# Patient Record
Sex: Female | Born: 1965 | Race: White | Hispanic: No | Smoking: Never smoker
Health system: Southern US, Community
[De-identification: ages and names within clinical notes are randomized; demographics above are authoritative.]

## PROBLEM LIST (undated history)

## (undated) DIAGNOSIS — F319 Bipolar disorder, unspecified: Secondary | ICD-10-CM

## (undated) DIAGNOSIS — M1990 Unspecified osteoarthritis, unspecified site: Secondary | ICD-10-CM

## (undated) DIAGNOSIS — R079 Chest pain, unspecified: Secondary | ICD-10-CM

## (undated) DIAGNOSIS — R519 Headache, unspecified: Secondary | ICD-10-CM

## (undated) DIAGNOSIS — R569 Unspecified convulsions: Secondary | ICD-10-CM

## (undated) DIAGNOSIS — F1910 Other psychoactive substance abuse, uncomplicated: Secondary | ICD-10-CM

## (undated) DIAGNOSIS — I10 Essential (primary) hypertension: Secondary | ICD-10-CM

## (undated) DIAGNOSIS — J45909 Unspecified asthma, uncomplicated: Secondary | ICD-10-CM

## (undated) DIAGNOSIS — R55 Syncope and collapse: Secondary | ICD-10-CM

## (undated) DIAGNOSIS — F32A Depression, unspecified: Secondary | ICD-10-CM

## (undated) DIAGNOSIS — K219 Gastro-esophageal reflux disease without esophagitis: Secondary | ICD-10-CM

## (undated) DIAGNOSIS — F419 Anxiety disorder, unspecified: Secondary | ICD-10-CM

## (undated) DIAGNOSIS — Z9289 Personal history of other medical treatment: Secondary | ICD-10-CM

## (undated) DIAGNOSIS — G4730 Sleep apnea, unspecified: Secondary | ICD-10-CM

## (undated) DIAGNOSIS — R7303 Prediabetes: Secondary | ICD-10-CM

## (undated) DIAGNOSIS — F329 Major depressive disorder, single episode, unspecified: Secondary | ICD-10-CM

## (undated) HISTORY — DX: Unspecified asthma, uncomplicated: J45.909

## (undated) HISTORY — PX: OTHER SURGICAL HISTORY: SHX169

## (undated) HISTORY — DX: Other psychoactive substance abuse, uncomplicated: F19.10

## (undated) HISTORY — PX: APPENDECTOMY: SHX54

## (undated) HISTORY — DX: Personal history of other medical treatment: Z92.89

## (undated) HISTORY — DX: Chest pain, unspecified: R07.9

## (undated) HISTORY — PX: PLANTAR FASCIA RELEASE: SHX2239

## (undated) HISTORY — DX: Unspecified osteoarthritis, unspecified site: M19.90

## (undated) HISTORY — PX: CARPAL TUNNEL RELEASE: SHX101

## (undated) HISTORY — PX: SPINE SURGERY: SHX786

## (undated) HISTORY — PX: ABDOMINAL HYSTERECTOMY: SHX81

---

## 1997-09-04 ENCOUNTER — Emergency Department (HOSPITAL_COMMUNITY): Admission: EM | Admit: 1997-09-04 | Discharge: 1997-09-04 | Payer: Self-pay | Admitting: Emergency Medicine

## 1997-11-15 ENCOUNTER — Emergency Department (HOSPITAL_COMMUNITY): Admission: EM | Admit: 1997-11-15 | Discharge: 1997-11-15 | Payer: Self-pay | Admitting: Emergency Medicine

## 1998-02-22 ENCOUNTER — Inpatient Hospital Stay (HOSPITAL_COMMUNITY): Admission: EM | Admit: 1998-02-22 | Discharge: 1998-02-23 | Payer: Self-pay | Admitting: Emergency Medicine

## 1998-08-07 ENCOUNTER — Emergency Department (HOSPITAL_COMMUNITY): Admission: EM | Admit: 1998-08-07 | Discharge: 1998-08-07 | Payer: Self-pay | Admitting: Emergency Medicine

## 1998-08-07 ENCOUNTER — Encounter: Payer: Self-pay | Admitting: *Deleted

## 1998-08-25 ENCOUNTER — Encounter: Payer: Self-pay | Admitting: Neurosurgery

## 1998-08-27 ENCOUNTER — Encounter: Payer: Self-pay | Admitting: Neurosurgery

## 1998-08-27 ENCOUNTER — Observation Stay (HOSPITAL_COMMUNITY): Admission: RE | Admit: 1998-08-27 | Discharge: 1998-08-28 | Payer: Self-pay | Admitting: Neurosurgery

## 1998-09-24 ENCOUNTER — Inpatient Hospital Stay (HOSPITAL_COMMUNITY): Admission: AD | Admit: 1998-09-24 | Discharge: 1998-09-29 | Payer: Self-pay | Admitting: Psychiatry

## 1999-06-22 ENCOUNTER — Encounter (INDEPENDENT_AMBULATORY_CARE_PROVIDER_SITE_OTHER): Payer: Self-pay | Admitting: Specialist

## 1999-06-22 ENCOUNTER — Encounter: Payer: Self-pay | Admitting: Emergency Medicine

## 1999-06-22 ENCOUNTER — Inpatient Hospital Stay (HOSPITAL_COMMUNITY): Admission: EM | Admit: 1999-06-22 | Discharge: 1999-06-25 | Payer: Self-pay | Admitting: Emergency Medicine

## 1999-07-22 ENCOUNTER — Encounter: Payer: Self-pay | Admitting: Family Medicine

## 1999-07-22 ENCOUNTER — Ambulatory Visit (HOSPITAL_COMMUNITY): Admission: RE | Admit: 1999-07-22 | Discharge: 1999-07-22 | Payer: Self-pay | Admitting: Family Medicine

## 1999-07-23 ENCOUNTER — Other Ambulatory Visit: Admission: RE | Admit: 1999-07-23 | Discharge: 1999-07-23 | Payer: Self-pay | Admitting: Obstetrics and Gynecology

## 1999-07-30 ENCOUNTER — Encounter: Admission: RE | Admit: 1999-07-30 | Discharge: 1999-07-30 | Payer: Self-pay | Admitting: Gastroenterology

## 1999-07-30 ENCOUNTER — Encounter: Payer: Self-pay | Admitting: Gastroenterology

## 1999-12-18 ENCOUNTER — Ambulatory Visit (HOSPITAL_COMMUNITY): Admission: RE | Admit: 1999-12-18 | Discharge: 1999-12-18 | Payer: Self-pay | Admitting: *Deleted

## 2000-02-25 ENCOUNTER — Encounter: Payer: Self-pay | Admitting: Emergency Medicine

## 2000-02-25 ENCOUNTER — Emergency Department (HOSPITAL_COMMUNITY): Admission: EM | Admit: 2000-02-25 | Discharge: 2000-02-25 | Payer: Self-pay | Admitting: Emergency Medicine

## 2000-06-17 ENCOUNTER — Encounter: Payer: Self-pay | Admitting: Emergency Medicine

## 2000-06-17 ENCOUNTER — Emergency Department (HOSPITAL_COMMUNITY): Admission: EM | Admit: 2000-06-17 | Discharge: 2000-06-17 | Payer: Self-pay | Admitting: Emergency Medicine

## 2000-09-24 ENCOUNTER — Emergency Department (HOSPITAL_COMMUNITY): Admission: EM | Admit: 2000-09-24 | Discharge: 2000-09-24 | Payer: Self-pay | Admitting: Emergency Medicine

## 2000-09-25 ENCOUNTER — Inpatient Hospital Stay (HOSPITAL_COMMUNITY): Admission: EM | Admit: 2000-09-25 | Discharge: 2000-10-05 | Payer: Self-pay | Admitting: *Deleted

## 2000-12-05 ENCOUNTER — Ambulatory Visit (HOSPITAL_BASED_OUTPATIENT_CLINIC_OR_DEPARTMENT_OTHER): Admission: RE | Admit: 2000-12-05 | Discharge: 2000-12-05 | Payer: Self-pay | Admitting: Family Medicine

## 2000-12-06 ENCOUNTER — Encounter: Payer: Self-pay | Admitting: Emergency Medicine

## 2000-12-06 ENCOUNTER — Emergency Department (HOSPITAL_COMMUNITY): Admission: EM | Admit: 2000-12-06 | Discharge: 2000-12-06 | Payer: Self-pay | Admitting: Emergency Medicine

## 2000-12-27 ENCOUNTER — Emergency Department (HOSPITAL_COMMUNITY): Admission: EM | Admit: 2000-12-27 | Discharge: 2000-12-27 | Payer: Self-pay | Admitting: Emergency Medicine

## 2000-12-27 ENCOUNTER — Encounter: Payer: Self-pay | Admitting: Emergency Medicine

## 2001-01-19 ENCOUNTER — Ambulatory Visit (HOSPITAL_COMMUNITY): Admission: RE | Admit: 2001-01-19 | Discharge: 2001-01-19 | Payer: Self-pay | Admitting: Cardiology

## 2001-01-26 ENCOUNTER — Encounter: Payer: Self-pay | Admitting: Emergency Medicine

## 2001-01-26 ENCOUNTER — Inpatient Hospital Stay (HOSPITAL_COMMUNITY): Admission: EM | Admit: 2001-01-26 | Discharge: 2001-01-27 | Payer: Self-pay | Admitting: Emergency Medicine

## 2001-02-01 ENCOUNTER — Encounter: Admission: RE | Admit: 2001-02-01 | Discharge: 2001-02-01 | Payer: Self-pay | Admitting: Sports Medicine

## 2001-02-15 ENCOUNTER — Other Ambulatory Visit: Admission: RE | Admit: 2001-02-15 | Discharge: 2001-02-15 | Payer: Self-pay | Admitting: Family Medicine

## 2001-02-22 ENCOUNTER — Ambulatory Visit (HOSPITAL_BASED_OUTPATIENT_CLINIC_OR_DEPARTMENT_OTHER): Admission: RE | Admit: 2001-02-22 | Discharge: 2001-02-22 | Payer: Self-pay | Admitting: *Deleted

## 2001-04-03 ENCOUNTER — Encounter: Payer: Self-pay | Admitting: Emergency Medicine

## 2001-04-03 ENCOUNTER — Emergency Department (HOSPITAL_COMMUNITY): Admission: EM | Admit: 2001-04-03 | Discharge: 2001-04-04 | Payer: Self-pay | Admitting: Emergency Medicine

## 2001-08-05 ENCOUNTER — Inpatient Hospital Stay (HOSPITAL_COMMUNITY): Admission: AD | Admit: 2001-08-05 | Discharge: 2001-08-22 | Payer: Self-pay | Admitting: *Deleted

## 2001-11-21 ENCOUNTER — Encounter: Payer: Self-pay | Admitting: Family Medicine

## 2001-11-21 ENCOUNTER — Encounter: Admission: RE | Admit: 2001-11-21 | Discharge: 2001-11-21 | Payer: Self-pay | Admitting: Family Medicine

## 2002-01-15 ENCOUNTER — Encounter: Payer: Self-pay | Admitting: Family Medicine

## 2002-01-15 ENCOUNTER — Encounter: Admission: RE | Admit: 2002-01-15 | Discharge: 2002-01-15 | Payer: Self-pay | Admitting: Family Medicine

## 2002-01-16 ENCOUNTER — Encounter: Payer: Self-pay | Admitting: Emergency Medicine

## 2002-01-16 ENCOUNTER — Emergency Department (HOSPITAL_COMMUNITY): Admission: EM | Admit: 2002-01-16 | Discharge: 2002-01-16 | Payer: Self-pay | Admitting: Emergency Medicine

## 2002-02-08 ENCOUNTER — Encounter: Admission: RE | Admit: 2002-02-08 | Discharge: 2002-02-08 | Payer: Self-pay | Admitting: Family Medicine

## 2002-02-08 ENCOUNTER — Encounter: Payer: Self-pay | Admitting: Family Medicine

## 2002-02-22 ENCOUNTER — Encounter: Admission: RE | Admit: 2002-02-22 | Discharge: 2002-02-22 | Payer: Self-pay | Admitting: Family Medicine

## 2002-02-22 ENCOUNTER — Encounter: Payer: Self-pay | Admitting: Family Medicine

## 2002-04-20 ENCOUNTER — Encounter: Admission: RE | Admit: 2002-04-20 | Discharge: 2002-04-20 | Payer: Self-pay | Admitting: Family Medicine

## 2002-04-20 ENCOUNTER — Encounter: Payer: Self-pay | Admitting: Family Medicine

## 2002-05-02 ENCOUNTER — Encounter: Admission: RE | Admit: 2002-05-02 | Discharge: 2002-05-08 | Payer: Self-pay | Admitting: Family Medicine

## 2002-11-05 ENCOUNTER — Encounter: Payer: Self-pay | Admitting: Orthopedic Surgery

## 2002-11-07 ENCOUNTER — Encounter: Payer: Self-pay | Admitting: Orthopedic Surgery

## 2002-11-07 ENCOUNTER — Inpatient Hospital Stay (HOSPITAL_COMMUNITY): Admission: RE | Admit: 2002-11-07 | Discharge: 2002-11-09 | Payer: Self-pay | Admitting: Orthopaedic Surgery

## 2002-12-28 ENCOUNTER — Emergency Department (HOSPITAL_COMMUNITY): Admission: EM | Admit: 2002-12-28 | Discharge: 2002-12-28 | Payer: Self-pay | Admitting: Emergency Medicine

## 2003-02-18 ENCOUNTER — Emergency Department (HOSPITAL_COMMUNITY): Admission: EM | Admit: 2003-02-18 | Discharge: 2003-02-18 | Payer: Self-pay | Admitting: Emergency Medicine

## 2003-03-05 ENCOUNTER — Encounter: Admission: RE | Admit: 2003-03-05 | Discharge: 2003-03-29 | Payer: Self-pay | Admitting: Orthopedic Surgery

## 2003-03-12 ENCOUNTER — Encounter: Admission: RE | Admit: 2003-03-12 | Discharge: 2003-03-12 | Payer: Self-pay | Admitting: Sports Medicine

## 2003-03-28 ENCOUNTER — Emergency Department (HOSPITAL_COMMUNITY): Admission: EM | Admit: 2003-03-28 | Discharge: 2003-03-28 | Payer: Self-pay | Admitting: Emergency Medicine

## 2003-04-09 ENCOUNTER — Encounter
Admission: RE | Admit: 2003-04-09 | Discharge: 2003-07-08 | Payer: Self-pay | Admitting: Physical Medicine and Rehabilitation

## 2003-06-08 ENCOUNTER — Inpatient Hospital Stay (HOSPITAL_COMMUNITY): Admission: EM | Admit: 2003-06-08 | Discharge: 2003-06-10 | Payer: Self-pay | Admitting: Emergency Medicine

## 2003-06-10 ENCOUNTER — Inpatient Hospital Stay (HOSPITAL_COMMUNITY): Admission: EM | Admit: 2003-06-10 | Discharge: 2003-06-18 | Payer: Self-pay | Admitting: Psychiatry

## 2003-06-13 ENCOUNTER — Encounter: Admission: RE | Admit: 2003-06-13 | Discharge: 2003-06-13 | Payer: Self-pay | Admitting: Family Medicine

## 2003-10-13 ENCOUNTER — Emergency Department (HOSPITAL_COMMUNITY): Admission: EM | Admit: 2003-10-13 | Discharge: 2003-10-13 | Payer: Self-pay | Admitting: *Deleted

## 2003-12-03 ENCOUNTER — Inpatient Hospital Stay (HOSPITAL_COMMUNITY): Admission: RE | Admit: 2003-12-03 | Discharge: 2003-12-12 | Payer: Self-pay | Admitting: Psychiatry

## 2004-01-17 ENCOUNTER — Ambulatory Visit: Payer: Self-pay | Admitting: Internal Medicine

## 2004-01-23 ENCOUNTER — Emergency Department (HOSPITAL_COMMUNITY): Admission: EM | Admit: 2004-01-23 | Discharge: 2004-01-23 | Payer: Self-pay | Admitting: Emergency Medicine

## 2004-06-15 ENCOUNTER — Inpatient Hospital Stay (HOSPITAL_COMMUNITY): Admission: EM | Admit: 2004-06-15 | Discharge: 2004-06-17 | Payer: Self-pay | Admitting: Emergency Medicine

## 2004-06-17 ENCOUNTER — Inpatient Hospital Stay (HOSPITAL_COMMUNITY): Admission: RE | Admit: 2004-06-17 | Discharge: 2004-06-24 | Payer: Self-pay | Admitting: Psychiatry

## 2004-06-17 ENCOUNTER — Ambulatory Visit: Payer: Self-pay | Admitting: Psychiatry

## 2004-08-26 ENCOUNTER — Emergency Department (HOSPITAL_COMMUNITY): Admission: EM | Admit: 2004-08-26 | Discharge: 2004-08-26 | Payer: Self-pay | Admitting: Emergency Medicine

## 2004-09-05 IMAGING — CT CT HEAD W/O CM
1 of 2 series · 13 of 30 positions shown, 17 images · non-contrast
Comparison: none

CLINICAL DATA: Syncope.  Altered level of consciousness.  
 COMPUTERIZED CRANIAL TOMOGRAPHY WITHOUT CONTRAST 06/08/03
 Routine noncontrast head CT was performed. 

 There is no evidence of intracranial hemorrhage, brain edema, or mass effect. The ventricles are normal. No extraaxial abnormalities are identified. Bone windows show no significant abnormalities.
 IMPRESSION
 Negative noncontrast head CT.

[Series 2: brain · axial · 0.47mm/px · z∈[+122,+244]mm · 13 of 28 slices shown, 17 images]
[im 2/28  brain]
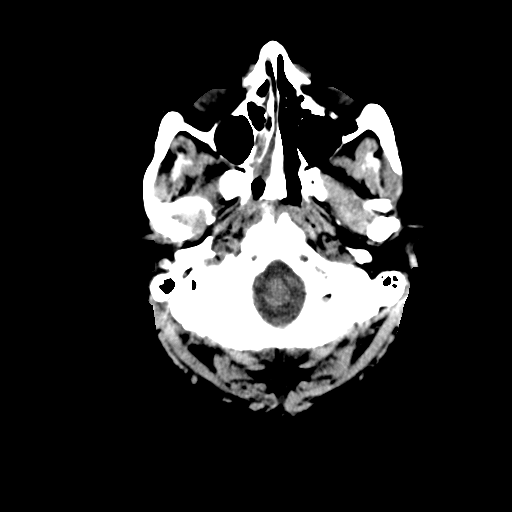
[im 2/28  bone]
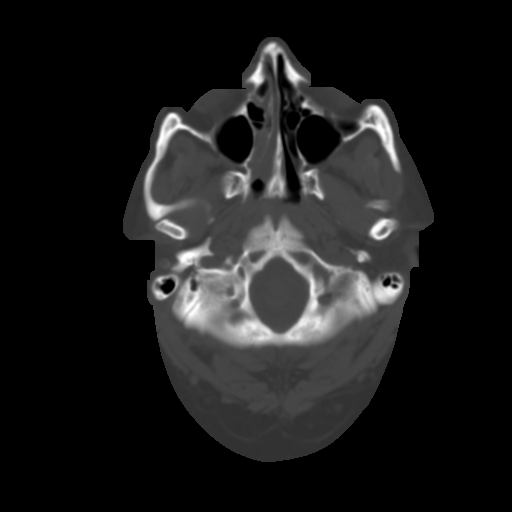
[im 4/28  brain]
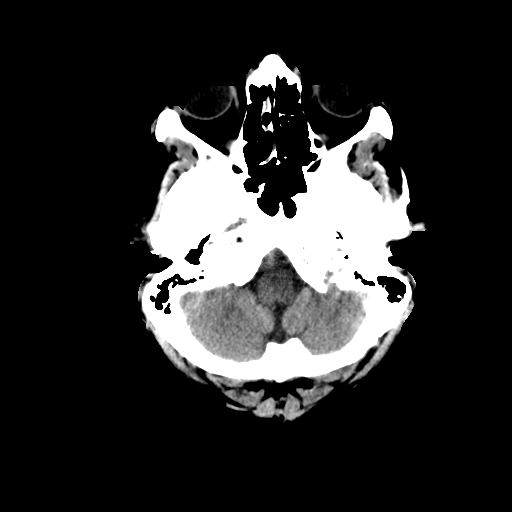
[im 6/28  brain]
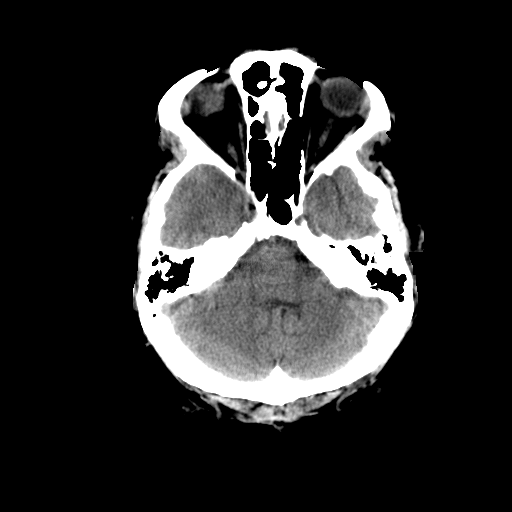
[im 8/28  brain]
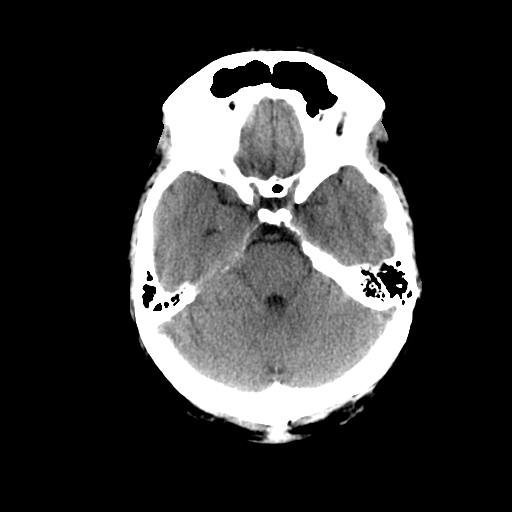
[im 10/28  brain]
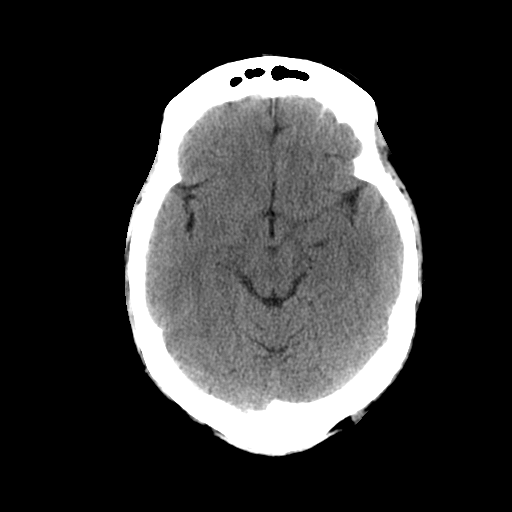
[im 10/28  bone]
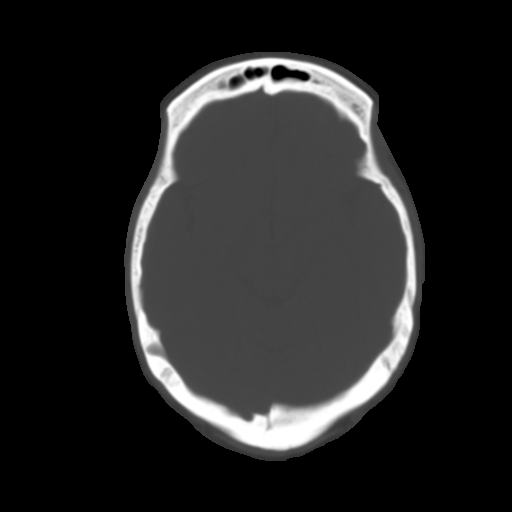
[im 12/28  brain]
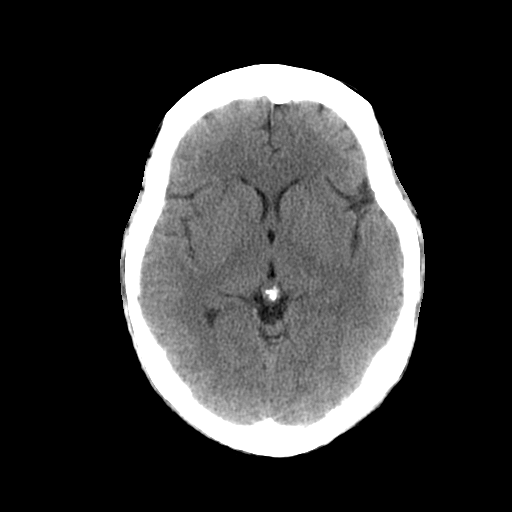
[im 14/28  brain]
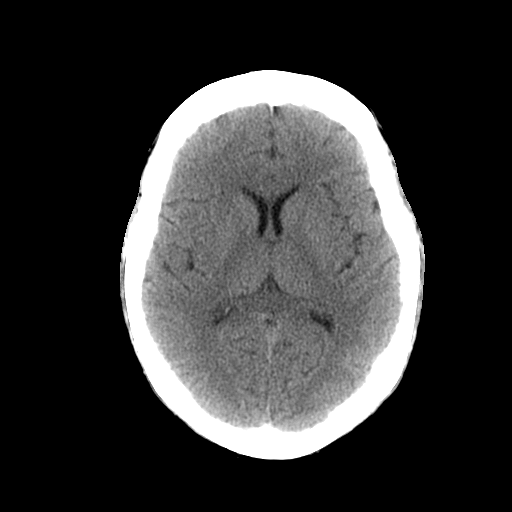
[im 16/28  brain]
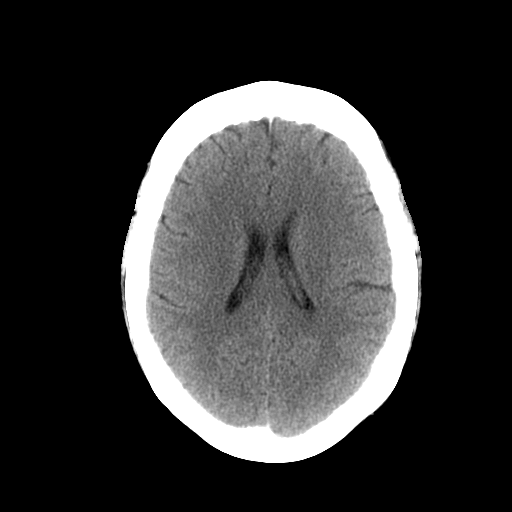
[im 18/28  brain]
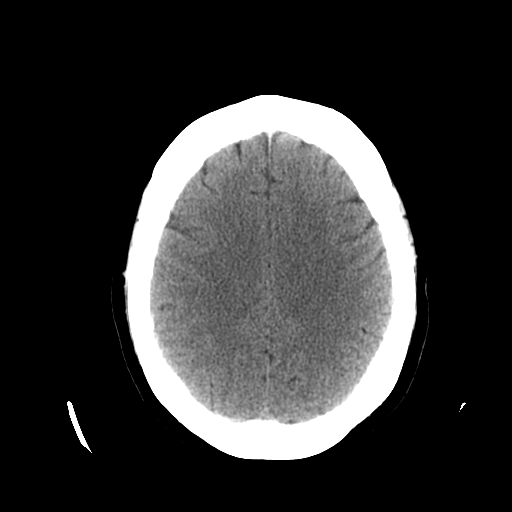
[im 18/28  bone]
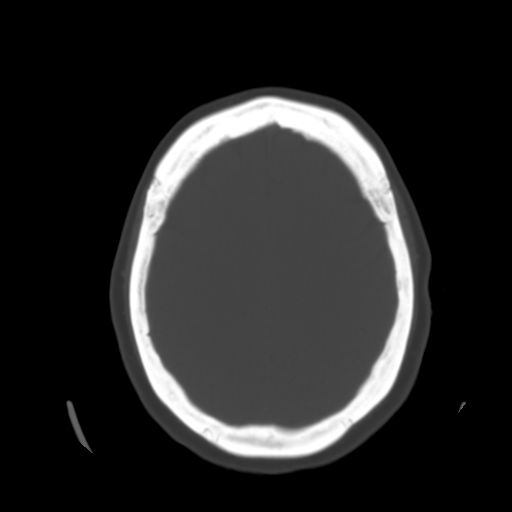
[im 20/28  brain]
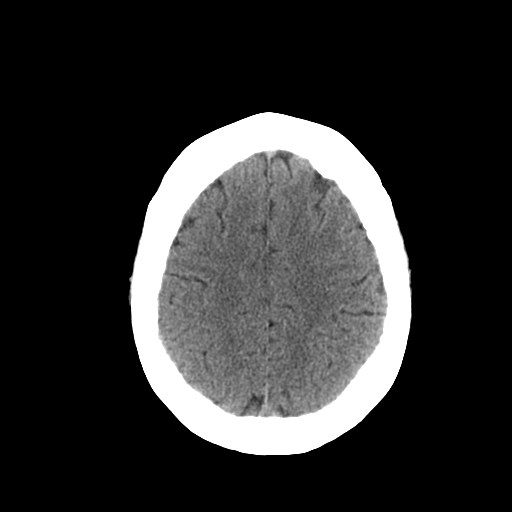
[im 22/28  brain]
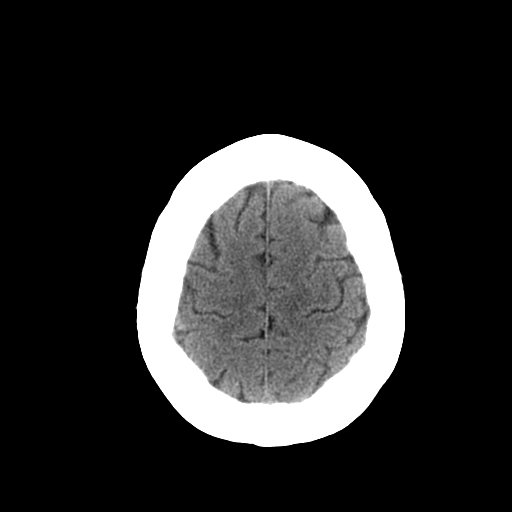
[im 24/28  brain]
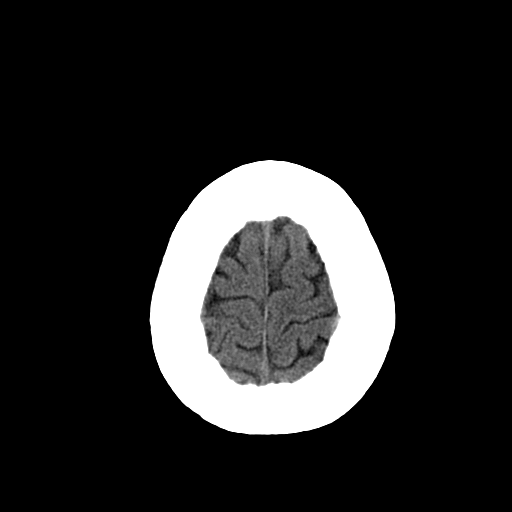
[im 26/28  brain]
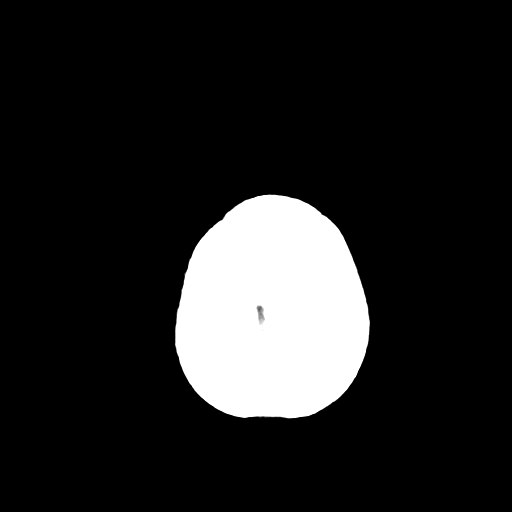
[im 26/28  bone]
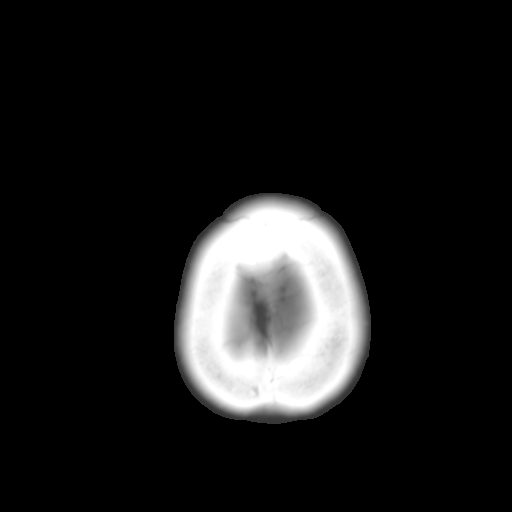

[13 of 30 positions shown; findings below may reference images not displayed]

## 2004-11-27 ENCOUNTER — Ambulatory Visit: Payer: Self-pay | Admitting: Internal Medicine

## 2004-12-10 ENCOUNTER — Ambulatory Visit: Payer: Self-pay | Admitting: Cardiology

## 2004-12-11 ENCOUNTER — Ambulatory Visit: Payer: Self-pay | Admitting: Internal Medicine

## 2005-01-05 ENCOUNTER — Ambulatory Visit: Payer: Self-pay | Admitting: Internal Medicine

## 2005-01-07 ENCOUNTER — Emergency Department (HOSPITAL_COMMUNITY): Admission: EM | Admit: 2005-01-07 | Discharge: 2005-01-07 | Payer: Self-pay | Admitting: Emergency Medicine

## 2005-01-07 ENCOUNTER — Ambulatory Visit (HOSPITAL_COMMUNITY): Admission: RE | Admit: 2005-01-07 | Discharge: 2005-01-07 | Payer: Self-pay | Admitting: Internal Medicine

## 2005-05-03 ENCOUNTER — Emergency Department (HOSPITAL_COMMUNITY): Admission: EM | Admit: 2005-05-03 | Discharge: 2005-05-03 | Payer: Self-pay | Admitting: Family Medicine

## 2005-05-25 ENCOUNTER — Emergency Department (HOSPITAL_COMMUNITY): Admission: EM | Admit: 2005-05-25 | Discharge: 2005-05-25 | Payer: Self-pay | Admitting: Emergency Medicine

## 2005-06-01 ENCOUNTER — Ambulatory Visit: Payer: Self-pay | Admitting: Gastroenterology

## 2005-06-08 ENCOUNTER — Encounter (INDEPENDENT_AMBULATORY_CARE_PROVIDER_SITE_OTHER): Payer: Self-pay | Admitting: Specialist

## 2005-06-08 ENCOUNTER — Ambulatory Visit: Payer: Self-pay | Admitting: Gastroenterology

## 2005-07-05 ENCOUNTER — Ambulatory Visit: Payer: Self-pay | Admitting: Gastroenterology

## 2005-07-17 ENCOUNTER — Inpatient Hospital Stay (HOSPITAL_COMMUNITY): Admission: EM | Admit: 2005-07-17 | Discharge: 2005-07-28 | Payer: Self-pay | Admitting: Psychiatry

## 2005-07-17 ENCOUNTER — Ambulatory Visit: Payer: Self-pay | Admitting: Psychiatry

## 2005-07-21 ENCOUNTER — Emergency Department (HOSPITAL_COMMUNITY): Admission: EM | Admit: 2005-07-21 | Discharge: 2005-07-21 | Payer: Self-pay | Admitting: Emergency Medicine

## 2005-10-03 ENCOUNTER — Emergency Department (HOSPITAL_COMMUNITY): Admission: EM | Admit: 2005-10-03 | Discharge: 2005-10-03 | Payer: Self-pay | Admitting: Emergency Medicine

## 2005-12-09 ENCOUNTER — Inpatient Hospital Stay (HOSPITAL_COMMUNITY): Admission: RE | Admit: 2005-12-09 | Discharge: 2005-12-13 | Payer: Self-pay | Admitting: Orthopaedic Surgery

## 2006-01-16 ENCOUNTER — Emergency Department (HOSPITAL_COMMUNITY): Admission: EM | Admit: 2006-01-16 | Discharge: 2006-01-16 | Payer: Self-pay | Admitting: Emergency Medicine

## 2006-01-18 ENCOUNTER — Inpatient Hospital Stay (HOSPITAL_COMMUNITY): Admission: EM | Admit: 2006-01-18 | Discharge: 2006-01-19 | Payer: Self-pay | Admitting: Psychiatry

## 2006-01-18 ENCOUNTER — Ambulatory Visit: Payer: Self-pay | Admitting: Psychiatry

## 2006-03-10 IMAGING — CT CT PARANASAL SINUSES LIMITED
1 of 2 series · 16 of 25 positions shown, 20 images · non-contrast
Comparison: none

CLINICAL DATA: Sinus congestion and nasal drainage.  Headaches.  Cough.
CT OF PARANASAL SINUSES:
TECHNIQUE: oronal CT images were obtained through the paranasal sinuses without intravenous contrast.
Minimal mucosal thickening is noted along the floors of both maxillary sinuses and involving several left anterior ethmoid air cells.  The sinuses are otherwise clear.  There is no evidence of air-fluid levels.  No bone abnormalities are seen involving the paranasal sinuses.

[Series 4: ltd sinus 3.0 h30s · axial · 0.29mm/px · z∈[-103,-9]mm · 16 of 24 slices shown, 20 images]
[im 2/24  brain]
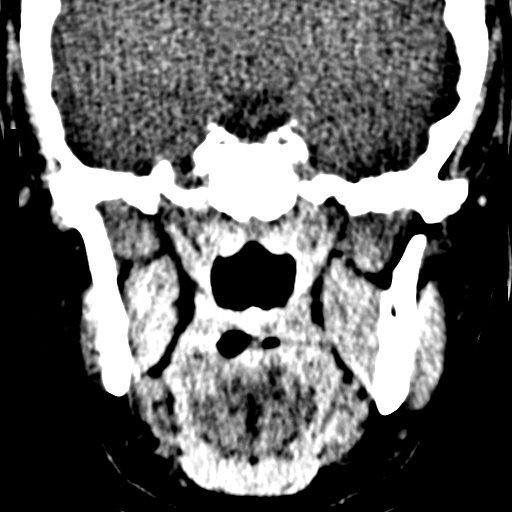
[im 2/24  bone]
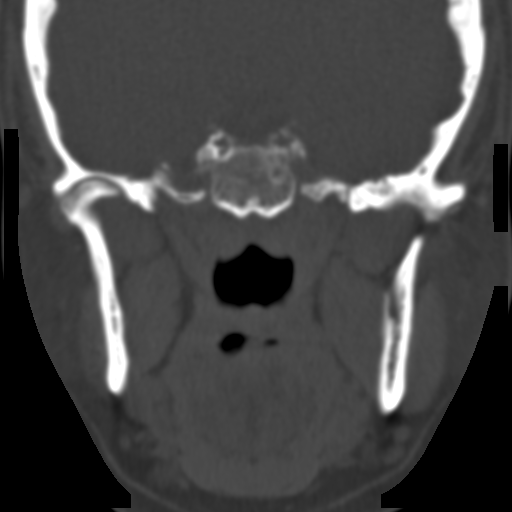
[im 4/24  bone]
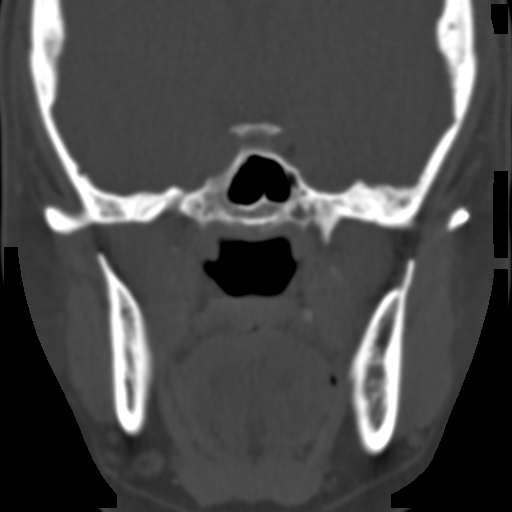
[im 5/24  bone]
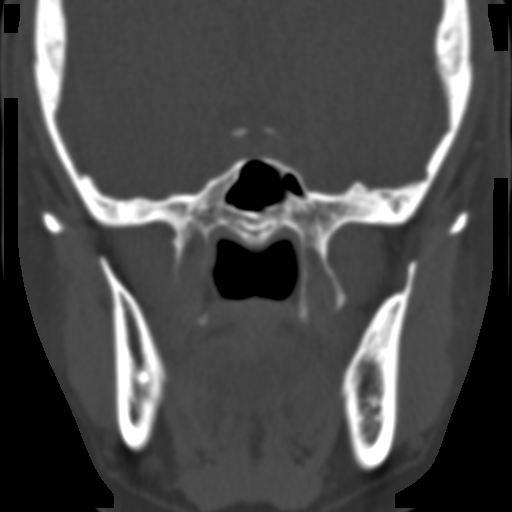
[im 6/24  bone]
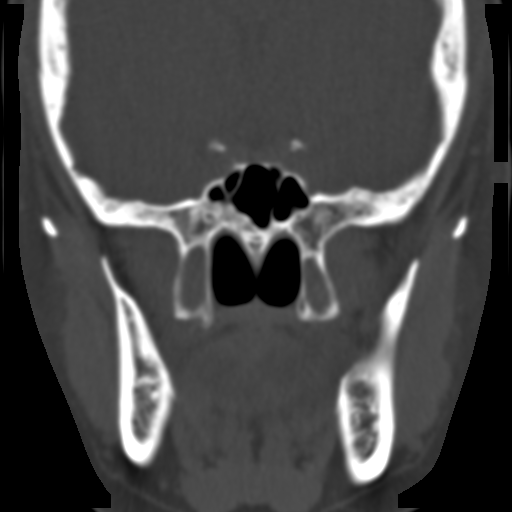
[im 8/24  brain]
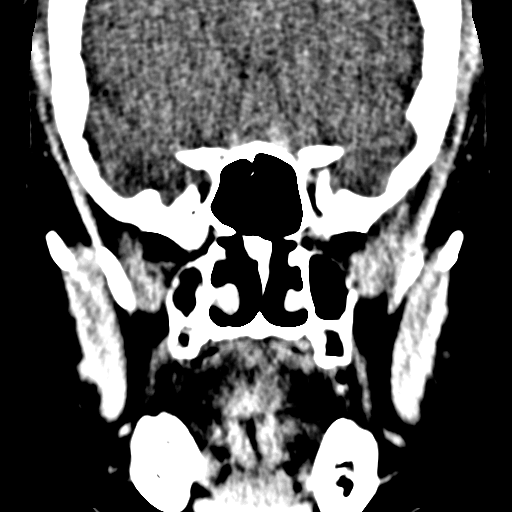
[im 8/24  bone]
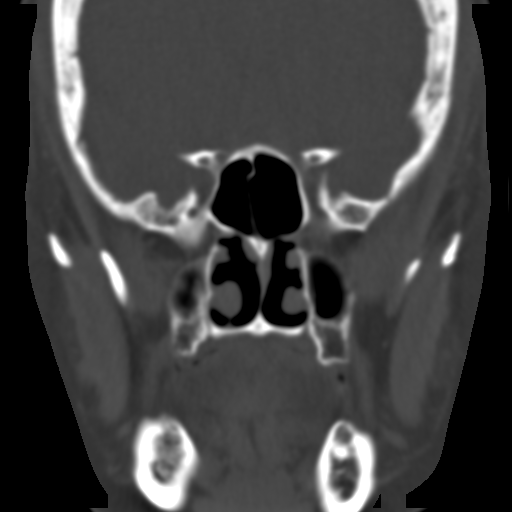
[im 9/24  bone]
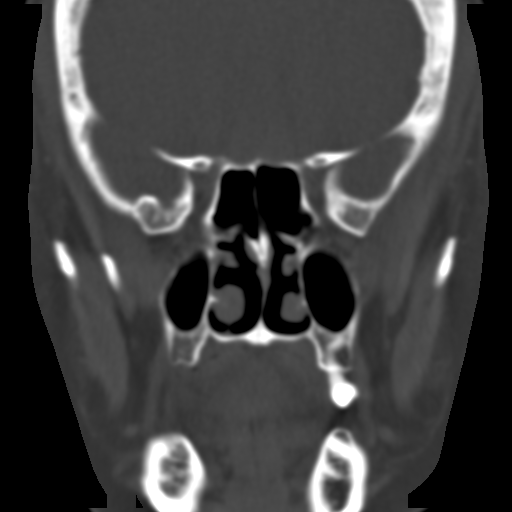
[im 10/24  bone]
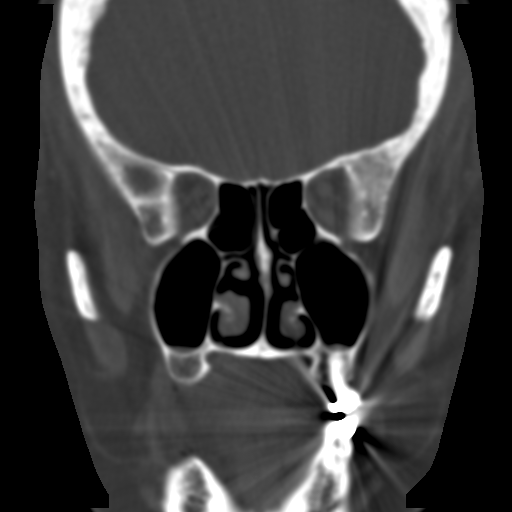
[im 12/24  bone]
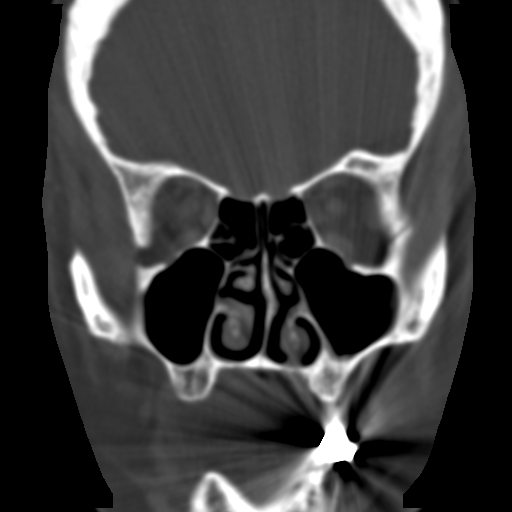
[im 13/24  brain]
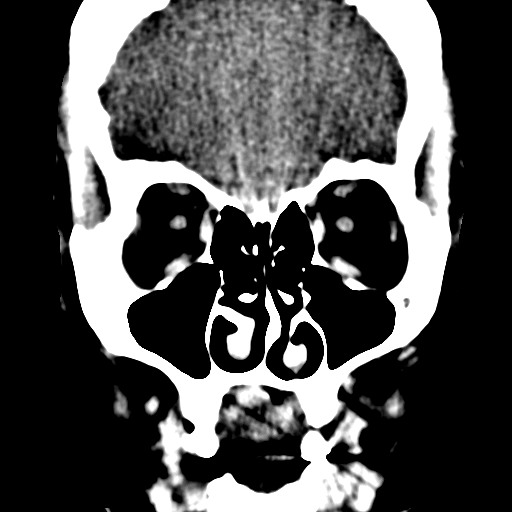
[im 13/24  bone]
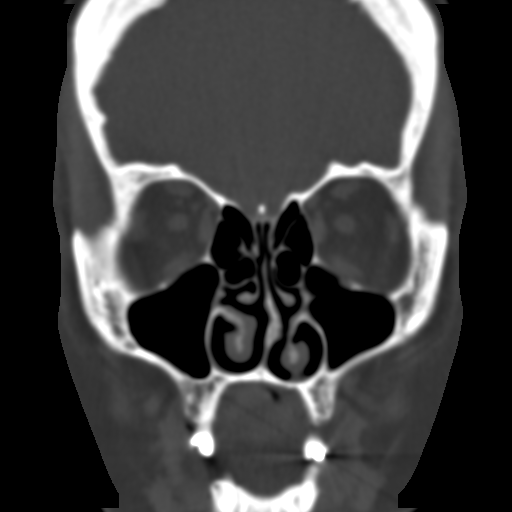
[im 15/24  bone]
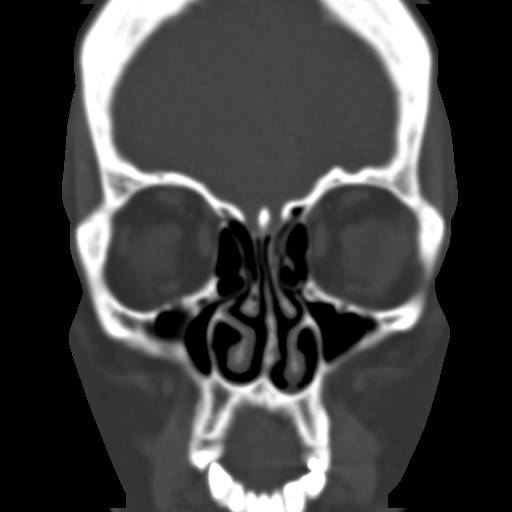
[im 16/24  bone]
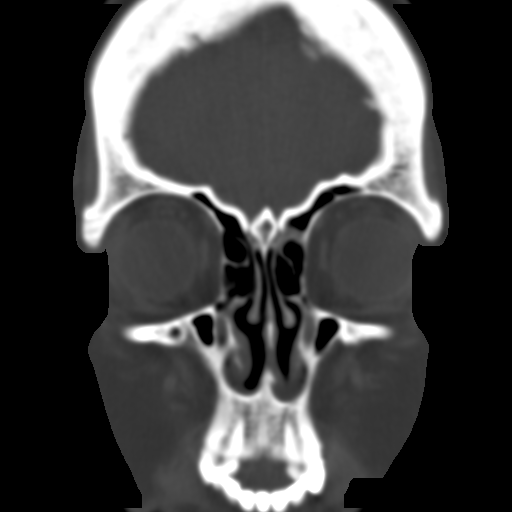
[im 17/24  bone]
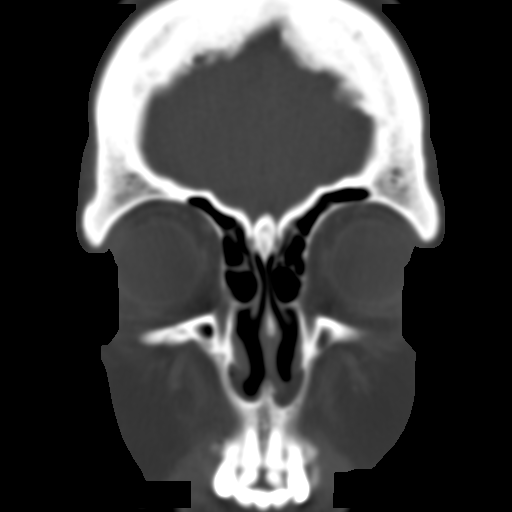
[im 19/24  brain]
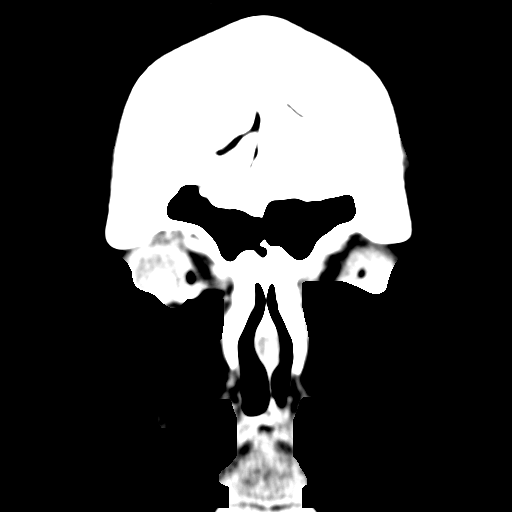
[im 19/24  bone]
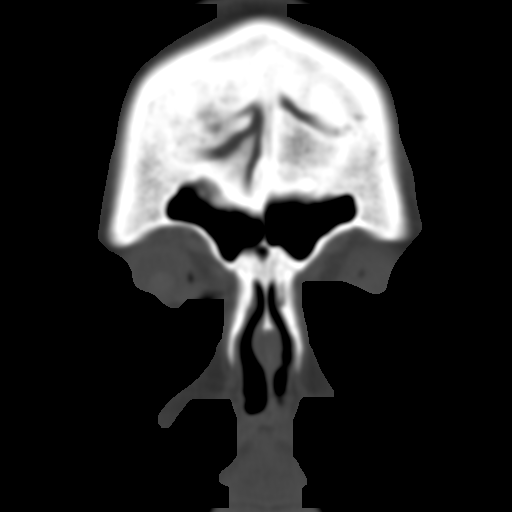
[im 20/24  bone]
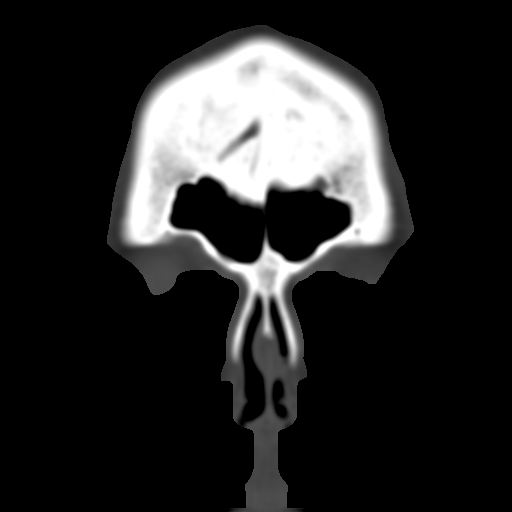
[im 21/24  bone]
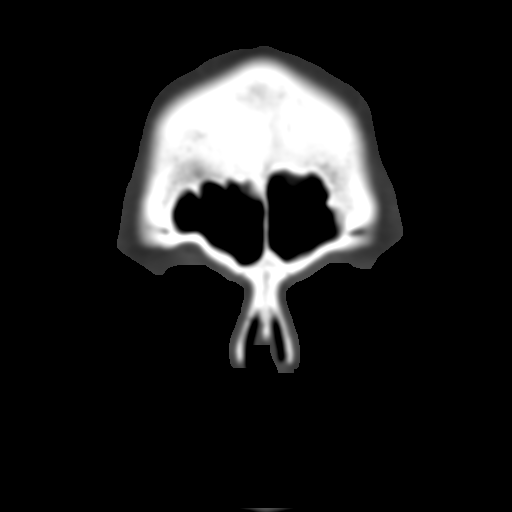
[im 23/24  bone]
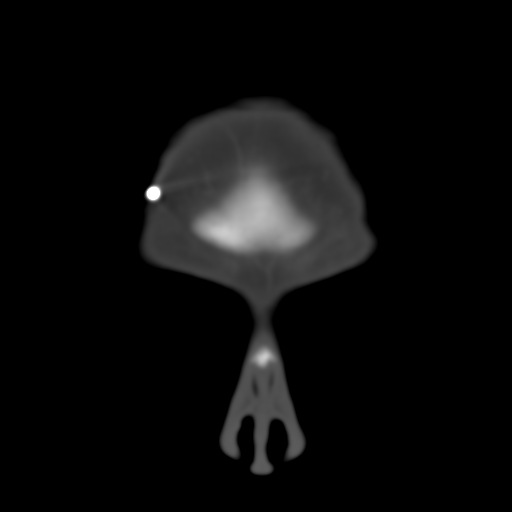

[16 of 25 positions shown; findings below may reference images not displayed]

IMPRESSION: Minimal mucosal thickening along the floors of both maxillary sinuses and involving several of the left anterior ethmoid air cells.

## 2006-03-13 ENCOUNTER — Emergency Department: Payer: Self-pay | Admitting: Emergency Medicine

## 2006-03-15 ENCOUNTER — Emergency Department (HOSPITAL_COMMUNITY): Admission: EM | Admit: 2006-03-15 | Discharge: 2006-03-15 | Payer: Self-pay | Admitting: Emergency Medicine

## 2006-03-18 ENCOUNTER — Inpatient Hospital Stay (HOSPITAL_COMMUNITY): Admission: EM | Admit: 2006-03-18 | Discharge: 2006-03-24 | Payer: Self-pay | Admitting: Emergency Medicine

## 2006-03-19 ENCOUNTER — Ambulatory Visit: Payer: Self-pay | Admitting: *Deleted

## 2006-06-10 ENCOUNTER — Emergency Department (HOSPITAL_COMMUNITY): Admission: EM | Admit: 2006-06-10 | Discharge: 2006-06-10 | Payer: Self-pay | Admitting: Emergency Medicine

## 2006-06-19 ENCOUNTER — Other Ambulatory Visit: Payer: Self-pay

## 2006-06-19 ENCOUNTER — Emergency Department: Payer: Self-pay | Admitting: Emergency Medicine

## 2006-06-29 ENCOUNTER — Emergency Department (HOSPITAL_COMMUNITY): Admission: EM | Admit: 2006-06-29 | Discharge: 2006-06-29 | Payer: Self-pay | Admitting: Emergency Medicine

## 2006-08-23 IMAGING — CR DG CHEST 1V PORT
1 series · 1 of 1 positions shown · non-contrast
Comparison: none

CLINICAL DATA: Chest pain for three hours.  Shortness of breath. 
 PORTABLE CHEST ? 1 VIEW ? 05/25/05 AT 7630 HOURS:

[view not recorded]
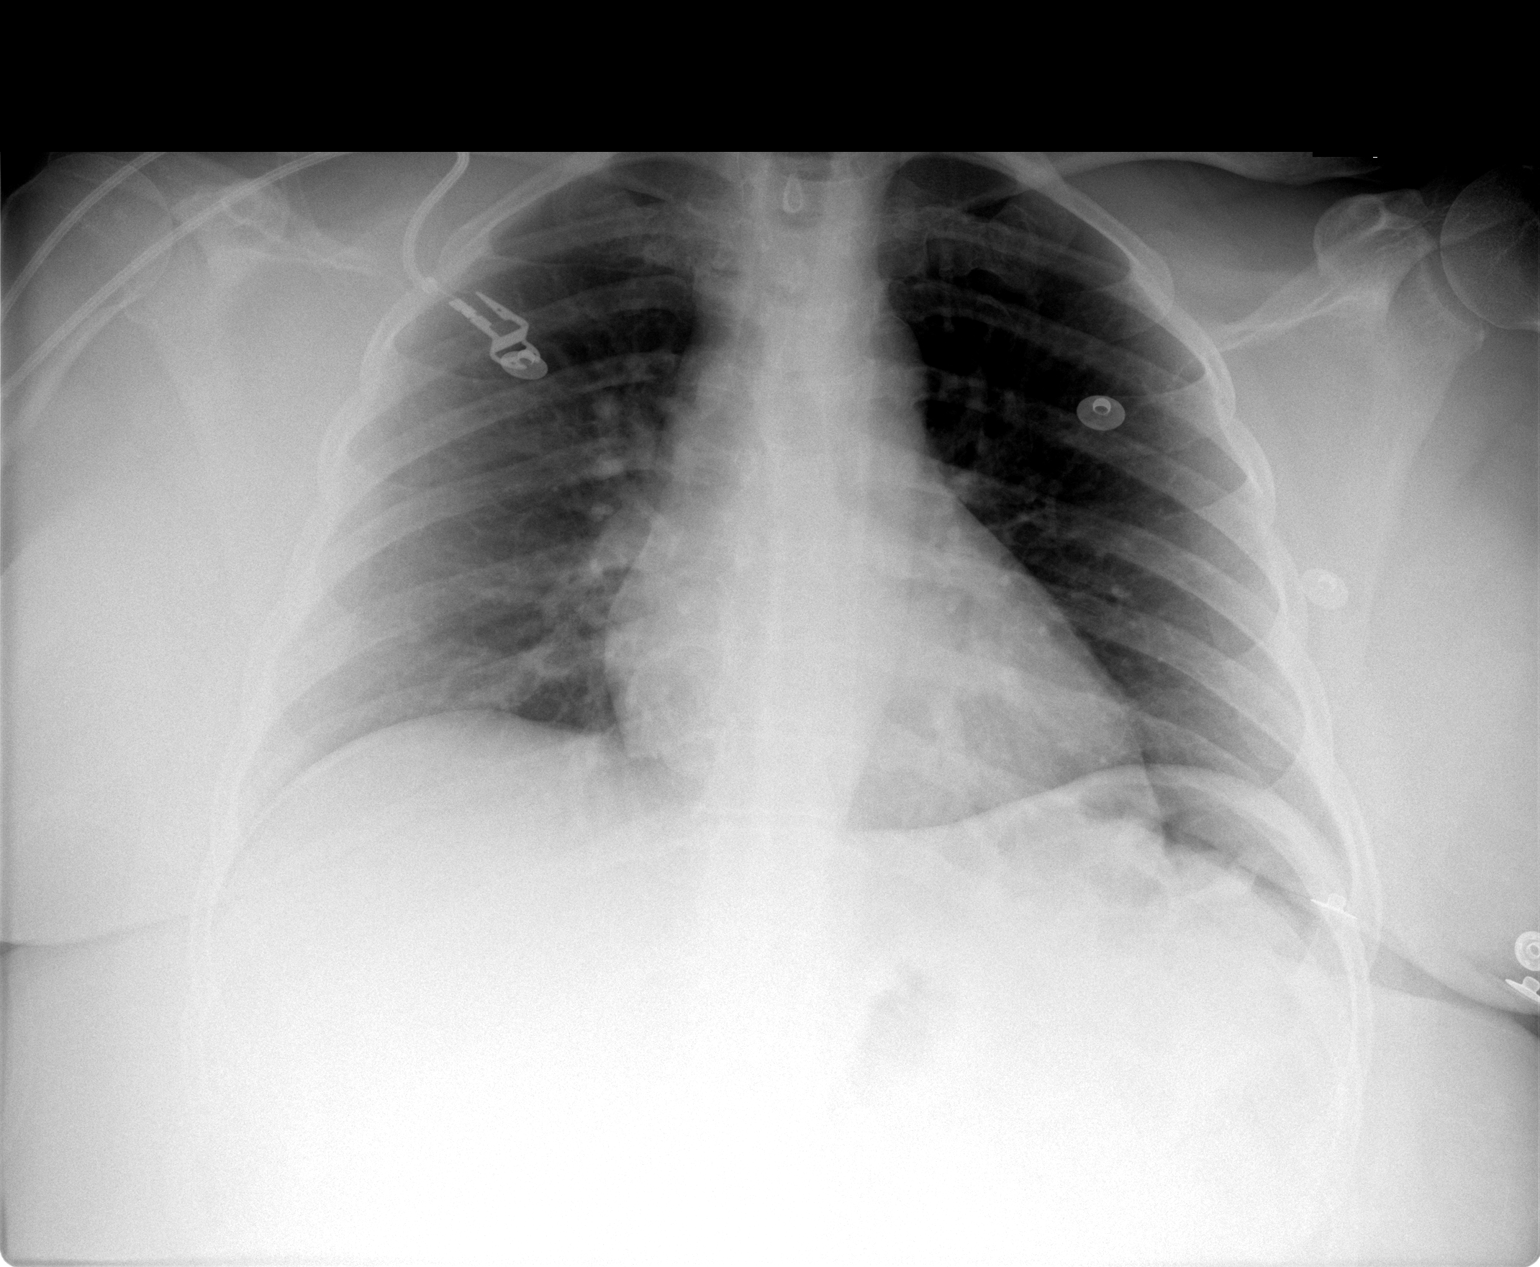

[1 of 1 positions shown; findings below may reference images not displayed]

FINDINGS: The heart size and mediastinal contours are within normal limits.  Both lungs are clear.
IMPRESSION: No acute disease.

## 2006-10-11 ENCOUNTER — Ambulatory Visit (HOSPITAL_COMMUNITY): Admission: RE | Admit: 2006-10-11 | Discharge: 2006-10-11 | Payer: Self-pay | Admitting: Family Medicine

## 2006-10-19 IMAGING — CR DG CHEST 1V PORT
1 series · 1 of 1 positions shown · non-contrast
Comparison: 06/08/03.

CLINICAL DATA: Chest pain.
 PORTABLE CHEST - 1 VIEW:

[view not recorded]
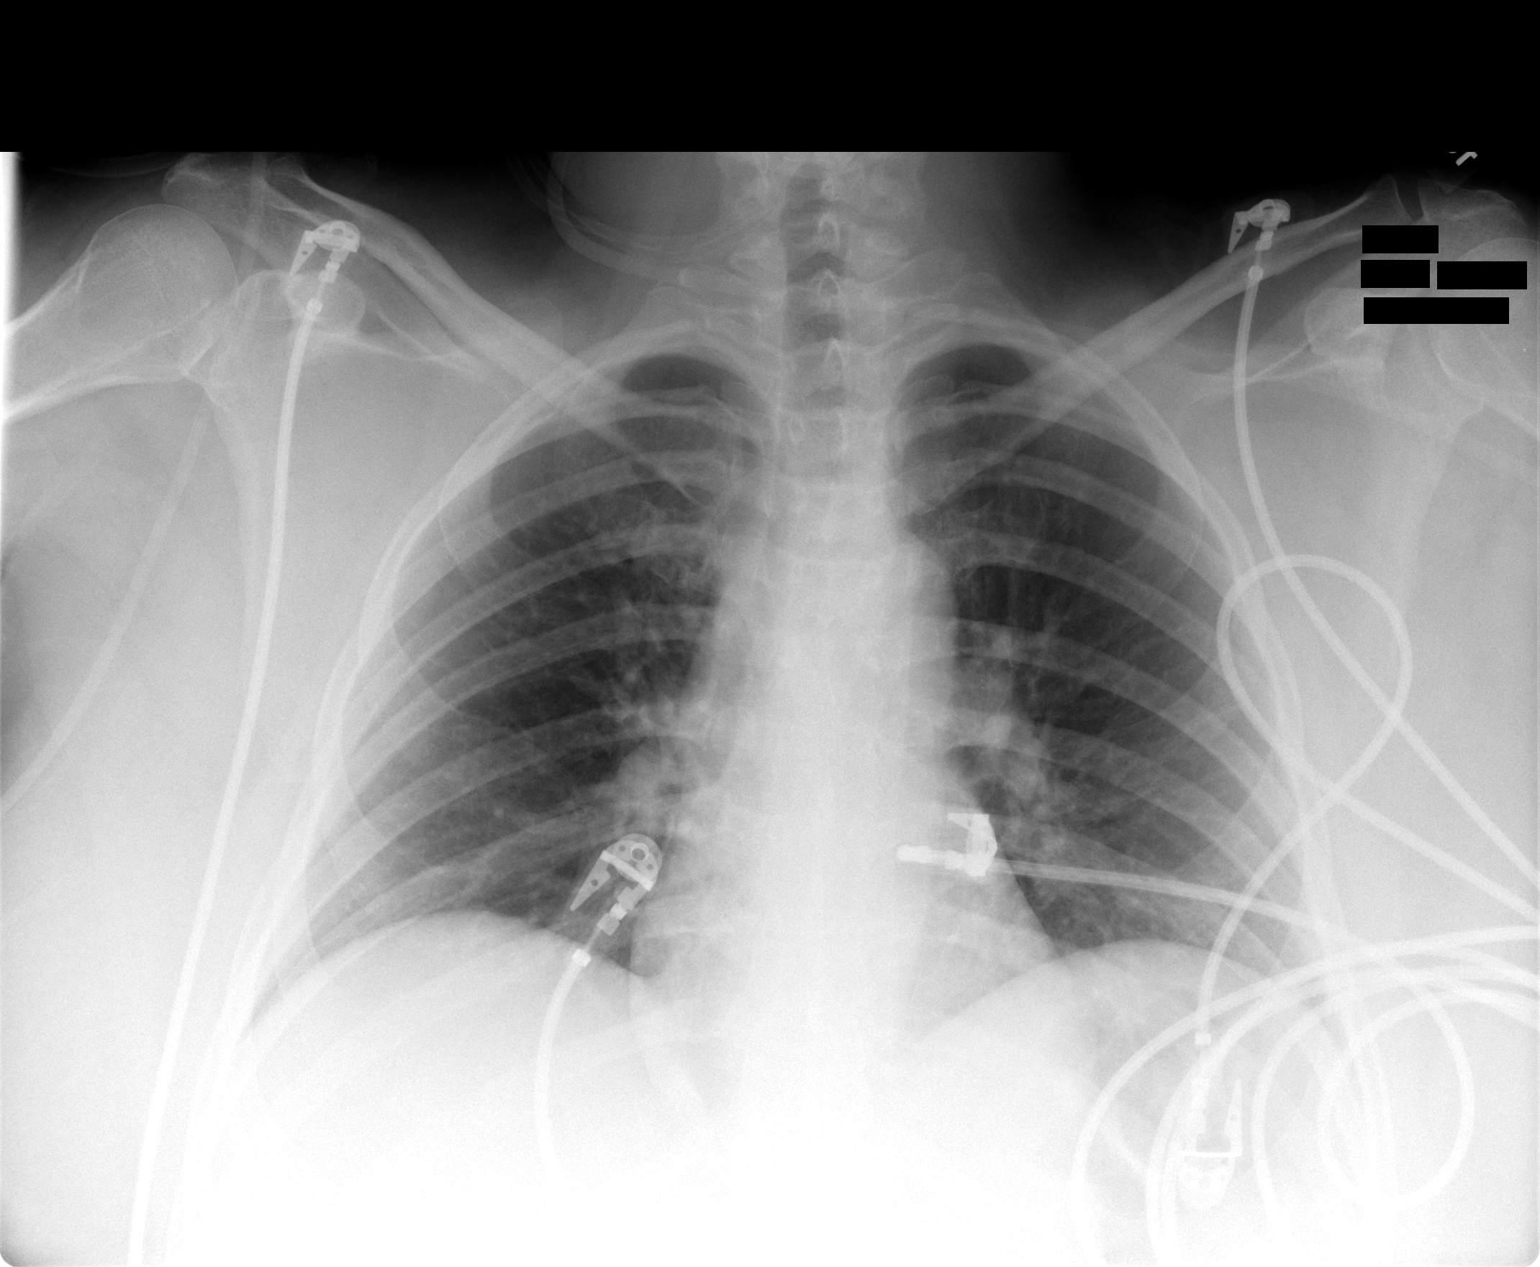

[1 of 1 positions shown; findings below may reference images not displayed]

FINDINGS: Mild peribronchial thickening is stable.  Cardiomediastinal silhouette is unremarkable.  Lungs are otherwise clear.  No pleural effusions or pneumothorax identified.
IMPRESSION: No evidence of acute cardiopulmonary disease.

## 2006-10-26 ENCOUNTER — Inpatient Hospital Stay (HOSPITAL_COMMUNITY): Admission: EM | Admit: 2006-10-26 | Discharge: 2006-10-29 | Payer: Self-pay | Admitting: Emergency Medicine

## 2006-11-02 ENCOUNTER — Ambulatory Visit (HOSPITAL_COMMUNITY): Admission: RE | Admit: 2006-11-02 | Discharge: 2006-11-02 | Payer: Self-pay | Admitting: Family Medicine

## 2006-11-04 ENCOUNTER — Ambulatory Visit (HOSPITAL_COMMUNITY): Admission: RE | Admit: 2006-11-04 | Discharge: 2006-11-04 | Payer: Self-pay | Admitting: Family Medicine

## 2006-11-04 ENCOUNTER — Emergency Department (HOSPITAL_COMMUNITY): Admission: EM | Admit: 2006-11-04 | Discharge: 2006-11-04 | Payer: Self-pay | Admitting: Emergency Medicine

## 2006-12-02 ENCOUNTER — Emergency Department (HOSPITAL_COMMUNITY): Admission: EM | Admit: 2006-12-02 | Discharge: 2006-12-02 | Payer: Self-pay | Admitting: Emergency Medicine

## 2006-12-03 ENCOUNTER — Emergency Department (HOSPITAL_COMMUNITY): Admission: EM | Admit: 2006-12-03 | Discharge: 2006-12-04 | Payer: Self-pay | Admitting: Emergency Medicine

## 2006-12-06 ENCOUNTER — Emergency Department (HOSPITAL_COMMUNITY): Admission: EM | Admit: 2006-12-06 | Discharge: 2006-12-06 | Payer: Self-pay | Admitting: Emergency Medicine

## 2006-12-06 ENCOUNTER — Other Ambulatory Visit: Payer: Self-pay | Admitting: Emergency Medicine

## 2006-12-07 ENCOUNTER — Ambulatory Visit: Payer: Self-pay | Admitting: *Deleted

## 2006-12-07 ENCOUNTER — Other Ambulatory Visit: Payer: Self-pay | Admitting: Emergency Medicine

## 2006-12-07 ENCOUNTER — Inpatient Hospital Stay (HOSPITAL_COMMUNITY): Admission: AD | Admit: 2006-12-07 | Discharge: 2006-12-15 | Payer: Self-pay | Admitting: *Deleted

## 2007-01-28 ENCOUNTER — Emergency Department (HOSPITAL_COMMUNITY): Admission: EM | Admit: 2007-01-28 | Discharge: 2007-01-28 | Payer: Self-pay | Admitting: Emergency Medicine

## 2007-02-02 ENCOUNTER — Inpatient Hospital Stay (HOSPITAL_COMMUNITY): Admission: EM | Admit: 2007-02-02 | Discharge: 2007-02-07 | Payer: Self-pay | Admitting: Emergency Medicine

## 2007-02-02 ENCOUNTER — Ambulatory Visit: Payer: Self-pay | Admitting: Cardiovascular Disease

## 2007-02-03 ENCOUNTER — Ambulatory Visit: Payer: Self-pay | Admitting: Vascular Surgery

## 2007-02-03 ENCOUNTER — Encounter (INDEPENDENT_AMBULATORY_CARE_PROVIDER_SITE_OTHER): Payer: Self-pay | Admitting: *Deleted

## 2007-02-16 ENCOUNTER — Emergency Department (HOSPITAL_COMMUNITY): Admission: EM | Admit: 2007-02-16 | Discharge: 2007-02-17 | Payer: Self-pay | Admitting: Emergency Medicine

## 2007-03-07 IMAGING — CR DG CHEST 2V
2 series · 2 of 2 positions shown · non-contrast
Comparison: 07/21/05.

CLINICAL DATA: Preoperative respiratory exam for spinal procedure.  Short of breath. Asthma.  Hypertension.
 CHEST - 2 VIEW:

[view not recorded (1 of 2)]
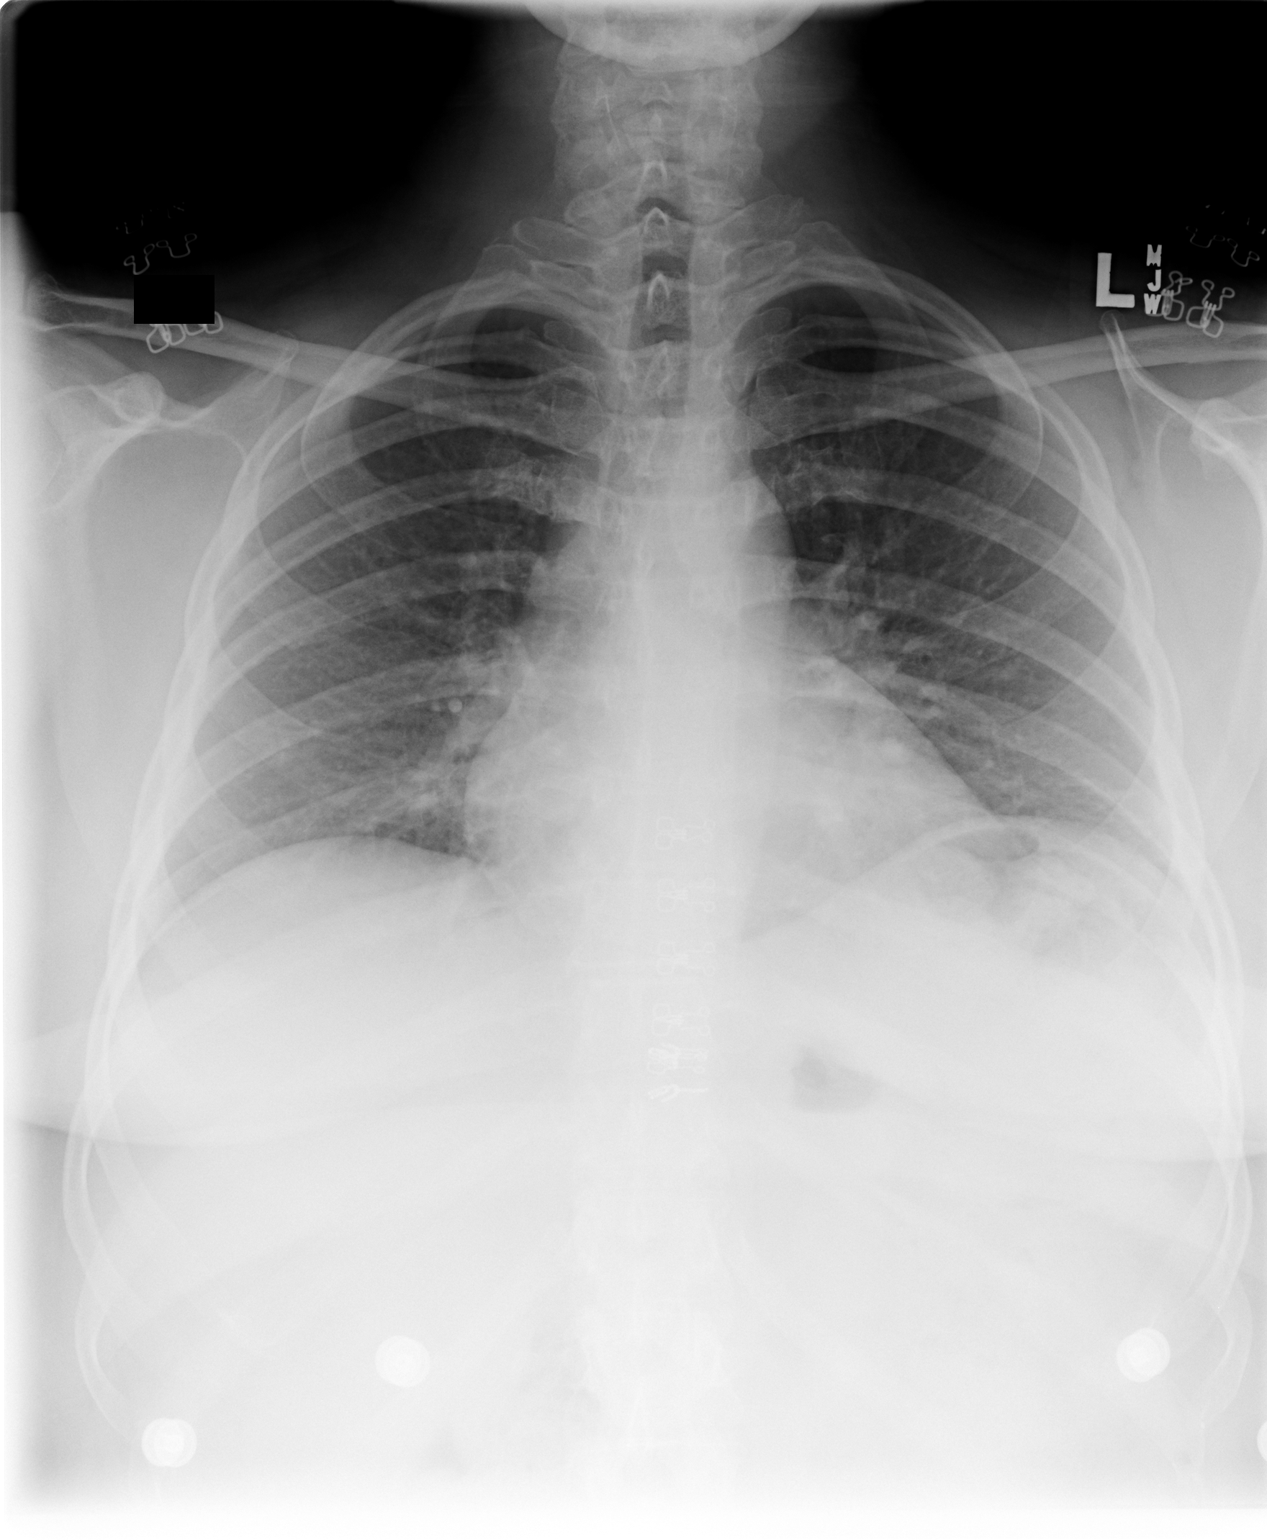

[view not recorded (2 of 2)]
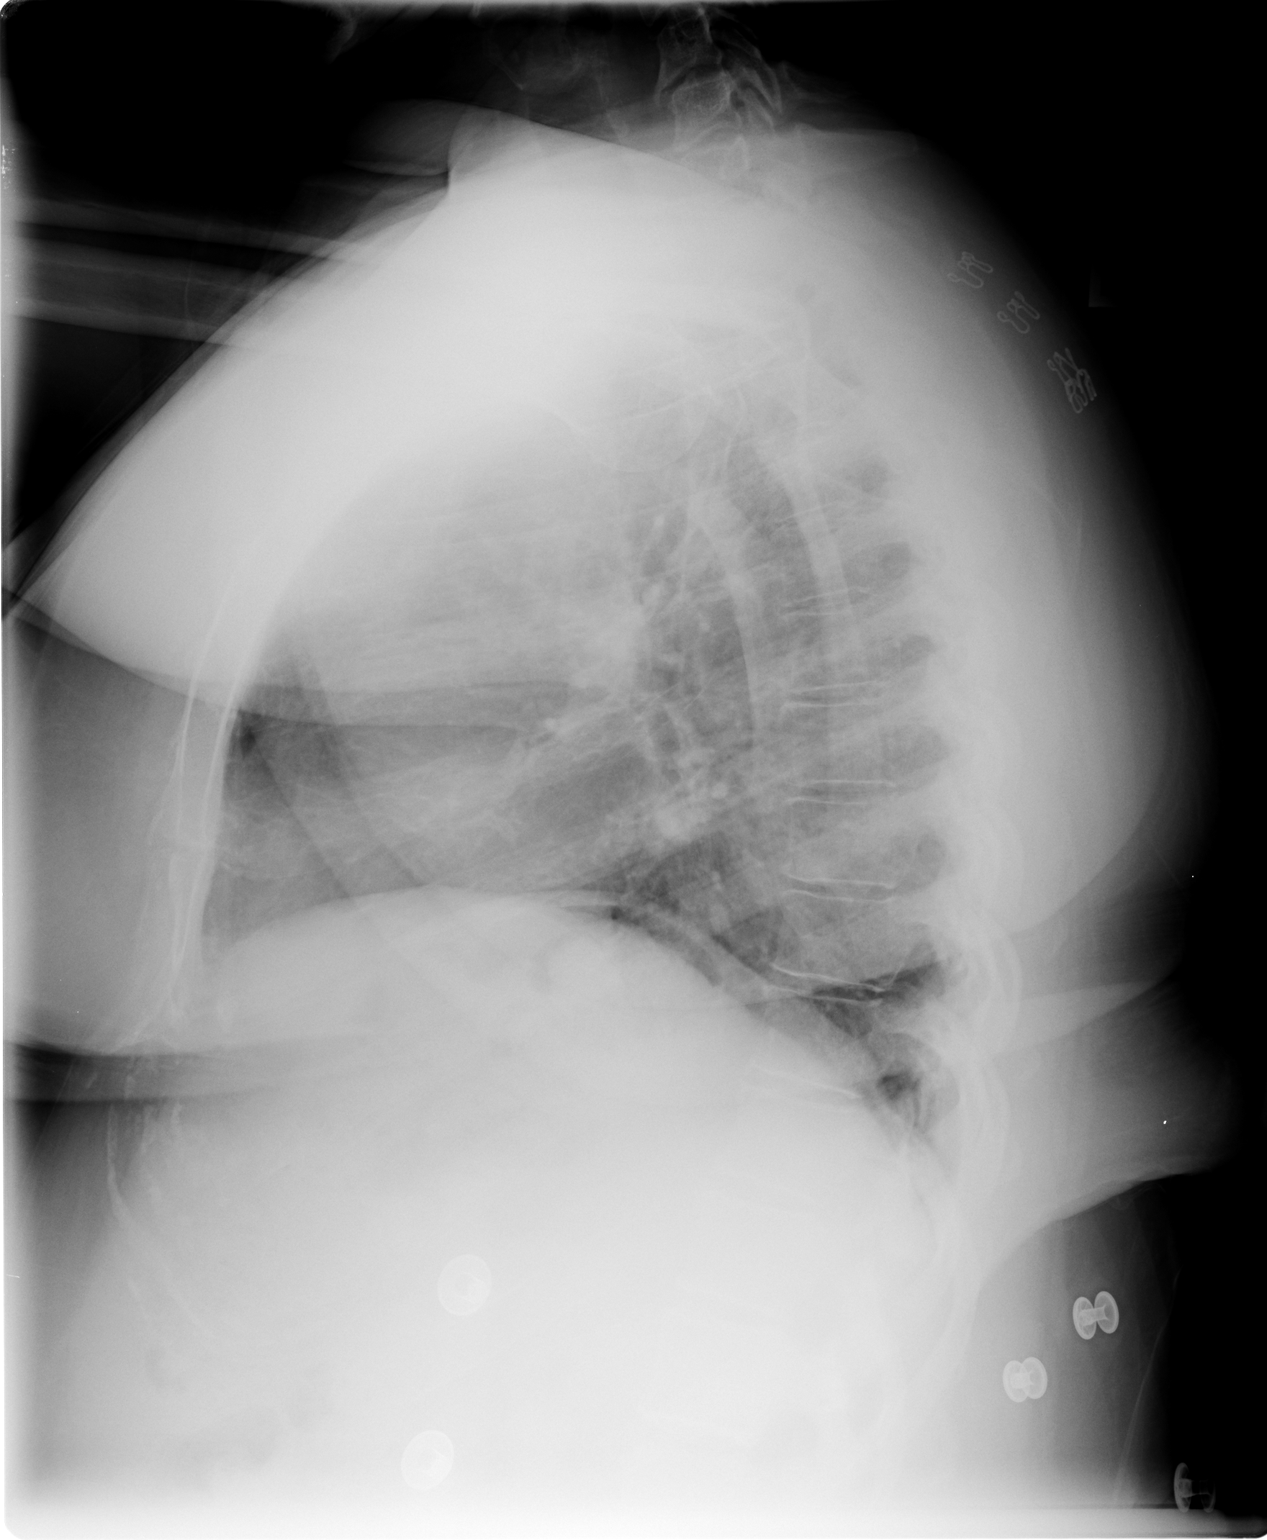

[2 of 2 positions shown; findings below may reference images not displayed]

The patient has taken a poor inspiration.  Allowing for this, the heart and mediastinum are normal and the lungs are clear.  No soft tissue or bony abnormality is seen.
IMPRESSION: Poor inspiration.  No active disease.

## 2007-03-09 IMAGING — CR DG LUMBAR SPINE 1V
1 series · 1 of 1 positions shown · non-contrast
Comparison: none

CLINICAL DATA: Spondylosis and spinal stenosis.  
 LUMBAR SPINE ? 1 VIEW:

[view not recorded]
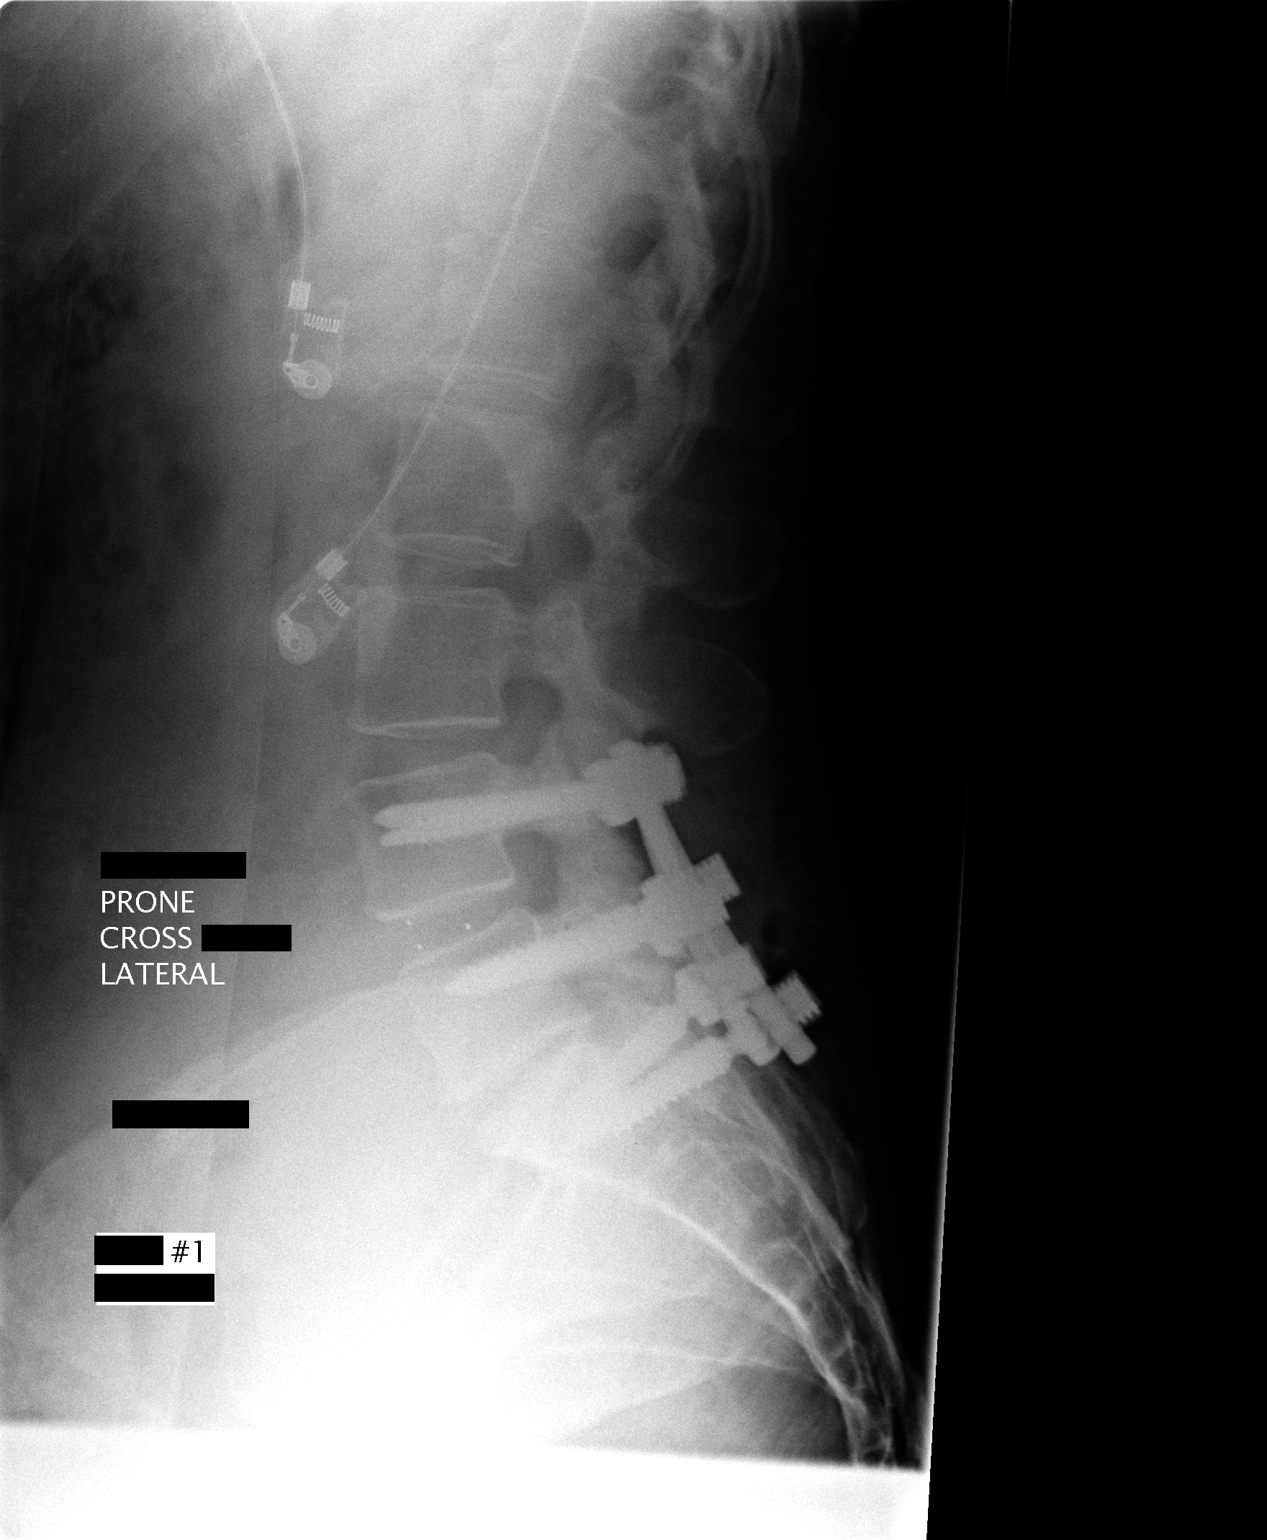

[1 of 1 positions shown; findings below may reference images not displayed]

FINDINGS: One view of the lumbar spine shows PLIF from L4 thru S1.  Cage material is seen within the L4-5 disk space.
IMPRESSION: Status post PLIF L4 thru S1.

## 2007-04-03 ENCOUNTER — Emergency Department (HOSPITAL_COMMUNITY): Admission: EM | Admit: 2007-04-03 | Discharge: 2007-04-03 | Payer: Self-pay | Admitting: Emergency Medicine

## 2007-04-10 ENCOUNTER — Emergency Department (HOSPITAL_COMMUNITY): Admission: EM | Admit: 2007-04-10 | Discharge: 2007-04-10 | Payer: Self-pay | Admitting: Emergency Medicine

## 2007-06-13 IMAGING — RF DG FLUORO GUIDE NDL PLC/BX
1 series · 1 of 1 positions shown · non-contrast
Comparison: none

CLINICAL DATA: 40 year old female; sudden onset of headache.  Negative heads CT.  Rule out subarachnoid hemorrhage.
 FLUOROSCOPIC-GUIDED LUMBAR PUNCTURE ? 03/15/06: 
 Operator:  Dr. Quirijn Amazigh. 
 Procedure:  After obtaining informed written consent the patient was brought to the fluoroscopy suite.  She was placed on the table in the prone position.  The patient is status post posterior pedicle screw and rod fixation beginning at L4.  The L1-2 interspace was localized.  The skin was prepped and draped in the usual sterile fashion.  Superficial soft tissues were anesthetized with 1% lidocaine just to the right of the patient?s healing midline incision.  A 20 gauge spinal needle was then advanced into the subarachnoid space under direct fluoroscopic guidance.  A right paramedian approach was used.  Clear CSF was returned.  Opening pressure was 18 cm of water, within normal limits.  
 Approximately 7 cc of clear CSF was then collected.  There was no gross xanthochromia.  Laboratory results are pending.
 The stylet was replaced and the needle removed.  The patient was given standard post-lumbar puncture orders.

[Series 1: run · 1 of 1 slices shown]
[im 1/1]
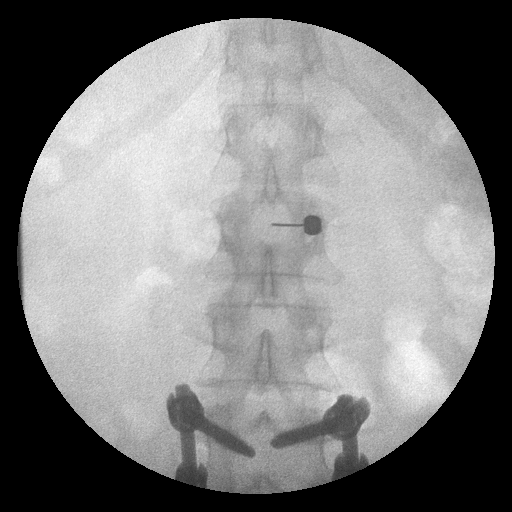

[1 of 1 positions shown; findings below may reference images not displayed]

IMPRESSION: Technically successful lumbar puncture at L1-2 interspace.  CSF was grossly clear.
 This was discussed with Dr. Badiel at the time of the exam.

## 2007-06-13 IMAGING — CT CT HEAD W/O CM
1 series · 16 of 30 positions shown, 20 images · non-contrast
Comparison: none

CLINICAL DATA: 40 year old female; head pain, medical clearance.
 HEAD CT WITHOUT CONTRAST ? 03/15/06:
TECHNIQUE: Contiguous axial CT images were obtained from the base of the skull through the vertex according to standard protocol without contrast.

[Series 2: head_seq 4.5 h45s st · axial · 0.43mm/px · z∈[-135,+9]mm · 16 of 36 slices shown, 20 images]
[im 2/36  brain]
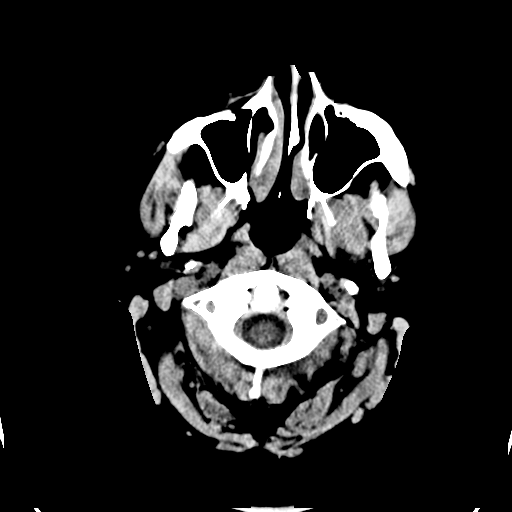
[im 2/36  bone]
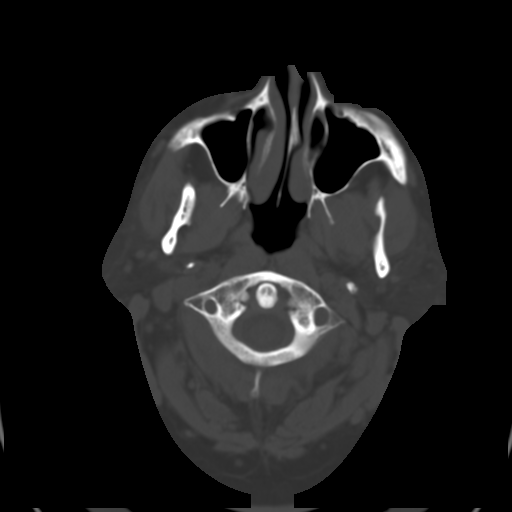
[im 4/36  brain]
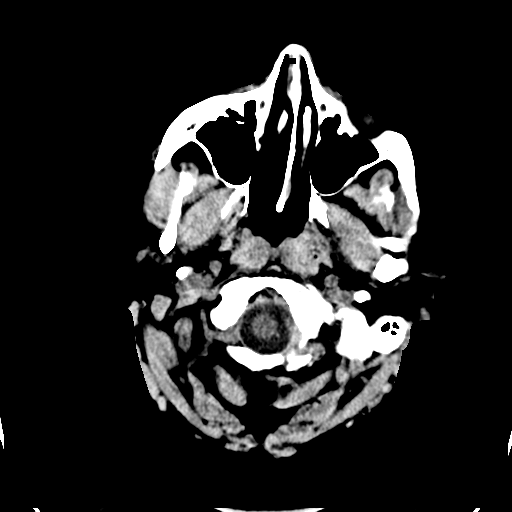
[im 7/36  brain]
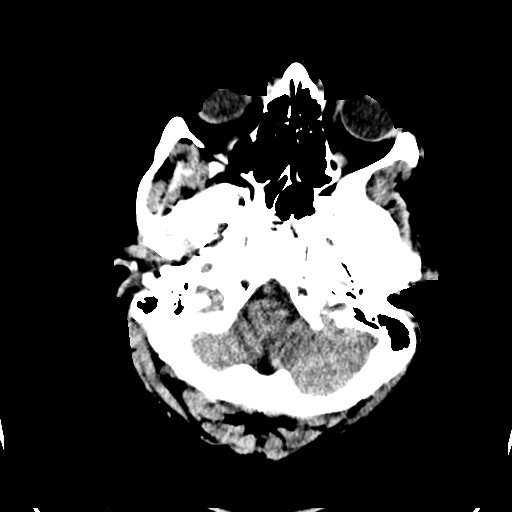
[im 9/36  brain]
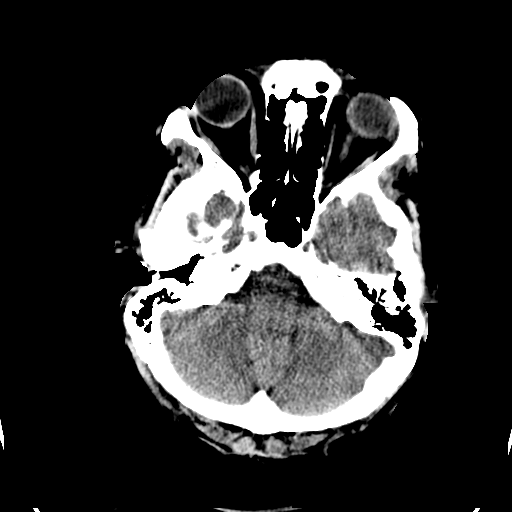
[im 10/36  brain]
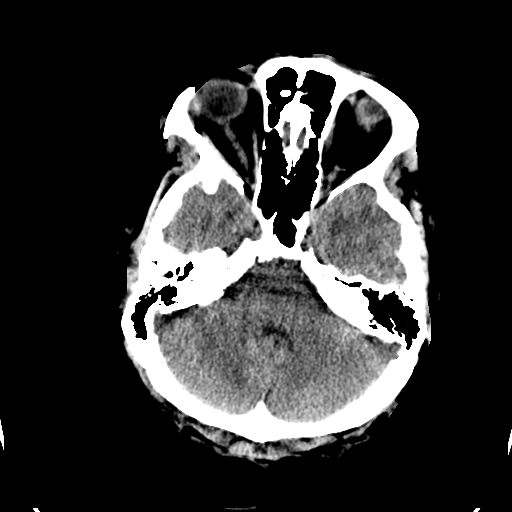
[im 10/36  bone]
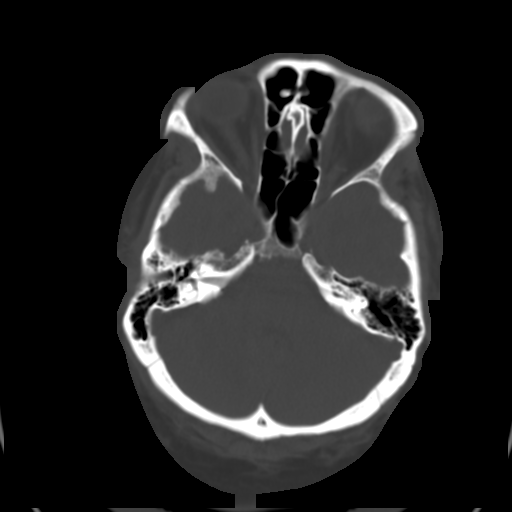
[im 13/36  brain]
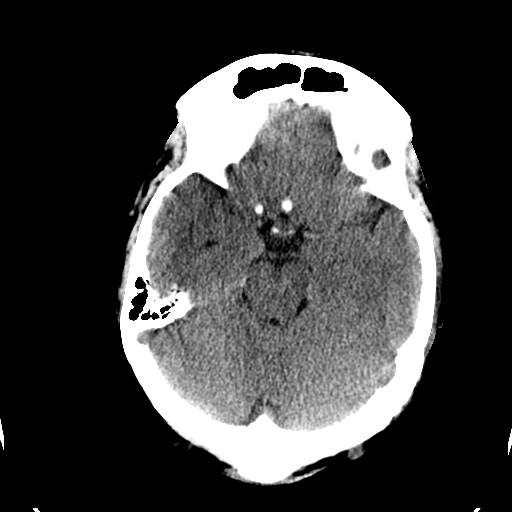
[im 15/36  brain]
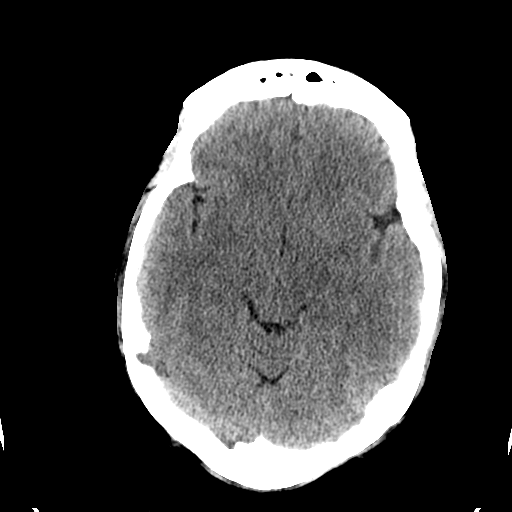
[im 17/36  brain]
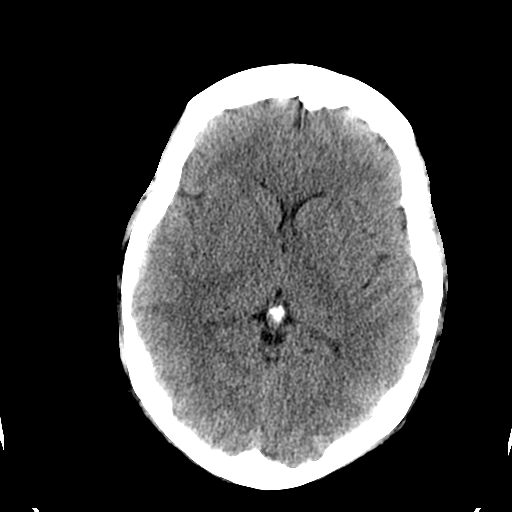
[im 19/36  brain]
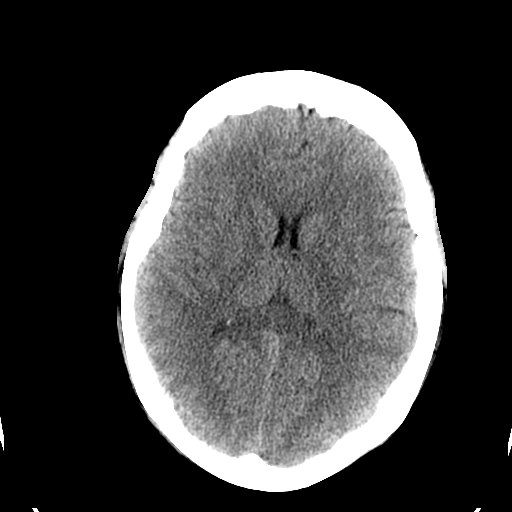
[im 19/36  bone]
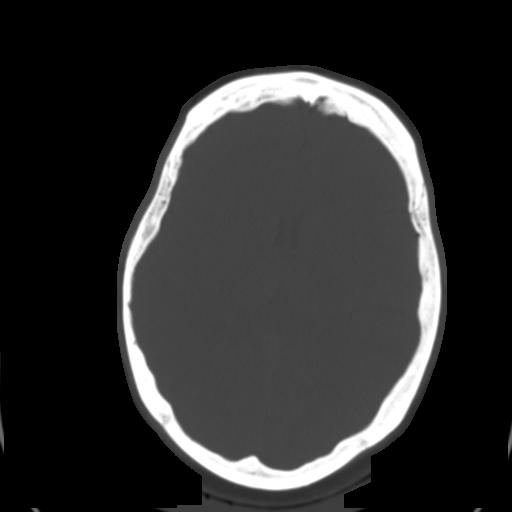
[im 21/36  brain]
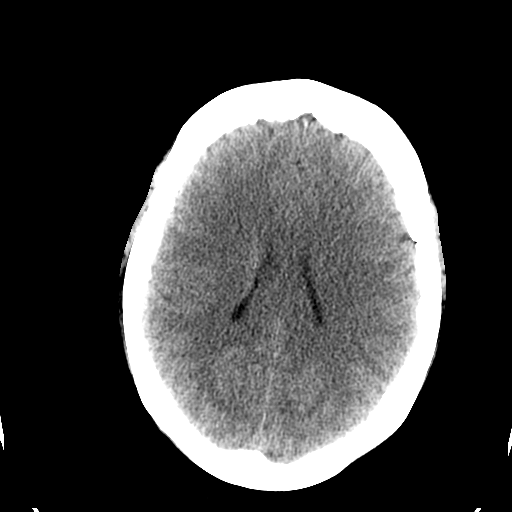
[im 23/36  brain]
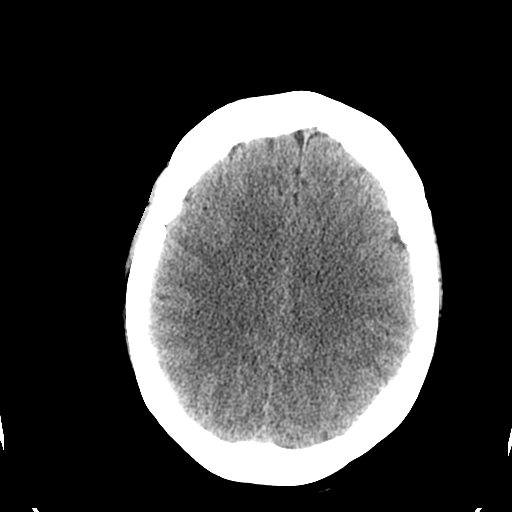
[im 26/36  brain]
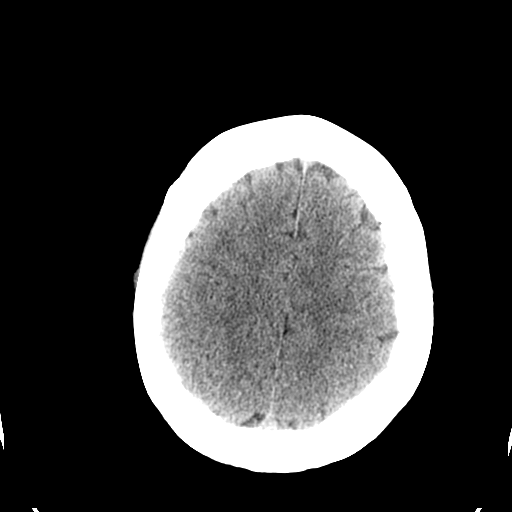
[im 27/36  brain]
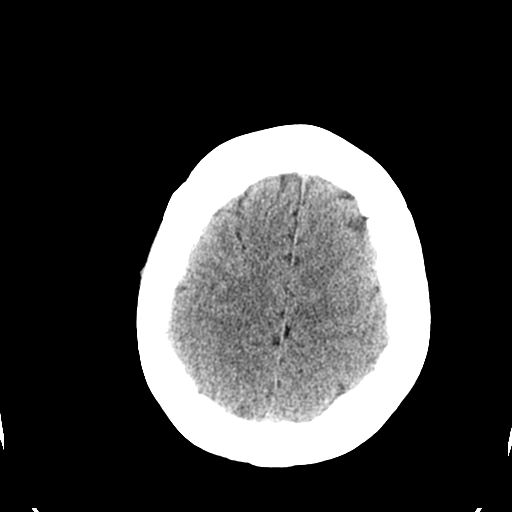
[im 27/36  bone]
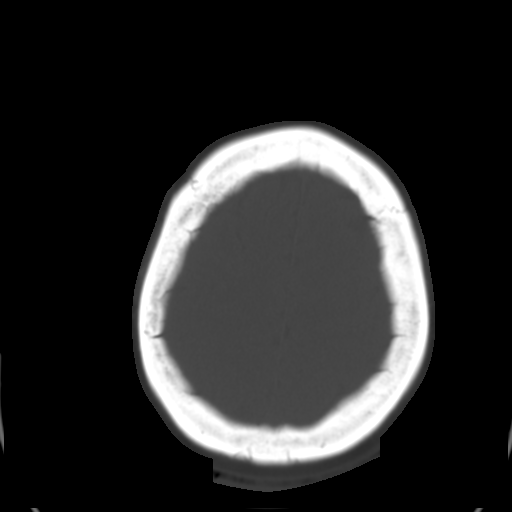
[im 29/36  brain]
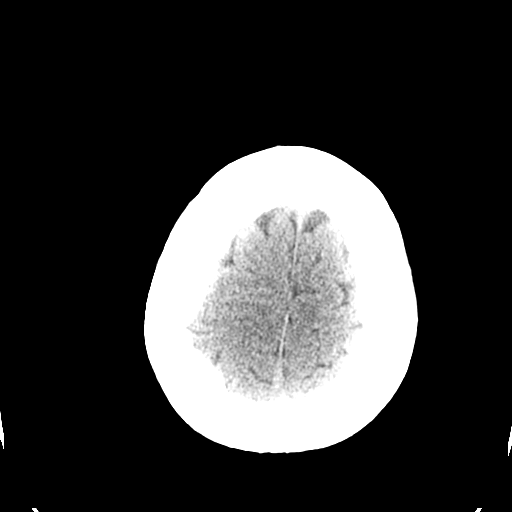
[im 32/36  brain]
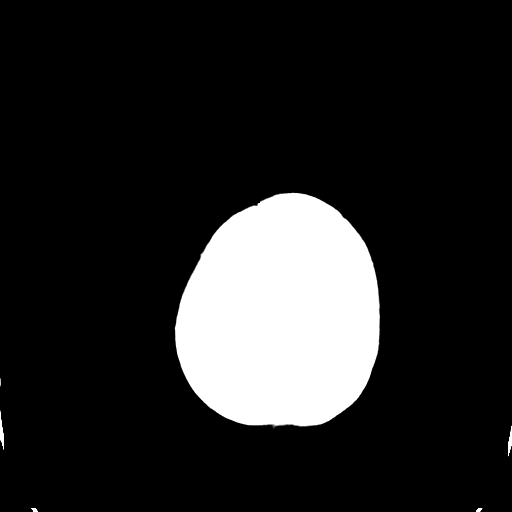
[im 34/36  brain]
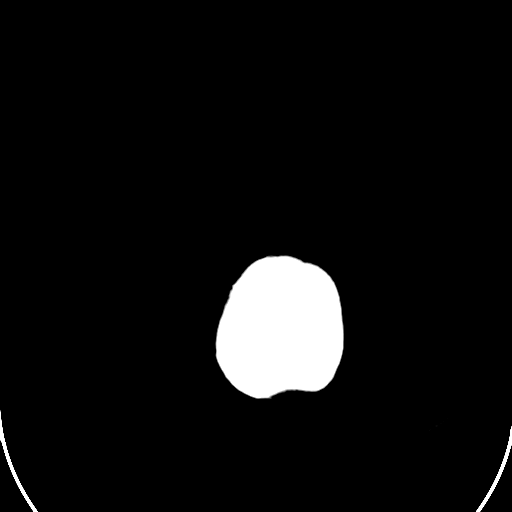

[16 of 30 positions shown; findings below may reference images not displayed]

FINDINGS: No acute intracranial abnormality is present.  Specifically, there is no evidence for acute infarct, hemorrhage, mass, hydrocephalus, or extra-axial fluid collection.  There has been no significant interval change from the prior study.
 The paranasal sinuses and mastoid air cells are clear.  The osseous skull is intact.
IMPRESSION: No acute intracranial abnormality.

## 2007-06-20 IMAGING — CT CT HEAD W/O CM
1 series · 16 of 30 positions shown, 20 images · non-contrast
Comparison: none

CLINICAL DATA: Overdose, seizure

[Series 2: headseq 4.8 h45s · axial · 0.46mm/px · z∈[+1329,+1486]mm · 16 of 36 slices shown, 20 images]
[im 2/36  brain]
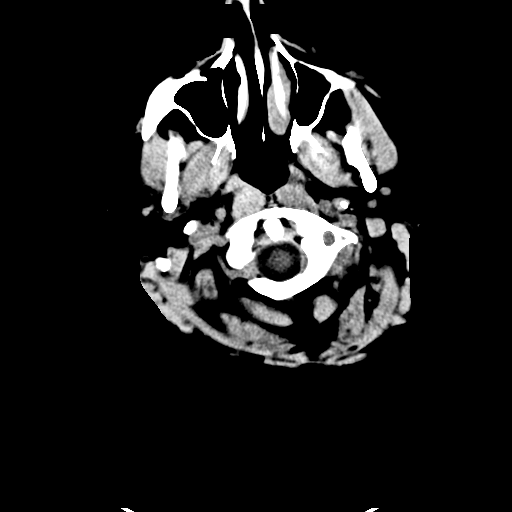
[im 2/36  bone]
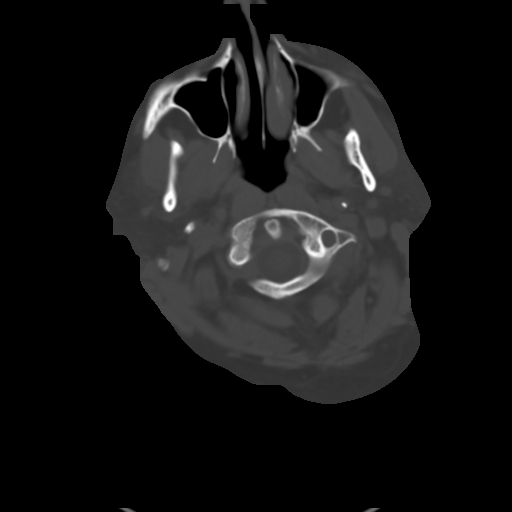
[im 4/36  brain]
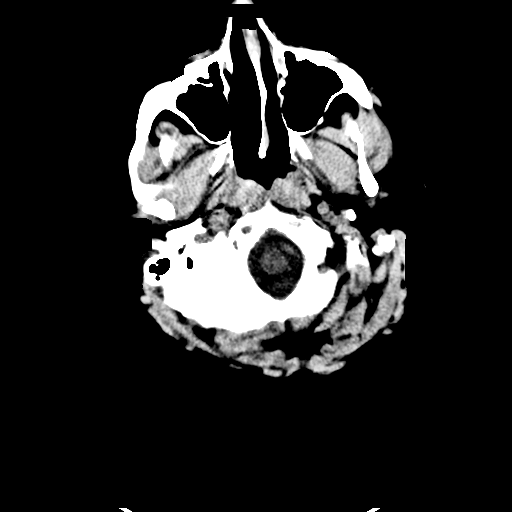
[im 7/36  brain]
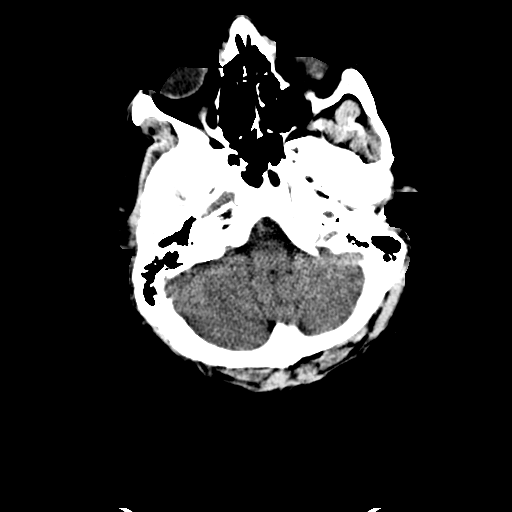
[im 9/36  brain]
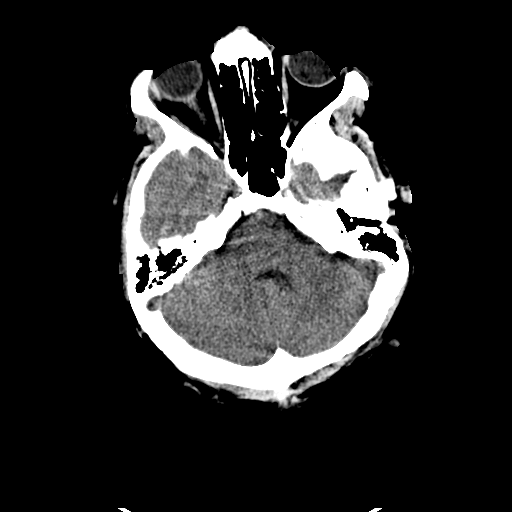
[im 10/36  brain]
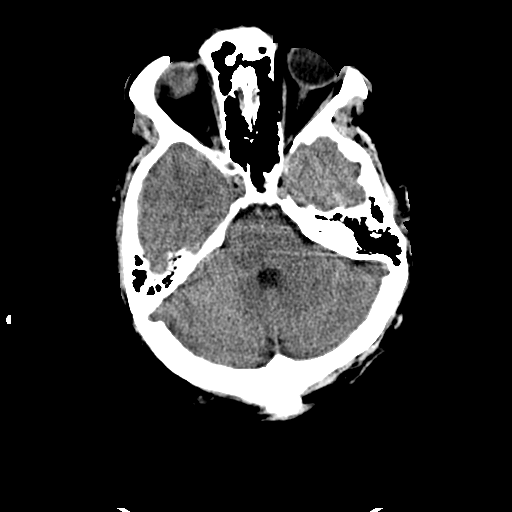
[im 10/36  bone]
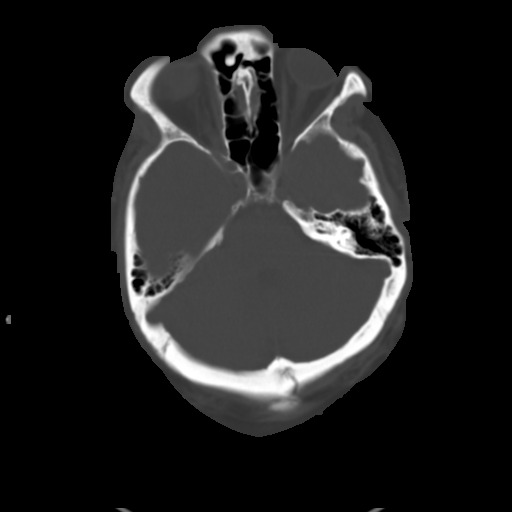
[im 13/36  brain]
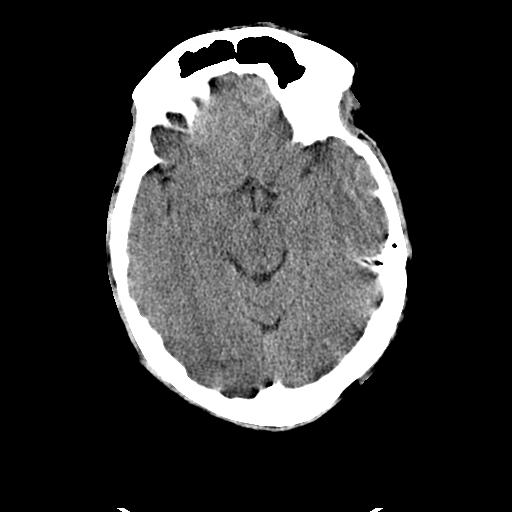
[im 15/36  brain]
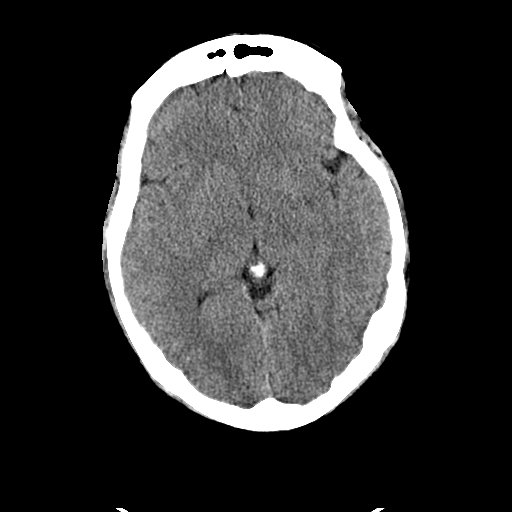
[im 17/36  brain]
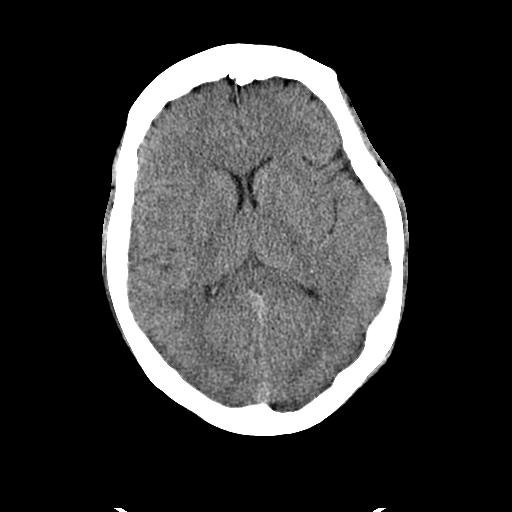
[im 19/36  brain]
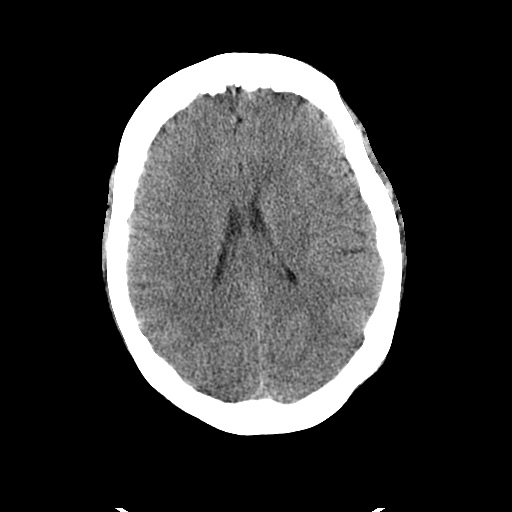
[im 19/36  bone]
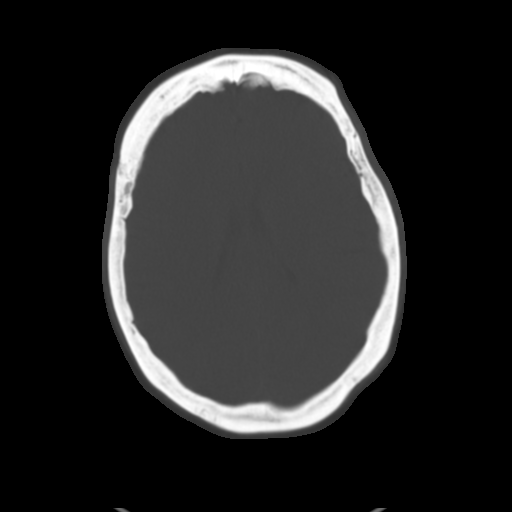
[im 21/36  brain]
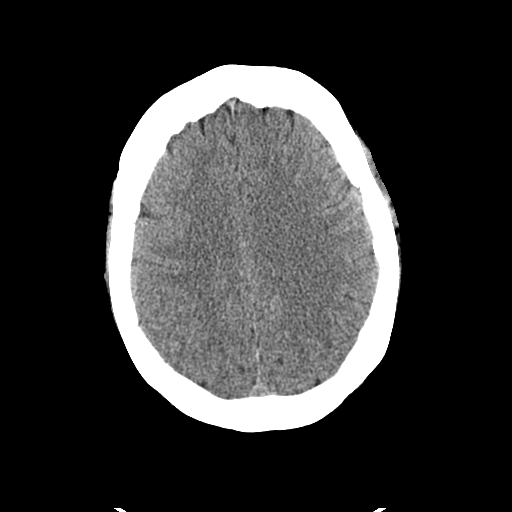
[im 23/36  brain]
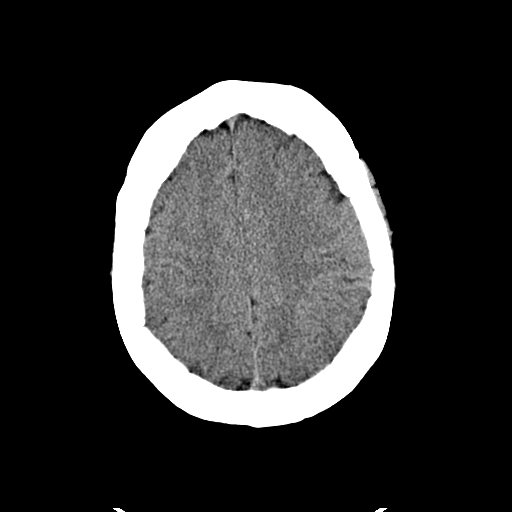
[im 26/36  brain]
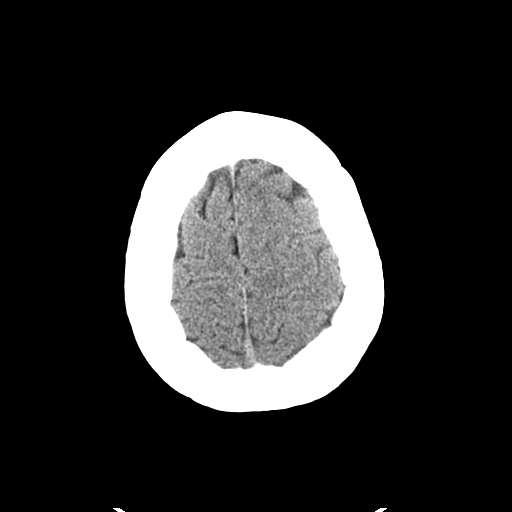
[im 27/36  brain]
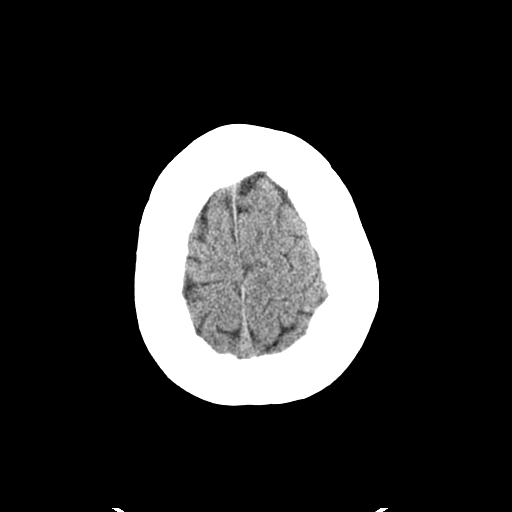
[im 27/36  bone]
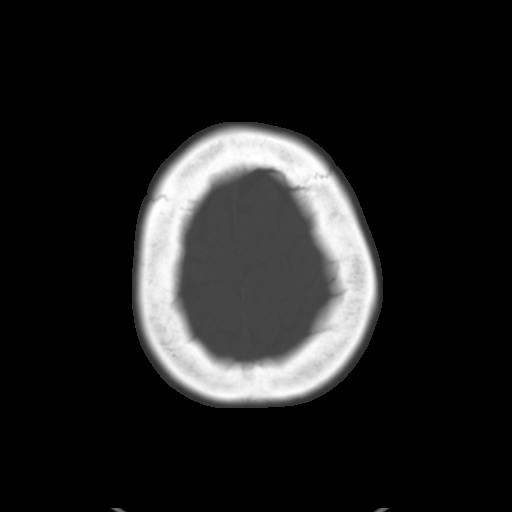
[im 29/36  brain]
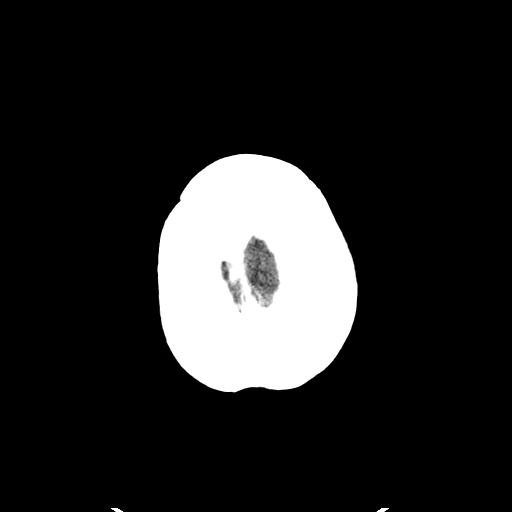
[im 32/36  brain]
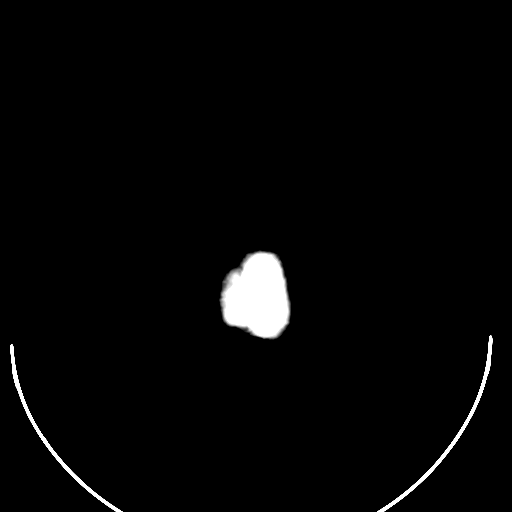
[im 34/36  brain]
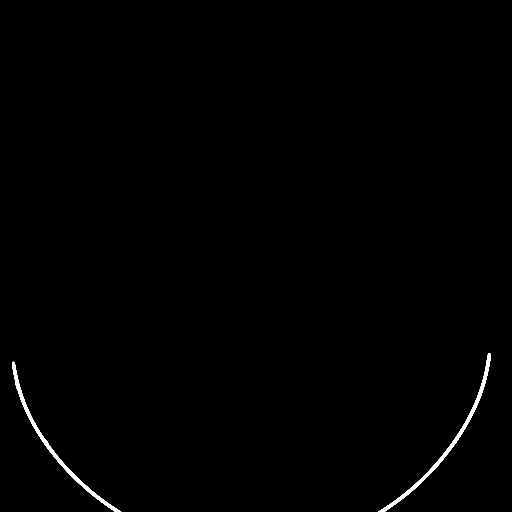

[16 of 30 positions shown; findings below may reference images not displayed]

CT head without contrast:

No previous for comparison. There is no evidence of acute intracranial
hemorrhage, brain edema,  mass effect, or midline shift.   No other intra-axial
abnormalities are seen, and the ventricles and sulci are within normal limits in
size and symmetry.   No abnormal extra-axial fluid collections or masses are
identified.  No skull abnormalities are noted.
IMPRESSION: 1. Negative non-contrast head CT.

## 2007-06-28 ENCOUNTER — Inpatient Hospital Stay (HOSPITAL_COMMUNITY): Admission: EM | Admit: 2007-06-28 | Discharge: 2007-06-30 | Payer: Self-pay | Admitting: Emergency Medicine

## 2007-06-30 ENCOUNTER — Inpatient Hospital Stay (HOSPITAL_COMMUNITY): Admission: RE | Admit: 2007-06-30 | Discharge: 2007-07-12 | Payer: Self-pay | Admitting: *Deleted

## 2007-06-30 ENCOUNTER — Ambulatory Visit: Payer: Self-pay | Admitting: *Deleted

## 2007-09-10 ENCOUNTER — Emergency Department (HOSPITAL_COMMUNITY): Admission: EM | Admit: 2007-09-10 | Discharge: 2007-09-10 | Payer: Self-pay | Admitting: Emergency Medicine

## 2007-09-21 ENCOUNTER — Emergency Department (HOSPITAL_COMMUNITY): Admission: EM | Admit: 2007-09-21 | Discharge: 2007-09-21 | Payer: Self-pay | Admitting: Emergency Medicine

## 2007-10-07 ENCOUNTER — Emergency Department (HOSPITAL_COMMUNITY): Admission: EM | Admit: 2007-10-07 | Discharge: 2007-10-08 | Payer: Self-pay | Admitting: Emergency Medicine

## 2008-01-07 ENCOUNTER — Emergency Department (HOSPITAL_COMMUNITY): Admission: EM | Admit: 2008-01-07 | Discharge: 2008-01-07 | Payer: Self-pay

## 2008-01-09 IMAGING — CR DG TOE GREAT 2+V*R*
2 series · 2 of 2 positions shown · non-contrast
Comparison: none

HISTORY: Pain and swelling right great toe, infection, question osteomyelitis

RIGHT GREAT TOE 3 VIEWS:
Joint spaces preserved.
Bone mineralization normal.
Soft tissue swelling diffusely right great toe.
No fracture, dislocation, or bone destruction.
Cortex of distal phalanx appears intact.

[view not recorded (1 of 2)]
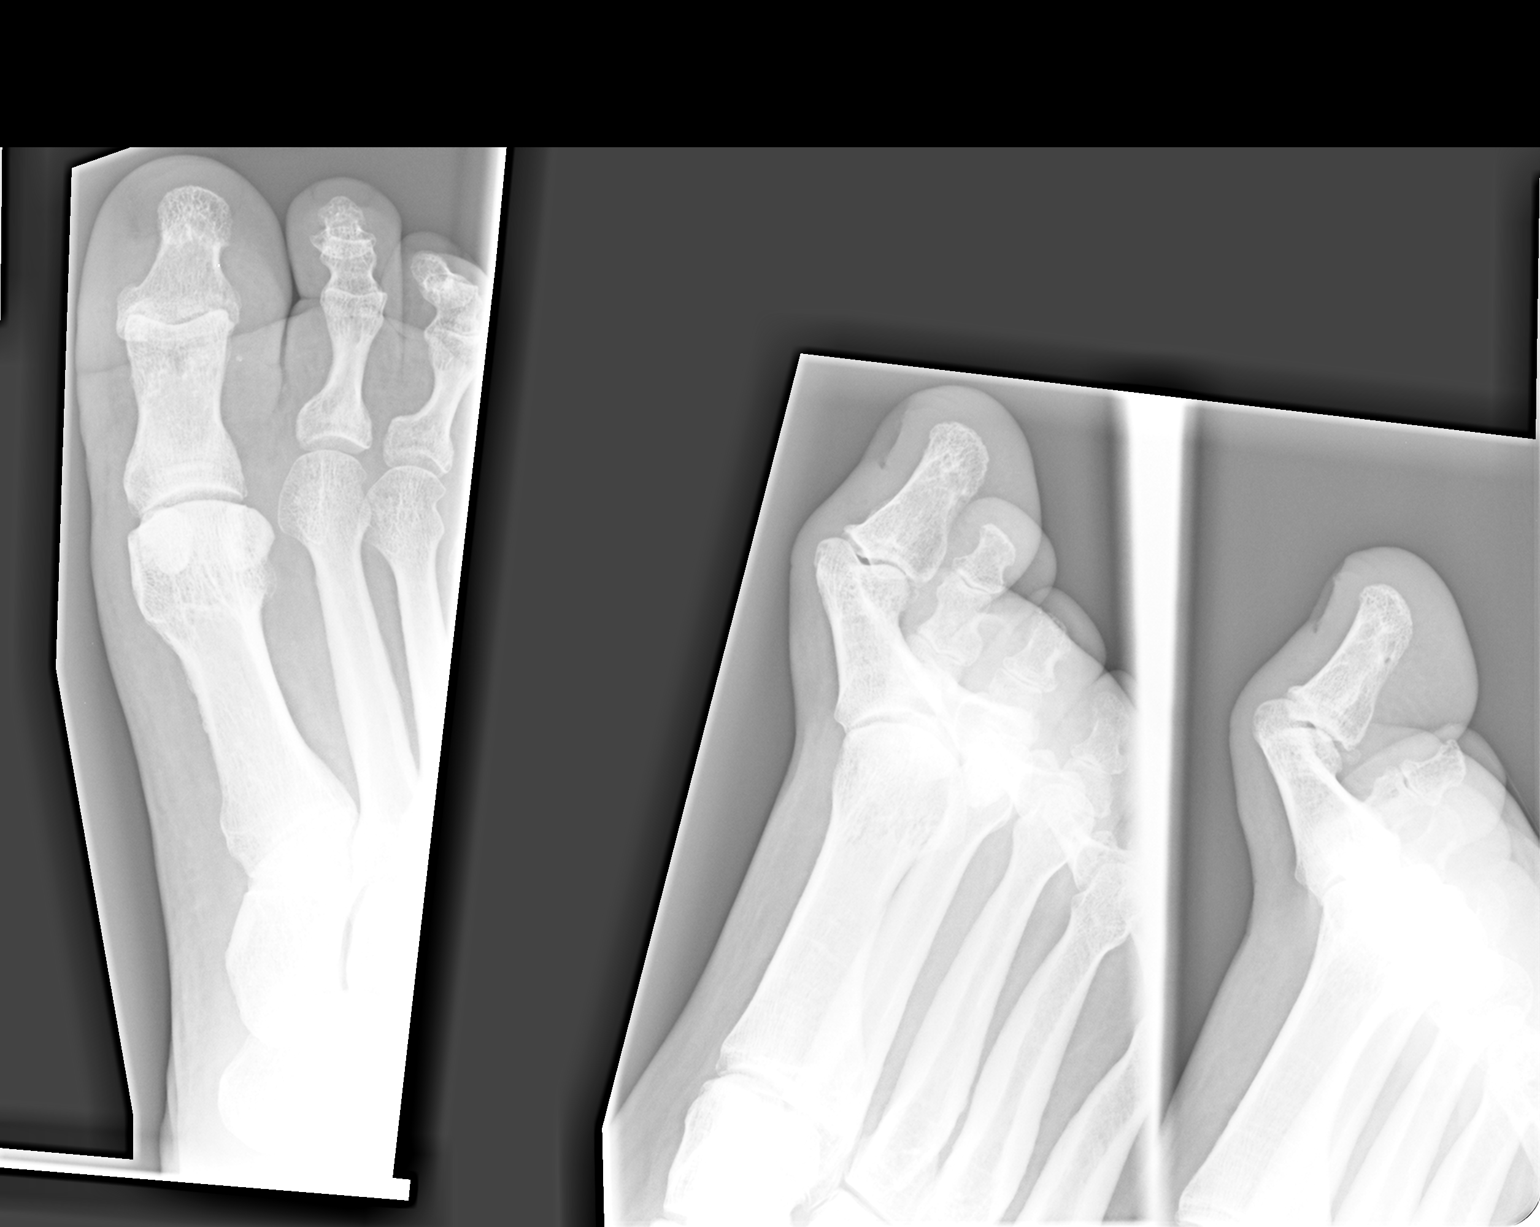

[view not recorded (2 of 2)]
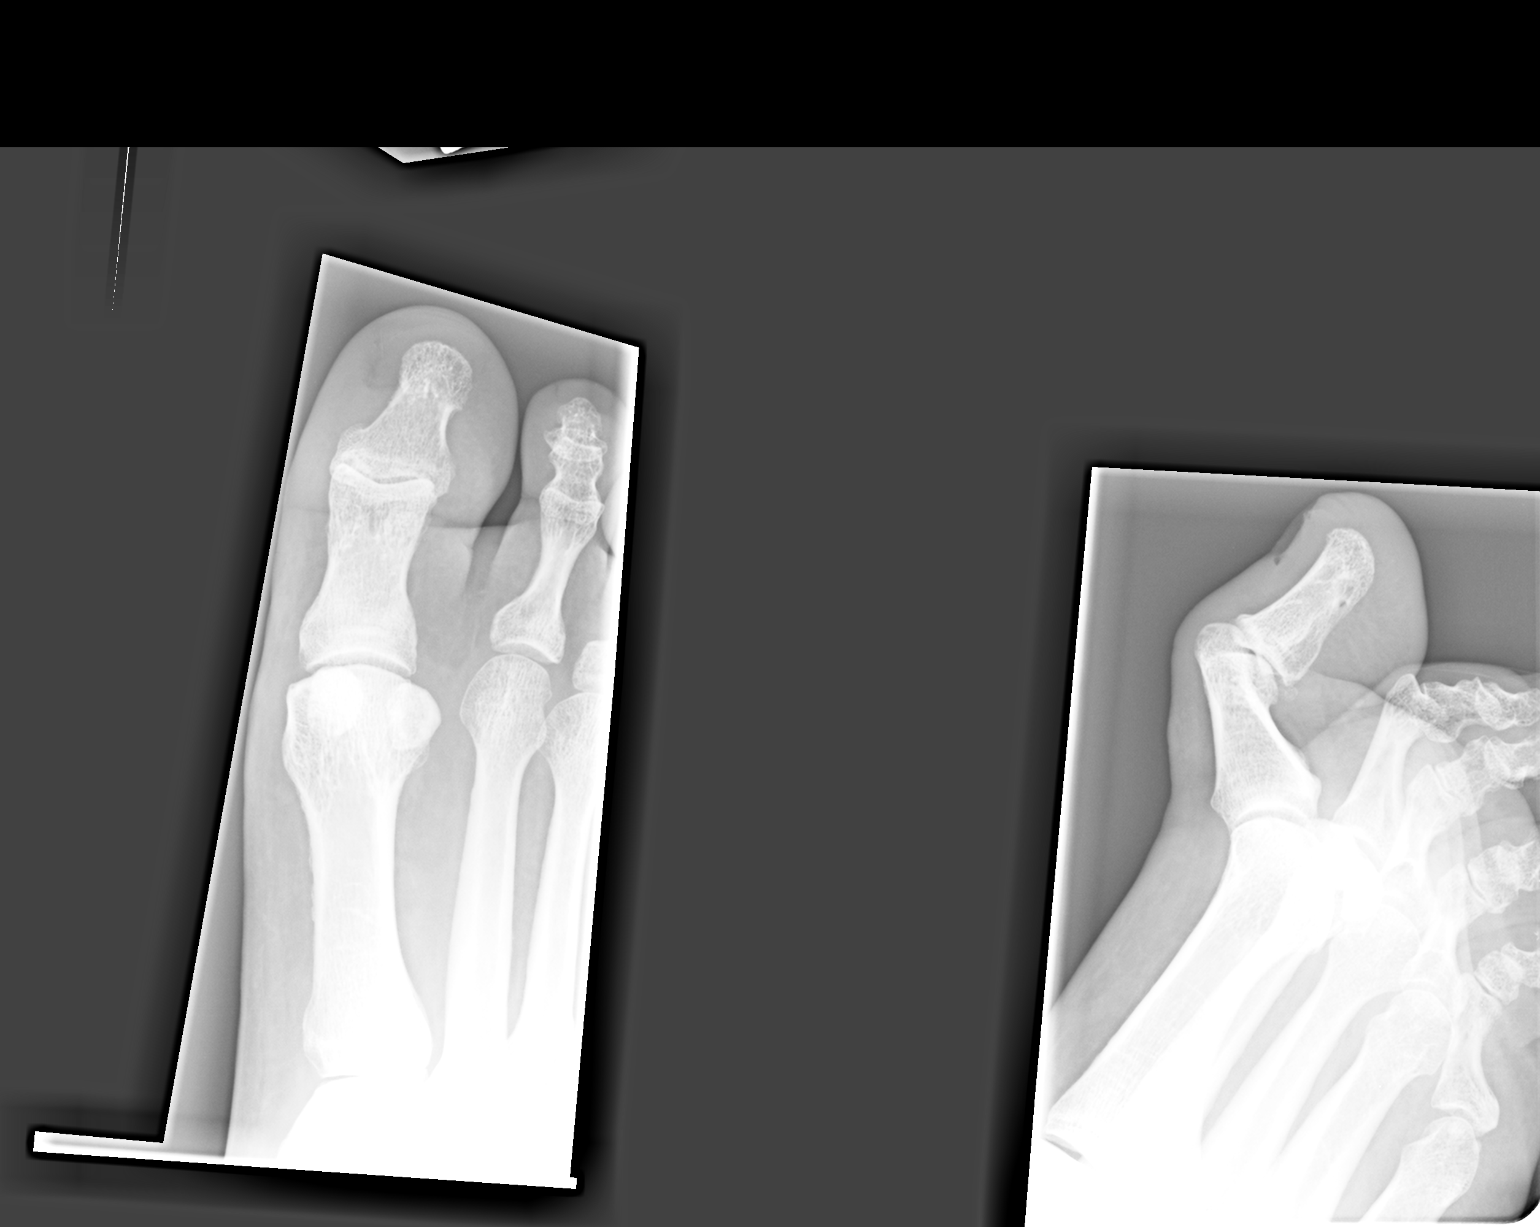

[2 of 2 positions shown; findings below may reference images not displayed]

IMPRESSION: No acute bony abnormalities.

## 2008-02-02 IMAGING — CR DG LUMBAR SPINE 2-3V
3 series · 3 of 3 positions shown · non-contrast
Comparison: Limited lumbar series 12/09/05, upper GI series performed earlier this same day.

CLINICAL DATA: 41-year-old female who had syncopal episode and fell with low back pain.  
 LUMBAR SPINE ? 3 VIEW:

[t l-spine a.p.]
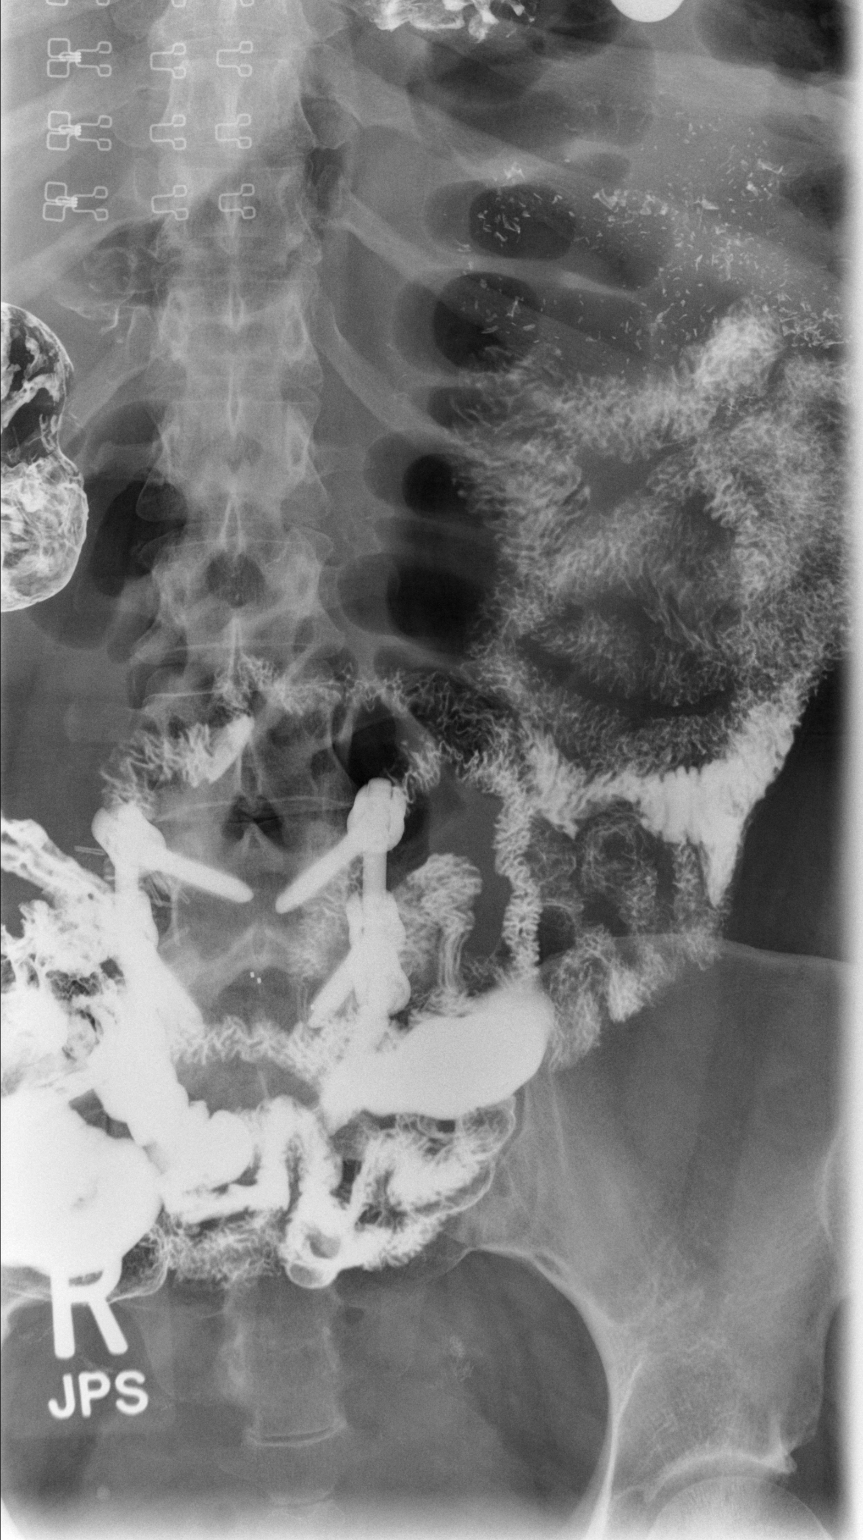

[t l-spine lat]
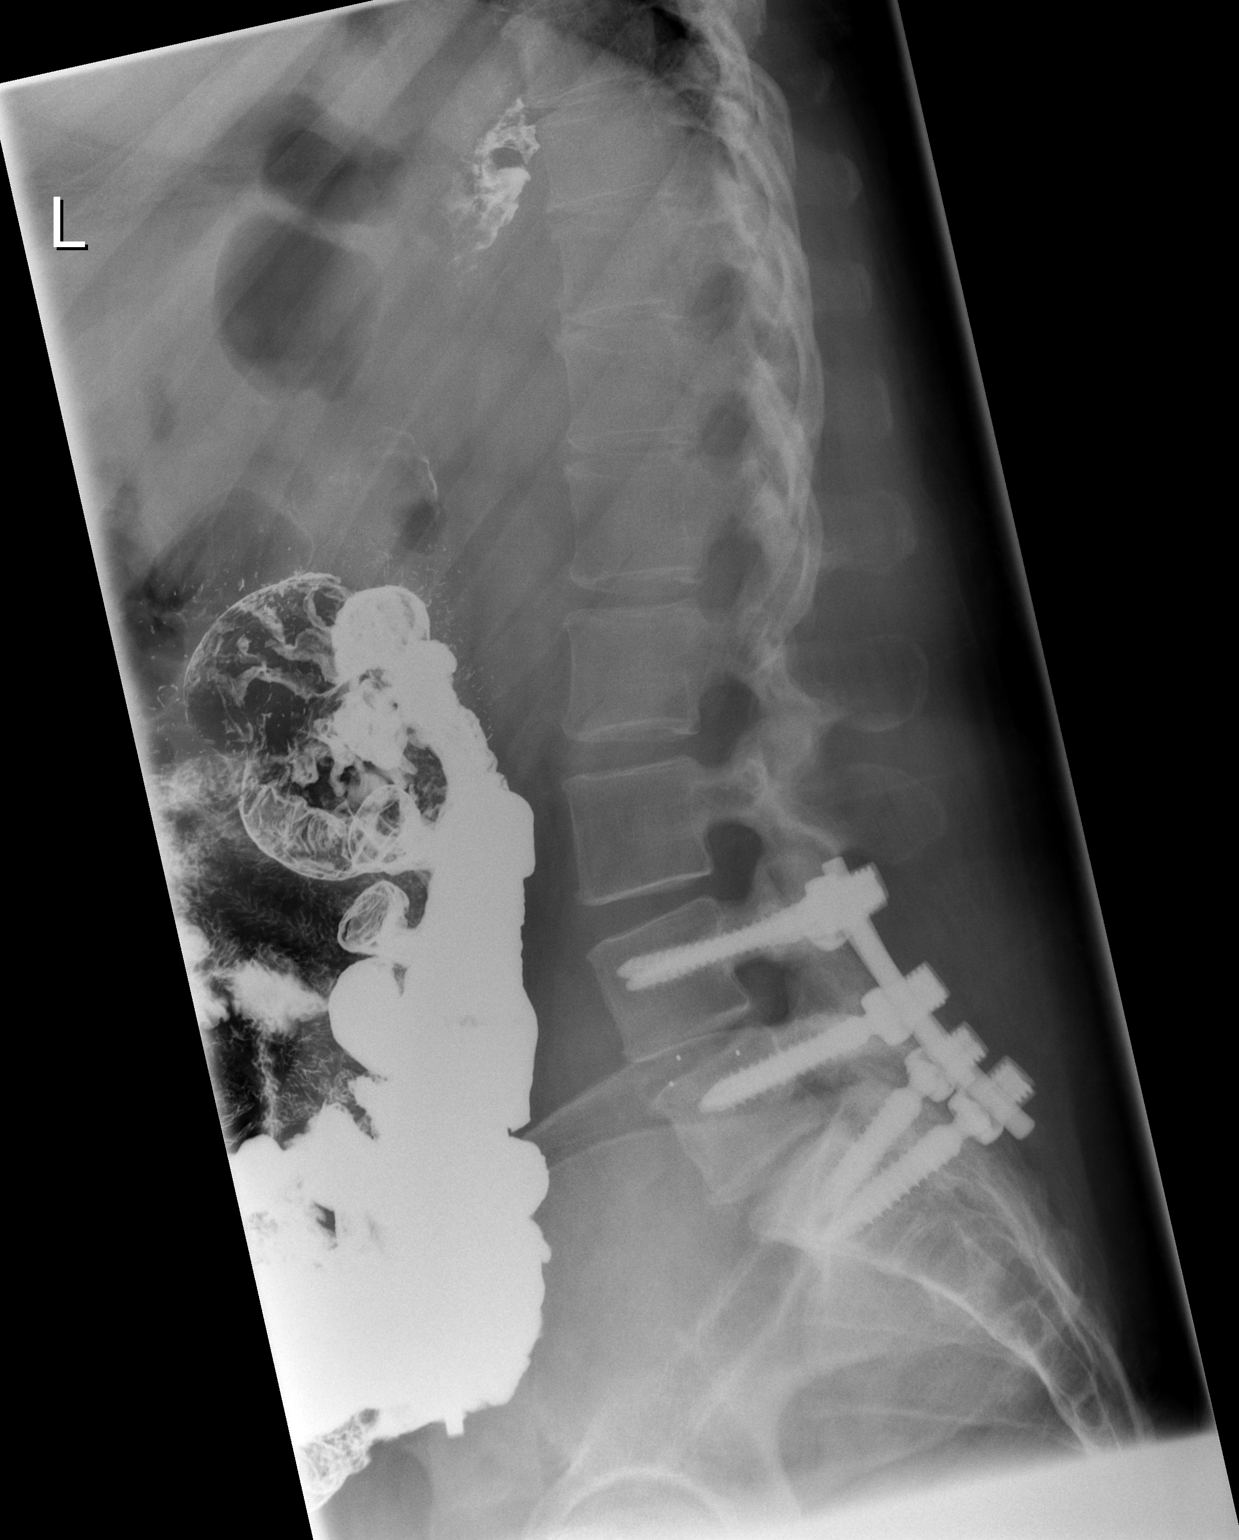

[t l-spine l5-s1 spot]
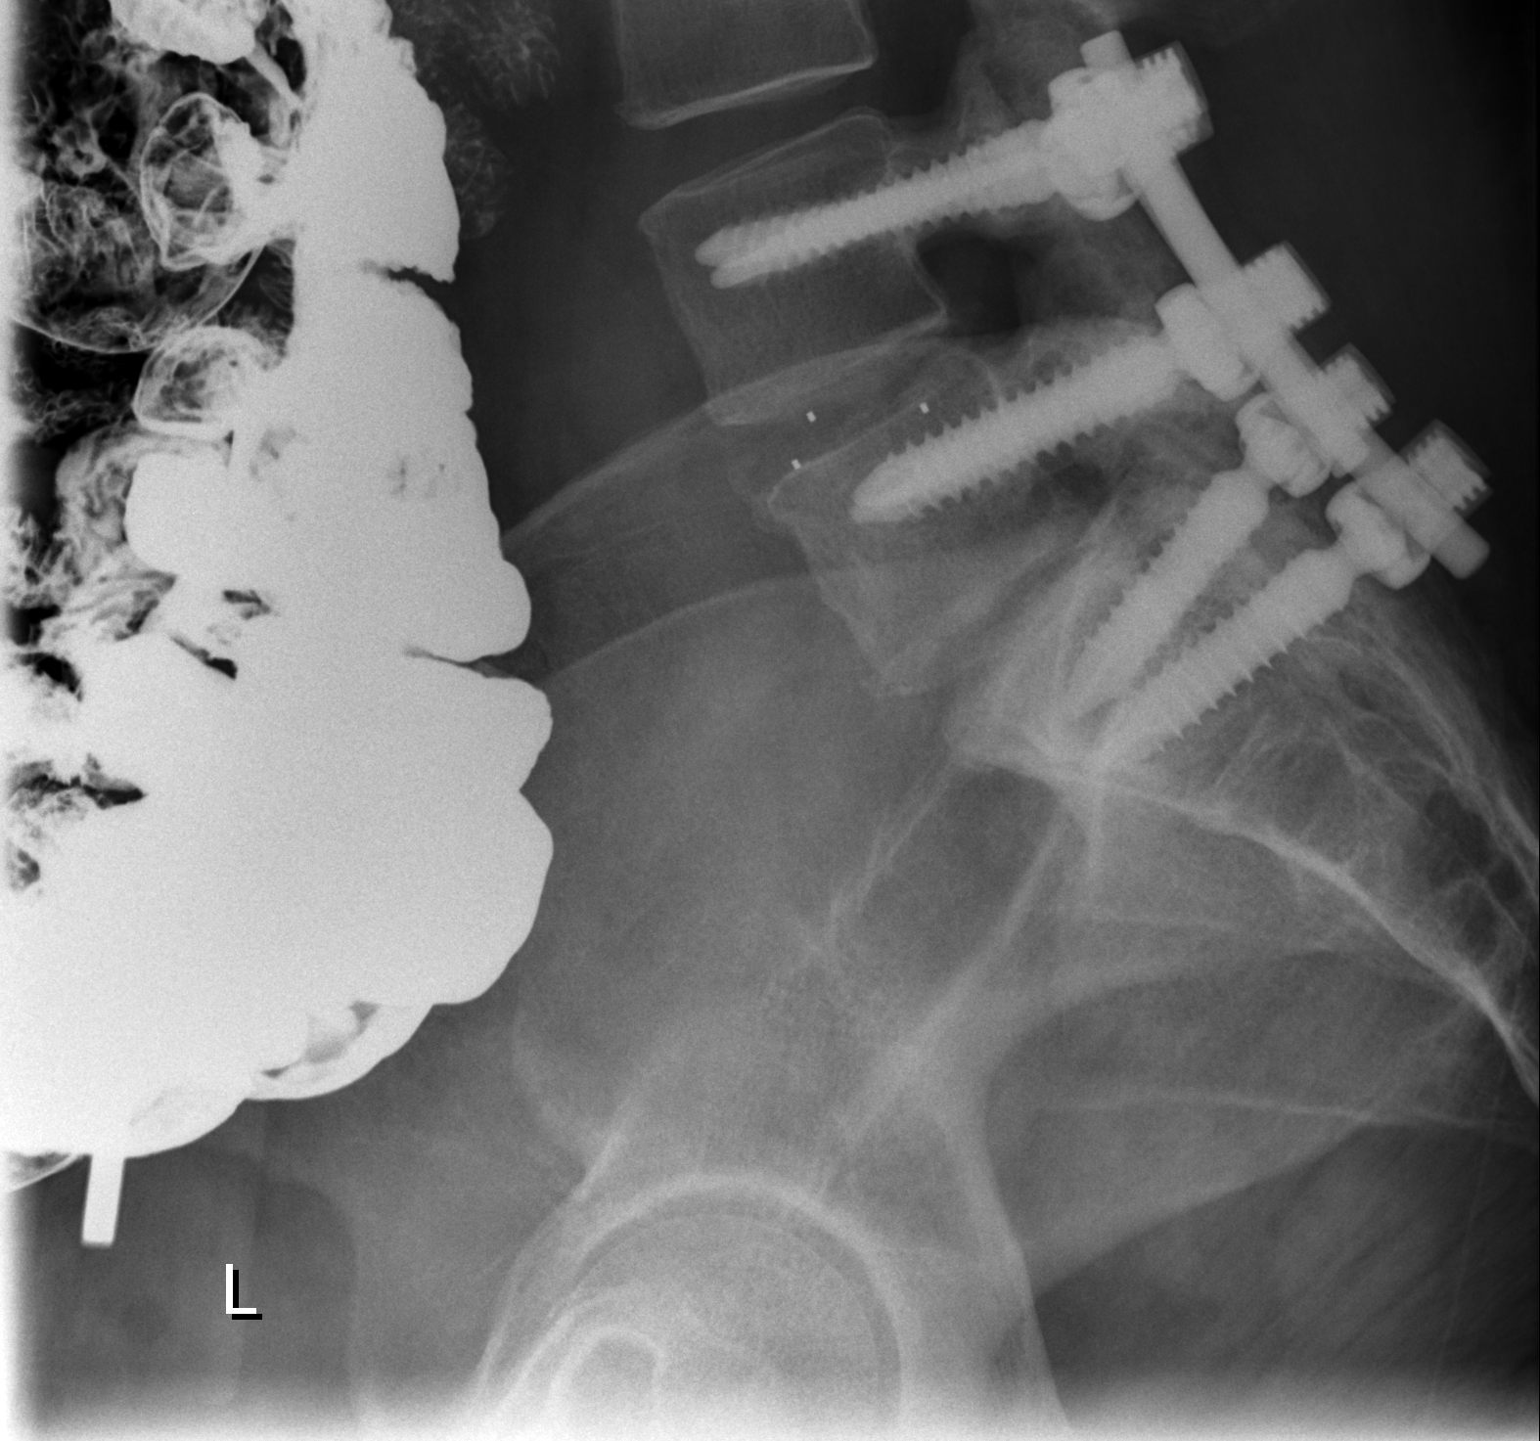

[3 of 3 positions shown; findings below may reference images not displayed]

FINDINGS: There is retained barium contrast throughout small bowel and the proximal colon from the upper GI study the patient had done at [DATE] a.m. this morning.  Sequela of L4 through S1 posterior fusion are noted, hardware appears stable from the 2338 exam.  Stable height and alignment of lumbar vertebra with intervertebral disk space narrowing most pronounced at L5-S1.  Interbody fusion spacer is noted at the L4-L5 level.
IMPRESSION: Study limited by retained barium contrast.   Stable postoperative changes to the lower lumbar spine, no acute fracture or listhesis identified.

## 2008-02-02 IMAGING — RF DG UGI W/ KUB
19 of 24 series · 19 of 24 positions shown · non-contrast
Comparison: Ultrasound 11/02/06, plain film of the abdomen, lateral view lumbar spine 12/09/05.

CLINICAL DATA: Gastroparesis, not diabetic.
UPPER GI WITH KUB:
TECHNIQUE: Double contrast upper GI with full column esophagram was performed using barium contrast agent.

[Series 1: run · 1 of 1 slices shown (1 of 19)]
[im 1/1]
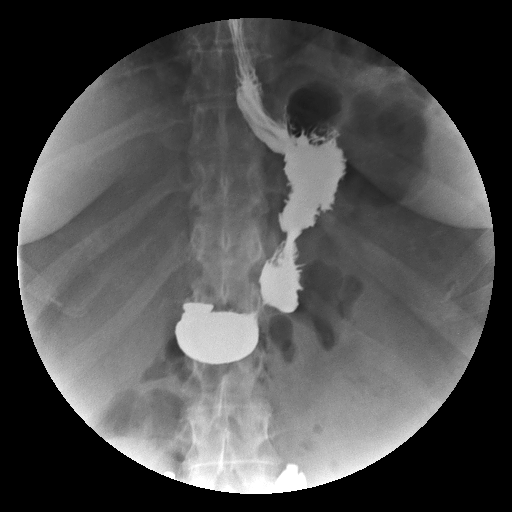

[Series 2: run · 1 of 1 slices shown (2 of 19)]
[im 1/1]
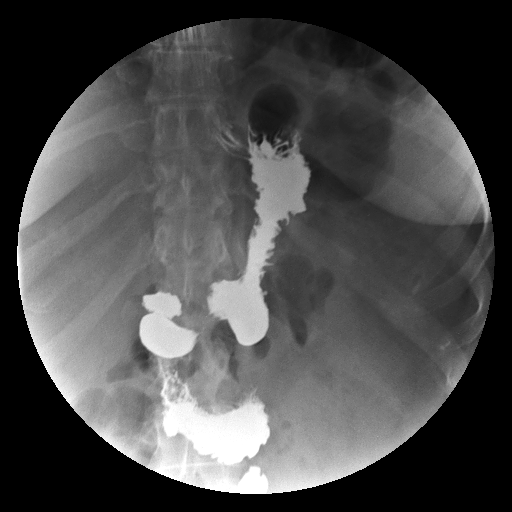

[Series 4: run · 1 of 1 slices shown (3 of 19)]
[im 1/1]
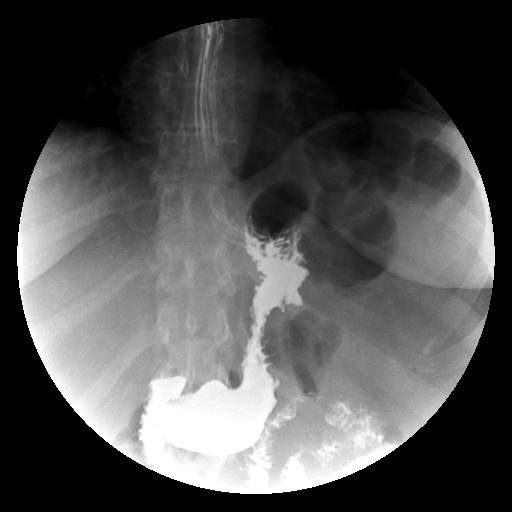

[Series 5: run · 1 of 1 slices shown (4 of 19)]
[im 1/1]
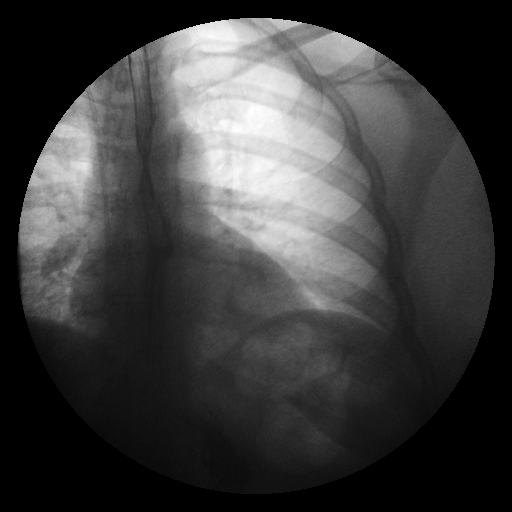

[Series 6: run · 1 of 1 slices shown (5 of 19)]
[im 1/1]
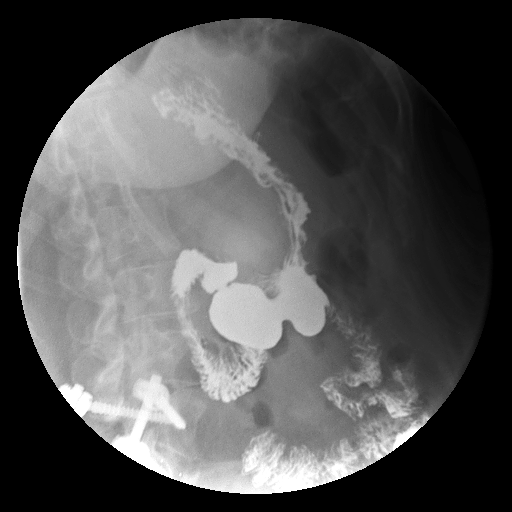

[Series 7: run · 1 of 1 slices shown (6 of 19)]
[im 1/1]
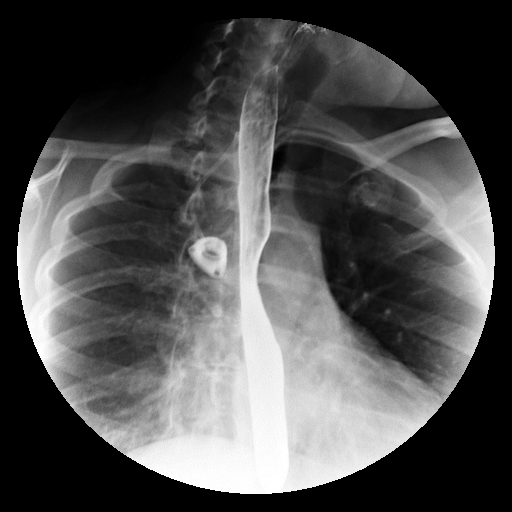

[Series 9: run · 1 of 1 slices shown (7 of 19)]
[im 1/1]
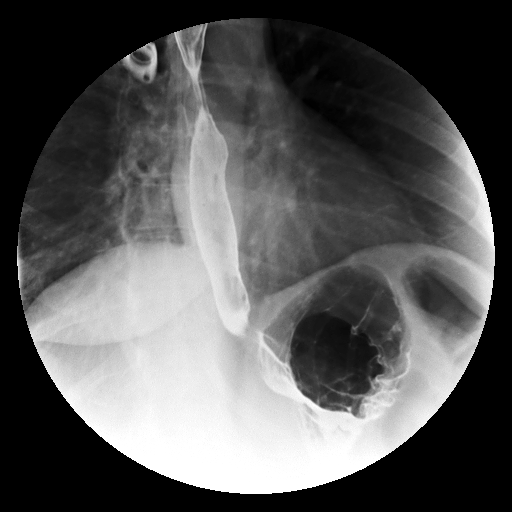

[Series 10: run · 1 of 1 slices shown (8 of 19)]
[im 1/1]
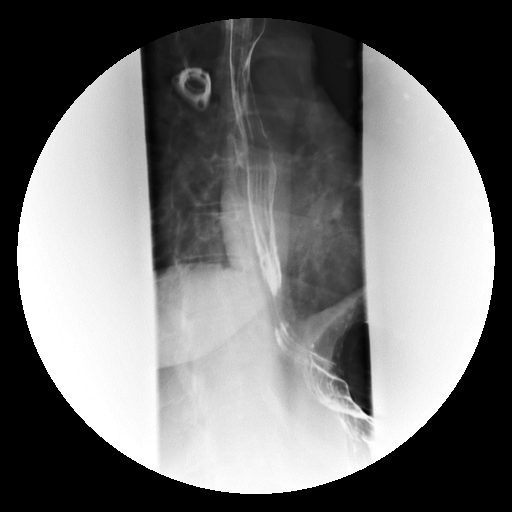

[Series 11: run · 1 of 1 slices shown (9 of 19)]
[im 1/1]
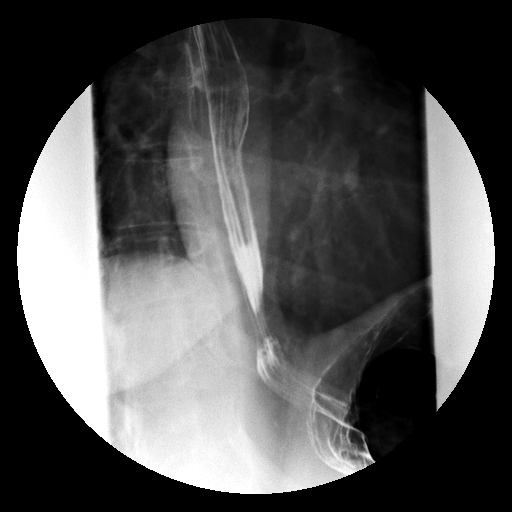

[Series 13: run · 1 of 1 slices shown (10 of 19)]
[im 1/1]
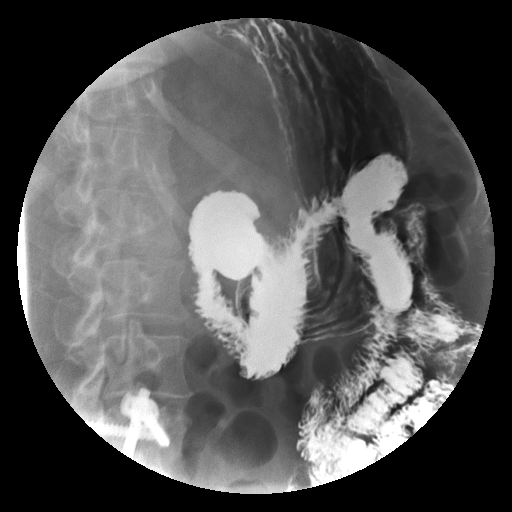

[Series 14: run · 1 of 1 slices shown (11 of 19)]
[im 1/1]
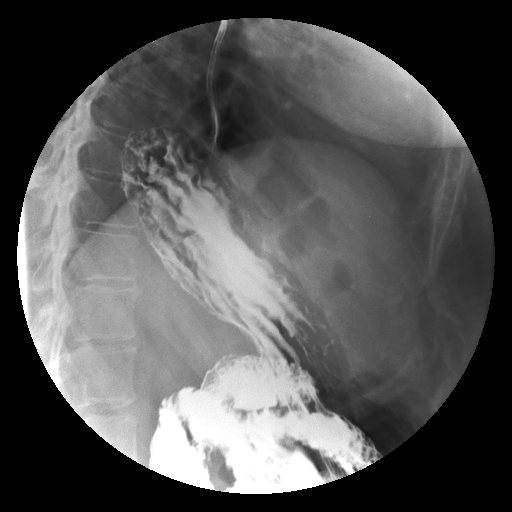

[Series 15: run · 1 of 1 slices shown (12 of 19)]
[im 1/1]
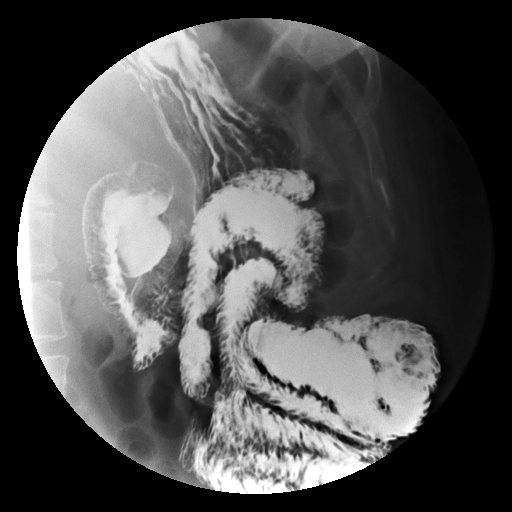

[Series 16: run · 1 of 1 slices shown (13 of 19)]
[im 1/1]
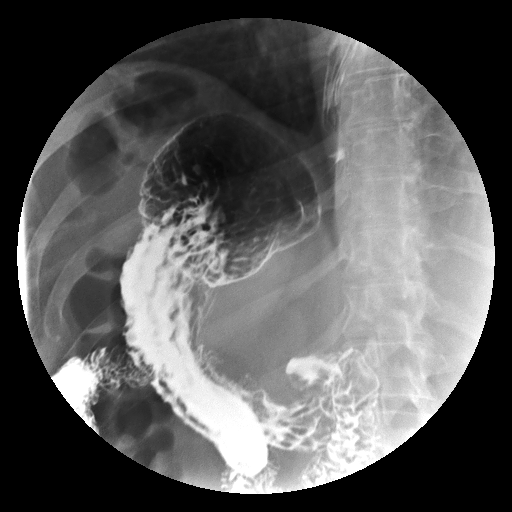

[Series 18: run · 1 of 1 slices shown (14 of 19)]
[im 1/1]
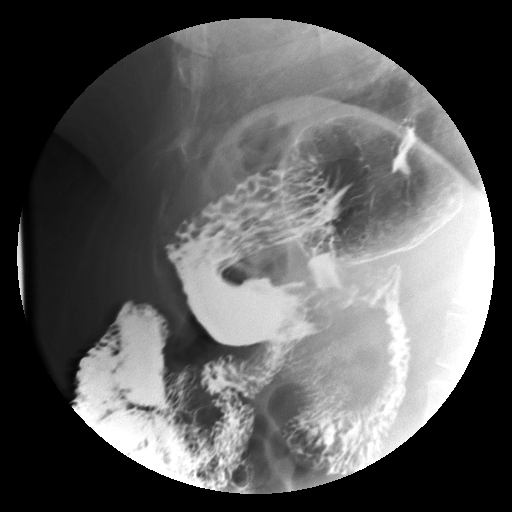

[Series 19: run · 1 of 1 slices shown (15 of 19)]
[im 1/1]
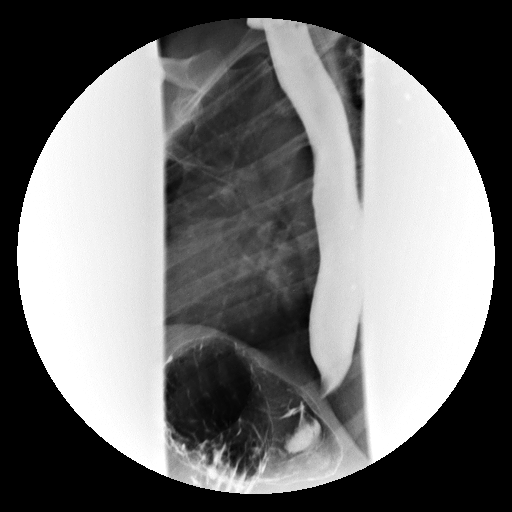

[Series 20: run · 1 of 1 slices shown (16 of 19)]
[im 1/1]
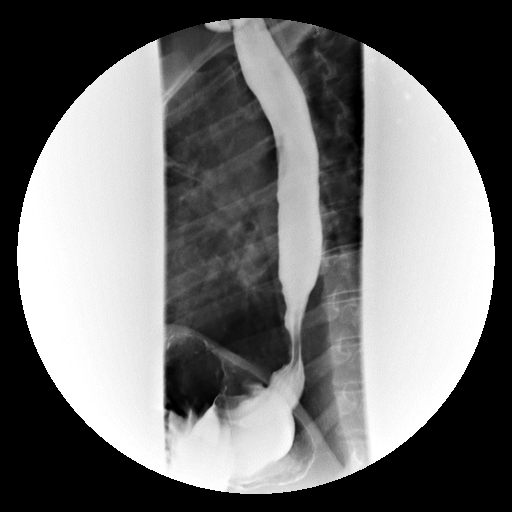

[Series 21: run · 1 of 1 slices shown (17 of 19)]
[im 1/1]
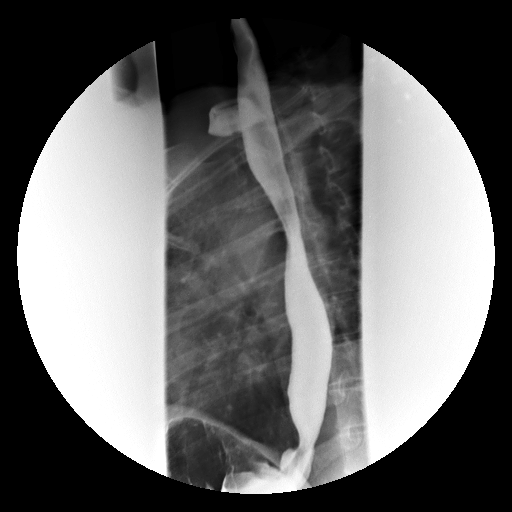

[Series 23: run · 1 of 1 slices shown (18 of 19)]
[im 1/1]
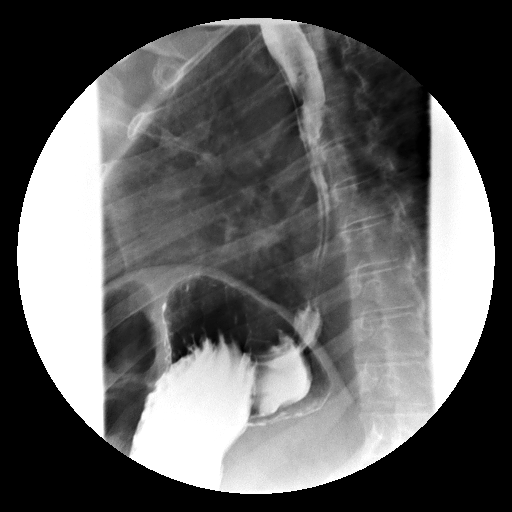

[Series 24: run · 1 of 1 slices shown (19 of 19)]
[im 1/1]
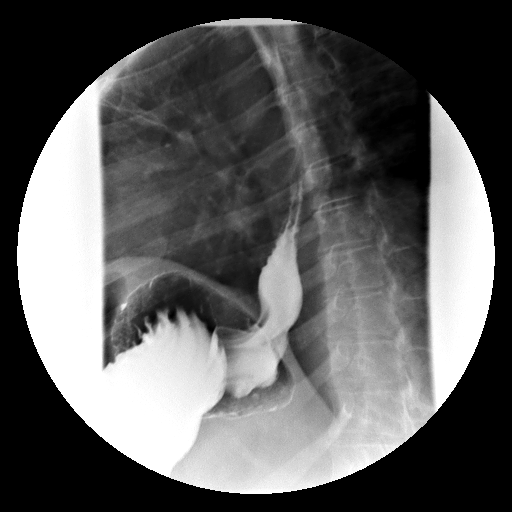

[19 of 24 positions shown; findings below may reference images not displayed]

FINDINGS: The stomach appeared normal. There was normal brisk emptying of the stomach, with the first passage of barium.  No esophageal lesions were identified. Distal esophageal folds were thin, without evidence of reflux esophagitis. No mass lesions or ulcerations.  Duodenal bulb was normal. Full column esophagram was performed which showed no stricture or mass lesions. Normal appearance of mucosa. Four of 5 stripping waves reached the gastroesophageal junction uninterrupted. On one instance, tertiary contractions were visualized in the distal esophagus, in the prone RAO position. Duodenal bulb was normal as was proximal small bowel. Rotation is normal.  Lumbosacral posterior lumbar interbody fusion noted.  Multiple surgical clips identified in the right mid abdomen and pelvis.
IMPRESSION: 1.  No structural lesions in the upper digestive tract.
2.  Very mild nonspecific esophageal motility disorder.
3.  No evidence of gastroparesis, with brisk emptying of barium contrast from the stomach. [employer] nuclear medicine gastric emptying study for evaluation of gastroparesis.

## 2008-02-29 ENCOUNTER — Inpatient Hospital Stay (HOSPITAL_COMMUNITY): Admission: EM | Admit: 2008-02-29 | Discharge: 2008-03-01 | Payer: Self-pay | Admitting: Emergency Medicine

## 2008-03-01 ENCOUNTER — Ambulatory Visit: Payer: Self-pay | Admitting: Psychiatry

## 2008-03-01 ENCOUNTER — Inpatient Hospital Stay (HOSPITAL_COMMUNITY): Admission: AD | Admit: 2008-03-01 | Discharge: 2008-03-07 | Payer: Self-pay | Admitting: Psychiatry

## 2008-03-02 IMAGING — CR DG CHEST 2V
2 series · 2 of 2 positions shown · non-contrast
Comparison: 12/07/05

CLINICAL DATA: Chest pain and weakness.
 CHEST ? 2 VIEW:

[w chest pa]
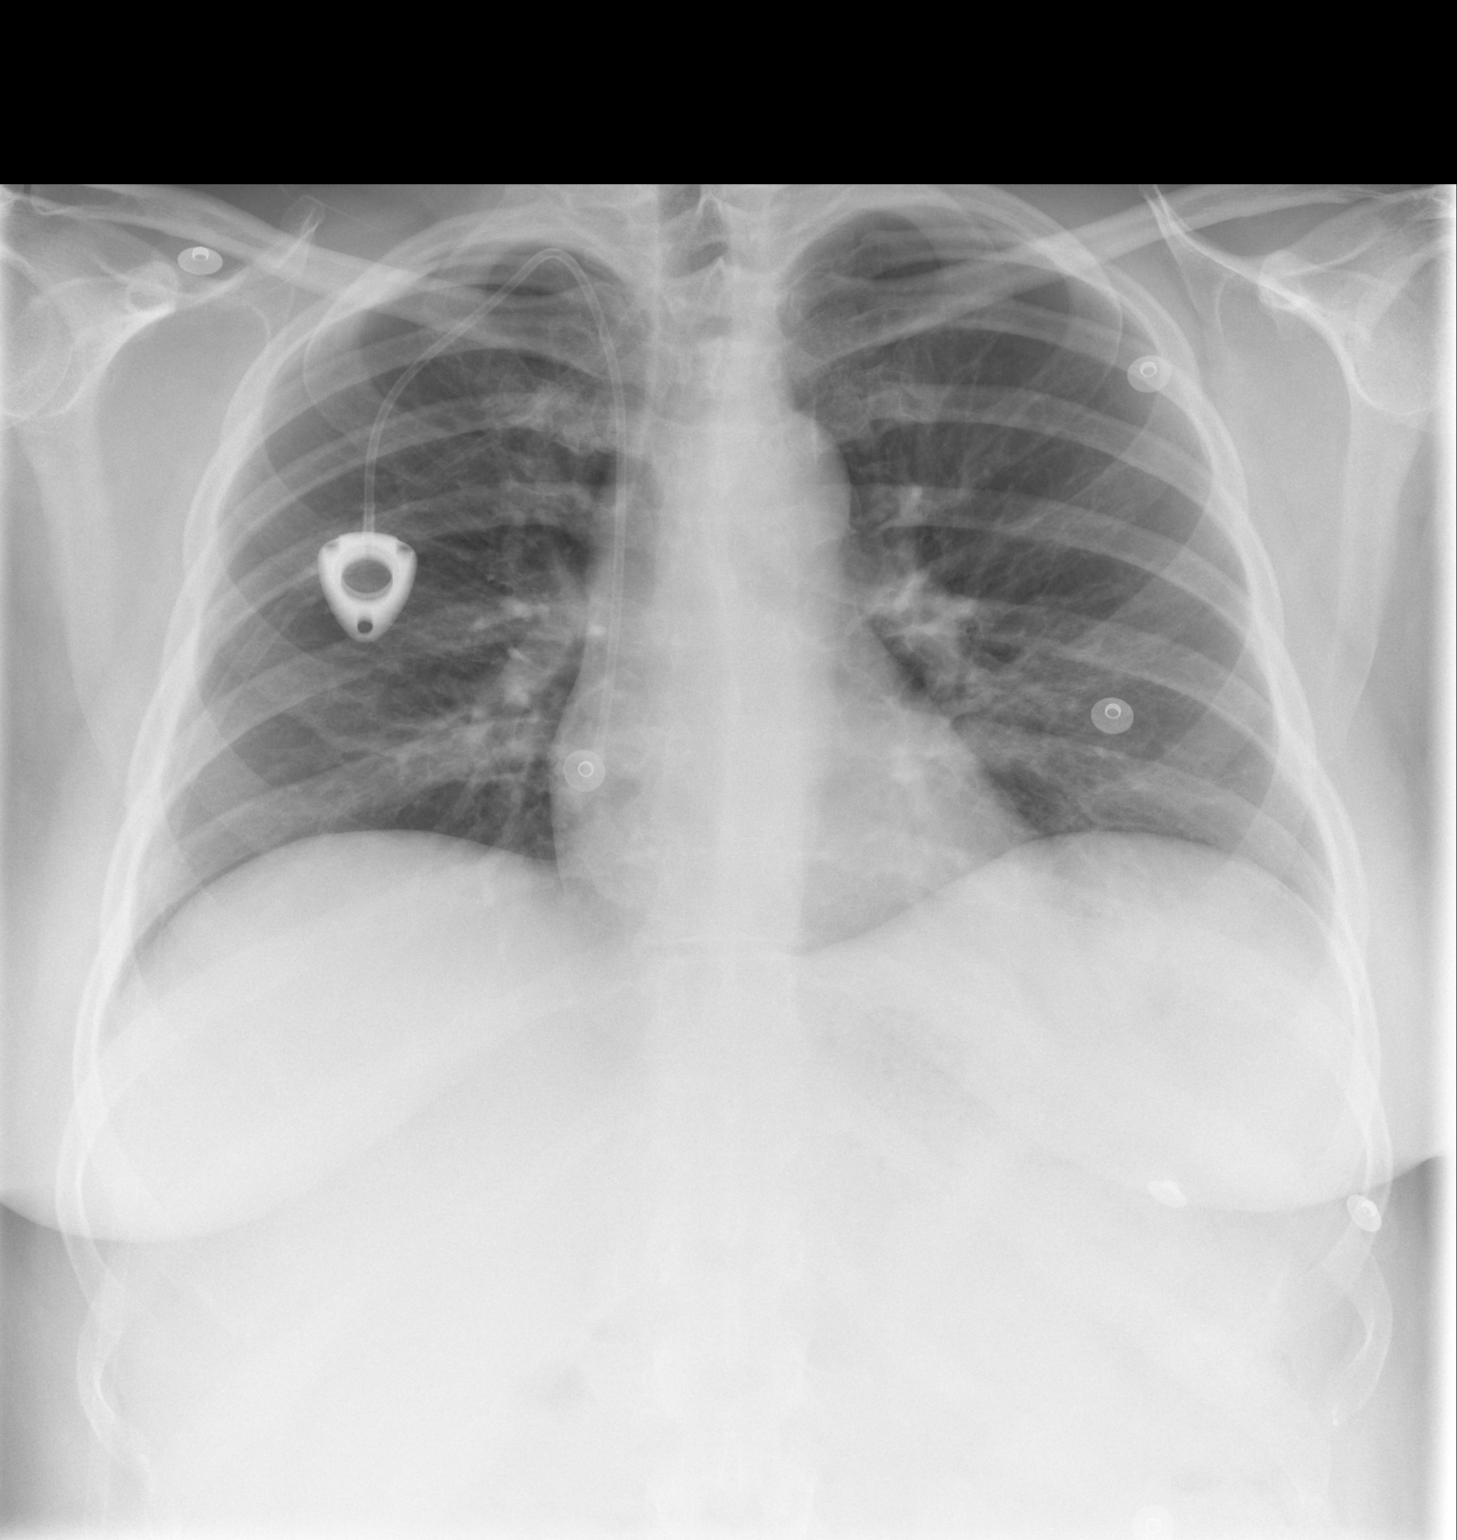

[w chest lat]
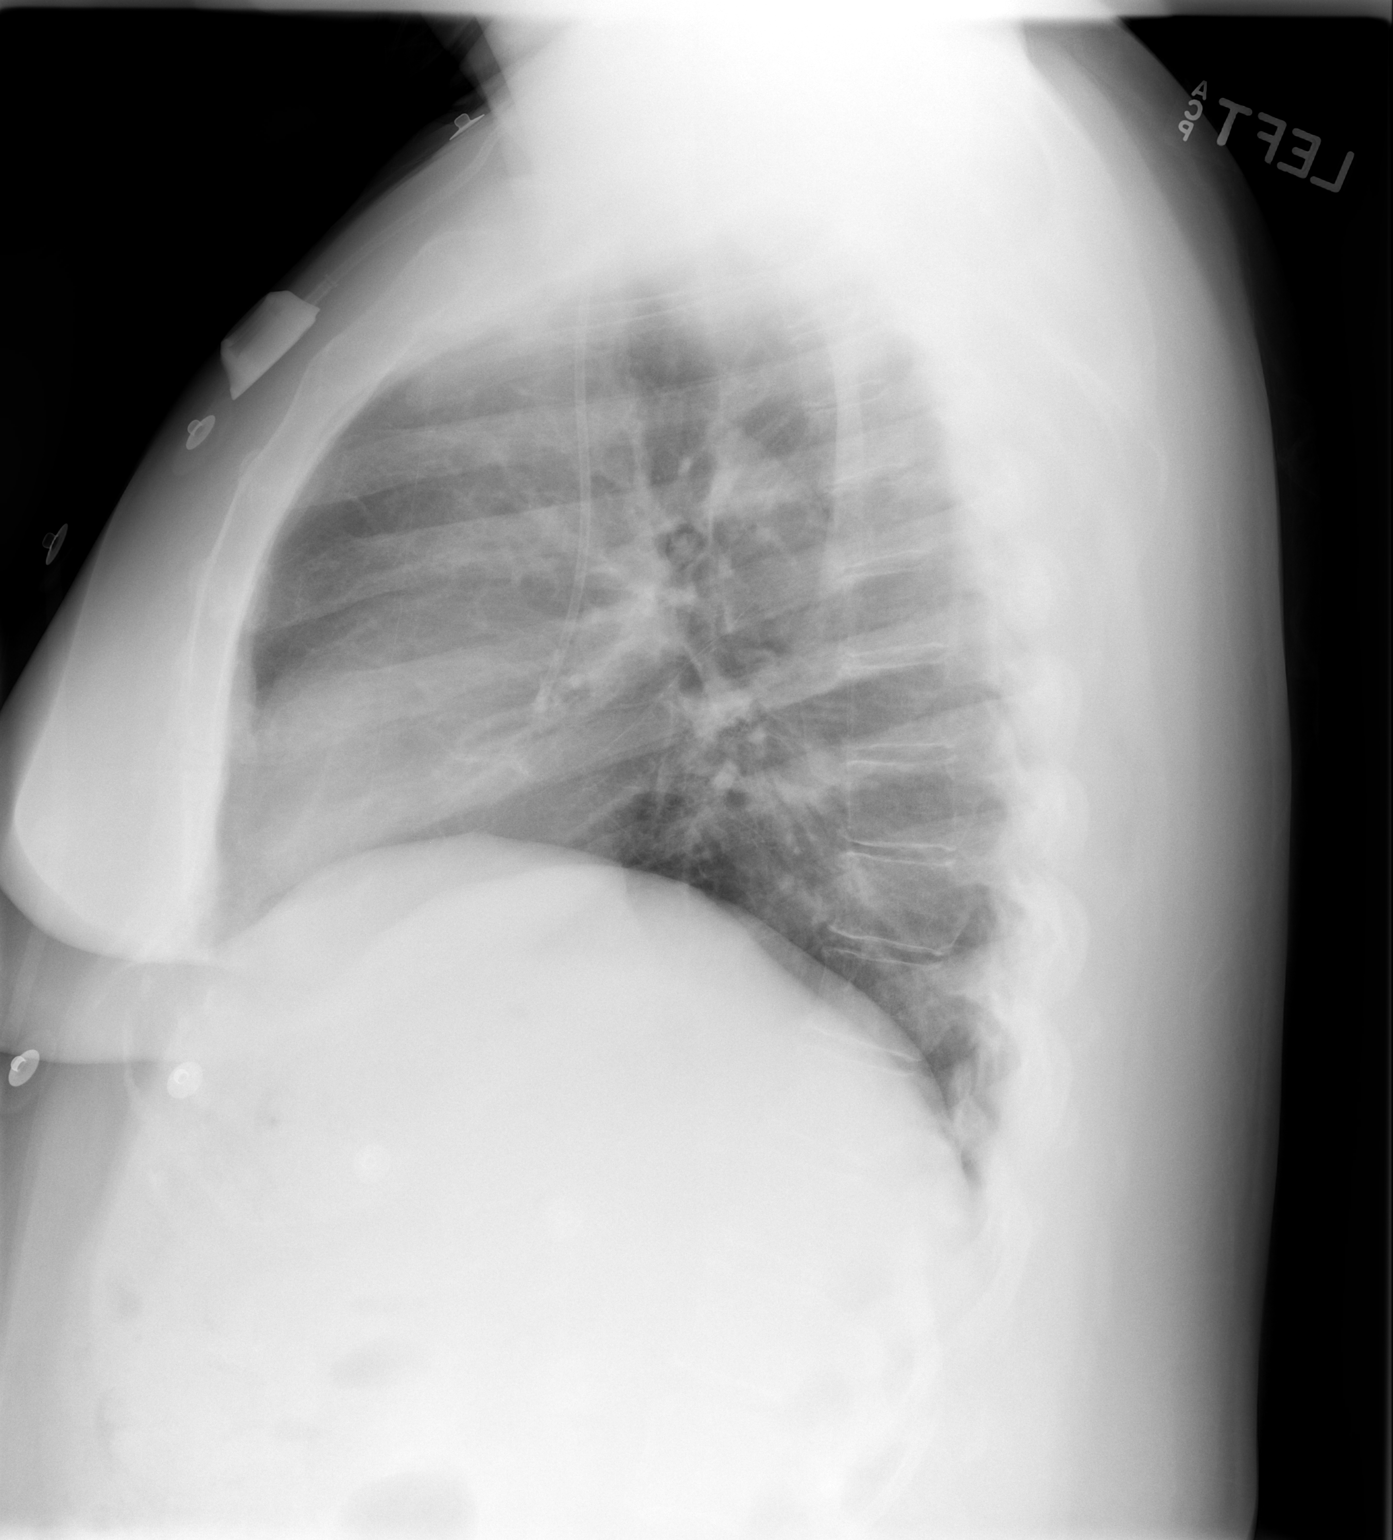

[2 of 2 positions shown; findings below may reference images not displayed]

FINDINGS: The patient has a new right-sided portacath in place.  The lungs are clear.  Heart size is normal.  No pleural effusion or focal bony abnormality.
IMPRESSION: No acute disease.  Stable exam.

## 2008-03-05 IMAGING — CR DG CHEST 2V
2 series · 2 of 2 positions shown · non-contrast
Comparison: 12/03/2006

CLINICAL DATA: Syncope, chest pain

CHEST - 2 VIEW:

[w chest pa]
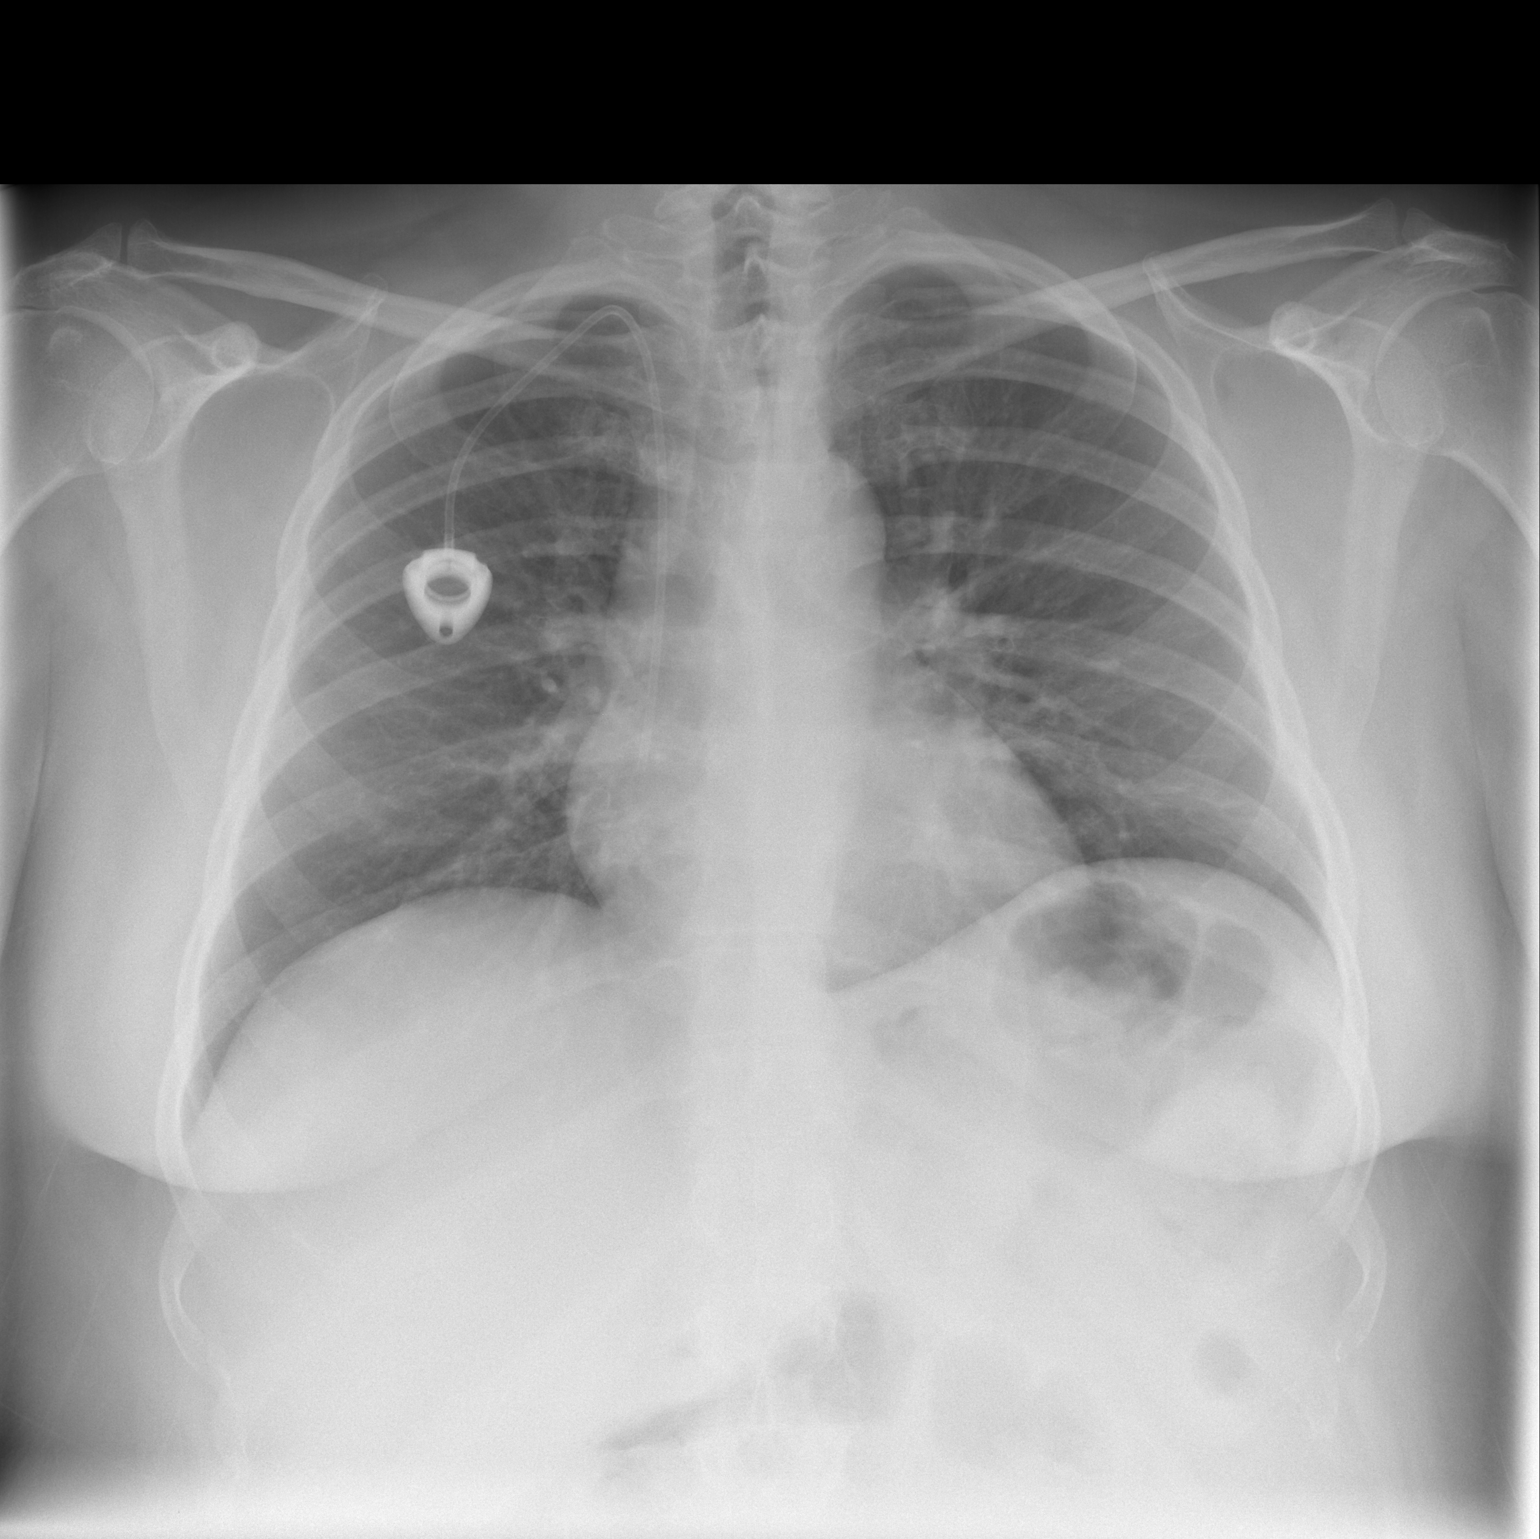

[w chest lat]
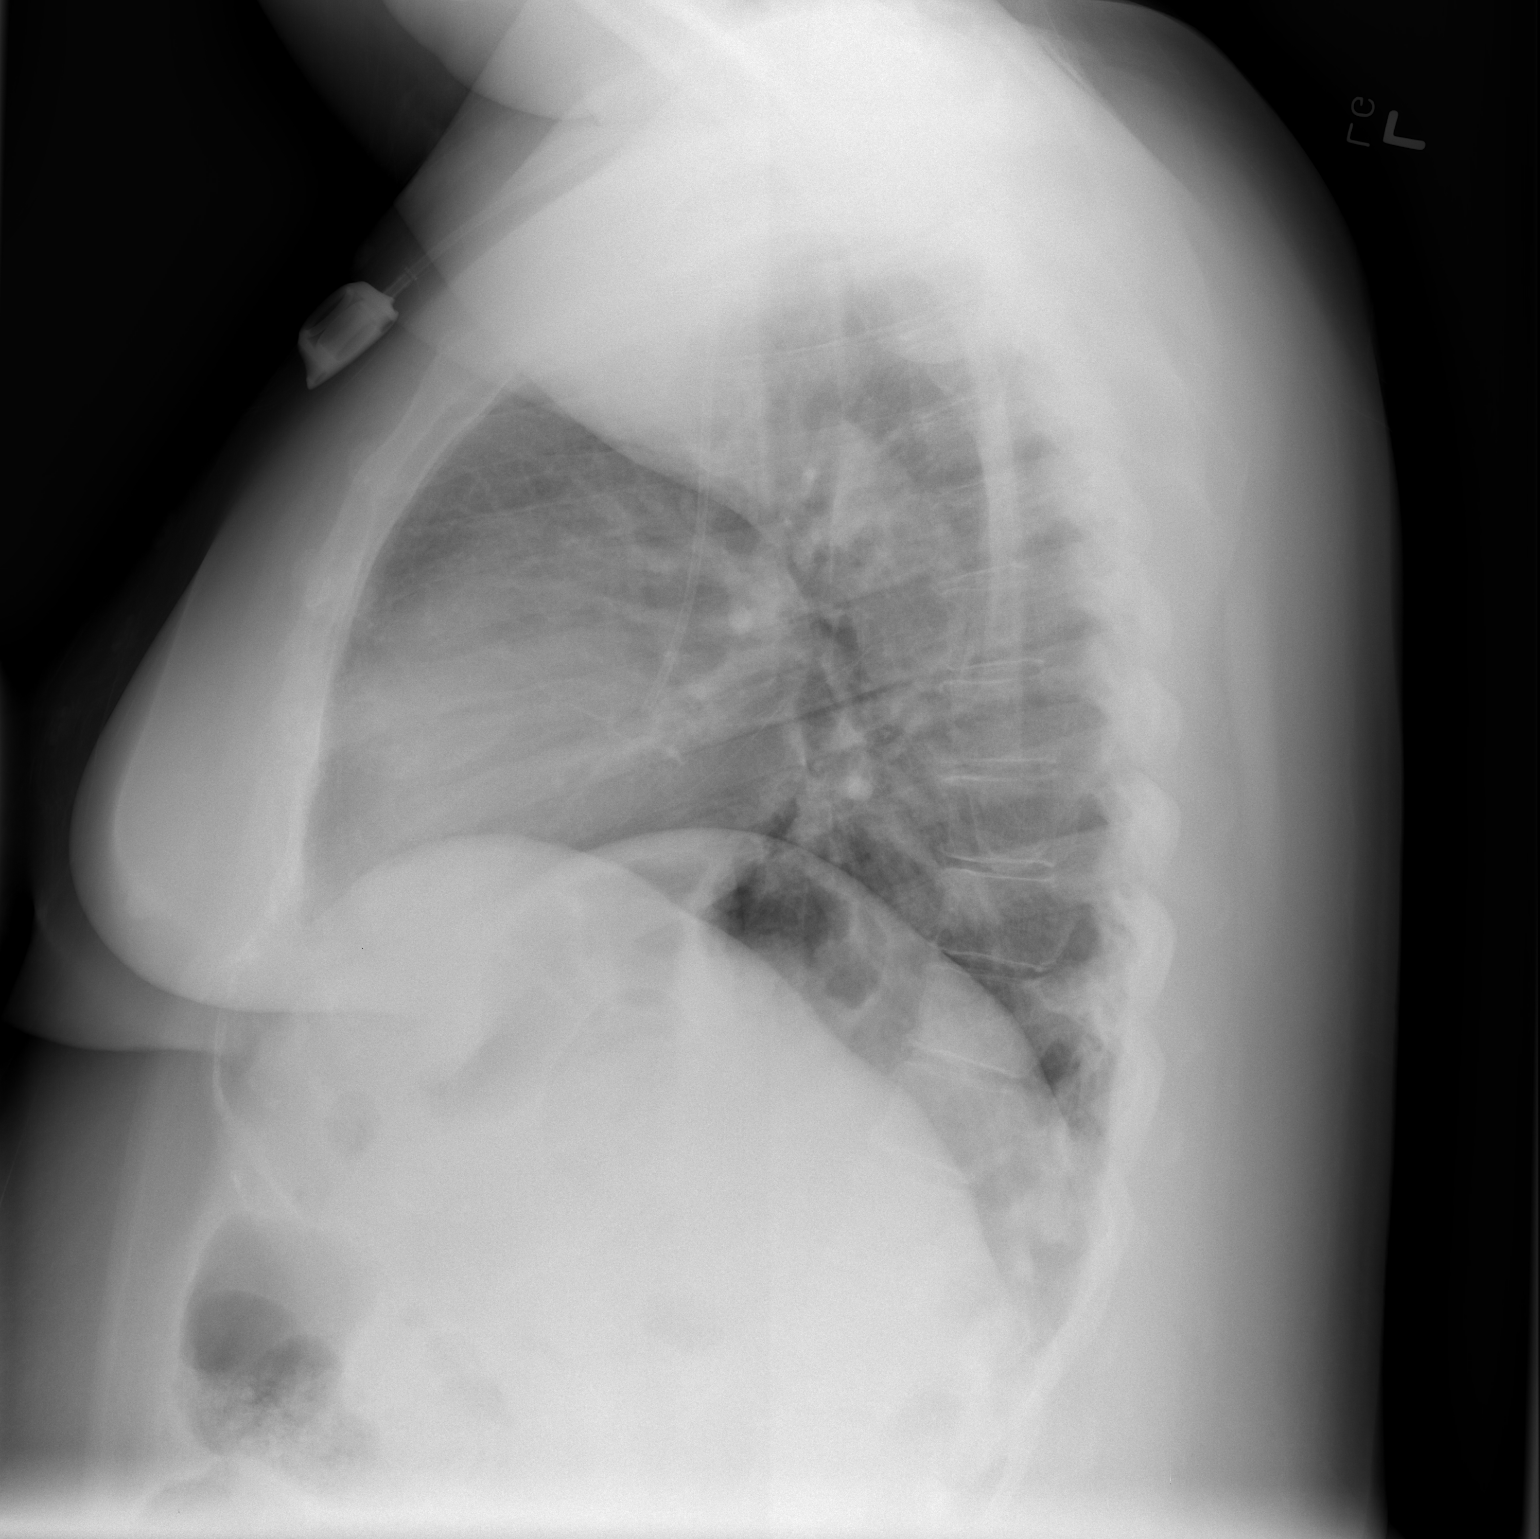

[2 of 2 positions shown; findings below may reference images not displayed]

FINDINGS: The heart size and mediastinal contours are within normal limits. 
Both lungs are clear.  The visualized skeletal structures are unremarkable.
Right Port-A-Cath is unchanged.
IMPRESSION: No active cardiopulmonary disease

## 2008-04-27 IMAGING — CR DG CHEST 1V PORT
1 series · 1 of 1 positions shown · non-contrast
Comparison: none

HISTORY: Dyspnea, chest pain

PORTABLE CHEST ONE VIEW:
Portable exam 7157 hours compared to 12/06/2006
Right jugular Port-A-Cath, tip right atrium, stable.
Upper normal heart size.
Normal mediastinal contours and pulmonary vascularity.
Lungs clear.
Bones unremarkable.

[view not recorded]
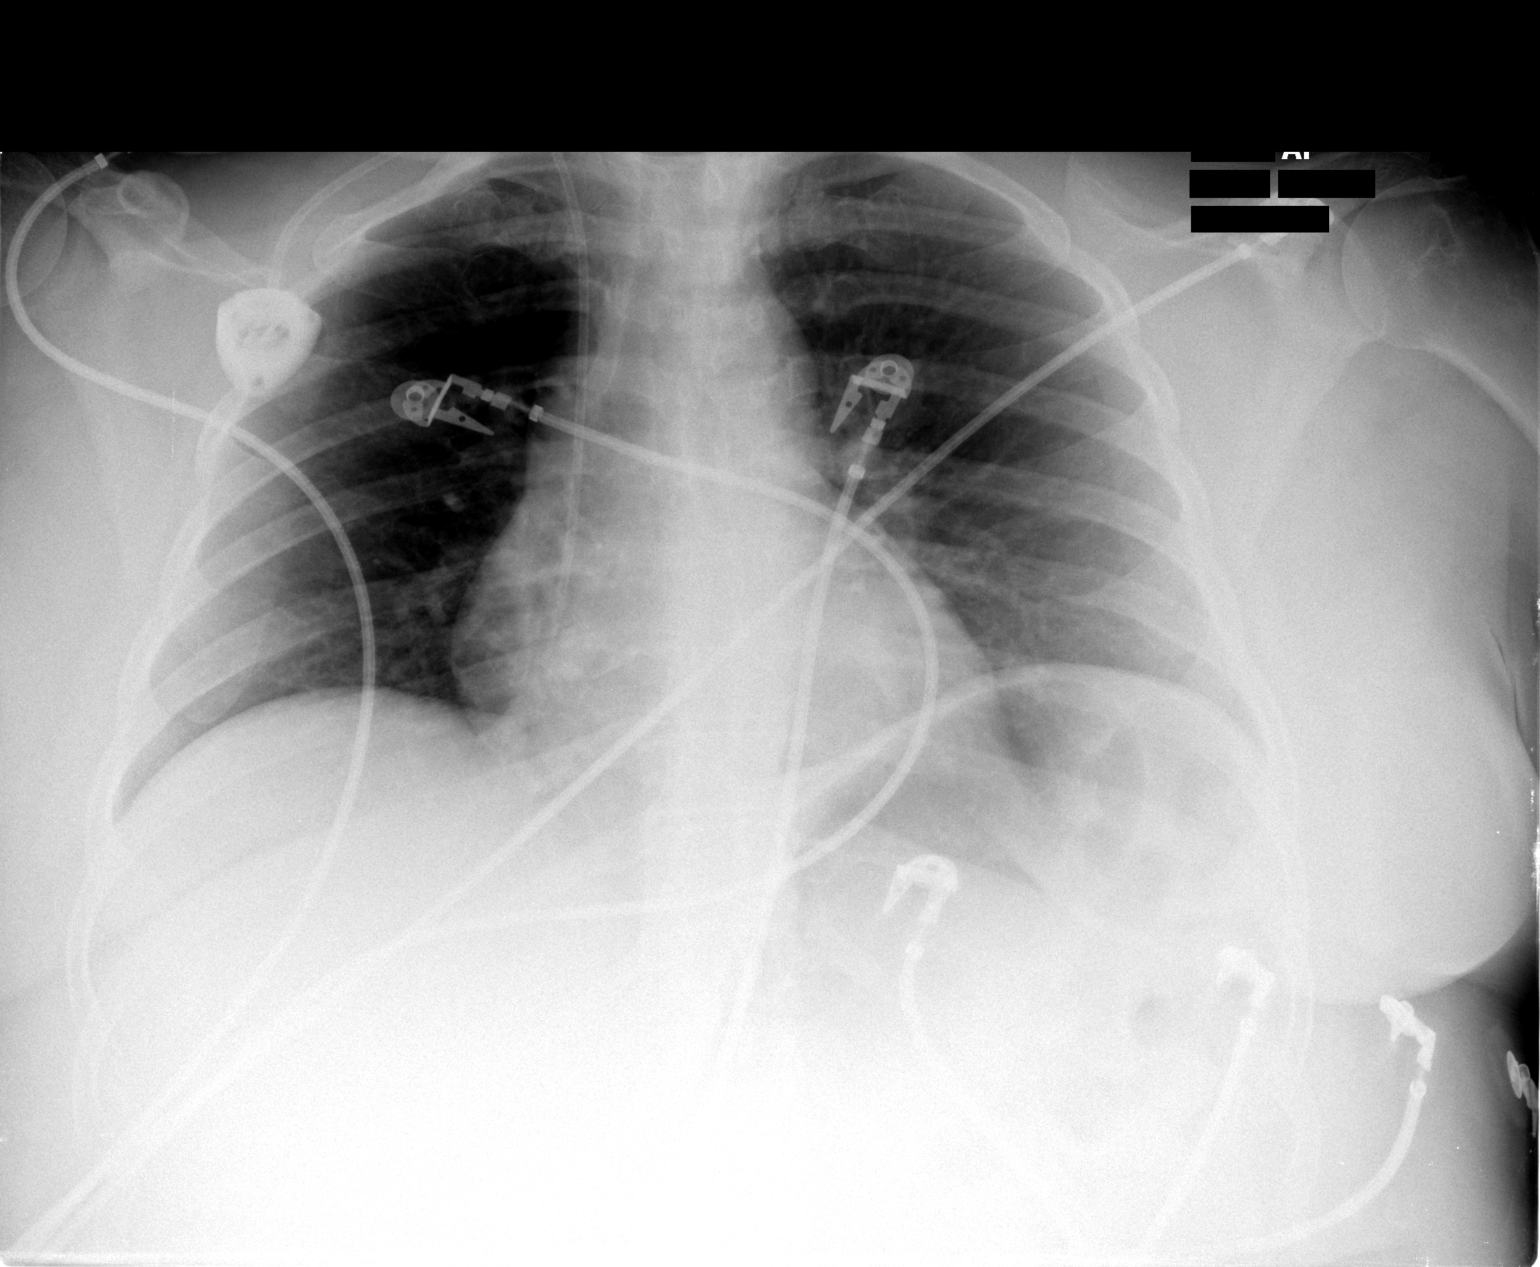

[1 of 1 positions shown; findings below may reference images not displayed]

IMPRESSION: No acute abnormalities.

## 2008-05-02 IMAGING — CR DG CHEST 2V
1 series · 1 of 1 positions shown · non-contrast
Comparison: 01/28/2007

CLINICAL DATA: Syncope.  
 CHEST - 2 VIEW:

[view not recorded]
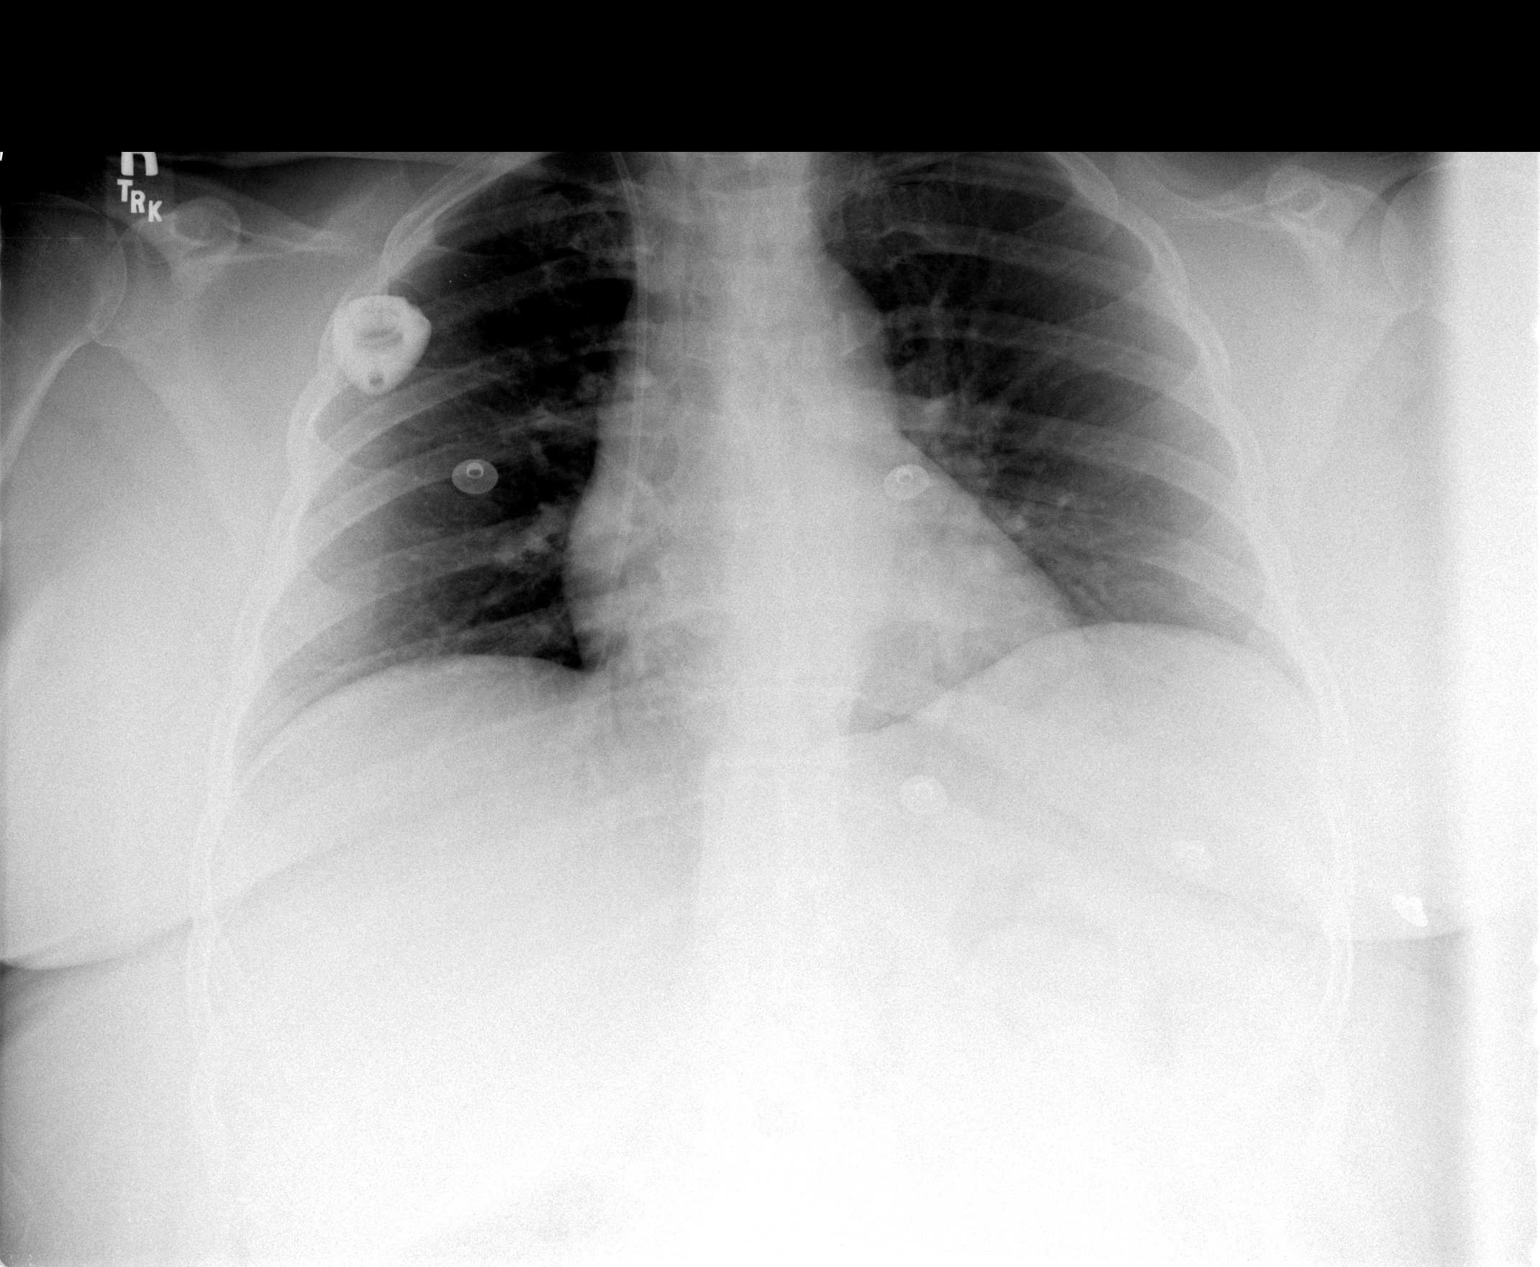

[1 of 1 positions shown; findings below may reference images not displayed]

FINDINGS: AP semierect and lateral views were obtained with the patient sitting.  
 Heart size within normal limits. No congestive heart failure or active disease.   Right Port-A-Cath is again noted without change.
IMPRESSION: No active disease.

## 2008-05-02 IMAGING — CT CT HEAD W/O CM
1 series · 16 of 30 positions shown, 20 images · IV contrast (agent unspecified)
Comparison: 03/22/06.

CLINICAL DATA: Syncope. 
 HEAD CT WITHOUT CONTRAST:
TECHNIQUE: Contiguous axial images were obtained from the base of the skull through the vertex according to standard protocol without contrast.

[Series 2: head routine 4.8 h47s · axial · 0.40mm/px · z∈[-85,+48]mm · 16 of 30 slices shown, 20 images]
[im 2/30  brain]
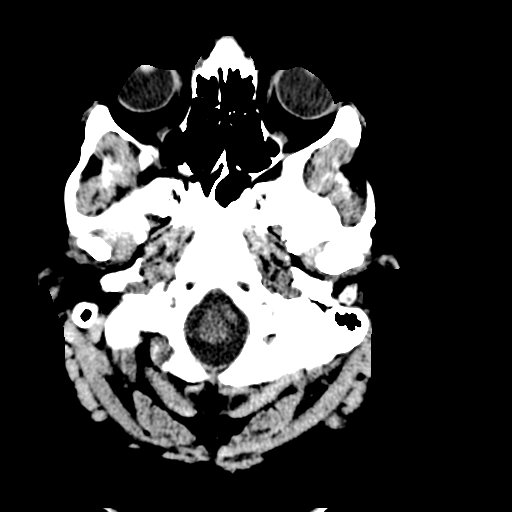
[im 2/30  bone]
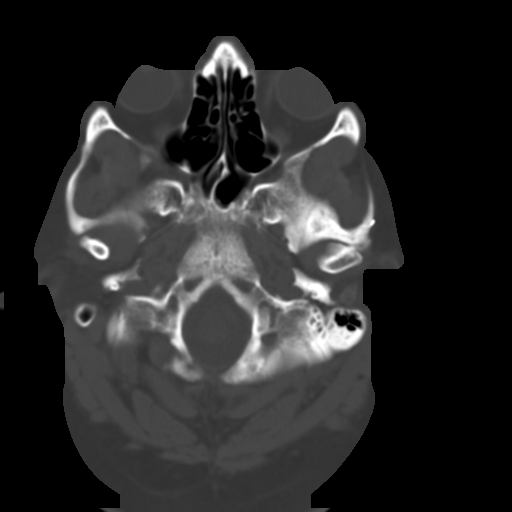
[im 4/30  brain]
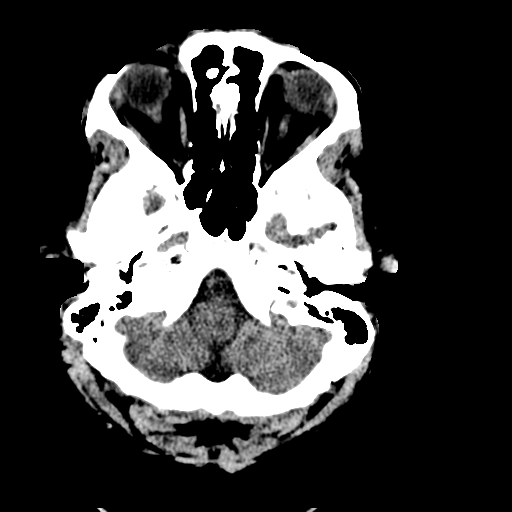
[im 6/30  brain]
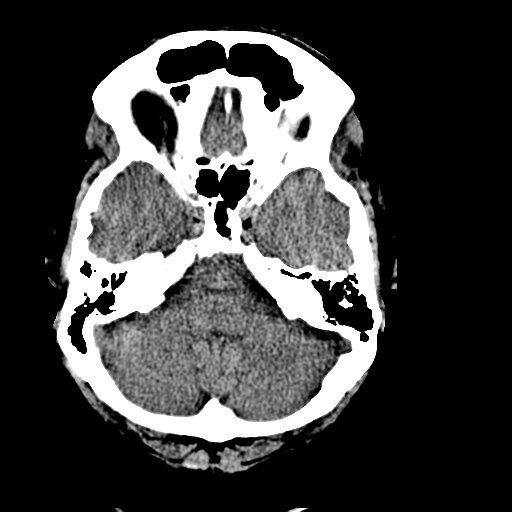
[im 8/30  brain]
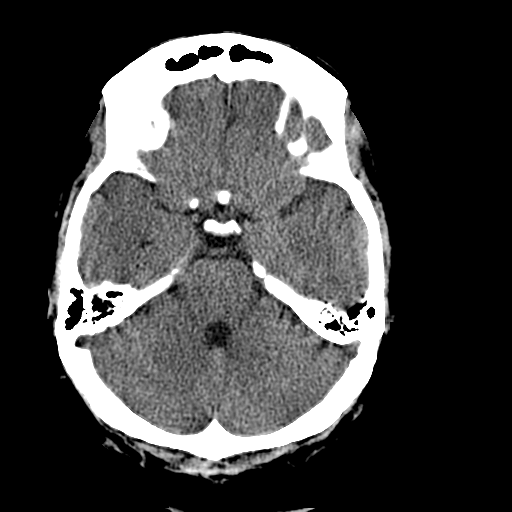
[im 9/30  brain]
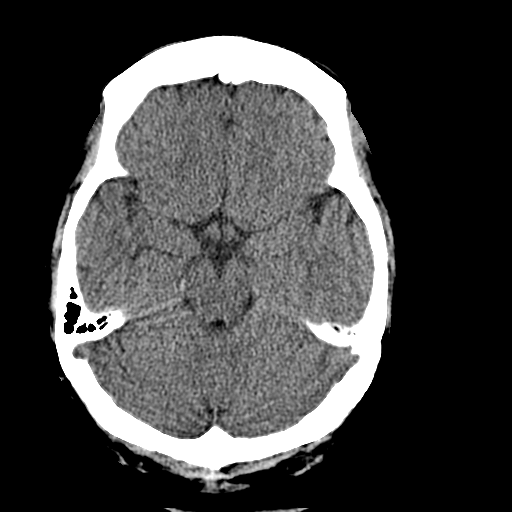
[im 9/30  bone]
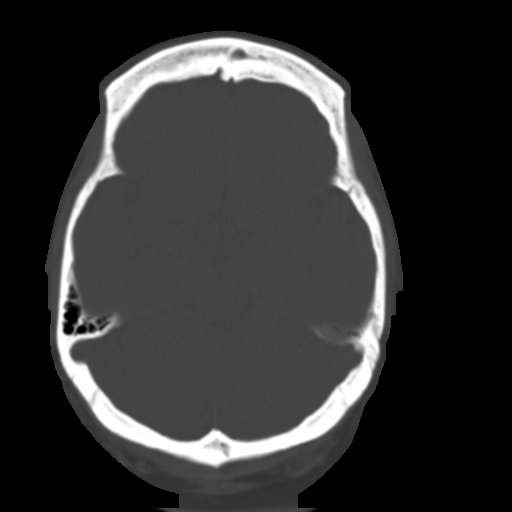
[im 11/30  brain]
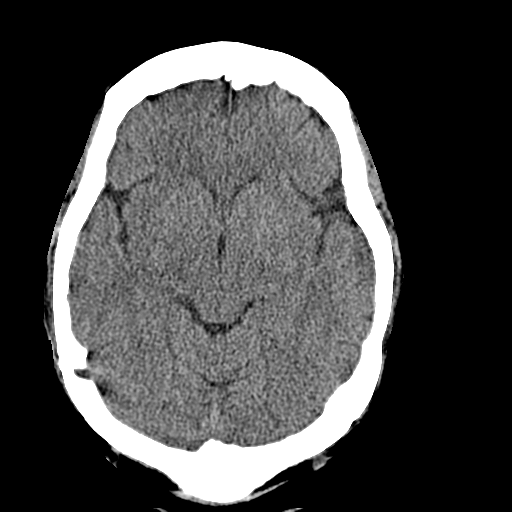
[im 13/30  brain]
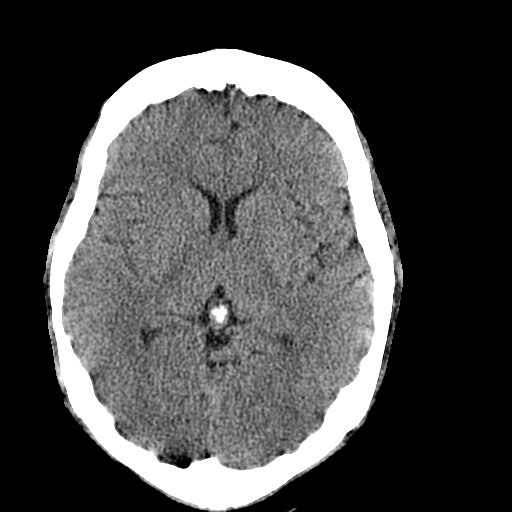
[im 15/30  brain]
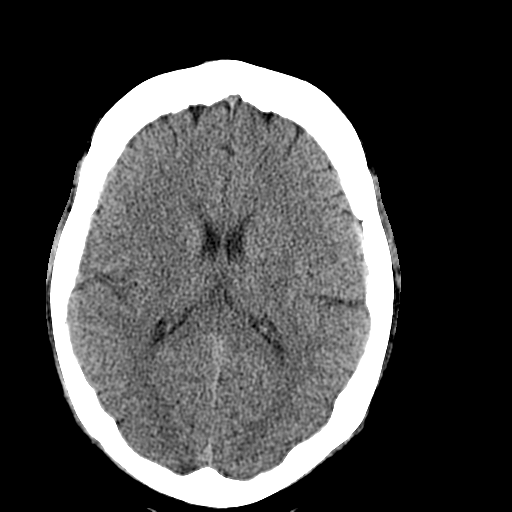
[im 16/30  brain]
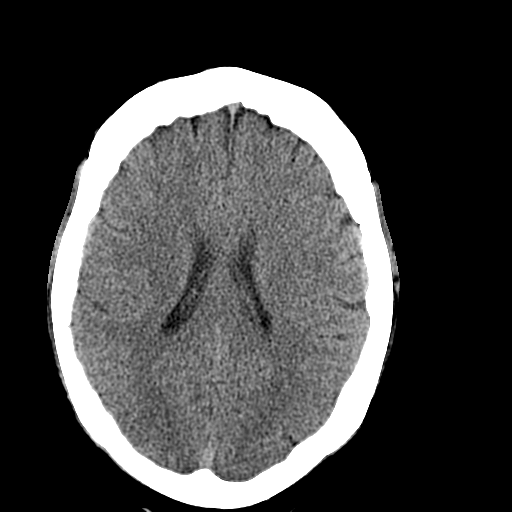
[im 16/30  bone]
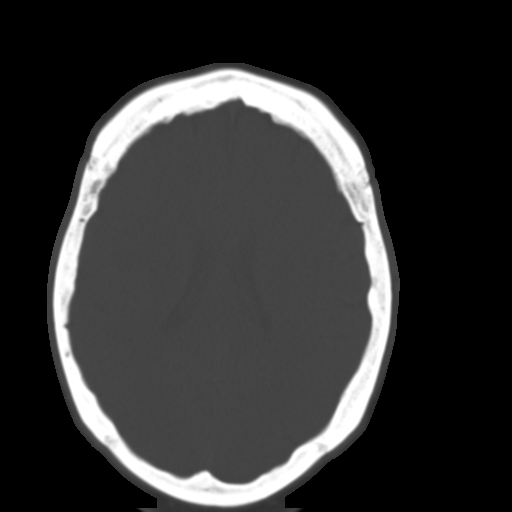
[im 18/30  brain]
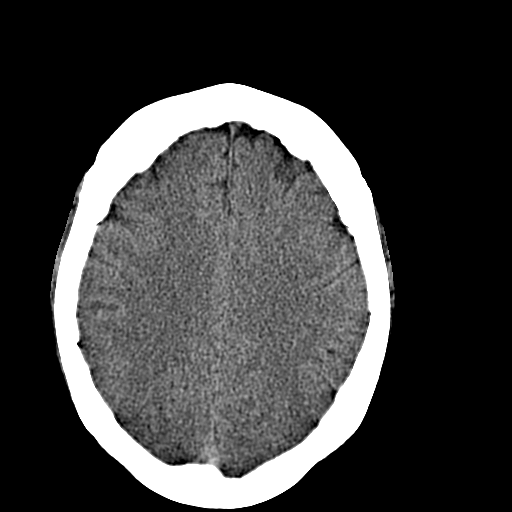
[im 20/30  brain]
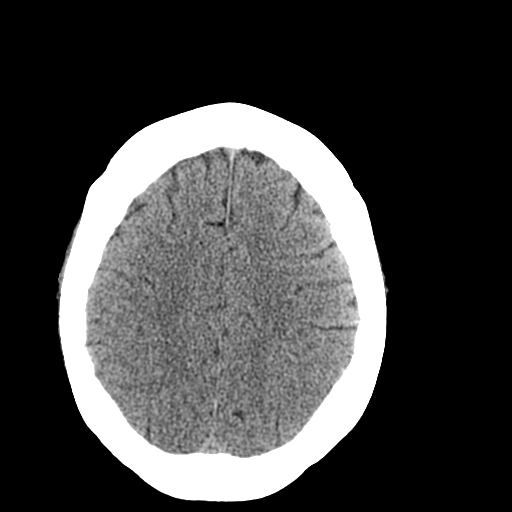
[im 22/30  brain]
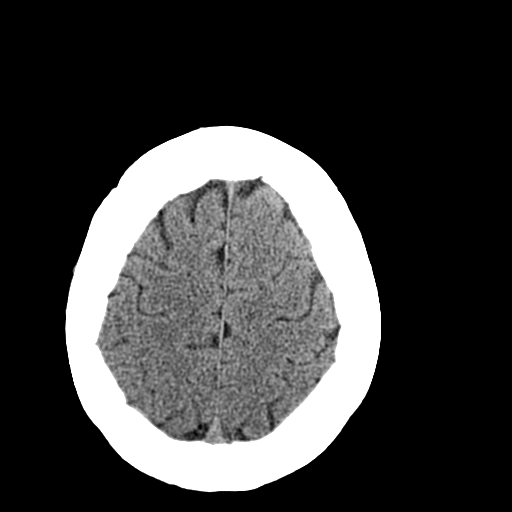
[im 23/30  brain]
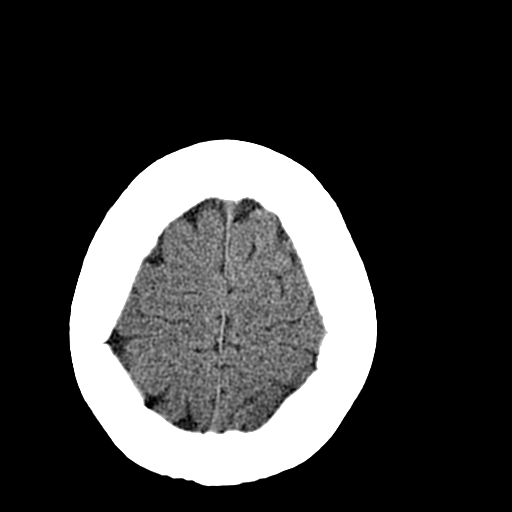
[im 23/30  bone]
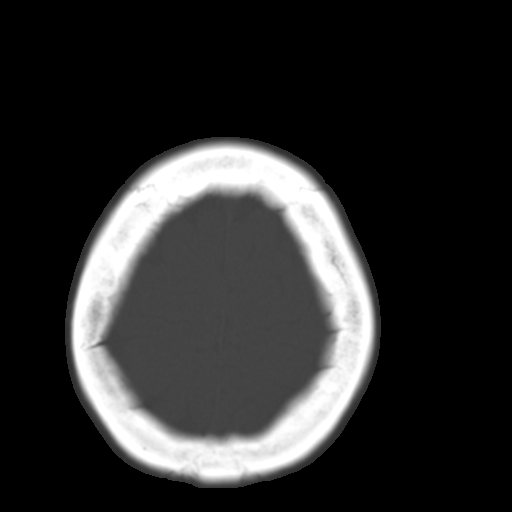
[im 25/30  brain]
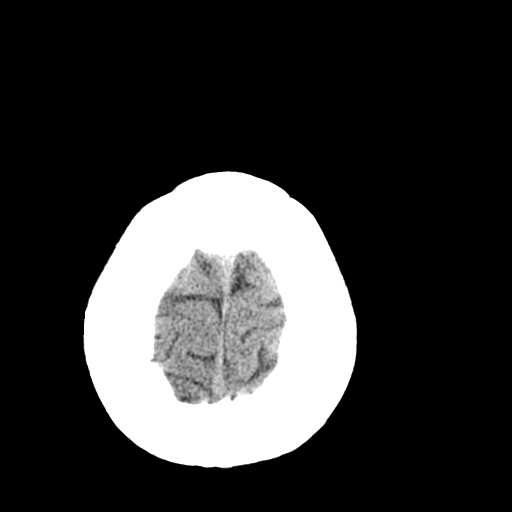
[im 27/30  brain]
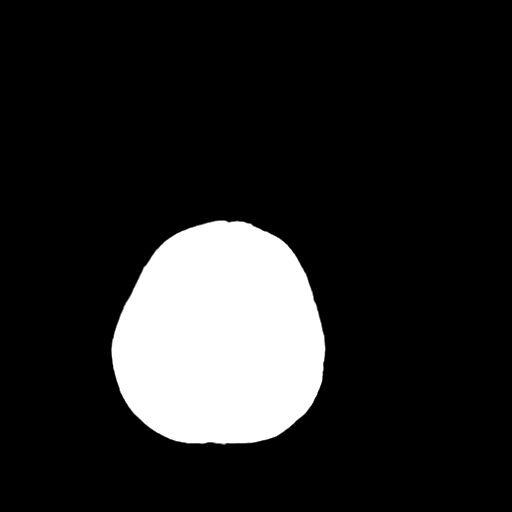
[im 29/30  brain]
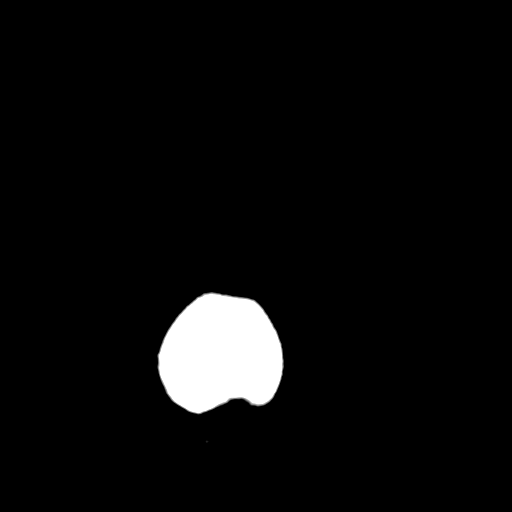

[16 of 30 positions shown; findings below may reference images not displayed]

FINDINGS: There is no evidence of intracranial hemorrhage, brain edema, acute infarct, mass lesion, or mass effect.  No other intra-axial abnormalities are seen, and the ventricles are within normal limits.  No abnormal extra-axial fluid collections or masses are identified.  No skull abnormalities are noted.
IMPRESSION: Negative non-contrast head CT.

## 2008-07-02 ENCOUNTER — Other Ambulatory Visit: Payer: Self-pay | Admitting: Emergency Medicine

## 2008-07-02 ENCOUNTER — Ambulatory Visit: Payer: Self-pay | Admitting: Psychiatry

## 2008-07-02 ENCOUNTER — Inpatient Hospital Stay (HOSPITAL_COMMUNITY): Admission: AD | Admit: 2008-07-02 | Discharge: 2008-07-05 | Payer: Self-pay | Admitting: Psychiatry

## 2008-09-15 ENCOUNTER — Emergency Department (HOSPITAL_COMMUNITY): Admission: EM | Admit: 2008-09-15 | Discharge: 2008-09-16 | Payer: Self-pay | Admitting: Emergency Medicine

## 2008-12-08 IMAGING — CR DG HAND COMPLETE 3+V*L*
3 series · 3 of 3 positions shown · non-contrast
Comparison: None

CLINICAL DATA: Blunt trauma to hand.  Pain.

LEFT HAND - COMPLETE 3+ VIEW

[x hand pa left]
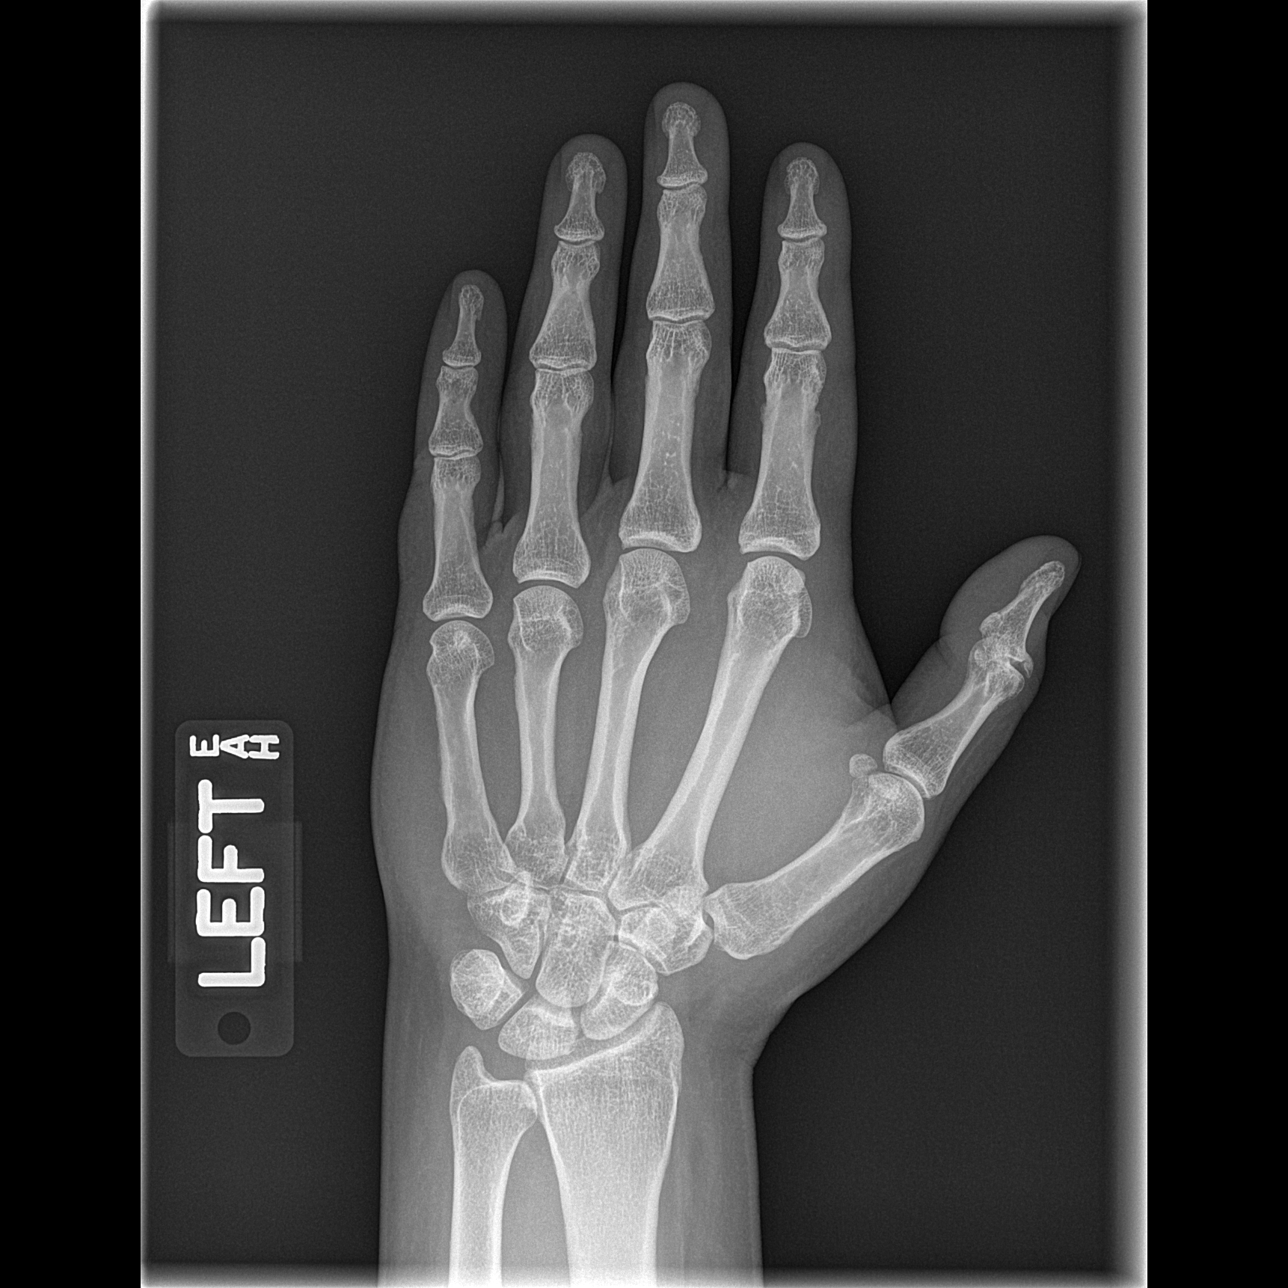

[x hand oblique left]
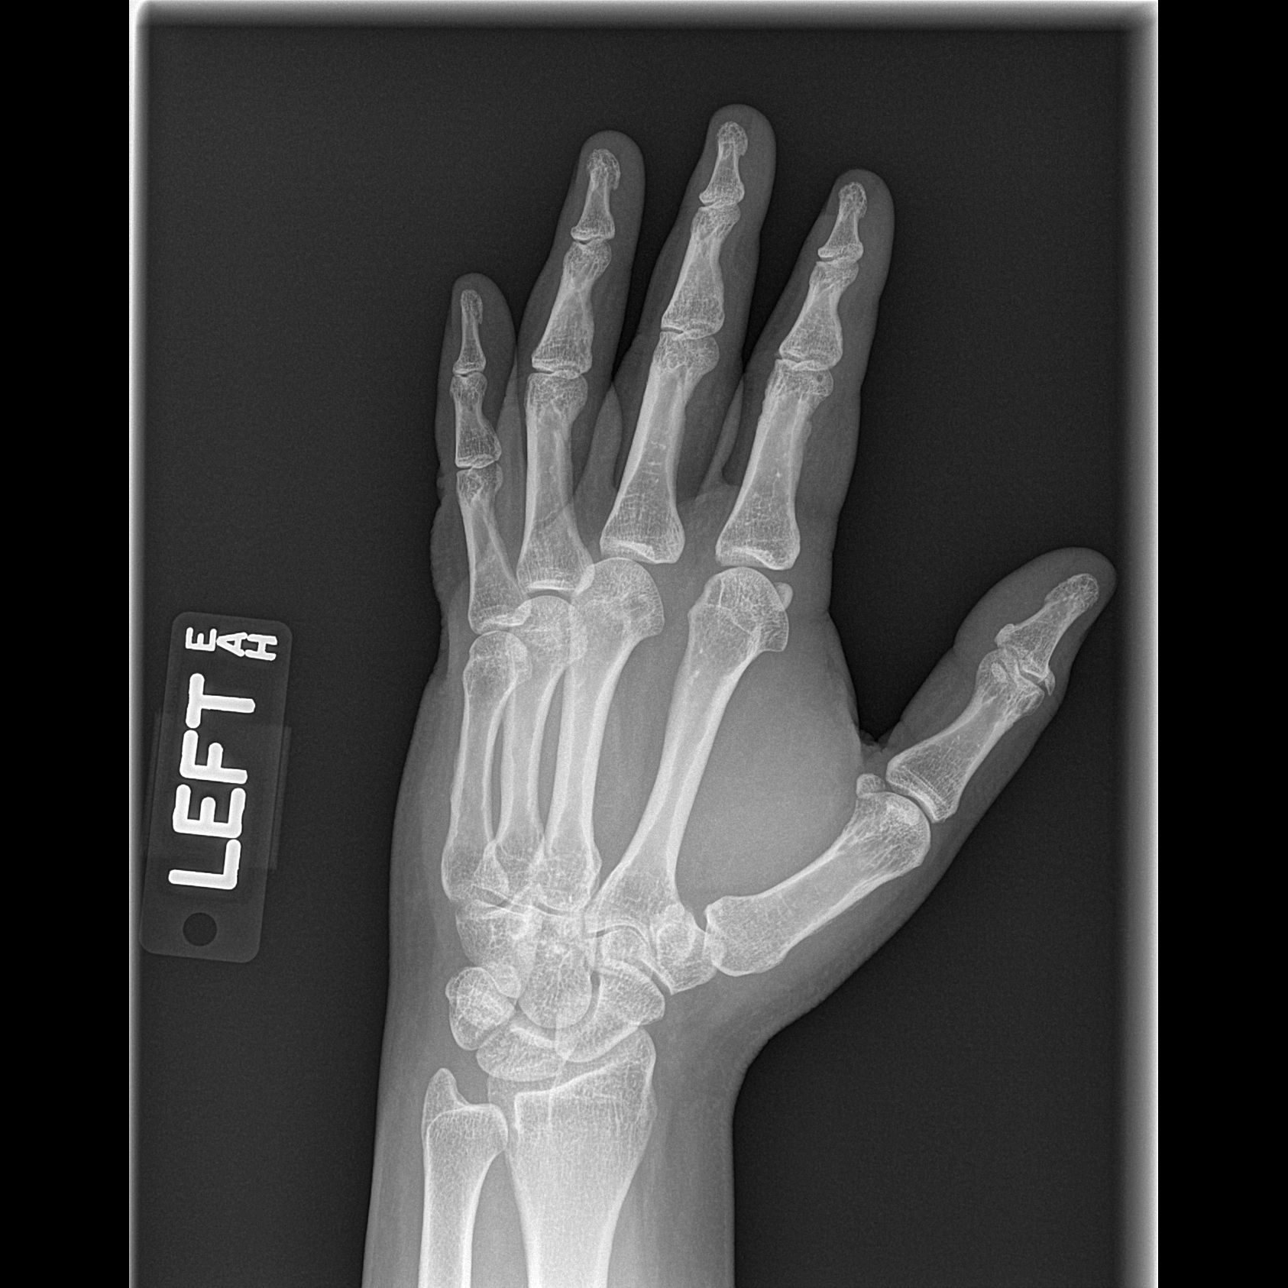

[x hand lat left]
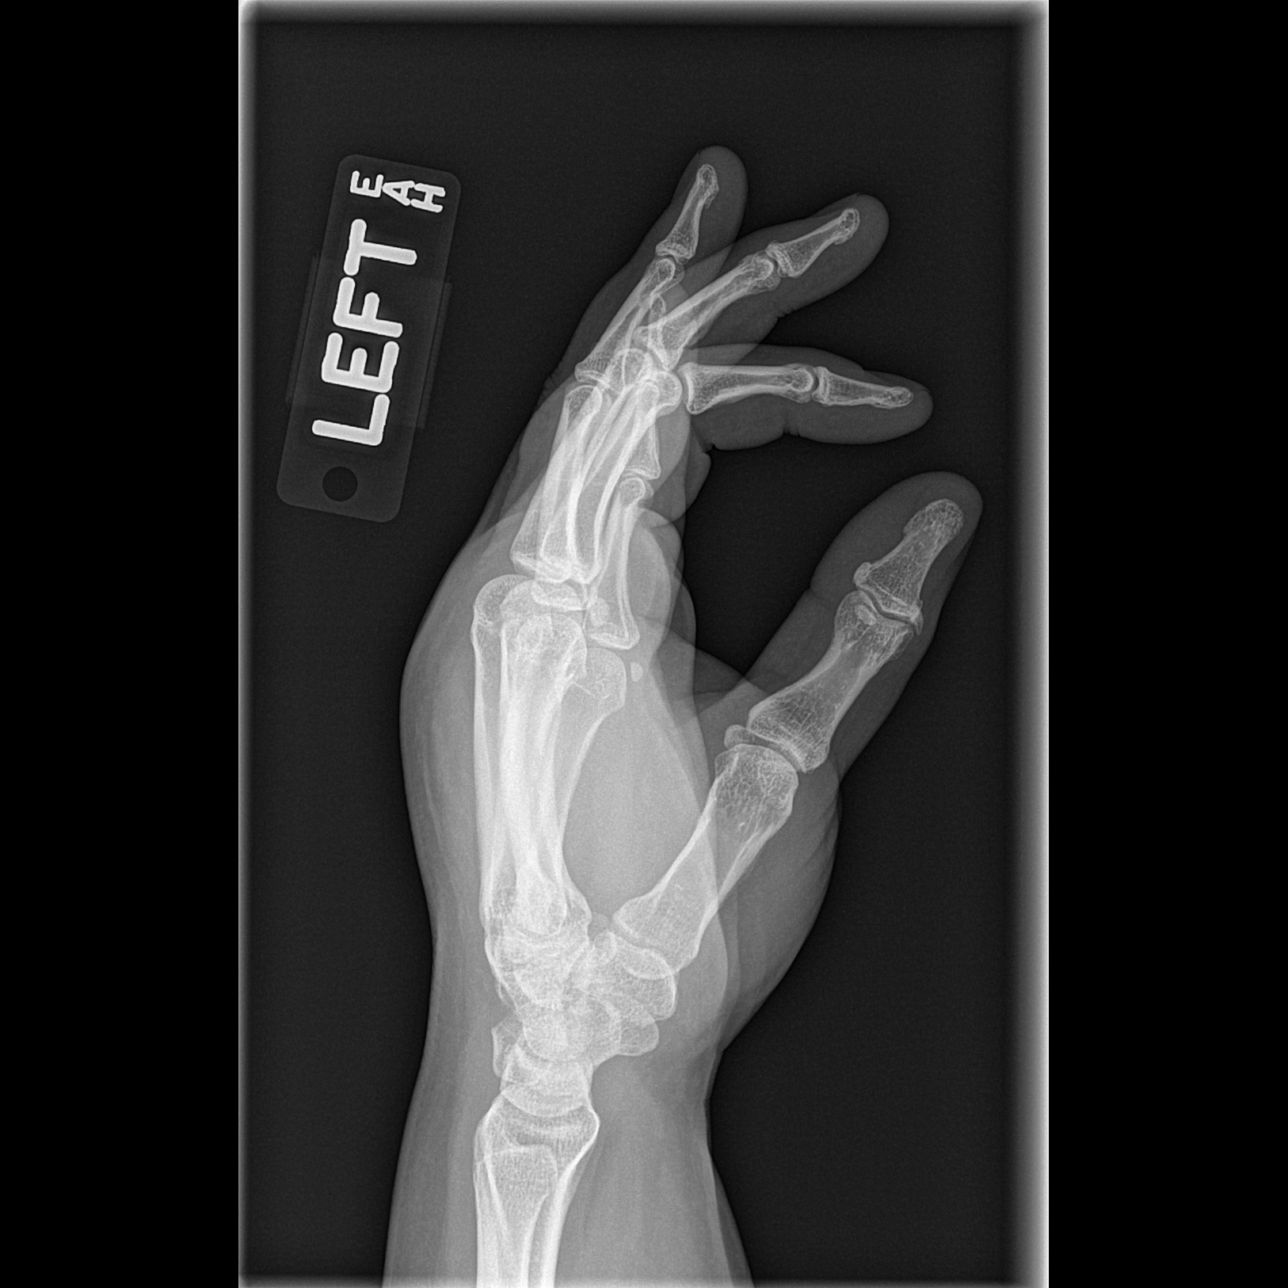

[3 of 3 positions shown; findings below may reference images not displayed]

FINDINGS: Soft tissue swelling is seen along the dorsal aspect of
the metacarpals.  There is no evidence of acute fracture or
dislocation.  Mild osteoarthritis is seen involving the
interphalangeal joint of the thumb.
IMPRESSION: Soft tissue swelling along the dorsal aspect metacarpals.  No
evidence of fracture or dislocation.

## 2008-12-19 IMAGING — CR DG CHEST 1V PORT
1 series · 1 of 1 positions shown · non-contrast
Comparison: 02/02/2007

CLINICAL DATA: Chest pain

PORTABLE CHEST - 1 VIEW

[AP]
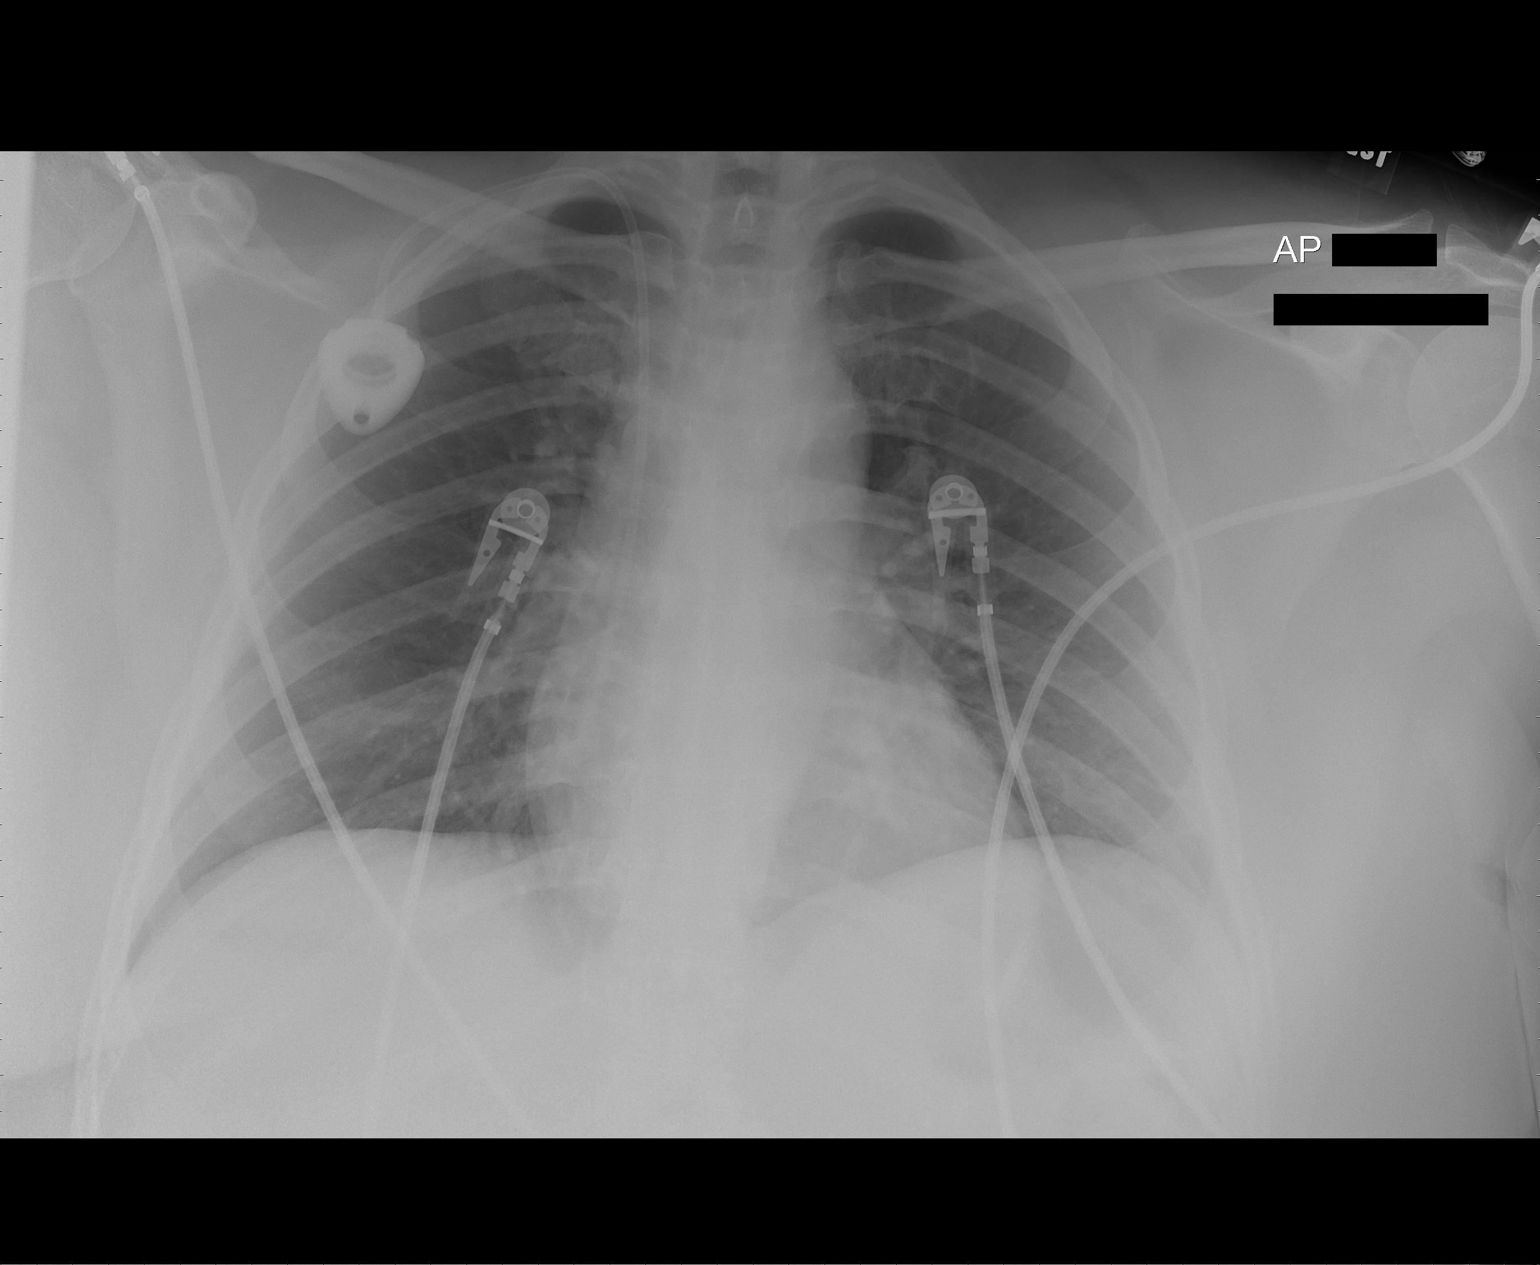

[1 of 1 positions shown; findings below may reference images not displayed]

FINDINGS: The cardiomediastinal silhouette is unremarkable.
A right-sided Port-A-Cath is stable.
No evidence of focal airspace disease, pleural effusions, or
pneumothorax.
No acute bony abnormalities are identified.
IMPRESSION: No evidence of acute cardiopulmonary disease.

## 2008-12-30 ENCOUNTER — Emergency Department (HOSPITAL_COMMUNITY): Admission: EM | Admit: 2008-12-30 | Discharge: 2008-12-30 | Payer: Self-pay | Admitting: Family Medicine

## 2008-12-30 ENCOUNTER — Emergency Department (HOSPITAL_COMMUNITY): Admission: EM | Admit: 2008-12-30 | Discharge: 2008-12-31 | Payer: Self-pay | Admitting: Emergency Medicine

## 2009-01-02 ENCOUNTER — Emergency Department: Payer: Self-pay | Admitting: Emergency Medicine

## 2009-01-04 IMAGING — CR DG CHEST 2V
2 series · 2 of 2 positions shown · non-contrast
Comparison: 09/21/2007

CLINICAL DATA: Chest pain and shortness of breath

CHEST - 2 VIEW

[w chest pa]
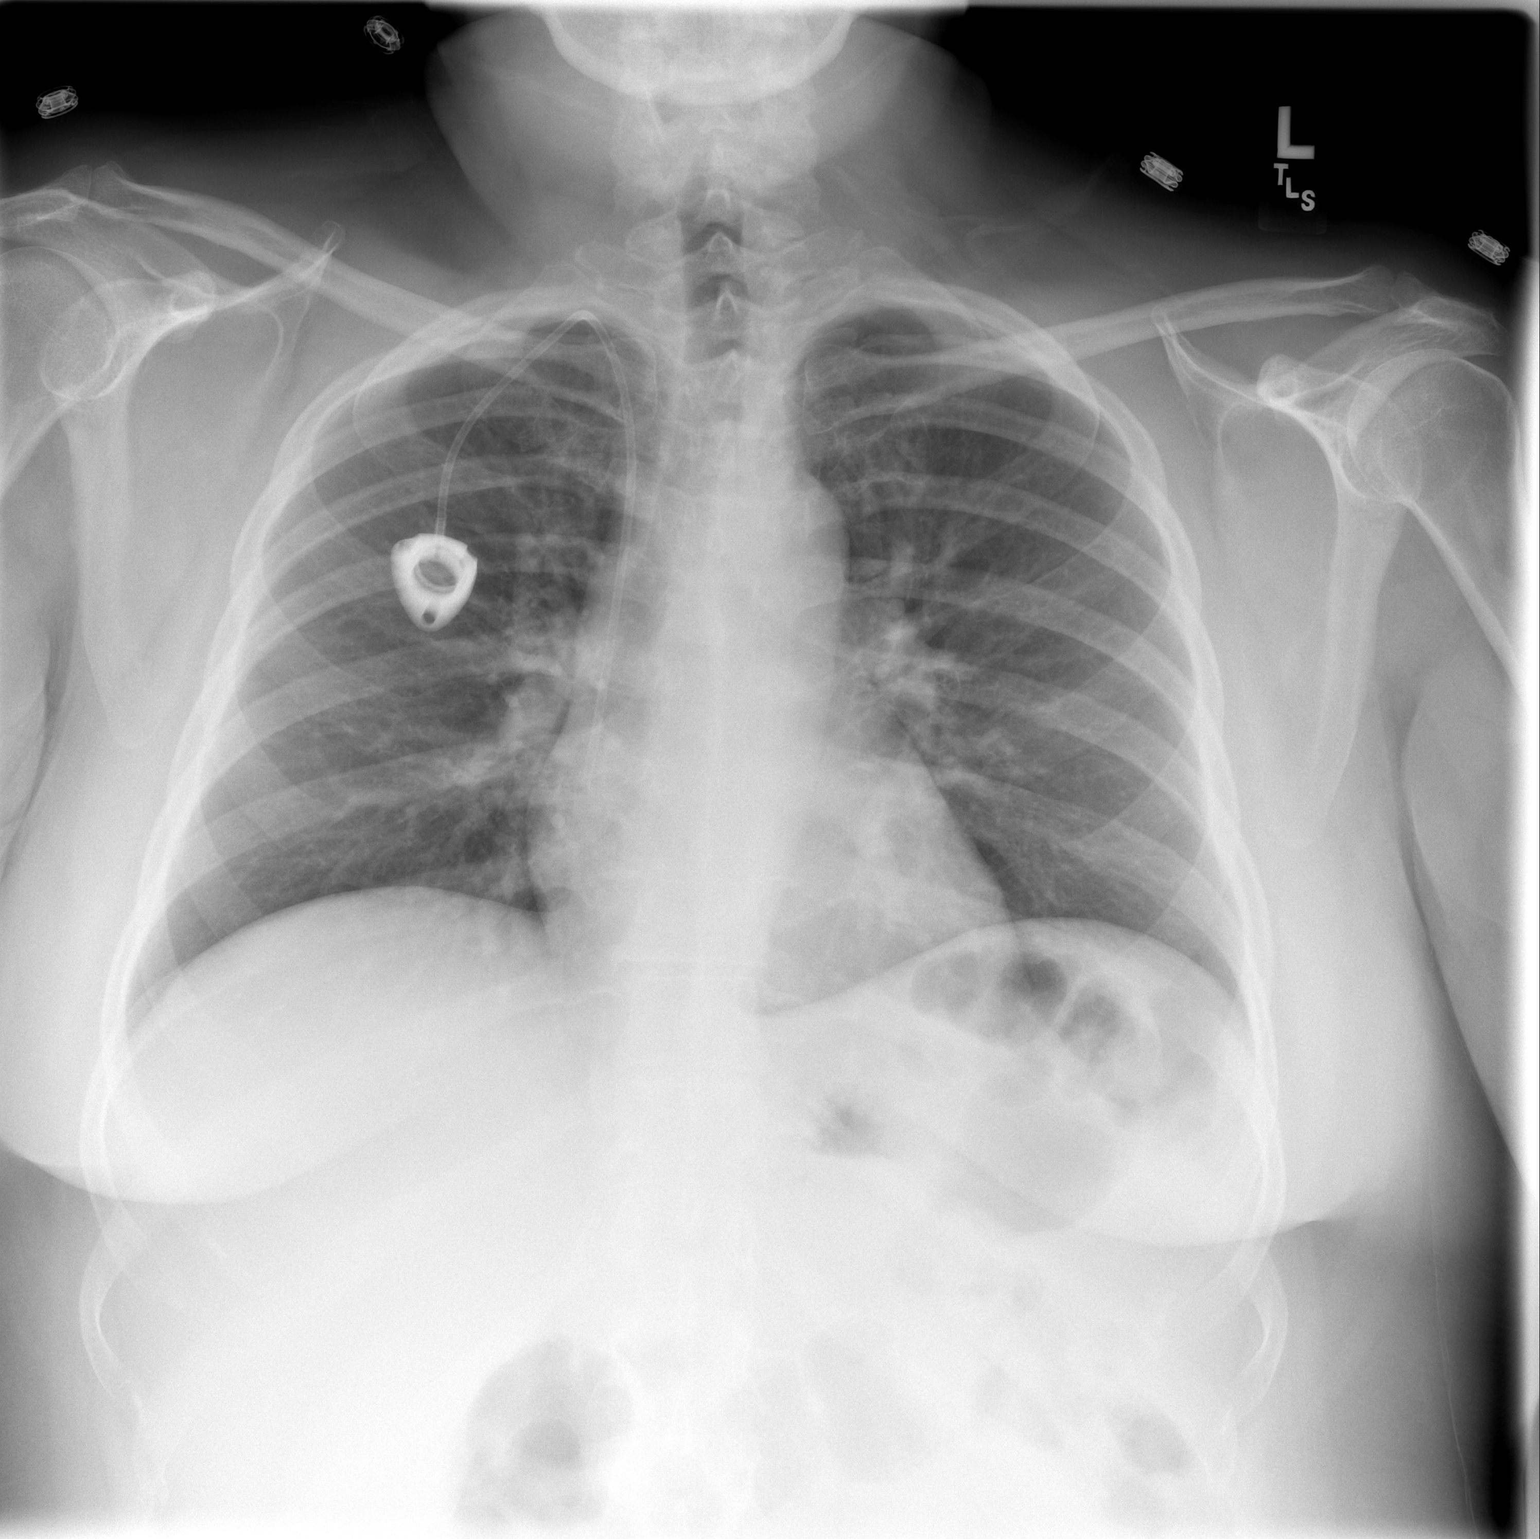

[w chest lat]
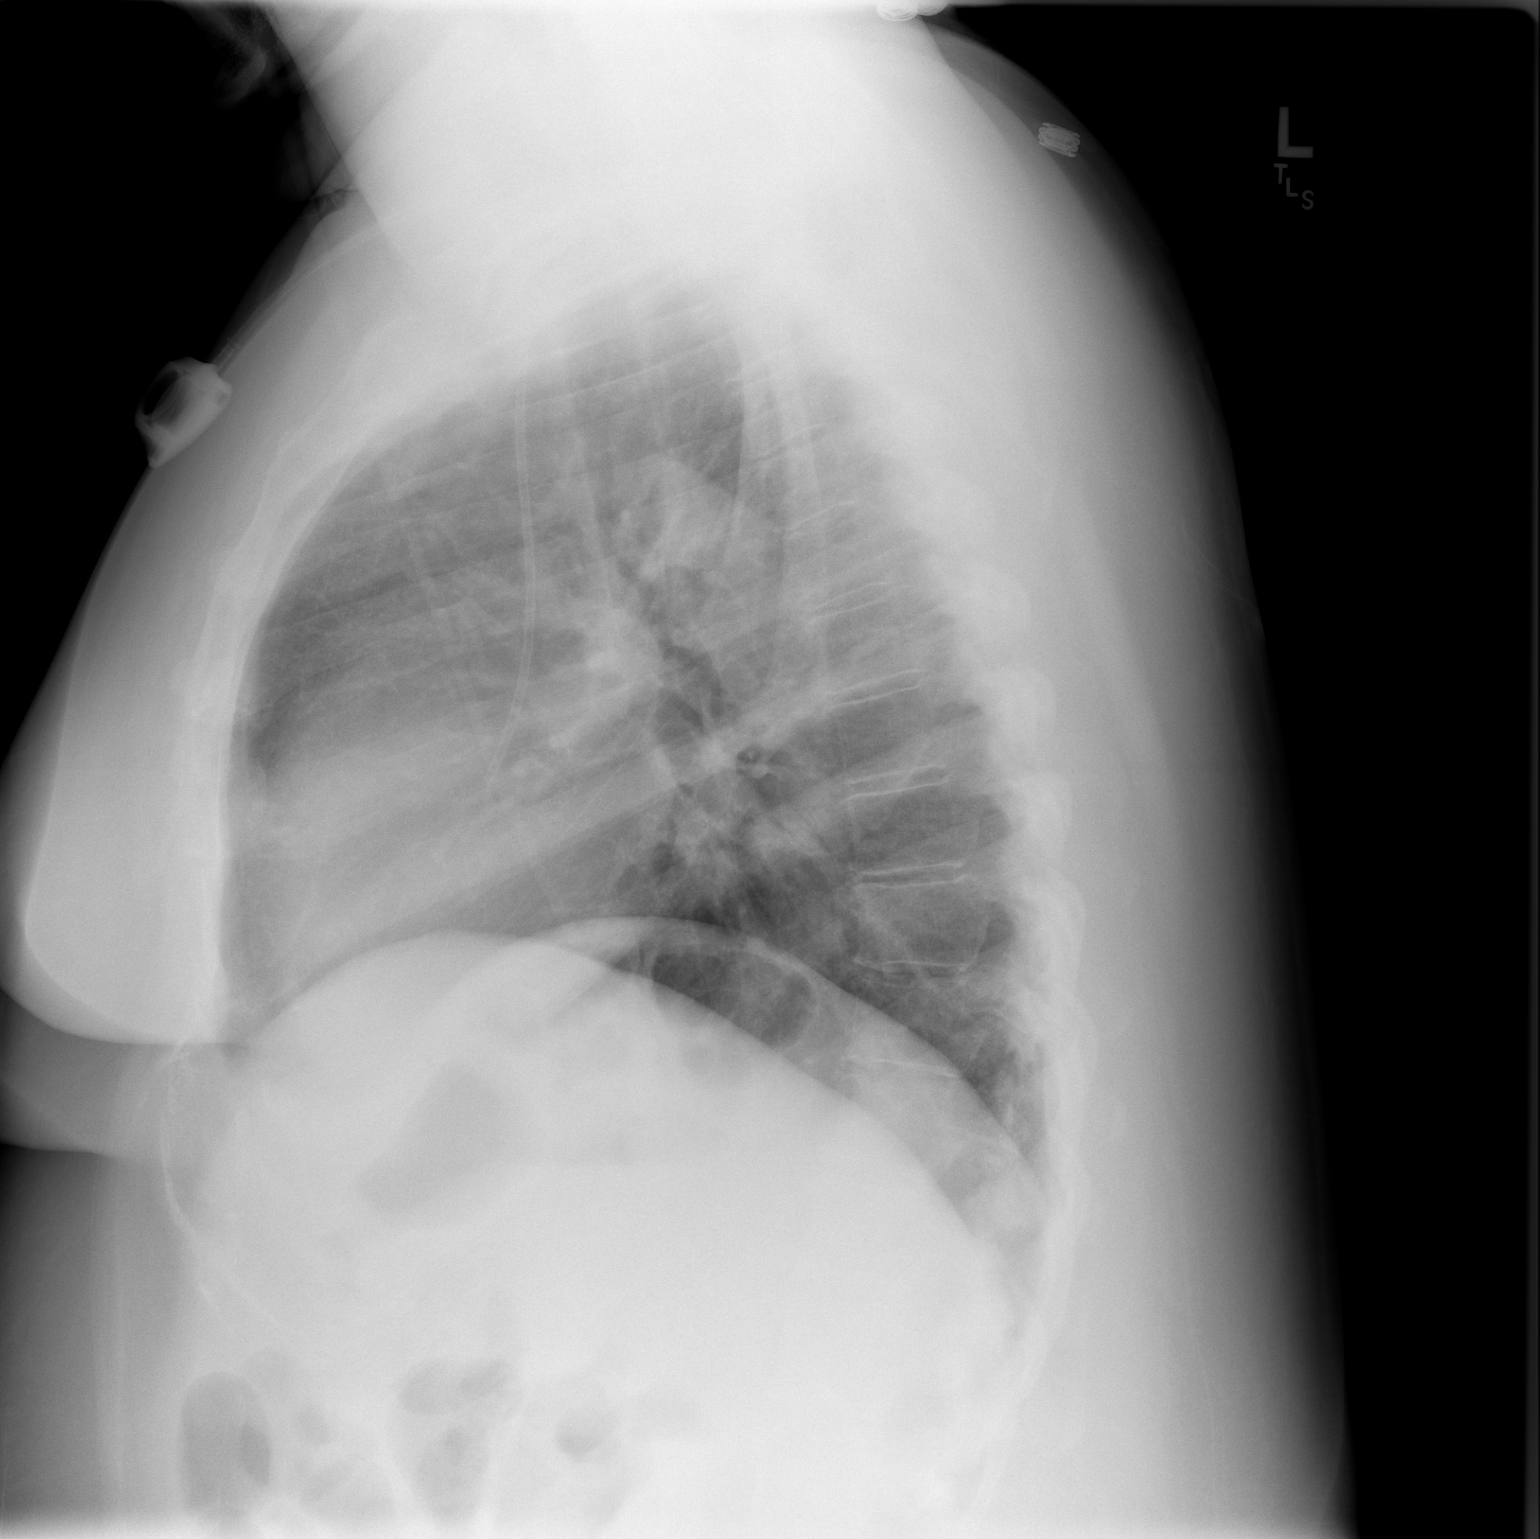

[2 of 2 positions shown; findings below may reference images not displayed]

FINDINGS: There is a right chest wall porta-catheter with tip in
the cavoatrial junction

The heart size and mediastinal contours are within normal limits.
Both lungs are clear.  The visualized skeletal structures are
unremarkable.
IMPRESSION: 1.  No active cardiopulmonary disease.

## 2009-01-21 ENCOUNTER — Emergency Department: Payer: Self-pay | Admitting: Emergency Medicine

## 2009-02-01 ENCOUNTER — Emergency Department (HOSPITAL_COMMUNITY): Admission: EM | Admit: 2009-02-01 | Discharge: 2009-02-01 | Payer: Self-pay | Admitting: Family Medicine

## 2009-02-23 ENCOUNTER — Emergency Department (HOSPITAL_COMMUNITY): Admission: EM | Admit: 2009-02-23 | Discharge: 2009-02-23 | Payer: Self-pay | Admitting: Emergency Medicine

## 2009-03-04 ENCOUNTER — Encounter: Admission: RE | Admit: 2009-03-04 | Discharge: 2009-03-04 | Payer: Self-pay | Admitting: Orthopaedic Surgery

## 2009-03-07 ENCOUNTER — Ambulatory Visit (HOSPITAL_COMMUNITY): Admission: RE | Admit: 2009-03-07 | Discharge: 2009-03-08 | Payer: Self-pay | Admitting: Otolaryngology

## 2009-03-16 ENCOUNTER — Emergency Department (HOSPITAL_COMMUNITY): Admission: EM | Admit: 2009-03-16 | Discharge: 2009-03-16 | Payer: Self-pay | Admitting: Emergency Medicine

## 2009-04-06 IMAGING — CT CT HEAD W/O CM
1 series · 16 of 30 positions shown, 20 images · non-contrast
Comparison: 02/02/2007

CLINICAL DATA: Hypertension, seizures, syncope, weakness,
dizziness, headache, visual changes

CT HEAD WITHOUT CONTRAST
TECHNIQUE: Contiguous axial images were obtained from the base of
the skull through the vertex without contrast.

[Series 2: head routine 4.8 h37s · axial · 0.43mm/px · z∈[+1125,+1257]mm · 16 of 30 slices shown, 20 images]
[im 2/30  brain]
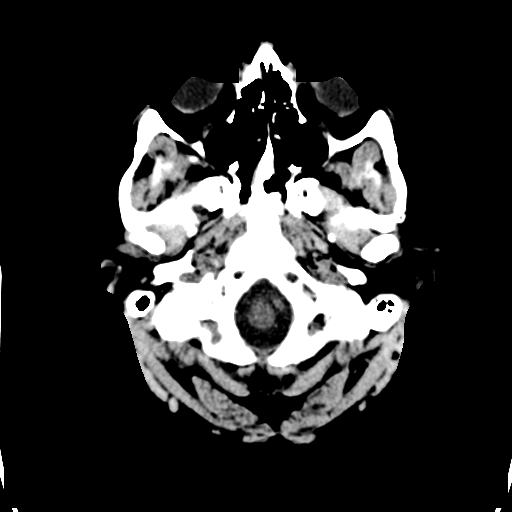
[im 2/30  bone]
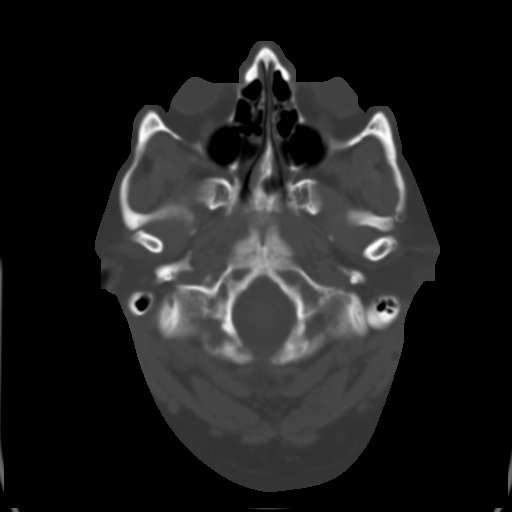
[im 4/30  brain]
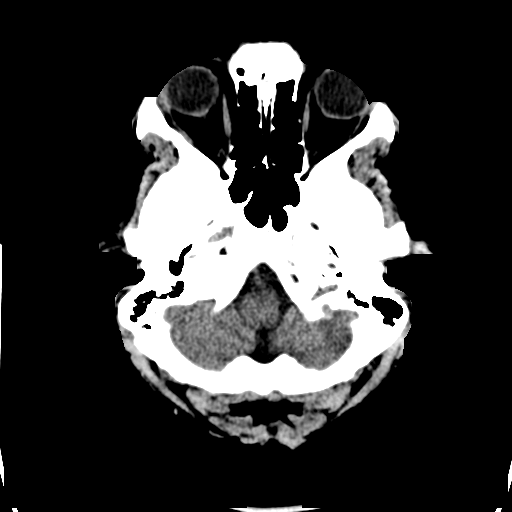
[im 6/30  brain]
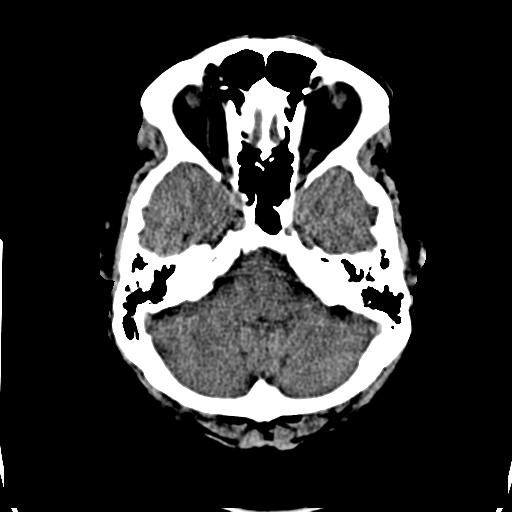
[im 8/30  brain]
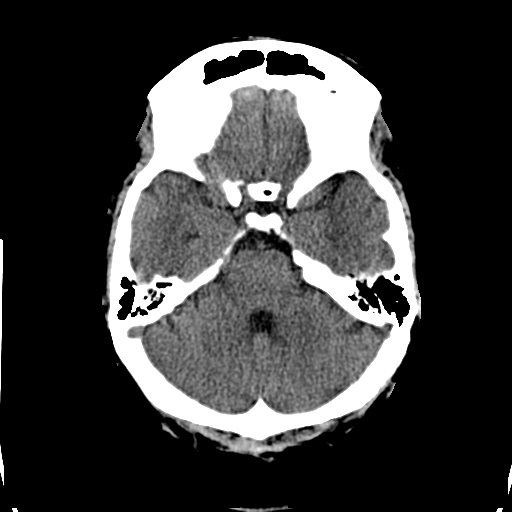
[im 9/30  brain]
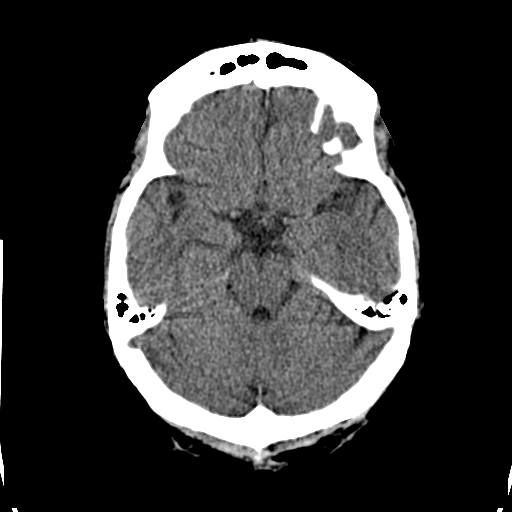
[im 9/30  bone]
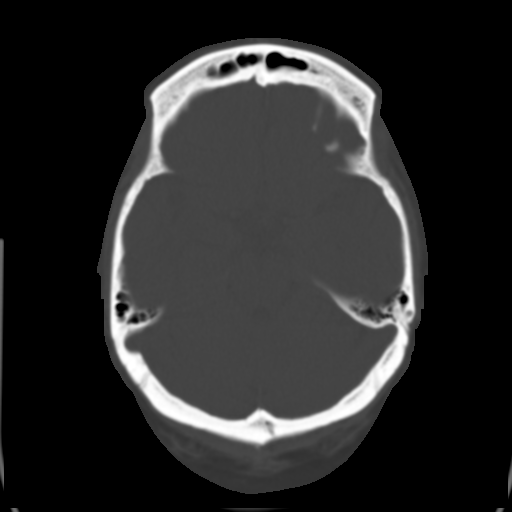
[im 11/30  brain]
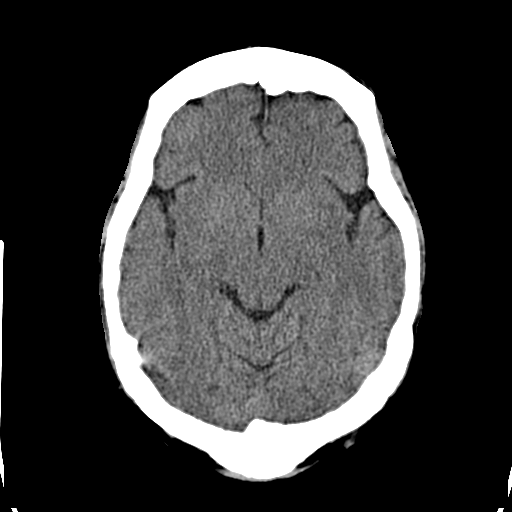
[im 13/30  brain]
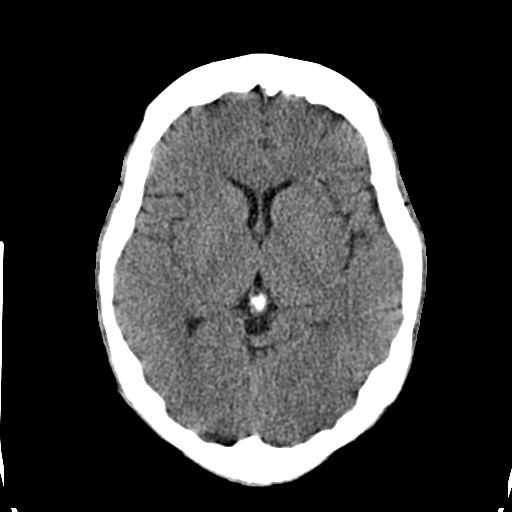
[im 15/30  brain]
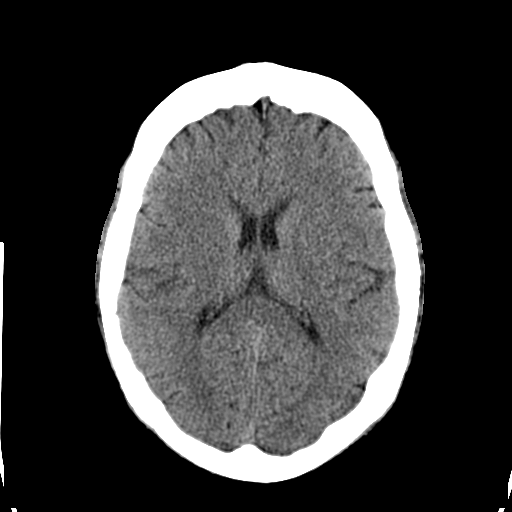
[im 16/30  brain]
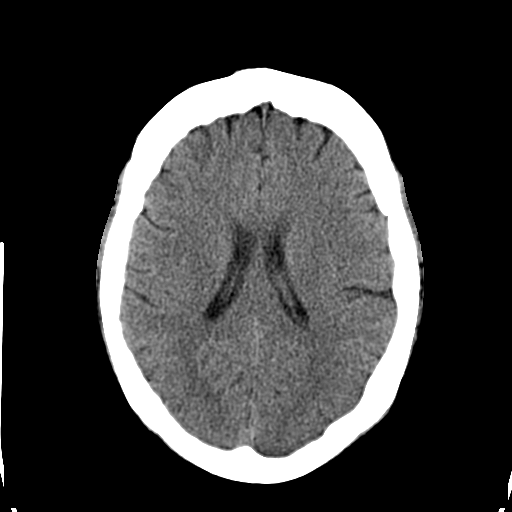
[im 16/30  bone]
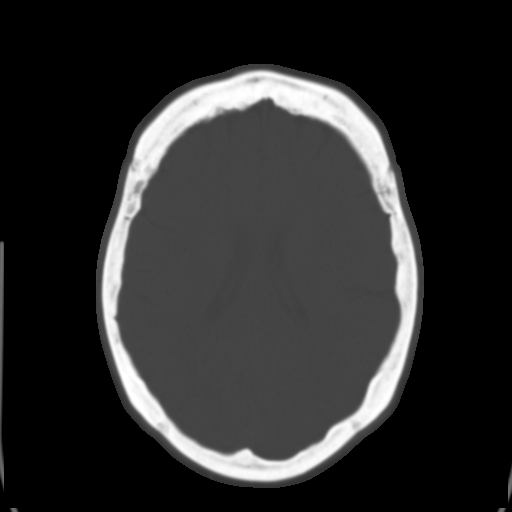
[im 18/30  brain]
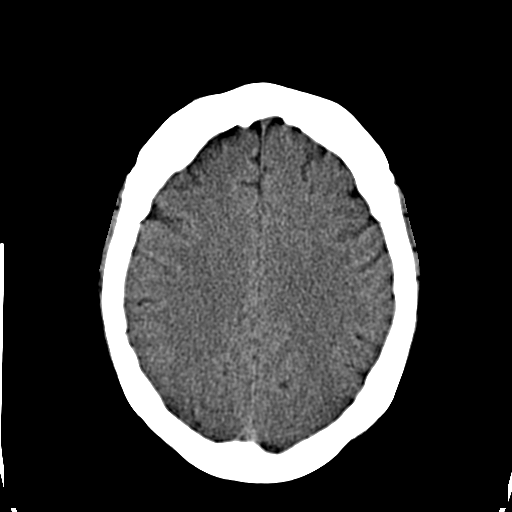
[im 20/30  brain]
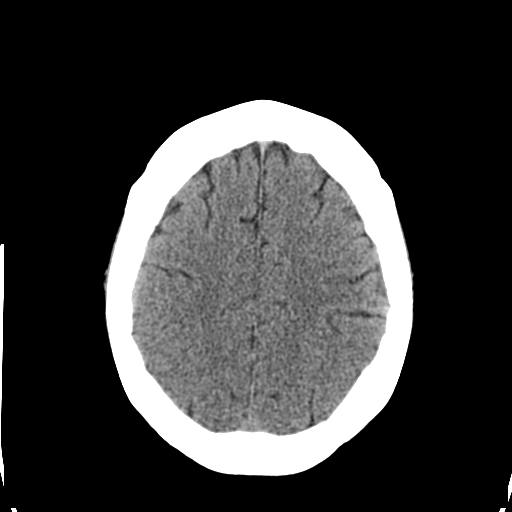
[im 22/30  brain]
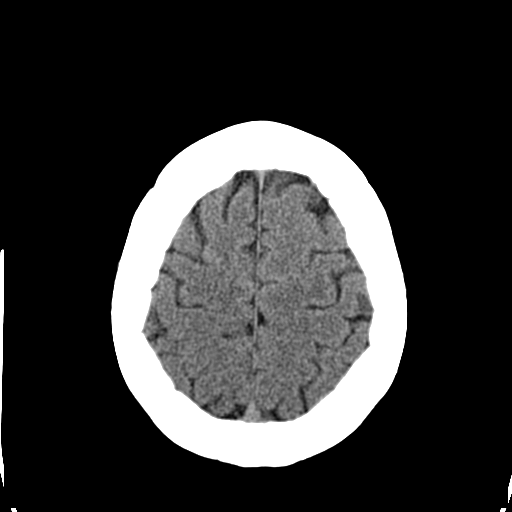
[im 23/30  brain]
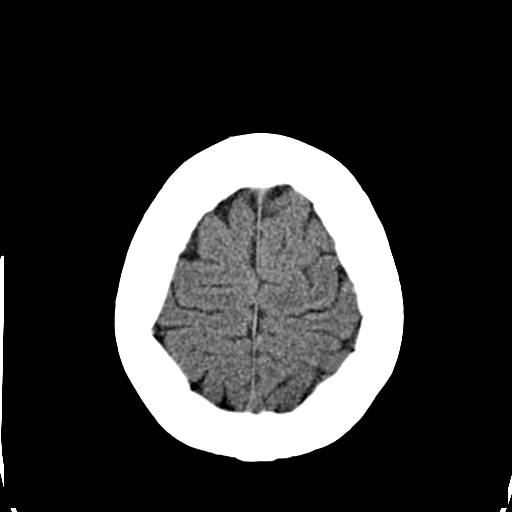
[im 23/30  bone]
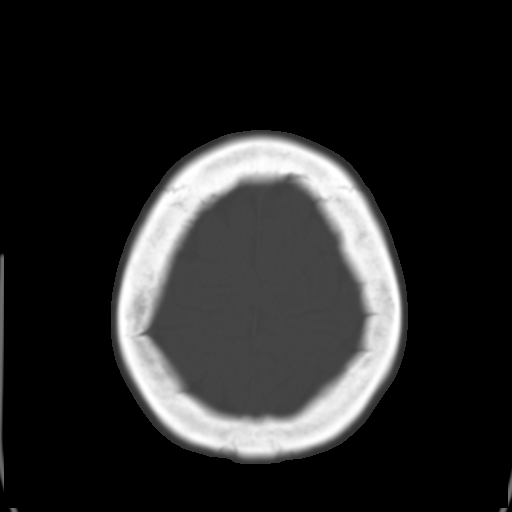
[im 25/30  brain]
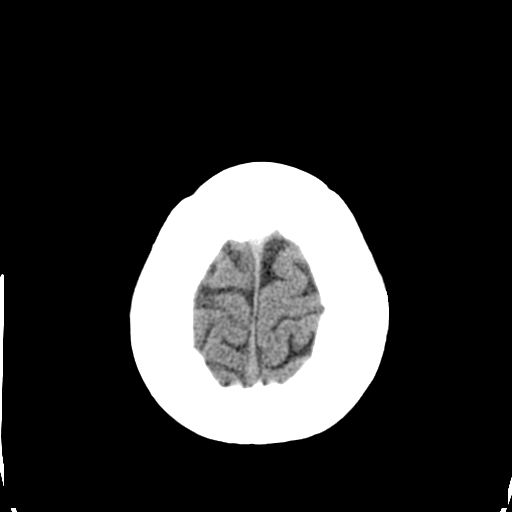
[im 27/30  brain]
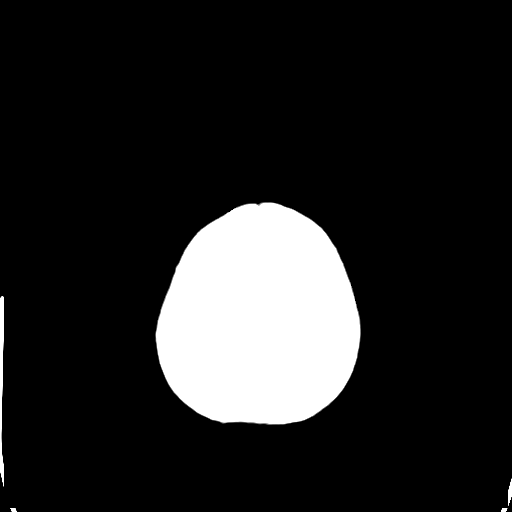
[im 29/30  brain]
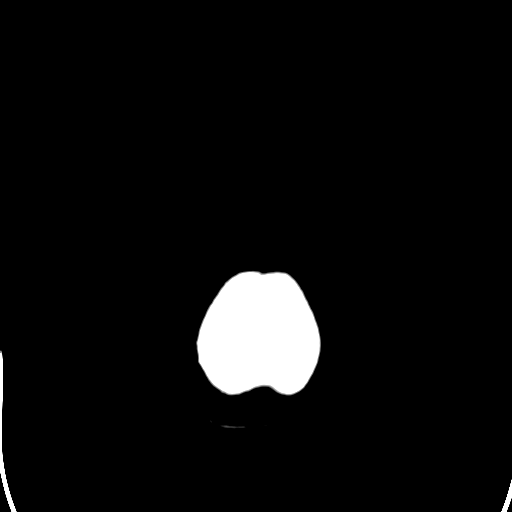

[16 of 30 positions shown; findings below may reference images not displayed]

FINDINGS: Normal ventricular morphology.
No midline shift or mass effect.
Normal appearance of brain parenchyma.
No intracranial hemorrhage, mass lesion, or acute infarct.
Visualized paranasal sinuses and mastoid air cells clear.
Skull intact.
IMPRESSION: No acute intracranial abnormalities.

## 2009-05-14 ENCOUNTER — Emergency Department (HOSPITAL_COMMUNITY): Admission: EM | Admit: 2009-05-14 | Discharge: 2009-05-14 | Payer: Self-pay | Admitting: Family Medicine

## 2009-05-18 ENCOUNTER — Emergency Department (HOSPITAL_COMMUNITY): Admission: EM | Admit: 2009-05-18 | Discharge: 2009-05-18 | Payer: Self-pay | Admitting: Emergency Medicine

## 2009-05-29 IMAGING — CR DG CHEST 1V PORT
1 series · 1 of 1 positions shown · non-contrast
Comparison: PA and lateral chest [DATE].

CLINICAL DATA: Drug overdose.

PORTABLE CHEST - 1 VIEW

[view not recorded]
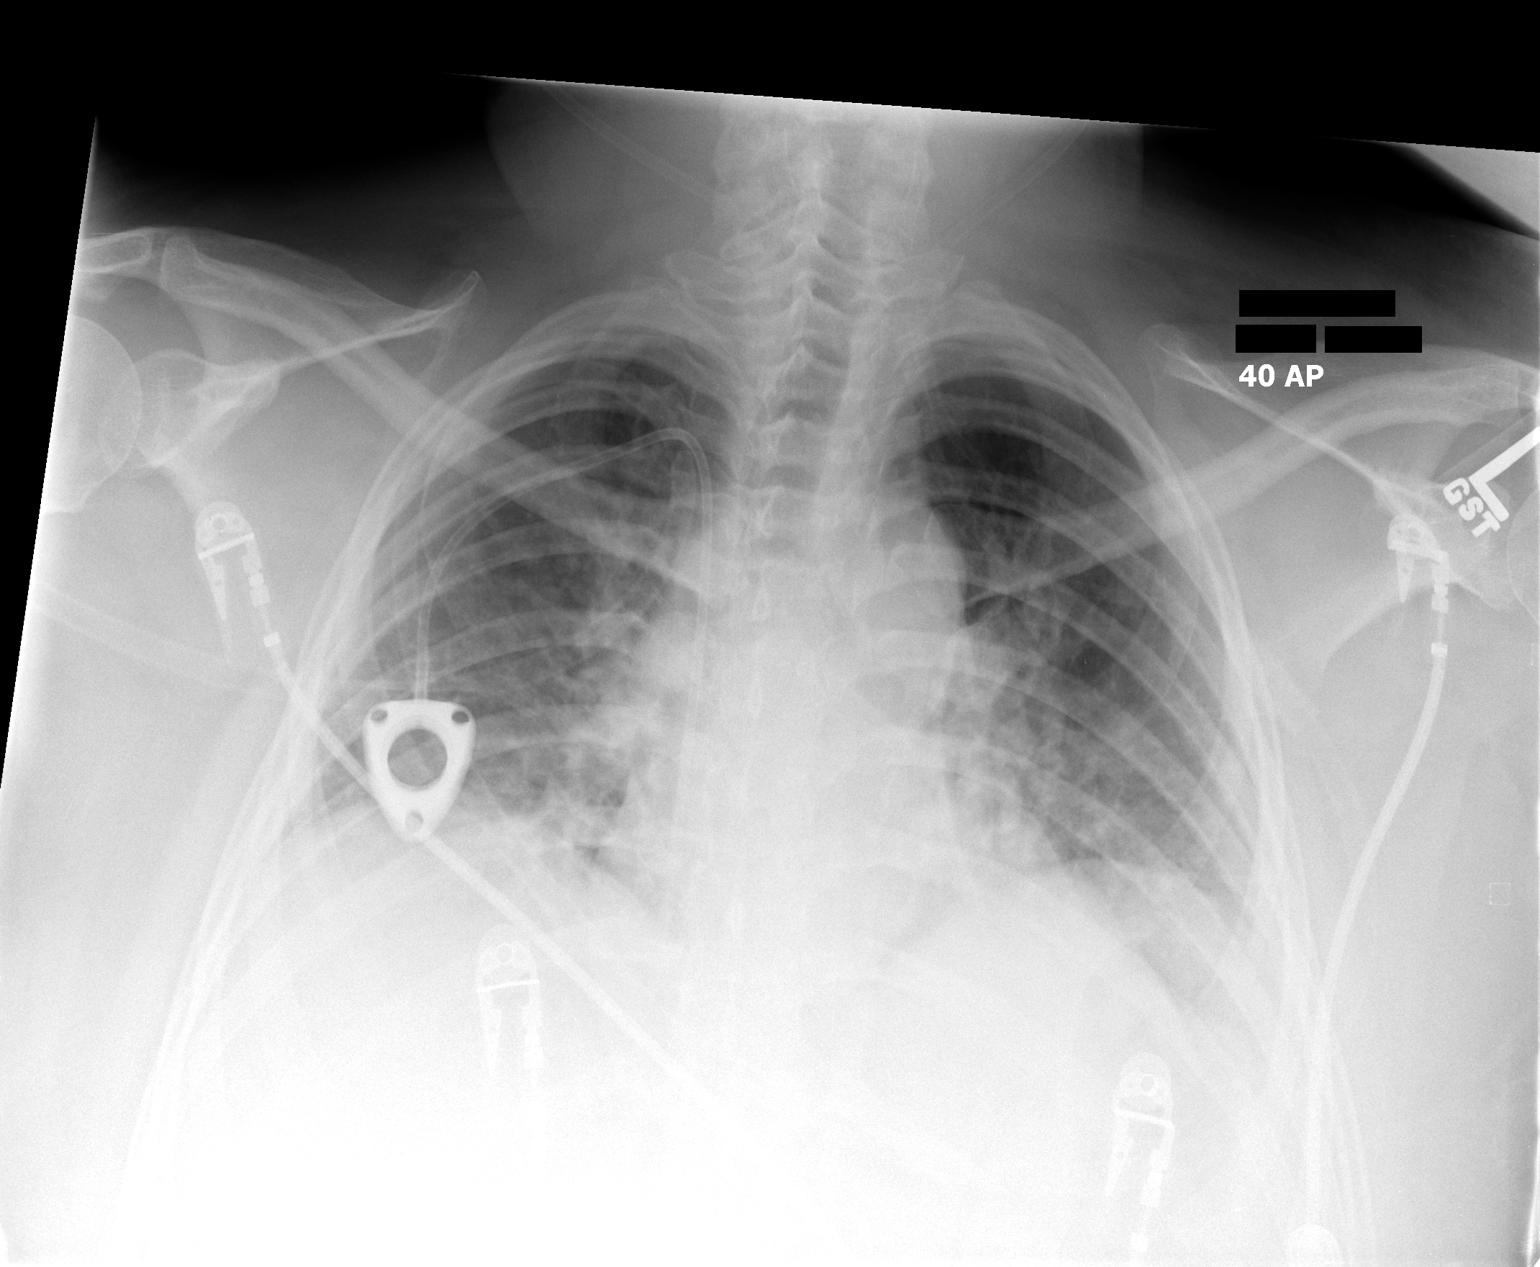

[1 of 1 positions shown; findings below may reference images not displayed]

FINDINGS: Lung volumes are low with bibasilar atelectatic change.
No effusion.  Heart size normal.
IMPRESSION: Low lung volumes with bibasilar atelectasis.

## 2009-06-02 ENCOUNTER — Emergency Department (HOSPITAL_COMMUNITY): Admission: EM | Admit: 2009-06-02 | Discharge: 2009-06-03 | Payer: Self-pay | Admitting: Emergency Medicine

## 2009-06-09 ENCOUNTER — Emergency Department (HOSPITAL_COMMUNITY): Admission: EM | Admit: 2009-06-09 | Discharge: 2009-06-10 | Payer: Self-pay | Admitting: Emergency Medicine

## 2009-06-11 ENCOUNTER — Emergency Department (HOSPITAL_COMMUNITY): Admission: EM | Admit: 2009-06-11 | Discharge: 2009-06-12 | Payer: Self-pay | Admitting: Emergency Medicine

## 2009-06-14 ENCOUNTER — Emergency Department (HOSPITAL_COMMUNITY): Admission: EM | Admit: 2009-06-14 | Discharge: 2009-06-15 | Payer: Self-pay | Admitting: Emergency Medicine

## 2009-08-03 ENCOUNTER — Emergency Department: Payer: Self-pay | Admitting: Emergency Medicine

## 2009-08-16 ENCOUNTER — Emergency Department (HOSPITAL_COMMUNITY): Admission: EM | Admit: 2009-08-16 | Discharge: 2009-08-17 | Payer: Self-pay | Admitting: Emergency Medicine

## 2009-08-17 ENCOUNTER — Emergency Department: Payer: Self-pay | Admitting: Emergency Medicine

## 2009-08-19 ENCOUNTER — Emergency Department (HOSPITAL_COMMUNITY): Admission: EM | Admit: 2009-08-19 | Discharge: 2009-08-19 | Payer: Self-pay | Admitting: Emergency Medicine

## 2009-09-11 ENCOUNTER — Ambulatory Visit (HOSPITAL_COMMUNITY): Admission: RE | Admit: 2009-09-11 | Discharge: 2009-09-11 | Payer: Self-pay | Admitting: Obstetrics and Gynecology

## 2009-09-22 ENCOUNTER — Inpatient Hospital Stay (HOSPITAL_COMMUNITY): Admission: EM | Admit: 2009-09-22 | Discharge: 2009-09-24 | Payer: Self-pay | Admitting: Emergency Medicine

## 2009-09-23 ENCOUNTER — Ambulatory Visit: Payer: Self-pay | Admitting: Cardiology

## 2009-09-23 ENCOUNTER — Encounter (INDEPENDENT_AMBULATORY_CARE_PROVIDER_SITE_OTHER): Payer: Self-pay | Admitting: Emergency Medicine

## 2009-09-23 ENCOUNTER — Encounter (INDEPENDENT_AMBULATORY_CARE_PROVIDER_SITE_OTHER): Payer: Self-pay | Admitting: Cardiology

## 2009-10-09 ENCOUNTER — Ambulatory Visit (HOSPITAL_BASED_OUTPATIENT_CLINIC_OR_DEPARTMENT_OTHER): Admission: RE | Admit: 2009-10-09 | Discharge: 2009-10-09 | Payer: Self-pay | Admitting: Surgery

## 2009-10-25 ENCOUNTER — Emergency Department (HOSPITAL_COMMUNITY): Admission: EM | Admit: 2009-10-25 | Discharge: 2009-10-26 | Payer: Self-pay | Admitting: Emergency Medicine

## 2009-10-28 ENCOUNTER — Emergency Department (HOSPITAL_COMMUNITY): Admission: EM | Admit: 2009-10-28 | Discharge: 2009-10-28 | Payer: Self-pay | Admitting: Family Medicine

## 2009-11-12 ENCOUNTER — Emergency Department (HOSPITAL_COMMUNITY): Admission: EM | Admit: 2009-11-12 | Discharge: 2009-11-12 | Payer: Self-pay | Admitting: Emergency Medicine

## 2010-01-14 ENCOUNTER — Emergency Department (HOSPITAL_COMMUNITY): Admission: EM | Admit: 2010-01-14 | Discharge: 2010-01-14 | Payer: Self-pay | Admitting: Emergency Medicine

## 2010-01-21 ENCOUNTER — Emergency Department (HOSPITAL_COMMUNITY): Admission: EM | Admit: 2010-01-21 | Discharge: 2010-01-22 | Payer: Self-pay | Admitting: Emergency Medicine

## 2010-01-31 ENCOUNTER — Emergency Department (HOSPITAL_COMMUNITY): Admission: EM | Admit: 2010-01-31 | Discharge: 2010-01-31 | Payer: Self-pay | Admitting: Emergency Medicine

## 2010-02-21 ENCOUNTER — Emergency Department (HOSPITAL_COMMUNITY): Admission: EM | Admit: 2010-02-21 | Discharge: 2010-02-21 | Payer: Self-pay | Admitting: Emergency Medicine

## 2010-02-27 ENCOUNTER — Emergency Department (HOSPITAL_COMMUNITY): Admission: EM | Admit: 2010-02-27 | Discharge: 2010-02-27 | Payer: Self-pay | Admitting: Emergency Medicine

## 2010-02-28 ENCOUNTER — Other Ambulatory Visit: Payer: Self-pay | Admitting: Emergency Medicine

## 2010-03-01 ENCOUNTER — Ambulatory Visit: Payer: Self-pay | Admitting: Psychiatry

## 2010-03-01 DIAGNOSIS — F39 Unspecified mood [affective] disorder: Secondary | ICD-10-CM

## 2010-03-02 ENCOUNTER — Inpatient Hospital Stay (HOSPITAL_COMMUNITY): Admission: AD | Admit: 2010-03-02 | Discharge: 2010-03-16 | Payer: Self-pay | Admitting: Psychiatry

## 2010-03-03 ENCOUNTER — Encounter: Payer: Self-pay | Admitting: Emergency Medicine

## 2010-03-04 ENCOUNTER — Encounter: Payer: Self-pay | Admitting: Emergency Medicine

## 2010-03-09 ENCOUNTER — Encounter: Payer: Self-pay | Admitting: Psychiatry

## 2010-03-26 ENCOUNTER — Emergency Department (HOSPITAL_COMMUNITY): Admission: EM | Admit: 2010-03-26 | Discharge: 2009-06-02 | Payer: Self-pay | Admitting: Emergency Medicine

## 2010-03-30 IMAGING — CR DG CHEST 2V
2 series · 2 of 2 positions shown · non-contrast
Comparison: 02/29/2008

CLINICAL DATA: Weakness for about

CHEST - 2 VIEW

[w chest pa]
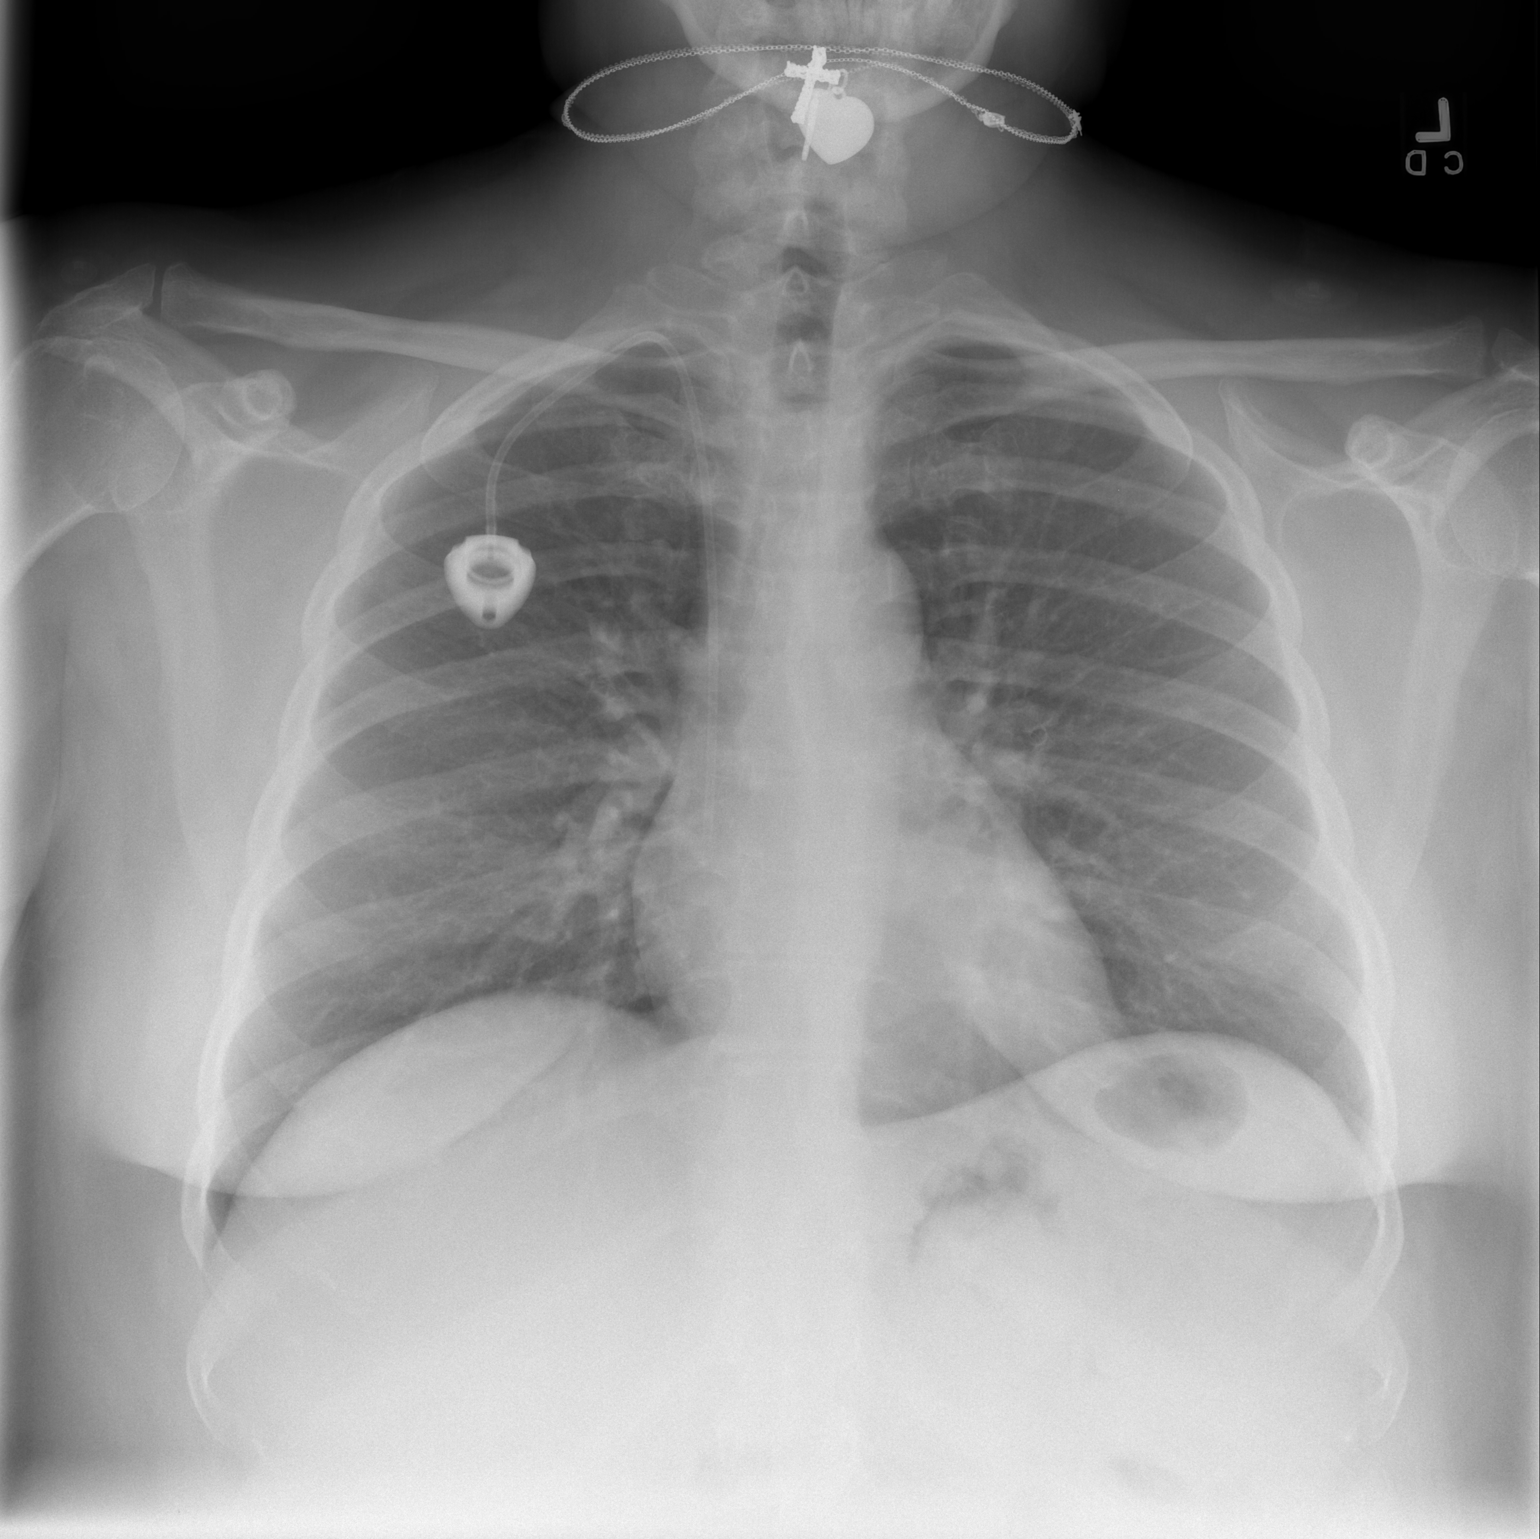

[w chest lat]
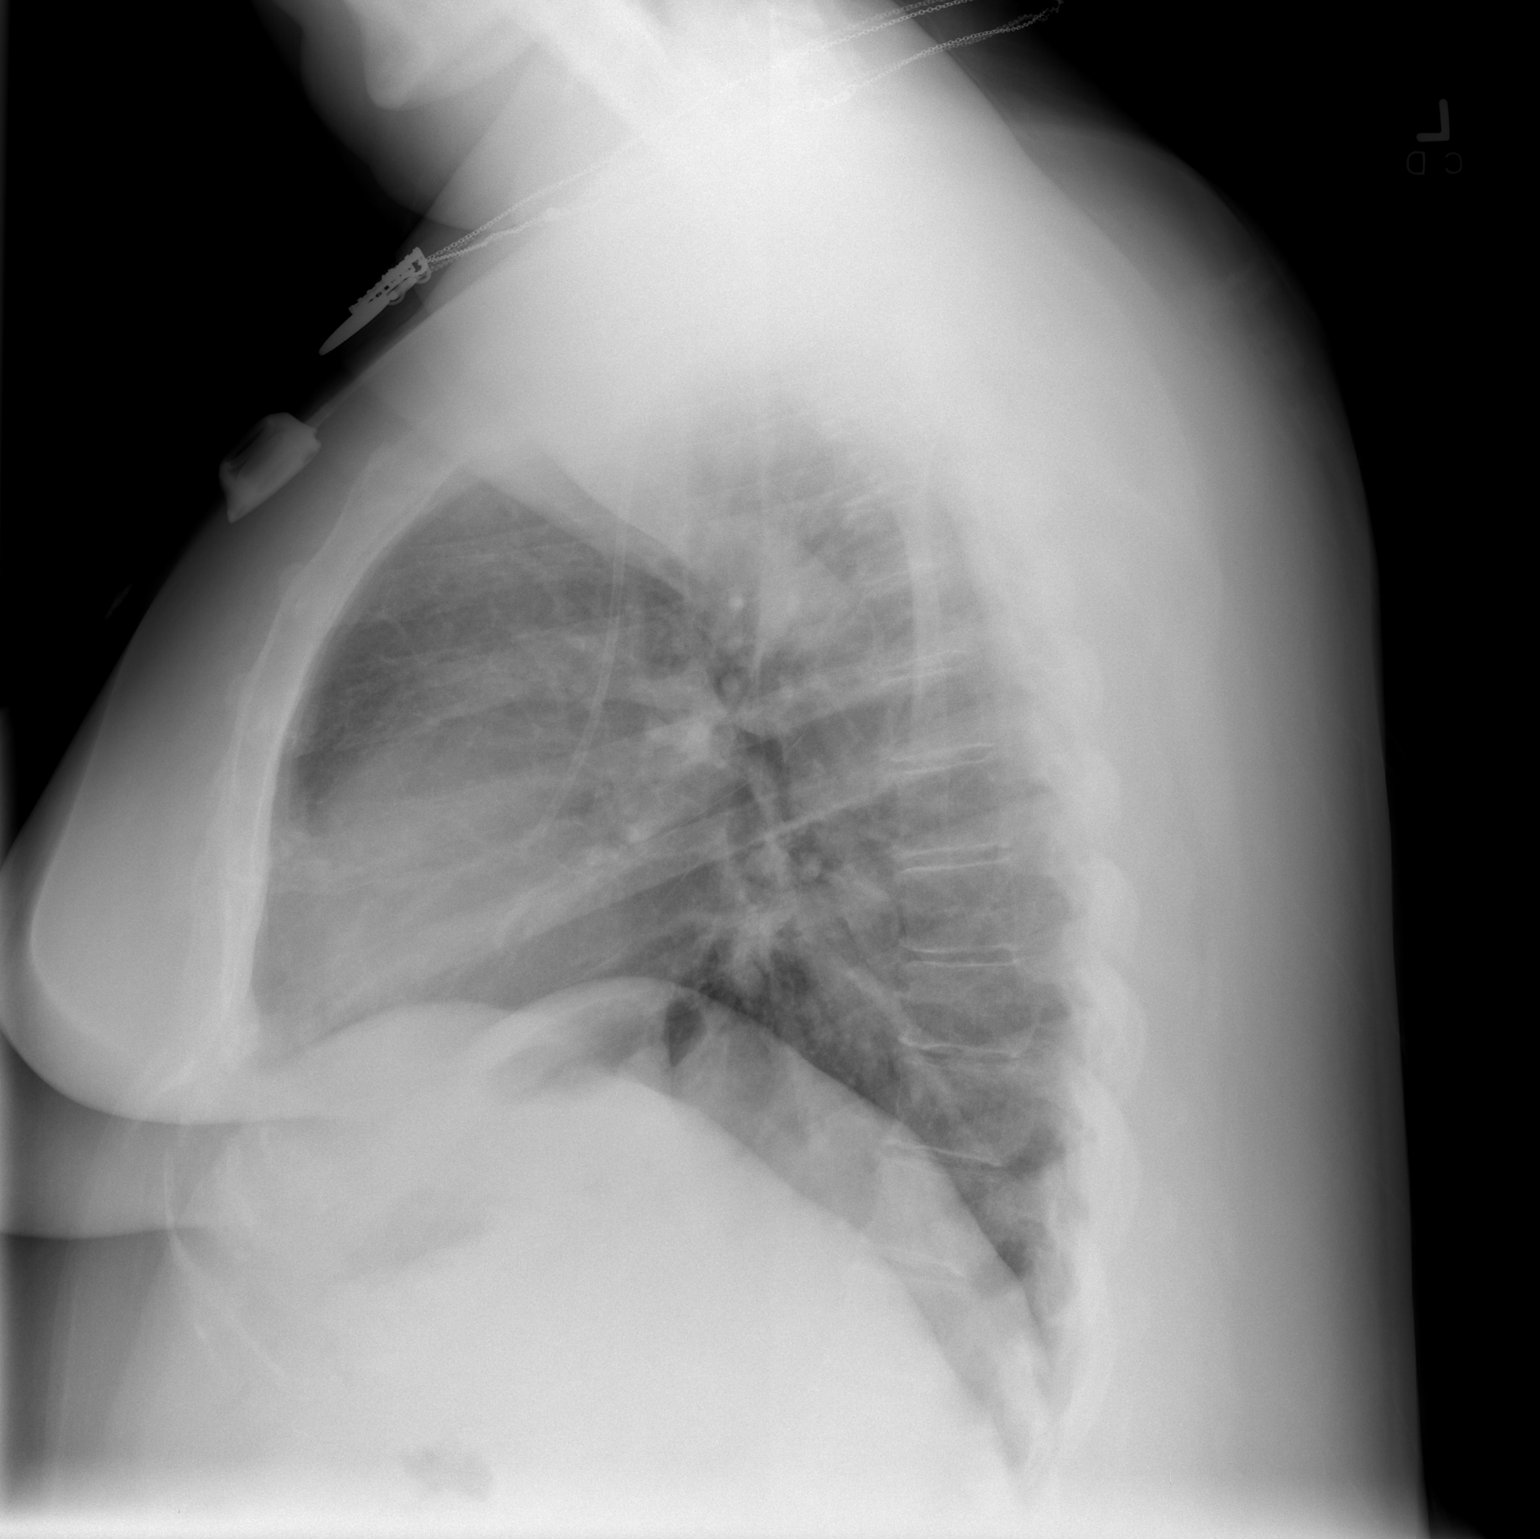

[2 of 2 positions shown; findings below may reference images not displayed]

FINDINGS: Right-sided Port-A-Cath tip:  Lower SVC.

Cardiac and mediastinal contours appear unremarkable.

The lungs appear clear.  No pleural effusion identified.
IMPRESSION: 1.  No significant thoracic abnormality identified.
2.  Right-sided Port-A-Cath tip:  Lower SVC.

## 2010-04-02 IMAGING — CR RIGHT HAND - COMPLETE 3+ VIEW
1 series · 3 of 3 positions shown · non-contrast
Comparison: none

REASON FOR EXAM: swelling to 1st thru 3rd digit , pain
COMMENTS:

PROCEDURE:     DXR - DXR HAND RT COMPLETE W/OBLIQUES  - January 02, 2009  [DATE]
RESULT:     Three views of the right hand reveal the bones to be reasonably
well mineralized. I do not see evidence of an acute fracture. The joint
spaces are preserved. The overlying soft tissues appear normal.

[Series 1: view not recorded · 0.17mm/px · 3 of 3 slices shown]
[im 1/3]
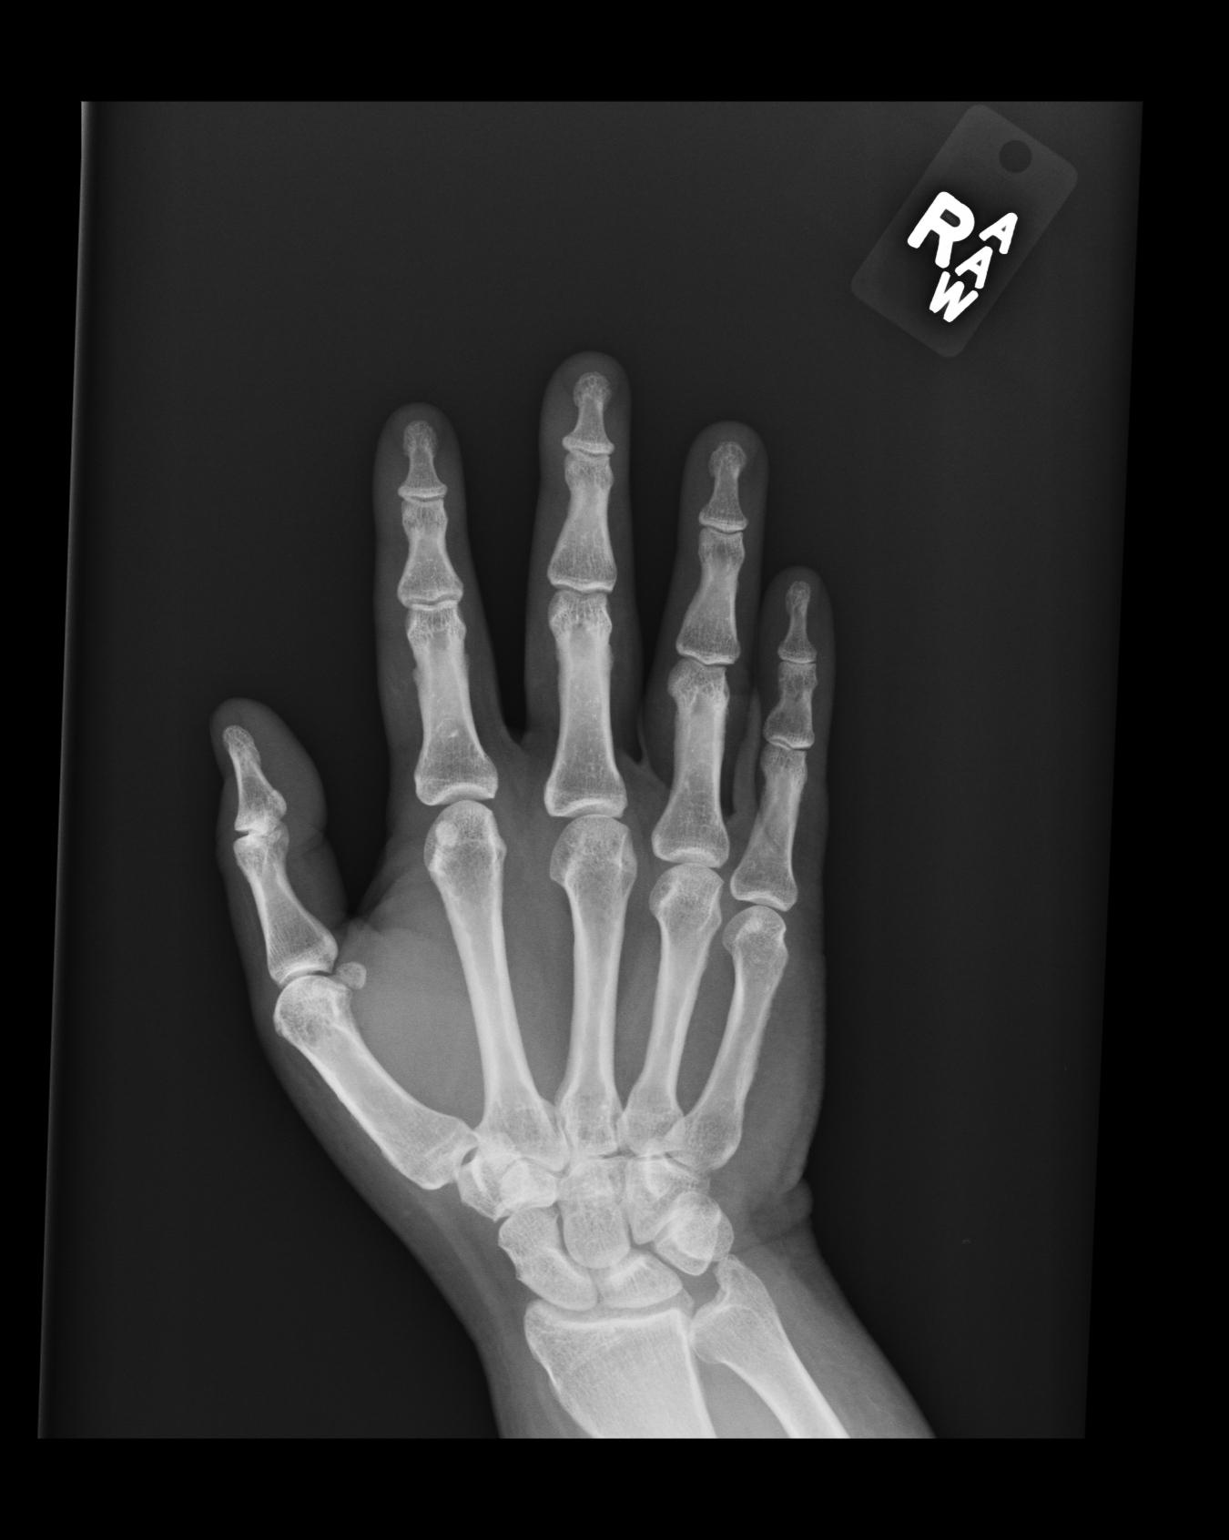
[im 2/3]
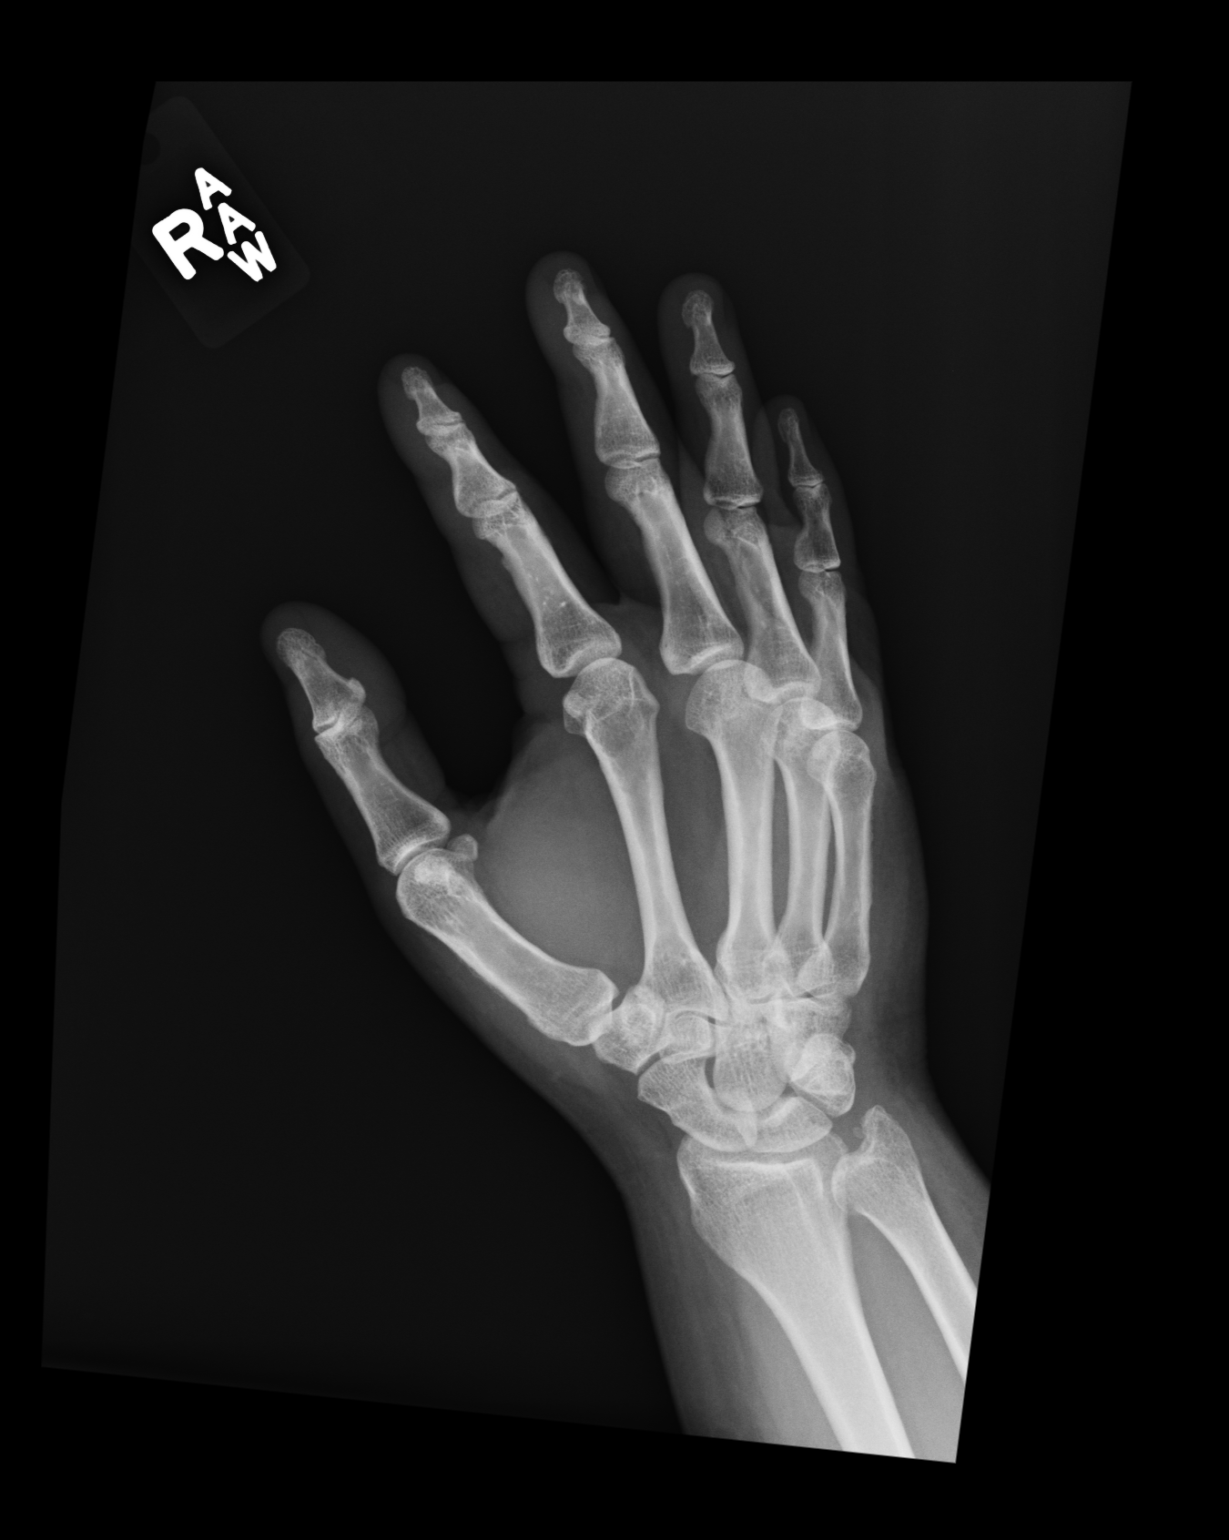
[im 3/3]
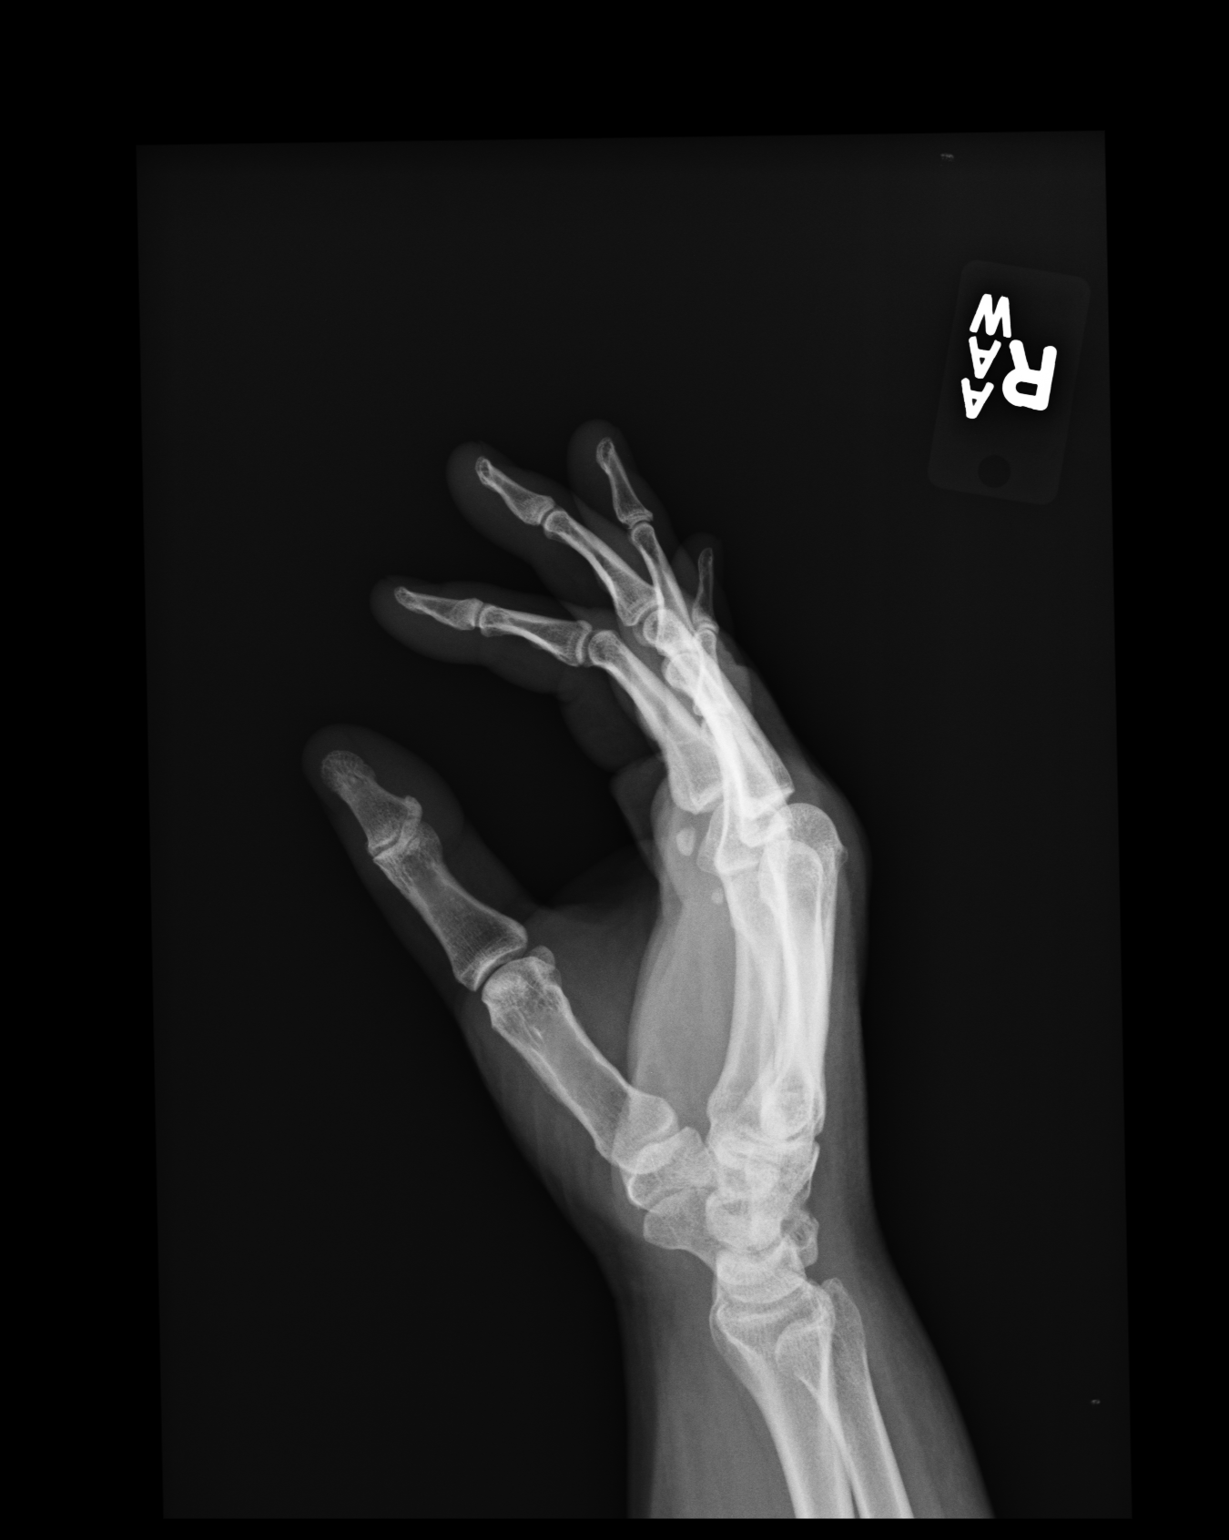

[3 of 3 positions shown; findings below may reference images not displayed]

IMPRESSION: I do not see acute bony abnormality of the right hand.
Followup imaging is available if the patient's symptoms persist and remain
unexplained.

## 2010-04-30 ENCOUNTER — Encounter
Admission: RE | Admit: 2010-04-30 | Discharge: 2010-05-19 | Payer: Self-pay | Source: Home / Self Care | Attending: Orthopedic Surgery | Admitting: Orthopedic Surgery

## 2010-05-05 ENCOUNTER — Encounter
Admission: RE | Admit: 2010-05-05 | Discharge: 2010-05-05 | Payer: Self-pay | Source: Home / Self Care | Attending: Orthopaedic Surgery | Admitting: Orthopaedic Surgery

## 2010-05-05 ENCOUNTER — Encounter: Admission: RE | Admit: 2010-05-05 | Payer: Self-pay | Source: Home / Self Care | Admitting: Orthopaedic Surgery

## 2010-05-10 ENCOUNTER — Encounter: Payer: Self-pay | Admitting: Orthopaedic Surgery

## 2010-05-24 IMAGING — CR DG HAND COMPLETE 3+V*R*
3 series · 3 of 3 positions shown · non-contrast
Comparison: None available.

CLINICAL DATA: Injury, pain.

RIGHT HAND - COMPLETE 3+ VIEW

[x hand ap right *]
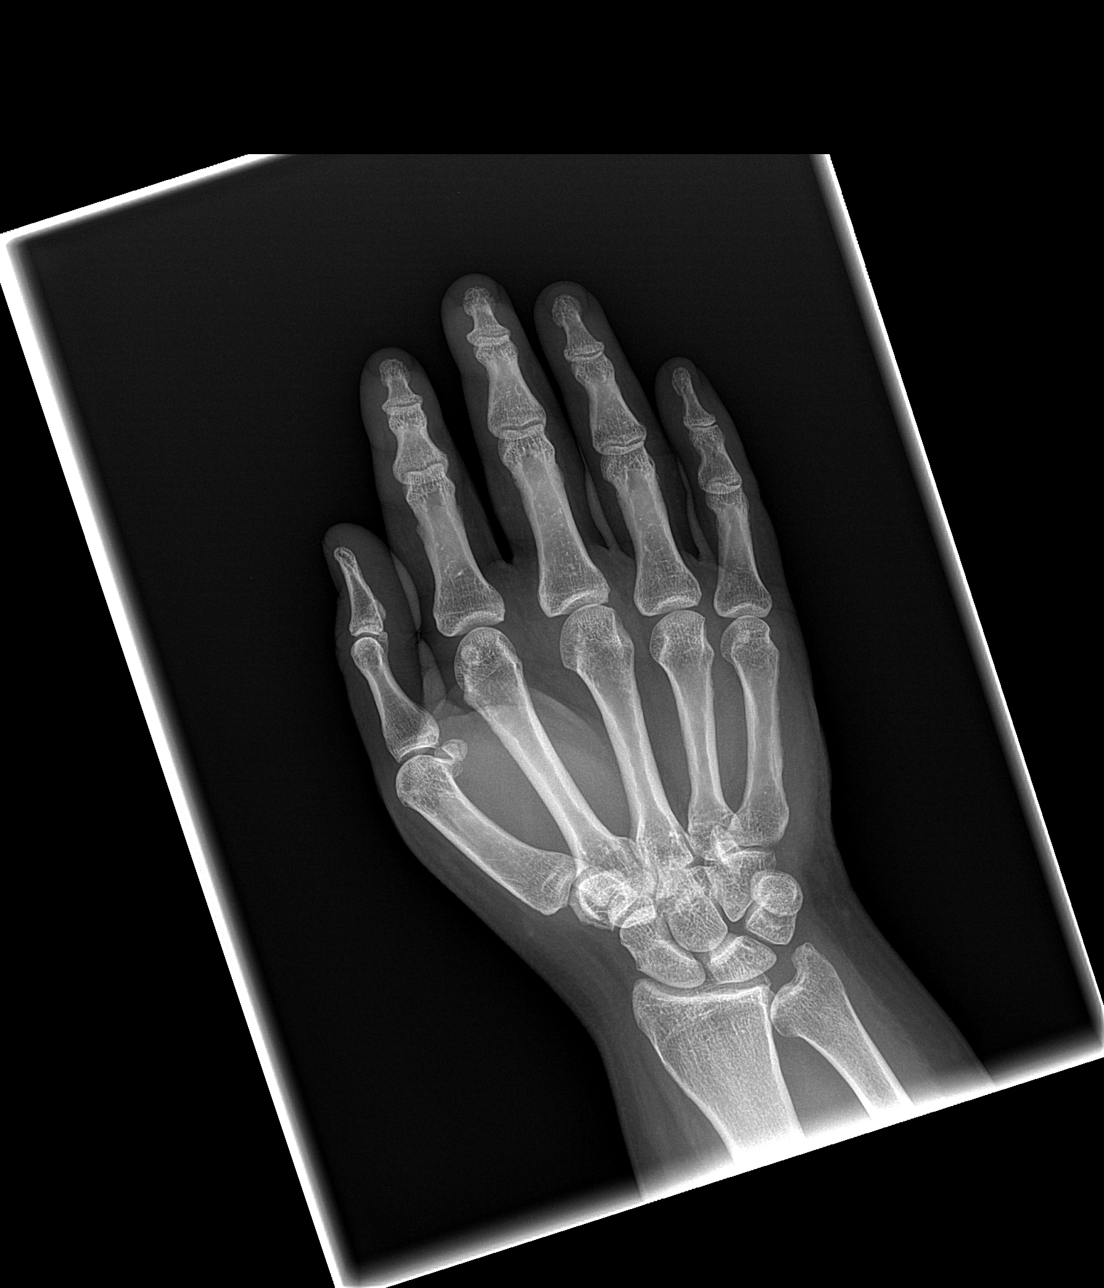

[x hand oblique right]
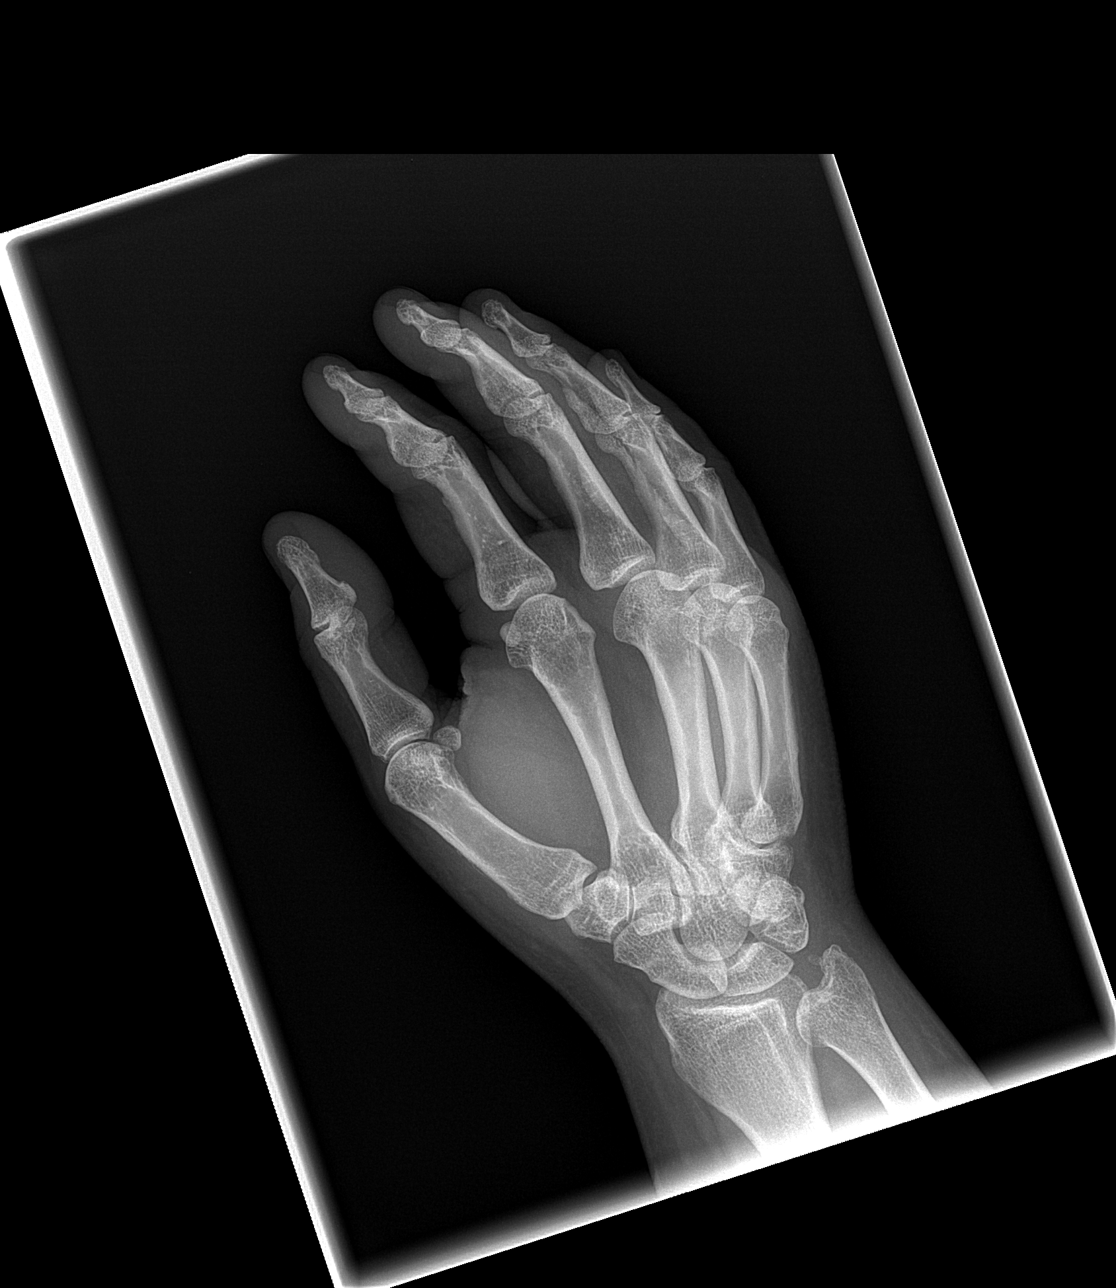

[x hand lat right *]
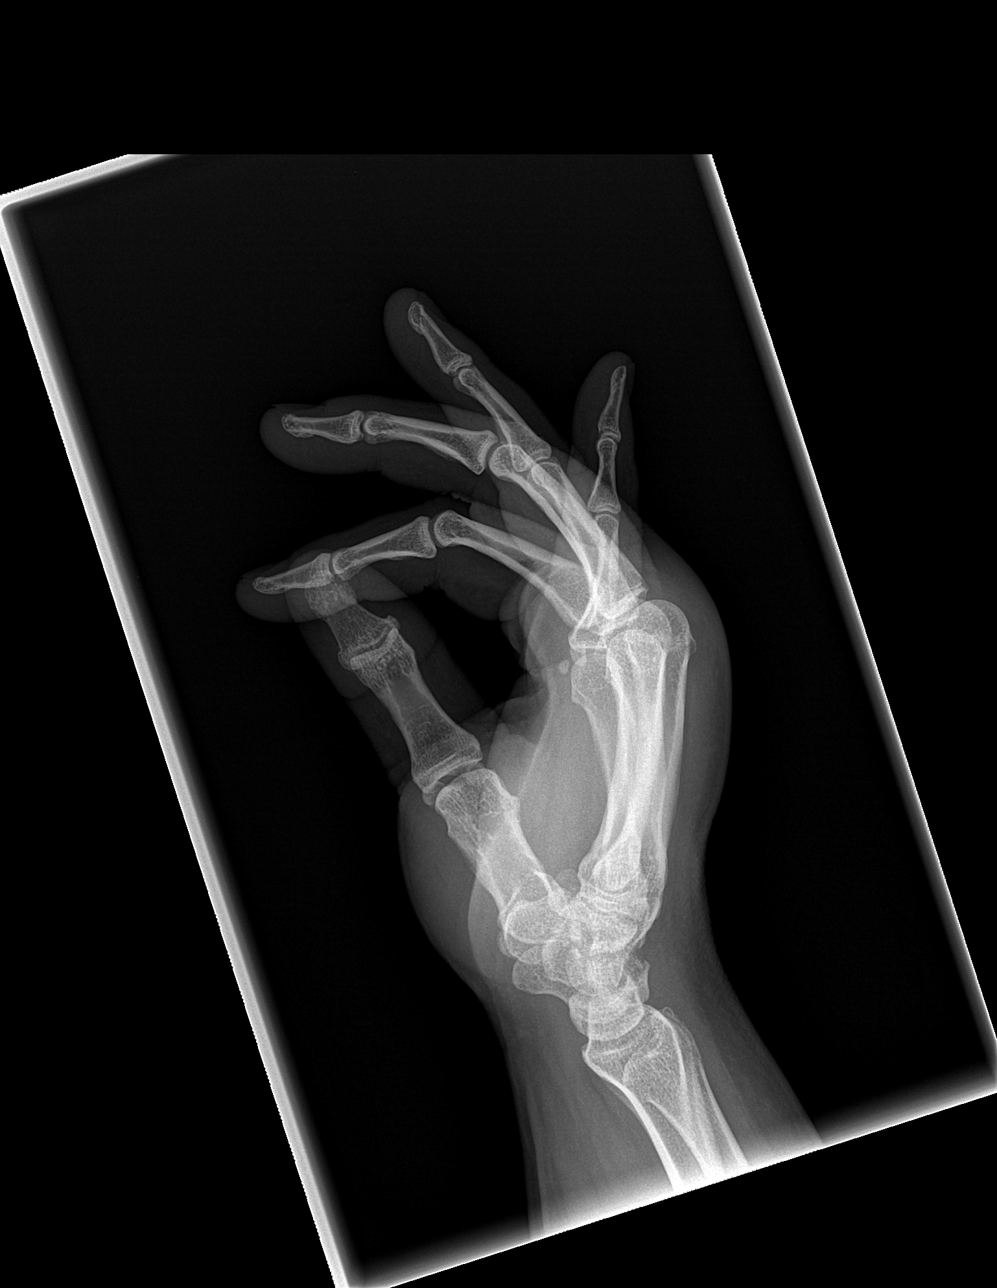

[3 of 3 positions shown; findings below may reference images not displayed]

FINDINGS: There is soft tissue swelling about the dorsum of the
hand.  No underlying fracture, dislocation or radiopaque foreign
body.
IMPRESSION: Soft tissue swelling without underlying acute bony or joint
abnormality.

## 2010-05-26 ENCOUNTER — Encounter: Payer: Self-pay | Admitting: Physical Therapy

## 2010-05-28 ENCOUNTER — Ambulatory Visit: Payer: Medicare Other | Attending: Orthopedic Surgery

## 2010-05-28 DIAGNOSIS — M25579 Pain in unspecified ankle and joints of unspecified foot: Secondary | ICD-10-CM | POA: Insufficient documentation

## 2010-05-28 DIAGNOSIS — M25673 Stiffness of unspecified ankle, not elsewhere classified: Secondary | ICD-10-CM | POA: Insufficient documentation

## 2010-05-28 DIAGNOSIS — M6281 Muscle weakness (generalized): Secondary | ICD-10-CM | POA: Insufficient documentation

## 2010-05-28 DIAGNOSIS — M25676 Stiffness of unspecified foot, not elsewhere classified: Secondary | ICD-10-CM | POA: Insufficient documentation

## 2010-05-28 DIAGNOSIS — R5381 Other malaise: Secondary | ICD-10-CM | POA: Insufficient documentation

## 2010-05-28 DIAGNOSIS — IMO0001 Reserved for inherently not codable concepts without codable children: Secondary | ICD-10-CM | POA: Insufficient documentation

## 2010-06-02 ENCOUNTER — Ambulatory Visit: Payer: Medicare Other

## 2010-06-02 IMAGING — MR MR CERVICAL SPINE W/O CM
5 of 7 series · 18 of 48 positions shown · non-contrast
Comparison: None available.

CLINICAL DATA: Neck and bilateral arm pain.

MRI CERVICAL SPINE WITHOUT CONTRAST
TECHNIQUE: Multiplanar and multiecho pulse sequences of the
cervical spine, to include the craniocervical junction and
cervicothoracic junction, were obtained according to standard
protocol without intravenous contrast.

[Series 3: T2 · sagittal · 3.5mm · 0.33mm/px · 3 of 11 slices shown (1 of 2)]
[im 1/11]
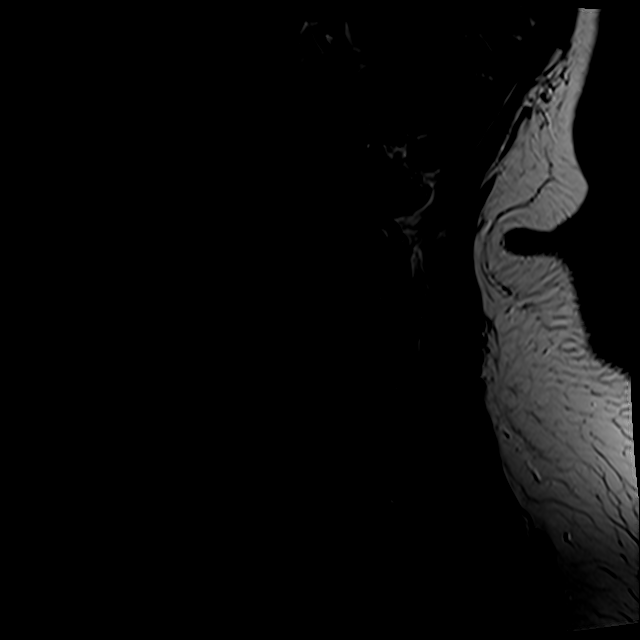
[im 6/11]
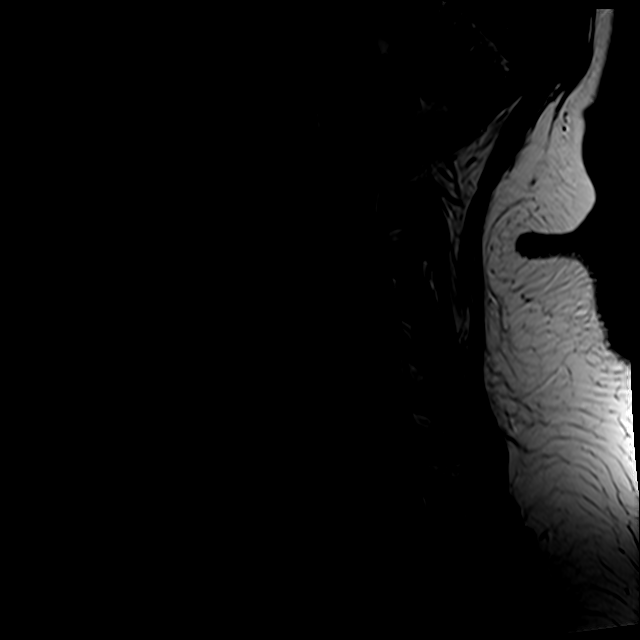
[im 11/11]
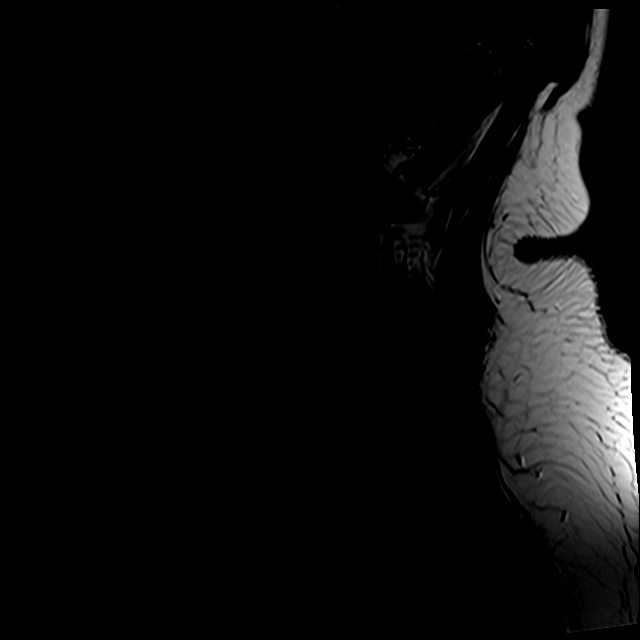

[Series 4: T1 · sagittal · 3.5mm · 0.41mm/px · 3 of 11 slices shown]
[im 1/11]
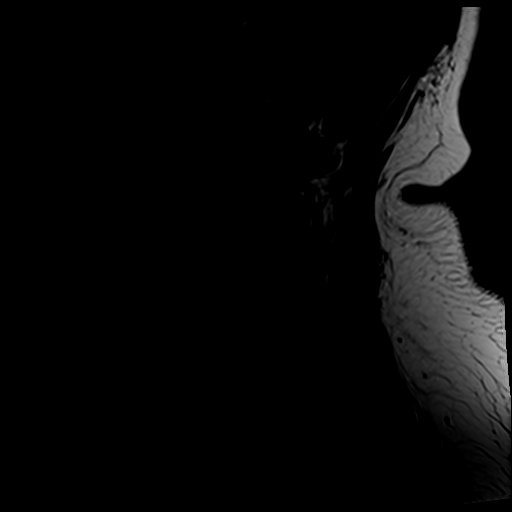
[im 6/11]
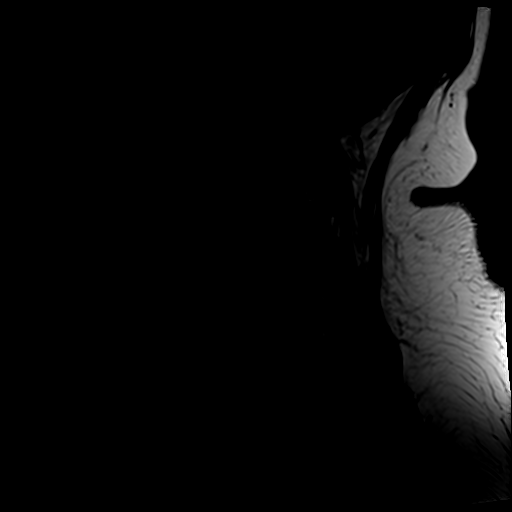
[im 11/11]
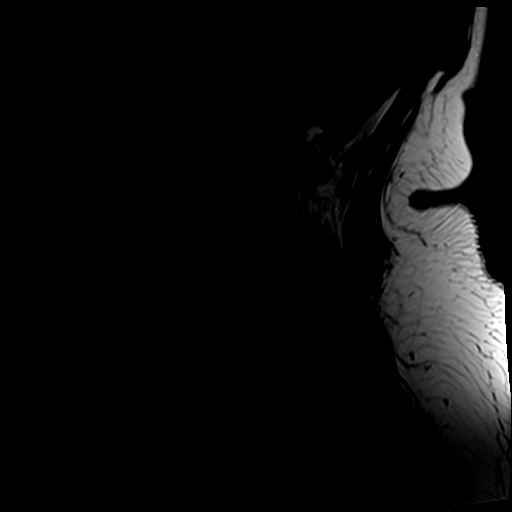

[Series 5: PD · sagittal · 3.5mm · 0.41mm/px · 3 of 11 slices shown]
[im 1/11]
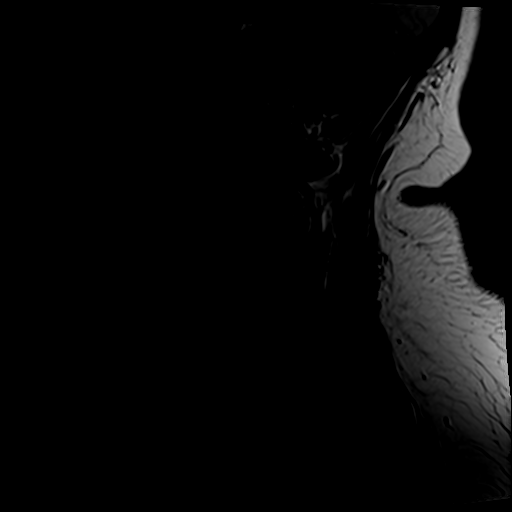
[im 6/11]
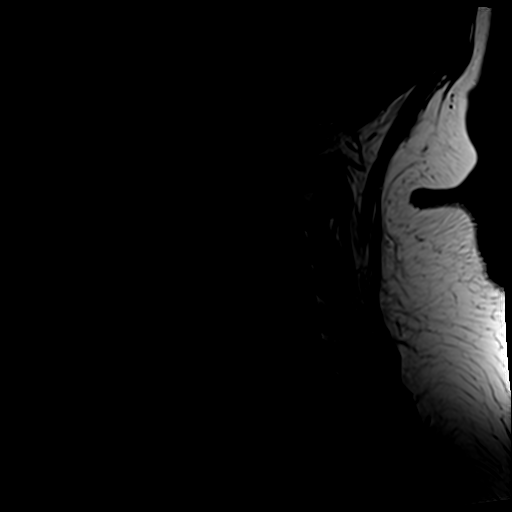
[im 11/11]
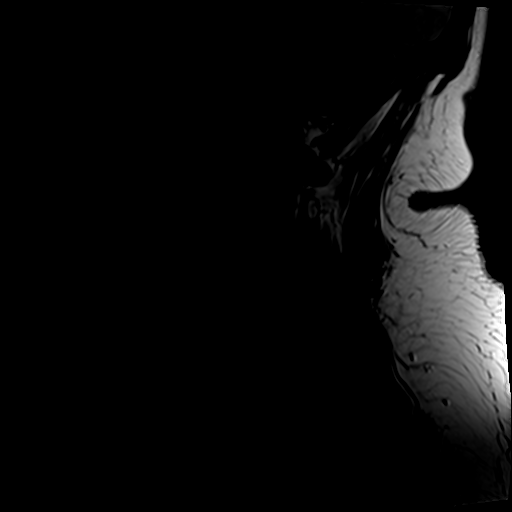

[Series 6: tir sag · sagittal · 3.5mm · 0.43mm/px · 3 of 11 slices shown]
[im 1/11]
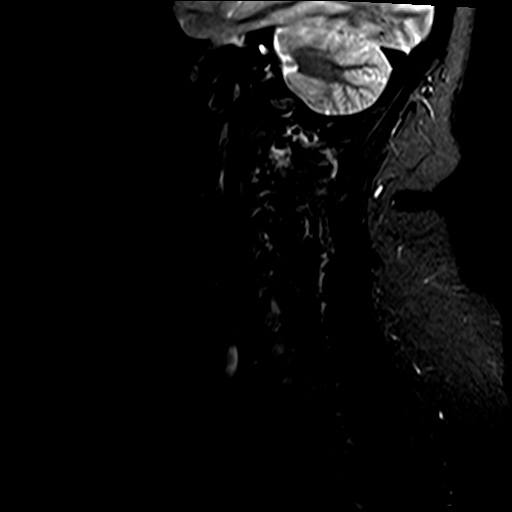
[im 6/11]
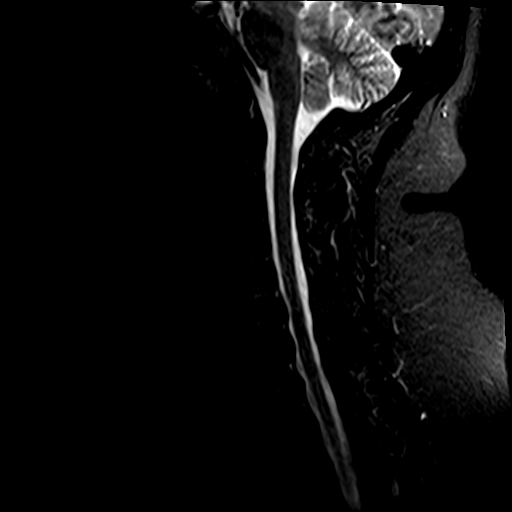
[im 11/11]
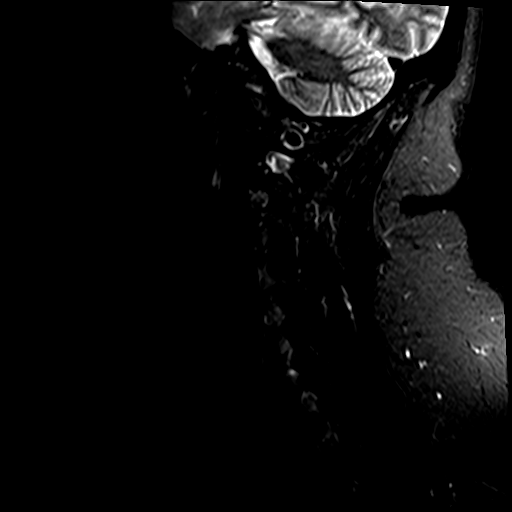

[Series 9: T2 · axial · 4.0mm · 0.41mm/px · z∈[-114,-21]mm · 6 of 21 slices shown (2 of 2)]
[im 1/21]
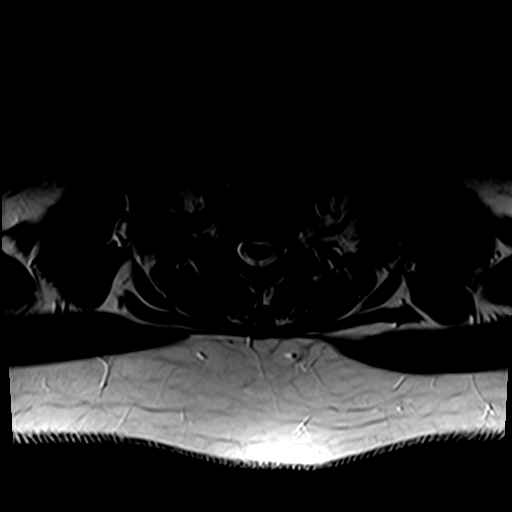
[im 5/21]
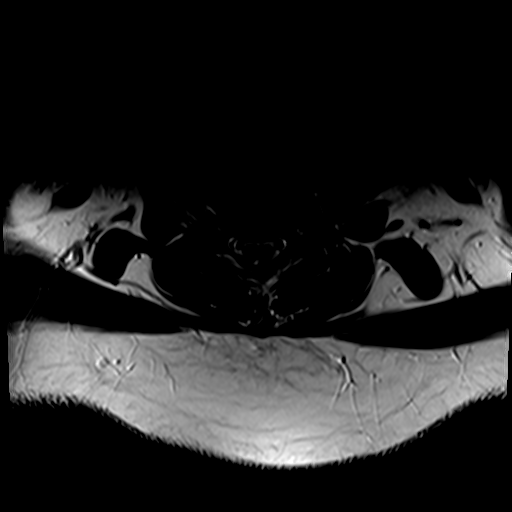
[im 9/21]
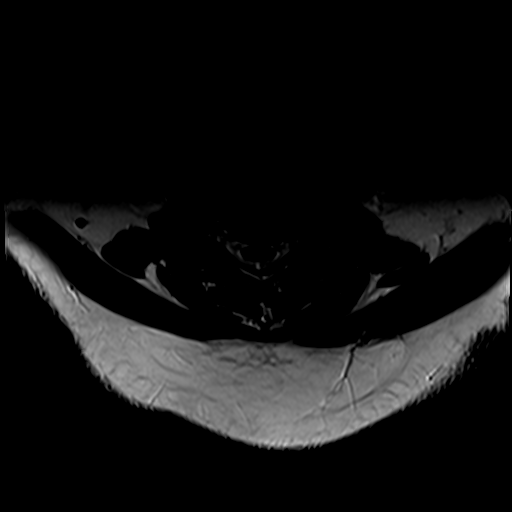
[im 13/21]
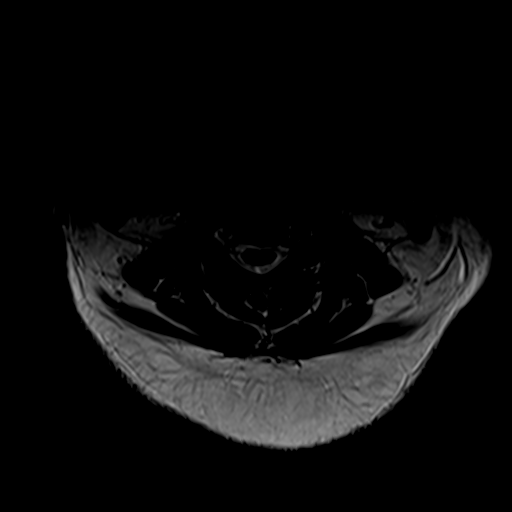
[im 17/21]
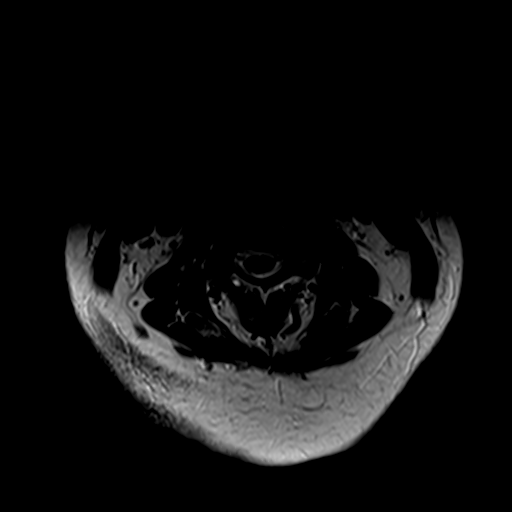
[im 21/21]
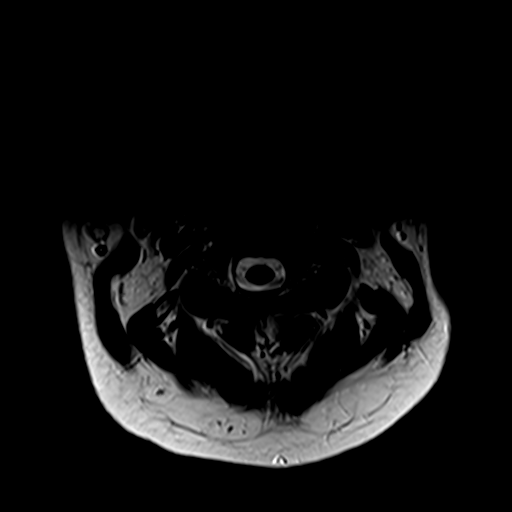

[18 of 48 positions shown; findings below may reference images not displayed]

FINDINGS: There is straightening of the normal cervical lordosis
but vertebral body height and alignment are maintained.  Discogenic
marrow signal change is noted at C5-6.  No worrisome marrow lesion.
The craniocervical junction is normal and cervical cord signal is
normal.

C2-3:  Negative.

C3-4:  Negative.

C4-5:  Negative.

C5-6:  There is a mild disc bulge narrowing but not quite effacing
the ventral thecal sac.  Foramina are widely patent.

C6-7:  Mild disc bulge narrows the ventral thecal sac.  Foramina
widely patent.

C7-T1:  Mild disc bulge without central canal or foraminal
stenosis.
IMPRESSION: Overall mild spondylosis of the cervical spine most notable C5-6
and C6-7 disc bulges narrow but do not effaced the ventral thecal
sac.

## 2010-06-02 IMAGING — MR MR LUMBAR SPINE W/O CM
4 of 6 series · 19 of 48 positions shown · non-contrast
Comparison: Plain films lumbar spine 11/04/2006.

CLINICAL DATA: History of prior lumbar surgery.  Low back and
bilateral leg pain and numbness.

MRI LUMBAR SPINE WITHOUT CONTRAST
TECHNIQUE: Multiplanar and multiecho pulse sequences of the lumbar
spine were obtained without intravenous contrast.

[Series 3: T2 · sagittal · 4.0mm · 0.55mm/px · 5 of 11 slices shown (1 of 2)]
[im 1/11]
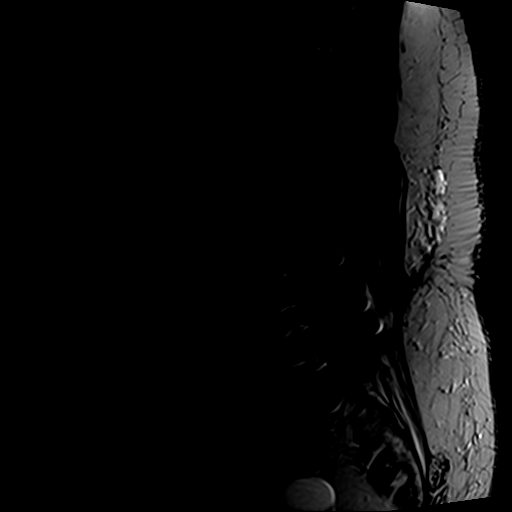
[im 3/11]
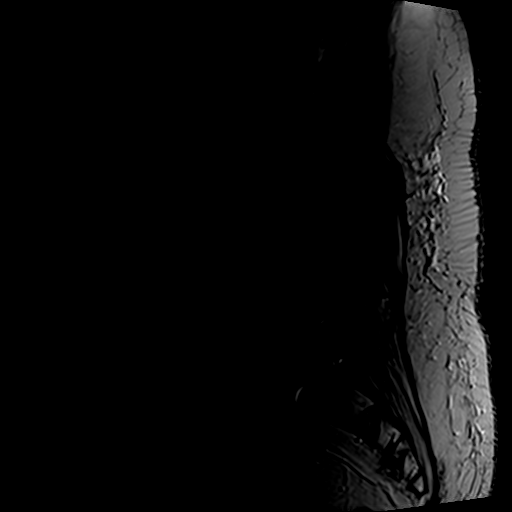
[im 6/11]
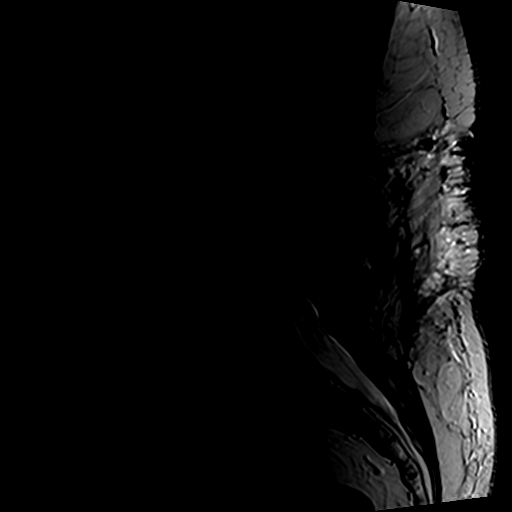
[im 8/11]
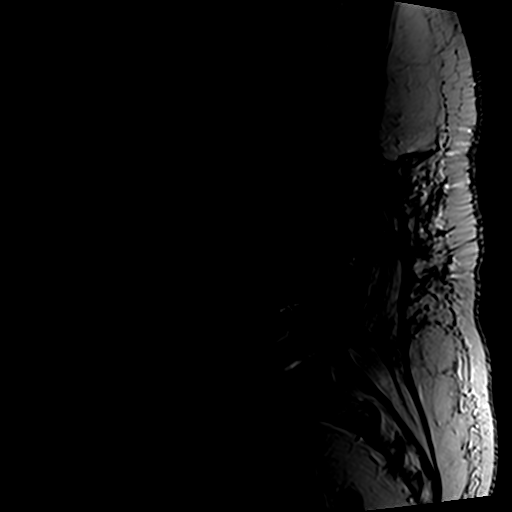
[im 11/11]
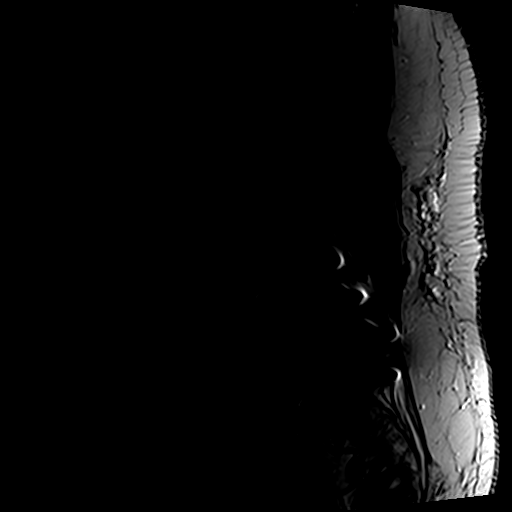

[Series 4: T1 · sagittal · 4.0mm · 0.55mm/px · 3 of 11 slices shown (1 of 2)]
[im 1/11]
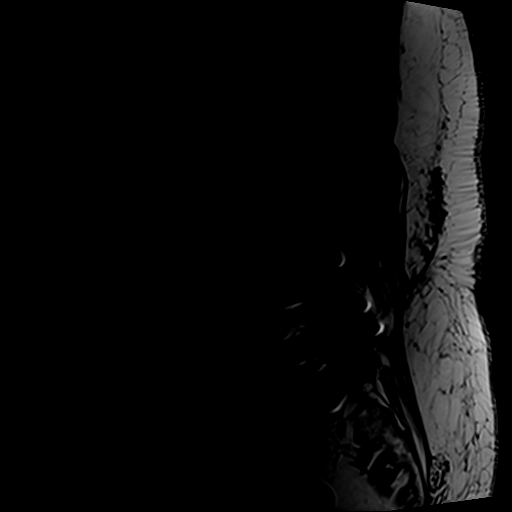
[im 6/11]
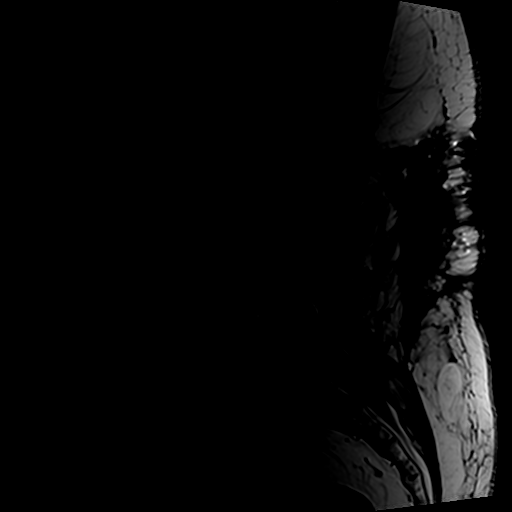
[im 11/11]
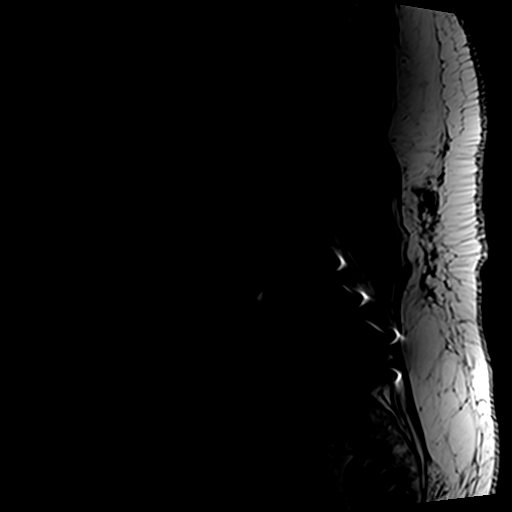

[Series 7: T1 · axial · 4.0mm · 0.47mm/px · z∈[-83,+108]mm · 3 of 39 slices shown (2 of 2)]
[im 6/39]
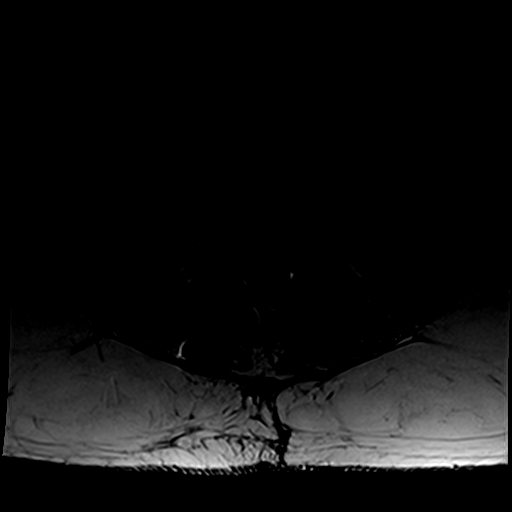
[im 20/39]
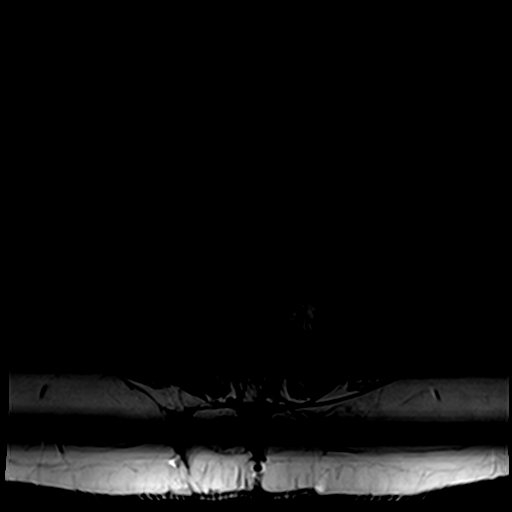
[im 33/39]
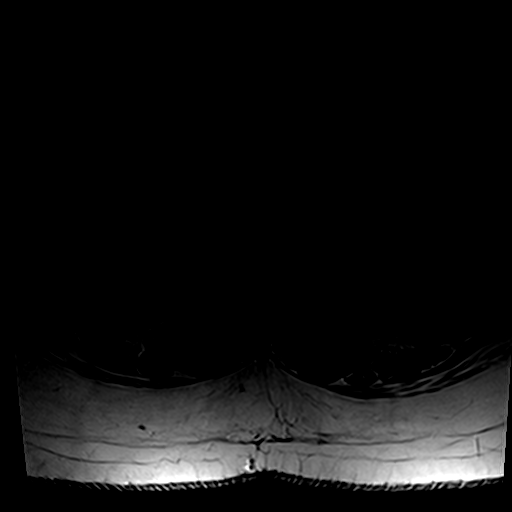

[Series 8: T2 · axial · 4.0mm · 0.47mm/px · z∈[-107,+108]mm · 8 of 39 slices shown (2 of 2)]
[im 1/39]
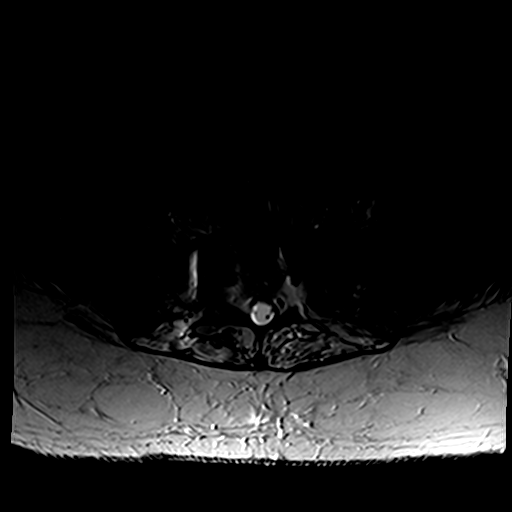
[im 6/39]
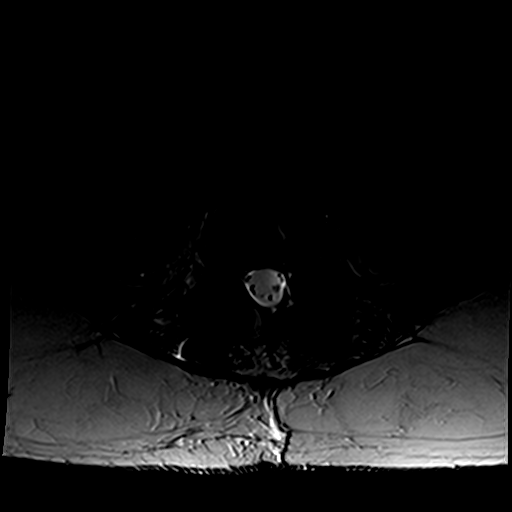
[im 11/39]
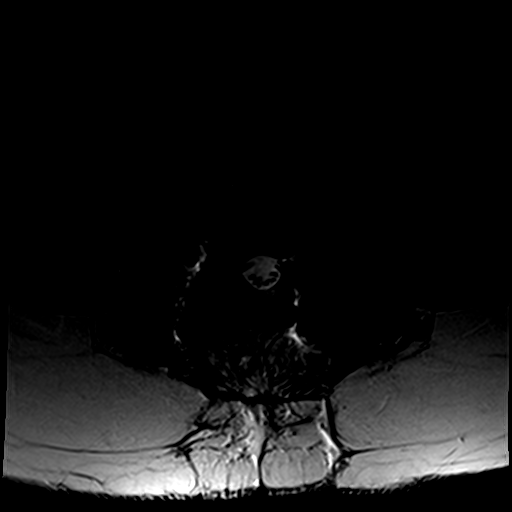
[im 17/39]
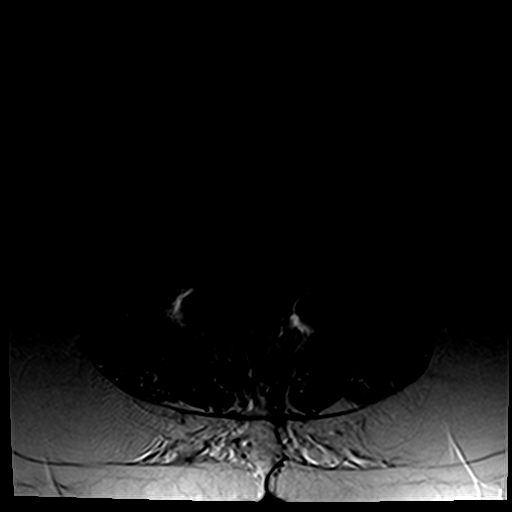
[im 20/39]
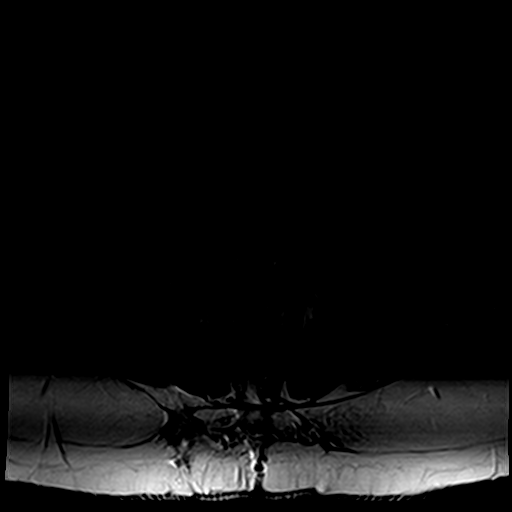
[im 22/39]
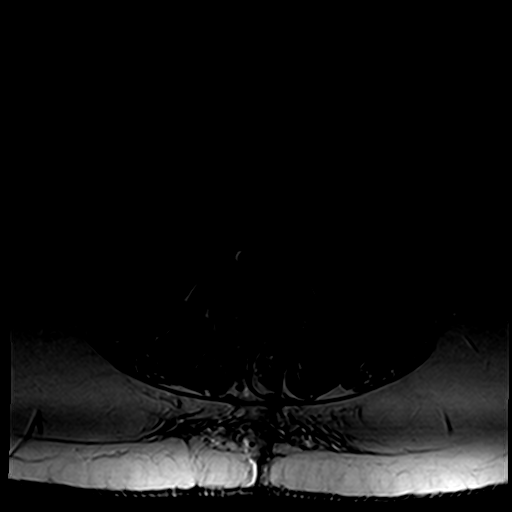
[im 28/39]
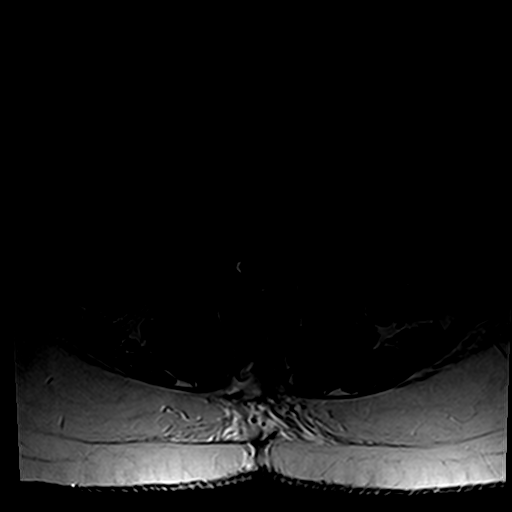
[im 33/39]
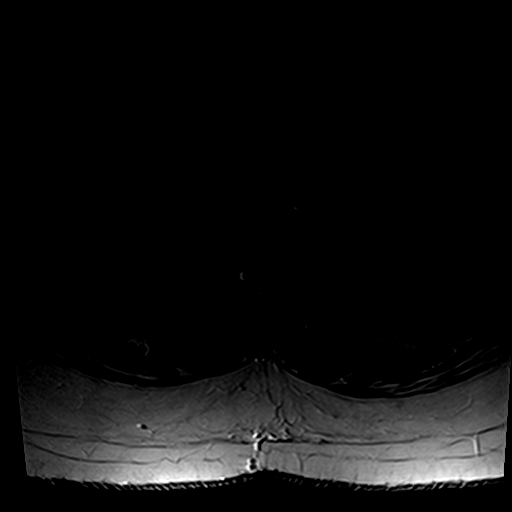

[19 of 48 positions shown; findings below may reference images not displayed]

FINDINGS: The patient is status post L4-S1 PLIF.  The levels appear
solidly fused.  Vertebral body height and alignment are maintained.
No worrisome marrow lesion. There is partial visualization of
cm left adnexal cystic lesion. Imaged intra-abdominal contents are
otherwise unremarkable.

T11-12:  Negative.

T12-L1:  Minimal disc bulge without central canal or foraminal
narrowing.

L1-2:  Negative.

L2-3:  Mild disc bulge and facet arthropathy.  Central canal and
foramina open.

L4-5:  Mild broad-based disc bulge is present which slightly
flattens the ventral thecal sac.  Central canal is only mildly
narrowed.  Mild bilateral foraminal narrowing also seen.

L4-5:  Status post PLIF.  Central canal and foramina widely patent.

L5-S1:  Status post PLIF.  Central canal and foramina widely
patent.
IMPRESSION: 1.  Status post L4-S1 PLIF with wide decompression of the thecal
sac and patent foramina.
2.  Mild disc bulge at L3-4 causes only mild central canal
narrowing.
3.  3.0 cm cystic lesion is partially visualized in the left hemi
pelvis.  Recommend follow-up pelvic ultrasound in 4 - 6 weeks.

## 2010-06-04 ENCOUNTER — Ambulatory Visit: Payer: Medicare Other

## 2010-06-14 IMAGING — CR DG HAND COMPLETE 3+V*R*
3 series · 3 of 3 positions shown · non-contrast
Comparison: [DATE] 11/05/2008

CLINICAL DATA: Bruising post fall

RIGHT HAND - COMPLETE 3+ VIEW

[x hand ap right]
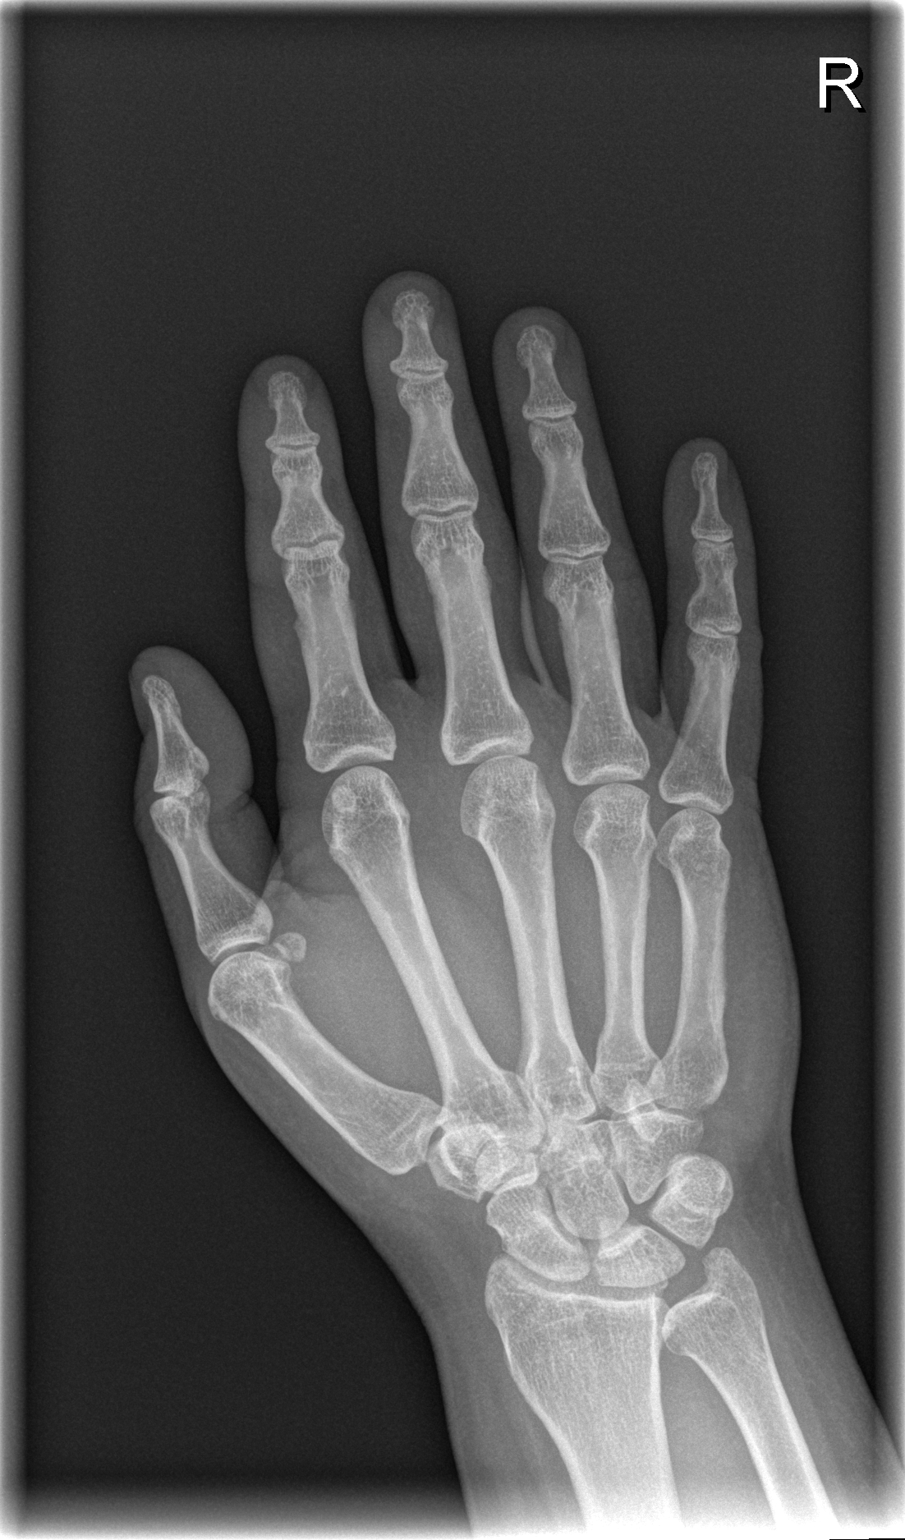

[x hand oblique right]
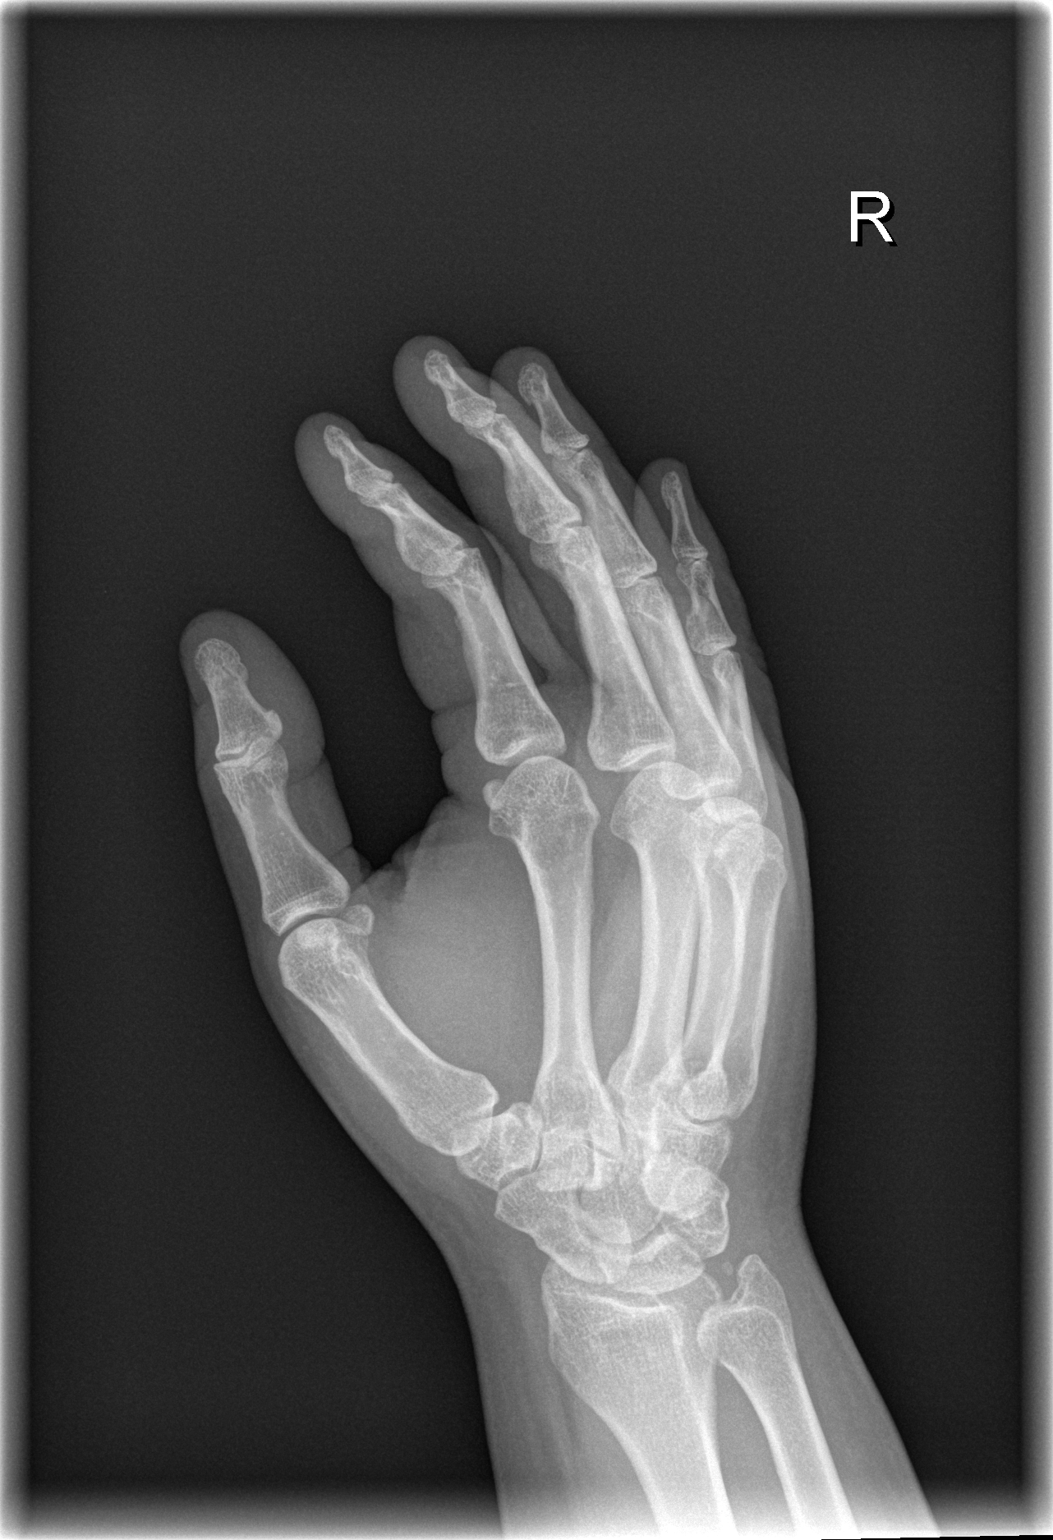

[x hand lat right]
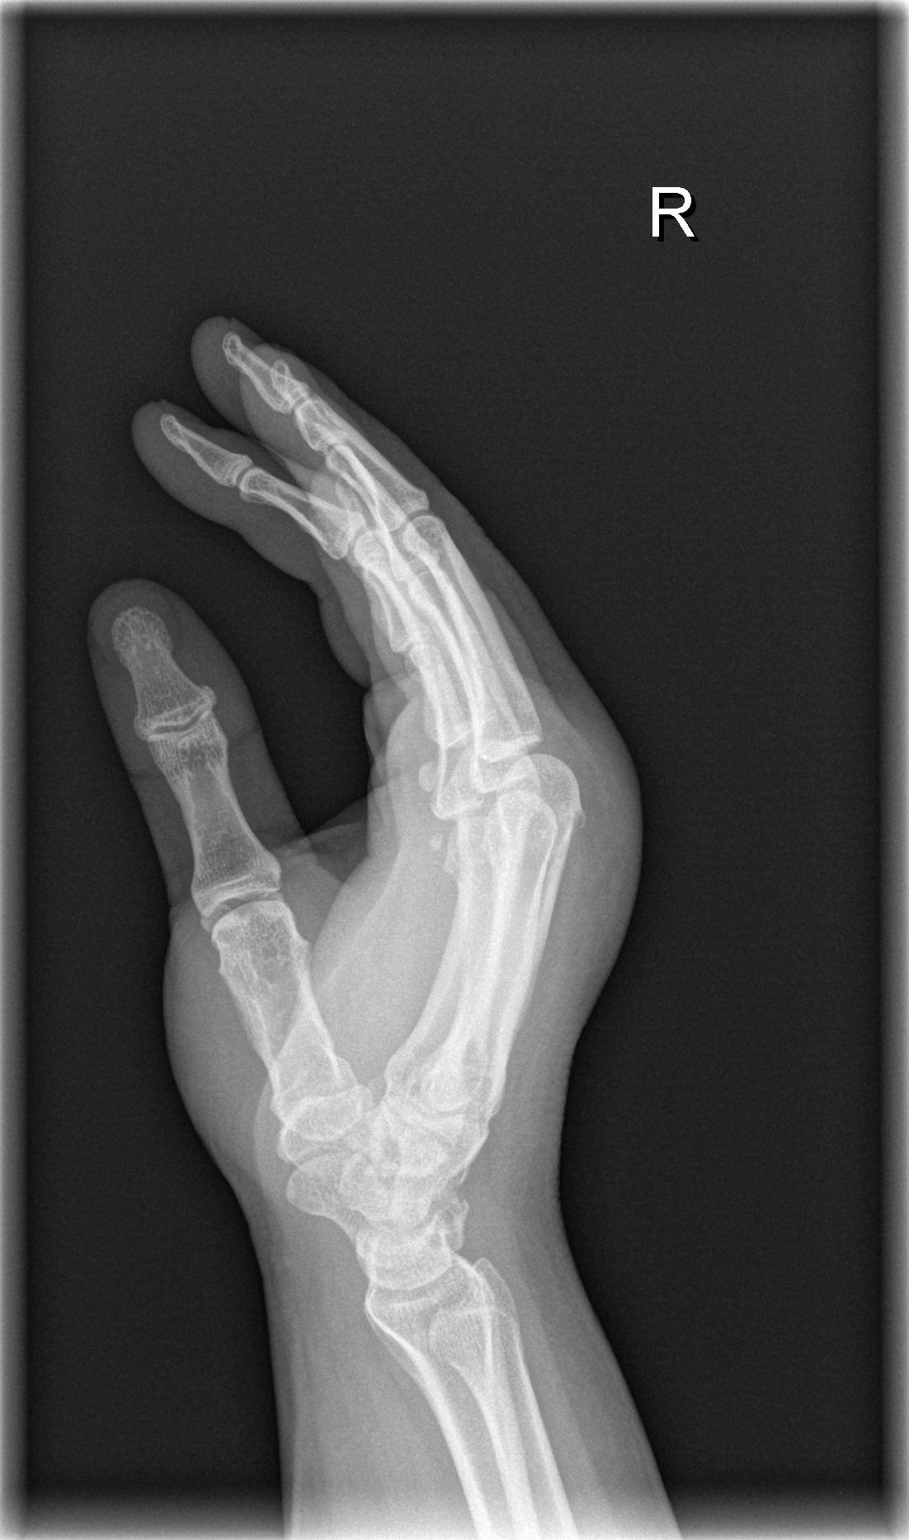

[3 of 3 positions shown; findings below may reference images not displayed]

FINDINGS: Corticated ossicle adjacent to the ulnar styloid as
before.  Carpal rows intact. Negative for fracture, dislocation, or
other acute abnormality.  Normal alignment and mineralization. No
significant degenerative change.  Regional soft tissues
unremarkable.
IMPRESSION: Negative.

## 2010-06-30 LAB — CBC
HCT: 38.5 % (ref 36.0–46.0)
HCT: 39.9 % (ref 36.0–46.0)
Hemoglobin: 13.4 g/dL (ref 12.0–15.0)
Hemoglobin: 13.7 g/dL (ref 12.0–15.0)
MCH: 33.4 pg (ref 26.0–34.0)
MCH: 33.6 pg (ref 26.0–34.0)
MCHC: 34.4 g/dL (ref 30.0–36.0)
MCHC: 34.9 g/dL (ref 30.0–36.0)
MCV: 96.3 fL (ref 78.0–100.0)
MCV: 97 fL (ref 78.0–100.0)
Platelets: 286 10*3/uL (ref 150–400)
Platelets: 339 10*3/uL (ref 150–400)
RBC: 4.01 MIL/uL (ref 3.87–5.11)
RBC: 4.11 MIL/uL (ref 3.87–5.11)
RDW: 13.8 % (ref 11.5–15.5)
RDW: 13.9 % (ref 11.5–15.5)
WBC: 7 10*3/uL (ref 4.0–10.5)
WBC: 8.6 10*3/uL (ref 4.0–10.5)

## 2010-06-30 LAB — BASIC METABOLIC PANEL
BUN: 7 mg/dL (ref 6–23)
BUN: 9 mg/dL (ref 6–23)
CO2: 23 mEq/L (ref 19–32)
CO2: 24 mEq/L (ref 19–32)
Calcium: 9.2 mg/dL (ref 8.4–10.5)
Calcium: 9.6 mg/dL (ref 8.4–10.5)
Chloride: 104 mEq/L (ref 96–112)
Chloride: 105 mEq/L (ref 96–112)
Creatinine, Ser: 0.69 mg/dL (ref 0.4–1.2)
Creatinine, Ser: 0.79 mg/dL (ref 0.4–1.2)
GFR calc Af Amer: >60 mL/min (ref >60)
GFR calc Af Amer: >60 mL/min (ref >60)
GFR calc non Af Amer: >60 mL/min (ref >60)
GFR calc non Af Amer: >60 mL/min (ref >60)
Glucose, Bld: 98 mg/dL (ref 70–99)
Glucose, Bld: 99 mg/dL (ref 70–99)
Potassium: 3.8 mEq/L (ref 3.5–5.1)
Potassium: 4 mEq/L (ref 3.5–5.1)
Sodium: 137 mEq/L (ref 135–145)
Sodium: 138 mEq/L (ref 135–145)

## 2010-06-30 LAB — URINALYSIS, ROUTINE W REFLEX MICROSCOPIC
Bilirubin Urine: NEGATIVE
Bilirubin Urine: NEGATIVE
Glucose, UA: NEGATIVE mg/dL
Glucose, UA: NEGATIVE mg/dL
Hgb urine dipstick: NEGATIVE
Hgb urine dipstick: NEGATIVE
Ketones, ur: NEGATIVE mg/dL
Ketones, ur: NEGATIVE mg/dL
Nitrite: NEGATIVE
Nitrite: NEGATIVE
Protein, ur: NEGATIVE mg/dL
Protein, ur: NEGATIVE mg/dL
Specific Gravity, Urine: 1.006 (ref 1.005–1.030)
Specific Gravity, Urine: 1.014 (ref 1.005–1.030)
Urobilinogen, UA: 1 mg/dL (ref 0.0–1.0)
Urobilinogen, UA: 1 mg/dL (ref 0.0–1.0)
pH: 6 (ref 5.0–8.0)
pH: 6.5 (ref 5.0–8.0)

## 2010-06-30 LAB — DIFFERENTIAL
Basophils Absolute: 0 10*3/uL (ref 0.0–0.1)
Basophils Absolute: 0.1 10*3/uL (ref 0.0–0.1)
Basophils Relative: 1 % (ref 0–1)
Basophils Relative: 1 % (ref 0–1)
Eosinophils Absolute: 0.1 10*3/uL (ref 0.0–0.7)
Eosinophils Absolute: 0.1 10*3/uL (ref 0.0–0.7)
Eosinophils Relative: 2 % (ref 0–5)
Eosinophils Relative: 2 % (ref 0–5)
Lymphocytes Relative: 25 % (ref 12–46)
Lymphocytes Relative: 30 % (ref 12–46)
Lymphs Abs: 2.1 10*3/uL (ref 0.7–4.0)
Lymphs Abs: 2.2 10*3/uL (ref 0.7–4.0)
Monocytes Absolute: 0.5 10*3/uL (ref 0.1–1.0)
Monocytes Absolute: 0.6 10*3/uL (ref 0.1–1.0)
Monocytes Relative: 6 % (ref 3–12)
Monocytes Relative: 8 % (ref 3–12)
Neutro Abs: 4 10*3/uL (ref 1.7–7.7)
Neutro Abs: 5.7 10*3/uL (ref 1.7–7.7)
Neutrophils Relative %: 58 % (ref 43–77)
Neutrophils Relative %: 67 % (ref 43–77)

## 2010-06-30 LAB — POCT I-STAT, CHEM 8
BUN: 8 mg/dL (ref 6–23)
Calcium, Ion: 1.16 mmol/L (ref 1.12–1.32)
Chloride: 105 mEq/L (ref 96–112)
Creatinine, Ser: 0.8 mg/dL (ref 0.4–1.2)
Glucose, Bld: 74 mg/dL (ref 70–99)
HCT: 43 % (ref 36.0–46.0)
Hemoglobin: 14.6 g/dL (ref 12.0–15.0)
Potassium: 3.5 mEq/L (ref 3.5–5.1)
Sodium: 139 mEq/L (ref 135–145)
TCO2: 27 mmol/L (ref 0–100)

## 2010-06-30 LAB — RAPID URINE DRUG SCREEN, HOSP PERFORMED
Amphetamines: POSITIVE — AB
Barbiturates: NOT DETECTED
Benzodiazepines: POSITIVE — AB
Cocaine: POSITIVE — AB
Opiates: NOT DETECTED
Tetrahydrocannabinol: NOT DETECTED

## 2010-06-30 LAB — URINE MICROSCOPIC-ADD ON

## 2010-06-30 LAB — GLUCOSE, CAPILLARY
Glucose-Capillary: 130 mg/dL — ABNORMAL HIGH (ref 70–99)
Glucose-Capillary: 140 mg/dL — ABNORMAL HIGH (ref 70–99)

## 2010-06-30 LAB — POCT PREGNANCY, URINE: Preg Test, Ur: NEGATIVE

## 2010-07-04 LAB — CSF CELL COUNT WITH DIFFERENTIAL
RBC Count, CSF: 175 /mm3 — ABNORMAL HIGH
RBC Count, CSF: 4 /mm3 — ABNORMAL HIGH
Tube #: 1
Tube #: 3
WBC, CSF: 1 /mm3 (ref 0–5)
WBC, CSF: 5 /mm3 (ref 0–5)

## 2010-07-04 LAB — CSF CULTURE W GRAM STAIN

## 2010-07-04 LAB — CBC
HCT: 36.6 % (ref 36.0–46.0)
Hemoglobin: 12.7 g/dL (ref 12.0–15.0)
MCH: 33.3 pg (ref 26.0–34.0)
MCHC: 34.6 g/dL (ref 30.0–36.0)
MCV: 96.1 fL (ref 78.0–100.0)
Platelets: 283 10*3/uL (ref 150–400)
RBC: 3.81 MIL/uL — ABNORMAL LOW (ref 3.87–5.11)
RDW: 13.2 % (ref 11.5–15.5)
WBC: 7.6 10*3/uL (ref 4.0–10.5)

## 2010-07-04 LAB — GLUCOSE, CSF: Glucose, CSF: 55 mg/dL (ref 43–76)

## 2010-07-04 LAB — CSF CULTURE: Culture: NO GROWTH

## 2010-07-04 LAB — BASIC METABOLIC PANEL
BUN: 9 mg/dL (ref 6–23)
CO2: 26 mEq/L (ref 19–32)
Calcium: 9.4 mg/dL (ref 8.4–10.5)
Chloride: 104 mEq/L (ref 96–112)
Creatinine, Ser: 0.52 mg/dL (ref 0.4–1.2)
GFR calc Af Amer: >60 mL/min (ref >60)
GFR calc non Af Amer: >60 mL/min (ref >60)
Glucose, Bld: 86 mg/dL (ref 70–99)
Potassium: 3.9 mEq/L (ref 3.5–5.1)
Sodium: 138 mEq/L (ref 135–145)

## 2010-07-04 LAB — GRAM STAIN

## 2010-07-04 LAB — PROTIME-INR
INR: 0.96 (ref 0.00–1.49)
Prothrombin Time: 12.7 seconds (ref 11.6–15.2)

## 2010-07-04 LAB — PROTEIN, CSF: Total  Protein, CSF: 25 mg/dL (ref 15–45)

## 2010-07-04 LAB — APTT: aPTT: 31 seconds (ref 24–37)

## 2010-07-05 LAB — POCT HEMOGLOBIN-HEMACUE: Hemoglobin: 11.9 g/dL — ABNORMAL LOW (ref 12.0–15.0)

## 2010-07-05 LAB — BASIC METABOLIC PANEL
BUN: 8 mg/dL (ref 6–23)
CO2: 24 mEq/L (ref 19–32)
Calcium: 9.4 mg/dL (ref 8.4–10.5)
Chloride: 109 mEq/L (ref 96–112)
Creatinine, Ser: 0.61 mg/dL (ref 0.4–1.2)
GFR calc Af Amer: >60 mL/min (ref >60)
GFR calc non Af Amer: >60 mL/min (ref >60)
Glucose, Bld: 121 mg/dL — ABNORMAL HIGH (ref 70–99)
Potassium: 4.1 mEq/L (ref 3.5–5.1)
Sodium: 138 mEq/L (ref 135–145)

## 2010-07-06 LAB — CK TOTAL AND CKMB (NOT AT ARMC)
CK, MB: 0.6 ng/mL (ref 0.3–4.0)
CK, MB: 0.7 ng/mL (ref 0.3–4.0)
Relative Index: INVALID (ref 0.0–2.5)
Relative Index: INVALID (ref 0.0–2.5)
Total CK: 40 U/L (ref 7–177)
Total CK: 42 U/L (ref 7–177)

## 2010-07-06 LAB — BASIC METABOLIC PANEL
BUN: 3 mg/dL — ABNORMAL LOW (ref 6–23)
CO2: 28 mEq/L (ref 19–32)
Calcium: 8.8 mg/dL (ref 8.4–10.5)
Chloride: 107 mEq/L (ref 96–112)
Creatinine, Ser: 0.5 mg/dL (ref 0.4–1.2)
GFR calc Af Amer: >60 mL/min (ref >60)
GFR calc non Af Amer: >60 mL/min (ref >60)
Glucose, Bld: 100 mg/dL — ABNORMAL HIGH (ref 70–99)
Potassium: 3.3 mEq/L — ABNORMAL LOW (ref 3.5–5.1)
Sodium: 140 mEq/L (ref 135–145)

## 2010-07-06 LAB — DIFFERENTIAL
Basophils Absolute: 0 10*3/uL (ref 0.0–0.1)
Basophils Relative: 0 % (ref 0–1)
Eosinophils Absolute: 0.1 10*3/uL (ref 0.0–0.7)
Eosinophils Relative: 1 % (ref 0–5)
Lymphocytes Relative: 29 % (ref 12–46)
Lymphs Abs: 2.2 10*3/uL (ref 0.7–4.0)
Monocytes Absolute: 0.4 10*3/uL (ref 0.1–1.0)
Monocytes Relative: 5 % (ref 3–12)
Neutro Abs: 4.9 10*3/uL (ref 1.7–7.7)
Neutrophils Relative %: 64 % (ref 43–77)

## 2010-07-06 LAB — CBC
HCT: 32.6 % — ABNORMAL LOW (ref 36.0–46.0)
HCT: 38.3 % (ref 36.0–46.0)
Hemoglobin: 11.2 g/dL — ABNORMAL LOW (ref 12.0–15.0)
Hemoglobin: 13.4 g/dL (ref 12.0–15.0)
MCHC: 34.3 g/dL (ref 30.0–36.0)
MCHC: 34.9 g/dL (ref 30.0–36.0)
MCV: 96.5 fL (ref 78.0–100.0)
MCV: 95.9 fL (ref 78.0–100.0)
Platelets: 313 10*3/uL (ref 150–400)
Platelets: 318 10*3/uL (ref 150–400)
RBC: 3.38 MIL/uL — ABNORMAL LOW (ref 3.87–5.11)
RBC: 3.99 MIL/uL (ref 3.87–5.11)
RDW: 13.7 % (ref 11.5–15.5)
RDW: 13.6 % (ref 11.5–15.5)
WBC: 7.7 10*3/uL (ref 4.0–10.5)
WBC: 8 10*3/uL (ref 4.0–10.5)

## 2010-07-06 LAB — COMPREHENSIVE METABOLIC PANEL
ALT: 23 U/L (ref 0–35)
AST: 29 U/L (ref 0–37)
Albumin: 3.4 g/dL — ABNORMAL LOW (ref 3.5–5.2)
Alkaline Phosphatase: 67 U/L (ref 39–117)
BUN: 7 mg/dL (ref 6–23)
CO2: 25 mEq/L (ref 19–32)
Calcium: 8.7 mg/dL (ref 8.4–10.5)
Chloride: 106 mEq/L (ref 96–112)
Creatinine, Ser: 0.47 mg/dL (ref 0.4–1.2)
GFR calc Af Amer: >60 mL/min (ref >60)
GFR calc non Af Amer: >60 mL/min (ref >60)
Glucose, Bld: 94 mg/dL (ref 70–99)
Potassium: 3.9 mEq/L (ref 3.5–5.1)
Sodium: 138 mEq/L (ref 135–145)
Total Bilirubin: 0.6 mg/dL (ref 0.3–1.2)
Total Protein: 6.5 g/dL (ref 6.0–8.3)

## 2010-07-06 LAB — URINALYSIS, ROUTINE W REFLEX MICROSCOPIC
Bilirubin Urine: NEGATIVE
Glucose, UA: NEGATIVE mg/dL
Hgb urine dipstick: NEGATIVE
Ketones, ur: NEGATIVE mg/dL
Nitrite: NEGATIVE
Protein, ur: NEGATIVE mg/dL
Specific Gravity, Urine: 1.011 (ref 1.005–1.030)
Urobilinogen, UA: 1 mg/dL (ref 0.0–1.0)
pH: 6.5 (ref 5.0–8.0)

## 2010-07-06 LAB — HEMOGLOBIN A1C
Hgb A1c MFr Bld: 5.8 % — ABNORMAL HIGH (ref <5.7)
Mean Plasma Glucose: 120 mg/dL — ABNORMAL HIGH (ref <117)

## 2010-07-06 LAB — TROPONIN I
Troponin I: <0.01 ng/mL (ref 0.00–0.06)
Troponin I: <0.01 ng/mL (ref 0.00–0.06)

## 2010-07-06 LAB — TSH: TSH: 0.837 u[IU]/mL (ref 0.350–4.500)

## 2010-07-06 LAB — LIPID PANEL
Cholesterol: 165 mg/dL (ref 0–200)
HDL: 35 mg/dL — ABNORMAL LOW (ref >39)
LDL Cholesterol: 85 mg/dL (ref 0–99)
Total CHOL/HDL Ratio: 4.7 RATIO
Triglycerides: 227 mg/dL — ABNORMAL HIGH (ref <150)
VLDL: 45 mg/dL — ABNORMAL HIGH (ref 0–40)

## 2010-07-06 LAB — POCT CARDIAC MARKERS
CKMB, poc: <1 ng/mL — ABNORMAL LOW (ref 1.0–8.0)
Myoglobin, poc: 36 ng/mL (ref 12–200)
Troponin i, poc: <0.05 ng/mL (ref 0.00–0.09)

## 2010-07-06 LAB — APTT: aPTT: 29 seconds (ref 24–37)

## 2010-07-06 LAB — PROTIME-INR
INR: 1.05 (ref 0.00–1.49)
Prothrombin Time: 13.6 seconds (ref 11.6–15.2)

## 2010-07-06 LAB — D-DIMER, QUANTITATIVE: D-Dimer, Quant: 1.14 ug/mL-FEU — ABNORMAL HIGH (ref 0.00–0.48)

## 2010-07-07 LAB — URINALYSIS, ROUTINE W REFLEX MICROSCOPIC
Bilirubin Urine: NEGATIVE
Glucose, UA: NEGATIVE mg/dL
Hgb urine dipstick: NEGATIVE
Ketones, ur: NEGATIVE mg/dL
Nitrite: NEGATIVE
Protein, ur: NEGATIVE mg/dL
Specific Gravity, Urine: 1.019 (ref 1.005–1.030)
Urobilinogen, UA: 0.2 mg/dL (ref 0.0–1.0)
pH: 5.5 (ref 5.0–8.0)

## 2010-07-07 LAB — COMPREHENSIVE METABOLIC PANEL
ALT: 25 U/L (ref 0–35)
AST: 22 U/L (ref 0–37)
Albumin: 3.9 g/dL (ref 3.5–5.2)
Alkaline Phosphatase: 73 U/L (ref 39–117)
BUN: 7 mg/dL (ref 6–23)
CO2: 25 mEq/L (ref 19–32)
Calcium: 9.4 mg/dL (ref 8.4–10.5)
Chloride: 103 mEq/L (ref 96–112)
Creatinine, Ser: 0.61 mg/dL (ref 0.4–1.2)
GFR calc Af Amer: >60 mL/min (ref >60)
GFR calc non Af Amer: >60 mL/min (ref >60)
Glucose, Bld: 81 mg/dL (ref 70–99)
Potassium: 3.7 mEq/L (ref 3.5–5.1)
Sodium: 136 mEq/L (ref 135–145)
Total Bilirubin: 0.2 mg/dL — ABNORMAL LOW (ref 0.3–1.2)
Total Protein: 7.5 g/dL (ref 6.0–8.3)

## 2010-07-07 LAB — DIFFERENTIAL
Basophils Absolute: 0 10*3/uL (ref 0.0–0.1)
Basophils Relative: 0 % (ref 0–1)
Eosinophils Absolute: 0.2 10*3/uL (ref 0.0–0.7)
Eosinophils Relative: 2 % (ref 0–5)
Lymphocytes Relative: 22 % (ref 12–46)
Lymphs Abs: 1.7 10*3/uL (ref 0.7–4.0)
Monocytes Absolute: 0.4 10*3/uL (ref 0.1–1.0)
Monocytes Relative: 5 % (ref 3–12)
Neutro Abs: 5.7 10*3/uL (ref 1.7–7.7)
Neutrophils Relative %: 70 % (ref 43–77)

## 2010-07-07 LAB — CBC
HCT: 38.5 % (ref 36.0–46.0)
Hemoglobin: 12.8 g/dL (ref 12.0–15.0)
MCHC: 33.3 g/dL (ref 30.0–36.0)
MCV: 96.4 fL (ref 78.0–100.0)
Platelets: 301 10*3/uL (ref 150–400)
RBC: 3.99 MIL/uL (ref 3.87–5.11)
RDW: 13.8 % (ref 11.5–15.5)
WBC: 8 10*3/uL (ref 4.0–10.5)

## 2010-07-07 LAB — PREGNANCY, URINE: Preg Test, Ur: NEGATIVE

## 2010-07-08 LAB — URINALYSIS, ROUTINE W REFLEX MICROSCOPIC
Bilirubin Urine: NEGATIVE
Bilirubin Urine: NEGATIVE
Bilirubin Urine: NEGATIVE
Glucose, UA: NEGATIVE mg/dL
Glucose, UA: NEGATIVE mg/dL
Glucose, UA: NEGATIVE mg/dL
Hgb urine dipstick: NEGATIVE
Hgb urine dipstick: NEGATIVE
Hgb urine dipstick: NEGATIVE
Ketones, ur: NEGATIVE mg/dL
Ketones, ur: NEGATIVE mg/dL
Ketones, ur: NEGATIVE mg/dL
Nitrite: NEGATIVE
Nitrite: NEGATIVE
Nitrite: NEGATIVE
Protein, ur: NEGATIVE mg/dL
Protein, ur: NEGATIVE mg/dL
Protein, ur: NEGATIVE mg/dL
Specific Gravity, Urine: 1.026 (ref 1.005–1.030)
Specific Gravity, Urine: 1.001 — ABNORMAL LOW (ref 1.005–1.030)
Specific Gravity, Urine: 1.02 (ref 1.005–1.030)
Urobilinogen, UA: 0.2 mg/dL (ref 0.0–1.0)
Urobilinogen, UA: 0.2 mg/dL (ref 0.0–1.0)
Urobilinogen, UA: 0.2 mg/dL (ref 0.0–1.0)
pH: 5.5 (ref 5.0–8.0)
pH: 6.5 (ref 5.0–8.0)
pH: 7.5 (ref 5.0–8.0)

## 2010-07-08 LAB — DIFFERENTIAL
Basophils Absolute: 0 10*3/uL (ref 0.0–0.1)
Basophils Absolute: 0 10*3/uL (ref 0.0–0.1)
Basophils Relative: 0 % (ref 0–1)
Basophils Relative: 0 % (ref 0–1)
Eosinophils Absolute: 0.2 10*3/uL (ref 0.0–0.7)
Eosinophils Absolute: 0.2 10*3/uL (ref 0.0–0.7)
Eosinophils Relative: 3 % (ref 0–5)
Eosinophils Relative: 3 % (ref 0–5)
Lymphocytes Relative: 33 % (ref 12–46)
Lymphocytes Relative: 40 % (ref 12–46)
Lymphs Abs: 2 10*3/uL (ref 0.7–4.0)
Lymphs Abs: 3.2 10*3/uL (ref 0.7–4.0)
Monocytes Absolute: 0.4 10*3/uL (ref 0.1–1.0)
Monocytes Absolute: 0.4 10*3/uL (ref 0.1–1.0)
Monocytes Relative: 5 % (ref 3–12)
Monocytes Relative: 6 % (ref 3–12)
Neutro Abs: 3.6 10*3/uL (ref 1.7–7.7)
Neutro Abs: 4.2 10*3/uL (ref 1.7–7.7)
Neutrophils Relative %: 52 % (ref 43–77)
Neutrophils Relative %: 58 % (ref 43–77)

## 2010-07-08 LAB — URINE CULTURE
Colony Count: NO GROWTH
Culture: NO GROWTH

## 2010-07-08 LAB — CBC
HCT: 36 % (ref 36.0–46.0)
HCT: 38.5 % (ref 36.0–46.0)
Hemoglobin: 12.1 g/dL (ref 12.0–15.0)
Hemoglobin: 12.9 g/dL (ref 12.0–15.0)
MCHC: 33.5 g/dL (ref 30.0–36.0)
MCHC: 33.7 g/dL (ref 30.0–36.0)
MCV: 94.7 fL (ref 78.0–100.0)
MCV: 95.4 fL (ref 78.0–100.0)
Platelets: 315 10*3/uL (ref 150–400)
Platelets: 348 10*3/uL (ref 150–400)
RBC: 3.77 MIL/uL — ABNORMAL LOW (ref 3.87–5.11)
RBC: 4.07 MIL/uL (ref 3.87–5.11)
RDW: 13.6 % (ref 11.5–15.5)
RDW: 13.7 % (ref 11.5–15.5)
WBC: 6.1 10*3/uL (ref 4.0–10.5)
WBC: 8.1 10*3/uL (ref 4.0–10.5)

## 2010-07-08 LAB — COMPREHENSIVE METABOLIC PANEL
ALT: 32 U/L (ref 0–35)
ALT: 35 U/L (ref 0–35)
AST: 26 U/L (ref 0–37)
AST: 30 U/L (ref 0–37)
Albumin: 3.4 g/dL — ABNORMAL LOW (ref 3.5–5.2)
Albumin: 3.9 g/dL (ref 3.5–5.2)
Alkaline Phosphatase: 57 U/L (ref 39–117)
Alkaline Phosphatase: 65 U/L (ref 39–117)
BUN: 4 mg/dL — ABNORMAL LOW (ref 6–23)
BUN: 7 mg/dL (ref 6–23)
CO2: 21 mEq/L (ref 19–32)
CO2: 28 mEq/L (ref 19–32)
Calcium: 9.1 mg/dL (ref 8.4–10.5)
Calcium: 9.5 mg/dL (ref 8.4–10.5)
Chloride: 103 mEq/L (ref 96–112)
Chloride: 106 mEq/L (ref 96–112)
Creatinine, Ser: 0.65 mg/dL (ref 0.4–1.2)
Creatinine, Ser: 0.67 mg/dL (ref 0.4–1.2)
GFR calc Af Amer: >60 mL/min (ref >60)
GFR calc Af Amer: >60 mL/min (ref >60)
GFR calc non Af Amer: >60 mL/min (ref >60)
GFR calc non Af Amer: >60 mL/min (ref >60)
Glucose, Bld: 90 mg/dL (ref 70–99)
Glucose, Bld: 97 mg/dL (ref 70–99)
Potassium: 3.6 mEq/L (ref 3.5–5.1)
Potassium: 3.8 mEq/L (ref 3.5–5.1)
Sodium: 138 mEq/L (ref 135–145)
Sodium: 139 mEq/L (ref 135–145)
Total Bilirubin: 0.3 mg/dL (ref 0.3–1.2)
Total Bilirubin: 0.6 mg/dL (ref 0.3–1.2)
Total Protein: 6.7 g/dL (ref 6.0–8.3)
Total Protein: 7.2 g/dL (ref 6.0–8.3)

## 2010-07-08 LAB — POCT I-STAT, CHEM 8
BUN: 10 mg/dL (ref 6–23)
Calcium, Ion: 1.17 mmol/L (ref 1.12–1.32)
Chloride: 105 mEq/L (ref 96–112)
Creatinine, Ser: 0.7 mg/dL (ref 0.4–1.2)
Glucose, Bld: 123 mg/dL — ABNORMAL HIGH (ref 70–99)
HCT: 36 % (ref 36.0–46.0)
Hemoglobin: 12.2 g/dL (ref 12.0–15.0)
Potassium: 3.6 mEq/L (ref 3.5–5.1)
Sodium: 137 mEq/L (ref 135–145)
TCO2: 27 mmol/L (ref 0–100)

## 2010-07-08 LAB — LIPASE, BLOOD
Lipase: 22 U/L (ref 11–59)
Lipase: 24 U/L (ref 11–59)

## 2010-07-08 LAB — HEMOCCULT GUIAC POC 1CARD (OFFICE): Fecal Occult Bld: NEGATIVE

## 2010-07-08 LAB — GLUCOSE, CAPILLARY: Glucose-Capillary: 101 mg/dL — ABNORMAL HIGH (ref 70–99)

## 2010-07-08 LAB — URINE MICROSCOPIC-ADD ON

## 2010-07-08 LAB — ACETAMINOPHEN LEVEL: Acetaminophen (Tylenol), Serum: <10 ug/mL — ABNORMAL LOW (ref 10–30)

## 2010-07-08 LAB — PREGNANCY, URINE: Preg Test, Ur: NEGATIVE

## 2010-07-22 LAB — BASIC METABOLIC PANEL
BUN: 6 mg/dL (ref 6–23)
CO2: 30 mEq/L (ref 19–32)
Calcium: 9.3 mg/dL (ref 8.4–10.5)
Chloride: 104 mEq/L (ref 96–112)
Creatinine, Ser: 0.75 mg/dL (ref 0.4–1.2)
GFR calc Af Amer: >60 mL/min (ref >60)
GFR calc non Af Amer: >60 mL/min (ref >60)
Glucose, Bld: 80 mg/dL (ref 70–99)
Potassium: 4.6 mEq/L (ref 3.5–5.1)
Sodium: 139 mEq/L (ref 135–145)

## 2010-07-22 LAB — CBC
HCT: 33.6 % — ABNORMAL LOW (ref 36.0–46.0)
Hemoglobin: 11.8 g/dL — ABNORMAL LOW (ref 12.0–15.0)
MCHC: 35.2 g/dL (ref 30.0–36.0)
MCV: 95.9 fL (ref 78.0–100.0)
Platelets: 287 10*3/uL (ref 150–400)
RBC: 3.5 MIL/uL — ABNORMAL LOW (ref 3.87–5.11)
RDW: 14 % (ref 11.5–15.5)
WBC: 6.4 10*3/uL (ref 4.0–10.5)

## 2010-07-22 LAB — HEPATIC FUNCTION PANEL
ALT: 23 U/L (ref 0–35)
AST: 17 U/L (ref 0–37)
Albumin: 3.4 g/dL — ABNORMAL LOW (ref 3.5–5.2)
Alkaline Phosphatase: 52 U/L (ref 39–117)
Bilirubin, Direct: <0.1 mg/dL (ref 0.0–0.3)
Total Bilirubin: 0.3 mg/dL (ref 0.3–1.2)
Total Protein: 6 g/dL (ref 6.0–8.3)

## 2010-07-23 LAB — POCT URINALYSIS DIP (DEVICE)
Bilirubin Urine: NEGATIVE
Glucose, UA: NEGATIVE mg/dL
Hgb urine dipstick: NEGATIVE
Ketones, ur: NEGATIVE mg/dL
Nitrite: NEGATIVE
Protein, ur: NEGATIVE mg/dL
Specific Gravity, Urine: 1.015 (ref 1.005–1.030)
Urobilinogen, UA: 0.2 mg/dL (ref 0.0–1.0)
pH: 5 (ref 5.0–8.0)

## 2010-07-23 LAB — URINE CULTURE
Colony Count: NO GROWTH
Culture: NO GROWTH

## 2010-07-23 LAB — WET PREP, GENITAL
Trich, Wet Prep: NONE SEEN
Yeast Wet Prep HPF POC: NONE SEEN

## 2010-07-23 LAB — GC/CHLAMYDIA PROBE AMP, GENITAL
Chlamydia, DNA Probe: NEGATIVE
GC Probe Amp, Genital: NEGATIVE

## 2010-07-24 LAB — URINALYSIS, ROUTINE W REFLEX MICROSCOPIC
Bilirubin Urine: NEGATIVE
Glucose, UA: NEGATIVE mg/dL
Hgb urine dipstick: NEGATIVE
Ketones, ur: NEGATIVE mg/dL
Nitrite: NEGATIVE
Protein, ur: NEGATIVE mg/dL
Specific Gravity, Urine: 1.005 (ref 1.005–1.030)
Urobilinogen, UA: 0.2 mg/dL (ref 0.0–1.0)
pH: 6 (ref 5.0–8.0)

## 2010-07-24 LAB — PREGNANCY, URINE: Preg Test, Ur: NEGATIVE

## 2010-07-30 ENCOUNTER — Emergency Department (INDEPENDENT_AMBULATORY_CARE_PROVIDER_SITE_OTHER): Payer: Medicare Other

## 2010-07-30 ENCOUNTER — Emergency Department (HOSPITAL_BASED_OUTPATIENT_CLINIC_OR_DEPARTMENT_OTHER)
Admission: EM | Admit: 2010-07-30 | Discharge: 2010-07-30 | Disposition: A | Discharge status: Home / Self Care | Payer: Medicare Other | Attending: Emergency Medicine | Admitting: Emergency Medicine

## 2010-07-30 DIAGNOSIS — E162 Hypoglycemia, unspecified: Secondary | ICD-10-CM | POA: Insufficient documentation

## 2010-07-30 DIAGNOSIS — W19XXXA Unspecified fall, initial encounter: Secondary | ICD-10-CM

## 2010-07-30 DIAGNOSIS — R55 Syncope and collapse: Secondary | ICD-10-CM

## 2010-07-30 DIAGNOSIS — M5030 Other cervical disc degeneration, unspecified cervical region: Secondary | ICD-10-CM

## 2010-07-30 DIAGNOSIS — Z981 Arthrodesis status: Secondary | ICD-10-CM

## 2010-07-30 DIAGNOSIS — G8929 Other chronic pain: Secondary | ICD-10-CM | POA: Insufficient documentation

## 2010-07-30 DIAGNOSIS — K219 Gastro-esophageal reflux disease without esophagitis: Secondary | ICD-10-CM | POA: Insufficient documentation

## 2010-07-30 DIAGNOSIS — M545 Low back pain, unspecified: Secondary | ICD-10-CM | POA: Insufficient documentation

## 2010-07-30 DIAGNOSIS — M542 Cervicalgia: Secondary | ICD-10-CM

## 2010-07-30 DIAGNOSIS — Z79899 Other long term (current) drug therapy: Secondary | ICD-10-CM | POA: Insufficient documentation

## 2010-07-30 DIAGNOSIS — R42 Dizziness and giddiness: Secondary | ICD-10-CM

## 2010-07-30 DIAGNOSIS — J45909 Unspecified asthma, uncomplicated: Secondary | ICD-10-CM | POA: Insufficient documentation

## 2010-07-30 DIAGNOSIS — Y9229 Other specified public building as the place of occurrence of the external cause: Secondary | ICD-10-CM | POA: Insufficient documentation

## 2010-07-30 LAB — BASIC METABOLIC PANEL
BUN: 13 mg/dL (ref 6–23)
CO2: 26 mEq/L (ref 19–32)
Calcium: 9.1 mg/dL (ref 8.4–10.5)
Chloride: 107 mEq/L (ref 96–112)
Creatinine, Ser: 0.6 mg/dL (ref 0.4–1.2)
GFR calc Af Amer: >60 mL/min (ref >60)
GFR calc non Af Amer: >60 mL/min (ref >60)
Glucose, Bld: 77 mg/dL (ref 70–99)
Potassium: 4.1 mEq/L (ref 3.5–5.1)
Sodium: 142 mEq/L (ref 135–145)

## 2010-07-30 LAB — VALPROIC ACID LEVEL: Valproic Acid Lvl: 49.2 ug/mL — ABNORMAL LOW (ref 50.0–100.0)

## 2010-07-30 LAB — CBC
HCT: 35.4 % — ABNORMAL LOW (ref 36.0–46.0)
Hemoglobin: 11.8 g/dL — ABNORMAL LOW (ref 12.0–15.0)
MCH: 30.6 pg (ref 26.0–34.0)
MCHC: 33.3 g/dL (ref 30.0–36.0)
MCV: 91.9 fL (ref 78.0–100.0)
Platelets: 321 10*3/uL (ref 150–400)
RBC: 3.85 MIL/uL — ABNORMAL LOW (ref 3.87–5.11)
RDW: 13 % (ref 11.5–15.5)
WBC: 8 10*3/uL (ref 4.0–10.5)

## 2010-07-30 LAB — GLUCOSE, CAPILLARY: Glucose-Capillary: 88 mg/dL (ref 70–99)

## 2010-07-30 LAB — URINALYSIS, ROUTINE W REFLEX MICROSCOPIC
Bilirubin Urine: NEGATIVE
Glucose, UA: NEGATIVE mg/dL
Hgb urine dipstick: NEGATIVE
Ketones, ur: NEGATIVE mg/dL
Nitrite: NEGATIVE
Protein, ur: NEGATIVE mg/dL
Specific Gravity, Urine: 1.009 (ref 1.005–1.030)
Urobilinogen, UA: 0.2 mg/dL (ref 0.0–1.0)
pH: 7 (ref 5.0–8.0)

## 2010-07-30 LAB — RAPID URINE DRUG SCREEN, HOSP PERFORMED
Amphetamines: NOT DETECTED
Barbiturates: NOT DETECTED
Benzodiazepines: NOT DETECTED
Cocaine: NOT DETECTED
Opiates: NOT DETECTED
Tetrahydrocannabinol: NOT DETECTED

## 2010-07-30 LAB — DIFFERENTIAL
Basophils Absolute: 0 10*3/uL (ref 0.0–0.1)
Basophils Relative: 0 % (ref 0–1)
Eosinophils Absolute: 0.1 10*3/uL (ref 0.0–0.7)
Eosinophils Relative: 2 % (ref 0–5)
Lymphocytes Relative: 27 % (ref 12–46)
Lymphs Abs: 2.2 10*3/uL (ref 0.7–4.0)
Monocytes Absolute: 0.5 10*3/uL (ref 0.1–1.0)
Monocytes Relative: 6 % (ref 3–12)
Neutro Abs: 5.2 10*3/uL (ref 1.7–7.7)
Neutrophils Relative %: 65 % (ref 43–77)

## 2010-07-30 LAB — POCT I-STAT, CHEM 8
BUN: 8 mg/dL (ref 6–23)
Calcium, Ion: 1.12 mmol/L (ref 1.12–1.32)
Chloride: 104 mEq/L (ref 96–112)
Creatinine, Ser: 0.6 mg/dL (ref 0.4–1.2)
Glucose, Bld: 118 mg/dL — ABNORMAL HIGH (ref 70–99)
HCT: 40 % (ref 36.0–46.0)
Hemoglobin: 13.6 g/dL (ref 12.0–15.0)
Potassium: 3.7 mEq/L (ref 3.5–5.1)
Sodium: 138 mEq/L (ref 135–145)
TCO2: 23 mmol/L (ref 0–100)

## 2010-07-30 LAB — PREGNANCY, URINE: Preg Test, Ur: NEGATIVE

## 2010-07-30 LAB — POCT PREGNANCY, URINE: Preg Test, Ur: NEGATIVE

## 2010-07-30 LAB — ACETAMINOPHEN LEVEL: Acetaminophen (Tylenol), Serum: <10 ug/mL — ABNORMAL LOW (ref 10–30)

## 2010-07-30 LAB — ETHANOL: Alcohol, Ethyl (B): <5 mg/dL (ref 0–10)

## 2010-07-30 LAB — SALICYLATE LEVEL: Salicylate Lvl: <4 mg/dL (ref 2.8–20.0)

## 2010-08-12 IMAGING — CR DG CHEST 2V
2 series · 2 of 2 positions shown · non-contrast
Comparison: 12/30/2008.

CLINICAL DATA: Cough and fever.

CHEST - 2 VIEW

[view not recorded (1 of 2)]
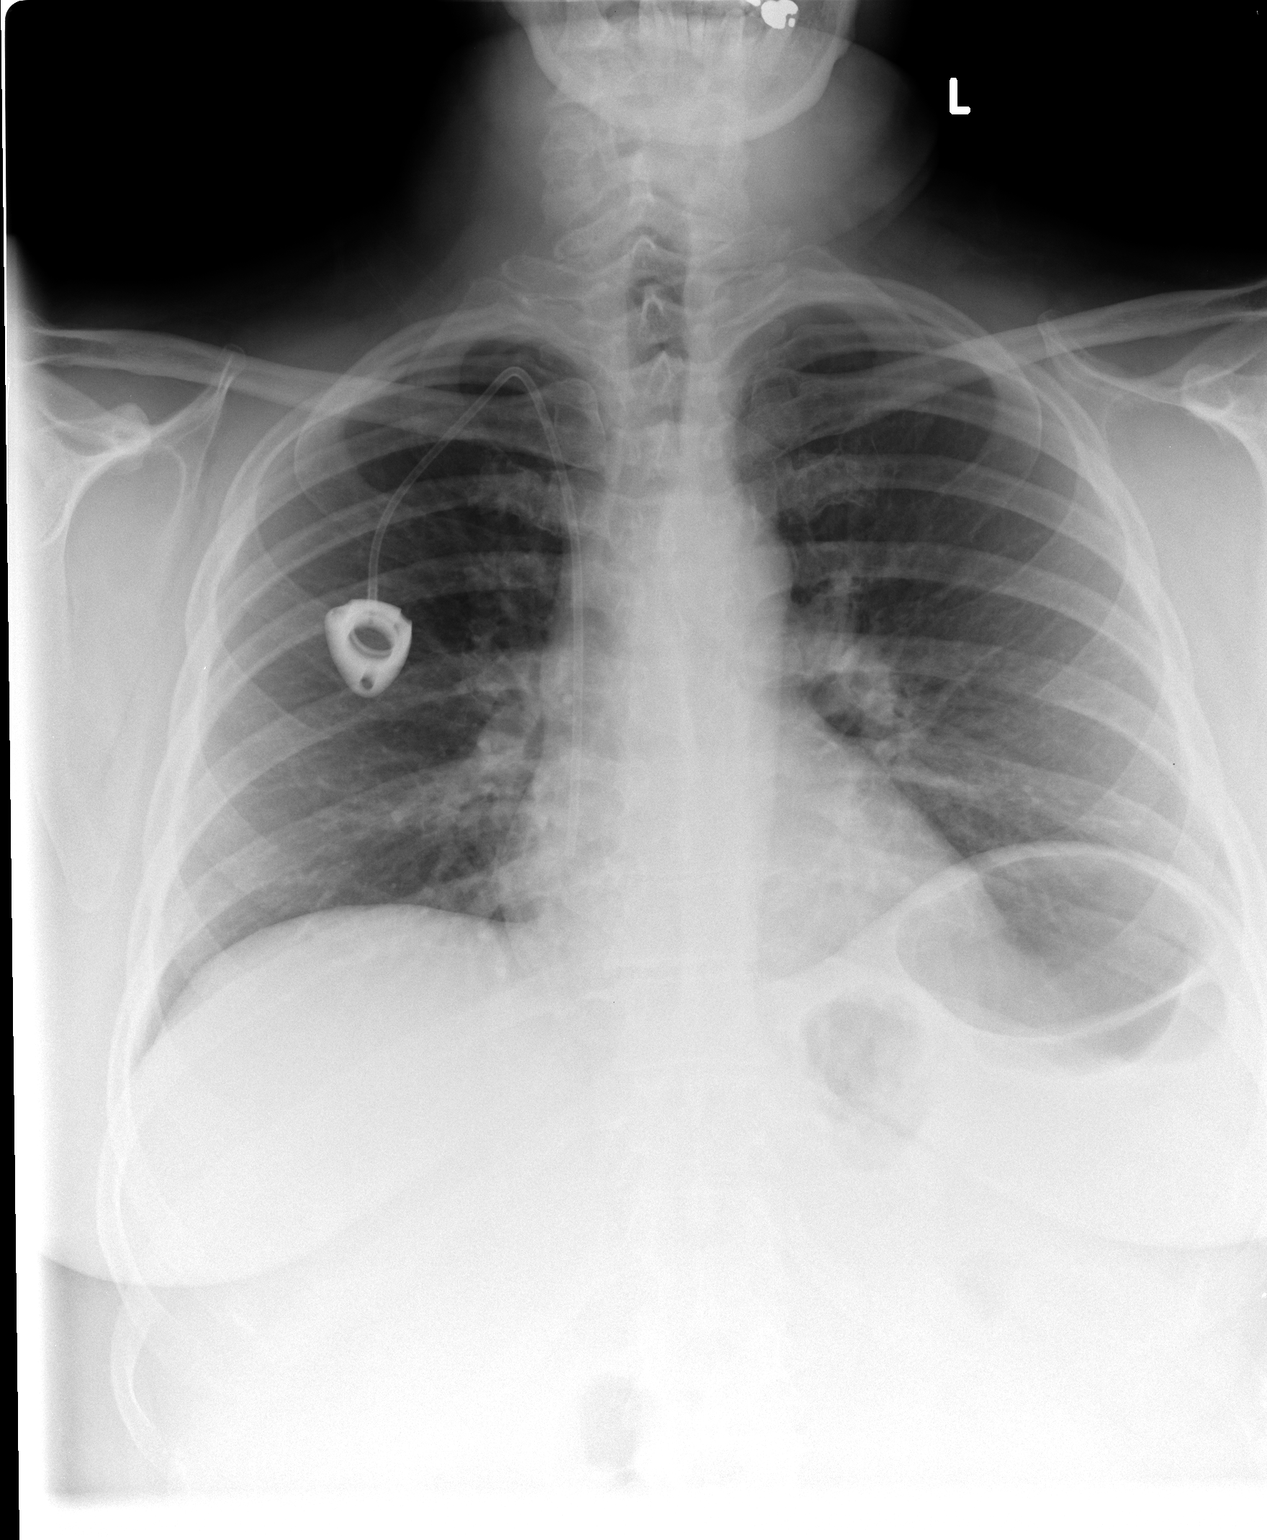

[view not recorded (2 of 2)]
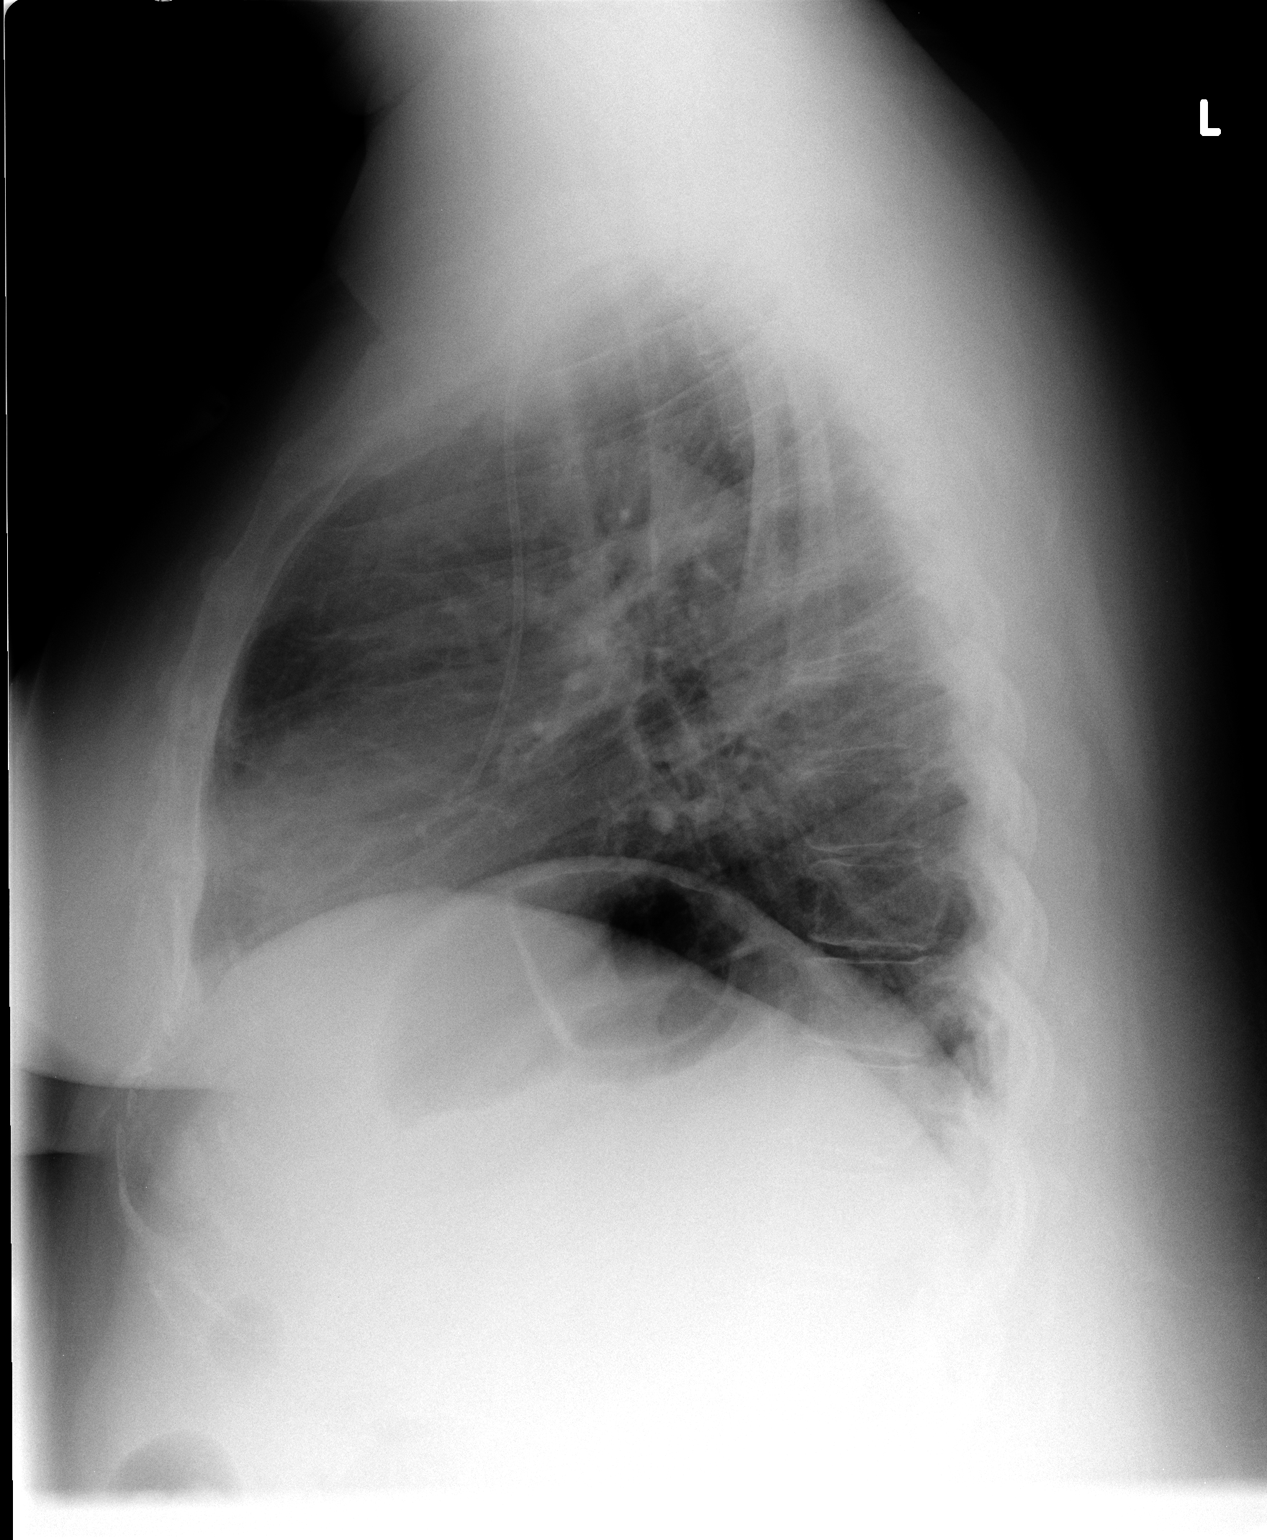

[2 of 2 positions shown; findings below may reference images not displayed]

FINDINGS: Heart size is normal and the vascularity is normal.  Port-
A-Cath tip is in the mid right atrium.  There is no infiltrate or
effusion.  No masses identified.
IMPRESSION: No active cardiopulmonary disease and no interval change.

## 2010-08-31 IMAGING — CR DG HIP (WITH OR WITHOUT PELVIS) 2-3V*L*
3 series · 3 of 3 positions shown · non-contrast
Comparison: None.

CLINICAL DATA: Fell down stairs.  Left hip pain.

LEFT HIP - COMPLETE 2+ VIEW 06/02/2009:

[t pelvis a.p.]
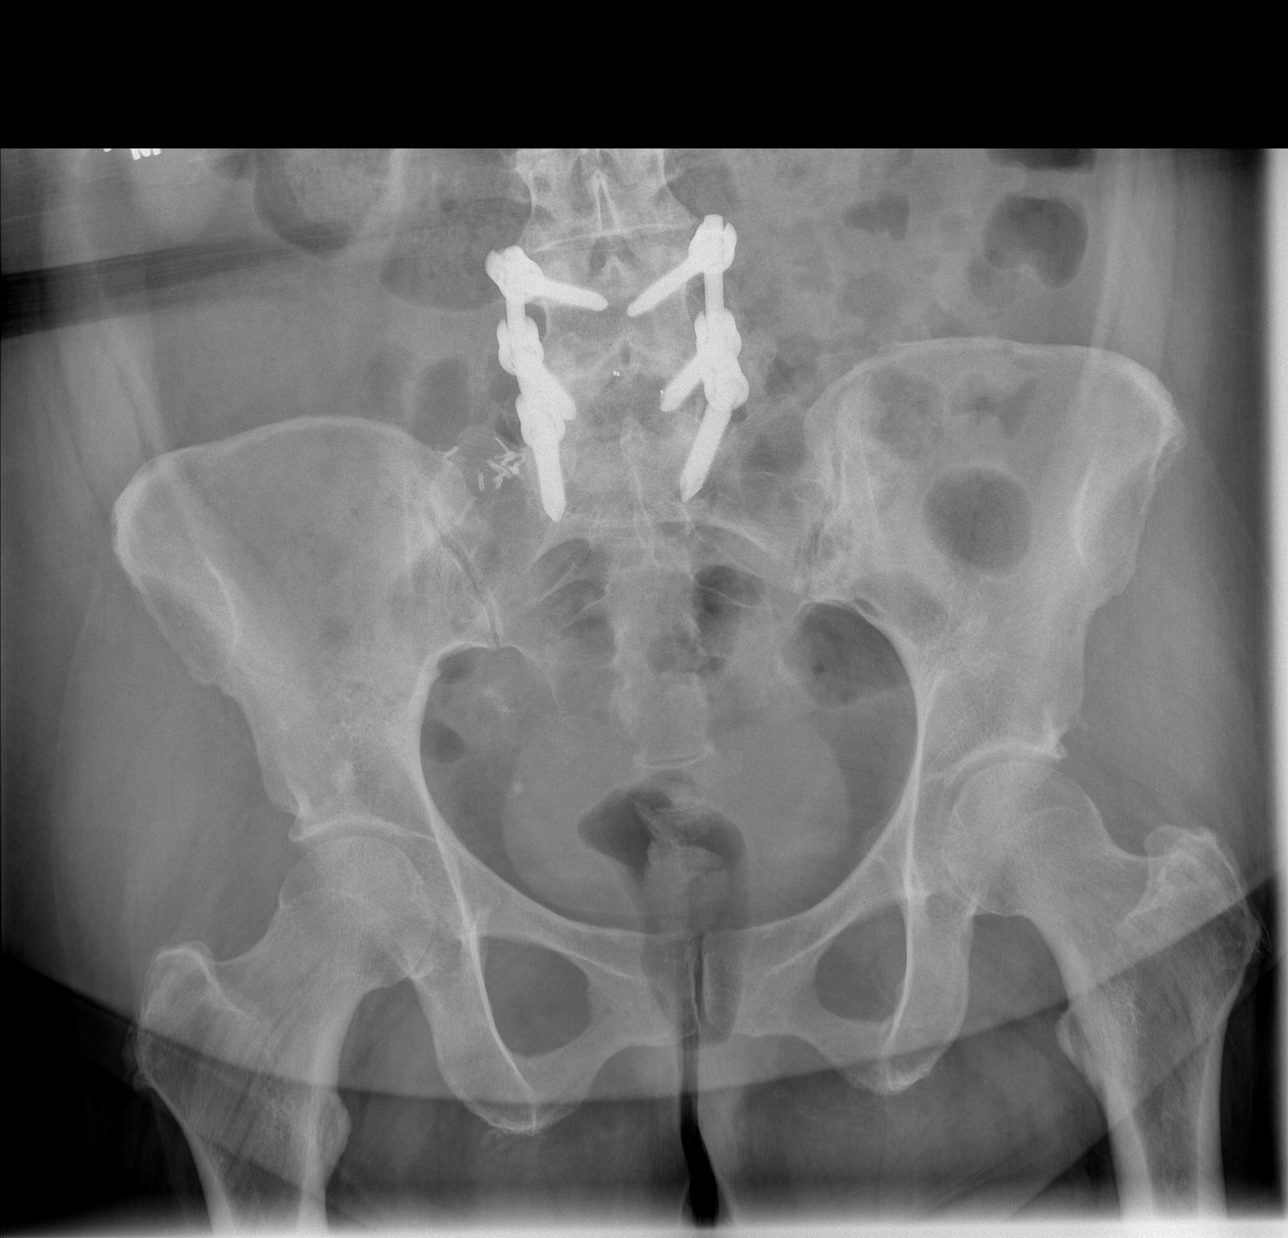

[t hip ap left]
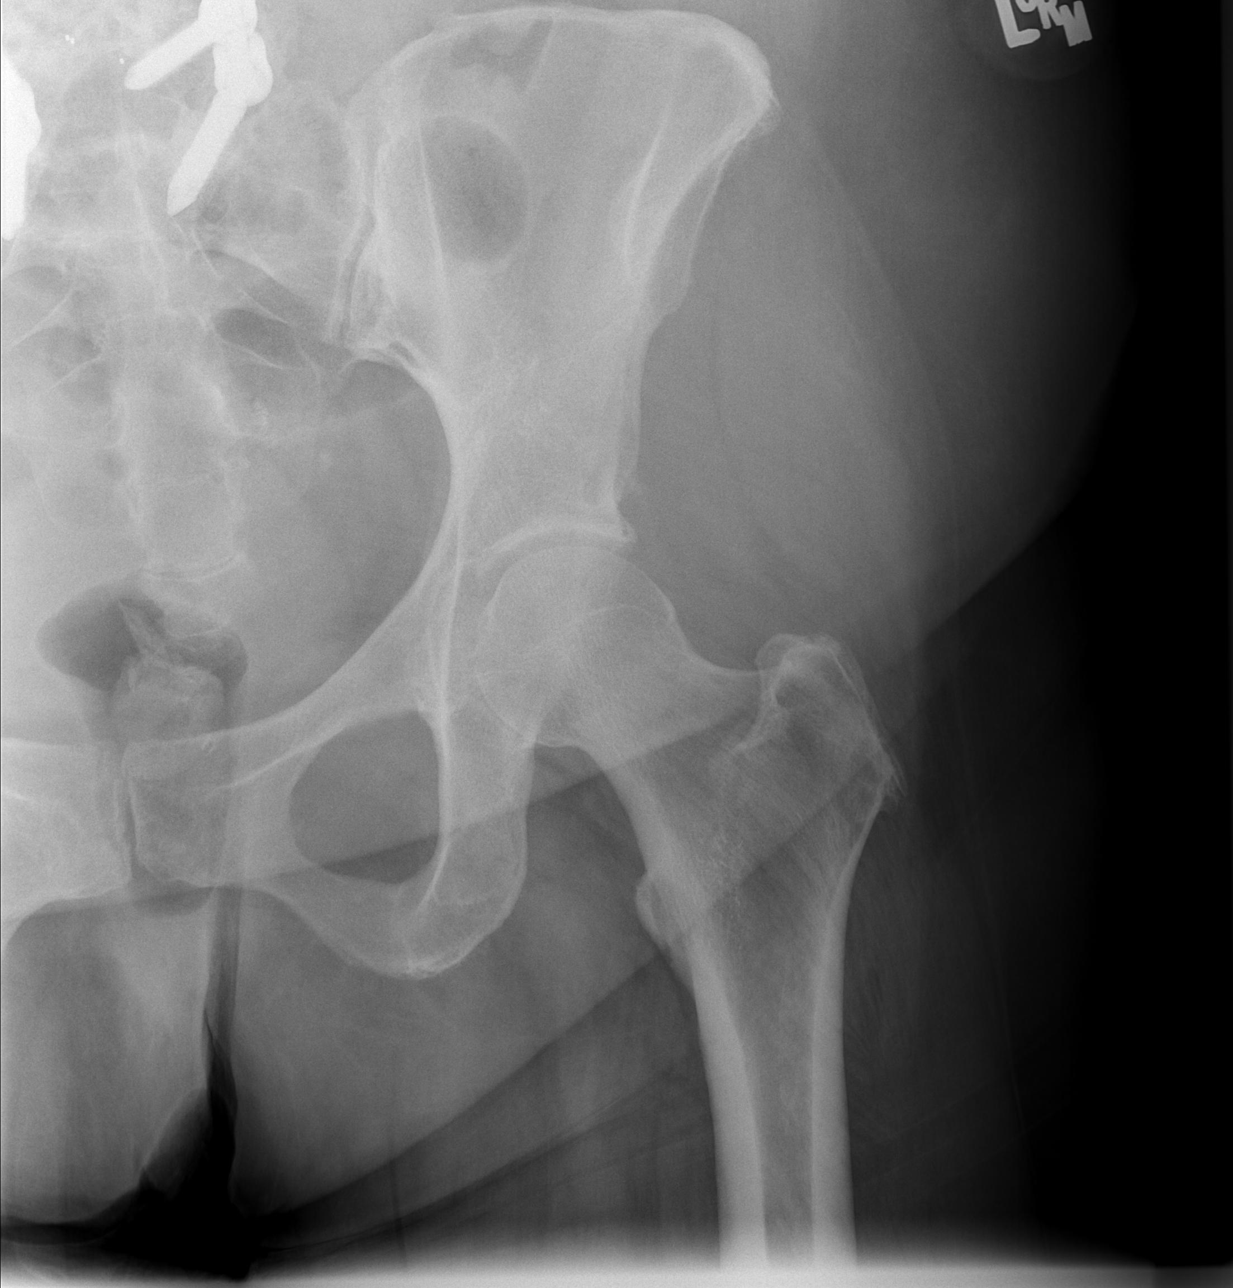

[t hip frog leg left]
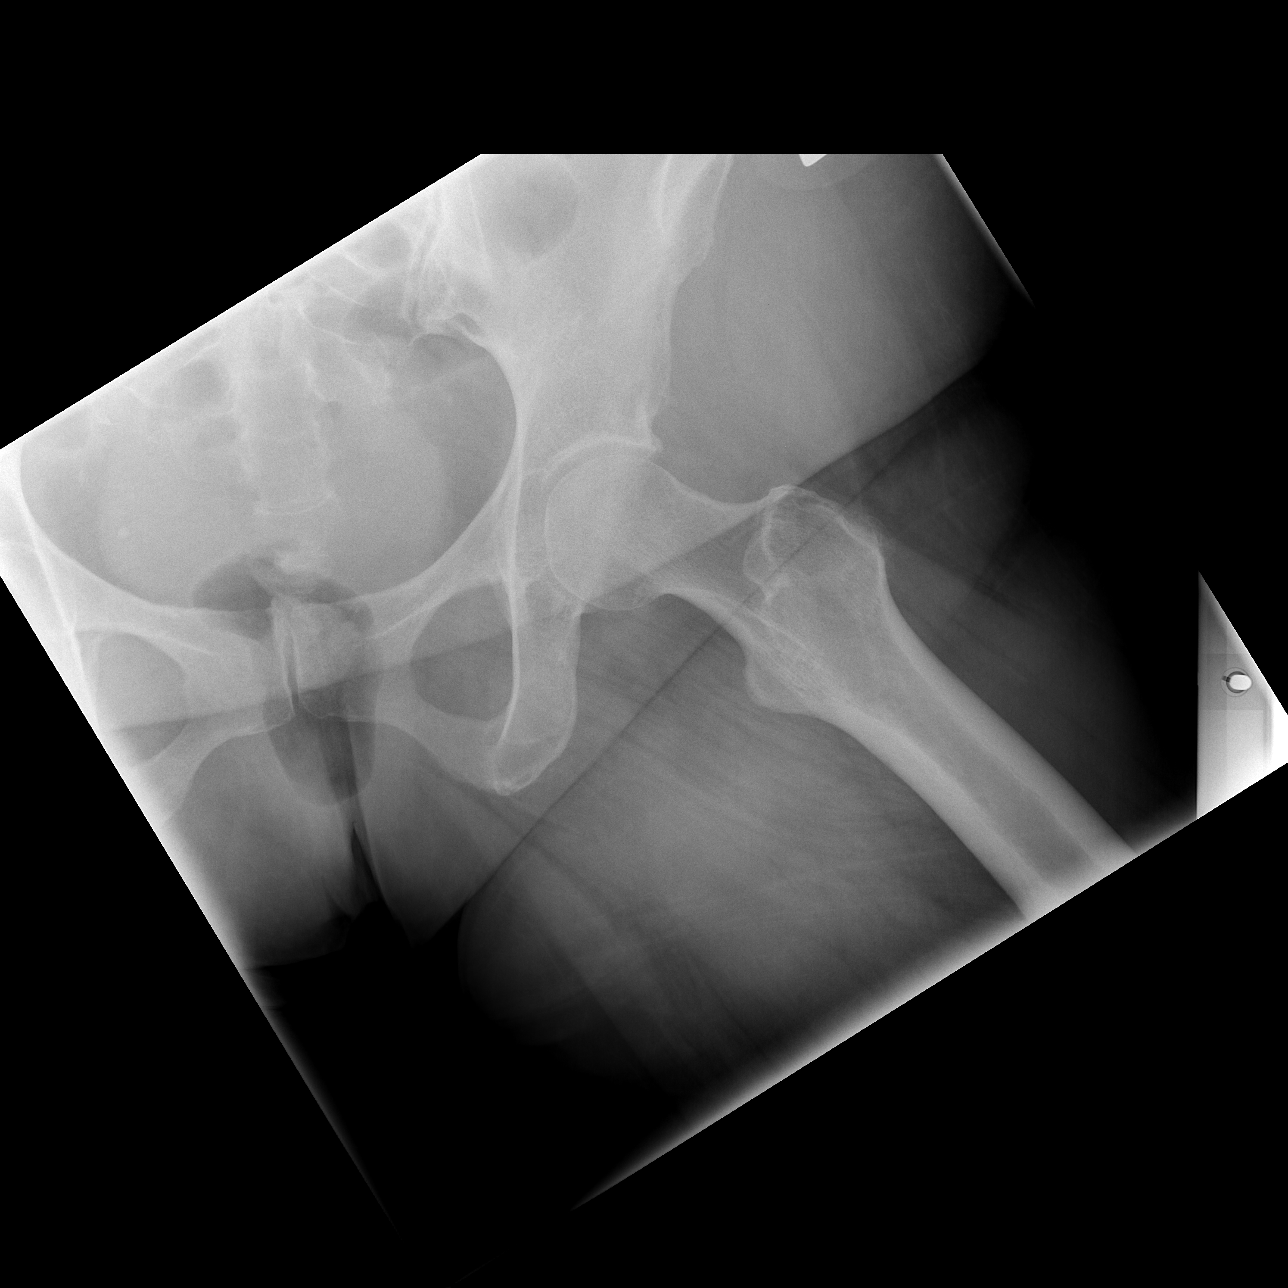

[3 of 3 positions shown; findings below may reference images not displayed]

FINDINGS: No evidence of acute fracture or dislocation.  Well-
preserved joint space.  Well-preserved bone mineral density.

Included AP pelvis shows a normal and symmetric right hip joint.
Sacroiliac joints and symphysis pubis intact.  No fractures
involving the bony pelvis.  Bone island in the right iliac bone.
IMPRESSION: Normal left hip.

## 2010-08-31 IMAGING — CR DG ANKLE COMPLETE 3+V*R*
3 series · 3 of 3 positions shown · non-contrast
Comparison: None.

CLINICAL DATA: Fell down stairs.  Right ankle pain.

RIGHT ANKLE - COMPLETE 3+ VIEW 06/02/2008:

[t ankle joint ap right]
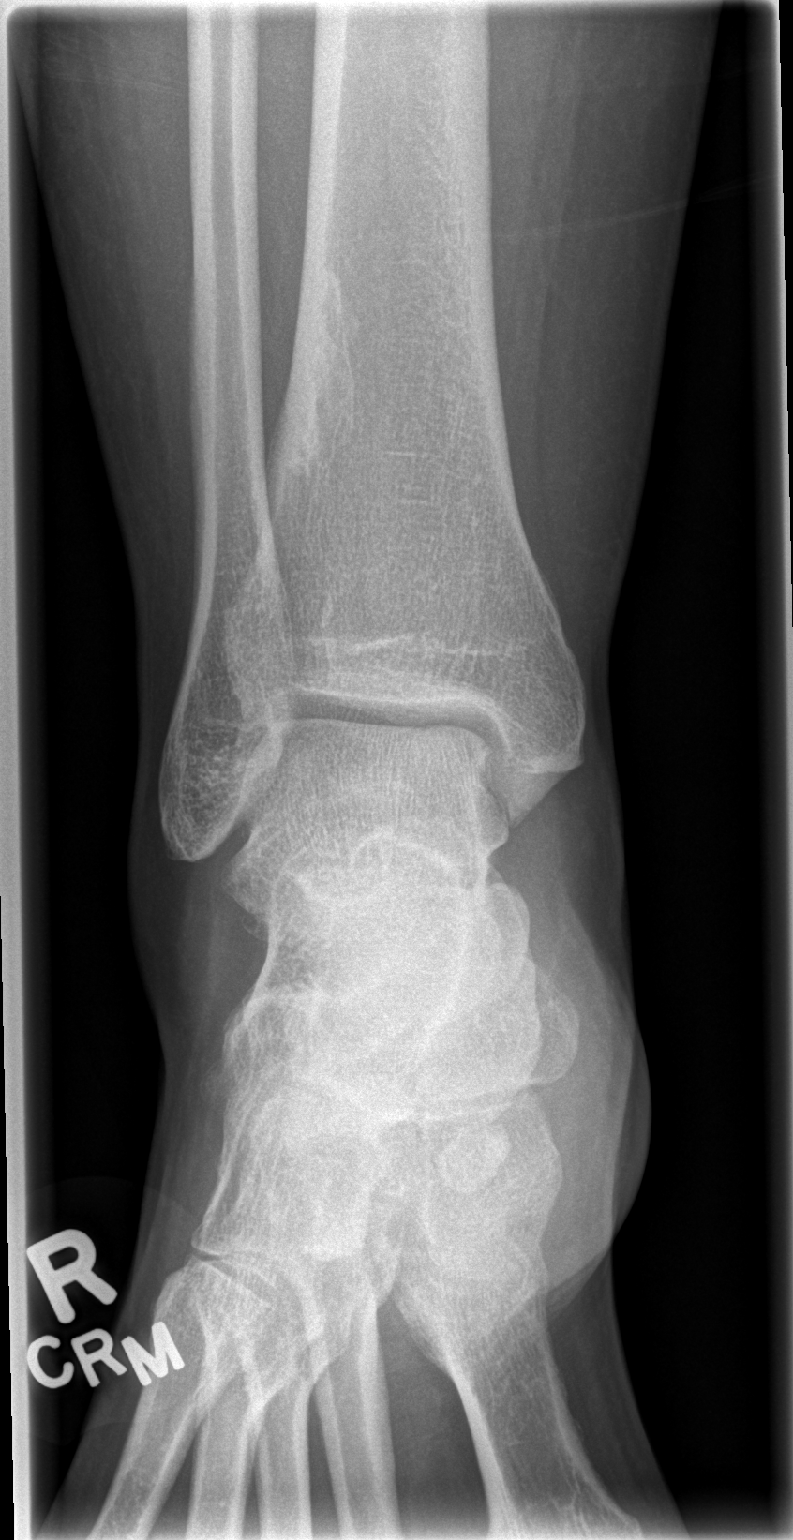

[t ankle joint oblique right *]
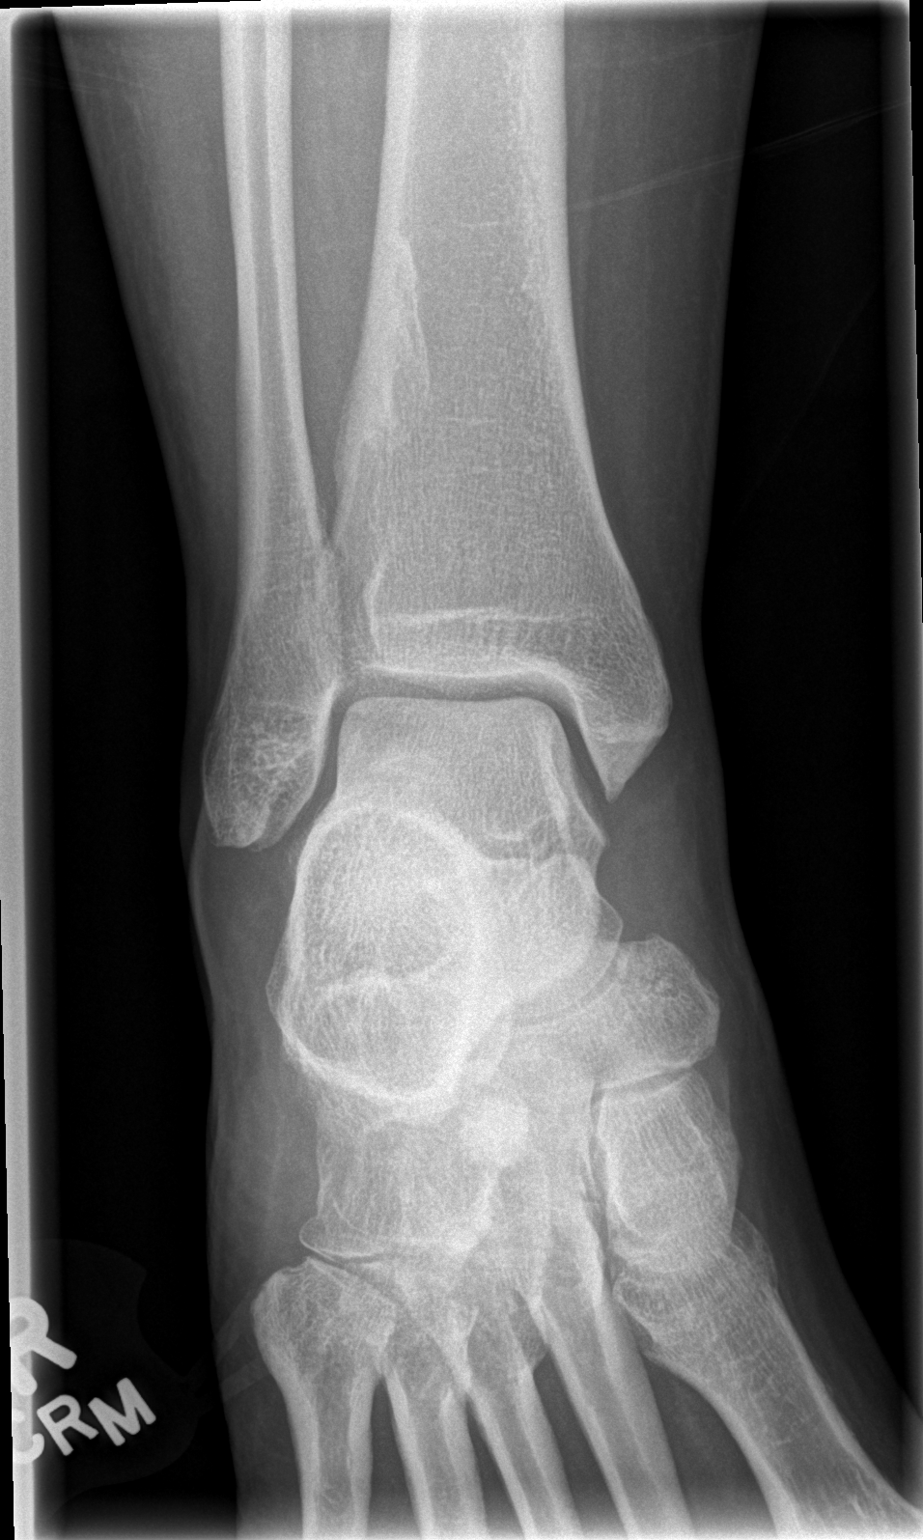

[t ankle joint lat right]
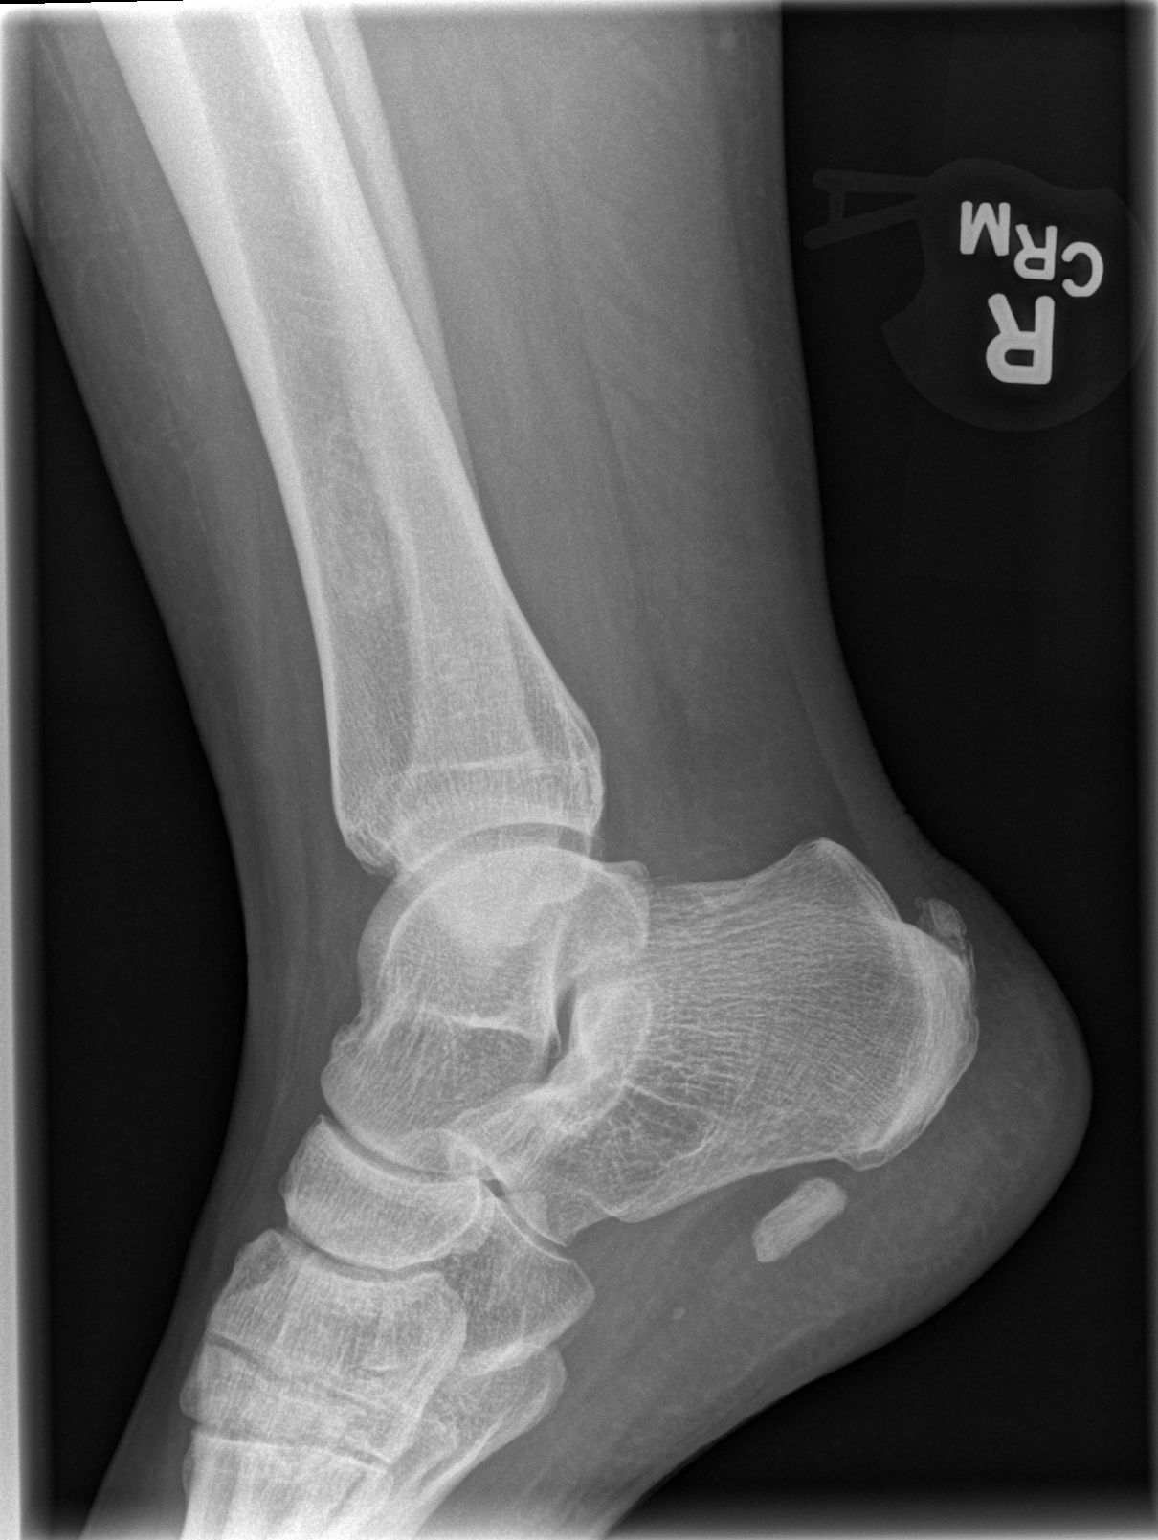

[3 of 3 positions shown; findings below may reference images not displayed]

FINDINGS: No evidence of acute fracture or dislocation.  Ankle
mortise intact with well-preserved joint space.  Healed fibroma
involving the lateral cortex of the distal tibial metaphysis.
Ossification within the plantar fascia near its insertion on the
calcaneus.  Enthesopathy at the insertion of the Achilles tendon on
the posterior calcaneus.
IMPRESSION: No acute skeletal abnormalities.  Ossifying fibroma involving the
lateral cortex of the distal tibial metaphysis.  Ossification
within the plantar fascia near its insertion on the calcaneus.

## 2010-08-31 IMAGING — CR DG LUMBAR SPINE COMPLETE 4+V
5 series · 5 of 5 positions shown · non-contrast
Comparison: Lumbar spine MRI 03/04/2009.  Lumbar spine x-rays
11/04/2006.

CLINICAL DATA: Fell down stairs.  Low back pain.  L4-S1 PLIF in
3999.

LUMBAR SPINE - COMPLETE 4+ VIEW 06/02/2009:

[t l-spine a.p.]
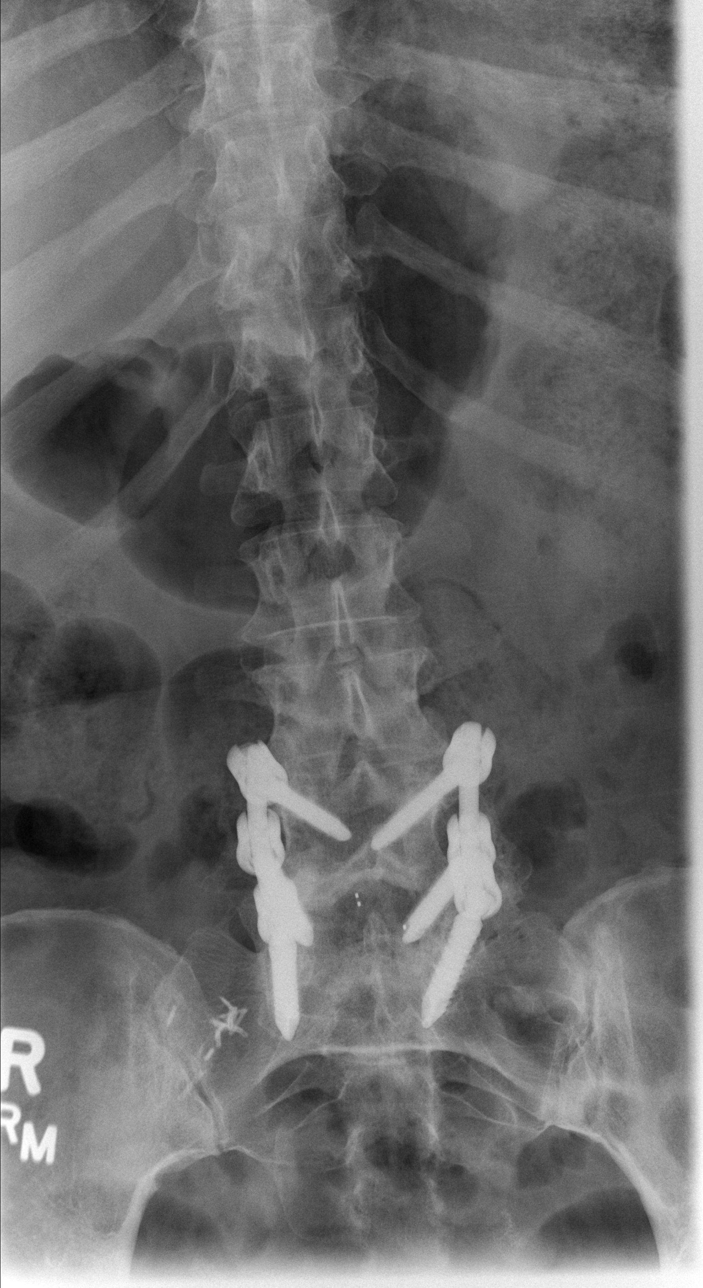

[t l-spine oblique exposure (1 of 2)]
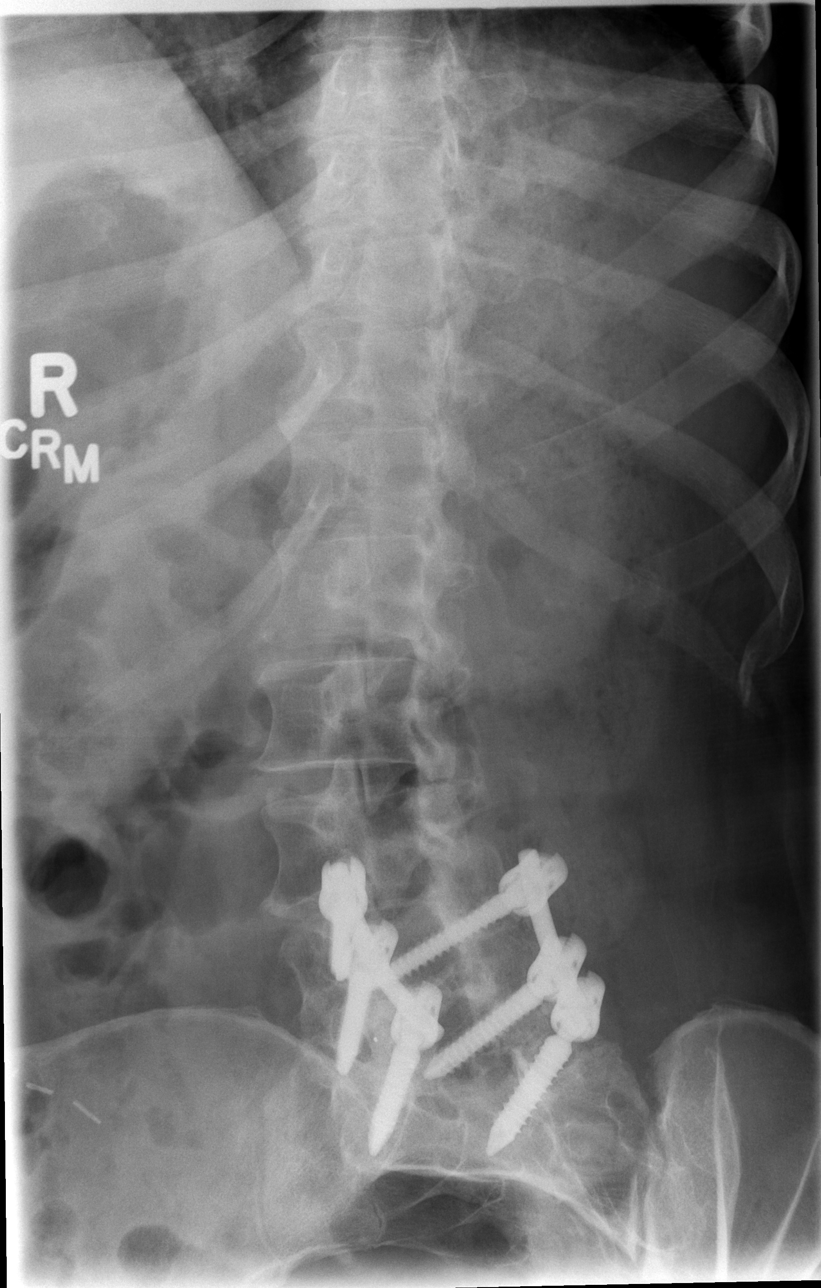

[t l-spine oblique exposure (2 of 2)]
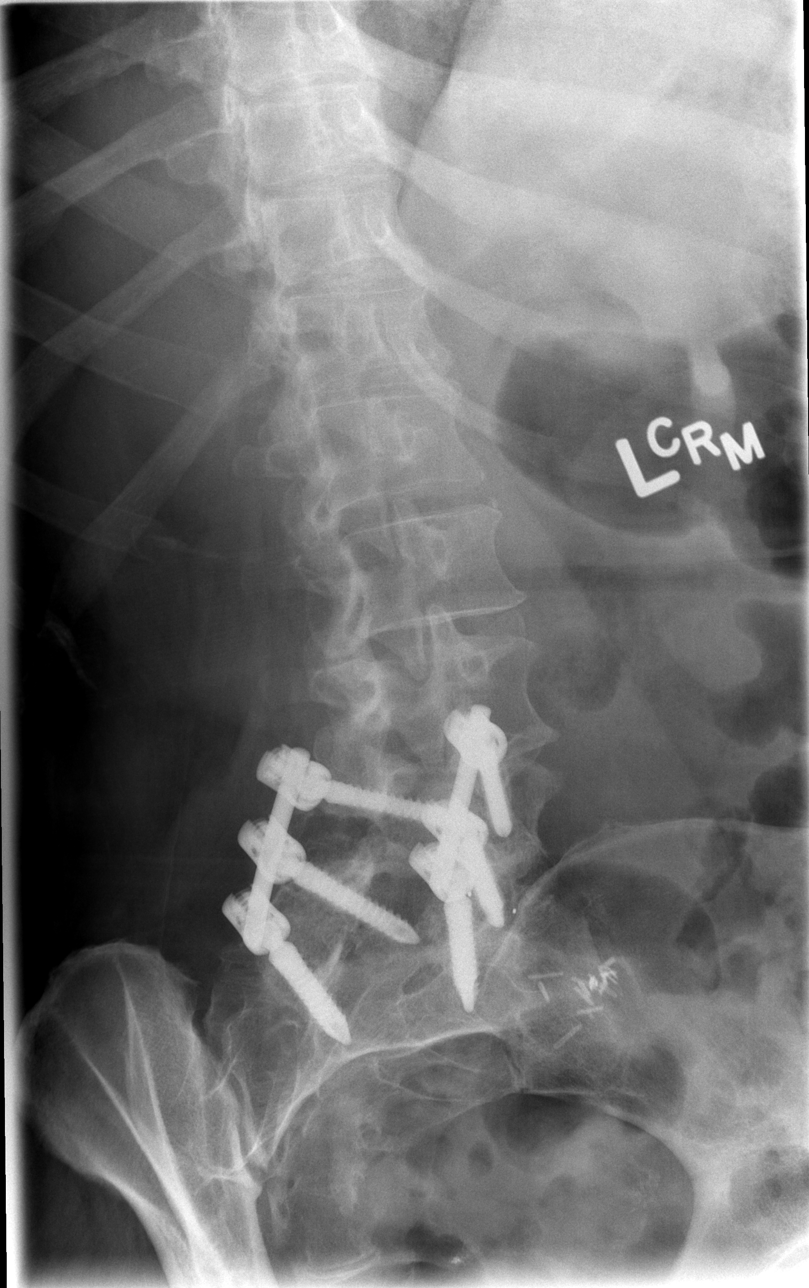

[t l-spine lat]
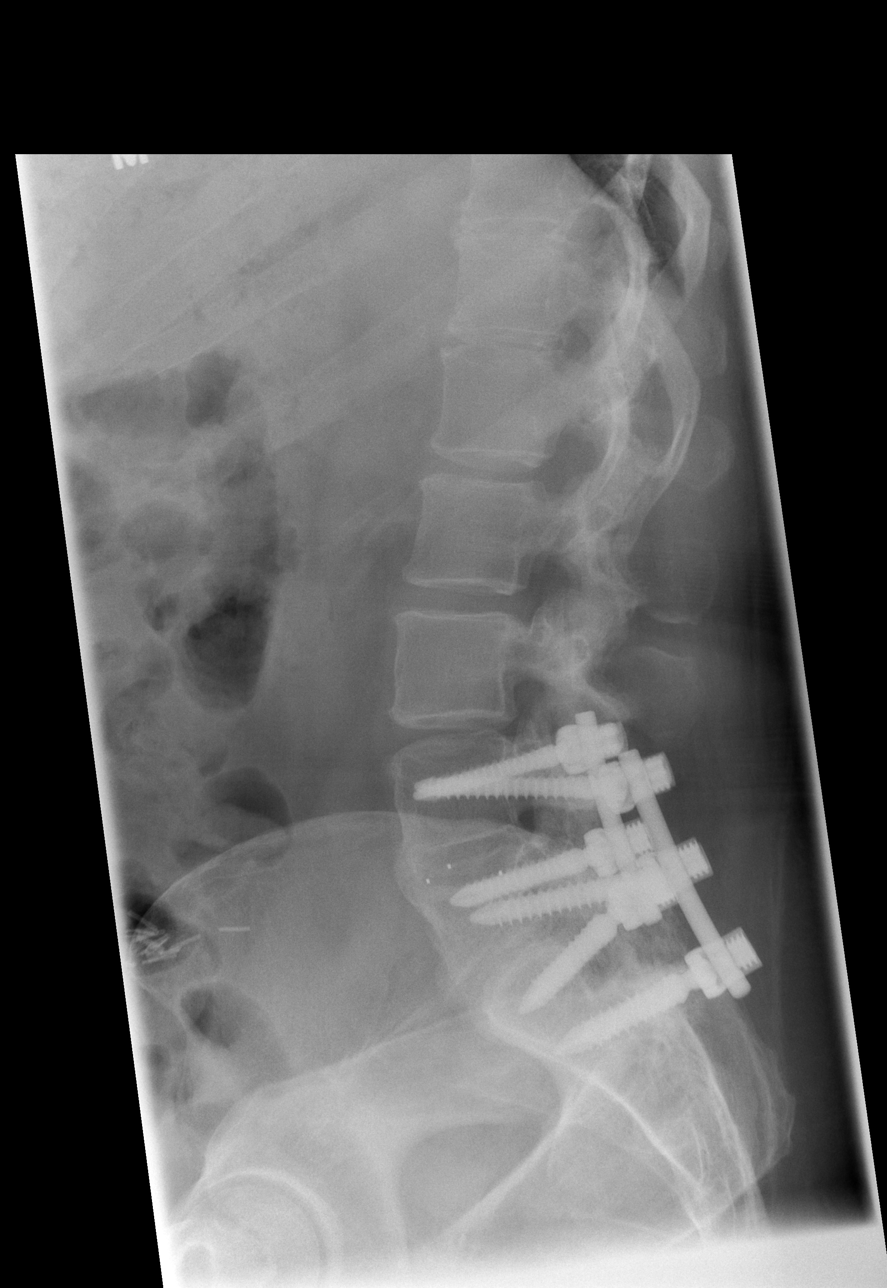

[t l-spine l5-s1 spot]
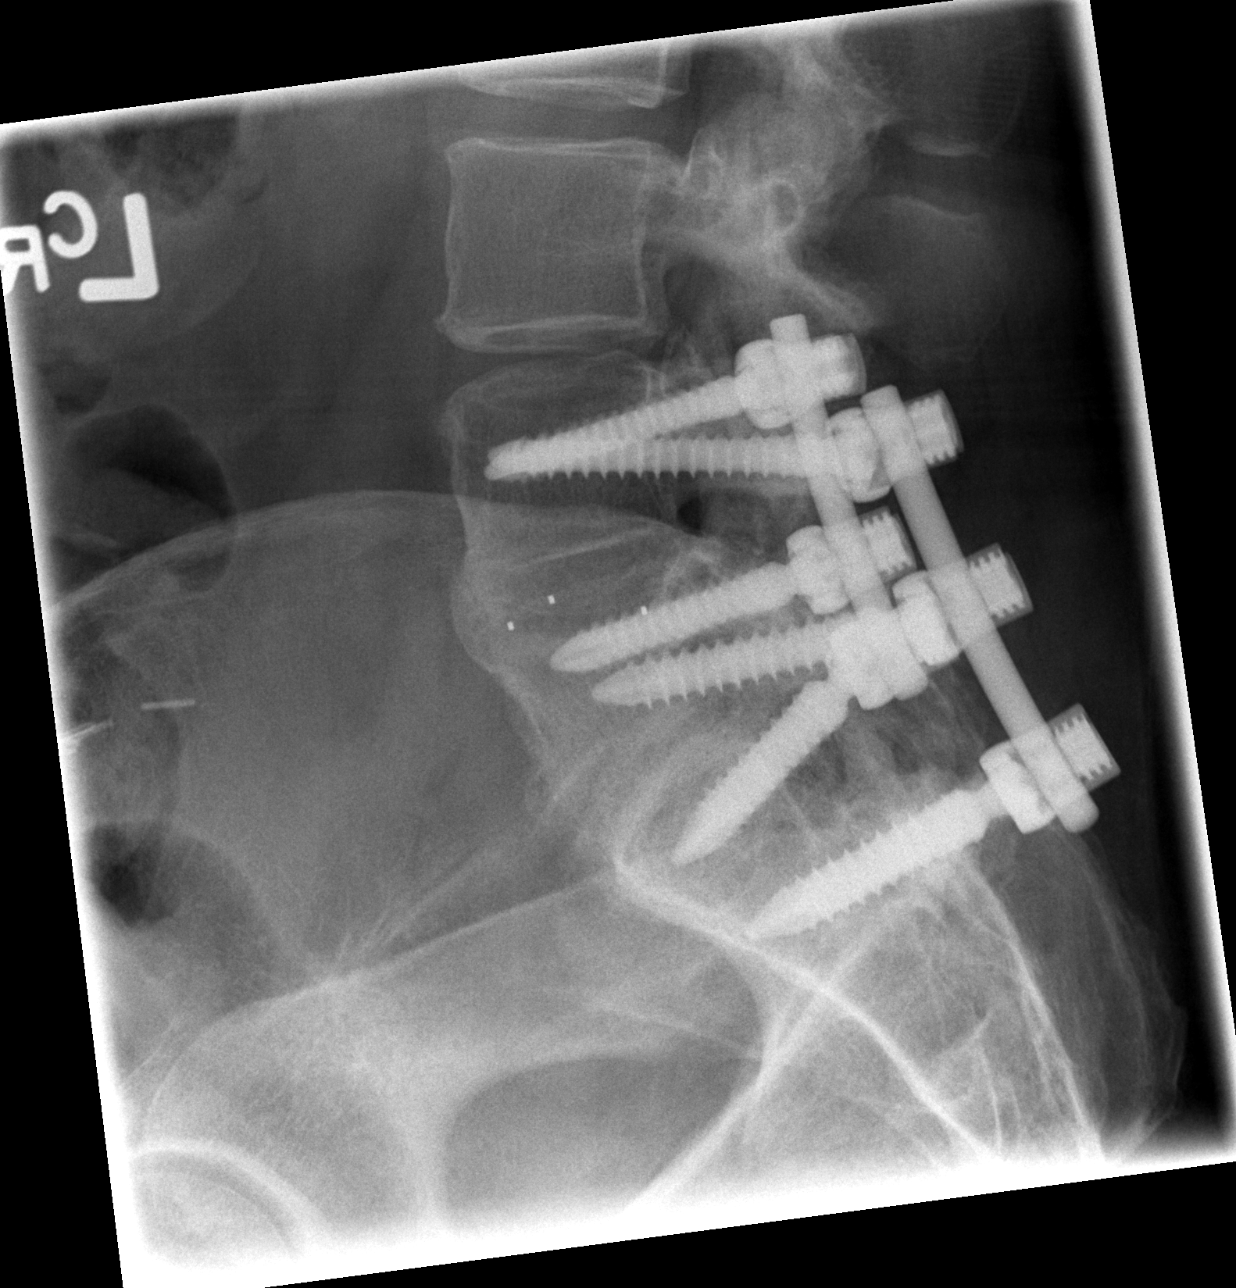

[5 of 5 positions shown; findings below may reference images not displayed]

FINDINGS: Five non-rib bearing lumbar vertebra with anatomic
alignment.  Interval apparent solid fusion from L4-S1.  Hardware
intact.  Mild disc space narrowing at L3-4, unchanged.
Degenerative changes involving the lower thoracic spine, unchanged.
No fractures.  Visualized sacroiliac joints intact.
IMPRESSION: No acute skeletal abnormalities.  L4-S1 PLIF with solid-appearing
fusion.  Mild degenerative disc disease and spondylosis at L3-4,
unchanged since October 2006.

## 2010-09-01 IMAGING — MR MR LUMBAR SPINE WO/W CM
5 of 8 series · 25 of 48 positions shown · IV contrast (multihance)
Comparison: Radiography [DATE].  MRI 03/04/2009.

CLINICAL DATA: Previous fusion.  Recent fall.  Urinary retention.

MRI LUMBAR SPINE WITHOUT AND WITH CONTRAST
TECHNIQUE: Multiplanar and multiecho pulse sequences of the lumbar
spine were obtained without and with intravenous contrast.
Contrast: 20 ml Multihance

[Series 3: T2 · sagittal · 4.0mm · 0.55mm/px · 3 of 12 slices shown (1 of 2)]
[im 1/12]
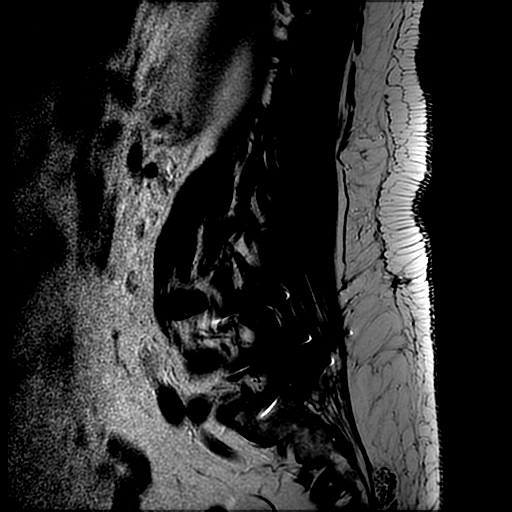
[im 6/12]
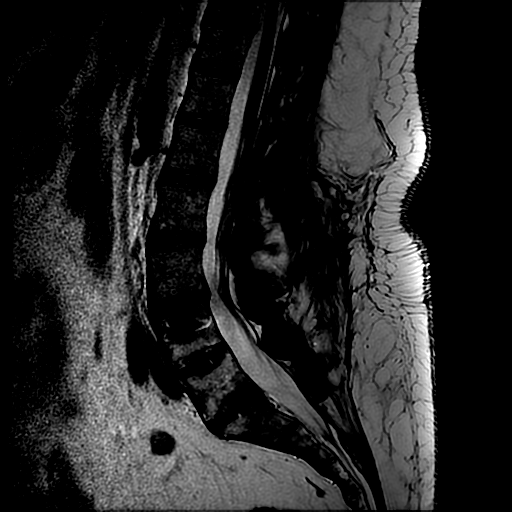
[im 12/12]
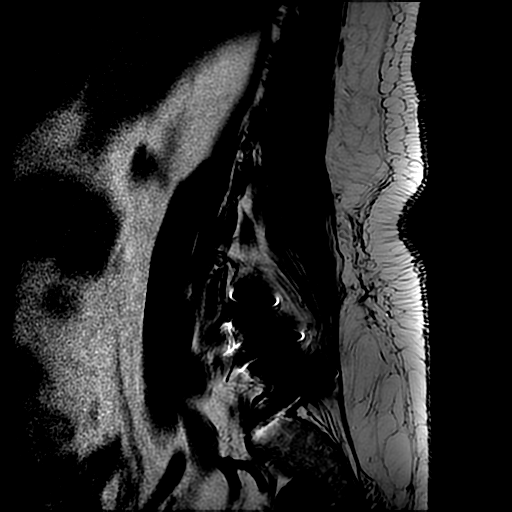

[Series 4: T1 · sagittal · 4.0mm · 0.55mm/px · 3 of 12 slices shown (1 of 2)]
[im 1/12]
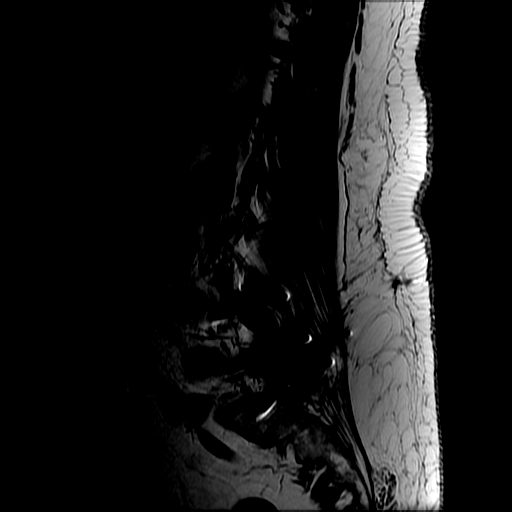
[im 6/12]
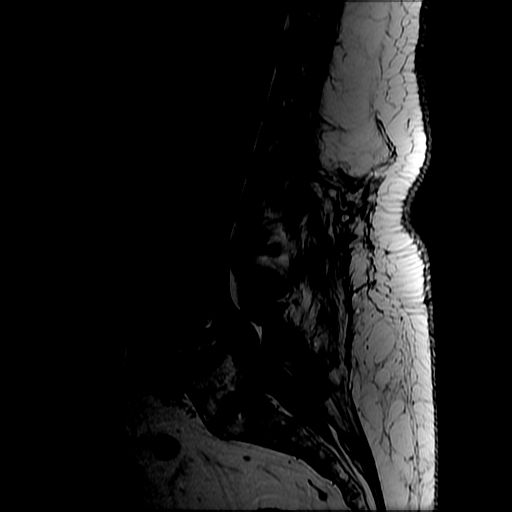
[im 12/12]
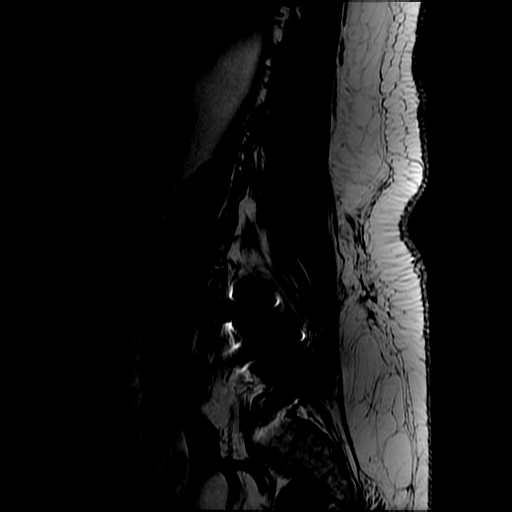

[Series 7: T2 · axial · 4.0mm · 0.39mm/px · z∈[-60,+202]mm · 8 of 30 slices shown (2 of 2)]
[im 1/30]
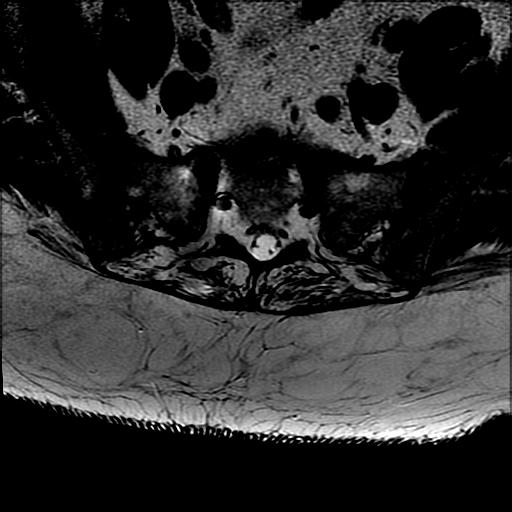
[im 4/30]
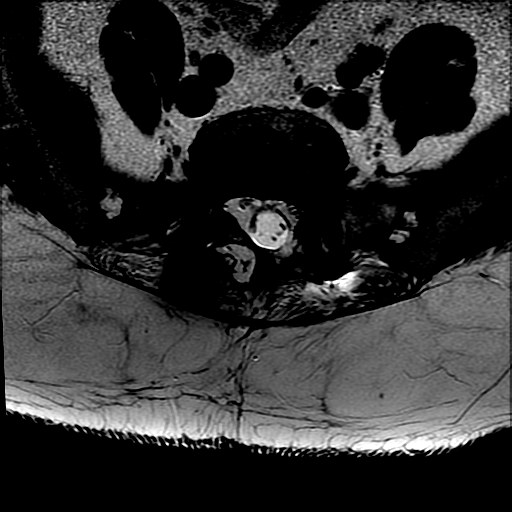
[im 10/30]
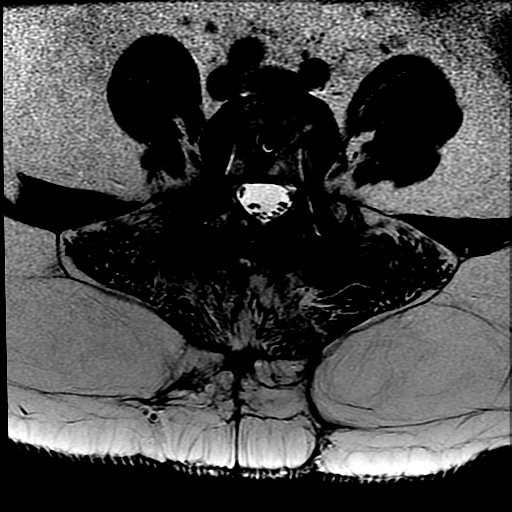
[im 13/30]
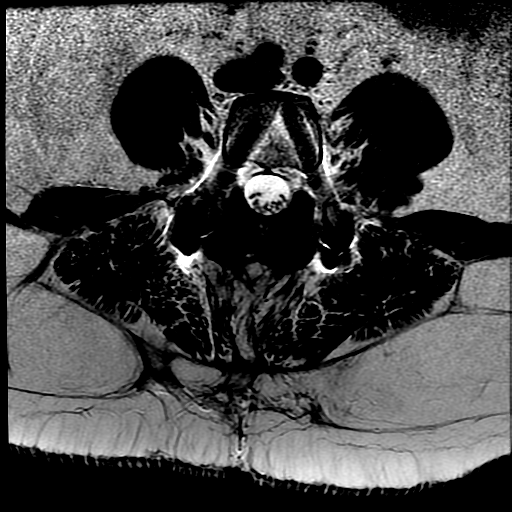
[im 17/30]
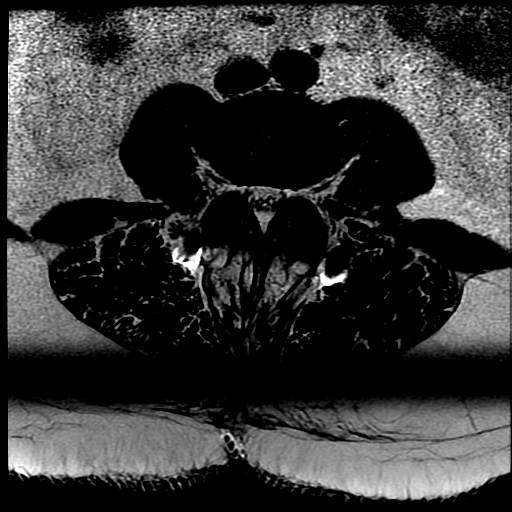
[im 20/30]
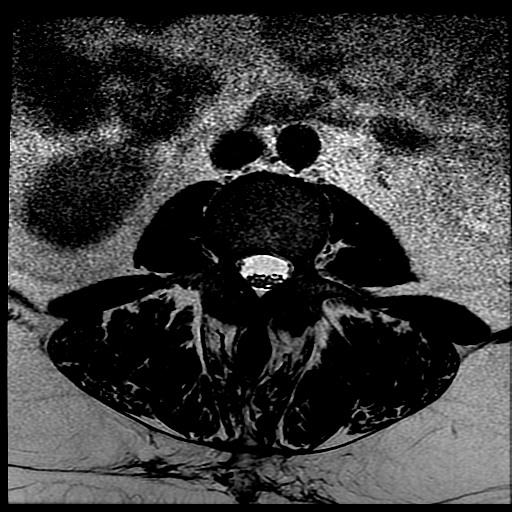
[im 26/30]
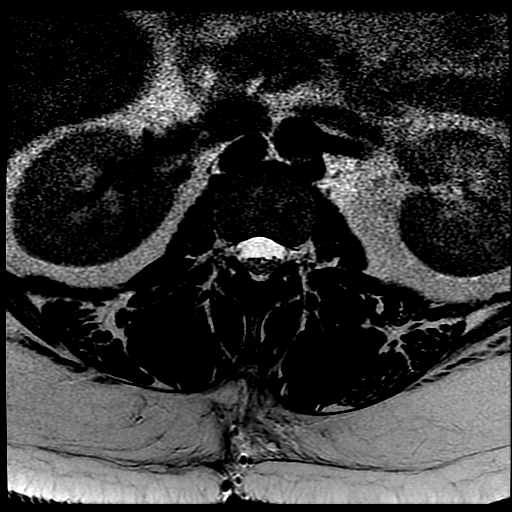
[im 30/30]
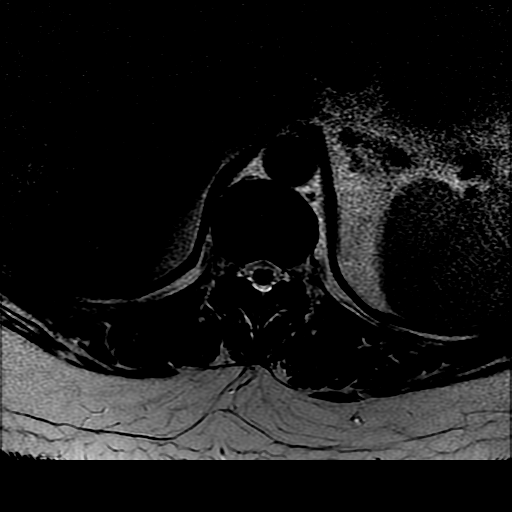

[Series 8: T1 · axial · 4.0mm · 0.78mm/px · z∈[-60,+201]mm · 8 of 30 slices shown (2 of 2)]
[im 1/30]
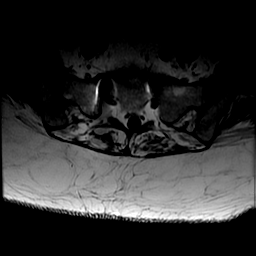
[im 4/30]
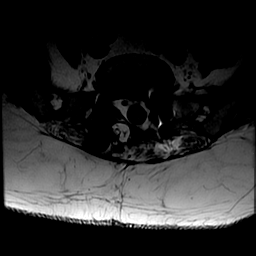
[im 10/30]
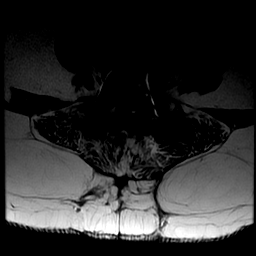
[im 13/30]
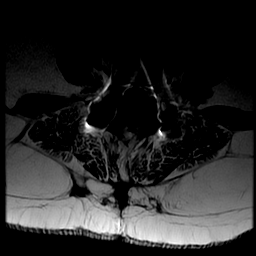
[im 17/30]
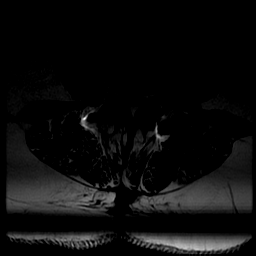
[im 20/30]
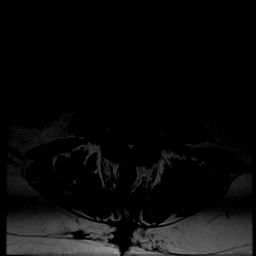
[im 26/30]
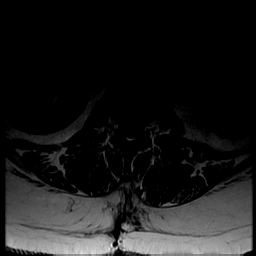
[im 30/30]
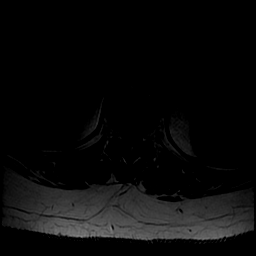

[Series 9: T1 post-contrast · sagittal · 4.0mm · 0.55mm/px · 3 of 12 slices shown]
[im 1/12]
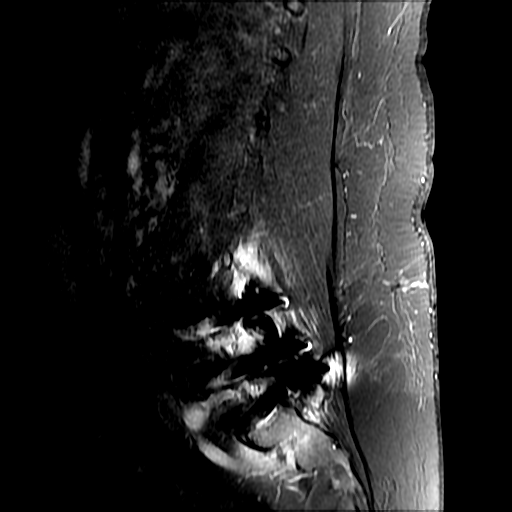
[im 4/12]
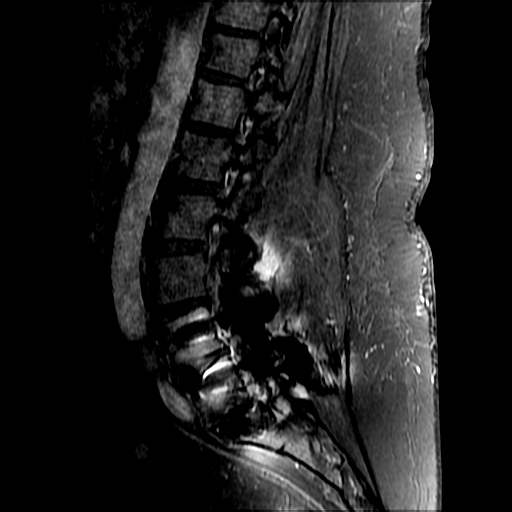
[im 8/12]
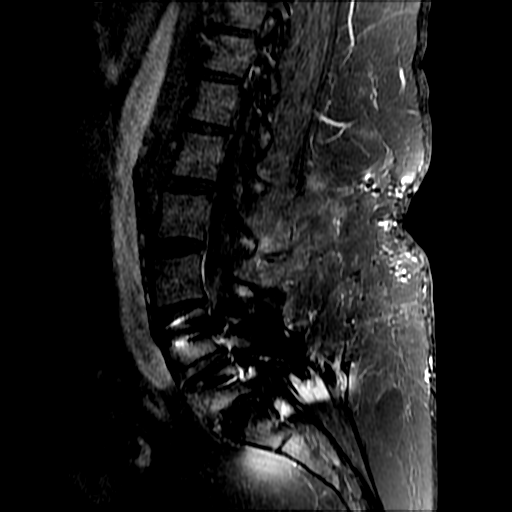

[25 of 48 positions shown; findings below may reference images not displayed]

FINDINGS: The scan covers the region from mid T 10 through S3.

There is no abnormality at L1-2 or above.  The discs are
unremarkable.  The canal and foramina are widely patent.  The
distal cord and conus are normal.  There is some ordinary facet
degeneration at T10-11 without encroachment upon the neural spaces.

At L2-3, the disc bulges mildly.  There is mild ligamentous
prominence.  No compressive stenosis.

At L3-4, the disc bulges mildly.  There is moderate facet and
ligamentous hypertrophy.  There is mild narrowing of the lateral
recesses without apparent neural compression.

Fusion segment from L4 to the sacrum appears widely patent.

These findings appear the same as were seen and February 2009.
IMPRESSION: No change since last [REDACTED].  No cause of the described symptoms
is identified.

Good appearance in the fusion segment from L4 to the sacrum.

Disc bulges at L2-3 and L3-4.  Mild facet and ligamentous
prominence.  No compressive stenosis however.

## 2010-09-01 NOTE — Consult Note (Signed)
NAMEMISHAAL, [name] [name]              ACCOUNT NO.:  [id number]   MEDICAL RECORD NO.:  [id number]          PATIENT TYPE:  INP   LOCATION:  2301                         FACILITY:  [facility]   PHYSICIAN:  Antonietta Breach, M.D.  DATE OF BIRTH:  07-08-1965   DATE OF CONSULTATION:  02/29/3008  DATE OF DISCHARGE:                                 CONSULTATION   CONTINUATION   PHYSICAL EXAMINATION:  Temperature 97.8, pulse 84, respiratory rate 19,  blood pressure 105/58, O2 saturation on 2 liters 96%.  GENERAL APPEARANCE:  [name] [name] is a young female lying in a supine  position in her hospital bed with no abnormal involuntary movements.   MENTAL STATUS EXAM:  [name] [name] is slightly sedated, however, she is  oriented to all spheres.  Her memory is intact to immediate recent and  remote except for the overdose blackout.  She has intermittent eye  contact.  Her attention span is slightly decreased, concentration  slightly decreased.  Affect is very constricted and there are tears  during the interview.  Mood is very depressed.  Fund of knowledge and  intelligence within normal limits.  Speech involves slightly flat  prosody.  No dysarthria.  Thought process logical, coherent, goal-  directed.  No looseness of associations.  Thought content:  Suicidal  intent.  She has no hallucinations or delusions.  Insight is partial.  Judgment is impaired.   ASSESSMENT:  AXIS I:  293.83 mood disorder not otherwise specified,  depressed, rule out 296.80 bipolar disorder not otherwise specified,  depressed.  Cocaine abuse versus dependence.  AXIS II:  Deferred.  AXIS III:  See past medical history.  AXIS IV:  Primary support group, general medical.  AXIS V:  30.   [name] [name] is still at risk to harm herself.   The undersigned provided ego supportive psychotherapy.   RECOMMENDATIONS:  1. Would defer primary psychotropic agents at this time.  2. Would utilize Ativan 0.5- 2 mg p.o. IM or IV q.6 h p.r.n.  anxiety      or agitation with caution about sedation as a side effect from the      Ativan.  3. Low-stimulation ego support.  4. Suicidal precautions.  5. Would admit to an inpatient psychiatric ward as soon as possible      for further evaluation and treatment.      Antonietta Breach, M.D.  Electronically Signed     JW/MEDQ  D:  03/01/2008  T:  03/01/2008  Job:  [id number]

## 2010-09-01 NOTE — Discharge Summary (Signed)
NAME:  [name] [name], [name] [name]           ACCOUNT NO.:  [id number]   MEDICAL RECORD NO.:  [id number]          PATIENT TYPE:  INP   LOCATION:  1319                         FACILITY:  [facility]   PHYSICIAN:  Madaline Savage, MD        DATE OF BIRTH:  1966-03-11   DATE OF ADMISSION:  10/26/2006  DATE OF DISCHARGE:  10/29/2006                               DISCHARGE SUMMARY   PRIMARY CARE PHYSICIAN:  Rudi Heap, M.D.   DISCHARGE DIAGNOSES:  1. Breakthrough seizures with subtherapeutic Depakote level.  2. Acute gastroenteritis.  3. Depression.  4. Gastroesophageal reflux disease.   DISCHARGE MEDICATIONS:  1. Effexor 300 mg at bedtime.  2. Trilafon 8 mg twice daily.  3. Depakote 1000 mg daily.  4. Aspirin 325 mg daily.  5. Cogentin 2 mg twice daily.  6. Multivitamin tablet daily.  7. Omeprazole 20 mg daily.  8. Neurontin 300 mg three times daily.  9. Zofran 4 mg daily as needed.   HISTORY OF PRESENT ILLNESS:  For full history and physical, see the  history and physical dictated by Dr. Darene Lamer on October 26, 2006. In short, Ms.  [name] [name] is 45 year old lady who apparently started having nausea and  vomiting over the last 2 to 3 days prior to admsision. She went to her  primary care physician, who put her on Zofran. On her way to the drug  store to get her medication, she had a tonic-clonic seizure and was  brought to the emergency room. Apparently, she was not able to keep  anything down and she has not been able to keep her medications down  including her Depakote. She had another episode of tonic-clonic seizure  in the emergency department, which was terminated with Ativan and was  admitted for further evaluation and management.   PROBLEM LIST:  1. Breakthrough seizures. [name] was on Depakote for a long      time and she was not taking her medications because of her nausea      and vomiting. This is probably withdrawal seizures. We checked the      Depakote level, which was  subtherapeutic at 29. We put her on      Zofran and Phenergan for her nausea and restarted her p.o.      medications. She has not had anymore seizures while she was at the      hospital.  2. Acute gastroenteritis. She had nausea and vomiting, which was      treated symptomatically with Phenergan and Zofran. At this point in      time, her nausea is under good control. We will discharge her home      with p.r.n. Zofran.  3. Depression. She will continue all of her mediations as before.   DISPOSITION:  She is now being discharged home in stable condition.   FOLLOWUP:  She is asked to followup with her primary care physician, Dr.  Rudi Heap, in 1 week.      Madaline Savage, MD  Electronically Signed     PKN/MEDQ  D:  10/29/2006  T:  10/30/2006  Job:  [id number]   cc:   Ernestina Penna, M.D.  Fax: [phone]

## 2010-09-01 NOTE — Discharge Summary (Signed)
[name] [name], [name] [name]           ACCOUNT NO.:  [id number]   MEDICAL RECORD NO.:  [id number]          PATIENT TYPE:  INP   LOCATION:  3703                         FACILITY:  [facility]   PHYSICIAN:  Ladell Pier, M.D.   DATE OF BIRTH:  [birth year]   DATE OF ADMISSION:  02/02/2007  DATE OF DISCHARGE:                               DISCHARGE SUMMARY   DATE OF DISCHARGE:  To be determined.   DISCHARGE DIAGNOSES:  1. Syncopal episode was negative syncopal workup.  2. Seizure disorder with continued breakthrough seizures versus      pseudoseizures, presently being worked up per neurology, Dr.      Orlin Hilding and Dr. Nash Shearer.  3. Obesity.  4. Depression.  5. Multiple psychiatric admissions.  6. History of gastroenteritis.  7. Gastroesophageal reflux disease.  8. Hyperlipidemia.  9. Hypertension.  10.Asthma.  11.History of spinal surgery in the past.  12.Bilateral knee surgery in the past.  13.Carpal tunnel syndrome.  14.Status post appendectomy.   DISCHARGE MEDICATIONS:  To be determined at the time of discharge.   FOLLOWUP:  Followup appointments to be determined at time of discharge.   CONSULTANTS:  1. Neurology, Gustavus Messing. Orlin Hilding, M.D., Estanislado Pandy, MD.  2. Psychiatry, Antonietta Breach, M.D.   PROCEDURES:  1. The patient had an EEG done that did not show any seizure activity.  2. MRI/MRA of the brain showed a minute mild signal loss of the distal      MCA and PCA branches, question of being artifactual.  No      significant proximal stenosis, aneurysm, or branch vessel      occlusion. MRI showed no acute intracranial abnormality.  3. Chest x-ray showed no active disease.  4. Head CT normal  5. A 2-D echocardiogram showed LVEF of 55%   HISTORY OF PRESENT ILLNESS:  The patient is a 45 year old white female  that presented to the emergency room complaining of syncopal episode x5.  She stated that she episodes where she feels that her heart is racing,  and then she gets  short of breath, and then she will pass out.  She does  have a history of seizure disorder.  Please see admission note for the  remainder of history, past medical history, family history, social  history, medications, allergies, Review of Systems per admission H&P.   PHYSICAL EXAMINATION ON DISCHARGE:  VITAL SIGNS:  Temperature 97.0,  pulse of 76, respirations 18, blood pressure 114/75, pulse oximetry 97%  on room air.  HEENT: Head is normocephalic, atraumatic.  Pupils reactive to light.  Throat without erythema.  CARDIOVASCULAR:  Regular rate and rhythm.  LUNGS:  Clear bilaterally.  ABDOMEN:  Positive bowel sounds.  EXTREMITIES:  No edema.   HOSPITAL COURSE:  #1.  SYNCOPAL EPISODE:  The patient had MRI/MRA that was normal.  No  events noted on telemetry.  A 2-D echocardiogram was normal. Cardiac  enzymes were normal.  Thus far, negative workup for syncopal episode.  She has not had another episode since  being admitted.   #2.  SEIZURE DISORDER:  The patient continued to have daily seizure  events while  in the hospital.  She had EGD done that was normal.  Her  medication was adjusted per neurology.  Depakote was increased as her  Depakote level was low on admission.  Effexor was tapered off since  Effexor does well as a seizure threshold.  She was started on Paxil.  Psychiatry was consulted to help with her medication adjustments and her  extensive psych history.   #3.  OBESITY:  Encouraged diet and exercise.   DISCHARGE LABORATORY DATA:  Valproic acid 70.3.  Sodium 136, potassium  3.8, chloride 101, CO2 30, BUN 75, BUN 5, creatinine 0.51, glucose 75.  WBC 6.8, hemoglobin 11.6, platelets 276.  Cardiac enzymes all normal.  TSH 1.912.  D-dimer normal at 0.24.      Ladell Pier, M.D.  Electronically Signed     NJ/MEDQ  D:  02/07/2007  T:  02/07/2007  Job:  [id number]

## 2010-09-01 NOTE — H&P (Signed)
NAME:  [name] [name]           ACCOUNT NO.:  [id number]   MEDICAL RECORD NO.:  [id number]          PATIENT TYPE:  EMS   LOCATION:  ED                           FACILITY:  [facility]   PHYSICIAN:  Madaline Savage, MD        DATE OF BIRTH:  07/18/1965   DATE OF ADMISSION:  10/26/2006  DATE OF DISCHARGE:                              HISTORY & PHYSICAL   PRIMARY CARE PHYSICIAN:  Ernestina Penna, M.D.   CHIEF COMPLAINT:  Nausea and vomiting, seizures.   HISTORY OF PRESENT ILLNESS:  [name] is a 45 year old female with  a history of seizures, reflux disease, who comes in with complaints of  seizures.  She was apparently having nausea and vomiting over the last  three days, which is progressively getting worse.  She vomits multiple  times a day and she now cannot keep anything down.  She went to her  primary care doctor's office today and he put her on Zofran and sent her  home.  He diagnosed her with acute gastroenteritis.  On her way home,  she developed a tonic, clonic seizure, which lasted a few minutes.  She  was diagnosed to be having a seizure disorder approximately a year ago  and she was brought to the emergency room.  In the emergency room, she  also had another episode of tonic, clonic seizures, which was controlled  with Ativan.  At this time, she is awake, alert.  She denies any other  complaints.   PAST MEDICAL HISTORY:  1. History of seizures.  2. Gastroesophageal reflux disease.  3. Hypertension.  4. Depression.   PAST SURGICAL HISTORY:  1. spinal surgery in the past.  2. Bilateral knee surgeries.  3. Appendectomy.   ALLERGIES:  She is allergic to TETRACYCLINE, MORPHINE, CODEINE and  PENICILLIN.   CURRENT MEDICATIONS:  1. She is on Effexor 300 mg at bedtime.  2. Trilafon 80 mg twice daily.  3. Depakote 1000 mg daily.  4. Aspirin 325 mg daily.  5. Cogentin 2 mg twice daily.  6. Multivitamin tablet daily.  7. Omeprazole 20 mg daily.  8. Neurontin 300 mg  three times daily.  9. Zofran 4 mg as needed, started today.   SOCIAL HISTORY:  She denies any history of smoking, takes alcohol  occasionally.  No history of drug abuse.   FAMILY HISTORY:  Both her parents are in their sixties.  They are  healthy.   REVIEW OF SYSTEMS:  General.  She denies any recent weight-loss, weight-  gain, no fever or chills.  HEENT:  No headaches, no blurred vision, no  sore throat.  CARDIOVASCULAR SYSTEM:  Denies chest pain, palpitations.  RESPIRATORY SYSTEM:  No shortness of breath, cough.  GI:  No abdominal  pain, but does have nausea.  No diarrhea or constipation.  EXTREMITIES:  No tingling or numbness of toes or fingers.   PHYSICAL EXAM:  She is alert and oriented times three.  VITAL SIGNS:  Temperature was 99, pulse rate of 81, blood pressure  123/88, oxygen saturation 96% on room air.  HEENT:  Head atraumatic, normocephalic.  Pupils are bilaterally equal  and reactive to light.  Mucous membranes moist.  NECK:  Supple, no JVD, no carotid bruit.  CARDIOVASCULAR EXAM:  S1, S2, heart had a regular rate and rhythm.  CHEST:  Clear to auscultation.  ABDOMEN:  Soft, bowel sounds heard.  EXTREMITIES:  No edema, cyanosis or clubbing.   LABORATORY DATA:  Show a white count of 6, hemoglobin 12.1, platelets of  291.  Sodium 140, potassium 4, chloride 110, bicarb 20, BUN 5,  creatinine 0.67, glucose of 98.  LFTs were within normal limits.  Urine  analysis was negative.   IMPRESSION:  1. Break-through seizures.  2. Acute gastroenteritis.  3. Depression.  4. Gastroesophageal reflux disease.   PLAN:  1. Break-through seizures:  This is a 45 year old lady, who has a      known history of seizure disorder, who is on Depakote.  She has      been having nausea and vomiting and so she has not been able to      keep anything down.  She has probably not been getting her Depakote      and she is probably subtherapeutic.  I will check a Depakote level      on her,  restart her home dose of Depakote.  She has gotten Ativan      while she was in the ED.  I will watch for any further seizures.  2. Acute gastroenteritis.  She does have nausea and vomiting.  We will      control the symptoms with Phenergan and Zofran at this point in      time.  I will also hydrate her with IV fluids.  3. Depression.  I will continue all her outpatient medications as      before.  4. Gastroesophageal reflux disease.  I will put her on a PPI.  5. I will put her on DVT prophylaxis.      Madaline Savage, MD  Electronically Signed     PKN/MEDQ  D:  10/26/2006  T:  10/26/2006  Job:  [id number]   cc:   Ernestina Penna, M.D.  Fax: [phone]

## 2010-09-01 NOTE — Discharge Summary (Signed)
NAME:  [name] [name], [name] [name]           ACCOUNT NO.:  [id number]   MEDICAL RECORD NO.:  [id number]          PATIENT TYPE:  IPS   LOCATION:  0302                          FACILITY:  [facility]   PHYSICIAN:  Jasmine Pang, M.D. DATE OF BIRTH:  [birth year]   DATE OF ADMISSION:  12/07/2006  DATE OF DISCHARGE:  12/15/2006                               DISCHARGE SUMMARY   IDENTIFYING INFORMATION:  A 45 year old divorced white female was  admitted on a voluntary basis on December 07, 2006.   HISTORY OF PRESENT ILLNESS:  The patient has a history of suicidal  ideation.  She stated if they did not help her she would hurt herself.  She had plans to overdose.  She reports compliance with her medication.  She reports a recent hospitalization due to gastroenteritis.  She states  she has been very depressed lately due to family problems.  Her oldest  son is in prison and the younger boys are having a lot of problems.  She  states they drag her into their problems.  She has had multiple overdose  attempts and began to feel suicidal this time.  The patient had ECT October 25, 2006, at [facility].  She was not compliant, apparently, with  continued treatment.  She has been seen at [facility] by Dr. Joni Reining.  She has a history of an overdose and she has cut  herself in the past.  Father has a history of bipolar disorder.  She has  back and knee pain.  She has a Port-a-Cath which was placed there due to  the ECT on October 25, 2006.   MEDICATIONS:  She is on numerous meds [facility]).   ALLERGIES:  1. CODEINE.  2. MORPHINE SULFATE.  3. PYRIDIUM.  4. IODINE.  5. TETRACYCLINE.   PHYSICAL FINDINGS:  Physical exam was done in the [facility] ED.  There  were no acute physical problems noted.  CBC was within normal limits.  BMET within normal limits except for a slightly decreased potassium of  3.4.  alcohol less than 5, TSH 2.217 which was normal.  UPT was  negative.  UDS  negative urine.   HOSPITAL COURSE:  Upon admission, patient was started on her home  medication of Depakote 1000 mg p.o. at bedtime; Neurontin 300 mg p.o.  t.i.d.; Effexor 300 mg p.o. at bedtime; Protonix 40 mg p.o. daily;  Trilafon 8 mg p.o. b.i.d.; Nabumetone 1000 mg p.o. daily; aspirin 325 mg  daily; Senokot 2 tablets p.o. daily; Cogentin 2 mg p.o. b.i.d. p.r.n.  EPS; and multivitamin 1 tab p.o. daily.  The patient was also started on  Ambien 10 mg p.o. at bedtime with a may-repeat x1.  She was nauseated  when she first came in and was started on Zofran 4 mg ODT q.6 h. p.r.n.  nausea and vomiting for 24 hours. On December 10, 2006, she was started on  Depakote ER 1250 mg p.o. at bedtime.  On December 15, 2006, she was  started on Phenergan 25 mg now, then 1 q.6 h. p.r.n.  nausea x24 hours.  Patient was quite depressed throughout most of the hospitalization.  She  remained suicidal.  She talked about her family problems.  She wanted to  try to start receiving ECT again but was not re-accepted back at [facility] for this.  She states she was at [facility] March through June of  2008.  She was supposed to go to assisted living but did not do this.  As hospitalization progressed, the patient continued to be depressed  with positive suicidal ideation.  She also was anxious.  There were no  auditory or visual hallucinations.  [facility] declined her for ECT due to  a history of noncompliance.  It was therefore decided that she would  transfer to [facility] for further evaluation and  disposition.  It was unclear whether they may be able to do some ECT  there, is possible, though they were concerned about her history of  pseudoseizures.   DISCHARGE DIAGNOSIS:  Axis I:  Mood disorder not otherwise specified.  Axis II:  None.  Axis III: Back pain, hypertension, gastroesophageal reflux disease,  history of pseudoseizures.  Axis IV: Severe (problems with primary support group, burden of   psychiatric illness, burden of medical problems, economic problem).  Axis V:  Global assessment of functioning upon discharge was 30.  Global  assessment of functioning upon admission was 30.  Global assessment of  functioning highest past year was 55.   DISCHARGE PLANS:  There were no specific activity level or dietary  restriction.   POST HOSPITAL CARE PLANS:  The patient will be transferred to [facility].   DISCHARGE MEDICATIONS:  1. Neurontin 300 mg p.o. t.i.d.  2. Effexor XR 300 mg p.o. nightly.  3. Protonix 40 mg daily.  4. Perphenazine 8 mg b.i.d.  5. Relafen 1000 mg daily.  6. Aspirin 325 mg daily.  7. Depakote tablets 1250 mg p.o. nightly.  8. Benzatropine 2 mg p.o. b.i.d. p.r.n.  9. Zolpidem 10 mg p.o. nightly p.r.n.      Jasmine Pang, M.D.  Electronically Signed     BHS/MEDQ  D:  12/15/2006  T:  12/16/2006  Job:  [id number]

## 2010-09-01 NOTE — Procedures (Signed)
EEG NUMBER:  [id number].   HISTORY:  This is a 45 year old with a history of seizure activity who  is having episodes of passing out.  The patient is having an EEG done to  evaluate for seizures.   PROCEDURE:  This is a routine EEG.   TECHNICAL DESCRIPTION:  Throughout this routine EEG, there is no  distinct or sustained posterior dominant rhythm noted.  The background  activity is mostly comprised of theta and delta range activity at 20-50  microvolts.  At times, there does seem to be slight asymmetry in the  background with more slowing noticed over the right hemisphere which at  times look sharply contoured.  At times, are also seems to be firda or  frontally intermittent rhythmic delta noted.  With photic stimulation,  there is mild symmetric photic driving response noted.  Hyperventilation  produces fairly symmetric slowing; however, at times there does seem  more prominent slowing noted over the right hemisphere.  The patient  does not go to sleep during this tracing.  Throughout this record, there  is no definitive epileptiform activity noted.  The EKG tracing shows a  heart rhythm of approximately 90 beats per minute.   IMPRESSION:  This routine EEG is abnormal secondary to diffuse slowing,  intermittent right hemispheric slowing adn FIRDA.  The diffuse slowing  is suggestive of a toxic metabolic or primary neuronal disorder.  The  intermittent sharply contoured right hemispheric slowing is suggestive  of an underlying structural lesion, possible cortical irritability.  FIRDA could also be seen in the deep midline structural lesions or  hydrocephalus.  Clinical correlation is advised.      Bevelyn Buckles. Nash Shearer, M.D.  Electronically Signed     WGN:FAOZ  D:  02/06/2007 14:18:19  T:  02/07/2007 10:59:00  Job #:  [id number]

## 2010-09-01 NOTE — Procedures (Signed)
EEG NUMBER:  [id number]   HISTORY:  This is a 45 year old with history of seizures who is having  continuous EEG monitoring done to evaluate for seizure activity.   PROCEDURE:  Continuous video EEG monitoring.   TECHNICAL DESCRIPTION:  Throughout this 16.8 hours of continuous EEG  monitoring, there are no push-button noted.  Background is fairly  symmetric, mostly compressive state and occasional delta range activity  at 20 to 40 microvolts.  At times, there does seem to be some slight  asymmetry in the background with intermittent right hemispheric  swelling.  Also noticed in the background are right hemispheric sharp  waves.  These seem more prominent during drowsiness and sleep, however,  are mostly prominent in the right frontal temporal head region at the  FAT-4 electrode.  There is no evidence of an electrographic seizure  during this recording.   IMPRESSION:  This continuous EEG monitoring performed from 02/06/07 to  02/07/07 had no push-buttons noted.  There was abnormality in the  background with diffuse slowing, intermittent right hemispheric  swelling, and right hemispheric sharp waves.  The diffuse swelling is  suggestive of a toxic metabolic or primary neuronal disorder.  The  intermittent right hemispheric swelling is suggestive of possible  underlying structural lesion.  The right hemispheric sharp waves, which  are most prominent during drowsiness and sleep, are suggestive of  cortical irritability and possible seizure focus.  Clinical correlation  is advised.  These results were discussed with Dr. Orlin Hilding, his primary  neurologist.      [name] [name]. Nash Shearer, M.D.  Electronically Signed     ZOX:WRUE  D:  02/07/2007 12:26:42  T:  02/08/2007 09:17:48  Job #:  [id number]

## 2010-09-01 NOTE — H&P (Signed)
[name] [name], [name] [name]           ACCOUNT NO.:  [id number]   MEDICAL RECORD NO.:  [id number]          PATIENT TYPE:  INP   LOCATION:  3703                         FACILITY:  [facility]   PHYSICIAN:  Ladell Pier, M.D.   DATE OF BIRTH:  Feb 17, 1966   DATE OF ADMISSION:  02/02/2007  DATE OF DISCHARGE:                              HISTORY & PHYSICAL   CHIEF COMPLAINT:  Syncopal episode.   HISTORY OF PRESENT ILLNESS:  The patient is a 45 year old white female  that presented to the emergency department complaining of a syncopal  episode times 5 over the past few months.  The patient stated that she  gets these episodes as if she feels as if her heart is racing, then she  get short of breath and then she will pass out.  It does not feel like  her regular seizures.  She loses consciousness but she is not confused  after the episodes.  She complains of chest pain sometimes and shortness  of breath.  She is not sure how long she is out for.  She has  diaphoresis. The pain is in the center of the chest and radiates over  towards the left.  No change with activity.  She had a syncopal episode  while she was out with friends today and they are the ones who  instigated calling 911 to get her to the hospital.   PAST MEDICAL HISTORY:  Significant for:  1. Multiple psych admissions.  2. She has a history of seizures.  3. History of gastroenteritis.  4. History of depression.  5. GERD.  6. Hyperlipidemia.  7. Hypertension.  8. Asthma.  9. History of spinal surgery in the past.  10.Bilateral knee surgery in the past.  11.Carpal tunnel syndrome.  12.Appendectomy.   FAMILY HISTORY:  Both parents are healthy.   SOCIAL HISTORY:  The patient is single, no children.  No alcohol or  tobacco use.  She is on disability.   MEDICATIONS:  1. She is on aspirin 81 mg daily.  2. Benztropine 1 mg q.h.s. p.r.n.  3. Depakote 500 mg twice daily.  4. Omeprazole 20 mg daily.  5. Florentine 8 mg twice  daily.  6. Senna 8.6 mg once daily.  7. Simvastatin 20 mg q.h.s.  8. Effexor 160 mg daily.  9. Ambien 5 mg daily p.r.n.   ALLERGIES:  CODEINE, MORPHINE AND TETRACYCLINE.   REVIEW OF SYSTEMS:  As per stated in HPI.   PHYSICAL EXAMINATION:  VITAL SIGNS:  Temperature 98.6, blood pressure  122/82, pulse 78, respirations 16, pulse oximetry 98% on room air.  HEENT:  Normocephalic, atraumatic.  Pupils reactive to light. No  erythema.  CARDIOVASCULAR:  Regular rate and rhythm.  LUNGS:  Clear bilaterally.  ABDOMEN:  Positive bowel sounds.  EXTREMITIES:  No edema.  NEUROLOGIC:  Nonfocal.   LABORATORY DATA:  EKG normal sinus rhythm.  Head CT negative.  Chest x-  ray negative.  WBC 5.9, hemoglobin 13.1, platelets 296, MCV 95.2, sodium  135, potassium 4.1, chloride 101, CO2 24, BUN 7, creatinine 0.59,  glucose 82.  Alcohol level less than 5, CK  36.7, MB less than 1.0,  troponin less than 0.05.  UA negative.  D-dimer 0.24.   ASSESSMENT/PLAN:  1. Syncopal episode, question etiology.  All her labs are normal.      Head CT is normal.  We will admit her to the hospital.  We will      rule out with serial cardiac enzymes, monitor on the heart monitor.      We will check MRI/MRA 2D echocardiogram.  If all is negative, she      will follow up with primary care physician outpatient for further      workup.  2. Depression.  We will continue he on her outpatient medications.      Ladell Pier, M.D.  Electronically Signed     NJ/MEDQ  D:  02/02/2007  T:  02/03/2007  Job:  [id number]   cc:   Ernestina Penna, M.D.

## 2010-09-01 NOTE — Consult Note (Signed)
NAMEVIANEY, [name]           ACCOUNT NO.:  [id number]   MEDICAL RECORD NO.:  [id number]          PATIENT TYPE:  INP   LOCATION:  3703                         FACILITY:  [facility]   PHYSICIAN:  Antonietta Breach, M.D.  DATE OF BIRTH:  1965/08/08   DATE OF CONSULTATION:  02/03/2007  DATE OF DISCHARGE:  02/07/2007                                 CONSULTATION   REQUESTING PHYSICIAN:  [facility] [employer] [facility].   REASON FOR CONSULTATION:  Side effects of psychotropic medications,  bipolar disorder.   HISTORY OF PRESENT ILLNESS:  [name] is a 45 year old  female admitted to the [facility] on October 16 due to a syncopal  episode.   The patient was admitted complaining of syncopal episodes, including  passing out, having about 5 over the past 8 weeks.  She has been having  recurrent tonic-clonic seizure on the general medical ward. Neurology  has been consulted.   The patient is not having any hallucinations or delusions.  She is not  having any racing thoughts.  Her energy is decreased; however,  concentration is slightly decreased.  She is not having any thoughts of  harming herself or others.  She is cooperative with bedside care.   The patient has been maintained on her mood stabilizer Depakote at 500  mg q.a.m. and 1000 mg nightly.  She has also been on venlafaxine 150 mg  daily for antidepression.  There is concern that the venlafaxine may be  lowering the seizure threshold.   PAST PSYCHIATRIC HISTORY:  The patient was recently admitted to the [facility] and August of this year due to depression and  suicidal ideation.  She had plans to overdose at that time.   The patient has had a history of multiple psychotropic trials.  She has  also had electroconvulsive therapy at [facility] in July of this  year.   The patient has a history of multiple suicide attempts, as well as  cutting herself.   She was discharged from [facility] in August on  Neurontin 300 mg t.i.d., Effexor XR 300 mg daily, Trilafon 8 mg b.i.d.,  Depakote 1250 mg daily, Cogentin 2 mg b.i.d. p.r.n., Ambien 10 mg  nightly p.r.n.   On review of the past medical record bipolar disorder is listed in the  diagnoses, also post-traumatic stress disorder.   In February 2005, the past medical record reports an episode where the  patient was at church and dancing around and then suddenly collapsed.  She was not sleeping at all for approximately 3 or 4 days prior to  psychiatric admission.  She was also having some racing thoughts and  feeling overly active and having lots of energy.  That period had been  proceeded with severe depression.   FAMILY PSYCHIATRIC HISTORY:  Reportedly, the father has bipolar  disorder.   SOCIAL HISTORY:  The patient has 3 children.  Her older son was  reportedly in jail recently.  The patient has been living with her  mother.  She is divorced.  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  Religion  is [employer] and [employer].  Occupation:  Disabled.   PAST MEDICAL HISTORY:  The patient has a seizure disorder, history of  gastroenteritis, gastroesophageal reflux disease, hyperlipidemia,  hypertension, asthma, history of appendectomy, carpal tunnel syndrome,  bilateral knee surgery in the past, history of spinal surgery.   MEDICATIONS:  The MAR is reviewed.  The patient is on Effexor 150 mg  daily, Depakote 500 mg q.a.m. 1000 mg nightly.   ALLERGIES:  CODEINE, MORPHINE SULFATE, PYRIDIUM, IODINE, TETRACYCLINE,  PENICILLIN.   TSH within normal limits.  Depakote level was 41 today.  Urine drug  screen was negative.  Comprehensive metabolic panel was unremarkable.  The patient's SGOT 18, SGPT 10.  Alcohol negative.  WBC 5.9, hemoglobin  13.1, platelet count 296.   EEG is pending.   MR of the brain with and without  contrast showed no acute intracranial  abnormality or focal lesion to explain a seizure.  A head CT without  contrast was negative.   REVIEW OF SYSTEMS:  CONSTITUTIONAL:  Afebrile.  No weight loss.  HEAD:  No trauma.  EYES:  No visual changes.  EARS:  No hearing impairment.  NOSE:  No rhinorrhea.  MOUTH/THROAT:  No sore throat. NEUROLOGIC:  No  focal motor or sensory changes.  PSYCHIATRIC:  As above.  CARDIOVASCULAR:  No chest pain, palpitations.  RESPIRATORY:  No coughing  or wheezing.  GASTROINTESTINAL:  No nausea, vomiting, diarrhea.  GENITOURINARY:  No dysuria.  SKIN:  Unremarkable.  ENDOCRINE/METABOLIC:  No heat or cold intolerance.  MUSCULOSKELETAL:  No deformities.  HEMATOLOGIC/LYMPHATIC:  Unremarkable.   EXAMINATION:  VITAL SIGNS:  Temperature 97.8, pulse 98, respiratory rate  20, blood pressure 128/83, O2 saturation on room air 94%.  GENERAL APPEARANCE:  [name] is a middle-aged female lying in a  right lateral decubitus position in her hospital bed.  She has no  abnormal involuntary movements.  OTHER MENTAL STATUS EXAM:  [name] is slightly stuporous.  Her  attention span is mildly decreased.  Her eye contact is good.  She is  oriented to all spheres.  Her memory is grossly intact except for the  blackout periods.  She has a normal fund of knowledge and intelligence.  Speech is slightly soft with a slightly flat prosody without dysarthria.  Thought process is coherent.  No looseness of associations.  Language  expression and comprehension are intact.  Thought content:  No thoughts  of harming herself, no thoughts of harming others, no delusions, no  hallucinations.  Insight is good.  Judgment is intact.  Affect is flat.  Mood is slightly anxious.   ASSESSMENT:  AXIS I:  1. 293.83.  Mood disorder not otherwise specified, depressed (general      medical and functional history).  The depression symptoms at this      time appear to be limited to concentration and  energy being      decreased which are secondary to her general medical factors.  2. 296.80.  Rule out bipolar disorder not otherwise specified.  3. 293.84.  Anxiety disorder not otherwise specified, rule out post-      traumatic stress disorder.  AXIS II:  Deferred.  AXIS III:  See general medical section above.  AXIS IV:  General medical.  AXIS V:  50.   RECOMMENDATIONS:  If the seizures continue and neurology concurs, would  taper off  of Effexor while starting Paxil over 2 weeks; would make the  target dose of Paxil 20 mg daily.   DISCUSSION:  The norepinephrine reuptake inhibition of Effexor could  theoretically lower the seizure threshold.  Effexor also has dopamine  reuptake inhibition at the dosages that the patient was receiving over  the summer (300 mg daily) and increased intrasynaptic dopamine could  also reduce the seizure threshold.   Paxil, on the other hand, is more of a selective serotonin reuptake  inhibitor and keeping the SRI component is particularly important in  patients with PTSD along with depression.   RECOMMENDATIONS:  1. Another option for neurology to consider is whether Trilafon would      need to be tapered off as a possible contributor to the decreased      seizure threshold.  There is scarcity in the literature regarding      which antipsychotics can be more seizure-inducing.  There is some      support that Moban may have a lower seizure risk.  Also, Prolixin      may have a lower seizure risk.  Moban would be a better choice in a      patient who can take p.o. given that it has a lower EPS risk than      Prolixin.  2. Psychiatric disposition will depend upon the patient's general      medical progress.  Outpatient psychiatric followup can be obtained      at one of the clinics attached to [facility], [facility]      or [facility].  3. Would continue to check periodic CBC and liver function panel for      Depakote adverse-effect  screening.  If the patient develops any      delirium syndrome, would check an ammonia level.  Depakote can      cause an idiosyncratic elevation in ammonia independent of Depakote-      induced hepatitis.      Antonietta Breach, M.D.  Electronically Signed     JW/MEDQ  D:  02/07/2007  T:  02/08/2007  Job:  [id number]

## 2010-09-01 NOTE — Consult Note (Signed)
NAME:  [name] [name], [name] [name]           ACCOUNT NO.:  [id number]   MEDICAL RECORD NO.:  [id number]          PATIENT TYPE:  INP   LOCATION:  3703                         FACILITY:  [facility]   PHYSICIAN:  Gustavus Messing. Orlin Hilding, M.D.DATE OF BIRTH:  [birth year]   DATE OF CONSULTATION:  02/03/2007  DATE OF DISCHARGE:                                 CONSULTATION   REFERRING SERVICE:  [facility].   REASON FOR CONSULTATION:  Seizure, syncope.   HISTORY OF PRESENT ILLNESS:  [name] is a 45 year old woman who  is a patient of Dr. Clarisa Kindred with a history of presumed seizure disorder  with intractable seizures, or uncontrolled seizures at any rate, and  severe bipolar disorder.  She has been getting electroconvulsive therapy  for her psychiatric disorder and has been having frequent seizures, at  least 1 a week for the last year and half and frequently has little  flurries of seizures.  She was last seen by Dr. Anne Hahn on June 14, 2006.  He felt that the ECT may be contraindicated with an uncontrolled  seizure disorder and thought she should hold off on that.  He increased  her Lamictal from 50-100 in the evening and then wanted to go to 100  twice a day.  However, in March, there is a phone noted that she  attempted suicide with the Lamictal, so her anticonvulsant was changed  to Depakote.  Since that time, as far as I can tell, she has not ever  had significantly therapeutic level; she has only had 1 level of  Depakote over the threshold of 50; everything else has been below that  and she had frequent ER visits with seizures.  It is unclear to me why  she has not returned to see Dr. Anne Hahn.  The patient herself is a little  bit somnolent and lethargic and is not able to tell me why she has not  gone back to see Dr. Anne Hahn.  At any rate, she apparently had a flurry  of seizures over the weekend and was admitted for evaluation.  The  episodes start with her having a subjective sensation of  heart  palpitations and hyperventilation followed by 2- to 10-minute loss of  consciousness with a slow collapse with her knees buckling and then when  she is on the ground, she has rhythmic jerking of her extremity, all 4  of them.  These are usually witnessed by family and they notice that her  legs buckle under her, but she never hits her head.  There is a  predictable 15- to 20-minute period following episodes where she appears  disoriented and confused.  She has bitten her tongue or cheek, but has  never had incontinence.  She has a had normal EEG in the past, an EEG  which has shown some slowing, but no seizure activity, and normal MRI in  the past.   REVIEW OF SYSTEMS:  Thirteen-system review is positive for many things  including fatigue, prolonged sleep, decreased appetite, frequent  headaches, chest pain, back pain, memory impairment, blurry vision, etc.   PAST MEDICAL HISTORY:  1. Hypertension.  2. Hypercholesterolemia.  3. Migraines.  4. Bipolar disorder.  5. Obesity.  6. Lumbosacral spine surgery.  7. Chronic back pain.  8. Bilateral knee surgery.  9. Carpal tunnel syndrome.  10.Bilateral plantar fasciitis.  11.Remote appendectomy.  12.Remote hysterectomy.  13.Peripheral neuropathy.  14.Tennis elbow on the left.  15.These seizure-like events.  16.She has also had suicide attempt associated with her depression and      bipolar disorder.   MEDICATIONS:  1. She was given a load of Dilantin and put on a maintenance Dilantin      dose while she was in the emergency room last night.  2. She was put on Lovenox.  3. She is on aspirin 81 mg a day.  4. Protonix 40 mg a day.  5. Senokot.  6. Cogentin 1 mg nightly.  7. Depakote 500 ER b.i.d., although she is subtherapeutic with a level      of 41 on that.  8. Zocor 20 mg daily.  9. Effexor daily, although there is some concern of that should be      stopped.  10.Trilafon b.i.d.  11.Phenergan 25 p.r.n.  12.Zofran 4  mg IV p.r.n.  13.Ambien 5 mg p.r.n.  14.Tylenol, oxycodone and hydromorphone are all p.r.n.   ALLERGIES:  CODEINE, MORPHINE, PYRIDIUM, IODINE and TETRACYCLINE. Of  note, she has a prior history of cocaine abuse.   SOCIAL HISTORY:  Prior cocaine abuse, none recently.  She lives in  [place] with her fiance, 2 sons and 1 of her son's wife.  She has never  smoked and does not currently drink alcohol.   FAMILY HISTORY:  Negative for seizure.  Mother has hypertension.  Father  has coronary artery disease.   OBJECTIVE:  VITAL SIGNS:  On exam, temperature is 98.2, pulse 77,  respirations 20, BP 115/76, saturation 96% on room air.  GENERAL:  She is difficult to arouse.  She was seen by a medical student  approximately 45 minutes before and although somewhat sleepy, was much  more cooperative and arousable; however, nobody in the room reported  noticing any seizure activity.  Nursing staff do not report being aware  of any seizure activity.  NEUROLOGIC:  I can arouse her with deep pain and she will follow  commands and her speech is mildly slurred due to decreased effort, but  she is not truly dysarthric.  When she has aroused, visual fields are  full to confrontation, extraocular movements are intact, facial  sensation is normal, facial motor activity is normal, but she makes poor  effort.  Hearing is intact.  Palate is symmetric.  Tongue is midline  without lacerations. On her neurologic exam otherwise, she was oriented  for the medical student, but really not cooperative with following  questions with me when I was examining her.  Motor Exam:  She is giving  poor effort throughout, but there is no clear asymmetry.  Reflexes are  2+ and symmetric with downgoing toes.  Coordination:  Finger-to-nose and  heel-to-shin are normal.  Sensory is patchy and variable.  I did not get  out of bed to walk her.  MRI of her brain is normal.  A valproic acid  level as noted was low at 41.  Drug screen  was negative.   ASSESSMENT:  History of multiple seizure-like events; the question is  seizure disorder versus seizure disorder and pseudoseizures versus all  pseudoseizures.  She does have a severe bipolar and depressive disorder  with suicide attempts and electroconvulsive  therapy.  She is seen by Dr.  Anne Hahn.  She is subtherapeutic on the Depakote.   RECOMMENDATIONS:  At this point, I would increase her Depakote to 1500 a  day and discontinue the Dilantin.  If she has further events over the  weekend, we can try to arrange prolonged video EEG monitoring that  cannot be done on the weekend.  Unfortunately, the unit is going to be  used next week for a scheduled admission, so we would not be able to do  prolonged inpatient video EEG monitoring until after the 27th.  However,  if she is ready for discharge at any time, she can have an ambulatory  outpatient EEG.  I would not make further adjustments in her seizure  medicine until the question of whether these are epileptic seizures or  not is better established.      Catherine A. Orlin Hilding, M.D.  Electronically Signed     CAW/MEDQ  D:  02/03/2007  T:  02/06/2007  Job:  [id number]

## 2010-09-01 NOTE — H&P (Signed)
[name] [name], [name] [name]              ACCOUNT NO.:  [id number]   MEDICAL RECORD NO.:  [id number]          PATIENT TYPE:  INP   LOCATION:  2301                         FACILITY:  [facility]   PHYSICIAN:  Lonia Blood, M.D.       DATE OF BIRTH:  December 26, 1965   DATE OF ADMISSION:  02/29/2008  DATE OF DISCHARGE:                              HISTORY & PHYSICAL   PRIMARY CARE PHYSICIAN:  [employer].   CHIEF COMPLAINT:  Altered mental status.   HISTORY OF PRESENT ILLNESS:  [name] [name] is a 45 year old woman with  history of bipolar disorder, depression, suicide attempts, who was  brought in tonight from home after she ingested an unspecified amount of  Ambien, Geodon and Depakote in a suicide attempt.  The history is  obtained from the emergency room records as well as the old records  because the patient is currently comatose.  According to the records  from the emergency room, the patient was depressed for months and then  she took these medicines in an attempt to end her life.  The husband  noticed that she was very somnolent and he called 911 and she was  brought to the ED.   PAST MEDICAL HISTORY:  Depression, bipolar disorder, asthma,  hypertension, seizures, gastroesophageal reflux disease, hyperlipidemia,  obesity, posttraumatic stress disorder.   HOME MEDICATIONS:  1. Depakote.  2. Geodon.  3. Protonix.  4. Ambien.   ALLERGIES:  CODEINE, MORPHINE, PENICILLIN, TETRACYCLINE.   SOCIAL HISTORY:  According to the old records, the patient is married,  does not smoke cigarettes, does not drink alcohol, and she is on  disability.   FAMILY HISTORY:  The patient's mother has hypertension and diabetes.  Father has COPD.   REVIEW OF SYSTEMS:  Unobtainable.   PHYSICAL EXAMINATION:  VITAL SIGNS:  Upon admission, temperature is  97.1, blood pressure 127/80, pulse 89, respirations 12, saturation 100%  on 2 liters of oxygen.  GENERAL:  The patient is comatose.  Will not answer  commands.  Will  retract to pain.  HEENT:  Her eyes are in the midline with pupils of 2 mm, symmetric, and  reactive to light.  Throat seems clear.  NECK:  Thick and short.  CHEST:  Clear bilaterally without wheezes, rhonchi, or crackles.  HEART:  Regular rate and rhythm without murmurs, rubs, or gallops.  ABDOMEN:  Soft, obese, nontender.  Bowel sounds are present.  EXTREMITIES:  Lower extremity without edema.  SKIN:  Warm and dry without any suspicious rashes.   LABORATORY VALUES:  On admission, EKG has been reviewed and shows sinus  tachycardia, QT prolongation at .  Sodium is 138, potassium 3.3,  chloride 101, bicarbonate 24, BUN 3, creatinine 0.7, albumin 3.4.  Salicylate less than 4.  Alcohol less than 5.  Tylenol less than 10 and  valproic acid is 125.  Chest x-ray shows low lung volumes.   ASSESSMENT AND PLAN:  This is a 45 year old woman with severe depression  who is getting admitted for closer monitoring and specialized care after  a suicide attempt.  She is currently comatose  most likely from the  additive effect of Depakote, Ambien, Geodon as central nervous system  depressants.  We will proceed with supportive care by using intravenous  fluids, vitamins, and careful vitals and airway monitoring in the  intensive care unit.  Once the patient wakes up, she will require  psychiatric evaluation.      Lonia Blood, M.D.  Electronically Signed     SL/MEDQ  D:  02/29/2008  T:  02/29/2008  Job:  [id number]   cc:   Olena Leatherwood [facility]
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
## 2010-09-01 NOTE — Consult Note (Signed)
[name] [name], [name] [name] NO.:  [id number]   MEDICAL RECORD NO.:  [id number]          PATIENT TYPE:  INP   LOCATION:  2924                         FACILITY:  [facility]   PHYSICIAN:  Anselm Jungling, MD  DATE OF BIRTH:  April 11, 1966   DATE OF CONSULTATION:  DATE OF DISCHARGE:                                 CONSULTATION   IDENTIFYING DATA/REASON FOR REFERRAL:  The patient is a 45 year old  Caucasian female with a history of bipolar disorder currently a client  at [facility].  She is being treated here at [facility] in the aftermath of a suicide attempt by polydrug  overdose.  Psychiatric consultation is requested to assess mental status  and make recommendations.  She is currently under the care of Dr.  Roxan Hockey who I have spoken with.   HISTORY OF PRESENTING PROBLEM:  The patient has a history of multiple  prior inpatient psychiatric hospitalizations and multiple prior suicide  attempts.  She was hospitalized here at [facility] in October 2008 for  syncope and seizures versus pseudoseizures, and at that time was  evaluated by psychiatric consultant, Dr. Antonietta Breach.  Prior to that  in August of 2008 she was admitted to our inpatient psychiatric service  after an episode of increased depression and suicidal ideation.  One  month prior to that in July 2008 she was treated with electroconvulsive  therapy at [facility] inpatient psychiatry.   On the eve of this admission the patient took an overdose of many  psychotropic medications including Seroquel, trazodone, Ambien, Effexor,  Depakote, and Neurontin.  She called 911 herself and was brought to the  emergency department.  She has subsequently been admitted.   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  The patient has had various brain imaging and  neurological consultation in the past.  Other medical problems include  asthma, hypertension, GERD,  obesity, and frequent headache.   MENTAL STATUS AND OBSERVATIONS:  I came to see the patient in her  hospital room.  She has a Comptroller nearby.  She was sleeping when I  arrived.  When I spoke her name several times, she woke briefly with a  start.  She was extremely lethargic, sleepy, and looked uncomfortable  and dysphoric as well.  She was able to acknowledge my presence, and  stated that she is still sick.  She was able to acknowledge the fact  that she had made a suicide attempt.  I explained to her that I was from  the psychiatry department, and that we would be thinking of transfer to  the inpatient psychiatric service where she had been last year following  her medical stabilization here.  She nodded in agreement with this, but  made no other comment.   It appeared that this was not the most opportune time to try to  interview the patient more extensively about her recent history and  symptoms.  I told the patient that I would return tomorrow for further  discussion about her overall situation, treatment needs, and the  possibility  of transfer to inpatient psychiatry.   IMPRESSION:  AXIS I:  History of bipolar disorder most recently  depressed without psychotic features.  AXIS II:  Deferred.  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  AXIS IV:  Stressors severe.  AXIS V:  GAF 35.   RECOMMENDATIONS:  I am in agreement with her current management.  I  would recommend continuing the sitter at this time.  I will return  tomorrow, June 30, 2007, to assist the patient again and explore the  possibility of transfer to the inpatient psychiatric service for further  care provided that she is medically cleared at that time.  Dr. Geralyn Flash, cell phone [phone].      Anselm Jungling, MD  Electronically Signed     SPB/MEDQ  D:  06/29/2007  T:  06/29/2007  Job:  [id number]

## 2010-09-01 NOTE — Discharge Summary (Signed)
NAMEWELDA, [name]              ACCOUNT NO.:  [id number]   MEDICAL RECORD NO.:  [id number]          PATIENT TYPE:  IPS   LOCATION:  0400                          [facility]AC[facility]  [facility]   PHYSICIAN:  Anselm Jungling, MD  DATE OF BIRTH:  03/22/1966   DATE OF ADMISSION:  03/01/2008  DATE OF DISCHARGE:  03/07/2008                               DISCHARGE SUMMARY   IDENTIFYING DATA AND REASON FOR ADMISSION:  This was 1 of several  inpatient psychiatric admissions for [name] [name], a 45 year old married  Caucasian female who was admitted from [facility], following  treatment for suicide attempt via overdose of her psychotropic  medications.  Please refer to the admission note for further details  pertaining to the symptoms, circumstances and history that led to her  hospitalization.  She was given an initial Axis I diagnosis of bipolar  disorder NOS, and history of cocaine abuse.   MEDICAL AND LABORATORY:  The patient was medically and physically  assessed by the psychiatric nurse practitioner.  She has a history of  obesity, pseudoseizures, and GERD.  She was placed on seizure  precautions.  There were no acute medical issues during her inpatient  stay.  She was continued on her usual Protonix for GERD, albuterol  inhaler for asthma, Topamax 25 mg 2 tablets daily, and Lidoderm patch as  needed for back pain.   HOSPITAL COURSE:  The patient was admitted to the adult inpatient  psychiatric service.  She presented as an obese, but normally-developed  adult female who was depressed, anxious, but absent active suicidal  ideation and verbalizing a strong desire for help.  She was initially  evaluated by Dr. Lolly Mustache.  The undersigned assumed her care on the third  hospital day.  At that time, the patient reported that she was still  having strong auditory hallucinations in spite of her medications.  She  was very uncomfortable and having a lot of back pain as well.   She had come to [place] on a  regimen of Geodon and Depakote, and to address  or symptoms, Geodon was increased to 80 mg b.i.d.   When the patient was feeling better physically, she was more able to  participate in the therapeutic groups and activities we were offering.  By the fifth hospital day her affect was significantly brighter.  She  was in less pain.  She was feeling that her medications were helping in  the way that he had been adjusted.  She was agreeable to a family  meeting with her husband.   On the seventh hospital day her family session was held over the phone  between the patient and her husband.  Husband was supportive.  They  discussed aftercare plans, and the patient agreed to the following.   AFTERCARE:  The patient was to follow up at the [facility] for  enrollment in their intensive outpatient program.  In addition, the  patient was scheduled an appointment with the psychiatrist at the  [facility] for March 13, 2008 at 1 p.m.   DISCHARGE DIAGNOSES:  AXIS I:  Bipolar disorder  NOS, history of cocaine  abuse NOS.  AXIS II:  Deferred.  AXIS III:  History of pseudoseizures, obesity, chronic pain,  gastroesophageal reflux disease, asthma.  AXIS IV:  Stressors severe.  AXIS V:  GAF on discharge 55.      Anselm Jungling, MD  Electronically Signed     SPB/MEDQ  D:  03/07/2008  T:  03/07/2008  Job:  [id number]

## 2010-09-01 NOTE — Discharge Summary (Signed)
[name] [name], DIRUSSO              ACCOUNT NO.:  [id number]   MEDICAL RECORD NO.:  [id number]          PATIENT TYPE:  INP   LOCATION:  2301                         FACILITY:  [facility]   PHYSICIAN:  Herbie Saxon, MDDATE OF BIRTH:  1965/05/21   DATE OF ADMISSION:  02/29/2008  DATE OF DISCHARGE:  03/01/2008                               DISCHARGE SUMMARY   DISCHARGE DIAGNOSES:  1. Altered mental status improved, due to ingestion of Ambien, Geodon,      and Depakote.  2. Suicide attempt.  3. History of bipolar disorder.  4. Major depression.  5. Gastroesophageal reflux disease.  6. Morbid obesity.  7. Posttraumatic stress disorder.  8. History of pseudoseizures.  9. Hypokalemia, repleted.  10.Cocaine abuse.   CONSULTATIONS:  Dr. Antonietta Breach, Psychiatry.   RADIOLOGY:  Chest x-ray of the February 29, 2008, shows low lung volume  with bibasilar atelectasis.   HOSPITAL COURSE:  This 45 year old female was admitted via the emergency  room in a comatose state after having ingested an unspecified amount of  Ambien, Depakote, and Geodon.  The patient was also noticed to have mild  hypokalemia.  She was admitted to the intensive care unit, started on IV  fluid hydration and multivitamins and closely monitored.  The patient is  currently awake and she has been evaluated by psychiatry who has  recommended inpatient psych admission and to continue suicidal watch.   DISCHARGE CONDITION:  Medically stable.   DIET:  No restriction.   ACTIVITY:  Bedrest.   Continue suicide watch, one-on-one observation.  She has also been on  seizure precautions.  She is to be discharged to the [facility] today, to be sent by the  psychiatrist there today.  Followup with the primary care physician at  [facility] in 5-7 days after her psychiatric discharge.   MEDICATIONS:  1. Protonix 40 mg p.o. daily.  2. Multivitamin 1 tablet daily.  3. Phenergan 12.5 mg p.o. q.6 h. p.r.n. for nausea.  4. Tylenol 650 mg p.o. q.6 h. p.r.n. for mild headache.   PHYSICAL EXAMINATION:  GENERAL:  On examination today, she is a middle-  aged lady, obese, not in acute respiratory distress.  VITAL SIGNS:  Temperature is 98, pulse 68, respiratory rate 14, and  blood pressure 133/73.  HEENT:  Pupils equal, reactive to light and accommodation.  Head is  atraumatic and normocephalic.  Mucous membranes are moist.  Oropharynx  and nasopharynx are clear.  NECK:  Supple.  No elevated JVD.  No thyromegaly or carotid bruit.  CHEST:  No chest wall tenderness.  Bilateral air entry equal and breath  sounds vesicular.  CVS:  Heart sounds 1 and 2, regular rate and rhythm.  ABDOMEN:  Soft and nontender.  No organomegaly.  Bowel sounds were  present.  CNS:  No neurologic deficits.  Cranial nerves II through XII intact.  EXTREMITIES:  Peripheral pulses.  There is no pedal edema.   LABORATORY DATA:  WBC is 9.2, hematocrit is 7.8, and platelet count is  295.  Chemistry, sodium is 139, potassium  3.7, chloride 107, bicarbonate  24, glucose 90, BUN 2, and creatinine 0.6.  AST 13, ALT 10, and albumin  2.9.  Drug screen positive for cocaine.   The patient is to undergo detoxing for the psych evaluation for her  depression and substance abuse.  She has been counseled on substance  abuse cessation.   Treatment and discharge plan explained to the patient.  She verbalized  understanding.  A copy of this this discharge summary is to be made  available to [facility] and to the psychiatrist at the  [facility].  Discharge time greater  than 30 minutes.      Herbie Saxon, MD  Electronically Signed     MIO/MEDQ  D:  03/01/2008  T:  03/01/2008  Job:  [id number]

## 2010-09-01 NOTE — Discharge Summary (Signed)
[name] [name], [name] [name]              ACCOUNT NO.:  [id number]   MEDICAL RECORD NO.:  [id number]          PATIENT TYPE:  INP   LOCATION:  2301                         FACILITY:  [facility]   PHYSICIAN:  Herbie Saxon, MDDATE OF BIRTH:  June 26, 1965   DATE OF ADMISSION:  02/29/2008  DATE OF DISCHARGE:                               DISCHARGE SUMMARY   ADDENDUM   MEDICATIONS:  1. Reglan 10 mg every 8 hours p.r.n.  2. Dulcolax 100 mg p.r.n. for constipation.  3. Sucralfate 1 g b.i.d.  4. The Protonix could to be switched for omeprazole 20 mg b.i.d. as an      alternative.      Herbie Saxon, MD  Electronically Signed     MIO/MEDQ  D:  03/01/2008  T:  03/01/2008  Job:  [phone]

## 2010-09-01 NOTE — H&P (Signed)
[name] [name], SCHMELING              ACCOUNT NO.:  [id number]   MEDICAL RECORD NO.:  [id number]          PATIENT TYPE:  INP   LOCATION:  2924                         FACILITY:  [facility]   PHYSICIAN:  Hillery Aldo, M.D.   DATE OF BIRTH:  July 29, 1965   DATE OF ADMISSION:  06/27/2007  DATE OF DISCHARGE:                              HISTORY & PHYSICAL   PRIMARY CARE PHYSICIAN:  Dr. Christell Constant with [facility] practice.   CHIEF COMPLAINT:  Intentional overdose/suicide attempt.   HISTORY OF PRESENT ILLNESS:  The patient is a 45 year old female with a  past medical history of bipolar disorder and suicide attempts who took  an intentional overdose tonight.  The patient states I just got tired  of life.  Apparently, she took her husband's Seroquel and trazodone (8  tablets each).  She also took eight Ambien, as well as Effexor,  Depakote, and Neurontin of uncertain dosage amounts.  She took these  medications at approximately 8:00 p.m. and subsequently called the  sheriff's department, who immediately came to her house and transported  her to the emergency department.  She was given activated charcoal, 50  grams at 9:00 p.m., by the ED physicians.  She is currently awake and  able to answer questions appropriately.   PAST MEDICAL HISTORY:  1. Seizures versus pseudoseizures.  2. Asthma.  3. Hypertension.  4. Anxiety disorder.  5. Bipolar disorder.  6. Carpal tunnel syndrome.  7. Gastroesophageal reflux disease.  8. History of dyslipidemia.  9. History of chronic headaches.  10.Obesity.  11.Post-traumatic stress disorder and panic attacks.  12.Prior history of suicide attempts.   PAST SURGICAL HISTORY:  1. Appendectomy.  2. Back surgery.  3. Knee surgery.   FAMILY HISTORY:  The patient's mother is alive at 28 and has  hypertension and diabetes.  The patient's father is alive at 51 and has  heart disease and COPD.  She has 4 healthy brothers and 2 sons with  psychiatric  disturbances.   SOCIAL HISTORY:  The patient is married.  She is a lifelong nonsmoker.  She has a remote history of crack cocaine abuse.  She denies alcohol  abuse.  She is currently disabled.  She completed the 12th grade level  of education.   ALLERGIES:  1. CODEINE.  2. MORPHINE.  3. TETRACYCLINE.  4. PENICILLIN.   MEDICATIONS:  1. Albuterol 2 puffs p.r.n.  2. Neurontin t.i.d. (dosage uncertain).  3. Protonix 40 mg daily.  4. Depakote 500 mg q.a.m., 1000 mg q.p.m.  5. Vicodin p.r.n.   REVIEW OF SYSTEMS:  The patient endorses feeling suicidal.  She also  reports a history of hallucinations, but none recently.  No fever,  chills, nausea, vomiting, diarrhea, chest pain, shortness of breath,  cough, changes in her bowel habits, or dysuria.   PHYSICAL EXAMINATION:  VITAL SIGNS:  Temperature 97.3, pulse 92,  respirations 18, blood pressure 142/78, O2 saturation 99% on room air.  GENERAL:  This is a lethargic female who awakens to voice.  She is  obese.  HEENT:  Normocephalic, atraumatic.  Pupils are equal, round and reactive  to light with dilated pupils.  Extraocular movements intact.  Oropharynx  reveals dry mucous membranes with charcoal residue.  NECK:  Supple.  No thyromegaly, no lymphadenopathy, no jugular venous  distention.  CHEST:  Lungs clear to auscultation bilaterally with good air movement.  HEART:  Regular rate and rhythm.  No murmurs, rubs or gallops.  ABDOMEN:  Soft, nontender, nondistended with normoactive bowel sounds.  EXTREMITIES:  No clubbing, edema or cyanosis.  SKIN:  Warm, dry.  No rashes.  NEUROLOGIC:  The patient is slightly lethargic but awakens to voice.  She is oriented x3.  Cranial nerves II-XII grossly intact.  Moves all  extremities x4 with equal strength.   DATA REVIEW:   LABORATORY DATA:  White blood cell count is 5.5, hemoglobin 13,  hematocrit 37.4, platelets 260.  Sodium is 137, potassium 3.0, chloride  103, bicarbonate 123, BUN 5,  creatinine 0.70, glucose 99.  Liver  function studies are within normal limits.  Urine drug screen is  negative.  Alcohol level was less than 5, Tylenol level was less than  10, salicylate level was less than 4.  Valproic acid level was 71.2.  Tricyclic acid screen is positive.  Urine pregnancy testing is negative.   ASSESSMENT/PLAN:  1. Intentional overdose/suicide attempt:  We will admit the patient to      the step-down unit and monitor her closely.  We will have a 24-hour      sitter for safety purposes.  She will receive a psychiatric      consultation in the morning.  Poison control has been contacted      with regard to this overdose, and they recommend monitoring for      hypotension, tachycardia, lethargy, and EKG changes including      widening of the QRS and prolongation of the QTC.  If the QRS widens      to greater than 140 milliseconds, sodium bicarbonate bolus at 1-2      mEq/kg is recommended.  Will monitor her closely and have the      nursing staff call us for any widening of the QRS greater than 140.      We will hold all of her medications for now until she is more awake      and alert and resume them at the discretion of the psychiatric      consultant.  2. Hypokalemia:  Will replete the patient's potassium IV.  3. Gastroesophageal reflux disease:  Place the patient on IV proton      pump inhibitor therapy.  4. Asthma:  Will administer Xopenex p.r.n. for any signs of      bronchospasm.  Her lungs are currently clear.  5. Prophylaxis:  Again, the patient will be on Protonix for GI      prophylaxis.  Will start Lovenox for DVT prophylaxis.      Hillery Aldo, M.D.  Electronically Signed     CR/MEDQ  D:  06/28/2007  T:  06/29/2007  Job:  [id number]   cc:   Ernestina Penna, M.D.

## 2010-09-04 NOTE — Cardiovascular Report (Signed)
[place]  Patient:    [name] [name], [name] [name] Visit Number: [id number] MRN: [id number]          Service Type: Attending:  Peter M. Swazil[place], M.D. Dictated by:   Peter M. [place], M.D. Proc. Date: 01/19/01   CC:         Elvina Sidle, M.D., [facility]   Cardiac Catheterization  INDICATION FOR PROCEDURE:  A 45 year old female with multiple cardiac risk factors had recent symptoms of chest pain.  Stress Cardiolite study suggested anterior wall ischemia.  PROCEDURE:  Cardiac catheterization.  CARDIOLOGIST:  Peter M. Sw[place]iland, M.D.  ACCESS:  Via the right femoral artery and vein using standard Seldinger technique.  EQUIPMENT:  A 6-French 4 cm right and left Judkins catheters, 6-French pigtail catheter, 6-French arterial sheath.  MEDICATIONS:  Local anesthesia with 1% Xylocaine.  CONTRAST: 120 cc of Omnipaque.  HEMODYNAMIC DATA:  Aortic pressure 114/73 with a mean of 94 mmHg.  Left ventricular pressure 116 with an EDP of 22 mmHg.  ANGIOGRAPHIC DATA:  Left coronary artery arises and distributes normally.  Left main coronary artery is normal.  Left anterior descending artery and its branches are normal.  The left circumflex coronary artery gives rise to two moderate size marginal vessels.  It is normal.  The right coronary artery arises and distributes normally.  It is a dominant vessel.  It is also normal.  LEFT VENTRICULOGRAPHY:  Left ventricular angiography was performed in the RAO view.  This demonstrates normal left ventricular size and contractility.  Left ventricular function is normal with ejection fraction estimated at 65%.  There is no mitral regurgitation or prolapse.  FINAL INTERPRETATION: 1. Normal coronary anatomy. 2. Normal left ventricular function. Dictated by:   Peter M. [place], M.D. Attending:  Peter M. [place], M.D. DD:  01/19/01 TD:  01/19/01 Job: [id number]

## 2010-09-04 NOTE — Discharge Summary (Signed)
[facility]  Patient:    [name] [name], [name] [name] Visit Number: [id number] MRN: [id number]          Service Type: PSY Location: 500 0502 01 Attending Physician:  Rachael Fee Dictated by:   Reymundo Poll Dub Mikes, M.D. Admit Date:  08/05/2001 Discharge Date: 08/22/2001                             Discharge Summary  CHIEF COMPLAINT AND HISTORY OF PRESENT ILLNESS:  This was the second admission to [facility] for this 45 year old female voluntarily admitted for depression, suicidal ideas, and cocaine abuse.  History of depression, suicidal thoughts, had been abusing cocaine, "tired of what she is doing," in debt.  Was having suicidal thoughts to overdose, ______ with a knife or a screwdriver in anticipation of her husband coming home with crack cocaine.  Smoking crack on and off since 1998.  Having homicidal thoughts toward the ex-husband who is presently in jail.  Experiencing decreased sleep, decreased appetite, 5 pound weight loss, withdrawal symptoms, abdominal cramping.  PAST PSYCHIATRIC HISTORY:  History of bipolar disorder.  Second time at [facility], June 2002 for a suicidal attempt.  Sees Dr. Marlou Porch at [facility].  SUBSTANCE ABUSE HISTORY:  Occasional alcohol use.  Smoked crack cocaine on and off since 1998.  Had been abusing some opioids; last use one month ago.  PAST MEDICAL HISTORY:  Asthma, gastroesophageal reflux disease.  MEDICATIONS ON ADMISSION: 1. Depakote ER 1500 mg at bedtime. 2. Zyprexa 10 mg at night. 3. Albuterol p.r.n. 4. Celexa 40 mg daily. 5. BuSpar 15 mg twice a day. 6. Nexium 40 mg daily. 7. Advair 500/50 mg twice a day.  PHYSICAL EXAMINATION:  GENERAL:  Performed and failed to show any acute findings.  MENTAL STATUS EXAMINATION ON ADMISSION:  Alert, overweight, casually dressed female, cooperative, limping.  Speech was normal and relevant.  Mood  was depressed.  Affect was constricted.  Thought processes: Coherent; no evidence of auditory or visual hallucinations or paranoia.  Cognitive: Well preserved.  ADMITTING DIAGNOSES: Axis I:    1. Bipolar disorder.            2. Cocaine abuse.            3. Opiate abuse. Axis II:   No diagnosis. Axis III:  1. Asthma.            2. Gastroesophageal reflux disease. Axis IV:   Moderate. Axis V:    Global assessment of functioning upon admission 25, highest global            assessment of functioning in the last year 65  LABORATORY DATA:  CBC was within normal limits.  Blood chemistries were within normal limits.  Thyroid profile was within normal limits.  Liver profile was within normal limits.  Drug screen was negative for substances of abuse.  HOSPITAL COURSE:  She was admitted and started in intensive individual and group psychotherapy.  She was kept on her medications.  She was given Symmetrel 100 mg twice a day to help with the cocaine cravings.  She was kept on BuSpar 15 mg twice a day, Zyprexa 10 mg at night, Celexa 40 mg daily, Depakote ER 1500 mg at bedtime, Nexium, Advair, albuterol, and Singulair. Zyprexa was eventually increased to 15 mg at night, Celexa increased to 60 mg, and BuSpar was increased to 30 mg twice a day.  Zyprexa was increased to 5 mg twice a day and 20 mg at night.  She was also given trazodone for sleep. Celexa was increased to 80 mg per day.  As we continued to adjust the medications, her mood improved, her affect became a little brighter.  There was a family session with the husband that did not go as well as expected. She still was endorsing some hopelessness and helplessness.  On May 6, she was improved.  She understood that she needed to continue to deal with sobriety so she was discharge to be admitted to ______ program at [facility] for further work on her abstinence.  Upon discharge, more hopeful, anticipating being admitted to the  rehabilitation program at [facility].  She will continue to use her medications and after discharge from the program, she was going to continue outpatient followup.  DISCHARGE DIAGNOSES: Axis I:    1. Cocaine, opioid dependence.            2. Bipolar disorder, hypomanic. Axis II:   No diagnosis. Axis III:  1. Bronchial asthma.            2. Gastroesophageal reflux disease. Axis IV:   Moderate. Axis V:    Global assessment of functioning upon discharge 50-55.  DISCHARGE MEDICATIONS: 1. Depakote ER 500 mg three at night. 2. Nexium 40 mg every day. 3. Advair 500/50 twice a day. 4. Albuterol inhaler, Singulair 10 mg daily. 5. Symmetrel 100 mg twice a day. 6. BuSpar 30 mg twice a day. 7. Zyprexa 20 mg at bedtime. 8. Zyprexa 5 mg twice a day. 9. Celexa 80 mg per day.  FOLLOWUP:  ______ at [facility]. Dictated by:   Reymundo Poll. Dub Mikes, M.D. Attending Physician:  Rachael Fee DD:  09/27/01 TD:  09/30/01 Job: [id number]
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
## 2010-09-04 NOTE — H&P (Signed)
. [facility]  Patient:    [name] [name], [name] [name]                    MRN: [id number] Adm. Date:  06/22/99 Attending:  Sandria Bales. Ezzard Standing, M.D. CC:         Collins Scotland, M.D.             Dr. Marlou Porch, [facility].                         History and Physical  HISTORY OF ILLNESS:  [name] is a 45 year old white female, a patient of  Dr. Collins Scotland.  She was awakened this morning at 4 a.m. with the sudden onset of, what she describes as a "ripping abdominal pain."  She has a history of acid reflux, but this was not that kind of symptoms.  She has had no ulcer disease, o liver disease, no pancreatic disease or any colon disease.  Her only prior abdominal surgery was a tubal ligation about 1989.  The pain worked steadily throughout her entire abdomen during most of the morning.  She went to see Dr. Faustino Congress in the office at about 10 this morning; was referred to the [facility] Emergency Room, where she appeared between 12 and 1:00 oclock.  A CT scan was obtained, read by Dr. Dwyane Dee.  The CT scan revealed and enlarged appendix, consistent with acute appendicitis and bilateral complex ovarian cyst. Dr. Gery Pray suggested to have a follow-up ultrasound in two to three months.  PAST MEDICAL HISTORY:  ALLERGIES:  TETRACYCLINE (makes her brain swell and puts her in a coma); KEFLEX, PENICILLIN, CODEINE, MORPHINE, PYRIDIUM (all leads to either hives and/or itching); BETADINE (makes her skin break out).  CURRENT MEDICATIONS: 1. Depakote 500 mg b.i.d. 2. Wellbutrin 150 mg b.i.d. 3. Propanolol 20 mg t.i.d. 4. Pepcid 20 mg b.i.d.  REVIEW OF SYSTEMS:  NEUROLOGIC:  She has a history of bipolar disease; treated through the [facility] Department (Dr. Marlou Porch).  She says she was diagnosed with this about four or five years ago.  PULMONARY:  No history of pneumonia or tuberculosis.  CARDIAC:  She  has had a remote history of PAT, but o history of chest pain or hypertension.  GASTROINTESTINAL:  See history of present illness.  UROLOGIC:  No history of kidney stones or kidney infections. GYNECOLOGIC:  She is a gravida 3, para 3, aborta 0.  Her tubal ligation was done on October 1989.  SOCIAL HISTORY:  She works in Editor, commissioning at [employer].  PHYSICAL EXAMINATION:  VITAL SIGNS:  Temperature 101.4, blood pressure 100/56, pulse 112, respirations 20.  GENERAL:  She is a well-nourished, pleasant, moderately obese white female; who is uncomfortable.  NECK:  Supple without masses or thyromegaly.  LUNGS:  Clear to auscultation.  HEART:  Regular rate and rhythm without murmur or rub.  ABDOMEN:  Reveals decreased bowel sounds.  She has tenderness with guarding and  rebound in the right lower quadrant.  She is obese, which really limits the physical examination.  LABS:  (obtainable at time of dictation)  URINALYSIS -- Unremarkable.  White blood count 8,500 with 85% neutrophils, hemoglobin 13, hematocrit 37.0, platelet count 305,000.  Sodium 135, potassium 3.9, chloride 104, CO2 24, glucose 87, creatinine 0.7.  CT SCAN:  Was reviewed with Dr. Gery Pray, showing acute appendicitis.  IMPRESSION: 1. ACUTE APPENDICITIS.  Discussed with the patient concerning  laparoscopic versus    possible open cholecystectomy.  Will plan to take the patient to the operating    room and plan to do a laparoscopic exploration and appendectomy.  Also discussed    the possibility of open surgery and other potential diagnoses with the patient    and her parents. 2. BIPOLAR DISEASE. 3. REFLUX ESOPHAGITIS. 4. REMOTE HISTORY OF PAT. DD:  06/22/99 TD:  06/22/99 Job: [id number]

## 2010-09-04 NOTE — Discharge Summary (Signed)
NAMETANESSA, TIDD              ACCOUNT NO.:  [id number]   MEDICAL RECORD NO.:  [id number]          PATIENT TYPE:  IPS   LOCATION:  0400                          FACILITY:  [facility]   PHYSICIAN:  [name] Jungling, MD  DATE OF BIRTH:  1965-09-07   DATE OF ADMISSION:  07/02/2008  DATE OF DISCHARGE:  07/05/2008                               DISCHARGE SUMMARY   IDENTIFYING DATA AND REASON FOR ADMISSION:  This was one of many [facility]  inpatient psychiatric admissions for [name] [name], a 45 year old married  female, who was admitted in crisis in relation to multiple family  stressors.  Please refer to the admission note for further details  pertaining to the symptoms, circumstances and history that led to her  hospitalization.  She was given an initial Axis I diagnosis of  schizoaffective disorder, depressed.   MEDICAL AND LABORATORY:  The patient has a history of GERD, but  otherwise is generally in good health.  She is obese.  She was medically  and physically assessed by the psychiatric nurse practitioner.  There  were no significant medical issues during her stay.  She was continued  on an acid blocker for her brief treatment course.   HOSPITAL COURSE:  The patient was admitted to the adult inpatient  psychiatric service.  She presented as an obese, disheveled female in  bed, sleeping, and tired, but oriented.  She indicated that she had no  immediate needs or requests, but otherwise interacted little in the  initial interview.  She indicated that her primary stressor was the  recent incarceration of her husband.   The patient was continued on her usual psychotropic regimen of Geodon,  Depakote, and Effexor.  She did not participate in the treatment program  at all, remaining in bed.   On the second hospital day, she stated that she would try to attend  groups, but later in the day developed a headache.  She was given  Darvocet for this, and she states that because the Darvocet made her  sleepy, she remained in bed the rest of the day.   On the fourth and final hospital day, the patient had still not  participated in any therapeutic groups and activities.  She insisted on  remaining in bed.  She complained that her back was hurting and that her  legs were tingling.  She wanted to be sent to the medical hospital.  It  was felt that this was unnecessary and inappropriate.  It was felt that  it was most likely that the patient was experiencing such symptoms in  relation to her depression, her anxiety, and her choosing to remain in  bed for most of the previous 72 hours.  The patient was encouraged to  get out of bed, to ambulate, and to participate in therapeutic groups  and activities.  She requested discharge.  She denied any suicidal  ideation.  It did not appear that further hospital stay was going to be  productive.  She was discharged accordingly.   AFTERCARE:  The patient was to follow up at [facility]  with an appointment to see their psychiatrist on July 18, 2008,  at 9:30 a.m.   DISCHARGE MEDICATIONS:  Geodon 40 mg q.a.m., 80 mg at bedtime, Depakote  500 mg b.i.d., Effexor XR 37.5 mg daily, and Prilosec 20 mg daily.   DISCHARGE DIAGNOSES:  Axis I:  Adjustment disorder with depressed mood,  history of bipolar disorder.  Axis II:  Deferred.  Axis III:  Gastroesophageal reflux disease.  Axis IV:  Stressors:  Severe.  Axis V:  GAF on discharge 50.      [name] Jungling, MD  Electronically Signed     SPB/MEDQ  D:  07/09/2008  T:  07/09/2008  Job:  [id number]

## 2010-09-04 NOTE — Discharge Summary (Signed)
NAME:  [name] [name], [name] [name]           ACCOUNT NO.:  [id number]   MEDICAL RECORD NO.:  [id number]          PATIENT TYPE:  INP   LOCATION:  1401                         FACILITY:  [facility]   PHYSICIAN:  Deirdre Peer. Polite, M.D. DATE OF BIRTH:  02-Apr-1966   DATE OF ADMISSION:  03/18/2006  DATE OF DISCHARGE:                               DISCHARGE SUMMARY   ADDENDUM:  The patient's hospitalization was prolonged because of some  observed suspicious activity for seizures.  It is written as suspicious  because the patient seemed to be talking during the seizure activity.  Nonetheless, for completeness sake, the patient underwent evaluation  with CT of the head without acute abnormality and EEG which was reported  as normal.  The patient has not had any recurrent events during this  hospitalization.  The patient stated that she thought she may have had  seizure trouble before and had been on Neurontin.  She does not know the  dose.  No documentation in the chart reveals this, and there are several  H&Ps and discharge from recent hospitalizations.  The patient has been  asked to call her son to assist with the dosing of that medication.  At  this time, the patient is thought to be medically stable for discharge  to a psychiatric facility for continued management and treatment of her  suicide attempt with Darvocet.      Deirdre Peer. Polite, M.D.  Electronically Signed     RDP/MEDQ  D:  03/24/2006  T:  03/24/2006  Job:  [id number]

## 2010-09-04 NOTE — Procedures (Signed)
EEG NUMBER:  [id number].   The date of birth is not noticed on this intake sheet. The patient's  medical record number is [id number].  This is a 45 year old female  without her date of birth being mentioned.  This patient was referred  for a standard EEG non STAT, non sleep deprived at the bedside in her  ICU.  The patient is according to the technician awake and asleep during  this recording.  She was not exposed to photic stimulation or  hyperventilation.   CURRENT MEDICATIONS:  Phenergan, Haldol, Zofran and Tylenol.  The  patient made a suicide attempt with an overdose of Darvocet the  recording states.   This is six channel EEG recording with one channel representing heart  rate and rhythm exclusively.  A posterior dominant rhythm cannot be  established as the patient does not cooperate with eye closure and eye  opening.  After her eyes are closed by the technician, a nondominant  occipital rhythm emerges at 6 Hz.  The amplitude for the EEG activity is  less than 25 microvolts and is interrupted by generalized high amplitude  activity about every 40-50 seconds with amplitudes of approximately 100  mV.  These are high amplitude waves that occur in frequencies between 3  and 4 Hz and last for about 1-2 seconds, then the low amplitude activity  resumes.  There is no sharp wave and no spike wave formation noted and  the EKG rhythm stays stable between 62 and 68 beats per minute  throughout these activity changes.  According to the technician's notes,  the patient is not responsive and only to what is the very end of the  recording is motor activity artifact noticed.  The patient was asked  where she is.  The technician did not note if the patient was able to  answer the question.   CONCLUSION:  This is an EEG with a documented slow rhythm which is  generalized.  There is no posterior dominance noticed during this  recording indicating that the patient is not alert and awake.  Focality  was not noticed neither for slowing or for epileptiform discharges.  The  intermittent rhythm changes and amplitude changes are generalized, but  do not appear to constitute seizure activity.  Heart rate and motor  activity did not change.  Clinical correlation is recommended.      Melvyn Novas, M.D.  Electronically Signed     XB:[id number]  D:  03/23/2006 17:18:56  T:  03/24/2006 10:31:02  Job #:  [id number]

## 2010-09-04 NOTE — Discharge Summary (Signed)
[place]. [facility]  Patient:    [name] [name], [name] [name] Visit Number: [id number] MRN: [id number]          Service Type: MED Location: [phone] Attending Physician:  Garnette Scheuermann Dictated by:   Pricilla Holm, M.D. Admit Date:  01/25/2001 Discharge Date: 01/27/2001   CC:         Dr. Faustino Congress   Discharge Summary  PRIMARY CARE PHYSICIAN:  Dr. Faustino Congress, [facility].  DISCHARGE DIAGNOSES:  1. Asthma exacerbation.  2. Strep pharyngitis.  3. Oral candidal infection.  4. Bipolar disorder.  5. Hypertension.  6. Depression.  7. Thought disorder.  8. History of drug abuse.  9. History of alcohol abuse. 10. Gastroesophageal reflux disease.  DISCHARGE MEDICATIONS:  1. Maxzide.  2. Aceon.  3. Depakote 1500 mg q.i.d.  4. Advair discuss.  5. Albuterol metered dose inhaler.  6. Nexium.  7. BuSpar 15 mg q.i.d.  8. Celexa 40 mg q.d.  9. Seroquel 50 mg q.i.d. 10. Trazodone q.h.s. 11. Prednisone 20 mg q.d. x 4 days. 12. Azithromycin 250 mg q.d. x 3 days.  PROCEDURES: 1. Spiral chest CT which revealed no pulmonary embolus.  It did reveal,    however, a mild bibasilar dependent atelectasis. 2. Pulmonary function tests were performed which revealed an FVC of 3.85, 106%    predicted, FEV1 3.26, 115% predicted, FEF 75%, 1.53, 63% predicted, FEV1 to    FVC ratio was 85, which is 108% predicted.  Summary:  Spirometry within    normal limits.  HISTORY OF PRESENT ILLNESS:  The patient is a 45 year old white female patient of Dr. Mila Merry, who presents to the emergency room complaining of shortness of breath x 3 days with chest pain.  She also reports nausea and vomiting x 2 days, and headache.  She had a cardiac catheterization by Dr. [place] secondary to chest pain, and the catheterization was found to be within normal limits.  No significant coronary artery disease.  She was recently diagnosed with Strep throat  approximately 7 days, and has only taken antibiotics for approximately 5 days.  She was seen by Dr. Faustino Congress in his office the day before admission.  She was given IM steroids and albuterol breathing treatments, as well as a prescription for Lasix.  She did not, however, fill that prescription for Lasix, and came to the emergency room, as she did not feel that she was improving.  PAST MEDICAL HISTORY: 1. Asthma. 2. Gastroesophageal reflux disease. 3. Bipolar disorder. 4. Hypertension. 5. Thought disorder. 6. History of drug abuse. 7. History of alcohol abuse. 8. Depression. 9. Anxiety.  ALLERGIES:  TETRACYCLINE, MORPHINE, PENICILLIN.  PHYSICAL EXAMINATION:  VITAL SIGNS:  Afebrile, anxious, but in no acute distress.  LUNGS:  Decreased breath sounds throughout with faint bibasilar crackles, no rashes.  NEUROLOGIC:  Intact.  LABORATORY DATA:  White blood cell count 6.4, hemoglobin 12.9, hematocrit 37.2, MCV 94, ANC 4.7.  Sodium 135, potassium 4.4, chloride 105, bicarbonate 17, BUN 10, creatinine 0.6, glucose 105.  Her UA was negative.  Micro revealed a few epithelial cells, 3 to 6 white blood cells, trace leukocyte esterase. Chest x-ray reveals some questionable pulmonary vascular congestion with low volume films, minimal bibasilar atelectasis.  Chest CT revealed no pulmonary embolus.  Urine drug screen was negative.  TSH 4.8.  Depakote level within normal limits at 68.5.  The patient was admitted to the [facility] for further workup.  HOSPITAL COURSE:  #1 - ASTHMA  EXACERBATION:  The patient was given 40 mg of Lasix IV in the ED, and was supported with albuterol nebulizer treatments and oxygen.  She was also given 60 mg of steroids in the ED as well prior to coming to the floor. Mr. [name] was started on azithromycin, and continued her home medications.  She remained stable and saturating in the high 90s throughout her hospitalization, including  her brief stay in the emergency department. Peak flows were checked, and initially her peak flows were around 200.  She states that she is normally in the 300s.  By the next day, her peak flows were in the upper 300s, and pulmonary function tests were obtained, as per above. Her spirometry was within normal limits, and she was feeling much better and breathing much easier the evening of admission, and felt that she was back to baseline the next morning.  She was deemed stable to be discharged to home. She was given a peak flow meter so that she could check her peak flows at home, and was requested to continue taking medications within the same regimen as prior to admission.  She will also be sent home with a four day low dose prednisone course.  #2 - HYPERTENSION:  The patients blood pressure remained stable and was not an issue during this hospitalization.  Her home medications were continued.  #3 - BIPOLAR DISORDER:  We continued Depakote during her hospitalization.  Her Depakote level was within normal limits, and thought that this was not contributing to her dyspnea.  #4 - DEPRESSION:  Her BuSpar and Celexa were continued.  #5 - THOUGHT DISORDER:  Seroquel continued, and she remained stable.  #6 - STREP DIAGNOSIS:  Azithromycin was initiated, and will be completed for a full course so that her strep pharyngitis can be fully treated.  #7 - ORAL THRUSH:  The patient was given fluconazole p.o. x 1, and felt much relief the next morning.  CONDITION ON DISCHARGE:  Good.  DISPOSITION:  Discharged to home.  DISCHARGE INSTRUCTIONS:  The patient is to continue taking her medications as previously prescribed, as well as the prednisone and azithromycin which are new additions to her medical regimen.  FOLLOWUP:  Dr. Faustino Congress on February 01, 2001, as previously scheduled.  The patient voiced agreement and understanding of the above plan, and has no further questions.  Dictated by:    Pricilla Holm, M.D. Attending Physician:  Garnette Scheuermann DD:  01/27/01 TD:  01/27/01 Job: [id number]

## 2010-09-04 NOTE — Discharge Summary (Signed)
[place]. [facility]  Patient:    [name] [name], [name] [name]                    MRN: [id number] Adm. Date:  [id number] Disc. Date: [id number] Attending:  Andre Lefort CC:         Maye Hides, M.D.             Dr. Sherrill Raring, [facility]                           Discharge Summary  DATE OF BIRTH:  [birth year]  DISCHARGE DIAGNOSES: 1. Acute appendicitis. 2. Bipolar disease. 3. Reflux esophagitis. 4. Remote history of paroxysmal atrial tachycardia.  OPERATIONS:  She had a laparoscopic appendectomy on June 22, 1999.  HISTORY:  [name] is a 45 year old white female patient of Dr. Maye Hides at [facility].  She also is seen by Dr. Sherrill Raring at [facility] for bipolar disease.  She awoke at 4 a.m. on the morning of June 22, 1999 with a ripping abdominal pain and was seen in Dr. Mila Merry office, who immediately referred her to the emergency room.  In the emergency room, she underwent a CT scan, which revealed an enlarged appendix consistent with acute appendicitis.  She also was noted to have bilateral complex ovarian cyst on CT scan.  ALLERGIES: 1. TETRACYCLINE. 2. KEFLEX. 3. PENICILLIN. 4. CODEINE. 5. MORPHINE. 6. PERIDIUM. 7. BETADINE.  CURRENT MEDICATIONS: 1. Depakote 500 mg b.i.d. 2. Wellbutrin 150 mg b.i.d. 3. Propranolol 20 mg t.i.d. 4. Pepcid 20 mg b.i.d.  REVIEW OF SYSTEMS:  She has bipolar disease treated through [facility] and Dr. Sherrill Raring.  She says she was diagnosed about four to five years prior to this admission.  Cardiac:  She has a remote history of PAT but has no current chest pain, hypertension, and does not see a cardiologist.  PHYSICAL EXAMINATION:  VITAL SIGNS:  Temperature 101.4, blood pressure 100/56, pulse 112.  ABDOMEN:  Decreased bowel sounds with tenderness and guarding in the right lower quadrant.  LABORATORY DATA:   White blood count 8500 with 85% neutrophils and her electrolytes were okay.  HOSPITAL COURSE:  She was taken to the operating room on the day of admission, where she underwent a laparoscopic appendectomy for acute appendicitis. Postoperatively, she had some nausea for the first two days after surgery, low-grade fever, and was very slow in moving.  I kept her on Cipro for three days but now she is three days postop.  She is afebrile and doing much better. Wounds look good and she is ready for discharge.  Her final pathology is pending at the time of this dictation.  DISCHARGE MEDICATIONS:  Resume her home medicines.  She was given Vicodin for pain.  DISCHARGE INSTRUCTIONS:  She should not drive for four to five days.  She is to go on a low fat diet.  She can shower for her wounds and see me back in two weeks,  call for an appointment.  CONDITION ON DISCHARGE:  Good. DD:  06/25/99 TD:  06/26/99 Job: [id number]

## 2010-09-04 NOTE — Assessment & Plan Note (Signed)
[name] [name] comes to the center for pain management today for  evaluation. I discussed her with Dr. Pamelia Hoit who personally examined the  patient, reviewed available medical record, and progress to date.   1. [name] comes to [place] today complaining of left foot pain. 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 She also has an     underlying radicular component and has had previous lumbar laminectomy     and believes that this also plays into her overall pain pattern. She     relates her pain at a fairly high level with decline in functional     indices and quality of life indices.  2. I performed a medication inventory. I will go ahead and start her on     Topamax. She is a rather large individual, interested in loosing weight     as best outcome predicted element, had lost 30 pounds, and Topamax should     help with weight control as well as the radicular and central __________     disorder.  3. Duragesic is discussed with her, as an alternative to her medications. We     are aware of previous substance abuse issues, and I believe this is a     smooth agent for transitioning, we gave her informational materials. She     understands the potential habituating nature, cautions to driving and     operating machinery, and important cognitive decisions and review of     patient care agreement. This is an easier medication to start moving away     from. She is out of her medications and a little anxious, possible some     early withdrawal. She has been on oxycodone and hydrocodone mixture since     last July. I cautioned as to these medications.  4. I will go ahead as both diagnostic and therapeutic, a lumbar sympathetic     block in followup. Risks of this procedure is reviewed. Complications and     options are reviewed and likely directed care approach as well.   OBJECTIVE:  Modest  pseudomotor changes, some sensitivity with most  provocative antagonistic movements. Capillary filling is fine, adequate  range of motion, intact neurologically in motor, sensory, and reflexes.   Of note, she has also had a bone scan, and I will try to get that report,  and I reviewed available records. I have discussed treatment, limitations,  and options. She is instructed to maintain contact with primary care.   We will see her in followup. Discharge instructions include _________  overall expectations, risks, complications, and options of the procedure.  She accepts and wishes to proceed.      Celene Kras, MD   HH/MedQ  D:  05/14/2003 12:35:22  T:  05/14/2003 13:21:46  Job #:  [id number]   cc:   Farris Has, M.D.
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
## 2010-09-04 NOTE — H&P (Signed)
NAME:  [name] [name], [name] [name]           ACCOUNT NO.:  [id number]   MEDICAL RECORD NO.:  [id number]          PATIENT TYPE:  EMS   LOCATION:  ED                           FACILITY:  [facility]   PHYSICIAN:  Deirdre Peer. Polite, M.D. DATE OF BIRTH:  [birth year]   DATE OF ADMISSION:  03/18/2006  DATE OF DISCHARGE:                              HISTORY & PHYSICAL   CHIEF COMPLAINT:  Suicide attempt.   HISTORY OF PRESENT ILLNESS:  A 45 year old female brought to [facility] via EMS after suicide attempt.  The patient states she took  several pills, reportedly Darvocet, in an attempt to kill herself.  The  patient states she heard voices telling her to harm herself.  In the ED  the patient was evaluated, vitals fairly stable, 4-hour acetaminophen  level 227, urine drug screen positive for opiates.  CBC unremarkable.  BMET:  Hypokalemia at 3.2, creatinine 0.8.  LFTs within normal limits.  Alcohol level less than 5.  Salicylate level less than 4.  No  coagulation studies have been done at this time.  Based on this level,  Mucomyst has been indicated, which has been started in the ED.  [facility]  Hospitalist has been called for further evaluation and admission.  At  the time of evaluation, the patient is very groggy, gives details as  stated above.  Admission is deemed necessary for further evaluation and  treatment of acetaminophen toxicity and an attempt at suicide.   PAST MEDICAL HISTORY:  The patient admits to having depression,  hypertension.   MEDICATIONS:  States she takes Cozaar at home, unknown dose, also  Darvocet.   SOCIAL HISTORY:  Denies tobacco, alcohol or drugs.   PAST SURGICAL HISTORY:  She states she has had several knee surgeries  and an appendectomy in the past.   ALLERGIES:  Describes allergy to MORPHINE, BETADINE and CODEINE.   FAMILY HISTORY:  Noncontributory.   REVIEW OF SYSTEMS:  Limited secondary to the patient being very  lethargic.   PHYSICAL EXAMINATION:   GENERAL:  The patient is very lethargic and  answers simple yes-no questions.  VITAL SIGNS:  Temperature 98.1, BP 136/90, pulse 86, respiratory rate of  16, saturating 96%.  HEENT:  Anicteric sclerae.  Pupils equal, round, and reactive to light.  Moist oral mucosa.  NECK:  No nodes.  No JVD.  CHEST:  Moderate air movement bilaterally.  CARDIOVASCULAR:  Regular, no S3.  ABDOMEN:  Soft, nontender.  EXTREMITIES:  No edema.  NEUROLOGIC:  Very lethargic but easily arousable.  Grossly moves all  extremities.   DATA:  As stated in the HPI.   ASSESSMENT:  1. Drug overdose and attempt at suicide.  2. Tylenol toxicity for a level of 227.  3. History of depression with admittedly admission to a psychiatric      facility before, as of late [facility] some time in September.  4. Hypertension.   RECOMMENDATIONS:  Patient to be admitted to a step-down unit for  treatment of acetaminophen toxicity.  The patient will be started on IV  Mucomyst.  Will obtain coagulation studies, LFTs periodically.  Psychiatry  evaluation, drug screen, alcohol level and salicylate level  have been ordered.  Will make further recommendations as deemed  necessary.  So far her potassium has been repleted in the ED and we will  have follow-up labs to continue to check on this matter.  Will make  further recommendations as deemed necessary.      Deirdre Peer. Polite, M.D.  Electronically Signed     RDP/MEDQ  D:  03/18/2006  T:  03/18/2006  Job:  [id number]

## 2010-09-04 NOTE — Discharge Summary (Signed)
[name] [name], [name] [name]           ACCOUNT NO.:  [id number]   MEDICAL RECORD NO.:  [id number]          PATIENT TYPE:  INP   LOCATION:  1401                         FACILITY:  [facility]   PHYSICIAN:  Hollice Espy, M.D.DATE OF BIRTH:  [birth year]   DATE OF ADMISSION:  03/18/2006  DATE OF DISCHARGE:  03/21/2006                         DISCHARGE SUMMARY - REFERRING   CONSULTATION:  Antonietta Breach, M.D., psychiatry.   DISCHARGE DIAGNOSES:  1. Intentional self-poisoning attempt using medication, believed to be      Percocet which contains both opiates and Tylenol component.  2. Hypokalemia, treated.  3. Depression.  4. History of hypertension.   DISCHARGE MEDICATIONS:  All medications for this patient are on hold.  Patient normally takes Cozaar.  Whether this medication is to be resumed  would be up to the discretion of psychiatry.  Her other medications for  depression will also be as per psychiatry.   HOSPITAL COURSE:  Patient is a 45 year old white female with past  medical history of depression who was not on any home medications for  depression, who was [facility] after EMS  brought her for a suicide attempt.  She had taken several pills in  attempt to kill herself.  Patient stated that she heard voices telling  her to harm herself.  When she presented, she was noted to have a  Tylenol level of 227 and was started on Mucomyst as poison control  protocol.  Patient was admitted to the stepdown unit and continued.  She  was awake and alert.  She still complained to be hearing voices and  still thought that she was suicidally active.   By hospital day #2, these symptoms persisted.  Psychiatry met with the  patient and felt that she had severe depression and was in need of  psychiatric hospitalization after she was medically clear.  They felt  that if she refused she would need to be sent on involuntary commitment.  Over the next day, patient was continued  on the Mucomyst protocol.  Liver function tests and Tylenol levels were normalized and otherwise  she was doing well.  She did complain of a continued headache.  Patient  had a lumbar puncture done at one point, she seemed to be having spinal  headache, however, these symptoms were improved with Toradol and this  was felt to go against a spinal headache.  It was more likely felt that  this was secondary to a severe rebound migraine following recovery of  narcotic medication which can occur.  Patient was still noting that she  heard audible voices and still felt that she wanted to kill herself.  At  this point, she was felt to be medically stable and now she has a follow-  up visit by psychiatry who recommended continued inpatient transfer.  Patient now currently is waiting transfer to a psychiatric facility.  Patient's overall disposition from initial presentation from a medical  and physical standpoint, she is improved; however, from a psychiatric  standpoint, she still needs inpatient counseling.   DIET:  Low sodium diet.   ACTIVITY:  As per the  inpatient psychiatric facility and she is being  transferred there once a bed has been made available.      Hollice Espy, M.D.  Electronically Signed     SKK/MEDQ  D:  03/21/2006  T:  03/21/2006  Job:  [id number]

## 2010-09-04 NOTE — Op Note (Signed)
NAME:  [name] [name], [name] [name]           ACCOUNT NO.:  [id number]   MEDICAL RECORD NO.:  [id number]          PATIENT TYPE:  INP   LOCATION:  5028                         FACILITY:  [facility]   PHYSICIAN:  Sharolyn Douglas, M.D.        DATE OF BIRTH:  [birth year]   DATE OF PROCEDURE:  12/09/2005  DATE OF DISCHARGE:                                 OPERATIVE REPORT   DIAGNOSES:  1. 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  2. 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  3. Post laminectomy syndrome.   PROCEDURES:  1. Removal of L5-S1 pedicle screw instrumentation.  2. Exploration of L5-S1 fusion.  3. Revision L4-5 lumbar laminectomy with wide decompression of both the L4      and L5 nerve roots bilaterally.  4. Posterior spinal arthrodesis L4 through S1.  5. Transforaminal lumbar interbody fusion L4-5 from the right side with      placement of PEEK cage.  6. Segmental pedicle screw instrumentation L4-S1 using the Abbott Spine      Titanium System.  7. Local autogenous bone graft supplemented with bone morphogenic protein      and 5 mL of Grafton allograft.   SURGEON:  Sharolyn Douglas, MD.   ASSISTANT:  Verlin Fester, P.A.   ANESTHESIA:  General endotracheal.   ESTIMATED BLOOD LOSS:  400 mL.   COMPLICATIONS:  None.   Needle and sponge count correct.   INDICATIONS:  The patient is a pleasant 45 year old female who is status  post previous L5-S1 decompression and fusion 5 years ago.  She did well for  some time.  She then redeveloped severe back and bilateral lower extremity  pain.  Her radiographs show a degenerative spondylolisthesis above the  previous fusion.  MRI scans have shown progressive spinal stenosis.  She has  failed to respond to all conservative treatment measures and at this time  elected to undergo exploration of the previous fusion, removal of the  instrumentation and extension across the L4-5 level.  Risks and benefits  were reviewed.  She has elected to proceed.   PROCEDURE:  The patient was properly identified in the holding.  Area after  informed consent, she was taken to the operating room.  She underwent  general endotracheal anesthesia without difficulty, given prophylactic IV  antibiotics.  Monitoring was established in the form of SSEPs and lower  extremity EMGs.  She was then turned prone onto the [place] frame.  All bony  prominences padded.  Face and eyes protected at all times.  Back prepped and  draped in the usual sterile fashion.  Previous midline incision was utilized  and extended several centimeters proximally.  Dissection was carried through  the deep fascia and scar.  The patient had a very deep adipose layer.  Dissection was carried out to the tips of the transverse processes of L4 and  to the fusion mass and pedicle screws.  The screws were then removed using  the appropriate drivers, by first removing the locking caps, then taking out  the rods and then backing screws.  The screws were well fixed in L5 but  appeared to be somewhat loose in the sacrum.  There was no abnormal wear  debris noted.  Further exploration of the posterolateral gutters did not  reveal any fusion mass.  Although I could not demonstrate any definite  motion occurring at the L5-S1 segment, I was concerned that there was a  possible pseudoarthrosis at least in the posterolateral portion.  At that  time it was elected to reinstrument this segment and also extend the  posterolateral fusion down to the sacrum.  We then turned our attention to  performing a revision laminectomy.  The edges of the previous laminectomy  were identified.  Loupes and headlight magnification were utilized.  Curettes were used to carefully dissect the epidural fibrosis from the edges  of the laminectomy underneath the remaining L4 lamina.  Using the high-speed  bur along with Kerrison punches, the laminectomy was then extended  proximally  removing the inferior two thirds of the L4 lamina and spinous  process.  The decompression was carried laterally identifying the L4 and L5  nerve roots.  The L5 roots were scarred distally much worse on the right  side than the left.  There was underlying spinal stenosis due to ligamentum  flavum hypertrophy as well as enlargement of the facette joints.  On the  right side there was a cyst coming off of the joint itself and compressing  the L5 nerve root and the thecal sac.  The joints were unstable and widened.  Once we were satisfied that the L4-L5 nerve roots were decompressed, we  turned our attention to placing pedicle screws at L4, L5 and S1 bilaterally.  We used anatomic probing technique at L4 identifying the starting point,  then using an awl and a pedicle probe to cannulate the pedicle.  The pedicle  was palpated.  There were no breeches.  The pedicle was tapped with a 6 tap  and then a 6.5 x 50 mm screw bilaterally was placed into the L4 pedicle with  excellent purchase.  We then turned our attention to placing screws into L5  and S1.  The holes were palpated.  There were no breeches.  At L5 we tapped  with a 7 tap and then placed a 7.5 x 45 mm screw, again with good purchase.  The sacrum was tapped with a 8 tap and 8.5 x 40 mm screws were placed  bilaterally with reasonable screw purchase, although not as strong as the L4  and L5 segments.  We then turned our attention to performing a  transforaminal lumbar interbody fusion at L4-5 on the right side.  The  remaining facette joint was osteotomized.  The L4 and L5 nerve roots were  found to be scarred and adherent to the joint.  This was carefully dissected  free.  There were large epidural bleeders which were coagulated with the  bipolar.  The disk space was identified, free running EMGs were monitored.  The disk was entered and a radical diskectomy was completed.  The cartilaginous endplates were scraped clean.  The disk was  dilated up to 9  mm.  Distraction was applied using the previously placed pedicle screws.  The disk space was packed with local bone graft along with BMP sponges.  A 9  mm PEEK cage was packed with the BMP sponge and inserted into the  interspace, tamped anteriorly and across the midline.  There were no changes  in the free running EMGs  throughout the procedure.  The posterior spinal  fusion was then completed by decorticating the transverse processes of L4,  L5 and sacral ala bilaterally.  The remaining local bone graft along with 5  mL of Grafton allograft was packed into the lateral gutters from L4 down to  the sacrum.  Titanium rods were then placed into the polyaxial screw heads  and compression was applied before shearing off the locking caps..  The  wound was irrigated.  Hemostasis was achieved.  Gelfoam was left over the  exposed epidural space.  There was any area over the nerve root sleeve on  the right side of the L5 root which was thin at the location of the facette  cyst.  There was no active CSF leak but we elected to place Tisseel fibrin  glue over this area in order to reinforce.  The deep fascia was closed with  a running #1 Vicryl suture.  Subcutaneous layer was closed with 0 Vicryl and  2-0 Vicryl followed by a running 3-0 subcuticular Vicryl suture on the skin  edges.  Benzoin and Steri-Strips placed.  Sterile dressing applied.  The  patient was turned supine, extubated without difficulty and transferred to  recovery in stable condition.  It should be noted that my assistant, Verlin Fester, P.A., was present throughout the procedure including the positioning,  the exposure, the decompression, the instrumentation, the fusion and also  assisted with the entire wound closure.  X-ray was taken before leaving the  operating room which showed good position of the pedicle screws and  interbody PEEK spacer.     Sharolyn Douglas, M.D.  Electronically Signed    MC/MEDQ  D:   12/09/2005  T:  12/10/2005  Job:  [id number]

## 2010-09-04 NOTE — Procedures (Signed)
NAME:  [name] [name], [name] [name]                     ACCOUNT NO.:  [id number]   MEDICAL RECORD NO.:  [id number]                   PATIENT TYPE:  REC   LOCATION:  TPC                                  FACILITY:  [facility]   PHYSICIAN:  Celene Kras, MD                     DATE OF BIRTH:  09/01/1965   DATE OF PROCEDURE:  DATE OF DISCHARGE:                                 OPERATIVE REPORT   [name] [name] [facility] her  __________  14-point review of systems.   I reviewed the chart, available imaging and medical reports.  1. It is reasonable to go ahead and proceed with lumbar sympathetic block,     both as diagnostic and as therapeutic.  We plan left side.  I will see     her back in a week and predicate second injection based on need and     response.  She has been classically described as sympathetically mediated     pain, but she does have some modest discoloration and a cool foot.  2. She leads a sedentary lifestyle, and this has to be modified.  She needs     to loose 100 pounds.  I asked her to see her primary care physician in     this regard, and this is clearly a best outcome position.  3. Other lifestyle enhancements are reviewed.  I do not think she is a long-     term disability candidate.  She is a young and very vital individual, and     I would hope that she could return to some type of vocational retraining,     and lifestyle enhancements.   OBJECTIVE:  Diffuse paralumbar myofascial discomfort pain over PSIS.  Her  left foot is a bit cool, but no obvious pseudomotor changes.  Adequate  capillary filling.  Vascular integrity intact.   IMPRESSION:  1. 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  2. 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   PLAN:  Lumbar sympathetic block, left side.  She has consented.   The patient was taken to the fluoroscopy suite and placed in the prone  position.  Back prepped and draped in the usual fashion.   Using a 22 gauge  spinal needle under direct fluoroscopic observation, blunted, I advanced to  the anterior lateral surface of L2 and confirmed placement in multiple  fluoroscopic positions.  I used Isovue 200.  I then injected test dose  uneventfully and followed with 5 cc of Lidocaine and 1% MPF and 40 mg of  Aristocort.  She tolerated this procedure well.  No complications from this  procedure.  Will assess within the context of activities of daily living.  In recovery phase, she had adequate response to block, as expected from a  sympathetic blockade of the lumbar sympathetic chain.  Discharge  instructions given.  Celene Kras, MD    HH/MEDQ  D:  06/04/2003 09:17:14  T:  06/04/2003 11:01:34  Job:  [id number]

## 2010-09-04 NOTE — Discharge Summary (Signed)
NAME:  [name] [name], [name] [name]           ACCOUNT NO.:  [id number]   MEDICAL RECORD NO.:  [id number]          PATIENT TYPE:  IPS   LOCATION:  0506                          [facility]  [facility]   PHYSICIAN:  Anselm Jungling, MD  DATE OF BIRTH:  Dec 21, 1965   DATE OF ADMISSION:  07/17/2005  DATE OF DISCHARGE:  07/28/2005                                 DISCHARGE SUMMARY   IDENTIFICATION AND REASON FOR ADMISSION:  This was a voluntary admission to  [facility] for this 45 year old divorced white female.  She came to [place] with a long  history of bipolar disorder, most recently severely depressed, and in the  midst of an outpatient course of ECT that she was receiving through Dr.  Marjie Skiff, and at [facility].   She had a crisis precipitated by her boyfriend breaking up with her shortly  prior to admission.  She had called an emergency session with her therapist.  The therapist felt that the patient needed to be committed to inpatient  treatment, but the patient apparently left the therapist's office.  She was  met by the police on her way home.  She had indicated that she was planning  to overdose on her medications.  Please refer to the admission note for  further details pertaining to the symptoms, circumstances and history that  lead to hospitalization.  She was given an initial Axis I diagnosis of  bipolar disorder, not otherwise specified, currently depressed, Axis II  possible cluster B personality traits.   MEDICAL AND LABORATORY:  The patient came to [place] with a history of  hypertension, GERD and hyperlipidemia, and obesity.  Her primary care  physician is [facility].  Her usual nonpsychotropic  medications consisted of Cozaar 50 mg daily, Protonix 20 mg daily, and  hydrochlorothiazide 25 mg daily.  She has reported ALLERGIES TO  TETRACYCLINE, BETADINE, MORPHINE, AND CODEINE.  She was medically and  physically evaluated by the psychiatric  nurse practitioner upon  admission.  There were no significant medical issues during her brief inpatient  psychiatric stay.  She was in addition given a Lidoderm 5% patch to her low  back for back pain on a daily basis which she reported to be very effective.  She was also given Claritin 10 mg daily, Ambien 12.5 mg ER q.h.s., Protonix  20 mg daily for GERD, Cozaar 50 mg daily and hydrochlorothiazide 25 mg.  In  addition, she was given Toprol XL 25 mg daily.   HOSPITAL COURSE:  The patient was admitted to the adult inpatient  psychiatric service.  She presented as a normally developed, moderately  obese, adult female who was profoundly depressed, guarded, and made little  eye contact, and gave only brief responses.  She did not exhibit any overt  signs or symptoms of psychosis or thought disorder throughout her inpatient  stay.  She acknowledged suicidal ideation and acknowledged that her crisis  been precipitated by the ending of the relationship with boyfriend.   She was able to give a certain amount of history, specifically, that she was  a patient of Dr. Marjie Skiff At  [facility] and had been having a  course of outpatient ECT, the most recent one week prior to admission.  The  patient was unhappy with being hospitalized in our program, but acknowledges  that she was involuntarily petitioned as a direct result of her various  statements and actions prior to admission.   The patient was continued on her psychotropic regimen of Abilify 10 mg  daily, Wellbutrin 300 mg XL daily, Seroquel 200 mg q.h.s.   The patient was a relatively poor participate in the treatment program, and  she seemed to have little interest or motivation to leave her room, at times  even for meals.  In individual contacts with the patient, I found her to be  consistently guarded, withdrawn, and reluctant to discuss much of anything.   Early on in her hospital stay, we made inquiries to [facility]  about the  possibility of transferring the patient to their care, as she had  been hospitalized there previously and was already under the treatment of  Dr. Marjie Skiff.  We were told on several occasions that [facility] [place] inpatient  psychiatry had no space available, but the patient was put on a waiting  list.  When we made it more clear to [facility] that the patient was in fact in  the midst of a course of ECT and would need continued ECT treatment, there  appeared to be more effort on the part of [facility] to accept her in  transfer, but still this took three to four days additional.   In the meantime, the patient made efforts to scratch herself using her  earrings.  She appeared more acutely suicidal and was placed on one-to-one  observation for the remainder of her inpatient stay.   She was discharged in transfer to [facility] on the 12th  hospital day by ambulance.   AFTERCARE:  As referenced above, the patient is transferred directly to [facility] by ambulance.   DISCHARGE MEDICATIONS:  1.  Abilify 10 mg p.o. q.d.  2.  Wellbutrin 300 mg XL daily.  3.  Seroquel 200 mg q.h.s.  4.  Cozaar 50 mg daily.  5.  Hydrochlorothiazide 25 mg daily.  6.  Toprol XL 25 mg daily.  7.  Lidocaine 5% patch to low back q.12h.  8.  Claritin 10 mg daily.  9.  Ambien CR 12.5 mg daily.  10. Protonix 20 mg daily.   DISCHARGE DIAGNOSES:  AXIS I:  Bipolar disorder, currently depressed.  AXIS II:  Cluster B personality traits.  AXIS III:  History of hypertension, hyperlipidemia, GERD, seasonal  allergies, low back pain.  AXIS IV:  Stressors severe.  AXIS V:  GAF on discharge 45.           ______________________________  Anselm Jungling, MD  Electronically Signed     SPB/MEDQ  D:  07/28/2005  T:  07/28/2005  Job:  [id number]

## 2010-09-07 IMAGING — US US ABDOMEN COMPLETE
1 series · 13 of 25 positions shown · non-contrast
Comparison: None

CLINICAL DATA: Right upper quadrant abdominal pain.

COMPLETE ABDOMINAL ULTRASOUND

[Series 1: us abdomen complete · 0.35mm/px · 13 of 64 slices shown]
[im 1/64]
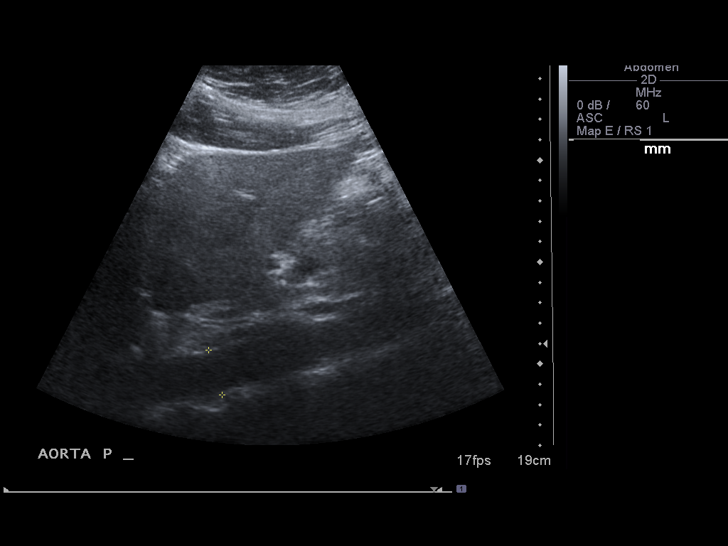
[im 6/64]
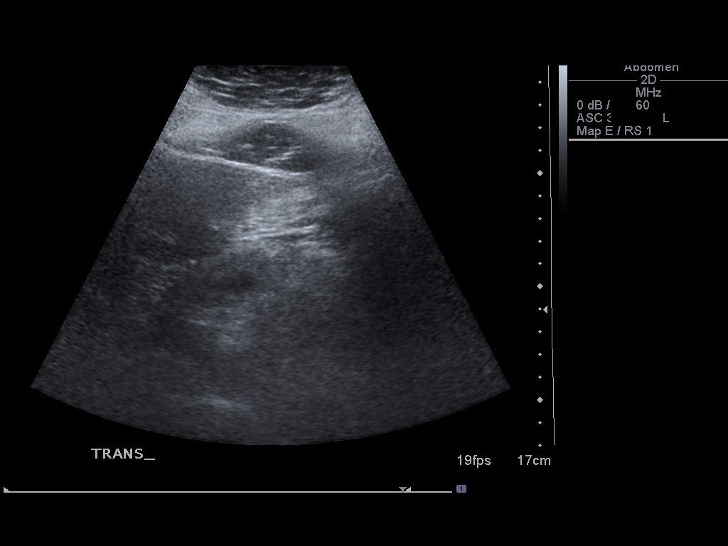
[im 11/64]
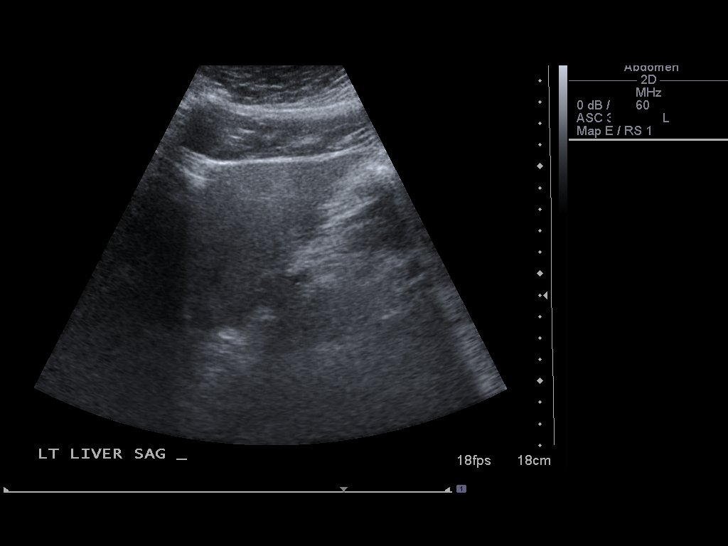
[im 16/64]
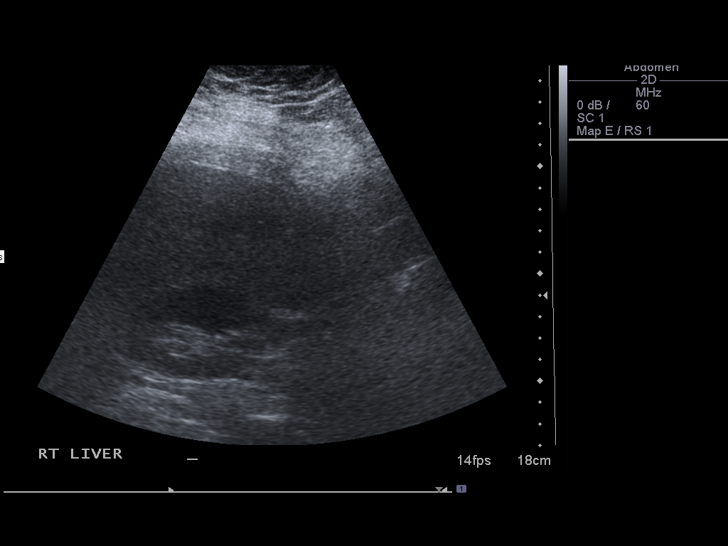
[im 22/64]
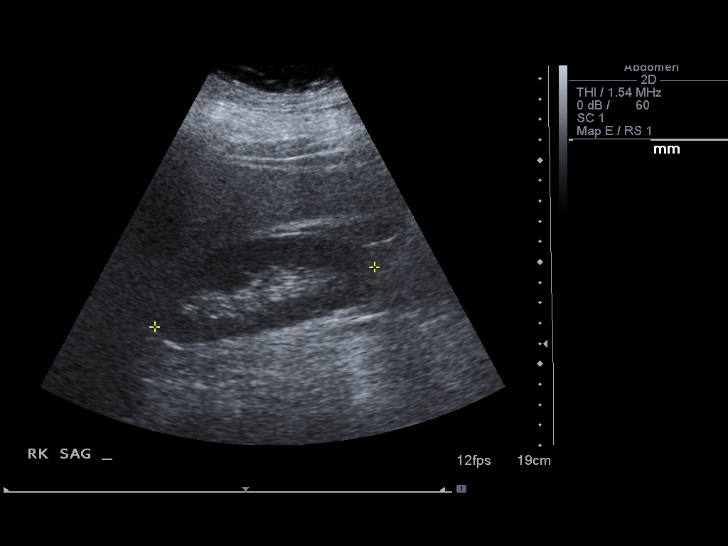
[im 27/64]
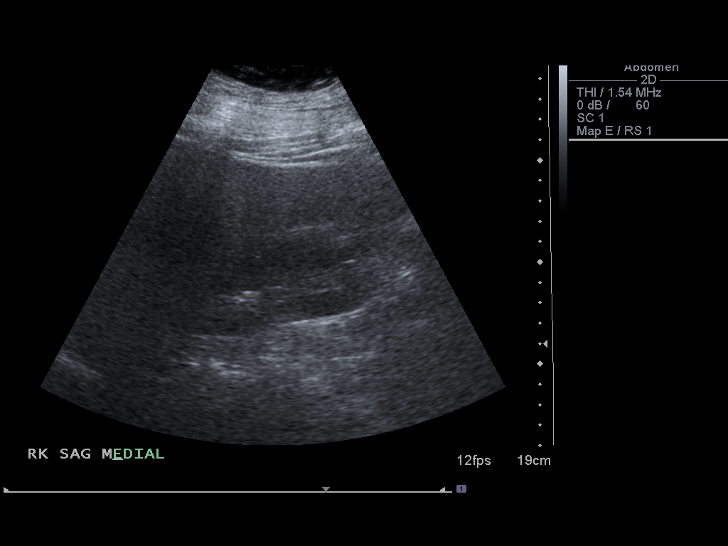
[im 32/64]
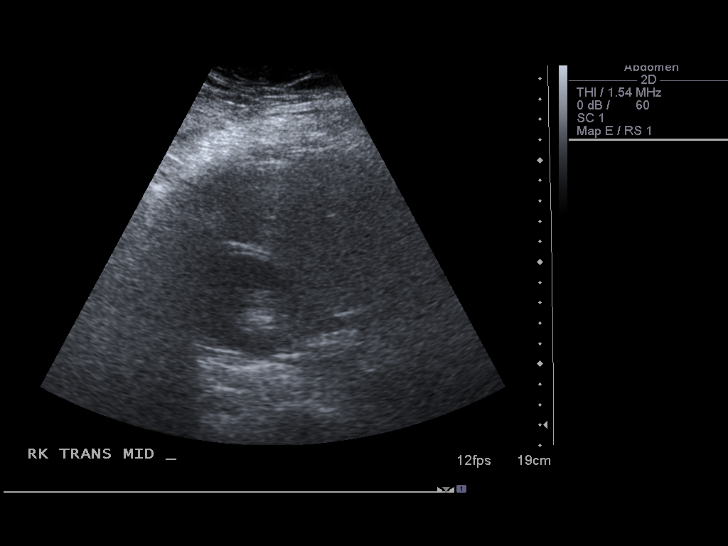
[im 37/64]
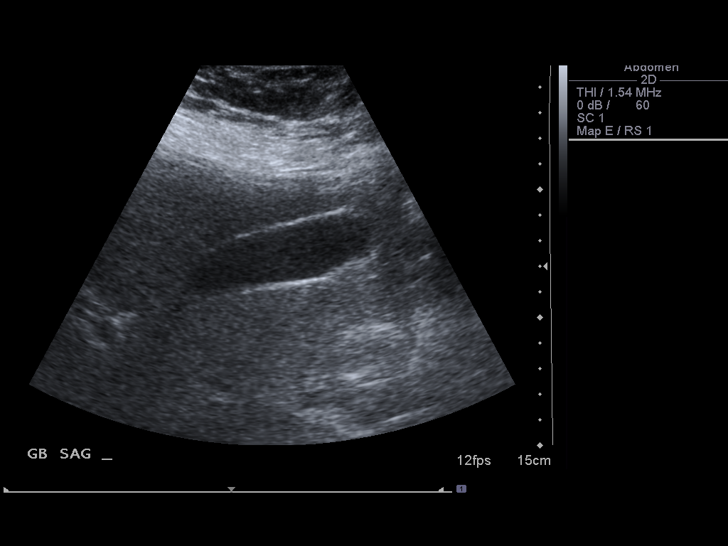
[im 43/64]
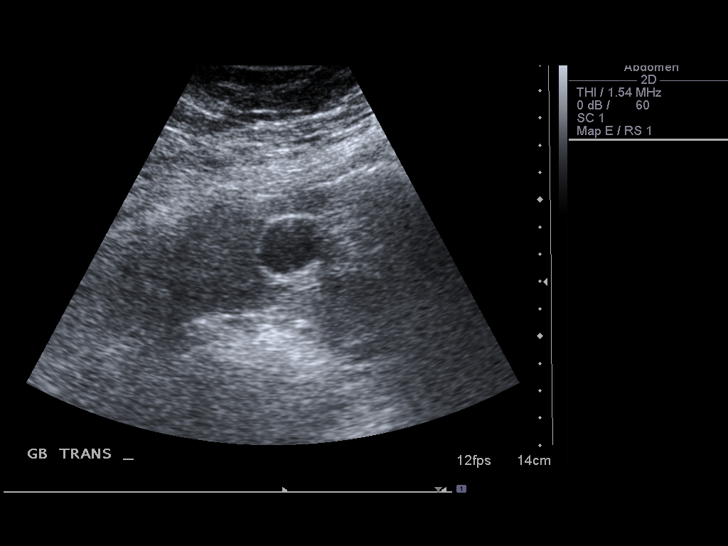
[im 48/64]
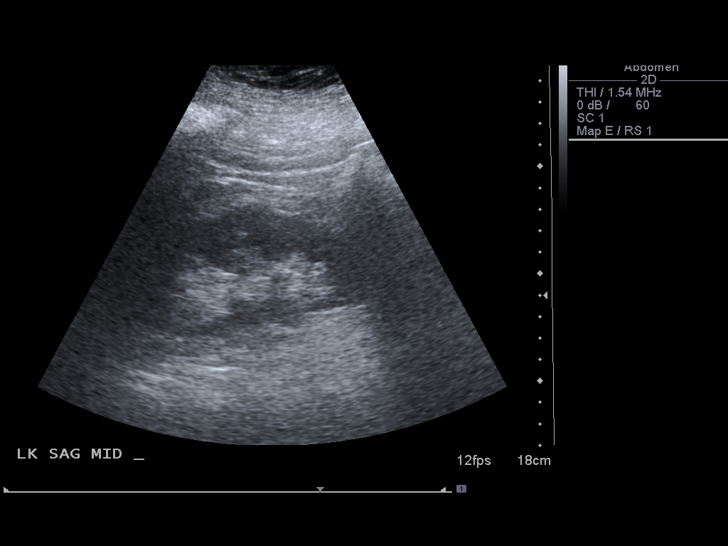
[im 53/64]
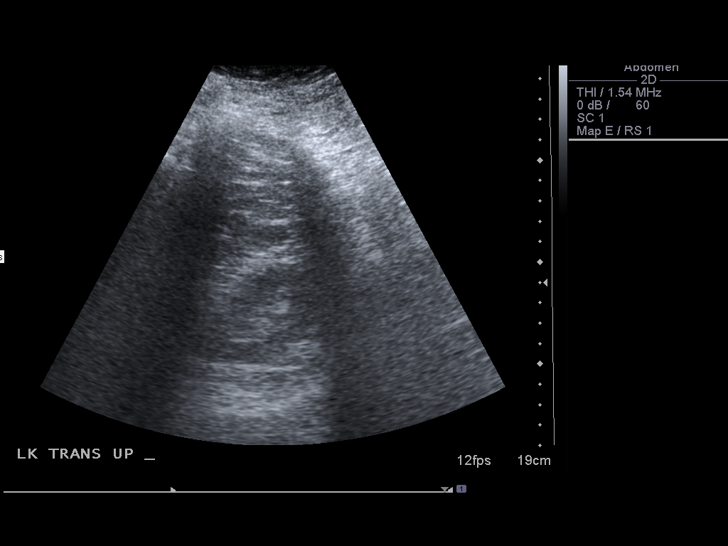
[im 58/64]
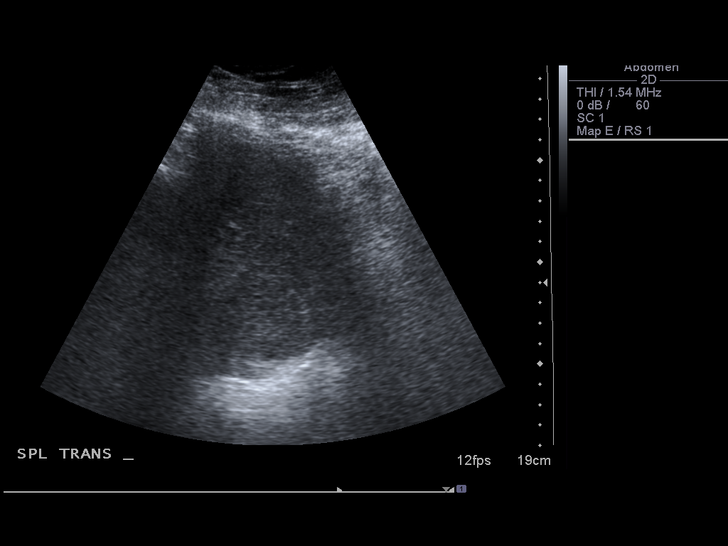
[im 64/64]
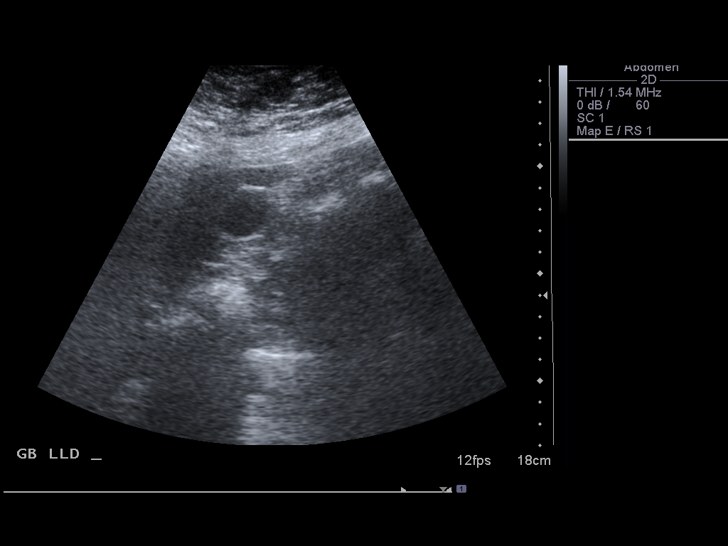

[13 of 25 positions shown; findings below may reference images not displayed]

FINDINGS: Gallbladder:  No gallstones, gallbladder wall thickening, or
pericholecystic fluid.

Common bile duct:  Measures within normal limits in caliber
measuring a maximum of 4.1 mm.

Liver:  Diffuse increased echogenicity with poor through
transmission and poor definition of the liver architecture
suggesting diffuse fatty infiltration.  No obvious mass lesion or
intrahepatic biliary dilatation.

IVC:  Normal in caliber.

Pancreas:  The body and tail regions are not well visualized.  The
head appears normal.

Spleen:  Normal in size and echogenicity without focal lesions.

Right Kidney:  11 point tumor centimeters in length. Normal renal
cortical thickness and echogenicity without focal lesions or
hydronephrosis.

Left Kidney:  11.1 cm in length. Normal renal cortical thickness
and echogenicity without focal lesions or hydronephrosis.

Abdominal aorta:  Normal in caliber.
IMPRESSION: 1.  Diffuse fatty infiltration of the liver.
2.  Normal sonographic appearance of the gallbladder and normal
caliber common bile duct.
3.  Limited visualization of the pancreas.
4.  Normal sonographic appearance of the spleen and both kidneys.

## 2010-09-08 IMAGING — CT CT ABD-PELV W/ CM
2 of 5 series · 17 of 46 positions shown, 19 images · IV contrast (omnipaque)
Comparison: None available.

CLINICAL DATA: Syncope.  Right upper quadrant pain.  Constipation.

CT ABDOMEN AND PELVIS WITH CONTRAST
TECHNIQUE: Multidetector CT imaging of the abdomen and pelvis was
performed following the standard protocol during bolus
administration of intravenous contrast.
Contrast: 100 ml Omnipaque 300

[Series 2: rtn a/p with · axial · 0.70mm/px · z∈[-689,-289]mm · 14 of 92 slices shown, 16 images]
[im 6/92  soft-tissue]
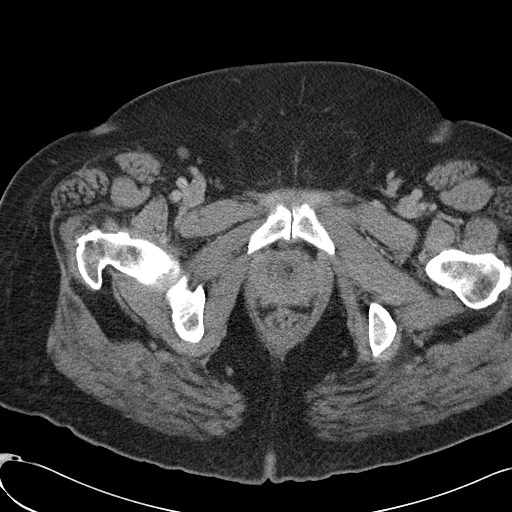
[im 6/92  bone]
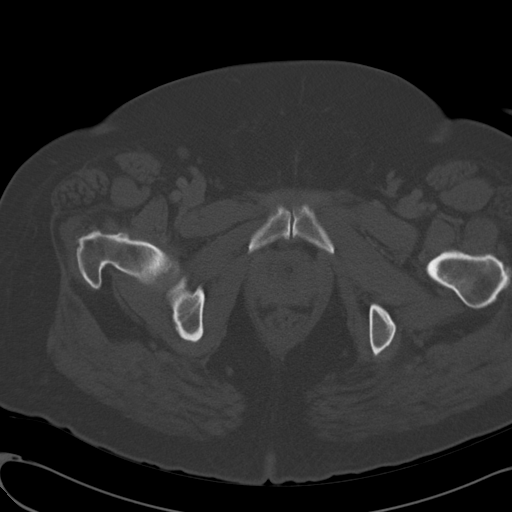
[im 11/92  soft-tissue]
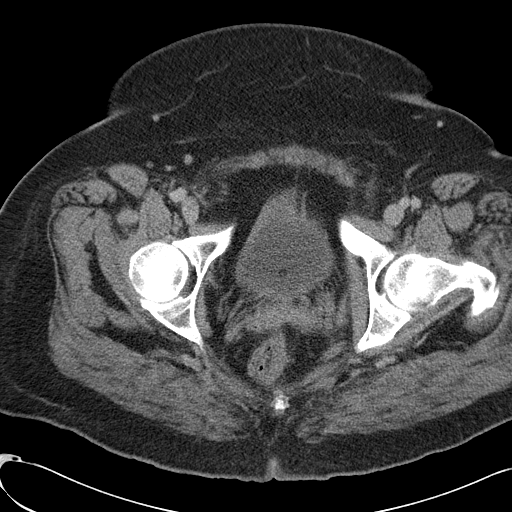
[im 21/92  soft-tissue]
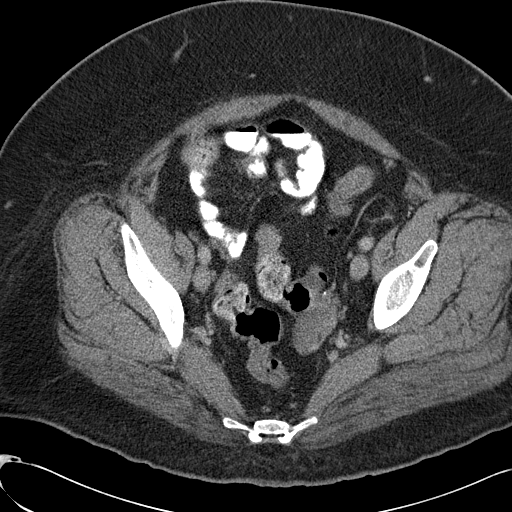
[im 26/92  soft-tissue]
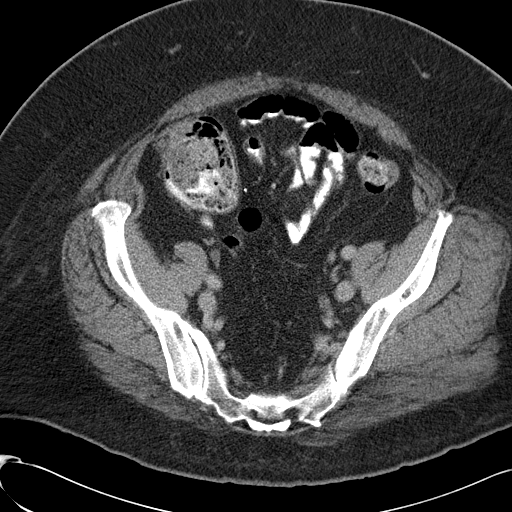
[im 31/92  soft-tissue]
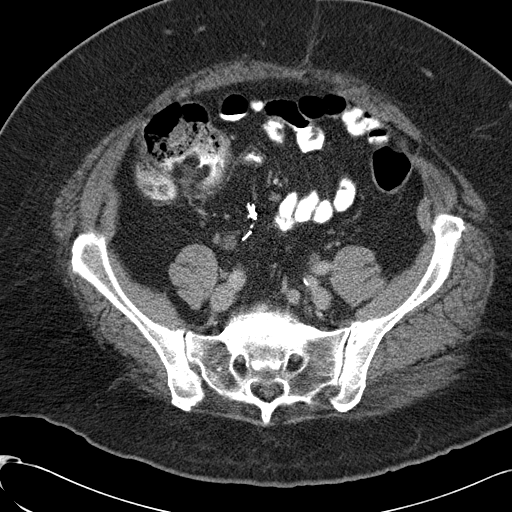
[im 36/92  soft-tissue]
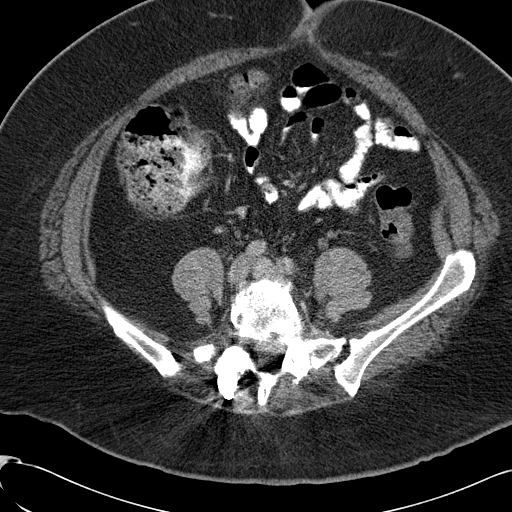
[im 41/92  soft-tissue]
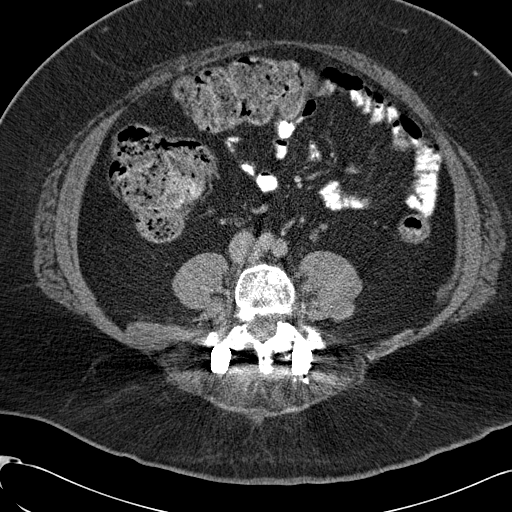
[im 51/92  soft-tissue]
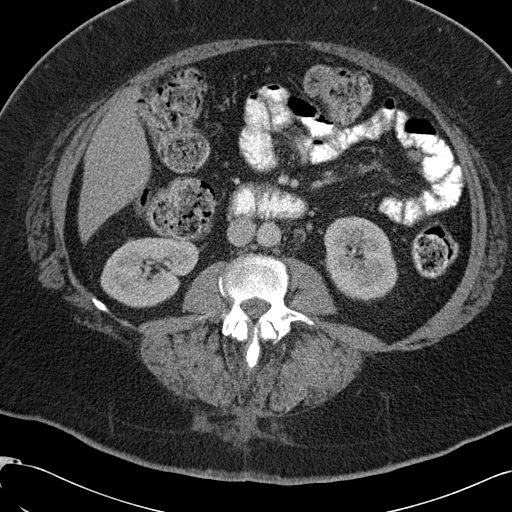
[im 56/92  soft-tissue]
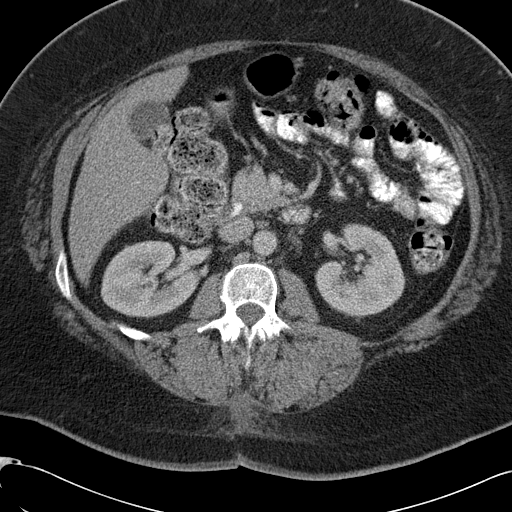
[im 56/92  bone]
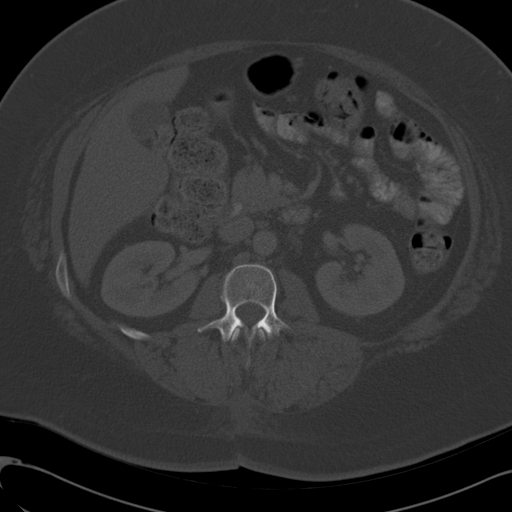
[im 61/92  soft-tissue]
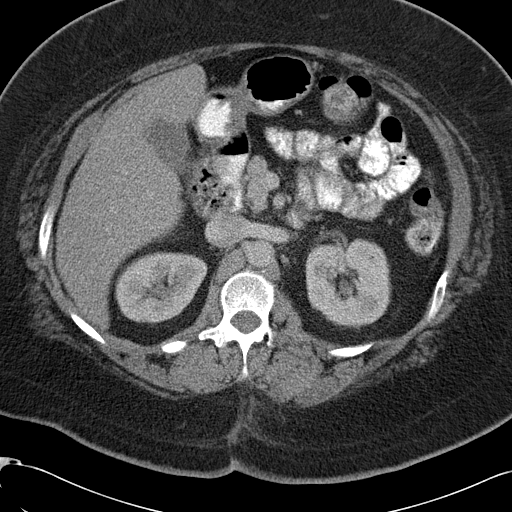
[im 66/92  soft-tissue]
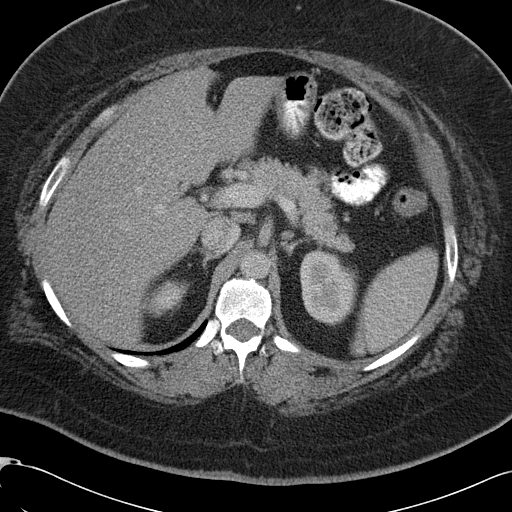
[im 71/92  soft-tissue]
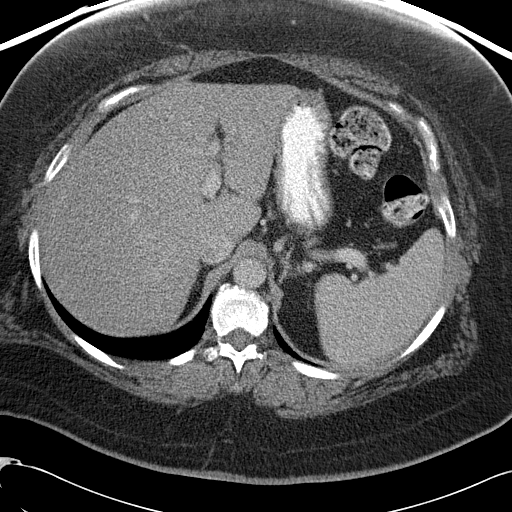
[im 81/92  soft-tissue]
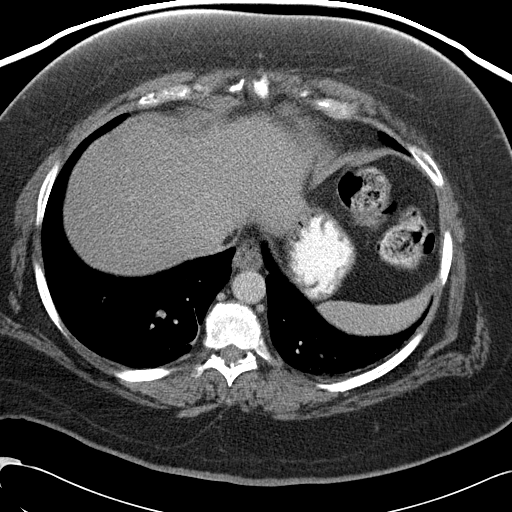
[im 86/92  soft-tissue]
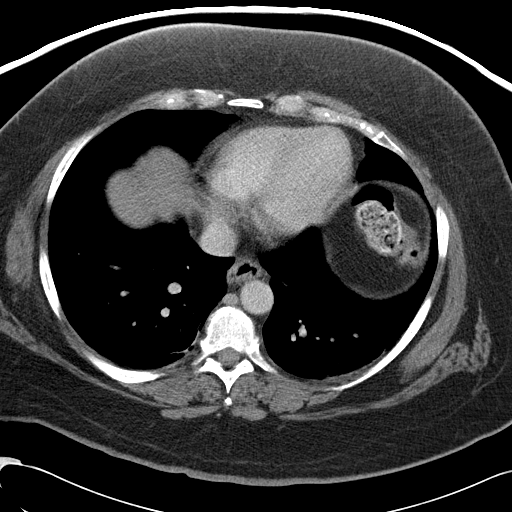

[Series 602: <mpr thick range> · coronal · 0.90mm/px · 3 of 96 slices shown]
[im 32/96  soft-tissue]
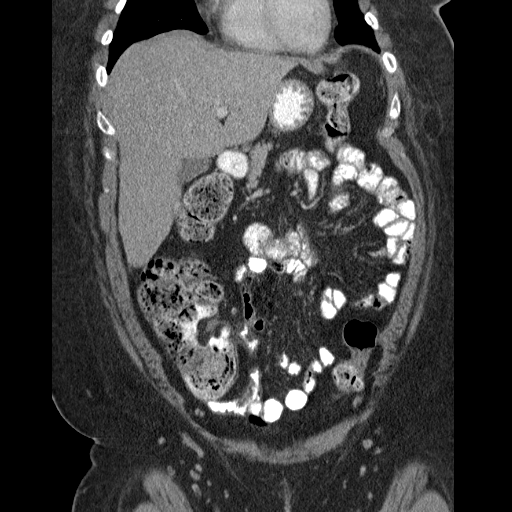
[im 43/96  soft-tissue]
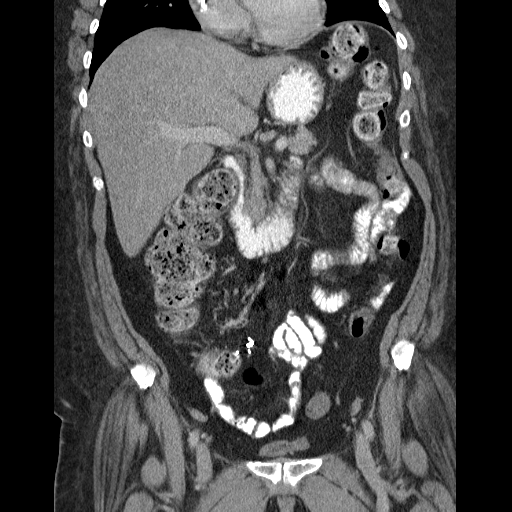
[im 53/96  soft-tissue]
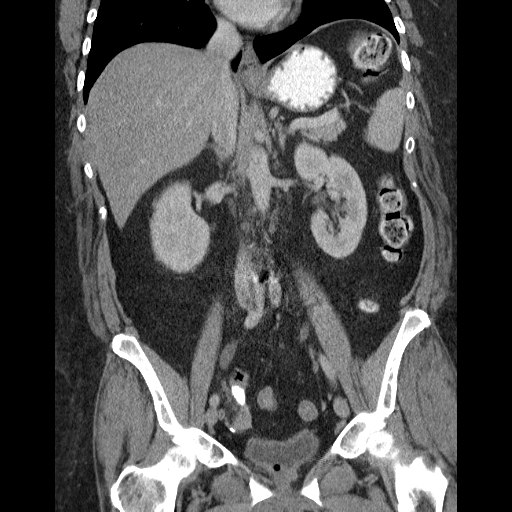

[17 of 46 positions shown; findings below may reference images not displayed]

FINDINGS: Mild dependent atelectasis is present at the lung bases
bilaterally.  The heart size is normal.  No significant pleural or
pericardial effusion is present.

Fatty infiltration of the liver is suspected.  No focal hepatic
lesions are present.  The spleen is unremarkable.  The stomach,
duodenum, and pancreas are normal.  The common bile duct and
gallbladder are normal.  The adrenal glands and kidneys are
unremarkable.  Ureters are within normal limits bilaterally.  A
Foley catheter is present in the urinary bladder which is mostly
collapsed.

The rectosigmoid colon is mostly collapsed.  The remainder of the
colon is normal.  Surgical clips are present at the cecum,
compatible with appendectomy.  The small bowel is normal.  No
significant free fluid is present.  No significant retroperitoneal
adenopathy is evident.

The patient is status post L4-5 and L5-S1 PLIF with slight
retrolisthesis at L3-4, likely reflecting developing adjacent level
disease.  The bone windows are otherwise unremarkable.
IMPRESSION: 1.  Probable fatty infiltration of the liver.
2.  No other acute abnormality of the right upper quadrant.
3.  Status post lower lumbar fusion with slight retrolisthesis of
the adjacent level, L3-4.

## 2010-09-24 ENCOUNTER — Inpatient Hospital Stay (INDEPENDENT_AMBULATORY_CARE_PROVIDER_SITE_OTHER)
Admission: RE | Admit: 2010-09-24 | Discharge: 2010-09-24 | Disposition: A | Discharge status: Home / Self Care | Payer: Medicare Other | Source: Ambulatory Visit | Attending: Emergency Medicine | Admitting: Emergency Medicine

## 2010-09-24 ENCOUNTER — Emergency Department (HOSPITAL_COMMUNITY)
Admission: EM | Admit: 2010-09-24 | Discharge: 2010-09-24 | Disposition: A | Discharge status: Home / Self Care | Payer: Medicare Other | Attending: Emergency Medicine | Admitting: Emergency Medicine

## 2010-09-24 ENCOUNTER — Emergency Department (HOSPITAL_COMMUNITY): Payer: Medicare Other

## 2010-09-24 DIAGNOSIS — Z79899 Other long term (current) drug therapy: Secondary | ICD-10-CM | POA: Insufficient documentation

## 2010-09-24 DIAGNOSIS — J45909 Unspecified asthma, uncomplicated: Secondary | ICD-10-CM | POA: Insufficient documentation

## 2010-09-24 DIAGNOSIS — W19XXXA Unspecified fall, initial encounter: Secondary | ICD-10-CM

## 2010-09-24 DIAGNOSIS — K219 Gastro-esophageal reflux disease without esophagitis: Secondary | ICD-10-CM | POA: Insufficient documentation

## 2010-09-24 DIAGNOSIS — R42 Dizziness and giddiness: Secondary | ICD-10-CM | POA: Insufficient documentation

## 2010-09-24 DIAGNOSIS — R112 Nausea with vomiting, unspecified: Secondary | ICD-10-CM | POA: Insufficient documentation

## 2010-09-24 DIAGNOSIS — R197 Diarrhea, unspecified: Secondary | ICD-10-CM | POA: Insufficient documentation

## 2010-09-24 DIAGNOSIS — R55 Syncope and collapse: Secondary | ICD-10-CM | POA: Insufficient documentation

## 2010-09-24 DIAGNOSIS — R109 Unspecified abdominal pain: Secondary | ICD-10-CM | POA: Insufficient documentation

## 2010-09-24 DIAGNOSIS — M549 Dorsalgia, unspecified: Secondary | ICD-10-CM | POA: Insufficient documentation

## 2010-09-24 DIAGNOSIS — R1031 Right lower quadrant pain: Secondary | ICD-10-CM

## 2010-09-24 DIAGNOSIS — G8929 Other chronic pain: Secondary | ICD-10-CM | POA: Insufficient documentation

## 2010-09-24 LAB — CBC
HCT: 36.7 % (ref 36.0–46.0)
Hemoglobin: 12.3 g/dL (ref 12.0–15.0)
MCH: 31.1 pg (ref 26.0–34.0)
MCHC: 33.5 g/dL (ref 30.0–36.0)
MCV: 92.9 fL (ref 78.0–100.0)
Platelets: 317 10*3/uL (ref 150–400)
RBC: 3.95 MIL/uL (ref 3.87–5.11)
RDW: 14.3 % (ref 11.5–15.5)
WBC: 9.6 10*3/uL (ref 4.0–10.5)

## 2010-09-24 LAB — COMPREHENSIVE METABOLIC PANEL
ALT: 24 U/L (ref 0–35)
AST: 18 U/L (ref 0–37)
Albumin: 3.3 g/dL — ABNORMAL LOW (ref 3.5–5.2)
Alkaline Phosphatase: 82 U/L (ref 39–117)
BUN: 12 mg/dL (ref 6–23)
CO2: 26 mEq/L (ref 19–32)
Calcium: 8.9 mg/dL (ref 8.4–10.5)
Chloride: 102 mEq/L (ref 96–112)
Creatinine, Ser: 0.62 mg/dL (ref 0.4–1.2)
GFR calc Af Amer: >60 mL/min (ref >60)
GFR calc non Af Amer: >60 mL/min (ref >60)
Glucose, Bld: 85 mg/dL (ref 70–99)
Potassium: 3.5 mEq/L (ref 3.5–5.1)
Sodium: 135 mEq/L (ref 135–145)
Total Bilirubin: 0.2 mg/dL — ABNORMAL LOW (ref 0.3–1.2)
Total Protein: 6.8 g/dL (ref 6.0–8.3)

## 2010-09-24 LAB — POCT I-STAT, CHEM 8
BUN: 13 mg/dL (ref 6–23)
Calcium, Ion: 1.16 mmol/L (ref 1.12–1.32)
Chloride: 106 mEq/L (ref 96–112)
Creatinine, Ser: 0.9 mg/dL (ref 0.4–1.2)
Glucose, Bld: 89 mg/dL (ref 70–99)
HCT: 41 % (ref 36.0–46.0)
Hemoglobin: 13.9 g/dL (ref 12.0–15.0)
Potassium: 3.7 mEq/L (ref 3.5–5.1)
Sodium: 136 mEq/L (ref 135–145)
TCO2: 21 mmol/L (ref 0–100)

## 2010-09-24 LAB — DIFFERENTIAL
Basophils Absolute: 0 10*3/uL (ref 0.0–0.1)
Basophils Relative: 0 % (ref 0–1)
Eosinophils Absolute: 0.2 10*3/uL (ref 0.0–0.7)
Eosinophils Relative: 2 % (ref 0–5)
Lymphocytes Relative: 30 % (ref 12–46)
Lymphs Abs: 2.9 10*3/uL (ref 0.7–4.0)
Monocytes Absolute: 0.4 10*3/uL (ref 0.1–1.0)
Monocytes Relative: 5 % (ref 3–12)
Neutro Abs: 6.1 10*3/uL (ref 1.7–7.7)
Neutrophils Relative %: 63 % (ref 43–77)

## 2010-09-25 LAB — GLUCOSE, CAPILLARY: Glucose-Capillary: 91 mg/dL (ref 70–99)

## 2010-10-01 ENCOUNTER — Other Ambulatory Visit: Payer: Self-pay | Admitting: Family Medicine

## 2010-10-01 DIAGNOSIS — R1011 Right upper quadrant pain: Secondary | ICD-10-CM

## 2010-10-02 ENCOUNTER — Ambulatory Visit
Admission: RE | Admit: 2010-10-02 | Discharge: 2010-10-02 | Disposition: A | Discharge status: Home / Self Care | Payer: Medicare Other | Source: Ambulatory Visit | Attending: Family Medicine | Admitting: Family Medicine

## 2010-10-02 DIAGNOSIS — R1011 Right upper quadrant pain: Secondary | ICD-10-CM

## 2010-10-03 ENCOUNTER — Emergency Department (HOSPITAL_COMMUNITY)
Admission: EM | Admit: 2010-10-03 | Discharge: 2010-10-03 | Disposition: A | Discharge status: Home / Self Care | Payer: Medicare Other | Attending: Emergency Medicine | Admitting: Emergency Medicine

## 2010-10-03 DIAGNOSIS — Z79899 Other long term (current) drug therapy: Secondary | ICD-10-CM | POA: Insufficient documentation

## 2010-10-03 DIAGNOSIS — E669 Obesity, unspecified: Secondary | ICD-10-CM | POA: Insufficient documentation

## 2010-10-03 DIAGNOSIS — M549 Dorsalgia, unspecified: Secondary | ICD-10-CM | POA: Insufficient documentation

## 2010-10-03 DIAGNOSIS — R112 Nausea with vomiting, unspecified: Secondary | ICD-10-CM | POA: Insufficient documentation

## 2010-10-03 DIAGNOSIS — J45909 Unspecified asthma, uncomplicated: Secondary | ICD-10-CM | POA: Insufficient documentation

## 2010-10-03 DIAGNOSIS — R1011 Right upper quadrant pain: Secondary | ICD-10-CM | POA: Insufficient documentation

## 2010-10-03 DIAGNOSIS — K219 Gastro-esophageal reflux disease without esophagitis: Secondary | ICD-10-CM | POA: Insufficient documentation

## 2010-10-03 DIAGNOSIS — G8929 Other chronic pain: Secondary | ICD-10-CM | POA: Insufficient documentation

## 2010-10-03 LAB — URINALYSIS, ROUTINE W REFLEX MICROSCOPIC
Bilirubin Urine: NEGATIVE
Glucose, UA: NEGATIVE mg/dL
Hgb urine dipstick: NEGATIVE
Ketones, ur: NEGATIVE mg/dL
Leukocytes, UA: NEGATIVE
Nitrite: NEGATIVE
Protein, ur: NEGATIVE mg/dL
Specific Gravity, Urine: 1.003 — ABNORMAL LOW (ref 1.005–1.030)
Urobilinogen, UA: 0.2 mg/dL (ref 0.0–1.0)
pH: 7 (ref 5.0–8.0)

## 2010-10-03 LAB — POCT PREGNANCY, URINE: Preg Test, Ur: NEGATIVE

## 2010-10-03 LAB — CBC
HCT: 35.4 % — ABNORMAL LOW (ref 36.0–46.0)
Hemoglobin: 11.9 g/dL — ABNORMAL LOW (ref 12.0–15.0)
MCH: 31.2 pg (ref 26.0–34.0)
MCHC: 33.6 g/dL (ref 30.0–36.0)
MCV: 92.7 fL (ref 78.0–100.0)
Platelets: 314 10*3/uL (ref 150–400)
RBC: 3.82 MIL/uL — ABNORMAL LOW (ref 3.87–5.11)
RDW: 14.1 % (ref 11.5–15.5)
WBC: 9 10*3/uL (ref 4.0–10.5)

## 2010-10-03 LAB — DIFFERENTIAL
Basophils Absolute: 0 10*3/uL (ref 0.0–0.1)
Basophils Relative: 0 % (ref 0–1)
Eosinophils Absolute: 0.2 10*3/uL (ref 0.0–0.7)
Eosinophils Relative: 2 % (ref 0–5)
Lymphocytes Relative: 37 % (ref 12–46)
Lymphs Abs: 3.4 10*3/uL (ref 0.7–4.0)
Monocytes Absolute: 0.4 10*3/uL (ref 0.1–1.0)
Monocytes Relative: 4 % (ref 3–12)
Neutro Abs: 5.1 10*3/uL (ref 1.7–7.7)
Neutrophils Relative %: 56 % (ref 43–77)

## 2010-10-03 LAB — COMPREHENSIVE METABOLIC PANEL
ALT: 24 U/L (ref 0–35)
AST: 20 U/L (ref 0–37)
Albumin: 3.5 g/dL (ref 3.5–5.2)
Alkaline Phosphatase: 70 U/L (ref 39–117)
BUN: 9 mg/dL (ref 6–23)
CO2: 28 mEq/L (ref 19–32)
Calcium: 9.3 mg/dL (ref 8.4–10.5)
Chloride: 100 mEq/L (ref 96–112)
Creatinine, Ser: 0.49 mg/dL — ABNORMAL LOW (ref 0.50–1.10)
GFR calc Af Amer: >60 mL/min (ref >60)
GFR calc non Af Amer: >60 mL/min (ref >60)
Glucose, Bld: 80 mg/dL (ref 70–99)
Potassium: 3.6 mEq/L (ref 3.5–5.1)
Sodium: 137 mEq/L (ref 135–145)
Total Bilirubin: 0.2 mg/dL — ABNORMAL LOW (ref 0.3–1.2)
Total Protein: 7.1 g/dL (ref 6.0–8.3)

## 2010-10-03 LAB — LIPASE, BLOOD: Lipase: 22 U/L (ref 11–59)

## 2010-10-07 ENCOUNTER — Emergency Department (HOSPITAL_COMMUNITY): Payer: Medicare Other

## 2010-10-07 ENCOUNTER — Emergency Department (HOSPITAL_COMMUNITY)
Admission: EM | Admit: 2010-10-07 | Discharge: 2010-10-07 | Disposition: A | Discharge status: Home / Self Care | Payer: Medicare Other | Attending: Emergency Medicine | Admitting: Emergency Medicine

## 2010-10-07 ENCOUNTER — Encounter (HOSPITAL_COMMUNITY): Payer: Self-pay

## 2010-10-07 DIAGNOSIS — R55 Syncope and collapse: Secondary | ICD-10-CM | POA: Insufficient documentation

## 2010-10-07 DIAGNOSIS — K5900 Constipation, unspecified: Secondary | ICD-10-CM | POA: Insufficient documentation

## 2010-10-07 DIAGNOSIS — R109 Unspecified abdominal pain: Secondary | ICD-10-CM | POA: Insufficient documentation

## 2010-10-07 LAB — URINALYSIS, ROUTINE W REFLEX MICROSCOPIC
Bilirubin Urine: NEGATIVE
Glucose, UA: NEGATIVE mg/dL
Hgb urine dipstick: NEGATIVE
Ketones, ur: NEGATIVE mg/dL
Leukocytes, UA: NEGATIVE
Nitrite: NEGATIVE
Protein, ur: NEGATIVE mg/dL
Specific Gravity, Urine: 1.014 (ref 1.005–1.030)
Urobilinogen, UA: 0.2 mg/dL (ref 0.0–1.0)
pH: 5.5 (ref 5.0–8.0)

## 2010-10-07 LAB — CBC
HCT: 33.7 % — ABNORMAL LOW (ref 36.0–46.0)
Hemoglobin: 11.1 g/dL — ABNORMAL LOW (ref 12.0–15.0)
MCH: 30.6 pg (ref 26.0–34.0)
MCHC: 32.9 g/dL (ref 30.0–36.0)
MCV: 92.8 fL (ref 78.0–100.0)
Platelets: 283 10*3/uL (ref 150–400)
RBC: 3.63 MIL/uL — ABNORMAL LOW (ref 3.87–5.11)
RDW: 14.3 % (ref 11.5–15.5)
WBC: 7.7 10*3/uL (ref 4.0–10.5)

## 2010-10-07 LAB — COMPREHENSIVE METABOLIC PANEL
ALT: 20 U/L (ref 0–35)
AST: 15 U/L (ref 0–37)
Albumin: 3.3 g/dL — ABNORMAL LOW (ref 3.5–5.2)
Alkaline Phosphatase: 66 U/L (ref 39–117)
BUN: 8 mg/dL (ref 6–23)
CO2: 26 mEq/L (ref 19–32)
Calcium: 9.6 mg/dL (ref 8.4–10.5)
Chloride: 102 mEq/L (ref 96–112)
Creatinine, Ser: 0.55 mg/dL (ref 0.50–1.10)
GFR calc Af Amer: >60 mL/min (ref >60)
GFR calc non Af Amer: >60 mL/min (ref >60)
Glucose, Bld: 91 mg/dL (ref 70–99)
Potassium: 3.4 mEq/L — ABNORMAL LOW (ref 3.5–5.1)
Sodium: 136 mEq/L (ref 135–145)
Total Bilirubin: 0.3 mg/dL (ref 0.3–1.2)
Total Protein: 6.9 g/dL (ref 6.0–8.3)

## 2010-10-07 LAB — DIFFERENTIAL
Basophils Absolute: 0 10*3/uL (ref 0.0–0.1)
Basophils Relative: 0 % (ref 0–1)
Eosinophils Absolute: 0.2 10*3/uL (ref 0.0–0.7)
Eosinophils Relative: 3 % (ref 0–5)
Lymphocytes Relative: 42 % (ref 12–46)
Lymphs Abs: 3.3 10*3/uL (ref 0.7–4.0)
Monocytes Absolute: 0.3 10*3/uL (ref 0.1–1.0)
Monocytes Relative: 4 % (ref 3–12)
Neutro Abs: 3.9 10*3/uL (ref 1.7–7.7)
Neutrophils Relative %: 50 % (ref 43–77)

## 2010-10-07 LAB — LIPASE, BLOOD: Lipase: 19 U/L (ref 11–59)

## 2010-10-07 MED ORDER — IOHEXOL 300 MG/ML  SOLN
100.0000 mL | Freq: Once | INTRAMUSCULAR | Status: AC | PRN
  Administered 2010-10-07: 100 mL via INTRAVENOUS

## 2010-10-09 LAB — GLUCOSE, CAPILLARY: Glucose-Capillary: 93 mg/dL (ref 70–99)

## 2010-10-20 ENCOUNTER — Ambulatory Visit (HOSPITAL_COMMUNITY)
Admission: RE | Admit: 2010-10-20 | Discharge: 2010-10-20 | Disposition: A | Discharge status: Home / Self Care | Payer: Medicare Other | Source: Home / Self Care | Attending: Psychiatry | Admitting: Psychiatry

## 2010-10-20 ENCOUNTER — Emergency Department (HOSPITAL_COMMUNITY)
Admission: EM | Admit: 2010-10-20 | Discharge: 2010-10-24 | Disposition: A | Discharge status: Other Acute Inpatient Hospital | Payer: Medicare Other | Attending: Emergency Medicine | Admitting: Emergency Medicine

## 2010-10-20 DIAGNOSIS — F3289 Other specified depressive episodes: Secondary | ICD-10-CM | POA: Insufficient documentation

## 2010-10-20 DIAGNOSIS — F3130 Bipolar disorder, current episode depressed, mild or moderate severity, unspecified: Secondary | ICD-10-CM | POA: Insufficient documentation

## 2010-10-20 DIAGNOSIS — Z79899 Other long term (current) drug therapy: Secondary | ICD-10-CM | POA: Insufficient documentation

## 2010-10-20 DIAGNOSIS — F329 Major depressive disorder, single episode, unspecified: Secondary | ICD-10-CM | POA: Insufficient documentation

## 2010-10-20 DIAGNOSIS — R45851 Suicidal ideations: Secondary | ICD-10-CM | POA: Insufficient documentation

## 2010-10-20 DIAGNOSIS — I10 Essential (primary) hypertension: Secondary | ICD-10-CM | POA: Insufficient documentation

## 2010-10-20 DIAGNOSIS — J45909 Unspecified asthma, uncomplicated: Secondary | ICD-10-CM | POA: Insufficient documentation

## 2010-10-20 DIAGNOSIS — R55 Syncope and collapse: Secondary | ICD-10-CM | POA: Insufficient documentation

## 2010-10-20 LAB — COMPREHENSIVE METABOLIC PANEL
ALT: 19 U/L (ref 0–35)
AST: 18 U/L (ref 0–37)
Albumin: 3.7 g/dL (ref 3.5–5.2)
Alkaline Phosphatase: 77 U/L (ref 39–117)
BUN: 12 mg/dL (ref 6–23)
CO2: 25 mEq/L (ref 19–32)
Calcium: 9.6 mg/dL (ref 8.4–10.5)
Chloride: 103 mEq/L (ref 96–112)
Creatinine, Ser: 0.54 mg/dL (ref 0.50–1.10)
GFR calc Af Amer: >60 mL/min (ref >60)
GFR calc non Af Amer: >60 mL/min (ref >60)
Glucose, Bld: 96 mg/dL (ref 70–99)
Potassium: 4.1 mEq/L (ref 3.5–5.1)
Sodium: 137 mEq/L (ref 135–145)
Total Bilirubin: 0.3 mg/dL (ref 0.3–1.2)
Total Protein: 7.3 g/dL (ref 6.0–8.3)

## 2010-10-20 LAB — URINALYSIS, ROUTINE W REFLEX MICROSCOPIC
Bilirubin Urine: NEGATIVE
Glucose, UA: NEGATIVE mg/dL
Hgb urine dipstick: NEGATIVE
Ketones, ur: NEGATIVE mg/dL
Leukocytes, UA: NEGATIVE
Nitrite: NEGATIVE
Protein, ur: NEGATIVE mg/dL
Specific Gravity, Urine: 1.014 (ref 1.005–1.030)
Urobilinogen, UA: 0.2 mg/dL (ref 0.0–1.0)
pH: 7.5 (ref 5.0–8.0)

## 2010-10-20 LAB — DIFFERENTIAL
Basophils Absolute: 0 10*3/uL (ref 0.0–0.1)
Basophils Relative: 0 % (ref 0–1)
Eosinophils Absolute: 0.1 10*3/uL (ref 0.0–0.7)
Eosinophils Relative: 2 % (ref 0–5)
Lymphocytes Relative: 27 % (ref 12–46)
Lymphs Abs: 2 10*3/uL (ref 0.7–4.0)
Monocytes Absolute: 0.3 10*3/uL (ref 0.1–1.0)
Monocytes Relative: 4 % (ref 3–12)
Neutro Abs: 5.1 10*3/uL (ref 1.7–7.7)
Neutrophils Relative %: 68 % (ref 43–77)

## 2010-10-20 LAB — RAPID URINE DRUG SCREEN, HOSP PERFORMED
Amphetamines: NOT DETECTED
Barbiturates: NOT DETECTED
Benzodiazepines: NOT DETECTED
Cocaine: NOT DETECTED
Opiates: NOT DETECTED
Tetrahydrocannabinol: NOT DETECTED

## 2010-10-20 LAB — CBC
HCT: 35.7 % — ABNORMAL LOW (ref 36.0–46.0)
Hemoglobin: 12 g/dL (ref 12.0–15.0)
MCH: 30.9 pg (ref 26.0–34.0)
MCHC: 33.6 g/dL (ref 30.0–36.0)
MCV: 92 fL (ref 78.0–100.0)
Platelets: 317 10*3/uL (ref 150–400)
RBC: 3.88 MIL/uL (ref 3.87–5.11)
RDW: 13.8 % (ref 11.5–15.5)
WBC: 7.5 10*3/uL (ref 4.0–10.5)

## 2010-10-20 LAB — SALICYLATE LEVEL: Salicylate Lvl: <2 mg/dL — ABNORMAL LOW (ref 2.8–20.0)

## 2010-10-20 LAB — ETHANOL: Alcohol, Ethyl (B): <11 mg/dL (ref 0–11)

## 2010-10-20 LAB — LITHIUM LEVEL: Lithium Lvl: 0.57 mEq/L — ABNORMAL LOW (ref 0.80–1.40)

## 2010-10-20 LAB — ACETAMINOPHEN LEVEL: Acetaminophen (Tylenol), Serum: <15 ug/mL (ref 10–30)

## 2010-10-22 LAB — BASIC METABOLIC PANEL
BUN: 11 mg/dL (ref 6–23)
CO2: 26 mEq/L (ref 19–32)
Calcium: 9.1 mg/dL (ref 8.4–10.5)
Chloride: 102 mEq/L (ref 96–112)
Creatinine, Ser: 0.64 mg/dL (ref 0.50–1.10)
GFR calc Af Amer: >60 mL/min (ref >60)
GFR calc non Af Amer: >60 mL/min (ref >60)
Glucose, Bld: 84 mg/dL (ref 70–99)
Potassium: 3.9 mEq/L (ref 3.5–5.1)
Sodium: 137 mEq/L (ref 135–145)

## 2010-10-22 LAB — GLUCOSE, CAPILLARY: Glucose-Capillary: 91 mg/dL (ref 70–99)

## 2010-11-01 IMAGING — CT CT CERVICAL SPINE WITHOUT CONTRAST
1 series · 12 of 14 positions shown, 15 images · non-contrast
Comparison: none

REASON FOR EXAM: FALL, NECK PAIN
COMMENTS:

[Series 5: axial · axial · 0.33mm/px · z∈[+219,+345]mm · 12 of 76 slices shown, 15 images]
[im 6/76  soft-tissue]
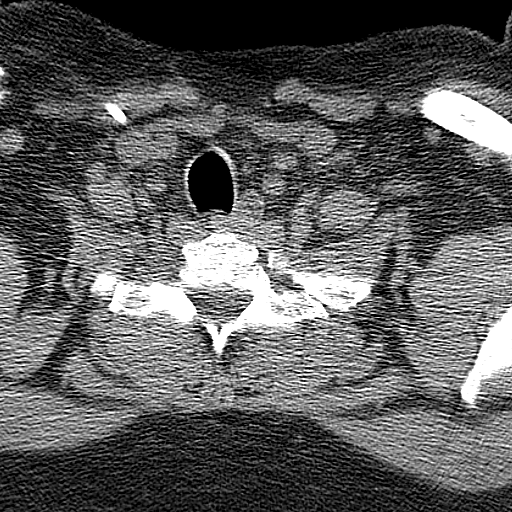
[im 6/76  bone]
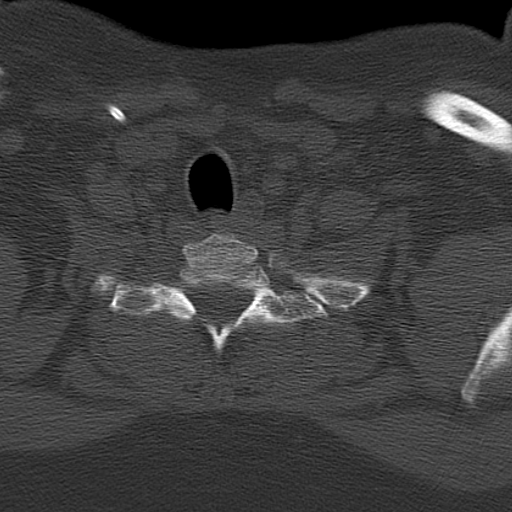
[im 12/76  bone]
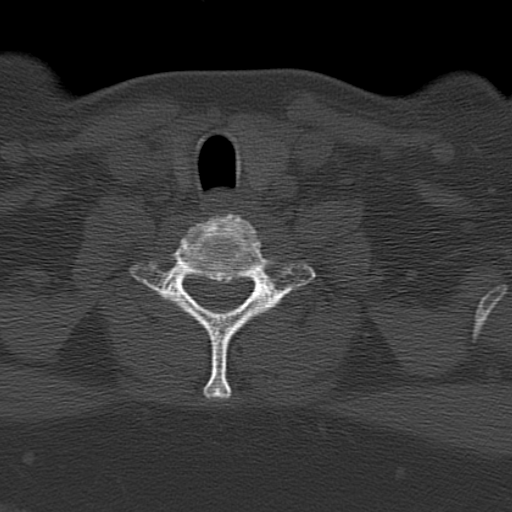
[im 18/76  bone]
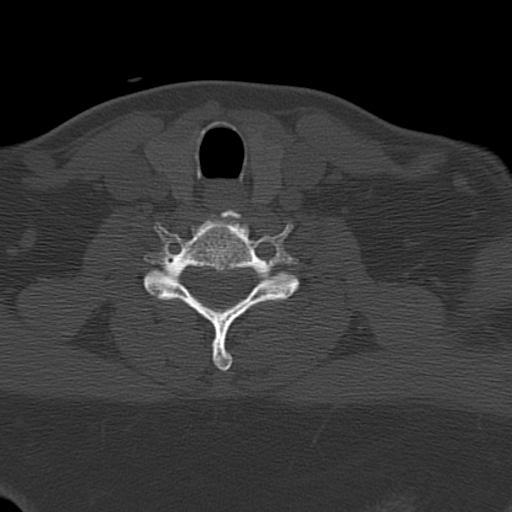
[im 24/76  bone]
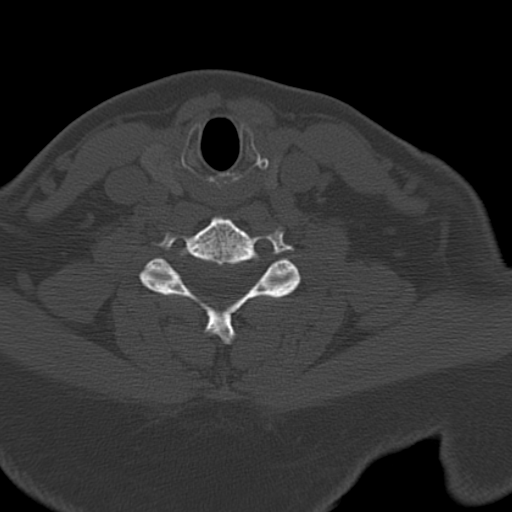
[im 29/76  soft-tissue]
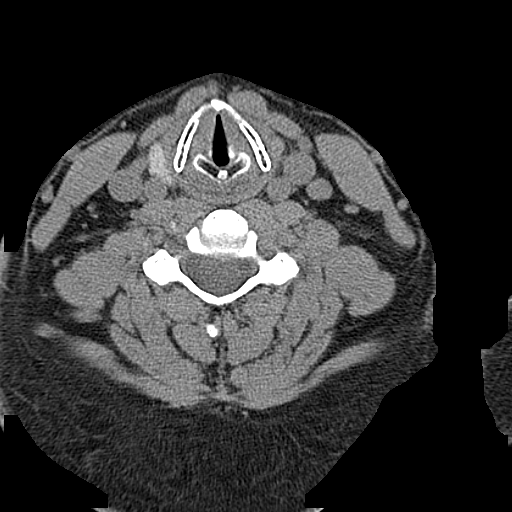
[im 29/76  bone]
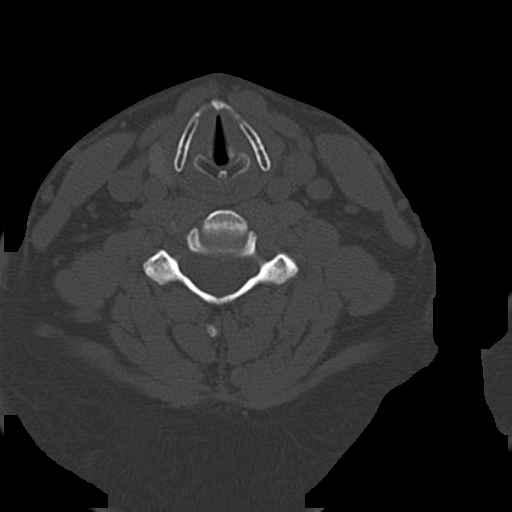
[im 35/76  bone]
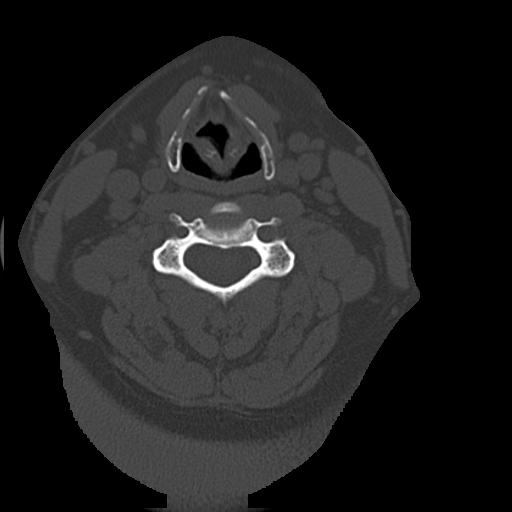
[im 41/76  bone]
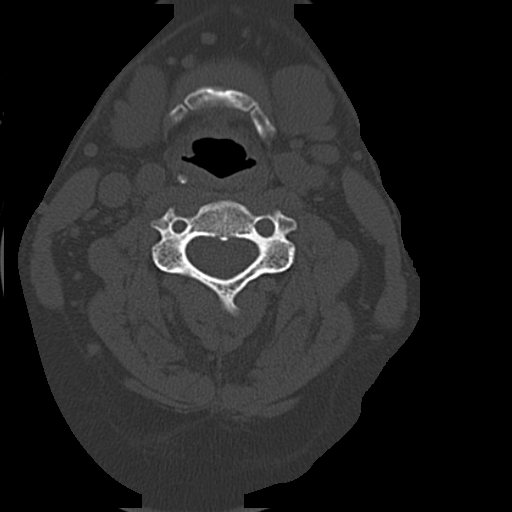
[im 47/76  bone]
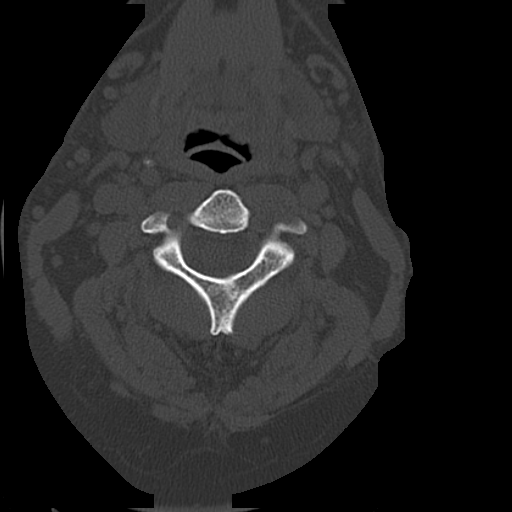
[im 52/76  soft-tissue]
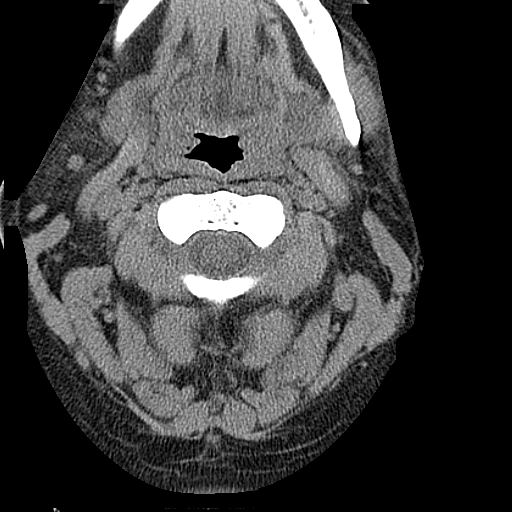
[im 52/76  bone]
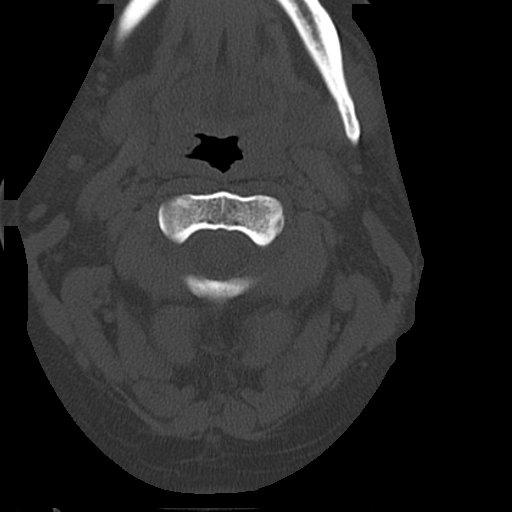
[im 58/76  bone]
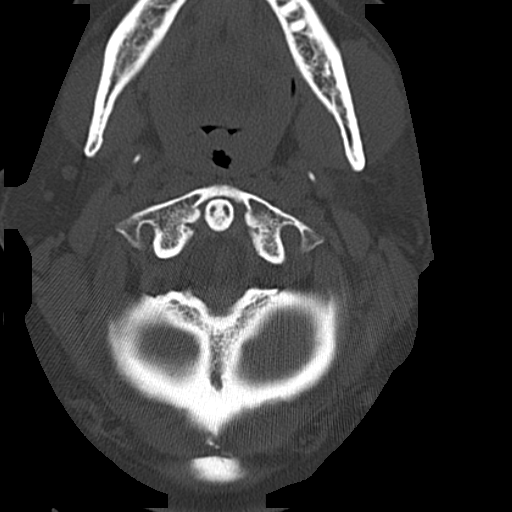
[im 64/76  bone]
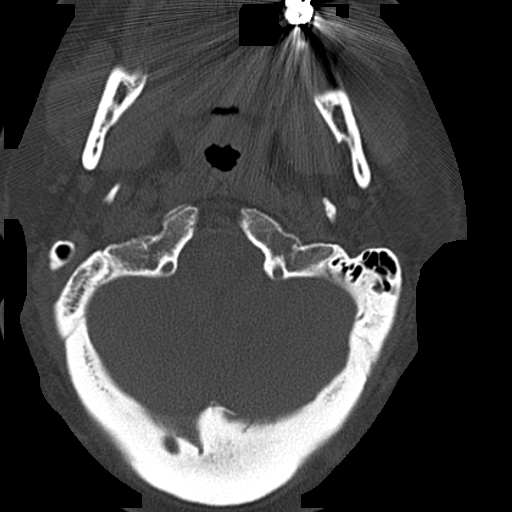
[im 70/76  bone]
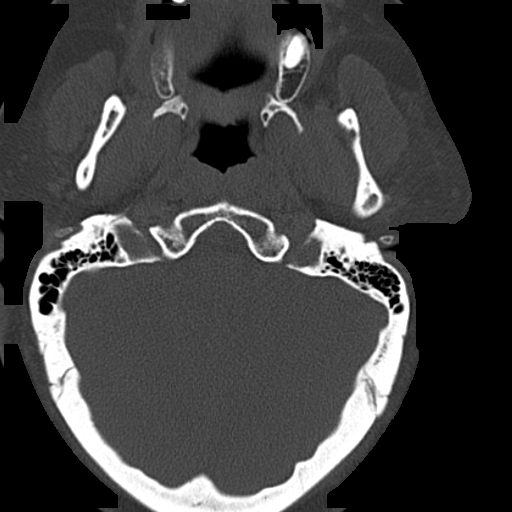

[12 of 14 positions shown; findings below may reference images not displayed]

PROCEDURE:     CT  - CT CERVICAL SPINE WO  - August 03, 2009 [DATE]

RESULT:     Noncontrast multislice helical acquisition through the cervical
spine demonstrates disc space narrowing and hypertrophic endplate spurring
at the C5-C6 and C6-C7 levels. The prevertebral soft tissues appear grossly
normal without evidence of a foreign body or fluid collection. No bony
encroachment on the spinal canal is evident. The craniocervical and
atlantoaxial junctions appear normal. The odontoid is intact.
IMPRESSION: 1. Degenerative changes.
2. No acute cervical spine bony abnormality.

## 2010-11-01 IMAGING — CR DG CHEST 1V
1 series · 2 of 2 positions shown · non-contrast
Comparison: none

REASON FOR EXAM: FALL, SYNCOPE
COMMENTS:

PROCEDURE:     DXR - DXR CHEST 1 VIEWAP OR PA  - August 03, 2009 [DATE]
RESULT:     A Port-A-Cath is present with the tip projected over the right
atrium. No pneumothorax or pleural effusion is seen. The lung fields are
clear. Heart size is normal. No acute bony abnormalities are seen.

[Series 1: view not recorded · 0.17mm/px · 2 of 2 slices shown]
[im 1/2]
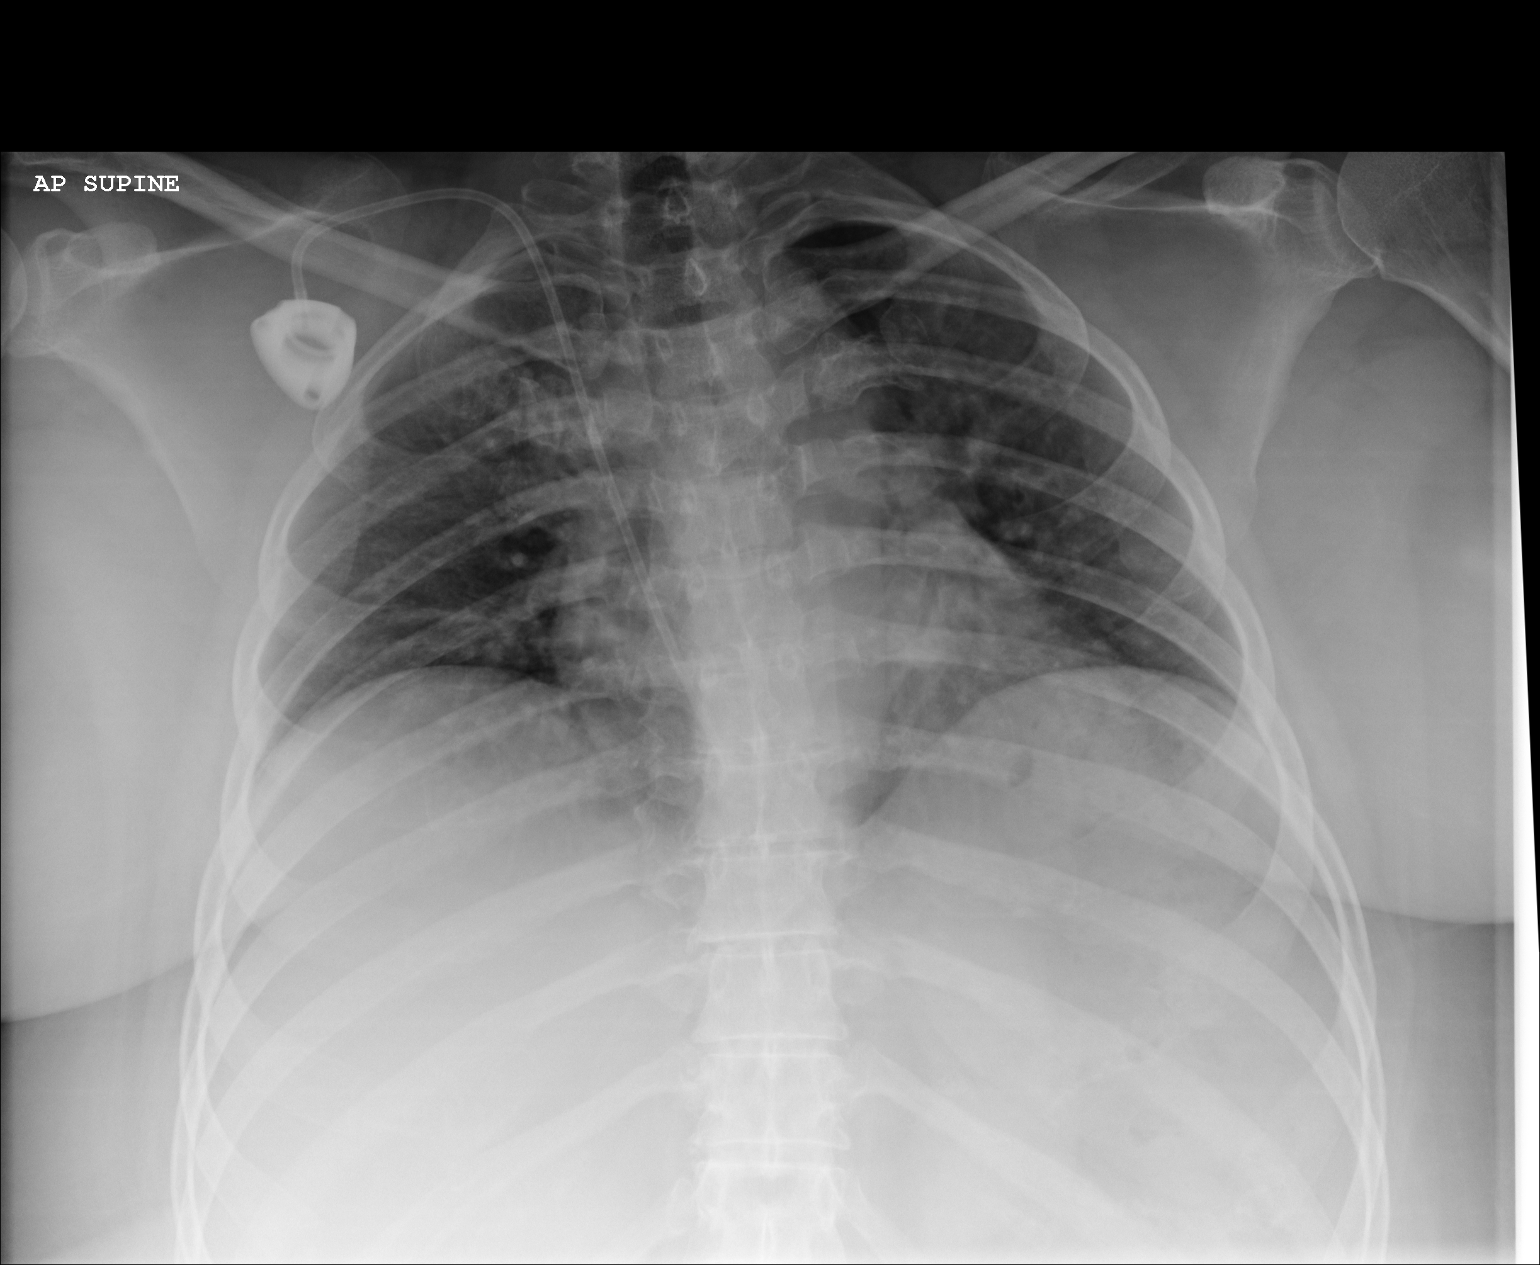
[im 2/2]
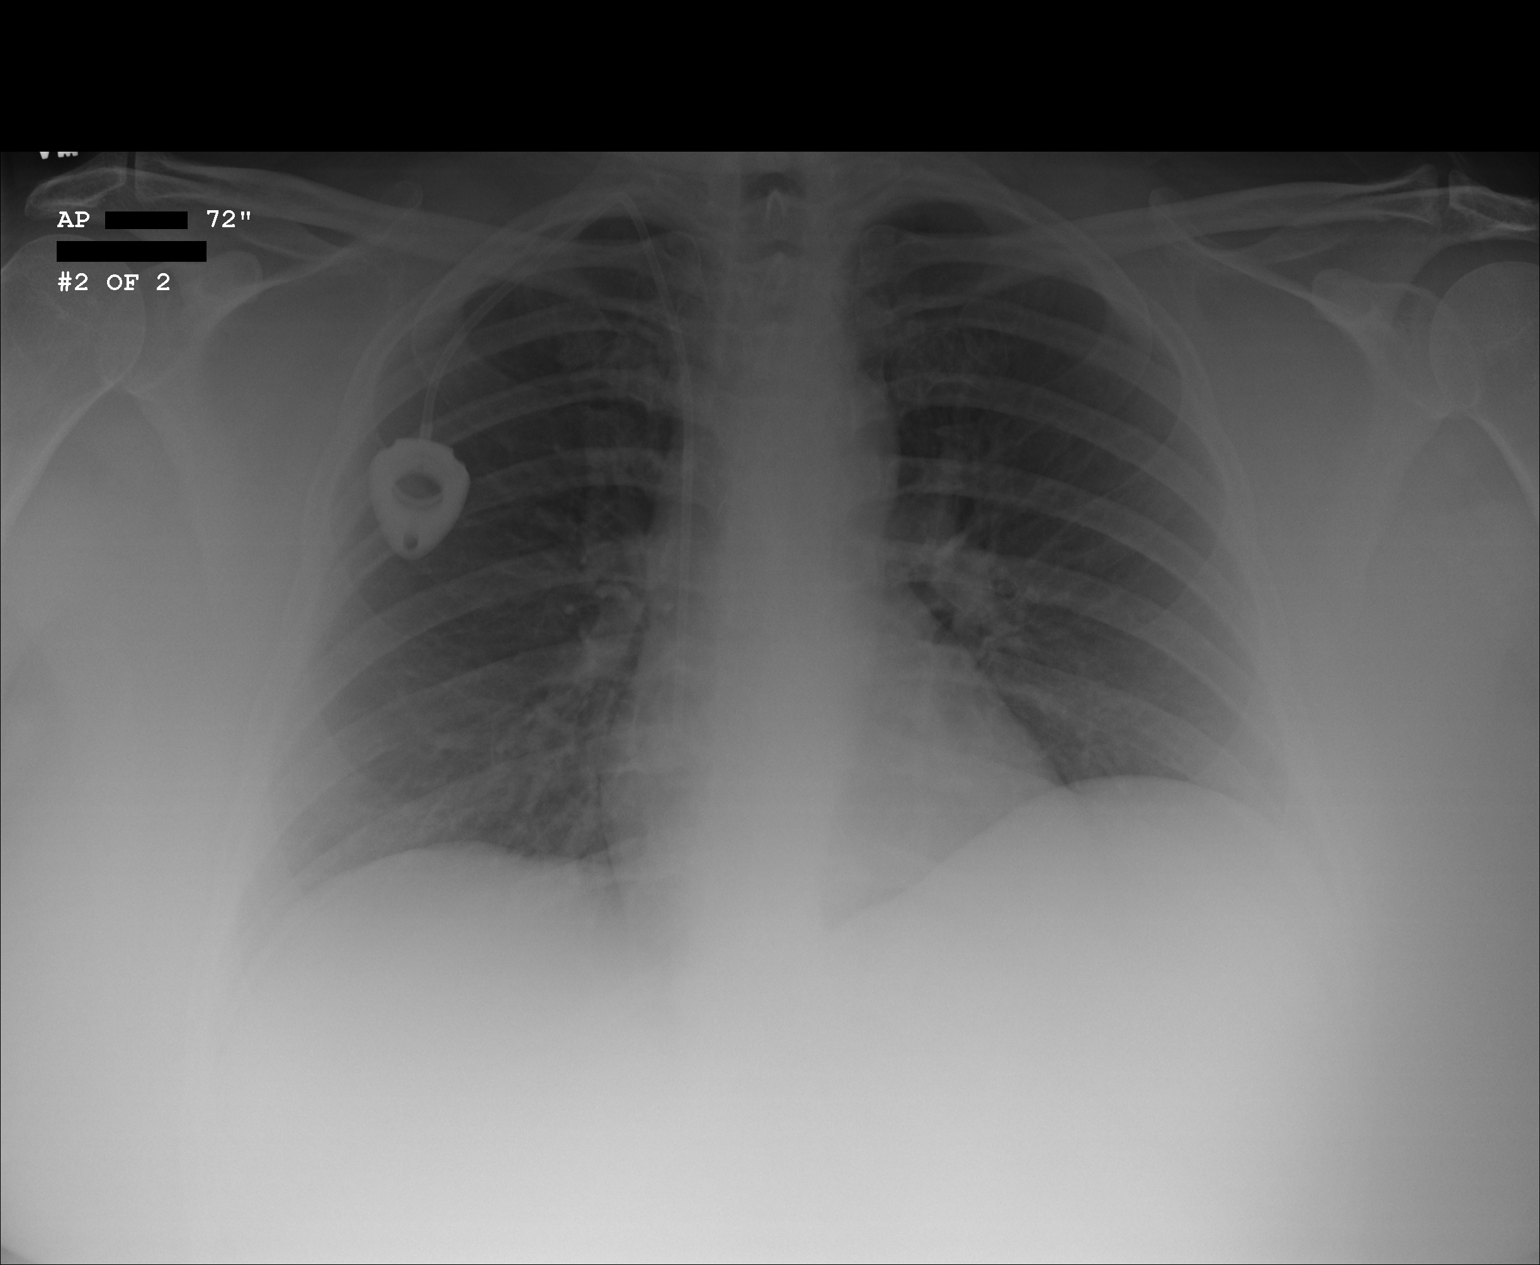

[2 of 2 positions shown; findings below may reference images not displayed]

IMPRESSION: No acute changes are identified.

## 2010-11-01 IMAGING — CT CT HEAD WITHOUT CONTRAST
2 series · 16 of 30 positions shown, 20 images · non-contrast
Comparison: none

REASON FOR EXAM: SYNCOPE, DIZZINESS, HEADACHE
COMMENTS:

PROCEDURE:     CT  - CT HEAD WITHOUT CONTRAST  - August 03, 2009 [DATE]
RESULT:     Technique: Helical 5mm sections were obtained from the skull
base to the vertex without administration of intravenous contrast.

[Series 2: without · axial · non-contrast · 0.45mm/px · z∈[+336,+472]mm · 13 of 33 slices shown, 17 images]
[im 3/33  brain]
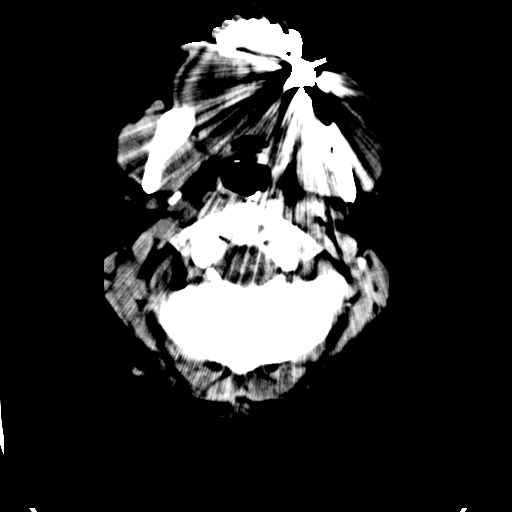
[im 3/33  bone]
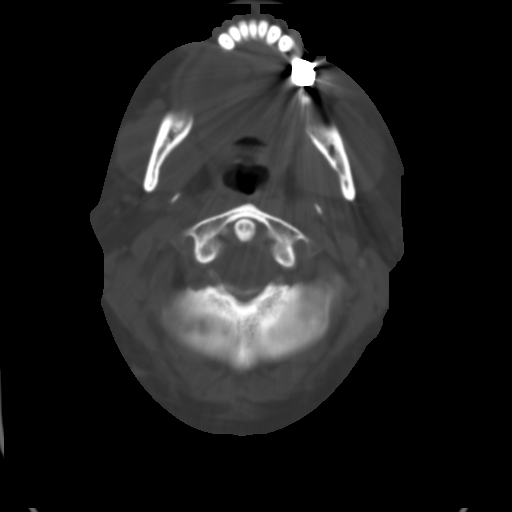
[im 5/33  brain]
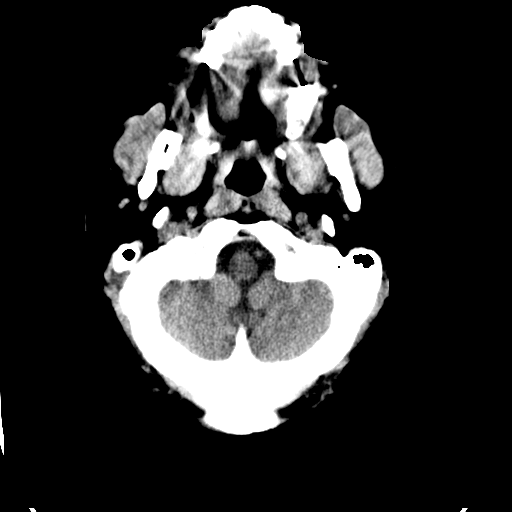
[im 7/33  brain]
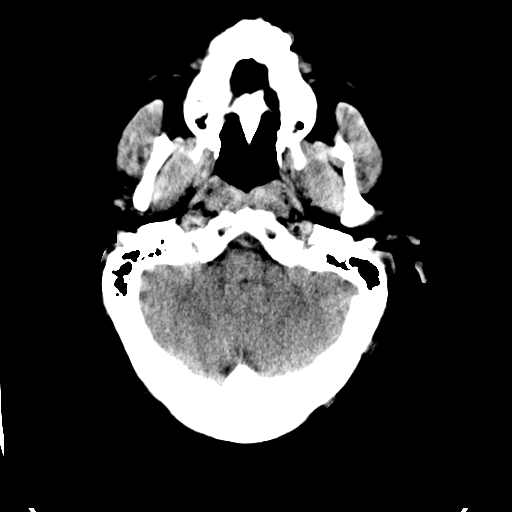
[im 10/33  brain]
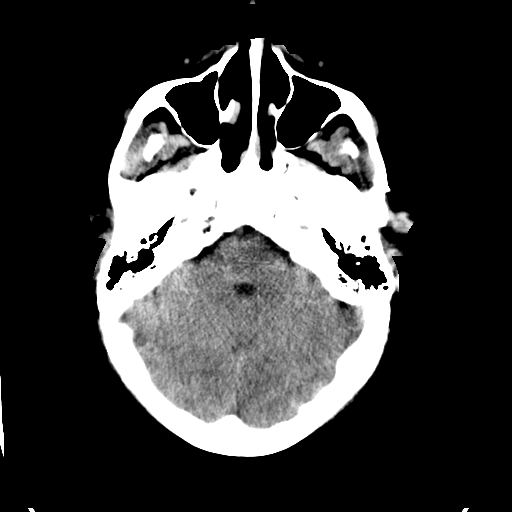
[im 12/33  brain]
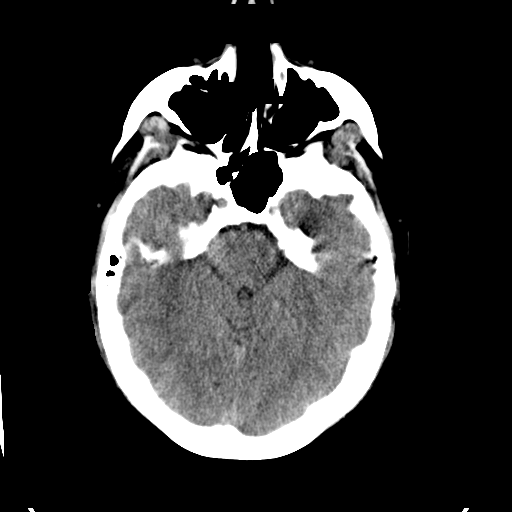
[im 12/33  bone]
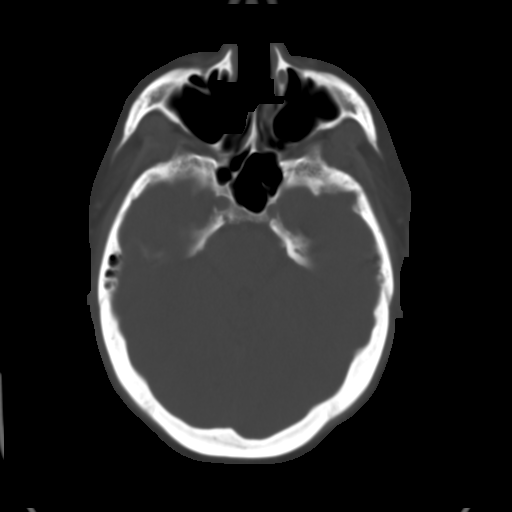
[im 14/33  brain]
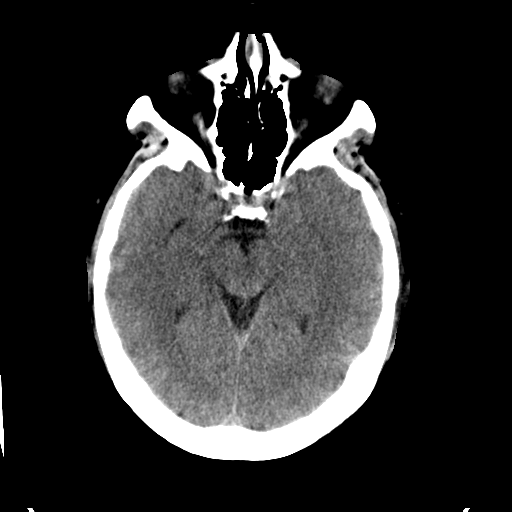
[im 17/33  brain]
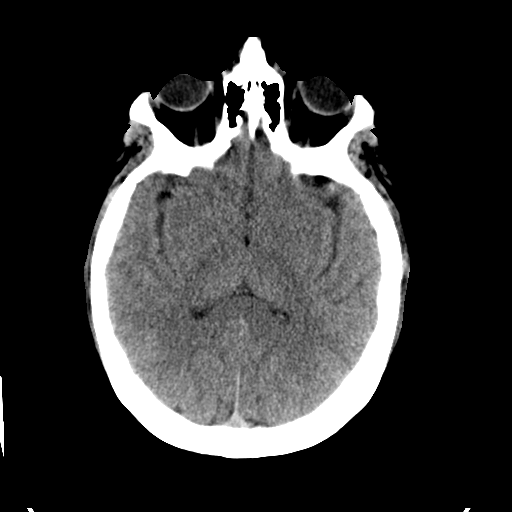
[im 19/33  brain]
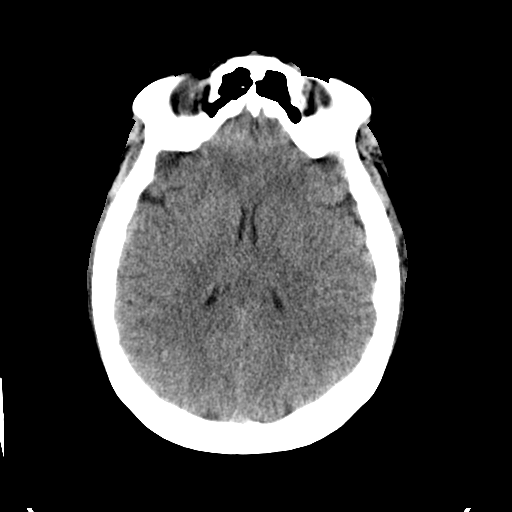
[im 21/33  brain]
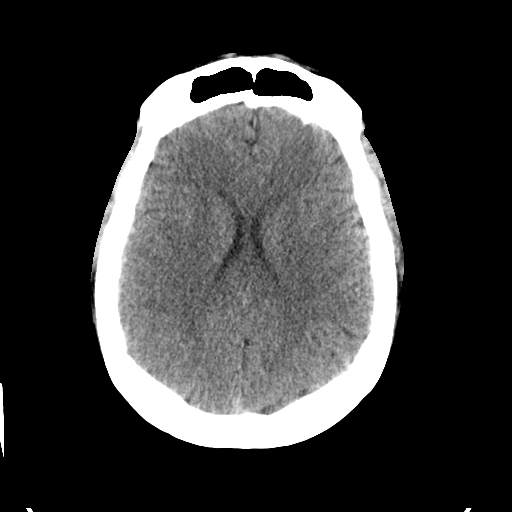
[im 21/33  bone]
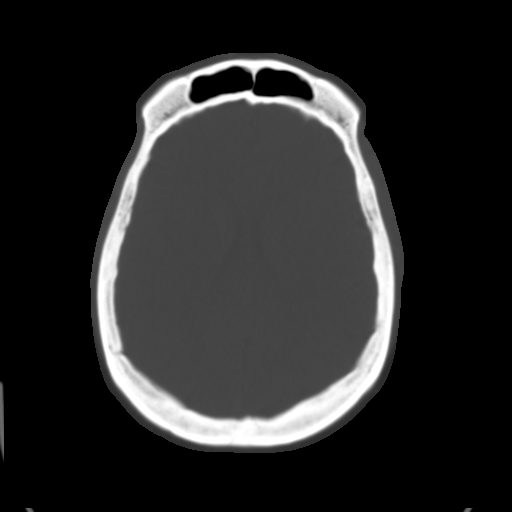
[im 23/33  brain]
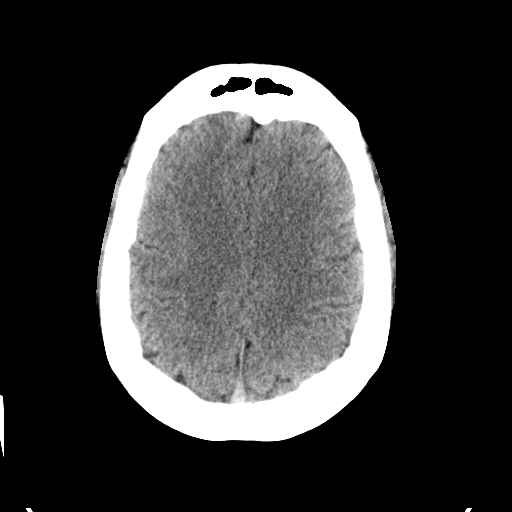
[im 26/33  brain]
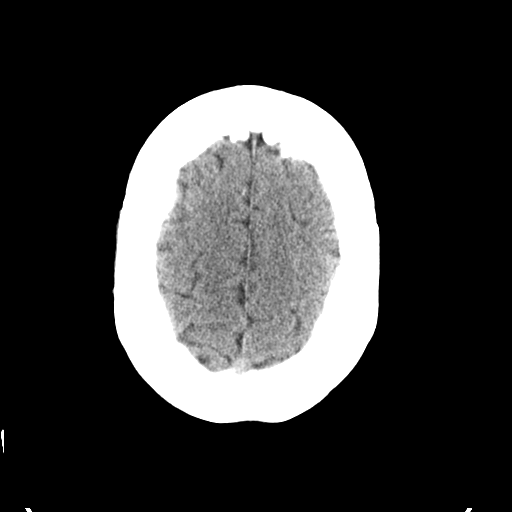
[im 28/33  brain]
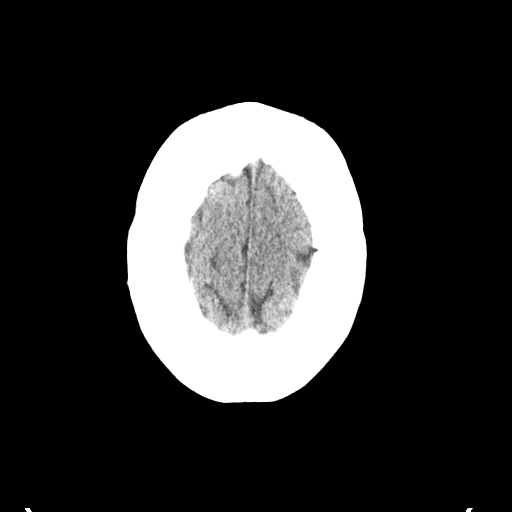
[im 30/33  brain]
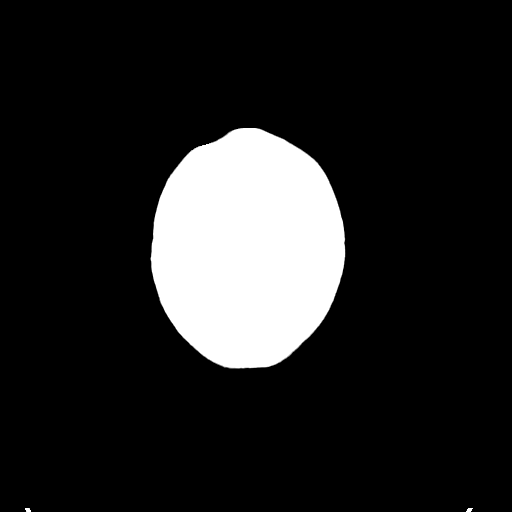
[im 30/33  bone]
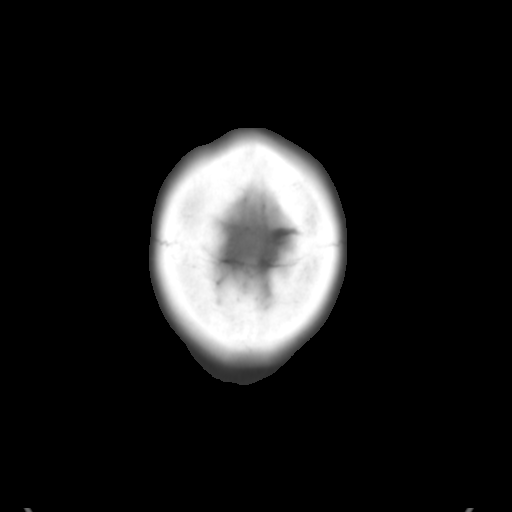

[Series 3: bone · axial · 0.45mm/px · z∈[+336,+382]mm · 3 of 33 slices shown]
[im 3/33  bone]
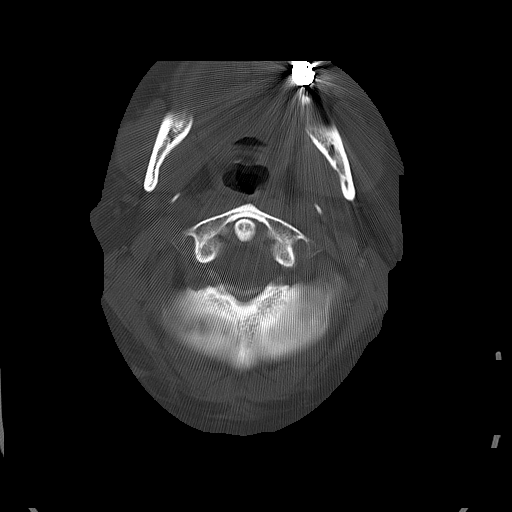
[im 7/33  bone]
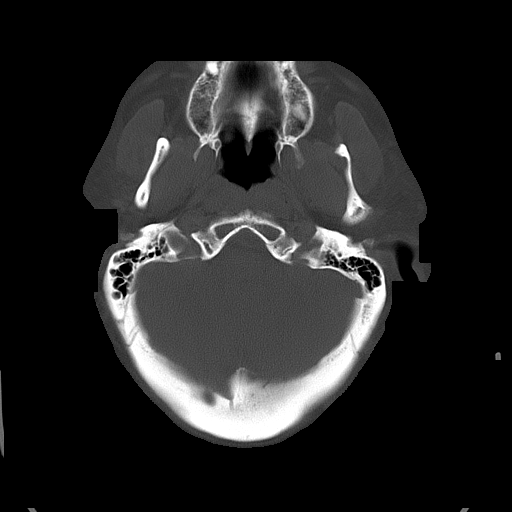
[im 12/33  bone]
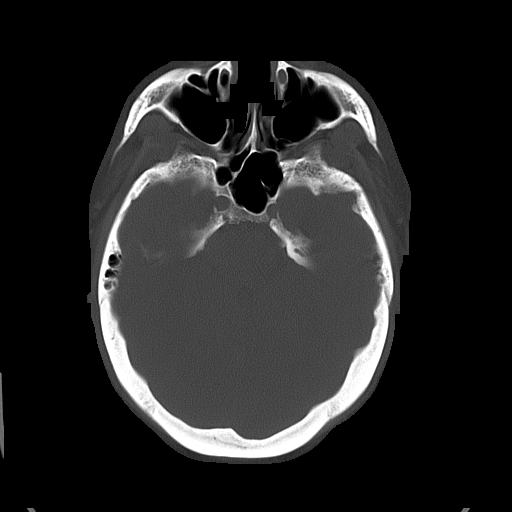

[16 of 30 positions shown; findings below may reference images not displayed]

FINDINGS: There is not evidence of intra-axial fluid collections. There is
no evidence of acute hemorrhage or secondary signs reflecting mass effect or
subacute or chronic focal territorial infarction. The osseous structures
demonstrate no evidence of a depressed skull fracture. If there is
persistent concern clinical follow-up with MRI is recommended.
IMPRESSION: 1. No evidence of acute intracranial abnormalitites.
2. Dr. Sande of the emergency department was informed these findings via a
preliminary faxed report dated 08/03/2009 at 8204 p.m. Eastern time.
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
## 2010-11-01 IMAGING — CR DG SHOULDER 3+V*R*
1 series · 4 of 4 positions shown · non-contrast
Comparison: none

REASON FOR EXAM: FALL, RIGHT SHOULDER PAIN
COMMENTS:

PROCEDURE:     DXR - DXR SHOULDER RIGHT COMPLETE  - August 03, 2009 [DATE]
RESULT:     No fracture, dislocation or other acute bony abnormality is
identified. No lytic or blastic lesions are seen. A Port-A-Cath is present.

[Series 1: view not recorded · 0.17mm/px · 4 of 4 slices shown]
[im 1/4]
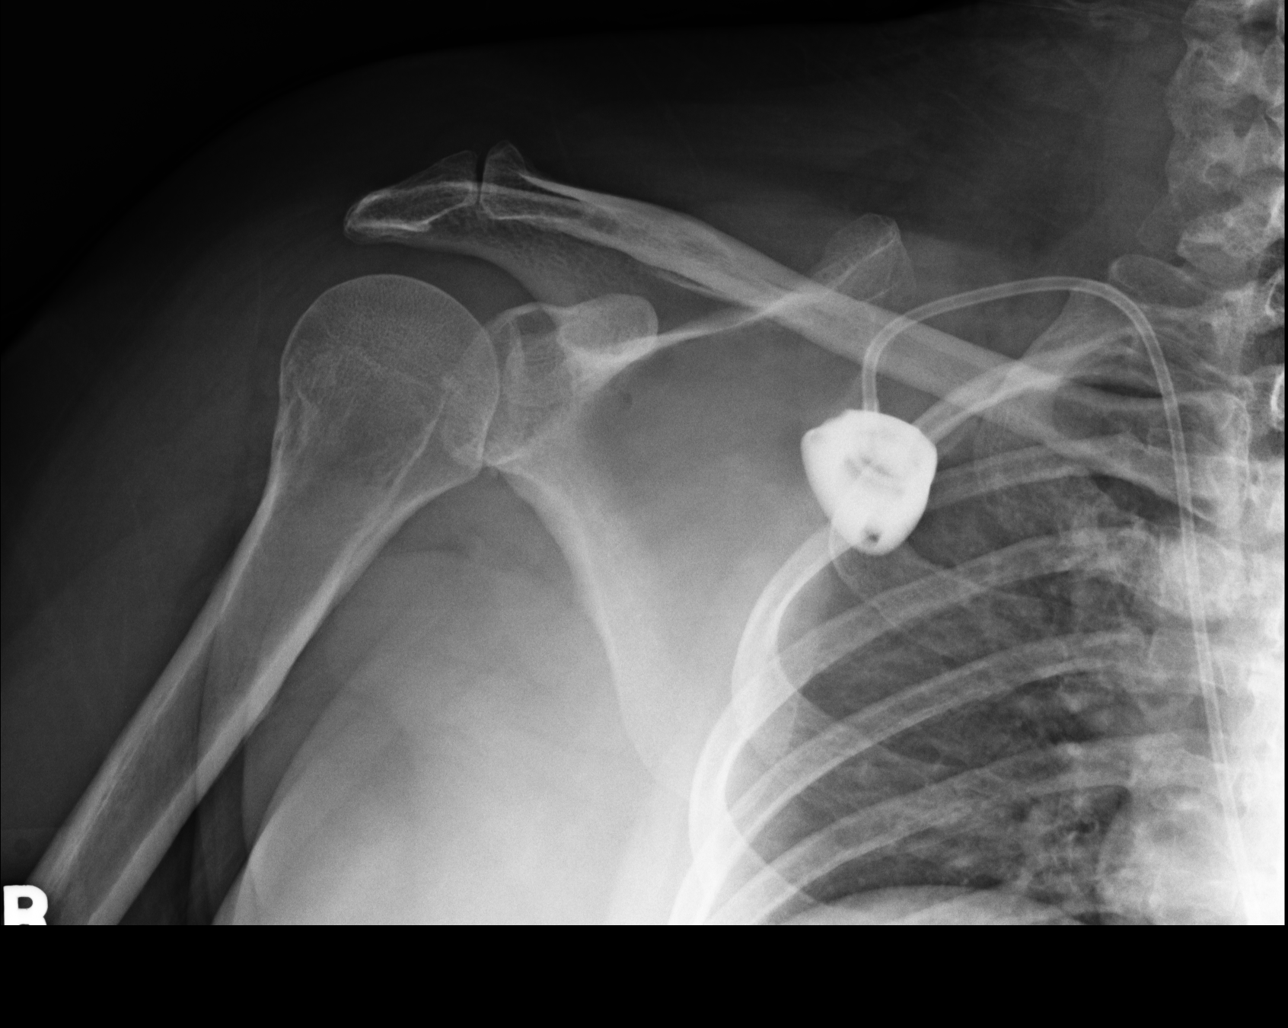
[im 2/4]
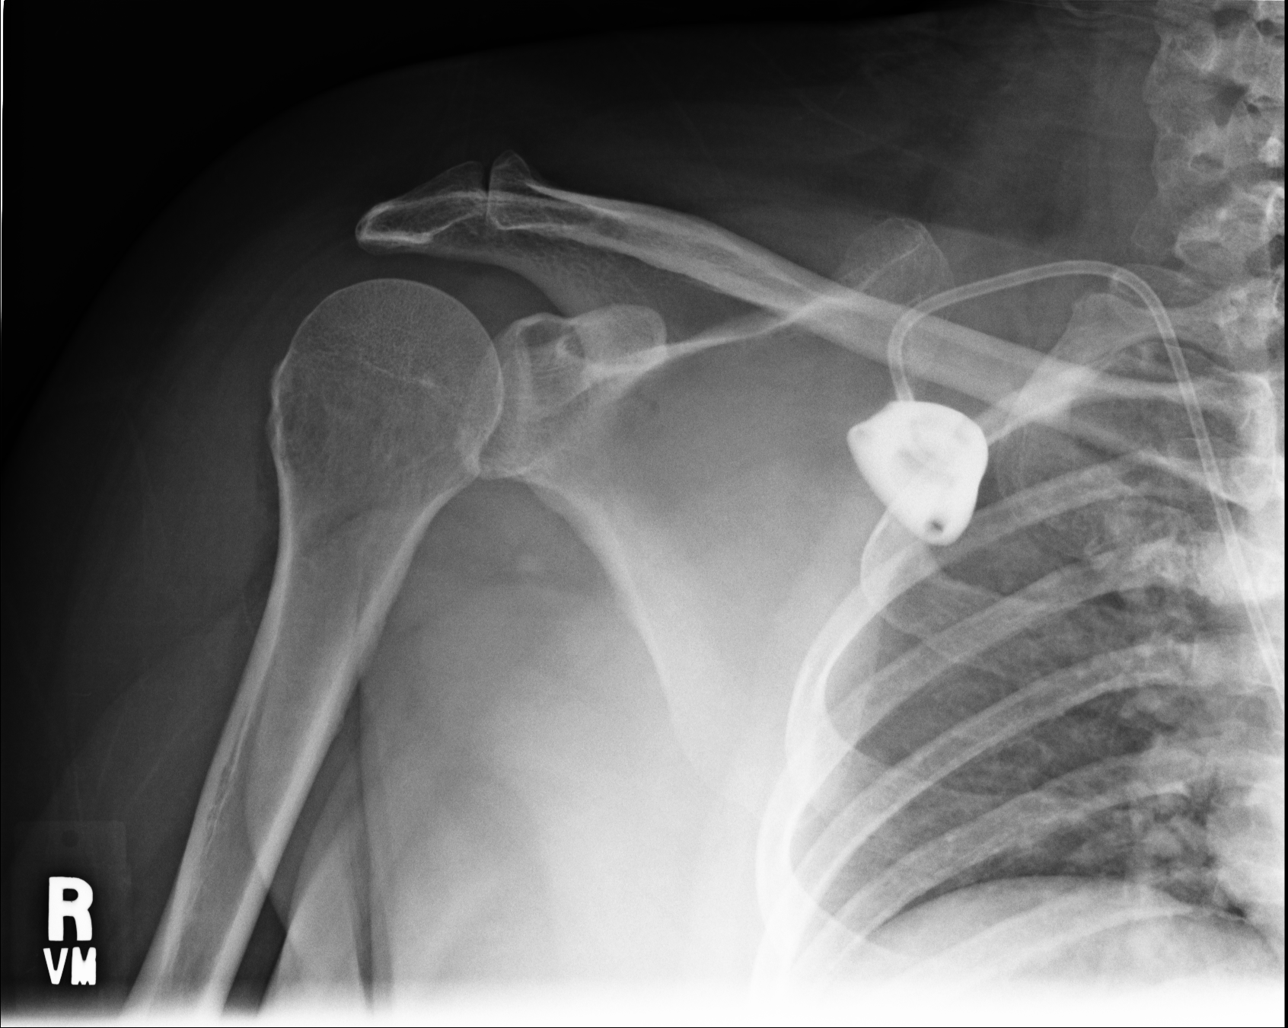
[im 3/4]
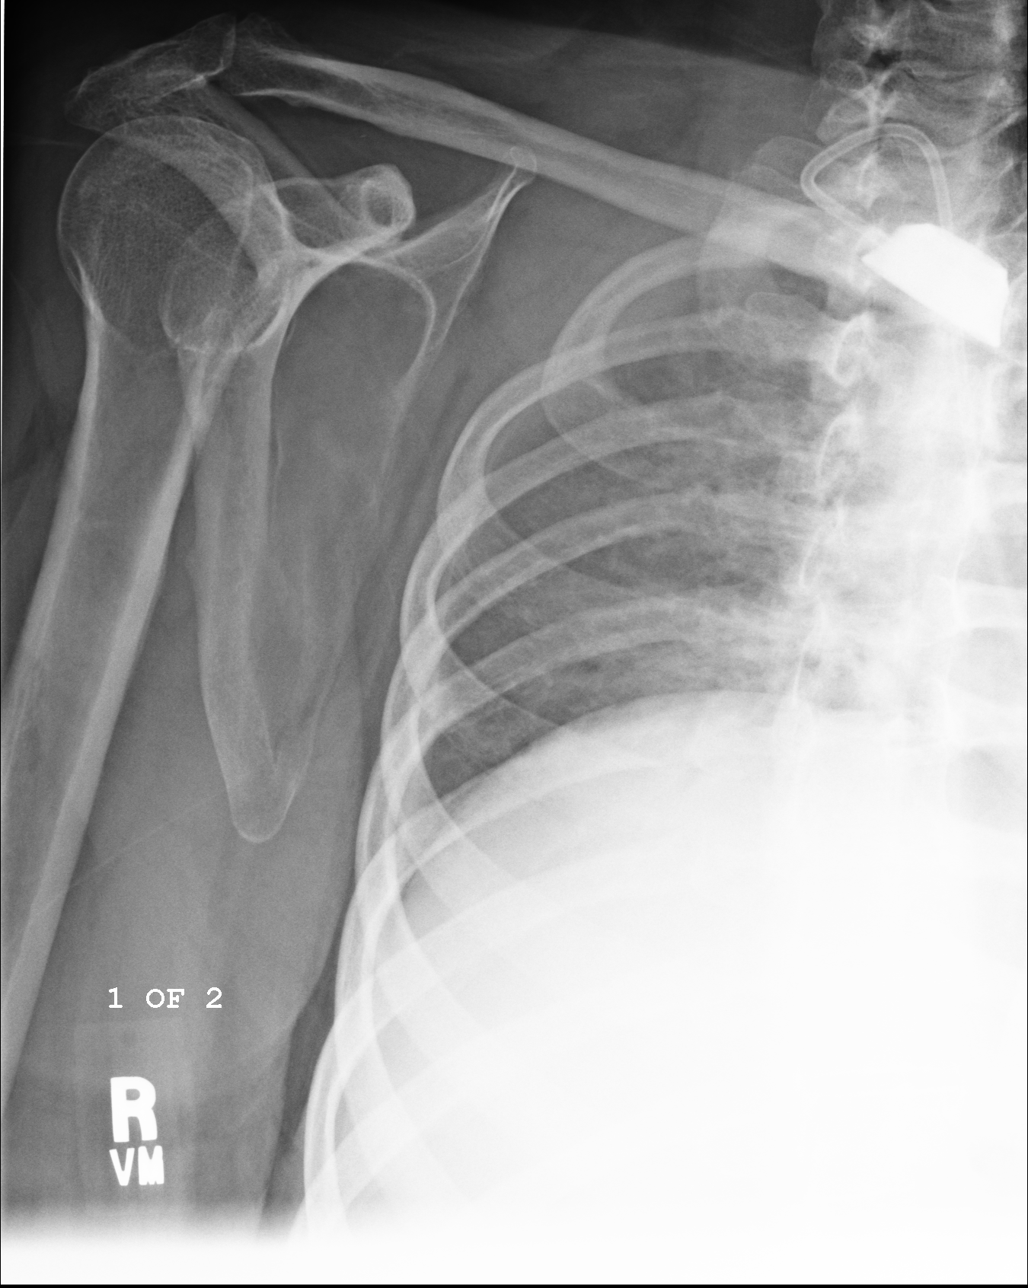
[im 4/4]
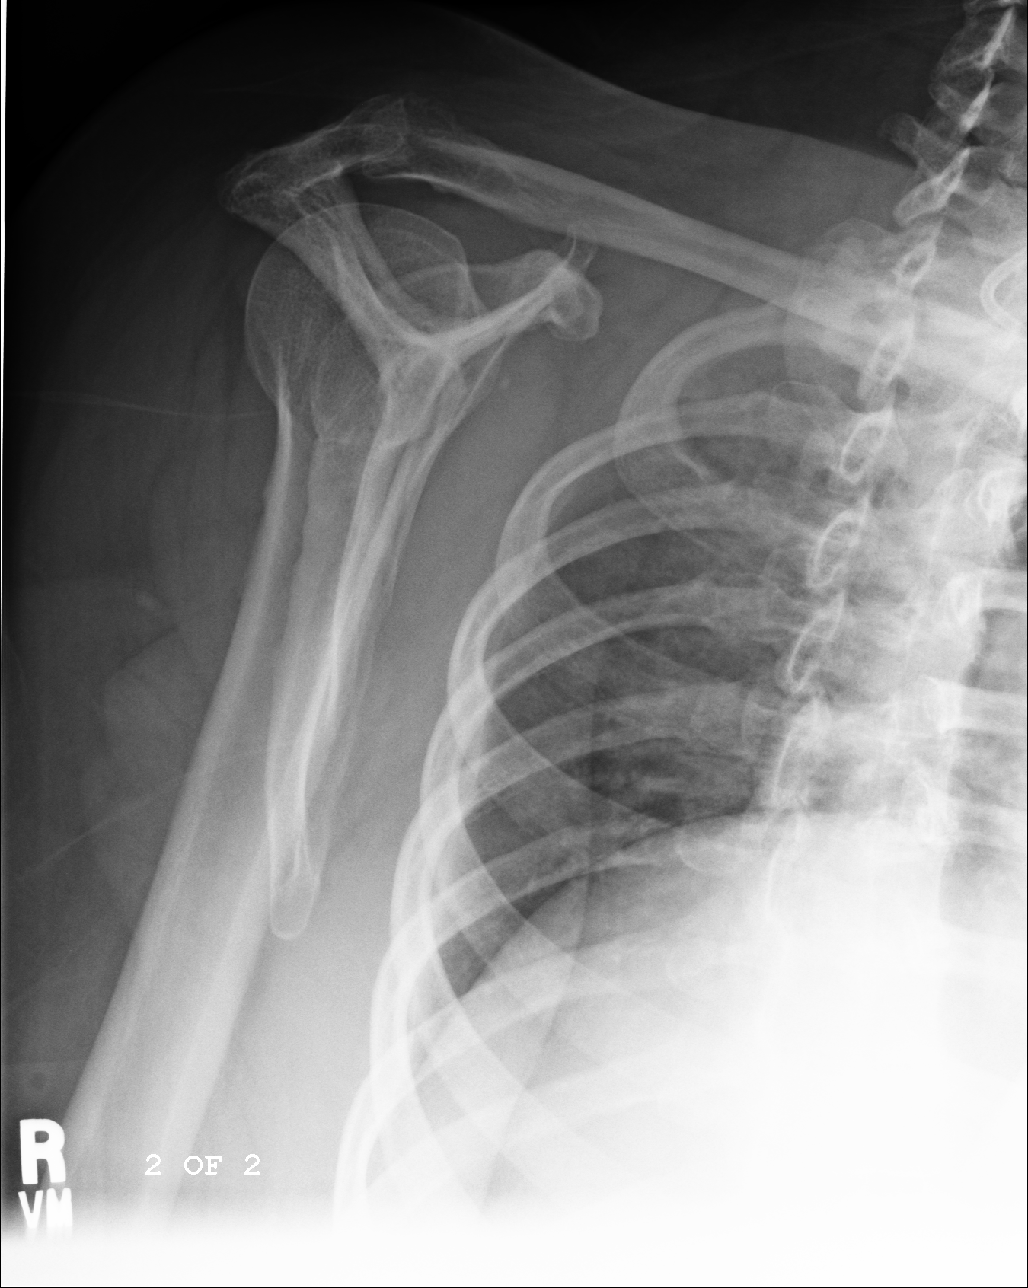

[4 of 4 positions shown; findings below may reference images not displayed]

IMPRESSION: No acute bony abnormalities are identified.

## 2010-11-14 IMAGING — US US TRANSVAGINAL NON-OB
1 series · 14 of 25 positions shown · non-contrast
Comparison: 

CLINICAL DATA: Left lower quadrant pain.  Rule out torsion.

TRANSABDOMINAL AND TRANSVAGINAL ULTRASOUND OF PELVIS
DOPPLER ULTRASOUND OF OVARIES
TECHNIQUE: Both transabdominal and transvaginal ultrasound
examinations of the pelvis were performed including evaluation of
the uterus, ovaries, adnexal regions, and pelvic cul-de-sac. Color
and duplex Doppler ultrasound was utilized to evaluate blood flow
to the ovaries.

[Series 1: us transvaginal non-ob · 0.26mm/px · 14 of 38 slices shown]
[im 1/38]
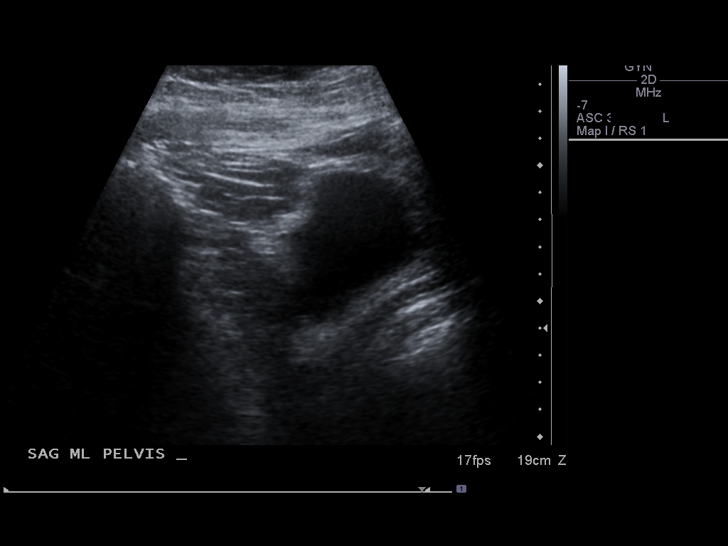
[im 4/38]
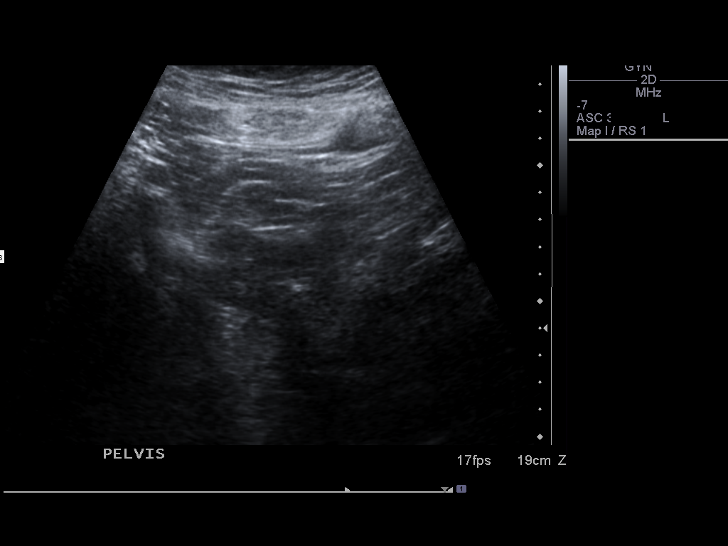
[im 7/38]
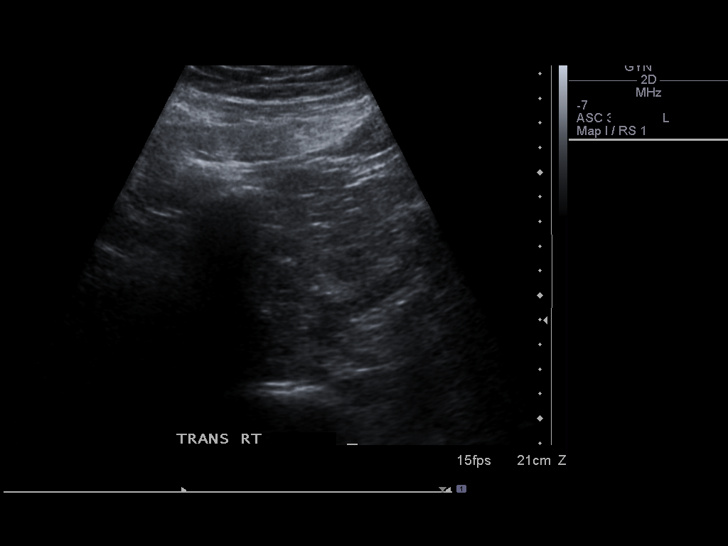
[im 10/38]
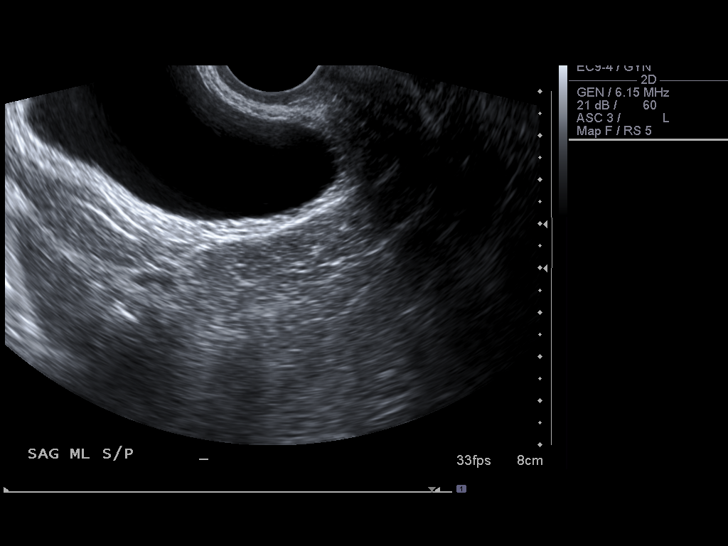
[im 13/38]
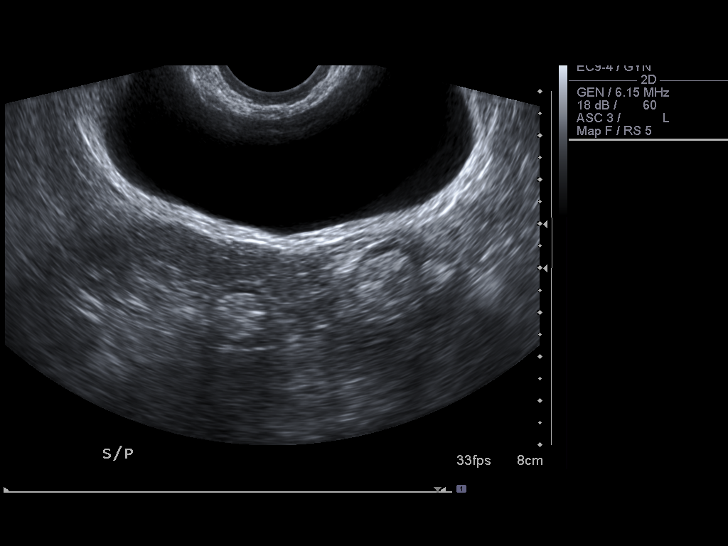
[im 14/38]
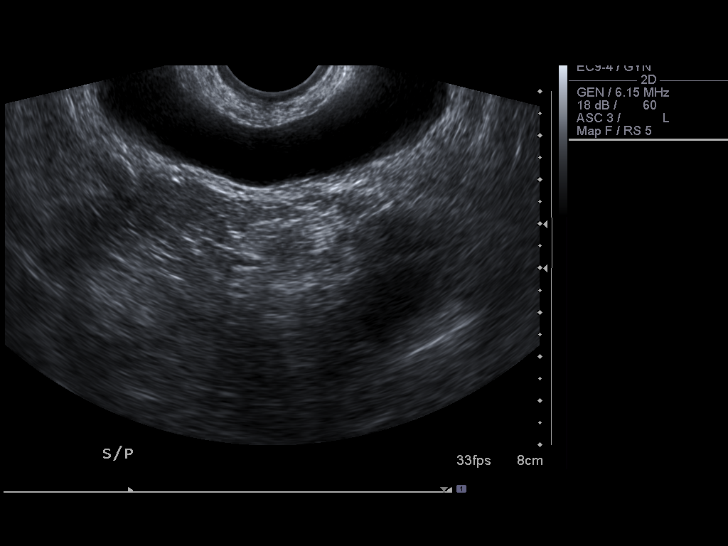
[im 17/38]
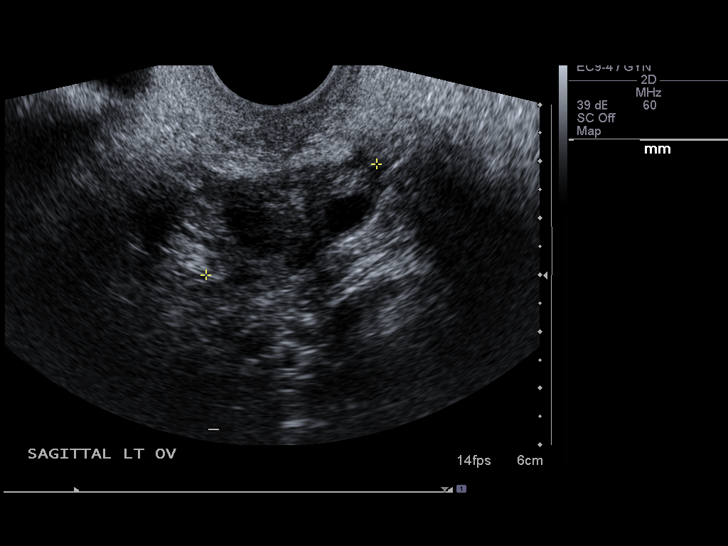
[im 21/38]
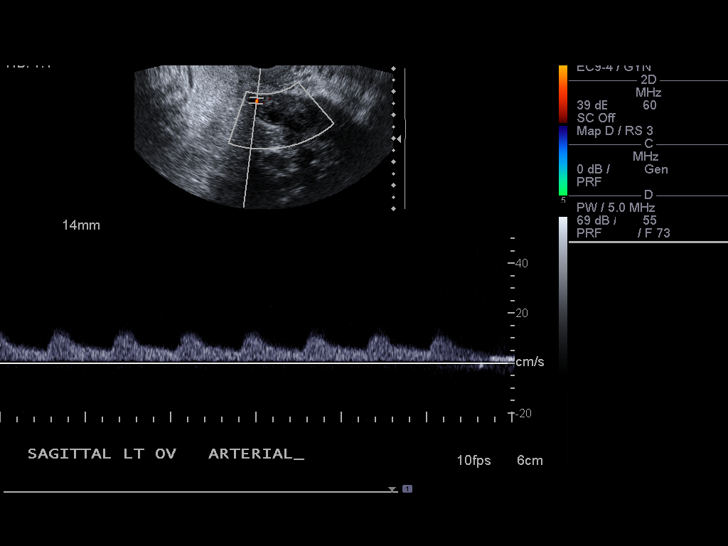
[im 24/38]
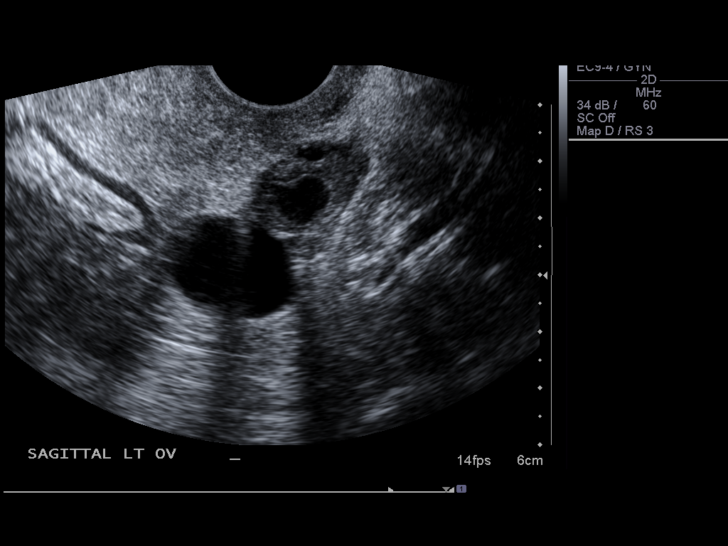
[im 25/38]
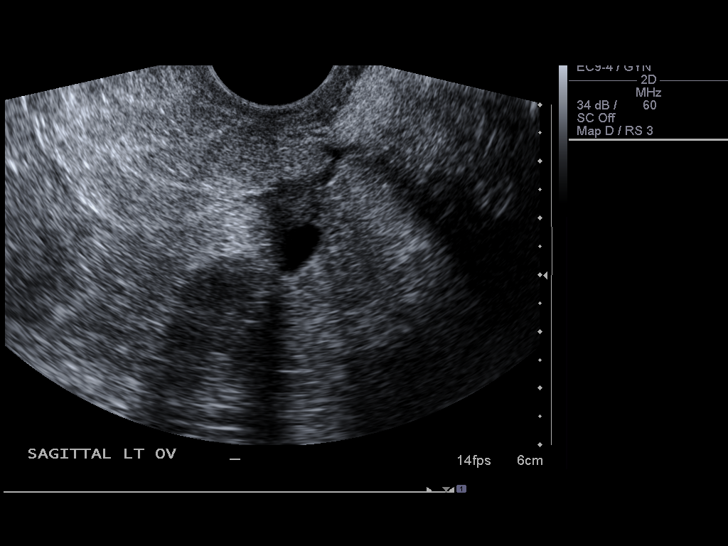
[im 28/38]
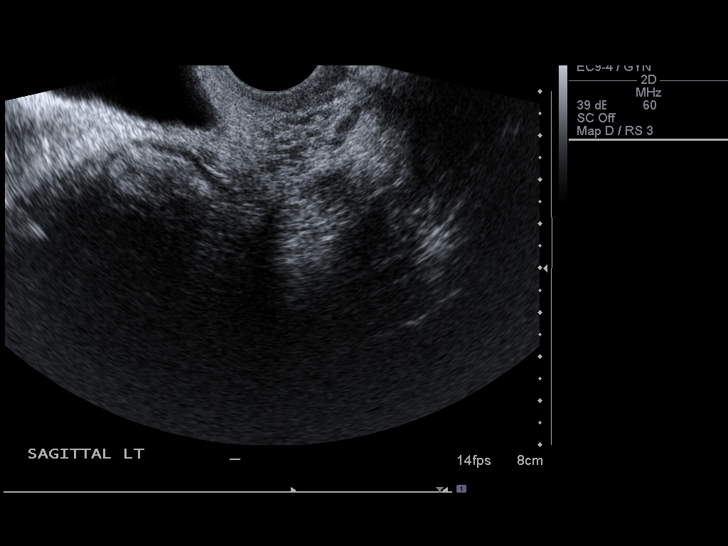
[im 31/38]
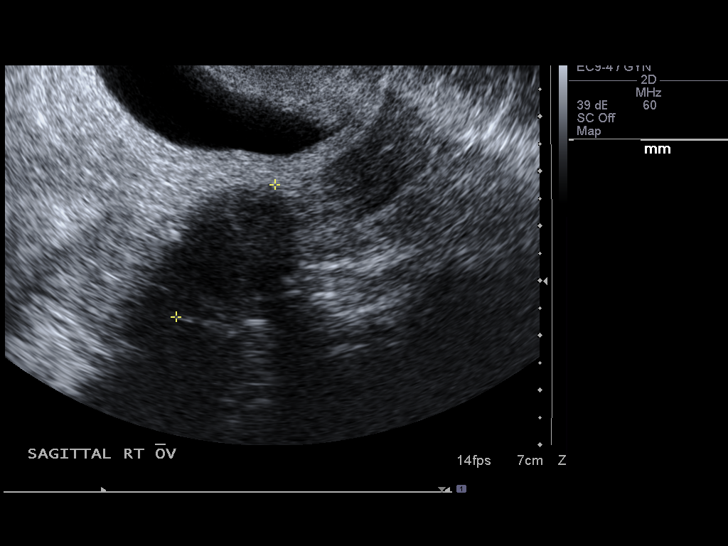
[im 34/38]
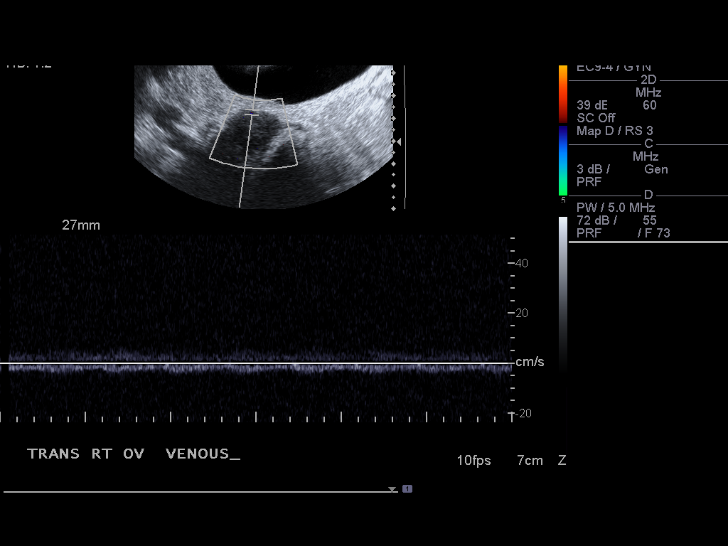
[im 38/38]
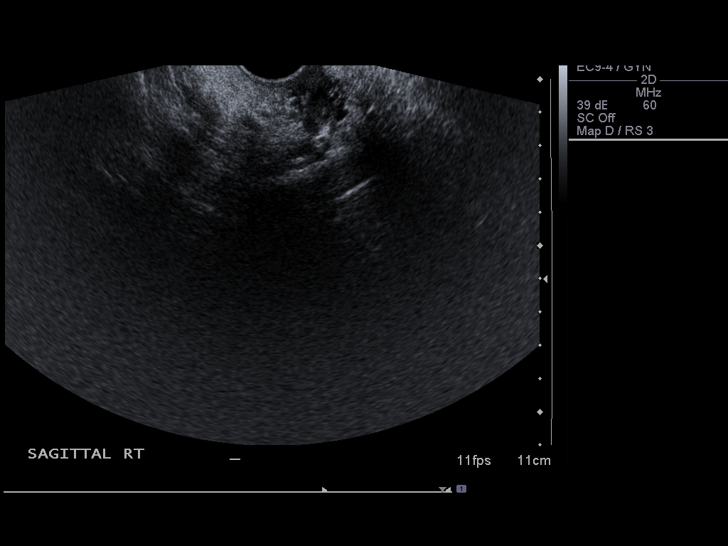

[14 of 25 positions shown; findings below may reference images not displayed]

FINDINGS: Uterus: Prior hysterectomy.

Endometrium: Prior hysterectomy.

Right Ovary: 3.5 x 2.2 x 3.0 cm. Normal size and echotexture.  No
adnexal masses. Arterial and venous blood flow documented.

Left Ovary: 3.6 x 2.7 x 2.5 cm.  4.3 cm cyst within the left ovary.
Arterial and venous blood flow documented.

Other Findings:  No free fluid.
IMPRESSION: No evidence of ovarian torsion.

4.3 cm left ovarian cyst

Prior hysterectomy.

## 2010-11-15 IMAGING — CR DG CHEST 1V
1 series · 1 of 1 positions shown · non-contrast
Comparison: none

REASON FOR EXAM: syncope
COMMENTS:

[view not recorded]
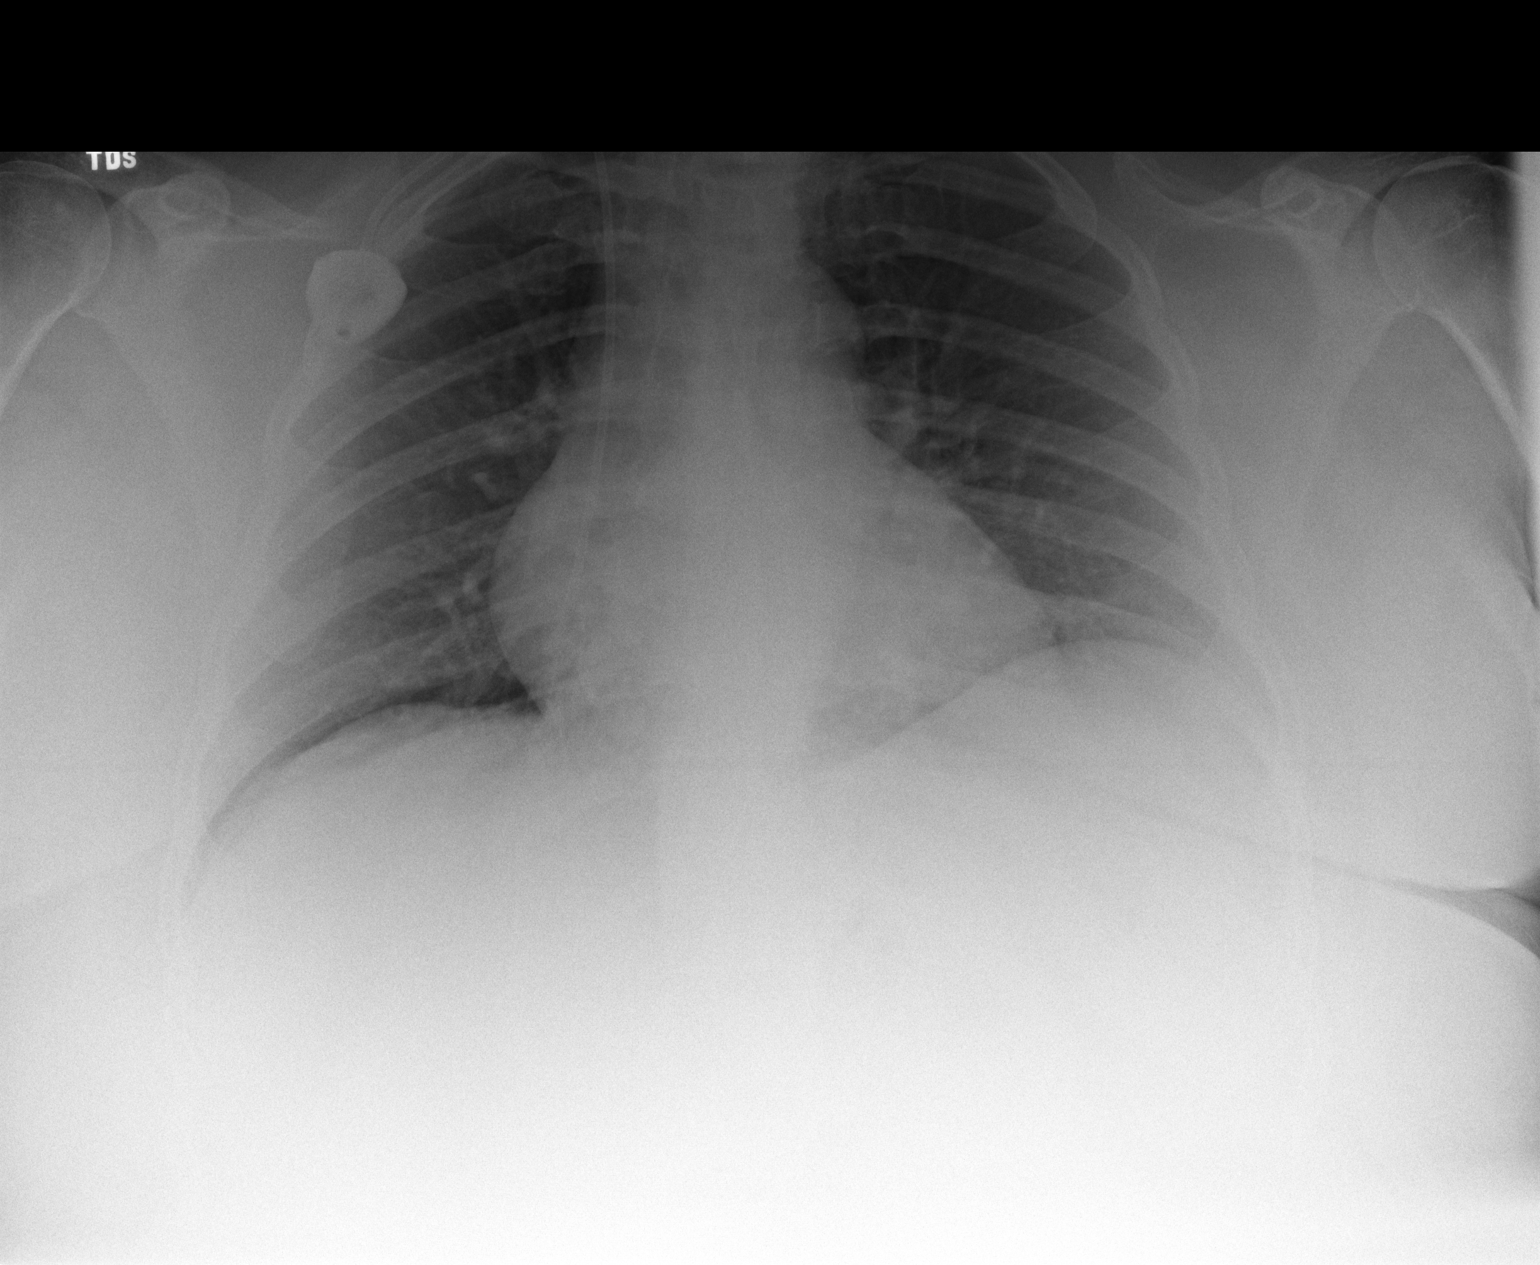

[1 of 1 positions shown; findings below may reference images not displayed]

PROCEDURE:     DXR - DXR CHEST 1 VIEWAP OR PA  - August 17, 2009 [DATE]

RESULT:     Comparison is made to the study 03 August, 2009.

The lungs are mildly hypoinflated. There is no focal infiltrate. The cardiac
silhouette is top normal in size. The central pulmonary vascularity is
prominent. There is a Port-A-Cath appliance in place.
IMPRESSION: I do not see evidence of pneumonia nor definite evidence of
other acute cardiopulmonary abnormality. Mild prominence of the central
pulmonary vascularity is accentuated by mild hypoinflation.

## 2010-11-15 IMAGING — CR DG SHOULDER 3+V*L*
1 series · 3 of 3 positions shown · non-contrast
Comparison: none

REASON FOR EXAM: fall, pain
COMMENTS:

PROCEDURE:     DXR - DXR SHOULDER LEFT COMPLETE  - August 17, 2009 [DATE]
RESULT:     Three views of the left shoulder reveal the bones to be
adequately mineralized. I do not see evidence of an acute fracture. The
overlying soft tissues are grossly normal. The clavicle is intact where
visualized.

[Series 1: view not recorded · 0.17mm/px · 3 of 3 slices shown]
[im 1/3]
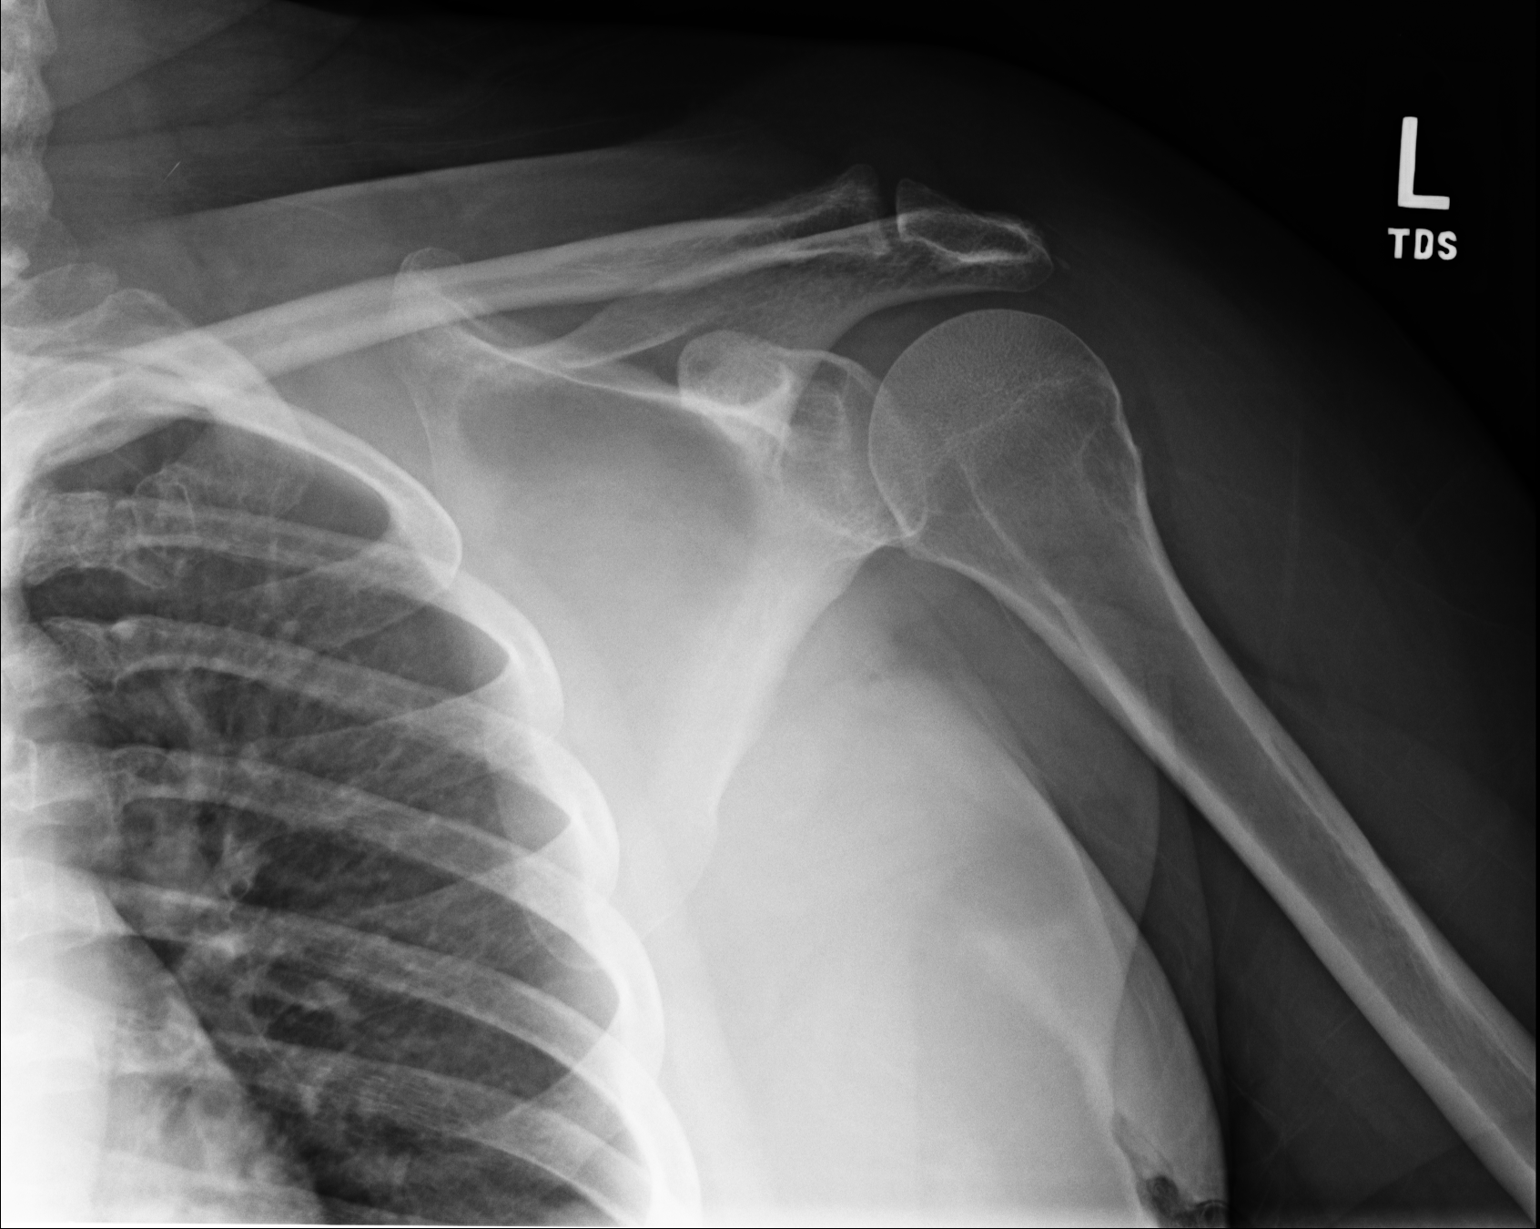
[im 2/3]
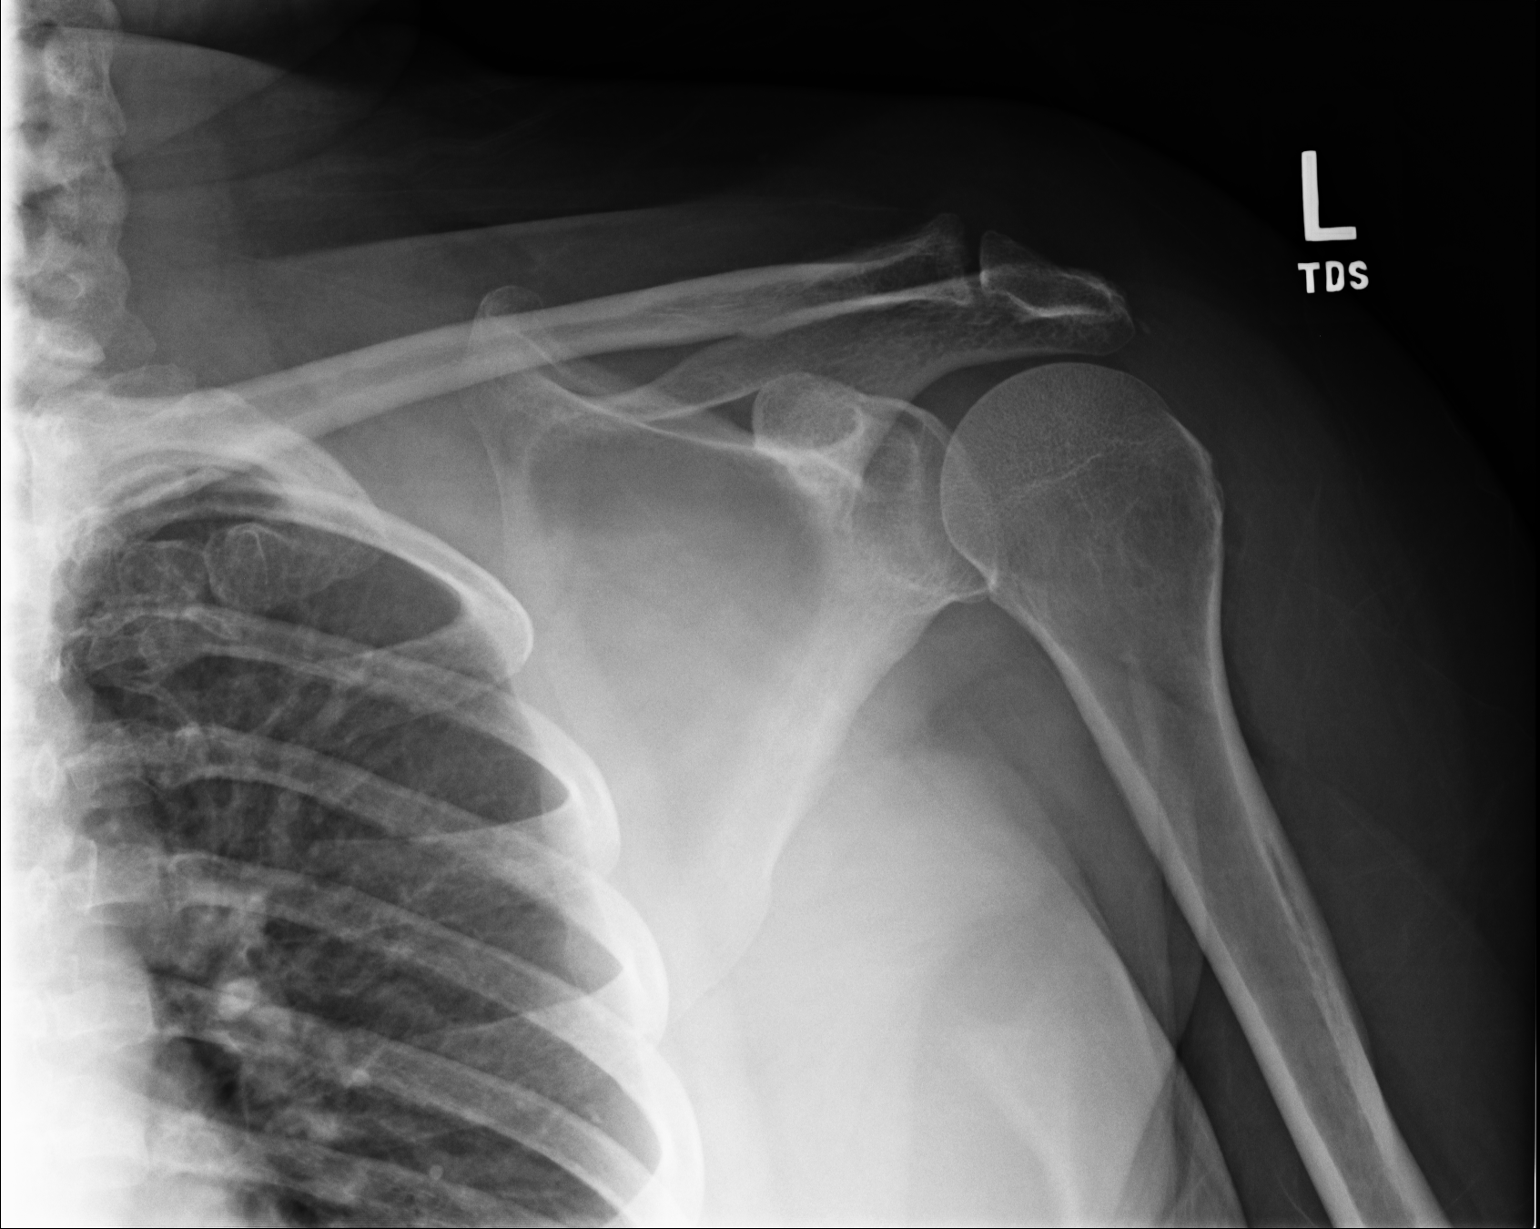
[im 3/3]
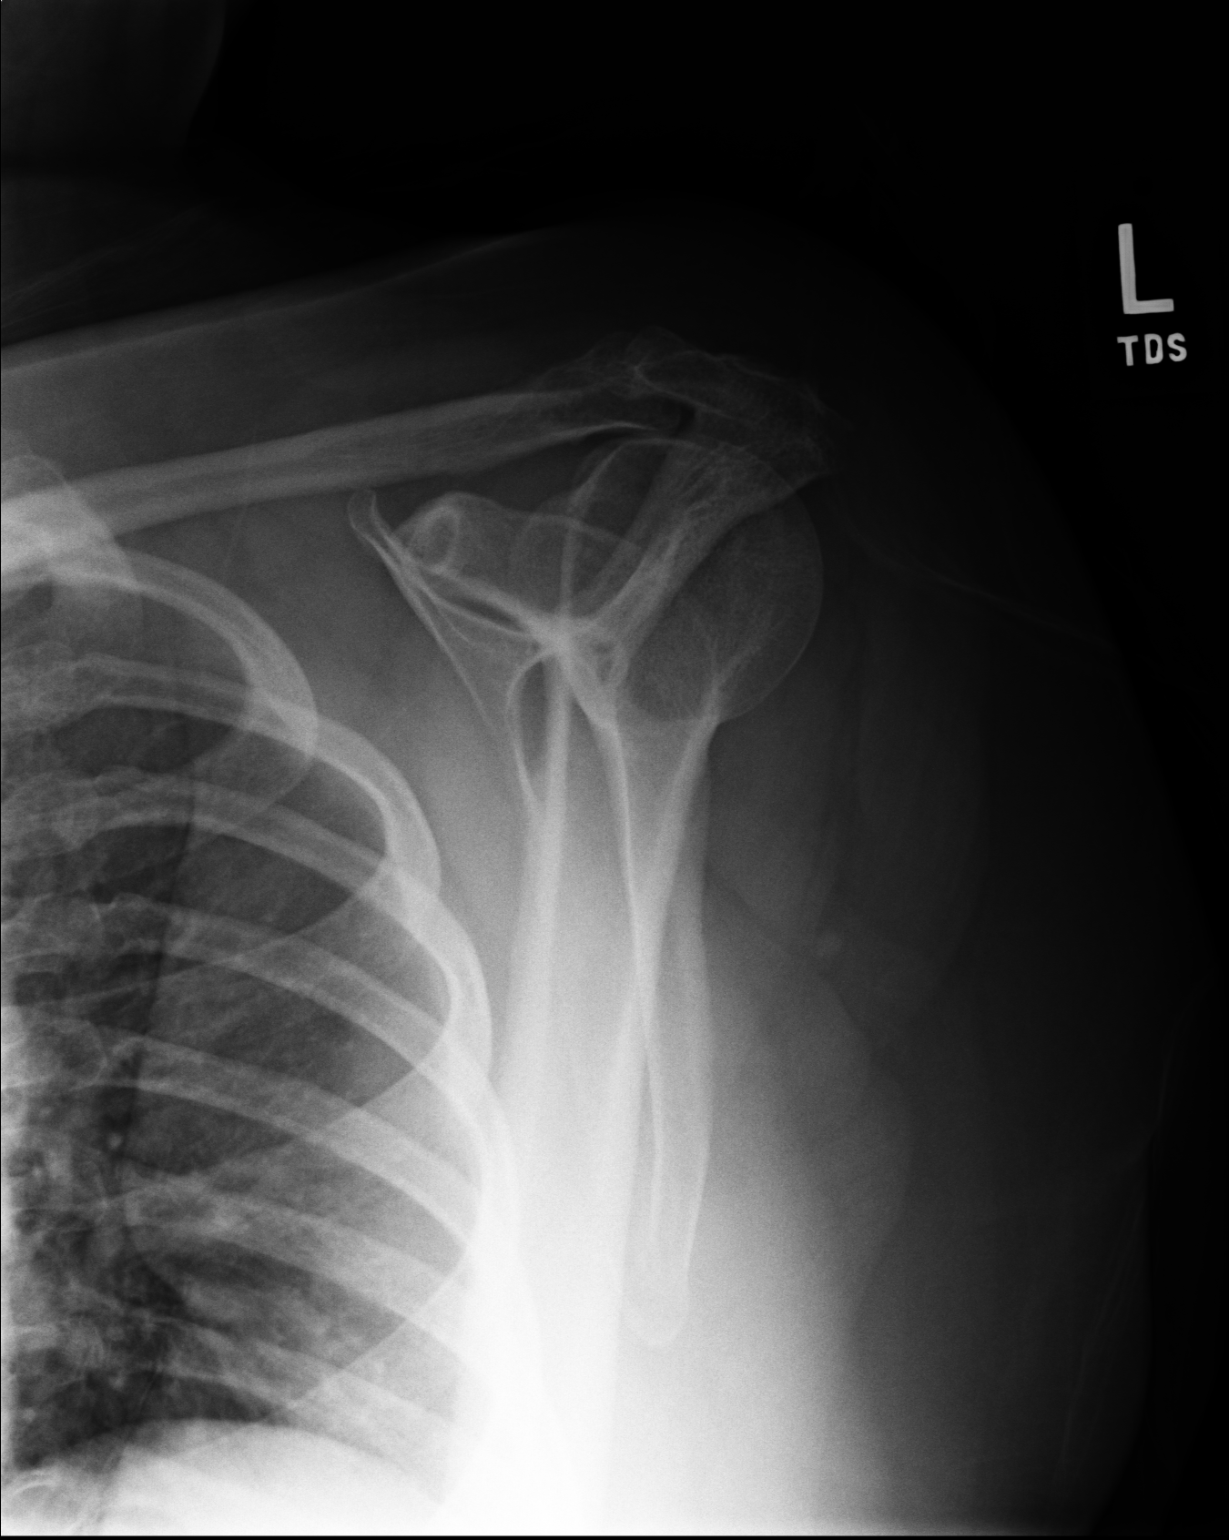

[3 of 3 positions shown; findings below may reference images not displayed]

IMPRESSION: I do not see evidence of an acute fracture or dislocation
of the left shoulder.

## 2010-11-15 IMAGING — CT CT HEAD WITHOUT CONTRAST
2 series · 16 of 30 positions shown, 20 images · non-contrast
Comparison: none

REASON FOR EXAM: fall, syncope
COMMENTS:

PROCEDURE:     CT  - CT HEAD WITHOUT CONTRAST  - August 17, 2009  [DATE]
RESULT:
HISTORY: Fall and syncope.

[Series 2: without · axial · non-contrast · 0.43mm/px · z∈[+190,+310]mm · 13 of 29 slices shown, 17 images]
[im 3/29  brain]
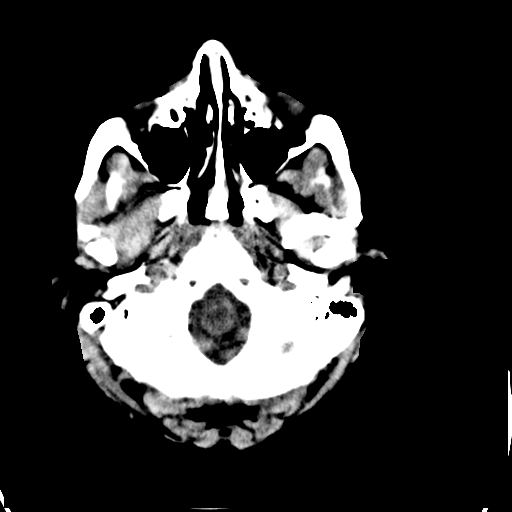
[im 3/29  bone]
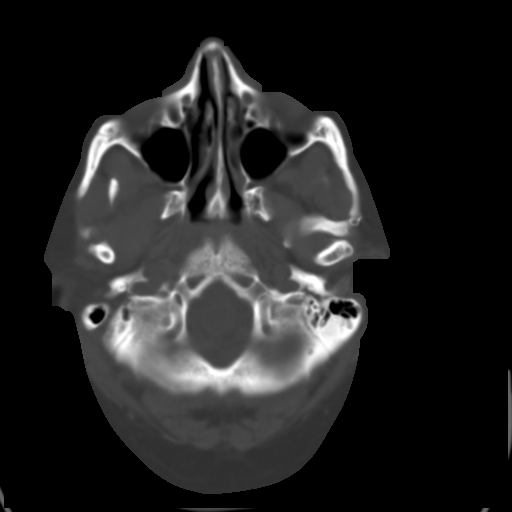
[im 5/29  brain]
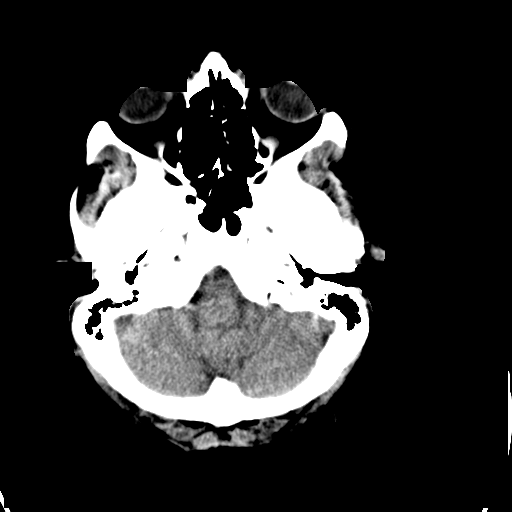
[im 7/29  brain]
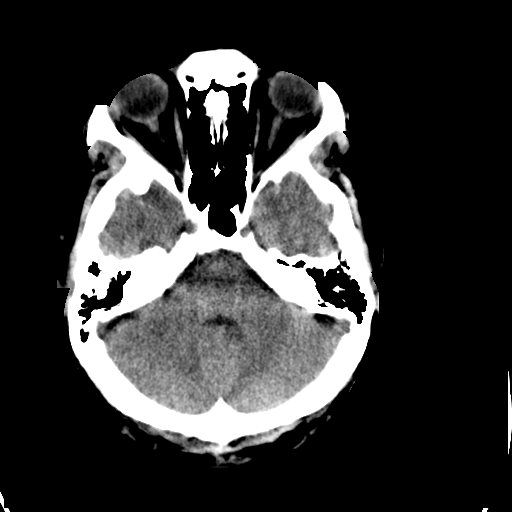
[im 9/29  brain]
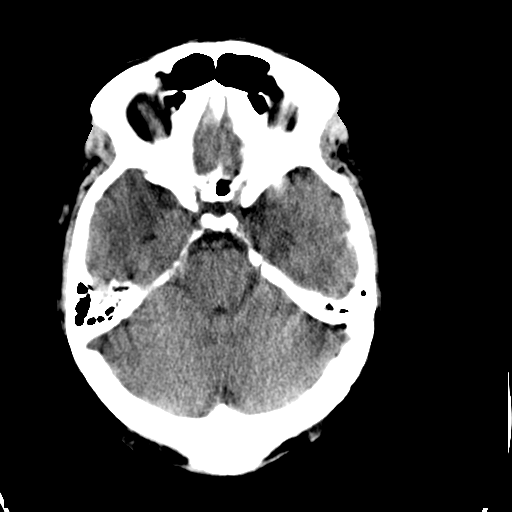
[im 11/29  brain]
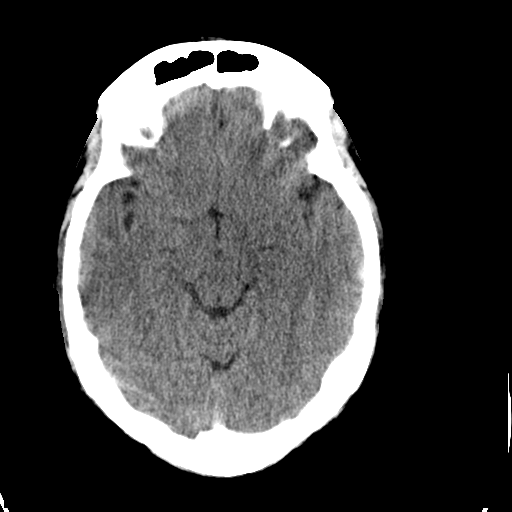
[im 11/29  bone]
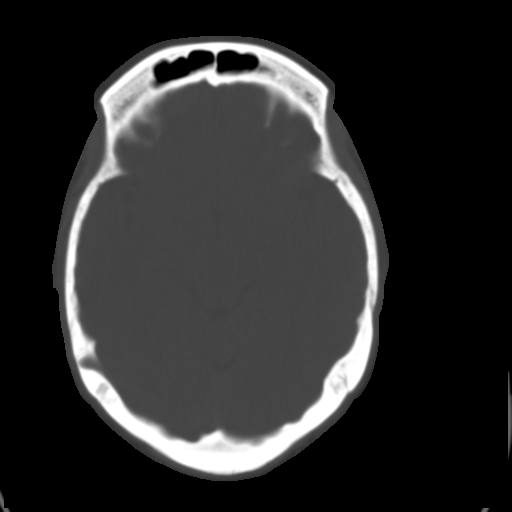
[im 13/29  brain]
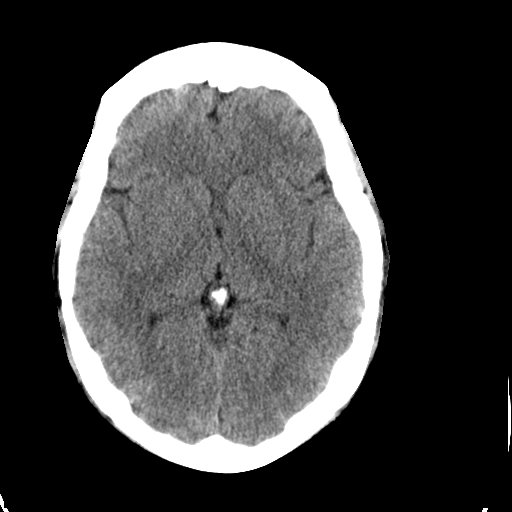
[im 15/29  brain]
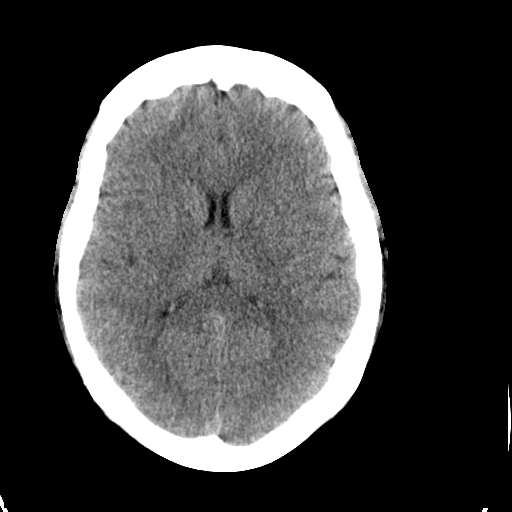
[im 17/29  brain]
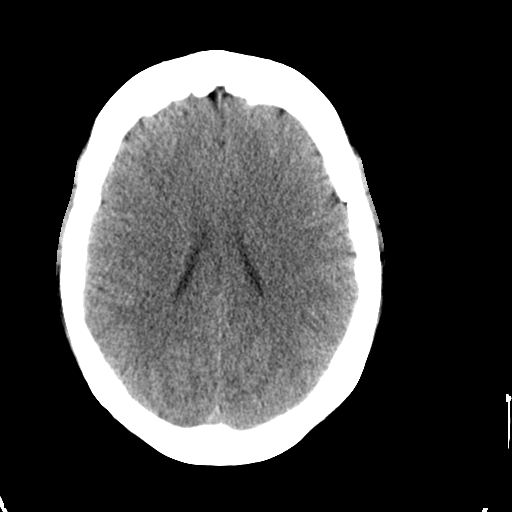
[im 19/29  brain]
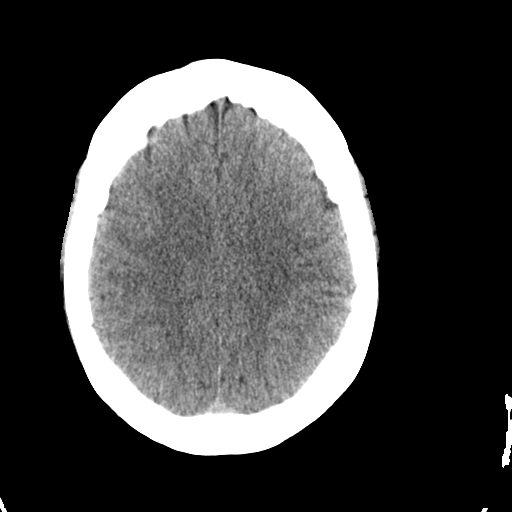
[im 19/29  bone]
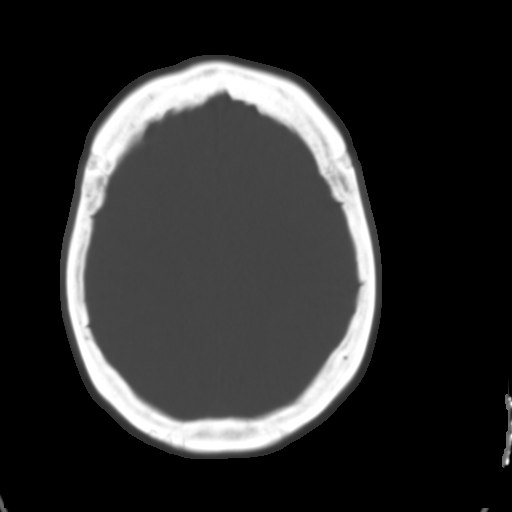
[im 21/29  brain]
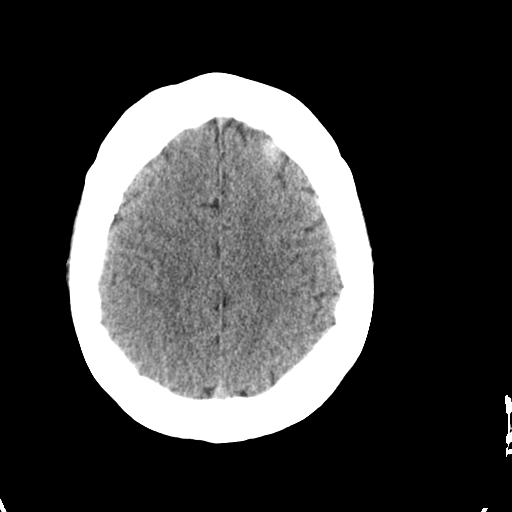
[im 23/29  brain]
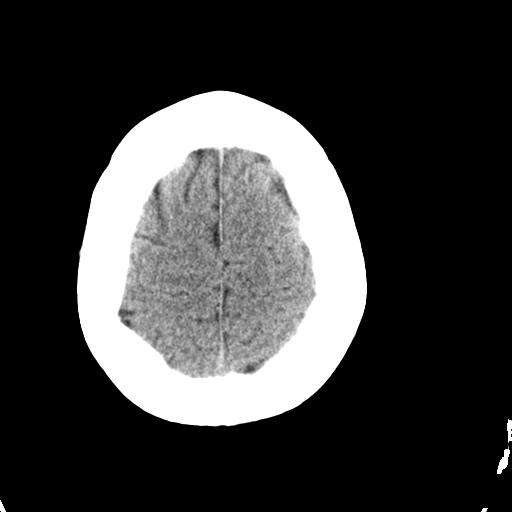
[im 25/29  brain]
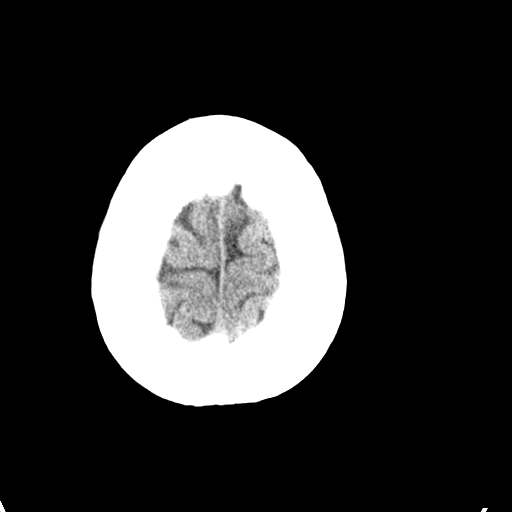
[im 27/29  brain]
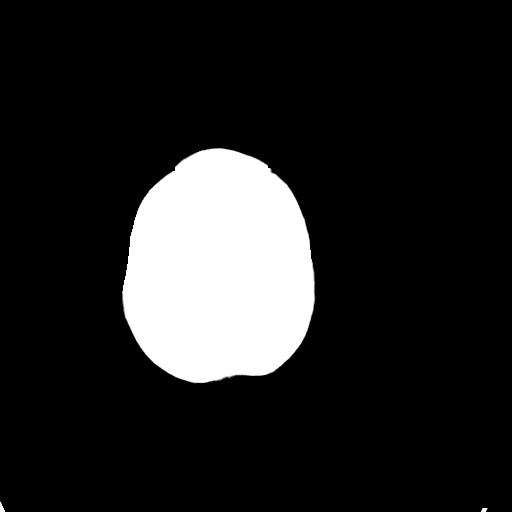
[im 27/29  bone]
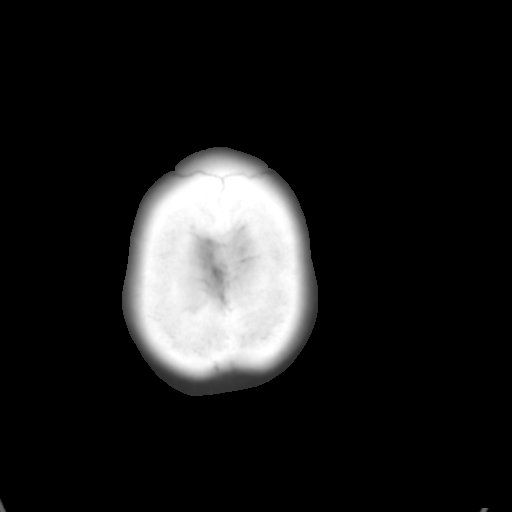

[Series 3: bone · axial · 0.43mm/px · z∈[+190,+230]mm · 3 of 29 slices shown]
[im 3/29  bone]
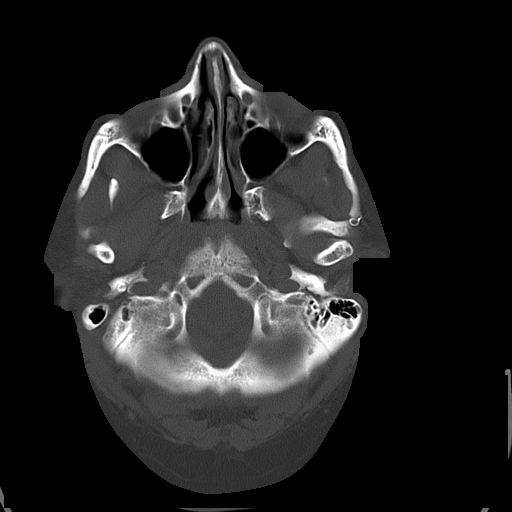
[im 7/29  bone]
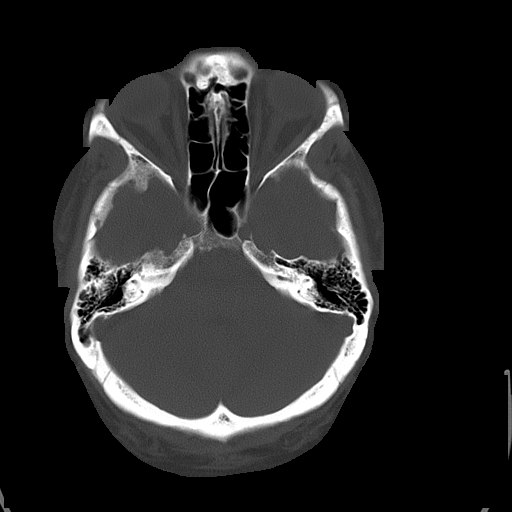
[im 11/29  bone]
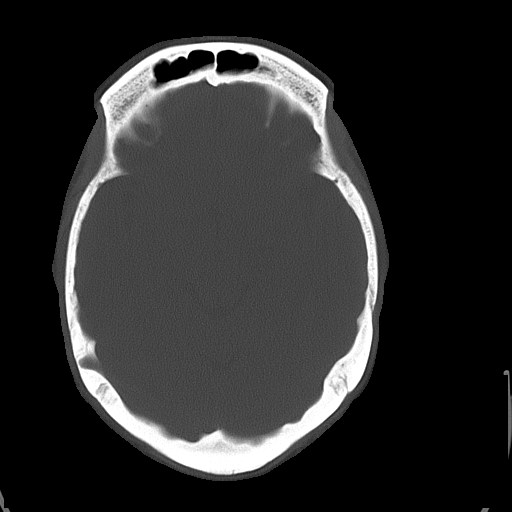

[16 of 30 positions shown; findings below may reference images not displayed]

FINDINGS: Standard nonenhanced head CT was obtained. No mass lesion is
noted. There is no hydrocephalus. No hemorrhage. No acute bony abnormality
is identified.
IMPRESSION: 1.     No acute abnormality. Head CT is stable from 08/03/2009.
2.     Incidental note is made of postsurgical changes of the posterior left
zygomatic arch. This appears to represent wire suture.

## 2010-12-21 IMAGING — CR DG CHEST 2V
2 series · 2 of 2 positions shown · non-contrast
Comparison: 05/18/2009.

CLINICAL DATA: Syncopal episode today.  History of asthma and
hypertension.

CHEST - 2 VIEW

[w chest pa]
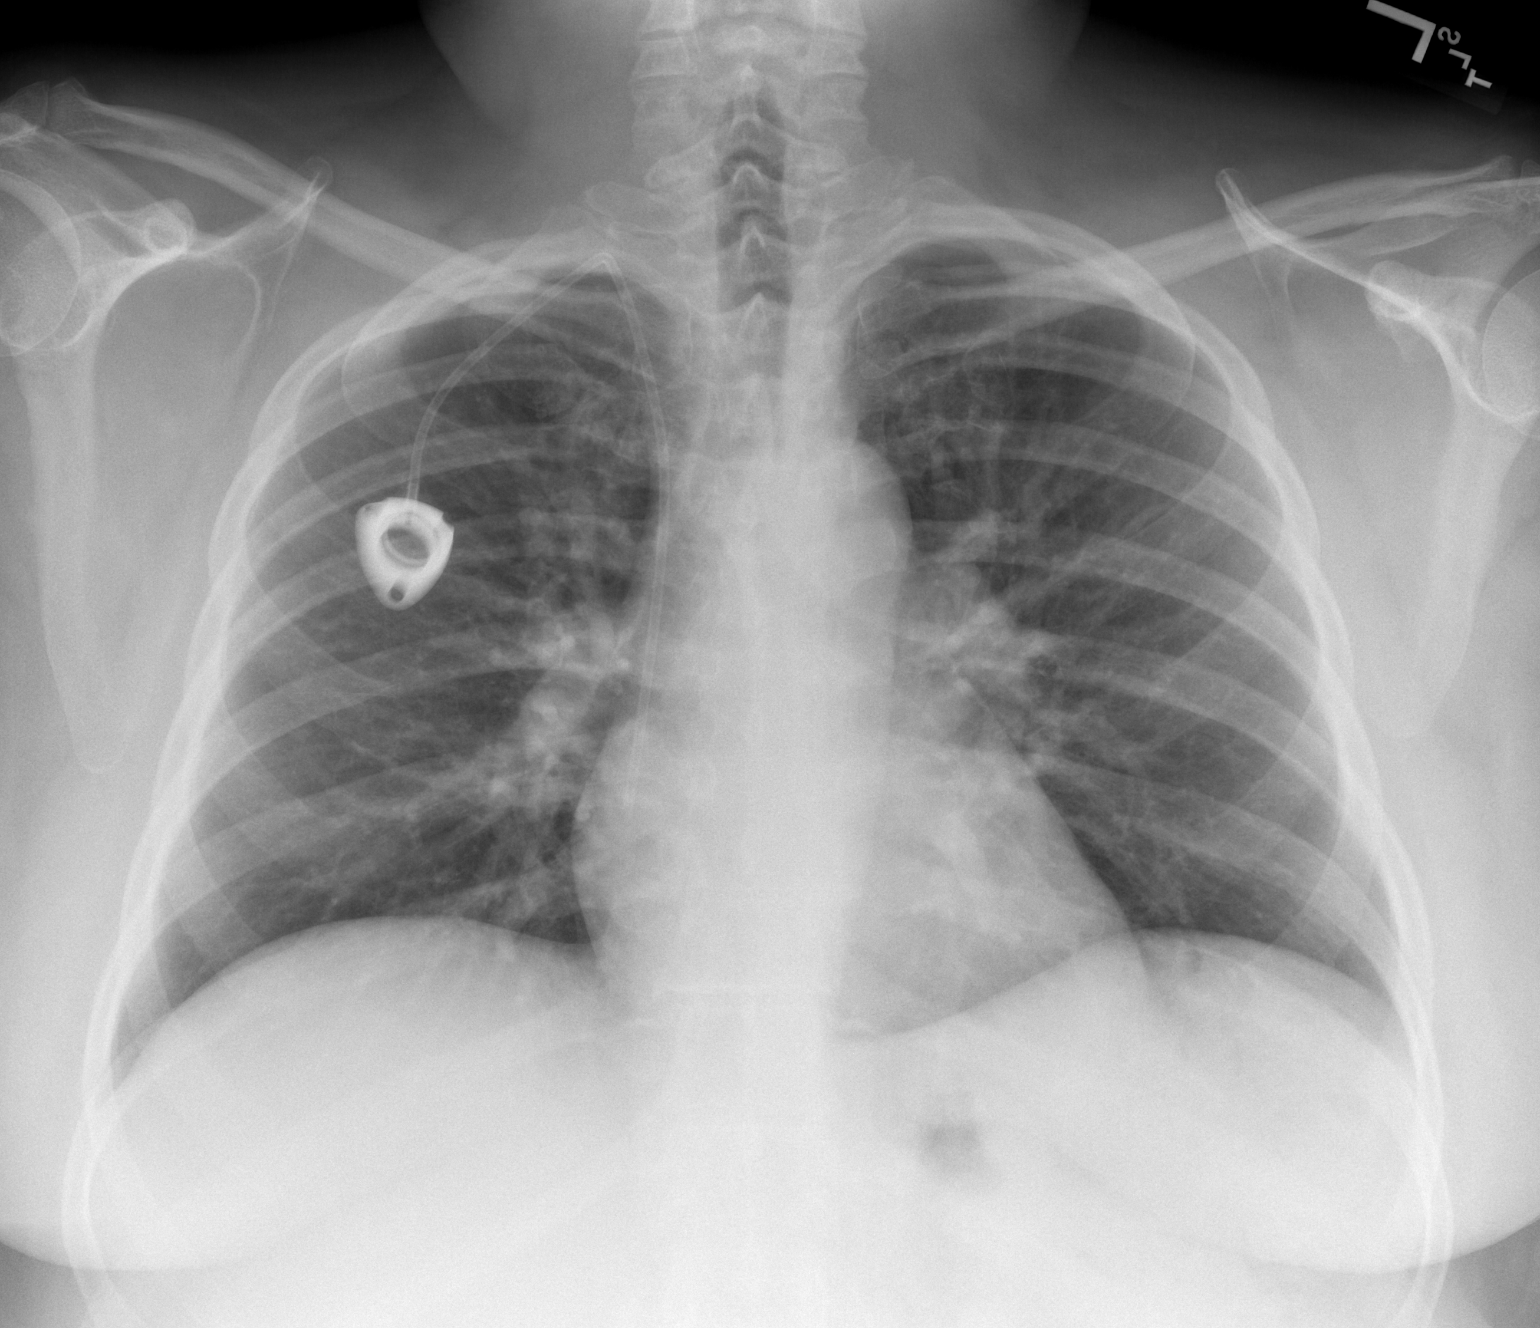

[w chest lat *]
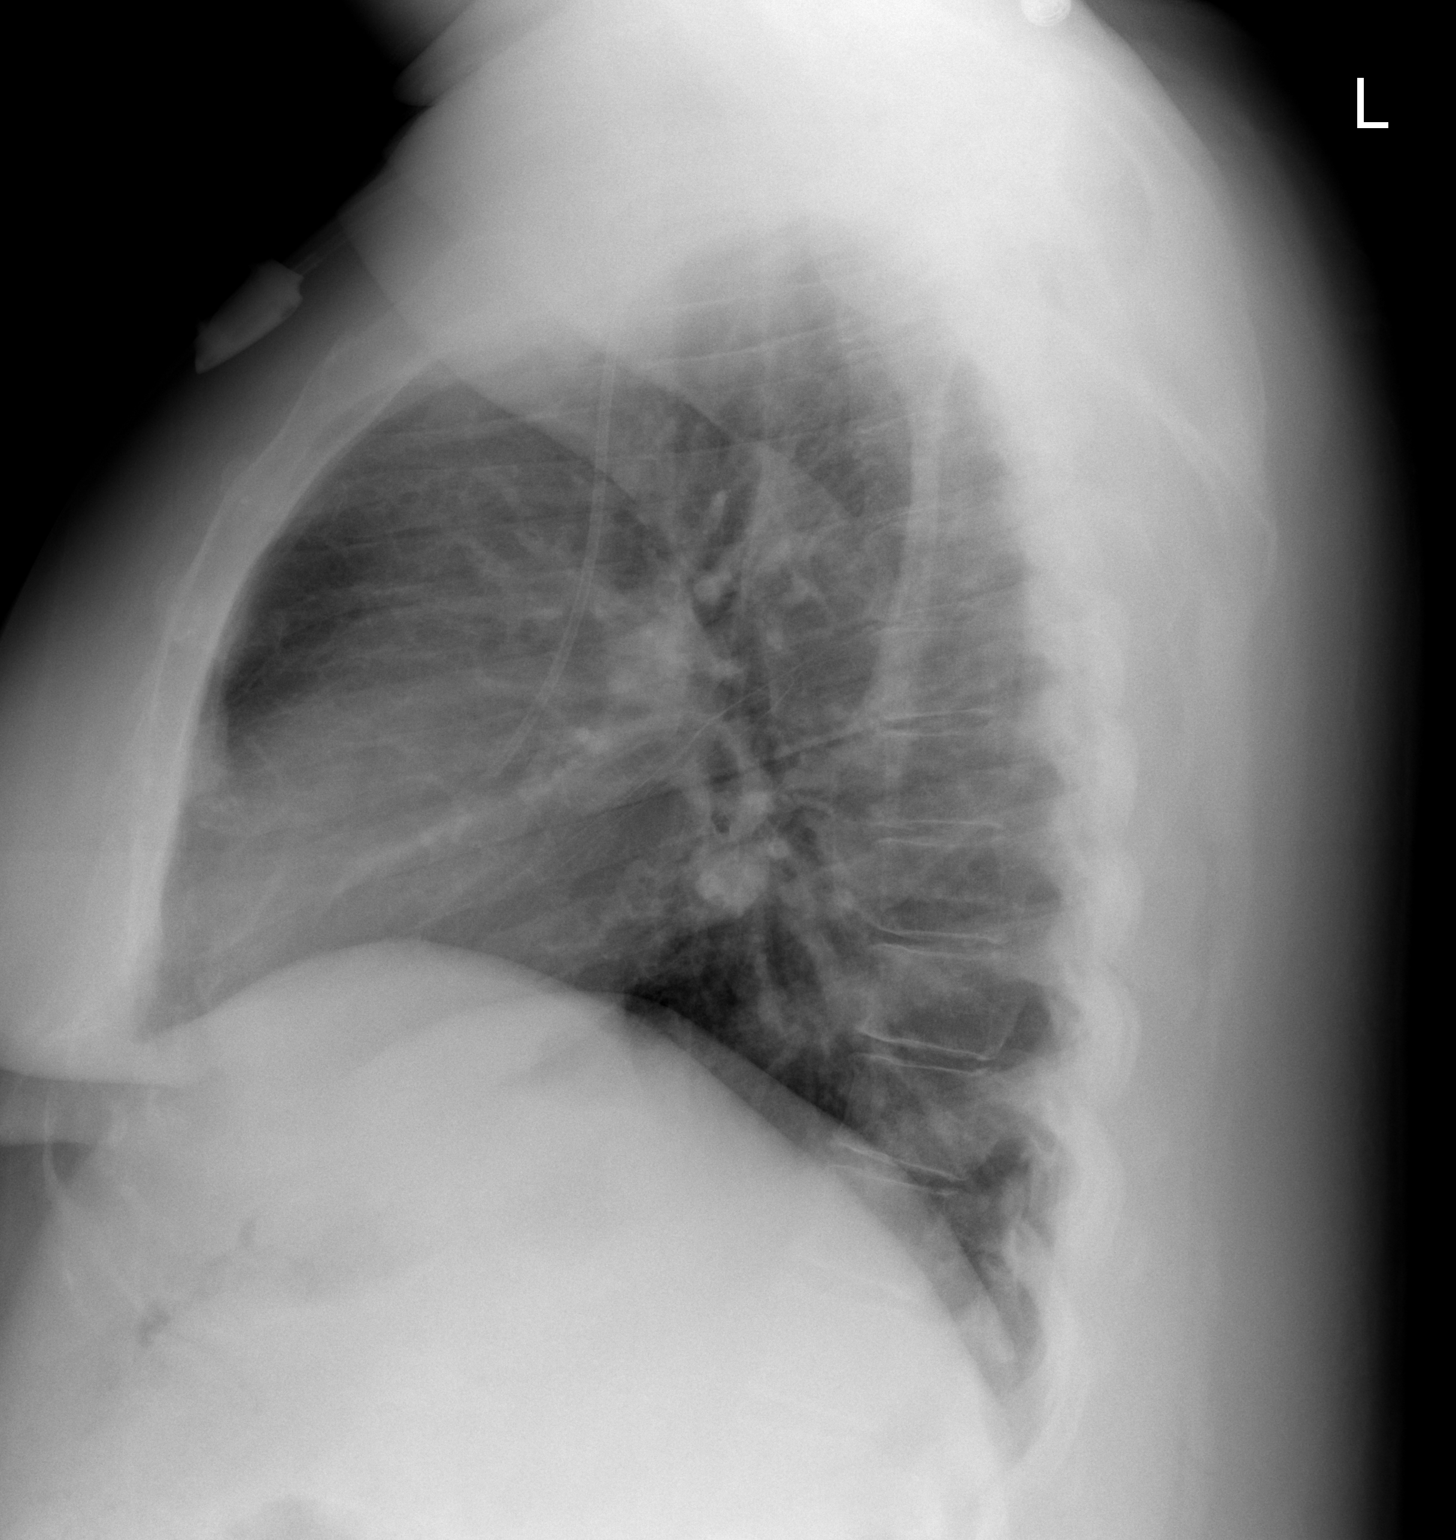

[2 of 2 positions shown; findings below may reference images not displayed]

FINDINGS: Stable normal sized heart, clear lungs and right jugular
porta-catheter.  Minimal thoracic spine degenerative changes.
IMPRESSION: No acute abnormality.

## 2010-12-22 IMAGING — NM NM PULM PERFUSION & VENT (REBREATHING & WASHOUT)
2 series · 12 of 12 positions shown · non-contrast
Comparison: Chest radiographs 09/22/2009

CLINICAL DATA: Chest pain and shortness of breath.  Contrast
allergy.  Question DVT.

NUCLEAR MEDICINE VENTILATION - PERFUSION LUNG SCAN
TECHNIQUE: Wash-in, equilibrium, and wash-out phase ventilation
images were obtained using Le-E11 gas.  Perfusion images were
obtained in multiple projections after intravenous injection of Tc-
99m MAA.
Radiopharmaceuticals:  10 mCi Le-E11 gas and 6.0 mCi Fc-WWm MAA.

[vq scan · 2.52mm/px · 6 of 20 frames shown (1 of 2)]
[frame 2/20  full-range]
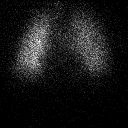
[frame 5/20  full-range]
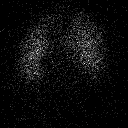
[frame 9/20]
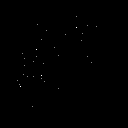
[frame 12/20]
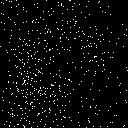
[frame 15/20]
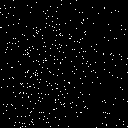
[frame 19/20]
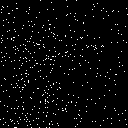

[vq scan · 2.52mm/px · 6 of 20 frames shown (2 of 2)]
[frame 2/20  full-range]
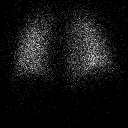
[frame 5/20]
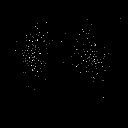
[frame 9/20]
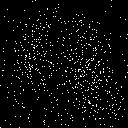
[frame 12/20]
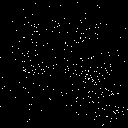
[frame 15/20]
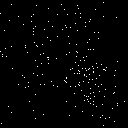
[frame 19/20]
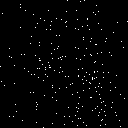

[12 of 12 positions shown; findings below may reference images not displayed]

FINDINGS: The ventilation images are normal.  There is no
significant retention on the washout images.  The perfusion images
are normal.  There are no segmental mismatches.
IMPRESSION: Normal examination.  No evidence of pulmonary embolism.

## 2011-01-11 LAB — BASIC METABOLIC PANEL
BUN: 5 — ABNORMAL LOW
CO2: 22
Calcium: 8.8
Chloride: 107
Creatinine, Ser: 0.74
GFR calc Af Amer: >60
GFR calc non Af Amer: >60
Glucose, Bld: 98
Potassium: 3.4 — ABNORMAL LOW
Sodium: 141

## 2011-01-11 LAB — COMPREHENSIVE METABOLIC PANEL
ALT: 15
ALT: 15
ALT: 16
AST: 14
AST: 20
AST: 13
Albumin: 2.9 — ABNORMAL LOW
Albumin: 3.5
Albumin: 3.4 — ABNORMAL LOW
Alkaline Phosphatase: 57
Alkaline Phosphatase: 64
Alkaline Phosphatase: 58
BUN: 2 — ABNORMAL LOW
BUN: 5 — ABNORMAL LOW
BUN: 4 — ABNORMAL LOW
CO2: 23
CO2: 23
CO2: 28
Calcium: 8.2 — ABNORMAL LOW
Calcium: 9.1
Calcium: 9.2
Chloride: 103
Chloride: 108
Chloride: 102
Creatinine, Ser: 0.62
Creatinine, Ser: 0.7
Creatinine, Ser: 0.71
GFR calc Af Amer: >60
GFR calc Af Amer: >60
GFR calc Af Amer: >60
GFR calc non Af Amer: >60
GFR calc non Af Amer: >60
GFR calc non Af Amer: >60
Glucose, Bld: 94
Glucose, Bld: 99
Glucose, Bld: 99
Potassium: 3 — ABNORMAL LOW
Potassium: 4
Potassium: 3.7
Sodium: 137
Sodium: 138
Sodium: 137
Total Bilirubin: 0.3
Total Bilirubin: 0.5
Total Bilirubin: 0.6
Total Protein: 5.9 — ABNORMAL LOW
Total Protein: 6.9
Total Protein: 6.8

## 2011-01-11 LAB — RAPID URINE DRUG SCREEN, HOSP PERFORMED
Amphetamines: NOT DETECTED
Barbiturates: NOT DETECTED
Benzodiazepines: NOT DETECTED
Cocaine: NOT DETECTED
Opiates: NOT DETECTED
Tetrahydrocannabinol: NOT DETECTED

## 2011-01-11 LAB — TRICYCLICS SCREEN, URINE: TCA Scrn: POSITIVE — AB

## 2011-01-11 LAB — DIFFERENTIAL
Basophils Absolute: 0
Basophils Absolute: 0
Basophils Relative: 0
Basophils Relative: 0
Eosinophils Absolute: 0.1
Eosinophils Absolute: 0.1
Eosinophils Relative: 1
Eosinophils Relative: 2
Lymphocytes Relative: 34
Lymphocytes Relative: 53 — ABNORMAL HIGH
Lymphs Abs: 1.9
Lymphs Abs: 2.6
Monocytes Absolute: 0.3
Monocytes Absolute: 0.4
Monocytes Relative: 6
Monocytes Relative: 7
Neutro Abs: 1.9
Neutro Abs: 3.2
Neutrophils Relative %: 39 — ABNORMAL LOW
Neutrophils Relative %: 58

## 2011-01-11 LAB — CBC
HCT: 32.3 — ABNORMAL LOW
HCT: 36
HCT: 37.4
Hemoglobin: 11.1 — ABNORMAL LOW
Hemoglobin: 12.4
Hemoglobin: 13
MCHC: 34.5
MCHC: 34.6
MCHC: 34.8
MCV: 94.6
MCV: 94.9
MCV: 95
Platelets: 216
Platelets: 260
Platelets: 269
RBC: 3.4 — ABNORMAL LOW
RBC: 3.79 — ABNORMAL LOW
RBC: 3.96
RDW: 13.6
RDW: 13.6
RDW: 13.7
WBC: 4.9
WBC: 5.5
WBC: 5.8

## 2011-01-11 LAB — PREGNANCY, URINE: Preg Test, Ur: NEGATIVE

## 2011-01-11 LAB — SALICYLATE LEVEL: Salicylate Lvl: <4

## 2011-01-11 LAB — CARDIAC PANEL(CRET KIN+CKTOT+MB+TROPI)
CK, MB: 0.4
Relative Index: INVALID
Total CK: 26
Troponin I: 0.01

## 2011-01-11 LAB — ACETAMINOPHEN LEVEL: Acetaminophen (Tylenol), Serum: <10 — ABNORMAL LOW

## 2011-01-11 LAB — VALPROIC ACID LEVEL: Valproic Acid Lvl: 71.2

## 2011-01-11 LAB — ETHANOL: Alcohol, Ethyl (B): <5

## 2011-01-14 LAB — POCT I-STAT, CHEM 8
BUN: 8
BUN: 5 — ABNORMAL LOW
Calcium, Ion: 1.16
Calcium, Ion: 1.12
Chloride: 102
Chloride: 108
Creatinine, Ser: 0.6
Creatinine, Ser: 0.7
Glucose, Bld: 100 — ABNORMAL HIGH
Glucose, Bld: 90
HCT: 37
HCT: 42
Hemoglobin: 12.6
Hemoglobin: 14.3
Potassium: 3.4 — ABNORMAL LOW
Potassium: 3.1 — ABNORMAL LOW
Sodium: 137
Sodium: 139
TCO2: 26
TCO2: 20

## 2011-01-14 LAB — DIFFERENTIAL
Basophils Absolute: 0
Basophils Relative: 0
Eosinophils Absolute: 0.1
Eosinophils Relative: 1
Lymphocytes Relative: 37
Lymphs Abs: 3.2
Monocytes Absolute: 0.6
Monocytes Relative: 7
Neutro Abs: 4.9
Neutrophils Relative %: 56

## 2011-01-14 LAB — POCT PREGNANCY, URINE
Operator id: 222501
Preg Test, Ur: NEGATIVE

## 2011-01-14 LAB — URINALYSIS, ROUTINE W REFLEX MICROSCOPIC
Bilirubin Urine: NEGATIVE
Glucose, UA: NEGATIVE
Hgb urine dipstick: NEGATIVE
Ketones, ur: NEGATIVE
Nitrite: NEGATIVE
Protein, ur: NEGATIVE
Specific Gravity, Urine: 1.015
Urobilinogen, UA: 0.2
pH: 6

## 2011-01-14 LAB — VALPROIC ACID LEVEL: Valproic Acid Lvl: <10 — ABNORMAL LOW

## 2011-01-14 LAB — CBC
HCT: 35 — ABNORMAL LOW
Hemoglobin: 12.6
MCHC: 35.9
MCV: 92.9
Platelets: 293
RBC: 3.77 — ABNORMAL LOW
RDW: 13.6
WBC: 8.8

## 2011-01-14 LAB — POCT CARDIAC MARKERS
CKMB, poc: <1 — ABNORMAL LOW
Myoglobin, poc: 34.4
Operator id: 192351
Troponin i, poc: 0.08 — ABNORMAL HIGH

## 2011-01-14 LAB — PROTIME-INR
INR: 0.9
Prothrombin Time: 12.8

## 2011-01-14 LAB — TROPONIN I: Troponin I: 0.01

## 2011-01-14 LAB — RAPID URINE DRUG SCREEN, HOSP PERFORMED
Amphetamines: NOT DETECTED
Barbiturates: NOT DETECTED
Benzodiazepines: NOT DETECTED
Cocaine: NOT DETECTED
Opiates: NOT DETECTED
Tetrahydrocannabinol: NOT DETECTED

## 2011-01-14 LAB — CK TOTAL AND CKMB (NOT AT ARMC)
CK, MB: 0.8
Relative Index: INVALID
Total CK: 58

## 2011-01-14 LAB — D-DIMER, QUANTITATIVE: D-Dimer, Quant: 0.42

## 2011-01-14 LAB — APTT: aPTT: 27

## 2011-01-18 LAB — DIFFERENTIAL
Basophils Absolute: 0
Basophils Relative: 0
Eosinophils Absolute: 0.1
Eosinophils Relative: 1
Lymphocytes Relative: 47 — ABNORMAL HIGH
Lymphs Abs: 3.1
Monocytes Absolute: 0.4
Monocytes Relative: 6
Neutro Abs: 2.9
Neutrophils Relative %: 45

## 2011-01-18 LAB — URINALYSIS, ROUTINE W REFLEX MICROSCOPIC
Bilirubin Urine: NEGATIVE
Glucose, UA: NEGATIVE
Hgb urine dipstick: NEGATIVE
Ketones, ur: NEGATIVE
Nitrite: NEGATIVE
Protein, ur: NEGATIVE
Specific Gravity, Urine: 1.01
Urobilinogen, UA: 0.2
pH: 7

## 2011-01-18 LAB — COMPREHENSIVE METABOLIC PANEL
ALT: 14
AST: 15
Albumin: 3.3 — ABNORMAL LOW
Alkaline Phosphatase: 69
BUN: 6
CO2: 24
Calcium: 8.7
Chloride: 104
Creatinine, Ser: 0.64
GFR calc Af Amer: >60
GFR calc non Af Amer: >60
Glucose, Bld: 84
Potassium: 3.5
Sodium: 137
Total Bilirubin: 0.6
Total Protein: 6.5

## 2011-01-18 LAB — CBC
HCT: 38.2
Hemoglobin: 13.3
MCHC: 34.8
MCV: 92.7
Platelets: 334
RBC: 4.12
RDW: 14
WBC: 6.5

## 2011-01-18 LAB — PREGNANCY, URINE: Preg Test, Ur: NEGATIVE

## 2011-01-18 LAB — VALPROIC ACID LEVEL: Valproic Acid Lvl: 50.4

## 2011-01-19 LAB — COMPREHENSIVE METABOLIC PANEL
ALT: 10
ALT: 14
AST: 13
AST: 24
Albumin: 2.9 — ABNORMAL LOW
Albumin: 3.4 — ABNORMAL LOW
Alkaline Phosphatase: 52
Alkaline Phosphatase: 58
BUN: 2 — ABNORMAL LOW
BUN: 3 — ABNORMAL LOW
CO2: 24
CO2: 24
Calcium: 8.1 — ABNORMAL LOW
Calcium: 9.2
Chloride: 101
Chloride: 107
Creatinine, Ser: 0.67
Creatinine, Ser: 0.72
GFR calc Af Amer: >60
GFR calc Af Amer: >60
GFR calc non Af Amer: >60
GFR calc non Af Amer: >60
Glucose, Bld: 103 — ABNORMAL HIGH
Glucose, Bld: 90
Potassium: 3.3 — ABNORMAL LOW
Potassium: 3.7
Sodium: 138
Sodium: 139
Total Bilirubin: 0.4
Total Bilirubin: 0.5
Total Protein: 5.5 — ABNORMAL LOW
Total Protein: 6.3

## 2011-01-19 LAB — URINALYSIS, ROUTINE W REFLEX MICROSCOPIC
Bilirubin Urine: NEGATIVE
Glucose, UA: NEGATIVE
Hgb urine dipstick: NEGATIVE
Ketones, ur: 15 — AB
Nitrite: NEGATIVE
Protein, ur: NEGATIVE
Specific Gravity, Urine: 1.008
Urobilinogen, UA: 0.2
pH: 6

## 2011-01-19 LAB — CBC
HCT: 33.4 — ABNORMAL LOW
HCT: 37.8
Hemoglobin: 11.6 — ABNORMAL LOW
Hemoglobin: 13.1
MCHC: 34.5
MCHC: 34.6
MCV: 95.7
MCV: 95.7
Platelets: 271
Platelets: 295
RBC: 3.49 — ABNORMAL LOW
RBC: 3.95
RDW: 13.9
RDW: 13.9
WBC: 6.9
WBC: 9.2

## 2011-01-19 LAB — DIFFERENTIAL
Basophils Absolute: 0
Basophils Relative: 0
Eosinophils Absolute: 0
Eosinophils Relative: 0
Lymphocytes Relative: 32
Lymphs Abs: 2.9
Monocytes Absolute: 0.5
Monocytes Relative: 6
Neutro Abs: 5.7
Neutrophils Relative %: 62

## 2011-01-19 LAB — CARDIAC PANEL(CRET KIN+CKTOT+MB+TROPI)
CK, MB: 0.7
Relative Index: INVALID
Total CK: 60
Troponin I: 0.01

## 2011-01-19 LAB — URINE CULTURE
Colony Count: NO GROWTH
Culture: NO GROWTH

## 2011-01-19 LAB — GLUCOSE, CAPILLARY: Glucose-Capillary: 104 — ABNORMAL HIGH

## 2011-01-19 LAB — VALPROIC ACID LEVEL
Valproic Acid Lvl: 125.8 — ABNORMAL HIGH
Valproic Acid Lvl: 85.1

## 2011-01-19 LAB — POCT PREGNANCY, URINE: Preg Test, Ur: NEGATIVE

## 2011-01-19 LAB — RAPID URINE DRUG SCREEN, HOSP PERFORMED
Amphetamines: NOT DETECTED
Barbiturates: NOT DETECTED
Benzodiazepines: NOT DETECTED
Cocaine: POSITIVE — AB
Opiates: NOT DETECTED
Tetrahydrocannabinol: NOT DETECTED

## 2011-01-19 LAB — ETHANOL: Alcohol, Ethyl (B): <5

## 2011-01-19 LAB — SALICYLATE LEVEL: Salicylate Lvl: <4

## 2011-01-19 LAB — ACETAMINOPHEN LEVEL: Acetaminophen (Tylenol), Serum: <10 — ABNORMAL LOW

## 2011-01-22 LAB — BASIC METABOLIC PANEL
BUN: 6
CO2: 26
Calcium: 8.8
Chloride: 103
Creatinine, Ser: 0.64
GFR calc Af Amer: >60
GFR calc non Af Amer: >60
Glucose, Bld: 100 — ABNORMAL HIGH
Potassium: 3.5
Sodium: 137

## 2011-01-22 LAB — DIFFERENTIAL
Basophils Absolute: 0
Basophils Absolute: 0
Basophils Relative: 0
Basophils Relative: 0
Eosinophils Absolute: 0.1 — ABNORMAL LOW
Eosinophils Absolute: 0.1 — ABNORMAL LOW
Eosinophils Relative: 1
Eosinophils Relative: 2
Lymphocytes Relative: 26
Lymphocytes Relative: 38
Lymphs Abs: 2.2
Lymphs Abs: 1.8
Monocytes Absolute: 0.7
Monocytes Absolute: 0.3
Monocytes Relative: 8
Monocytes Relative: 6
Neutro Abs: 5.5
Neutro Abs: 2.6
Neutrophils Relative %: 64
Neutrophils Relative %: 54

## 2011-01-22 LAB — COMPREHENSIVE METABOLIC PANEL
ALT: 20
AST: 22
Albumin: 3.3 — ABNORMAL LOW
Alkaline Phosphatase: 65
BUN: 4 — ABNORMAL LOW
CO2: 23
Calcium: 9.1
Chloride: 105
Creatinine, Ser: 0.72
GFR calc Af Amer: >60
GFR calc non Af Amer: >60
Glucose, Bld: 93
Potassium: 4.4
Sodium: 136
Total Bilirubin: 0.9
Total Protein: 6.8

## 2011-01-22 LAB — CBC
HCT: 39.9
HCT: 34.5 — ABNORMAL LOW
Hemoglobin: 13.7
Hemoglobin: 12.2
MCHC: 34.3
MCHC: 35.2
MCV: 94.5
MCV: 92.9
Platelets: 297
Platelets: 263
RBC: 4.23
RBC: 3.72 — ABNORMAL LOW
RDW: 13.6
RDW: 13.7
WBC: 8.6
WBC: 4.9

## 2011-01-22 LAB — RAPID URINE DRUG SCREEN, HOSP PERFORMED
Amphetamines: NOT DETECTED
Barbiturates: NOT DETECTED
Benzodiazepines: NOT DETECTED
Cocaine: NOT DETECTED
Opiates: POSITIVE — AB
Tetrahydrocannabinol: NOT DETECTED

## 2011-01-22 LAB — VALPROIC ACID LEVEL
Valproic Acid Lvl: 75.4
Valproic Acid Lvl: 81.8

## 2011-01-22 LAB — PREGNANCY, URINE: Preg Test, Ur: NEGATIVE

## 2011-01-22 LAB — LIPASE, BLOOD: Lipase: 27
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
## 2011-01-27 LAB — CK TOTAL AND CKMB (NOT AT ARMC)
CK, MB: 0.5
CK, MB: 0.5
CK, MB: 0.6
Relative Index: INVALID
Relative Index: INVALID
Relative Index: INVALID
Total CK: 27
Total CK: 29
Total CK: 59

## 2011-01-27 LAB — BASIC METABOLIC PANEL
BUN: 5 — ABNORMAL LOW
CO2: 30
Calcium: 8.6
Chloride: 101
Creatinine, Ser: 0.51
GFR calc Af Amer: >60
GFR calc non Af Amer: >60
Glucose, Bld: 75
Potassium: 3.8
Sodium: 136

## 2011-01-27 LAB — CBC
HCT: 33.9 — ABNORMAL LOW
HCT: 38.4
Hemoglobin: 11.6 — ABNORMAL LOW
Hemoglobin: 13.1
MCHC: 33.9
MCHC: 34.3
MCV: 94
MCV: 95.2
Platelets: 276
Platelets: 296
RBC: 3.61 — ABNORMAL LOW
RBC: 4.04
RDW: 13.8
RDW: 14.3 — ABNORMAL HIGH
WBC: 5.9
WBC: 6.8

## 2011-01-27 LAB — RAPID URINE DRUG SCREEN, HOSP PERFORMED
Amphetamines: NOT DETECTED
Barbiturates: NOT DETECTED
Benzodiazepines: NOT DETECTED
Cocaine: NOT DETECTED
Opiates: NOT DETECTED
Tetrahydrocannabinol: NOT DETECTED

## 2011-01-27 LAB — COMPREHENSIVE METABOLIC PANEL
ALT: 10
AST: 18
Albumin: 3.1 — ABNORMAL LOW
Alkaline Phosphatase: 61
BUN: 7
CO2: 24
Calcium: 8.4
Chloride: 101
Creatinine, Ser: 0.59
GFR calc Af Amer: >60
GFR calc non Af Amer: >60
Glucose, Bld: 82
Potassium: 4.1
Sodium: 135
Total Bilirubin: 0.6
Total Protein: 6.3

## 2011-01-27 LAB — URINALYSIS, ROUTINE W REFLEX MICROSCOPIC
Bilirubin Urine: NEGATIVE
Glucose, UA: NEGATIVE
Hgb urine dipstick: NEGATIVE
Ketones, ur: NEGATIVE
Nitrite: NEGATIVE
Protein, ur: NEGATIVE
Specific Gravity, Urine: 1.017
Urobilinogen, UA: 0.2
pH: 7.5

## 2011-01-27 LAB — DIFFERENTIAL
Basophils Absolute: 0
Basophils Relative: 0
Eosinophils Absolute: 0.1
Eosinophils Relative: 1
Lymphocytes Relative: 26
Lymphs Abs: 1.5
Monocytes Absolute: 0.4
Monocytes Relative: 7
Neutro Abs: 3.9
Neutrophils Relative %: 65

## 2011-01-27 LAB — PROTIME-INR
INR: 0.9
Prothrombin Time: 12.5

## 2011-01-27 LAB — TSH: TSH: 1.912

## 2011-01-27 LAB — TROPONIN I
Troponin I: 0.01
Troponin I: 0.01
Troponin I: 0.02

## 2011-01-27 LAB — POCT CARDIAC MARKERS
CKMB, poc: <1 — ABNORMAL LOW
CKMB, poc: <1 — ABNORMAL LOW
Myoglobin, poc: 36.7
Myoglobin, poc: 42.5
Operator id: 272551
Operator id: 272551
Troponin i, poc: <0.05
Troponin i, poc: <0.05

## 2011-01-27 LAB — VALPROIC ACID LEVEL
Valproic Acid Lvl: 41 — ABNORMAL LOW
Valproic Acid Lvl: 70.3
Valproic Acid Lvl: 64.1

## 2011-01-27 LAB — D-DIMER, QUANTITATIVE (NOT AT ARMC): D-Dimer, Quant: 0.24

## 2011-01-27 LAB — ETHANOL: Alcohol, Ethyl (B): <5

## 2011-01-28 LAB — I-STAT 8, (EC8 V) (CONVERTED LAB)
Acid-base deficit: 5 — ABNORMAL HIGH
BUN: 9
Bicarbonate: 20.1
Chloride: 107
Glucose, Bld: 138 — ABNORMAL HIGH
HCT: 38
Hemoglobin: 12.9
Operator id: 284251
Potassium: 3.3 — ABNORMAL LOW
Sodium: 138
TCO2: 21
pCO2, Ven: 35 — ABNORMAL LOW
pH, Ven: 7.367 — ABNORMAL HIGH

## 2011-01-28 LAB — POCT I-STAT CREATININE
Creatinine, Ser: 0.3 — ABNORMAL LOW
Operator id: 284251

## 2011-01-28 LAB — D-DIMER, QUANTITATIVE (NOT AT ARMC): D-Dimer, Quant: 0.51 — ABNORMAL HIGH

## 2011-01-28 LAB — POCT CARDIAC MARKERS
CKMB, poc: <1 — ABNORMAL LOW
Myoglobin, poc: 21.3
Operator id: 284251
Troponin i, poc: <0.05

## 2011-01-29 LAB — DIFFERENTIAL
Basophils Absolute: 0
Basophils Absolute: 0
Basophils Absolute: 0
Basophils Relative: 0
Basophils Relative: 0
Basophils Relative: 0
Eosinophils Absolute: 0.1
Eosinophils Absolute: 0
Eosinophils Absolute: 0.1
Eosinophils Relative: 1
Eosinophils Relative: 1
Eosinophils Relative: 2
Lymphocytes Relative: 41
Lymphocytes Relative: 28
Lymphocytes Relative: 39
Lymphs Abs: 2.9
Lymphs Abs: 2.2
Lymphs Abs: 2.8
Monocytes Absolute: 0.4
Monocytes Absolute: 0.4
Monocytes Absolute: 0.4
Monocytes Relative: 6
Monocytes Relative: 5
Monocytes Relative: 5
Neutro Abs: 3.7
Neutro Abs: 3.9
Neutro Abs: 5.3
Neutrophils Relative %: 52
Neutrophils Relative %: 54
Neutrophils Relative %: 67

## 2011-01-29 LAB — BASIC METABOLIC PANEL
BUN: 4 — ABNORMAL LOW
BUN: 7
CO2: 25
CO2: 29
Calcium: 8.2 — ABNORMAL LOW
Calcium: 8.9
Chloride: 104
Chloride: 107
Creatinine, Ser: 0.54
Creatinine, Ser: 0.73
GFR calc Af Amer: >60
GFR calc Af Amer: >60
GFR calc non Af Amer: >60
GFR calc non Af Amer: >60
Glucose, Bld: 71
Glucose, Bld: 94
Potassium: 3.3 — ABNORMAL LOW
Potassium: 3.6
Sodium: 138
Sodium: 139

## 2011-01-29 LAB — COMPREHENSIVE METABOLIC PANEL
ALT: 12
ALT: 10
AST: 13
AST: 15
Albumin: 3.5
Albumin: 3.4 — ABNORMAL LOW
Alkaline Phosphatase: 66
Alkaline Phosphatase: 60
BUN: 6
BUN: 2 — ABNORMAL LOW
CO2: 24
CO2: 27
Calcium: 9.1
Calcium: 9.2
Chloride: 105
Chloride: 103
Creatinine, Ser: 0.57
Creatinine, Ser: 0.57
GFR calc Af Amer: >60
GFR calc Af Amer: >60
GFR calc non Af Amer: >60
GFR calc non Af Amer: >60
Glucose, Bld: 78
Glucose, Bld: 97
Potassium: 3.6
Potassium: 3.4 — ABNORMAL LOW
Sodium: 138
Sodium: 135
Total Bilirubin: 1.2
Total Bilirubin: 0.8
Total Protein: 7.2
Total Protein: 6.8

## 2011-01-29 LAB — CBC
HCT: 38.6
HCT: 34 — ABNORMAL LOW
HCT: 36.2
Hemoglobin: 13.3
Hemoglobin: 11.7 — ABNORMAL LOW
Hemoglobin: 12.5
MCHC: 34.6
MCHC: 34.5
MCHC: 34.6
MCV: 93.2
MCV: 93
MCV: 93.3
Platelets: 335
Platelets: 269
Platelets: 305
RBC: 4.14
RBC: 3.64 — ABNORMAL LOW
RBC: 3.9
RDW: 14.2 — ABNORMAL HIGH
RDW: 13.9
RDW: 13.9
WBC: 7.2
WBC: 7.2
WBC: 7.9

## 2011-01-29 LAB — I-STAT 8, (EC8 V) (CONVERTED LAB)
Acid-Base Excess: 1
BUN: 6
Bicarbonate: 26 — ABNORMAL HIGH
Chloride: 106
Glucose, Bld: 86
HCT: 38
Hemoglobin: 12.9
Operator id: 151321
Potassium: 3.9
Sodium: 139
TCO2: 27
pCO2, Ven: 40.4 — ABNORMAL LOW
pH, Ven: 7.417 — ABNORMAL HIGH

## 2011-01-29 LAB — URINALYSIS, ROUTINE W REFLEX MICROSCOPIC
Bilirubin Urine: NEGATIVE
Glucose, UA: NEGATIVE
Hgb urine dipstick: NEGATIVE
Ketones, ur: NEGATIVE
Nitrite: NEGATIVE
Protein, ur: NEGATIVE
Specific Gravity, Urine: 1.018
Urobilinogen, UA: 0.2
pH: 8

## 2011-01-29 LAB — POCT CARDIAC MARKERS
CKMB, poc: <1 — ABNORMAL LOW
CKMB, poc: <1 — ABNORMAL LOW
CKMB, poc: <1 — ABNORMAL LOW
Myoglobin, poc: 36.8
Myoglobin, poc: 17.9
Myoglobin, poc: 34.4
Operator id: 151321
Operator id: 1192
Operator id: 4531
Troponin i, poc: <0.05
Troponin i, poc: <0.05
Troponin i, poc: <0.05

## 2011-01-29 LAB — RAPID URINE DRUG SCREEN, HOSP PERFORMED
Amphetamines: NOT DETECTED
Barbiturates: NOT DETECTED
Benzodiazepines: NOT DETECTED
Cocaine: NOT DETECTED
Opiates: NOT DETECTED
Tetrahydrocannabinol: NOT DETECTED

## 2011-01-29 LAB — PHENYTOIN LEVEL, TOTAL: Phenytoin Lvl: <2.5 — ABNORMAL LOW

## 2011-01-29 LAB — POCT PREGNANCY, URINE
Operator id: 29727
Preg Test, Ur: NEGATIVE

## 2011-01-29 LAB — VALPROIC ACID LEVEL
Valproic Acid Lvl: 21.3 — ABNORMAL LOW
Valproic Acid Lvl: 44.4 — ABNORMAL LOW
Valproic Acid Lvl: 56

## 2011-01-29 LAB — TSH: TSH: 2.217

## 2011-01-29 LAB — URINE MICROSCOPIC-ADD ON

## 2011-01-29 LAB — POCT I-STAT CREATININE
Creatinine, Ser: 0.5
Operator id: 151321

## 2011-01-29 LAB — ETHANOL: Alcohol, Ethyl (B): <5

## 2011-02-01 LAB — I-STAT 8, (EC8 V) (CONVERTED LAB)
Acid-Base Excess: 2
BUN: 4 — ABNORMAL LOW
Bicarbonate: 22.9
Chloride: 107
Glucose, Bld: 90
HCT: 39
Hemoglobin: 13.3
Operator id: 288331
Potassium: 3.7
Sodium: 138
TCO2: 24
pCO2, Ven: 24.7 — ABNORMAL LOW
pH, Ven: 7.574 — ABNORMAL HIGH

## 2011-02-01 LAB — DIFFERENTIAL
Basophils Absolute: 0
Basophils Relative: 0
Eosinophils Absolute: 0.1
Eosinophils Relative: 1
Lymphocytes Relative: 33
Lymphs Abs: 2.3
Monocytes Absolute: 0.2
Monocytes Relative: 3
Neutro Abs: 4.4
Neutrophils Relative %: 63

## 2011-02-01 LAB — CBC
HCT: 37.1
Hemoglobin: 12.9
MCHC: 34.6
MCV: 92.1
Platelets: 248
RBC: 4.03
RDW: 14
WBC: 6.9

## 2011-02-01 LAB — POCT I-STAT CREATININE
Creatinine, Ser: 0.6
Operator id: 288331

## 2011-02-01 LAB — VALPROIC ACID LEVEL: Valproic Acid Lvl: 64.6

## 2011-02-02 LAB — TSH: TSH: 1.706

## 2011-02-02 LAB — URINALYSIS, ROUTINE W REFLEX MICROSCOPIC
Bilirubin Urine: NEGATIVE
Glucose, UA: NEGATIVE
Hgb urine dipstick: NEGATIVE
Ketones, ur: 15 — AB
Nitrite: NEGATIVE
Protein, ur: NEGATIVE
Specific Gravity, Urine: 1.016
Urobilinogen, UA: 0.2
pH: 6

## 2011-02-02 LAB — CBC
HCT: 32.4 — ABNORMAL LOW
HCT: 34.7 — ABNORMAL LOW
Hemoglobin: 11.2 — ABNORMAL LOW
Hemoglobin: 12.1
MCHC: 34.5
MCHC: 34.9
MCV: 91.8
MCV: 93.4
Platelets: 272
Platelets: 291
RBC: 3.47 — ABNORMAL LOW
RBC: 3.78 — ABNORMAL LOW
RDW: 14.2 — ABNORMAL HIGH
RDW: 14.4 — ABNORMAL HIGH
WBC: 6
WBC: 7.7

## 2011-02-02 LAB — COMPREHENSIVE METABOLIC PANEL
ALT: 13
AST: 17
Albumin: 3.1 — ABNORMAL LOW
Alkaline Phosphatase: 56
BUN: 5 — ABNORMAL LOW
CO2: 20
Calcium: 8.7
Chloride: 110
Creatinine, Ser: 0.67
GFR calc Af Amer: >60
GFR calc non Af Amer: >60
Glucose, Bld: 98
Potassium: 4
Sodium: 140
Total Bilirubin: 0.5
Total Protein: 6.8

## 2011-02-02 LAB — VALPROIC ACID LEVEL: Valproic Acid Lvl: 29 — ABNORMAL LOW

## 2011-02-02 LAB — BASIC METABOLIC PANEL
BUN: 5 — ABNORMAL LOW
CO2: 24
Calcium: 8.3 — ABNORMAL LOW
Chloride: 110
Creatinine, Ser: 0.56
GFR calc Af Amer: >60
GFR calc non Af Amer: >60
Glucose, Bld: 93
Potassium: 3.3 — ABNORMAL LOW
Sodium: 140

## 2011-02-02 LAB — DIFFERENTIAL
Basophils Absolute: 0
Basophils Relative: 0
Eosinophils Absolute: 0
Eosinophils Relative: 1
Lymphocytes Relative: 31
Lymphs Abs: 1.9
Monocytes Absolute: 0.3
Monocytes Relative: 6
Neutro Abs: 3.7
Neutrophils Relative %: 62

## 2011-02-10 IMAGING — CT CT HEAD W/O CM
1 of 2 series · 13 of 30 positions shown, 17 images · non-contrast
Comparison: 09/23/2009

CLINICAL DATA: Headache.  Dizziness and weakness.

CT HEAD WITHOUT CONTRAST
TECHNIQUE: Contiguous axial images were obtained from the base of
the skull through the vertex without contrast

[Series 2: brain · axial · 0.49mm/px · z∈[-123,-7]mm · 13 of 32 slices shown, 17 images]
[im 3/32  brain]
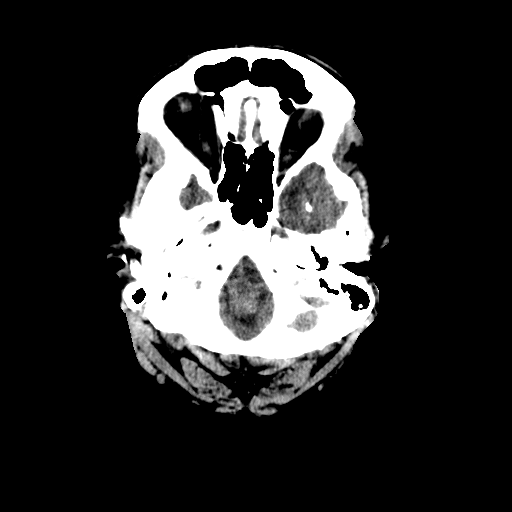
[im 3/32  bone]
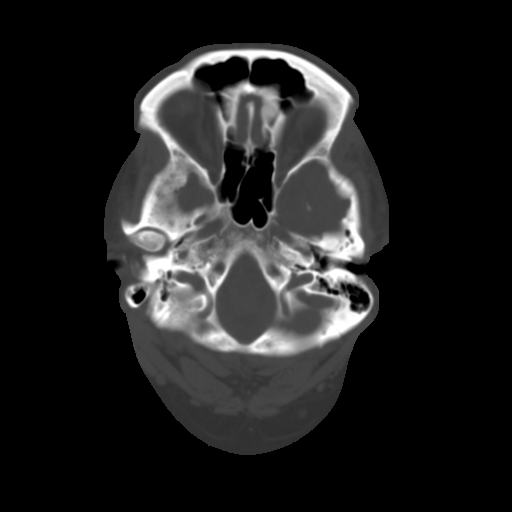
[im 5/32  brain]
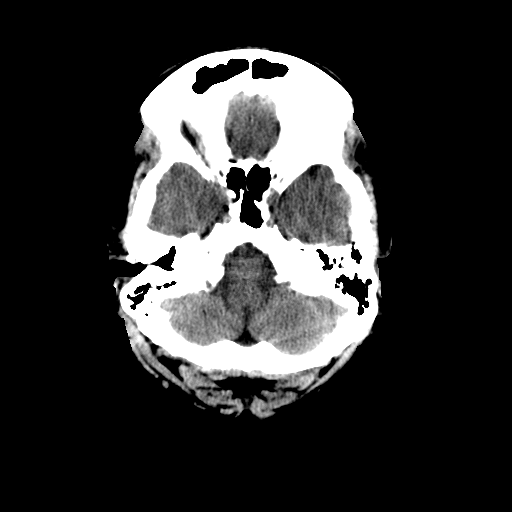
[im 7/32  brain]
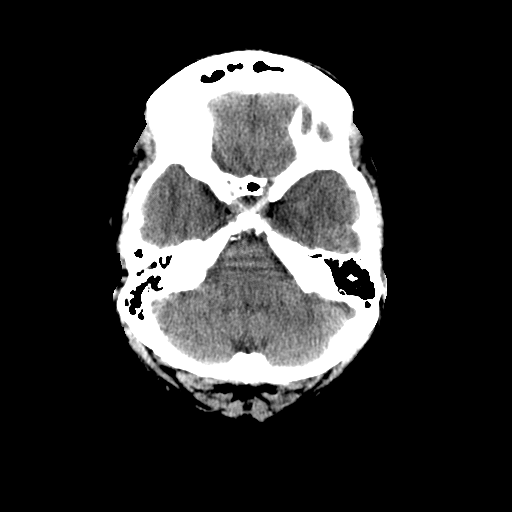
[im 9/32  brain]
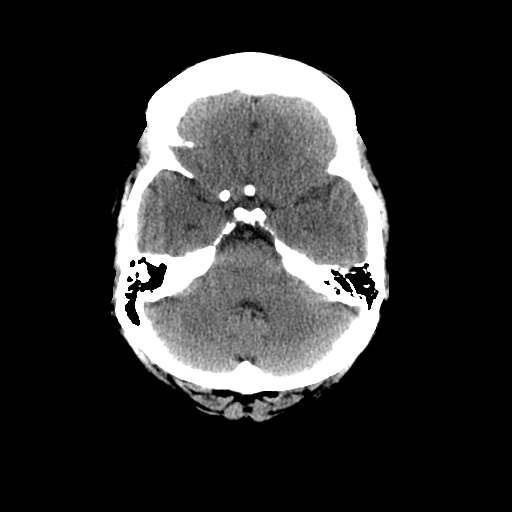
[im 12/32  brain]
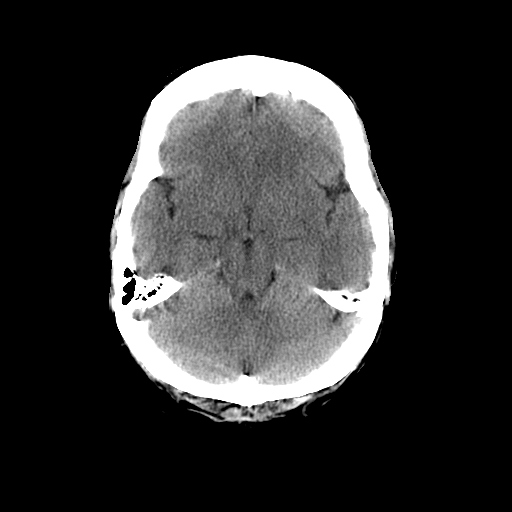
[im 12/32  bone]
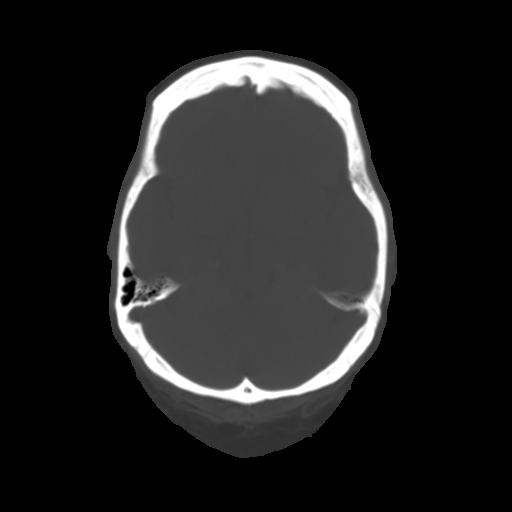
[im 14/32  brain]
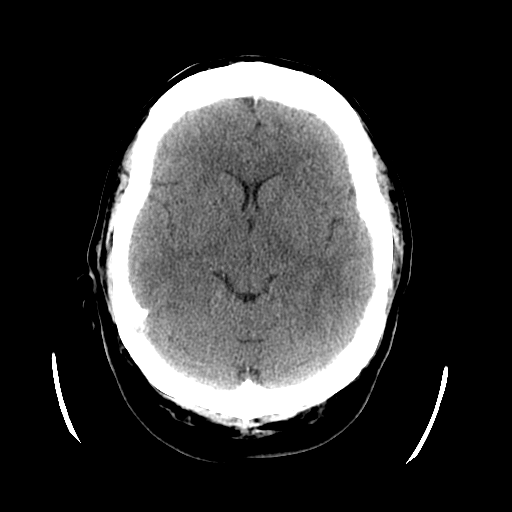
[im 16/32  brain]
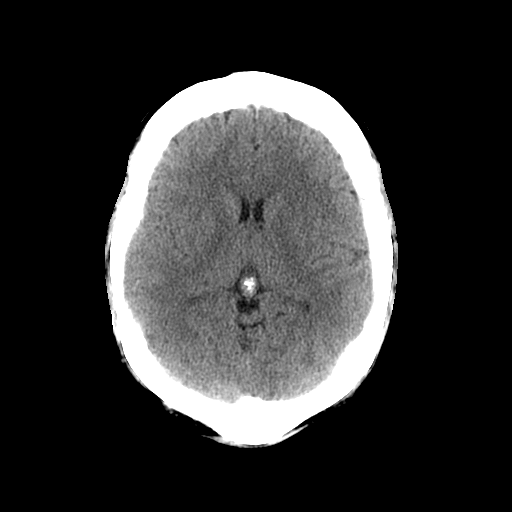
[im 18/32  brain]
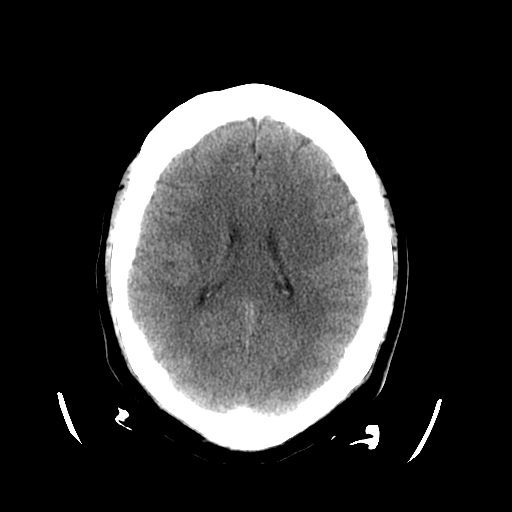
[im 20/32  brain]
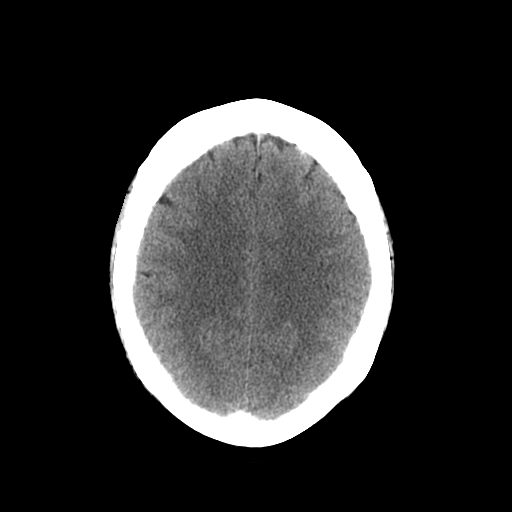
[im 20/32  bone]
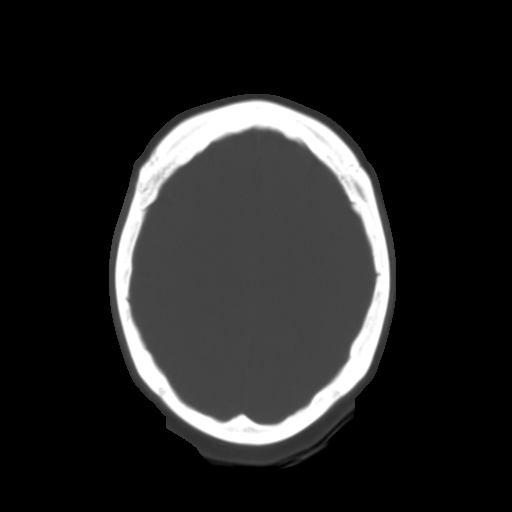
[im 23/32  brain]
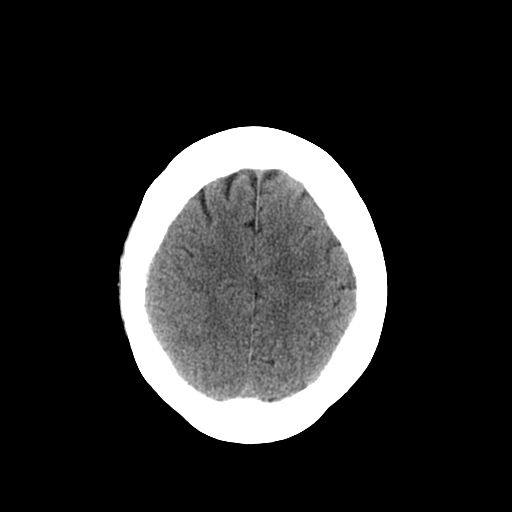
[im 25/32  brain]
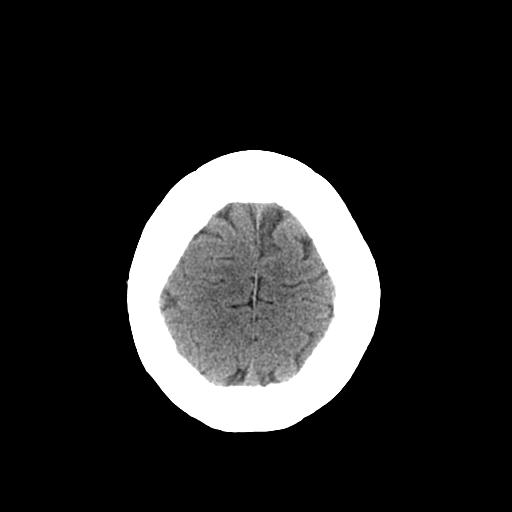
[im 27/32  brain]
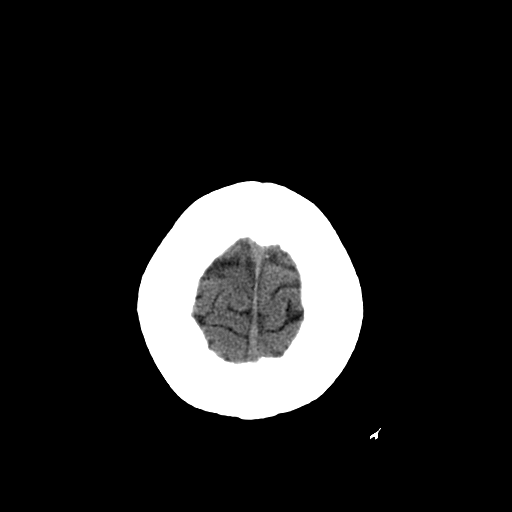
[im 29/32  brain]
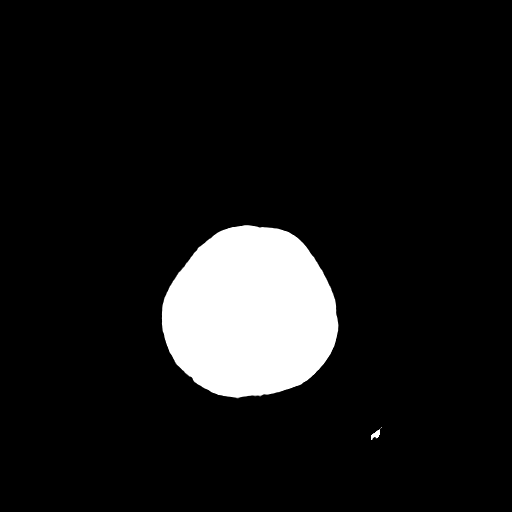
[im 29/32  bone]
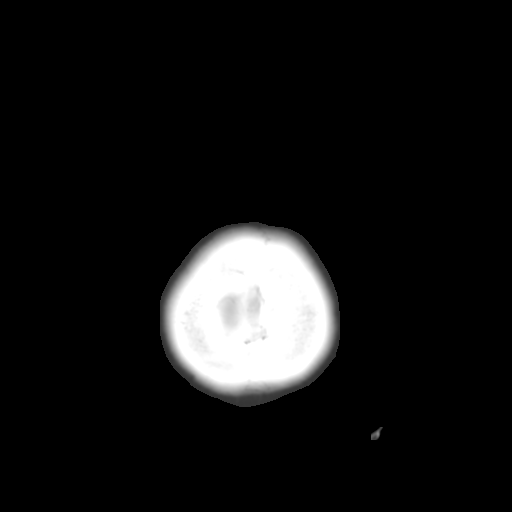

[13 of 30 positions shown; findings below may reference images not displayed]

FINDINGS: The brain has a normal appearance without evidence for
hemorrhage, acute infarction, hydrocephalus, or mass lesion.  There
is no extra axial fluid collection.  The skull and paranasal
sinuses are normal.
IMPRESSION: Normal CT of the head without contrast.

## 2011-02-10 IMAGING — RF DG FLUORO GUIDE NDL PLC/BX
2 series · 3 of 3 positions shown · non-contrast
Comparison: none

CLINICAL DATA: Severe headache.  Sudden onset.

DIAGNOSTIC LUMBAR PUNCTURE UNDER FLUOROSCOPIC GUIDANCE
Fluoroscopy time:  0.4 minutes.
TECHNIQUE: Informed consent was obtained from the patient prior to
the procedure, including potential complications of headache,
allergy, and pain.   With the patient prone, the lower back was
prepped with Betadine.  1% Lidocaine was used for local anesthesia.
Lumbar puncture was performed at the left L2-3 level using a
inches 20 gauge needle with return of clear CSF with an opening
pressure of 19 cm water.   Nine ml of CSF were obtained for
laboratory studies.  The patient tolerated the procedure well and
there were no apparent complications.

[Series 1: run · 2 of 2 slices shown (1 of 2)]
[im 1/2]
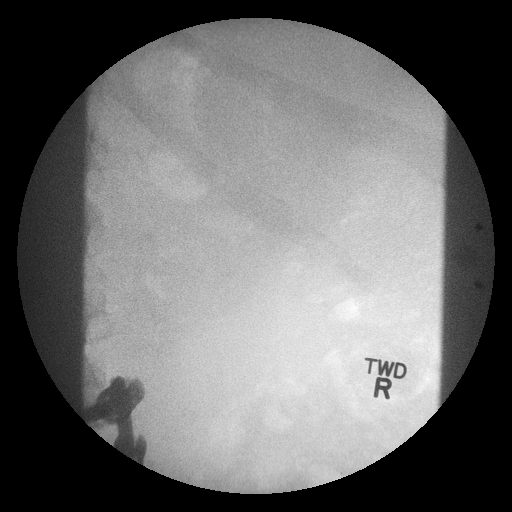
[im 2/2]
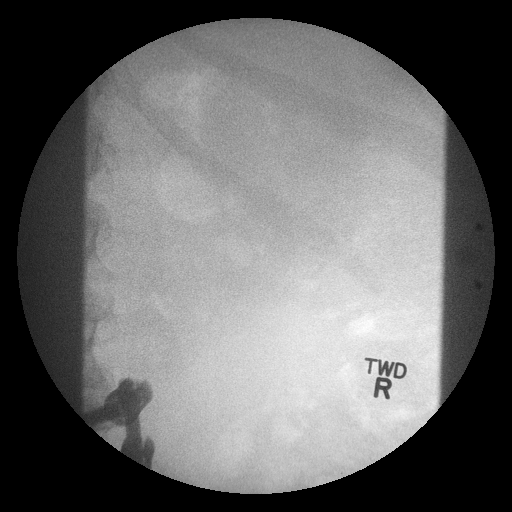

[Series 2: run · 1 of 1 slices shown (2 of 2)]
[im 1/1]
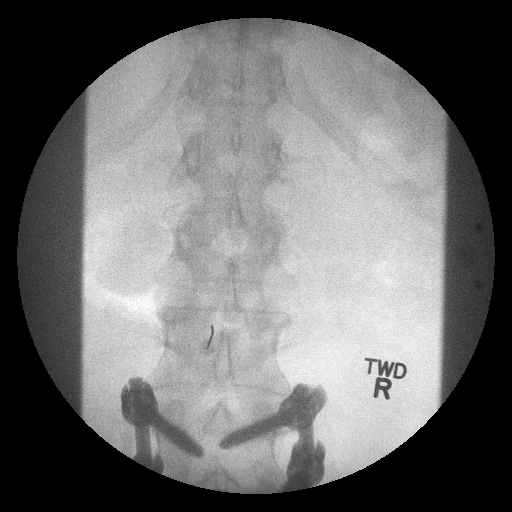

[3 of 3 positions shown; findings below may reference images not displayed]

IMPRESSION: Technically successful lumbar puncture of left L2-3.  CSF grossly
clear.

## 2011-02-24 DIAGNOSIS — Z8669 Personal history of other diseases of the nervous system and sense organs: Secondary | ICD-10-CM | POA: Insufficient documentation

## 2011-02-24 DIAGNOSIS — I10 Essential (primary) hypertension: Secondary | ICD-10-CM | POA: Insufficient documentation

## 2011-02-24 DIAGNOSIS — J45909 Unspecified asthma, uncomplicated: Secondary | ICD-10-CM | POA: Insufficient documentation

## 2011-02-24 DIAGNOSIS — G8929 Other chronic pain: Secondary | ICD-10-CM | POA: Insufficient documentation

## 2011-03-09 ENCOUNTER — Encounter (HOSPITAL_COMMUNITY): Payer: Self-pay | Admitting: Emergency Medicine

## 2011-03-09 ENCOUNTER — Emergency Department (INDEPENDENT_AMBULATORY_CARE_PROVIDER_SITE_OTHER)
Admission: EM | Admit: 2011-03-09 | Discharge: 2011-03-09 | Disposition: A | Discharge status: Home / Self Care | Payer: Medicare Other | Source: Home / Self Care

## 2011-03-09 DIAGNOSIS — K529 Noninfective gastroenteritis and colitis, unspecified: Secondary | ICD-10-CM

## 2011-03-09 DIAGNOSIS — K5289 Other specified noninfective gastroenteritis and colitis: Secondary | ICD-10-CM

## 2011-03-09 HISTORY — DX: Bipolar disorder, unspecified: F31.9

## 2011-03-09 HISTORY — DX: Sleep apnea, unspecified: G47.30

## 2011-03-09 HISTORY — DX: Anxiety disorder, unspecified: F41.9

## 2011-03-09 HISTORY — DX: Essential (primary) hypertension: I10

## 2011-03-09 HISTORY — DX: Major depressive disorder, single episode, unspecified: F32.9

## 2011-03-09 HISTORY — DX: Depression, unspecified: F32.A

## 2011-03-09 MED ORDER — ONDANSETRON 8 MG PO TBDP: 8.0000 mg | ORAL_TABLET | Freq: Three times a day (TID) | ORAL | Status: AC | PRN

## 2011-03-09 MED ORDER — ONDANSETRON 4 MG PO TBDP
ORAL_TABLET | ORAL | Status: AC
  Filled 2011-03-09: qty 2

## 2011-03-09 MED ORDER — ONDANSETRON 4 MG PO TBDP
8.0000 mg | ORAL_TABLET | Freq: Once | ORAL | Status: AC
  Administered 2011-03-09: 8 mg via ORAL

## 2011-03-09 NOTE — ED Notes (Signed)
Received a call from front office staff stating that pt was going to pass out.  Bronda Alfred and myself went out to lobby immediately with a wheelchair to evaluate pt and she was lying in the floor as you walk into building.  Pt's eyes fluttering entire time.  We assisted pt to wheelchair, brought her back to exam room 2 to assess her vitals.  Pt's vitals stable.  Per RN pt to be placed in wheelchair and wait her turn in lobby.

## 2011-03-09 NOTE — ED Notes (Signed)
Patient tolerated sprite.  No vomiting Lapan-Hutchens, Ranae Plumber 03/09/2011

## 2011-03-09 NOTE — ED Provider Notes (Signed)
Medical screening examination/treatment/procedure(s) were performed by non-physician practitioner and as supervising physician I was immediately available for consultation/collaboration.  Luiz Blare MD   Luiz Blare, MD 03/09/11 2223

## 2011-03-09 NOTE — ED Provider Notes (Signed)
History     CSN: [id number] Arrival date & time: 03/09/2011 12:44 PM   None     Chief Complaint  Patient presents with  . Emesis    (Consider location/radiation/quality/duration/timing/severity/associated sxs/prior treatment) HPI Comments: Onset of N/V/D 2 days ago. Diarrhea is watery, no blood, 3-5 stools per day. Vomits anytime she eats or drinks anything. Has only had sips of water today to take her medications. Is not drinking today due to nausea and fear of vomiting. HA today, and also has become lightheaded today with change of positions. No abd pain or fever. No recent abx, travel or sick contacts.   The history is provided by the patient.    Past Medical History  Diagnosis Date  . Hypertension   . Asthma   . Sleep apnea   . Anxiety   . Depression   . Bipolar 1 disorder     Past Surgical History  Procedure Date  . Abdominal hysterectomy     No family history on file.  History  Substance Use Topics  . Smoking status: Never Smoker   . Smokeless tobacco: Not on file  . Alcohol Use: No    OB History    Grav Para Term Preterm Abortions TAB SAB Ect Mult Living                  Review of Systems  Constitutional: Negative for fever and chills.  HENT: Negative for ear pain, congestion and sore throat.   Respiratory: Negative for cough.   Cardiovascular: Negative for chest pain.  Gastrointestinal: Positive for nausea, vomiting and diarrhea. Negative for abdominal pain and blood in stool.  Genitourinary: Negative for dysuria, urgency and frequency.  Neurological: Positive for light-headedness and headaches.    Allergies  Ciprofloxacin; Codeine; Morphine and related; Penicillins; Tetracyclines & related; and Tramadol  Home Medications   Current Outpatient Rx  Name Route Sig Dispense Refill  . ALBUTEROL SULFATE HFA IN Inhalation Inhale 1 puff into the lungs 4 (four) times daily.      Marland Kitchen BENZTROPINE MESYLATE 1 MG PO TABS Oral Take 1 mg by mouth 2 (two)  times daily.      Marland Kitchen CITALOPRAM HYDROBROMIDE 40 MG PO TABS Oral Take 40 mg by mouth daily.      Marland Kitchen GABAPENTIN 600 MG PO TABS Oral Take 600 mg by mouth 3 (three) times daily.      Marland Kitchen GLYCOPYRROLATE 2 MG PO TABS Oral Take 2 mg by mouth 3 (three) times daily.      Marland Kitchen HALOPERIDOL 5 MG PO TABS Oral Take 5 mg by mouth 2 (two) times daily.      Marland Kitchen HYDROXYZINE HCL 25 MG PO TABS Oral Take 25 mg by mouth at bedtime.      Marland Kitchen LISINOPRIL 5 MG PO TABS Oral Take 5 mg by mouth daily.      . LUBIPROSTONE 24 MCG PO CAPS Oral Take 24 mcg by mouth 2 (two) times daily with a meal.      . METHOCARBAMOL 500 MG PO TABS Oral Take 500 mg by mouth 3 (three) times daily.      Marland Kitchen PANTOPRAZOLE SODIUM 40 MG PO TBEC Oral Take 40 mg by mouth daily.      Marland Kitchen ONDANSETRON 8 MG PO TBDP Oral Take 1 tablet (8 mg total) by mouth every 8 (eight) hours as needed for nausea. 9 tablet 0  . SUMATRIPTAN SUCCINATE 100 MG PO TABS Oral Take 100 mg by mouth every 2 (two)  hours as needed.        BP 135/88  Pulse 90  Temp(Src) 98.7 F (37.1 C) (Oral)  Resp 16  SpO2 97%  Physical Exam  Nursing note and vitals reviewed. Constitutional: She appears well-developed and well-nourished. No distress.  HENT:  Head: Normocephalic and atraumatic.  Right Ear: Tympanic membrane, external ear and ear canal normal.  Left Ear: Tympanic membrane, external ear and ear canal normal.  Nose: Nose normal.  Mouth/Throat: Uvula is midline, oropharynx is clear and moist and mucous membranes are normal. No oropharyngeal exudate, posterior oropharyngeal edema or posterior oropharyngeal erythema.  Neck: Neck supple.  Cardiovascular: Normal rate, regular rhythm and normal heart sounds.   Pulmonary/Chest: Effort normal and breath sounds normal. No respiratory distress.  Abdominal: Soft. Bowel sounds are normal. She exhibits no mass. There is no hepatosplenomegaly. There is no tenderness. There is no CVA tenderness.  Lymphadenopathy:    She has no cervical adenopathy.    Neurological: She is alert.  Skin: Skin is warm and dry.  Psychiatric: She has a normal mood and affect.    ED Course  Procedures (including critical care time)  Labs Reviewed - No data to display No results found.   1. Gastroenteritis       MDM  Orthostatic VS neg. Pt reports symptomatic improvement of nausea after Zofran 8 mg po. She drank 8 oz of Sprite w/o vomiting also.         Melody Comas, [place] 03/09/11 1521

## 2011-03-09 NOTE — ED Notes (Signed)
C/o vomiting and diarrhea for 3 days.  Now has a headache.  Reports no vomiting today.  Reports 3 diarrhea stools today.  Has had sips of water with daily meds today

## 2011-04-21 IMAGING — CR DG LUMBAR SPINE COMPLETE 4+V
5 series · 5 of 5 positions shown · non-contrast
Comparison: 11/04/2006

CLINICAL DATA: Fall 2 weeks ago with lower back pain.  Previous
lumbar spine surgery.

LUMBAR SPINE - COMPLETE 4+ VIEW

[t l-spine a.p.]
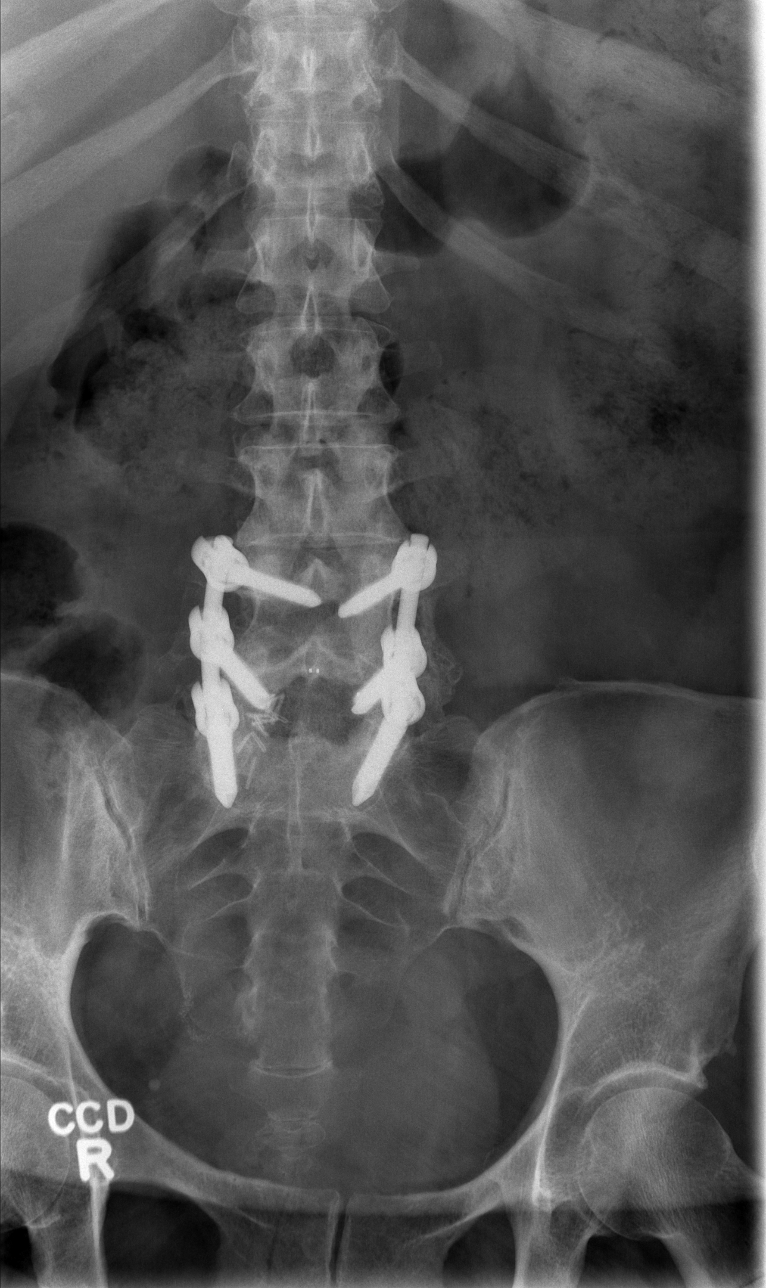

[t l-spine oblique exposure (1 of 2)]
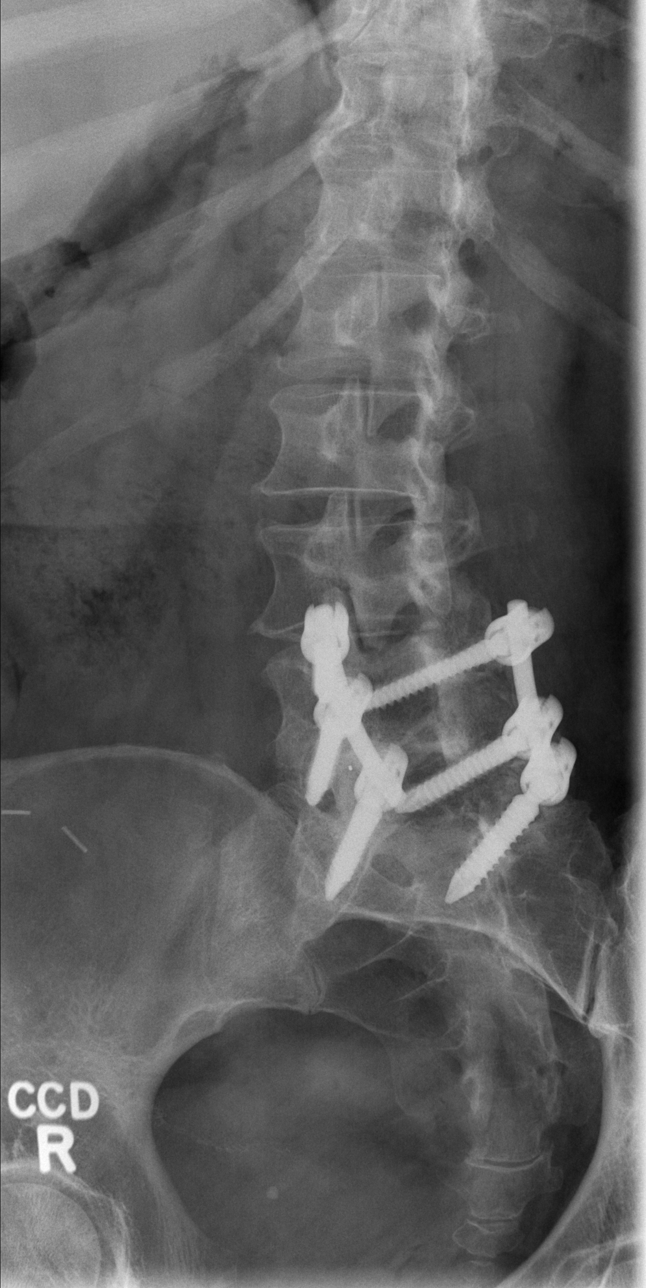

[t l-spine oblique exposure (2 of 2)]
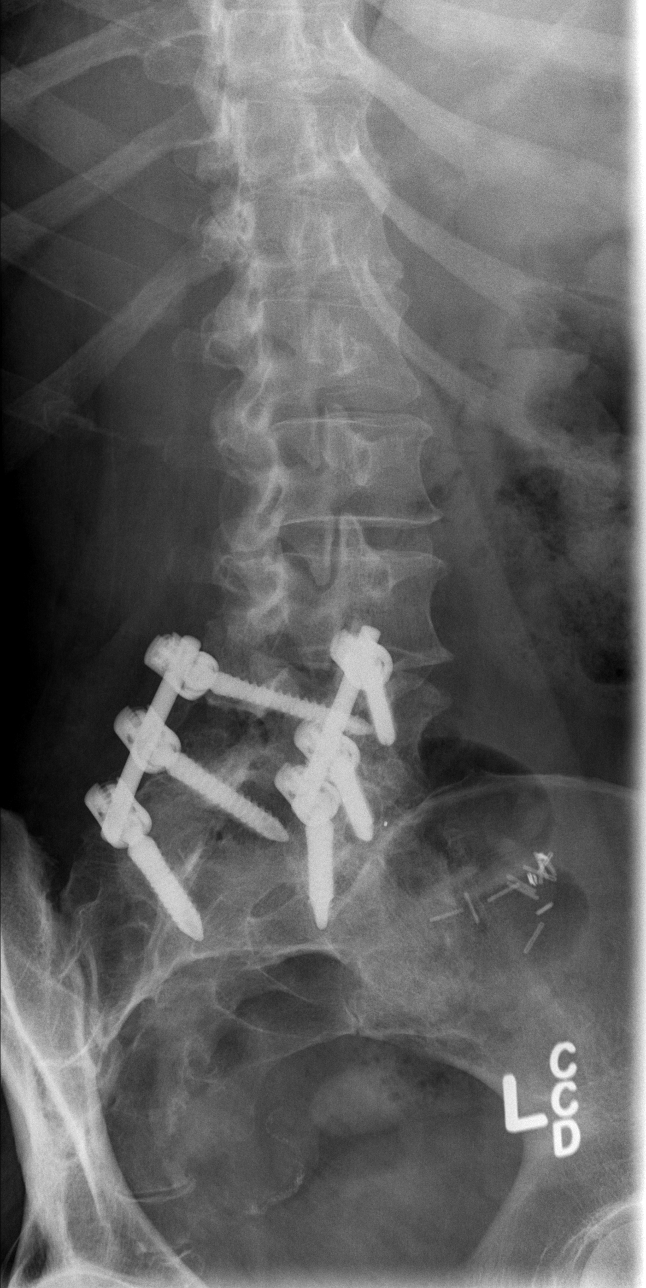

[t l-spine lat]
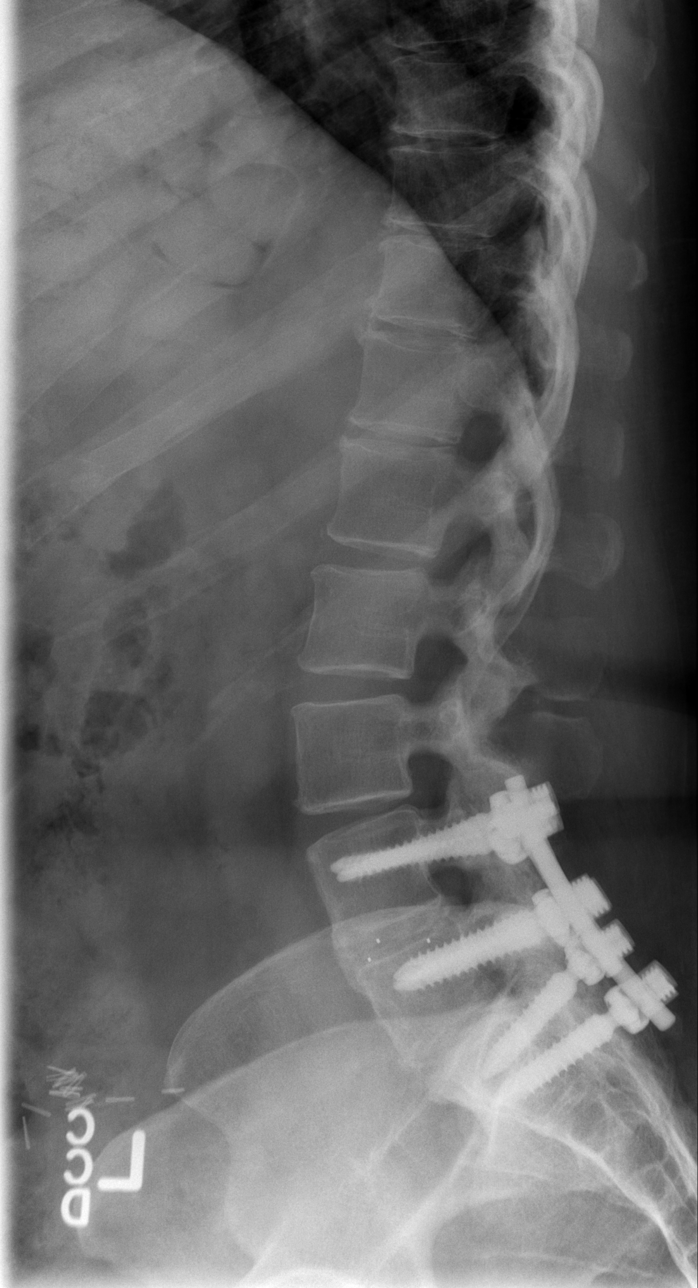

[t l-spine l5-s1 spot]
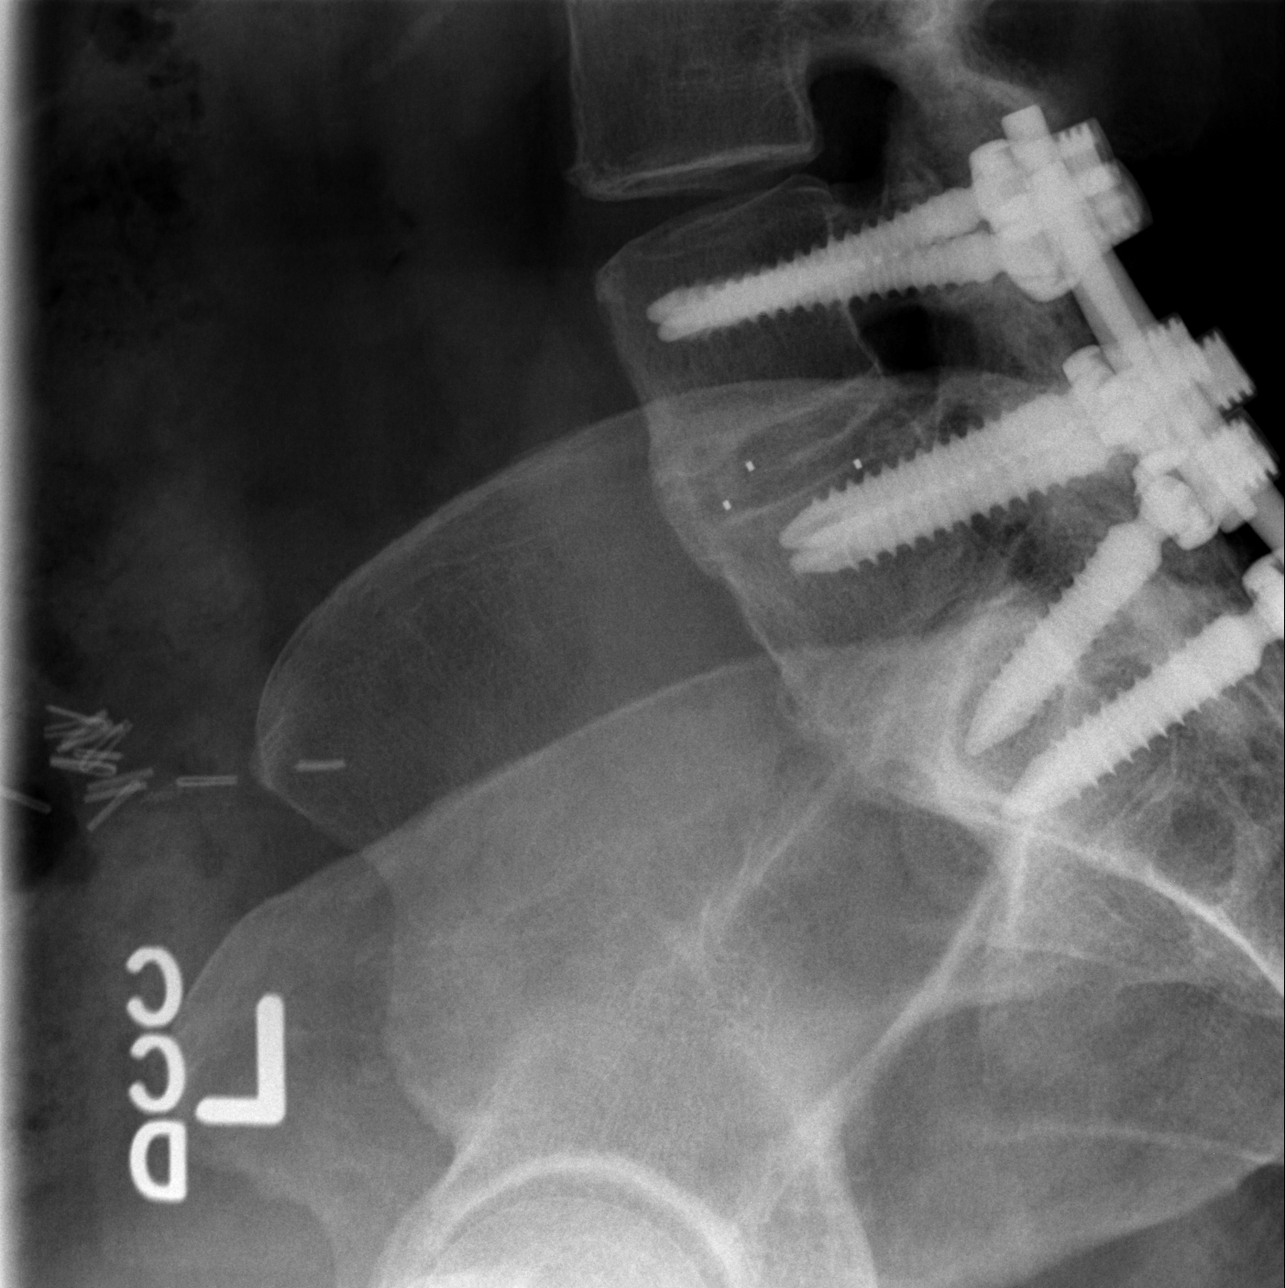

[5 of 5 positions shown; findings below may reference images not displayed]

FINDINGS: AP, lateral and oblique images of the lumbar spine were
obtained.  There is pedicle screw fixation of L4-S1.  Increased
fusion at L4-L5 and L5-S1 since 5224.  Interbody device at L4-L5.
Alignment of the lumbar spine is stable.  The vertebral body
heights are maintained.
IMPRESSION: Expected postoperative changes in the lower lumbar spine.  No acute
findings.  No evidence of hardware complication.

## 2011-05-17 ENCOUNTER — Encounter (HOSPITAL_COMMUNITY): Payer: Self-pay | Admitting: *Deleted

## 2011-05-17 ENCOUNTER — Emergency Department (HOSPITAL_COMMUNITY)
Admission: EM | Admit: 2011-05-17 | Discharge: 2011-05-18 | Disposition: A | Discharge status: Other Acute Inpatient Hospital | Payer: Medicare Other | Source: Home / Self Care | Attending: Emergency Medicine | Admitting: Emergency Medicine

## 2011-05-17 DIAGNOSIS — F411 Generalized anxiety disorder: Secondary | ICD-10-CM | POA: Insufficient documentation

## 2011-05-17 DIAGNOSIS — R45851 Suicidal ideations: Secondary | ICD-10-CM | POA: Insufficient documentation

## 2011-05-17 DIAGNOSIS — F29 Unspecified psychosis not due to a substance or known physiological condition: Secondary | ICD-10-CM | POA: Insufficient documentation

## 2011-05-17 DIAGNOSIS — Z79899 Other long term (current) drug therapy: Secondary | ICD-10-CM | POA: Insufficient documentation

## 2011-05-17 DIAGNOSIS — I10 Essential (primary) hypertension: Secondary | ICD-10-CM | POA: Insufficient documentation

## 2011-05-17 LAB — RAPID URINE DRUG SCREEN, HOSP PERFORMED
Amphetamines: NOT DETECTED
Barbiturates: NOT DETECTED
Benzodiazepines: NOT DETECTED
Cocaine: NOT DETECTED
Opiates: NOT DETECTED
Tetrahydrocannabinol: NOT DETECTED

## 2011-05-17 LAB — COMPREHENSIVE METABOLIC PANEL
ALT: 19 U/L (ref 0–35)
AST: 33 U/L (ref 0–37)
Albumin: 3.8 g/dL (ref 3.5–5.2)
Alkaline Phosphatase: 89 U/L (ref 39–117)
BUN: 7 mg/dL (ref 6–23)
CO2: 19 mEq/L (ref 19–32)
Calcium: 9.5 mg/dL (ref 8.4–10.5)
Chloride: 102 mEq/L (ref 96–112)
Creatinine, Ser: 0.43 mg/dL — ABNORMAL LOW (ref 0.50–1.10)
GFR calc Af Amer: >90 mL/min (ref >90)
GFR calc non Af Amer: >90 mL/min (ref >90)
Glucose, Bld: 83 mg/dL (ref 70–99)
Potassium: 4.7 mEq/L (ref 3.5–5.1)
Sodium: 135 mEq/L (ref 135–145)
Total Bilirubin: 0.4 mg/dL (ref 0.3–1.2)
Total Protein: 7.9 g/dL (ref 6.0–8.3)

## 2011-05-17 LAB — DIFFERENTIAL
Basophils Absolute: 0 10*3/uL (ref 0.0–0.1)
Basophils Relative: 0 % (ref 0–1)
Eosinophils Absolute: 0 10*3/uL (ref 0.0–0.7)
Eosinophils Relative: 1 % (ref 0–5)
Lymphocytes Relative: 28 % (ref 12–46)
Lymphs Abs: 2.5 10*3/uL (ref 0.7–4.0)
Monocytes Absolute: 0.3 10*3/uL (ref 0.1–1.0)
Monocytes Relative: 4 % (ref 3–12)
Neutro Abs: 6 10*3/uL (ref 1.7–7.7)
Neutrophils Relative %: 68 % (ref 43–77)

## 2011-05-17 LAB — CBC
HCT: 37.7 % (ref 36.0–46.0)
Hemoglobin: 13 g/dL (ref 12.0–15.0)
MCH: 30.7 pg (ref 26.0–34.0)
MCHC: 34.5 g/dL (ref 30.0–36.0)
MCV: 89.1 fL (ref 78.0–100.0)
Platelets: 311 10*3/uL (ref 150–400)
RBC: 4.23 MIL/uL (ref 3.87–5.11)
RDW: 13.3 % (ref 11.5–15.5)
WBC: 8.8 10*3/uL (ref 4.0–10.5)

## 2011-05-17 LAB — ETHANOL: Alcohol, Ethyl (B): <11 mg/dL (ref 0–11)

## 2011-05-17 MED ORDER — LORAZEPAM 1 MG PO TABS: 1.0000 mg | ORAL_TABLET | Freq: Three times a day (TID) | ORAL | Status: DC | PRN

## 2011-05-17 MED ORDER — IBUPROFEN 600 MG PO TABS: 600.0000 mg | ORAL_TABLET | Freq: Three times a day (TID) | ORAL | Status: DC | PRN

## 2011-05-17 MED ORDER — ONDANSETRON HCL 4 MG PO TABS: 4.0000 mg | ORAL_TABLET | Freq: Three times a day (TID) | ORAL | Status: DC | PRN

## 2011-05-17 NOTE — BH Assessment (Signed)
Assessment Note   [name] [name] is a 46 y.o. female who presents to [facility] with SI and plan to OD on pills.  Pt says has been feeling this way for a long time--" everyday I dream of killing myself."   Pt reports stressors: issues with children(children have SA problems and currently living with drug dealers).  Pt told psychiatrist that she wanted to die.  Pt admits to visual hallucinations, says she sees dogs and bugs in her home; says she has visual halluc occasionally.  Pt has multi admits with [facility], [facility] and [facility], last inpt admission was 10/2010 with [facility].  Pt denies HI and SA at this time.  Info sent to [facility] to review for inpt admission.     Axis I: Bipolar, Depressed Axis II: Deferred Axis III:  Past Medical History  Diagnosis Date  . Hypertension   . Asthma   . Sleep apnea   . Anxiety   . Depression   . Bipolar 1 disorder    Axis IV: other psychosocial or environmental problems, problems related to social environment and problems with primary support group Axis V: 31-40 impairment in reality testing  Past Medical History:  Past Medical History  Diagnosis Date  . Hypertension   . Asthma   . Sleep apnea   . Anxiety   . Depression   . Bipolar 1 disorder     Past Surgical History  Procedure Date  . Abdominal hysterectomy     Family History: No family history on file.  Social History:  reports that she has never smoked. She does not have any smokeless tobacco history on file. She reports that she does not drink alcohol or use illicit drugs.  Additional Social History:  Alcohol / Drug Use Pain Medications: None  Prescriptions: None  Over the Counter: None  History of alcohol / drug use?: Yes Substance #1 Name of Substance 1: Alcohol  1 - Age of First Use: Teens  1 - Amount (size/oz): Varies  1 - Frequency: Varies  1 - Duration: On-going  1 - Last Use / Amount: Unk  Substance #2 Name of Substance 2: Cocaine  2 - Age of First Use: Unk  2 - Amount (size/oz): Varies    2 - Frequency: Varies  2 - Duration: Unk  2 - Last Use / Amount: Unk Allergies:  Allergies  Allergen Reactions  . Codeine Nausea And Vomiting  . Morphine And Related Other (See Comments)    Memory loss  . Tetracyclines & Related Hives  . Tramadol Other (See Comments)    Memory loss  . Ciprofloxacin Rash  . Penicillins Hives and Rash    Home Medications:  Medications Prior to Admission  Medication Dose Route Frequency Provider Last Rate Last Dose  . ibuprofen (ADVIL,MOTRIN) tablet 600 mg  600 mg Oral Q8H PRN Glynn Octave, MD      . LORazepam (ATIVAN) tablet 1 mg  1 mg Oral Q8H PRN Glynn Octave, MD      . ondansetron [facility]) tablet 4 mg  4 mg Oral Q8H PRN Glynn Octave, MD       Medications Prior to Admission  Medication Sig Dispense Refill  . ALBUTEROL SULFATE HFA IN Inhale 1 puff into the lungs 4 (four) times daily.        . benztropine (COGENTIN) 1 MG tablet Take 1 mg by mouth 2 (two) times daily.        . citalopram (CELEXA) 40 MG tablet Take 40 mg by  mouth daily.        Marland Kitchen gabapentin (NEURONTIN) 600 MG tablet Take 600 mg by mouth 3 (three) times daily.        Marland Kitchen glycopyrrolate (ROBINUL) 2 MG tablet Take 2 mg by mouth 3 (three) times daily.        . haloperidol (HALDOL) 5 MG tablet Take 5 mg by mouth 2 (two) times daily.        . hydrOXYzine (ATARAX/VISTARIL) 25 MG tablet Take 25 mg by mouth at bedtime.        . pantoprazole (PROTONIX) 40 MG tablet Take 40 mg by mouth daily.        . SUMAtriptan (IMITREX) 100 MG tablet Take 100 mg by mouth every 2 (two) hours as needed. migranes      . lisinopril (PRINIVIL,ZESTRIL) 5 MG tablet Take 5 mg by mouth daily.        Marland Kitchen lubiprostone (AMITIZA) 24 MCG capsule Take 24 mcg by mouth 2 (two) times daily with a meal.        . methocarbamol (ROBAXIN) 500 MG tablet Take 500 mg by mouth 3 (three) times daily.          OB/GYN Status:  No LMP recorded. Patient has had a hysterectomy.  General Assessment Data Location of Assessment: [facility]  ED Living Arrangements: Alone Can pt return to current living arrangement?: Yes Admission Status: Voluntary Is patient capable of signing voluntary admission?: Yes Transfer from: Acute Hospital Referral Source: MD  Education Status Is patient currently in school?: No Current Grade: None  Highest grade of school patient has completed: None  Name of school: None  Contact person: None   Risk to self Suicidal Ideation: Yes-Currently Present Suicidal Intent: Yes-Currently Present Is patient at risk for suicide?: Yes Suicidal Plan?: Yes-Currently Present Specify Current Suicidal Plan: OD on Pills  Access to Means: Yes Specify Access to Suicidal Means: Medications  What has been your use of drugs/alcohol within the last 12 months?: Pt denies current SA use  Previous Attempts/Gestures: Yes How many times?: 5  Other Self Harm Risks: None  Triggers for Past Attempts: Family contact Intentional Self Injurious Behavior: None Family Suicide History: No Recent stressful life event(s): Other (Comment) (Family issues ) Persecutory voices/beliefs?: No Depression: Yes Depression Symptoms: Isolating;Fatigue;Loss of interest in usual pleasures;Feeling worthless/self pity;Tearfulness Substance abuse history and/or treatment for substance abuse?: No Suicide prevention information given to non-admitted patients: Not applicable  Risk to Others Homicidal Ideation: No Thoughts of Harm to Others: No Current Homicidal Intent: No Current Homicidal Plan: No Access to Homicidal Means: No Identified Victim: None  History of harm to others?: No Assessment of Violence: None Noted Violent Behavior Description: None  Does patient have access to weapons?: No Criminal Charges Pending?: No Does patient have a court date: No  Psychosis Hallucinations: Visual (Says occasionally sees dogs or bugs in home. ) Delusions: None noted  Mental Status Report Appear/Hygiene: Disheveled Eye Contact: Fair Motor  Activity: Unremarkable Speech: Logical/coherent Level of Consciousness: Alert Mood: Depressed;Sad;Anhedonia Affect: Depressed;Sad Anxiety Level: None Thought Processes: Coherent;Relevant Judgement: Impaired Orientation: Person;Place;Time;Situation Obsessive Compulsive Thoughts/Behaviors: None  Cognitive Functioning Concentration: Normal Memory: Recent Intact;Remote Intact IQ: Average Insight: Poor Impulse Control: Poor Appetite: Fair Weight Loss: 0  Weight Gain: 0  Sleep: Decreased Total Hours of Sleep: 5  Vegetative Symptoms: None  Prior Inpatient Therapy Prior Inpatient Therapy: Yes Prior Therapy Dates: 2012 Prior Therapy Facilty/Provider(s): [facility] Reason for Treatment: SI/Depression   Prior Outpatient Therapy Prior Outpatient Therapy: Yes  Prior Therapy Dates: Current  Prior Therapy Facilty/Provider(s): Psych-Dr Ilean Skill; Therapy-Dr. Huntley Estelle  Reason for Treatment: Med Mgt; Therapy   ADL Screening (condition at time of admission) Patient's cognitive ability adequate to safely complete daily activities?: Yes Patient able to express need for assistance with ADLs?: Yes Independently performs ADLs?: Yes Weakness of Legs: None Weakness of Arms/Hands: None       Abuse/Neglect Assessment (Assessment to be complete while patient is alone) Physical Abuse: Denies Verbal Abuse: Denies Sexual Abuse: Denies Exploitation of patient/patient's resources: Denies Self-Neglect: Denies Values / Beliefs Cultural Requests During Hospitalization: None Spiritual Requests During Hospitalization: None Consults Spiritual Care Consult Needed: No Social Work Consult Needed: No Merchant navy officer (For Healthcare) Advance Directive: Patient does not have advance directive;Patient would not like information Pre-existing out of facility DNR order (yellow form or pink MOST form): No    Additional Information 1:1 In Past 12 Months?: No CIRT Risk: No Elopement Risk: No Does patient have  medical clearance?: Yes     Disposition:  Disposition Disposition of Patient: Inpatient treatment program;Referred to [facility] ) Type of inpatient treatment program: Adult Patient referred to: Other (Comment) [facility])  On Site Evaluation by:   Reviewed with Physician:     Murrell Redden 05/17/2011 10:38 PM

## 2011-05-17 NOTE — ED Notes (Signed)
Pt sleeping soundly and does not wish to take phone call from son at present time. Son very agitated on the phone, cursing at nurse that it is urgent that he speak to her. Son does not wish to leave a message with the nurse, just states "Tell her that her son loves her and tell her to call me." Writer asked again if a message could be taken, son denied, using profanity and yelling.

## 2011-05-17 NOTE — ED Notes (Signed)
Pt states she has been depressed with thoughts of killing herself by overdose.  Denies HI.  Would not specify length of depression or suicidal thoughts.  Has had previous overdose attempts.  Was treated at [facility] last year for same.

## 2011-05-17 NOTE — ED Notes (Signed)
2 bags belongings in locker 41

## 2011-05-17 NOTE — ED Notes (Signed)
Pt with 2 belongings bags

## 2011-05-17 NOTE — ED Provider Notes (Signed)
History     CSN: [id number]  Arrival date & time 05/17/11  1613   First MD Initiated Contact with Patient 05/17/11 1713      Chief Complaint  Patient presents with  . V70.1    (Consider location/radiation/quality/duration/timing/severity/associated sxs/prior treatment) HPI Comments: Patient presents with suicidal thoughts and planned to overdose on her medications. She was sent here by her psychiatrist. She's had suicidal gestures in the past. She denies any taking anything today. Denies any hallucinations, homicidal ideation, chest pain, shortness of breath or abdominal pain.  The history is provided by the patient.    Past Medical History  Diagnosis Date  . Hypertension   . Asthma   . Sleep apnea   . Anxiety   . Depression   . Bipolar 1 disorder     Past Surgical History  Procedure Date  . Abdominal hysterectomy     No family history on file.  History  Substance Use Topics  . Smoking status: Never Smoker   . Smokeless tobacco: Not on file  . Alcohol Use: No    OB History    Grav Para Term Preterm Abortions TAB SAB Ect Mult Living                  Review of Systems  Constitutional: Negative for activity change and appetite change.  HENT: Negative for congestion and rhinorrhea.   Gastrointestinal: Negative for nausea, vomiting and abdominal pain.  Genitourinary: Negative for dysuria.  Musculoskeletal: Negative for back pain.  Neurological: Negative for headaches.  Psychiatric/Behavioral: Positive for suicidal ideas, confusion and self-injury. The patient is nervous/anxious.     Allergies  Codeine; Morphine and related; Tetracyclines & related; Tramadol; Ciprofloxacin; and Penicillins  Home Medications   Current Outpatient Rx  Name Route Sig Dispense Refill  . ALBUTEROL SULFATE HFA IN Inhalation Inhale 1 puff into the lungs 4 (four) times daily.      Marland Kitchen BENZTROPINE MESYLATE 1 MG PO TABS Oral Take 1 mg by mouth 2 (two) times daily.      Marland Kitchen CITALOPRAM  HYDROBROMIDE 40 MG PO TABS Oral Take 40 mg by mouth daily.      Marland Kitchen GABAPENTIN 600 MG PO TABS Oral Take 600 mg by mouth 3 (three) times daily.      Marland Kitchen GLYCOPYRROLATE 2 MG PO TABS Oral Take 2 mg by mouth 3 (three) times daily.      Marland Kitchen HALOPERIDOL 5 MG PO TABS Oral Take 5 mg by mouth 2 (two) times daily.      Marland Kitchen HYDROXYZINE HCL 25 MG PO TABS Oral Take 25 mg by mouth at bedtime.      Marland Kitchen NAPROXEN SODIUM 220 MG PO TABS Oral Take 220 mg by mouth 2 (two) times daily with a meal.    . PANTOPRAZOLE SODIUM 40 MG PO TBEC Oral Take 40 mg by mouth daily.      . SUMATRIPTAN SUCCINATE 100 MG PO TABS Oral Take 100 mg by mouth every 2 (two) hours as needed. migranes    . LISINOPRIL 5 MG PO TABS Oral Take 5 mg by mouth daily.      . LUBIPROSTONE 24 MCG PO CAPS Oral Take 24 mcg by mouth 2 (two) times daily with a meal.      . METHOCARBAMOL 500 MG PO TABS Oral Take 500 mg by mouth 3 (three) times daily.        BP 126/81  Pulse 82  Temp(Src) 98.4 F (36.9 C) (Oral)  Resp 18  Wt 242 lb (109.77 kg)  SpO2 99%  Physical Exam  Constitutional: She is oriented to person, place, and time. She appears well-developed and well-nourished.  HENT:  Head: Normocephalic and atraumatic.  Mouth/Throat: Oropharynx is clear and moist.  Eyes: Conjunctivae are normal. Pupils are equal, round, and reactive to light.  Neck: Normal range of motion.  Cardiovascular: Normal rate, regular rhythm and normal heart sounds.   Pulmonary/Chest: Effort normal and breath sounds normal. No respiratory distress.  Abdominal: Soft. There is no tenderness. There is no rebound and no guarding.  Musculoskeletal: Normal range of motion. She exhibits no edema and no tenderness.  Neurological: She is alert and oriented to person, place, and time. No cranial nerve deficit.  Skin: Skin is warm.    ED Course  Procedures (including critical care time)  Labs Reviewed  COMPREHENSIVE METABOLIC PANEL - Abnormal; Notable for the following:    Creatinine,  Ser 0.43 (*)    All other components within normal limits  URINE RAPID DRUG SCREEN (HOSP PERFORMED)  ETHANOL  CBC  DIFFERENTIAL   No results found.   No diagnosis found.    MDM  Suicidal ideation with plan to take medications. Patient denies acting on these thoughts.  History suicide precautions, check screening labs and discuss with act team        Glynn Octave, MD 05/18/11 0040

## 2011-05-17 NOTE — ED Notes (Signed)
Pt states "I was in my psychiatrist's office and told him I wanted to die; I would take all my medicines"

## 2011-05-18 ENCOUNTER — Encounter (HOSPITAL_COMMUNITY): Payer: Self-pay | Admitting: *Deleted

## 2011-05-18 ENCOUNTER — Inpatient Hospital Stay (HOSPITAL_COMMUNITY)
Admission: EM | Admit: 2011-05-18 | Discharge: 2011-05-28 | DRG: 885 | Disposition: A | Discharge status: Home / Self Care | Payer: Medicare Other | Source: Ambulatory Visit | Attending: Psychiatry | Admitting: Psychiatry

## 2011-05-18 DIAGNOSIS — G4700 Insomnia, unspecified: Secondary | ICD-10-CM

## 2011-05-18 DIAGNOSIS — J45909 Unspecified asthma, uncomplicated: Secondary | ICD-10-CM

## 2011-05-18 DIAGNOSIS — Z6379 Other stressful life events affecting family and household: Secondary | ICD-10-CM

## 2011-05-18 DIAGNOSIS — G4730 Sleep apnea, unspecified: Secondary | ICD-10-CM

## 2011-05-18 DIAGNOSIS — Z888 Allergy status to other drugs, medicaments and biological substances status: Secondary | ICD-10-CM

## 2011-05-18 DIAGNOSIS — R45851 Suicidal ideations: Secondary | ICD-10-CM

## 2011-05-18 DIAGNOSIS — Z881 Allergy status to other antibiotic agents status: Secondary | ICD-10-CM

## 2011-05-18 DIAGNOSIS — F332 Major depressive disorder, recurrent severe without psychotic features: Principal | ICD-10-CM

## 2011-05-18 DIAGNOSIS — F319 Bipolar disorder, unspecified: Secondary | ICD-10-CM | POA: Diagnosis present

## 2011-05-18 DIAGNOSIS — F603 Borderline personality disorder: Secondary | ICD-10-CM

## 2011-05-18 DIAGNOSIS — Z79899 Other long term (current) drug therapy: Secondary | ICD-10-CM

## 2011-05-18 DIAGNOSIS — I10 Essential (primary) hypertension: Secondary | ICD-10-CM

## 2011-05-18 MED ORDER — BENZTROPINE MESYLATE 1 MG PO TABS
ORAL_TABLET | ORAL | Status: AC
  Filled 2011-05-18: qty 1

## 2011-05-18 MED ORDER — CITALOPRAM HYDROBROMIDE 40 MG PO TABS
40.0000 mg | ORAL_TABLET | Freq: Every day | ORAL | Status: DC
  Administered 2011-05-19 – 2011-05-28 (×10): 40 mg via ORAL
  Filled 2011-05-18 (×3): qty 1
  Filled 2011-05-18: qty 7
  Filled 2011-05-18 (×7): qty 1

## 2011-05-18 MED ORDER — ACETAMINOPHEN 325 MG PO TABS: 650.0000 mg | ORAL_TABLET | Freq: Four times a day (QID) | ORAL | Status: DC | PRN

## 2011-05-18 MED ORDER — HALOPERIDOL LACTATE 5 MG/ML IJ SOLN
5.0000 mg | Freq: Four times a day (QID) | INTRAMUSCULAR | Status: DC | PRN
  Administered 2011-05-18 – 2011-05-24 (×4): 5 mg via INTRAMUSCULAR
  Filled 2011-05-18 (×5): qty 1

## 2011-05-18 MED ORDER — MAGNESIUM HYDROXIDE 400 MG/5ML PO SUSP: 30.0000 mL | Freq: Every day | ORAL | Status: DC | PRN

## 2011-05-18 MED ORDER — NAPROXEN SODIUM 220 MG PO TABS
220.0000 mg | ORAL_TABLET | Freq: Two times a day (BID) | ORAL | Status: DC
  Filled 2011-05-18 (×3): qty 1

## 2011-05-18 MED ORDER — PANTOPRAZOLE SODIUM 40 MG PO TBEC
40.0000 mg | DELAYED_RELEASE_TABLET | Freq: Every day | ORAL | Status: DC
  Administered 2011-05-19 – 2011-05-28 (×10): 40 mg via ORAL
  Filled 2011-05-18 (×10): qty 1
  Filled 2011-05-18: qty 7
  Filled 2011-05-18: qty 1

## 2011-05-18 MED ORDER — BENZTROPINE MESYLATE 1 MG PO TABS
1.0000 mg | ORAL_TABLET | Freq: Two times a day (BID) | ORAL | Status: DC
  Administered 2011-05-18: 11:00:00 via ORAL
  Administered 2011-05-18: 1 mg via ORAL
  Filled 2011-05-18 (×5): qty 1

## 2011-05-18 MED ORDER — SUMATRIPTAN SUCCINATE 100 MG PO TABS
100.0000 mg | ORAL_TABLET | ORAL | Status: DC | PRN
  Administered 2011-05-19 (×2): 100 mg via ORAL
  Filled 2011-05-18 (×2): qty 1

## 2011-05-18 MED ORDER — ALBUTEROL SULFATE HFA 108 (90 BASE) MCG/ACT IN AERS: 1.0000 | INHALATION_SPRAY | RESPIRATORY_TRACT | Status: DC | PRN

## 2011-05-18 MED ORDER — PRAZOSIN HCL 1 MG PO CAPS
1.0000 mg | ORAL_CAPSULE | Freq: Every day | ORAL | Status: DC
  Administered 2011-05-18: 1 mg via ORAL
  Filled 2011-05-18 (×3): qty 1

## 2011-05-18 MED ORDER — HALOPERIDOL 5 MG PO TABS
5.0000 mg | ORAL_TABLET | Freq: Two times a day (BID) | ORAL | Status: DC
  Administered 2011-05-18 – 2011-05-28 (×21): 5 mg via ORAL
  Filled 2011-05-18: qty 14
  Filled 2011-05-18: qty 1
  Filled 2011-05-18: qty 14
  Filled 2011-05-18 (×21): qty 1

## 2011-05-18 MED ORDER — HYDROXYZINE HCL 25 MG PO TABS
25.0000 mg | ORAL_TABLET | Freq: Every evening | ORAL | Status: DC | PRN
Start: 2011-05-18 — End: 2011-05-28
  Administered 2011-05-22 – 2011-05-26 (×6): 25 mg via ORAL

## 2011-05-18 MED ORDER — GABAPENTIN 600 MG PO TABS
600.0000 mg | ORAL_TABLET | Freq: Three times a day (TID) | ORAL | Status: DC
  Administered 2011-05-19 – 2011-05-28 (×28): 600 mg via ORAL
  Filled 2011-05-18 (×8): qty 1
  Filled 2011-05-18: qty 21
  Filled 2011-05-18 (×3): qty 1
  Filled 2011-05-18: qty 21
  Filled 2011-05-18 (×18): qty 1
  Filled 2011-05-18: qty 21
  Filled 2011-05-18: qty 1

## 2011-05-18 MED ORDER — BENZTROPINE MESYLATE 1 MG PO TABS
1.0000 mg | ORAL_TABLET | Freq: Two times a day (BID) | ORAL | Status: DC
  Administered 2011-05-18 – 2011-05-28 (×21): 1 mg via ORAL
  Filled 2011-05-18 (×6): qty 1
  Filled 2011-05-18: qty 14
  Filled 2011-05-18 (×5): qty 1
  Filled 2011-05-18: qty 14
  Filled 2011-05-18 (×10): qty 1

## 2011-05-18 MED ORDER — HYDROXYZINE HCL 25 MG PO TABS: 25.0000 mg | ORAL_TABLET | Freq: Every day | ORAL | Status: DC

## 2011-05-18 MED ORDER — ALUM & MAG HYDROXIDE-SIMETH 200-200-20 MG/5ML PO SUSP: 30.0000 mL | ORAL | Status: DC | PRN

## 2011-05-18 NOTE — Tx Team (Signed)
Initial Interdisciplinary Treatment Plan  PATIENT STRENGTHS: (choose at least two) Average or above average intelligence Capable of independent living General fund of knowledge Supportive family/friends  PATIENT STRESSORS: Health problems Marital or family conflict Medication change or noncompliance   PROBLEM LIST: Problem List/Patient Goals Date to be addressed Date deferred Reason deferred Estimated date of resolution  Depression      Risk for self harm-"I want to kill myself"      Conflict with children                                           DISCHARGE CRITERIA:  Ability to meet basic life and health needs Improved stabilization in mood, thinking, and/or behavior Motivation to continue treatment in a less acute level of care Verbal commitment to aftercare and medication compliance  PRELIMINARY DISCHARGE PLAN: Attend aftercare/continuing care group Outpatient therapy Return to previous living arrangement  PATIENT/FAMIILY INVOLVEMENT: This treatment plan has been presented to and reviewed with the patient, ELFIDA SHIMADA, and/or family member.  The patient and family have been given the opportunity to ask questions and make suggestions.  Jesus Genera [facility] 05/18/2011, 5:31 AM

## 2011-05-18 NOTE — Progress Notes (Signed)
Vol admit for depression and suicidal thoughts to OD.  "Every day I dream of killing myself."  During admission, pt was vague when asked what her stressors were that brought her to the hospital.  She did say she was having suicidal thoughts, but she contracted for safety with this Clinical research associate.  She states she lives with her parents and reports they are supportive.  ED notes report that her children are her stressors as they are involved with drug abuse.  Pt has been at [facility] multiple times and also at [facility] and [facility].  Pt presented with flat/sad affect.  Pt denies substance abuse.  Medical issues include, HTN, asthma, chronic back pain, and sleep apnea.  Pt reports she does not use a C-PAP at home.  ED reported that pt was having visual hallucinations on admit, but pt denies at this time.  Pt oriented to unit/room.  Safety maintained with q15 minute checks.

## 2011-05-18 NOTE — Progress Notes (Addendum)
1013   Patient was started on 1:1 .   Patient has SI thoughts, no plan, would not contract for safety.   Would not talk about her depression. 1040   N.P. Instructed Cogentin p.o. and Haldol IM injection to be given to patient which was done.  Patient laying in bed eyes closed.  Does not want to talk about her depression.  Respirations even and unlabored.  No signs of pain/distress noted on patient's face/body movements.   1:1 present per doctor order. On self inventory sheet, patient has poor sleep, poor appetite, low energy level, poor attention span.  Rated depression and hopelessness #10.  SI positive, would not contract, no plan.  No questions for staff, no discharge plans, would not discuss. 1200   Patient continues to rest in bed, opens and closes her eyes.  Resting comfortably.   No complaints of pain or distress, no signs of pain on face or body movements.   Respirations even and unlabored.  Patient continues to be checked and note written on her every 15 minutes by MHT.   Safety maintained. 1420   Patient 1:1 has been discontinued.   MHT will continue to check on patient, ask how she feels, etc. And relay information to nurse.  Nurse to continue note every 2 hours.  Next note due 1535.   Patient continues to lay in bed with eyes opening/closing.   No signs of distress noted on patient's face or body movements.  No complaints of pain.   MHT and nurse to continue close monitoring of patient. 1730   Patient has been monitored by MHT and nurse today.   Presently patient laying in bed.   Denied SI at this time.  Contracts for safety.   Denied HI.   Denied A/V hallucinations.   Denied pain.   Declined dinner meal tonight.

## 2011-05-18 NOTE — Tx Team (Signed)
Patient seen during discharge planning group and treatment team.  She advised of admitting to the hospital due to being suicidal with a plan to overdose.  She shared that she has become tired of fighting thoughts and dreams of ending her life.  She shared that she was receiving ECT at [facility] and was discharged and referred to [facility] for follow up on May 27, 2011.    She advised that she believes she would be better served at [facility] and wants to discharge to their facility.  Patient stated she believes she would act out on thoughts if discharged to the community.  MD to contact Dr. Rodman Pickle for possible referral to [facility] for continued ECT. Patient encouraged to take ownership of her feelings and to communicate with staff regarding thoughts of self harm or violence.  Patient informed that we are here to help and to support her.

## 2011-05-18 NOTE — Progress Notes (Signed)
Patient ID: [name] [name], female   DOB: 07-Apr-1966, 46 y.o.   MRN: [id number] Patient has been in bed for majority of evening. Awakened patient to meet with attending MD. After speaking with MD, patient went back to room to sleep. Interaction with patient was minimal, patient would answer questions with grunts. Had to awaken patient to give scheduled HS medications, took medications without incident. Verbalizes no concerns or complications this evening.
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
## 2011-05-18 NOTE — Tx Team (Addendum)
Interdisciplinary Treatment Plan Update (Adult)  Date:  05/18/2011  Time Reviewed:  10:11 AM   Progress in Treatment: Attending groups: Yes Participating in groups:  Yes Taking medication as prescribed:  Yes Tolerating medication: Yes Family/Significant othe contact made: No, requesting consent for contact with support system Patient understands diagnosis: Yes Discussing patient identified problems/goals with staff:  Yes Medical problems stabilized or resolved: Yes Denies suicidal/homicidal ideation: No, reports she is still having suicidal thoughts and is unsure whether she can contract - "I can't promise I'm not looking around here for things to harm myself." Issues/concerns per patient self-inventory:  No  Other:  New problem(s) identified: None  Reason for Continuation of Hospitalization: Depression Medication stabilization Suicidal ideation  Interventions implemented related to continuation of hospitalization:  Medication stabilization, group attendance - Tech to monitor patient q 15 minutes and asked questions regarding her ability to remain safe   Additional comments: Elmina would like to go to [facility]  Estimated length of stay: 4-5 days  Discharge Plan: discharge home with parents and follow up with [facility]  New goal(s):  To contact [facility] or [facility] for possible transfer to their facility for continued ECT treatment  Review of initial/current patient goals per problem list:   1.  Goal(s):  Reduce depression from 10 at admission to no more than 3 at discharge.  Met:  No  Target date:  By Discharge   As evidenced by:  Depression is at "10" currently  2.  Goal(s):  Deny SI for 48 hours prior to discharge.  Met:  No  Target date:  By Discharge   As evidenced by:  Patient is currently suicidal, states she cannot contract for safety.  Cannot say that she will not look for ways to hurt herself while in the  hospital  3.  Goal(s):  Reduce anxiety from 10 to 3.  Met:  No  Target date:  By Discharge   As evidenced by:  Anxiety is at "5" today.  4.  Goal(s):  Determine best course of action for repeated hospitalization  Met:  No  Target date:  By Discharge   As evidenced by:  Patient had ECT on 04/21/11; she believes she should be at [facility]   Attendees: Patient:  [name] [name] 05/18/2011 10:11 AM  Family:   05/18/2011 10:11 AM  Physician:  Dr Orson Aloe, MD 05/18/2011 10:11 AM  Nursing:   Quintella Reichert 05/18/2011 10:11 AM  Case Manager:  Juline Patch, LCSW 05/18/2011 10:11 AM  Counselor:  Angus Palms, LCSW 05/18/2011 10:11 AM  Other:  Reyes Ivan, LCSWA 05/18/2011 10:11 AM  Other:  Michele Rockers, RN, DON 05/18/2011 10:11 AM  Other:  Marni Griffon, LCAS 05/18/2011 10:11 AM  Other:  Everlene Balls, RN. ADON 05/18/2011 10:11 AM   Scribe for Treatment Team:   [name], 05/18/2011 10:11 AM

## 2011-05-18 NOTE — Progress Notes (Signed)
[facility] Group Notes:  (Counselor/Nursing/MHT/Case Management/Adjunct)    Type of Therapy:  Group Therapy  Participation Level:  Did Not Attend       [name] 05/18/2011  3:51 PM

## 2011-05-18 NOTE — Discharge Planning (Signed)
Spoke with Archie Patten at [facility] ECT [facility], and they have no beds available at this time. She spoke to Dr. Ilean Skill, and he voiced concern that patient would be under the impression she would be seen by him while inpatient, which would not be the case.  Case Manager advised that patient actually had stated preference for going to [facility], so would not be expecting or necessarily even wanting to see Dr. Ilean Skill.   Case Manager to call back tomorrow too (1) check bed status, (2) find out if Dr. Ilean Skill wishes to pursue, and (3) inform whether main reason for referral is specifically for ECT treatment.  Ambrose Mantle, LCSW 05/18/2011, 4:09 PM

## 2011-05-18 NOTE — H&P (Signed)
Psychiatric Admission Assessment Adult  Patient Identification:  CANDITA BORENSTEIN Date of Evaluation:  05/18/2011 Chief Complaint:  Bipolar  History of Present Illness:: Ms. Stanislawski is a 46 year old Caucasian female, admitted to [facility] from the [facility] with complaints of suicidal ideations. Patient reports, "I was thinking about committing suicide for several months. My life is not going well. I live with my parents, not because I can't afford a place of my own. Rather, I can't live by myself because of all the issues I have and encountering. My mental illness is one, and I have 2 children who are messing up their lives. One of my children is heavily on drugs (an addict), and the other one deals drugs (drug dealer). As I think about the suicide, I start to plan on how I am going to carry it out. I thought about overdosing on drugs. I had attempted suicide in the past. I have been to this hospital in the past. I don't think that I am in the right place this time around being here. I do not need to be in this hospital. I need to go to the [facility] in [place]. I need my ECT treatment restarted. I think [facility] will get it started if I go there. That is the only thing that helps me function some. I am not doing well right now. I received my last ECTon the 2nd day of January 2013 from [facility]. I have to get the treatment every 2 weeks to be alright. But [facility] said that they will not give me any more ECT treatments until I follow up with my doctor. That is the reason I am in this shape right now. The ECT treatment that I had on January 2nd is wearing out. I must go to the [facility]"  Mood Symptoms:  Hopelessness, mood swings, sadness, SI and agitations  Depression Symptoms:  depressed mood, insomnia, psychomotor agitation, difficulty concentrating, hopelessness, suicidal thoughts with specific plan, anxiety, (Hypo) Manic Symptoms:  Elevated  Mood, Irritable Mood, Labiality of Mood, Agitation Anxiety Symptoms:  Excessive Worry, Psychotic Symptoms:  Hallucinations: None  PTSD Symptoms: None reported  Past Psychiatric History: Diagnosis: Bipolar disorder, rapid cycling  Hospitalizations: [facility],   Outpatient Care: [place], [facility].  Substance Abuse Care: Denies  Self-Mutilation: None reported  Suicidal Attempts: None this time, however, Hx. of suicide attempt remotely  Violent Behaviors: None reported   Past Medical History:   Past Medical History  Diagnosis Date  . Hypertension   . Asthma   . Sleep apnea   . Anxiety   . Depression   . Bipolar 1 disorder    None. Allergies:   Allergies  Allergen Reactions  . Codeine Nausea And Vomiting  . Morphine And Related Other (See Comments)    Memory loss  . Tetracyclines & Related Hives  . Tramadol Other (See Comments)    Memory loss  . Ciprofloxacin Rash  . Penicillins Hives and Rash   PTA Medications: Prescriptions prior to admission  Medication Sig Dispense Refill  . ALBUTEROL SULFATE HFA IN Inhale 1 puff into the lungs 4 (four) times daily.        . benztropine (COGENTIN) 1 MG tablet Take 1 mg by mouth 2 (two) times daily.        . citalopram (CELEXA) 40 MG tablet Take 40 mg by mouth daily.        Marland Kitchen gabapentin (NEURONTIN) 600 MG tablet Take 600  mg by mouth 3 (three) times daily.        . haloperidol (HALDOL) 5 MG tablet Take 5 mg by mouth 2 (two) times daily.        . hydrOXYzine (ATARAX/VISTARIL) 25 MG tablet Take 25 mg by mouth at bedtime.        . naproxen sodium (ANAPROX) 220 MG tablet Take 220 mg by mouth 2 (two) times daily with a meal.      . pantoprazole (PROTONIX) 40 MG tablet Take 40 mg by mouth daily.        . SUMAtriptan (IMITREX) 100 MG tablet Take 100 mg by mouth every 2 (two) hours as needed. migranes      . glycopyrrolate (ROBINUL) 2 MG tablet Take 2 mg by mouth 3 (three) times daily.        Marland Kitchen lisinopril (PRINIVIL,ZESTRIL) 5 MG tablet Take 5  mg by mouth daily.        Marland Kitchen lubiprostone (AMITIZA) 24 MCG capsule Take 24 mcg by mouth 2 (two) times daily with a meal.        . methocarbamol (ROBAXIN) 500 MG tablet Take 500 mg by mouth 3 (three) times daily.          Previous Psychotropic Medications:  Medication/Dose                 Substance Abuse History in the last 12 months: Substance Age of 1st Use Last Use Amount Specific Type  Nicotine "I don't smoke, use drugs and or drink alcohol"     Alcohol      Cannabis      Opiates      Cocaine      Methamphetamines      LSD      Ecstasy      Benzodiazepines      Caffeine      Inhalants      Others:                         Consequences of Substance Abuse: Medical Consequences:  Liver damage Legal Consequences:  Arrests, jail time Family Consequences:  Family discord  Social History: Current Place of Residence: [place], dispensing of Birth:  [place] Family Members: Parents Marital Status:  Separated Children:  Sons: 3  Daughters: 0 Relationships: parents Education:  HS Graduate  History of Abuse (Emotional/Phsycial/Sexual): None reported Occupational Experiences: Disabled Hotel manager History:  None. Legal History: None reported   Family History:  No family history on file.  Mental Status Examination/Evaluation: Objective:  Appearance: Casual and facial flushing  Eye Contact::  Fair  Speech:  Pressured  Volume:  Increased  Mood:  "My mood sucks right now"  Affect:  Blunt and Flat  Thought Process:  Coherent and Logical  Orientation:  Full  Thought Content:  Rational  Suicidal Thoughts:  Yes.  with intent/plan  Homicidal Thoughts:  No  Memory:  Immediate;   Good Recent;   Good Remote;   Good  Judgement:  Impaired  Insight:  Good  Psychomotor Activity:  Normal  Concentration:  Fair  Recall:  Good  Akathisia:  No  Handed:  Right  AIMS (if indicated):     Assets:  Desire for Improvement  Sleep:  Number of Hours: 0            Assessment:     AXIS I:  Bipolar, Manic with rapid cycling AXIS II:  Deferred AXIS III:   Past Medical History  Diagnosis Date  . Hypertension   . Asthma   . Sleep apnea   . Anxiety   . Depression   . Bipolar 1 disorder    AXIS IV:  other psychosocial or environmental problems AXIS V:  11-20 some danger of hurting self or others possible OR occasionally fails to maintain minimal personal hygiene OR gross impairment in communication  Treatment Plan/Recommendations:                                                                 Admit for safety and stabilization.                                                                Haldol IM injections 5 mg Q 6 hours for agitation.                                                                Add cogentin 1 mg bid.                                                    Treatment Plan Summary: Daily contact with patient to assess and evaluate symptoms and progress in treatment Medication management Current Medications:  Current Facility-Administered Medications  Medication Dose Route Frequency Provider Last Rate Last Dose  . acetaminophen (TYLENOL) tablet 650 mg  650 mg Oral Q6H PRN Sanjuana Kava, NP      . alum & mag hydroxide-simeth (MAALOX/MYLANTA) 200-200-20 MG/5ML suspension 30 mL  30 mL Oral Q4H PRN Sanjuana Kava, NP      . benztropine (COGENTIN) 1 MG tablet           . benztropine (COGENTIN) tablet 1 mg  1 mg Oral BID Sanjuana Kava, NP   1 mg at 05/18/11 1038  . haloperidol lactate (HALDOL) injection 5 mg  5 mg Intramuscular Q6H PRN Sanjuana Kava, NP   5 mg at 05/18/11 1040  . magnesium hydroxide (MILK OF MAGNESIA) suspension 30 mL  30 mL Oral Daily PRN Sanjuana Kava, NP       Facility-Administered Medications Ordered in Other Encounters  Medication Dose Route Frequency Provider Last Rate Last Dose  . DISCONTD: ibuprofen (ADVIL,MOTRIN) tablet 600 mg  600 mg Oral Q8H PRN Glynn Octave, MD      . DISCONTD: LORazepam (ATIVAN) tablet 1 mg  1 mg  Oral Q8H PRN Glynn Octave, MD      . DISCONTD: ondansetron (ZOFRAN) tablet 4 mg  4 mg Oral Q8H PRN Glynn Octave, MD        Observation Level/Precautions:  1 to 1  Laboratory: Reviewed and noted ED lab findings on record.  Psychotherapy:  Group  Medications:  See lists  Routine PRN Medications:  Yes  Consultations:  None indicated  Discharge Concerns:  Safety  Other:     Armandina Stammer I 1/29/201311:00 AM

## 2011-05-18 NOTE — Progress Notes (Signed)
[facility] Group Notes:  (Counselor/Nursing/MHT/Case Management/Adjunct)    Type of Therapy:  Group Therapy  Participation Level:  Did Not Attend    [name] [name] 05/18/2011  2:52 PM

## 2011-05-18 NOTE — Progress Notes (Signed)
Per State Regulation 482.30  This chart was reviewed for medical necessity with respect to the patient's Admission/Duration of stay.   Next review due:  05/21/11   Ambrose Mantle, LCSW  05/18/2011  1:33 PM

## 2011-05-18 NOTE — Discharge Planning (Signed)
Met with patient in her room, where she was laying down and refused to get up to talk.  States she lives with her parents, and will go back there at discharge.  They can pick her up from the hospital.  She sees Dr. Malachy Mood at [facility] for ECT (last on 04/21/11) and regularly gets her medications prescribed at [facility].  She sees Dr. Huntley Estelle on [facility] (agency unknown name) for therapy.  Patient reports that today her depression is 10/10 and anxiety is 5/10.  She reports that she remains suicidal with a plan to overdose.  States she does not feel safe in the hospital, is unable to contract for safety, reporting that she cannot say she will not look for ways to hurt herself.  Patient further states that she has no goal for her [facility] hospitalization, as she does not feel she needs to be here, but rather feels she should be at [facility].  This information was conveyed to the Treatment Team.  Ambrose Mantle, LCSW 05/18/2011, 2:10 PM

## 2011-05-19 MED ORDER — PRAZOSIN HCL 1 MG PO CAPS
2.0000 mg | ORAL_CAPSULE | Freq: Every evening | ORAL | Status: DC | PRN
  Administered 2011-05-19 – 2011-05-20 (×2): 2 mg via ORAL
  Filled 2011-05-19 (×2): qty 2

## 2011-05-19 MED ORDER — NAPROXEN 250 MG PO TABS
250.0000 mg | ORAL_TABLET | Freq: Two times a day (BID) | ORAL | Status: DC
  Administered 2011-05-19 – 2011-05-28 (×20): 250 mg via ORAL
  Filled 2011-05-19 (×7): qty 1
  Filled 2011-05-19: qty 14
  Filled 2011-05-19 (×9): qty 1
  Filled 2011-05-19: qty 14
  Filled 2011-05-19 (×5): qty 1

## 2011-05-19 MED ORDER — ENSURE CLINICAL ST REVIGOR PO LIQD
237.0000 mL | Freq: Three times a day (TID) | ORAL | Status: DC
  Administered 2011-05-20 – 2011-05-21 (×3): 237 mL via ORAL
  Administered 2011-05-21: 08:00:00 via ORAL
  Administered 2011-05-22: 237 mL via ORAL
  Administered 2011-05-22: 23:00:00 via ORAL
  Administered 2011-05-22 – 2011-05-23 (×4): 237 mL via ORAL
  Administered 2011-05-24: 07:00:00 via ORAL
  Administered 2011-05-24 – 2011-05-26 (×8): 237 mL via ORAL
  Administered 2011-05-27: 07:00:00 via ORAL
  Administered 2011-05-27 – 2011-05-28 (×5): 237 mL via ORAL
  Filled 2011-05-19 (×31): qty 237

## 2011-05-19 MED ORDER — MAGNESIUM CITRATE PO SOLN
1.0000 | Freq: Once | ORAL | Status: AC
  Administered 2011-05-19: 1 via ORAL

## 2011-05-19 MED ORDER — DOCUSATE SODIUM 100 MG PO CAPS
100.0000 mg | ORAL_CAPSULE | Freq: Every day | ORAL | Status: DC
  Administered 2011-05-19 – 2011-05-28 (×9): 100 mg via ORAL
  Filled 2011-05-19: qty 7
  Filled 2011-05-19 (×11): qty 1

## 2011-05-19 NOTE — Progress Notes (Signed)
Patient ID: ALYSSAMARIE MOUNSEY, female   DOB: [birth year], 46 y.o.   MRN: [id number] Pt noted that with the Minipress last night she did not have any nightmares.  This was new and quite welcomed.  She says her sleep was not entirely consistent and so she is agreeable with increasing the dose for more optimal effect. She reported that she has had a migraine all day.  She described a time where she was feeling dizzy perhaps from the Imitrex while walking down the hall.  She had asked a staff to help her because she was feeling dizzy and when she started to wilt she just slid down on the floor.  She denied hitting any bony body parts like her knees, hips, elbows or her head.  She made it back to her room and was found by me later laying on her bed resting. She was easily arousal verbally.  She stated that her headache has been bothering her all day.  She has had continued SI, but she is managing to keep herself safe.  She has done well with being off the 1:1. Continue current treatment plan except increase the Minipress at HS.

## 2011-05-19 NOTE — Discharge Planning (Signed)
[facility] ECT intake several times without success to follow up on possibility of referral.  Finally reached them and they asked that we call tomorrow as there are no beds today.  Ambrose Mantle, LCSW 05/19/2011, 3:41 PM

## 2011-05-19 NOTE — H&P (Signed)
Have seen and examined this patient and agree with the evaluation. 

## 2011-05-19 NOTE — Progress Notes (Signed)
Patient in bed all day. She did not participate in groups. She also has been refusing to eat. Pt stated; "no reason to eat". She appeared sad and depressed. Writer consulted with physician. Dr [name] gave verbal order to give ensure prn. Q 15 minute check continues to maintain safety.

## 2011-05-19 NOTE — Progress Notes (Signed)
[facility] Group Notes:  (Counselor/Nursing/MHT/Case Management/Adjunct)  05/19/2011 2:15 PM  Type of Therapy:  Group Therapy  Participation Level:  Did Not Attend  Wilmon Arms 05/19/2011, 2:15 PM  Cosigned by: Angus Palms, LCSW

## 2011-05-20 NOTE — Discharge Planning (Signed)
Met with patient in her room.  Affect essentially flat.  Depressed mood.  States she still wants to go to [facility] for ECT, understands will not see her outpatient provider there.  Case Manager has called several times today, no beds yet available.  Ambrose Mantle, LCSW 05/20/2011, 3:31 PM

## 2011-05-20 NOTE — Progress Notes (Signed)
1730   Patient in her room, moving chair toward window in her room.   Nurse repeatedly asked patient to move away from chair and patient continued to move toward window with chair.   AC, MHT's and nurse in room encouraging patient to relax and lay in bed.   AC holding arms, walked outside of room, patient sat in floor while AC holding arms.  1740   Patient was walked to quiet room while [facility] holding patient's arms, then [facility] gave patient Haldol 5 mg IM left deltoid.   After injection, patient tried to get off mattress.  Patient started beating walls of quiet room.  Door closed to quiet room.   1745  DON and Asst DON in area of quiet room.   DON talked with patient who became quiet and then laid on mattress with door opened to quiet room.  Patient had received her 1700 medications earlier at the medication window on 500 hall.  Patient told nurse that she was SI with no plan.  "I'm trying to come up with one.  If I told you that I'm not SI or if I contract, I would be lying.   Can't get it out of my head that I'm trying to kill myself and my boys.  I need to kill myself so I won't hurt my boys."   Refused to contract for safety. 1755  Patient laying on mattress in quiet room with 1:1 present and with nurse.  Door to quiet room open.  Vital signs taken while patient laying on mattress in quiet room with 1:1 present. BP 114/78  P83,  R16, T97.7.   1:1 continues for safety.  Patient continues to lay on mattress with door opened.  Patient refused food, drink, toileting.  Patient resting comfortably on mattress in quiet room on her left side.  No signs of pain or distress noted on patient's face or body movement.   Respirations even and unlabored.  Safety maintained.   Will continue to monitor with 1:1 for safety per doctor order. 1815  Patient resting comfortably on mattress in quiet room.  Respirations even and unlabored.  No signs of pain or distress noted on patient's face or body movements.  1:1 continues for safety  per doctor order. 1820  Patient continues to rest comfortably on mattress in quiet room.  1:1 continues for safety per MD order.  Patient out of seclusion, patient asleep. 1845   Patient escorted back to her room with [facility] and 1:1 present.  Patient contracts for safety.   No signs of pain, distress or discomfort noted on patient's face or body movements.  Patient resting comfortably in her room with 1:1 present per doctor's order.

## 2011-05-20 NOTE — Progress Notes (Signed)
On self inventory sheet, patient sleeps well, has poor appetite, low energy level, poor attention span.   Rated depression and hopelessness #10.   Anxiety #10.  SI positive, no plan, contracts for safety.  Headache in past 24 hours.  Zero pain goal, has had headache #6 in past 24 hours.   Does not know what changes she will make after discharge.  No questions for staff.   Plans to go to mom's house.  No problems taking meds after discharge.

## 2011-05-20 NOTE — Progress Notes (Signed)
Lying quietly in bed with eyes closed.  Exhibiting normal sleep behavior.  Q 15 min checks in progress to assure safety.

## 2011-05-20 NOTE — Progress Notes (Signed)
Pt is calm and cooperative at this time. Pt is positive for SI. Pt was hesitant to contract for safety at this time. Pt was greeted a the bedside. Pt requested her prn sleep med at 8pm. Med has been effective. Pt safety remains with 1:1 continuous observation

## 2011-05-20 NOTE — Progress Notes (Signed)
[facility] MD Progress Note  05/20/2011 2:31 PM  Patient reports during rounds, "I'm fine. My sleep is ok. I am not eating because I don't have an appetite. My mood is down. I am still feeling suicidal. I have not thought of plans today"  Diagnosis:   Axis I: Bipolar 1 disorder, rapid cycling. Axis II: Deferred Axis III:  Past Medical History  Diagnosis Date  . Hypertension   . Asthma   . Sleep apnea   . Anxiety   . Depression   . Bipolar 1 disorder    Axis IV: No changes Axis V: 41-50 serious symptoms  ADL's:  Intact  Sleep: Fair "I guess I sleep O. K.  Appetite:  Poor, "I don't have an appetite"  Suicidal Ideation: "Yes" Plan:  "I have not thought about that today" Intent:  "No" Means:  "No" Homicidal Ideation:  "No" Plan:  "No" Intent:  "No" Means:  "No"  AEB (as evidenced by): Per patient's report.  Mental Status Examination/Evaluation: Objective:  Appearance: Casual  Eye Contact::  Minimal  Speech:  Clear and Coherent  Volume:  decreased  Mood:  Angry, Depressed and Irritable  Affect:  Blunt  Thought Process:  Logical  Orientation:  Full  Thought Content:  Hallucinations: None  Suicidal Thoughts:  Yes.  without intent/plan  Homicidal Thoughts:  No  Memory:  Immediate;   Good  Judgement:  Impaired  Insight:  Fair  Psychomotor Activity:  Normal  Concentration:  Fair  Recall:  Good  Akathisia:  No  Handed:  Right  AIMS (if indicated):     Assets:  Desire for Improvement  Sleep:  Number of Hours: 6.75    Vital Signs:Blood pressure 115/77, pulse 85, temperature 98.3 F (36.8 C), temperature source Oral, resp. rate 18, height 5' 3.78" (1.62 m), weight 102.967 kg (227 lb), SpO2 97.00%. Current Medications: Current Facility-Administered Medications  Medication Dose Route Frequency Provider Last Rate Last Dose  . acetaminophen (TYLENOL) tablet 650 mg  650 mg Oral Q6H PRN Sanjuana Kava, NP      . albuterol (PROVENTIL HFA;VENTOLIN HFA) 108 (90 BASE) MCG/ACT  inhaler 1 puff  1 puff Inhalation Q4H PRN Orson Aloe, MD      . alum & mag hydroxide-simeth (MAALOX/MYLANTA) 200-200-20 MG/5ML suspension 30 mL  30 mL Oral Q4H PRN Sanjuana Kava, NP      . benztropine (COGENTIN) tablet 1 mg  1 mg Oral BID Orson Aloe, MD   1 mg at 05/20/11 0824  . citalopram (CELEXA) tablet 40 mg  40 mg Oral Daily Orson Aloe, MD   40 mg at 05/20/11 0825  . docusate sodium (COLACE) capsule 100 mg  100 mg Oral Daily Sanjuana Kava, NP   100 mg at 05/19/11 1511  . feeding supplement (ENSURE CLINICAL STRENGTH) liquid 237 mL  237 mL Oral TID WC Orson Aloe, MD   237 mL at 05/20/11 1217  . gabapentin (NEURONTIN) tablet 600 mg  600 mg Oral TID Orson Aloe, MD   600 mg at 05/20/11 1216  . haloperidol (HALDOL) tablet 5 mg  5 mg Oral BID Orson Aloe, MD   5 mg at 05/20/11 0826  . haloperidol lactate (HALDOL) injection 5 mg  5 mg Intramuscular Q6H PRN Sanjuana Kava, NP   5 mg at 05/18/11 1040  . hydrOXYzine (ATARAX/VISTARIL) tablet 25 mg  25 mg Oral QHS PRN Orson Aloe, MD      . magnesium citrate solution 1 Bottle  1 Bottle  Oral Once Sanjuana Kava, NP   1 Bottle at 05/19/11 1511  . magnesium hydroxide (MILK OF MAGNESIA) suspension 30 mL  30 mL Oral Daily PRN Sanjuana Kava, NP      . naproxen (NAPROSYN) tablet 250 mg  250 mg Oral BID WC Orson Aloe, MD   250 mg at 05/20/11 0827  . pantoprazole (PROTONIX) EC tablet 40 mg  40 mg Oral Daily Orson Aloe, MD   40 mg at 05/20/11 0827  . prazosin (MINIPRESS) capsule 2 mg  2 mg Oral QHS PRN,MR X 1 Orson Aloe, MD   2 mg at 05/19/11 2236  . SUMAtriptan (IMITREX) tablet 100 mg  100 mg Oral Q2H PRN Orson Aloe, MD   100 mg at 05/19/11 1119  . DISCONTD: prazosin (MINIPRESS) capsule 1 mg  1 mg Oral QHS Orson Aloe, MD   1 mg at 05/18/11 2231    Lab Results: No results found for this or any previous visit (from the past 48 hour(s)).  Physical Findings: AIMS:  , ,  ,  ,    CIWA:    COWS:     Treatment Plan Summary: Daily contact with  patient to assess and evaluate symptoms and progress in treatment Medication management  Plan: Continue current treatment plan.  Armandina Stammer I 05/20/2011, 2:31 PM

## 2011-05-21 DIAGNOSIS — F3110 Bipolar disorder, current episode manic without psychotic features, unspecified: Secondary | ICD-10-CM

## 2011-05-21 MED ORDER — MEGESTROL ACETATE 40 MG PO TABS
40.0000 mg | ORAL_TABLET | Freq: Two times a day (BID) | ORAL | Status: DC
  Administered 2011-05-21 – 2011-05-28 (×15): 40 mg via ORAL
  Filled 2011-05-21 (×4): qty 1
  Filled 2011-05-21: qty 10
  Filled 2011-05-21 (×2): qty 1
  Filled 2011-05-21: qty 10
  Filled 2011-05-21 (×11): qty 1

## 2011-05-21 MED ORDER — PRAZOSIN HCL 1 MG PO CAPS
2.0000 mg | ORAL_CAPSULE | Freq: Every evening | ORAL | Status: DC | PRN
  Administered 2011-05-22 – 2011-05-26 (×2): 2 mg via ORAL
  Filled 2011-05-21 (×2): qty 2

## 2011-05-21 MED ORDER — PRAZOSIN HCL 2 MG PO CAPS
4.0000 mg | ORAL_CAPSULE | Freq: Every day | ORAL | Status: DC
  Administered 2011-05-21 – 2011-05-25 (×4): 4 mg via ORAL
  Filled 2011-05-21 (×3): qty 2
  Filled 2011-05-21 (×2): qty 4
  Filled 2011-05-21 (×3): qty 2

## 2011-05-21 NOTE — Discharge Planning (Addendum)
Did referral to [facility].  Also spoke to [facility], and they might be able to transfer there Monday, awaiting call back.  Ambrose Mantle, LCSW 05/21/2011, 2:34 PM  Per State Regulation 482.30  This chart was reviewed for medical necessity with respect to the patient's Admission/Duration of stay.   Next review due:  05/24/11   Ambrose Mantle, LCSW  05/21/2011  2:36 PM

## 2011-05-21 NOTE — Progress Notes (Signed)
[facility] Group Notes:  (Counselor/Nursing/MHT/Case Management/Adjunct)  05/21/2011 2:24 PM  Type of Therapy:  Group Therapy  Participation Level:  Did Not Attend  [name] [name] 05/21/2011, 2:24 PM  Cosigned by: Angus Palms, LCSW

## 2011-05-21 NOTE — Progress Notes (Signed)
Patient ID: [name] [name], female   DOB: Jun 19, 1965, 46 y.o.   MRN: [id number] Patient remians on 1:1. Guarded, no interaction with 1:1 staff.

## 2011-05-21 NOTE — Progress Notes (Signed)
Patient ID: [name] [name], female   DOB: 1966/04/19, 46 y.o.   MRN: [id number] Pt has been placed on 1:1 because she got considerably upset yesterday from a dream or thinking that she knew that her committing suicide would be hard on her 3 children, so she thought it would be best if she escaped and gathered her children up and killed them and then killed herself.  This was very disturbing to her and she did not want to carry this plan out.  She explained this to staff and she was placed on a 1:1.  Today she reports that her sleep is very poor.  Her facial expression is one of total misery.  She is extremely sad and uncomfortable.  She describes her depression and anxiety as 10/10 on a scale where 1 is the best and 10 is the worst.  Her 1:1 is continued today because she can not contract for safety.

## 2011-05-21 NOTE — Tx Team (Signed)
Interdisciplinary Treatment Plan Update (Adult)  Date:  05/21/2011  Time Reviewed:  10:15AM-11:00AM  Progress in Treatment: Attending groups:  No, will not get out of bed for group Participating in groups: No     Taking medication as prescribed:    Yes Tolerating medication:   Yes Family/Significant other contact made:  No, consent not given Patient understands diagnosis:   Yes Discussing patient identified problems/goals with staff: Yes, discussing suicidal thoughts    Medical problems stabilized or resolved:   Yes Denies suicidal/homicidal ideation:  No very suicidal Issues/concerns per patient self-inventory:   None Other:  New problem(s) identified: Yes, Describe:  Is on 1:1 now  Reason for Continuation of Hospitalization: Depression Medication stabilization Suicidal ideation Other; describe Placement needed into ECT program  Interventions implemented related to continuation of hospitalization:  Medication monitoring and adjustment, safety checks Q15 min., suicide risk assessment, group therapy, psychoeducation, collateral contact, aftercare planning, ongoing physician assessments, medication education, 1:1 as long as patient cannot contract for safety on the unit  Additional comments:  Not applicable  Estimated length of stay:    Discharge Plan:  Discharge to [facility]  or [facility] for ECT  New goal(s):  Not applicable  Review of initial/current patient goals per problem list:   1.  Goal(s):  Reduce depression from 10 at admission to no more than 3 at discharge.  Met:  No  Target date:  By Discharge   As evidenced by: Rosey Bath is rating depression at 10 today  2.  Goal(s):  Deny SI for 48 hours prior to discharge.  Met:  No  Target date:  By Discharge   As evidenced by: Rosey Bath continues to be suicidal  3.  Goal(s):  Reduce anxiety from 10 at admission to no more than 3 at discharge.  Met:  No  Target date:  By Discharge   As evidenced by:  Rosey Bath is rating depression at 10 today  4.  Goal(s):  Determine best course of action for repeated hospitalization.  Met:  Yes  Target date:  By Discharge   As evidenced by:  Patient believes will benefit from continued ECT - being referred to hospital which does this.  5.  Goal(s):  Transfer to another hospital for ECT  Met:  No  Target date:  By Discharge   As evidenced by:  Calls made daily  6.  Goal(s):  Get off 1:1 - No inappropriate behaviors.  Met:  No  Target date:  By Discharge   As evidenced by:  Patient is on 1:1  Attendees: Patient:  [name] [name]  05/21/2011 10:30AM  Family:     Physician:  Dr. Orson Aloe 05/21/2011 10:30AM  Nursing:  Barrie Folk, RN 05/21/2011 10:30AM    Case Manager:  Ambrose Mantle, LCSW 05/21/2011 10:30AM  Counselor:  Angus Palms, LCSW 05/21/2011 10:30AM  Other:   Reyes Ivan, LCSW-A 05/21/2011 10:30am  Other:   Juline Patch, LCSW 05/21/2011 10:30am  Other:   Wilmon Arms, counseling Intern 05/21/2011 10:27 AM  Other:   Omelia Blackwater, RN Other:   Charlyne Mom, doctoral psych intern 05/21/2011 10:26 AM 05/21/2011 10:26 AM   Scribe for Treatment Team:   Sarina Ser, 05/21/2011, 10:06 AM

## 2011-05-22 IMAGING — CR DG CHEST 2V
2 series · 2 of 2 positions shown · non-contrast
Comparison: Chest 09/22/2009.

CLINICAL DATA: Fall, pain.

CHEST - 2 VIEW

[w chest pa]
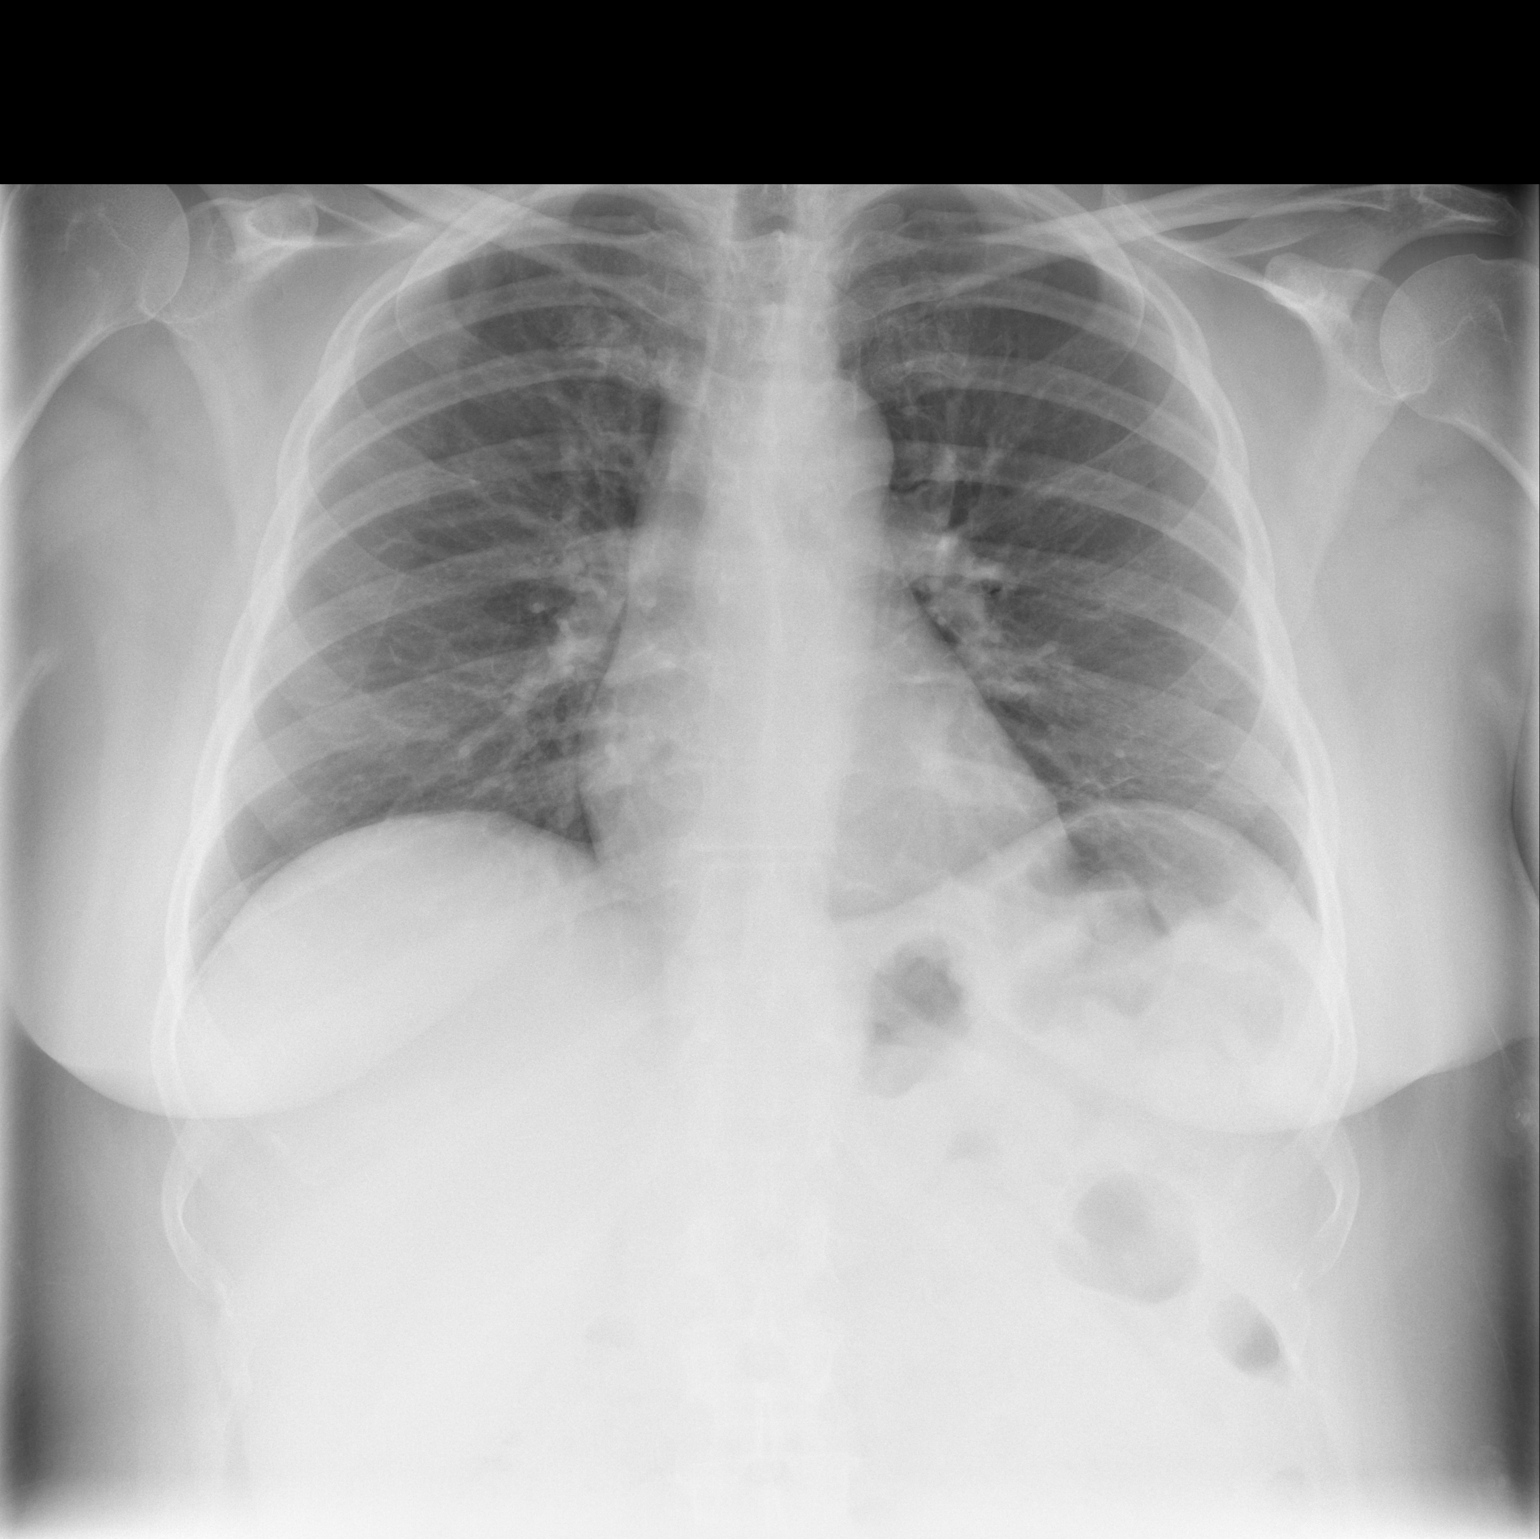

[w chest lat]
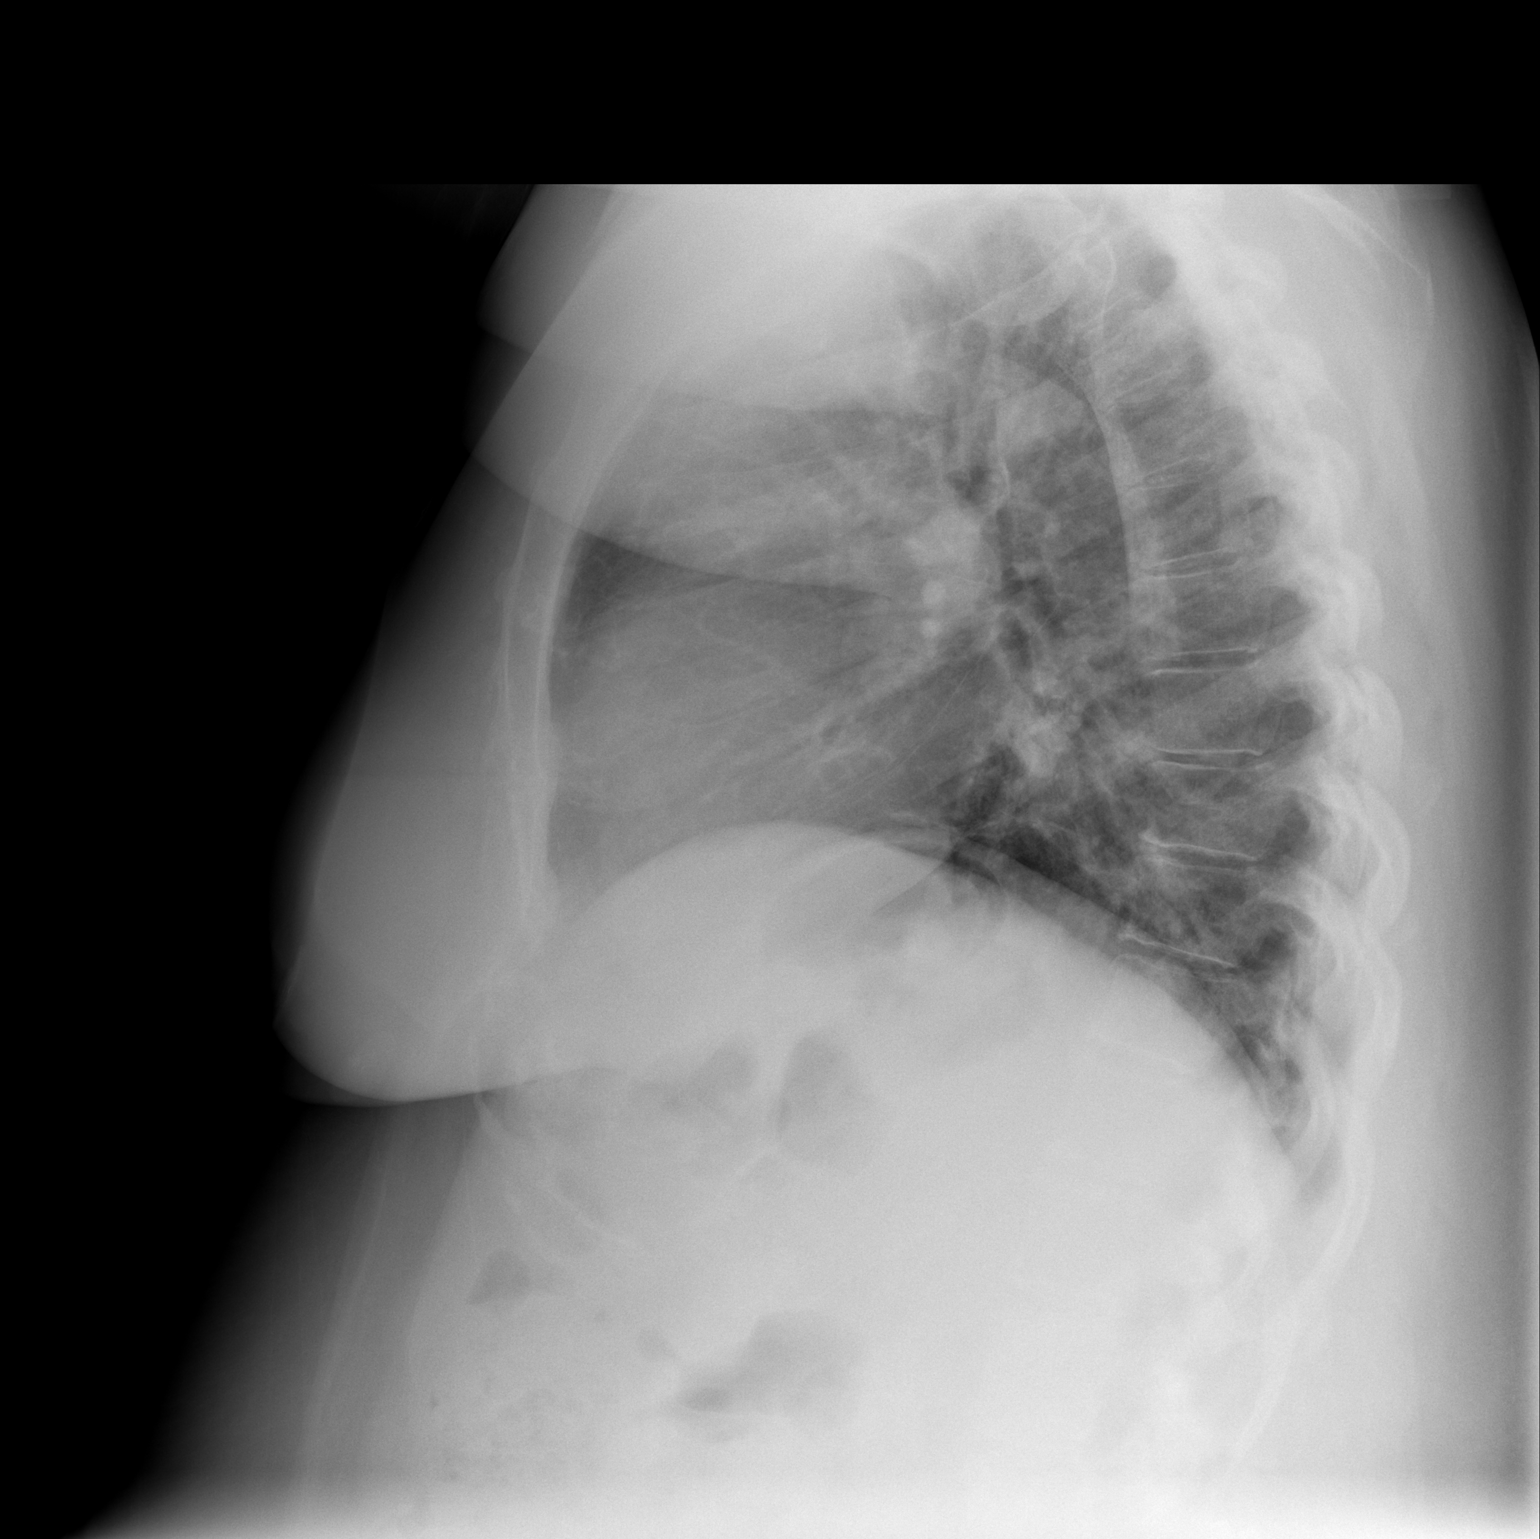

[2 of 2 positions shown; findings below may reference images not displayed]

FINDINGS: Port-A-Cath present on the prior study has been removed.
Lungs are clear.  No pneumothorax or pleural effusion.  Heart size
normal.  No focal bony abnormality.
IMPRESSION: Negative chest.

## 2011-05-22 IMAGING — CR DG RIBS 2V*R*
3 series · 3 of 3 positions shown · non-contrast
Comparison: Chest 09/22/2009.

CLINICAL DATA: Fall, pain.

RIGHT RIBS - 2 VIEW

[w ribs ap/pa upper right]
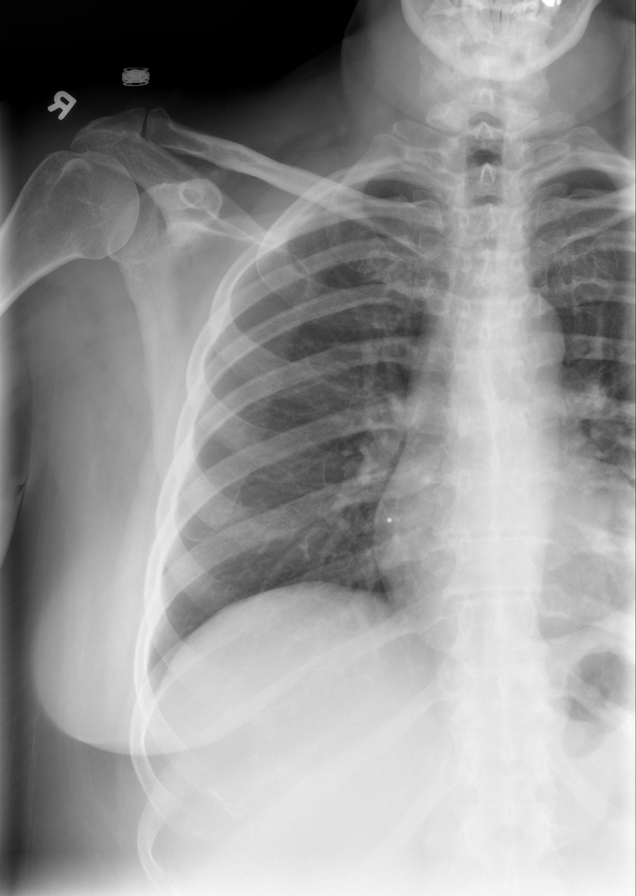

[w ribs oblique right]
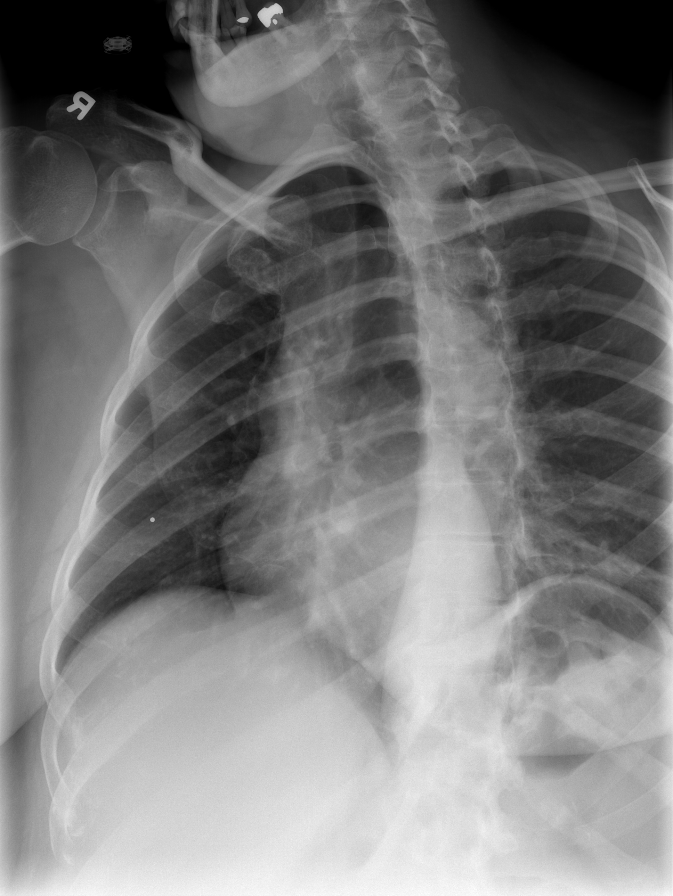

[w ribs ap/pa lower right]
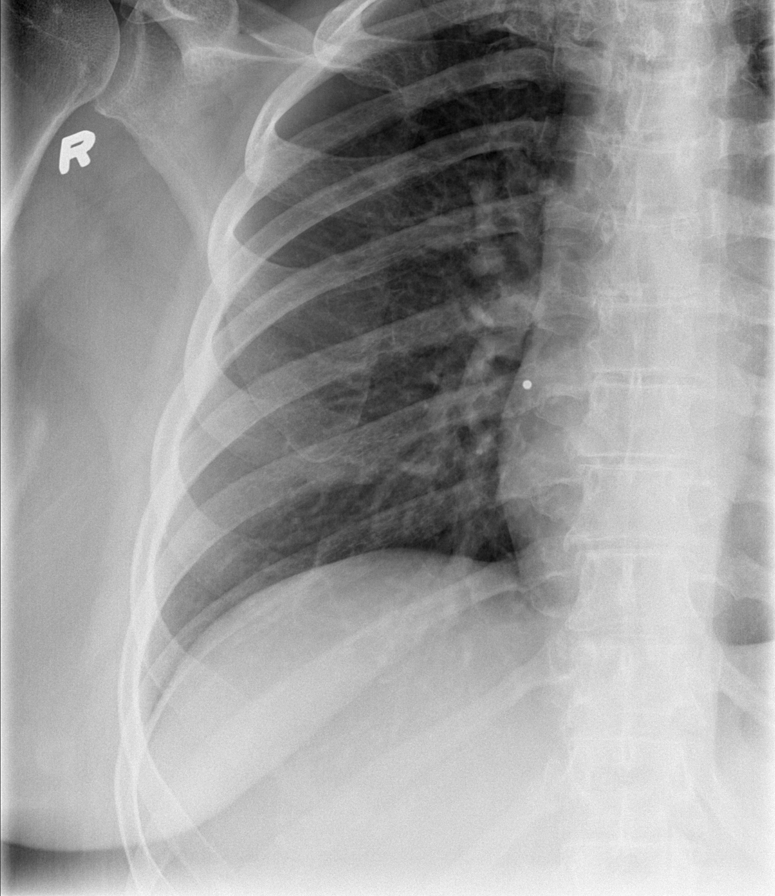

[3 of 3 positions shown; findings below may reference images not displayed]

FINDINGS: No right pneumothorax is identified.  No rib fracture.
IMPRESSION: Negative exam.

## 2011-05-22 MED ORDER — LORAZEPAM 2 MG/ML IJ SOLN
2.0000 mg | Freq: Once | INTRAMUSCULAR | Status: AC
Start: 2011-05-22 — End: 2011-05-22
  Administered 2011-05-22 (×2): 2 mg via INTRAMUSCULAR

## 2011-05-22 MED ORDER — LORAZEPAM 2 MG/ML IJ SOLN
INTRAMUSCULAR | Status: AC
  Administered 2011-05-22: 2 mg via INTRAMUSCULAR
  Filled 2011-05-22: qty 1

## 2011-05-22 MED ORDER — LITHIUM CARBONATE ER 300 MG PO TBCR
300.0000 mg | EXTENDED_RELEASE_TABLET | Freq: Two times a day (BID) | ORAL | Status: DC
  Administered 2011-05-22 – 2011-05-28 (×12): 300 mg via ORAL
  Filled 2011-05-22: qty 1
  Filled 2011-05-22: qty 14
  Filled 2011-05-22 (×4): qty 1
  Filled 2011-05-22: qty 14
  Filled 2011-05-22 (×9): qty 1

## 2011-05-22 NOTE — Progress Notes (Signed)
[facility] MD Progress Note  05/22/2011 4:47 PM  Patient was observed distressed with both hands to both sides of the head stating, "I have these thoughts of hurting other people and I just have to get these thoughts out of my head"   Diagnosis:   Axis I: Bipolar, Manic with rapid cycling Axis II: Deferred Axis III:  Past Medical History  Diagnosis Date  . Hypertension   . Asthma   . Sleep apnea   . Anxiety   . Depression   . Bipolar 1 disorder    Axis IV: No changes Axis V: 11-20 some danger of hurting self or others possible OR occasionally fails to maintain minimal personal hygiene OR gross impairment in communication  ADL's:  Impaired  Sleep: Poor  Appetite:  Poor  Suicidal Ideation:  Plan:  None Intent:  None Means:  None Homicidal Ideation: Yes Plan:  None Intent:  None Means:  None  AEB (as evidenced by): Per patient's reports.  Mental Status Examination/Evaluation: Objective:  Appearance: Disheveled  Eye Contact::  Poor  Speech:  Pressured  Volume:  Increased  Mood:  Anxious, Depressed and Irritable  Affect:  Blunt and Flat  Thought Process:  Irrational  Orientation:  Full  Thought Content:  Rumination  Suicidal Thoughts:  No  Homicidal Thoughts:  Yes.  without intent/plan  Memory:  Immediate;   Fair  Judgement:  Impaired  Insight:  Present  Psychomotor Activity:  Increased  Concentration:  Poor  Recall:  Fair  Akathisia:  No  Handed:  Right  AIMS (if indicated):     Assets:  Desire for Improvement  Sleep:  Number of Hours: 3.5    Vital Signs:Blood pressure 90/60, pulse 98, temperature 97.9 F (36.6 C), temperature source Oral, resp. rate 16, height 5' 3.78" (1.62 m), weight 102.967 kg (227 lb), SpO2 97.00%. Current Medications: Current Facility-Administered Medications  Medication Dose Route Frequency Provider Last Rate Last Dose  . acetaminophen (TYLENOL) tablet 650 mg  650 mg Oral Q6H PRN Sanjuana Kava, NP      . albuterol (PROVENTIL  HFA;VENTOLIN HFA) 108 (90 BASE) MCG/ACT inhaler 1 puff  1 puff Inhalation Q4H PRN Orson Aloe, MD      . alum & mag hydroxide-simeth (MAALOX/MYLANTA) 200-200-20 MG/5ML suspension 30 mL  30 mL Oral Q4H PRN Sanjuana Kava, NP      . benztropine (COGENTIN) tablet 1 mg  1 mg Oral BID Orson Aloe, MD   1 mg at 05/22/11 0824  . citalopram (CELEXA) tablet 40 mg  40 mg Oral Daily Orson Aloe, MD   40 mg at 05/22/11 0824  . docusate sodium (COLACE) capsule 100 mg  100 mg Oral Daily Sanjuana Kava, NP   100 mg at 05/22/11 0824  . feeding supplement (ENSURE CLINICAL STRENGTH) liquid 237 mL  237 mL Oral TID WC Orson Aloe, MD   237 mL at 05/22/11 0828  . gabapentin (NEURONTIN) tablet 600 mg  600 mg Oral TID Orson Aloe, MD   600 mg at 05/22/11 9528  . haloperidol (HALDOL) tablet 5 mg  5 mg Oral BID Orson Aloe, MD   5 mg at 05/22/11 0825  . haloperidol lactate (HALDOL) injection 5 mg  5 mg Intramuscular Q6H PRN Sanjuana Kava, NP   5 mg at 05/20/11 1740  . hydrOXYzine (ATARAX/VISTARIL) tablet 25 mg  25 mg Oral QHS PRN Orson Aloe, MD   25 mg at 05/22/11 0858  . lithium carbonate (LITHOBID) CR tablet 300  mg  300 mg Oral Q12H Sanjuana Kava, NP      . LORazepam (ATIVAN) injection 2 mg  2 mg Intramuscular Once Sanjuana Kava, NP   2 mg at 05/22/11 1100  . magnesium hydroxide (MILK OF MAGNESIA) suspension 30 mL  30 mL Oral Daily PRN Sanjuana Kava, NP      . megestrol (MEGACE) tablet 40 mg  40 mg Oral BID Orson Aloe, MD   40 mg at 05/22/11 1610  . naproxen (NAPROSYN) tablet 250 mg  250 mg Oral BID WC Orson Aloe, MD   250 mg at 05/22/11 0825  . pantoprazole (PROTONIX) EC tablet 40 mg  40 mg Oral Daily Orson Aloe, MD   40 mg at 05/22/11 0825  . prazosin (MINIPRESS) capsule 2 mg  2 mg Oral QHS PRN Orson Aloe, MD   2 mg at 05/22/11 0051  . prazosin (MINIPRESS) capsule 4 mg  4 mg Oral QHS Orson Aloe, MD   4 mg at 05/21/11 2230  . SUMAtriptan (IMITREX) tablet 100 mg  100 mg Oral Q2H PRN Orson Aloe, MD   100  mg at 05/19/11 1119  . DISCONTD: prazosin (MINIPRESS) capsule 2 mg  2 mg Oral QHS PRN,MR X 1 Orson Aloe, MD   2 mg at 05/20/11 2015    Lab Results: No results found for this or any previous visit (from the past 48 hour(s)).  Physical Findings: AIMS:  , ,  ,  ,    CIWA:    COWS:     Treatment Plan Summary: Daily contact with patient to assess and evaluate symptoms and progress in treatment Medication management  Plan:  Administer Haldol 5 mg Im as ordered prn for psychosis/agitations.           Lorazepam 2 mg IM x 1.           Add lithium carbonate 300 mg bid.           Escort patient to the padded quiet room for safety with 1:1 supervision.                        Armandina Stammer I 05/22/2011, 4:47 PM

## 2011-05-22 NOTE — Progress Notes (Signed)
Pt has a 1:1 at her side at all times. She was sitting on the floor and was hitting her head against the wall, not hard, but enough to cause herself pain. She was interrupted by a hand being placed behind her head, after she grabbed the pillow that was attempted to be placed behind her head. Pt. Was escorted to the quiet room, She was given an injection of Haldol 5 mg in her left glut at 0953. Remains on the 1:1 for her safety.

## 2011-05-22 NOTE — Progress Notes (Signed)
Pt. Has been resting peacefully since receiving the 2 mg of ativan with even unlabored breathing. Remains on a 1:1 for safety.

## 2011-05-22 NOTE — Progress Notes (Signed)
Pt unable to contract for safety for SI, pt remains on 1:1. Pt has been less guarded this evening, and has requested for this writer to provide information on her meds. Writer will print off sheets for pt to have at the bedside this morning. Pt is not attending groups, but is coming to the med room for med administration. This is progress for this pt, whom I had Thursday night. Writer has provided comfort and support to this pt, with additional offered as needed. Pt remains safe at this time.

## 2011-05-22 NOTE — Progress Notes (Signed)
Pt is resting in quiet room with 1:1 staff at bedside. Her RR is WNL, even and unlabored. Pt observed changing positions prn. 1:1 obs remains in place for pt safety. Pt is safe. [name] [name]

## 2011-05-22 NOTE — Progress Notes (Signed)
Pt was able to cooperative and lie on the bed in the quiet room, but continues to have bad thoughts going through her head. States " I have these thoughts of hurting other people and I just have to get these thoughts out of my head". Was given an IM injection of Ativan 2 mg by Shelda Jakes RN at this time. Pt asleep an hour later, lying in the quiet room with even unlabored breathing and accompanied by a staff member at all times.

## 2011-05-22 NOTE — Progress Notes (Signed)
[facility] Group Notes:  (Counselor/Nursing/MHT/Case Management/Adjunct)  05/22/2011 1315  Type of Therapy:  Group Therapy  Participation Level:  Did Not Attend  [name] [name] 05/22/2011, 3:10 PM

## 2011-05-23 MED ORDER — LORAZEPAM 2 MG/ML IJ SOLN
2.0000 mg | Freq: Once | INTRAMUSCULAR | Status: AC
  Administered 2011-05-23: 2 mg via INTRAMUSCULAR
  Filled 2011-05-23: qty 1

## 2011-05-23 NOTE — Progress Notes (Signed)
[facility] MD Progress Note  05/23/2011 9:01 PM  Patient in the quiet room with sister. She reports, "This thoughts in my head. It is telling me to kill people, shoot my family and kill my self. I want the thoughts to go away. I can't make it go away. I rather stay in this room. I can't go out there"  Diagnosis:   Axis I: Bipolar, Manic with rapid cycling. Axis II: Deferred Axis III:  Past Medical History  Diagnosis Date  . Hypertension   . Asthma   . Sleep apnea   . Anxiety   . Depression   . Bipolar 1 disorder    Axis IV: No changes Axis V: 11-20 some danger of hurting self or others possible OR occasionally fails to maintain minimal personal hygiene OR gross impairment in communication  ADL's:  Impaired  Sleep: Poor  Appetite:  Poor  Suicidal Ideation: "Yes" Plan:  Yes Intent:  None Means:  None Homicidal Ideation:  Plan:  Yes Intent:  None Means:  None  AEB (as evidenced by):  Mental Status Examination/Evaluation: Objective:  Appearance: Disheveled  Eye Contact::  Poor  Speech:  Pressured  Volume:  Increased  Mood:  Dysphoric  Affect:  Flat  Thought Process:  Disorganized  Orientation:  Full  Thought Content:  Hallucinations: Auditory  Suicidal Thoughts:  Yes.  without intent/plan  Homicidal Thoughts:  Yes.  without intent/plan  Memory:  Immediate;   Good  Judgement:  Impaired  Insight:  Present  Psychomotor Activity:  Restlessness  Concentration:  Poor  Recall:  Fair  Akathisia:  No  Handed:  Right  AIMS (if indicated):     Assets:  Desire for Improvement  Sleep:  Number of Hours: 3.5    Vital Signs:Blood pressure 90/60, pulse 98, temperature 97.9 F (36.6 C), temperature source Oral, resp. rate 16, height 5' 3.78" (1.62 m), weight 102.967 kg (227 lb), SpO2 97.00%. Current Medications: Current Facility-Administered Medications  Medication Dose Route Frequency Provider Last Rate Last Dose  . acetaminophen (TYLENOL) tablet 650 mg  650 mg Oral Q6H PRN  Sanjuana Kava, NP      . albuterol (PROVENTIL HFA;VENTOLIN HFA) 108 (90 BASE) MCG/ACT inhaler 1 puff  1 puff Inhalation Q4H PRN Orson Aloe, MD      . alum & mag hydroxide-simeth (MAALOX/MYLANTA) 200-200-20 MG/5ML suspension 30 mL  30 mL Oral Q4H PRN Sanjuana Kava, NP      . benztropine (COGENTIN) tablet 1 mg  1 mg Oral BID Orson Aloe, MD   1 mg at 05/23/11 1725  . citalopram (CELEXA) tablet 40 mg  40 mg Oral Daily Orson Aloe, MD   40 mg at 05/23/11 0742  . docusate sodium (COLACE) capsule 100 mg  100 mg Oral Daily Sanjuana Kava, NP   100 mg at 05/23/11 0742  . feeding supplement (ENSURE CLINICAL STRENGTH) liquid 237 mL  237 mL Oral TID WC Orson Aloe, MD   237 mL at 05/23/11 1723  . gabapentin (NEURONTIN) tablet 600 mg  600 mg Oral TID Orson Aloe, MD   600 mg at 05/23/11 1723  . haloperidol (HALDOL) tablet 5 mg  5 mg Oral BID Orson Aloe, MD   5 mg at 05/23/11 1724  . haloperidol lactate (HALDOL) injection 5 mg  5 mg Intramuscular Q6H PRN Sanjuana Kava, NP   5 mg at 05/23/11 1342  . hydrOXYzine (ATARAX/VISTARIL) tablet 25 mg  25 mg Oral QHS PRN Orson Aloe, MD  25 mg at 05/23/11 1343  . lithium carbonate (LITHOBID) CR tablet 300 mg  300 mg Oral Q12H Sanjuana Kava, NP   300 mg at 05/23/11 0742  . LORazepam (ATIVAN) injection 2 mg  2 mg Intramuscular Once Sanjuana Kava, NP   2 mg at 05/23/11 1603  . magnesium hydroxide (MILK OF MAGNESIA) suspension 30 mL  30 mL Oral Daily PRN Sanjuana Kava, NP      . megestrol (MEGACE) tablet 40 mg  40 mg Oral BID Orson Aloe, MD   40 mg at 05/23/11 1720  . naproxen (NAPROSYN) tablet 250 mg  250 mg Oral BID WC Orson Aloe, MD   250 mg at 05/23/11 1724  . pantoprazole (PROTONIX) EC tablet 40 mg  40 mg Oral Daily Orson Aloe, MD   40 mg at 05/23/11 0742  . prazosin (MINIPRESS) capsule 2 mg  2 mg Oral QHS PRN Orson Aloe, MD   2 mg at 05/22/11 0051  . prazosin (MINIPRESS) capsule 4 mg  4 mg Oral QHS Orson Aloe, MD   4 mg at 05/22/11 2304  .  SUMAtriptan (IMITREX) tablet 100 mg  100 mg Oral Q2H PRN Orson Aloe, MD   100 mg at 05/19/11 1119    Lab Results: No results found for this or any previous visit (from the past 48 hour(s)).  Physical Findings: AIMS:  , ,  ,  ,    CIWA:    COWS:     Treatment Plan Summary: Daily contact with patient to assess and evaluate symptoms and progress in treatment Medication management  Plan: Administer lorazepam 2 mg IM now.          Keep patient in the quiet room to avoid overstimulation.          Continue current treatment plan. Armandina Stammer I 05/23/2011, 9:01 PM

## 2011-05-23 NOTE — Progress Notes (Signed)
Pt stated that she was feeling very agitated again and needed something to calm her down.Pt was given Haldol 5 mg IM  and Vistaril 25 mg po at 1342. Talked with Pt about trying this  First before talking with the doctor. Pt said that the Ativan that she got yesterday really helped her deal with her bad intrusive thoughts.

## 2011-05-23 NOTE — Progress Notes (Signed)
Pt states that she is having terrible thoughts that are inside her head. States that she wants to kill her family and then kill herself. States she would do it by shooting them. Does not have access to a gun. Rocking back and forth, holding her head stating that the thoughts are overpowering. An order for Ativan 2 mg IM was order and given at this time.

## 2011-05-23 NOTE — Progress Notes (Signed)
Pt continues to rest quietly in the quiet room with MHT staff at bedside. Pt did get up to bathroom within the last hour. Her RR is WNL, even and unlabored. 1:1 obs continued for safety. Pt remains safe. Lawrence Marseilles

## 2011-05-23 NOTE — Progress Notes (Signed)
Pt awoke asking for medications. She was given scheduled hs meds as well as vistaril 25mg  prn and an Ensure. Pt was able to return to sleep. She remains safe with 1:1 in place. Lawrence Marseilles

## 2011-05-23 NOTE — Progress Notes (Signed)
Pt is sleeping in the quiet room after eating her dinner. Breathing is even and unlabored

## 2011-05-23 NOTE — Progress Notes (Signed)
Pt is resting quietly with eyes closed in the quiet room. Resps are even and unlabored. No distress observed. No complaints voiced. 1:1 staff at bedside for safety. Pt is safe. Lawrence Marseilles

## 2011-05-23 NOTE — Progress Notes (Signed)
Pt has been resting comfortably in the quiet room. States that she is feeling better than yesterday but continues to have the bad thoughts, just not as intense. Continues to think about killing her family and herself. Remains on a 1:1 for her safety and the safety of others.

## 2011-05-23 NOTE — Progress Notes (Signed)
[facility] Group Notes:  (Counselor/Nursing/MHT/Case Management/Adjunct)  05/23/2011 1315  Type of Therapy:  Group Therapy  Participation Level:  Did Not Attend  [name] [name] 05/23/2011, 2:28 PM 

## 2011-05-24 NOTE — Progress Notes (Addendum)
Patient continues with 1:1 present.   Patient moved from quiet room to her room and is presently laying in bed.   Safety maintained.  On self inventory sheet, patient sleeps well, has poor appetite, low energy level, improving attention span.   Rated depression and hopelessness #10.  Denied withdrawals.  SI thoughts.  1:1 present.  Physical problems in past 24 hours, lightheaded, dizziness, headaches.  Zero pain goal.  Plans to make changes by having ECT.  No questions for staff.   No discharge plans.   No problems taking meds after discharge. 1:1 continues per MD order.  Safety maintained.   Will continue to monitor patient for safety. 1230  Patient ate 50% of her lunch which was the hoagie sandwich.   Patient has talked to family on the phone in the dayroom.  Patient took her 1200 medications without difficulty.  1:1 continues per MD order.   Safety maintained.   Patient presently in her room, in bed, 1:1 continues per MD order.   Respirations even and unlabored.   No signs of pain/distress noted on patient's face or body movements.   1530  Patient is safe in bed, closed eyes.  1:1 present per MD order.  Respirations even and unlabored.   No signs of pain or distress noted on patient's face or body movements.  1:1 will continue for safety.   At 1342 Haldol 5 mg IM injection given per patient's request because she was asked to go to group, patient declined group, became upset, and requested medication injection which was given to her.  Patient resting comfortably in bed presently with 1:1 present.

## 2011-05-24 NOTE — Tx Team (Signed)
Interdisciplinary Treatment Plan Update (Adult)  Date:  05/24/2011  Time Reviewed:  10:34 AM   Progress in Treatment: Attending groups: No, not present in any groups over weekend Participating in groups:  No Taking medication as prescribed:  Yes Tolerating medication: Yes Family/Significant othe contact made:  No, has not given consent to contact Patient understands diagnosis: Yes Discussing patient identified problems/goals with staff:  Yes Medical problems stabilized or resolved: Yes Denies suicidal/homicidal ideation: No, talking about killing her family and herself  Issues/concerns per patient self-inventory:  No  Other:  New problem(s) identified: None  Reason for Continuation of Hospitalization: Depression Homicidal ideation Medication stabilization Suicidal ideation  Interventions implemented related to continuation of hospitalization:  Medication stabilization, safety checks q 15 mins, group attendance  Additional comments: awaiting transfer (possibility of a bed mid-week)  Estimated length of stay: 2-3 days  Discharge Plan: hopeful to transfer to [facility] or [facility] for ECT  New goal(s):  Review of initial/current patient goals per problem list:    1. Goal(s): Reduce depression from 10 at admission to no more than 3 at discharge.  Met: No  Target date: By Discharge  As evidenced by: [name] [name] is rating depression at 10 today  2. Goal(s): Deny SI for 48 hours prior to discharge.  Met: No  Target date: By Discharge  As evidenced by: [name] [name] continues to be suicidal, now endorsing thoughts about killing family as well  3. Goal(s): Reduce anxiety from 10 at admission to no more than 3 at discharge.  Met: No  Target date: By Discharge  As evidenced by: [name] [name] is rating depression at 10 today  4. Goal(s): Determine best course of action for repeated hospitalization.  Met: Yes  Target date: By Discharge  As evidenced by: Patient believes will benefit from continued  ECT - being referred to hospital which does this.  5. Goal(s): Transfer to another hospital for ECT  Met: No  Target date: By Discharge  As evidenced by: Calls made daily  6. Goal(s): Get off 1:1 - No inappropriate behaviors.  Met: No  Target date: By Discharge  As evidenced by: Patient is on 1:1and has been spending weekend sleeping in quiet room   Attendees: Patient:   05/24/2011 10:34 AM  Family:   05/24/2011 10:34 AM  Physician:  Dr Orson Aloe, MD 05/24/2011 10:34 AM  Nursing:   Nanine Means, RN 05/24/2011 10:34 AM  Case Manager:  Juline Patch, LCSW 05/24/2011 10:34 AM  Counselor:  Angus Palms, LCSW 05/24/2011 10:34 AM  Other:  Reyes Ivan, LCSWA 05/24/2011 10:34 AM  Other:  Charlyne Mom, doctoral psych intern 05/24/2011 10:34 AM  Other:   05/24/2011 10:34 AM  Other:   05/24/2011 10:34 AM   Scribe for Treatment Team:   [name], 05/24/2011 10:34 AM

## 2011-05-24 NOTE — Progress Notes (Addendum)
0830   Patient laying on right side on mattress in quiet room.   Woke patient to give her morning medications which she took without difficulty.  Patient drank 100% of ensure this morning.  Patient stated she did not need to go to bathroom now.   Stated she did not want any food to eat now or drink.  Patient was calm and cooperative.   Patient laid back down on mattress after medications.  1:1 present per MD order.  Safety maintained with 1:1 which will continue.   Will continue to monitor for safety.   4098  Patient continues to sleep in quiet room, laying on mattress, 1:1 continues per MD order.   Safety maintained and will continue to monitor with 1:1.

## 2011-05-24 NOTE — Progress Notes (Signed)
Pt awakened for vital signs, morning meds. Pt remains sleepy but is cooperative and oriented. She is reluctant to leave QR and asks that meds be brought to her. Pt refusing breakfast but will drink all of Ensure. Medicated without difficulty. Encouraged pt to get up and out of QR today, even if for a short time, and to complete hygiene. Also encouraged to push fluids. Pt somewhat receptive. Lawrence Marseilles

## 2011-05-24 NOTE — Progress Notes (Signed)
Pt continues to rest in quiet room. RR WNL, even and unlabored. 1:1 staff at bedside. Pt remains safe on 1:1 obs. [name] [name]

## 2011-05-24 NOTE — Progress Notes (Signed)
Pt continues to rest quietly with eyes closed. RR even and unlabored. Asked 1:1 MHT to notify this writer should pt awake so that vs can be obtained as well as meds given. Will also plan to offer fluids at that time. 1:1 obs continued for safety. Pt is safe. Lawrence Marseilles

## 2011-05-24 NOTE — Progress Notes (Signed)
1830   Patient ate dinner in her room.  1:1 continues per MD order.   Patient has rested this afternoon.  No complaints of pain or discomfort.   Has talked on phone this afternoon.   1:1 continues, safety maintained.

## 2011-05-24 NOTE — Progress Notes (Signed)
[facility] Group Notes:  (Counselor/Nursing/MHT/Case Management/Adjunct)    Type of Therapy:  Group Therapy  Participation Level:  Did Not Attend       [name] [name] 05/24/2011  2:50 PM

## 2011-05-24 NOTE — Progress Notes (Signed)
Pt continues to sleep soundly with even and unlabored respirations. Light audible snoring overheard. She adjusts position at times but has not awoken for any needs. 1:1 obs maintained at the bedside. Pt is safe. Lawrence Marseilles

## 2011-05-24 NOTE — Progress Notes (Signed)
Patient has been in room for majority of evening today, asleep or resting. Did not attend evening group. No behavioral issues have been noted this evening. Patient had visitor this evening, aunt. Patient came up to 500 medication room around 1945 and requested pitcher of ice water, patient was smiling and looked brighter. Continues to be on 1:1 to ensure patient safety, patient remains safe.

## 2011-05-25 MED ORDER — LUBIPROSTONE 8 MCG PO CAPS
8.0000 ug | ORAL_CAPSULE | Freq: Two times a day (BID) | ORAL | Status: DC
  Administered 2011-05-25 – 2011-05-28 (×8): 8 ug via ORAL
  Filled 2011-05-25 (×4): qty 1
  Filled 2011-05-25: qty 10
  Filled 2011-05-25 (×5): qty 1
  Filled 2011-05-25: qty 10

## 2011-05-25 NOTE — Progress Notes (Signed)
[facility] Group Notes:  (Counselor/Nursing/MHT/Case Management/Adjunct)    Type of Therapy:  Group Therapy  Participation Level:  Did Not Attend; Dr [name] has written [name] out of attending groups.   [name] 05/25/2011  3:14 PM

## 2011-05-25 NOTE — Progress Notes (Addendum)
0830  Patient has 1:1 present.  Patient had been resting in bed with eyes closed.  Patient sat up in bed to take morning meds.  No signs of pain or distress noted on patient's face or body movements.  No complaints of pain or discomfort.  Will continue to monitor for safety per MD order.  1:1 continues.   1230  Patient is in bed resting.   Does have thoughts of hurting self and others.   Clinical research associate for safety.   Wants to stay in room today.  1:1 continues for safety per MD order.   1545   Patient presently in bed with eyes opened.  1:1 continues per MD order.   Patient stated she does have thoughts of SI and HI.  Clinical research associate for safety.  Denied A/V hallucinations.   Denied pain.  Respirations even and unlabored.  No signs of pain or discomfort noted on patient's face or body movements.   1:1 to continue per MD order for safety. 1630  On self inventory form, patient sleeps well, has poor appetite, low energy level.  Rated depression and hopelessness as #10.  SI yes, cannot contract for safety.   In past 24 hours, have experienced dizziness, headaches.  No pain goal, worst pain #4.  Plans to have ECT.  No questions for staff.  No problems staying on meds after discharge. 1800  Pt took medications without difficulty.   Sat in bed and ate her dinner.   Stated she still has thoughts of SI, will not contract for safety.   1:1 continues per MD order.  Safety maintained and will continue to monitor.  Denied pain.   Respirations even and unlabored.   No signs/symptoms of distress noted on patient's face or body movements.

## 2011-05-25 NOTE — Progress Notes (Signed)
[facility] MD Progress Note  05/25/2011 8:39 PM  Diagnosis:  Axis I: Major Depression, Recurrent severe Axis II: Borderline Personality Dis.  ADL's:  Impaired  Sleep: Good  Appetite:  Good  Suicidal Ideation:  Intent:  Fights the intention all day long Homicidal Ideation:  Intent:  Fights the intent all day long  AEB (as evidenced by):  Mental Status Examination/Evaluation: Objective:  Appearance: Disheveled  Eye Contact::  Fair  Speech:  Clear and Coherent  Volume:  Normal  Mood:  Depressed10/10 on scale of 1 is the best and 10 is the worst Anxiety: 5/10 on same scale.  Affect:  Congruent  Thought Process:  Coherent  Orientation:  Full  Thought Content:  WDL  Suicidal Thoughts:  Yes.  with intent/plan  Homicidal Thoughts:  Yes.  with intent/plan  Memory:  Immediate;   Good  Judgement:  Fair  Insight:  Fair  Psychomotor Activity:  Decreased  Concentration:  Fair  Recall:  Fair  Akathisia:  No  AIMS (if indicated):     Assets:  Communication Skills Desire for Improvement  Sleep:  Number of Hours: 5.5    Vital Signs:Blood pressure 110/70, pulse 94, temperature 99.2 F (37.3 C), temperature source Oral, resp. rate 20, height 5' 3.78" (1.62 m), weight 102.967 kg (227 lb), SpO2 97.00%. Current Medications: Current Facility-Administered Medications  Medication Dose Route Frequency Provider Last Rate Last Dose  . acetaminophen (TYLENOL) tablet 650 mg  650 mg Oral Q6H PRN Sanjuana Kava, NP      . albuterol (PROVENTIL HFA;VENTOLIN HFA) 108 (90 BASE) MCG/ACT inhaler 1 puff  1 puff Inhalation Q4H PRN Orson Aloe, MD      . alum & mag hydroxide-simeth (MAALOX/MYLANTA) 200-200-20 MG/5ML suspension 30 mL  30 mL Oral Q4H PRN Sanjuana Kava, NP      . benztropine (COGENTIN) tablet 1 mg  1 mg Oral BID Orson Aloe, MD   1 mg at 05/25/11 1743  . citalopram (CELEXA) tablet 40 mg  40 mg Oral Daily Orson Aloe, MD   40 mg at 05/25/11 1610  . docusate sodium (COLACE) capsule 100 mg  100 mg  Oral Daily Sanjuana Kava, NP   100 mg at 05/25/11 9604  . feeding supplement (ENSURE CLINICAL STRENGTH) liquid 237 mL  237 mL Oral TID WC Orson Aloe, MD   237 mL at 05/25/11 1742  . gabapentin (NEURONTIN) tablet 600 mg  600 mg Oral TID Orson Aloe, MD   600 mg at 05/25/11 1758  . haloperidol (HALDOL) tablet 5 mg  5 mg Oral BID Orson Aloe, MD   5 mg at 05/25/11 1745  . haloperidol lactate (HALDOL) injection 5 mg  5 mg Intramuscular Q6H PRN Sanjuana Kava, NP   5 mg at 05/24/11 1342  . hydrOXYzine (ATARAX/VISTARIL) tablet 25 mg  25 mg Oral QHS PRN Orson Aloe, MD   25 mg at 05/24/11 2202  . lithium carbonate (LITHOBID) CR tablet 300 mg  300 mg Oral Q12H Sanjuana Kava, NP   300 mg at 05/25/11 2017  . lubiprostone (AMITIZA) capsule 8 mcg  8 mcg Oral BID WC Orson Aloe, MD   8 mcg at 05/25/11 1759  . magnesium hydroxide (MILK OF MAGNESIA) suspension 30 mL  30 mL Oral Daily PRN Sanjuana Kava, NP      . megestrol (MEGACE) tablet 40 mg  40 mg Oral BID Orson Aloe, MD   40 mg at 05/25/11 1746  . naproxen (NAPROSYN) tablet 250  mg  250 mg Oral BID WC Orson Aloe, MD   250 mg at 05/25/11 1746  . pantoprazole (PROTONIX) EC tablet 40 mg  40 mg Oral Daily Orson Aloe, MD   40 mg at 05/25/11 9604  . prazosin (MINIPRESS) capsule 2 mg  2 mg Oral QHS PRN Orson Aloe, MD   2 mg at 05/22/11 0051  . prazosin (MINIPRESS) capsule 4 mg  4 mg Oral QHS Orson Aloe, MD   4 mg at 05/24/11 2201  . SUMAtriptan (IMITREX) tablet 100 mg  100 mg Oral Q2H PRN Orson Aloe, MD   100 mg at 05/19/11 1119    Lab Results: No results found for this or any previous visit (from the past 48 hour(s)).  Physical Findings: AIMS:  , ,  ,  ,    CIWA:    COWS:    She has noted no benefit to the Amitiza yet.  She has slept better and had a better appetite, which may relate to her anxiety being a little better.   She thinks about suicide every time she closes her eyes, but she has discovered that if she just keeps her eyes closed  that the suicide thoughts go away SOMETIMES. She thinks about hurting others whenever someone comes into her room and asks her questions. She can not contract for safety, so will continue 1:1.  Treatment Plan Summary: Daily contact with patient to assess and evaluate symptoms and progress in treatment Medication management  Plan: Continue 1:1 and follow-up on her referral to [facility] [facility] [place], Halo Shevlin 05/25/2011, 8:39 PM

## 2011-05-25 NOTE — Progress Notes (Signed)
Writer met with patient who did not attend d/c planning group.  She continues to endorse depression and being unable to contract for safety.  Writer spoke with Junious Dresser at [facility] who advised it may be the first of the week before a female bed is available.

## 2011-05-25 NOTE — Progress Notes (Signed)
Awakened for 06:00 medication.  Responds "OK I guess"  when asked about sleep and rest during night.  Says still unable to contract for safety.  No self injurious behaviors during night.  Remains on constant 1:1 monitoring and observation for safety.

## 2011-05-25 NOTE — Progress Notes (Signed)
Patient ID: [name] [name], female   DOB: May 11, 1965, 46 y.o.   MRN: [id number] Pt seen in her room with sitter present.  She was laying face down, arms crossed under her head.  She was easy to arouse and actually came to a seated position pretty soon after I began talking to her.  She reported that she slept better last night than before.  There have been no med changes, but she just slept better.  Down the hall another pt had reported having torouble getting to sleep because someone had an upsetting momnet just outside the door real close to her door.  Perhaps the patient is doing a little better.  SHe is on the fast track for admission to [facility] for sometime this week.  This was a relief for the patient.  She requested that being around a lot of people scares her and could she be excused form going to groups. She related her history clearly that the ECT helps and most meds and groups do not until she gets restarted on her ECT.  Will excuse her from groups for this hospital stay. She describes chronic constipation releived only with Amitiza.  Will research that with the pharmacist. She does not feel safe without the 1:1, so will continue that.

## 2011-05-25 NOTE — Progress Notes (Signed)
Lying quietly in bed with eyes closed.  No physical complaints or behavioral problems.  Exhibiting normal sleep behavior.  Remains of 1:1 constant monitoring and observation.

## 2011-05-26 MED ORDER — PRAZOSIN HCL 1 MG PO CAPS
5.0000 mg | ORAL_CAPSULE | Freq: Every day | ORAL | Status: DC
  Administered 2011-05-26 – 2011-05-27 (×2): 5 mg via ORAL
  Filled 2011-05-26 (×4): qty 5

## 2011-05-26 NOTE — Progress Notes (Signed)
[facility] Group Notes:  (Counselor/Nursing/MHT/Case Management/Adjunct)  05/26/2011 3:33 PM  Type of Therapy:  1:15PM Group Therapy  Participation Level:  Did Not Attend  [name] [name] 05/26/2011, 3:33 PM  Cosigned by: Angus Palms, LCSW

## 2011-05-26 NOTE — Progress Notes (Signed)
Patient ID: [name] [name], female   DOB: June 03, 1965, 46 y.o.   MRN: [id number] [facility] MD Progress Note  05/26/2011 3:06 PM  Diagnosis:  Axis I: Major Depression, Recurrent severe Axis II: Borderline Personality Dis.  ADL's:  Impaired  Sleep: Fair  Appetite:  Good, improving on the Megace  Suicidal Ideation:  Intent:  Fights the intention all day long Homicidal Ideation:  Intent:  Fights the intent all day long  Mental Status Examination/Evaluation: Objective:  Appearance: Disheveled  Patent attorney::  Fair  Speech:  Clear and Coherent  Volume:  Normal  Mood:  Depressed10/10 on scale of 1 is the best and 10 is the worst Anxiety: 6/10 on same scale, elevated by her having to go to do laundry and stand in medication line with others around.  Affect:  Congruent  Thought Process:  Coherent  Orientation:  Full  Thought Content:  WDL  Suicidal Thoughts:  Yes.  with intent/plan  Homicidal Thoughts:  Yes.  with intent/plan  Memory:  Immediate;   Good  Judgement:  Fair  Insight:  Fair  Psychomotor Activity:  Decreased  Concentration:  Fair  Recall:  Fair  Akathisia:  No  AIMS (if indicated):     Assets:  Communication Skills Desire for Improvement  Sleep:  Number of Hours: 4.5    Vital Signs:Blood pressure 112/77, pulse 104, temperature 97.1 F (36.2 C), temperature source Oral, resp. rate 20, height 5' 3.78" (1.62 m), weight 102.967 kg (227 lb), SpO2 97.00%. Current Medications: Current Facility-Administered Medications  Medication Dose Route Frequency Provider Last Rate Last Dose  . acetaminophen (TYLENOL) tablet 650 mg  650 mg Oral Q6H PRN Sanjuana Kava, NP      . albuterol (PROVENTIL HFA;VENTOLIN HFA) 108 (90 BASE) MCG/ACT inhaler 1 puff  1 puff Inhalation Q4H PRN Orson Aloe, MD      . alum & mag hydroxide-simeth (MAALOX/MYLANTA) 200-200-20 MG/5ML suspension 30 mL  30 mL Oral Q4H PRN Sanjuana Kava, NP      . benztropine (COGENTIN) tablet 1 mg  1 mg Oral BID Orson Aloe, MD    1 mg at 05/26/11 0831  . citalopram (CELEXA) tablet 40 mg  40 mg Oral Daily Orson Aloe, MD   40 mg at 05/26/11 0831  . docusate sodium (COLACE) capsule 100 mg  100 mg Oral Daily Sanjuana Kava, NP   100 mg at 05/26/11 0831  . feeding supplement (ENSURE CLINICAL STRENGTH) liquid 237 mL  237 mL Oral TID WC Orson Aloe, MD   237 mL at 05/26/11 1215  . gabapentin (NEURONTIN) tablet 600 mg  600 mg Oral TID Orson Aloe, MD   600 mg at 05/26/11 1215  . haloperidol (HALDOL) tablet 5 mg  5 mg Oral BID Orson Aloe, MD   5 mg at 05/26/11 0831  . haloperidol lactate (HALDOL) injection 5 mg  5 mg Intramuscular Q6H PRN Sanjuana Kava, NP   5 mg at 05/24/11 1342  . hydrOXYzine (ATARAX/VISTARIL) tablet 25 mg  25 mg Oral QHS PRN Orson Aloe, MD   25 mg at 05/25/11 2216  . lithium carbonate (LITHOBID) CR tablet 300 mg  300 mg Oral Q12H Sanjuana Kava, NP   300 mg at 05/26/11 4098  . lubiprostone (AMITIZA) capsule 8 mcg  8 mcg Oral BID WC Orson Aloe, MD   8 mcg at 05/26/11 0831  . magnesium hydroxide (MILK OF MAGNESIA) suspension 30 mL  30 mL Oral Daily PRN Sanjuana Kava, NP      .  megestrol (MEGACE) tablet 40 mg  40 mg Oral BID Orson Aloe, MD   40 mg at 05/26/11 0630  . naproxen (NAPROSYN) tablet 250 mg  250 mg Oral BID WC Orson Aloe, MD   250 mg at 05/26/11 0831  . pantoprazole (PROTONIX) EC tablet 40 mg  40 mg Oral Daily Orson Aloe, MD   40 mg at 05/26/11 0454  . prazosin (MINIPRESS) capsule 2 mg  2 mg Oral QHS PRN Orson Aloe, MD   2 mg at 05/26/11 0101  . prazosin (MINIPRESS) capsule 5 mg  5 mg Oral QHS Orson Aloe, MD      . SUMAtriptan [facility]) tablet 100 mg  100 mg Oral Q2H PRN Orson Aloe, MD   100 mg at 05/19/11 1119  . DISCONTD: prazosin (MINIPRESS) capsule 4 mg  4 mg Oral QHS Orson Aloe, MD   4 mg at 05/25/11 2216    Lab Results: No results found for this or any previous visit (from the past 48 hour(s)).  Physical Findings: AIMS:  , ,  ,  ,    CIWA:    COWS:    She has noted no  benefit to the Amitiza yet.  She has a better appetite with the Megace.  She didn't sleep as well last night.  Her anxiety is elevated by by having to go and do laundry and having to stand in line for getting her medications. She thinks about suicide all day long. She thinks about hurting others whenever someone comes into her room and asks her questions. She can not contract for safety, so will continue 1:1.  Treatment Plan Summary: Daily contact with patient to assess and evaluate symptoms and progress in treatment Medication management  Plan: Continue 1:1 and follow-up on her referral to [facility] [place], Tyriana Helmkamp 05/26/2011, 3:06 PM

## 2011-05-26 NOTE — Progress Notes (Signed)
Patient ID: [name] [name], female   DOB: 16-Jul-1965, 47 y.o.   MRN: [id number] Patient has been in bed for majority of the evening. Scheduled 2000 and 2200 medications were brought to room because patient was resting. Patient did get up to use phone once this evening. At 0100, patient came to medication window requesting PRN dose of minipress, that was given, to aide with insomnia. Patient minimal in interaction, speech soft and slow. No behavioral issues observed this evening.

## 2011-05-26 NOTE — Progress Notes (Signed)
Patient with 1:1 sitter .  Safety maintained. Had suicidal ideation  Per  Patient   During interaction. Reassured patient , med given.  Continue monitoring .

## 2011-05-26 NOTE — Progress Notes (Addendum)
[facility] Group Notes:  (Counselor/Nursing/MHT/Case Management/Adjunct)  05/26/2011 1:02 PM  Type of Therapy:  Group Therapy  Participation Level:  Did Not Attend  [name] [name] 05/26/2011, 1:02 PM  Cosigned by: Angus Palms, LCSW

## 2011-05-26 NOTE — Progress Notes (Signed)
Pt rates depression and hopelessness at a 10. Pt does not attend groups and doctor has written an order to not bother pt and allow her to stay in her room and to herself. Pt was offered support and encouragement. Pt safety maintained on unit.

## 2011-05-27 MED ORDER — CARBAMAZEPINE 200 MG PO TABS
200.0000 mg | ORAL_TABLET | Freq: Three times a day (TID) | ORAL | Status: DC
  Administered 2011-05-27 – 2011-05-28 (×4): 200 mg via ORAL
  Filled 2011-05-27: qty 21
  Filled 2011-05-27: qty 1
  Filled 2011-05-27: qty 21
  Filled 2011-05-27 (×2): qty 1
  Filled 2011-05-27: qty 21
  Filled 2011-05-27 (×2): qty 1

## 2011-05-27 NOTE — Progress Notes (Signed)
Patient seen to assess for needs.  She continues to endorse SI and states she wants to go to either [facility] or [facility] for ECT.  She was informed that Clinical research associate spoke with Junious Dresser who reports no females have discharged and they hope to have a bed by the first for of the week.  Writer also spoke with Archie Patten at [facility] and was advised to call back on 05/31/11 as they do not admit over the weekend and patient would need to be in their facility 24 hours before the next ECT treatment day which would be on Wednesday if patient admits next week.

## 2011-05-27 NOTE — Tx Team (Signed)
Interdisciplinary Treatment Plan Update (Adult)  Date:  05/27/2011  Time Reviewed:  10:41 AM   Progress in Treatment: Attending groups: No, has been ordered by doctor that she not attend groups due to propensity for violence toward others  Participating in groups:  No - see above Taking medication as prescribed:  Yes Tolerating medication: Yes Family/Significant othe contact made:  No, requesting consent to contact family Patient understands diagnosis: Yes Discussing patient identified problems/goals with staff:  Yes Medical problems stabilized or resolved: Yes Denies suicidal/homicidal ideation: No, remains suicidal  Issues/concerns per patient self-inventory:  No  Other:  New problem(s) identified: None  Reason for Continuation of Hospitalization: Depression Homicidal ideation Medication stabilization Suicidal ideation  Interventions implemented related to continuation of hospitalization:  Medication stabilization, safety checks q 15 mins, group attendance  Additional comments: Per [facility] personnel, there will not be a female bed available until early next week  Estimated length of stay: 3-4 days  Discharge Plan: Discharge to [facility] or [facility] for ECT treatment  New goal(s):  Review of initial/current patient goals per problem list:    1. Goal(s): Reduce depression from 10 at admission to no more than 3 at discharge.  Met: No  Target date: By Discharge  As evidenced by: Rosey Bath continues to rate depression at 10 today  2. Goal(s): Deny SI for 48 hours prior to discharge.  Met: No  Target date: By Discharge  As evidenced by: Verner is still having suicidal thoughts and thoughts of killing her children as well so they will not have to live with her suicide 3. Goal(s): Reduce anxiety from 10 at admission to no more than 3 at discharge.  Met: No  Target date: By Discharge  As evidenced by: Rosey Bath is rating anxiety at 10 today  4. Goal(s): Determine best  course of action for repeated hospitalization.  Met: Yes  Target date: By Discharge  As evidenced by: Patient believes will benefit from continued ECT - being referred to hospital which does this.  5. Goal(s): Transfer to another hospital for ECT  Met: No  Target date: By Discharge  As evidenced by: Calls made daily, no bed available yet 6. Goal(s): Get off 1:1 - No inappropriate behaviors.  Met: No  Target date: By Discharge  As evidenced by: Patient is on 1:1and has expressed extreme agitation/anger     Attendees: Patient:     Family:     Physician:    Nursing:   Quintella Reichert, RN 05/27/2011 10:41 AM  Case Manager:  Juline Patch, LCSW 05/27/2011 10:41 AM  Counselor:  Angus Palms, LCSW 05/27/2011 10:41 AM  Other:  Reyes Ivan, LCSWA 05/27/2011 10:41 AM  Other:  Serena Colonel, NP 05/27/2011 10:41 AM  Other:  Trinda Pascal, NP 05/27/2011 10:41 AM  Other:  Wilmon Arms, counseling intern 05/27/2011 10:41 AM   Scribe for Treatment Team:   [name], 05/27/2011 10:41 AM

## 2011-05-27 NOTE — Progress Notes (Addendum)
Patient ID: [name] [name], female   DOB: 1965-08-13, 46 y.o.   MRN: [id number]    Patient has 1:1 present for safety.   Safety maintained.  Patient came to med window for morning meds.  Patient has been calm and cooperative.  Was laying in bed with eyes open.  1:1 present for safety.  Patient stated she does have SI thoughts which were worst yesterday.   Clinical research associate for safety.   Denied HI.  Stated she does not know how she can hurt herself while in [facility].   Again, stated she cannot contract for safety.  Patient did not eat breakfast, does not want to participate in activities/groups, wants to stay in her room by herself.   1:1 continues per MD order.   Safety maintained.   Stated she had BM this morning.   Will continue 1:1 per MD for safety.   1100   On self inventory sheet, patient sleeps fair, has poor appetite, low energy level, improving attention span.   Rated depression and hopelessness #10.  Has SI thought, will not contract for safety, 1:1 present.  Worst pain #1.   Plans to have ECT after discharge.   No questions for staff.  Does not have discharge plans.  No problems taking meds after discharge. 1230   Patient came to med window for medication and ensure.  1:1 continues.   Patient cannot contract for SI.   Ate her lunch while in her room.   Respirations even and unlabored.   No signs or pain/distress noted on patient's face or body movements.  Denied pain.  Patient's safety has been maintained with 1:1 present per MD order.   Will continue to monitor patient for safety with 1:1. 1600    Patient continues to rest in bed with eyes open/closing.  No signs of pain or distress noted.  1:1 continues per MD order.  Ate approximately 50% lunch in her room.  Has been up to bathroom this afternoon.   !:1 has maintained safety and will continue per MD order. 1830   Patient in bed asleep, eyes closed with 1:1 present.  No signs of pain/distress noted on patient's face or body movements.  Respirations  even and unlabored.   Ate approximately 60% of her dinner in her room.  1:1 will continue for safety.   Patient has been cooperative today.

## 2011-05-27 NOTE — Progress Notes (Signed)
Patient ID: [name] [name], female   DOB: 1966/02/16, 46 y.o.   MRN: [id number] Pt seen in her room with the 1:1 attendant waiting outside the door.  Pt was seated up in her bed.  She reported that the Minipress 5 mg helped her sleep just fine and she noted no nightmares.  This was a much appreciated change for her.   She asks that I prescribe for her a PO form of the Ativan as she notes that it helps her quite a bit.  i inquired if she had had any head injuries and she is unable to recall.  The notion of abnormal electrical activity may be contributing to her obsessive suicidal thoughts was mentioned to her.  i purposed to try her on Tegretol for that to see if that would be a non addictive replication to the benefit that she had noted w the Ativan.  The pt was agreeable to a trial of this.  She was told that it will have to be tapered before she could undergo the ECT again.  She was okay w that.

## 2011-05-27 NOTE — Progress Notes (Signed)
[facility] Group Notes:  (Counselor/Nursing/MHT/Case Management/Adjunct)  05/27/2011 3:09 PM  Type of Therapy:  1:15PM Group Therapy  Participation Level:  Did Not Attend  Wilmon Arms 05/27/2011, 3:09 PM  Cosigned by: Angus Palms, LCSW

## 2011-05-27 NOTE — Progress Notes (Signed)
[facility] MD Progress Note  05/27/2011 4:50 PM  "I am doing ok. I am still hearing voices. It is ongoing".   Diagnosis:   Axis I: Bipolar 1 disorder with rapid cycling. Axis II: Deferred Axis III:  Past Medical History  Diagnosis Date  . Hypertension   . Asthma   . Sleep apnea   . Anxiety   . Depression   . Bipolar 1 disorder    Axis IV: familial stressors Axis V: 21-30 behavior considerably influenced by delusions or hallucinations OR serious impairment in judgment, communication OR inability to function in almost all areas  ADL's:  Fair  Sleep: Good  Appetite:  Fair  Suicidal Ideation: "of and on" Plan:  No Intent:  No Means:  No Homicidal Ideation: "of and on" Plan:  No Intent:  No Means:  No  AEB (as evidenced by): Per patient's reports  Mental Status Examination/Evaluation: Objective:  Appearance: Casual  Eye Contact::  Fair  Speech:  Clear and Coherent  Volume:  Normal  Mood:  Depressed  Affect:  Flat  Thought Process:  Disorganized  Orientation:  Full  Thought Content:  Hallucinations: Auditory  Suicidal Thoughts:  Yes.  without intent/plan  Homicidal Thoughts:  Yes.  without intent/plan  Memory:  Immediate;   Good  Judgement:  Impaired  Insight:  Fair  Psychomotor Activity:  Normal  Concentration:  Fair  Recall:  Fair  Akathisia:  No  Handed:  Right  AIMS (if indicated):     Assets:  Desire for Improvement  Sleep:  Number of Hours: 6.75    Vital Signs:Blood pressure 116/75, pulse 88, temperature 98.3 F (36.8 C), temperature source Oral, resp. rate 20, height 5' 3.78" (1.62 m), weight 102.967 kg (227 lb), SpO2 99.00%. Current Medications: Current Facility-Administered Medications  Medication Dose Route Frequency Provider Last Rate Last Dose  . acetaminophen (TYLENOL) tablet 650 mg  650 mg Oral Q6H PRN Sanjuana Kava, NP      . albuterol (PROVENTIL HFA;VENTOLIN HFA) 108 (90 BASE) MCG/ACT inhaler 1 puff  1 puff Inhalation Q4H PRN Orson Aloe, MD       . alum & mag hydroxide-simeth (MAALOX/MYLANTA) 200-200-20 MG/5ML suspension 30 mL  30 mL Oral Q4H PRN Sanjuana Kava, NP      . benztropine (COGENTIN) tablet 1 mg  1 mg Oral BID Orson Aloe, MD   1 mg at 05/27/11 0845  . citalopram (CELEXA) tablet 40 mg  40 mg Oral Daily Orson Aloe, MD   40 mg at 05/27/11 0845  . docusate sodium (COLACE) capsule 100 mg  100 mg Oral Daily Sanjuana Kava, NP   100 mg at 05/27/11 0845  . feeding supplement (ENSURE CLINICAL STRENGTH) liquid 237 mL  237 mL Oral TID WC Orson Aloe, MD   237 mL at 05/27/11 1241  . gabapentin (NEURONTIN) tablet 600 mg  600 mg Oral TID Orson Aloe, MD   600 mg at 05/27/11 1240  . haloperidol (HALDOL) tablet 5 mg  5 mg Oral BID Orson Aloe, MD   5 mg at 05/27/11 0846  . haloperidol lactate (HALDOL) injection 5 mg  5 mg Intramuscular Q6H PRN Sanjuana Kava, NP   5 mg at 05/24/11 1342  . hydrOXYzine (ATARAX/VISTARIL) tablet 25 mg  25 mg Oral QHS PRN Orson Aloe, MD   25 mg at 05/26/11 2042  . lithium carbonate (LITHOBID) CR tablet 300 mg  300 mg Oral Q12H Sanjuana Kava, NP   300 mg at 05/27/11  1610  . lubiprostone (AMITIZA) capsule 8 mcg  8 mcg Oral BID WC Orson Aloe, MD   8 mcg at 05/27/11 0847  . magnesium hydroxide (MILK OF MAGNESIA) suspension 30 mL  30 mL Oral Daily PRN Sanjuana Kava, NP      . megestrol (MEGACE) tablet 40 mg  40 mg Oral BID Orson Aloe, MD   40 mg at 05/27/11 9604  . naproxen (NAPROSYN) tablet 250 mg  250 mg Oral BID WC Orson Aloe, MD   250 mg at 05/27/11 0847  . pantoprazole (PROTONIX) EC tablet 40 mg  40 mg Oral Daily Orson Aloe, MD   40 mg at 05/27/11 0847  . prazosin (MINIPRESS) capsule 2 mg  2 mg Oral QHS PRN Orson Aloe, MD   2 mg at 05/26/11 0101  . prazosin (MINIPRESS) capsule 5 mg  5 mg Oral QHS Orson Aloe, MD   5 mg at 05/26/11 2228  . SUMAtriptan (IMITREX) tablet 100 mg  100 mg Oral Q2H PRN Orson Aloe, MD   100 mg at 05/19/11 1119    Lab Results: No results found for this or any previous  visit (from the past 48 hour(s)).  Physical Findings: AIMS:  , ,  ,  ,    CIWA:    COWS:     Treatment Plan Summary: Daily contact with patient to assess and evaluate symptoms and progress in treatment Medication management. Patient is on 1:1 supervision for safety.  Plan: Continue with current treatment plan.  Armandina Stammer I 05/27/2011, 4:50 PM

## 2011-05-27 NOTE — Progress Notes (Signed)
[facility] Group Notes:  (Counselor/Nursing/MHT/Case Management/Adjunct)  05/27/2011 12:32 PM  Type of Therapy:  Group Therapy  Participation Level:  Did Not Attend  Wilmon Arms 05/27/2011, 12:32 PM  Cosigned by: Angus Palms, LCSW

## 2011-05-28 IMAGING — CR DG CHEST 2V
2 series · 2 of 2 positions shown · non-contrast
Comparison: 02/21/2010.

CLINICAL DATA: Syncope.  Hypertension.

CHEST - 2 VIEW

[w chest pa]
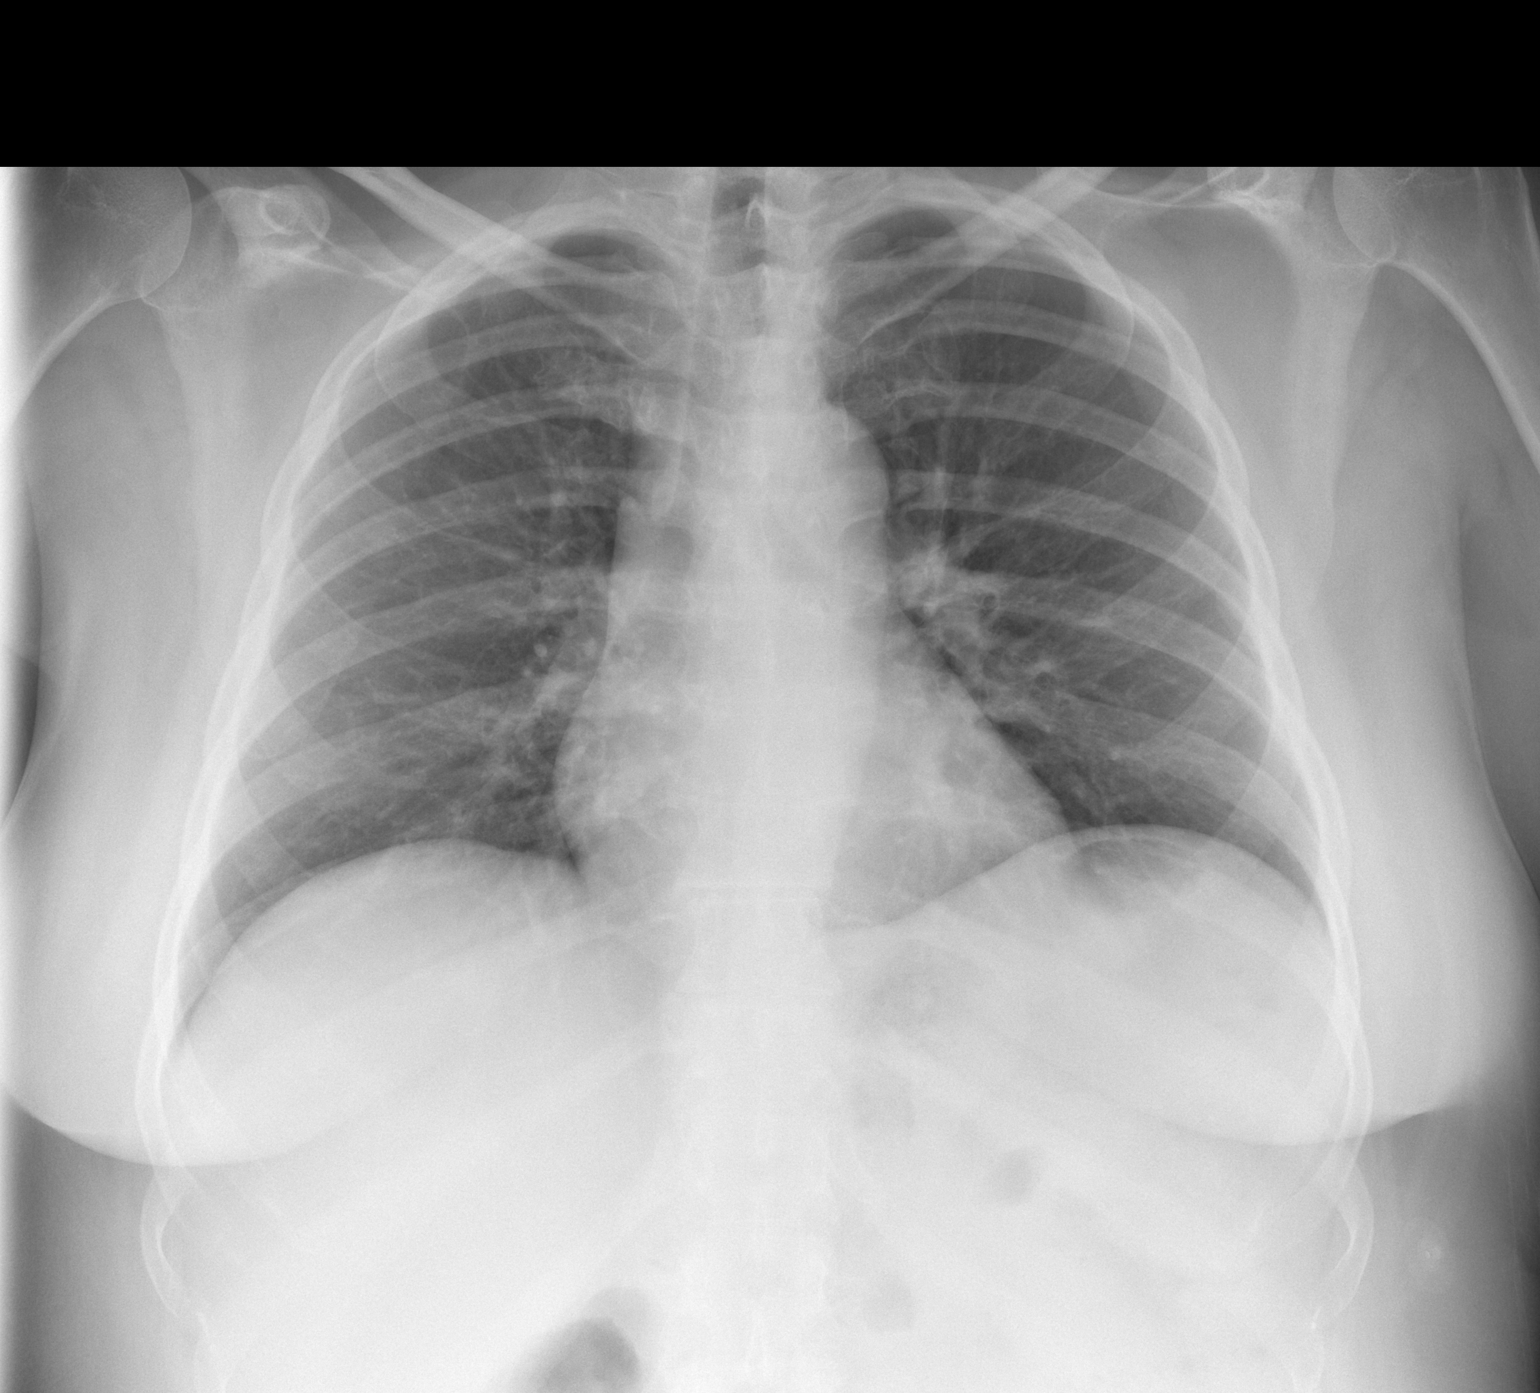

[w chest lat]
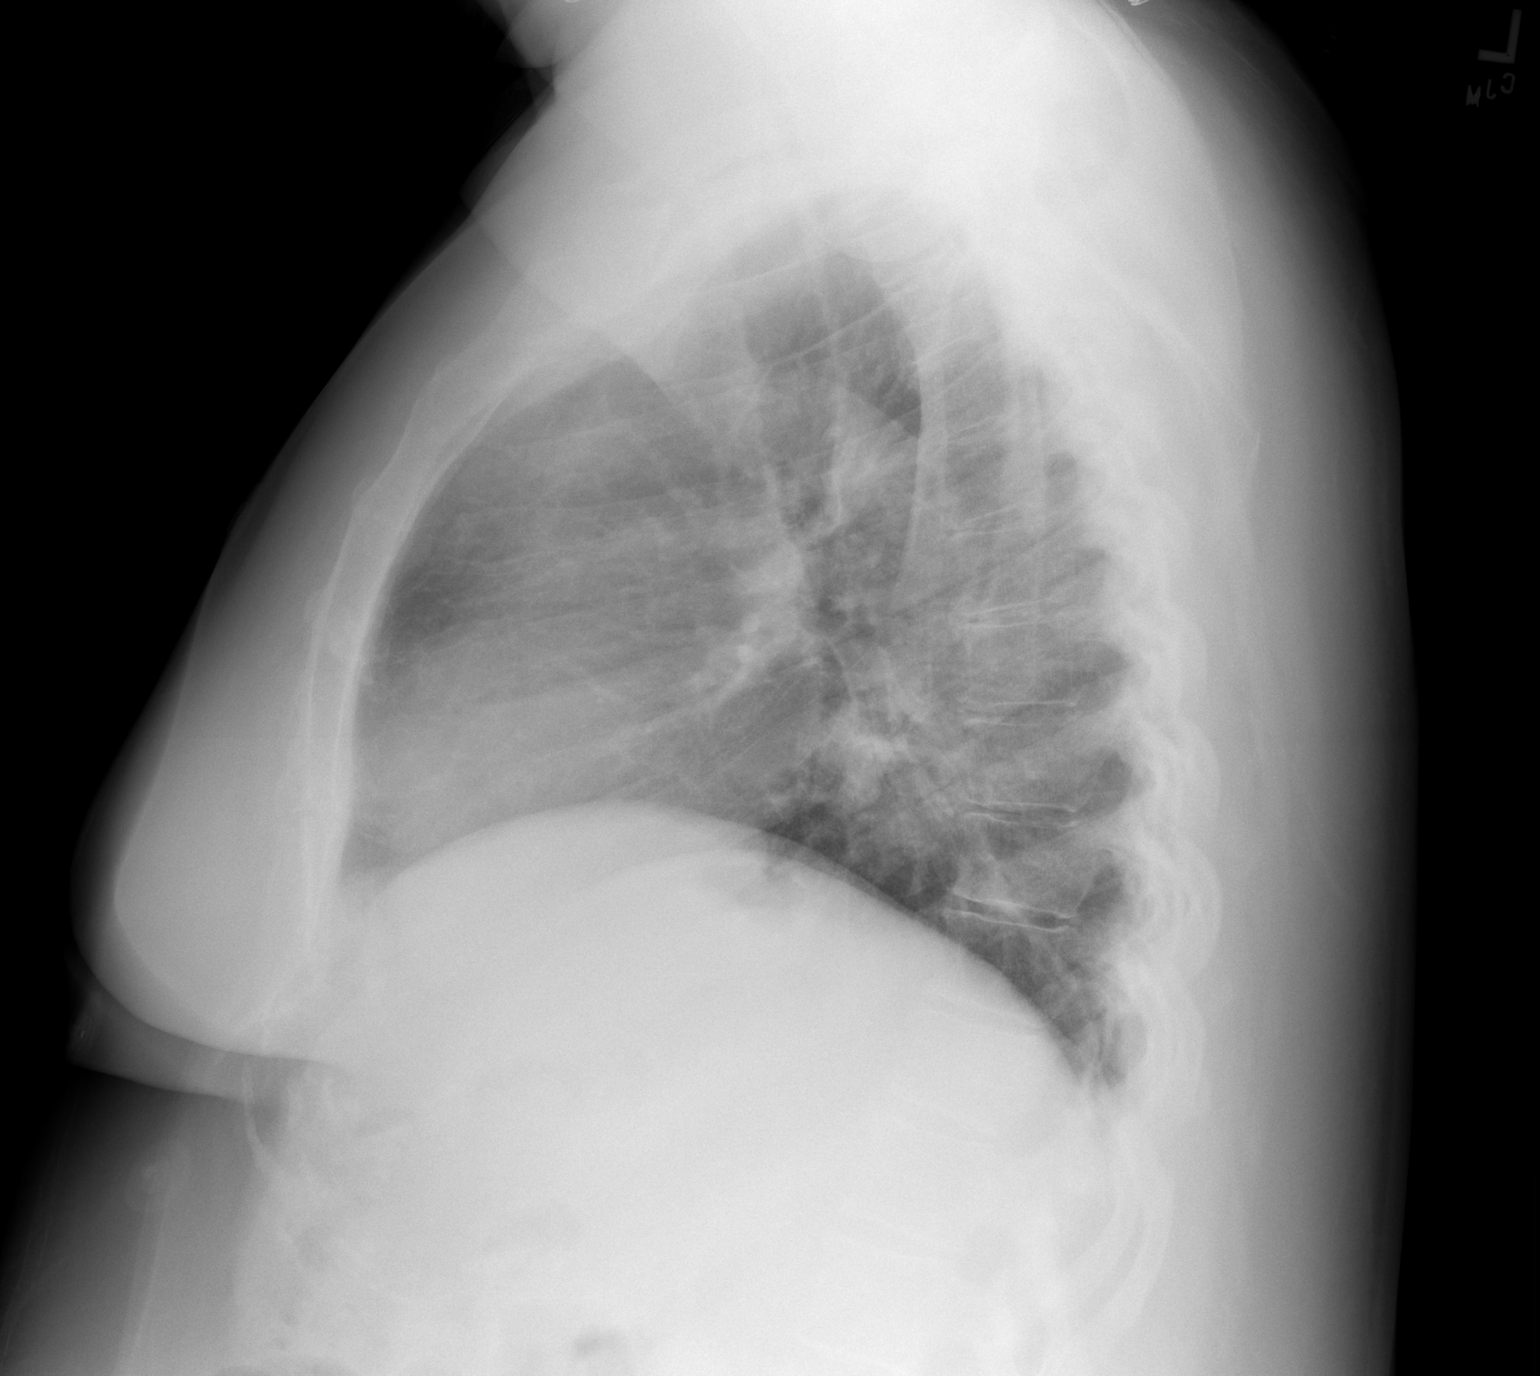

[2 of 2 positions shown; findings below may reference images not displayed]

FINDINGS: Stable normal sized heart and clear lungs.  Unremarkable
bones.
IMPRESSION: Normal examination, unchanged.

## 2011-05-28 MED ORDER — CITALOPRAM HYDROBROMIDE 40 MG PO TABS: 40.0000 mg | ORAL_TABLET | Freq: Every day | ORAL | Status: DC

## 2011-05-28 MED ORDER — GABAPENTIN 600 MG PO TABS: 600.0000 mg | ORAL_TABLET | Freq: Three times a day (TID) | ORAL | Status: DC

## 2011-05-28 MED ORDER — LUBIPROSTONE 24 MCG PO CAPS: 24.0000 ug | ORAL_CAPSULE | Freq: Two times a day (BID) | ORAL | Status: DC

## 2011-05-28 MED ORDER — LITHIUM CARBONATE ER 300 MG PO TBCR: 300.0000 mg | EXTENDED_RELEASE_TABLET | Freq: Two times a day (BID) | ORAL | Status: DC

## 2011-05-28 MED ORDER — HALOPERIDOL 5 MG PO TABS: 5.0000 mg | ORAL_TABLET | Freq: Two times a day (BID) | ORAL | Status: DC

## 2011-05-28 MED ORDER — SUMATRIPTAN SUCCINATE 100 MG PO TABS: 100.0000 mg | ORAL_TABLET | ORAL | Status: DC | PRN

## 2011-05-28 MED ORDER — PRAZOSIN HCL 5 MG PO CAPS: 5.0000 mg | ORAL_CAPSULE | Freq: Every day | ORAL | Status: DC

## 2011-05-28 MED ORDER — NAPROXEN 250 MG PO TABS: 250.0000 mg | ORAL_TABLET | Freq: Two times a day (BID) | ORAL | Status: DC

## 2011-05-28 MED ORDER — ALBUTEROL SULFATE HFA 108 (90 BASE) MCG/ACT IN AERS: 1.0000 | INHALATION_SPRAY | RESPIRATORY_TRACT | Status: DC | PRN

## 2011-05-28 MED ORDER — MEGESTROL ACETATE 40 MG PO TABS: 40.0000 mg | ORAL_TABLET | Freq: Two times a day (BID) | ORAL | Status: AC

## 2011-05-28 MED ORDER — BENZTROPINE MESYLATE 1 MG PO TABS: 1.0000 mg | ORAL_TABLET | Freq: Two times a day (BID) | ORAL | Status: DC

## 2011-05-28 MED ORDER — PANTOPRAZOLE SODIUM 40 MG PO TBEC: 40.0000 mg | DELAYED_RELEASE_TABLET | Freq: Every day | ORAL | Status: DC

## 2011-05-28 MED ORDER — HYDROXYZINE HCL 25 MG PO TABS: 25.0000 mg | ORAL_TABLET | Freq: Every day | ORAL | Status: DC

## 2011-05-28 NOTE — Progress Notes (Signed)
1245  1:1 note, cannot contract for safety, is awaiting transport to another facility, parents in to visit w/her, eating meals in her room, states she is depressed 10/10 and hopeless 10/10, ability to pay attention is improving, appetite is imporving, not attending group but does interact w/some of her peers, taking meds as ordered, continues to be hi towards children Continues w/1:1 for safety and Safety maintained

## 2011-05-28 NOTE — Tx Team (Signed)
Interdisciplinary Treatment Plan Update (Adult)  Date:  05/28/2011  Time Reviewed:  10:29 AM   Progress in Treatment: Attending groups:   Yes   Participating in groups:  Yes Taking medication as prescribed:  Yes Tolerating medication:  Yes Family/Significant othe contact made: No contact made with family Patient understands diagnosis:  Yes Discussing patient identified problems/goals with staff: Yes Medical problems stabilized or resolved: Yes Denies suicidal/homicidal ideation:Yes Issues/concerns per patient self-inventory:  Other:  New problem(s) identified:  Reason for Continuation of Hospitalization: Anxiety Medication stabilization Suicidal ideation  Interventions implemented related to continuation of hospitalization:  Medication Management; safety checks q 15 mins  Additional comments:  Laticha in need of ECT Treatment  Estimated length of stay:   Discharge to [facility] today  Discharge Plan:  [facility] for ECT treatment  New goal(s):  Review of initial/current patient goals per problem list:   1.  Goal(s):  Eliminate  Met:  No  Target date: d/c  As evidenced by:  Deferred -No progression - Ben remains depressed and on remains on 1:!  2.  Goal (s):  Reduce Depresson  Met:  No  Target date: d/c  As evidenced by:  Deferred -No progression - Misako's depression remains at ten  3.  Goal(s):  Stabilize on medication  Met:  No  Target date:  d/c  As evidenced by: Deferred - No progression- Brielle has failed multiple medication trials   4.  Goal(s):  Refer to [facility] for ECT  Met:  Yes  Target date: d/c  As evidenced by:  Abeni has been accepted to [facility] today  Attendees: Patient:     Family:     Physician:  Orson Aloe, MD 05/28/2011 10:29 AM   Nursing:   Amie Critchley, RN 05/28/2011 10:29 AM   CaseManager:  Juline Patch, LCSW 05/28/2011 10:29 AM   Counselor:  Angus Palms, LCSW 05/28/2011 10:29 AM    05/28/2011 10:29 AM   Other:  Reyes Ivan, LCSWA  05/28/2011  10:29 AM   Other:     Other:      Scribe for Treatment Team:   Wynn Banker, LCSW,  05/28/2011 10:29 AM

## 2011-05-28 NOTE — Progress Notes (Signed)
[facility] Family/Significant Other Suicide Prevention Education  Suicide Prevention Education:  Patient Discharged to Other Healthcare Facility:  Suicide Prevention Education Not Provided [name] [name] is discharging to [facility] for ECT:  The patient is discharging to another healthcare facility for continuation of treatment.  The patient's medical information, including suicide ideations and risk factors, are a part of the medical information shared with the receiving healthcare facility.  [name] 05/28/2011, 10:27 AM
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
## 2011-05-28 NOTE — Progress Notes (Signed)
[facility] Case Management Discharge Plan:  Will you be returning to the same living situation after discharge: No. At discharge, do you have transportation home?:No. Do you have the ability to pay for your medications:No.  Interagency Information:     Release of information consent forms completed and in the chart;  Patient's signature needed at discharge.  Patient to Follow up at:    Patient denies SI/HI:   No.    Safety Planning and Suicide Prevention discussed:  Yes,  Patient remains unsafe to discharge home.  She will transfer to [facility]  Barrier to discharge identified:Yes,  Remains suicidal  Summary and Recommendations:  Patient suicidal.  She will transfer to [facility]   Coady Train, Joesph July 05/28/2011, 10:37 AM

## 2011-05-28 NOTE — Progress Notes (Signed)
Call from [place] at [facility] advising of a bed available for patient to discharge to their facility today.  IVC forms faxed to [employer].  Office Paschal Research officer, political party to clarify patient being transported to [facility].  All legal and clinical information faxed to [facility] as requested.  Call back from [facility] confirming all paperwork in order and patient can admit to their facility.  Patient advised and please to be getting the help she requested.

## 2011-05-28 NOTE — Progress Notes (Signed)
[facility] Group Notes:  (Counselor/Nursing/MHT/Case Management/Adjunct)  05/28/2011 2:32 PM  Type of Therapy:  1:15PM [employer] Group  Participation Level:  Did Not Attend  Wilmon Arms 05/28/2011, 2:32 PM  Cosigned by: Angus Palms, LCSW

## 2011-05-28 NOTE — Progress Notes (Signed)
Pt is still unable to contract for safety. Pt also continues to endorse HI towards her children. Continued support and availability was extended to this pt. Pt safety remains with continuous observation.

## 2011-05-28 NOTE — Progress Notes (Signed)
Discharged from hospital in custody of sheriff on her way to [facility], belongings given to patient/sheriff, all other paperwork given to sheriff, patient continues to endorse Si and HI.
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
## 2011-05-28 NOTE — Progress Notes (Signed)
[facility] Group Notes:  (Counselor/Nursing/MHT/Case Management/Adjunct)  05/28/2011 2:31 PM  Type of Therapy:  Group Therapy  Participation Level:  Did Not Attend  [name] [name] 05/28/2011, 2:31 PM  Cosigned by: Angus Palms, LCSW

## 2011-05-28 NOTE — Progress Notes (Signed)
1610   Patient came to med window for morning medications.   Stated she is still trying to come up with a way to hurt herself.   Clinical research associate for safety.  Stated she needs medication for sinus congestion.   Did not eat any breakfast.  Has been to bathroom this morning but last BM was yesterday.  Wants to hurt her family and does not know why.  Denied A/V hallucinations.   Denied pain.  Stated she is not feeling any better than yesterday.  1: continues for safety per MD order.  Safety maintained.  Respirations even and unlabored.  No signs of pain/distress noted on patient's face or body movements.   1:1 continues.

## 2011-05-31 NOTE — Progress Notes (Signed)
Patient Discharge Instructions:  Admission Note Faxed,  05/28/2011 After Visit Summary Faxed,  05/28/2011 Faxed to the [facility] Care provider:  05/28/2011 D/C Summary Note sent 05/28/2011  paperwork sent with sheriff transport to [facility].  Wandra Scot, 05/31/2011, 5:57 PM

## 2011-06-01 IMAGING — CR DG CHEST 2V
2 series · 2 of 2 positions shown · non-contrast
Comparison: 02/27/2010

CLINICAL DATA: Right chest wall pain.  Shortness of breath.

CHEST - 2 VIEW

[w chest pa]
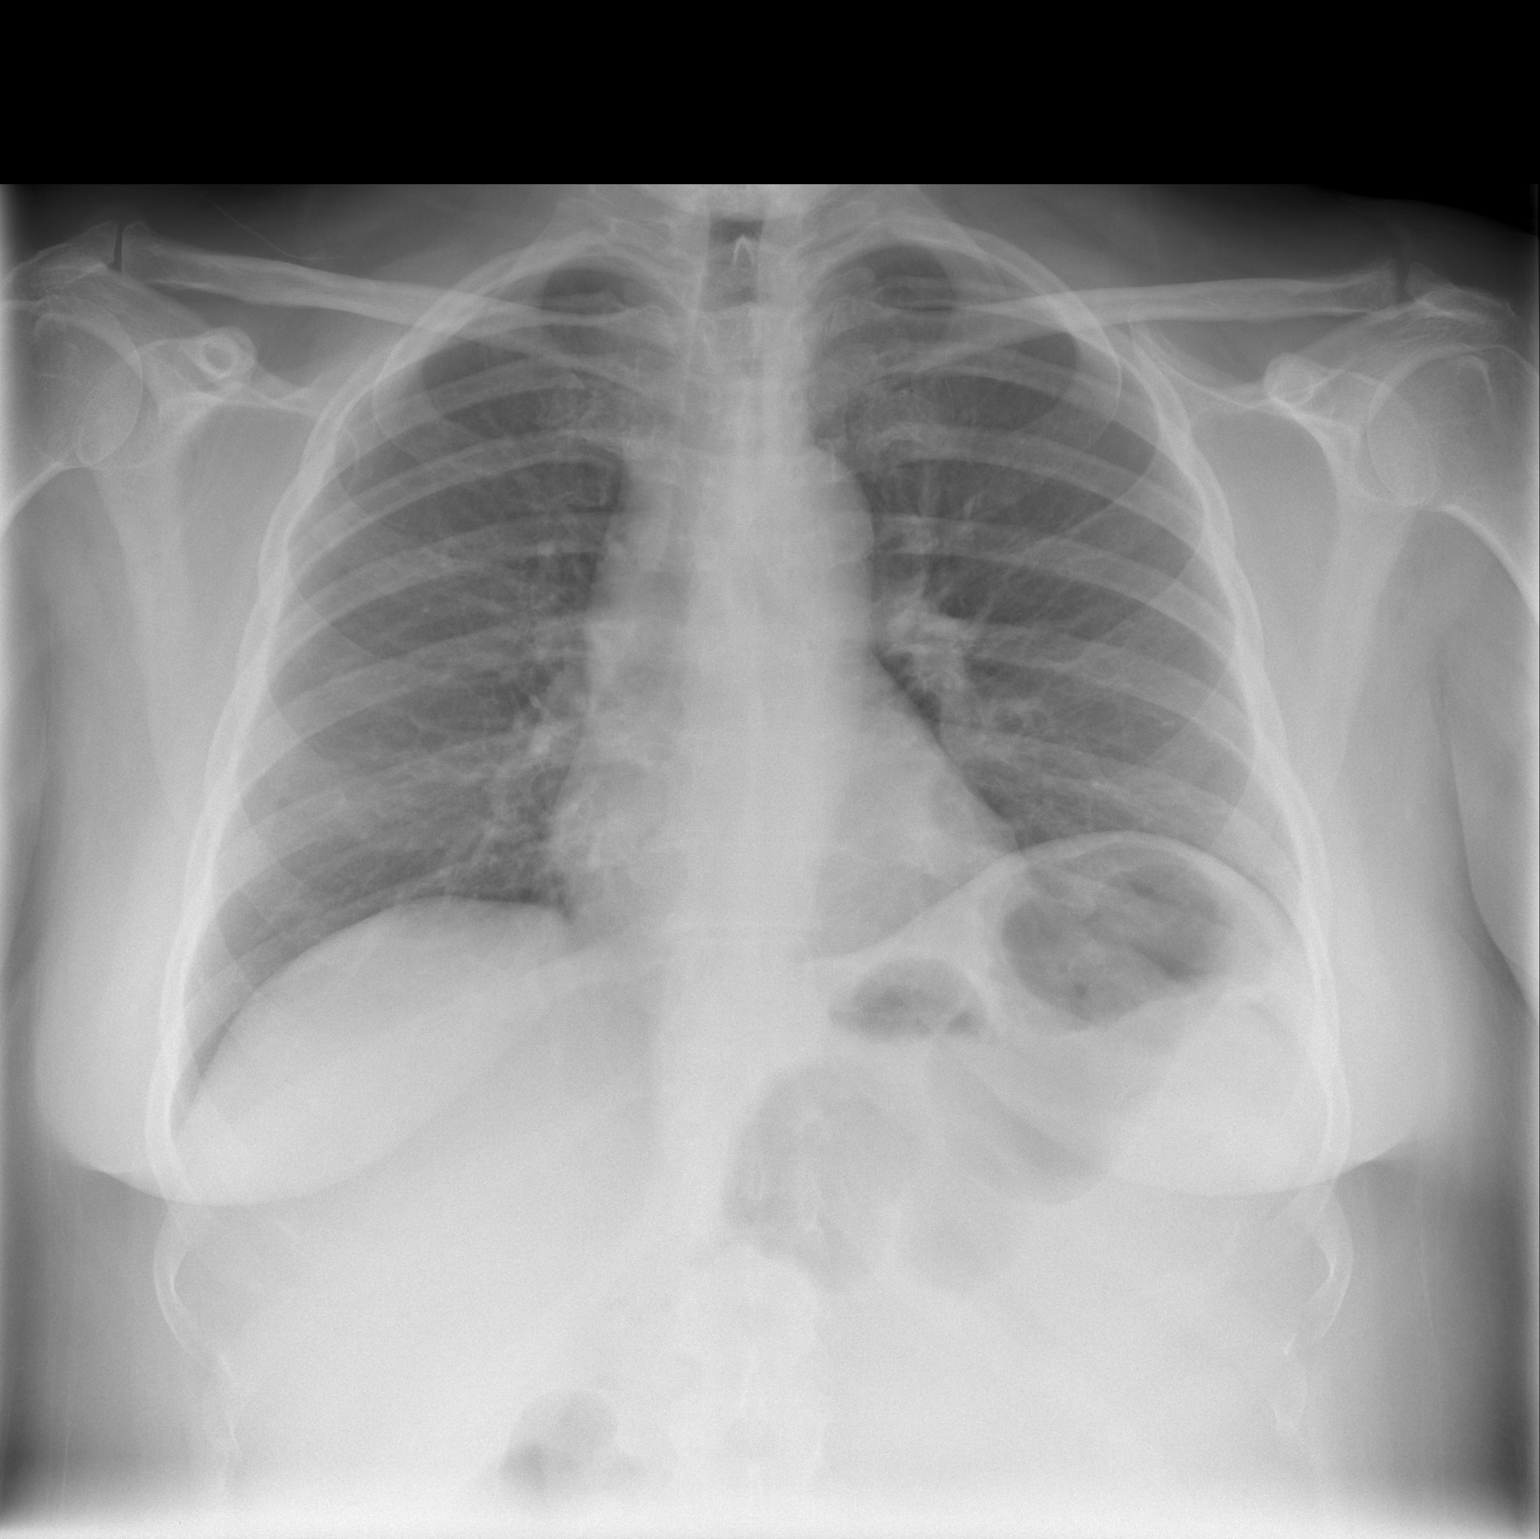

[w chest lat]
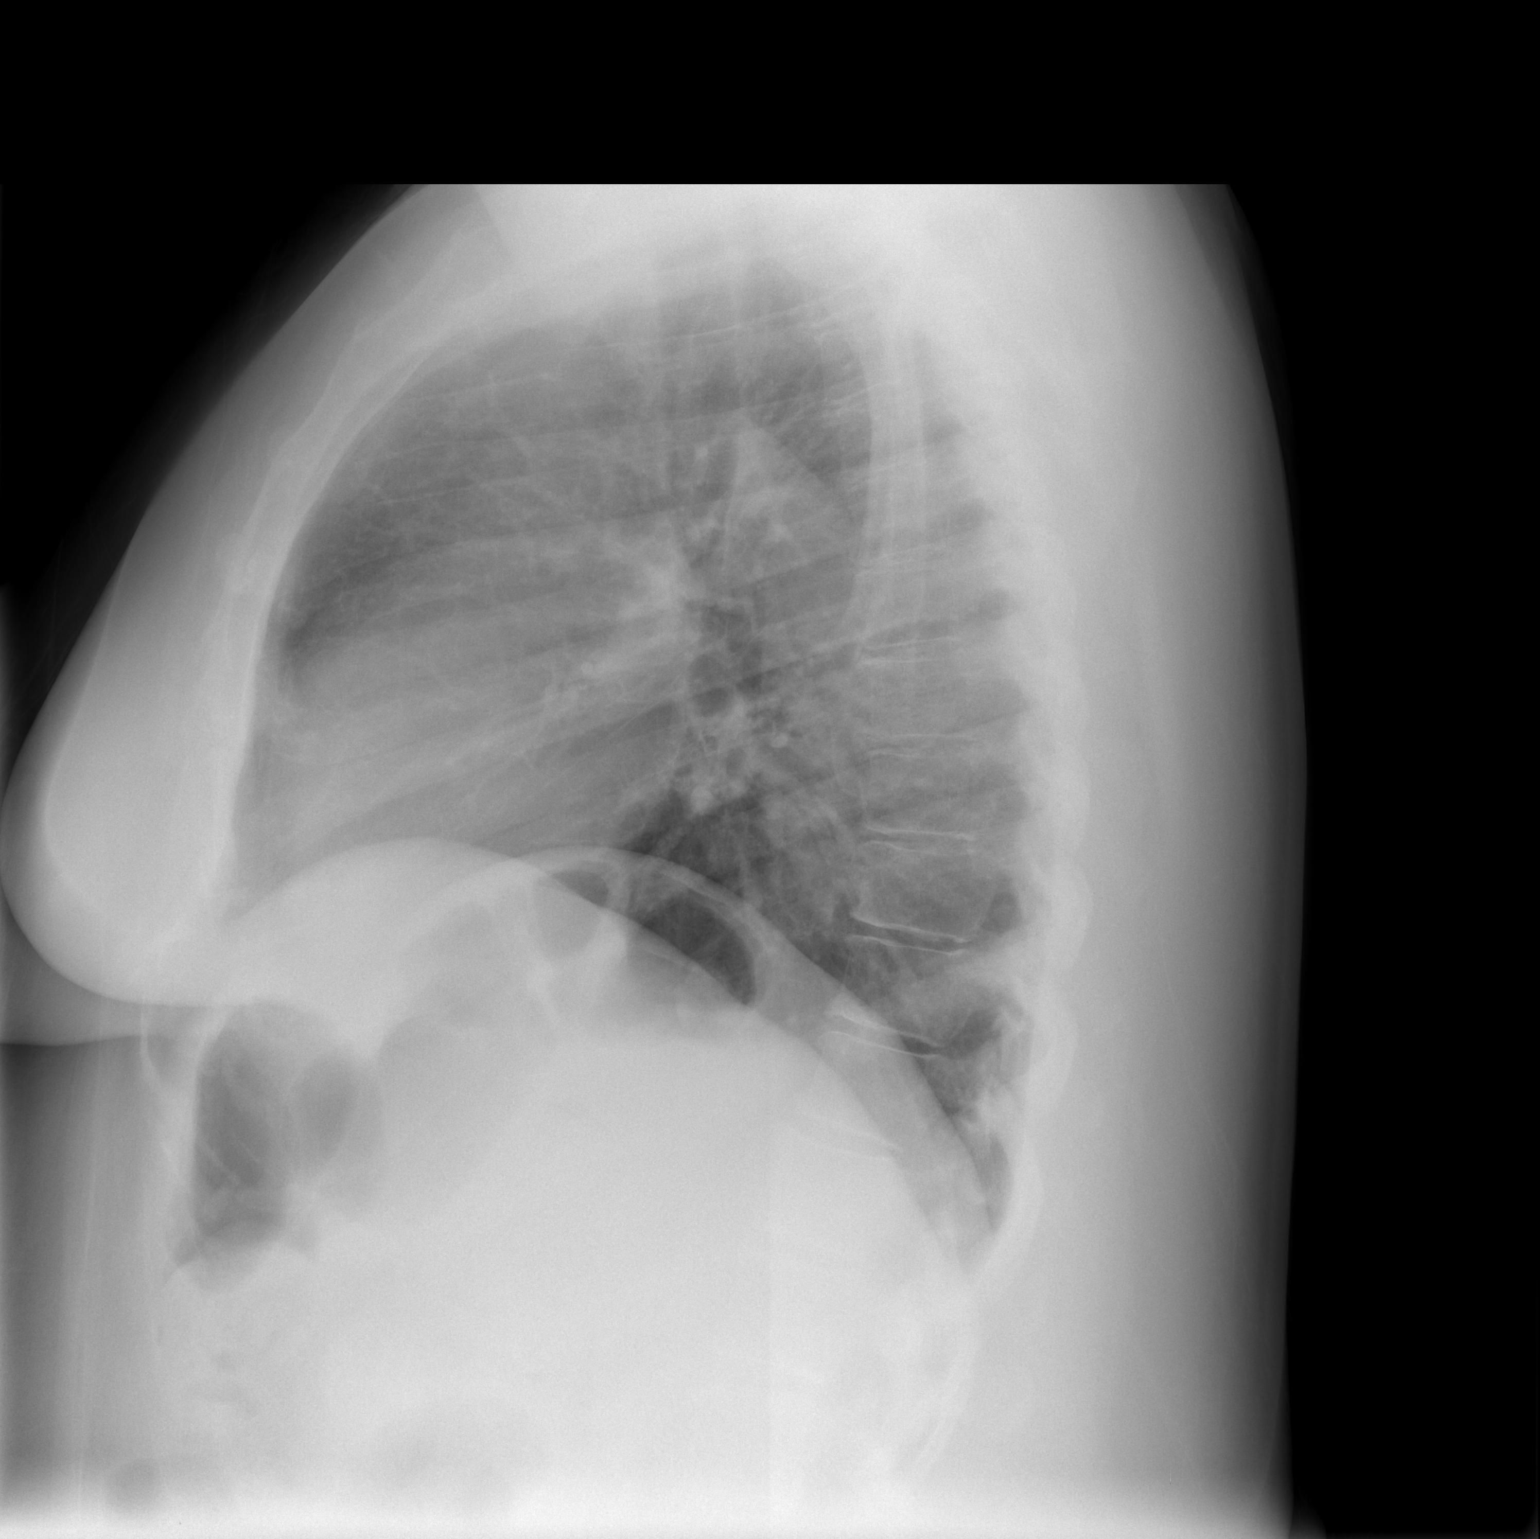

[2 of 2 positions shown; findings below may reference images not displayed]

FINDINGS: Heart and mediastinal contours are within normal limits.
No focal opacities or effusions.  No acute bony abnormality.
IMPRESSION: No active disease.

## 2011-06-02 IMAGING — CR DG CHEST 2V
2 series · 2 of 2 positions shown · non-contrast
Comparison: 03/03/2010

CLINICAL DATA: Syncope, nausea and dizziness.

CHEST - 2 VIEW

[w chest pa]
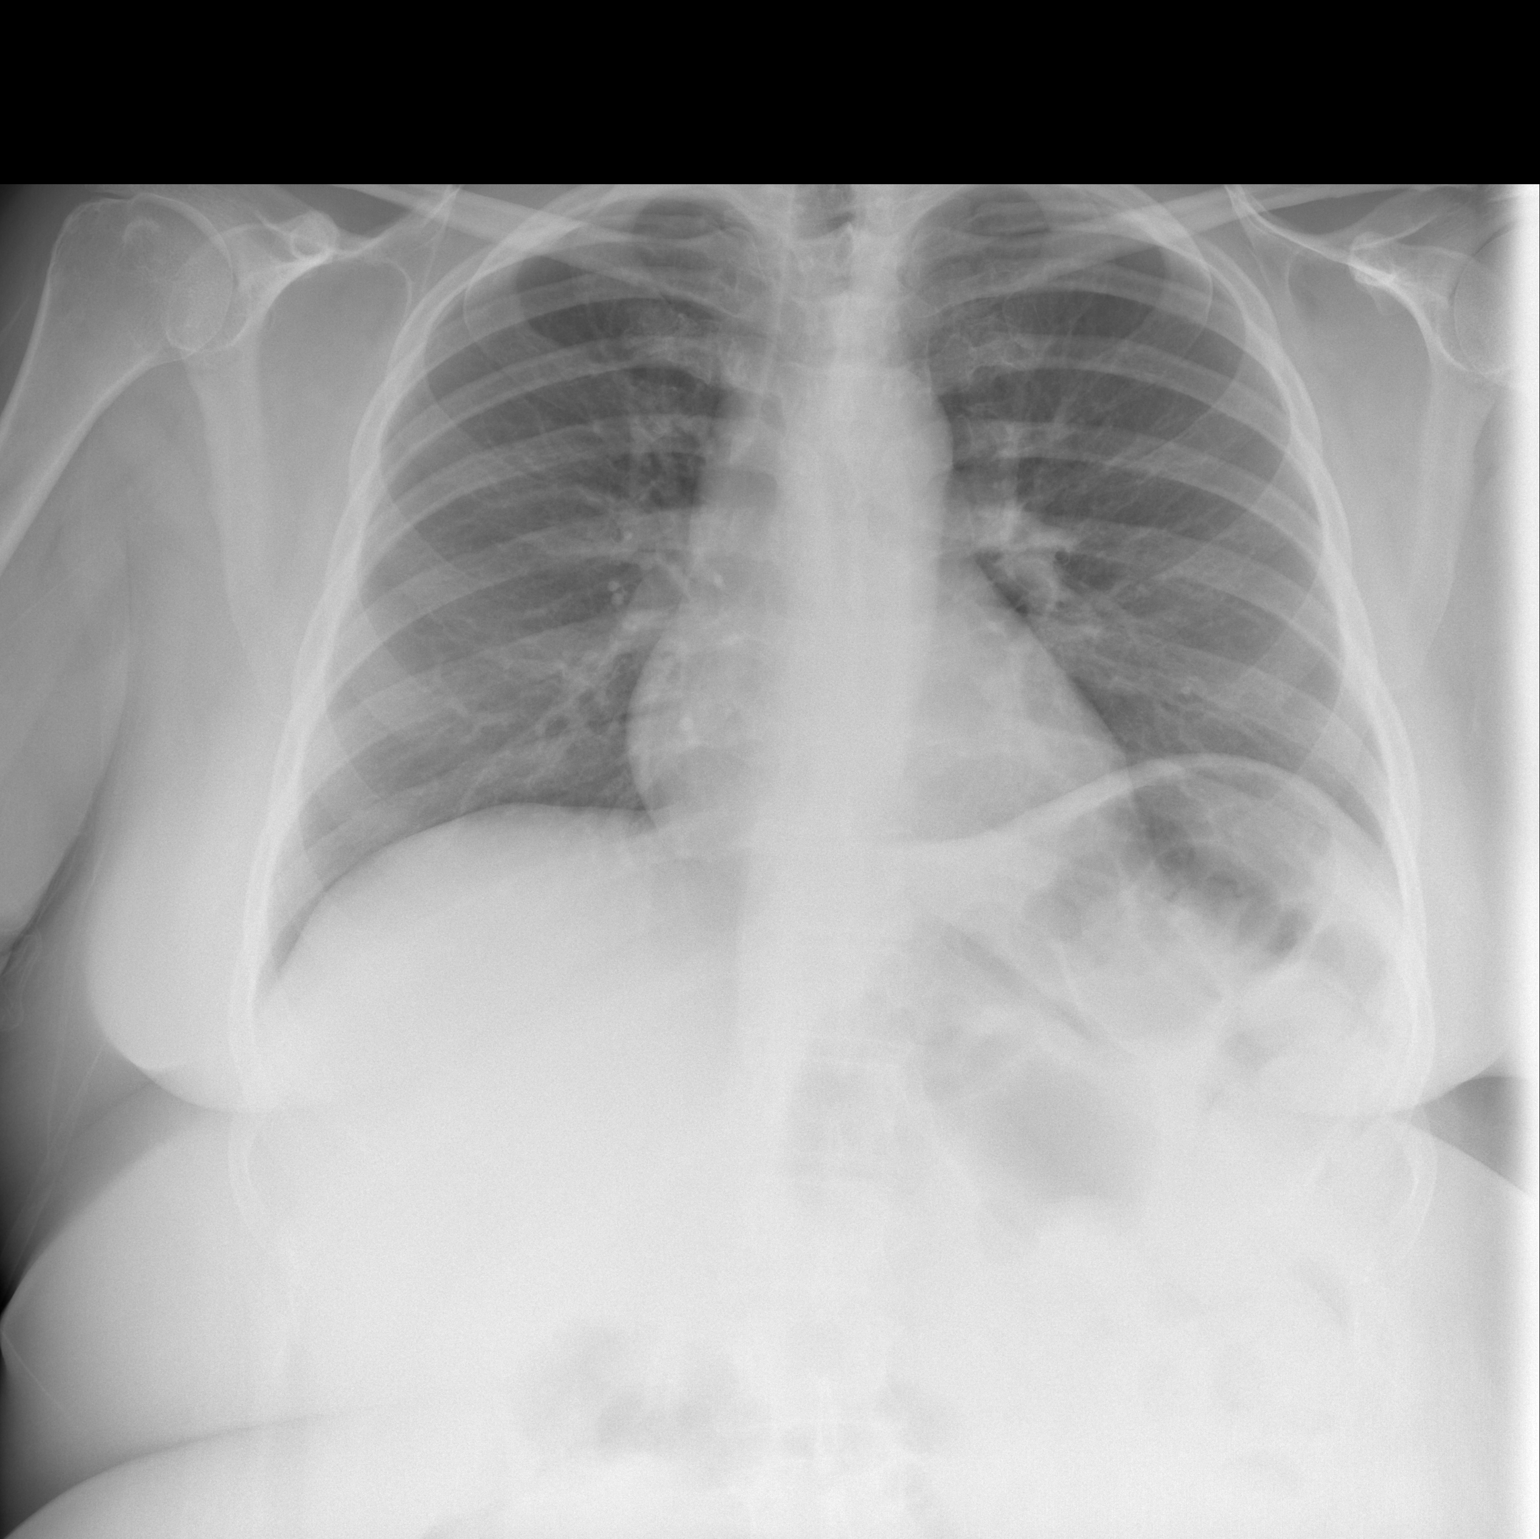

[w chest lat]
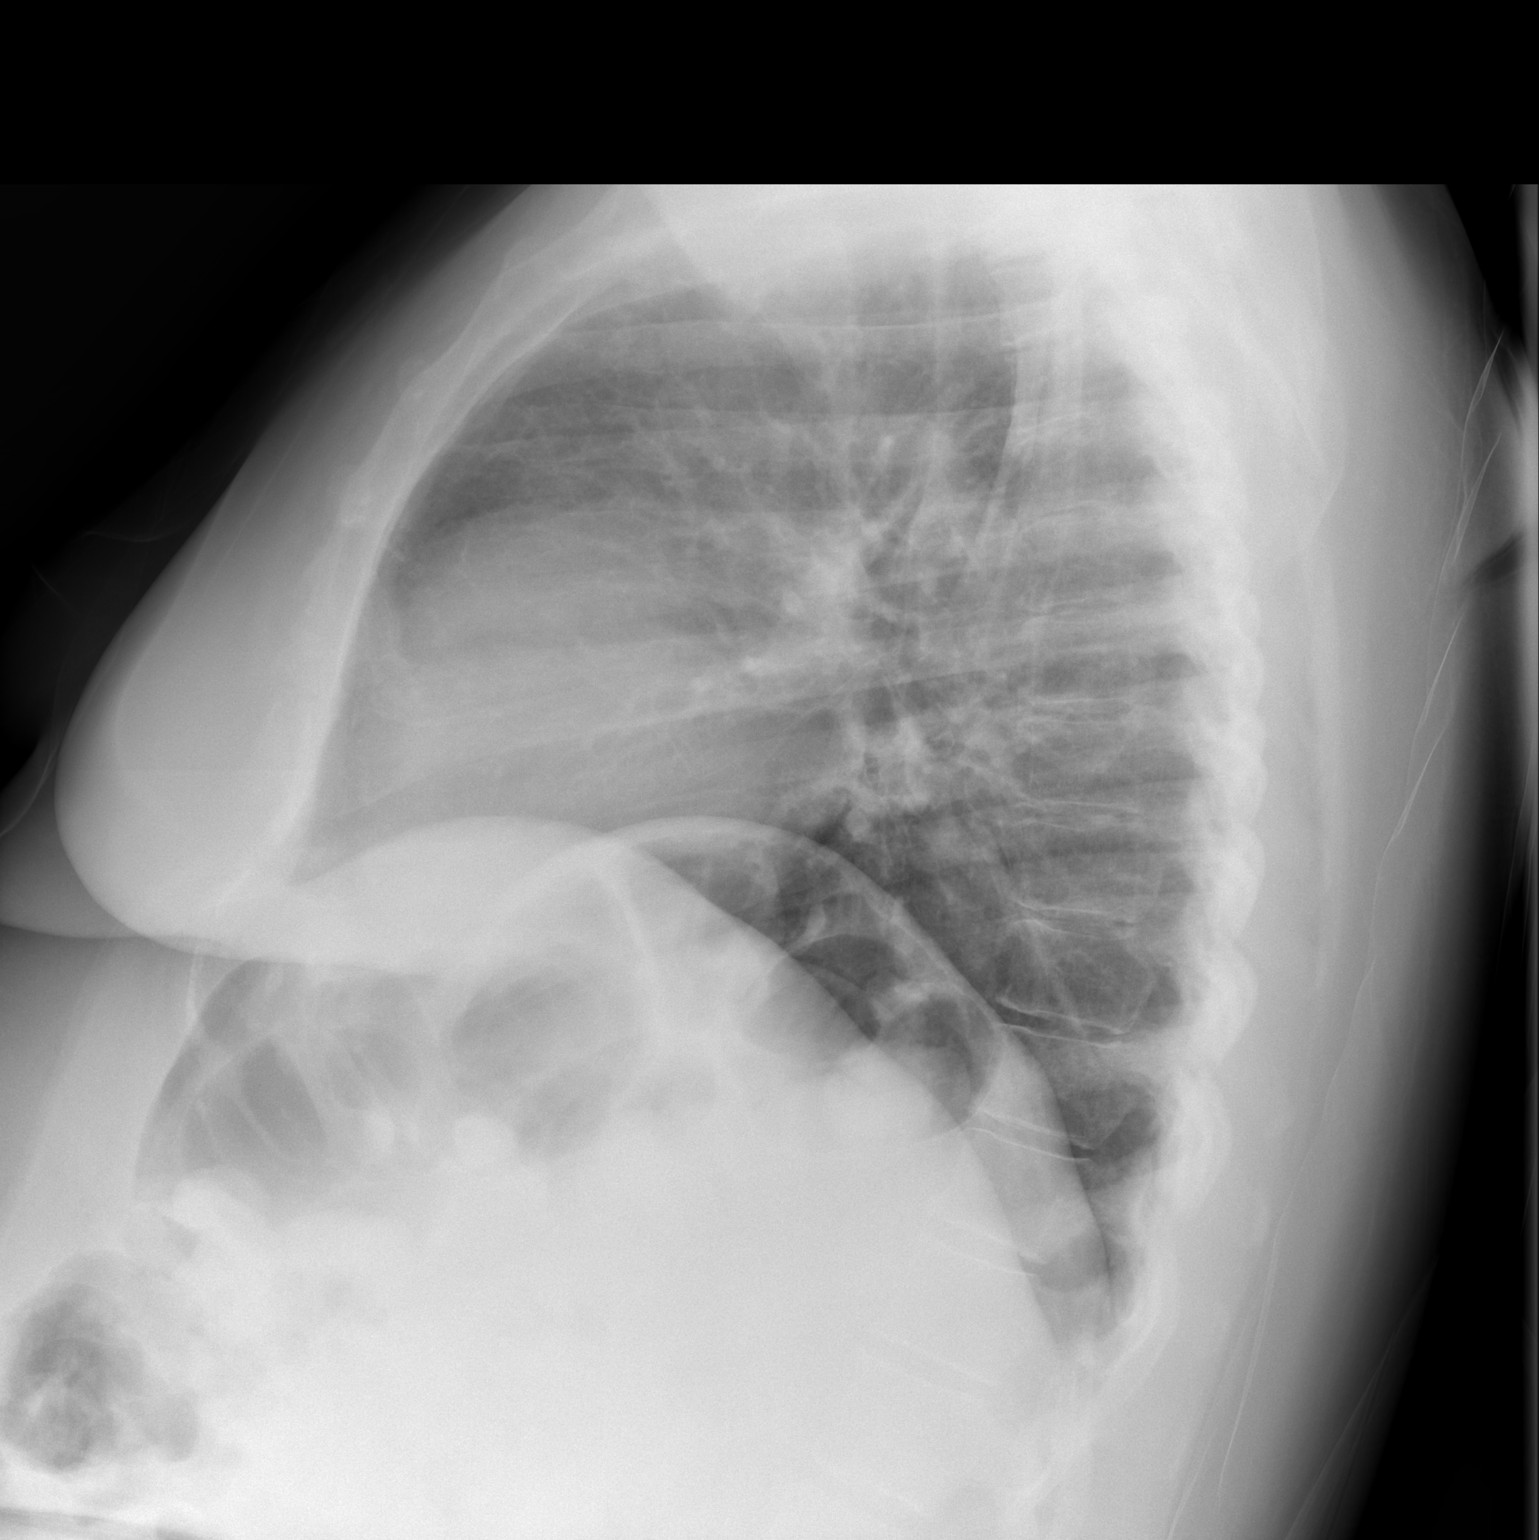

[2 of 2 positions shown; findings below may reference images not displayed]

FINDINGS: The cardiomediastinal silhouette is unremarkable.
The lungs are clear.
There is no evidence of focal airspace disease, pulmonary edema,
pulmonary nodule/mass, pleural effusion, or pneumothorax.
No acute bony abnormalities are identified.
IMPRESSION: No evidence of acute cardiopulmonary disease.

## 2011-06-07 IMAGING — CR DG HAND COMPLETE 3+V*L*
3 series · 3 of 3 positions shown · non-contrast
Comparison: 09/10/07

CLINICAL DATA: Pain over fourth and fifth metacarpals with swelling
and limited range of motion.  Rule out fracture. No trauma history
submitted.

LEFT HAND - COMPLETE 3+ VIEW

[x hand ap left]
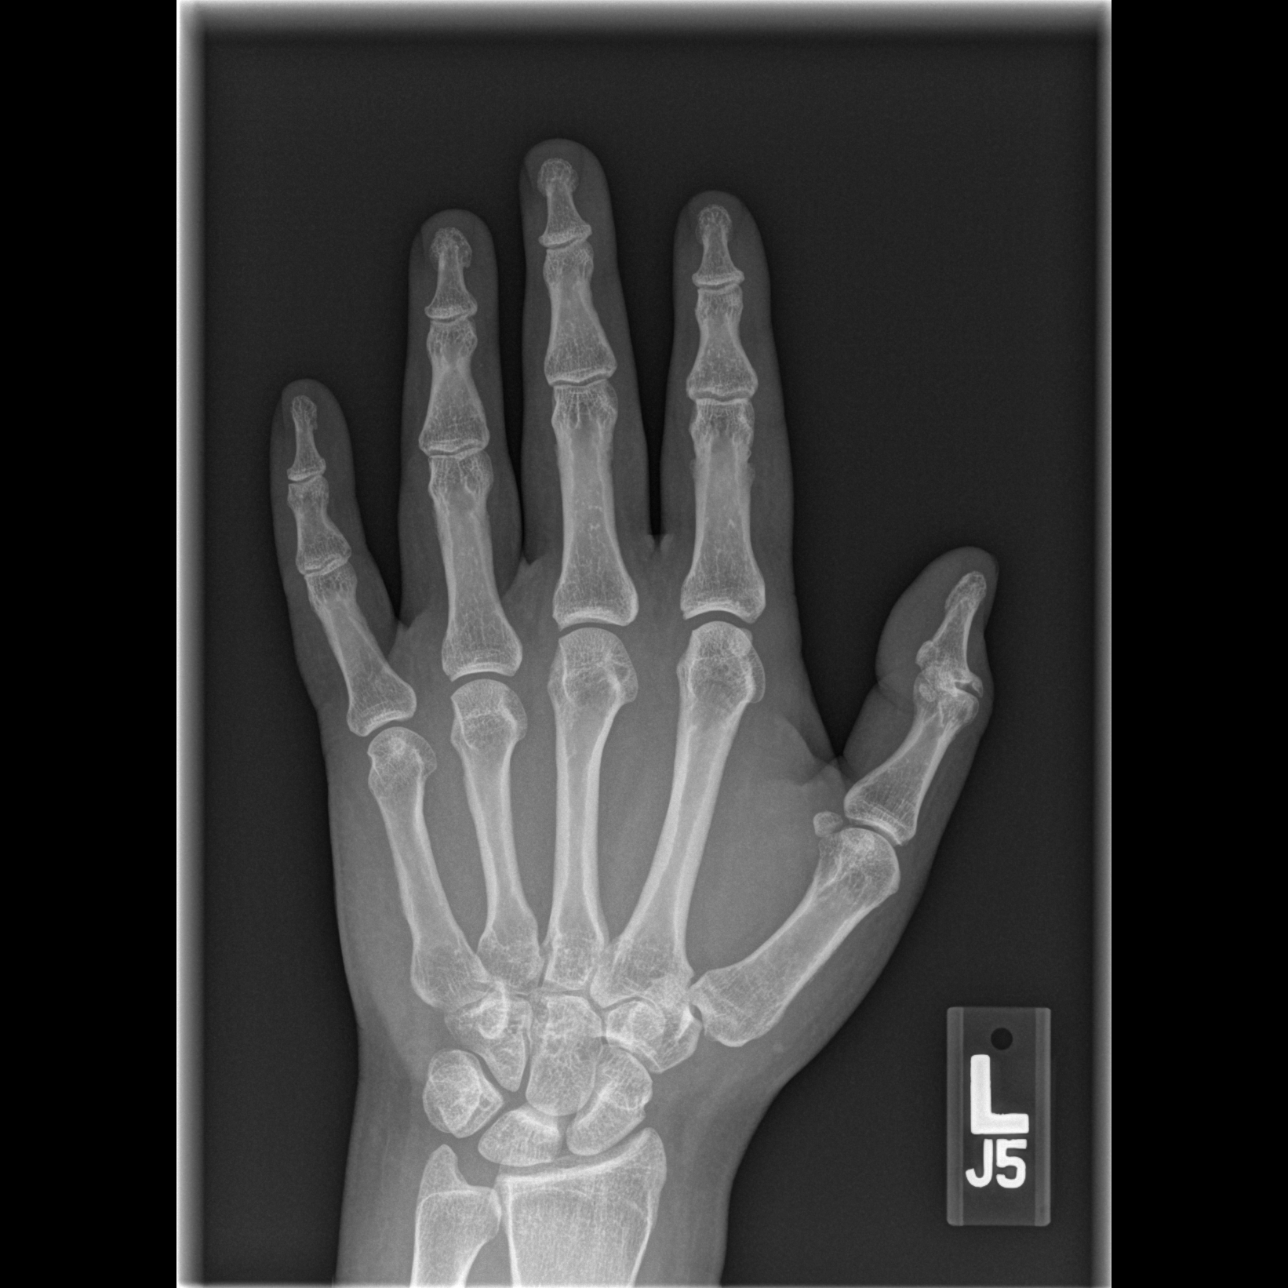

[x hand oblique left]
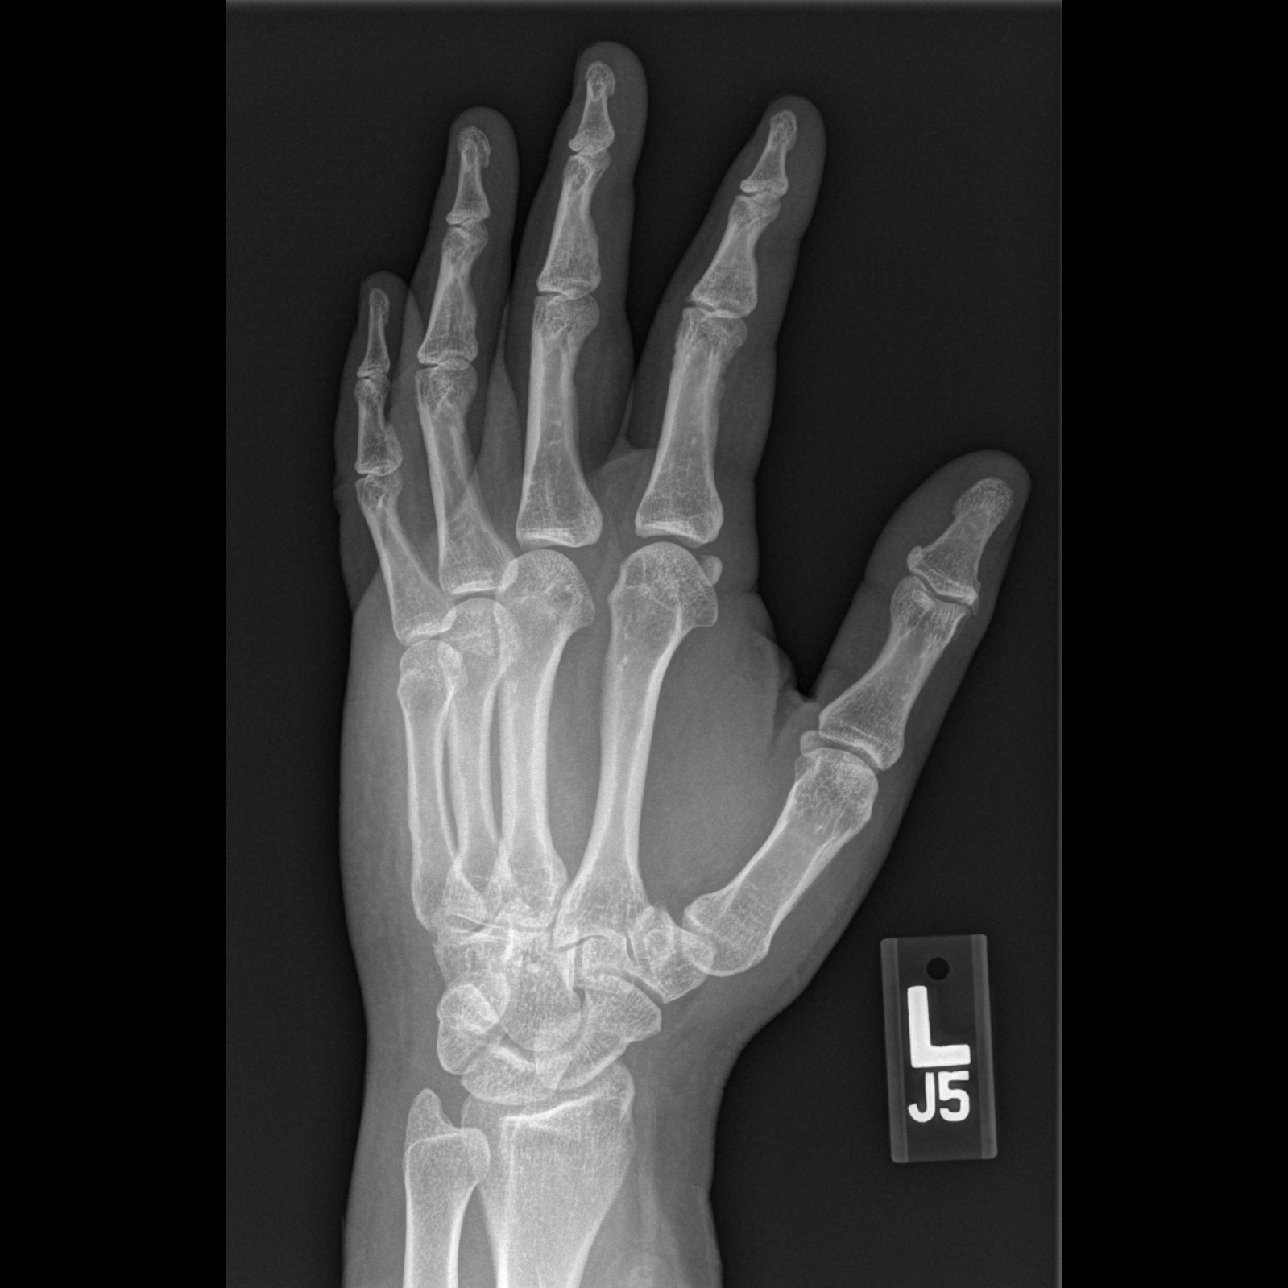

[x hand lat left]
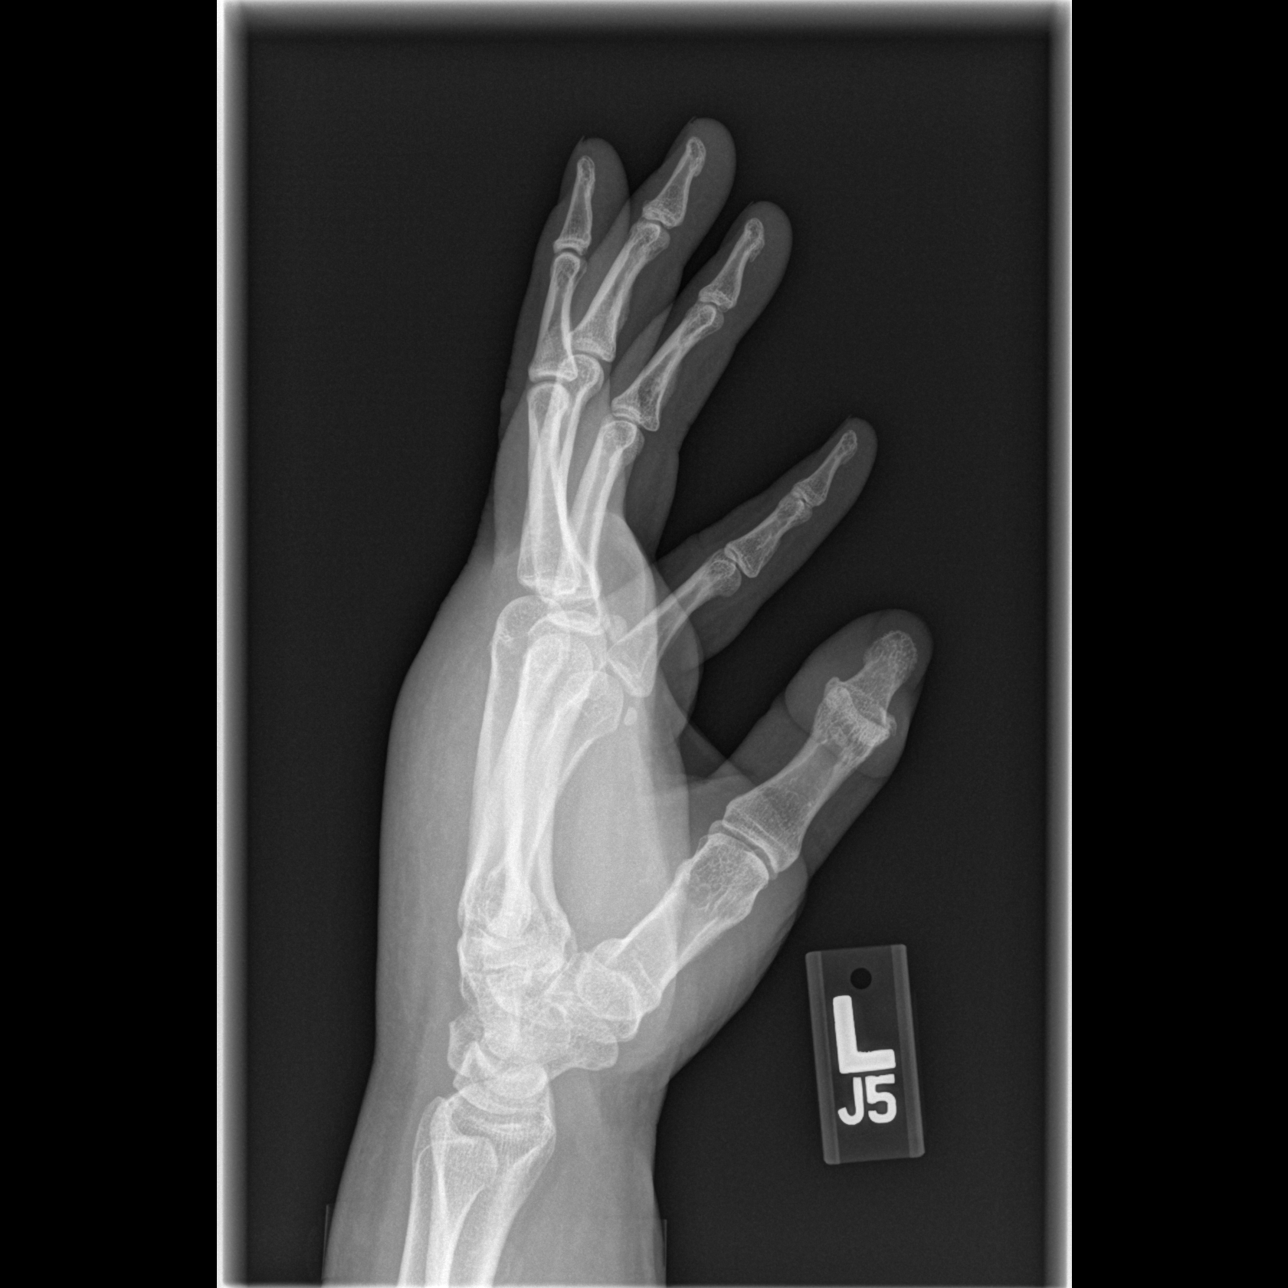

[3 of 3 positions shown; findings below may reference images not displayed]

FINDINGS: Moderate to marked dorsal soft tissue swelling at the
level of the metacarpals. No acute fracture or dislocation.
IMPRESSION: Dorsal soft tissue swelling without acute osseous abnormality.

## 2011-08-03 IMAGING — MR MR LUMBAR SPINE W/O CM
5 series · 46 of 48 positions shown · non-contrast
Comparison: Radiograph 01/21/2010.  MRI 06/03/2009.

CLINICAL DATA: 44-year-old female with low back pain with recent
fall.  Pain radiates to the hips and bilateral leg.  Left foot
numbness.  Prior spine surgery.

MRI LUMBAR SPINE WITHOUT CONTRAST
TECHNIQUE: Multiplanar and multiecho pulse sequences of the lumbar
spine were obtained without intravenous contrast.

[Series 3: T2 · sagittal · 4.0mm · 0.88mm/px · 6 of 12 slices shown (1 of 2)]
[im 1/12]
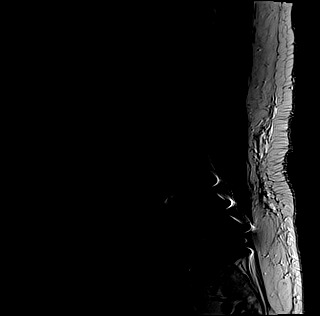
[im 3/12]
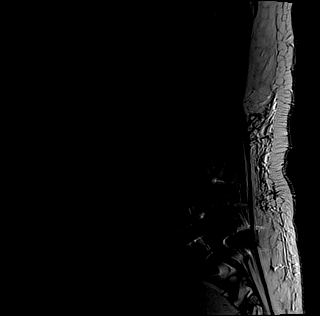
[im 5/12]
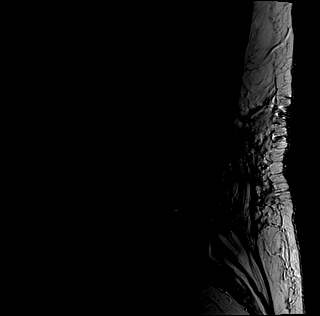
[im 7/12]
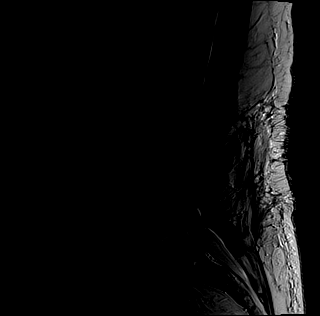
[im 9/12]
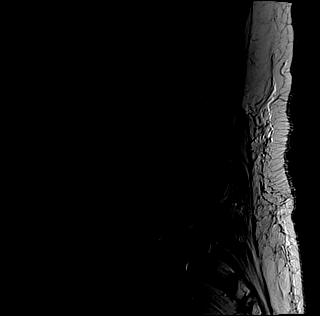
[im 12/12]
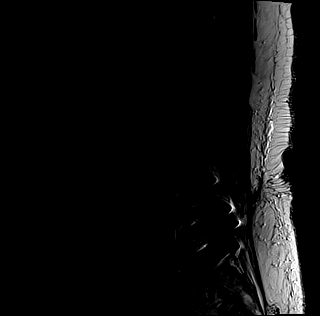

[Series 4: T1 · sagittal · 4.0mm · 0.88mm/px · 5 of 12 slices shown (1 of 2)]
[im 1/12]
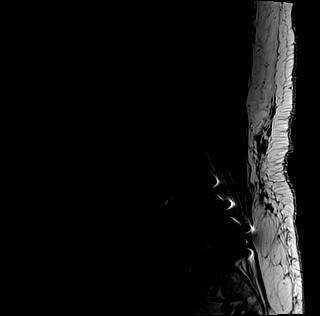
[im 3/12]
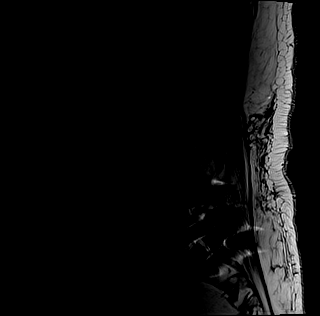
[im 6/12]
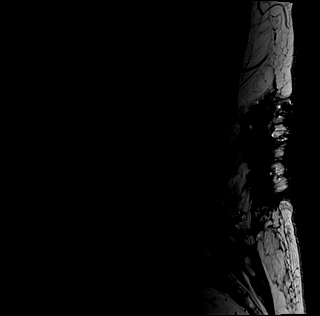
[im 9/12]
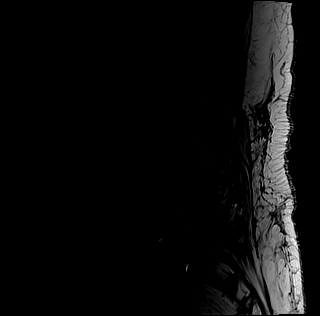
[im 12/12]
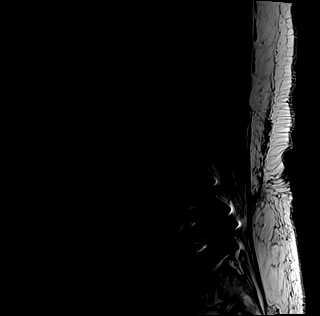

[Series 5: tirm sag · sagittal · 4.0mm · 0.55mm/px · 5 of 12 slices shown]
[im 1/12]
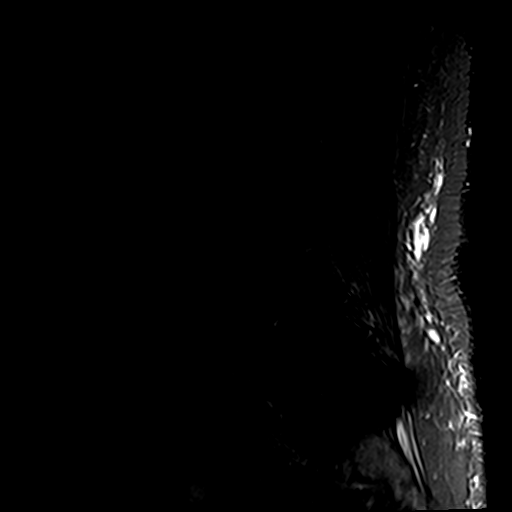
[im 3/12]
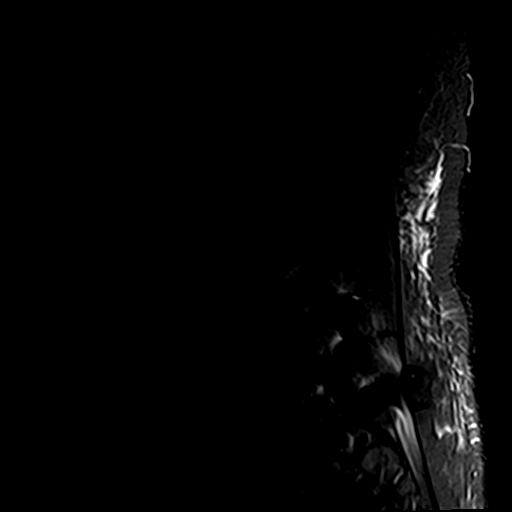
[im 6/12]
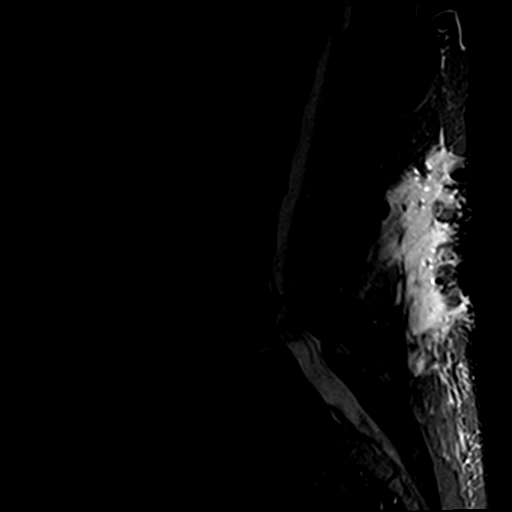
[im 9/12]
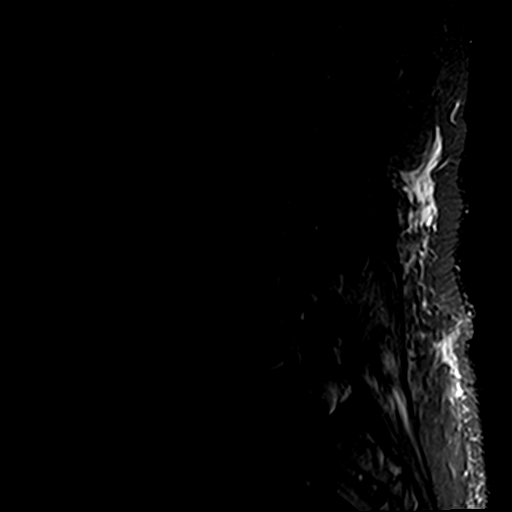
[im 12/12]
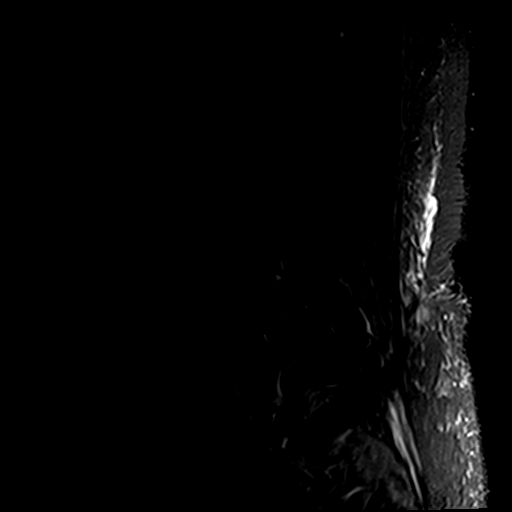

[Series 6: T1 · axial · 4.0mm · 0.70mm/px · z∈[-110,+92]mm · 14 of 35 slices shown (2 of 2)]
[im 1/35]
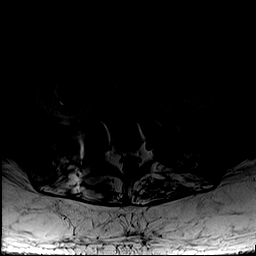
[im 3/35]
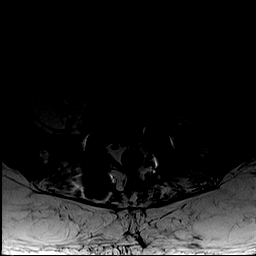
[im 5/35]
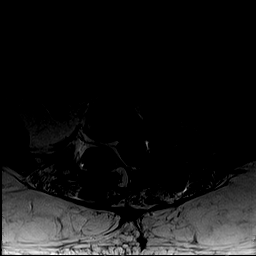
[im 7/35]
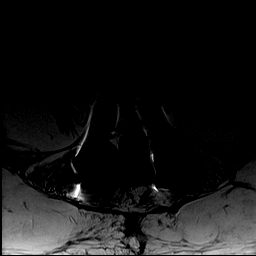
[im 10/35]
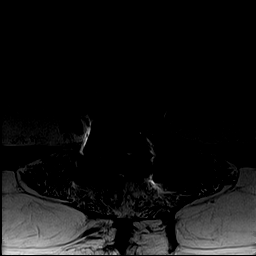
[im 12/35]
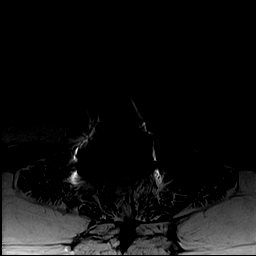
[im 14/35]
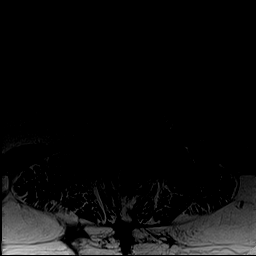
[im 16/35]
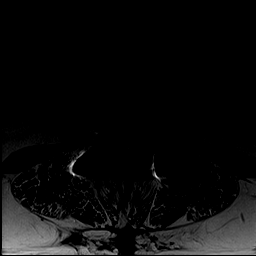
[im 19/35]
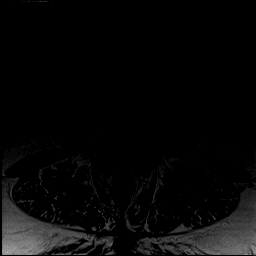
[im 21/35]
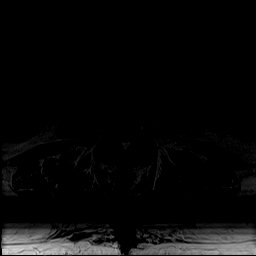
[im 23/35]
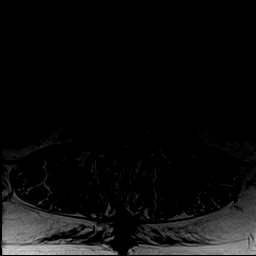
[im 25/35]
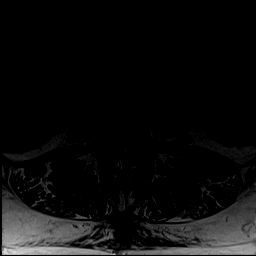
[im 30/35]
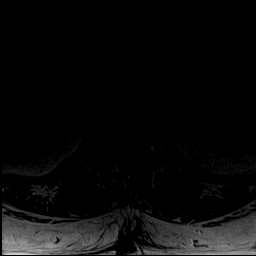
[im 35/35]
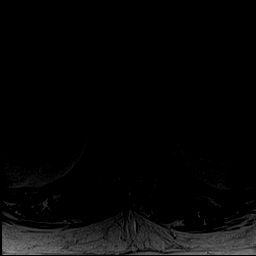

[Series 7: T2 · axial · 4.0mm · 0.70mm/px · z∈[-110,+92]mm · 16 of 35 slices shown (2 of 2)]
[im 1/35]
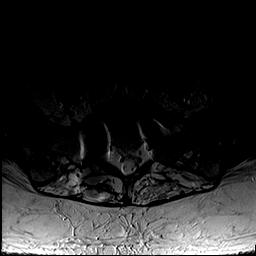
[im 3/35]
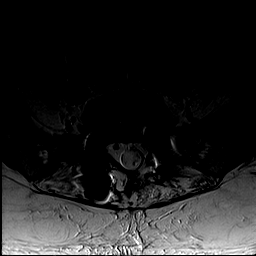
[im 5/35]
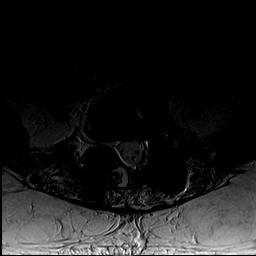
[im 7/35]
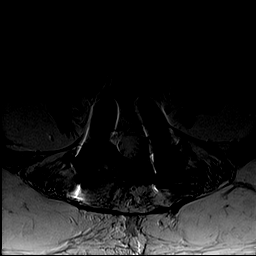
[im 10/35]
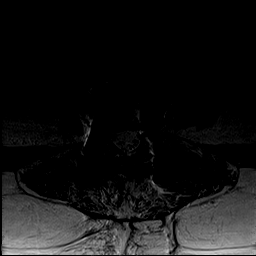
[im 12/35]
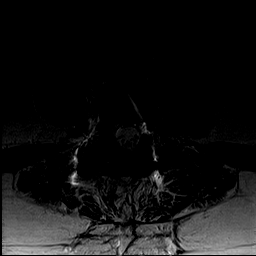
[im 14/35]
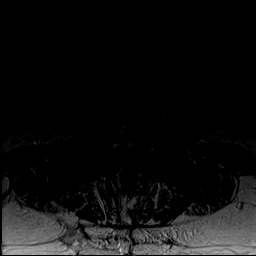
[im 16/35]
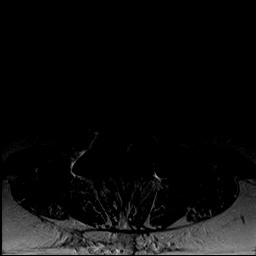
[im 19/35]
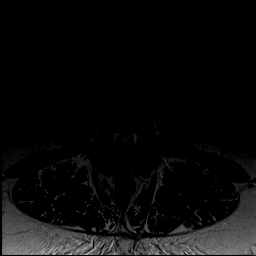
[im 21/35]
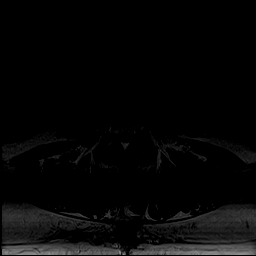
[im 23/35]
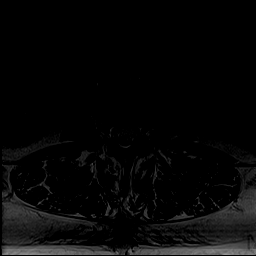
[im 25/35]
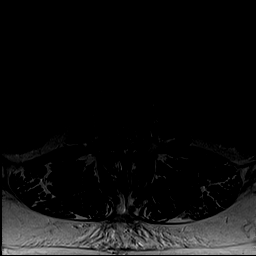
[im 28/35]
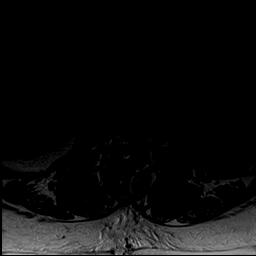
[im 30/35]
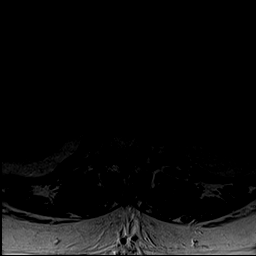
[im 32/35]
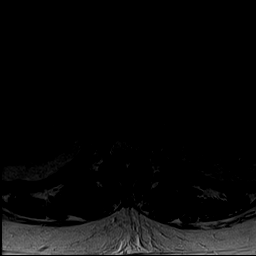
[im 35/35]
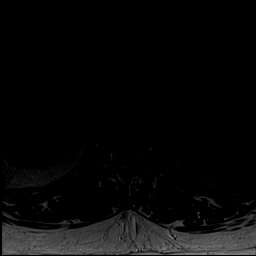

[46 of 48 positions shown; findings below may reference images not displayed]

FINDINGS: Mild metal susceptibility artifact re-identified related
to the L4-L5 and L5-S1 transpedicular fusion hardware.  Interbody
hardware also present at these levels.  Postoperative changes to
the posterior paraspinal soft tissues.  No postoperative
collection.  Additional postoperative details below.

Stable vertebral height and alignment.  Visualized abdominal
viscera are within normal limits.  Visualized lower thoracic spinal
cord is normal with conus medularis at T12-L1.

T10-T11:  Unchanged minor disc bulge.

T11-T12:  Negative.

T12-L1:  Negative.

L1-L2:  Negative.

L2-L3:  Mild to moderate facet hypertrophy is stable.  Negative
disc.

L3-L4:  Chronic disc bulge at this level.   New/progressed central
disc protrusion with cephalad migration of disc material (series 3
image 6).  Progression of facet hypertrophy, now with fluid in both
facet joints.  Combined there is mild to moderate spinal stenosis.
No significant foraminal involvement.

L4-L5:  Sequelae of fusion and decompression.  Widely patent thecal
sac and foramen.

L5-S1:  Sequelae effusion and decompression.  Unchanged apparent
residual bony narrowing of the left L5 foramen.  Otherwise thecal
sac and neural foramen are widely patent.
IMPRESSION: 1.  Progression of disc and facet degeneration at L3-L4.  Central
disc protrusion with cephalad migration of disc material now
present.  Combined there is mild to moderate spinal stenosis.
2.  Otherwise stable lumbar spine.  L4-L5 and L5-S1 postoperative
changes with no adverse features.

## 2011-08-26 DIAGNOSIS — G2401 Drug induced subacute dyskinesia: Secondary | ICD-10-CM | POA: Insufficient documentation

## 2011-10-02 ENCOUNTER — Emergency Department (INDEPENDENT_AMBULATORY_CARE_PROVIDER_SITE_OTHER)
Admission: EM | Admit: 2011-10-02 | Discharge: 2011-10-02 | Disposition: A | Discharge status: Home / Self Care | Payer: Medicare Other | Source: Home / Self Care | Attending: Emergency Medicine | Admitting: Emergency Medicine

## 2011-10-02 ENCOUNTER — Encounter (HOSPITAL_COMMUNITY): Payer: Self-pay | Admitting: *Deleted

## 2011-10-02 DIAGNOSIS — R5383 Other fatigue: Secondary | ICD-10-CM

## 2011-10-02 DIAGNOSIS — R059 Cough, unspecified: Secondary | ICD-10-CM

## 2011-10-02 DIAGNOSIS — R05 Cough: Secondary | ICD-10-CM

## 2011-10-02 DIAGNOSIS — R5381 Other malaise: Secondary | ICD-10-CM

## 2011-10-02 HISTORY — DX: Gastro-esophageal reflux disease without esophagitis: K21.9

## 2011-10-02 MED ORDER — ALBUTEROL SULFATE HFA 108 (90 BASE) MCG/ACT IN AERS: 2.0000 | INHALATION_SPRAY | RESPIRATORY_TRACT | Status: DC | PRN

## 2011-10-02 MED ORDER — IBUPROFEN 600 MG PO TABS: 600.0000 mg | ORAL_TABLET | Freq: Four times a day (QID) | ORAL | Status: AC | PRN

## 2011-10-02 MED ORDER — DEXAMETHASONE 4 MG PO TABS: ORAL_TABLET | ORAL | Status: AC

## 2011-10-02 NOTE — Discharge Instructions (Signed)
Is very common to feel fatigued when you're recovering from an illness, particularly if you were sick enough to require antibiotics. You're also on multiple medications that can cause excessive sedation. Contact your Dr. about possibly having some of these adjusted. The sure you talk to them they show that you're okay to have ECT done on Monday. Take the steroids, and use the albuterol every 4-6 hours and until your cough and wheezing improve. Return if you have a fever>100.4, if you get worse, or for other concerns.

## 2011-10-02 NOTE — ED Provider Notes (Signed)
History     CSN: [id number]  Arrival date & time 10/02/11  1118   First MD Initiated Contact with Patient 10/02/11 1135      Chief Complaint  Patient presents with  . Cough  . Nasal Congestion  . Fatigue    (Consider location/radiation/quality/duration/timing/severity/associated sxs/prior treatment) HPI Comments: Patient reports having a URI-like illness starting about 10 days ago, with coughing, wheezing, shortness of breath. She was treated with a Z-Pak, which she finished 6 days ago. She reports persistent fatigue, cough, and some wheezing. No chest pain, shortness of breath. She is able to sleep through the night without too much coughing. No nausea, vomiting, fevers. No ear pain, abdominal pain, urinary complaints. She has a history of asthma, but is not using her inhaler, currently. She has a history of bipolar/depression, and is on multiple medications for this. No recent change in her medications. She was on lithium several months ago, but has not taken any for at least a month. This was discontinued by her mental health provider. She denies any other depressive symptoms, suicidal homicidal ideations  ROS as noted in HPI. All other ROS negative.   Patient is a 46 y.o. female presenting with cough. The history is provided by the patient and a parent. No language interpreter was used.  Cough This is a new problem. The current episode started more than 1 week ago. The problem occurs constantly. The problem has been gradually improving. The cough is non-productive. There has been no fever. Associated symptoms include wheezing. Pertinent negatives include no chest pain, no chills, no rhinorrhea, no sore throat, no myalgias and no shortness of breath. Treatments tried: z pak. The treatment provided mild relief. She is not a smoker. Her past medical history is significant for asthma.    Past Medical History  Diagnosis Date  . Hypertension   . Asthma   . Sleep apnea   . Anxiety   .  Depression   . Bipolar 1 disorder     ect treatments last treatment Sep 02 1011  . GERD (gastroesophageal reflux disease)     Past Surgical History  Procedure Date  . Abdominal hysterectomy   . Back surgery   . Laproscopic knee surgery     History reviewed. No pertinent family history.  History  Substance Use Topics  . Smoking status: Never Smoker   . Smokeless tobacco: Not on file  . Alcohol Use: No    OB History    Grav Para Term Preterm Abortions TAB SAB Ect Mult Living                  Review of Systems  Constitutional: Negative for chills.  HENT: Negative for sore throat and rhinorrhea.   Respiratory: Positive for cough and wheezing. Negative for shortness of breath.   Cardiovascular: Negative for chest pain.  Musculoskeletal: Negative for myalgias.    Allergies  Codeine; Morphine and related; Tetracyclines & related; Tramadol; Ciprofloxacin; and Penicillins  Home Medications   Current Outpatient Rx  Name Route Sig Dispense Refill  . CITALOPRAM HYDROBROMIDE 40 MG PO TABS Oral Take 1 tablet (40 mg total) by mouth daily. For depresion. 30 tablet 0  . GABAPENTIN 600 MG PO TABS Oral Take 1 tablet (600 mg total) by mouth 3 (three) times daily. For anxiety and pain management. May have to stop to take ECT. 90 tablet 0  . HALOPERIDOL 5 MG PO TABS Oral Take 1 tablet (5 mg total) by mouth 2 (two) times  daily. For psychosis 60 tablet 0  . PANTOPRAZOLE SODIUM 40 MG PO TBEC Oral Take 1 tablet (40 mg total) by mouth daily. For control of stomach acid secretion and helps GERD. 30 tablet 0  . SUMATRIPTAN SUCCINATE 100 MG PO TABS Oral Take 1 tablet (100 mg total) by mouth every 2 (two) hours as needed. migranes 10 tablet 0  . ALBUTEROL SULFATE HFA 108 (90 BASE) MCG/ACT IN AERS Inhalation Inhale 2 puffs into the lungs every 4 (four) hours as needed for wheezing or shortness of breath. 1 Inhaler 0  . DEXAMETHASONE 4 MG PO TABS  4 tabs (16 mg) po at once on day one, 4 tabs (16 mg)  po at once on day 2 8 tablet 0  . IBUPROFEN 600 MG PO TABS Oral Take 1 tablet (600 mg total) by mouth every 6 (six) hours as needed for pain. 30 tablet 0  . LUBIPROSTONE 24 MCG PO CAPS Oral Take 1 capsule (24 mcg total) by mouth 2 (two) times daily with a meal. For constipation 60 capsule 0    BP 150/90  Pulse 75  Temp 98.1 F (36.7 C) (Oral)  Resp 18  SpO2 100%  Physical Exam  Nursing note and vitals reviewed. Constitutional: She is oriented to person, place, and time. She appears well-developed and well-nourished.  HENT:  Head: Normocephalic and atraumatic.  Nose: Nose normal. Right sinus exhibits no maxillary sinus tenderness and no frontal sinus tenderness. Left sinus exhibits no maxillary sinus tenderness and no frontal sinus tenderness.  Mouth/Throat: Uvula is midline and oropharynx is clear and moist. No oropharyngeal exudate.  Eyes: Conjunctivae and EOM are normal. Pupils are equal, round, and reactive to light.  Neck: Normal range of motion.  Cardiovascular: Normal rate, regular rhythm, normal heart sounds and intact distal pulses.   No murmur heard. Pulmonary/Chest: Effort normal and breath sounds normal. She exhibits no tenderness.       Right-sided chest wall tenderness, upper abdominal tenderness.  Abdominal: Soft. Bowel sounds are normal. She exhibits no distension. There is no splenomegaly. There is no tenderness. There is no rebound, no guarding and no CVA tenderness.  Musculoskeletal: Normal range of motion. She exhibits no edema and no tenderness.  Lymphadenopathy:    She has no cervical adenopathy.  Neurological: She is alert and oriented to person, place, and time.  Skin: Skin is warm and dry.  Psychiatric: She has a normal mood and affect. Her behavior is normal. Judgment and thought content normal.    ED Course  Procedures (including critical care time)  Labs Reviewed - No data to display No results found.   1. Cough   2. Fatigue     MDM  Patient is  likely still recovering from her recent illness. No change in medication, although she is on multiple medications that cause excessive sedation. Will have her discontinue the ones that are PRN, and talk to her doctor about having some of them adjusted. She does not seem to be in a major depression at this time. There is no evidence of new infection that could be causing her symptoms. No historical evidence that would suggest anemia. She does have some chest wall tenderness, no history of asthma. Given the recent respiratory illness, will have her restart her albuterol a scheduled basis, and give her 2 days of dexamethasone. Discussed with her signs and symptoms that should prompt return. She and her mother agree with plan.  Luiz Blare, MD 10/02/11 1407

## 2011-10-02 NOTE — ED Notes (Signed)
Pt with c/o cough and congestion/fatigue treated with z pack completed x 6 days ago - now pt with c/o increased fatigue sleeping all the time - right sided rib pain - dry cough

## 2011-10-12 ENCOUNTER — Other Ambulatory Visit: Payer: Self-pay | Admitting: Urology

## 2011-10-13 ENCOUNTER — Other Ambulatory Visit: Payer: Self-pay | Admitting: Urology

## 2011-10-15 ENCOUNTER — Encounter (HOSPITAL_COMMUNITY): Admission: RE | Payer: Self-pay | Source: Ambulatory Visit

## 2011-10-15 ENCOUNTER — Ambulatory Visit (HOSPITAL_COMMUNITY): Admission: RE | Admit: 2011-10-15 | Payer: Medicare Other | Source: Ambulatory Visit | Admitting: Urology

## 2011-10-15 SURGERY — CYSTOSCOPY
Anesthesia: General

## 2011-10-28 IMAGING — CT CT HEAD W/O CM
1 series · 16 of 30 positions shown, 20 images · non-contrast
Comparison: 03/03/2010

CLINICAL DATA: Syncope

CT HEAD WITHOUT CONTRAST
TECHNIQUE: Contiguous axial images were obtained from the base of
the skull through the vertex without contrast.

[Series 2: head 4.8 h37s · axial · 0.48mm/px · z∈[+1299,+1455]mm · 16 of 36 slices shown, 20 images]
[im 2/36  brain]
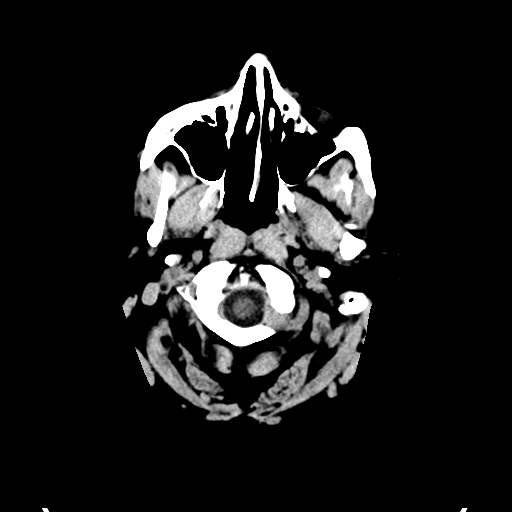
[im 2/36  bone]
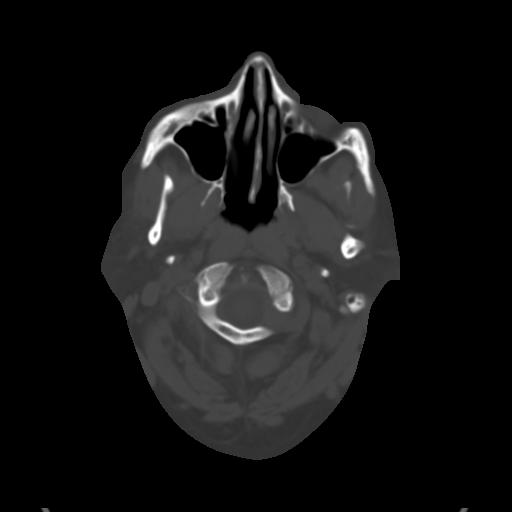
[im 4/36  brain]
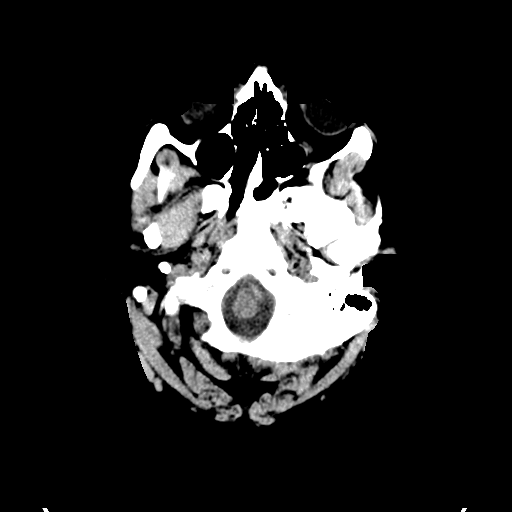
[im 7/36  brain]
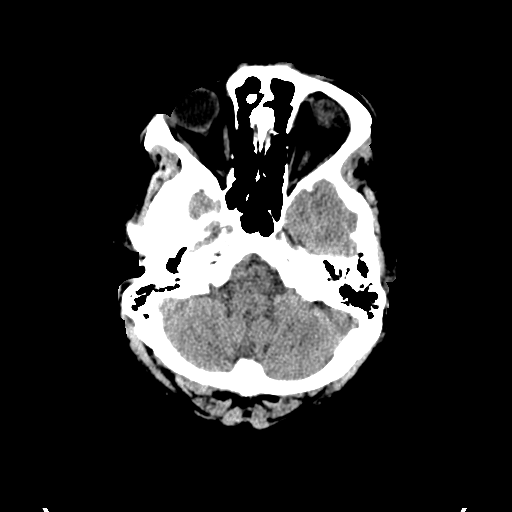
[im 9/36  brain]
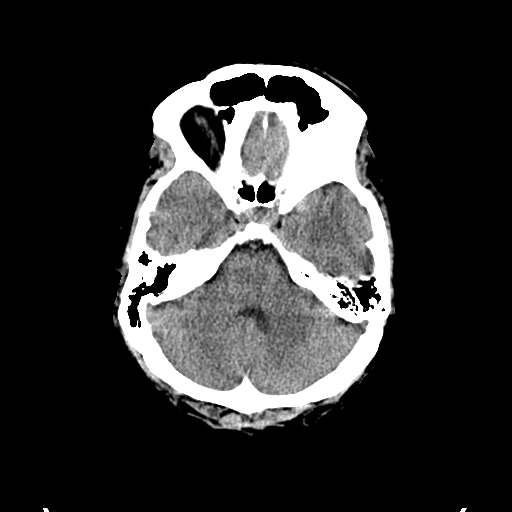
[im 10/36  brain]
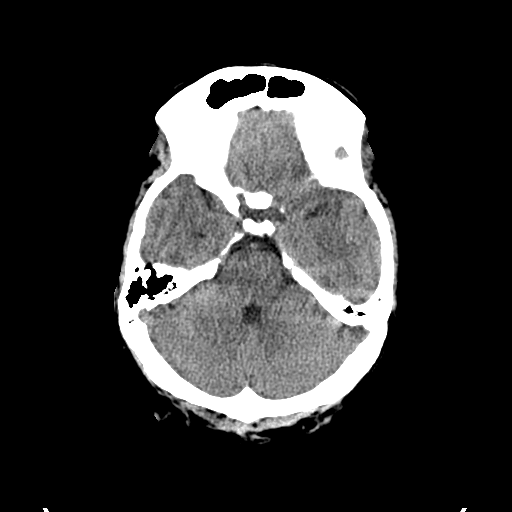
[im 10/36  bone]
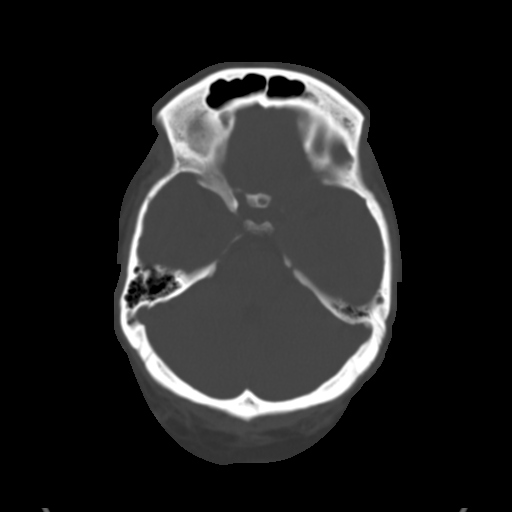
[im 13/36  brain]
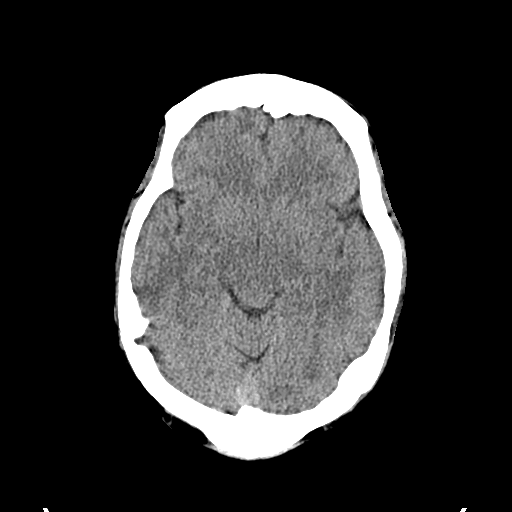
[im 15/36  brain]
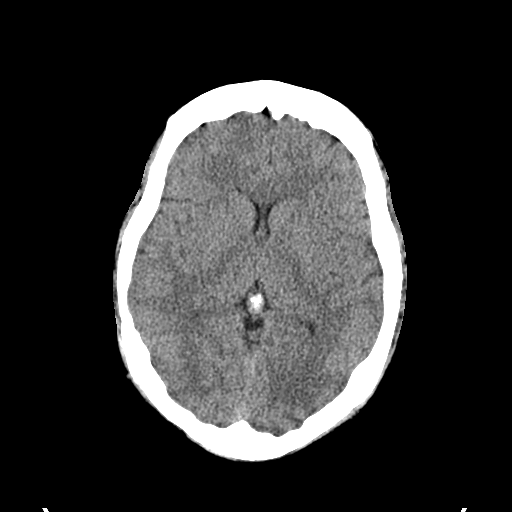
[im 17/36  brain]
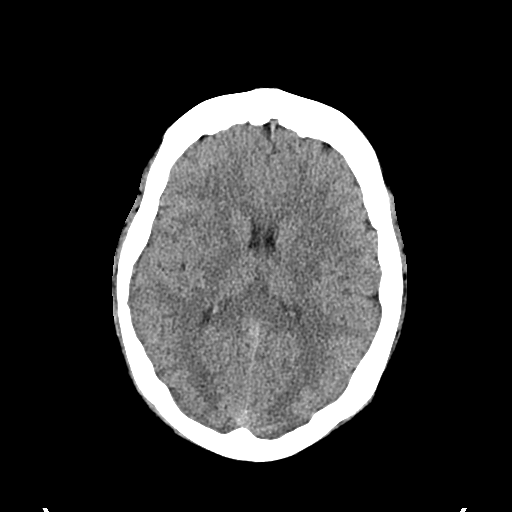
[im 19/36  brain]
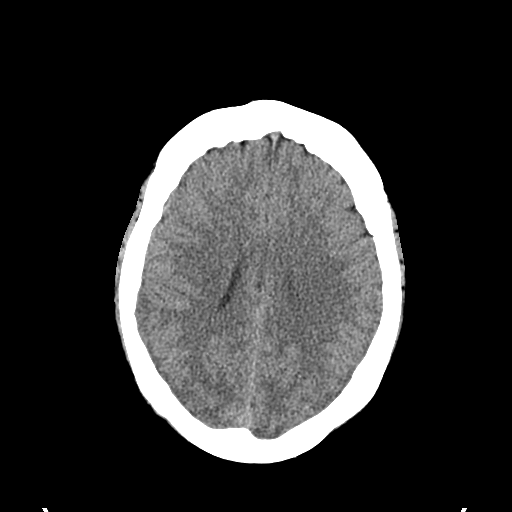
[im 19/36  bone]
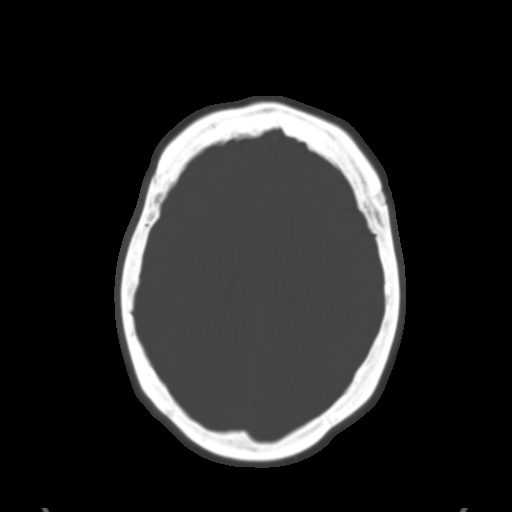
[im 21/36  brain]
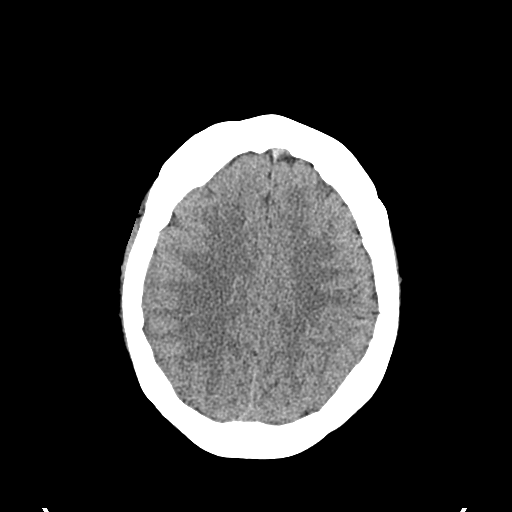
[im 23/36  brain]
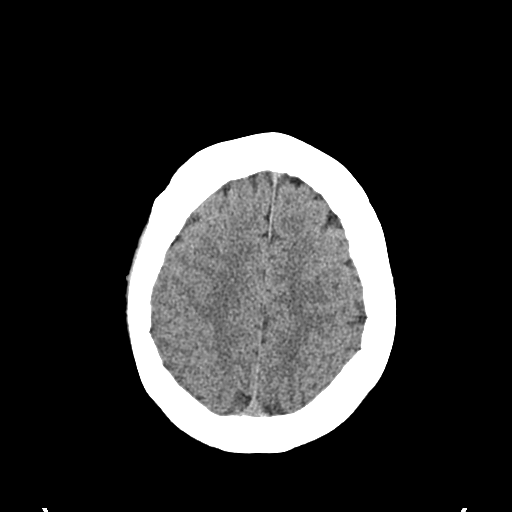
[im 26/36  brain]
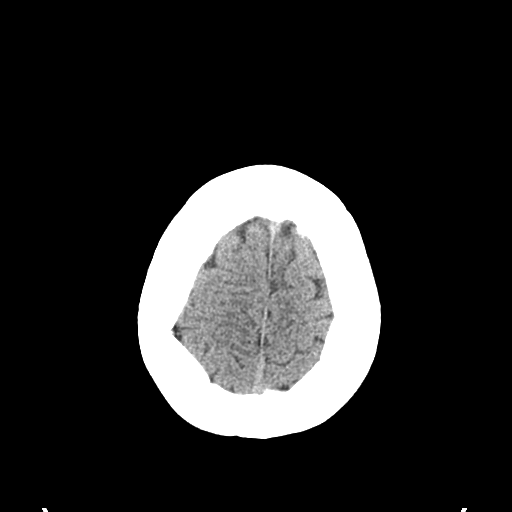
[im 27/36  brain]
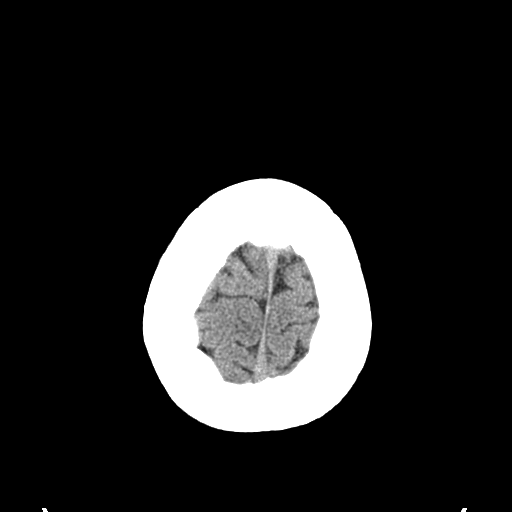
[im 27/36  bone]
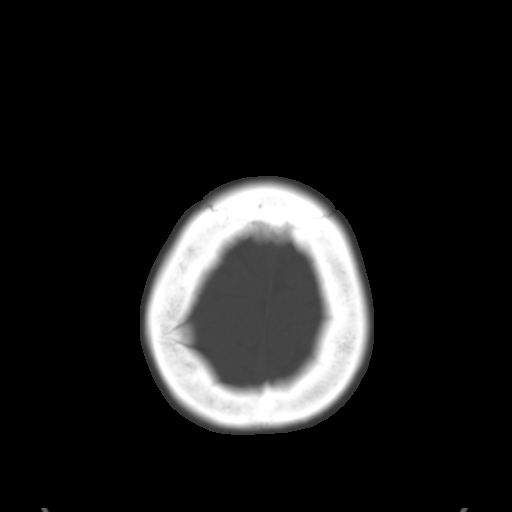
[im 29/36  brain]
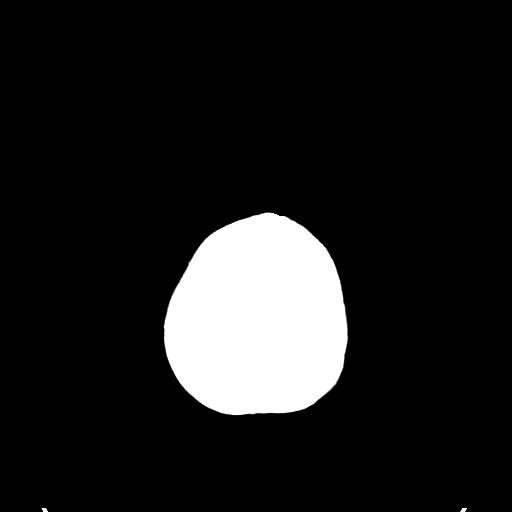
[im 32/36  brain]
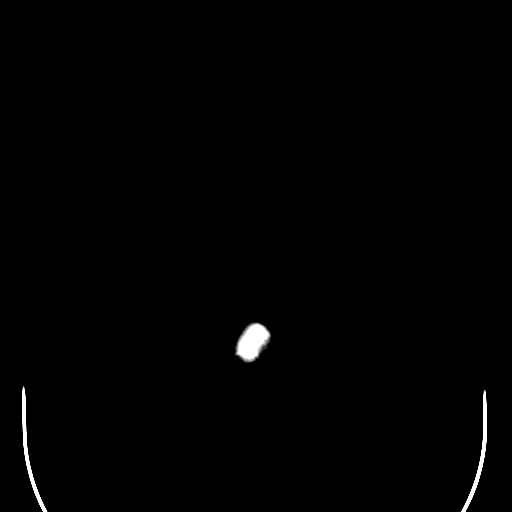
[im 34/36  brain]
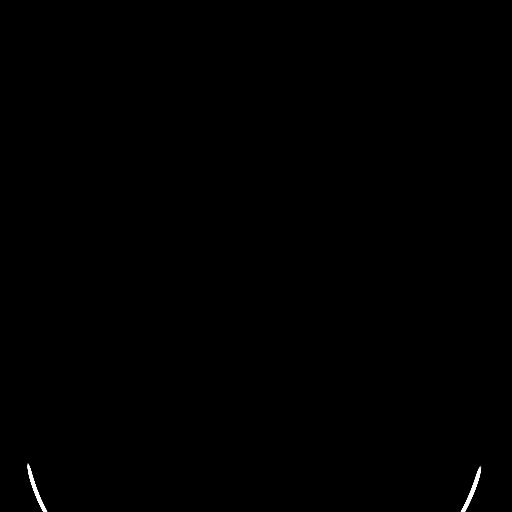

[16 of 30 positions shown; findings below may reference images not displayed]

FINDINGS: No intracranial hemorrhage or extra-axial fluid
collections.  No mass lesion, mass effect, or midline shift.

No CT evidence of acute infarction.

The ventricles are normal in size.

The visualized paranasal sinuses and mastoids are clear.

No evidence of calvarial fracture.
IMPRESSION: No acute intracranial abnormality.

## 2011-10-28 IMAGING — CR DG CHEST 2V
2 series · 2 of 2 positions shown · non-contrast
Comparison: Chest x-ray of 03/04/2010

CLINICAL DATA: Syncope, hypoglycemia, recent fall

CHEST - 2 VIEW

[w chest pa]
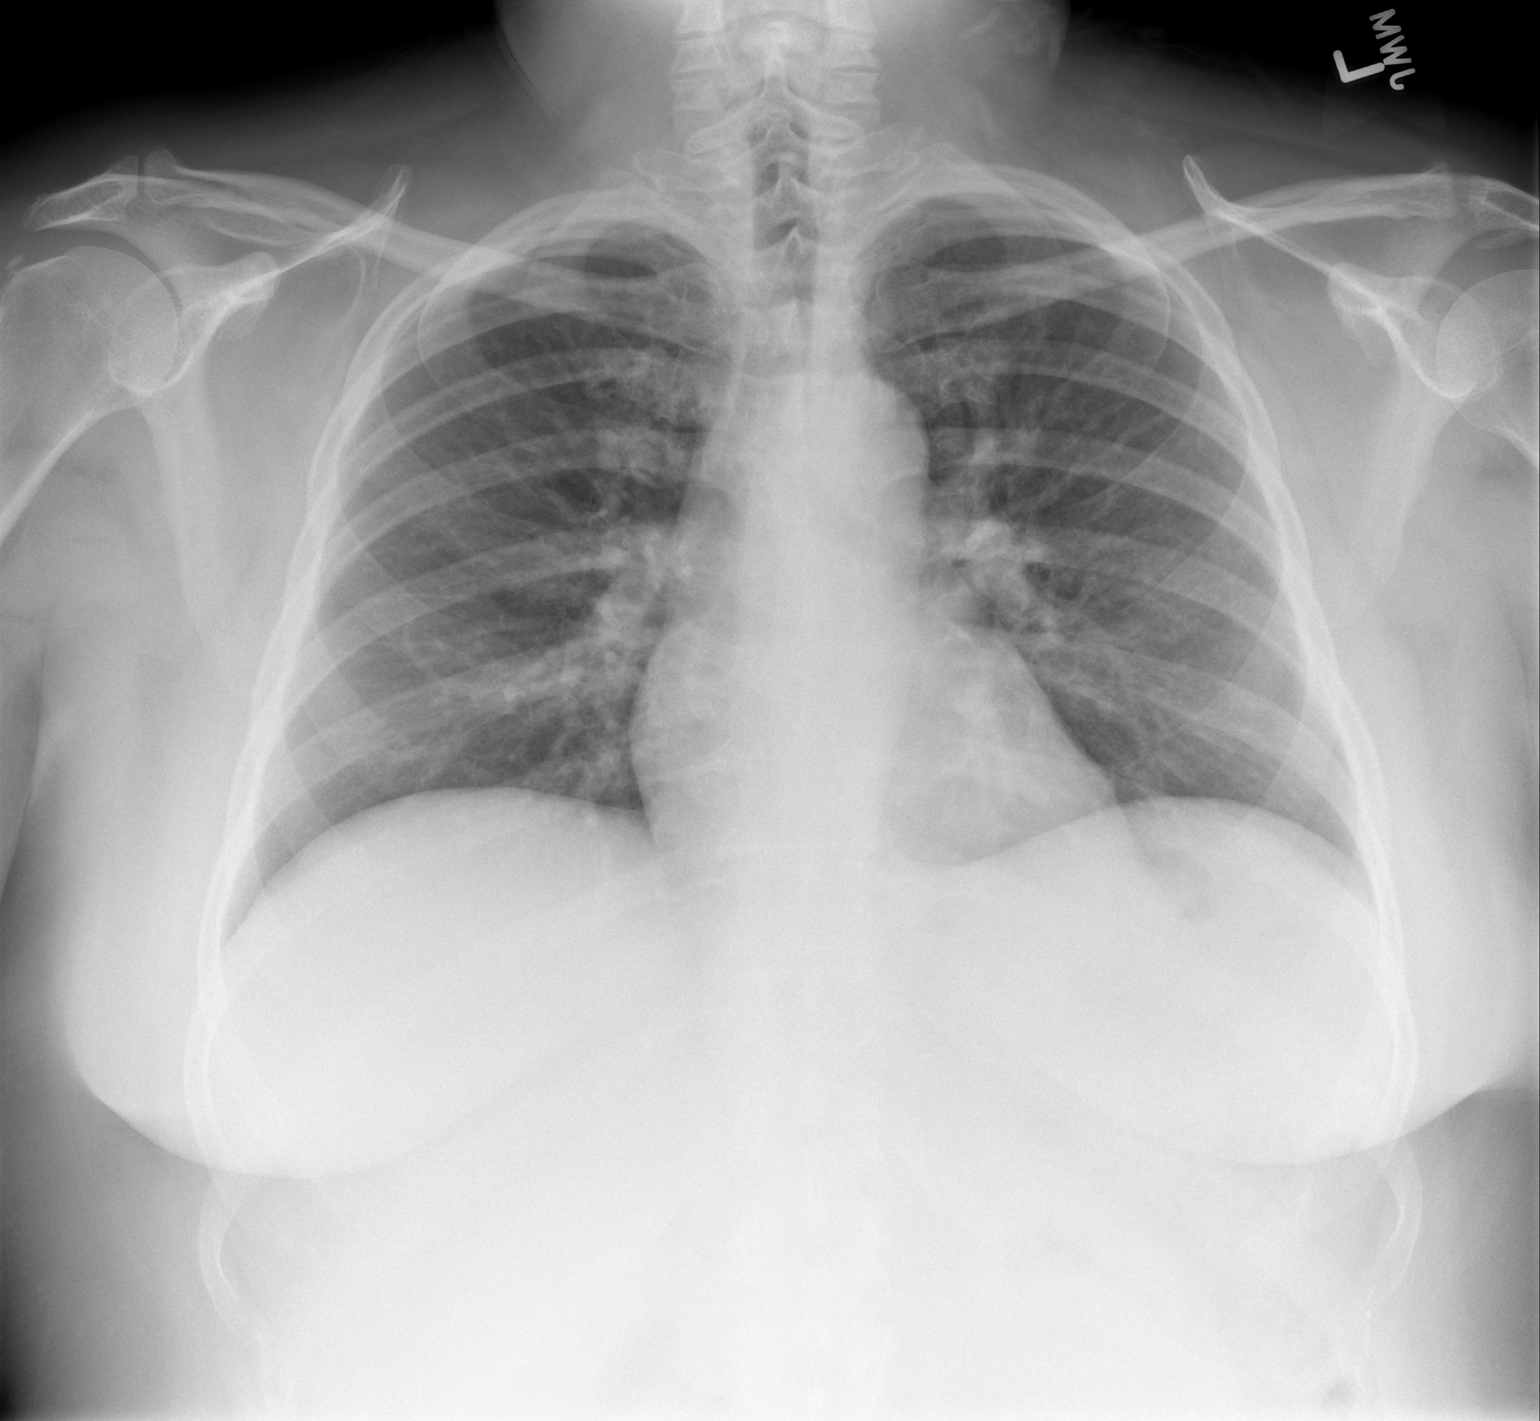

[w chest lat]
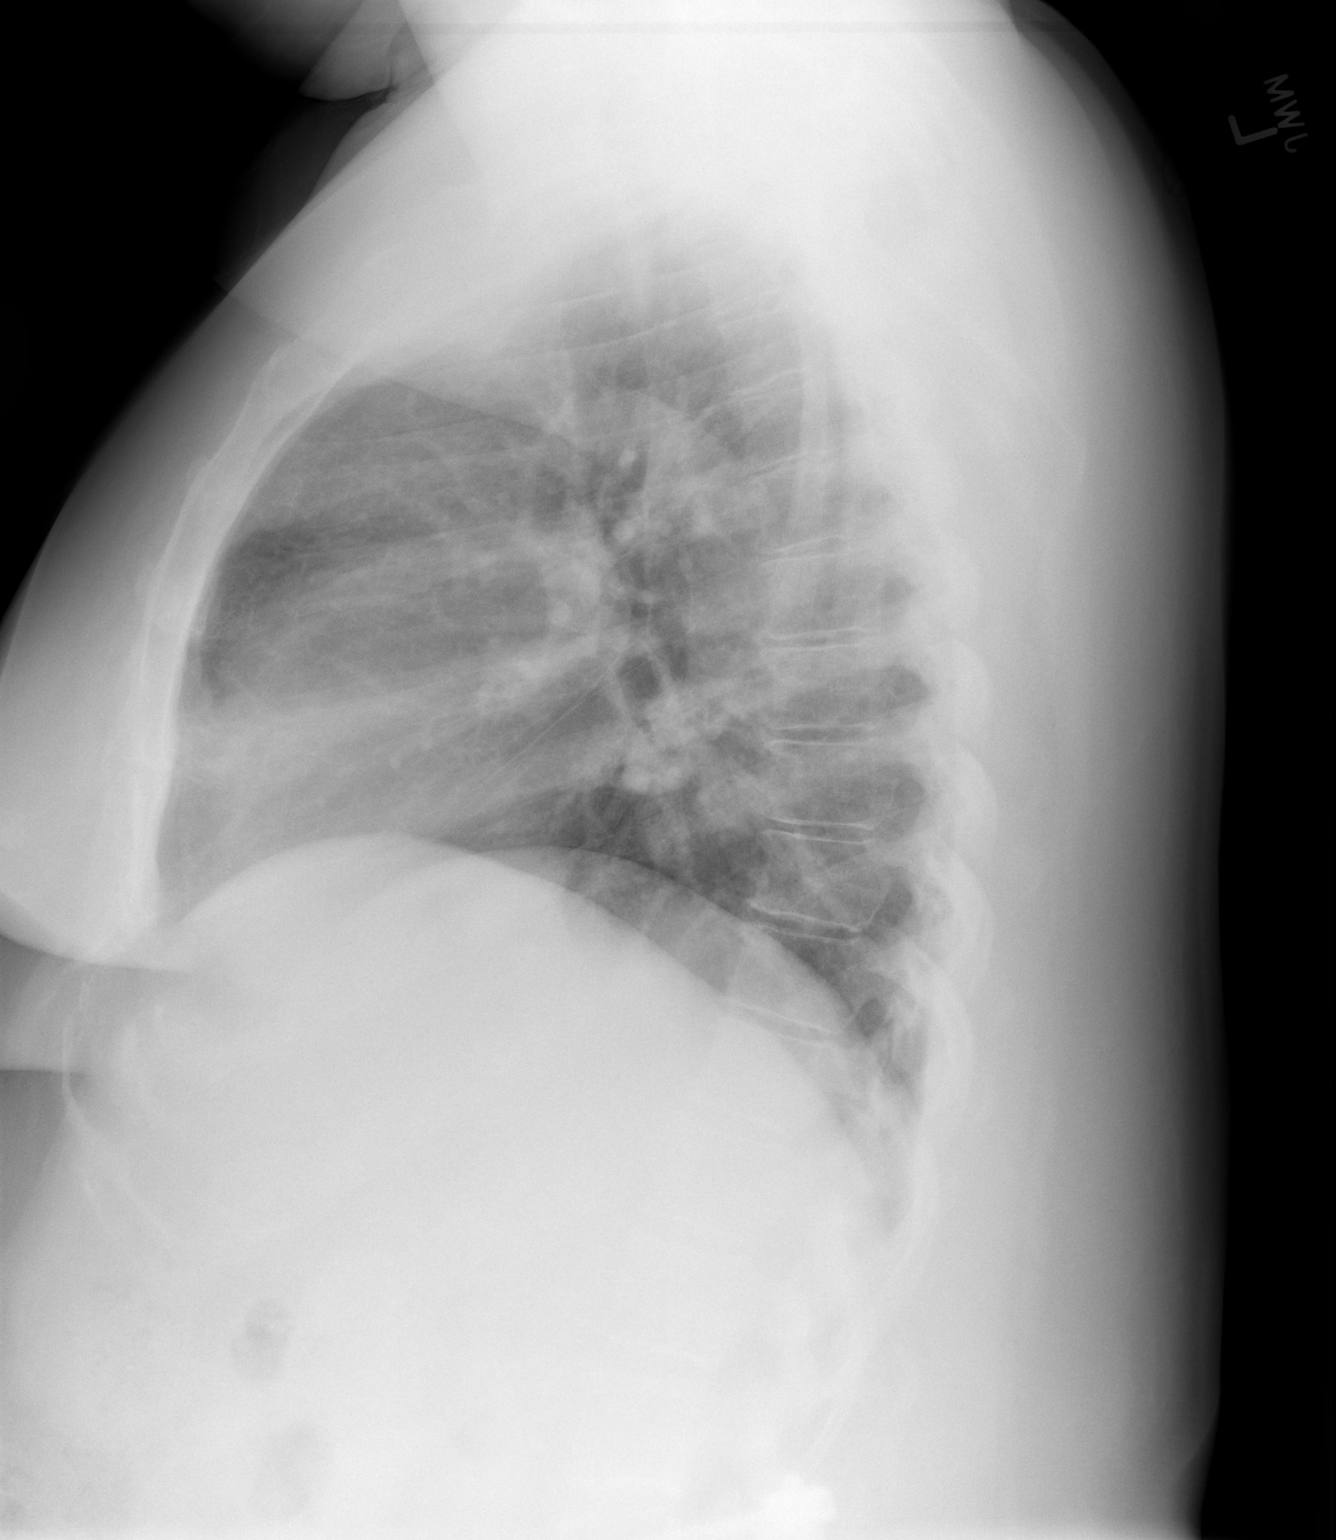

[2 of 2 positions shown; findings below may reference images not displayed]

FINDINGS: No active infiltrate or effusion is seen.  Mediastinal
contours appear stable.  The heart is within normal limits in size.
No bony abnormality is seen.
IMPRESSION: Stable chest x-ray.  No active lung disease.

## 2011-10-28 IMAGING — CR DG CERVICAL SPINE COMPLETE 4+V
6 series · 6 of 6 positions shown · non-contrast
Comparison: CT of the cervical spine of 03/03/2010

CLINICAL DATA: The peak, fell

CERVICAL SPINE - COMPLETE 4+ VIEW

[w c-spine lat *]
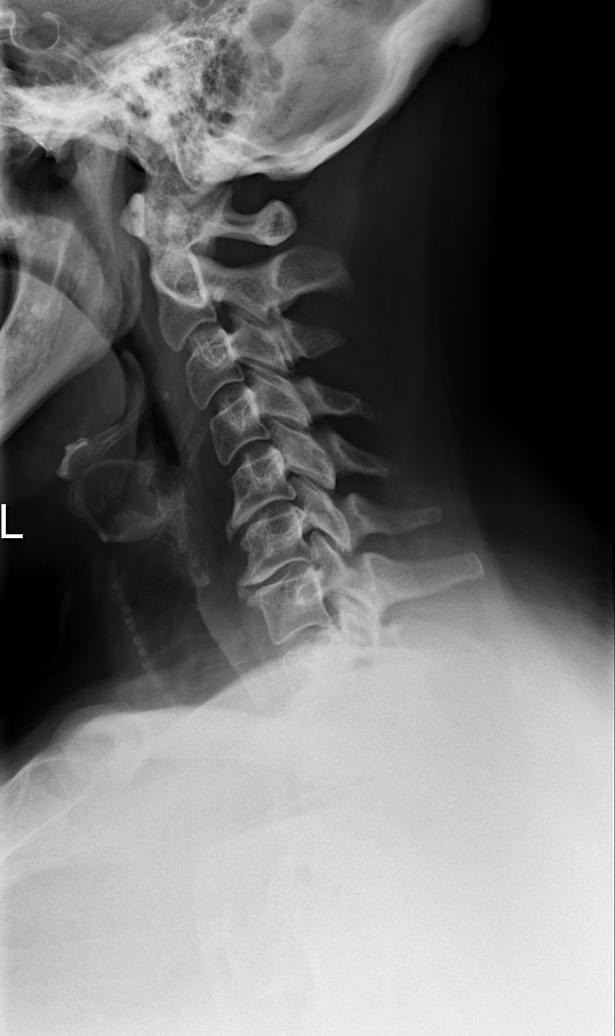

[w c-spine oblique (1 of 2)]
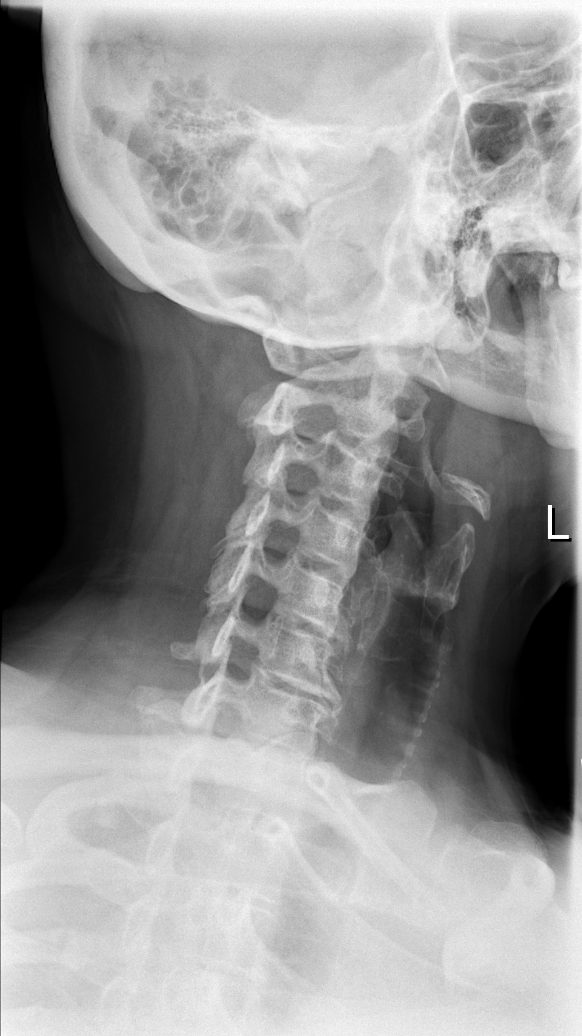

[w c-spine oblique (2 of 2)]
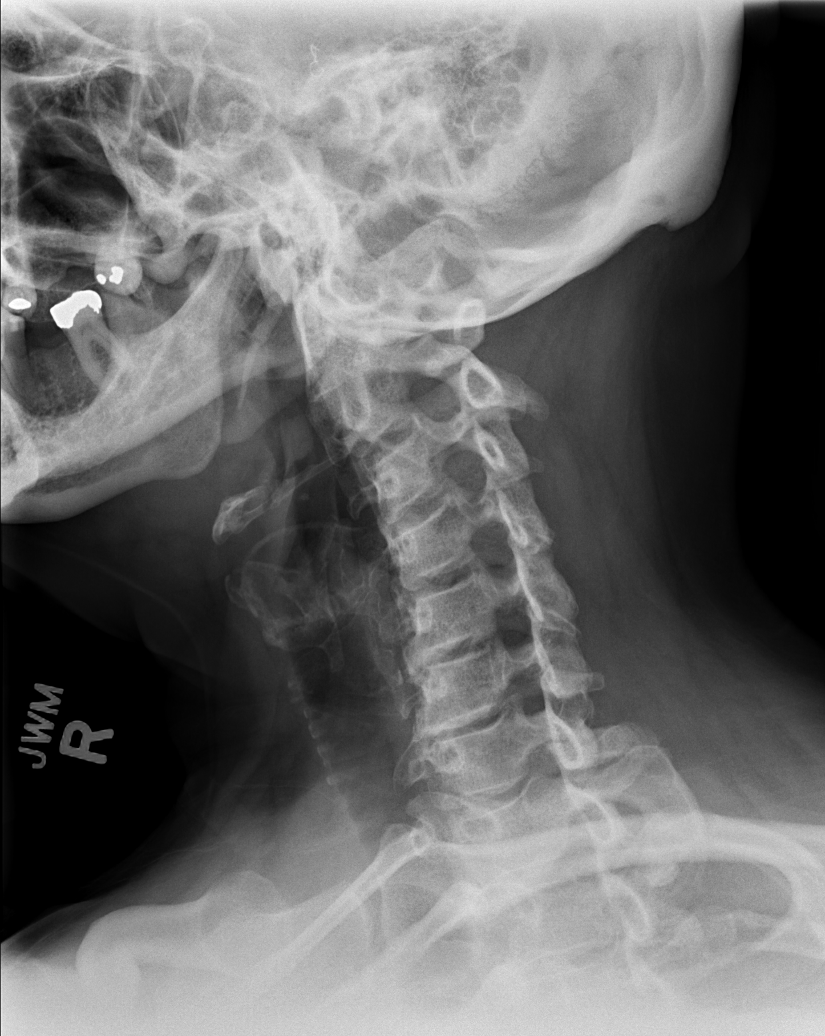

[w c-spine a.p.]
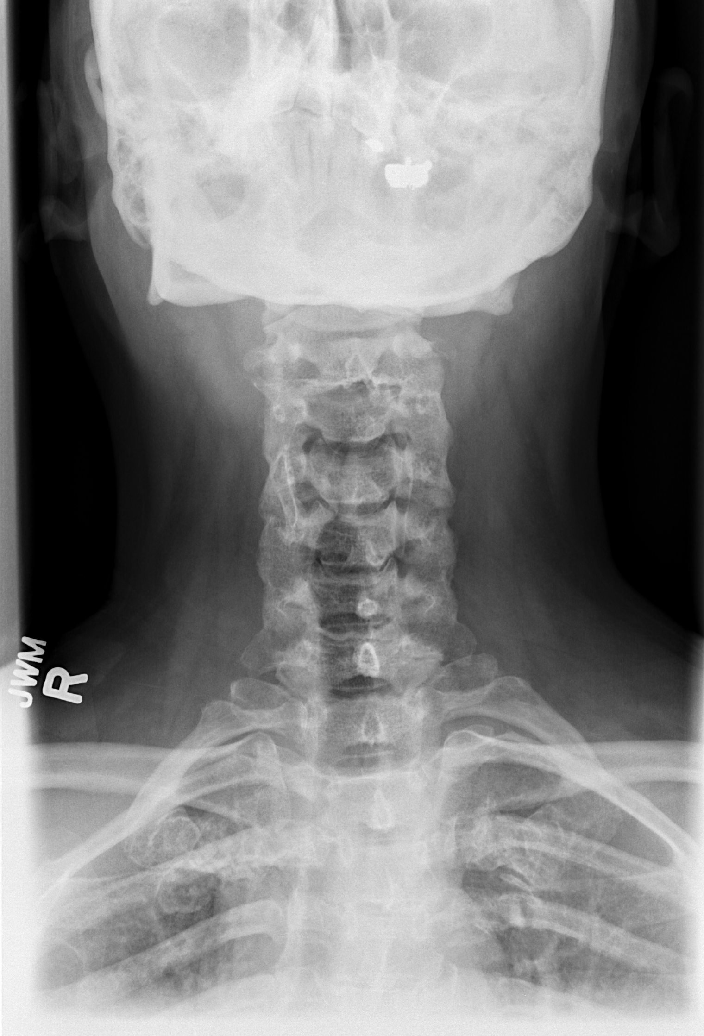

[w c-spine odontoid (1 of 2)]
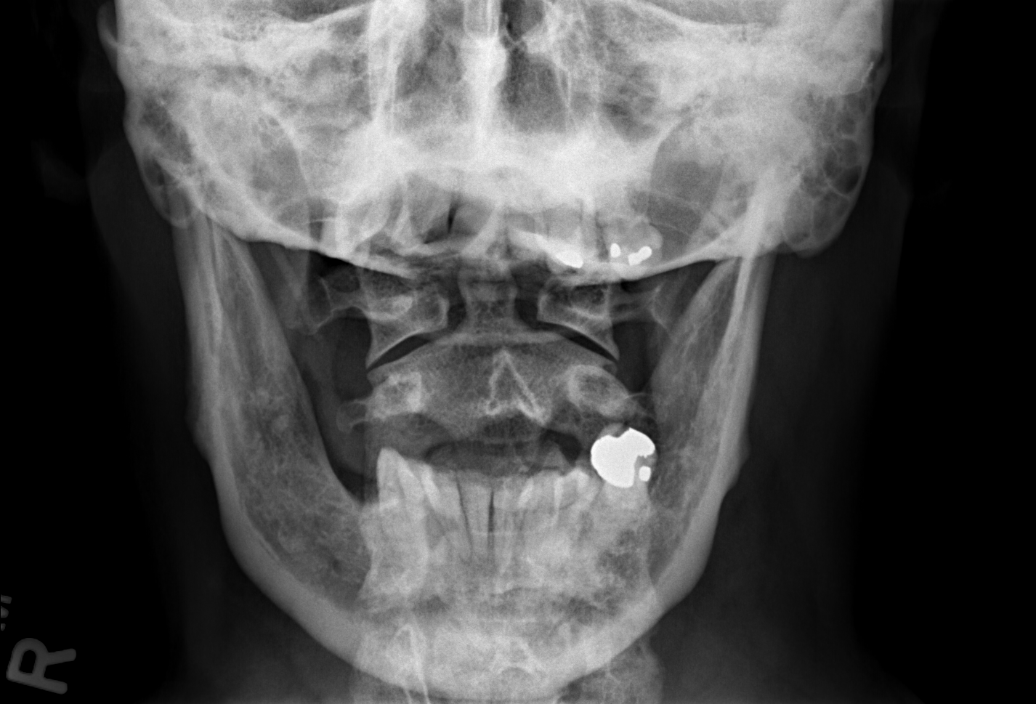

[w c-spine odontoid (2 of 2)]
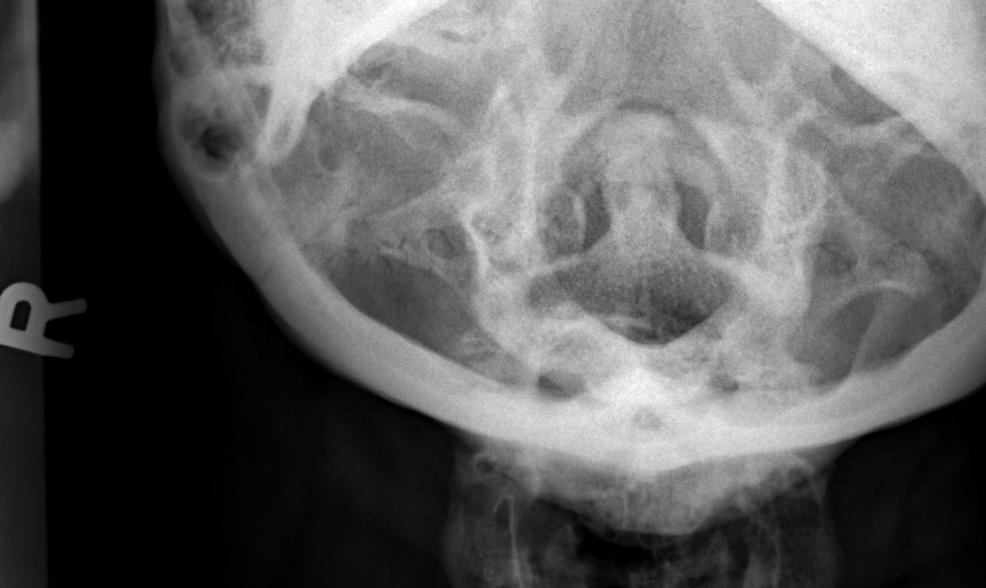

[6 of 6 positions shown; findings below may reference images not displayed]

FINDINGS: The cervical vertebrae remain straightened in alignment.
Degenerative disc disease is present particularly at C5-6 and C6-7
levels.  No prevertebral soft tissue swelling is seen.  The
foramina are patent.  The odontoid process is intact.
IMPRESSION: Straightened alignment with degenerative disc disease at C5-6 and
C6-7.  No fracture is seen.

## 2011-10-28 IMAGING — CR DG LUMBAR SPINE COMPLETE 4+V
5 series · 5 of 5 positions shown · non-contrast
Comparison: Lumbar spine films of 01/21/2010

CLINICAL DATA: Syncope, recent fall

LUMBAR SPINE - COMPLETE 4+ VIEW

[t l-spine a.p.]
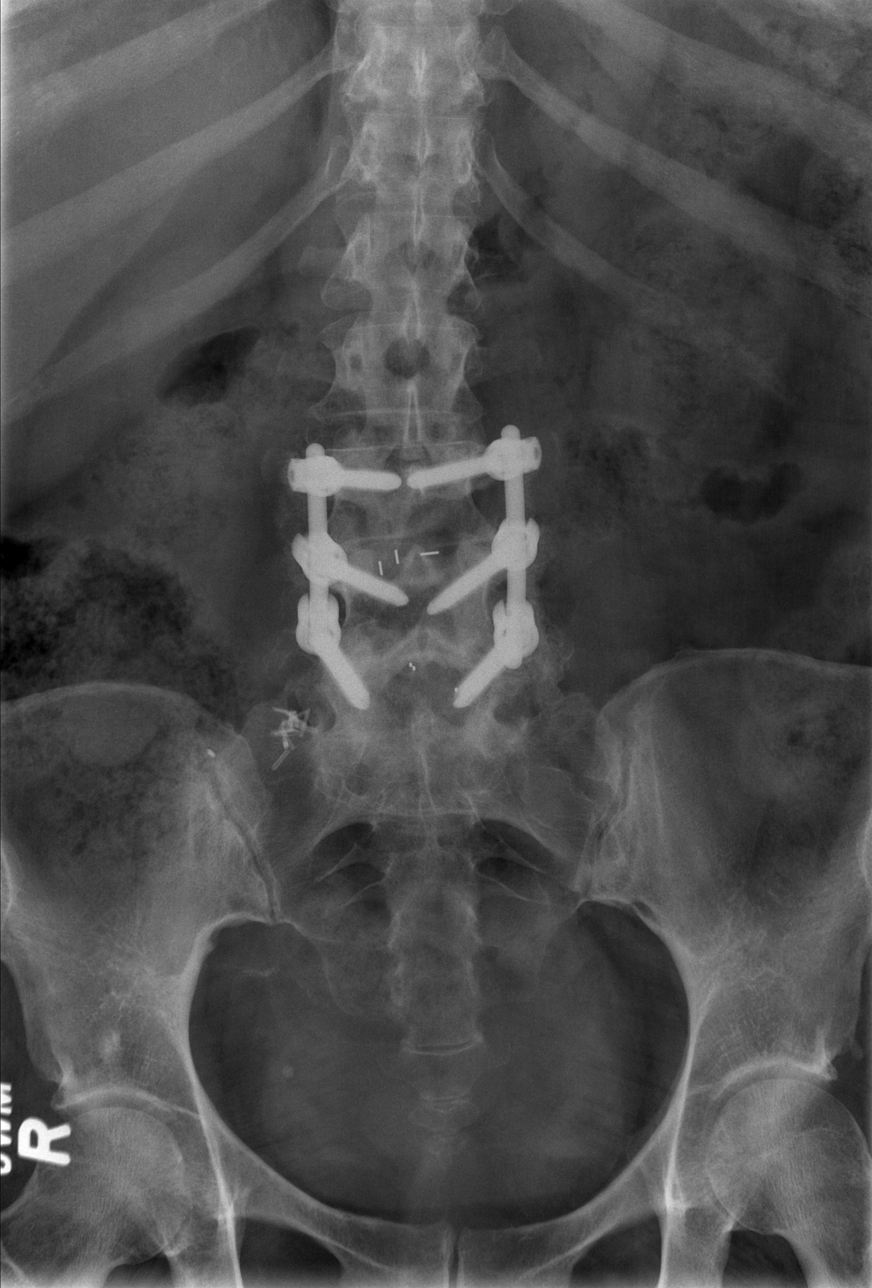

[t l-spine oblique exposure (1 of 2)]
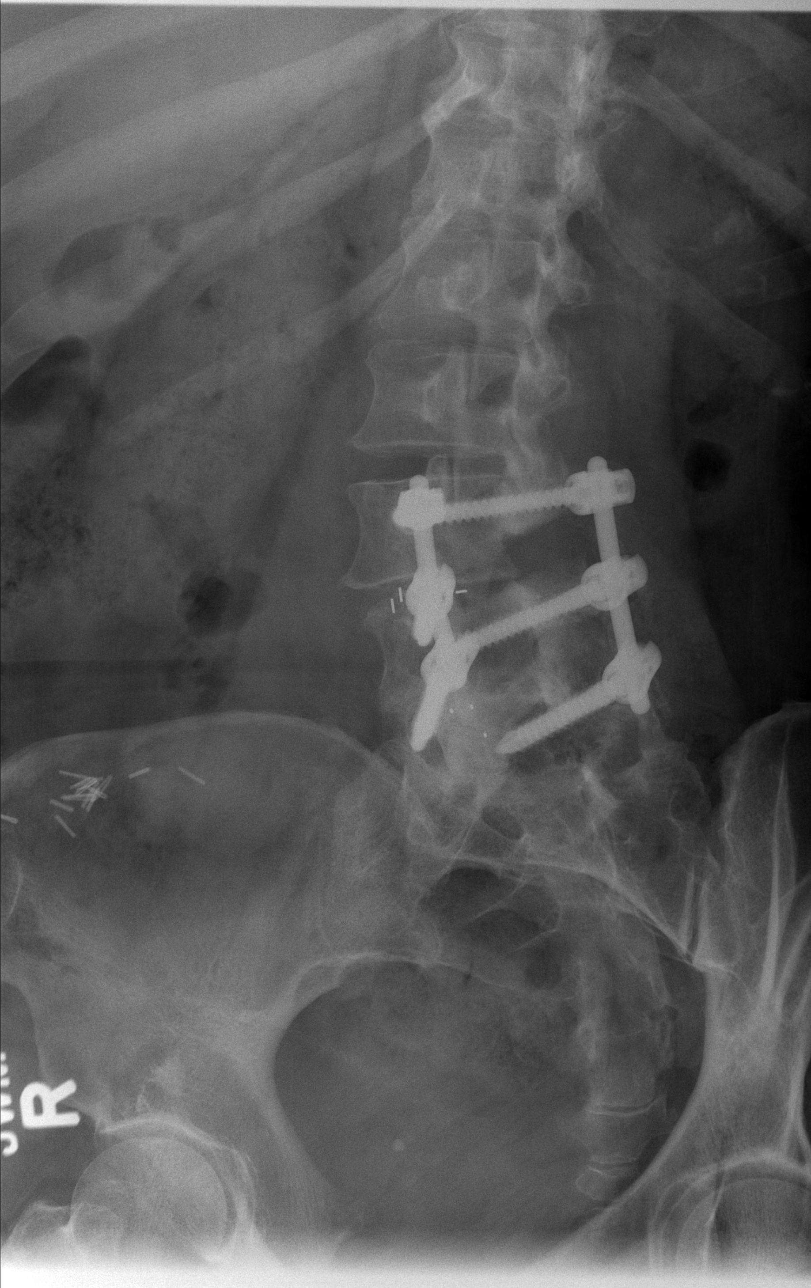

[t l-spine oblique exposure (2 of 2)]
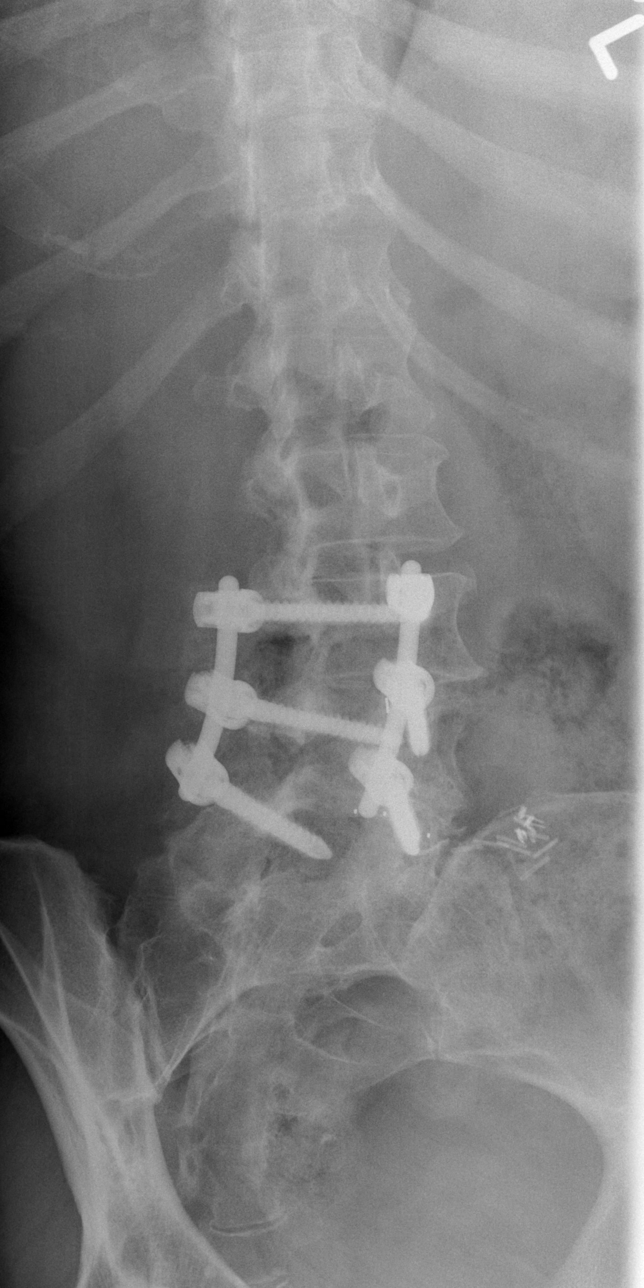

[t l-spine lat]
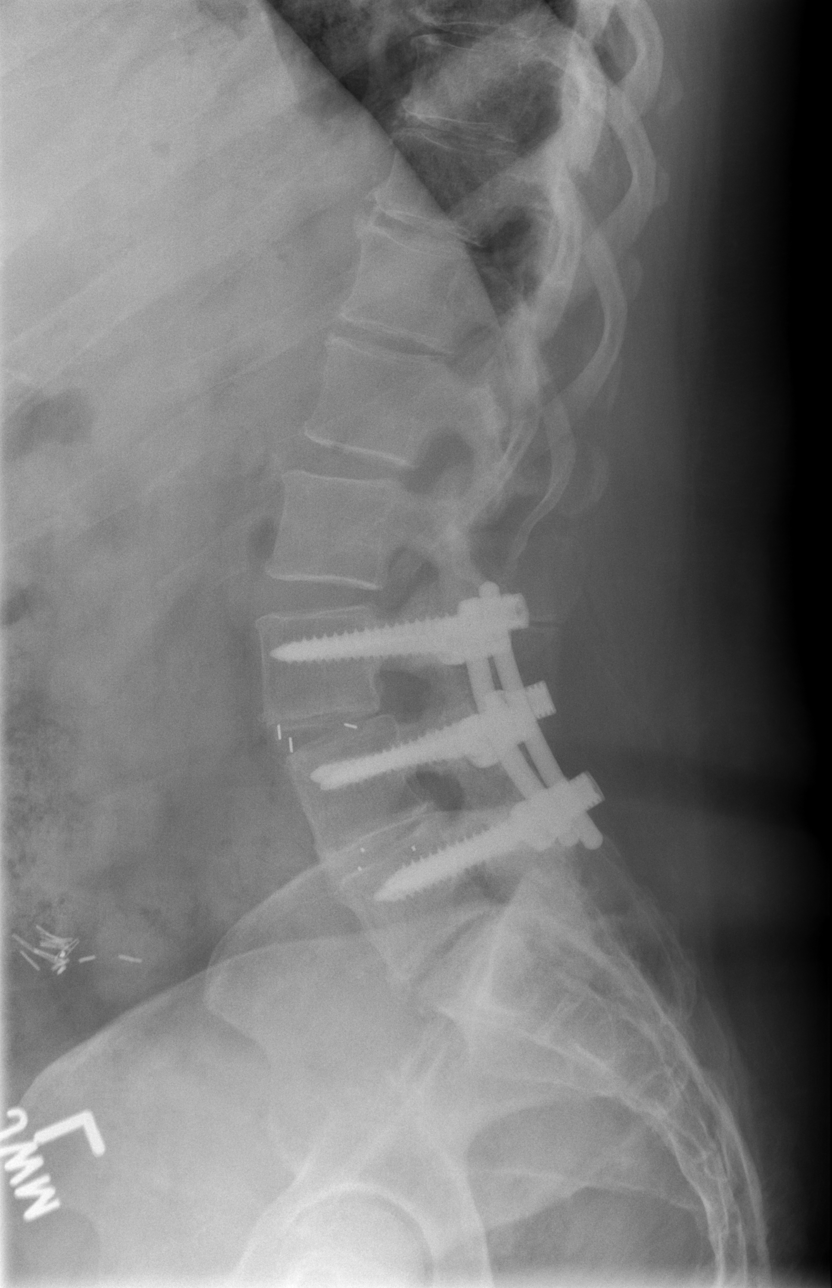

[t l-spine l5-s1 spot]
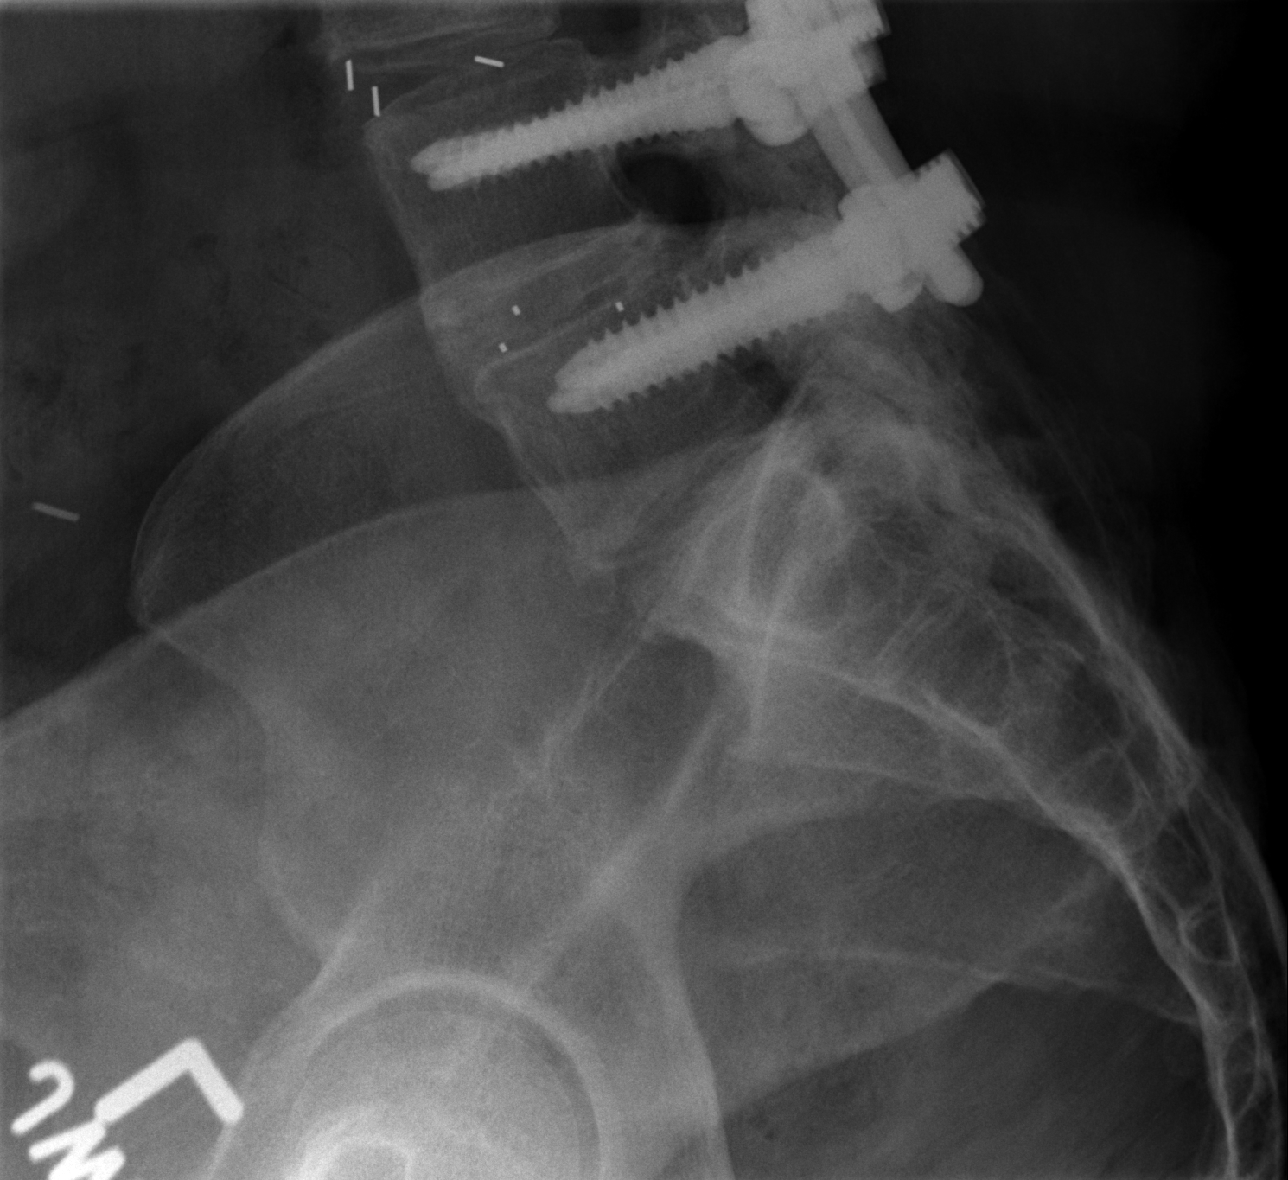

[5 of 5 positions shown; findings below may reference images not displayed]

FINDINGS: Hardware for posterior fusion from L3-L5 is noted.
Interbody fusion plugs at L3-4 and L4-5 appear to be in good
position.  There is only minimal anterolisthesis of L3 on L4 by
approximately 3 mm.  No compression deformity is seen.  The SI
joints appear normal.
IMPRESSION: Posterior fusion from L3-L5.  Minimal anterolisthesis of L3 on L4
by 3 mm.  No acute abnormality.

## 2011-12-23 IMAGING — CR DG ABDOMEN ACUTE W/ 1V CHEST
1 series · 1 of 1 positions shown · non-contrast
Comparison: 07/30/2010 and earlier.

CLINICAL DATA: 44-year-old female with abdominal pain, vomiting,
syncope.

ACUTE ABDOMEN SERIES (ABDOMEN 2 VIEW & CHEST 1 VIEW)

[view not recorded]
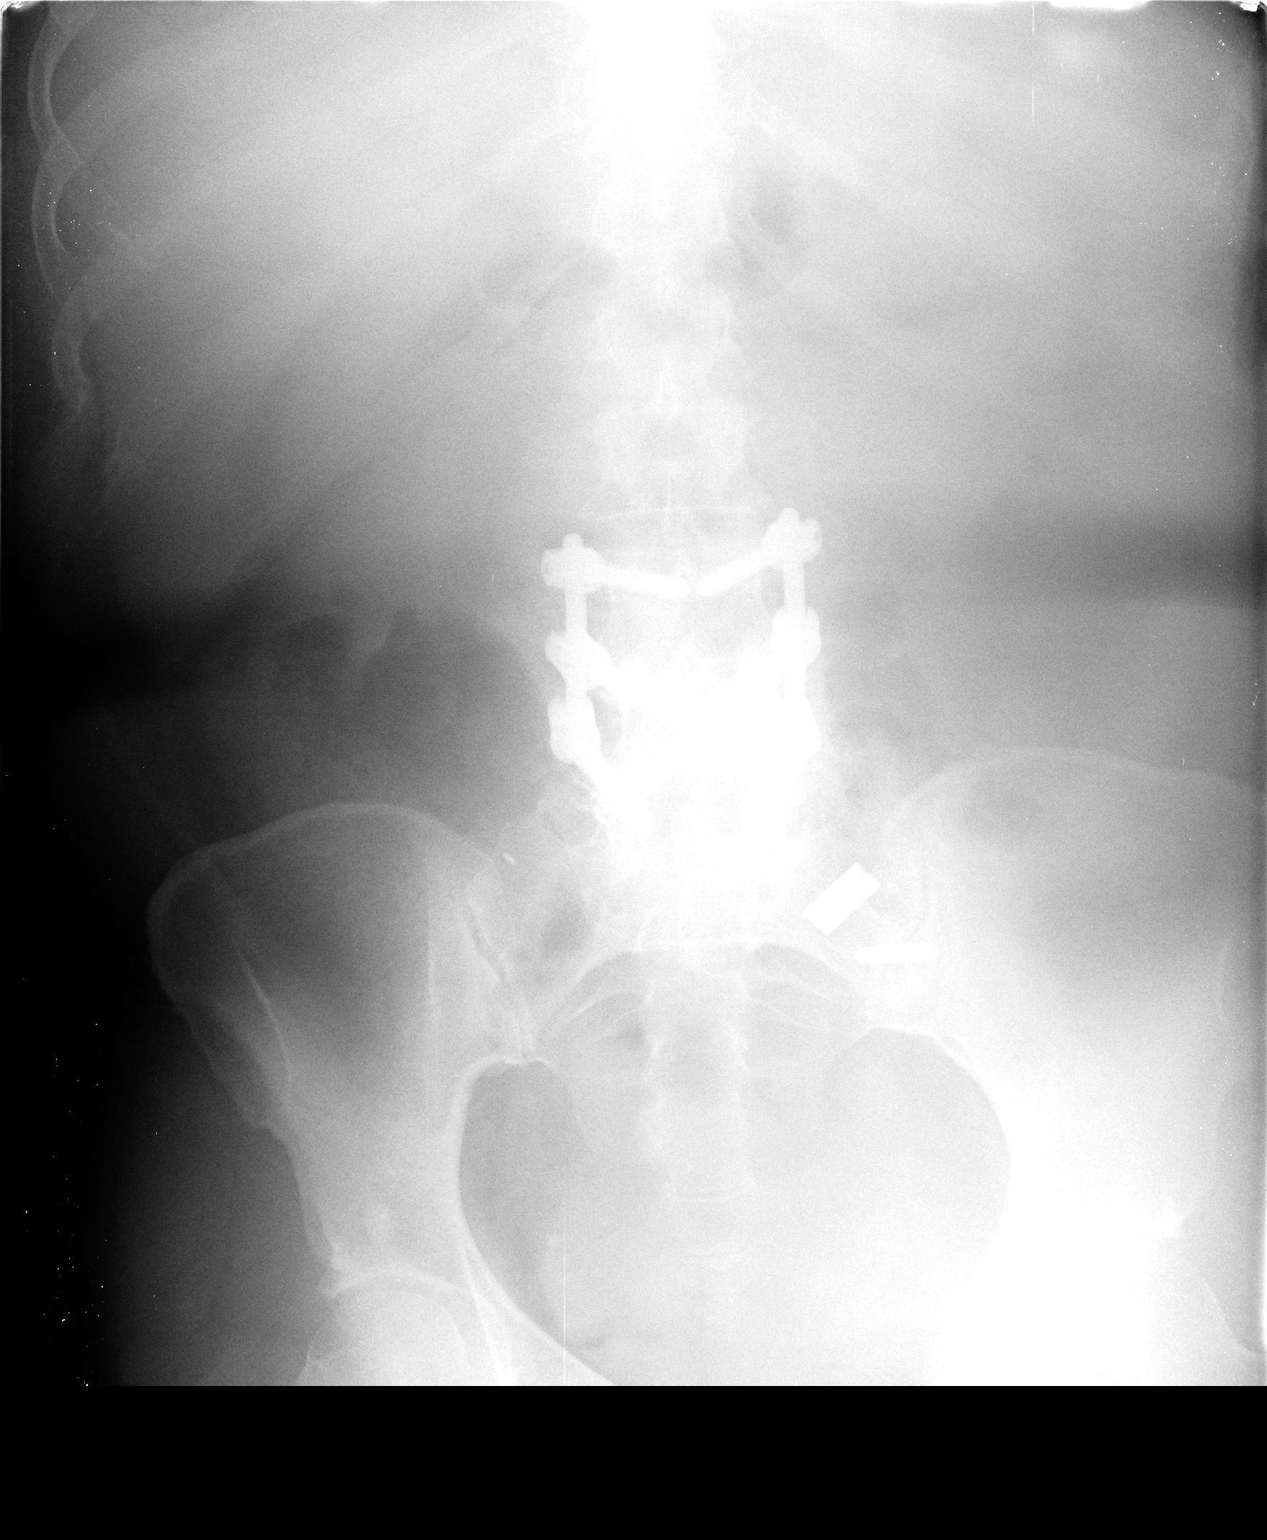

[1 of 1 positions shown; findings below may reference images not displayed]

FINDINGS: Stable, normal lung volumes. Normal cardiac size and
mediastinal contours.  Visualized tracheal air column is within
normal limits.  The lungs are clear.  No pneumothorax or
pneumoperitoneum.

Lumbar fusion changes again noted.  Tubal ligation tight clips in
the pelvis are new from the prior study.  Slight paucity of gas,
but no dilated loops identified.  Large body habitus.  Decreased
bone detail. No acute osseous abnormality identified.
IMPRESSION: Nonobstructed bowel gas pattern, no free air.  Negative chest.

## 2011-12-31 IMAGING — US US ABDOMEN COMPLETE
1 series · 13 of 25 positions shown · non-contrast
Comparison: [HOSPITAL] acute abdominal radiographs
09/24/2010 and [HOSPITAL] abdominal pelvic CT 08/16/2009.

CLINICAL DATA: Right upper quadrant abdominal pain, nausea,
vomiting for 2 weeks.  Hypertension.

COMPLETE ABDOMINAL ULTRASOUND

[Series 1: us abdomen complete · 0.33mm/px · 13 of 60 slices shown]
[im 1/60]
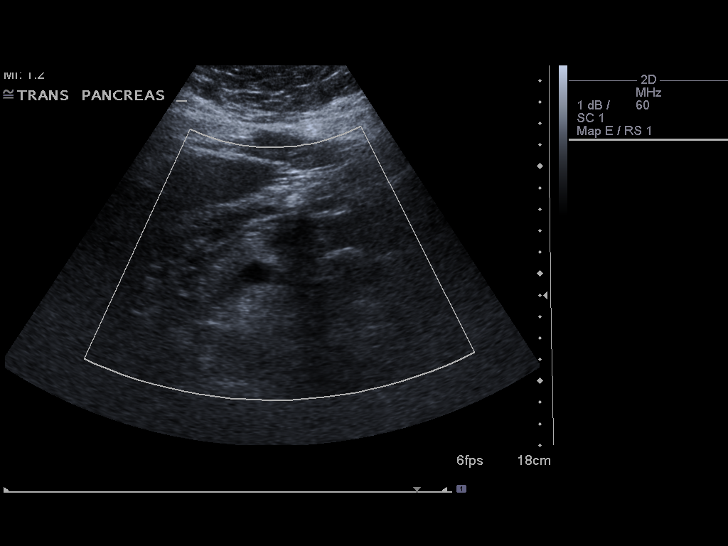
[im 5/60]
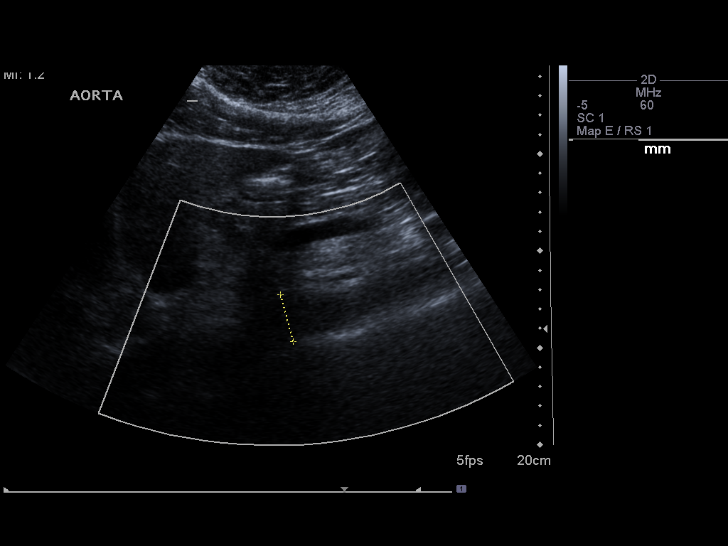
[im 10/60]
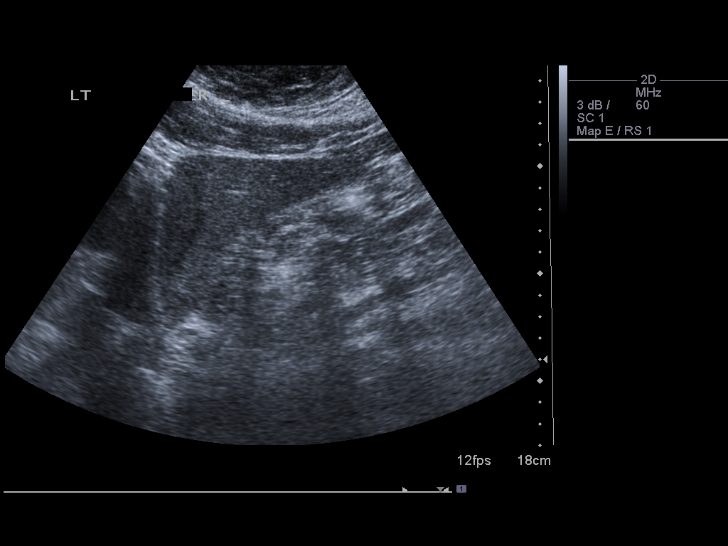
[im 15/60]
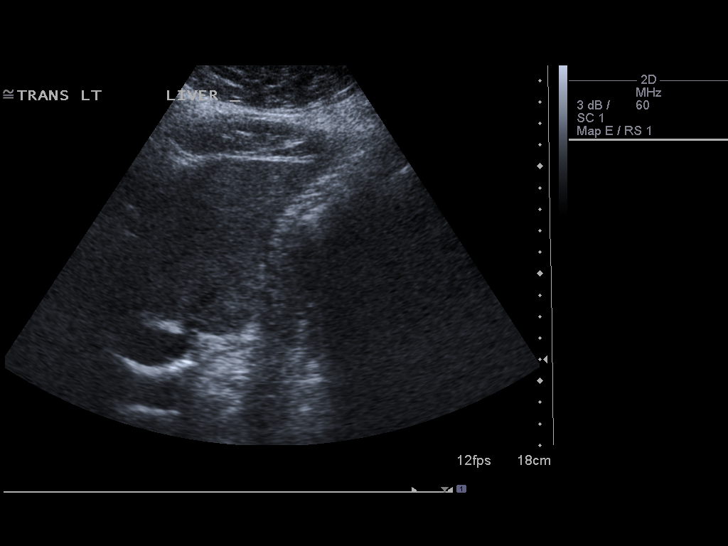
[im 20/60]
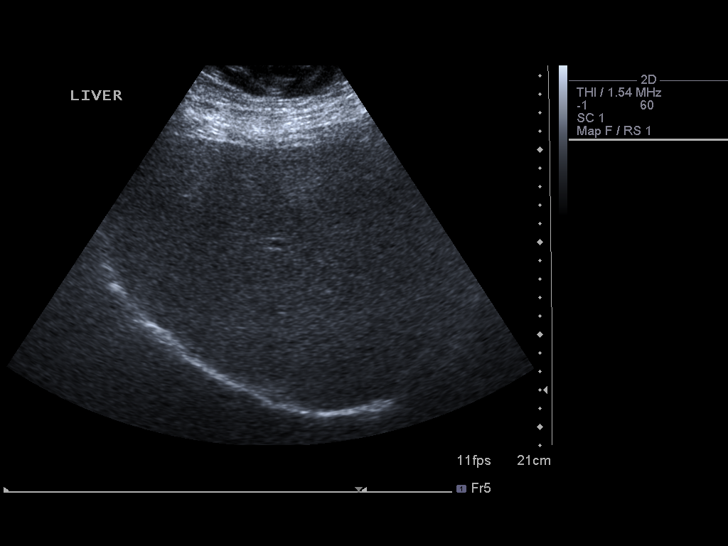
[im 25/60]
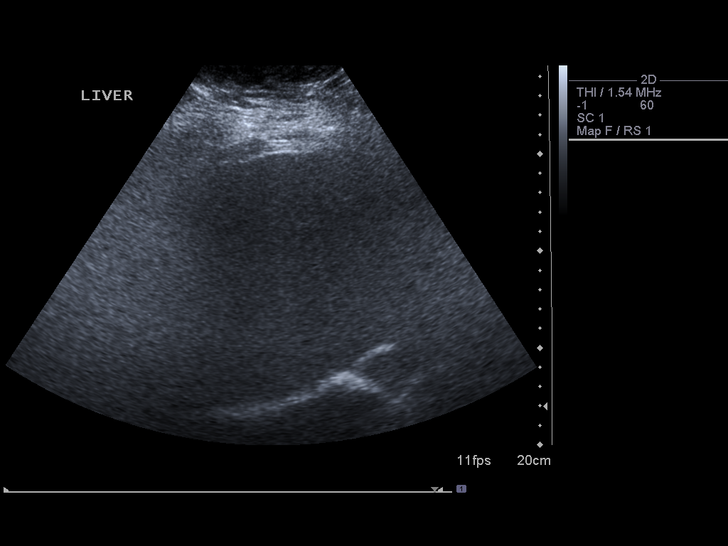
[im 30/60]
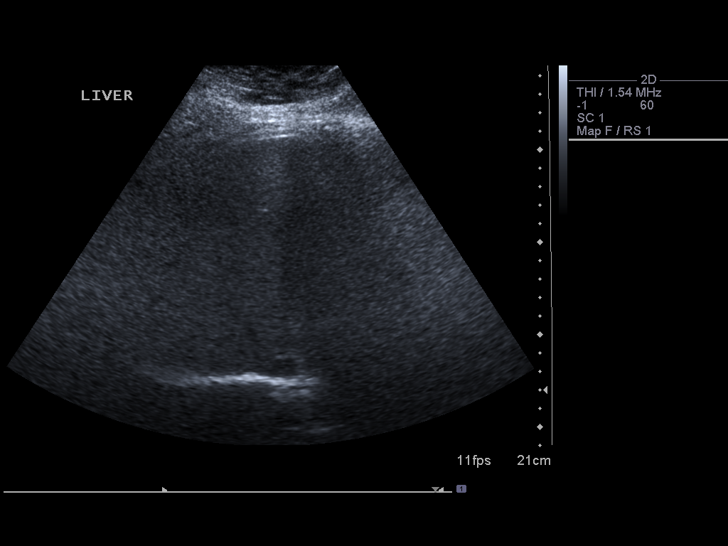
[im 35/60]
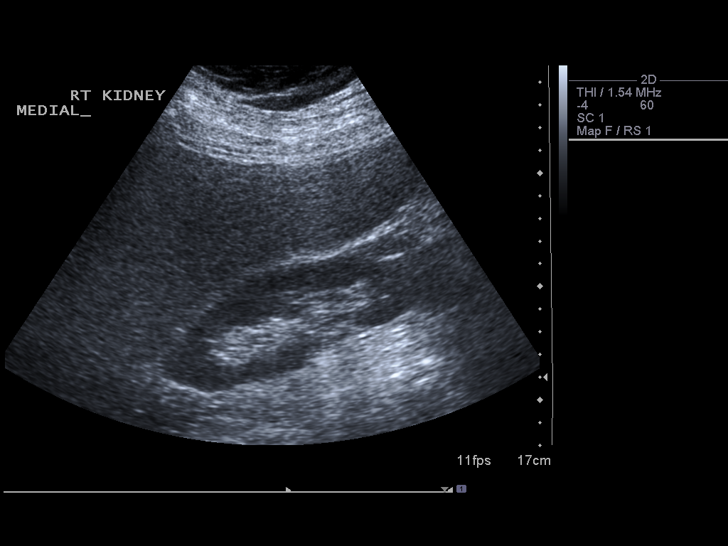
[im 40/60]
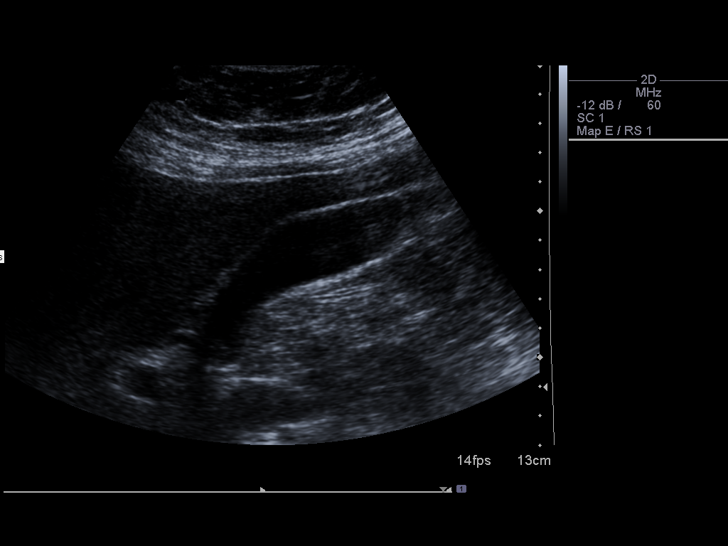
[im 45/60]
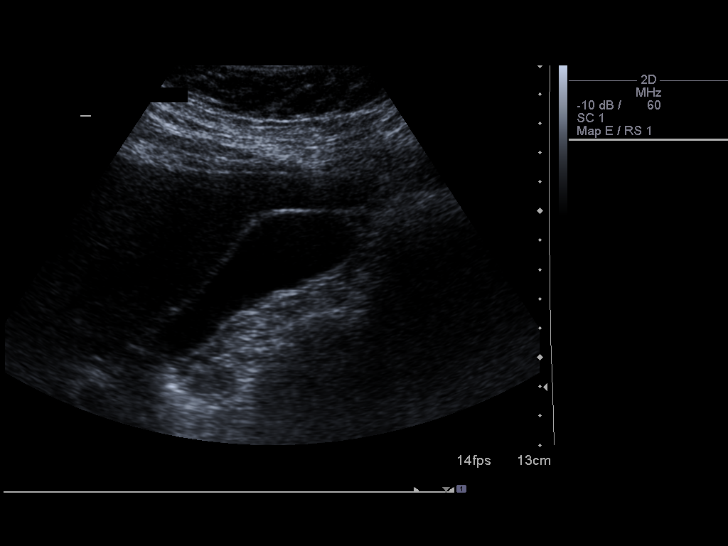
[im 50/60]
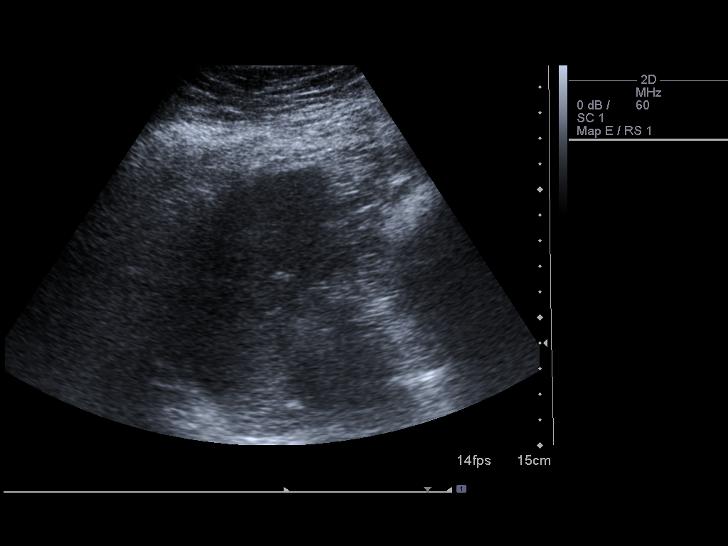
[im 55/60]
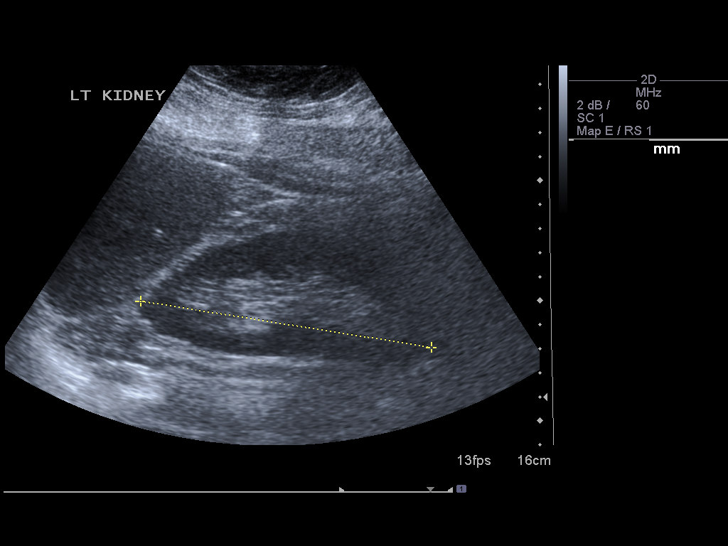
[im 60/60]
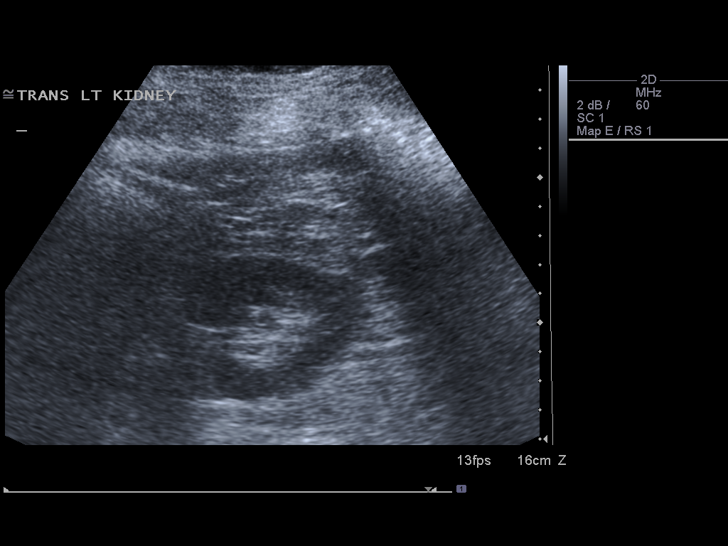

[13 of 25 positions shown; findings below may reference images not displayed]

FINDINGS: Gallbladder:  Sonographically normal without sludge or gallstones
with normal 2 mm wall thickness and no sonographic Murphy's sign
evoked.

Common bile duct:  No dilated intrahepatic or extrahepatic bile
ducts with common bile duct measuring normally at 3 mm.

Liver:  Normal in size with diffuse increase echogenicity and no
focal lesion.

IVC:  Intrahepatic segment appears normal with infrahepatic segment
obscured due to intestinal gas.

Pancreas:  Limited visualization due to overlying gastrointestinal
gas.

Spleen:  Sonographically normal measuring 5.6 cm long.

Right Kidney:  Sonographically normal measuring 12.4 cm long.

Left Kidney:  Sonographically normal measuring

Abdominal aorta:  No aneurysm identified with maximum proximal
diameter 2.5 cm, although bifurcation obscured due to intestinal
gas.

No free fluid.
IMPRESSION: 1.  Obscuration infrahepatic inferior vena cava, abdominal aortic
bifurcation, and pancreas.
2.  Slight diffuse fatty infiltration liver.
3.  Otherwise, negative.

## 2012-01-05 IMAGING — CT CT ABD-PELV W/ CM
1 of 3 series · 14 of 32 positions shown, 19 images · IV contrast (agent unspecified)
Comparison: None.

CLINICAL DATA: Right lower quadrant pain with nausea vomiting.  No
bowel movement for 1 week.

CT ABDOMEN AND PELVIS WITH CONTRAST
TECHNIQUE: Multidetector CT imaging of the abdomen and pelvis was
performed following the standard protocol during bolus
administration of intravenous contrast.
Contrast: 100 ml of Fmnipaque-ERR intravenous contrast

[Series 2: rtn ap with st · axial · 0.79mm/px · z∈[+706,+1111]mm · 14 of 91 slices shown, 19 images]
[im 5/91  soft-tissue]
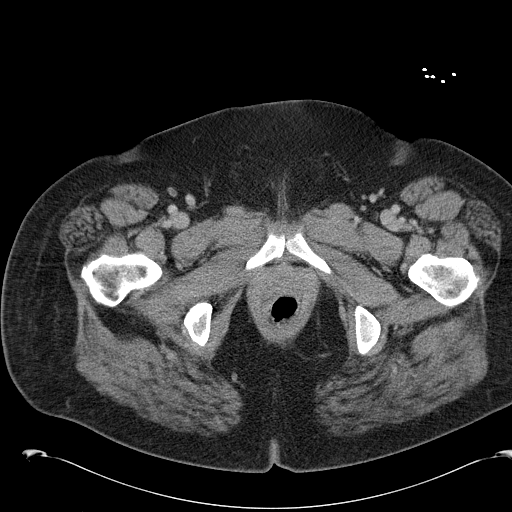
[im 5/91  bone]
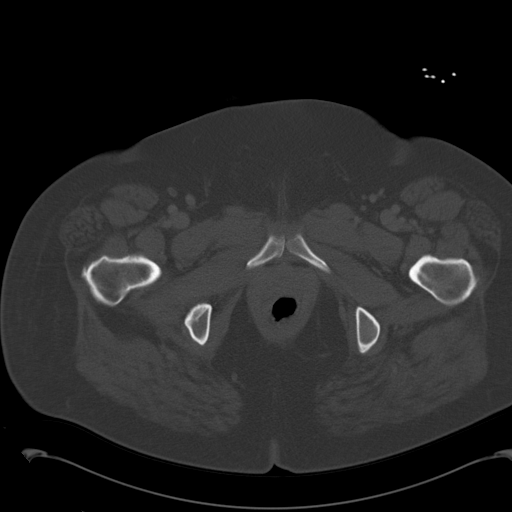
[im 15/91  soft-tissue]
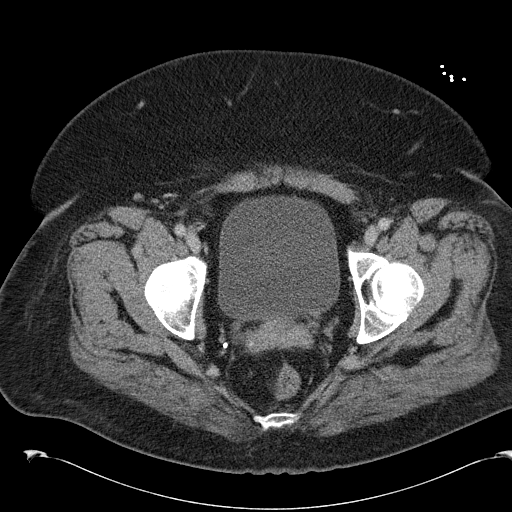
[im 19/91  soft-tissue]
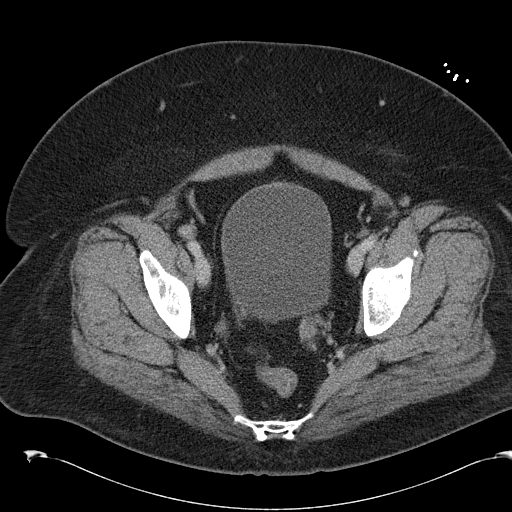
[im 24/91  soft-tissue]
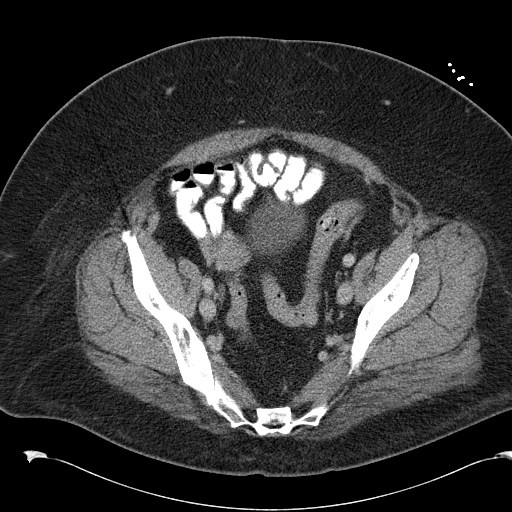
[im 34/91  soft-tissue]
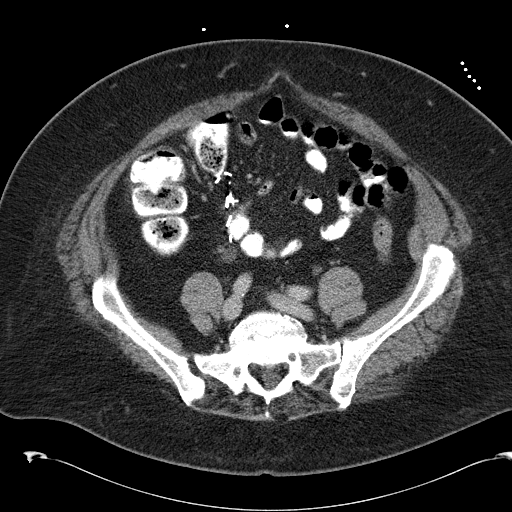
[im 38/91  soft-tissue]
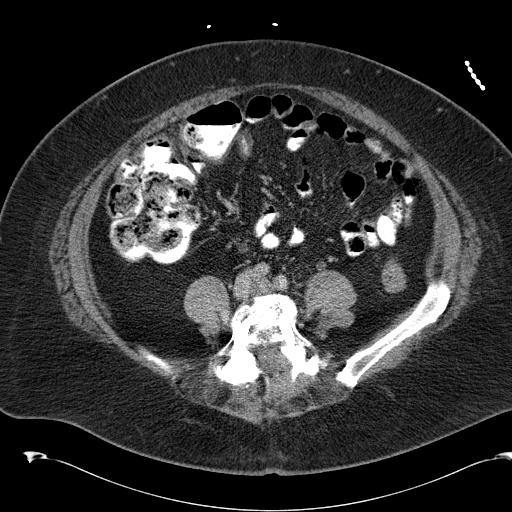
[im 48/91  soft-tissue]
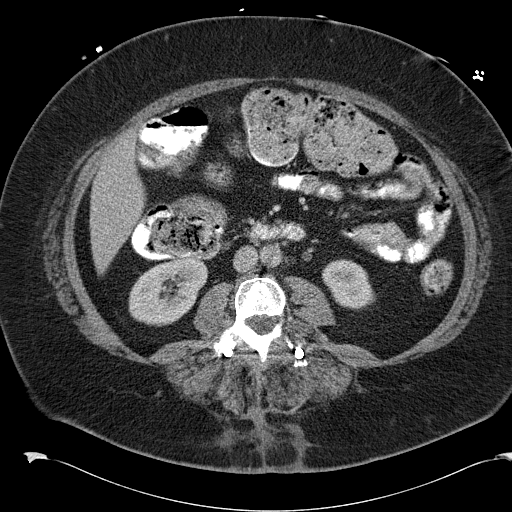
[im 53/91  soft-tissue]
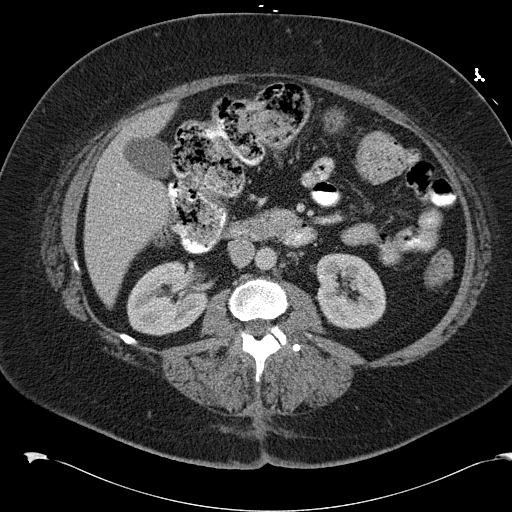
[im 57/91  soft-tissue]
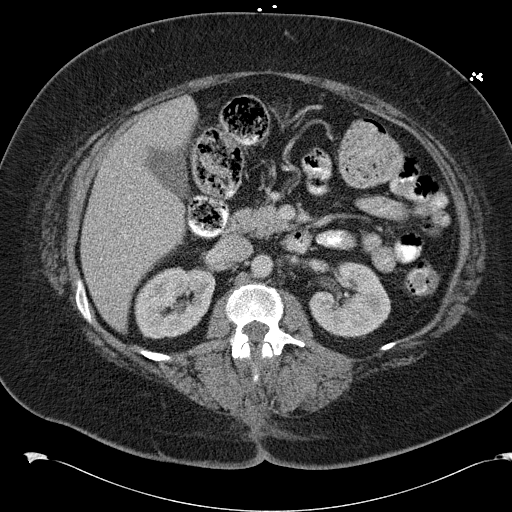
[im 57/91  bone]
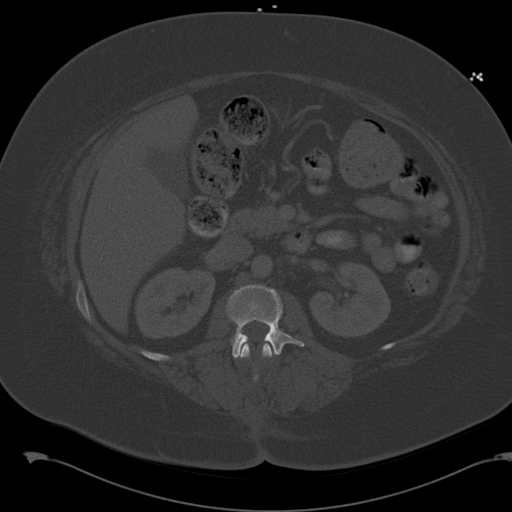
[im 67/91  soft-tissue]
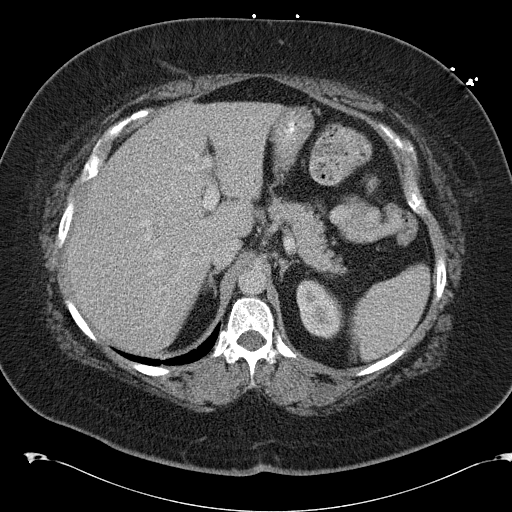
[im 72/91  soft-tissue]
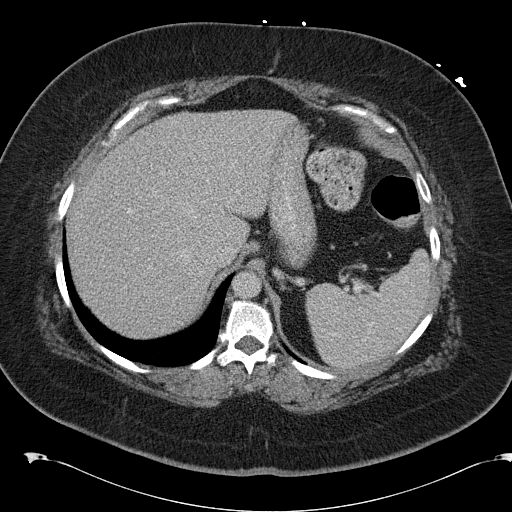
[im 72/91  lung]
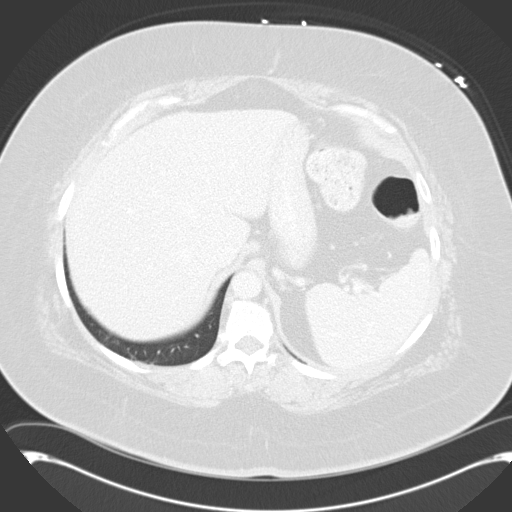
[im 76/91  soft-tissue]
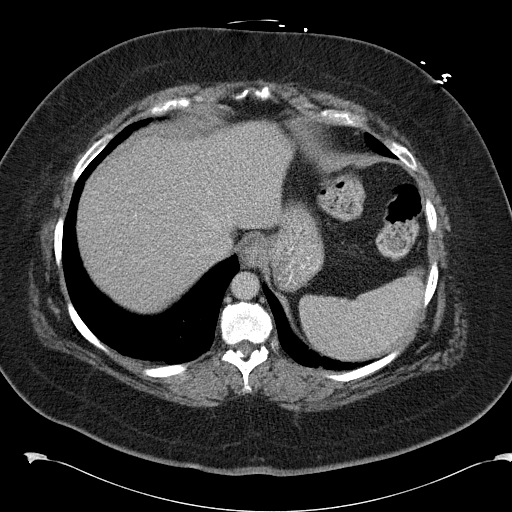
[im 76/91  lung]
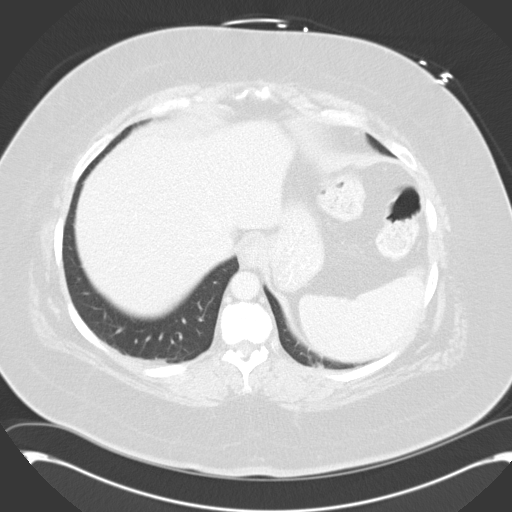
[im 81/91  lung]
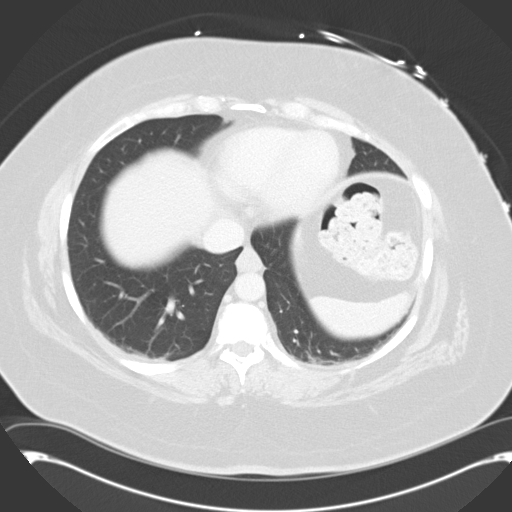
[im 86/91  soft-tissue]
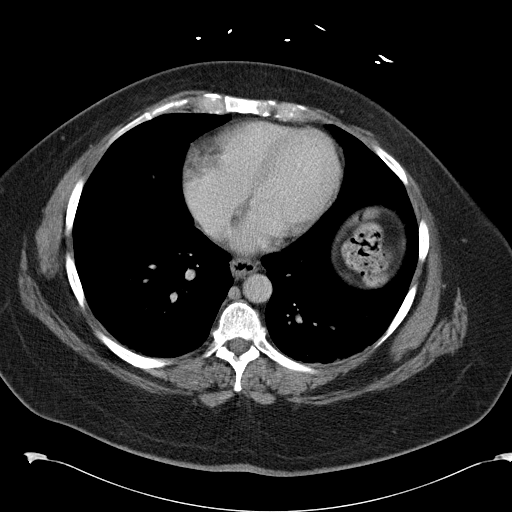
[im 86/91  lung]
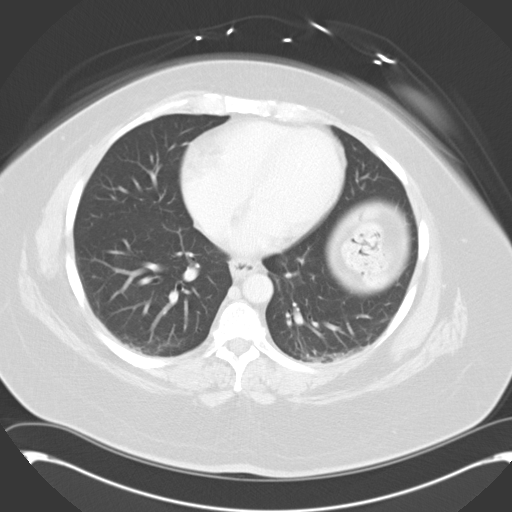

[14 of 32 positions shown; findings below may reference images not displayed]

FINDINGS: Minor subsegmental atelectasis at the lung bases.  The
heart is normal in size.

The liver, spleen, gallbladder and pancreas are unremarkable.  No
bile duct dilation.  No adrenal masses.  Both kidneys are normal in
size and attenuation, with no masses or stones or hydronephrosis.
The bladder is unremarkable.  The uterus is surgically absent.  No
pelvic masses.  No adenopathy.  No abnormal fluid collections.
Increase stool is noted throughout the right colon and transverse
colon.  The bowel is otherwise unremarkable with no inflammatory
changes.  The patient has had an appendectomy.  There are changes
from lumbar spine fusion surgery.  No osteoblastic or osteolytic
lesions.
IMPRESSION: No acute findings.  Increase stool along the right colon and
transverse colon.

## 2012-03-20 ENCOUNTER — Encounter (HOSPITAL_COMMUNITY): Payer: Self-pay | Admitting: Emergency Medicine

## 2012-03-20 ENCOUNTER — Emergency Department (INDEPENDENT_AMBULATORY_CARE_PROVIDER_SITE_OTHER)
Admission: EM | Admit: 2012-03-20 | Discharge: 2012-03-20 | Disposition: A | Discharge status: Home / Self Care | Payer: Medicare Other | Source: Home / Self Care | Attending: Family Medicine | Admitting: Family Medicine

## 2012-03-20 DIAGNOSIS — M65839 Other synovitis and tenosynovitis, unspecified forearm: Secondary | ICD-10-CM

## 2012-03-20 DIAGNOSIS — M778 Other enthesopathies, not elsewhere classified: Secondary | ICD-10-CM

## 2012-03-20 MED ORDER — DICLOFENAC SODIUM 1 % TD GEL: 4.0000 g | Freq: Four times a day (QID) | TRANSDERMAL | Status: DC

## 2012-03-20 NOTE — ED Notes (Signed)
Reports right wrist pain.  Patient states this morning when she pushed herself up she felt pain and then noticed a knot.

## 2012-03-20 NOTE — ED Provider Notes (Signed)
History     CSN: [id number]  Arrival date & time 03/20/12  1635   First MD Initiated Contact with Patient 03/20/12 1727      Chief Complaint  Patient presents with  . Wrist Pain    (Consider location/radiation/quality/duration/timing/severity/associated sxs/prior treatment) Patient is a 46 y.o. female presenting with wrist pain. The history is provided by the patient.  Wrist Pain This is a new problem. The current episode started 6 to 12 hours ago (felt sl pain pushing herself out of bed this am, continues to hurt since.). The problem has not changed since onset.Associated symptoms comments: Pt is disabled and no repetitive use activity.. The symptoms are aggravated by bending and twisting.    Past Medical History  Diagnosis Date  . Hypertension   . Asthma   . Sleep apnea   . Anxiety   . Depression   . Bipolar 1 disorder     ect treatments last treatment Sep 02 1011  . GERD (gastroesophageal reflux disease)     Past Surgical History  Procedure Date  . Abdominal hysterectomy   . Back surgery   . Laproscopic knee surgery     History reviewed. No pertinent family history.  History  Substance Use Topics  . Smoking status: Never Smoker   . Smokeless tobacco: Not on file  . Alcohol Use: No    OB History    Grav Para Term Preterm Abortions TAB SAB Ect Mult Living                  Review of Systems  Constitutional: Negative.   Musculoskeletal: Positive for joint swelling.  Skin: Negative.     Allergies  Codeine; Morphine and related; Tetracyclines & related; Tramadol; Ciprofloxacin; and Penicillins  Home Medications   Current Outpatient Rx  Name  Route  Sig  Dispense  Refill  . ALBUTEROL SULFATE HFA 108 (90 BASE) MCG/ACT IN AERS   Inhalation   Inhale 2 puffs into the lungs every 4 (four) hours as needed for wheezing or shortness of breath.   1 Inhaler   0   . CITALOPRAM HYDROBROMIDE 40 MG PO TABS   Oral   Take 1 tablet (40 mg total) by mouth daily.  For depresion.   30 tablet   0   . DICLOFENAC SODIUM 1 % TD GEL   Topical   Apply 4 g topically 4 (four) times daily. Please instruct in dosing   100 g   1   . GABAPENTIN 600 MG PO TABS   Oral   Take 1 tablet (600 mg total) by mouth 3 (three) times daily. For anxiety and pain management. May have to stop to take ECT.   90 tablet   0   . HALOPERIDOL 5 MG PO TABS   Oral   Take 1 tablet (5 mg total) by mouth 2 (two) times daily. For psychosis   60 tablet   0   . LUBIPROSTONE 24 MCG PO CAPS   Oral   Take 1 capsule (24 mcg total) by mouth 2 (two) times daily with a meal. For constipation   60 capsule   0   . PANTOPRAZOLE SODIUM 40 MG PO TBEC   Oral   Take 1 tablet (40 mg total) by mouth daily. For control of stomach acid secretion and helps GERD.   30 tablet   0   . SUMATRIPTAN SUCCINATE 100 MG PO TABS   Oral   Take 1 tablet (100 mg total) by  mouth every 2 (two) hours as needed. migranes   10 tablet   0     BP 121/82  Pulse 85  Temp 98.2 F (36.8 C) (Oral)  Resp 18  SpO2 96%  Physical Exam  Nursing note and vitals reviewed. Constitutional: She is oriented to person, place, and time. She appears well-developed and well-nourished.  HENT:  Head: Normocephalic.  Musculoskeletal: She exhibits tenderness. She exhibits no edema.       Right wrist: She exhibits decreased range of motion, tenderness and swelling. She exhibits no bony tenderness, no effusion, no crepitus and no deformity.       Arms: Neurological: She is alert and oriented to person, place, and time.  Skin: Skin is warm and dry.    ED Course  Procedures (including critical care time)  Labs Reviewed - No data to display No results found.   1. Tendonitis of wrist, right       MDM          Linna Hoff, MD 03/20/12 1745

## 2012-03-20 NOTE — ED Notes (Signed)
Patient triaged by [place], cma

## 2012-03-23 ENCOUNTER — Encounter (HOSPITAL_COMMUNITY): Payer: Self-pay

## 2012-03-23 ENCOUNTER — Emergency Department (HOSPITAL_COMMUNITY): Payer: Medicare Other

## 2012-03-23 ENCOUNTER — Emergency Department (HOSPITAL_COMMUNITY)
Admission: EM | Admit: 2012-03-23 | Discharge: 2012-03-23 | Disposition: A | Discharge status: Home / Self Care | Payer: Medicare Other | Attending: Emergency Medicine | Admitting: Emergency Medicine

## 2012-03-23 DIAGNOSIS — F411 Generalized anxiety disorder: Secondary | ICD-10-CM | POA: Insufficient documentation

## 2012-03-23 DIAGNOSIS — F3289 Other specified depressive episodes: Secondary | ICD-10-CM | POA: Insufficient documentation

## 2012-03-23 DIAGNOSIS — F319 Bipolar disorder, unspecified: Secondary | ICD-10-CM | POA: Insufficient documentation

## 2012-03-23 DIAGNOSIS — Z79899 Other long term (current) drug therapy: Secondary | ICD-10-CM | POA: Insufficient documentation

## 2012-03-23 DIAGNOSIS — R569 Unspecified convulsions: Secondary | ICD-10-CM

## 2012-03-23 DIAGNOSIS — J45909 Unspecified asthma, uncomplicated: Secondary | ICD-10-CM | POA: Insufficient documentation

## 2012-03-23 DIAGNOSIS — K219 Gastro-esophageal reflux disease without esophagitis: Secondary | ICD-10-CM | POA: Insufficient documentation

## 2012-03-23 DIAGNOSIS — F329 Major depressive disorder, single episode, unspecified: Secondary | ICD-10-CM | POA: Insufficient documentation

## 2012-03-23 DIAGNOSIS — G4730 Sleep apnea, unspecified: Secondary | ICD-10-CM | POA: Insufficient documentation

## 2012-03-23 DIAGNOSIS — I10 Essential (primary) hypertension: Secondary | ICD-10-CM | POA: Insufficient documentation

## 2012-03-23 LAB — CBC WITH DIFFERENTIAL/PLATELET
Basophils Absolute: 0 10*3/uL (ref 0.0–0.1)
Basophils Relative: 0 % (ref 0–1)
Eosinophils Absolute: 0.1 10*3/uL (ref 0.0–0.7)
Eosinophils Relative: 2 % (ref 0–5)
HCT: 36.4 % (ref 36.0–46.0)
Hemoglobin: 12.7 g/dL (ref 12.0–15.0)
Lymphocytes Relative: 37 % (ref 12–46)
Lymphs Abs: 2.7 10*3/uL (ref 0.7–4.0)
MCH: 31.8 pg (ref 26.0–34.0)
MCHC: 34.9 g/dL (ref 30.0–36.0)
MCV: 91.2 fL (ref 78.0–100.0)
Monocytes Absolute: 0.4 10*3/uL (ref 0.1–1.0)
Monocytes Relative: 6 % (ref 3–12)
Neutro Abs: 3.9 10*3/uL (ref 1.7–7.7)
Neutrophils Relative %: 55 % (ref 43–77)
Platelets: 259 10*3/uL (ref 150–400)
RBC: 3.99 MIL/uL (ref 3.87–5.11)
RDW: 12.6 % (ref 11.5–15.5)
WBC: 7.2 10*3/uL (ref 4.0–10.5)

## 2012-03-23 LAB — TROPONIN I: Troponin I: <0.3 ng/mL (ref <0.30)

## 2012-03-23 LAB — COMPREHENSIVE METABOLIC PANEL
ALT: 16 U/L (ref 0–35)
AST: 16 U/L (ref 0–37)
Albumin: 3.4 g/dL — ABNORMAL LOW (ref 3.5–5.2)
Alkaline Phosphatase: 66 U/L (ref 39–117)
BUN: 6 mg/dL (ref 6–23)
CO2: 23 mEq/L (ref 19–32)
Calcium: 9.5 mg/dL (ref 8.4–10.5)
Chloride: 106 mEq/L (ref 96–112)
Creatinine, Ser: 0.55 mg/dL (ref 0.50–1.10)
GFR calc Af Amer: >90 mL/min (ref >90)
GFR calc non Af Amer: >90 mL/min (ref >90)
Glucose, Bld: 93 mg/dL (ref 70–99)
Potassium: 3.6 mEq/L (ref 3.5–5.1)
Sodium: 141 mEq/L (ref 135–145)
Total Bilirubin: 0.3 mg/dL (ref 0.3–1.2)
Total Protein: 6.7 g/dL (ref 6.0–8.3)

## 2012-03-23 NOTE — ED Provider Notes (Signed)
History     CSN: [id number]  Arrival date & time 03/23/12  1721   First MD Initiated Contact with Patient 03/23/12 1722      Chief Complaint  Patient presents with  . Seizures    (Consider location/radiation/quality/duration/timing/severity/associated sxs/prior treatment) Patient is a 46 y.o. female presenting with seizures. The history is provided by the patient (pt states she went out and by standers stated she had a sz). No language interpreter was used.  Seizures  This is a new problem. The current episode started less than 1 hour ago. The problem has been resolved. There was 1 seizure. Pertinent negatives include no sleepiness, no headaches, no chest pain, no cough and no diarrhea. Characteristics include rhythmic jerking. The episode was witnessed. There was no sensation of an aura present. The seizures did not continue in the ED. The seizure(s) had no focality. Possible causes do not include med or dosage change. There has been no fever.    Past Medical History  Diagnosis Date  . Hypertension   . Asthma   . Sleep apnea   . Anxiety   . Depression   . Bipolar 1 disorder     ect treatments last treatment Sep 02 1011  . GERD (gastroesophageal reflux disease)     Past Surgical History  Procedure Date  . Abdominal hysterectomy   . Back surgery   . Laproscopic knee surgery     No family history on file.  History  Substance Use Topics  . Smoking status: Never Smoker   . Smokeless tobacco: Not on file  . Alcohol Use: No    OB History    Grav Para Term Preterm Abortions TAB SAB Ect Mult Living                  Review of Systems  Constitutional: Negative for fatigue.  HENT: Negative for congestion, sinus pressure and ear discharge.   Eyes: Negative for discharge.  Respiratory: Negative for cough.   Cardiovascular: Negative for chest pain.  Gastrointestinal: Negative for abdominal pain and diarrhea.  Genitourinary: Negative for frequency and hematuria.   Musculoskeletal: Negative for back pain.  Skin: Negative for rash.  Neurological: Positive for seizures. Negative for headaches.  Hematological: Negative.   Psychiatric/Behavioral: Negative for hallucinations.    Allergies  Codeine; Morphine and related; Tetracyclines & related; Tramadol; Ciprofloxacin; and Penicillins  Home Medications   Current Outpatient Rx  Name  Route  Sig  Dispense  Refill  . ALBUTEROL SULFATE HFA 108 (90 BASE) MCG/ACT IN AERS   Inhalation   Inhale 2 puffs into the lungs every 4 (four) hours as needed. For wheezing         . CITALOPRAM HYDROBROMIDE 40 MG PO TABS   Oral   Take 40 mg by mouth at bedtime. For depresion.         Marland Kitchen DICLOFENAC SODIUM 1 % TD GEL   Topical   Apply 4 g topically 4 (four) times daily as needed. For pain         . GABAPENTIN 600 MG PO TABS   Oral   Take 1 tablet (600 mg total) by mouth 3 (three) times daily. For anxiety and pain management. May have to stop to take ECT.   90 tablet   0   . HALOPERIDOL 5 MG PO TABS   Oral   Take 1 tablet (5 mg total) by mouth 2 (two) times daily. For psychosis   60 tablet   0   .  LUBIPROSTONE 24 MCG PO CAPS   Oral   Take 1 capsule (24 mcg total) by mouth 2 (two) times daily with a meal. For constipation   60 capsule   0   . PANTOPRAZOLE SODIUM 40 MG PO TBEC   Oral   Take 1 tablet (40 mg total) by mouth daily. For control of stomach acid secretion and helps GERD.   30 tablet   0   . SUMATRIPTAN SUCCINATE 100 MG PO TABS   Oral   Take 100 mg by mouth every 2 (two) hours as needed. migranes           BP 143/81  Temp 98.8 F (37.1 C) (Oral)  Resp 21  SpO2 99%  Physical Exam  Constitutional: She is oriented to person, place, and time. She appears well-developed.  HENT:  Head: Normocephalic and atraumatic.  Eyes: Conjunctivae normal and EOM are normal. No scleral icterus.  Neck: Neck supple. No thyromegaly present.  Cardiovascular: Normal rate and regular rhythm.  Exam  reveals no gallop and no friction rub.   No murmur heard. Pulmonary/Chest: No stridor. She has no wheezes. She has no rales. She exhibits no tenderness.  Abdominal: She exhibits no distension. There is no tenderness. There is no rebound.  Musculoskeletal: Normal range of motion. She exhibits no edema.  Lymphadenopathy:    She has no cervical adenopathy.  Neurological: She is oriented to person, place, and time. Coordination normal.  Skin: No rash noted. No erythema.  Psychiatric: She has a normal mood and affect. Her behavior is normal.    ED Course  Procedures (including critical care time)  Labs Reviewed  COMPREHENSIVE METABOLIC PANEL - Abnormal; Notable for the following:    Albumin 3.4 (*)     All other components within normal limits  CBC WITH DIFFERENTIAL  TROPONIN I   Dg Chest 2 View  03/23/2012  *RADIOLOGY REPORT*  Clinical Data: Syncopal episode  CHEST - 2 VIEW  Comparison: Chest x-ray of 07/30/2010  Findings: No active infiltrate or effusion is seen.  Mediastinal contours appear stable.  The heart is within upper limits of normal.  No bony abnormality is seen.  IMPRESSION: Stable chest x-ray.  No active lung disease.   Original Report Authenticated By: Dwyane Dee, M.D.    Ct Head Wo Contrast  03/23/2012  *RADIOLOGY REPORT*  Clinical Data: Possible seizure  CT HEAD WITHOUT CONTRAST  Technique:  Contiguous axial images were obtained from the base of the skull through the vertex without contrast.  Comparison: CT brain scan of 07/30/2010  Findings: The ventricular system is normal in size and configuration, and the septum is in a normal midline position.  The fourth ventricle basilar cisterns appear normal.  No hemorrhage, mass lesion, or acute infarction is seen.  On bone window images, there may be a retention cyst in the floor of the right maxillary sinus which is only partially visualized.  The remainder of the paranasal sinuses are clear.  No acute calvarial abnormality is seen,  with a somewhat thickened calvarium present.  IMPRESSION: No acute intracranial abnormality.  Probable small retention cyst in the right maxillary sinus.   Original Report Authenticated By: Dwyane Dee, M.D.      1. Seizure      Date: 03/23/2012  Rate:84  Rhythm: normal sinus rhythm  QRS Axis: normal  Intervals: normal  ST/T Wave abnormalities: normal  Conduction Disutrbances:none  Narrative Interpretation:   Old EKG Reviewed: none available    MDM  Benny Lennert, MD 03/23/12 [phone]

## 2012-03-23 NOTE — ED Notes (Signed)
Patient presents with possible seizure while in [employer] today.  EMS reporting witnesses, patient alert and oriented x 3 reports she's only had seizures when she was taking Tramadol.  Patient was lowered to floor from standing position, patient reporting headache and hip pain from laying on the floor.

## 2012-08-01 ENCOUNTER — Telehealth: Payer: Self-pay | Admitting: Family Medicine

## 2012-08-01 DIAGNOSIS — R300 Dysuria: Secondary | ICD-10-CM

## 2012-08-01 NOTE — Telephone Encounter (Signed)
Has CPE appt in two weeks.  Now c/o a lot of dysuria.  Wanted to know what she could buy OTC to treat until her appt.  Told if has UTI do not want to wait two weeks for treatments and OTC only mask symptoms.  Ok pt to come to office and leave urine specimen.  We can call her with results and then still see for CPE in two weeks.

## 2012-08-18 ENCOUNTER — Encounter: Payer: Self-pay | Admitting: Family Medicine

## 2012-08-18 ENCOUNTER — Ambulatory Visit (INDEPENDENT_AMBULATORY_CARE_PROVIDER_SITE_OTHER): Payer: Medicare Other | Admitting: Family Medicine

## 2012-08-18 VITALS — BP 126/82 | HR 63 | Temp 98.7°F | Resp 18 | Ht 64.0 in | Wt 228.0 lb

## 2012-08-18 DIAGNOSIS — E785 Hyperlipidemia, unspecified: Secondary | ICD-10-CM

## 2012-08-18 DIAGNOSIS — R5383 Other fatigue: Secondary | ICD-10-CM

## 2012-08-18 DIAGNOSIS — Z0000 Encounter for general adult medical examination without abnormal findings: Secondary | ICD-10-CM

## 2012-08-18 DIAGNOSIS — R5381 Other malaise: Secondary | ICD-10-CM

## 2012-08-18 LAB — TSH: TSH: 2.567 u[IU]/mL (ref 0.350–4.500)

## 2012-08-18 LAB — CBC WITH DIFFERENTIAL/PLATELET
Basophils Absolute: 0 10*3/uL (ref 0.0–0.1)
Basophils Relative: 1 % (ref 0–1)
Eosinophils Absolute: 0.1 10*3/uL (ref 0.0–0.7)
Eosinophils Relative: 2 % (ref 0–5)
HCT: 36.9 % (ref 36.0–46.0)
Hemoglobin: 12.7 g/dL (ref 12.0–15.0)
Lymphocytes Relative: 33 % (ref 12–46)
Lymphs Abs: 1.8 10*3/uL (ref 0.7–4.0)
MCH: 31 pg (ref 26.0–34.0)
MCHC: 34.4 g/dL (ref 30.0–36.0)
MCV: 90 fL (ref 78.0–100.0)
Monocytes Absolute: 0.3 10*3/uL (ref 0.1–1.0)
Monocytes Relative: 6 % (ref 3–12)
Neutro Abs: 3.3 10*3/uL (ref 1.7–7.7)
Neutrophils Relative %: 58 % (ref 43–77)
Platelets: 309 10*3/uL (ref 150–400)
RBC: 4.1 MIL/uL (ref 3.87–5.11)
RDW: 15.2 % (ref 11.5–15.5)
WBC: 5.6 10*3/uL (ref 4.0–10.5)

## 2012-08-18 LAB — BASIC METABOLIC PANEL
BUN: 6 mg/dL (ref 6–23)
CO2: 26 mEq/L (ref 19–32)
Calcium: 9.5 mg/dL (ref 8.4–10.5)
Chloride: 105 mEq/L (ref 96–112)
Creat: 0.61 mg/dL (ref 0.50–1.10)
Glucose, Bld: 91 mg/dL (ref 70–99)
Potassium: 4.5 mEq/L (ref 3.5–5.3)
Sodium: 140 mEq/L (ref 135–145)

## 2012-08-18 LAB — LIPID PANEL
Cholesterol: 205 mg/dL — ABNORMAL HIGH (ref 0–200)
HDL: 61 mg/dL (ref >39)
LDL Cholesterol: 124 mg/dL — ABNORMAL HIGH (ref 0–99)
Total CHOL/HDL Ratio: 3.4 Ratio
Triglycerides: 98 mg/dL (ref <150)
VLDL: 20 mg/dL (ref 0–40)

## 2012-08-18 LAB — HEPATIC FUNCTION PANEL
ALT: 18 U/L (ref 0–35)
AST: 18 U/L (ref 0–37)
Albumin: 4.2 g/dL (ref 3.5–5.2)
Alkaline Phosphatase: 78 U/L (ref 39–117)
Bilirubin, Direct: 0.1 mg/dL (ref 0.0–0.3)
Indirect Bilirubin: 0.5 mg/dL (ref 0.0–0.9)
Total Bilirubin: 0.6 mg/dL (ref 0.3–1.2)
Total Protein: 7 g/dL (ref 6.0–8.3)

## 2012-08-18 MED ORDER — SUMATRIPTAN SUCCINATE 100 MG PO TABS: 100.0000 mg | ORAL_TABLET | ORAL | Status: DC | PRN

## 2012-08-18 MED ORDER — FLUTICASONE PROPIONATE 50 MCG/ACT NA SUSP: 2.0000 | Freq: Every day | NASAL | Status: DC

## 2012-08-18 MED ORDER — PANTOPRAZOLE SODIUM 40 MG PO TBEC: 40.0000 mg | DELAYED_RELEASE_TABLET | Freq: Every day | ORAL | Status: DC

## 2012-08-18 NOTE — Progress Notes (Signed)
Subjective:    Patient ID: [name] [name], female    DOB: [birth year], 47 y.o.   MRN: [id number]  HPI Patient is here today for her annual wellness exam. She's had a hysterectomy. She also has a regular gynecologist. Gynecologist performs her pelvic exams, Pap smear and breast exam. She plans to followup with her gynecologist for female care. Therefore that part of the physical will be deferred today. She is also concerned because she has generalized fatigue and malaise.  she has occasional headaches which are migraines. She denies any chest pain, shortness of breath, dyspnea on exertion. She denies any recent weight gain. Her weight is 220 pounds this is been stable the last 4 months. Her goal weight would be 145 pounds.   Past Medical History  Diagnosis Date  . Hypertension   . Asthma   . Sleep apnea   . Anxiety   . Depression   . Bipolar 1 disorder     ect treatments last treatment Sep 02 1011  . GERD (gastroesophageal reflux disease)    Current Outpatient Prescriptions on File Prior to Visit  Medication Sig Dispense Refill  . albuterol (PROVENTIL HFA;VENTOLIN HFA) 108 (90 BASE) MCG/ACT inhaler Inhale 2 puffs into the lungs every 4 (four) hours as needed. For wheezing      . citalopram (CELEXA) 40 MG tablet Take 40 mg by mouth at bedtime. For depresion.      . gabapentin (NEURONTIN) 600 MG tablet Take 1 tablet (600 mg total) by mouth 3 (three) times daily. For anxiety and pain management. May have to stop to take ECT.  90 tablet  0  . haloperidol (HALDOL) 5 MG tablet Take 1 tablet (5 mg total) by mouth 2 (two) times daily. For psychosis  60 tablet  0  . [DISCONTINUED] benztropine (COGENTIN) 1 MG tablet Take 1 tablet (1 mg total) by mouth 2 (two) times daily. For stiffness from the Haldol  60 tablet  0  . [DISCONTINUED] lithium carbonate (LITHOBID) 300 MG CR tablet Take 1 tablet (300 mg total) by mouth every 12 (twelve) hours. For mood swings and depression  60 tablet  0   No current  facility-administered medications on file prior to visit.   Allergies  Allergen Reactions  . Codeine Itching  . Morphine And Related Other (See Comments)    Memory loss  . Tetracyclines & Related Other (See Comments)    syncope  . Tramadol Other (See Comments)    seizures  . Ciprofloxacin Rash  . Penicillins Hives and Rash   History   Social History  . Marital Status: Legally Separated    Spouse Name: N/A    Number of Children: N/A  . Years of Education: N/A   Occupational History  . Not on file.   Social History Main Topics  . Smoking status: Never Smoker   . Smokeless tobacco: Not on file  . Alcohol Use: No  . Drug Use: No  . Sexually Active: No   Other Topics Concern  . Not on file   Social History Narrative  . No narrative on file   Family History  Problem Relation Age of Onset  . Diabetes Mother   . COPD Mother   . Hypertension Mother   . Hyperlipidemia Mother   . Heart disease Father   . Hyperlipidemia Father   . Hypertension Father   . Heart disease Brother   . Heart disease Daughter       Review of Systems  All  other systems reviewed and are negative.       Objective:   Physical Exam  Constitutional: She is oriented to person, place, and time. She appears well-developed and well-nourished. No distress.  HENT:  Head: Normocephalic and atraumatic.  Right Ear: External ear normal.  Left Ear: External ear normal.  Nose: Nose normal.  Mouth/Throat: Oropharynx is clear and moist. No oropharyngeal exudate.  Eyes: Conjunctivae and EOM are normal. Pupils are equal, round, and reactive to light. Right eye exhibits no discharge. Left eye exhibits no discharge.  Neck: Normal range of motion. Neck supple. No JVD present. No tracheal deviation present. No thyromegaly present.  Cardiovascular: Normal rate, regular rhythm and normal heart sounds.  Exam reveals no gallop and no friction rub.   No murmur heard. Pulmonary/Chest: Effort normal and breath  sounds normal. No respiratory distress. She has no wheezes. She has no rales. She exhibits no tenderness.  Abdominal: Soft. Bowel sounds are normal. She exhibits no distension and no mass. There is no tenderness. There is no rebound and no guarding.  Musculoskeletal: Normal range of motion. She exhibits no edema and no tenderness.  Lymphadenopathy:    She has no cervical adenopathy.  Neurological: She is alert and oriented to person, place, and time. She has normal reflexes. She displays normal reflexes. No cranial nerve deficit. She exhibits normal muscle tone. Coordination normal.  Skin: Skin is warm and dry. No rash noted. She is not diaphoretic. No erythema. No pallor.  Psychiatric: She has a normal mood and affect. Her behavior is normal. Judgment and thought content normal.          Assessment & Plan:  1. Routine general medical examination at a health care facility I recommended the patient contact her gynecologist and schedule her annual exam for Pap/pelvic/breast exam. I will draw routine labs which are pertinent for her physical.  I recommended increasing exercise to 5 days a week 15-30 minutes a day. I recommended dieting. I like to the patient tried to lose 15-20 pounds over the next 6 months. This is particularly important given her family history of diabetes and early heart disease - Basic Metabolic Panel - CBC with Differential - Hepatic Function Panel - TSH  2. Other malaise and fatigue I believe this is likely due to her psychologic comorbidities, are elevated BMI, and lack of exercise. Therefore I recommended therapeutic lifestyle changes to try to address her fatigue. Also check for anemia and hypothyroidism the - CBC with Differential - TSH  3. Other and unspecified hyperlipidemia Obtain a fasting lipid panel. Given her family history of early heart disease, I believe her goal LDL should be less than 100. - Lipid Panel

## 2012-09-07 ENCOUNTER — Encounter: Payer: Self-pay | Admitting: Family Medicine

## 2012-09-07 ENCOUNTER — Ambulatory Visit (INDEPENDENT_AMBULATORY_CARE_PROVIDER_SITE_OTHER): Payer: Medicare Other | Admitting: Family Medicine

## 2012-09-07 ENCOUNTER — Ambulatory Visit
Admission: RE | Admit: 2012-09-07 | Discharge: 2012-09-07 | Disposition: A | Discharge status: Home / Self Care | Payer: Medicare Other | Source: Ambulatory Visit | Attending: Family Medicine | Admitting: Family Medicine

## 2012-09-07 VITALS — BP 140/88 | HR 86 | Temp 98.0°F | Resp 20 | Wt 218.0 lb

## 2012-09-07 DIAGNOSIS — R109 Unspecified abdominal pain: Secondary | ICD-10-CM

## 2012-09-07 DIAGNOSIS — A088 Other specified intestinal infections: Secondary | ICD-10-CM

## 2012-09-07 DIAGNOSIS — A084 Viral intestinal infection, unspecified: Secondary | ICD-10-CM

## 2012-09-07 MED ORDER — ONDANSETRON HCL 4 MG PO TABS: 4.0000 mg | ORAL_TABLET | Freq: Three times a day (TID) | ORAL | Status: DC | PRN

## 2012-09-07 MED ORDER — PROMETHAZINE HCL 25 MG/ML IJ SOLN
25.0000 mg | Freq: Once | INTRAMUSCULAR | Status: AC
  Administered 2012-09-07: 25 mg via INTRAMUSCULAR

## 2012-09-07 NOTE — Progress Notes (Signed)
Subjective:    Patient ID: [name] [name], female    DOB: 12-19-1965, 47 y.o.   MRN: [id number]  HPI  Patient is here with days of nausea, crampy left upper quadrant abdominal pain, vomiting, and diarrhea.  Denies any fevers. She denies any melena. She denies any hematochezia. She denies any sick contacts. She is having 5-6 episodes of emesis per day. She also reports 4 or 5 watery stools per day. She's having a hard time keeping down anything by mouth. There no exacerbating or alleviating factors. Past Medical History  Diagnosis Date  . Hypertension   . Asthma   . Sleep apnea   . Anxiety   . Depression   . Bipolar 1 disorder     ect treatments last treatment Sep 02 1011  . GERD (gastroesophageal reflux disease)    Current Outpatient Prescriptions on File Prior to Visit  Medication Sig Dispense Refill  . albuterol (PROVENTIL HFA;VENTOLIN HFA) 108 (90 BASE) MCG/ACT inhaler Inhale 2 puffs into the lungs every 4 (four) hours as needed. For wheezing      . Calcium Carbonate-Vit D-Min (CALCIUM 1200 PO) Take 1 tablet by mouth daily.      . citalopram (CELEXA) 40 MG tablet Take 40 mg by mouth at bedtime. For depresion.      . fluticasone (FLONASE) 50 MCG/ACT nasal spray Place 2 sprays into the nose daily.  16 g  6  . folic acid (FOLVITE) 800 MCG tablet Take 400 mcg by mouth daily.      Marland Kitchen gabapentin (NEURONTIN) 600 MG tablet Take 1 tablet (600 mg total) by mouth 3 (three) times daily. For anxiety and pain management. May have to stop to take ECT.  90 tablet  0  . haloperidol (HALDOL) 5 MG tablet Take 1 tablet (5 mg total) by mouth 2 (two) times daily. For psychosis  60 tablet  0  . HYDROcodone-acetaminophen (NORCO/VICODIN) 5-325 MG per tablet Take 1 tablet by mouth every 6 (six) hours as needed for pain.      . meloxicam (MOBIC) 15 MG tablet Take 15 mg by mouth daily.      . methocarbamol (ROBAXIN) 750 MG tablet Take 750 mg by mouth 4 (four) times daily.      . pantoprazole (PROTONIX) 40 MG  tablet Take 1 tablet (40 mg total) by mouth daily. For control of stomach acid secretion and helps GERD.  30 tablet  11  . prazosin (MINIPRESS) 5 MG capsule Take 5 mg by mouth at bedtime.      . SUMAtriptan (IMITREX) 100 MG tablet Take 1 tablet (100 mg total) by mouth every 2 (two) hours as needed. migranes  10 tablet  2  . [DISCONTINUED] benztropine (COGENTIN) 1 MG tablet Take 1 tablet (1 mg total) by mouth 2 (two) times daily. For stiffness from the Haldol  60 tablet  0  . [DISCONTINUED] lithium carbonate (LITHOBID) 300 MG CR tablet Take 1 tablet (300 mg total) by mouth every 12 (twelve) hours. For mood swings and depression  60 tablet  0   No current facility-administered medications on file prior to visit.   Allergies  Allergen Reactions  . Codeine Itching  . Morphine And Related Other (See Comments)    Memory loss  . Tetracyclines & Related Other (See Comments)    syncope  . Tramadol Other (See Comments)    seizures  . Ciprofloxacin Rash  . Penicillins Hives and Rash     Review of Systems  All other  systems reviewed and are negative.       Objective:   Physical Exam  Vitals reviewed. Constitutional: She appears well-developed and well-nourished.  Cardiovascular: Normal rate, regular rhythm, normal heart sounds and intact distal pulses.  Exam reveals no gallop.   No murmur heard. Pulmonary/Chest: Effort normal and breath sounds normal. No respiratory distress. She has no wheezes. She has no rales. She exhibits no tenderness.  Abdominal: Soft. Bowel sounds are normal. She exhibits no distension and no mass. There is no tenderness. There is no rebound and no guarding.          Assessment & Plan:  1. Abdominal  pain, other specified site Obtain x-rays of the abdomen to rule out bowel traction given her persistent nausea and also free air.  However I feel this is most likely viral gastroenteritis. - DG Abd 2 Views; Future  2. Viral gastroenteritis Phenergan 25 mg IM  x1. I then called prescription for Zofran 4 mg tablets every 8 hours when necessary nausea or vomiting.  I recommended a brat diet and asked the patient to push fluids such as Gatorade.

## 2012-09-12 ENCOUNTER — Telehealth: Payer: Self-pay | Admitting: Family Medicine

## 2012-10-11 NOTE — Telephone Encounter (Signed)
This was done.

## 2012-11-01 ENCOUNTER — Telehealth: Payer: Self-pay | Admitting: Family Medicine

## 2012-11-01 NOTE — Telephone Encounter (Signed)
?   Has she just been on antibiotics ? If yes, then can assume yeast infxn. If not, then NTBS so can do wet prep.

## 2012-11-02 NOTE — Telephone Encounter (Signed)
Left patient mess. Needs to be seen unless just completed course of antibiotics (no record in epic)  Asked her to call back and make appt or let us know about antibiotic

## 2013-06-21 IMAGING — CR DG CHEST 2V
2 series · 2 of 2 positions shown · non-contrast
Comparison: Chest x-ray of 07/30/2010

CLINICAL DATA: Syncopal episode

CHEST - 2 VIEW

[w chest lat]
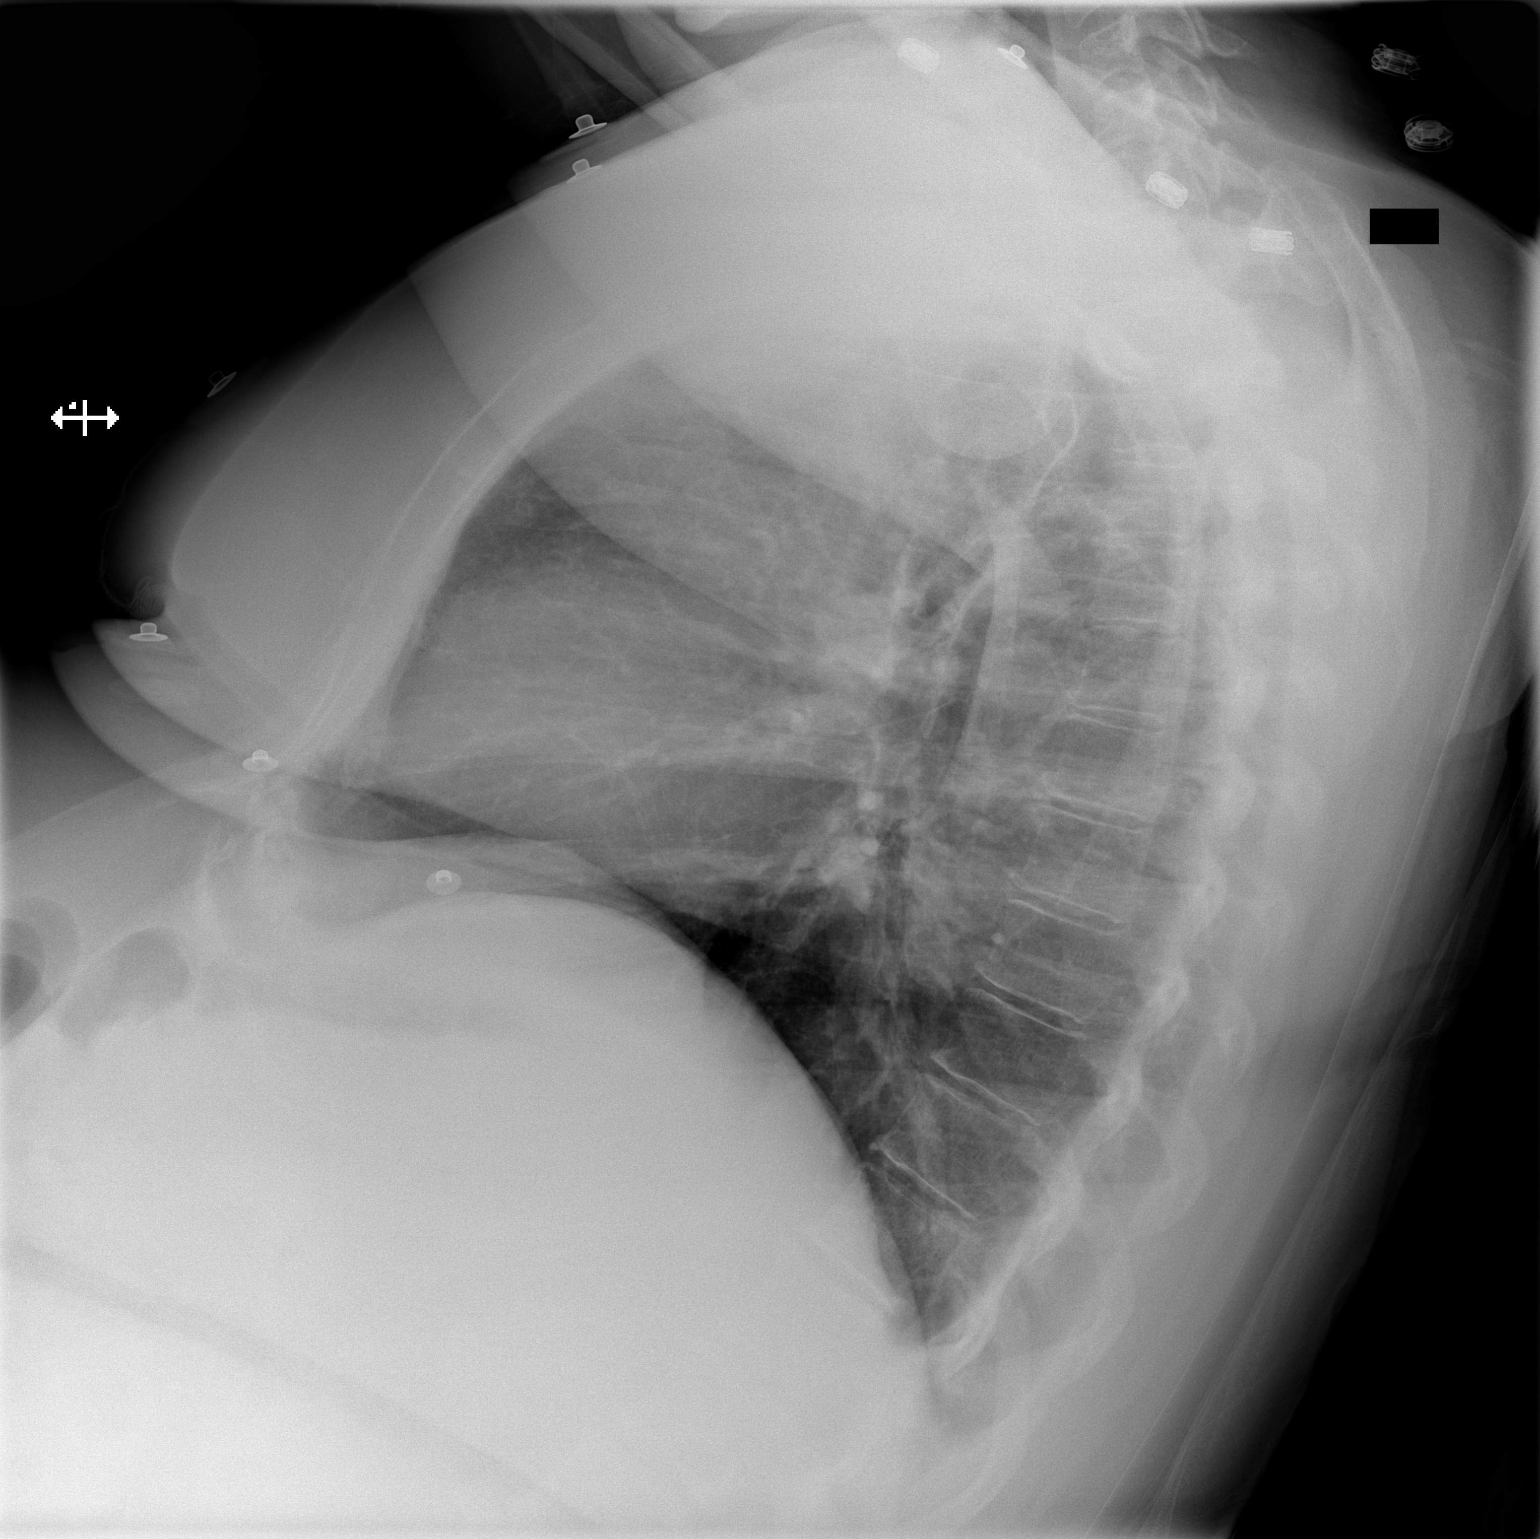

[x chest ap]
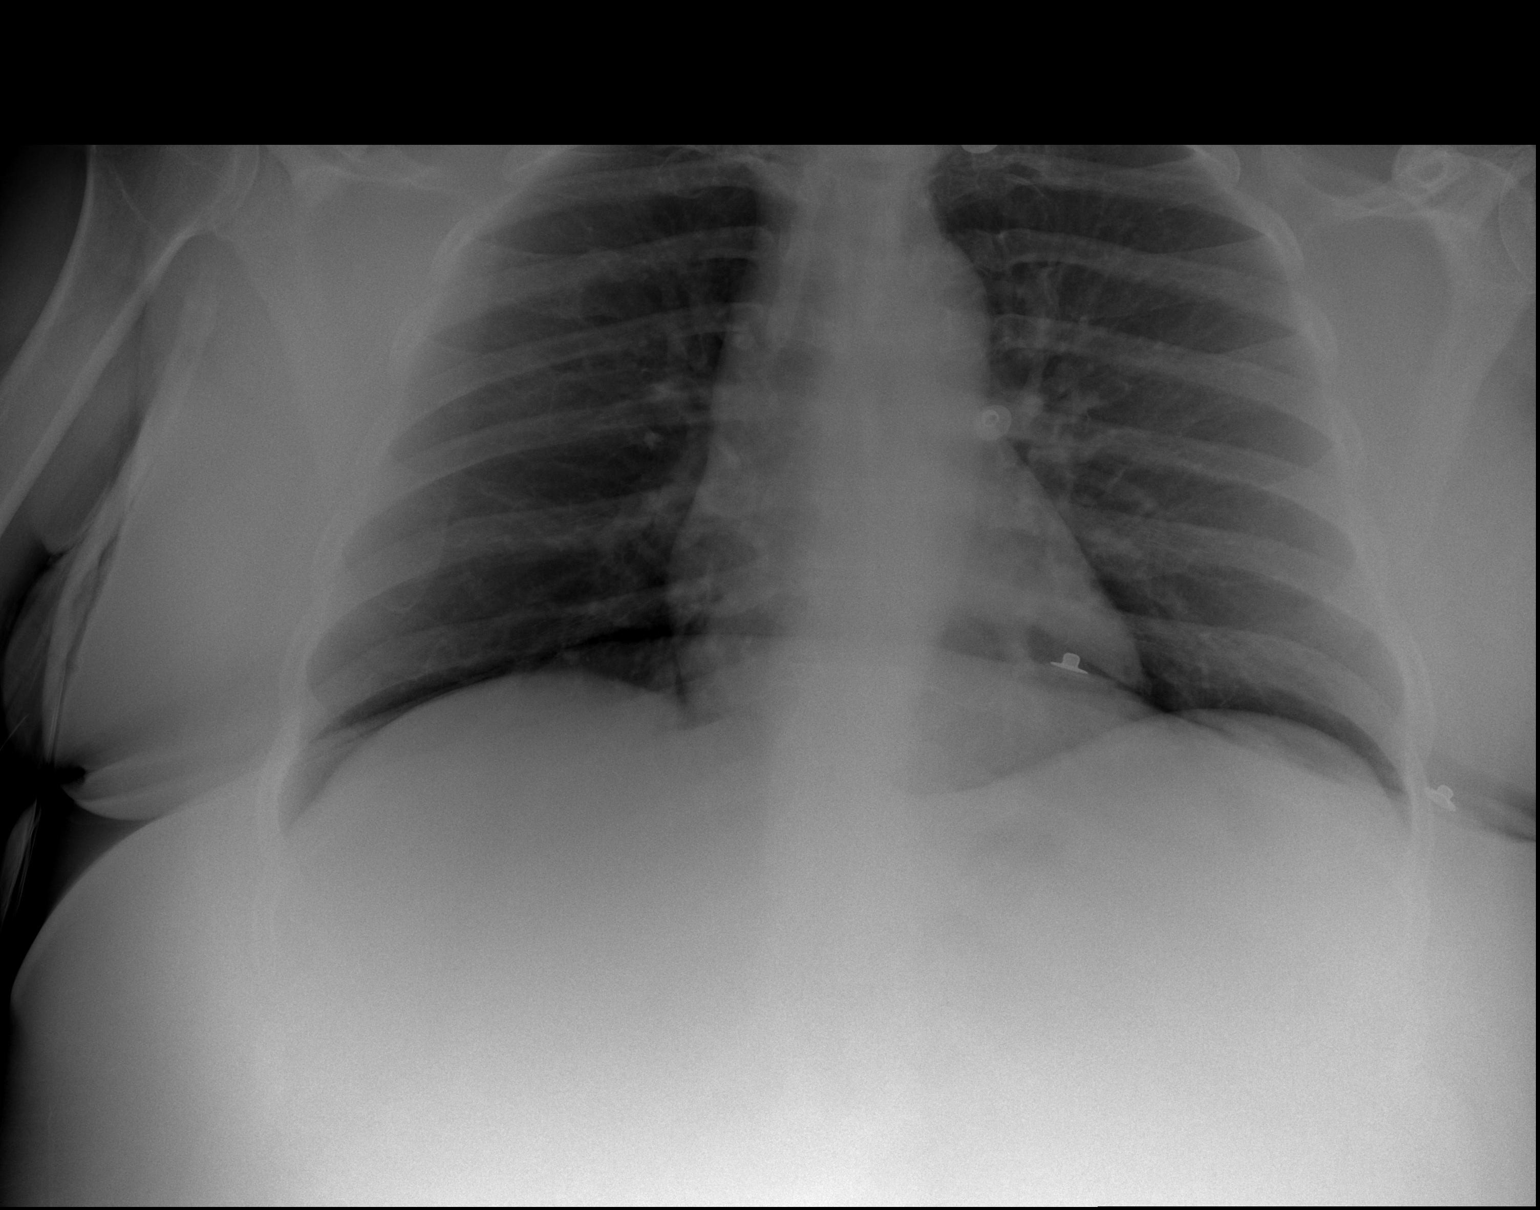

[2 of 2 positions shown; findings below may reference images not displayed]

FINDINGS: No active infiltrate or effusion is seen.  Mediastinal
contours appear stable.  The heart is within upper limits of
normal.  No bony abnormality is seen.
IMPRESSION: Stable chest x-ray.  No active lung disease.

## 2013-06-28 ENCOUNTER — Other Ambulatory Visit: Payer: Self-pay | Admitting: Family Medicine

## 2013-06-28 MED ORDER — FLUTICASONE PROPIONATE 50 MCG/ACT NA SUSP: 2.0000 | Freq: Every day | NASAL | Status: DC

## 2013-06-28 MED ORDER — PANTOPRAZOLE SODIUM 40 MG PO TBEC: 40.0000 mg | DELAYED_RELEASE_TABLET | Freq: Every day | ORAL | Status: DC

## 2013-06-28 NOTE — Telephone Encounter (Signed)
Rx Refilled  

## 2013-07-02 ENCOUNTER — Emergency Department (INDEPENDENT_AMBULATORY_CARE_PROVIDER_SITE_OTHER)
Admission: EM | Admit: 2013-07-02 | Discharge: 2013-07-02 | Disposition: A | Discharge status: Home / Self Care | Payer: Medicare Other | Source: Home / Self Care | Attending: Family Medicine | Admitting: Family Medicine

## 2013-07-02 ENCOUNTER — Encounter (HOSPITAL_COMMUNITY): Payer: Self-pay | Admitting: Emergency Medicine

## 2013-07-02 DIAGNOSIS — K5289 Other specified noninfective gastroenteritis and colitis: Secondary | ICD-10-CM

## 2013-07-02 DIAGNOSIS — K529 Noninfective gastroenteritis and colitis, unspecified: Secondary | ICD-10-CM

## 2013-07-02 MED ORDER — ONDANSETRON HCL 4 MG/2ML IJ SOLN
4.0000 mg | Freq: Once | INTRAMUSCULAR | Status: AC
  Administered 2013-07-02: 4 mg via INTRAVENOUS

## 2013-07-02 MED ORDER — ONDANSETRON HCL 4 MG PO TABS: 4.0000 mg | ORAL_TABLET | Freq: Four times a day (QID) | ORAL | Status: DC

## 2013-07-02 MED ORDER — DEXTROSE 5 % AND 0.45 % NACL IV BOLUS
1000.0000 mL | Freq: Once | INTRAVENOUS | Status: AC
  Administered 2013-07-02: 1000 mL via INTRAVENOUS

## 2013-07-02 MED ORDER — ONDANSETRON HCL 4 MG/2ML IJ SOLN
INTRAMUSCULAR | Status: AC
  Filled 2013-07-02: qty 2

## 2013-07-02 NOTE — Discharge Instructions (Signed)
Clear liquid , bland diet tonight as tolerated, advance on tues as improved, use medicine as needed, imodium for diarrhea, return or see your doctor if any problems.

## 2013-07-02 NOTE — ED Notes (Signed)
48 yr old female is here today with complaints of N/V/D. She states the Nausea has been since Friday, Vomiting has been since Saturday and the diarrhea has been since Friday. She states she has tried OTC Tylenol and nothing else. Denies: fever, SOB, Chest Pain

## 2013-07-02 NOTE — ED Provider Notes (Signed)
CSN: [id number]     Arrival date & time 07/02/13  1347 History   First MD Initiated Contact with Patient 07/02/13 1439     No chief complaint on file.  (Consider location/radiation/quality/duration/timing/severity/associated sxs/prior Treatment) Patient is a 48 y.o. female presenting with vomiting. The history is provided by the patient and a parent.  Emesis Severity:  Moderate Duration:  4 days Timing:  Intermittent Quality:  Stomach contents Progression:  Unchanged Chronicity:  New Recent urination:  Normal Context comment:  Diarrhea last was this am, last emesis was 2 hr ago. ,no blood seen. Relieved by:  None tried Worsened by:  Nothing tried Ineffective treatments:  None tried Associated symptoms: diarrhea   Associated symptoms: no abdominal pain and no fever   Risk factors: sick contacts     Past Medical History  Diagnosis Date  . Hypertension   . Asthma   . Sleep apnea   . Anxiety   . Depression   . Bipolar 1 disorder     ect treatments last treatment Sep 02 1011  . GERD (gastroesophageal reflux disease)    Past Surgical History  Procedure Laterality Date  . Abdominal hysterectomy    . Back surgery    . Laproscopic knee surgery     Family History  Problem Relation Age of Onset  . Diabetes Mother   . COPD Mother   . Hypertension Mother   . Hyperlipidemia Mother   . Heart disease Father   . Hyperlipidemia Father   . Hypertension Father   . Heart disease Brother   . Heart disease Daughter    History  Substance Use Topics  . Smoking status: Never Smoker   . Smokeless tobacco: Not on file  . Alcohol Use: No   OB History   Grav Para Term Preterm Abortions TAB SAB Ect Mult Living                 Review of Systems  Constitutional: Negative.   Gastrointestinal: Positive for nausea, vomiting and diarrhea. Negative for abdominal pain, constipation and blood in stool.  Genitourinary: Negative.   Musculoskeletal: Negative.   Skin: Negative.      Allergies  Codeine; Morphine and related; Tetracyclines & related; Tramadol; Ciprofloxacin; and Penicillins  Home Medications   Current Outpatient Rx  Name  Route  Sig  Dispense  Refill  . Calcium Carbonate-Vit D-Min (CALCIUM 1200 PO)   Oral   Take 1 tablet by mouth daily.         . citalopram (CELEXA) 40 MG tablet   Oral   Take 40 mg by mouth at bedtime. For depresion.         . fluticasone (FLONASE) 50 MCG/ACT nasal spray   Each Nare   Place 2 sprays into both nostrils daily.   48 g   3   . gabapentin (NEURONTIN) 600 MG tablet   Oral   Take 1 tablet (600 mg total) by mouth 3 (three) times daily. For anxiety and pain management. May have to stop to take ECT.   90 tablet   0   . haloperidol (HALDOL) 5 MG tablet   Oral   Take 1 tablet (5 mg total) by mouth 2 (two) times daily. For psychosis   60 tablet   0   . HYDROcodone-acetaminophen (NORCO/VICODIN) 5-325 MG per tablet   Oral   Take 1 tablet by mouth every 6 (six) hours as needed for pain.         . meloxicam (  MOBIC) 15 MG tablet   Oral   Take 15 mg by mouth daily.         . methocarbamol (ROBAXIN) 750 MG tablet   Oral   Take 750 mg by mouth 4 (four) times daily.         . pantoprazole (PROTONIX) 40 MG tablet   Oral   Take 1 tablet (40 mg total) by mouth daily. For control of stomach acid secretion and helps GERD.   90 tablet   3   . prazosin (MINIPRESS) 5 MG capsule   Oral   Take 5 mg by mouth at bedtime.         . SUMAtriptan (IMITREX) 100 MG tablet   Oral   Take 1 tablet (100 mg total) by mouth every 2 (two) hours as needed. migranes   10 tablet   2   . EXPIRED: albuterol (PROVENTIL HFA;VENTOLIN HFA) 108 (90 BASE) MCG/ACT inhaler   Inhalation   Inhale 2 puffs into the lungs every 4 (four) hours as needed. For wheezing         . folic acid (FOLVITE) 063 MCG tablet   Oral   Take 400 mcg by mouth daily.         . ondansetron (ZOFRAN) 4 MG tablet   Oral   Take 1 tablet (4 mg  total) by mouth every 8 (eight) hours as needed for nausea.   20 tablet   0   . ondansetron (ZOFRAN) 4 MG tablet   Oral   Take 1 tablet (4 mg total) by mouth every 6 (six) hours.   6 tablet   0    BP 148/116  Pulse 85  Temp(Src) 99.2 F (37.3 C) (Oral)  Resp 20 Physical Exam  Nursing note and vitals reviewed. Constitutional: She is oriented to person, place, and time. She appears well-developed and well-nourished.  Neck: Normal range of motion. Neck supple.  Pulmonary/Chest: Breath sounds normal.  Abdominal: Soft. Bowel sounds are normal. She exhibits no distension and no mass. There is no tenderness. There is no rebound and no guarding.  Lymphadenopathy:    She has no cervical adenopathy.  Neurological: She is alert and oriented to person, place, and time.  Skin: Skin is warm and dry.    ED Course  Procedures (including critical care time) Labs Review Labs Reviewed - No data to display Imaging Review No results found.   MDM   1. Gastroenteritis, acute    Sx improved with ivf.    Billy Fischer, MD 07/02/13 [phone]

## 2013-07-04 ENCOUNTER — Ambulatory Visit: Payer: Medicare Other | Admitting: Physician Assistant

## 2013-07-05 ENCOUNTER — Encounter: Payer: Self-pay | Admitting: Physician Assistant

## 2013-07-05 ENCOUNTER — Ambulatory Visit (INDEPENDENT_AMBULATORY_CARE_PROVIDER_SITE_OTHER): Payer: Medicare Other | Admitting: Physician Assistant

## 2013-07-05 VITALS — BP 126/88 | HR 72 | Temp 98.1°F | Resp 18 | Ht 63.5 in | Wt 224.0 lb

## 2013-07-05 DIAGNOSIS — K2970 Gastritis, unspecified, without bleeding: Secondary | ICD-10-CM

## 2013-07-05 DIAGNOSIS — K2990 Gastroduodenitis, unspecified, without bleeding: Secondary | ICD-10-CM

## 2013-07-05 MED ORDER — PROMETHAZINE HCL 25 MG PO TABS: 25.0000 mg | ORAL_TABLET | Freq: Three times a day (TID) | ORAL | Status: DC | PRN

## 2013-07-05 NOTE — Progress Notes (Signed)
Patient ID: [name] [name] MRN: [id number], DOB: 1965/11/28, 48 y.o. Date of Encounter: 07/05/2013, 11:38 AM    Chief Complaint:  Chief Complaint  Patient presents with  . still sick  seen UC Monday    vomiting and diarrhea     HPI: 48 y.o. year old white female  reports that symptoms started Friday 06/29/13. That day she had some nausea and also diarrhea. Following day, Saturday 06/30/13 she also developed vomiting. She went to the [facility] urgent care on Monday 07/02/13. I did review his records in the computer. They felt that she had a viral gastroenteritis. They gave her IV fluids and IV Zofran. Also gave her prescription for #6 of Zofran to use at home. Patient says that the Zofran never worked. Says that she has used Phenergan in the past and that worked well for her. She reports that the diarrhea has resolved. She has had no diarrhea since Monday 07/02/13. SHe reports that she is continuing to have nausea and continuing with vomiting. Yesterday she had 4 episodes of emesis. says she has only had a low-grade fever and otherwise no fever. Says that she has had history of appendectomy.     Home Meds: See attached medication section for any medications that were entered at today's visit. The computer does not put those onto this list.The following list is a list of meds entered prior to today's visit.   Current Outpatient Prescriptions on File Prior to Visit  Medication Sig Dispense Refill  . albuterol (PROVENTIL HFA;VENTOLIN HFA) 108 (90 BASE) MCG/ACT inhaler Inhale 2 puffs into the lungs every 4 (four) hours as needed. For wheezing      . Calcium Carbonate-Vit D-Min (CALCIUM 1200 PO) Take 1 tablet by mouth daily.      . citalopram (CELEXA) 40 MG tablet Take 40 mg by mouth at bedtime. For depresion.      . fluticasone (FLONASE) 50 MCG/ACT nasal spray Place 2 sprays into both nostrils daily.  48 g  3  . folic acid (FOLVITE) 093 MCG tablet Take 400 mcg by mouth daily.      Marland Kitchen gabapentin  (NEURONTIN) 600 MG tablet Take 1 tablet (600 mg total) by mouth 3 (three) times daily. For anxiety and pain management. May have to stop to take ECT.  90 tablet  0  . haloperidol (HALDOL) 5 MG tablet Take 1 tablet (5 mg total) by mouth 2 (two) times daily. For psychosis  60 tablet  0  . meloxicam (MOBIC) 15 MG tablet Take 15 mg by mouth daily.      . methocarbamol (ROBAXIN) 750 MG tablet Take 750 mg by mouth 4 (four) times daily.      . ondansetron (ZOFRAN) 4 MG tablet Take 1 tablet (4 mg total) by mouth every 6 (six) hours.  6 tablet  0  . pantoprazole (PROTONIX) 40 MG tablet Take 1 tablet (40 mg total) by mouth daily. For control of stomach acid secretion and helps GERD.  90 tablet  3  . prazosin (MINIPRESS) 5 MG capsule Take 5 mg by mouth at bedtime.      . SUMAtriptan (IMITREX) 100 MG tablet Take 1 tablet (100 mg total) by mouth every 2 (two) hours as needed. migranes  10 tablet  2  . [DISCONTINUED] benztropine (COGENTIN) 1 MG tablet Take 1 tablet (1 mg total) by mouth 2 (two) times daily. For stiffness from the Haldol  60 tablet  0  . [DISCONTINUED] lithium carbonate (LITHOBID) 300 MG  CR tablet Take 1 tablet (300 mg total) by mouth every 12 (twelve) hours. For mood swings and depression  60 tablet  0   No current facility-administered medications on file prior to visit.    Allergies:  Allergies  Allergen Reactions  . Codeine Itching  . Morphine And Related Other (See Comments)    Memory loss  . Tetracyclines & Related Other (See Comments)    syncope  . Tramadol Other (See Comments)    seizures  . Ciprofloxacin Rash  . Penicillins Hives and Rash      Review of Systems: See HPI for pertinent ROS. All other ROS negative.    Physical Exam: Blood pressure 126/88, pulse 72, temperature 98.1 F (36.7 C), temperature source Oral, resp. rate 18, height 5' 3.5" (1.613 m), weight 224 lb (101.606 kg)., Body mass index is 39.05 kg/(m^2). General: Obese white female. Appears in no acute  distress. Neck: Supple. No thyromegaly. No lymphadenopathy. Lungs: Clear bilaterally to auscultation without wheezes, rales, or rhonchi. Breathing is unlabored. Heart: Regular rhythm. No murmurs, rubs, or gallops. Abdomen: Soft, non-distended with normoactive bowel sounds. No hepatomegaly. No rebound/guarding. No obvious abdominal masses. She reports some mild tenderness with palpation along the upper abdomen bilaterally. No other area of tenderness with palpation. Msk:  Strength and tone normal for age. Extremities/Skin: Warm and dry. Neuro: Alert and oriented X 3. Moves all extremities spontaneously. Gait is normal. CNII-XII grossly in tact. Psych:  Responds to questions appropriately with a normal affect.     ASSESSMENT AND PLAN:  48 y.o. year old female with  1. Viral gastritis Continue clear liquid diet. Recommended ginger ale. Drink small sips frequently to prevent dehydration and prevent vomiting. Also can have chicken noodle soup Jell-O and plain crackers. Follow up if vomiting does not stop in 72 more hours. F/U if symptoms worsen significantly in the interim. - promethazine (PHENERGAN) 25 MG tablet; Take 1 tablet (25 mg total) by mouth every 8 (eight) hours as needed for nausea or vomiting.  Dispense: 30 tablet; Refill: 0   Signed, [address] [place], [place], [facility] 07/05/2013 11:38 AM

## 2013-07-09 ENCOUNTER — Ambulatory Visit: Payer: Medicare Other | Admitting: Family Medicine

## 2013-07-09 ENCOUNTER — Ambulatory Visit (INDEPENDENT_AMBULATORY_CARE_PROVIDER_SITE_OTHER): Payer: Medicare Other | Admitting: Family Medicine

## 2013-07-09 ENCOUNTER — Encounter: Payer: Self-pay | Admitting: Family Medicine

## 2013-07-09 VITALS — BP 130/94 | HR 86 | Temp 97.5°F | Resp 18 | Ht 63.5 in | Wt 223.0 lb

## 2013-07-09 DIAGNOSIS — K219 Gastro-esophageal reflux disease without esophagitis: Secondary | ICD-10-CM

## 2013-07-09 MED ORDER — ALBUTEROL SULFATE HFA 108 (90 BASE) MCG/ACT IN AERS: 2.0000 | INHALATION_SPRAY | RESPIRATORY_TRACT | Status: DC | PRN

## 2013-07-09 NOTE — Progress Notes (Signed)
Subjective:    Patient ID: [name] [name], female    DOB: Apr 09, 1966, 48 y.o.   MRN: [id number]  HPI  Patient is currently on protonix 40 mg by mouth daily for acid reflux. 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 Over the last month she's developed worsening acid reflux and indigestion. She states it is made worse by eating food. It does not matter the type of food she eats even mild food will upset her stomach. She denies any melena or hematochezia. She denies any right upper quadrant pain. She does still have her gallbladder but she denies any symptoms that sound like biliary colic. The patient independently tried increasing her protonix to 40 mg by mouth twice a day and this has helped substantially with her symptoms. Past Medical History  Diagnosis Date  . Hypertension   . Asthma   . Sleep apnea   . Anxiety   . Depression   . Bipolar 1 disorder     ect treatments last treatment Sep 02 1011  . GERD (gastroesophageal reflux disease)    Current Outpatient Prescriptions on File Prior to Visit  Medication Sig Dispense Refill  . Calcium Carbonate-Vit D-Min (CALCIUM 1200 PO) Take 1 tablet by mouth daily.      . citalopram (CELEXA) 40 MG tablet Take 40 mg by mouth at bedtime. For depresion.      . fluticasone (FLONASE) 50 MCG/ACT nasal spray Place 2 sprays into both nostrils daily.  48 g  3  . folic acid (FOLVITE) 425 MCG tablet Take 400 mcg by mouth daily.      Marland Kitchen gabapentin (NEURONTIN) 600 MG tablet Take 1 tablet (600 mg total) by mouth 3 (three) times daily. For anxiety and pain management. May have to stop to take ECT.  90 tablet  0  . haloperidol (HALDOL) 5 MG tablet Take 1 tablet (5 mg total) by mouth 2 (two) times daily. For psychosis  60 tablet  0  . meloxicam (MOBIC) 15 MG tablet Take 15 mg by mouth daily.      . methocarbamol (ROBAXIN) 750 MG tablet Take 750 mg by mouth 4 (four) times daily.      . pantoprazole (PROTONIX)  40 MG tablet Take 1 tablet (40 mg total) by mouth daily. For control of stomach acid secretion and helps GERD.  90 tablet  3  . prazosin (MINIPRESS) 5 MG capsule Take 5 mg by mouth at bedtime.      . promethazine (PHENERGAN) 25 MG tablet Take 1 tablet (25 mg total) by mouth every 8 (eight) hours as needed for nausea or vomiting.  30 tablet  0  . SUMAtriptan (IMITREX) 100 MG tablet Take 1 tablet (100 mg total) by mouth every 2 (two) hours as needed. migranes  10 tablet  2  . VOLTAREN 1 % GEL Apply topically daily.      . [DISCONTINUED] benztropine (COGENTIN) 1 MG tablet Take 1 tablet (1 mg total) by mouth 2 (two) times daily. For stiffness from the Haldol  60 tablet  0  . [DISCONTINUED] lithium carbonate (LITHOBID) 300 MG CR tablet Take 1 tablet (300 mg total) by mouth every 12 (twelve) hours. For mood swings and depression  60 tablet  0   No current facility-administered medications on file prior to visit.   Allergies  Allergen Reactions  . Codeine Itching  . Morphine And Related Other (See Comments)    Memory loss  .  Tetracyclines & Related Other (See Comments)    syncope  . Tramadol Other (See Comments)    seizures  . Ciprofloxacin Rash  . Penicillins Hives and Rash   History   Social History  . Marital Status: Legally Separated    Spouse Name: N/A    Number of Children: N/A  . Years of Education: N/A   Occupational History  . Not on file.   Social History Main Topics  . Smoking status: Never Smoker   . Smokeless tobacco: Not on file  . Alcohol Use: No  . Drug Use: No  . Sexual Activity: No   Other Topics Concern  . Not on file   Social History Narrative  . No narrative on file     Review of Systems  All other systems reviewed and are negative.       Objective:   Physical Exam  Vitals reviewed. Cardiovascular: Normal rate, regular rhythm and normal heart sounds.   Pulmonary/Chest: Effort normal and breath sounds normal. No respiratory distress. She has no  wheezes. She has no rales.  Abdominal: Soft. Bowel sounds are normal. She exhibits no distension. There is no tenderness. There is no rebound and no guarding.          Assessment & Plan:  1. GERD (gastroesophageal reflux disease) Increase pantoprazole to 40 mg by mouth twice a day. I warned the patient about chronic use of NSAIDs and the gastrointestinal side effects of such. I recommended that she decrease her consumption of meloxicam and try supplementing with Tylenol instead.  If symptoms persist, I would recommend switching to dexilant.

## 2013-07-12 ENCOUNTER — Ambulatory Visit: Payer: Medicare Other | Admitting: Family Medicine

## 2013-07-31 ENCOUNTER — Ambulatory Visit: Payer: Medicare Other | Admitting: Family Medicine

## 2013-09-05 ENCOUNTER — Telehealth: Payer: Self-pay | Admitting: Family Medicine

## 2013-09-05 MED ORDER — SUMATRIPTAN SUCCINATE 100 MG PO TABS: 100.0000 mg | ORAL_TABLET | ORAL | Status: DC | PRN

## 2013-09-05 NOTE — Telephone Encounter (Signed)
Rx Refilled  

## 2013-09-05 NOTE — Telephone Encounter (Signed)
[address]  Patient needs rx for Imitrex If possible

## 2013-11-19 ENCOUNTER — Ambulatory Visit (INDEPENDENT_AMBULATORY_CARE_PROVIDER_SITE_OTHER): Payer: Medicare Other | Admitting: Family Medicine

## 2013-11-19 ENCOUNTER — Encounter: Payer: Self-pay | Admitting: Family Medicine

## 2013-11-19 VITALS — BP 106/72 | HR 92 | Temp 97.2°F | Resp 18 | Ht 63.5 in | Wt 224.0 lb

## 2013-11-19 DIAGNOSIS — K5900 Constipation, unspecified: Secondary | ICD-10-CM

## 2013-11-19 DIAGNOSIS — K5909 Other constipation: Secondary | ICD-10-CM

## 2013-11-19 NOTE — Progress Notes (Signed)
Subjective:    Patient ID: [name] [name], female    DOB: 1965-11-15, 48 y.o.   MRN: [id number]  HPI Patient presents with 3 days of abdominal discomfort. The pain occurs in spasms. It is located in her lower pelvis. Is steady with nausea. She's been extremely constipated. She has not had a bowel movement in 3 days. She denies been on our hematochezia. She denies any vomiting. She denies any fever. She is taking stool softeners with a laxative over-the-counter. She is unable to go to the bathroom unless she uses a stool softener with laxative. She is not sure what brand she is using. Past Medical History  Diagnosis Date  . Hypertension   . Asthma   . Sleep apnea   . Anxiety   . Depression   . Bipolar 1 disorder     ect treatments last treatment Sep 02 1011  . GERD (gastroesophageal reflux disease)    Current Outpatient Prescriptions on File Prior to Visit  Medication Sig Dispense Refill  . albuterol (PROVENTIL HFA;VENTOLIN HFA) 108 (90 BASE) MCG/ACT inhaler Inhale 2 puffs into the lungs every 4 (four) hours as needed. For wheezing  18 g  2  . Calcium Carbonate-Vit D-Min (CALCIUM 1200 PO) Take 1 tablet by mouth daily.      . citalopram (CELEXA) 40 MG tablet Take 40 mg by mouth at bedtime. For depresion.      . fluticasone (FLONASE) 50 MCG/ACT nasal spray Place 2 sprays into both nostrils daily.  48 g  3  . gabapentin (NEURONTIN) 600 MG tablet Take 1 tablet (600 mg total) by mouth 3 (three) times daily. For anxiety and pain management. May have to stop to take ECT.  90 tablet  0  . haloperidol (HALDOL) 5 MG tablet Take 1 tablet (5 mg total) by mouth 2 (two) times daily. For psychosis  60 tablet  0  . meloxicam (MOBIC) 15 MG tablet Take 15 mg by mouth daily.      . methocarbamol (ROBAXIN) 750 MG tablet Take 750 mg by mouth 4 (four) times daily.      . pantoprazole (PROTONIX) 40 MG tablet Take 1 tablet (40 mg total) by mouth daily. For control of stomach acid secretion and helps GERD.  90  tablet  3  . prazosin (MINIPRESS) 5 MG capsule Take 5 mg by mouth at bedtime.      . promethazine (PHENERGAN) 25 MG tablet Take 1 tablet (25 mg total) by mouth every 8 (eight) hours as needed for nausea or vomiting.  30 tablet  0  . SUMAtriptan (IMITREX) 100 MG tablet Take 1 tablet (100 mg total) by mouth every 2 (two) hours as needed. migranes  10 tablet  2  . VOLTAREN 1 % GEL Apply topically daily.      . [DISCONTINUED] lithium carbonate (LITHOBID) 300 MG CR tablet Take 1 tablet (300 mg total) by mouth every 12 (twelve) hours. For mood swings and depression  60 tablet  0   No current facility-administered medications on file prior to visit.   Allergies  Allergen Reactions  . Codeine Itching  . Morphine And Related Other (See Comments)    Memory loss  . Tetracyclines & Related Other (See Comments)    syncope  . Tramadol Other (See Comments)    seizures  . Ciprofloxacin Rash  . Penicillins Hives and Rash   History   Social History  . Marital Status: Legally Separated    Spouse Name: N/A  Number of Children: N/A  . Years of Education: N/A   Occupational History  . Not on file.   Social History Main Topics  . Smoking status: Never Smoker   . Smokeless tobacco: Not on file  . Alcohol Use: No  . Drug Use: No  . Sexual Activity: No   Other Topics Concern  . Not on file   Social History Narrative  . No narrative on file      Review of Systems  All other systems reviewed and are negative.      Objective:   Physical Exam  Vitals reviewed. Cardiovascular: Normal rate, regular rhythm and normal heart sounds.   No murmur heard. Pulmonary/Chest: Effort normal and breath sounds normal. No respiratory distress. She has no wheezes. She has no rales.  Abdominal: Soft. She exhibits no distension. Bowel sounds are decreased. There is no tenderness. There is no rebound and no guarding.          Assessment & Plan:  1. Chronic constipation Trial of amitiza 24 mcg  poibd and recheck in 1 week or sooner if worse.  DC over-the-counter laxative. I explained to the patient these can become habit forming if abused.

## 2013-11-27 ENCOUNTER — Other Ambulatory Visit: Payer: Self-pay | Admitting: Orthopaedic Surgery

## 2013-11-27 DIAGNOSIS — M47816 Spondylosis without myelopathy or radiculopathy, lumbar region: Secondary | ICD-10-CM

## 2013-12-03 ENCOUNTER — Telehealth: Payer: Self-pay | Admitting: Family Medicine

## 2013-12-03 NOTE — Telephone Encounter (Signed)
Patient is calling to say that the amiteza we gave her samples for is working good,and would like to get rx for this if possible  [facility] [address]

## 2013-12-04 MED ORDER — LUBIPROSTONE 24 MCG PO CAPS: 24.0000 ug | ORAL_CAPSULE | Freq: Two times a day (BID) | ORAL | Status: DC

## 2013-12-04 NOTE — Telephone Encounter (Signed)
Med sent to pharm 

## 2013-12-06 ENCOUNTER — Inpatient Hospital Stay
Admission: RE | Admit: 2013-12-06 | Discharge: 2013-12-06 | Disposition: A | Discharge status: Home / Self Care | Payer: Medicare Other | Source: Ambulatory Visit | Attending: Orthopaedic Surgery | Admitting: Orthopaedic Surgery

## 2013-12-06 ENCOUNTER — Other Ambulatory Visit: Payer: Medicare Other

## 2013-12-06 IMAGING — CR DG ABDOMEN 2V
3 series · 3 of 3 positions shown · non-contrast
Comparison: Abdomen films of 09/24/2010 and MR lumbar spine of
12/23/2011

CLINICAL DATA: Nausea, vomiting, diarrhea, abdominal pain for 1
week

ABDOMEN - 2 VIEW

[view not recorded (1 of 3)]
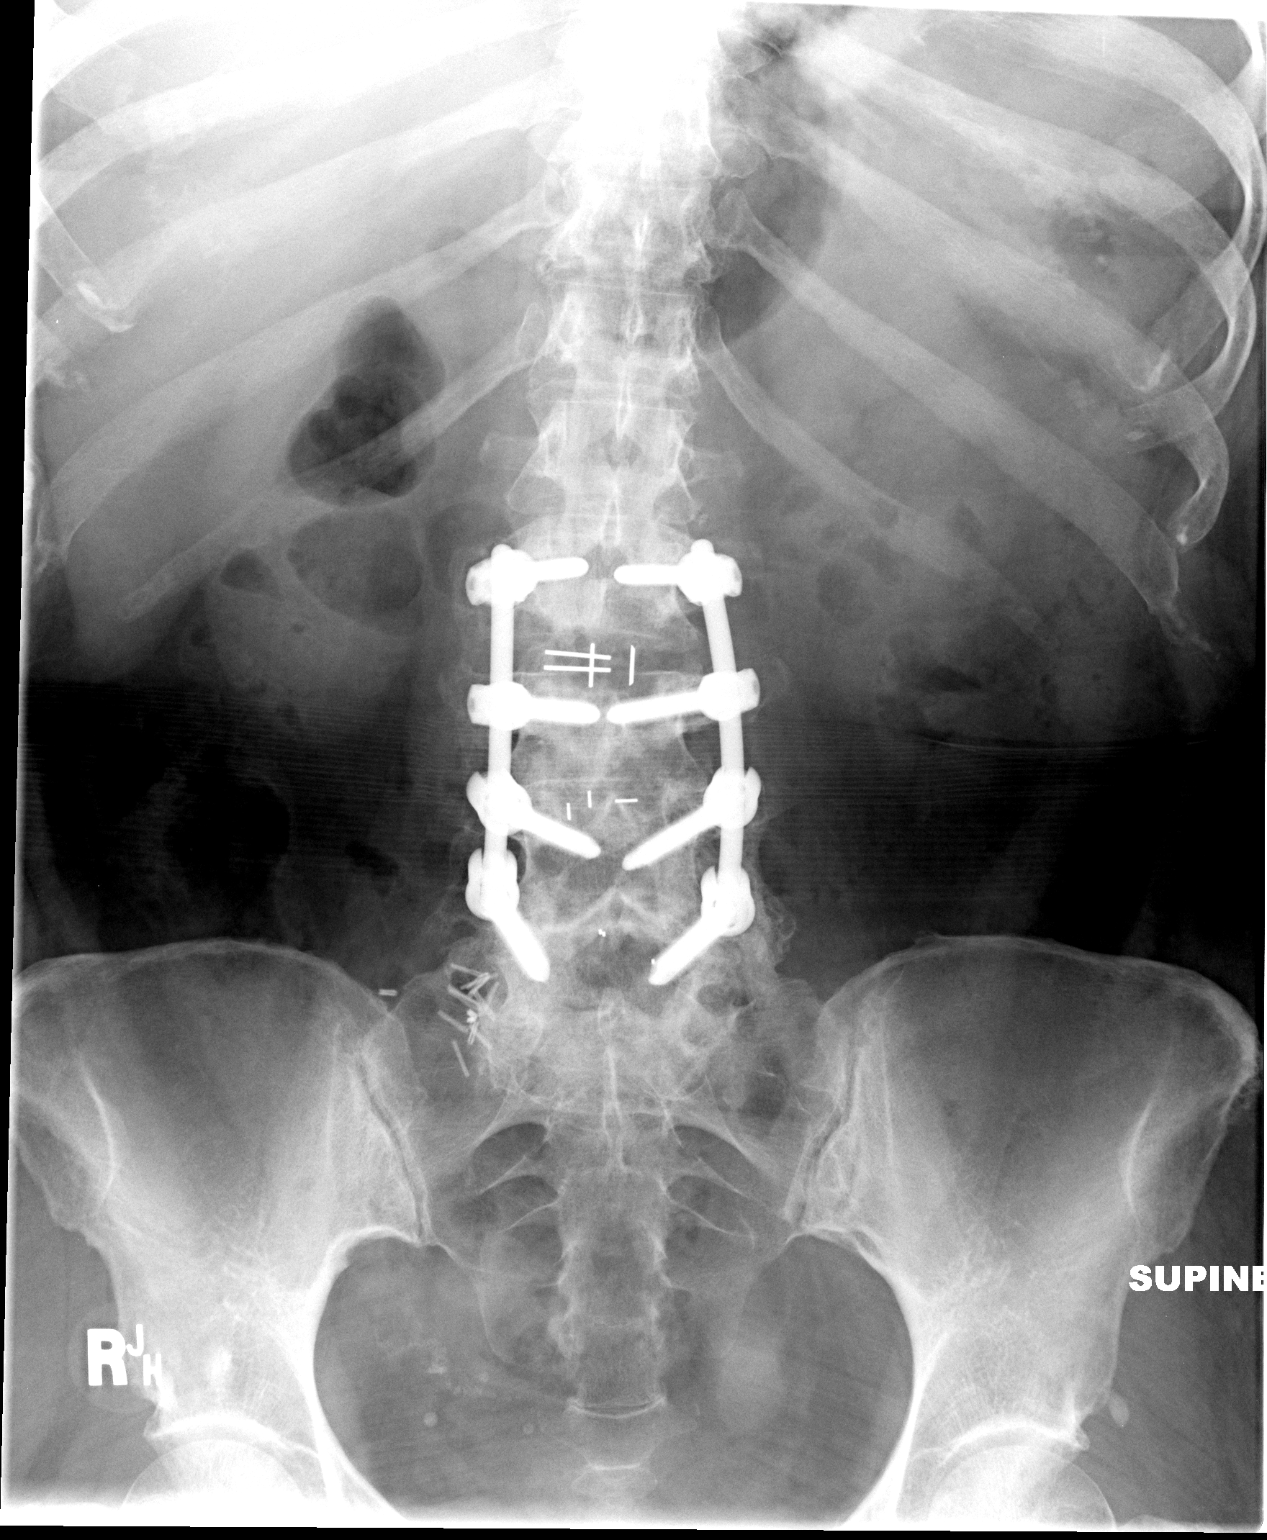

[view not recorded (2 of 3)]
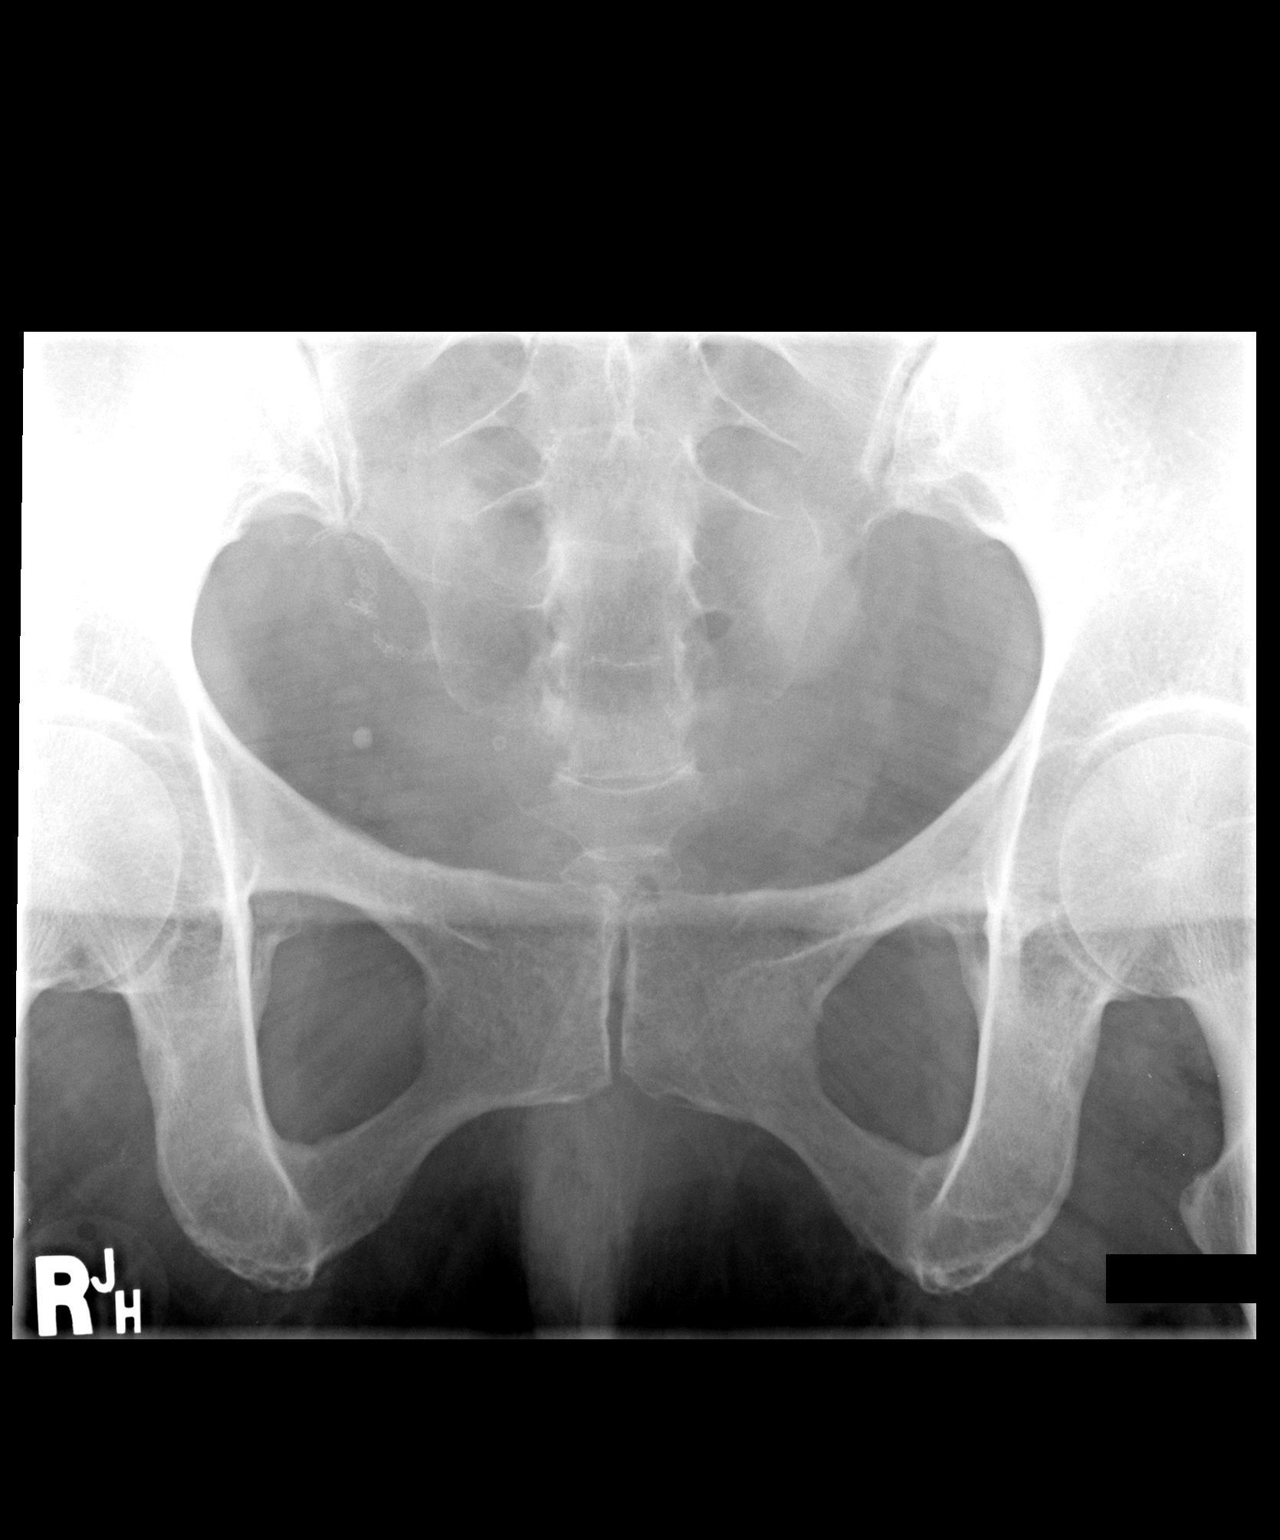

[view not recorded (3 of 3)]
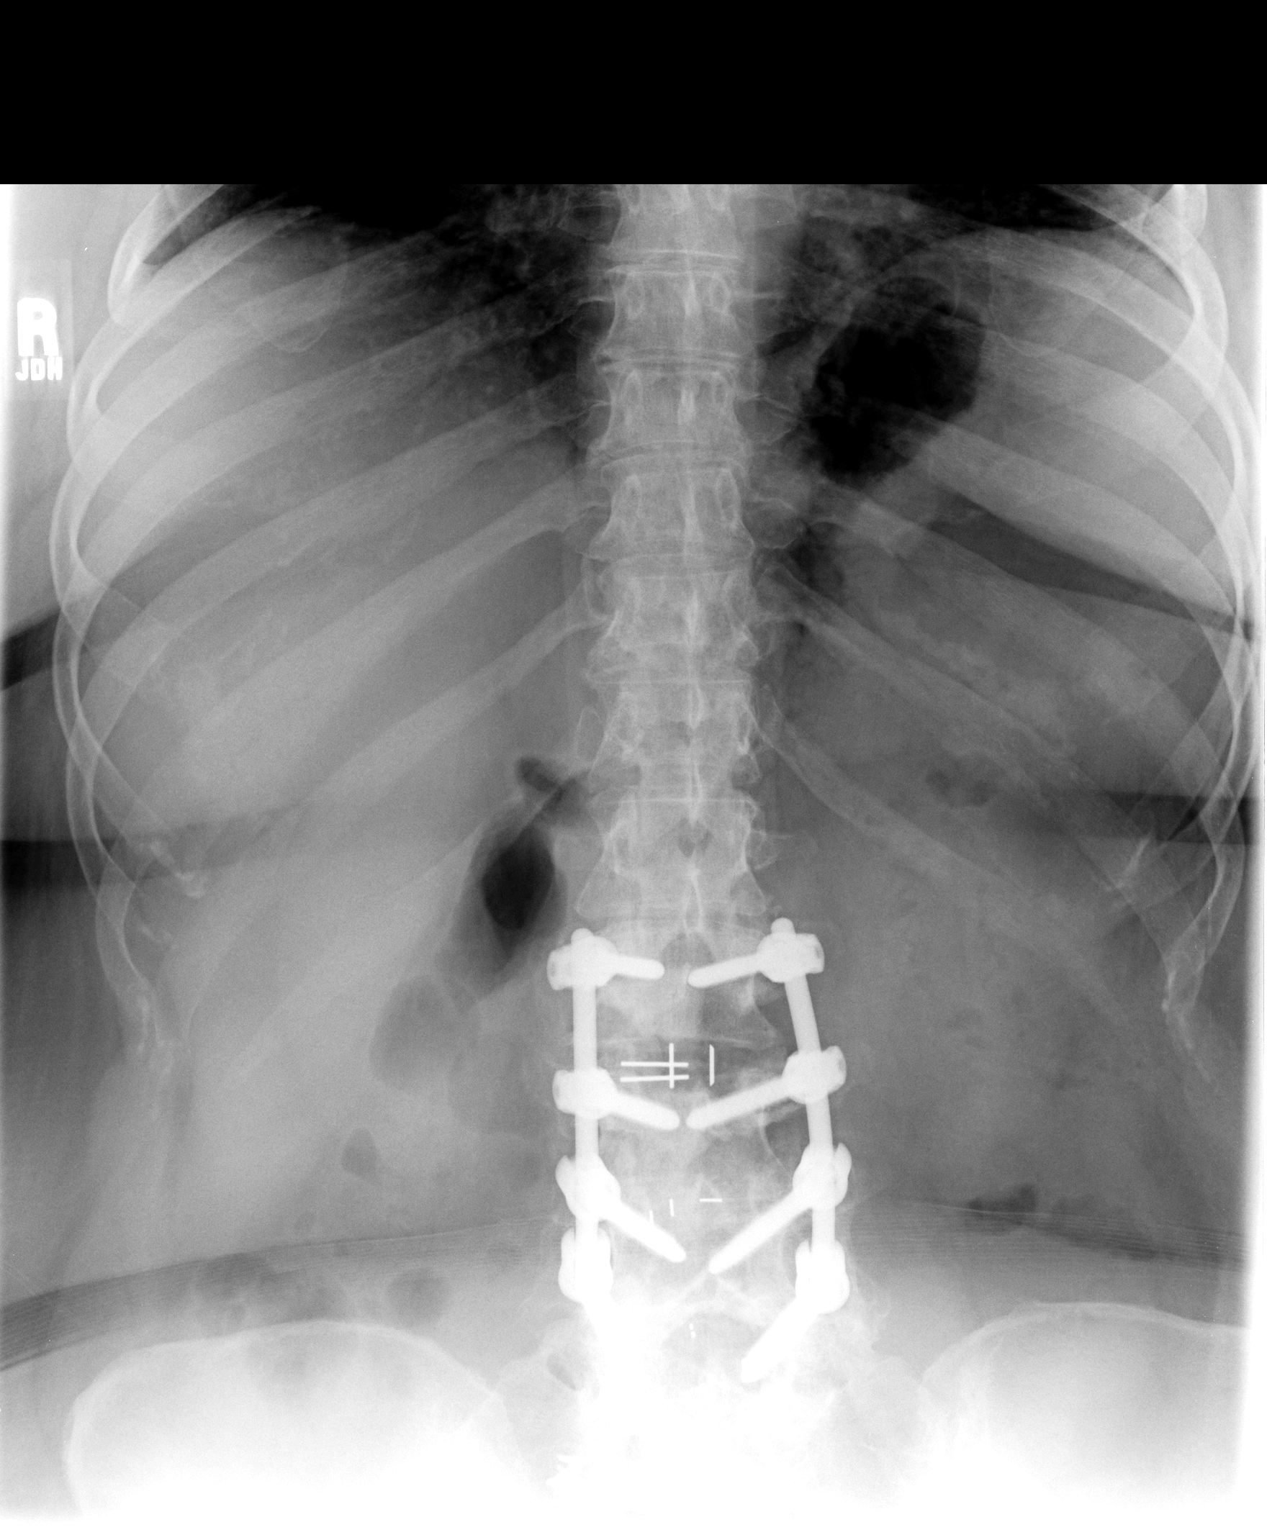

[3 of 3 positions shown; findings below may reference images not displayed]

FINDINGS: Hardware for fusion from L2-L5 is noted.  The bowel gas
pattern is nonspecific.  No opaque calculi are seen.  No free air
is noted on the erect view.
IMPRESSION: 1.  No bowel obstruction.  No free air.
2.  Hardware for fusion from L2-L5.

## 2013-12-06 NOTE — Discharge Instructions (Signed)
Myelogram Discharge Instructions  1. Go home and rest quietly for the next 24 hours.  It is important to lie flat for the next 24 hours.  Get up only to go to the restroom.  You may lie in the bed or on a couch on your back, your stomach, your left side or your right side.  You may have one pillow under your head.  You may have pillows between your knees while you are on your side or under your knees while you are on your back.  2. DO NOT drive today.  Recline the seat as far back as it will go, while still wearing your seat belt, on the way home.  3. You may get up to go to the bathroom as needed.  You may sit up for 10 minutes to eat.  You may resume your normal diet and medications unless otherwise indicated.  Drink lots of extra fluids today and tomorrow.  4. The incidence of headache, nausea, or vomiting is about 5% (one in 20 patients).  If you develop a headache, lie flat and drink plenty of fluids until the headache goes away.  Caffeinated beverages may be helpful.  If you develop severe nausea and vomiting or a headache that does not go away with flat bed rest, call [phone].  5. You may resume normal activities after your 24 hours of bed rest is over; however, do not exert yourself strongly or do any heavy lifting tomorrow. If when you get up you have a headache when standing, go back to bed and force fluids for another 24 hours.  6. Call your physician for a follow-up appointment.  The results of your myelogram will be sent directly to your physician by the following day.  7. If you have any questions or if complications develop after you arrive home, please call [phone].  Discharge instructions have been explained to the patient.  The patient, or the person responsible for the patient, fully understands these instructions.    may resume Haldol and Celexa on Aug. 21, 2015, after 11:00 am.

## 2013-12-12 ENCOUNTER — Ambulatory Visit
Admission: RE | Admit: 2013-12-12 | Discharge: 2013-12-12 | Disposition: A | Discharge status: Home / Self Care | Payer: Medicare Other | Source: Ambulatory Visit | Attending: Orthopaedic Surgery | Admitting: Orthopaedic Surgery

## 2013-12-12 VITALS — BP 102/50 | HR 74

## 2013-12-12 DIAGNOSIS — M47816 Spondylosis without myelopathy or radiculopathy, lumbar region: Secondary | ICD-10-CM

## 2013-12-12 MED ORDER — MEPERIDINE HCL 100 MG/ML IJ SOLN
100.0000 mg | Freq: Once | INTRAMUSCULAR | Status: AC
Start: 2013-12-12 — End: 2013-12-12
  Administered 2013-12-12: 100 mg via INTRAMUSCULAR

## 2013-12-12 MED ORDER — ONDANSETRON HCL 4 MG/2ML IJ SOLN
4.0000 mg | Freq: Once | INTRAMUSCULAR | Status: AC
  Administered 2013-12-12: 4 mg via INTRAMUSCULAR

## 2013-12-12 MED ORDER — IOHEXOL 300 MG/ML  SOLN
10.0000 mL | Freq: Once | INTRAMUSCULAR | Status: AC | PRN
  Administered 2013-12-12: 10 mL via INTRATHECAL

## 2013-12-12 MED ORDER — DIAZEPAM 5 MG PO TABS
10.0000 mg | ORAL_TABLET | Freq: Once | ORAL | Status: AC
  Administered 2013-12-12: 10 mg via ORAL

## 2013-12-12 NOTE — Progress Notes (Signed)
Dr. Jobe Igo wrote a prescription for some valium to get patient through the next 24 hours off her Celexa and Haldol after her myelogram here today.  He also okayed patient resuming these two medications at 8:00am tomorrow instead of 1:00pm.  Brita Romp, RN

## 2013-12-12 NOTE — Discharge Instructions (Signed)
Myelogram Discharge Instructions  1. Go home and rest quietly for the next 24 hours.  It is important to lie flat for the next 24 hours.  Get up only to go to the restroom.  You may lie in the bed or on a couch on your back, your stomach, your left side or your right side.  You may have one pillow under your head.  You may have pillows between your knees while you are on your side or under your knees while you are on your back.  2. DO NOT drive today.  Recline the seat as far back as it will go, while still wearing your seat belt, on the way home.  3. You may get up to go to the bathroom as needed.  You may sit up for 10 minutes to eat.  You may resume your normal diet and medications unless otherwise indicated.  Drink lots of extra fluids today and tomorrow.  4. The incidence of headache, nausea, or vomiting is about 5% (one in 20 patients).  If you develop a headache, lie flat and drink plenty of fluids until the headache goes away.  Caffeinated beverages may be helpful.  If you develop severe nausea and vomiting or a headache that does not go away with flat bed rest, call [phone].  5. You may resume normal activities after your 24 hours of bed rest is over; however, do not exert yourself strongly or do any heavy lifting tomorrow. If when you get up you have a headache when standing, go back to bed and force fluids for another 24 hours.  6. Call your physician for a follow-up appointment.  The results of your myelogram will be sent directly to your physician by the following day.  7. If you have any questions or if complications develop after you arrive home, please call [phone].  Discharge instructions have been explained to the patient.  The patient, or the person responsible for the patient, fully understands these instructions.      May resume Haldol and Celexa on Aug. 27, 2015, after 1:00 pm.

## 2013-12-12 NOTE — Progress Notes (Addendum)
Has been off haldol and celexa for the past 3 days. Discharge instructions explained to pt.

## 2014-01-30 ENCOUNTER — Other Ambulatory Visit: Payer: Self-pay | Admitting: Family Medicine

## 2014-03-01 ENCOUNTER — Other Ambulatory Visit: Payer: Self-pay | Admitting: Family Medicine

## 2014-04-16 ENCOUNTER — Ambulatory Visit: Payer: Medicare Other | Admitting: Family Medicine

## 2014-04-18 ENCOUNTER — Emergency Department (INDEPENDENT_AMBULATORY_CARE_PROVIDER_SITE_OTHER)
Admission: EM | Admit: 2014-04-18 | Discharge: 2014-04-18 | Disposition: A | Discharge status: Home / Self Care | Payer: Medicare Other | Source: Home / Self Care | Attending: Emergency Medicine | Admitting: Emergency Medicine

## 2014-04-18 ENCOUNTER — Encounter (HOSPITAL_COMMUNITY): Payer: Self-pay | Admitting: Emergency Medicine

## 2014-04-18 DIAGNOSIS — H6122 Impacted cerumen, left ear: Secondary | ICD-10-CM | POA: Diagnosis not present

## 2014-04-18 MED ORDER — NEOMYCIN-POLYMYXIN-HC 3.5-10000-1 OT SUSP: 4.0000 [drp] | Freq: Three times a day (TID) | OTIC | Status: DC

## 2014-04-18 NOTE — ED Provider Notes (Signed)
   Chief Complaint   Ear Fullness and Otalgia   History of Present Illness   [name] [name] is a 48 year old female who's had a two-day history of left ear congestion and pain. She denies any drainage from the ear. The right ear has felt normal. She's had no fever, headache, nasal congestion, rhinorrhea, sore throat, stiff neck, or adenopathy.  Review of Systems   Other than as noted above, the patient denies any of the following symptoms: Systemic:  No fevers or chills. Eye:  No redness, pain, discharge, itching, blurred vision, or diplopia. ENT:  No headache, nasal congestion, sneezing, itching, epistaxis, decreased hearing, ringing in ears, vertigo, or tinnitus.  No oral lesions, sore throat, or hoarseness. Neck:  No neck pain or adenopathy. Skin:  No rash or itching.  [place]   Past medical history, family history, social history, meds, and allergies were reviewed. She is allergic to multiple medications including morphine, codeine, tetracycline, Keflex, and tramadol. She has a history of hypertension, asthma, sleep apnea, anxiety, depression, bipolar disorders, and GERD. Current meds include Celexa, gabapentin, Amitiza, Flonase, Haldol, Mobic, Robaxin, Protonix, Minipress, Phenergan, and Imitrex.  Physical Examination     Vital signs:  BP 135/91 mmHg  Pulse 88  Temp(Src) 98.1 F (36.7 C) (Oral)  Resp 12  SpO2 97% General:  Alert and oriented.  In no distress.  Skin warm and dry. Eye:  PERRL, full EOMs, lids and conjunctiva normal.   ENT:  There was wax in the left ear canal occluding the tympanic membrane, right TM and canal are normal.  Nasal mucosa not congested and without drainage.  Mucous membranes moist, no oral lesions, normal dentition, pharynx clear.  No cranial or facial pain to palplation. There is no pain or swelling over the mastoid. Neck:  Supple, full ROM.  No adenopathy, tenderness or mass.  Thyroid normal. Lungs:  Breath sounds clear and equal bilaterally.  No  wheezes, rales or rhonchi. Heart:  Rhythm regular, without extrasystoles.  No gallops or murmers. Skin:  Clear, warm and dry.   [facility]   The cerumen impaction was irrigated out. The ear was examined thereafter. Canal and TM are normal and patient experienced relief of symptoms.  Assessment   The encounter diagnosis was Cerumen impaction, left.  Plan    1.  Meds:  The following meds were prescribed:   New Prescriptions   NEOMYCIN-POLYMYXIN-HYDROCORTISONE (CORTISPORIN) 3.5-10000-1 OTIC SUSPENSION    Place 4 drops into the left ear 3 (three) times daily.    2.  Patient Education/Counseling:  The patient was given appropriate handouts, self care instructions, and instructed in symptomatic relief.    3.  Follow up:  The patient was told to follow up here if no better in 3 to 4 days, or sooner if becoming worse in any way, and given some red flag symptoms such as ear pain or difficulty hearing which would prompt immediate return.       Harden Mo, MD 04/18/14 [phone]

## 2014-04-18 NOTE — ED Notes (Signed)
C/o  Fullness and pain in left ear.  States "I can't hear out of my left ear".   Denies fever and drainage.    On set  Of symptoms 12/30.

## 2014-04-18 NOTE — Discharge Instructions (Signed)
Cerumen Impaction °A cerumen impaction is when the wax in your ear forms a plug. This plug usually causes reduced hearing. Sometimes it also causes an earache or dizziness. Removing a cerumen impaction can be difficult and painful. The wax sticks to the ear canal. The canal is sensitive and bleeds easily. If you try to remove a heavy wax buildup with a cotton tipped swab, you may push it in further. °Irrigation with water, suction, and small ear curettes may be used to clear out the wax. If the impaction is fixed to the skin in the ear canal, ear drops may be needed for a few days to loosen the wax. People who build up a lot of wax frequently can use ear wax removal products available in your local drugstore. °SEEK MEDICAL CARE IF:  °You develop an earache, increased hearing loss, or marked dizziness. °Document Released: 05/13/2004 Document Revised: 06/28/2011 Document Reviewed: 07/03/2009 °[employer]® Patient Information ©2015 [employer]. This information is not intended to replace advice given to you by your health care provider. Make sure you discuss any questions you have with your health care provider. ° °

## 2014-05-27 ENCOUNTER — Other Ambulatory Visit: Payer: Self-pay | Admitting: Family Medicine

## 2014-05-31 ENCOUNTER — Ambulatory Visit (INDEPENDENT_AMBULATORY_CARE_PROVIDER_SITE_OTHER): Payer: Medicare Other | Admitting: Family Medicine

## 2014-05-31 ENCOUNTER — Encounter: Payer: Self-pay | Admitting: Family Medicine

## 2014-05-31 VITALS — BP 138/90 | HR 86 | Temp 98.0°F | Resp 18 | Ht 63.5 in | Wt 217.0 lb

## 2014-05-31 DIAGNOSIS — R1013 Epigastric pain: Secondary | ICD-10-CM

## 2014-05-31 DIAGNOSIS — G8929 Other chronic pain: Secondary | ICD-10-CM

## 2014-05-31 NOTE — Addendum Note (Signed)
Addended by: Olevia Perches on: 05/31/2014 01:12 PM   Modules accepted: Orders

## 2014-05-31 NOTE — Progress Notes (Signed)
Subjective:    Patient ID: [name] [name], female    DOB: 08/23/1965, 49 y.o.   MRN: [id number]  HPI Patient presents today with several years of abdominal pain. Pain is usually lower in her abdomen around the level of her umbilicus. It occurs after eating. It is also associated with constipation. She only has a bowel movement every 48 hours. This has improved since she started on Amitiza. The pain does seem to improve when she has a bowel movement. However she also has chronic epigastric abdominal pain which does not seem to improve with bowel movements. She is currently on proton asked which is helped some but the pain still persists. She denies any melena or hematochezia. She denies any fevers chills or weight loss. Past Medical History  Diagnosis Date  . Hypertension   . Asthma   . Sleep apnea   . Anxiety   . Depression   . Bipolar 1 disorder     ect treatments last treatment Sep 02 1011  . GERD (gastroesophageal reflux disease)    Past Surgical History  Procedure Laterality Date  . Abdominal hysterectomy    . Back surgery    . Laproscopic knee surgery     Current Outpatient Prescriptions on File Prior to Visit  Medication Sig Dispense Refill  . albuterol (PROVENTIL HFA;VENTOLIN HFA) 108 (90 BASE) MCG/ACT inhaler Inhale 2 puffs into the lungs every 4 (four) hours as needed. For wheezing 18 g 2  . benztropine (COGENTIN) 1 MG tablet Take 1 mg by mouth daily.    . Calcium Carbonate-Vit D-Min (CALCIUM 1200 PO) Take 1 tablet by mouth daily.    . citalopram (CELEXA) 40 MG tablet Take 40 mg by mouth at bedtime. For depresion.    . fluticasone (FLONASE) 50 MCG/ACT nasal spray PLACE 2 SPRAYS INTO BOTH NOSTRILS DAILY. 48 g 2  . gabapentin (NEURONTIN) 600 MG tablet Take 1 tablet (600 mg total) by mouth 3 (three) times daily. For anxiety and pain management. May have to stop to take ECT. 90 tablet 0  . haloperidol (HALDOL) 5 MG tablet Take 1 tablet (5 mg total) by mouth 2 (two) times  daily. For psychosis 60 tablet 0  . lubiprostone (AMITIZA) 24 MCG capsule Take 1 capsule (24 mcg total) by mouth 2 (two) times daily with a meal. 60 capsule 5  . methocarbamol (ROBAXIN) 750 MG tablet Take 750 mg by mouth 4 (four) times daily.    Marland Kitchen neomycin-polymyxin-hydrocortisone (CORTISPORIN) 3.5-10000-1 otic suspension Place 4 drops into the left ear 3 (three) times daily. 10 mL 0  . pantoprazole (PROTONIX) 40 MG tablet TAKE 1 TABLET BY MOUTH TWICE A DAY 60 tablet 11  . prazosin (MINIPRESS) 5 MG capsule Take 5 mg by mouth at bedtime.    . promethazine (PHENERGAN) 25 MG tablet Take 1 tablet (25 mg total) by mouth every 8 (eight) hours as needed for nausea or vomiting. 30 tablet 0  . SUMAtriptan (IMITREX) 100 MG tablet TAKE 1 TABLET (100 MG TOTAL) BY MOUTH EVERY 2 (TWO) HOURS AS NEEDED. MIGRANES 10 tablet 2  . VOLTAREN 1 % GEL Apply topically daily.    . [DISCONTINUED] lithium carbonate (LITHOBID) 300 MG CR tablet Take 1 tablet (300 mg total) by mouth every 12 (twelve) hours. For mood swings and depression 60 tablet 0   No current facility-administered medications on file prior to visit.   Allergies  Allergen Reactions  . Tetracyclines & Related Other (See Comments)    syncope  .  Tramadol Other (See Comments)    seizures  . Ciprofloxacin Rash  . Codeine Itching  . Morphine And Related Other (See Comments)    Memory loss  . Penicillins Hives and Rash   History   Social History  . Marital Status: Legally Separated    Spouse Name: N/A  . Number of Children: N/A  . Years of Education: N/A   Occupational History  . Not on file.   Social History Main Topics  . Smoking status: Never Smoker   . Smokeless tobacco: Not on file  . Alcohol Use: No  . Drug Use: No  . Sexual Activity: No   Other Topics Concern  . Not on file   Social History Narrative      Review of Systems  All other systems reviewed and are negative.      Objective:   Physical Exam  Cardiovascular:  Normal rate, regular rhythm and normal heart sounds.   No murmur heard. Pulmonary/Chest: Effort normal and breath sounds normal. No respiratory distress. She has no wheezes. She has no rales. She exhibits no tenderness.  Abdominal: Soft. Bowel sounds are normal. She exhibits no distension. There is no tenderness. There is no rebound and no guarding.  Vitals reviewed.         Assessment & Plan:  Abdominal pain, chronic, epigastric - Plan: Helicobacter pylori abs-IgG+IgA, bld  I believe the patient likely has a combination of irritable bowel syndrome, chronic constipation, and dyspepsia. I will check for H. pylori. If the patient is H pylori negative, I would focus on trying to improve the regularity of her bowel movements and also consider adding a probiotic for irritable bowel syndrome

## 2014-06-03 LAB — H. PYLORI BREATH TEST: H. pylori Breath Test: NOT DETECTED

## 2014-06-04 ENCOUNTER — Telehealth: Payer: Self-pay | Admitting: Family Medicine

## 2014-06-04 NOTE — Telephone Encounter (Signed)
Pt aware of lab results and provider recommendations.  Supplements added to med list.  Pt to follow up in one month

## 2014-06-04 NOTE — Telephone Encounter (Signed)
-----   Message from [name], MD sent at 06/04/2014  7:17 AM EST ----- Does not have H pylori.  Recommend restora 1 poqday for possible IBS  Also recommend she add colace 100 mg poqday to try to improve stool frequency.  Recheck in 1 month.

## 2014-06-11 ENCOUNTER — Other Ambulatory Visit: Payer: Self-pay | Admitting: Family Medicine

## 2014-06-11 NOTE — Telephone Encounter (Signed)
Refill appropriate and filled per protocol. 

## 2014-06-14 ENCOUNTER — Telehealth: Payer: Self-pay | Admitting: Family Medicine

## 2014-06-14 NOTE — Telephone Encounter (Signed)
Pt made aware of provider recommendations 

## 2014-06-14 NOTE — Telephone Encounter (Signed)
Pt has been taking Probiotic, Colace and amitiza twice a day.  Still has not moved bowels in over one week.  Very uncomfortable with abd cramping.  What does she need to do?

## 2014-06-14 NOTE — Telephone Encounter (Signed)
magnesium citrate OTC follow directions on bottle.

## 2014-06-25 ENCOUNTER — Ambulatory Visit (INDEPENDENT_AMBULATORY_CARE_PROVIDER_SITE_OTHER): Payer: Medicare Other | Admitting: Family Medicine

## 2014-06-25 ENCOUNTER — Encounter: Payer: Self-pay | Admitting: Family Medicine

## 2014-06-25 VITALS — BP 126/80 | HR 84 | Temp 97.8°F | Resp 16 | Ht 63.5 in | Wt 217.0 lb

## 2014-06-25 DIAGNOSIS — G8929 Other chronic pain: Secondary | ICD-10-CM | POA: Diagnosis not present

## 2014-06-25 DIAGNOSIS — K5900 Constipation, unspecified: Secondary | ICD-10-CM | POA: Diagnosis not present

## 2014-06-25 DIAGNOSIS — R1013 Epigastric pain: Secondary | ICD-10-CM | POA: Diagnosis not present

## 2014-06-25 DIAGNOSIS — K5909 Other constipation: Secondary | ICD-10-CM

## 2014-06-25 NOTE — Progress Notes (Signed)
Subjective:    Patient ID: [name] [name], female    DOB: 02/06/1966, 49 y.o.   MRN: [id number]  HPI 05/31/14 Patient presents today with several years of abdominal pain. Pain is usually lower in her abdomen around the level of her umbilicus. It occurs after eating. It is also associated with constipation. She only has a bowel movement every 48 hours. This has improved since she started on Amitiza. The pain does seem to improve when she has a bowel movement. However she also has chronic epigastric abdominal pain which does not seem to improve with bowel movements. She is currently on proton asked which is helped some but the pain still persists. She denies any melena or hematochezia. She denies any fevers chills or weight loss.  At that time, my plan was: I believe the patient likely has a combination of irritable bowel syndrome, chronic constipation, and dyspepsia. I will check for H. pylori. If the patient is H pylori negative, I would focus on trying to improve the regularity of her bowel movements and also consider adding a probiotic for irritable bowel syndrome  06/25/14 She is abdominal pain is no better. Now she has not had a bowel movement for 5 days. She has profound nausea and vomiting she tried magnesium citrate without any relief. She is on chronic narcotics which may be causing narcotic-induced chronic constipation which is likely exacerbating her abdominal pain Past Medical History  Diagnosis Date  . Hypertension   . Asthma   . Sleep apnea   . Anxiety   . Depression   . Bipolar 1 disorder     ect treatments last treatment Sep 02 1011  . GERD (gastroesophageal reflux disease)    Past Surgical History  Procedure Laterality Date  . Abdominal hysterectomy    . Back surgery    . Laproscopic knee surgery     Current Outpatient Prescriptions on File Prior to Visit  Medication Sig Dispense Refill  . albuterol (PROVENTIL HFA;VENTOLIN HFA) 108 (90 BASE) MCG/ACT inhaler Inhale 2  puffs into the lungs every 4 (four) hours as needed. For wheezing 18 g 2  . AMITIZA 24 MCG capsule TAKE 1 CAPSULE TWICE A DAY WITH A MEAL 60 capsule 5  . benztropine (COGENTIN) 1 MG tablet Take 1 mg by mouth daily.    . Calcium Carbonate-Vit D-Min (CALCIUM 1200 PO) Take 1 tablet by mouth daily.    . citalopram (CELEXA) 40 MG tablet Take 40 mg by mouth at bedtime. For depresion.    . docusate sodium (COLACE) 100 MG capsule Take 100 mg by mouth daily.    . fluticasone (FLONASE) 50 MCG/ACT nasal spray PLACE 2 SPRAYS INTO BOTH NOSTRILS DAILY. 48 g 2  . gabapentin (NEURONTIN) 600 MG tablet Take 1 tablet (600 mg total) by mouth 3 (three) times daily. For anxiety and pain management. May have to stop to take ECT. 90 tablet 0  . haloperidol (HALDOL) 5 MG tablet Take 1 tablet (5 mg total) by mouth 2 (two) times daily. For psychosis 60 tablet 0  . methocarbamol (ROBAXIN) 750 MG tablet Take 750 mg by mouth 4 (four) times daily.    Marland Kitchen neomycin-polymyxin-hydrocortisone (CORTISPORIN) 3.5-10000-1 otic suspension Place 4 drops into the left ear 3 (three) times daily. 10 mL 0  . pantoprazole (PROTONIX) 40 MG tablet TAKE 1 TABLET BY MOUTH TWICE A DAY 60 tablet 11  . prazosin (MINIPRESS) 5 MG capsule Take 5 mg by mouth at bedtime.    . Probiotic  Product (RESTORA PO) Take 1 capsule by mouth daily.    . promethazine (PHENERGAN) 25 MG tablet Take 1 tablet (25 mg total) by mouth every 8 (eight) hours as needed for nausea or vomiting. 30 tablet 0  . SUMAtriptan (IMITREX) 100 MG tablet TAKE 1 TABLET (100 MG TOTAL) BY MOUTH EVERY 2 (TWO) HOURS AS NEEDED. MIGRANES 10 tablet 2  . VOLTAREN 1 % GEL Apply topically daily.    . [DISCONTINUED] lithium carbonate (LITHOBID) 300 MG CR tablet Take 1 tablet (300 mg total) by mouth every 12 (twelve) hours. For mood swings and depression 60 tablet 0   No current facility-administered medications on file prior to visit.   Allergies  Allergen Reactions  . Tetracyclines & Related Other  (See Comments)    syncope  . Tramadol Other (See Comments)    seizures  . Ciprofloxacin Rash  . Codeine Itching  . Morphine And Related Other (See Comments)    Memory loss  . Penicillins Hives and Rash   History   Social History  . Marital Status: Legally Separated    Spouse Name: N/A  . Number of Children: N/A  . Years of Education: N/A   Occupational History  . Not on file.   Social History Main Topics  . Smoking status: Never Smoker   . Smokeless tobacco: Not on file  . Alcohol Use: No  . Drug Use: No  . Sexual Activity: No   Other Topics Concern  . Not on file   Social History Narrative      Review of Systems  All other systems reviewed and are negative.      Objective:   Physical Exam  Cardiovascular: Normal rate, regular rhythm and normal heart sounds.   No murmur heard. Pulmonary/Chest: Effort normal and breath sounds normal. No respiratory distress. She has no wheezes. She has no rales. She exhibits no tenderness.  Abdominal: Soft. Bowel sounds are normal. She exhibits no distension. There is no tenderness. There is no rebound and no guarding.  Vitals reviewed.         Assessment & Plan:  Chronic constipation  Abdominal pain, chronic, epigastric  Begin Movantik 25 mg poqam and recheck in 1 week.

## 2014-06-28 ENCOUNTER — Telehealth: Payer: Self-pay | Admitting: Family Medicine

## 2014-06-28 NOTE — Telephone Encounter (Signed)
Pt aware and verbalizes understanding  

## 2014-06-28 NOTE — Telephone Encounter (Signed)
If all of this has not worked, she could try miralax 4 times a day with fleets enema pr until bm occurs.

## 2014-06-28 NOTE — Telephone Encounter (Signed)
Patient is calling to let dr pickard know that the med he gave her for constipation is not working please call her back at      [phone]

## 2014-07-01 ENCOUNTER — Telehealth: Payer: Self-pay | Admitting: Family Medicine

## 2014-07-01 NOTE — Telephone Encounter (Signed)
Informed pt that she can take once/twice a day as needed and if she gets constipated again she can go back to taking QID until BM then can stop.

## 2014-07-01 NOTE — Telephone Encounter (Signed)
[phone]  Pt is needing to speak to you about murlax 4 times a day, she is wondering how long she does need to do that?

## 2014-08-01 ENCOUNTER — Other Ambulatory Visit: Payer: Self-pay | Admitting: *Deleted

## 2014-08-01 MED ORDER — PANTOPRAZOLE SODIUM 40 MG PO TBEC: 40.0000 mg | DELAYED_RELEASE_TABLET | Freq: Two times a day (BID) | ORAL | Status: DC

## 2014-08-01 MED ORDER — LUBIPROSTONE 24 MCG PO CAPS: ORAL_CAPSULE | ORAL | Status: DC

## 2014-08-01 NOTE — Telephone Encounter (Signed)
Received fax requesting refill on Amitiza and Pantoprazole with 90 day supply.   Refill appropriate and filled per protocol.

## 2014-10-01 ENCOUNTER — Other Ambulatory Visit: Payer: Self-pay | Admitting: Family Medicine

## 2014-10-04 ENCOUNTER — Ambulatory Visit
Admission: RE | Admit: 2014-10-04 | Discharge: 2014-10-04 | Disposition: A | Discharge status: Home / Self Care | Payer: Medicare Other | Source: Ambulatory Visit | Attending: Family Medicine | Admitting: Family Medicine

## 2014-10-04 ENCOUNTER — Ambulatory Visit (INDEPENDENT_AMBULATORY_CARE_PROVIDER_SITE_OTHER): Payer: Medicare Other | Admitting: Family Medicine

## 2014-10-04 ENCOUNTER — Encounter: Payer: Self-pay | Admitting: Family Medicine

## 2014-10-04 VITALS — BP 110/70 | HR 73 | Temp 98.2°F | Resp 18 | Ht 63.5 in | Wt 222.0 lb

## 2014-10-04 DIAGNOSIS — L03031 Cellulitis of right toe: Secondary | ICD-10-CM | POA: Diagnosis not present

## 2014-10-04 MED ORDER — SULFAMETHOXAZOLE-TRIMETHOPRIM 800-160 MG PO TABS: 1.0000 | ORAL_TABLET | Freq: Two times a day (BID) | ORAL | Status: DC

## 2014-10-04 NOTE — Progress Notes (Signed)
Subjective:    Patient ID: [name] [name], female    DOB: 06-04-1965, 49 y.o.   MRN: [id number]  HPI  Patient has a history of peripheral neuropathy. She has difficult time feeling anything in her feet. She reports that she has had thick dead calloused skin on the plantar surface of her first right MTP joint for a long time. However 2 weeks ago she developed a small opening/ulcer that was approximately 6 mm in diameter. Now the right great toe is becoming erythematous and tender. She has a pressure callus on the plantar surface of her first MTP joint. She has a 6 mm shallow ulcer in the center of his pressure callus. There is obviously cellulitis in the toes surrounding this area Past Medical History  Diagnosis Date  . Hypertension   . Asthma   . Sleep apnea   . Anxiety   . Depression   . Bipolar 1 disorder     ect treatments last treatment Sep 02 1011  . GERD (gastroesophageal reflux disease)    Past Surgical History  Procedure Laterality Date  . Abdominal hysterectomy    . Back surgery    . Laproscopic knee surgery     Current Outpatient Prescriptions on File Prior to Visit  Medication Sig Dispense Refill  . benztropine (COGENTIN) 1 MG tablet Take 1 mg by mouth daily.    . Calcium Carbonate-Vit D-Min (CALCIUM 1200 PO) Take 1 tablet by mouth daily.    . citalopram (CELEXA) 40 MG tablet Take 40 mg by mouth at bedtime. For depresion.    . docusate sodium (COLACE) 100 MG capsule Take 100 mg by mouth daily.    . fluticasone (FLONASE) 50 MCG/ACT nasal spray PLACE 2 SPRAYS INTO BOTH NOSTRILS DAILY. 48 g 2  . gabapentin (NEURONTIN) 600 MG tablet Take 1 tablet (600 mg total) by mouth 3 (three) times daily. For anxiety and pain management. May have to stop to take ECT. 90 tablet 0  . haloperidol (HALDOL) 5 MG tablet Take 1 tablet (5 mg total) by mouth 2 (two) times daily. For psychosis 60 tablet 0  . HYDROcodone-acetaminophen (NORCO) 10-325 MG per tablet Take 1 tablet by mouth every 6  (six) hours as needed.    . lubiprostone (AMITIZA) 24 MCG capsule TAKE 1 CAPSULE TWICE A DAY WITH A MEAL 180 capsule 2  . methocarbamol (ROBAXIN) 750 MG tablet Take 750 mg by mouth 4 (four) times daily.    Marland Kitchen neomycin-polymyxin-hydrocortisone (CORTISPORIN) 3.5-10000-1 otic suspension Place 4 drops into the left ear 3 (three) times daily. 10 mL 0  . pantoprazole (PROTONIX) 40 MG tablet Take 1 tablet (40 mg total) by mouth 2 (two) times daily. 180 tablet 2  . prazosin (MINIPRESS) 5 MG capsule Take 5 mg by mouth at bedtime.    Marland Kitchen PROAIR HFA 108 (90 BASE) MCG/ACT inhaler INHALE 2 PUFFS INTO THE LUNGS EVERY 4 HOURS AS NEEDED FOR WHEEZING 8.5 Inhaler 3  . Probiotic Product (RESTORA PO) Take 1 capsule by mouth daily.    . promethazine (PHENERGAN) 25 MG tablet Take 1 tablet (25 mg total) by mouth every 8 (eight) hours as needed for nausea or vomiting. 30 tablet 0  . SUMAtriptan (IMITREX) 100 MG tablet TAKE 1 TABLET (100 MG TOTAL) BY MOUTH EVERY 2 (TWO) HOURS AS NEEDED. MIGRANES 10 tablet 2  . VOLTAREN 1 % GEL Apply topically daily.    . [DISCONTINUED] lithium carbonate (LITHOBID) 300 MG CR tablet Take 1 tablet (300 mg total)  by mouth every 12 (twelve) hours. For mood swings and depression 60 tablet 0   No current facility-administered medications on file prior to visit.   Allergies  Allergen Reactions  . Tetracyclines & Related Other (See Comments)    syncope  . Tramadol Other (See Comments)    seizures  . Ciprofloxacin Rash  . Codeine Itching  . Morphine And Related Other (See Comments)    Memory loss  . Penicillins Hives and Rash   History   Social History  . Marital Status: Legally Separated    Spouse Name: N/A  . Number of Children: N/A  . Years of Education: N/A   Occupational History  . Not on file.   Social History Main Topics  . Smoking status: Never Smoker   . Smokeless tobacco: Not on file  . Alcohol Use: No  . Drug Use: No  . Sexual Activity: No   Other Topics Concern    . Not on file   Social History Narrative     Review of Systems  All other systems reviewed and are negative.      Objective:   Physical Exam  Cardiovascular: Normal rate, regular rhythm and normal heart sounds.   Pulmonary/Chest: Effort normal and breath sounds normal.  Skin: There is erythema.  Vitals reviewed.         Assessment & Plan:  Cellulitis of toe, right - Plan: sulfamethoxazole-trimethoprim (BACTRIM DS,SEPTRA DS) 800-160 MG per tablet, DG Foot Complete Right  Patient has a pressure callus on the plantar surface of her right great toe with a ulcer and subsequent secondary cellulitis. Begin Bactrim double strength tablets 1 by mouth twice a day for 10 days. I will send the patient for an x-ray of the foot to rule out signs of chronic osteomyelitis. Using a razor blade, I debrided the thick callus skin from around the ulcer, covered the ulcer with Silvadene and then wrapped the patient in a bandage. Recheck on Monday, if no better, proceed with an MRI to rule out osteomyelitis

## 2014-10-07 ENCOUNTER — Ambulatory Visit (INDEPENDENT_AMBULATORY_CARE_PROVIDER_SITE_OTHER): Payer: Medicare Other | Admitting: Family Medicine

## 2014-10-07 ENCOUNTER — Encounter: Payer: Self-pay | Admitting: Family Medicine

## 2014-10-07 VITALS — BP 126/80 | HR 78 | Temp 98.5°F | Resp 18 | Ht 63.5 in | Wt 222.0 lb

## 2014-10-07 DIAGNOSIS — R42 Dizziness and giddiness: Secondary | ICD-10-CM | POA: Diagnosis not present

## 2014-10-07 DIAGNOSIS — L03031 Cellulitis of right toe: Secondary | ICD-10-CM

## 2014-10-07 DIAGNOSIS — L89891 Pressure ulcer of other site, stage 1: Secondary | ICD-10-CM

## 2014-10-07 MED ORDER — HYDROCHLOROTHIAZIDE 25 MG PO TABS: 25.0000 mg | ORAL_TABLET | Freq: Every day | ORAL | Status: DC

## 2014-10-07 NOTE — Progress Notes (Signed)
Subjective:    Patient ID: [name] [name], female    DOB: 1965/09/29, 49 y.o.   MRN: [id number]  HPI  10/04/14 Patient has a history of peripheral neuropathy. She has difficult time feeling anything in her feet. She reports that she has had thick dead calloused skin on the plantar surface of her first right MTP joint for a long time. However 2 weeks ago she developed a small opening/ulcer that was approximately 6 mm in diameter. Now the right great toe is becoming erythematous and tender. She has a pressure callus on the plantar surface of her first MTP joint. She has a 6 mm shallow ulcer in the center of his pressure callus. There is obviously cellulitis in the toes surrounding this area.  At that time, my plan was: Patient has a pressure callus on the plantar surface of her right great toe with a ulcer and subsequent secondary cellulitis. Begin Bactrim double strength tablets 1 by mouth twice a day for 10 days. I will send the patient for an x-ray of the foot to rule out signs of chronic osteomyelitis. Using a razor blade, I debrided the thick callus skin from around the ulcer, covered the ulcer with Silvadene and then wrapped the patient in a bandage. Recheck on Monday, if no better, proceed with an MRI to rule out osteomyelitis  6/201/6 Xray revealed no evidence of osteomyelitis.  She is here for follow up.  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  Erythema has improved.  Patient continues to have a pressure ulcer with surrounding pre-ulcer callus on the plantar aspect of the right first MTP joint. She also complains of 6 months of vertigo. Seems to be related to position changes although it can also occur without position changes she also reports tinnitus and hearing loss Past Medical History  Diagnosis Date  . Hypertension   . Asthma   . Sleep apnea   . Anxiety   . Depression   . Bipolar 1 disorder     ect treatments last treatment Sep 02 1011  . GERD (gastroesophageal reflux disease)    Past Surgical History  Procedure Laterality Date  . Abdominal hysterectomy    . Back surgery    . Laproscopic knee surgery     Current Outpatient Prescriptions on File Prior to Visit  Medication Sig Dispense Refill  . benztropine (COGENTIN) 1 MG tablet Take 1 mg by mouth daily.    . Calcium Carbonate-Vit D-Min (CALCIUM 1200 PO) Take 1 tablet by mouth daily.    . citalopram (CELEXA) 40 MG tablet Take 40 mg by mouth at bedtime. For depresion.    . docusate sodium (COLACE) 100 MG capsule Take 100 mg by mouth daily.    . fluticasone (FLONASE) 50 MCG/ACT nasal spray PLACE 2 SPRAYS INTO BOTH NOSTRILS DAILY. 48 g 2  . gabapentin (NEURONTIN) 600 MG tablet Take 1 tablet (600 mg total) by mouth 3 (three) times daily. For anxiety and pain management. May have to stop to take ECT. 90 tablet 0  . haloperidol (HALDOL) 5 MG tablet Take 1 tablet (5 mg total) by mouth 2 (two) times daily. For psychosis 60 tablet 0  . HYDROcodone-acetaminophen (NORCO) 10-325 MG per tablet Take 1 tablet by mouth every 6 (six) hours as needed.    . lubiprostone (AMITIZA) 24 MCG capsule TAKE 1 CAPSULE TWICE A DAY WITH A MEAL 180 capsule 2  . methocarbamol (ROBAXIN) 750 MG tablet  Take 750 mg by mouth 4 (four) times daily.    Marland Kitchen neomycin-polymyxin-hydrocortisone (CORTISPORIN) 3.5-10000-1 otic suspension Place 4 drops into the left ear 3 (three) times daily. 10 mL 0  . pantoprazole (PROTONIX) 40 MG tablet Take 1 tablet (40 mg total) by mouth 2 (two) times daily. 180 tablet 2  . prazosin (MINIPRESS) 5 MG capsule Take 5 mg by mouth at bedtime.    Marland Kitchen PROAIR HFA 108 (90 BASE) MCG/ACT inhaler INHALE 2 PUFFS INTO THE LUNGS EVERY 4 HOURS AS NEEDED FOR WHEEZING 8.5 Inhaler 3  . Probiotic Product (RESTORA PO) Take 1 capsule by mouth daily.    . promethazine (PHENERGAN) 25 MG tablet Take 1 tablet (25 mg total) by mouth every 8 (eight) hours as needed for nausea or vomiting. 30 tablet 0  .  sulfamethoxazole-trimethoprim (BACTRIM DS,SEPTRA DS) 800-160 MG per tablet Take 1 tablet by mouth 2 (two) times daily. 20 tablet 0  . SUMAtriptan (IMITREX) 100 MG tablet TAKE 1 TABLET (100 MG TOTAL) BY MOUTH EVERY 2 (TWO) HOURS AS NEEDED. MIGRANES 10 tablet 2  . VOLTAREN 1 % GEL Apply topically daily.    . [DISCONTINUED] lithium carbonate (LITHOBID) 300 MG CR tablet Take 1 tablet (300 mg total) by mouth every 12 (twelve) hours. For mood swings and depression 60 tablet 0   No current facility-administered medications on file prior to visit.   Allergies  Allergen Reactions  . Tetracyclines & Related Other (See Comments)    syncope  . Tramadol Other (See Comments)    seizures  . Ciprofloxacin Rash  . Codeine Itching  . Morphine And Related Other (See Comments)    Memory loss  . Penicillins Hives and Rash   History   Social History  . Marital Status: Legally Separated    Spouse Name: N/A  . Number of Children: N/A  . Years of Education: N/A   Occupational History  . Not on file.   Social History Main Topics  . Smoking status: Never Smoker   . Smokeless tobacco: Not on file  . Alcohol Use: No  . Drug Use: No  . Sexual Activity: No   Other Topics Concern  . Not on file   Social History Narrative     Review of Systems  All other systems reviewed and are negative.      Objective:   Physical Exam  Cardiovascular: Normal rate, regular rhythm and normal heart sounds.   Pulmonary/Chest: Effort normal and breath sounds normal.  Skin: There is erythema.  Vitals reviewed.         Assessment & Plan:  Vertigo - Plan: hydrochlorothiazide (HYDRODIURIL) 25 MG tablet  Cellulitis of toe, right  Pressure ulcer of toe, stage 1 - Plan: Ambulatory referral to Podiatry  Patient's vertigo could be BPPV versus Mnire's disease. Given the triad of symptoms, I will try the patient on hydrochlorothiazide 25 mg by mouth daily. Recheck in one month if no better proceed with an MRI  of the brain and a possible ENT referral. Cellulitis on the great toe appears to be improving. There was no osteomyelitis seen on x-ray area therefore I'll arrange the patient to see an podiatrist given the pressure ulcer. I believe she would benefit from custom molded orthotics given her neuropathy to allow and facilitate healing of the pressure ulcer

## 2014-10-09 ENCOUNTER — Ambulatory Visit (INDEPENDENT_AMBULATORY_CARE_PROVIDER_SITE_OTHER): Payer: Medicare Other | Admitting: Podiatry

## 2014-10-09 ENCOUNTER — Encounter: Payer: Self-pay | Admitting: Podiatry

## 2014-10-09 VITALS — BP 125/83 | HR 103 | Resp 15

## 2014-10-09 DIAGNOSIS — M2041 Other hammer toe(s) (acquired), right foot: Secondary | ICD-10-CM | POA: Diagnosis not present

## 2014-10-09 DIAGNOSIS — M2031 Hallux varus (acquired), right foot: Secondary | ICD-10-CM

## 2014-10-09 NOTE — Progress Notes (Signed)
   Subjective:    Patient ID: [name] [name], female    DOB: [birth year], 49 y.o.   MRN: [id number]  HPI Pt presents with pressure type ulcer on her right great toe, has had this issue for 3 weeks now,. 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 She believes that the wound started after she noticed a cut to the area. She states that she previously had some purulent drainage however she was seen by her primary care physician who started her on Septra. She states that since then she has not had any purulent drainage. She does state there has been slight redness around the wound however denies any red streaking. She has been applying antibiotic ointment over the wound followed by a dressing daily. She denies any systemic complaints as fevers, chills, nausea, vomiting. No other complaints at this time.  Review of Systems  Gastrointestinal: Positive for constipation.  Psychiatric/Behavioral: The patient is nervous/anxious.   All other systems reviewed and are negative.      Objective:   Physical Exam  AAO 3, NAD DP/PT pulses palpable, CRT less than 3 seconds Protective sensation decreased with Simms Weinstein monofilament There is hallux malleus present on the right foot. At the distal aspect of the right hallux there is a annular ulceration with surrounding hyperkeratotic tissue. Upon debridement of the wound the wound measures proximally 0.6 x 0.6 cm with a granular wound base and is superficial. There is mild erythema directly around the wound however there is no ascending cellulitis, fluctuance, crepitus, malodor, drainage/purulence. No other open lesions or pre-ulcerative lesions identified bilaterally. No other areas of tenderness to bilateral lower x-rays. No other areas of edema, erythema, increase in warmth. No pain with calf compression, swelling, warmth, erythema.       Assessment & Plan:  49 year old female with right hallux laceration  due to underlying hallux malleus with localized infection -Previous x-rays which were obtained last week were reviewed with the patient. -Treatment options discussed including all alternatives, risks, and complications -Wound sharply debrided without any complications to healthy, bleeding, granular wound base. Silvadene was applied followed by dry sterile dressing. -Recommended to continue with washing the wound daily with anti-bacterial soap and dried thoroughly. Afterwards apply antibiotic ointment and a bandage. -Finish course of antibiotics -Discussed that once the wound heals possible surgical intervention to help straighten out the hallux to help prevent recurrence. -Closely monitor for any signs or symptoms of more severe infection and directed to call the office immediately should any occur or go to the ER. -Follow-up in 3 weeks or sooner if any problems are to arise. Also discussed that the redness is not improved within the next week to call the office for follow-up appointment sooner or if there is any worsening of the wound.  In the meantime I encouraged her to call the office with any questions, concerns, changes symptoms.

## 2014-10-09 NOTE — Patient Instructions (Addendum)
Continue daily dressing changes as discussed. Finish antibiotics  Monitor for any signs/symptoms of infection. Call the office immediately if any occur or go directly to the emergency room. Call with any questions/concerns.

## 2014-10-26 ENCOUNTER — Observation Stay (HOSPITAL_COMMUNITY)
Admission: EM | Admit: 2014-10-26 | Discharge: 2014-10-27 | Disposition: A | Discharge status: Home / Self Care | Payer: Medicare Other | Attending: Family Medicine | Admitting: Family Medicine

## 2014-10-26 ENCOUNTER — Emergency Department (HOSPITAL_COMMUNITY): Payer: Medicare Other

## 2014-10-26 ENCOUNTER — Encounter (HOSPITAL_COMMUNITY): Payer: Self-pay | Admitting: Emergency Medicine

## 2014-10-26 DIAGNOSIS — F3110 Bipolar disorder, current episode manic without psychotic features, unspecified: Secondary | ICD-10-CM | POA: Diagnosis present

## 2014-10-26 DIAGNOSIS — E876 Hypokalemia: Secondary | ICD-10-CM | POA: Diagnosis not present

## 2014-10-26 DIAGNOSIS — R079 Chest pain, unspecified: Secondary | ICD-10-CM | POA: Diagnosis not present

## 2014-10-26 DIAGNOSIS — R0789 Other chest pain: Secondary | ICD-10-CM | POA: Diagnosis not present

## 2014-10-26 DIAGNOSIS — J45909 Unspecified asthma, uncomplicated: Secondary | ICD-10-CM | POA: Diagnosis not present

## 2014-10-26 DIAGNOSIS — F419 Anxiety disorder, unspecified: Secondary | ICD-10-CM | POA: Diagnosis not present

## 2014-10-26 DIAGNOSIS — Z7951 Long term (current) use of inhaled steroids: Secondary | ICD-10-CM | POA: Diagnosis not present

## 2014-10-26 DIAGNOSIS — E669 Obesity, unspecified: Secondary | ICD-10-CM | POA: Diagnosis not present

## 2014-10-26 DIAGNOSIS — I208 Other forms of angina pectoris: Secondary | ICD-10-CM | POA: Diagnosis present

## 2014-10-26 DIAGNOSIS — G4700 Insomnia, unspecified: Secondary | ICD-10-CM | POA: Insufficient documentation

## 2014-10-26 DIAGNOSIS — K219 Gastro-esophageal reflux disease without esophagitis: Secondary | ICD-10-CM | POA: Diagnosis not present

## 2014-10-26 DIAGNOSIS — G4730 Sleep apnea, unspecified: Secondary | ICD-10-CM | POA: Diagnosis not present

## 2014-10-26 DIAGNOSIS — Z880 Allergy status to penicillin: Secondary | ICD-10-CM | POA: Insufficient documentation

## 2014-10-26 DIAGNOSIS — I2089 Other forms of angina pectoris: Secondary | ICD-10-CM | POA: Diagnosis present

## 2014-10-26 DIAGNOSIS — Z79899 Other long term (current) drug therapy: Secondary | ICD-10-CM | POA: Insufficient documentation

## 2014-10-26 DIAGNOSIS — I10 Essential (primary) hypertension: Secondary | ICD-10-CM | POA: Diagnosis not present

## 2014-10-26 DIAGNOSIS — F319 Bipolar disorder, unspecified: Secondary | ICD-10-CM | POA: Insufficient documentation

## 2014-10-26 DIAGNOSIS — R0602 Shortness of breath: Secondary | ICD-10-CM | POA: Diagnosis not present

## 2014-10-26 LAB — CBC WITH DIFFERENTIAL/PLATELET
Basophils Absolute: 0 10*3/uL (ref 0.0–0.1)
Basophils Relative: 0 % (ref 0–1)
Eosinophils Absolute: 0.1 10*3/uL (ref 0.0–0.7)
Eosinophils Relative: 1 % (ref 0–5)
HCT: 37.3 % (ref 36.0–46.0)
Hemoglobin: 12.4 g/dL (ref 12.0–15.0)
Lymphocytes Relative: 34 % (ref 12–46)
Lymphs Abs: 2 10*3/uL (ref 0.7–4.0)
MCH: 32.4 pg (ref 26.0–34.0)
MCHC: 33.2 g/dL (ref 30.0–36.0)
MCV: 97.4 fL (ref 78.0–100.0)
Monocytes Absolute: 0.4 10*3/uL (ref 0.1–1.0)
Monocytes Relative: 6 % (ref 3–12)
Neutro Abs: 3.4 10*3/uL (ref 1.7–7.7)
Neutrophils Relative %: 59 % (ref 43–77)
Platelets: 264 10*3/uL (ref 150–400)
RBC: 3.83 MIL/uL — ABNORMAL LOW (ref 3.87–5.11)
RDW: 13.1 % (ref 11.5–15.5)
WBC: 5.9 10*3/uL (ref 4.0–10.5)

## 2014-10-26 LAB — TROPONIN I
Troponin I: <0.03 ng/mL (ref <0.031)
Troponin I: <0.03 ng/mL (ref <0.031)

## 2014-10-26 LAB — BASIC METABOLIC PANEL
Anion gap: 11 (ref 5–15)
BUN: 9 mg/dL (ref 6–20)
CO2: 26 mmol/L (ref 22–32)
Calcium: 8.9 mg/dL (ref 8.9–10.3)
Chloride: 101 mmol/L (ref 101–111)
Creatinine, Ser: 0.7 mg/dL (ref 0.44–1.00)
GFR calc Af Amer: >60 mL/min (ref >60)
GFR calc non Af Amer: >60 mL/min (ref >60)
Glucose, Bld: 120 mg/dL — ABNORMAL HIGH (ref 65–99)
Potassium: 3.2 mmol/L — ABNORMAL LOW (ref 3.5–5.1)
Sodium: 138 mmol/L (ref 135–145)

## 2014-10-26 LAB — D-DIMER, QUANTITATIVE: D-Dimer, Quant: 1.51 ug/mL-FEU — ABNORMAL HIGH (ref 0.00–0.48)

## 2014-10-26 LAB — TSH: TSH: 1.534 u[IU]/mL (ref 0.350–4.500)

## 2014-10-26 MED ORDER — HYDROCHLOROTHIAZIDE 25 MG PO TABS
25.0000 mg | ORAL_TABLET | Freq: Every day | ORAL | Status: DC
  Administered 2014-10-27: 25 mg via ORAL
  Filled 2014-10-26: qty 1

## 2014-10-26 MED ORDER — ONDANSETRON HCL 4 MG PO TABS: 8.0000 mg | ORAL_TABLET | Freq: Three times a day (TID) | ORAL | Status: DC | PRN

## 2014-10-26 MED ORDER — BACLOFEN 10 MG PO TABS: 10.0000 mg | ORAL_TABLET | Freq: Three times a day (TID) | ORAL | Status: DC | PRN

## 2014-10-26 MED ORDER — ALBUTEROL SULFATE (2.5 MG/3ML) 0.083% IN NEBU
2.5000 mg | INHALATION_SOLUTION | Freq: Four times a day (QID) | RESPIRATORY_TRACT | Status: DC | PRN
Start: 2014-10-26 — End: 2014-10-27

## 2014-10-26 MED ORDER — PRAZOSIN HCL 5 MG PO CAPS
5.0000 mg | ORAL_CAPSULE | Freq: Every day | ORAL | Status: DC
  Administered 2014-10-26: 5 mg via ORAL
  Filled 2014-10-26 (×2): qty 1

## 2014-10-26 MED ORDER — NITROGLYCERIN 0.4 MG SL SUBL
0.4000 mg | SUBLINGUAL_TABLET | SUBLINGUAL | Status: AC | PRN
  Administered 2014-10-26 (×3): 0.4 mg via SUBLINGUAL
  Filled 2014-10-26: qty 1

## 2014-10-26 MED ORDER — GABAPENTIN 600 MG PO TABS
600.0000 mg | ORAL_TABLET | Freq: Three times a day (TID) | ORAL | Status: DC
  Administered 2014-10-26 – 2014-10-27 (×3): 600 mg via ORAL
  Filled 2014-10-26 (×3): qty 1

## 2014-10-26 MED ORDER — POTASSIUM CHLORIDE CRYS ER 20 MEQ PO TBCR
30.0000 meq | EXTENDED_RELEASE_TABLET | Freq: Once | ORAL | Status: AC
  Administered 2014-10-26: 30 meq via ORAL
  Filled 2014-10-26 (×2): qty 1

## 2014-10-26 MED ORDER — CALCIUM 1200 1200-1000 MG-UNIT PO CHEW: CHEWABLE_TABLET | Freq: Every day | ORAL | Status: DC

## 2014-10-26 MED ORDER — TRAZODONE HCL 100 MG PO TABS
100.0000 mg | ORAL_TABLET | Freq: Every day | ORAL | Status: DC
  Administered 2014-10-26: 100 mg via ORAL
  Filled 2014-10-26 (×2): qty 1

## 2014-10-26 MED ORDER — FLUTICASONE PROPIONATE 50 MCG/ACT NA SUSP
1.0000 | Freq: Every day | NASAL | Status: DC
  Filled 2014-10-26: qty 16

## 2014-10-26 MED ORDER — CALCIUM CARBONATE-VITAMIN D 500-200 MG-UNIT PO TABS
1.0000 | ORAL_TABLET | Freq: Every day | ORAL | Status: DC
  Administered 2014-10-27: 1 via ORAL
  Filled 2014-10-26: qty 1

## 2014-10-26 MED ORDER — ALBUTEROL SULFATE HFA 108 (90 BASE) MCG/ACT IN AERS: 2.0000 | INHALATION_SPRAY | Freq: Four times a day (QID) | RESPIRATORY_TRACT | Status: DC | PRN

## 2014-10-26 MED ORDER — SODIUM CHLORIDE 0.9 % IJ SOLN
3.0000 mL | Freq: Two times a day (BID) | INTRAMUSCULAR | Status: DC
  Administered 2014-10-26: 3 mL via INTRAVENOUS

## 2014-10-26 MED ORDER — SACCHAROMYCES BOULARDII 250 MG PO CAPS
250.0000 mg | ORAL_CAPSULE | Freq: Every day | ORAL | Status: DC
  Administered 2014-10-26: 250 mg via ORAL
  Filled 2014-10-26: qty 1

## 2014-10-26 MED ORDER — SODIUM CHLORIDE 0.9 % IV SOLN: 250.0000 mL | INTRAVENOUS | Status: DC | PRN

## 2014-10-26 MED ORDER — MULTI-VITAMIN/MINERALS PO TABS: 1.0000 | ORAL_TABLET | Freq: Every day | ORAL | Status: DC

## 2014-10-26 MED ORDER — SODIUM CHLORIDE 0.9 % IJ SOLN
3.0000 mL | Freq: Two times a day (BID) | INTRAMUSCULAR | Status: DC
  Administered 2014-10-27: 3 mL via INTRAVENOUS

## 2014-10-26 MED ORDER — CITALOPRAM HYDROBROMIDE 20 MG PO TABS
40.0000 mg | ORAL_TABLET | Freq: Every day | ORAL | Status: DC
Start: 2014-10-26 — End: 2014-10-27
  Administered 2014-10-26: 40 mg via ORAL
  Filled 2014-10-26: qty 2

## 2014-10-26 MED ORDER — ADULT MULTIVITAMIN W/MINERALS CH
1.0000 | ORAL_TABLET | Freq: Every day | ORAL | Status: DC
  Administered 2014-10-27: 1 via ORAL
  Filled 2014-10-26: qty 1

## 2014-10-26 MED ORDER — RESTORA PO CAPS: ORAL_CAPSULE | Freq: Every day | ORAL | Status: DC

## 2014-10-26 MED ORDER — BACITRACIN-NEOMYCIN-POLYMYXIN 400-5-5000 EX OINT
1.0000 "application " | TOPICAL_OINTMENT | Freq: Every day | CUTANEOUS | Status: DC
  Filled 2014-10-26: qty 1

## 2014-10-26 MED ORDER — ENOXAPARIN SODIUM 40 MG/0.4ML ~~LOC~~ SOLN: 40.0000 mg | SUBCUTANEOUS | Status: DC

## 2014-10-26 MED ORDER — HYDROCODONE-ACETAMINOPHEN 10-325 MG PO TABS
1.0000 | ORAL_TABLET | Freq: Four times a day (QID) | ORAL | Status: DC | PRN
  Administered 2014-10-26 – 2014-10-27 (×2): 1 via ORAL
  Filled 2014-10-26 (×2): qty 1

## 2014-10-26 MED ORDER — DOCUSATE SODIUM 100 MG PO CAPS: 100.0000 mg | ORAL_CAPSULE | Freq: Every day | ORAL | Status: DC | PRN

## 2014-10-26 MED ORDER — SODIUM CHLORIDE 0.9 % IJ SOLN: 3.0000 mL | INTRAMUSCULAR | Status: DC | PRN

## 2014-10-26 MED ORDER — NITROGLYCERIN 0.4 MG SL SUBL
0.4000 mg | SUBLINGUAL_TABLET | SUBLINGUAL | Status: DC | PRN
  Administered 2014-10-26: 0.4 mg via SUBLINGUAL
  Filled 2014-10-26: qty 1

## 2014-10-26 MED ORDER — HYDROMORPHONE HCL 1 MG/ML IJ SOLN: 0.5000 mg | INTRAMUSCULAR | Status: DC | PRN

## 2014-10-26 MED ORDER — ASPIRIN EC 325 MG PO TBEC
325.0000 mg | DELAYED_RELEASE_TABLET | Freq: Every day | ORAL | Status: DC
  Administered 2014-10-27: 325 mg via ORAL
  Filled 2014-10-26: qty 1

## 2014-10-26 MED ORDER — HALOPERIDOL 5 MG PO TABS
5.0000 mg | ORAL_TABLET | Freq: Two times a day (BID) | ORAL | Status: DC
  Administered 2014-10-26 – 2014-10-27 (×2): 5 mg via ORAL
  Filled 2014-10-26 (×3): qty 1

## 2014-10-26 MED ORDER — LUBIPROSTONE 24 MCG PO CAPS
24.0000 ug | ORAL_CAPSULE | Freq: Two times a day (BID) | ORAL | Status: DC
  Administered 2014-10-27 (×2): 24 ug via ORAL
  Filled 2014-10-26 (×3): qty 1

## 2014-10-26 MED ORDER — IOHEXOL 350 MG/ML SOLN
75.0000 mL | Freq: Once | INTRAVENOUS | Status: AC | PRN
  Administered 2014-10-26: 75 mL via INTRAVENOUS

## 2014-10-26 MED ORDER — PANTOPRAZOLE SODIUM 40 MG PO TBEC
40.0000 mg | DELAYED_RELEASE_TABLET | Freq: Two times a day (BID) | ORAL | Status: DC
  Administered 2014-10-26 – 2014-10-27 (×2): 40 mg via ORAL
  Filled 2014-10-26 (×2): qty 1

## 2014-10-26 MED ORDER — ENOXAPARIN SODIUM 60 MG/0.6ML ~~LOC~~ SOLN
50.0000 mg | SUBCUTANEOUS | Status: DC
  Administered 2014-10-26: 50 mg via SUBCUTANEOUS
  Filled 2014-10-26: qty 0.6

## 2014-10-26 MED ORDER — BENZTROPINE MESYLATE 1 MG PO TABS
1.0000 mg | ORAL_TABLET | Freq: Two times a day (BID) | ORAL | Status: DC
  Administered 2014-10-26 – 2014-10-27 (×2): 1 mg via ORAL
  Filled 2014-10-26 (×3): qty 1

## 2014-10-26 NOTE — ED Notes (Signed)
Per EMS, pt coming in for epigastric pain which radiates to her left chest and left shoulder. The pain started around 930. The pt has had this happen before but it improved on its own. Pt received 324 of aspirin and 1 nitro in route which brought the pain from a 7 to a 4 out of 10. Pt alert x4. NAD at this time.

## 2014-10-26 NOTE — ED Notes (Signed)
Pt placed on 2L [place] due to SpO2 being 88%. MD notified. SpO2 improved to 98%.

## 2014-10-26 NOTE — ED Provider Notes (Signed)
CSN: [id number]     Arrival date & time 10/26/14  1135 History   First MD Initiated Contact with Patient 10/26/14 1143     Chief Complaint  Patient presents with  . Chest Pain     (Consider location/radiation/quality/duration/timing/severity/associated sxs/prior Treatment) HPI Comments: Patient presents by EMS after episode of central chest pain that onset while she was working in the kitchen. She reports central chest pressure reading to her left chest, left shoulder and left neck. This started around 9:30 AM and has been constant. It is somewhat improved with nitroglycerin she received in route. She denies any shortness of breath, nausea, vomiting, diaphoresis. History of similar episodes in the past but she has never had them evaluated. Never had a stress test. She reports somewhat improved after the nitroglycerin. She continues to have chest pressure radiating to the left side of her chest. No abdominal pain, nausea or vomiting. Patient with history of hypertension, anxiety, bipolar disorder and acid reflux. This does not feel similar to her acid reflux.  The history is provided by the patient.    Past Medical History  Diagnosis Date  . Hypertension   . Asthma   . Sleep apnea   . Anxiety   . Depression   . Bipolar 1 disorder     ect treatments last treatment Sep 02 1011  . GERD (gastroesophageal reflux disease)    Past Surgical History  Procedure Laterality Date  . Abdominal hysterectomy    . Back surgery    . Laproscopic knee surgery     Family History  Problem Relation Age of Onset  . Diabetes Mother   . COPD Mother   . Hypertension Mother   . Hyperlipidemia Mother   . Heart disease Father   . Hyperlipidemia Father   . Hypertension Father   . Heart disease Brother   . Heart disease Daughter    History  Substance Use Topics  . Smoking status: Never Smoker   . Smokeless tobacco: Not on file  . Alcohol Use: No   OB History    No data available     Review of  Systems  Constitutional: Negative for activity change and appetite change.  HENT: Negative for congestion and rhinorrhea.   Respiratory: Negative for cough and shortness of breath.   Cardiovascular: Positive for chest pain.  Gastrointestinal: Negative for nausea, vomiting and abdominal pain.  Genitourinary: Negative for dysuria, hematuria, vaginal bleeding and vaginal discharge.  Musculoskeletal: Negative for myalgias and arthralgias.  Skin: Negative for pallor.  Neurological: Negative for dizziness, weakness and headaches.  A complete 10 system review of systems was obtained and all systems are negative except as noted in the HPI and PMH.      Allergies  Tetracyclines & related; Tramadol; Ciprofloxacin; Codeine; Morphine and related; and Penicillins  Home Medications   Prior to Admission medications   Medication Sig Start Date End Date Taking? Authorizing Provider  baclofen (LIORESAL) 10 MG tablet Take 10 mg by mouth 3 (three) times daily as needed. 08/19/14  Yes Historical Provider, MD  benztropine (COGENTIN) 1 MG tablet Take 1 mg by mouth 2 (two) times daily.    Yes Historical Provider, MD  Calcium Carbonate-Vit D-Min (CALCIUM 1200 PO) Take 1 tablet by mouth daily.   Yes Historical Provider, MD  citalopram (CELEXA) 40 MG tablet Take 40 mg by mouth at bedtime. For depresion. 05/28/11  Yes Darrol Jump, MD  fluticasone (FLONASE) 50 MCG/ACT nasal spray PLACE 2 SPRAYS INTO BOTH NOSTRILS  DAILY. 03/01/14  Yes [name], MD  gabapentin (NEURONTIN) 600 MG tablet Take 1 tablet (600 mg total) by mouth 3 (three) times daily. For anxiety and pain management. May have to stop to take ECT. 05/28/11  Yes Darrol Jump, MD  haloperidol (HALDOL) 5 MG tablet Take 1 tablet (5 mg total) by mouth 2 (two) times daily. For psychosis 05/28/11  Yes Darrol Jump, MD  hydrochlorothiazide (HYDRODIURIL) 25 MG tablet Take 1 tablet (25 mg total) by mouth daily. 10/07/14  Yes [name], MD   HYDROcodone-acetaminophen (NORCO) 10-325 MG per tablet Take 1 tablet by mouth every 6 (six) hours as needed.   Yes Historical Provider, MD  lubiprostone (AMITIZA) 24 MCG capsule TAKE 1 CAPSULE TWICE A DAY WITH A MEAL Patient taking differently: Take 24 mcg by mouth 2 (two) times daily with a meal.  08/01/14  Yes [name], MD  Multiple Vitamins-Minerals (MULTIVITAMIN WITH MINERALS) tablet Take 1 tablet by mouth daily.   Yes Historical Provider, MD  neomycin-bacitracin-polymyxin (NEOSPORIN) ointment Apply 1 application topically daily. apply to eye   Yes Historical Provider, MD  ondansetron (ZOFRAN) 8 MG tablet Take 8 mg by mouth every 8 (eight) hours as needed. for nausea 09/11/14  Yes Historical Provider, MD  pantoprazole (PROTONIX) 40 MG tablet Take 1 tablet (40 mg total) by mouth 2 (two) times daily. 08/01/14  Yes [name], MD  prazosin (MINIPRESS) 5 MG capsule Take 5 mg by mouth at bedtime.   Yes Historical Provider, MD  PROAIR HFA 108 (90 BASE) MCG/ACT inhaler INHALE 2 PUFFS INTO THE LUNGS EVERY 4 HOURS AS NEEDED FOR WHEEZING 10/01/14  Yes [name], MD  Probiotic Product (RESTORA PO) Take 1 capsule by mouth at bedtime.    Yes Historical Provider, MD  SUMAtriptan (IMITREX) 100 MG tablet TAKE 1 TABLET (100 MG TOTAL) BY MOUTH EVERY 2 (TWO) HOURS AS NEEDED. MIGRANES 05/27/14  Yes [name], MD  traZODone (DESYREL) 100 MG tablet Take 100 mg by mouth at bedtime. 07/29/14  Yes Historical Provider, MD  VOLTAREN 1 % GEL Apply topically daily. 06/29/13  Yes Historical Provider, MD  docusate sodium (COLACE) 100 MG capsule Take 100 mg by mouth daily.    Historical Provider, MD  methocarbamol (ROBAXIN) 750 MG tablet Take 750 mg by mouth 4 (four) times daily.    Historical Provider, MD  neomycin-polymyxin-hydrocortisone (CORTISPORIN) 3.5-10000-1 otic suspension Place 4 drops into the left ear 3 (three) times daily. Patient not taking: Reported on 10/26/2014 04/18/14   Harden Mo, MD   nystatin (MYCOSTATIN) 100000 UNIT/ML suspension Take 5 mLs by mouth 4 (four) times daily. Swish and swallow 10/12/14   Historical Provider, MD  promethazine (PHENERGAN) 25 MG tablet Take 1 tablet (25 mg total) by mouth every 8 (eight) hours as needed for nausea or vomiting. Patient not taking: Reported on 10/26/2014 07/05/13   Orlena Sheldon, PA-C  sulfamethoxazole-trimethoprim (BACTRIM DS,SEPTRA DS) 800-160 MG per tablet Take 1 tablet by mouth 2 (two) times daily. Patient not taking: Reported on 10/26/2014 10/04/14   [name], MD   BP 112/68 mmHg  Pulse 66  Temp(Src) 98.6 F (37 C) (Oral)  Resp 17  SpO2 97% Physical Exam  Constitutional: She is oriented to person, place, and time. She appears well-developed and well-nourished. No distress.  HENT:  Head: Normocephalic and atraumatic.  Mouth/Throat: Oropharynx is clear and moist. No oropharyngeal exudate.  Eyes: Conjunctivae and EOM are normal. Pupils are  equal, round, and reactive to light.  Neck: Normal range of motion. Neck supple.  No meningismus.  Cardiovascular: Normal rate, regular rhythm, normal heart sounds and intact distal pulses.   No murmur heard. Pulmonary/Chest: Effort normal and breath sounds normal. No respiratory distress. She exhibits no tenderness.  Abdominal: Soft. There is no tenderness. There is no rebound and no guarding.  Musculoskeletal: Normal range of motion. She exhibits no edema or tenderness.  Neurological: She is alert and oriented to person, place, and time. No cranial nerve deficit. She exhibits normal muscle tone. Coordination normal.  No ataxia on finger to nose bilaterally. No pronator drift. 5/5 strength throughout. CN 2-12 intact. Negative Romberg. Equal grip strength. Sensation intact. Gait is normal.   Skin: Skin is warm.  Psychiatric: She has a normal mood and affect. Her behavior is normal.  Nursing note and vitals reviewed.   ED Course  Procedures (including critical care time) Labs  Review Labs Reviewed  CBC WITH DIFFERENTIAL/PLATELET - Abnormal; Notable for the following:    RBC 3.83 (*)    All other components within normal limits  BASIC METABOLIC PANEL - Abnormal; Notable for the following:    Potassium 3.2 (*)    Glucose, Bld 120 (*)    All other components within normal limits  D-DIMER, QUANTITATIVE (NOT AT [facility]) - Abnormal; Notable for the following:    D-Dimer, Quant 1.51 (*)    All other components within normal limits  TROPONIN I    Imaging Review Dg Chest 2 View  10/26/2014   CLINICAL DATA:  Acute left chest pain and jaw pain today.  EXAM: CHEST  2 VIEW  COMPARISON:  03/23/2012 and prior radiographs.  12/12/2013 CT  FINDINGS: The cardiomediastinal silhouette is unremarkable.  Subsegmental left basilar atelectasis is identified.  There is no evidence of focal airspace disease, pulmonary edema, suspicious pulmonary nodule/mass, pleural effusion, or pneumothorax. No acute bony abnormalities are identified.  Thoracolumbar surgical hardware again noted.  IMPRESSION: Minimal left basilar atelectasis without other significant abnormality.   Electronically Signed   By: Margarette Canada M.D.   On: 10/26/2014 13:37   Ct Angio Chest Pe W/cm &/or Wo Cm  10/26/2014   CLINICAL DATA:  Left chest pain radiating to left shoulder, shortness of Breath  EXAM: CT ANGIOGRAPHY CHEST WITH CONTRAST  TECHNIQUE: Multidetector CT imaging of the chest was performed using the standard protocol during bolus administration of intravenous contrast. Multiplanar CT image reconstructions and MIPs were obtained to evaluate the vascular anatomy.  CONTRAST:  55mL OMNIPAQUE IOHEXOL 350 MG/ML SOLN  COMPARISON:  12/12/2013  FINDINGS: Left arm contrast injection. SVC patent. The right ventricle is nondilated. Satisfactory opacification of pulmonary arteries noted, and there is no evidence of pulmonary emboli. Patent bilateral pulmonary veins. Fair contrast opacification of the thoracic aorta, without evidence of  dissection or stenosis. There is 3 vessel brachiocephalic arterial origin anatomy without proximal stenosis. No significant atheromatous plaque.  Tiny bilateral pleural effusions. No pericardial effusion. No pneumothorax. No hilar or mediastinal adenopathy. Subsegmental atelectasis posteriorly in both lungs, left greater than right. No focal consolidation or nodule. Minimal spurring in the mid thoracic spine. Lower thoracic spinal fixation hardware is noted, incompletely visualized distally. Sternum intact. Mild elevation of the left diaphragmatic leaflet. Visualized portions of upper abdomen unremarkable.  Review of the MIP images confirms the above findings.  IMPRESSION: 1. Negative for acute PE or thoracic aortic dissection.   Electronically Signed   By: Lucrezia Europe M.D.   On:  10/26/2014 14:16     EKG Interpretation   Date/Time:  Saturday October 26 2014 12:17:13 EDT Ventricular Rate:  68 PR Interval:  177 QRS Duration: 103 QT Interval:  446 QTC Calculation: 474 R Axis:   51 Text Interpretation:  Sinus rhythm Low voltage, precordial leads less  pronounced ST depressions Confirmed by Wyvonnia Dusky  MD, Zeriah Baysinger ([id number]) on  10/26/2014 12:39:17 PM      MDM   Final diagnoses:  Chest pain, unspecified chest pain type   Chest pressure radiated into the left side of chest, arm and neck. Improved with nitroglycerin. EKG with subtle ST depressions inferiorly.  Chest pain resolved after 2 nitroglycerin's. CT negative for PE.  Patient with heart score 4. Brother at age 90 and had multiple MIs. Patient prefers inpatient workup for her stress test. Discussed with cardiology Dr. Mare Ferrari who request medical admission. D/w [facility] residents   Ezequiel Essex, MD 10/26/14 [phone]

## 2014-10-26 NOTE — ED Notes (Signed)
Patient transported to CT 

## 2014-10-26 NOTE — Progress Notes (Signed)
Pt C/O chest pain 4/10 at 1842 relieved by NTG sublingual x 2. BP stable.

## 2014-10-26 NOTE — ED Notes (Signed)
Patient returned from X-ray 

## 2014-10-26 NOTE — Consult Note (Signed)
Admit date: 10/26/2014 Referring Physician  Dr. Wyvonnia Dusky Primary Physician Jenna Luo TOM, MD Primary Cardiologist  none Reason for Consultation  Chest pain  HPI: 49 year old with episode of centralized chest discomfort, left chest, left shoulder, left neck that started at approximately 9:30 AM while working in the kitchen and remained fairly constant and worrisome to her. No significant nausea, shortness of breath. In the past, she has had similar episodes. Denies any prior stress test. I do see in her Epic history a previous admission/discharge from Dr. Peter Martinique in 2002. Unknown procedures at that time. She has a history of anxiety, bipolar disorder, acid reflux, previous behavioral health admission. She thought that this felt different from her acid reflux. She also has obesity as well. Brother with MI age 4.  Last echocardiogram was on 09/23/2009 and ejection fraction was between 55 and 65% with mild to moderate tricuspid regurgitation, mild elevated pulmonary artery pressure 39 mmHg. She also had mild aortic valve regurgitation.  She has had peripheral neuropathy as well. Has seen Dr. Dennard Schaumann at [facility] as recently as 10/07/14. She has had vertigo as well.   PMH:   Past Medical History  Diagnosis Date  . Hypertension   . Asthma   . Sleep apnea   . Anxiety   . Depression   . Bipolar 1 disorder     ect treatments last treatment Sep 02 1011  . GERD (gastroesophageal reflux disease)     PSH:   Past Surgical History  Procedure Laterality Date  . Abdominal hysterectomy    . Back surgery    . Laproscopic knee surgery     Allergies:  Tetracyclines & related; Tramadol; Ciprofloxacin; Codeine; Morphine and related; and Penicillins Prior to Admit Meds:   Prior to Admission medications   Medication Sig Start Date End Date Taking? Authorizing Provider  benztropine (COGENTIN) 1 MG tablet Take 1 mg by mouth daily.    Historical Provider, MD  Calcium  Carbonate-Vit D-Min (CALCIUM 1200 PO) Take 1 tablet by mouth daily.    Historical Provider, MD  citalopram (CELEXA) 40 MG tablet Take 40 mg by mouth at bedtime. For depresion. 05/28/11   Darrol Jump, MD  docusate sodium (COLACE) 100 MG capsule Take 100 mg by mouth daily.    Historical Provider, MD  fluticasone (FLONASE) 50 MCG/ACT nasal spray PLACE 2 SPRAYS INTO BOTH NOSTRILS DAILY. 03/01/14   [name], MD  gabapentin (NEURONTIN) 600 MG tablet Take 1 tablet (600 mg total) by mouth 3 (three) times daily. For anxiety and pain management. May have to stop to take ECT. 05/28/11   Darrol Jump, MD  haloperidol (HALDOL) 5 MG tablet Take 1 tablet (5 mg total) by mouth 2 (two) times daily. For psychosis 05/28/11   Darrol Jump, MD  hydrochlorothiazide (HYDRODIURIL) 25 MG tablet Take 1 tablet (25 mg total) by mouth daily. 10/07/14   [name], MD  HYDROcodone-acetaminophen (NORCO) 10-325 MG per tablet Take 1 tablet by mouth every 6 (six) hours as needed.    Historical Provider, MD  lubiprostone (AMITIZA) 24 MCG capsule TAKE 1 CAPSULE TWICE A DAY WITH A MEAL 08/01/14   [name], MD  methocarbamol (ROBAXIN) 750 MG tablet Take 750 mg by mouth 4 (four) times daily.    Historical Provider, MD  neomycin-polymyxin-hydrocortisone (CORTISPORIN) 3.5-10000-1 otic suspension Place 4 drops into the left ear 3 (three) times daily. 04/18/14   Harden Mo, MD  pantoprazole (PROTONIX) 40 MG  tablet Take 1 tablet (40 mg total) by mouth 2 (two) times daily. 08/01/14   [name], MD  prazosin (MINIPRESS) 5 MG capsule Take 5 mg by mouth at bedtime.    Historical Provider, MD  PROAIR HFA 108 (90 BASE) MCG/ACT inhaler INHALE 2 PUFFS INTO THE LUNGS EVERY 4 HOURS AS NEEDED FOR WHEEZING 10/01/14   [name], MD  Probiotic Product (RESTORA PO) Take 1 capsule by mouth daily.    Historical Provider, MD  promethazine (PHENERGAN) 25 MG tablet Take 1 tablet (25 mg total) by mouth every 8 (eight) hours as  needed for nausea or vomiting. 07/05/13   Lonie Peak Dixon, PA-C  sulfamethoxazole-trimethoprim (BACTRIM DS,SEPTRA DS) 800-160 MG per tablet Take 1 tablet by mouth 2 (two) times daily. 10/04/14   [name], MD  SUMAtriptan (IMITREX) 100 MG tablet TAKE 1 TABLET (100 MG TOTAL) BY MOUTH EVERY 2 (TWO) HOURS AS NEEDED. MIGRANES 05/27/14   [name], MD  VOLTAREN 1 % GEL Apply topically daily. 06/29/13   Historical Provider, MD   Fam HX:    Family History  Problem Relation Age of Onset  . Diabetes Mother   . COPD Mother   . Hypertension Mother   . Hyperlipidemia Mother   . Heart disease Father   . Hyperlipidemia Father   . Hypertension Father   . Heart disease Brother   . Heart disease Daughter    Social HX:    History   Social History  . Marital Status: Legally Separated    Spouse Name: N/A  . Number of Children: N/A  . Years of Education: N/A   Occupational History  . Not on file.   Social History Main Topics  . Smoking status: Never Smoker   . Smokeless tobacco: Not on file  . Alcohol Use: No  . Drug Use: No  . Sexual Activity: No   Other Topics Concern  . Not on file   Social History Narrative     ROS:  Denies any syncope, bleeding, orthopnea, PND All 11 ROS were addressed and are negative except what is stated in the HPI   Physical Exam: Blood pressure 112/68, pulse 66, temperature 98.6 F (37 C), temperature source Oral, resp. rate 17, SpO2 97 %.   General: Well developed, well nourished, in no acute distress Head: Eyes PERRLA, No xanthomas.   Normal cephalic and atramatic  Lungs:   Clear bilaterally to auscultation and percussion. Normal respiratory effort. No wheezes, no rales. Heart:   HRRR S1 S2 Pulses are 2+ & equal. No murmur, rubs, gallops.  No carotid bruit. No JVD.  No abdominal bruits.  Abdomen: Bowel sounds are positive, abdomen soft and non-tender without masses. No hepatosplenomegaly. Msk:  Back normal. Normal strength and tone for  age. Extremities:  No clubbing, cyanosis or edema.  DP +1 Neuro: Alert and oriented X 3, non-focal, MAE x 4 GU: Deferred Rectal: Deferred Psych:  Good affect, responds appropriately      Labs: Lab Results  Component Value Date   WBC 5.9 10/26/2014   HGB 12.4 10/26/2014   HCT 37.3 10/26/2014   MCV 97.4 10/26/2014   PLT 264 10/26/2014     Recent Labs Lab 10/26/14 1205  NA 138  K 3.2*  CL 101  CO2 26  BUN 9  CREATININE 0.70  CALCIUM 8.9  GLUCOSE 120*    Recent Labs  10/26/14 1205  TROPONINI <0.03   Lab Results  Component Value Date  CHOL 205* 08/18/2012   HDL 61 08/18/2012   LDLCALC 124* 08/18/2012   TRIG 98 08/18/2012   Lab Results  Component Value Date   DDIMER 1.51* 10/26/2014     Radiology:  Dg Chest 2 View  10/26/2014   CLINICAL DATA:  Acute left chest pain and jaw pain today.  EXAM: CHEST  2 VIEW  COMPARISON:  03/23/2012 and prior radiographs.  12/12/2013 CT  FINDINGS: The cardiomediastinal silhouette is unremarkable.  Subsegmental left basilar atelectasis is identified.  There is no evidence of focal airspace disease, pulmonary edema, suspicious pulmonary nodule/mass, pleural effusion, or pneumothorax. No acute bony abnormalities are identified.  Thoracolumbar surgical hardware again noted.  IMPRESSION: Minimal left basilar atelectasis without other significant abnormality.   Electronically Signed   By: Margarette Canada M.D.   On: 10/26/2014 13:37   Ct Angio Chest Pe W/cm &/or Wo Cm  10/26/2014   CLINICAL DATA:  Left chest pain radiating to left shoulder, shortness of Breath  EXAM: CT ANGIOGRAPHY CHEST WITH CONTRAST  TECHNIQUE: Multidetector CT imaging of the chest was performed using the standard protocol during bolus administration of intravenous contrast. Multiplanar CT image reconstructions and MIPs were obtained to evaluate the vascular anatomy.  CONTRAST:  94mL OMNIPAQUE IOHEXOL 350 MG/ML SOLN  COMPARISON:  12/12/2013  FINDINGS: Left arm contrast injection.  SVC patent. The right ventricle is nondilated. Satisfactory opacification of pulmonary arteries noted, and there is no evidence of pulmonary emboli. Patent bilateral pulmonary veins. Fair contrast opacification of the thoracic aorta, without evidence of dissection or stenosis. There is 3 vessel brachiocephalic arterial origin anatomy without proximal stenosis. No significant atheromatous plaque.  Tiny bilateral pleural effusions. No pericardial effusion. No pneumothorax. No hilar or mediastinal adenopathy. Subsegmental atelectasis posteriorly in both lungs, left greater than right. No focal consolidation or nodule. Minimal spurring in the mid thoracic spine. Lower thoracic spinal fixation hardware is noted, incompletely visualized distally. Sternum intact. Mild elevation of the left diaphragmatic leaflet. Visualized portions of upper abdomen unremarkable.  Review of the MIP images confirms the above findings.  IMPRESSION: 1. Negative for acute PE or thoracic aortic dissection.   Electronically Signed   By: Lucrezia Europe M.D.   On: 10/26/2014 14:16   Personally viewed.  EKG:  10/26/14-sinus rhythm with no other abnormalities Personally viewed.   ASSESSMENT/PLAN:    49 year old female with chest pain  1. Chest pain - CT scan negative for pulmonary embolism. No evidence of coronary artery calcification, personally viewed. Troponin is normal. Quite anxious especially with her family history of CAD (Brother 58). Will set up for NUC stress tomorrow. NPO past midnight. She would rather complete this here in the hospital.   2. Obesity-encourage weight loss  3. Anxiety/depression/bipolar disorder/migraines-per primary  4. Hypokalemia-potassium 3.2-replete per primary team  Candee Furbish, MD  10/26/2014  2:56 PM

## 2014-10-26 NOTE — H&P (Signed)
[facility] Admission History and Physical Service Pager: [phone]  Patient name: [name] [name] Medical record number: [id number] Date of birth: 1965-08-27 Age: 49 y.o. Gender: female  Primary Care Provider: Odette Fraction, MD at [facility] Consultants: Cardiology Code Status: FULL  Chief Complaint: chest pain  Assessment and Plan: [name] [name] is a 49 y.o. female presenting with chest pain . PMH is significant for HTN, asthma, sleep apnea, depression, GERD, bipolar disorder and depression.   Chest pain: Atypical angina. Tight substernal pressure/tightness radiating to L arm and neck not relieved by rest. Nitro has been effective at pain relief. Troponin x1 <0.03. CTA ruled out PE or aortic dissection. CXR showed minimal left basilar atelectasis. EKG in ED showed inferior ST depressions. Repeat EKG showed less profound but still present depressions. Euvolemic. No recent echo.  -Telemetry -Cards consult -NPO at midnight -Nuclear stress test tomorrow -Repeat EKG in AM -Nitro 0.4mg  PRN chest pain -Dilaudid and O2 PRN (morphine allergy) -Hgb A1C (last normal in 2011)  -Trend troponin x2 -Repeat BMP in AM s/p dye load  Obstructive sleep apnea: Sleeps with wedge but not CPAP.  - Consider echo to evaluate pulm pressures  Chronic back pain:  -Continue home hydrocodone, baclofen  Meniere's Disease/Vertigo: followed by Dr. Dennard Schaumann  -Continue home HCTZ (denies this is tx for HTN)  Urinary retention: -Continue home prazosin   Constipation -Continue home Amitiza  GERD -Continue home Protonix   Constipation -Continue home Colace  Depression/Anxiety/Bipolar I disorder: followed by [facility], with history of ECT.  -Continue home Celexa, Cogentin, gabapentin  FEN/GI: Protonix, heart healthy diet  Prophylaxis: Lovenox  Disposition: admit to chest pain observation floor on telemetry for further evaluation  History of Present Illness:  [name] [name] is a 49 y.o. female presenting with chest pain.  [name] [name] reports that this morning around 9:30 AM, she began to experience chest pain and pressure while working in her kitchen. She said the pain radiated to her L arm and jaw. She described the pain as right pressure in the middle of her chest that was severe and constant. She sat down and took two Tums, which did not relieve the pain. She then called 911. EMS gave her one nitroglycerin tablet, which helped alleviate the pain. She received two more in the ED, after which her pain subsided. She took the last nitro three hours before I saw her, and her pain had returned, though not as severe as before. She reports that she has had similar but less intense symptoms in the past, but has never sought care. She denies SOB, palpitations, N/V/D, leg swelling. She endorses constipation.   She has chronic back pain, for which she takes 5 hydrocodone 10-325 per day. She has sleep apnea but does not use a CPAP. Instead she sleeps on a wedge with one pillow. She endorses PND, but says this has not increased in frequency recently. She was recently treated for a pressure ulcer on her foot with a course of Bactrim and has completed this.  Review Of Systems: Per HPI with the following additions: Denies abdominal pain, N/V/D, recent illness, SOB, cough, wheezing; endorses constipation. Otherwise 12 point review of systems was performed and was unremarkable.  Patient Active Problem List   Diagnosis Date Noted  . Chest pain 10/26/2014  . Obesity 10/26/2014  . Hypokalemia 10/26/2014  . Manic bipolar I disorder 10/26/2014  . Manic bipolar I disorder with rapid cycling 05/18/2011    Class: Acute  Past Medical History: Past Medical History  Diagnosis Date  . Hypertension   . Asthma   . Sleep apnea   . Anxiety   . Depression   . Bipolar 1 disorder     ect treatments last treatment Sep 02 1011  . GERD (gastroesophageal reflux disease)     Past Surgical History: Past Surgical History  Procedure Laterality Date  . Abdominal hysterectomy    . Back surgery    . Laproscopic knee surgery     Social History: History  Substance Use Topics  . Smoking status: Never Smoker   . Smokeless tobacco: Not on file  . Alcohol Use: No   Additional social history: former crack cocaine user. Follow[facility] by [facility] for [facility] care.  Please also refer to relevant sections of EMR.  Family History: Family History  Problem Relation Age of Onset  . Diabetes Mother   . COPD Mother   . Hypertension Mother   . Hyperlipidemia Mother   . Heart disease Father   . Hyperlipidemia Father   . Hypertension Father   . Heart disease Brother   . Heart disease Daughter    Allergies and Medications: Allergies  Allergen Reactions  . Tetracyclines & Related Other (See Comments)    syncope  . Tramadol Other (See Comments)    seizures  . Ciprofloxacin Rash  . Codeine Itching  . Morphine And Related Other (See Comments)    Memory loss  . Penicillins Hives and Rash   No current facility-administered medications on file prior to encounter.   Current Outpatient Prescriptions on File Prior to Encounter  Medication Sig Dispense Refill  . benztropine (COGENTIN) 1 MG tablet Take 1 mg by mouth 2 (two) times daily.     . Calcium Carbonate-Vit D-Min (CALCIUM 1200 PO) Take 1 tablet by mouth daily.    . citalopram (CELEXA) 40 MG tablet Take 40 mg by mouth at bedtime. For depresion.    . fluticasone (FLONASE) 50 MCG/ACT nasal spray PLACE 2 SPRAYS INTO BOTH NOSTRILS DAILY. 48 g 2  . gabapentin (NEURONTIN) 600 MG tablet Take 1 tablet (600 mg total) by mouth 3 (three) times daily. For anxiety and pain management. May have to stop to take ECT. 90 tablet 0  . haloperidol (HALDOL) 5 MG tablet Take 1 tablet (5 mg total) by mouth 2 (two) times daily. For psychosis 60 tablet 0  . hydrochlorothiazide (HYDRODIURIL) 25 MG tablet Take 1 tablet (25 mg total) by mouth  daily. 30 tablet 3  . HYDROcodone-acetaminophen (NORCO) 10-325 MG per tablet Take 1 tablet by mouth every 6 (six) hours as needed.    . lubiprostone (AMITIZA) 24 MCG capsule TAKE 1 CAPSULE TWICE A DAY WITH A MEAL (Patient taking differently: Take 24 mcg by mouth 2 (two) times daily with a meal. ) 180 capsule 2  . pantoprazole (PROTONIX) 40 MG tablet Take 1 tablet (40 mg total) by mouth 2 (two) times daily. 180 tablet 2  . prazosin (MINIPRESS) 5 MG capsule Take 5 mg by mouth at bedtime.    Marland Kitchen PROAIR HFA 108 (90 BASE) MCG/ACT inhaler INHALE 2 PUFFS INTO THE LUNGS EVERY 4 HOURS AS NEEDED FOR WHEEZING 8.5 Inhaler 3  . Probiotic Product (RESTORA PO) Take 1 capsule by mouth at bedtime.     . SUMAtriptan (IMITREX) 100 MG tablet TAKE 1 TABLET (100 MG TOTAL) BY MOUTH EVERY 2 (TWO) HOURS AS NEEDED. MIGRANES 10 tablet 2  . VOLTAREN 1 % GEL Apply topically daily.    Marland Kitchen  docusate sodium (COLACE) 100 MG capsule Take 100 mg by mouth daily.    . methocarbamol (ROBAXIN) 750 MG tablet Take 750 mg by mouth 4 (four) times daily.    Marland Kitchen neomycin-polymyxin-hydrocortisone (CORTISPORIN) 3.5-10000-1 otic suspension Place 4 drops into the left ear 3 (three) times daily. (Patient not taking: Reported on 10/26/2014) 10 mL 0  . promethazine (PHENERGAN) 25 MG tablet Take 1 tablet (25 mg total) by mouth every 8 (eight) hours as needed for nausea or vomiting. (Patient not taking: Reported on 10/26/2014) 30 tablet 0  . sulfamethoxazole-trimethoprim (BACTRIM DS,SEPTRA DS) 800-160 MG per tablet Take 1 tablet by mouth 2 (two) times daily. (Patient not taking: Reported on 10/26/2014) 20 tablet 0  . [DISCONTINUED] lithium carbonate (LITHOBID) 300 MG CR tablet Take 1 tablet (300 mg total) by mouth every 12 (twelve) hours. For mood swings and depression 60 tablet 0    Objective: BP 112/68 mmHg  Pulse 66  Temp(Src) 98.6 F (37 C) (Oral)  Resp 17  SpO2 97% Exam: General: NAD, lying in bed Cardiovascular: RRR, no murmurs apprecated, no JVD, no  LE edema, no carotid bruits. 2+ radial, and DP pulses symmetrically.  Respiratory: CTAB, no wheezes Abdomen: soft, non-tender, non-distended, +BS MSK: Moving all extremities spontaneously; wearing protective footwear on R foot due to healing pressure ulcer  Skin: Scars from prior surgery on back Neuro: A&Ox3, no gross neuro deficits Psych: normal mood and affect  Labs and Imaging: CBC BMET   Recent Labs Lab 10/26/14 1205  WBC 5.9  HGB 12.4  HCT 37.3  PLT 264    Recent Labs Lab 10/26/14 1205  NA 138  K 3.2*  CL 101  CO2 26  BUN 9  CREATININE 0.70  GLUCOSE 120*  CALCIUM 8.9      Verner Mould, MD 10/26/2014, 4:36 PM PGY-1, [place] pager: [phone], text pages welcome  I have seen and evaluated the patient with Dr. Avon Gully. I am in agreement with the note above in its revised form. My additions are in red.  Brentyn Seehafer B. Bonner Puna, MD, PGY-3  10/26/2014 5:52 PM

## 2014-10-26 NOTE — ED Notes (Signed)
Patient transported to X-ray 

## 2014-10-27 ENCOUNTER — Observation Stay (HOSPITAL_COMMUNITY): Payer: Medicare Other

## 2014-10-27 DIAGNOSIS — I10 Essential (primary) hypertension: Secondary | ICD-10-CM | POA: Diagnosis not present

## 2014-10-27 DIAGNOSIS — E876 Hypokalemia: Secondary | ICD-10-CM | POA: Diagnosis not present

## 2014-10-27 DIAGNOSIS — R079 Chest pain, unspecified: Secondary | ICD-10-CM | POA: Diagnosis not present

## 2014-10-27 DIAGNOSIS — I208 Other forms of angina pectoris: Secondary | ICD-10-CM

## 2014-10-27 DIAGNOSIS — F3110 Bipolar disorder, current episode manic without psychotic features, unspecified: Secondary | ICD-10-CM | POA: Diagnosis not present

## 2014-10-27 DIAGNOSIS — G4700 Insomnia, unspecified: Secondary | ICD-10-CM | POA: Diagnosis not present

## 2014-10-27 DIAGNOSIS — J45909 Unspecified asthma, uncomplicated: Secondary | ICD-10-CM | POA: Diagnosis not present

## 2014-10-27 LAB — BASIC METABOLIC PANEL
Anion gap: 8 (ref 5–15)
BUN: <5 mg/dL — ABNORMAL LOW (ref 6–20)
CO2: 30 mmol/L (ref 22–32)
Calcium: 9.3 mg/dL (ref 8.9–10.3)
Chloride: 102 mmol/L (ref 101–111)
Creatinine, Ser: 0.64 mg/dL (ref 0.44–1.00)
GFR calc Af Amer: >60 mL/min (ref >60)
GFR calc non Af Amer: >60 mL/min (ref >60)
Glucose, Bld: 102 mg/dL — ABNORMAL HIGH (ref 65–99)
Potassium: 4 mmol/L (ref 3.5–5.1)
Sodium: 140 mmol/L (ref 135–145)

## 2014-10-27 LAB — TROPONIN I: Troponin I: <0.03 ng/mL (ref <0.031)

## 2014-10-27 MED ORDER — REGADENOSON 0.4 MG/5ML IV SOLN
INTRAVENOUS | Status: AC
  Administered 2014-10-27: 0.4 mg via INTRAVENOUS
  Filled 2014-10-27: qty 5

## 2014-10-27 MED ORDER — TECHNETIUM TC 99M SESTAMIBI - CARDIOLITE: 30.0000 | Freq: Once | INTRAVENOUS | Status: DC | PRN

## 2014-10-27 MED ORDER — TECHNETIUM TC 99M SESTAMIBI GENERIC - CARDIOLITE
10.0000 | Freq: Once | INTRAVENOUS | Status: AC | PRN
  Administered 2014-10-27: 10 via INTRAVENOUS

## 2014-10-27 MED ORDER — NITROGLYCERIN 0.4 MG SL SUBL: 0.4000 mg | SUBLINGUAL_TABLET | SUBLINGUAL | Status: DC | PRN

## 2014-10-27 MED ORDER — TECHNETIUM TC 99M SESTAMIBI GENERIC - CARDIOLITE
30.0000 | Freq: Once | INTRAVENOUS | Status: AC | PRN
  Administered 2014-10-27: 30 via INTRAVENOUS

## 2014-10-27 MED ORDER — REGADENOSON 0.4 MG/5ML IV SOLN
0.4000 mg | Freq: Once | INTRAVENOUS | Status: AC
  Administered 2014-10-27: 0.4 mg via INTRAVENOUS

## 2014-10-27 NOTE — Discharge Instructions (Signed)
You were admitted for chest pain. You were observed overnight to rule out your heart as the cause, and the stress test that was done today did not show any evidence that your heart has a problem. You also had a CT scan which did not show any blood clot in the lungs or pneumonia.  You will be given a prescription for nitro tablets that you can take if you need it to help with chest pains. Take the tablet under your tongue to let it dissolve. The major side effect from taking these is headache.

## 2014-10-27 NOTE — Discharge Summary (Signed)
[facility] Discharge Summary  Patient name: [name] [name] Medical record number: [id number] Date of birth: [birth year] Age: 49 y.o. Gender: female Date of Admission: 10/26/2014  Date of Discharge: 10/27/2014 Admitting Physician: Alveda Reasons, MD  Primary Care Provider: Odette Fraction, MD Consultants: Cardiology  Indication for Hospitalization: ACS evaluation  Discharge Diagnoses/Problem List:  Patient Active Problem List   Diagnosis Date Noted  . Pain in the chest   . Chest pain 10/26/2014  . Obesity 10/26/2014  . Hypokalemia 10/26/2014  . Manic bipolar I disorder 10/26/2014  . 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 2+ radial, PT pulses bilaterally. No LE edema Chest wall: no palpable tenderness Resp: clear bilaterally, normal effort Abdomen: obese, soft, nontender. Normal bowel sounds. Neuro: alert and oriented x3, no focal deficits  Brief Hospital Course:  [name] [name] is a 49 y.o. female presenting with substernal chest pain without exertional, pleuritic, or positional component radiating to the left chest, relieved by NTG. ECG showed diffuse ST depressions which were thought to be new. D-dimer was positive at 1.51 and CTA was performed which showed no evidence of PE. In light of this and a strong positive family history of premature CAD (brother age 33), she was evaluated by cardiology who recommended a nuclear stress test.   She was admitted for overnight observation during which time cardiac enzymes were cycles and negative. Repeat ECG in the AM showed  sinus bradycardia without ST or TW changes. NTG was administered prn chest pain with continued improvement in anginal symptoms. The results of the nuclear stress test on 7/10 were low risk scan with normal LV function. Patient was deemed stable for discharge with no further cardiology follow up recommended.   Her medical history includes HTN, asthma, chronic back pain, OSA, meniere's, IBS, constipation, bipolar disorder and depression. Home medications were continued for these conditions without any acute issues.   Issues for Follow Up:  1. Risk factor optimization for CV disease 2. Pending Hemoglobin A1c on discharge  Significant Procedures: Nuclear stress test 10/27/2014: low risk, normal LV function  Significant Labs and Imaging:   Recent Labs Lab 10/26/14 1205  WBC 5.9  HGB 12.4  HCT 37.3  PLT 264    Recent Labs Lab 10/26/14 1205 10/27/14 0030  NA 138 140  K 3.2* 4.0  CL 101 102  CO2 26 30  GLUCOSE 120* 102*  BUN 9 <5*  CREATININE 0.70 0.64  CALCIUM 8.9 9.3   TSH: 1.534 Hb A1c: pending  CTA Chest 10/26/2014:  Left arm contrast injection. SVC patent. The right ventricle is nondilated. Satisfactory opacification of pulmonary arteries noted, and there is no evidence of pulmonary emboli. Patent bilateral pulmonary veins. Fair contrast opacification of the thoracic aorta, without evidence of dissection or stenosis. There is 3 vessel brachiocephalic arterial origin anatomy without proximal stenosis. No significant atheromatous plaque.  Tiny bilateral pleural effusions. No pericardial effusion. No pneumothorax. No hilar or mediastinal adenopathy. Subsegmental atelectasis posteriorly in both lungs, left greater than right. No focal consolidation or nodule. Minimal spurring in  the mid thoracic spine. Lower thoracic spinal fixation hardware is noted, incompletely visualized distally. Sternum intact. Mild elevation of the left diaphragmatic leaflet. Visualized portions of  upper abdomen unremarkable.  Review of the MIP images confirms the above findings.  IMPRESSION: 1. Negative for acute PE or thoracic aortic dissection.  Results/Tests Pending at Time of Discharge: Hemoglobin A1c  Discharge Medications:    Medication List    STOP taking these medications        neomycin-polymyxin-hydrocortisone 3.5-10000-1 otic suspension  Commonly known as:  CORTISPORIN     promethazine 25 MG tablet  Commonly known as:  PHENERGAN      TAKE these medications        baclofen 10 MG tablet  Commonly known as:  LIORESAL  Take 10 mg by mouth 3 (three) times daily as needed.     benztropine 1 MG tablet  Commonly known as:  COGENTIN  Take 1 mg by mouth 2 (two) times daily.     CALCIUM 1200 PO  Take 1 tablet by mouth daily.     citalopram 40 MG tablet  Commonly known as:  CELEXA  Take 40 mg by mouth at bedtime. For depresion.     docusate sodium 100 MG capsule  Commonly known as:  COLACE  Take 100 mg by mouth daily.     fluticasone 50 MCG/ACT nasal spray  Commonly known as:  FLONASE  PLACE 2 SPRAYS INTO BOTH NOSTRILS DAILY.     gabapentin 600 MG tablet  Commonly known as:  NEURONTIN  Take 1 tablet (600 mg total) by mouth 3 (three) times daily. For anxiety and pain management. May have to stop to take ECT.     haloperidol 5 MG tablet  Commonly known as:  HALDOL  Take 1 tablet (5 mg total) by mouth 2 (two) times daily. For psychosis     hydrochlorothiazide 25 MG tablet  Commonly known as:  HYDRODIURIL  Take 1 tablet (25 mg total) by mouth daily.     HYDROcodone-acetaminophen 10-325 MG per tablet  Commonly known as:  NORCO  Take 1 tablet by mouth every 6 (six) hours as needed.     lubiprostone 24 MCG capsule  Commonly known as:  AMITIZA  TAKE 1 CAPSULE TWICE A DAY WITH A MEAL     methocarbamol 750 MG tablet  Commonly known as:  ROBAXIN  Take 750 mg by mouth 4 (four) times daily.     multivitamin with minerals tablet  Take 1 tablet by  mouth daily.     neomycin-bacitracin-polymyxin ointment  Commonly known as:  NEOSPORIN  Apply 1 application topically daily. apply to eye     nitroGLYCERIN 0.4 MG SL tablet  Commonly known as:  NITROSTAT  Place 1 tablet (0.4 mg total) under the tongue every 5 (five) minutes as needed for chest pain.     ondansetron 8 MG tablet  Commonly known as:  ZOFRAN  Take 8 mg by mouth every 8 (eight) hours as needed. for nausea     pantoprazole 40 MG tablet  Commonly known as:  PROTONIX  Take 1 tablet (40 mg total) by mouth 2 (two) times daily.     prazosin 5 MG capsule  Commonly known as:  MINIPRESS  Take 5 mg by mouth at bedtime.     PROAIR HFA 108 (90 BASE) MCG/ACT inhaler  Generic drug:  albuterol  INHALE 2 PUFFS INTO THE LUNGS EVERY 4 HOURS AS NEEDED FOR WHEEZING     RESTORA  PO  Take 1 capsule by mouth at bedtime.     SUMAtriptan 100 MG tablet  Commonly known as:  IMITREX  TAKE 1 TABLET (100 MG TOTAL) BY MOUTH EVERY 2 (TWO) HOURS AS NEEDED. MIGRANES     traZODone 100 MG tablet  Commonly known as:  DESYREL  Take 100 mg by mouth at bedtime.     VOLTAREN 1 % Gel  Generic drug:  diclofenac sodium  Apply topically daily.        Discharge Instructions: Please refer to Patient Instructions section of EMR for full details.  Patient was counseled important signs and symptoms that should prompt return to medical care, changes in medications, dietary instructions, activity restrictions, and follow up appointments.   Follow-Up Appointments: Follow-up Information    Follow up with [facility], MD In 1 week.   Specialty:  Family Medicine   Why:  For hospital follow up   Contact information:   [address] [place] [address] [phone]       Shatira Dobosz M Kenneshia Rehm, MD 10/27/2014, 5:26 PM PGY-3, [place]

## 2014-10-27 NOTE — Progress Notes (Signed)
Family Medicine Teaching Service Daily Progress Note Intern Pager: [phone]  Patient name: [name] [name] Medical record number: [id number] Date of birth: 1965/12/31 Age: 50 y.o. Gender: female  Primary Care Provider: Odette Fraction, MD Consultants: Cardiology Code Status: Full  Pt Overview and Major Events to Date:  7/10: Admitted for ACS r/o, NPO p MN  Assessment and Plan:  [name] [name] is a 49 y.o. female presenting with chest pain . PMH is significant for HTN, asthma, sleep apnea, depression, GERD, bipolar disorder and depression.   Chest pain: Atypical angina. Troponins neg x3, ST depression resolved on AM ECG.  -Telemetry: no events -Nuclear stress test in process this AM -Nitro 0.4mg  PRN chest pain -Dilaudid and O2 PRN (morphine allergy) -Hb A1c in process  Obstructive sleep apnea: Sleeps with wedge but not CPAP. No intervention.   Chronic back pain:  -Continue home hydrocodone, baclofen  Meniere's Disease/Vertigo: followed by Dr. Dennard Schaumann  -Continue home HCTZ (denies this is tx for HTN)  Urinary retention: -Continue home prazosin   Constipation -Continue home Amitiza, colace  GERD -Continue home Protonix   Depression/Anxiety/Bipolar I disorder: followed by Beverly Sessions, with history of ECT.  -Continue home Celexa, Cogentin, gabapentin  FEN/GI: Protonix, heart healthy diet  Prophylaxis: Lovenox  Disposition: Discharge home if no inpatient work is recommended following stress test  Subjective:  Pt with CP o/n relieved by NTG. Unable to interview patient because she is off the floor in nuclear medicine.   Objective: Temp:  [97.8 F (36.6 C)-98.7 F (37.1 C)] 98.1 F (36.7 C) (07/10 0730) Pulse Rate:  [54-117] 70 (07/10 0730) Resp:  [11-22] 22 (07/10 0730) BP: (102-137)/(59-95) 137/85 mmHg (07/10 0400) SpO2:  [81 %-99 %] 98 % (07/10 0730) Weight:  [230 lb 9.6 oz (104.6 kg)-231 lb (104.781 kg)] 231 lb (104.781 kg) (07/10 0400) Physical  Exam: Unable to examine due to pt in nuclear medicine  Laboratory:  Recent Labs Lab 10/26/14 1205  WBC 5.9  HGB 12.4  HCT 37.3  PLT 264    Recent Labs Lab 10/26/14 1205 10/27/14 0030  NA 138 140  K 3.2* 4.0  CL 101 102  CO2 26 30  BUN 9 <5*  CREATININE 0.70 0.64  CALCIUM 8.9 9.3  GLUCOSE 120* 102*   ECG this AM: Sinus bradycardia, normal axis, intervals; no ST segment or T wave changes.   [name] Pour, MD 10/27/2014, 8:52 AM PGY-3, [place] Intern pager: [phone], text pages welcome

## 2014-10-27 NOTE — Progress Notes (Addendum)
Lexiscan MV performed, 1 day study, Radiology MD to read.

## 2014-10-27 NOTE — Progress Notes (Addendum)
Patient Name: [name] [name] Date of Encounter: 10/27/2014  Active Problems:   Chest pain   Obesity   Hypokalemia   Manic bipolar I disorder   Atypical angina   Primary Cardiologist: Dr Marlou Porch  Patient Profile: 49 yo female w/ hx HTN, asthma, sleep apnea, depression, GERD, bipolar disorder and depression. Admitted 07/09 w/ chest pain, MV 07/10.  SUBJECTIVE: C/o 3/10 chest pain, continuous since 9:30 am yesterday.   OBJECTIVE Filed Vitals:   10/27/14 0011 10/27/14 0400 10/27/14 0730 10/27/14 0902  BP: 132/89 137/85  130/80  Pulse: 57 60 70 52  Temp: 98.1 F (36.7 C) 98 F (36.7 C) 98.1 F (36.7 C)   TempSrc:   Oral   Resp: 14 17 22    Height:      Weight:  231 lb (104.781 kg)    SpO2: 96% 99% 98%     Intake/Output Summary (Last 24 hours) at 10/27/14 0945 Last data filed at 10/26/14 2300  Gross per 24 hour  Intake    243 ml  Output   1050 ml  Net   -807 ml   Filed Weights   10/26/14 1900 10/27/14 0400  Weight: 230 lb 9.6 oz (104.6 kg) 231 lb (104.781 kg)    PHYSICAL EXAM General: Well developed, well nourished, female in no acute distress. Head: Normocephalic, atraumatic.  Neck: Supple without bruits, JVD not elevated. Lungs:  Resp regular and unlabored, CTA. Heart: RRR, S1, S2, no S3, S4, or murmur; no rub. Abdomen: Soft, non-tender, non-distended, BS + x 4.  Extremities: No clubbing, cyanosis, edema.  Neuro: Alert and oriented X 3. Moves all extremities spontaneously. Psych: Normal affect.  LABS: CBC: Recent Labs  10/26/14 1205  WBC 5.9  NEUTROABS 3.4  HGB 12.4  HCT 37.3  MCV 97.4  PLT 297   Basic Metabolic Panel: Recent Labs  10/26/14 1205 10/27/14 0030  NA 138 140  K 3.2* 4.0  CL 101 102  CO2 26 30  GLUCOSE 120* 102*  BUN 9 <5*  CREATININE 0.70 0.64  CALCIUM 8.9 9.3   Cardiac Enzymes: Recent Labs  10/26/14 1205 10/26/14 1946 10/27/14 0030  TROPONINI <0.03 <0.03 <0.03   D-dimer: Recent Labs  10/26/14 1227  DDIMER  1.51*   Thyroid Function Tests: Recent Labs  10/26/14 1946  TSH 1.534    TELE:  SR, seen in nuc med     ECG: SR, minimal ST depression in inferior/lateral leads, no change from 2013  Radiology/Studies: Dg Chest 2 View 10/26/2014   CLINICAL DATA:  Acute left chest pain and jaw pain today.  EXAM: CHEST  2 VIEW  COMPARISON:  03/23/2012 and prior radiographs.  12/12/2013 CT  FINDINGS: The cardiomediastinal silhouette is unremarkable.  Subsegmental left basilar atelectasis is identified.  There is no evidence of focal airspace disease, pulmonary edema, suspicious pulmonary nodule/mass, pleural effusion, or pneumothorax. No acute bony abnormalities are identified.  Thoracolumbar surgical hardware again noted.  IMPRESSION: Minimal left basilar atelectasis without other significant abnormality.   Electronically Signed   By: Margarette Canada M.D.   On: 10/26/2014 13:37   Ct Angio Chest Pe W/cm &/or Wo Cm 10/26/2014   CLINICAL DATA:  Left chest pain radiating to left shoulder, shortness of Breath  EXAM: CT ANGIOGRAPHY CHEST WITH CONTRAST  TECHNIQUE: Multidetector CT imaging of the chest was performed using the standard protocol during bolus administration of intravenous contrast. Multiplanar CT image reconstructions and MIPs were obtained to evaluate the vascular anatomy.  CONTRAST:  13mL OMNIPAQUE IOHEXOL 350 MG/ML SOLN  COMPARISON:  12/12/2013  FINDINGS: Left arm contrast injection. SVC patent. The right ventricle is nondilated. Satisfactory opacification of pulmonary arteries noted, and there is no evidence of pulmonary emboli. Patent bilateral pulmonary veins. Fair contrast opacification of the thoracic aorta, without evidence of dissection or stenosis. There is 3 vessel brachiocephalic arterial origin anatomy without proximal stenosis. No significant atheromatous plaque.  Tiny bilateral pleural effusions. No pericardial effusion. No pneumothorax. No hilar or mediastinal adenopathy. Subsegmental atelectasis  posteriorly in both lungs, left greater than right. No focal consolidation or nodule. Minimal spurring in the mid thoracic spine. Lower thoracic spinal fixation hardware is noted, incompletely visualized distally. Sternum intact. Mild elevation of the left diaphragmatic leaflet. Visualized portions of upper abdomen unremarkable.  Review of the MIP images confirms the above findings.  IMPRESSION: 1. Negative for acute PE or thoracic aortic dissection.   Electronically Signed   By: Lucrezia Europe M.D.   On: 10/26/2014 14:16     Current Medications:  . aspirin EC  325 mg Oral Daily  . benztropine  1 mg Oral BID  . calcium-vitamin D  1 tablet Oral Q breakfast  . citalopram  40 mg Oral QHS  . enoxaparin (LOVENOX) injection  50 mg Subcutaneous Q24H  . fluticasone  1 spray Each Nare Daily  . gabapentin  600 mg Oral TID  . haloperidol  5 mg Oral BID  . hydrochlorothiazide  25 mg Oral Daily  . lubiprostone  24 mcg Oral BID WC  . multivitamin with minerals  1 tablet Oral Daily  . neomycin-bacitracin-polymyxin  1 application Topical Daily  . pantoprazole  40 mg Oral BID  . prazosin  5 mg Oral QHS  . saccharomyces boulardii  250 mg Oral QHS  . sodium chloride  3 mL Intravenous Q12H  . sodium chloride  3 mL Intravenous Q12H  . traZODone  100 mg Oral QHS      ASSESSMENT AND PLAN: Active Problems:   Chest pain - ez neg MI, MV today - no further workup if negative.  Otherwise, per IM   Obesity   Hypokalemia   Manic bipolar I disorder   Atypical angina   Signed, Lenoard Aden 9:45 AM 10/27/2014  Cardiology Attending  Agree with above. Await result of myoview stress test. If low risk then she may be discharged home.  Johnatha Zeidman,M.D.  Addendum  Myoview scan is low risk. LV function is normal. Ok for discharge.  Mikle Bosworth.D.

## 2014-10-28 LAB — HEMOGLOBIN A1C
Hgb A1c MFr Bld: 5.6 % (ref 4.8–5.6)
Mean Plasma Glucose: 114 mg/dL

## 2014-11-04 ENCOUNTER — Encounter: Payer: Self-pay | Admitting: Podiatry

## 2014-11-04 ENCOUNTER — Ambulatory Visit (INDEPENDENT_AMBULATORY_CARE_PROVIDER_SITE_OTHER): Payer: Medicare Other | Admitting: Podiatry

## 2014-11-04 VITALS — BP 133/75 | HR 69 | Resp 12

## 2014-11-04 DIAGNOSIS — L89891 Pressure ulcer of other site, stage 1: Secondary | ICD-10-CM

## 2014-11-04 DIAGNOSIS — M2031 Hallux varus (acquired), right foot: Secondary | ICD-10-CM

## 2014-11-04 DIAGNOSIS — M2041 Other hammer toe(s) (acquired), right foot: Secondary | ICD-10-CM

## 2014-11-04 DIAGNOSIS — L97511 Non-pressure chronic ulcer of other part of right foot limited to breakdown of skin: Secondary | ICD-10-CM

## 2014-11-05 NOTE — Progress Notes (Signed)
Patient ID: [name] [name], female   DOB: [birth year], 49 y.o.   MRN: [id number]  Subjective: 49 year old female presents the office today for follow-up evaluation of right hallux ulceration. She states that she continues to wear the surgical shoe majority time. She did go without it one day she states that the area opened up more. She continues to apply antibiotic ointment over the wound daily and a dressing. She denies any redness or any drainage. Denies any systemic complaints of fevers, chills, nausea, vomiting. Denies any calf pain, chest pain, shows of breath. No other complaints at this time.  Objective: AAO 3, NAD DP/PT pulses palpable, CRT less than 3 seconds Protective sensation decreased with Simms Weinstein monofilament Ulceration on the right hallux with hyperkeratotic periwound. Upon debridement ulceration measures 0.4 x 0.3 cm. Wound base is granular. There is no probe to bone, undermining, tunneling. There is no swelling erythema, ascending cellulitis, fluctuance, crepitus, malodor. 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 No other open lesions or pre-ulcer lesions identified elsewhere. No pain with calf compression, swelling, warmth, erythema.  Assessment: 49 year old female right hallux ulceration, noninfected  Plan: Wound was debrided without complications to healthy, bleeding, granular wound base. Iodosorb was applied followed by dry so dressing. Continue daily dressing changes with antibiotic ointment and a bandage. Continue surgical shoe. Again discussed with her that surgery in the future help prevent recurrence as the wound heals. 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 Monitor for any clinical signs or symptoms of infection and directed to call the office immediately should any occur or go to the ER.  -Follow-up as scheduled or sooner if any problems arise. In the meantime, encouraged to call the office with any questions, concerns, change in symptoms.   Celesta Gentile,  DPM

## 2014-11-07 ENCOUNTER — Encounter: Payer: Self-pay | Admitting: Family Medicine

## 2014-11-07 ENCOUNTER — Ambulatory Visit (INDEPENDENT_AMBULATORY_CARE_PROVIDER_SITE_OTHER): Payer: Medicare Other | Admitting: Family Medicine

## 2014-11-07 VITALS — BP 110/78 | HR 76 | Temp 98.4°F | Resp 16 | Ht 63.5 in | Wt 217.0 lb

## 2014-11-07 DIAGNOSIS — F411 Generalized anxiety disorder: Secondary | ICD-10-CM

## 2014-11-07 DIAGNOSIS — R42 Dizziness and giddiness: Secondary | ICD-10-CM

## 2014-11-07 NOTE — Progress Notes (Signed)
Subjective:    Patient ID: [name] [name], female    DOB: 1965/10/20, 49 y.o.   MRN: [id number]  HPI  10/04/14 Patient has a history of peripheral neuropathy. She has difficult time feeling anything in her feet. She reports that she has had thick dead calloused skin on the plantar surface of her first right MTP joint for a long time. However 2 weeks ago she developed a small opening/ulcer that was approximately 6 mm in diameter. Now the right great toe is becoming erythematous and tender. She has a pressure callus on the plantar surface of her first MTP joint. She has a 6 mm shallow ulcer in the center of his pressure callus. There is obviously cellulitis in the toes surrounding this area.  At that time, my plan was: Patient has a pressure callus on the plantar surface of her right great toe with a ulcer and subsequent secondary cellulitis. Begin Bactrim double strength tablets 1 by mouth twice a day for 10 days. I will send the patient for an x-ray of the foot to rule out signs of chronic osteomyelitis. Using a razor blade, I debrided the thick callus skin from around the ulcer, covered the ulcer with Silvadene and then wrapped the patient in a bandage. Recheck on Monday, if no better, proceed with an MRI to rule out osteomyelitis  6/201/6 Xray revealed no evidence of osteomyelitis.  She is here for follow up.  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  Erythema has improved.  Patient continues to have a pressure ulcer with surrounding pre-ulcer callus on the plantar aspect of the right first MTP joint. She also complains of 6 months of vertigo. Seems to be related to position changes although it can also occur without position changes she also reports tinnitus and hearing loss.  At that time, my plan was: Patient's vertigo could be BPPV versus Mnire's disease. Given the triad of symptoms, I will try the patient on hydrochlorothiazide 25 mg by mouth  daily. Recheck in one month if no better proceed with an MRI of the brain and a possible ENT referral. Cellulitis on the great toe appears to be improving. There was no osteomyelitis seen on x-ray area therefore I'll arrange the patient to see an podiatrist given the pressure ulcer. I believe she would benefit from custom molded orthotics given her neuropathy to allow and facilitate healing of the pressure ulcer  11/07/14  patient is here today for follow-up. Since starting the hydrochlorothiazide, the patient continues to experience vertigo. There are no exacerbating or alleviating factors. It is not related to movement. The room will  Suddenly start to spin. She does experience some tinnitus. She denies any hearing loss at this encounter. She denies any headache or vision changes. Today in the office Dix-Hallpike maneuver is negative. Examination of the ears are normal. Neurologic exam is normal. There is no evidence of neurologic deficit. In the interval time, the patient was admitted to the hospital with atypical chest pain that was felt to be most likely a panic attack. Her father is dying from cancer and is on hospice. Patient also reports spells where she'll have a difficult time finding words and will begin to sutter Past Medical History  Diagnosis Date  . Hypertension   . Asthma   . Sleep apnea   . Anxiety   . Depression   . Bipolar 1 disorder     ect treatments last treatment Sep 02 1011  . GERD (gastroesophageal reflux disease)    Past Surgical History  Procedure Laterality Date  . Abdominal hysterectomy    . Back surgery    . Laproscopic knee surgery     Current Outpatient Prescriptions on File Prior to Visit  Medication Sig Dispense Refill  . baclofen (LIORESAL) 10 MG tablet Take 10 mg by mouth 3 (three) times daily as needed.  2  . benztropine (COGENTIN) 1 MG tablet Take 1 mg by mouth 2 (two) times daily.     . Calcium Carbonate-Vit D-Min (CALCIUM 1200 PO) Take 1 tablet by mouth  daily.    . citalopram (CELEXA) 40 MG tablet Take 40 mg by mouth at bedtime. For depresion.    . docusate sodium (COLACE) 100 MG capsule Take 100 mg by mouth daily.    . fluticasone (FLONASE) 50 MCG/ACT nasal spray PLACE 2 SPRAYS INTO BOTH NOSTRILS DAILY. 48 g 2  . gabapentin (NEURONTIN) 600 MG tablet Take 1 tablet (600 mg total) by mouth 3 (three) times daily. For anxiety and pain management. May have to stop to take ECT. 90 tablet 0  . haloperidol (HALDOL) 5 MG tablet Take 1 tablet (5 mg total) by mouth 2 (two) times daily. For psychosis 60 tablet 0  . hydrochlorothiazide (HYDRODIURIL) 25 MG tablet Take 1 tablet (25 mg total) by mouth daily. 30 tablet 3  . HYDROcodone-acetaminophen (NORCO) 10-325 MG per tablet Take 1 tablet by mouth every 6 (six) hours as needed.    . lubiprostone (AMITIZA) 24 MCG capsule TAKE 1 CAPSULE TWICE A DAY WITH A MEAL (Patient taking differently: Take 24 mcg by mouth 2 (two) times daily with a meal. ) 180 capsule 2  . methocarbamol (ROBAXIN) 750 MG tablet Take 750 mg by mouth 4 (four) times daily.    . Multiple Vitamins-Minerals (MULTIVITAMIN WITH MINERALS) tablet Take 1 tablet by mouth daily.    Marland Kitchen neomycin-bacitracin-polymyxin (NEOSPORIN) ointment Apply 1 application topically daily. apply to eye    . nitroGLYCERIN (NITROSTAT) 0.4 MG SL tablet Place 1 tablet (0.4 mg total) under the tongue every 5 (five) minutes as needed for chest pain. 30 tablet 0  . ondansetron (ZOFRAN) 8 MG tablet Take 8 mg by mouth every 8 (eight) hours as needed. for nausea  0  . pantoprazole (PROTONIX) 40 MG tablet Take 1 tablet (40 mg total) by mouth 2 (two) times daily. 180 tablet 2  . prazosin (MINIPRESS) 5 MG capsule Take 5 mg by mouth at bedtime.    Marland Kitchen PROAIR HFA 108 (90 BASE) MCG/ACT inhaler INHALE 2 PUFFS INTO THE LUNGS EVERY 4 HOURS AS NEEDED FOR WHEEZING 8.5 Inhaler 3  . Probiotic Product (RESTORA PO) Take 1 capsule by mouth at bedtime.     . SUMAtriptan (IMITREX) 100 MG tablet TAKE 1  TABLET (100 MG TOTAL) BY MOUTH EVERY 2 (TWO) HOURS AS NEEDED. MIGRANES 10 tablet 2  . traZODone (DESYREL) 100 MG tablet Take 100 mg by mouth at bedtime.  0  . VOLTAREN 1 % GEL Apply topically daily.    . [DISCONTINUED] lithium carbonate (LITHOBID) 300 MG CR tablet Take 1 tablet (300 mg total) by mouth every 12 (twelve) hours. For mood swings and depression 60 tablet 0   No current facility-administered medications on file prior to visit.   Allergies  Allergen Reactions  . Tetracyclines & Related Other (See Comments)    syncope  . Tramadol Other (See Comments)    seizures  . Ciprofloxacin Rash  . Codeine  Itching  . Morphine And Related Other (See Comments)    Memory loss  . Penicillins Hives and Rash   History   Social History  . Marital Status: Legally Separated    Spouse Name: N/A  . Number of Children: N/A  . Years of Education: N/A   Occupational History  . Not on file.   Social History Main Topics  . Smoking status: Never Smoker   . Smokeless tobacco: Not on file  . Alcohol Use: No  . Drug Use: No  . Sexual Activity: No   Other Topics Concern  . Not on file   Social History Narrative     Review of Systems  All other systems reviewed and are negative.      Objective:   Physical Exam  Constitutional: She is oriented to person, place, and time. She appears well-developed and well-nourished.  HENT:  Head: Normocephalic and atraumatic.  Right Ear: External ear normal.  Left Ear: External ear normal.  Neck: Neck supple.  Cardiovascular: Normal rate, regular rhythm and normal heart sounds.   Pulmonary/Chest: Effort normal and breath sounds normal.  Neurological: She is alert and oriented to person, place, and time. She has normal reflexes. She displays normal reflexes. No cranial nerve deficit. She exhibits normal muscle tone. Coordination normal.  Vitals reviewed.         Assessment & Plan:  Vertigo  Anxiety state   given the patient's persistent  vertigo, I will consult ENT to evaluate for possible signs Mnire's disease.   I am starting to believe this may be an element of somatization.   Anxiety is causing the patient's chest pain. I also believe it is causing her episodic word finding aphasia and stuttering. If ear nose and throat physician can find no abnormalities, I would recommend  Psychiatric follow-up.

## 2014-11-08 DIAGNOSIS — G894 Chronic pain syndrome: Secondary | ICD-10-CM | POA: Diagnosis not present

## 2014-11-08 DIAGNOSIS — M960 Pseudarthrosis after fusion or arthrodesis: Secondary | ICD-10-CM | POA: Diagnosis not present

## 2014-11-08 DIAGNOSIS — M47817 Spondylosis without myelopathy or radiculopathy, lumbosacral region: Secondary | ICD-10-CM | POA: Diagnosis not present

## 2014-11-08 DIAGNOSIS — M961 Postlaminectomy syndrome, not elsewhere classified: Secondary | ICD-10-CM | POA: Diagnosis not present

## 2014-11-18 ENCOUNTER — Other Ambulatory Visit: Payer: Self-pay | Admitting: Family Medicine

## 2014-11-18 NOTE — Telephone Encounter (Signed)
Medication refilled per protocol. 

## 2014-11-20 DIAGNOSIS — R42 Dizziness and giddiness: Secondary | ICD-10-CM | POA: Diagnosis not present

## 2014-11-20 DIAGNOSIS — H9313 Tinnitus, bilateral: Secondary | ICD-10-CM | POA: Diagnosis not present

## 2014-11-25 ENCOUNTER — Encounter: Payer: Self-pay | Admitting: Podiatry

## 2014-11-25 ENCOUNTER — Ambulatory Visit (INDEPENDENT_AMBULATORY_CARE_PROVIDER_SITE_OTHER): Payer: Medicare Other | Admitting: Podiatry

## 2014-11-25 ENCOUNTER — Ambulatory Visit (INDEPENDENT_AMBULATORY_CARE_PROVIDER_SITE_OTHER): Payer: Medicare Other

## 2014-11-25 VITALS — BP 111/68 | HR 83 | Temp 97.8°F | Resp 18

## 2014-11-25 DIAGNOSIS — L97511 Non-pressure chronic ulcer of other part of right foot limited to breakdown of skin: Secondary | ICD-10-CM | POA: Diagnosis not present

## 2014-11-25 DIAGNOSIS — R52 Pain, unspecified: Secondary | ICD-10-CM

## 2014-11-25 DIAGNOSIS — L03031 Cellulitis of right toe: Secondary | ICD-10-CM

## 2014-11-25 DIAGNOSIS — M2041 Other hammer toe(s) (acquired), right foot: Secondary | ICD-10-CM | POA: Diagnosis not present

## 2014-11-25 DIAGNOSIS — M2031 Hallux varus (acquired), right foot: Secondary | ICD-10-CM

## 2014-11-25 MED ORDER — CLINDAMYCIN HCL 300 MG PO CAPS: 300.0000 mg | ORAL_CAPSULE | Freq: Three times a day (TID) | ORAL | Status: DC

## 2014-11-26 ENCOUNTER — Other Ambulatory Visit: Payer: Self-pay | Admitting: Otolaryngology

## 2014-11-26 DIAGNOSIS — H9313 Tinnitus, bilateral: Secondary | ICD-10-CM

## 2014-11-26 DIAGNOSIS — R42 Dizziness and giddiness: Secondary | ICD-10-CM

## 2014-11-26 NOTE — Progress Notes (Signed)
Patient ID: [name] [name], female   DOB: Jul 25, 1965, 49 y.o.   MRN: [id number]  Subjective: [name] [name] presents the office today for follow-up evaluation of right hallux ulceration. She states of the last couple days she hasincreased swelling or redness to the toe. She is continue change the wound daily with antibiotic ointment and a bandage. She states that she continues to wear the surgical shoe majority time. Denies any systemic complaints of fevers, chills, nausea, vomiting. Denies any calf pain, chest pain, shows of breath. No other complaints at this time.  Objective: AAO 3, NAD DP/PT pulses palpable, CRT less than 3 seconds Protective sensation decreased with Simms Weinstein monofilament Ulceration on the right hallux with hyperkeratotic periwound. Upon debridement ulceration measures 0.3 x 0.3 cm. Wound base is granular. There is no probe to bone, undermining, tunneling. There is erythema and edema to the digit from the distal aspect the level just proximal to the IPJ. The erythema does not extend past the MPJ. There is no ascending cellulitis, fluctuance, crepitus. There is no drainage or purulence. No malodor. 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 No other open lesions or pre-ulcer lesions identified elsewhere. No pain with calf compression, swelling, warmth, erythema.  Assessment: 49 year old female right hallux ulceration, with local cellulitis  Plan: -X-rays were obtained and reviewed with the patient.  -Treatment options discussed including all alternatives, risks, and complications -Wound sharply debrided without complications to healthy, bleeding, granular tissue. Iodosorb was applied followed by dry sterile dressing. Continue daily dressing changes. -Continue surgical shoe. -Prescribed clindamycin. -Monitor for any clinical signs or symptoms of infection and directed to call the office immediately should any occur or go to the ER. -Follow-up 2 weeks or sooner if any  problems arise. In the meantime, encouraged to call the office with any questions, concerns, change in symptoms.   [name] [name], DPM

## 2014-11-28 ENCOUNTER — Ambulatory Visit (INDEPENDENT_AMBULATORY_CARE_PROVIDER_SITE_OTHER): Payer: Medicare Other | Admitting: Family Medicine

## 2014-11-28 ENCOUNTER — Encounter: Payer: Self-pay | Admitting: Family Medicine

## 2014-11-28 VITALS — BP 98/60 | HR 80 | Temp 98.0°F | Resp 16 | Ht 63.5 in | Wt 220.0 lb

## 2014-11-28 DIAGNOSIS — Z0000 Encounter for general adult medical examination without abnormal findings: Secondary | ICD-10-CM

## 2014-11-28 LAB — LIPID PANEL
Cholesterol: 242 mg/dL — ABNORMAL HIGH (ref 125–200)
HDL: 43 mg/dL — ABNORMAL LOW (ref >=46)
LDL Cholesterol: 147 mg/dL — ABNORMAL HIGH (ref <130)
Total CHOL/HDL Ratio: 5.6 Ratio — ABNORMAL HIGH (ref <=5.0)
Triglycerides: 259 mg/dL — ABNORMAL HIGH (ref <150)
VLDL: 52 mg/dL — ABNORMAL HIGH (ref <30)

## 2014-11-28 NOTE — Progress Notes (Signed)
Subjective:    Patient ID: [name] [name], female    DOB: [birth year], 49 y.o.   MRN: [id number]  HPI  patient is a 44 year oldwhitefemale here today for complete physical exam. She does report anhedonia, fatigue, no desire exercise , and poor appetite. Symptoms sound like depression and I recommended that she follow-up with her psychiatrist as soon as possible. Patient has her mammogram, pelvic exam, and Pap smear performed at her gynecologist and she defers that to them. In 2007 she had an EGD. I have no records of whether she had a colonoscopy. However given her age greater than 72 she will be due for colonoscopy if she has not had one. She previously saw Dr. Erskine Emery. Patient had a CBC, CMP, TSH, hemoglobin A1c are checked in July which were all normal patient is due for a fasting lipid panel Past Medical History  Diagnosis Date  . Hypertension   . Asthma   . Sleep apnea   . Anxiety   . Depression   . Bipolar 1 disorder     ect treatments last treatment Sep 02 1011  . GERD (gastroesophageal reflux disease)    Past Surgical History  Procedure Laterality Date  . Abdominal hysterectomy    . Back surgery    . Laproscopic knee surgery     Current Outpatient Prescriptions on File Prior to Visit  Medication Sig Dispense Refill  . baclofen (LIORESAL) 10 MG tablet Take 10 mg by mouth 3 (three) times daily as needed.  2  . benztropine (COGENTIN) 1 MG tablet Take 1 mg by mouth 2 (two) times daily.     . Calcium Carbonate-Vit D-Min (CALCIUM 1200 PO) Take 1 tablet by mouth daily.    . citalopram (CELEXA) 40 MG tablet Take 40 mg by mouth at bedtime. For depresion.    . clindamycin (CLEOCIN) 300 MG capsule Take 1 capsule (300 mg total) by mouth 3 (three) times daily. 30 capsule 2  . docusate sodium (COLACE) 100 MG capsule Take 100 mg by mouth daily.    . fluticasone (FLONASE) 50 MCG/ACT nasal spray PLACE 2 SPRAYS INTO BOTH NOSTRILS DAILY. 0.5 g 2  . gabapentin (NEURONTIN) 600 MG tablet  Take 1 tablet (600 mg total) by mouth 3 (three) times daily. For anxiety and pain management. May have to stop to take ECT. 90 tablet 0  . haloperidol (HALDOL) 5 MG tablet Take 1 tablet (5 mg total) by mouth 2 (two) times daily. For psychosis 60 tablet 0  . hydrochlorothiazide (HYDRODIURIL) 25 MG tablet Take 1 tablet (25 mg total) by mouth daily. 30 tablet 3  . HYDROcodone-acetaminophen (NORCO) 10-325 MG per tablet Take 1 tablet by mouth every 6 (six) hours as needed.    . lubiprostone (AMITIZA) 24 MCG capsule TAKE 1 CAPSULE TWICE A DAY WITH A MEAL (Patient taking differently: Take 24 mcg by mouth 2 (two) times daily with a meal. ) 180 capsule 2  . methocarbamol (ROBAXIN) 750 MG tablet Take 750 mg by mouth 4 (four) times daily.    . Multiple Vitamins-Minerals (MULTIVITAMIN WITH MINERALS) tablet Take 1 tablet by mouth daily.    Marland Kitchen neomycin-bacitracin-polymyxin (NEOSPORIN) ointment Apply 1 application topically daily. apply to eye    . nitroGLYCERIN (NITROSTAT) 0.4 MG SL tablet Place 1 tablet (0.4 mg total) under the tongue every 5 (five) minutes as needed for chest pain. 30 tablet 0  . ondansetron (ZOFRAN) 8 MG tablet Take 8 mg by mouth every 8 (eight) hours as needed.  for nausea  0  . pantoprazole (PROTONIX) 40 MG tablet Take 1 tablet (40 mg total) by mouth 2 (two) times daily. 180 tablet 2  . prazosin (MINIPRESS) 5 MG capsule Take 5 mg by mouth at bedtime.    Marland Kitchen PROAIR HFA 108 (90 BASE) MCG/ACT inhaler INHALE 2 PUFFS INTO THE LUNGS EVERY 4 HOURS AS NEEDED FOR WHEEZING 8.5 Inhaler 3  . Probiotic Product (RESTORA PO) Take 1 capsule by mouth at bedtime.     . SUMAtriptan (IMITREX) 100 MG tablet TAKE 1 TABLET (100 MG TOTAL) BY MOUTH EVERY 2 (TWO) HOURS AS NEEDED. MIGRANES 10 tablet 2  . traZODone (DESYREL) 100 MG tablet Take 100 mg by mouth at bedtime.  0  . VOLTAREN 1 % GEL Apply topically daily.    . [DISCONTINUED] lithium carbonate (LITHOBID) 300 MG CR tablet Take 1 tablet (300 mg total) by mouth  every 12 (twelve) hours. For mood swings and depression 60 tablet 0   No current facility-administered medications on file prior to visit.   Allergies  Allergen Reactions  . Tetracyclines & Related Other (See Comments)    syncope  . Tramadol Other (See Comments)    seizures  . Ciprofloxacin Rash  . Codeine Itching  . Morphine And Related Other (See Comments)    Memory loss  . Penicillins Hives and Rash   Social History   Social History  . Marital Status: Legally Separated    Spouse Name: N/A  . Number of Children: N/A  . Years of Education: N/A   Occupational History  . Not on file.   Social History Main Topics  . Smoking status: Never Smoker   . Smokeless tobacco: Not on file  . Alcohol Use: No  . Drug Use: No  . Sexual Activity: No   Other Topics Concern  . Not on file   Social History Narrative   Family History  Problem Relation Age of Onset  . Diabetes Mother   . COPD Mother   . Hypertension Mother   . Hyperlipidemia Mother   . Heart disease Father   . Hyperlipidemia Father   . Hypertension Father   . Heart disease Brother   . Heart disease Daughter       Review of Systems  All other systems reviewed and are negative.      Objective:   Physical Exam  Constitutional: She is oriented to person, place, and time. She appears well-developed and well-nourished. No distress.  HENT:  Head: Normocephalic and atraumatic.  Right Ear: External ear normal.  Left Ear: External ear normal.  Nose: Nose normal.  Mouth/Throat: Oropharynx is clear and moist. No oropharyngeal exudate.  Eyes: Conjunctivae and EOM are normal. Pupils are equal, round, and reactive to light. Right eye exhibits no discharge. Left eye exhibits no discharge. No scleral icterus.  Neck: Normal range of motion. Neck supple. No JVD present. No tracheal deviation present. No thyromegaly present.  Cardiovascular: Normal rate, regular rhythm, normal heart sounds and intact distal pulses.  Exam  reveals no gallop and no friction rub.   No murmur heard. Pulmonary/Chest: Effort normal and breath sounds normal. No stridor. No respiratory distress. She has no wheezes. She has no rales. She exhibits no tenderness.  Abdominal: Soft. Bowel sounds are normal. She exhibits no distension and no mass. There is no tenderness. There is no rebound and no guarding.  Musculoskeletal: Normal range of motion. She exhibits no edema or tenderness.  Lymphadenopathy:    She has no  cervical adenopathy.  Neurological: She is alert and oriented to person, place, and time. She has normal reflexes. She displays normal reflexes. No cranial nerve deficit. She exhibits normal muscle tone. Coordination normal.  Skin: Skin is warm. No rash noted. She is not diaphoretic. No erythema. No pallor.  Psychiatric: She has a normal mood and affect. Her behavior is normal. Judgment and thought content normal.  Vitals reviewed.         Assessment & Plan:  Routine general medical examination at a health care facility - Plan: Lipid panel  Patient has morbid obesity.  I strongly encouraged her to start exercising 30 min a day 5 days a week.  I also believe this would help manage her depression.  I recommended she follow up with her psychiatrist to discuss strategies to manage her depression.  i will check her cholesterol.  GYN will perform pap/pelvic/breast exxam.  I will contact Dr. Kelby Fam office to determine when she is due for her colonosocpy.

## 2014-12-03 ENCOUNTER — Ambulatory Visit
Admission: RE | Admit: 2014-12-03 | Discharge: 2014-12-03 | Disposition: A | Discharge status: Home / Self Care | Payer: Medicare Other | Source: Ambulatory Visit | Attending: Otolaryngology | Admitting: Otolaryngology

## 2014-12-03 DIAGNOSIS — H9313 Tinnitus, bilateral: Secondary | ICD-10-CM | POA: Diagnosis not present

## 2014-12-03 DIAGNOSIS — R42 Dizziness and giddiness: Secondary | ICD-10-CM

## 2014-12-03 MED ORDER — GADOBENATE DIMEGLUMINE 529 MG/ML IV SOLN
20.0000 mL | Freq: Once | INTRAVENOUS | Status: AC | PRN
  Administered 2014-12-03: 20 mL via INTRAVENOUS

## 2014-12-05 ENCOUNTER — Other Ambulatory Visit: Payer: Self-pay | Admitting: *Deleted

## 2014-12-05 ENCOUNTER — Other Ambulatory Visit: Payer: Self-pay | Admitting: Family Medicine

## 2014-12-05 DIAGNOSIS — J328 Other chronic sinusitis: Secondary | ICD-10-CM

## 2014-12-05 DIAGNOSIS — R42 Dizziness and giddiness: Secondary | ICD-10-CM

## 2014-12-05 DIAGNOSIS — Z1211 Encounter for screening for malignant neoplasm of colon: Secondary | ICD-10-CM

## 2014-12-05 MED ORDER — HYDROCHLOROTHIAZIDE 25 MG PO TABS: 25.0000 mg | ORAL_TABLET | Freq: Every day | ORAL | Status: DC

## 2014-12-05 MED ORDER — FLUTICASONE PROPIONATE 50 MCG/ACT NA SUSP: 2.0000 | Freq: Every day | NASAL | Status: DC

## 2014-12-09 ENCOUNTER — Ambulatory Visit (INDEPENDENT_AMBULATORY_CARE_PROVIDER_SITE_OTHER): Payer: Medicare Other | Admitting: Podiatry

## 2014-12-09 ENCOUNTER — Encounter: Payer: Self-pay | Admitting: Podiatry

## 2014-12-09 VITALS — BP 112/64 | HR 96 | Resp 18

## 2014-12-09 DIAGNOSIS — L89891 Pressure ulcer of other site, stage 1: Secondary | ICD-10-CM

## 2014-12-09 DIAGNOSIS — L97511 Non-pressure chronic ulcer of other part of right foot limited to breakdown of skin: Secondary | ICD-10-CM

## 2014-12-09 NOTE — Progress Notes (Signed)
Patient ID: [name] [name], female   DOB: Apr 15, 1966, 49 y.o.   MRN: [id number]  Subjective: [name] [name] presents the office today for follow-up evaluation of right hallux ulceration.  She states that she is frustrated as the wound appears to be at the same size as what it was previously. She continues to change the wound daily with antibiotic ointment and a bandage. She states that she continues to wear the surgical shoe majority time but requesting a new one today. She took the course of antibiotics. Denies any systemic complaints of fevers, chills, nausea, vomiting. Denies any calf pain, chest pain, shows of breath. No other complaints at this time.  Objective: AAO 3, NAD DP/PT pulses palpable, CRT less than 3 seconds Protective sensation decreased with Simms Weinstein monofilament Ulceration on the right hallux with hyperkeratotic periwound. Upon debridement ulceration measures 0.3 x 0.3 cm, which is the same as last appointment. Wound base is granular. There is no probe to bone, undermining, tunneling. There is no surrounding erythema or edema to the digit.  There is some evidence of epidermolysis around the toe from where the swelling has decreased. There is some new pink skin around the wound from where there was epidermolysis. There is no ascending cellulitis, fluctuance, crepitus. There is no drainage or purulence. No malodor. 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 No other open lesions or pre-ulcer lesions identified elsewhere. No pain with calf compression, swelling, warmth, erythema.  Assessment: 49 year old female right hallux ulceration  Plan: -X-rays were obtained and reviewed with the patient.  -Treatment options discussed including all alternatives, risks, and complications -Wound sharply debrided without complications to healthy, bleeding, granular tissue.Prsima was applied. Continue daily dressing changes with this. I gave her the rest of the Primsa that was used.    -Continue surgical shoe. A new one was dispensed today per her request.  -Continue offloading pads  -Monitor for any clinical signs or symptoms of infection and directed to call the office immediately should any occur or go to the ER. -Follow-up 2 weeks or sooner if any problems arise. In the meantime, encouraged to call the office with any questions, concerns, change in symptoms.   [name] [name], DPM

## 2014-12-09 NOTE — Patient Instructions (Signed)
Monitor for any clinical signs or symptoms of infection and directed to call the office immediately should any occur or go to the ER.  

## 2014-12-12 DIAGNOSIS — M5106 Intervertebral disc disorders with myelopathy, lumbar region: Secondary | ICD-10-CM | POA: Diagnosis not present

## 2014-12-12 DIAGNOSIS — M545 Low back pain: Secondary | ICD-10-CM | POA: Diagnosis not present

## 2014-12-12 DIAGNOSIS — M546 Pain in thoracic spine: Secondary | ICD-10-CM | POA: Diagnosis not present

## 2014-12-18 ENCOUNTER — Other Ambulatory Visit: Payer: Self-pay | Admitting: Orthopaedic Surgery

## 2014-12-18 DIAGNOSIS — M546 Pain in thoracic spine: Secondary | ICD-10-CM

## 2014-12-18 DIAGNOSIS — M5106 Intervertebral disc disorders with myelopathy, lumbar region: Secondary | ICD-10-CM

## 2014-12-20 ENCOUNTER — Encounter: Payer: Self-pay | Admitting: Gastroenterology

## 2014-12-30 ENCOUNTER — Encounter: Payer: Self-pay | Admitting: Podiatry

## 2014-12-30 ENCOUNTER — Ambulatory Visit (INDEPENDENT_AMBULATORY_CARE_PROVIDER_SITE_OTHER): Payer: Medicare Other | Admitting: Podiatry

## 2014-12-30 VITALS — BP 111/71 | HR 74 | Temp 99.0°F | Resp 18

## 2014-12-30 DIAGNOSIS — L97511 Non-pressure chronic ulcer of other part of right foot limited to breakdown of skin: Secondary | ICD-10-CM | POA: Diagnosis not present

## 2014-12-30 MED ORDER — SILVER SULFADIAZINE 1 % EX CREA: 1.0000 "application " | TOPICAL_CREAM | Freq: Every day | CUTANEOUS | Status: DC

## 2014-12-30 NOTE — Progress Notes (Signed)
Patient ID: [name] [name], female   DOB: 07-12-1965, 49 y.o.   MRN: [id number]  Subjective: [name] [name] presents the office today for follow-up evaluation of right hallux ulceration.  She states that she continues to be frustrated as the wound is not healed. She is continue with Prisma dressing changes daily. She gets some occasional bloody drainage but denies any pus. She denies any surrounding redness or any red streaks. She continues with a surgical shoe. Denies any systemic complaints of fevers, chills, nausea, vomiting. Denies any calf pain, chest pain, shows of breath. No other complaints at this time.  Objective: AAO 3, NAD DP/PT pulses palpable, CRT less than 3 seconds Protective sensation decreased with Simms Weinstein monofilament Ulceration on the right hallux with hyperkeratotic periwound. Upon debridement ulceration measures 0.5 x 0.5 cm, which is slightly larger than last appointment although there is no depth of the wound this time.. Wound base is granular, and there is actually hyper granulation tissue within the wound. There is no probe to bone, undermining, tunneling. There is no surrounding erythema or edema to the digit. There is no ascending cellulitis, fluctuance, crepitus. There is no drainage or purulence. No malodor. 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 No other open lesions or pre-ulcer lesions identified elsewhere. No pain with calf compression, swelling, warmth, erythema.  Assessment: 49 year old female right hallux ulceration  Plan: -Treatment options discussed including all alternatives, risks, and complications -Wound sharply debrided without complications to healthy, bleeding, granular tissue. Iodosorb was applied. Continue with Silvadene dressing changes. This was prescribed today.  -Continue surgical shoe. -Continue offloading pads  -Monitor for any clinical signs or symptoms of infection and directed to call the office immediately should any occur or  go to the ER. -Follow-up 2-3 weeks or sooner if any problems arise. In the meantime, encouraged to call the office with any questions, concerns, change in symptoms.  *I discussed referral to the wound care center today. She wishes to hold off on that. If the wound does not heal and continues to be the same size or increase in size we'll refer to the wound care center.  Celesta Gentile, DPM

## 2014-12-31 ENCOUNTER — Other Ambulatory Visit: Payer: Medicare Other

## 2015-01-07 ENCOUNTER — Other Ambulatory Visit: Payer: Self-pay | Admitting: Family Medicine

## 2015-01-07 ENCOUNTER — Ambulatory Visit
Admission: RE | Admit: 2015-01-07 | Discharge: 2015-01-07 | Disposition: A | Discharge status: Home / Self Care | Payer: Medicare Other | Source: Ambulatory Visit | Attending: Orthopaedic Surgery | Admitting: Orthopaedic Surgery

## 2015-01-07 ENCOUNTER — Other Ambulatory Visit: Payer: Medicare Other

## 2015-01-07 DIAGNOSIS — M5106 Intervertebral disc disorders with myelopathy, lumbar region: Secondary | ICD-10-CM

## 2015-01-07 DIAGNOSIS — M4324 Fusion of spine, thoracic region: Secondary | ICD-10-CM | POA: Diagnosis not present

## 2015-01-07 DIAGNOSIS — M4326 Fusion of spine, lumbar region: Secondary | ICD-10-CM | POA: Diagnosis not present

## 2015-01-07 DIAGNOSIS — M546 Pain in thoracic spine: Secondary | ICD-10-CM

## 2015-01-07 NOTE — Telephone Encounter (Signed)
Medication refilled per protocol. 

## 2015-01-18 ENCOUNTER — Emergency Department (HOSPITAL_COMMUNITY)
Admission: EM | Admit: 2015-01-18 | Discharge: 2015-01-18 | Disposition: A | Discharge status: Home / Self Care | Payer: Medicare Other | Attending: Emergency Medicine | Admitting: Emergency Medicine

## 2015-01-18 ENCOUNTER — Emergency Department (HOSPITAL_COMMUNITY): Payer: Medicare Other

## 2015-01-18 ENCOUNTER — Encounter (HOSPITAL_COMMUNITY): Payer: Self-pay | Admitting: Emergency Medicine

## 2015-01-18 DIAGNOSIS — R51 Headache: Secondary | ICD-10-CM | POA: Diagnosis not present

## 2015-01-18 DIAGNOSIS — R110 Nausea: Secondary | ICD-10-CM | POA: Insufficient documentation

## 2015-01-18 DIAGNOSIS — G40909 Epilepsy, unspecified, not intractable, without status epilepticus: Secondary | ICD-10-CM | POA: Insufficient documentation

## 2015-01-18 DIAGNOSIS — I10 Essential (primary) hypertension: Secondary | ICD-10-CM | POA: Diagnosis not present

## 2015-01-18 DIAGNOSIS — K219 Gastro-esophageal reflux disease without esophagitis: Secondary | ICD-10-CM | POA: Insufficient documentation

## 2015-01-18 DIAGNOSIS — R42 Dizziness and giddiness: Secondary | ICD-10-CM | POA: Diagnosis not present

## 2015-01-18 DIAGNOSIS — R1013 Epigastric pain: Secondary | ICD-10-CM | POA: Insufficient documentation

## 2015-01-18 DIAGNOSIS — R55 Syncope and collapse: Secondary | ICD-10-CM

## 2015-01-18 DIAGNOSIS — J45909 Unspecified asthma, uncomplicated: Secondary | ICD-10-CM | POA: Diagnosis not present

## 2015-01-18 DIAGNOSIS — F419 Anxiety disorder, unspecified: Secondary | ICD-10-CM | POA: Diagnosis not present

## 2015-01-18 DIAGNOSIS — F319 Bipolar disorder, unspecified: Secondary | ICD-10-CM | POA: Diagnosis not present

## 2015-01-18 DIAGNOSIS — Z880 Allergy status to penicillin: Secondary | ICD-10-CM | POA: Diagnosis not present

## 2015-01-18 DIAGNOSIS — Z79899 Other long term (current) drug therapy: Secondary | ICD-10-CM | POA: Insufficient documentation

## 2015-01-18 DIAGNOSIS — Z792 Long term (current) use of antibiotics: Secondary | ICD-10-CM | POA: Insufficient documentation

## 2015-01-18 DIAGNOSIS — Z7951 Long term (current) use of inhaled steroids: Secondary | ICD-10-CM | POA: Diagnosis not present

## 2015-01-18 DIAGNOSIS — R569 Unspecified convulsions: Secondary | ICD-10-CM | POA: Diagnosis not present

## 2015-01-18 HISTORY — DX: Unspecified convulsions: R56.9

## 2015-01-18 LAB — CBC WITH DIFFERENTIAL/PLATELET
Basophils Absolute: 0 10*3/uL (ref 0.0–0.1)
Basophils Relative: 0 %
Eosinophils Absolute: 0.1 10*3/uL (ref 0.0–0.7)
Eosinophils Relative: 2 %
HCT: 39 % (ref 36.0–46.0)
Hemoglobin: 12.9 g/dL (ref 12.0–15.0)
Lymphocytes Relative: 31 %
Lymphs Abs: 2.5 10*3/uL (ref 0.7–4.0)
MCH: 31.9 pg (ref 26.0–34.0)
MCHC: 33.1 g/dL (ref 30.0–36.0)
MCV: 96.5 fL (ref 78.0–100.0)
Monocytes Absolute: 0.4 10*3/uL (ref 0.1–1.0)
Monocytes Relative: 6 %
Neutro Abs: 5 10*3/uL (ref 1.7–7.7)
Neutrophils Relative %: 61 %
Platelets: 269 10*3/uL (ref 150–400)
RBC: 4.04 MIL/uL (ref 3.87–5.11)
RDW: 13.2 % (ref 11.5–15.5)
WBC: 8 10*3/uL (ref 4.0–10.5)

## 2015-01-18 LAB — URINALYSIS, ROUTINE W REFLEX MICROSCOPIC
Bilirubin Urine: NEGATIVE
Glucose, UA: NEGATIVE mg/dL
Hgb urine dipstick: NEGATIVE
Ketones, ur: NEGATIVE mg/dL
Leukocytes, UA: NEGATIVE
Nitrite: NEGATIVE
Protein, ur: NEGATIVE mg/dL
Specific Gravity, Urine: 1.021 (ref 1.005–1.030)
Urobilinogen, UA: 0.2 mg/dL (ref 0.0–1.0)
pH: 5 (ref 5.0–8.0)

## 2015-01-18 LAB — COMPREHENSIVE METABOLIC PANEL
ALT: 17 U/L (ref 14–54)
AST: 23 U/L (ref 15–41)
Albumin: 3.6 g/dL (ref 3.5–5.0)
Alkaline Phosphatase: 65 U/L (ref 38–126)
Anion gap: 11 (ref 5–15)
BUN: 11 mg/dL (ref 6–20)
CO2: 26 mmol/L (ref 22–32)
Calcium: 9.5 mg/dL (ref 8.9–10.3)
Chloride: 98 mmol/L — ABNORMAL LOW (ref 101–111)
Creatinine, Ser: 0.78 mg/dL (ref 0.44–1.00)
GFR calc Af Amer: >60 mL/min (ref >60)
GFR calc non Af Amer: >60 mL/min (ref >60)
Glucose, Bld: 109 mg/dL — ABNORMAL HIGH (ref 65–99)
Potassium: 3.7 mmol/L (ref 3.5–5.1)
Sodium: 135 mmol/L (ref 135–145)
Total Bilirubin: 0.4 mg/dL (ref 0.3–1.2)
Total Protein: 7.1 g/dL (ref 6.5–8.1)

## 2015-01-18 LAB — CBG MONITORING, ED: Glucose-Capillary: 123 mg/dL — ABNORMAL HIGH (ref 65–99)

## 2015-01-18 LAB — LIPASE, BLOOD: Lipase: 23 U/L (ref 22–51)

## 2015-01-18 MED ORDER — PROMETHAZINE HCL 25 MG PO TABS: 25.0000 mg | ORAL_TABLET | Freq: Four times a day (QID) | ORAL | Status: DC | PRN

## 2015-01-18 MED ORDER — SODIUM CHLORIDE 0.9 % IV BOLUS (SEPSIS)
1000.0000 mL | Freq: Once | INTRAVENOUS | Status: AC
  Administered 2015-01-18: 1000 mL via INTRAVENOUS

## 2015-01-18 MED ORDER — ACETAMINOPHEN 325 MG PO TABS
650.0000 mg | ORAL_TABLET | Freq: Once | ORAL | Status: AC
  Administered 2015-01-18: 650 mg via ORAL
  Filled 2015-01-18: qty 2

## 2015-01-18 NOTE — ED Notes (Signed)
cbg is 123

## 2015-01-18 NOTE — ED Notes (Signed)
Dr. Regenia Skeeter at the bedside

## 2015-01-18 NOTE — ED Provider Notes (Signed)
CSN: [id number]     Arrival date & time 01/18/15  1936 History   First MD Initiated Contact with Patient 01/18/15 1956     Chief Complaint  Patient presents with  . Seizures     (Consider location/radiation/quality/duration/timing/severity/associated sxs/prior Treatment) HPI  49 year old female with a remote history of seizures presents with syncope and possible seizure today. She was eating at church and while she was sitting down she felt acutely lightheaded and nauseated for about one-2 minutes. Next thing she remembers she woke up on the ground. Bystanders state she had seizure like activity, unclear exact amount of time. Unsure if she hit her head. Patient has felt nauseated for last 2-3 days with significantly decreased oral intake but denies vomiting. Has upper abdominal pain during this time, states this is chronic and not different than normal. Denies chest pain, dyspnea or palpitations. Woke up with a left sided headache after this episode.   Past Medical History  Diagnosis Date  . Hypertension   . Asthma   . Sleep apnea   . Anxiety   . Depression   . Bipolar 1 disorder ([place])     ect treatments last treatment Sep 02 1011  . GERD (gastroesophageal reflux disease)   . Seizures ([place])     last seizure was so long ago she can't remember.    Past Surgical History  Procedure Laterality Date  . Abdominal hysterectomy    . Back surgery    . Laproscopic knee surgery     Family History  Problem Relation Age of Onset  . Diabetes Mother   . COPD Mother   . Hypertension Mother   . Hyperlipidemia Mother   . Heart disease Father   . Hyperlipidemia Father   . Hypertension Father   . Heart disease Brother   . Heart disease Daughter    Social History  Substance Use Topics  . Smoking status: Never Smoker   . Smokeless tobacco: None  . Alcohol Use: No   OB History    No data available     Review of Systems  Eyes: Negative for visual disturbance.  Respiratory: Negative for  shortness of breath.   Cardiovascular: Negative for chest pain.  Gastrointestinal: Positive for nausea and abdominal pain. Negative for vomiting.  Genitourinary: Negative for dysuria.  Neurological: Positive for seizures, syncope and headaches. Negative for weakness and numbness.  All other systems reviewed and are negative.     Allergies  Tetracyclines & related; Tramadol; Ciprofloxacin; Codeine; Morphine and related; and Penicillins  Home Medications   Prior to Admission medications   Medication Sig Start Date End Date Taking? Authorizing Provider  AMITIZA 24 MCG capsule TAKE 1 CAPSULE TWICE A DAY WITH A MEAL 01/07/15   [name], MD  baclofen (LIORESAL) 10 MG tablet Take 10 mg by mouth 3 (three) times daily as needed. 08/19/14   Historical Provider, MD  benztropine (COGENTIN) 1 MG tablet Take 1 mg by mouth 2 (two) times daily.     Historical Provider, MD  Calcium Carbonate-Vit D-Min (CALCIUM 1200 PO) Take 1 tablet by mouth daily.    Historical Provider, MD  citalopram (CELEXA) 40 MG tablet Take 40 mg by mouth at bedtime. For depresion. 05/28/11   Darrol Jump, MD  clindamycin (CLEOCIN) 300 MG capsule Take 1 capsule (300 mg total) by mouth 3 (three) times daily. 11/25/14   [name], DPM  diclofenac (VOLTAREN) 75 MG EC tablet  12/06/14   Historical Provider, MD  docusate sodium (COLACE) 100 MG capsule Take 100 mg by mouth daily.    Historical Provider, MD  fluticasone (FLONASE) 50 MCG/ACT nasal spray Place 2 sprays into both nostrils daily. 12/05/14   [name], MD  gabapentin (NEURONTIN) 600 MG tablet Take 1 tablet (600 mg total) by mouth 3 (three) times daily. For anxiety and pain management. May have to stop to take ECT. 05/28/11   Darrol Jump, MD  haloperidol (HALDOL) 5 MG tablet Take 1 tablet (5 mg total) by mouth 2 (two) times daily. For psychosis 05/28/11   Darrol Jump, MD  hydrochlorothiazide (HYDRODIURIL) 25 MG tablet Take 1 tablet (25 mg total) by mouth daily.  12/05/14   [name], MD  HYDROcodone-acetaminophen (NORCO) 10-325 MG per tablet Take 1 tablet by mouth every 6 (six) hours as needed.    Historical Provider, MD  hydrOXYzine (ATARAX/VISTARIL) 10 MG tablet  11/25/14   Historical Provider, MD  methocarbamol (ROBAXIN) 750 MG tablet Take 750 mg by mouth 4 (four) times daily.    Historical Provider, MD  Multiple Vitamins-Minerals (MULTIVITAMIN WITH MINERALS) tablet Take 1 tablet by mouth daily.    Historical Provider, MD  neomycin-bacitracin-polymyxin (NEOSPORIN) ointment Apply 1 application topically daily. apply to eye    Historical Provider, MD  nitroGLYCERIN (NITROSTAT) 0.4 MG SL tablet Place 1 tablet (0.4 mg total) under the tongue every 5 (five) minutes as needed for chest pain. 10/27/14   Leone Brand, MD  ondansetron (ZOFRAN) 8 MG tablet Take 8 mg by mouth every 8 (eight) hours as needed. for nausea 09/11/14   Historical Provider, MD  pantoprazole (PROTONIX) 40 MG tablet Take 1 tablet (40 mg total) by mouth 2 (two) times daily. 08/01/14   [name], MD  prazosin (MINIPRESS) 5 MG capsule Take 5 mg by mouth at bedtime.    Historical Provider, MD  PROAIR HFA 108 (90 BASE) MCG/ACT inhaler INHALE 2 PUFFS INTO THE LUNGS EVERY 4 HOURS AS NEEDED FOR WHEEZING 10/01/14   [name], MD  Probiotic Product (RESTORA PO) Take 1 capsule by mouth at bedtime.     Historical Provider, MD  silver sulfADIAZINE (SILVADENE) 1 % cream Apply 1 application topically daily. 12/30/14   [name], DPM  SUMAtriptan (IMITREX) 100 MG tablet TAKE 1 TABLET (100 MG TOTAL) BY MOUTH EVERY 2 (TWO) HOURS AS NEEDED. MIGRANES 05/27/14   [name], MD  traZODone (DESYREL) 100 MG tablet Take 100 mg by mouth at bedtime. 07/29/14   Historical Provider, MD  VOLTAREN 1 % GEL Apply topically daily. 06/29/13   Historical Provider, MD   BP 132/76 mmHg  Pulse 73  Temp(Src) 98.6 F (37 C) (Oral)  Resp 19  Ht 5\' 4"  (1.626 m)  Wt 217 lb (98.431 kg)  BMI 37.23 kg/m2   SpO2 94% Physical Exam  Constitutional: She is oriented to person, place, and time. She appears well-developed and well-nourished.  HENT:  Head: Normocephalic and atraumatic.  Right Ear: External ear normal.  Left Ear: External ear normal.  Nose: Nose normal.  Eyes: Right eye exhibits no discharge. Left eye exhibits no discharge.  Neck: Normal range of motion. Neck supple.  Cardiovascular: Normal rate, regular rhythm and normal heart sounds.   No murmur heard. Pulmonary/Chest: Effort normal and breath sounds normal.  Abdominal: Soft. There is tenderness in the epigastric area.  Neurological: She is alert and oriented to person, place, and time.  CN 2-12 grossly intact. 5/5 strength in all  4 extremities. Normal gross sensation.  Skin: Skin is warm and dry.  Nursing note and vitals reviewed.   ED Course  Procedures (including critical care time) Labs Review Labs Reviewed  COMPREHENSIVE METABOLIC PANEL - Abnormal; Notable for the following:    Chloride 98 (*)    Glucose, Bld 109 (*)    All other components within normal limits  URINALYSIS, ROUTINE W REFLEX MICROSCOPIC (NOT AT [facility]) - Abnormal; Notable for the following:    APPearance CLOUDY (*)    All other components within normal limits  CBG MONITORING, ED - Abnormal; Notable for the following:    Glucose-Capillary 123 (*)    All other components within normal limits  CBC WITH DIFFERENTIAL/PLATELET  LIPASE, BLOOD    Imaging Review Ct Head Wo Contrast  01/18/2015   CLINICAL DATA:  Left-sided headache for several days.  EXAM: CT HEAD WITHOUT CONTRAST  TECHNIQUE: Contiguous axial images were obtained from the base of the skull through the vertex without intravenous contrast.  COMPARISON:  12/03/2014 brain MRI  FINDINGS: Skull and Sinuses:Negative for fracture or destructive process. The mastoids, middle ears, and imaged paranasal sinuses are clear.  Orbits: No acute abnormality.  Brain: Negative. No evidence of acute infarction,  hemorrhage, hydrocephalus, or mass lesion/mass effect.  IMPRESSION: Negative head CT.   Electronically Signed   By: Monte Fantasia M.D.   On: 01/18/2015 21:08   I have personally reviewed and evaluated these images and lab results as part of my medical decision-making.   EKG Interpretation   Date/Time:  Saturday January 18 2015 19:58:37 EDT Ventricular Rate:  77 PR Interval:  170 QRS Duration: 96 QT Interval:  417 QTC Calculation: 472 R Axis:   43 Text Interpretation:  Sinus rhythm Normal ECG no significant change since  July 2016 Confirmed by Regenia Skeeter  MD, Kierria Feigenbaum ([id number]) on 01/18/2015 8:15:58 PM      MDM   Final diagnoses:  Syncope, unspecified syncope type    Patient with syncope versus seizure. I favor that is more likely syncope given the lightheadedness that proceeded. She has had a headache after passing out, unclear she hit her head. CT head is unremarkable. The suspicion for aneurysm given the workup was done within a couple hours and certainly less than 6 hours from symptom onset. Neuro exam is normal. Likely she had syncope related to dehydration given significant decreased oral intake over last few days and mild hypotension on arrival. He was better and blood pressure has responded to fluids. Labwork unremarkable. EKG benign. Will give syncope and seizure precautions. She has a neurologist, recommend close follow-up with them for outpatient testing. We'll give Phenergan for her nausea.    Sherwood Gambler, MD 01/18/15 [phone]

## 2015-01-18 NOTE — ED Notes (Signed)
Patient returned from CT, phlebotomy now at the bedside.

## 2015-01-18 NOTE — ED Notes (Signed)
Per EMS, the patient was at a church event eating when she started to slump into her chair. People around her able to lower her to the floor safely, but reported seizure-like activity, with unknown length of time as too many people had differing reports of seizure activity as bystanders. Hx of seizures from years ago, but not prescribed anything. No postictal posturing noted, no injury, patient does not recall event. 4mg  of zofran given en route, cbg 133, VSS.

## 2015-01-20 ENCOUNTER — Ambulatory Visit: Payer: Medicare Other | Admitting: Podiatry

## 2015-01-21 ENCOUNTER — Ambulatory Visit (INDEPENDENT_AMBULATORY_CARE_PROVIDER_SITE_OTHER): Payer: Medicare Other | Admitting: Family Medicine

## 2015-01-21 ENCOUNTER — Encounter: Payer: Self-pay | Admitting: Family Medicine

## 2015-01-21 VITALS — BP 98/60 | HR 80 | Temp 98.3°F | Resp 18 | Ht 63.5 in | Wt 224.0 lb

## 2015-01-21 DIAGNOSIS — R1013 Epigastric pain: Secondary | ICD-10-CM | POA: Diagnosis not present

## 2015-01-21 DIAGNOSIS — G8929 Other chronic pain: Secondary | ICD-10-CM | POA: Diagnosis not present

## 2015-01-21 DIAGNOSIS — R1084 Generalized abdominal pain: Secondary | ICD-10-CM

## 2015-01-21 NOTE — Progress Notes (Signed)
Subjective:    Patient ID: [name] [name], female    DOB: [birth year], 49 y.o.   MRN: [id number]  HPI 05/31/14 Patient presents today with several years of abdominal pain. Pain is usually lower in her abdomen around the level of her umbilicus. It occurs after eating. It is also associated with constipation. She only has a bowel movement every 48 hours. This has improved since she started on Amitiza. The pain does seem to improve when she has a bowel movement. However she also has chronic epigastric abdominal pain which does not seem to improve with bowel movements. She is currently on proton asked which is helped some but the pain still persists. She denies any melena or hematochezia. She denies any fevers chills or weight loss.  At that time, my plan was: I believe the patient likely has a combination of irritable bowel syndrome, chronic constipation, and dyspepsia. I will check for H. pylori. If the patient is H pylori negative, I would focus on trying to improve the regularity of her bowel movements and also consider adding a probiotic for irritable bowel syndrome  06/25/14 She is abdominal pain is no better. Now she has not had a bowel movement for 5 days. She has profound nausea and vomiting she tried magnesium citrate without any relief. She is on chronic narcotics which may be causing narcotic-induced chronic constipation which is likely exacerbating her abdominal pain.  AT that time, my plan was: Begin Movantik 25 mg poqam and recheck in 1 week.  01/21/15 Patient went to ER Sat with syncope.  She is here today complaining of abdominal pain.  It is diffuse in the LUQ, LLQ, and periumbilical area.  She denies N,V,D.  Has BM once a week.  Movantik did not help.  Miralax QID worked in 3/16 but then she stopped it.  Amitiza does not help.   Past Medical History  Diagnosis Date  . Hypertension   . Asthma   . Sleep apnea   . Anxiety   . Depression   . Bipolar 1 disorder ([place])     ect  treatments last treatment Sep 02 1011  . GERD (gastroesophageal reflux disease)   . Seizures ([place])     last seizure was so long ago she can't remember.    Past Surgical History  Procedure Laterality Date  . Abdominal hysterectomy    . Back surgery    . Laproscopic knee surgery     Current Outpatient Prescriptions on File Prior to Visit  Medication Sig Dispense Refill  . AMITIZA 24 MCG capsule TAKE 1 CAPSULE TWICE A DAY WITH A MEAL 60 capsule 5  . benztropine (COGENTIN) 1 MG tablet Take 1 mg by mouth 2 (two) times daily.     . Calcium Carbonate-Vit D-Min (CALCIUM 1200 PO) Take 1 tablet by mouth daily.    . citalopram (CELEXA) 40 MG tablet Take 40 mg by mouth at bedtime.     . diclofenac (VOLTAREN) 75 MG EC tablet Take 75 mg by mouth 2 (two) times daily.     Marland Kitchen docusate sodium (COLACE) 100 MG capsule Take 100 mg by mouth daily.    . fluticasone (FLONASE) 50 MCG/ACT nasal spray Place 2 sprays into both nostrils daily. 1.5 g 2  . gabapentin (NEURONTIN) 600 MG tablet Take 1 tablet (600 mg total) by mouth 3 (three) times daily. For anxiety and pain management. May have to stop to take ECT. 90 tablet 0  . haloperidol (HALDOL) 5 MG tablet  Take 1 tablet (5 mg total) by mouth 2 (two) times daily. For psychosis 60 tablet 0  . hydrochlorothiazide (HYDRODIURIL) 25 MG tablet Take 1 tablet (25 mg total) by mouth daily. 90 tablet 2  . HYDROcodone-acetaminophen (NORCO) 10-325 MG per tablet Take 1 tablet by mouth every 8 (eight) hours.     . hydrOXYzine (ATARAX/VISTARIL) 10 MG tablet Take 10 mg by mouth 3 (three) times daily.     . methocarbamol (ROBAXIN) 750 MG tablet Take 750 mg by mouth 4 (four) times daily.    . Multiple Vitamins-Minerals (MULTIVITAMIN WITH MINERALS) tablet Take 1 tablet by mouth daily.    . nitroGLYCERIN (NITROSTAT) 0.4 MG SL tablet Place 1 tablet (0.4 mg total) under the tongue every 5 (five) minutes as needed for chest pain. 30 tablet 0  . ondansetron (ZOFRAN) 8 MG tablet Take 8 mg  by mouth every 8 (eight) hours as needed. for nausea  0  . pantoprazole (PROTONIX) 40 MG tablet Take 1 tablet (40 mg total) by mouth 2 (two) times daily. 180 tablet 2  . prazosin (MINIPRESS) 5 MG capsule Take 5 mg by mouth at bedtime.    Marland Kitchen PROAIR HFA 108 (90 BASE) MCG/ACT inhaler INHALE 2 PUFFS INTO THE LUNGS EVERY 4 HOURS AS NEEDED FOR WHEEZING 8.5 Inhaler 3  . Probiotic Product (RESTORA PO) Take 1 capsule by mouth at bedtime.     . promethazine (PHENERGAN) 25 MG tablet Take 1 tablet (25 mg total) by mouth every 6 (six) hours as needed for nausea or vomiting. 15 tablet 0  . silver sulfADIAZINE (SILVADENE) 1 % cream Apply 1 application topically daily. 50 g 0  . SUMAtriptan (IMITREX) 100 MG tablet TAKE 1 TABLET (100 MG TOTAL) BY MOUTH EVERY 2 (TWO) HOURS AS NEEDED. MIGRANES 10 tablet 2  . traZODone (DESYREL) 100 MG tablet Take 100 mg by mouth at bedtime.  0  . VOLTAREN 1 % GEL Apply topically daily.    . [DISCONTINUED] lithium carbonate (LITHOBID) 300 MG CR tablet Take 1 tablet (300 mg total) by mouth every 12 (twelve) hours. For mood swings and depression 60 tablet 0   No current facility-administered medications on file prior to visit.   Allergies  Allergen Reactions  . Tetracyclines & Related Other (See Comments)    syncope  . Tramadol Other (See Comments)    seizures  . Ciprofloxacin Rash  . Codeine Itching  . Morphine And Related Other (See Comments)    Memory loss  . Penicillins Hives and Rash   Social History   Social History  . Marital Status: Legally Separated    Spouse Name: N/A  . Number of Children: N/A  . Years of Education: N/A   Occupational History  . Not on file.   Social History Main Topics  . Smoking status: Never Smoker   . Smokeless tobacco: Not on file  . Alcohol Use: No  . Drug Use: No  . Sexual Activity: No   Other Topics Concern  . Not on file   Social History Narrative      Review of Systems  All other systems reviewed and are  negative.      Objective:   Physical Exam  Cardiovascular: Normal rate, regular rhythm and normal heart sounds.   No murmur heard. Pulmonary/Chest: Effort normal and breath sounds normal. No respiratory distress. She has no wheezes. She has no rales. She exhibits no tenderness.  Abdominal: Soft. Bowel sounds are normal. She exhibits no distension. There is  no tenderness. There is no rebound and no guarding.  Vitals reviewed.         Assessment & Plan:  Abdominal pain, chronic, epigastric - Plan: CT Abdomen Pelvis W Contrast  Chronic generalized abdominal pain - Plan: CT Abdomen Pelvis W Contrast  I still suspect constipation plus IBS.  Resume miralax qid and then wean down to qd once working.  Proceed with CT of abd and pelvis.  If normal and miralax does not help, consult GI.

## 2015-01-22 ENCOUNTER — Ambulatory Visit: Payer: Medicare Other | Admitting: Podiatry

## 2015-01-28 ENCOUNTER — Other Ambulatory Visit: Payer: Medicare Other

## 2015-01-28 DIAGNOSIS — M5106 Intervertebral disc disorders with myelopathy, lumbar region: Secondary | ICD-10-CM | POA: Diagnosis not present

## 2015-01-28 DIAGNOSIS — M4326 Fusion of spine, lumbar region: Secondary | ICD-10-CM | POA: Diagnosis not present

## 2015-02-04 ENCOUNTER — Telehealth: Payer: Self-pay | Admitting: *Deleted

## 2015-02-04 ENCOUNTER — Ambulatory Visit (INDEPENDENT_AMBULATORY_CARE_PROVIDER_SITE_OTHER): Payer: Medicare Other | Admitting: Podiatry

## 2015-02-04 VITALS — BP 116/67 | HR 75 | Resp 18

## 2015-02-04 DIAGNOSIS — M2031 Hallux varus (acquired), right foot: Secondary | ICD-10-CM

## 2015-02-04 DIAGNOSIS — L97511 Non-pressure chronic ulcer of other part of right foot limited to breakdown of skin: Secondary | ICD-10-CM

## 2015-02-04 DIAGNOSIS — M2041 Other hammer toe(s) (acquired), right foot: Secondary | ICD-10-CM | POA: Diagnosis not present

## 2015-02-04 MED ORDER — CLINDAMYCIN HCL 300 MG PO CAPS: 300.0000 mg | ORAL_CAPSULE | Freq: Three times a day (TID) | ORAL | Status: DC

## 2015-02-04 NOTE — Telephone Encounter (Signed)
Dr. Jacqualyn Posey ordered Calcium Alginate with silveer for daily dressing changess for left hallux ulcer 0.4x0.4 with low exudate.  Faxed.

## 2015-02-05 ENCOUNTER — Telehealth: Payer: Self-pay

## 2015-02-05 DIAGNOSIS — M960 Pseudarthrosis after fusion or arthrodesis: Secondary | ICD-10-CM | POA: Diagnosis not present

## 2015-02-05 NOTE — Progress Notes (Signed)
Patient ID: TYONA NILSEN, female   DOB: 1965-05-04, 49 y.o.   MRN: [id number]   Subjective: Ms. Muller presents the office today for follow-up evaluation of right hallux ulceration.  She states that she is doing better and she feels the wound is healing. She denies any drainage or pus coming from the area. She denies any redness or red streaks. She's continue with Silvadene dressing changes daily. She continues with a surgical shoe. Denies any systemic complaints of fevers, chills, nausea, vomiting. Denies any calf pain, chest pain, shows of breath. No other complaints at this time.  Objective: AAO 3, NAD DP/PT pulses palpable, CRT less than 3 seconds Protective sensation decreased with Simms Weinstein monofilament Ulceration on the right hallux with hyperkeratotic periwound. Upon debridement ulceration measures 0.4 x 0.4 cm after debridement. Wound base is granular. There is no probe to bone, undermining, tunneling. There is minimal surrounding erythema without any edema. There is no ascending cellulitis, fluctuance, crepitus. There is no drainage or purulence. No malodor. 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 No other open lesions or pre-ulcer lesions identified elsewhere. No pain with calf compression, swelling, warmth, erythema.  Assessment: 49 year old female right hallux ulceration  Plan: -Treatment options discussed including all alternatives, risks, and complications -Wound sharply debrided without complications to healthy, bleeding, granular tissue. Silvadene applied followed by a DSD.  -Ordered collagen with silver through [facility].  -Continue surgical shoe. -Continue offloading pads  -Due to the small amount of erythema will restart antibiotics in case of early infection. Prescribed clindamycin.  -Monitor for any clinical signs or symptoms of infection and directed to call the office immediately should any occur or go to the ER. -Follow-up 2 weeks or sooner if any problems  arise. In the meantime, encouraged to call the office with any questions, concerns, change in symptoms.   Celesta Gentile, DPM

## 2015-02-05 NOTE — Telephone Encounter (Signed)
Can you change to moderate? I have done low before and it was covered. But if it is not covered, then continue what she is using.

## 2015-02-05 NOTE — Telephone Encounter (Signed)
[facility] called stating that the insurance will not cover treatment with low exudate.

## 2015-02-06 ENCOUNTER — Ambulatory Visit
Admission: RE | Admit: 2015-02-06 | Discharge: 2015-02-06 | Disposition: A | Discharge status: Home / Self Care | Payer: Medicare Other | Source: Ambulatory Visit | Attending: Family Medicine | Admitting: Family Medicine

## 2015-02-06 ENCOUNTER — Telehealth: Payer: Self-pay

## 2015-02-06 DIAGNOSIS — R109 Unspecified abdominal pain: Secondary | ICD-10-CM | POA: Diagnosis not present

## 2015-02-06 DIAGNOSIS — G8929 Other chronic pain: Secondary | ICD-10-CM

## 2015-02-06 DIAGNOSIS — R1013 Epigastric pain: Principal | ICD-10-CM

## 2015-02-06 DIAGNOSIS — R110 Nausea: Secondary | ICD-10-CM | POA: Diagnosis not present

## 2015-02-06 DIAGNOSIS — R1084 Generalized abdominal pain: Secondary | ICD-10-CM

## 2015-02-06 MED ORDER — IOPAMIDOL (ISOVUE-300) INJECTION 61%: 125.0000 mL | Freq: Once | INTRAVENOUS | Status: DC | PRN

## 2015-02-06 NOTE — Telephone Encounter (Signed)
Order faxed for Collagen with silver, per Jacqualyn Posey

## 2015-02-06 NOTE — Telephone Encounter (Signed)
Resent Rx to include moderate exudate

## 2015-02-07 ENCOUNTER — Other Ambulatory Visit: Payer: Self-pay | Admitting: Family Medicine

## 2015-02-07 DIAGNOSIS — R1030 Lower abdominal pain, unspecified: Secondary | ICD-10-CM

## 2015-02-07 DIAGNOSIS — K589 Irritable bowel syndrome without diarrhea: Secondary | ICD-10-CM

## 2015-02-07 DIAGNOSIS — L97511 Non-pressure chronic ulcer of other part of right foot limited to breakdown of skin: Secondary | ICD-10-CM | POA: Diagnosis not present

## 2015-02-10 ENCOUNTER — Telehealth: Payer: Self-pay

## 2015-02-10 DIAGNOSIS — T84296A Other mechanical complication of internal fixation device of vertebrae, initial encounter: Secondary | ICD-10-CM | POA: Diagnosis not present

## 2015-02-10 DIAGNOSIS — K219 Gastro-esophageal reflux disease without esophagitis: Secondary | ICD-10-CM | POA: Diagnosis not present

## 2015-02-10 DIAGNOSIS — M4010 Other secondary kyphosis, site unspecified: Secondary | ICD-10-CM | POA: Diagnosis not present

## 2015-02-10 DIAGNOSIS — M4326 Fusion of spine, lumbar region: Secondary | ICD-10-CM | POA: Diagnosis not present

## 2015-02-10 DIAGNOSIS — M4806 Spinal stenosis, lumbar region: Secondary | ICD-10-CM | POA: Diagnosis not present

## 2015-02-10 DIAGNOSIS — M47812 Spondylosis without myelopathy or radiculopathy, cervical region: Secondary | ICD-10-CM | POA: Diagnosis not present

## 2015-02-10 DIAGNOSIS — M960 Pseudarthrosis after fusion or arthrodesis: Secondary | ICD-10-CM | POA: Diagnosis not present

## 2015-02-10 DIAGNOSIS — Z23 Encounter for immunization: Secondary | ICD-10-CM | POA: Diagnosis not present

## 2015-02-10 DIAGNOSIS — R569 Unspecified convulsions: Secondary | ICD-10-CM | POA: Diagnosis not present

## 2015-02-10 DIAGNOSIS — M1990 Unspecified osteoarthritis, unspecified site: Secondary | ICD-10-CM | POA: Diagnosis not present

## 2015-02-10 DIAGNOSIS — M545 Low back pain: Secondary | ICD-10-CM | POA: Diagnosis not present

## 2015-02-10 DIAGNOSIS — M5137 Other intervertebral disc degeneration, lumbosacral region: Secondary | ICD-10-CM | POA: Diagnosis not present

## 2015-02-10 DIAGNOSIS — G4730 Sleep apnea, unspecified: Secondary | ICD-10-CM | POA: Diagnosis not present

## 2015-02-10 DIAGNOSIS — I10 Essential (primary) hypertension: Secondary | ICD-10-CM | POA: Diagnosis not present

## 2015-02-10 DIAGNOSIS — M40205 Unspecified kyphosis, thoracolumbar region: Secondary | ICD-10-CM | POA: Diagnosis not present

## 2015-02-10 DIAGNOSIS — M4325 Fusion of spine, thoracolumbar region: Secondary | ICD-10-CM | POA: Diagnosis not present

## 2015-02-10 NOTE — Telephone Encounter (Signed)
Order confirmation received, processed and ship from [employer]

## 2015-02-21 ENCOUNTER — Encounter: Payer: Medicare Other | Admitting: Gastroenterology

## 2015-02-24 ENCOUNTER — Ambulatory Visit: Payer: Medicare Other | Admitting: Podiatry

## 2015-03-11 ENCOUNTER — Encounter: Payer: Medicare Other | Admitting: Gastroenterology

## 2015-03-11 DIAGNOSIS — M546 Pain in thoracic spine: Secondary | ICD-10-CM | POA: Diagnosis not present

## 2015-03-11 DIAGNOSIS — M4326 Fusion of spine, lumbar region: Secondary | ICD-10-CM | POA: Diagnosis not present

## 2015-03-12 IMAGING — CT CT L SPINE W/ CM
4 of 13 series · 11 of 33 positions shown, 12 images · non-contrast
Comparison: none

CLINICAL DATA: Lumbar spondylosis. Right hip pain. Lumbar surgery
with fusion at L2-3, L3-4, and L4-5.
TECHNIQUE: Contiguous axial images were obtained through the Thoracic and
Lumbar spine after the intrathecal infusion of infusion. Coronal and
sagittal reconstructions were obtained of the axial image sets.

[Series 5: t spine bone · axial · 0.33mm/px · z∈[-301,+19]mm · 3 of 129 slices shown, 4 images]
[im 1/129  soft-tissue]
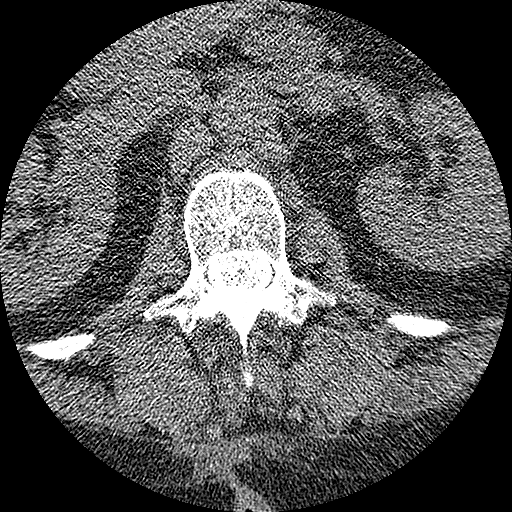
[im 1/129  bone]
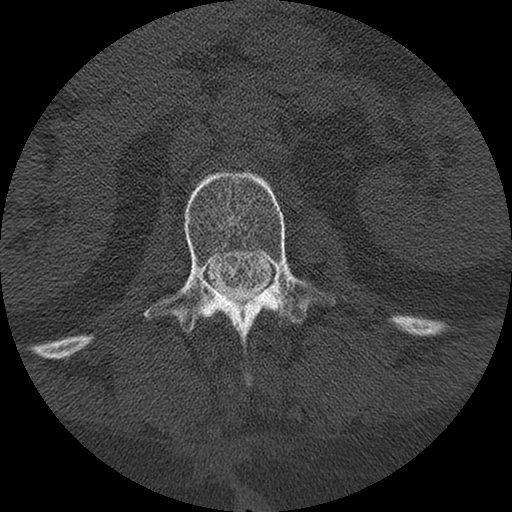
[im 65/129  bone]
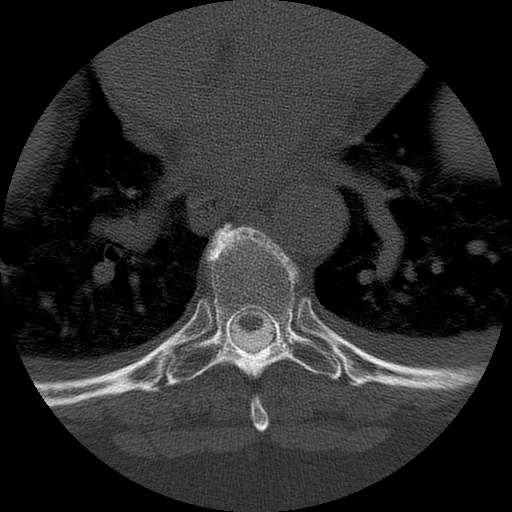
[im 129/129  bone]
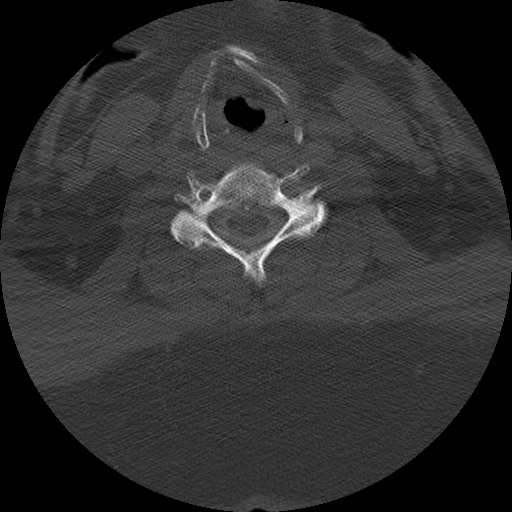

[Series 6: t spine soft · axial · 0.33mm/px · z∈[-196,-89]mm · 2 of 129 slices shown]
[im 43/129  soft-tissue]
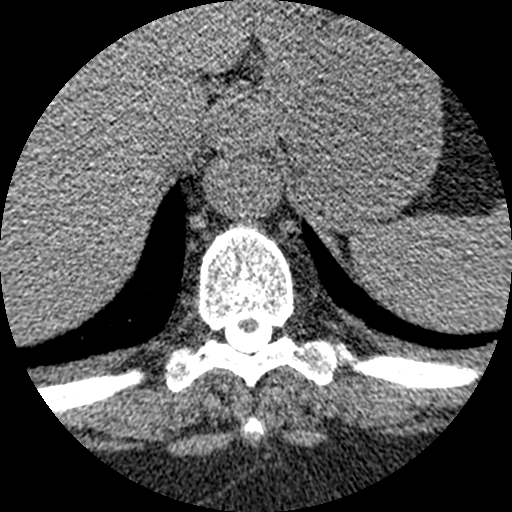
[im 86/129  soft-tissue]
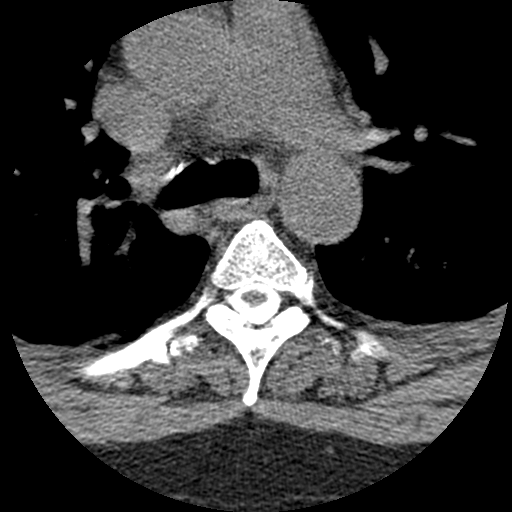

[Series 401: sag l spine · sagittal · 0.39mm/px · 3 of 48 slices shown]
[im 10/48  bone]
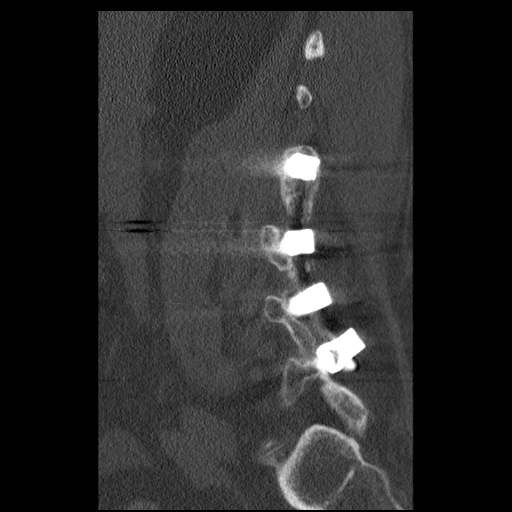
[im 19/48  bone]
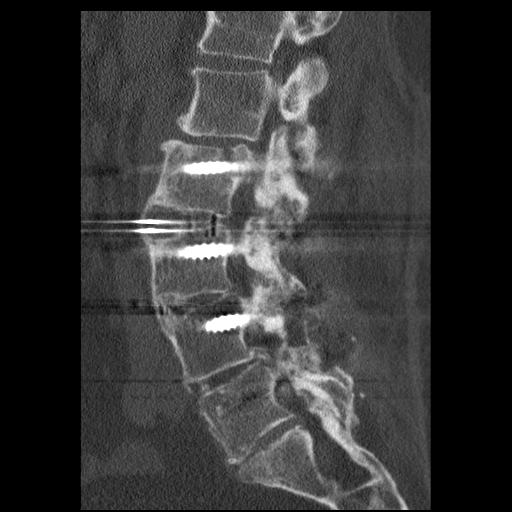
[im 29/48  bone]
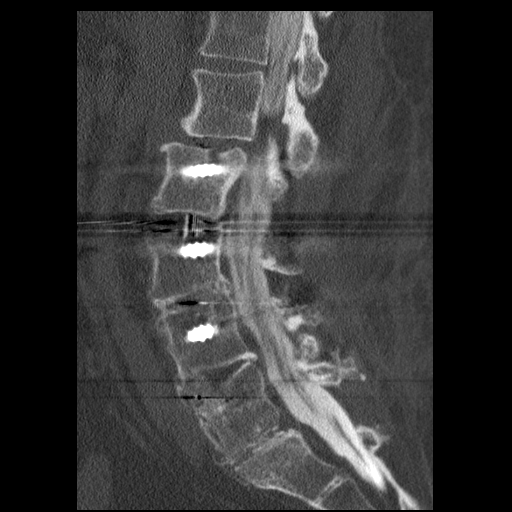

[Series 700: cor t spine · coronal · 0.49mm/px · 3 of 56 slices shown]
[im 14/56  bone]
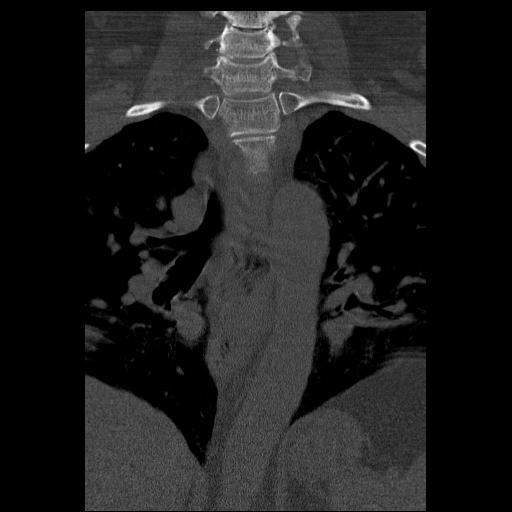
[im 28/56  bone]
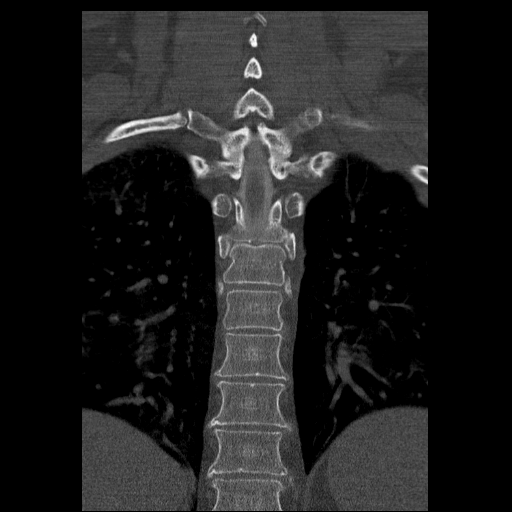
[im 42/56  bone]
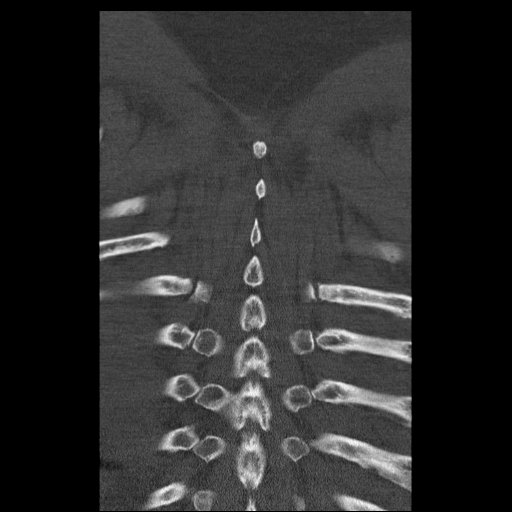

[11 of 33 positions shown; findings below may reference images not displayed]

FLUOROSCOPY TIME:  1 min 15 seconds

PROCEDURE:
LUMBAR PUNCTURE FOR THORACIC AND LUMBAR MYELOGRAM

After thorough discussion of risks and benefits of the procedure
including bleeding, infection, injury to nerves, blood vessels,
adjacent structures as well as headache and CSF leak, written and
oral informed consent was obtained. Consent was obtained by Dr.
Ander Kuehl.

Patient was positioned prone on the fluoroscopy table. Local
anesthesia was provided with 1% lidocaine without epinephrine after
prepped and draped in the usual sterile fashion. Puncture was
performed at L1-2 using a 3 1/2 inch 22-gauge spinal needle via left
paramedian approach. Using a single pass through the dura, the
needle was placed within the thecal sac, with return of clear CSF.
10 mL of 1mnipaque-Q99 was injected into the thecal sac, with normal
opacification of the nerve roots and cauda equina consistent with
free flow within the subarachnoid space. The patient was then moved
to the trendelenburg position and contrast flowed into the Thoracic
spine region.

I personally performed the lumbar puncture and administered the
intrathecal contrast. I also personally supervised acquisition of
the myelogram images.
FINDINGS: THORACIC AND LUMBAR MYELOGRAM FINDINGS:

Pedicle screw and rod fixation is present at L2-3, L3-4, and L4-5
bilaterally. The hardware is intact. Lumbar nerve roots fill
normally at these levels. There is no residual disc disease.

Grade 1 retrolisthesis is present at L1-2 with a broad-based disc
protrusion resulting in moderate central canal stenosis. Serve
particular stenosis is present bilaterally.

Myelographic images demonstrate no significant stenosis within the
thoracic spine.

The upright images demonstrate exaggerated motion and L1-2 without
significant listhesis. There is no abnormal motion through the fused
segments.

CT THORACIC MYELOGRAM FINDINGS:

The thoracic spine is imaged from the midbody of C5 through the
thoracolumbar junction. Degenerative changes are present at C5-6 and
C6-7. A broad-based disc osteophyte complex at C5-6 partially
effaces the ventral CSF without significant stenosis. The foramina
are patent bilaterally.

No significant focal disc protrusions or stenoses are present within
the thoracic spine. The foramina are patent bilaterally. No
significant facet disease is evident.

The visualized mediastinal structures are within normal limits. The
paraspinous soft tissues are unremarkable.

CT LUMBAR MYELOGRAM FINDINGS:

The conus medullaris terminates at L1.

Retrolisthesis at L1-2 is progressed. There is a fracture through
the posterior superior endplate of L2 with slight retropulsion of
bone. A broad-based disc protrusion is asymmetric to left. Advanced
facet hypertrophy is noted. Subarticular stenosis is present
bilaterally. Mild to moderate foraminal stenosis is worse on the
left.

L2-3: The patient is fused. A wide laminectomy was performed. A left
foraminotomy was performed. There is no residual or recurrent
stenosis.

L3-4: A a left laminectomy was performed. The left foraminotomy was
performed. There is no residual or recurrent stenosis.

L4-5: The patient is status post right laminectomy and foraminotomy.
No residual or recurrent stenosis is present.

L5-S1: The posterior elements are fused. Previous pedicle screws at
S1 have been removed. There is fusion across the anterior disc space
is well. No residual or recurrent stenosis is evident.
IMPRESSION: 1. Fusion at L2-3, L3-4, L4-5, and L5-S1 without significant
residual or recurrent stenosis at these levels.
2. Adjacent level disease with progressive retrolisthesis and
broad-based disc protrusion at L1-2.
3. New posterior superior endplate fracture at L2 with slight
retropulsion of bone contributing to the stenosis at this level.
4. Mild subarticular and mild to moderate foraminal stenosis
bilaterally at L1-2 is worse on the left.
5. No significant focal disc protrusion or stenosis in the thoracic
spine.
6. Spondylosis of the lower cervical spine with mild central canal
narrowing at C5-6.

## 2015-03-17 ENCOUNTER — Ambulatory Visit: Payer: Medicare Other | Admitting: Podiatry

## 2015-03-25 ENCOUNTER — Telehealth: Payer: Self-pay | Admitting: Family Medicine

## 2015-03-25 MED ORDER — PROMETHAZINE HCL 25 MG PO TABS: 25.0000 mg | ORAL_TABLET | Freq: Four times a day (QID) | ORAL | Status: DC | PRN

## 2015-03-25 NOTE — Telephone Encounter (Signed)
Medication refilled per protocol. 

## 2015-04-07 ENCOUNTER — Ambulatory Visit (INDEPENDENT_AMBULATORY_CARE_PROVIDER_SITE_OTHER): Payer: Medicare Other | Admitting: Podiatry

## 2015-04-07 ENCOUNTER — Encounter: Payer: Self-pay | Admitting: Podiatry

## 2015-04-07 VITALS — BP 123/77 | HR 72 | Temp 99.2°F | Resp 16

## 2015-04-07 DIAGNOSIS — L03031 Cellulitis of right toe: Secondary | ICD-10-CM | POA: Diagnosis not present

## 2015-04-07 DIAGNOSIS — L97511 Non-pressure chronic ulcer of other part of right foot limited to breakdown of skin: Secondary | ICD-10-CM

## 2015-04-07 MED ORDER — CLINDAMYCIN HCL 300 MG PO CAPS: 300.0000 mg | ORAL_CAPSULE | Freq: Three times a day (TID) | ORAL | Status: DC

## 2015-04-10 DIAGNOSIS — G894 Chronic pain syndrome: Secondary | ICD-10-CM | POA: Diagnosis not present

## 2015-04-10 DIAGNOSIS — M546 Pain in thoracic spine: Secondary | ICD-10-CM | POA: Diagnosis not present

## 2015-04-10 DIAGNOSIS — M4326 Fusion of spine, lumbar region: Secondary | ICD-10-CM | POA: Diagnosis not present

## 2015-04-11 DIAGNOSIS — L03031 Cellulitis of right toe: Secondary | ICD-10-CM | POA: Insufficient documentation

## 2015-04-11 DIAGNOSIS — L97519 Non-pressure chronic ulcer of other part of right foot with unspecified severity: Secondary | ICD-10-CM | POA: Insufficient documentation

## 2015-04-11 NOTE — Progress Notes (Signed)
Patient ID: [name] [name], female   DOB: [birth year], 49 y.o.   MRN: [id number]   Subjective: [name] [name] presents the office today for follow-up evaluation of right hallux ulceration. I have not seen her since 02/04/2015 and she states that she has undergone back surgery and she's had a lot going on in been unable to come to the office. She stated the wound has continued over the last week the wound is worsening she has noticed some redness around the wound to her right toe as well as some malodor. She denies any posterior from the area and denies any red streaks.  Denies any systemic complaints of fevers, chills, nausea, vomiting. Denies any calf pain, chest pain, shows of breath. No other complaints at this time.  Objective: AAO 3, NAD DP/PT pulses palpable, CRT less than 3 seconds Protective sensation decreased with Simms Weinstein monofilament Ulceration on the right hallux with hyperkeratotic periwound. Upon debridement ulceration measures 0.8 x 0.8 cm after debridement. Wound base is granular. There is no probe to bone, undermining, tunneling. There is surrounding erythema without any edema and there is no ascending cellulitis, fluctuance, crepitus. There is no drainage or purulence. Mild malodor. 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 No other open lesions or pre-ulcer lesions identified elsewhere. No pain with calf compression, swelling, warmth, erythema.  Assessment: 49 year old female right hallux ulceration with localized infection.   Plan: -Treatment options discussed including all alternatives, risks, and complications -Wound sharply debrided without complications to healthy, bleeding, granular tissue. Silvadene applied followed by a DSD.  -Continue surgical shoe with offloading pads.  -Clindamycin -She schedule have another back surgery coming up. I discussed with her that if the infection is not resolved that she is to inform the surgeon. -Monitor for any clinical signs or  symptoms of infection and directed to call the office immediately should any occur or go to the ER. -Follow-upas scheduled or sooner if any problems arise. In the meantime, encouraged to call the office with any questions, concerns, change in symptoms.   Celesta Gentile, DPM

## 2015-04-25 ENCOUNTER — Ambulatory Visit: Payer: Medicare Other | Admitting: Podiatry

## 2015-05-08 DIAGNOSIS — I10 Essential (primary) hypertension: Secondary | ICD-10-CM | POA: Diagnosis not present

## 2015-05-08 DIAGNOSIS — G894 Chronic pain syndrome: Secondary | ICD-10-CM | POA: Diagnosis not present

## 2015-05-08 DIAGNOSIS — T84296A Other mechanical complication of internal fixation device of vertebrae, initial encounter: Secondary | ICD-10-CM | POA: Diagnosis not present

## 2015-05-08 DIAGNOSIS — M549 Dorsalgia, unspecified: Secondary | ICD-10-CM | POA: Diagnosis not present

## 2015-05-12 ENCOUNTER — Ambulatory Visit: Payer: Medicare Other | Admitting: Podiatry

## 2015-05-23 ENCOUNTER — Other Ambulatory Visit: Payer: Self-pay | Admitting: Family Medicine

## 2015-05-23 DIAGNOSIS — M4010 Other secondary kyphosis, site unspecified: Secondary | ICD-10-CM | POA: Diagnosis not present

## 2015-05-23 DIAGNOSIS — M960 Pseudarthrosis after fusion or arthrodesis: Secondary | ICD-10-CM | POA: Diagnosis not present

## 2015-05-23 NOTE — Telephone Encounter (Signed)
Medication refilled per protocol. 

## 2015-05-26 DIAGNOSIS — G4730 Sleep apnea, unspecified: Secondary | ICD-10-CM | POA: Diagnosis not present

## 2015-05-26 DIAGNOSIS — M4854XA Collapsed vertebra, not elsewhere classified, thoracic region, initial encounter for fracture: Secondary | ICD-10-CM | POA: Diagnosis not present

## 2015-05-26 DIAGNOSIS — Z881 Allergy status to other antibiotic agents status: Secondary | ICD-10-CM | POA: Diagnosis not present

## 2015-05-26 DIAGNOSIS — Z4789 Encounter for other orthopedic aftercare: Secondary | ICD-10-CM | POA: Diagnosis not present

## 2015-05-26 DIAGNOSIS — J45909 Unspecified asthma, uncomplicated: Secondary | ICD-10-CM | POA: Diagnosis not present

## 2015-05-26 DIAGNOSIS — K219 Gastro-esophageal reflux disease without esophagitis: Secondary | ICD-10-CM | POA: Diagnosis not present

## 2015-05-26 DIAGNOSIS — Z885 Allergy status to narcotic agent status: Secondary | ICD-10-CM | POA: Diagnosis not present

## 2015-05-26 DIAGNOSIS — T84216A Breakdown (mechanical) of internal fixation device of vertebrae, initial encounter: Secondary | ICD-10-CM | POA: Diagnosis not present

## 2015-05-26 DIAGNOSIS — M960 Pseudarthrosis after fusion or arthrodesis: Secondary | ICD-10-CM | POA: Diagnosis not present

## 2015-05-26 DIAGNOSIS — M961 Postlaminectomy syndrome, not elsewhere classified: Secondary | ICD-10-CM | POA: Diagnosis not present

## 2015-05-26 DIAGNOSIS — M4324 Fusion of spine, thoracic region: Secondary | ICD-10-CM | POA: Diagnosis not present

## 2015-05-26 DIAGNOSIS — Z23 Encounter for immunization: Secondary | ICD-10-CM | POA: Diagnosis not present

## 2015-05-26 DIAGNOSIS — Z9049 Acquired absence of other specified parts of digestive tract: Secondary | ICD-10-CM | POA: Diagnosis not present

## 2015-05-26 DIAGNOSIS — M4014 Other secondary kyphosis, thoracic region: Secondary | ICD-10-CM | POA: Diagnosis not present

## 2015-05-26 DIAGNOSIS — Z880 Allergy status to penicillin: Secondary | ICD-10-CM | POA: Diagnosis not present

## 2015-05-26 DIAGNOSIS — I10 Essential (primary) hypertension: Secondary | ICD-10-CM | POA: Diagnosis not present

## 2015-05-26 DIAGNOSIS — M4807 Spinal stenosis, lumbosacral region: Secondary | ICD-10-CM | POA: Diagnosis not present

## 2015-05-26 DIAGNOSIS — Z79899 Other long term (current) drug therapy: Secondary | ICD-10-CM | POA: Diagnosis not present

## 2015-05-26 DIAGNOSIS — M4010 Other secondary kyphosis, site unspecified: Secondary | ICD-10-CM | POA: Diagnosis not present

## 2015-05-26 DIAGNOSIS — T84038A Mechanical loosening of other internal prosthetic joint, initial encounter: Secondary | ICD-10-CM | POA: Diagnosis not present

## 2015-05-26 DIAGNOSIS — T84296A Other mechanical complication of internal fixation device of vertebrae, initial encounter: Secondary | ICD-10-CM | POA: Diagnosis not present

## 2015-05-26 DIAGNOSIS — M4326 Fusion of spine, lumbar region: Secondary | ICD-10-CM | POA: Diagnosis not present

## 2015-05-31 ENCOUNTER — Ambulatory Visit: Payer: Medicare Other | Admitting: Internal Medicine

## 2015-06-23 DIAGNOSIS — M4326 Fusion of spine, lumbar region: Secondary | ICD-10-CM | POA: Diagnosis not present

## 2015-06-23 DIAGNOSIS — G894 Chronic pain syndrome: Secondary | ICD-10-CM | POA: Diagnosis not present

## 2015-06-30 ENCOUNTER — Other Ambulatory Visit: Payer: Self-pay | Admitting: Family Medicine

## 2015-06-30 NOTE — Telephone Encounter (Signed)
Refill appropriate and filled per protocol. 

## 2015-07-14 ENCOUNTER — Encounter: Payer: Self-pay | Admitting: Podiatry

## 2015-07-14 ENCOUNTER — Ambulatory Visit (INDEPENDENT_AMBULATORY_CARE_PROVIDER_SITE_OTHER): Payer: Medicare Other | Admitting: Podiatry

## 2015-07-14 VITALS — BP 123/76 | HR 109 | Temp 99.7°F | Resp 18

## 2015-07-14 DIAGNOSIS — L03031 Cellulitis of right toe: Secondary | ICD-10-CM

## 2015-07-14 DIAGNOSIS — L89891 Pressure ulcer of other site, stage 1: Secondary | ICD-10-CM

## 2015-07-14 DIAGNOSIS — L97511 Non-pressure chronic ulcer of other part of right foot limited to breakdown of skin: Secondary | ICD-10-CM

## 2015-07-14 MED ORDER — CLINDAMYCIN HCL 300 MG PO CAPS: 300.0000 mg | ORAL_CAPSULE | Freq: Three times a day (TID) | ORAL | Status: DC

## 2015-07-15 NOTE — Progress Notes (Signed)
Patient ID: [name] [name], female   DOB: Jul 19, 1965, 50 y.o.   MRN: [id number]  Subjective: [name] [name] presents the office today for concerns of reoccurring ulcer to the right big toe. Also when she does have the wound closed completely. Also since last appointment she's had back surgery. She states over the last week the wound is opened up and the toe is been red. She denies any drainage or pus. Denies any red streaks. No malodor. She is, not taking any antibiotics. Denies any systemic complaints of fevers, chills, nausea, vomiting. Denies any calf pain, chest pain, shows of breath. No other complaints at this time.  Objective: AAO 3, NAD DP/PT pulses palpable, CRT less than 3 seconds Protective sensation decreased with Simms Weinstein monofilament On the plantar medial aspect of the hallux is a superficial wound measuring 0.4 x 0.3 cm after debridement with a small hyperkeratotic periwound. There is no probing, undermining or tunneling. There is erythema to the hallux to the level of the MPJ however there is no streaking present. There is no malodor. No fluctuance or crepitus. No purulence. 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 No other open lesions or pre-ulcer lesions identified elsewhere. No pain with calf compression, swelling, warmth, erythema.  Assessment: 50 year old female with reoccurring right hallux ulceration, cellulitis  Plan: -Treatment options discussed including all alternatives, risks, and complications -Wound was debrided today without complications. -Continue daily dressing changes. Iodosorb was applied today. -Will start clindamycin due to cellulitis. -Monitor for any clinical signs or symptoms of infection and directed to call the office immediately should any occur or go to the ER. -Follow-up as scheduled or sooner if any problems arise. In the meantime, encouraged to call the office with any questions, concerns, change in symptoms.   Celesta Gentile, DPM

## 2015-07-25 ENCOUNTER — Other Ambulatory Visit: Payer: Self-pay

## 2015-07-25 DIAGNOSIS — Z1231 Encounter for screening mammogram for malignant neoplasm of breast: Secondary | ICD-10-CM

## 2015-07-28 ENCOUNTER — Encounter: Payer: Self-pay | Admitting: Podiatry

## 2015-07-28 ENCOUNTER — Ambulatory Visit (INDEPENDENT_AMBULATORY_CARE_PROVIDER_SITE_OTHER): Payer: Medicare Other

## 2015-07-28 ENCOUNTER — Ambulatory Visit (INDEPENDENT_AMBULATORY_CARE_PROVIDER_SITE_OTHER): Payer: Medicare Other | Admitting: Podiatry

## 2015-07-28 VITALS — BP 144/88 | HR 87 | Resp 12

## 2015-07-28 DIAGNOSIS — L03031 Cellulitis of right toe: Secondary | ICD-10-CM

## 2015-07-28 DIAGNOSIS — L97511 Non-pressure chronic ulcer of other part of right foot limited to breakdown of skin: Secondary | ICD-10-CM

## 2015-07-28 DIAGNOSIS — M79671 Pain in right foot: Secondary | ICD-10-CM

## 2015-07-28 LAB — CBC WITH DIFFERENTIAL/PLATELET
Basophils Absolute: 0 cells/uL (ref 0–200)
Basophils Relative: 0 %
Eosinophils Absolute: 180 cells/uL (ref 15–500)
Eosinophils Relative: 3 %
HCT: 36.8 % (ref 35.0–45.0)
Hemoglobin: 12.2 g/dL (ref 11.7–15.5)
Lymphocytes Relative: 41 %
Lymphs Abs: 2460 cells/uL (ref 850–3900)
MCH: 31.1 pg (ref 27.0–33.0)
MCHC: 33.2 g/dL (ref 32.0–36.0)
MCV: 93.9 fL (ref 80.0–100.0)
MPV: 8.8 fL (ref 7.5–12.5)
Monocytes Absolute: 300 cells/uL (ref 200–950)
Monocytes Relative: 5 %
Neutro Abs: 3060 cells/uL (ref 1500–7800)
Neutrophils Relative %: 51 %
Platelets: 310 10*3/uL (ref 140–400)
RBC: 3.92 MIL/uL (ref 3.80–5.10)
RDW: 13.7 % (ref 11.0–15.0)
WBC: 6 10*3/uL (ref 3.8–10.8)

## 2015-07-28 LAB — BASIC METABOLIC PANEL
BUN: 10 mg/dL (ref 7–25)
CO2: 28 mmol/L (ref 20–31)
Calcium: 9.2 mg/dL (ref 8.6–10.4)
Chloride: 101 mmol/L (ref 98–110)
Creat: 0.63 mg/dL (ref 0.50–1.05)
Glucose, Bld: 118 mg/dL — ABNORMAL HIGH (ref 65–99)
Potassium: 3.6 mmol/L (ref 3.5–5.3)
Sodium: 138 mmol/L (ref 135–146)

## 2015-07-28 LAB — C-REACTIVE PROTEIN: CRP: 1.7 mg/dL — ABNORMAL HIGH (ref <0.60)

## 2015-07-28 MED ORDER — SULFAMETHOXAZOLE-TRIMETHOPRIM 800-160 MG PO TABS: 1.0000 | ORAL_TABLET | Freq: Two times a day (BID) | ORAL | Status: DC

## 2015-07-28 NOTE — Progress Notes (Signed)
Patient ID: JOSELIN SCHOENDORF, female   DOB: [birth year], 50 y.o.   MRN: [id number]  Subjective: Ms. Dollard presents the office today for follow-up evaluation of wound to the right big toe. She states is still somewhat red. Denies any pus that she does get some clear to yellow drainage coming from the wound. Denies any red streaks. Denies any pain. She has finished her course of antibiotics.Denies any systemic complaints of fevers, chills, nausea, vomiting. Denies any calf pain, chest pain, shows of breath. No other complaints at this time.  Objective: AAO 3, NAD DP/PT pulses palpable, CRT less than 3 seconds Protective sensation decreased with Simms Weinstein monofilament On the plantar medial aspect of the hallux is a superficial wound measuring 0.5 x 0.4 cm after debridement with a small hyperkeratotic periwound. erythema appears to be somewhat improved however does continue. There is no probing, undermining or tunneling. There is erythema to the hallux to the level of the MPJ however there is no streaking present. There is no malodor. No fluctuance or crepitus. No purulence. 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 No other open lesions or pre-ulcer lesions identified elsewhere. No pain with calf compression, swelling, warmth, erythema.  Assessment: 50 year old female with reoccurring right hallux ulceration, cellulitis  Plan: -Treatment options discussed including all alternatives, risks, and complications -Wound was debrided today without complications. -Continue daily dressing changes. Iodosorb was applied today. -Switch to Bactrim which is ordered today. -Blood work ordered today.  -X-rays were obtained. No definitive evidence of cortical changes at this time suggestive of osteomyelitis. If symptoms continue will obtain an MRI. -Monitor for any clinical signs or symptoms of infection and directed to call the office immediately should any occur or go to the ER. -Follow-up in 2 weeks  or sooner if any problems arise. In the meantime, encouraged to call the office with any questions, concerns, change in symptoms.   Celesta Gentile, DPM

## 2015-07-29 LAB — SEDIMENTATION RATE: Sed Rate: 34 mm/hr — ABNORMAL HIGH (ref 0–30)

## 2015-08-01 ENCOUNTER — Ambulatory Visit: Payer: Medicare Other

## 2015-08-11 ENCOUNTER — Ambulatory Visit (INDEPENDENT_AMBULATORY_CARE_PROVIDER_SITE_OTHER): Payer: Medicare Other | Admitting: Podiatry

## 2015-08-11 ENCOUNTER — Encounter: Payer: Self-pay | Admitting: Podiatry

## 2015-08-11 DIAGNOSIS — L97519 Non-pressure chronic ulcer of other part of right foot with unspecified severity: Secondary | ICD-10-CM

## 2015-08-11 DIAGNOSIS — L03031 Cellulitis of right toe: Secondary | ICD-10-CM

## 2015-08-11 MED ORDER — PROMETHAZINE HCL 12.5 MG PO TABS: 12.5000 mg | ORAL_TABLET | Freq: Three times a day (TID) | ORAL | Status: DC | PRN

## 2015-08-11 MED ORDER — SULFAMETHOXAZOLE-TRIMETHOPRIM 800-160 MG PO TABS: 1.0000 | ORAL_TABLET | Freq: Two times a day (BID) | ORAL | Status: DC

## 2015-08-12 ENCOUNTER — Telehealth: Payer: Self-pay | Admitting: *Deleted

## 2015-08-12 DIAGNOSIS — L03031 Cellulitis of right toe: Secondary | ICD-10-CM | POA: Insufficient documentation

## 2015-08-12 NOTE — Progress Notes (Signed)
Patient ID: [name] [name], female   DOB: 03-Mar-1966, 50 y.o.   MRN: [id number]  Subjective: Ms. [name] presents the office today for follow-up evaluation of wound to the right big toe. She states the toe is about the same compared to last appointment. She has continued on Bactrim and finish her course. She denies any drainage or pus. Denies any red streaks. No pain due to neuropathy. Denies any systemic complaints of fevers, chills, nausea, vomiting. Denies any calf pain, chest pain, shows of breath. No other complaints at this time.  Objective: AAO 3, NAD DP/PT pulses palpable, CRT less than 3 seconds Protective sensation decreased with Simms Weinstein monofilament On the plantar medial aspect of the hallux is a superficial wound measuring 0.4 x 0.4 cm after debridement with a small hyperkeratotic periwound eIn the wound appears to be just about the same compared to last appointment. There is continued erythema to the hallux how her has improved somewhat but not significantly. There is no fluctuance or crepitus. No pus. No malodor. There is hallux malleus present. No other open lesions or pre-ulcer lesions identified elsewhere. No pain with calf compression, swelling, warmth, erythema.  Assessment: 50 year old female with reoccurring right hallux ulceration, cellulitis  Plan: -Treatment options discussed including all alternatives, risks, and complications -Wound was debrided today without complications. Silvadene was applied followed by dressing. Continued with daily dressing changes. -At this point due to continued recurrent infections recommended MRI to rule out osteomyelitis. This is ordered today. -Offloading. -Monitor for any clinical signs or symptoms of infection and directed to call the office immediately should any occur or go to the ER. -Follow-up in 2 weeks or sooner if any problems arise. In the meantime, encouraged to call the office with any questions, concerns, change in  symptoms.   Celesta Gentile, DPM

## 2015-08-12 NOTE — Telephone Encounter (Addendum)
[employer] DOES NOT REQUIRED PRIOR AUTHORIZATION FOR [address] MRI RIGHT FOOT WITH/WITHOUT CONTRAST, REFERENCE# [id number].  ORDERS FAXED TO [facility].

## 2015-08-13 ENCOUNTER — Ambulatory Visit: Payer: Medicare Other

## 2015-08-27 DIAGNOSIS — M791 Myalgia: Secondary | ICD-10-CM | POA: Diagnosis not present

## 2015-08-27 DIAGNOSIS — M4325 Fusion of spine, thoracolumbar region: Secondary | ICD-10-CM | POA: Diagnosis not present

## 2015-08-27 DIAGNOSIS — G894 Chronic pain syndrome: Secondary | ICD-10-CM | POA: Diagnosis not present

## 2015-08-29 ENCOUNTER — Ambulatory Visit: Payer: Medicare Other | Admitting: Podiatry

## 2015-09-05 ENCOUNTER — Encounter: Payer: Self-pay | Admitting: Podiatry

## 2015-09-05 ENCOUNTER — Ambulatory Visit (INDEPENDENT_AMBULATORY_CARE_PROVIDER_SITE_OTHER): Payer: Medicare Other | Admitting: Podiatry

## 2015-09-05 VITALS — BP 123/74 | HR 79 | Resp 18

## 2015-09-05 DIAGNOSIS — L97519 Non-pressure chronic ulcer of other part of right foot with unspecified severity: Secondary | ICD-10-CM

## 2015-09-09 DIAGNOSIS — G894 Chronic pain syndrome: Secondary | ICD-10-CM | POA: Diagnosis not present

## 2015-09-09 DIAGNOSIS — M4325 Fusion of spine, thoracolumbar region: Secondary | ICD-10-CM | POA: Diagnosis not present

## 2015-09-09 DIAGNOSIS — Z79899 Other long term (current) drug therapy: Secondary | ICD-10-CM | POA: Diagnosis not present

## 2015-09-09 DIAGNOSIS — Z79891 Long term (current) use of opiate analgesic: Secondary | ICD-10-CM | POA: Diagnosis not present

## 2015-09-09 NOTE — Progress Notes (Signed)
Patient ID: ARAMI [name] [name], female   DOB: 15-Feb-1966, 50 y.o.   MRN: [id number]  Subjective: [name] presents the office today for follow-up evaluation of wound to the right big toe. She states that she is doing much better than the right big toe and the wound is almost healed the redness the swelling is almost resolved as well. She's having no pain. Denies any drainage or pus. She's not been putting any ointment on the wound or covering the wound.  Denies any systemic complaints of fevers, chills, nausea, vomiting. Denies any calf pain, chest pain, shows of breath. No other complaints at this time.  Objective: AAO 3, NAD DP/PT pulses palpable, CRT less than 3 seconds Protective sensation decreased with Simms Weinstein monofilament On the plantar medial aspect of the hallux is a superficial wound measuring 0.1 x 0.1 cm after debridement with a hyperkeratotic periwound. The wound is almost a pinpoint type lesion at this time. There is no drainage or pus. There is no probing, undermining or tunneling. There is some faint erythema to the hallux however this is significantly improved. There is no areas of fluctuance or crepitance. Decrease edema to the toe. No other open lesions are present. Hallux malleus present. No other open lesions or pre-ulcer lesions identified elsewhere. No pain with calf compression, swelling, warmth, erythema.  Assessment: 50 year old female with reoccurring right hallux ulceration, cellulitis which is resolving.   Plan: -Treatment options discussed including all alternatives, risks, and complications -Wound was debrided today without complications. Silvadene was applied followed by dressing. Continued with daily dressing changes. -She did not get the MRI she was doing significantly better. -Continue offloading. -Monitor for any clinical signs or symptoms of infection and directed to call the office immediately should any occur or go to the ER. -Follow-up as  scheduled or sooner if any problems arise. In the meantime, encouraged to call the office with any questions, concerns, change in symptoms.   Celesta Gentile, DPM

## 2015-09-11 ENCOUNTER — Encounter: Payer: Self-pay | Admitting: Gastroenterology

## 2015-09-11 ENCOUNTER — Ambulatory Visit (INDEPENDENT_AMBULATORY_CARE_PROVIDER_SITE_OTHER): Payer: Medicare Other | Admitting: Gastroenterology

## 2015-09-11 VITALS — BP 122/80 | HR 68 | Ht 64.0 in | Wt 216.0 lb

## 2015-09-11 DIAGNOSIS — K219 Gastro-esophageal reflux disease without esophagitis: Secondary | ICD-10-CM | POA: Diagnosis not present

## 2015-09-11 DIAGNOSIS — R1310 Dysphagia, unspecified: Secondary | ICD-10-CM | POA: Diagnosis not present

## 2015-09-11 DIAGNOSIS — G894 Chronic pain syndrome: Secondary | ICD-10-CM

## 2015-09-11 DIAGNOSIS — G8929 Other chronic pain: Secondary | ICD-10-CM

## 2015-09-11 DIAGNOSIS — R1013 Epigastric pain: Secondary | ICD-10-CM

## 2015-09-11 NOTE — Patient Instructions (Signed)
You have been scheduled for an endoscopy. Please follow written instructions given to you at your visit today. If you use inhalers (even only as needed), please bring them with you on the day of your procedure. Your physician has requested that you go to www.startemmi.[email or web address] and enter the access code given to you at your visit today. This web site gives a general overview about your procedure. However, you should still follow specific instructions given to you by our office regarding your preparation for the procedure.  If you are age 102 or older, your body mass index should be between 23-30. Your Body mass index is 37.06 kg/(m^2). If this is out of the aforementioned range listed, please consider follow up with your Primary Care Provider.  If you are age 5 or younger, your body mass index should be between 19-25. Your Body mass index is 37.06 kg/(m^2). If this is out of the aformentioned range listed, please consider follow up with your Primary Care Provider.   Thank you for choosing [facility]  Dr Wilfrid Lund III

## 2015-09-11 NOTE — Progress Notes (Signed)
[facility] Consult Note:  History: [name] [name] 09/11/2015  Referring physician: Odette Fraction, MD  Reason for consult/chief complaint: Abdominal Pain; Gastroesophageal Reflux; and Constipation   Subjective HPI:  This woman reports many years of constipation which she attributes to her use of chronic narcotics.  For over a year she has also had chronic post-prandial epigastric pain, regurgitation and solid food dysphagia. She denies odynophagia or weight loss.  Previously took diclofenac, but not for the last 6 months. She had a normal EGD for anemia in 2007 with Dr Deatra Ina ROS:  Review of Systems  Constitutional: Negative for appetite change and unexpected weight change.  HENT: Negative for mouth sores and voice change.   Eyes: Negative for pain and redness.  Respiratory: Negative for cough and shortness of breath.   Cardiovascular: Negative for chest pain and palpitations.  Genitourinary: Negative for dysuria and hematuria.  Musculoskeletal: Positive for back pain. Negative for myalgias and arthralgias.  Skin: Negative for pallor and rash.  Neurological: Negative for weakness and headaches.  Hematological: Negative for adenopathy.  Psychiatric/Behavioral: Positive for sleep disturbance and dysphoric mood. The patient is nervous/anxious.      Past Medical History: Past Medical History  Diagnosis Date  . Hypertension   . Asthma   . Sleep apnea   . Anxiety   . Depression   . Bipolar 1 disorder ([place])     ect treatments last treatment Sep 02 1011  . GERD (gastroesophageal reflux disease)   . Seizures ([place])     last seizure was so long ago she can't remember.      Past Surgical History: Past Surgical History  Procedure Laterality Date  . Abdominal hysterectomy    . Back surgery    . Laproscopic knee surgery       Family History: Family History  Problem Relation Age of Onset  . Diabetes Mother   . COPD Mother   . Hypertension Mother   .  Hyperlipidemia Mother   . Heart disease Father   . Hyperlipidemia Father   . Hypertension Father   . Heart disease Brother   . Heart disease Daughter     Social History: Social History   Social History  . Marital Status: Legally Separated    Spouse Name: N/A  . Number of Children: N/A  . Years of Education: N/A   Social History Main Topics  . Smoking status: Never Smoker   . Smokeless tobacco: None  . Alcohol Use: No  . Drug Use: No  . Sexual Activity: No   Other Topics Concern  . None   Social History Narrative    Allergies: Allergies  Allergen Reactions  . Tetracyclines & Related Other (See Comments)    syncope  . Tramadol Other (See Comments)    seizures  . Ciprofloxacin Rash  . Codeine Itching  . Morphine And Related Other (See Comments)    Memory loss  . Penicillins Hives and Rash    Outpatient Meds: Current Outpatient Prescriptions  Medication Sig Dispense Refill  . AMITIZA 24 MCG capsule TAKE 1 CAPSULE TWICE A DAY WITH A MEAL 60 capsule 5  . benztropine (COGENTIN) 1 MG tablet Take 1 mg by mouth 2 (two) times daily.     . Calcium Carbonate-Vit D-Min (CALCIUM 1200 PO) Take 1 tablet by mouth daily.    . citalopram (CELEXA) 40 MG tablet Take 40 mg by mouth at bedtime.     . cyclobenzaprine (FLEXERIL) 10 MG tablet Take 10 mg by  mouth 3 (three) times daily as needed for muscle spasms.    . diclofenac (VOLTAREN) 75 MG EC tablet Take 75 mg by mouth 2 (two) times daily.     Marland Kitchen docusate sodium (COLACE) 100 MG capsule Take 100 mg by mouth daily.    . fluticasone (FLONASE) 50 MCG/ACT nasal spray Place 2 sprays into both nostrils daily. 1.5 g 2  . gabapentin (NEURONTIN) 600 MG tablet Take 1 tablet (600 mg total) by mouth 3 (three) times daily. For anxiety and pain management. May have to stop to take ECT. 90 tablet 0  . haloperidol (HALDOL) 5 MG tablet Take 1 tablet (5 mg total) by mouth 2 (two) times daily. For psychosis 60 tablet 0  . hydrochlorothiazide  (HYDRODIURIL) 25 MG tablet Take 1 tablet (25 mg total) by mouth daily. 90 tablet 2  . HYDROcodone-acetaminophen (NORCO) 10-325 MG per tablet Take 1 tablet by mouth every 8 (eight) hours.     . hydrOXYzine (ATARAX/VISTARIL) 10 MG tablet Take 10 mg by mouth 3 (three) times daily.     . Multiple Vitamins-Minerals (MULTIVITAMIN WITH MINERALS) tablet Take 1 tablet by mouth daily.    . nitroGLYCERIN (NITROSTAT) 0.4 MG SL tablet Place 1 tablet (0.4 mg total) under the tongue every 5 (five) minutes as needed for chest pain. 30 tablet 0  . ondansetron (ZOFRAN) 8 MG tablet Take 8 mg by mouth every 8 (eight) hours as needed. for nausea  0  . oxymorphone (OPANA) 10 MG tablet Take 10 mg by mouth 2 (two) times daily as needed for pain.    . pantoprazole (PROTONIX) 40 MG tablet TAKE 1 TABLET BY MOUTH TWICE A DAY 180 tablet 2  . prazosin (MINIPRESS) 5 MG capsule Take 5 mg by mouth at bedtime.    Marland Kitchen PROAIR HFA 108 (90 BASE) MCG/ACT inhaler INHALE 2 PUFFS INTO THE LUNGS EVERY 4 HOURS AS NEEDED FOR WHEEZING 8.5 Inhaler 3  . Probiotic Product (RESTORA PO) Take 1 capsule by mouth at bedtime.     . promethazine (PHENERGAN) 12.5 MG tablet Take 1 tablet (12.5 mg total) by mouth every 8 (eight) hours as needed for nausea or vomiting. 20 tablet 0  . SUMAtriptan (IMITREX) 100 MG tablet TAKE 1 TABLET (100 MG TOTAL) BY MOUTH EVERY 2 (TWO) HOURS AS NEEDED. MIGRANES 10 tablet 2  . traZODone (DESYREL) 100 MG tablet Take 100 mg by mouth at bedtime.  0  . VOLTAREN 1 % GEL Apply topically daily.    . [DISCONTINUED] lithium carbonate (LITHOBID) 300 MG CR tablet Take 1 tablet (300 mg total) by mouth every 12 (twelve) hours. For mood swings and depression 60 tablet 0   No current facility-administered medications for this visit.      ___________________________________________________________________ Objective  Exam:  BP 122/80 mmHg  Pulse 68  Ht 5\' 4"  (1.626 m)  Wt 216 lb (97.977 kg)  BMI 37.06 kg/m2   General: this is  a(n) chronically ill-appearing woman with a restricted affect   Eyes: sclera anicteric, no redness  ENT: oral mucosa moist without lesions, no cervical or supraclavicular lymphadenopathy, good dentition  CV: RRR without murmur, S1/S2, no JVD, no peripheral edema  Resp: clear to auscultation bilaterally, normal RR and effort noted  GI: obese, soft, + epigastric tenderness, with active bowel sounds. No guarding or palpable organomegaly noted.(limited by body habitus)  Skin; warm and dry, no rash or jaundice noted  Neuro: awake, alert and oriented x 3. Normal gross motor function and fluent speech  Labs: CBC    Component Value Date/Time   WBC 6.0 07/28/2015 1510   RBC 3.92 07/28/2015 1510   HGB 12.2 07/28/2015 1510   HCT 36.8 07/28/2015 1510   PLT 310 07/28/2015 1510   MCV 93.9 07/28/2015 1510   MCH 31.1 07/28/2015 1510   MCHC 33.2 07/28/2015 1510   RDW 13.7 07/28/2015 1510   LYMPHSABS 2460 07/28/2015 1510   MONOABS 300 07/28/2015 1510   EOSABS 180 07/28/2015 1510   BASOSABS 0 07/28/2015 1510     CMP Latest Ref Rng 07/28/2015 01/18/2015 10/27/2014  Glucose 65 - 99 mg/dL 118(H) 109(H) 102(H)  BUN 7 - 25 mg/dL 10 11 <5(L)  Creatinine 0.50 - 1.05 mg/dL 0.63 0.78 0.64  Sodium 135 - 146 mmol/L 138 135 140  Potassium 3.5 - 5.3 mmol/L 3.6 3.7 4.0  Chloride 98 - 110 mmol/L 101 98(L) 102  CO2 20 - 31 mmol/L 28 26 30   Calcium 8.6 - 10.4 mg/dL 9.2 9.5 9.3  Total Protein 6.5 - 8.1 g/dL - 7.1 -  Total Bilirubin 0.3 - 1.2 mg/dL - 0.4 -  Alkaline Phos 38 - 126 U/L - 65 -  AST 15 - 41 U/L - 23 -  ALT 14 - 54 U/L - 17 -    Unremarkable CT abdomen 01/2015, done for nausea and abd pain  Assessment: Encounter Diagnoses  Name Primary?  . Abdominal pain, chronic, epigastric Yes  . Dysphagia   . Gastroesophageal reflux disease, esophagitis presence not specified   . Chronic pain syndrome     Suspect much of this is related to chronic pain syndrome/chronic narcotic use causing  dysmotility. Does not sound typical for biliary colic.  Possibly ulcer, but now off NSAIDs  Plan:  EGD  The benefits and risks of the planned procedure were described in detail with the patient or (when appropriate) their health care proxy.  Risks were outlined as including, but not limited to, bleeding, infection, perforation, adverse medication reaction leading to cardiac or pulmonary decompensation, or pancreatitis (if ERCP).  The limitation of incomplete mucosal visualization was also discussed.  No guarantees or warranties were given.   Thank you for the courtesy of this consult.  Please call me with any questions or concerns.  Nelida Meuse III  CC: Odette Fraction, MD

## 2015-09-19 ENCOUNTER — Encounter: Payer: Self-pay | Admitting: Gastroenterology

## 2015-09-19 ENCOUNTER — Encounter: Payer: Medicare Other | Admitting: Family Medicine

## 2015-09-19 ENCOUNTER — Ambulatory Visit (AMBULATORY_SURGERY_CENTER): Payer: Medicare Other | Admitting: Gastroenterology

## 2015-09-19 VITALS — BP 111/76 | HR 74 | Temp 98.6°F | Resp 12 | Ht 64.0 in | Wt 216.0 lb

## 2015-09-19 DIAGNOSIS — R1013 Epigastric pain: Secondary | ICD-10-CM

## 2015-09-19 MED ORDER — SODIUM CHLORIDE 0.9 % IV SOLN: 500.0000 mL | INTRAVENOUS | Status: DC

## 2015-09-19 NOTE — Op Note (Signed)
[facility] Patient Name: [name] [name] Procedure Date: 09/19/2015 10:01 AM MRN: [id number] Endoscopist: Gren[place]. Loletha Carrow , MD Age: 50 Referring MD:  Date of Birth: 09/14/1965 Gender: Female Procedure:                Upper GI endoscopy Indications:              Epigastric abdominal pain, Suspected                            gastro-esophageal reflux disease Medicines:                Monitored Anesthesia Care Procedure:                Pre-Anesthesia Assessment:                           - Prior to the procedure, a History and Physical                            was performed, and patient medications and                            allergies were reviewed. The patient's tolerance of                            previous anesthesia was also reviewed. The risks                            and benefits of the procedure and the sedation                            options and risks were discussed with the patient.                            All questions were answered, and informed consent                            was obtained. Prior Anticoagulants: The patient has                            taken no previous anticoagulant or antiplatelet                            agents. ASA Grade Assessment: III - A patient with                            severe systemic disease. After reviewing the risks                            and benefits, the patient was deemed in                            satisfactory condition to undergo the procedure.  After obtaining informed consent, the endoscope was                            passed under direct vision. Throughout the                            procedure, the patient's blood pressure, pulse, and                            oxygen saturations were monitored continuously. The                            Model GIF-[id number]) scope was introduced                            through the mouth, and advanced to the second part                            of duodenum. The upper GI endoscopy was                            accomplished without difficulty. The patient                            tolerated the procedure well. Scope In: Scope Out: Findings:                 The esophagus was normal.                           The stomach was normal.                           The cardia and gastric fundus were normal on                            retroflexion.                           The examined duodenum was normal.                           The larynx was normal. Complications:            No immediate complications. Estimated Blood Loss:     Estimated blood loss: none. Impression:               - Normal esophagus.                           - Normal stomach.                           - Normal examined duodenum.                           - Normal larynx.                           -  No specimens collected.                           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 Recommendation:           - Patient has a contact number available for                            emergencies. The signs and symptoms of potential                            delayed complications were discussed with the                            patient. Return to normal activities tomorrow.                            Written discharge instructions were provided to the                            patient.                           - Resume previous diet.                           - Continue present medications. Mohammad Granade L. Loletha Carrow, MD 09/19/2015 10:23:22 AM This report has been signed electronically.

## 2015-09-19 NOTE — Patient Instructions (Signed)
YOU HAD AN ENDOSCOPIC PROCEDURE TODAY AT [facility]:   Refer to the procedure report that was given to you for any specific questions about what was found during the examination.  If the procedure report does not answer your questions, please call your gastroenterologist to clarify.  If you requested that your care partner not be given the details of your procedure findings, then the procedure report has been included in a sealed envelope for you to review at your convenience later.  YOU SHOULD EXPECT: Some feelings of bloating in the abdomen. Passage of more gas than usual.  Walking can help get rid of the air that was put into your GI tract during the procedure and reduce the bloating.  Please Note:  You might notice some irritation and congestion in your nose or some drainage.  This is from the oxygen used during your procedure.  There is no need for concern and it should clear up in a day or so.  SYMPTOMS TO REPORT IMMEDIATELY:   Following upper endoscopy (EGD)  Vomiting of blood or coffee ground material  New chest pain or pain under the shoulder blades  Painful or persistently difficult swallowing  New shortness of breath  Fever of 100F or higher  Black, tarry-looking stools  For urgent or emergent issues, a gastroenterologist can be reached at any hour by calling [phone].   DIET: Your first meal following the procedure should be a small meal and then it is ok to progress to your normal diet. Heavy or fried foods are harder to digest and may make you feel nauseous or bloated.  Likewise, meals heavy in dairy and vegetables can increase bloating.  Drink plenty of fluids but you should avoid alcoholic beverages for 24 hours.  ACTIVITY:  You should plan to take it easy for the rest of today and you should NOT DRIVE or use heavy machinery until tomorrow (because of the sedation medicines used during the test).    FOLLOW UP: Our staff will call the number listed on  your records the next business day following your procedure to check on you and address any questions or concerns that you may have regarding the information given to you following your procedure. If we do not reach you, we will leave a message.  However, if you are feeling well and you are not experiencing any problems, there is no need to return our call.  We will assume that you have returned to your regular daily activities without incident.  SIGNATURES/CONFIDENTIALITY: You and/or your care partner have signed paperwork which will be entered into your electronic medical record.  These signatures attest to the fact that that the information above on your After Visit Summary has been reviewed and is understood.  Full responsibility of the confidentiality of this discharge information lies with you and/or your care-partner.  Please continue your normal medications

## 2015-09-19 NOTE — Progress Notes (Signed)
A and O x3. Report to RN. Tolerated MAC anesthesia well.Teeth unchanged after procedure. 

## 2015-09-22 ENCOUNTER — Telehealth: Payer: Self-pay | Admitting: *Deleted

## 2015-09-22 DIAGNOSIS — Z6837 Body mass index (BMI) 37.0-37.9, adult: Secondary | ICD-10-CM | POA: Diagnosis not present

## 2015-09-22 DIAGNOSIS — Z01419 Encounter for gynecological examination (general) (routine) without abnormal findings: Secondary | ICD-10-CM | POA: Diagnosis not present

## 2015-09-22 NOTE — Telephone Encounter (Signed)
  Follow up Call-  Call back number 09/19/2015  Post procedure Call Back phone  # [phone]  Permission to leave phone message Yes     Patient questions:  Message left to call us if necessary.

## 2015-09-25 ENCOUNTER — Ambulatory Visit (INDEPENDENT_AMBULATORY_CARE_PROVIDER_SITE_OTHER): Payer: Medicare Other | Admitting: Family Medicine

## 2015-09-25 ENCOUNTER — Ambulatory Visit
Admission: RE | Admit: 2015-09-25 | Discharge: 2015-09-25 | Disposition: A | Discharge status: Home / Self Care | Payer: Medicare Other | Source: Ambulatory Visit

## 2015-09-25 ENCOUNTER — Other Ambulatory Visit: Payer: Self-pay | Admitting: Family Medicine

## 2015-09-25 VITALS — BP 118/84 | HR 95 | Temp 99.1°F | Resp 14 | Ht 64.0 in | Wt 213.0 lb

## 2015-09-25 DIAGNOSIS — E785 Hyperlipidemia, unspecified: Secondary | ICD-10-CM | POA: Diagnosis not present

## 2015-09-25 DIAGNOSIS — Z1231 Encounter for screening mammogram for malignant neoplasm of breast: Secondary | ICD-10-CM

## 2015-09-25 DIAGNOSIS — Z0000 Encounter for general adult medical examination without abnormal findings: Secondary | ICD-10-CM | POA: Diagnosis not present

## 2015-09-25 LAB — LIPID PANEL
Cholesterol: 208 mg/dL — ABNORMAL HIGH (ref 125–200)
HDL: 49 mg/dL
LDL Cholesterol: 115 mg/dL
Total CHOL/HDL Ratio: 4.2 ratio
Triglycerides: 218 mg/dL — ABNORMAL HIGH
VLDL: 44 mg/dL — ABNORMAL HIGH

## 2015-09-25 LAB — COMPLETE METABOLIC PANEL WITH GFR
ALT: 16 U/L (ref 6–29)
AST: 16 U/L (ref 10–35)
Albumin: 3.8 g/dL (ref 3.6–5.1)
Alkaline Phosphatase: 102 U/L (ref 33–130)
BUN: 10 mg/dL (ref 7–25)
CO2: 27 mmol/L (ref 20–31)
Calcium: 9.3 mg/dL (ref 8.6–10.4)
Chloride: 102 mmol/L (ref 98–110)
Creat: 0.65 mg/dL (ref 0.50–1.05)
GFR, Est African American: >89 mL/min (ref >=60)
GFR, Est Non African American: >89 mL/min (ref >=60)
Glucose, Bld: 87 mg/dL (ref 70–99)
Potassium: 4.2 mmol/L (ref 3.5–5.3)
Sodium: 140 mmol/L (ref 135–146)
Total Bilirubin: 0.3 mg/dL (ref 0.2–1.2)
Total Protein: 7.1 g/dL (ref 6.1–8.1)

## 2015-09-25 NOTE — Progress Notes (Signed)
Subjective:    Patient ID: [name] [name], female    DOB: 1965-06-26, 50 y.o.   MRN: [id number]  HPI    Review of Systems     Objective:   Physical Exam  Constitutional: She is oriented to person, place, and time. She appears well-developed and well-nourished. No distress.  HENT:  Head: Normocephalic and atraumatic.  Right Ear: External ear normal.  Left Ear: External ear normal.  Nose: Nose normal.  Mouth/Throat: Oropharynx is clear and moist. No oropharyngeal exudate.  Eyes: Conjunctivae and EOM are normal. Pupils are equal, round, and reactive to light. Right eye exhibits no discharge. Left eye exhibits no discharge. No scleral icterus.  Neck: Normal range of motion. Neck supple. No JVD present. No tracheal deviation present. No thyromegaly present.  Cardiovascular: Normal rate, regular rhythm, normal heart sounds and intact distal pulses.  Exam reveals no gallop and no friction rub.   No murmur heard. Pulmonary/Chest: Effort normal and breath sounds normal. No stridor. No respiratory distress. She has no wheezes. She has no rales. She exhibits no tenderness.  Abdominal: Soft. Bowel sounds are normal. She exhibits no distension and no mass. There is no tenderness. There is no rebound and no guarding.  Musculoskeletal: Normal range of motion. She exhibits no edema or tenderness.  Lymphadenopathy:    She has no cervical adenopathy.  Neurological: She is alert and oriented to person, place, and time. She has normal reflexes. She displays normal reflexes. No cranial nerve deficit. She exhibits normal muscle tone. Coordination normal.  Skin: Skin is warm. No rash noted. She is not diaphoretic. No erythema. No pallor.  Psychiatric: She has a normal mood and affect. Her behavior is normal. Judgment and thought content normal.  Vitals reviewed.         Assessment & Plan:     Subjective:    Patient ID: [name] [name], female    DOB: January 29, 1966, 50 y.o.   MRN:  [id number]  HPI  patient is a 50 year oldwhitefemale here today for complete physical exam.  She reportedly had a colonoscopy in 2013 and is not due again until 2023. She has a mammogram scheduled for this afternoon. She has a bone density test scheduled for later this month. Her Pap smear was just performed by her gynecologist. She is due for a tetanus shot. Past Medical History  Diagnosis Date  . Hypertension   . Asthma   . Sleep apnea   . Anxiety   . Depression   . Bipolar 1 disorder ([place])     ect treatments last treatment Sep 02 1011  . GERD (gastroesophageal reflux disease)   . Seizures ([place])     last seizure was so long ago she can't remember.    Past Surgical History  Procedure Laterality Date  . Abdominal hysterectomy    . Back surgery    . Laproscopic knee surgery     Current Outpatient Prescriptions on File Prior to Visit  Medication Sig Dispense Refill  . AMITIZA 24 MCG capsule TAKE 1 CAPSULE TWICE A DAY WITH A MEAL 60 capsule 5  . benztropine (COGENTIN) 1 MG tablet Take 1 mg by mouth 2 (two) times daily.     . Calcium Carbonate-Vit D-Min (CALCIUM 1200 PO) Take 1 tablet by mouth daily.    . citalopram (CELEXA) 40 MG tablet Take 40 mg by mouth at bedtime.     . cyclobenzaprine (FLEXERIL) 10 MG tablet Take 10 mg by mouth 3 (three) times daily as  needed for muscle spasms.    . fluticasone (FLONASE) 50 MCG/ACT nasal spray Place 2 sprays into both nostrils daily. 1.5 g 2  . gabapentin (NEURONTIN) 600 MG tablet Take 1 tablet (600 mg total) by mouth 3 (three) times daily. For anxiety and pain management. May have to stop to take ECT. 90 tablet 0  . haloperidol (HALDOL) 5 MG tablet Take 1 tablet (5 mg total) by mouth 2 (two) times daily. For psychosis (Patient taking differently: Take 2.5 mg by mouth at bedtime. For psychosis) 60 tablet 0  . hydrochlorothiazide (HYDRODIURIL) 25 MG tablet Take 1 tablet (25 mg total) by mouth daily. 90 tablet 2  . HYDROcodone-acetaminophen (NORCO)  10-325 MG per tablet Take 1 tablet by mouth every 8 (eight) hours.     . hydrOXYzine (ATARAX/VISTARIL) 10 MG tablet Take 10 mg by mouth 3 (three) times daily.     . Multiple Vitamins-Minerals (MULTIVITAMIN WITH MINERALS) tablet Take 1 tablet by mouth daily.    . nitroGLYCERIN (NITROSTAT) 0.4 MG SL tablet Place 1 tablet (0.4 mg total) under the tongue every 5 (five) minutes as needed for chest pain. 30 tablet 0  . oxymorphone (OPANA) 10 MG tablet Take 10 mg by mouth 2 (two) times daily as needed for pain.    . pantoprazole (PROTONIX) 40 MG tablet TAKE 1 TABLET BY MOUTH TWICE A DAY 180 tablet 2  . prazosin (MINIPRESS) 5 MG capsule Take 5 mg by mouth at bedtime.    Marland Kitchen PROAIR HFA 108 (90 BASE) MCG/ACT inhaler INHALE 2 PUFFS INTO THE LUNGS EVERY 4 HOURS AS NEEDED FOR WHEEZING 8.5 Inhaler 3  . promethazine (PHENERGAN) 12.5 MG tablet Take 1 tablet (12.5 mg total) by mouth every 8 (eight) hours as needed for nausea or vomiting. 20 tablet 0  . SUMAtriptan (IMITREX) 100 MG tablet TAKE 1 TABLET (100 MG TOTAL) BY MOUTH EVERY 2 (TWO) HOURS AS NEEDED. MIGRANES 10 tablet 2  . traZODone (DESYREL) 100 MG tablet Take 100 mg by mouth at bedtime.  0  . VOLTAREN 1 % GEL Apply topically daily.    . [DISCONTINUED] lithium carbonate (LITHOBID) 300 MG CR tablet Take 1 tablet (300 mg total) by mouth every 12 (twelve) hours. For mood swings and depression 60 tablet 0   No current facility-administered medications on file prior to visit.   Allergies  Allergen Reactions  . Tetracyclines & Related Other (See Comments)    syncope  . Tramadol Other (See Comments)    seizures  . Ciprofloxacin Rash  . Codeine Itching  . Morphine And Related Other (See Comments)    Memory loss  . Penicillins Hives and Rash   Social History   Social History  . Marital Status: Legally Separated    Spouse Name: N/A  . Number of Children: N/A  . Years of Education: N/A   Occupational History  . Not on file.   Social History Main  Topics  . Smoking status: Never Smoker   . Smokeless tobacco: Not on file  . Alcohol Use: No  . Drug Use: No  . Sexual Activity: No   Other Topics Concern  . Not on file   Social History Narrative   Family History  Problem Relation Age of Onset  . Diabetes Mother   . COPD Mother   . Hypertension Mother   . Hyperlipidemia Mother   . Heart disease Father   . Hyperlipidemia Father   . Hypertension Father   . Heart disease Brother   .  Heart disease Daughter       Review of Systems  All other systems reviewed and are negative.      Objective:   Physical Exam  Constitutional: She is oriented to person, place, and time. She appears well-developed and well-nourished. No distress.  HENT:  Head: Normocephalic and atraumatic.  Right Ear: External ear normal.  Left Ear: External ear normal.  Nose: Nose normal.  Mouth/Throat: Oropharynx is clear and moist. No oropharyngeal exudate.  Eyes: Conjunctivae and EOM are normal. Pupils are equal, round, and reactive to light. Right eye exhibits no discharge. Left eye exhibits no discharge. No scleral icterus.  Neck: Normal range of motion. Neck supple. No JVD present. No tracheal deviation present. No thyromegaly present.  Cardiovascular: Normal rate, regular rhythm, normal heart sounds and intact distal pulses.  Exam reveals no gallop and no friction rub.   No murmur heard. Pulmonary/Chest: Effort normal and breath sounds normal. No stridor. No respiratory distress. She has no wheezes. She has no rales. She exhibits no tenderness.  Abdominal: Soft. Bowel sounds are normal. She exhibits no distension and no mass. There is no tenderness. There is no rebound and no guarding.  Musculoskeletal: Normal range of motion. She exhibits no edema or tenderness.  Lymphadenopathy:    She has no cervical adenopathy.  Neurological: She is alert and oriented to person, place, and time. She has normal reflexes. She displays normal reflexes. No cranial  nerve deficit. She exhibits normal muscle tone. Coordination normal.  Skin: Skin is warm. No rash noted. She is not diaphoretic. No erythema. No pallor.  Psychiatric: She has a normal mood and affect. Her behavior is normal. Judgment and thought content normal.  Vitals reviewed.         Assessment & Plan:  Routine general medical examination at a health care facility - Plan: COMPLETE METABOLIC PANEL WITH GFR, Lipid panel, Hepatitis C Ab Reflex HCV RNA, QUANT, HIV antibody  Patient has morbid obesity.  I strongly encouraged her to start exercising 30 min a day 5 days a week.  Cancer screening is up-to-date including a colonoscopy, mammogram, and Pap smear. She will have a bone density performed later this month. I will screen the patient for hepatitis C and HIV to complete her preventative care. I will also check a CMP with fasting lipid panel

## 2015-09-26 ENCOUNTER — Telehealth: Payer: Self-pay

## 2015-09-26 LAB — HIV ANTIBODY (ROUTINE TESTING W REFLEX): HIV 1&2 Ab, 4th Generation: NONREACTIVE

## 2015-09-26 LAB — HEPATITIS C ANTIBODY: HCV Ab: NEGATIVE

## 2015-09-26 NOTE — Telephone Encounter (Signed)
Patient aware of results and recommendations. °

## 2015-09-26 NOTE — Telephone Encounter (Signed)
-----   Message from [name], MD sent at 09/26/2015  7:04 AM EDT ----- hiv and hep c are negative.  Cholesterol is ok.

## 2015-09-26 NOTE — Telephone Encounter (Signed)
Left message for patient to call office regarding lab results and recommendations. 

## 2015-10-01 ENCOUNTER — Telehealth: Payer: Self-pay | Admitting: Family Medicine

## 2015-10-01 NOTE — Telephone Encounter (Signed)
Pt is having a dental procedure on 6/19 and is requesting that Dr. Dennard Schaumann call in a prescription of Valium to ease her anxiety.  [facility]

## 2015-10-02 MED ORDER — DIAZEPAM 5 MG PO TABS: ORAL_TABLET | ORAL | Status: DC

## 2015-10-02 NOTE — Telephone Encounter (Signed)
Medication called/sent to requested pharmacy  

## 2015-10-02 NOTE — Telephone Encounter (Signed)
Valium 5 mg po 30 min prior to appt.

## 2015-10-03 ENCOUNTER — Ambulatory Visit (INDEPENDENT_AMBULATORY_CARE_PROVIDER_SITE_OTHER): Payer: Medicare Other

## 2015-10-03 ENCOUNTER — Ambulatory Visit (INDEPENDENT_AMBULATORY_CARE_PROVIDER_SITE_OTHER): Payer: Medicare Other | Admitting: Podiatry

## 2015-10-03 ENCOUNTER — Telehealth: Payer: Self-pay | Admitting: *Deleted

## 2015-10-03 ENCOUNTER — Encounter: Payer: Self-pay | Admitting: Podiatry

## 2015-10-03 VITALS — BP 133/79 | HR 96 | Temp 98.4°F | Resp 18

## 2015-10-03 DIAGNOSIS — L97519 Non-pressure chronic ulcer of other part of right foot with unspecified severity: Secondary | ICD-10-CM | POA: Diagnosis not present

## 2015-10-03 DIAGNOSIS — L03031 Cellulitis of right toe: Secondary | ICD-10-CM

## 2015-10-03 DIAGNOSIS — R52 Pain, unspecified: Secondary | ICD-10-CM

## 2015-10-03 MED ORDER — SULFAMETHOXAZOLE-TRIMETHOPRIM 800-160 MG PO TABS: 1.0000 | ORAL_TABLET | Freq: Two times a day (BID) | ORAL | Status: DC

## 2015-10-03 NOTE — Patient Instructions (Signed)
Monitor for any signs/symptoms of infection. Call the office immediately if any occur or go directly to the emergency room. Call with any questions/concerns.  

## 2015-10-03 NOTE — Telephone Encounter (Addendum)
-----   Message from [name], DPM sent at 10/03/2015  2:21 PM EDT ----- Can you order or an MRI of her right foot to rule out OM. [facility] to [facility] [employer]. [employer] MEDICARE DOES NOT REQUIRE PRIOR AUTHORIZATION FOR MRI 52841. FAXED TO [place]. 10/16/2015-Left message on pt's home phone to call for results for MRI. Left message on pt's mobile phone to call for results.

## 2015-10-07 ENCOUNTER — Other Ambulatory Visit: Payer: Self-pay | Admitting: Family Medicine

## 2015-10-07 NOTE — Telephone Encounter (Signed)
Refill appropriate and filled per protocol. 

## 2015-10-07 NOTE — Progress Notes (Signed)
Patient ID: [name] [name], female   DOB: Apr 29, 1965, 50 y.o.   MRN: [id number]  Subjective: 50 year old female presents the office today for concerns of recurrent infection of the right big toe. She states that the infection started about a week ago and has been worsening. She has not been on antibiotics recently. Denies any recent injury or trauma. Denies any red streaks. Denies any drainage or pus. Denies any systemic complaints such as fevers, chills, nausea, vomiting. No acute changes since last appointment, and no other complaints at this time.   Objective: AAO x3, NAD DP/PT pulses palpable bilaterally, CRT less than 3 seconds The right hallux has edema and erythema to the level the MPJ without any ascending synovitis. There is an ulceration of plantar aspect of the hallux with granular. There is probing however does not probe to bone. There is no undermining or tunneling. There is no fluctuance or crepitus. There is no malodor. Hallux malleus present. No areas of pinpoint bony tenderness or pain with vibratory sensation. MMT 5/5, ROM WNL. No edema, erythema, increase in warmth to bilateral lower extremities.  No open lesions or pre-ulcerative lesions.  No pain with calf compression, swelling, warmth, erythema  Assessment: Right hallux reoccurring infections with ulceration  Plan: -All treatment options discussed with the patient including all alternatives, risks, complications.  -X-rays obtained and reviewed with the patient. No definitive evidence of cortical changes suggestive of osteomyelitis. -Hyperkeratotic lesions/wound on the plantar aspect of the hallux is debrided to bleeding or tissue. -At this point recommended MRI due to the reoccurring infections. This is ordered today. -Continue to dressing changes as discussed with antibiotic ointment for now. -Prescribed Bactrim. -Follow-up as scheduled or sooner if any issues are to arise. If any worsening signs or symptoms of  infection directed to the emergency room. -Patient encouraged to call the office with any questions, concerns, change in symptoms.   Celesta Gentile, DPM

## 2015-10-10 ENCOUNTER — Encounter (HOSPITAL_COMMUNITY): Payer: Self-pay | Admitting: Emergency Medicine

## 2015-10-10 ENCOUNTER — Ambulatory Visit (HOSPITAL_COMMUNITY)
Admission: EM | Admit: 2015-10-10 | Discharge: 2015-10-10 | Disposition: A | Discharge status: Home / Self Care | Payer: Medicare Other | Attending: Family Medicine | Admitting: Family Medicine

## 2015-10-10 DIAGNOSIS — S8391XA Sprain of unspecified site of right knee, initial encounter: Secondary | ICD-10-CM

## 2015-10-10 DIAGNOSIS — M25561 Pain in right knee: Secondary | ICD-10-CM | POA: Diagnosis not present

## 2015-10-10 NOTE — Discharge Instructions (Signed)
How to Use a Knee Brace A knee brace is a device that you wear to support your knee, especially if the knee is healing after an injury or surgery. There are several types of knee braces. Some are designed to prevent an injury (prophylactic brace). These are often worn during sports. Others support an injured knee (functional brace) or keep it still while it heals (rehabilitative brace). People with severe arthritis of the knee may benefit from a brace that takes some pressure off the knee (unloader brace). Most knee braces are made from a combination of cloth and metal or plastic.  You may need to wear a knee brace to:  Relieve knee pain.  Help your knee support your weight (improve stability).  Help you walk farther (improve mobility).  Prevent injury.  Support your knee while it heals from surgery or from an injury. RISKS AND COMPLICATIONS Generally, knee braces are very safe to wear. However, problems may occur, including:  Skin irritation that may lead to infection.  Making your condition worse if you wear the brace in the wrong way. HOW TO USE A KNEE BRACE Different braces will have different instructions for use. Your health care provider will tell you or show you:  How to put on your brace.  How to adjust the brace.  When and how often to wear the brace.  How to remove the brace.  If you will need any assistive devices in addition to the brace, such as crutches or a cane. In general, your brace should:  Have the hinge of the brace line up with the bend of your knee.  Have straps, hooks, or tapes that fasten snugly around your leg.  Not feel too tight or too loose. HOW TO CARE FOR A KNEE BRACE  Check your brace often for signs of damage, such as loose connections or attachments. Your knee brace may get damaged or wear out during normal use.  Wash the fabric parts of your brace with soap and water.  Read the insert that comes with your brace for other specific care  instructions. SEEK MEDICAL CARE IF:  Your knee brace is too loose or too tight and you cannot adjust it.  Your knee brace causes skin redness, swelling, bruising, or irritation.  Your knee brace is not helping.  Your knee brace is making your knee pain worse.   This information is not intended to replace advice given to you by your health care provider. Make sure you discuss any questions you have with your health care provider.   Document Released: 06/26/2003 Document Revised: 12/25/2014 Document Reviewed: 07/29/2014 Elsevier Interactive Patient Education 2016 [place]  Knee Sprain Likely injured tendons and/or ligaments in the posterior aspect of the knee. There is some swelling there that also resembles a Baker cyst. Whether this was pre-existing or not is unclear. Before you see Dr. Percell Miller apply ice and wear the knee immobilizer for support. Keep elevated. A knee sprain is a tear in the strong bands of tissue that connect the bones (ligaments) of your knee. HOME CARE  Raise (elevate) your injured knee to lessen puffiness (swelling).  To ease pain and puffiness, put ice on the injured area.  Put ice in a plastic bag.  Place a towel between your skin and the bag.  Leave the ice on for 20 minutes, 2-3 times a day.  Only take medicine as told by your doctor.  Do not leave your knee unprotected until pain and stiffness go away (  usually 4-6 weeks).  If you have a cast or splint, do not get it wet. If your doctor told you to not take it off, cover it with a plastic bag when you shower or bathe. Do not swim.  Your doctor may have you do exercises to prevent or limit permanent weakness and stiffness. GET HELP RIGHT AWAY IF:   Your cast or splint becomes damaged.  Your pain gets worse.  You have a lot of pain, puffiness, or numbness below the cast or splint. MAKE SURE YOU:   Understand these instructions.  Will watch your condition.  Will get help right away if  you are not doing well or get worse.   This information is not intended to replace advice given to you by your health care provider. Make sure you discuss any questions you have with your health care provider.   Document Released: 03/24/2009 Document Revised: 04/10/2013 Document Reviewed: 12/12/2012 Elsevier Interactive Patient Education [employer].

## 2015-10-10 NOTE — ED Provider Notes (Signed)
CSN: [id number]     Arrival date & time 10/10/15  1803 History   First MD Initiated Contact with Patient 10/10/15 1909     Chief Complaint  Patient presents with  . Knee Injury   (Consider location/radiation/quality/duration/timing/severity/associated sxs/prior Treatment) HPI Comments: 50 year old female states that 2 days ago she was putting objects in her car and bumped her right knee against a car however there was an additional type injury in which she describes a potential dislocation of the patella and a sliding of the knee against the bumper. The pain is primarily in the back of the knee. There is pain with ambulation and flexion. 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   Past Medical History  Diagnosis Date  . Hypertension   . Asthma   . Sleep apnea   . Anxiety   . Depression   . Bipolar 1 disorder ([place])     ect treatments last treatment Sep 02 1011  . GERD (gastroesophageal reflux disease)   . Seizures ([place])     last seizure was so long ago she can't remember.    Past Surgical History  Procedure Laterality Date  . Abdominal hysterectomy    . Back surgery    . Laproscopic knee surgery     Family History  Problem Relation Age of Onset  . Diabetes Mother   . COPD Mother   . Hypertension Mother   . Hyperlipidemia Mother   . Heart disease Father   . Hyperlipidemia Father   . Hypertension Father   . Heart disease Brother   . Heart disease Daughter    Social History  Substance Use Topics  . Smoking status: Never Smoker   . Smokeless tobacco: None  . Alcohol Use: No   OB History    No data available     Review of Systems  Constitutional: Negative for fever, chills and activity change.  HENT: Negative.   Respiratory: Negative.   Cardiovascular: Negative.   Musculoskeletal:       As per HPI  Skin: Negative for color change, pallor and rash.  Neurological: Negative.   All other systems  reviewed and are negative.   Allergies  Tetracyclines & related; Tramadol; Ciprofloxacin; Codeine; Morphine and related; and Penicillins  Home Medications   Prior to Admission medications   Medication Sig Start Date End Date Taking? Authorizing Provider  AMITIZA 24 MCG capsule TAKE 1 CAPSULE TWICE A DAY WITH A MEAL 10/07/15  Yes [name], MD  benztropine (COGENTIN) 1 MG tablet Take 1 mg by mouth 2 (two) times daily.    Yes Historical Provider, MD  Calcium Carbonate-Vit D-Min (CALCIUM 1200 PO) Take 1 tablet by mouth daily.   Yes Historical Provider, MD  citalopram (CELEXA) 40 MG tablet Take 40 mg by mouth at bedtime.  05/28/11  Yes Darrol Jump, MD  cyclobenzaprine (FLEXERIL) 10 MG tablet Take 10 mg by mouth 3 (three) times daily as needed for muscle spasms.   Yes Historical Provider, MD  fluticasone (FLONASE) 50 MCG/ACT nasal spray Place 2 sprays into both nostrils daily. 12/05/14  Yes [name], MD  gabapentin (NEURONTIN) 600 MG tablet Take 1 tablet (600 mg total) by mouth 3 (three) times daily. For anxiety and pain management. May have to stop to take ECT. 05/28/11  Yes Darrol Jump, MD  haloperidol (HALDOL) 5 MG tablet Take 1 tablet (5 mg total) by mouth 2 (  two) times daily. For psychosis Patient taking differently: Take 2.5 mg by mouth at bedtime. For psychosis 05/28/11  Yes Darrol Jump, MD  hydrochlorothiazide (HYDRODIURIL) 25 MG tablet TAKE 1 TABLET EVERY DAY 09/25/15  Yes [name], MD  HYDROcodone-acetaminophen [facility]) 10-325 MG per tablet Take 1 tablet by mouth every 8 (eight) hours.    Yes Historical Provider, MD  hydrOXYzine (ATARAX/VISTARIL) 10 MG tablet Take 10 mg by mouth 3 (three) times daily.  11/25/14  Yes Historical Provider, MD  Multiple Vitamins-Minerals (MULTIVITAMIN WITH MINERALS) tablet Take 1 tablet by mouth daily.   Yes Historical Provider, MD  oxymorphone (OPANA) 10 MG tablet Take 10 mg by mouth 2 (two) times daily as needed for pain.   Yes Historical  Provider, MD  pantoprazole (PROTONIX) 40 MG tablet TAKE 1 TABLET BY MOUTH TWICE A DAY 05/23/15  Yes [name], MD  prazosin (MINIPRESS) 5 MG capsule Take 5 mg by mouth at bedtime.   Yes Historical Provider, MD  sulfamethoxazole-trimethoprim (BACTRIM DS,SEPTRA DS) 800-160 MG tablet Take 1 tablet by mouth 2 (two) times daily. 10/03/15  Yes [name], DPM  traZODone (DESYREL) 100 MG tablet Take 100 mg by mouth at bedtime. 07/29/14  Yes Historical Provider, MD  VOLTAREN 1 % GEL Apply topically daily. 06/29/13  Yes Historical Provider, MD  diazepam (VALIUM) 5 MG tablet 1 tab po 30 minutes prior to procedure 10/02/15   [name], MD  nitroGLYCERIN (NITROSTAT) 0.4 MG SL tablet Place 1 tablet (0.4 mg total) under the tongue every 5 (five) minutes as needed for chest pain. 10/27/14   Leone Brand, MD  PROAIR HFA 108 [id number] BASE) MCG/ACT inhaler INHALE 2 PUFFS INTO THE LUNGS EVERY 4 HOURS AS NEEDED FOR WHEEZING 10/01/14   [name], MD  promethazine (PHENERGAN) 12.5 MG tablet Take 1 tablet (12.5 mg total) by mouth every 8 (eight) hours as needed for nausea or vomiting. 08/11/15   [name], DPM  SUMAtriptan (IMITREX) 100 MG tablet TAKE 1 TABLET (100 MG TOTAL) BY MOUTH EVERY 2 (TWO) HOURS AS NEEDED. MIGRANES 05/27/14   [name], MD   Meds Ordered and Administered this Visit  Medications - No data to display  BP 130/94 mmHg  Pulse 99  Temp(Src) 98.9 F (37.2 C) (Oral)  Resp 18  SpO2 95% No data found.   Physical Exam  Constitutional: She is oriented to person, place, and time. She appears well-developed and well-nourished. No distress.  HENT:  Head: Normocephalic.  Eyes: EOM are normal.  Neck: Normal range of motion. Neck supple.  Cardiovascular: Normal rate.   Pulmonary/Chest: Effort normal.  Musculoskeletal:  Inspection reveals overlying scar and previous surgery. Minimal puffiness over the medial lateral joint lines. Tenderness over the anterior knee. Patella  is in the midline. Patient is able to flex to 90 but with some discomfort in the posterior knee. Extension to 170 limited due to pain in the posterior knee. No obvious deformities. Examination of the left knee while patient supine: Patient is able to flex to 80 limited due to pain. When the leg is extended there is a puffiness in the popliteal fossa. There is tenderness across the posterior knee. No erythema.  Attempts to test for laxity is limited due to knee pain. Drawer test is negative. Internal and external rotation is negative. Attempts for varus and valgus or extremely limited due to knee pain. Distal neurovascular motor sensory is grossly intact.  Neurological: She is alert  and oriented to person, place, and time. No cranial nerve deficit.  Skin: Skin is warm and dry.  Psychiatric: She has a normal mood and affect.  Nursing note and vitals reviewed.   ED Course  Procedures (including critical care time)  Labs Review Labs Reviewed - No data to display  Imaging Review No results found.   Visual Acuity Review  Right Eye Distance:   Left Eye Distance:   Bilateral Distance:    Right Eye Near:   Left Eye Near:    Bilateral Near:         MDM   1. Knee pain, acute, right   2. Knee sprain, right, initial encounter    Knee immobilizer. Ice. Limit weightbearing. Call Dr. Percell Miller Monday morning for an appointment. Likely injured tendons and/or ligaments in the posterior aspect of the knee. There is some swelling there that also resembles a Baker cyst. Whether this was pre-existing or not is unclear. Before you see Dr. Percell Miller apply ice and wear the knee immobilizer for support. Keep elevated.     Janne Napoleon, NP 10/10/15 2048

## 2015-10-10 NOTE — ED Notes (Signed)
The patient presented to the [facility] with a complaint of a right knee injury that occurred 2 days prior. The patient stated that she was loading some items into a car and her knee hit the bumper pushing it backwards causing pain.

## 2015-10-13 ENCOUNTER — Telehealth: Payer: Self-pay | Admitting: Family Medicine

## 2015-10-13 ENCOUNTER — Ambulatory Visit: Payer: Medicare Other | Admitting: Podiatry

## 2015-10-13 DIAGNOSIS — M4325 Fusion of spine, thoracolumbar region: Secondary | ICD-10-CM | POA: Diagnosis not present

## 2015-10-13 DIAGNOSIS — S8991XA Unspecified injury of right lower leg, initial encounter: Secondary | ICD-10-CM

## 2015-10-13 DIAGNOSIS — G894 Chronic pain syndrome: Secondary | ICD-10-CM | POA: Diagnosis not present

## 2015-10-13 NOTE — Telephone Encounter (Signed)
Patient went to Urgent care for right knee pain. She would like a referral to [facility] they told patient they would like her to see them tomorrow. She was seen at [place].   CB# [phone]

## 2015-10-13 NOTE — Telephone Encounter (Signed)
Referral placed and notes faxed to [facility]

## 2015-10-15 ENCOUNTER — Other Ambulatory Visit: Payer: Medicare Other

## 2015-10-16 ENCOUNTER — Ambulatory Visit
Admission: RE | Admit: 2015-10-16 | Discharge: 2015-10-16 | Disposition: A | Discharge status: Home / Self Care | Payer: Medicare Other | Source: Ambulatory Visit | Attending: Podiatry | Admitting: Podiatry

## 2015-10-16 DIAGNOSIS — M25561 Pain in right knee: Secondary | ICD-10-CM | POA: Diagnosis not present

## 2015-10-16 DIAGNOSIS — R600 Localized edema: Secondary | ICD-10-CM | POA: Diagnosis not present

## 2015-10-16 DIAGNOSIS — R52 Pain, unspecified: Secondary | ICD-10-CM

## 2015-10-16 MED ORDER — GADOBENATE DIMEGLUMINE 529 MG/ML IV SOLN
20.0000 mL | Freq: Once | INTRAVENOUS | Status: AC | PRN
  Administered 2015-10-16: 20 mL via INTRAVENOUS

## 2015-10-16 NOTE — Telephone Encounter (Signed)
-----   Message from [name], DPM sent at 10/16/2015 12:48 PM EDT ----- Please let her know that her MRI shows some swelling in the bone. The radiologist said there is a small change it could be infection in the bone. Continue antibiotics for now. Will take repeat x-rays to watch the bone for any changes.

## 2015-10-23 ENCOUNTER — Telehealth: Payer: Self-pay

## 2015-10-23 ENCOUNTER — Other Ambulatory Visit: Payer: Self-pay

## 2015-10-23 MED ORDER — SULFAMETHOXAZOLE-TRIMETHOPRIM 800-160 MG PO TABS: 1.0000 | ORAL_TABLET | Freq: Two times a day (BID) | ORAL | Status: DC

## 2015-10-23 NOTE — Telephone Encounter (Signed)
LVM for pt to call back to discuss MRI results, and advised her to keep scheduled appt with Dr Jacqualyn Posey

## 2015-10-23 NOTE — Progress Notes (Signed)
Spoke with pt regarding MRI results, advised her of Dr Leigh Aurora reading and orders, Bactrim called into pharmacy, she is to continue with abts until her next evaluation with [facility] on 10/31/15

## 2015-10-30 DIAGNOSIS — M79641 Pain in right hand: Secondary | ICD-10-CM | POA: Diagnosis not present

## 2015-10-30 DIAGNOSIS — M25561 Pain in right knee: Secondary | ICD-10-CM | POA: Diagnosis not present

## 2015-10-31 ENCOUNTER — Ambulatory Visit: Payer: Medicare Other | Admitting: Family Medicine

## 2015-10-31 ENCOUNTER — Encounter: Payer: Self-pay | Admitting: Podiatry

## 2015-10-31 ENCOUNTER — Ambulatory Visit (INDEPENDENT_AMBULATORY_CARE_PROVIDER_SITE_OTHER): Payer: Medicare Other | Admitting: Podiatry

## 2015-10-31 VITALS — BP 134/82 | HR 77 | Resp 12

## 2015-10-31 DIAGNOSIS — L03031 Cellulitis of right toe: Secondary | ICD-10-CM | POA: Diagnosis not present

## 2015-10-31 DIAGNOSIS — L97519 Non-pressure chronic ulcer of other part of right foot with unspecified severity: Secondary | ICD-10-CM

## 2015-10-31 MED ORDER — SULFAMETHOXAZOLE-TRIMETHOPRIM 800-160 MG PO TABS: 1.0000 | ORAL_TABLET | Freq: Two times a day (BID) | ORAL | Status: DC

## 2015-11-11 DIAGNOSIS — M25561 Pain in right knee: Secondary | ICD-10-CM | POA: Diagnosis not present

## 2015-11-13 NOTE — Progress Notes (Signed)
Patient ID: [name] [name], female   DOB: 1966-01-04, 50 y.o.   MRN: [id number]  Subjective: 50 year old female presents the office today for follow-up evaluation of infection of the right big toe. She says the area has gotten significantly better compared to last appointment. Denies any drainage or pus. Decrease swelling to the toe. Denies any systemic complaints such as fevers, chills, nausea, vomiting. No acute changes since last appointment, and no other complaints at this time.   Objective: AAO x3, NAD DP/PT pulses palpable bilaterally, CRT less than 3 seconds The right hallux has resolving edema and erythema. There is no ascending synovitis. Ulceration of plantar aspect of the hallux appears to be almost healed at this point. There is no fluctuance or crepitus. There is no probing, undermining or tunneling. There is no malodor. Hallux nice is present. No areas of pinpoint bony tenderness or pain with vibratory sensation. MMT 5/5, ROM WNL. No edema, erythema, increase in warmth to bilateral lower extremities.  No open lesions or pre-ulcerative lesions.  No pain with calf compression, swelling, warmth, erythema  Assessment: Right hallux resolving infections with ulceration  Plan: -All treatment options discussed with the patient including all alternatives, risks, complications.  -MRI findings were discussed the patient. -Wound was debrided to granular tissue. -Continue Bactrim and case of early osteomyelitis and resolving infection -Offloading shoe -Follow-up as scheduled or sooner if any issues are to arise. If any worsening signs or symptoms of infection directed to the emergency room. -Patient encouraged to call the office with any questions, concerns, change in symptoms.   Celesta Gentile, DPM

## 2015-11-14 ENCOUNTER — Observation Stay (HOSPITAL_COMMUNITY)
Admission: EM | Admit: 2015-11-14 | Discharge: 2015-11-15 | Disposition: A | Discharge status: Home / Self Care | Payer: Medicare Other | Attending: Internal Medicine | Admitting: Internal Medicine

## 2015-11-14 ENCOUNTER — Emergency Department (HOSPITAL_COMMUNITY): Payer: Medicare Other

## 2015-11-14 ENCOUNTER — Encounter (HOSPITAL_COMMUNITY): Payer: Self-pay

## 2015-11-14 ENCOUNTER — Ambulatory Visit (INDEPENDENT_AMBULATORY_CARE_PROVIDER_SITE_OTHER): Payer: Medicare Other | Admitting: Podiatry

## 2015-11-14 VITALS — BP 107/88 | HR 69 | Resp 12

## 2015-11-14 DIAGNOSIS — F419 Anxiety disorder, unspecified: Secondary | ICD-10-CM | POA: Diagnosis not present

## 2015-11-14 DIAGNOSIS — F319 Bipolar disorder, unspecified: Secondary | ICD-10-CM | POA: Insufficient documentation

## 2015-11-14 DIAGNOSIS — E669 Obesity, unspecified: Secondary | ICD-10-CM | POA: Insufficient documentation

## 2015-11-14 DIAGNOSIS — Z79899 Other long term (current) drug therapy: Secondary | ICD-10-CM | POA: Diagnosis not present

## 2015-11-14 DIAGNOSIS — M2030 Hallux varus (acquired), unspecified foot: Secondary | ICD-10-CM

## 2015-11-14 DIAGNOSIS — K219 Gastro-esophageal reflux disease without esophagitis: Secondary | ICD-10-CM | POA: Diagnosis not present

## 2015-11-14 DIAGNOSIS — R21 Rash and other nonspecific skin eruption: Secondary | ICD-10-CM | POA: Insufficient documentation

## 2015-11-14 DIAGNOSIS — R0789 Other chest pain: Principal | ICD-10-CM | POA: Insufficient documentation

## 2015-11-14 DIAGNOSIS — J45909 Unspecified asthma, uncomplicated: Secondary | ICD-10-CM | POA: Insufficient documentation

## 2015-11-14 DIAGNOSIS — L03031 Cellulitis of right toe: Secondary | ICD-10-CM

## 2015-11-14 DIAGNOSIS — R079 Chest pain, unspecified: Secondary | ICD-10-CM | POA: Diagnosis not present

## 2015-11-14 DIAGNOSIS — G894 Chronic pain syndrome: Secondary | ICD-10-CM

## 2015-11-14 DIAGNOSIS — Z6838 Body mass index (BMI) 38.0-38.9, adult: Secondary | ICD-10-CM | POA: Diagnosis not present

## 2015-11-14 DIAGNOSIS — I10 Essential (primary) hypertension: Secondary | ICD-10-CM | POA: Insufficient documentation

## 2015-11-14 DIAGNOSIS — Z7951 Long term (current) use of inhaled steroids: Secondary | ICD-10-CM | POA: Insufficient documentation

## 2015-11-14 DIAGNOSIS — F32A Depression, unspecified: Secondary | ICD-10-CM

## 2015-11-14 DIAGNOSIS — E785 Hyperlipidemia, unspecified: Secondary | ICD-10-CM | POA: Diagnosis not present

## 2015-11-14 DIAGNOSIS — R0602 Shortness of breath: Secondary | ICD-10-CM | POA: Insufficient documentation

## 2015-11-14 DIAGNOSIS — M2040 Other hammer toe(s) (acquired), unspecified foot: Secondary | ICD-10-CM | POA: Diagnosis not present

## 2015-11-14 DIAGNOSIS — Z8249 Family history of ischemic heart disease and other diseases of the circulatory system: Secondary | ICD-10-CM | POA: Diagnosis not present

## 2015-11-14 DIAGNOSIS — E876 Hypokalemia: Secondary | ICD-10-CM | POA: Insufficient documentation

## 2015-11-14 DIAGNOSIS — F329 Major depressive disorder, single episode, unspecified: Secondary | ICD-10-CM

## 2015-11-14 DIAGNOSIS — L97519 Non-pressure chronic ulcer of other part of right foot with unspecified severity: Secondary | ICD-10-CM | POA: Diagnosis not present

## 2015-11-14 DIAGNOSIS — G4730 Sleep apnea, unspecified: Secondary | ICD-10-CM | POA: Diagnosis not present

## 2015-11-14 LAB — CBC
HCT: 35.2 % — ABNORMAL LOW (ref 36.0–46.0)
Hemoglobin: 11.6 g/dL — ABNORMAL LOW (ref 12.0–15.0)
MCH: 31.1 pg (ref 26.0–34.0)
MCHC: 33 g/dL (ref 30.0–36.0)
MCV: 94.4 fL (ref 78.0–100.0)
Platelets: 281 10*3/uL (ref 150–400)
RBC: 3.73 MIL/uL — ABNORMAL LOW (ref 3.87–5.11)
RDW: 13.4 % (ref 11.5–15.5)
WBC: 5.6 10*3/uL (ref 4.0–10.5)

## 2015-11-14 LAB — BASIC METABOLIC PANEL
Anion gap: 8 (ref 5–15)
BUN: <5 mg/dL — ABNORMAL LOW (ref 6–20)
CO2: 24 mmol/L (ref 22–32)
Calcium: 8.8 mg/dL — ABNORMAL LOW (ref 8.9–10.3)
Chloride: 103 mmol/L (ref 101–111)
Creatinine, Ser: 0.71 mg/dL (ref 0.44–1.00)
GFR calc Af Amer: >60 mL/min (ref >60)
GFR calc non Af Amer: >60 mL/min (ref >60)
Glucose, Bld: 108 mg/dL — ABNORMAL HIGH (ref 65–99)
Potassium: 2.6 mmol/L — CL (ref 3.5–5.1)
Sodium: 135 mmol/L (ref 135–145)

## 2015-11-14 LAB — I-STAT TROPONIN, ED: Troponin i, poc: 0 ng/mL (ref 0.00–0.08)

## 2015-11-14 LAB — MAGNESIUM: Magnesium: 1.6 mg/dL — ABNORMAL LOW (ref 1.7–2.4)

## 2015-11-14 MED ORDER — PROMETHAZINE HCL 25 MG PO TABS: 12.5000 mg | ORAL_TABLET | Freq: Three times a day (TID) | ORAL | Status: DC | PRN

## 2015-11-14 MED ORDER — ONDANSETRON HCL 4 MG/2ML IJ SOLN: 4.0000 mg | Freq: Four times a day (QID) | INTRAMUSCULAR | Status: DC | PRN

## 2015-11-14 MED ORDER — ALBUTEROL SULFATE (2.5 MG/3ML) 0.083% IN NEBU: 3.0000 mL | INHALATION_SOLUTION | RESPIRATORY_TRACT | Status: DC | PRN

## 2015-11-14 MED ORDER — MORPHINE SULFATE (PF) 2 MG/ML IV SOLN: 2.0000 mg | INTRAVENOUS | Status: DC | PRN

## 2015-11-14 MED ORDER — MAGNESIUM SULFATE IN D5W 1-5 GM/100ML-% IV SOLN
1.0000 g | Freq: Once | INTRAVENOUS | Status: AC
  Administered 2015-11-14: 1 g via INTRAVENOUS
  Filled 2015-11-14: qty 100

## 2015-11-14 MED ORDER — HALOPERIDOL 5 MG PO TABS
5.0000 mg | ORAL_TABLET | Freq: Every day | ORAL | Status: DC
  Administered 2015-11-15: 5 mg via ORAL
  Filled 2015-11-14 (×2): qty 1

## 2015-11-14 MED ORDER — CYCLOBENZAPRINE HCL 10 MG PO TABS
10.0000 mg | ORAL_TABLET | Freq: Three times a day (TID) | ORAL | Status: DC | PRN
  Administered 2015-11-15: 10 mg via ORAL
  Filled 2015-11-14: qty 1

## 2015-11-14 MED ORDER — POTASSIUM CHLORIDE CRYS ER 20 MEQ PO TBCR
40.0000 meq | EXTENDED_RELEASE_TABLET | Freq: Once | ORAL | Status: AC
  Administered 2015-11-14: 40 meq via ORAL
  Filled 2015-11-14: qty 2

## 2015-11-14 MED ORDER — PANTOPRAZOLE SODIUM 40 MG PO TBEC
40.0000 mg | DELAYED_RELEASE_TABLET | Freq: Two times a day (BID) | ORAL | Status: DC
  Administered 2015-11-15 (×2): 40 mg via ORAL
  Filled 2015-11-14 (×2): qty 1

## 2015-11-14 MED ORDER — CITALOPRAM HYDROBROMIDE 10 MG PO TABS
40.0000 mg | ORAL_TABLET | Freq: Every day | ORAL | Status: DC
  Administered 2015-11-15: 40 mg via ORAL
  Filled 2015-11-14 (×2): qty 4

## 2015-11-14 MED ORDER — ENOXAPARIN SODIUM 40 MG/0.4ML ~~LOC~~ SOLN
40.0000 mg | SUBCUTANEOUS | Status: DC
  Administered 2015-11-15: 40 mg via SUBCUTANEOUS
  Filled 2015-11-14: qty 0.4

## 2015-11-14 MED ORDER — FLUTICASONE PROPIONATE 50 MCG/ACT NA SUSP
2.0000 | Freq: Every day | NASAL | Status: DC
  Administered 2015-11-15: 2 via NASAL
  Filled 2015-11-14: qty 16

## 2015-11-14 MED ORDER — POTASSIUM CHLORIDE 10 MEQ/100ML IV SOLN
10.0000 meq | INTRAVENOUS | Status: AC
  Administered 2015-11-15 (×2): 10 meq via INTRAVENOUS
  Filled 2015-11-14 (×2): qty 100

## 2015-11-14 MED ORDER — IOPAMIDOL (ISOVUE-370) INJECTION 76%
INTRAVENOUS | Status: AC
Start: 2015-11-14 — End: 2015-11-14
  Administered 2015-11-14: 100 mL
  Filled 2015-11-14: qty 100

## 2015-11-14 MED ORDER — ACETAMINOPHEN 325 MG PO TABS: 650.0000 mg | ORAL_TABLET | ORAL | Status: DC | PRN

## 2015-11-14 MED ORDER — PRAZOSIN HCL 5 MG PO CAPS
5.0000 mg | ORAL_CAPSULE | Freq: Every day | ORAL | Status: DC
  Administered 2015-11-15: 5 mg via ORAL
  Filled 2015-11-14 (×2): qty 1

## 2015-11-14 MED ORDER — BENZTROPINE MESYLATE 1 MG PO TABS
1.0000 mg | ORAL_TABLET | Freq: Two times a day (BID) | ORAL | Status: DC
  Administered 2015-11-15 (×2): 1 mg via ORAL
  Filled 2015-11-14 (×3): qty 1

## 2015-11-14 MED ORDER — TRAZODONE HCL 100 MG PO TABS
100.0000 mg | ORAL_TABLET | Freq: Every day | ORAL | Status: DC
  Administered 2015-11-15: 100 mg via ORAL
  Filled 2015-11-14: qty 1

## 2015-11-14 MED ORDER — GABAPENTIN 600 MG PO TABS
600.0000 mg | ORAL_TABLET | Freq: Three times a day (TID) | ORAL | Status: DC
  Administered 2015-11-15 (×2): 600 mg via ORAL
  Filled 2015-11-14 (×2): qty 1

## 2015-11-14 MED ORDER — POTASSIUM CHLORIDE 10 MEQ/100ML IV SOLN
10.0000 meq | Freq: Once | INTRAVENOUS | Status: AC
  Administered 2015-11-14: 10 meq via INTRAVENOUS
  Filled 2015-11-14: qty 100

## 2015-11-14 MED ORDER — HYDROCHLOROTHIAZIDE 25 MG PO TABS
25.0000 mg | ORAL_TABLET | Freq: Every day | ORAL | Status: DC
  Administered 2015-11-15: 25 mg via ORAL
  Filled 2015-11-14: qty 1

## 2015-11-14 MED ORDER — HYDROXYZINE HCL 10 MG PO TABS
10.0000 mg | ORAL_TABLET | Freq: Three times a day (TID) | ORAL | Status: DC
  Administered 2015-11-15 (×2): 10 mg via ORAL
  Filled 2015-11-14 (×4): qty 1

## 2015-11-14 NOTE — ED Provider Notes (Signed)
[facility] DEPT Provider Note   CSN: [id number] Arrival date & time: 11/14/15  1932  First Provider Contact:  None       History   Chief Complaint Chief Complaint  Patient presents with  . Chest Pain    HPI [name] [name] is a 50 y.o. female with history of HTN and HLD who presents to the ED for evaluation of chest pain and shortness of breath. The patient reports that her shortness of breath and chest pain began 1 week ago while at rest but is worse today prompting her to come in for evaluation. She describes the chest pain as "tightness" and located midsternal. Chest pain does not worsen with movement. She also reports her HR has been elevated and at home, ranging from 90-112 over the past week.  She describes a significant cardiac history in her family with her brother having his first MI at 42 and her father having MI's as well. She denies any cardiac history herself.  She reports her right leg has been in a brace x3 weeks after a fall. She also reports her legs have been swelling over the past week and notes a new rash on her legs.  She denies any fever, chills, cough, hemoptysis, nausea, vomiting, abdominal pain or diarrhea.   Chest Pain   Associated symptoms include diaphoresis, palpitations and shortness of breath. Pertinent negatives include no abdominal pain, no cough, no dizziness, no fever, no nausea and no vomiting.  Pertinent negatives for past medical history include no seizures.    Past Medical History:  Diagnosis Date  . Anxiety   . Asthma   . Bipolar 1 disorder ([place])    ect treatments last treatment Sep 02 1011  . Depression   . GERD (gastroesophageal reflux disease)   . Hypertension   . Seizures ([place])    last seizure was so long ago she can't remember.   . Sleep apnea     Patient Active Problem List   Diagnosis Date Noted  . Cellulitis of great toe of right foot 08/12/2015  . Toe ulcer, right ([place]) 04/11/2015  . Cellulitis of toe of right foot  04/11/2015  . Pain in the chest   . Chest pain 10/26/2014  . Obesity 10/26/2014  . Hypokalemia 10/26/2014  . Manic bipolar I disorder ([place]) 10/26/2014  . Atypical angina ([place]) 10/26/2014  . Manic bipolar I disorder with rapid cycling ([place]) 05/18/2011    Class: Acute    Past Surgical History:  Procedure Laterality Date  . ABDOMINAL HYSTERECTOMY    . BACK SURGERY    . Laproscopic knee surgery      OB History    No data available       Home Medications    Prior to Admission medications   Medication Sig Start Date End Date Taking? Authorizing Provider  AMITIZA 24 MCG capsule TAKE 1 CAPSULE TWICE A DAY WITH A MEAL 10/07/15   [name], MD  benztropine (COGENTIN) 1 MG tablet Take 1 mg by mouth 2 (two) times daily.     Historical Provider, MD  Calcium Carbonate-Vit D-Min (CALCIUM 1200 PO) Take 1 tablet by mouth daily.    Historical Provider, MD  citalopram (CELEXA) 40 MG tablet Take 40 mg by mouth at bedtime.  05/28/11   Darrol Jump, MD  cyclobenzaprine (FLEXERIL) 10 MG tablet Take 10 mg by mouth 3 (three) times daily as needed for muscle spasms.    Historical Provider, MD  diazepam (VALIUM) 5  MG tablet 1 tab po 30 minutes prior to procedure 10/02/15   [name], MD  fluticasone [facility]) 50 MCG/ACT nasal spray Place 2 sprays into both nostrils daily. 12/05/14   [name], MD  gabapentin (NEURONTIN) 600 MG tablet Take 1 tablet (600 mg total) by mouth 3 (three) times daily. For anxiety and pain management. May have to stop to take ECT. 05/28/11   Darrol Jump, MD  haloperidol (HALDOL) 5 MG tablet Take 1 tablet (5 mg total) by mouth 2 (two) times daily. For psychosis Patient taking differently: Take 2.5 mg by mouth at bedtime. For psychosis 05/28/11   Darrol Jump, MD  hydrochlorothiazide (HYDRODIURIL) 25 MG tablet TAKE 1 TABLET EVERY DAY 09/25/15   [name], MD  HYDROcodone-acetaminophen [facility]) 10-325 MG per tablet Take 1 tablet by mouth every 8 (eight) hours.      Historical Provider, MD  hydrOXYzine (ATARAX/VISTARIL) 10 MG tablet Take 10 mg by mouth 3 (three) times daily.  11/25/14   Historical Provider, MD  Multiple Vitamins-Minerals (MULTIVITAMIN WITH MINERALS) tablet Take 1 tablet by mouth daily.    Historical Provider, MD  nitroGLYCERIN (NITROSTAT) 0.4 MG SL tablet Place 1 tablet (0.4 mg total) under the tongue every 5 (five) minutes as needed for chest pain. 10/27/14   Leone Brand, MD  oxymorphone (OPANA) 10 MG tablet Take 10 mg by mouth 2 (two) times daily as needed for pain.    Historical Provider, MD  pantoprazole (PROTONIX) 40 MG tablet TAKE 1 TABLET BY MOUTH TWICE A DAY 05/23/15   [name], MD  prazosin (MINIPRESS) 5 MG capsule Take 5 mg by mouth at bedtime.    Historical Provider, MD  PROAIR HFA 108 (90 BASE) MCG/ACT inhaler INHALE 2 PUFFS INTO THE LUNGS EVERY 4 HOURS AS NEEDED FOR WHEEZING 10/01/14   [name], MD  promethazine (PHENERGAN) 12.5 MG tablet Take 1 tablet (12.5 mg total) by mouth every 8 (eight) hours as needed for nausea or vomiting. 08/11/15   [name], DPM  sulfamethoxazole-trimethoprim (BACTRIM DS,SEPTRA DS) 800-160 MG tablet Take 1 tablet by mouth 2 (two) times daily. 10/31/15   [name], DPM  SUMAtriptan (IMITREX) 100 MG tablet TAKE 1 TABLET (100 MG TOTAL) BY MOUTH EVERY 2 (TWO) HOURS AS NEEDED. MIGRANES 05/27/14   [name], MD  traZODone (DESYREL) 100 MG tablet Take 100 mg by mouth at bedtime. 07/29/14   Historical Provider, MD  VOLTAREN 1 % GEL Apply topically daily. 06/29/13   Historical Provider, MD    Family History Family History  Problem Relation Age of Onset  . Diabetes Mother   . COPD Mother   . Hypertension Mother   . Hyperlipidemia Mother   . Heart disease Father   . Hyperlipidemia Father   . Hypertension Father   . Heart disease Brother   . Heart disease Daughter     Social History Social History  Substance Use Topics  . Smoking status: Never Smoker  . Smokeless  tobacco: Never Used  . Alcohol use No     Allergies   Tetracyclines & related; Tramadol; Ciprofloxacin; Codeine; Morphine and related; and Penicillins   Review of Systems Review of Systems  Constitutional: Positive for activity change and diaphoresis. Negative for chills and fever.       Patient reports right leg immobilized in brace x3 weeks due to knee injury.  Respiratory: Positive for chest tightness and shortness of breath. Negative for cough and  wheezing.   Cardiovascular: Positive for chest pain, palpitations and leg swelling.  Gastrointestinal: Positive for constipation. Negative for abdominal distention, abdominal pain, blood in stool, diarrhea, nausea and vomiting.       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  Musculoskeletal: Positive for joint swelling. Negative for neck pain and neck stiffness.       Right knee, swollen since fall 3 weeks ago  Neurological: Negative for dizziness, seizures, syncope and light-headedness.     Physical Exam Updated Vital Signs BP 109/72   Pulse 87   Temp 98.8 F (37.1 C) (Oral)   Resp 15   SpO2 99%   Physical Exam  Constitutional: She is oriented to person, place, and time. She appears well-developed and well-nourished.  HENT:  Head: Normocephalic and atraumatic.  Eyes: Pupils are equal, round, and reactive to light.  Cardiovascular: Regular rhythm.  Tachycardia present.  Exam reveals no distant heart sounds.   Tachycardia rate 102  Pulmonary/Chest: Effort normal and breath sounds normal. No accessory muscle usage. Tachypnea noted. No respiratory distress.  Visibly tachypneic, rate 26  Abdominal: Soft. Bowel sounds are normal. There is no tenderness.  Musculoskeletal: She exhibits edema.  Some edema of BL LE, R>L.   Neurological: She is alert and oriented to person, place, and time.  Skin: She is not diaphoretic.     ED  Treatments / Results  Labs (all labs ordered are listed, but only abnormal results are displayed) Labs Reviewed  [place], ED    EKG  Date: 11/14/2015  Rate: 99  Rhythm: normal sinus rhythm  QRS Axis: normal  Intervals: normal  ST/T Wave abnormalities: normal  Conduction Disutrbances: Borderline prolonged QT  Narrative Interpretation: Nonspecific EKG   Radiology Ct Angio Chest Pe W/cm &/or Wo Cm  Result Date: 11/14/2015 CLINICAL DATA:  50 year old female with shortness of breath and tachycardia EXAM: CT ANGIOGRAPHY CHEST WITH CONTRAST TECHNIQUE: Multidetector CT imaging of the chest was performed using the standard protocol during bolus administration of intravenous contrast. Multiplanar CT image reconstructions and MIPs were obtained to evaluate the vascular anatomy. CONTRAST:  80 cc Isovue 370 COMPARISON:  Chest radiograph dated 10/26/2014 and CT dated 10/26/2014 FINDINGS: Evaluation is limited due to streak artifact caused by metallic spinal hardware. There minimal bibasilar dependent atelectatic changes of the lungs. There is no focal consolidation, pleural effusion, or pneumothorax. The central airways are patent. The thoracic aorta appears unremarkable. The origins of the great vessels of the aortic arch appear patent. There is no CT evidence of pulmonary embolism. There is no cardiomegaly or pericardial effusion. No hilar or mediastinal adenopathy. The esophagus is grossly unremarkable with no thyroid nodules identified. There is no axillary or supraclavicular adenopathy. The chest wall soft tissues appear unremarkable. There is multilevel degenerative changes of the spine. Posterior thoracic Harrington rod. Age-indeterminate T9 compression fracture with approximately 50% loss of vertebral body height and anterior wedging. The visualized upper abdomen appears unremarkable. Review of the MIP images confirms the above findings. IMPRESSION: No CT  evidence of pulmonary embolism. Electronically Signed   By: Anner Crete M.D.   On: 11/14/2015 22:38   Procedures Procedures (including critical care time)  Medications Ordered in ED Medications  potassium chloride 10 mEq in 100 mL IVPB (10 mEq Intravenous New Bag/Given 11/14/15 2301)  magnesium sulfate IVPB 1 g 100 mL (not administered)  iopamidol (ISOVUE-370) 76 % injection (100  mLs  Contrast Given 11/14/15 2216)  potassium chloride SA (K-DUR,KLOR-CON) CR tablet 40 mEq (40 mEq Oral Given 11/14/15 2301)     Initial Impression / Assessment and Plan / ED Course  I have reviewed the triage vital signs and the nursing notes.  Pertinent labs & imaging results that were available during my care of the patient were reviewed by me and considered in my medical decision making (see chart for details).  Clinical Course    CTA, Troponin, CBC and BMP ordered for evaluation.  Potassium level 2.6. 81mEq K IV ordered as well as 16mEq K-dur. Magnesium level ordered and replaced with 1gm mag sulfate.  Patient continues to deny any vomiting or diarrhea. She reports she has been on HCTZ x6 months.  CTA showed no evidence of PE.   Troponin 0.  HEART Score 4.   Final Clinical Impressions(s) / ED Diagnoses   Final diagnoses:  Nonspecific chest pain  Hypokalemia    New Prescriptions New Prescriptions   No medications on file     Larah Kuntzman, DO 11/14/15 [id number]    Leonard Schwartz, MD 11/20/15 1655

## 2015-11-14 NOTE — ED Notes (Signed)
Patient transported to CT 

## 2015-11-14 NOTE — H&P (Signed)
History and Physical    [name] [name] [id number] DOB: [birth year] DOA: 11/14/2015   PCP: Odette Fraction, MD Chief Complaint:  Chief Complaint  Patient presents with  . Chest Pain    HPI: [name] [name] is a 50 y.o. female with medical history significant of HTN, HLD.  Patient presents to the ED with c/o CP with SOB.  Symptoms onset 1 week ago while at rest but is worse today so she presents to ED for evaluation.  CP is "tightness" quality, located in mid sternum.  No worsening with movement.  Has fh of early MI.  No personal CAD history.  Has been in brace on R leg for 3 weeks after fall.  Also reports 1 day history of rash.  ED Course: trop negative.  Review of Systems: As per HPI otherwise 10 point review of systems negative.    Past Medical History:  Diagnosis Date  . Anxiety   . Asthma   . Bipolar 1 disorder ([place])    ect treatments last treatment Sep 02 1011  . Depression   . GERD (gastroesophageal reflux disease)   . Hypertension   . Seizures ([place])    last seizure was so long ago she can't remember.   . Sleep apnea     Past Surgical History:  Procedure Laterality Date  . ABDOMINAL HYSTERECTOMY    . BACK SURGERY    . Laproscopic knee surgery       reports that she has never smoked. She has never used smokeless tobacco. She reports that she does not drink alcohol or use drugs.  Allergies  Allergen Reactions  . Tetracyclines & Related Other (See Comments)    syncope  . Tramadol Other (See Comments)    seizures  . Ciprofloxacin Rash  . Codeine Itching  . Morphine And Related Other (See Comments)    Memory loss  . Penicillins Hives and Rash    Family History  Problem Relation Age of Onset  . Diabetes Mother   . COPD Mother   . Hypertension Mother   . Hyperlipidemia Mother   . Heart disease Father   . Hyperlipidemia Father   . Hypertension Father   . Heart disease Brother   . Heart disease Daughter       Prior to Admission medications     Medication Sig Start Date End Date Taking? Authorizing Provider  AMITIZA 24 MCG capsule TAKE 1 CAPSULE TWICE A DAY WITH A MEAL 10/07/15  Yes [name], MD  benztropine (COGENTIN) 1 MG tablet Take 1 mg by mouth 2 (two) times daily.    Yes Historical Provider, MD  Calcium Carbonate-Vit D-Min (CALCIUM 1200 PO) Take 1 tablet by mouth daily.   Yes Historical Provider, MD  citalopram (CELEXA) 40 MG tablet Take 40 mg by mouth at bedtime.  05/28/11  Yes Darrol Jump, MD  cyclobenzaprine (FLEXERIL) 10 MG tablet Take 10 mg by mouth 3 (three) times daily as needed for muscle spasms.   Yes Historical Provider, MD  fluticasone (FLONASE) 50 MCG/ACT nasal spray Place 2 sprays into both nostrils daily. 12/05/14  Yes [name], MD  gabapentin (NEURONTIN) 600 MG tablet Take 1 tablet (600 mg total) by mouth 3 (three) times daily. For anxiety and pain management. May have to stop to take ECT. 05/28/11  Yes Darrol Jump, MD  haloperidol (HALDOL) 5 MG tablet Take 1 tablet (5 mg total) by mouth 2 (two) times daily. For psychosis Patient taking differently: Take  5 mg by mouth at bedtime. For psychosis 05/28/11  Yes Darrol Jump, MD  hydrochlorothiazide (HYDRODIURIL) 25 MG tablet TAKE 1 TABLET EVERY DAY 09/25/15  Yes [name], MD  HYDROcodone-acetaminophen [facility]) 10-325 MG per tablet Take 1 tablet by mouth every 8 (eight) hours.    Yes Historical Provider, MD  hydrOXYzine (ATARAX/VISTARIL) 10 MG tablet Take 10 mg by mouth 3 (three) times daily.  11/25/14  Yes Historical Provider, MD  Multiple Vitamins-Minerals (MULTIVITAMIN WITH MINERALS) tablet Take 1 tablet by mouth daily.   Yes Historical Provider, MD  nitroGLYCERIN (NITROSTAT) 0.4 MG SL tablet Place 1 tablet (0.4 mg total) under the tongue every 5 (five) minutes as needed for chest pain. 10/27/14  Yes Leone Brand, MD  oxymorphone (OPANA) 10 MG tablet Take 10 mg by mouth 2 (two) times daily as needed for pain.   Yes Historical Provider, MD   pantoprazole (PROTONIX) 40 MG tablet TAKE 1 TABLET BY MOUTH TWICE A DAY 05/23/15  Yes [name], MD  prazosin (MINIPRESS) 5 MG capsule Take 5 mg by mouth at bedtime.   Yes Historical Provider, MD  PROAIR HFA 108 (90 BASE) MCG/ACT inhaler INHALE 2 PUFFS INTO THE LUNGS EVERY 4 HOURS AS NEEDED FOR WHEEZING 10/01/14  Yes [name], MD  promethazine (PHENERGAN) 12.5 MG tablet Take 1 tablet (12.5 mg total) by mouth every 8 (eight) hours as needed for nausea or vomiting. 08/11/15  Yes [name], DPM  sulfamethoxazole-trimethoprim (BACTRIM DS,SEPTRA DS) 800-160 MG tablet Take 1 tablet by mouth 2 (two) times daily. 10/31/15  Yes [name], DPM  SUMAtriptan (IMITREX) 100 MG tablet TAKE 1 TABLET (100 MG TOTAL) BY MOUTH EVERY 2 (TWO) HOURS AS NEEDED. MIGRANES 05/27/14  Yes [name], MD  traZODone (DESYREL) 100 MG tablet Take 100 mg by mouth at bedtime. 07/29/14  Yes Historical Provider, MD  VOLTAREN 1 % GEL Apply 2 g topically daily as needed (for pain).  06/29/13  Yes Historical Provider, MD    Physical Exam: Vitals:   11/14/15 2030 11/14/15 2100 11/14/15 2130 11/14/15 2200  BP: 112/77 109/72 109/62 100/74  Pulse: 91 87 84 84  Resp: (!) '27 15 18 16  '$ Temp:      TempSrc:      SpO2: 96% 99% 96% 97%      Constitutional: NAD, calm, comfortable Eyes: PERRL, lids and conjunctivae normal ENMT: Mucous membranes are moist. Posterior pharynx clear of any exudate or lesions.Normal dentition.  Neck: normal, supple, no masses, no thyromegaly Respiratory: clear to auscultation bilaterally, no wheezing, no crackles. Normal respiratory effort. No accessory muscle use.  Cardiovascular: Regular rate and rhythm, no murmurs / rubs / gallops. No extremity edema. 2+ pedal pulses. No carotid bruits.  Abdomen: no tenderness, no masses palpated. No hepatosplenomegaly. Bowel sounds positive.  Musculoskeletal: no clubbing / cyanosis. No joint deformity upper and lower extremities. Good ROM, no  contractures. Normal muscle tone.  Skin: Diffuse Rash on extremities.  No splinter hemorrhages, oslow nodes, janeway lesions. Neurologic: CN 2-12 grossly intact. Sensation intact, DTR normal. Strength 5/5 in all 4.  Psychiatric: Normal judgment and insight. Alert and oriented x 3. Normal mood.    Labs on Admission: I have personally reviewed following labs and imaging studies  CBC:  Recent Labs Lab 11/14/15 2109  WBC 5.6  HGB 11.6*  HCT 35.2*  MCV 94.4  PLT 929   Basic Metabolic Panel:  Recent Labs Lab 11/14/15 2109  NA 135  K 2.6*  CL 103  CO2 24  GLUCOSE 108*  BUN <5*  CREATININE 0.71  CALCIUM 8.8*  MG 1.6*   GFR: CrCl cannot be calculated (Unknown ideal weight.). Liver Function Tests: No results for input(s): AST, ALT, ALKPHOS, BILITOT, PROT, ALBUMIN in the last 168 hours. No results for input(s): LIPASE, AMYLASE in the last 168 hours. No results for input(s): AMMONIA in the last 168 hours. Coagulation Profile: No results for input(s): INR, PROTIME in the last 168 hours. Cardiac Enzymes: No results for input(s): CKTOTAL, CKMB, CKMBINDEX, TROPONINI in the last 168 hours. BNP (last 3 results) No results for input(s): PROBNP in the last 8760 hours. HbA1C: No results for input(s): HGBA1C in the last 72 hours. CBG: No results for input(s): GLUCAP in the last 168 hours. Lipid Profile: No results for input(s): CHOL, HDL, LDLCALC, TRIG, CHOLHDL, LDLDIRECT in the last 72 hours. Thyroid Function Tests: No results for input(s): TSH, T4TOTAL, FREET4, T3FREE, THYROIDAB in the last 72 hours. Anemia Panel: No results for input(s): VITAMINB12, FOLATE, FERRITIN, TIBC, IRON, RETICCTPCT in the last 72 hours. Urine analysis:    Component Value Date/Time   COLORURINE YELLOW 01/18/2015 2237   APPEARANCEUR CLOUDY (A) 01/18/2015 2237   LABSPEC 1.021 01/18/2015 2237   PHURINE 5.0 01/18/2015 2237   GLUCOSEU NEGATIVE 01/18/2015 2237   HGBUR NEGATIVE 01/18/2015 2237    BILIRUBINUR NEGATIVE 01/18/2015 2237   KETONESUR NEGATIVE 01/18/2015 2237   PROTEINUR NEGATIVE 01/18/2015 2237   UROBILINOGEN 0.2 01/18/2015 2237   NITRITE NEGATIVE 01/18/2015 2237   LEUKOCYTESUR NEGATIVE 01/18/2015 2237   Sepsis Labs: '@LABRCNTIP'$ (procalcitonin:4,lacticidven:4) )No results found for this or any previous visit (from the past 240 hour(s)).   Radiological Exams on Admission: Ct Angio Chest Pe W/cm &/or Wo Cm  Result Date: 11/14/2015 CLINICAL DATA:  50 year old female with shortness of breath and tachycardia EXAM: CT ANGIOGRAPHY CHEST WITH CONTRAST TECHNIQUE: Multidetector CT imaging of the chest was performed using the standard protocol during bolus administration of intravenous contrast. Multiplanar CT image reconstructions and MIPs were obtained to evaluate the vascular anatomy. CONTRAST:  80 cc Isovue 370 COMPARISON:  Chest radiograph dated 10/26/2014 and CT dated 10/26/2014 FINDINGS: Evaluation is limited due to streak artifact caused by metallic spinal hardware. There minimal bibasilar dependent atelectatic changes of the lungs. There is no focal consolidation, pleural effusion, or pneumothorax. The central airways are patent. The thoracic aorta appears unremarkable. The origins of the great vessels of the aortic arch appear patent. There is no CT evidence of pulmonary embolism. There is no cardiomegaly or pericardial effusion. No hilar or mediastinal adenopathy. The esophagus is grossly unremarkable with no thyroid nodules identified. There is no axillary or supraclavicular adenopathy. The chest wall soft tissues appear unremarkable. There is multilevel degenerative changes of the spine. Posterior thoracic Harrington rod. Age-indeterminate T9 compression fracture with approximately 50% loss of vertebral body height and anterior wedging. The visualized upper abdomen appears unremarkable. Review of the MIP images confirms the above findings. IMPRESSION: No CT evidence of pulmonary  embolism. Electronically Signed   By: Anner Crete M.D.   On: 11/14/2015 22:38   EKG: Independently reviewed.  Assessment/Plan Principal Problem:   Chest pain Active Problems:   Rash    CP -  CP obs pathway  Serial trops  NPO  Stress test ordered for AM  Rash -  Not clear what is causing rash  Will get BCx  Checking ESR and CRP  Maybe consider derm eval if this persists?  Looks almost  like a drug reaction, but no new meds she reports.  Hypokalemia - replacing potassium, repeat BMP in AM   DVT prophylaxis: Lovenox Code Status: Full Family Communication: Family at bedside Consults called: None Admission status: Admit to obs   Emori Kamau, [facility] Pager [phone] from 7PM-7AM  If 7AM-7PM, please contact the day physician for the patient [email or web address] Password [facility]  11/14/2015, 11:50 PM

## 2015-11-14 NOTE — ED Triage Notes (Signed)
Patient coming from friends house with complains of chest pain.  Had epigastric pain over a week ago.  Today it got worse.  Denies n/v. Does have SOB. Does have a full leg cast has been in that for 3 weeks.  Gave 2 nitro and 324 ASA. A&Ox4

## 2015-11-14 NOTE — ED Notes (Signed)
Attempted to call report

## 2015-11-15 ENCOUNTER — Observation Stay (HOSPITAL_COMMUNITY): Payer: Medicare Other

## 2015-11-15 ENCOUNTER — Observation Stay (HOSPITAL_BASED_OUTPATIENT_CLINIC_OR_DEPARTMENT_OTHER): Payer: Medicare Other

## 2015-11-15 DIAGNOSIS — F329 Major depressive disorder, single episode, unspecified: Secondary | ICD-10-CM

## 2015-11-15 DIAGNOSIS — M2030 Hallux varus (acquired), unspecified foot: Secondary | ICD-10-CM | POA: Insufficient documentation

## 2015-11-15 DIAGNOSIS — R079 Chest pain, unspecified: Secondary | ICD-10-CM

## 2015-11-15 DIAGNOSIS — R0789 Other chest pain: Secondary | ICD-10-CM | POA: Diagnosis not present

## 2015-11-15 DIAGNOSIS — K219 Gastro-esophageal reflux disease without esophagitis: Secondary | ICD-10-CM

## 2015-11-15 DIAGNOSIS — F32A Depression, unspecified: Secondary | ICD-10-CM

## 2015-11-15 DIAGNOSIS — E876 Hypokalemia: Secondary | ICD-10-CM | POA: Diagnosis not present

## 2015-11-15 DIAGNOSIS — R21 Rash and other nonspecific skin eruption: Secondary | ICD-10-CM | POA: Diagnosis not present

## 2015-11-15 DIAGNOSIS — G894 Chronic pain syndrome: Secondary | ICD-10-CM

## 2015-11-15 LAB — SEDIMENTATION RATE: Sed Rate: 40 mm/hr — ABNORMAL HIGH (ref 0–22)

## 2015-11-15 LAB — TROPONIN I
Troponin I: <0.03 ng/mL (ref <0.03)
Troponin I: <0.03 ng/mL (ref <0.03)
Troponin I: <0.03 ng/mL (ref <0.03)

## 2015-11-15 LAB — BASIC METABOLIC PANEL
Anion gap: 7 (ref 5–15)
BUN: <5 mg/dL — ABNORMAL LOW (ref 6–20)
CO2: 25 mmol/L (ref 22–32)
Calcium: 8.7 mg/dL — ABNORMAL LOW (ref 8.9–10.3)
Chloride: 105 mmol/L (ref 101–111)
Creatinine, Ser: 0.63 mg/dL (ref 0.44–1.00)
GFR calc Af Amer: >60 mL/min (ref >60)
GFR calc non Af Amer: >60 mL/min (ref >60)
Glucose, Bld: 95 mg/dL (ref 65–99)
Potassium: 3.4 mmol/L — ABNORMAL LOW (ref 3.5–5.1)
Sodium: 137 mmol/L (ref 135–145)

## 2015-11-15 LAB — NM MYOCAR MULTI W/SPECT W/WALL MOTION / EF
Estimated workload: 1 METS
Exercise duration (min): 5 min
Exercise duration (sec): 0 s
Peak HR: 88 {beats}/min
Rest HR: 83 {beats}/min

## 2015-11-15 LAB — C-REACTIVE PROTEIN: CRP: 2.5 mg/dL — ABNORMAL HIGH (ref <1.0)

## 2015-11-15 MED ORDER — TECHNETIUM TC 99M TETROFOSMIN IV KIT
10.0000 | PACK | Freq: Once | INTRAVENOUS | Status: AC | PRN
  Administered 2015-11-15: 10 via INTRAVENOUS

## 2015-11-15 MED ORDER — HYDROMORPHONE HCL 1 MG/ML IJ SOLN
0.5000 mg | INTRAMUSCULAR | Status: DC | PRN
  Administered 2015-11-15 (×3): 0.5 mg via INTRAVENOUS
  Filled 2015-11-15 (×3): qty 1

## 2015-11-15 MED ORDER — REGADENOSON 0.4 MG/5ML IV SOLN
INTRAVENOUS | Status: DC
Start: 2015-11-15 — End: 2015-11-15
  Filled 2015-11-15: qty 5

## 2015-11-15 MED ORDER — HALOPERIDOL 5 MG PO TABS: 5.0000 mg | ORAL_TABLET | Freq: Every day | ORAL | Status: DC

## 2015-11-15 MED ORDER — POTASSIUM CHLORIDE CRYS ER 20 MEQ PO TBCR
40.0000 meq | EXTENDED_RELEASE_TABLET | Freq: Every day | ORAL | Status: DC
  Administered 2015-11-15: 40 meq via ORAL
  Filled 2015-11-15: qty 2

## 2015-11-15 MED ORDER — TECHNETIUM TC 99M TETROFOSMIN IV KIT
30.0000 | PACK | Freq: Once | INTRAVENOUS | Status: AC | PRN
  Administered 2015-11-15: 30 via INTRAVENOUS

## 2015-11-15 MED ORDER — POTASSIUM CHLORIDE CRYS ER 20 MEQ PO TBCR: 40.0000 meq | EXTENDED_RELEASE_TABLET | Freq: Every day | ORAL | 0 refills | Status: DC

## 2015-11-15 MED ORDER — REGADENOSON 0.4 MG/5ML IV SOLN
0.4000 mg | Freq: Once | INTRAVENOUS | Status: AC
  Administered 2015-11-15: 0.4 mg via INTRAVENOUS
  Filled 2015-11-15: qty 5

## 2015-11-15 MED ORDER — ONDANSETRON HCL 4 MG/2ML IJ SOLN: 4.0000 mg | Freq: Three times a day (TID) | INTRAMUSCULAR | Status: DC | PRN

## 2015-11-15 NOTE — ED Notes (Signed)
Report was called to 2W by Kathie Rhodes RN  Patient stable and ready for transport at this time. EMT will transport to 2W

## 2015-11-15 NOTE — Progress Notes (Signed)
Patient in a stable condition, this RN went over discharge teachings with patient and mother at bedside, they verbalised understanding, patient belongings at bedside, iv removed, tele dc ccmd notified, patient belongings at bedside, patient taken off the unit on a wheelchair by a NT

## 2015-11-15 NOTE — Discharge Summary (Signed)
Physician Discharge Summary  [name] [name] [id number] DOB: 1965-09-08 DOA: 11/14/2015  PCP: Odette Fraction, MD  Admit date: 11/14/2015 Discharge date: 11/15/2015  Time spent: 30 minutes  Recommendations for Outpatient Follow-up:  1. Repeat BMET to follow electrolytes and renal function   Discharge Diagnoses:  Principal Problem:   Chest pain Active Problems:   Rash   Chronic pain syndrome   Esophageal reflux   Depression HTN GERD Obesity   Discharge Condition: stable and improved. Discharge home with instructions to follow up with PCP and dermatology service as an outpatient.  Diet recommendation: heart healthy and low calorie diet   Filed Weights   11/15/15 0120  Weight: 101.8 kg (224 lb 6.4 oz)    History of present illness:  50 y.o. female with medical history significant of HTN, HLD.  Patient presents to the ED with c/o CP with SOB.  Symptoms onset 1 week ago while at rest but is worse today so she presents to ED for evaluation.  CP is "tightness" quality, located in mid sternum.  No worsening with movement.  Has family history of early MI.  No personal CAD history.  Has been in brace on R leg for 3 weeks after fall.  Also reports 1 day history of rash.  Hospital Course:  1-chest pain: -cardiac enzymes neg X3 -no acute ischemic changes on EKG -CTA w/o PNA and no signs of PE -Nuclear myoview low risk probability  -continue PPI -heart healthy diet recommended  2-LE rash: -most likely secondary to use of bactrim -patient instructed to follow up with dermatologist as an outpatient  -no signs of infection   3-HTN: -will continue home antihypertensive regimen -advise to follow low sodium diet  4-depression: -continue celexa  5-chronic pain syndrome -will continue home analgesics regimen  6-GERD: -continue PPI  7-Obesity -Body mass index is 38.52 kg/m. -low calorie diet and in crease exercise discussed with patient   8-hypokalemia: -repleted  and patient discharge on maintenance supplementation   Procedures:  Nuclear stress test IMPRESSION: 1. No reversible ischemia or infarction. 2. Normal left ventricular wall motion. 3. Left ventricular ejection fraction 75% 4. Non invasive risk stratification*: Low  Consultations:  None   Discharge Exam: Vitals:   11/15/15 1201 11/15/15 1401  BP: 117/64 (!) 99/57  Pulse: 83 81  Resp: 18 18  Temp: 97.9 F (36.6 C) 98.1 F (36.7 C)    General: afebrile, no CP and no SOB currently. Patient also denies nausea and vomiting. Cardiovascular: S1 and S2, no rubs. No gallops  Respiratory: CT bilaterally Abd: obese, soft, NT, ND, positive BS Extremities: non blenching rash, trace to 1+ edema bilaterally Neuro: non focal deficit.  Discharge Instructions   Discharge Instructions    Call MD for:  difficulty breathing, headache or visual disturbances    Complete by:  As directed   Diet - low sodium heart healthy    Complete by:  As directed   Discharge instructions    Complete by:  As directed   Follow a heart healthy diet (less than 2.4 gram of sodium daily) Take medications as prescribed Maintain adequate hydration Arrange follow up with PCP in 1 week Arrange visit and See a dermatologist next week   Increase activity slowly    Complete by:  As directed     Current Discharge Medication List    START taking these medications   Details  potassium chloride SA (K-DUR,KLOR-CON) 20 MEQ tablet Take 2 tablets (40 mEq total) by mouth daily. Qty:  60 tablet, Refills: 0      CONTINUE these medications which have CHANGED   Details  haloperidol (HALDOL) 5 MG tablet Take 1 tablet (5 mg total) by mouth at bedtime. For psychosis      CONTINUE these medications which have NOT CHANGED   Details  AMITIZA 24 MCG capsule TAKE 1 CAPSULE TWICE A DAY WITH A MEAL Qty: 60 capsule, Refills: 5    benztropine (COGENTIN) 1 MG tablet Take 1 mg by mouth 2 (two) times daily.     Calcium  Carbonate-Vit D-Min (CALCIUM 1200 PO) Take 1 tablet by mouth daily.    citalopram (CELEXA) 40 MG tablet Take 40 mg by mouth at bedtime.     cyclobenzaprine (FLEXERIL) 10 MG tablet Take 10 mg by mouth 3 (three) times daily as needed for muscle spasms.    fluticasone (FLONASE) 50 MCG/ACT nasal spray Place 2 sprays into both nostrils daily. Qty: 1.5 g, Refills: 2   Associated Diagnoses: Other chronic sinusitis    gabapentin (NEURONTIN) 600 MG tablet Take 1 tablet (600 mg total) by mouth 3 (three) times daily. For anxiety and pain management. May have to stop to take ECT. Qty: 90 tablet, Refills: 0    hydrochlorothiazide (HYDRODIURIL) 25 MG tablet TAKE 1 TABLET EVERY DAY Qty: 90 tablet, Refills: 2    HYDROcodone-acetaminophen (NORCO) 10-325 MG per tablet Take 1 tablet by mouth every 8 (eight) hours.     hydrOXYzine (ATARAX/VISTARIL) 10 MG tablet Take 10 mg by mouth 3 (three) times daily.     Multiple Vitamins-Minerals (MULTIVITAMIN WITH MINERALS) tablet Take 1 tablet by mouth daily.    nitroGLYCERIN (NITROSTAT) 0.4 MG SL tablet Place 1 tablet (0.4 mg total) under the tongue every 5 (five) minutes as needed for chest pain. Qty: 30 tablet, Refills: 0    oxymorphone (OPANA) 10 MG tablet Take 10 mg by mouth 2 (two) times daily as needed for pain.    pantoprazole (PROTONIX) 40 MG tablet TAKE 1 TABLET BY MOUTH TWICE A DAY Qty: 180 tablet, Refills: 2    prazosin (MINIPRESS) 5 MG capsule Take 5 mg by mouth at bedtime.    PROAIR HFA 108 (90 BASE) MCG/ACT inhaler INHALE 2 PUFFS INTO THE LUNGS EVERY 4 HOURS AS NEEDED FOR WHEEZING Qty: 8.5 Inhaler, Refills: 3    promethazine (PHENERGAN) 12.5 MG tablet Take 1 tablet (12.5 mg total) by mouth every 8 (eight) hours as needed for nausea or vomiting. Qty: 20 tablet, Refills: 0    sulfamethoxazole-trimethoprim (BACTRIM DS,SEPTRA DS) 800-160 MG tablet Take 1 tablet by mouth 2 (two) times daily. Qty: 28 tablet, Refills: 0    SUMAtriptan (IMITREX) 100  MG tablet TAKE 1 TABLET (100 MG TOTAL) BY MOUTH EVERY 2 (TWO) HOURS AS NEEDED. MIGRANES Qty: 10 tablet, Refills: 2    traZODone (DESYREL) 100 MG tablet Take 100 mg by mouth at bedtime. Refills: 0    VOLTAREN 1 % GEL Apply 2 g topically daily as needed (for pain).        Allergies  Allergen Reactions  . Tetracyclines & Related Other (See Comments)    syncope  . Tramadol Other (See Comments)    seizures  . Ciprofloxacin Rash  . Codeine Itching  . Morphine And Related Other (See Comments)    Memory loss  . Penicillins Hives and Rash   Follow-up Information    PICKARD,WARREN TOM, MD. Schedule an appointment as soon as possible for a visit in 1 week(s).   Specialty:  Family Medicine  Contact information: [address] [place] [address] [phone]            The results of significant diagnostics from this hospitalization (including imaging, microbiology, ancillary and laboratory) are listed below for reference.    Significant Diagnostic Studies: Ct Angio Chest Pe W/cm &/or Wo Cm  Result Date: 11/14/2015 CLINICAL DATA:  50 year old female with shortness of breath and tachycardia EXAM: CT ANGIOGRAPHY CHEST WITH CONTRAST TECHNIQUE: Multidetector CT imaging of the chest was performed using the standard protocol during bolus administration of intravenous contrast. Multiplanar CT image reconstructions and MIPs were obtained to evaluate the vascular anatomy. CONTRAST:  80 cc Isovue 370 COMPARISON:  Chest radiograph dated 10/26/2014 and CT dated 10/26/2014 FINDINGS: Evaluation is limited due to streak artifact caused by metallic spinal hardware. There minimal bibasilar dependent atelectatic changes of the lungs. There is no focal consolidation, pleural effusion, or pneumothorax. The central airways are patent. The thoracic aorta appears unremarkable. The origins of the great vessels of the aortic arch appear patent. There is no CT evidence of pulmonary embolism. There is no  cardiomegaly or pericardial effusion. No hilar or mediastinal adenopathy. The esophagus is grossly unremarkable with no thyroid nodules identified. There is no axillary or supraclavicular adenopathy. The chest wall soft tissues appear unremarkable. There is multilevel degenerative changes of the spine. Posterior thoracic Harrington rod. Age-indeterminate T9 compression fracture with approximately 50% loss of vertebral body height and anterior wedging. The visualized upper abdomen appears unremarkable. Review of the MIP images confirms the above findings. IMPRESSION: No CT evidence of pulmonary embolism. Electronically Signed   By: Anner Crete M.D.   On: 11/14/2015 22:38  Nm Myocar Multi W/spect W/wall Motion / Ef  Result Date: 11/15/2015 CLINICAL DATA:  50 year old female with chest pain. Hypertension, shortness of breath and family history of coronary disease. EXAM: MYOCARDIAL IMAGING WITH SPECT (REST AND PHARMACOLOGIC-STRESS) GATED LEFT VENTRICULAR WALL MOTION STUDY LEFT VENTRICULAR EJECTION FRACTION TECHNIQUE: Standard myocardial SPECT imaging was performed after resting intravenous injection of 10 mCi Tc-3m tetrofosmin. Subsequently, intravenous infusion of Lexiscan was performed under the supervision of the Cardiology staff. At peak effect of the drug, 30 mCi Tc-76m tetrofosmin was injected intravenously and standard myocardial SPECT imaging was performed. Quantitative gated imaging was also performed to evaluate left ventricular wall motion, and estimate left ventricular ejection fraction. COMPARISON:  10/27/2014 nuclear medicine myocardial study FINDINGS: Perfusion: No decreased activity in the left ventricle on stress imaging to suggest reversible ischemia or infarction. Wall Motion: Normal left ventricular wall motion. No left ventricular dilation. Left Ventricular Ejection Fraction: 75 % End diastolic volume 78 ml End systolic volume 19 ml IMPRESSION: 1. No reversible ischemia or infarction. 2.  Normal left ventricular wall motion. 3. Left ventricular ejection fraction 75% 4. Non invasive risk stratification*: Low *2012 Appropriate Use Criteria for Coronary Revascularization Focused Update: J Am Coll Cardiol. [id number]. http://content.airportbarriers.com.aspx?articleid=1201161 Electronically Signed   By: Margarette Canada M.D.   On: 11/15/2015 11:53  Microbiology: No results found for this or any previous visit (from the past 240 hour(s)).   Labs: Basic Metabolic Panel:  Recent Labs Lab 11/14/15 2109 11/15/15 0541  NA 135 137  K 2.6* 3.4*  CL 103 105  CO2 24 25  GLUCOSE 108* 95  BUN <5* <5*  CREATININE 0.71 0.63  CALCIUM 8.8* 8.7*  MG 1.6*  --    CBC:  Recent Labs Lab 11/14/15 2109  WBC 5.6  HGB 11.6*  HCT 35.2*  MCV 94.4  PLT 281   Cardiac Enzymes:  Recent Labs Lab 11/15/15 0018 11/15/15 0249 11/15/15 0541  TROPONINI <0.03 <0.03 <0.03    Signed:  Barton Dubois MD.  [facility] 11/15/2015, 2:21 PM

## 2015-11-15 NOTE — ED Notes (Signed)
Patient has rash to the lower left leg with some red raised bumps.  Patient states those have been there for a day.  Now reporting new rash to the left wrist.  MD made aware

## 2015-11-15 NOTE — Progress Notes (Signed)
Patient ID: [name] [name], female   DOB: Aug 27, 1965, 50 y.o.   MRN: [id number]  Subjective: 50 year old female presents the office today for follow-up evaluation of infection of the right big toe. Since that she is continued do well Ms. redness and S1 to the toe is significantly improved. She states that her to go ahead and proceed with surgery to help straighten her toe to help prevent recurrence. She states that she's having no pain on that she does have neuropathy. Eyes any drainage or pus coming from the wound for her toe. Denies any systemic complaints such as fevers, chills, nausea, vomiting. No acute changes since last appointment, and no other complaints at this time.   Objective: AAO x3, NAD DP/PT pulses palpable bilaterally, CRT less than 3 seconds The right hallux has resolving edema and erythema. There is no edema today there is very faint tenderness to erythema to the toe. There is no ascending cellulitis. Hyperkeratotic lesion plantar aspect right hallux. Upon debridement no underlying ulceration already is pre-ulcerative. There is no fluctuance or crepitus there is no malodor. Hallux malleus is present No open lesions or pre-ulcerative lesions.  No pain with calf compression, swelling, warmth, erythema  Assessment: Right hallux resolving infections with ulceration  Plan: -All treatment options discussed with the patient including all alternatives, risks, complications.  -Keratotic lesion was debrided without, complications or bleeding. The wound appears to be healed he infection. 3 resolving. I will see her back in 2 weeks. At the foundry x-ray the toe. If symptoms continue to improve and she is having no signs or symptoms of infection I discussed with her hallux IPJ arthrodesis and a more rectus position to help eliminate future ulcerations and help prevent reoccurrence. She states that she were to have surgery to help with this however discussed that she is at high risk of  amputation of the toe if infection postoperatively. She understands this risk and she was directed to go ahead and proceed with surgery. We'll discuss further 2 weeks. -Patient encouraged to call the office with any questions, concerns, change in symptoms.   [name] [name], DPM

## 2015-11-17 ENCOUNTER — Other Ambulatory Visit: Payer: Self-pay | Admitting: Family Medicine

## 2015-11-17 DIAGNOSIS — M25561 Pain in right knee: Secondary | ICD-10-CM | POA: Diagnosis not present

## 2015-11-17 DIAGNOSIS — J328 Other chronic sinusitis: Secondary | ICD-10-CM

## 2015-11-17 NOTE — Telephone Encounter (Signed)
Refill appropriate and filled per protocol. 

## 2015-11-20 ENCOUNTER — Ambulatory Visit (INDEPENDENT_AMBULATORY_CARE_PROVIDER_SITE_OTHER): Payer: Medicare Other | Admitting: Family Medicine

## 2015-11-20 ENCOUNTER — Encounter: Payer: Self-pay | Admitting: Family Medicine

## 2015-11-20 VITALS — BP 126/80 | HR 68 | Temp 98.1°F | Resp 18 | Ht 63.5 in | Wt 221.0 lb

## 2015-11-20 DIAGNOSIS — R0681 Apnea, not elsewhere classified: Secondary | ICD-10-CM | POA: Diagnosis not present

## 2015-11-20 DIAGNOSIS — Z09 Encounter for follow-up examination after completed treatment for conditions other than malignant neoplasm: Secondary | ICD-10-CM | POA: Diagnosis not present

## 2015-11-20 DIAGNOSIS — E876 Hypokalemia: Secondary | ICD-10-CM

## 2015-11-20 LAB — CULTURE, BLOOD (ROUTINE X 2)
Culture: NO GROWTH
Culture: NO GROWTH

## 2015-11-20 LAB — MAGNESIUM: Magnesium: 1.9 mg/dL (ref 1.5–2.5)

## 2015-11-20 LAB — BASIC METABOLIC PANEL WITH GFR
BUN: 10 mg/dL (ref 7–25)
CO2: 24 mmol/L (ref 20–31)
Calcium: 9.2 mg/dL (ref 8.6–10.4)
Chloride: 105 mmol/L (ref 98–110)
Creat: 0.73 mg/dL (ref 0.50–1.05)
GFR, Est African American: >89 mL/min (ref >=60)
GFR, Est Non African American: >89 mL/min (ref >=60)
Glucose, Bld: 75 mg/dL (ref 70–99)
Potassium: 4 mmol/L (ref 3.5–5.3)
Sodium: 139 mmol/L (ref 135–146)

## 2015-11-20 NOTE — Progress Notes (Signed)
Subjective:    Patient ID: [name] [name], female    DOB: [birth year], 50 y.o.   MRN: [id number]  HPI Patient was recently admitted to the hospital with atypical chest pain. CT angiogram was negative for PE. Cardiac enzymes were negative 3. Myoview stress test was negative for ischemia. Patient also reported tachycardia and palpitations with a heart rate is file high as 130 bpm. She was found to be profoundly hypokalemic with potassium 2.6. Her symptoms improved with potassium repletion. She is on hydrochlorothiazide. Her mother is also concerned that she is having witnessed apnea episodes at night. Her mother will find her not breathing or gasping for air in the middle of the night while she is sleeping. The patient has always complained of fatigue and hypersomnia. They're requesting a sleep study. The patient is on potassium supplementation at the present time Past Medical History:  Diagnosis Date  . Anxiety   . Asthma   . Bipolar 1 disorder ([place])    ect treatments last treatment Sep 02 1011  . Depression   . GERD (gastroesophageal reflux disease)   . Hypertension   . Seizures ([place])    last seizure was so long ago she can't remember.   . Sleep apnea    Past Surgical History:  Procedure Laterality Date  . ABDOMINAL HYSTERECTOMY    . BACK SURGERY    . Laproscopic knee surgery     Current Outpatient Prescriptions on File Prior to Visit  Medication Sig Dispense Refill  . AMITIZA 24 MCG capsule TAKE 1 CAPSULE TWICE A DAY WITH A MEAL 60 capsule 5  . benztropine (COGENTIN) 1 MG tablet Take 1 mg by mouth 2 (two) times daily.     . Calcium Carbonate-Vit D-Min (CALCIUM 1200 PO) Take 1 tablet by mouth daily.    . citalopram (CELEXA) 40 MG tablet Take 40 mg by mouth at bedtime.     . cyclobenzaprine (FLEXERIL) 10 MG tablet Take 10 mg by mouth 3 (three) times daily as needed for muscle spasms.    . fluticasone (FLONASE) 50 MCG/ACT nasal spray PLACE 2 SPRAYS INTO BOTH NOSTRILS DAILY. 16 g 2    . gabapentin (NEURONTIN) 600 MG tablet Take 1 tablet (600 mg total) by mouth 3 (three) times daily. For anxiety and pain management. May have to stop to take ECT. 90 tablet 0  . haloperidol (HALDOL) 5 MG tablet Take 1 tablet (5 mg total) by mouth at bedtime. For psychosis    . hydrochlorothiazide (HYDRODIURIL) 25 MG tablet TAKE 1 TABLET EVERY DAY 90 tablet 2  . HYDROcodone-acetaminophen (NORCO) 10-325 MG per tablet Take 1 tablet by mouth every 8 (eight) hours.     . hydrOXYzine (ATARAX/VISTARIL) 10 MG tablet Take 10 mg by mouth 3 (three) times daily.     . Multiple Vitamins-Minerals (MULTIVITAMIN WITH MINERALS) tablet Take 1 tablet by mouth daily.    . nitroGLYCERIN (NITROSTAT) 0.4 MG SL tablet Place 1 tablet (0.4 mg total) under the tongue every 5 (five) minutes as needed for chest pain. 30 tablet 0  . oxymorphone (OPANA) 10 MG tablet Take 10 mg by mouth 2 (two) times daily as needed for pain.    . pantoprazole (PROTONIX) 40 MG tablet TAKE 1 TABLET BY MOUTH TWICE A DAY 180 tablet 2  . potassium chloride SA (K-DUR,KLOR-CON) 20 MEQ tablet Take 2 tablets (40 mEq total) by mouth daily. 60 tablet 0  . prazosin (MINIPRESS) 5 MG capsule Take 5 mg by mouth at bedtime.    Marland Kitchen  PROAIR HFA 108 (90 BASE) MCG/ACT inhaler INHALE 2 PUFFS INTO THE LUNGS EVERY 4 HOURS AS NEEDED FOR WHEEZING 8.5 Inhaler 3  . promethazine (PHENERGAN) 12.5 MG tablet Take 1 tablet (12.5 mg total) by mouth every 8 (eight) hours as needed for nausea or vomiting. 20 tablet 0  . sulfamethoxazole-trimethoprim (BACTRIM DS,SEPTRA DS) 800-160 MG tablet Take 1 tablet by mouth 2 (two) times daily. 28 tablet 0  . SUMAtriptan (IMITREX) 100 MG tablet TAKE 1 TABLET (100 MG TOTAL) BY MOUTH EVERY 2 (TWO) HOURS AS NEEDED. MIGRANES 10 tablet 2  . traZODone (DESYREL) 100 MG tablet Take 100 mg by mouth at bedtime.  0  . VOLTAREN 1 % GEL Apply 2 g topically daily as needed (for pain).     . [DISCONTINUED] lithium carbonate (LITHOBID) 300 MG CR tablet Take 1  tablet (300 mg total) by mouth every 12 (twelve) hours. For mood swings and depression 60 tablet 0   No current facility-administered medications on file prior to visit.    Allergies  Allergen Reactions  . Tetracyclines & Related Other (See Comments)    syncope  . Tramadol Other (See Comments)    seizures  . Ciprofloxacin Rash  . Codeine Itching  . Morphine And Related Other (See Comments)    Memory loss  . Penicillins Hives and Rash   Social History   Social History  . Marital status: Legally Separated    Spouse name: N/A  . Number of children: N/A  . Years of education: N/A   Occupational History  . Not on file.   Social History Main Topics  . Smoking status: Never Smoker  . Smokeless tobacco: Never Used  . Alcohol use No  . Drug use: No  . Sexual activity: No   Other Topics Concern  . Not on file   Social History Narrative  . No narrative on file   Family History  Problem Relation Age of Onset  . Diabetes Mother   . COPD Mother   . Hypertension Mother   . Hyperlipidemia Mother   . Heart disease Father   . Hyperlipidemia Father   . Hypertension Father   . Heart disease Brother   . Heart disease Daughter       Review of Systems  All other systems reviewed and are negative.      Objective:   Physical Exam  Constitutional: She is oriented to person, place, and time. She appears well-developed and well-nourished. No distress.  HENT:  Head: Normocephalic and atraumatic.  Right Ear: External ear normal.  Left Ear: External ear normal.  Nose: Nose normal.  Mouth/Throat: Oropharynx is clear and moist. No oropharyngeal exudate.  Eyes: Conjunctivae and EOM are normal. Pupils are equal, round, and reactive to light. Right eye exhibits no discharge. Left eye exhibits no discharge. No scleral icterus.  Neck: Normal range of motion. Neck supple. No JVD present. No tracheal deviation present. No thyromegaly present.  Cardiovascular: Normal rate, regular  rhythm, normal heart sounds and intact distal pulses.  Exam reveals no gallop and no friction rub.   No murmur heard. Pulmonary/Chest: Effort normal and breath sounds normal. No stridor. No respiratory distress. She has no wheezes. She has no rales. She exhibits no tenderness.  Abdominal: Soft. Bowel sounds are normal. She exhibits no distension and no mass. There is no tenderness. There is no rebound and no guarding.  Musculoskeletal: Normal range of motion. She exhibits no edema or tenderness.  Lymphadenopathy:    She has  no cervical adenopathy.  Neurological: She is alert and oriented to person, place, and time. She has normal reflexes. No cranial nerve deficit. She exhibits normal muscle tone. Coordination normal.  Skin: Skin is warm. No rash noted. She is not diaphoretic. No erythema. No pallor.  Psychiatric: She has a normal mood and affect. Her behavior is normal. Judgment and thought content normal.  Vitals reviewed.         Assessment & Plan:  Hypokalemia - Plan: BASIC METABOLIC PANEL WITH GFR, Magnesium  Hospital discharge follow-up  Apnea spell - Plan: Split night study Tachycardia and palpitations could've been due to hypokalemia versus a panic attack. Check BMP as well as magnesium level. If potassium is low again today, I would discontinue hydrochlorothiazide and replaced with a different blood pressure medication. Given the witnessed apneic episodes at home, her hypersomnia, and her body habitus, I do recommend a split-level sleep study which I will schedule for her

## 2015-11-24 ENCOUNTER — Telehealth: Payer: Self-pay | Admitting: *Deleted

## 2015-11-24 MED ORDER — SULFAMETHOXAZOLE-TRIMETHOPRIM 800-160 MG PO TABS: 1.0000 | ORAL_TABLET | Freq: Two times a day (BID) | ORAL | 0 refills | Status: DC

## 2015-11-24 MED ORDER — SULFAMETHOXAZOLE-TRIMETHOPRIM 400-80 MG PO TABS: 1.0000 | ORAL_TABLET | Freq: Two times a day (BID) | ORAL | 0 refills | Status: DC

## 2015-11-24 NOTE — Telephone Encounter (Signed)
Please refill Bactrim DS 1 tab PO BID

## 2015-11-24 NOTE — Telephone Encounter (Signed)
Pt states she Dr. Jacqualyn Posey had wanted her to be on the Bactrim until the next visit.

## 2015-11-25 ENCOUNTER — Ambulatory Visit: Payer: Medicare Other | Attending: Orthopedic Surgery | Admitting: Physical Therapy

## 2015-11-25 ENCOUNTER — Encounter: Payer: Self-pay | Admitting: Family Medicine

## 2015-11-25 ENCOUNTER — Ambulatory Visit (INDEPENDENT_AMBULATORY_CARE_PROVIDER_SITE_OTHER): Payer: Medicare Other | Admitting: Family Medicine

## 2015-11-25 VITALS — BP 118/80 | HR 85 | Resp 18 | Wt 223.0 lb

## 2015-11-25 DIAGNOSIS — R21 Rash and other nonspecific skin eruption: Secondary | ICD-10-CM | POA: Diagnosis not present

## 2015-11-25 DIAGNOSIS — M25661 Stiffness of right knee, not elsewhere classified: Secondary | ICD-10-CM | POA: Diagnosis not present

## 2015-11-25 DIAGNOSIS — M6281 Muscle weakness (generalized): Secondary | ICD-10-CM | POA: Insufficient documentation

## 2015-11-25 DIAGNOSIS — R262 Difficulty in walking, not elsewhere classified: Secondary | ICD-10-CM | POA: Diagnosis not present

## 2015-11-25 DIAGNOSIS — M25561 Pain in right knee: Secondary | ICD-10-CM | POA: Diagnosis not present

## 2015-11-25 NOTE — Patient Instructions (Signed)
   Quad Set    With other leg bent, foot flat, slowly tighten muscles on thigh of straight leg while counting out loud to 5____. Repeat with other leg. Repeat __10-20__ times. Do __2  sessions per day.  http://gt2.exer.us/276   Copyright  VHI. All rights reserved.  HIP: Hamstrings - Short Sitting    Rest leg on raised surface. Keep knee straight. Lift chest. Hold _30__ seconds. _2-3__ reps per set, _2__ sets per day, __5-7_ days per week  Copyright  VHI. All rights reserved.  Hamstring Stretch - Supine    Lie on back, uninvolved leg raised toward ceiling, towel around knee. Pull thigh toward chest until a stretch is felt on back of thigh. Hold for __30_ seconds. If possible, move towel up to calf and repeat. Repeat on involved leg. Repeat _3__ times. Do __2_ times per day.  Copyright  VHI. All rights reserved.    Hip Flexion / Knee Extension: Straight-Leg Raise (Eccentric)    Lie on back. Lift leg with knee straight. Slowly lower leg for 3-5 seconds. _10__ reps per set, __2_ sets per day, _5-7__ days per week. Lower like elevator, stopping at each floor. Copyright  VHI. All rights reserved.

## 2015-11-25 NOTE — Progress Notes (Signed)
Subjective:    Patient ID: [name] [name], female    DOB: Feb 06, 1966, 49 y.o.   MRN: [id number]  HPI Patient presents today asking me to do a biopsy of the lesion on her right lower leg. There is a group of approximately 4 per Mardene Celeste macules on the lateral surface of her right shin 6 cm above her ankle seemed to be coalescent patches of small petechiae. They're not palpable. They're not warm. They're not infected. There is no other lesions anywhere else on her right leg. She has 3 similar faded lesions on the anterior surface of her left shin. I believe these are petechiae related to venous stasis disease and leg swelling and dependent edema. She is concerned that it could be a drug eruption. She is on chronic Bactrim because of an infection in her toe. There is no other rash anywhere else on the body. Her arms chest up her legs and torso are clear. She denies any symptoms of vasculitis. Rash is been present for several weeks and is not spreading Past Medical History:  Diagnosis Date  . Anxiety   . Asthma   . Bipolar 1 disorder ([place])    ect treatments last treatment Sep 02 1011  . Depression   . GERD (gastroesophageal reflux disease)   . Hypertension   . Seizures ([place])    last seizure was so long ago she can't remember.   . Sleep apnea    Past Surgical History:  Procedure Laterality Date  . ABDOMINAL HYSTERECTOMY    . BACK SURGERY    . Laproscopic knee surgery     Current Outpatient Prescriptions on File Prior to Visit  Medication Sig Dispense Refill  . AMITIZA 24 MCG capsule TAKE 1 CAPSULE TWICE A DAY WITH A MEAL 60 capsule 5  . benztropine (COGENTIN) 1 MG tablet Take 1 mg by mouth 2 (two) times daily.     . Calcium Carbonate-Vit D-Min (CALCIUM 1200 PO) Take 1 tablet by mouth daily.    . citalopram (CELEXA) 40 MG tablet Take 40 mg by mouth at bedtime.     . cyclobenzaprine (FLEXERIL) 10 MG tablet Take 10 mg by mouth 3 (three) times daily as needed for muscle spasms.    .  fluticasone (FLONASE) 50 MCG/ACT nasal spray PLACE 2 SPRAYS INTO BOTH NOSTRILS DAILY. 16 g 2  . gabapentin (NEURONTIN) 600 MG tablet Take 1 tablet (600 mg total) by mouth 3 (three) times daily. For anxiety and pain management. May have to stop to take ECT. 90 tablet 0  . haloperidol (HALDOL) 5 MG tablet Take 1 tablet (5 mg total) by mouth at bedtime. For psychosis    . hydrochlorothiazide (HYDRODIURIL) 25 MG tablet TAKE 1 TABLET EVERY DAY 90 tablet 2  . HYDROcodone-acetaminophen (NORCO) 10-325 MG per tablet Take 1 tablet by mouth every 8 (eight) hours.     . hydrOXYzine (ATARAX/VISTARIL) 10 MG tablet Take 10 mg by mouth 3 (three) times daily.     . Multiple Vitamins-Minerals (MULTIVITAMIN WITH MINERALS) tablet Take 1 tablet by mouth daily.    . nitroGLYCERIN (NITROSTAT) 0.4 MG SL tablet Place 1 tablet (0.4 mg total) under the tongue every 5 (five) minutes as needed for chest pain. 30 tablet 0  . oxymorphone (OPANA) 10 MG tablet Take 10 mg by mouth 2 (two) times daily as needed for pain.    . pantoprazole (PROTONIX) 40 MG tablet TAKE 1 TABLET BY MOUTH TWICE A DAY 180 tablet 2  . potassium  chloride SA (K-DUR,KLOR-CON) 20 MEQ tablet Take 2 tablets (40 mEq total) by mouth daily. 60 tablet 0  . prazosin (MINIPRESS) 5 MG capsule Take 5 mg by mouth at bedtime.    Marland Kitchen PROAIR HFA 108 (90 BASE) MCG/ACT inhaler INHALE 2 PUFFS INTO THE LUNGS EVERY 4 HOURS AS NEEDED FOR WHEEZING 8.5 Inhaler 3  . promethazine (PHENERGAN) 12.5 MG tablet Take 1 tablet (12.5 mg total) by mouth every 8 (eight) hours as needed for nausea or vomiting. 20 tablet 0  . sulfamethoxazole-trimethoprim (BACTRIM DS,SEPTRA DS) 800-160 MG tablet Take 1 tablet by mouth 2 (two) times daily. 28 tablet 0  . SUMAtriptan (IMITREX) 100 MG tablet TAKE 1 TABLET (100 MG TOTAL) BY MOUTH EVERY 2 (TWO) HOURS AS NEEDED. MIGRANES 10 tablet 2  . traZODone (DESYREL) 100 MG tablet Take 100 mg by mouth at bedtime.  0  . VOLTAREN 1 % GEL Apply 2 g topically daily as  needed (for pain).     . [DISCONTINUED] lithium carbonate (LITHOBID) 300 MG CR tablet Take 1 tablet (300 mg total) by mouth every 12 (twelve) hours. For mood swings and depression 60 tablet 0   No current facility-administered medications on file prior to visit.    Allergies  Allergen Reactions  . Tetracyclines & Related Other (See Comments)    syncope  . Tramadol Other (See Comments)    seizures  . Ciprofloxacin Rash  . Codeine Itching  . Morphine And Related Other (See Comments)    Memory loss  . Penicillins Hives and Rash   Social History   Social History  . Marital status: Legally Separated    Spouse name: N/A  . Number of children: N/A  . Years of education: N/A   Occupational History  . Not on file.   Social History Main Topics  . Smoking status: Never Smoker  . Smokeless tobacco: Never Used  . Alcohol use No  . Drug use: No  . Sexual activity: No   Other Topics Concern  . Not on file   Social History Narrative  . No narrative on file      Review of Systems  All other systems reviewed and are negative.      Objective:   Physical Exam  Cardiovascular: Normal rate and regular rhythm.   Pulmonary/Chest: Effort normal and breath sounds normal.  Skin: Rash noted.   Exam was limited to visualization of the rash on her legs and then approximately 15 minutes was spent in discussion       Assessment & Plan:  I believe the rash represents large petechiae and groups of petechiae due to venous stasis changes in her legs and pitting edema in her legs. I have recommended support hose over-the-counter for the next 3-4 weeks to see if the rash will improve. It is not itching. It is not raised or palpable. Therefore I would monitor this. I believe the likelihood of a drug eruption is very low as this seems very isolated and there is no other evidence of any spread. If the rash persists but is really proceed with a biopsy. However I hesitate to perform biopsy today  because of believe biopsying and leaving a sore on her leg in that area will be slow to heal and prone to infection.

## 2015-11-25 NOTE — Therapy (Signed)
[place], [place], [address] Phone: [phone]   Fax:  [phone]  Physical Therapy Evaluation  Patient Details  Name: [name] [name] MRN: [id number] Date of Birth: Oct 19, 1965 Referring Provider: French Ana  Encounter Date: 11/25/2015      PT End of Session - 11/25/15 1305    Visit Number 1   Number of Visits 16   Date for PT Re-Evaluation 01/20/16   PT Start Time [id number]   PT Stop Time 1059   PT Time Calculation (min) 44 min   Activity Tolerance Patient tolerated treatment well   Behavior During Therapy [facility] for tasks assessed/performed      Past Medical History:  Diagnosis Date  . Anxiety   . Asthma   . Bipolar 1 disorder ([place])    ect treatments last treatment Sep 02 1011  . Depression   . GERD (gastroesophageal reflux disease)   . Hypertension   . Seizures ([place])    last seizure was so long ago she can't remember.   . Sleep apnea     Past Surgical History:  Procedure Laterality Date  . ABDOMINAL HYSTERECTOMY    . BACK SURGERY    . Laproscopic knee surgery      There were no vitals filed for this visit.       Subjective Assessment - 11/25/15 1023    Subjective Pt dislocated patella mid June (knee hit the bumper of the car and went laterally).  She report the knee cap "popped back in" immediately.  Wears brace.  Still has instability in her Rt. knee and swelling, pain.  She feels like her knee hyperextendings in standing, walking.     Patient is accompained by: Family member   Pertinent History recent ED and hospital admission , 5 thoraco-lumbar fusions, multiple knee surgeries (3 on each)    Limitations Lifting;Standing;Walking;Sitting;House hold activities   How long can you sit comfortably? has some trouble with sit to stand (able to do in clinic).  Pt has a lift chair that was her dad's    How long can you stand comfortably? 5 min    How long can you walk comfortably? < 5 min    Diagnostic tests MRi done but not available.  No tears.  DJD.    Patient Stated Goals Patient would like to be more mobile and have less pain.    Currently in Pain? Yes   Pain Score 6   not medicated   Pain Location Knee   Pain Orientation Right   Pain Descriptors / Indicators Burning;Sharp   Pain Type Chronic pain   Pain Radiating Towards no   Pain Onset More than a month ago   Pain Frequency Intermittent   Aggravating Factors  weightbearing, walking    Pain Relieving Factors ice, meds, brace    Effect of Pain on Daily Activities makes all aspects of mobility difficult             [facility] PT Assessment - 11/25/15 1028      Assessment   Medical Diagnosis patellar dislocation    Referring Provider Caffrey   Onset Date/Surgical Date 10/10/15   Prior Therapy in the past for knees      Precautions   Precautions None     Restrictions   Weight Bearing Restrictions No     Balance Screen   Has the patient fallen in the past 6 months Yes   How many times? 3  has vertigo, brace  for toe caught something and she fell    Has the patient had a decrease in activity level because of a fear of falling?  Yes   Is the patient reluctant to leave their home because of a fear of falling?  No     Home Ecologist residence   Living Arrangements Parent   Type of [place] - single point;Other (comment)   Additional Comments lift chair      Prior Function   Level of Independence Independent   Vocation On disability   Leisure church, family     Cognition   Overall Cognitive Status Within Functional Limits for tasks assessed     Observation/Other Assessments   Focus on Therapeutic Outcomes (FOTO)  68%     Circumferential Edema   Circumferential - Right 15.5  equal suprapatellar as well    Circumferential - Left  15.5      Sensation   Light Touch Appears Intact      Coordination   Gross Motor Movements are Fluid and Coordinated Not tested     Squat   Comments difficult, min pain in back  (upper)     Step Up   Comments min difficulty 6 inch      Step Down   Comments min diff, pain 6 inch      Single Leg Stance   Comments < 5 sec bilat.      Sit to Stand   Comments [facility]      Posture/Postural Control   Posture/Postural Control Postural limitations   Postural Limitations Rounded Shoulders;Forward head;Decreased lumbar lordosis;Decreased thoracic kyphosis   Posture Comments knees flexed in stance slightly      AROM   Right Knee Extension 10   Right Knee Flexion 110     PROM   PROM Assessment Site --  pain with passive knee ext R, to 0 deg     Strength   Right Hip Flexion 4+/5   Right Hip ABduction 4-/5   Left Hip Flexion 4+/5   Left Hip ABduction 4+/5   Right Knee Flexion 5/5   Right Knee Extension 4+/5   Left Knee Flexion 4+/5   Left Knee Extension 4/5   Right Ankle Dorsiflexion 5/5   Left Ankle Dorsiflexion 3/5     Flexibility   Hamstrings 90/90 lacks 46 on Rt. , lacks 30 deg on L.      Palpation   Patella mobility pain             PT Education - 11/25/15 1304    Education provided Yes   Education Details PT/POC, HEP and ROM of knee    Person(s) Educated Patient   Methods Explanation;Demonstration;Handout   Comprehension Verbalized understanding;Returned demonstration;Need further instruction          PT Short Term Goals - 11/25/15 1318      PT SHORT TERM GOAL #1   Title Pt will be I with HEP for knee ROM and strength    Time 4   Period Weeks   Status New     PT SHORT TERM GOAL #2   Title Pt will be able to walk with improved heel strike, less feeling of instability.    Time 4   Period Weeks   Status New           PT Long Term Goals - 11/25/15 1330  PT LONG TERM GOAL #1   Title Pt will score <55 % impaired on FOTO to demo functional improvement.    Time 8   Period Weeks   Status New      PT LONG TERM GOAL #2   Title Pt will understand RICE and self care for pain, swelling.    Time 8   Period Weeks   Status New     PT LONG TERM GOAL #3   Title Pt will be I with HEP at DC.    Time 8   Period Weeks   Status New     PT LONG TERM GOAL #4   Title Pt will be able to walk for up to 20 min comfortably without brace.     Time 8   Period Weeks   Status New               Plan - 12/01/2015 1306    Clinical Impression Statement Patient presents with low complexity eval for recent patellar dislocation.  She has tightness in hips, hamstrings, mild LE weakness and abnormal gait.  Other orthopedic issues may hinder her progress.     Rehab Potential Good   PT Frequency 2x / week   PT Duration 8 weeks   PT Treatment/Interventions ADLs/Self Care Home Management;Moist Heat;Therapeutic activities;Therapeutic exercise;Ultrasound;Taping;Gait training;Functional mobility training;Electrical Stimulation;Cryotherapy;Neuromuscular re-education;Passive range of motion;Manual techniques;Balance training;Vasopneumatic Device   PT Next Visit Plan check HEP, Nustep, knee ROM and strength   PT Home Exercise Plan hamstring stretch and quad (SLR, set)    Consulted and Agree with Plan of Care Patient      Patient will benefit from skilled therapeutic intervention in order to improve the following deficits and impairments:  Abnormal gait, Decreased range of motion, Difficulty walking, Increased fascial restricitons, Obesity, Decreased endurance, Decreased activity tolerance, Pain, Improper body mechanics, Impaired flexibility, Hypomobility, Decreased balance, Decreased strength, Decreased mobility, Postural dysfunction, Increased edema  Visit Diagnosis: Stiffness of right knee, not elsewhere classified  Muscle weakness (generalized)  Pain in right knee  Difficulty in walking      G-Codes - 2015/12/01 1316    Functional Assessment Tool Used FOTO   Functional Limitation Mobility: Walking  and moving around   Mobility: Walking and Moving Around Current Status [phone]) At least 60 percent but less than 80 percent impaired, limited or restricted   Mobility: Walking and Moving Around Goal Status [phone]) At least 40 percent but less than 60 percent impaired, limited or restricted       Problem List Patient Active Problem List   Diagnosis Date Noted  . 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 Chronic pain syndrome   . Esophageal reflux   . Depression   . Rash 11/14/2015  . Cellulitis of great toe of right foot 08/12/2015  . Toe ulcer, right ([place]) 04/11/2015  . Cellulitis of toe of right foot 04/11/2015  . Nonspecific chest pain   . Chest pain 10/26/2014  . Morbid obesity due to excess calories ([place]) 10/26/2014  . Hypokalemia 10/26/2014  . Manic bipolar I disorder ([place]) 10/26/2014  . Atypical angina ([place]) 10/26/2014  . Manic bipolar I disorder with rapid cycling ([place]) 05/18/2011    Class: Acute    PAA,JENNIFER Dec 01, 2015, 3:23 PM  [facility] [address] [place], [place], [address] Phone: [phone]   Fax:  [phone]  Name: [name] [name] MRN: [id number] Date of Birth: 09-24-1965   Raeford Razor, PT 12/01/15 3:24 PM Phone: [phone] Fax: [phone]

## 2015-11-27 DIAGNOSIS — G894 Chronic pain syndrome: Secondary | ICD-10-CM | POA: Diagnosis not present

## 2015-11-27 DIAGNOSIS — M4325 Fusion of spine, thoracolumbar region: Secondary | ICD-10-CM | POA: Diagnosis not present

## 2015-11-27 DIAGNOSIS — M25561 Pain in right knee: Secondary | ICD-10-CM | POA: Diagnosis not present

## 2015-11-27 DIAGNOSIS — M791 Myalgia: Secondary | ICD-10-CM | POA: Diagnosis not present

## 2015-12-02 ENCOUNTER — Encounter: Payer: Medicare Other | Admitting: Physical Therapy

## 2015-12-04 ENCOUNTER — Ambulatory Visit: Payer: Medicare Other | Admitting: Physical Therapy

## 2015-12-04 DIAGNOSIS — M25661 Stiffness of right knee, not elsewhere classified: Secondary | ICD-10-CM | POA: Diagnosis not present

## 2015-12-04 DIAGNOSIS — M25561 Pain in right knee: Secondary | ICD-10-CM

## 2015-12-04 DIAGNOSIS — M6281 Muscle weakness (generalized): Secondary | ICD-10-CM | POA: Diagnosis not present

## 2015-12-04 DIAGNOSIS — R262 Difficulty in walking, not elsewhere classified: Secondary | ICD-10-CM

## 2015-12-04 NOTE — Therapy (Signed)
[place], [place], [address] Phone: [phone]   Fax:  [phone]  Physical Therapy Treatment  Patient Details  Name: [name] [name] MRN: [id number] Date of Birth: 1965-07-10 Referring Provider: French Ana  Encounter Date: 12/04/2015      PT End of Session - 12/04/15 1021    Visit Number 2   Number of Visits 16   Date for PT Re-Evaluation 01/20/16   PT Start Time 1009   PT Stop Time 1107   PT Time Calculation (min) 58 min   Activity Tolerance Patient tolerated treatment well   Behavior During Therapy [facility] for tasks assessed/performed      Past Medical History:  Diagnosis Date  . Anxiety   . Asthma   . Bipolar 1 disorder ([place])    ect treatments last treatment Sep 02 1011  . Depression   . GERD (gastroesophageal reflux disease)   . Hypertension   . Seizures ([place])    last seizure was so long ago she can't remember.   . Sleep apnea     Past Surgical History:  Procedure Laterality Date  . ABDOMINAL HYSTERECTOMY    . BACK SURGERY    . Laproscopic knee surgery      There were no vitals filed for this visit.      Subjective Assessment - 12/04/15 1009    Subjective Hurting today, 5/10.  See previous for pain assessment.  Pt reports her HEP "never making it home" she lost them.              [facility] PT Assessment - 12/04/15 1042      Berg Balance Test   Sit to Stand Able to stand without using hands and stabilize independently   Standing Unsupported Able to stand safely 2 minutes   Sitting with Back Unsupported but Feet Supported on Floor or Stool Able to sit safely and securely 2 minutes   Stand to Sit Sits safely with minimal use of hands   Transfers Able to transfer safely, minor use of hands   Standing Unsupported with Eyes Closed Able to stand 10 seconds safely   Standing Ubsupported with Feet Together Able to place feet together independently and stand 1 minute safely   From  Standing, Reach Forward with Outstretched Arm Can reach forward >12 cm safely (5")   From Standing Position, Pick up Object from Floor Able to pick up shoe safely and easily   From Standing Position, Turn to Look Behind Over each Shoulder Looks behind from both sides and weight shifts well   Turn 360 Degrees Able to turn 360 degrees safely one side only in 4 seconds or less   Standing Unsupported, Alternately Place Feet on Step/Stool Able to stand independently and safely and complete 8 steps in 20 seconds   Standing Unsupported, One Foot in Front Able to plae foot ahead of the other independently and hold 30 seconds   Standing on One Leg Tries to lift leg/unable to hold 3 seconds but remains standing independently   Total Score 50   Berg comment: cane in community, uses most of the time                      [facility] Adult PT Treatment/Exercise - 12/04/15 1014      Knee/Hip Exercises: Stretches   Active Hamstring Stretch 3 reps;30 seconds     Knee/Hip Exercises: Aerobic   Nustep L4 UE and LE for 5 min  for AROM      Knee/Hip Exercises: Supine   Quad Sets AAROM;Strengthening;Both;1 set;20 reps   Bridges Strengthening;Both;1 set   Straight Leg Raises Strengthening;Both;2 sets;10 reps   Straight Leg Raise with External Rotation AAROM;Strengthening;Both;2 sets;10 reps     Cryotherapy   Number Minutes Cryotherapy 10 Minutes   Cryotherapy Location Knee   Type of Cryotherapy Ice pack     Manual Therapy   Manual Therapy Taping   McConnell I tape to correct lateral tracking                 PT Education - 12/04/15 1252    Education provided Yes   Education Details HEP reprint   Person(s) Educated Patient   Methods Explanation   Comprehension Verbalized understanding          PT Short Term Goals - 12/04/15 1254      PT SHORT TERM GOAL #1   Title Pt will be I with HEP for knee ROM and strength    Status On-going     PT SHORT TERM GOAL #2   Title Pt will be  able to walk with improved heel strike, less feeling of instability.    Status On-going           PT Long Term Goals - 12/04/15 1254      PT LONG TERM GOAL #1   Title Pt will score <55 % impaired on FOTO to demo functional improvement.    Status On-going     PT LONG TERM GOAL #2   Title Pt will understand RICE and self care for pain, swelling.    Status On-going     PT LONG TERM GOAL #3   Title Pt will be I with HEP at DC.    Status On-going     PT LONG TERM GOAL #4   Title Pt will be able to walk for up to 20 min comfortably without brace.     Status On-going               Plan - 12/04/15 1253    Clinical Impression Statement Patient did well with mat level exercises, and her pain did not increase. Trial of McConnell tape for patellar stab.     PT Next Visit Plan progress knee ROM, strength and tolerance of CKC Rt. LE    PT Home Exercise Plan hamstring stretch and quad (SLR, set)    Consulted and Agree with Plan of Care Patient      Patient will benefit from skilled therapeutic intervention in order to improve the following deficits and impairments:  Abnormal gait, Decreased range of motion, Difficulty walking, Increased fascial restricitons, Obesity, Decreased endurance, Decreased activity tolerance, Pain, Improper body mechanics, Impaired flexibility, Hypomobility, Decreased balance, Decreased strength, Decreased mobility, Postural dysfunction, Increased edema  Visit Diagnosis: Stiffness of right knee, not elsewhere classified  Pain in right knee  Muscle weakness (generalized)  Difficulty in walking     Problem List Patient Active Problem List   Diagnosis Date Noted  . 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 Chronic pain syndrome   . Esophageal reflux   . Depression   . Rash 11/14/2015  . Cellulitis of great toe of right foot 08/12/2015  . Toe ulcer, right ([place]) 04/11/2015  . Cellulitis of toe of right foot 04/11/2015  . Nonspecific chest pain   . Chest  pain 10/26/2014  . Morbid obesity due to excess calories ([place]) 10/26/2014  . Hypokalemia 10/26/2014  . Manic bipolar  I disorder ([place]) 10/26/2014  . Atypical angina ([place]) 10/26/2014  . Manic bipolar I disorder with rapid cycling ([place]) 05/18/2011    Class: Acute    PAA,JENNIFER 12/04/2015, 12:58 PM  [facility] [place] [address] [place], [place], [address] Phone: [phone]   Fax:  [phone]  Name: [name] [name] MRN: [id number] Date of Birth: 11/24/1965   Raeford Razor, PT 12/04/15 12:58 PM Phone: [phone] Fax: [phone]

## 2015-12-05 ENCOUNTER — Ambulatory Visit: Payer: Medicare Other

## 2015-12-05 ENCOUNTER — Ambulatory Visit (INDEPENDENT_AMBULATORY_CARE_PROVIDER_SITE_OTHER): Payer: Medicare Other | Admitting: Podiatry

## 2015-12-05 VITALS — BP 125/74 | HR 77 | Resp 14

## 2015-12-05 DIAGNOSIS — M79671 Pain in right foot: Secondary | ICD-10-CM

## 2015-12-05 DIAGNOSIS — M2041 Other hammer toe(s) (acquired), right foot: Secondary | ICD-10-CM

## 2015-12-05 DIAGNOSIS — M2031 Hallux varus (acquired), right foot: Secondary | ICD-10-CM

## 2015-12-05 NOTE — Patient Instructions (Signed)

## 2015-12-08 ENCOUNTER — Other Ambulatory Visit: Payer: Self-pay | Admitting: Podiatry

## 2015-12-09 ENCOUNTER — Telehealth: Payer: Self-pay | Admitting: *Deleted

## 2015-12-09 ENCOUNTER — Ambulatory Visit: Payer: Medicare Other | Admitting: Physical Therapy

## 2015-12-09 DIAGNOSIS — M25561 Pain in right knee: Secondary | ICD-10-CM

## 2015-12-09 DIAGNOSIS — R262 Difficulty in walking, not elsewhere classified: Secondary | ICD-10-CM

## 2015-12-09 DIAGNOSIS — M6281 Muscle weakness (generalized): Secondary | ICD-10-CM | POA: Diagnosis not present

## 2015-12-09 DIAGNOSIS — M25661 Stiffness of right knee, not elsewhere classified: Secondary | ICD-10-CM

## 2015-12-09 NOTE — Telephone Encounter (Signed)
"  I'm just wondering when my surgery is going to be scheduled."

## 2015-12-09 NOTE — Therapy (Signed)
[employer] [place], [place], [address] Phone: [phone]   Fax:  [phone]  Physical Therapy Treatment  Patient Details  Name: [name] [name] MRN: [id number] Date of Birth: October 22, 1965 Referring Provider: French Ana  Encounter Date: 12/09/2015      PT End of Session - 12/09/15 1138    Visit Number 3   Number of Visits 16   Date for PT Re-Evaluation 01/20/16   PT Start Time 1110   PT Stop Time 1140  pt had to leave early   PT Time Calculation (min) 30 min   Activity Tolerance Patient tolerated treatment well   Behavior During Therapy [facility] for tasks assessed/performed      Past Medical History:  Diagnosis Date  . Anxiety   . Asthma   . Bipolar 1 disorder ([place])    ect treatments last treatment Sep 02 1011  . Depression   . GERD (gastroesophageal reflux disease)   . Hypertension   . Seizures ([place])    last seizure was so long ago she can't remember.   . Sleep apnea     Past Surgical History:  Procedure Laterality Date  . ABDOMINAL HYSTERECTOMY    . BACK SURGERY    . Laproscopic knee surgery      There were no vitals filed for this visit.      Subjective Assessment - 12/09/15 1115    Subjective I feel like it is getting worse.  It pops out alot, even when I wear the brace.                          [place] Adult PT Treatment/Exercise - 12/09/15 1117      Knee/Hip Exercises: Supine   Quad Sets Strengthening;Right;1 set;15 reps   Short Arc Quad Sets Strengthening;Right;1 set;15 reps   Hip Adduction Isometric Strengthening;Both;1 set;10 reps   Bridges with [employer] Strengthening;Both;1 set   Straight Leg Raises Strengthening;Both;2 sets;10 reps   Other Supine Knee/Hip Exercises isometric hamstring      Cryotherapy   Number Minutes Cryotherapy 5 Minutes   Cryotherapy Location Knee   Type of Cryotherapy Ice pack     Manual Therapy   Manual Therapy Taping   Kinesiotex  Facilitate Muscle     Kinesiotix   Facilitate Muscle  2 Ys encircling patella with knee flexion                PT Education - 12/09/15 1137    Education provided Yes   Education Details tape, ligament vs tendon    Person(s) Educated Patient   Methods Explanation   Comprehension Verbalized understanding          PT Short Term Goals - 12/04/15 1254      PT SHORT TERM GOAL #1   Title Pt will be I with HEP for knee ROM and strength    Status On-going     PT SHORT TERM GOAL #2   Title Pt will be able to walk with improved heel strike, less feeling of instability.    Status On-going           PT Long Term Goals - 12/04/15 1254      PT LONG TERM GOAL #1   Title Pt will score <55 % impaired on FOTO to demo functional improvement.    Status On-going     PT LONG TERM GOAL #2   Title Pt will understand RICE and self  care for pain, swelling.    Status On-going     PT LONG TERM GOAL #3   Title Pt will be I with HEP at DC.    Status On-going     PT LONG TERM GOAL #4   Title Pt will be able to walk for up to 20 min comfortably without brace.     Status On-going               Plan - 12/09/15 1138    Clinical Impression Statement Tried a different tape to help stabilize patella.  Worked with low level mat exercises due to pain adn feelings of instabiity in knee.    PT Next Visit Plan progress knee ROM, strength and tolerance of CKC Rt. LE    PT Home Exercise Plan hamstring stretch and quad (SLR, set)    Consulted and Agree with Plan of Care Patient      Patient will benefit from skilled therapeutic intervention in order to improve the following deficits and impairments:  Abnormal gait, Decreased range of motion, Difficulty walking, Increased fascial restricitons, Obesity, Decreased endurance, Decreased activity tolerance, Pain, Improper body mechanics, Impaired flexibility, Hypomobility, Decreased balance, Decreased strength, Decreased mobility, Postural  dysfunction, Increased edema  Visit Diagnosis: Stiffness of right knee, not elsewhere classified  Pain in right knee  Muscle weakness (generalized)  Difficulty in walking     Problem List Patient Active Problem List   Diagnosis Date Noted  . 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 Chronic pain syndrome   . Esophageal reflux   . Depression   . Rash 11/14/2015  . Cellulitis of great toe of right foot 08/12/2015  . Toe ulcer, right ([place]) 04/11/2015  . Cellulitis of toe of right foot 04/11/2015  . Nonspecific chest pain   . Chest pain 10/26/2014  . Morbid obesity due to excess calories ([place]) 10/26/2014  . Hypokalemia 10/26/2014  . Manic bipolar I disorder ([place]) 10/26/2014  . Atypical angina ([place]) 10/26/2014  . Manic bipolar I disorder with rapid cycling ([place]) 05/18/2011    Class: Acute    Ogden Handlin 12/09/2015, 11:40 AM  [facility] [address] [place], [place], [address] Phone: [phone]   Fax:  [phone]  Name: [name] [name] MRN: [id number] Date of Birth: 31-Oct-1965   Raeford Razor, PT 12/09/15 11:40 AM Phone: [phone] Fax: [phone]

## 2015-12-10 NOTE — Telephone Encounter (Signed)
"  I haven't heard anything from anyone about my surgery."  When would you like to schedule?  "I'd like to do it as soon as possible."  He can do it 12/31/15.  "Put me down.  What time do I need to be there?"  I can't give you a time.  Someone from the surgical center will call you with a time 1-2 days before surgery date.  You can go ahead and register with the surgical center.  "I can do it now?"  Yes, you can do it now.

## 2015-12-11 ENCOUNTER — Ambulatory Visit: Payer: Medicare Other | Admitting: Physical Therapy

## 2015-12-11 DIAGNOSIS — M25661 Stiffness of right knee, not elsewhere classified: Secondary | ICD-10-CM

## 2015-12-11 DIAGNOSIS — M25561 Pain in right knee: Secondary | ICD-10-CM | POA: Diagnosis not present

## 2015-12-11 DIAGNOSIS — R262 Difficulty in walking, not elsewhere classified: Secondary | ICD-10-CM

## 2015-12-11 DIAGNOSIS — M6281 Muscle weakness (generalized): Secondary | ICD-10-CM

## 2015-12-11 NOTE — Therapy (Signed)
[place], [place], [address] Phone: [phone]   Fax:  [phone]  Physical Therapy Treatment  Patient Details  Name: [name] [name] MRN: [id number] Date of Birth: [birth year] Referring Provider: French Ana  Encounter Date: 12/11/2015      PT End of Session - 12/11/15 1148    Visit Number 4   Number of Visits 16   Date for PT Re-Evaluation 01/20/16   PT Start Time 1102   PT Stop Time 1155   PT Time Calculation (min) 53 min   Activity Tolerance Patient tolerated treatment well   Behavior During Therapy [facility] for tasks assessed/performed      Past Medical History:  Diagnosis Date  . Anxiety   . Asthma   . Bipolar 1 disorder ([place])    ect treatments last treatment Sep 02 1011  . Depression   . GERD (gastroesophageal reflux disease)   . Hypertension   . Seizures ([place])    last seizure was so long ago she can't remember.   . Sleep apnea     Past Surgical History:  Procedure Laterality Date  . ABDOMINAL HYSTERECTOMY    . BACK SURGERY    . Laproscopic knee surgery      There were no vitals filed for this visit.      Subjective Assessment - 12/11/15 1112    Subjective Pt a little better today.  Tape helped and is still on.  Having toe surgery in Sept Rt. GR Toe, may not be able to do PT after that.     Currently in Pain? Yes   Pain Score 5    Pain Location Knee   Pain Orientation Right   Pain Descriptors / Indicators Sore   Pain Type Chronic pain   Pain Onset More than a month ago   Pain Frequency Intermittent                [facility] Adult PT Treatment/Exercise - 12/11/15 1118      Knee/Hip Exercises: Stretches   Active Hamstring Stretch 2 reps;30 seconds     Knee/Hip Exercises: Aerobic   Nustep LE only L 3, 5 min      Knee/Hip Exercises: Standing   Hip Abduction Stengthening;Both;2 sets;10 reps   SLS each LE on foam      Knee/Hip Exercises: Seated   Long Arc Quad  Strengthening;Right;2 sets;20 reps;Weights   Long Arc Quad Weight 3 lbs.   Long Arc [employer] Limitations used ball between [employer] x 10   Clamshell with [employer]   Sit to [employer] 1 set;10 reps;without UE support     Knee/Hip Exercises: Supine   Bridges with [employer] Strengthening;Both;1 set;20 reps     Knee/Hip Exercises: Sidelying   Hip ABduction --   Clams --     Cryotherapy   Number Minutes Cryotherapy 15 Minutes   Cryotherapy Location Knee   Type of Cryotherapy Ice pack     Electrical Stimulation   Electrical Stimulation Location Rt. knee    Electrical Stimulation Action IFC   Electrical Stimulation Parameters 13   Electrical Stimulation Goals Pain     Manual Therapy   Manual Therapy Taping   Kinesiotex Facilitate Muscle     Kinesiotix   Facilitate Muscle  2 Ys encircling patella with knee flexion                PT Education - 12/11/15 1147    Education provided  Yes   Education Details hip strength    Person(s) Educated Patient   Methods Explanation   Comprehension Verbalized understanding          PT Short Term Goals - 12/11/15 1315      PT SHORT TERM GOAL #1   Title Pt will be I with HEP for knee ROM and strength    Status Partially Met     PT SHORT TERM GOAL #2   Title Pt will be able to walk with improved heel strike, less feeling of instability.    Status On-going           PT Long Term Goals - 12/11/15 1316      PT LONG TERM GOAL #1   Title Pt will score <55 % impaired on FOTO to demo functional improvement.    Status On-going     PT LONG TERM GOAL #2   Title Pt will understand RICE and self care for pain, swelling.    Status Achieved     PT LONG TERM GOAL #3   Title Pt will be I with HEP at DC.    Status On-going     PT LONG TERM GOAL #4   Title Pt will be able to walk for up to 20 min comfortably without brace.     Status On-going               Plan - 12/11/15 1311    Clinical Impression  Statement Patient had increase in knee pain after standing exercises Rt. lateral hip very weak observed in walking and SLS.    PT Next Visit Plan progress knee ROM and give for HEP (bridge, standing hip), strength and tolerance of CKC Rt. LE , repeat tape and IFC if she liked it.    PT Home Exercise Plan hamstring stretch and quad (SLR, set)    Consulted and Agree with Plan of Care Patient      Patient will benefit from skilled therapeutic intervention in order to improve the following deficits and impairments:  Abnormal gait, Decreased range of motion, Difficulty walking, Increased fascial restricitons, Obesity, Decreased endurance, Decreased activity tolerance, Pain, Improper body mechanics, Impaired flexibility, Hypomobility, Decreased balance, Decreased strength, Decreased mobility, Postural dysfunction, Increased edema  Visit Diagnosis: Stiffness of right knee, not elsewhere classified  Pain in right knee  Muscle weakness (generalized)  Difficulty in walking     Problem List Patient Active Problem List   Diagnosis Date Noted  . 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 Chronic pain syndrome   . Esophageal reflux   . Depression   . Rash 11/14/2015  . Cellulitis of great toe of right foot 08/12/2015  . Toe ulcer, right ([place]) 04/11/2015  . Cellulitis of toe of right foot 04/11/2015  . Nonspecific chest pain   . Chest pain 10/26/2014  . Morbid obesity due to excess calories ([place]) 10/26/2014  . Hypokalemia 10/26/2014  . Manic bipolar I disorder ([place]) 10/26/2014  . Atypical angina ([place]) 10/26/2014  . Manic bipolar I disorder with rapid cycling ([place]) 05/18/2011    Class: Acute    [facility] 12/11/2015, 1:23 PM  [facility] [address] [place], [place], [address] Phone: [phone]   Fax:  [phone]  Name: [name] [name] MRN: [id number] Date of Birth: [birth year]   Raeford Razor, PT 12/11/15 1:23 PM Phone:  [phone] Fax: [phone]

## 2015-12-15 ENCOUNTER — Other Ambulatory Visit: Payer: Self-pay | Admitting: *Deleted

## 2015-12-15 MED ORDER — POTASSIUM CHLORIDE CRYS ER 20 MEQ PO TBCR: 40.0000 meq | EXTENDED_RELEASE_TABLET | Freq: Every day | ORAL | 0 refills | Status: DC

## 2015-12-15 NOTE — Telephone Encounter (Signed)
Received call from patient.   Requested refill on K+.  Prescription sent to pharmacy.  

## 2015-12-15 NOTE — Progress Notes (Signed)
Patient ID: [name] [name], female   DOB: Sep 09, 1965, 50 y.o.   MRN: [id number]  Subjective: 50 year old female presents the office today for follow-up evaluation of reoccurring infections of the right big toe. She states his last upon the she's had no redness or S1 to the toe and she is doing well. This time should discuss surgical invention to help straighten her toe to help prevent any further ulceration or recurrence of infection. She currently denies any drainage or pus. Denies any systemic complaints such as fevers, chills, nausea, vomiting. No acute changes since last appointment, and no other complaints at this time.   Objective: AAO x3, NAD DP/PT pulses palpable bilaterally, CRT less than 3 seconds Hallux malleus is present of right hallux and there is a prominence the dorsal medial aspect of the IPJ. There is no edema, erythema, increase in warmth to the toe today. Very small superficial wound is present on the medial aspect of the IPJ however this appears MRN bracing type lesion. There is no severe hyperkeratotic tissue buildup today. No other open lesions or pre-ulcer lesions identified today. No open lesions or pre-ulcerative lesions.  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  -X-rays were obtained and reviewed with the patient. No definitive evidence of acute osteomyelitis. -At this time discussed both conservative and surgical treatment options. At this time should proceed with surgical intervention to help prevent any further ulcerations and infections. I discussed with her hallux IPJ arthrodesis with K wire fixation. I discussed with her that she is a high risk of infection which could result in amputation of the toe and she understands this. She also states that if we did not do  surgery she would still likely have a amputation of the toe in the future due to recurrent infections. Discussed that this is not a guarantee resolution of symptoms. -The incision placement as well as the postoperative course was discussed with the patient. I discussed risks of the surgery which include, but not limited to, infection, bleeding, pain, swelling, need for further surgery, delayed or nonhealing, painful or ugly scar, numbness or sensation changes, over/under correction, recurrence, transfer lesions, further deformity, hardware failure, DVT/PE, transfer lesions, loss of toe/foot. Patient understands these risks and wishes to proceed with surgery. The surgical consent was reviewed with the patient all 3 pages were signed. No promises or guarantees were given to the outcome of the procedure. All questions were answered to the best of my ability. Before the surgery the patient was encouraged to call the office if there is any further questions. The surgery will be performed at the [facility] on an outpatient basis.  Celesta Gentile, DPM

## 2015-12-16 ENCOUNTER — Ambulatory Visit: Payer: Medicare Other | Admitting: Physical Therapy

## 2015-12-18 ENCOUNTER — Ambulatory Visit: Payer: Medicare Other | Admitting: Physical Therapy

## 2015-12-18 DIAGNOSIS — M6281 Muscle weakness (generalized): Secondary | ICD-10-CM

## 2015-12-18 DIAGNOSIS — M25561 Pain in right knee: Secondary | ICD-10-CM

## 2015-12-18 DIAGNOSIS — R262 Difficulty in walking, not elsewhere classified: Secondary | ICD-10-CM | POA: Diagnosis not present

## 2015-12-18 DIAGNOSIS — M25661 Stiffness of right knee, not elsewhere classified: Secondary | ICD-10-CM

## 2015-12-18 NOTE — Therapy (Signed)
[facility] [address] [place], [place], [address] Phone: [phone]   Fax:  [phone]  Physical Therapy Treatment and Discharge  Patient Details  Name: [name] [name] MRN: [id number] Date of Birth: 01-Dec-1965 Referring Provider: Madelon Lips  Encounter Date: 12/18/2015      PT End of Session - 12/18/15 1128    Visit Number 5   Number of Visits 16   PT Start Time 1105   PT Stop Time 1148   PT Time Calculation (min) 43 min   Activity Tolerance Patient tolerated treatment well   Behavior During Therapy [facility] for tasks assessed/performed      Past Medical History:  Diagnosis Date  . Anxiety   . Asthma   . Bipolar 1 disorder ([name])    ect treatments last treatment Sep 02 1011  . Depression   . GERD (gastroesophageal reflux disease)   . Hypertension   . Seizures ([name])    last seizure was so long ago she can't remember.   . Sleep apnea     Past Surgical History:  Procedure Laterality Date  . ABDOMINAL HYSTERECTOMY    . BACK SURGERY    . Laproscopic knee surgery      There were no vitals filed for this visit.      Subjective Assessment - 12/18/15 1106    Subjective I'm not having a good week, my back pain has flared. Pt has been very busy with appts and family. She feels she may be getting worse. Would like to be DC today.    Pain Score 5    Pain Location Knee   Pain Orientation Right   Pain Descriptors / Indicators Sore   Pain Type Chronic pain   Multiple Pain Sites Yes   Pain Score 7   Pain Location Back   Pain Orientation Left;Lower   Pain Descriptors / Indicators Aching   Pain Type Chronic pain   Pain Onset More than a month ago   Pain Frequency Intermittent   Aggravating Factors  when she walks too much   Pain Relieving Factors resting, heat             [facility] PT Assessment - 12/18/15 1114      Observation/Other Assessments   Focus on Therapeutic Outcomes (FOTO)  62%     Circumferential Edema    Circumferential - Right 16 inch    Circumferential - Left  15.75 inch                     [facility] Adult PT Treatment/Exercise - 12/18/15 1116      Knee/Hip Exercises: Stretches   Active Hamstring Stretch 2 reps;30 seconds   Other Knee/Hip Stretches ITB 30 sec x 2 each LE      Knee/Hip Exercises: Aerobic   Nustep L4 LE only for 6 min      Knee/Hip Exercises: Standing   Hip Abduction Stengthening;Both;2 sets;10 reps     Knee/Hip Exercises: Supine   Quad Sets Strengthening;Both;1 set;20 reps     Manual Therapy   Manual Therapy Soft tissue mobilization;Myofascial release;Taping   Soft tissue mobilization distal quads, peripatella    Myofascial Release lateral thigh, ITB, mod pressure    Kinesiotex Facilitate Muscle     Kinesiotix   Facilitate Muscle  2 Ys encircling patella with knee flexion                PT Education - 12/18/15 1128    Education provided  Yes   Education Details DC, HEP    Person(s) Educated Patient   Methods Explanation;Demonstration;Handout   Comprehension Verbalized understanding;Returned demonstration          PT Short Term Goals - December 21, 2015 1128      PT SHORT TERM GOAL #1   Title Pt will be I with HEP for knee ROM and strength    Status Achieved     PT SHORT TERM GOAL #2   Title Pt will be able to walk with improved heel strike, less feeling of instability.    Baseline feels like it may bend backwards    Status Not Met           PT Long Term Goals - 21-Dec-2015 1128      PT LONG TERM GOAL #1   Title Pt will score <55 % impaired on FOTO to demo functional improvement.      PT LONG TERM GOAL #2   Title Pt will understand RICE and self care for pain, swelling.    Status Achieved     PT LONG TERM GOAL #3   Title Pt will be I with HEP at DC.    Status Achieved     PT LONG TERM GOAL #4   Title Pt will be able to walk for up to 20 min comfortably without brace.     Baseline sometimes can walk 20 min WITH brace     Status Partially Met               Plan - 12/21/2015 1318    Clinical Impression Statement Pt requested DC today to her lack of progress (and pain increase).  She has HEP established.  Encouraged her to try aquatics and she was not interested.     PT Next Visit Plan NA   PT Home Exercise Plan hamstring stretch and quad (SLR, set) , ITB   Consulted and Agree with Plan of Care Patient      Patient will benefit from skilled therapeutic intervention in order to improve the following deficits and impairments:  Abnormal gait, Decreased range of motion, Difficulty walking, Increased fascial restricitons, Obesity, Decreased endurance, Decreased activity tolerance, Pain, Improper body mechanics, Impaired flexibility, Hypomobility, Decreased balance, Decreased strength, Decreased mobility, Postural dysfunction, Increased edema  Visit Diagnosis: Stiffness of right knee, not elsewhere classified  Muscle weakness (generalized)  Difficulty in walking  Pain in right knee       G-Codes - December 21, 2015 1321    Functional Assessment Tool Used FOTO   Functional Limitation Mobility: Walking and moving around   Mobility: Walking and Moving Around Current Status [phone]) At least 60 percent but less than 80 percent impaired, limited or restricted   Mobility: Walking and Moving Around Goal Status [phone]) At least 40 percent but less than 60 percent impaired, limited or restricted   Mobility: Walking and Moving Around Discharge Status [phone]) At least 60 percent but less than 80 percent impaired, limited or restricted      Problem List Patient Active Problem List   Diagnosis Date Noted  . 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 Chronic pain syndrome   . Esophageal reflux   . Depression   . Rash 11/14/2015  . Cellulitis of great toe of right foot 08/12/2015  . Toe ulcer, right ([place]) 04/11/2015  . Cellulitis of toe of right foot 04/11/2015  . Nonspecific chest pain   . Chest pain 10/26/2014  . Morbid  obesity due to excess calories ([place]) 10/26/2014  .  Hypokalemia 10/26/2014  . Manic bipolar I disorder ([place]) 10/26/2014  . Atypical angina ([place]) 10/26/2014  . Manic bipolar I disorder with rapid cycling ([place]) 05/18/2011    Class: Acute    PAA,JENNIFER 12/18/2015, 1:21 PM  [facility] [address] [place], [place], [address] Phone: [phone]   Fax:  [phone]  Name: [name] [name] MRN: [id number] Date of Birth: 12/30/1965   PHYSICAL THERAPY DISCHARGE SUMMARY  Visits from Start of Care: 5   Current functional level related to goals / functional outcomes: See goals    Remaining deficits: Pain, feeling of instability, ROM, weakness, gait   Education / Equipment: Back and core relation to abnormal gait, RICE, HEP   Plan: Patient agrees to discharge.  Patient goals were partially met. Patient is being discharged due to the patient's request.  ?????    Raeford Razor, PT 12/18/15 1:22 PM Phone: [phone] Fax: [phone]

## 2015-12-23 ENCOUNTER — Ambulatory Visit: Payer: Medicare Other | Admitting: Physical Therapy

## 2015-12-24 ENCOUNTER — Other Ambulatory Visit: Payer: Self-pay | Admitting: Family Medicine

## 2015-12-24 DIAGNOSIS — G4710 Hypersomnia, unspecified: Secondary | ICD-10-CM

## 2015-12-24 DIAGNOSIS — G4730 Sleep apnea, unspecified: Principal | ICD-10-CM

## 2015-12-25 ENCOUNTER — Ambulatory Visit: Payer: Medicare Other | Admitting: Physical Therapy

## 2015-12-25 ENCOUNTER — Other Ambulatory Visit: Payer: Self-pay | Admitting: Podiatry

## 2015-12-25 ENCOUNTER — Other Ambulatory Visit: Payer: Self-pay | Admitting: Family Medicine

## 2015-12-25 DIAGNOSIS — M4325 Fusion of spine, thoracolumbar region: Secondary | ICD-10-CM | POA: Diagnosis not present

## 2015-12-25 DIAGNOSIS — G894 Chronic pain syndrome: Secondary | ICD-10-CM | POA: Diagnosis not present

## 2015-12-25 MED ORDER — CLOTRIMAZOLE-BETAMETHASONE 1-0.05 % EX CREA: 1.0000 "application " | TOPICAL_CREAM | Freq: Two times a day (BID) | CUTANEOUS | 1 refills | Status: DC

## 2015-12-29 DIAGNOSIS — M25561 Pain in right knee: Secondary | ICD-10-CM | POA: Diagnosis not present

## 2015-12-31 ENCOUNTER — Encounter: Payer: Self-pay | Admitting: Podiatry

## 2015-12-31 DIAGNOSIS — I10 Essential (primary) hypertension: Secondary | ICD-10-CM | POA: Diagnosis not present

## 2015-12-31 DIAGNOSIS — M2031 Hallux varus (acquired), right foot: Secondary | ICD-10-CM | POA: Diagnosis not present

## 2015-12-31 DIAGNOSIS — M205X1 Other deformities of toe(s) (acquired), right foot: Secondary | ICD-10-CM | POA: Diagnosis not present

## 2015-12-31 DIAGNOSIS — M2041 Other hammer toe(s) (acquired), right foot: Secondary | ICD-10-CM | POA: Diagnosis not present

## 2016-01-02 IMAGING — CR DG FOOT COMPLETE 3+V*R*
3 series · 3 of 3 positions shown · non-contrast
Comparison: Right foot MRI 10/09/2010

CLINICAL DATA: Pressure wound. Right toe pain. Concern for
osteomyelitis

EXAM:
RIGHT FOOT COMPLETE - 3+ VIEW

[x foot ap right]
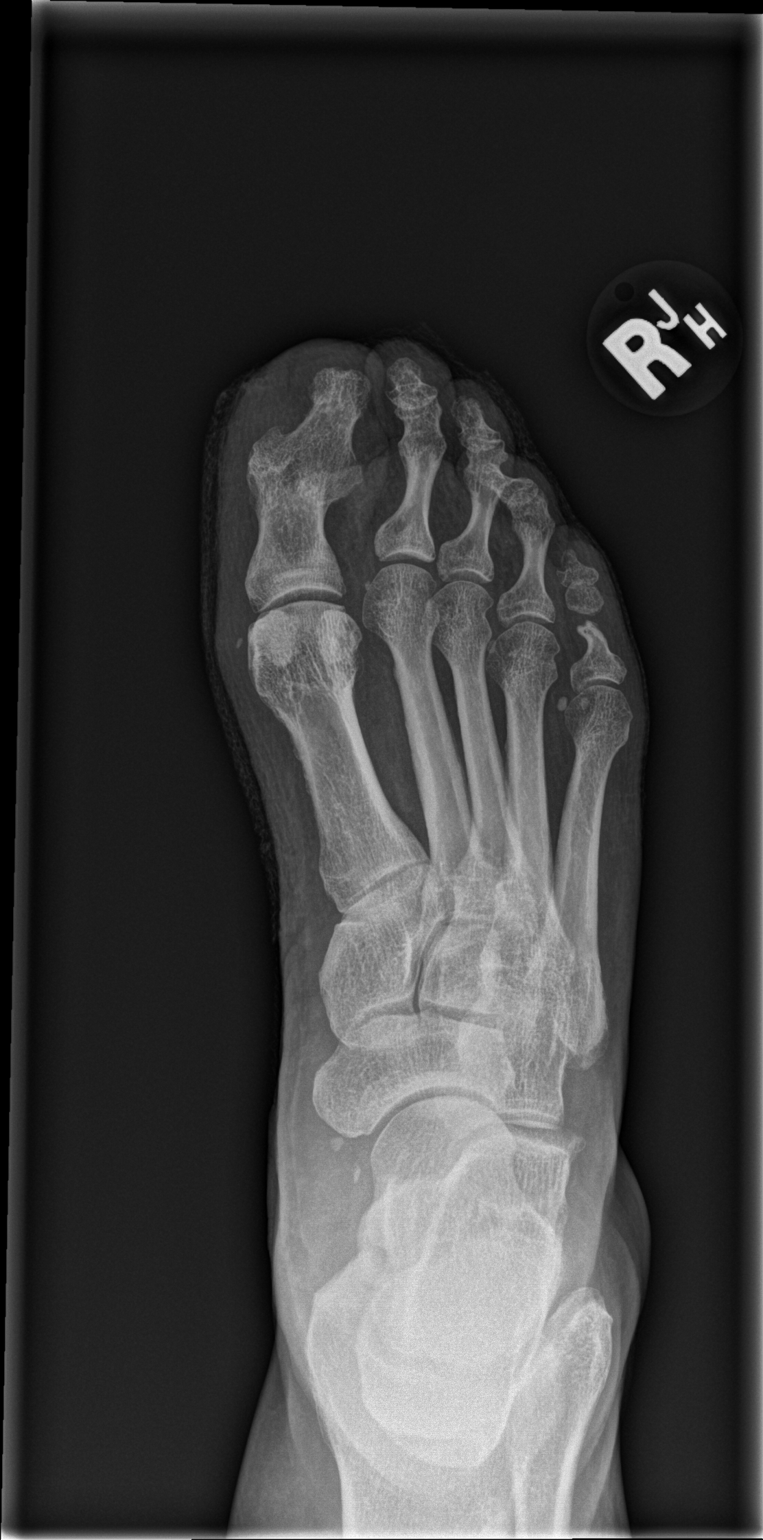

[x foot obl right]
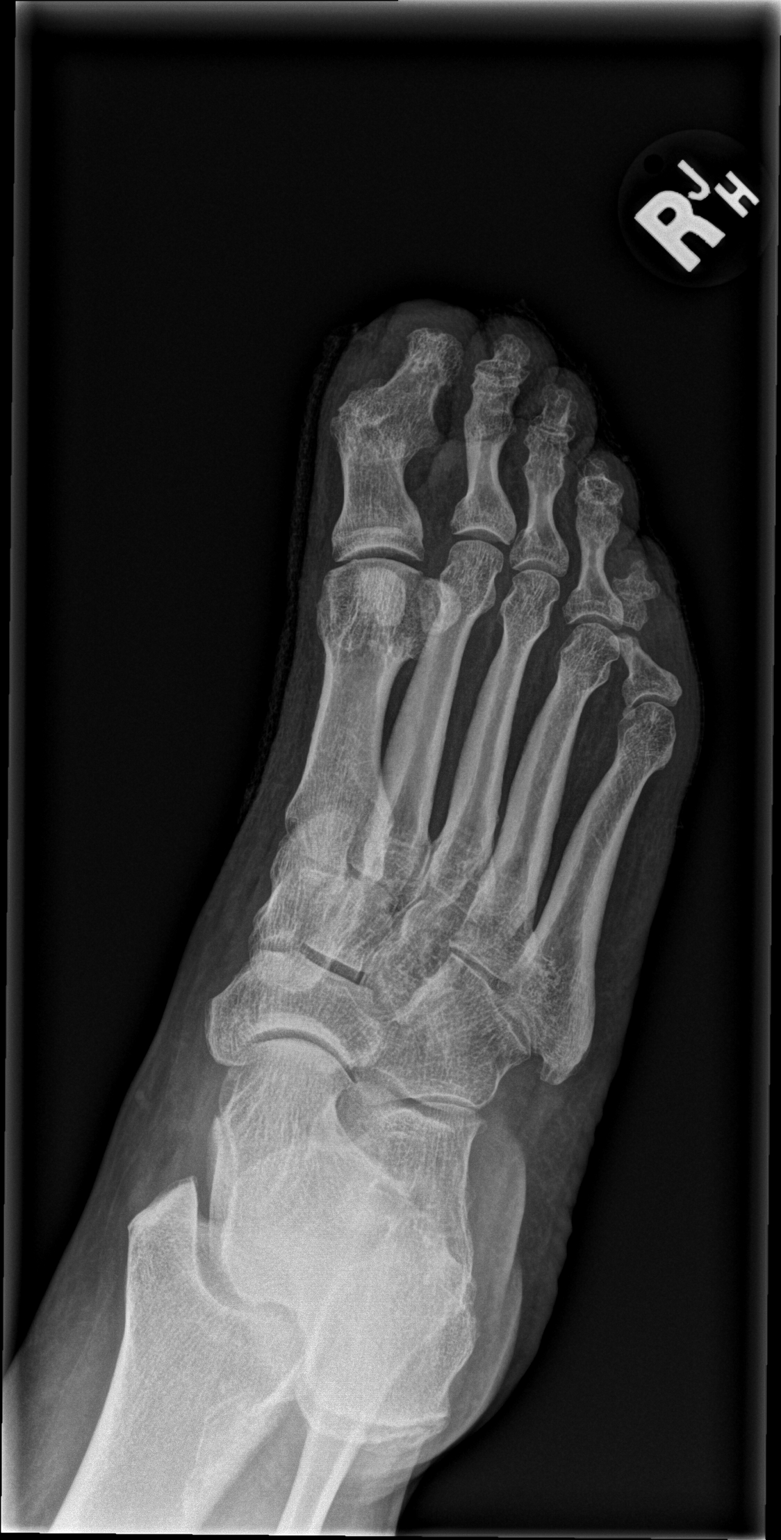

[x foot lat right]
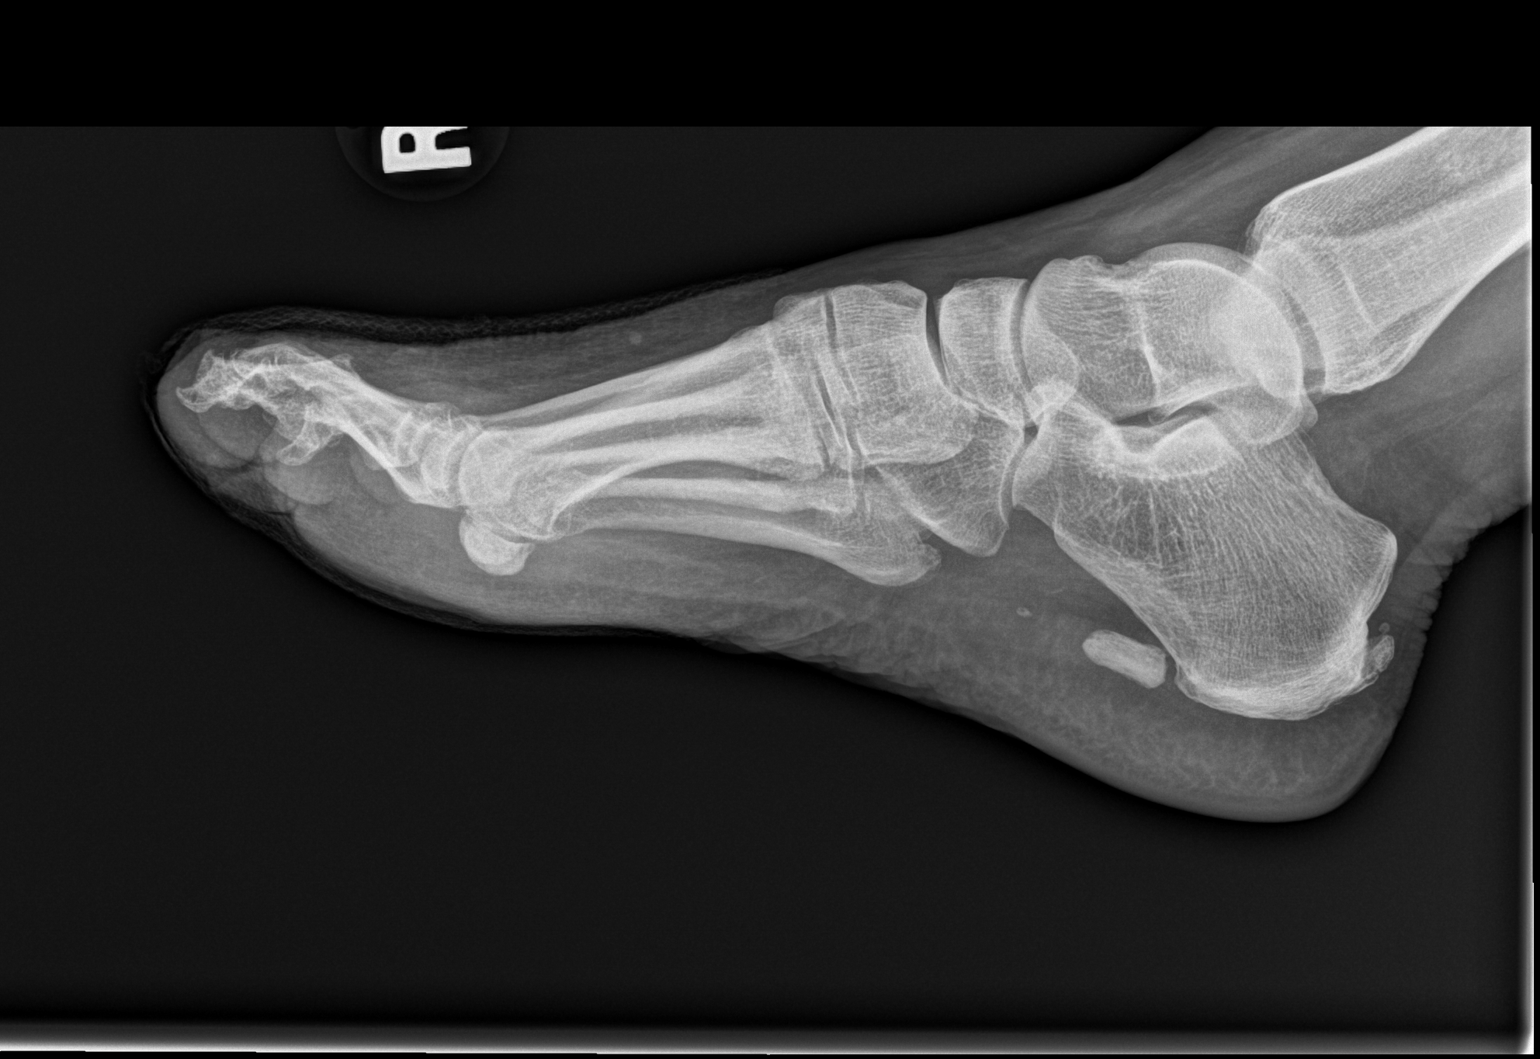

[3 of 3 positions shown; findings below may reference images not displayed]

FINDINGS: There is bandage material surrounding the forefoot. There is severe
loss of joint space at the distal interphalangeal joint of the first
digit. No clear the cortical erosion of the first digit. There is
chronic erosion of the metaphysis of the proximal phalanx of the
fifth digit. No subcutaneous gas evident. No foreign body.
IMPRESSION: 1. No clear evidence of osteomyelitis of first digit.
2. Severe arthropathy of the distal interphalangeal joint.

## 2016-01-05 NOTE — Progress Notes (Signed)
DOS 09.13.2017 Right foot big toe fusion with wire fixation

## 2016-01-09 ENCOUNTER — Ambulatory Visit: Payer: Medicare Other

## 2016-01-09 ENCOUNTER — Ambulatory Visit (INDEPENDENT_AMBULATORY_CARE_PROVIDER_SITE_OTHER): Payer: Medicare Other | Admitting: Podiatry

## 2016-01-09 ENCOUNTER — Encounter: Payer: Self-pay | Admitting: Podiatry

## 2016-01-09 DIAGNOSIS — Z09 Encounter for follow-up examination after completed treatment for conditions other than malignant neoplasm: Secondary | ICD-10-CM

## 2016-01-09 DIAGNOSIS — L03031 Cellulitis of right toe: Secondary | ICD-10-CM | POA: Diagnosis not present

## 2016-01-09 DIAGNOSIS — M2041 Other hammer toe(s) (acquired), right foot: Secondary | ICD-10-CM

## 2016-01-09 DIAGNOSIS — M2031 Hallux varus (acquired), right foot: Secondary | ICD-10-CM

## 2016-01-09 LAB — CBC WITH DIFFERENTIAL/PLATELET
Basophils Absolute: 0 cells/uL (ref 0–200)
Basophils Relative: 0 %
Eosinophils Absolute: 57 cells/uL (ref 15–500)
Eosinophils Relative: 1 %
HCT: 38 % (ref 35.0–45.0)
Hemoglobin: 12.8 g/dL (ref 11.7–15.5)
Lymphocytes Relative: 39 %
Lymphs Abs: 2223 cells/uL (ref 850–3900)
MCH: 31.5 pg (ref 27.0–33.0)
MCHC: 33.7 g/dL (ref 32.0–36.0)
MCV: 93.6 fL (ref 80.0–100.0)
MPV: 8.4 fL (ref 7.5–12.5)
Monocytes Absolute: 342 cells/uL (ref 200–950)
Monocytes Relative: 6 %
Neutro Abs: 3078 cells/uL (ref 1500–7800)
Neutrophils Relative %: 54 %
Platelets: 297 10*3/uL (ref 140–400)
RBC: 4.06 MIL/uL (ref 3.80–5.10)
RDW: 13.4 % (ref 11.0–15.0)
WBC: 5.7 10*3/uL (ref 3.8–10.8)

## 2016-01-09 LAB — BASIC METABOLIC PANEL
BUN: 11 mg/dL (ref 7–25)
CO2: 24 mmol/L (ref 20–31)
Calcium: 9.2 mg/dL (ref 8.6–10.4)
Chloride: 102 mmol/L (ref 98–110)
Creat: 0.76 mg/dL (ref 0.50–1.05)
Glucose, Bld: 85 mg/dL (ref 65–99)
Potassium: 3.9 mmol/L (ref 3.5–5.3)
Sodium: 137 mmol/L (ref 135–146)

## 2016-01-09 MED ORDER — CLINDAMYCIN HCL 300 MG PO CAPS: 300.0000 mg | ORAL_CAPSULE | Freq: Three times a day (TID) | ORAL | 2 refills | Status: DC

## 2016-01-11 ENCOUNTER — Other Ambulatory Visit: Payer: Self-pay | Admitting: Family Medicine

## 2016-01-11 NOTE — Progress Notes (Signed)
Subjective: [name] [name] is a 50 y.o. is seen today in office s/p right hallux IPJ fusion preformed on 12/31/15. They state their pain is "tolderable". Continue offloading shoe as well as antibiotics. Denies any systemic complaints such as fevers, chills, nausea, vomiting. No calf pain, chest pain, shortness of breath.   Objective: General: No acute distress, AAOx3  DP/PT pulses palpable 2/4, CRT < 3 sec to all digits.  Motor function intact.  Right foot: Incision is well coapted without any evidence of dehiscence and sutures are intact. K wires are intact the distal aspect of the toe any redness or drainage. There is no surrounding erythema, ascending cellulitis, fluctuance, crepitus, malodor, drainage/purulence. There ismild edema around the surgical site. There is minimal pain along the surgical site.  No other areas of tenderness to bilateral lower extremities.  No other open lesions or pre-ulcerative lesions.  No pain with calf compression, swelling, warmth, erythema.   Assessment and Plan:  Status post right foot IPJ fusion, doing well with no complications   -Treatment options discussed including all alternatives, risks, and complications -X-rays were obtained and reviewed with the patient. Hardware intact. Evidence of acute fracture -Antibiotic ointment was applied followed by a bandage. Continue the bandages clean, dry, intact. Continue antibiotics. This is refilled today. Continue offloading shoe. Ice and elevation. -Ice/elevation -Pain medication as needed. -Monitor for any clinical signs or symptoms of infection and DVT/PE and directed to call the office immediately should any occur or go to the ER. -Follow-up as scheduled or sooner if any problems arise. In the meantime, encouraged to call the office with any questions, concerns, change in symptoms.  -Given that she's been on antibiotics for quite some time we'll check basic metabolic panel to evaluate renal function.  Celesta Gentile, DPM

## 2016-01-12 ENCOUNTER — Institutional Professional Consult (permissible substitution): Payer: Medicare Other | Admitting: Neurology

## 2016-01-12 ENCOUNTER — Ambulatory Visit (INDEPENDENT_AMBULATORY_CARE_PROVIDER_SITE_OTHER): Payer: Medicare Other | Admitting: Neurology

## 2016-01-12 ENCOUNTER — Encounter: Payer: Self-pay | Admitting: Neurology

## 2016-01-12 ENCOUNTER — Encounter: Payer: Self-pay | Admitting: Physician Assistant

## 2016-01-12 ENCOUNTER — Ambulatory Visit (INDEPENDENT_AMBULATORY_CARE_PROVIDER_SITE_OTHER): Payer: Medicare Other | Admitting: Physician Assistant

## 2016-01-12 VITALS — BP 126/88 | HR 87 | Temp 97.7°F | Resp 16 | Wt 230.0 lb

## 2016-01-12 VITALS — BP 112/70 | HR 90 | Resp 20 | Ht 63.5 in | Wt 227.0 lb

## 2016-01-12 DIAGNOSIS — G4710 Hypersomnia, unspecified: Secondary | ICD-10-CM

## 2016-01-12 DIAGNOSIS — E669 Obesity, unspecified: Secondary | ICD-10-CM | POA: Diagnosis not present

## 2016-01-12 DIAGNOSIS — G2581 Restless legs syndrome: Secondary | ICD-10-CM | POA: Diagnosis not present

## 2016-01-12 DIAGNOSIS — R300 Dysuria: Secondary | ICD-10-CM

## 2016-01-12 DIAGNOSIS — G4761 Periodic limb movement disorder: Secondary | ICD-10-CM | POA: Diagnosis not present

## 2016-01-12 DIAGNOSIS — R51 Headache: Secondary | ICD-10-CM

## 2016-01-12 DIAGNOSIS — R519 Headache, unspecified: Secondary | ICD-10-CM

## 2016-01-12 DIAGNOSIS — M79671 Pain in right foot: Secondary | ICD-10-CM

## 2016-01-12 DIAGNOSIS — G4733 Obstructive sleep apnea (adult) (pediatric): Secondary | ICD-10-CM

## 2016-01-12 DIAGNOSIS — F1190 Opioid use, unspecified, uncomplicated: Secondary | ICD-10-CM

## 2016-01-12 DIAGNOSIS — N390 Urinary tract infection, site not specified: Secondary | ICD-10-CM | POA: Diagnosis not present

## 2016-01-12 LAB — URINALYSIS, ROUTINE W REFLEX MICROSCOPIC
Bilirubin Urine: NEGATIVE
Glucose, UA: NEGATIVE
Ketones, ur: NEGATIVE
Nitrite: NEGATIVE
Protein, ur: NEGATIVE
Specific Gravity, Urine: 1.01 (ref 1.001–1.035)
pH: 6 (ref 5.0–8.0)

## 2016-01-12 LAB — URINALYSIS, MICROSCOPIC ONLY
Casts: NONE SEEN [LPF]
Crystals: NONE SEEN [HPF]
Yeast: NONE SEEN [HPF]

## 2016-01-12 MED ORDER — SULFAMETHOXAZOLE-TRIMETHOPRIM 800-160 MG PO TABS: 1.0000 | ORAL_TABLET | Freq: Two times a day (BID) | ORAL | 0 refills | Status: DC

## 2016-01-12 NOTE — Progress Notes (Signed)
Patient ID: [name] [name] MRN: [id number], DOB: 09/28/1965, 50 y.o. Date of Encounter: 01/12/2016, 4:56 PM    Chief Complaint:  Chief Complaint  Patient presents with  . Urinary Tract Infection    burning, cloudy started 3-4 days ago     HPI: 50 y.o. year old white female presents with above.   States that she noticed her urine looking cloudy for several days. Then also developed dysuria.  States that she is having no itching or burning when just sitting there. Only feels discomfort when she is urinating.  Says she "has had yeast infections and this is not a yeast infection"  Has had no fevers or chills. No back pain.     Home Meds:   Outpatient Medications Prior to Visit  Medication Sig Dispense Refill  . AMITIZA 24 MCG capsule TAKE 1 CAPSULE TWICE A DAY WITH A MEAL 60 capsule 5  . benztropine (COGENTIN) 1 MG tablet Take 1 mg by mouth 2 (two) times daily.     . Calcium Carbonate-Vit D-Min (CALCIUM 1200 PO) Take 1 tablet by mouth daily.    . citalopram (CELEXA) 40 MG tablet Take 40 mg by mouth at bedtime.     . clindamycin (CLEOCIN) 300 MG capsule Take 1 capsule (300 mg total) by mouth 3 (three) times daily. 30 capsule 2  . clotrimazole-betamethasone (LOTRISONE) cream Apply 1 application topically 2 (two) times daily. X 2 weeks 45 g 1  . cyclobenzaprine (FLEXERIL) 10 MG tablet Take 10 mg by mouth 3 (three) times daily as needed for muscle spasms.    . fluticasone (FLONASE) 50 MCG/ACT nasal spray PLACE 2 SPRAYS INTO BOTH NOSTRILS DAILY. 16 g 2  . gabapentin (NEURONTIN) 600 MG tablet Take 1 tablet (600 mg total) by mouth 3 (three) times daily. For anxiety and pain management. May have to stop to take ECT. 90 tablet 0  . haloperidol (HALDOL) 5 MG tablet Take 1 tablet (5 mg total) by mouth at bedtime. For psychosis    . hydrochlorothiazide (HYDRODIURIL) 25 MG tablet TAKE 1 TABLET EVERY DAY 90 tablet 2  . HYDROcodone-acetaminophen (NORCO) 10-325 MG per tablet Take 1 tablet by  mouth every 8 (eight) hours.     . hydrOXYzine (ATARAX/VISTARIL) 10 MG tablet Take 10 mg by mouth 3 (three) times daily.     Marland Kitchen KLOR-CON M20 20 MEQ tablet TAKE 2 TABLETS BY MOUTH EVERY DAY 60 tablet 5  . morphine (MS CONTIN) 30 MG 12 hr tablet     . Multiple Vitamins-Minerals (MULTIVITAMIN WITH MINERALS) tablet Take 1 tablet by mouth daily.    Marland Kitchen oxyCODONE-acetaminophen (PERCOCET/ROXICET) 5-325 MG tablet Take 1 tablet by mouth every 4 (four) hours as needed for severe pain.    . pantoprazole (PROTONIX) 40 MG tablet TAKE 1 TABLET BY MOUTH TWICE A DAY 180 tablet 2  . prazosin (MINIPRESS) 5 MG capsule Take 5 mg by mouth at bedtime.    Marland Kitchen PROAIR HFA 108 (90 BASE) MCG/ACT inhaler INHALE 2 PUFFS INTO THE LUNGS EVERY 4 HOURS AS NEEDED FOR WHEEZING 8.5 Inhaler 3  . promethazine (PHENERGAN) 12.5 MG tablet Take 1 tablet (12.5 mg total) by mouth every 8 (eight) hours as needed for nausea or vomiting. 20 tablet 0  . SUMAtriptan (IMITREX) 100 MG tablet TAKE 1 TABLET (100 MG TOTAL) BY MOUTH EVERY 2 (TWO) HOURS AS NEEDED. MIGRANES 10 tablet 2  . traZODone (DESYREL) 100 MG tablet Take 100 mg by mouth at bedtime.  0  .  VOLTAREN 1 % GEL Apply 2 g topically daily as needed (for pain).     . nitroGLYCERIN (NITROSTAT) 0.4 MG SL tablet Place 1 tablet (0.4 mg total) under the tongue every 5 (five) minutes as needed for chest pain. (Patient not taking: Reported on 01/12/2016) 30 tablet 0  . promethazine (PHENERGAN) 12.5 MG tablet Take 12.5 mg by mouth every 8 (eight) hours as needed for nausea or vomiting.     No facility-administered medications prior to visit.     Allergies:  Allergies  Allergen Reactions  . Tetracyclines & Related Other (See Comments)    syncope  . Tramadol Other (See Comments)    seizures  . Ciprofloxacin Rash  . Codeine Itching  . Penicillins Hives and Rash      Review of Systems: See HPI for pertinent ROS. All other ROS negative.    Physical Exam: Blood pressure 126/88, pulse 87,  temperature 97.7 F (36.5 C), temperature source Oral, resp. rate 16, weight 230 lb (104.3 kg)., Body mass index is 40.1 kg/m. General:  WFGwyndolyn Kaufman on right foot/lower leg. Using crutches. Appears in no acute distress. Neck: Supple. No thyromegaly. No lymphadenopathy. Lungs: Clear bilaterally to auscultation without wheezes, rales, or rhonchi. Breathing is unlabored. Heart: Regular rhythm. No murmurs, rubs, or gallops. Msk:  Strength and tone normal for age. No tenderness with percussion to costophrenic angles bilaterally. Abdomen: She does report some tenderness with palpation of the low abdomen midline/suprapubic area. No other areas of tenderness with palpation. Extremities/Skin: Warm and dry.  Neuro: Alert and oriented X 3. Moves all extremities spontaneously. Gait is normal. CNII-XII grossly in tact. Psych:  Responds to questions appropriately with a normal affect.   Results for orders placed or performed in visit on 01/12/16  Urinalysis, Routine w reflex microscopic (not at [facility])  Result Value Ref Range   Color, Urine YELLOW YELLOW   APPearance CLEAR CLEAR   Specific Gravity, Urine 1.010 1.001 - 1.035   pH 6.0 5.0 - 8.0   Glucose, UA NEGATIVE NEGATIVE   Bilirubin Urine NEGATIVE NEGATIVE   Ketones, ur NEGATIVE NEGATIVE   Hgb urine dipstick TRACE (A) NEGATIVE   Protein, ur NEGATIVE NEGATIVE   Nitrite NEGATIVE NEGATIVE   Leukocytes, UA TRACE (A) NEGATIVE  Urine Microscopic  Result Value Ref Range   WBC, UA 6-10 (A) <=5 WBC/HPF   RBC / HPF 0-2 <=2 RBC/HPF   Squamous Epithelial / LPF 0-5 <=5 HPF   Bacteria, UA FEW (A) NONE SEEN HPF   Crystals NONE SEEN NONE SEEN HPF   Casts NONE SEEN NONE SEEN LPF   Yeast NONE SEEN NONE SEEN HPF     ASSESSMENT AND PLAN:  50 y.o. year old female with  1. Urinary tract infection, site not specified She has allergies to Cipro, penicillin, tetracycline. She is currently on clindamycin secondary to recent foot surgery. Therefore at this time  will treat with Bactrim. However I will send culture and follow-up culture results. Also I did discuss with patient that the urinalysis does not indicate severe UTI and that I wanted to make sure that this was a UTI and not not some type of vaginal infection or something else causing her symptoms. However she states that she has had yeast infections before in this definitely is not a yeast infection. Also states that she only feels discomfort when she urinates. Feels no type of vaginal discomfort otherwise. Therefore will go ahead and start Bactrim and then follow-up urine culture. - Urine culture -  sulfamethoxazole-trimethoprim (BACTRIM DS,SEPTRA DS) 800-160 MG tablet; Take 1 tablet by mouth 2 (two) times daily.  Dispense: 10 tablet; Refill: 0  2. Burning with urination - Urinalysis, Routine w reflex microscopic (not at [facility])   Signed, [facility] [place], [place], [facility] 01/12/2016 4:56 PM

## 2016-01-12 NOTE — Progress Notes (Signed)
Subjective:    Patient ID: [name] [name] is a 50 y.o. female.  HPI     Star Age, MD, PhD [facility] [address].O. [place], [place] [address]   Dear Dr. Dennard [name],   I saw your patient, [name] [name], upon your kind request in my neurologic clinic today for initial consultation of her sleep disorder, in particular, concern for obstructive sleep apnea. The patient is accompanied by her mother today. As you know, [name] [name] is a 50 year old right-handed woman with an underlying medical history of bipolar disorder, hypertension, chronic pain and on chronic narcotic pain medications, reflux disease, seizure history, hypertension, anxiety, asthma, and obesity, who was previously diagnosed with obstructive sleep apnea. Sleep study results are not available for my review. She was on CPAP therapy in the past but has not used it in years.  She has a Hx of chronic back pain, had multiple upper and lower back surgeries under Dr. Patrice [name]. Last surgery of upper spine in February 2017. She will be seeing pain management next week.  She had recent R big toe surgery and has to wear a boot for a total of 6 weeks, has been 2 weeks thus far. Has R knee arthroscopic surgery planned for next month, under Dr. French [name]. She had repeat O2 drops in the hospital, she recalls post-surgery. Of note, she was recently hospitalized for chest pain. Workup was negative for cardiac events. She was found to have hypokalemia. I reviewed your office note from 11/20/2015 and 11/25/2015. She has been witnessed to have apneic pauses while asleep per mom. Her Epworth sleepiness score is 17 out of 24 today, her fatigue score is 30 out of 63. She is a nonsmoker. She lives with her mother, has 3 children, is separated. She does not drink alcohol. She quit using illicit drugs in [id number]. She drinks quite a bit of caffeine in the form of sodas, 4 16 ounce cups per day.  Bedtime is around 11 PM,  then the mornings she gets out of bed around 10 AM. She does not wake up rested. She also endorses restless leg symptoms and leg twitching and kicking at night. She has a family history of restless leg syndrome and sleep apnea. Her mother has a BiPAP machine, her father had a BiPAP machine as well, he died at 77 from lung cancer.  Her Past Medical History Is Significant For: Past Medical History:  Diagnosis Date  . Anxiety   . Asthma   . Bipolar 1 disorder ([place])    ect treatments last treatment Sep 02 1011  . Depression   . GERD (gastroesophageal reflux disease)   . Hypertension   . Seizures ([place])    last seizure was so long ago she can't remember.   . Sleep apnea     Her Past Surgical History Is Significant For: Past Surgical History:  Procedure Laterality Date  . ABDOMINAL HYSTERECTOMY    . BACK SURGERY    . CARPAL TUNNEL RELEASE     x2  . Laproscopic knee surgery    . PLANTAR FASCIA RELEASE     x2    Her Family History Is Significant For: Family History  Problem Relation Age of Onset  . Diabetes Mother   . COPD Mother   . Hypertension Mother   . Hyperlipidemia Mother   . Heart disease Father   . Hyperlipidemia Father   . Hypertension Father   . Cancer Father   . Heart  disease Brother   . Heart disease Daughter     Her Social History Is Significant For: Social History   Social History  . Marital status: Legally Separated    Spouse name: N/A  . Number of children: 3  . Years of education: 12   Occupational History  . Disabled    Social History Main Topics  . Smoking status: Never Smoker  . Smokeless tobacco: Never Used  . Alcohol use No  . Drug use: No  . Sexual activity: No   Other Topics Concern  . None   Social History Narrative   Patient drink 4 16oz caffeine drinks a day     Her Allergies Are:  Allergies  Allergen Reactions  . Tetracyclines & Related Other (See Comments)    syncope  . Tramadol Other (See Comments)    seizures  .  Ciprofloxacin Rash  . Codeine Itching  . Penicillins Hives and Rash  :   Her Current Medications Are:  Outpatient Encounter Prescriptions as of 01/12/2016  Medication Sig  . AMITIZA 24 MCG capsule TAKE 1 CAPSULE TWICE A DAY WITH A MEAL  . benztropine (COGENTIN) 1 MG tablet Take 1 mg by mouth 2 (two) times daily.   . Calcium Carbonate-Vit D-Min (CALCIUM 1200 PO) Take 1 tablet by mouth daily.  . citalopram (CELEXA) 40 MG tablet Take 40 mg by mouth at bedtime.   . clindamycin (CLEOCIN) 300 MG capsule Take 1 capsule (300 mg total) by mouth 3 (three) times daily.  . clotrimazole-betamethasone (LOTRISONE) cream Apply 1 application topically 2 (two) times daily. X 2 weeks  . cyclobenzaprine (FLEXERIL) 10 MG tablet Take 10 mg by mouth 3 (three) times daily as needed for muscle spasms.  . fluticasone (FLONASE) 50 MCG/ACT nasal spray PLACE 2 SPRAYS INTO BOTH NOSTRILS DAILY.  Marland Kitchen gabapentin (NEURONTIN) 600 MG tablet Take 1 tablet (600 mg total) by mouth 3 (three) times daily. For anxiety and pain management. May have to stop to take ECT.  . haloperidol (HALDOL) 5 MG tablet Take 1 tablet (5 mg total) by mouth at bedtime. For psychosis  . hydrochlorothiazide (HYDRODIURIL) 25 MG tablet TAKE 1 TABLET EVERY DAY  . HYDROcodone-acetaminophen (NORCO) 10-325 MG per tablet Take 1 tablet by mouth every 8 (eight) hours.   . hydrOXYzine (ATARAX/VISTARIL) 10 MG tablet Take 10 mg by mouth 3 (three) times daily.   Marland Kitchen KLOR-CON M20 20 MEQ tablet TAKE 2 TABLETS BY MOUTH EVERY DAY  . morphine (MS CONTIN) 30 MG 12 hr tablet   . Multiple Vitamins-Minerals (MULTIVITAMIN WITH MINERALS) tablet Take 1 tablet by mouth daily.  . nitroGLYCERIN (NITROSTAT) 0.4 MG SL tablet Place 1 tablet (0.4 mg total) under the tongue every 5 (five) minutes as needed for chest pain.  Marland Kitchen oxyCODONE-acetaminophen (PERCOCET/ROXICET) 5-325 MG tablet Take 1 tablet by mouth every 4 (four) hours as needed for severe pain.  . pantoprazole (PROTONIX) 40 MG  tablet TAKE 1 TABLET BY MOUTH TWICE A DAY  . prazosin (MINIPRESS) 5 MG capsule Take 5 mg by mouth at bedtime.  Marland Kitchen PROAIR HFA 108 (90 BASE) MCG/ACT inhaler INHALE 2 PUFFS INTO THE LUNGS EVERY 4 HOURS AS NEEDED FOR WHEEZING  . promethazine (PHENERGAN) 12.5 MG tablet Take 1 tablet (12.5 mg total) by mouth every 8 (eight) hours as needed for nausea or vomiting.  . promethazine (PHENERGAN) 12.5 MG tablet Take 12.5 mg by mouth every 8 (eight) hours as needed for nausea or vomiting.  . SUMAtriptan (IMITREX) 100 MG tablet  TAKE 1 TABLET (100 MG TOTAL) BY MOUTH EVERY 2 (TWO) HOURS AS NEEDED. MIGRANES  . traZODone (DESYREL) 100 MG tablet Take 100 mg by mouth at bedtime.  . VOLTAREN 1 % GEL Apply 2 g topically daily as needed (for pain).   . [DISCONTINUED] oxymorphone (OPANA) 10 MG tablet Take 10 mg by mouth 2 (two) times daily as needed for pain.  . [DISCONTINUED] sulfamethoxazole-trimethoprim (BACTRIM DS,SEPTRA DS) 800-160 MG tablet Take 1 tablet by mouth 2 (two) times daily.  . [DISCONTINUED] sulfamethoxazole-trimethoprim (BACTRIM DS,SEPTRA DS) 800-160 MG tablet TAKE 1 TABLET BY MOUTH TWICE DAILY   No facility-administered encounter medications on file as of 01/12/2016.   :  Review of Systems:  Out of a complete 14 point review of systems, all are reviewed and negative with the exception of these symptoms as listed below: Review of Systems  Neurological:       Patient has trouble falling asleep, snoring reported, witnessed apnea, history of using CPAP- last used several years, wakes up feeling tired, morning headaches, daytime tiredness, takes naps during the day.   Has had prior sleep study, unknown when.   Epworth Sleepiness Scale 0= would never doze 1= slight chance of dozing 2= moderate chance of dozing 3= high chance of dozing  Sitting and reading:3 Watching TV:3 Sitting inactive in a public place (ex. Theater or meeting):2 As a passenger in a car for an hour without a break:3 Lying down to  rest in the afternoon:3 Sitting and talking to someone:1 Sitting quietly after lunch (no alcohol):2 In a car, while stopped in traffic:0 Total:17   Objective:  Neurologic Exam  Physical Exam Physical Examination:   Vitals:   01/12/16 1105  BP: 112/70  Pulse: 90  Resp: 20    General Examination: The patient is a very pleasant 50 y.o. female in no acute distress. She appears well-developed and well-nourished and well groomed.   HEENT: Normocephalic, atraumatic, pupils are equal, round and reactive to light and accommodation. Funduscopic exam is normal with sharp disc margins noted. Extraocular tracking is good without limitation to gaze excursion or nystagmus noted. Normal smooth pursuit is noted. Hearing is grossly intact. Face is symmetric with normal facial animation and normal facial sensation. Speech is clear with no dysarthria noted. There is no hypophonia. There is no lip, neck/head, jaw or voice tremor. Neck is supple with full range of passive and active motion. There are no carotid bruits on auscultation. Oropharynx exam reveals: mild mouth dryness, adequate dental hygiene and moderate airway crowding, due to small airway entry, redundant soft palate and tonsils in place, R more visible than L. Mallampati is class II. Tongue protrudes centrally and palate elevates symmetrically. Tonsils are 2+ in size. Neck size is 15.5 inches. She has a Mild overbite. Nasal inspection reveals no significant nasal mucosal bogginess or redness and no septal deviation, but small nasal anatomy.   Chest: Clear to auscultation without wheezing, rhonchi or crackles noted.  Heart: S1+S2+0, regular and normal without murmurs, rubs or gallops noted.   Abdomen: Soft, non-tender and non-distended with normal bowel sounds appreciated on auscultation.  Extremities: There is no pitting edema in the distal lower extremities bilaterally. Pedal pulses are intact.  Skin: Warm and dry without trophic changes  noted.   Musculoskeletal: exam reveals no obvious joint deformities, tenderness or joint swelling or erythema, except for R knee pain and R leg in boot from recent toe surgery.   Neurologically:  Mental status: The patient is awake, alert and  oriented in all 4 spheres. Her immediate and remote memory, attention, language skills and fund of knowledge are appropriate. There is no evidence of aphasia, agnosia, apraxia or anomia. Speech is clear with normal prosody and enunciation. Thought process is linear. Mood is normal and affect is normal.  Cranial nerves II - XII are as described above under HEENT exam. In addition: shoulder shrug is normal with equal shoulder height noted. Motor exam: Normal bulk, strength and tone is noted. There is no drift, tremor or rebound. Romberg is negative. Reflexes are 2+ throughout. Fine motor skills and coordination: intact with normal finger taps, normal hand movements, normal rapid alternating patting, normal foot taps and normal foot agility.  Cerebellar testing: No dysmetria or intention tremor on finger to nose testing. Heel to shin is unremarkable bilaterally. There is no truncal or gait ataxia.  Sensory exam: intact to light touch, pinprick, vibration, temperature sense in the upper extremities, decreased to all modalities in the L leg, distal 2/3, R leg in boot.  Gait, station and balance: She stands with difficulty. No veering to one side is noted. No leaning to one side is noted. Posture is age-appropriate and stance is narrow based. Gait shows a limp and she walks with crutches.   Assessment and Plan:  In summary, NAJAI JOLIVET is a very pleasant 50 y.o.-year old female with an underlying medical history of bipolar disorder, hypertension, chronic pain and on chronic narcotic pain medications, reflux disease, seizure history, hypertension, anxiety, asthma, and obesity, whose history and physical exam are in keeping with obstructive sleep apnea (OSA). I had a  long chat with the patient and her mother about my findings and the diagnosis of OSA, its prognosis and treatment options. We talked about medical treatments, surgical interventions and non-pharmacological approaches. I explained in particular the risks and ramifications of untreated moderate to severe OSA, especially with respect to developing cardiovascular disease down the Road, including congestive heart failure, difficult to treat hypertension, cardiac arrhythmias, or stroke. Even type 2 diabetes has, in part, been linked to untreated OSA. Symptoms of untreated OSA include daytime sleepiness, memory problems, mood irritability and mood disorder such as depression and anxiety, lack of energy, as well as recurrent headaches, especially morning headaches. We talked about trying to maintain a healthy lifestyle in general, as well as the importance of weight control. I encouraged the patient to eat healthy, exercise daily and keep well hydrated, to keep a scheduled bedtime and wake time routine, to not skip any meals and eat healthy snacks in between meals. I advised the patient not to drive when feeling sleepy. I recommended the following at this time: sleep study with potential positive airway pressure titration. (We will score hypopneas at 4% and split the sleep study into diagnostic and treatment portion, if the estimated. 2 hour AHI is >20/h).  We mutually agreed to schedule the sleep study after she has been off of the boot and after her knee surgery which is planned for 01/28/2016.  I explained the sleep test procedure to the patient and also outlined possible surgical and non-surgical treatment options of OSA, including the use of a custom-made dental device (which would require a referral to a specialist dentist or oral surgeon), upper airway surgical options, such as pillar implants, radiofrequency surgery, tongue base surgery, and UPPP (which would involve a referral to an ENT surgeon). Rarely, jaw  surgery such as mandibular advancement may be considered.  I also explained the CPAP treatment option to the  patient, who indicated that she would be willing to try CPAP if the need arises. I explained the importance of being compliant with PAP treatment, not only for insurance purposes but primarily to improve Her symptoms, and for the patient's long term health benefit, including to reduce Her cardiovascular risks. I answered all their questions today and the patient and her mother were in agreement. I would like to see her back after the sleep study is completed and encouraged her to call with any interim questions, concerns, problems or updates.   Thank you very much for allowing me to participate in the care of this nice patient. If I can be of any further assistance to you please do not hesitate to call me at [phone].  Sincerely,   Star Age, MD, PhD

## 2016-01-12 NOTE — Patient Instructions (Signed)

## 2016-01-13 ENCOUNTER — Telehealth: Payer: Self-pay | Admitting: *Deleted

## 2016-01-13 NOTE — Telephone Encounter (Addendum)
-----   Message from [name], DPM sent at 01/11/2016 11:29 AM EDT ----- Can you please let her know that her blood work is normal. Thanks. 01/13/2016-Left message informing pt of Dr. Leigh Aurora review of labs.

## 2016-01-14 LAB — URINE CULTURE: Organism ID, Bacteria: NO GROWTH

## 2016-01-19 ENCOUNTER — Ambulatory Visit (INDEPENDENT_AMBULATORY_CARE_PROVIDER_SITE_OTHER): Payer: Medicare Other | Admitting: Podiatry

## 2016-01-19 ENCOUNTER — Other Ambulatory Visit: Payer: Self-pay | Admitting: Podiatry

## 2016-01-19 ENCOUNTER — Ambulatory Visit (INDEPENDENT_AMBULATORY_CARE_PROVIDER_SITE_OTHER): Payer: Medicare Other

## 2016-01-19 DIAGNOSIS — M2031 Hallux varus (acquired), right foot: Secondary | ICD-10-CM | POA: Diagnosis not present

## 2016-01-19 DIAGNOSIS — Z09 Encounter for follow-up examination after completed treatment for conditions other than malignant neoplasm: Secondary | ICD-10-CM | POA: Diagnosis not present

## 2016-01-19 NOTE — Progress Notes (Signed)
Subjective: [name] [name] is a 50 y.o. is seen today in office s/p right hallux IPJ fusion preformed on 12/31/15. She gets some throbbing, burning pain to her toe at times that she states that this is tolerable and she is not taking any pain medication. She is continuing the CAM boot. She finishes her course of antibiotics tomorrow. She is requesting a surgical shoe today as she has upcoming surgery on her right knee neck supple weeks. Denies any systemic complaints such as fevers, chills, nausea, vomiting. No calf pain, chest pain, shortness of breath.   Objective: General: No acute distress, AAOx3  DP/PT pulses palpable 2/4, CRT < 3 sec to all digits.  Motor function intact.  Right foot: Incision is well coapted without any evidence of dehiscence and sutures are intact. K wires are intact the distal aspect of the toe any redness or drainage. There is no surrounding erythema, ascending cellulitis, fluctuance, crepitus, malodor, drainage/purulence. There is mild edema around the surgical site. There is minimal pain  To palpation along the surgical site. Toe in rectus position.  No other areas of tenderness to bilateral lower extremities.  No other open lesions or pre-ulcerative lesions.  No pain with calf compression, swelling, warmth, erythema.   Assessment and Plan:  Status post right foot IPJ fusion, doing well with no complications   -Treatment options discussed including all alternatives, risks, and complications -Sutures removed today without complications. And buttock ointment and a bandage was applied. Continue in about ointment along the K wire sites. -Surgical shoe dispensed today. -Elevation Finish course of antibiotic. She notices any increase in redness or swelling or any pain to the office and we'll restart antibiotics.- -Pain medication as needed. -Monitor for any clinical signs or symptoms of infection and DVT/PE and directed to call the office immediately should any occur or  go to the ER. -Follow-up as scheduled or sooner if any problems arise. In the meantime, encouraged to call the office with any questions, concerns, change in symptoms.   Celesta Gentile, DPM

## 2016-01-20 ENCOUNTER — Other Ambulatory Visit: Payer: Medicare Other

## 2016-01-21 ENCOUNTER — Telehealth (HOSPITAL_COMMUNITY): Payer: Self-pay | Admitting: Physical Therapy

## 2016-01-21 NOTE — Telephone Encounter (Signed)
have surgery on 01/27/2006, faxed a new request to Dr. French Ana to be signed for S/P Right Knee Arthroscopy today. 01/05/16 NF Patient requested to be seen at [facility]. I called Kisha at [facility] [employer], they have cx all future appointment at their office and pt will be seen at [facility] in [place], the office near pt's home on 02/02/2016 @2 :30pm. Referral is under Media Tab in the pt's chart. NF 01/05/16

## 2016-01-22 DIAGNOSIS — M4326 Fusion of spine, lumbar region: Secondary | ICD-10-CM | POA: Diagnosis not present

## 2016-01-22 DIAGNOSIS — G894 Chronic pain syndrome: Secondary | ICD-10-CM | POA: Diagnosis not present

## 2016-01-22 DIAGNOSIS — M4325 Fusion of spine, thoracolumbar region: Secondary | ICD-10-CM | POA: Diagnosis not present

## 2016-01-22 DIAGNOSIS — M545 Low back pain: Secondary | ICD-10-CM | POA: Diagnosis not present

## 2016-01-24 IMAGING — CR DG CHEST 2V
2 series · 2 of 2 positions shown · non-contrast
Comparison: 03/23/2012 and prior radiographs.  12/12/2013 CT

CLINICAL DATA: Acute left chest pain and jaw pain today.

EXAM:
CHEST  2 VIEW

[chest pa]
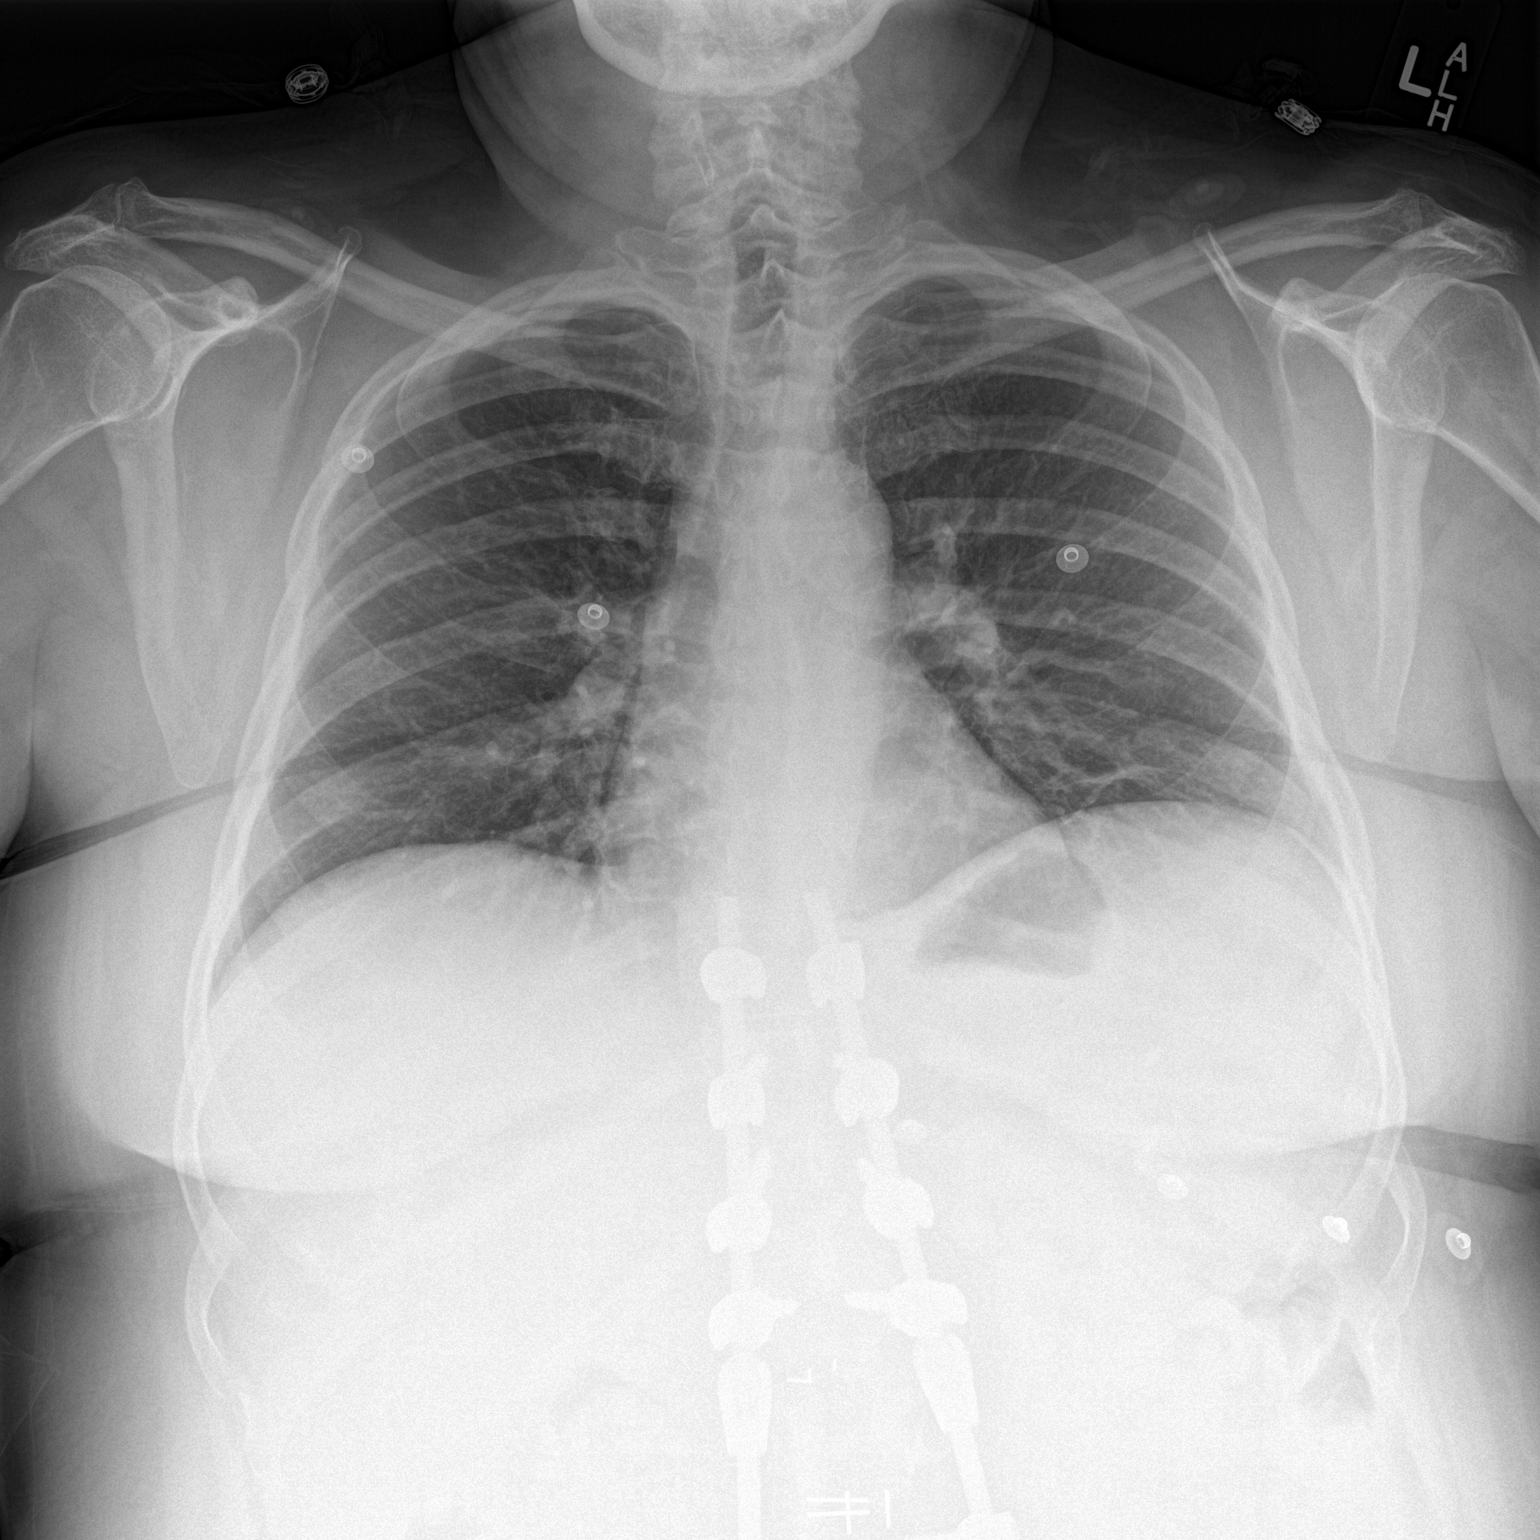

[chest lat]
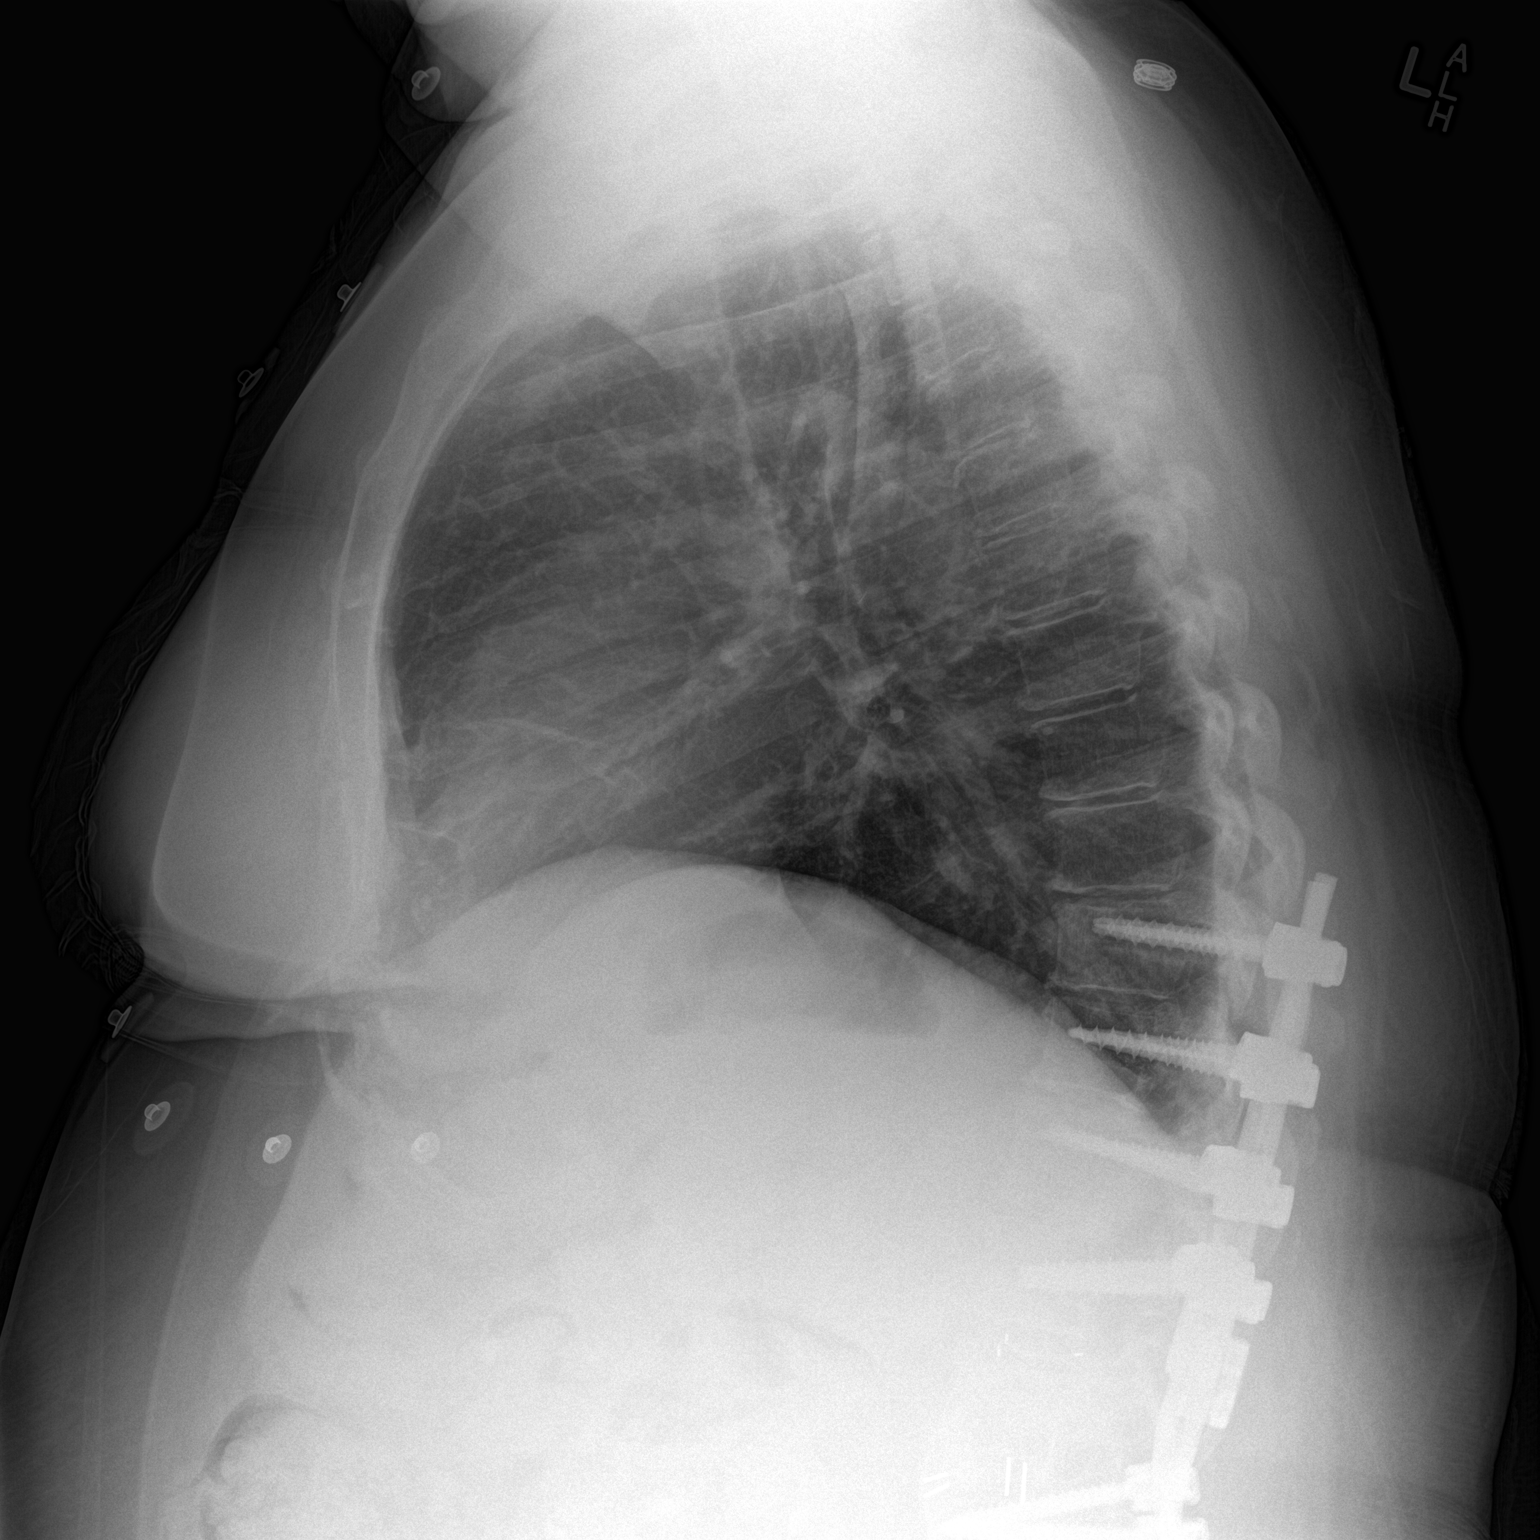

[2 of 2 positions shown; findings below may reference images not displayed]

FINDINGS: The cardiomediastinal silhouette is unremarkable.

Subsegmental left basilar atelectasis is identified.

There is no evidence of focal airspace disease, pulmonary edema,
suspicious pulmonary nodule/mass, pleural effusion, or pneumothorax.
No acute bony abnormalities are identified.

Thoracolumbar surgical hardware again noted.
IMPRESSION: Minimal left basilar atelectasis without other significant
abnormality.

## 2016-01-24 IMAGING — CT CT ANGIO CHEST
2 of 9 series · 18 of 46 positions shown · IV contrast (omnipaque)
Comparison: 12/12/2013

CLINICAL DATA: Left chest pain radiating to left shoulder,
shortness of Breath

EXAM:
CT ANGIOGRAPHY CHEST WITH CONTRAST
TECHNIQUE: Multidetector CT imaging of the chest was performed using the
standard protocol during bolus administration of intravenous
contrast. Multiplanar CT image reconstructions and MIPs were
obtained to evaluate the vascular anatomy.
CONTRAST:  75mL OMNIPAQUE IOHEXOL 350 MG/ML SOLN

[Series 5: thins · axial · 0.81mm/px · z∈[+1023,+1228]mm · 15 of 231 slices shown]
[im 13/231  lung]
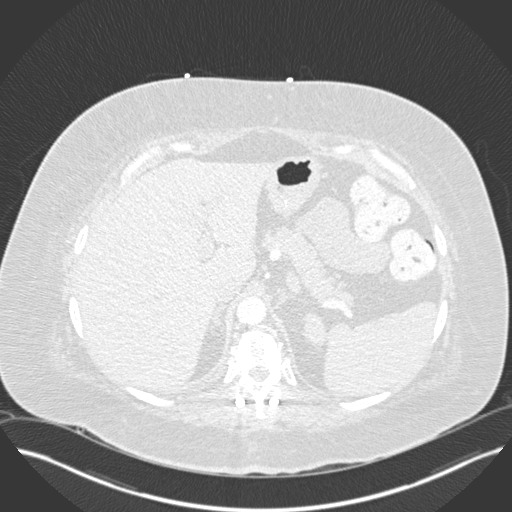
[im 26/231  soft-tissue]
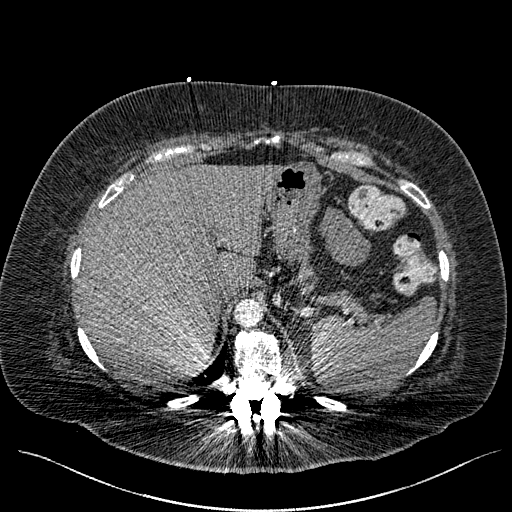
[im 39/231  lung]
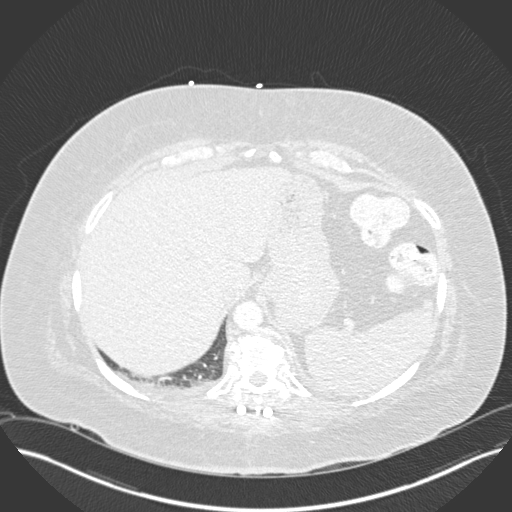
[im 52/231  soft-tissue]
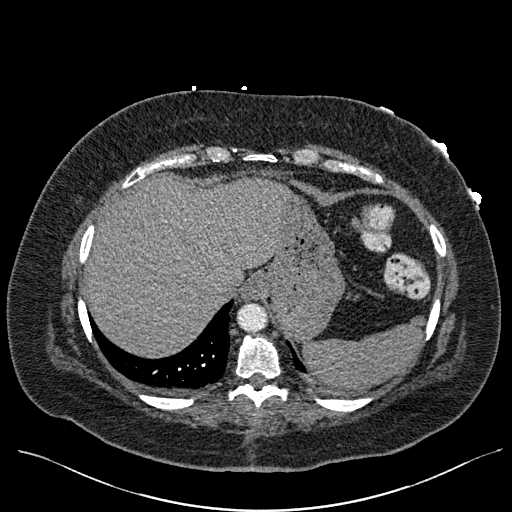
[im 77/231  lung]
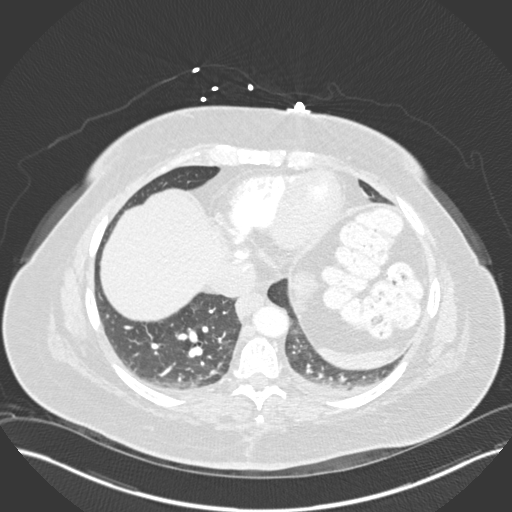
[im 90/231  soft-tissue]
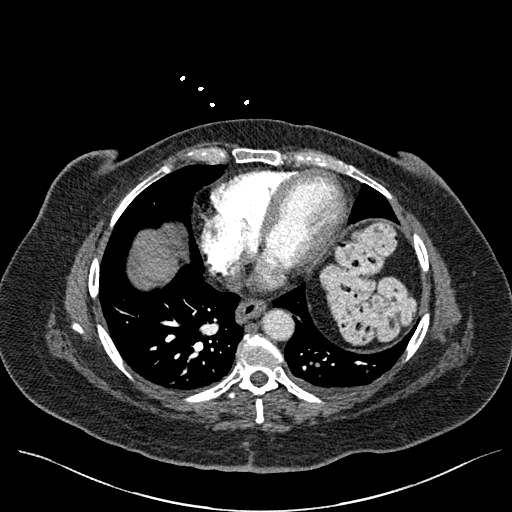
[im 103/231  lung]
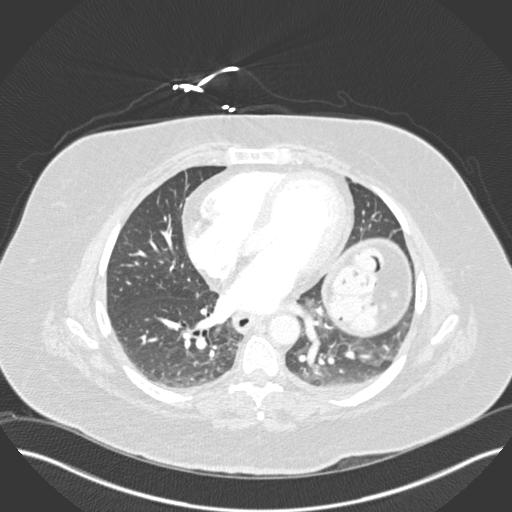
[im 116/231  soft-tissue]
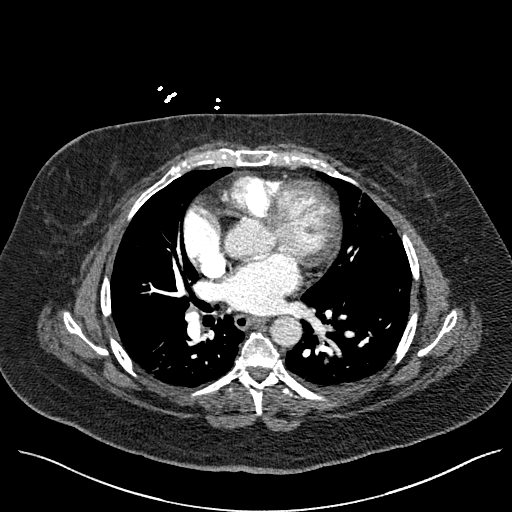
[im 128/231  lung]
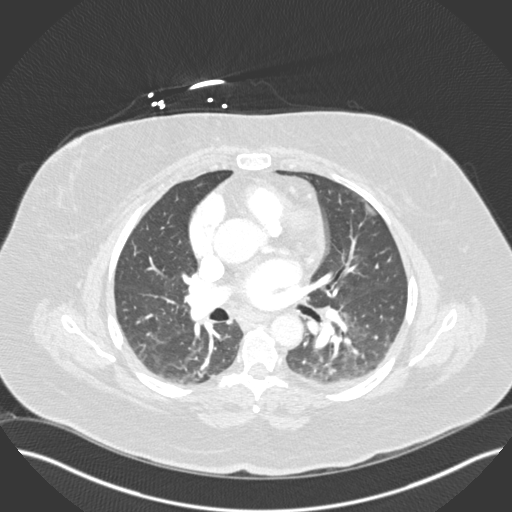
[im 141/231  soft-tissue]
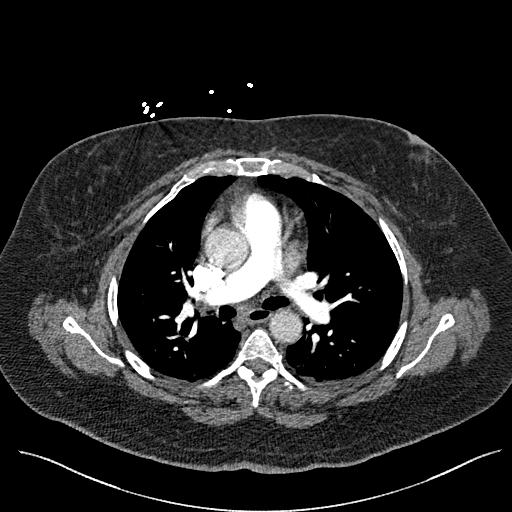
[im 154/231  lung]
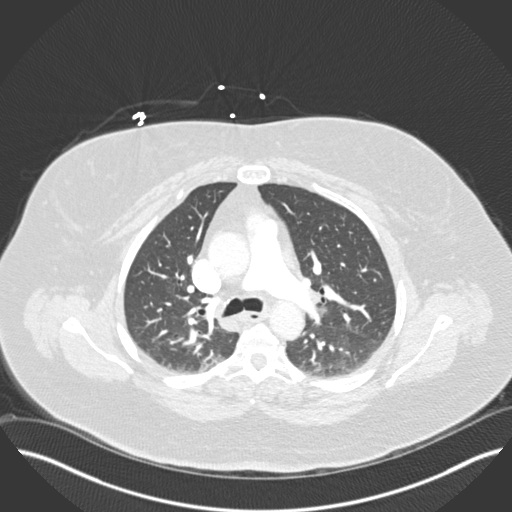
[im 179/231  soft-tissue]
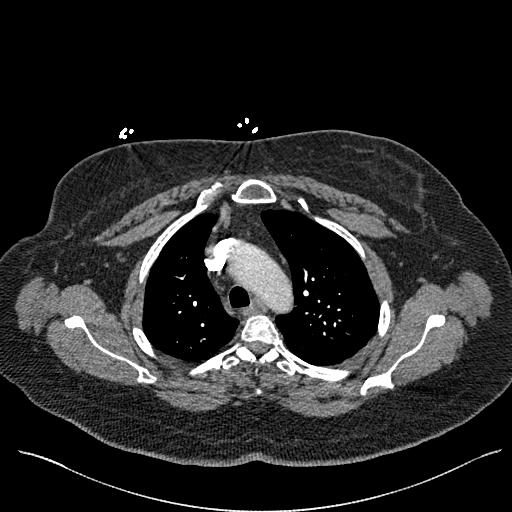
[im 192/231  lung]
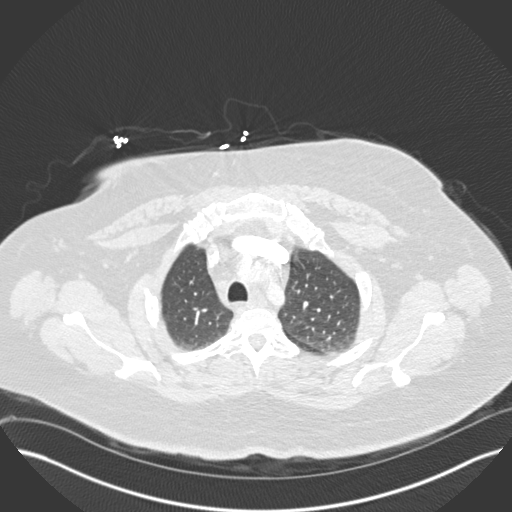
[im 205/231  soft-tissue]
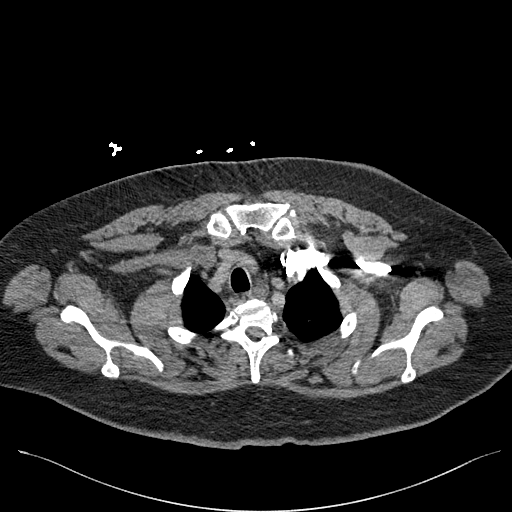
[im 218/231  lung]
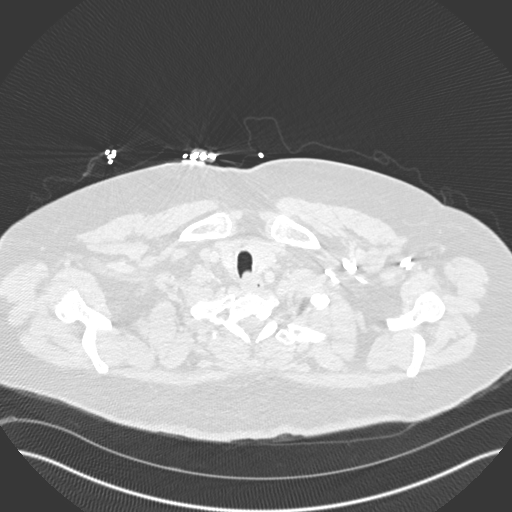

[Series 7: coronal mpr · coronal · 0.55mm/px · 3 of 128 slices shown]
[im 32/128  soft-tissue]
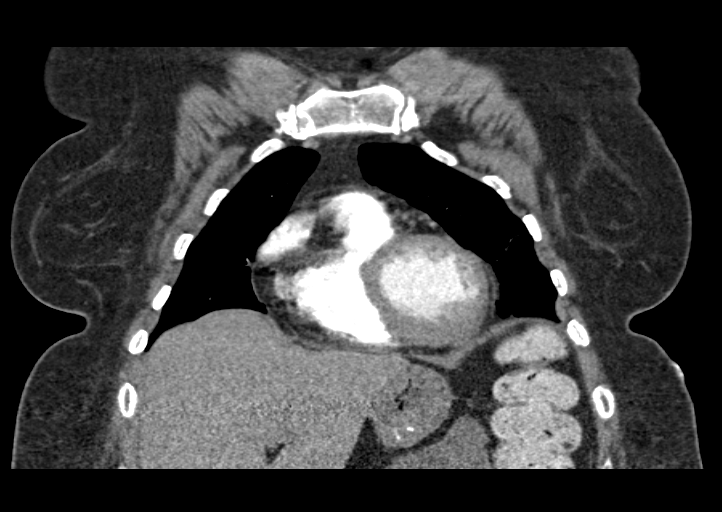
[im 64/128  soft-tissue]
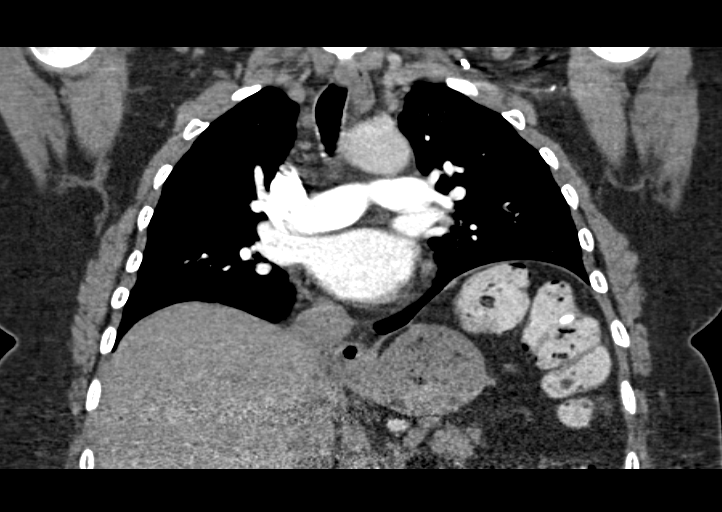
[im 96/128  soft-tissue]
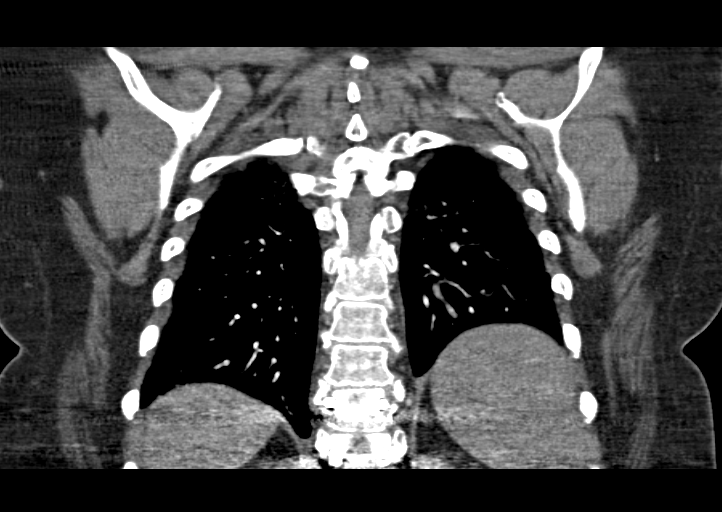

[18 of 46 positions shown; findings below may reference images not displayed]

FINDINGS: Left arm contrast injection. SVC patent. The right ventricle is
nondilated. Satisfactory opacification of pulmonary arteries noted,
and there is no evidence of pulmonary emboli. Patent bilateral
pulmonary veins. Fair contrast opacification of the thoracic aorta,
without evidence of dissection or stenosis. There is 3 vessel
brachiocephalic arterial origin anatomy without proximal stenosis.
No significant atheromatous plaque.

Tiny bilateral pleural effusions. No pericardial effusion. No
pneumothorax. No hilar or mediastinal adenopathy. Subsegmental
atelectasis posteriorly in both lungs, left greater than right. No
focal consolidation or nodule. Minimal spurring in the mid thoracic
spine. Lower thoracic spinal fixation hardware is noted,
incompletely visualized distally. Sternum intact. Mild elevation of
the left diaphragmatic leaflet. Visualized portions of upper abdomen
unremarkable.

Review of the MIP images confirms the above findings.
IMPRESSION: 1. Negative for acute PE or thoracic aortic dissection.

## 2016-01-27 ENCOUNTER — Ambulatory Visit (HOSPITAL_COMMUNITY): Payer: Medicare Other | Admitting: Physical Therapy

## 2016-01-28 DIAGNOSIS — M2241 Chondromalacia patellae, right knee: Secondary | ICD-10-CM | POA: Diagnosis not present

## 2016-01-28 DIAGNOSIS — G8918 Other acute postprocedural pain: Secondary | ICD-10-CM | POA: Diagnosis not present

## 2016-01-28 DIAGNOSIS — M1711 Unilateral primary osteoarthritis, right knee: Secondary | ICD-10-CM | POA: Diagnosis not present

## 2016-01-29 ENCOUNTER — Other Ambulatory Visit: Payer: Self-pay | Admitting: Family Medicine

## 2016-01-29 MED ORDER — POTASSIUM CHLORIDE CRYS ER 20 MEQ PO TBCR: 40.0000 meq | EXTENDED_RELEASE_TABLET | Freq: Every day | ORAL | 3 refills | Status: DC

## 2016-02-02 ENCOUNTER — Ambulatory Visit (HOSPITAL_COMMUNITY): Payer: Medicare Other | Admitting: Physical Therapy

## 2016-02-02 ENCOUNTER — Ambulatory Visit: Payer: Medicare Other | Admitting: Physical Therapy

## 2016-02-04 ENCOUNTER — Other Ambulatory Visit: Payer: Self-pay | Admitting: Family Medicine

## 2016-02-04 MED ORDER — POTASSIUM CHLORIDE CRYS ER 20 MEQ PO TBCR: 40.0000 meq | EXTENDED_RELEASE_TABLET | Freq: Every day | ORAL | 3 refills | Status: DC

## 2016-02-10 ENCOUNTER — Telehealth (HOSPITAL_COMMUNITY): Payer: Self-pay

## 2016-02-10 NOTE — Telephone Encounter (Signed)
02/10/16 she cx and I didn't ask if she wanted to RS but I will call her back and ask her.

## 2016-02-11 ENCOUNTER — Ambulatory Visit (HOSPITAL_COMMUNITY): Payer: Medicare Other | Admitting: Physical Therapy

## 2016-02-11 DIAGNOSIS — M5030 Other cervical disc degeneration, unspecified cervical region: Secondary | ICD-10-CM | POA: Diagnosis not present

## 2016-02-11 DIAGNOSIS — Z79891 Long term (current) use of opiate analgesic: Secondary | ICD-10-CM | POA: Diagnosis not present

## 2016-02-11 DIAGNOSIS — M542 Cervicalgia: Secondary | ICD-10-CM | POA: Diagnosis not present

## 2016-02-11 DIAGNOSIS — Z79899 Other long term (current) drug therapy: Secondary | ICD-10-CM | POA: Diagnosis not present

## 2016-02-11 DIAGNOSIS — M549 Dorsalgia, unspecified: Secondary | ICD-10-CM | POA: Diagnosis not present

## 2016-02-11 DIAGNOSIS — G894 Chronic pain syndrome: Secondary | ICD-10-CM | POA: Diagnosis not present

## 2016-02-11 DIAGNOSIS — M533 Sacrococcygeal disorders, not elsewhere classified: Secondary | ICD-10-CM | POA: Diagnosis not present

## 2016-02-12 DIAGNOSIS — M2241 Chondromalacia patellae, right knee: Secondary | ICD-10-CM | POA: Diagnosis not present

## 2016-02-13 ENCOUNTER — Ambulatory Visit (INDEPENDENT_AMBULATORY_CARE_PROVIDER_SITE_OTHER): Payer: Medicare Other | Admitting: Podiatry

## 2016-02-13 ENCOUNTER — Ambulatory Visit (INDEPENDENT_AMBULATORY_CARE_PROVIDER_SITE_OTHER): Payer: Medicare Other

## 2016-02-13 VITALS — Temp 98.8°F

## 2016-02-13 DIAGNOSIS — L02611 Cutaneous abscess of right foot: Secondary | ICD-10-CM

## 2016-02-13 DIAGNOSIS — Z09 Encounter for follow-up examination after completed treatment for conditions other than malignant neoplasm: Secondary | ICD-10-CM

## 2016-02-13 DIAGNOSIS — M2031 Hallux varus (acquired), right foot: Secondary | ICD-10-CM

## 2016-02-13 DIAGNOSIS — L03031 Cellulitis of right toe: Secondary | ICD-10-CM

## 2016-02-13 MED ORDER — CLINDAMYCIN HCL 300 MG PO CAPS: 300.0000 mg | ORAL_CAPSULE | Freq: Three times a day (TID) | ORAL | 2 refills | Status: DC

## 2016-02-15 NOTE — Progress Notes (Signed)
Subjective: [name] [name] is a 50 y.o. is seen today in office s/p right hallux IPJ fusion preformed on 12/31/15. She states that she is doing "alright". She states that she is started noticing some redness the top of the big toe over the last 2 days and has been somewhat worsening. She denies any red streaks. She denies any drainage or pus to the K wire sites. Since her last appointment she did undergo back surgery as well. Denies any systemic complaints such as fevers, chills, nausea, vomiting. No calf pain, chest pain, shortness of breath.   Objective: General: No acute distress, AAOx3  DP/PT pulses palpable 2/4, CRT < 3 sec to all digits.  Motor function intact.  Right foot: Incision is well coapted without any evidence of dehiscence and a scar is formed. K wires are intact. There does appear to be localized edema and erythema to the dorsal aspect of the hallux without any ascending cellulitis. There is a mild increase in warmth of the foot however there is no cigarettes the dorsal foot. There is no open lesion or pre-ulcer lesions identified at this time. The toe sits in a rectus position. There is no fluctuance or crepitus or any malodor. No other open lesions or pre-ulcerative lesions.  No pain with calf compression, swelling, warmth, erythema.   Assessment and Plan:  Status post right foot IPJ fusion, with localized cellulitis   -Treatment options discussed including all alternatives, risks, and complications -X-rays were obtained and reviewed. K wire intact 2. There is minimal consolidation across the hallux IPJ arthrodesis site -K wires removed today. Upon expression of the area there is no pus able to be expressed. Antibody on it was applied followed by a bandage. We will start clindamycin as well and this was sent to her pharmacy. -Continue surgical shoe for now. Once the infection it is all she can go back to regular shoe. -I will see her back in 1 week or sooner if needed. Call any  questions or concerns. Directed to monitor for any signs or symptoms of worsening infection and call the office immediately she verbally understood this.  Celesta Gentile, DPM

## 2016-02-18 ENCOUNTER — Other Ambulatory Visit: Payer: Self-pay | Admitting: Family Medicine

## 2016-02-20 ENCOUNTER — Telehealth: Payer: Self-pay | Admitting: *Deleted

## 2016-02-20 NOTE — Telephone Encounter (Addendum)
Pt states she doesn't understand the instructions for her antibiotic, was she to have 2 different antibiotics or take the same antibiotic twice. 02/20/2016-Left message informing pt she was to only be on Clindamycin, and to please call with status. Pt called back states foot does look better, and will talk to [place] Monday.

## 2016-02-23 ENCOUNTER — Ambulatory Visit (INDEPENDENT_AMBULATORY_CARE_PROVIDER_SITE_OTHER): Payer: Medicare Other | Admitting: Podiatry

## 2016-02-23 ENCOUNTER — Encounter: Payer: Self-pay | Admitting: Podiatry

## 2016-02-23 DIAGNOSIS — M2031 Hallux varus (acquired), right foot: Secondary | ICD-10-CM

## 2016-02-23 DIAGNOSIS — Z09 Encounter for follow-up examination after completed treatment for conditions other than malignant neoplasm: Secondary | ICD-10-CM

## 2016-02-23 NOTE — Progress Notes (Signed)
Subjective: [name] [name] is a 50 y.o. is seen today in office s/p right hallux IPJ fusion preformed on 12/31/15. She has been on clindamycin and she states that the redness has much improved. She is not having any pain. She denies any red streaks. She has returned to regular shoe without any problems. Denies any systemic complaints such as fevers, chills, nausea, vomiting. No calf pain, chest pain, shortness of breath.   Objective: General: No acute distress, AAOx3  DP/PT pulses palpable 2/4, CRT < 3 sec to all digits.  Motor function intact.  Right foot: Incision is well coapted without any evidence of dehiscence and a scar is formed. There is significantly. Erythema to the dorsal aspect of the right hallux. Mild edema does continue. There is no ascending cellulitis. There is no fluctuance or crepitus. The scar is well-healed and there is no drainage or pus. There is no tenderness palpation to the toes in rectus position. No other open lesions or pre-ulcerative lesions.  No pain with calf compression, swelling, warmth, erythema.   Assessment and Plan:  Status post right foot IPJ fusion, with improved cellulitis   -Treatment options discussed including all alternatives, risks, and complications -Infection appears to be much improved. Finish course of clindamycin. Monitor for any signs of symptoms of worsening infection and directed to call the office should any occur or go to the ER.  -Continue regular shoe as tolerated. Monitor for any skin breakdown.  -Follow-up in 2 weeks or sooner if any problems arise. In the meantime, encouraged to call the office with any questions, concerns, change in symptoms.   Celesta Gentile, DPM

## 2016-02-24 ENCOUNTER — Telehealth: Payer: Self-pay | Admitting: *Deleted

## 2016-02-24 NOTE — Telephone Encounter (Addendum)
Pt states she forgot to ask Dr.Wagoner for her "pheno pen". Left message for pt to call with more information on the medication she was requesting. 02/25/2016-Pt states she uses the Phenergan for nausea from the Clindamycin. Dr. Jacqualyn Posey states refill as previously. Pt is informed.

## 2016-02-24 NOTE — Telephone Encounter (Signed)
I am not sure she is asking for.

## 2016-02-25 ENCOUNTER — Other Ambulatory Visit: Payer: Self-pay | Admitting: Podiatry

## 2016-02-25 MED ORDER — PROMETHAZINE HCL 12.5 MG PO TABS: 12.5000 mg | ORAL_TABLET | Freq: Three times a day (TID) | ORAL | 0 refills | Status: DC | PRN

## 2016-03-01 ENCOUNTER — Ambulatory Visit (INDEPENDENT_AMBULATORY_CARE_PROVIDER_SITE_OTHER): Payer: Medicare Other | Admitting: Neurology

## 2016-03-01 DIAGNOSIS — G4733 Obstructive sleep apnea (adult) (pediatric): Secondary | ICD-10-CM | POA: Diagnosis not present

## 2016-03-01 DIAGNOSIS — G4761 Periodic limb movement disorder: Secondary | ICD-10-CM

## 2016-03-02 IMAGING — MR MR HEAD WO/W CM
13 series · 39 of 48 positions shown · IV contrast (multihance)
Comparison: Head CT 03/23/2012

CLINICAL DATA: Dizziness and tinnitus in both ears.

EXAM:
MRI HEAD WITHOUT AND WITH CONTRAST
TECHNIQUE: Multiplanar, multiecho pulse sequences of the brain and surrounding
structures were obtained without and with intravenous contrast.
CONTRAST:  20mL MULTIHANCE GADOBENATE DIMEGLUMINE 529 MG/ML IV SOLN

[Series 2: T1 · sagittal · 5.0mm · 0.45mm/px · 3 of 21 slices shown (1 of 3)]
[im 1/21]
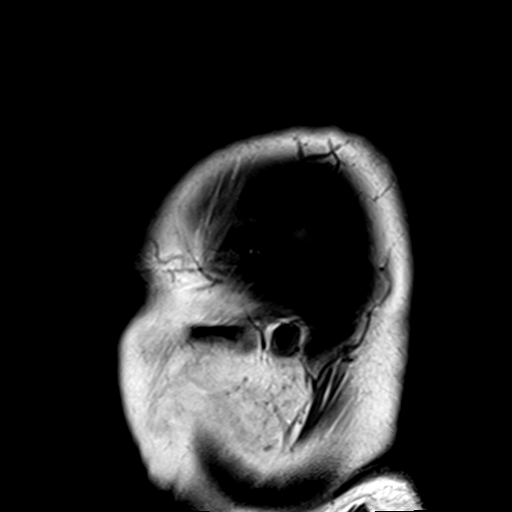
[im 11/21]
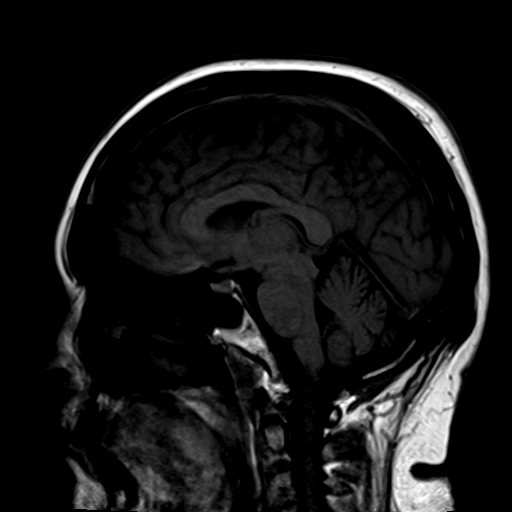
[im 21/21]
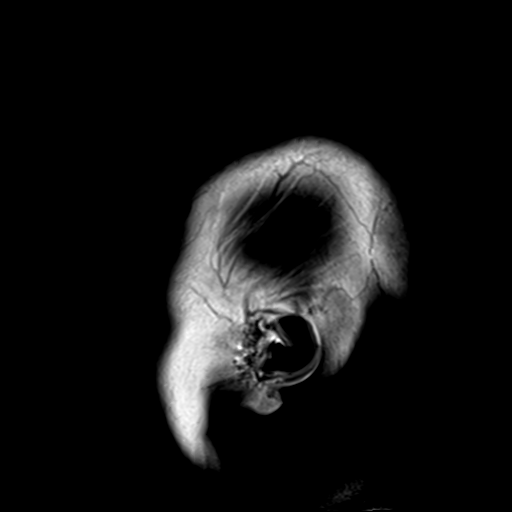

[Series 3: DWI · axial · 3.0mm · 1.80mm/px · z∈[-78,+69]mm · 8 of 100 slices shown (1 of 4)]
[im 1/100]
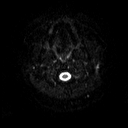
[im 12/100]
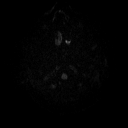
[im 34/100]
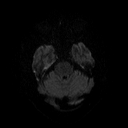
[im 45/100]
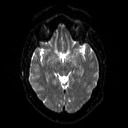
[im 56/100]
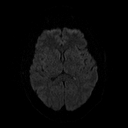
[im 67/100]
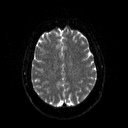
[im 89/100]
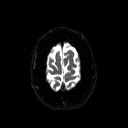
[im 100/100]
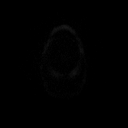

[Series 4: DWI · axial · 3.0mm · 1.80mm/px · z∈[-78,+69]mm · 5 of 49 slices shown (2 of 4)]
[im 1/49]
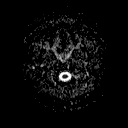
[im 13/49]
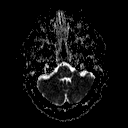
[im 25/49]
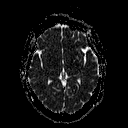
[im 37/49]
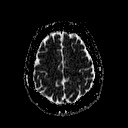
[im 49/49]
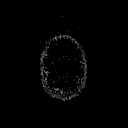

[Series 5: DWI · coronal · 5.0mm · 1.80mm/px · 7 of 68 slices shown (3 of 4)]
[im 1/68]
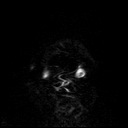
[im 12/68]
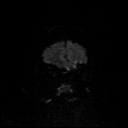
[im 23/68]
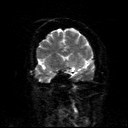
[im 34/68]
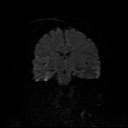
[im 45/68]
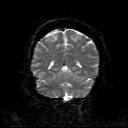
[im 56/68]
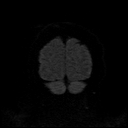
[im 68/68]
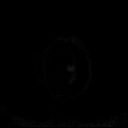

[Series 6: DWI · coronal · 5.0mm · 1.80mm/px · 3 of 34 slices shown (4 of 4)]
[im 1/34]
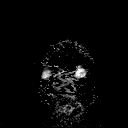
[im 17/34]
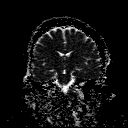
[im 34/34]
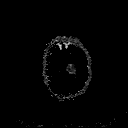

[Series 7: T2 · axial · 5.0mm · 0.45mm/px · z∈[-78,+69]mm · 2 of 22 slices shown]
[im 1/22]
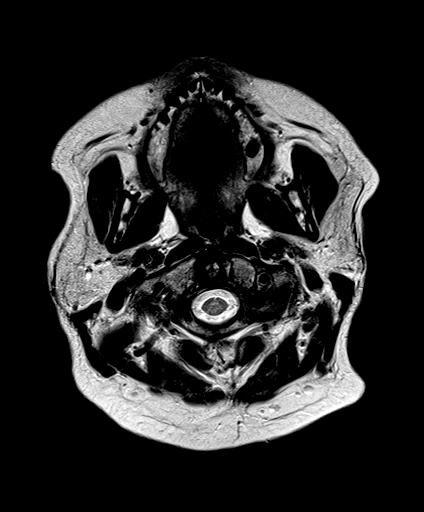
[im 22/22]
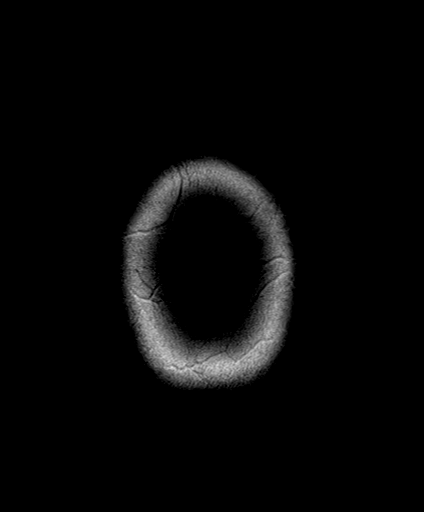

[Series 8: FLAIR · axial · 5.0mm · 0.45mm/px · z∈[-78,+69]mm · 2 of 22 slices shown]
[im 1/22]
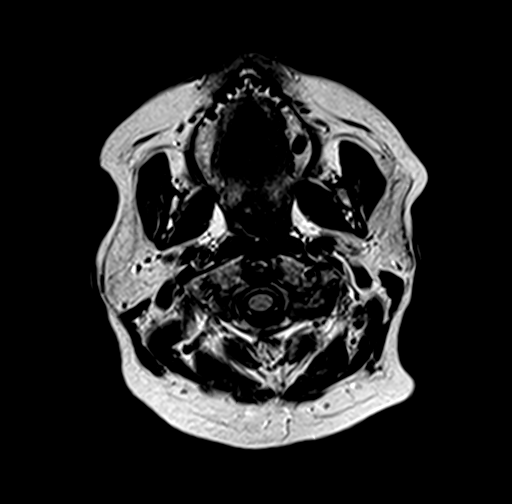
[im 22/22]
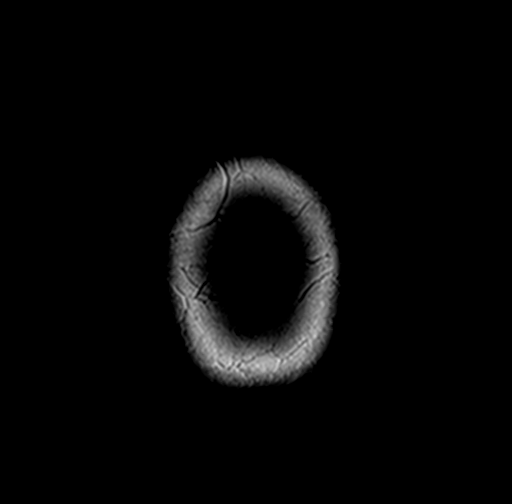

[Series 9: T1 · coronal · 3.0mm · 0.35mm/px · 1 of 11 slices shown (2 of 3)]
[im 1/11]
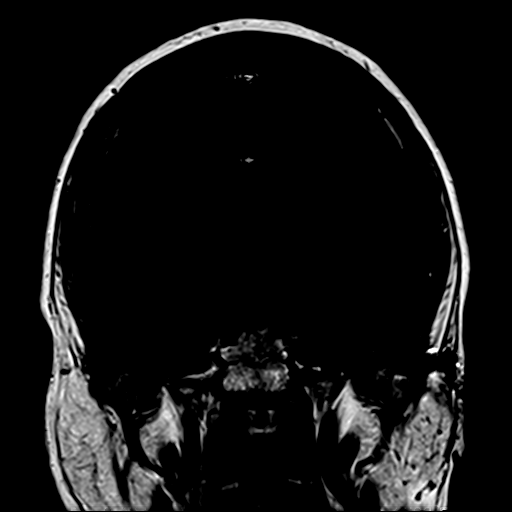

[Series 10: T1 · axial · 3.0mm · 0.35mm/px · 1 of 11 slices shown (3 of 3)]
[im 1/11]
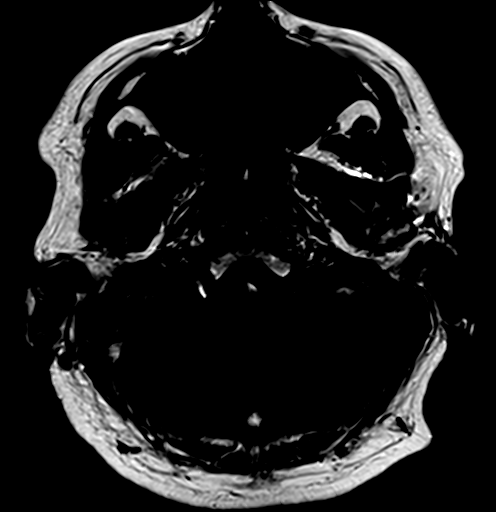

[Series 11: bSSFP · axial · 1.0mm · 0.28mm/px · z∈[-57,-18]mm · 4 of 40 slices shown]
[im 1/40]
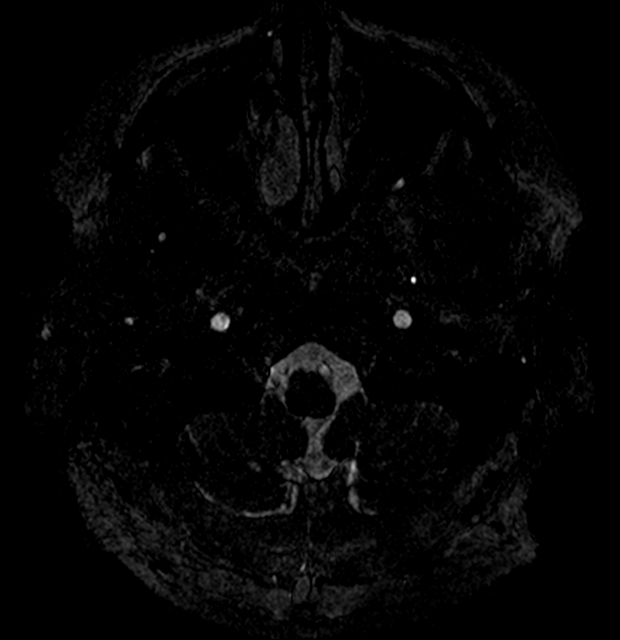
[im 14/40]
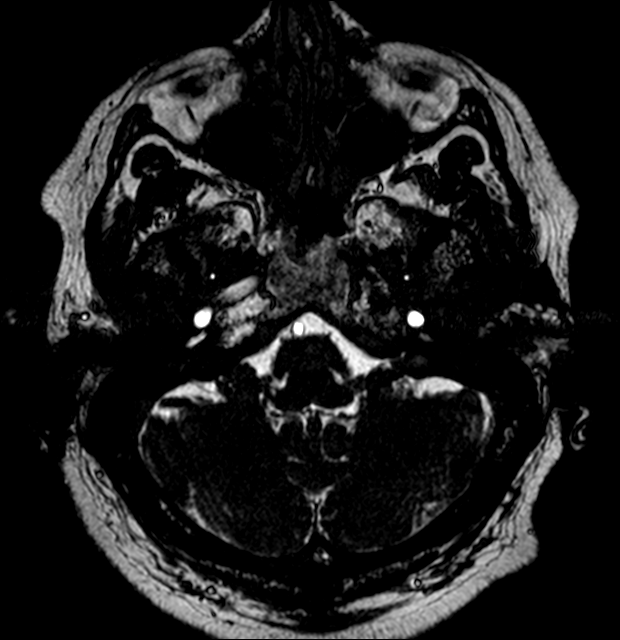
[im 27/40]
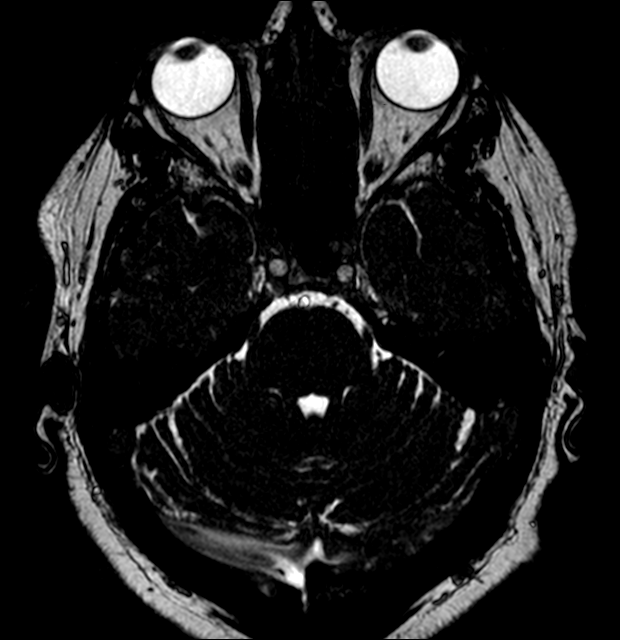
[im 40/40]
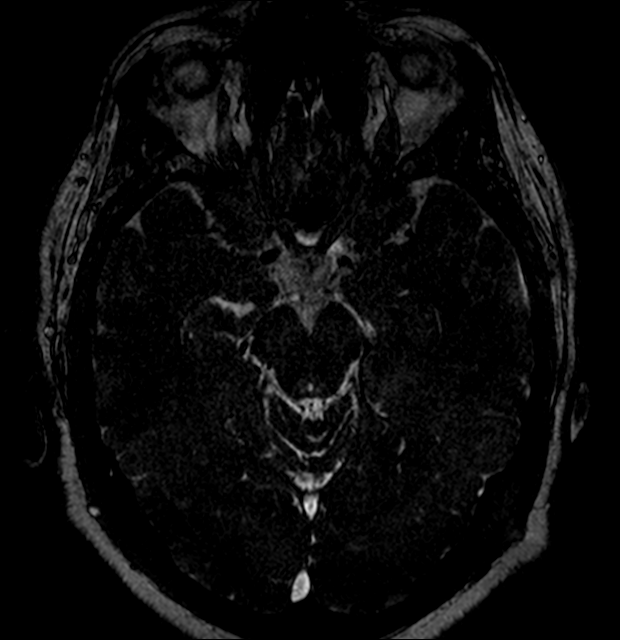

[Series 12: T1 post-contrast · coronal · 3.0mm · 0.35mm/px · 1 of 11 slices shown (1 of 2)]
[im 1/11]
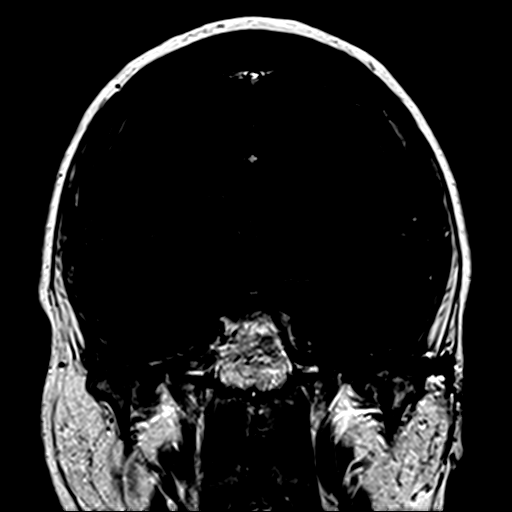

[Series 13: T1 post-contrast · axial · 3.0mm · 0.35mm/px · 1 of 11 slices shown (2 of 2)]
[im 1/11]
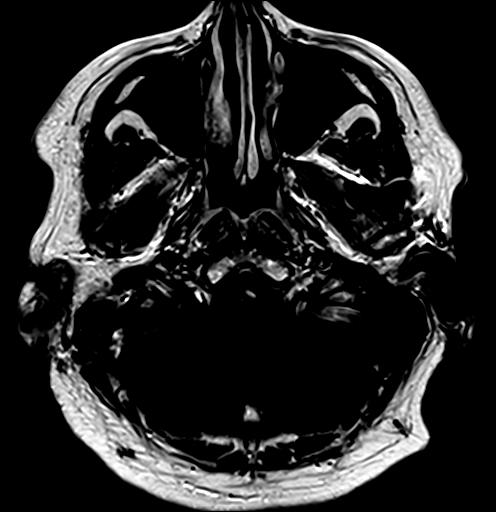

[Series 14: post_t1_mpr_tra · axial · 2.0mm · 0.45mm/px · 1 of 80 slices shown]
[im 1/80]
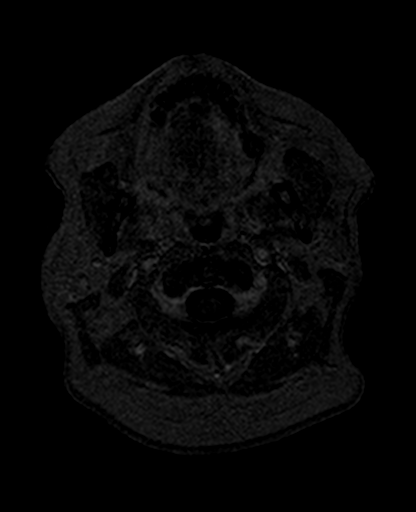

[39 of 48 positions shown; findings below may reference images not displayed]

FINDINGS: Calvarium and upper cervical spine: No focal marrow signal
abnormality.

Orbits: No significant findings.

Sinuses and Mastoids: Clear. Mastoid and middle ears are clear.

Brain: Symmetric and normal labyrinthine signal. No abnormal
labyrinthine or vestibulocochlear nerve enhancement. Unremarkable
brainstem. Symmetric and normal appearance of the sigmoid dural
venous sinuses without evidence of diverticulum.

No acute or remote infarct, hemorrhage, hydrocephalus, or mass
lesion. No evidence of large vessel occlusion.
IMPRESSION: Negative brain MRI.  No explanation for patient's symptoms.

## 2016-03-04 DIAGNOSIS — M2241 Chondromalacia patellae, right knee: Secondary | ICD-10-CM | POA: Diagnosis not present

## 2016-03-08 ENCOUNTER — Ambulatory Visit: Payer: Medicare Other | Admitting: Podiatry

## 2016-03-08 ENCOUNTER — Other Ambulatory Visit: Payer: Self-pay | Admitting: Family Medicine

## 2016-03-08 DIAGNOSIS — J328 Other chronic sinusitis: Secondary | ICD-10-CM

## 2016-03-09 ENCOUNTER — Other Ambulatory Visit: Payer: Self-pay | Admitting: Family Medicine

## 2016-03-09 DIAGNOSIS — J328 Other chronic sinusitis: Secondary | ICD-10-CM

## 2016-03-09 MED ORDER — FLUTICASONE PROPIONATE 50 MCG/ACT NA SUSP: 2.0000 | Freq: Every day | NASAL | 2 refills | Status: DC

## 2016-03-10 DIAGNOSIS — M533 Sacrococcygeal disorders, not elsewhere classified: Secondary | ICD-10-CM | POA: Diagnosis not present

## 2016-03-10 DIAGNOSIS — G894 Chronic pain syndrome: Secondary | ICD-10-CM | POA: Diagnosis not present

## 2016-03-10 DIAGNOSIS — Z79899 Other long term (current) drug therapy: Secondary | ICD-10-CM | POA: Diagnosis not present

## 2016-03-10 DIAGNOSIS — M542 Cervicalgia: Secondary | ICD-10-CM | POA: Diagnosis not present

## 2016-03-10 DIAGNOSIS — M5030 Other cervical disc degeneration, unspecified cervical region: Secondary | ICD-10-CM | POA: Diagnosis not present

## 2016-03-10 DIAGNOSIS — Z79891 Long term (current) use of opiate analgesic: Secondary | ICD-10-CM | POA: Diagnosis not present

## 2016-03-10 DIAGNOSIS — M549 Dorsalgia, unspecified: Secondary | ICD-10-CM | POA: Diagnosis not present

## 2016-03-10 NOTE — Progress Notes (Signed)
Diana:  Patient referred by Dr. Dennard Schaumann, seen by me on 01/12/16, split study on 03/02/16. Please call and notify patient that the recent sleep study confirmed the diagnosis of severe OSA. She did very well with CPAP during the study with significant improvement of the respiratory events. Therefore, I would like start the patient on CPAP therapy at home by prescribing a machine for home use. I placed the order in the chart. The patient will need a follow up appointment with me in 8 to 10 weeks post set up that has to be scheduled; please go ahead and schedule while you have the patient on the phone and make sure patient understands the importance of keeping this window for the FU appointment, as it is often an insurance requirement and failing to adhere to this may result in losing coverage for sleep apnea treatment.  Please re-enforce the importance of compliance with treatment and the need for [place] to monitor compliance data - again an insurance requirement and good feedback for the patient as far as how they are doing.  Also remind patient, that any upcoming CPAP machine or mask issues, should be first addressed with the DME company. Please ask if patient has a preference regarding DME company.  Please arrange for CPAP set up at home through a DME company of patient's choice - once you have spoken to the patient - and faxed/routed report to PCP and referring MD (if other than PCP), you can close this encounter, thanks,   Star Age, MD, PhD [facility] ([place])

## 2016-03-10 NOTE — Procedures (Signed)
PATIENT'S NAME:  [name] [name], [name] [name] DOB:      1965/11/07      MR#:    [id number]     DATE OF RECORDING: 03/01/2016 REFERRING M.D.:  Jenna Luo, MD Study Performed:  Split-Night Titration Study HISTORY:  50 year old woman with a history of bipolar disorder, hypertension, chronic pain and on chronic narcotic pain medications, reflux disease, seizure history, hypertension, anxiety, asthma, and obesity, who was previously diagnosed with obstructive sleep apnea. She was on CPAP therapy in the past but has not used it in years. The patient endorsed the Epworth Sleepiness Scale at 17 points, Fatigue score = 30. The patient's weight 227 pounds with a height of 64 (inches), resulting in a BMI of 39.7 kg/m2. The patient's neck circumference measured 15.5 inches.  CURRENT MEDICATIONS: Amatiza, Cogentin, Calcium, Celexa. Cleocin, Lotrisone, Flexeril, Flonase, Neurontin, Haldol, Hydrodiuril, Norco, Vistaril, Klor-Con, MS Contin, Multivitamins, Nitrostat, Percocet, Protonix, Minipress, ProAir HFA, Phenergan, Imitrex, Desyrel, Voltaren gel.    PROCEDURE:  This is a multichannel digital polysomnogram utilizing the Somnostar 11.2 system.  Electrodes and sensors were applied and monitored per AASM Specifications.   EEG, EOG, Chin and Limb EMG, were sampled at 200 Hz.  ECG, Snore and Nasal Pressure, Thermal Airflow, Respiratory Effort, CPAP Flow and Pressure, Oximetry was sampled at 50 Hz. Digital video and audio were recorded.      BASELINE STUDY WITHOUT CPAP RESULTS:  Lights Out was at 22:14 and Lights On at 05:15 for the night.  Total recording time (TRT) was 140.5, with a total sleep time (TST) of 122.5 minutes.   The patient's sleep latency was 10 minutes.   The sleep efficiency was 87.2 %.    SLEEP ARCHITECTURE: WASO (Wake after sleep onset) was 5 minutes, Stage N1 was 1.5 minutes, Stage N2 was 77.5 minutes, Stage N3 was 43.5 minutes and Stage R (REM sleep) was 0 minutes.  The percentages were Stage N1 1.2%,  Stage N2 63.3%, Stage N3 35.5% and Stage R (REM sleep) was absent.   Audio and video analysis did not show any abnormal or unusual movements, behaviors, phonations or vocalizations. Mild snoring was noted. The EKG was in keeping with normal sinus rhythm (NSR)  RESPIRATORY ANALYSIS:  There were a total of 94 respiratory events:  70 obstructive apneas, 0 central apneas and 3 mixed apneas with a total of 73 apneas and an apnea index (AI) of 35.8. There were 21 hypopneas with a hypopnea index of 10.3. The patient also had 0 respiratory event related arousals (RERAs).  Snoring was noted.     The total APNEA/HYPOPNEA INDEX (AHI) was 46. /hour and the total RESPIRATORY DISTURBANCE INDEX was 46. /hour.  0 events occurred in REM sleep and 45 events in NREM. The REM AHI was 0, /hour versus a non-REM AHI of 46. /hour. The patient spent 382.5 minutes sleep time in the supine position 0 minutes in non-supine. The supine AHI was 46.1 /hour versus a non-supine AHI of 0.0 /hour.  OXYGEN SATURATION & C02:  The wake baseline 02 saturation was 94%, with the lowest being 84%. Time spent below 89% saturation equaled 2 minutes.   PERIODIC LIMB MOVEMENTS:    The patient had a total of 9 Periodic Limb Movements.  The Periodic Limb Movement (PLM) index was 4.4 /hour and the PLM Arousal index was 0 /hour.   TITRATION STUDY WITH CPAP RESULTS:   CPAP was initiated at 5 cmH20 with heated humidity per AASM split night standards and pressure was advanced to 13  cmH20 because of hypopneas, apneas and desaturations.  At a PAP pressure of 12 cmH20, there was a reduction of the AHI to 2.8/hour. Supine REM sleep was achieved on a pressure of 11 cm, with an AHI of 6.9/hour. O2 nadir on 12 cm was 91%. A small simplus FFM was utilized for the study.  Total recording time (TRT) was 280.5 minutes, with a total sleep time (TST) of 260 minutes. The patient's sleep latency was 10.5 minutes. REM latency was 44 minutes.  The sleep efficiency  was 92.7 %.    SLEEP ARCHITECTURE: Wake after sleep was 16.5 minutes, Stage N1 1 minutes, Stage N2 126.5 minutes, Stage N3 71 minutes and Stage R (REM sleep) 61.5 minutes. The percentages were: Stage N1 .4%, Stage N2 48.7%, Stage N3 27.3% and Stage R (REM sleep) 23.7%.   RESPIRATORY ANALYSIS:  There were a total of 76 respiratory events: 49 obstructive apneas, 0 central apneas and 2 mixed apneas with a total of 51 apneas and an apnea index (AI) of 11.8. There were 25 hypopneas with a hypopnea index of 5.8 /hour. The patient also had 0 respiratory event related arousals (RERAs).      The total APNEA/HYPOPNEA INDEX  (AHI) was 17.5 /hour and the total RESPIRATORY DISTURBANCE INDEX was 17.5 /hour.  22 events occurred in REM sleep and 54 events in NREM. The REM AHI was 21.5 /hour versus a non-REM AHI of 16.3 /hour. REM sleep was achieved on a pressure of  cm/h2o (AHI was  .) The patient spent 100% of total sleep time in the supine position. The supine AHI was 17.6 /hour, versus a non-supine AHI of 0.0/hour.  OXYGEN SATURATION & C02:  The wake baseline 02 saturation was 96%, with the lowest being 86%. Time spent below 89% saturation equaled 3 minutes.  PERIODIC LIMB MOVEMENTS:    The patient had a total of 162 Periodic Limb Movements. The Periodic Limb Movement (PLM) index was 37.4 /hour and the PLM Arousal index was 1.4 /hour.   Post-study, the patient indicated that sleep was better than usual.  POLYSOMNOGRAPHY IMPRESSION :   1. Obstructive Sleep Apnea (OSA)  2. Periodic Limb Movement Disorder (PLMD)   RECOMMENDATIONS:  1. This patient has severe obstructive sleep apnea and responded well on CPAP therapy. I will, therefore, start the patient on home CPAP treatment at a pressure of 12 cm via small FFM, with heated humidity. The patient should be reminded to be fully compliant with PAP therapy to improve sleep related symptoms and decrease long term cardiovascular risks. Please note that untreated  obstructive sleep apnea carries additional perioperative morbidity. Patients with significant obstructive sleep apnea should receive perioperative PAP therapy and the surgeons and particularly the anesthesiologist should be informed of the diagnosis and the severity of the sleep disordered breathing. 2. Moderate PLMs (periodic limb movements of sleep) were noted during the treatment portion of the study only and without significant arousals; clinical correlation is recommended. Medication effect from the patient's antidepressant medication should be considered.  3. The patient should be cautioned not to drive, work at heights, or operate dangerous or heavy equipment when tired or sleepy. Review and reiteration of good sleep hygiene measures should be pursued with any patient. 4. The patient will be seen in follow-up by Dr. Rexene Alberts at [facility] for discussion of the test results and further management strategies. The referring provider will be notified of the test results.       I certify that I have reviewed the  entire raw data recording prior to the issuance of this report in accordance with the Standards of Accreditation of the American Academy of Sleep Medicine (AASM)       Star Age, MD, PhD Diplomat, American Board of Psychiatry and Neurology  Diplomat, American Board of Sleep Medicine

## 2016-03-10 NOTE — Addendum Note (Signed)
Addended by: Star Age on: 03/10/2016 08:17 PM   Modules accepted: Orders

## 2016-03-15 ENCOUNTER — Telehealth: Payer: Self-pay

## 2016-03-15 NOTE — Telephone Encounter (Signed)
-----   Message from Star Age, MD sent at 03/10/2016  8:17 PM EST ----- Beverlee Nims:  Patient referred by Dr. Dennard Schaumann, seen by me on 01/12/16, split study on 03/02/16. Please call and notify patient that the recent sleep study confirmed the diagnosis of severe OSA. She did very well with CPAP during the study with significant improvement of the respiratory events. Therefore, I would like start the patient on CPAP therapy at home by prescribing a machine for home use. I placed the order in the chart. The patient will need a follow up appointment with me in 8 to 10 weeks post set up that has to be scheduled; please go ahead and schedule while you have the patient on the phone and make sure patient understands the importance of keeping this window for the FU appointment, as it is often an insurance requirement and failing to adhere to this may result in losing coverage for sleep apnea treatment.  Please re-enforce the importance of compliance with treatment and the need for [place] to monitor compliance data - again an insurance requirement and good feedback for the patient as far as how they are doing.  Also remind patient, that any upcoming CPAP machine or mask issues, should be first addressed with the DME company. Please ask if patient has a preference regarding DME company.  Please arrange for CPAP set up at home through a DME company of patient's choice - once you have spoken to the patient - and faxed/routed report to PCP and referring MD (if other than PCP), you can close this encounter, thanks,   Star Age, MD, PhD [facility] ([place])

## 2016-03-15 NOTE — Telephone Encounter (Signed)
I spoke to patient and she is aware of results and recommendations. She is willing to restart treatment. I have sent orders to [facility] per patient request. I will send report to PCP. Patient will receive a letter reminding her to make f/u appt and stress the importance of compliance.

## 2016-03-19 ENCOUNTER — Ambulatory Visit: Payer: Medicare Other | Admitting: Podiatry

## 2016-03-22 ENCOUNTER — Ambulatory Visit: Payer: Medicare Other | Admitting: Podiatry

## 2016-03-25 ENCOUNTER — Ambulatory Visit (INDEPENDENT_AMBULATORY_CARE_PROVIDER_SITE_OTHER): Payer: Medicare Other | Admitting: Podiatry

## 2016-03-25 ENCOUNTER — Ambulatory Visit (INDEPENDENT_AMBULATORY_CARE_PROVIDER_SITE_OTHER): Payer: Medicare Other

## 2016-03-25 DIAGNOSIS — Z09 Encounter for follow-up examination after completed treatment for conditions other than malignant neoplasm: Secondary | ICD-10-CM

## 2016-03-25 DIAGNOSIS — M2031 Hallux varus (acquired), right foot: Secondary | ICD-10-CM | POA: Diagnosis not present

## 2016-03-25 NOTE — Progress Notes (Signed)
Subjective: [name] [name] is a 50 y.o. is seen today in office s/p right hallux IPJ fusion preformed on 12/31/15. She states "I am doing great".  She states she is having no problems and she has not had any reoccurrence of the ulcer and no new areas of concern present. Denies any systemic complaints such as fevers, chills, nausea, vomiting. No calf pain, chest pain, shortness of breath.   Objective: General: No acute distress, AAOx3  DP/PT pulses palpable 2/4, CRT < 3 sec to all digits.  Motor function intact.  Right foot: Incision is well coapted without any evidence of dehiscence and a scar is formed. There is mild continued edema however does appear to be improved. There is no pain to the toe and the toes in a rectus position. There is no signficant  erythema there is no ascending cellulitis. There is no fluctuance or crepitus. There is no open sores identified. There is no evidence of skin breakdown. No pain with calf compression, swelling, warmth, erythema.   Assessment and Plan:  Status post right foot IPJ fusion, with improved cellulitis   -Treatment options discussed including all alternatives, risks, and complications -X-rays were obtained and reviewed with the patient. 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  -At this time there is no wound and she is doing well. I dispensed a toe separator sooner hallux and second toe as a preventative measure in case of swelling. I do not want the toes to Carmine. She can continue regular shoe as tolerated. Monitoring skin breakdown. At this point she is doing well from the surgery and did not have any pain or open sores. Discussed that there is any palms to call the office immediately she understood this and she agrees to this plan. I'll see her back and the next several months for routine check given her neuropathy and history of ulceration.   Celesta Gentile, DPM

## 2016-03-28 ENCOUNTER — Encounter (HOSPITAL_COMMUNITY): Payer: Self-pay | Admitting: Emergency Medicine

## 2016-03-28 ENCOUNTER — Ambulatory Visit (HOSPITAL_COMMUNITY)
Admission: EM | Admit: 2016-03-28 | Discharge: 2016-03-28 | Disposition: A | Discharge status: Home / Self Care | Payer: Medicare Other | Attending: Family Medicine | Admitting: Family Medicine

## 2016-03-28 DIAGNOSIS — K2900 Acute gastritis without bleeding: Secondary | ICD-10-CM | POA: Diagnosis not present

## 2016-03-28 DIAGNOSIS — N3001 Acute cystitis with hematuria: Secondary | ICD-10-CM

## 2016-03-28 LAB — POCT URINALYSIS DIP (DEVICE)
Bilirubin Urine: NEGATIVE
Glucose, UA: NEGATIVE mg/dL
Ketones, ur: NEGATIVE mg/dL
Nitrite: NEGATIVE
Protein, ur: NEGATIVE mg/dL
Specific Gravity, Urine: 1.02 (ref 1.005–1.030)
Urobilinogen, UA: 0.2 mg/dL (ref 0.0–1.0)
pH: 7 (ref 5.0–8.0)

## 2016-03-28 MED ORDER — NITROFURANTOIN MACROCRYSTAL 100 MG PO CAPS: 100.0000 mg | ORAL_CAPSULE | Freq: Two times a day (BID) | ORAL | 0 refills | Status: DC

## 2016-03-28 NOTE — ED Triage Notes (Signed)
The patient presented to the [facility] with multiple complaints.  The patient reported that she has had urinary urgency and frequency along with dysuria x 3 days.   The patient also reported N/V but no abdominal pain for 3 days. The patient stated that Friday she vomited a "black tar looking substance."

## 2016-03-28 NOTE — ED Provider Notes (Signed)
[place]    CSN: [id number] Arrival date & time: 03/28/16  1730     History   Chief Complaint Chief Complaint  Patient presents with  . Dysuria  . Nausea    HPI [name] [name] is a 50 y.o. female.   The history is provided by the patient.  Dysuria  Pain quality:  Burning Pain severity:  Mild Onset quality:  Gradual Duration:  3 days Progression:  Worsening Chronicity:  Chronic Recent urinary tract infections: no   Relieved by:  None tried Worsened by:  Nothing Ineffective treatments:  None tried Urinary symptoms: frequent urination and hematuria   Associated symptoms: nausea and vomiting   Associated symptoms: no fever, no flank pain and no vaginal discharge   Risk factors: recurrent urinary tract infections     Past Medical History:  Diagnosis Date  . Anxiety   . Asthma   . Bipolar 1 disorder ([place])    ect treatments last treatment Sep 02 1011  . Depression   . GERD (gastroesophageal reflux disease)   . Hypertension   . Seizures ([place])    last seizure was so long ago she can't remember.   . Sleep apnea     Patient Active Problem List   Diagnosis Date Noted  . 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 Chronic pain syndrome   . Esophageal reflux   . Depression   . Rash 11/14/2015  . Cellulitis of great toe of right foot 08/12/2015  . Toe ulcer, right ([place]) 04/11/2015  . Cellulitis of toe of right foot 04/11/2015  . Nonspecific chest pain   . Chest pain 10/26/2014  . Morbid obesity due to excess calories ([place]) 10/26/2014  . Hypokalemia 10/26/2014  . Manic bipolar I disorder ([place]) 10/26/2014  . Atypical angina ([place]) 10/26/2014  . Manic bipolar I disorder with rapid cycling ([place]) 05/18/2011    Class: Acute    Past Surgical History:  Procedure Laterality Date  . ABDOMINAL HYSTERECTOMY    . BACK SURGERY    . CARPAL TUNNEL RELEASE     x2  . Laproscopic knee surgery    . PLANTAR FASCIA RELEASE     x2    OB History    No data available         Home Medications    Prior to Admission medications   Medication Sig Start Date End Date Taking? Authorizing Provider  AMITIZA 24 MCG capsule TAKE 1 CAPSULE TWICE A DAY WITH A MEAL 10/07/15  Yes [name], MD  benztropine (COGENTIN) 1 MG tablet Take 1 mg by mouth 2 (two) times daily.    Yes Historical Provider, MD  Calcium Carbonate-Vit D-Min (CALCIUM 1200 PO) Take 1 tablet by mouth daily.   Yes Historical Provider, MD  citalopram (CELEXA) 40 MG tablet Take 40 mg by mouth at bedtime.  05/28/11  Yes Darrol Jump, MD  cyclobenzaprine (FLEXERIL) 10 MG tablet Take 10 mg by mouth 3 (three) times daily as needed for muscle spasms.   Yes Historical Provider, MD  fluticasone (FLONASE) 50 MCG/ACT nasal spray Place 2 sprays into both nostrils daily. 03/09/16  Yes [name], MD  gabapentin (NEURONTIN) 600 MG tablet Take 1 tablet (600 mg total) by mouth 3 (three) times daily. For anxiety and pain management. May have to stop to take ECT. 05/28/11  Yes Darrol Jump, MD  haloperidol (HALDOL) 5 MG tablet Take 1 tablet (5 mg total) by mouth at bedtime. For psychosis 11/15/15  Yes Barton Dubois, MD  hydrochlorothiazide (HYDRODIURIL) 25 MG tablet TAKE 1 TABLET EVERY DAY 09/25/15  Yes [name], MD  HYDROcodone-acetaminophen [name]) 10-325 MG per tablet Take 1 tablet by mouth every 8 (eight) hours.    Yes Historical Provider, MD  hydrOXYzine (ATARAX/VISTARIL) 10 MG tablet Take 10 mg by mouth 3 (three) times daily.  11/25/14  Yes Historical Provider, MD  morphine (MS CONTIN) 30 MG 12 hr tablet  01/07/16  Yes Historical Provider, MD  Multiple Vitamins-Minerals (MULTIVITAMIN WITH MINERALS) tablet Take 1 tablet by mouth daily.   Yes Historical Provider, MD  oxyCODONE-acetaminophen (PERCOCET/ROXICET) 5-325 MG tablet Take 1 tablet by mouth every 4 (four) hours as needed for severe pain.   Yes [name], DPM  pantoprazole (PROTONIX) 40 MG tablet TAKE 1 TABLET BY MOUTH TWICE A DAY 02/18/16  Yes  [name], MD  potassium chloride SA (KLOR-CON M20) 20 MEQ tablet Take 2 tablets (40 mEq total) by mouth daily. 02/04/16  Yes [name], MD  prazosin (MINIPRESS) 5 MG capsule Take 5 mg by mouth at bedtime.   Yes Historical Provider, MD  PROAIR HFA 108 (90 BASE) MCG/ACT inhaler INHALE 2 PUFFS INTO THE LUNGS EVERY 4 HOURS AS NEEDED FOR WHEEZING 10/01/14  Yes [name], MD  promethazine (PHENERGAN) 12.5 MG tablet TAKE 1 TABLET BY MOUTH EVERY 8 HOURS AS NEEDED FOR NAUSEA AND VOMITING 02/26/16  Yes [name], DPM  promethazine (PHENERGAN) 12.5 MG tablet Take 1 tablet (12.5 mg total) by mouth every 8 (eight) hours as needed for nausea or vomiting. 02/25/16  Yes [name], DPM  SUMAtriptan (IMITREX) 100 MG tablet TAKE 1 TABLET (100 MG TOTAL) BY MOUTH EVERY 2 (TWO) HOURS AS NEEDED. MIGRANES 05/27/14  Yes [name], MD  traZODone (DESYREL) 100 MG tablet Take 100 mg by mouth at bedtime. 07/29/14  Yes Historical Provider, MD  VOLTAREN 1 % GEL Apply 2 g topically daily as needed (for pain).  06/29/13  Yes Historical Provider, MD  clindamycin (CLEOCIN) 300 MG capsule Take 1 capsule (300 mg total) by mouth 3 (three) times daily. 01/09/16   [name], DPM  clindamycin (CLEOCIN) 300 MG capsule Take 1 capsule (300 mg total) by mouth 3 (three) times daily. 02/13/16   [name], DPM  clotrimazole-betamethasone (LOTRISONE) cream Apply 1 application topically 2 (two) times daily. X 2 weeks 12/25/15   [name], MD  nitrofurantoin (MACRODANTIN) 100 MG capsule Take 1 capsule (100 mg total) by mouth 2 (two) times daily. 03/28/16   Billy Fischer, MD  nitroGLYCERIN (NITROSTAT) 0.4 MG SL tablet Place 1 tablet (0.4 mg total) under the tongue every 5 (five) minutes as needed for chest pain. Patient not taking: Reported on 03/28/2016 10/27/14   Leone Brand, MD  sulfamethoxazole-trimethoprim (BACTRIM DS,SEPTRA DS) 800-160 MG tablet Take 1 tablet by mouth 2 (two) times  daily. 01/12/16   Orlena Sheldon, PA-C    Family History Family History  Problem Relation Age of Onset  . Diabetes Mother   . COPD Mother   . Hypertension Mother   . Hyperlipidemia Mother   . Heart disease Father   . Hyperlipidemia Father   . Hypertension Father   . Cancer Father   . Heart disease Brother   . Heart disease Daughter     Social History Social History  Substance Use Topics  . Smoking status: Never Smoker  . Smokeless tobacco: Never Used  .  Alcohol use No     Allergies   Tetracyclines & related; Tramadol; Ciprofloxacin; Codeine; and Penicillins   Review of Systems Review of Systems  Constitutional: Negative.  Negative for fever.  Respiratory: Negative.   Cardiovascular: Negative.   Gastrointestinal: Positive for nausea and vomiting.       Vomited some black/ tarry subt once  Last week.  Genitourinary: Positive for dysuria, frequency and urgency. Negative for flank pain and vaginal discharge.  All other systems reviewed and are negative.    Physical Exam Triage Vital Signs ED Triage Vitals  Enc Vitals Group     BP 03/28/16 1805 124/78     Pulse Rate 03/28/16 1805 91     Resp 03/28/16 1805 18     Temp 03/28/16 1805 98.4 F (36.9 C)     Temp Source 03/28/16 1805 Oral     SpO2 03/28/16 1805 99 %     Weight --      Height --      Head Circumference --      Peak Flow --      Pain Score 03/28/16 1811 0     Pain Loc --      Pain Edu? --      Excl. in [place]? --    No data found.   Updated Vital Signs BP 124/78 (BP Location: Left Arm)   Pulse 91   Temp 98.4 F (36.9 C) (Oral)   Resp 18   SpO2 99%   Visual Acuity Right Eye Distance:   Left Eye Distance:   Bilateral Distance:    Right Eye Near:   Left Eye Near:    Bilateral Near:     Physical Exam  Constitutional: She is oriented to person, place, and time. She appears well-developed and well-nourished. No distress.  Cardiovascular: Normal rate, regular rhythm, normal heart sounds and  intact distal pulses.   Pulmonary/Chest: Effort normal and breath sounds normal.  Abdominal: Soft. Bowel sounds are normal. There is no tenderness.  Neurological: She is alert and oriented to person, place, and time.  Skin: Skin is warm.  Nursing note and vitals reviewed.    UC Treatments / Results  Labs (all labs ordered are listed, but only abnormal results are displayed) Labs Reviewed  POCT URINALYSIS DIP (DEVICE) - Abnormal; Notable for the following:       Result Value   Hgb urine dipstick SMALL (*)    Leukocytes, UA LARGE (*)    All other components within normal limits    EKG  EKG Interpretation None       Radiology No results found.  Procedures Procedures (including critical care time)  Medications Ordered in UC Medications - No data to display   Initial Impression / Assessment and Plan / UC Course  I have reviewed the triage vital signs and the nursing notes.  Pertinent labs & imaging results that were available during my care of the patient were reviewed by me and considered in my medical decision making (see chart for details).  Clinical Course       Final Clinical Impressions(s) / UC Diagnoses   Final diagnoses:  Acute cystitis with hematuria  Acute superficial gastritis without hemorrhage    New Prescriptions New Prescriptions   NITROFURANTOIN (MACRODANTIN) 100 MG CAPSULE    Take 1 capsule (100 mg total) by mouth 2 (two) times daily.     Billy Fischer, MD 03/28/16 [phone]

## 2016-03-28 NOTE — Discharge Instructions (Signed)
Take all of medicine as directed, drink lots of fluids, see your doctor if further problems. Call on mon to report on black vomitus.

## 2016-03-30 DIAGNOSIS — M961 Postlaminectomy syndrome, not elsewhere classified: Secondary | ICD-10-CM | POA: Diagnosis not present

## 2016-03-30 DIAGNOSIS — M48061 Spinal stenosis, lumbar region without neurogenic claudication: Secondary | ICD-10-CM | POA: Diagnosis not present

## 2016-03-30 DIAGNOSIS — G4733 Obstructive sleep apnea (adult) (pediatric): Secondary | ICD-10-CM | POA: Diagnosis not present

## 2016-04-05 ENCOUNTER — Other Ambulatory Visit: Payer: Self-pay | Admitting: Family Medicine

## 2016-04-05 DIAGNOSIS — J328 Other chronic sinusitis: Secondary | ICD-10-CM

## 2016-04-05 MED ORDER — FLUTICASONE PROPIONATE 50 MCG/ACT NA SUSP: 2.0000 | Freq: Every day | NASAL | 2 refills | Status: DC

## 2016-04-06 IMAGING — CT CT L SPINE W/O CM
2 of 16 series · 7 of 33 positions shown, 8 images · non-contrast
Comparison: Thoracic and lumbar CT myelogram 12/12/2013

CLINICAL DATA: Fall this year with increased thoracic and lumbar
spine pain, bilateral hip pain, and bilateral foot numbness.
Multiple prior spine surgeries.

EXAM:
CT THORACIC AND LUMBAR SPINE WITHOUT CONTRAST
TECHNIQUE: Multidetector CT imaging of the thoracic and lumbar spine was
performed without contrast. Multiplanar CT image reconstructions
were also generated.

[Series 9: sag t-sp · sagittal · 0.36mm/px · 5 of 35 slices shown]
[im 6/35  bone]
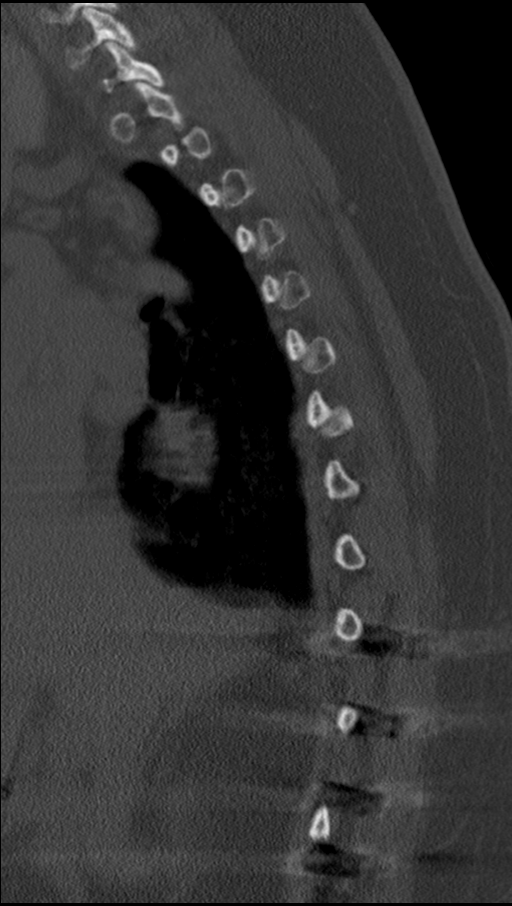
[im 12/35  bone]
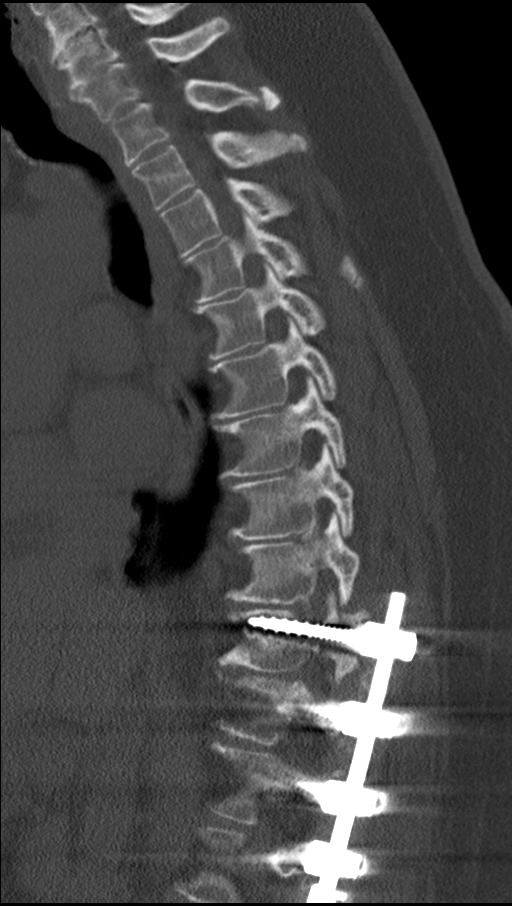
[im 18/35  bone]
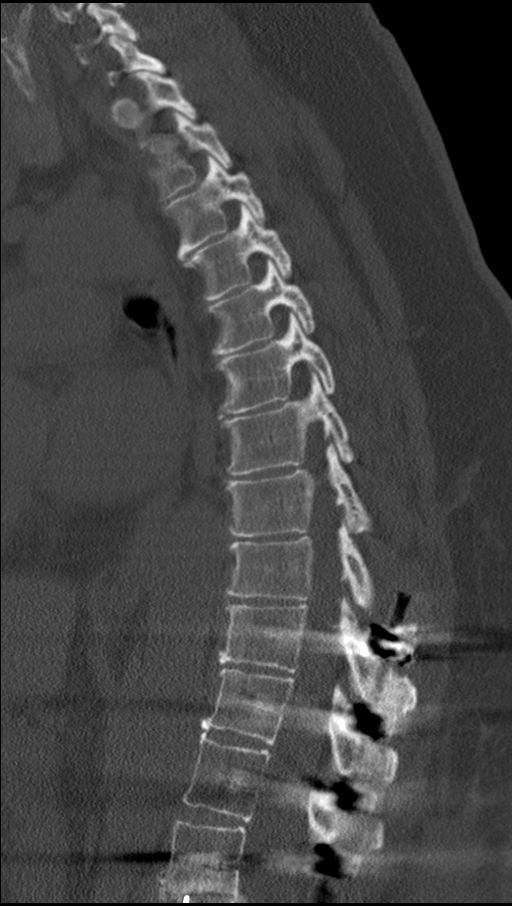
[im 23/35  bone]
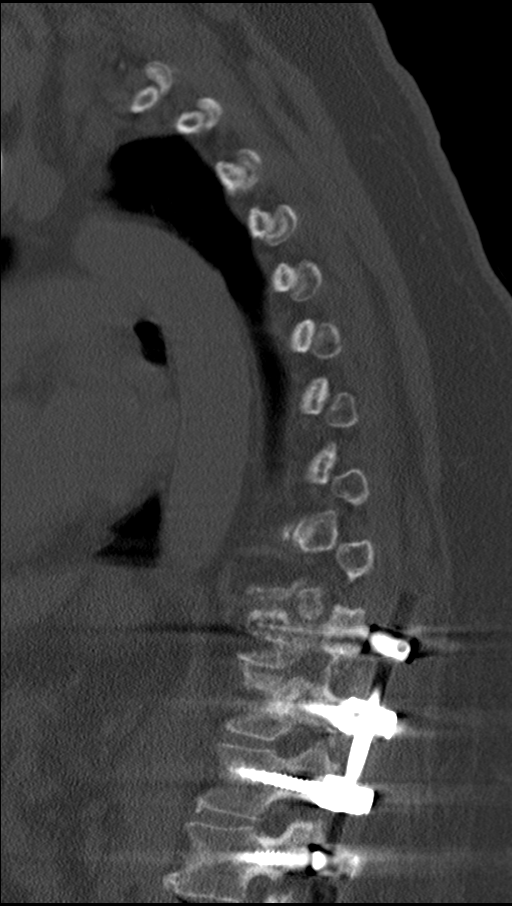
[im 29/35  bone]
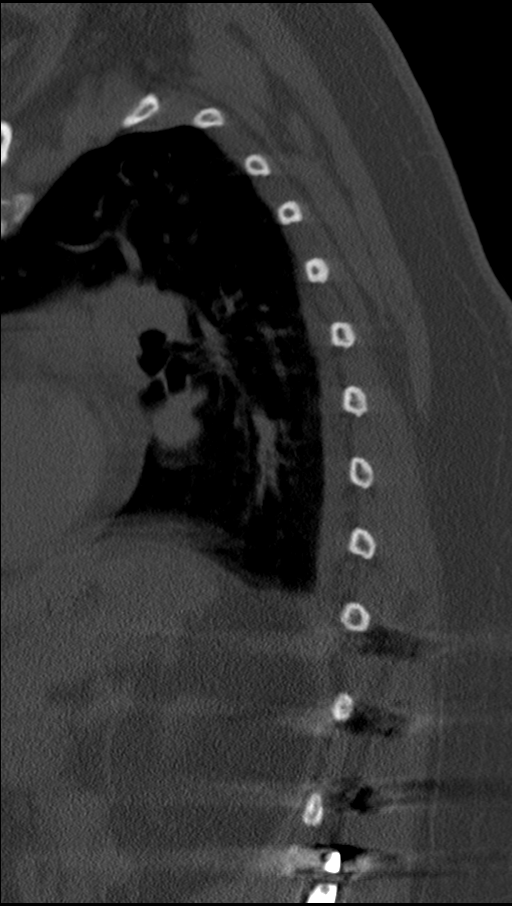

[Series 11: axial lower t-spine · axial · 0.33mm/px · z∈[-347,-152]mm · 2 of 66 slices shown, 3 images]
[im 1/66  soft-tissue]
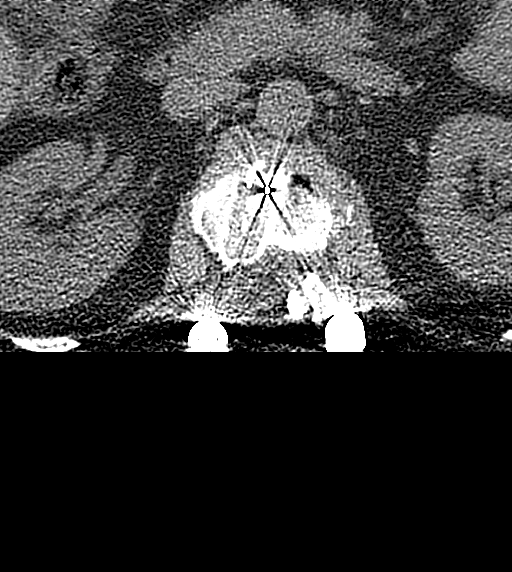
[im 1/66  bone]
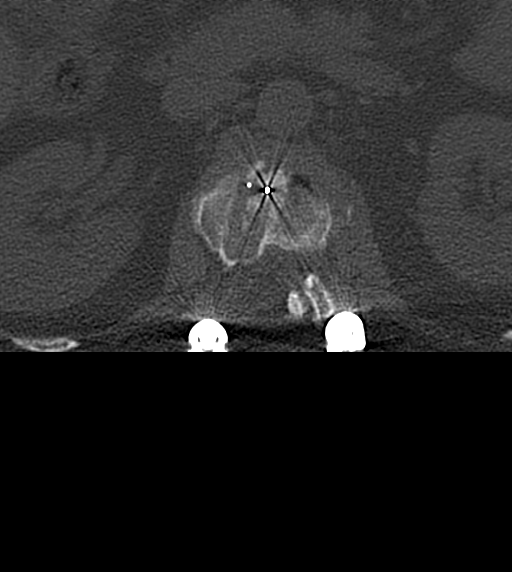
[im 66/66  bone]
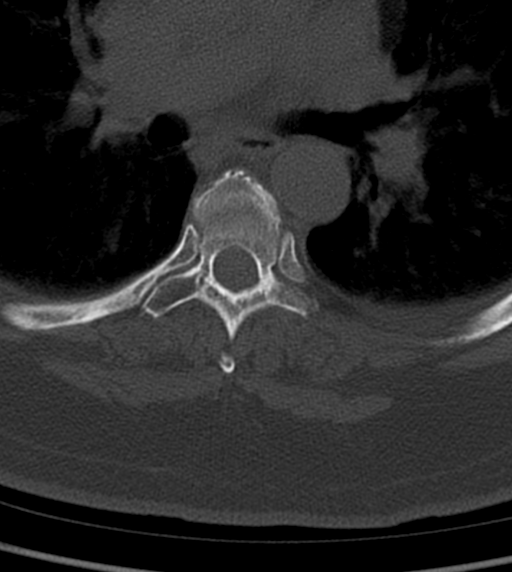

[7 of 33 positions shown; findings below may reference images not displayed]

FINDINGS: CT THORACIC SPINE FINDINGS

Thoracic vertebral alignment is within normal limits. Vertebral body
heights are preserved. Advanced disc degeneration is noted in the
visualized lower cervical spine at C5-6 and C6-7. Evaluation of the
thoracic spinal canal is limited by noncontrast CT technique as well
as metallic streak artifact in the lower thoracic spine. No sizable
disc herniation or stenosis is identified from T1-2 through T9-10.
There is a tiny calcified central disc protrusion at T6-7.

The patient's pre-existing posterior lumbar fusion has been extended
in the interim, now spanning from T10-L5 (previously L2-L5). There
is abnormal lucency about both T10 pedicle screws measuring up to
approximately 3 mm. The vertebral body cortex lateral to the tips of
the pedicle screws is fractured/ disrupted bilaterally at T10. T11
and T12 pedicle screws appear well-positioned without significant
evidence of loosening. There are trace bilateral pleural effusions,
similar to the prior study.

CT LUMBAR SPINE FINDINGS

There is slight retrolisthesis of L1 on L2, slightly less than on
the prior study. The pre-existing posterior lumbar fusion has been
extended cephalad as described above. New bilateral pedicle screws
at L1 appear well-positioned. There is an interbody spacer at L1-2
without solid intervertebral osseous fusion identified. A minimally
displaced posterior L2 superior endplate fracture is again noted and
demonstrates some interval healing. L2 pedicle screws have been
removed in the interim.

Sequelae of prior L2-L5 posterior and interbody fusion are again
identified with solid intervertebral osseous fusion at all 3 levels.
Solid posterolateral fusion masses are present on the right from
L2-S1 and on the left from L4-S1. Old bilateral pedicle screw tracks
are again noted at S1, and there is also partial osseous fusion
across the L5-S1 disc space. No new lumbar spine fracture is
identified. Mild atherosclerotic vascular calcification is noted.

L1-2: Interval posterior decompression and fusion with right
foraminotomy. Spinal canal appears widely patent on this noncontrast
CT. No evidence of significant neural foraminal stenosis.

L2-3: Prior posterior decompression and fusion. No evidence of
significant residual stenosis.

L3-4:  Prior left laminectomy and fusion.  No residual stenosis.

L4-5:  Prior right laminectomy and fusion.  No residual stenosis.

L5-S1: Prior posterior decompression and fusion. The left L5 screw
slightly breaches the inferior cortex of the pedicle and contributes
to minimal left neuroforaminal narrowing along with posterior
endplate osteophytes, unchanged. No spinal stenosis.
IMPRESSION: 1. Interval extension of posterior spinal fusion, now all T10-L5.
2. Lucency about both T10 pedicle screws consistent with loosening,
with lateral vertebral body cortical breakthrough/fracture adjacent
to both screws.
3. Decreased retrolisthesis of L1 on L2 following interval fusion.
Solid intervertebral osseous fusion not identified at this level.
4. Partial interval healing of L2 superior endplate fracture.

## 2016-04-08 ENCOUNTER — Ambulatory Visit: Payer: Self-pay | Admitting: Family Medicine

## 2016-04-08 DIAGNOSIS — Z79891 Long term (current) use of opiate analgesic: Secondary | ICD-10-CM | POA: Diagnosis not present

## 2016-04-08 DIAGNOSIS — M533 Sacrococcygeal disorders, not elsewhere classified: Secondary | ICD-10-CM | POA: Diagnosis not present

## 2016-04-08 DIAGNOSIS — M5030 Other cervical disc degeneration, unspecified cervical region: Secondary | ICD-10-CM | POA: Diagnosis not present

## 2016-04-08 DIAGNOSIS — G894 Chronic pain syndrome: Secondary | ICD-10-CM | POA: Diagnosis not present

## 2016-04-08 DIAGNOSIS — M549 Dorsalgia, unspecified: Secondary | ICD-10-CM | POA: Diagnosis not present

## 2016-04-08 DIAGNOSIS — Z79899 Other long term (current) drug therapy: Secondary | ICD-10-CM | POA: Diagnosis not present

## 2016-04-17 IMAGING — CT CT HEAD W/O CM
2 series · 16 of 30 positions shown, 18 images · non-contrast
Comparison: 12/03/2014 brain MRI

CLINICAL DATA: Left-sided headache for several days.

EXAM:
CT HEAD WITHOUT CONTRAST
TECHNIQUE: Contiguous axial images were obtained from the base of the skull
through the vertex without intravenous contrast.

[Series 201: head w/o, idose (1) · axial · non-contrast · 0.49mm/px · z∈[+76,+196]mm · 8 of 32 slices shown, 10 images]
[im 4/32  brain]
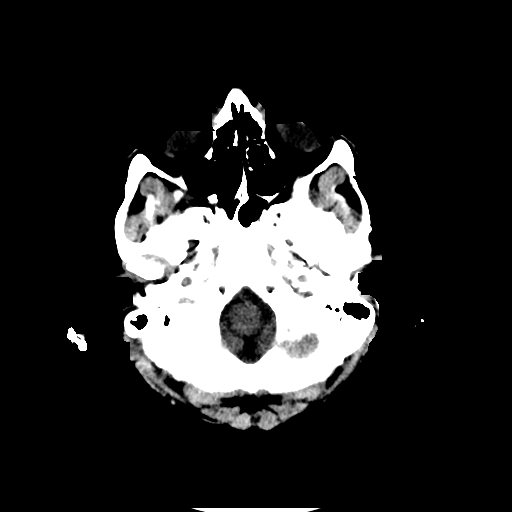
[im 4/32  bone]
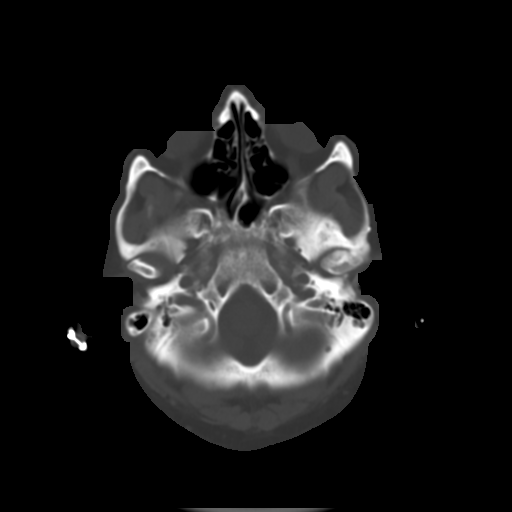
[im 7/32  brain]
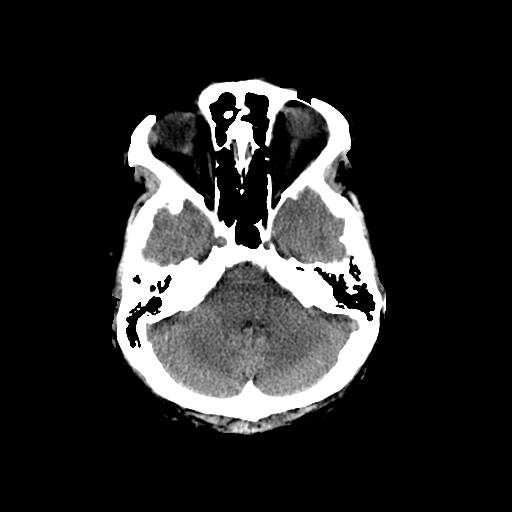
[im 11/32  brain]
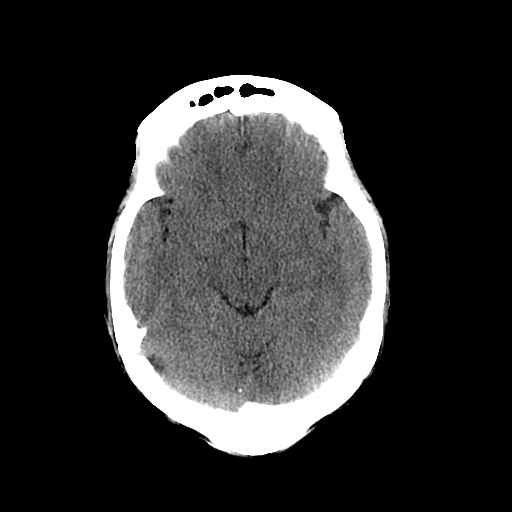
[im 14/32  brain]
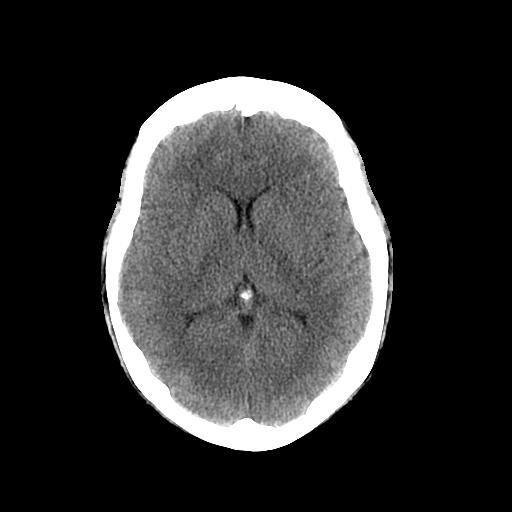
[im 18/32  brain]
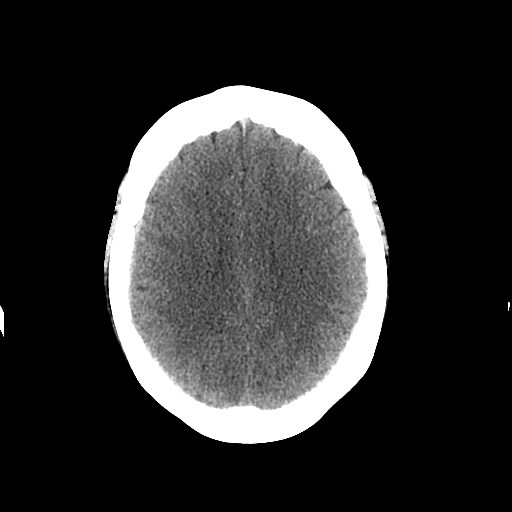
[im 18/32  bone]
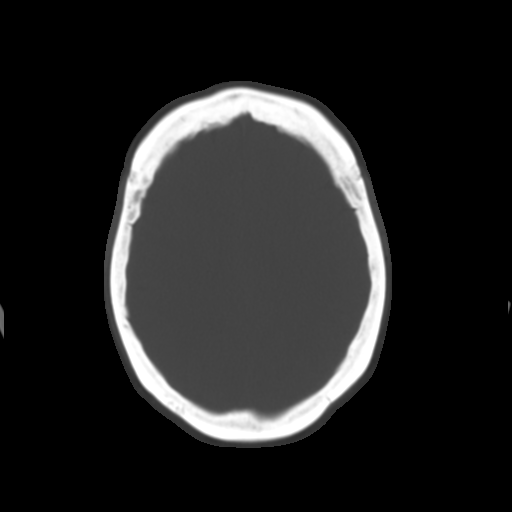
[im 21/32  brain]
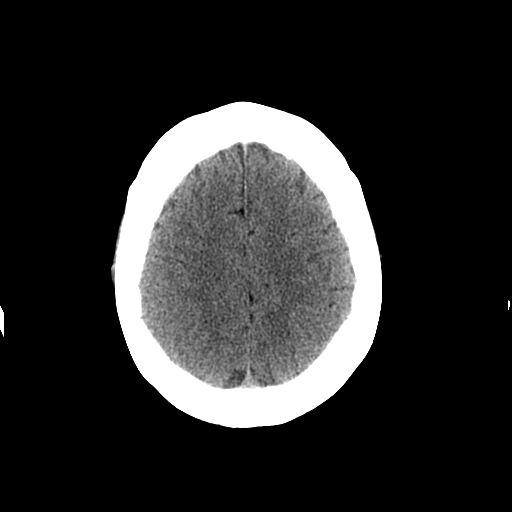
[im 25/32  brain]
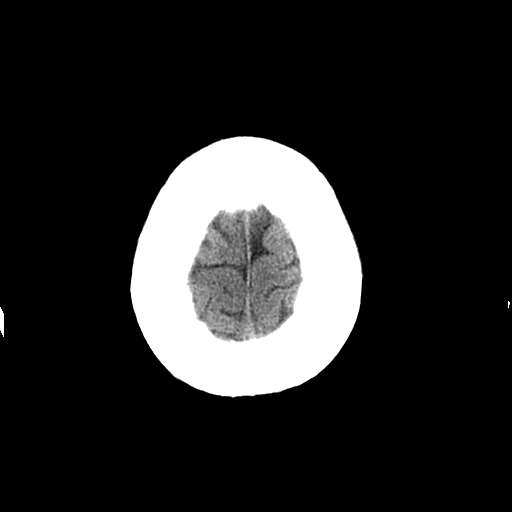
[im 28/32  brain]
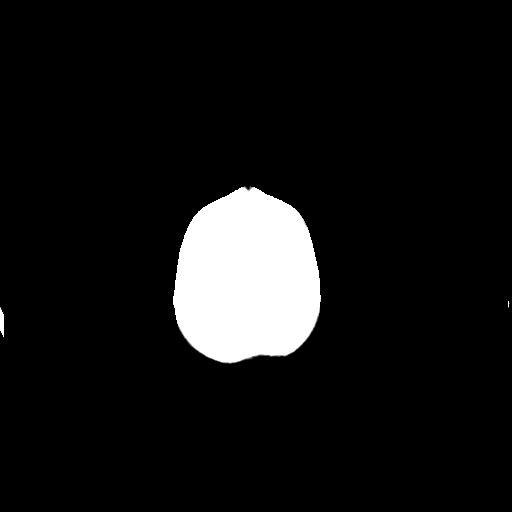

[Series 202: head w/o bone, idose (1) · axial · non-contrast · 0.49mm/px · z∈[+74,+199]mm · 8 of 64 slices shown]
[im 7/64  bone]
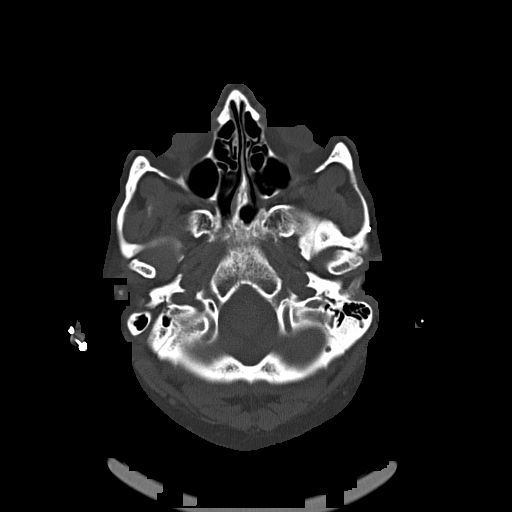
[im 14/64  bone]
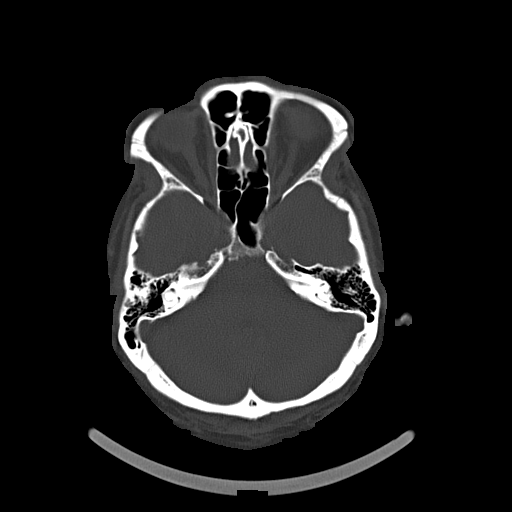
[im 20/64  bone]
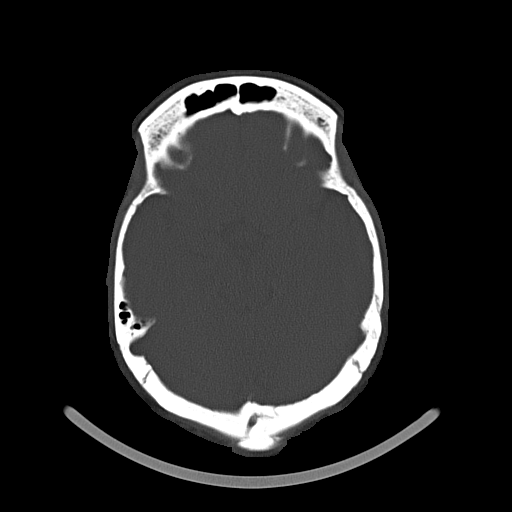
[im 27/64  bone]
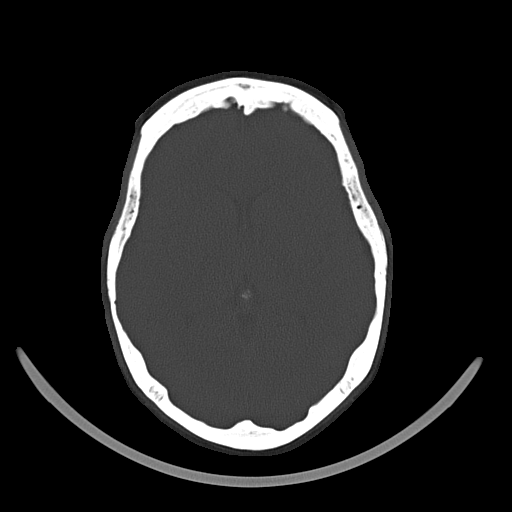
[im 37/64  bone]
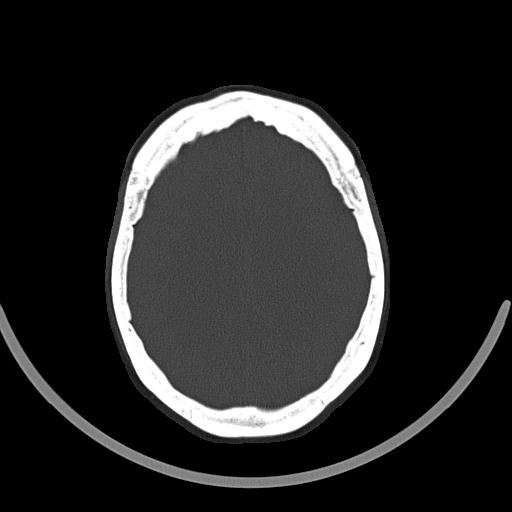
[im 44/64  bone]
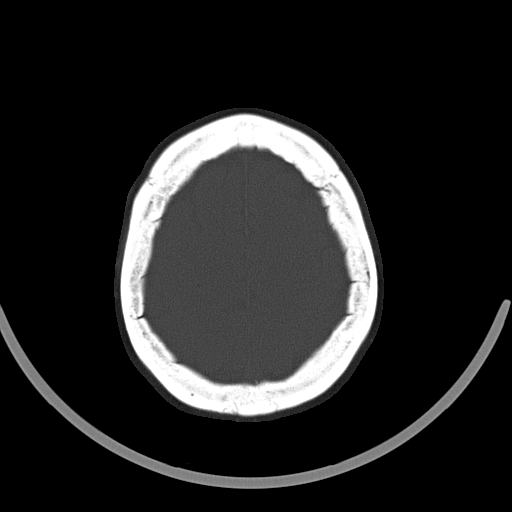
[im 50/64  bone]
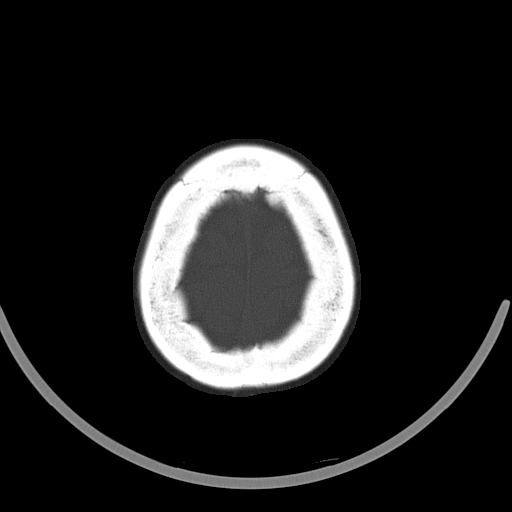
[im 57/64  bone]
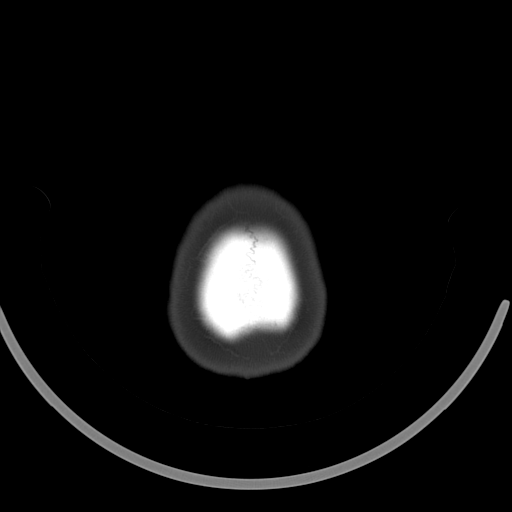

[16 of 30 positions shown; findings below may reference images not displayed]

FINDINGS: Skull and Sinuses:Negative for fracture or destructive process. The
mastoids, middle ears, and imaged paranasal sinuses are clear.

Orbits: No acute abnormality.

Brain: Negative. No evidence of acute infarction, hemorrhage,
hydrocephalus, or mass lesion/mass effect.
IMPRESSION: Negative head CT.

## 2016-04-19 HISTORY — PX: BACK SURGERY: SHX140

## 2016-04-28 ENCOUNTER — Ambulatory Visit (HOSPITAL_COMMUNITY)
Admission: EM | Admit: 2016-04-28 | Discharge: 2016-04-28 | Disposition: A | Discharge status: Home / Self Care | Payer: Medicare Other

## 2016-04-30 ENCOUNTER — Telehealth: Payer: Self-pay | Admitting: Family Medicine

## 2016-04-30 NOTE — Telephone Encounter (Signed)
Pt was seen by [facility] for hand injury.  Calling for MCD NPI #,  # given with 6 visits/6 months.

## 2016-05-06 IMAGING — CT CT ABD-PELV W/ CM
2 of 5 series · 16 of 46 positions shown, 18 images · IV contrast (APPLIED)
Comparison: Abdominal pelvic CT 10/07/2010. Thoracic spine CT
01/07/2015.

CLINICAL DATA: Mid to left abdominal pain with nausea for 3 months.
History of appendectomy and partial hysterectomy. Initial encounter.

EXAM:
CT ABDOMEN AND PELVIS WITH CONTRAST
TECHNIQUE: Multidetector CT imaging of the abdomen and pelvis was performed
using the standard protocol following bolus administration of
intravenous contrast.
CONTRAST:  125 ml [id number].

[Series 2: abd/pelvis w/cm · axial · 0.91mm/px · z∈[-471,-41]mm · 13 of 98 slices shown, 15 images]
[im 6/98  soft-tissue]
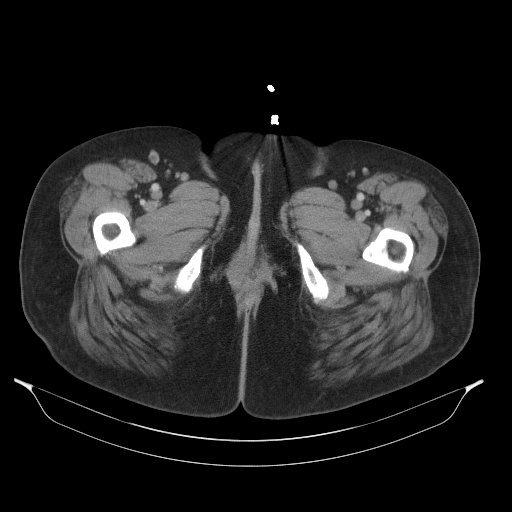
[im 6/98  bone]
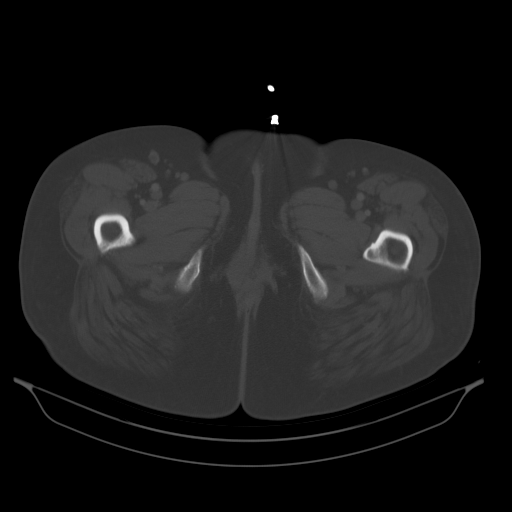
[im 16/98  soft-tissue]
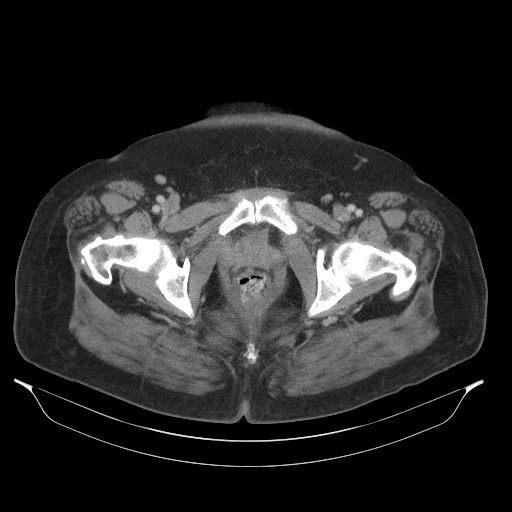
[im 21/98  soft-tissue]
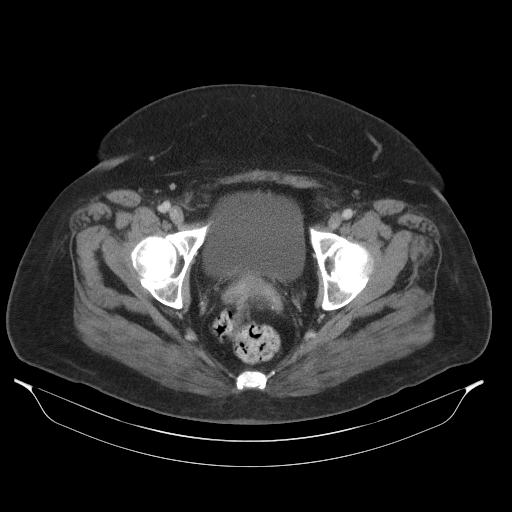
[im 26/98  soft-tissue]
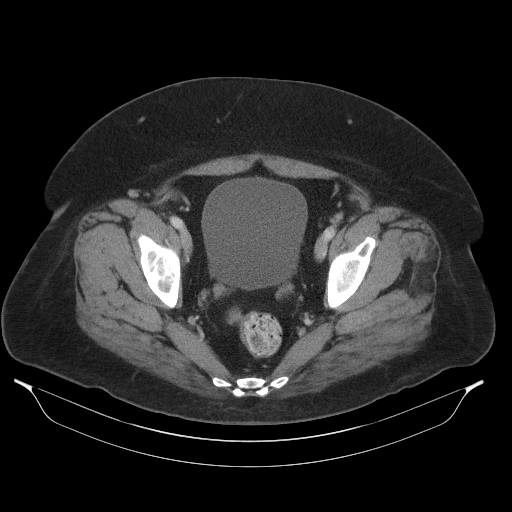
[im 36/98  soft-tissue]
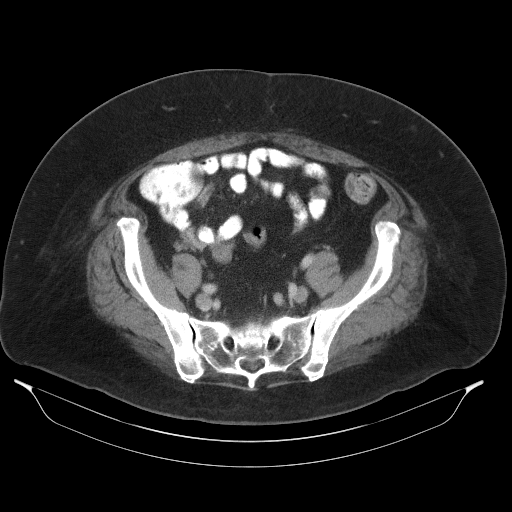
[im 41/98  soft-tissue]
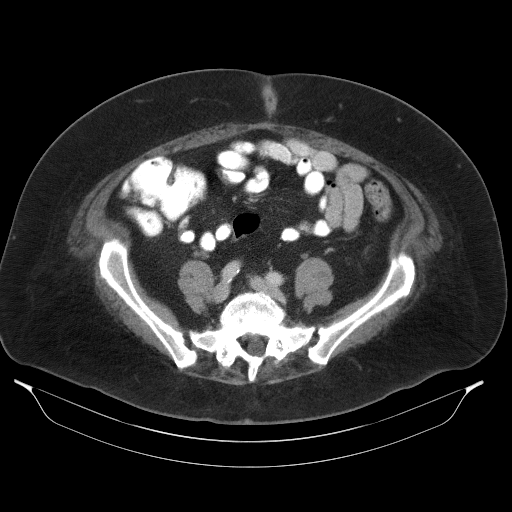
[im 52/98  soft-tissue]
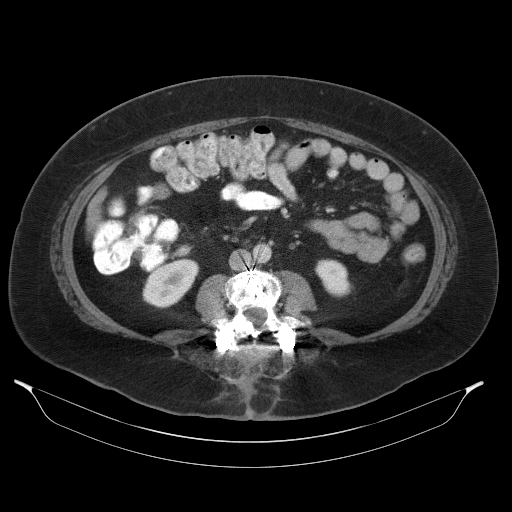
[im 57/98  soft-tissue]
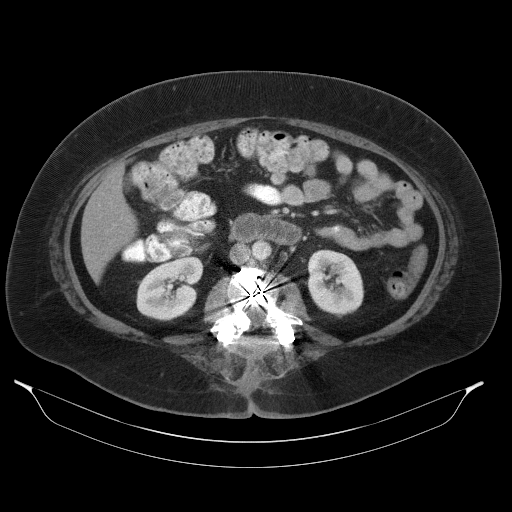
[im 62/98  soft-tissue]
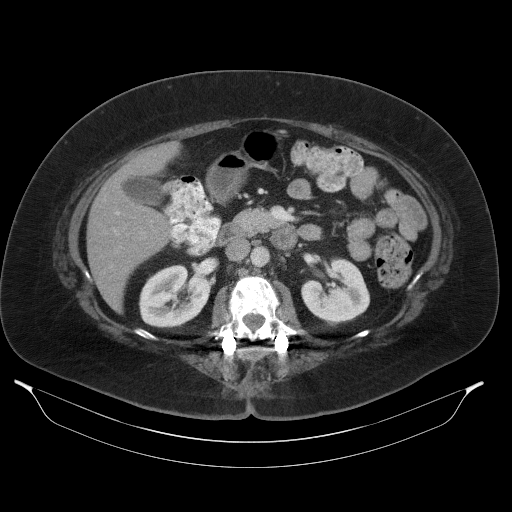
[im 62/98  bone]
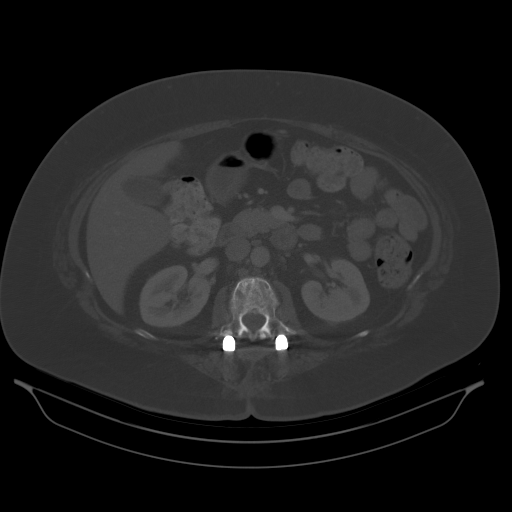
[im 72/98  soft-tissue]
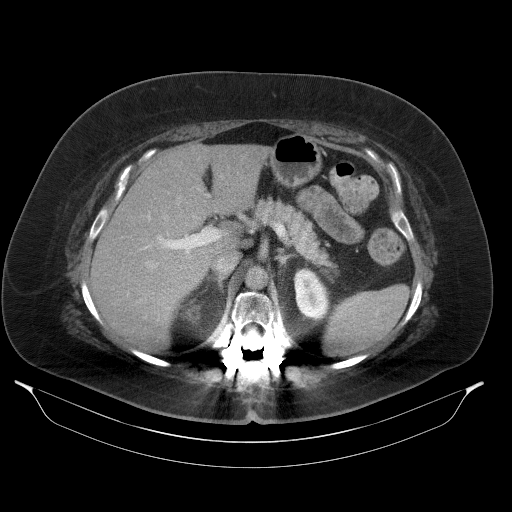
[im 77/98  soft-tissue]
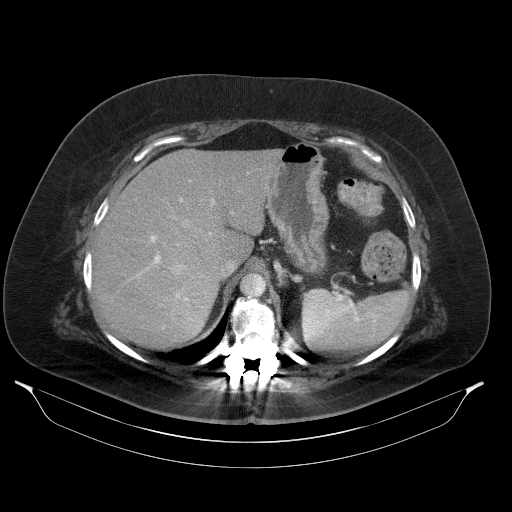
[im 82/98  soft-tissue]
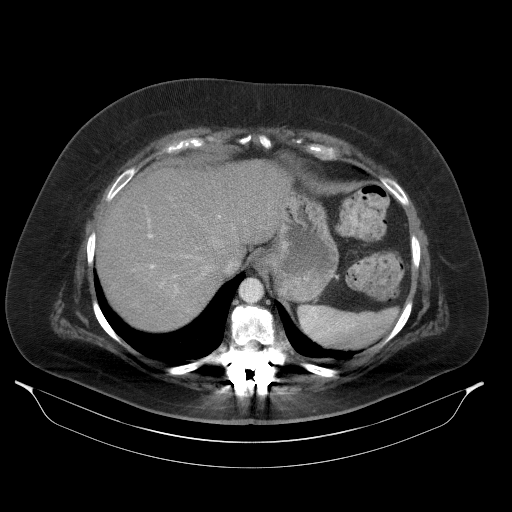
[im 92/98  soft-tissue]
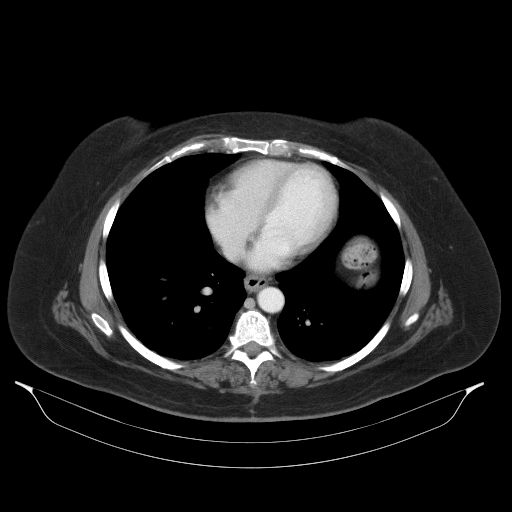

[Series 3: cor · coronal · 0.93mm/px · 3 of 97 slices shown]
[im 33/97  soft-tissue]
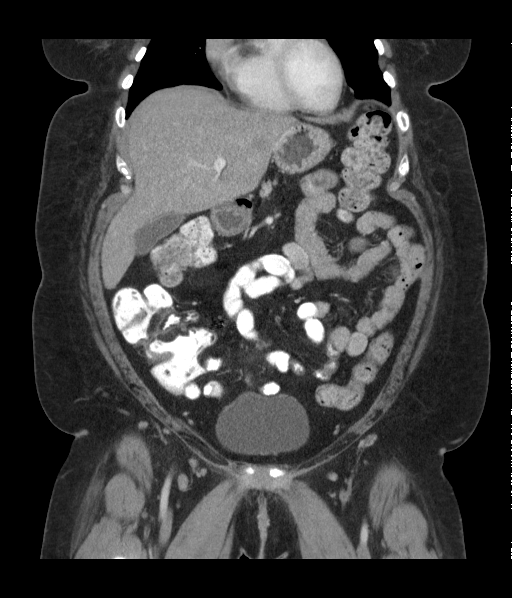
[im 43/97  soft-tissue]
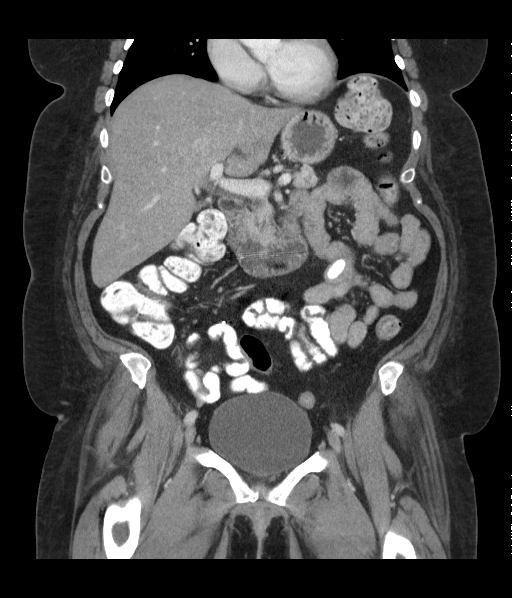
[im 54/97  soft-tissue]
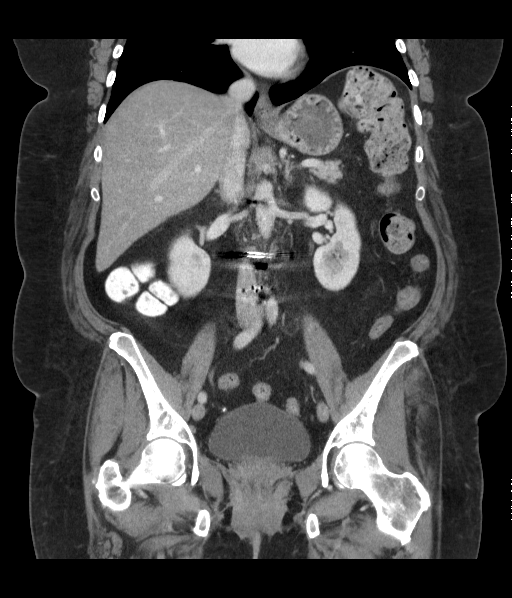

[16 of 46 positions shown; findings below may reference images not displayed]

FINDINGS: Lower chest: Clear lung bases. No significant pleural or pericardial
effusion.

Hepatobiliary: The liver is normal in density without focal
abnormality. No evidence of gallstones, gallbladder wall thickening
or biliary dilatation.

Pancreas: Unremarkable. No pancreatic ductal dilatation or
surrounding inflammatory changes.

Spleen: Normal in size without suspicious abnormality. There is a
tiny low-density lesion inferiorly on image 26 of doubtful
significance.

Adrenals/Urinary Tract: Both adrenal glands appear normal. The
kidneys appear normal without evidence of urinary tract calculus,
suspicious lesion or hydronephrosis. No bladder abnormalities are
seen.

Stomach/Bowel: No evidence of bowel wall thickening, distention or
surrounding inflammatory change. There are surgical clips adjacent
to the cecum consistent with previous appendectomy. There is a
presumed pill fragment within the small bowel in the left mid
abdomen.

Vascular/Lymphatic: There are no enlarged abdominal or pelvic lymph
nodes. There is mild iliac atherosclerosis.

Reproductive: Hysterectomy. There is a 3.0 cm low-density right
adnexal lesion on image 67, probably a small cyst of the right
ovary. The left ovary is not visualized.

Other: No evidence of abdominal wall mass or hernia.

Musculoskeletal: No acute osseous findings. There are extensive
postsurgical changes throughout the spine status post fusion from
T10 through the upper sacrum. There is prominent lucency surrounding
the T10 pedicle screws bilaterally as seen on recent T-spine CT.
IMPRESSION: 1. No acute findings or explanation for the patient's symptoms.
2. Probable incidental small cyst of the right ovary.
3. Loosening of the T10 pedicle screws as seen on recent thoracic
spine CT

## 2016-05-07 ENCOUNTER — Ambulatory Visit (HOSPITAL_COMMUNITY)
Admission: EM | Admit: 2016-05-07 | Discharge: 2016-05-07 | Disposition: A | Discharge status: Home / Self Care | Payer: 59 | Attending: Family Medicine | Admitting: Family Medicine

## 2016-05-07 ENCOUNTER — Encounter (HOSPITAL_COMMUNITY): Payer: Self-pay

## 2016-05-07 DIAGNOSIS — N3000 Acute cystitis without hematuria: Secondary | ICD-10-CM

## 2016-05-07 DIAGNOSIS — R300 Dysuria: Secondary | ICD-10-CM | POA: Diagnosis not present

## 2016-05-07 DIAGNOSIS — Z8249 Family history of ischemic heart disease and other diseases of the circulatory system: Secondary | ICD-10-CM | POA: Diagnosis not present

## 2016-05-07 DIAGNOSIS — I10 Essential (primary) hypertension: Secondary | ICD-10-CM | POA: Insufficient documentation

## 2016-05-07 DIAGNOSIS — Z833 Family history of diabetes mellitus: Secondary | ICD-10-CM | POA: Diagnosis not present

## 2016-05-07 DIAGNOSIS — Z888 Allergy status to other drugs, medicaments and biological substances status: Secondary | ICD-10-CM | POA: Insufficient documentation

## 2016-05-07 DIAGNOSIS — R110 Nausea: Secondary | ICD-10-CM | POA: Diagnosis not present

## 2016-05-07 DIAGNOSIS — Z9889 Other specified postprocedural states: Secondary | ICD-10-CM | POA: Insufficient documentation

## 2016-05-07 DIAGNOSIS — K219 Gastro-esophageal reflux disease without esophagitis: Secondary | ICD-10-CM | POA: Diagnosis not present

## 2016-05-07 DIAGNOSIS — F319 Bipolar disorder, unspecified: Secondary | ICD-10-CM | POA: Insufficient documentation

## 2016-05-07 DIAGNOSIS — Z825 Family history of asthma and other chronic lower respiratory diseases: Secondary | ICD-10-CM | POA: Insufficient documentation

## 2016-05-07 DIAGNOSIS — Z79899 Other long term (current) drug therapy: Secondary | ICD-10-CM | POA: Insufficient documentation

## 2016-05-07 DIAGNOSIS — Z880 Allergy status to penicillin: Secondary | ICD-10-CM | POA: Diagnosis not present

## 2016-05-07 DIAGNOSIS — R350 Frequency of micturition: Secondary | ICD-10-CM | POA: Insufficient documentation

## 2016-05-07 DIAGNOSIS — Z809 Family history of malignant neoplasm, unspecified: Secondary | ICD-10-CM | POA: Diagnosis not present

## 2016-05-07 DIAGNOSIS — F419 Anxiety disorder, unspecified: Secondary | ICD-10-CM | POA: Insufficient documentation

## 2016-05-07 DIAGNOSIS — J45909 Unspecified asthma, uncomplicated: Secondary | ICD-10-CM | POA: Insufficient documentation

## 2016-05-07 DIAGNOSIS — G4730 Sleep apnea, unspecified: Secondary | ICD-10-CM | POA: Insufficient documentation

## 2016-05-07 LAB — POCT URINALYSIS DIP (DEVICE)
Bilirubin Urine: NEGATIVE
Glucose, UA: NEGATIVE mg/dL
Hgb urine dipstick: NEGATIVE
Ketones, ur: NEGATIVE mg/dL
Nitrite: NEGATIVE
Protein, ur: NEGATIVE mg/dL
Specific Gravity, Urine: 1.025 (ref 1.005–1.030)
Urobilinogen, UA: 0.2 mg/dL (ref 0.0–1.0)
pH: 7 (ref 5.0–8.0)

## 2016-05-07 MED ORDER — PHENAZOPYRIDINE HCL 200 MG PO TABS: 200.0000 mg | ORAL_TABLET | Freq: Three times a day (TID) | ORAL | 0 refills | Status: DC | PRN

## 2016-05-07 MED ORDER — CEPHALEXIN 500 MG PO CAPS: 500.0000 mg | ORAL_CAPSULE | Freq: Four times a day (QID) | ORAL | 0 refills | Status: DC

## 2016-05-07 NOTE — Discharge Instructions (Addendum)
You are being treated today for a urinary tract infection. I have prescribed Keflex, take 1 tablet 4 times a day for 5 days. I have also prescribed Pyridium. Take 1 tablet a day 3 times a day for 2 days. Your urine will be sent for culture and you will be notified should any change in therapy be needed. Drink plenty of fluids and rest. Should your symptoms fail to resolve, follow up with your primary care provider or return to clinic.  °

## 2016-05-07 NOTE — ED Provider Notes (Signed)
CSN: [id number]     Arrival date & time 05/07/16  1725 History   First MD Initiated Contact with Patient 05/07/16 2011     Chief Complaint  Patient presents with  . Urinary Tract Infection   (Consider location/radiation/quality/duration/timing/severity/associated sxs/prior Treatment) 51 year old female presents with with three day history of urinary frequency, dysuria, urgency, flank pain, and nausea. Was seen one month ago for similar symptoms and treated with macrodantin, she states she feels as if her symptoms improved but never fully cleared. She denies fever but has had chills.   The history is provided by the patient.  Urinary Tract Infection  Associated symptoms: flank pain and nausea   Associated symptoms: no abdominal pain, no fever, no vaginal discharge and no vomiting     Past Medical History:  Diagnosis Date  . Anxiety   . Asthma   . Bipolar 1 disorder ([place])    ect treatments last treatment Sep 02 1011  . Depression   . GERD (gastroesophageal reflux disease)   . Hypertension   . Seizures ([place])    last seizure was so long ago she can't remember.   . Sleep apnea    Past Surgical History:  Procedure Laterality Date  . ABDOMINAL HYSTERECTOMY    . BACK SURGERY    . CARPAL TUNNEL RELEASE     x2  . Laproscopic knee surgery    . PLANTAR FASCIA RELEASE     x2   Family History  Problem Relation Age of Onset  . Diabetes Mother   . COPD Mother   . Hypertension Mother   . Hyperlipidemia Mother   . Heart disease Father   . Hyperlipidemia Father   . Hypertension Father   . Cancer Father   . Heart disease Brother   . Heart disease Daughter    Social History  Substance Use Topics  . Smoking status: Never Smoker  . Smokeless tobacco: Never Used  . Alcohol use No   OB History    No data available     Review of Systems  Constitutional: Positive for chills. Negative for appetite change, fatigue and fever.  Respiratory: Negative.   Cardiovascular: Negative.    Gastrointestinal: Positive for nausea. Negative for abdominal pain, diarrhea and vomiting.  Genitourinary: Positive for dysuria, flank pain, frequency and urgency. Negative for hematuria, pelvic pain, vaginal discharge and vaginal pain.  Skin: Negative.   Neurological: Negative for dizziness, syncope, weakness and light-headedness.  All other systems reviewed and are negative.   Allergies  Tetracyclines & related; Tramadol; Ciprofloxacin; Codeine; and Penicillins  Home Medications   Prior to Admission medications   Medication Sig Start Date End Date Taking? Authorizing Provider  AMITIZA 24 MCG capsule TAKE 1 CAPSULE TWICE A DAY WITH A MEAL 04/05/16  Yes [name], MD  benztropine (COGENTIN) 1 MG tablet Take 1 mg by mouth 2 (two) times daily.    Yes Historical Provider, MD  Calcium Carbonate-Vit D-Min (CALCIUM 1200 PO) Take 1 tablet by mouth daily.   Yes Historical Provider, MD  citalopram (CELEXA) 40 MG tablet Take 40 mg by mouth at bedtime.  05/28/11  Yes Darrol Jump, MD  clotrimazole-betamethasone (LOTRISONE) cream Apply 1 application topically 2 (two) times daily. X 2 weeks 12/25/15  Yes [name], MD  cyclobenzaprine (FLEXERIL) 10 MG tablet Take 10 mg by mouth 3 (three) times daily as needed for muscle spasms.   Yes Historical Provider, MD  fluticasone (FLONASE) 50 MCG/ACT nasal spray Place  2 sprays into both nostrils daily. 04/05/16  Yes [name], MD  gabapentin (NEURONTIN) 600 MG tablet Take 1 tablet (600 mg total) by mouth 3 (three) times daily. For anxiety and pain management. May have to stop to take ECT. 05/28/11  Yes Darrol Jump, MD  haloperidol (HALDOL) 5 MG tablet Take 1 tablet (5 mg total) by mouth at bedtime. For psychosis 11/15/15  Yes Barton Dubois, MD  hydrochlorothiazide (HYDRODIURIL) 25 MG tablet TAKE 1 TABLET EVERY DAY 09/25/15  Yes [name], MD  hydrOXYzine (ATARAX/VISTARIL) 10 MG tablet Take 10 mg by mouth 3 (three) times daily.  11/25/14  Yes  Historical Provider, MD  morphine (MS CONTIN) 30 MG 12 hr tablet  01/07/16  Yes Historical Provider, MD  Multiple Vitamins-Minerals (MULTIVITAMIN WITH MINERALS) tablet Take 1 tablet by mouth daily.   Yes Historical Provider, MD  nitroGLYCERIN (NITROSTAT) 0.4 MG SL tablet Place 1 tablet (0.4 mg total) under the tongue every 5 (five) minutes as needed for chest pain. 10/27/14  Yes Leone Brand, MD  oxyCODONE-acetaminophen (PERCOCET/ROXICET) 5-325 MG tablet Take 1 tablet by mouth every 4 (four) hours as needed for severe pain.   Yes [name], DPM  pantoprazole (PROTONIX) 40 MG tablet TAKE 1 TABLET BY MOUTH TWICE A DAY 02/18/16  Yes [name], MD  potassium chloride SA (KLOR-CON M20) 20 MEQ tablet Take 2 tablets (40 mEq total) by mouth daily. 02/04/16  Yes [name], MD  prazosin (MINIPRESS) 5 MG capsule Take 5 mg by mouth at bedtime.   Yes Historical Provider, MD  PROAIR HFA 108 (90 BASE) MCG/ACT inhaler INHALE 2 PUFFS INTO THE LUNGS EVERY 4 HOURS AS NEEDED FOR WHEEZING 10/01/14  Yes [name], MD  SUMAtriptan (IMITREX) 100 MG tablet TAKE 1 TABLET (100 MG TOTAL) BY MOUTH EVERY 2 (TWO) HOURS AS NEEDED. MIGRANES 05/27/14  Yes [name], MD  traZODone (DESYREL) 100 MG tablet Take 100 mg by mouth at bedtime. 07/29/14  Yes Historical Provider, MD  VOLTAREN 1 % GEL Apply 2 g topically daily as needed (for pain).  06/29/13  Yes Historical Provider, MD  cephALEXin (KEFLEX) 500 MG capsule Take 1 capsule (500 mg total) by mouth 4 (four) times daily. 05/07/16   Barnet Glasgow, NP  clindamycin (CLEOCIN) 300 MG capsule Take 1 capsule (300 mg total) by mouth 3 (three) times daily. 01/09/16   [name], DPM  clindamycin (CLEOCIN) 300 MG capsule Take 1 capsule (300 mg total) by mouth 3 (three) times daily. 02/13/16   [name], DPM  HYDROcodone-acetaminophen (NORCO) 10-325 MG per tablet Take 1 tablet by mouth every 8 (eight) hours.     Historical Provider, MD   nitrofurantoin (MACRODANTIN) 100 MG capsule Take 1 capsule (100 mg total) by mouth 2 (two) times daily. 03/28/16   Billy Fischer, MD  phenazopyridine (PYRIDIUM) 200 MG tablet Take 1 tablet (200 mg total) by mouth 3 (three) times daily as needed for pain. 05/07/16   Barnet Glasgow, NP  promethazine (PHENERGAN) 12.5 MG tablet TAKE 1 TABLET BY MOUTH EVERY 8 HOURS AS NEEDED FOR NAUSEA AND VOMITING 02/26/16   [name], DPM  promethazine (PHENERGAN) 12.5 MG tablet Take 1 tablet (12.5 mg total) by mouth every 8 (eight) hours as needed for nausea or vomiting. 02/25/16   [name], DPM  sulfamethoxazole-trimethoprim (BACTRIM DS,SEPTRA DS) 800-160 MG tablet Take 1 tablet by mouth 2 (two) times daily. 01/12/16   Stanton Kidney  B Dixon, PA-C   Meds Ordered and Administered this Visit  Medications - No data to display  There were no vitals taken for this visit. No data found.   Physical Exam  Constitutional: She is oriented to person, place, and time. She appears well-developed and well-nourished. No distress.  Cardiovascular: Normal rate and regular rhythm.   Pulmonary/Chest: Effort normal.  Abdominal: Soft. Bowel sounds are normal. She exhibits no distension and no mass. There is tenderness in the suprapubic area. There is CVA tenderness. There is no guarding.  Neurological: She is alert and oriented to person, place, and time.  Skin: Skin is warm and dry. Capillary refill takes less than 2 seconds. She is not diaphoretic.  Psychiatric: She has a normal mood and affect.  Nursing note and vitals reviewed.   Urgent Care Course     Procedures (including critical care time)  Labs Review Labs Reviewed  POCT URINALYSIS DIP (DEVICE) - Abnormal; Notable for the following:       Result Value   Leukocytes, UA MODERATE (*)    All other components within normal limits  URINE CULTURE    Imaging Review No results found.   Visual Acuity Review  Right Eye Distance:   Left Eye Distance:    Bilateral Distance:    Right Eye Near:   Left Eye Near:    Bilateral Near:         MDM   1. Acute cystitis without hematuria   You are being treated today for a urinary tract infection. You do have a PCN allergy but have verbalized you are able to take drugs like keflex, and other cephalosporin medications.  I have prescribed Keflex, take 1 tablet 4 times a day for 5 days. I have also prescribed Pyridium. Take 1 tablet a day 3 times a day for 2 days. Your urine will be sent for culture and you will be notified should any change in therapy be needed. Drink plenty of fluids and rest. Should your symptoms fail to resolve, follow up with your primary care provider or return to clinic.       Barnet Glasgow, NP 05/07/16 2025

## 2016-05-07 NOTE — ED Triage Notes (Signed)
Pt had a UTI since Tuesday. Having dysuria along with a lot of pain. No fever. Has not taking anything otc.

## 2016-05-09 LAB — URINE CULTURE: Culture: <10000 — AB

## 2016-05-14 ENCOUNTER — Ambulatory Visit: Payer: Medicare Other | Admitting: Podiatry

## 2016-06-02 ENCOUNTER — Ambulatory Visit: Payer: Medicare Other | Admitting: Neurology

## 2016-06-04 ENCOUNTER — Ambulatory Visit (INDEPENDENT_AMBULATORY_CARE_PROVIDER_SITE_OTHER): Payer: 59 | Admitting: Podiatry

## 2016-06-04 ENCOUNTER — Encounter: Payer: Self-pay | Admitting: Podiatry

## 2016-06-04 ENCOUNTER — Ambulatory Visit (INDEPENDENT_AMBULATORY_CARE_PROVIDER_SITE_OTHER): Payer: 59

## 2016-06-04 VITALS — BP 151/91 | HR 85 | Resp 16

## 2016-06-04 DIAGNOSIS — L97512 Non-pressure chronic ulcer of other part of right foot with fat layer exposed: Secondary | ICD-10-CM

## 2016-06-04 DIAGNOSIS — M86671 Other chronic osteomyelitis, right ankle and foot: Secondary | ICD-10-CM

## 2016-06-04 DIAGNOSIS — L02611 Cutaneous abscess of right foot: Secondary | ICD-10-CM

## 2016-06-04 DIAGNOSIS — L03031 Cellulitis of right toe: Secondary | ICD-10-CM | POA: Diagnosis not present

## 2016-06-04 MED ORDER — CLINDAMYCIN HCL 300 MG PO CAPS: 300.0000 mg | ORAL_CAPSULE | Freq: Three times a day (TID) | ORAL | 2 refills | Status: DC

## 2016-06-07 NOTE — Progress Notes (Signed)
Subjective: Ms. Fabien presents the office if any new concerns of the wound, redness of the distal portion of the right second toe which is been ongoing for greater than one month. She denies any recent injury or trauma. She has noticed a small mild of clear bloody drainage coming from the patient denies any pus. She's had no recent treatment for this. She is currently not taking antibiotics. Denies any red streaks. She does have pain to the toe. She has not noticed any reoccurrence of a wound big toe. Denies any systemic complaints such as fevers, chills, nausea, vomiting. No acute changes since last appointment, and no other complaints at this time.   Objective: AAO x3, NAD DP/PT pulses palpable bilaterally, CRT less than 3 seconds To the distal portion of the right second toe is an ulceration at the tip of the toe. The wound measures 0.4 x 0.4 x 0.2 cm. There is no probing to bone although it is getting close. There is edema and erythema to the distal portion of the toe but there is no ascending synovitis. His serous and was transit expressed there is no purulence. There is no fluctuance or crepitus. There is no malodor. Mild edema to the hallux on the right foot there is no open sore pre-ulcerative lesion identified. No open lesions or pre-ulcerative lesions.  No pain with calf compression, swelling, warmth, erythema  Assessment: Right second toe ulceration  Plan: -All treatment options discussed with the patient including all alternatives, risks, complications.  -X-rays were obtained and reviewed with the patient. No definitive evidence of acute fracture and there is no definitive evidence of acute osteomyelitis at this time. -Wound sharply debrided to the distal portion the right second toe 2 without complications. Recommended antibiotic ointment and a dressing change daily. We will restart clindamycin today due to infection. Discussed with her that if the symptoms worsen she may need at  least a partial toe amputation but hopefully this would not occur. -Monitor for any clinical signs or symptoms of infection and directed to call the office immediately should any occur or go to the ER. -RTC in 10 days or sooner if needed.  -Patient encouraged to call the office with any questions, concerns, change in symptoms.   Celesta Gentile, DPM

## 2016-06-08 ENCOUNTER — Ambulatory Visit
Admission: RE | Admit: 2016-06-08 | Discharge: 2016-06-08 | Disposition: A | Discharge status: Home / Self Care | Payer: Medicare Other | Source: Ambulatory Visit | Attending: Anesthesiology | Admitting: Anesthesiology

## 2016-06-08 ENCOUNTER — Other Ambulatory Visit: Payer: Self-pay | Admitting: Anesthesiology

## 2016-06-08 DIAGNOSIS — M542 Cervicalgia: Secondary | ICD-10-CM

## 2016-06-15 ENCOUNTER — Ambulatory Visit (INDEPENDENT_AMBULATORY_CARE_PROVIDER_SITE_OTHER): Payer: 59 | Admitting: Neurology

## 2016-06-15 ENCOUNTER — Ambulatory Visit: Payer: 59 | Admitting: Sports Medicine

## 2016-06-15 ENCOUNTER — Encounter: Payer: Self-pay | Admitting: Neurology

## 2016-06-15 VITALS — BP 136/92 | HR 92 | Resp 20 | Ht 64.0 in | Wt 240.0 lb

## 2016-06-15 DIAGNOSIS — Z9989 Dependence on other enabling machines and devices: Secondary | ICD-10-CM | POA: Diagnosis not present

## 2016-06-15 DIAGNOSIS — G4733 Obstructive sleep apnea (adult) (pediatric): Secondary | ICD-10-CM | POA: Diagnosis not present

## 2016-06-15 NOTE — Patient Instructions (Signed)
Please continue using your CPAP regularly. While your insurance requires that you use CPAP at least 4 hours each night on 70% of the nights, I recommend, that you not skip any nights and use it throughout the night if you can. Getting used to CPAP and staying with the treatment long term does take time and patience and discipline. Untreated obstructive sleep apnea when it is moderate to severe can have an adverse impact on cardiovascular health and raise her risk for heart disease, arrhythmias, hypertension, congestive heart failure, stroke and diabetes. Untreated obstructive sleep apnea causes sleep disruption, nonrestorative sleep, and sleep deprivation. This can have an impact on your day to day functioning and cause daytime sleepiness and impairment of cognitive function, memory loss, mood disturbance, and problems focussing. Using CPAP regularly can improve these symptoms.  Keep up the good work! I will see you back in 6 months for sleep apnea check up, and if you continue to do well on CPAP I will see you once a year thereafter.   

## 2016-06-15 NOTE — Progress Notes (Signed)
Subjective:    Patient ID: [name] [name] is a 51 y.o. female.  HPI     Interim history:   [name] is a 51 year old right-handed woman with an underlying medical history of bipolar disorder, hypertension, chronic pain and on chronic narcotic pain medications, reflux disease, seizure history, hypertension, anxiety, asthma, and obesity, who presents for follow-up consultation of her obstructive sleep apnea, after her recent split-night sleep study. The patient is accompanied by her mother today. I first met her on 01/12/2016 at the request of her primary care physician, at which time she reported a prior diagnosis of obstructive sleep apnea but had not used her CPAP in years. I invited her for a sleep study. She had a split-night sleep study on 03/01/2016. I went over her test results with her in detail today. Baseline sleep efficiency was 87.2%, sleep latency was 10 minutes. She had absence of REM sleep. She had an increased percentage of stage II sleep and an increased percentage of slow-wave sleep. Total AHI was 46 per hour, average oxygen saturation was 94%, nadir was 84% during non-REM sleep. She had no significant PLMS, EKG or EEG changes. She was then titrated on CPAP starting at 5 cm and advanced to 13 cm. At a pressure of 12 cm she had an AHI of 2.8 per hour, with supine REM sleep achieved on 11 cm. O2 nadir on 12 cm was 91% and she utilized a small fullface mask. She had a good amount of REM sleep at 23.7% during the second part of the study. She had moderate PLMS with an index of 37.4 per hour with very few arousals. She indicated that she slept better with CPAP. O2 nadir was 86% on treatment, average oxygen saturation improved at 96%. Based on her test results are prescribed CPAP therapy for home use.   Today, 06/15/2016 (all dictated new, as well as above notes, some dictation done in note pad or Word, outside of chart, may appear as copied):   I reviewed her CPAP compliance data from  05/14/2016 through 06/12/2016, which is a total of 30 days, during which time she used her CPAP every night with percent used days greater than 4 hours at 90%, indicating excellent compliance with an average usage of 6 hours and 23 minutes, residual AHI 4 per hour, acceptable, leak acceptable with the 95th percentile at 9.3 L/m on a pressure of 12 cm with EPR of 2. She reports feeling a little better, falls asleep a little better, feels better rested. She has had problems with her right second toe and developed an ulcer that needed treatment with antibiotics. She sees podiatry for this. She also reports more neck pain, she continues to see Dr. Patrice Paradise for this.  The patient's allergies, current medications, family history, past medical history, past social history, past surgical history and problem list were reviewed and updated as appropriate.   Previously (copied from previous notes for reference):   01/12/2016: She was previously diagnosed with obstructive sleep apnea. Sleep study results are not available for my review. She was on CPAP therapy in the past but has not used it in years.  She has a Hx of chronic back pain, had multiple upper and lower back surgeries under Dr. Patrice Paradise. Last surgery of upper spine in February 2017. She will be seeing pain management next week.  She had recent R big toe surgery and has to wear a boot for a total of 6 weeks, has been 2 weeks thus far.  Has R knee arthroscopic surgery planned for next month, under Dr. French Ana. She had repeat O2 drops in the hospital, she recalls post-surgery. Of note, she was recently hospitalized for chest pain. Workup was negative for cardiac events. She was found to have hypokalemia. I reviewed your office note from 11/20/2015 and 11/25/2015. She has been witnessed to have apneic pauses while asleep per mom. Her Epworth sleepiness score is 17 out of 24 today, her fatigue score is 30 out of 63. She is a nonsmoker. She lives with her mother, has  3 children, is separated. She does not drink alcohol. She quit using illicit drugs in 4098. She drinks quite a bit of caffeine in the form of sodas, 4 16 ounce cups per day.  Bedtime is around 11 PM, then the mornings she gets out of bed around 10 AM. She does not wake up rested. She also endorses restless leg symptoms and leg twitching and kicking at night. She has a family history of restless leg syndrome and sleep apnea. Her mother has a BiPAP machine, her father had a BiPAP machine as well, he died at 26 from lung cancer.  Her Past Medical History Is Significant For: Past Medical History:  Diagnosis Date  . Anxiety   . Asthma   . Bipolar 1 disorder ([place])    ect treatments last treatment Sep 02 1011  . Depression   . GERD (gastroesophageal reflux disease)   . Hypertension   . Seizures ([place])    last seizure was so long ago she can't remember.   . Sleep apnea     Her Past Surgical History Is Significant For: Past Surgical History:  Procedure Laterality Date  . ABDOMINAL HYSTERECTOMY    . BACK SURGERY    . CARPAL TUNNEL RELEASE     x2  . Laproscopic knee surgery    . PLANTAR FASCIA RELEASE     x2    Her Family History Is Significant For: Family History  Problem Relation Age of Onset  . Diabetes Mother   . COPD Mother   . Hypertension Mother   . Hyperlipidemia Mother   . Heart disease Father   . Hyperlipidemia Father   . Hypertension Father   . Cancer Father   . Heart disease Brother   . Heart disease Daughter     Her Social History Is Significant For: Social History   Social History  . Marital status: Legally Separated    Spouse name: N/A  . Number of children: 3  . Years of education: 12   Occupational History  . Disabled    Social History Main Topics  . Smoking status: Never Smoker  . Smokeless tobacco: Never Used  . Alcohol use No  . Drug use: No  . Sexual activity: No   Other Topics Concern  . None   Social History Narrative   Patient drink 4  16oz caffeine drinks a day     Her Allergies Are:  Allergies  Allergen Reactions  . Tetracyclines & Related Other (See Comments)    syncope  . Tramadol Other (See Comments)    seizures  . Ciprofloxacin Rash  . Codeine Itching  . Penicillins Hives and Rash  :   Her Current Medications Are:  Outpatient Encounter Prescriptions as of 06/15/2016  Medication Sig  . AMITIZA 24 MCG capsule TAKE 1 CAPSULE TWICE A DAY WITH A MEAL  . benztropine (COGENTIN) 1 MG tablet Take 1 mg by mouth 2 (two) times daily.   Marland Kitchen  Calcium Carbonate-Vit D-Min (CALCIUM 1200 PO) Take 1 tablet by mouth daily.  . citalopram (CELEXA) 40 MG tablet Take 40 mg by mouth at bedtime.   . clotrimazole-betamethasone (LOTRISONE) cream Apply 1 application topically 2 (two) times daily. X 2 weeks  . cyclobenzaprine (FLEXERIL) 10 MG tablet Take 10 mg by mouth 3 (three) times daily as needed for muscle spasms.  . fluticasone (FLONASE) 50 MCG/ACT nasal spray Place 2 sprays into both nostrils daily.  Marland Kitchen gabapentin (NEURONTIN) 600 MG tablet Take 1 tablet (600 mg total) by mouth 3 (three) times daily. For anxiety and pain management. May have to stop to take ECT.  . haloperidol (HALDOL) 5 MG tablet Take 1 tablet (5 mg total) by mouth at bedtime. For psychosis  . hydrochlorothiazide (HYDRODIURIL) 25 MG tablet TAKE 1 TABLET EVERY DAY  . hydrOXYzine (ATARAX/VISTARIL) 10 MG tablet Take 10 mg by mouth 3 (three) times daily.   Marland Kitchen morphine (MS CONTIN) 30 MG 12 hr tablet   . Multiple Vitamins-Minerals (MULTIVITAMIN WITH MINERALS) tablet Take 1 tablet by mouth daily.  . nitroGLYCERIN (NITROSTAT) 0.4 MG SL tablet Place 1 tablet (0.4 mg total) under the tongue every 5 (five) minutes as needed for chest pain.  Marland Kitchen oxyCODONE-acetaminophen (PERCOCET/ROXICET) 5-325 MG tablet Take 1 tablet by mouth every 4 (four) hours as needed for severe pain.  . pantoprazole (PROTONIX) 40 MG tablet TAKE 1 TABLET BY MOUTH TWICE A DAY  . potassium chloride SA (KLOR-CON  M20) 20 MEQ tablet Take 2 tablets (40 mEq total) by mouth daily.  . prazosin (MINIPRESS) 5 MG capsule Take 5 mg by mouth at bedtime.  Marland Kitchen PROAIR HFA 108 (90 BASE) MCG/ACT inhaler INHALE 2 PUFFS INTO THE LUNGS EVERY 4 HOURS AS NEEDED FOR WHEEZING  . SUMAtriptan (IMITREX) 100 MG tablet TAKE 1 TABLET (100 MG TOTAL) BY MOUTH EVERY 2 (TWO) HOURS AS NEEDED. MIGRANES  . traZODone (DESYREL) 100 MG tablet Take 100 mg by mouth at bedtime.  . VOLTAREN 1 % GEL Apply 2 g topically daily as needed (for pain).   . [DISCONTINUED] cephALEXin (KEFLEX) 500 MG capsule Take 1 capsule (500 mg total) by mouth 4 (four) times daily.  . [DISCONTINUED] clindamycin (CLEOCIN) 300 MG capsule Take 1 capsule (300 mg total) by mouth 3 (three) times daily.  . [DISCONTINUED] clindamycin (CLEOCIN) 300 MG capsule Take 1 capsule (300 mg total) by mouth 3 (three) times daily.  . [DISCONTINUED] clindamycin (CLEOCIN) 300 MG capsule Take 1 capsule (300 mg total) by mouth 3 (three) times daily.  . [DISCONTINUED] HYDROcodone-acetaminophen (NORCO) 10-325 MG per tablet Take 1 tablet by mouth every 8 (eight) hours.   . [DISCONTINUED] nitrofurantoin (MACRODANTIN) 100 MG capsule Take 1 capsule (100 mg total) by mouth 2 (two) times daily.  . [DISCONTINUED] phenazopyridine (PYRIDIUM) 200 MG tablet Take 1 tablet (200 mg total) by mouth 3 (three) times daily as needed for pain.  . [DISCONTINUED] promethazine (PHENERGAN) 12.5 MG tablet TAKE 1 TABLET BY MOUTH EVERY 8 HOURS AS NEEDED FOR NAUSEA AND VOMITING  . [DISCONTINUED] promethazine (PHENERGAN) 12.5 MG tablet Take 1 tablet (12.5 mg total) by mouth every 8 (eight) hours as needed for nausea or vomiting.  . [DISCONTINUED] sulfamethoxazole-trimethoprim (BACTRIM DS,SEPTRA DS) 800-160 MG tablet Take 1 tablet by mouth 2 (two) times daily.   No facility-administered encounter medications on file as of 06/15/2016.   :  Review of Systems:  Out of a complete 14 point review of systems, all are reviewed and  negative with the exception  of these symptoms as listed below: Review of Systems  Neurological:       Pt presents today to discuss her cpap. Pt feels better when she uses her cpap.    Objective:  Neurologic Exam  Physical Exam Physical Examination:   Vitals:   06/15/16 0927  BP: (!) 136/92  Pulse: 92  Resp: 20    General Examination: The patient is a very pleasant 51 y.o. female in no acute distress. She appears well-developed and well-nourished and well groomed.   HEENT: Normocephalic, atraumatic, pupils are equal, round and reactive to light and accommodation. Extraocular tracking is good without limitation to gaze excursion or nystagmus noted. Normal smooth pursuit is noted. Hearing is grossly intact. Face is symmetric with normal facial animation and normal facial sensation. Speech is clear with no dysarthria noted. There is no hypophonia. There is no lip, neck/head, jaw or voice tremor. Neck is supple with full range of passive and active motion. There are no carotid bruits on auscultation. Oropharynx exam reveals: mild mouth dryness, adequate dental hygiene and moderate airway crowding. Mallampati is class II. Tongue protrudes centrally and palate elevates symmetrically. Tonsils are 2+ in size. Redness over nasal bridge from [facility].   Chest: Clear to auscultation without wheezing, rhonchi or crackles noted.  Heart: S1+S2+0, regular and normal without murmurs, rubs or gallops noted.   Abdomen: Soft, non-tender and non-distended with normal bowel sounds appreciated on auscultation.  Extremities: There is no pitting edema in the distal lower extremities bilaterally. Pedal pulses are intact.  Skin: Warm and dry without trophic changes noted.  Musculoskeletal: exam reveals no obvious joint deformities, tenderness or joint swelling or erythema.   Neurologically:  Mental status: The patient is awake, alert and oriented in all 4 spheres. Her immediate and remote memory, attention,  language skills and fund of knowledge are appropriate. There is no evidence of aphasia, agnosia, apraxia or anomia. Speech is clear with normal prosody and enunciation. Thought process is linear. Mood is normal and affect is normal.  Cranial nerves II - XII are as described above under HEENT exam. In addition: shoulder shrug is normal with equal shoulder height noted. Motor exam: Normal bulk, strength and tone is noted. There is no drift, tremor or rebound. Romberg is negative. Reflexes are 2+ throughout. Fine motor skills and coordination: intact.  Cerebellar testing: No dysmetria or intention tremor on finger to nose testing. Heel to shin is difficult for her.  Sensory exam: intact to light touch in the upper and lower extremities.  Gait, station and balance: She stands with mild difficulty. No veering to one side is noted. No leaning to one side is noted. Posture is age-appropriate and stance is narrow based. Gait shows very mild limp on the right, tandem walk is difficult for her.               Assessment and Plan:  In summary, [name] [name] is a very pleasant 51 y.o.-year old female with An underlying medical history of chronic neck and back pain, bipolar disease, seizure history, hypertension, anxiety, reflux disease, asthma and obesity, who returns for follow-up consultation of her obstructive sleep apnea, after her recent split-night sleep study. She has split-night sleep study on 03/01/2016 which confirmed her diagnosis of severe obstructive sleep apnea. She did well with CPAP therapy. She has established CPAP treatment at home and is compliant with treatment. She is commended for this. She feels improved with respect to her sleep quality, sleep consolidation and daytime somnolence.  She is advised to try to make it enough time for sleep and increase her average usage time. She is advised to continue to work on weight loss. Physical exam is otherwise stable. We talked in detail about her sleep  study results as well as her compliance data. At this juncture I suggested a 6 month follow-up, she can see one of our nurse practitioners at the time and I will see her back after that. We can hopefully see her on a yearly basis if things go well. I will prescribe a pad for her full facemask that she can place a histover the nasal bridge to help with her nasal irritation. I answered all her questions today, in particular her questions regarding supplies and replacement of her CPAP related supplies. The patient and her mother were in agreement with the plan.  I spent 25 minutes in total face-to-face time with the patient, more than 50% of which was spent in counseling and coordination of care, reviewing test results, reviewing medication and discussing or reviewing the diagnosis of OSA, its prognosis and treatment options. Pertinent laboratory and imaging test results that were available during this visit with the patient were reviewed by me and considered in my medical decision making (see chart for details).

## 2016-06-19 ENCOUNTER — Other Ambulatory Visit: Payer: Self-pay | Admitting: Family Medicine

## 2016-06-21 ENCOUNTER — Ambulatory Visit (INDEPENDENT_AMBULATORY_CARE_PROVIDER_SITE_OTHER): Payer: 59 | Admitting: Podiatry

## 2016-06-21 ENCOUNTER — Encounter: Payer: Self-pay | Admitting: Podiatry

## 2016-06-21 ENCOUNTER — Other Ambulatory Visit: Payer: Self-pay | Admitting: Family Medicine

## 2016-06-21 VITALS — BP 148/88 | HR 89 | Temp 98.0°F | Resp 18

## 2016-06-21 DIAGNOSIS — L03031 Cellulitis of right toe: Secondary | ICD-10-CM

## 2016-06-21 DIAGNOSIS — M86171 Other acute osteomyelitis, right ankle and foot: Secondary | ICD-10-CM | POA: Diagnosis not present

## 2016-06-21 DIAGNOSIS — L02611 Cutaneous abscess of right foot: Secondary | ICD-10-CM

## 2016-06-21 DIAGNOSIS — R7309 Other abnormal glucose: Secondary | ICD-10-CM

## 2016-06-21 MED ORDER — ALBUTEROL SULFATE HFA 108 (90 BASE) MCG/ACT IN AERS: INHALATION_SPRAY | RESPIRATORY_TRACT | 3 refills | Status: DC

## 2016-06-21 MED ORDER — SULFAMETHOXAZOLE-TRIMETHOPRIM 800-160 MG PO TABS: 1.0000 | ORAL_TABLET | Freq: Two times a day (BID) | ORAL | 0 refills | Status: DC

## 2016-06-21 MED ORDER — CLINDAMYCIN HCL 300 MG PO CAPS: 300.0000 mg | ORAL_CAPSULE | Freq: Three times a day (TID) | ORAL | 2 refills | Status: DC

## 2016-06-21 NOTE — Patient Instructions (Signed)

## 2016-06-25 ENCOUNTER — Ambulatory Visit (INDEPENDENT_AMBULATORY_CARE_PROVIDER_SITE_OTHER): Payer: 59 | Admitting: Family Medicine

## 2016-06-25 ENCOUNTER — Encounter: Payer: Self-pay | Admitting: Family Medicine

## 2016-06-25 VITALS — BP 136/90 | HR 76 | Temp 98.2°F | Resp 20 | Ht 63.5 in | Wt 240.0 lb

## 2016-06-25 DIAGNOSIS — L309 Dermatitis, unspecified: Secondary | ICD-10-CM | POA: Diagnosis not present

## 2016-06-25 DIAGNOSIS — L02611 Cutaneous abscess of right foot: Secondary | ICD-10-CM | POA: Diagnosis not present

## 2016-06-25 DIAGNOSIS — R7309 Other abnormal glucose: Secondary | ICD-10-CM | POA: Diagnosis not present

## 2016-06-25 NOTE — Progress Notes (Signed)
Subjective:    Patient ID: [name] [name], female    DOB: 12/15/1965, 51 y.o.   MRN: [id number]  HPI Patient Complains of a rash that comes and goes and encircles her lower neck and upper chest. It is erythematous dry scaly and itchy. It usually last 2 or 3 days and resolve spontaneously. It tends to be worse in cold dry weather. She denies any blisters. She denies any papules. It is more erythematous macular rash with dry flaky skin. However today on exam the rash is completely gone and there is no visible rash to see. She is also requesting that we draw lab work for her podiatrist, Dr. Earleen Newport. They're planning to take her tone is weak. They're getting relevant blood work pertaining to the surgery including a CBC, CMP, A1c, and a sedimentation rate with CRP.  I will gladly draw his lab work while the patient's here Past Medical History:  Diagnosis Date  . Anxiety   . Asthma   . Bipolar 1 disorder ([place])    ect treatments last treatment Sep 02 1011  . Depression   . GERD (gastroesophageal reflux disease)   . Hypertension   . Seizures ([place])    last seizure was so long ago she can't remember.   . Sleep apnea    Past Surgical History:  Procedure Laterality Date  . ABDOMINAL HYSTERECTOMY    . BACK SURGERY    . CARPAL TUNNEL RELEASE     x2  . Laproscopic knee surgery    . PLANTAR FASCIA RELEASE     x2   Current Outpatient Prescriptions on File Prior to Visit  Medication Sig Dispense Refill  . albuterol (PROAIR HFA) 108 (90 Base) MCG/ACT inhaler INHALE 2 PUFFS INTO THE LUNGS EVERY 4 HOURS AS NEEDED FOR WHEEZING 8.5 Inhaler 3  . AMITIZA 24 MCG capsule TAKE 1 CAPSULE TWICE A DAY WITH A MEAL 60 capsule 5  . Calcium Carbonate-Vit D-Min (CALCIUM 1200 PO) Take 1 tablet by mouth daily.    . citalopram (CELEXA) 40 MG tablet Take 40 mg by mouth at bedtime.     . clindamycin (CLEOCIN) 300 MG capsule Take 1 capsule (300 mg total) by mouth 3 (three) times daily. 30 capsule 2  . cyclobenzaprine  (FLEXERIL) 10 MG tablet Take 10 mg by mouth 3 (three) times daily as needed for muscle spasms.    . fluticasone (FLONASE) 50 MCG/ACT nasal spray Place 2 sprays into both nostrils daily. 48 g 2  . gabapentin (NEURONTIN) 600 MG tablet Take 1 tablet (600 mg total) by mouth 3 (three) times daily. For anxiety and pain management. May have to stop to take ECT. 90 tablet 0  . haloperidol (HALDOL) 5 MG tablet Take 1 tablet (5 mg total) by mouth at bedtime. For psychosis    . hydrochlorothiazide (HYDRODIURIL) 25 MG tablet TAKE 1 TABLET EVERY DAY 90 tablet 3  . hydrOXYzine (ATARAX/VISTARIL) 10 MG tablet Take 10 mg by mouth 3 (three) times daily.     Marland Kitchen morphine (MS CONTIN) 30 MG 12 hr tablet     . Multiple Vitamins-Minerals (MULTIVITAMIN WITH MINERALS) tablet Take 1 tablet by mouth daily.    . nitroGLYCERIN (NITROSTAT) 0.4 MG SL tablet Place 1 tablet (0.4 mg total) under the tongue every 5 (five) minutes as needed for chest pain. 30 tablet 0  . oxyCODONE-acetaminophen (PERCOCET/ROXICET) 5-325 MG tablet Take 1 tablet by mouth every 4 (four) hours as needed for severe pain.    . pantoprazole (  PROTONIX) 40 MG tablet TAKE 1 TABLET BY MOUTH TWICE A DAY 180 tablet 2  . potassium chloride SA (KLOR-CON M20) 20 MEQ tablet Take 2 tablets (40 mEq total) by mouth daily. 180 tablet 3  . prazosin (MINIPRESS) 5 MG capsule Take 5 mg by mouth at bedtime.    . sulfamethoxazole-trimethoprim (BACTRIM DS,SEPTRA DS) 800-160 MG tablet Take 1 tablet by mouth 2 (two) times daily. 20 tablet 0  . SUMAtriptan (IMITREX) 100 MG tablet TAKE 1 TABLET (100 MG TOTAL) BY MOUTH EVERY 2 (TWO) HOURS AS NEEDED. MIGRANES 10 tablet 2  . traZODone (DESYREL) 100 MG tablet Take 100 mg by mouth at bedtime.  0  . [DISCONTINUED] lithium carbonate (LITHOBID) 300 MG CR tablet Take 1 tablet (300 mg total) by mouth every 12 (twelve) hours. For mood swings and depression 60 tablet 0   No current facility-administered medications on file prior to visit.     Allergies  Allergen Reactions  . Tetracyclines & Related Other (See Comments)    syncope  . Tramadol Other (See Comments)    seizures  . Ciprofloxacin Rash  . Codeine Itching  . Penicillins Hives and Rash   Social History   Social History  . Marital status: Legally Separated    Spouse name: N/A  . Number of children: 3  . Years of education: 12   Occupational History  . Disabled    Social History Main Topics  . Smoking status: Never Smoker  . Smokeless tobacco: Never Used  . Alcohol use No  . Drug use: No  . Sexual activity: No   Other Topics Concern  . Not on file   Social History Narrative   Patient drink 4 16oz caffeine drinks a day       Review of Systems  All other systems reviewed and are negative.      Objective:   Physical Exam  Constitutional: She appears well-developed and well-nourished. No distress.  Cardiovascular: Normal rate and regular rhythm.  Exam reveals no gallop and no friction rub.   No murmur heard. Pulmonary/Chest: Effort normal and breath sounds normal. No respiratory distress. She has no wheezes. She has no rales.  Skin: No rash noted. She is not diaphoretic. No erythema.       Assessment & Plan:  Abscess of right foot - Plan: CBC with Differential/Platelet, BASIC METABOLIC PANEL WITH GFR, Sedimentation rate, Hemoglobin A1c  Other abnormal glucose - Plan: CBC with Differential/Platelet, BASIC METABOLIC PANEL WITH GFR, Sedimentation rate, Hemoglobin A1c  Eczema, unspecified type  Diagnosis is limited by the fact there is no rash to be seen today on exam. However the episodic nature, the relationship to cold dry weather, the dry cracked skin with erythema, all makes me concerned about atopic dermatitis. Therefore I have empirically recommended that she try treating the rash by using daily moisturizers 2-3 times a day when the rash appears such as aquaphor or eucerin.  If the rash worsens, we could then use topical low potency  steroids such as triamcinolone. Again, is hard to determine because the rash is there is no rash to be seen today.  I will also draw the lab work requested by her podiatrist for her upcoming surgery to remove her toe with chronic osteomyelitis

## 2016-06-26 LAB — BASIC METABOLIC PANEL WITH GFR
BUN: 17 mg/dL (ref 7–25)
CO2: 29 mmol/L (ref 20–31)
Calcium: 9.4 mg/dL (ref 8.6–10.4)
Chloride: 99 mmol/L (ref 98–110)
Creat: 0.61 mg/dL (ref 0.50–1.05)
GFR, Est African American: >89 mL/min (ref >=60)
GFR, Est Non African American: >89 mL/min (ref >=60)
Glucose, Bld: 83 mg/dL (ref 70–99)
Potassium: 4.3 mmol/L (ref 3.5–5.3)
Sodium: 135 mmol/L (ref 135–146)

## 2016-06-26 LAB — SEDIMENTATION RATE: Sed Rate: 36 mm/hr — ABNORMAL HIGH (ref 0–30)

## 2016-06-26 LAB — HEMOGLOBIN A1C
Hgb A1c MFr Bld: 5.3 % (ref <5.7)
Mean Plasma Glucose: 105 mg/dL

## 2016-06-26 LAB — CBC WITH DIFFERENTIAL/PLATELET
Basophils Absolute: 0 cells/uL (ref 0–200)
Basophils Relative: 0 %
Eosinophils Absolute: 74 cells/uL (ref 15–500)
Eosinophils Relative: 1 %
HCT: 39.3 % (ref 35.0–45.0)
Hemoglobin: 13.1 g/dL (ref 12.0–15.0)
Lymphocytes Relative: 35 %
Lymphs Abs: 2590 cells/uL (ref 850–3900)
MCH: 32.3 pg (ref 27.0–33.0)
MCHC: 33.3 g/dL (ref 32.0–36.0)
MCV: 97 fL (ref 80.0–100.0)
MPV: 8.3 fL (ref 7.5–12.5)
Monocytes Absolute: 444 cells/uL (ref 200–950)
Monocytes Relative: 6 %
Neutro Abs: 4292 cells/uL (ref 1500–7800)
Neutrophils Relative %: 58 %
Platelets: 299 10*3/uL (ref 140–400)
RBC: 4.05 MIL/uL (ref 3.80–5.10)
RDW: 14 % (ref 11.0–15.0)
WBC: 7.4 10*3/uL (ref 3.8–10.8)

## 2016-06-28 NOTE — Progress Notes (Signed)
Subjective: [name] [name] presents the office today for concerns and follow up evaluation of right second toe cellulitis and ulceration. She states that she wants to have the toe amputated at this point. She says the wounds painful and the rednessis not improved.she is continuing antibiotics. Denies any systemic complaints such as fevers, chills, nausea, vomiting. No acute changes since last appointment, and no other complaints at this time.   Objective: AAO x3, NAD DP/PT pulses palpable bilaterally, CRT less than 3 seconds Right second toe with cellulitis of level of the PIPJ. There is an ulceration of the distal aspect of the toe which probes directly down to bone unable to palpate bone today. There is no drainage or pus ing cellulitis. Mild  No fluctuance, crepitus malodor. No open lesions or pre-ulcerative lesions.  No pain with calf compression, swelling, warmth, erythema  Assessment: Right second toe ulceration with cellulitis, Osteomyelitis  Plan: -All treatment options discussed with the patient including all alternatives, risks, complications.  -At this point the wound probes directly down to bone. Discussed with her toe amputaiton. She wishes to go ahead proceed with this. Discussed partial vs. Total toe amputation. She is a high risk of transfer lesions given her other digital deformities. -The incision placement as well as the postoperative course was discussed with the patient. I discussed risks of the surgery which include, but not limited to, infection, bleeding, pain, swelling, need for further surgery, delayed or nonhealing, painful or ugly scar, numbness or sensation changes, over/under correction, recurrence, transfer lesions, further deformity, hardware failure, DVT/PE, loss of toe/foot. Patient understands these risks and wishes to proceed with surgery. The surgical consent was reviewed with the patient all 3 pages were signed. No promises or guarantees were given to the outcome of  the procedure. All questions were answered to the best of my ability. Before the surgery the patient was encouraged to call the office if there is any further questions. The surgery will be performed at the [facility] on an outpatient basis. -Continue antibiotics for now.  -Blood work ordered.  -Patient encouraged to call the office with any questions, concerns, change in symptoms.   Celesta Gentile, DPM

## 2016-06-30 DIAGNOSIS — M86679 Other chronic osteomyelitis, unspecified ankle and foot: Secondary | ICD-10-CM | POA: Diagnosis not present

## 2016-07-01 ENCOUNTER — Ambulatory Visit: Payer: Medicare Other | Admitting: Podiatry

## 2016-07-02 ENCOUNTER — Telehealth: Payer: Self-pay | Admitting: *Deleted

## 2016-07-02 NOTE — Telephone Encounter (Signed)
Error

## 2016-07-02 NOTE — Telephone Encounter (Signed)
Called patient and left a message to see how the patient was doing since her surgery on 06/30/16 and stated to call the office if any concerns or questions. Lattie Haw

## 2016-07-05 ENCOUNTER — Ambulatory Visit (INDEPENDENT_AMBULATORY_CARE_PROVIDER_SITE_OTHER): Payer: Self-pay | Admitting: Podiatry

## 2016-07-05 ENCOUNTER — Encounter: Payer: Self-pay | Admitting: Podiatry

## 2016-07-05 ENCOUNTER — Ambulatory Visit (INDEPENDENT_AMBULATORY_CARE_PROVIDER_SITE_OTHER): Payer: 59

## 2016-07-05 DIAGNOSIS — M2041 Other hammer toe(s) (acquired), right foot: Secondary | ICD-10-CM | POA: Diagnosis not present

## 2016-07-05 DIAGNOSIS — M86171 Other acute osteomyelitis, right ankle and foot: Secondary | ICD-10-CM

## 2016-07-05 MED ORDER — CLINDAMYCIN HCL 300 MG PO CAPS: 300.0000 mg | ORAL_CAPSULE | Freq: Three times a day (TID) | ORAL | 2 refills | Status: DC

## 2016-07-05 MED ORDER — PROMETHAZINE HCL 25 MG PO TABS: 25.0000 mg | ORAL_TABLET | Freq: Three times a day (TID) | ORAL | 0 refills | Status: DC | PRN

## 2016-07-05 NOTE — Progress Notes (Signed)
Subjective: [name] [name] is a 51 y.o. is seen today in office s/p right 2nd digit partial toe amputaiton preformed on 06/30/16. They state their pain is currently controlled with her pain medication. She gets some nausea but dnies any other symptoms. She is also asking for a refill of the clindamycin as she accidentally washed her prescription. Denies any systemic complaints such as fevers, chills, nausea, vomiting. No calf pain, chest pain, shortness of breath.   Objective: General: No acute distress, AAOx3  DP/PT pulses palpable 2/4, CRT < 3 sec to all digits.  Protective sensation intact. Motor function intact.  Right foot: Incision is well coapted without any evidence of dehiscence and sutures are intact. There is no surrounding erythema, ascending cellulitis, fluctuance, crepitus, malodor, drainage/purulence. There is mild edema around the surgical site. There is mild  pain along the surgical site.  Hammertoes of the 3rd and 4th digit.  No other areas of tenderness to bilateral lower extremities.  No other open lesions or pre-ulcerative lesions.  No pain with calf compression, swelling, warmth, erythema.   Assessment and Plan:  Status post right foot 2nd toe partial amputation, doing well with no complications   -Treatment options discussed including all alternatives, risks, and complications -X-rays were obtained and reviewed with the patient. S/P partial toe amputation. No evidence of acute fracture.  -Antibiotic ointment applied followed by a DSD. Keep clean, dry, intact.  -Finish clndamycin. Refilled today -Phenergan prn for nausea.  -Continue surgical shoe.  -Ice/elevation -Pain medication as needed. -Monitor for any clinical signs or symptoms of infection and DVT/PE and directed to call the office immediately should any occur or go to the ER. -Follow-up in 1 week for possible suture removal or sooner if any problems arise. In the meantime, encouraged to call the office with any  questions, concerns, change in symptoms.   Celesta Gentile, DPM

## 2016-07-12 ENCOUNTER — Ambulatory Visit (INDEPENDENT_AMBULATORY_CARE_PROVIDER_SITE_OTHER): Payer: Self-pay | Admitting: Podiatry

## 2016-07-12 ENCOUNTER — Other Ambulatory Visit: Payer: Self-pay | Admitting: Family Medicine

## 2016-07-12 DIAGNOSIS — M86171 Other acute osteomyelitis, right ankle and foot: Secondary | ICD-10-CM

## 2016-07-12 DIAGNOSIS — Z09 Encounter for follow-up examination after completed treatment for conditions other than malignant neoplasm: Secondary | ICD-10-CM

## 2016-07-12 DIAGNOSIS — M2041 Other hammer toe(s) (acquired), right foot: Secondary | ICD-10-CM

## 2016-07-12 MED ORDER — LUBIPROSTONE 24 MCG PO CAPS: ORAL_CAPSULE | ORAL | 3 refills | Status: DC

## 2016-07-12 NOTE — Progress Notes (Signed)
Subjective: [name] [name] is a 51 y.o. is seen today in office s/p right 2nd digit partial toe amputaiton preformed on 06/30/16. She states that she is doing well. She has continued on antibiotics. She denies any redness or drainage or warmth. Denies any systemic complaints such as fevers, chills, nausea, vomiting. No calf pain, chest pain, shortness of breath.   Objective: General: No acute distress, AAOx3  DP/PT pulses palpable 2/4, CRT < 3 sec to all digits.  Protective sensation intact. Motor function intact.  Right foot: Incision is well coapted without any evidence of dehiscence and sutures are intact. There is no surrounding erythema, ascending cellulitis, fluctuance, crepitus, malodor, drainage/purulence. There is minimal and decreased edema around the surgical site. There is minimal pain along the surgical site.  Hammertoes of the 3rd and 4th digit.  No other areas of tenderness to bilateral lower extremities.  No other open lesions or pre-ulcerative lesions.  No pain with calf compression, swelling, warmth, erythema.   Assessment and Plan:  Status post right foot 2nd toe partial amputation, doing well with no complications   -Treatment options discussed including all alternatives, risks, and complications -There is some mild movement across the incision. Will not remove the suture today. Antibiotic ointment and a bandage applied. Keep clean, dry, intact.  -Finish course of antibiotics  -Continue surgical shoes.  -Elevation -Pain medication as needed. -Monitor for any clinical signs or symptoms of infection and DVT/PE and directed to call the office immediately should any occur or go to the ER. -Follow-up in 1 week for possible suture removal or sooner if any problems arise. In the meantime, encouraged to call the office with any questions, concerns, change in symptoms.   Celesta Gentile, DPM

## 2016-07-13 ENCOUNTER — Encounter: Payer: Self-pay | Admitting: Podiatry

## 2016-07-14 ENCOUNTER — Encounter: Payer: Self-pay | Admitting: Podiatry

## 2016-07-19 ENCOUNTER — Ambulatory Visit (INDEPENDENT_AMBULATORY_CARE_PROVIDER_SITE_OTHER): Payer: 59 | Admitting: Podiatry

## 2016-07-19 DIAGNOSIS — M86171 Other acute osteomyelitis, right ankle and foot: Secondary | ICD-10-CM

## 2016-07-19 DIAGNOSIS — M2041 Other hammer toe(s) (acquired), right foot: Secondary | ICD-10-CM

## 2016-07-21 NOTE — Progress Notes (Signed)
Subjective: [name] [name] is a 51 y.o. is seen today in office s/p right 2nd digit partial toe amputaiton preformed on 06/30/16. She presents the procedure removal. She said that she is doing well she's remain in the surgical shoe. She has completed a course of antibiotics. Denies any systemic complaints such as fevers, chills, nausea, vomiting. No calf pain, chest pain, shortness of breath.   Objective: General: No acute distress, AAOx3  DP/PT pulses palpable 2/4, CRT < 3 sec to all digits.  Protective sensation intact. Motor function intact.  Right foot: Incision is well coapted without any evidence of dehiscence and sutures are intact. There is no surrounding erythema, ascending cellulitis, fluctuance, crepitus, malodor, drainage/purulence. There is minimal  edema around the surgical site. There is no pain along the surgical site.  Hammertoes of the 3rd and 4th digit.  No other areas of tenderness to bilateral lower extremities.  No other open lesions or pre-ulcerative lesions.  No pain with calf compression, swelling, warmth, erythema.   Assessment and Plan:  Status post right foot 2nd toe partial amputation, doing well with no complications   -Treatment options discussed including all alternatives, risks, and complications -Sutures removed today without complications. Incision remained well coapted after removal. Recommend antibiotic ointment dressing changes daily. She consented to shower tomorrow. -She consented return during her shoe next week if able. Have the same problems with the other toes or to the incisions remain the surgical shoe and call the office. I discussed with her prophylactic surgery for the lesser digits as they are contracted to help prevent ulceration of the toes as well. Discussed with her DIPJ arthroplasty with likely K wire fixation to the third and fourth and possibly to the fifth toe as well. We will discuss this further his appointment. -Monitor for any clinical  signs or symptoms of infection and DVT/PE and directed to call the office immediately should any occur or go to the ER. -Follow-up as scheduled or sooner if any problems arise. In the meantime, encouraged to call the office with any questions, concerns, change in symptoms.   Celesta Gentile, DPM

## 2016-08-09 ENCOUNTER — Ambulatory Visit: Payer: 59 | Admitting: Podiatry

## 2016-08-19 ENCOUNTER — Ambulatory Visit (INDEPENDENT_AMBULATORY_CARE_PROVIDER_SITE_OTHER): Payer: 59 | Admitting: Podiatry

## 2016-08-19 DIAGNOSIS — M2041 Other hammer toe(s) (acquired), right foot: Secondary | ICD-10-CM

## 2016-08-19 NOTE — Patient Instructions (Signed)

## 2016-08-23 NOTE — Progress Notes (Signed)
Subjective: 51 year old female presents the also for follow-up evaluation of right second digit amputation. She states this is well-healed she's having no issues. She states that she will get ahead and proceed with surgery to help straighten out her other remaining digits to help prevent any further ulcerations and infections to help prevent amputation of the other toes. Denies any systemic complaints such as fevers, chills, nausea, vomiting. No acute changes since last appointment, and no other complaints at this time.   Objective: AAO x3, NAD DP/PT pulses palpable bilaterally, CRT less than 3 seconds Incision from prior surgical sites on the hallux as well as the amputation second toe are well healed. There is no edema, erythema, increase in warmth. There is no drainage or pus or any clinical signs of infection. 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 These are rigid contractures causing pre-ulcerative areas with blanching erythema to the distal portion of the toes. These are at risk for ulceration. There is no other open lesions or pre-ulcerative lesions. There is no pain with calf compression, swelling, warmth, erythema.  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 Flexion contractures present at the digits -At this time I discussed with her surgical intervention to the lesser digits. I discussed this is not a guarantee resolution of symptoms and she is at risk for nonhealing of the incision as was amputation which she would light to go ahead and proceed with surgery in order to help prevent wounds. She believes that if we don't do this that she will get a wound on the other toes, which I agree  with -I discussed the DIPJ arthroplasty of digits 3 and 4 with PIPJ arthroplasty the fifth digit. Likely wire fixation.  -The incision placement as well as the postoperative course was discussed with the patient. I discussed risks of the surgery which include, but not limited to, infection, bleeding, pain, swelling, need for further surgery, delayed or nonhealing, painful or ugly scar, numbness or sensation changes, over/under correction, recurrence, transfer lesions, further deformity, hardware failure, DVT/PE, loss of toe/foot. Patient understands these risks and wishes to proceed with surgery. The surgical consent was reviewed with the patient all 3 pages were signed. No promises or guarantees were given to the outcome of the procedure. All questions were answered to the best of my ability. Before the surgery the patient was encouraged to call the office if there is any further questions. The surgery will be performed at the [facility] on an outpatient basis.  Celesta Gentile, DPM

## 2016-09-08 DIAGNOSIS — M2041 Other hammer toe(s) (acquired), right foot: Secondary | ICD-10-CM | POA: Diagnosis not present

## 2016-09-15 ENCOUNTER — Other Ambulatory Visit: Payer: Self-pay | Admitting: Podiatry

## 2016-09-16 ENCOUNTER — Ambulatory Visit (INDEPENDENT_AMBULATORY_CARE_PROVIDER_SITE_OTHER): Payer: Self-pay | Admitting: Podiatry

## 2016-09-16 ENCOUNTER — Ambulatory Visit (INDEPENDENT_AMBULATORY_CARE_PROVIDER_SITE_OTHER): Payer: 59

## 2016-09-16 DIAGNOSIS — Z9889 Other specified postprocedural states: Secondary | ICD-10-CM

## 2016-09-16 DIAGNOSIS — M2041 Other hammer toe(s) (acquired), right foot: Secondary | ICD-10-CM

## 2016-09-17 NOTE — Progress Notes (Signed)
Subjective: [name] [name] is a 51 y.o. is seen today in office s/p right 3rd-5th hammertoe repair preformed on 09/08/2016. She states that her pain is currently controlled. She is continuing in the surgical boot. She says overall she is doing well she has no concerns other than her 3 kids are in jail and she is concerned about that. Denies any systemic complaints such as fevers, chills, nausea, vomiting. No calf pain, chest pain, shortness of breath.   Objective: General: No acute distress, AAOx3  DP/PT pulses palpable 2/4, CRT < 3 sec to all digits.  Protective sensation intact. Motor function intact.  Right foot: Incision is well coapted without any evidence of dehiscence and sutures are intact. There is no surrounding erythema, ascending cellulitis, fluctuance, crepitus, malodor, drainage/purulence. There is minimal edema around the surgical site. There is mild pain along the surgical site. K wires intact the third and fourth digits without any drainage or pus. No other areas of tenderness to bilateral lower extremities.  No other open lesions or pre-ulcerative lesions. 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 No pain with calf compression, swelling, warmth, erythema.   Assessment and Plan:  Status post right foot surgery, doing well with no complications   -Treatment options discussed including all alternatives, risks, and complications -X-rays were obtained and reviewed with the patient. K wire intact the third and fourth toe. No evidence of acute fracture. Previsit surgery to the hallux IPJ. -In the buttock ointment and a bandage was applied. Keep clean, dry, intact. -Continue cam boot. -Ice/elevation -Pain medication as needed. She has this from pain management  -Monitor for any clinical signs or symptoms of infection and DVT/PE and directed to call the office immediately should any occur or go to the ER. -Follow-up in 1 week for POSSIBLE  suture removal or sooner if any problems arise. In the meantime, encouraged to call the office with any questions, concerns, change in symptoms.   Celesta Gentile, DPM

## 2016-09-21 ENCOUNTER — Encounter: Payer: Self-pay | Admitting: Podiatry

## 2016-09-21 ENCOUNTER — Other Ambulatory Visit: Payer: Self-pay | Admitting: Pain Medicine

## 2016-09-21 DIAGNOSIS — M545 Low back pain, unspecified: Secondary | ICD-10-CM

## 2016-09-21 NOTE — Progress Notes (Signed)
Right Second (2nd) toe amputation

## 2016-09-21 NOTE — Progress Notes (Signed)
Right foot hammertoe repair of toes 3, 4, 5 with wire fixation of 3, 4

## 2016-09-21 NOTE — Progress Notes (Unsigned)
° °  Subjective:    Patient ID: [name] [name], female    DOB: 12/18/1965, 51 y.o.   MRN: [id number]  HPI    Review of Systems     Objective:   Physical Exam        Assessment & Plan:

## 2016-09-23 ENCOUNTER — Ambulatory Visit (INDEPENDENT_AMBULATORY_CARE_PROVIDER_SITE_OTHER): Payer: 59 | Admitting: Podiatry

## 2016-09-23 ENCOUNTER — Emergency Department (HOSPITAL_COMMUNITY)
Admission: EM | Admit: 2016-09-23 | Discharge: 2016-09-23 | Disposition: A | Discharge status: Home / Self Care | Payer: Medicare Other | Attending: Emergency Medicine | Admitting: Emergency Medicine

## 2016-09-23 ENCOUNTER — Other Ambulatory Visit: Payer: Self-pay

## 2016-09-23 ENCOUNTER — Emergency Department (HOSPITAL_COMMUNITY): Payer: Medicare Other

## 2016-09-23 ENCOUNTER — Encounter (HOSPITAL_COMMUNITY): Payer: Self-pay | Admitting: Emergency Medicine

## 2016-09-23 ENCOUNTER — Encounter: Payer: Self-pay | Admitting: Podiatry

## 2016-09-23 DIAGNOSIS — R110 Nausea: Secondary | ICD-10-CM | POA: Insufficient documentation

## 2016-09-23 DIAGNOSIS — M48061 Spinal stenosis, lumbar region without neurogenic claudication: Secondary | ICD-10-CM | POA: Insufficient documentation

## 2016-09-23 DIAGNOSIS — F419 Anxiety disorder, unspecified: Secondary | ICD-10-CM | POA: Diagnosis not present

## 2016-09-23 DIAGNOSIS — F3189 Other bipolar disorder: Secondary | ICD-10-CM | POA: Insufficient documentation

## 2016-09-23 DIAGNOSIS — R079 Chest pain, unspecified: Secondary | ICD-10-CM | POA: Insufficient documentation

## 2016-09-23 DIAGNOSIS — G959 Disease of spinal cord, unspecified: Secondary | ICD-10-CM | POA: Insufficient documentation

## 2016-09-23 DIAGNOSIS — R0789 Other chest pain: Secondary | ICD-10-CM

## 2016-09-23 DIAGNOSIS — M2041 Other hammer toe(s) (acquired), right foot: Secondary | ICD-10-CM

## 2016-09-23 DIAGNOSIS — R0602 Shortness of breath: Secondary | ICD-10-CM

## 2016-09-23 DIAGNOSIS — R42 Dizziness and giddiness: Secondary | ICD-10-CM | POA: Insufficient documentation

## 2016-09-23 DIAGNOSIS — Z79899 Other long term (current) drug therapy: Secondary | ICD-10-CM | POA: Diagnosis not present

## 2016-09-23 DIAGNOSIS — J45909 Unspecified asthma, uncomplicated: Secondary | ICD-10-CM | POA: Diagnosis not present

## 2016-09-23 DIAGNOSIS — M5137 Other intervertebral disc degeneration, lumbosacral region: Secondary | ICD-10-CM | POA: Insufficient documentation

## 2016-09-23 DIAGNOSIS — G43909 Migraine, unspecified, not intractable, without status migrainosus: Secondary | ICD-10-CM | POA: Insufficient documentation

## 2016-09-23 DIAGNOSIS — M961 Postlaminectomy syndrome, not elsewhere classified: Secondary | ICD-10-CM | POA: Insufficient documentation

## 2016-09-23 DIAGNOSIS — F3131 Bipolar disorder, current episode depressed, mild: Secondary | ICD-10-CM | POA: Insufficient documentation

## 2016-09-23 DIAGNOSIS — K5900 Constipation, unspecified: Secondary | ICD-10-CM | POA: Insufficient documentation

## 2016-09-23 DIAGNOSIS — R109 Unspecified abdominal pain: Secondary | ICD-10-CM | POA: Insufficient documentation

## 2016-09-23 DIAGNOSIS — L600 Ingrowing nail: Secondary | ICD-10-CM | POA: Diagnosis not present

## 2016-09-23 DIAGNOSIS — I10 Essential (primary) hypertension: Secondary | ICD-10-CM | POA: Insufficient documentation

## 2016-09-23 DIAGNOSIS — R569 Unspecified convulsions: Secondary | ICD-10-CM | POA: Insufficient documentation

## 2016-09-23 DIAGNOSIS — M51379 Other intervertebral disc degeneration, lumbosacral region without mention of lumbar back pain or lower extremity pain: Secondary | ICD-10-CM | POA: Insufficient documentation

## 2016-09-23 DIAGNOSIS — G4730 Sleep apnea, unspecified: Secondary | ICD-10-CM | POA: Insufficient documentation

## 2016-09-23 DIAGNOSIS — S32009K Unspecified fracture of unspecified lumbar vertebra, subsequent encounter for fracture with nonunion: Secondary | ICD-10-CM | POA: Insufficient documentation

## 2016-09-23 HISTORY — DX: Shortness of breath: R06.02

## 2016-09-23 LAB — BASIC METABOLIC PANEL
Anion gap: 12 (ref 5–15)
BUN: 5 mg/dL — ABNORMAL LOW (ref 6–20)
CO2: 22 mmol/L (ref 22–32)
Calcium: 9 mg/dL (ref 8.9–10.3)
Chloride: 99 mmol/L — ABNORMAL LOW (ref 101–111)
Creatinine, Ser: 0.67 mg/dL (ref 0.44–1.00)
GFR calc Af Amer: >60 mL/min (ref >60)
GFR calc non Af Amer: >60 mL/min (ref >60)
Glucose, Bld: 113 mg/dL — ABNORMAL HIGH (ref 65–99)
Potassium: 3.4 mmol/L — ABNORMAL LOW (ref 3.5–5.1)
Sodium: 133 mmol/L — ABNORMAL LOW (ref 135–145)

## 2016-09-23 LAB — I-STAT TROPONIN, ED
Troponin i, poc: 0 ng/mL (ref 0.00–0.08)
Troponin i, poc: 0 ng/mL (ref 0.00–0.08)

## 2016-09-23 LAB — CBC
HCT: 39.1 % (ref 36.0–46.0)
Hemoglobin: 13 g/dL (ref 12.0–15.0)
MCH: 30.4 pg (ref 26.0–34.0)
MCHC: 33.2 g/dL (ref 30.0–36.0)
MCV: 91.6 fL (ref 78.0–100.0)
Platelets: 278 10*3/uL (ref 150–400)
RBC: 4.27 MIL/uL (ref 3.87–5.11)
RDW: 12.2 % (ref 11.5–15.5)
WBC: 7.3 10*3/uL (ref 4.0–10.5)

## 2016-09-23 MED ORDER — PROMETHAZINE HCL 25 MG PO TABS: 25.0000 mg | ORAL_TABLET | Freq: Three times a day (TID) | ORAL | 0 refills | Status: DC | PRN

## 2016-09-23 MED ORDER — NAPROXEN 250 MG PO TABS
375.0000 mg | ORAL_TABLET | Freq: Once | ORAL | Status: AC
  Administered 2016-09-23: 375 mg via ORAL
  Filled 2016-09-23: qty 2

## 2016-09-23 MED ORDER — SULFAMETHOXAZOLE-TRIMETHOPRIM 800-160 MG PO TABS: 1.0000 | ORAL_TABLET | Freq: Two times a day (BID) | ORAL | 0 refills | Status: DC

## 2016-09-23 MED ORDER — IOPAMIDOL (ISOVUE-370) INJECTION 76%
INTRAVENOUS | Status: AC
  Administered 2016-09-23: 100 mL
  Filled 2016-09-23: qty 100

## 2016-09-23 MED ORDER — POTASSIUM CHLORIDE CRYS ER 20 MEQ PO TBCR
40.0000 meq | EXTENDED_RELEASE_TABLET | Freq: Once | ORAL | Status: AC
  Administered 2016-09-23: 40 meq via ORAL
  Filled 2016-09-23: qty 2

## 2016-09-23 NOTE — ED Triage Notes (Signed)
Pt BIB EMS from home. Began having chest pressure about 1 hour prior just below her L breast. Per EMS, pain complaint fluxuating. No radiation of pain or SOB. Mild nausea reported with mild dizziness upon standing. No prior cardiac hx.

## 2016-09-23 NOTE — Patient Instructions (Signed)

## 2016-09-23 NOTE — ED Notes (Signed)
Pt transported to CT ?

## 2016-09-23 NOTE — ED Provider Notes (Signed)
[facility] DEPT Provider Note   CSN: [id number] Arrival date & time: 09/23/16  0011   By signing my name below, I, Eunice Blase, attest that this documentation has been prepared under the direction and in the presence of Catlyn Shipton, Gwenyth Allegra, MD. Electronically signed, Eunice Blase, ED Scribe. 09/23/16. 1:29 AM.  History   Chief Complaint Chief Complaint  Patient presents with  . Chest Pain   The history is provided by the patient and medical records. No language interpreter was used.    [name] [name] is a 51 y.o. female with h/o HTN presenting to the Emergency Department with chief complaint of sudden onset pressure like L chest pain onset ~1 hour prior to evaluation. Pain described as fluctuating, 1/10, aching pain in triage worse with deep breathing. She notes nausea and dizziness on standing. Pt given nitroglycerine prior to evaluation with moderate relief. She states she has been prescribed nitroglycerine in the past for similar symptoms without relief. Surgery noted on L 3rd, 4th and 5th toes on 09/21/2016 with Dr. Jacqualyn Posey of podiatry. She notes 1 episode of less severe chest pains radiating into the jaw and mildly heightened blood pressure in the past. No pain radiation or SOB noted. No other complaints at this time.   Past Medical History:  Diagnosis Date  . Anxiety   . Asthma   . Bipolar 1 disorder ([place])    ect treatments last treatment Sep 02 1011  . Depression   . GERD (gastroesophageal reflux disease)   . Hypertension   . Seizures ([place])    last seizure was so long ago she can't remember.   . Sleep apnea     Patient Active Problem List   Diagnosis Date Noted  . 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 Chronic pain syndrome   . Esophageal reflux   . Depression   . Rash 11/14/2015  . Cellulitis of great toe of right foot 08/12/2015  . Toe ulcer, right ([place]) 04/11/2015  . Cellulitis of toe of right foot 04/11/2015  . Nonspecific chest pain   . Chest pain 10/26/2014    . Morbid obesity due to excess calories ([place]) 10/26/2014  . Hypokalemia 10/26/2014  . Manic bipolar I disorder ([place]) 10/26/2014  . 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 Manic bipolar I disorder with rapid cycling ([place]) 05/18/2011    Class: Acute    Past Surgical History:  Procedure Laterality Date  . ABDOMINAL HYSTERECTOMY    . BACK SURGERY    . CARPAL TUNNEL RELEASE     x2  . Laproscopic knee surgery    . PLANTAR FASCIA RELEASE     x2    OB History    No data available       Home Medications    Prior to Admission medications   Medication Sig Start Date End Date Taking? Authorizing Provider  albuterol (PROAIR HFA) 108 (90 Base) MCG/ACT inhaler INHALE 2 PUFFS INTO THE LUNGS EVERY 4 HOURS AS NEEDED FOR WHEEZING 06/21/16  Yes [name], MD  busPIRone (BUSPAR) 5 MG tablet Take 5 mg by mouth 3 (three) times daily.   Yes [provider]  Calcium Carbonate-Vit D-Min (CALCIUM 1200 PO) Take 1 tablet by mouth daily.   Yes [provider]  citalopram (CELEXA) 40 MG tablet Take 40 mg by mouth at bedtime.  05/28/11  Yes Darrol Jump, MD  cyclobenzaprine (FLEXERIL) 10 MG tablet Take 10 mg by mouth 3 (three) times daily as needed for muscle spasms.  Yes [provider]  fluticasone (FLONASE) 50 MCG/ACT nasal spray Place 2 sprays into both nostrils daily. Patient taking differently: Place 2 sprays into both nostrils daily as needed for allergies.  04/05/16  Yes [name], MD  gabapentin (NEURONTIN) 600 MG tablet Take 1 tablet (600 mg total) by mouth 3 (three) times daily. For anxiety and pain management. May have to stop to take ECT. 05/28/11  Yes Darrol Jump, MD  haloperidol (HALDOL) 5 MG tablet Take 1 tablet (5 mg total) by mouth at bedtime. For psychosis 11/15/15  Yes Barton Dubois, MD  hydrochlorothiazide (HYDRODIURIL) 25 MG tablet TAKE 1 TABLET EVERY DAY 06/21/16  Yes [name], MD  lubiprostone (AMITIZA) 24 MCG capsule TAKE 1 CAPSULE  TWICE A DAY WITH A MEAL 07/12/16  Yes [name], MD  morphine (MS CONTIN) 30 MG 12 hr tablet Take 30 mg by mouth every 12 (twelve) hours.  01/07/16  Yes [provider]  Multiple Vitamins-Minerals (MULTIVITAMIN WITH MINERALS) tablet Take 1 tablet by mouth daily.   Yes [provider]  nitroGLYCERIN (NITROSTAT) 0.4 MG SL tablet Place 1 tablet (0.4 mg total) under the tongue every 5 (five) minutes as needed for chest pain. 10/27/14  Yes Leone Brand, MD  oxyCODONE-acetaminophen (PERCOCET/ROXICET) 5-325 MG tablet Take 1 tablet by mouth every 4 (four) hours as needed for severe pain.    Yes [name], DPM  pantoprazole (PROTONIX) 40 MG tablet TAKE 1 TABLET BY MOUTH TWICE A DAY 02/18/16  Yes [name], MD  potassium chloride SA (KLOR-CON M20) 20 MEQ tablet Take 2 tablets (40 mEq total) by mouth daily. Patient taking differently: Take 20 mEq by mouth 2 (two) times daily.  02/04/16  Yes [name], MD  prazosin (MINIPRESS) 5 MG capsule Take 5 mg by mouth at bedtime.   Yes [provider]  promethazine (PHENERGAN) 25 MG tablet Take 1 tablet (25 mg total) by mouth every 8 (eight) hours as needed for nausea or vomiting. 07/05/16  Yes [name], DPM  SUMAtriptan (IMITREX) 100 MG tablet TAKE 1 TABLET (100 MG TOTAL) BY MOUTH EVERY 2 (TWO) HOURS AS NEEDED. MIGRANES 05/27/14  Yes [name], MD  traZODone (DESYREL) 100 MG tablet Take 100 mg by mouth at bedtime. 07/29/14  Yes [provider]    Family History Family History  Problem Relation Age of Onset  . Diabetes Mother   . COPD Mother   . Hypertension Mother   . Hyperlipidemia Mother   . Heart disease Father   . Hyperlipidemia Father   . Hypertension Father   . Cancer Father   . Heart disease Brother   . Heart disease Daughter     Social History Social History  Substance Use Topics  . Smoking status: Never Smoker  . Smokeless tobacco: Never Used  . Alcohol use No      Allergies   Tetracyclines & related; Tramadol; Ciprofloxacin; Codeine; and Penicillins   Review of Systems Review of Systems  Cardiovascular: Positive for chest pain.  All other systems reviewed and are negative.    Physical Exam Updated Vital Signs BP 111/75 (BP Location: Right Arm)   Pulse 93   Temp 98.2 F (36.8 C) (Oral)   Resp 13   Ht 5\' 4"  (1.626 m)   Wt 232 lb (105.2 kg)   SpO2 98%   BMI 39.82 kg/m   Physical Exam  Constitutional: She is oriented to person, place, and  time. She appears well-developed and well-nourished. No distress.  HENT:  Head: Normocephalic and atraumatic.  Right Ear: Hearing normal.  Left Ear: Hearing normal.  Nose: Nose normal.  Mouth/Throat: Oropharynx is clear and moist and mucous membranes are normal.  Eyes: Conjunctivae and EOM are normal. Pupils are equal, round, and reactive to light.  Neck: Normal range of motion. Neck supple.  Cardiovascular: Regular rhythm, S1 normal and S2 normal.  Exam reveals no gallop and no friction rub.   No murmur heard. Pulmonary/Chest: Effort normal and breath sounds normal. No respiratory distress. She exhibits no tenderness.  Abdominal: Soft. Normal appearance and bowel sounds are normal. There is no hepatosplenomegaly. There is no tenderness. There is no rebound, no guarding, no tenderness at McBurney's point and negative Murphy's sign. No hernia.  Musculoskeletal: Normal range of motion.  Neurological: She is alert and oriented to person, place, and time. She has normal strength. No cranial nerve deficit or sensory deficit. Coordination normal. GCS eye subscore is 4. GCS verbal subscore is 5. GCS motor subscore is 6.  Skin: Skin is warm, dry and intact. No rash noted. No cyanosis.  Psychiatric: She has a normal mood and affect. Her speech is normal and behavior is normal. Thought content normal.  Nursing note and vitals reviewed.    ED Treatments / Results  DIAGNOSTIC STUDIES: Oxygen Saturation  is 98% on RA, NL by my interpretation.    COORDINATION OF CARE: 1:01 AM-Discussed next steps with pt. Pt verbalized understanding and is agreeable with the plan.   Labs (all labs ordered are listed, but only abnormal results are displayed) Labs Reviewed  BASIC METABOLIC PANEL - Abnormal; Notable for the following:       Result Value   Sodium 133 (*)    Potassium 3.4 (*)    Chloride 99 (*)    Glucose, Bld 113 (*)    BUN 5 (*)    All other components within normal limits  CBC  I-STAT TROPOININ, ED  Randolm Idol, ED    EKG  EKG Interpretation None       Radiology Dg Chest 2 View  Result Date: 09/23/2016 CLINICAL DATA:  Left-sided chest pain tonight. EXAM: CHEST  2 VIEW COMPARISON:  Radiograph 10/26/2014, CT 11/14/2015 FINDINGS: The cardiomediastinal contours are normal. The lungs are clear. Pulmonary vasculature is normal. No consolidation, pleural effusion, or pneumothorax. No acute osseous abnormalities are seen. Posterior rod and intrapedicular screw fusion of the thoracolumbar spine. IMPRESSION: No acute abnormality. Electronically Signed   By: Jeb Levering M.D.   On: 09/23/2016 01:36   Ct Angio Chest Pe W Or Wo Contrast  Result Date: 09/23/2016 CLINICAL DATA:  Chest pain. EXAM: CT ANGIOGRAPHY CHEST WITH CONTRAST TECHNIQUE: Multidetector CT imaging of the chest was performed using the standard protocol during bolus administration of intravenous contrast. Multiplanar CT image reconstructions and MIPs were obtained to evaluate the vascular anatomy. CONTRAST:  80 cc Isovue 370 IV COMPARISON:  Radiographs earlier this day. Chest CT PE protocol 11/14/2015 FINDINGS: Cardiovascular: There are no filling defects within the pulmonary arteries to suggest pulmonary embolus. No evidence of aortic dissection. Mild multi chamber cardiomegaly. No pericardial effusion. Mediastinum/Nodes: No mediastinal, hilar, or axillary adenopathy. Tiny hiatal hernia. Visualized thyroid gland is normal.  Lungs/Pleura: No consolidation, pulmonary edema or pleural fluid. Minimal subsegmental atelectasis at the lung bases. Upper Abdomen: No acute abnormality. Musculoskeletal: Posterior rod and intrapedicular screw fusion. Unchanged compression fracture T9. There are no acute or suspicious osseous abnormalities. Review of  the MIP images confirms the above findings. IMPRESSION: No pulmonary embolus or acute intrathoracic abnormality. Electronically Signed   By: Jeb Levering M.D.   On: 09/23/2016 02:54    Procedures Procedures (including critical care time)  Medications Ordered in ED Medications  potassium chloride SA (K-DUR,KLOR-CON) CR tablet 40 mEq (40 mEq Oral Given 09/23/16 0148)  iopamidol (ISOVUE-370) 76 % injection (100 mLs  Contrast Given 09/23/16 0204)  naproxen (NAPROSYN) tablet 375 mg (375 mg Oral Given 09/23/16 0539)     Initial Impression / Assessment and Plan / ED Course  I have reviewed the triage vital signs and the nursing notes.  Pertinent labs & imaging results that were available during my care of the patient were reviewed by me and considered in my medical decision making (see chart for details).    Patient presents to the ER for evaluation of chest pain. Pain started while she was at rest approximately an hour before arrival. No shortness of breath but there is increased pain with breathing. Pain has been fluctuating without identified alleviating factors. She does not have a cardiac history. Symptoms are atypical for cardiac etiology. She has had an EKG that is unremarkable and troponin has been negative 2. Patient did have a recent surgery, therefore PE was considered. CT angiography performed, no acute pathology noted. Patient is felt to be low risk for acute coronary syndrome or cardiac etiology, appropriate for discharge and outpatient follow-up.   Final Clinical Impressions(s) / ED Diagnoses   Final diagnoses:  Chest pain  Other chest pain    New Prescriptions New  Prescriptions   No medications on file  I personally performed the services described in this documentation, which was scribed in my presence. The recorded information has been reviewed and is accurate.    Orpah Greek, MD 09/23/16 [phone]

## 2016-09-26 NOTE — Progress Notes (Signed)
Subjective: [name] [name] is a 51 y.o. is seen today in office s/p right 3rd-5th hammertoe repair preformed on 09/08/2016. She states that her pain is currently controlled. She presents a procedure removal. However say she does have new concerns of ingrown toenail to the right big toe. She states that she has been prone to ingrown toenails previously. si she states this has started nce last appointment. She has noticed some clear drainage coming to the nail border points the medial aspect. She denies any red streaks. she has no other concerns today. Denies any systemic complaints such as fevers, chills, nausea, vomiting. No calf pain, chest pain, shortness of breath.   Objective: General: No acute distress, AAOx3  DP/PT pulses palpable 2/4, CRT < 3 sec to all digits.  Protective sensation intact. Motor function intact.  Right foot: Incision is well coapted without any evidence of dehiscence and sutures are intact. There is no surrounding erythema, ascending cellulitis, fluctuance, crepitus, malodor, drainage/purulence. There is minimal edema around the surgical site. There is minimal pain along the surgical site and this is improved. K wires intact the third and fourth digits without any drainage or pus. Toes are in a rectus position. Along the medial aspect of the right hallux toenails incurvation of the nail with serosanguineous drainage expressed the nail border. There is localized edema and erythema to the nail border on the medial aspect there is no ascending cellulitis. There is dried blood and granulation tissue present within the nail border.  No other areas of tenderness to bilateral lower extremities.  No other open lesions or pre-ulcerative lesions. 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 No pain with calf compression, swelling, warmth, erythema.   Assessment and Plan:  Status post right foot surgery, doing well with no complications;  ingrown  toenail right hallux medial nail border.   -Treatment options discussed including all alternatives, risks, and complications -Sutures removed today without any complications and the incision remained coapted after removal. Antibiotic ointment and a Band-Aid was applied. -Continue with surgical shoe.  -Elevation -At this time, recommended partial nail removal without chemical matricectomy to the right medial hallux due to infection. Risks and complications were discussed with the patient for which they understand and  verbally consent to the procedure. Under sterile conditions a total of 3 mL of a mixture of 2% lidocaine plain and 0.5% Marcaine plain was infiltrated in a hallux block fashion. Once anesthetized, the skin was prepped in sterile fashion. A tourniquet was then applied. Next the medial border of the hallux nail border was sharply excised making sure to remove the entire offending nail border. Once the nail was  Removed, the area was debrided and the underlying skin was intact. The area was irrigated and hemostasis was obtained.  A dry sterile dressing was applied. After application of the dressing the tourniquet was removed and there is found to be an immediate capillary refill time to the digit. The patient tolerated the procedure well any complications. Post procedure instructions were discussed the patient for which he verbally understood. Follow-up in one week for nail check or sooner if any problems are to arise. Discussed signs/symptoms of worsening infection and directed to call the office immediately should any occur or go directly to the emergency room. In the meantime, encouraged to call the office with any questions, concerns, changes symptoms. Do not soak the foot. Clean with antibacterial soap.  -Bactrim -RTC 1 week or sooner if needed.  Celesta Gentile, DPM

## 2016-09-28 ENCOUNTER — Ambulatory Visit (INDEPENDENT_AMBULATORY_CARE_PROVIDER_SITE_OTHER): Payer: 59 | Admitting: Cardiovascular Disease

## 2016-09-28 ENCOUNTER — Encounter: Payer: Self-pay | Admitting: Cardiovascular Disease

## 2016-09-28 VITALS — BP 120/78 | HR 99 | Ht 63.0 in | Wt 234.0 lb

## 2016-09-28 DIAGNOSIS — I10 Essential (primary) hypertension: Secondary | ICD-10-CM | POA: Diagnosis not present

## 2016-09-28 DIAGNOSIS — I200 Unstable angina: Secondary | ICD-10-CM

## 2016-09-28 DIAGNOSIS — R079 Chest pain, unspecified: Secondary | ICD-10-CM | POA: Diagnosis not present

## 2016-09-28 NOTE — Patient Instructions (Signed)
Medication Instructions: Your physician recommends that you continue on your current medications as directed. Please refer to the Current Medication list given to you today.  Testing/Procedures: Your physician has requested that you have a lexiscan myoview. For further information please visit [email or web address]. Please follow instruction sheet, as given.  Your physician has recommended that you wear a 30 day event monitor. Event monitors are medical devices that record the heart's electrical activity. Doctors most often [place] these monitors to diagnose arrhythmias. Arrhythmias are problems with the speed or rhythm of the heartbeat. The monitor is a small, portable device. You can wear one while you do your normal daily activities. This is usually used to diagnose what is causing palpitations/syncope (passing out).  Follow-Up: Your physician recommends that you schedule a follow-up appointment after testing with Dr. [name].  If you need a refill on your cardiac medications before your next appointment, please call your pharmacy.

## 2016-09-28 NOTE — Assessment & Plan Note (Signed)
[name] [name] was recently seen in the emergency room on 09/23/16 with atypical chest pain. She's had these episodes for years but they have been more frequent recently. They're brought on by tachypalpitations which developed the operative chest pain and presyncope. Twelve-lead EKG was unremarkable. Enzymes were normal. Her risk factors include treated hypertension and family history. I'm going to get a Myoview stress test to risk stratify her. Because she cannot exercise will be pharmacologic.

## 2016-09-28 NOTE — Assessment & Plan Note (Signed)
History of essential hypertension blood pressure is 120/78. She is on hydrochlorothiazide. Continue current meds at current dosing

## 2016-09-28 NOTE — Progress Notes (Signed)
09/28/2016 [name] [name]   [birth year]  [id number]  Primary Physician Pickard, Cammie Mcgee, MD Primary Cardiologist: [name] MD Renae Gloss  HPI:  [name] [name] is a 51 year old mildly overweight separated Caucasian female mother of 3 children, grandmother of 2 grandchildren who currently does not work. Her primary care provider is [name] [name] . She was referred by the emergency room because of recent chest pain evaluation. Risk factors include treated hypertension and family history. She's never had a heart attack or stroke. She's had chest pain off and on for years more frequent recently. In the ER her EKG showed no acute changes. Enzymes are negative. The pain is brought on by tachypalpitations resulting in chest pain radiating to her jaw with presyncope.   Current Outpatient Prescriptions  Medication Sig Dispense Refill  . albuterol (PROAIR HFA) 108 (90 Base) MCG/ACT inhaler INHALE 2 PUFFS INTO THE LUNGS EVERY 4 HOURS AS NEEDED FOR WHEEZING 8.5 Inhaler 3  . busPIRone (BUSPAR) 5 MG tablet Take 5 mg by mouth 3 (three) times daily.    . Calcium Carbonate-Vit D-Min (CALCIUM 1200 PO) Take 1 tablet by mouth daily.    . citalopram (CELEXA) 40 MG tablet Take 40 mg by mouth at bedtime.     . cyclobenzaprine (FLEXERIL) 10 MG tablet Take 10 mg by mouth 3 (three) times daily as needed for muscle spasms.    . fluticasone (FLONASE) 50 MCG/ACT nasal spray Place 2 sprays into both nostrils daily. (Patient taking differently: Place 2 sprays into both nostrils daily as needed for allergies. ) 48 g 2  . gabapentin (NEURONTIN) 600 MG tablet Take 1 tablet (600 mg total) by mouth 3 (three) times daily. For anxiety and pain management. May have to stop to take ECT. 90 tablet 0  . haloperidol (HALDOL) 5 MG tablet Take 1 tablet (5 mg total) by mouth at bedtime. For psychosis    . hydrochlorothiazide (HYDRODIURIL) 25 MG tablet TAKE 1 TABLET EVERY DAY 90 tablet 3  . lubiprostone (AMITIZA) 24  MCG capsule TAKE 1 CAPSULE TWICE A DAY WITH A MEAL 180 capsule 3  . morphine (MS CONTIN) 30 MG 12 hr tablet Take 30 mg by mouth every 12 (twelve) hours.     . Multiple Vitamins-Minerals (MULTIVITAMIN WITH MINERALS) tablet Take 1 tablet by mouth daily.    . nitroGLYCERIN (NITROSTAT) 0.4 MG SL tablet Place 1 tablet (0.4 mg total) under the tongue every 5 (five) minutes as needed for chest pain. 30 tablet 0  . oxyCODONE-acetaminophen (PERCOCET/ROXICET) 5-325 MG tablet Take 1 tablet by mouth every 4 (four) hours as needed for severe pain.     . pantoprazole (PROTONIX) 40 MG tablet TAKE 1 TABLET BY MOUTH TWICE A DAY 180 tablet 2  . potassium chloride SA (KLOR-CON M20) 20 MEQ tablet Take 2 tablets (40 mEq total) by mouth daily. (Patient taking differently: Take 20 mEq by mouth 2 (two) times daily. ) 180 tablet 3  . prazosin (MINIPRESS) 5 MG capsule Take 5 mg by mouth at bedtime.    . promethazine (PHENERGAN) 25 MG tablet Take 1 tablet (25 mg total) by mouth every 8 (eight) hours as needed for nausea or vomiting. 20 tablet 0  . sulfamethoxazole-trimethoprim (BACTRIM DS,SEPTRA DS) 800-160 MG tablet Take 1 tablet by mouth 2 (two) times daily. 20 tablet 0  . SUMAtriptan (IMITREX) 100 MG tablet TAKE 1 TABLET (100 MG TOTAL) BY MOUTH EVERY 2 (TWO) HOURS AS NEEDED. MIGRANES 10 tablet 2  .  traZODone (DESYREL) 100 MG tablet Take 100 mg by mouth at bedtime.  0   No current facility-administered medications for this visit.     Allergies  Allergen Reactions  . Tetracyclines & Related Other (See Comments)    syncope  . Tramadol Other (See Comments)    seizures  . Ciprofloxacin Rash  . Codeine Itching  . Penicillins Hives and Rash    Social History   Social History  . Marital status: Legally Separated    Spouse name: N/A  . Number of children: 3  . Years of education: 12   Occupational History  . Disabled    Social History Main Topics  . Smoking status: Never Smoker  . Smokeless tobacco: Never  Used  . Alcohol use No  . Drug use: No  . Sexual activity: No   Other Topics Concern  . Not on file   Social History Narrative   Patient drink 4 16oz caffeine drinks a day      Review of Systems: General: negative for chills, fever, night sweats or weight changes.  Cardiovascular: negative for chest pain, dyspnea on exertion, edema, orthopnea, palpitations, paroxysmal nocturnal dyspnea or shortness of breath Dermatological: negative for rash Respiratory: negative for cough or wheezing Urologic: negative for hematuria Abdominal: negative for nausea, vomiting, diarrhea, bright red blood per rectum, melena, or hematemesis Neurologic: negative for visual changes, syncope, or dizziness All other systems reviewed and are otherwise negative except as noted above.    Blood pressure 120/78, pulse 99, height 5\' 3"  (1.6 m), weight 234 lb (106.1 kg).  General appearance: alert and no distress Neck: no adenopathy, no carotid bruit, no JVD, supple, symmetrical, trachea midline and thyroid not enlarged, symmetric, no tenderness/mass/nodules Lungs: clear to auscultation bilaterally Heart: regular rate and rhythm, S1, S2 normal, no murmur, click, rub or gallop Extremities: extremities normal, atraumatic, no cyanosis or edema  EKG not performed today  ASSESSMENT AND PLAN:   Chest pain [name] was recently seen in the emergency room on 09/23/16 with atypical chest pain. She's had these episodes for years but they have been more frequent recently. They're brought on by tachypalpitations which developed the operative chest pain and presyncope. Twelve-lead EKG was unremarkable. Enzymes were normal. Her risk factors include treated hypertension and family history. I'm going to get a Myoview stress test to risk stratify her. Because she cannot exercise will be pharmacologic.  Hypertension History of essential hypertension blood pressure is 120/78. She is on hydrochlorothiazide. Continue current meds  at current dosing      [name] MD [facility], [facility] 09/28/2016 4:30 PM

## 2016-09-30 ENCOUNTER — Ambulatory Visit (INDEPENDENT_AMBULATORY_CARE_PROVIDER_SITE_OTHER): Payer: 59 | Admitting: Podiatry

## 2016-09-30 ENCOUNTER — Encounter: Payer: Self-pay | Admitting: Podiatry

## 2016-09-30 DIAGNOSIS — Z9889 Other specified postprocedural states: Secondary | ICD-10-CM

## 2016-09-30 DIAGNOSIS — M2041 Other hammer toe(s) (acquired), right foot: Secondary | ICD-10-CM

## 2016-09-30 DIAGNOSIS — L600 Ingrowing nail: Secondary | ICD-10-CM

## 2016-10-03 NOTE — Progress Notes (Signed)
Subjective: 51 year old female presents the office they for follow up evaluation of ingrown toenail, infection to the right big toe. She's been cleaning with antibacterial soap Neosporin and a bandage on the toe and she feels this is getting better. She denies any drainage or pus. The redness has improved. She does have the K wire to the third toes about the, out completely. The K wires fourth toes intact. Her pain is controlled. She denies any drainage or pus coming from the K wire sites. She is continued surgical boot. Denies any systemic complaints such as fevers, chills, nausea, vomiting. No acute changes since last appointment, and no other complaints at this time.   Objective: AAO x3, NAD DP/PT pulses palpable bilaterally, CRT less than 3 seconds Incisions are all well coapted without any evidence of dehiscence and sutures are intact to the toes. K wire to the fourth toes intact however to the third toe is about to come out completely. I did remove the K wire today as well as, on the third toe and the toe remained in a rectus position. There is no drainage or pus from the incision site or from the K wire sites. Incision is well coapted there is no signs of infection the toes. Previous second toe amputation. To the hallux status post partial nail avulsion the area is healing well. All granulation tissue is present within the procedure site. There is minimal swelling erythema but there is no ascending synovitis. There is no drainage or pus expressed. Overall this area appears to be doing better.  No open lesions or pre-ulcerative lesions.  No pain with calf compression, swelling, warmth, erythema  Assessment: Status post hammertoe correction digits 3 through 5 on the right, ingrown toenails localized infection to the hallux  Plan: -All treatment options discussed with the patient including all alternatives, risks, complications.  -Remove the K wire the third toe due to any complications or  bleeding in total. Left fifth K wire to the fourth toe intact. Interbody on the bandages applied to these incisions. Continue with surgical boot. -Continue cleaning the right hallux with antibacterial soap cover with Neosporin and a Band-Aid. This. His been doing better. Finish course of antibiotics. -Monitor for any clinical signs or symptoms of infection and directed to call the office immediately should any occur or go to the ER. -RTC 1 week or sooner if needed.  -Patient encouraged to call the office with any questions, concerns, change in symptoms.   Celesta Gentile, DPM

## 2016-10-05 ENCOUNTER — Ambulatory Visit
Admission: RE | Admit: 2016-10-05 | Discharge: 2016-10-05 | Disposition: A | Discharge status: Home / Self Care | Payer: 59 | Source: Ambulatory Visit | Attending: Pain Medicine | Admitting: Pain Medicine

## 2016-10-05 ENCOUNTER — Telehealth (HOSPITAL_COMMUNITY): Payer: Self-pay

## 2016-10-05 DIAGNOSIS — M545 Low back pain, unspecified: Secondary | ICD-10-CM

## 2016-10-05 NOTE — Telephone Encounter (Signed)
Encounter complete. 

## 2016-10-08 ENCOUNTER — Encounter: Payer: Self-pay | Admitting: Podiatry

## 2016-10-08 ENCOUNTER — Ambulatory Visit (INDEPENDENT_AMBULATORY_CARE_PROVIDER_SITE_OTHER): Payer: 59 | Admitting: Podiatry

## 2016-10-08 ENCOUNTER — Ambulatory Visit (INDEPENDENT_AMBULATORY_CARE_PROVIDER_SITE_OTHER): Payer: 59

## 2016-10-08 DIAGNOSIS — M2041 Other hammer toe(s) (acquired), right foot: Secondary | ICD-10-CM

## 2016-10-10 NOTE — Progress Notes (Signed)
Subjective: 51 year old female presents the office they for follow up evaluation of ingrown toenail, infection to the right big toe and for s/p surgery to the digits 3-5. She states that she is doing well. The hallux is doing well and there is no drainage or pus. No pain (although she does have neuropathy). She states that the other toes are doing well and she has not had any issues with the 4th toe k-wire. She has remained in the surgical boot. She continues cleaning the big toe with antibacterial soap and covering with antibiotic ointment and a bandage. Denies any systemic complaints such as fevers, chills, nausea, vomiting. No acute changes since last appointment, and no other complaints at this time.   Objective: AAO x3, NAD DP/PT pulses palpable bilaterally, CRT less than 3 seconds Incisions are all well coapted without any evidence of dehiscence and sutures are intact to the toes. K wire to the fourth toes intact without any drainage or pus. The lesser digits toes are in a rectus position. No pain. Minimal swelling. No redness or ascending cellulitis. No malodor.  S/p partial nail avulsion to the right hallux. This is healing well. There is no drainage or pus. There is faint erythema immediately around the procedure site but no ascending cellulitis. No malodor. No fluctuance or crepitance. This looks much improved compared to before the procedure.  No open lesions or pre-ulcerative lesions.  No pain with calf compression, swelling, warmth, erythema  Assessment: Status post hammertoe correction digits 3 through 5 on the right, ingrown toenail with resolved infection to the hallux  Plan: -All treatment options discussed with the patient including all alternatives, risks, complications.  -The k-wire to the 4th toe is stable therefore, I will try to leave for another 2 weeks. Antibiotic ointment and bandage applied to the lesser digits. Continue with cleaning the right hallux with antibacterial  soap cover with Neosporin and a Band-Aid. This. His been doing better. Finish course of antibiotics. -Monitor for any clinical signs or symptoms of infection and directed to call the office immediately should any occur or go to the ER. -RTC in 2 week or sooner if needed.  At that time the k-wire will be removed and then I will see her back 2 weeks after that.  -Patient encouraged to call the office with any questions, concerns, change in symptoms.   Celesta Gentile, DPM

## 2016-10-11 ENCOUNTER — Other Ambulatory Visit: Payer: Self-pay | Admitting: Family Medicine

## 2016-10-11 MED ORDER — ALBUTEROL SULFATE HFA 108 (90 BASE) MCG/ACT IN AERS: INHALATION_SPRAY | RESPIRATORY_TRACT | 3 refills | Status: DC

## 2016-10-12 ENCOUNTER — Ambulatory Visit (HOSPITAL_COMMUNITY)
Admission: RE | Admit: 2016-10-12 | Discharge: 2016-10-12 | Disposition: A | Discharge status: Home / Self Care | Payer: Medicare Other | Source: Ambulatory Visit | Attending: Cardiovascular Disease | Admitting: Cardiovascular Disease

## 2016-10-12 DIAGNOSIS — E669 Obesity, unspecified: Secondary | ICD-10-CM | POA: Insufficient documentation

## 2016-10-12 DIAGNOSIS — Z8249 Family history of ischemic heart disease and other diseases of the circulatory system: Secondary | ICD-10-CM | POA: Diagnosis not present

## 2016-10-12 DIAGNOSIS — R079 Chest pain, unspecified: Secondary | ICD-10-CM

## 2016-10-12 MED ORDER — REGADENOSON 0.4 MG/5ML IV SOLN
0.4000 mg | Freq: Once | INTRAVENOUS | Status: AC
  Administered 2016-10-12: 0.4 mg via INTRAVENOUS

## 2016-10-12 MED ORDER — TECHNETIUM TC 99M TETROFOSMIN IV KIT
28.0000 | PACK | Freq: Once | INTRAVENOUS | Status: AC | PRN
  Administered 2016-10-12: 28 via INTRAVENOUS
  Filled 2016-10-12: qty 28

## 2016-10-13 ENCOUNTER — Ambulatory Visit (HOSPITAL_COMMUNITY)
Admission: RE | Admit: 2016-10-13 | Discharge: 2016-10-13 | Disposition: A | Discharge status: Home / Self Care | Payer: 59 | Source: Ambulatory Visit | Attending: Internal Medicine | Admitting: Internal Medicine

## 2016-10-13 LAB — MYOCARDIAL PERFUSION IMAGING
LV dias vol: 84 mL (ref 46–106)
LV sys vol: 28 mL
Peak HR: 105 {beats}/min
Rest HR: 85 {beats}/min
SDS: 6
SRS: 0
SSS: 6
TID: 0.82

## 2016-10-13 MED ORDER — TECHNETIUM TC 99M TETROFOSMIN IV KIT
29.8000 | PACK | Freq: Once | INTRAVENOUS | Status: AC | PRN
  Administered 2016-10-13: 29.8 via INTRAVENOUS

## 2016-10-14 ENCOUNTER — Other Ambulatory Visit: Payer: Self-pay | Admitting: Cardiovascular Disease

## 2016-10-14 ENCOUNTER — Ambulatory Visit (INDEPENDENT_AMBULATORY_CARE_PROVIDER_SITE_OTHER): Payer: 59

## 2016-10-14 DIAGNOSIS — R000 Tachycardia, unspecified: Secondary | ICD-10-CM | POA: Diagnosis not present

## 2016-10-14 DIAGNOSIS — R002 Palpitations: Secondary | ICD-10-CM

## 2016-10-14 DIAGNOSIS — R079 Chest pain, unspecified: Secondary | ICD-10-CM

## 2016-10-14 DIAGNOSIS — R55 Syncope and collapse: Secondary | ICD-10-CM | POA: Diagnosis not present

## 2016-10-22 ENCOUNTER — Encounter: Payer: Self-pay | Admitting: Podiatry

## 2016-10-22 ENCOUNTER — Ambulatory Visit (INDEPENDENT_AMBULATORY_CARE_PROVIDER_SITE_OTHER): Payer: 59

## 2016-10-22 ENCOUNTER — Ambulatory Visit (INDEPENDENT_AMBULATORY_CARE_PROVIDER_SITE_OTHER): Payer: 59 | Admitting: Podiatry

## 2016-10-22 VITALS — BP 131/76 | HR 86 | Resp 16

## 2016-10-22 DIAGNOSIS — M2041 Other hammer toe(s) (acquired), right foot: Secondary | ICD-10-CM

## 2016-10-25 ENCOUNTER — Other Ambulatory Visit: Payer: Self-pay | Admitting: Physician Assistant

## 2016-10-25 ENCOUNTER — Ambulatory Visit
Admission: RE | Admit: 2016-10-25 | Discharge: 2016-10-25 | Disposition: A | Discharge status: Home / Self Care | Payer: 59 | Source: Ambulatory Visit | Attending: Physician Assistant | Admitting: Physician Assistant

## 2016-10-25 DIAGNOSIS — M25551 Pain in right hip: Secondary | ICD-10-CM

## 2016-10-25 NOTE — Progress Notes (Signed)
Subjective:    Patient ID: [name] [name], female   DOB: 51 y.o.   MRN: [id number]   HPI patient states that she is having minimal discomfort 6 weeks after having foot surgery right and presents for pin removal fourth digit right    ROS      Objective:  Physical Exam neurovascular status intact with negative Homans sign noted and well coapted incision sites on the lesser digits of the right foot. The patient has a pin in the right fourth toe that's in good alignment with no drainage and the toe is well positioned     Assessment:   Doing well post surgery right with pin that needs to be removed fourth digit      Plan:    Pin was removed sterile dressing applied and x-ray reviewed and advised on continued immobilization. Reappoint for Dr. Jacqualyn Posey to reevaluate in approximately 2 weeks or earlier if any issues should occur  X-ray indicates that the toes in good alignment as are the adjacent digits with no pathology with history of amputation of the second digit

## 2016-11-02 ENCOUNTER — Telehealth: Payer: Self-pay | Admitting: Family Medicine

## 2016-11-02 MED ORDER — POTASSIUM CHLORIDE CRYS ER 20 MEQ PO TBCR: 40.0000 meq | EXTENDED_RELEASE_TABLET | Freq: Every day | ORAL | 3 refills | Status: DC

## 2016-11-02 MED ORDER — LUBIPROSTONE 24 MCG PO CAPS: ORAL_CAPSULE | ORAL | 3 refills | Status: DC

## 2016-11-02 NOTE — Telephone Encounter (Signed)
Pt needs refills on amitiza, and potassium chloride.

## 2016-11-02 NOTE — Telephone Encounter (Signed)
Medication called/sent to requested pharmacy  

## 2016-11-05 ENCOUNTER — Ambulatory Visit: Payer: 59 | Admitting: Podiatry

## 2016-11-07 ENCOUNTER — Other Ambulatory Visit: Payer: Self-pay | Admitting: Family Medicine

## 2016-11-12 ENCOUNTER — Ambulatory Visit (INDEPENDENT_AMBULATORY_CARE_PROVIDER_SITE_OTHER): Payer: 59

## 2016-11-12 ENCOUNTER — Ambulatory Visit (INDEPENDENT_AMBULATORY_CARE_PROVIDER_SITE_OTHER): Payer: Self-pay | Admitting: Podiatry

## 2016-11-12 ENCOUNTER — Encounter: Payer: Self-pay | Admitting: Podiatry

## 2016-11-12 DIAGNOSIS — M2041 Other hammer toe(s) (acquired), right foot: Secondary | ICD-10-CM | POA: Diagnosis not present

## 2016-11-12 DIAGNOSIS — L600 Ingrowing nail: Secondary | ICD-10-CM

## 2016-11-14 NOTE — Progress Notes (Signed)
Subjective: [name] [name] is a 51 y.o. is seen today in office s/p right hammertoe repair 3-5. She states that when she is in a cam boot she had no pain to her foot that since going to the surgical shoe she has noticed some pain and she points along the bunion as well as the ball of her foot.  She denies any recent injury or trauma. Denies any systemic complaints such as fevers, chills, nausea, vomiting. No calf pain, chest pain, shortness of breath.   Objective: General: No acute distress, AAOx3  DP/PT pulses palpable 2/4, CRT < 3 sec to all digits.  Protective sensation intact. Motor function intact.  Rightfoot: Incision is well coapted without any evidence of dehiscence and scars are well formed. There is no surrounding erythema, ascending cellulitis, fluctuance, crepitus, malodor, drainage/purulence. There is trace edema around the surgical site. There is no pain along the surgical site. The toes to sit in a slightly deviated position a transverse plane however in the sagittal plane they are rectus. She states that she is happy with the position of the toes.  The area that she's having pain is on the bunion on the right foot as well as second metatarsal. There is no specific area pinpoint tenderness. No other areas of tenderness to bilateral lower extremities.  No other open lesions or pre-ulcerative lesions.  No pain with calf compression, swelling, warmth, erythema.   Assessment and Plan:  Status post right foot surgery, doing well with no complications   -Treatment options discussed including all alternatives, risks, and complications -X-rays were obtained and reviewed with the patient. No evidence of acute fracture identified. Status post second toe partial amputation as well as hammertoe repair of the lesser digits.  -I recommend return to the cam boot as this is more comfortable for her. Once the pain subsides she can return to regular shoe as tolerated.  -Ice/elevation -Monitor for  any clinical signs or symptoms of infection and DVT/PE and directed to call the office immediately should any occur or go to the ER. -Follow-up as scheduled or sooner if any problems arise. In the meantime, encouraged to call the office with any questions, concerns, change in symptoms.   Celesta Gentile, DPM

## 2016-11-17 ENCOUNTER — Other Ambulatory Visit: Payer: Self-pay | Admitting: Orthopaedic Surgery

## 2016-11-17 DIAGNOSIS — M542 Cervicalgia: Secondary | ICD-10-CM

## 2016-11-18 ENCOUNTER — Ambulatory Visit (INDEPENDENT_AMBULATORY_CARE_PROVIDER_SITE_OTHER): Payer: 59 | Admitting: Family Medicine

## 2016-11-18 ENCOUNTER — Encounter: Payer: Self-pay | Admitting: Family Medicine

## 2016-11-18 VITALS — BP 126/76 | HR 78 | Temp 98.4°F | Resp 18 | Ht 63.5 in | Wt 248.0 lb

## 2016-11-18 DIAGNOSIS — I10 Essential (primary) hypertension: Secondary | ICD-10-CM

## 2016-11-18 DIAGNOSIS — E876 Hypokalemia: Secondary | ICD-10-CM

## 2016-11-18 DIAGNOSIS — Z0000 Encounter for general adult medical examination without abnormal findings: Secondary | ICD-10-CM | POA: Diagnosis not present

## 2016-11-18 NOTE — Progress Notes (Signed)
Subjective:    Patient ID: [name] [name], female    DOB: 26-Aug-1965, 51 y.o.   MRN: [id number]  HPI Patient is a 51 year old white female here today for complete physical exam. Her last mammogram was in June 2017. She has been contacted by that office to reschedule this for this year. She's been screened for hepatitis C. She is due for a tetanus shot but her insurance will not cover this and so she declines it. She is also due for the shingles vaccine however her insurance will not fully cover this and she declines flu vaccination today. She has a history of a hysterectomy and therefore she does not require Pap smears but she still has a pelvic exam and Pap smear performed by her gynecologist annually. Her last colonoscopy was in 2013 and was completely clear. She is not due for repeat the colonoscopy until 2023. Otherwise she is doing well aside from her chronic low back pain and chronic right leg pain stemming from her multiple surgeries pertaining to her degenerative disc disease. Past Medical History:  Diagnosis Date  . Anxiety   . Asthma   . Bipolar 1 disorder ([place])    ect treatments last treatment Sep 02 1011  . Depression   . GERD (gastroesophageal reflux disease)   . Hypertension   . Seizures ([place])    last seizure was so long ago she can't remember.   . Sleep apnea    Past Surgical History:  Procedure Laterality Date  . ABDOMINAL HYSTERECTOMY    . BACK SURGERY    . CARPAL TUNNEL RELEASE     x2  . Laproscopic knee surgery    . PLANTAR FASCIA RELEASE     x2   Current Outpatient Prescriptions on File Prior to Visit  Medication Sig Dispense Refill  . albuterol (PROAIR HFA) 108 (90 Base) MCG/ACT inhaler INHALE 2 PUFFS INTO THE LUNGS EVERY 4 HOURS AS NEEDED FOR WHEEZING 8.5 Inhaler 3  . busPIRone (BUSPAR) 5 MG tablet Take 5 mg by mouth 3 (three) times daily.    . Calcium Carbonate-Vit D-Min (CALCIUM 1200 PO) Take 1 tablet by mouth daily.    . citalopram (CELEXA) 40 MG tablet  Take 40 mg by mouth at bedtime.     . cyclobenzaprine (FLEXERIL) 10 MG tablet Take 10 mg by mouth 3 (three) times daily as needed for muscle spasms.    . EMBEDA 30-1.2 MG CPCR Take 1 capsule by mouth 2 (two) times daily.     . fluticasone (FLONASE) 50 MCG/ACT nasal spray Place 2 sprays into both nostrils daily. (Patient taking differently: Place 2 sprays into both nostrils daily as needed for allergies. ) 48 g 2  . gabapentin (NEURONTIN) 600 MG tablet Take 1 tablet (600 mg total) by mouth 3 (three) times daily. For anxiety and pain management. May have to stop to take ECT. 90 tablet 0  . haloperidol (HALDOL) 5 MG tablet Take 1 tablet (5 mg total) by mouth at bedtime. For psychosis    . hydrochlorothiazide (HYDRODIURIL) 25 MG tablet TAKE 1 TABLET EVERY DAY 90 tablet 3  . lubiprostone (AMITIZA) 24 MCG capsule TAKE 1 CAPSULE TWICE A DAY WITH A MEAL 180 capsule 3  . Multiple Vitamins-Minerals (MULTIVITAMIN WITH MINERALS) tablet Take 1 tablet by mouth daily.    . nitroGLYCERIN (NITROSTAT) 0.4 MG SL tablet Place 1 tablet (0.4 mg total) under the tongue every 5 (five) minutes as needed for chest pain. 30 tablet 0  . oxyCODONE-acetaminophen (  PERCOCET/ROXICET) 5-325 MG tablet Take 1 tablet by mouth every 4 (four) hours as needed for severe pain.     . pantoprazole (PROTONIX) 40 MG tablet TAKE 1 TABLET BY MOUTH TWICE A DAY 180 tablet 2  . potassium chloride SA (KLOR-CON M20) 20 MEQ tablet Take 2 tablets (40 mEq total) by mouth daily. 180 tablet 3  . prazosin (MINIPRESS) 5 MG capsule Take 5 mg by mouth at bedtime.    . promethazine (PHENERGAN) 25 MG tablet Take 1 tablet (25 mg total) by mouth every 8 (eight) hours as needed for nausea or vomiting. 20 tablet 0  . SUMAtriptan (IMITREX) 100 MG tablet TAKE 1 TABLET (100 MG TOTAL) BY MOUTH EVERY 2 (TWO) HOURS AS NEEDED. MIGRANES 10 tablet 2  . traZODone (DESYREL) 100 MG tablet Take 100 mg by mouth at bedtime.  0  . [DISCONTINUED] lithium carbonate (LITHOBID) 300 MG  CR tablet Take 1 tablet (300 mg total) by mouth every 12 (twelve) hours. For mood swings and depression 60 tablet 0   No current facility-administered medications on file prior to visit.    Allergies  Allergen Reactions  . Tetracyclines & Related Other (See Comments)    syncope  . Tramadol Other (See Comments)    seizures  . Ciprofloxacin Rash  . Codeine Itching  . Penicillins Hives and Rash   Social History   Social History  . Marital status: Legally Separated    Spouse name: N/A  . Number of children: 3  . Years of education: 12   Occupational History  . Disabled    Social History Main Topics  . Smoking status: Never Smoker  . Smokeless tobacco: Never Used  . Alcohol use No  . Drug use: No  . Sexual activity: No   Other Topics Concern  . Not on file   Social History Narrative   Patient drink 4 16oz caffeine drinks a day    Family History  Problem Relation Age of Onset  . Diabetes Mother   . COPD Mother   . Hypertension Mother   . Hyperlipidemia Mother   . Heart disease Father   . Hyperlipidemia Father   . Hypertension Father   . Cancer Father   . Heart disease Brother   . Heart disease Daughter       Review of Systems  All other systems reviewed and are negative.      Objective:   Physical Exam  Constitutional: She is oriented to person, place, and time. She appears well-developed and well-nourished. No distress.  HENT:  Head: Normocephalic and atraumatic.  Right Ear: External ear normal.  Left Ear: External ear normal.  Nose: Nose normal.  Mouth/Throat: Oropharynx is clear and moist. No oropharyngeal exudate.  Eyes: Pupils are equal, round, and reactive to light. Conjunctivae and EOM are normal. Right eye exhibits no discharge. Left eye exhibits no discharge. No scleral icterus.  Neck: Normal range of motion. Neck supple. No JVD present. No tracheal deviation present. No thyromegaly present.  Cardiovascular: Normal rate, regular rhythm, normal  heart sounds and intact distal pulses.  Exam reveals no gallop and no friction rub.   No murmur heard. Pulmonary/Chest: Effort normal and breath sounds normal. No stridor. No respiratory distress. She has no wheezes. She has no rales. She exhibits no tenderness.  Abdominal: Soft. Bowel sounds are normal. She exhibits no distension and no mass. There is no tenderness. There is no rebound and no guarding.  Musculoskeletal: She exhibits no edema.  Right knee: She exhibits decreased range of motion. Tenderness found.       Lumbar back: She exhibits decreased range of motion, tenderness and pain.       Back:       Legs: Lymphadenopathy:    She has no cervical adenopathy.  Neurological: She is alert and oriented to person, place, and time. She has normal reflexes. No cranial nerve deficit. She exhibits normal muscle tone. Coordination normal.  Skin: Skin is warm. No rash noted. She is not diaphoretic. No erythema. No pallor.  Psychiatric: She has a normal mood and affect. Her behavior is normal. Judgment and thought content normal.  Vitals reviewed.         Assessment & Plan:  Benign essential HTN - Plan: CBC with Differential/Platelet, COMPLETE METABOLIC PANEL WITH GFR, Lipid panel  Routine general medical examination at a health care facility  Hypokalemia  Patient has morbid obesity.  I strongly encouraged her to start exercising.  Due to her chronic low back pain (the patient has had 5 back surgeries and 7 surgeries on the right leg), she is unable to exercise vigorously. However we discussed options to increase her physical activity such as using a hand bike, water aerobics, occasional walking, "standup/sit down" exercises. We also discussed 1500 cal a day and eating more fresh fruits and vegetables and staying away from processed foods, saturated fat, red meat, pork, and a high carbohydrate diet. I will check a CBC, CMP, and fasting lipid panel. Her blood pressure today is well  controlled area she receives the remainder of her care at her gynecologist.

## 2016-11-19 LAB — COMPLETE METABOLIC PANEL WITH GFR
ALT: 17 U/L (ref 6–29)
AST: 19 U/L (ref 10–35)
Albumin: 3.7 g/dL (ref 3.6–5.1)
Alkaline Phosphatase: 84 U/L (ref 33–130)
BUN: 8 mg/dL (ref 7–25)
CO2: 27 mmol/L (ref 20–31)
Calcium: 9.3 mg/dL (ref 8.6–10.4)
Chloride: 103 mmol/L (ref 98–110)
Creat: 0.62 mg/dL (ref 0.50–1.05)
GFR, Est African American: >89 mL/min (ref >=60)
GFR, Est Non African American: >89 mL/min (ref >=60)
Glucose, Bld: 92 mg/dL (ref 70–99)
Potassium: 4.5 mmol/L (ref 3.5–5.3)
Sodium: 138 mmol/L (ref 135–146)
Total Bilirubin: 0.3 mg/dL (ref 0.2–1.2)
Total Protein: 6.4 g/dL (ref 6.1–8.1)

## 2016-11-19 LAB — LIPID PANEL
Cholesterol: 177 mg/dL (ref <200)
HDL: 48 mg/dL — ABNORMAL LOW (ref >50)
LDL Cholesterol: 84 mg/dL (ref <100)
Total CHOL/HDL Ratio: 3.7 Ratio (ref <5.0)
Triglycerides: 224 mg/dL — ABNORMAL HIGH (ref <150)
VLDL: 45 mg/dL — ABNORMAL HIGH (ref <30)

## 2016-11-19 LAB — CBC WITH DIFFERENTIAL/PLATELET
Basophils Absolute: 0 cells/uL (ref 0–200)
Basophils Relative: 0 %
Eosinophils Absolute: 126 cells/uL (ref 15–500)
Eosinophils Relative: 2 %
HCT: 36.3 % (ref 35.0–45.0)
Hemoglobin: 11.9 g/dL — ABNORMAL LOW (ref 12.0–15.0)
Lymphocytes Relative: 30 %
Lymphs Abs: 1890 cells/uL (ref 850–3900)
MCH: 31.2 pg (ref 27.0–33.0)
MCHC: 32.8 g/dL (ref 32.0–36.0)
MCV: 95 fL (ref 80.0–100.0)
MPV: 8.5 fL (ref 7.5–12.5)
Monocytes Absolute: 441 cells/uL (ref 200–950)
Monocytes Relative: 7 %
Neutro Abs: 3843 cells/uL (ref 1500–7800)
Neutrophils Relative %: 61 %
Platelets: 309 10*3/uL (ref 140–400)
RBC: 3.82 MIL/uL (ref 3.80–5.10)
RDW: 15.3 % — ABNORMAL HIGH (ref 11.0–15.0)
WBC: 6.3 10*3/uL (ref 3.8–10.8)

## 2016-11-23 ENCOUNTER — Observation Stay (HOSPITAL_COMMUNITY): Payer: 59

## 2016-11-23 ENCOUNTER — Ambulatory Visit (INDEPENDENT_AMBULATORY_CARE_PROVIDER_SITE_OTHER): Payer: 59 | Admitting: Cardiovascular Disease

## 2016-11-23 ENCOUNTER — Emergency Department (HOSPITAL_COMMUNITY): Payer: 59

## 2016-11-23 ENCOUNTER — Encounter (HOSPITAL_COMMUNITY): Payer: Self-pay | Admitting: Emergency Medicine

## 2016-11-23 ENCOUNTER — Encounter: Payer: Self-pay | Admitting: Cardiovascular Disease

## 2016-11-23 ENCOUNTER — Observation Stay (HOSPITAL_COMMUNITY)
Admission: EM | Admit: 2016-11-23 | Discharge: 2016-11-25 | Disposition: A | Discharge status: Home / Self Care | Payer: 59 | Attending: Cardiovascular Disease | Admitting: Cardiovascular Disease

## 2016-11-23 VITALS — BP 134/86 | HR 92 | Ht 64.0 in | Wt 241.0 lb

## 2016-11-23 DIAGNOSIS — R55 Syncope and collapse: Secondary | ICD-10-CM

## 2016-11-23 DIAGNOSIS — F419 Anxiety disorder, unspecified: Secondary | ICD-10-CM | POA: Insufficient documentation

## 2016-11-23 DIAGNOSIS — Z6841 Body Mass Index (BMI) 40.0 and over, adult: Secondary | ICD-10-CM | POA: Insufficient documentation

## 2016-11-23 DIAGNOSIS — M961 Postlaminectomy syndrome, not elsewhere classified: Secondary | ICD-10-CM | POA: Diagnosis not present

## 2016-11-23 DIAGNOSIS — Z79899 Other long term (current) drug therapy: Secondary | ICD-10-CM | POA: Diagnosis not present

## 2016-11-23 DIAGNOSIS — E876 Hypokalemia: Secondary | ICD-10-CM | POA: Insufficient documentation

## 2016-11-23 DIAGNOSIS — G4733 Obstructive sleep apnea (adult) (pediatric): Secondary | ICD-10-CM | POA: Insufficient documentation

## 2016-11-23 DIAGNOSIS — F32A Depression, unspecified: Secondary | ICD-10-CM | POA: Diagnosis present

## 2016-11-23 DIAGNOSIS — R42 Dizziness and giddiness: Secondary | ICD-10-CM

## 2016-11-23 DIAGNOSIS — K219 Gastro-esophageal reflux disease without esophagitis: Secondary | ICD-10-CM | POA: Insufficient documentation

## 2016-11-23 DIAGNOSIS — I10 Essential (primary) hypertension: Secondary | ICD-10-CM | POA: Insufficient documentation

## 2016-11-23 DIAGNOSIS — R1032 Left lower quadrant pain: Secondary | ICD-10-CM

## 2016-11-23 DIAGNOSIS — F3131 Bipolar disorder, current episode depressed, mild: Secondary | ICD-10-CM | POA: Insufficient documentation

## 2016-11-23 DIAGNOSIS — J328 Other chronic sinusitis: Secondary | ICD-10-CM

## 2016-11-23 DIAGNOSIS — Z981 Arthrodesis status: Secondary | ICD-10-CM | POA: Insufficient documentation

## 2016-11-23 DIAGNOSIS — F329 Major depressive disorder, single episode, unspecified: Secondary | ICD-10-CM | POA: Diagnosis present

## 2016-11-23 DIAGNOSIS — Z880 Allergy status to penicillin: Secondary | ICD-10-CM | POA: Diagnosis not present

## 2016-11-23 DIAGNOSIS — Z8249 Family history of ischemic heart disease and other diseases of the circulatory system: Secondary | ICD-10-CM | POA: Insufficient documentation

## 2016-11-23 DIAGNOSIS — G894 Chronic pain syndrome: Secondary | ICD-10-CM | POA: Diagnosis not present

## 2016-11-23 DIAGNOSIS — K5900 Constipation, unspecified: Secondary | ICD-10-CM | POA: Insufficient documentation

## 2016-11-23 DIAGNOSIS — J45909 Unspecified asthma, uncomplicated: Secondary | ICD-10-CM | POA: Diagnosis not present

## 2016-11-23 DIAGNOSIS — R109 Unspecified abdominal pain: Secondary | ICD-10-CM

## 2016-11-23 LAB — CBG MONITORING, ED: Glucose-Capillary: 100 mg/dL — ABNORMAL HIGH (ref 65–99)

## 2016-11-23 LAB — CBC
HCT: 35.1 % — ABNORMAL LOW (ref 36.0–46.0)
Hemoglobin: 11.8 g/dL — ABNORMAL LOW (ref 12.0–15.0)
MCH: 30.6 pg (ref 26.0–34.0)
MCHC: 33.6 g/dL (ref 30.0–36.0)
MCV: 91.2 fL (ref 78.0–100.0)
Platelets: 286 10*3/uL (ref 150–400)
RBC: 3.85 MIL/uL — ABNORMAL LOW (ref 3.87–5.11)
RDW: 14.8 % (ref 11.5–15.5)
WBC: 6.9 10*3/uL (ref 4.0–10.5)

## 2016-11-23 LAB — COMPREHENSIVE METABOLIC PANEL
ALT: 16 U/L (ref 14–54)
AST: 25 U/L (ref 15–41)
Albumin: 3 g/dL — ABNORMAL LOW (ref 3.5–5.0)
Alkaline Phosphatase: 61 U/L (ref 38–126)
Anion gap: 10 (ref 5–15)
BUN: 7 mg/dL (ref 6–20)
CO2: 21 mmol/L — ABNORMAL LOW (ref 22–32)
Calcium: 8.2 mg/dL — ABNORMAL LOW (ref 8.9–10.3)
Chloride: 108 mmol/L (ref 101–111)
Creatinine, Ser: 0.53 mg/dL (ref 0.44–1.00)
GFR calc Af Amer: >60 mL/min (ref >60)
GFR calc non Af Amer: >60 mL/min (ref >60)
Glucose, Bld: 84 mg/dL (ref 65–99)
Potassium: 3.7 mmol/L (ref 3.5–5.1)
Sodium: 139 mmol/L (ref 135–145)
Total Bilirubin: 0.5 mg/dL (ref 0.3–1.2)
Total Protein: 5.8 g/dL — ABNORMAL LOW (ref 6.5–8.1)

## 2016-11-23 LAB — I-STAT TROPONIN, ED: Troponin i, poc: 0 ng/mL (ref 0.00–0.08)

## 2016-11-23 LAB — TROPONIN I: Troponin I: <0.03 ng/mL (ref <0.03)

## 2016-11-23 LAB — URINALYSIS, ROUTINE W REFLEX MICROSCOPIC
Bilirubin Urine: NEGATIVE
Glucose, UA: NEGATIVE mg/dL
Hgb urine dipstick: NEGATIVE
Ketones, ur: NEGATIVE mg/dL
Leukocytes, UA: NEGATIVE
Nitrite: NEGATIVE
Protein, ur: NEGATIVE mg/dL
Specific Gravity, Urine: 1.011 (ref 1.005–1.030)
pH: 8 (ref 5.0–8.0)

## 2016-11-23 LAB — D-DIMER, QUANTITATIVE: D-Dimer, Quant: 0.62 ug/mL-FEU — ABNORMAL HIGH (ref 0.00–0.50)

## 2016-11-23 MED ORDER — IOPAMIDOL (ISOVUE-300) INJECTION 61%
INTRAVENOUS | Status: AC
  Administered 2016-11-23: 100 mL
  Filled 2016-11-23: qty 100

## 2016-11-23 MED ORDER — ALBUTEROL SULFATE (2.5 MG/3ML) 0.083% IN NEBU: 2.5000 mg | INHALATION_SOLUTION | RESPIRATORY_TRACT | Status: DC | PRN

## 2016-11-23 MED ORDER — ESTRADIOL 1 MG PO TABS
1.0000 mg | ORAL_TABLET | Freq: Every day | ORAL | Status: DC
  Administered 2016-11-24 – 2016-11-25 (×2): 1 mg via ORAL
  Filled 2016-11-23 (×2): qty 1

## 2016-11-23 MED ORDER — ZOLPIDEM TARTRATE 5 MG PO TABS: 5.0000 mg | ORAL_TABLET | Freq: Every evening | ORAL | Status: DC | PRN

## 2016-11-23 MED ORDER — TRAZODONE HCL 100 MG PO TABS
100.0000 mg | ORAL_TABLET | Freq: Every day | ORAL | Status: DC
  Administered 2016-11-23 – 2016-11-24 (×2): 100 mg via ORAL
  Filled 2016-11-23 (×2): qty 1

## 2016-11-23 MED ORDER — HALOPERIDOL 5 MG PO TABS
5.0000 mg | ORAL_TABLET | Freq: Every day | ORAL | Status: DC
  Administered 2016-11-23 – 2016-11-24 (×2): 5 mg via ORAL
  Filled 2016-11-23 (×2): qty 1

## 2016-11-23 MED ORDER — ACETAMINOPHEN 325 MG PO TABS: 650.0000 mg | ORAL_TABLET | ORAL | Status: DC | PRN

## 2016-11-23 MED ORDER — ALPRAZOLAM 0.25 MG PO TABS: 0.2500 mg | ORAL_TABLET | Freq: Two times a day (BID) | ORAL | Status: DC | PRN

## 2016-11-23 MED ORDER — PANTOPRAZOLE SODIUM 40 MG PO TBEC
40.0000 mg | DELAYED_RELEASE_TABLET | Freq: Two times a day (BID) | ORAL | Status: DC
  Administered 2016-11-23 – 2016-11-25 (×4): 40 mg via ORAL
  Filled 2016-11-23 (×4): qty 1

## 2016-11-23 MED ORDER — HYDROCHLOROTHIAZIDE 25 MG PO TABS
25.0000 mg | ORAL_TABLET | Freq: Every day | ORAL | Status: DC
  Administered 2016-11-24 – 2016-11-25 (×2): 25 mg via ORAL
  Filled 2016-11-23 (×3): qty 1

## 2016-11-23 MED ORDER — ADULT MULTIVITAMIN W/MINERALS CH
1.0000 | ORAL_TABLET | Freq: Every day | ORAL | Status: DC
  Administered 2016-11-24 – 2016-11-25 (×2): 1 via ORAL
  Filled 2016-11-23 (×2): qty 1

## 2016-11-23 MED ORDER — MORPHINE-NALTREXONE 30-1.2 MG PO CPCR: 1.0000 | ORAL_CAPSULE | Freq: Two times a day (BID) | ORAL | Status: DC

## 2016-11-23 MED ORDER — BUSPIRONE HCL 10 MG PO TABS
5.0000 mg | ORAL_TABLET | Freq: Three times a day (TID) | ORAL | Status: DC
  Administered 2016-11-23 – 2016-11-25 (×5): 5 mg via ORAL
  Filled 2016-11-23 (×5): qty 1

## 2016-11-23 MED ORDER — OXYCODONE-ACETAMINOPHEN 5-325 MG PO TABS
1.0000 | ORAL_TABLET | ORAL | Status: DC | PRN
  Administered 2016-11-23 – 2016-11-25 (×6): 1 via ORAL
  Filled 2016-11-23 (×6): qty 1

## 2016-11-23 MED ORDER — ONDANSETRON HCL 4 MG/2ML IJ SOLN
4.0000 mg | Freq: Four times a day (QID) | INTRAMUSCULAR | Status: DC | PRN
  Administered 2016-11-23 – 2016-11-25 (×4): 4 mg via INTRAVENOUS
  Filled 2016-11-23 (×4): qty 2

## 2016-11-23 MED ORDER — GABAPENTIN 600 MG PO TABS
600.0000 mg | ORAL_TABLET | Freq: Three times a day (TID) | ORAL | Status: DC
  Administered 2016-11-23 – 2016-11-25 (×5): 600 mg via ORAL
  Filled 2016-11-23 (×5): qty 1

## 2016-11-23 MED ORDER — GI COCKTAIL ~~LOC~~: 30.0000 mL | Freq: Four times a day (QID) | ORAL | Status: DC | PRN

## 2016-11-23 MED ORDER — PRAZOSIN HCL 5 MG PO CAPS
5.0000 mg | ORAL_CAPSULE | Freq: Every day | ORAL | Status: DC
  Administered 2016-11-23 – 2016-11-24 (×2): 5 mg via ORAL
  Filled 2016-11-23 (×2): qty 1

## 2016-11-23 MED ORDER — ENOXAPARIN SODIUM 40 MG/0.4ML ~~LOC~~ SOLN
40.0000 mg | SUBCUTANEOUS | Status: DC
  Administered 2016-11-24 – 2016-11-25 (×2): 40 mg via SUBCUTANEOUS
  Filled 2016-11-23 (×2): qty 0.4

## 2016-11-23 MED ORDER — ONDANSETRON HCL 4 MG/2ML IJ SOLN
4.0000 mg | Freq: Once | INTRAMUSCULAR | Status: AC
  Administered 2016-11-23: 4 mg via INTRAVENOUS
  Filled 2016-11-23: qty 2

## 2016-11-23 MED ORDER — PRAZOSIN HCL 2 MG PO CAPS
2.0000 mg | ORAL_CAPSULE | Freq: Every day | ORAL | Status: DC
  Administered 2016-11-24 – 2016-11-25 (×2): 2 mg via ORAL
  Filled 2016-11-23 (×2): qty 1

## 2016-11-23 MED ORDER — FENTANYL CITRATE (PF) 100 MCG/2ML IJ SOLN
50.0000 ug | Freq: Once | INTRAMUSCULAR | Status: AC
  Administered 2016-11-23: 50 ug via INTRAVENOUS
  Filled 2016-11-23: qty 2

## 2016-11-23 MED ORDER — CITALOPRAM HYDROBROMIDE 20 MG PO TABS
40.0000 mg | ORAL_TABLET | Freq: Every day | ORAL | Status: DC
  Administered 2016-11-23 – 2016-11-24 (×2): 40 mg via ORAL
  Filled 2016-11-23 (×2): qty 2

## 2016-11-23 MED ORDER — POTASSIUM CHLORIDE CRYS ER 20 MEQ PO TBCR
40.0000 meq | EXTENDED_RELEASE_TABLET | Freq: Every day | ORAL | Status: DC
Start: 2016-11-24 — End: 2016-11-25
  Administered 2016-11-24 – 2016-11-25 (×2): 40 meq via ORAL
  Filled 2016-11-23 (×2): qty 2

## 2016-11-23 MED ORDER — FLUTICASONE PROPIONATE 50 MCG/ACT NA SUSP
2.0000 | Freq: Every day | NASAL | Status: DC
  Administered 2016-11-24 – 2016-11-25 (×2): 2 via NASAL
  Filled 2016-11-23: qty 16

## 2016-11-23 MED ORDER — LUBIPROSTONE 24 MCG PO CAPS
24.0000 ug | ORAL_CAPSULE | Freq: Two times a day (BID) | ORAL | Status: DC
  Administered 2016-11-23 – 2016-11-25 (×4): 24 ug via ORAL
  Filled 2016-11-23 (×4): qty 1

## 2016-11-23 NOTE — ED Triage Notes (Signed)
Pt arrives from PCP office via [facility] after having a witnessed syncopal episode that lasted approx 45 seconds.  Pt c/o of dizziness and abdominal pain.  Per EMS pt has a hx of syncopal episodes.  On July 28th pt had a Holter Monitor removed and was at the PCP office today to receive the results.  PTA vitals: BP 132/70, HR 84, SP02 92 on RA, CBG 118, GCS 15.  Normal sinus on the monitor.

## 2016-11-23 NOTE — ED Notes (Signed)
Attempted report x1. 

## 2016-11-23 NOTE — Progress Notes (Signed)
Ms. Archuletta returns today for follow-up. I saw her on 09/28/16. Unfortunately, she had a presyncopal episode in the waiting room with normal vital signs are normal EKG. EMS was called and she was transported to [facility] for direct admission and workup. Her oxygen sats were normal as was her CBG.  [name], M.D., [place], [facility], [place] [address] [place], [place]  [address]  [phone] 11/23/2016 2:38 PM

## 2016-11-23 NOTE — H&P (Signed)
Cardiology Admission History and Physical:   Patient ID: [name] [name]; MRN: [id number]; DOB: 1965/05/16   Admission date: 11/23/2016  Primary Care Provider: [name], MD Primary Cardiologist: Dr [name] Primary Electrophysiologist:  n/a  Chief Complaint:  Near-syncope  Patient Profile:   [name] [name] is a 51 y.o. female with a history of CP, tachypalpitations, FH CAD, asthma, anxiety/depression, GERD, Sz, OSA. She was referred to Dr [name] for CP>>MV 09/2016 w/ ?soft tissue attn, EF 67% w/ nl wall motion>>low risk. 30-day event monitor with no afib, no sig tachy- or brady-cardia  History of Present Illness:   [name] [name] came to the office today for a f/u appt.   She was in the office and had an episode of near syncope. She was sitting at the time. She was dizzy and lightheaded. The symptoms worsened and she slumped to the floor. She was initially barely responsive to touch, not verbal. She improved slowly and was able to sit.  By the time her blood pressure was checked, it was normal. Her blood sugar was also checked and was normal. Her only complaint was abdominal pain. She was not having palpitations. She was not on the monitor when this happened, but her ECG showed sinus rhythm. Her heart rate was regular when it was checked during this episode. The heart rate was normal.  She stated that she had been having problems with constipation, no acute on chronic problem. She had fewer stools than usual recently. She did not notice any black tarry stools, but she has had red blood in her stools fairly recently. She has had some upper GI symptoms as well.  She has not had cough or cold symptoms. She has not had fevers or chills. She has had some nausea, but no vomiting.  She has not had exertional chest pain.   Past Medical History:  Diagnosis Date  . Anxiety   . Asthma   . Bipolar 1 disorder ([place])    ect treatments last treatment Sep 02 1011  . Depression   . GERD  (gastroesophageal reflux disease)   . Hypertension   . Seizures ([place])    last seizure was so long ago she can't remember.   . Sleep apnea     Past Surgical History:  Procedure Laterality Date  . ABDOMINAL HYSTERECTOMY    . BACK SURGERY    . CARPAL TUNNEL RELEASE     x2  . Laproscopic knee surgery    . PLANTAR FASCIA RELEASE     x2     Medications Prior to Admission: Prior to Admission medications   Medication Sig Start Date End Date Taking? Authorizing Provider  albuterol (PROAIR HFA) 108 (90 Base) MCG/ACT inhaler INHALE 2 PUFFS INTO THE LUNGS EVERY 4 HOURS AS NEEDED FOR WHEEZING 10/11/16   [name], MD  busPIRone (BUSPAR) 5 MG tablet Take 5 mg by mouth 3 (three) times daily.    [provider]  Calcium Carbonate-Vit D-Min (CALCIUM 1200 PO) Take 1 tablet by mouth daily.    [provider]  citalopram (CELEXA) 40 MG tablet Take 40 mg by mouth at bedtime.  05/28/11   Darrol Jump, MD  cyclobenzaprine (FLEXERIL) 10 MG tablet Take 10 mg by mouth 3 (three) times daily as needed for muscle spasms.    [provider]  EMBEDA 30-1.2 MG CPCR Take 1 capsule by mouth 2 (two) times daily.  10/19/16   [provider]  estradiol (ESTRACE) 1 MG  tablet Take 1 mg by mouth daily. 10/29/16   [provider]  fluticasone (FLONASE) 50 MCG/ACT nasal spray Place 2 sprays into both nostrils daily. Patient taking differently: Place 2 sprays into both nostrils daily as needed for allergies.  04/05/16   [name], MD  gabapentin (NEURONTIN) 600 MG tablet Take 1 tablet (600 mg total) by mouth 3 (three) times daily. For anxiety and pain management. May have to stop to take ECT. 05/28/11   Darrol Jump, MD  haloperidol (HALDOL) 5 MG tablet Take 1 tablet (5 mg total) by mouth at bedtime. For psychosis 11/15/15   Barton Dubois, MD  hydrochlorothiazide (HYDRODIURIL) 25 MG tablet TAKE 1 TABLET EVERY DAY 06/21/16   [name], MD  lubiprostone (AMITIZA)  24 MCG capsule TAKE 1 CAPSULE TWICE A DAY WITH A MEAL 11/02/16   [name], MD  Multiple Vitamins-Minerals (MULTIVITAMIN WITH MINERALS) tablet Take 1 tablet by mouth daily.    [provider]  nitroGLYCERIN (NITROSTAT) 0.4 MG SL tablet Place 1 tablet (0.4 mg total) under the tongue every 5 (five) minutes as needed for chest pain. 10/27/14   Leone Brand, MD  oxyCODONE-acetaminophen (PERCOCET/ROXICET) 5-325 MG tablet Take 1 tablet by mouth every 4 (four) hours as needed for severe pain.     [name], DPM  pantoprazole (PROTONIX) 40 MG tablet TAKE 1 TABLET BY MOUTH TWICE A DAY 11/08/16   [name], MD  potassium chloride SA (KLOR-CON M20) 20 MEQ tablet Take 2 tablets (40 mEq total) by mouth daily. 11/02/16   [name], MD  prazosin (MINIPRESS) 5 MG capsule Take 5 mg by mouth at bedtime.    [provider]  promethazine (PHENERGAN) 25 MG tablet Take 1 tablet (25 mg total) by mouth every 8 (eight) hours as needed for nausea or vomiting. 09/23/16   [name], DPM  SUMAtriptan (IMITREX) 100 MG tablet TAKE 1 TABLET (100 MG TOTAL) BY MOUTH EVERY 2 (TWO) HOURS AS NEEDED. MIGRANES 05/27/14   [name], MD  traZODone (DESYREL) 100 MG tablet Take 100 mg by mouth at bedtime. 07/29/14   [provider]     Allergies:    Allergies  Allergen Reactions  . Tetracyclines & Related Other (See Comments)    syncope  . Tramadol Other (See Comments)    seizures  . Ciprofloxacin Rash  . Codeine Itching  . Penicillins Hives and Rash    Has patient had a PCN reaction causing immediate rash, facial/tongue/throat swelling, SOB or lightheadedness with hypotension: No Has patient had a PCN reaction causing severe rash involving mucus membranes or skin necrosis: No Has patient had a PCN reaction that required hospitalization: No Has patient had a PCN reaction occurring within the last 10 years: No If all of the above answers are "NO", then may proceed  with Cephalosporin use.    Social History:   Social History   Social History  . Marital status: Legally Separated    Spouse name: N/A  . Number of children: 3  . Years of education: 12   Occupational History  . Disabled    Social History Main Topics  . Smoking status: Never Smoker  . Smokeless tobacco: Never Used  . Alcohol use No  . Drug use: No  . Sexual activity: No   Other Topics Concern  . Not on file   Social History Narrative   Patient drink 4 16oz caffeine drinks a day  Family History  Problem Relation Age of Onset  . Diabetes Mother   . COPD Mother   . Hypertension Mother   . Hyperlipidemia Mother   . Heart disease Father   . Hyperlipidemia Father   . Hypertension Father   . Cancer Father   . Heart disease Brother   . Heart disease Daughter    Family Status  Relation Status  . Mother Alive  . Father Deceased  . Brother Alive  . Daughter Alive    ROS:  Please see the history of present illness.  All other ROS reviewed and negative.     Physical Exam/Data:   Vitals:   11/23/16 1630 11/23/16 1645 11/23/16 1700 11/23/16 1704  BP: 135/78 131/89 136/83   Pulse: 79 85 87 86  Resp:  12 13 10   Temp:      TempSrc:      SpO2: 95% 96% 98% 96%   No intake or output data in the 24 hours ending 11/23/16 1726 There were no vitals filed for this visit. There is no height or weight on file to calculate BMI.  General:  Well nourished, well developed, in moderate distress HEENT: normal for age Lymph: no adenopathy Neck: no JVD Endocrine:  No thryomegaly Vascular: No carotid bruits; FA pulses 2+ bilaterally without bruits  Cardiac:  normal S1, S2; RRR; soft murmur  Lungs:  clear to auscultation bilaterally, no wheezing, rhonchi or rales  Abd: soft, tender all 4 quadrants, no hepatomegaly  Ext: no edema Musculoskeletal:  No deformities, BUE and BLE strength weak but equal Skin: warm and dry  Neuro:  CNs 2-12 intact, no focal abnormalities  noted Psych:  Normal affect    EKG:  The ECG that was done 08/07 was personally reviewed and demonstrates SR, HR 83. Minimal criteria for low voltage precordial   Relevant CV Studies: MYOVIEW: 10/13/2016 Rest Perfusion Small, mild mid anterior perfusion defect.    Stress Perfusion Small, mild mid anterior perfusion defect.    Perfusion Summary Fixed small, mild mid anterior perfusion defect.    Overall Study Impression Myocardial perfusion is normal. The study is normal. Overall left ventricular systolic function was normal. LV cavity size is normal. Nuclear stress EF: 67%. The left ventricular ejection fraction is hyperdynamic (>65%). There are no images available for comparison.     Nuclear stress EF: 67%.  The left ventricular ejection fraction is hyperdynamic (>65%).  There was no ST segment deviation noted during stress.  The study is normal.  1. EF 67%, normal wall motion.  2. Fixed small, mild mid anterior perfusion defect.  Given normal wall motion, suspect soft tissue attenuation.   Low risk study.    Laboratory Data:  Chemistry  Recent Labs Lab 11/18/16 1522 11/23/16 1558  NA 138 139  K 4.5 3.7  CL 103 108  CO2 27 21*  GLUCOSE 92 84  BUN 8 7  CREATININE 0.62 0.53  CALCIUM 9.3 8.2*  GFRNONAA >89 >60  GFRAA >89 >60  ANIONGAP  --  10     Recent Labs Lab 11/18/16 1522 11/23/16 1558  PROT 6.4 5.8*  ALBUMIN 3.7 3.0*  AST 19 25  ALT 17 16  ALKPHOS 84 61  BILITOT 0.3 0.5   Hematology  Recent Labs Lab 11/18/16 1522 11/23/16 1543  WBC 6.3 6.9  RBC 3.82 3.85*  HGB 11.9* 11.8*  HCT 36.3 35.1*  MCV 95.0 91.2  MCH 31.2 30.6  MCHC 32.8 33.6  RDW 15.3* 14.8  PLT  309 286    Radiology/Studies:  No results found.  Assessment and Plan:   1. Near syncope: - Keep patient on telemetry. - Labs have been done in the emergency room and she has no significant anemia and no electrolyte abnormalities that would account for this. - If no significant  arrhythmias are seen overnight, M.D. advise on an event monitor versus a loop recorder. - will Check orthostatic vital signs - Upon review of her home medications, she is on multiple medications with possible sedation side effects. These may need to be reviewed, for now, I will continue all of her home meds.  2. Abdominal pain and constipation: No labs are only available indicate a cause for this. It may be more or less a chronic problem. We'll check a CT to rule out an acute issue. Guaiac stools. She may need GI evaluation, decide on this in a.m., Sooner if CT is acute.   Severity of Illness: The appropriate patient status for this patient is OBSERVATION. Observation status is judged to be reasonable and necessary in order to provide the required intensity of service to ensure the patient's safety. The patient's presenting symptoms, physical exam findings, and initial radiographic and laboratory data in the context of their medical condition is felt to place them at decreased risk for further clinical deterioration. Furthermore, it is anticipated that the patient will be medically stable for discharge from the hospital within 2 midnights of admission. The following factors support the patient status of observation.   " The patient's presenting symptoms include presyncope. " The physical exam findings include weakness. " The initial radiographic and laboratory data are above.     Jonetta Speak, PA-C  11/23/2016 5:26 PM

## 2016-11-23 NOTE — Progress Notes (Signed)
New pt admission from ED. Pt brought to the floor in stable condition. Vitals taken. Initial Assessment done. All immediate pertinent needs to patient addressed. Patient Guide given to patient. Important safety instructions relating to hospitalization reviewed with patient. Patient verbalized understanding. Will continue to monitor pt.  Prescribed medicines carried out, Pt's asked to bring her home medicine, pharmacy still needs to be verified, pt is asked to call RN to bathroom, refused bed alarm at this point, orthostatic vitals done, pt is unable to get up and only two position is completed (sitting and lying this point), will continue to monitor the patient

## 2016-11-23 NOTE — ED Provider Notes (Signed)
[facility] DEPT Provider Note   CSN: [id number] Arrival date & time: 11/23/16  1526     History   Chief Complaint Chief Complaint  Patient presents with  . Abdominal Pain  . Loss of Consciousness    HPI [name] [name] is a 51 y.o. female presenting via EMS from the cardiologist's office after syncopal episode. Patient reports that she was there to have her Holter monitor removed. She was sitting down and felt her heart racing and attempted to get up to get the doctor and felt lightheaded and had a syncopal episode. She states that she quickly returned to consciousness and felt short of breath and tachypneic. She explains that this has occurred one time before in June with same symptoms and this is why she was on a Holter monitor for 30 days. She had no such episodes while on the Holter monitor. She denies any history of cardiac disease. She denies any chest pain with this and no exertional component. She also endorses a progressive onset squeezing generalized headaches since she's been here in the emergency department. She also reports months of abdominal pain, left lower quadrant pain and constipation for months. She cannot recall her last bowel movement. She denies dysuria, hematuria. Patient is post-menopausal and has had a hysterectomy back in 1989. She states that she does not have any ovaries and is on hormonal therapy. She denies a history of DVT/PE, recent surgery, malignancy, unilateral leg swelling or pain or prolonged immobilization.  HPI  Past Medical History:  Diagnosis Date  . Anxiety   . Asthma   . Bipolar 1 disorder ([place])    ect treatments last treatment Sep 02 1011  . Depression   . GERD (gastroesophageal reflux disease)   . Hypertension   . Seizures ([place])    last seizure was so long ago she can't remember.   . Sleep apnea     Patient Active Problem List   Diagnosis Date Noted  . Near syncope 11/23/2016  . Abdominal pain 09/23/2016  . Anxiety 09/23/2016    . 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[place]) 09/23/2016  . Constipation 09/23/2016  . DDD (degenerative disc disease), lumbosacral 09/23/2016  . Hypertension 09/23/2016  . Migraines 09/23/2016  . Myelopathy ([place]) 09/23/2016  . Nausea 09/23/2016  . Post laminectomy syndrome 09/23/2016  . Pseudoarthrosis of lumbar spine 09/23/2016  . Seizures ([place]) 09/23/2016  . Shortness of breath 09/23/2016  . Sleep apnea 09/23/2016  . Spinal stenosis of lumbar region 09/23/2016  . Vertigo 09/23/2016  . 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 Chronic pain syndrome   . Esophageal reflux   . Depression   . Rash 11/14/2015  . Cellulitis of great toe of right foot 08/12/2015  . Toe ulcer, right ([place]) 04/11/2015  . Cellulitis of toe of right foot 04/11/2015  . Nonspecific chest pain   . Chest pain 10/26/2014  . Morbid obesity due to excess calories ([place]) 10/26/2014  . Hypokalemia 10/26/2014  . Manic bipolar I disorder ([place]) 10/26/2014  . Atypical angina ([place]) 10/26/2014  . Tardive dyskinesia 08/26/2011  . Manic bipolar I disorder with rapid cycling ([place]) 05/18/2011    Class: Acute  . Asthma 02/24/2011  . Chronic pain 02/24/2011  . Essential hypertension 02/24/2011  . History of migraine headaches 02/24/2011    Past Surgical History:  Procedure Laterality Date  . ABDOMINAL HYSTERECTOMY    . BACK SURGERY    . CARPAL TUNNEL RELEASE     x2  . Laproscopic knee surgery    .  PLANTAR FASCIA RELEASE     x2    OB History    No data available       Home Medications    Prior to Admission medications   Medication Sig Start Date End Date Taking? Authorizing Provider  albuterol (PROAIR HFA) 108 (90 Base) MCG/ACT inhaler INHALE 2 PUFFS INTO THE LUNGS EVERY 4 HOURS AS NEEDED FOR WHEEZING 10/11/16  Yes [name], MD  Artificial Tear Ointment (DRY EYES OP) Place 1 drop into both eyes daily as needed.   Yes [provider]  busPIRone (BUSPAR) 5 MG tablet Take 5 mg by mouth 3 (three) times  daily.   Yes [provider]  Calcium Carbonate-Vit D-Min (CALCIUM 1200 PO) Take 1 tablet by mouth daily.   Yes [provider]  citalopram (CELEXA) 40 MG tablet Take 40 mg by mouth at bedtime.  05/28/11  Yes Darrol Jump, MD  cyclobenzaprine (FLEXERIL) 10 MG tablet Take 10 mg by mouth 3 (three) times daily as needed for muscle spasms.   Yes [provider]  EMBEDA 30-1.2 MG CPCR Take 1 capsule by mouth 2 (two) times daily.  10/19/16  Yes [provider]  estradiol (ESTRACE) 1 MG tablet Take 1 mg by mouth daily. 10/29/16  Yes [provider]  fluticasone (FLONASE) 50 MCG/ACT nasal spray Place 2 sprays into both nostrils daily. Patient taking differently: Place 2 sprays into both nostrils daily as needed for allergies.  04/05/16  Yes [name], MD  gabapentin (NEURONTIN) 600 MG tablet Take 1 tablet (600 mg total) by mouth 3 (three) times daily. For anxiety and pain management. May have to stop to take ECT. 05/28/11  Yes Darrol Jump, MD  haloperidol (HALDOL) 5 MG tablet Take 1 tablet (5 mg total) by mouth at bedtime. For psychosis 11/15/15  Yes Barton Dubois, MD  hydrochlorothiazide (HYDRODIURIL) 25 MG tablet TAKE 1 TABLET EVERY DAY Patient taking differently: TAKE 25 mg TABLET EVERY DAY 06/21/16  Yes [name], MD  lubiprostone (AMITIZA) 24 MCG capsule TAKE 1 CAPSULE TWICE A DAY WITH A MEAL Patient taking differently: Take 24 mcg by mouth 2 (two) times daily. TAKE 1 CAPSULE TWICE A DAY WITH A MEAL 11/02/16  Yes [name], MD  meloxicam (MOBIC) 15 MG tablet Take 15 mg by mouth daily. 11/20/16  Yes [provider]  Multiple Vitamins-Minerals (MULTIVITAMIN WITH MINERALS) tablet Take 1 tablet by mouth daily.   Yes [provider]  oxyCODONE-acetaminophen (PERCOCET/ROXICET) 5-325 MG tablet Take 1 tablet by mouth every 4 (four) hours as needed for severe pain.    Yes [name], DPM  pantoprazole (PROTONIX) 40 MG  tablet TAKE 1 TABLET BY MOUTH TWICE A DAY Patient taking differently: TAKE 40 mg TABLET BY MOUTH TWICE A DAY 11/08/16  Yes [name], MD  potassium chloride SA (KLOR-CON M20) 20 MEQ tablet Take 2 tablets (40 mEq total) by mouth daily. 11/02/16  Yes [name], MD  prazosin (MINIPRESS) 2 MG capsule Take 2 mg by mouth daily. 11/08/16  Yes [provider]  prazosin (MINIPRESS) 5 MG capsule Take 5 mg by mouth at bedtime.   Yes [provider]  traZODone (DESYREL) 100 MG tablet Take 100 mg by mouth at bedtime. 07/29/14  Yes [provider]  nitroGLYCERIN (NITROSTAT) 0.4 MG SL tablet Place 1 tablet (0.4 mg total) under the tongue every 5 (five) minutes as needed for chest pain. Patient not taking: Reported on  11/23/2016 10/27/14   Leone Brand, MD  promethazine (PHENERGAN) 25 MG tablet Take 1 tablet (25 mg total) by mouth every 8 (eight) hours as needed for nausea or vomiting. Patient not taking: Reported on 11/23/2016 09/23/16   [name], DPM  SUMAtriptan (IMITREX) 100 MG tablet TAKE 1 TABLET (100 MG TOTAL) BY MOUTH EVERY 2 (TWO) HOURS AS NEEDED. [facility] Patient not taking: Reported on 11/23/2016 05/27/14   [name], MD    Family History Family History  Problem Relation Age of Onset  . Diabetes Mother   . COPD Mother   . Hypertension Mother   . Hyperlipidemia Mother   . Heart disease Father   . Hyperlipidemia Father   . Hypertension Father   . Cancer Father   . Heart disease Brother   . Heart disease Daughter     Social History Social History  Substance Use Topics  . Smoking status: Never Smoker  . Smokeless tobacco: Never Used  . Alcohol use No     Allergies   Tetracyclines & related; Tramadol; Ciprofloxacin; Codeine; and Penicillins   Review of Systems Review of Systems  Constitutional: Negative for chills, diaphoresis and fever.  HENT: Negative for ear pain and sore throat.   Eyes: Negative for pain and visual disturbance.   Respiratory: Positive for shortness of breath. Negative for cough, choking, chest tightness, wheezing and stridor.   Cardiovascular: Positive for palpitations. Negative for chest pain and leg swelling.  Gastrointestinal: Positive for abdominal pain, constipation and nausea. Negative for blood in stool and vomiting.  Genitourinary: Negative for difficulty urinating, dysuria, flank pain, frequency and hematuria.  Musculoskeletal: Positive for joint swelling. Negative for arthralgias and back pain.       Patient reports chronic right knee pain and swelling  Skin: Negative for color change, pallor and rash.  Neurological: Positive for syncope, light-headedness and headaches. Negative for dizziness, tremors, seizures, facial asymmetry, speech difficulty, weakness and numbness.       Patient felt lightheaded just prior to syncopal episode.     Physical Exam Updated Vital Signs BP 112/74   Pulse 90   Temp 98.1 F (36.7 C) (Oral)   Resp 12   SpO2 94%   Physical Exam  Constitutional: She is oriented to person, place, and time. She appears well-developed and well-nourished. No distress.  Afebrile, nontoxic-appearing, lying comfortably in bed in no acute distress.  HENT:  Head: Normocephalic and atraumatic.  Eyes: Pupils are equal, round, and reactive to light. Conjunctivae and EOM are normal. Right eye exhibits no discharge. Left eye exhibits no discharge.  Neck: Normal range of motion. Neck supple. No JVD present.  Cardiovascular: Normal rate, regular rhythm, normal heart sounds and intact distal pulses.   No murmur heard. No lower extremity edema   Pulmonary/Chest: Effort normal and breath sounds normal. No respiratory distress. She has no wheezes. She has no rales. She exhibits no tenderness.  Abdominal: Soft. She exhibits no distension and no mass. There is tenderness. There is no rebound and no guarding.  Tenderness palpation of left lower quadrant  Musculoskeletal: Normal range of  motion. She exhibits no edema, tenderness or deformity.  No tenderness palpation of the calves bilaterally. No unilateral swelling  Neurological: She is alert and oriented to person, place, and time. No cranial nerve deficit or sensory deficit. She exhibits normal muscle tone. Coordination normal.  Neurologic Exam:  - Mental status: Patient is alert and cooperative. Fluent speech and words are clear. Coherent thought processes  and insight is good. Patient is oriented x 4 to person, place, time and event.  - Cranial nerves:  CN III, IV, VI: pupils equally round, reactive to light both direct and conscensual. Full extra-ocular movement. CN VII : muscles of facial expression intact. CN X :  midline uvula. XI strength of sternocleidomastoid and trapezius muscles 5/5, XII: tongue is midline when protruded. - Motor: No involuntary movements. Muscle tone and bulk normal throughout. Muscle strength is 5/5 in bilateral shoulder abduction, elbow flexion and extension, grip, hip extension, flexion, leg flexion and extension, ankle dorsiflexion and plantar flexion.  - Sensory: Proprioception, light tough sensation intact in all extremities.  - cerebellar: rapid alternating movements and point to point movement intact in upper and lower extremities.   Skin: Skin is warm and dry. No rash noted. She is not diaphoretic. No erythema. No pallor.  Psychiatric: She has a normal mood and affect.  Nursing note and vitals reviewed.    ED Treatments / Results  Labs (all labs ordered are listed, but only abnormal results are displayed) Labs Reviewed  CBC - Abnormal; Notable for the following:       Result Value   RBC 3.85 (*)    Hemoglobin 11.8 (*)    HCT 35.1 (*)    All other components within normal limits  COMPREHENSIVE METABOLIC PANEL - Abnormal; Notable for the following:    CO2 21 (*)    Calcium 8.2 (*)    Total Protein 5.8 (*)    Albumin 3.0 (*)    All other components within normal limits  URINALYSIS,  ROUTINE W REFLEX MICROSCOPIC  D-DIMER, QUANTITATIVE (NOT AT [facility])  I-STAT TROPONIN, ED  CBG MONITORING, ED    EKG  EKG Interpretation None       Radiology Dg Chest 2 View  Result Date: 11/23/2016 CLINICAL DATA:  Patient with syncopal episode. Dizziness. Abdominal pain. EXAM: CHEST  2 VIEW COMPARISON:  Chest radiograph 09/23/2016 FINDINGS: Normal cardiac and mediastinal contours. No consolidative pulmonary opacities. No pleural effusion or pneumothorax. Thoracic spinal fusion hardware. IMPRESSION: No acute cardiopulmonary process Electronically Signed   By: Lovey Newcomer M.D.   On: 11/23/2016 18:55   Ct Abdomen Pelvis W Contrast  Result Date: 11/23/2016 CLINICAL DATA:  Patient with constipation and abdominal pain. Evaluate for diverticulitis. EXAM: CT ABDOMEN AND PELVIS WITH CONTRAST TECHNIQUE: Multidetector CT imaging of the abdomen and pelvis was performed using the standard protocol following bolus administration of intravenous contrast. CONTRAST:  118mL ISOVUE-300 IOPAMIDOL (ISOVUE-300) INJECTION 61% COMPARISON:  CT abdomen pelvis 02/06/2015 FINDINGS: Lower chest: Normal heart size. Dependent atelectasis within the bilateral lower lobes. No pleural effusion. Hepatobiliary: Liver is normal in size and contour. Gallbladder is unremarkable. No intrahepatic or extrahepatic biliary ductal dilatation. Pancreas: Unremarkable Spleen: Unremarkable Adrenals/Urinary Tract: Normal adrenal glands. Kidneys enhance symmetrically with contrast. No hydronephrosis. Urinary bladder is unremarkable. Stomach/Bowel: Descending and sigmoid colonic diverticulosis. No CT evidence for acute diverticulitis. No evidence for small bowel obstruction. Normal morphology of the stomach. No free fluid or free intraperitoneal air. Postsurgical clips within the right lower quadrant. Vascular/Lymphatic: Normal caliber abdominal aorta. No retroperitoneal lymphadenopathy. Reproductive: Status posthysterectomy. Other: None.  Musculoskeletal: Thoracolumbar spinal fusion hardware. Mild height loss of the T9 vertebral body. IMPRESSION: No acute process within the abdomen or pelvis. Mild height loss of the T9 vertebral body. Recommend correlation for point tenderness. Electronically Signed   By: Lovey Newcomer M.D.   On: 11/23/2016 17:56    Procedures Procedures (including critical care  time)  Medications Ordered in ED Medications  albuterol (PROVENTIL) (2.5 MG/3ML) 0.083% nebulizer solution 2.5 mg (not administered)  busPIRone (BUSPAR) tablet 5 mg (not administered)  citalopram (CELEXA) tablet 40 mg (not administered)  Morphine-Naltrexone 30-1.2 MG CPCR 1 capsule (not administered)  estradiol (ESTRACE) tablet 1 mg (not administered)  fluticasone (FLONASE) 50 MCG/ACT nasal spray 2 spray (not administered)  gabapentin (NEURONTIN) tablet 600 mg (not administered)  haloperidol (HALDOL) tablet 5 mg (not administered)  hydrochlorothiazide (HYDRODIURIL) tablet 25 mg (not administered)  lubiprostone (AMITIZA) capsule 24 mcg (not administered)  multivitamin with minerals tablet 1 tablet (not administered)  oxyCODONE-acetaminophen (PERCOCET/ROXICET) 5-325 MG per tablet 1 tablet (not administered)  pantoprazole (PROTONIX) EC tablet 40 mg (not administered)  potassium chloride SA (K-DUR,KLOR-CON) CR tablet 40 mEq (not administered)  prazosin (MINIPRESS) capsule 2 mg (not administered)  prazosin (MINIPRESS) capsule 5 mg (not administered)  traZODone (DESYREL) tablet 100 mg (not administered)  fentaNYL (SUBLIMAZE) injection 50 mcg (50 mcg Intravenous Given 11/23/16 1648)  ondansetron (ZOFRAN) injection 4 mg (4 mg Intravenous Given 11/23/16 1648)  iopamidol (ISOVUE-300) 61 % injection (100 mLs  Contrast Given 11/23/16 1721)     Initial Impression / Assessment and Plan / ED Course  I have reviewed the triage vital signs and the nursing notes.  Pertinent labs & imaging results that were available during my care of the patient were  reviewed by me and considered in my medical decision making (see chart for details).  Patient presents from Dr. Quay Burow cardiology office with presyncopal episode per Dr. Kennon Holter note. She was there for follow-up.  EKG normal and vital signs normal. He had her sent over for direct admission and workup.  Consulted cardiology  16:50-- Spoke to cardiology who reports that they are aware of the patient and tried to have her directly admitted but no beds were available. She had to come through the emergency department. she is currently working on the H&P and will be admitting patient for observation.  Patient will be admitted for observation by cardiology.  Final Clinical Impressions(s) / ED Diagnoses   Final diagnoses:  Syncope and collapse  Left lower quadrant pain    New Prescriptions New Prescriptions   No medications on file     Dossie Der 11/23/16 1910    Daleen Bo, MD 11/24/16 1318

## 2016-11-24 DIAGNOSIS — I10 Essential (primary) hypertension: Secondary | ICD-10-CM | POA: Diagnosis not present

## 2016-11-24 DIAGNOSIS — E876 Hypokalemia: Secondary | ICD-10-CM | POA: Diagnosis not present

## 2016-11-24 DIAGNOSIS — G43009 Migraine without aura, not intractable, without status migrainosus: Secondary | ICD-10-CM

## 2016-11-24 DIAGNOSIS — R55 Syncope and collapse: Secondary | ICD-10-CM | POA: Diagnosis not present

## 2016-11-24 LAB — TROPONIN I
Troponin I: <0.03 ng/mL (ref <0.03)
Troponin I: <0.03 ng/mL (ref <0.03)

## 2016-11-24 LAB — HIV ANTIBODY (ROUTINE TESTING W REFLEX): HIV Screen 4th Generation wRfx: NONREACTIVE

## 2016-11-24 MED ORDER — ACETAMINOPHEN 325 MG PO TABS: 650.0000 mg | ORAL_TABLET | Freq: Four times a day (QID) | ORAL | Status: DC | PRN

## 2016-11-24 MED ORDER — HYDROMORPHONE HCL 1 MG/ML IJ SOLN
1.0000 mg | Freq: Once | INTRAMUSCULAR | Status: AC
  Administered 2016-11-24: 1 mg via INTRAVENOUS
  Filled 2016-11-24: qty 1

## 2016-11-24 MED ORDER — KETOROLAC TROMETHAMINE 30 MG/ML IJ SOLN
30.0000 mg | Freq: Once | INTRAMUSCULAR | Status: AC
  Administered 2016-11-24: 30 mg via INTRAVENOUS
  Filled 2016-11-24: qty 1

## 2016-11-24 MED ORDER — POLYETHYLENE GLYCOL 3350 17 G PO PACK
17.0000 g | PACK | Freq: Every day | ORAL | Status: DC
  Administered 2016-11-24 – 2016-11-25 (×2): 17 g via ORAL
  Filled 2016-11-24 (×2): qty 1

## 2016-11-24 MED ORDER — BUTALBITAL-APAP-CAFFEINE 50-325-40 MG PO TABS
1.0000 | ORAL_TABLET | Freq: Four times a day (QID) | ORAL | Status: DC | PRN
  Administered 2016-11-24 – 2016-11-25 (×3): 1 via ORAL
  Filled 2016-11-24 (×4): qty 1

## 2016-11-24 NOTE — Progress Notes (Signed)
Progress Note  Patient Name: [name] [name] Date of Encounter: 11/24/2016  Primary Cardiologist: Dr. [name]  Subjective   Complains of headache.  She does not typically had headaches. She did not hit her head yesterday.  She also complains of constipation. Prior to yesterday she was eating and drinking well. This morning she has no appetite and feels nauseous.  Inpatient Medications    Scheduled Meds: . busPIRone  5 mg Oral TID  . citalopram  40 mg Oral QHS  . enoxaparin (LOVENOX) injection  40 mg Subcutaneous Q24H  . estradiol  1 mg Oral Daily  . fluticasone  2 spray Each Nare Daily  . gabapentin  600 mg Oral TID  . haloperidol  5 mg Oral QHS  . hydrochlorothiazide  25 mg Oral Daily  . ketorolac  30 mg Intravenous Once  . lubiprostone  24 mcg Oral BID  . Morphine-Naltrexone  1 capsule Oral BID  . multivitamin with minerals  1 tablet Oral Daily  . pantoprazole  40 mg Oral BID  . polyethylene glycol  17 g Oral Daily  . potassium chloride SA  40 mEq Oral Daily  . prazosin  2 mg Oral Daily  . prazosin  5 mg Oral QHS  . traZODone  100 mg Oral QHS   Continuous Infusions:  PRN Meds: acetaminophen, albuterol, ALPRAZolam, butalbital-acetaminophen-caffeine, gi cocktail, ondansetron (ZOFRAN) IV, oxyCODONE-acetaminophen, zolpidem   Vital Signs    Vitals:   11/23/16 1930 11/23/16 2116 11/24/16 0447 11/24/16 0453  BP: 132/77 138/80  103/61  Pulse: 90 90  74  Resp: 12   20  Temp:  98 F (36.7 C)  98.1 F (36.7 C)  TempSrc:  Oral  Oral  SpO2: 91% 92%  92%  Weight:  108.4 kg (238 lb 14.4 oz) 107 kg (235 lb 14.4 oz)   Height:  5\' 4"  (1.626 m)      Intake/Output Summary (Last 24 hours) at 11/24/16 0951 Last data filed at 11/24/16 0911  Gross per 24 hour  Intake              240 ml  Output              600 ml  Net             -360 ml   Filed Weights   11/23/16 2116 11/24/16 0447  Weight: 108.4 kg (238 lb 14.4 oz) 107 kg (235 lb 14.4 oz)    Telemetry    Sinus  rhythm. No events.  Personally Reviewed  ECG    Sinus rhythm. Rate 71 bpm.  Personally Reviewed  Physical Exam   GEN: Clearly uncomfortable and squinting from the sun. Neck: No JVD Cardiac: RRR, no murmurs, rubs, or gallops.  Respiratory: Clear to auscultation bilaterally. GI: Soft, nontender, non-distended  MS: No edema; No deformity. Neuro:  Nonfocal  Psych: Normal affect   Labs    Chemistry Recent Labs Lab 11/18/16 1522 11/23/16 1558  NA 138 139  K 4.5 3.7  CL 103 108  CO2 27 21*  GLUCOSE 92 84  BUN 8 7  CREATININE 0.62 0.53  CALCIUM 9.3 8.2*  PROT 6.4 5.8*  ALBUMIN 3.7 3.0*  AST 19 25  ALT 17 16  ALKPHOS 84 61  BILITOT 0.3 0.5  GFRNONAA >89 >60  GFRAA >89 >60  ANIONGAP  --  10     Hematology Recent Labs Lab 11/18/16 1522 11/23/16 1543  WBC 6.3 6.9  RBC 3.82 3.85*  HGB 11.9* 11.8*  HCT 36.3 35.1*  MCV 95.0 91.2  MCH 31.2 30.6  MCHC 32.8 33.6  RDW 15.3* 14.8  PLT 309 286    Cardiac Enzymes Recent Labs Lab 11/23/16 2207 11/24/16 0400  TROPONINI <0.03 <0.03    Recent Labs Lab 11/23/16 1648  TROPIPOC 0.00     BNPNo results for input(s): BNP, PROBNP in the last 168 hours.   DDimer  Recent Labs Lab 11/23/16 1913  DDIMER 0.62*     Radiology    Dg Chest 2 View  Result Date: 11/23/2016 CLINICAL DATA:  Patient with syncopal episode. Dizziness. Abdominal pain. EXAM: CHEST  2 VIEW COMPARISON:  Chest radiograph 09/23/2016 FINDINGS: Normal cardiac and mediastinal contours. No consolidative pulmonary opacities. No pleural effusion or pneumothorax. Thoracic spinal fusion hardware. IMPRESSION: No acute cardiopulmonary process Electronically Signed   By: Lovey Newcomer M.D.   On: 11/23/2016 18:55   Ct Abdomen Pelvis W Contrast  Result Date: 11/23/2016 CLINICAL DATA:  Patient with constipation and abdominal pain. Evaluate for diverticulitis. EXAM: CT ABDOMEN AND PELVIS WITH CONTRAST TECHNIQUE: Multidetector CT imaging of the abdomen and pelvis was  performed using the standard protocol following bolus administration of intravenous contrast. CONTRAST:  182mL ISOVUE-300 IOPAMIDOL (ISOVUE-300) INJECTION 61% COMPARISON:  CT abdomen pelvis 02/06/2015 FINDINGS: Lower chest: Normal heart size. Dependent atelectasis within the bilateral lower lobes. No pleural effusion. Hepatobiliary: Liver is normal in size and contour. Gallbladder is unremarkable. No intrahepatic or extrahepatic biliary ductal dilatation. Pancreas: Unremarkable Spleen: Unremarkable Adrenals/Urinary Tract: Normal adrenal glands. Kidneys enhance symmetrically with contrast. No hydronephrosis. Urinary bladder is unremarkable. Stomach/Bowel: Descending and sigmoid colonic diverticulosis. No CT evidence for acute diverticulitis. No evidence for small bowel obstruction. Normal morphology of the stomach. No free fluid or free intraperitoneal air. Postsurgical clips within the right lower quadrant. Vascular/Lymphatic: Normal caliber abdominal aorta. No retroperitoneal lymphadenopathy. Reproductive: Status posthysterectomy. Other: None. Musculoskeletal: Thoracolumbar spinal fusion hardware. Mild height loss of the T9 vertebral body. IMPRESSION: No acute process within the abdomen or pelvis. Mild height loss of the T9 vertebral body. Recommend correlation for point tenderness. Electronically Signed   By: Lovey Newcomer M.D.   On: 11/23/2016 17:56    Cardiac Studies   Lexiscan Myoview 10/13/16:  Nuclear stress EF: 67%.  The left ventricular ejection fraction is hyperdynamic (>65%).  There was no ST segment deviation noted during stress.  The study is normal.   1. EF 67%, normal wall motion.  2. Fixed small, mild mid anterior perfusion defect.  Given normal wall motion, suspect soft tissue attenuation.     Patient Profile     51 y.o. female with palpitations, asthma, GERD, and OSA here with syncope.   Assessment & Plan    # Near syncope:  [name] [name] had a near syncopal event yesterday.  She was not orthostatic in the emergency department he appears to be euvolemic. Her stress test was low risk and revealed normal systolic function. Telemetry here has been unremarkable. She may need to see if he has an outpatient for consideration of a loop recorder. She did report tachycardia palpitations prior to her event yesterday, but no chest pain or pressure.  30 day event monitor was unrevealing.  # Headache: There was no head trauma in her near syncopal episode yesterday. She reports photophobia. We will give a dose of Fioricet and Toradol 30 mg IV 1.   # Constipation: Miralax  Stable for discharge later today once her pain is under better control  Signed,  Skeet Latch, MD  11/24/2016, 9:51 AM

## 2016-11-24 NOTE — Progress Notes (Signed)
Pt slept well at night, percocet given for complain of headache once, otherwise vitals stable, no any complain of chest pain, SOB and distress, will continue to monitor

## 2016-11-24 NOTE — Progress Notes (Addendum)
    Patient was going to be discharged this evening but she had a headache so after discussing with Dr. Oval Linsey, will give pain medications and discharge in morning. Discharge note pended to be used tomorrow. Discharge  Orders cancelled for now.  Linus Mako   PA-C

## 2016-11-24 NOTE — Care Management Note (Signed)
Case Management Note  Patient Details  Name: [name] [name] MRN: [id number] Date of Birth: [birth year]  Subjective/Objective:                 Patient in observation for near syncope in MD office. From home w son.    Action/Plan:  CM will continue to follow for DC planning needs.  Expected Discharge Date:                  Expected Discharge Plan:  Home/Self Care  In-House Referral:     Discharge planning Services  CM Consult  Post Acute Care Choice:    Choice offered to:     DME Arranged:    DME Agency:     HH Arranged:    HH Agency:     Status of Service:  In process, will continue to follow  If discussed at Long Length of Stay Meetings, dates discussed:    Additional Comments:  Carles Collet, RN 11/24/2016, 10:17 AM

## 2016-11-24 NOTE — Progress Notes (Signed)
Pt is nauseous BM in 1 week. Iv Zofran given

## 2016-11-24 NOTE — Discharge Summary (Signed)
Patient ID: [name] [name],  MRN: [id number], DOB/AGE: 07/20/1965 51 y.o.  Admit date: 11/23/2016 Discharge date: 11/25/2016  Primary Care Provider: [name], MD Primary Cardiologist: Dr berry  Discharge Diagnoses Active Problems:   Depression   Essential hypertension   Near syncope   Obesity   Chronic apin  Hospital Course;  51 y/o obese female referred to Dr [name] in June 2018 for chest pain and plantations. A Myoview done as an OP was low risk and an event monitor done showed no arrhthymias. She was in the office for follow up 11/23/16 when she had a near syncopal spell in the lobby. There was no documented arrhythmia and she was not hypotensive. She was admitted to [facility] via EMS from the office for observation. Her telemetry has been normal. She requested to stay one extra night in the hospital because of a headache. She has been seen this AM, she has ambulated without further incidence and Dr Oval Linsey feels she can be discharged.   Discharge Vitals:  Blood pressure (!) 107/59, pulse 64, temperature 97.7 F (36.5 C), temperature source Oral, resp. rate 18, height 5\' 4"  (1.626 m), weight 236 lb 1.6 oz (107.1 kg), SpO2 92 %.    Labs: Results for orders placed or performed during the hospital encounter of 11/23/16 (from the past 24 hour(s))  Troponin I-serum (0, 3, 6 hours)     Status: None   Collection Time: 11/24/16  9:43 AM  Result Value Ref Range   Troponin I <0.03 <0.03 ng/mL    Disposition:  Follow-up Information    Barrett, Evelene Croon, PA-C Follow up.   Specialties:  Cardiology, Radiology Why:  office will contact you Contact information: [address] [place] [address] [phone]           Discharge Medications:  Allergies as of 11/25/2016      Reactions   Tetracyclines & Related Other (See Comments)   syncope   Tramadol Other (See Comments)   seizures   Ciprofloxacin Rash   Codeine Itching   Penicillins Hives, Rash   Has  patient had a PCN reaction causing immediate rash, facial/tongue/throat swelling, SOB or lightheadedness with hypotension: No Has patient had a PCN reaction causing severe rash involving mucus membranes or skin necrosis: No Has patient had a PCN reaction that required hospitalization: No Has patient had a PCN reaction occurring within the last 10 years: No If all of the above answers are "NO", then may proceed with Cephalosporin use.      Medication List    STOP taking these medications   nitroGLYCERIN 0.4 MG SL tablet Commonly known as:  NITROSTAT   promethazine 25 MG tablet Commonly known as:  PHENERGAN   SUMAtriptan 100 MG tablet Commonly known as:  IMITREX     TAKE these medications   acetaminophen 325 MG tablet Commonly known as:  TYLENOL Take 2 tablets (650 mg total) by mouth every 6 (six) hours as needed for mild pain or headache.   albuterol 108 (90 Base) MCG/ACT inhaler Commonly known as:  PROAIR HFA INHALE 2 PUFFS INTO THE LUNGS EVERY 4 HOURS AS NEEDED FOR WHEEZING   busPIRone 5 MG tablet Commonly known as:  BUSPAR Take 5 mg by mouth 3 (three) times daily.   CALCIUM 1200 PO Take 1 tablet by mouth daily.   citalopram 40 MG tablet Commonly known as:  CELEXA Take 40 mg by mouth at bedtime.   cyclobenzaprine 10 MG tablet  Commonly known as:  FLEXERIL Take 10 mg by mouth 3 (three) times daily as needed for muscle spasms.   DRY EYES OP Place 1 drop into both eyes daily as needed.   EMBEDA 30-1.2 MG Cpcr Generic drug:  Morphine-Naltrexone Take 1 capsule by mouth 2 (two) times daily.   estradiol 1 MG tablet Commonly known as:  ESTRACE Take 1 mg by mouth daily.   fluticasone 50 MCG/ACT nasal spray Commonly known as:  FLONASE Place 2 sprays into both nostrils daily. What changed:  when to take this  reasons to take this   gabapentin 600 MG tablet Commonly known as:  NEURONTIN Take 1 tablet (600 mg total) by mouth 3 (three) times daily. For anxiety and  pain management. May have to stop to take ECT.   haloperidol 5 MG tablet Commonly known as:  HALDOL Take 1 tablet (5 mg total) by mouth at bedtime. For psychosis   hydrochlorothiazide 25 MG tablet Commonly known as:  HYDRODIURIL TAKE 1 TABLET EVERY DAY What changed:  See the new instructions.   lubiprostone 24 MCG capsule Commonly known as:  AMITIZA TAKE 1 CAPSULE TWICE A DAY WITH A MEAL What changed:  how much to take  how to take this  when to take this  additional instructions   meloxicam 15 MG tablet Commonly known as:  MOBIC Take 15 mg by mouth daily.   multivitamin with minerals tablet Take 1 tablet by mouth daily.   oxyCODONE-acetaminophen 5-325 MG tablet Commonly known as:  PERCOCET/ROXICET Take 1 tablet by mouth every 4 (four) hours as needed for severe pain.   pantoprazole 40 MG tablet Commonly known as:  PROTONIX TAKE 1 TABLET BY MOUTH TWICE A DAY What changed:  See the new instructions.   potassium chloride SA 20 MEQ tablet Commonly known as:  KLOR-CON M20 Take 2 tablets (40 mEq total) by mouth daily.   prazosin 2 MG capsule Commonly known as:  MINIPRESS Take 2 mg by mouth daily.   prazosin 5 MG capsule Commonly known as:  MINIPRESS Take 5 mg by mouth at bedtime.   traZODone 100 MG tablet Commonly known as:  DESYREL Take 100 mg by mouth at bedtime.        Duration of Discharge Encounter: Greater than 30 minutes including physician time.  Signed, Delos Haring,  PA-C  11/25/2016 9:16 AM

## 2016-11-24 NOTE — Progress Notes (Signed)
Pt is in bed with eyes covered rates head ache at 8 out of 10 with PRN given around the clock no bm for 1 week and not eating

## 2016-11-24 NOTE — Progress Notes (Signed)
Paged MD to clarify D/c Order

## 2016-11-25 ENCOUNTER — Telehealth: Payer: Self-pay | Admitting: Cardiovascular Disease

## 2016-11-25 DIAGNOSIS — R55 Syncope and collapse: Secondary | ICD-10-CM | POA: Diagnosis not present

## 2016-11-25 DIAGNOSIS — I10 Essential (primary) hypertension: Secondary | ICD-10-CM

## 2016-11-25 MED ORDER — BUTALBITAL-APAP-CAFFEINE 50-325-40 MG PO TABS: 1.0000 | ORAL_TABLET | Freq: Four times a day (QID) | ORAL | 0 refills | Status: DC | PRN

## 2016-11-25 MED ORDER — ONDANSETRON HCL 4 MG PO TABS: 4.0000 mg | ORAL_TABLET | Freq: Every day | ORAL | 1 refills | Status: DC | PRN

## 2016-11-25 MED ORDER — ACETAMINOPHEN 325 MG PO TABS: 650.0000 mg | ORAL_TABLET | ORAL | Status: DC | PRN

## 2016-11-25 NOTE — Progress Notes (Signed)
Pt is alert and oriented, vitals stable,  slight drowsiness from pain medication upon awakening sat on side of  Bed before walking to the bathroom and back PA Paged for update plan for discharge need prescriptions for headaches

## 2016-11-25 NOTE — Telephone Encounter (Signed)
Closed Encounter  °

## 2016-11-25 NOTE — Progress Notes (Signed)
Progress Note  Patient Name: [name] [name] Date of Encounter: 11/25/2016  Primary Cardiologist: Dr. [name]  Subjective   Headache is better. Denies chest pain and is able to eat.   Inpatient Medications    Scheduled Meds: . busPIRone  5 mg Oral TID  . citalopram  40 mg Oral QHS  . enoxaparin (LOVENOX) injection  40 mg Subcutaneous Q24H  . estradiol  1 mg Oral Daily  . fluticasone  2 spray Each Nare Daily  . gabapentin  600 mg Oral TID  . haloperidol  5 mg Oral QHS  . hydrochlorothiazide  25 mg Oral Daily  . lubiprostone  24 mcg Oral BID  . Morphine-Naltrexone  1 capsule Oral BID  . multivitamin with minerals  1 tablet Oral Daily  . pantoprazole  40 mg Oral BID  . polyethylene glycol  17 g Oral Daily  . potassium chloride SA  40 mEq Oral Daily  . prazosin  2 mg Oral Daily  . prazosin  5 mg Oral QHS  . traZODone  100 mg Oral QHS   Continuous Infusions:  PRN Meds: acetaminophen, albuterol, ALPRAZolam, butalbital-acetaminophen-caffeine, gi cocktail, ondansetron (ZOFRAN) IV, oxyCODONE-acetaminophen, zolpidem   Vital Signs    Vitals:   11/24/16 1818 11/24/16 2230 11/25/16 0443 11/25/16 0800  BP: 108/66 103/72 104/65 (!) 107/59  Pulse: 87 (!) 127 67 64  Resp:  20 20 18   Temp: 98.7 F (37.1 C) 98.2 F (36.8 C) 98.6 F (37 C) 97.7 F (36.5 C)  TempSrc: Oral Oral Oral Oral  SpO2:  95% 97% 92%  Weight:   107.1 kg (236 lb 1.6 oz)   Height:        Intake/Output Summary (Last 24 hours) at 11/25/16 0859 Last data filed at 11/24/16 2231  Gross per 24 hour  Intake              360 ml  Output              300 ml  Net               60 ml   Filed Weights   11/23/16 2116 11/24/16 0447 11/25/16 0443  Weight: 108.4 kg (238 lb 14.4 oz) 107 kg (235 lb 14.4 oz) 107.1 kg (236 lb 1.6 oz)    Telemetry    Sinus rhythm. No events.  Personally Reviewed  ECG    Sinus rhythm. Rate 71 bpm.  Personally Reviewed  Physical Exam   VS:  BP (!) 107/59 (BP Location: Right Arm)    Pulse 64   Temp 97.7 F (36.5 C) (Oral)   Resp 18   Ht 5\' 4"  (1.626 m)   Wt 107.1 kg (236 lb 1.6 oz)   SpO2 92%   BMI 40.53 kg/m  , BMI Body mass index is 40.53 kg/m. GENERAL:  Well appearing HEENT: Pupils equal round and reactive, fundi not visualized, oral mucosa unremarkable NECK:  No jugular venous distention, waveform within normal limits, carotid upstroke brisk and symmetric, no bruits LUNGS:  Clear to auscultation bilaterally.  No crackles, wheezes or rhonchi HEART:  RRR.  PMI not displaced or sustained,S1 and S2 within normal limits, no S3, no S4, no clicks, no rubs, no murmurs ABD:  Flat, positive bowel sounds normal in frequency in pitch, no bruits, no rebound, no guarding, no midline pulsatile mass, no hepatomegaly, no splenomegaly EXT:  2 plus pulses throughout, no edema, no cyanosis no clubbing SKIN:  No rashes no nodules NEURO:  Cranial nerves II through XII grossly intact, motor grossly intact throughout [facility]:  Cognitively intact, oriented to person place and time   Labs    Chemistry  Recent Labs Lab 11/18/16 1522 11/23/16 1558  NA 138 139  K 4.5 3.7  CL 103 108  CO2 27 21*  GLUCOSE 92 84  BUN 8 7  CREATININE 0.62 0.53  CALCIUM 9.3 8.2*  PROT 6.4 5.8*  ALBUMIN 3.7 3.0*  AST 19 25  ALT 17 16  ALKPHOS 84 61  BILITOT 0.3 0.5  GFRNONAA >89 >60  GFRAA >89 >60  ANIONGAP  --  10     Hematology  Recent Labs Lab 11/18/16 1522 11/23/16 1543  WBC 6.3 6.9  RBC 3.82 3.85*  HGB 11.9* 11.8*  HCT 36.3 35.1*  MCV 95.0 91.2  MCH 31.2 30.6  MCHC 32.8 33.6  RDW 15.3* 14.8  PLT 309 286    Cardiac Enzymes  Recent Labs Lab 11/23/16 2207 11/24/16 0400 11/24/16 0943  TROPONINI <0.03 <0.03 <0.03     Recent Labs Lab 11/23/16 1648  TROPIPOC 0.00     BNPNo results for input(s): BNP, PROBNP in the last 168 hours.   DDimer   Recent Labs Lab 11/23/16 1913  DDIMER 0.62*     Radiology    Dg Chest 2 View  Result Date: 11/23/2016 CLINICAL  DATA:  Patient with syncopal episode. Dizziness. Abdominal pain. EXAM: CHEST  2 VIEW COMPARISON:  Chest radiograph 09/23/2016 FINDINGS: Normal cardiac and mediastinal contours. No consolidative pulmonary opacities. No pleural effusion or pneumothorax. Thoracic spinal fusion hardware. IMPRESSION: No acute cardiopulmonary process Electronically Signed   By: Lovey Newcomer M.D.   On: 11/23/2016 18:55   Ct Abdomen Pelvis W Contrast  Result Date: 11/23/2016 CLINICAL DATA:  Patient with constipation and abdominal pain. Evaluate for diverticulitis. EXAM: CT ABDOMEN AND PELVIS WITH CONTRAST TECHNIQUE: Multidetector CT imaging of the abdomen and pelvis was performed using the standard protocol following bolus administration of intravenous contrast. CONTRAST:  122mL ISOVUE-300 IOPAMIDOL (ISOVUE-300) INJECTION 61% COMPARISON:  CT abdomen pelvis 02/06/2015 FINDINGS: Lower chest: Normal heart size. Dependent atelectasis within the bilateral lower lobes. No pleural effusion. Hepatobiliary: Liver is normal in size and contour. Gallbladder is unremarkable. No intrahepatic or extrahepatic biliary ductal dilatation. Pancreas: Unremarkable Spleen: Unremarkable Adrenals/Urinary Tract: Normal adrenal glands. Kidneys enhance symmetrically with contrast. No hydronephrosis. Urinary bladder is unremarkable. Stomach/Bowel: Descending and sigmoid colonic diverticulosis. No CT evidence for acute diverticulitis. No evidence for small bowel obstruction. Normal morphology of the stomach. No free fluid or free intraperitoneal air. Postsurgical clips within the right lower quadrant. Vascular/Lymphatic: Normal caliber abdominal aorta. No retroperitoneal lymphadenopathy. Reproductive: Status posthysterectomy. Other: None. Musculoskeletal: Thoracolumbar spinal fusion hardware. Mild height loss of the T9 vertebral body. IMPRESSION: No acute process within the abdomen or pelvis. Mild height loss of the T9 vertebral body. Recommend correlation for point  tenderness. Electronically Signed   By: Lovey Newcomer M.D.   On: 11/23/2016 17:56    Cardiac Studies   Lexiscan Myoview 10/13/16:  Nuclear stress EF: 67%.  The left ventricular ejection fraction is hyperdynamic (>65%).  There was no ST segment deviation noted during stress.  The study is normal.   1. EF 67%, normal wall motion.  2. Fixed small, mild mid anterior perfusion defect.  Given normal wall motion, suspect soft tissue attenuation.     Patient Profile     51 y.o. female with palpitations, asthma, GERD, and OSA here with syncope.   Assessment &  Plan    # Near syncope:  Ms. Kirksey had a near syncopal event at the office. She was not orthostatic in the emergency department. Her stress test was low risk and revealed normal systolic function. Telemetry here has been unremarkable. She may need to see EP an outpatient for consideration of a loop recorder. She did report tachycardia palpitations prior to her event yesterday, but no chest pain or pressure.  30 day event monitor was unrevealing.  Telemetry has not shown any arrhythmias.    # Headache: There was no head trauma in her near syncopal episode yesterday. She reports photophobia.  Did not resolve with Fioricet Toradol 30 mg IV 1.  Improved with diluadid and she is ready for d/c.   Signed, Skeet Latch, MD  11/25/2016, 8:59 AM

## 2016-11-29 ENCOUNTER — Other Ambulatory Visit: Payer: Medicare Other

## 2016-11-29 NOTE — Addendum Note (Signed)
Addended by: Milderd Meager on: 11/29/2016 10:16 AM   Modules accepted: Orders

## 2016-12-06 ENCOUNTER — Ambulatory Visit (INDEPENDENT_AMBULATORY_CARE_PROVIDER_SITE_OTHER): Payer: 59 | Admitting: Family Medicine

## 2016-12-06 VITALS — BP 134/86 | HR 84 | Temp 98.3°F | Wt 240.0 lb

## 2016-12-06 DIAGNOSIS — R233 Spontaneous ecchymoses: Secondary | ICD-10-CM | POA: Diagnosis not present

## 2016-12-06 LAB — CBC WITH DIFFERENTIAL/PLATELET
Basophils Absolute: 0 cells/uL (ref 0–200)
Basophils Relative: 0 %
Eosinophils Absolute: 110 cells/uL (ref 15–500)
Eosinophils Relative: 2 %
HCT: 39.3 % (ref 35.0–45.0)
Hemoglobin: 13 g/dL (ref 12.0–15.0)
Lymphocytes Relative: 36 %
Lymphs Abs: 1980 cells/uL (ref 850–3900)
MCH: 31.3 pg (ref 27.0–33.0)
MCHC: 33.1 g/dL (ref 32.0–36.0)
MCV: 94.7 fL (ref 80.0–100.0)
MPV: 8.5 fL (ref 7.5–12.5)
Monocytes Absolute: 330 cells/uL (ref 200–950)
Monocytes Relative: 6 %
Neutro Abs: 3080 cells/uL (ref 1500–7800)
Neutrophils Relative %: 56 %
Platelets: 315 10*3/uL (ref 140–400)
RBC: 4.15 MIL/uL (ref 3.80–5.10)
RDW: 15.8 % — ABNORMAL HIGH (ref 11.0–15.0)
WBC: 5.5 10*3/uL (ref 3.8–10.8)

## 2016-12-06 MED ORDER — BUTALBITAL-APAP-CAFFEINE 50-325-40 MG PO TABS
1.0000 | ORAL_TABLET | Freq: Four times a day (QID) | ORAL | 0 refills | Status: DC | PRN
Start: 2016-12-06 — End: 2017-04-27

## 2016-12-06 NOTE — Progress Notes (Signed)
Subjective:    Patient ID: [name] [name], female    DOB: April 25, 1965, 51 y.o.   MRN: [id number]  HPI Recently admitted to the hospital with syncope. I have copied relevant portions of the discharge summary and included them below for my reference:  Admit date: 11/23/2016 Discharge date: 11/25/2016  Primary Care Provider: [name], MD Primary Cardiologist: Dr berry  Discharge Diagnoses Active Problems:   Depression   Essential hypertension   Near syncope   Obesity   Chronic apin  Hospital Course;  51 y/o obese female referred to Dr [name] in June 2018 for chest pain and plantations. A Myoview done as an OP was low risk and an event monitor done showed no arrhthymias. She was in the office for follow up 11/23/16 when she had a near syncopal spell in the lobby. There was no documented arrhythmia and she was not hypotensive. She was admitted to [facility] via EMS from the office for observation. Her telemetry has been normal. She requested to stay one extra night in the hospital because of a headache. She has been seen this AM, she has ambulated without further incidence and Dr Oval Linsey feels she can be discharged.   She is here today for follow-up She reports daily headache. She is taking Fioricet on a daily basis sometimes twice a day for headaches. She also reports severe constipation unrelieved by amitiza, miralax, or movantik.  She is also concerned by rash on both shins. There are several pinpoint less than 1 mm red dots on both shins appear to be petechiae. They're nonblanching. She has a long-standing history of migraines. She has never tried Topamax to her knowledge.    Past Medical History:  Diagnosis Date  . Anxiety   . Asthma   . Bipolar 1 disorder ([name])    ect treatments last treatment Sep 02 1011  . Depression   . GERD (gastroesophageal reflux disease)   . Hypertension   . Seizures ([place])    last seizure was so long ago she can't remember.   . Sleep apnea     Past Surgical History:  Procedure Laterality Date  . ABDOMINAL HYSTERECTOMY    . BACK SURGERY    . CARPAL TUNNEL RELEASE     x2  . Laproscopic knee surgery    . PLANTAR FASCIA RELEASE     x2   Current Outpatient Prescriptions on File Prior to Visit  Medication Sig Dispense Refill  . acetaminophen (TYLENOL) 325 MG tablet Take 2 tablets (650 mg total) by mouth every 6 (six) hours as needed for mild pain or headache.    . albuterol (PROAIR HFA) 108 (90 Base) MCG/ACT inhaler INHALE 2 PUFFS INTO THE LUNGS EVERY 4 HOURS AS NEEDED FOR WHEEZING 8.5 Inhaler 3  . Artificial Tear Ointment (DRY EYES OP) Place 1 drop into both eyes daily as needed.    . busPIRone (BUSPAR) 5 MG tablet Take 5 mg by mouth 3 (three) times daily.    . butalbital-acetaminophen-caffeine (FIORICET, ESGIC) 50-325-40 MG tablet Take 1 tablet by mouth every 6 (six) hours as needed for headache or migraine. 14 tablet 0  . Calcium Carbonate-Vit D-Min (CALCIUM 1200 PO) Take 1 tablet by mouth daily.    . citalopram (CELEXA) 40 MG tablet Take 40 mg by mouth at bedtime.     . cyclobenzaprine (FLEXERIL) 10 MG tablet Take 10 mg by mouth 3 (three) times daily as needed for muscle spasms.    . EMBEDA 30-1.2 MG CPCR  Take 1 capsule by mouth 2 (two) times daily.     Marland Kitchen estradiol (ESTRACE) 1 MG tablet Take 1 mg by mouth daily.  0  . fluticasone (FLONASE) 50 MCG/ACT nasal spray Place 2 sprays into both nostrils daily. (Patient taking differently: Place 2 sprays into both nostrils daily as needed for allergies. ) 48 g 2  . gabapentin (NEURONTIN) 600 MG tablet Take 1 tablet (600 mg total) by mouth 3 (three) times daily. For anxiety and pain management. May have to stop to take ECT. 90 tablet 0  . haloperidol (HALDOL) 5 MG tablet Take 1 tablet (5 mg total) by mouth at bedtime. For psychosis    . hydrochlorothiazide (HYDRODIURIL) 25 MG tablet TAKE 1 TABLET EVERY DAY (Patient taking differently: TAKE 25 mg TABLET EVERY DAY) 90 tablet 3  .  lubiprostone (AMITIZA) 24 MCG capsule TAKE 1 CAPSULE TWICE A DAY WITH A MEAL (Patient taking differently: Take 24 mcg by mouth 2 (two) times daily. TAKE 1 CAPSULE TWICE A DAY WITH A MEAL) 180 capsule 3  . meloxicam (MOBIC) 15 MG tablet Take 15 mg by mouth daily.    . Multiple Vitamins-Minerals (MULTIVITAMIN WITH MINERALS) tablet Take 1 tablet by mouth daily.    . ondansetron (ZOFRAN) 4 MG tablet Take 1 tablet (4 mg total) by mouth daily as needed for nausea or vomiting. 30 tablet 1  . oxyCODONE-acetaminophen (PERCOCET/ROXICET) 5-325 MG tablet Take 1 tablet by mouth every 4 (four) hours as needed for severe pain.     . pantoprazole (PROTONIX) 40 MG tablet TAKE 1 TABLET BY MOUTH TWICE A DAY (Patient taking differently: TAKE 40 mg TABLET BY MOUTH TWICE A DAY) 180 tablet 2  . potassium chloride SA (KLOR-CON M20) 20 MEQ tablet Take 2 tablets (40 mEq total) by mouth daily. 180 tablet 3  . prazosin (MINIPRESS) 2 MG capsule Take 2 mg by mouth daily.    . prazosin (MINIPRESS) 5 MG capsule Take 5 mg by mouth at bedtime.    . traZODone (DESYREL) 100 MG tablet Take 100 mg by mouth at bedtime.  0  . [DISCONTINUED] lithium carbonate (LITHOBID) 300 MG CR tablet Take 1 tablet (300 mg total) by mouth every 12 (twelve) hours. For mood swings and depression 60 tablet 0   No current facility-administered medications on file prior to visit.    Allergies  Allergen Reactions  . Tetracyclines & Related Other (See Comments)    syncope  . Tramadol Other (See Comments)    seizures  . Ciprofloxacin Rash  . Codeine Itching  . Penicillins Hives and Rash    Has patient had a PCN reaction causing immediate rash, facial/tongue/throat swelling, SOB or lightheadedness with hypotension: No Has patient had a PCN reaction causing severe rash involving mucus membranes or skin necrosis: No Has patient had a PCN reaction that required hospitalization: No Has patient had a PCN reaction occurring within the last 10 years: No If all  of the above answers are "NO", then may proceed with Cephalosporin use.   Social History   Social History  . Marital status: Legally Separated    Spouse name: N/A  . Number of children: 3  . Years of education: 12   Occupational History  . Disabled    Social History Main Topics  . Smoking status: Never Smoker  . Smokeless tobacco: Never Used  . Alcohol use No  . Drug use: No  . Sexual activity: No   Other Topics Concern  . Not on file  Social History Narrative   Patient drink 4 16oz caffeine drinks a day    Family History  Problem Relation Age of Onset  . Diabetes Mother   . COPD Mother   . Hypertension Mother   . Hyperlipidemia Mother   . Heart disease Father   . Hyperlipidemia Father   . Hypertension Father   . Cancer Father   . Heart disease Brother   . Heart disease Daughter       Review of Systems  All other systems reviewed and are negative.      Objective:   Physical Exam  Constitutional: She is oriented to person, place, and time. She appears well-developed and well-nourished. No distress.  HENT:  Head: Normocephalic and atraumatic.  Right Ear: External ear normal.  Left Ear: External ear normal.  Nose: Nose normal.  Mouth/Throat: Oropharynx is clear and moist. No oropharyngeal exudate.  Eyes: Pupils are equal, round, and reactive to light. Conjunctivae and EOM are normal. Right eye exhibits no discharge. Left eye exhibits no discharge. No scleral icterus.  Neck: Normal range of motion. Neck supple. No JVD present. No tracheal deviation present. No thyromegaly present.  Cardiovascular: Normal rate, regular rhythm, normal heart sounds and intact distal pulses.  Exam reveals no gallop and no friction rub.   No murmur heard. Pulmonary/Chest: Effort normal and breath sounds normal. No stridor. No respiratory distress. She has no wheezes. She has no rales. She exhibits no tenderness.  Abdominal: Soft. Bowel sounds are normal. She exhibits no distension  and no mass. There is no tenderness. There is no rebound and no guarding.  Musculoskeletal: She exhibits no edema.       Right knee: She exhibits decreased range of motion. Tenderness found.       Lumbar back: She exhibits decreased range of motion, tenderness and pain.       Back:       Legs: Lymphadenopathy:    She has no cervical adenopathy.  Neurological: She is alert and oriented to person, place, and time. She has normal reflexes. No cranial nerve deficit. She exhibits normal muscle tone. Coordination normal.  Skin: Skin is warm. No rash noted. She is not diaphoretic. No erythema. No pallor.  Psychiatric: She has a normal mood and affect. Her behavior is normal. Judgment and thought content normal.  Vitals reviewed.         Assessment & Plan:  Petechiae - Plan: CBC with Differential/Platelet Migraines/Chronic daily headaches Chronic constipation  Check cbc for petechiae.  Begin topamax 25 mg poqday and increase to 50 mg poqday in 1 week and recheck in 2 weeks.  DC amitiza and add linzess 145 mcg poqday and increase to 290 in 2 weeks if not working.

## 2016-12-07 ENCOUNTER — Ambulatory Visit
Admission: RE | Admit: 2016-12-07 | Discharge: 2016-12-07 | Disposition: A | Discharge status: Home / Self Care | Payer: 59 | Source: Ambulatory Visit | Attending: Orthopaedic Surgery | Admitting: Orthopaedic Surgery

## 2016-12-07 DIAGNOSIS — M542 Cervicalgia: Secondary | ICD-10-CM

## 2016-12-10 ENCOUNTER — Ambulatory Visit (INDEPENDENT_AMBULATORY_CARE_PROVIDER_SITE_OTHER): Payer: Self-pay | Admitting: Podiatry

## 2016-12-10 ENCOUNTER — Encounter: Payer: Self-pay | Admitting: Podiatry

## 2016-12-10 ENCOUNTER — Ambulatory Visit (INDEPENDENT_AMBULATORY_CARE_PROVIDER_SITE_OTHER): Payer: 59

## 2016-12-10 DIAGNOSIS — M2041 Other hammer toe(s) (acquired), right foot: Secondary | ICD-10-CM

## 2016-12-10 DIAGNOSIS — M79673 Pain in unspecified foot: Secondary | ICD-10-CM | POA: Diagnosis not present

## 2016-12-10 DIAGNOSIS — Z9889 Other specified postprocedural states: Secondary | ICD-10-CM

## 2016-12-10 DIAGNOSIS — L600 Ingrowing nail: Secondary | ICD-10-CM

## 2016-12-13 NOTE — Progress Notes (Signed)
Subjective: [name] [name] is a 51 y.o. is seen today in office s/p right hammertoe repair 3-5. Since last appointment she has been in a regular shoe. She states that she is doing well and she has not had any swelling or redness and she is not having any open sores. She said that she is happy with the outcome of the surgery to this point. Denies any systemic complaints such as fevers, chills, nausea, vomiting. No calf pain, chest pain, shortness of breath.   Objective: General: No acute distress, AAOx3  DP/PT pulses palpable 2/4, CRT < 3 sec to all digits.  Protective sensation intact. Motor function intact.  Rightfoot: Incision is well coapted without any evidence of dehiscence and scars are well formed. There is no swelling erythema, ascending cellulitis. There is no fluctuance or crepitus. There is no malodor. No clinical signs of infection. The toe does sit in a more rectus position how they are slightly deviated in the transverse plane. Status post partial nail avulsion to the hallux toenail which is well-healed. There is no open lesions or pre-ulcerative lesions. No pain with calf compression, swelling, warmth, erythema.   Assessment and Plan:  Status post right foot surgery, doing well with no complications   -Treatment options discussed including all alternatives, risks, and complications -X-rays were obtained and reviewed with the patient. No evidence of acute fracture identified. Status post second toe partial amputation as well as hammertoe repair of the lesser digits. No definitive evidence of acute osteomyelitis identified at this time. -At this point she is doing well with no skin breakdown. She is able to wear regular shoe without any problems. The spinal see her back in 6 months but I encouraged her to call with any questions or concerns or any change in symptoms. His primary discharge her from the postoperative course.  Celesta Gentile, DPM

## 2016-12-14 ENCOUNTER — Ambulatory Visit: Payer: 59 | Admitting: Nurse Practitioner

## 2016-12-17 ENCOUNTER — Ambulatory Visit (INDEPENDENT_AMBULATORY_CARE_PROVIDER_SITE_OTHER): Payer: 59 | Admitting: Physician Assistant

## 2016-12-17 ENCOUNTER — Telehealth: Payer: Self-pay

## 2016-12-17 VITALS — BP 108/74 | HR 105 | Ht 64.0 in | Wt 247.0 lb

## 2016-12-17 DIAGNOSIS — R55 Syncope and collapse: Secondary | ICD-10-CM

## 2016-12-17 DIAGNOSIS — I10 Essential (primary) hypertension: Secondary | ICD-10-CM | POA: Diagnosis not present

## 2016-12-17 MED ORDER — METOPROLOL SUCCINATE ER 25 MG PO TB24: 12.5000 mg | ORAL_TABLET | Freq: Every day | ORAL | 2 refills | Status: DC

## 2016-12-17 NOTE — Progress Notes (Signed)
Cardiology Office Note   Date:  12/17/2016   ID:  [name] [name], DOB March 19, 1966, MRN [id number]  PCP:  [name], MD  Cardiologist:  Dr. [name], 11/23/2016  Rosaria Ferries, PA-C    History of Present Illness: [name] [name] is a 51 y.o. female with a history of  CP, tachypalpitations, FH CAD, asthma, anxiety/depression, GERD, Sz, OSA. She was referred to Dr [name] for CP>>MV 09/2016 w/ ?soft tissue attn, EF 67% w/ nl wall motion>>low risk. 30-day event monitor with no afib, no sig tachy- or brady-cardia. She has OA and back problems, balance problems, so walks with a cane.  Admit 08/7-08/12/2016 for near syncope, no cause found, no arrhythmias on telemetry, no med changes  [name] [name] presents for cardiology follow up.   She gets the light-headed episodes several times a week. She will start by feeling light-headed, she will then feel her heart going faster. It will speed up to (but not over) 130. It feels regular. The episodes will last up to 30 minutes. She feels drained. Some start at rest, some with ambulation. If she does not sit down, she will fall. If she sits down, she will not lose consciousness.  Her thyroid has been checked an is fine.   Once, during a spell, her BP was 154/111, but her BP normally stays normal. She has had steroid injections and taken dose packs for her back, but has not been on them long-term.  With every episode, she gets a severe headache.    Past Medical History:  Diagnosis Date  . Anxiety   . Asthma   . Bipolar 1 disorder ([place])    ect treatments last treatment Sep 02 1011  . Depression   . GERD (gastroesophageal reflux disease)   . Hypertension   . Seizures ([place])    last seizure was so long ago she can't remember.   . Sleep apnea     Past Surgical History:  Procedure Laterality Date  . ABDOMINAL HYSTERECTOMY    . BACK SURGERY    . CARPAL TUNNEL RELEASE     x2  . Laproscopic knee surgery    . PLANTAR FASCIA RELEASE       x2    Medication Sig  . albuterol (PROAIR HFA) 108 (90 Base) MCG/ACT inhaler INHALE 2 PUFFS INTO THE LUNGS EVERY 4 HOURS AS NEEDED FOR WHEEZING  . Artificial Tear Ointment (DRY EYES OP) Place 1 drop into both eyes daily as needed.  . busPIRone (BUSPAR) 5 MG tablet Take 5 mg by mouth 3 (three) times daily.  . butalbital-acetaminophen-caffeine (FIORICET, ESGIC) 50-325-40 MG tablet Take 1 tablet by mouth every 6 (six) hours as needed for headache or migraine.  . Calcium Carbonate-Vit D-Min (CALCIUM 1200 PO) Take 1 tablet by mouth daily.  . citalopram (CELEXA) 40 MG tablet Take 40 mg by mouth at bedtime.   . cyclobenzaprine (FLEXERIL) 10 MG tablet Take 10 mg by mouth 2 (two) times daily.   . EMBEDA 30-1.2 MG CPCR Take 1 capsule by mouth 2 (two) times daily.   Marland Kitchen estradiol (ESTRACE) 1 MG tablet Take 1 mg by mouth daily.  . fluticasone (FLONASE) 50 MCG/ACT nasal spray Place 2 sprays into both nostrils daily. (Patient taking differently: Place 2 sprays into both nostrils daily as needed for allergies. )  . gabapentin (NEURONTIN) 600 MG tablet Take 1 tablet (600 mg total) by mouth 3 (three) times daily. For anxiety and pain management. May have to stop  to take ECT.  . haloperidol (HALDOL) 5 MG tablet Take 1 tablet (5 mg total) by mouth at bedtime. For psychosis  . hydrochlorothiazide (HYDRODIURIL) 25 MG tablet Take 25 mg by mouth daily.  . meloxicam (MOBIC) 15 MG tablet Take 15 mg by mouth daily.  . Multiple Vitamins-Minerals (MULTIVITAMIN WITH MINERALS) tablet Take 1 tablet by mouth daily.  . ondansetron (ZOFRAN) 4 MG tablet Take 1 tablet by mouth as needed.  Marland Kitchen oxyCODONE-acetaminophen (PERCOCET) 10-325 MG tablet Take 1 tablet by mouth 3 (three) times daily.  . pantoprazole (PROTONIX) 40 MG tablet TAKE 1 TABLET BY MOUTH TWICE A DAY (Patient taking differently: TAKE 40 mg TABLET BY MOUTH TWICE A DAY)  . potassium chloride SA (KLOR-CON M20) 20 MEQ tablet Take 2 tablets (40 mEq total) by mouth daily.   . prazosin (MINIPRESS) 2 MG capsule Take 2 mg by mouth daily.  . traZODone (DESYREL) 100 MG tablet Take 100 mg by mouth at bedtime.   No current facility-administered medications for this visit.     Allergies:   Tetracyclines & related; Tramadol; Ciprofloxacin; Codeine; and Penicillins    Social History:  The patient  reports that she has never smoked. She has never used smokeless tobacco. She reports that she does not drink alcohol or use drugs.   Family History:  The patient's family history includes COPD in her mother; Cancer in her father; Diabetes in her mother; Heart disease in her brother, daughter, and father; Hyperlipidemia in her father and mother; Hypertension in her father and mother.    ROS:  Please see the history of present illness. All other systems are reviewed and negative.    Lying 126/85 - 109    Sitting 128/85-117    Standing/3 129/85- 119 3"           135/91- 121    PHYSICAL EXAM: VS:  BP 108/74   Pulse (!) 105   Ht 5\' 4"  (1.626 m)   Wt 247 lb (112 kg)   BMI 42.40 kg/m  , BMI Body mass index is 42.4 kg/m. GEN: Well nourished, well developed, female in no acute distress  HEENT: normal for age  Neck: no JVD, no carotid bruit, no masses Cardiac: RRR; no murmur, no rubs, or gallops Respiratory:  clear to auscultation bilaterally, normal work of breathing GI: soft, nontender, nondistended, + BS MS: no deformity or atrophy; no edema; distal pulses are 2+ in all 4 extremities   Skin: warm and dry, no rash Neuro:  Strength and sensation are intact Psych: euthymic mood, full affect   EKG:  EKG is ordered today. The ekg ordered today demonstrates ST, HR 106, no acute changes   Recent Labs: 11/23/2016: ALT 16; BUN 7; Creatinine, Ser 0.53; Potassium 3.7; Sodium 139 12/06/2016: Hemoglobin 13.0; Platelets 315    Lipid Panel    Component Value Date/Time   CHOL 177 11/18/2016 1522   TRIG 224 (H) 11/18/2016 1522   HDL 48 (L) 11/18/2016 1522   CHOLHDL 3.7  11/18/2016 1522   VLDL 45 (H) 11/18/2016 1522   LDLCALC 84 11/18/2016 1522     Wt Readings from Last 3 Encounters:  12/17/16 247 lb (112 kg)  12/06/16 240 lb (108.9 kg)  11/25/16 236 lb 1.6 oz (107.1 kg)     Other studies Reviewed: Additional studies/ records that were reviewed today include: office notes hospital records and testing.  ASSESSMENT AND PLAN: I reviewed the patient with Dr. Debara Pickett after she left  1.  Near syncope:  Because of her symptoms is unclear. She is on a low dose of prazosin for blood pressure control and this may be contributing. She could also have a POTTS variance. She is on a diuretic, but orthostatics were negative today.  We will stop the prazosin, and start a low-dose beta blocker to help blunt the high heart rate episodes and and help keep blood pressure under good control. This can be up titrated as needed if it helps her. If her symptoms do not improve, she may need referral to EP for possible tilt table testing.   She is not driving now and understands that she should not drive until her symptoms have resolved.  Current medicines are reviewed at length with the patient today.  The patient does not have concerns regarding medicines.  The following changes have been made:  Stop prazosin and start Toprol-XL 12.5 mg daily  Labs/ tests ordered today include:  No orders of the defined types were placed in this encounter.   Disposition:   FU with Dr. [name]  Signed, Lenoard Aden  12/17/2016 12:47 PM    [place] Phone: [phone]; Fax: [phone]  This note was written with the assistance of speech recognition software. Please excuse any transcriptional errors.

## 2016-12-17 NOTE — Telephone Encounter (Signed)
PER RHONDA stop prazosin and start metoprolol XL 12.5mg  (1/2 tab) daily  Pt notified verified pharmacy

## 2016-12-17 NOTE — Patient Instructions (Signed)
Medication Instructions:  Your physician recommends that you continue on your current medications as directed. Please refer to the Current Medication list given to you today.  If you need a refill on your cardiac medications before your next appointment, please call your pharmacy.  Follow-Up: Your physician wants you to follow-up in: IN 3 MONTHS WITH DR [name].    Thank you for choosing [facility] at [facility]!!

## 2016-12-21 ENCOUNTER — Telehealth: Payer: Self-pay | Admitting: *Deleted

## 2016-12-21 NOTE — Telephone Encounter (Signed)
Received call from patient.   Reports that topamax 50mg  remains ineffective. Reports daily HA continues.   Also reports that Linzess 290mg  has reduced Sx greatly. Requested refill on Linzess to be sent to pharmacy.   MD please advise.

## 2016-12-22 ENCOUNTER — Encounter: Payer: Self-pay | Admitting: Family Medicine

## 2016-12-23 MED ORDER — LINACLOTIDE 290 MCG PO CAPS: 290.0000 ug | ORAL_CAPSULE | Freq: Every day | ORAL | 1 refills | Status: DC

## 2016-12-23 MED ORDER — TOPIRAMATE 50 MG PO TABS: 50.0000 mg | ORAL_TABLET | Freq: Two times a day (BID) | ORAL | 1 refills | Status: DC

## 2016-12-23 NOTE — Telephone Encounter (Signed)
I would increase topamax to 50 bid.  Ok with refill on linzess.

## 2016-12-23 NOTE — Telephone Encounter (Signed)
Prescription sent to pharmacy.   Patient aware per MyChart.  

## 2017-01-03 ENCOUNTER — Ambulatory Visit: Payer: 59 | Admitting: Nurse Practitioner

## 2017-01-05 ENCOUNTER — Telehealth: Payer: Self-pay

## 2017-01-05 NOTE — Telephone Encounter (Signed)
Patient called requesting a surgical clearance letter for Dr. Patrice Paradise at [facility] to perform surgery on her neck.  Called patient to get clearance on what she is needing and if she may need to come in to have surgical clearance done in the office.  Patient states she needs letter ASAP and to fax letter at [phone].  Please advise if patient needs letter or needs to be seen.

## 2017-01-07 ENCOUNTER — Encounter: Payer: Self-pay | Admitting: Family Medicine

## 2017-01-07 NOTE — Telephone Encounter (Signed)
error 

## 2017-01-07 NOTE — Telephone Encounter (Signed)
Letter is in chart under jury duty. Needs to be printed so I can sign and then faxed.

## 2017-01-11 ENCOUNTER — Other Ambulatory Visit: Payer: Self-pay | Admitting: Family Medicine

## 2017-01-13 IMAGING — MR MR FOOT*R* WO/W CM
5 of 9 series · 25 of 40 positions shown · IV contrast (multihance)
Comparison: 10/03/2015 radiographs

CLINICAL DATA: Pressure sore along the bottom of the great toe for
1 year may be infected. Query osteomyelitis.

EXAM:
MRI OF THE RIGHT FOREFOOT WITHOUT AND WITH CONTRAST
TECHNIQUE: Multiplanar, multisequence MR imaging was performed both before and
after administration of intravenous contrast.
CONTRAST:  20mL MULTIHANCE GADOBENATE DIMEGLUMINE 529 MG/ML IV SOLN

[Series 5: T1 · coronal · 3.0mm · 0.23mm/px · 7 of 32 slices shown (1 of 2)]
[im 1/32]
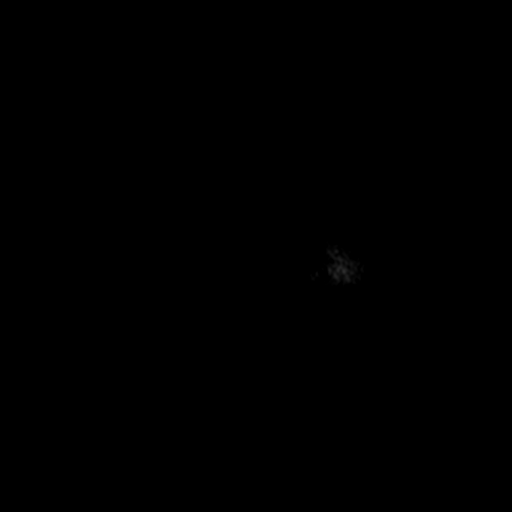
[im 6/32]
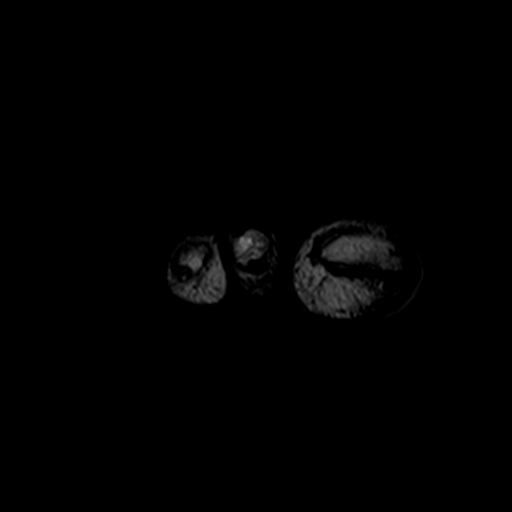
[im 11/32]
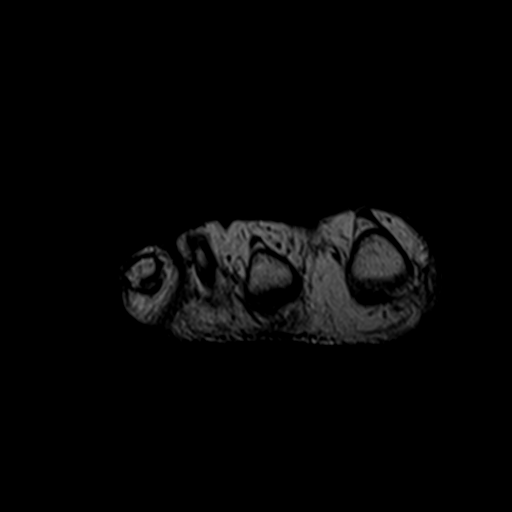
[im 16/32]
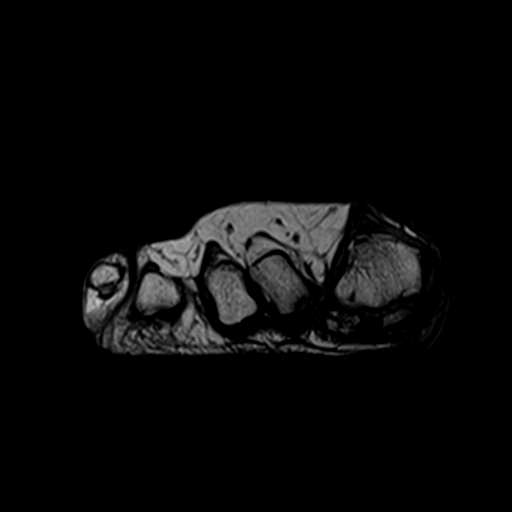
[im 21/32]
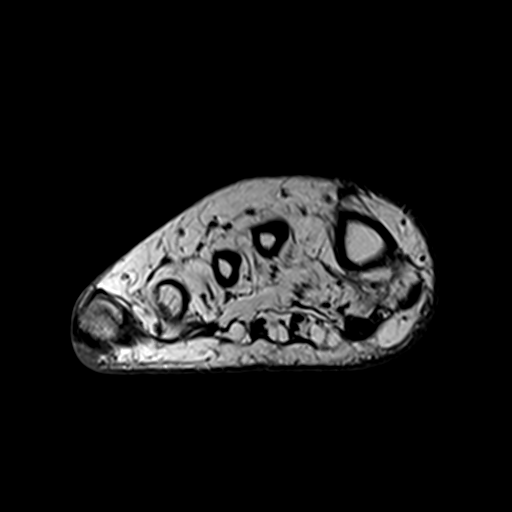
[im 26/32]
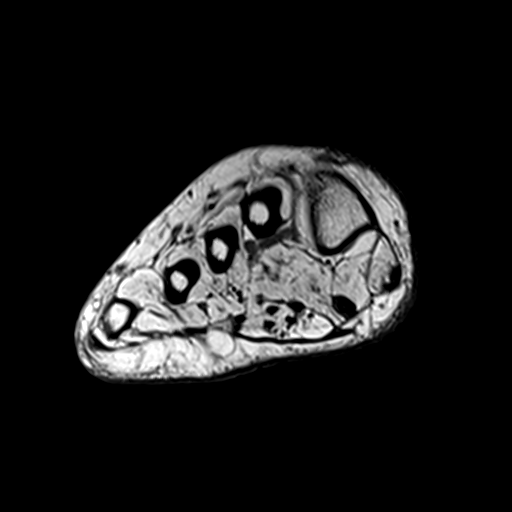
[im 32/32]
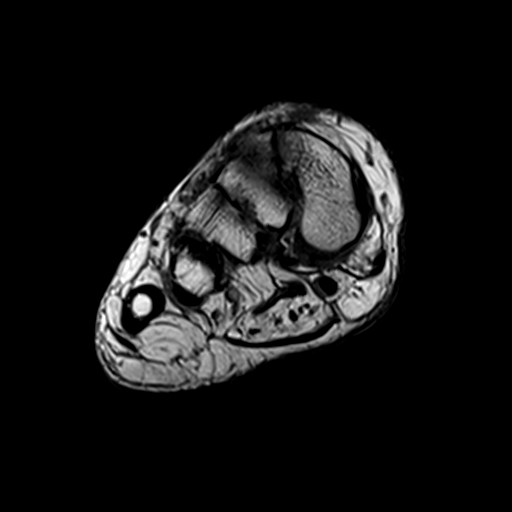

[Series 6: T1 fat-sat · coronal · 3.0mm · 0.23mm/px · 6 of 32 slices shown]
[im 1/32]
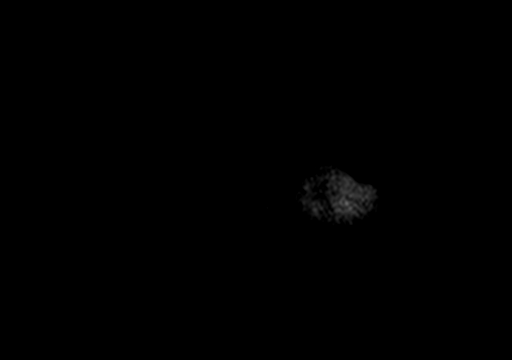
[im 7/32]
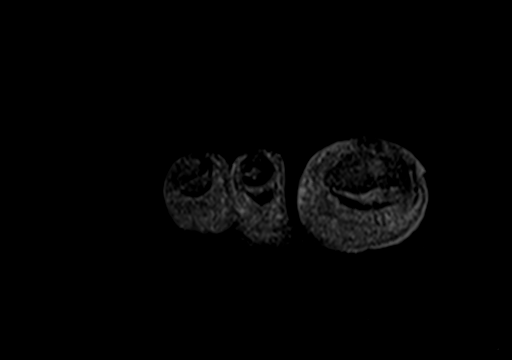
[im 13/32]
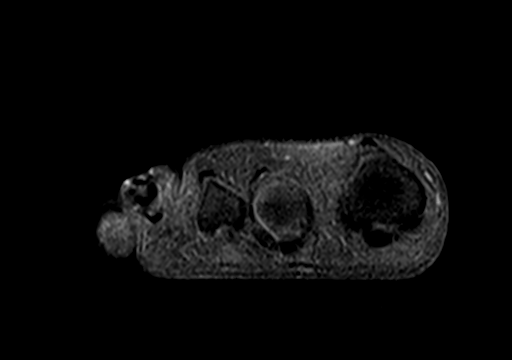
[im 19/32]
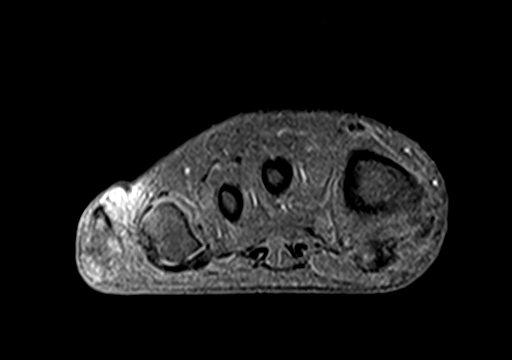
[im 25/32]
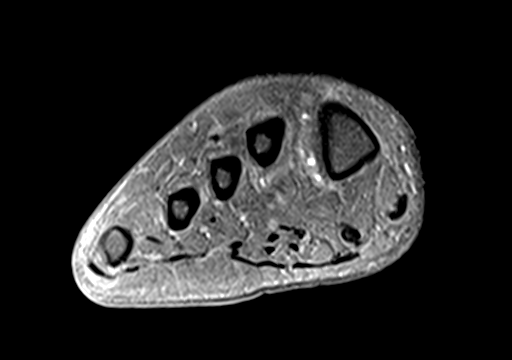
[im 32/32]
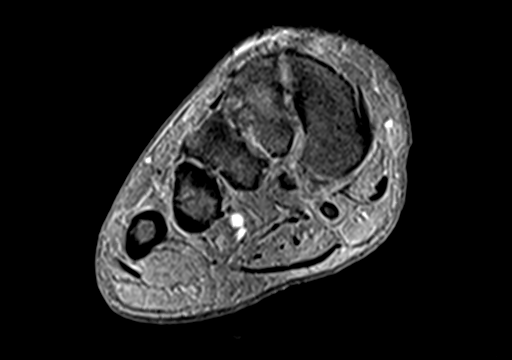

[Series 8: T1 · axial · 3.0mm · 0.59mm/px · z∈[-68,-15]mm · 3 of 18 slices shown (2 of 2)]
[im 1/18]
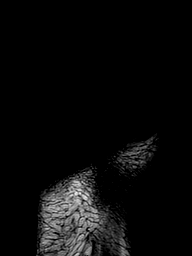
[im 9/18]
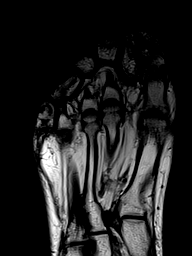
[im 18/18]
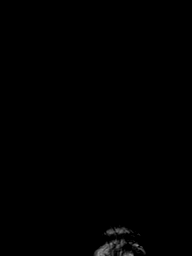

[Series 9: STIR · axial · 3.0mm · 0.59mm/px · z∈[-68,-15]mm · 3 of 18 slices shown]
[im 1/18]
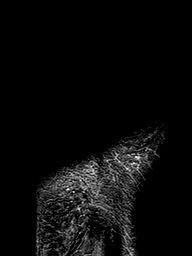
[im 9/18]
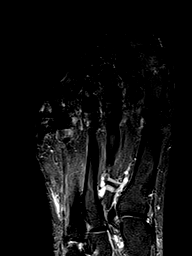
[im 18/18]
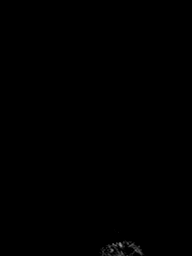

[Series 11: T2 fat-sat · coronal · 3.0mm · 0.25mm/px · 6 of 32 slices shown]
[im 1/32]
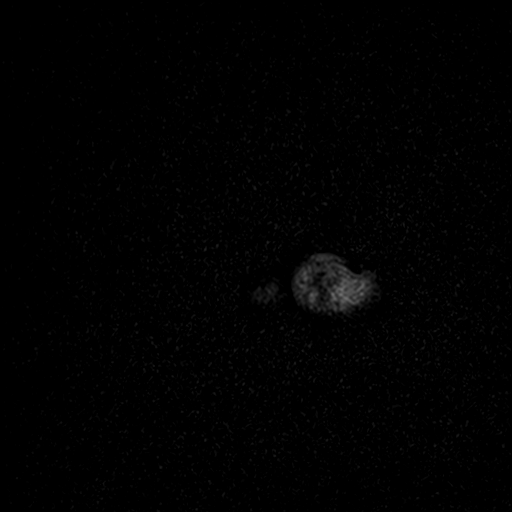
[im 7/32]
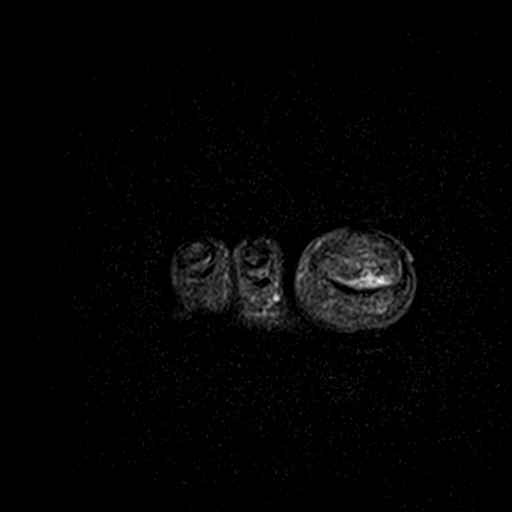
[im 13/32]
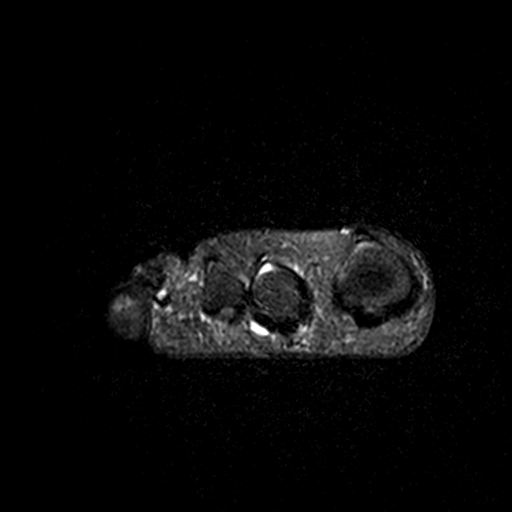
[im 19/32]
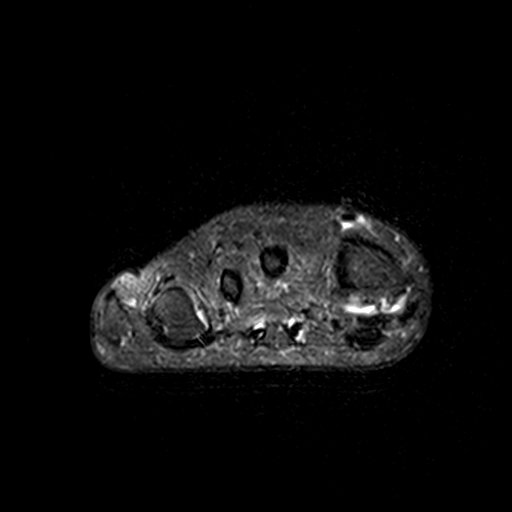
[im 25/32]
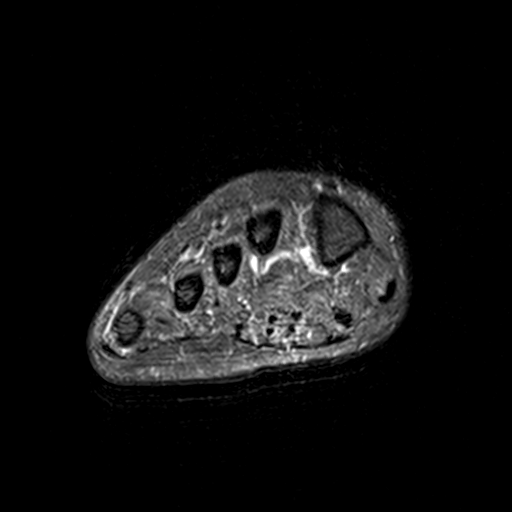
[im 32/32]
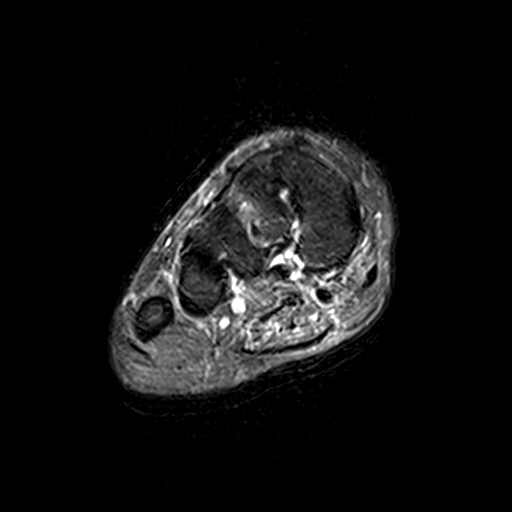

[25 of 40 positions shown; findings below may reference images not displayed]

FINDINGS: Low T1 signal intensity along the subcutaneous tissues of the
medial-plantar left great toe noted with low-grade enhancement
extending in an infiltrative manner in this vicinity, but node
drainable abscess. There is likely some associated cutaneous
thickening.

As shown on conventional radiographs there is proximal
interphalangeal joint fusion at the great toe, with subtle marrow
edema and low-grade enhancement in the shaft of the distal phalanx
great toe as shown for example on image [DATE].

The Lisfranc ligament is intact. As a degenerative subcortical cyst
or geode along the lateral base of the second metatarsal, image [DATE].
Degenerative spurring at the base of the fourth metatarsal.
Low-level marrow edema at the base of the fifth metatarsal is only
partially included on today's exam for example on image [DATE], with
some associated faint enhancement.
IMPRESSION: 1. Low-grade edema and enhancement in the shaft of the distal
phalanx great toe. The proximal and distal phalanges are fused. I am
skeptical that this low level of marrow edema and enhancement is due
to osteomyelitis simply based on the subtlety of the finding and the
lack of associated cortical irregularity, but there is a small
chance that this could represent very early osteomyelitis. There is
adjacent low T1 and mostly low T2 signal medially in the great toe
likely representing the underlying chronic ulceration.
2. Degenerative findings at the Lisfranc joint. We partially include
a small focus of edema and enhancement at the base of the fifth
metatarsal which is nonspecific but likely from arthropathy given
the slightly erosive appearance along the fifth metacarpal base on
10/03/2015 radiographs.

## 2017-01-26 ENCOUNTER — Ambulatory Visit: Payer: 59 | Admitting: Neurology

## 2017-02-01 ENCOUNTER — Telehealth: Payer: Self-pay | Admitting: Family Medicine

## 2017-02-01 DIAGNOSIS — M25561 Pain in right knee: Principal | ICD-10-CM

## 2017-02-01 DIAGNOSIS — M25562 Pain in left knee: Principal | ICD-10-CM

## 2017-02-01 DIAGNOSIS — G8929 Other chronic pain: Secondary | ICD-10-CM

## 2017-02-01 NOTE — Telephone Encounter (Signed)
Needs updated referral to Dr French Ana at [facility] for her knees.  Referral sent.

## 2017-02-11 ENCOUNTER — Other Ambulatory Visit: Payer: Self-pay | Admitting: Family Medicine

## 2017-02-11 IMAGING — CT CT ANGIO CHEST
3 of 7 series · 17 of 36 positions shown · IV contrast (Omni 300)
Comparison: Chest radiograph dated 10/26/2014 and CT dated
10/26/2014

CLINICAL DATA: 50-year-old female with shortness of breath and
tachycardia

EXAM:
CT ANGIOGRAPHY CHEST WITH CONTRAST
TECHNIQUE: Multidetector CT imaging of the chest was performed using the
standard protocol during bolus administration of intravenous
contrast. Multiplanar CT image reconstructions and MIPs were
obtained to evaluate the vascular anatomy.
CONTRAST:  80 cc Isovue 370

[Series 8: pe lung · axial · 0.73mm/px · z∈[+1172,+1310]mm · 4 of 116 slices shown]
[im 24/116  mediastinal]
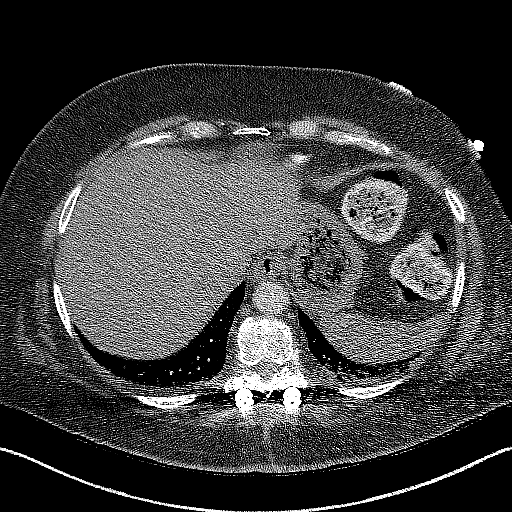
[im 47/116  mediastinal]
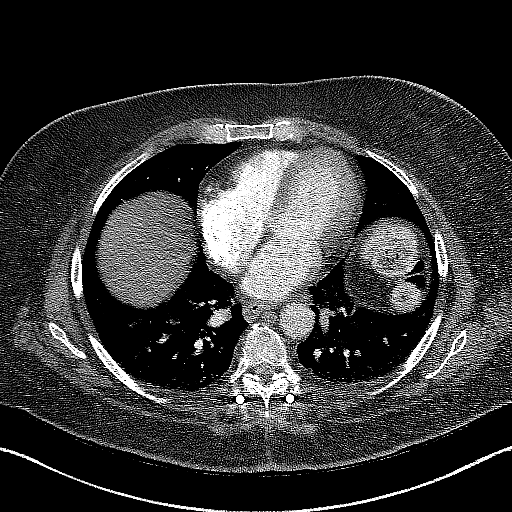
[im 70/116  mediastinal]
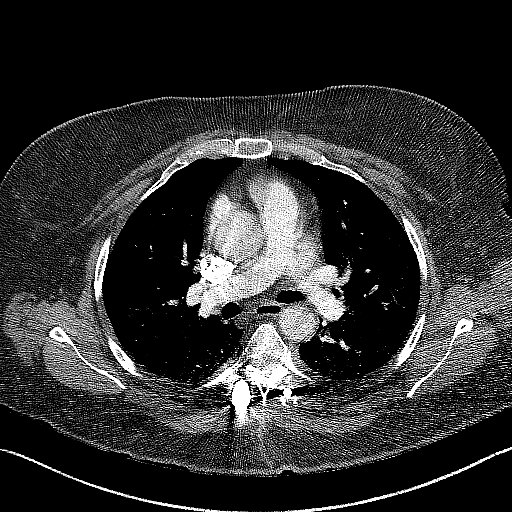
[im 93/116  mediastinal]
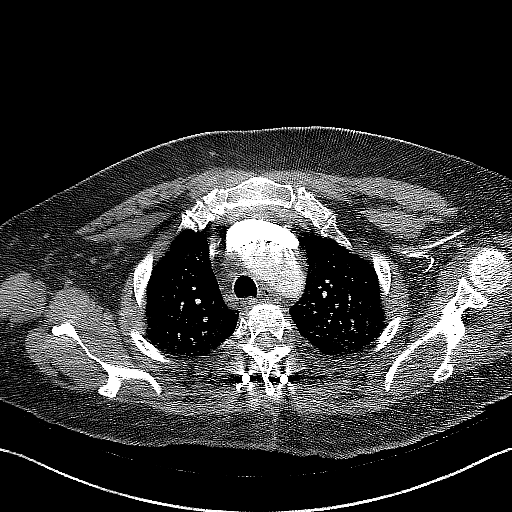

[Series 9: pe thins · axial · 0.73mm/px · z∈[+1107,+1335]mm · 12 of 270 slices shown]
[im 21/270  lung]
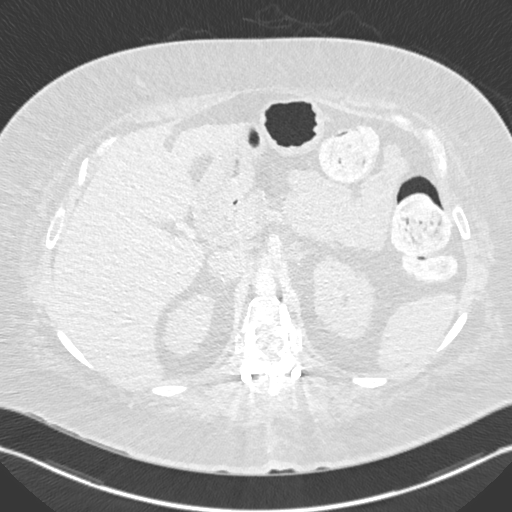
[im 42/270  mediastinal]
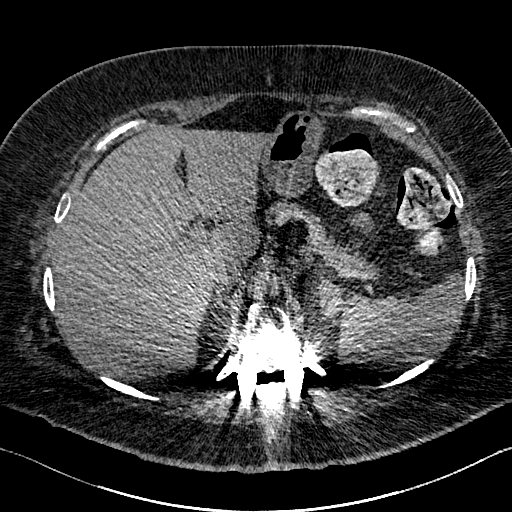
[im 63/270  lung]
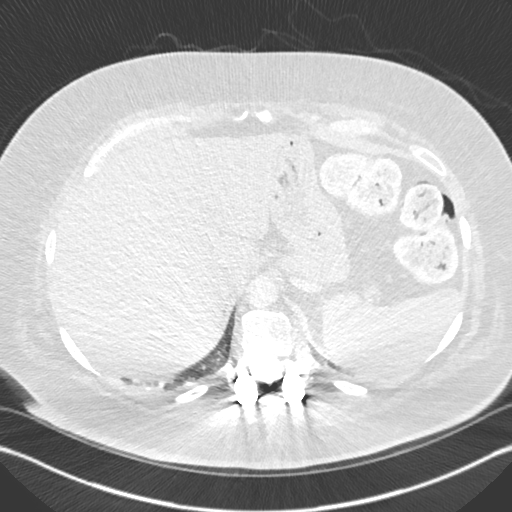
[im 83/270  mediastinal]
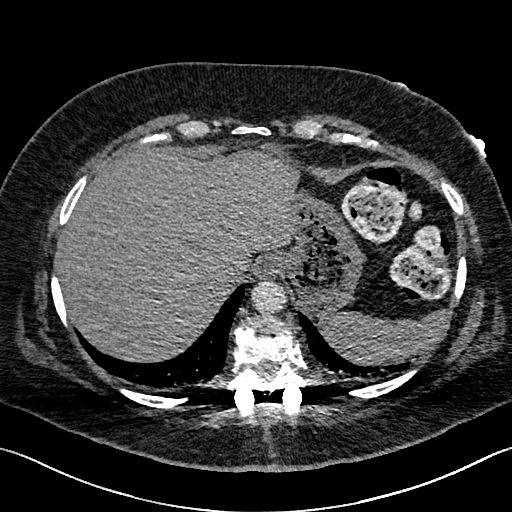
[im 104/270  lung]
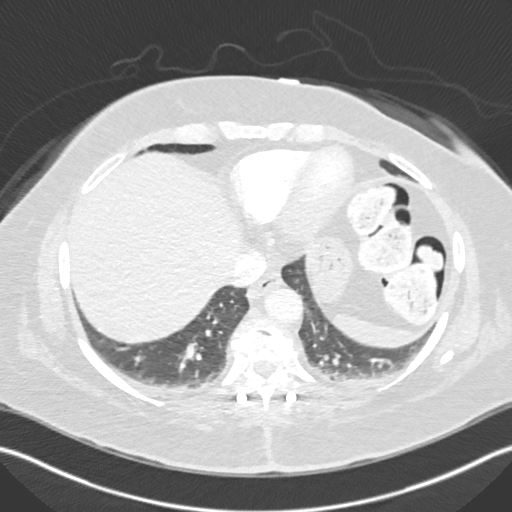
[im 125/270  mediastinal]
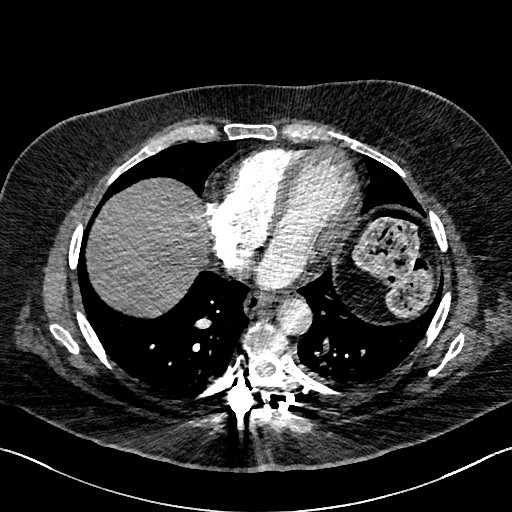
[im 145/270  lung]
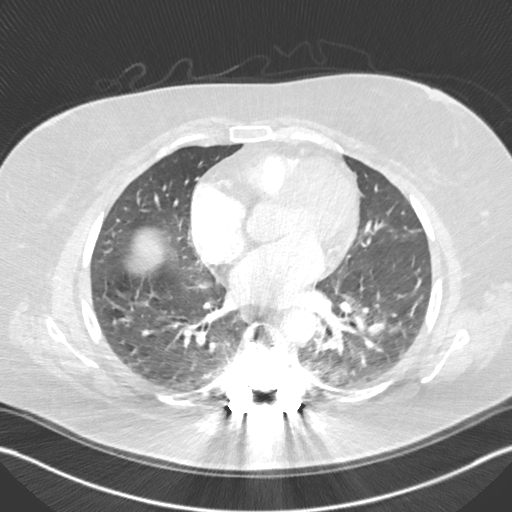
[im 166/270  mediastinal]
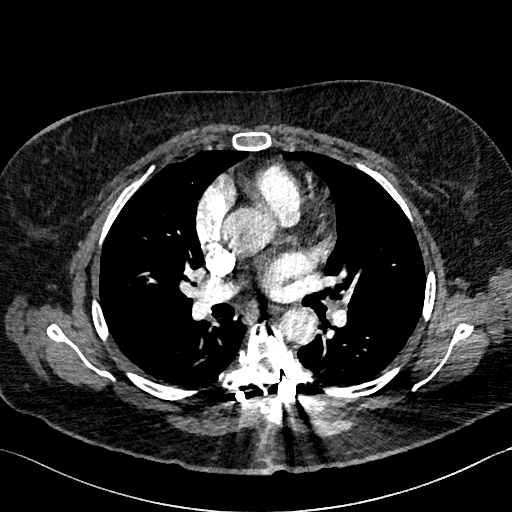
[im 187/270  lung]
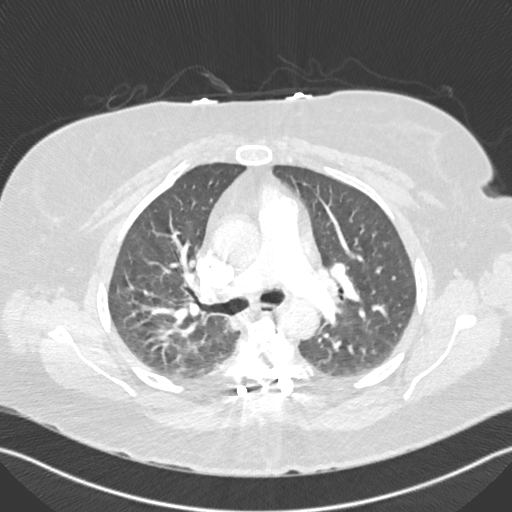
[im 207/270  mediastinal]
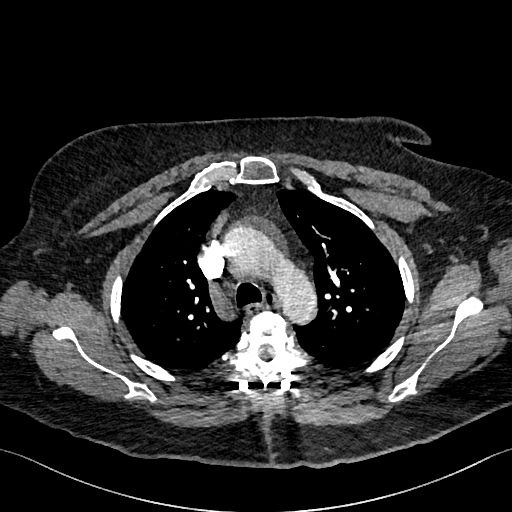
[im 228/270  lung]
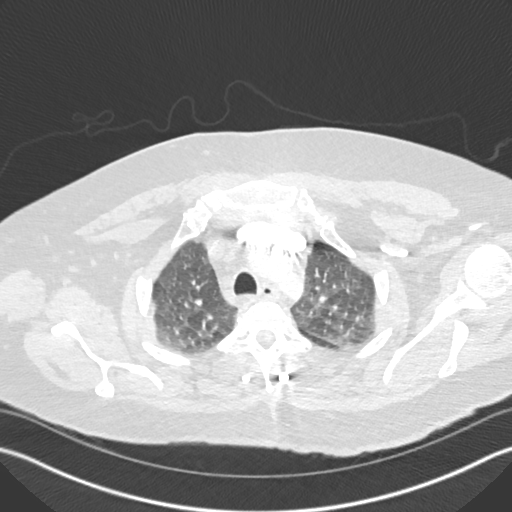
[im 249/270  mediastinal]
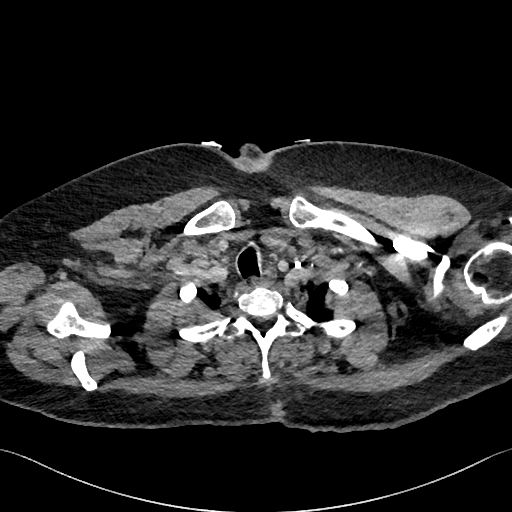

[Series 10: pe 2mm cor · coronal · 0.55mm/px · 1 of 133 slices shown]
[im 67/133  mediastinal]
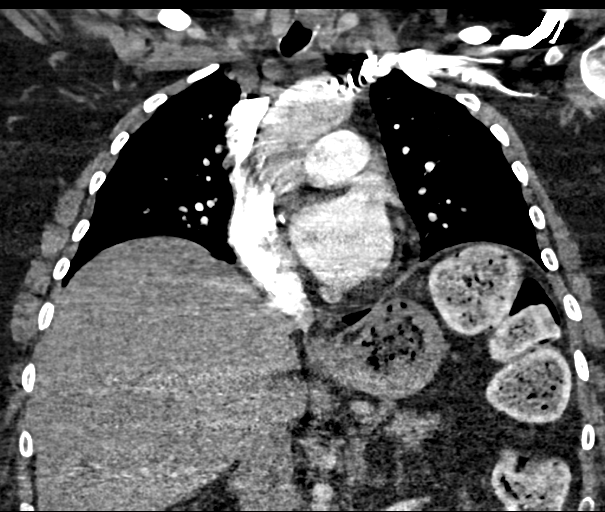

[17 of 36 positions shown; findings below may reference images not displayed]

FINDINGS: Evaluation is limited due to streak artifact caused by metallic
spinal hardware.

There minimal bibasilar dependent atelectatic changes of the lungs.
There is no focal consolidation, pleural effusion, or pneumothorax.
The central airways are patent.

The thoracic aorta appears unremarkable. The origins of the great
vessels of the aortic arch appear patent. There is no CT evidence of
pulmonary embolism. There is no cardiomegaly or pericardial
effusion. No hilar or mediastinal adenopathy. The esophagus is
grossly unremarkable with no thyroid nodules identified.

There is no axillary or supraclavicular adenopathy. The chest wall
soft tissues appear unremarkable. There is multilevel degenerative
changes of the spine. Posterior thoracic Harrington rod.
Age-indeterminate T9 compression fracture with approximately 50%
loss of vertebral body height and anterior wedging.

The visualized upper abdomen appears unremarkable.

Review of the MIP images confirms the above findings.
IMPRESSION: No CT evidence of pulmonary embolism.

## 2017-02-22 ENCOUNTER — Ambulatory Visit: Payer: 59 | Admitting: Neurology

## 2017-03-09 ENCOUNTER — Ambulatory Visit: Payer: Medicaid Other | Admitting: Cardiovascular Disease

## 2017-03-15 HISTORY — PX: NECK SURGERY: SHX720

## 2017-03-18 ENCOUNTER — Ambulatory Visit: Payer: 59 | Admitting: Cardiovascular Disease

## 2017-03-22 ENCOUNTER — Ambulatory Visit: Payer: Medicaid Other | Admitting: Cardiovascular Disease

## 2017-03-29 ENCOUNTER — Ambulatory Visit: Payer: Medicaid Other | Admitting: Obstetrics

## 2017-03-30 ENCOUNTER — Ambulatory Visit: Payer: Medicaid Other | Admitting: Cardiovascular Disease

## 2017-03-30 ENCOUNTER — Telehealth: Payer: Self-pay | Admitting: Cardiovascular Disease

## 2017-03-30 MED ORDER — METOPROLOL SUCCINATE ER 25 MG PO TB24: 12.5000 mg | ORAL_TABLET | Freq: Every day | ORAL | 6 refills | Status: DC

## 2017-03-30 NOTE — Telephone Encounter (Signed)
°*  STAT* If patient is at the pharmacy, call can be transferred to refill team.   1. Which medications need to be refilled? (please list name of each medication and dose if known) Metoprolol 25 mg  2. Which pharmacy/location (including street and city if local pharmacy) is medication to be sent to?[place], [place]  3. Do they need a 30 day or 90 day supply? [place]

## 2017-04-01 ENCOUNTER — Other Ambulatory Visit: Payer: Self-pay | Admitting: Family Medicine

## 2017-04-01 ENCOUNTER — Ambulatory Visit: Payer: Medicaid Other | Admitting: Cardiovascular Disease

## 2017-04-01 MED ORDER — POTASSIUM CHLORIDE 20 MEQ/15ML (10%) PO SOLN: 40.0000 meq | Freq: Every day | ORAL | 0 refills | Status: DC

## 2017-04-07 ENCOUNTER — Ambulatory Visit: Payer: Medicaid Other | Admitting: Obstetrics

## 2017-04-15 ENCOUNTER — Encounter (HOSPITAL_COMMUNITY): Payer: Self-pay | Admitting: Family Medicine

## 2017-04-15 ENCOUNTER — Ambulatory Visit (HOSPITAL_COMMUNITY)
Admission: EM | Admit: 2017-04-15 | Discharge: 2017-04-15 | Disposition: A | Discharge status: Home / Self Care | Payer: 59 | Attending: Internal Medicine | Admitting: Internal Medicine

## 2017-04-15 DIAGNOSIS — H6122 Impacted cerumen, left ear: Secondary | ICD-10-CM | POA: Diagnosis not present

## 2017-04-15 MED ORDER — NEOMYCIN-POLYMYXIN-HC 3.5-10000-1 OT SUSP: 4.0000 [drp] | Freq: Three times a day (TID) | OTIC | 0 refills | Status: DC

## 2017-04-15 NOTE — ED Triage Notes (Signed)
Pt here for left ear fullness and loss of hearing.

## 2017-04-15 NOTE — ED Provider Notes (Signed)
[place]    CSN: [id number] Arrival date & time: 04/15/17  1056     History   Chief Complaint Chief Complaint  Patient presents with  . Ear Fullness    HPI [name] [name] is a 51 y.o. female.   51 year old female, presenting today complaining of fullness.  States that she got water in her left ear while in the shower a few days ago.  States that she attempted to clean it out with a Q-tip and was unable to hear out of her left ear normally since that time.  States that she believes she may have a cerumen impaction.  She has had this in the past and it felt the same.   The history is provided by the patient.  Ear Fullness  This is a new problem. The current episode started more than 1 week ago. The problem occurs constantly. The problem has not changed since onset.Pertinent negatives include no chest pain, no abdominal pain, no headaches and no shortness of breath. Nothing aggravates the symptoms. Nothing relieves the symptoms. She has tried nothing for the symptoms. The treatment provided no relief.    Past Medical History:  Diagnosis Date  . Anxiety   . Asthma   . Bipolar 1 disorder ([place])    ect treatments last treatment Sep 02 1011  . Depression   . GERD (gastroesophageal reflux disease)   . Hypertension   . Seizures ([place])    last seizure was so long ago she can't remember.   . Sleep apnea     Patient Active Problem List   Diagnosis Date Noted  . Syncope and collapse   . Near syncope 11/23/2016  . Abdominal pain 09/23/2016  . Anxiety 09/23/2016  . 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[place]) 09/23/2016  . Constipation 09/23/2016  . DDD (degenerative disc disease), lumbosacral 09/23/2016  . Hypertension 09/23/2016  . Migraines 09/23/2016  . Myelopathy ([place]) 09/23/2016  . Nausea 09/23/2016  . Post laminectomy syndrome 09/23/2016  . Pseudoarthrosis of lumbar spine 09/23/2016  . Seizures ([place]) 09/23/2016  . Shortness of breath 09/23/2016  . Sleep  apnea 09/23/2016  . Spinal stenosis of lumbar region 09/23/2016  . Vertigo 09/23/2016  . 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 Chronic pain syndrome   . Esophageal reflux   . Depression   . Rash 11/14/2015  . Cellulitis of great toe of right foot 08/12/2015  . Toe ulcer, right ([place]) 04/11/2015  . Cellulitis of toe of right foot 04/11/2015  . Nonspecific chest pain   . Chest pain 10/26/2014  . Obesity 10/26/2014  . Hypokalemia 10/26/2014  . 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) 10/26/2014  . Atypical angina ([place]) 10/26/2014  . Tardive dyskinesia 08/26/2011  . Manic bipolar I disorder with rapid cycling ([place]) 05/18/2011    Class: Acute  . Asthma 02/24/2011  . Chronic pain 02/24/2011  . Essential hypertension 02/24/2011  . History of migraine headaches 02/24/2011    Past Surgical History:  Procedure Laterality Date  . ABDOMINAL HYSTERECTOMY    . BACK SURGERY    . CARPAL TUNNEL RELEASE     x2  . Laproscopic knee surgery    . PLANTAR FASCIA RELEASE     x2    OB History    No data available       Home Medications    Prior to Admission medications   Medication Sig Start Date End Date Taking? Authorizing Provider  Artificial Tear Ointment (DRY EYES OP) Place 1 drop into  both eyes daily as needed.    [provider]  busPIRone (BUSPAR) 5 MG tablet Take 5 mg by mouth 3 (three) times daily.    [provider]  butalbital-acetaminophen-caffeine (FIORICET, ESGIC) 50-325-40 MG tablet Take 1 tablet by mouth every 6 (six) hours as needed for headache or migraine. 12/06/16   [name], MD  Calcium Carbonate-Vit D-Min (CALCIUM 1200 PO) Take 1 tablet by mouth daily.    [provider]  citalopram (CELEXA) 40 MG tablet Take 40 mg by mouth at bedtime.  05/28/11   Darrol Jump, MD  cyclobenzaprine (FLEXERIL) 10 MG tablet Take 10 mg by mouth 2 (two) times daily.     [provider]  EMBEDA 30-1.2 MG CPCR Take 1 capsule by mouth 2 (two) times daily.  10/19/16    [provider]  estradiol (ESTRACE) 1 MG tablet Take 1 mg by mouth daily. 10/29/16   [provider]  fluticasone (FLONASE) 50 MCG/ACT nasal spray Place 2 sprays into both nostrils daily. Patient taking differently: Place 2 sprays into both nostrils daily as needed for allergies.  04/05/16   [name], MD  gabapentin (NEURONTIN) 600 MG tablet Take 1 tablet (600 mg total) by mouth 3 (three) times daily. For anxiety and pain management. May have to stop to take ECT. 05/28/11   Darrol Jump, MD  haloperidol (HALDOL) 5 MG tablet Take 1 tablet (5 mg total) by mouth at bedtime. For psychosis 11/15/15   Barton Dubois, MD  hydrochlorothiazide (HYDRODIURIL) 25 MG tablet TAKE ONE TABLET BY MOUTH DAILY 01/11/17   [name], MD  LINZESS 290 MCG CAPS capsule Take 1 capsule (290 mcg total) by mouth daily before breakfast. 01/11/17   [name], MD  meloxicam (MOBIC) 15 MG tablet Take 15 mg by mouth daily. 11/20/16   [provider]  metoprolol succinate (TOPROL XL) 25 MG 24 hr tablet Take 0.5 tablets (12.5 mg total) by mouth daily. 03/30/17   Barrett, Evelene Croon, PA-C  Multiple Vitamins-Minerals (MULTIVITAMIN WITH MINERALS) tablet Take 1 tablet by mouth daily.    [provider]  neomycin-polymyxin-hydrocortisone (CORTISPORIN) 3.5-10000-1 OTIC suspension Place 4 drops into both ears 3 (three) times daily. 04/15/17   Blue, Olivia C, PA-C  ondansetron (ZOFRAN) 4 MG tablet Take 1 tablet by mouth as needed. 11/25/16   [provider]  oxyCODONE-acetaminophen (PERCOCET) 10-325 MG tablet Take 1 tablet by mouth 3 (three) times daily. 12/14/16   [provider]  pantoprazole (PROTONIX) 40 MG tablet TAKE 1 TABLET BY MOUTH TWICE A DAY Patient taking differently: TAKE 40 mg TABLET BY MOUTH TWICE A DAY 11/08/16   [name], MD  potassium chloride 20 MEQ/15ML (10%) SOLN Take 30 mLs (40 mEq total) by mouth daily. 04/01/17   [name], MD    potassium chloride SA (KLOR-CON M20) 20 MEQ tablet Take 2 tablets (40 mEq total) by mouth daily. 11/02/16   [name], MD  PROAIR HFA 108 [phone] Base) MCG/ACT inhaler INHALE TWO puffs BY MOUTH EVERY 4 HOURS AS NEEDED FOR WHEEZING 02/11/17   [name], MD  topiramate (TOPAMAX) 50 MG tablet TAKE ONE TABLET BY MOUTH TWICE DAILY 01/11/17   [name], MD  traZODone (DESYREL) 100 MG tablet Take 100 mg by mouth at bedtime. 07/29/14   [provider]    Family History Family History  Problem Relation Age of Onset  . Diabetes Mother   . COPD Mother   .  Hypertension Mother   . Hyperlipidemia Mother   . Heart disease Father   . Hyperlipidemia Father   . Hypertension Father   . Cancer Father   . Heart disease Brother   . Heart disease Daughter     Social History Social History   Tobacco Use  . Smoking status: Never Smoker  . Smokeless tobacco: Never Used  Substance Use Topics  . Alcohol use: No  . Drug use: No     Allergies   Tetracyclines & related; Tramadol; Ciprofloxacin; Codeine; and Penicillins   Review of Systems Review of Systems  Constitutional: Negative for chills and fever.  HENT: Positive for ear pain. Negative for sore throat.   Eyes: Negative for pain and visual disturbance.  Respiratory: Negative for cough and shortness of breath.   Cardiovascular: Negative for chest pain and palpitations.  Gastrointestinal: Negative for abdominal pain and vomiting.  Genitourinary: Negative for dysuria and hematuria.  Musculoskeletal: Negative for arthralgias and back pain.  Skin: Negative for color change and rash.  Neurological: Negative for seizures, syncope and headaches.  All other systems reviewed and are negative.    Physical Exam Triage Vital Signs ED Triage Vitals  Enc Vitals Group     BP 04/15/17 1126 113/76     Pulse Rate 04/15/17 1126 80     Resp 04/15/17 1126 18     Temp 04/15/17 1126 98.4 F (36.9 C)     Temp src --      SpO2  04/15/17 1126 97 %     Weight --      Height --      Head Circumference --      Peak Flow --      Pain Score 04/15/17 1124 3     Pain Loc --      Pain Edu? --      Excl. in [place]? --    No data found.  Updated Vital Signs BP 113/76   Pulse 80   Temp 98.4 F (36.9 C)   Resp 18   SpO2 97%   Visual Acuity Right Eye Distance:   Left Eye Distance:   Bilateral Distance:    Right Eye Near:   Left Eye Near:    Bilateral Near:     Physical Exam  Constitutional: She appears well-developed and well-nourished. No distress.  HENT:  Head: Normocephalic and atraumatic.  Right Ear: Hearing, tympanic membrane, external ear and ear canal normal.  Left Ear: Hearing, tympanic membrane and external ear normal.  Nose: Nose normal.  Mouth/Throat: Oropharynx is clear and moist. No oropharyngeal exudate, posterior oropharyngeal edema, posterior oropharyngeal erythema or tonsillar abscesses.  Cerumen impaction left ear    Eyes: Conjunctivae are normal.  Neck: Neck supple.  Cardiovascular: Normal rate and regular rhythm.  No murmur heard. Pulmonary/Chest: Effort normal and breath sounds normal. No stridor. No respiratory distress. She has no wheezes. She has no rhonchi. She has no rales.  Abdominal: Soft. There is no tenderness.  Musculoskeletal: She exhibits no edema.  Neurological: She is alert.  Skin: Skin is warm and dry.  Psychiatric: She has a normal mood and affect.  Nursing note and vitals reviewed.    UC Treatments / Results  Labs (all labs ordered are listed, but only abnormal results are displayed) Labs Reviewed - No data to display  EKG  EKG Interpretation None       Radiology No results found.  Procedures Procedures (including critical care time)  Medications Ordered in UC  Medications - No data to display   Initial Impression / Assessment and Plan / UC Course  I have reviewed the triage vital signs and the nursing notes.  Pertinent labs & imaging results  that were available during my care of the patient were reviewed by me and considered in my medical decision making (see chart for details).     Earwax removal performed by nursing staff.  TMs visualized bilaterally.  There is erythema noted to the ear canals bilaterally.  Will discharge patient home with Cortisporin eardrops to prevent possible otitis externa.  Final Clinical Impressions(s) / UC Diagnoses   Final diagnoses:  Impacted cerumen of left ear    ED Discharge Orders        Ordered    neomycin-polymyxin-hydrocortisone (CORTISPORIN) 3.5-10000-1 OTIC suspension  3 times daily     04/15/17 1237       Controlled Substance Prescriptions [place] Controlled Substance Registry consulted? Not Applicable   Phebe Colla, [place] 04/15/17 1239

## 2017-04-18 ENCOUNTER — Ambulatory Visit: Payer: 59 | Admitting: Family Medicine

## 2017-04-21 ENCOUNTER — Encounter: Payer: Self-pay | Admitting: Obstetrics

## 2017-04-21 ENCOUNTER — Ambulatory Visit (INDEPENDENT_AMBULATORY_CARE_PROVIDER_SITE_OTHER): Payer: 59 | Admitting: Obstetrics

## 2017-04-21 VITALS — BP 127/81 | HR 85 | Ht 64.0 in | Wt 226.4 lb

## 2017-04-21 DIAGNOSIS — Z1239 Encounter for other screening for malignant neoplasm of breast: Secondary | ICD-10-CM

## 2017-04-21 DIAGNOSIS — Z0000 Encounter for general adult medical examination without abnormal findings: Secondary | ICD-10-CM | POA: Diagnosis not present

## 2017-04-21 DIAGNOSIS — Z01419 Encounter for gynecological examination (general) (routine) without abnormal findings: Secondary | ICD-10-CM | POA: Diagnosis not present

## 2017-04-21 DIAGNOSIS — B372 Candidiasis of skin and nail: Secondary | ICD-10-CM

## 2017-04-21 DIAGNOSIS — E2839 Other primary ovarian failure: Secondary | ICD-10-CM

## 2017-04-21 MED ORDER — ESTRADIOL 0.1 MG/24HR TD PTWK: 0.1000 mg | MEDICATED_PATCH | TRANSDERMAL | 12 refills | Status: DC

## 2017-04-21 MED ORDER — CLOTRIMAZOLE 1 % EX CREA: 1.0000 "application " | TOPICAL_CREAM | Freq: Two times a day (BID) | CUTANEOUS | 2 refills | Status: DC

## 2017-04-21 NOTE — Progress Notes (Signed)
Pt would like to discuss the estradiol Last Mammogram 2017

## 2017-04-21 NOTE — Progress Notes (Signed)
Subjective:        [name] [name] is a 52 y.o. female here for a routine exam.  Current complaints: Rash in abdominal fold.  Hot flashes on E2 pill.  She is S/P Hysterectomy..    Personal health questionnaire:  Is patient Ashkenazi Jewish, have a family history of breast and/or ovarian cancer: no Is there a family history of uterine cancer diagnosed at age < 65, gastrointestinal cancer, urinary tract cancer, family member who is a Field seismologist syndrome-associated carrier: no Is the patient overweight and hypertensive, family history of diabetes, personal history of gestational diabetes, preeclampsia or PCOS: no Is patient over 28, have PCOS,  family history of premature CHD under age 75, diabetes, smoke, have hypertension or peripheral artery disease:  no At any time, has a partner hit, kicked or otherwise hurt or frightened you?: no Over the past 2 weeks, have you felt down, depressed or hopeless?: no Over the past 2 weeks, have you felt little interest or pleasure in doing things?:no   Gynecologic History No LMP recorded. Patient has had a hysterectomy. Contraception: status post hysterectomy Last Pap: unknown. Results were: unknown Last mammogram: 2017. Results were: normal  Obstetric History OB History  Gravida Para Term Preterm AB Living  3 3 3     3   SAB TAB Ectopic Multiple Live Births          3    # Outcome Date GA Lbr Len/2nd Weight Sex Delivery Anes PTL Lv  3 Term 04/19/87     Vag-Spont   LIV  2 Term 05/23/86     Vag-Spont   LIV  1 Term 11/07/85     Vag-Spont   LIV      Past Medical History:  Diagnosis Date  . Anxiety   . Asthma   . Bipolar 1 disorder ([place])    ect treatments last treatment Sep 02 1011  . Depression   . GERD (gastroesophageal reflux disease)   . Hypertension   . Seizures ([place])    last seizure was so long ago she can't remember.   . Sleep apnea     Past Surgical History:  Procedure Laterality Date  . ABDOMINAL HYSTERECTOMY    . BACK  SURGERY    . CARPAL TUNNEL RELEASE     x2  . Laproscopic knee surgery    . NECK SURGERY  03/15/2017  . PLANTAR FASCIA RELEASE     x2     Current Outpatient Medications:  .  Artificial Tear Ointment (DRY EYES OP), Place 1 drop into both eyes daily as needed., Disp: , Rfl:  .  busPIRone (BUSPAR) 5 MG tablet, Take 5 mg by mouth 3 (three) times daily., Disp: , Rfl:  .  butalbital-acetaminophen-caffeine (FIORICET, ESGIC) 50-325-40 MG tablet, Take 1 tablet by mouth every 6 (six) hours as needed for headache or migraine., Disp: 14 tablet, Rfl: 0 .  Calcium Carbonate-Vit D-Min (CALCIUM 1200 PO), Take 1 tablet by mouth daily., Disp: , Rfl:  .  citalopram (CELEXA) 40 MG tablet, Take 40 mg by mouth at bedtime. , Disp: , Rfl:  .  cyclobenzaprine (FLEXERIL) 10 MG tablet, Take 10 mg by mouth 2 (two) times daily. , Disp: , Rfl:  .  EMBEDA 30-1.2 MG CPCR, Take 1 capsule by mouth 2 (two) times daily. , Disp: , Rfl:  .  estradiol (ESTRACE) 1 MG tablet, Take 1 mg by mouth daily., Disp: , Rfl: 0 .  fluticasone (FLONASE) 50 MCG/ACT nasal spray,  Place 2 sprays into both nostrils daily. (Patient taking differently: Place 2 sprays into both nostrils daily as needed for allergies. ), Disp: 48 g, Rfl: 2 .  gabapentin (NEURONTIN) 600 MG tablet, Take 1 tablet (600 mg total) by mouth 3 (three) times daily. For anxiety and pain management. May have to stop to take ECT., Disp: 90 tablet, Rfl: 0 .  haloperidol (HALDOL) 5 MG tablet, Take 1 tablet (5 mg total) by mouth at bedtime. For psychosis, Disp: , Rfl:  .  hydrochlorothiazide (HYDRODIURIL) 25 MG tablet, TAKE ONE TABLET BY MOUTH DAILY, Disp: 90 tablet, Rfl: 3 .  LINZESS 290 MCG CAPS capsule, Take 1 capsule (290 mcg total) by mouth daily before breakfast., Disp: 30 capsule, Rfl: 11 .  metoprolol succinate (TOPROL XL) 25 MG 24 hr tablet, Take 0.5 tablets (12.5 mg total) by mouth daily., Disp: 15 tablet, Rfl: 6 .  Multiple Vitamins-Minerals (MULTIVITAMIN WITH MINERALS)  tablet, Take 1 tablet by mouth daily., Disp: , Rfl:  .  neomycin-polymyxin-hydrocortisone (CORTISPORIN) 3.5-10000-1 OTIC suspension, Place 4 drops into both ears 3 (three) times daily., Disp: 10 mL, Rfl: 0 .  ondansetron (ZOFRAN) 4 MG tablet, Take 1 tablet by mouth as needed., Disp: , Rfl:  .  oxyCODONE-acetaminophen (PERCOCET) 10-325 MG tablet, Take 1 tablet by mouth 3 (three) times daily., Disp: , Rfl:  .  pantoprazole (PROTONIX) 40 MG tablet, TAKE 1 TABLET BY MOUTH TWICE A DAY (Patient taking differently: TAKE 40 mg TABLET BY MOUTH TWICE A DAY), Disp: 180 tablet, Rfl: 2 .  potassium chloride 20 MEQ/15ML (10%) SOLN, Take 30 mLs (40 mEq total) by mouth daily., Disp: 900 mL, Rfl: 0 .  potassium chloride SA (KLOR-CON M20) 20 MEQ tablet, Take 2 tablets (40 mEq total) by mouth daily., Disp: 180 tablet, Rfl: 3 .  PROAIR HFA 108 (90 Base) MCG/ACT inhaler, INHALE TWO puffs BY MOUTH EVERY 4 HOURS AS NEEDED FOR WHEEZING, Disp: 8.5 g, Rfl: 1 .  topiramate (TOPAMAX) 50 MG tablet, TAKE ONE TABLET BY MOUTH TWICE DAILY, Disp: 60 tablet, Rfl: 11 .  traZODone (DESYREL) 100 MG tablet, Take 100 mg by mouth at bedtime., Disp: , Rfl: 0 .  clotrimazole (LOTRIMIN) 1 % cream, Apply 1 application topically 2 (two) times daily., Disp: 113 g, Rfl: 2 .  estradiol (CLIMARA) 0.1 mg/24hr patch, Place 1 patch (0.1 mg total) onto the skin once a week., Disp: 4 patch, Rfl: 12 Allergies  Allergen Reactions  . Tetracyclines & Related Other (See Comments)    syncope  . Tramadol Other (See Comments)    seizures  . Ciprofloxacin Rash  . Codeine Itching  . Penicillins Hives and Rash    Has patient had a PCN reaction causing immediate rash, facial/tongue/throat swelling, SOB or lightheadedness with hypotension: No Has patient had a PCN reaction causing severe rash involving mucus membranes or skin necrosis: No Has patient had a PCN reaction that required hospitalization: No Has patient had a PCN reaction occurring within the last  10 years: No If all of the above answers are "NO", then may proceed with Cephalosporin use.    Social History   Tobacco Use  . Smoking status: Never Smoker  . Smokeless tobacco: Never Used  Substance Use Topics  . Alcohol use: No    Family History  Problem Relation Age of Onset  . Diabetes Mother   . COPD Mother   . Hypertension Mother   . Hyperlipidemia Mother   . Heart disease Father   . Hyperlipidemia  Father   . Hypertension Father   . Cancer Father   . Heart disease Brother   . Heart disease Daughter   . Heart disease Maternal Grandmother   . Hypertension Maternal Grandmother   . Heart disease Maternal Grandfather   . Kidney cancer Paternal Grandmother   . Heart disease Paternal Grandfather       Review of Systems  Constitutional: negative for fatigue and weight loss Respiratory: negative for cough and wheezing Cardiovascular: negative for chest pain, fatigue and palpitations Gastrointestinal: negative for abdominal pain and change in bowel habits Musculoskeletal:negative for myalgias Neurological: negative for gait problems and tremors Behavioral/Psych: negative for abusive relationship, depression Endocrine: positive for hot flashes Genitourinary:negative for abnormal menstrual periods, genital lesions, hot flashes, sexual problems and vaginal discharge Integument/breast: positive for rash in abdominal fold, and negative for breast lump, breast tenderness, nipple discharge and skin lesion(s)    Objective:       BP 127/81   Pulse 85   Ht 5\' 4"  (1.626 m)   Wt 226 lb 6.4 oz (102.7 kg)   BMI 38.86 kg/m  General:   alert  Skin:   no rash or abnormalities  Lungs:   clear to auscultation bilaterally  Heart:   regular rate and rhythm, S1, S2 normal, no murmur, click, rub or gallop  Breasts:   normal without suspicious masses, skin or nipple changes or axillary nodes  Abdomen:  yeast-like erythematous rash in abdominal fold. : No organomegaly. Soft, non-tender  and no hernia               Pelvic exam:  Deferred   Lab Review Urine pregnancy test Labs reviewed yes Radiologic studies reviewed yes  50% of 20 min visit spent on counseling and coordination of care.   Assessment:   1. Encounter for gynecological examination - doing well  2. Hypoestrogenism Rx: - estradiol (CLIMARA) 0.1 mg/24hr patch; Place 1 patch (0.1 mg total) onto the skin once a week.  Dispense: 4 patch; Refill: 12 - DG BONE DENSITY (DXA); Future  3. Screening breast examination Rx: - MM Digital Screening; Future  4. Candidal intertrigo in abdominal fold Rx: - clotrimazole (LOTRIMIN) 1 % cream; Apply 1 application topically 2 (two) times daily.  Dispense: 113 g; Refill: 2    Plan:    Education reviewed: calcium supplements, depression evaluation, low fat, low cholesterol diet, self breast exams, skin cancer screening and weight bearing exercise. Hormone replacement therapy: hormone replacement therapy: [place] 1mg /24 hrs Patch, prescription for 12 months and follow up appointment recommended. Mammogram ordered. Follow up in: 6 months.    Meds ordered this encounter  Medications  . estradiol (CLIMARA) 0.1 mg/24hr patch    Sig: Place 1 patch (0.1 mg total) onto the skin once a week.    Dispense:  4 patch    Refill:  12  . clotrimazole (LOTRIMIN) 1 % cream    Sig: Apply 1 application topically 2 (two) times daily.    Dispense:  113 g    Refill:  2   Orders Placed This Encounter  Procedures  . DG BONE DENSITY (DXA)    Standing Status:   Future    Standing Expiration Date:   06/20/2018    Order Specific Question:   Reason for Exam (SYMPTOM  OR DIAGNOSIS REQUIRED)    Answer:   Hypoestrogenism    Order Specific Question:   Is the patient pregnant?    Answer:   No    Order Specific  Question:   Preferred imaging location?    Answer:   [facility]  . MM Digital Screening    Standing Status:   Future    Standing Expiration Date:   06/20/2018    Order  Specific Question:   Reason for Exam (SYMPTOM  OR DIAGNOSIS REQUIRED)    Answer:   Screening    Order Specific Question:   Is the patient pregnant?    Answer:   No    Order Specific Question:   Preferred imaging location?    Answer:   [facility]

## 2017-04-27 ENCOUNTER — Ambulatory Visit (INDEPENDENT_AMBULATORY_CARE_PROVIDER_SITE_OTHER): Payer: 59 | Admitting: Cardiovascular Disease

## 2017-04-27 ENCOUNTER — Encounter: Payer: Self-pay | Admitting: Cardiovascular Disease

## 2017-04-27 VITALS — BP 140/86 | HR 71 | Ht 64.0 in | Wt 229.0 lb

## 2017-04-27 DIAGNOSIS — I10 Essential (primary) hypertension: Secondary | ICD-10-CM | POA: Diagnosis not present

## 2017-04-27 DIAGNOSIS — I208 Other forms of angina pectoris: Secondary | ICD-10-CM

## 2017-04-27 NOTE — Patient Instructions (Signed)
Medication Instructions: Your physician recommends that you continue on your current medications as directed. Please refer to the Current Medication list given to you today.   Follow-Up: Your physician recommends that you schedule a follow-up appointment as needed with Dr. Berry.    

## 2017-04-27 NOTE — Assessment & Plan Note (Signed)
History of essential hypertension blood pressure measured at 140/86. She is on hydrochlorothiazide and metoprolol. Continue current meds at current dosing.

## 2017-04-27 NOTE — Progress Notes (Signed)
04/27/2017 MALAAK [name]   [birth year]  [id number]  Primary Physician Pickard, Cammie Mcgee, MD Primary Cardiologist: [name] MD [name] [name], [place]  HPI:  [name] [name] is a 52 y.o.  mildly overweight separated Caucasian female mother of 3 children, grandmother of 2 grandchildren who currently does not work. Her primary care provider is Dr. Dennard Schaumann . She was initially referred by the emergency room because of chest pain. Risk factors include treated hypertension and family history. She's never had a heart attack or stroke. She's had chest pain off and on for years more frequent recently. In the ER her EKG showed no acute changes. Enzymes are negative. The pain is brought on by tachypalpitations resulting in chest pain radiating to her jaw with presyncope. Eye. 2-D echocardiogram which was normal and event monitor that showed normal LV function. When I saw her last in the office she had had a syncopal episode in the waiting room and was transported to [facility] where she was observed for 48 hours and discharged. Telemetry was unrevealing. She was begun on low-dose metoprolol she says has improved many of her symptoms.     Current Meds  Medication Sig  . Artificial Tear Ointment (DRY EYES OP) Place 1 drop into both eyes daily as needed.  . busPIRone (BUSPAR) 5 MG tablet Take 5 mg by mouth 3 (three) times daily.  . Calcium Carbonate-Vit D-Min (CALCIUM 1200 PO) Take 1 tablet by mouth daily.  . citalopram (CELEXA) 40 MG tablet Take 40 mg by mouth at bedtime.   . clotrimazole (LOTRIMIN) 1 % cream Apply 1 application topically 2 (two) times daily.  . cyclobenzaprine (FLEXERIL) 10 MG tablet Take 10 mg by mouth 2 (two) times daily.   . EMBEDA 30-1.2 MG CPCR Take 1 capsule by mouth 2 (two) times daily.   Marland Kitchen estradiol (CLIMARA) 0.1 mg/24hr patch Place 1 patch (0.1 mg total) onto the skin once a week.  . fluticasone (FLONASE) 50 MCG/ACT nasal spray Place 2 sprays into both  nostrils daily. (Patient taking differently: Place 2 sprays into both nostrils daily as needed for allergies. )  . gabapentin (NEURONTIN) 600 MG tablet Take 1 tablet (600 mg total) by mouth 3 (three) times daily. For anxiety and pain management. May have to stop to take ECT.  . haloperidol (HALDOL) 5 MG tablet Take 1 tablet (5 mg total) by mouth at bedtime. For psychosis  . hydrochlorothiazide (HYDRODIURIL) 25 MG tablet TAKE ONE TABLET BY MOUTH DAILY  . LINZESS 290 MCG CAPS capsule Take 1 capsule (290 mcg total) by mouth daily before breakfast.  . metoprolol succinate (TOPROL XL) 25 MG 24 hr tablet Take 0.5 tablets (12.5 mg total) by mouth daily.  . Multiple Vitamins-Minerals (MULTIVITAMIN WITH MINERALS) tablet Take 1 tablet by mouth daily.  Marland Kitchen neomycin-polymyxin-hydrocortisone (CORTISPORIN) 3.5-10000-1 OTIC suspension Place 4 drops into both ears 3 (three) times daily.  Marland Kitchen oxyCODONE-acetaminophen (PERCOCET) 10-325 MG tablet Take 1 tablet by mouth 3 (three) times daily.  . pantoprazole (PROTONIX) 40 MG tablet TAKE 1 TABLET BY MOUTH TWICE A DAY (Patient taking differently: TAKE 40 mg TABLET BY MOUTH TWICE A DAY)  . potassium chloride 20 MEQ/15ML (10%) SOLN Take 30 mLs (40 mEq total) by mouth daily.  . potassium chloride SA (KLOR-CON M20) 20 MEQ tablet Take 2 tablets (40 mEq total) by mouth daily.  Marland Kitchen PROAIR HFA 108 (90 Base) MCG/ACT inhaler INHALE TWO puffs BY MOUTH EVERY 4 HOURS AS NEEDED FOR  WHEEZING  . topiramate (TOPAMAX) 50 MG tablet TAKE ONE TABLET BY MOUTH TWICE DAILY  . traZODone (DESYREL) 100 MG tablet Take 100 mg by mouth at bedtime.     Allergies  Allergen Reactions  . Tetracyclines & Related Other (See Comments)    syncope  . Tramadol Other (See Comments)    seizures  . Ciprofloxacin Rash  . Codeine Itching  . Penicillins Hives and Rash    Has patient had a PCN reaction causing immediate rash, facial/tongue/throat swelling, SOB or lightheadedness with hypotension: No Has patient  had a PCN reaction causing severe rash involving mucus membranes or skin necrosis: No Has patient had a PCN reaction that required hospitalization: No Has patient had a PCN reaction occurring within the last 10 years: No If all of the above answers are "NO", then may proceed with Cephalosporin use.    Social History   Socioeconomic History  . Marital status: Legally Separated    Spouse name: Not on file  . Number of children: 3  . Years of education: 30  . Highest education level: Not on file  Social Needs  . Financial resource strain: Not on file  . Food insecurity - worry: Not on file  . Food insecurity - inability: Not on file  . Transportation needs - medical: Not on file  . Transportation needs - non-medical: Not on file  Occupational History  . Occupation: Disabled  Tobacco Use  . Smoking status: Never Smoker  . Smokeless tobacco: Never Used  Substance and Sexual Activity  . Alcohol use: No  . Drug use: No  . Sexual activity: No    Birth control/protection: None  Other Topics Concern  . Not on file  Social History Narrative   Patient drink 4 16oz caffeine drinks a day      Review of Systems: General: negative for chills, fever, night sweats or weight changes.  Cardiovascular: negative for chest pain, dyspnea on exertion, edema, orthopnea, palpitations, paroxysmal nocturnal dyspnea or shortness of breath Dermatological: negative for rash Respiratory: negative for cough or wheezing Urologic: negative for hematuria Abdominal: negative for nausea, vomiting, diarrhea, bright red blood per rectum, melena, or hematemesis Neurologic: negative for visual changes, syncope, or dizziness All other systems reviewed and are otherwise negative except as noted above.    Blood pressure 140/86, pulse 71, height 5\' 4"  (1.626 m), weight 229 lb (103.9 kg).  General appearance: alert and no distress Neck: no adenopathy, no carotid bruit, no JVD, supple, symmetrical, trachea midline  and thyroid not enlarged, symmetric, no tenderness/mass/nodules Lungs: clear to auscultation bilaterally Heart: regular rate and rhythm, S1, S2 normal, no murmur, click, rub or gallop Extremities: extremities normal, atraumatic, no cyanosis or edema Pulses: 2+ and symmetric Skin: Skin color, texture, turgor normal. No rashes or lesions Neurologic: Alert and oriented X 3, normal strength and tone. Normal symmetric reflexes. Normal coordination and gait  EKG sinus rhythm at 71 without ST or T-wave changes. I personally reviewed this EKG.  ASSESSMENT AND PLAN:   Chest pain History of atypical chest pain in the past which has resolved  Essential hypertension History of essential hypertension blood pressure measured at 140/86. She is on hydrochlorothiazide and metoprolol. Continue current meds at current dosing.      [name] MD FACP,FACC,FAHA, [facility] 04/27/2017 2:59 PM

## 2017-04-27 NOTE — Assessment & Plan Note (Signed)
History of atypical chest pain in the past which has resolved

## 2017-05-02 ENCOUNTER — Ambulatory Visit: Payer: 59 | Admitting: Family Medicine

## 2017-05-05 ENCOUNTER — Ambulatory Visit (INDEPENDENT_AMBULATORY_CARE_PROVIDER_SITE_OTHER): Payer: 59 | Admitting: Family Medicine

## 2017-05-05 ENCOUNTER — Encounter: Payer: Self-pay | Admitting: Family Medicine

## 2017-05-05 VITALS — BP 128/70 | HR 90 | Temp 98.4°F | Resp 18 | Ht 63.5 in | Wt 228.0 lb

## 2017-05-05 DIAGNOSIS — I10 Essential (primary) hypertension: Secondary | ICD-10-CM | POA: Diagnosis not present

## 2017-05-05 DIAGNOSIS — Z23 Encounter for immunization: Secondary | ICD-10-CM

## 2017-05-05 DIAGNOSIS — G8929 Other chronic pain: Secondary | ICD-10-CM

## 2017-05-05 DIAGNOSIS — M25562 Pain in left knee: Secondary | ICD-10-CM

## 2017-05-05 NOTE — Progress Notes (Signed)
Subjective:    Patient ID: [name] [name], female    DOB: January 30, 1966, 52 y.o.   MRN: [id number]  HPI  Patient states that she is here for referral to her orthopedist.  She has been seeing them for quite some time.  She had cervical neck surgery and they discontinue meloxicam after to facilitate healing.  Since the discontinuation of meloxicam, the pain in her left knee has become unbearable.  She is requesting a cortisone injection under the care of her orthopedist.  Regarding her general medical care, she also has hypertension for which she takes hydrochlorothiazide along with metoprolol.  Her blood pressure today is well controlled.  She denies any chest pain, shortness of breath, dyspnea on exertion.  She is overdue to recheck her potassium.  She does require potassium replacement 40 mEq a day because of the hydrochlorothiazide.  She is also due to recheck a fasting lipid panel along with her blood sugar. Past Medical History:  Diagnosis Date  . Anxiety   . Asthma   . Bipolar 1 disorder ([place])    ect treatments last treatment Sep 02 1011  . Depression   . GERD (gastroesophageal reflux disease)   . Hypertension   . Seizures ([place])    last seizure was so long ago she can't remember.   . Sleep apnea    Past Surgical History:  Procedure Laterality Date  . ABDOMINAL HYSTERECTOMY    . BACK SURGERY    . CARPAL TUNNEL RELEASE     x2  . Laproscopic knee surgery    . NECK SURGERY  03/15/2017  . PLANTAR FASCIA RELEASE     x2   Current Outpatient Medications on File Prior to Visit  Medication Sig Dispense Refill  . Artificial Tear Ointment (DRY EYES OP) Place 1 drop into both eyes daily as needed.    . busPIRone (BUSPAR) 10 MG tablet Take 1 tablet by mouth three times a day For anxiety  2  . Calcium Carbonate-Vit D-Min (CALCIUM 1200 PO) Take 1 tablet by mouth daily.    . citalopram (CELEXA) 40 MG tablet Take 40 mg by mouth at bedtime.     . clotrimazole (LOTRIMIN) 1 % cream Apply 1  application topically 2 (two) times daily. 113 g 2  . cyclobenzaprine (FLEXERIL) 10 MG tablet Take 10 mg by mouth 2 (two) times daily.     . EMBEDA 30-1.2 MG CPCR Take 1 capsule by mouth 2 (two) times daily.     Marland Kitchen estradiol (CLIMARA) 0.1 mg/24hr patch Place 1 patch (0.1 mg total) onto the skin once a week. 4 patch 12  . fluticasone (FLONASE) 50 MCG/ACT nasal spray Place 2 sprays into both nostrils daily. (Patient taking differently: Place 2 sprays into both nostrils daily as needed for allergies. ) 48 g 2  . gabapentin (NEURONTIN) 600 MG tablet Take 1 tablet (600 mg total) by mouth 3 (three) times daily. For anxiety and pain management. May have to stop to take ECT. 90 tablet 0  . haloperidol (HALDOL) 5 MG tablet Take 1 tablet (5 mg total) by mouth at bedtime. For psychosis    . hydrochlorothiazide (HYDRODIURIL) 25 MG tablet TAKE ONE TABLET BY MOUTH DAILY 90 tablet 3  . LINZESS 290 MCG CAPS capsule Take 1 capsule (290 mcg total) by mouth daily before breakfast. 30 capsule 11  . metoprolol succinate (TOPROL XL) 25 MG 24 hr tablet Take 0.5 tablets (12.5 mg total) by mouth daily. 15 tablet 6  .  Multiple Vitamins-Minerals (MULTIVITAMIN WITH MINERALS) tablet Take 1 tablet by mouth daily.    Marland Kitchen neomycin-polymyxin-hydrocortisone (CORTISPORIN) 3.5-10000-1 OTIC suspension Place 4 drops into both ears 3 (three) times daily. 10 mL 0  . oxyCODONE-acetaminophen (PERCOCET) 10-325 MG tablet Take 1 tablet by mouth 3 (three) times daily.    . pantoprazole (PROTONIX) 40 MG tablet TAKE 1 TABLET BY MOUTH TWICE A DAY (Patient taking differently: TAKE 40 mg TABLET BY MOUTH TWICE A DAY) 180 tablet 2  . potassium chloride SA (KLOR-CON M20) 20 MEQ tablet Take 2 tablets (40 mEq total) by mouth daily. 180 tablet 3  . PROAIR HFA 108 (90 Base) MCG/ACT inhaler INHALE TWO puffs BY MOUTH EVERY 4 HOURS AS NEEDED FOR WHEEZING 8.5 g 1  . topiramate (TOPAMAX) 50 MG tablet TAKE ONE TABLET BY MOUTH TWICE DAILY 60 tablet 11  . traZODone  (DESYREL) 100 MG tablet Take 100 mg by mouth at bedtime.  0  . [DISCONTINUED] lithium carbonate (LITHOBID) 300 MG CR tablet Take 1 tablet (300 mg total) by mouth every 12 (twelve) hours. For mood swings and depression 60 tablet 0   No current facility-administered medications on file prior to visit.    Allergies  Allergen Reactions  . Tetracyclines & Related Other (See Comments)    syncope  . Tramadol Other (See Comments)    seizures  . Ciprofloxacin Rash  . Codeine Itching  . Penicillins Hives and Rash    Has patient had a PCN reaction causing immediate rash, facial/tongue/throat swelling, SOB or lightheadedness with hypotension: No Has patient had a PCN reaction causing severe rash involving mucus membranes or skin necrosis: No Has patient had a PCN reaction that required hospitalization: No Has patient had a PCN reaction occurring within the last 10 years: No If all of the above answers are "NO", then may proceed with Cephalosporin use.   Social History   Socioeconomic History  . Marital status: Legally Separated    Spouse name: Not on file  . Number of children: 3  . Years of education: 48  . Highest education level: Not on file  Social Needs  . Financial resource strain: Not on file  . Food insecurity - worry: Not on file  . Food insecurity - inability: Not on file  . Transportation needs - medical: Not on file  . Transportation needs - non-medical: Not on file  Occupational History  . Occupation: Disabled  Tobacco Use  . Smoking status: Never Smoker  . Smokeless tobacco: Never Used  Substance and Sexual Activity  . Alcohol use: No  . Drug use: No  . Sexual activity: No    Birth control/protection: None  Other Topics Concern  . Not on file  Social History Narrative   Patient drink 4 16oz caffeine drinks a day     Review of Systems  All other systems reviewed and are negative.      Objective:   Physical Exam  Cardiovascular: Normal rate, regular rhythm and  normal heart sounds.  No murmur heard. Pulmonary/Chest: Effort normal and breath sounds normal. No respiratory distress. She has no wheezes. She has no rales.  Abdominal: Soft. Bowel sounds are normal. She exhibits no distension. There is no tenderness. There is no rebound.  Musculoskeletal: She exhibits no edema.       Left knee: She exhibits decreased range of motion. Tenderness found. Medial joint line and lateral joint line tenderness noted.  Vitals reviewed.         Assessment &  Plan:  Benign essential HTN - Plan: COMPLETE METABOLIC PANEL WITH GFR, Lipid panel  Chronic pain of left knee - Plan: Ambulatory referral to Orthopedic Surgery  I will gladly refer the patient back to her orthopedist for her left knee pain and a possible cortisone injection in the left knee.  Meanwhile her blood pressure today is well controlled.  I will check a CMP to monitor her renal function and potassium along with a fasting lipid panel.  Goal LDL cholesterol is less than 130.  Patient received her flu shot today.  Her mammogram is scheduled for later this month.  She is not due for colonoscopy until 2023.  She does not require a Pap smear because of her history of a hysterectomy

## 2017-05-05 NOTE — Addendum Note (Signed)
Addended by: Shary Decamp B on: 05/05/2017 05:09 PM   Modules accepted: Orders

## 2017-05-06 LAB — COMPLETE METABOLIC PANEL WITH GFR
AG Ratio: 1.7 (calc) (ref 1.0–2.5)
ALT: 14 U/L (ref 6–29)
AST: 18 U/L (ref 10–35)
Albumin: 4.1 g/dL (ref 3.6–5.1)
Alkaline phosphatase (APISO): 91 U/L (ref 33–130)
BUN: 10 mg/dL (ref 7–25)
CO2: 28 mmol/L (ref 20–32)
Calcium: 9.3 mg/dL (ref 8.6–10.4)
Chloride: 104 mmol/L (ref 98–110)
Creat: 0.56 mg/dL (ref 0.50–1.05)
GFR, Est African American: 123 mL/min/{1.73_m2} (ref >=60)
GFR, Est Non African American: 106 mL/min/{1.73_m2} (ref >=60)
Globulin: 2.4 g/dL (calc) (ref 1.9–3.7)
Glucose, Bld: 154 mg/dL — ABNORMAL HIGH (ref 65–99)
Potassium: 3.6 mmol/L (ref 3.5–5.3)
Sodium: 139 mmol/L (ref 135–146)
Total Bilirubin: 0.4 mg/dL (ref 0.2–1.2)
Total Protein: 6.5 g/dL (ref 6.1–8.1)

## 2017-05-06 LAB — EXTRA LAV TOP TUBE

## 2017-05-06 LAB — LIPID PANEL
Cholesterol: 195 mg/dL (ref <200)
HDL: 50 mg/dL — ABNORMAL LOW (ref >50)
LDL Cholesterol (Calc): 109 mg/dL (calc) — ABNORMAL HIGH
Non-HDL Cholesterol (Calc): 145 mg/dL (calc) — ABNORMAL HIGH (ref <130)
Total CHOL/HDL Ratio: 3.9 (calc) (ref <5.0)
Triglycerides: 237 mg/dL — ABNORMAL HIGH (ref <150)

## 2017-05-10 ENCOUNTER — Encounter: Payer: Self-pay | Admitting: Family Medicine

## 2017-05-16 ENCOUNTER — Ambulatory Visit
Admission: RE | Admit: 2017-05-16 | Discharge: 2017-05-16 | Disposition: A | Discharge status: Home / Self Care | Payer: 59 | Source: Ambulatory Visit | Attending: Obstetrics | Admitting: Obstetrics

## 2017-05-16 DIAGNOSIS — Z1239 Encounter for other screening for malignant neoplasm of breast: Secondary | ICD-10-CM

## 2017-05-16 DIAGNOSIS — E2839 Other primary ovarian failure: Secondary | ICD-10-CM

## 2017-06-13 ENCOUNTER — Ambulatory Visit: Payer: 59 | Admitting: Podiatry

## 2017-06-18 ENCOUNTER — Encounter: Payer: Self-pay | Admitting: Family Medicine

## 2017-06-20 ENCOUNTER — Other Ambulatory Visit: Payer: Self-pay | Admitting: Obstetrics

## 2017-06-20 ENCOUNTER — Other Ambulatory Visit: Payer: Self-pay | Admitting: Family Medicine

## 2017-06-20 ENCOUNTER — Telehealth: Payer: Self-pay

## 2017-06-20 MED ORDER — LEVOCETIRIZINE DIHYDROCHLORIDE 5 MG PO TABS: 5.0000 mg | ORAL_TABLET | Freq: Every evening | ORAL | 5 refills | Status: DC

## 2017-06-20 NOTE — Telephone Encounter (Signed)
Rec'vd VM from pt c/o allergic reaction to Estradiol patch.

## 2017-06-20 NOTE — Telephone Encounter (Signed)
Pt called back. She stopped using the patch d/t body itching. Pt wants to start back using the Estrogen pills. Informed pt I will consult with MD and c/b.

## 2017-06-21 ENCOUNTER — Other Ambulatory Visit: Payer: Self-pay | Admitting: Obstetrics

## 2017-06-21 NOTE — Telephone Encounter (Signed)
LVM for pt to c/b. Attempting to inform pt consulted with provider. We will change patch to Vivelle Dot patch. Estradiol tabs are not recommended d/t risks.

## 2017-06-22 NOTE — Telephone Encounter (Signed)
Pt aware if she experiences an allergic reaction to this different patch, to discontinue it and contact the office.

## 2017-06-23 ENCOUNTER — Ambulatory Visit (HOSPITAL_COMMUNITY)
Admission: EM | Admit: 2017-06-23 | Discharge: 2017-06-23 | Disposition: A | Discharge status: Home / Self Care | Payer: 59 | Attending: Family Medicine | Admitting: Family Medicine

## 2017-06-23 ENCOUNTER — Encounter (HOSPITAL_COMMUNITY): Payer: Self-pay | Admitting: Family Medicine

## 2017-06-23 DIAGNOSIS — J45909 Unspecified asthma, uncomplicated: Secondary | ICD-10-CM | POA: Insufficient documentation

## 2017-06-23 DIAGNOSIS — F319 Bipolar disorder, unspecified: Secondary | ICD-10-CM | POA: Diagnosis not present

## 2017-06-23 DIAGNOSIS — Z880 Allergy status to penicillin: Secondary | ICD-10-CM | POA: Diagnosis not present

## 2017-06-23 DIAGNOSIS — N3090 Cystitis, unspecified without hematuria: Secondary | ICD-10-CM | POA: Diagnosis not present

## 2017-06-23 DIAGNOSIS — K219 Gastro-esophageal reflux disease without esophagitis: Secondary | ICD-10-CM | POA: Insufficient documentation

## 2017-06-23 DIAGNOSIS — Z885 Allergy status to narcotic agent status: Secondary | ICD-10-CM | POA: Insufficient documentation

## 2017-06-23 DIAGNOSIS — Z888 Allergy status to other drugs, medicaments and biological substances status: Secondary | ICD-10-CM | POA: Diagnosis not present

## 2017-06-23 DIAGNOSIS — F419 Anxiety disorder, unspecified: Secondary | ICD-10-CM | POA: Diagnosis not present

## 2017-06-23 DIAGNOSIS — R300 Dysuria: Secondary | ICD-10-CM | POA: Diagnosis present

## 2017-06-23 DIAGNOSIS — Z881 Allergy status to other antibiotic agents status: Secondary | ICD-10-CM | POA: Diagnosis not present

## 2017-06-23 DIAGNOSIS — G4730 Sleep apnea, unspecified: Secondary | ICD-10-CM | POA: Diagnosis not present

## 2017-06-23 DIAGNOSIS — R350 Frequency of micturition: Secondary | ICD-10-CM | POA: Diagnosis present

## 2017-06-23 DIAGNOSIS — Z90710 Acquired absence of both cervix and uterus: Secondary | ICD-10-CM | POA: Diagnosis not present

## 2017-06-23 DIAGNOSIS — I10 Essential (primary) hypertension: Secondary | ICD-10-CM | POA: Insufficient documentation

## 2017-06-23 DIAGNOSIS — R3915 Urgency of urination: Secondary | ICD-10-CM | POA: Diagnosis present

## 2017-06-23 LAB — POCT URINALYSIS DIP (DEVICE)
Bilirubin Urine: NEGATIVE
Glucose, UA: NEGATIVE mg/dL
Ketones, ur: NEGATIVE mg/dL
Nitrite: NEGATIVE
Protein, ur: NEGATIVE mg/dL
Specific Gravity, Urine: 1.02 (ref 1.005–1.030)
Urobilinogen, UA: 0.2 mg/dL (ref 0.0–1.0)
pH: 6 (ref 5.0–8.0)

## 2017-06-23 MED ORDER — SULFAMETHOXAZOLE-TRIMETHOPRIM 800-160 MG PO TABS: 1.0000 | ORAL_TABLET | Freq: Two times a day (BID) | ORAL | 0 refills | Status: DC

## 2017-06-23 MED ORDER — PROMETHAZINE HCL 25 MG PO TABS: 25.0000 mg | ORAL_TABLET | Freq: Four times a day (QID) | ORAL | 0 refills | Status: DC | PRN

## 2017-06-23 NOTE — ED Provider Notes (Signed)
[place]    ASSESSMENT & PLAN:  1. Cystitis    Meds ordered this encounter  Medications  . sulfamethoxazole-trimethoprim (BACTRIM DS,SEPTRA DS) 800-160 MG tablet    Sig: Take 1 tablet by mouth 2 (two) times daily for 5 days.    Dispense:  10 tablet    Refill:  0  . promethazine (PHENERGAN) 25 MG tablet    Sig: Take 1 tablet (25 mg total) by mouth every 6 (six) hours as needed for nausea or vomiting.    Dispense:  10 tablet    Refill:  0   Reports nausea when taking antibiotics; requests phenergan. Reports Zofran does not help.  Urine culture sent. Will notify patient when results available. Will follow up with her PCP or here if not showing improvement over the next 48 hours, sooner if needed.  Outlined signs and symptoms indicating need for more acute intervention. Patient verbalized understanding. After Visit Summary given.  SUBJECTIVE:  KARMEL PATRICELLI is a 52 y.o. female who complains of urinary frequency, urgency and dysuria for the past 1-2 days. No flank pain, fever, chills, abnormal vaginal discharge or bleeding. Hematuria: not present. Normal PO intake. No abdominal pain. No self treatment. Ambulatory without difficulty.  LMP: No LMP recorded. Patient has had a hysterectomy.  ROS: As in HPI.  OBJECTIVE:  Vitals:   06/23/17 1035  Pulse: (!) 103  Resp: 18  Temp: (!) 97.5 F (36.4 C)  SpO2: 100%   Appears well, in no apparent distress. Abdomen is soft without tenderness, guarding, mass, rebound or organomegaly. No CVA tenderness or inguinal adenopathy noted.  Labs Reviewed  POCT URINALYSIS DIP (DEVICE) - Abnormal; Notable for the following components:      Result Value   Hgb urine dipstick SMALL (*)    Leukocytes, UA SMALL (*)    All other components within normal limits  URINE CULTURE    Allergies  Allergen Reactions  . Tetracyclines & Related Other (See Comments)    syncope  . Tramadol Other (See Comments)    seizures  .  Ciprofloxacin Rash  . Codeine Itching  . Penicillins Hives and Rash    Has patient had a PCN reaction causing immediate rash, facial/tongue/throat swelling, SOB or lightheadedness with hypotension: No Has patient had a PCN reaction causing severe rash involving mucus membranes or skin necrosis: No Has patient had a PCN reaction that required hospitalization: No Has patient had a PCN reaction occurring within the last 10 years: No If all of the above answers are "NO", then may proceed with Cephalosporin use.    Past Medical History:  Diagnosis Date  . Anxiety   . Asthma   . Bipolar 1 disorder ([place])    ect treatments last treatment Sep 02 1011  . Depression   . GERD (gastroesophageal reflux disease)   . Hypertension   . Seizures ([place])    last seizure was so long ago she can't remember.   . Sleep apnea    Social History   Socioeconomic History  . Marital status: Legally Separated    Spouse name: Not on file  . Number of children: 3  . Years of education: 56  . Highest education level: Not on file  Social Needs  . Financial resource strain: Not on file  . Food insecurity - worry: Not on file  . Food insecurity - inability: Not on file  . Transportation needs - medical: Not on file  . Transportation needs - non-medical: Not on  file  Occupational History  . Occupation: Disabled  Tobacco Use  . Smoking status: Never Smoker  . Smokeless tobacco: Never Used  Substance and Sexual Activity  . Alcohol use: No  . Drug use: No  . Sexual activity: No    Birth control/protection: None  Other Topics Concern  . Not on file  Social History Narrative   Patient drink 4 16oz caffeine drinks a day    Family History  Problem Relation Age of Onset  . Diabetes Mother   . COPD Mother   . Hypertension Mother   . Hyperlipidemia Mother   . Heart disease Father   . Hyperlipidemia Father   . Hypertension Father   . Cancer Father   . Heart disease Brother   . Heart disease Daughter   .  Heart disease Maternal Grandmother   . Hypertension Maternal Grandmother   . Heart disease Maternal Grandfather   . Kidney cancer Paternal Grandmother   . Heart disease Paternal Reymundo Poll, MD 06/23/17 1119

## 2017-06-23 NOTE — ED Triage Notes (Addendum)
Pt here for dysuria, cloudy urine and foul odor since yesterday. No vaginal symptoms. Denies abd pain or new or worsening back pain. Denies fevers, chills.

## 2017-06-25 LAB — URINE CULTURE: Culture: >=100000 — AB

## 2017-06-26 ENCOUNTER — Telehealth (HOSPITAL_COMMUNITY): Payer: Self-pay | Admitting: Internal Medicine

## 2017-06-26 MED ORDER — NITROFURANTOIN MONOHYD MACRO 100 MG PO CAPS: 100.0000 mg | ORAL_CAPSULE | Freq: Two times a day (BID) | ORAL | 0 refills | Status: DC

## 2017-06-26 MED ORDER — PROMETHAZINE HCL 25 MG PO TABS: 25.0000 mg | ORAL_TABLET | Freq: Four times a day (QID) | ORAL | 0 refills | Status: DC | PRN

## 2017-06-26 NOTE — Telephone Encounter (Signed)
Clinical staff, please let patient know that urine culture was positive for E coli germ, resistant to trimethoprim/sulfa prescription given at the urgent care visit but sensitive to nitrofurantoin.   Stop trimethoprim/sulfa.  Rx nitrofurantoin sent to the pharmacy of record, [facility] on [address]. Additional promethazine for nausea with abx sent also.  Recheck or followup with your primary care provider for further evaluation if symptoms are not improving.  LM

## 2017-06-27 ENCOUNTER — Telehealth (HOSPITAL_COMMUNITY): Payer: Self-pay | Admitting: *Deleted

## 2017-06-27 ENCOUNTER — Encounter: Payer: Self-pay | Admitting: Podiatry

## 2017-06-27 ENCOUNTER — Ambulatory Visit (INDEPENDENT_AMBULATORY_CARE_PROVIDER_SITE_OTHER): Payer: 59 | Admitting: Podiatry

## 2017-06-27 DIAGNOSIS — L97501 Non-pressure chronic ulcer of other part of unspecified foot limited to breakdown of skin: Secondary | ICD-10-CM | POA: Diagnosis not present

## 2017-06-27 NOTE — Telephone Encounter (Signed)
Results reviewed with patient. Patient reports picking up prescription for new antibiotic this am, states she is not feeling better yet. Encouraged patient to stay hydrated and take antibiotic as prescribed. Patient to call back if she isn't feeling bette in 3 days.

## 2017-07-02 NOTE — Progress Notes (Signed)
Subjective: [name] [name] presents the office today for concerns of a small wound on the bottom of her right big toe.  She states that she had a blister which popped yesterday and she noticed the area formed.  She denies any recent injury or trauma she has not changed shoes.  She denies any redness or red streaks or any swelling to her foot.  She has no other concerns today. Denies any systemic complaints such as fevers, chills, nausea, vomiting. No acute changes since last appointment, and no other complaints at this time.   Objective: AAO x3, NAD DP/PT pulses palpable bilaterally, CRT less than 3 seconds On the plantar aspect of the right hallux is a superficial granular wound measuring approximately 1.2 x 1 cm and is very superficial limited to the breakdown of the skin.  There is no probing, undermining or tunneling.  There is no fluctuation or crepitation.  There is no malodor.  There is no signs of infection or blistering today. No open lesions or pre-ulcerative lesions.  No pain with calf compression, swelling, warmth, erythema  Assessment: Right plantar hallux ulceration  Plan: -All treatment options discussed with the patient including all alternatives, risks, complications.  -Wound was cleaned today.  I want her to continue with Silvadene dressing changes daily.  She states that she still has this at home.  Continue offloading at all times. -Monitor for any clinical signs or symptoms of infection and directed to call the office immediately should any occur or go to the ER. -Follow-up as scheduled or sooner if needed.  She agrees with this plan has no further questions -Patient encouraged to call the office with any questions, concerns, change in symptoms.   [name] DPM

## 2017-07-05 ENCOUNTER — Ambulatory Visit (INDEPENDENT_AMBULATORY_CARE_PROVIDER_SITE_OTHER): Payer: 59 | Admitting: Family Medicine

## 2017-07-05 VITALS — BP 110/64 | HR 80 | Temp 97.6°F | Resp 16 | Ht 63.5 in | Wt 234.0 lb

## 2017-07-05 DIAGNOSIS — N390 Urinary tract infection, site not specified: Secondary | ICD-10-CM | POA: Diagnosis not present

## 2017-07-05 LAB — URINALYSIS, ROUTINE W REFLEX MICROSCOPIC
Bilirubin Urine: NEGATIVE
Glucose, UA: NEGATIVE
Hyaline Cast: NONE SEEN /LPF
Ketones, ur: NEGATIVE
Leukocytes, UA: NEGATIVE
Nitrite: NEGATIVE
Protein, ur: NEGATIVE
RBC / HPF: NONE SEEN /HPF (ref 0–2)
Specific Gravity, Urine: 1.015 (ref 1.001–1.03)
WBC, UA: NONE SEEN /HPF (ref 0–5)
pH: 5.5 (ref 5.0–8.0)

## 2017-07-05 LAB — MICROSCOPIC MESSAGE

## 2017-07-05 MED ORDER — CIPROFLOXACIN HCL 500 MG PO TABS: 500.0000 mg | ORAL_TABLET | Freq: Two times a day (BID) | ORAL | 0 refills | Status: DC

## 2017-07-05 NOTE — Progress Notes (Signed)
Subjective:    Patient ID: [name] [name], female    DOB: [birth year], 52 y.o.   MRN: [id number]  HPI  Patient was recently seen in urgent care and diagnosed with urinary tract infection.  Culture confirmed E. coli that was sensitive to Macrobid.  Patient was treated with 5 days of Macrobid.  Symptoms improved but she continues to have residual symptoms including dysuria, bladder spasms, frequency, and urgency.  Urinalysis today is unremarkable.  She has been off antibiotics for 48 hours Past Medical History:  Diagnosis Date  . Anxiety   . Asthma   . Bipolar 1 disorder ([place])    ect treatments last treatment Sep 02 1011  . Depression   . GERD (gastroesophageal reflux disease)   . Hypertension   . Seizures ([place])    last seizure was so long ago she can't remember.   . Sleep apnea    Past Surgical History:  Procedure Laterality Date  . ABDOMINAL HYSTERECTOMY    . BACK SURGERY    . CARPAL TUNNEL RELEASE     x2  . Laproscopic knee surgery    . NECK SURGERY  03/15/2017  . PLANTAR FASCIA RELEASE     x2   Current Outpatient Medications on File Prior to Visit  Medication Sig Dispense Refill  . Artificial Tear Ointment (DRY EYES OP) Place 1 drop into both eyes daily as needed.    . busPIRone (BUSPAR) 10 MG tablet Take 1 tablet by mouth three times a day For anxiety  2  . Calcium Carbonate-Vit D-Min (CALCIUM 1200 PO) Take 1 tablet by mouth daily.    . citalopram (CELEXA) 40 MG tablet Take 40 mg by mouth at bedtime.     . clotrimazole (LOTRIMIN) 1 % cream Apply 1 application topically 2 (two) times daily. 113 g 2  . cyclobenzaprine (FLEXERIL) 10 MG tablet Take 10 mg by mouth 2 (two) times daily.     . EMBEDA 30-1.2 MG CPCR Take 1 capsule by mouth 2 (two) times daily.     . fluticasone (FLONASE) 50 MCG/ACT nasal spray Place 2 sprays into both nostrils daily. (Patient taking differently: Place 2 sprays into both nostrils daily as needed for allergies. ) 48 g 2  . gabapentin (NEURONTIN)  600 MG tablet Take 1 tablet (600 mg total) by mouth 3 (three) times daily. For anxiety and pain management. May have to stop to take ECT. 90 tablet 0  . haloperidol (HALDOL) 5 MG tablet Take 1 tablet (5 mg total) by mouth at bedtime. For psychosis    . hydrochlorothiazide (HYDRODIURIL) 25 MG tablet TAKE ONE TABLET BY MOUTH DAILY 90 tablet 3  . levocetirizine (XYZAL) 5 MG tablet Take 1 tablet (5 mg total) by mouth every evening. 30 tablet 5  . LINZESS 290 MCG CAPS capsule Take 1 capsule (290 mcg total) by mouth daily before breakfast. 30 capsule 11  . LORazepam (ATIVAN) 1 MG tablet TAKE ONE TABLET BY MOUTH IN THE EVENING BEFORE GOING TO BED AND ONE TABLET ONE HOUR PRIOR TO YOUR APPOINTMENT  0  . metoprolol succinate (TOPROL XL) 25 MG 24 hr tablet Take 0.5 tablets (12.5 mg total) by mouth daily. 15 tablet 6  . Multiple Vitamins-Minerals (MULTIVITAMIN WITH MINERALS) tablet Take 1 tablet by mouth daily.    Marland Kitchen neomycin-polymyxin-hydrocortisone (CORTISPORIN) 3.5-10000-1 OTIC suspension Place 4 drops into both ears 3 (three) times daily. 10 mL 0  . oxyCODONE-acetaminophen (PERCOCET) 10-325 MG tablet Take 1 tablet by mouth 3 (three)  times daily.    . pantoprazole (PROTONIX) 40 MG tablet TAKE 1 TABLET BY MOUTH TWICE A DAY (Patient taking differently: TAKE 40 mg TABLET BY MOUTH TWICE A DAY) 180 tablet 2  . potassium chloride SA (KLOR-CON M20) 20 MEQ tablet Take 2 tablets (40 mEq total) by mouth daily. 180 tablet 3  . PROAIR HFA 108 (90 Base) MCG/ACT inhaler INHALE TWO puffs BY MOUTH EVERY 4 HOURS AS NEEDED FOR WHEEZING 8.5 g 1  . promethazine (PHENERGAN) 25 MG tablet Take 1 tablet (25 mg total) by mouth every 6 (six) hours as needed for nausea or vomiting. 10 tablet 0  . topiramate (TOPAMAX) 50 MG tablet TAKE ONE TABLET BY MOUTH TWICE DAILY 60 tablet 11  . traZODone (DESYREL) 100 MG tablet Take 100 mg by mouth at bedtime.  0   No current facility-administered medications on file prior to visit.    Allergies    Allergen Reactions  . Tetracyclines & Related Other (See Comments)    syncope  . Tramadol Other (See Comments)    seizures  . Ciprofloxacin Rash  . Codeine Itching  . Penicillins Hives and Rash    Has patient had a PCN reaction causing immediate rash, facial/tongue/throat swelling, SOB or lightheadedness with hypotension: No Has patient had a PCN reaction causing severe rash involving mucus membranes or skin necrosis: No Has patient had a PCN reaction that required hospitalization: No Has patient had a PCN reaction occurring within the last 10 years: No If all of the above answers are "NO", then may proceed with Cephalosporin use.   Social History   Socioeconomic History  . Marital status: Legally Separated    Spouse name: Not on file  . Number of children: 3  . Years of education: 13  . Highest education level: Not on file  Social Needs  . Financial resource strain: Not on file  . Food insecurity - worry: Not on file  . Food insecurity - inability: Not on file  . Transportation needs - medical: Not on file  . Transportation needs - non-medical: Not on file  Occupational History  . Occupation: Disabled  Tobacco Use  . Smoking status: Never Smoker  . Smokeless tobacco: Never Used  Substance and Sexual Activity  . Alcohol use: No  . Drug use: No  . Sexual activity: No    Birth control/protection: None  Other Topics Concern  . Not on file  Social History Narrative   Patient drink 4 16oz caffeine drinks a day      Review of Systems  All other systems reviewed and are negative.      Objective:   Physical Exam  Cardiovascular: Normal rate, regular rhythm and normal heart sounds.  Pulmonary/Chest: Effort normal and breath sounds normal. No respiratory distress. She has no wheezes.  Abdominal: Soft. Bowel sounds are normal.  Vitals reviewed.         Assessment & Plan:  Urinary tract infection without hematuria, site unspecified - Plan: Urinalysis, Routine w  reflex microscopic, Urine Culture  Patient was treated with the appropriate antibiotic for 5 days.  Urinalysis today is normal.  I have recommended awaiting culture results prior to treating with any additional antibiotics.  I recommended that she push fluids to try to help calm any residual irritation in the bladder wall.  If symptoms worsen, I would treat her with Cipro 500 mg p.o. twice daily for 5 days.  However we will withhold treatment until culture results return in 48-72 hours.  Patient is okay with this plan

## 2017-07-07 ENCOUNTER — Encounter: Payer: Self-pay | Admitting: Family Medicine

## 2017-07-07 LAB — URINE CULTURE
MICRO NUMBER:: 90347519
SPECIMEN QUALITY:: ADEQUATE

## 2017-07-08 ENCOUNTER — Other Ambulatory Visit: Payer: Self-pay | Admitting: Family Medicine

## 2017-07-11 ENCOUNTER — Ambulatory Visit (INDEPENDENT_AMBULATORY_CARE_PROVIDER_SITE_OTHER): Payer: 59

## 2017-07-11 ENCOUNTER — Encounter: Payer: Self-pay | Admitting: Podiatry

## 2017-07-11 ENCOUNTER — Ambulatory Visit (INDEPENDENT_AMBULATORY_CARE_PROVIDER_SITE_OTHER): Payer: 59 | Admitting: Podiatry

## 2017-07-11 VITALS — BP 91/55 | HR 79 | Temp 97.1°F | Resp 18

## 2017-07-11 DIAGNOSIS — L97501 Non-pressure chronic ulcer of other part of unspecified foot limited to breakdown of skin: Secondary | ICD-10-CM

## 2017-07-11 MED ORDER — SULFAMETHOXAZOLE-TRIMETHOPRIM 800-160 MG PO TABS: 1.0000 | ORAL_TABLET | Freq: Two times a day (BID) | ORAL | 0 refills | Status: DC

## 2017-07-13 NOTE — Progress Notes (Signed)
Subjective: 52 year old female presents the office today for follow-up evaluation of ulceration on the right hallux.  He denies any drainage or pus coming from it.  She is been putting Silvadene on the wound daily.  Denies any increase in redness or any other new issues.  She is remained in the surgical shoe. Denies any systemic complaints such as fevers, chills, nausea, vomiting. No acute changes since last appointment, and no other complaints at this time.   Objective: AAO x3, NAD DP/PT pulses palpable bilaterally, CRT less than 3 seconds Ulceration present on the plantar aspect of the right hallux measuring 0.8 x 0.6 x 0.2 cm.  There is a granular base and there is no probing, undermining or tunneling.  There is some erythema but there is no ascending cellulitis.  No drainage or pus or any fluctuation or crepitation.  There is no malodor.  There is no clinical signs of infection otherwise. No open lesions or pre-ulcerative lesions.  No pain with calf compression, swelling, warmth, erythema  Assessment: Ulceration right hallux with mild erythema   Plan: -All treatment options discussed with the patient including all alternatives, risks, complications.  -Sharpy debrided the wound to healthy, granular tissue to the any complications.  There is mild bleeding I did use silver nitrate to stop this.  Continue with Silvadene dressing changes daily.  X-rays were again reviewed which not reveal any evidence of cortical destruction suggestive of osteomyelitis there is no soft tissue emphysema present. -Continue surgical shoe and offloading at all times. -Patient encouraged to call the office with any questions, concerns, change in symptoms.   [name] DPM

## 2017-07-18 ENCOUNTER — Telehealth: Payer: Self-pay | Admitting: Podiatry

## 2017-07-18 ENCOUNTER — Other Ambulatory Visit: Payer: Self-pay | Admitting: Obstetrics

## 2017-07-18 ENCOUNTER — Other Ambulatory Visit: Payer: Self-pay | Admitting: Podiatry

## 2017-07-18 MED ORDER — MAGIC MOUTHWASH: 5.0000 mL | Freq: Three times a day (TID) | ORAL | 0 refills | Status: DC | PRN

## 2017-07-18 NOTE — Telephone Encounter (Signed)
I'm a pt of Dr. Leigh Aurora. I've developed thrash from taking antibiotics and I need some magic mouthwash called in. My pharmacy is [facility]. If you could give me a call back at [phone]. Thank you. Bye bye.

## 2017-07-18 NOTE — Telephone Encounter (Signed)
Left message informing pt the rx had been sent to the [facility].

## 2017-07-19 NOTE — Telephone Encounter (Signed)
Pt states the pharmacy only received orders for Bactrim. I told pt I had received the hardcopy order from Dr. Jacqualyn Posey after 5:00pm yesterday and had faxed to the [facility] at 5:13pm. I instructed pt to call the pharmacy again, I had confirmation they received the magic mouthwash rx.

## 2017-07-21 MED ORDER — MAGIC MOUTHWASH: ORAL | 0 refills | Status: DC

## 2017-07-21 NOTE — Telephone Encounter (Signed)
[facility] states they do not had the formula for the [facility] and the amount order is improper.

## 2017-07-21 NOTE — Addendum Note (Signed)
Addended by: Harriett Sine D on: 07/21/2017 09:16 AM   Modules accepted: Orders

## 2017-07-21 NOTE — Telephone Encounter (Addendum)
Dr. Jacqualyn Posey ordered [facility] Magic Mouthwash containing Hydrocortisone 60mg , Nystatin 109ml, and diphenhydramine 12.5mg /31ml, #172ml 1 teaspoon swish and spit 3 x day.

## 2017-07-22 ENCOUNTER — Encounter: Payer: Self-pay | Admitting: Podiatry

## 2017-07-22 ENCOUNTER — Ambulatory Visit (INDEPENDENT_AMBULATORY_CARE_PROVIDER_SITE_OTHER): Payer: 59 | Admitting: Podiatry

## 2017-07-22 VITALS — Temp 97.6°F

## 2017-07-22 DIAGNOSIS — L97501 Non-pressure chronic ulcer of other part of unspecified foot limited to breakdown of skin: Secondary | ICD-10-CM | POA: Diagnosis not present

## 2017-07-25 NOTE — Progress Notes (Signed)
Subjective: 52 year old female presents the office today for follow-up evaluation of ulceration on the right hallux.  She states the area is still bleeding but she denies any pus.  She denies any increase in redness or swelling.  She has remained in the surgical shoe.  She has been putting Silvadene on the area daily. Denies any systemic complaints such as fevers, chills, nausea, vomiting. No acute changes since last appointment, and no other complaints at this time.   Objective: AAO x3, NAD DP/PT pulses palpable bilaterally, CRT less than 3 seconds Ulceration present on the plantar aspect of the right hallux measuring 0.7 x 0.5 x 0.1 cm.  Wound appears somewhat smaller but does remain.  Mild swelling to the toe but this is been chronic.  There is no significant erythema there is no ascending cellulitis.  There is no fluctuation or crepitation.  There is no malodor.  No other open lesions. No pain with calf compression, swelling, warmth, erythema  Assessment: Ulceration right hallux with improved erythema   Plan: -All treatment options discussed with the patient including all alternatives, risks, complications.  -The wound was cleaned today.  There is no active bleeding present.  Silver nitrate was applied.  Also applied Actisorb dressing and I want her to continue with this at home as well.  Continue offloading at all times.  Finish course of Bactrim.  Will hold any further antibiotics. -RTC 1 week or sooner if needed.  -Monitor for any clinical signs or symptoms of infection and directed to call the office immediately should any occur or go to the ER.  [name] DPM

## 2017-07-29 ENCOUNTER — Telehealth: Payer: Self-pay | Admitting: Podiatry

## 2017-07-29 ENCOUNTER — Encounter: Payer: Self-pay | Admitting: Family Medicine

## 2017-07-29 ENCOUNTER — Other Ambulatory Visit: Payer: Self-pay | Admitting: Podiatry

## 2017-07-29 MED ORDER — FLUCONAZOLE 150 MG PO TABS: 150.0000 mg | ORAL_TABLET | Freq: Once | ORAL | 0 refills | Status: AC

## 2017-07-29 MED ORDER — SULFAMETHOXAZOLE-TRIMETHOPRIM 800-160 MG PO TABS: 1.0000 | ORAL_TABLET | Freq: Two times a day (BID) | ORAL | 0 refills | Status: DC

## 2017-07-29 MED ORDER — NYSTATIN 100000 UNIT/ML MT SUSP: 5.0000 mL | Freq: Three times a day (TID) | OROMUCOSAL | 0 refills | Status: DC | PRN

## 2017-07-29 NOTE — Telephone Encounter (Signed)
I informed pt the antibiotic had been sent to the pharmacy and to keep her appt Monday.

## 2017-07-29 NOTE — Telephone Encounter (Signed)
I'm calling for [place]. She has a blister/wound on her toe and now she has one on her left foot. Now both got real circled red around it and I think maybe she might need some antibiotics or something. I didn't her to wait until Monday when she comes in. The phone number is [phone]. The pharmacy is [facility]. Thank you.

## 2017-07-29 NOTE — Telephone Encounter (Signed)
I sent Bactrim to her pharmacy. It it gets any worse over the weekend she needs to go to the ER

## 2017-08-01 ENCOUNTER — Ambulatory Visit (INDEPENDENT_AMBULATORY_CARE_PROVIDER_SITE_OTHER): Payer: 59 | Admitting: Podiatry

## 2017-08-01 ENCOUNTER — Encounter: Payer: Self-pay | Admitting: Podiatry

## 2017-08-01 VITALS — BP 139/77 | HR 78 | Temp 98.4°F | Resp 18

## 2017-08-01 DIAGNOSIS — M2031 Hallux varus (acquired), right foot: Secondary | ICD-10-CM | POA: Diagnosis not present

## 2017-08-01 DIAGNOSIS — L97501 Non-pressure chronic ulcer of other part of unspecified foot limited to breakdown of skin: Secondary | ICD-10-CM

## 2017-08-01 MED ORDER — MUPIROCIN 2 % EX OINT: 1.0000 "application " | TOPICAL_OINTMENT | Freq: Two times a day (BID) | CUTANEOUS | 2 refills | Status: DC

## 2017-08-01 NOTE — Patient Instructions (Signed)
Put a SMALL amount of antibiotic ointment on the wound twice a day and cover with gauze.  I have ordered a blood flow study to check your circulation Monitor for any signs/symptoms of infection. Call the office immediately if any occur or go directly to the emergency room. Call with any questions/concerns.

## 2017-08-02 ENCOUNTER — Encounter: Payer: Self-pay | Admitting: Podiatry

## 2017-08-02 ENCOUNTER — Telehealth: Payer: Self-pay | Admitting: *Deleted

## 2017-08-02 DIAGNOSIS — L97501 Non-pressure chronic ulcer of other part of unspecified foot limited to breakdown of skin: Secondary | ICD-10-CM

## 2017-08-02 LAB — CBC WITH DIFFERENTIAL/PLATELET
Basophils Absolute: 18 cells/uL (ref 0–200)
Basophils Relative: 0.2 %
Eosinophils Absolute: 44 cells/uL (ref 15–500)
Eosinophils Relative: 0.5 %
HCT: 35.8 % (ref 35.0–45.0)
Hemoglobin: 12.4 g/dL (ref 11.7–15.5)
Lymphs Abs: 2244 cells/uL (ref 850–3900)
MCH: 32.4 pg (ref 27.0–33.0)
MCHC: 34.6 g/dL (ref 32.0–36.0)
MCV: 93.5 fL (ref 80.0–100.0)
MPV: 9 fL (ref 7.5–12.5)
Monocytes Relative: 7.3 %
Neutro Abs: 5852 cells/uL (ref 1500–7800)
Neutrophils Relative %: 66.5 %
Platelets: 372 10*3/uL (ref 140–400)
RBC: 3.83 10*6/uL (ref 3.80–5.10)
RDW: 13.2 % (ref 11.0–15.0)
Total Lymphocyte: 25.5 %
WBC mixed population: 642 cells/uL (ref 200–950)
WBC: 8.8 10*3/uL (ref 3.8–10.8)

## 2017-08-02 LAB — C-REACTIVE PROTEIN: CRP: 7.7 mg/L (ref <8.0)

## 2017-08-02 LAB — BASIC METABOLIC PANEL
BUN: 14 mg/dL (ref 7–25)
CO2: 24 mmol/L (ref 20–32)
Calcium: 9.4 mg/dL (ref 8.6–10.4)
Chloride: 105 mmol/L (ref 98–110)
Creat: 0.82 mg/dL (ref 0.50–1.05)
Glucose, Bld: 79 mg/dL (ref 65–99)
Potassium: 4 mmol/L (ref 3.5–5.3)
Sodium: 138 mmol/L (ref 135–146)

## 2017-08-02 LAB — SEDIMENTATION RATE: Sed Rate: 50 mm/h — ABNORMAL HIGH (ref 0–30)

## 2017-08-02 NOTE — Telephone Encounter (Addendum)
-----   Message from [name], DPM sent at 08/01/2017  3:37 PM EDT ----- Can you please ABI and arterial duplex bilaterally due to ulcerations? Thanks. Faxed to [facility].

## 2017-08-02 NOTE — Telephone Encounter (Signed)
-----  Message from Matthew R Wagoner, DPM sent at 08/02/2017  5:14 PM EDT ----- ESR,glucose is high but other labs normal (she was not fasting). Please let her know! 

## 2017-08-02 NOTE — Telephone Encounter (Signed)
Unable to leave a message wireless customer is unavailable try again later.

## 2017-08-03 NOTE — Telephone Encounter (Signed)
-----   Message from Baxter Flattery sent at 08/03/2017 10:33 AM EDT ----- Regarding: Precert [name] , I have her schedule for 4/26 she has Medicaid . Can you get pre-authorization?

## 2017-08-03 NOTE — Progress Notes (Signed)
Subjective: 52 year old female presents the office today for follow-up evaluation of a wound to the bottom of the right big toe.  She states that last week she started noticed that the skin on top of the right big toe which started to peel off and become more red.  She also has noticed a wound to form on the bottom of the left big toe which started last week.  She did call the office was started on Bactrim which is been taking she states that since starting the antibiotic the redness around the area has much improved in both toes.  She denies any drainage or pus or any recent injury or trauma. Denies any systemic complaints such as fevers, chills, nausea, vomiting. No acute changes since last appointment, and no other complaints at this time.   Objective: AAO x3, NAD DP/PT pulses palpable bilaterally, CRT less than 3 seconds Sensation decreased with Simms Weinstein monofilament. Digital deformity present to the right hallux.  Ulceration continues on the plantar aspect of the right hallux there is a superficial abrasion type lesion present on the dorsal aspect of the hallux.  Also a ulceration measuring 1.3 x 1.3 cm and plantar aspect left hallux and is superficial with a granular wound base.  No significant hyperkeratotic tissue is present today and there is no drainage or pus identified bilaterally.  Faint erythema to the right hallux there is no ascending cellulitis.  There is no fluctuation or crepitation bilaterally.  No other open lesions or pre-ulceration identified today. No open lesions or pre-ulcerative lesions.  No pain with calf compression, swelling, warmth, erythema  Assessment: Bilateral hallux ulcerations with erythema on the right side  Plan: -All treatment options discussed with the patient including all alternatives, risks, complications.  -Continue with the antibiotics and finish the antibiotics I called in for her.  Continue with antibiotic ointment dressing changes daily.  I  dispensed surgical shoes for bilateral feet as well as PEG assist help offload the area. -Monitor for any clinical signs or symptoms of infection and directed to call the office immediately should any occur or go to the ER. -Patient encouraged to call the office with any questions, concerns, change in symptoms.   [name] DPM  *Patient came in the office the next day with her mother on Tuesday, April 16.  She states that her feet are looking much better and the wounds are much improved today.

## 2017-08-04 ENCOUNTER — Encounter: Payer: Self-pay | Admitting: Family Medicine

## 2017-08-04 NOTE — Telephone Encounter (Signed)
-----   Message from Baxter Flattery sent at 08/03/2017 10:33 AM EDT ----- Regarding: Precert [name] , I have her schedule for 4/26 she has Medicaid . Can you get pre-authorization?

## 2017-08-04 NOTE — Telephone Encounter (Signed)
[facility] - Medicaid notification, THIS [place] MEDICAID MEMBER DOES NOT REQUIRE PRIOR AUTHORIZATION FOR OUTPATIENT RADIOLOGY THROUGH [facility] OR [place] [facility] AT THIS TIME.

## 2017-08-04 NOTE — Telephone Encounter (Signed)
Faxed orders and PA to [facility].

## 2017-08-05 ENCOUNTER — Other Ambulatory Visit: Payer: Self-pay | Admitting: Family Medicine

## 2017-08-08 ENCOUNTER — Ambulatory Visit: Payer: 59 | Admitting: Podiatry

## 2017-08-08 NOTE — Telephone Encounter (Signed)
I informed pt of Dr. Leigh Aurora review of labs.

## 2017-08-08 NOTE — Telephone Encounter (Signed)
-----  Message from [name], DPM sent at 08/02/2017  5:14 PM EDT ----- ESR,glucose is high but other labs normal (she was not fasting). Please let her know!

## 2017-08-10 ENCOUNTER — Ambulatory Visit (INDEPENDENT_AMBULATORY_CARE_PROVIDER_SITE_OTHER): Payer: 59 | Admitting: Podiatry

## 2017-08-10 ENCOUNTER — Encounter: Payer: Self-pay | Admitting: Podiatry

## 2017-08-10 DIAGNOSIS — I83025 Varicose veins of left lower extremity with ulcer other part of foot: Secondary | ICD-10-CM

## 2017-08-10 DIAGNOSIS — L97512 Non-pressure chronic ulcer of other part of right foot with fat layer exposed: Secondary | ICD-10-CM

## 2017-08-10 DIAGNOSIS — L97519 Non-pressure chronic ulcer of other part of right foot with unspecified severity: Secondary | ICD-10-CM

## 2017-08-10 DIAGNOSIS — I83015 Varicose veins of right lower extremity with ulcer other part of foot: Secondary | ICD-10-CM

## 2017-08-10 DIAGNOSIS — L97522 Non-pressure chronic ulcer of other part of left foot with fat layer exposed: Secondary | ICD-10-CM

## 2017-08-10 DIAGNOSIS — L97529 Non-pressure chronic ulcer of other part of left foot with unspecified severity: Secondary | ICD-10-CM

## 2017-08-10 DIAGNOSIS — G609 Hereditary and idiopathic neuropathy, unspecified: Secondary | ICD-10-CM

## 2017-08-10 MED ORDER — SULFAMETHOXAZOLE-TRIMETHOPRIM 800-160 MG PO TABS: 1.0000 | ORAL_TABLET | Freq: Two times a day (BID) | ORAL | 0 refills | Status: DC

## 2017-08-12 ENCOUNTER — Ambulatory Visit (HOSPITAL_COMMUNITY)
Admission: RE | Admit: 2017-08-12 | Discharge: 2017-08-12 | Disposition: A | Discharge status: Home / Self Care | Payer: 59 | Source: Ambulatory Visit | Attending: Internal Medicine | Admitting: Internal Medicine

## 2017-08-12 DIAGNOSIS — L97501 Non-pressure chronic ulcer of other part of unspecified foot limited to breakdown of skin: Secondary | ICD-10-CM | POA: Diagnosis not present

## 2017-08-14 NOTE — Progress Notes (Signed)
   Subjective:  52 year old female presenting today with a chief complaint of ulcerations of bilateral great toes. She states the wounds look better. She has been applying antibiotic ointment for treatment. Patient is here for further evaluation and treatment.   Past Medical History:  Diagnosis Date  . Anxiety   . Asthma   . Bipolar 1 disorder ([place])    ect treatments last treatment Sep 02 1011  . Depression   . GERD (gastroesophageal reflux disease)   . Hypertension   . Seizures ([place])    last seizure was so long ago she can't remember.   . Sleep apnea      Objective/Physical Exam General: The patient is alert and oriented x3 in no acute distress.  Dermatology:  Wound #1 noted to the right great toe measuring 0.6 x 0.7 x 0.1 cm (LxWxD).   Wound #2 noted to the left sub-first MPJ measuring 0.5 x 0.5 x 0.1 cm  To the noted ulceration(s), there is no eschar. There is a moderate amount of slough, fibrin, and necrotic tissue noted. Granulation tissue and wound base is red. There is a minimal amount of serosanguineous drainage noted. There is no exposed bone muscle-tendon ligament or joint. There is no malodor. Periwound integrity is intact. Skin is warm, dry and supple bilateral lower extremities.  Vascular: Palpable pedal pulses bilaterally. Mild edema noted. Capillary refill within normal limits. Varicosities noted bilateral lower extremities.   Neurological: Epicritic and protective threshold diminished bilaterally.   Musculoskeletal Exam: Range of motion within normal limits to all pedal and ankle joints bilateral. Muscle strength 5/5 in all groups bilateral.   Assessment: #1 ulceration of the right great toe secondary to venous insufficiency #2 ulceration of the sub-first MPJ secondary to venous insufficiency  #3 varicosities bilateral lower extremities  Plan of Care:  #1 Patient was evaluated. #2 medically necessary excisional debridement including subcutaneous tissue was  performed using a tissue nipper and a chisel blade. Excisional debridement of all the necrotic nonviable tissue down to healthy bleeding viable tissue was performed with post-debridement measurements same as pre-. #3 the wound was cleansed with normal saline. #4 Continue applying antibiotic ointment daily with a dry sterile dressing #5 Continue wearing post op shoes bilaterally #6 Refill Prescription for Bactrim DS provided to patient.  #7 Return to clinic in 2 weeks with Dr. Jacqualyn Posey.    [name], DPM [facility]  Dr. [name], [place]                                        [place], [place] [address]                Office [phone]  Fax [phone]

## 2017-08-16 ENCOUNTER — Telehealth: Payer: Self-pay | Admitting: *Deleted

## 2017-08-16 ENCOUNTER — Ambulatory Visit: Payer: 59 | Admitting: Physical Therapy

## 2017-08-16 NOTE — Telephone Encounter (Signed)
Left message for pt to call for circulation results.

## 2017-08-16 NOTE — Telephone Encounter (Signed)
-----   Message from [name], DPM sent at 08/16/2017  8:47 AM EDT ----- Please let her know that the circulation test is normal.

## 2017-08-16 NOTE — Telephone Encounter (Signed)
Pt called for results. I informed pt of the results and to continue the therapy Dr. Jacqualyn Posey had prescribed until seen in office again. Pt states understanding.

## 2017-08-23 ENCOUNTER — Ambulatory Visit: Payer: 59 | Admitting: Physical Therapy

## 2017-08-25 ENCOUNTER — Ambulatory Visit (INDEPENDENT_AMBULATORY_CARE_PROVIDER_SITE_OTHER): Payer: 59 | Admitting: Podiatry

## 2017-08-25 ENCOUNTER — Encounter: Payer: Self-pay | Admitting: Podiatry

## 2017-08-25 DIAGNOSIS — L97512 Non-pressure chronic ulcer of other part of right foot with fat layer exposed: Secondary | ICD-10-CM | POA: Diagnosis not present

## 2017-08-25 DIAGNOSIS — L97522 Non-pressure chronic ulcer of other part of left foot with fat layer exposed: Secondary | ICD-10-CM

## 2017-08-29 NOTE — Progress Notes (Signed)
Subjective: Adriahna presents the office today for follow-up evaluation of wounds to both of her feet.  She has been taking Bactrim.  She has been continue with daily dressing changes to both areas with antibiotic ointment and a dry dressing daily.  She denies any drainage or pus.  She states the left side is doing better however the right side is about the same.  She denies any drainage or pus.  She has no other concerns. Denies any systemic complaints such as fevers, chills, nausea, vomiting. No acute changes since last appointment, and no other complaints at this time.   Objective: AAO x3, NAD DP/PT pulses palpable bilaterally, CRT less than 3 seconds On the plantar aspect of the right hallux continues to be ulceration measuring 0.6 x 0.5 x 0.1 cm.  There is edema to the IPJ distally on the hallux but there is no increase in warmth associated erythema.  There is no ascending cellulitis.  There is no fluctuation or crepitation.   On the left foot submetatarsal one continues to be an ulceration which measures 0.4 x 0.4 x 0.1 cm.  Appears to be improved and has a granular wound base.  There is no surrounding erythema, ascending sialitis.  No fluctuation or crepitation however.   Chronic digital deformities are present. No pain with calf compression, swelling, warmth, erythema  Assessment: Chronic ulcerations bilaterally  Plan: -All treatment options discussed with the patient including all alternatives, risks, complications.  -I did sharply debride the wound today without any complications or bleeding with a #312 blade scalpel.  We are switch to daily dressing changes with Medihoney.  Finish course of antibiotics.  Offloading at all times.  We did change out the PegAssist in her surgical shoes today.  -She is awaiting knee surgery once the wound is healed.  Unfortunately wounds are still present. Therefore we will hold off on clearing her for the surgery until this has resolved. -Reviewed the  arterial studies with her.  Arterial duplex revealed normal examination. -Patient encouraged to call the office with any questions, concerns, change in symptoms.   [name] DPM

## 2017-08-31 ENCOUNTER — Ambulatory Visit: Payer: 59 | Admitting: Physical Therapy

## 2017-09-06 ENCOUNTER — Telehealth: Payer: Self-pay

## 2017-09-06 IMAGING — CR DG CERVICAL SPINE 2 OR 3 VIEWS
5 series · 5 of 5 positions shown · non-contrast
Comparison: No recent prior.

CLINICAL DATA: Neck pain.  No injury .

EXAM:
CERVICAL SPINE - 2-3 VIEW

[w cervical spine lat]
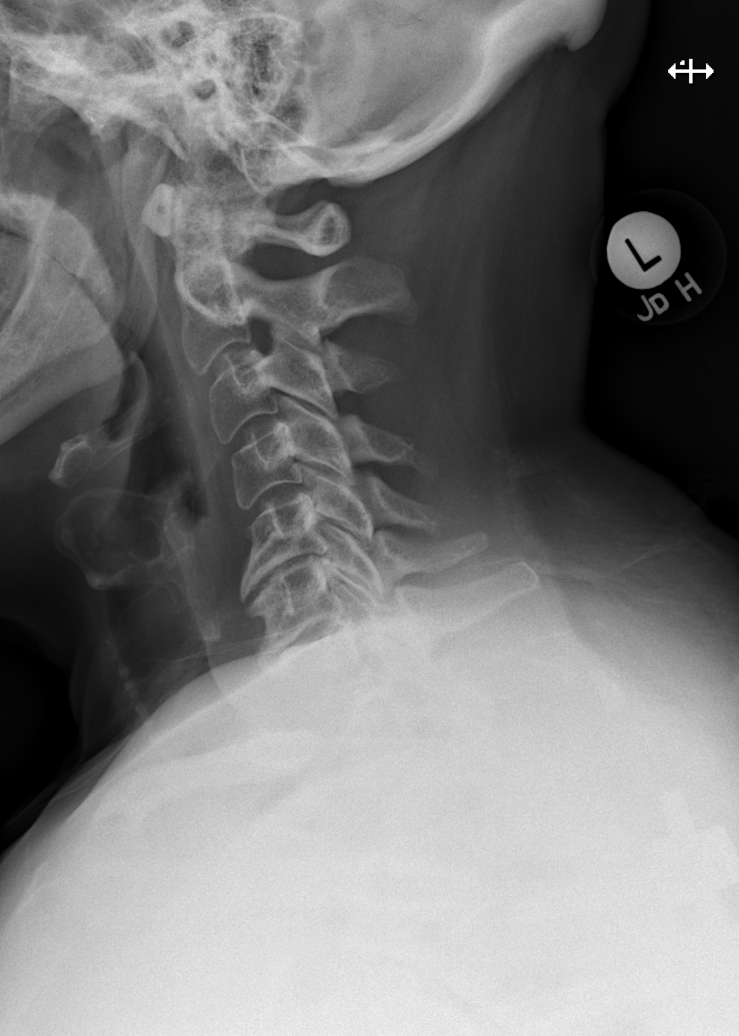

[w cervical swimmers]
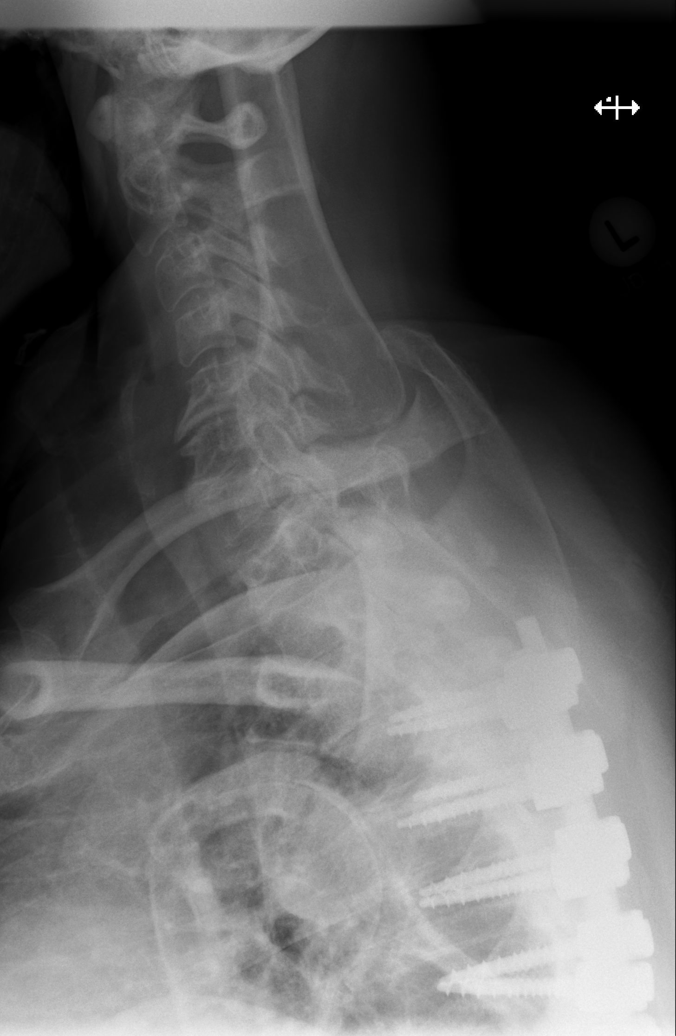

[w cervical spine ap]
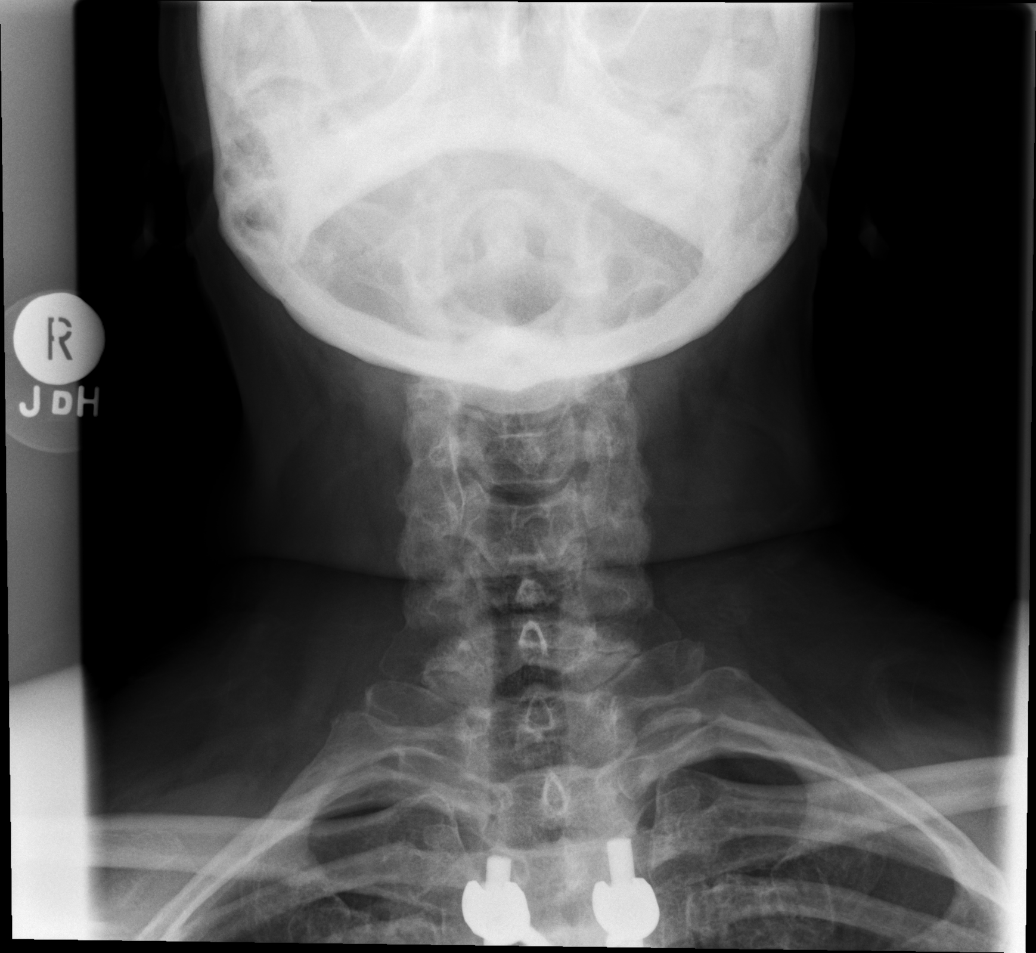

[t cervical spine odontoid (1 of 2)]
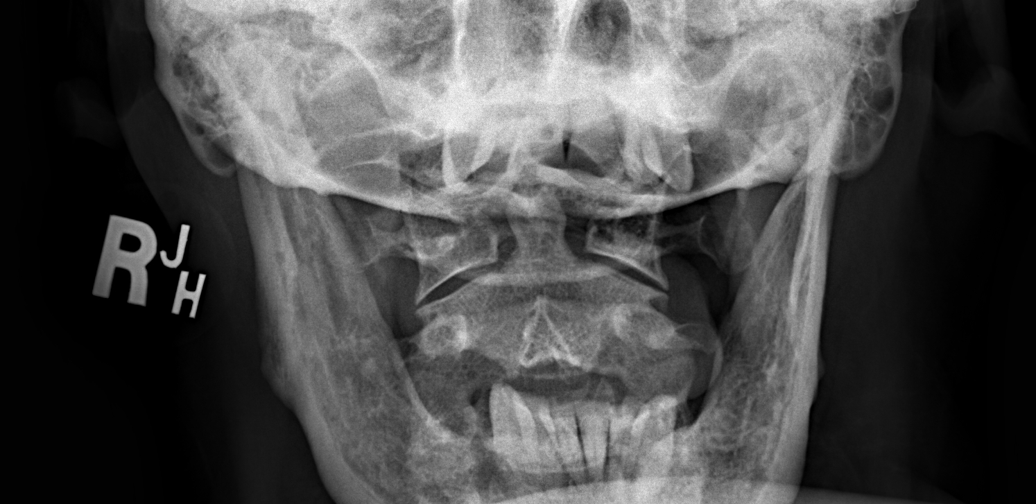

[t cervical spine odontoid (2 of 2)]
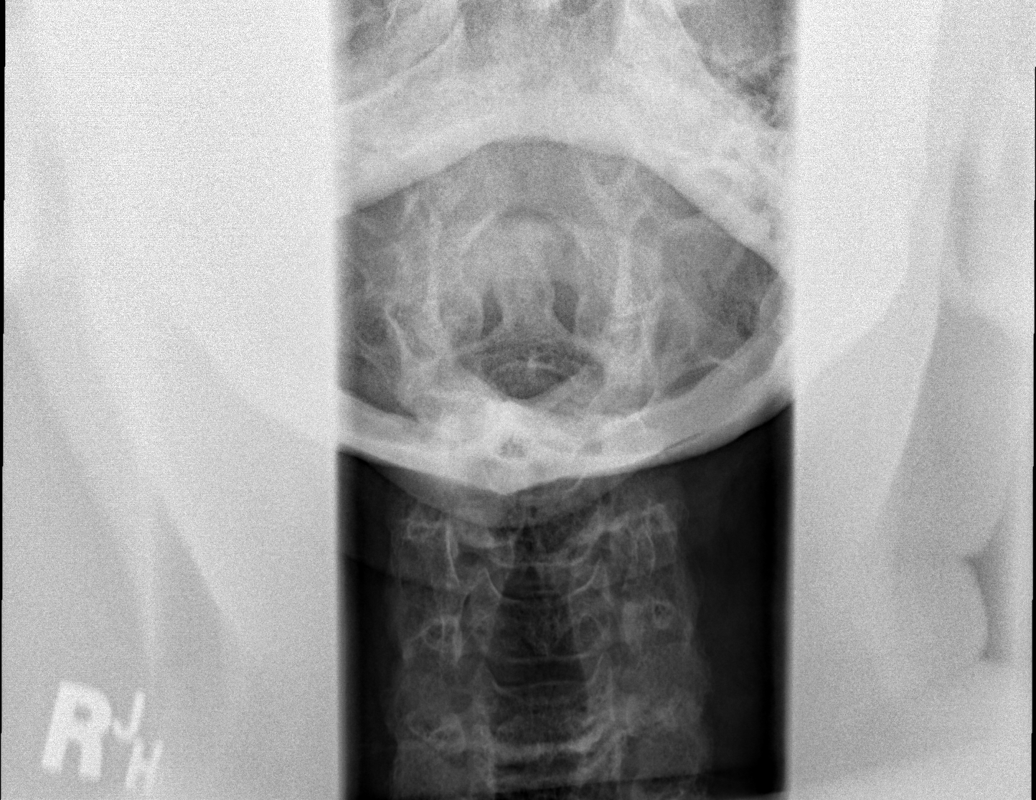

[5 of 5 positions shown; findings below may reference images not displayed]

FINDINGS: Diffuse degenerative change cervical spine with loss of normal
cervical lordosis. Prominent disc space loss and endplate osteophyte
formation noted C5-C6 and C6-C7. C7 is poorly visualized. Prior
thoracic spine fusion. O acute bony abnormality.
IMPRESSION: Diffuse degenerative change cervical spine with particularly
prominent degenerative changes C5-C6 and C6-C7. No acute bony
abnormality .

## 2017-09-06 NOTE — Telephone Encounter (Signed)
Pharmacy requesting PA

## 2017-09-08 ENCOUNTER — Ambulatory Visit (INDEPENDENT_AMBULATORY_CARE_PROVIDER_SITE_OTHER): Payer: 59 | Admitting: Podiatry

## 2017-09-08 ENCOUNTER — Telehealth: Payer: Self-pay

## 2017-09-08 ENCOUNTER — Encounter: Payer: Self-pay | Admitting: Podiatry

## 2017-09-08 DIAGNOSIS — L97512 Non-pressure chronic ulcer of other part of right foot with fat layer exposed: Secondary | ICD-10-CM | POA: Diagnosis not present

## 2017-09-08 DIAGNOSIS — L97522 Non-pressure chronic ulcer of other part of left foot with fat layer exposed: Secondary | ICD-10-CM

## 2017-09-08 NOTE — Telephone Encounter (Signed)
Called patient to advised her that Medicare will not pay for RX. I offered her Pharmacy Savings Card and Coupons to help defray the cost. She asked that I mail it to her. Placed in mail today.

## 2017-09-13 ENCOUNTER — Ambulatory Visit: Payer: 59

## 2017-09-13 NOTE — Progress Notes (Signed)
Subjective: [name] [name] presents the office today for follow-up evaluation of wounds to both of her feet.  She states that she is doing much better than surgical she is been helping quite a bit.  She has been putting meta honey on the wounds daily.  Denies any drainage or pus.  She states that the redness on the right big toe is also greatly improved.  No red streaks.  No swelling.  She has no other concerns today. Denies any systemic complaints such as fevers, chills, nausea, vomiting. No acute changes since last appointment, and no other complaints at this time.   Objective: AAO x3, NAD DP/PT pulses palpable bilaterally, CRT less than 3 seconds On the plantar aspect of the right hallux continues to be ulceration measuring 0.6 x 0.5 however is more superficial and appears to be drying.  There is decrease edema and erythema to the toe and there is no ascending sialitis.  There is no fluctuation or crepitation or any malodor.  On the left foot submetatarsal one continues to be an ulceration which measures 0.3 x 0.3 x 0.1 cm.  Appears to be improved and has a granular wound base.  There is no surrounding erythema, ascending sialitis.  No fluctuation or crepitation however.   Chronic digital deformities are present. No pain with calf compression, swelling, warmth, erythema  Assessment: Chronic ulcerations bilaterally with improvement  Plan: -All treatment options discussed with the patient including all alternatives, risks, complications.  -I did sharply debride the wound today without any complications or bleeding with a #312 blade scalpel.  Continue with meta honey dressing changes daily.  Plan to hold off on further oral antibiotic at this time.  Continue offloading at all times. -Monitor for any clinical signs or symptoms of infection and directed to call the office immediately should any occur or go to the ER. -RTC 2 weeks or sooner if needed  [name] DPM

## 2017-09-15 ENCOUNTER — Other Ambulatory Visit: Payer: Self-pay | Admitting: Podiatry

## 2017-09-22 ENCOUNTER — Ambulatory Visit (INDEPENDENT_AMBULATORY_CARE_PROVIDER_SITE_OTHER): Payer: 59 | Admitting: Podiatry

## 2017-09-22 DIAGNOSIS — L97522 Non-pressure chronic ulcer of other part of left foot with fat layer exposed: Secondary | ICD-10-CM | POA: Diagnosis not present

## 2017-09-22 DIAGNOSIS — L03116 Cellulitis of left lower limb: Secondary | ICD-10-CM

## 2017-09-22 DIAGNOSIS — L97512 Non-pressure chronic ulcer of other part of right foot with fat layer exposed: Secondary | ICD-10-CM | POA: Diagnosis not present

## 2017-09-22 MED ORDER — CEPHALEXIN 500 MG PO CAPS: 500.0000 mg | ORAL_CAPSULE | Freq: Three times a day (TID) | ORAL | 0 refills | Status: DC

## 2017-09-24 ENCOUNTER — Encounter (HOSPITAL_COMMUNITY): Payer: Self-pay | Admitting: Emergency Medicine

## 2017-09-24 ENCOUNTER — Other Ambulatory Visit: Payer: Self-pay

## 2017-09-24 ENCOUNTER — Emergency Department (HOSPITAL_COMMUNITY): Payer: PRIVATE HEALTH INSURANCE

## 2017-09-24 ENCOUNTER — Encounter (HOSPITAL_COMMUNITY): Payer: Self-pay | Admitting: *Deleted

## 2017-09-24 ENCOUNTER — Inpatient Hospital Stay (HOSPITAL_COMMUNITY)
Admission: EM | Admit: 2017-09-24 | Discharge: 2017-09-26 | DRG: 603 | Disposition: A | Discharge status: Home / Self Care | Payer: PRIVATE HEALTH INSURANCE | Attending: Internal Medicine | Admitting: Internal Medicine

## 2017-09-24 ENCOUNTER — Ambulatory Visit (INDEPENDENT_AMBULATORY_CARE_PROVIDER_SITE_OTHER)
Admission: EM | Admit: 2017-09-24 | Discharge: 2017-09-24 | Disposition: A | Discharge status: Home / Self Care | Payer: PRIVATE HEALTH INSURANCE | Source: Home / Self Care

## 2017-09-24 DIAGNOSIS — G894 Chronic pain syndrome: Secondary | ICD-10-CM | POA: Diagnosis present

## 2017-09-24 DIAGNOSIS — J4520 Mild intermittent asthma, uncomplicated: Secondary | ICD-10-CM | POA: Diagnosis not present

## 2017-09-24 DIAGNOSIS — Z79899 Other long term (current) drug therapy: Secondary | ICD-10-CM

## 2017-09-24 DIAGNOSIS — Z880 Allergy status to penicillin: Secondary | ICD-10-CM | POA: Diagnosis not present

## 2017-09-24 DIAGNOSIS — F319 Bipolar disorder, unspecified: Secondary | ICD-10-CM | POA: Diagnosis present

## 2017-09-24 DIAGNOSIS — I10 Essential (primary) hypertension: Secondary | ICD-10-CM | POA: Diagnosis not present

## 2017-09-24 DIAGNOSIS — Z8249 Family history of ischemic heart disease and other diseases of the circulatory system: Secondary | ICD-10-CM | POA: Diagnosis not present

## 2017-09-24 DIAGNOSIS — R569 Unspecified convulsions: Secondary | ICD-10-CM | POA: Diagnosis present

## 2017-09-24 DIAGNOSIS — L97519 Non-pressure chronic ulcer of other part of right foot with unspecified severity: Secondary | ICD-10-CM | POA: Diagnosis not present

## 2017-09-24 DIAGNOSIS — L0390 Cellulitis, unspecified: Secondary | ICD-10-CM | POA: Diagnosis present

## 2017-09-24 DIAGNOSIS — Z888 Allergy status to other drugs, medicaments and biological substances status: Secondary | ICD-10-CM

## 2017-09-24 DIAGNOSIS — G4730 Sleep apnea, unspecified: Secondary | ICD-10-CM | POA: Diagnosis present

## 2017-09-24 DIAGNOSIS — Z885 Allergy status to narcotic agent status: Secondary | ICD-10-CM

## 2017-09-24 DIAGNOSIS — L03116 Cellulitis of left lower limb: Principal | ICD-10-CM | POA: Diagnosis present

## 2017-09-24 DIAGNOSIS — E876 Hypokalemia: Secondary | ICD-10-CM | POA: Diagnosis not present

## 2017-09-24 DIAGNOSIS — K219 Gastro-esophageal reflux disease without esophagitis: Secondary | ICD-10-CM | POA: Diagnosis present

## 2017-09-24 DIAGNOSIS — M7989 Other specified soft tissue disorders: Secondary | ICD-10-CM

## 2017-09-24 DIAGNOSIS — J45909 Unspecified asthma, uncomplicated: Secondary | ICD-10-CM | POA: Diagnosis present

## 2017-09-24 DIAGNOSIS — L97529 Non-pressure chronic ulcer of other part of left foot with unspecified severity: Secondary | ICD-10-CM | POA: Diagnosis not present

## 2017-09-24 HISTORY — DX: Prediabetes: R73.03

## 2017-09-24 LAB — BASIC METABOLIC PANEL
Anion gap: 9 (ref 5–15)
BUN: 9 mg/dL (ref 6–20)
CO2: 27 mmol/L (ref 22–32)
Calcium: 9.5 mg/dL (ref 8.9–10.3)
Chloride: 104 mmol/L (ref 101–111)
Creatinine, Ser: 0.75 mg/dL (ref 0.44–1.00)
GFR calc Af Amer: >60 mL/min (ref >60)
GFR calc non Af Amer: >60 mL/min (ref >60)
Glucose, Bld: 98 mg/dL (ref 65–99)
Potassium: 3.4 mmol/L — ABNORMAL LOW (ref 3.5–5.1)
Sodium: 140 mmol/L (ref 135–145)

## 2017-09-24 LAB — DIFFERENTIAL
Abs Immature Granulocytes: 0 10*3/uL (ref 0.0–0.1)
Basophils Absolute: 0 10*3/uL (ref 0.0–0.1)
Basophils Relative: 0 %
Eosinophils Absolute: 0.1 10*3/uL (ref 0.0–0.7)
Eosinophils Relative: 2 %
Immature Granulocytes: 0 %
Lymphocytes Relative: 23 %
Lymphs Abs: 1.7 10*3/uL (ref 0.7–4.0)
Monocytes Absolute: 0.6 10*3/uL (ref 0.1–1.0)
Monocytes Relative: 8 %
Neutro Abs: 4.9 10*3/uL (ref 1.7–7.7)
Neutrophils Relative %: 67 %

## 2017-09-24 LAB — HEPATIC FUNCTION PANEL
ALT: 17 U/L (ref 14–54)
AST: 17 U/L (ref 15–41)
Albumin: 3.8 g/dL (ref 3.5–5.0)
Alkaline Phosphatase: 89 U/L (ref 38–126)
Bilirubin, Direct: <0.1 mg/dL — ABNORMAL LOW (ref 0.1–0.5)
Total Bilirubin: 0.6 mg/dL (ref 0.3–1.2)
Total Protein: 7.6 g/dL (ref 6.5–8.1)

## 2017-09-24 LAB — CBC
HCT: 39.9 % (ref 36.0–46.0)
Hemoglobin: 12.9 g/dL (ref 12.0–15.0)
MCH: 32.1 pg (ref 26.0–34.0)
MCHC: 32.3 g/dL (ref 30.0–36.0)
MCV: 99.3 fL (ref 78.0–100.0)
Platelets: 265 10*3/uL (ref 150–400)
RBC: 4.02 MIL/uL (ref 3.87–5.11)
RDW: 12.8 % (ref 11.5–15.5)
WBC: 7.2 10*3/uL (ref 4.0–10.5)

## 2017-09-24 LAB — MAGNESIUM: Magnesium: 2.1 mg/dL (ref 1.7–2.4)

## 2017-09-24 LAB — SEDIMENTATION RATE: Sed Rate: 53 mm/hr — ABNORMAL HIGH (ref 0–22)

## 2017-09-24 MED ORDER — GABAPENTIN 600 MG PO TABS
600.0000 mg | ORAL_TABLET | Freq: Three times a day (TID) | ORAL | Status: DC
  Administered 2017-09-24 – 2017-09-26 (×6): 600 mg via ORAL
  Filled 2017-09-24 (×6): qty 1

## 2017-09-24 MED ORDER — POTASSIUM CHLORIDE CRYS ER 20 MEQ PO TBCR
40.0000 meq | EXTENDED_RELEASE_TABLET | Freq: Once | ORAL | Status: AC
  Administered 2017-09-24: 40 meq via ORAL
  Filled 2017-09-24: qty 2

## 2017-09-24 MED ORDER — MORPHINE-NALTREXONE 30-1.2 MG PO CPCR: 1.0000 | ORAL_CAPSULE | Freq: Two times a day (BID) | ORAL | Status: DC

## 2017-09-24 MED ORDER — CLINDAMYCIN PHOSPHATE 600 MG/50ML IV SOLN
600.0000 mg | Freq: Three times a day (TID) | INTRAVENOUS | Status: DC
  Administered 2017-09-25 – 2017-09-26 (×5): 600 mg via INTRAVENOUS
  Filled 2017-09-24 (×7): qty 50

## 2017-09-24 MED ORDER — HALOPERIDOL 5 MG PO TABS
5.0000 mg | ORAL_TABLET | Freq: Every day | ORAL | Status: DC
  Administered 2017-09-25 (×2): 5 mg via ORAL
  Filled 2017-09-24 (×4): qty 1

## 2017-09-24 MED ORDER — ONDANSETRON HCL 4 MG/2ML IJ SOLN: 4.0000 mg | Freq: Four times a day (QID) | INTRAMUSCULAR | Status: DC | PRN

## 2017-09-24 MED ORDER — OXYCODONE HCL 5 MG PO TABS
5.0000 mg | ORAL_TABLET | Freq: Three times a day (TID) | ORAL | Status: DC | PRN
Start: 2017-09-24 — End: 2017-09-26
  Administered 2017-09-24 – 2017-09-26 (×2): 5 mg via ORAL
  Filled 2017-09-24 (×2): qty 1

## 2017-09-24 MED ORDER — OXYCODONE-ACETAMINOPHEN 10-325 MG PO TABS: 1.0000 | ORAL_TABLET | Freq: Three times a day (TID) | ORAL | Status: DC | PRN

## 2017-09-24 MED ORDER — PANTOPRAZOLE SODIUM 40 MG PO TBEC
40.0000 mg | DELAYED_RELEASE_TABLET | Freq: Two times a day (BID) | ORAL | Status: DC
  Administered 2017-09-24 – 2017-09-26 (×4): 40 mg via ORAL
  Filled 2017-09-24 (×4): qty 1

## 2017-09-24 MED ORDER — CITALOPRAM HYDROBROMIDE 20 MG PO TABS
40.0000 mg | ORAL_TABLET | Freq: Every day | ORAL | Status: DC
  Administered 2017-09-24 – 2017-09-25 (×2): 40 mg via ORAL
  Filled 2017-09-24 (×2): qty 2

## 2017-09-24 MED ORDER — METOPROLOL SUCCINATE 12.5 MG HALF TABLET
12.5000 mg | ORAL_TABLET | Freq: Every day | ORAL | Status: DC
  Administered 2017-09-25 – 2017-09-26 (×2): 12.5 mg via ORAL
  Filled 2017-09-24 (×2): qty 1

## 2017-09-24 MED ORDER — TRAZODONE HCL 100 MG PO TABS
100.0000 mg | ORAL_TABLET | Freq: Every day | ORAL | Status: DC
  Administered 2017-09-24 – 2017-09-25 (×2): 100 mg via ORAL
  Filled 2017-09-24 (×2): qty 1

## 2017-09-24 MED ORDER — LINACLOTIDE 145 MCG PO CAPS
290.0000 ug | ORAL_CAPSULE | Freq: Every day | ORAL | Status: DC
  Administered 2017-09-25 – 2017-09-26 (×2): 290 ug via ORAL
  Filled 2017-09-24 (×2): qty 2

## 2017-09-24 MED ORDER — LORATADINE 10 MG PO TABS
5.0000 mg | ORAL_TABLET | Freq: Every evening | ORAL | Status: DC
Start: 2017-09-24 — End: 2017-09-26
  Administered 2017-09-24 – 2017-09-25 (×2): 5 mg via ORAL
  Filled 2017-09-24 (×2): qty 1

## 2017-09-24 MED ORDER — BUSPIRONE HCL 10 MG PO TABS
10.0000 mg | ORAL_TABLET | Freq: Three times a day (TID) | ORAL | Status: DC
  Administered 2017-09-24 – 2017-09-26 (×6): 10 mg via ORAL
  Filled 2017-09-24 (×7): qty 1

## 2017-09-24 MED ORDER — POTASSIUM CHLORIDE CRYS ER 20 MEQ PO TBCR
40.0000 meq | EXTENDED_RELEASE_TABLET | Freq: Every day | ORAL | Status: DC
  Administered 2017-09-25 – 2017-09-26 (×2): 40 meq via ORAL
  Filled 2017-09-24 (×2): qty 2

## 2017-09-24 MED ORDER — TOPIRAMATE 25 MG PO TABS
50.0000 mg | ORAL_TABLET | Freq: Two times a day (BID) | ORAL | Status: DC
  Administered 2017-09-24 – 2017-09-26 (×4): 50 mg via ORAL
  Filled 2017-09-24 (×5): qty 2

## 2017-09-24 MED ORDER — OXYCODONE-ACETAMINOPHEN 5-325 MG PO TABS
1.0000 | ORAL_TABLET | Freq: Three times a day (TID) | ORAL | Status: DC | PRN
Start: 2017-09-24 — End: 2017-09-26
  Administered 2017-09-24 – 2017-09-25 (×3): 1 via ORAL
  Filled 2017-09-24 (×3): qty 1

## 2017-09-24 MED ORDER — ALBUTEROL SULFATE (2.5 MG/3ML) 0.083% IN NEBU: 2.5000 mg | INHALATION_SOLUTION | RESPIRATORY_TRACT | Status: DC | PRN

## 2017-09-24 MED ORDER — ENOXAPARIN SODIUM 40 MG/0.4ML ~~LOC~~ SOLN
40.0000 mg | SUBCUTANEOUS | Status: DC
  Administered 2017-09-24 – 2017-09-25 (×2): 40 mg via SUBCUTANEOUS
  Filled 2017-09-24 (×3): qty 0.4

## 2017-09-24 MED ORDER — POLYETHYLENE GLYCOL 3350 17 G PO PACK: 17.0000 g | PACK | Freq: Every day | ORAL | Status: DC | PRN

## 2017-09-24 MED ORDER — SODIUM CHLORIDE 0.9 % IV BOLUS
500.0000 mL | Freq: Once | INTRAVENOUS | Status: AC
  Administered 2017-09-24: 500 mL via INTRAVENOUS

## 2017-09-24 MED ORDER — CLINDAMYCIN PHOSPHATE 600 MG/50ML IV SOLN
600.0000 mg | Freq: Once | INTRAVENOUS | Status: AC
  Administered 2017-09-24: 600 mg via INTRAVENOUS
  Filled 2017-09-24: qty 50

## 2017-09-24 MED ORDER — ONDANSETRON HCL 4 MG PO TABS: 4.0000 mg | ORAL_TABLET | Freq: Four times a day (QID) | ORAL | Status: DC | PRN

## 2017-09-24 NOTE — ED Notes (Signed)
Provider at bedside

## 2017-09-24 NOTE — H&P (Signed)
[name] [name]:[id number] DOB: 05-03-1965 DOA: 09/24/2017     PCP: [name], MD   Outpatient Specialists:  [facility]  Patient arrived to ER on 09/24/17 at 1343  Patient coming from:    home Lives  With family    Chief Complaint:  Chief Complaint  Patient presents with  . Leg Pain    HPI: [name] [name] is a 52 y.o. female with medical history significant of ulcerations of bilateral great toes, neuropathy, asthma, bipolar disorder, depression, GERD, hypertension, seizures, sleep apnea   Presented with small circular red subcutaneous nodule what has increased the size of her past 2 days affecting left lower extremity associated fevers she has been taking Keflex 3 days but does not seem to be improving.she has processed father and maybe bug bite. She has been seen for this at urgent care center emergency department.  Patient reports chills no fever, diffuse body aches are chronic from severe arthritis Of note she also noted similar bump on her right index finger as well And has chronic bilateral feet ulcers on the right great 2 on left sub-first met M PG joint she's been followed for this by podiatry. She has been treated with Bactrim daily dressing changes. 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  While in ER: Patient was started on clindamycin  Following Medications were ordered in ER: Medications  sodium chloride 0.9 % bolus 500 mL (500 mLs Intravenous New Bag/Given 09/24/17 1927)  clindamycin (CLEOCIN) IVPB 600 mg (0 mg Intravenous Stopped 09/24/17 1751)    Significant initial  Findings: Abnormal Labs Reviewed  BASIC METABOLIC PANEL - Abnormal; Notable for the following components:      Result Value   Potassium 3.4 (*)    All other components within normal limits       Na 140 K 3.4  Cr  stable,   Lab Results  Component Value Date   CREATININE 0.75 09/24/2017   CREATININE 0.82 08/01/2017   CREATININE 0.56 05/05/2017      WBC   7.2  HG/HCT  stable,      Component Value Date/Time   HGB 12.9 09/24/2017 1427   HCT 39.9 09/24/2017 1427    Troponin (Point of Care Test) No results for input(s): TROPIPOC in the last 72 hours.      UA  not ordered  plain Imaging showing no evidence of gas osteomyelitis    ECG:  Not obtained       ED Triage Vitals  Enc Vitals Group     BP 09/24/17 1359 113/82     Pulse Rate 09/24/17 1359 72     Resp 09/24/17 1359 16     Temp 09/24/17 1359 98 F (36.7 C)     Temp Source 09/24/17 1359 Oral     SpO2 09/24/17 1359 97 %     Weight --      Height --      Head Circumference --      Peak Flow --      Pain Score 09/24/17 1400 6     Pain Loc --      Pain Edu? --      Excl. in [place]? --   TMAX(24)@       Latest  Blood pressure 112/75, pulse 66, temperature  46 F (36.7 C), temperature source Oral, resp. rate 16, SpO2 98 %.   Hospitalist was called for admission for possible cellulitis   Review of Systems:    Pertinent positives include: leg pain  Constitutional:  No weight loss, night sweats, Fevers, chills, fatigue, weight loss  HEENT:  No headaches, Difficulty swallowing,Tooth/dental problems,Sore throat,  No sneezing, itching, ear ache, nasal congestion, post nasal drip,  Cardio-vascular:  No chest pain, Orthopnea, PND, anasarca, dizziness, palpitations.no Bilateral lower extremity swelling  GI:  No heartburn, indigestion, abdominal pain, nausea, vomiting, diarrhea, change in bowel habits, loss of appetite, melena, blood in stool, hematemesis Resp:  no shortness of breath at rest. No dyspnea on exertion, No excess mucus, no productive cough, No non-productive cough, No coughing up of blood.No change in color of mucus.No wheezing. Skin:  no rash or lesions. No jaundice GU:  no  dysuria, change in color of urine, no urgency or frequency. No straining to urinate.  No flank pain.  Musculoskeletal:  No joint pain or no joint swelling. No decreased range of motion. No back pain.  Psych:  No change in mood or affect. No depression or anxiety. No memory loss.  Neuro: no localizing neurological complaints, no tingling, no weakness, no double vision, no gait abnormality, no slurred speech, no confusion  As per HPI otherwise 10 point review of systems negative.   Past Medical History:   Past Medical History:  Diagnosis Date  . Anxiety   . Asthma   . Bipolar 1 disorder ([place])    ect treatments last treatment Sep 02 1011  . Depression   . GERD (gastroesophageal reflux disease)   . Hypertension   . Seizures ([place])    last seizure was so long ago she can't remember.   . Sleep apnea       Past Surgical History:  Procedure Laterality Date  . ABDOMINAL HYSTERECTOMY    . BACK SURGERY    . CARPAL TUNNEL RELEASE     x2  . Laproscopic knee surgery    . NECK SURGERY  03/15/2017  . PLANTAR FASCIA RELEASE     x2    Social History:  Ambulatory   [name]     reports that she has never smoked. She has never used smokeless tobacco. She reports that she does not drink alcohol or use drugs.     Family History:   Family History  Problem Relation Age of Onset  . Diabetes Mother   . COPD Mother   . Hypertension Mother   . Hyperlipidemia Mother   . Heart disease Father   . Hyperlipidemia Father   . Hypertension Father   . Cancer Father   . Heart disease Brother   . Heart disease Daughter   . Heart disease Maternal Grandmother   . Hypertension Maternal Grandmother   . Heart disease Maternal Grandfather   . Kidney cancer Paternal Grandmother   . Heart disease Paternal Grandfather     Allergies: Allergies  Allergen Reactions  . Tetracyclines & Related Other (See Comments)    syncope  . Tramadol Other (See Comments)    seizures  . Ciprofloxacin Rash  .  Codeine Itching  . Penicillins Hives and Rash    Has patient had a PCN reaction causing immediate rash, facial/tongue/throat swelling, SOB or lightheadedness with hypotension: No Has patient had a PCN reaction causing severe rash involving mucus membranes or skin necrosis: No Has patient had a PCN reaction that required hospitalization: No Has patient had  a PCN reaction occurring within the last 10 years: No If all of the above answers are "NO", then may proceed with Cephalosporin use.     Prior to Admission medications   Medication Sig Start Date End Date Taking? Authorizing Provider  Artificial Tear Ointment (DRY EYES OP) Place 1 drop into both eyes daily as needed.    [provider]  busPIRone (BUSPAR) 10 MG tablet Take 1 tablet by mouth three times a day For anxiety 04/26/17   [provider]  Calcium Carbonate-Vit D-Min (CALCIUM 1200 PO) Take 1 tablet by mouth daily.    [provider]  cephALEXin (KEFLEX) 500 MG capsule Take 1 capsule (500 mg total) by mouth 3 (three) times daily. 09/22/17   [name], DPM  citalopram (CELEXA) 40 MG tablet Take 40 mg by mouth at bedtime.  05/28/11   Darrol Jump, MD  clotrimazole (LOTRIMIN) 1 % cream Apply 1 application topically 2 (two) times daily. 04/21/17   Shelly Bombard, MD  cyclobenzaprine (FLEXERIL) 10 MG tablet Take 10 mg by mouth 2 (two) times daily.     [provider]  EMBEDA 30-1.2 MG CPCR Take 1 capsule by mouth 2 (two) times daily.  10/19/16   [provider]  fluticasone (FLONASE) 50 MCG/ACT nasal spray Place 2 sprays into both nostrils daily. Patient taking differently: Place 2 sprays into both nostrils daily as needed for allergies.  04/05/16   [name], MD  gabapentin (NEURONTIN) 600 MG tablet Take 1 tablet (600 mg total) by mouth 3 (three) times daily. For anxiety and pain management. May have to stop to take ECT. 05/28/11   Darrol Jump, MD  haloperidol (HALDOL) 5 MG tablet  Take 1 tablet (5 mg total) by mouth at bedtime. For psychosis 11/15/15   Barton Dubois, MD  hydrochlorothiazide (HYDRODIURIL) 25 MG tablet TAKE ONE TABLET BY MOUTH DAILY 01/11/17   [name], MD  levocetirizine (XYZAL) 5 MG tablet Take 1 tablet (5 mg total) by mouth every evening. 06/20/17   [name], MD  LINZESS 290 MCG CAPS capsule Take 1 capsule (290 mcg total) by mouth daily before breakfast. 01/11/17   [name], MD  magic mouthwash SOLN Hydrocortisone 774m, Nystatin suspension 31m diphenhydramine 12.74m44mS 240m3m tsp tid swish and spit. 07/21/17   WagoTrula SladeM  metoprolol succinate (TOPROL XL) 25 MG 24 hr tablet Take 0.5 tablets (12.5 mg total) by mouth daily. 03/30/17   Barrett, RhonEvelene Croon-C  Multiple Vitamins-Minerals (MULTIVITAMIN WITH MINERALS) tablet Take 1 tablet by mouth daily.    [provider]  neomycin-polymyxin-hydrocortisone (CORTISPORIN) 3.5-10000-1 OTIC suspension Place 4 drops into both ears 3 (three) times daily. 04/15/17   Blue, Olivia C, PA-C  oxyCODONE-acetaminophen (PERCOCET) 10-325 MG tablet Take 1 tablet by mouth 3 (three) times daily. 12/14/16   [provider]  pantoprazole (PROTONIX) 40 MG tablet TAKE ONE TABLET BY MOUTH TWICE DAILY 08/08/17   PickSusy Frizzle  potassium chloride SA (KLOR-CON M20) 20 MEQ tablet Take 2 tablets (40 mEq total) by mouth daily. 11/02/16   PickSusy Frizzle  PROAIR HFA 108 ([id number]) MCG/ACT inhaler INHALE TWO puffs BY MOUTH EVERY 4 HOURS AS NEEDED FOR WHEEZING 07/08/17   PickSusy Frizzle  promethazine (PHENERGAN) 25 MG tablet Take 1 tablet (25 mg total) by mouth every 6 (six) hours as needed for nausea or vomiting. 06/26/17   MurrWynona Luna  topiramate (TOPAMAX) 50 MG tablet TAKE ONE TABLET BY MOUTH TWICE DAILY 01/11/17   [name], MD  traZODone (DESYREL) 100 MG tablet Take 100 mg by mouth at bedtime. 07/29/14   [provider]   Physical Exam: Blood  pressure 112/75, pulse 66, temperature 98 F (36.7 C), temperature source Oral, resp. rate 16, SpO2 98 %. 1. General:  in No Acute distress  Chronically ill   -appearing 2. Psychological: Alert and   Oriented 3. Head/ENT:     Dry Mucous Membranes                          Head Non traumatic, neck supple                        Poor Dentition 4. SKIN:  decreased Skin turgor,  Skin clean Dry  Bilateral foot ulcers, erythema circular pattern of left lower ext.    Decreased capillary refill.              5. Heart: Regular rate and rhythm no  Murmur, no Rub or gallop 6. Lungs: Clear to auscultation bilaterally, no wheezes or crackles   7. Abdomen: Soft,  non-tender, Non distended  Obese bowel sounds present 8. Lower extremities: no clubbing, cyanosis, or edema 9. Neurologically Grossly intact, moving all 4 extremities equally  10. MSK: Normal range of motion   LABS:     Recent Labs  Lab 09/24/17 1427  WBC 7.2  HGB 12.9  HCT 39.9  MCV 99.3  PLT 509   Basic Metabolic Panel: Recent Labs  Lab 09/24/17 1427  NA 140  K 3.4*  CL 104  CO2 27  GLUCOSE 98  BUN 9  CREATININE 0.75  CALCIUM 9.5      No results for input(s): AST, ALT, ALKPHOS, BILITOT, PROT, ALBUMIN in the last 168 hours. No results for input(s): LIPASE, AMYLASE in the last 168 hours. No results for input(s): AMMONIA in the last 168 hours.    HbA1C: No results for input(s): HGBA1C in the last 72 hours. CBG: No results for input(s): GLUCAP in the last 168 hours.    Urine analysis:    Component Value Date/Time   COLORURINE YELLOW 07/05/2017 1528   APPEARANCEUR CLEAR 07/05/2017 1528   LABSPEC 1.015 07/05/2017 1528   PHURINE 5.5 07/05/2017 1528   GLUCOSEU NEGATIVE 07/05/2017 1528   HGBUR TRACE (A) 07/05/2017 1528   BILIRUBINUR NEGATIVE 06/23/2017 1108   KETONESUR NEGATIVE 07/05/2017 1528   PROTEINUR NEGATIVE 07/05/2017 1528   UROBILINOGEN 0.2 06/23/2017 1108   NITRITE NEGATIVE 07/05/2017 1528    LEUKOCYTESUR NEGATIVE 07/05/2017 1528       Cultures:    Component Value Date/Time   SDES URINE, RANDOM 06/23/2017 1112   SPECREQUEST  06/23/2017 1112    NONE Performed at [facility], [place] [address], [place],  [address]    CULT >=100,000 COLONIES/mL ESCHERICHIA COLI (A) 06/23/2017 1112   REPTSTATUS 06/25/2017 FINAL 06/23/2017 1112     Radiological Exams on Admission: Dg Tibia/fibula Left  Result Date: 09/24/2017 CLINICAL DATA:  Ulceration medially in the distal lower leg. History of skin ulcers and pre diabetes. History of neuropathy. EXAM: LEFT TIBIA AND FIBULA - 2 VIEW COMPARISON:  None. FINDINGS: The mineralization and alignment are normal. There is no evidence of acute fracture or dislocation. There is no evidence of bone destruction. Mild spurring at the quadriceps insertion on the patella  is noted. There is some edema medially in the distal lower leg, but no evidence of soft tissue emphysema or foreign body. IMPRESSION: No acute osseous findings.  Medial soft tissue swelling distally. Electronically Signed   By: Richardean Sale M.D.   On: 09/24/2017 16:25   Dg Foot 2 Views Left  Result Date: 09/24/2017 CLINICAL DATA:  History of foot ulcers. Current foot ulcer over the plantar aspect of the foot near the head of the 1st metatarsal. History of pre diabetes and neuropathy. EXAM: LEFT FOOT - 2 VIEW COMPARISON:  Left ankle MRI 04/14/2010. FINDINGS: The mineralization and alignment are normal. There is no evidence of acute fracture, dislocation or bone destruction. Patient is status post cannulated screw arthrodesis of the interphalangeal joint of the great toe. There are mild degenerative changes at the 1st metatarsophalangeal joint. Possible fusion across the 3rd and 4th proximal interphalangeal joints. Mild midfoot degenerative changes and small posterior calcaneal spur. No soft tissue emphysema or other foreign body seen. IMPRESSION: No radiographic evidence of  osteomyelitis. Degenerative and postsurgical changes as described Electronically Signed   By: Richardean Sale M.D.   On: 09/24/2017 16:22    Chart has been reviewed    Assessment/Plan  52 y.o. female with medical history significant of ulcerations of bilateral great toes, neuropathy, asthma, bipolar disorder, depression, GERD, hypertension, seizures, sleep apnea  Admitted for possible cellulitis of left lower extremity  Present on Admission: . Cellulitis - -admit per cellulitis protocol will      Given somewhat atypical presentation at this improved with antibiotics probably will need to further workup Patient denies any history of tick bites or insect bites. History of lower extremity ulcerations and presence of father lesions also could indicate vasculopathy       continue current antibiotic choice      plain films showed:   no evidence of air  no evidence of osteomyelitis        Will obtain MRSA screening,       btain blood cultures       further antibiotic adjustment pending above results  . Asthma - currently stablealbuterol when necessary . Chronic pain syndrome restart home medications . Essential hypertension stable continue home medications . Hypokalemia - replace check magnesium level      Other plan as per orders.  DVT prophylaxis:    Lovenox     Code Status:  FULL CODE as per patient   I had personally discussed CODE STATUS with patient  Family Communication:   Family not  at  Bedside    Disposition Plan:       To home once workup is complete and patient is stable                            Consults called: none  Admission status:  Inpatient    Level of care         medical floor        Toy Baker 09/24/2017, 8:37 PM    [facility]  Pager [phone]   after 2 AM please page floor coverage PA If 7AM-7PM, please contact the day team taking care of the patient  [email or web address]  Password [email or web address]

## 2017-09-24 NOTE — ED Triage Notes (Addendum)
Reports noticing red lesion to left medial distal lower leg 2 days ago with gradual worsening, tenderness.  Denies fevers.  Pt has been taking cephalexin for left foot infection; states it seems to be getting worse.

## 2017-09-24 NOTE — ED Triage Notes (Signed)
Pt presents to ED for assessment of left foot ulcers as well as a reddened area to the shin of the left leg.  Seen at [facility] and sent here for r/o cellulitis as well as blood clots.

## 2017-09-24 NOTE — Discharge Instructions (Addendum)
Please go to [facility] emergency room for ultrasound to rule out of blood clot and treatment of cellulitis.

## 2017-09-24 NOTE — ED Provider Notes (Signed)
[facility] EMERGENCY DEPARTMENT Provider Note   CSN: [id number] Arrival date & time: 09/24/17  1343     History   Chief Complaint Chief Complaint  Patient presents with  . Leg Pain    HPI [name] [name] is a 52 y.o. female.  HPI  52 year old female history of bipolar disorder, anxiety, asthma, recently seen for left foot ulcer presents today after evaluation at urgent care.  She has noted some redness of her left lower extremity occipital to the ulcer.  She states she was seen by podiatrist on Thursday.  She was started on Keflex.  She has continued to have some redness that has spread from the site of the ulcer.  She has now noted some redness spreading up her leg.  She noted this this morning.  She has had some chills but no fever.  She denies any pain states she has a peripheral neuropathy from low back problems.  She was seen in urgent care and sent to emergency department for further evaluation.  Past Medical History:  Diagnosis Date  . Anxiety   . Asthma   . Bipolar 1 disorder ([place])    ect treatments last treatment Sep 02 1011  . Depression   . GERD (gastroesophageal reflux disease)   . Hypertension   . Seizures ([place])    last seizure was so long ago she can't remember.   . Sleep apnea     Patient Active Problem List   Diagnosis Date Noted  . Syncope and collapse   . Near syncope 11/23/2016  . Abdominal pain 09/23/2016  . Anxiety 09/23/2016  . 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[place]) 09/23/2016  . Constipation 09/23/2016  . DDD (degenerative disc disease), lumbosacral 09/23/2016  . Hypertension 09/23/2016  . Migraines 09/23/2016  . Myelopathy ([place]) 09/23/2016  . Nausea 09/23/2016  . Post laminectomy syndrome 09/23/2016  . Pseudoarthrosis of lumbar spine 09/23/2016  . Seizures ([place]) 09/23/2016  . Shortness of breath 09/23/2016  . Sleep apnea 09/23/2016  . Spinal stenosis of lumbar region 09/23/2016  . Vertigo 09/23/2016  . Hallux malleus  11/15/2015  . Chronic pain syndrome   . Esophageal reflux   . Depression   . Rash 11/14/2015  . Cellulitis of great toe of right foot 08/12/2015  . Toe ulcer, right ([place]) 04/11/2015  . Cellulitis of toe of right foot 04/11/2015  . Nonspecific chest pain   . Chest pain 10/26/2014  . Obesity 10/26/2014  . Hypokalemia 10/26/2014  . Manic bipolar I disorder ([place]) 10/26/2014  . Atypical angina ([place]) 10/26/2014  . Tardive dyskinesia 08/26/2011  . Manic bipolar I disorder with rapid cycling ([place]) 05/18/2011    Class: Acute  . Asthma 02/24/2011  . Chronic pain 02/24/2011  . Essential hypertension 02/24/2011  . History of migraine headaches 02/24/2011    Past Surgical History:  Procedure Laterality Date  . ABDOMINAL HYSTERECTOMY    . BACK SURGERY    . CARPAL TUNNEL RELEASE     x2  . Laproscopic knee surgery    . NECK SURGERY  03/15/2017  . PLANTAR FASCIA RELEASE     x2     OB History    Gravida  3   Para  3   Term  3   Preterm      AB      Living  3     SAB      TAB      Ectopic      Multiple  Live Births  3            Home Medications    Prior to Admission medications   Medication Sig Start Date End Date Taking? Authorizing Provider  Artificial Tear Ointment (DRY EYES OP) Place 1 drop into both eyes daily as needed.    [provider]  busPIRone (BUSPAR) 10 MG tablet Take 1 tablet by mouth three times a day For anxiety 04/26/17   [provider]  Calcium Carbonate-Vit D-Min (CALCIUM 1200 PO) Take 1 tablet by mouth daily.    [provider]  cephALEXin (KEFLEX) 500 MG capsule Take 1 capsule (500 mg total) by mouth 3 (three) times daily. 09/22/17   [name], DPM  citalopram (CELEXA) 40 MG tablet Take 40 mg by mouth at bedtime.  05/28/11   Darrol Jump, MD  clotrimazole (LOTRIMIN) 1 % cream Apply 1 application topically 2 (two) times daily. 04/21/17   Shelly Bombard, MD  cyclobenzaprine (FLEXERIL) 10 MG tablet Take  10 mg by mouth 2 (two) times daily.     [provider]  EMBEDA 30-1.2 MG CPCR Take 1 capsule by mouth 2 (two) times daily.  10/19/16   [provider]  fluticasone (FLONASE) 50 MCG/ACT nasal spray Place 2 sprays into both nostrils daily. Patient taking differently: Place 2 sprays into both nostrils daily as needed for allergies.  04/05/16   [name], MD  gabapentin (NEURONTIN) 600 MG tablet Take 1 tablet (600 mg total) by mouth 3 (three) times daily. For anxiety and pain management. May have to stop to take ECT. 05/28/11   Darrol Jump, MD  haloperidol (HALDOL) 5 MG tablet Take 1 tablet (5 mg total) by mouth at bedtime. For psychosis 11/15/15   Barton Dubois, MD  hydrochlorothiazide (HYDRODIURIL) 25 MG tablet TAKE ONE TABLET BY MOUTH DAILY 01/11/17   [name], MD  levocetirizine (XYZAL) 5 MG tablet Take 1 tablet (5 mg total) by mouth every evening. 06/20/17   [name], MD  LINZESS 290 MCG CAPS capsule Take 1 capsule (290 mcg total) by mouth daily before breakfast. 01/11/17   [name], MD  magic mouthwash SOLN Hydrocortisone 60mg , Nystatin suspension 40ml, diphenhydramine 12.5mg  QS 23ml, 1 tsp tid swish and spit. 07/21/17   [name], DPM  metoprolol succinate (TOPROL XL) 25 MG 24 hr tablet Take 0.5 tablets (12.5 mg total) by mouth daily. 03/30/17   Barrett, Evelene Croon, PA-C  Multiple Vitamins-Minerals (MULTIVITAMIN WITH MINERALS) tablet Take 1 tablet by mouth daily.    [provider]  neomycin-polymyxin-hydrocortisone (CORTISPORIN) 3.5-10000-1 OTIC suspension Place 4 drops into both ears 3 (three) times daily. 04/15/17   Blue, Olivia C, PA-C  oxyCODONE-acetaminophen (PERCOCET) 10-325 MG tablet Take 1 tablet by mouth 3 (three) times daily. 12/14/16   [provider]  pantoprazole (PROTONIX) 40 MG tablet TAKE ONE TABLET BY MOUTH TWICE DAILY 08/08/17   [name], MD  potassium chloride SA (KLOR-CON M20) 20 MEQ tablet Take 2  tablets (40 mEq total) by mouth daily. 11/02/16   [name], MD  PROAIR HFA [phone] Base) MCG/ACT inhaler INHALE TWO puffs BY MOUTH EVERY 4 HOURS AS NEEDED FOR WHEEZING 07/08/17   [name], MD  promethazine (PHENERGAN) 25 MG tablet Take 1 tablet (25 mg total) by mouth every 6 (six) hours as needed for nausea or vomiting. 06/26/17   Wynona Luna, MD  topiramate (TOPAMAX) 50 MG tablet TAKE ONE  TABLET BY MOUTH TWICE DAILY 01/11/17   [name], MD  traZODone (DESYREL) 100 MG tablet Take 100 mg by mouth at bedtime. 07/29/14   [provider]    Family History Family History  Problem Relation Age of Onset  . Diabetes Mother   . COPD Mother   . Hypertension Mother   . Hyperlipidemia Mother   . Heart disease Father   . Hyperlipidemia Father   . Hypertension Father   . Cancer Father   . Heart disease Brother   . Heart disease Daughter   . Heart disease Maternal Grandmother   . Hypertension Maternal Grandmother   . Heart disease Maternal Grandfather   . Kidney cancer Paternal Grandmother   . Heart disease Paternal Grandfather     Social History Social History   Tobacco Use  . Smoking status: Never Smoker  . Smokeless tobacco: Never Used  Substance Use Topics  . Alcohol use: No  . Drug use: No     Allergies   Tetracyclines & related; Tramadol; Ciprofloxacin; Codeine; and Penicillins   Review of Systems Review of Systems  Constitutional: Positive for chills.  HENT: Negative.   Eyes: Negative.   Respiratory: Negative.   Cardiovascular: Negative.   Gastrointestinal: Negative.   Endocrine: Negative.   Genitourinary: Negative.   Musculoskeletal: Negative.   Skin: Positive for color change, rash and wound.  Allergic/Immunologic: Negative.   Neurological: Negative.   Hematological: Negative.   Psychiatric/Behavioral: Negative.   All other systems reviewed and are negative.    Physical Exam Updated Vital Signs BP 113/82 (BP Location:  Left Arm)   Pulse 72   Temp 98 F (36.7 C) (Oral)   Resp 16   SpO2 97%   Physical Exam  Constitutional: She is oriented to person, place, and time. She appears well-developed and well-nourished.  Morbidly obese female who does not appear to be in any acute distress  HENT:  Head: Normocephalic and atraumatic.  Right Ear: External ear normal.  Left Ear: External ear normal.  Mouth/Throat: Oropharynx is clear and moist.  Eyes: Pupils are equal, round, and reactive to light. EOM are normal.  Neck: Normal range of motion. Neck supple.  Cardiovascular: Normal rate and regular rhythm.  Pulmonary/Chest: Effort normal and breath sounds normal.  Abdominal: Soft. Bowel sounds are normal.  Musculoskeletal: Normal range of motion.  Neurological: She is alert and oriented to person, place, and time.  Skin: Skin is warm and dry. Capillary refill takes less than 2 seconds. Rash noted. There is erythema.  Psychiatric: She has a normal mood and affect.  Nursing note and vitals reviewed.    ED Treatments / Results  Labs (all labs ordered are listed, but only abnormal results are displayed) Labs Reviewed  BASIC METABOLIC PANEL - Abnormal; Notable for the following components:      Result Value   Potassium 3.4 (*)    All other components within normal limits  CBC    EKG None  Radiology No results found.  Procedures     Procedures (including critical care time)  Medications Ordered in ED Medications - No data to display   Initial Impression / Assessment and Plan / ED Course  I have reviewed the triage vital signs and the nursing notes.  Pertinent labs & imaging results that were available during my care of the patient were reviewed by me and considered in my medical decision making (see chart for details).     Patient treated here with IV  antibiotics.  However, redness is spreading.  Labs are normal with normal heart rate.  Patient has continued afebrile.  She is received IV  clindamycin. Discussed with Dr.Doutova and she will see for admission  Final Clinical Impressions(s) / ED Diagnoses   Final diagnoses:  Cellulitis of left lower extremity    ED Discharge Orders    None       Pattricia Boss, MD 09/24/17 1947

## 2017-09-24 NOTE — ED Provider Notes (Addendum)
[facility]    CSN: [id number] Arrival date & time: 09/24/17  1204     History   Chief Complaint Chief Complaint  Patient presents with  . Leg Swelling    HPI [name] [name] is a 52 y.o. female.   Chief complaint of leg lesion left lower leg x3 days. Not sure if 'bug bite'. No falls. Describes as worsening.  Tender.  She is been taking Keflex however it seems to be getting worse. No disharge from wound. No n, v.  Seen 09/08/2017 podiatry, [facility] for chronic right toe ulcer, left foot ulcer.  That is post debridement. Seen again on 6/6 and given Keflex.  Notes chronic left toe ulcer, debrided 3 days ago; also has ulcer on right toe which is almost 'healed.'   No h/o DVT. No SOB. Ambulatory. No recent surgeries.   Nonsmoker  No DM. No vascular disease per patient; has seen vascular.  NO h/o mrsa          Past Medical History:  Diagnosis Date  . Anxiety   . Asthma   . Bipolar 1 disorder ([place])    ect treatments last treatment Sep 02 1011  . Depression   . GERD (gastroesophageal reflux disease)   . Hypertension   . Seizures ([place])    last seizure was so long ago she can't remember.   . Sleep apnea     Patient Active Problem List   Diagnosis Date Noted  . Syncope and collapse   . Near syncope 11/23/2016  . Abdominal pain 09/23/2016  . Anxiety 09/23/2016  . 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[place]) 09/23/2016  . Constipation 09/23/2016  . DDD (degenerative disc disease), lumbosacral 09/23/2016  . Hypertension 09/23/2016  . Migraines 09/23/2016  . Myelopathy ([place]) 09/23/2016  . Nausea 09/23/2016  . Post laminectomy syndrome 09/23/2016  . Pseudoarthrosis of lumbar spine 09/23/2016  . Seizures ([place]) 09/23/2016  . Shortness of breath 09/23/2016  . Sleep apnea 09/23/2016  . Spinal stenosis of lumbar region 09/23/2016  . Vertigo 09/23/2016  . 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 Chronic pain syndrome   . Esophageal reflux   . Depression   . Rash  11/14/2015  . Cellulitis of great toe of right foot 08/12/2015  . Toe ulcer, right ([place]) 04/11/2015  . Cellulitis of toe of right foot 04/11/2015  . Nonspecific chest pain   . Chest pain 10/26/2014  . Obesity 10/26/2014  . Hypokalemia 10/26/2014  . Manic bipolar I disorder ([place]) 10/26/2014  . Atypical angina ([place]) 10/26/2014  . Tardive dyskinesia 08/26/2011  . Manic bipolar I disorder with rapid cycling ([place]) 05/18/2011    Class: Acute  . Asthma 02/24/2011  . Chronic pain 02/24/2011  . Essential hypertension 02/24/2011  . History of migraine headaches 02/24/2011    Past Surgical History:  Procedure Laterality Date  . ABDOMINAL HYSTERECTOMY    . BACK SURGERY    . CARPAL TUNNEL RELEASE     x2  . Laproscopic knee surgery    . NECK SURGERY  03/15/2017  . PLANTAR FASCIA RELEASE     x2    OB History    Gravida  3   Para  3   Term  3   Preterm      AB      Living  3     SAB      TAB      Ectopic      Multiple      Live Births  3            Home Medications    Prior to Admission medications   Medication Sig Start Date End Date Taking? Authorizing Provider  Artificial Tear Ointment (DRY EYES OP) Place 1 drop into both eyes daily as needed.   Yes [provider]  busPIRone (BUSPAR) 10 MG tablet Take 1 tablet by mouth three times a day For anxiety 04/26/17  Yes [provider]  Calcium Carbonate-Vit D-Min (CALCIUM 1200 PO) Take 1 tablet by mouth daily.   Yes [provider]  cephALEXin (KEFLEX) 500 MG capsule Take 1 capsule (500 mg total) by mouth 3 (three) times daily. 09/22/17  Yes [name], DPM  citalopram (CELEXA) 40 MG tablet Take 40 mg by mouth at bedtime.  05/28/11  Yes Darrol Jump, MD  clotrimazole (LOTRIMIN) 1 % cream Apply 1 application topically 2 (two) times daily. 04/21/17  Yes Shelly Bombard, MD  cyclobenzaprine (FLEXERIL) 10 MG tablet Take 10 mg by mouth 2 (two) times daily.    Yes [provider]    EMBEDA 30-1.2 MG CPCR Take 1 capsule by mouth 2 (two) times daily.  10/19/16  Yes [provider]  fluticasone (FLONASE) 50 MCG/ACT nasal spray Place 2 sprays into both nostrils daily. Patient taking differently: Place 2 sprays into both nostrils daily as needed for allergies.  04/05/16  Yes [name], MD  gabapentin (NEURONTIN) 600 MG tablet Take 1 tablet (600 mg total) by mouth 3 (three) times daily. For anxiety and pain management. May have to stop to take ECT. 05/28/11  Yes Darrol Jump, MD  haloperidol (HALDOL) 5 MG tablet Take 1 tablet (5 mg total) by mouth at bedtime. For psychosis 11/15/15  Yes Barton Dubois, MD  hydrochlorothiazide (HYDRODIURIL) 25 MG tablet TAKE ONE TABLET BY MOUTH DAILY 01/11/17  Yes [name], MD  levocetirizine (XYZAL) 5 MG tablet Take 1 tablet (5 mg total) by mouth every evening. 06/20/17  Yes [name], MD  LINZESS 290 MCG CAPS capsule Take 1 capsule (290 mcg total) by mouth daily before breakfast. 01/11/17  Yes [name], MD  metoprolol succinate (TOPROL XL) 25 MG 24 hr tablet Take 0.5 tablets (12.5 mg total) by mouth daily. 03/30/17  Yes Barrett, Evelene Croon, PA-C  Multiple Vitamins-Minerals (MULTIVITAMIN WITH MINERALS) tablet Take 1 tablet by mouth daily.   Yes [provider]  oxyCODONE-acetaminophen (PERCOCET) 10-325 MG tablet Take 1 tablet by mouth 3 (three) times daily. 12/14/16  Yes [provider]  pantoprazole (PROTONIX) 40 MG tablet TAKE ONE TABLET BY MOUTH TWICE DAILY 08/08/17  Yes [name], MD  potassium chloride SA (KLOR-CON M20) 20 MEQ tablet Take 2 tablets (40 mEq total) by mouth daily. 11/02/16  Yes [name], MD  PROAIR HFA 108 [phone] Base) MCG/ACT inhaler INHALE TWO puffs BY MOUTH EVERY 4 HOURS AS NEEDED FOR WHEEZING 07/08/17  Yes [name], MD  promethazine (PHENERGAN) 25 MG tablet Take 1 tablet (25 mg total) by mouth every 6 (six) hours as needed for nausea or vomiting. 06/26/17   Yes Wynona Luna, MD  topiramate (TOPAMAX) 50 MG tablet TAKE ONE TABLET BY MOUTH TWICE DAILY 01/11/17  Yes [name], MD  traZODone (DESYREL) 100 MG tablet Take 100 mg by mouth at bedtime. 07/29/14  Yes [provider]  magic mouthwash SOLN Hydrocortisone 60mg , Nystatin suspension 64ml, diphenhydramine 12.5mg  QS 267ml, 1 tsp tid swish and spit. 07/21/17  [name], DPM  neomycin-polymyxin-hydrocortisone (CORTISPORIN) 3.5-10000-1 OTIC suspension Place 4 drops into both ears 3 (three) times daily. 04/15/17   Phebe Colla, PA-C    Family History Family History  Problem Relation Age of Onset  . Diabetes Mother   . COPD Mother   . Hypertension Mother   . Hyperlipidemia Mother   . Heart disease Father   . Hyperlipidemia Father   . Hypertension Father   . Cancer Father   . Heart disease Brother   . Heart disease Daughter   . Heart disease Maternal Grandmother   . Hypertension Maternal Grandmother   . Heart disease Maternal Grandfather   . Kidney cancer Paternal Grandmother   . Heart disease Paternal Grandfather     Social History Social History   Tobacco Use  . Smoking status: Never Smoker  . Smokeless tobacco: Never Used  Substance Use Topics  . Alcohol use: No  . Drug use: No     Allergies   Tetracyclines & related; Tramadol; Ciprofloxacin; Codeine; and Penicillins   Review of Systems Review of Systems  Constitutional: Negative for chills and fever.  Respiratory: Negative for cough and shortness of breath.   Cardiovascular: Positive for leg swelling (left). Negative for chest pain and palpitations.  Gastrointestinal: Negative for nausea and vomiting.  Skin: Positive for color change, rash and wound.     Physical Exam Triage Vital Signs ED Triage Vitals [09/24/17 1238]  Enc Vitals Group     BP 127/73     Pulse Rate 80     Resp 18     Temp 98 F (36.7 C)     Temp Source Oral     SpO2 100 %     Weight      Height      Head  Circumference      Peak Flow      Pain Score      Pain Loc      Pain Edu?      Excl. in [place]?    No data found.  Updated Vital Signs BP 127/73   Pulse 80   Temp 98 F (36.7 C) (Oral)   Resp 18   SpO2 100%      Physical Exam  Constitutional: She appears well-developed and well-nourished.  Eyes: Conjunctivae are normal.  Cardiovascular: Normal rate, regular rhythm, normal heart sounds and normal pulses.  LLE ankle edema. Non pitting.  No palpable cords or masses.No asymmetry in calf size when compared bilaterally LE hair growth symmetric and present. No discoloration of varicosities noted. LE warm and palpable pedal pulses.   Pulmonary/Chest: Effort normal and breath sounds normal. She has no wheezes. She has no rhonchi. She has no rales.  Musculoskeletal:       Feet:  2cm ulcer noted as per diagram. No purulent discharge  Neurological: She is alert.  Skin: Skin is warm and dry. There is erythema.      Erythema, tender increased warmth noted left medial ankle as noted on diagram. Edema, non pitting.Marland Kitchen Approx 5-6 cm in diameter.  skin is intact.  No purulent discharge.    Psychiatric: She has a normal mood and affect. Her speech is normal and behavior is normal. Thought content normal.  Vitals reviewed.    UC Treatments / Results  Labs (all labs ordered are listed, but only abnormal results are displayed) Labs Reviewed - No data to display  EKG None  Radiology No results found.  Procedures Procedures (including critical  care time)  Medications Ordered in UC Medications - No data to display  Initial Impression / Assessment and Plan / UC Course  I have reviewed the triage vital signs and the nursing notes.  Pertinent labs & imaging results that were available during my care of the patient were reviewed by me and considered in my medical decision making (see chart for details).      Final Clinical Impressions(s) / UC Diagnoses   Final diagnoses:  Leg  swelling  Cellulitis of left lower extremity  unilateral left leg swelling.  Patient is well-appearing.  No respiratory distress. Afebrile.  Presume uncomplicated cellulitis and need to change antibiotic therapy. Plan to r/u DVT and advised [place].  Unable to schedule ultrasound unfortunately today for patient.  At this gesture, we jointly agreed she go to emergency room to ensure no DVT.  Patient initially declined transport to the ED, however later decided to have escort; nurses escorted patient.    Discharge Instructions     Please [facility] for ultrasound to rule out of blood clot and treatment of cellulitis.         ED Prescriptions    None     Controlled Substance Prescriptions [place] Controlled Substance Registry consulted? Not Applicable   Burnard Hawthorne, FNP 09/24/17 [place], [place], FNP 09/24/17 [phone]

## 2017-09-24 NOTE — ED Notes (Signed)
Pt endorses ulcer to right foot that has been there for months. Pt now endorses a small red bump that popped up on the right lower inner leg earlier this week and the redness/inflammation is growing, now about 2.5 inches wide, Pt now has a small bump that has popped up on her right index finger. Has been treated for ulcer on right foot with antibiotics and treatments.

## 2017-09-24 NOTE — ED Notes (Signed)
Attempted to call report

## 2017-09-25 DIAGNOSIS — G894 Chronic pain syndrome: Secondary | ICD-10-CM | POA: Diagnosis not present

## 2017-09-25 DIAGNOSIS — L03116 Cellulitis of left lower limb: Principal | ICD-10-CM

## 2017-09-25 DIAGNOSIS — I10 Essential (primary) hypertension: Secondary | ICD-10-CM | POA: Diagnosis not present

## 2017-09-25 LAB — COMPREHENSIVE METABOLIC PANEL
ALT: 16 U/L (ref 14–54)
AST: 15 U/L (ref 15–41)
Albumin: 2.9 g/dL — ABNORMAL LOW (ref 3.5–5.0)
Alkaline Phosphatase: 74 U/L (ref 38–126)
Anion gap: 7 (ref 5–15)
BUN: 8 mg/dL (ref 6–20)
CO2: 28 mmol/L (ref 22–32)
Calcium: 8.6 mg/dL — ABNORMAL LOW (ref 8.9–10.3)
Chloride: 105 mmol/L (ref 101–111)
Creatinine, Ser: 0.59 mg/dL (ref 0.44–1.00)
GFR calc Af Amer: >60 mL/min (ref >60)
GFR calc non Af Amer: >60 mL/min (ref >60)
Glucose, Bld: 106 mg/dL — ABNORMAL HIGH (ref 65–99)
Potassium: 3.5 mmol/L (ref 3.5–5.1)
Sodium: 140 mmol/L (ref 135–145)
Total Bilirubin: 0.6 mg/dL (ref 0.3–1.2)
Total Protein: 6.1 g/dL — ABNORMAL LOW (ref 6.5–8.1)

## 2017-09-25 LAB — PHOSPHORUS: Phosphorus: 3.6 mg/dL (ref 2.5–4.6)

## 2017-09-25 LAB — CBC
HCT: 34.1 % — ABNORMAL LOW (ref 36.0–46.0)
Hemoglobin: 11.2 g/dL — ABNORMAL LOW (ref 12.0–15.0)
MCH: 32.6 pg (ref 26.0–34.0)
MCHC: 32.8 g/dL (ref 30.0–36.0)
MCV: 99.1 fL (ref 78.0–100.0)
Platelets: 249 10*3/uL (ref 150–400)
RBC: 3.44 MIL/uL — ABNORMAL LOW (ref 3.87–5.11)
RDW: 13.1 % (ref 11.5–15.5)
WBC: 5.8 10*3/uL (ref 4.0–10.5)

## 2017-09-25 LAB — TSH: TSH: 1.694 u[IU]/mL (ref 0.350–4.500)

## 2017-09-25 LAB — MAGNESIUM: Magnesium: 2.2 mg/dL (ref 1.7–2.4)

## 2017-09-25 MED ORDER — ACETAMINOPHEN 325 MG PO TABS
650.0000 mg | ORAL_TABLET | Freq: Four times a day (QID) | ORAL | Status: DC | PRN
Start: 2017-09-25 — End: 2017-09-26
  Administered 2017-09-25: 650 mg via ORAL
  Filled 2017-09-25: qty 2

## 2017-09-25 MED ORDER — MORPHINE SULFATE ER 15 MG PO TBCR
30.0000 mg | EXTENDED_RELEASE_TABLET | Freq: Two times a day (BID) | ORAL | Status: DC
  Administered 2017-09-25 – 2017-09-26 (×3): 30 mg via ORAL
  Filled 2017-09-25 (×3): qty 2

## 2017-09-25 MED ORDER — METHYLPREDNISOLONE SODIUM SUCC 40 MG IJ SOLR
40.0000 mg | Freq: Once | INTRAMUSCULAR | Status: AC
  Administered 2017-09-25: 40 mg via INTRAVENOUS
  Filled 2017-09-25: qty 1

## 2017-09-25 NOTE — Consult Note (Signed)
[facility] Nurse wound consult note Reason for Consult: Plantar full thickness DFU being treated by Dr. Earleen Newport at [facility].  Last seen by that provider on Thursday, 09/22/17.  New onset of erythematous areas at the left medial foot and left medial LE. She will return to the care of Dr. Earleen Newport post discharge. Wound type:infectious Pressure Injury POA: N/A Measurement:left LE:  4cm x 3cm purple/red discoloration with warmth, no induration,no fluctuance Left medial foot: 2cm x 0.5cm of erythema and warmth, no induration in an area measure 9cm x 4cm  Plantar full thickness ulcer:  0.8cm x 0.6cm x 0.2cm with dry, red wound bed Wound bed:Ass above Drainage (amount, consistency, odor) See above Periwound:See above Dressing procedure/placement/frequency: I will implement an antimicrobial dressing. Patient is on system antibiotics.  [facility] nursing team will not follow, but will remain available to this patient, the nursing and medical teams.  Please re-consult if needed. Thanks, Maudie Flakes, MSN, RN, [place], Arther Abbott  Pager# [phone]

## 2017-09-25 NOTE — Progress Notes (Signed)
PROGRESS NOTE    [name] [name]  HYI:[id number] DOB: January 19, 1966 DOA: 09/24/2017 PCP: [name], MD    Brief Narrative:  52 y.o. female with medical history significant of ulcerations of bilateral great toes, neuropathy, asthma, bipolar disorder, depression, GERD, hypertension, seizures, sleep apnea   Presented with small circular red subcutaneous nodule what has increased the size of her past 2 days affecting left lower extremity associated fevers she has been taking Keflex 3 days but does not seem to be improving.she has processed father and maybe bug bite. She has been seen for this at urgent care center emergency department.  Patient reports chills no fever, diffuse body aches are chronic from severe arthritis Of note she also noted similar bump on her right index finger as well And has chronic bilateral feet ulcers on the right great 2 on left sub-first met M PG joint she's been followed for this by podiatry. She has been treated with Bactrim daily dressing changes. Last time was seen by podiatry 2 days ago  Assessment & Plan:   Active Problems:   Hypokalemia   Chronic pain syndrome   Asthma   Essential hypertension   Hypertension   Left leg cellulitis   Cellulitis  . Cellulitis -Patient is continued on empiric antibiotics -Skin lesions appear atypical in nature -On further questioning, patient reports historically having lesions improve taking prednisone for other reasons such as joint pains. -We will give trial of steroids. -ESR noted to be mildly elevated -ANA pending, was obtained -Patient afebrile  . Asthma  -Currently on minimal O2 support -Continue with albuterol when necessary  . Chronic pain syndrome -Continue with home regimen as needed   . Essential hypertension  -Remained stable at this time  -continue home medications  . Hypokalemia  -Was replaced, continue to follow letter lites and replace as needed  DVT prophylaxis: Lovenox  subcu Code Status: Full code Family Communication: Patient in room, family not at bedside Disposition Plan: Uncertain at this time  Consultants:     Procedures:     Antimicrobials: Anti-infectives (From admission, onward)   Start     Dose/Rate Route Frequency Ordered Stop   09/25/17 0400  clindamycin (CLEOCIN) IVPB 600 mg     600 mg 100 mL/hr over 30 Minutes Intravenous Every 8 hours 09/24/17 2044     09/24/17 1615  clindamycin (CLEOCIN) IVPB 600 mg     600 mg 100 mL/hr over 30 Minutes Intravenous  Once 09/24/17 1607 09/24/17 1751       Subjective: Without any complaints this morning  Objective: Vitals:   09/24/17 2150 09/25/17 0547 09/25/17 1042 09/25/17 1403  BP: 123/79 133/88 110/67 107/68  Pulse: 63 80 77 80  Resp: 17 16    Temp: 97.7 F (36.5 C) 97.6 F (36.4 C)  98.1 F (36.7 C)  TempSrc: Oral Oral  Oral  SpO2: 97% 98%  96%    Intake/Output Summary (Last 24 hours) at 09/25/2017 1837 Last data filed at 09/25/2017 1430 Gross per 24 hour  Intake 720 ml  Output -  Net 720 ml   There were no vitals filed for this visit.  Examination:  General exam: Appears calm and comfortable  Respiratory system: Clear to auscultation. Respiratory effort normal. Cardiovascular system: S1 & S2 heard, RRR. Gastrointestinal system: Abdomen is nondistended, soft and nontender Central nervous system: Alert and oriented. No focal neurological deficits. Extremities: Symmetric 5 x 5 power. Skin: Multiple areas of patchy erythema across bilateral legs most notably over  anterior left shin Psychiatry: Judgement and insight appear normal. Mood & affect appropriate.   Data Reviewed: I have personally reviewed following labs and imaging studies  CBC: Recent Labs  Lab 09/24/17 1427 09/25/17 0642  WBC 7.2 5.8  NEUTROABS 4.9  --   HGB 12.9 11.2*  HCT 39.9 34.1*  MCV 99.3 99.1  PLT 265 621   Basic Metabolic Panel: Recent Labs  Lab 09/24/17 1419 09/24/17 1427 09/25/17 0642   NA  --  140 140  K  --  3.4* 3.5  CL  --  104 105  CO2  --  27 28  GLUCOSE  --  98 106*  BUN  --  9 8  CREATININE  --  0.75 0.59  CALCIUM  --  9.5 8.6*  MG 2.1  --  2.2  PHOS  --   --  3.6   GFR: CrCl cannot be calculated (Unknown ideal weight.). Liver Function Tests: Recent Labs  Lab 09/24/17 1419 09/25/17 0642  AST 17 15  ALT 17 16  ALKPHOS 89 74  BILITOT 0.6 0.6  PROT 7.6 6.1*  ALBUMIN 3.8 2.9*   No results for input(s): LIPASE, AMYLASE in the last 168 hours. No results for input(s): AMMONIA in the last 168 hours. Coagulation Profile: No results for input(s): INR, PROTIME in the last 168 hours. Cardiac Enzymes: No results for input(s): CKTOTAL, CKMB, CKMBINDEX, TROPONINI in the last 168 hours. BNP (last 3 results) No results for input(s): PROBNP in the last 8760 hours. HbA1C: No results for input(s): HGBA1C in the last 72 hours. CBG: No results for input(s): GLUCAP in the last 168 hours. Lipid Profile: No results for input(s): CHOL, HDL, LDLCALC, TRIG, CHOLHDL, LDLDIRECT in the last 72 hours. Thyroid Function Tests: Recent Labs    09/25/17 0642  TSH 1.694   Anemia Panel: No results for input(s): VITAMINB12, FOLATE, FERRITIN, TIBC, IRON, RETICCTPCT in the last 72 hours. Sepsis Labs: No results for input(s): PROCALCITON, LATICACIDVEN in the last 168 hours.  Recent Results (from the past 240 hour(s))  Culture, blood (Routine X 2) w Reflex to ID Panel     Status: None (Preliminary result)   Collection Time: 09/24/17  9:15 PM  Result Value Ref Range Status   Specimen Description BLOOD RIGHT FOREARM  Final   Special Requests   Final    BOTTLES DRAWN AEROBIC AND ANAEROBIC Blood Culture adequate volume   Culture   Final    NO GROWTH < 24 HOURS Performed at [facility], [address], [place], [place] [address]    Report Status PENDING  Incomplete  Culture, blood (Routine X 2) w Reflex to ID Panel     Status: None (Preliminary result)   Collection  Time: 09/24/17  9:36 PM  Result Value Ref Range Status   Specimen Description BLOOD LEFT ANTECUBITAL  Final   Special Requests   Final    BOTTLES DRAWN AEROBIC AND ANAEROBIC Blood Culture adequate volume   Culture   Final    NO GROWTH < 24 HOURS Performed at [facility], [place] [address] [place], [place] [address]    Report Status PENDING  Incomplete     Radiology Studies: Dg Tibia/fibula Left  Result Date: 09/24/2017 CLINICAL DATA:  Ulceration medially in the distal lower leg. History of skin ulcers and pre diabetes. History of neuropathy. EXAM: LEFT TIBIA AND FIBULA - 2 VIEW COMPARISON:  None. FINDINGS: The mineralization and alignment are normal. There is no evidence  of acute fracture or dislocation. There is no evidence of bone destruction. Mild spurring at the quadriceps insertion on the patella is noted. There is some edema medially in the distal lower leg, but no evidence of soft tissue emphysema or foreign body. IMPRESSION: No acute osseous findings.  Medial soft tissue swelling distally. Electronically Signed   By: Richardean Sale M.D.   On: 09/24/2017 16:25   Dg Foot 2 Views Left  Result Date: 09/24/2017 CLINICAL DATA:  History of foot ulcers. Current foot ulcer over the plantar aspect of the foot near the head of the 1st metatarsal. History of pre diabetes and neuropathy. EXAM: LEFT FOOT - 2 VIEW COMPARISON:  Left ankle MRI 04/14/2010. FINDINGS: The mineralization and alignment are normal. There is no evidence of acute fracture, dislocation or bone destruction. Patient is status post cannulated screw arthrodesis of the interphalangeal joint of the great toe. There are mild degenerative changes at the 1st metatarsophalangeal joint. Possible fusion across the 3rd and 4th proximal interphalangeal joints. Mild midfoot degenerative changes and small posterior calcaneal spur. No soft tissue emphysema or other foreign body seen. IMPRESSION: No radiographic evidence of osteomyelitis.  Degenerative and postsurgical changes as described Electronically Signed   By: Richardean Sale M.D.   On: 09/24/2017 16:22    Scheduled Meds: . busPIRone  10 mg Oral TID  . citalopram  40 mg Oral QHS  . enoxaparin (LOVENOX) injection  40 mg Subcutaneous Q24H  . gabapentin  600 mg Oral TID  . haloperidol  5 mg Oral QHS  . linaclotide  290 mcg Oral QAC breakfast  . loratadine  5 mg Oral QPM  . metoprolol succinate  12.5 mg Oral Daily  . morphine  30 mg Oral BID  . pantoprazole  40 mg Oral BID  . potassium chloride SA  40 mEq Oral Daily  . topiramate  50 mg Oral BID  . traZODone  100 mg Oral QHS   Continuous Infusions: . clindamycin (CLEOCIN) IV Stopped (09/25/17 1430)     LOS: 1 day   Marylu Lund, MD [facility] Pager [phone]  If 7PM-7AM, please contact night-coverage [email or web address] Password [facility] 09/25/2017, 6:37 PM

## 2017-09-25 NOTE — Plan of Care (Signed)
°  Problem: Clinical Measurements: °Goal: Ability to maintain clinical measurements within normal limits will improve °Outcome: Progressing °  °Problem: Activity: °Goal: Risk for activity intolerance will decrease °Outcome: Progressing °  °Problem: Nutrition: °Goal: Adequate nutrition will be maintained °Outcome: Progressing °  °

## 2017-09-25 NOTE — Progress Notes (Signed)
Patient arrived to unit from ED. Report received from Jeneen Rinks, [place]. Patient stable, alert and oriented x4. CPAP ordered for patient.

## 2017-09-26 ENCOUNTER — Encounter (HOSPITAL_COMMUNITY): Payer: Self-pay | Admitting: General Practice

## 2017-09-26 DIAGNOSIS — G894 Chronic pain syndrome: Secondary | ICD-10-CM | POA: Diagnosis not present

## 2017-09-26 DIAGNOSIS — L03116 Cellulitis of left lower limb: Secondary | ICD-10-CM | POA: Diagnosis not present

## 2017-09-26 DIAGNOSIS — I10 Essential (primary) hypertension: Secondary | ICD-10-CM | POA: Diagnosis not present

## 2017-09-26 LAB — ANA W/REFLEX IF POSITIVE: Anti Nuclear Antibody(ANA): NEGATIVE

## 2017-09-26 MED ORDER — CLINDAMYCIN HCL 300 MG PO CAPS: 300.0000 mg | ORAL_CAPSULE | Freq: Three times a day (TID) | ORAL | 0 refills | Status: DC

## 2017-09-26 MED ORDER — PREDNISONE 20 MG PO TABS
40.0000 mg | ORAL_TABLET | Freq: Every day | ORAL | Status: DC
  Administered 2017-09-26: 40 mg via ORAL
  Filled 2017-09-26: qty 2

## 2017-09-26 MED ORDER — PREDNISONE 10 MG PO TABS: 10.0000 mg | ORAL_TABLET | Freq: Every day | ORAL | Status: DC

## 2017-09-26 NOTE — Progress Notes (Signed)
Subjective: Kamisha presents the office today for follow-up evaluation of wounds to both of her feet.  She states the right side is doing very well and is healed however she is concerned about the left side.  She states that over the last couple days she has noticed some redness around the area but she denies any drainage or pus or any red streaks.  Denies any recent injury or trauma.  She is been wearing the surgical shoes with offloading PegAssist. Denies any systemic complaints such as fevers, chills, nausea, vomiting. No acute changes since last appointment, and no other complaints at this time.   Objective: AAO x3, NAD DP/PT pulses palpable bilaterally, CRT less than 3 seconds On the plantar aspect of the right hallux continues to be ulceration however it is much improved today measures 0.2 x 0.2 cm.  There is no surrounding erythema, edema, drainage or pus or any clinical signs of infection there is no fluctuation or crepitation. Left submetatarsal : Ulceration measures larger at 0.8 x 0.5 cm no surrounding erythema however there is no ascending cellulitis.  There is no fluctuation or crepitation.  There is no malodor.  No drainage or pus. No other open lesions or pre-ulcerative lesion identified today. Chronic digital deformities are present. No pain with calf compression, swelling, warmth, erythema  Assessment: Chronic ulcerations bilaterally with localized erythema left side  Plan: -All treatment options discussed with the patient including all alternatives, risks, complications.  -Given the erythema recommended to start antibiotics.  Will start Keflex.  There is any issues taking this medication with any side effects she is to call the office. -Recommended Betadine on the wound to the left side daily which was applied today. -Continue offloading at all times.  I put new PegAssist in the surgical shoes.  -Monitor for any clinical signs or symptoms of infection and directed to call the  office immediately should any occur or go to the ER.  Return in about 2 weeks (around 10/06/2017).  [name] DPM

## 2017-09-26 NOTE — Discharge Instructions (Signed)

## 2017-09-26 NOTE — Discharge Summary (Addendum)
Physician Discharge Summary  [name] [name] JKD:[id number] DOB: August 12, 1965 DOA: 09/24/2017  PCP: [name], MD  Admit date: 09/24/2017 Discharge date: 09/26/2017  Admitted From: Home Disposition:  Home  Recommendations for Outpatient Follow-up:  1. Follow up with PCP in 1-2 weeks 2. Follow up with Podiatry as scheduled    Discharge Condition:Improved CODE STATUS:Full Diet recommendation: Heart healthy   Brief/Interim Summary: 52 y.o.femalewith medical history significant of ulcerations of bilateral great toes, neuropathy, asthma, bipolar disorder, depression, GERD, hypertension, seizures, sleep apnea  Presented withsmall circular red subcutaneous nodule what has increased the size of her past 2 days affecting left lower extremity associated fevers she has been taking Keflex3 daysbut does not seem to be improving.she has processed father and maybe bug bite. She has been seen for this at urgent care center emergency department. Patient reports chills no fever, diffuse body aches are chronic from severe arthritis Of note she also noted similar bump on her right index finger as well And has chronic bilateral feet ulcers on the right great 2 on left sub-first met M PG joint she's been followed for this by podiatry. She has been treated with Bactrim daily dressing changes. Last time was seen by podiatry 2 days ago  . Cellulitis -Patient is continued on empiric antibiotics -Skin lesions appear atypical in nature -On further questioning, patient reports historically having lesions improve taking prednisone for other reasons such as joint pains. -Improved after trial of steroid -ESR noted to be mildly elevated -ANA neg -Patient afebrile -Will complete course of abx on discharge with steroid taper  . Asthma -Currently on minimal O2 support -Continue with albuterol when necessary  . Chronic pain syndrome -Continue with home regimen as needed   . Essential  hypertension -Remained stable at this time  -continue home medications  . Hypokalemia -corrected   Discharge Diagnoses:  Active Problems:   Hypokalemia   Chronic pain syndrome   Asthma   Essential hypertension   Hypertension   Left leg cellulitis   Cellulitis    Discharge Instructions   Allergies as of 09/26/2017      Reactions   Penicillins Hives, Rash   Has patient had a PCN reaction causing immediate rash, facial/tongue/throat swelling, SOB or lightheadedness with hypotension: Yes Has patient had a PCN reaction causing severe rash involving mucus membranes or skin necrosis: No Has patient had a PCN reaction that required hospitalization: No Has patient had a PCN reaction occurring within the last 10 years: No If all of the above answers are "NO", then may proceed with Cephalosporin use.   Tetracyclines & Related Other (See Comments)   Syncope and put her "in a coma"   Tramadol Other (See Comments)   Seizures   Tetracycline Other (See Comments)   Syncope and "Put me in a coma"   Ciprofloxacin Rash, Itching   Codeine Itching, Rash   Estradiol Rash   Patches broke out the skin      Medication List    STOP taking these medications   cephALEXin 500 MG capsule Commonly known as:  KEFLEX     TAKE these medications   ARTIFICIAL TEARS PF 0.1-0.3 % Soln Generic drug:  Dextran 70-Hypromellose (PF) Place 1-2 drops into both eyes 3 (three) times daily as needed (for dry eyes).   busPIRone 10 MG tablet Commonly known as:  BUSPAR Take 1 tablet by mouth three times a day For anxiety   CALCIUM 1200 PO Take 1 tablet by mouth daily.   citalopram  40 MG tablet Commonly known as:  CELEXA Take 40 mg by mouth at bedtime.   clindamycin 300 MG capsule Commonly known as:  CLEOCIN Take 1 capsule (300 mg total) by mouth 3 (three) times daily for 10 days.   clotrimazole 1 % cream Commonly known as:  LOTRIMIN Apply 1 application topically 2 (two) times daily.    cyclobenzaprine 10 MG tablet Commonly known as:  FLEXERIL Take 10 mg by mouth 2 (two) times daily as needed for muscle spasms.   EMBEDA 30-1.2 MG Cpcr Generic drug:  Morphine-Naltrexone Take 1 capsule by mouth 2 (two) times daily.   fluticasone 50 MCG/ACT nasal spray Commonly known as:  FLONASE Place 2 sprays into both nostrils daily. What changed:    when to take this  reasons to take this   gabapentin 600 MG tablet Commonly known as:  NEURONTIN Take 1 tablet (600 mg total) by mouth 3 (three) times daily. For anxiety and pain management. May have to stop to take ECT. What changed:    when to take this  additional instructions   haloperidol 5 MG tablet Commonly known as:  HALDOL Take 1 tablet (5 mg total) by mouth at bedtime. For psychosis   hydrochlorothiazide 25 MG tablet Commonly known as:  HYDRODIURIL TAKE ONE TABLET BY MOUTH DAILY   levocetirizine 5 MG tablet Commonly known as:  XYZAL Take 1 tablet (5 mg total) by mouth every evening.   LINZESS 290 MCG Caps capsule Generic drug:  linaclotide Take 1 capsule (290 mcg total) by mouth daily before breakfast.   meloxicam 15 MG tablet Commonly known as:  MOBIC Take 15 mg by mouth daily.   metoprolol succinate 25 MG 24 hr tablet Commonly known as:  TOPROL XL Take 0.5 tablets (12.5 mg total) by mouth daily.   multivitamin with minerals tablet Take 1 tablet by mouth daily.   oxyCODONE-acetaminophen 10-325 MG tablet Commonly known as:  PERCOCET Take 1 tablet by mouth 3 (three) times daily.   pantoprazole 40 MG tablet Commonly known as:  PROTONIX TAKE ONE TABLET BY MOUTH TWICE DAILY   potassium chloride SA 20 MEQ tablet Commonly known as:  KLOR-CON M20 Take 2 tablets (40 mEq total) by mouth daily. What changed:    how much to take  when to take this   predniSONE 10 MG tablet Commonly known as:  DELTASONE Take 1 tablet (10 mg total) by mouth daily.   PROAIR HFA 108 (90 Base) MCG/ACT  inhaler Generic drug:  albuterol INHALE TWO puffs BY MOUTH EVERY 4 HOURS AS NEEDED FOR WHEEZING   promethazine 25 MG tablet Commonly known as:  PHENERGAN Take 1 tablet (25 mg total) by mouth every 6 (six) hours as needed for nausea or vomiting.   topiramate 50 MG tablet Commonly known as:  TOPAMAX TAKE ONE TABLET BY MOUTH TWICE DAILY   traZODone 100 MG tablet Commonly known as:  DESYREL Take 100 mg by mouth at bedtime.      Follow-up Information    [name], MD. Schedule an appointment as soon as possible for a visit in 1 week(s).   Specialty:  Family Medicine Contact information: [address] [place] [address] [phone]        [name], MD .   Specialties:  Cardiology, Radiology Contact information: [address] [place] [address] [phone]        [name], DPM. Schedule an appointment as soon as possible for  a visit.   Specialty:  Podiatry Contact information: [address] [place] [address] [phone]          Allergies  Allergen Reactions  . Penicillins Hives and Rash    Has patient had a PCN reaction causing immediate rash, facial/tongue/throat swelling, SOB or lightheadedness with hypotension: Yes Has patient had a PCN reaction causing severe rash involving mucus membranes or skin necrosis: No Has patient had a PCN reaction that required hospitalization: No Has patient had a PCN reaction occurring within the last 10 years: No If all of the above answers are "NO", then may proceed with Cephalosporin use.  . Tetracyclines & Related Other (See Comments)    Syncope and put her "in a coma"  . Tramadol Other (See Comments)    Seizures  . Tetracycline Other (See Comments)    Syncope and "Put me in a coma"  . Ciprofloxacin Rash and Itching  . Codeine Itching and Rash  . Estradiol Rash    Patches broke out the skin     Procedures/Studies: Dg Tibia/fibula  Left  Result Date: 09/24/2017 CLINICAL DATA:  Ulceration medially in the distal lower leg. History of skin ulcers and pre diabetes. History of neuropathy. EXAM: LEFT TIBIA AND FIBULA - 2 VIEW COMPARISON:  None. FINDINGS: The mineralization and alignment are normal. There is no evidence of acute fracture or dislocation. There is no evidence of bone destruction. Mild spurring at the quadriceps insertion on the patella is noted. There is some edema medially in the distal lower leg, but no evidence of soft tissue emphysema or foreign body. IMPRESSION: No acute osseous findings.  Medial soft tissue swelling distally. Electronically Signed   By: Richardean Sale M.D.   On: 09/24/2017 16:25   Dg Foot 2 Views Left  Result Date: 09/24/2017 CLINICAL DATA:  History of foot ulcers. Current foot ulcer over the plantar aspect of the foot near the head of the 1st metatarsal. History of pre diabetes and neuropathy. EXAM: LEFT FOOT - 2 VIEW COMPARISON:  Left ankle MRI 04/14/2010. FINDINGS: The mineralization and alignment are normal. There is no evidence of acute fracture, dislocation or bone destruction. Patient is status post cannulated screw arthrodesis of the interphalangeal joint of the great toe. There are mild degenerative changes at the 1st metatarsophalangeal joint. Possible fusion across the 3rd and 4th proximal interphalangeal joints. Mild midfoot degenerative changes and small posterior calcaneal spur. No soft tissue emphysema or other foreign body seen. IMPRESSION: No radiographic evidence of osteomyelitis. Degenerative and postsurgical changes as described Electronically Signed   By: Richardean Sale M.D.   On: 09/24/2017 16:22    Subjective: Without complaints. Eager to go home  Discharge Exam: Vitals:   09/26/17 0542 09/26/17 1411  BP: 118/76 102/64  Pulse: (!) 49 69  Resp: 16 16  Temp: 97.9 F (36.6 C) 98.2 F (36.8 C)  SpO2: 99% 94%   Vitals:   09/25/17 2133 09/26/17 0000 09/26/17 0542 09/26/17  1411  BP: 106/73  118/76 102/64  Pulse: (!) 54 60 (!) 49 69  Resp: '17 16 16 16  '$ Temp: 98.3 F (36.8 C)  97.9 F (36.6 C) 98.2 F (36.8 C)  TempSrc: Oral  Oral Oral  SpO2: 95% 97% 99% 94%    General: Pt is alert, awake, not in acute distress Cardiovascular: RRR, S1/S2 +, no rubs, no gallops Respiratory: CTA bilaterally, no wheezing, no rhonchi Abdominal: Soft, NT, ND, bowel sounds + Extremities: no edema, no cyanosis  The results of significant diagnostics from this hospitalization (including imaging, microbiology, ancillary and laboratory) are listed below for reference.     Microbiology: Recent Results (from the past 240 hour(s))  Culture, blood (Routine X 2) w Reflex to ID Panel     Status: None (Preliminary result)   Collection Time: 09/24/17  9:15 PM  Result Value Ref Range Status   Specimen Description BLOOD RIGHT FOREARM  Final   Special Requests   Final    BOTTLES DRAWN AEROBIC AND ANAEROBIC Blood Culture adequate volume   Culture   Final    NO GROWTH 2 DAYS Performed at [facility], [address], [place], [place] [address]    Report Status PENDING  Incomplete  Culture, blood (Routine X 2) w Reflex to ID Panel     Status: None (Preliminary result)   Collection Time: 09/24/17  9:36 PM  Result Value Ref Range Status   Specimen Description BLOOD LEFT ANTECUBITAL  Final   Special Requests   Final    BOTTLES DRAWN AEROBIC AND ANAEROBIC Blood Culture adequate volume   Culture   Final    NO GROWTH 2 DAYS Performed at [facility], [place] [address], [place], [place] [address]    Report Status PENDING  Incomplete     Labs: BNP (last 3 results) No results for input(s): BNP in the last 8760 hours. Basic Metabolic Panel: Recent Labs  Lab 09/24/17 1419 09/24/17 1427 09/25/17 0642  NA  --  140 140  K  --  3.4* 3.5  CL  --  104 105  CO2  --  27 28  GLUCOSE  --  98 106*  BUN  --  9 8  CREATININE  --  0.75 0.59  CALCIUM  --  9.5 8.6*  MG 2.1  --   2.2  PHOS  --   --  3.6   Liver Function Tests: Recent Labs  Lab 09/24/17 1419 09/25/17 0642  AST 17 15  ALT 17 16  ALKPHOS 89 74  BILITOT 0.6 0.6  PROT 7.6 6.1*  ALBUMIN 3.8 2.9*   No results for input(s): LIPASE, AMYLASE in the last 168 hours. No results for input(s): AMMONIA in the last 168 hours. CBC: Recent Labs  Lab 09/24/17 1427 09/25/17 0642  WBC 7.2 5.8  NEUTROABS 4.9  --   HGB 12.9 11.2*  HCT 39.9 34.1*  MCV 99.3 99.1  PLT 265 249   Cardiac Enzymes: No results for input(s): CKTOTAL, CKMB, CKMBINDEX, TROPONINI in the last 168 hours. BNP: Invalid input(s): POCBNP CBG: No results for input(s): GLUCAP in the last 168 hours. D-Dimer No results for input(s): DDIMER in the last 72 hours. Hgb A1c No results for input(s): HGBA1C in the last 72 hours. Lipid Profile No results for input(s): CHOL, HDL, LDLCALC, TRIG, CHOLHDL, LDLDIRECT in the last 72 hours. Thyroid function studies Recent Labs    09/25/17 0642  TSH 1.694   Anemia work up No results for input(s): VITAMINB12, FOLATE, FERRITIN, TIBC, IRON, RETICCTPCT in the last 72 hours. Urinalysis    Component Value Date/Time   COLORURINE YELLOW 07/05/2017 1528   APPEARANCEUR CLEAR 07/05/2017 1528   LABSPEC 1.015 07/05/2017 1528   PHURINE 5.5 07/05/2017 1528   GLUCOSEU NEGATIVE 07/05/2017 1528   HGBUR TRACE (A) 07/05/2017 1528   BILIRUBINUR NEGATIVE 06/23/2017 1108   KETONESUR NEGATIVE 07/05/2017 1528   PROTEINUR NEGATIVE 07/05/2017 1528   UROBILINOGEN 0.2 06/23/2017 1108   NITRITE NEGATIVE 07/05/2017 1528  LEUKOCYTESUR NEGATIVE 07/05/2017 1528   Sepsis Labs Invalid input(s): PROCALCITONIN,  WBC,  LACTICIDVEN Microbiology Recent Results (from the past 240 hour(s))  Culture, blood (Routine X 2) w Reflex to ID Panel     Status: None (Preliminary result)   Collection Time: 09/24/17  9:15 PM  Result Value Ref Range Status   Specimen Description BLOOD RIGHT FOREARM  Final   Special Requests   Final     BOTTLES DRAWN AEROBIC AND ANAEROBIC Blood Culture adequate volume   Culture   Final    NO GROWTH 2 DAYS Performed at [facility], [place] [address], [place], [place] [address]    Report Status PENDING  Incomplete  Culture, blood (Routine X 2) w Reflex to ID Panel     Status: None (Preliminary result)   Collection Time: 09/24/17  9:36 PM  Result Value Ref Range Status   Specimen Description BLOOD LEFT ANTECUBITAL  Final   Special Requests   Final    BOTTLES DRAWN AEROBIC AND ANAEROBIC Blood Culture adequate volume   Culture   Final    NO GROWTH 2 DAYS Performed at [facility], [place] [address], [place], [place] [address]    Report Status PENDING  Incomplete   Time spent: 73mn  SIGNED:   SMarylu Lund MD  [facility] 09/26/2017, 4:10 PM  If 7PM-7AM, please contact night-coverage [email or web address] Password [email or web address]

## 2017-09-26 NOTE — Progress Notes (Signed)
Pt is discharged to go home.  Discharge orders and prescriptions given.

## 2017-09-29 ENCOUNTER — Other Ambulatory Visit: Payer: Self-pay | Admitting: Family Medicine

## 2017-09-29 DIAGNOSIS — J328 Other chronic sinusitis: Secondary | ICD-10-CM

## 2017-09-29 LAB — CULTURE, BLOOD (ROUTINE X 2)
Culture: NO GROWTH
Culture: NO GROWTH
Special Requests: ADEQUATE
Special Requests: ADEQUATE

## 2017-10-03 ENCOUNTER — Ambulatory Visit (INDEPENDENT_AMBULATORY_CARE_PROVIDER_SITE_OTHER): Payer: 59 | Admitting: Podiatry

## 2017-10-03 ENCOUNTER — Encounter: Payer: Self-pay | Admitting: Podiatry

## 2017-10-03 ENCOUNTER — Telehealth: Payer: Self-pay | Admitting: Podiatry

## 2017-10-03 DIAGNOSIS — L97522 Non-pressure chronic ulcer of other part of left foot with fat layer exposed: Secondary | ICD-10-CM

## 2017-10-03 DIAGNOSIS — L03116 Cellulitis of left lower limb: Secondary | ICD-10-CM | POA: Diagnosis not present

## 2017-10-03 DIAGNOSIS — L97512 Non-pressure chronic ulcer of other part of right foot with fat layer exposed: Secondary | ICD-10-CM | POA: Diagnosis not present

## 2017-10-03 MED ORDER — CLINDAMYCIN HCL 300 MG PO CAPS: 300.0000 mg | ORAL_CAPSULE | Freq: Three times a day (TID) | ORAL | 0 refills | Status: DC

## 2017-10-03 NOTE — Telephone Encounter (Signed)
I saw Dr. Jacqualyn Posey today. He said he was going to call in a prescription for clindamycin and my pharmacy did not get the request. If you could please check with him and see if he can call that in for me. My number is [phone]. Thank you. Bye bye.

## 2017-10-04 ENCOUNTER — Ambulatory Visit: Payer: 59 | Admitting: Podiatry

## 2017-10-06 ENCOUNTER — Ambulatory Visit (INDEPENDENT_AMBULATORY_CARE_PROVIDER_SITE_OTHER): Payer: 59 | Admitting: Family Medicine

## 2017-10-06 ENCOUNTER — Encounter: Payer: Self-pay | Admitting: Family Medicine

## 2017-10-06 VITALS — BP 118/80 | HR 76 | Temp 98.1°F | Resp 18 | Ht 63.5 in | Wt 234.0 lb

## 2017-10-06 DIAGNOSIS — Z79890 Hormone replacement therapy (postmenopausal): Secondary | ICD-10-CM | POA: Diagnosis not present

## 2017-10-06 DIAGNOSIS — Z09 Encounter for follow-up examination after completed treatment for conditions other than malignant neoplasm: Secondary | ICD-10-CM | POA: Diagnosis not present

## 2017-10-06 MED ORDER — ESTRADIOL 1 MG PO TABS: 1.0000 mg | ORAL_TABLET | Freq: Every day | ORAL | 3 refills | Status: DC

## 2017-10-06 NOTE — Progress Notes (Signed)
Subjective:    Patient ID: [name] [name], female    DOB: 04/07/1966, 52 y.o.   MRN: [id number]  HPI Patient was recently admitted to the hospital and diagnosed with cellulitis in the left lower extremity just above her ankle.  I have copied relevant portions of the discharge summary and included them below for my reference: Admit date: 09/24/2017 Discharge date: 09/26/2017  Admitted From: Home Disposition:  Home  Recommendations for Outpatient Follow-up:  1. Follow up with PCP in 1-2 weeks 2. Follow up with Podiatry as scheduled    Discharge Condition:Improved CODE STATUS:Full Diet recommendation: Heart healthy   Brief/Interim Summary: 52 y.o.femalewith medical history significant of ulcerations of bilateral great toes, neuropathy, asthma, bipolar disorder, depression, GERD, hypertension, seizures, sleep apnea  Presented withsmall circular red subcutaneous nodule what has increased the size of her past 2 days affecting left lower extremity associated fevers she has been taking Keflex3 daysbut does not seem to be improving.she has processed father and maybe bug bite. She has been seen for this at urgent care center emergency department. Patient reports chills no fever, diffuse body aches are chronic from severe arthritis Of note she also noted similar bump on her right index finger as well And has chronic bilateral feet ulcers on the right great 2 on left sub-first met M PG joint she's been followed for this by podiatry. She has been treated with Bactrim daily dressing changes. Last time was seen by podiatry 2 days ago  .Cellulitis -Patient is continued on empiric antibiotics -Skin lesions appear atypical in nature -On further questioning, patient reports historically having lesions improve taking prednisone for other reasons such as joint pains. -Improved after trial of steroid -ESR noted to be mildly elevated -ANA neg -Patient afebrile -Will complete course of  abx on discharge with steroid taper  .Asthma -Currently on minimal O2 support -Continue withalbuterol when necessary  .Chronic pain syndrome -Continue with home regimen as needed  .Essential hypertension -Remained stable at this time -continue home medications  .Hypokalemia -corrected   She is here today for follow-up.  She has completed her course of prescribed antibiotics from the hospital.  The erythema around her left ankle has completely resolved.  There is no warmth.  There is no pain.  There is no swelling.  There are no visible lesions on the left ankle or on the lower left leg as mentioned in the discharge summary.  There are a few scabs on the right knee but otherwise her exam is normal.  She denies any further pain in her around the left ankle.  From the standpoint of the cellulitis she is completely resolved.  However she has recently resumed antibiotics under the care of her podiatrist due to ulcers on the plantar surfaces of her feet.  These are bandaged today and she is following up regularly with her podiatrist regarding the management of this, Dr. Jacqualyn Posey.  She is requesting that I prescribe estradiol.  She has been on estradiol 1 mg p.o. daily for hormone replacement therapy.  Past medical history is significant for hysterectomy secondary to uterine prolapse.  Therefore she does not require progesterone.  This was not done for cancerous reasons.  Her symptoms include hot flashes, mood swings, vaginal dryness.  She states that without her hormone replacement therapy, she "goes crazy".  She is miserable without the medication.  Her gynecologist was previously prescribing this however her gynecologist no longer accepts her insurance and she is requesting that I prescribe the medication  Past Medical History:  Diagnosis Date  . Anxiety   . Asthma   . Bipolar 1 disorder ([place])    ect treatments last treatment Sep 02 1011  . Depression   . GERD (gastroesophageal  reflux disease)   . Hypertension   . Pre-diabetes   . Seizures ([place])    last seizure was so long ago she can't remember.   . Sleep apnea    Past Surgical History:  Procedure Laterality Date  . ABDOMINAL HYSTERECTOMY    . BACK SURGERY    . CARPAL TUNNEL RELEASE     x2  . Laproscopic knee surgery    . NECK SURGERY  03/15/2017  . PLANTAR FASCIA RELEASE     x2   Current Outpatient Medications on File Prior to Visit  Medication Sig Dispense Refill  . busPIRone (BUSPAR) 10 MG tablet Take 1 tablet by mouth three times a day For anxiety  2  . Calcium Carbonate-Vit D-Min (CALCIUM 1200 PO) Take 1 tablet by mouth daily.    . citalopram (CELEXA) 40 MG tablet Take 40 mg by mouth at bedtime.     . clindamycin (CLEOCIN) 300 MG capsule Take 1 capsule (300 mg total) by mouth 3 (three) times daily. 30 capsule 0  . clotrimazole (LOTRIMIN) 1 % cream Apply 1 application topically 2 (two) times daily. 113 g 2  . cyclobenzaprine (FLEXERIL) 10 MG tablet Take 10 mg by mouth 2 (two) times daily as needed for muscle spasms.     . Dextran 70-Hypromellose, PF, (ARTIFICIAL TEARS PF) 0.1-0.3 % SOLN Place 1-2 drops into both eyes 3 (three) times daily as needed (for dry eyes).    . EMBEDA 30-1.2 MG CPCR Take 1 capsule by mouth 2 (two) times daily.     . fluticasone (FLONASE) 50 MCG/ACT nasal spray Place 2 sprays into both nostrils daily as needed for allergies. 48 g 1  . gabapentin (NEURONTIN) 600 MG tablet Take 1 tablet (600 mg total) by mouth 3 (three) times daily. For anxiety and pain management. May have to stop to take ECT. (Patient taking differently: Take 600 mg by mouth 4 (four) times daily. For anxiety and pain management) 90 tablet 0  . haloperidol (HALDOL) 5 MG tablet Take 1 tablet (5 mg total) by mouth at bedtime. For psychosis    . hydrochlorothiazide (HYDRODIURIL) 25 MG tablet TAKE ONE TABLET BY MOUTH DAILY 90 tablet 3  . levocetirizine (XYZAL) 5 MG tablet Take 1 tablet (5 mg total) by mouth every  evening. 30 tablet 5  . LINZESS 290 MCG CAPS capsule Take 1 capsule (290 mcg total) by mouth daily before breakfast. 30 capsule 11  . meloxicam (MOBIC) 15 MG tablet Take 15 mg by mouth daily.  2  . metoprolol succinate (TOPROL XL) 25 MG 24 hr tablet Take 0.5 tablets (12.5 mg total) by mouth daily. 15 tablet 6  . Multiple Vitamins-Minerals (MULTIVITAMIN WITH MINERALS) tablet Take 1 tablet by mouth daily.    Marland Kitchen oxyCODONE-acetaminophen (PERCOCET) 10-325 MG tablet Take 1 tablet by mouth 3 (three) times daily.    . pantoprazole (PROTONIX) 40 MG tablet TAKE ONE TABLET BY MOUTH TWICE DAILY 180 tablet 2  . potassium chloride SA (KLOR-CON M20) 20 MEQ tablet Take 2 tablets (40 mEq total) by mouth daily. (Patient taking differently: Take 20 mEq by mouth 2 (two) times daily. ) 180 tablet 3  . predniSONE (DELTASONE) 10 MG tablet Take 1 tablet (10 mg total) by mouth daily.    Marland Kitchen  PROAIR HFA 108 (90 Base) MCG/ACT inhaler INHALE TWO puffs BY MOUTH EVERY 4 HOURS AS NEEDED FOR WHEEZING 8.5 g 1  . promethazine (PHENERGAN) 25 MG tablet Take 1 tablet (25 mg total) by mouth every 6 (six) hours as needed for nausea or vomiting. 10 tablet 0  . topiramate (TOPAMAX) 50 MG tablet TAKE ONE TABLET BY MOUTH TWICE DAILY 60 tablet 11  . traZODone (DESYREL) 100 MG tablet Take 100 mg by mouth at bedtime.  0   No current facility-administered medications on file prior to visit.    Allergies  Allergen Reactions  . Penicillins Hives and Rash    Has patient had a PCN reaction causing immediate rash, facial/tongue/throat swelling, SOB or lightheadedness with hypotension: Yes Has patient had a PCN reaction causing severe rash involving mucus membranes or skin necrosis: No Has patient had a PCN reaction that required hospitalization: No Has patient had a PCN reaction occurring within the last 10 years: No If all of the above answers are "NO", then may proceed with Cephalosporin use.  . Tetracyclines & Related Other (See Comments)     Syncope and put her "in a coma"  . Tramadol Other (See Comments)    Seizures  . Tetracycline Other (See Comments)    Syncope and "Put me in a coma"  . Ciprofloxacin Rash and Itching  . Codeine Itching and Rash  . Estradiol Rash    Patches broke out the skin   Social History   Socioeconomic History  . Marital status: Legally Separated    Spouse name: Not on file  . Number of children: 3  . Years of education: 69  . Highest education level: Not on file  Occupational History  . Occupation: Disabled  Social Needs  . Financial resource strain: Not on file  . Food insecurity:    Worry: Not on file    Inability: Not on file  . Transportation needs:    Medical: Not on file    Non-medical: Not on file  Tobacco Use  . Smoking status: Never Smoker  . Smokeless tobacco: Never Used  Substance and Sexual Activity  . Alcohol use: No  . Drug use: No  . Sexual activity: Not on file  Lifestyle  . Physical activity:    Days per week: Not on file    Minutes per session: Not on file  . Stress: Not on file  Relationships  . Social connections:    Talks on phone: Not on file    Gets together: Not on file    Attends religious service: Not on file    Active member of club or organization: Not on file    Attends meetings of clubs or organizations: Not on file    Relationship status: Not on file  . Intimate partner violence:    Fear of current or ex partner: Not on file    Emotionally abused: Not on file    Physically abused: Not on file    Forced sexual activity: Not on file  Other Topics Concern  . Not on file  Social History Narrative   Patient drink 4 16oz caffeine drinks a day      Review of Systems  All other systems reviewed and are negative.      Objective:   Physical Exam  Cardiovascular: Normal rate, regular rhythm and normal heart sounds.  Pulmonary/Chest: Effort normal and breath sounds normal. No respiratory distress. She has no wheezes.  Abdominal: Soft. Bowel  sounds are normal.  Vitals reviewed.   There is no residual erythema around the left ankle.  Cellulitis has completely resolved.      Assessment & Plan:  Hospital discharge follow-up  Hormone replacement therapy  Cellulitis is completely resolved.  Additional treatment is not necessary.  We had a long discussion today regarding the pros and cons of hormone replacement therapy.  I explained to the patient that hormone replacement therapy does increase her mortality risk.  I explained that there is a very slight risk of breast cancer, DVT/PE, stroke on estrogen replacement therapy.  I also explained that there is benefit such as management of hot flashes, vaginal dryness, prevention of osteoporosis etc.  Patient has weighed the pros and the cons and has elected to proceed with hormone replacement therapy.  She asked me to increase the dose to estradiol and I politely declined.  I would like to keep her on the lowest effective dose and try to wean her off periodically as tolerated.  Therefore I will resume estradiol 1 mg p.o. Daily.  Again, I recommended to the patient that we periodically try to wean her down as tolerated to the lowest effective dose or hopefully off the medication as she ages to reduce her risk of breast cancer, DVT, and stroke

## 2017-10-09 NOTE — Progress Notes (Signed)
Subjective: Cecilia presents the office today for follow-up evaluation of wounds to both of her feet.  Since I last saw her she was in the hospital for cellulitis of the left side she was discharged home with clindamycin.  She only has a couple of days of antibiotics but that she is asking if we should extend this.  Overall the wounds have gotten better since she left the hospital.  She is to remain in surgical shoe with offloading.  She has no other concerns today. Denies any systemic complaints such as fevers, chills, nausea, vomiting. No acute changes since last appointment, and no other complaints at this time.   Objective: AAO x3, NAD DP/PT pulses palpable bilaterally, CRT less than 3 seconds Sensation decreased with Simms Weinstein monofilament. On the plantar aspect of the right hallux there is faint erythema there is no increase in warmth.  No drainage or pus coming from the wound the wound measures about the same size of 0.2 x 0.2 cm.  No fluctuation or crepitation or any malodor.  On the left side continues to be ulceration measuring about 0.7 x 0.5 cm.  Again there is no fluctuation or crepitation.  There is significantly improved erythema on the area he can see the outline of where the cellulitis was present.  There is no drainage or pus there is no fluctuation or crepitation.  There is no malodor.  No other open lesions or pre-ulcerative lesions. Chronic digital deformities are present.  Prominence of metatarsal heads. No pain with calf compression, swelling, warmth, erythema  Assessment: Chronic ulcerations bilaterally with resolving cellulitis left side  Plan: -All treatment options discussed with the patient including all alternatives, risks, complications.  -Continue with kanamycin.  I did refill this today.  Continue antibiotic ointment dressing changes daily.  Continue offloading at all times. These wounds are very difficult given his neuropathy as well as the pressure.  She  declines wound care referral.  Continue the surgical shoe with offloading.  Watch for signs or symptoms of worsening infection go to the ER or call the office and you should any occur.  RTC 1 week or sooner if needed  [name] DPM

## 2017-10-13 ENCOUNTER — Encounter: Payer: Self-pay | Admitting: Podiatry

## 2017-10-13 ENCOUNTER — Ambulatory Visit (INDEPENDENT_AMBULATORY_CARE_PROVIDER_SITE_OTHER): Payer: 59 | Admitting: Podiatry

## 2017-10-13 DIAGNOSIS — L97512 Non-pressure chronic ulcer of other part of right foot with fat layer exposed: Secondary | ICD-10-CM

## 2017-10-13 DIAGNOSIS — L97522 Non-pressure chronic ulcer of other part of left foot with fat layer exposed: Secondary | ICD-10-CM

## 2017-10-14 NOTE — Progress Notes (Signed)
Subjective: [name] [name] presents the office today for follow-up evaluation of wounds to both of her feet. She states that the right side is doing better. She still gets some bloody drainage on the left foot but denies any pus. She has been doing betadine dressing changes daily. She has remained in the surgical shoe with offloading. No redness or red streaks. She has another day of antibiotics. Denies any systemic complaints such as fevers, chills, nausea, vomiting. No acute changes since last appointment, and no other complaints at this time.   Objective: AAO x3, NAD DP/PT pulses palpable bilaterally, CRT less than 3 seconds Sensation decreased with Simms Weinstein monofilament. On the plantar aspect of the right hallux there is a superficial pinpoint granular wound present with no significant edema, erythema, increase in warmth there is no drainage or pus identified in this area.  Overall the right side is doing better.  On the left side she does continue to ulceration submetatarsal 1 with a granular wound base and surrounding hyperkeratotic tissue.  After debridement today the wound does measure smaller 0.6 x 0.4 cm.  There is no probing, undermining or tunneling.  There is no significant surrounding erythema there is no ascending sialitis.  There is no fluctuation or crepitation or any malodor.  No other open lesions or pre-ulcerative lesion identified today.   Assessment: Chronic ulcerations bilaterally with resolving cellulitis left side  Plan: -All treatment options discussed with the patient including all alternatives, risks, complications.  -Sharply debrided wounds to bilateral feet without any complications to remove all nonviable tissue as well as hyperkeratotic tissue.  Continue with Betadine dressing changes daily as it has been helpful.  Finish course of antibiotics but as there is no significant signs of infection today were going to hold off on any further antibiotics but she is to monitor  closely presenting she is to call the office immediately. Remain in offloading shoes.  -Monitor for any clinical signs or symptoms of infection and directed to call the office immediately should any occur or go to the ER.  Return in about 2 weeks (around 10/27/2017).  [name] DPM

## 2017-10-18 ENCOUNTER — Other Ambulatory Visit: Payer: Self-pay | Admitting: Family Medicine

## 2017-10-19 ENCOUNTER — Other Ambulatory Visit: Payer: Self-pay | Admitting: Podiatry

## 2017-10-29 ENCOUNTER — Other Ambulatory Visit: Payer: Self-pay | Admitting: Family Medicine

## 2017-11-01 ENCOUNTER — Encounter: Payer: Self-pay | Admitting: Cardiovascular Disease

## 2017-11-01 MED ORDER — METOPROLOL SUCCINATE ER 25 MG PO TB24: 12.5000 mg | ORAL_TABLET | Freq: Every day | ORAL | 6 refills | Status: DC

## 2017-11-02 ENCOUNTER — Telehealth: Payer: Self-pay | Admitting: Family Medicine

## 2017-11-02 ENCOUNTER — Ambulatory Visit: Payer: 59 | Admitting: Podiatry

## 2017-11-02 MED ORDER — ALBUTEROL SULFATE HFA 108 (90 BASE) MCG/ACT IN AERS: 2.0000 | INHALATION_SPRAY | Freq: Four times a day (QID) | RESPIRATORY_TRACT | 2 refills | Status: DC | PRN

## 2017-11-02 NOTE — Telephone Encounter (Signed)
insurance will not cover Pro-Air - changed to Ventolin - Med sent to pharm

## 2017-11-03 ENCOUNTER — Emergency Department: Payer: 59

## 2017-11-03 ENCOUNTER — Encounter: Payer: Self-pay | Admitting: *Deleted

## 2017-11-03 ENCOUNTER — Other Ambulatory Visit: Payer: Self-pay

## 2017-11-03 ENCOUNTER — Emergency Department
Admission: EM | Admit: 2017-11-03 | Discharge: 2017-11-03 | Disposition: A | Discharge status: Home / Self Care | Payer: 59 | Attending: Student in an Organized Health Care Education/Training Program | Admitting: Student in an Organized Health Care Education/Training Program

## 2017-11-03 DIAGNOSIS — Y939 Activity, unspecified: Secondary | ICD-10-CM | POA: Insufficient documentation

## 2017-11-03 DIAGNOSIS — Y92009 Unspecified place in unspecified non-institutional (private) residence as the place of occurrence of the external cause: Secondary | ICD-10-CM | POA: Diagnosis not present

## 2017-11-03 DIAGNOSIS — S60221A Contusion of right hand, initial encounter: Secondary | ICD-10-CM | POA: Diagnosis not present

## 2017-11-03 DIAGNOSIS — Z79899 Other long term (current) drug therapy: Secondary | ICD-10-CM | POA: Diagnosis not present

## 2017-11-03 DIAGNOSIS — W228XXA Striking against or struck by other objects, initial encounter: Secondary | ICD-10-CM | POA: Insufficient documentation

## 2017-11-03 DIAGNOSIS — J45909 Unspecified asthma, uncomplicated: Secondary | ICD-10-CM | POA: Diagnosis not present

## 2017-11-03 DIAGNOSIS — I10 Essential (primary) hypertension: Secondary | ICD-10-CM | POA: Diagnosis not present

## 2017-11-03 DIAGNOSIS — Y999 Unspecified external cause status: Secondary | ICD-10-CM | POA: Diagnosis not present

## 2017-11-03 DIAGNOSIS — S6981XA Other specified injuries of right wrist, hand and finger(s), initial encounter: Secondary | ICD-10-CM | POA: Diagnosis present

## 2017-11-03 NOTE — ED Triage Notes (Signed)
Pt reporting right sided hand pain that started after having hit her hand today on the counter. Movement decreased due to swelling but sensation intact. Bruising noted around knuckles.

## 2017-11-03 NOTE — ED Provider Notes (Signed)
[facility] Emergency Department Provider Note  ____________________________________________  Time seen: Approximately 9:12 PM  I have reviewed the triage vital signs and the nursing notes.   HISTORY  Chief Complaint Hand Pain    HPI [name] [name] is a 52 y.o. female presents to the emergency department with right hand pain after patient reports that she struck her hand against a pillar along a ramp at her house.  Patient has noticed swelling along the dorsal aspect of the right hand.  Patient reports her pain is 8 out of 10 in intensity and aching.  Patient reports that pain is worse when she makes a fist and relieved with rest.  Patient is under pain management.   Past Medical History:  Diagnosis Date  . Anxiety   . Asthma   . Bipolar 1 disorder ([place])    ect treatments last treatment Sep 02 1011  . Depression   . GERD (gastroesophageal reflux disease)   . Hypertension   . Pre-diabetes   . Seizures ([place])    last seizure was so long ago she can't remember.   . Sleep apnea     Patient Active Problem List   Diagnosis Date Noted  . Left leg cellulitis 09/24/2017  . Cellulitis 09/24/2017  . Syncope and collapse   . Near syncope 11/23/2016  . Abdominal pain 09/23/2016  . Anxiety 09/23/2016  . 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[place]) 09/23/2016  . Constipation 09/23/2016  . DDD (degenerative disc disease), lumbosacral 09/23/2016  . Hypertension 09/23/2016  . Migraines 09/23/2016  . Myelopathy ([place]) 09/23/2016  . Nausea 09/23/2016  . Post laminectomy syndrome 09/23/2016  . Pseudoarthrosis of lumbar spine 09/23/2016  . Seizures ([place]) 09/23/2016  . Shortness of breath 09/23/2016  . Sleep apnea 09/23/2016  . Spinal stenosis of lumbar region 09/23/2016  . Vertigo 09/23/2016  . 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 Chronic pain syndrome   . Esophageal reflux   . Depression   . Rash 11/14/2015  . Cellulitis of great toe of right foot 08/12/2015  . Toe  ulcer, right ([place]) 04/11/2015  . Cellulitis of toe of right foot 04/11/2015  . Nonspecific chest pain   . Chest pain 10/26/2014  . Obesity 10/26/2014  . Hypokalemia 10/26/2014  . Manic bipolar I disorder ([place]) 10/26/2014  . Atypical angina ([place]) 10/26/2014  . Tardive dyskinesia 08/26/2011  . Manic bipolar I disorder with rapid cycling ([place]) 05/18/2011    Class: Acute  . Asthma 02/24/2011  . Chronic pain 02/24/2011  . Essential hypertension 02/24/2011  . History of migraine headaches 02/24/2011    Past Surgical History:  Procedure Laterality Date  . ABDOMINAL HYSTERECTOMY    . BACK SURGERY    . CARPAL TUNNEL RELEASE     x2  . Laproscopic knee surgery    . NECK SURGERY  03/15/2017  . PLANTAR FASCIA RELEASE     x2    Prior to Admission medications   Medication Sig Start Date End Date Taking? Authorizing Provider  albuterol (PROVENTIL HFA;VENTOLIN HFA) 108 (90 Base) MCG/ACT inhaler Inhale 2 puffs into the lungs every 6 (six) hours as needed for wheezing or shortness of breath. 11/02/17   [name], MD  busPIRone (BUSPAR) 10 MG tablet Take 1 tablet by mouth three times a day For anxiety 04/26/17   [provider]  Calcium Carbonate-Vit D-Min (CALCIUM 1200 PO) Take 1 tablet by mouth daily.    [provider]  citalopram (CELEXA) 40 MG tablet Take  40 mg by mouth at bedtime.  05/28/11   Darrol Jump, MD  clindamycin (CLEOCIN) 300 MG capsule Take 1 capsule (300 mg total) by mouth 3 (three) times daily. 10/03/17   [name], DPM  clotrimazole (LOTRIMIN) 1 % cream Apply 1 application topically 2 (two) times daily. 04/21/17   Shelly Bombard, MD  cyclobenzaprine (FLEXERIL) 10 MG tablet Take 10 mg by mouth 2 (two) times daily as needed for muscle spasms.     [provider]  Dextran 70-Hypromellose, PF, (ARTIFICIAL TEARS PF) 0.1-0.3 % SOLN Place 1-2 drops into both eyes 3 (three) times daily as needed (for dry eyes).    [provider]   EMBEDA 30-1.2 MG CPCR Take 1 capsule by mouth 2 (two) times daily.  10/19/16   [provider]  estradiol (ESTRACE) 1 MG tablet Take 1 tablet (1 mg total) by mouth daily. 10/06/17   [name], MD  fluticasone (FLONASE) 50 MCG/ACT nasal spray Place 2 sprays into both nostrils daily as needed for allergies. 09/29/17   [name], MD  gabapentin (NEURONTIN) 600 MG tablet Take 1 tablet (600 mg total) by mouth 3 (three) times daily. For anxiety and pain management. May have to stop to take ECT. Patient taking differently: Take 600 mg by mouth 4 (four) times daily. For anxiety and pain management 05/28/11   Darrol Jump, MD  haloperidol (HALDOL) 5 MG tablet Take 1 tablet (5 mg total) by mouth at bedtime. For psychosis 11/15/15   Barton Dubois, MD  hydrochlorothiazide (HYDRODIURIL) 25 MG tablet TAKE ONE TABLET BY MOUTH DAILY 01/11/17   [name], MD  levocetirizine (XYZAL) 5 MG tablet Take 1 tablet (5 mg total) by mouth every evening. 06/20/17   [name], MD  LINZESS 290 MCG CAPS capsule Take 1 capsule (290 mcg total) by mouth daily before breakfast. 01/11/17   [name], MD  lubiprostone (AMITIZA) 24 MCG capsule Take 1 capsule (24 mcg total) by mouth 2 (two) times daily. TAKE 1 CAPSULE TWICE A DAY WITH A MEAL 10/18/17   [name], MD  meloxicam (MOBIC) 15 MG tablet Take 15 mg by mouth daily. 09/15/17   [provider]  metoprolol succinate (TOPROL XL) 25 MG 24 hr tablet Take 0.5 tablets (12.5 mg total) by mouth daily. 11/01/17   [name], MD  Multiple Vitamins-Minerals (MULTIVITAMIN WITH MINERALS) tablet Take 1 tablet by mouth daily.    [provider]  oxyCODONE-acetaminophen (PERCOCET) 10-325 MG tablet Take 1 tablet by mouth 3 (three) times daily. 12/14/16   [provider]  pantoprazole (PROTONIX) 40 MG tablet TAKE ONE TABLET BY MOUTH TWICE DAILY 08/08/17   [name], MD  potassium chloride SA (KLOR-CON M20) 20 MEQ  tablet Take 2 tablets (40 mEq total) by mouth daily. Patient taking differently: Take 20 mEq by mouth 2 (two) times daily.  11/02/16   [name], MD  predniSONE (DELTASONE) 10 MG tablet Take 1 tablet (10 mg total) by mouth daily. 09/26/17   Donne Hazel, MD  promethazine (PHENERGAN) 25 MG tablet Take 1 tablet (25 mg total) by mouth every 6 (six) hours as needed for nausea or vomiting. 06/26/17   Wynona Luna, MD  topiramate (TOPAMAX) 50 MG tablet TAKE ONE TABLET BY MOUTH TWICE DAILY 01/11/17   [name], MD  traZODone (DESYREL) 100 MG tablet Take 100 mg by mouth at bedtime. 07/29/14   [provider]  Allergies Penicillins; Tetracyclines & related; Tramadol; Tetracycline; Ciprofloxacin; Codeine; and Estradiol  Family History  Problem Relation Age of Onset  . Diabetes Mother   . COPD Mother   . Hypertension Mother   . Hyperlipidemia Mother   . Heart disease Father   . Hyperlipidemia Father   . Hypertension Father   . Cancer Father   . Heart disease Brother   . Heart disease Daughter   . Heart disease Maternal Grandmother   . Hypertension Maternal Grandmother   . Heart disease Maternal Grandfather   . Kidney cancer Paternal Grandmother   . Heart disease Paternal Grandfather     Social History Social History   Tobacco Use  . Smoking status: Never Smoker  . Smokeless tobacco: Never Used  Substance Use Topics  . Alcohol use: No  . Drug use: No     Review of Systems  Constitutional: No fever/chills Eyes: No visual changes. No discharge ENT: No upper respiratory complaints. Cardiovascular: no chest pain. Respiratory: no cough. No SOB. Gastrointestinal: No abdominal pain.  No nausea, no vomiting.  No diarrhea.  No constipation. Musculoskeletal: Patient has right hand pain.  Skin: Negative for rash, abrasions, lacerations, ecchymosis. Neurological: Negative for headaches, focal weakness or  numbness.   ____________________________________________   PHYSICAL EXAM:  VITAL SIGNS: ED Triage Vitals  Enc Vitals Group     BP 11/03/17 1851 129/62     Pulse Rate 11/03/17 1851 88     Resp 11/03/17 1851 16     Temp 11/03/17 1851 98.6 F (37 C)     Temp Source 11/03/17 1851 Oral     SpO2 11/03/17 1851 96 %     Weight 11/03/17 1852 234 lb (106.1 kg)     Height 11/03/17 1852 5\' 4"  (1.626 m)     Head Circumference --      Peak Flow --      Pain Score 11/03/17 1852 8     Pain Loc --      Pain Edu? --      Excl. in [place]? --      Constitutional: Alert and oriented. Well appearing and in no acute distress. Eyes: Conjunctivae are normal. PERRL. EOMI. Head: Atraumatic. Cardiovascular: Normal rate, regular rhythm. Normal S1 and S2.  Good peripheral circulation. Respiratory: Normal respiratory effort without tachypnea or retractions. Lungs CTAB. Good air entry to the bases with no decreased or absent breath sounds. Musculoskeletal: Patient is able to perform full range of motion at the right wrist.  She is able to move all 5 right fingers.  Patient can perform resisted flexion and extension at all 5 digits.  Palpable edema is appreciated over the dorsal aspect of the right hand.  Palpable radial pulse, right. Neurologic:  Normal speech and language. No gross focal neurologic deficits are appreciated.  Skin:  Skin is warm, dry and intact. No rash noted. Psychiatric: Mood and affect are normal. Speech and behavior are normal. Patient exhibits appropriate insight and judgement.   ____________________________________________   LABS (all labs ordered are listed, but only abnormal results are displayed)  Labs Reviewed - No data to display ____________________________________________  EKG   ____________________________________________  RADIOLOGY I personally viewed and evaluated these images as part of my medical decision making, as well as reviewing the written report by the  radiologist.  Dg Hand Complete Right  Result Date: 11/03/2017 CLINICAL DATA:  Right hand pain, hit hand on counter. EXAM: RIGHT HAND - COMPLETE 3+ VIEW COMPARISON:  None. FINDINGS: Soft  tissue swelling along the dorsum of the hand. No acute bony abnormality. Specifically, no fracture, subluxation, or dislocation. IMPRESSION: No acute bony abnormality. Electronically Signed   By: Rolm Baptise M.D.   On: 11/03/2017 19:19    ____________________________________________    PROCEDURES  Procedure(s) performed:    Procedures    Medications - No data to display   ____________________________________________   INITIAL IMPRESSION / ASSESSMENT AND PLAN / ED COURSE  Pertinent labs & imaging results that were available during my care of the patient were reviewed by me and considered in my medical decision making (see chart for details).  Review of the [place] CSRS was performed in accordance of the [place] prior to dispensing any controlled drugs.      Assessment and plan Right hand pain Patient presents to the emergency department with right hand pain.  X-ray examination reveals no acute fractures or bony abnormalities.  On physical exam, patient had edema overlying the dorsal aspect of the right hand.   patient was splinted in the emergency department.  Patient reports that she has meloxicam at home.  She was referred to Dr. Peggye Ley.  All patient questions were answered.    ____________________________________________  FINAL CLINICAL IMPRESSION(S) / ED DIAGNOSES  Final diagnoses:  Contusion of right hand, initial encounter      NEW MEDICATIONS STARTED DURING THIS VISIT:  ED Discharge Orders    None          This chart was dictated using voice recognition software/Dragon. Despite best efforts to proofread, errors can occur which can change the meaning. Any change was purely unintentional.    Karren Cobble 11/03/17 2117    Merlyn Lot, MD 11/03/17 2154

## 2017-11-04 ENCOUNTER — Other Ambulatory Visit: Payer: Self-pay | Admitting: Family Medicine

## 2017-11-08 ENCOUNTER — Ambulatory Visit (INDEPENDENT_AMBULATORY_CARE_PROVIDER_SITE_OTHER): Payer: 59 | Admitting: Podiatry

## 2017-11-08 DIAGNOSIS — L97511 Non-pressure chronic ulcer of other part of right foot limited to breakdown of skin: Secondary | ICD-10-CM

## 2017-11-08 DIAGNOSIS — L97521 Non-pressure chronic ulcer of other part of left foot limited to breakdown of skin: Secondary | ICD-10-CM | POA: Diagnosis not present

## 2017-11-08 NOTE — Progress Notes (Signed)
Subjective: [name] [name] presents the office today for follow-up evaluation of wounds to both of her feet. She has been keeping betadine on the wounds daily and she feels that they are doing much better.  She denies any drainage or pus coming from the area she denies any redness or swelling.  She has no other concerns today. Denies any systemic complaints such as fevers, chills, nausea, vomiting. No acute changes since last appointment.  Objective: AAO x3, NAD DP/PT pulses palpable bilaterally, CRT less than 3 seconds Sensation decreased with Simms Weinstein monofilament. On the plantar aspect of the right hallux as well as left foot the metatarsal one appears in the winter almost completely healed at this time small very superficial fissure type lesions are present.. No drainage or pus there is no surrounding erythema, ascending cellulitis.  Is no fluctuation or crepitation or any malodor.  Overall the wounds are doing significantly better. No other open lesions or pre-ulcerative lesion identified today.         Assessment: Chronic ulcerations bilaterally with much improvement.   Plan: -All treatment options discussed with the patient including all alternatives, risks, complications.  -I did lightly debride the hyperkeratotic tissue to the any complications of bleeding.  The wounds are doing much better.  I want her to continue the Betadine and Band-Aids to the area daily.  Over the next week she can slowly start to transition to regular shoe because she has had some issues with falls.  Monitor closely for any further discomfort or any worsening wounds to return to the surgical she will let me know should there be any issues. Monitor for any clinical signs or symptoms of infection and directed to call the office immediately should any occur or go to the ER.  [name] DPM

## 2017-11-13 ENCOUNTER — Encounter: Payer: Self-pay | Admitting: Family Medicine

## 2017-11-14 ENCOUNTER — Ambulatory Visit (INDEPENDENT_AMBULATORY_CARE_PROVIDER_SITE_OTHER): Payer: 59 | Admitting: Family Medicine

## 2017-11-14 ENCOUNTER — Encounter: Payer: Self-pay | Admitting: Family Medicine

## 2017-11-14 VITALS — BP 130/86 | HR 84 | Temp 97.7°F | Resp 18 | Ht 63.5 in | Wt 230.0 lb

## 2017-11-14 DIAGNOSIS — N390 Urinary tract infection, site not specified: Secondary | ICD-10-CM | POA: Diagnosis not present

## 2017-11-14 DIAGNOSIS — R300 Dysuria: Secondary | ICD-10-CM

## 2017-11-14 LAB — URINALYSIS, ROUTINE W REFLEX MICROSCOPIC
Bilirubin Urine: NEGATIVE
Glucose, UA: NEGATIVE
Hyaline Cast: NONE SEEN /LPF
Ketones, ur: NEGATIVE
Nitrite: NEGATIVE
Protein, ur: NEGATIVE
Specific Gravity, Urine: 1.02 (ref 1.001–1.03)
pH: 7 (ref 5.0–8.0)

## 2017-11-14 LAB — MICROSCOPIC MESSAGE

## 2017-11-14 MED ORDER — PROMETHAZINE HCL 25 MG PO TABS: 25.0000 mg | ORAL_TABLET | Freq: Four times a day (QID) | ORAL | 0 refills | Status: DC | PRN

## 2017-11-14 MED ORDER — SULFAMETHOXAZOLE-TRIMETHOPRIM 800-160 MG PO TABS: 1.0000 | ORAL_TABLET | Freq: Two times a day (BID) | ORAL | 0 refills | Status: DC

## 2017-11-14 NOTE — Progress Notes (Signed)
Subjective:    Patient ID: [name] [name], female    DOB: [birth year], 52 y.o.   MRN: [id number]  HPI  Patient states her symptoms began Saturday.  She reports pelvic pressure, dysuria, frequency, urgency.  She denies any fevers or chills.  She does report some mild nausea and is requesting something for nausea in addition to treatment for what she suspects is a urinary tract infection.  Urinalysis shows trace amount of blood as well as leukocyte esterase. Past Medical History:  Diagnosis Date  . Anxiety   . Asthma   . Bipolar 1 disorder ([place])    ect treatments last treatment Sep 02 1011  . Depression   . GERD (gastroesophageal reflux disease)   . Hypertension   . Pre-diabetes   . Seizures ([place])    last seizure was so long ago she can't remember.   . Sleep apnea    Past Surgical History:  Procedure Laterality Date  . ABDOMINAL HYSTERECTOMY    . BACK SURGERY    . CARPAL TUNNEL RELEASE     x2  . Laproscopic knee surgery    . NECK SURGERY  03/15/2017  . PLANTAR FASCIA RELEASE     x2   Current Outpatient Medications on File Prior to Visit  Medication Sig Dispense Refill  . albuterol (PROVENTIL HFA;VENTOLIN HFA) 108 (90 Base) MCG/ACT inhaler Inhale 2 puffs into the lungs every 6 (six) hours as needed for wheezing or shortness of breath. 1 Inhaler 2  . busPIRone (BUSPAR) 10 MG tablet Take 1 tablet by mouth three times a day For anxiety  2  . Calcium Carbonate-Vit D-Min (CALCIUM 1200 PO) Take 1 tablet by mouth daily.    . citalopram (CELEXA) 40 MG tablet Take 40 mg by mouth at bedtime.     . clotrimazole (LOTRIMIN) 1 % cream Apply 1 application topically 2 (two) times daily. 113 g 2  . cyclobenzaprine (FLEXERIL) 10 MG tablet Take 10 mg by mouth 2 (two) times daily as needed for muscle spasms.     . Dextran 70-Hypromellose, PF, (ARTIFICIAL TEARS PF) 0.1-0.3 % SOLN Place 1-2 drops into both eyes 3 (three) times daily as needed (for dry eyes).    . EMBEDA 30-1.2 MG CPCR Take 1  capsule by mouth 2 (two) times daily.     Marland Kitchen estradiol (ESTRACE) 1 MG tablet Take 1 tablet (1 mg total) by mouth daily. 90 tablet 3  . fluticasone (FLONASE) 50 MCG/ACT nasal spray Place 2 sprays into both nostrils daily as needed for allergies. 48 g 1  . gabapentin (NEURONTIN) 600 MG tablet Take 1 tablet (600 mg total) by mouth 3 (three) times daily. For anxiety and pain management. May have to stop to take ECT. (Patient taking differently: Take 600 mg by mouth 4 (four) times daily. For anxiety and pain management) 90 tablet 0  . haloperidol (HALDOL) 5 MG tablet Take 1 tablet (5 mg total) by mouth at bedtime. For psychosis    . hydrochlorothiazide (HYDRODIURIL) 25 MG tablet TAKE ONE TABLET BY MOUTH DAILY 90 tablet 3  . levocetirizine (XYZAL) 5 MG tablet TAKE ONE TABLET BY MOUTH EVERY EVENING 30 tablet 5  . LINZESS 290 MCG CAPS capsule Take 1 capsule (290 mcg total) by mouth daily before breakfast. 30 capsule 11  . lubiprostone (AMITIZA) 24 MCG capsule Take 1 capsule (24 mcg total) by mouth 2 (two) times daily. TAKE 1 CAPSULE TWICE A DAY WITH A MEAL 30 capsule 3  . meloxicam (MOBIC)  15 MG tablet Take 15 mg by mouth daily.  2  . metoprolol succinate (TOPROL XL) 25 MG 24 hr tablet Take 0.5 tablets (12.5 mg total) by mouth daily. 15 tablet 6  . Multiple Vitamins-Minerals (MULTIVITAMIN WITH MINERALS) tablet Take 1 tablet by mouth daily.    Marland Kitchen oxyCODONE-acetaminophen (PERCOCET) 10-325 MG tablet Take 1 tablet by mouth 3 (three) times daily.    . pantoprazole (PROTONIX) 40 MG tablet TAKE ONE TABLET BY MOUTH TWICE DAILY 180 tablet 2  . potassium chloride SA (KLOR-CON M20) 20 MEQ tablet Take 2 tablets (40 mEq total) by mouth daily. (Patient taking differently: Take 20 mEq by mouth 2 (two) times daily. ) 180 tablet 3  . predniSONE (DELTASONE) 10 MG tablet Take 1 tablet (10 mg total) by mouth daily.    Marland Kitchen topiramate (TOPAMAX) 50 MG tablet TAKE ONE TABLET BY MOUTH TWICE DAILY 60 tablet 11  . traZODone (DESYREL) 100  MG tablet Take 100 mg by mouth at bedtime.  0   No current facility-administered medications on file prior to visit.    Allergies  Allergen Reactions  . Penicillins Hives and Rash    Has patient had a PCN reaction causing immediate rash, facial/tongue/throat swelling, SOB or lightheadedness with hypotension: Yes Has patient had a PCN reaction causing severe rash involving mucus membranes or skin necrosis: No Has patient had a PCN reaction that required hospitalization: No Has patient had a PCN reaction occurring within the last 10 years: No If all of the above answers are "NO", then may proceed with Cephalosporin use.  . Tetracyclines & Related Other (See Comments)    Syncope and put her "in a coma"  . Tramadol Other (See Comments)    Seizures  . Tetracycline Other (See Comments)    Syncope and "Put me in a coma"  . Ciprofloxacin Rash and Itching  . Codeine Itching and Rash  . Estradiol Rash    Patches broke out the skin   Social History   Socioeconomic History  . Marital status: Legally Separated    Spouse name: Not on file  . Number of children: 3  . Years of education: 21  . Highest education level: Not on file  Occupational History  . Occupation: Disabled  Social Needs  . Financial resource strain: Not on file  . Food insecurity:    Worry: Not on file    Inability: Not on file  . Transportation needs:    Medical: Not on file    Non-medical: Not on file  Tobacco Use  . Smoking status: Never Smoker  . Smokeless tobacco: Never Used  Substance and Sexual Activity  . Alcohol use: No  . Drug use: No  . Sexual activity: Not on file  Lifestyle  . Physical activity:    Days per week: Not on file    Minutes per session: Not on file  . Stress: Not on file  Relationships  . Social connections:    Talks on phone: Not on file    Gets together: Not on file    Attends religious service: Not on file    Active member of club or organization: Not on file    Attends  meetings of clubs or organizations: Not on file    Relationship status: Not on file  . Intimate partner violence:    Fear of current or ex partner: Not on file    Emotionally abused: Not on file    Physically abused: Not on file  Forced sexual activity: Not on file  Other Topics Concern  . Not on file  Social History Narrative   Patient drink 4 16oz caffeine drinks a day      Review of Systems  All other systems reviewed and are negative.      Objective:   Physical Exam  Cardiovascular: Normal rate, regular rhythm and normal heart sounds.  Pulmonary/Chest: Effort normal and breath sounds normal. No respiratory distress. She has no wheezes.  Abdominal: Soft. Bowel sounds are normal.  Vitals reviewed.         Assessment & Plan:  Burning with urination - Plan: Urinalysis, Routine w reflex microscopic  Urinary tract infection without hematuria, site unspecified Use Bactrim double strength tablets 1 p.o. twice daily for 3 days dispense quantity sufficient.  Push fluids.  She can use Phenergan 25 mg every 6 hours as needed for nausea.  Recheck in 3 days if no better or sooner if worse.  There was insufficient urine quantity to send a urine culture

## 2017-11-28 ENCOUNTER — Encounter: Payer: Self-pay | Admitting: Family Medicine

## 2017-11-29 ENCOUNTER — Ambulatory Visit (INDEPENDENT_AMBULATORY_CARE_PROVIDER_SITE_OTHER): Payer: 59 | Admitting: Podiatry

## 2017-11-29 ENCOUNTER — Other Ambulatory Visit: Payer: Self-pay | Admitting: Family Medicine

## 2017-11-29 DIAGNOSIS — L97521 Non-pressure chronic ulcer of other part of left foot limited to breakdown of skin: Secondary | ICD-10-CM

## 2017-11-29 DIAGNOSIS — G629 Polyneuropathy, unspecified: Secondary | ICD-10-CM | POA: Diagnosis not present

## 2017-11-29 DIAGNOSIS — L97512 Non-pressure chronic ulcer of other part of right foot with fat layer exposed: Secondary | ICD-10-CM | POA: Diagnosis not present

## 2017-11-29 MED ORDER — NITROFURANTOIN MONOHYD MACRO 100 MG PO CAPS: 100.0000 mg | ORAL_CAPSULE | Freq: Two times a day (BID) | ORAL | 0 refills | Status: DC

## 2017-12-02 NOTE — Progress Notes (Signed)
Subjective: 52 year old female presents the office today for follow-up evaluation of wounds to both of her feet.  She says the right side is done very well and she is back to regular shoe.  The left side was doing well but when she got back into regular shoes she started putting pressure to the area and the wound started to open back up all the minimally.  Denies any drainage or pus or swelling or redness.  She has been continue with daily dressing changes.  She has no other concerns today. Denies any systemic complaints such as fevers, chills, nausea, vomiting. No acute changes since last appointment, and no other complaints at this time.   Objective: AAO x3, NAD DP/PT pulses palpable bilaterally, CRT less than 3 seconds On the right foot on the hallux there is no edema, erythema and the wound appears to be completely healed at this time and there is no other ulcerations in the right foot.  On the left foot submetatarsal one is to be a small superficial granular wound measuring approximately 0.2 x 0.2 cm and is superficial without any probing, undermining or tunneling.  There is no drainage or pus there is no swelling erythema, ascending cellulitis.  There is no fluctuation or crepitation or malodor. No open lesions or pre-ulcerative lesions.  No pain with calf compression, swelling, warmth, erythema  Assessment: Ulceration left foot, without signs of infection  Plan: -All treatment options discussed with the patient including all alternatives, risks, complications.  -Wound appears to be healthy without signs of infection of the left foot.  Continue daily dressing changes.  I did lightly debride the area today although there is not much hyperkeratotic tissue.  We will check with insurance to try to get orthotics made for her.  She is an accommodative type insert to help offload submetatarsal on the left foot as well as the hallux on the right foot. I will have her see Liliane Channel -Monitor for any clinical  signs or symptoms of infection and directed to call the office immediately should any occur or go to the ER. -Patient encouraged to call the office with any questions, concerns, change in symptoms.   [name] DPM

## 2017-12-06 ENCOUNTER — Encounter: Payer: Self-pay | Admitting: Family Medicine

## 2017-12-07 ENCOUNTER — Ambulatory Visit (INDEPENDENT_AMBULATORY_CARE_PROVIDER_SITE_OTHER): Payer: 59 | Admitting: Family Medicine

## 2017-12-07 ENCOUNTER — Encounter: Payer: Self-pay | Admitting: Family Medicine

## 2017-12-07 VITALS — BP 122/74 | HR 73 | Temp 98.3°F | Resp 17 | Wt 234.0 lb

## 2017-12-07 DIAGNOSIS — L03011 Cellulitis of right finger: Secondary | ICD-10-CM

## 2017-12-07 DIAGNOSIS — S61001A Unspecified open wound of right thumb without damage to nail, initial encounter: Secondary | ICD-10-CM | POA: Diagnosis not present

## 2017-12-07 NOTE — Patient Instructions (Signed)
Go to [facility] through their Urgent care - I have contacted Dr. Burnett Kanaris office and MA and they advise going there for eval and care of wound and removal and replacement of cast as needed.

## 2017-12-07 NOTE — Progress Notes (Signed)
Patient ID: [name] [name], female    DOB: 02-09-1966, 52 y.o.   MRN: [id number]  PCP: [name], MD  Chief Complaint  Patient presents with  . Abrasion    Subjective:   [name] [name] is a 52 y.o. female, presents to clinic with CC of right thumb skin wound associated with right ulnargutter cast that has been on for two weeks to tx metacarpal fx. she got injured several weeks ago, states that she was in a splint for about 2 weeks and then 2 weeks ago she was put in a fiberglass cast.  She states that it was red and rubbing for several days and then had blistering and then skin began to appear eroded.  She has been trying to cover with Band-Aids but they are difficult to position in the area and continue to fall off.  She has had some clear drainage and some fluids get on the padding of the cast and soaked Band-Aids but she has not seen any purulent drainage.  The area around the wound has become very tender, red and swollen.  She has moderate to severe pain, much worse with any movement or palpation, pain is been constant and gradually worsening.  Denies any fever, numbness, tingling.  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  Recent recurrent UTI, but pt denies DM, immunocompromise or other recurrent skin infection.  Patient Active Problem List   Diagnosis Date Noted  . Left leg cellulitis 09/24/2017  . Cellulitis 09/24/2017  . Syncope and collapse   . Near syncope 11/23/2016  . Abdominal pain 09/23/2016  . Anxiety 09/23/2016  . 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 Constipation 09/23/2016  . DDD (degenerative disc disease), lumbosacral 09/23/2016  . Hypertension 09/23/2016  . Migraines 09/23/2016  . Myelopathy ([place]) 09/23/2016  . Nausea 09/23/2016  . Post laminectomy syndrome 09/23/2016  . Pseudoarthrosis of lumbar spine 09/23/2016  . Seizures ([place]) 09/23/2016  . Shortness of breath 09/23/2016  . Sleep apnea  09/23/2016  . Spinal stenosis of lumbar region 09/23/2016  . Vertigo 09/23/2016  . 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 Chronic pain syndrome   . Esophageal reflux   . Depression   . Rash 11/14/2015  . Cellulitis of great toe of right foot 08/12/2015  . Toe ulcer, right ([place]) 04/11/2015  . Cellulitis of toe of right foot 04/11/2015  . Nonspecific chest pain   . Chest pain 10/26/2014  . Obesity 10/26/2014  . Hypokalemia 10/26/2014  . 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) 10/26/2014  . Atypical angina ([place]) 10/26/2014  . Tardive dyskinesia 08/26/2011  . 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 Asthma 02/24/2011  . Chronic pain 02/24/2011  . Essential hypertension 02/24/2011  . History of migraine headaches 02/24/2011     Allergies  Allergen Reactions  . Penicillins Hives and Rash    Has patient had a PCN reaction causing immediate rash, facial/tongue/throat swelling, SOB or lightheadedness with hypotension: Yes Has patient had a PCN reaction causing severe rash involving mucus membranes or skin necrosis: No Has patient had a PCN reaction that required hospitalization: No Has patient had a PCN reaction occurring within the last 10 years: No If all of the above answers are "NO", then may proceed with Cephalosporin use.  . Tetracyclines & Related Other (See Comments)    Syncope and put her "in a coma"  . Tramadol Other (  See Comments)    Seizures  . Tetracycline Other (See Comments)    Syncope and "Put me in a coma"  . Ciprofloxacin Rash and Itching  . Codeine Itching and Rash  . Estradiol Rash    Patches broke out the skin      Review of Systems  Constitutional: Negative.   HENT: Negative.   Eyes: Negative.   Respiratory: Negative.   Cardiovascular: Negative.   Gastrointestinal: Negative.   Endocrine: Negative.   Genitourinary: Negative.   Musculoskeletal: Negative.   Skin: Negative.   Allergic/Immunologic: Negative.   Neurological:  Negative.   Hematological: Negative.   Psychiatric/Behavioral: Negative.   All other systems reviewed and are negative.      Objective:    Vitals:   12/07/17 1443  BP: 122/74  Pulse: 73  Resp: 17  Temp: 98.3 F (36.8 C)  TempSrc: Oral  SpO2: 97%  Weight: 234 lb (106.1 kg)      Physical Exam  Constitutional: She appears well-developed.  HENT:  Head: Normocephalic and atraumatic.  Nose: Nose normal.  Eyes: Conjunctivae are normal. Right eye exhibits no discharge. Left eye exhibits no discharge.  Neck: No tracheal deviation present.  Cardiovascular: Normal rate and regular rhythm.  Pulmonary/Chest: Effort normal. No stridor. No respiratory distress.  Musculoskeletal: Normal range of motion.  Neurological: She is alert. She exhibits normal muscle tone. Coordination normal.  Skin: Skin is warm. Capillary refill takes less than 2 seconds. No rash noted. There is erythema.  See picture, area of erythema indurated and tender to palpation, no fluctuance  Psychiatric: She has a normal mood and affect. Her behavior is normal.  Nursing note and vitals reviewed.           Assessment & Plan:      ICD-10-CM   1. Open wound of right thumb, initial encounter S61.001A   2. Cellulitis of finger of right hand L03.011     Pt with wound/infection secondary to fiberglass cast pressure or friction on skin, pt with hx of other wounds, however not immunocompromised.  I called her orthopedic office, spoke with Danae Chen at [facility] and talked Dr. Cherylann Parr MA, they are aware of her situation and advised her to come directly to the [facility] urgent care.  They stated they will eval, are able to get cast on/off, and tx wound/infection, so will not start abx from here.  Wound dressed with xeroform and non-adherent gauze secured with paper tape.    Plan reviewed with pt who agrees. Delsa Grana, PA-C 12/07/17 2:59 PM

## 2017-12-08 ENCOUNTER — Telehealth: Payer: Self-pay | Admitting: Podiatry

## 2017-12-08 NOTE — Telephone Encounter (Signed)
Pt returned call about orthotics coverage and is aware medicare does not cover them and is going to discuss further or other options at her next appt with you

## 2017-12-13 ENCOUNTER — Ambulatory Visit: Payer: 59 | Admitting: Podiatry

## 2017-12-15 ENCOUNTER — Ambulatory Visit (INDEPENDENT_AMBULATORY_CARE_PROVIDER_SITE_OTHER): Payer: 59 | Admitting: Podiatry

## 2017-12-15 ENCOUNTER — Encounter: Payer: Self-pay | Admitting: Podiatry

## 2017-12-15 DIAGNOSIS — S91209A Unspecified open wound of unspecified toe(s) with damage to nail, initial encounter: Secondary | ICD-10-CM

## 2017-12-15 DIAGNOSIS — L97521 Non-pressure chronic ulcer of other part of left foot limited to breakdown of skin: Secondary | ICD-10-CM

## 2017-12-15 DIAGNOSIS — L97512 Non-pressure chronic ulcer of other part of right foot with fat layer exposed: Secondary | ICD-10-CM

## 2017-12-15 NOTE — Progress Notes (Signed)
Subjective: 52 year old female presents the office today for evaluation of wounds bilaterally.  She said the right side is healed and well.  She states that the toenail is causing issues and she ripped off last night.  She states the left foot is doing much better and she only notices some bleeding she does not wear the offloading shoe.  No swelling or redness and overall she is doing much better.  She has no other concerns today. Denies any systemic complaints such as fevers, chills, nausea, vomiting. No acute changes since last appointment, and no other complaints at this time.   Objective: AAO x3, NAD DP/PT pulses palpable bilaterally, CRT less than 3 seconds On the plantar aspect the right hallux wound is healed.  There is piece of the toenail still present in the nail corner and some slight erythema from inflammation as opposed to infection on the right hallux from the nail corners.  There is no drainage or pus no increase in warmth or ascending sialitis.  On the left foot submetatarsal 1 is a healing wound which appears to be almost healed at this time.  Some minimal today there is no swelling redness or drainage or any signs of infection.  No open lesions or pre-ulcerative lesions.  No pain with calf compression, swelling, warmth, erythema  Assessment: Healing wounds bilaterally, right traumatic toenail avulsion  Plan: -All treatment options discussed with the patient including all alternatives, risks, complications.  -Regards to the right hallux is healed.  Offloading at all times.  Left foot wound appears to be doing much better.  On her continue antibiotic ointment dressing changes daily.  Offloading all times and we dispensed some offloading inserts for her.  I debrided some of toenail that was still present in the right hallux toenail without any complications.  Recommended antibiotic ointment and a bandage daily.  Recommend her not to do so toenail again. -Monitor for any clinical signs  or symptoms of infection and directed to call the office immediately should any occur or go to the ER. -Patient encouraged to call the office with any questions, concerns, change in symptoms.   [name] DPM

## 2017-12-22 IMAGING — CR DG CHEST 2V
2 series · 2 of 2 positions shown · non-contrast
Comparison: Radiograph 10/26/2014, CT 11/14/2015

CLINICAL DATA: Left-sided chest pain tonight.

EXAM:
CHEST  2 VIEW

[chest pa]
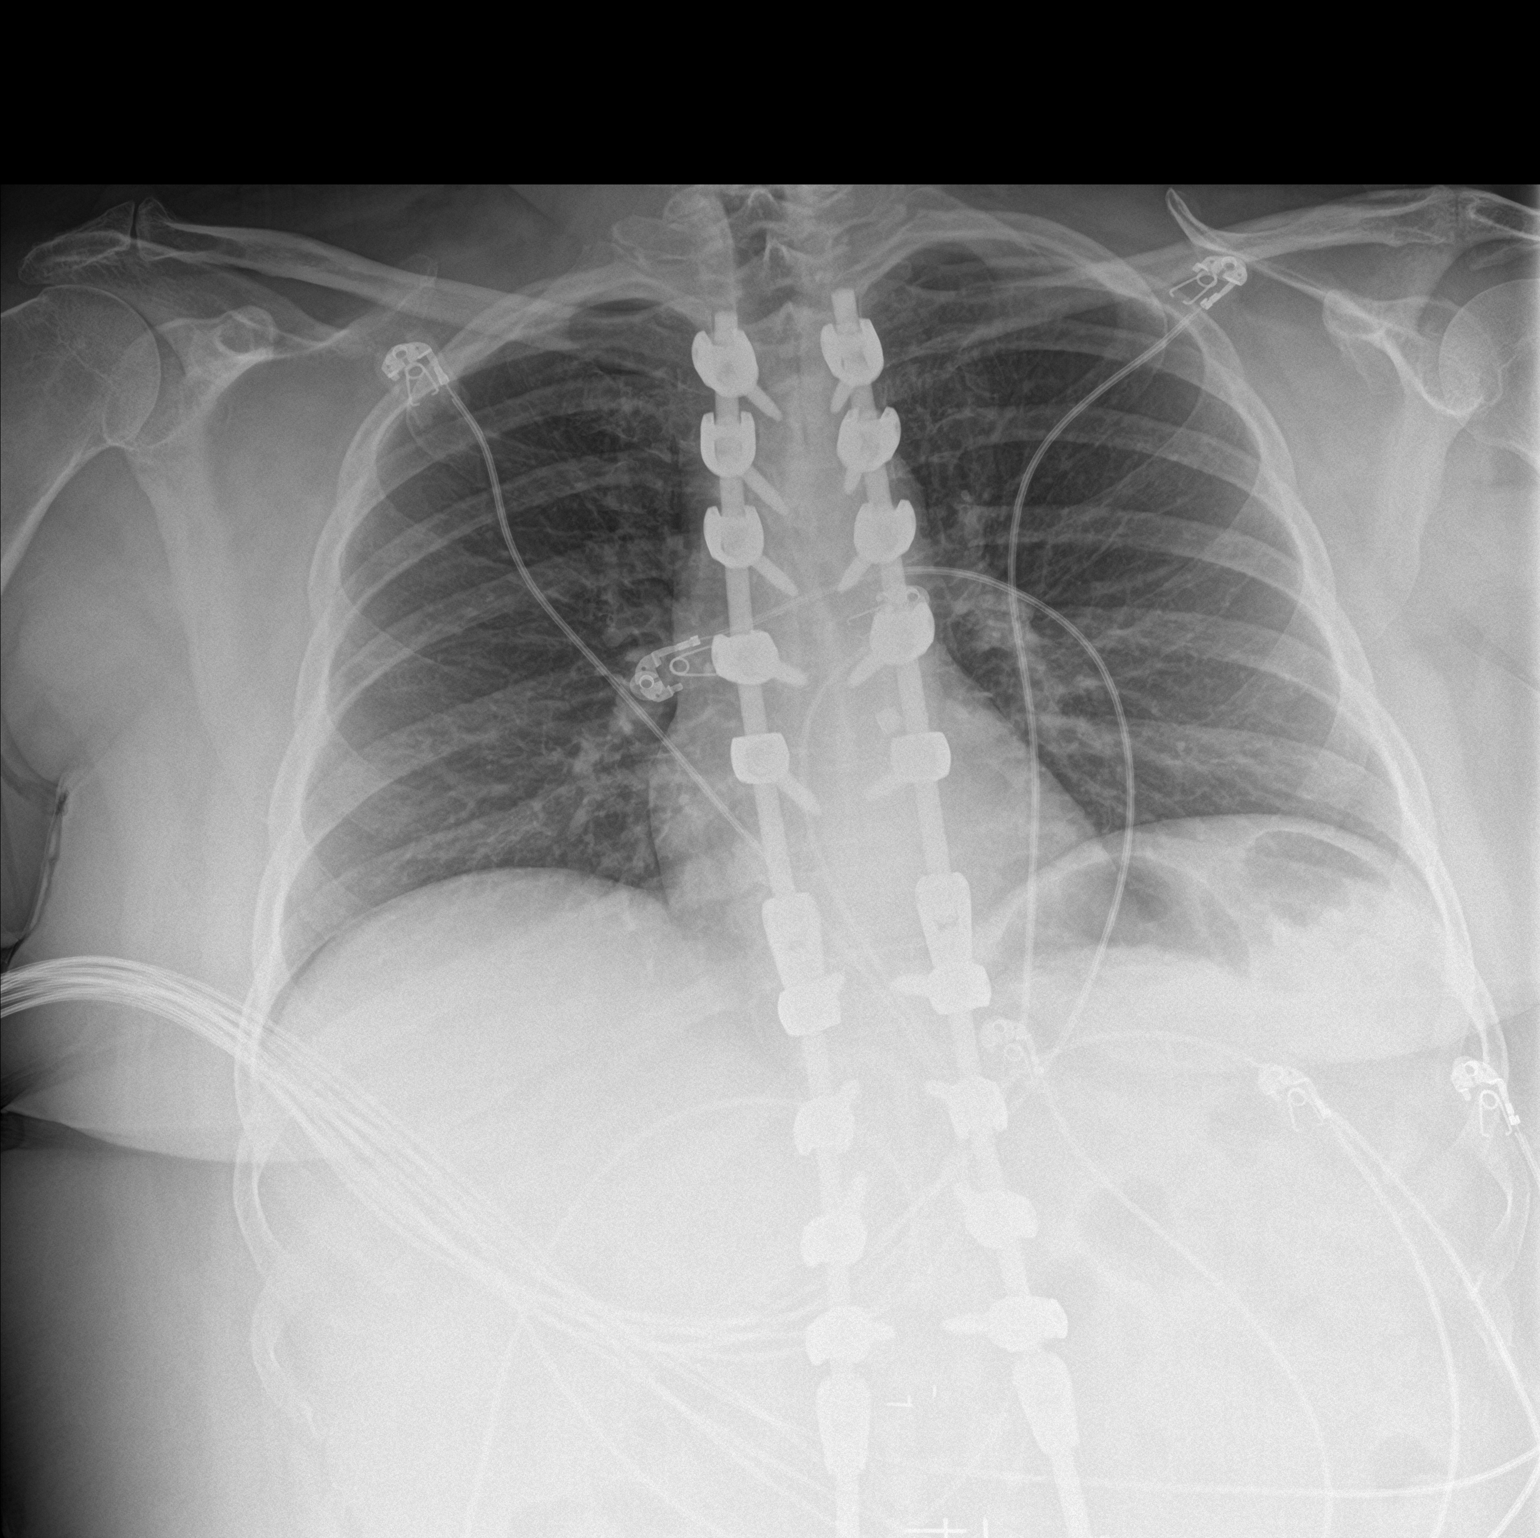

[chest lat]
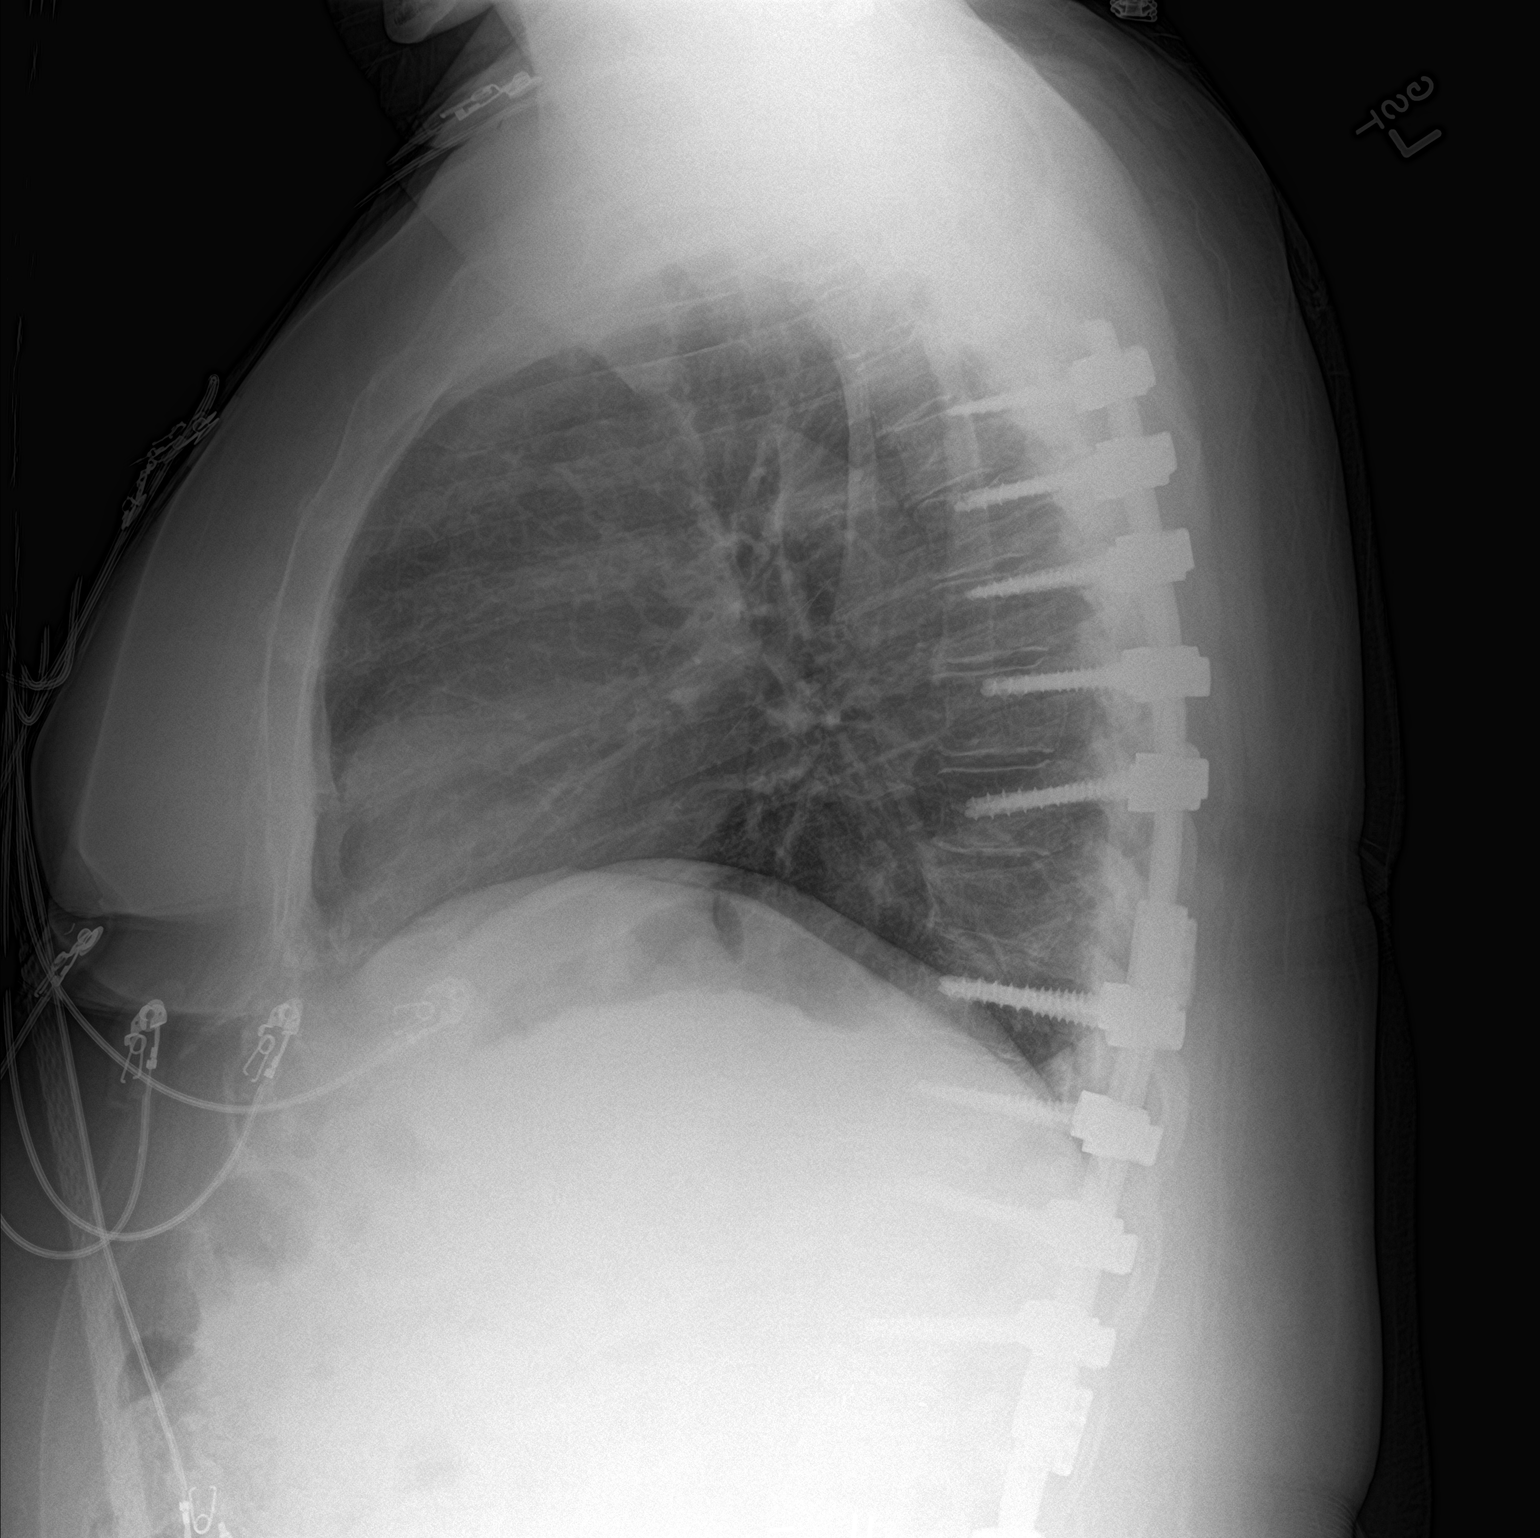

[2 of 2 positions shown; findings below may reference images not displayed]

FINDINGS: The cardiomediastinal contours are normal. The lungs are clear.
Pulmonary vasculature is normal. No consolidation, pleural effusion,
or pneumothorax. No acute osseous abnormalities are seen. Posterior
rod and intrapedicular screw fusion of the thoracolumbar spine.
IMPRESSION: No acute abnormality.

## 2017-12-22 IMAGING — CT CT ANGIO CHEST
2 of 13 series · 16 of 36 positions shown · IV contrast (OMNI)
Comparison: Radiographs earlier this day. Chest CT PE protocol
11/14/2015

CLINICAL DATA: Chest pain.

EXAM:
CT ANGIOGRAPHY CHEST WITH CONTRAST
TECHNIQUE: Multidetector CT imaging of the chest was performed using the
standard protocol during bolus administration of intravenous
contrast. Multiplanar CT image reconstructions and MIPs were
obtained to evaluate the vascular anatomy.
CONTRAST:  80 cc Isovue 370 IV

[Series 11: thins · axial · 0.64mm/px · z∈[+1148,+1323]mm · 15 of 201 slices shown]
[im 13/201  lung]
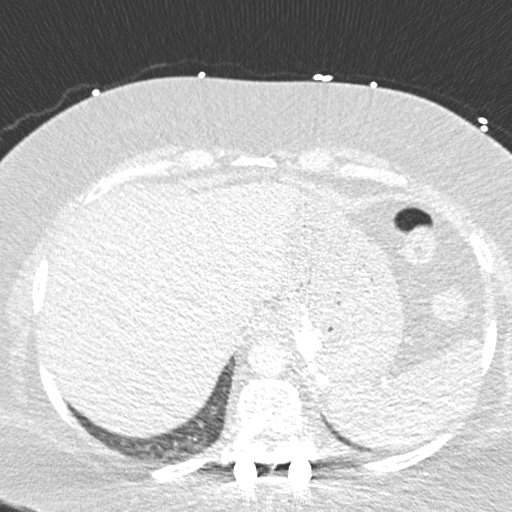
[im 26/201  mediastinal]
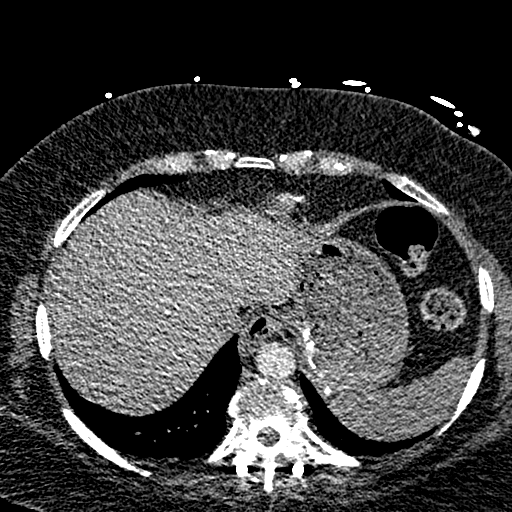
[im 38/201  lung]
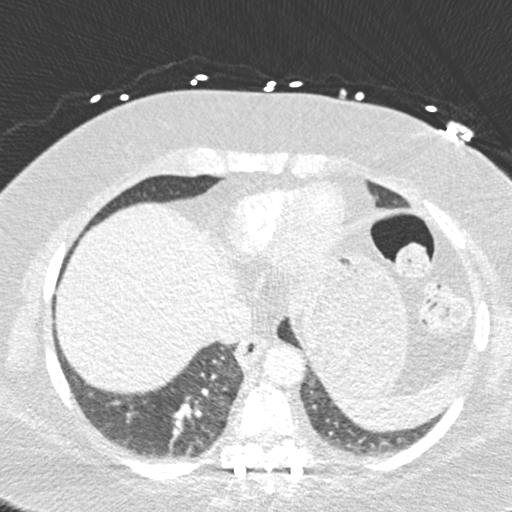
[im 51/201  mediastinal]
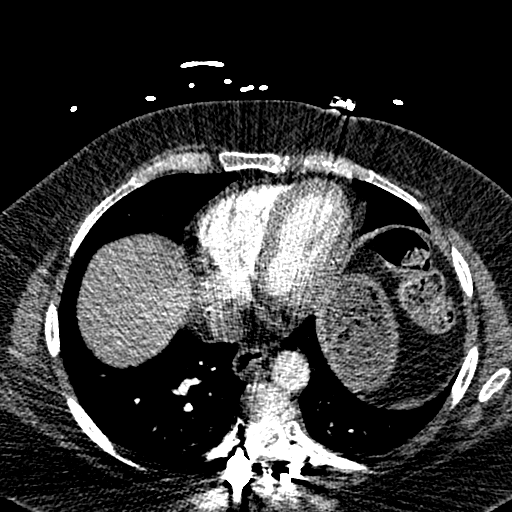
[im 63/201  lung]
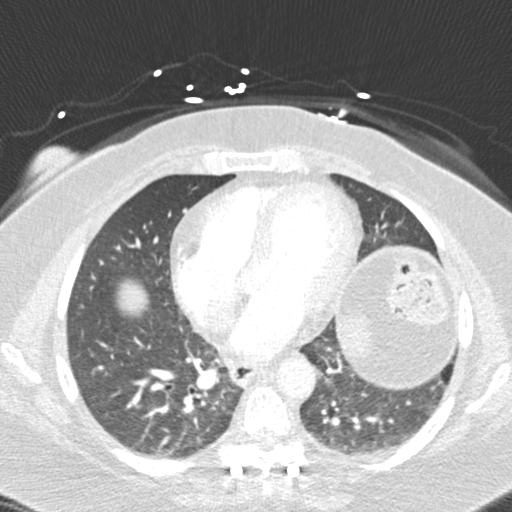
[im 76/201  mediastinal]
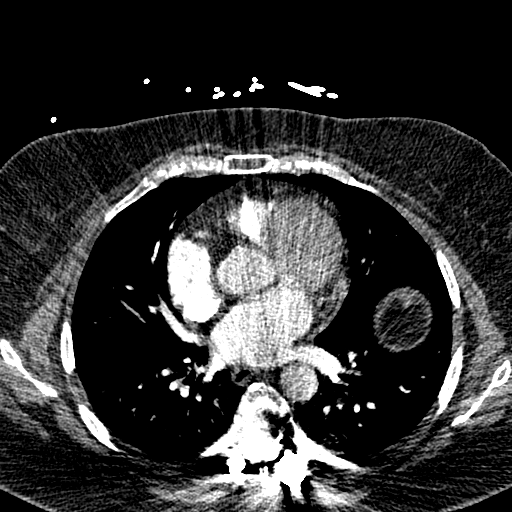
[im 88/201  lung]
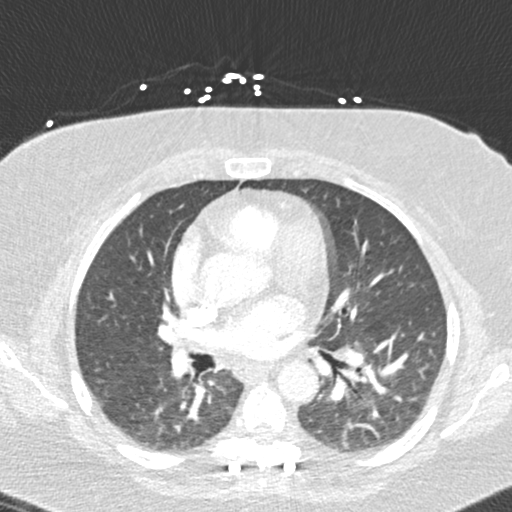
[im 101/201  mediastinal]
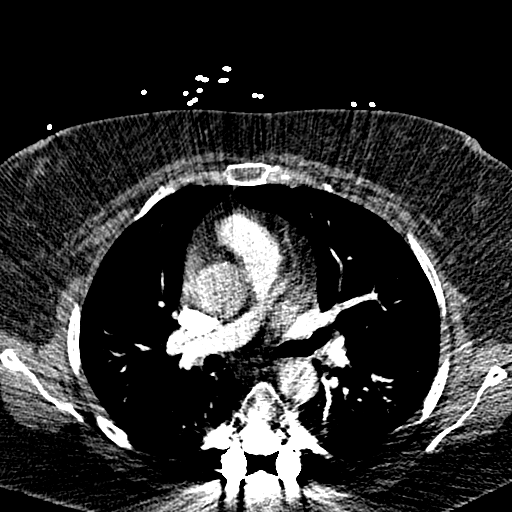
[im 113/201  lung]
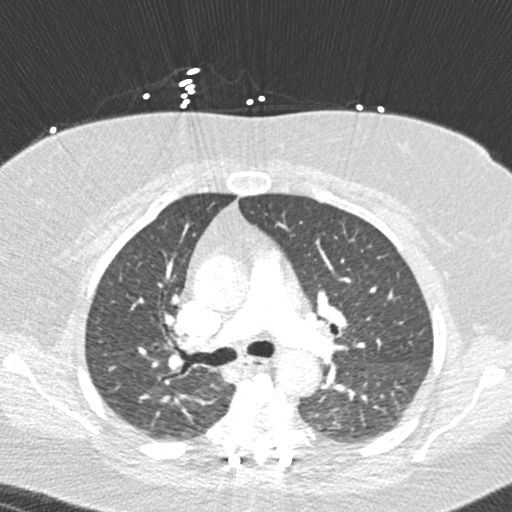
[im 126/201  mediastinal]
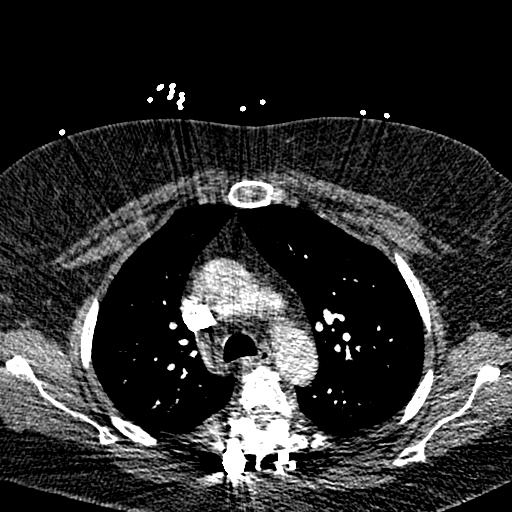
[im 138/201  lung]
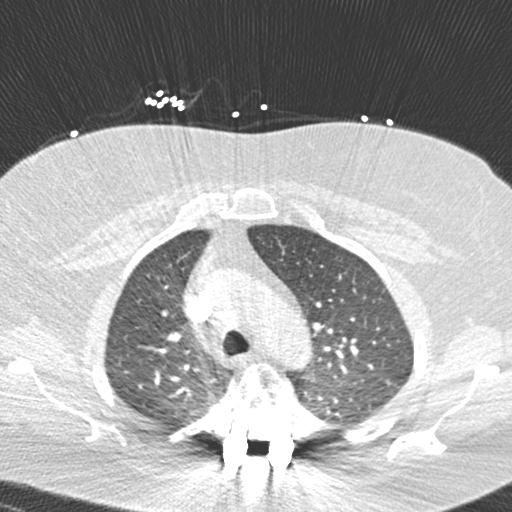
[im 151/201  mediastinal]
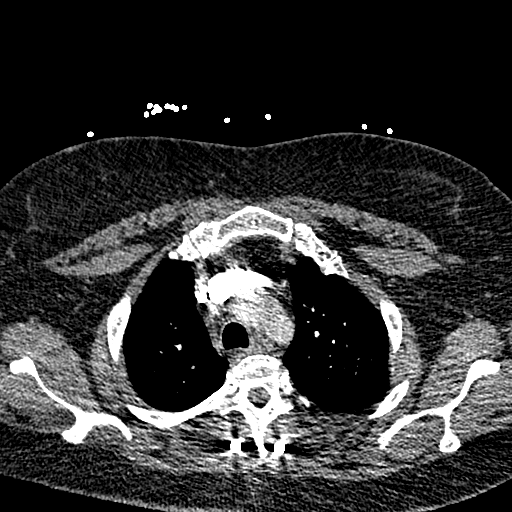
[im 163/201  lung]
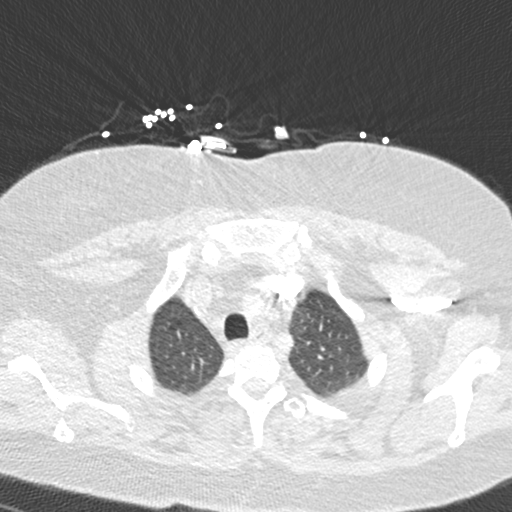
[im 176/201  mediastinal]
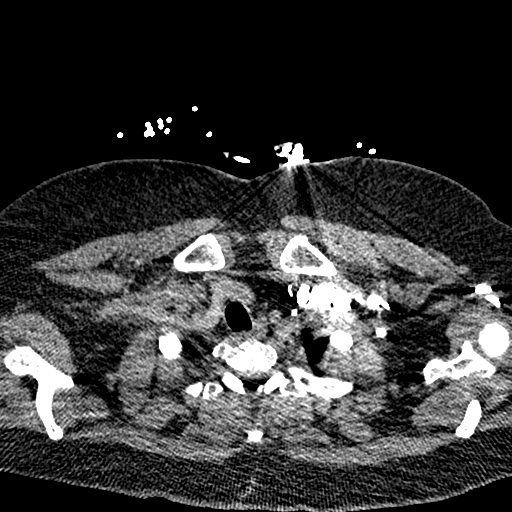
[im 188/201  lung]
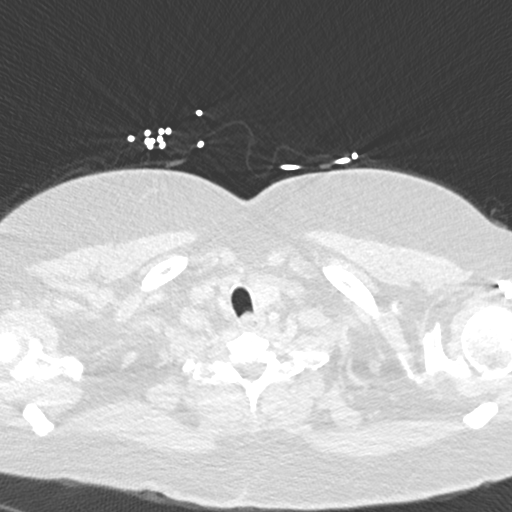

[Series 13: coronal mpr · coronal · 0.41mm/px · 1 of 133 slices shown]
[im 67/133  mediastinal]
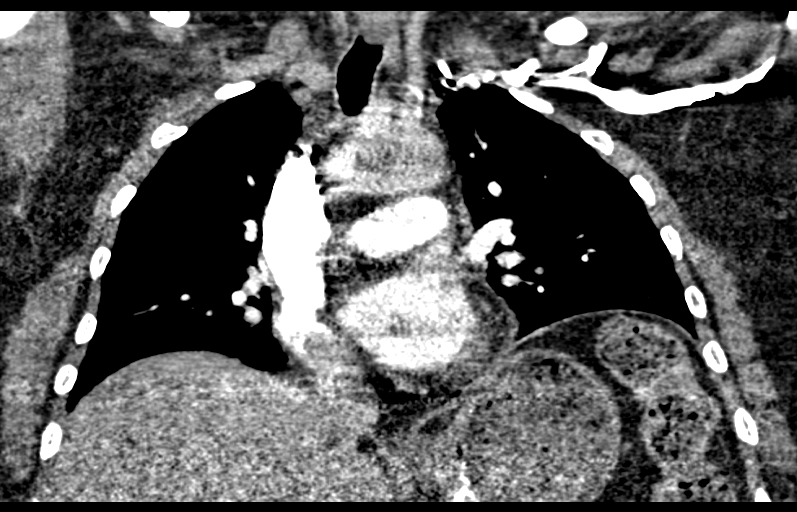

[16 of 36 positions shown; findings below may reference images not displayed]

FINDINGS: Cardiovascular: There are no filling defects within the pulmonary
arteries to suggest pulmonary embolus. No evidence of aortic
dissection. Mild multi chamber cardiomegaly. No pericardial
effusion.

Mediastinum/Nodes: No mediastinal, hilar, or axillary adenopathy.
Tiny hiatal hernia. Visualized thyroid gland is normal.

Lungs/Pleura: No consolidation, pulmonary edema or pleural fluid.
Minimal subsegmental atelectasis at the lung bases.

Upper Abdomen: No acute abnormality.

Musculoskeletal: Posterior rod and intrapedicular screw fusion.
Unchanged compression fracture T9. There are no acute or suspicious
osseous abnormalities.

Review of the MIP images confirms the above findings.
IMPRESSION: No pulmonary embolus or acute intrathoracic abnormality.

## 2017-12-26 ENCOUNTER — Other Ambulatory Visit: Payer: Self-pay | Admitting: Family Medicine

## 2017-12-30 ENCOUNTER — Encounter: Payer: Self-pay | Admitting: Family Medicine

## 2017-12-30 ENCOUNTER — Ambulatory Visit (INDEPENDENT_AMBULATORY_CARE_PROVIDER_SITE_OTHER): Payer: 59 | Admitting: Family Medicine

## 2017-12-30 ENCOUNTER — Ambulatory Visit
Admission: RE | Admit: 2017-12-30 | Discharge: 2017-12-30 | Disposition: A | Discharge status: Home / Self Care | Payer: 59 | Source: Ambulatory Visit | Attending: Family Medicine | Admitting: Family Medicine

## 2017-12-30 ENCOUNTER — Other Ambulatory Visit: Payer: Self-pay

## 2017-12-30 VITALS — BP 126/74 | HR 70 | Temp 98.4°F | Resp 14 | Ht 63.5 in | Wt 232.0 lb

## 2017-12-30 DIAGNOSIS — R300 Dysuria: Secondary | ICD-10-CM

## 2017-12-30 DIAGNOSIS — R109 Unspecified abdominal pain: Secondary | ICD-10-CM

## 2017-12-30 LAB — URINALYSIS, ROUTINE W REFLEX MICROSCOPIC
Bilirubin Urine: NEGATIVE
Glucose, UA: NEGATIVE
Hgb urine dipstick: NEGATIVE
Ketones, ur: NEGATIVE
Leukocytes, UA: NEGATIVE
Nitrite: NEGATIVE
Protein, ur: NEGATIVE
Specific Gravity, Urine: 1.025 (ref 1.001–1.03)
pH: 6 (ref 5.0–8.0)

## 2017-12-30 NOTE — Progress Notes (Signed)
Patient ID: [name] [name], female    DOB: 1965-10-08, 52 y.o.   MRN: [id number]  PCP: [name], MD  Chief Complaint  Patient presents with  . Dysuria    x2 days- burning with urination, back pain, less frequent urination than baseline    Subjective:   [name] [name] is a 52 y.o. female, presents to clinic with CC of 2 to 3 months of decreased urinary frequency.  She states that she is going to the restroom only 2 times a day which is abnormal for her but it changed several months ago.  She does not have any abdominal pain, burning, hematuria associated with this.  2 days ago she felt a sudden sharp pain to her left flank and back without radiation.  She states that this was very concerning to her because she is on chronic narcotic pain medicines daily including 10 mg Percocets multiple times a day +30 mg of morphine daily.  She states that she has had the pain be constant aching and dull ever since its onset.  It was severe when it started and remains constant but moderate without any change with her narcotic pain medicines, no exacerbation or alleviation with any positional changes, with eating, with bowel movements or with urination.  She states her bowels are normal she take his Linzess and constipation secondary to narcotic pain medicine use.  She has had no urinary symptoms although she did state at triage to the nurse that she had burning with urination, she completely denies this with me in denies any history of neurogenic bladder, frequent UTIs, kidney stones.  She denies any urinary frequency, urgency, incontinence.  Denies abdominal pain, dyspepsia, bloating, reflux.  No Fever, chills, sweats, nausea, vomiting. Patient denies any abdominal surgeries in the past, denies any gallstones  Patient Active Problem List   Diagnosis Date Noted  . Left leg cellulitis 09/24/2017  . Cellulitis 09/24/2017  . Syncope and collapse   . Near syncope 11/23/2016  . Abdominal pain  09/23/2016  . Anxiety 09/23/2016  . 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 Constipation 09/23/2016  . DDD (degenerative disc disease), lumbosacral 09/23/2016  . Hypertension 09/23/2016  . Migraines 09/23/2016  . Myelopathy ([place]) 09/23/2016  . Nausea 09/23/2016  . Post laminectomy syndrome 09/23/2016  . Pseudoarthrosis of lumbar spine 09/23/2016  . Seizures ([place]) 09/23/2016  . Shortness of breath 09/23/2016  . Sleep apnea 09/23/2016  . Spinal stenosis of lumbar region 09/23/2016  . Vertigo 09/23/2016  . 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 Chronic pain syndrome   . Esophageal reflux   . Depression   . Rash 11/14/2015  . Cellulitis of great toe of right foot 08/12/2015  . Toe ulcer, right ([place]) 04/11/2015  . Cellulitis of toe of right foot 04/11/2015  . Nonspecific chest pain   . Chest pain 10/26/2014  . Obesity 10/26/2014  . Hypokalemia 10/26/2014  . 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 Atypical angina ([place]) 10/26/2014  . Tardive dyskinesia 08/26/2011  . 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 Asthma 02/24/2011  . Chronic pain 02/24/2011  . Essential hypertension 02/24/2011  . History of migraine headaches 02/24/2011     Prior to Admission medications   Medication Sig Start Date End Date Taking? Authorizing Provider  albuterol (PROVENTIL HFA;VENTOLIN HFA) 108 (90 Base) MCG/ACT inhaler Inhale 2 puffs into the lungs every 6 (six) hours as needed for wheezing  or shortness of breath. 11/02/17  Yes [name], MD  busPIRone (BUSPAR) 10 MG tablet Take 1 tablet by mouth three times a day For anxiety 04/26/17  Yes [provider]  Calcium Carbonate-Vit D-Min (CALCIUM 1200 PO) Take 1 tablet by mouth daily.   Yes [provider]  citalopram (CELEXA) 40 MG tablet Take 40 mg by mouth at bedtime.  05/28/11  Yes Darrol Jump, MD  clotrimazole (LOTRIMIN) 1 % cream Apply 1 application topically 2 (two) times  daily. 04/21/17  Yes Shelly Bombard, MD  cyclobenzaprine (FLEXERIL) 10 MG tablet Take 10 mg by mouth 2 (two) times daily as needed for muscle spasms.    Yes [provider]  Dextran 70-Hypromellose, PF, (ARTIFICIAL TEARS PF) 0.1-0.3 % SOLN Place 1-2 drops into both eyes 3 (three) times daily as needed (for dry eyes).   Yes [provider]  EMBEDA 30-1.2 MG CPCR Take 1 capsule by mouth 2 (two) times daily.  10/19/16  Yes [provider]  estradiol (ESTRACE) 1 MG tablet Take 1 tablet (1 mg total) by mouth daily. 10/06/17  Yes [name], MD  fluticasone (FLONASE) 50 MCG/ACT nasal spray Place 2 sprays into both nostrils daily as needed for allergies. 09/29/17  Yes [name], MD  gabapentin (NEURONTIN) 600 MG tablet Take 1 tablet (600 mg total) by mouth 3 (three) times daily. For anxiety and pain management. May have to stop to take ECT. Patient taking differently: Take 600 mg by mouth 4 (four) times daily. For anxiety and pain management 05/28/11  Yes Darrol Jump, MD  haloperidol (HALDOL) 5 MG tablet Take 1 tablet (5 mg total) by mouth at bedtime. For psychosis 11/15/15  Yes Barton Dubois, MD  hydrochlorothiazide (HYDRODIURIL) 25 MG tablet TAKE ONE TABLET BY MOUTH DAILY 01/11/17  Yes [name], MD  levocetirizine (XYZAL) 5 MG tablet TAKE ONE TABLET BY MOUTH EVERY EVENING 11/04/17  Yes [name], MD  LINZESS 290 MCG CAPS capsule TAKE ONE CAPSULE BY MOUTH DAILY BEFORE BREAKFAST 12/26/17  Yes [name], MD  meloxicam (MOBIC) 15 MG tablet Take 15 mg by mouth daily. 09/15/17  Yes [provider]  metoprolol succinate (TOPROL XL) 25 MG 24 hr tablet Take 0.5 tablets (12.5 mg total) by mouth daily. 11/01/17  Yes [name], MD  Multiple Vitamins-Minerals (MULTIVITAMIN WITH MINERALS) tablet Take 1 tablet by mouth daily.   Yes [provider]  oxyCODONE-acetaminophen (PERCOCET) 10-325 MG tablet Take 1 tablet by mouth 3 (three)  times daily. 12/14/16  Yes [provider]  pantoprazole (PROTONIX) 40 MG tablet TAKE ONE TABLET BY MOUTH TWICE DAILY 08/08/17  Yes [name], MD  potassium chloride SA (KLOR-CON M20) 20 MEQ tablet Take 2 tablets (40 mEq total) by mouth daily. Patient taking differently: Take 20 mEq by mouth 2 (two) times daily.  11/02/16  Yes [name], MD  predniSONE (DELTASONE) 10 MG tablet Take 1 tablet (10 mg total) by mouth daily. 09/26/17  Yes Donne Hazel, MD  promethazine (PHENERGAN) 25 MG tablet Take 1 tablet (25 mg total) by mouth every 6 (six) hours as needed for nausea or vomiting. 11/14/17  Yes [name], MD  topiramate (TOPAMAX) 50 MG tablet TAKE ONE TABLET BY MOUTH TWICE DAILY 12/26/17  Yes [name], MD  traZODone (DESYREL) 100 MG tablet Take 100 mg by mouth at bedtime. 07/29/14  Yes [provider]     Allergies  Allergen Reactions  . Penicillins Hives and Rash    Has patient had a PCN reaction causing immediate rash, facial/tongue/throat swelling, SOB or lightheadedness with hypotension: Yes Has patient had a PCN reaction causing severe rash involving mucus membranes or skin necrosis: No Has patient had a PCN reaction that required hospitalization: No Has patient had a PCN reaction occurring within the last 10 years: No If all of the above answers are "NO", then may proceed with Cephalosporin use.  . Tetracyclines & Related Other (See Comments)    Syncope and put her "in a coma"  . Tramadol Other (See Comments)    Seizures  . Tetracycline Other (See Comments)    Syncope and "Put me in a coma"  . Ciprofloxacin Rash and Itching  . Codeine Itching and Rash  . Estradiol Rash    Patches broke out the skin     Family History  Problem Relation Age of Onset  . Diabetes Mother   . COPD Mother   . Hypertension Mother   . Hyperlipidemia Mother   . Heart disease Father   . Hyperlipidemia Father   . Hypertension Father   . Cancer Father   .  Heart disease Brother   . Heart disease Daughter   . Heart disease Maternal Grandmother   . Hypertension Maternal Grandmother   . Heart disease Maternal Grandfather   . Kidney cancer Paternal Grandmother   . Heart disease Paternal Grandfather      Social History   Socioeconomic History  . Marital status: Legally Separated    Spouse name: Not on file  . Number of children: 3  . Years of education: 38  . Highest education level: Not on file  Occupational History  . Occupation: Disabled  Social Needs  . Financial resource strain: Not on file  . Food insecurity:    Worry: Not on file    Inability: Not on file  . Transportation needs:    Medical: Not on file    Non-medical: Not on file  Tobacco Use  . Smoking status: Never Smoker  . Smokeless tobacco: Never Used  Substance and Sexual Activity  . Alcohol use: No  . Drug use: No  . Sexual activity: Not on file  Lifestyle  . Physical activity:    Days per week: Not on file    Minutes per session: Not on file  . Stress: Not on file  Relationships  . Social connections:    Talks on phone: Not on file    Gets together: Not on file    Attends religious service: Not on file    Active member of club or organization: Not on file    Attends meetings of clubs or organizations: Not on file    Relationship status: Not on file  . Intimate partner violence:    Fear of current or ex partner: Not on file    Emotionally abused: Not on file    Physically abused: Not on file    Forced sexual activity: Not on file  Other Topics Concern  . Not on file  Social History Narrative   Patient drink 4 16oz caffeine drinks a day      Review of Systems  Constitutional: Negative.   HENT: Negative.   Eyes: Negative.   Respiratory: Negative.   Cardiovascular: Negative.   Gastrointestinal: Negative.   Endocrine: Negative.   Genitourinary: Negative.   Musculoskeletal: Negative.   Skin: Negative.   Allergic/Immunologic: Negative.     Neurological: Negative.  Hematological: Negative.   Psychiatric/Behavioral: Negative.   All other systems reviewed and are negative.      Objective:    Vitals:   12/30/17 1406  BP: 126/74  Pulse: 70  Resp: 14  Temp: 98.4 F (36.9 C)  TempSrc: Oral  SpO2: 97%  Weight: 232 lb (105.2 kg)  Height: 5' 3.5" (1.613 m)      Physical Exam  Constitutional: She is oriented to person, place, and time. She appears well-developed and well-nourished.  Non-toxic appearance. No distress.  Chronically ill-appearing and obese female appears a little older than stated age, no acute distress, nontoxic-appearing  HENT:  Head: Normocephalic and atraumatic.  Right Ear: External ear normal.  Left Ear: External ear normal.  Nose: Nose normal.  Mouth/Throat: Uvula is midline, oropharynx is clear and moist and mucous membranes are normal.  Eyes: Pupils are equal, round, and reactive to light. Conjunctivae, EOM and lids are normal. No scleral icterus.  Neck: Normal range of motion and phonation normal. Neck supple. No tracheal deviation present.  Cardiovascular: Normal rate, regular rhythm, normal heart sounds and normal pulses. Exam reveals no gallop and no friction rub.  No murmur heard. Pulses:      Radial pulses are 2+ on the right side, and 2+ on the left side.       Posterior tibial pulses are 2+ on the right side, and 2+ on the left side.  Pulmonary/Chest: Effort normal and breath sounds normal. No stridor. No respiratory distress. She has no wheezes. She has no rhonchi. She has no rales. She exhibits no tenderness.  Abdominal: Soft. Normal appearance and bowel sounds are normal. She exhibits no distension and no mass. There is no tenderness. There is no rebound and no guarding. No hernia.  Abdomen obese, soft, normal bowel sounds throughout, no CVA tenderness bilaterally, no rebound or guarding, negative Murphy's and no McBurney's point tenderness  Musculoskeletal: Normal range of motion.  She exhibits no edema or deformity.  No bony or muscle tenderness to palpation along cervical to lumbar spine and along paraspinal muscles.  She is able to go from sitting to standing and laying down on exam table with some grimacing and moving slowly however range of motion appears normal  Lymphadenopathy:    She has no cervical adenopathy.  Neurological: She is alert and oriented to person, place, and time. She exhibits normal muscle tone. Gait normal.  Skin: Skin is warm, dry and intact. Capillary refill takes less than 2 seconds. No rash noted. She is not diaphoretic. No pallor.  Psychiatric: She has a normal mood and affect. Her speech is normal and behavior is normal.     Results for orders placed or performed in visit on 12/30/17  Urinalysis, Routine w reflex microscopic  Result Value Ref Range   Color, Urine YELLOW YELLOW   APPearance CLEAR CLEAR   Specific Gravity, Urine 1.025 1.001 - 1.03   pH 6.0 5.0 - 8.0   Glucose, UA NEGATIVE NEGATIVE   Bilirubin Urine NEGATIVE NEGATIVE   Ketones, ur NEGATIVE NEGATIVE   Hgb urine dipstick NEGATIVE NEGATIVE   Protein, ur NEGATIVE NEGATIVE   Nitrite NEGATIVE NEGATIVE   Leukocytes, UA NEGATIVE NEGATIVE        Assessment & Plan:     1.  Left flank pain No tenderness to palpation of back or abdomen, no CVA tenderness, unclear etiology of what her pain is.  I agree this symptom of worsening pain is concerning in the setting of such high  narcotic pain medication use however there is no physical exam findings or history that points to an etiology of this pain.  Plan to get imaging of thoracic spine and KUB. 2.  Decreased urinary frequency Cannot find a code 2 but this is a diagnosis in this medical record therefore the nurse has put dysuria which patient later denied to me, this could is associated with urinalysis which was done in clinic.  Analysis was completely negative.  Seems to make any kidney stone or UTI highly unlikely.  Her bladder  symptoms and frequency of voiding is not an acute change, she states that began 3 months ago.  If any further issues with this may consider urology referral however at this time would like to try and figure out etiology of her flank pain  Plan patient was to get imaging.   With any noted change in urine color she was urged to get reevaluated over the weekend.  Scribed to her urinary changes that occur with a kidney stone, no history of this and currently does not appear to have this presentation definitely no physical exam findings for [place], hydronephrosis but body habitus may be limiting exam along with narcotic pain medicine?   With any worsening of back pain, any new abdominal pain with nausea or vomiting she was to get reevaluated over the weekend.      Delsa Grana, PA-C 12/30/17 2:22 PM

## 2018-01-03 IMAGING — MR MR LUMBAR SPINE W/O CM
4 of 5 series · 26 of 48 positions shown · non-contrast
Comparison: 05/29/2015 thoracic radiographs. 02/06/2015 CT abdomen
and pelvis.

CLINICAL DATA: 50 y/o  F; chronic lower back pain.

EXAM:
MRI LUMBAR SPINE WITHOUT CONTRAST
TECHNIQUE: Multiplanar, multisequence MR imaging of the lumbar spine was
performed. No intravenous contrast was administered.

[Series 3: T2 post-contrast · sagittal · 4.0mm · 0.55mm/px · 6 of 13 slices shown]
[im 1/13]
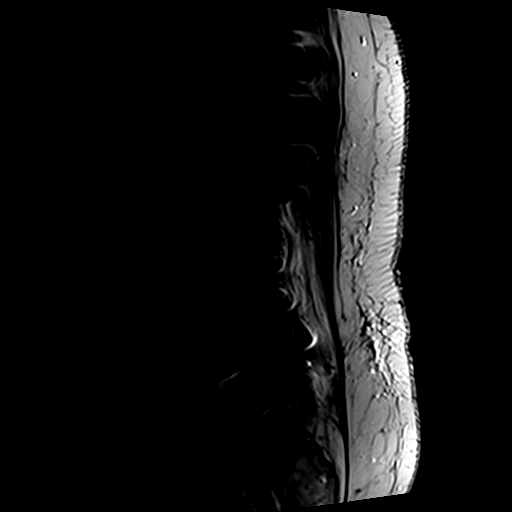
[im 3/13]
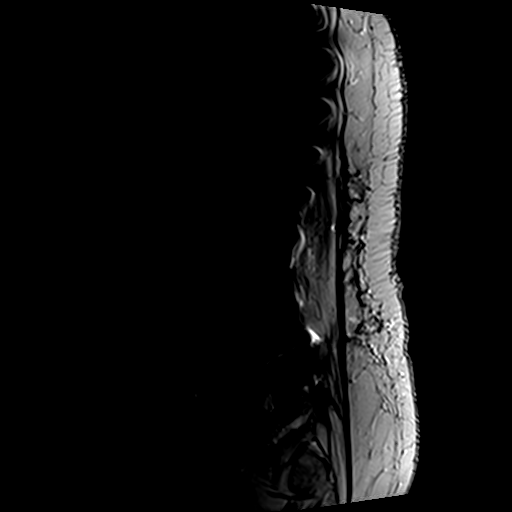
[im 5/13]
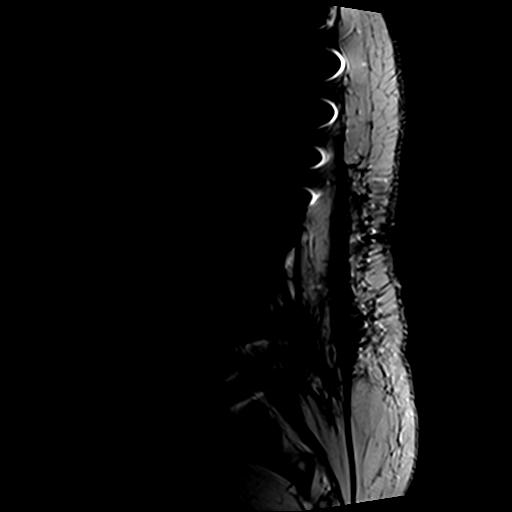
[im 8/13]
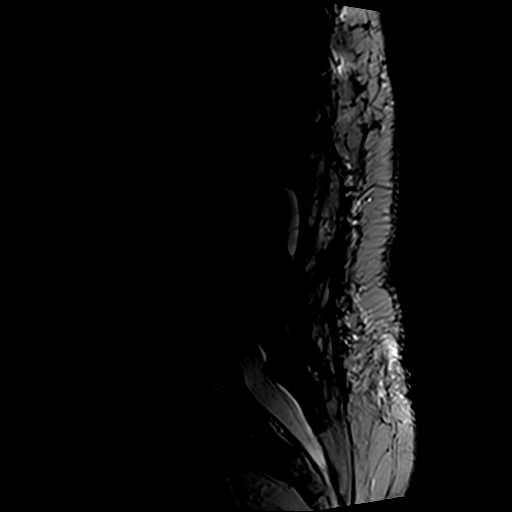
[im 10/13]
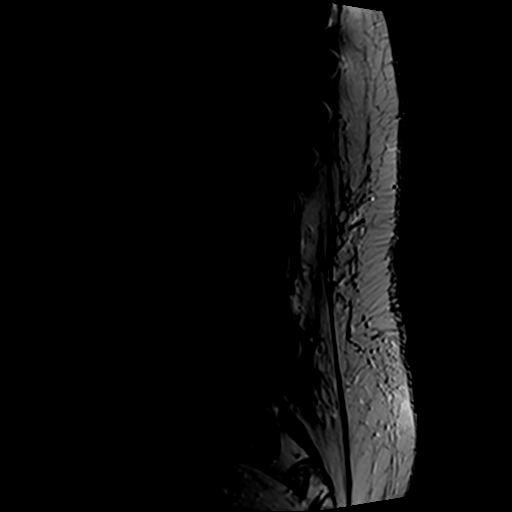
[im 13/13]
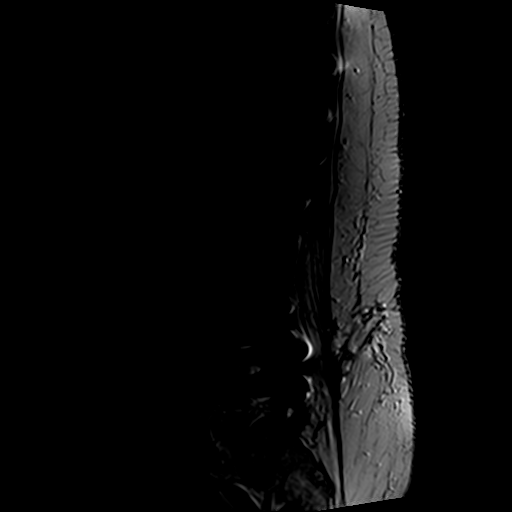

[Series 4: T1 · sagittal · 4.0mm · 0.55mm/px · 5 of 13 slices shown (1 of 2)]
[im 1/13]
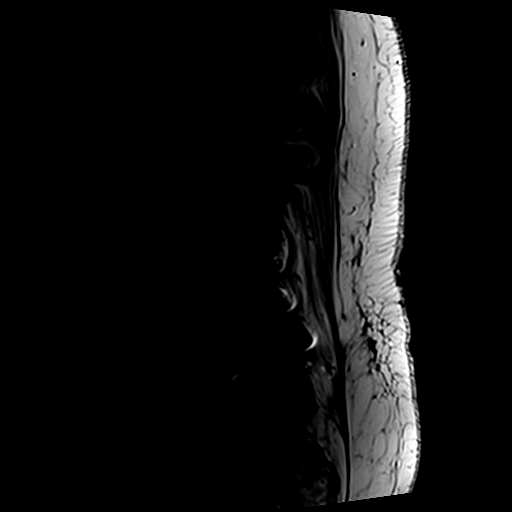
[im 4/13]
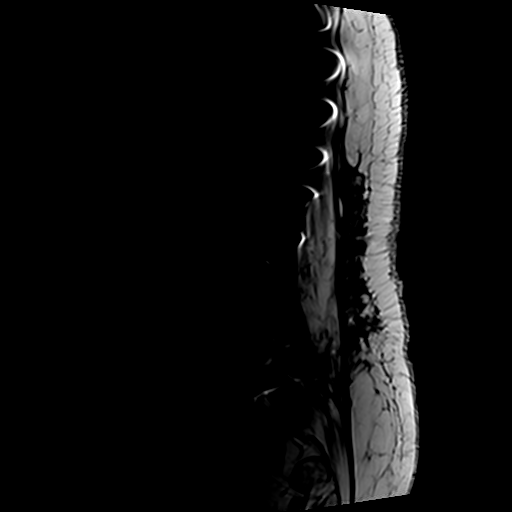
[im 7/13]
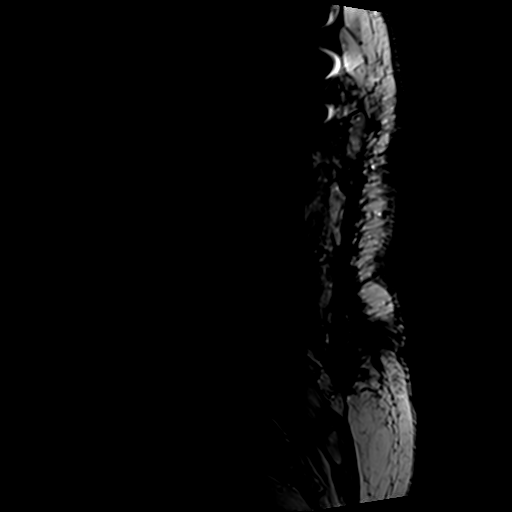
[im 10/13]
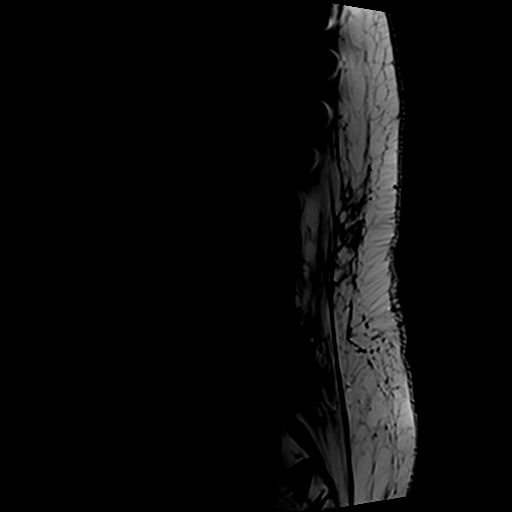
[im 13/13]
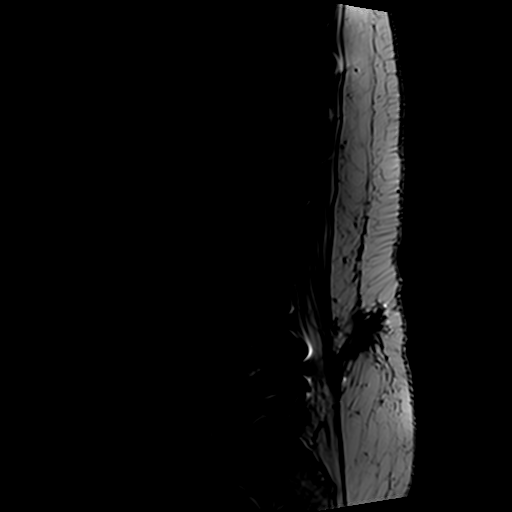

[Series 6: T2 · axial · 4.0mm · 0.70mm/px · z∈[-19,+163]mm · 10 of 38 slices shown]
[im 3/38]
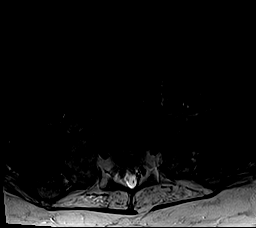
[im 5/38]
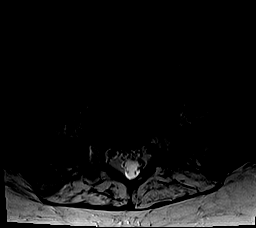
[im 8/38]
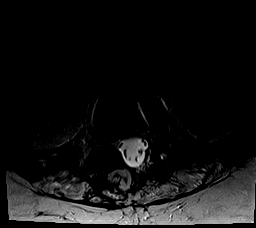
[im 13/38]
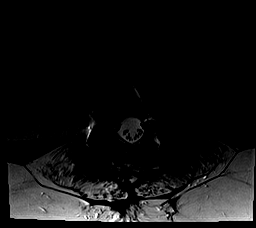
[im 18/38]
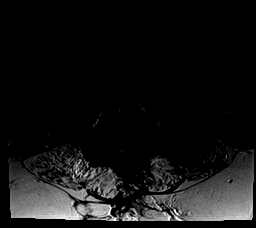
[im 20/38]
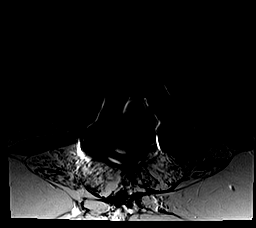
[im 23/38]
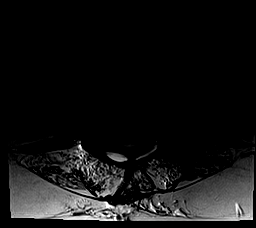
[im 28/38]
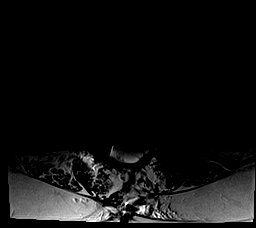
[im 33/38]
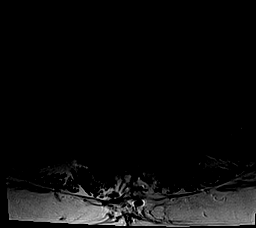
[im 38/38]
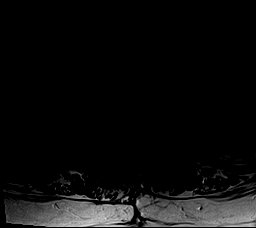

[Series 7: T1 · axial · 4.0mm · 0.35mm/px · z∈[-19,+137]mm · 5 of 38 slices shown (2 of 2)]
[im 3/38]
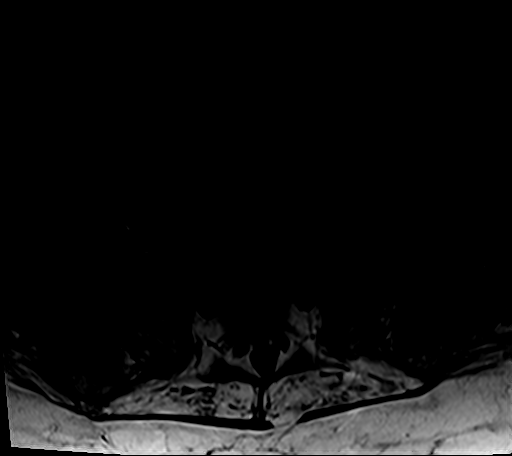
[im 5/38]
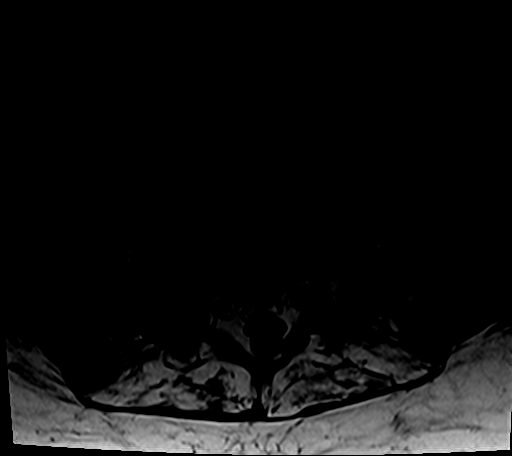
[im 8/38]
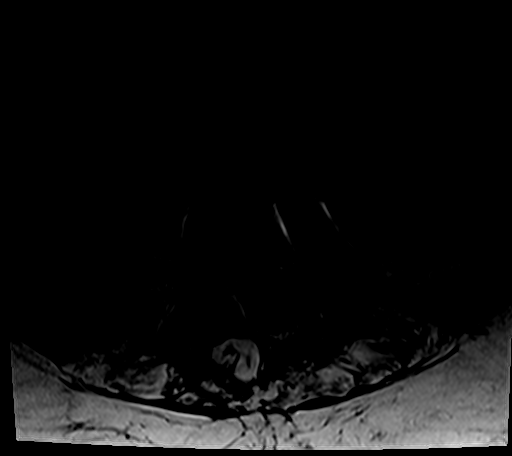
[im 20/38]
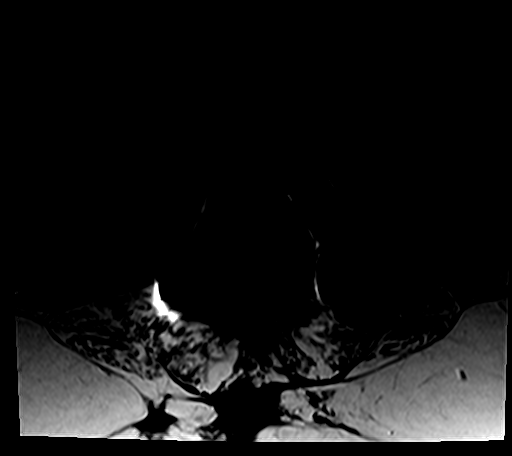
[im 33/38]
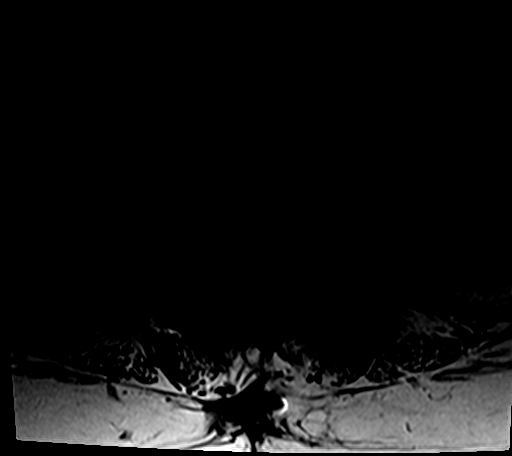

[26 of 48 positions shown; findings below may reference images not displayed]

FINDINGS: Segmentation:  Standard.

Alignment:  Physiologic.

Vertebrae: L1-2 and L2-3 laminectomy. Posterior instrumented fusion
of the visible thoracic and lumbar spine to the L5 level inferiorly.
Susceptibility artifact from fusion hardware partially obscures the
vertebral bodies at those levels. No pedicle screws present in the
L2 vertebral body. No appreciable bone marrow edema or abnormal disc
signal. No prevertebral fluid collection.

Conus medullaris: Extends to the L1 level and appears normal.

Paraspinal and other soft tissues: Fluid collection within
paraspinal muscles from the T12-L1 level to the L3 level similar to
prior CT compatible postoperative seroma.

Disc levels:

The spinal canal is widely patent. Susceptibility artifact from
hardware partially obscures the neural foramen with most pronounced
in the lower thoracic spine to the L1 level. Prominent marginal
osteophytes in the right foraminal zone at the L1-2 level results in
moderate right-sided L1-2 foraminal narrowing. Of the neural foramen
appear patent.
IMPRESSION: 1. Posterior instrumentation fusion hardware produces susceptibility
artifact partially obscuring the vertebral bodies throughout the
visible thoracic and lumbar spine.
2. No acute osseous abnormality identified.
3. Widely patent spinal canal.
4. Eccentric marginal osteophytes at L1-2 moderately narrow the
right-sided neural foramen. Otherwise visualized neural foramen
appear patent.

By: Liz Emmil Dueno M.D.

## 2018-01-09 ENCOUNTER — Encounter: Payer: Self-pay | Admitting: *Deleted

## 2018-01-10 ENCOUNTER — Other Ambulatory Visit: Payer: Self-pay | Admitting: Family Medicine

## 2018-01-10 IMAGING — NM NM MISC PROCEDURE
9 series · 54 of 54 positions shown · non-contrast
Comparison: none

[Series 1: wbr stress-gsp · 6.40mm/px · 6 of 509 frames shown]
[frame 43/509]
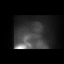
[frame 127/509]
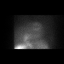
[frame 212/509]
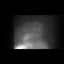
[frame 297/509]
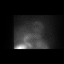
[frame 382/509]
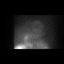
[frame 467/509]
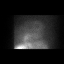

[Series 1: stress sax gs · 6.4mm · 6.40mm/px · 6 of 184 frames shown]
[frame 16/184]
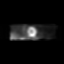
[frame 46/184]
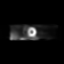
[frame 77/184]
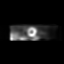
[frame 108/184]
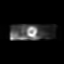
[frame 138/184]
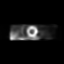
[frame 169/184]
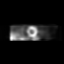

[Series 1: wbr_s-proj_st wbr stress-gsp · 6.40mm/px · 6 of 512 frames shown]
[frame 43/512]
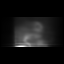
[frame 128/512]
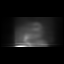
[frame 214/512]
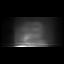
[frame 299/512]
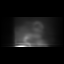
[frame 384/512]
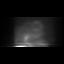
[frame 470/512]
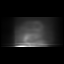

[Series 1: stress sax · 6.4mm · 6.40mm/px · 6 of 23 frames shown]
[frame 2/23]
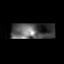
[frame 6/23]
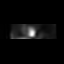
[frame 10/23]
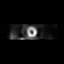
[frame 14/23]
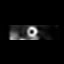
[frame 18/23]
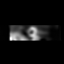
[frame 22/23]
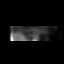

[Series 2: wbr_s-proj_st wbr stress-sum-em · 6.40mm/px · 6 of 64 frames shown]
[frame 6/64]
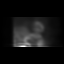
[frame 16/64]
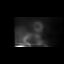
[frame 27/64]
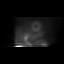
[frame 38/64]
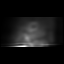
[frame 48/64]
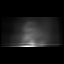
[frame 59/64]
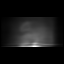

[Series 2: wbr stress-sum-em · 6.40mm/px · 6 of 64 frames shown]
[frame 6/64]
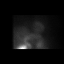
[frame 16/64]
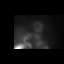
[frame 27/64]
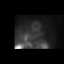
[frame 38/64]
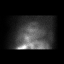
[frame 48/64]
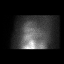
[frame 59/64]
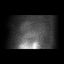

[Series 3: rest sax · 6.4mm · 6.40mm/px · 6 of 23 frames shown]
[frame 2/23]
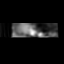
[frame 6/23]
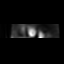
[frame 10/23]
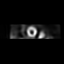
[frame 14/23]
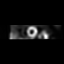
[frame 18/23]
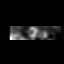
[frame 22/23]
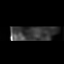

[Series 3: wbr_r-proj_st wbr rest · 6.40mm/px · 6 of 64 frames shown]
[frame 6/64]
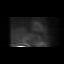
[frame 16/64]
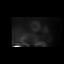
[frame 27/64]
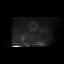
[frame 38/64]
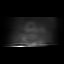
[frame 48/64]
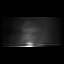
[frame 59/64]
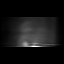

[Series 3: wbr rest · 6.40mm/px · 6 of 64 frames shown]
[frame 6/64]
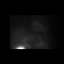
[frame 16/64]
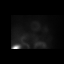
[frame 27/64]
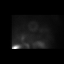
[frame 38/64]
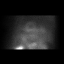
[frame 48/64]
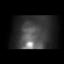
[frame 59/64]
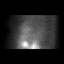

[54 of 54 positions shown; findings below may reference images not displayed]

Canned report from images found in remote index.

Refer to host system for actual result text.

## 2018-01-12 ENCOUNTER — Ambulatory Visit: Payer: 59 | Admitting: Podiatry

## 2018-01-17 ENCOUNTER — Ambulatory Visit: Payer: 59 | Admitting: Podiatry

## 2018-01-20 ENCOUNTER — Encounter: Payer: Self-pay | Admitting: Family Medicine

## 2018-01-23 ENCOUNTER — Ambulatory Visit (INDEPENDENT_AMBULATORY_CARE_PROVIDER_SITE_OTHER): Payer: 59

## 2018-01-23 ENCOUNTER — Ambulatory Visit (INDEPENDENT_AMBULATORY_CARE_PROVIDER_SITE_OTHER): Payer: 59 | Admitting: Podiatry

## 2018-01-23 VITALS — BP 111/67 | HR 90

## 2018-01-23 DIAGNOSIS — L97511 Non-pressure chronic ulcer of other part of right foot limited to breakdown of skin: Secondary | ICD-10-CM

## 2018-01-23 DIAGNOSIS — L02612 Cutaneous abscess of left foot: Secondary | ICD-10-CM

## 2018-01-23 DIAGNOSIS — L97529 Non-pressure chronic ulcer of other part of left foot with unspecified severity: Secondary | ICD-10-CM | POA: Diagnosis not present

## 2018-01-23 DIAGNOSIS — L03032 Cellulitis of left toe: Secondary | ICD-10-CM | POA: Diagnosis not present

## 2018-01-23 DIAGNOSIS — M869 Osteomyelitis, unspecified: Secondary | ICD-10-CM | POA: Diagnosis not present

## 2018-01-23 DIAGNOSIS — M79672 Pain in left foot: Secondary | ICD-10-CM

## 2018-01-23 DIAGNOSIS — L97524 Non-pressure chronic ulcer of other part of left foot with necrosis of bone: Secondary | ICD-10-CM | POA: Diagnosis not present

## 2018-01-23 IMAGING — CR DG HIP (WITH OR WITHOUT PELVIS) 5+V BILAT
4 series · 4 of 4 positions shown · non-contrast
Comparison: None.

CLINICAL DATA: Chronic bilateral hip pain without known injury.

EXAM:
DG HIP (WITH OR WITHOUT PELVIS) 5+V BILAT

[w hip ap left]
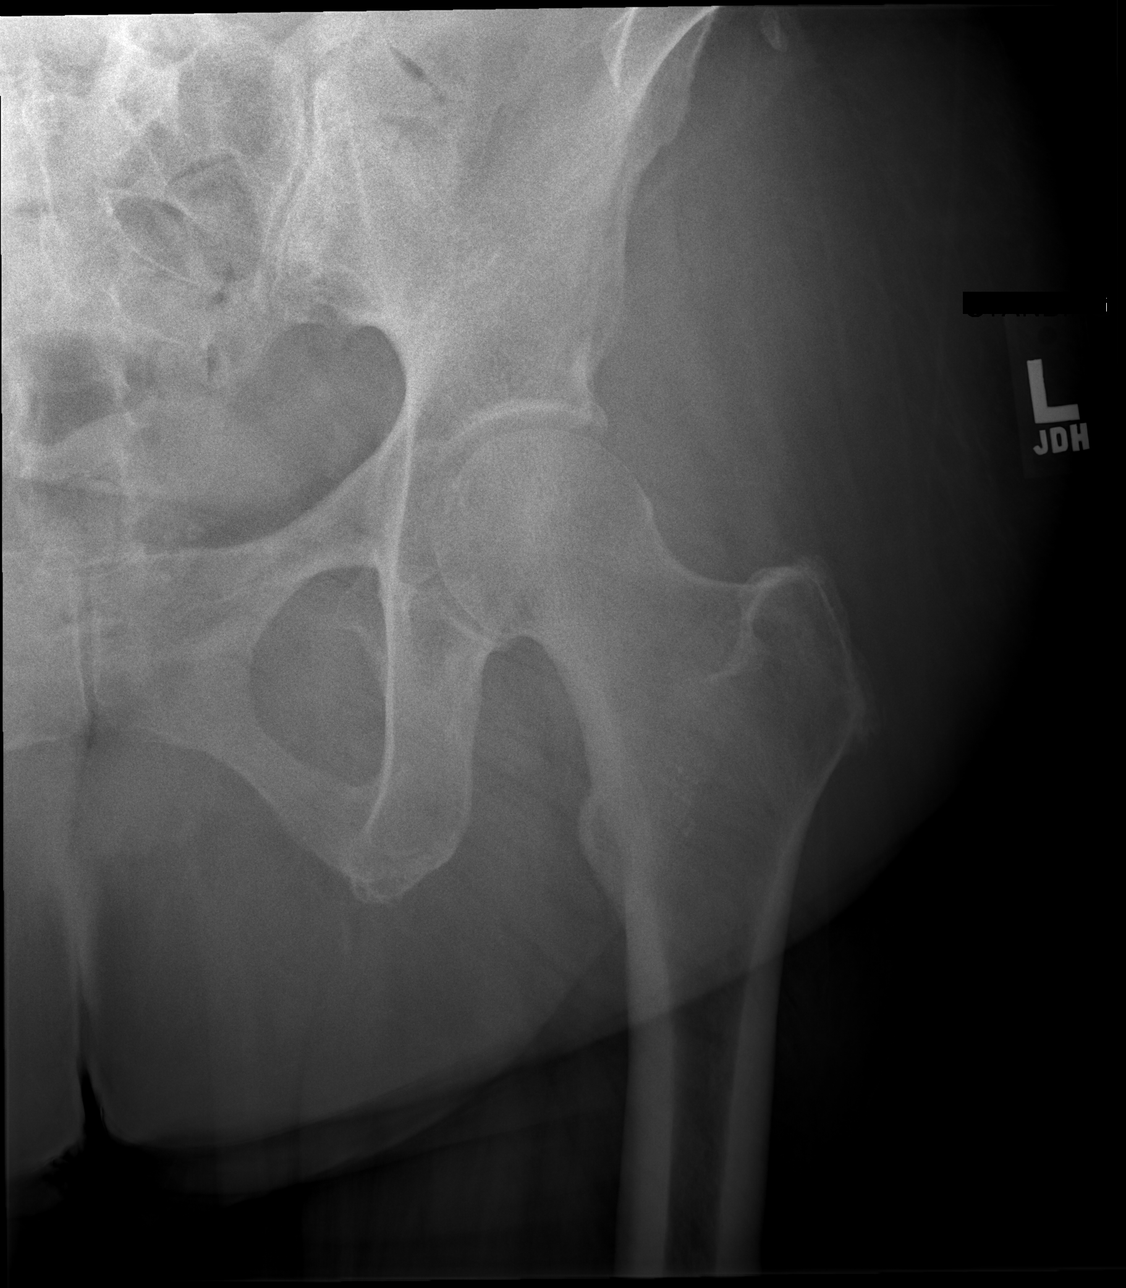

[w hip lat left]
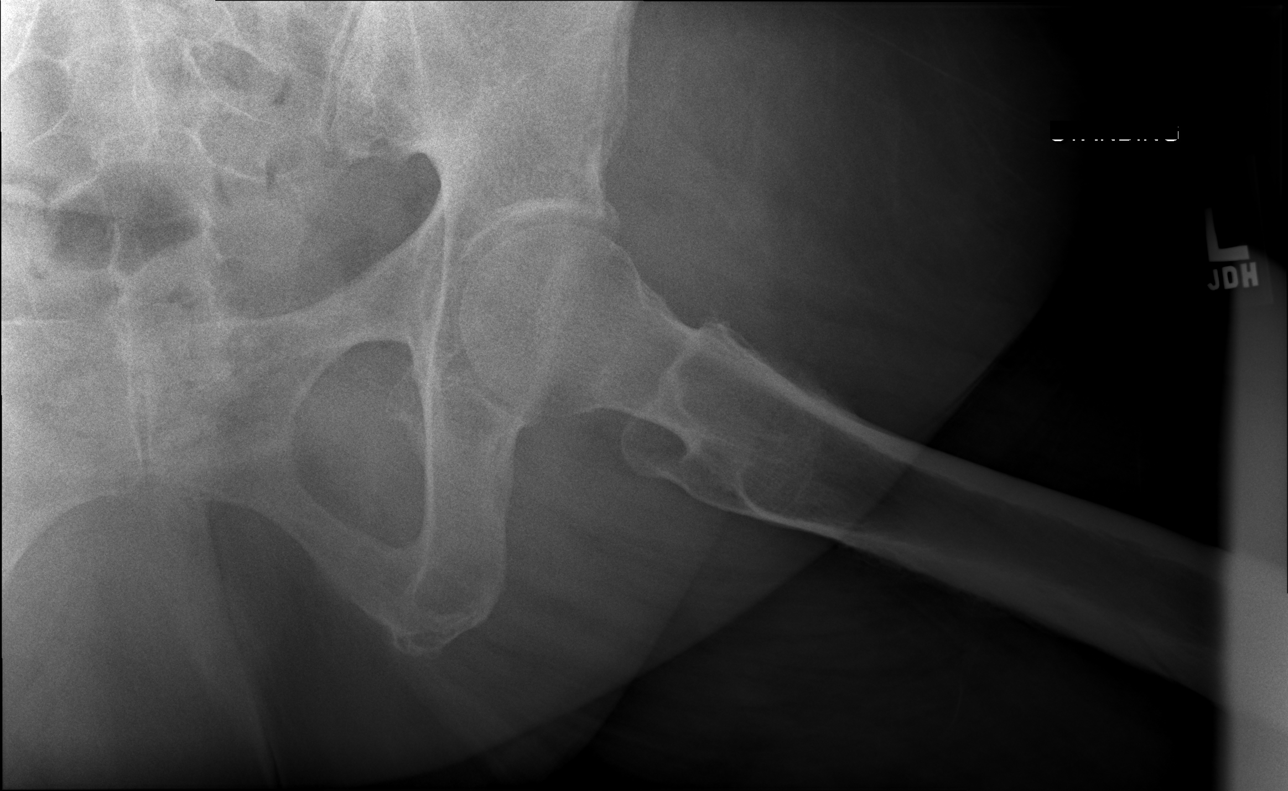

[w hip ap right]
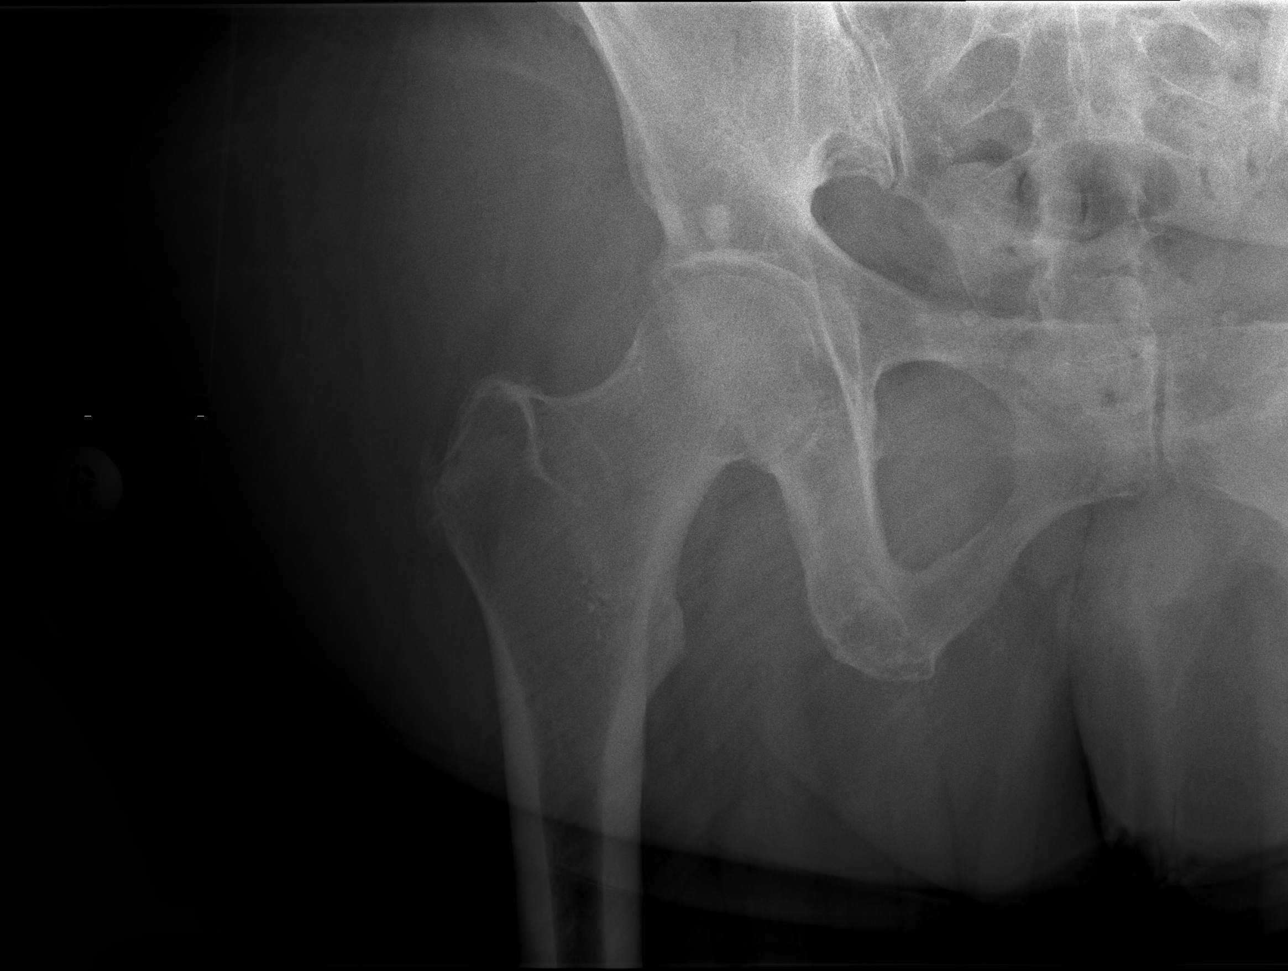

[w hip lat right]
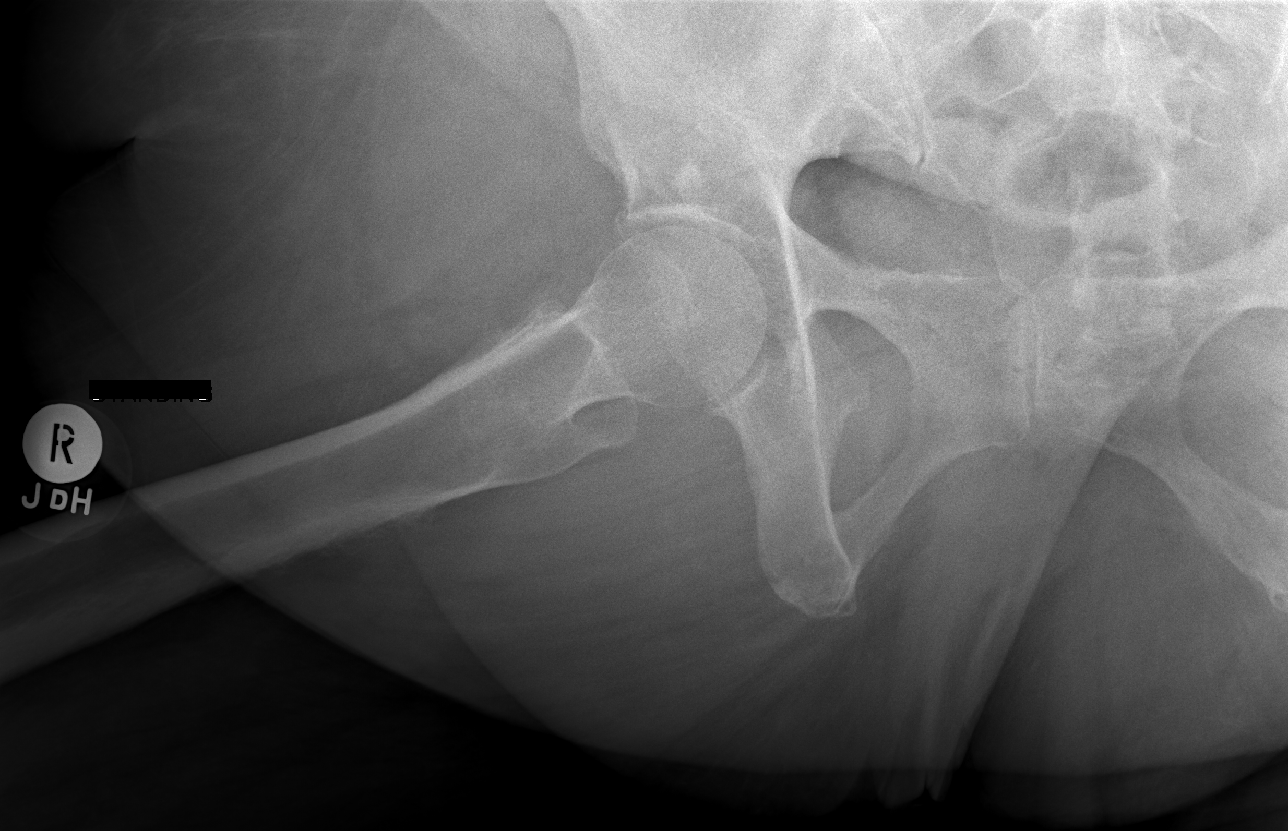

[4 of 4 positions shown; findings below may reference images not displayed]

FINDINGS: There is no evidence of hip fracture or dislocation. There is no
evidence of arthropathy or other focal bone abnormality.
IMPRESSION: Normal bilateral hips.

## 2018-01-23 MED ORDER — CLINDAMYCIN HCL 300 MG PO CAPS: 300.0000 mg | ORAL_CAPSULE | Freq: Three times a day (TID) | ORAL | 0 refills | Status: DC

## 2018-01-23 NOTE — Progress Notes (Signed)
Subjective: 52 year old female presents the office if new concerns of a wound to the left fourth toe as well as the ball of the right foot.  Glucose treated for previously have resolved.  She has noticed over the last day that she the left fourth toe has become swollen, red and the wound is formed on the top of the toe as well.  She is concerned because she thinks the toe needs to be amputated.  The wound on the right foot is been doing well and stable there is been ongoing for about 1 week.  Unsure how either with the wound started. Denies any systemic complaints such as fevers, chills, nausea, vomiting. No acute changes since last appointment, and no other complaints at this time.   Objective: AAO x3, NAD DP/PT pulses palpable bilaterally, CRT less than 3 seconds The left fourth toe the entire toe is edematous and there is erythema to the entire toe.  There is a wound of the dorsal PIPJ which is appropriately down to bone.  There is no drainage or pus identified at this time and there is no fluctuation or crepitation there is no ascending cellulitis.  There is no malodor. Small pre-ulcerative wound on the dorsal lateral aspect of the fifth toe.  Almost appears as if there was a blister to this area.  There is no external erythema, ascending cellulitis or drainage or pus. On the right foot just lateral to the first metatarsal head there is a small superficial granular wound measuring 0.7 x 0.4 cm there is no probing, undermining or tunneling.  No signs of infection. No open lesions or pre-ulcerative lesions.  No pain with calf compression, swelling, warmth, erythema  Assessment: Cellulitis, osteomyelitis left fourth toe ; stable ulceration right foot   Plan: -All treatment options discussed with the patient including all alternatives, risks, complications.  -X-rays were obtained reviewed.  Radiolucency in the proximal phalanx of the fourth toe concerning for osteomyelitis. -I debrided the left  fourth toe without any complications of bleeding as there was some epidermal lysis of the toe.  The wound did probe tracking on the bone.  Debrided the wound lightly with a #312 blade scalpel. -Regards to the left fourth toe we had a long discussion regards to treatment options.  This point she wants to go ahead at the toe amputated and she states that she knew that coming to the office today to be amputated.  We discussed the surgery as well as postoperative course.  We will plan on doing this next week.  I will check blood work including ESR, CRP, CBC.  Also start clindamycin.  Wound culture obtained. -The incision placement as well as the postoperative course was discussed with the patient. I discussed risks of the surgery which include, but not limited to, infection, bleeding, pain, swelling, need for further surgery, delayed or nonhealing, painful or ugly scar, numbness or sensation changes, over/under correction, recurrence, transfer lesions, further deformity, hardware failure, DVT/PE, loss of toe/foot. Patient understands these risks and wishes to proceed with surgery. The surgical consent was reviewed with the patient all 3 pages were signed. No promises or guarantees were given to the outcome of the procedure. All questions were answered to the best of my ability. Before the surgery the patient was encouraged to call the office if there is any further questions. The surgery will be performed at the [facility] on an outpatient basis. -Continue Intermedic ointment and a bandage on the right foot daily.  As well  as the left fifth toe. -I will see her back on Tuesday prior to surgery. -Patient encouraged to call the office with any questions, concerns, change in symptoms.

## 2018-01-23 NOTE — Patient Instructions (Signed)
Pre-Operative Instructions  Congratulations, you have decided to take an important step towards improving your quality of life.  You can be assured that the doctors and staff at [facility] will be with you every step of the way.  Here are some important things you should know:  1. Plan to be at the surgery center/hospital at least 1 (one) hour prior to your scheduled time, unless otherwise directed by the surgical center/hospital staff.  You must have a responsible adult accompany you, remain during the surgery and drive you home.  Make sure you have directions to the surgical center/hospital to ensure you arrive on time. 2. If you are having surgery at [facility] or [facility], you will need a copy of your medical history and physical form from your family physician within one month prior to the date of surgery. We will give you a form for your primary physician to complete.  3. We make every effort to accommodate the date you request for surgery.  However, there are times where surgery dates or times have to be moved.  We will contact you as soon as possible if a change in schedule is required.   4. No aspirin/ibuprofen for one week before surgery.  If you are on aspirin, any non-steroidal anti-inflammatory medications (Mobic, Aleve, Ibuprofen) should not be taken seven (7) days prior to your surgery.  You make take Tylenol for pain prior to surgery.  5. Medications - If you are taking daily heart and blood pressure medications, seizure, reflux, allergy, asthma, anxiety, pain or diabetes medications, make sure you notify the surgery center/hospital before the day of surgery so they can tell you which medications you should take or avoid the day of surgery. 6. No food or drink after midnight the night before surgery unless directed otherwise by surgical center/hospital staff. 7. No alcoholic beverages 24-hours prior to surgery.  No smoking 24-hours prior or 24-hours after  surgery. 8. Wear loose pants or shorts. They should be loose enough to fit over bandages, boots, and casts. 9. Don't wear slip-on shoes. Sneakers are preferred. 10. Bring your boot with you to the surgery center/hospital.  Also bring crutches or a walker if your physician has prescribed it for you.  If you do not have this equipment, it will be provided for you after surgery. 11. If you have not been contacted by the surgery center/hospital by the day before your surgery, call to confirm the date and time of your surgery. 12. Leave-time from work may vary depending on the type of surgery you have.  Appropriate arrangements should be made prior to surgery with your employer. 13. Prescriptions will be provided immediately following surgery by your doctor.  Fill these as soon as possible after surgery and take the medication as directed. Pain medications will not be refilled on weekends and must be approved by the doctor. 14. Remove nail polish on the operative foot and avoid getting pedicures prior to surgery. 15. Wash the night before surgery.  The night before surgery wash the foot and leg well with water and the antibacterial soap provided. Be sure to pay special attention to beneath the toenails and in between the toes.  Wash for at least three (3) minutes. Rinse thoroughly with water and dry well with a towel.  Perform this wash unless told not to do so by your physician.  Enclosed: 1 Ice pack (please put in freezer the night before surgery)   1 Hibiclens skin cleaner     Pre-op instructions  If you have any questions regarding the instructions, please do not hesitate to call our office.  [place]: [address], [place], [place] [address] -- [phone]  [place]: [address] [place], [place] [address] -- [phone]  [place]: [address]  [place], [place] [address] -- [phone]  [place]: [address], [address], [place], [place] [address] -- [phone]  Website:  [email or web address] 

## 2018-01-24 ENCOUNTER — Encounter: Payer: Self-pay | Admitting: Podiatry

## 2018-01-24 ENCOUNTER — Other Ambulatory Visit: Payer: Self-pay | Admitting: Podiatry

## 2018-01-24 ENCOUNTER — Telehealth: Payer: Self-pay | Admitting: *Deleted

## 2018-01-24 ENCOUNTER — Ambulatory Visit: Payer: 59 | Admitting: Podiatry

## 2018-01-24 DIAGNOSIS — L97529 Non-pressure chronic ulcer of other part of left foot with unspecified severity: Secondary | ICD-10-CM

## 2018-01-24 LAB — COMPLETE METABOLIC PANEL WITH GFR
AG Ratio: 1.4 (calc) (ref 1.0–2.5)
ALT: 13 U/L (ref 6–29)
AST: 15 U/L (ref 10–35)
Albumin: 3.7 g/dL (ref 3.6–5.1)
Alkaline phosphatase (APISO): 65 U/L (ref 33–130)
BUN: 10 mg/dL (ref 7–25)
CO2: 26 mmol/L (ref 20–32)
Calcium: 8.9 mg/dL (ref 8.6–10.4)
Chloride: 105 mmol/L (ref 98–110)
Creat: 0.57 mg/dL (ref 0.50–1.05)
GFR, Est African American: 122 mL/min/{1.73_m2} (ref >=60)
GFR, Est Non African American: 105 mL/min/{1.73_m2} (ref >=60)
Globulin: 2.6 g/dL (calc) (ref 1.9–3.7)
Glucose, Bld: 109 mg/dL (ref 65–139)
Potassium: 3.5 mmol/L (ref 3.5–5.3)
Sodium: 139 mmol/L (ref 135–146)
Total Bilirubin: 0.3 mg/dL (ref 0.2–1.2)
Total Protein: 6.3 g/dL (ref 6.1–8.1)

## 2018-01-24 LAB — CBC WITH DIFFERENTIAL/PLATELET
Basophils Absolute: 19 cells/uL (ref 0–200)
Basophils Relative: 0.3 %
Eosinophils Absolute: 141 cells/uL (ref 15–500)
Eosinophils Relative: 2.2 %
HCT: 34.2 % — ABNORMAL LOW (ref 35.0–45.0)
Hemoglobin: 11.6 g/dL — ABNORMAL LOW (ref 11.7–15.5)
Lymphs Abs: 1850 cells/uL (ref 850–3900)
MCH: 32.4 pg (ref 27.0–33.0)
MCHC: 33.9 g/dL (ref 32.0–36.0)
MCV: 95.5 fL (ref 80.0–100.0)
MPV: 9.6 fL (ref 7.5–12.5)
Monocytes Relative: 5.7 %
Neutro Abs: 4026 cells/uL (ref 1500–7800)
Neutrophils Relative %: 62.9 %
Platelets: 305 10*3/uL (ref 140–400)
RBC: 3.58 10*6/uL — ABNORMAL LOW (ref 3.80–5.10)
RDW: 12.5 % (ref 11.0–15.0)
Total Lymphocyte: 28.9 %
WBC mixed population: 365 cells/uL (ref 200–950)
WBC: 6.4 10*3/uL (ref 3.8–10.8)

## 2018-01-24 LAB — C-REACTIVE PROTEIN: CRP: 34.4 mg/L — ABNORMAL HIGH (ref <8.0)

## 2018-01-24 LAB — SEDIMENTATION RATE: Sed Rate: 43 mm/h — ABNORMAL HIGH (ref 0–30)

## 2018-01-24 MED ORDER — PROMETHAZINE HCL 25 MG PO TABS: 25.0000 mg | ORAL_TABLET | Freq: Three times a day (TID) | ORAL | 0 refills | Status: DC | PRN

## 2018-01-24 NOTE — Telephone Encounter (Signed)
I sent it to the pharmacy that was listed in the computer.

## 2018-01-24 NOTE — Progress Notes (Signed)
Phenergan sent to pharmacy

## 2018-01-24 NOTE — Telephone Encounter (Signed)
Left message for pt to call to discuss review of results.

## 2018-01-24 NOTE — Telephone Encounter (Signed)
Pt called and informed of Dr. Leigh Aurora review of results, and that I would have to ask for a rx for the phenergan.

## 2018-01-24 NOTE — Telephone Encounter (Signed)
-----   Message from [name], DPM sent at 01/24/2018  8:54 AM EDT ----- Inflammation markers elevated but WBC stable and kidney function stable.

## 2018-01-25 NOTE — Telephone Encounter (Signed)
Left message informing pt Dr. Jacqualyn Posey had called in the phenergan rx.

## 2018-01-26 LAB — WOUND CULTURE
MICRO NUMBER:: 91202330
SPECIMEN QUALITY:: ADEQUATE

## 2018-01-31 ENCOUNTER — Other Ambulatory Visit: Payer: Self-pay

## 2018-01-31 ENCOUNTER — Emergency Department (HOSPITAL_COMMUNITY): Payer: 59

## 2018-01-31 ENCOUNTER — Encounter: Payer: Self-pay | Admitting: Podiatry

## 2018-01-31 ENCOUNTER — Encounter (HOSPITAL_COMMUNITY): Payer: Self-pay | Admitting: Emergency Medicine

## 2018-01-31 ENCOUNTER — Inpatient Hospital Stay (HOSPITAL_COMMUNITY)
Admission: EM | Admit: 2018-01-31 | Discharge: 2018-02-03 | DRG: 580 | Disposition: A | Discharge status: Home / Self Care | Payer: 59 | Attending: Internal Medicine | Admitting: Internal Medicine

## 2018-01-31 ENCOUNTER — Inpatient Hospital Stay (HOSPITAL_COMMUNITY): Payer: 59

## 2018-01-31 ENCOUNTER — Other Ambulatory Visit: Payer: 59

## 2018-01-31 ENCOUNTER — Telehealth: Payer: Self-pay | Admitting: *Deleted

## 2018-01-31 DIAGNOSIS — M86672 Other chronic osteomyelitis, left ankle and foot: Secondary | ICD-10-CM | POA: Diagnosis not present

## 2018-01-31 DIAGNOSIS — M48061 Spinal stenosis, lumbar region without neurogenic claudication: Secondary | ICD-10-CM | POA: Diagnosis present

## 2018-01-31 DIAGNOSIS — M869 Osteomyelitis, unspecified: Secondary | ICD-10-CM | POA: Diagnosis not present

## 2018-01-31 DIAGNOSIS — Z833 Family history of diabetes mellitus: Secondary | ICD-10-CM

## 2018-01-31 DIAGNOSIS — Z881 Allergy status to other antibiotic agents status: Secondary | ICD-10-CM

## 2018-01-31 DIAGNOSIS — Z885 Allergy status to narcotic agent status: Secondary | ICD-10-CM

## 2018-01-31 DIAGNOSIS — R739 Hyperglycemia, unspecified: Secondary | ICD-10-CM | POA: Diagnosis present

## 2018-01-31 DIAGNOSIS — L03032 Cellulitis of left toe: Secondary | ICD-10-CM | POA: Diagnosis not present

## 2018-01-31 DIAGNOSIS — Z89421 Acquired absence of other right toe(s): Secondary | ICD-10-CM

## 2018-01-31 DIAGNOSIS — L97519 Non-pressure chronic ulcer of other part of right foot with unspecified severity: Secondary | ICD-10-CM | POA: Diagnosis not present

## 2018-01-31 DIAGNOSIS — G4733 Obstructive sleep apnea (adult) (pediatric): Secondary | ICD-10-CM | POA: Diagnosis not present

## 2018-01-31 DIAGNOSIS — Z825 Family history of asthma and other chronic lower respiratory diseases: Secondary | ICD-10-CM

## 2018-01-31 DIAGNOSIS — F319 Bipolar disorder, unspecified: Secondary | ICD-10-CM | POA: Diagnosis present

## 2018-01-31 DIAGNOSIS — I10 Essential (primary) hypertension: Secondary | ICD-10-CM | POA: Diagnosis present

## 2018-01-31 DIAGNOSIS — R569 Unspecified convulsions: Secondary | ICD-10-CM | POA: Diagnosis not present

## 2018-01-31 DIAGNOSIS — Z7952 Long term (current) use of systemic steroids: Secondary | ICD-10-CM

## 2018-01-31 DIAGNOSIS — G629 Polyneuropathy, unspecified: Secondary | ICD-10-CM | POA: Diagnosis present

## 2018-01-31 DIAGNOSIS — J4520 Mild intermittent asthma, uncomplicated: Secondary | ICD-10-CM | POA: Diagnosis present

## 2018-01-31 DIAGNOSIS — Z90710 Acquired absence of both cervix and uterus: Secondary | ICD-10-CM | POA: Diagnosis not present

## 2018-01-31 DIAGNOSIS — Z6839 Body mass index (BMI) 39.0-39.9, adult: Secondary | ICD-10-CM

## 2018-01-31 DIAGNOSIS — Z8051 Family history of malignant neoplasm of kidney: Secondary | ICD-10-CM

## 2018-01-31 DIAGNOSIS — E876 Hypokalemia: Secondary | ICD-10-CM | POA: Diagnosis present

## 2018-01-31 DIAGNOSIS — Z7951 Long term (current) use of inhaled steroids: Secondary | ICD-10-CM

## 2018-01-31 DIAGNOSIS — M5137 Other intervertebral disc degeneration, lumbosacral region: Secondary | ICD-10-CM | POA: Diagnosis present

## 2018-01-31 DIAGNOSIS — L0390 Cellulitis, unspecified: Secondary | ICD-10-CM

## 2018-01-31 DIAGNOSIS — Z880 Allergy status to penicillin: Secondary | ICD-10-CM

## 2018-01-31 DIAGNOSIS — Z79899 Other long term (current) drug therapy: Secondary | ICD-10-CM

## 2018-01-31 DIAGNOSIS — L03116 Cellulitis of left lower limb: Secondary | ICD-10-CM | POA: Diagnosis present

## 2018-01-31 DIAGNOSIS — G894 Chronic pain syndrome: Secondary | ICD-10-CM | POA: Diagnosis present

## 2018-01-31 DIAGNOSIS — L97529 Non-pressure chronic ulcer of other part of left foot with unspecified severity: Secondary | ICD-10-CM | POA: Diagnosis present

## 2018-01-31 DIAGNOSIS — Z791 Long term (current) use of non-steroidal anti-inflammatories (NSAID): Secondary | ICD-10-CM | POA: Diagnosis not present

## 2018-01-31 DIAGNOSIS — L97512 Non-pressure chronic ulcer of other part of right foot with fat layer exposed: Secondary | ICD-10-CM

## 2018-01-31 DIAGNOSIS — Z09 Encounter for follow-up examination after completed treatment for conditions other than malignant neoplasm: Secondary | ICD-10-CM

## 2018-01-31 DIAGNOSIS — G4730 Sleep apnea, unspecified: Secondary | ICD-10-CM | POA: Diagnosis present

## 2018-01-31 DIAGNOSIS — R7303 Prediabetes: Secondary | ICD-10-CM | POA: Diagnosis not present

## 2018-01-31 DIAGNOSIS — L97509 Non-pressure chronic ulcer of other part of unspecified foot with unspecified severity: Secondary | ICD-10-CM | POA: Diagnosis present

## 2018-01-31 DIAGNOSIS — K219 Gastro-esophageal reflux disease without esophagitis: Secondary | ICD-10-CM | POA: Diagnosis present

## 2018-01-31 DIAGNOSIS — Z888 Allergy status to other drugs, medicaments and biological substances status: Secondary | ICD-10-CM

## 2018-01-31 LAB — CBC WITH DIFFERENTIAL/PLATELET
Abs Immature Granulocytes: 0.03 10*3/uL (ref 0.00–0.07)
Basophils Absolute: 0 10*3/uL (ref 0.0–0.1)
Basophils Relative: 1 %
Eosinophils Absolute: 0.1 10*3/uL (ref 0.0–0.5)
Eosinophils Relative: 2 %
HCT: 38.9 % (ref 36.0–46.0)
Hemoglobin: 12.6 g/dL (ref 12.0–15.0)
Immature Granulocytes: 0 %
Lymphocytes Relative: 40 %
Lymphs Abs: 2.8 10*3/uL (ref 0.7–4.0)
MCH: 31.6 pg (ref 26.0–34.0)
MCHC: 32.4 g/dL (ref 30.0–36.0)
MCV: 97.5 fL (ref 80.0–100.0)
Monocytes Absolute: 0.5 10*3/uL (ref 0.1–1.0)
Monocytes Relative: 6 %
Neutro Abs: 3.7 10*3/uL (ref 1.7–7.7)
Neutrophils Relative %: 51 %
Platelets: 324 10*3/uL (ref 150–400)
RBC: 3.99 MIL/uL (ref 3.87–5.11)
RDW: 13 % (ref 11.5–15.5)
WBC: 7.2 10*3/uL (ref 4.0–10.5)
nRBC: 0 % (ref 0.0–0.2)

## 2018-01-31 LAB — URINALYSIS, ROUTINE W REFLEX MICROSCOPIC
Bacteria, UA: NONE SEEN
Bilirubin Urine: NEGATIVE
Glucose, UA: NEGATIVE mg/dL
Hgb urine dipstick: NEGATIVE
Ketones, ur: NEGATIVE mg/dL
Leukocytes, UA: NEGATIVE
Nitrite: NEGATIVE
Protein, ur: NEGATIVE mg/dL
Specific Gravity, Urine: 1.015 (ref 1.005–1.030)
pH: 5 (ref 5.0–8.0)

## 2018-01-31 LAB — COMPREHENSIVE METABOLIC PANEL
ALT: 15 U/L (ref 0–44)
AST: 14 U/L — ABNORMAL LOW (ref 15–41)
Albumin: 3.6 g/dL (ref 3.5–5.0)
Alkaline Phosphatase: 66 U/L (ref 38–126)
Anion gap: 9 (ref 5–15)
BUN: 10 mg/dL (ref 6–20)
CO2: 24 mmol/L (ref 22–32)
Calcium: 9.3 mg/dL (ref 8.9–10.3)
Chloride: 105 mmol/L (ref 98–111)
Creatinine, Ser: 0.69 mg/dL (ref 0.44–1.00)
GFR calc Af Amer: >60 mL/min (ref >60)
GFR calc non Af Amer: >60 mL/min (ref >60)
Glucose, Bld: 112 mg/dL — ABNORMAL HIGH (ref 70–99)
Potassium: 3.3 mmol/L — ABNORMAL LOW (ref 3.5–5.1)
Sodium: 138 mmol/L (ref 135–145)
Total Bilirubin: 0.3 mg/dL (ref 0.3–1.2)
Total Protein: 7.3 g/dL (ref 6.5–8.1)

## 2018-01-31 LAB — I-STAT CG4 LACTIC ACID, ED
Lactic Acid, Venous: 1.31 mmol/L (ref 0.5–1.9)
Lactic Acid, Venous: 1.63 mmol/L (ref 0.5–1.9)

## 2018-01-31 LAB — HEMOGLOBIN A1C
Hgb A1c MFr Bld: 5.3 % (ref 4.8–5.6)
Mean Plasma Glucose: 105.41 mg/dL

## 2018-01-31 LAB — GLUCOSE, CAPILLARY
Glucose-Capillary: 93 mg/dL (ref 70–99)
Glucose-Capillary: 89 mg/dL (ref 70–99)
Glucose-Capillary: 110 mg/dL — ABNORMAL HIGH (ref 70–99)

## 2018-01-31 LAB — C-REACTIVE PROTEIN: CRP: 1.4 mg/dL — ABNORMAL HIGH (ref <1.0)

## 2018-01-31 LAB — SEDIMENTATION RATE: Sed Rate: 30 mm/hr — ABNORMAL HIGH (ref 0–22)

## 2018-01-31 LAB — PREALBUMIN: Prealbumin: 22.9 mg/dL (ref 18–38)

## 2018-01-31 MED ORDER — ONDANSETRON HCL 4 MG/2ML IJ SOLN: 4.0000 mg | Freq: Three times a day (TID) | INTRAMUSCULAR | Status: DC | PRN

## 2018-01-31 MED ORDER — MUPIROCIN 2 % EX OINT
TOPICAL_OINTMENT | Freq: Every day | CUTANEOUS | Status: DC
  Administered 2018-01-31 – 2018-02-03 (×4): via TOPICAL
  Filled 2018-01-31: qty 22

## 2018-01-31 MED ORDER — ACETAMINOPHEN 325 MG PO TABS
650.0000 mg | ORAL_TABLET | Freq: Four times a day (QID) | ORAL | Status: DC | PRN
  Administered 2018-01-31 – 2018-02-03 (×2): 650 mg via ORAL
  Filled 2018-01-31 (×2): qty 2

## 2018-01-31 MED ORDER — ONDANSETRON HCL 4 MG/2ML IJ SOLN
4.0000 mg | Freq: Four times a day (QID) | INTRAMUSCULAR | Status: DC | PRN
  Administered 2018-01-31 – 2018-02-02 (×4): 4 mg via INTRAVENOUS
  Filled 2018-01-31 (×4): qty 2

## 2018-01-31 MED ORDER — CITALOPRAM HYDROBROMIDE 20 MG PO TABS
40.0000 mg | ORAL_TABLET | Freq: Every day | ORAL | Status: DC
  Administered 2018-01-31 – 2018-02-02 (×3): 40 mg via ORAL
  Filled 2018-01-31 (×3): qty 2

## 2018-01-31 MED ORDER — TOPIRAMATE 25 MG PO TABS
50.0000 mg | ORAL_TABLET | Freq: Two times a day (BID) | ORAL | Status: DC
  Administered 2018-01-31 – 2018-02-03 (×6): 50 mg via ORAL
  Filled 2018-01-31 (×8): qty 2

## 2018-01-31 MED ORDER — OXYCODONE-ACETAMINOPHEN 10-325 MG PO TABS: 1.0000 | ORAL_TABLET | Freq: Three times a day (TID) | ORAL | Status: DC

## 2018-01-31 MED ORDER — ACETAMINOPHEN 325 MG PO TABS: 650.0000 mg | ORAL_TABLET | Freq: Four times a day (QID) | ORAL | Status: DC | PRN

## 2018-01-31 MED ORDER — ESTRADIOL 1 MG PO TABS
1.0000 mg | ORAL_TABLET | Freq: Every day | ORAL | Status: DC
  Administered 2018-01-31 – 2018-02-03 (×4): 1 mg via ORAL
  Filled 2018-01-31 (×4): qty 1

## 2018-01-31 MED ORDER — LACTATED RINGERS IV SOLN
INTRAVENOUS | Status: DC
  Administered 2018-01-31 – 2018-02-03 (×2): via INTRAVENOUS

## 2018-01-31 MED ORDER — VANCOMYCIN HCL 10 G IV SOLR
1500.0000 mg | Freq: Once | INTRAVENOUS | Status: AC
  Administered 2018-01-31: 1500 mg via INTRAVENOUS
  Filled 2018-01-31 (×2): qty 1500

## 2018-01-31 MED ORDER — ACETAMINOPHEN 650 MG RE SUPP: 650.0000 mg | Freq: Four times a day (QID) | RECTAL | Status: DC | PRN

## 2018-01-31 MED ORDER — ZOLPIDEM TARTRATE 5 MG PO TABS: 5.0000 mg | ORAL_TABLET | Freq: Every evening | ORAL | Status: DC | PRN

## 2018-01-31 MED ORDER — OXYCODONE-ACETAMINOPHEN 5-325 MG PO TABS
1.0000 | ORAL_TABLET | Freq: Three times a day (TID) | ORAL | Status: DC
  Administered 2018-01-31 – 2018-02-03 (×8): 1 via ORAL
  Filled 2018-01-31 (×8): qty 1

## 2018-01-31 MED ORDER — ONDANSETRON HCL 4 MG PO TABS
4.0000 mg | ORAL_TABLET | Freq: Four times a day (QID) | ORAL | Status: DC | PRN
  Administered 2018-02-01: 4 mg via ORAL
  Filled 2018-01-31: qty 1

## 2018-01-31 MED ORDER — MORPHINE SULFATE (PF) 2 MG/ML IV SOLN: 2.0000 mg | INTRAVENOUS | Status: DC | PRN

## 2018-01-31 MED ORDER — MORPHINE SULFATE ER 15 MG PO TBCR
30.0000 mg | EXTENDED_RELEASE_TABLET | Freq: Two times a day (BID) | ORAL | Status: DC
  Administered 2018-01-31 – 2018-02-03 (×7): 30 mg via ORAL
  Filled 2018-01-31 (×7): qty 2

## 2018-01-31 MED ORDER — TRAZODONE HCL 100 MG PO TABS
100.0000 mg | ORAL_TABLET | Freq: Every day | ORAL | Status: DC
  Administered 2018-01-31 – 2018-02-02 (×3): 100 mg via ORAL
  Filled 2018-01-31 (×3): qty 1

## 2018-01-31 MED ORDER — VANCOMYCIN HCL 10 G IV SOLR
1250.0000 mg | Freq: Two times a day (BID) | INTRAVENOUS | Status: DC
  Administered 2018-01-31 – 2018-02-03 (×7): 1250 mg via INTRAVENOUS
  Filled 2018-01-31 (×9): qty 1250

## 2018-01-31 MED ORDER — LINACLOTIDE 145 MCG PO CAPS
290.0000 ug | ORAL_CAPSULE | Freq: Every day | ORAL | Status: DC
  Administered 2018-02-01 – 2018-02-03 (×2): 290 ug via ORAL
  Filled 2018-01-31 (×4): qty 2

## 2018-01-31 MED ORDER — GABAPENTIN 300 MG PO CAPS
600.0000 mg | ORAL_CAPSULE | Freq: Four times a day (QID) | ORAL | Status: DC
  Administered 2018-01-31 – 2018-02-03 (×13): 600 mg via ORAL
  Filled 2018-01-31 (×13): qty 2

## 2018-01-31 MED ORDER — LORATADINE 10 MG PO TABS
10.0000 mg | ORAL_TABLET | Freq: Every evening | ORAL | Status: DC
  Administered 2018-01-31 – 2018-02-03 (×4): 10 mg via ORAL
  Filled 2018-01-31 (×4): qty 1

## 2018-01-31 MED ORDER — INSULIN ASPART 100 UNIT/ML ~~LOC~~ SOLN: 0.0000 [IU] | Freq: Three times a day (TID) | SUBCUTANEOUS | Status: DC

## 2018-01-31 MED ORDER — METRONIDAZOLE IN NACL 5-0.79 MG/ML-% IV SOLN
500.0000 mg | Freq: Three times a day (TID) | INTRAVENOUS | Status: DC
  Administered 2018-01-31 – 2018-02-03 (×10): 500 mg via INTRAVENOUS
  Filled 2018-01-31 (×12): qty 100

## 2018-01-31 MED ORDER — MORPHINE-NALTREXONE 30-1.2 MG PO CPCR: 1.0000 | ORAL_CAPSULE | Freq: Two times a day (BID) | ORAL | Status: DC

## 2018-01-31 MED ORDER — BUSPIRONE HCL 15 MG PO TABS
15.0000 mg | ORAL_TABLET | Freq: Three times a day (TID) | ORAL | Status: DC
  Administered 2018-01-31 – 2018-02-03 (×10): 15 mg via ORAL
  Filled 2018-01-31 (×10): qty 1

## 2018-01-31 MED ORDER — ALBUTEROL SULFATE (2.5 MG/3ML) 0.083% IN NEBU: 2.5000 mg | INHALATION_SOLUTION | RESPIRATORY_TRACT | Status: DC | PRN

## 2018-01-31 MED ORDER — SODIUM CHLORIDE 0.9 % IV SOLN
2.0000 g | INTRAVENOUS | Status: DC
  Administered 2018-01-31 – 2018-02-03 (×4): 2 g via INTRAVENOUS
  Filled 2018-01-31 (×4): qty 20

## 2018-01-31 MED ORDER — OXYCODONE HCL 5 MG PO TABS
5.0000 mg | ORAL_TABLET | Freq: Three times a day (TID) | ORAL | Status: DC
  Administered 2018-01-31 – 2018-02-03 (×8): 5 mg via ORAL
  Filled 2018-01-31 (×8): qty 1

## 2018-01-31 MED ORDER — HALOPERIDOL 5 MG PO TABS
5.0000 mg | ORAL_TABLET | Freq: Every day | ORAL | Status: DC
  Administered 2018-01-31 – 2018-02-02 (×3): 5 mg via ORAL
  Filled 2018-01-31 (×4): qty 1

## 2018-01-31 MED ORDER — METOPROLOL SUCCINATE ER 25 MG PO TB24
12.5000 mg | ORAL_TABLET | Freq: Every day | ORAL | Status: DC
  Administered 2018-01-31 – 2018-02-02 (×3): 12.5 mg via ORAL
  Filled 2018-01-31 (×3): qty 1

## 2018-01-31 MED ORDER — DOCUSATE SODIUM 100 MG PO CAPS
100.0000 mg | ORAL_CAPSULE | Freq: Two times a day (BID) | ORAL | Status: DC
  Administered 2018-01-31 – 2018-02-03 (×7): 100 mg via ORAL
  Filled 2018-01-31 (×7): qty 1

## 2018-01-31 MED ORDER — HEPARIN SODIUM (PORCINE) 5000 UNIT/ML IJ SOLN
5000.0000 [IU] | Freq: Three times a day (TID) | INTRAMUSCULAR | Status: DC
  Administered 2018-01-31 – 2018-02-01 (×3): 5000 [IU] via SUBCUTANEOUS
  Filled 2018-01-31 (×3): qty 1

## 2018-01-31 MED ORDER — POTASSIUM CHLORIDE CRYS ER 20 MEQ PO TBCR
20.0000 meq | EXTENDED_RELEASE_TABLET | Freq: Two times a day (BID) | ORAL | Status: DC
  Administered 2018-01-31 – 2018-02-01 (×4): 20 meq via ORAL
  Filled 2018-01-31 (×4): qty 1

## 2018-01-31 MED ORDER — PANTOPRAZOLE SODIUM 40 MG PO TBEC
40.0000 mg | DELAYED_RELEASE_TABLET | Freq: Two times a day (BID) | ORAL | Status: DC
  Administered 2018-01-31 – 2018-02-03 (×7): 40 mg via ORAL
  Filled 2018-01-31 (×7): qty 1

## 2018-01-31 MED ORDER — HYDROCHLOROTHIAZIDE 25 MG PO TABS
25.0000 mg | ORAL_TABLET | Freq: Every day | ORAL | Status: DC
  Administered 2018-01-31 – 2018-02-02 (×3): 25 mg via ORAL
  Filled 2018-01-31 (×3): qty 1

## 2018-01-31 MED ORDER — LEVOCETIRIZINE DIHYDROCHLORIDE 5 MG PO TABS: 5.0000 mg | ORAL_TABLET | Freq: Every evening | ORAL | Status: DC

## 2018-01-31 MED ORDER — CYCLOBENZAPRINE HCL 10 MG PO TABS
10.0000 mg | ORAL_TABLET | Freq: Two times a day (BID) | ORAL | Status: DC | PRN
  Administered 2018-02-03: 10 mg via ORAL
  Filled 2018-01-31: qty 1

## 2018-01-31 MED ORDER — HYDRALAZINE HCL 20 MG/ML IJ SOLN: 5.0000 mg | INTRAMUSCULAR | Status: DC | PRN

## 2018-01-31 NOTE — Progress Notes (Signed)
CSW acknowledges consult for accessing medications at the time of discharge. For further assistance with this matter please consult RNCM. At this time there are no further CSW needs. CSW will sign off. If new need arises please reconsult.    Virgie Dad Jorey Dollard, MSW, [facility] Clinical Social Worker [phone]

## 2018-01-31 NOTE — Progress Notes (Signed)
Pharmacy Antibiotic Note  [name] [name] is a 52 y.o. female admitted on 01/31/2018 with L foot cellulitis.  Pharmacy has been consulted for Vancomycin  Dosing.  Vancomycin 1500 mg IV given in ED at 0500  Plan: Vancomycin 1250 mg IV q12h  Height: 5\' 4"  (162.6 cm) Weight: 230 lb (104.3 kg) IBW/kg (Calculated) : 54.7  Temp (24hrs), Avg:98.5 F (36.9 C), Min:98 F (36.7 C), Max:99 F (37.2 C)  Recent Labs  Lab 01/31/18 0248 01/31/18 0255 01/31/18 0504  WBC 7.2  --   --   CREATININE 0.69  --   --   LATICACIDVEN  --  1.31 1.63    Estimated Creatinine Clearance: 94.5 mL/min (by C-G formula based on SCr of 0.69 mg/dL).    Allergies  Allergen Reactions  . Penicillins Hives and Rash    Has patient had a PCN reaction causing immediate rash, facial/tongue/throat swelling, SOB or lightheadedness with hypotension: Yes Has patient had a PCN reaction causing severe rash involving mucus membranes or skin necrosis: No Has patient had a PCN reaction that required hospitalization: No Has patient had a PCN reaction occurring within the last 10 years: No If all of the above answers are "NO", then may proceed with Cephalosporin use.  . Tetracyclines & Related Other (See Comments)    Syncope and put her "in a coma"  . Tramadol Other (See Comments)    Seizures  . Phenazopyridine Other (See Comments)    Unknown  . Tetracycline Other (See Comments)    Syncope and "Put me in a coma"  . Ciprofloxacin Rash and Itching  . Codeine Itching and Rash  . Estradiol Rash    Patches broke out the skin   [name] [name] 01/31/2018 7:07 AM

## 2018-01-31 NOTE — Progress Notes (Signed)
Patient called and stated worsening redness of the toe, including redness of the adjacent toe including streaking up her foot. She has been on Clindamycin prescribed by Dr. Jacqualyn Posey.  Advised patient to proceed directly to the ER. She will need IV abx for failure of conservative therapy. She will need amputation of the digit as previously planned, perhaps more depending upon clinical picture. Dr. Jacqualyn Posey or I will be happy to evalu ate her upon presentation/admission.

## 2018-01-31 NOTE — ED Notes (Signed)
Report called to [facility] on 6N, patient transported via stretcher.

## 2018-01-31 NOTE — ED Notes (Signed)
1st attempt to call report to 6N, RN on floor will call back.

## 2018-01-31 NOTE — Progress Notes (Signed)
VASCULAR LAB PRELIMINARY  ARTERIAL  ABI completed:    RIGHT    LEFT    PRESSURE WAVEFORM  PRESSURE WAVEFORM  BRACHIAL 143 triphasic BRACHIAL 137 triphasic  DP 165 triphasic DP 152 triphasic         PT 170 triphasic PT 163 triphasic    RIGHT LEFT  ABI 1.19 1.14     Zoiey Christy E Jerzee Jerome 01/31/2018, 4:15 PM

## 2018-01-31 NOTE — Consult Note (Signed)
Reason for Consult: wound, cellulitis left foot Referring Physician: Dr. Karmen Bongo, MD  [name] [name] is an 52 y.o. female.  HPI: [name] [name] was admitted overnight for worsening cellulitis to her left foot.  She been on oral antibiotics and despite this she had progressive thickness of her infection and she was sent to the emergency room overnight where she was admitted.  She said that she started noticing redness to her left fifth toe last night which prompted her to go the emergency room.  She has been on clindamycin as an outpatient.  She is a history of a second toe amputation right foot.  Past Medical History:  Diagnosis Date  . Anxiety   . Asthma    mild intermittent  . Bipolar 1 disorder ([place])    ect treatments last treatment Sep 02 1011  . Depression   . GERD (gastroesophageal reflux disease)   . Hypertension   . Pre-diabetes   . Seizures ([place])    last seizure was so long ago she can't remember.   . Sleep apnea    wears CPAP, uncertain of setting    Past Surgical History:  Procedure Laterality Date  . ABDOMINAL HYSTERECTOMY    . BACK SURGERY    . CARPAL TUNNEL RELEASE     x2  . Laproscopic knee surgery    . NECK SURGERY  03/15/2017  . PLANTAR FASCIA RELEASE     x2    Family History  Problem Relation Age of Onset  . Diabetes Mother   . COPD Mother   . Hypertension Mother   . Hyperlipidemia Mother   . Heart disease Father   . Hyperlipidemia Father   . Hypertension Father   . Cancer Father   . Heart disease Brother   . Heart disease Daughter   . Heart disease Maternal Grandmother   . Hypertension Maternal Grandmother   . Heart disease Maternal Grandfather   . Kidney cancer Paternal Grandmother   . Heart disease Paternal Grandfather     Social History:  reports that she has never smoked. She has never used smokeless tobacco. She reports that she does not drink alcohol or use drugs.  Allergies:  Allergies  Allergen Reactions  . Penicillins Hives and  Rash    Has patient had a PCN reaction causing immediate rash, facial/tongue/throat swelling, SOB or lightheadedness with hypotension: Yes Has patient had a PCN reaction causing severe rash involving mucus membranes or skin necrosis: No Has patient had a PCN reaction that required hospitalization: No Has patient had a PCN reaction occurring within the last 10 years: No If all of the above answers are "NO", then may proceed with Cephalosporin use.  . Tetracyclines & Related Other (See Comments)    Syncope and put her "in a coma"  . Tramadol Other (See Comments)    Seizures  . Phenazopyridine Other (See Comments)    Unknown  . Tetracycline Other (See Comments)    Syncope and "Put me in a coma"  . Ciprofloxacin Rash and Itching  . Codeine Itching and Rash  . Estradiol Rash    Patches broke out the skin    Medications: I have reviewed the patient's current medications.  Results for orders placed or performed during the hospital encounter of 01/31/18 (from the past 48 hour(s))  Comprehensive metabolic panel     Status: Abnormal   Collection Time: 01/31/18  2:48 AM  Result Value Ref Range   Sodium 138 135 - 145 mmol/L  Potassium 3.3 (L) 3.5 - 5.1 mmol/L   Chloride 105 98 - 111 mmol/L   CO2 24 22 - 32 mmol/L   Glucose, Bld 112 (H) 70 - 99 mg/dL   BUN 10 6 - 20 mg/dL   Creatinine, Ser 0.69 0.44 - 1.00 mg/dL   Calcium 9.3 8.9 - 10.3 mg/dL   Total Protein 7.3 6.5 - 8.1 g/dL   Albumin 3.6 3.5 - 5.0 g/dL   AST 14 (L) 15 - 41 U/L   ALT 15 0 - 44 U/L   Alkaline Phosphatase 66 38 - 126 U/L   Total Bilirubin 0.3 0.3 - 1.2 mg/dL   GFR calc non Af Amer >60 >60 mL/min   GFR calc Af Amer >60 >60 mL/min    Comment: (NOTE) The eGFR has been calculated using the CKD EPI equation. This calculation has not been validated in all clinical situations. eGFR's persistently <60 mL/min signify possible Chronic Kidney Disease.    Anion gap 9 5 - 15    Comment: Performed at [facility] [address] [place], [place] [address]  CBC with Differential     Status: None   Collection Time: 01/31/18  2:48 AM  Result Value Ref Range   WBC 7.2 4.0 - 10.5 K/uL   RBC 3.99 3.87 - 5.11 MIL/uL   Hemoglobin 12.6 12.0 - 15.0 g/dL   HCT 38.9 36.0 - 46.0 %   MCV 97.5 80.0 - 100.0 fL   MCH 31.6 26.0 - 34.0 pg   MCHC 32.4 30.0 - 36.0 g/dL   RDW 13.0 11.5 - 15.5 %   Platelets 324 150 - 400 K/uL   nRBC 0.0 0.0 - 0.2 %   Neutrophils Relative % 51 %   Neutro Abs 3.7 1.7 - 7.7 K/uL   Lymphocytes Relative 40 %   Lymphs Abs 2.8 0.7 - 4.0 K/uL   Monocytes Relative 6 %   Monocytes Absolute 0.5 0.1 - 1.0 K/uL   Eosinophils Relative 2 %   Eosinophils Absolute 0.1 0.0 - 0.5 K/uL   Basophils Relative 1 %   Basophils Absolute 0.0 0.0 - 0.1 K/uL   Immature Granulocytes 0 %   Abs Immature Granulocytes 0.03 0.00 - 0.07 K/uL    Comment: Performed at [facility], [address] [place], [place] [address]  Urinalysis, Routine w reflex microscopic     Status: None   Collection Time: 01/31/18  2:48 AM  Result Value Ref Range   Color, Urine YELLOW YELLOW   APPearance CLEAR CLEAR   Specific Gravity, Urine 1.015 1.005 - 1.030   pH 5.0 5.0 - 8.0   Glucose, UA NEGATIVE NEGATIVE mg/dL   Hgb urine dipstick NEGATIVE NEGATIVE   Bilirubin Urine NEGATIVE NEGATIVE   Ketones, ur NEGATIVE NEGATIVE mg/dL   Protein, ur NEGATIVE NEGATIVE mg/dL   Nitrite NEGATIVE NEGATIVE   Leukocytes, UA NEGATIVE NEGATIVE   WBC, UA 0-5 0 - 5 WBC/hpf   Bacteria, UA NONE SEEN NONE SEEN   Squamous Epithelial / LPF 0-5 0 - 5   Mucus PRESENT     Comment: Performed at [facility], [place] [address] [place], [place] [address]  I-Stat CG4 Lactic Acid, ED     Status: None   Collection Time: 01/31/18  2:55 AM  Result Value Ref Range   Lactic Acid, Venous 1.31 0.5 - 1.9 mmol/L  I-Stat CG4 Lactic Acid, ED     Status: None   Collection Time: 01/31/18  5:04 AM  Result Value Ref Range   Lactic Acid, Venous 1.63 0.5 - 1.9  mmol/L  Hemoglobin A1c     Status: None   Collection Time: 01/31/18 11:15 AM  Result Value Ref Range   Hgb A1c MFr Bld 5.3 4.8 - 5.6 %    Comment: (NOTE) Pre diabetes:          5.7%-6.4% Diabetes:              >6.4% Glycemic control for   <7.0% adults with diabetes    Mean Plasma Glucose 105.41 mg/dL    Comment: Performed at [facility] [address], [place], [place] [address]  C-reactive protein     Status: Abnormal   Collection Time: 01/31/18 11:15 AM  Result Value Ref Range   CRP 1.4 (H) <1.0 mg/dL    Comment: Performed at [facility] [address] [place], [place] [address]  Prealbumin     Status: None   Collection Time: 01/31/18 11:15 AM  Result Value Ref Range   Prealbumin 22.9 18 - 38 mg/dL    Comment: Performed at [facility] [address], [place], [place] [address]  Glucose, capillary     Status: None   Collection Time: 01/31/18 12:31 PM  Result Value Ref Range   Glucose-Capillary 93 70 - 99 mg/dL    Dg Foot Complete Left  Result Date: 01/31/2018 CLINICAL DATA:  Patient reports infection to the bone in the fourth toe of the left foot spreading to the fifth toe. Streaking from the wound. Nausea and vomiting for 2 days. EXAM: LEFT FOOT - COMPLETE 3+ VIEW COMPARISON:  01/23/2018 FINDINGS: Old arthrodesis with screw fixation of the interphalangeal joint of the first toe. Hardware appears intact. No evidence of acute fracture or dislocation. Evaluation of the fourth and fifth toes is limited due to the positioning but no obvious bone sclerosis or cortical erosion is identified suggesting no evidence of significant osteomyelitis. Soft tissue swelling is seen over the forefoot region. Small calcaneal spurs. Mild degenerative changes in the intertarsal joints. IMPRESSION: Old postoperative changes in the first toe. Soft tissue swelling. No evidence of osteomyelitis. Electronically Signed   By: Lucienne Capers M.D.   On: 01/31/2018 05:57    ROS Blood  pressure 105/63, pulse 78, temperature 98.2 F (36.8 C), temperature source Oral, resp. rate 17, height '5\' 4"'$  (1.626 m), weight 104.3 kg, SpO2 100 %. Physical Exam General: AAO x3, NAD  Dermatological: On the dorsal left fourth PIPJ is an ulceration and mild surrounding hyperkeratotic tissue.  There is no probing to bone, undermining or tunneling.  Small amount of clear bloody drainage is expressed.  There is no purulence.  There is no fluctuation or crepitation.  Erythema to the fourth toe.  Along the area the left fifth toe she had almost a old blister to this area and there is new surrounding erythema to this area.  There is mild increase in edema of the left foot compared to contralateral extremity mild erythema to the dorsal foot.  No other open lesions identified to the left lower extremity.  On the right foot submetatarsal 2 area is a healing wound and there is no surrounding erythema, ascending sialitis.  There is no fluctuation or crepitation or any malodor.  No clinical signs of infection of the right foot.  Vascular: Dorsalis Pedis artery and Posterior Tibial artery pedal pulses are 2/4 bilateral with immedate capillary fill time. There is no  pain with calf compression, swelling, warmth, erythema.   Neurologic: absent sensation   Musculoskeletal: Hammertoes present    Assessment/Plan: Ulceration, cellulitis left foot -I did independently review the chart as well as x-rays.  We had a discussion regards to no findings of osteomyelitis findings on x-ray.  I also reviewed the previous wound cultures with her as well and she is currently on IV antibiotics.  We discussed amputation of the fourth toe.  However we are going to continue IV antibiotics given the recent increased cellulitis and will reassess tomorrow and potentially amputation of the fourth toe on Thursday. CRP is much improved compared to last week. Awaiting ESR. WBC normal.  -I did debride some of the hyperkeratotic tissue to left  fourth and fifth toes today without any complications. -Right foot ulceration stable.  Continue to monitor and continue local wound care. -Patient has pain over each of the areas followed by dry sterile dressing.  [name] 01/31/2018, 1:13 PM  O: [phone] C: [phone]

## 2018-01-31 NOTE — ED Provider Notes (Signed)
[facility] EMERGENCY DEPARTMENT Provider Note   CSN: [id number] Arrival date & time: 01/31/18  0224     History   Chief Complaint No chief complaint on file.   HPI [name] [name] is a 52 y.o. female.  Patient presents to the emergency department with a chief complaint of toe infection.  She states that she has been being treated for a left fourth toe infection by a podiatrist.  She has been taking clindamycin for the past 7 days.  She reports worsening redness and spreading of the infection into her left little toe.  She denies any fevers, but states that she has been nauseated and has had vomiting.  She denies pain, and states that she has neuropathy in her feet.  She is not diabetic.  She reports that her podiatrist told her to come to the emergency department for IV antibiotic therapy.  The history is provided by the patient. No language interpreter was used.    Past Medical History:  Diagnosis Date  . Anxiety   . Asthma   . Bipolar 1 disorder ([place])    ect treatments last treatment Sep 02 1011  . Depression   . GERD (gastroesophageal reflux disease)   . Hypertension   . Pre-diabetes   . Seizures ([place])    last seizure was so long ago she can't remember.   . Sleep apnea     Patient Active Problem List   Diagnosis Date Noted  . Left leg cellulitis 09/24/2017  . Cellulitis 09/24/2017  . Syncope and collapse   . Near syncope 11/23/2016  . Abdominal pain 09/23/2016  . Anxiety 09/23/2016  . 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[place]) 09/23/2016  . Constipation 09/23/2016  . DDD (degenerative disc disease), lumbosacral 09/23/2016  . Hypertension 09/23/2016  . Migraines 09/23/2016  . Myelopathy ([place]) 09/23/2016  . Nausea 09/23/2016  . Post laminectomy syndrome 09/23/2016  . Pseudoarthrosis of lumbar spine 09/23/2016  . Seizures ([place]) 09/23/2016  . Shortness of breath 09/23/2016  . Sleep apnea 09/23/2016  . Spinal stenosis of lumbar region  09/23/2016  . Vertigo 09/23/2016  . 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 Chronic pain syndrome   . Esophageal reflux   . Depression   . Rash 11/14/2015  . Cellulitis of great toe of right foot 08/12/2015  . Toe ulcer, right ([place]) 04/11/2015  . Cellulitis of toe of right foot 04/11/2015  . Nonspecific chest pain   . Chest pain 10/26/2014  . Obesity 10/26/2014  . Hypokalemia 10/26/2014  . Manic bipolar I disorder ([place]) 10/26/2014  . Atypical angina ([place]) 10/26/2014  . Tardive dyskinesia 08/26/2011  . Manic bipolar I disorder with rapid cycling ([place]) 05/18/2011    Class: Acute  . Asthma 02/24/2011  . Chronic pain 02/24/2011  . Essential hypertension 02/24/2011  . History of migraine headaches 02/24/2011    Past Surgical History:  Procedure Laterality Date  . ABDOMINAL HYSTERECTOMY    . BACK SURGERY    . CARPAL TUNNEL RELEASE     x2  . Laproscopic knee surgery    . NECK SURGERY  03/15/2017  . PLANTAR FASCIA RELEASE     x2     OB History    Gravida  3   Para  3   Term  3   Preterm      AB      Living  3     SAB      TAB      Ectopic  Multiple      Live Births  3            Home Medications    Prior to Admission medications   Medication Sig Start Date End Date Taking? Authorizing Provider  Artificial Tear Ointment (ULTRA FRESH PM) OINT Apply to eye.    [provider]  busPIRone (BUSPAR) 10 MG tablet Take 1 tablet by mouth three times a day For anxiety 04/26/17   [provider]  busPIRone (BUSPAR) 15 MG tablet TAKE ONE TABLET (15 MG) BY MOUTH THREE TIMES DAILY FOR ANXIETY 12/30/17   [provider]  Calcium Carb-Cholecalciferol 500-400 MG-UNIT CHEW Chew by mouth.    [provider]  Calcium Carbonate-Vit D-Min (CALCIUM 1200 PO) Take 1 tablet by mouth daily.    [provider]  citalopram (CELEXA) 40 MG tablet Take 40 mg by mouth at bedtime.  05/28/11   Darrol Jump, MD  clindamycin (CLEOCIN) 300 MG capsule  Take 1 capsule (300 mg total) by mouth 3 (three) times daily. 01/23/18   [name], DPM  clotrimazole (LOTRIMIN) 1 % cream Apply 1 application topically 2 (two) times daily. 04/21/17   Shelly Bombard, MD  cyclobenzaprine (FLEXERIL) 10 MG tablet Take 10 mg by mouth 2 (two) times daily as needed for muscle spasms.     [provider]  Dextran 70-Hypromellose, PF, (ARTIFICIAL TEARS PF) 0.1-0.3 % SOLN Place 1-2 drops into both eyes 3 (three) times daily as needed (for dry eyes).    [provider]  EMBEDA 30-1.2 MG CPCR Take 1 capsule by mouth 2 (two) times daily.  10/19/16   [provider]  estradiol (ESTRACE) 1 MG tablet Take 1 tablet (1 mg total) by mouth daily. 10/06/17   [name], MD  fluticasone (FLONASE) 50 MCG/ACT nasal spray Place 2 sprays into both nostrils daily as needed for allergies. 09/29/17   [name], MD  gabapentin (NEURONTIN) 600 MG tablet Take 1 tablet (600 mg total) by mouth 3 (three) times daily. For anxiety and pain management. May have to stop to take ECT. Patient taking differently: Take 600 mg by mouth 4 (four) times daily. For anxiety and pain management 05/28/11   Darrol Jump, MD  haloperidol (HALDOL) 5 MG tablet Take 1 tablet (5 mg total) by mouth at bedtime. For psychosis 11/15/15   Barton Dubois, MD  hydrochlorothiazide (HYDRODIURIL) 25 MG tablet TAKE ONE TABLET BY MOUTH DAILY 01/11/17   [name], MD  levocetirizine (XYZAL) 5 MG tablet TAKE ONE TABLET BY MOUTH EVERY EVENING 11/04/17   [name], MD  lidocaine (XYLOCAINE) 5 % ointment Apply 1-2 grams (1 gram = 1 inch) to the affected area 2-3 times daily for 30 days. 12/28/17   [provider]  LINZESS 290 MCG CAPS capsule TAKE ONE CAPSULE BY MOUTH DAILY BEFORE BREAKFAST 12/26/17   [name], MD  meloxicam (MOBIC) 15 MG tablet Take 15 mg by mouth daily. 09/15/17   [provider]  metoprolol succinate (TOPROL XL) 25 MG 24 hr tablet Take  0.5 tablets (12.5 mg total) by mouth daily. 11/01/17   [name], MD  Multiple Vitamins-Minerals (MULTIVITAMIN WITH MINERALS) tablet Take 1 tablet by mouth daily.    [provider]  oxyCODONE-acetaminophen (PERCOCET) 10-325 MG tablet Take 1 tablet by mouth 3 (three) times daily. 12/14/16   [provider]  pantoprazole (PROTONIX) 40 MG tablet TAKE ONE TABLET BY MOUTH TWICE DAILY 08/08/17  [name], MD  potassium chloride SA (KLOR-CON M20) 20 MEQ tablet Take 2 tablets (40 mEq total) by mouth daily. Patient taking differently: Take 20 mEq by mouth 2 (two) times daily.  11/02/16   [name], MD  predniSONE (DELTASONE) 10 MG tablet Take 1 tablet (10 mg total) by mouth daily. 09/26/17   Donne Hazel, MD  PROAIR HFA 108 [phone] Base) MCG/ACT inhaler Inhale 2 puffs = 123mcg into the lungs every 6 (six) hours as needed for wheezing or shortness of breath. 01/10/18   [name], MD  promethazine (PHENERGAN) 25 MG tablet Take 1 tablet (25 mg total) by mouth every 8 (eight) hours as needed for nausea or vomiting. 01/24/18   [name], DPM  topiramate (TOPAMAX) 50 MG tablet TAKE ONE TABLET BY MOUTH TWICE DAILY 12/26/17   [name], MD  traZODone (DESYREL) 100 MG tablet Take 100 mg by mouth at bedtime. 07/29/14   [provider]    Family History Family History  Problem Relation Age of Onset  . Diabetes Mother   . COPD Mother   . Hypertension Mother   . Hyperlipidemia Mother   . Heart disease Father   . Hyperlipidemia Father   . Hypertension Father   . Cancer Father   . Heart disease Brother   . Heart disease Daughter   . Heart disease Maternal Grandmother   . Hypertension Maternal Grandmother   . Heart disease Maternal Grandfather   . Kidney cancer Paternal Grandmother   . Heart disease Paternal Grandfather     Social History Social History   Tobacco Use  . Smoking status: Never Smoker  . Smokeless tobacco: Never Used    Substance Use Topics  . Alcohol use: No  . Drug use: No     Allergies   Penicillins; Tetracyclines & related; Tramadol; Phenazopyridine; Tetracycline; Ciprofloxacin; Codeine; and Estradiol   Review of Systems Review of Systems  All other systems reviewed and are negative.    Physical Exam Updated Vital Signs BP 140/84 (BP Location: Right Arm)   Pulse 100   Temp 99 F (37.2 C) (Oral)   Resp 18   Ht 5\' 4"  (1.626 m)   Wt 104.3 kg   SpO2 97%   BMI 39.48 kg/m   Physical Exam  Constitutional: She is oriented to person, place, and time. She appears well-developed and well-nourished.  HENT:  Head: Normocephalic and atraumatic.  Eyes: Conjunctivae and EOM are normal.  Neck: Normal range of motion.  Cardiovascular: Normal rate.  Pulmonary/Chest: Effort normal.  Abdominal: She exhibits no distension.  Musculoskeletal: Normal range of motion.  Neurological: She is alert and oriented to person, place, and time.  Skin: Skin is dry.  Psychiatric: She has a normal mood and affect. Her behavior is normal. Judgment and thought content normal.  Nursing note and vitals reviewed.      ED Treatments / Results  Labs (all labs ordered are listed, but only abnormal results are displayed) Labs Reviewed  COMPREHENSIVE METABOLIC PANEL - Abnormal; Notable for the following components:      Result Value   Potassium 3.3 (*)    Glucose, Bld 112 (*)    AST 14 (*)    All other components within normal limits  CBC WITH DIFFERENTIAL/PLATELET  URINALYSIS, ROUTINE W REFLEX MICROSCOPIC  I-STAT CG4 LACTIC ACID, ED  I-STAT CG4 LACTIC ACID, ED    EKG None  Radiology Dg Foot Complete Left  Result Date: 01/31/2018 CLINICAL  DATA:  Patient reports infection to the bone in the fourth toe of the left foot spreading to the fifth toe. Streaking from the wound. Nausea and vomiting for 2 days. EXAM: LEFT FOOT - COMPLETE 3+ VIEW COMPARISON:  01/23/2018 FINDINGS: Old arthrodesis with screw  fixation of the interphalangeal joint of the first toe. Hardware appears intact. No evidence of acute fracture or dislocation. Evaluation of the fourth and fifth toes is limited due to the positioning but no obvious bone sclerosis or cortical erosion is identified suggesting no evidence of significant osteomyelitis. Soft tissue swelling is seen over the forefoot region. Small calcaneal spurs. Mild degenerative changes in the intertarsal joints. IMPRESSION: Old postoperative changes in the first toe. Soft tissue swelling. No evidence of osteomyelitis. Electronically Signed   By: Lucienne Capers M.D.   On: 01/31/2018 05:57    Procedures Procedures (including critical care time)  Medications Ordered in ED Medications - No data to display   Initial Impression / Assessment and Plan / ED Course  I have reviewed the triage vital signs and the nursing notes.  Pertinent labs & imaging results that were available during my care of the patient were reviewed by me and considered in my medical decision making (see chart for details).     Patient with left toe infection.  She has been being treated by her podiatrist with clindamycin for her left fourth toe infection.  She reports that the infection has spread to her left fifth toe.  She also reports streaking up her foot.  She called her doctor, and was encouraged to come to the emergency department.  She is noted to be afebrile, does not have a significant leukocytosis or lactic acidosis.  Her vital signs are stable.  However, given that she has been on clindamycin for the past 7 days, and has had worsening redness and spreading of the infection, feel that inpatient treatment is warranted.  Left foot plain films show no osteomyelitis.  Patient discussed with Dr. Dina Rich, who agrees with plan for admission.  Appreciate Dr. Blaine Hamper for bringing patient into the hospital.  Final Clinical Impressions(s) / ED Diagnoses   Final diagnoses:  Cellulitis of left lower  extremity    ED Discharge Orders    None       Montine Circle, PA-C 01/31/18 [id number]    Merryl Hacker, MD 01/31/18 [phone]

## 2018-01-31 NOTE — Consult Note (Signed)
[facility] Nurse wound consult note Reason for Consult:Infectious wound to left fourth toe, ongoing.  Has not responded to conservative outpatient therapy.  Erythema and tenderness has extended to fifth toe.  Podiatry considering amputation.  Will defer to their service.  Will write topical orders until then.  On IV Vancomycin for infection.  Wound type:Infectious Pressure Injury POA: NA Measurement: 4th toe:  0.5 cm nonintact central lesion with surrounding edema and erythema, extending to 5th metatarsal.  Wound THY:HOOIL red Drainage (amount, consistency, odor) Purulence Periwound:Edema and erythema Dressing procedure/placement/frequency: Pea sized amount mupirocin ointment to 4th toe wound. Wrap toe with 2x2.  Cover remaining toes with kerlix and tape loosely. Change daily.  Will not follow at this time.  Please re-consult if needed.  Domenic Moras MSN, RN, FNP-BC [facility] Wound, Ostomy, Continence Nurse Pager [phone]

## 2018-01-31 NOTE — ED Triage Notes (Signed)
Pt reports she was told she has an "infection to bone." in the 4th toe to L foot spreading to pinky toe. Pt reports their is streaking from wound. Pt reports nausea and vomiting x 2 days. Pt was told by PCP to come in for tx.

## 2018-01-31 NOTE — H&P (Signed)
History and Physical    [name] [name] NOB:[id number] DOB: [birth year] DOA: 01/31/2018  PCP: [name], MD Consultants:  Earleen Newport - podiatry; Patrice Paradise - back surgeon Patient coming from:  Home - lives with mother, niece; NOK: Mother, [phone]  Chief Complaint: Left foot infection  HPI: [name] [name] is a 52 y.o. female with medical history significant of OSA; remote h/o seizures; pre-DM; HTN; and bipolar d/o presenting with left foot infection that has failed outpatient antibiotics.  About 1 week ago, she noticed her "toe was just ragingly infected."  She went to the doctor (podiatry) and was given Clindamycin.  Last night, she took her sock and shoe off and noticed it was spreading and going down into her foot.  Her doctor recommended that she come to the ER for IV antibiotics.  She has had foot problems for 5-6 years, but she has never had this problem before.  No fevers.   ED Course: [place], per Dr. Blaine Hamper:  52 year old lady with past medical history of hypertension, prediabetes, peripheral neuropathy, asthma, GERD, depression, anxiety, bipolar disorder, OSA, seizure, who presents with foot infection, in left fourth toe and fifth toe. Patient has been taking clindamycin for 7 days, but symptoms have worsened. WBC 7.2, no fever. Podiatrist, Dr. March Rummage recommended to start patient with antibiotics. Patient may need to have amputation, but not very sure yet. I will place patient on MedSurg bed for observation now. Vancomycin was started in ED.  Review of Systems: As per HPI; otherwise review of systems reviewed and negative.   Ambulatory Status:  Ambulates with a cane  Past Medical History:  Diagnosis Date  . Anxiety   . Asthma    mild intermittent  . Bipolar 1 disorder ([place])    ect treatments last treatment Sep 02 1011  . Depression   . GERD (gastroesophageal reflux disease)   . Hypertension   . Pre-diabetes   . Seizures ([place])    last seizure was so long ago she can't  remember.   . Sleep apnea    wears CPAP, uncertain of setting    Past Surgical History:  Procedure Laterality Date  . ABDOMINAL HYSTERECTOMY    . BACK SURGERY    . CARPAL TUNNEL RELEASE     x2  . Laproscopic knee surgery    . NECK SURGERY  03/15/2017  . PLANTAR FASCIA RELEASE     x2    Social History   Socioeconomic History  . Marital status: Legally Separated    Spouse name: Not on file  . Number of children: 3  . Years of education: 34  . Highest education level: Not on file  Occupational History  . Occupation: Disabled  Social Needs  . Financial resource strain: Not on file  . Food insecurity:    Worry: Not on file    Inability: Not on file  . Transportation needs:    Medical: Not on file    Non-medical: Not on file  Tobacco Use  . Smoking status: Never Smoker  . Smokeless tobacco: Never Used  Substance and Sexual Activity  . Alcohol use: No  . Drug use: No  . Sexual activity: Not on file  Lifestyle  . Physical activity:    Days per week: Not on file    Minutes per session: Not on file  . Stress: Not on file  Relationships  . Social connections:    Talks on phone: Not on file    Gets together: Not on file  Attends religious service: Not on file    Active member of club or organization: Not on file    Attends meetings of clubs or organizations: Not on file    Relationship status: Not on file  . Intimate partner violence:    Fear of current or ex partner: Not on file    Emotionally abused: Not on file    Physically abused: Not on file    Forced sexual activity: Not on file  Other Topics Concern  . Not on file  Social History Narrative   Patient drink 4 16oz caffeine drinks a day     Allergies  Allergen Reactions  . Penicillins Hives and Rash    Has patient had a PCN reaction causing immediate rash, facial/tongue/throat swelling, SOB or lightheadedness with hypotension: Yes Has patient had a PCN reaction causing severe rash involving mucus  membranes or skin necrosis: No Has patient had a PCN reaction that required hospitalization: No Has patient had a PCN reaction occurring within the last 10 years: No If all of the above answers are "NO", then may proceed with Cephalosporin use.  . Tetracyclines & Related Other (See Comments)    Syncope and put her "in a coma"  . Tramadol Other (See Comments)    Seizures  . Phenazopyridine Other (See Comments)    Unknown  . Tetracycline Other (See Comments)    Syncope and "Put me in a coma"  . Ciprofloxacin Rash and Itching  . Codeine Itching and Rash  . Estradiol Rash    Patches broke out the skin    Family History  Problem Relation Age of Onset  . Diabetes Mother   . COPD Mother   . Hypertension Mother   . Hyperlipidemia Mother   . Heart disease Father   . Hyperlipidemia Father   . Hypertension Father   . Cancer Father   . Heart disease Brother   . Heart disease Daughter   . Heart disease Maternal Grandmother   . Hypertension Maternal Grandmother   . Heart disease Maternal Grandfather   . Kidney cancer Paternal Grandmother   . Heart disease Paternal Grandfather     Prior to Admission medications   Medication Sig Start Date End Date Taking? Authorizing Provider  Artificial Tear Ointment (ULTRA FRESH PM) OINT Apply to eye.    [provider]  busPIRone (BUSPAR) 10 MG tablet Take 1 tablet by mouth three times a day For anxiety 04/26/17   [provider]  busPIRone (BUSPAR) 15 MG tablet TAKE ONE TABLET (15 MG) BY MOUTH THREE TIMES DAILY FOR ANXIETY 12/30/17   [provider]  Calcium Carb-Cholecalciferol 500-400 MG-UNIT CHEW Chew by mouth.    [provider]  Calcium Carbonate-Vit D-Min (CALCIUM 1200 PO) Take 1 tablet by mouth daily.    [provider]  citalopram (CELEXA) 40 MG tablet Take 40 mg by mouth at bedtime.  05/28/11   Darrol Jump, MD  clindamycin (CLEOCIN) 300 MG capsule Take 1 capsule (300 mg total) by mouth 3 (three)  times daily. 01/23/18   [name], DPM  clotrimazole (LOTRIMIN) 1 % cream Apply 1 application topically 2 (two) times daily. 04/21/17   Shelly Bombard, MD  cyclobenzaprine (FLEXERIL) 10 MG tablet Take 10 mg by mouth 2 (two) times daily as needed for muscle spasms.     [provider]  Dextran 70-Hypromellose, PF, (ARTIFICIAL TEARS PF) 0.1-0.3 % SOLN Place 1-2 drops into both eyes 3 (three) times daily as needed (  for dry eyes).    [provider]  EMBEDA 30-1.2 MG CPCR Take 1 capsule by mouth 2 (two) times daily.  10/19/16   [provider]  estradiol (ESTRACE) 1 MG tablet Take 1 tablet (1 mg total) by mouth daily. 10/06/17   [name], MD  fluticasone (FLONASE) 50 MCG/ACT nasal spray Place 2 sprays into both nostrils daily as needed for allergies. 09/29/17   [name], MD  gabapentin (NEURONTIN) 600 MG tablet Take 1 tablet (600 mg total) by mouth 3 (three) times daily. For anxiety and pain management. May have to stop to take ECT. Patient taking differently: Take 600 mg by mouth 4 (four) times daily. For anxiety and pain management 05/28/11   Darrol Jump, MD  haloperidol (HALDOL) 5 MG tablet Take 1 tablet (5 mg total) by mouth at bedtime. For psychosis 11/15/15   Barton Dubois, MD  hydrochlorothiazide (HYDRODIURIL) 25 MG tablet TAKE ONE TABLET BY MOUTH DAILY 01/11/17   [name], MD  levocetirizine (XYZAL) 5 MG tablet TAKE ONE TABLET BY MOUTH EVERY EVENING 11/04/17   [name], MD  lidocaine (XYLOCAINE) 5 % ointment Apply 1-2 grams (1 gram = 1 inch) to the affected area 2-3 times daily for 30 days. 12/28/17   [provider]  LINZESS 290 MCG CAPS capsule TAKE ONE CAPSULE BY MOUTH DAILY BEFORE BREAKFAST 12/26/17   [name], MD  meloxicam (MOBIC) 15 MG tablet Take 15 mg by mouth daily. 09/15/17   [provider]  metoprolol succinate (TOPROL XL) 25 MG 24 hr tablet Take 0.5 tablets (12.5 mg total) by mouth daily.  11/01/17   [name], MD  Multiple Vitamins-Minerals (MULTIVITAMIN WITH MINERALS) tablet Take 1 tablet by mouth daily.    [provider]  oxyCODONE-acetaminophen (PERCOCET) 10-325 MG tablet Take 1 tablet by mouth 3 (three) times daily. 12/14/16   [provider]  pantoprazole (PROTONIX) 40 MG tablet TAKE ONE TABLET BY MOUTH TWICE DAILY 08/08/17   [name], MD  potassium chloride SA (KLOR-CON M20) 20 MEQ tablet Take 2 tablets (40 mEq total) by mouth daily. Patient taking differently: Take 20 mEq by mouth 2 (two) times daily.  11/02/16   [name], MD  predniSONE (DELTASONE) 10 MG tablet Take 1 tablet (10 mg total) by mouth daily. 09/26/17   Donne Hazel, MD  PROAIR HFA 108 [phone] Base) MCG/ACT inhaler Inhale 2 puffs = 157mcg into the lungs every 6 (six) hours as needed for wheezing or shortness of breath. 01/10/18   [name], MD  promethazine (PHENERGAN) 25 MG tablet Take 1 tablet (25 mg total) by mouth every 8 (eight) hours as needed for nausea or vomiting. 01/24/18   [name], DPM  topiramate (TOPAMAX) 50 MG tablet TAKE ONE TABLET BY MOUTH TWICE DAILY 12/26/17   [name], MD  traZODone (DESYREL) 100 MG tablet Take 100 mg by mouth at bedtime. 07/29/14   [provider]    Physical Exam: Vitals:   01/31/18 0730 01/31/18 0815 01/31/18 0945 01/31/18 1122  BP: 104/69 101/79 111/67 105/63  Pulse: 73 80 72 78  Resp:    17  Temp:    98.2 F (36.8 C)  TempSrc:    Oral  SpO2: 95% 93% 95% 100%  Weight:      Height:         General:  Appears calm and comfortable and is NAD Eyes:  PERRL, EOMI, normal  lids, iris ENT:  grossly normal hearing, lips & tongue, mmm; poor dentition Neck:  no LAD, masses or thyromegaly; no carotid bruits Cardiovascular:  RRR, no m/r/g. No LE edema.  Respiratory:   CTA bilaterally with no wheezes/rales/rhonchi.  Normal respiratory effort. Abdomen:  soft, NT, ND, NABS Skin:  Marked erythema with  ulceration of the smallest toes on the left foot with streaking up the foot and ankle.  There is also a shallow ulceration on the right plantar foot adjacent to the site of the prior 2nd toe amputation.        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 Streaking from the wound. Nausea and vomiting for 2 days. EXAM: LEFT FOOT - COMPLETE 3+ VIEW COMPARISON:  01/23/2018 FINDINGS: Old arthrodesis with screw fixation of the interphalangeal joint of the first toe. Hardware appears intact. No evidence of acute fracture or dislocation. Evaluation of the fourth and fifth toes is limited due to the positioning but no obvious bone sclerosis or cortical erosion is identified suggesting no evidence of significant osteomyelitis. Soft tissue swelling is seen over the forefoot region. Small calcaneal spurs. Mild degenerative changes in the intertarsal joints. IMPRESSION: Old postoperative changes in the first toe. Soft tissue swelling. No evidence of osteomyelitis. Electronically Signed   By: Lucienne Capers M.D.   On: 01/31/2018 05:57    EKG: not done   Labs on Admission: I have personally reviewed the available labs and imaging studies at the time of the admission.  Pertinent labs:   Lactate 1.31, 1.63 Glucose 112 CMP otherwise unremarkable WBC WNL UA WNL  Assessment/Plan Principal Problem:   Cellulitis Active Problems:   Manic bipolar I disorder with rapid cycling ([name])   Chronic pain syndrome   Essential hypertension   Seizures ([name])   Sleep apnea   Foot ulcer ([name])   Hyperglycemia    Cellulitis -Patient with cellulitis and ulceration of left 4th and 5th toes, refractory to outpatient antibiotics -Given her prior h/o toe amputation, she apparently has PVD and so is at increased risk of poor wound healing and need for amputation -According to the patient, the plan is for amputation tomorrow -Will admit, Med Surg -Antibiotics with Rocephin, Flagyl, and Vancomycin per lower extremity ulcer order set -Lower extremity wound infection order set utilized, including orders for CM, SW, DM coordinator, wound care, and nutrition consult. -Goal would be for glucose <150 to facilitate wound healing. -Patient should be on bed rest, non-weight bearing. -Excellent BP control is needed  -Podiatry is following  R plantar foot ulcer -As noted above, will need consults as above -There is foul-smelling, purulent drainage from this foot, as well -Hopefully, antibiotic therapy will improve this -Podiatry is following  Hyperglycemia -h/o "pre-diabetes" -Her A1c is 5.3 -She does not appear to need medication for this issue at this time  Chronic pain syndrome -I have reviewed this patient in the [place] Controlled Substances Reporting System.  She is receiving medications from only one provider and appears to be taking them as prescribed. -She is averaging 105 morphine milligram equivalents/day -She is at particularly high risk of opioid misuse,  diversion, or overdose. -I have continued her home medications - Embeda and Percocet 10/325 -I have added prn IV morphine in small dose for breakthrough pain only while she is NPO starting at midnight.  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 -She is not currently taking  anti-seizure medications -She should probably continue to avoid medications (Wellbutrin, Tramadol, etc) that lower the seizure threshold -She is taking both Topamax and Neurontin at home  DVT prophylaxis: Heparin Code Status:  Full - confirmed with patient Family Communication: None present  Disposition Plan:  Home once clinically improved Consults called: Podiatry; CM, SW, DM coordinator, wound care, and nutrition   Admission status: Admit - It is my clinical opinion that admission to [facility] is reasonable and necessary because of the expectation that this patient will require hospital care that crosses at least 2 midnights to treat this condition based on the medical complexity of the problems presented.  Given the aforementioned information, the predictability of an adverse outcome is felt to be significant.    Karmen Bongo MD [facility]  If note is complete, please contact covering daytime or nighttime physician. [email or web address] Password [email or web address]  01/31/2018, 12:54 PM

## 2018-01-31 NOTE — Care Management Note (Signed)
Case Management Note  Patient Details  Name: [name] [name] MRN: [id number] Date of Birth: 21-Feb-1966  Subjective/Objective:                    Action/Plan:  Consulted for home health and DME needs. Will continue to follow for discharge needs and plan of care. Will need home health orders with wound care instructions. Expected Discharge Date:                  Expected Discharge Plan:     In-House Referral:     Discharge planning Services  CM Consult  Post Acute Care Choice:  Durable Medical Equipment, Home Health Choice offered to:     DME Arranged:    DME Agency:     HH Arranged:    HH Agency:     Status of Service:  In process, will continue to follow  If discussed at Long Length of Stay Meetings, dates discussed:    Additional Comments:  Marilu Favre, RN 01/31/2018, 11:11 AM

## 2018-01-31 NOTE — ED Notes (Signed)
Pt reports that she has an infection on her ring toe. Pt states that her PCP told her she has an infection in the bone and they will be amputating same off next Wednesday but she needs antibiotics now to prevent any spread of infection.

## 2018-01-31 NOTE — Care Management (Signed)
This is a no charge note  Pending admission per Dr. Marlon Pel  52 year old lady with past medical history of hypertension, prediabetes, peripheral neuropathy, asthma, GERD, depression, anxiety, bipolar disorder, OSA, seizure, who presents with foot infection, in left fourth toe and fifth toe.  Patient has been taking clindamycin for 7 days, but symptoms have worsened.  WBC 7.2, no fever.  Podiatrist, Dr. March Rummage recommended to start patient with antibiotics.  Patient may need to have amputation, but not very sure yet.  I will place patient on MedSurg bed for observation now.  Vancomycin was started in ED.   Ivor Costa, MD  [facility] Pager [phone]  If 7PM-7AM, please contact night-coverage [email or web address] Password [email or web address] 01/31/2018, 6:24 AM

## 2018-01-31 NOTE — Telephone Encounter (Signed)
I called Caren Griffins at [facility] and canceled the surgery for tomorrow per Dr. Jacqualyn Posey.  Ms. Ehrsam has been admitted into the hospital.

## 2018-01-31 NOTE — Telephone Encounter (Signed)
Pt's postop appointments and visit for today have been canceled due to pt being in the hospital.

## 2018-02-01 ENCOUNTER — Telehealth: Payer: Self-pay | Admitting: *Deleted

## 2018-02-01 DIAGNOSIS — M86672 Other chronic osteomyelitis, left ankle and foot: Secondary | ICD-10-CM

## 2018-02-01 LAB — SURGICAL PCR SCREEN
MRSA, PCR: NEGATIVE
Staphylococcus aureus: NEGATIVE

## 2018-02-01 LAB — CBC
HCT: 35.8 % — ABNORMAL LOW (ref 36.0–46.0)
Hemoglobin: 11.6 g/dL — ABNORMAL LOW (ref 12.0–15.0)
MCH: 31.9 pg (ref 26.0–34.0)
MCHC: 32.4 g/dL (ref 30.0–36.0)
MCV: 98.4 fL (ref 80.0–100.0)
Platelets: 289 10*3/uL (ref 150–400)
RBC: 3.64 MIL/uL — ABNORMAL LOW (ref 3.87–5.11)
RDW: 13.2 % (ref 11.5–15.5)
WBC: 6.4 10*3/uL (ref 4.0–10.5)
nRBC: 0 % (ref 0.0–0.2)

## 2018-02-01 LAB — GLUCOSE, CAPILLARY
Glucose-Capillary: 84 mg/dL (ref 70–99)
Glucose-Capillary: 106 mg/dL — ABNORMAL HIGH (ref 70–99)
Glucose-Capillary: 95 mg/dL (ref 70–99)
Glucose-Capillary: 89 mg/dL (ref 70–99)

## 2018-02-01 LAB — BASIC METABOLIC PANEL
Anion gap: 8 (ref 5–15)
BUN: 7 mg/dL (ref 6–20)
CO2: 24 mmol/L (ref 22–32)
Calcium: 8.8 mg/dL — ABNORMAL LOW (ref 8.9–10.3)
Chloride: 107 mmol/L (ref 98–111)
Creatinine, Ser: 0.62 mg/dL (ref 0.44–1.00)
GFR calc Af Amer: >60 mL/min (ref >60)
GFR calc non Af Amer: >60 mL/min (ref >60)
Glucose, Bld: 99 mg/dL (ref 70–99)
Potassium: 3.2 mmol/L — ABNORMAL LOW (ref 3.5–5.1)
Sodium: 139 mmol/L (ref 135–145)

## 2018-02-01 LAB — HIV ANTIBODY (ROUTINE TESTING W REFLEX): HIV Screen 4th Generation wRfx: NONREACTIVE

## 2018-02-01 MED ORDER — PROSIGHT PO TABS
1.0000 | ORAL_TABLET | Freq: Every day | ORAL | Status: DC
  Administered 2018-02-01 – 2018-02-03 (×3): 1 via ORAL
  Filled 2018-02-01 (×3): qty 1

## 2018-02-01 MED ORDER — ENOXAPARIN SODIUM 40 MG/0.4ML ~~LOC~~ SOLN
40.0000 mg | SUBCUTANEOUS | Status: DC
  Administered 2018-02-02: 40 mg via SUBCUTANEOUS
  Filled 2018-02-01: qty 0.4

## 2018-02-01 MED ORDER — OCUVITE-LUTEIN PO CAPS: 1.0000 | ORAL_CAPSULE | Freq: Every day | ORAL | Status: DC

## 2018-02-01 MED ORDER — CHLORHEXIDINE GLUCONATE CLOTH 2 % EX PADS
6.0000 | MEDICATED_PAD | Freq: Once | CUTANEOUS | Status: AC
  Administered 2018-02-01: 6 via TOPICAL

## 2018-02-01 MED ORDER — CHLORHEXIDINE GLUCONATE CLOTH 2 % EX PADS: 6.0000 | MEDICATED_PAD | Freq: Once | CUTANEOUS | Status: AC

## 2018-02-01 MED ORDER — GLUCERNA SHAKE PO LIQD
237.0000 mL | Freq: Three times a day (TID) | ORAL | Status: DC
  Administered 2018-02-01 – 2018-02-03 (×5): 237 mL via ORAL

## 2018-02-01 MED ORDER — PRO-STAT SUGAR FREE PO LIQD
30.0000 mL | Freq: Two times a day (BID) | ORAL | Status: DC
  Administered 2018-02-01 – 2018-02-03 (×5): 30 mL via ORAL
  Filled 2018-02-01 (×5): qty 30

## 2018-02-01 MED ORDER — SALINE SPRAY 0.65 % NA SOLN
1.0000 | NASAL | Status: DC | PRN
  Filled 2018-02-01: qty 44

## 2018-02-01 NOTE — Progress Notes (Signed)
Initial Nutrition Assessment  DOCUMENTATION CODES:   Not applicable  INTERVENTION:  Glucerna TID: to provide 220kcal, 10g protein per serving Pro-stat BID: to provide 100kcal, 15g protein per serving MVI   NUTRITION DIAGNOSIS:   Increased nutrient needs related to wound healing, acute illness(non-healing wound to left 4&5 toe; cellulitis) as evidenced by estimated needs, energy intake < 75% for > 7 days.   GOAL:   Patient will meet greater than or equal to 90% of their needs   MONITOR:   PO intake, Supplement acceptance  REASON FOR ASSESSMENT:   Consult Wound healing  ASSESSMENT:  52yo w/ worsening ulceration, cellulitis of left foot after oral antibiotics x 1week. Per MD note, possible amputation to 4th toe on 10/17.  Pt w/ previous amputation to rt toe w/out complication.  PMH: HTN, Bipolar1, GERD, Vascular disease, Multiple myeloma, sickle cell anemia, hysterectomy  Pt reports feeling extremely nauseous d/t antibiotics at time of visit. Pt stated she ate half of french toast served at breakfast and 0% of dinner. Pt is receiving Zofran for nausea and stated that phenergan is the only medication that works for her. Pt endorses poor PO intake lasting a few weeks d/t pain and denies any recent changes to wt.     Pt reports UBW of 230lbs and last BM occurring 2 days ago.   Medications: Colace, Gabapentin, novoLog 0-9 (w/meals), Linzess, Morphine, oxyCodone, Protonix, Flagyl, Vancomycin  Labs: K (3.2) replacing           Ca (8.8) L - w/out serum albumin for correction   NUTRITION - FOCUSED PHYSICAL EXAM:    Most Recent Value  Orbital Region  Mild depletion  Upper Arm Region  No depletion  Thoracic and Lumbar Region  No depletion  Buccal Region  No depletion  Temple Region  Mild depletion  Clavicle Bone Region  No depletion  Clavicle and Acromion Bone Region  No depletion  Scapular Bone Region  Unable to assess [pt unable to move d/t nausea]  Dorsal Hand  No  depletion  Patellar Region  Unable to assess  Anterior Thigh Region  Unable to assess  Posterior Calf Region  Unable to assess  Edema (RD Assessment)  Mild [BLE,  mild pitting]  Hair  Reviewed  Eyes  Reviewed  Mouth  Reviewed  Skin  Reviewed  Nails  Reviewed       Diet Order:   Diet Order            Diet Carb Modified Fluid consistency: Thin; Room service appropriate? Yes  Diet effective now              EDUCATION NEEDS:   No education needs have been identified at this time  Skin:  Skin Assessment: Reviewed RN Assessment(amputation, 2nd rt toe, ceullitis; 4th/5th left toes)  Last BM:  10/14(per pt report)  Height:   Ht Readings from Last 1 Encounters:  01/31/18 '5\' 4"'$  (1.626 m)    Weight: 229.5 lbs  Wt Readings from Last 1 Encounters:  01/31/18 104.3 kg    Ideal Body Weight:  54.5 kg  BMI:  Body mass index is 39.48 kg/m.  Estimated Nutritional Needs:   Kcal:  2086-2200  Protein:  115-136g  Fluid:  2.2L    Lajuan Lines, RD, LDN  After Hours/Weekend Pager: [phone]

## 2018-02-01 NOTE — Progress Notes (Signed)
PROGRESS NOTE  [name] [name] GPQ:[id number] DOB: 1965/11/21 DOA: 01/31/2018 PCP: [name], MD  HPI/Recap of past 24 hours: HPI done by Dr Lorin Mercy 52 year old lady with past medical history of hypertension, prediabetes, peripheral neuropathy, asthma, GERD, depression, anxiety, bipolar disorder, OSA, seizure, who presents with foot infection, in left fourth toe and fifth toe. Patient has been taking clindamycin for 7 days, but symptoms have worsened. WBC 7.2, no fever. Podiatrist, Dr. March Rummage recommended to start patient on IV antibiotics and may need amputation. Pt admitted for further management.   Today, pt reported some nausea, no vomiting, denies any chest pain, SOB, abdominal pain, fever/chills.  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 Soft tissue swelling. No evidence of osteomyelitis Podiatry on board, status post bedside debridement on 10/15, may consider amputation Continue IV vancomycin, IV ceftriaxone, IV Flagyl Monitor closely  Hypokalemia Replace PRN  Chronic R plantar foot ulcer Stable Local wound care Podiatry on board  Pre-diabetes Last A1c is 5.3 Refused moderate carb diet, SSI, Accu-Cheks Monitor closely  Chronic pain syndrome Continue Flexeril, morphine, Percocet  HTN Stable Continue hydrochlorothiazide, metoprolol  OSA Continue CPAP  Hx of seizures Remote Continue Topamax, Neurontin  Bipolar Continue Celexa, Haldol, Buspar    Code Status: Full  Family Communication: None  at bedside  Disposition Plan: Once work-up complete, and significant improvement   Consultants:  Podiatry  Procedures:  None  Antimicrobials:  Vancomycin  Ceftriaxone  Flagyl  DVT prophylaxis: Lovenox   Objective: Vitals:   01/31/18 2114 01/31/18 2322 02/01/18 0428 02/01/18 1432  BP: 109/70  (!) 95/56 102/66  Pulse: 69 75 67 76  Resp:  '18 16 17  '$ Temp: 98.2 F (36.8 C)  98.2 F (36.8 C) 98.1 F (36.7 C)  TempSrc: Oral  Oral Oral  SpO2: 97% 93% 94% 95%  Weight:      Height:        Intake/Output Summary (Last 24 hours) at 02/01/2018 1451 Last data filed at 02/01/2018 0527 Gross per 24 hour  Intake 895.19 ml  Output 2000 ml  Net -1104.81 ml   Filed Weights   01/31/18 0233  Weight: 104.3 kg    Exam:   General: NAD  Cardiovascular: S1, S2 present  Respiratory: CTA B  Abdomen: Soft, nontender, nondistended, bowel sounds present  Musculoskeletal: Left foot with Ace wrap C/D/I, status post right second toe amputation  Skin: Normal except for left foot which is currently wrapped  Psychiatry: Flat affect and mood   Data Reviewed: CBC: Recent Labs  Lab 01/31/18 0248 02/01/18 0210  WBC 7.2 6.4  NEUTROABS 3.7  --   HGB 12.6 11.6*  HCT 38.9 35.8*  MCV 97.5 98.4  PLT 324 094   Basic Metabolic Panel: Recent Labs  Lab 01/31/18 0248 02/01/18 0210  NA 138 139  K 3.3* 3.2*  CL 105 107  CO2 24 24  GLUCOSE 112* 99  BUN 10 7  CREATININE 0.69 0.62  CALCIUM 9.3 8.8*   GFR: Estimated Creatinine Clearance: 94.5 mL/min (  by C-G formula based on SCr of 0.62 mg/dL). Liver Function Tests: Recent Labs  Lab 01/31/18 0248  AST 14*  ALT 15  ALKPHOS 66  BILITOT 0.3  PROT 7.3  ALBUMIN 3.6   No results for input(s): LIPASE, AMYLASE in the last 168 hours. No results for input(s): AMMONIA in the last 168 hours. Coagulation Profile: No results for input(s): INR, PROTIME in the last 168 hours. Cardiac Enzymes: No results for input(s): CKTOTAL,  CKMB, CKMBINDEX, TROPONINI in the last 168 hours. BNP (last 3 results) No results for input(s): PROBNP in the last 8760 hours. HbA1C: Recent Labs    01/31/18 1115  HGBA1C 5.3   CBG: Recent Labs  Lab 01/31/18 1231 01/31/18 1718 01/31/18 2116 02/01/18 0819 02/01/18 1211  GLUCAP 93 89 110* 84 106*   Lipid Profile: No results for input(s): CHOL, HDL, LDLCALC, TRIG, CHOLHDL, LDLDIRECT in the last 72 hours. Thyroid Function Tests: No results for input(s): TSH, T4TOTAL, FREET4, T3FREE, THYROIDAB in the last 72 hours. Anemia Panel: No results for input(s): VITAMINB12, FOLATE, FERRITIN, TIBC, IRON, RETICCTPCT in the last 72 hours. Urine analysis:    Component Value Date/Time   COLORURINE YELLOW 01/31/2018 0248   APPEARANCEUR CLEAR 01/31/2018 0248   LABSPEC 1.015 01/31/2018 0248   PHURINE 5.0 01/31/2018 0248   GLUCOSEU NEGATIVE 01/31/2018 0248   HGBUR NEGATIVE 01/31/2018 0248   BILIRUBINUR NEGATIVE 01/31/2018 0248   KETONESUR NEGATIVE 01/31/2018 0248   PROTEINUR NEGATIVE 01/31/2018 0248   UROBILINOGEN 0.2 06/23/2017 1108   NITRITE NEGATIVE 01/31/2018 0248   LEUKOCYTESUR NEGATIVE 01/31/2018 0248   Sepsis Labs: '@LABRCNTIP'$ (procalcitonin:4,lacticidven:4)  ) Recent Results (from the past 240 hour(s))  WOUND CULTURE     Status: Abnormal   Collection Time: 01/23/18  3:51 PM  Result Value Ref Range Status   MICRO NUMBER: [id number]  Final   SPECIMEN QUALITY: ADEQUATE  Final   SOURCE: WOUND (SITE NOT SPECIFIED)  Final   STATUS: FINAL  Final   GRAM STAIN:   Final    No white blood cells seen No epithelial cells seen No organisms seen   ISOLATE 1: Staphylococcus aureus (A)  Final    Comment: Moderate growth of Staphylococcus aureus      Susceptibility   Staphylococcus aureus - AEROBIC CULT, GRAM STAIN POSITIVE 1    VANCOMYCIN <=0.5 Sensitive     CIPROFLOXACIN <=0.5 Sensitive     CLINDAMYCIN >=8 Resistant     LEVOFLOXACIN <=0.12 Sensitive     ERYTHROMYCIN >=8 Resistant      GENTAMICIN <=0.5 Sensitive     OXACILLIN* <=0.25 Sensitive      * Oxacillin-susceptible staphylococci aresusceptible to other penicillinase-stablepenicillins (e.g. Methicillin, Nafcillin), beta-lactam/beta-lactamase inhibitor combinations, andcephems with staphylococcal indications, includingCefazolin.    TETRACYCLINE <=1 Sensitive     TRIMETH/SULFA* <=10 Sensitive      * Oxacillin-susceptible staphylococci aresusceptible to other penicillinase-stablepenicillins (e.g. Methicillin, Nafcillin), beta-lactam/beta-lactamase inhibitor combinations, andcephems with staphylococcal indications, includingCefazolin.Legend:S = Susceptible  I = IntermediateR = Resistant  NS = Not susceptible* = Not tested  NR = Not reported**NN = See antimicrobic comments  Culture, blood (Routine X 2) w Reflex to ID Panel     Status: None (Preliminary result)   Collection Time: 01/31/18  7:02 AM  Result Value Ref Range Status   Specimen Description BLOOD RIGHT ANTECUBITAL  Final   Special Requests   Final    BOTTLES DRAWN AEROBIC AND ANAEROBIC Blood Culture adequate volume   Culture   Final    NO GROWTH 1 DAY  Performed at [facility], [place] [address], [place],  [address]    Report Status PENDING  Incomplete  Culture, blood (Routine X 2) w Reflex to ID Panel     Status: None (Preliminary result)   Collection Time: 01/31/18  7:31 AM  Result Value Ref Range Status   Specimen Description BLOOD RIGHT ANTECUBITAL  Final   Special Requests   Final    BOTTLES DRAWN AEROBIC AND ANAEROBIC Blood Culture adequate volume   Culture   Final    NO GROWTH 1 DAY Performed at [facility], [place] [address], [place],  [address]    Report Status PENDING  Incomplete      Studies: No results found.  Scheduled Meds: . busPIRone  15 mg Oral TID  . citalopram  40 mg Oral QHS  . docusate sodium  100 mg Oral BID  . estradiol  1 mg Oral Daily  . feeding supplement (GLUCERNA SHAKE)  237 mL Oral TID BM  . feeding  supplement (PRO-STAT SUGAR FREE 64)  30 mL Oral BID  . gabapentin  600 mg Oral QID  . haloperidol  5 mg Oral QHS  . heparin  5,000 Units Subcutaneous Q8H  . hydrochlorothiazide  25 mg Oral Daily  . insulin aspart  0-9 Units Subcutaneous TID WC  . linaclotide  290 mcg Oral QAC breakfast  . loratadine  10 mg Oral QPM  . metoprolol succinate  12.5 mg Oral Daily  . morphine  30 mg Oral Q12H  . multivitamin  1 tablet Oral Daily  . mupirocin ointment   Topical Daily  . oxyCODONE-acetaminophen  1 tablet Oral TID   And  . oxyCODONE  5 mg Oral TID  . pantoprazole  40 mg Oral BID  . potassium chloride SA  20 mEq Oral BID  . topiramate  50 mg Oral BID  . traZODone  100 mg Oral QHS    Continuous Infusions: . cefTRIAXone (ROCEPHIN)  IV 2 g (02/01/18 0959)  . lactated ringers 75 mL/hr at 02/01/18 0527  . metronidazole 500 mg (02/01/18 0840)  . vancomycin 1,250 mg (02/01/18 0536)     LOS: 1 day     Alma Friendly, MD [facility]   If 7PM-7AM, please contact night-coverage [email or web address] 02/01/2018, 2:51 PM

## 2018-02-01 NOTE — Telephone Encounter (Signed)
I called and spoke to Dillwyn at [facility].  I scheduled [name] [name] for inpatient surgery, toe amputation 4th digit left, for tomorrow at 12 N.

## 2018-02-02 ENCOUNTER — Encounter (HOSPITAL_COMMUNITY)
Admission: EM | Disposition: A | Discharge status: Home / Self Care | Payer: Self-pay | Source: Home / Self Care | Attending: Internal Medicine

## 2018-02-02 ENCOUNTER — Inpatient Hospital Stay (HOSPITAL_COMMUNITY): Payer: 59

## 2018-02-02 ENCOUNTER — Encounter (HOSPITAL_COMMUNITY): Payer: Self-pay | Admitting: Orthopedic Surgery

## 2018-02-02 ENCOUNTER — Inpatient Hospital Stay (HOSPITAL_COMMUNITY): Payer: 59 | Admitting: Anesthesiology

## 2018-02-02 DIAGNOSIS — M86672 Other chronic osteomyelitis, left ankle and foot: Secondary | ICD-10-CM

## 2018-02-02 HISTORY — PX: AMPUTATION TOE: SHX6595

## 2018-02-02 LAB — CBC WITH DIFFERENTIAL/PLATELET
Abs Immature Granulocytes: 0.01 10*3/uL (ref 0.00–0.07)
Basophils Absolute: 0 10*3/uL (ref 0.0–0.1)
Basophils Relative: 1 %
Eosinophils Absolute: 0.2 10*3/uL (ref 0.0–0.5)
Eosinophils Relative: 3 %
HCT: 34.8 % — ABNORMAL LOW (ref 36.0–46.0)
Hemoglobin: 11 g/dL — ABNORMAL LOW (ref 12.0–15.0)
Immature Granulocytes: 0 %
Lymphocytes Relative: 40 %
Lymphs Abs: 2.3 10*3/uL (ref 0.7–4.0)
MCH: 31.1 pg (ref 26.0–34.0)
MCHC: 31.6 g/dL (ref 30.0–36.0)
MCV: 98.3 fL (ref 80.0–100.0)
Monocytes Absolute: 0.4 10*3/uL (ref 0.1–1.0)
Monocytes Relative: 7 %
Neutro Abs: 2.9 10*3/uL (ref 1.7–7.7)
Neutrophils Relative %: 49 %
Platelets: 258 10*3/uL (ref 150–400)
RBC: 3.54 MIL/uL — ABNORMAL LOW (ref 3.87–5.11)
RDW: 13.1 % (ref 11.5–15.5)
WBC: 5.7 10*3/uL (ref 4.0–10.5)
nRBC: 0 % (ref 0.0–0.2)

## 2018-02-02 LAB — BASIC METABOLIC PANEL
Anion gap: 7 (ref 5–15)
BUN: 7 mg/dL (ref 6–20)
CO2: 24 mmol/L (ref 22–32)
Calcium: 8.5 mg/dL — ABNORMAL LOW (ref 8.9–10.3)
Chloride: 105 mmol/L (ref 98–111)
Creatinine, Ser: 0.58 mg/dL (ref 0.44–1.00)
GFR calc Af Amer: >60 mL/min (ref >60)
GFR calc non Af Amer: >60 mL/min (ref >60)
Glucose, Bld: 103 mg/dL — ABNORMAL HIGH (ref 70–99)
Potassium: 3.2 mmol/L — ABNORMAL LOW (ref 3.5–5.1)
Sodium: 136 mmol/L (ref 135–145)

## 2018-02-02 LAB — GLUCOSE, CAPILLARY
Glucose-Capillary: 88 mg/dL (ref 70–99)
Glucose-Capillary: 98 mg/dL (ref 70–99)
Glucose-Capillary: 98 mg/dL (ref 70–99)
Glucose-Capillary: 125 mg/dL — ABNORMAL HIGH (ref 70–99)
Glucose-Capillary: 103 mg/dL — ABNORMAL HIGH (ref 70–99)
Glucose-Capillary: 122 mg/dL — ABNORMAL HIGH (ref 70–99)

## 2018-02-02 SURGERY — AMPUTATION, TOE
Anesthesia: Choice | Laterality: Left

## 2018-02-02 SURGERY — AMPUTATION, TOE
Anesthesia: Monitor Anesthesia Care | Site: Foot | Laterality: Left

## 2018-02-02 MED ORDER — MIDAZOLAM HCL 2 MG/2ML IJ SOLN
INTRAMUSCULAR | Status: AC
  Filled 2018-02-02: qty 2

## 2018-02-02 MED ORDER — POTASSIUM CHLORIDE CRYS ER 20 MEQ PO TBCR
40.0000 meq | EXTENDED_RELEASE_TABLET | Freq: Two times a day (BID) | ORAL | Status: AC
  Administered 2018-02-02: 40 meq via ORAL
  Filled 2018-02-02: qty 2

## 2018-02-02 MED ORDER — FENTANYL CITRATE (PF) 100 MCG/2ML IJ SOLN
INTRAMUSCULAR | Status: AC
  Filled 2018-02-02: qty 2

## 2018-02-02 MED ORDER — LACTATED RINGERS IV SOLN
INTRAVENOUS | Status: DC
  Administered 2018-02-02: 11:00:00 via INTRAVENOUS

## 2018-02-02 MED ORDER — FENTANYL CITRATE (PF) 100 MCG/2ML IJ SOLN
25.0000 ug | INTRAMUSCULAR | Status: DC | PRN
  Administered 2018-02-02: 50 ug via INTRAVENOUS

## 2018-02-02 MED ORDER — FENTANYL CITRATE (PF) 250 MCG/5ML IJ SOLN
INTRAMUSCULAR | Status: DC | PRN
  Administered 2018-02-02: 50 ug via INTRAVENOUS

## 2018-02-02 MED ORDER — PROPOFOL 500 MG/50ML IV EMUL
INTRAVENOUS | Status: DC | PRN
  Administered 2018-02-02: 75 ug/kg/min via INTRAVENOUS

## 2018-02-02 MED ORDER — BUPIVACAINE-EPINEPHRINE 0.5% -1:200000 IJ SOLN
INTRAMUSCULAR | Status: AC
  Filled 2018-02-02: qty 1

## 2018-02-02 MED ORDER — ONDANSETRON HCL 4 MG/2ML IJ SOLN
INTRAMUSCULAR | Status: DC | PRN
  Administered 2018-02-02: 4 mg via INTRAVENOUS

## 2018-02-02 MED ORDER — 0.9 % SODIUM CHLORIDE (POUR BTL) OPTIME
TOPICAL | Status: DC | PRN
  Administered 2018-02-02: 1000 mL

## 2018-02-02 MED ORDER — MIDAZOLAM HCL 5 MG/5ML IJ SOLN
INTRAMUSCULAR | Status: DC | PRN
  Administered 2018-02-02: 2 mg via INTRAVENOUS

## 2018-02-02 MED ORDER — LIDOCAINE 2% (20 MG/ML) 5 ML SYRINGE
INTRAMUSCULAR | Status: DC | PRN
  Administered 2018-02-02: 60 mg via INTRAVENOUS

## 2018-02-02 MED ORDER — ONDANSETRON HCL 4 MG/2ML IJ SOLN: 4.0000 mg | Freq: Once | INTRAMUSCULAR | Status: DC | PRN

## 2018-02-02 MED ORDER — LIDOCAINE HCL 2 % IJ SOLN
INTRAMUSCULAR | Status: AC
  Filled 2018-02-02: qty 20

## 2018-02-02 MED ORDER — LIDOCAINE HCL 2 % IJ SOLN
INTRAMUSCULAR | Status: DC | PRN
  Administered 2018-02-02: 5 mL

## 2018-02-02 MED ORDER — LIDOCAINE-EPINEPHRINE 2 %-1:100000 IJ SOLN
INTRAMUSCULAR | Status: AC
  Filled 2018-02-02: qty 1

## 2018-02-02 MED ORDER — FENTANYL CITRATE (PF) 250 MCG/5ML IJ SOLN
INTRAMUSCULAR | Status: AC
  Filled 2018-02-02: qty 5

## 2018-02-02 MED ORDER — BUPIVACAINE HCL 0.5 % IJ SOLN
INTRAMUSCULAR | Status: AC
  Filled 2018-02-02: qty 1

## 2018-02-02 MED ORDER — BUPIVACAINE HCL (PF) 0.5 % IJ SOLN
INTRAMUSCULAR | Status: DC | PRN
  Administered 2018-02-02: 5 mL

## 2018-02-02 SURGICAL SUPPLY — 39 items
BANDAGE ACE 3X5.8 VEL STRL LF (GAUZE/BANDAGES/DRESSINGS) ×1 IMPLANT
BANDAGE ACE 4X5 VEL STRL LF (GAUZE/BANDAGES/DRESSINGS) ×1 IMPLANT
BLADE LONG MED 31X9 (MISCELLANEOUS) IMPLANT
BNDG CMPR 9X4 STRL LF SNTH (GAUZE/BANDAGES/DRESSINGS) ×1
BNDG CONFORM 2 STRL LF (GAUZE/BANDAGES/DRESSINGS) ×1 IMPLANT
BNDG ESMARK 4X9 LF (GAUZE/BANDAGES/DRESSINGS) ×2 IMPLANT
BNDG GAUZE ELAST 4 BULKY (GAUZE/BANDAGES/DRESSINGS) ×2 IMPLANT
CONT SPEC 4OZ CLIKSEAL STRL BL (MISCELLANEOUS) ×2 IMPLANT
COVER WAND RF STERILE (DRAPES) ×1 IMPLANT
CUFF TOURNIQUET SINGLE 18IN (TOURNIQUET CUFF) IMPLANT
DRSG EMULSION OIL 3X3 NADH (GAUZE/BANDAGES/DRESSINGS) ×2 IMPLANT
DURAPREP 26ML APPLICATOR (WOUND CARE) ×2 IMPLANT
ELECT REM PT RETURN 9FT ADLT (ELECTROSURGICAL) ×2
ELECTRODE REM PT RTRN 9FT ADLT (ELECTROSURGICAL) ×1 IMPLANT
GAUZE SPONGE 4X4 12PLY STRL (GAUZE/BANDAGES/DRESSINGS) ×1 IMPLANT
GAUZE SPONGE 4X4 12PLY STRL LF (GAUZE/BANDAGES/DRESSINGS) ×1 IMPLANT
GLOVE BIO SURGEON STRL SZ7.5 (GLOVE) ×5 IMPLANT
GLOVE BIOGEL PI IND STRL 7.5 (GLOVE) ×1 IMPLANT
GLOVE BIOGEL PI INDICATOR 7.5 (GLOVE) ×1
GOWN STRL REUS W/ TWL LRG LVL3 (GOWN DISPOSABLE) ×1 IMPLANT
GOWN STRL REUS W/ TWL XL LVL3 (GOWN DISPOSABLE) ×1 IMPLANT
GOWN STRL REUS W/TWL LRG LVL3 (GOWN DISPOSABLE) ×2
GOWN STRL REUS W/TWL XL LVL3 (GOWN DISPOSABLE) ×2
KIT BASIN OR (CUSTOM PROCEDURE TRAY) ×2 IMPLANT
NDL HYPO 25X1 1.5 SAFETY (NEEDLE) ×1 IMPLANT
NDL SAFETY ECLIPSE 18X1.5 (NEEDLE) IMPLANT
NEEDLE HYPO 18GX1.5 SHARP (NEEDLE) ×2
NEEDLE HYPO 25X1 1.5 SAFETY (NEEDLE) ×2 IMPLANT
NS IRRIG 1000ML POUR BTL (IV SOLUTION) IMPLANT
PACK ORTHO EXTREMITY (CUSTOM PROCEDURE TRAY) IMPLANT
PADDING CAST ABS 4INX4YD NS (CAST SUPPLIES) ×1
PADDING CAST ABS COTTON 4X4 ST (CAST SUPPLIES) ×1 IMPLANT
SUT MNCRL AB 3-0 PS2 18 (SUTURE) ×2 IMPLANT
SUT MNCRL AB 4-0 PS2 18 (SUTURE) IMPLANT
SUT MON AB 5-0 PS2 18 (SUTURE) ×1 IMPLANT
SUT PROLENE 3 0 PS 2 (SUTURE) ×1 IMPLANT
SUT PROLENE 4 0 PS 2 18 (SUTURE) ×1 IMPLANT
SYR 10ML LL (SYRINGE) ×1 IMPLANT
UNDERPAD 30X30 (UNDERPADS AND DIAPERS) ×2 IMPLANT

## 2018-02-02 NOTE — Progress Notes (Signed)
PROGRESS NOTE  [name] [name] YQM:[id number] DOB: 08-25-1965 DOA: 01/31/2018 PCP: [name], MD  HPI/Recap of past 24 hours: HPI done by Dr Lorin Mercy 52 year old lady with past medical history of hypertension, prediabetes, peripheral neuropathy, asthma, GERD, depression, anxiety, bipolar disorder, OSA, seizure, who presents with foot infection, in left fourth toe and fifth toe. Patient has been taking clindamycin for 7 days, but symptoms have worsened. WBC 7.2, no fever. Podiatrist, Dr. March Rummage recommended to start patient on IV antibiotics and may need amputation. Pt admitted for further management.   Today, pt still reported some nausea, no vomiting, likely due to antibiotics. Pt denies any abdominal pain, chest pain, SOB, fever/chills.  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 Soft tissue swelling. No evidence of osteomyelitis Podiatry on board Continue IV vancomycin, IV ceftriaxone, IV Flagyl Monitor closely  Hypokalemia Replace PRN  Chronic R plantar foot ulcer Stable Local wound care Podiatry on board  Pre-diabetes Last A1c is 5.3 Refused moderate carb diet, SSI, Accu-Cheks Monitor closely  Chronic pain syndrome Continue Flexeril, morphine, Percocet  HTN BP soft Continue metoprolol, held HCTZ  OSA Continue CPAP  Hx of seizures Remote Continue Topamax, Neurontin  Bipolar Continue Celexa, Haldol, Buspar    Code Status: Full  Family Communication: None at  bedside  Disposition Plan: Likely on 02/03/18   Consultants:  Podiatry  Procedures:  Left 4th toe amputation on 02/02/18  Antimicrobials:  Vancomycin  Ceftriaxone  Flagyl  DVT prophylaxis: Lovenox   Objective: Vitals:   02/02/18 1410 02/02/18 1425 02/02/18 1440 02/02/18 1501  BP: 127/77 124/71 130/75 119/69  Pulse: 72 71 69 69  Resp: _0 Temp:   97.7 F (36.5 C) 98.2 F (36.8 C)  TempSrc:    Oral  SpO2: 97% 98% 98% 97%  Weight:      Height:        Intake/Output Summary (Last 24 hours) at 02/02/2018 1752 Last data filed at 02/02/2018 1700 Gross per 24 hour  Intake 1795.58 ml  Output 3800 ml  Net -2004.42 ml   Filed Weights   01/31/18 0233 02/02/18 1123  Weight: 104.3 kg 104.3 kg    Exam:   General: NAD  Cardiovascular: S1, S2 present  Respiratory: CTA B  Abdomen: Soft, nontender, nondistended, bowel sounds present  Musculoskeletal: Left foot with Ace wrap C/D/I, no pedal edema noted  Skin: Normal except for left foot which is currently wrapped  Psychiatry: Normal mood   Data Reviewed: CBC: Recent Labs  Lab 01/31/18 0248 02/01/18 0210 02/02/18 0244  WBC 7.2 6.4 5.7  NEUTROABS 3.7  --  2.9  HGB 12.6 11.6* 11.0*  HCT 38.9 35.8* 34.8*  MCV 97.5 98.4 98.3  PLT 324 289 889   Basic Metabolic Panel: Recent Labs  Lab 01/31/18 0248 02/01/18 0210 02/02/18 0244  NA 138 139 136  K 3.3* 3.2* 3.2*  CL 105 107 105  CO2 _1 GLUCOSE 112* 99 103*  BUN 10 7  7  CREATININE 0.69 0.62 0.58  CALCIUM 9.3 8.8* 8.5*   GFR: Estimated Creatinine Clearance: 94.5 mL/min (by C-G formula based on SCr of 0.58 mg/dL). Liver Function Tests: Recent Labs  Lab 01/31/18 0248  AST 14*  ALT 15  ALKPHOS 66  BILITOT 0.3  PROT 7.3  ALBUMIN 3.6   No results for input(s): LIPASE, AMYLASE in the last 168 hours. No results for input(s): AMMONIA in the last 168 hours. Coagulation Profile: No results for input(s): INR, PROTIME in the last  168 hours. Cardiac Enzymes: No results for input(s): CKTOTAL, CKMB, CKMBINDEX, TROPONINI in the last 168 hours. BNP (last 3 results) No results for input(s): PROBNP in the last 8760 hours. HbA1C: Recent Labs    01/31/18 1115  HGBA1C 5.3   CBG: Recent Labs  Lab 02/02/18 0830 02/02/18 1058 02/02/18 1316 02/02/18 1456 02/02/18 1721  GLUCAP 88 98 98 125* 103*   Lipid Profile: No results for input(s): CHOL, HDL, LDLCALC, TRIG, CHOLHDL, LDLDIRECT in the last 72 hours. Thyroid Function Tests: No results for input(s): TSH, T4TOTAL, FREET4, T3FREE, THYROIDAB in the last 72 hours. Anemia Panel: No results for input(s): VITAMINB12, FOLATE, FERRITIN, TIBC, IRON, RETICCTPCT in the last 72 hours. Urine analysis:    Component Value Date/Time   COLORURINE YELLOW 01/31/2018 0248   APPEARANCEUR CLEAR 01/31/2018 0248   LABSPEC 1.015 01/31/2018 0248   PHURINE 5.0 01/31/2018 0248   GLUCOSEU NEGATIVE 01/31/2018 0248   HGBUR NEGATIVE 01/31/2018 0248   BILIRUBINUR NEGATIVE 01/31/2018 0248   KETONESUR NEGATIVE 01/31/2018 0248   PROTEINUR NEGATIVE 01/31/2018 0248   UROBILINOGEN 0.2 06/23/2017 1108   NITRITE NEGATIVE 01/31/2018 0248   LEUKOCYTESUR NEGATIVE 01/31/2018 0248   Sepsis Labs: _0 (procalcitonin:4,lacticidven:4)  ) Recent Results (from the past 240 hour(s))  Culture, blood (Routine X 2) w Reflex to ID Panel     Status: None (Preliminary result)   Collection Time: 01/31/18  7:02 AM  Result Value Ref Range Status   Specimen Description BLOOD RIGHT ANTECUBITAL  Final   Special Requests   Final    BOTTLES DRAWN AEROBIC AND ANAEROBIC Blood Culture adequate volume   Culture   Final    NO GROWTH 2 DAYS Performed at [facility], [place] [address], [place], [place] [address]    Report Status PENDING  Incomplete  Culture, blood (Routine X 2) w Reflex to ID Panel     Status: None (Preliminary result)   Collection Time: 01/31/18  7:31 AM  Result Value Ref Range Status    Specimen Description BLOOD RIGHT ANTECUBITAL  Final   Special Requests   Final    BOTTLES DRAWN AEROBIC AND ANAEROBIC Blood Culture adequate volume   Culture   Final    NO GROWTH 2 DAYS Performed at [facility], [place] [address], [place], [place] [address]    Report Status PENDING  Incomplete  Surgical pcr screen     Status: None   Collection Time: 02/01/18 10:24 PM  Result Value Ref Range Status   MRSA, PCR NEGATIVE NEGATIVE Final   Staphylococcus aureus NEGATIVE NEGATIVE Final    Comment: (NOTE) The Xpert SA Assay (FDA approved for NASAL specimens in patients 83 years of age and older), is one component of a comprehensive surveillance program. It is not intended to diagnose infection nor to guide or monitor treatment. Performed at [facility], [place] [address], [place], [place] [address]       Studies: Dg Foot 2 Views Left  Result Date: 02/02/2018 CLINICAL DATA:  Post op check EXAM: LEFT FOOT - 2 VIEW COMPARISON:  01/31/2018 FINDINGS: Single cannulated screw at LEFT great toe a across IP joint fusion of the proximal distal phalanges. Bones demineralized. Interval amputation of the fourth toe with superimposed dressing artifacts. No acute fracture, dislocation, or bone destruction. 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 Prior LEFT great toe IP joint fusion. Electronically Signed   By: Lavonia Dana M.D.   On: 02/02/2018 14:31    Scheduled Meds: . busPIRone  15 mg Oral TID  . citalopram  40 mg Oral QHS  . docusate sodium  100 mg Oral BID  . enoxaparin (LOVENOX) injection  40 mg Subcutaneous Q24H  . estradiol  1 mg Oral Daily  . feeding supplement (GLUCERNA SHAKE)  237 mL Oral TID BM  . feeding supplement (PRO-STAT SUGAR FREE 64)  30 mL Oral BID  . gabapentin  600 mg Oral QID  . haloperidol  5 mg Oral QHS  . hydrochlorothiazide  25 mg Oral Daily  . insulin aspart  0-9 Units Subcutaneous TID WC  . linaclotide  290 mcg Oral QAC breakfast  . loratadine   10 mg Oral QPM  . metoprolol succinate  12.5 mg Oral Daily  . morphine  30 mg Oral Q12H  . multivitamin  1 tablet Oral Daily  . mupirocin ointment   Topical Daily  . oxyCODONE-acetaminophen  1 tablet Oral TID   And  . oxyCODONE  5 mg Oral TID  . pantoprazole  40 mg Oral BID  . potassium chloride SA  40 mEq Oral BID WC  . topiramate  50 mg Oral BID  . traZODone  100 mg Oral QHS    Continuous Infusions: . cefTRIAXone (ROCEPHIN)  IV 2 g (02/02/18 1529)  . lactated ringers 75 mL/hr at 02/01/18 2026  . lactated ringers 10 mL/hr at 02/02/18 1128  . metronidazole 500 mg (02/02/18 0259)  . vancomycin 1,250 mg (02/02/18 1740)     LOS: 2 days     Alma Friendly, MD [facility]   If 7PM-7AM, please contact night-coverage [email or web address] 02/02/2018, 5:52 PM

## 2018-02-02 NOTE — Progress Notes (Signed)
RT placed patient on CPAP HS auto 53max and 62min. No O2 bleed in needed. Patient tolerating well at this time.

## 2018-02-02 NOTE — Progress Notes (Signed)
Report given to [employer] rn as caregiver

## 2018-02-02 NOTE — Anesthesia Preprocedure Evaluation (Addendum)
Anesthesia Evaluation    Reviewed: Allergy & Precautions, Patient's Chart, lab work & pertinent test results  Airway Mallampati: II  TM Distance: >3 FB Neck ROM: Full    Dental no notable dental hx.    Pulmonary asthma , sleep apnea and Continuous Positive Airway Pressure Ventilation ,    Pulmonary exam normal breath sounds clear to auscultation       Cardiovascular hypertension, Pt. on medications and Pt. on home beta blockers Normal cardiovascular exam Rhythm:Regular Rate:Normal  ECG: NSR, rate 71  Myocardial perfusion Nuclear stress EF: 67%. The left ventricular ejection fraction is hyperdynamic (>65%). There was no ST segment deviation noted during stress. The study is normal. 1. EF 67%, normal wall motion.  2. Fixed small, mild mid anterior perfusion defect.  Given normal wall motion, suspect soft tissue attenuation.   Low risk study.    Neuro/Psych  Headaches, Seizures -, Well Controlled,  PSYCHIATRIC DISORDERS Anxiety Depression Bipolar Disorder    GI/Hepatic Neg liver ROS, GERD  Medicated and Controlled,  Endo/Other  Morbid obesity  Renal/GU negative Renal ROS     Musculoskeletal negative musculoskeletal ROS (+)   Abdominal (+) + obese,   Peds  Hematology  (+) anemia ,   Anesthesia Other Findings Left 4th and 5th toe cellulitis  Reproductive/Obstetrics                            Anesthesia Physical Anesthesia Plan  ASA: III  Anesthesia Plan: MAC   Post-op Pain Management:    Induction: Intravenous  PONV Risk Score and Plan: 2 and Propofol infusion, Ondansetron and Treatment may vary due to age or medical condition  Airway Management Planned: Natural Airway  Additional Equipment:   Intra-op Plan:   Post-operative Plan:   Informed Consent: I have reviewed the patients History and Physical, chart, labs and discussed the procedure including the risks, benefits and  alternatives for the proposed anesthesia with the patient or authorized representative who has indicated his/her understanding and acceptance.   Dental advisory given  Plan Discussed with: CRNA  Anesthesia Plan Comments:         Anesthesia Quick Evaluation

## 2018-02-02 NOTE — Transfer of Care (Signed)
Immediate Anesthesia Transfer of Care Note  Patient: TIASHA HELVIE  Procedure(s) Performed: AMPUTATION TOE Left 4th toe (Left )  Patient Location: PACU  Anesthesia Type:MAC  Level of Consciousness: awake, alert  and patient cooperative  Airway & Oxygen Therapy: Patient Spontanous Breathing and Patient connected to face mask oxygen  Post-op Assessment: Report given to RN and Post -op Vital signs reviewed and stable  Post vital signs: Reviewed and stable  Last Vitals:  Vitals Value Taken Time  BP    Temp    Pulse 71 02/02/2018  1:11 PM  Resp 8 02/02/2018  1:11 PM  SpO2 100 % 02/02/2018  1:11 PM  Vitals shown include unvalidated device data.  Last Pain:  Vitals:   02/02/18 1056  TempSrc: Oral  PainSc:          Complications: No apparent anesthesia complications

## 2018-02-02 NOTE — Progress Notes (Signed)
Subjective: [name] [name] was admitted for worsening cellulitis to her left foot to her 4th and 5th toes. I am seeing her in the pre-op holding area as she is scheduled for left 4th toe amputation given continued erythema to the toe and infection to the wound. She wants to proceed with the surgery today for a left 4th toe amputation and has no new concerns. No fevers, chills, chest pain, shortness of breath, leg pain.  Objective: AAO x3, NAD DP/PT pulses palpable bilaterally, CRT less than 3 seconds Ulceration on the dorsal aspect of the left 4th toe with associated cellulitis.  Bandage intact. Upon removal ulceration continues to the left 4th toe with small amount of pus with continued erythema to the toe without improvement despite antibiotics. No fluctuance or crepitance.  Superficial area of skin breakdown to the left 5th toe with surrounding erythema however this has improved. There is no warmth to the toe and no ascending cellulitis.  There is no edema or erythema to the dorsal foot and there is no warmth to this area.  No pain with calf compression, swelling, warmth, erythema  Assessment: 52 year old female with left 4th and 5th toe cellulitis  Plan: -All treatment options discussed with the patient including all alternatives, risks, complications.  -Surgical consent was reviewed and signed. Will plan for surgery as planned, left 4th toe amputation. She understands the risks, alternatives, complications of surgery. Left 5th toe has improved, will continue to monitor.  -NPO -She has no further questions and no family is present.  -Patient encouraged to call the office with any questions, concerns, change in symptoms.   Celesta Gentile, DPM [facility] O: [phone] C: [phone]

## 2018-02-02 NOTE — Progress Notes (Signed)
Subjective: [name] [name] was admitted for worsening cellulitis to her left foot to her 4th and 5th toes.  She been on oral antibiotics (clindamycin) and despite this she had progressive worsening of her infection and she was sent to the emergency room when she was admitted.  She said that she started noticing redness to her left fifth toe Monday night which prompted her to go the emergency room. She is a history of a second toe amputation right foot. Denies any systemic complaints such as fevers, chills, nausea, vomiting. No acute changes since last appointment, and no other complaints at this time.   Objective: AAO x3, NAD DP/PT pulses palpable bilaterally, CRT less than 3 seconds Ulceration on the dorsal aspect of the left 4th toe with associated cellulitis. There is a some purulence coming from the ulceration on the left 4th toe ulcer today which is new. The erythema is on the dorsal aspect of the toe.  Superficial wound on the left 5th toe with associated cellulitis, which is improving. There is no fluctuance or crepitance.  Wound on the plantar right foot sub metatarsal which is more superficial and starting to scab over. There is no surrounding erythema, ascending cellulitis, fluctuance or crepitance.   No open lesions or pre-ulcerative lesions.  No pain with calf compression, swelling, warmth, erythema  Assessment: 52 year old female with left 4th and 5th toe cellulitis  Plan: -All treatment options discussed with the patient including all alternatives, risks, complications.  -Reviewed the blood work and x-ray findings with her today again. She states that she wants to go ahead and proceed with the 4th toe amputation. Today there is a small amount of purulence and overall the cellulitis to this toe is not greatly improved. Discussed trying to save the toe but she wants to go ahead and proceed with amputation. The cellulitis is improving on 5th toe and without x-ray findings I do not recommend  amputation of the 5th toe but stated that if it needed to be amputated go ahead. Discussed if the wound worsens on the 5th toe then proceed with amputation of that toe at a later date. Discussed alternatives, risks, complications of the amputation including spread of infection, further amputation, need for further surgery, stroke, heart attack, death.  -Will plan on left 4th toe amputation tomorrow, 02/02/2018 at noon.  -NPO after midnight -Patient encouraged to call the office with any questions, concerns, change in symptoms.   Celesta Gentile, DPM [facility] O: [phone] C: [phone]

## 2018-02-02 NOTE — Anesthesia Postprocedure Evaluation (Signed)
Anesthesia Post Note  Patient: [name] [name]  Procedure(s) Performed: AMPUTATION TOE Left 4th toe (Left Foot)     Patient location during evaluation: PACU Anesthesia Type: MAC Level of consciousness: awake Pain management: pain level controlled Vital Signs Assessment: post-procedure vital signs reviewed and stable Respiratory status: spontaneous breathing Cardiovascular status: stable Postop Assessment: no apparent nausea or vomiting Anesthetic complications: no    Last Vitals:  Vitals:   02/02/18 1310 02/02/18 1325  BP: 122/70 114/70  Pulse: 71 70  Resp: 12 13  Temp: 36.5 C   SpO2: 100% 99%    Last Pain:  Vitals:   02/02/18 1310  TempSrc:   PainSc: 0-No pain                 Dejaun Vidrio

## 2018-02-02 NOTE — Anesthesia Procedure Notes (Signed)
Procedure Name: MAC Date/Time: 02/02/2018 12:30 PM Performed by: Myna Bright, CRNA Pre-anesthesia Checklist: Patient identified, Emergency Drugs available, Suction available and Patient being monitored Patient Re-evaluated:Patient Re-evaluated prior to induction Oxygen Delivery Method: Simple face mask Placement Confirmation: positive ETCO2 and breath sounds checked- equal and bilateral

## 2018-02-02 NOTE — Brief Op Note (Signed)
02/02/2018  1:00 PM  PATIENT:  [name] [name]  52 y.o. female  PRE-OPERATIVE DIAGNOSIS:  Infection  POST-OPERATIVE DIAGNOSIS:  * No post-op diagnosis entered *  PROCEDURE:  Procedure(s): AMPUTATION TOE Left 4th toe (Left)  SURGEON:  Surgeon(s) and Role:    * [name], DPM - Primary  PHYSICIAN ASSISTANT:   ASSISTANTS: none   ANESTHESIA:   MAC  EBL:  Minimal    BLOOD ADMINISTERED:none  DRAINS: none   LOCAL MEDICATIONS USED:  OTHER 10 cc marcaine and lidocaine plain  SPECIMEN:  Source of Specimen:  toe  DISPOSITION OF SPECIMEN:  PATHOLOGY  COUNTS:  YES  TOURNIQUET:  * Missing tourniquet times found for documented tourniquets in log: [id number] *  DICTATION: .Dragon Dictation  PLAN OF CARE: inpatient  PATIENT DISPOSITION:  PACU - hemodynamically stable.   Delay start of Pharmacological VTE agent (>24hrs) due to surgical blood loss or risk of bleeding: no  Interoperative findings: After the toe was removed debrided nonviable tissue. Remaining tissue was healthy and underlying metatarsal head was hard and white and did not appear to have any infection. No abscess found.  I evaluated the 5th toe. The wound does not probe to bone. No pus identified. Will continue to monitor. Hopefully the 5th toe continues to improve but she is aware she may need an amputation of that toe as well but will watch closely.   Celesta Gentile, DPM

## 2018-02-03 ENCOUNTER — Encounter (HOSPITAL_COMMUNITY): Payer: Self-pay | Admitting: Podiatry

## 2018-02-03 DIAGNOSIS — M869 Osteomyelitis, unspecified: Secondary | ICD-10-CM

## 2018-02-03 LAB — CBC WITH DIFFERENTIAL/PLATELET
Abs Immature Granulocytes: 0.02 10*3/uL (ref 0.00–0.07)
Basophils Absolute: 0 10*3/uL (ref 0.0–0.1)
Basophils Relative: 0 %
Eosinophils Absolute: 0.2 10*3/uL (ref 0.0–0.5)
Eosinophils Relative: 3 %
HCT: 33.8 % — ABNORMAL LOW (ref 36.0–46.0)
Hemoglobin: 10.8 g/dL — ABNORMAL LOW (ref 12.0–15.0)
Immature Granulocytes: 0 %
Lymphocytes Relative: 26 %
Lymphs Abs: 1.9 10*3/uL (ref 0.7–4.0)
MCH: 31.4 pg (ref 26.0–34.0)
MCHC: 32 g/dL (ref 30.0–36.0)
MCV: 98.3 fL (ref 80.0–100.0)
Monocytes Absolute: 0.6 10*3/uL (ref 0.1–1.0)
Monocytes Relative: 8 %
Neutro Abs: 4.6 10*3/uL (ref 1.7–7.7)
Neutrophils Relative %: 63 %
Platelets: 251 10*3/uL (ref 150–400)
RBC: 3.44 MIL/uL — ABNORMAL LOW (ref 3.87–5.11)
RDW: 13.1 % (ref 11.5–15.5)
WBC: 7.2 10*3/uL (ref 4.0–10.5)
nRBC: 0 % (ref 0.0–0.2)

## 2018-02-03 LAB — BASIC METABOLIC PANEL
Anion gap: 7 (ref 5–15)
BUN: 5 mg/dL — ABNORMAL LOW (ref 6–20)
CO2: 24 mmol/L (ref 22–32)
Calcium: 8.9 mg/dL (ref 8.9–10.3)
Chloride: 109 mmol/L (ref 98–111)
Creatinine, Ser: 0.61 mg/dL (ref 0.44–1.00)
GFR calc Af Amer: >60 mL/min (ref >60)
GFR calc non Af Amer: >60 mL/min (ref >60)
Glucose, Bld: 105 mg/dL — ABNORMAL HIGH (ref 70–99)
Potassium: 3.4 mmol/L — ABNORMAL LOW (ref 3.5–5.1)
Sodium: 140 mmol/L (ref 135–145)

## 2018-02-03 LAB — GLUCOSE, CAPILLARY
Glucose-Capillary: 105 mg/dL — ABNORMAL HIGH (ref 70–99)
Glucose-Capillary: 109 mg/dL — ABNORMAL HIGH (ref 70–99)
Glucose-Capillary: 98 mg/dL (ref 70–99)

## 2018-02-03 MED ORDER — SULFAMETHOXAZOLE-TRIMETHOPRIM 800-160 MG PO TABS: 1.0000 | ORAL_TABLET | Freq: Two times a day (BID) | ORAL | 0 refills | Status: AC

## 2018-02-03 MED ORDER — POTASSIUM CHLORIDE CRYS ER 20 MEQ PO TBCR
20.0000 meq | EXTENDED_RELEASE_TABLET | Freq: Two times a day (BID) | ORAL | Status: DC
  Administered 2018-02-03: 20 meq via ORAL
  Filled 2018-02-03: qty 1

## 2018-02-03 NOTE — Discharge Summary (Signed)
Discharge Summary  [name] [name]:[id number] DOB: 1965-09-11  PCP: [name] Frizzle, MD  Admit date: 01/31/2018 Discharge date: 02/03/2018  Time spent: 35 mins  Recommendations for Outpatient Follow-up:  1. Podiatry 2. PCP   Discharge Diagnoses:  Active Hospital Problems   Diagnosis Date Noted  . Cellulitis 09/24/2017  . Foot ulcer ([name] [name]) 01/31/2018  . Hyperglycemia 01/31/2018  . Seizures ([name]) 09/23/2016  . Sleep apnea 09/23/2016  . Chronic pain syndrome   . 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 Essential hypertension 02/24/2011    Resolved Hospital Problems  No resolved problems to display.    Discharge Condition: Stable  Diet recommendation: Heart healthy/mod carb  Vitals:   02/03/18 1425 02/03/18 1738  BP: (!) 91/59 129/78  Pulse: 64 68  Resp: 17   Temp: 98.3 F (36.8 C)   SpO2: 98%     History of present illness:  52 year old lady with past medical history of hypertension, prediabetes, peripheral neuropathy, asthma, GERD, depression, anxiety, bipolar disorder, OSA, seizure, who presents with foot infection, in left fourth toe and fifth toe. Patient has been taking clindamycin for 7 days, but symptoms have worsened. WBC 7.2, no fever. Podiatrist, Dr. March [name] recommended to start patient on IV antibiotics and may need amputation. Pt admitted for further management.   Today, pt reported feeling better, denies any new complaints. 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 Soft tissue swelling. No evidence of osteomyelitis Podiatry on board, ok to d/c home today with 10 days of PO bactrim, will follow up with patient S/P IV vancomycin, IV ceftriaxone, IV Flagyl Follow up with Podiatry  Hypokalemia Continue potassium supplement  Chronic R plantar foot ulcer Stable Local wound care Podiatry will follow up  Pre-diabetes Last A1c is 5.3 Diet controlled  Chronic pain syndrome Continue Flexeril, morphine, Percocet  HTN BP soft Continue metoprolol, held HCTZ  OSA Continue CPAP  Hx of seizures Remote Continue Topamax, Neurontin  Bipolar Continue Celexa, Haldol, Buspar    Procedures: 4th toe amputation on 02/02/18  Consultations:  Podiatry  Discharge Exam: BP 129/78 (BP Location: Right Arm)   Pulse 68   Temp 98.3 F (36.8 C) (Oral)   Resp 17   Ht 5' 4.02" (1.626 m)   Wt 104.3 kg   SpO2 98%   BMI 39.46 kg/m   General: NAD Cardiovascular: S1, S2 present Respiratory: CTAB  Discharge Instructions You were cared for by a hospitalist during your hospital stay. If you have any questions about your discharge medications or the care you received while you were in the hospital after you are discharged, you can call the unit and asked to speak with the hospitalist on call if the hospitalist that took care of you is not available. Once you are discharged, your primary care physician will handle any further medical issues. Please note that NO REFILLS  for any discharge medications will be authorized once you are discharged, as it is imperative that you return to your primary care physician (or establish a relationship with a primary care physician if you do not have one) for your aftercare needs so that they can reassess your need for medications and monitor your lab values.   Allergies as of 02/03/2018      Reactions     Penicillins Hives, Rash   Has patient had a PCN reaction causing immediate rash, facial/tongue/throat swelling, SOB or lightheadedness with hypotension: Yes Has patient had a PCN reaction causing severe rash involving mucus membranes or skin necrosis: No Has patient had a PCN reaction that required hospitalization: No Has patient had a PCN reaction occurring within the last 10 years: No If all of the above answers are "NO", then may proceed with Cephalosporin use.   Tetracyclines & Related Other (See Comments)   Syncope and put her "in a coma"   Tramadol Other (See Comments)   Seizures   Phenazopyridine Other (See Comments)   Unknown   Tetracycline Other (See Comments)   Syncope and "Put me in a coma"   Ciprofloxacin Rash, Itching   Codeine Itching, Rash   Estradiol Rash   Patches broke out the skin      Medication List    STOP taking these medications   hydrochlorothiazide 25 MG tablet Commonly known as:  HYDRODIURIL     TAKE these medications   ARTIFICIAL TEARS PF 0.1-0.3 % Soln Generic drug:  Dextran 70-Hypromellose (PF) Place 1-2 drops into both eyes 3 (three) times daily as needed (for dry eyes).   busPIRone 15 MG tablet Commonly known as:  BUSPAR Take 15 mg by mouth 3 (three) times daily.   CALCIUM 1200 PO Take 1 tablet by mouth daily.   citalopram 40 MG tablet Commonly known as:  CELEXA Take 40 mg by mouth at bedtime.   clotrimazole 1 % cream Commonly known as:  LOTRIMIN Apply 1 application topically 2 (two) times daily.   cyclobenzaprine 10 MG tablet Commonly known as:  FLEXERIL Take 10 mg by mouth 2 (two) times daily as needed for muscle spasms.   EMBEDA 30-1.2 MG Cpcr Generic drug:  Morphine-Naltrexone Take 1 capsule by mouth 2 (two) times daily.   estradiol 1 MG tablet Commonly known as:  ESTRACE Take 1 tablet (1 mg total) by mouth daily.   fluticasone 50 MCG/ACT nasal spray Commonly known as:  FLONASE Place 2 sprays into both nostrils daily  as needed for allergies.   gabapentin 600 MG tablet Commonly known as:  NEURONTIN Take 1 tablet (600 mg total) by mouth 3 (three) times daily. For anxiety and pain management. May have to stop to take ECT. What changed:    when to take this  additional instructions   haloperidol 5 MG tablet Commonly known as:  HALDOL Take 1 tablet (5 mg total) by mouth at bedtime. For psychosis   levocetirizine 5 MG tablet Commonly known as:  XYZAL TAKE ONE TABLET BY MOUTH EVERY EVENING   lidocaine 5 % ointment Commonly known as:  XYLOCAINE Apply 1-2 grams (1 gram = 1 inch) to the affected area 2-3 times daily for 30 days.   LINZESS 290 MCG Caps capsule Generic drug:  linaclotide TAKE ONE CAPSULE BY MOUTH DAILY BEFORE BREAKFAST   meloxicam 15 MG tablet Commonly known as:  MOBIC Take 15 mg by mouth daily.   metoprolol succinate 25 MG 24 hr tablet Commonly known as:  TOPROL-XL Take 0.5 tablets (12.5 mg total) by mouth daily.   multivitamin with minerals tablet Take 1 tablet by mouth daily.   oxyCODONE-acetaminophen 10-325 MG tablet Commonly known as:  PERCOCET Take 1 tablet by mouth 3 (three) times daily.   pantoprazole 40 MG tablet Commonly known as:  PROTONIX TAKE ONE TABLET BY MOUTH TWICE DAILY   potassium chloride SA 20 MEQ tablet Commonly known as:  K-DUR,KLOR-CON Take 2 tablets (40 mEq total) by mouth daily. What changed:    how much to take  when to take this   PROAIR HFA 108 (90 Base) MCG/ACT inhaler Generic drug:  albuterol Inhale 2 puffs = 160mg into the lungs every 6 (six) hours as needed for wheezing or shortness of breath.   promethazine 25 MG tablet Commonly known as:  PHENERGAN Take 1 tablet (25 mg total) by mouth every 8 (eight) hours as needed for nausea or vomiting.   sulfamethoxazole-trimethoprim 800-160 MG tablet Commonly known as:  BACTRIM DS,SEPTRA DS Take 1 tablet by mouth 2 (two) times daily for 10 days. Start taking on:  02/04/2018     topiramate 50 MG tablet Commonly known as:  TOPAMAX TAKE ONE TABLET BY MOUTH TWICE DAILY   traZODone 100 MG tablet Commonly known as:  DESYREL Take 100 mg by mouth at bedtime.      Allergies  Allergen Reactions  . Penicillins Hives and Rash    Has patient had a PCN reaction causing immediate rash, facial/tongue/throat swelling, SOB or lightheadedness with hypotension: Yes Has patient had a PCN reaction causing severe rash involving mucus membranes or skin necrosis: No Has patient had a PCN reaction that required hospitalization: No Has patient had a PCN reaction occurring within the last 10 years: No If all of the above answers are "NO", then may proceed with Cephalosporin use.  . Tetracyclines & Related Other (See Comments)    Syncope and put her "in a coma"  . Tramadol Other (See Comments)    Seizures  . Phenazopyridine Other (See Comments)    Unknown  . Tetracycline Other (See Comments)    Syncope and "Put me in a coma"  . Ciprofloxacin Rash and Itching  . Codeine Itching and Rash  . Estradiol Rash    Patches broke out the skin   Follow-up Information    WTrula Slade DPM. Go on 02/13/2018.   Specialty:  Podiatry Contact information: [phone]        PSusy Frizzle MD. Schedule an appointment as soon as possible for a visit in 1 week(s).   Specialty:  Family Medicine Contact information: [phone]        BLorretta Harp MD .   Specialties:  Cardiology, Radiology Contact information: [address] [phone]           The results of significant diagnostics from this hospitalization (including imaging, microbiology, ancillary and laboratory) are listed below for reference.    Significant Diagnostic Studies: Dg Foot 2 Views Left  Result Date: 02/02/2018 CLINICAL DATA:  Post op check EXAM: LEFT  FOOT - 2 VIEW COMPARISON:  01/31/2018 FINDINGS: Single cannulated screw at LEFT great toe a across IP joint fusion of the proximal distal phalanges. Bones demineralized. Interval amputation of the fourth toe with superimposed dressing artifacts. No acute fracture, dislocation, or bone destruction. 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  Prior LEFT great toe IP joint fusion. Electronically Signed   By: Lavonia Dana M.D.   On: 02/02/2018 14:31   Dg Foot Complete Left  Result Date: 01/31/2018 CLINICAL DATA:  Patient reports infection to the bone in the fourth toe of the left foot spreading to the fifth toe. Streaking from the wound. Nausea and vomiting for 2 days. EXAM: LEFT FOOT - COMPLETE 3+ VIEW COMPARISON:  01/23/2018 FINDINGS: Old arthrodesis with screw fixation of the interphalangeal joint of the first toe. Hardware appears intact. No evidence of acute fracture or dislocation. Evaluation of the fourth and fifth toes is limited due to the positioning but no obvious bone sclerosis or cortical erosion is identified suggesting no evidence of significant osteomyelitis. Soft tissue swelling is seen over the forefoot region. Small calcaneal spurs. Mild degenerative changes in the intertarsal joints. IMPRESSION: Old postoperative changes in the first toe. Soft tissue swelling. No evidence of osteomyelitis. Electronically Signed   By: Lucienne Capers M.D.   On: 01/31/2018 05:57   Dg Foot Complete Left  Result Date: 01/24/2018 Please see detailed radiograph report in office note.   Microbiology: Recent Results (from the past 240 hour(s))  Culture, blood (Routine X 2) w Reflex to ID Panel     Status: None (Preliminary result)   Collection Time: 01/31/18  7:02 AM  Result Value Ref Range Status   Specimen Description BLOOD RIGHT ANTECUBITAL  Final   Special Requests   Final    BOTTLES DRAWN AEROBIC AND ANAEROBIC Blood Culture adequate volume   Culture   Final    NO GROWTH 3 DAYS Performed  at [facility], [address] [place], [place] [address]    Report Status PENDING  Incomplete  Culture, blood (Routine X 2) w Reflex to ID Panel     Status: None (Preliminary result)   Collection Time: 01/31/18  7:31 AM  Result Value Ref Range Status   Specimen Description BLOOD RIGHT ANTECUBITAL  Final   Special Requests   Final    BOTTLES DRAWN AEROBIC AND ANAEROBIC Blood Culture adequate volume   Culture   Final    NO GROWTH 3 DAYS Performed at [facility], [place] [address], [place], [place] [address]    Report Status PENDING  Incomplete  Surgical pcr screen     Status: None   Collection Time: 02/01/18 10:24 PM  Result Value Ref Range Status   MRSA, PCR NEGATIVE NEGATIVE Final   Staphylococcus aureus NEGATIVE NEGATIVE Final    Comment: (NOTE) The Xpert SA Assay (FDA approved for NASAL specimens in patients 27 years of age and older), is one component of a comprehensive surveillance program. It is not intended to diagnose infection nor to guide or monitor treatment. Performed at [facility], [place] [address], [place], [place] [address]      Labs: Basic Metabolic Panel: Recent Labs  Lab 01/31/18 0248 02/01/18 0210 02/02/18 0244 02/03/18 0154  NA 138 139 136 140  K 3.3* 3.2* 3.2* 3.4*  CL 105 107 105 109  CO2 '24 24 24 24  '$ GLUCOSE 112* 99 103* 105*  BUN '10 7 7 '$ 5*  CREATININE 0.69 0.62 0.58 0.61  CALCIUM 9.3 8.8* 8.5* 8.9   Liver Function Tests: Recent Labs  Lab 01/31/18 0248  AST 14*  ALT 15  ALKPHOS 66  BILITOT 0.3  PROT 7.3  ALBUMIN 3.6   No results for input(s): LIPASE, AMYLASE in the last 168 hours. No results for  input(s): AMMONIA in the last 168 hours. CBC: Recent Labs  Lab 01/31/18 0248 02/01/18 0210 02/02/18 0244 02/03/18 0154  WBC 7.2 6.4 5.7 7.2  NEUTROABS 3.7  --  2.9 4.6  HGB 12.6 11.6* 11.0* 10.8*  HCT 38.9 35.8* 34.8* 33.8*  MCV 97.5 98.4 98.3 98.3  PLT 324 289 258 251   Cardiac Enzymes: No results for input(s):  CKTOTAL, CKMB, CKMBINDEX, TROPONINI in the last 168 hours. BNP: BNP (last 3 results) No results for input(s): BNP in the last 8760 hours.  ProBNP (last 3 results) No results for input(s): PROBNP in the last 8760 hours.  CBG: Recent Labs  Lab 02/02/18 1721 02/02/18 2105 02/03/18 0823 02/03/18 1234 02/03/18 1714  GLUCAP 103* 122* 105* 109* 98       Signed:  Alma Friendly, MD [facility] 02/03/2018, 5:41 PM

## 2018-02-03 NOTE — Evaluation (Signed)
Physical Therapy Evaluation Patient Details Name: [name] [name] MRN: [id number] DOB: 1965-07-28 Today's Date: 02/03/2018   History of Present Illness   [name] [name] is a 52 y.o. female with medical history significant of OSA; remote h/o seizures; pre-DM; HTN; and bipolar d/o presenting with left foot infection that has failed outpatient antibiotics.  Now s/p 4th toe amputation 02/02/18.  Clinical Impression  Patient presents with mobility at S to mod I level despite new weight restrictions (heel weight bearing) and cam boot.  Feel she will progress at home without formal PT as protecting foot and keeping it elevated important.  Also education on using walker to assist with weight protection.  No further skilled PT needs at this time.     Follow Up Recommendations No PT follow up;Supervision for mobility/OOB    Equipment Recommendations  None recommended by PT    Recommendations for Other Services       Precautions / Restrictions Precautions Precautions: Fall Required Braces or Orthoses: Other Brace/Splint Other Brace/Splint: camboot Restrictions Weight Bearing Restrictions: Yes RLE Weight Bearing: Weight bearing as tolerated(on heel as much as possible )      Mobility  Bed Mobility               General bed mobility comments: up in bathroom upon my entry  Transfers Overall transfer level: Needs assistance Equipment used: Rolling walker (2 wheeled) Transfers: Sit to/from Stand Sit to Stand: Modified independent (Device/Increase time)         General transfer comment: in bathroom and stood on her own  Ambulation/Gait Ambulation/Gait assistance: Supervision Gait Distance (Feet): 80 Feet Assistive device: Rolling walker (2 wheeled) Gait Pattern/deviations: Step-to pattern;Decreased stride length     General Gait Details: cues for heel weight bearing in camboot and to use walker till full weight allowed  Stairs            Wheelchair Mobility     Modified Rankin (Stroke Patients Only)       Balance Overall balance assessment: Needs assistance   Sitting balance-Leahy Scale: Good     Standing balance support: During functional activity Standing balance-Leahy Scale: Fair Standing balance comment: washing hands at sink no LOB                             Pertinent Vitals/Pain Pain Assessment: Faces Faces Pain Scale: Hurts even more Pain Location: R foot with ambulation Pain Descriptors / Indicators: Grimacing;Tender Pain Intervention(s): Monitored during session;Repositioned    Home Living Family/patient expects to be discharged to:: Private residence Living Arrangements: Parent Available Help at Discharge: Family;Available 24 hours/day Type of Home: House Home Access: Ramped entrance     Home Layout: One level Home Equipment: Walker - 2 wheels;Tub bench      Prior Function Level of Independence: Independent         Comments: takes care of 61 month old     Hand Dominance        Extremity/Trunk Assessment        Lower Extremity Assessment Lower Extremity Assessment: Overall WFL for tasks assessed       Communication   Communication: No difficulties  Cognition Arousal/Alertness: Awake/alert Behavior During Therapy: Flat affect Overall Cognitive Status: Within Functional Limits for tasks assessed  General Comments General comments (skin integrity, edema, etc.): NT in room and had donned camboot for pt and she walked to bathroom with IV pole; education about weight protection and use of walker; also eduated on elevation and activity restriction while foot healing.     Exercises     Assessment/Plan    PT Assessment Patent does not need any further PT services  PT Problem List         PT Treatment Interventions      PT Goals (Current goals can be found in the Care Plan section)  Acute Rehab PT Goals PT Goal Formulation:  All assessment and education complete, DC therapy    Frequency     Barriers to discharge        Co-evaluation               AM-PAC PT "6 Clicks" Daily Activity  Outcome Measure Difficulty turning over in bed (including adjusting bedclothes, sheets and blankets)?: A Little Difficulty moving from lying on back to sitting on the side of the bed? : A Little Difficulty sitting down on and standing up from a chair with arms (e.g., wheelchair, bedside commode, etc,.)?: None Help needed moving to and from a bed to chair (including a wheelchair)?: A Little Help needed walking in hospital room?: A Little Help needed climbing 3-5 steps with a railing? : A Little 6 Click Score: 19    End of Session Equipment Utilized During Treatment: Gait belt Activity Tolerance: Patient tolerated treatment well Patient left: in chair;with call bell/phone within reach   PT Visit Diagnosis: Difficulty in walking, not elsewhere classified (R26.2)    Time: 4259-5638 PT Time Calculation (min) (ACUTE ONLY): 19 min   Charges:   PT Evaluation $PT Eval Low Complexity: [place], PT Acute Rehabilitation Services [phone] 02/03/2018   Reginia Naas 02/03/2018, 3:12 PM

## 2018-02-03 NOTE — Op Note (Signed)
PATIENT:  [name] [name]  52 y.o. female  PRE-OPERATIVE DIAGNOSIS:  Infection  POST-OPERATIVE DIAGNOSIS:  Same  PROCEDURE:  Procedure(s): AMPUTATION TOE Left 4th toe (Left)  SURGEON:  Surgeon(s) and Role:    * [name], DPM - Primary  PHYSICIAN ASSISTANT:   ASSISTANTS: none   ANESTHESIA:   MAC  EBL:  Minimal    BLOOD ADMINISTERED:none  DRAINS: none   LOCAL MEDICATIONS USED:  OTHER 10 cc marcaine and lidocaine plain  SPECIMEN:  Source of Specimen:  toe  DISPOSITION OF SPECIMEN:  PATHOLOGY  COUNTS:  YES  TOURNIQUET: none  DICTATION: .Dragon Dictation  PLAN OF CARE: inpatient  PATIENT DISPOSITION:  PACU - hemodynamically stable.   Delay start of Pharmacological VTE agent (>24hrs) due to surgical blood loss or risk of bleeding: no  Indications for surgery: 52 year old female presented to the hospital for cellulitis to her left fourth and fifth toes.  I had seen her as an outpatient she was scheduled for left fourth toe amputation however due to worsening of the ulcer and worsening cellulitis she was admitted to the hospital.  Despite being on IV antibiotics the left fourth toe had purulence coming from the wound as well as continued erythema but there is no ascending cellulitis.  The wound of the fifth toe appears to be improving as well as the infection, cellulitis.  Due to this we discussed left fourth toe amputation.  Discussed possibly the future need to update left fifth toe at this point were going to observe it and hopefully he can see that toe.  We discussed alternatives, risks, complications.  No promises or guarantees were given with the operative procedure and all questions were answered the best my ability.  Procedure in detail: The patient was both verbally and visually identified by myself, nursing staff, the anesthesia staff in the preoperative holding area.  She was then transferred to the operative room via stretcher and placed  on the operating table in supine position.  After adequate plane of anesthesia was obtained a mixture of 10 cc of lidocaine and Marcaine plain was infiltrated in a regional block fashion at the surgical site after timeout was performed.  The left lower extremity was then scrubbed, prepped, draped in normal sterile fashion.  Appropriate timeout was performed again.  A modified racquet shaped incision was planned along the fourth MPJ just proximal to the area of the cellulitis.  The incision was made with a #15 blade scalpel from skin to bone.  At this time the fourth toe was then disarticulated at the level of the PIPJ and passed off the table and sent this to pathology.  At this time I evaluated the area and there was no signs of an abscess of the underlying metatarsal head appeared to be viable.  Was hard in nature and white in color.  Did not appear to have any underlying infection present with the toe was removed.  The toe itself did have some inflamed tissue but I was able to debride all nonviable tissue to the wound bed utilizing a scalpel as well as a rongeur.  Hemostasis was achieved.  Incision was closely irrigated with saline.  I further inspected the wound there is no signs of an abscess or any underlying infection the tissue appeared to be healthy.  At this time we will proceed with wound closure.  The wound was lightly closed with 3-0 Prolene.   I also evaluated the fifth toe during surgery.  There is  a superficial wound to the dorsal PIPJ but it does not probe to bone.  There is no purulence identified to this area.  Betadine soaked Adaptic was applied to both the fourth toe amputation site as well as the fifth toe ulceration.  Dry sterile dressing was then applied.  She is welcome anesthesia and found to have tolerated procedure well any complications.  She was transferred to PACU vital signs stable and vascular status intact.  Celesta Gentile, DPM

## 2018-02-03 NOTE — Care Management Important Message (Signed)
Important Message  Patient Details  Name: [name] [name] MRN: [id number] Date of Birth: 12-16-1965   Medicare Important Message Given:  Yes    Orbie Pyo 02/03/2018, 2:06 PM

## 2018-02-03 NOTE — Progress Notes (Signed)
Orthopedic Tech Progress Note Patient Details:  [name] [name] [birth year] [id number]  Ortho Devices Type of Ortho Device: CAM walker Ortho Device/Splint Location: lle Ortho Device/Splint Interventions: Application   Post Interventions Patient Tolerated: Well Instructions Provided: Care of device   [name] 02/03/2018, 10:06 AM

## 2018-02-03 NOTE — Progress Notes (Signed)
Pharmacy Antibiotic Note  [name] [name] is a 52 y.o. female admitted on 01/31/2018 with cellulitis.  Pharmacy has been consulted for Vancomycin dosing.  ID: Vanc/CTX/Flagyl D#4 for cellulitis and chronic R plantar foot ulcer - Afebrile, WBC WNL, SCr 0.61 stable, LA 1.63 - - 10/17: L 4th toe amputation  Vanc 10/15>> CTX 10/15>> Flagyl 10/15>> Clinda PTA  10/15 BCx - NGTD 10/16: MRSA PCR negative  Plan: Continue Vanc 1250mg  IV Q12H CTX 2gm IV Q24H and Flagyl 500mg  IV Q8H per MD Holding off on trough as hopefully DC antibiotics after amputation F/u Kcl supplementation   Height: 5' 4.02" (162.6 cm) Weight: 230 lb (104.3 kg) IBW/kg (Calculated) : 54.74  Temp (24hrs), Avg:98.1 F (36.7 C), Min:97.7 F (36.5 C), Max:98.5 F (36.9 C)  Recent Labs  Lab 01/31/18 0248 01/31/18 0255 01/31/18 0504 02/01/18 0210 02/02/18 0244 02/03/18 0154  WBC 7.2  --   --  6.4 5.7 7.2  CREATININE 0.69  --   --  0.62 0.58 0.61  LATICACIDVEN  --  1.31 1.63  --   --   --     Estimated Creatinine Clearance: 94.5 mL/min (by C-G formula based on SCr of 0.61 mg/dL).    Allergies  Allergen Reactions  . Penicillins Hives and Rash    Has patient had a PCN reaction causing immediate rash, facial/tongue/throat swelling, SOB or lightheadedness with hypotension: Yes Has patient had a PCN reaction causing severe rash involving mucus membranes or skin necrosis: No Has patient had a PCN reaction that required hospitalization: No Has patient had a PCN reaction occurring within the last 10 years: No If all of the above answers are "NO", then may proceed with Cephalosporin use.  . Tetracyclines & Related Other (See Comments)    Syncope and put her "in a coma"  . Tramadol Other (See Comments)    Seizures  . Phenazopyridine Other (See Comments)    Unknown  . Tetracycline Other (See Comments)    Syncope and "Put me in a coma"  . Ciprofloxacin Rash and Itching  . Codeine Itching and Rash  . Estradiol  Rash    Patches broke out the skin    [name], PharmD, BCPS Clinical Staff Pharmacist [phone]  Eilene Ghazi [facility] 02/03/2018 11:41 AM

## 2018-02-03 NOTE — Progress Notes (Signed)
Subjective: POD #1 s/p left 4th toe irritation.  She states that she is doing well she is having no pain.  She states that she is ready to go home. Denies any systemic complaints such as fevers, chills, nausea, vomiting. No acute changes since last appointment, and no other complaints at this time.   Objective: AAO x3, NAD DP/PT pulses palpable bilaterally, CRT less than 3 seconds There is some mild bloody strikethrough.  Upon removal of the bandage the wound was evaluated.  One suture had come loose from the incision site but otherwise incisions are intact to the incision is well coapted.  There is no drainage or pus identified with no active bleeding.  There is no swelling erythema, ascending cellulitis.  There is no fluctuation or crepitation or any malodor.  No pain in the surgical site although she has neuropathy.  Cellulitis of the left fifth toe is greatly improved and the wound is healing well.  There is no drainage pus there is no fluctuation or crepitation or malodor or any ascending sialitis.  There is no increase in warmth, edema, erythema to the dorsal foot.  Overall she is much improved no pain with calf compression, swelling, warmth, erythema  Assessment: POD #1 s/p left fourth toe amputation with healing wound left fifth toe with resolving cellulitis  Plan: -All treatment options discussed with the patient including all alternatives, risks, complications.  -X-rays were reviewed.  Lab work was reviewed as well.  I change the bandage.  Betadine was painted over the incision followed by Adaptic and a dry sterile dressing.  Keep the pain is clean, dry, intact. -Minimal weightbearing in Cam boot. -Encourage elevation. -At this point she is doing well the cellulitis is much improved.  From my standpoint she is able to be discharged home with 10 days of Bactrim.  I will follow-up with her in the office on Monday and I will call her tomorrow with the appointment time.  Celesta Gentile,  DPM

## 2018-02-04 ENCOUNTER — Telehealth: Payer: Self-pay | Admitting: Podiatry

## 2018-02-04 NOTE — Telephone Encounter (Signed)
Called patient to see how she was doing after discharge from the hospital last night. She is doing well. She seemed distracted as the baby was crying in the background but overall she is doing well. Some mild pain behind where the surgery was done.   I informed her of a postop appointment. Will have her come in at 11:15 on Monday and she verbalized understanding.   Encouraged to call with any questions or concerns  [name]

## 2018-02-05 LAB — CULTURE, BLOOD (ROUTINE X 2)
Culture: NO GROWTH
Culture: NO GROWTH
Special Requests: ADEQUATE
Special Requests: ADEQUATE

## 2018-02-06 ENCOUNTER — Encounter: Payer: Self-pay | Admitting: Podiatry

## 2018-02-06 ENCOUNTER — Encounter: Payer: 59 | Admitting: Podiatry

## 2018-02-06 ENCOUNTER — Ambulatory Visit (INDEPENDENT_AMBULATORY_CARE_PROVIDER_SITE_OTHER): Payer: 59 | Admitting: Podiatry

## 2018-02-06 VITALS — Temp 99.1°F

## 2018-02-06 DIAGNOSIS — M869 Osteomyelitis, unspecified: Secondary | ICD-10-CM

## 2018-02-06 DIAGNOSIS — Z9889 Other specified postprocedural states: Secondary | ICD-10-CM

## 2018-02-08 NOTE — Progress Notes (Signed)
Subjective: [name] [name] is a 52 y.o. is seen today in office s/p left fourth toe amputation preformed on 02/01/2018.  She states that she is doing well she is not having any pain.  She is wearing a surgical shoe.  She is been trying to limit activity but she has been walking in the boot.  She did take the antibiotics as directed.  Denies any systemic complaints such as fevers, chills, nausea, vomiting. No calf pain, chest pain, shortness of breath.   Objective: General: No acute distress, AAOx3  DP/PT pulses palpable 2/4, CRT < 3 sec to all digits.  LEFT foot: Incision is well coapted without any evidence of dehiscence and sutures are intact.  There is one area where the stitch should come loose in the hospital that is granulating and well.  There is no drainage or pus identified there is no edema, erythema.  There is no fluctuation or crepitation or any clinical signs of infection. On the left fifth toe there is significantly improved erythema and the wound appears to be almost healed at this point. No other open lesions or pre-ulcerative lesions.  No pain with calf compression, swelling, warmth, erythema.   Assessment and Plan:  Status post left foot fourth digit amputation, doing well with no complications   -Treatment options discussed including all alternatives, risks, and complications -Incision appears to be healing well.  Betadine skin incision followed by a nonadherent and a dry sterile dressing. -Remain in surgical shoe to limit activity -Finish course of antibiotics -Ice/elevation -Pain medication as needed. -Monitor for any clinical signs or symptoms of infection and DVT/PE and directed to call the office immediately should any occur or go to the ER. -Follow-up in 10 days or sooner if any problems arise. In the meantime, encouraged to call the office with any questions, concerns, change in symptoms.   Celesta Gentile, DPM

## 2018-02-13 ENCOUNTER — Ambulatory Visit (INDEPENDENT_AMBULATORY_CARE_PROVIDER_SITE_OTHER): Payer: 59

## 2018-02-13 ENCOUNTER — Ambulatory Visit (INDEPENDENT_AMBULATORY_CARE_PROVIDER_SITE_OTHER): Payer: 59 | Admitting: Podiatry

## 2018-02-13 ENCOUNTER — Encounter: Payer: Self-pay | Admitting: Podiatry

## 2018-02-13 VITALS — Temp 98.9°F

## 2018-02-13 DIAGNOSIS — L03032 Cellulitis of left toe: Secondary | ICD-10-CM | POA: Diagnosis not present

## 2018-02-13 DIAGNOSIS — Z9889 Other specified postprocedural states: Secondary | ICD-10-CM

## 2018-02-13 DIAGNOSIS — L02612 Cutaneous abscess of left foot: Secondary | ICD-10-CM

## 2018-02-13 DIAGNOSIS — M86172 Other acute osteomyelitis, left ankle and foot: Secondary | ICD-10-CM

## 2018-02-14 ENCOUNTER — Ambulatory Visit: Payer: 59 | Admitting: Family Medicine

## 2018-02-16 ENCOUNTER — Encounter: Payer: 59 | Admitting: Podiatry

## 2018-02-16 NOTE — Progress Notes (Signed)
Subjective: [name] [name] is a 52 y.o. is seen today in office s/p left fourth toe amputation preformed on 02/01/2018.  Overall she states that she is doing well but she did start to have some mild discomfort.  She is a started having pain over the last day or so.  She denies any increase in swelling or any redness or drainage or any pus.  She is been wearing a surgical shoe.  She has no other concerns today. Denies any systemic complaints such as fevers, chills, nausea, vomiting. No calf pain, chest pain, shortness of breath.   Objective: General: No acute distress, AAOx3  DP/PT pulses palpable 2/4, CRT < 3 sec to all digits.  LEFT foot: Incision is well coapted without any evidence of dehiscence and sutures are intact.  There is very minimal swelling to the surgical site there is no swelling erythema or ascending cellulitis.  There is no fluctuation or crepitation or any malodor and appears to be healing well.  No tenderness palpation along the surgical site today.  I think she is getting some more nerve type pain. No other open lesions or pre-ulcerative lesions.  No pain with calf compression, swelling, warmth, erythema.   Assessment and Plan:  Status post left foot fourth digit amputation, doing well with no complications   -Treatment options discussed including all alternatives, risks, and complications -X-rays were obtained reviewed.  No evidence of acute fracture or evidence of osteomyelitis.  There is soft tissue emphysema. -In surgical shoe.  Betadine was painted over the incision followed by a dry sterile dressing.  She can continue daily dressing changes at home.  Encouraged elevation. -Monitor for any clinical signs or symptoms of infection and DVT/PE and directed to call the office immediately should any occur or go to the ER. -Follow-up as scheduled for suture removal or sooner if any problems arise. In the meantime, encouraged to call the office with any questions, concerns,  change in symptoms.   Celesta Gentile, DPM

## 2018-02-20 ENCOUNTER — Encounter: Payer: Self-pay | Admitting: Family Medicine

## 2018-02-20 ENCOUNTER — Ambulatory Visit (INDEPENDENT_AMBULATORY_CARE_PROVIDER_SITE_OTHER): Payer: 59 | Admitting: Family Medicine

## 2018-02-20 ENCOUNTER — Encounter: Payer: Self-pay | Admitting: Podiatry

## 2018-02-20 ENCOUNTER — Ambulatory Visit (INDEPENDENT_AMBULATORY_CARE_PROVIDER_SITE_OTHER): Payer: 59 | Admitting: Podiatry

## 2018-02-20 VITALS — BP 126/72 | HR 78 | Temp 98.1°F | Resp 18 | Ht 63.5 in | Wt 232.0 lb

## 2018-02-20 DIAGNOSIS — M86172 Other acute osteomyelitis, left ankle and foot: Secondary | ICD-10-CM

## 2018-02-20 DIAGNOSIS — Z9889 Other specified postprocedural states: Secondary | ICD-10-CM

## 2018-02-20 DIAGNOSIS — S98132S Complete traumatic amputation of one left lesser toe, sequela: Secondary | ICD-10-CM | POA: Diagnosis not present

## 2018-02-20 DIAGNOSIS — I10 Essential (primary) hypertension: Secondary | ICD-10-CM | POA: Diagnosis not present

## 2018-02-20 DIAGNOSIS — Z23 Encounter for immunization: Secondary | ICD-10-CM

## 2018-02-20 DIAGNOSIS — Z09 Encounter for follow-up examination after completed treatment for conditions other than malignant neoplasm: Secondary | ICD-10-CM | POA: Diagnosis not present

## 2018-02-20 NOTE — Progress Notes (Signed)
Subjective:    Patient ID: [name] [name], female    DOB: 06-28-1965, 52 y.o.   MRN: [id number]  HPI Patient was recently admitted to the hospital with cellulitis in her left fifth toe.  She was treated with Bactrim as an outpatient however the erythema began to spread onto the adjacent toe and up her leg.  The podiatrist who was treating her referred her to the emergency room having failed outpatient therapy.  In the hospital, blood cultures returned positive for staph.  X-rays were reportedly negative for osteomyelitis.  The patient was placed on IV antibiotics.  The infection improved in her left fifth toe however there was continuing and spreading erythema in the fourth toe of the foot and therefore the fourth toe was amputated due to concern over being an infectious source not responding to antibiotics.  She was discharged home and has completed an outpatient course of antibiotics.  She is here today for follow-up.  In the hospital, her hemoglobin dropped from 11-10.  Her potassium was also low however they discontinued her hydrochlorothiazide due to hypotension.  Today she is not taking hydrochlorothiazide.  She is taking her potassium tablets.  Her blood pressure is well controlled at 126/72.  She is due to recheck a CMP to monitor potassium and her lab work as well as a CBC to monitor for leukocytosis as well as improvement in her anemia Past Medical History:  Diagnosis Date  . Anxiety   . Asthma    mild intermittent  . Bipolar 1 disorder ([place])    ect treatments last treatment Sep 02 1011  . Depression   . GERD (gastroesophageal reflux disease)   . Hypertension   . Pre-diabetes   . Seizures ([place])    last seizure was so long ago she can't remember.   . Sleep apnea    wears CPAP, uncertain of setting   Past Surgical History:  Procedure Laterality Date  . ABDOMINAL HYSTERECTOMY    . AMPUTATION TOE Left 02/02/2018   Procedure: AMPUTATION TOE Left 4th toe;  Surgeon: [name], DPM;  Location: [place];  Service: Podiatry;  Laterality: Left;  . BACK SURGERY    . CARPAL TUNNEL RELEASE     x2  . Laproscopic knee surgery    . NECK SURGERY  03/15/2017  . PLANTAR FASCIA RELEASE     x2   Current Outpatient Medications on File Prior to Visit  Medication Sig Dispense Refill  . busPIRone (BUSPAR) 15 MG tablet Take 15 mg by mouth 3 (three) times daily.   2  . Calcium Carbonate-Vit D-Min (CALCIUM 1200 PO) Take 1 tablet by mouth daily.    . citalopram (CELEXA) 40 MG tablet Take 40 mg by mouth at bedtime.     . clotrimazole (LOTRIMIN) 1 % cream Apply 1 application topically 2 (two) times daily. 113 g 2  . cyclobenzaprine (FLEXERIL) 10 MG tablet Take 10 mg by mouth 2 (two) times daily as needed for muscle spasms.     . Dextran 70-Hypromellose, PF, (ARTIFICIAL TEARS PF) 0.1-0.3 % SOLN Place 1-2 drops into both eyes 3 (three) times daily as needed (for dry eyes).    . EMBEDA 30-1.2 MG CPCR Take 1 capsule by mouth 2 (two) times daily.     Marland Kitchen estradiol (ESTRACE) 1 MG tablet Take 1 tablet (1 mg total) by mouth daily. 90 tablet 3  . fluticasone (FLONASE) 50 MCG/ACT nasal spray Place 2 sprays into both nostrils daily as needed  for allergies. 48 g 1  . gabapentin (NEURONTIN) 600 MG tablet Take 1 tablet (600 mg total) by mouth 3 (three) times daily. For anxiety and pain management. May have to stop to take ECT. (Patient taking differently: Take 600 mg by mouth 4 (four) times daily. For anxiety and pain management) 90 tablet 0  . haloperidol (HALDOL) 5 MG tablet Take 1 tablet (5 mg total) by mouth at bedtime. For psychosis    . levocetirizine (XYZAL) 5 MG tablet TAKE ONE TABLET BY MOUTH EVERY EVENING 30 tablet 5  . lidocaine (XYLOCAINE) 5 % ointment Apply 1-2 grams (1 gram = 1 inch) to the affected area 2-3 times daily for 30 days.  1  . LINZESS 290 MCG CAPS capsule TAKE ONE CAPSULE BY MOUTH DAILY BEFORE BREAKFAST 30 capsule 11  . meloxicam (MOBIC) 15 MG tablet Take 15 mg by mouth daily.   2  . metoprolol succinate (TOPROL XL) 25 MG 24 hr tablet Take 0.5 tablets (12.5 mg total) by mouth daily. 15 tablet 6  . Multiple Vitamins-Minerals (MULTIVITAMIN WITH MINERALS) tablet Take 1 tablet by mouth daily.    Marland Kitchen oxyCODONE-acetaminophen (PERCOCET) 10-325 MG tablet Take 1 tablet by mouth 3 (three) times daily.    . pantoprazole (PROTONIX) 40 MG tablet TAKE ONE TABLET BY MOUTH TWICE DAILY 180 tablet 2  . potassium chloride SA (KLOR-CON M20) 20 MEQ tablet Take 2 tablets (40 mEq total) by mouth daily. (Patient taking differently: Take 20 mEq by mouth 2 (two) times daily. ) 180 tablet 3  . PROAIR HFA 108 (90 Base) MCG/ACT inhaler Inhale 2 puffs = 11mcg into the lungs every 6 (six) hours as needed for wheezing or shortness of breath. 8.5 g 3  . promethazine (PHENERGAN) 25 MG tablet Take 1 tablet (25 mg total) by mouth every 8 (eight) hours as needed for nausea or vomiting. 20 tablet 0  . topiramate (TOPAMAX) 50 MG tablet TAKE ONE TABLET BY MOUTH TWICE DAILY 60 tablet 11  . traZODone (DESYREL) 100 MG tablet Take 100 mg by mouth at bedtime.  0   No current facility-administered medications on file prior to visit.    Allergies  Allergen Reactions  . Penicillins Hives and Rash    Has patient had a PCN reaction causing immediate rash, facial/tongue/throat swelling, SOB or lightheadedness with hypotension: Yes Has patient had a PCN reaction causing severe rash involving mucus membranes or skin necrosis: No Has patient had a PCN reaction that required hospitalization: No Has patient had a PCN reaction occurring within the last 10 years: No If all of the above answers are "NO", then may proceed with Cephalosporin use.  . Tetracyclines & Related Other (See Comments)    Syncope and put her "in a coma"  . Tramadol Other (See Comments)    Seizures  . Phenazopyridine Other (See Comments)    Unknown  . Tetracycline Other (See Comments)    Syncope and "Put me in a coma"  . Ciprofloxacin Rash and  Itching  . Codeine Itching and Rash  . Estradiol Rash    Patches broke out the skin   Social History   Socioeconomic History  . Marital status: Legally Separated    Spouse name: Not on file  . Number of children: 3  . Years of education: 15  . Highest education level: Not on file  Occupational History  . Occupation: Disabled  Social Needs  . Financial resource strain: Not on file  . Food insecurity:  Worry: Not on file    Inability: Not on file  . Transportation needs:    Medical: Not on file    Non-medical: Not on file  Tobacco Use  . Smoking status: Never Smoker  . Smokeless tobacco: Never Used  Substance and Sexual Activity  . Alcohol use: No  . Drug use: No  . Sexual activity: Not on file  Lifestyle  . Physical activity:    Days per week: Not on file    Minutes per session: Not on file  . Stress: Not on file  Relationships  . Social connections:    Talks on phone: Not on file    Gets together: Not on file    Attends religious service: Not on file    Active member of club or organization: Not on file    Attends meetings of clubs or organizations: Not on file    Relationship status: Not on file  . Intimate partner violence:    Fear of current or ex partner: Not on file    Emotionally abused: Not on file    Physically abused: Not on file    Forced sexual activity: Not on file  Other Topics Concern  . Not on file  Social History Narrative   Patient drink 4 16oz caffeine drinks a day       Review of Systems  All other systems reviewed and are negative.      Objective:   Physical Exam  Constitutional: She is oriented to person, place, and time. She appears well-developed and well-nourished. No distress.  HENT:  Head: Normocephalic and atraumatic.  Mouth/Throat: No oropharyngeal exudate.  Eyes: Pupils are equal, round, and reactive to light. Conjunctivae are normal.  Neck: No JVD present. No thyromegaly present.  Cardiovascular: Normal rate, regular  rhythm, normal heart sounds and intact distal pulses. Exam reveals no gallop and no friction rub.  No murmur heard. Pulmonary/Chest: Effort normal and breath sounds normal. No stridor. No respiratory distress. She has no wheezes. She has no rales. She exhibits no tenderness.  Abdominal: Soft. Bowel sounds are normal. She exhibits no distension and no mass. There is no tenderness. There is no rebound and no guarding. No hernia.  Musculoskeletal: She exhibits deformity. She exhibits no edema.  Lymphadenopathy:    She has no cervical adenopathy.  Neurological: She is alert and oriented to person, place, and time. She displays normal reflexes. No cranial nerve deficit or sensory deficit. She exhibits normal muscle tone.  Skin: She is not diaphoretic.  Vitals reviewed. Patient has seen a podiatrist who redressed her left foot today.  I change the dressing again just so I can examine.  She is missing fourth toe distal to the fourth MTP joint on her left foot.  The wound is clean dry and intact.  There is no erythema warmth or swelling.  There is no tenderness to palpation.  She has normal pulses.        Assessment & Plan:  Benign essential HTN - Plan: CBC with Differential/Platelet, COMPLETE METABOLIC PANEL WITH GFR, Lipid panel  Amputation of fourth toe, left, traumatic, sequela ([name]) - Plan: Tdap vaccine greater than or equal to 7yo IM  Hospital discharge follow-up  Blood pressure today is well controlled.  I see no reason for her to resume hydrochlorothiazide.  I will check a CBC and a CMP to monitor her potassium as well as her hemoglobin after her recent hospitalization.  There is no evidence of residual cellulitis in her  left foot.  I did give the patient a tetanus vaccine today given the fact she is recently had an amputation of her left fourth toe due to an uncontrolled infection.  Flu shot is up-to-date.  Cancer screening is up-to-date.  Patient also request that we check her cholesterol  while drawing lab work.

## 2018-02-21 LAB — CBC WITH DIFFERENTIAL/PLATELET
Basophils Absolute: 27 cells/uL (ref 0–200)
Basophils Relative: 0.4 %
Eosinophils Absolute: 238 cells/uL (ref 15–500)
Eosinophils Relative: 3.5 %
HCT: 35.3 % (ref 35.0–45.0)
Hemoglobin: 12.2 g/dL (ref 11.7–15.5)
Lymphs Abs: 2740 cells/uL (ref 850–3900)
MCH: 32.7 pg (ref 27.0–33.0)
MCHC: 34.6 g/dL (ref 32.0–36.0)
MCV: 94.6 fL (ref 80.0–100.0)
MPV: 9.5 fL (ref 7.5–12.5)
Monocytes Relative: 5.3 %
Neutro Abs: 3434 cells/uL (ref 1500–7800)
Neutrophils Relative %: 50.5 %
Platelets: 279 10*3/uL (ref 140–400)
RBC: 3.73 10*6/uL — ABNORMAL LOW (ref 3.80–5.10)
RDW: 12.7 % (ref 11.0–15.0)
Total Lymphocyte: 40.3 %
WBC mixed population: 360 cells/uL (ref 200–950)
WBC: 6.8 10*3/uL (ref 3.8–10.8)

## 2018-02-21 LAB — COMPLETE METABOLIC PANEL WITH GFR
AG Ratio: 1.3 (calc) (ref 1.0–2.5)
ALT: 11 U/L (ref 6–29)
AST: 17 U/L (ref 10–35)
Albumin: 3.9 g/dL (ref 3.6–5.1)
Alkaline phosphatase (APISO): 63 U/L (ref 33–130)
BUN: 11 mg/dL (ref 7–25)
CO2: 27 mmol/L (ref 20–32)
Calcium: 9.2 mg/dL (ref 8.6–10.4)
Chloride: 105 mmol/L (ref 98–110)
Creat: 0.58 mg/dL (ref 0.50–1.05)
GFR, Est African American: 121 mL/min/{1.73_m2} (ref >=60)
GFR, Est Non African American: 104 mL/min/{1.73_m2} (ref >=60)
Globulin: 3 g/dL (calc) (ref 1.9–3.7)
Glucose, Bld: 77 mg/dL (ref 65–99)
Potassium: 3.9 mmol/L (ref 3.5–5.3)
Sodium: 139 mmol/L (ref 135–146)
Total Bilirubin: 0.4 mg/dL (ref 0.2–1.2)
Total Protein: 6.9 g/dL (ref 6.1–8.1)

## 2018-02-21 LAB — LIPID PANEL
Cholesterol: 167 mg/dL (ref <200)
HDL: 55 mg/dL (ref >50)
LDL Cholesterol (Calc): 81 mg/dL (calc)
Non-HDL Cholesterol (Calc): 112 mg/dL (calc) (ref <130)
Total CHOL/HDL Ratio: 3 (calc) (ref <5.0)
Triglycerides: 211 mg/dL — ABNORMAL HIGH (ref <150)

## 2018-02-21 IMAGING — CT CT ABD-PELV W/ CM
2 of 5 series · 17 of 46 positions shown, 19 images · IV contrast (APPLIED)
Comparison: CT abdomen pelvis 02/06/2015

CLINICAL DATA: Patient with constipation and abdominal pain.
Evaluate for diverticulitis.

EXAM:
CT ABDOMEN AND PELVIS WITH CONTRAST
TECHNIQUE: Multidetector CT imaging of the abdomen and pelvis was performed
using the standard protocol following bolus administration of
intravenous contrast.
CONTRAST:  100mL KCQBAH-EKK IOPAMIDOL (KCQBAH-EKK) INJECTION 61%

[Series 3: abd/ pelvis 5.0 i30f 2 · axial · 0.98mm/px · z∈[+746,+1176]mm · 14 of 98 slices shown, 16 images]
[im 6/98  soft-tissue]
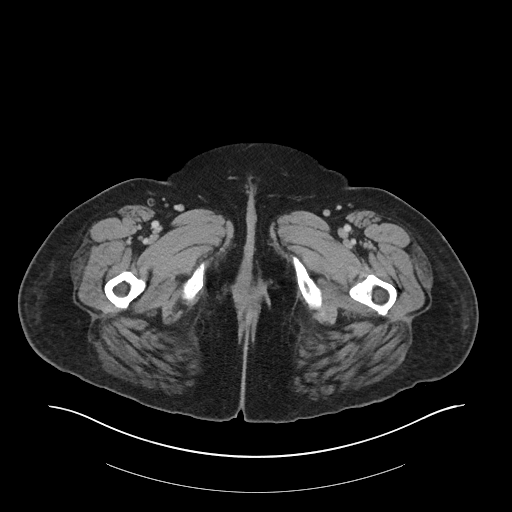
[im 6/98  bone]
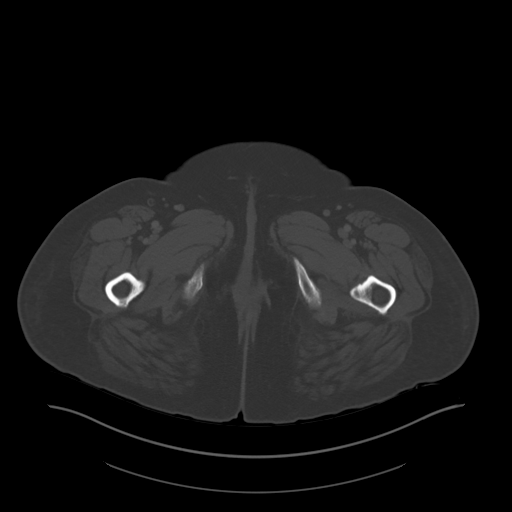
[im 11/98  soft-tissue]
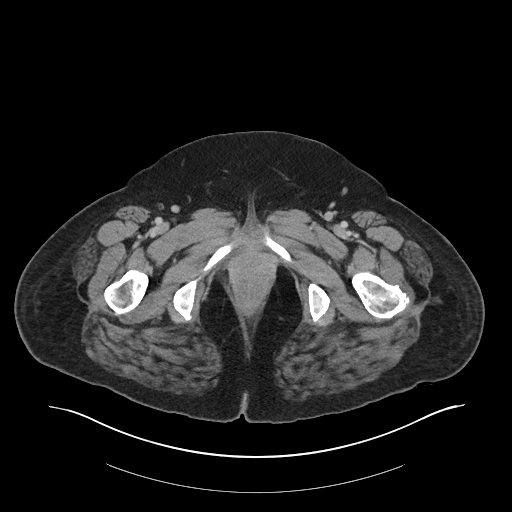
[im 22/98  soft-tissue]
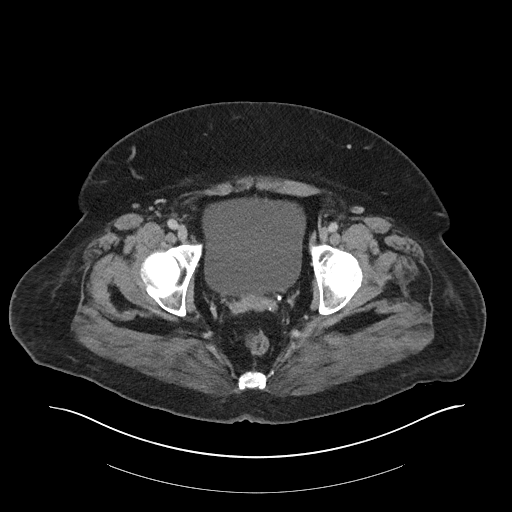
[im 27/98  soft-tissue]
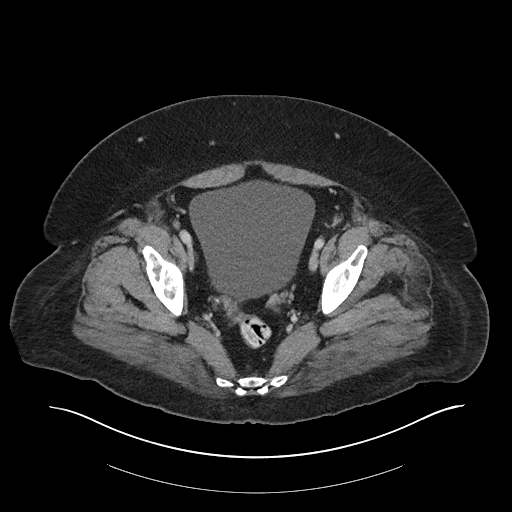
[im 33/98  soft-tissue]
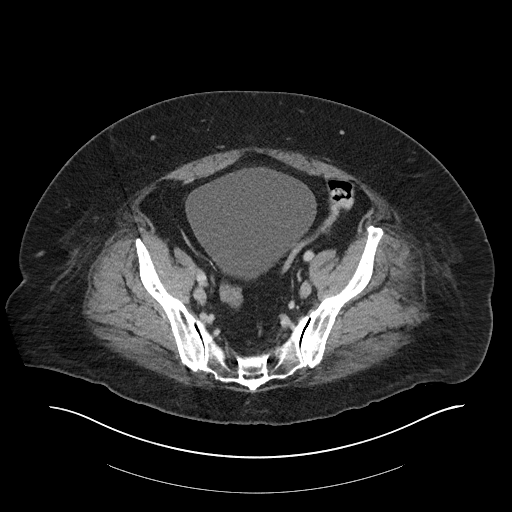
[im 38/98  soft-tissue]
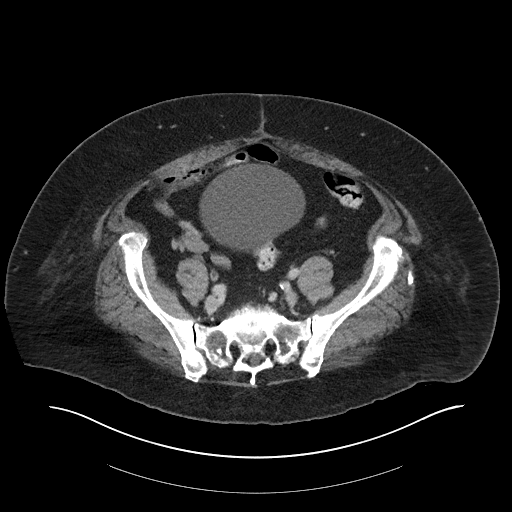
[im 44/98  soft-tissue]
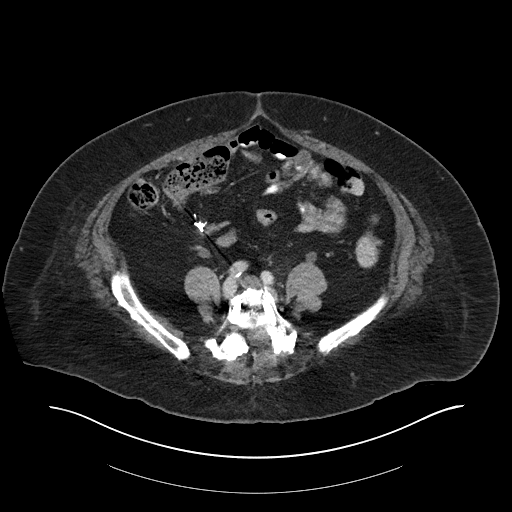
[im 54/98  soft-tissue]
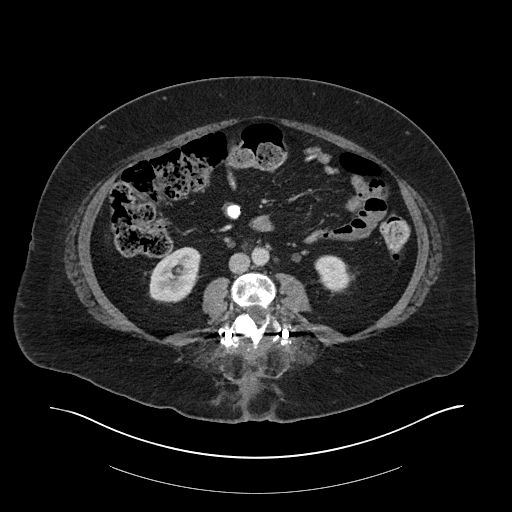
[im 60/98  soft-tissue]
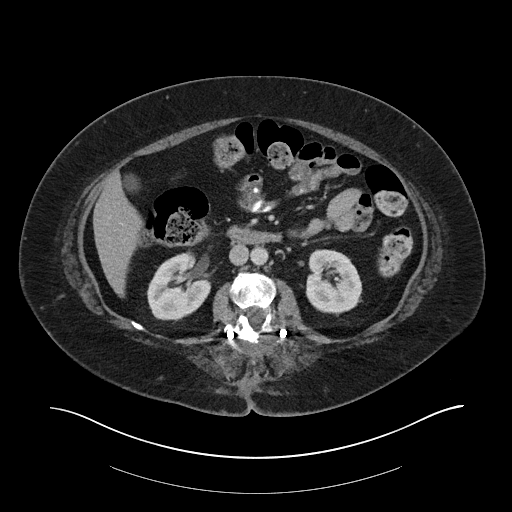
[im 60/98  bone]
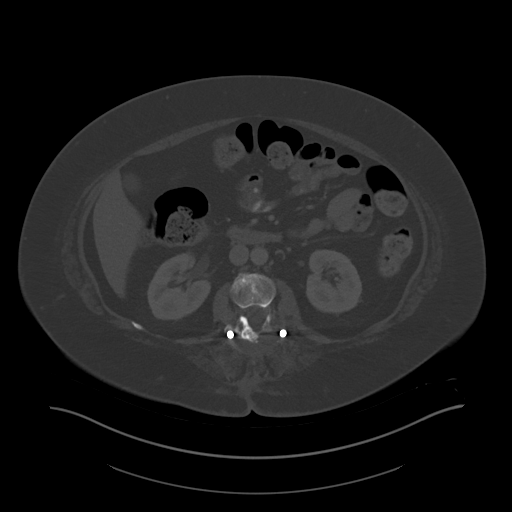
[im 65/98  soft-tissue]
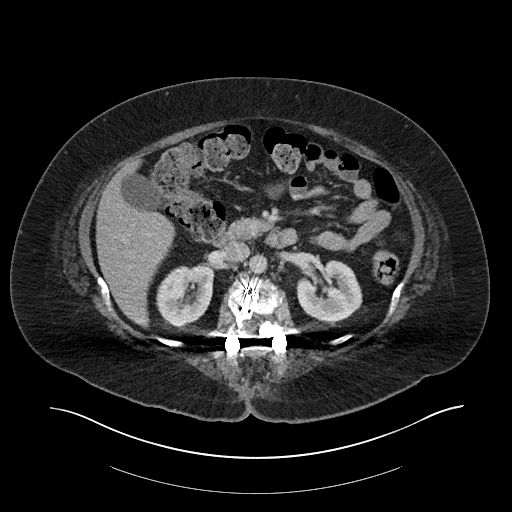
[im 71/98  soft-tissue]
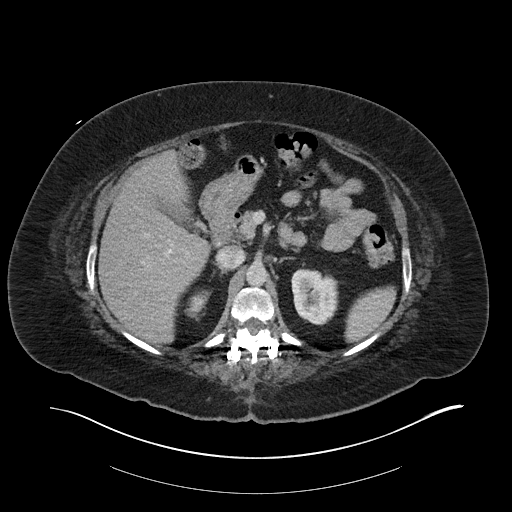
[im 76/98  soft-tissue]
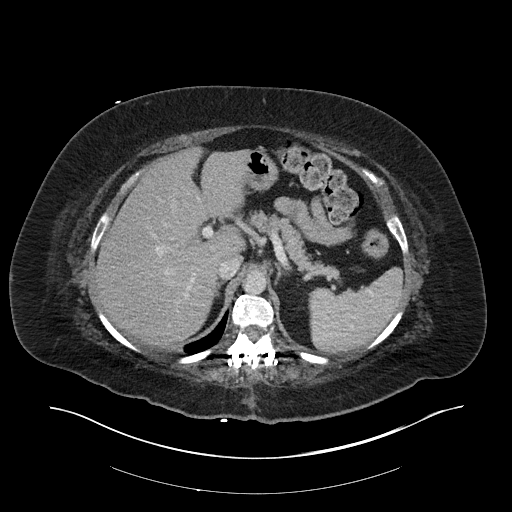
[im 87/98  soft-tissue]
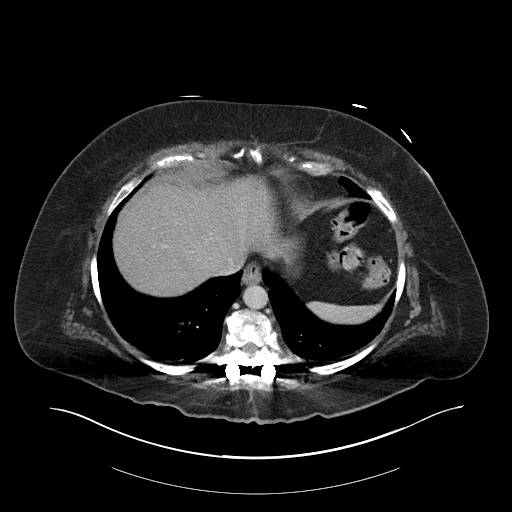
[im 92/98  soft-tissue]
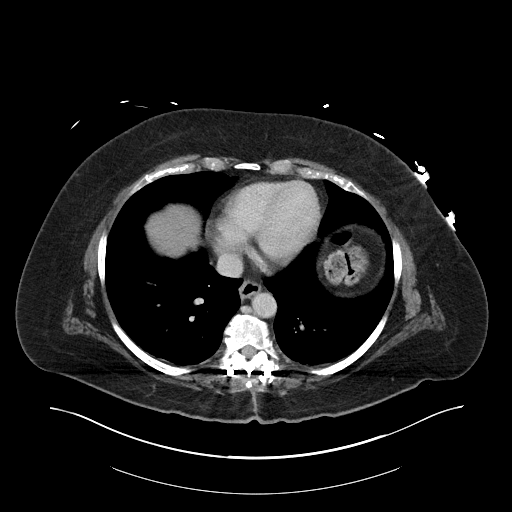

[Series 5: coronal soft tissue · coronal · 0.96mm/px · 3 of 121 slices shown]
[im 41/121  soft-tissue]
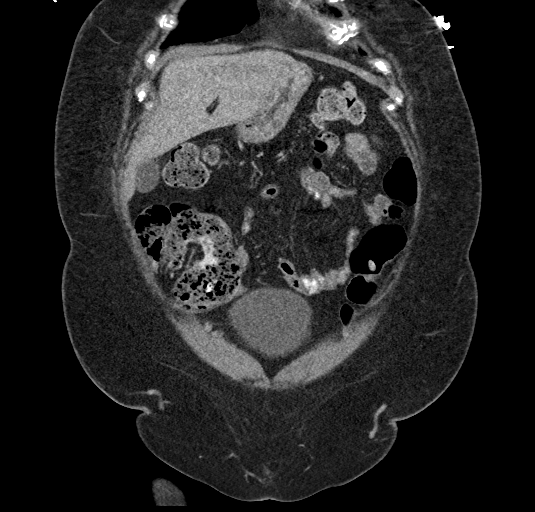
[im 54/121  soft-tissue]
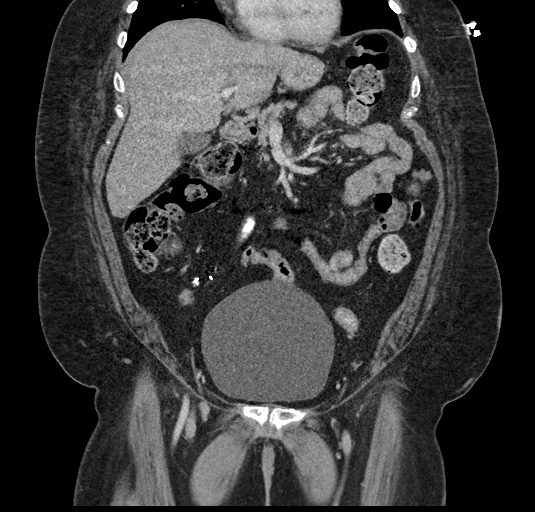
[im 67/121  soft-tissue]
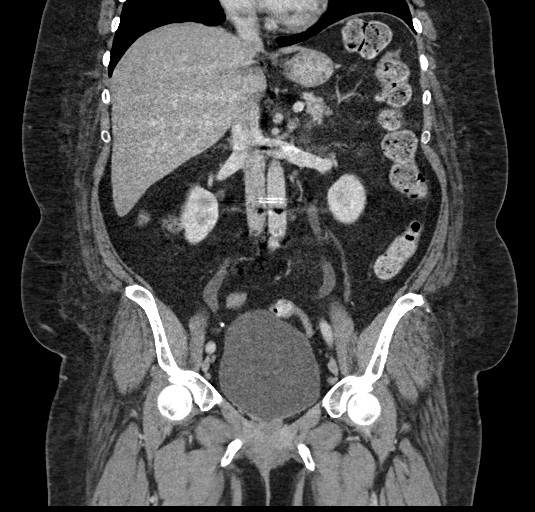

[17 of 46 positions shown; findings below may reference images not displayed]

FINDINGS: Lower chest: Normal heart size. Dependent atelectasis within the
bilateral lower lobes. No pleural effusion.

Hepatobiliary: Liver is normal in size and contour. Gallbladder is
unremarkable. No intrahepatic or extrahepatic biliary ductal
dilatation.

Pancreas: Unremarkable

Spleen: Unremarkable

Adrenals/Urinary Tract: Normal adrenal glands. Kidneys enhance
symmetrically with contrast. No hydronephrosis. Urinary bladder is
unremarkable.

Stomach/Bowel: Descending and sigmoid colonic diverticulosis. No CT
evidence for acute diverticulitis. No evidence for small bowel
obstruction. Normal morphology of the stomach. No free fluid or free
intraperitoneal air. Postsurgical clips within the right lower
quadrant.

Vascular/Lymphatic: Normal caliber abdominal aorta. No
retroperitoneal lymphadenopathy.

Reproductive: Status posthysterectomy.

Other: None.

Musculoskeletal: Thoracolumbar spinal fusion hardware. Mild height
loss of the T9 vertebral body.
IMPRESSION: No acute process within the abdomen or pelvis.

Mild height loss of the T9 vertebral body. Recommend correlation for
point tenderness.

## 2018-02-21 IMAGING — DX DG CHEST 2V
2 series · 2 of 2 positions shown · non-contrast
Comparison: Chest radiograph 09/23/2016

CLINICAL DATA: Patient with syncopal episode. Dizziness. Abdominal
pain.

EXAM:
CHEST  2 VIEW

[chest pa]
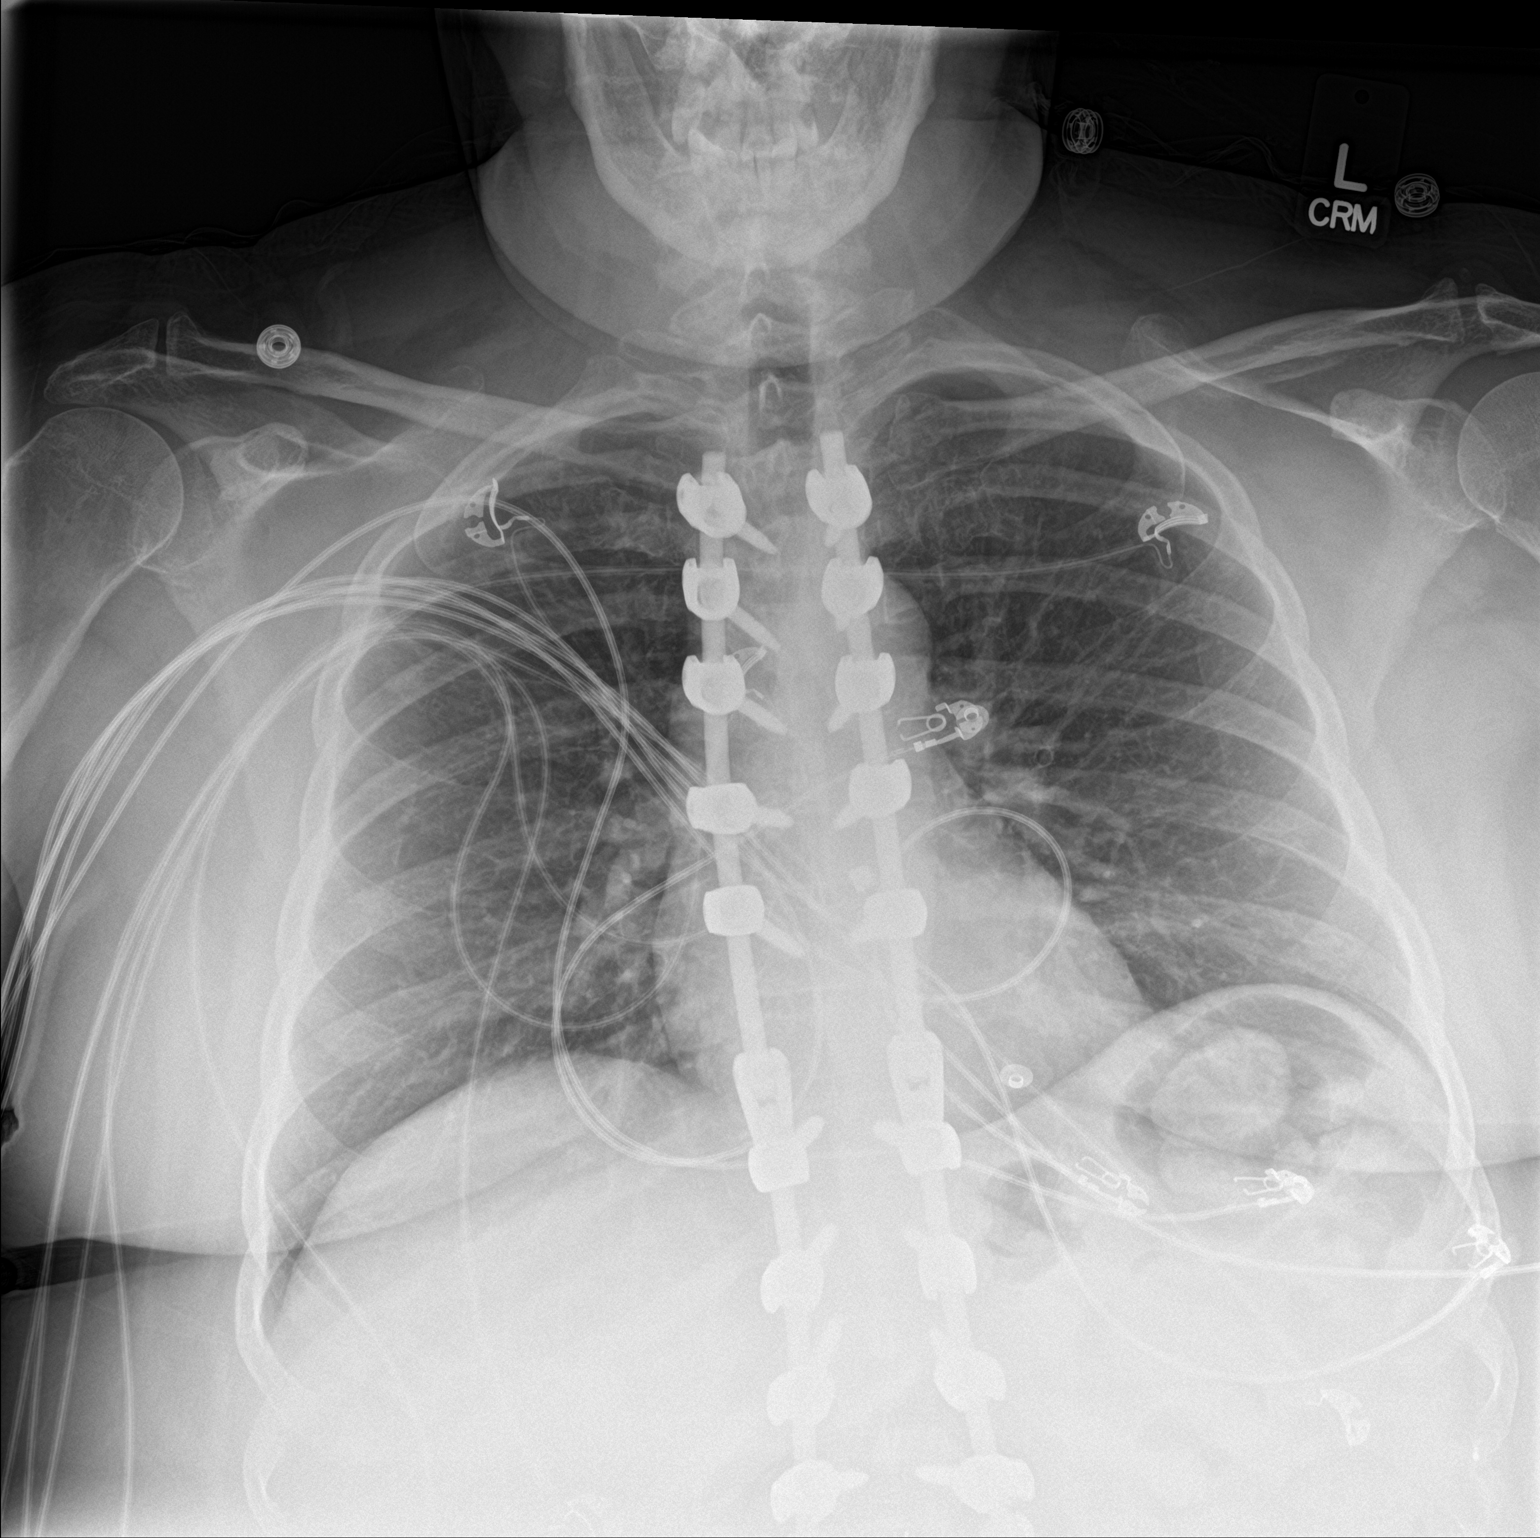

[chest lat]
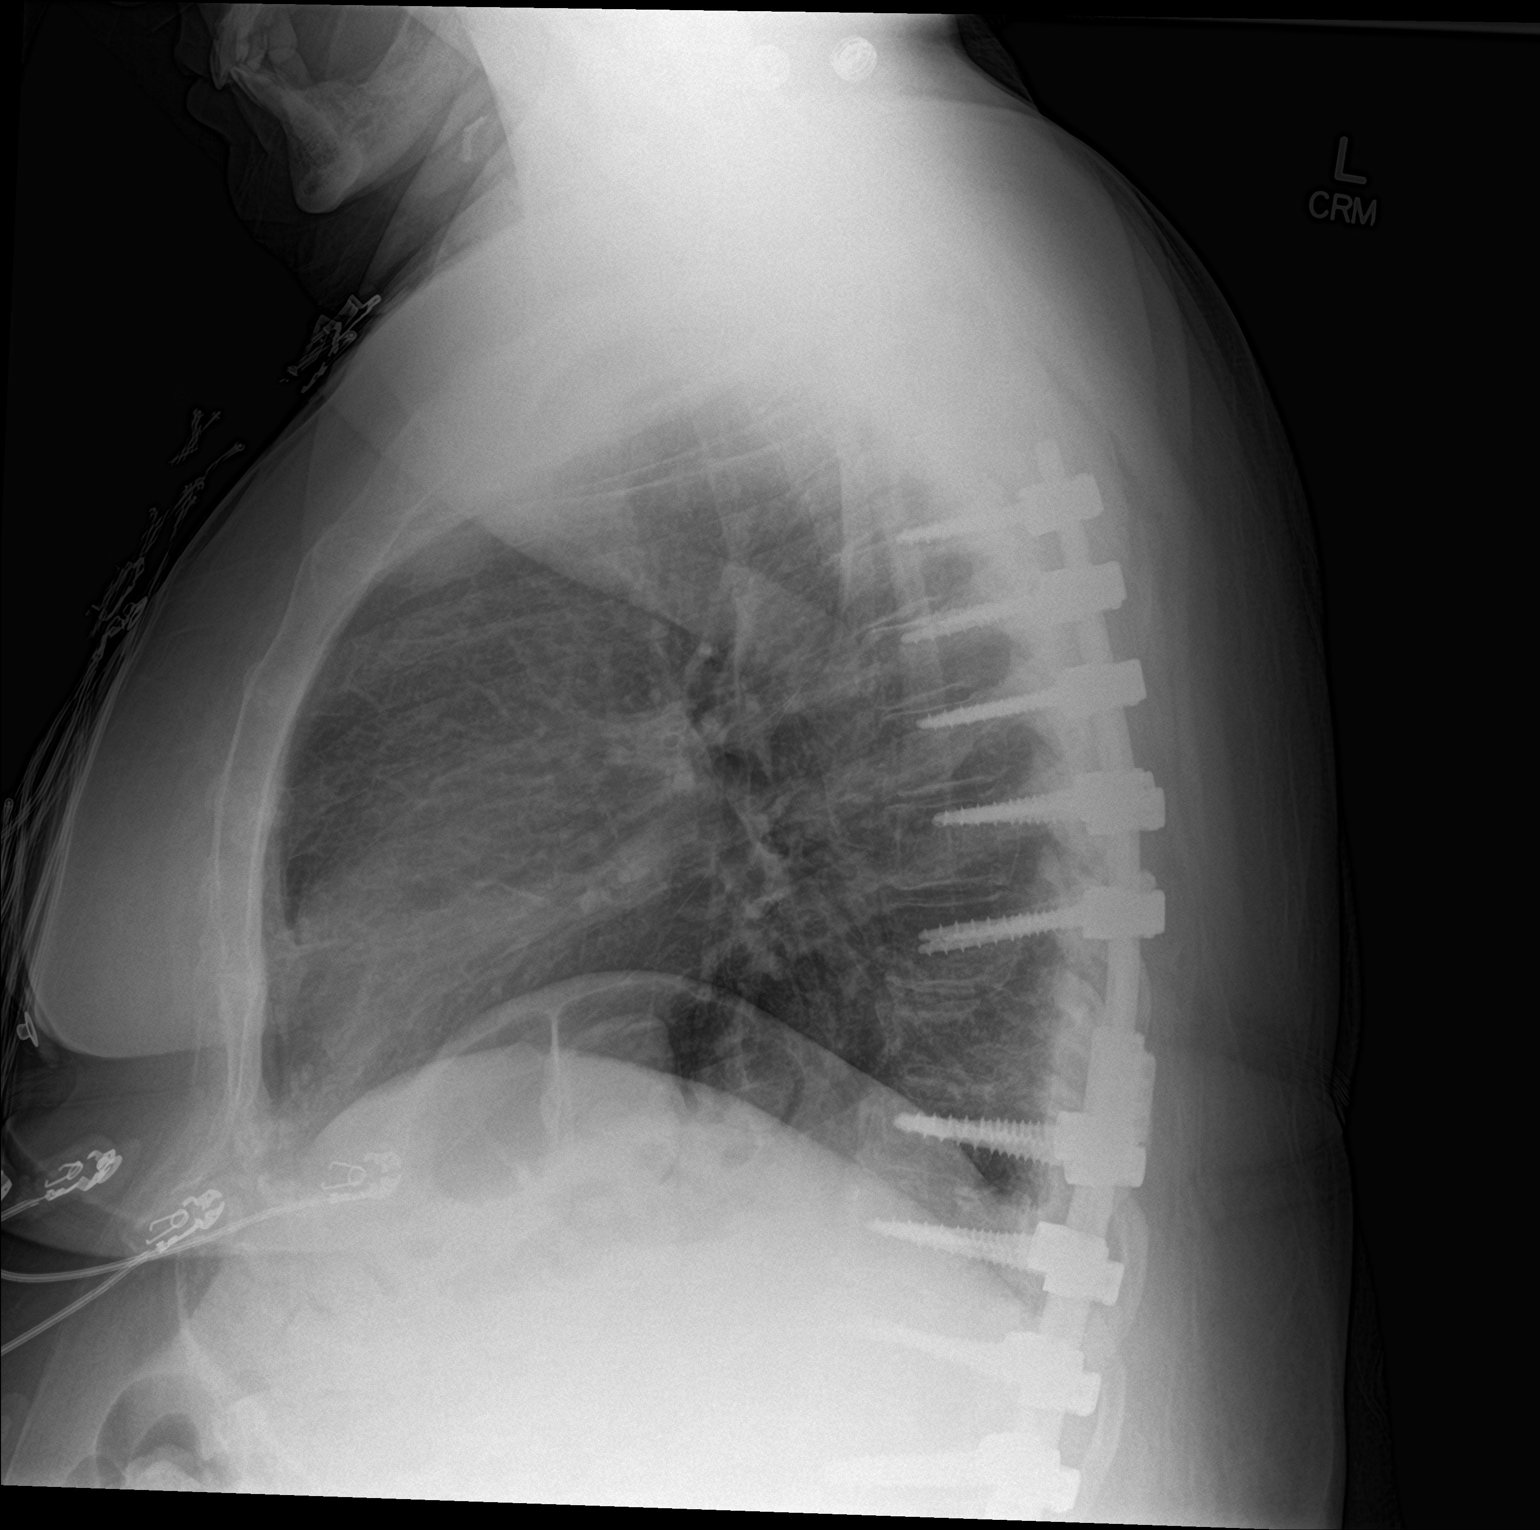

[2 of 2 positions shown; findings below may reference images not displayed]

FINDINGS: Normal cardiac and mediastinal contours. No consolidative pulmonary
opacities. No pleural effusion or pneumothorax. Thoracic spinal
fusion hardware.
IMPRESSION: No acute cardiopulmonary process

## 2018-02-22 NOTE — Progress Notes (Signed)
Subjective: [name] [name] is a 52 y.o. is seen today in office s/p left fourth toe amputation preformed on 02/01/2018.  She states that she is doing well.  She presents today for suture removal.  She is having no pain she denies any swelling or redness.  She is doing the cam boot she been using a walker.  Overall she feels well and she denies any fevers, chills, nausea, vomiting.  Denies any calf pain, chest pain, shortness of breath.   Objective: General: No acute distress, AAOx3  DP/PT pulses palpable 2/4, CRT < 3 sec to all digits.  LEFT foot: Incision is well coapted without any evidence of dehiscence and sutures are intact.  Incision appears to be healing well there is trace edema to the area there is no surrounding erythema, ascending sialitis.  There is no fluctuation or crepitation or any malodor.  The wound of the left fifth toe is healed and there is no edema, erythema identified to this area and there is no other open lesions identified at this time.   No other open lesions or pre-ulcerative lesions.  No pain with calf compression, swelling, warmth, erythema.   Assessment and Plan:  Status post left foot fourth digit amputation, doing well with no complications   -Treatment options discussed including all alternatives, risks, and complications -Sutures removed without complications.  Incision appears to be healing well.  Anabolic ointment and a bandage applied.  She can continue the cam boot for now we will see her back in about a week to 10 days at the point she is doing well we will transition back to regular shoe.  Continue ice elevate.  Daily foot inspection discussed. -Monitor for any clinical signs or symptoms of infection and DVT/PE and directed to call the office immediately should any occur or go to the ER. -Follow-up as scheduled for suture removal or sooner if any problems arise. In the meantime, encouraged to call the office with any questions, concerns, change in symptoms.    Celesta Gentile, DPM

## 2018-03-02 ENCOUNTER — Encounter: Payer: Self-pay | Admitting: Podiatry

## 2018-03-02 ENCOUNTER — Ambulatory Visit (INDEPENDENT_AMBULATORY_CARE_PROVIDER_SITE_OTHER): Payer: 59 | Admitting: Podiatry

## 2018-03-02 DIAGNOSIS — Z9889 Other specified postprocedural states: Secondary | ICD-10-CM

## 2018-03-02 DIAGNOSIS — L97511 Non-pressure chronic ulcer of other part of right foot limited to breakdown of skin: Secondary | ICD-10-CM

## 2018-03-02 DIAGNOSIS — M869 Osteomyelitis, unspecified: Secondary | ICD-10-CM

## 2018-03-04 NOTE — Progress Notes (Signed)
Subjective: [name] [name] is a 52 y.o. is seen today in office s/p left fourth toe amputation preformed on 02/01/2018.  Overall she states that she is doing well the surgical site is healed.  She states that all the wounds have healed the most part.  The right side is starting to scab over and she denies any new sores.  She denies any increase in swelling or redness or any drainage or pus or any other issues.  She is back to wearing regular shoes without any issues. Overall she feels well and she denies any fevers, chills, nausea, vomiting.  Denies any calf pain, chest pain, shortness of breath.   Objective: General: No acute distress, AAOx3  DP/PT pulses palpable 2/4, CRT < 3 sec to all digits.  LEFT foot: Incision is well coapted without any evidence of dehiscence and a scar has formed. The wounds have all healed on the left foot. On the bottom of the right foot the wound is almost healed. It is starting to scab over. There is no edema, erythema, drainage, or pus from any of the areas.  There are no other open lesions identified at this time. No pain with calf compression, swelling, warmth, erythema.   Assessment and Plan:  Status post left foot fourth digit amputation, doing well with no complications   -Treatment options discussed including all alternatives, risks, and complications -Wound on left foot are healed.  There is no signs of infection.  The wound on the right foot is doing much better and almost completely healed as well.  Overall she is doing much better.  I continue a small amount of antibiotic ointment on the wound on the right foot.  There is no signs of infection noted today but continues to monitoring skin breakdown or any signs of infection.  She is going to [place] for the next 3 weeks I will see her when she comes back however I encouraged to call there is any questions or concerns while she is out of town or go to urgent care/emergency room.  [name]  DPM

## 2018-03-06 ENCOUNTER — Encounter: Payer: Self-pay | Admitting: Family Medicine

## 2018-03-07 IMAGING — MR MR CERVICAL SPINE W/O CM
4 of 5 series · 27 of 48 positions shown · non-contrast
Comparison: Cervical spine radiograph 06/08/2016

CLINICAL DATA: Neck pain and bilateral arm pain

EXAM:
MRI CERVICAL SPINE WITHOUT CONTRAST
TECHNIQUE: Multiplanar, multisequence MR imaging of the cervical spine was
performed. No intravenous contrast was administered.

[Series 6: T1 · sagittal · 3.0mm · 0.66mm/px · 6 of 15 slices shown]
[im 1/15]
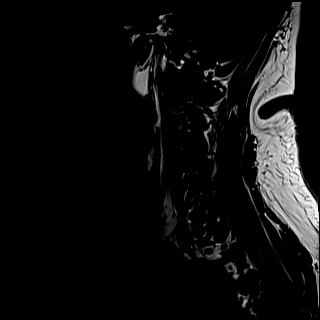
[im 3/15]
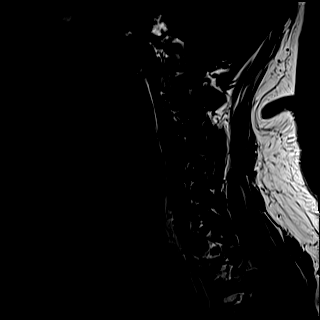
[im 6/15]
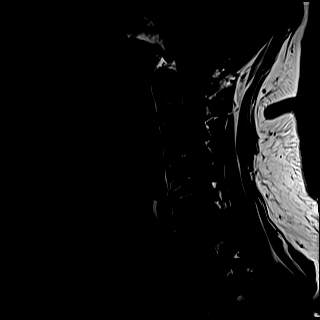
[im 9/15]
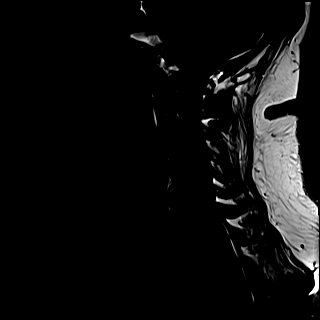
[im 12/15]
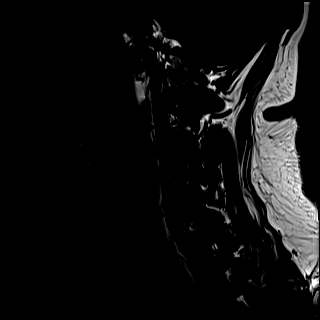
[im 15/15]
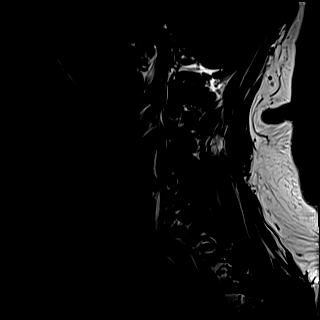

[Series 7: T2 · sagittal · 3.0mm · 0.55mm/px · 7 of 15 slices shown (1 of 2)]
[im 1/15]
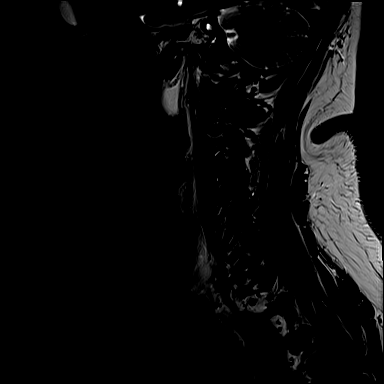
[im 3/15]
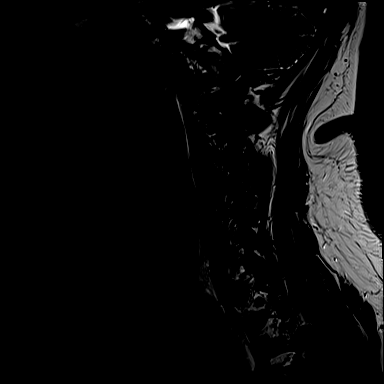
[im 5/15]
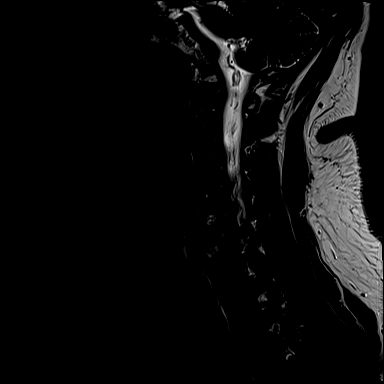
[im 8/15]
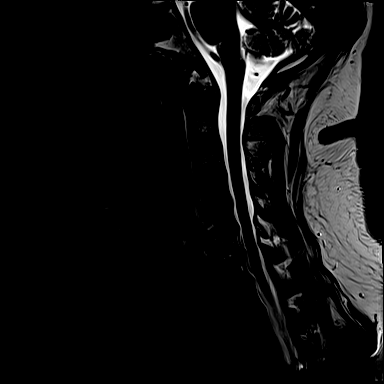
[im 10/15]
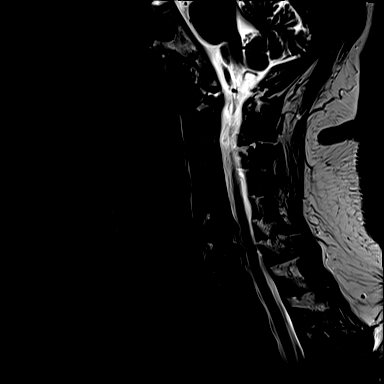
[im 12/15]
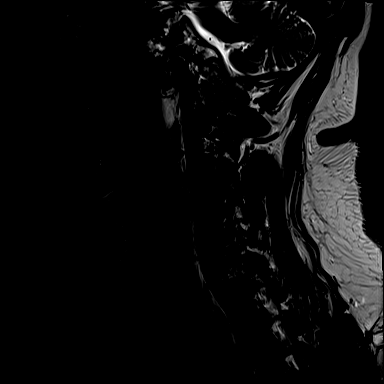
[im 15/15]
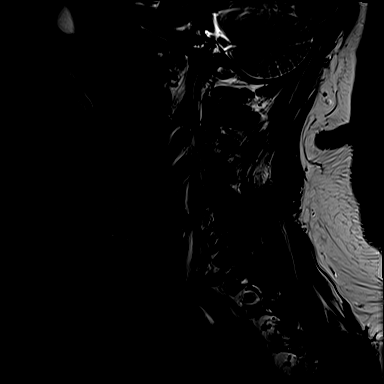

[Series 8: STIR · sagittal · 3.0mm · 0.33mm/px · 6 of 15 slices shown]
[im 1/15]
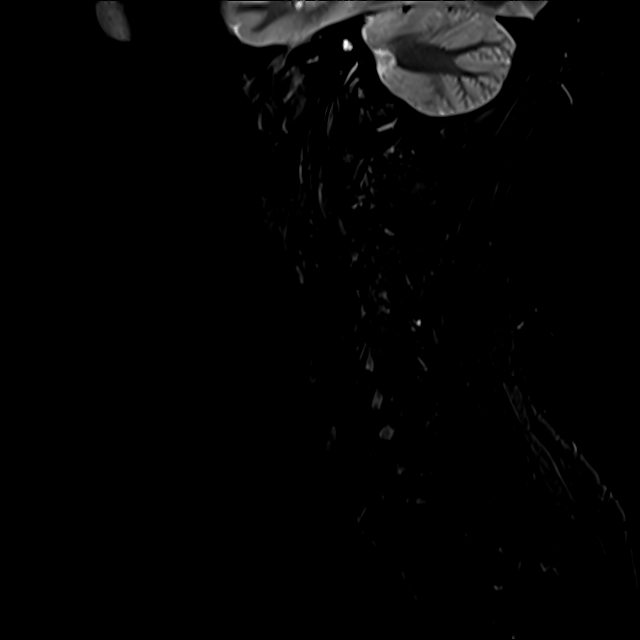
[im 3/15]
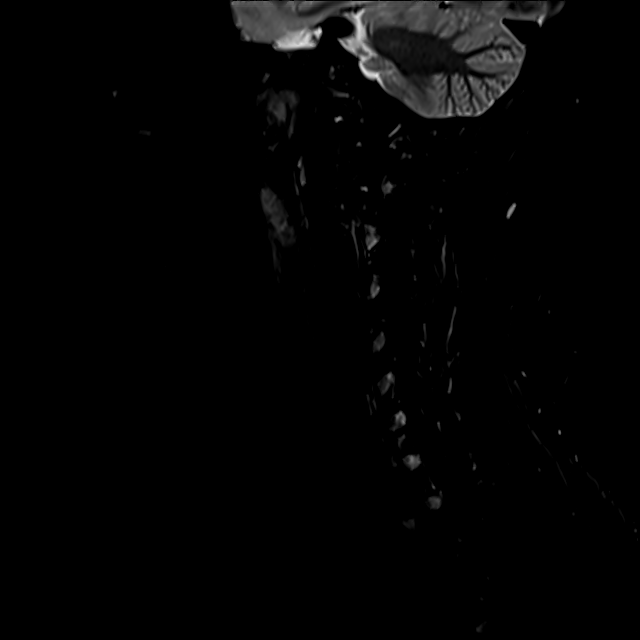
[im 5/15]
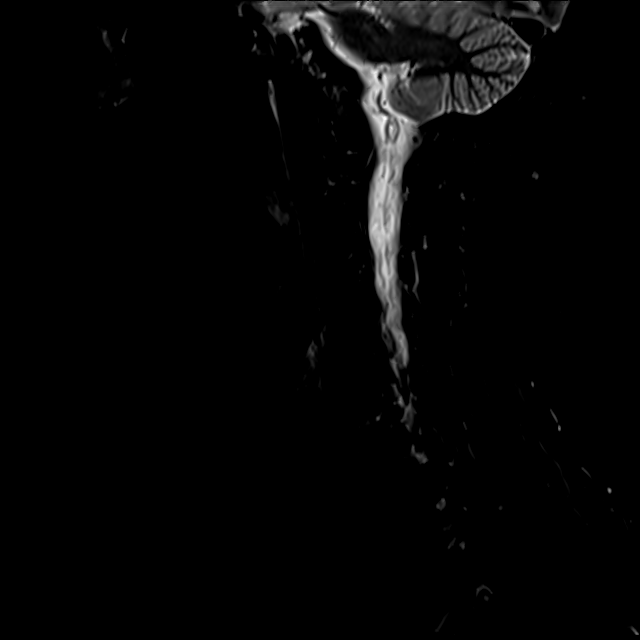
[im 8/15]
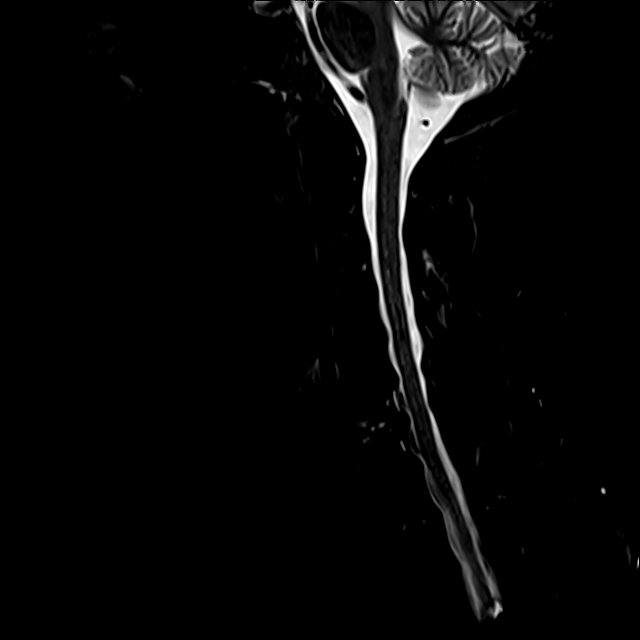
[im 10/15]
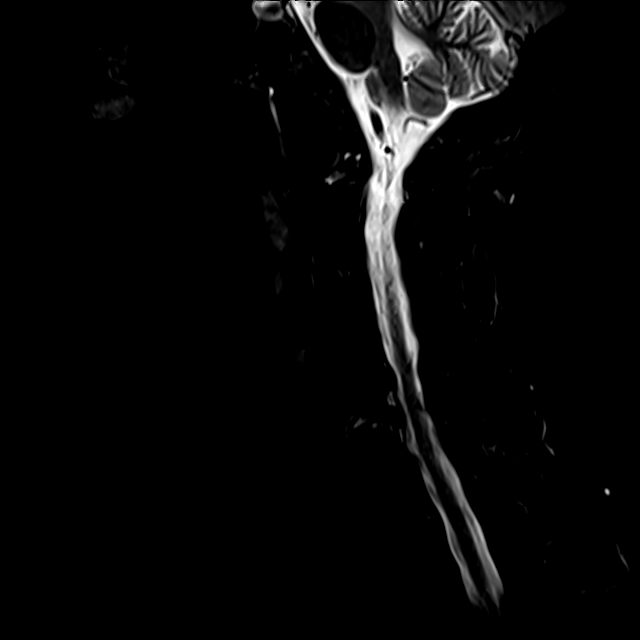
[im 12/15]
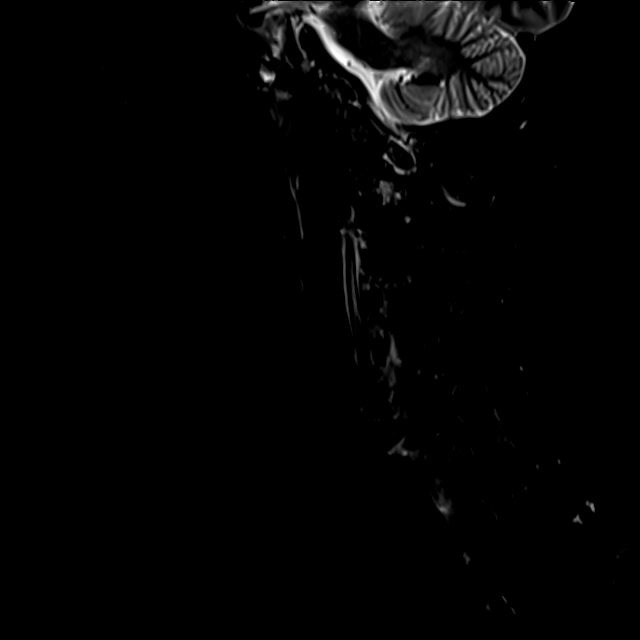

[Series 9: T2 · axial · 3.0mm · 0.50mm/px · z∈[-83,+9]mm · 8 of 30 slices shown (2 of 2)]
[im 1/30]
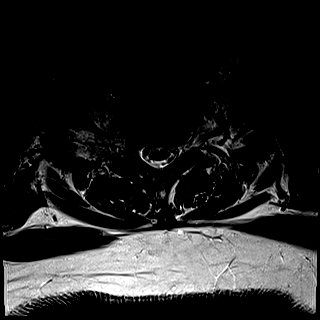
[im 5/30]
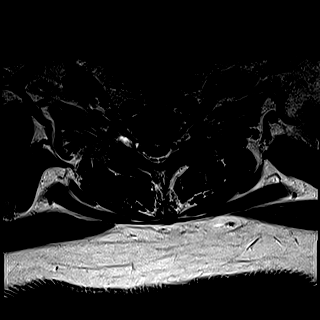
[im 9/30]
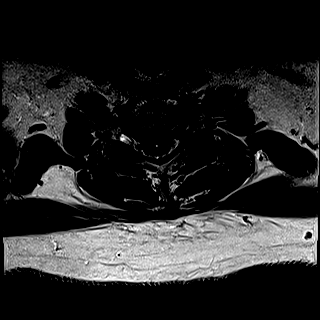
[im 14/30]
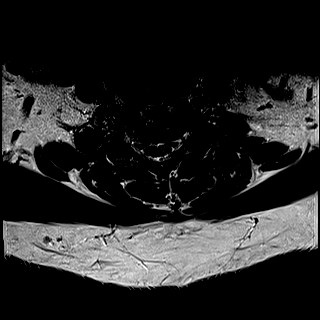
[im 16/30]
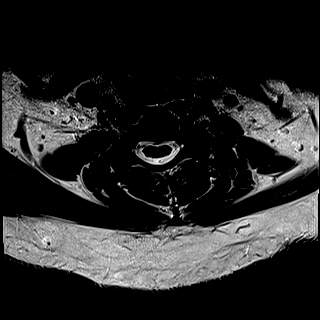
[im 21/30]
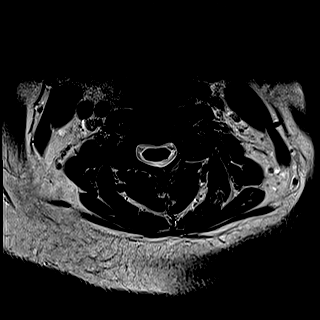
[im 25/30]
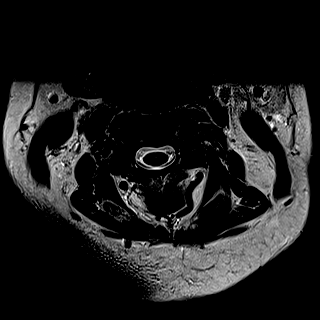
[im 30/30]
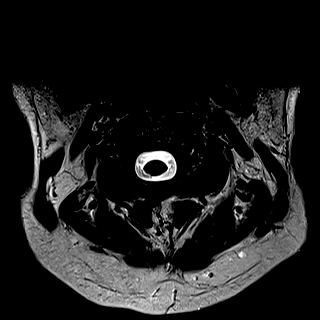

[27 of 48 positions shown; findings below may reference images not displayed]

FINDINGS: Alignment: Straightening of the normal cervical lordosis. No
listhesis.

Vertebrae: C5-C7 osteophytosis with discogenic endplate signal
changes at C5-C6. No facet edema.

Cord: Normal signal and caliber.

Posterior Fossa, vertebral arteries, paraspinal tissues:
Unremarkable

Disc levels:

C1-C2: Normal.

C2-C3: Normal disc space and facets. No spinal canal or
neuroforaminal stenosis.

C3-C4: Normal disc space and facets. No spinal canal or
neuroforaminal stenosis.

C4-C5: Small central disc protrusion narrows the ventral thecal sac.
No spinal canal stenosis or neural foraminal stenosis. Normal
facets.

C5-C6: Intermediate sized disc osteophyte complex narrows the
ventral thecal sac and mildly flattens spinal cord. Mild spinal
canal stenosis. No neural foraminal stenosis.

C6-C7: Medium-sized disc osteophyte complex effaces the ventral
thecal sac and indents the anterior spinal cord, causing mild spinal
canal stenosis. No neural foraminal stenosis.

C7-T1: Small disc protrusion without stenosis.

T1-T3: These levels are evaluated on sagittal sequences only. No
disc herniation, spinal canal stenosis or neural foraminal stenosis.
IMPRESSION: 1. Mild spinal canal stenosis at C5-6 and C6-7 secondary to
medium-sized disc osteophyte complexes.
2. No other spinal canal or neural foraminal stenosis.

## 2018-03-07 MED ORDER — TOPIRAMATE 50 MG PO TABS: 50.0000 mg | ORAL_TABLET | Freq: Two times a day (BID) | ORAL | 1 refills | Status: DC

## 2018-03-07 MED ORDER — LINACLOTIDE 290 MCG PO CAPS: ORAL_CAPSULE | ORAL | 1 refills | Status: DC

## 2018-03-28 ENCOUNTER — Other Ambulatory Visit: Payer: Self-pay | Admitting: Family Medicine

## 2018-03-28 DIAGNOSIS — J328 Other chronic sinusitis: Secondary | ICD-10-CM

## 2018-03-31 ENCOUNTER — Encounter: Payer: 59 | Admitting: Podiatry

## 2018-05-09 ENCOUNTER — Encounter (HOSPITAL_COMMUNITY): Payer: Self-pay | Admitting: Emergency Medicine

## 2018-05-09 ENCOUNTER — Other Ambulatory Visit: Payer: Self-pay

## 2018-05-09 ENCOUNTER — Emergency Department (HOSPITAL_COMMUNITY)
Admission: EM | Admit: 2018-05-09 | Discharge: 2018-05-09 | Disposition: A | Discharge status: Home / Self Care | Payer: 59 | Attending: Emergency Medicine | Admitting: Emergency Medicine

## 2018-05-09 ENCOUNTER — Emergency Department (HOSPITAL_COMMUNITY): Payer: 59

## 2018-05-09 DIAGNOSIS — W2209XA Striking against other stationary object, initial encounter: Secondary | ICD-10-CM | POA: Insufficient documentation

## 2018-05-09 DIAGNOSIS — Y999 Unspecified external cause status: Secondary | ICD-10-CM | POA: Insufficient documentation

## 2018-05-09 DIAGNOSIS — X501XXA Overexertion from prolonged static or awkward postures, initial encounter: Secondary | ICD-10-CM | POA: Insufficient documentation

## 2018-05-09 DIAGNOSIS — S92155A Nondisplaced avulsion fracture (chip fracture) of left talus, initial encounter for closed fracture: Secondary | ICD-10-CM | POA: Insufficient documentation

## 2018-05-09 DIAGNOSIS — Y929 Unspecified place or not applicable: Secondary | ICD-10-CM | POA: Diagnosis not present

## 2018-05-09 DIAGNOSIS — Y939 Activity, unspecified: Secondary | ICD-10-CM | POA: Diagnosis not present

## 2018-05-09 DIAGNOSIS — T148XXA Other injury of unspecified body region, initial encounter: Secondary | ICD-10-CM

## 2018-05-09 DIAGNOSIS — J45909 Unspecified asthma, uncomplicated: Secondary | ICD-10-CM | POA: Insufficient documentation

## 2018-05-09 DIAGNOSIS — Z79899 Other long term (current) drug therapy: Secondary | ICD-10-CM | POA: Diagnosis not present

## 2018-05-09 NOTE — ED Notes (Signed)
ED Provider at bedside. 

## 2018-05-09 NOTE — ED Notes (Signed)
Ortho paged for cam walker application

## 2018-05-09 NOTE — ED Notes (Signed)
Patient verbalizes understanding of discharge instructions. Opportunity for questioning and answers were provided. Armband removed by staff, pt discharged from ED. Pt wheeled to lobby. 

## 2018-05-09 NOTE — ED Provider Notes (Signed)
[name] [name] Provider Note   CSN: [id number] Arrival date & time: 05/09/18  1322     History   Chief Complaint Chief Complaint  Patient presents with  . Ankle Pain    HPI [name] [name] is a 53 y.o. female.  HPI   53 year old female presents today with complaints of left ankle and foot pain.  Patient notes that she was walking last night when she twisted her ankle and then struck it against a metal box.  She notes swelling and pain at the lateral malleolus and lateral proximal dorsal foot.  Patient notes pain with ambulation although she can ambulate.  She notes she is on chronic pain medication and took her medicines today with some improvement in her symptoms.  Patient denies any knee pain or any other injuries.   Past Medical History:  Diagnosis Date  . Anxiety   . Asthma    mild intermittent  . Bipolar 1 disorder ([place])    ect treatments last treatment Sep 02 1011  . Depression   . GERD (gastroesophageal reflux disease)   . Hypertension   . Pre-diabetes   . Seizures ([place])    last seizure was so long ago she can't remember.   . Sleep apnea    wears CPAP, uncertain of setting    Patient Active Problem List   Diagnosis Date Noted  . Foot ulcer ([place]) 01/31/2018  . Hyperglycemia 01/31/2018  . Left leg cellulitis 09/24/2017  . Cellulitis 09/24/2017  . Syncope and collapse   . Near syncope 11/23/2016  . Abdominal pain 09/23/2016  . Anxiety 09/23/2016  . 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[place]) 09/23/2016  . Constipation 09/23/2016  . DDD (degenerative disc disease), lumbosacral 09/23/2016  . Migraines 09/23/2016  . Myelopathy ([place]) 09/23/2016  . Nausea 09/23/2016  . Post laminectomy syndrome 09/23/2016  . Pseudoarthrosis of lumbar spine 09/23/2016  . Seizures ([place]) 09/23/2016  . Shortness of breath 09/23/2016  . Sleep apnea 09/23/2016  . Spinal stenosis of lumbar region 09/23/2016  . Vertigo 09/23/2016  . Hallux  malleus 11/15/2015  . Chronic pain syndrome   . Esophageal reflux   . Depression   . Rash 11/14/2015  . Toe ulcer, right ([place]) 04/11/2015  . Cellulitis of toe of right foot 04/11/2015  . Nonspecific chest pain   . Chest pain 10/26/2014  . Obesity 10/26/2014  . Hypokalemia 10/26/2014  . Manic bipolar I disorder ([place]) 10/26/2014  . Atypical angina ([place]) 10/26/2014  . Tardive dyskinesia 08/26/2011  . Manic bipolar I disorder with rapid cycling ([place]) 05/18/2011    Class: Acute  . Asthma 02/24/2011  . Essential hypertension 02/24/2011  . History of migraine headaches 02/24/2011    Past Surgical History:  Procedure Laterality Date  . ABDOMINAL HYSTERECTOMY    . AMPUTATION TOE Left 02/02/2018   Procedure: AMPUTATION TOE Left 4th toe;  Surgeon: [name], DPM;  Location: [facility];  Service: Podiatry;  Laterality: Left;  . BACK SURGERY    . CARPAL TUNNEL RELEASE     x2  . Laproscopic knee surgery    . NECK SURGERY  03/15/2017  . PLANTAR FASCIA RELEASE     x2     OB History    Gravida  3   Para  3   Term  3   Preterm      AB      Living  3     SAB      TAB  Ectopic      Multiple      Live Births  3            Home Medications    Prior to Admission medications   Medication Sig Start Date End Date Taking? Authorizing Provider  busPIRone (BUSPAR) 15 MG tablet Take 15 mg by mouth 3 (three) times daily.  04/26/17   [provider]  Calcium Carbonate-Vit D-Min (CALCIUM 1200 PO) Take 1 tablet by mouth daily.    [provider]  citalopram (CELEXA) 40 MG tablet Take 40 mg by mouth at bedtime.  05/28/11   Darrol Jump, MD  clotrimazole (LOTRIMIN) 1 % cream Apply 1 application topically 2 (two) times daily. 04/21/17   Shelly Bombard, MD  cyclobenzaprine (FLEXERIL) 10 MG tablet Take 10 mg by mouth 2 (two) times daily as needed for muscle spasms.     [provider]  Dextran 70-Hypromellose, PF, (ARTIFICIAL TEARS PF) 0.1-0.3 %  SOLN Place 1-2 drops into both eyes 3 (three) times daily as needed (for dry eyes).    [provider]  EMBEDA 30-1.2 MG CPCR Take 1 capsule by mouth 2 (two) times daily.  10/19/16   [provider]  estradiol (ESTRACE) 1 MG tablet Take 1 tablet (1 mg total) by mouth daily. 10/06/17   [name], MD  fluticasone (FLONASE) 50 MCG/ACT nasal spray Place 2 sprays into both nostrils daily as needed for allergies. 09/29/17   [name], MD  fluticasone (FLONASE) 50 MCG/ACT nasal spray PLACE 2 SPRAYS INTO BOTH NOSTRILS DAILY. 03/28/18   [name], MD  gabapentin (NEURONTIN) 600 MG tablet Take 1 tablet (600 mg total) by mouth 3 (three) times daily. For anxiety and pain management. May have to stop to take ECT. Patient taking differently: Take 600 mg by mouth 4 (four) times daily. For anxiety and pain management 05/28/11   Darrol Jump, MD  haloperidol (HALDOL) 5 MG tablet Take 1 tablet (5 mg total) by mouth at bedtime. For psychosis 11/15/15   Barton Dubois, MD  levocetirizine (XYZAL) 5 MG tablet TAKE ONE TABLET BY MOUTH EVERY EVENING 11/04/17   [name], MD  lidocaine (XYLOCAINE) 5 % ointment Apply 1-2 grams (1 gram = 1 inch) to the affected area 2-3 times daily for 30 days. 12/28/17   [provider]  linaclotide Rolan Lipa) 290 MCG CAPS capsule TAKE ONE CAPSULE BY MOUTH DAILY BEFORE BREAKFAST 03/07/18   [name], MD  meloxicam (MOBIC) 15 MG tablet Take 15 mg by mouth daily. 09/15/17   [provider]  metoprolol succinate (TOPROL XL) 25 MG 24 hr tablet Take 0.5 tablets (12.5 mg total) by mouth daily. 11/01/17   [name], MD  Multiple Vitamins-Minerals (MULTIVITAMIN WITH MINERALS) tablet Take 1 tablet by mouth daily.    [provider]  oxyCODONE-acetaminophen (PERCOCET) 10-325 MG tablet Take 1 tablet by mouth 3 (three) times daily. 12/14/16   [provider]  pantoprazole (PROTONIX) 40 MG tablet TAKE ONE TABLET BY  MOUTH TWICE DAILY 08/08/17   [name], MD  potassium chloride SA (KLOR-CON M20) 20 MEQ tablet Take 2 tablets (40 mEq total) by mouth daily. Patient taking differently: Take 20 mEq by mouth 2 (two) times daily.  11/02/16   [name], MD  PROAIR HFA 108 [phone] Base) MCG/ACT inhaler Inhale 2 puffs = 110mcg into the lungs every 6 (six) hours as needed for wheezing or shortness of breath. 01/10/18  [name], MD  promethazine (PHENERGAN) 25 MG tablet Take 1 tablet (25 mg total) by mouth every 8 (eight) hours as needed for nausea or vomiting. 01/24/18   [name], DPM  topiramate (TOPAMAX) 50 MG tablet Take 1 tablet (50 mg total) by mouth 2 (two) times daily. 03/07/18   [name], MD  traZODone (DESYREL) 100 MG tablet Take 100 mg by mouth at bedtime. 07/29/14   [provider]    Family History Family History  Problem Relation Age of Onset  . Diabetes Mother   . COPD Mother   . Hypertension Mother   . Hyperlipidemia Mother   . Heart disease Father   . Hyperlipidemia Father   . Hypertension Father   . Cancer Father   . Heart disease Brother   . Heart disease Daughter   . Heart disease Maternal Grandmother   . Hypertension Maternal Grandmother   . Heart disease Maternal Grandfather   . Kidney cancer Paternal Grandmother   . Heart disease Paternal Grandfather     Social History Social History   Tobacco Use  . Smoking status: Never Smoker  . Smokeless tobacco: Never Used  Substance Use Topics  . Alcohol use: No  . Drug use: No     Allergies   Penicillins; Tetracyclines & related; Tramadol; Phenazopyridine; Tetracycline; Ciprofloxacin; Codeine; and Estradiol   Review of Systems Review of Systems  All other systems reviewed and are negative.    Physical Exam Updated Vital Signs BP (!) 151/89 (BP Location: Right Arm)   Pulse 99   Temp 97.9 F (36.6 C) (Oral)   Resp 18   SpO2 99%   Physical Exam Vitals signs and nursing note  reviewed.  Constitutional:      Appearance: She is well-developed.  HENT:     Head: Normocephalic and atraumatic.  Eyes:     General: No scleral icterus.       Right eye: No discharge.        Left eye: No discharge.     Conjunctiva/sclera: Conjunctivae normal.     Pupils: Pupils are equal, round, and reactive to light.  Neck:     Musculoskeletal: Normal range of motion.     Vascular: No JVD.     Trachea: No tracheal deviation.  Pulmonary:     Effort: Pulmonary effort is normal.     Breath sounds: No stridor.  Musculoskeletal:     Comments: Bruising noted to left lateral ankle along the lateral malleolus and proximal lateral dorsal foot-no bony abnormalities no significant laxity of the ankle-proximal fibula nontender  Neurological:     Mental Status: She is alert and oriented to person, place, and time.     Coordination: Coordination normal.  Psychiatric:        Behavior: Behavior normal.        Thought Content: Thought content normal.        Judgment: Judgment normal.      ED Treatments / Results  Labs (all labs ordered are listed, but only abnormal results are displayed) Labs Reviewed - No data to display  EKG None  Radiology Dg Ankle Complete Left  Result Date: 05/09/2018 CLINICAL DATA:  Injury last night EXAM: LEFT ANKLE COMPLETE - 3+ VIEW COMPARISON:  None. FINDINGS: There is a small linear bony density lateral to the talus. The tibia and fibula are intact. Mild soft tissue swelling over the lateral ankle. Spurring at the posterior and inferior calcaneus. IMPRESSION: Possible avulsion fracture from  the talus of indeterminate age. Electronically Signed   By: Marybelle Killings M.D.   On: 05/09/2018 14:39   Dg Foot Complete Left  Result Date: 05/09/2018 CLINICAL DATA:  Acute LEFT foot pain following injury yesterday. Initial encounter. EXAM: LEFT FOOT - COMPLETE 3+ VIEW COMPARISON:  02/02/2018 and prior exams FINDINGS: No acute fracture or dislocation. Great toe  screw/fusion again noted. 4th toe amputation again identified. Soft tissues are unremarkable. IMPRESSION: No acute bony abnormality. Electronically Signed   By: Margarette Canada M.D.   On: 05/09/2018 14:39    Procedures Procedures (including critical care time)  Medications Ordered in ED Medications - No data to display   Initial Impression / Assessment and Plan / ED Course  I have reviewed the triage vital signs and the nursing notes.  Pertinent labs & imaging results that were available during my care of the patient were reviewed by me and considered in my medical decision making (see chart for details).       Assessment/Plan: 53 year old female presents today with injury to her left foot and ankle.  Question possible avulsion fracture she is tender over this area.  Patient will be placed in a boot for comfort, symptomatic care instructions given.  She is encouraged to follow-up with her primary care if symptoms persist return immediately if they worsen.  She verbalized understanding and agreement to today's plan.      Final Clinical Impressions(s) / ED Diagnoses   Final diagnoses:  Avulsion fracture    ED Discharge Orders    None       Francee Gentile 05/09/18 1453    Quintella Reichert, MD 05/09/18 [phone]

## 2018-05-09 NOTE — Progress Notes (Signed)
Orthopedic Tech Progress Note Patient Details:  [name] [name] Apr 08, 1966 [id number]  Ortho Devices Type of Ortho Device: CAM walker Ortho Device/Splint Interventions: Application   Post Interventions Patient Tolerated: Well Instructions Provided: Care of device   [place] Pink 05/09/2018, 3:01 PM

## 2018-05-09 NOTE — Discharge Instructions (Addendum)
Please read attached information. If you experience any new or worsening signs or symptoms please return to the emergency room for evaluation. Please follow-up with your primary care provider or specialist as discussed.  °

## 2018-05-15 ENCOUNTER — Encounter: Payer: Self-pay | Admitting: Family Medicine

## 2018-05-15 ENCOUNTER — Ambulatory Visit (INDEPENDENT_AMBULATORY_CARE_PROVIDER_SITE_OTHER): Payer: 59 | Admitting: Family Medicine

## 2018-05-15 VITALS — BP 136/80 | HR 80 | Temp 97.9°F | Resp 16 | Ht 63.5 in | Wt 230.0 lb

## 2018-05-15 DIAGNOSIS — S92122A Displaced fracture of body of left talus, initial encounter for closed fracture: Secondary | ICD-10-CM | POA: Diagnosis not present

## 2018-05-15 NOTE — Progress Notes (Signed)
Subjective:    Patient ID: [name] [name], female    DOB: [birth year], 53 y.o.   MRN: [id number]  HPI January 21, patient tripped over a rug at home and sprained her left ankle severely.  She went to the emergency room where an x-ray revealed an avulsion fracture of the left talus.  She is tender to palpation over the left anterior talofibular ligament.  There is minimal swelling.  There is no warmth or heat.  She is currently in a cam walker.  She is here today requesting a referral to orthopedic surgery to follow-up this fracture. Past Medical History:  Diagnosis Date  . Anxiety   . Asthma    mild intermittent  . Bipolar 1 disorder ([place])    ect treatments last treatment Sep 02 1011  . Depression   . GERD (gastroesophageal reflux disease)   . Hypertension   . Pre-diabetes   . Seizures ([place])    last seizure was so long ago she can't remember.   . Sleep apnea    wears CPAP, uncertain of setting   Past Surgical History:  Procedure Laterality Date  . ABDOMINAL HYSTERECTOMY    . AMPUTATION TOE Left 02/02/2018   Procedure: AMPUTATION TOE Left 4th toe;  Surgeon: [name], DPM;  Location: [facility];  Service: Podiatry;  Laterality: Left;  . BACK SURGERY    . CARPAL TUNNEL RELEASE     x2  . Laproscopic knee surgery    . NECK SURGERY  03/15/2017  . PLANTAR FASCIA RELEASE     x2   Current Outpatient Medications on File Prior to Visit  Medication Sig Dispense Refill  . busPIRone (BUSPAR) 15 MG tablet Take 15 mg by mouth 3 (three) times daily.   2  . Calcium Carbonate-Vit D-Min (CALCIUM 1200 PO) Take 1 tablet by mouth daily.    . citalopram (CELEXA) 40 MG tablet Take 40 mg by mouth at bedtime.     . clotrimazole (LOTRIMIN) 1 % cream Apply 1 application topically 2 (two) times daily. 113 g 2  . cyclobenzaprine (FLEXERIL) 10 MG tablet Take 10 mg by mouth 2 (two) times daily as needed for muscle spasms.     . Dextran 70-Hypromellose, PF, (ARTIFICIAL TEARS PF) 0.1-0.3 % SOLN Place  1-2 drops into both eyes 3 (three) times daily as needed (for dry eyes).    Marland Kitchen estradiol (ESTRACE) 1 MG tablet Take 1 tablet (1 mg total) by mouth daily. 90 tablet 3  . fluticasone (FLONASE) 50 MCG/ACT nasal spray PLACE 2 SPRAYS INTO BOTH NOSTRILS DAILY. 48 g 2  . gabapentin (NEURONTIN) 600 MG tablet Take 1 tablet (600 mg total) by mouth 3 (three) times daily. For anxiety and pain management. May have to stop to take ECT. (Patient taking differently: Take 600 mg by mouth 4 (four) times daily. For anxiety and pain management) 90 tablet 0  . haloperidol (HALDOL) 5 MG tablet Take 1 tablet (5 mg total) by mouth at bedtime. For psychosis    . levocetirizine (XYZAL) 5 MG tablet TAKE ONE TABLET BY MOUTH EVERY EVENING 30 tablet 5  . lidocaine (XYLOCAINE) 5 % ointment Apply 1-2 grams (1 gram = 1 inch) to the affected area 2-3 times daily for 30 days.  1  . linaclotide (LINZESS) 290 MCG CAPS capsule TAKE ONE CAPSULE BY MOUTH DAILY BEFORE BREAKFAST 90 capsule 1  . meloxicam (MOBIC) 15 MG tablet Take 15 mg by mouth daily.  2  . metoprolol succinate (TOPROL  XL) 25 MG 24 hr tablet Take 0.5 tablets (12.5 mg total) by mouth daily. 15 tablet 6  . morphine (MS CONTIN) 30 MG 12 hr tablet Take 30 mg by mouth every 12 (twelve) hours.     . Multiple Vitamins-Minerals (MULTIVITAMIN WITH MINERALS) tablet Take 1 tablet by mouth daily.    Marland Kitchen oxyCODONE-acetaminophen (PERCOCET) 10-325 MG tablet Take 1 tablet by mouth 3 (three) times daily.    . pantoprazole (PROTONIX) 40 MG tablet TAKE ONE TABLET BY MOUTH TWICE DAILY 180 tablet 2  . potassium chloride SA (KLOR-CON M20) 20 MEQ tablet Take 2 tablets (40 mEq total) by mouth daily. (Patient taking differently: Take 20 mEq by mouth 2 (two) times daily. ) 180 tablet 3  . PROAIR HFA 108 (90 Base) MCG/ACT inhaler Inhale 2 puffs = 162mcg into the lungs every 6 (six) hours as needed for wheezing or shortness of breath. 8.5 g 3  . promethazine (PHENERGAN) 25 MG tablet Take 1 tablet (25 mg  total) by mouth every 8 (eight) hours as needed for nausea or vomiting. 20 tablet 0  . topiramate (TOPAMAX) 50 MG tablet Take 1 tablet (50 mg total) by mouth 2 (two) times daily. 90 tablet 1  . traZODone (DESYREL) 100 MG tablet Take 100 mg by mouth at bedtime.  0   No current facility-administered medications on file prior to visit.    Allergies  Allergen Reactions  . Penicillins Hives and Rash    Has patient had a PCN reaction causing immediate rash, facial/tongue/throat swelling, SOB or lightheadedness with hypotension: Yes Has patient had a PCN reaction causing severe rash involving mucus membranes or skin necrosis: No Has patient had a PCN reaction that required hospitalization: No Has patient had a PCN reaction occurring within the last 10 years: No If all of the above answers are "NO", then may proceed with Cephalosporin use.  . Tetracyclines & Related Other (See Comments)    Syncope and put her "in a coma"  . Tramadol Other (See Comments)    Seizures  . Phenazopyridine Other (See Comments)    Unknown  . Tetracycline Other (See Comments)    Syncope and "Put me in a coma"  . Ciprofloxacin Rash and Itching  . Codeine Itching and Rash  . Estradiol Rash    Patches broke out the skin   Social History   Socioeconomic History  . Marital status: Legally Separated    Spouse name: Not on file  . Number of children: 3  . Years of education: 27  . Highest education level: Not on file  Occupational History  . Occupation: Disabled  Social Needs  . Financial resource strain: Not on file  . Food insecurity:    Worry: Not on file    Inability: Not on file  . Transportation needs:    Medical: Not on file    Non-medical: Not on file  Tobacco Use  . Smoking status: Never Smoker  . Smokeless tobacco: Never Used  Substance and Sexual Activity  . Alcohol use: No  . Drug use: No  . Sexual activity: Not on file  Lifestyle  . Physical activity:    Days per week: Not on file     Minutes per session: Not on file  . Stress: Not on file  Relationships  . Social connections:    Talks on phone: Not on file    Gets together: Not on file    Attends religious service: Not on file    Active  member of club or organization: Not on file    Attends meetings of clubs or organizations: Not on file    Relationship status: Not on file  . Intimate partner violence:    Fear of current or ex partner: Not on file    Emotionally abused: Not on file    Physically abused: Not on file    Forced sexual activity: Not on file  Other Topics Concern  . Not on file  Social History Narrative   Patient drink 4 16oz caffeine drinks a day     Past Medical History:  Diagnosis Date  . Anxiety   . Asthma    mild intermittent  . Bipolar 1 disorder ([place])    ect treatments last treatment Sep 02 1011  . Depression   . GERD (gastroesophageal reflux disease)   . Hypertension   . Pre-diabetes   . Seizures ([place])    last seizure was so long ago she can't remember.   . Sleep apnea    wears CPAP, uncertain of setting   Past Surgical History:  Procedure Laterality Date  . ABDOMINAL HYSTERECTOMY    . AMPUTATION TOE Left 02/02/2018   Procedure: AMPUTATION TOE Left 4th toe;  Surgeon: [name], DPM;  Location: [facility];  Service: Podiatry;  Laterality: Left;  . BACK SURGERY    . CARPAL TUNNEL RELEASE     x2  . Laproscopic knee surgery    . NECK SURGERY  03/15/2017  . PLANTAR FASCIA RELEASE     x2   Current Outpatient Medications on File Prior to Visit  Medication Sig Dispense Refill  . busPIRone (BUSPAR) 15 MG tablet Take 15 mg by mouth 3 (three) times daily.   2  . Calcium Carbonate-Vit D-Min (CALCIUM 1200 PO) Take 1 tablet by mouth daily.    . citalopram (CELEXA) 40 MG tablet Take 40 mg by mouth at bedtime.     . clotrimazole (LOTRIMIN) 1 % cream Apply 1 application topically 2 (two) times daily. 113 g 2  . cyclobenzaprine (FLEXERIL) 10 MG tablet Take 10 mg by mouth 2 (two) times  daily as needed for muscle spasms.     . Dextran 70-Hypromellose, PF, (ARTIFICIAL TEARS PF) 0.1-0.3 % SOLN Place 1-2 drops into both eyes 3 (three) times daily as needed (for dry eyes).    Marland Kitchen estradiol (ESTRACE) 1 MG tablet Take 1 tablet (1 mg total) by mouth daily. 90 tablet 3  . fluticasone (FLONASE) 50 MCG/ACT nasal spray PLACE 2 SPRAYS INTO BOTH NOSTRILS DAILY. 48 g 2  . gabapentin (NEURONTIN) 600 MG tablet Take 1 tablet (600 mg total) by mouth 3 (three) times daily. For anxiety and pain management. May have to stop to take ECT. (Patient taking differently: Take 600 mg by mouth 4 (four) times daily. For anxiety and pain management) 90 tablet 0  . haloperidol (HALDOL) 5 MG tablet Take 1 tablet (5 mg total) by mouth at bedtime. For psychosis    . levocetirizine (XYZAL) 5 MG tablet TAKE ONE TABLET BY MOUTH EVERY EVENING 30 tablet 5  . lidocaine (XYLOCAINE) 5 % ointment Apply 1-2 grams (1 gram = 1 inch) to the affected area 2-3 times daily for 30 days.  1  . linaclotide (LINZESS) 290 MCG CAPS capsule TAKE ONE CAPSULE BY MOUTH DAILY BEFORE BREAKFAST 90 capsule 1  . meloxicam (MOBIC) 15 MG tablet Take 15 mg by mouth daily.  2  . metoprolol succinate (TOPROL XL) 25 MG 24 hr tablet Take  0.5 tablets (12.5 mg total) by mouth daily. 15 tablet 6  . morphine (MS CONTIN) 30 MG 12 hr tablet Take 30 mg by mouth every 12 (twelve) hours.     . Multiple Vitamins-Minerals (MULTIVITAMIN WITH MINERALS) tablet Take 1 tablet by mouth daily.    Marland Kitchen oxyCODONE-acetaminophen (PERCOCET) 10-325 MG tablet Take 1 tablet by mouth 3 (three) times daily.    . pantoprazole (PROTONIX) 40 MG tablet TAKE ONE TABLET BY MOUTH TWICE DAILY 180 tablet 2  . potassium chloride SA (KLOR-CON M20) 20 MEQ tablet Take 2 tablets (40 mEq total) by mouth daily. (Patient taking differently: Take 20 mEq by mouth 2 (two) times daily. ) 180 tablet 3  . PROAIR HFA 108 (90 Base) MCG/ACT inhaler Inhale 2 puffs = 19mcg into the lungs every 6 (six) hours as  needed for wheezing or shortness of breath. 8.5 g 3  . promethazine (PHENERGAN) 25 MG tablet Take 1 tablet (25 mg total) by mouth every 8 (eight) hours as needed for nausea or vomiting. 20 tablet 0  . topiramate (TOPAMAX) 50 MG tablet Take 1 tablet (50 mg total) by mouth 2 (two) times daily. 90 tablet 1  . traZODone (DESYREL) 100 MG tablet Take 100 mg by mouth at bedtime.  0   No current facility-administered medications on file prior to visit.    Allergies  Allergen Reactions  . Penicillins Hives and Rash    Has patient had a PCN reaction causing immediate rash, facial/tongue/throat swelling, SOB or lightheadedness with hypotension: Yes Has patient had a PCN reaction causing severe rash involving mucus membranes or skin necrosis: No Has patient had a PCN reaction that required hospitalization: No Has patient had a PCN reaction occurring within the last 10 years: No If all of the above answers are "NO", then may proceed with Cephalosporin use.  . Tetracyclines & Related Other (See Comments)    Syncope and put her "in a coma"  . Tramadol Other (See Comments)    Seizures  . Phenazopyridine Other (See Comments)    Unknown  . Tetracycline Other (See Comments)    Syncope and "Put me in a coma"  . Ciprofloxacin Rash and Itching  . Codeine Itching and Rash  . Estradiol Rash    Patches broke out the skin   Social History   Socioeconomic History  . Marital status: Legally Separated    Spouse name: Not on file  . Number of children: 3  . Years of education: 51  . Highest education level: Not on file  Occupational History  . Occupation: Disabled  Social Needs  . Financial resource strain: Not on file  . Food insecurity:    Worry: Not on file    Inability: Not on file  . Transportation needs:    Medical: Not on file    Non-medical: Not on file  Tobacco Use  . Smoking status: Never Smoker  . Smokeless tobacco: Never Used  Substance and Sexual Activity  . Alcohol use: No  . Drug  use: No  . Sexual activity: Not on file  Lifestyle  . Physical activity:    Days per week: Not on file    Minutes per session: Not on file  . Stress: Not on file  Relationships  . Social connections:    Talks on phone: Not on file    Gets together: Not on file    Attends religious service: Not on file    Active member of club or organization: Not on  file    Attends meetings of clubs or organizations: Not on file    Relationship status: Not on file  . Intimate partner violence:    Fear of current or ex partner: Not on file    Emotionally abused: Not on file    Physically abused: Not on file    Forced sexual activity: Not on file  Other Topics Concern  . Not on file  Social History Narrative   Patient drink 4 16oz caffeine drinks a day       Review of Systems  All other systems reviewed and are negative.      Objective:   Physical Exam Vitals signs reviewed.  Constitutional:      General: She is not in acute distress.    Appearance: She is well-developed. She is not diaphoretic.  HENT:     Head: Normocephalic and atraumatic.     Mouth/Throat:     Pharynx: No oropharyngeal exudate.  Eyes:     Conjunctiva/sclera: Conjunctivae normal.     Pupils: Pupils are equal, round, and reactive to light.  Neck:     Thyroid: No thyromegaly.     Vascular: No JVD.  Cardiovascular:     Rate and Rhythm: Normal rate and regular rhythm.     Heart sounds: Normal heart sounds. No murmur. No friction rub. No gallop.   Pulmonary:     Effort: Pulmonary effort is normal. No respiratory distress.     Breath sounds: Normal breath sounds. No stridor. No wheezing or rales.  Chest:     Chest wall: No tenderness.  Abdominal:     General: Bowel sounds are normal. There is no distension.     Palpations: Abdomen is soft. There is no mass.     Tenderness: There is no abdominal tenderness. There is no guarding or rebound.     Hernia: No hernia is present.  Musculoskeletal:        General:  Deformity present.     Left ankle: She exhibits decreased range of motion. She exhibits no swelling and no ecchymosis. Tenderness. Lateral malleolus and AITFL tenderness found.  Lymphadenopathy:     Cervical: No cervical adenopathy.  Neurological:     Motor: No abnormal muscle tone.        Assessment & Plan:  Displaced fracture of body of left talus, initial encounter for closed fracture - Plan: Ambulatory referral to Orthopedic Surgery  I have recommended the patient wear the cam walker for the next 4 to 6 weeks.  I will schedule the patient to follow-up with orthopedics as requested.  Hopefully this will heal without any intervention.

## 2018-05-17 ENCOUNTER — Other Ambulatory Visit: Payer: Self-pay | Admitting: Family Medicine

## 2018-05-23 ENCOUNTER — Encounter: Payer: Self-pay | Admitting: Family Medicine

## 2018-05-23 ENCOUNTER — Other Ambulatory Visit: Payer: Self-pay | Admitting: Cardiovascular Disease

## 2018-05-23 ENCOUNTER — Ambulatory Visit (INDEPENDENT_AMBULATORY_CARE_PROVIDER_SITE_OTHER): Payer: 59 | Admitting: Family Medicine

## 2018-05-23 VITALS — BP 122/80 | HR 80 | Temp 98.0°F | Resp 16 | Ht 63.5 in | Wt 225.0 lb

## 2018-05-23 DIAGNOSIS — G43001 Migraine without aura, not intractable, with status migrainosus: Secondary | ICD-10-CM | POA: Diagnosis not present

## 2018-05-23 MED ORDER — KETOROLAC TROMETHAMINE 60 MG/2ML IM SOLN
60.0000 mg | Freq: Once | INTRAMUSCULAR | Status: AC
  Administered 2018-05-23: 60 mg via INTRAMUSCULAR

## 2018-05-23 MED ORDER — SUMATRIPTAN SUCCINATE 50 MG PO TABS: 50.0000 mg | ORAL_TABLET | ORAL | 0 refills | Status: DC | PRN

## 2018-05-23 MED ORDER — PROMETHAZINE HCL 25 MG PO TABS: 25.0000 mg | ORAL_TABLET | Freq: Three times a day (TID) | ORAL | 0 refills | Status: DC | PRN

## 2018-05-23 MED ORDER — PROMETHAZINE HCL 25 MG/ML IJ SOLN
25.0000 mg | Freq: Once | INTRAMUSCULAR | Status: AC
  Administered 2018-05-23: 25 mg via INTRAMUSCULAR

## 2018-05-23 NOTE — Progress Notes (Signed)
Subjective:    Patient ID: [name] [name], female    DOB: 1965/09/05, 53 y.o.   MRN: [id number]  HPI Patient has a history of migraines.  She is on Topamax for prevention of migraines.  This has been working well.  However Friday, the patient developed a severe headache.  It is located in the center of her forehead.  It radiates to her occiput.  It is pulsatile in nature.  It has photophobia and phonophobia.  She denies any neurologic deficit.  She denies any dizziness although she does have some nausea.  She denies any vision changes.  She has been taking Phenergan for the nausea however she has nothing to take for the headache.  In the past she has used Imitrex which is work for her but she had none left.  cranial nerves II through XII are grossly intact with muscle strength 5/5 equal and symmetric in the upper and lower extremities.  Cerebellar testing is normal Past Medical History:  Diagnosis Date  . Anxiety   . Asthma    mild intermittent  . Bipolar 1 disorder ([place])    ect treatments last treatment Sep 02 1011  . Depression   . GERD (gastroesophageal reflux disease)   . Hypertension   . Pre-diabetes   . Seizures ([place])    last seizure was so long ago she can't remember.   . Sleep apnea    wears CPAP, uncertain of setting   Past Surgical History:  Procedure Laterality Date  . ABDOMINAL HYSTERECTOMY    . AMPUTATION TOE Left 02/02/2018   Procedure: AMPUTATION TOE Left 4th toe;  Surgeon: [name], DPM;  Location: [facility];  Service: Podiatry;  Laterality: Left;  . BACK SURGERY    . CARPAL TUNNEL RELEASE     x2  . Laproscopic knee surgery    . NECK SURGERY  03/15/2017  . PLANTAR FASCIA RELEASE     x2   Current Outpatient Medications on File Prior to Visit  Medication Sig Dispense Refill  . busPIRone (BUSPAR) 15 MG tablet Take 15 mg by mouth 3 (three) times daily.   2  . Calcium Carbonate-Vit D-Min (CALCIUM 1200 PO) Take 1 tablet by mouth daily.    . citalopram  (CELEXA) 40 MG tablet Take 40 mg by mouth at bedtime.     . clotrimazole (LOTRIMIN) 1 % cream Apply 1 application topically 2 (two) times daily. 113 g 2  . cyclobenzaprine (FLEXERIL) 10 MG tablet Take 10 mg by mouth 2 (two) times daily as needed for muscle spasms.     . Dextran 70-Hypromellose, PF, (ARTIFICIAL TEARS PF) 0.1-0.3 % SOLN Place 1-2 drops into both eyes 3 (three) times daily as needed (for dry eyes).    Marland Kitchen estradiol (ESTRACE) 1 MG tablet Take 1 tablet (1 mg total) by mouth daily. 90 tablet 3  . fluticasone (FLONASE) 50 MCG/ACT nasal spray PLACE 2 SPRAYS INTO BOTH NOSTRILS DAILY. 48 g 2  . gabapentin (NEURONTIN) 600 MG tablet Take 1 tablet (600 mg total) by mouth 3 (three) times daily. For anxiety and pain management. May have to stop to take ECT. (Patient taking differently: Take 600 mg by mouth 4 (four) times daily. For anxiety and pain management) 90 tablet 0  . haloperidol (HALDOL) 5 MG tablet Take 1 tablet (5 mg total) by mouth at bedtime. For psychosis    . levocetirizine (XYZAL) 5 MG tablet TAKE ONE TABLET BY MOUTH EVERY EVENING 30 tablet 5  .  lidocaine (XYLOCAINE) 5 % ointment Apply 1-2 grams (1 gram = 1 inch) to the affected area 2-3 times daily for 30 days.  1  . linaclotide (LINZESS) 290 MCG CAPS capsule TAKE ONE CAPSULE BY MOUTH DAILY BEFORE BREAKFAST 90 capsule 1  . meloxicam (MOBIC) 15 MG tablet Take 15 mg by mouth daily.  2  . morphine (MS CONTIN) 30 MG 12 hr tablet Take 30 mg by mouth every 12 (twelve) hours.     . Multiple Vitamins-Minerals (MULTIVITAMIN WITH MINERALS) tablet Take 1 tablet by mouth daily.    Marland Kitchen oxyCODONE-acetaminophen (PERCOCET) 10-325 MG tablet Take 1 tablet by mouth 3 (three) times daily.    . pantoprazole (PROTONIX) 40 MG tablet TAKE ONE TABLET BY MOUTH TWICE DAILY 180 tablet 3  . potassium chloride SA (KLOR-CON M20) 20 MEQ tablet Take 2 tablets (40 mEq total) by mouth daily. (Patient taking differently: Take 20 mEq by mouth 2 (two) times daily. ) 180  tablet 3  . PROAIR HFA 108 (90 Base) MCG/ACT inhaler Inhale 2 puffs = 156mcg into the lungs every 6 (six) hours as needed for wheezing or shortness of breath. 8.5 g 3  . promethazine (PHENERGAN) 25 MG tablet Take 1 tablet (25 mg total) by mouth every 8 (eight) hours as needed for nausea or vomiting. 20 tablet 0  . topiramate (TOPAMAX) 50 MG tablet Take 1 tablet (50 mg total) by mouth 2 (two) times daily. 90 tablet 1  . traZODone (DESYREL) 100 MG tablet Take 100 mg by mouth at bedtime.  0   No current facility-administered medications on file prior to visit.    Allergies  Allergen Reactions  . Penicillins Hives and Rash    Has patient had a PCN reaction causing immediate rash, facial/tongue/throat swelling, SOB or lightheadedness with hypotension: Yes Has patient had a PCN reaction causing severe rash involving mucus membranes or skin necrosis: No Has patient had a PCN reaction that required hospitalization: No Has patient had a PCN reaction occurring within the last 10 years: No If all of the above answers are "NO", then may proceed with Cephalosporin use.  . Tetracyclines & Related Other (See Comments)    Syncope and put her "in a coma"  . Tramadol Other (See Comments)    Seizures  . Phenazopyridine Other (See Comments)    Unknown  . Tetracycline Other (See Comments)    Syncope and "Put me in a coma"  . Ciprofloxacin Rash and Itching  . Codeine Itching and Rash  . Estradiol Rash    Patches broke out the skin   Social History   Socioeconomic History  . Marital status: Widowed    Spouse name: Not on file  . Number of children: 3  . Years of education: 33  . Highest education level: Not on file  Occupational History  . Occupation: Disabled  Social Needs  . Financial resource strain: Not on file  . Food insecurity:    Worry: Not on file    Inability: Not on file  . Transportation needs:    Medical: Not on file    Non-medical: Not on file  Tobacco Use  . Smoking status:  Never Smoker  . Smokeless tobacco: Never Used  Substance and Sexual Activity  . Alcohol use: No  . Drug use: No  . Sexual activity: Not on file  Lifestyle  . Physical activity:    Days per week: Not on file    Minutes per session: Not on file  .  Stress: Not on file  Relationships  . Social connections:    Talks on phone: Not on file    Gets together: Not on file    Attends religious service: Not on file    Active member of club or organization: Not on file    Attends meetings of clubs or organizations: Not on file    Relationship status: Not on file  . Intimate partner violence:    Fear of current or ex partner: Not on file    Emotionally abused: Not on file    Physically abused: Not on file    Forced sexual activity: Not on file  Other Topics Concern  . Not on file  Social History Narrative   Patient drink 4 16oz caffeine drinks a day       Review of Systems  Neurological: Positive for headaches.  All other systems reviewed and are negative.      Objective:   Physical Exam Vitals signs reviewed.  Constitutional:      General: She is not in acute distress.    Appearance: She is well-developed. She is not diaphoretic.  HENT:     Head: Normocephalic and atraumatic.     Mouth/Throat:     Pharynx: No oropharyngeal exudate.  Eyes:     Conjunctiva/sclera: Conjunctivae normal.     Pupils: Pupils are equal, round, and reactive to light.  Neck:     Thyroid: No thyromegaly.     Vascular: No JVD.  Cardiovascular:     Rate and Rhythm: Normal rate and regular rhythm.     Heart sounds: Normal heart sounds. No murmur. No friction rub. No gallop.   Pulmonary:     Effort: Pulmonary effort is normal. No respiratory distress.     Breath sounds: Normal breath sounds. No stridor. No wheezing or rales.  Chest:     Chest wall: No tenderness.  Abdominal:     General: Bowel sounds are normal. There is no distension.     Palpations: Abdomen is soft. There is no mass.      Tenderness: There is no abdominal tenderness. There is no guarding or rebound.     Hernia: No hernia is present.  Lymphadenopathy:     Cervical: No cervical adenopathy.  Neurological:     Mental Status: She is alert and oriented to person, place, and time.     Cranial Nerves: No cranial nerve deficit.     Sensory: No sensory deficit.     Motor: No abnormal muscle tone.     Deep Tendon Reflexes: Reflexes normal.          Assessment & Plan:  Migraine  Toradol 60 mg IM x1 now.  25 mg of Phenergan IM x1 now.  Then use Imitrex 50 mg p.o. first sign of headache.  May repeat Imitrex one time in a 24-hour.  2 hours later if headache persist.  Continue Topamax as preventative medication.  If Imitrex is unsuccessful, consider a Medrol Dosepak

## 2018-05-23 NOTE — Addendum Note (Signed)
Addended by: Shary Decamp B on: 05/23/2018 03:43 PM   Modules accepted: Orders

## 2018-06-12 ENCOUNTER — Encounter: Payer: Self-pay | Admitting: Family Medicine

## 2018-06-12 ENCOUNTER — Other Ambulatory Visit: Payer: Self-pay | Admitting: *Deleted

## 2018-06-12 MED ORDER — TOPIRAMATE 50 MG PO TABS: 50.0000 mg | ORAL_TABLET | Freq: Two times a day (BID) | ORAL | 1 refills | Status: DC

## 2018-06-20 ENCOUNTER — Other Ambulatory Visit: Payer: Self-pay | Admitting: *Deleted

## 2018-06-20 MED ORDER — LEVOCETIRIZINE DIHYDROCHLORIDE 5 MG PO TABS: 5.0000 mg | ORAL_TABLET | Freq: Every evening | ORAL | 5 refills | Status: DC

## 2018-07-03 ENCOUNTER — Encounter: Payer: Self-pay | Admitting: Family Medicine

## 2018-07-03 ENCOUNTER — Ambulatory Visit (INDEPENDENT_AMBULATORY_CARE_PROVIDER_SITE_OTHER): Payer: 59 | Admitting: Family Medicine

## 2018-07-03 ENCOUNTER — Other Ambulatory Visit: Payer: Self-pay

## 2018-07-03 VITALS — BP 118/72 | HR 63 | Temp 98.0°F | Resp 16 | Wt 231.0 lb

## 2018-07-03 DIAGNOSIS — F419 Anxiety disorder, unspecified: Secondary | ICD-10-CM

## 2018-07-03 MED ORDER — DULOXETINE HCL 60 MG PO CPEP: 60.0000 mg | ORAL_CAPSULE | Freq: Every day | ORAL | 3 refills | Status: DC

## 2018-07-03 NOTE — Progress Notes (Signed)
Subjective:    Patient ID: [name] [name], female    DOB: [birth year], 53 y.o.   MRN: [id number]  Patient presents today complaining of worsening anxiety.  Patient is currently on Celexa and Haldol under the care of her psychiatrist.  She is also on BuSpar 15 mg p.o. 3 times daily.  She states that she has been on this medication for quite some time however she is having worsening panic attacks.  She states she is having 2-3 panic attacks every day and the BuSpar is no longer controlling her anxiety.  Apparently she has discussed this with her psychiatrist however they have been reluctant to make any changes in her medication.  However the patient today states that she cannot tolerate this any longer.  Her anxiety is out of control.  She denies any suicidal ideation.  She denies any manic symptoms. Past Medical History:  Diagnosis Date  . Anxiety   . Asthma    mild intermittent  . Bipolar 1 disorder ([place])    ect treatments last treatment Sep 02 1011  . Depression   . GERD (gastroesophageal reflux disease)   . Hypertension   . Pre-diabetes   . Seizures ([place])    last seizure was so long ago she can't remember.   . Sleep apnea    wears CPAP, uncertain of setting   Past Surgical History:  Procedure Laterality Date  . ABDOMINAL HYSTERECTOMY    . AMPUTATION TOE Left 02/02/2018   Procedure: AMPUTATION TOE Left 4th toe;  Surgeon: [name], DPM;  Location: [facility];  Service: Podiatry;  Laterality: Left;  . BACK SURGERY    . CARPAL TUNNEL RELEASE     x2  . Laproscopic knee surgery    . NECK SURGERY  03/15/2017  . PLANTAR FASCIA RELEASE     x2   Current Outpatient Medications on File Prior to Visit  Medication Sig Dispense Refill  . busPIRone (BUSPAR) 15 MG tablet Take 15 mg by mouth 3 (three) times daily.   2  . Calcium Carbonate-Vit D-Min (CALCIUM 1200 PO) Take 1 tablet by mouth daily.    . citalopram (CELEXA) 40 MG tablet Take 40 mg by mouth at bedtime.     . cyclobenzaprine  (FLEXERIL) 10 MG tablet Take 10 mg by mouth 2 (two) times daily as needed for muscle spasms.     . Dextran 70-Hypromellose, PF, (ARTIFICIAL TEARS PF) 0.1-0.3 % SOLN Place 1-2 drops into both eyes 3 (three) times daily as needed (for dry eyes).    Marland Kitchen estradiol (ESTRACE) 1 MG tablet Take 1 tablet (1 mg total) by mouth daily. 90 tablet 3  . fluticasone (FLONASE) 50 MCG/ACT nasal spray PLACE 2 SPRAYS INTO BOTH NOSTRILS DAILY. 48 g 2  . gabapentin (NEURONTIN) 600 MG tablet Take 1 tablet (600 mg total) by mouth 3 (three) times daily. For anxiety and pain management. May have to stop to take ECT. (Patient taking differently: Take 600 mg by mouth 4 (four) times daily. For anxiety and pain management) 90 tablet 0  . haloperidol (HALDOL) 5 MG tablet Take 1 tablet (5 mg total) by mouth at bedtime. For psychosis    . levocetirizine (XYZAL) 5 MG tablet Take 1 tablet (5 mg total) by mouth every evening. 30 tablet 5  . lidocaine (XYLOCAINE) 5 % ointment Apply 1-2 grams (1 gram = 1 inch) to the affected area 2-3 times daily for 30 days.  1  . linaclotide (LINZESS) 290 MCG CAPS capsule  TAKE ONE CAPSULE BY MOUTH DAILY BEFORE BREAKFAST 90 capsule 1  . meloxicam (MOBIC) 15 MG tablet Take 15 mg by mouth daily.  2  . metoprolol succinate (TOPROL-XL) 25 MG 24 hr tablet TAKE 1/2 TABLET (12.5 MG) BY MOUTH EVERY DAY 45 tablet 1  . morphine (MS CONTIN) 30 MG 12 hr tablet Take 30 mg by mouth every 12 (twelve) hours.     . Multiple Vitamins-Minerals (MULTIVITAMIN WITH MINERALS) tablet Take 1 tablet by mouth daily.    Marland Kitchen oxyCODONE-acetaminophen (PERCOCET) 10-325 MG tablet Take 1 tablet by mouth 3 (three) times daily.    . pantoprazole (PROTONIX) 40 MG tablet TAKE ONE TABLET BY MOUTH TWICE DAILY 180 tablet 3  . potassium chloride SA (KLOR-CON M20) 20 MEQ tablet Take 2 tablets (40 mEq total) by mouth daily. (Patient taking differently: Take 20 mEq by mouth 2 (two) times daily. ) 180 tablet 3  . PROAIR HFA 108 (90 Base) MCG/ACT  inhaler Inhale 2 puffs = 173mcg into the lungs every 6 (six) hours as needed for wheezing or shortness of breath. 8.5 g 3  . promethazine (PHENERGAN) 25 MG tablet Take 1 tablet (25 mg total) by mouth every 8 (eight) hours as needed for nausea or vomiting. 20 tablet 0  . SUMAtriptan (IMITREX) 50 MG tablet Take 1 tablet (50 mg total) by mouth every 2 (two) hours as needed for migraine. May repeat in 2 hours if headache persists or recurs. 10 tablet 0  . topiramate (TOPAMAX) 50 MG tablet Take 1 tablet (50 mg total) by mouth 2 (two) times daily. 90 tablet 1  . traZODone (DESYREL) 100 MG tablet Take 100 mg by mouth at bedtime.  0   No current facility-administered medications on file prior to visit.    Allergies  Allergen Reactions  . Penicillins Hives and Rash    Has patient had a PCN reaction causing immediate rash, facial/tongue/throat swelling, SOB or lightheadedness with hypotension: Yes Has patient had a PCN reaction causing severe rash involving mucus membranes or skin necrosis: No Has patient had a PCN reaction that required hospitalization: No Has patient had a PCN reaction occurring within the last 10 years: No If all of the above answers are "NO", then may proceed with Cephalosporin use.  . Tetracyclines & Related Other (See Comments)    Syncope and put her "in a coma"  . Tramadol Other (See Comments)    Seizures  . Phenazopyridine Other (See Comments)    Unknown  . Tetracycline Other (See Comments)    Syncope and "Put me in a coma"  . Ciprofloxacin Rash and Itching  . Codeine Itching and Rash  . Estradiol Rash    Patches broke out the skin   Social History   Socioeconomic History  . Marital status: Widowed    Spouse name: Not on file  . Number of children: 3  . Years of education: 70  . Highest education level: Not on file  Occupational History  . Occupation: Disabled  Social Needs  . Financial resource strain: Not on file  . Food insecurity:    Worry: Not on file     Inability: Not on file  . Transportation needs:    Medical: Not on file    Non-medical: Not on file  Tobacco Use  . Smoking status: Never Smoker  . Smokeless tobacco: Never Used  Substance and Sexual Activity  . Alcohol use: No  . Drug use: No  . Sexual activity: Not on file  Lifestyle  . Physical activity:    Days per week: Not on file    Minutes per session: Not on file  . Stress: Not on file  Relationships  . Social connections:    Talks on phone: Not on file    Gets together: Not on file    Attends religious service: Not on file    Active member of club or organization: Not on file    Attends meetings of clubs or organizations: Not on file    Relationship status: Not on file  . Intimate partner violence:    Fear of current or ex partner: Not on file    Emotionally abused: Not on file    Physically abused: Not on file    Forced sexual activity: Not on file  Other Topics Concern  . Not on file  Social History Narrative   Patient drink 4 16oz caffeine drinks a day       Review of Systems  Neurological: Positive for headaches.  All other systems reviewed and are negative.      Objective:   Physical Exam Vitals signs reviewed.  Constitutional:      General: She is not in acute distress.    Appearance: She is well-developed. She is not diaphoretic.  HENT:     Head: Normocephalic and atraumatic.     Mouth/Throat:     Pharynx: No oropharyngeal exudate.  Eyes:     Conjunctiva/sclera: Conjunctivae normal.     Pupils: Pupils are equal, round, and reactive to light.  Neck:     Thyroid: No thyromegaly.     Vascular: No JVD.  Cardiovascular:     Rate and Rhythm: Normal rate and regular rhythm.     Heart sounds: Normal heart sounds. No murmur. No friction rub. No gallop.   Pulmonary:     Effort: Pulmonary effort is normal. No respiratory distress.     Breath sounds: Normal breath sounds. No stridor. No wheezing or rales.  Chest:     Chest wall: No tenderness.   Abdominal:     General: Bowel sounds are normal. There is no distension.     Palpations: Abdomen is soft. There is no mass.     Tenderness: There is no abdominal tenderness. There is no guarding or rebound.     Hernia: No hernia is present.  Lymphadenopathy:     Cervical: No cervical adenopathy.  Neurological:     Mental Status: She is alert and oriented to person, place, and time.     Cranial Nerves: No cranial nerve deficit.     Sensory: No sensory deficit.     Motor: No abnormal muscle tone.     Deep Tendon Reflexes: Reflexes normal.          Assessment & Plan:  Anxiety  We will switch the patient Celexa from 40 mg a day to Cymbalta 60 mg a day.  I recommended that she contact her psychiatrist to make him notified that we have tried this change.  Hopefully this may also help with her pain control.  I would like to hear back from the patient in 3 to 4 weeks to see if it is helping her anxiety.  Will not make any changes in her BuSpar dose or her Haldol dose.  I recommended that the patient follow-up with her psychiatrist.  I explained to her that one person needs to be managing her anxiety but I do hope that the Cymbalta will help with her overwhelming anxiety  that no longer is being controlled by her Celexa

## 2018-07-10 ENCOUNTER — Ambulatory Visit: Payer: 59 | Admitting: Physical Therapy

## 2018-07-21 ENCOUNTER — Other Ambulatory Visit: Payer: Self-pay | Admitting: Family Medicine

## 2018-07-25 ENCOUNTER — Other Ambulatory Visit: Payer: Self-pay | Admitting: Family Medicine

## 2018-08-01 ENCOUNTER — Other Ambulatory Visit: Payer: Self-pay

## 2018-08-01 ENCOUNTER — Ambulatory Visit: Payer: 59 | Attending: Orthopedic Surgery

## 2018-08-01 DIAGNOSIS — M25572 Pain in left ankle and joints of left foot: Secondary | ICD-10-CM | POA: Insufficient documentation

## 2018-08-01 DIAGNOSIS — M25672 Stiffness of left ankle, not elsewhere classified: Secondary | ICD-10-CM | POA: Diagnosis present

## 2018-08-01 DIAGNOSIS — R262 Difficulty in walking, not elsewhere classified: Secondary | ICD-10-CM | POA: Insufficient documentation

## 2018-08-01 NOTE — Patient Instructions (Signed)
Toe Curl: Bilateral   With both feet resting on towel, slowly bunch up towel by curling toes. Repeat __10-20__ times per set. Do __1__ sets per session. Do _2___ sessions per day. ALSO WIND SHEILD WIPER FOOT IN AND OUT   10-20 REPS   ALSO PULL FOOT WITH  TOWEL FOR df STRETCH   20-30 SEC X 2-3 REPS

## 2018-08-01 NOTE — Therapy (Signed)
[place], [place], [address] Phone: [phone]   Fax:  [phone]  Physical Therapy Evaluation  Patient Details  Name: [name] [name] MRN: [id number] Date of Birth: [birth year] Referring Provider (PT): Earlie Server, MD   Encounter Date: 08/01/2018  PT End of Session - 08/01/18 1315    Visit Number  1    Number of Visits  8    Date for PT Re-Evaluation  09/22/18    Authorization Type  [employer] MCR/ Medicaid    PT Start Time  1230    PT Stop Time  1312    PT Time Calculation (min)  42 min    Activity Tolerance  Patient tolerated treatment well    Behavior During Therapy  [facility] for tasks assessed/performed       Past Medical History:  Diagnosis Date  . Anxiety   . Asthma    mild intermittent  . Bipolar 1 disorder ([place])    ect treatments last treatment Sep 02 1011  . Depression   . GERD (gastroesophageal reflux disease)   . Hypertension   . Pre-diabetes   . Seizures ([place])    last seizure was so long ago she can't remember.   . Sleep apnea    wears CPAP, uncertain of setting    Past Surgical History:  Procedure Laterality Date  . ABDOMINAL HYSTERECTOMY    . AMPUTATION TOE Left 02/02/2018   Procedure: AMPUTATION TOE Left 4th toe;  Surgeon: [name], DPM;  Location: [facility];  Service: Podiatry;  Laterality: Left;  . BACK SURGERY    . CARPAL TUNNEL RELEASE     x2  . Laproscopic knee surgery    . NECK SURGERY  03/15/2017  . PLANTAR FASCIA RELEASE     x2    There were no vitals filed for this visit.   Subjective Assessment - 08/01/18 1242    Subjective  She reports catching foot on rug and was twisted and fell.   She was placed in CAM walker initially.  Walks with gel splint in home /Boot out of home.      Limitations  Walking    How long can you walk comfortably?  shopping with support as needed in store .     Patient Stated Goals  Be able to walk normally.     Currently in Pain?   No/denies    Pain Score  --   moderate /severe with support off    Pain Location  Ankle    Pain Orientation  Left;Lateral    Pain Descriptors / Indicators  Sharp;Aching    Pain Type  Chronic pain    Pain Onset  More than a month ago    Pain Frequency  Intermittent    Aggravating Factors   weight bearing  without support to foot.     Pain Relieving Factors  off feet.          [facility] PT Assessment - 08/01/18 0001      Assessment   Medical Diagnosis  LT ankle sprain     Referring Provider (PT)  Earlie Server, MD    Onset Date/Surgical Date  05/09/18    Next MD Visit  As needed    Prior Therapy  No      Precautions   Precautions  None      Restrictions   Weight Bearing Restrictions  No      Balance Screen   Has the patient  fallen in the past 6 months  Yes    How many times?  1      Prior Function   Level of Independence  Requires assistive device for independence   boot     Cognition   Overall Cognitive Status  Within Functional Limits for tasks assessed      ROM / Strength   AROM / PROM / Strength  AROM;PROM;Strength      AROM   AROM Assessment Site  Ankle    Right/Left Ankle  Right;Left    Right Ankle Dorsiflexion  95    Right Ankle Plantar Flexion  58    Right Ankle Inversion  40    Right Ankle Eversion  20    Left Ankle Dorsiflexion  -12    Left Ankle Plantar Flexion  50    Left Ankle Inversion  38    Left Ankle Eversion  10      PROM   PROM Assessment Site  Ankle    Right/Left Ankle  Left    Left Ankle Dorsiflexion  -5    Left Ankle Plantar Flexion  58    Left Ankle Inversion  40    Left Ankle Eversion  20      Strength   Strength Assessment Site  Ankle    Right/Left Ankle  Right;Left    Right Ankle Dorsiflexion  5/5    Right Ankle Plantar Flexion  5/5    Right Ankle Inversion  5/5    Right Ankle Eversion  5/5    Left Ankle Dorsiflexion  4+/5    Left Ankle Plantar Flexion  4+/5   manual assessment   Left Ankle Inversion  4+/5                 Objective measurements completed on examination: See above findings.              PT Education - 08/01/18 1245    Education Details  POC  HEP    Person(s) Educated  Patient    Methods  Explanation;Demonstration;Tactile cues;Verbal cues;Handout    Comprehension  Returned demonstration;Verbalized understanding       PT Short Term Goals - 08/01/18 1323      PT SHORT TERM GOAL #1   Title  She will be indpendent with initial HEp     Time  2    Period  Weeks    Status  New      PT SHORT TERM GOAL #2   Title  Pt will be able to walk with  ASO with , less feeling of pain /instability.     Time  4    Period  Weeks    Status  New      PT SHORT TERM GOAL #3   Title  AROM equal to RT ankle    Time  4    Period  Weeks    Status  New        PT Long Term Goals - 08/01/18 1326      PT LONG TERM GOAL #1   Title  She will be independent with all HEP issued    Time  8    Period  Weeks    Status  New      PT LONG TERM GOAL #2   Title  She will be able to walk with no support in home and intermittant use of ASO/aircast  type brace for community access with 1-2 max pain.  Time  8    Period  Weeks    Status  New      PT LONG TERM GOAL #3   Title  She will demo normal strength with no pain on testing. to facilitate return to normal walking    Time  8    Period  Weeks    Status  New      PT LONG TERM GOAL #4   Title  She will be able to walk up and down stairs  with one rail with 1-2 max pain    Time  8    Period  Weeks    Status  New             Plan - 08/01/18 1316    Clinical Impression Statement  [name] [name] presents  post Lt ankle /sprian/ avulsion fractire with reports of pain /weakness and difficulty walking  without support of the walking boot . She should do well with PT and commitment to a HEP.   Started on NWB for strength and ROM and will progress  with resisted and weight bearing activity as able.     Examination-Activity  Limitations  Locomotion Level    Examination-Participation Restrictions  Community Activity    Stability/Clinical Decision Making  Stable/Uncomplicated    Clinical Decision Making  Low    Rehab Potential  Good    PT Frequency  1x / week    PT Duration  8 weeks    PT Treatment/Interventions  Taping;Passive range of motion;Manual techniques;Therapeutic exercise;Patient/family education;Gait training;Stair training;Cryotherapy;Iontophoresis 4mg /ml Dexamethasone;Ultrasound    PT Next Visit Plan  band exercise , manual , modalities as needed reveiew towel and stretch.  measure AROM    PT Home Exercise Plan  towel exer , DF passive stretch    Consulted and Agree with Plan of Care  Patient       Patient will benefit from skilled therapeutic intervention in order to improve the following deficits and impairments:  Pain, Difficulty walking, Decreased range of motion, Decreased strength  Visit Diagnosis: Pain in left ankle and joints of left foot  Stiffness of left ankle, not elsewhere classified  Difficulty in walking, not elsewhere classified     Problem List Patient Active Problem List   Diagnosis Date Noted  . Foot ulcer ([place]) 01/31/2018  . Hyperglycemia 01/31/2018  . Left leg cellulitis 09/24/2017  . Cellulitis 09/24/2017  . Syncope and collapse   . Near syncope 11/23/2016  . Abdominal pain 09/23/2016  . Anxiety 09/23/2016  . 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[place]) 09/23/2016  . Constipation 09/23/2016  . DDD (degenerative disc disease), lumbosacral 09/23/2016  . Migraines 09/23/2016  . Myelopathy ([place]) 09/23/2016  . Nausea 09/23/2016  . Post laminectomy syndrome 09/23/2016  . Pseudoarthrosis of lumbar spine 09/23/2016  . Seizures ([place]) 09/23/2016  . Shortness of breath 09/23/2016  . Sleep apnea 09/23/2016  . Spinal stenosis of lumbar region 09/23/2016  . Vertigo 09/23/2016  . 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 Chronic pain syndrome   . Esophageal reflux   . Depression   .  Rash 11/14/2015  . Toe ulcer, right ([place]) 04/11/2015  . Cellulitis of toe of right foot 04/11/2015  . Nonspecific chest pain   . Chest pain 10/26/2014  . Obesity 10/26/2014  . Hypokalemia 10/26/2014  . Manic bipolar I disorder ([place]) 10/26/2014  . Atypical angina ([place]) 10/26/2014  . Tardive dyskinesia 08/26/2011  . Manic bipolar I disorder with rapid cycling ([place]) 05/18/2011  Class: Acute  . Asthma 02/24/2011  . Essential hypertension 02/24/2011  . History of migraine headaches 02/24/2011    Darrel Hoover  PT 08/01/2018, 1:37 PM  [facility] [address] [place], [place], [address] Phone: [phone]   Fax:  [phone]  Name: [name] [name] MRN: [id number] Date of Birth: 06/16/1965

## 2018-08-08 ENCOUNTER — Encounter: Payer: Self-pay | Admitting: Physical Therapy

## 2018-08-08 ENCOUNTER — Ambulatory Visit: Payer: 59 | Admitting: Physical Therapy

## 2018-08-08 ENCOUNTER — Other Ambulatory Visit: Payer: Self-pay

## 2018-08-08 DIAGNOSIS — R262 Difficulty in walking, not elsewhere classified: Secondary | ICD-10-CM

## 2018-08-08 DIAGNOSIS — M25572 Pain in left ankle and joints of left foot: Secondary | ICD-10-CM | POA: Diagnosis not present

## 2018-08-08 DIAGNOSIS — M25672 Stiffness of left ankle, not elsewhere classified: Secondary | ICD-10-CM

## 2018-08-08 NOTE — Patient Instructions (Signed)
Access Code: [email or web address]  URL: [email or web address]  Date: 08/08/2018  Prepared by: Carolyne Littles   Program Notes  Don't put your self into much pain. If it does get painful: RICE rest, ice , compression, elevation   Exercises  Ankle Dorsiflexion with Resistance - 10 reps - 2 sets - 2x daily - 7x weekly  Ankle Plantar Flexion with Resistance - 10 reps - 2 sets - 2x daily - 7x weekly  Long Sitting Calf Stretch with Strap - 3 reps - 3 sets - 20 sec hold - 2x daily - 7x weekly

## 2018-08-09 NOTE — Therapy (Signed)
[place], [place], [address] Phone: [phone]   Fax:  [phone]  Physical Therapy Treatment  Patient Details  Name: [name] [name] MRN: [id number] Date of Birth: November 24, 1965 Referring Provider (PT): Earlie Server, MD   Encounter Date: 08/08/2018  PT End of Session - 08/09/18 1358    Visit Number  2    Number of Visits  8    Date for PT Re-Evaluation  09/22/18    Authorization Type  UHC MCR/ Medicaid    PT Start Time  1400    PT Stop Time  1441    PT Time Calculation (min)  41 min    Activity Tolerance  Patient tolerated treatment well    Behavior During Therapy  [facility] for tasks assessed/performed       Past Medical History:  Diagnosis Date  . Anxiety   . Asthma    mild intermittent  . Bipolar 1 disorder ([place])    ect treatments last treatment Sep 02 1011  . Depression   . GERD (gastroesophageal reflux disease)   . Hypertension   . Pre-diabetes   . Seizures ([place])    last seizure was so long ago she can't remember.   . Sleep apnea    wears CPAP, uncertain of setting    Past Surgical History:  Procedure Laterality Date  . ABDOMINAL HYSTERECTOMY    . AMPUTATION TOE Left 02/02/2018   Procedure: AMPUTATION TOE Left 4th toe;  Surgeon: [name], DPM;  Location: [place];  Service: Podiatry;  Laterality: Left;  . BACK SURGERY    . CARPAL TUNNEL RELEASE     x2  . Laproscopic knee surgery    . NECK SURGERY  03/15/2017  . PLANTAR FASCIA RELEASE     x2    There were no vitals filed for this visit.  Subjective Assessment - 08/08/18 1325    Subjective  Patient reports a little soreness today. She has been worlking on her exercises. She is using her air-cast at home. She is using her boot when she is out and about.     Limitations  Walking    How long can you walk comfortably?  shopping with support as needed in store .     Patient Stated Goals  Be able to walk normally.     Currently  in Pain?  No/denies                       [facility] Adult PT Treatment/Exercise - 08/09/18 0001      Exercises   Exercises  Ankle      Manual Therapy   Manual Therapy  Joint mobilization      Ankle Exercises: Stretches   Other Stretch  gastroc stretch 3x20 sec hold       Ankle Exercises: Seated   Other Seated Ankle Exercises  hip abduction 2x10; attmpeted LAQ but patient has baseline knee injuries    Other Seated Ankle Exercises  heel raise 2x10; windsheild wiper 2x10;       Ankle Exercises: Supine   Other Supine Ankle Exercises  ankle DF 2x10 red; PF 2x10 red mod cuing for proper technique and to stay pain free     Other Supine Ankle Exercises  yellow band IV 2x10 yellow band IV 2x10 cuing to keep pain free             PT Education - 08/09/18 1358    Education  Details  reviewed HEP and symptom mangement     Person(s) Educated  Patient    Methods  Explanation;Demonstration;Tactile cues;Verbal cues    Comprehension  Verbalized understanding;Returned demonstration;Verbal cues required;Tactile cues required       PT Short Term Goals - 08/01/18 1323      PT SHORT TERM GOAL #1   Title  She will be indpendent with initial HEp     Time  2    Period  Weeks    Status  New      PT SHORT TERM GOAL #2   Title  Pt will be able to walk with  ASO with , less feeling of pain /instability.     Time  4    Period  Weeks    Status  New      PT SHORT TERM GOAL #3   Title  AROM equal to RT ankle    Time  4    Period  Weeks    Status  New        PT Long Term Goals - 08/01/18 1326      PT LONG TERM GOAL #1   Title  She will be independent with all HEP issued    Time  8    Period  Weeks    Status  New      PT LONG TERM GOAL #2   Title  She will be able to walk with no support in home and intermittant use of ASO/aircast  type brace for community access with 1-2 max pain.     Time  8    Period  Weeks    Status  New      PT LONG TERM GOAL #3   Title  She  will demo normal strength with no pain on testing. to facilitate return to normal walking    Time  8    Period  Weeks    Status  New      PT LONG TERM GOAL #4   Title  She will be able to walk up and down stairs  with one rail with 1-2 max pain    Time  8    Period  Weeks    Status  New            Plan - 08/08/18 1524    Clinical Impression Statement  Patient had some pain when moving past neutral with PROM. She was shown how to stretch herself at home and was encouraged to do so a few times a day. She was encouraged to perfrom RICE if her symptoms increased today. Overall she is making good progress.     Examination-Activity Limitations  Locomotion Level    Examination-Participation Restrictions  Community Activity    Stability/Clinical Decision Making  Stable/Uncomplicated    Clinical Decision Making  Low    Rehab Potential  Good    PT Frequency  1x / week    PT Duration  8 weeks    PT Treatment/Interventions  Taping;Passive range of motion;Manual techniques;Therapeutic exercise;Patient/family education;Gait training;Stair training;Cryotherapy;Iontophoresis 4mg /ml Dexamethasone;Ultrasound    PT Next Visit Plan  band exercise , manual , modalities as needed reveiew towel and stretch.  measure AROM; consider adding forward weight shifting; consider adding march next biist, add nu-step    PT Home Exercise Plan  towel exer , DF passive stretch    Consulted and Agree with Plan of Care  Patient       Patient will benefit from  skilled therapeutic intervention in order to improve the following deficits and impairments:  Pain, Difficulty walking, Decreased range of motion, Decreased strength  Visit Diagnosis: Pain in left ankle and joints of left foot  Stiffness of left ankle, not elsewhere classified  Difficulty in walking, not elsewhere classified     Problem List Patient Active Problem List   Diagnosis Date Noted  . Foot ulcer ([place]) 01/31/2018  . Hyperglycemia 01/31/2018   . Left leg cellulitis 09/24/2017  . Cellulitis 09/24/2017  . Syncope and collapse   . Near syncope 11/23/2016  . Abdominal pain 09/23/2016  . Anxiety 09/23/2016  . 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[place]) 09/23/2016  . Constipation 09/23/2016  . DDD (degenerative disc disease), lumbosacral 09/23/2016  . Migraines 09/23/2016  . Myelopathy ([place]) 09/23/2016  . Nausea 09/23/2016  . Post laminectomy syndrome 09/23/2016  . Pseudoarthrosis of lumbar spine 09/23/2016  . Seizures ([place]) 09/23/2016  . Shortness of breath 09/23/2016  . Sleep apnea 09/23/2016  . Spinal stenosis of lumbar region 09/23/2016  . Vertigo 09/23/2016  . 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 Chronic pain syndrome   . Esophageal reflux   . Depression   . Rash 11/14/2015  . Toe ulcer, right ([place]) 04/11/2015  . Cellulitis of toe of right foot 04/11/2015  . Nonspecific chest pain   . Chest pain 10/26/2014  . Obesity 10/26/2014  . Hypokalemia 10/26/2014  . Manic bipolar I disorder ([place]) 10/26/2014  . Atypical angina ([place]) 10/26/2014  . Tardive dyskinesia 08/26/2011  . Manic bipolar I disorder with rapid cycling ([place]) 05/18/2011    Class: Acute  . Asthma 02/24/2011  . Essential hypertension 02/24/2011  . History of migraine headaches 02/24/2011    Carney Living PT DPT  08/09/2018, 2:11 PM  [facility] [address] [place], [place], [address] Phone: [phone]   Fax:  [phone]  Name: ADONIS RYTHER MRN: [id number] Date of Birth: April 12, 1966

## 2018-08-14 ENCOUNTER — Other Ambulatory Visit: Payer: Self-pay | Admitting: Family Medicine

## 2018-08-15 ENCOUNTER — Encounter: Payer: Self-pay | Admitting: Physical Therapy

## 2018-08-15 ENCOUNTER — Other Ambulatory Visit: Payer: Self-pay

## 2018-08-15 ENCOUNTER — Ambulatory Visit: Payer: 59 | Admitting: Physical Therapy

## 2018-08-15 DIAGNOSIS — M25572 Pain in left ankle and joints of left foot: Secondary | ICD-10-CM | POA: Diagnosis not present

## 2018-08-15 DIAGNOSIS — R262 Difficulty in walking, not elsewhere classified: Secondary | ICD-10-CM

## 2018-08-15 DIAGNOSIS — M25672 Stiffness of left ankle, not elsewhere classified: Secondary | ICD-10-CM

## 2018-08-16 NOTE — Therapy (Signed)
[place], [place], [address] Phone: [phone]   Fax:  [phone]  Physical Therapy Treatment  Patient Details  Name: [name] [name] MRN: [id number] Date of Birth: 1965-05-15 Referring Provider (PT): Earlie Server, MD   Encounter Date: 08/15/2018  PT End of Session - 08/16/18 1013    Visit Number  3    Number of Visits  8    Date for PT Re-Evaluation  09/22/18    Authorization Type  UHC MCR/ Medicaid    PT Start Time  1400    PT Stop Time  1451    PT Time Calculation (min)  51 min    Activity Tolerance  Patient tolerated treatment well    Behavior During Therapy  [facility] for tasks assessed/performed       Past Medical History:  Diagnosis Date  . Anxiety   . Asthma    mild intermittent  . Bipolar 1 disorder ([place])    ect treatments last treatment Sep 02 1011  . Depression   . GERD (gastroesophageal reflux disease)   . Hypertension   . Pre-diabetes   . Seizures ([place])    last seizure was so long ago she can't remember.   . Sleep apnea    wears CPAP, uncertain of setting    Past Surgical History:  Procedure Laterality Date  . ABDOMINAL HYSTERECTOMY    . AMPUTATION TOE Left 02/02/2018   Procedure: AMPUTATION TOE Left 4th toe;  Surgeon: [name], DPM;  Location: [place];  Service: Podiatry;  Laterality: Left;  . BACK SURGERY    . CARPAL TUNNEL RELEASE     x2  . Laproscopic knee surgery    . NECK SURGERY  03/15/2017  . PLANTAR FASCIA RELEASE     x2    There were no vitals filed for this visit.  Subjective Assessment - 08/15/18 1402    Subjective  Patient reports she was a little sore after the intial visit. She reports it is not too bad today. She cones in today wearing the air-cast.     Limitations  Walking    How long can you walk comfortably?  shopping with support as needed in store .     Patient Stated Goals  Be able to walk normally.     Currently in Pain?  Yes    Pain  Score  5     Pain Location  Ankle    Pain Orientation  Left;Lateral    Pain Descriptors / Indicators  Aching;Sharp    Pain Type  Chronic pain    Pain Onset  More than a month ago    Pain Frequency  Intermittent    Aggravating Factors   putting weight on the left foot                        [facility] Adult PT Treatment/Exercise - 08/16/18 0001      Modalities   Modalities  Cryotherapy      Cryotherapy   Number Minutes Cryotherapy  10 Minutes    Cryotherapy Location  Ankle    Type of Cryotherapy  Ice pack      Manual Therapy   Manual Therapy  Passive ROM;Joint mobilization    Joint Mobilization  Anterior drawar glide; sub talor glide     Passive ROM  PROM into DF       Ankle Exercises: Stretches   Other Stretch  gastroc stretch  3x20 sec hold min cuing for tehcniuqe       Ankle Exercises: Standing   Other Standing Ankle Exercises  standing slow march 2x10; with cuing not to toe out; standing weight shift forwar 2x10 given for home       Ankle Exercises: Supine   Other Supine Ankle Exercises  ankle DF 2x10 red; PF 2x10 red mod cuing for proper technique and to stay pain free     Other Supine Ankle Exercises  yellow band IV 2x10 yellow band IV 2x10 cuing to keep pain free      Ankle Exercises: Seated   Other Seated Ankle Exercises  hip abduction 2x10; attmpeted LAQ but patient has baseline knee injuries             PT Education - 08/16/18 1013    Education Details  HEp and symptom management     Person(s) Educated  Patient    Methods  Explanation;Demonstration;Tactile cues;Verbal cues    Comprehension  Verbalized understanding;Returned demonstration;Verbal cues required;Tactile cues required       PT Short Term Goals - 08/16/18 1017      PT SHORT TERM GOAL #1   Title  She will be indpendent with initial HEp     Time  2    Period  Weeks    Status  On-going      PT SHORT TERM GOAL #2   Title  Pt will be able to walk with  ASO with , less feeling of  pain /instability.     Baseline  feels like it may bend backwards     Time  4    Period  Weeks    Status  On-going      PT SHORT TERM GOAL #3   Title  AROM equal to RT ankle    Time  4    Period  Weeks    Status  On-going        PT Long Term Goals - 08/01/18 1326      PT LONG TERM GOAL #1   Title  She will be independent with all HEP issued    Time  8    Period  Weeks    Status  New      PT LONG TERM GOAL #2   Title  She will be able to walk with no support in home and intermittant use of ASO/aircast  type brace for community access with 1-2 max pain.     Time  8    Period  Weeks    Status  New      PT LONG TERM GOAL #3   Title  She will demo normal strength with no pain on testing. to facilitate return to normal walking    Time  8    Period  Weeks    Status  New      PT LONG TERM GOAL #4   Title  She will be able to walk up and down stairs  with one rail with 1-2 max pain    Time  8    Period  Weeks    Status  New            Plan - 08/16/18 1016    Clinical Impression Statement  Patient continues to have pain around her talus with DF stretching. She is able to activtly DF to neutral. She was encouraged to continue strethcing at home. She was given light standing exercises.     Examination-Activity  Limitations  Locomotion Level    Examination-Participation Restrictions  Community Activity    Stability/Clinical Decision Making  Stable/Uncomplicated    Clinical Decision Making  Low    Rehab Potential  Good    PT Frequency  1x / week    PT Duration  8 weeks    PT Treatment/Interventions  Taping;Passive range of motion;Manual techniques;Therapeutic exercise;Patient/family education;Gait training;Stair training;Cryotherapy;Iontophoresis 4mg /ml Dexamethasone;Ultrasound    PT Next Visit Plan  band exercise , manual , modalities as needed reveiew towel and stretch.  measure AROM; consider adding forward weight shifting; consider adding march next biist, add nu-step     PT Home Exercise Plan  towel exer , DF passive stretch    Consulted and Agree with Plan of Care  Patient       Patient will benefit from skilled therapeutic intervention in order to improve the following deficits and impairments:  Pain, Difficulty walking, Decreased range of motion, Decreased strength  Visit Diagnosis: Pain in left ankle and joints of left foot  Stiffness of left ankle, not elsewhere classified  Difficulty in walking, not elsewhere classified     Problem List Patient Active Problem List   Diagnosis Date Noted  . Foot ulcer ([place]) 01/31/2018  . Hyperglycemia 01/31/2018  . Left leg cellulitis 09/24/2017  . Cellulitis 09/24/2017  . Syncope and collapse   . Near syncope 11/23/2016  . Abdominal pain 09/23/2016  . Anxiety 09/23/2016  . 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[place]) 09/23/2016  . Constipation 09/23/2016  . DDD (degenerative disc disease), lumbosacral 09/23/2016  . Migraines 09/23/2016  . Myelopathy ([place]) 09/23/2016  . Nausea 09/23/2016  . Post laminectomy syndrome 09/23/2016  . Pseudoarthrosis of lumbar spine 09/23/2016  . Seizures ([place]) 09/23/2016  . Shortness of breath 09/23/2016  . Sleep apnea 09/23/2016  . Spinal stenosis of lumbar region 09/23/2016  . Vertigo 09/23/2016  . 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 Chronic pain syndrome   . Esophageal reflux   . Depression   . Rash 11/14/2015  . Toe ulcer, right ([place]) 04/11/2015  . Cellulitis of toe of right foot 04/11/2015  . Nonspecific chest pain   . Chest pain 10/26/2014  . Obesity 10/26/2014  . Hypokalemia 10/26/2014  . Manic bipolar I disorder ([place]) 10/26/2014  . Atypical angina ([place]) 10/26/2014  . Tardive dyskinesia 08/26/2011  . Manic bipolar I disorder with rapid cycling ([place]) 05/18/2011    Class: Acute  . Asthma 02/24/2011  . Essential hypertension 02/24/2011  . History of migraine headaches 02/24/2011    Carney Living  PT DPT  08/16/2018, 10:18 AM  [facility] [address] [place], [place], [address] Phone: [phone]   Fax:  [phone]  Name: [name] [name] MRN: [id number] Date of Birth: 09/06/1965

## 2018-08-22 ENCOUNTER — Other Ambulatory Visit: Payer: Self-pay

## 2018-08-22 ENCOUNTER — Ambulatory Visit: Payer: 59 | Attending: Orthopedic Surgery | Admitting: Physical Therapy

## 2018-08-22 ENCOUNTER — Encounter: Payer: Self-pay | Admitting: Physical Therapy

## 2018-08-22 DIAGNOSIS — M25572 Pain in left ankle and joints of left foot: Secondary | ICD-10-CM | POA: Insufficient documentation

## 2018-08-22 DIAGNOSIS — R262 Difficulty in walking, not elsewhere classified: Secondary | ICD-10-CM | POA: Insufficient documentation

## 2018-08-22 DIAGNOSIS — M25672 Stiffness of left ankle, not elsewhere classified: Secondary | ICD-10-CM | POA: Diagnosis present

## 2018-08-23 ENCOUNTER — Encounter: Payer: Self-pay | Admitting: Physical Therapy

## 2018-08-23 NOTE — Therapy (Signed)
[place], [place], [address] Phone: [phone]   Fax:  [phone]  Physical Therapy Treatment  Patient Details  Name: [name] [name] MRN: [id number] Date of Birth: [birth year] Referring Provider (PT): Earlie Server, MD   Encounter Date: 08/22/2018  PT End of Session - 08/22/18 1410    Visit Number  4    Number of Visits  8    Date for PT Re-Evaluation  09/22/18    Authorization Type  UHC MCR/ Medicaid    PT Start Time  1403    PT Stop Time  1445    PT Time Calculation (min)  42 min    Activity Tolerance  Patient tolerated treatment well    Behavior During Therapy  [facility] for tasks assessed/performed       Past Medical History:  Diagnosis Date  . Anxiety   . Asthma    mild intermittent  . Bipolar 1 disorder ([place])    ect treatments last treatment Sep 02 1011  . Depression   . GERD (gastroesophageal reflux disease)   . Hypertension   . Pre-diabetes   . Seizures ([place])    last seizure was so long ago she can't remember.   . Sleep apnea    wears CPAP, uncertain of setting    Past Surgical History:  Procedure Laterality Date  . ABDOMINAL HYSTERECTOMY    . AMPUTATION TOE Left 02/02/2018   Procedure: AMPUTATION TOE Left 4th toe;  Surgeon: [name], DPM;  Location: [place];  Service: Podiatry;  Laterality: Left;  . BACK SURGERY    . CARPAL TUNNEL RELEASE     x2  . Laproscopic knee surgery    . NECK SURGERY  03/15/2017  . PLANTAR FASCIA RELEASE     x2    There were no vitals filed for this visit.  Subjective Assessment - 08/22/18 1406    Subjective  Patient has no complaints today. She reports the ankle has been feeling good. She still has an achy feeling at [employer] the top of the foot. She cones in today with less of an antalgic gait.     Limitations  Walking    How long can you walk comfortably?  shopping with support as needed in store .     Patient Stated Goals  Be able to  walk normally.     Currently in Pain?  No/denies                       [facility] Adult PT Treatment/Exercise - 08/23/18 0001      Manual Therapy   Manual Therapy  Passive ROM;Joint mobilization    Joint Mobilization  Anterior drawar glide; sub talor glide     Passive ROM  PROM into DF       Ankle Exercises: Standing   Other Standing Ankle Exercises  heel raise 2x10 in pain free range; Step onto air-ex x10 forward and x10 side to side; 4 inch step x10 forward and x10 lateral;     Other Standing Ankle Exercises  step up forward and lateral x10 4 inch; step onto air-ex forward and lateral 2x10 each with single handhold; Slow march       Ankle Exercises: Supine   Other Supine Ankle Exercises  ankle DF 2x10 red; PF 2x10 red mod cuing for proper technique and to stay pain free       Ankle Exercises: Stretches   Other Stretch  gastroc stretch 3x20 sec hold min cuing for tehcniuqe              PT Education - 08/22/18 1407    Education Details  reviewed symptom mangement and the oimprotance of stretching     Person(s) Educated  Patient    Methods  Explanation;Demonstration;Tactile cues;Verbal cues    Comprehension  Verbalized understanding;Returned demonstration;Verbal cues required;Tactile cues required       PT Short Term Goals - 08/23/18 1006      PT SHORT TERM GOAL #1   Title  She will be indpendent with initial HEp     Time  2    Period  Weeks    Status  On-going      PT SHORT TERM GOAL #2   Title  Pt will be able to walk with  ASO with , less feeling of pain /instability.     Baseline  significant improvement     Time  4    Period  Weeks    Status  On-going      PT SHORT TERM GOAL #3   Title  AROM equal to RT ankle    Time  4    Period  Weeks    Status  On-going        PT Long Term Goals - 08/01/18 1326      PT LONG TERM GOAL #1   Title  She will be independent with all HEP issued    Time  8    Period  Weeks    Status  New      PT LONG  TERM GOAL #2   Title  She will be able to walk with no support in home and intermittant use of ASO/aircast  type brace for community access with 1-2 max pain.     Time  8    Period  Weeks    Status  New      PT LONG TERM GOAL #3   Title  She will demo normal strength with no pain on testing. to facilitate return to normal walking    Time  8    Period  Weeks    Status  New      PT LONG TERM GOAL #4   Title  She will be able to walk up and down stairs  with one rail with 1-2 max pain    Time  8    Period  Weeks    Status  New            Plan - 08/23/18 0935    Clinical Impression Statement  Therapy added stair training and instability training. She had no increase in pain. She had less pain with PROM today. Per visual inspection her DF is passed neutral without as much pain as last time. She was also advanced from weight shifts to heel raises without much pain. She was fiven a green band for home. Therapy will continue to progress as tolerated.     Examination-Activity Limitations  Locomotion Level    Examination-Participation Restrictions  Community Activity    PT Treatment/Interventions  Taping;Passive range of motion;Manual techniques;Therapeutic exercise;Patient/family education;Gait training;Stair training;Cryotherapy;Iontophoresis 4mg /ml Dexamethasone;Ultrasound    PT Next Visit Plan  band exercise , manual , modalities as needed reveiew towel and stretch.  measure AROM; consider adding forward weight shifting; consider adding march next biist, add nu-step    PT Home Exercise Plan  towel exer , DF passive stretch    Consulted  and Agree with Plan of Care  Patient       Patient will benefit from skilled therapeutic intervention in order to improve the following deficits and impairments:  Pain, Difficulty walking, Decreased range of motion, Decreased strength  Visit Diagnosis: Pain in left ankle and joints of left foot  Stiffness of left ankle, not elsewhere  classified  Difficulty in walking, not elsewhere classified     Problem List Patient Active Problem List   Diagnosis Date Noted  . Foot ulcer ([place]) 01/31/2018  . Hyperglycemia 01/31/2018  . Left leg cellulitis 09/24/2017  . Cellulitis 09/24/2017  . Syncope and collapse   . Near syncope 11/23/2016  . Abdominal pain 09/23/2016  . Anxiety 09/23/2016  . 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[place]) 09/23/2016  . Constipation 09/23/2016  . DDD (degenerative disc disease), lumbosacral 09/23/2016  . Migraines 09/23/2016  . Myelopathy ([place]) 09/23/2016  . Nausea 09/23/2016  . Post laminectomy syndrome 09/23/2016  . Pseudoarthrosis of lumbar spine 09/23/2016  . Seizures ([place]) 09/23/2016  . Shortness of breath 09/23/2016  . Sleep apnea 09/23/2016  . Spinal stenosis of lumbar region 09/23/2016  . Vertigo 09/23/2016  . 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 Chronic pain syndrome   . Esophageal reflux   . Depression   . Rash 11/14/2015  . Toe ulcer, right ([place]) 04/11/2015  . Cellulitis of toe of right foot 04/11/2015  . Nonspecific chest pain   . Chest pain 10/26/2014  . Obesity 10/26/2014  . Hypokalemia 10/26/2014  . Manic bipolar I disorder ([place]) 10/26/2014  . Atypical angina ([place]) 10/26/2014  . Tardive dyskinesia 08/26/2011  . Manic bipolar I disorder with rapid cycling ([place]) 05/18/2011    Class: Acute  . Asthma 02/24/2011  . Essential hypertension 02/24/2011  . History of migraine headaches 02/24/2011    Carney Living PT DPT  08/23/2018, 10:07 AM  [facility] [address] [place], [place], [address] Phone: [phone]   Fax:  [phone]  Name: MIRAYAH WREN MRN: [id number] Date of Birth: [birth year]

## 2018-08-29 ENCOUNTER — Ambulatory Visit: Payer: 59

## 2018-08-31 ENCOUNTER — Ambulatory Visit: Payer: 59

## 2018-09-05 ENCOUNTER — Ambulatory Visit: Payer: 59 | Admitting: Physical Therapy

## 2018-09-08 ENCOUNTER — Other Ambulatory Visit: Payer: Self-pay | Admitting: Family Medicine

## 2018-09-12 ENCOUNTER — Ambulatory Visit: Payer: 59 | Admitting: Physical Therapy

## 2018-09-14 ENCOUNTER — Other Ambulatory Visit: Payer: Self-pay | Admitting: Family Medicine

## 2018-09-24 ENCOUNTER — Other Ambulatory Visit: Payer: Self-pay | Admitting: Family Medicine

## 2018-09-26 ENCOUNTER — Other Ambulatory Visit: Payer: Self-pay

## 2018-09-26 ENCOUNTER — Ambulatory Visit: Payer: 59 | Attending: Orthopedic Surgery | Admitting: Physical Therapy

## 2018-09-26 DIAGNOSIS — M25572 Pain in left ankle and joints of left foot: Secondary | ICD-10-CM | POA: Diagnosis present

## 2018-09-26 DIAGNOSIS — M25672 Stiffness of left ankle, not elsewhere classified: Secondary | ICD-10-CM | POA: Diagnosis present

## 2018-09-26 DIAGNOSIS — R262 Difficulty in walking, not elsewhere classified: Secondary | ICD-10-CM | POA: Diagnosis present

## 2018-09-26 NOTE — Therapy (Signed)
[place], [place], [address] Phone: [phone]   Fax:  [phone]  Physical Therapy Treatment / Discharge Summary  Patient Details  Name: [name] [name] MRN: [id number] Date of Birth: 20-Aug-1965 Referring Provider (PT): Earlie Server, MD   Encounter Date: 09/26/2018  PT End of Session - 09/26/18 1348    Visit Number  5    Number of Visits  8    Date for PT Re-Evaluation  09/26/18    Authorization Type  [employer] MCR/ Medicaid    PT Start Time  1347    PT Stop Time  1425    PT Time Calculation (min)  38 min    Activity Tolerance  Patient tolerated treatment well    Behavior During Therapy  [facility] for tasks assessed/performed       Past Medical History:  Diagnosis Date  . Anxiety   . Asthma    mild intermittent  . Bipolar 1 disorder ([place])    ect treatments last treatment Sep 02 1011  . Depression   . GERD (gastroesophageal reflux disease)   . Hypertension   . Pre-diabetes   . Seizures ([place])    last seizure was so long ago she can't remember.   . Sleep apnea    wears CPAP, uncertain of setting    Past Surgical History:  Procedure Laterality Date  . ABDOMINAL HYSTERECTOMY    . AMPUTATION TOE Left 02/02/2018   Procedure: AMPUTATION TOE Left 4th toe;  Surgeon: [name], DPM;  Location: [facility];  Service: Podiatry;  Laterality: Left;  . BACK SURGERY    . CARPAL TUNNEL RELEASE     x2  . Laproscopic knee surgery    . NECK SURGERY  03/15/2017  . PLANTAR FASCIA RELEASE     x2    There were no vitals filed for this visit.  Subjective Assessment - 09/26/18 1353    Subjective  "no pain, except for the rain. otherwise no complaints"    Currently in Pain?  No/denies    Pain Score  0-No pain    Pain Location  Ankle    Pain Orientation  Left    Aggravating Factors   weather         [facility] PT Assessment - 09/26/18 1348      Assessment   Medical Diagnosis  LT ankle sprain     Referring  Provider (PT)  Earlie Server, MD    Onset Date/Surgical Date  05/09/18    Next MD Visit  As needed      AROM   Left Ankle Dorsiflexion  8    Left Ankle Plantar Flexion  68    Left Ankle Inversion  32    Left Ankle Eversion  28      Strength   Left Ankle Dorsiflexion  4+/5    Left Ankle Plantar Flexion  5/5    Left Ankle Inversion  5/5    Left Ankle Eversion  5/5                   [facility] Adult PT Treatment/Exercise - 09/26/18 0001      Exercises   Exercises  Knee/Hip      Knee/Hip Exercises: Standing   Hip Abduction  10 reps;Stengthening;1 set;Both    Hip Extension  1 set;Both;Stengthening;10 reps;Knee straight    Gait Training  walking to avoid a limp and stopping completely and restarting with as normal pattern as possible  Knee/Hip Exercises: Seated   Sit to Sand  1 set;10 reps   verbal cues/ demonstration for proper form     Ankle Exercises: Seated   Other Seated Ankle Exercises  4 way ankle strengthenig 1 x 20 ea. way             PT Education - 09/26/18 1420    Education Details  reviewed previously provided HEP, and updated for hip strengthening, importance of consistency to prevent reoccurrence of symptoms. proper gait and avoiding limping to reduce stress/ strain on muscles along the kinetic chaing.     Person(s) Educated  Patient    Methods  Explanation;Verbal cues;Handout    Comprehension  Verbalized understanding;Verbal cues required       PT Short Term Goals - 09/26/18 1403      PT SHORT TERM GOAL #1   Title  She will be indpendent with initial HEp     Period  Weeks    Status  Achieved      PT SHORT TERM GOAL #2   Title  Pt will be able to walk with  ASO with , less feeling of pain /instability.     Time  4    Period  Weeks    Status  Achieved      PT SHORT TERM GOAL #3   Title  AROM equal to RT ankle    Time  4    Period  Weeks    Status  Achieved        PT Long Term Goals - 09/26/18 1404      PT LONG TERM GOAL #1    Title  She will be independent with all HEP issued    Time  8    Period  Weeks      PT LONG TERM GOAL #2   Title  She will be able to walk with no support in home and intermittant use of ASO/aircast  type brace for community access with 1-2 max pain.     Time  8    Period  Weeks    Status  Achieved      PT LONG TERM GOAL #3   Title  She will demo normal strength with no pain on testing. to facilitate return to normal walking    Period  Weeks    Status  Achieved      PT LONG TERM GOAL #4   Title  She will be able to walk up and down stairs  with one rail with 1-2 max pain    Time  8    Period  Weeks    Status  Achieved            Plan - 09/26/18 1421    Clinical Impression Statement  [name] arrives to PT after 1 month due to COVID related closures / clinic limitations. She has functional ankle ROM and strength. updated Hep for hip strengthening to promote proximal control for the ankle. She met all goals today and is able to maintain current level of function independently and will be discharged from Pt today.     PT Treatment/Interventions  Taping;Passive range of motion;Manual techniques;Therapeutic exercise;Patient/family education;Gait training;Stair training;Cryotherapy;Iontophoresis '4mg'$ /ml Dexamethasone;Ultrasound    PT Next Visit Plan  D/C    PT Home Exercise Plan  towel exer , DF passive stretch, standing hip abduction, extension, sit to stand    Consulted and Agree with Plan of Care  Patient  Patient will benefit from skilled therapeutic intervention in order to improve the following deficits and impairments:  Pain, Difficulty walking, Decreased range of motion, Decreased strength  Visit Diagnosis: Pain in left ankle and joints of left foot  Stiffness of left ankle, not elsewhere classified  Difficulty in walking, not elsewhere classified     Problem List Patient Active Problem List   Diagnosis Date Noted  . Foot ulcer ([place]) 01/31/2018  .  Hyperglycemia 01/31/2018  . Left leg cellulitis 09/24/2017  . Cellulitis 09/24/2017  . Syncope and collapse   . Near syncope 11/23/2016  . Abdominal pain 09/23/2016  . Anxiety 09/23/2016  . 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[place]) 09/23/2016  . Constipation 09/23/2016  . DDD (degenerative disc disease), lumbosacral 09/23/2016  . Migraines 09/23/2016  . Myelopathy ([place]) 09/23/2016  . Nausea 09/23/2016  . Post laminectomy syndrome 09/23/2016  . Pseudoarthrosis of lumbar spine 09/23/2016  . Seizures ([place]) 09/23/2016  . Shortness of breath 09/23/2016  . Sleep apnea 09/23/2016  . Spinal stenosis of lumbar region 09/23/2016  . Vertigo 09/23/2016  . 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 Chronic pain syndrome   . Esophageal reflux   . Depression   . Rash 11/14/2015  . Toe ulcer, right ([place]) 04/11/2015  . Cellulitis of toe of right foot 04/11/2015  . Nonspecific chest pain   . Chest pain 10/26/2014  . Obesity 10/26/2014  . Hypokalemia 10/26/2014  . Manic bipolar I disorder ([place]) 10/26/2014  . Atypical angina ([place]) 10/26/2014  . Tardive dyskinesia 08/26/2011  . Manic bipolar I disorder with rapid cycling ([place]) 05/18/2011    Class: Acute  . Asthma 02/24/2011  . Essential hypertension 02/24/2011  . History of migraine headaches 02/24/2011    [place] 09/26/2018, 2:27 PM  [facility] [address] [place], [place], [address] Phone: [phone]   Fax:  [phone]  Name: KORBIN NOTARO MRN: [id number] Date of Birth: [birth year]       PHYSICAL THERAPY DISCHARGE SUMMARY  Visits from Start of Care: 5  Current functional level related to goals / functional outcomes: See goals   Remaining deficits: N/A   Education / Equipment: HEP, theraband, posture  Plan: Patient agrees to discharge.  Patient goals were met. Patient is being discharged due to being pleased with the current functional level.  ?????         Henry Utsey PT, DPT, LAT, ATC  09/26/18  2:28 PM

## 2018-09-29 ENCOUNTER — Telehealth: Payer: Self-pay | Admitting: Cardiovascular Disease

## 2018-09-29 NOTE — Telephone Encounter (Signed)
verbal consent/ smartphone/ my chart/ pre reg completed

## 2018-10-04 ENCOUNTER — Telehealth: Payer: Self-pay | Admitting: *Deleted

## 2018-10-04 NOTE — Telephone Encounter (Signed)
Finally got in touch with patient to cancel appointment for tomorrow with Dr. [name]. Will reschedule.

## 2018-10-05 ENCOUNTER — Telehealth: Payer: 59 | Admitting: Cardiovascular Disease

## 2018-10-06 ENCOUNTER — Telehealth: Payer: Self-pay | Admitting: Cardiovascular Disease

## 2018-10-06 NOTE — Telephone Encounter (Signed)
I do not do Dr. Kennon Holter schedules. I can send a message to [place] to try to get her scheduled.

## 2018-10-06 NOTE — Telephone Encounter (Signed)
Pt called to report that she had an app yesterday with Dr. [name] but she was called to cancel and was told she would be put on today, but she never heard back. She is upset and was looking forward to being seen since she has been having increased palpitations with SOB and sharp pain in her chest with exertion. She said this has been happening for several weeks, but getting worse.   I advised pt I will forward to the nurses in clinic for review for possible appt.

## 2018-10-06 NOTE — Telephone Encounter (Signed)
Received secure chat message from Lysbeth Galas stating pt has been set up for an appt. Pt scheduled for 6/25 with Kerin Ransom, PA-C

## 2018-10-06 NOTE — Telephone Encounter (Signed)
New message   Patient is wanting to know why her appt for today was cancelled and wants to get the appt rescheduled at the earliest time available.    Patient c/o Palpitations:  High priority if patient c/o lightheadedness, shortness of breath, or chest pain  1) How long have you had palpitations/irregular HR/ Afib? Are you having the symptoms now? Over a year, yes   2) Are you currently experiencing lightheadedness, SOB or CP? Sob   3) Do you have a history of afib (atrial fibrillation) or irregular heart rhythm? Yes   4) Have you checked your BP or HR? (document readings if available): hr 79 then went to 101  5) Are you experiencing any other symptoms? No

## 2018-10-11 ENCOUNTER — Telehealth: Payer: Self-pay | Admitting: Cardiology

## 2018-10-11 NOTE — Telephone Encounter (Signed)
Attempted twice to call patien with reminder about masks and no visitors to accompany.  Patient unable to hear me. She kept saying Hello, Hello.

## 2018-10-12 ENCOUNTER — Ambulatory Visit: Payer: 59 | Admitting: Cardiology

## 2018-10-23 ENCOUNTER — Other Ambulatory Visit: Payer: Self-pay | Admitting: Pain Medicine

## 2018-10-23 ENCOUNTER — Ambulatory Visit
Admission: RE | Admit: 2018-10-23 | Discharge: 2018-10-23 | Disposition: A | Discharge status: Home / Self Care | Payer: 59 | Source: Ambulatory Visit | Attending: Pain Medicine | Admitting: Pain Medicine

## 2018-10-23 ENCOUNTER — Other Ambulatory Visit: Payer: Self-pay

## 2018-10-23 DIAGNOSIS — M25561 Pain in right knee: Secondary | ICD-10-CM

## 2018-10-23 DIAGNOSIS — M25562 Pain in left knee: Secondary | ICD-10-CM

## 2018-10-25 ENCOUNTER — Telehealth: Payer: Self-pay | Admitting: Cardiology

## 2018-10-25 NOTE — Telephone Encounter (Signed)
LVM, reminding pt of her appt with Kerin Ransom on 10-26-18.Marland Kitchen

## 2018-10-26 ENCOUNTER — Ambulatory Visit (INDEPENDENT_AMBULATORY_CARE_PROVIDER_SITE_OTHER): Payer: 59 | Admitting: Cardiology

## 2018-10-26 ENCOUNTER — Other Ambulatory Visit: Payer: Self-pay

## 2018-10-26 ENCOUNTER — Encounter: Payer: Self-pay | Admitting: Cardiology

## 2018-10-26 VITALS — BP 126/78 | HR 72 | Temp 98.2°F | Ht 64.0 in | Wt 229.2 lb

## 2018-10-26 DIAGNOSIS — G4733 Obstructive sleep apnea (adult) (pediatric): Secondary | ICD-10-CM | POA: Diagnosis not present

## 2018-10-26 DIAGNOSIS — F3131 Bipolar disorder, current episode depressed, mild: Secondary | ICD-10-CM | POA: Diagnosis not present

## 2018-10-26 DIAGNOSIS — R002 Palpitations: Secondary | ICD-10-CM | POA: Diagnosis not present

## 2018-10-26 MED ORDER — METOPROLOL SUCCINATE ER 25 MG PO TB24: 25.0000 mg | ORAL_TABLET | Freq: Every day | ORAL | 3 refills | Status: DC

## 2018-10-26 NOTE — Assessment & Plan Note (Signed)
Reports compliance with C-pap 

## 2018-10-26 NOTE — Assessment & Plan Note (Signed)
BMI 39 

## 2018-10-26 NOTE — Progress Notes (Signed)
Cardiology Office Note:    Date:  10/26/2018   ID:  TATUM CORL, DOB [birth year], MRN [id number]  PCP:  [name], MD  Cardiologist:  Quay Burow, MD  Electrophysiologist:  None   Referring MD: [name], MD   plapitations  History of Present Illness:    [name] [name] is a pleasant, obese, 53 y.o. female with a hx of chest pain and palpitations.  She was evaluated in 2018, echocardiogram and nuclear stress were essentially normal, monitor done at that time showed no significant arrhythmia.  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  Previously she was treated with low-dose Toprol, 12.5 mg daily.  Patient is in the office today with complaints of increasing palpitations over the last several months.  She describes 1 minute or less episodes of tachycardia.  She is not taking over-the-counter decongestants and has avoided caffeine.  She does admit that she has been under a great deal of stress, she has 1 son in prison, one son is a drug addict, and she is taking care of her niece who was taken from her parents by social services.  She denies any syncope or sustained tachycardia.  Past Medical History:  Diagnosis Date  . Anxiety   . Asthma    mild intermittent  . Bipolar 1 disorder ([place])    ect treatments last treatment Sep 02 1011  . Depression   . GERD (gastroesophageal reflux disease)   . Hypertension   . Pre-diabetes   . Seizures ([place])    last seizure was so long ago she can't remember.   . Sleep apnea    wears CPAP, uncertain of setting    Past Surgical History:  Procedure Laterality Date  . ABDOMINAL HYSTERECTOMY    . AMPUTATION TOE Left 02/02/2018   Procedure: AMPUTATION TOE Left 4th toe;  Surgeon: [name], DPM;  Location: [facility];  Service: Podiatry;  Laterality: Left;  . BACK SURGERY    . CARPAL TUNNEL RELEASE     x2  .  Laproscopic knee surgery    . NECK SURGERY  03/15/2017  . PLANTAR FASCIA RELEASE     x2    Current Medications: Current Meds  Medication Sig  . busPIRone (BUSPAR) 15 MG tablet Take 15 mg by mouth 3 (three) times daily.   . Calcium Carbonate-Vit D-Min (CALCIUM 1200 PO) Take 1 tablet by mouth daily.  . cyclobenzaprine (FLEXERIL) 10 MG tablet Take 10 mg by mouth 2 (two) times daily as needed for muscle spasms.   . Dextran 70-Hypromellose, PF, (ARTIFICIAL TEARS PF) 0.1-0.3 % SOLN Place 1-2 drops into both eyes 3 (three) times daily as needed (for dry eyes).  . DULoxetine (CYMBALTA) 60 MG capsule TAKE 1 CAPSULE BY MOUTH EVERY DAY  . estradiol (ESTRACE) 1 MG tablet TAKE 1 TABLET BY MOUTH EVERY DAY  . fluticasone (FLONASE) 50 MCG/ACT nasal spray PLACE 2 SPRAYS INTO BOTH NOSTRILS DAILY.  Marland Kitchen gabapentin (NEURONTIN) 600 MG tablet Take 600 mg by mouth 4 (four) times daily.  . haloperidol (HALDOL) 5 MG tablet Take 1 tablet (5 mg total) by mouth at bedtime. For psychosis  . KLOR-CON M20 20 MEQ tablet TAKE TWO TABLETS (40 MEQ) BY MOUTH EVERY DAY  . levocetirizine (XYZAL) 5 MG tablet TAKE 1 TABLET BY MOUTH EVERY DAY IN THE EVENING  . lidocaine (XYLOCAINE) 5 % ointment Apply 1-2 grams (1  gram = 1 inch) to the affected area 2-3 times daily for 30 days.  Marland Kitchen LINZESS 290 MCG CAPS capsule TAKE 1 CAPSULE EVERY MORNING BEFORE BREAKFAST  . meloxicam (MOBIC) 15 MG tablet Take 15 mg by mouth daily.  . metoprolol succinate (TOPROL-XL) 25 MG 24 hr tablet Take 1 tablet (25 mg total) by mouth daily.  Marland Kitchen morphine (MS CONTIN) 30 MG 12 hr tablet Take 30 mg by mouth every 8 (eight) hours.   . Multiple Vitamins-Minerals (MULTIVITAMIN WITH MINERALS) tablet Take 1 tablet by mouth daily.  . pantoprazole (PROTONIX) 40 MG tablet TAKE ONE TABLET BY MOUTH TWICE DAILY  . PROAIR HFA 108 (90 Base) MCG/ACT inhaler Inhale 2 puffs = 185mcg into the lungs every 6 (six) hours as needed for wheezing or shortness of breath.  . promethazine  (PHENERGAN) 25 MG tablet Take 1 tablet (25 mg total) by mouth every 8 (eight) hours as needed for nausea or vomiting.  . SUMAtriptan (IMITREX) 50 MG tablet Take 1 tablet (50 mg total) by mouth every 2 (two) hours as needed for migraine. May repeat in 2 hours if headache persists or recurs.  . topiramate (TOPAMAX) 50 MG tablet TAKE 1 TABLET BY MOUTH TWICE A DAY  . traZODone (DESYREL) 100 MG tablet Take 100 mg by mouth at bedtime.  Marland Kitchen XIIDRA 5 % SOLN   . [DISCONTINUED] metoprolol succinate (TOPROL-XL) 25 MG 24 hr tablet TAKE 1/2 TABLET (12.5 MG) BY MOUTH EVERY DAY     Allergies:   Penicillins, Tetracyclines & related, Tramadol, Phenazopyridine, Tetracycline, Ciprofloxacin, Codeine, and Estradiol   Social History   Socioeconomic History  . Marital status: Widowed    Spouse name: Not on file  . Number of children: 3  . Years of education: 13  . Highest education level: Not on file  Occupational History  . Occupation: Disabled  Social Needs  . Financial resource strain: Not on file  . Food insecurity    Worry: Not on file    Inability: Not on file  . Transportation needs    Medical: Not on file    Non-medical: Not on file  Tobacco Use  . Smoking status: Never Smoker  . Smokeless tobacco: Never Used  Substance and Sexual Activity  . Alcohol use: No  . Drug use: No  . Sexual activity: Not on file  Lifestyle  . Physical activity    Days per week: Not on file    Minutes per session: Not on file  . Stress: Not on file  Relationships  . Social Herbalist on phone: Not on file    Gets together: Not on file    Attends religious service: Not on file    Active member of club or organization: Not on file    Attends meetings of clubs or organizations: Not on file    Relationship status: Not on file  Other Topics Concern  . Not on file  Social History Narrative   Patient drink 4 16oz caffeine drinks a day      Family History: The patient's family history includes COPD in  her mother; Cancer in her father; Diabetes in her mother; Heart disease in her brother, daughter, father, maternal grandfather, maternal grandmother, and paternal grandfather; Hyperlipidemia in her father and mother; Hypertension in her father, maternal grandmother, and mother; Kidney cancer in her paternal grandmother.  ROS:   Please see the history of present illness.     All other systems reviewed and are negative.  EKGs/Labs/Other Studies Reviewed:    The following studies were reviewed today: Echo June 2018 Myoview June 208 Monitor June 2018  EKG:  EKG is ordered today.  The ekg ordered today demonstrates NSR, HR 72  Recent Labs: 02/20/2018: ALT 11; BUN 11; Creat 0.58; Hemoglobin 12.2; Platelets 279; Potassium 3.9; Sodium 139  Recent Lipid Panel    Component Value Date/Time   CHOL 167 02/20/2018 1608   TRIG 211 (H) 02/20/2018 1608   HDL 55 02/20/2018 1608   CHOLHDL 3.0 02/20/2018 1608   VLDL 45 (H) 11/18/2016 1522   LDLCALC 81 02/20/2018 1608    Physical Exam:    VS:  BP 126/78   Pulse 72   Ht 5\' 4"  (1.626 m)   Wt 229 lb 3.2 oz (104 kg)   BMI 39.34 kg/m     Wt Readings from Last 3 Encounters:  10/26/18 229 lb 3.2 oz (104 kg)  07/03/18 231 lb (104.8 kg)  05/23/18 225 lb (102.1 kg)     GEN:  Obese female well developed in no acute distress HEENT: Normal NECK: No JVD; No carotid bruits LYMPHATICS: No lymphadenopathy CARDIAC: RRR, no murmurs, rubs, gallops RESPIRATORY:  Clear to auscultation without rales, wheezing or rhonchi  ABDOMEN: Soft, non-tender, non-distended MUSCULOSKELETAL:  No edema; No deformity  SKIN: Warm and dry NEUROLOGIC:  Alert and oriented x 3 PSYCHIATRIC:  Normal affect   ASSESSMENT:    Palpitations Pt seen today with c/o increased palpitations (less than one minute duration). Increase Toprol to 25 mg daily.  Sleep apnea Reports compliance with C-pap  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[name]) Multiple medications and poor social  situation  Obesity (BMI 30-39.9) BMI 39  PLAN:    Increase Toprol to 25 mg daily.  If her symptoms don't improve with the increased dose I would do another monitor.I talked with her about her social stress situation.  She does go to counseling and social services are involved and helping.    Medication Adjustments/Labs and Tests Ordered: Current medicines are reviewed at length with the patient today.  Concerns regarding medicines are outlined above.  Orders Placed This Encounter  Procedures  . EKG 12-Lead   Meds ordered this encounter  Medications  . metoprolol succinate (TOPROL-XL) 25 MG 24 hr tablet    Sig: Take 1 tablet (25 mg total) by mouth daily.    Dispense:  90 tablet    Refill:  3    Patient Instructions  Medication Instructions:  INCREASE Metoprol to 25mg  Take 1 tablet once a day  If you need a refill on your cardiac medications before your next appointment, please call your pharmacy.   Lab work: None  If you have labs (blood work) drawn today and your tests are completely normal, you will receive your results only by: Marland Kitchen MyChart Message (if you have MyChart) OR . A paper copy in the mail If you have any lab test that is abnormal or we need to change your treatment, we will call you to review the results.  Testing/Procedures: None   Follow-Up: At [facility], you and your health needs are our priority.  As part of our continuing mission to provide you with exceptional heart care, we have created designated Provider Care Teams.  These Care Teams include your primary Cardiologist (physician) and Advanced Practice Providers (APPs -  Physician Assistants and Nurse Practitioners) who all work together to provide you with the care you need, when you need it. Marland Kitchen Jayonna Meyering RECOMMENDS YOU FOLLOW  UP ON A AS NEEDED BASIS.  Any Other Special Instructions Will Be Listed Below (If Applicable).      Angelena Form, PA-C  10/26/2018 9:29 AM    [facility]  HeartCare

## 2018-10-26 NOTE — Assessment & Plan Note (Signed)
Multiple medications and poor social situation

## 2018-10-26 NOTE — Assessment & Plan Note (Signed)
Pt seen today with c/o increased palpitations (less than one minute duration). Increase Toprol to 25 mg daily.

## 2018-10-26 NOTE — Patient Instructions (Addendum)
Medication Instructions:  INCREASE Metoprol to 25mg  Take 1 tablet once a day  If you need a refill on your cardiac medications before your next appointment, please call your pharmacy.   Lab work: None  If you have labs (blood work) drawn today and your tests are completely normal, you will receive your results only by: Marland Kitchen MyChart Message (if you have MyChart) OR . A paper copy in the mail If you have any lab test that is abnormal or we need to change your treatment, we will call you to review the results.  Testing/Procedures: None   Follow-Up: At [facility], you and your health needs are our priority.  As part of our continuing mission to provide you with exceptional heart care, we have created designated Provider Care Teams.  These Care Teams include your primary Cardiologist (physician) and Advanced Practice Providers (APPs -  Physician Assistants and Nurse Practitioners) who all work together to provide you with the care you need, when you need it. Marland Kitchen [place] RECOMMENDS YOU FOLLOW UP ON A AS NEEDED BASIS.  Any Other Special Instructions Will Be Listed Below (If Applicable).

## 2018-11-09 ENCOUNTER — Other Ambulatory Visit: Payer: Self-pay | Admitting: Cardiovascular Disease

## 2018-12-04 ENCOUNTER — Other Ambulatory Visit: Payer: Self-pay | Admitting: Orthopaedic Surgery

## 2018-12-04 DIAGNOSIS — M545 Low back pain, unspecified: Secondary | ICD-10-CM

## 2018-12-04 DIAGNOSIS — M546 Pain in thoracic spine: Secondary | ICD-10-CM

## 2018-12-11 ENCOUNTER — Other Ambulatory Visit: Payer: Self-pay | Admitting: Family Medicine

## 2018-12-11 ENCOUNTER — Other Ambulatory Visit: Payer: Self-pay | Admitting: Orthopaedic Surgery

## 2018-12-11 DIAGNOSIS — J328 Other chronic sinusitis: Secondary | ICD-10-CM

## 2018-12-11 DIAGNOSIS — M545 Low back pain, unspecified: Secondary | ICD-10-CM

## 2018-12-11 DIAGNOSIS — M546 Pain in thoracic spine: Secondary | ICD-10-CM

## 2018-12-12 ENCOUNTER — Ambulatory Visit
Admission: RE | Admit: 2018-12-12 | Discharge: 2018-12-12 | Disposition: A | Discharge status: Home / Self Care | Payer: 59 | Source: Ambulatory Visit | Attending: Orthopaedic Surgery | Admitting: Orthopaedic Surgery

## 2018-12-12 DIAGNOSIS — M546 Pain in thoracic spine: Secondary | ICD-10-CM

## 2018-12-12 DIAGNOSIS — M545 Low back pain, unspecified: Secondary | ICD-10-CM

## 2018-12-15 ENCOUNTER — Other Ambulatory Visit: Payer: Self-pay | Admitting: Family Medicine

## 2018-12-15 ENCOUNTER — Encounter: Payer: Self-pay | Admitting: Family Medicine

## 2018-12-18 MED ORDER — LINACLOTIDE 290 MCG PO CAPS: ORAL_CAPSULE | ORAL | 3 refills | Status: DC

## 2018-12-23 IMAGING — CR DG TIBIA/FIBULA 2V*L*
4 series · 4 of 4 positions shown · non-contrast
Comparison: None.

CLINICAL DATA: Ulceration medially in the distal lower leg. History
of skin ulcers and pre diabetes. History of neuropathy.

EXAM:
LEFT TIBIA AND FIBULA - 2 VIEW

[tibia ap (1 of 2)]
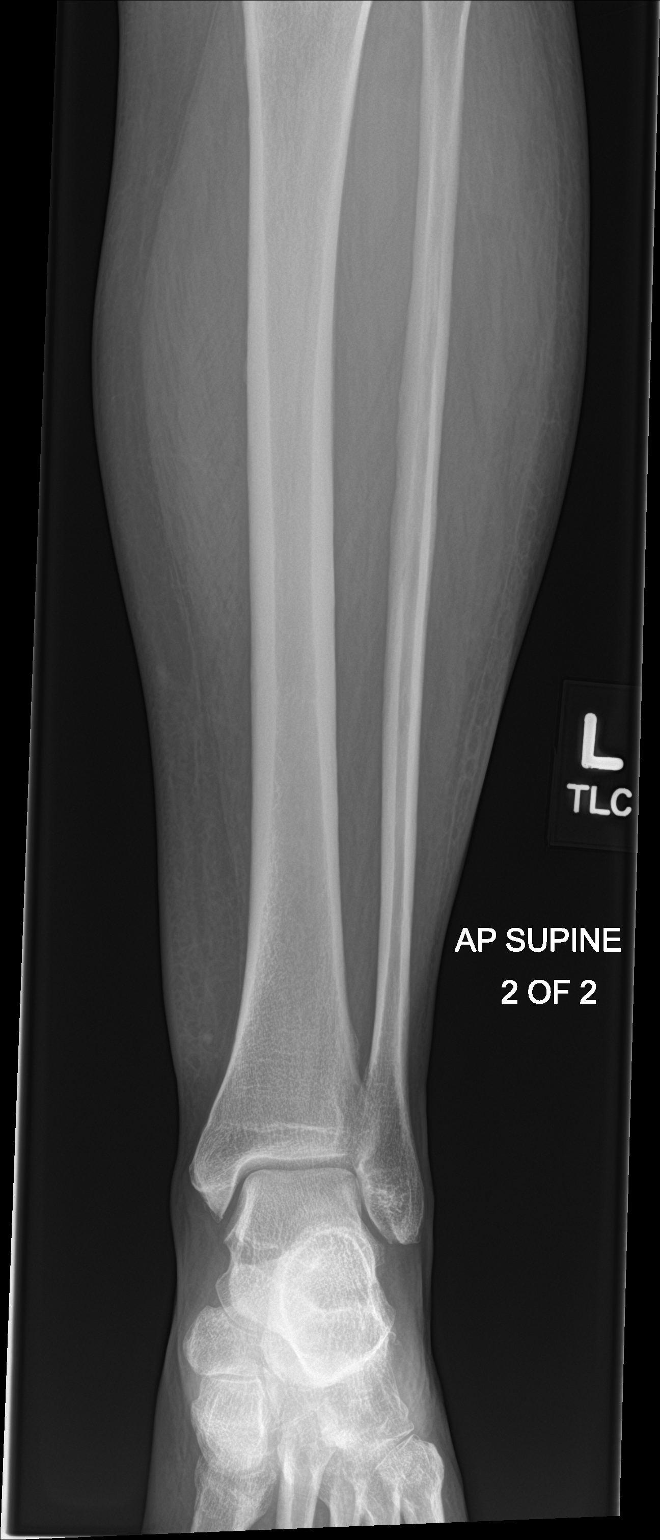

[tibia ap (2 of 2)]
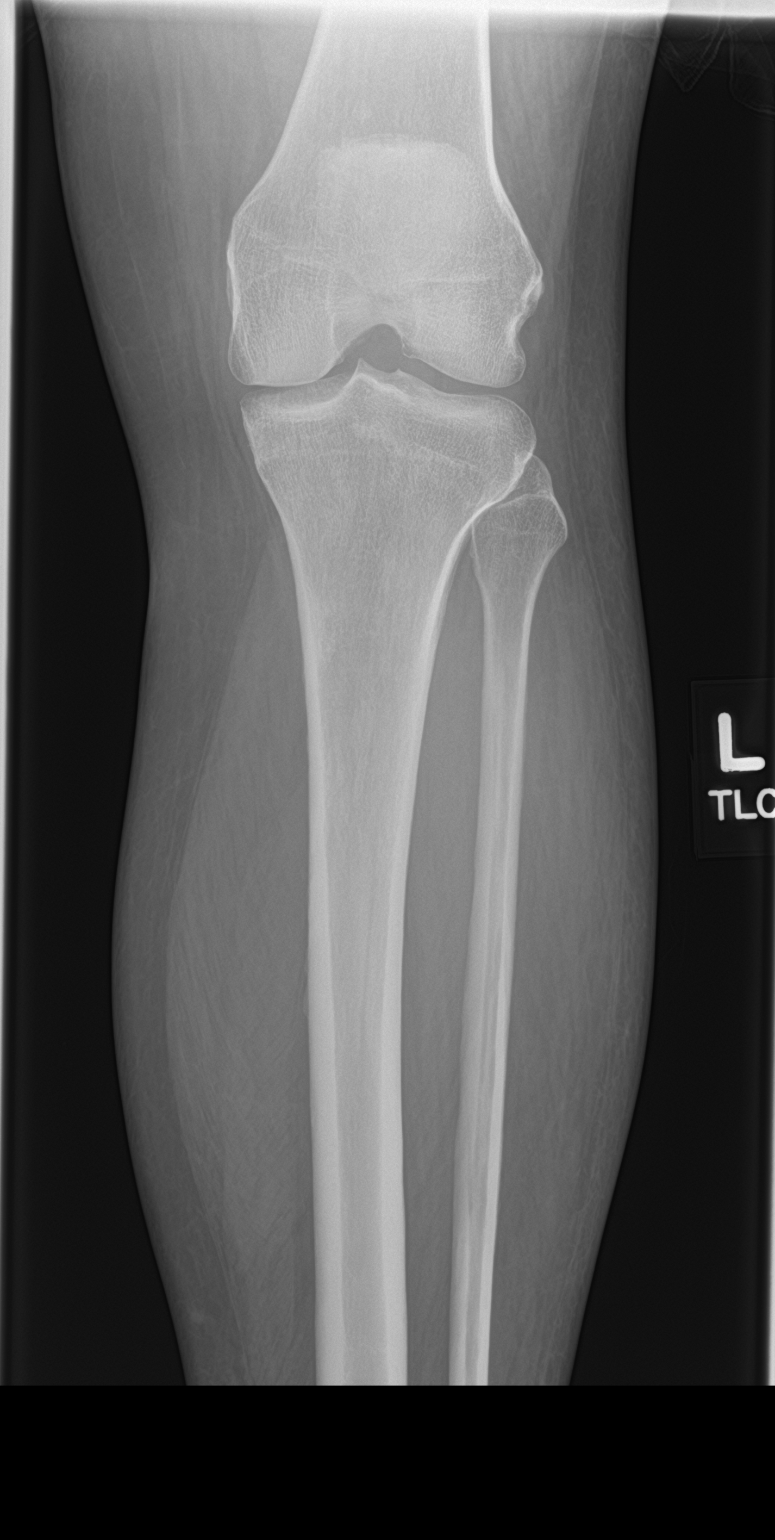

[tibia lat (1 of 2)]
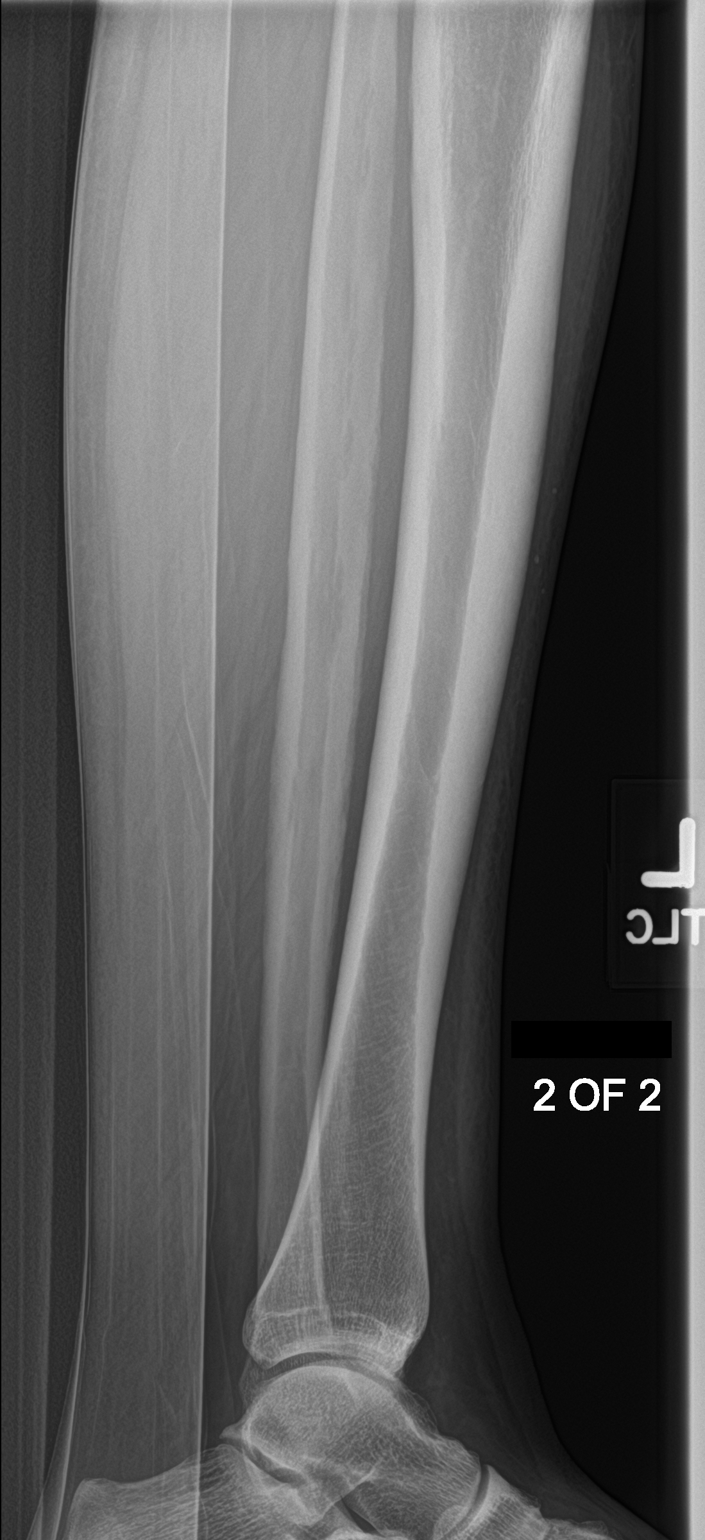

[tibia lat (2 of 2)]
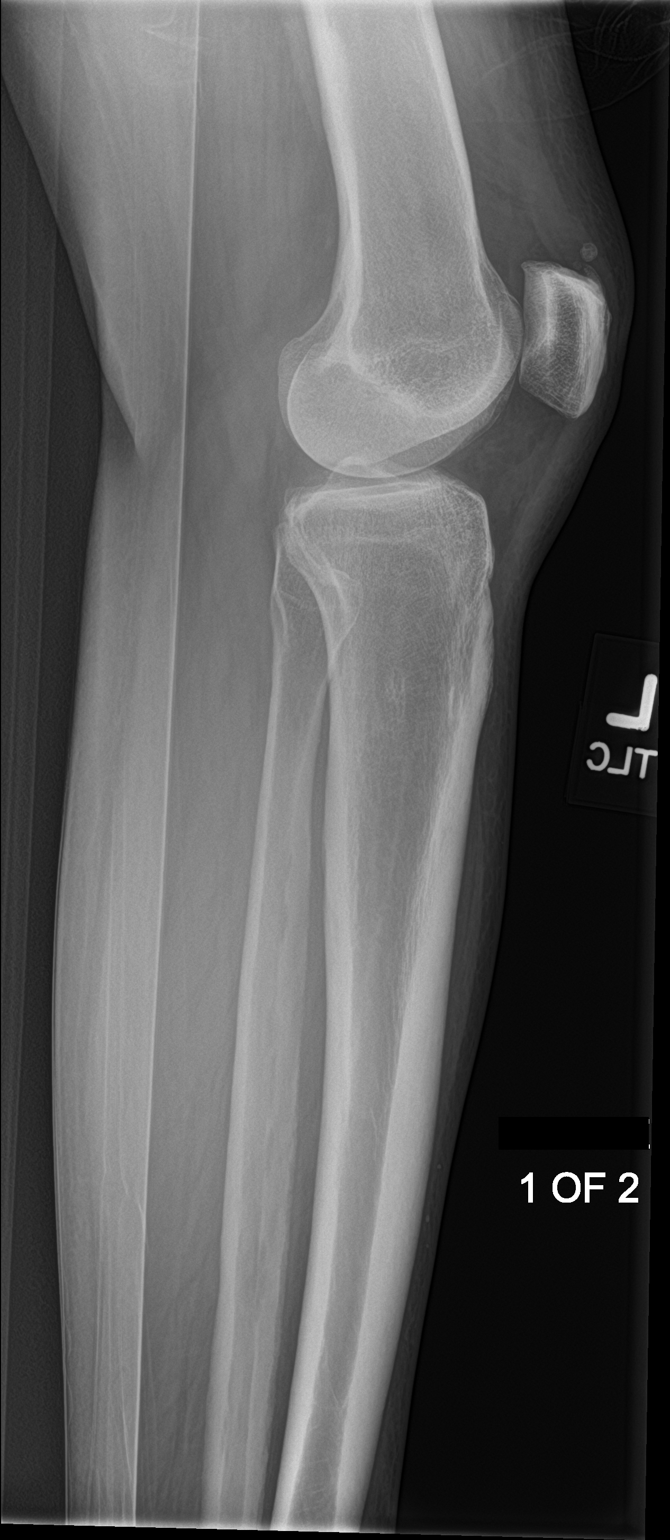

[4 of 4 positions shown; findings below may reference images not displayed]

FINDINGS: The mineralization and alignment are normal. There is no evidence of
acute fracture or dislocation. There is no evidence of bone
destruction. Mild spurring at the quadriceps insertion on the
patella is noted. There is some edema medially in the distal lower
leg, but no evidence of soft tissue emphysema or foreign body.
IMPRESSION: No acute osseous findings.  Medial soft tissue swelling distally.

## 2018-12-23 IMAGING — CR DG FOOT 2V*L*
2 series · 2 of 2 positions shown · non-contrast
Comparison: Left ankle MRI 04/14/2010.

CLINICAL DATA: History of foot ulcers. Current foot ulcer over the
plantar aspect of the foot near the head of the 1st metatarsal.
History of pre diabetes and neuropathy.

EXAM:
LEFT FOOT - 2 VIEW

[foot ap]
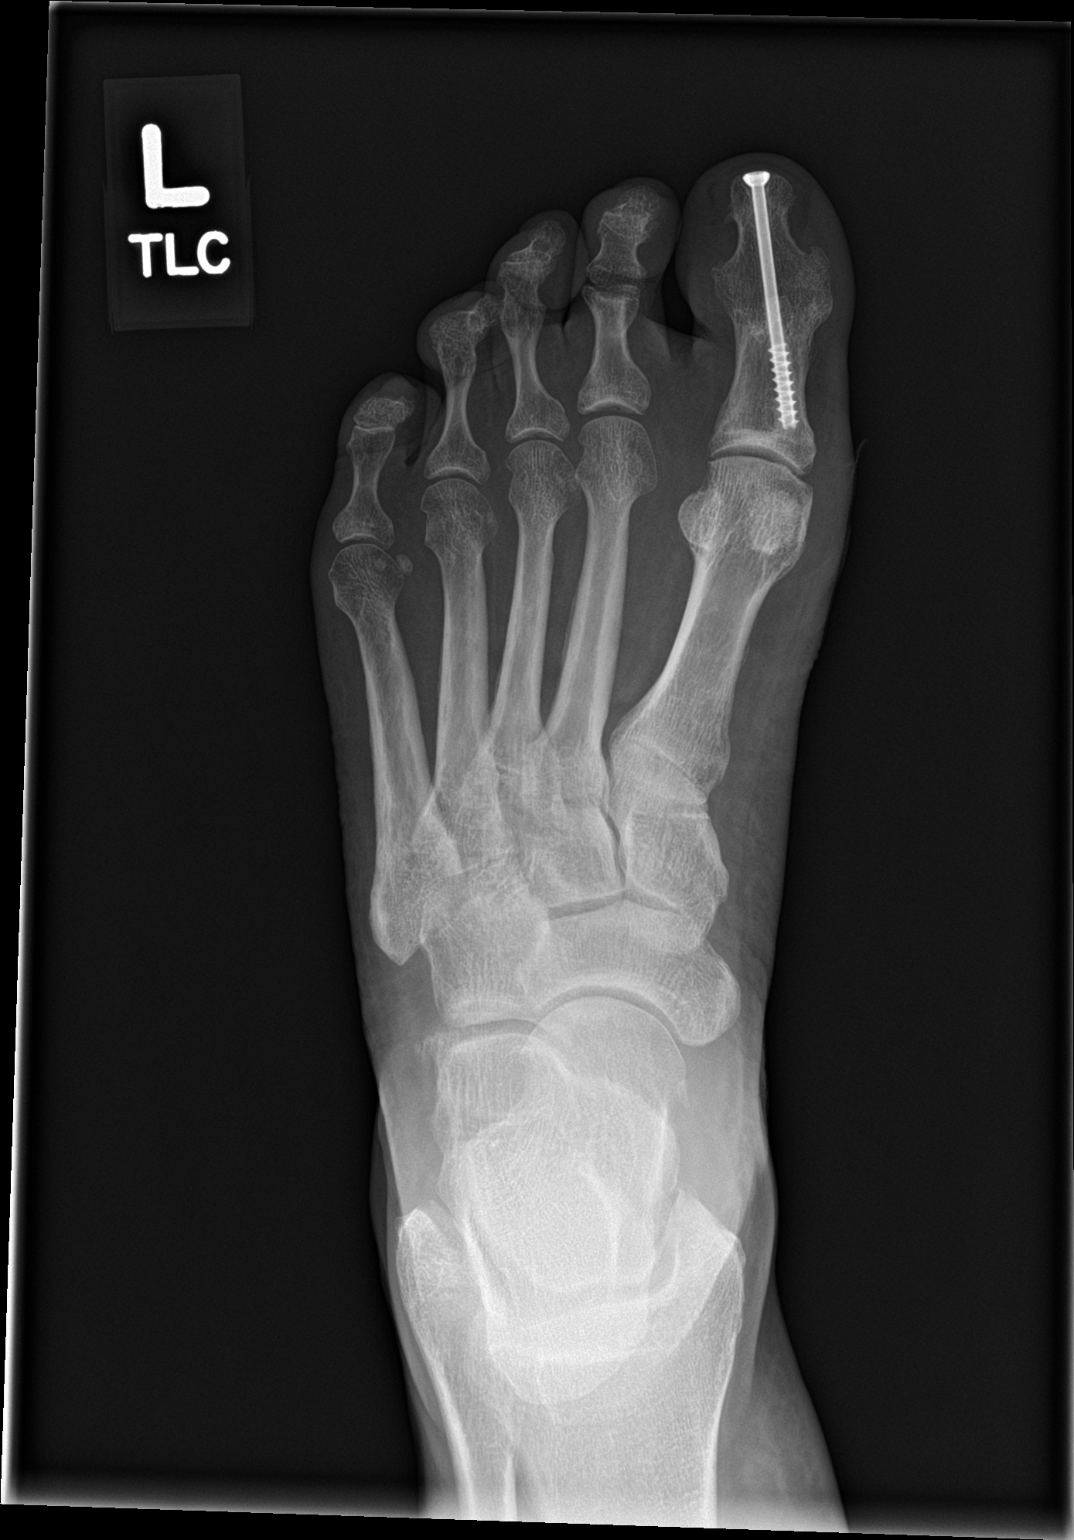

[foot lat]
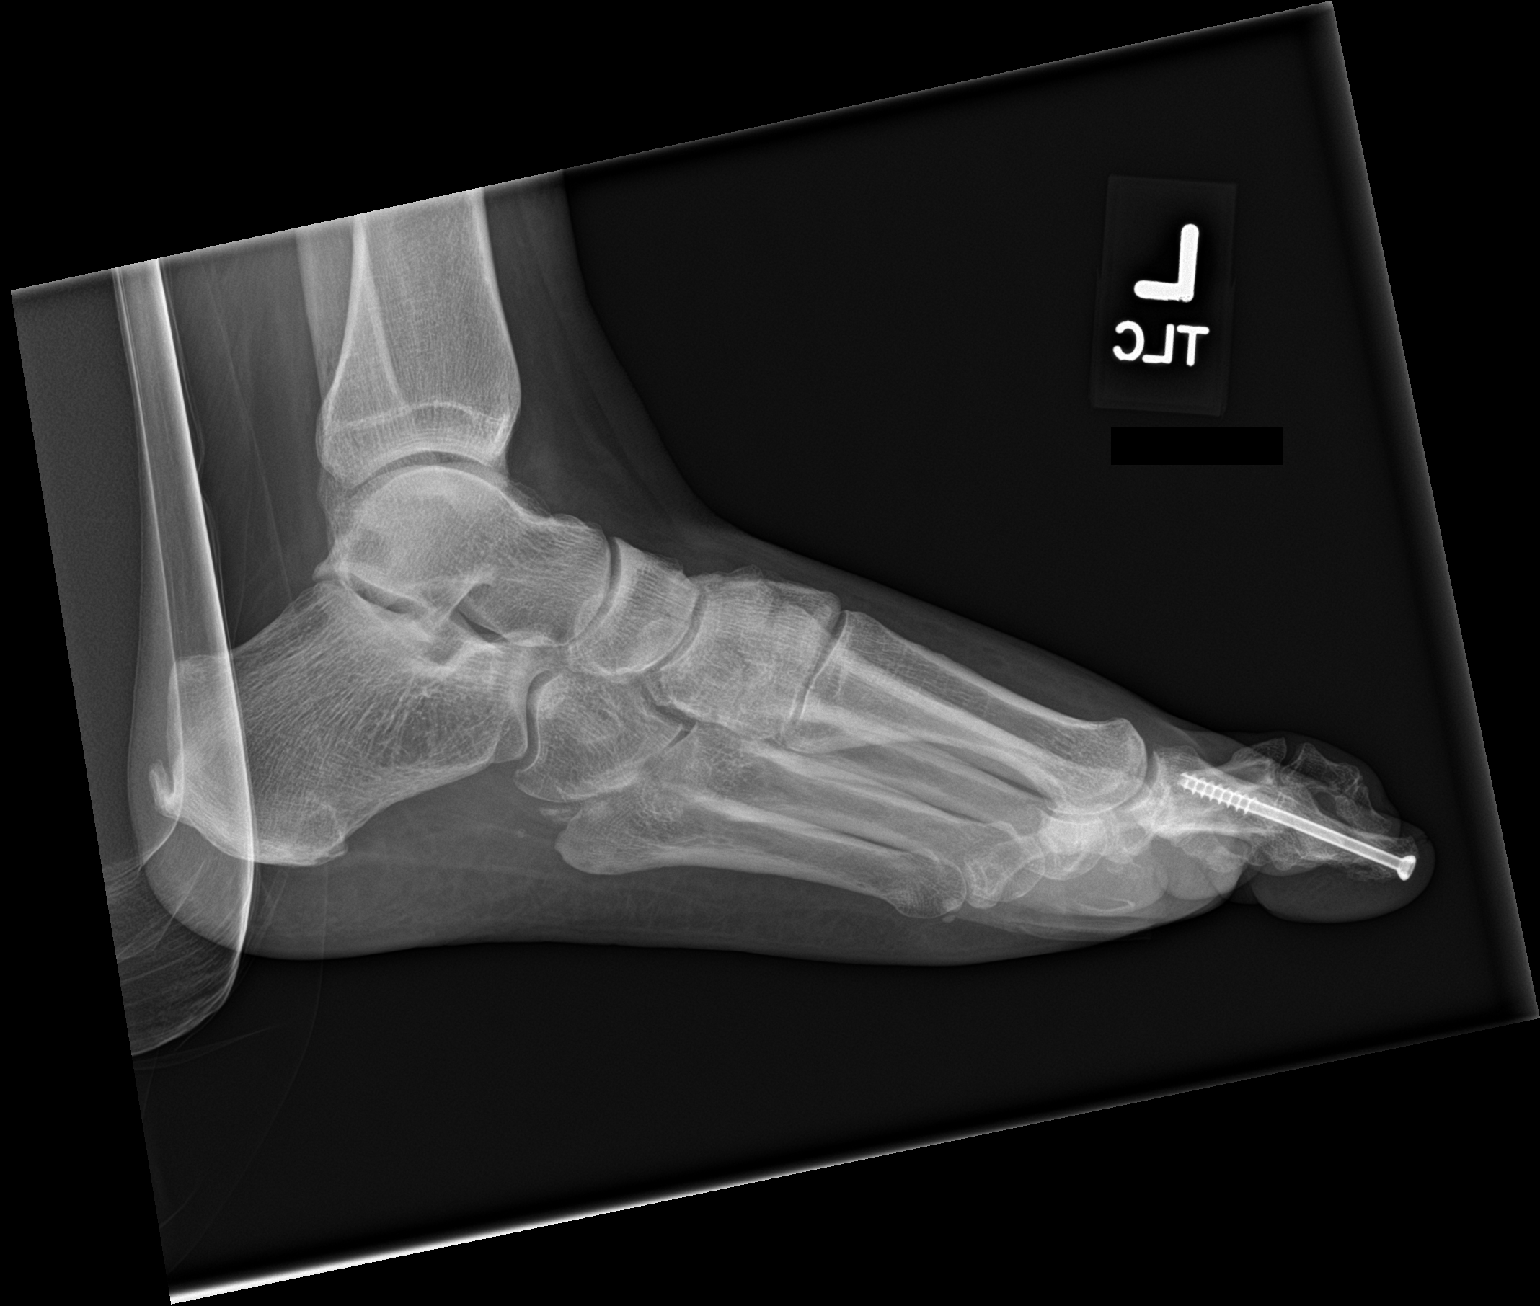

[2 of 2 positions shown; findings below may reference images not displayed]

FINDINGS: The mineralization and alignment are normal. There is no evidence of
acute fracture, dislocation or bone destruction. Patient is status
post cannulated screw arthrodesis of the interphalangeal joint of
the great toe. There are mild degenerative changes at the 1st
metatarsophalangeal joint. Possible fusion across the 3rd and 4th
proximal interphalangeal joints. Mild midfoot degenerative changes
and small posterior calcaneal spur. No soft tissue emphysema or
other foreign body seen.
IMPRESSION: No radiographic evidence of osteomyelitis. Degenerative and
postsurgical changes as described

## 2019-01-04 ENCOUNTER — Encounter: Payer: Self-pay | Admitting: Family Medicine

## 2019-01-05 ENCOUNTER — Other Ambulatory Visit: Payer: Self-pay | Admitting: Family Medicine

## 2019-01-05 ENCOUNTER — Other Ambulatory Visit: Payer: Self-pay | Admitting: Rehabilitation

## 2019-01-05 DIAGNOSIS — M5412 Radiculopathy, cervical region: Secondary | ICD-10-CM

## 2019-01-05 MED ORDER — NYSTATIN 100000 UNIT/GM EX POWD: Freq: Four times a day (QID) | CUTANEOUS | 0 refills | Status: DC

## 2019-01-22 ENCOUNTER — Other Ambulatory Visit: Payer: Self-pay

## 2019-01-22 ENCOUNTER — Ambulatory Visit
Admission: RE | Admit: 2019-01-22 | Discharge: 2019-01-22 | Disposition: A | Discharge status: Home / Self Care | Payer: 59 | Source: Ambulatory Visit | Attending: Rehabilitation | Admitting: Rehabilitation

## 2019-01-22 DIAGNOSIS — M5412 Radiculopathy, cervical region: Secondary | ICD-10-CM

## 2019-01-22 MED ORDER — GADOBENATE DIMEGLUMINE 529 MG/ML IV SOLN
20.0000 mL | Freq: Once | INTRAVENOUS | Status: AC | PRN
  Administered 2019-01-22: 20 mL via INTRAVENOUS

## 2019-02-08 ENCOUNTER — Ambulatory Visit: Payer: 59 | Admitting: Family Medicine

## 2019-02-08 ENCOUNTER — Ambulatory Visit (INDEPENDENT_AMBULATORY_CARE_PROVIDER_SITE_OTHER): Payer: 59 | Admitting: Family Medicine

## 2019-02-08 ENCOUNTER — Other Ambulatory Visit: Payer: Self-pay

## 2019-02-08 VITALS — BP 112/80 | HR 72 | Temp 97.6°F | Resp 16 | Ht 63.5 in | Wt 235.0 lb

## 2019-02-08 DIAGNOSIS — I10 Essential (primary) hypertension: Secondary | ICD-10-CM

## 2019-02-08 DIAGNOSIS — G43001 Migraine without aura, not intractable, with status migrainosus: Secondary | ICD-10-CM

## 2019-02-08 DIAGNOSIS — Z23 Encounter for immunization: Secondary | ICD-10-CM | POA: Diagnosis not present

## 2019-02-08 DIAGNOSIS — Z0001 Encounter for general adult medical examination with abnormal findings: Secondary | ICD-10-CM | POA: Diagnosis not present

## 2019-02-08 DIAGNOSIS — Z0000 Encounter for general adult medical examination without abnormal findings: Secondary | ICD-10-CM

## 2019-02-08 MED ORDER — POTASSIUM CHLORIDE CRYS ER 20 MEQ PO TBCR: EXTENDED_RELEASE_TABLET | ORAL | 1 refills | Status: DC

## 2019-02-08 MED ORDER — AIMOVIG 70 MG/ML ~~LOC~~ SOAJ: 70.0000 mg | SUBCUTANEOUS | 3 refills | Status: DC

## 2019-02-08 MED ORDER — SUMATRIPTAN SUCCINATE 50 MG PO TABS: 50.0000 mg | ORAL_TABLET | ORAL | 0 refills | Status: DC | PRN

## 2019-02-08 NOTE — Progress Notes (Signed)
Subjective:    Patient ID: [name] [name], female    DOB: 04-Dec-1965, 53 y.o.   MRN: [id number]  HPI Patient is here today for complete physical exam.  Her last colonoscopy was reported in January 2013.  She is due for this again in 2023.  Her last mammogram was January 2019.  This is overdue.  However she prefers to wait and schedule this with her mother who also needs a mammogram.  Her last bone density was performed in 2019 and had a T score of -0.5.  Therefore this is up-to-date for the next 2 years.  She has a history of a total abdominal hysterectomy and therefore does not require Pap smear.  Immunizations are up-to-date except for a flu shot which she would like to receive today.  She does report breakthrough migraines which are happening more frequently.  She reports 3-4 headaches every week and she is having to take Imitrex 3-4 times a month for severe migraines.  She is on Topamax 100 mg a day total and is interested in possibly increasing this however she is already on gabapentin 600 mg 4 times daily and I worry about polypharmacy and drug to drug interactions.  She has never tried Aimovig. Past Medical History:  Diagnosis Date  . Anxiety   . Asthma    mild intermittent  . Bipolar 1 disorder ([place])    ect treatments last treatment Sep 02 1011  . Depression   . GERD (gastroesophageal reflux disease)   . Hypertension   . Pre-diabetes   . Seizures ([place])    last seizure was so long ago she can't remember.   . Sleep apnea    wears CPAP, uncertain of setting   Past Surgical History:  Procedure Laterality Date  . ABDOMINAL HYSTERECTOMY    . AMPUTATION TOE Left 02/02/2018   Procedure: AMPUTATION TOE Left 4th toe;  Surgeon: [name], DPM;  Location: [facility];  Service: Podiatry;  Laterality: Left;  . BACK SURGERY    . CARPAL TUNNEL RELEASE     x2  . Laproscopic knee surgery    . NECK SURGERY  03/15/2017  . PLANTAR FASCIA RELEASE     x2   Current Outpatient Medications  on File Prior to Visit  Medication Sig Dispense Refill  . busPIRone (BUSPAR) 15 MG tablet Take 15 mg by mouth 3 (three) times daily.   2  . Calcium Carbonate-Vit D-Min (CALCIUM 1200 PO) Take 1 tablet by mouth daily.    . cyclobenzaprine (FLEXERIL) 10 MG tablet Take 10 mg by mouth 2 (two) times daily as needed for muscle spasms.     . Dextran 70-Hypromellose, PF, (ARTIFICIAL TEARS PF) 0.1-0.3 % SOLN Place 1-2 drops into both eyes 3 (three) times daily as needed (for dry eyes).    . DULoxetine (CYMBALTA) 60 MG capsule TAKE 1 CAPSULE BY MOUTH EVERY DAY 90 capsule 2  . estradiol (ESTRACE) 1 MG tablet TAKE 1 TABLET BY MOUTH EVERY DAY 90 tablet 3  . fluticasone (FLONASE) 50 MCG/ACT nasal spray SPRAY 2 SPRAYS INTO EACH NOSTRIL EVERY DAY 48 mL 2  . gabapentin (NEURONTIN) 600 MG tablet Take 600 mg by mouth 4 (four) times daily.    . haloperidol (HALDOL) 5 MG tablet Take 1 tablet (5 mg total) by mouth at bedtime. For psychosis    . KLOR-CON M20 20 MEQ tablet TAKE TWO TABLETS (40 MEQ) BY MOUTH EVERY DAY 180 tablet 1  . levocetirizine (XYZAL) 5 MG tablet  TAKE 1 TABLET BY MOUTH EVERY DAY IN THE EVENING 90 tablet 3  . lidocaine (XYLOCAINE) 5 % ointment Apply 1-2 grams (1 gram = 1 inch) to the affected area 2-3 times daily for 30 days.  1  . linaclotide (LINZESS) 290 MCG CAPS capsule TAKE 1 CAPSULE EVERY MORNING BEFORE BREAKFAST 90 capsule 3  . LINZESS 290 MCG CAPS capsule TAKE 1 CAPSULE BY MOUTH EVERY DAY IN THE MORNING BEFORE BREAKFAST 90 capsule 3  . meloxicam (MOBIC) 15 MG tablet Take 15 mg by mouth daily.  2  . metoprolol succinate (TOPROL-XL) 25 MG 24 hr tablet Take 1 tablet (25 mg total) by mouth daily. 90 tablet 1  . morphine (MS CONTIN) 30 MG 12 hr tablet Take 30 mg by mouth every 8 (eight) hours.     . Multiple Vitamins-Minerals (MULTIVITAMIN WITH MINERALS) tablet Take 1 tablet by mouth daily.    Marland Kitchen nystatin (MYCOSTATIN/NYSTOP) powder Apply topically 4 (four) times daily. 60 g 0  . pantoprazole  (PROTONIX) 40 MG tablet TAKE ONE TABLET BY MOUTH TWICE DAILY 180 tablet 3  . PROAIR HFA 108 (90 Base) MCG/ACT inhaler Inhale 2 puffs = 19mcg into the lungs every 6 (six) hours as needed for wheezing or shortness of breath. 8.5 g 3  . promethazine (PHENERGAN) 25 MG tablet Take 1 tablet (25 mg total) by mouth every 8 (eight) hours as needed for nausea or vomiting. 20 tablet 0  . SUMAtriptan (IMITREX) 50 MG tablet Take 1 tablet (50 mg total) by mouth every 2 (two) hours as needed for migraine. May repeat in 2 hours if headache persists or recurs. 10 tablet 0  . topiramate (TOPAMAX) 50 MG tablet TAKE 1 TABLET BY MOUTH TWICE A DAY 180 tablet 2  . traZODone (DESYREL) 100 MG tablet Take 100 mg by mouth at bedtime.  0  . XIIDRA 5 % SOLN      No current facility-administered medications on file prior to visit.    Allergies  Allergen Reactions  . Penicillins Hives and Rash    Has patient had a PCN reaction causing immediate rash, facial/tongue/throat swelling, SOB or lightheadedness with hypotension: Yes Has patient had a PCN reaction causing severe rash involving mucus membranes or skin necrosis: No Has patient had a PCN reaction that required hospitalization: No Has patient had a PCN reaction occurring within the last 10 years: No If all of the above answers are "NO", then may proceed with Cephalosporin use.  . Tetracyclines & Related Other (See Comments)    Syncope and put her "in a coma"  . Tramadol Other (See Comments)    Seizures  . Phenazopyridine Other (See Comments)    Unknown  . Tetracycline Other (See Comments)    Syncope and "Put me in a coma"  . Ciprofloxacin Rash and Itching  . Codeine Itching and Rash  . Estradiol Rash    Patches broke out the skin   Social History   Socioeconomic History  . Marital status: Widowed    Spouse name: Not on file  . Number of children: 3  . Years of education: 81  . Highest education level: Not on file  Occupational History  . Occupation:  Disabled  Social Needs  . Financial resource strain: Not on file  . Food insecurity    Worry: Not on file    Inability: Not on file  . Transportation needs    Medical: Not on file    Non-medical: Not on file  Tobacco Use  .  Smoking status: Never Smoker  . Smokeless tobacco: Never Used  Substance and Sexual Activity  . Alcohol use: No  . Drug use: No  . Sexual activity: Not on file  Lifestyle  . Physical activity    Days per week: Not on file    Minutes per session: Not on file  . Stress: Not on file  Relationships  . Social Herbalist on phone: Not on file    Gets together: Not on file    Attends religious service: Not on file    Active member of club or organization: Not on file    Attends meetings of clubs or organizations: Not on file    Relationship status: Not on file  . Intimate partner violence    Fear of current or ex partner: Not on file    Emotionally abused: Not on file    Physically abused: Not on file    Forced sexual activity: Not on file  Other Topics Concern  . Not on file  Social History Narrative   Patient drink 4 16oz caffeine drinks a day    Family History  Problem Relation Age of Onset  . Diabetes Mother   . COPD Mother   . Hypertension Mother   . Hyperlipidemia Mother   . Heart disease Father   . Hyperlipidemia Father   . Hypertension Father   . Cancer Father   . Heart disease Brother   . Heart disease Daughter   . Heart disease Maternal Grandmother   . Hypertension Maternal Grandmother   . Heart disease Maternal Grandfather   . Kidney cancer Paternal Grandmother   . Heart disease Paternal Grandfather       Review of Systems  All other systems reviewed and are negative.      Objective:   Physical Exam  Constitutional: She is oriented to person, place, and time. She appears well-developed and well-nourished. No distress.  HENT:  Head: Normocephalic and atraumatic.  Right Ear: External ear normal.  Left Ear: External  ear normal.  Nose: Nose normal.  Mouth/Throat: Oropharynx is clear and moist. No oropharyngeal exudate.  Eyes: Pupils are equal, round, and reactive to light. Conjunctivae and EOM are normal. Right eye exhibits no discharge. Left eye exhibits no discharge. No scleral icterus.  Neck: Normal range of motion. Neck supple. No JVD present. No tracheal deviation present. No thyromegaly present.  Cardiovascular: Normal rate, regular rhythm, normal heart sounds and intact distal pulses. Exam reveals no gallop and no friction rub.  No murmur heard. Pulmonary/Chest: Effort normal and breath sounds normal. No stridor. No respiratory distress. She has no wheezes. She has no rales. She exhibits no tenderness.  Abdominal: Soft. Bowel sounds are normal. She exhibits no distension and no mass. There is no abdominal tenderness. There is no rebound and no guarding.  Musculoskeletal: Normal range of motion.        General: No tenderness or edema.  Lymphadenopathy:    She has no cervical adenopathy.  Neurological: She is alert and oriented to person, place, and time. She has normal reflexes. No cranial nerve deficit. She exhibits normal muscle tone. Coordination normal.  Skin: Skin is warm. No rash noted. She is not diaphoretic. No erythema. No pallor.  Psychiatric: She has a normal mood and affect. Her behavior is normal. Judgment and thought content normal.  Vitals reviewed.         Assessment & Plan:  General medical exam - Plan: CBC with Differential/Platelet, COMPLETE  METABOLIC PANEL WITH GFR, Lipid panel  Benign essential HTN - Plan: CBC with Differential/Platelet, COMPLETE METABOLIC PANEL WITH GFR, Lipid panel  Migraine without aura and with status migrainosus, not intractable  Physical exam today significant for elevated BMI.  Therapeutic lifestyle changes were encouraged.  Check CBC, CMP, fasting lipid panel.  Goal LDL cholesterol is less than 100.  Monitor fasting blood sugar.  Patient received  her flu shot.  I encouraged the patient to schedule her mammogram.  The remainder of her preventative care is up-to-date.  Patient is on gabapentin and Topamax with breakthrough migraines.  Therefore I recommended adding Aimovig 70 mg subcu monthly for migraine prevention.  I believe this would likely have fewer drug drug interactions and fewer side effects.

## 2019-02-09 ENCOUNTER — Telehealth: Payer: Self-pay | Admitting: Family Medicine

## 2019-02-09 LAB — COMPLETE METABOLIC PANEL WITH GFR
AG Ratio: 1.5 (calc) (ref 1.0–2.5)
ALT: 15 U/L (ref 6–29)
AST: 17 U/L (ref 10–35)
Albumin: 3.8 g/dL (ref 3.6–5.1)
Alkaline phosphatase (APISO): 67 U/L (ref 37–153)
BUN: 10 mg/dL (ref 7–25)
CO2: 26 mmol/L (ref 20–32)
Calcium: 9.4 mg/dL (ref 8.6–10.4)
Chloride: 105 mmol/L (ref 98–110)
Creat: 0.65 mg/dL (ref 0.50–1.05)
GFR, Est African American: 116 mL/min/{1.73_m2} (ref >=60)
GFR, Est Non African American: 100 mL/min/{1.73_m2} (ref >=60)
Globulin: 2.6 g/dL (calc) (ref 1.9–3.7)
Glucose, Bld: 90 mg/dL (ref 65–99)
Potassium: 4.4 mmol/L (ref 3.5–5.3)
Sodium: 139 mmol/L (ref 135–146)
Total Bilirubin: 0.4 mg/dL (ref 0.2–1.2)
Total Protein: 6.4 g/dL (ref 6.1–8.1)

## 2019-02-09 LAB — CBC WITH DIFFERENTIAL/PLATELET
Absolute Monocytes: 468 cells/uL (ref 200–950)
Basophils Absolute: 23 cells/uL (ref 0–200)
Basophils Relative: 0.3 %
Eosinophils Absolute: 242 cells/uL (ref 15–500)
Eosinophils Relative: 3.1 %
HCT: 36.2 % (ref 35.0–45.0)
Hemoglobin: 12.1 g/dL (ref 11.7–15.5)
Lymphs Abs: 2824 cells/uL (ref 850–3900)
MCH: 32.7 pg (ref 27.0–33.0)
MCHC: 33.4 g/dL (ref 32.0–36.0)
MCV: 97.8 fL (ref 80.0–100.0)
MPV: 9.5 fL (ref 7.5–12.5)
Monocytes Relative: 6 %
Neutro Abs: 4243 cells/uL (ref 1500–7800)
Neutrophils Relative %: 54.4 %
Platelets: 315 10*3/uL (ref 140–400)
RBC: 3.7 10*6/uL — ABNORMAL LOW (ref 3.80–5.10)
RDW: 12.6 % (ref 11.0–15.0)
Total Lymphocyte: 36.2 %
WBC: 7.8 10*3/uL (ref 3.8–10.8)

## 2019-02-09 LAB — LIPID PANEL
Cholesterol: 189 mg/dL (ref <200)
HDL: 71 mg/dL (ref >=50)
LDL Cholesterol (Calc): 89 mg/dL (calc)
Non-HDL Cholesterol (Calc): 118 mg/dL (calc) (ref <130)
Total CHOL/HDL Ratio: 2.7 (calc) (ref <5.0)
Triglycerides: 192 mg/dL — ABNORMAL HIGH (ref <150)

## 2019-02-09 NOTE — Telephone Encounter (Signed)
Prior authorization was submitted on covermymeds for Aimovig. Pending determination

## 2019-02-12 MED ORDER — AIMOVIG 70 MG/ML ~~LOC~~ SOAJ: 70.0000 mg | SUBCUTANEOUS | 3 refills | Status: DC

## 2019-02-12 NOTE — Addendum Note (Signed)
Addended by: Shary Decamp B on: 02/12/2019 03:48 PM   Modules accepted: Orders

## 2019-02-12 NOTE — Telephone Encounter (Signed)
Received PA determination.   PA- [id number] approved through 04/18/2020.  Pharmacy made aware.

## 2019-02-15 ENCOUNTER — Ambulatory Visit: Payer: 59 | Admitting: Family Medicine

## 2019-02-22 ENCOUNTER — Other Ambulatory Visit: Payer: Self-pay | Admitting: Family Medicine

## 2019-03-04 ENCOUNTER — Other Ambulatory Visit: Payer: Self-pay | Admitting: Family Medicine

## 2019-03-26 ENCOUNTER — Ambulatory Visit (INDEPENDENT_AMBULATORY_CARE_PROVIDER_SITE_OTHER): Payer: 59

## 2019-03-26 ENCOUNTER — Other Ambulatory Visit: Payer: Self-pay

## 2019-03-26 ENCOUNTER — Ambulatory Visit (INDEPENDENT_AMBULATORY_CARE_PROVIDER_SITE_OTHER): Payer: 59 | Admitting: Podiatry

## 2019-03-26 ENCOUNTER — Other Ambulatory Visit: Payer: Self-pay | Admitting: Podiatry

## 2019-03-26 DIAGNOSIS — L97519 Non-pressure chronic ulcer of other part of right foot with unspecified severity: Secondary | ICD-10-CM

## 2019-03-26 DIAGNOSIS — L02612 Cutaneous abscess of left foot: Secondary | ICD-10-CM

## 2019-03-26 DIAGNOSIS — L03032 Cellulitis of left toe: Secondary | ICD-10-CM

## 2019-03-26 DIAGNOSIS — M79671 Pain in right foot: Secondary | ICD-10-CM

## 2019-03-26 DIAGNOSIS — L97511 Non-pressure chronic ulcer of other part of right foot limited to breakdown of skin: Secondary | ICD-10-CM | POA: Diagnosis not present

## 2019-03-26 MED ORDER — MUPIROCIN 2 % EX OINT: 1.0000 "application " | TOPICAL_OINTMENT | Freq: Two times a day (BID) | CUTANEOUS | 2 refills | Status: DC

## 2019-03-26 MED ORDER — SULFAMETHOXAZOLE-TRIMETHOPRIM 800-160 MG PO TABS: 1.0000 | ORAL_TABLET | Freq: Two times a day (BID) | ORAL | 0 refills | Status: DC

## 2019-03-30 IMAGING — CR DG ABDOMEN 1V
1 series · 1 of 1 positions shown · non-contrast
Comparison: None.

CLINICAL DATA: Abdominal pain on the left

EXAM:
ABDOMEN - 1 VIEW

[w abdomen upright]
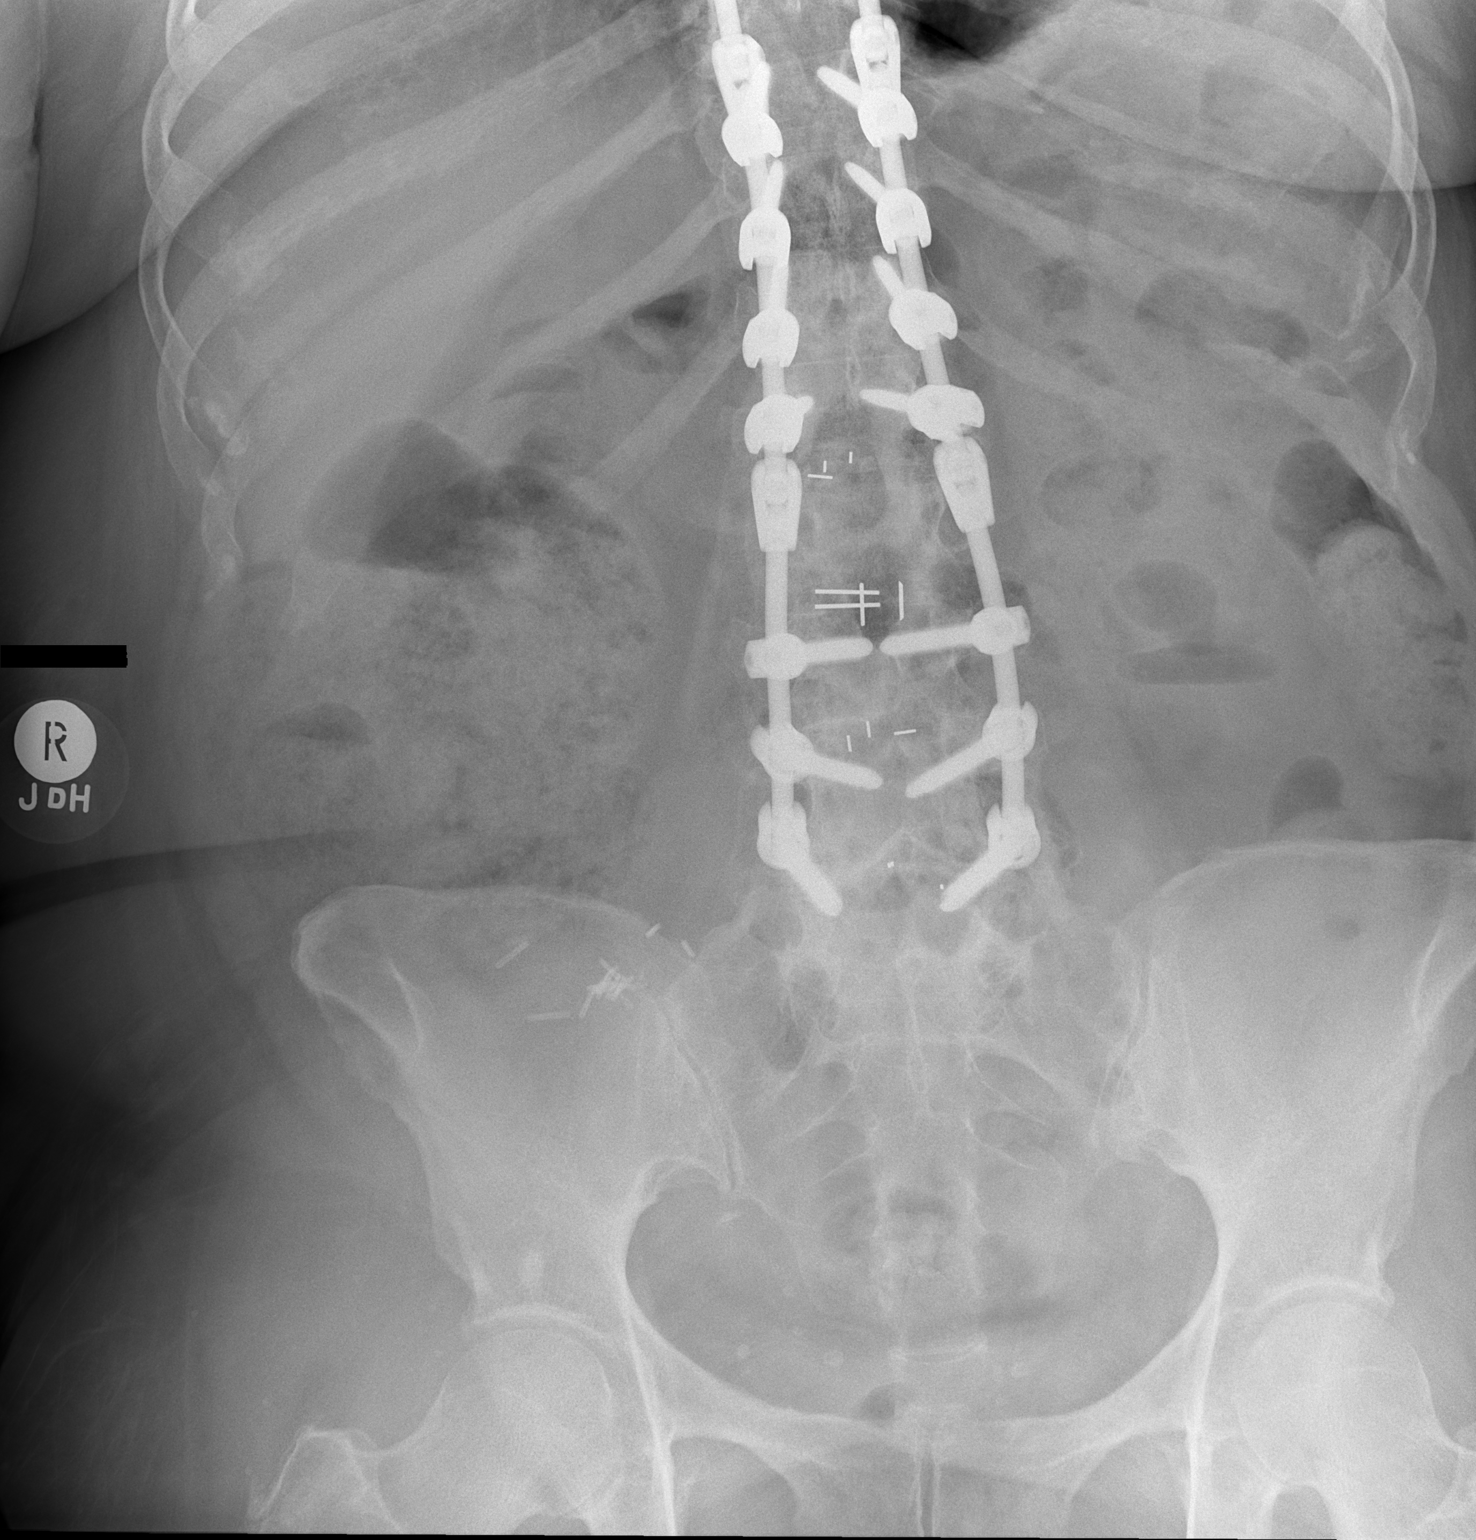

[1 of 1 positions shown; findings below may reference images not displayed]

FINDINGS: Scattered large and small bowel gas is noted. Mild retained fecal
material is noted. Extensive thoracic and lumbar surgical changes
are noted without hardware failure. No acute bony abnormality is
seen. No mass lesion is noted.
IMPRESSION: No acute abnormality noted.

## 2019-03-30 IMAGING — CR DG THORACIC SPINE 3V
3 series · 3 of 3 positions shown · non-contrast
Comparison: 09/23/2016

CLINICAL DATA: Fall 2 weeks ago with left-sided chest pain, initial
encounter

EXAM:
THORACIC SPINE - 3 VIEWS

[w thoracic spine ap]
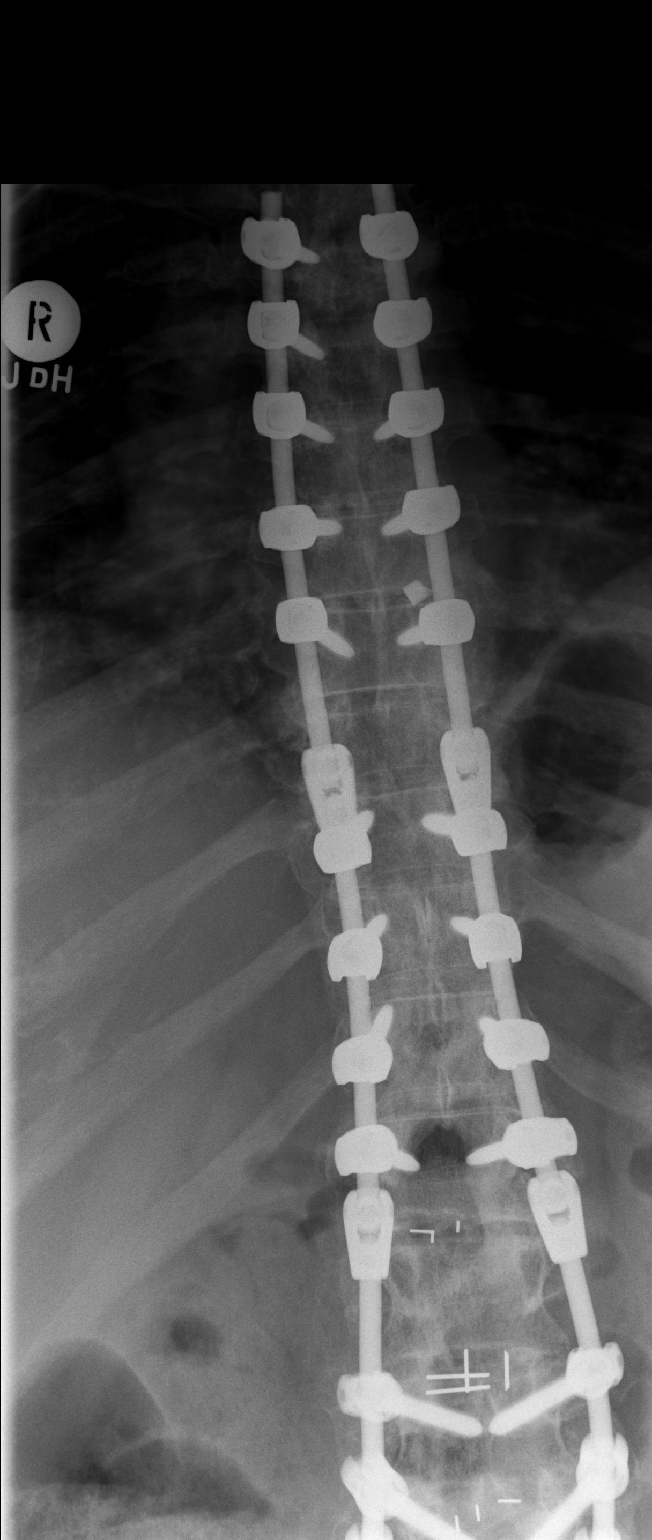

[w thoracic spine lat]
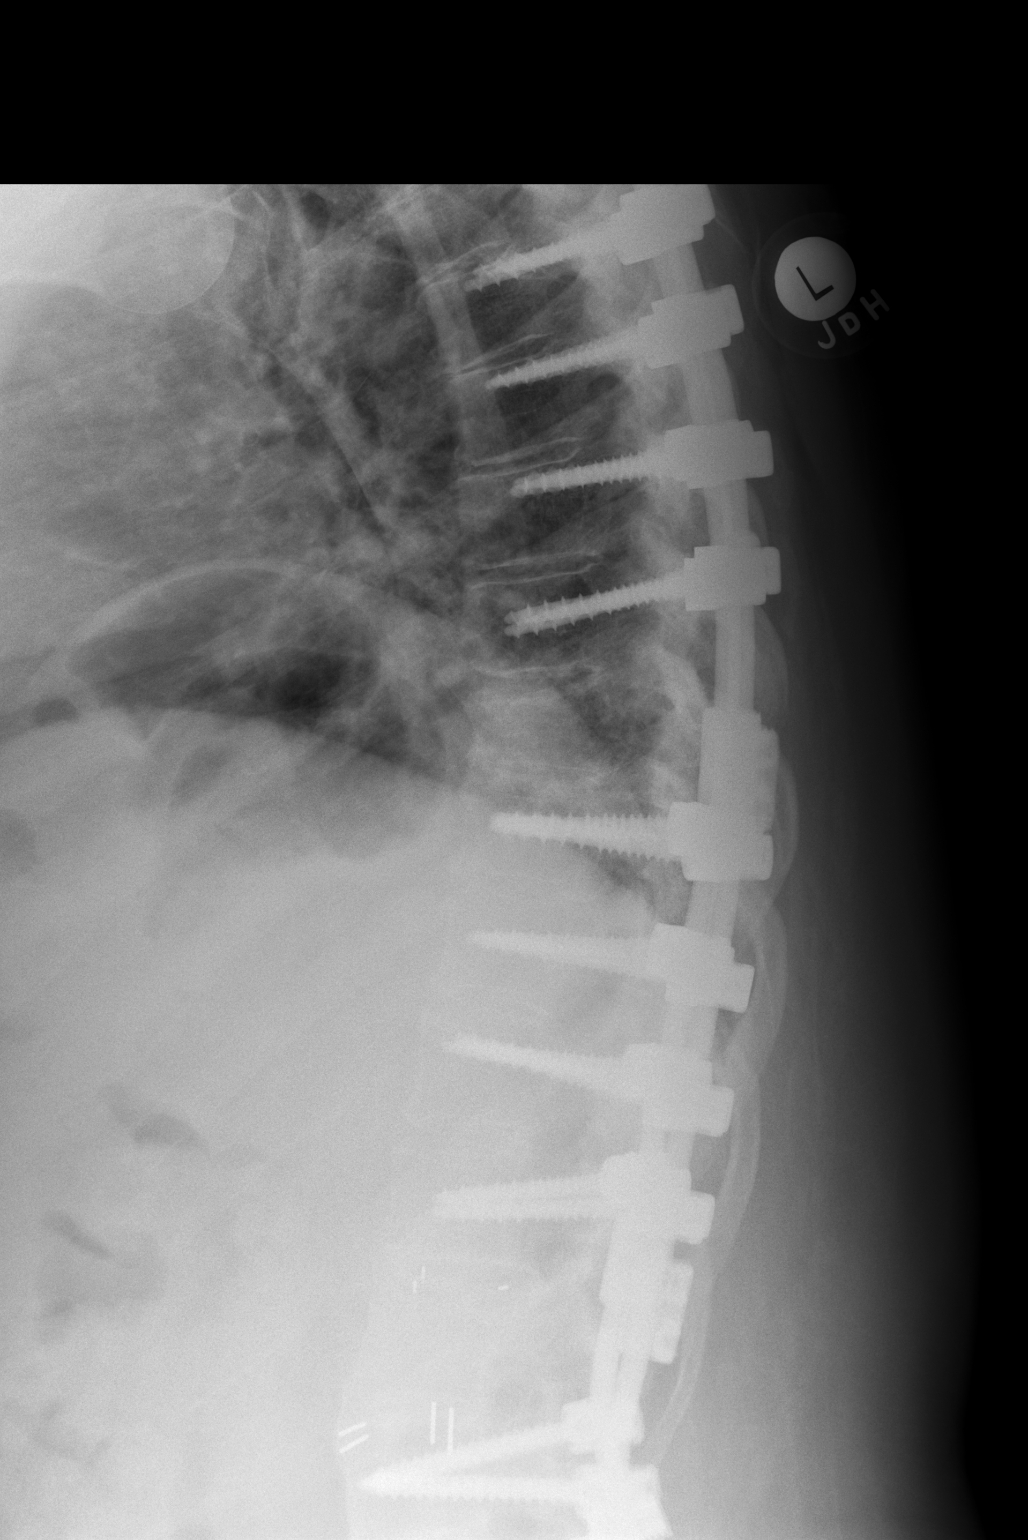

[w thoracic swimmers]
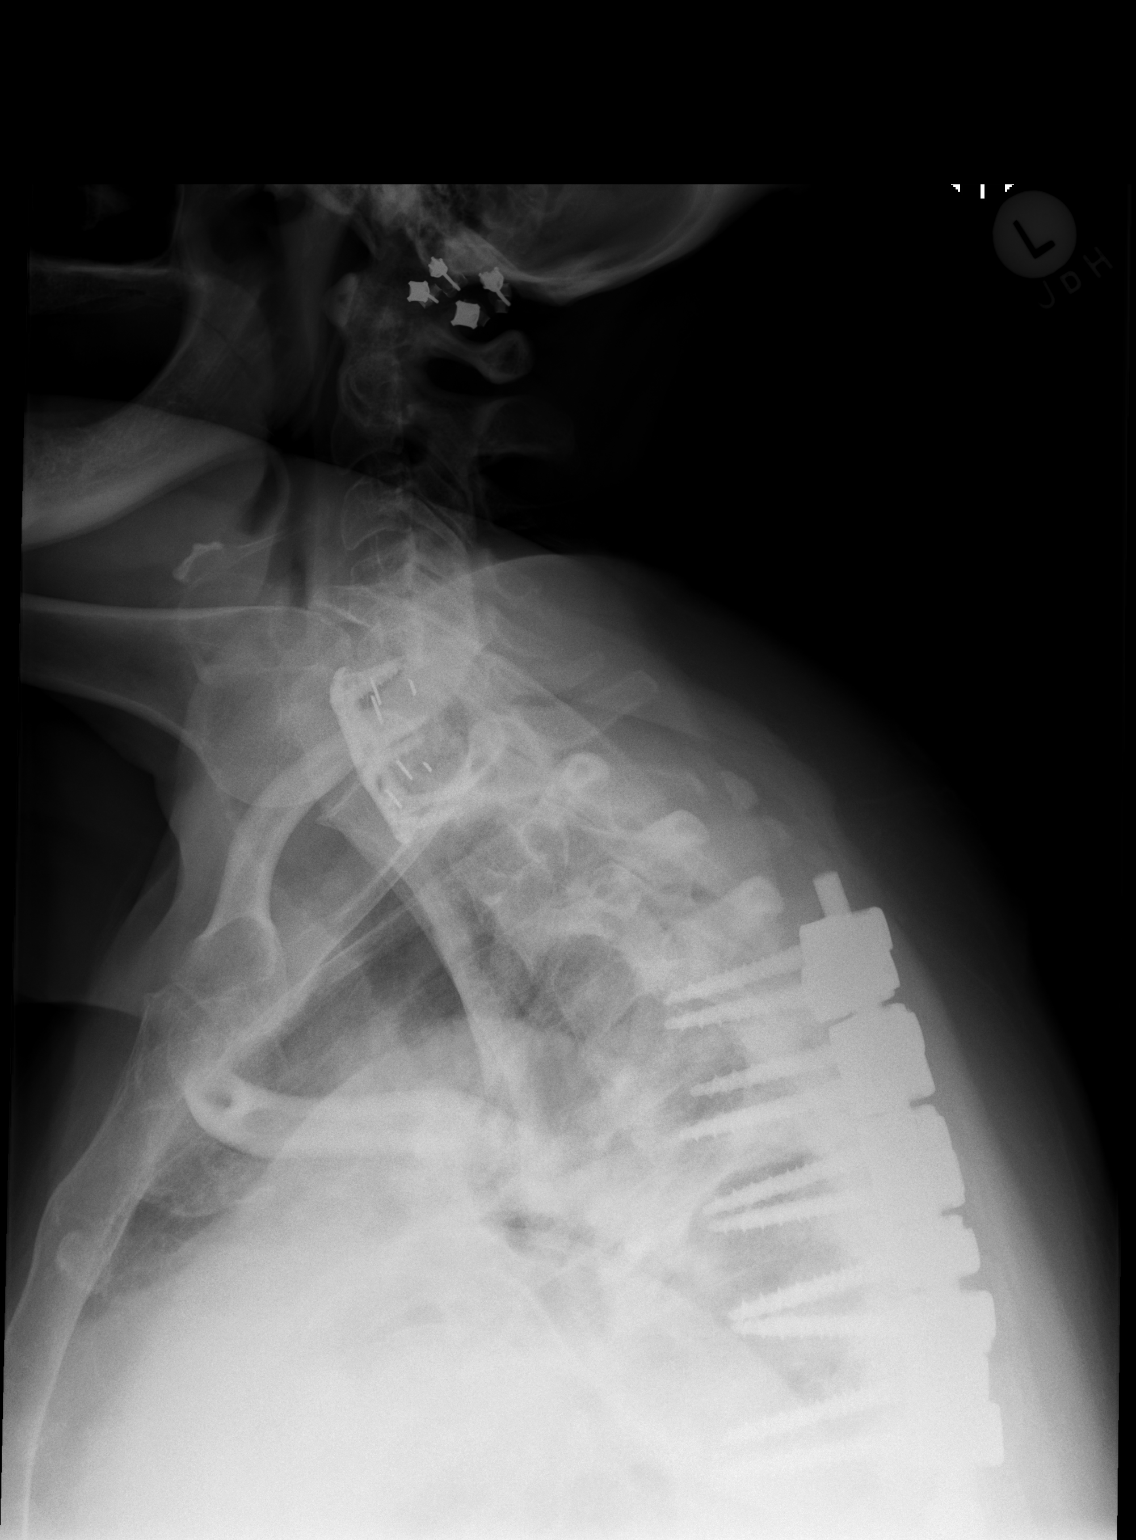

[3 of 3 positions shown; findings below may reference images not displayed]

FINDINGS: Extensive hardware is noted in the thoracic spine with multiple
pedicle screws and posterior fixation rods. A small rod fragment is
noted on the left at what appears to be T8 but is stable from the
prior exam. No acute hardware failure is noted. No compression
deformities are seen. No other focal abnormality is noted.
IMPRESSION: Postsurgical changes without acute abnormality.

## 2019-04-01 NOTE — Progress Notes (Signed)
Subjective: 53 year old female presents the office with concerns of a wound, infection of the right big toe.  She states that she noticed a callus on the area which did come off last week and then another piece of skin came off over the weekend she noticed increased redness coming from the area.  She has no pain and she has neuropathy.  She has been out of town and not able to come to the office. Denies any systemic complaints such as fevers, chills, nausea, vomiting. No acute changes since last appointment, and no other complaints at this time.   Objective: AAO x3, NAD DP/PT pulses palpable bilaterally, CRT less than 3 seconds Sensation decreased with Semmes Weinstein monofilament Deformity present to the right hallux and there is granular wound present on the plantar medial aspect of the hallux measuring approximately 1.3 x 0.9 cm without any probing, undermining or tunneling.  There is hyperkeratotic tissue on the periwound.  There is erythema to the hallux and one area of mild streaking on the first metatarsal distally.  There is no fluctuation crepitation.  There is no malodor. No pain with calf compression, swelling, warmth, erythema  Assessment: Ulceration, infection right hallux  Plan: -All treatment options discussed with the patient including all alternatives, risks, complications.  -X-rays obtained and reviewed.  Deformity present of the hallux there is no evidence of acute fracture or osteomyelitis.  Soft tissue emphysema. -Debride the wound today #312 with scalpel without any complications. -Prescribed Bactrim as well as mupirocin ointment.  Recommended offloading daily.  Recommended to go back into a surgical shoe that she has at home.  Monitor for any signs or symptoms of worsening infection recommend to the emergency room should there be any worsening. -Patient encouraged to call the office with any questions, concerns, change in symptoms.   Return in about 1 week (around  04/02/2019).  [name] DPM

## 2019-04-02 ENCOUNTER — Ambulatory Visit (INDEPENDENT_AMBULATORY_CARE_PROVIDER_SITE_OTHER): Payer: 59 | Admitting: Podiatry

## 2019-04-02 ENCOUNTER — Other Ambulatory Visit: Payer: Self-pay

## 2019-04-02 ENCOUNTER — Encounter: Payer: Self-pay | Admitting: Podiatry

## 2019-04-02 DIAGNOSIS — L03032 Cellulitis of left toe: Secondary | ICD-10-CM

## 2019-04-02 DIAGNOSIS — L02612 Cutaneous abscess of left foot: Secondary | ICD-10-CM

## 2019-04-02 DIAGNOSIS — L97511 Non-pressure chronic ulcer of other part of right foot limited to breakdown of skin: Secondary | ICD-10-CM

## 2019-04-02 MED ORDER — SULFAMETHOXAZOLE-TRIMETHOPRIM 800-160 MG PO TABS: 1.0000 | ORAL_TABLET | Freq: Two times a day (BID) | ORAL | 0 refills | Status: DC

## 2019-04-09 ENCOUNTER — Telehealth: Payer: Self-pay | Admitting: *Deleted

## 2019-04-09 NOTE — Telephone Encounter (Signed)
Pt states the back strap on the boot will not stay stuck.

## 2019-04-09 NOTE — Telephone Encounter (Signed)
Left message informing pt she can come by the office to pick up a replacement boot.

## 2019-04-11 ENCOUNTER — Other Ambulatory Visit: Payer: Self-pay | Admitting: Family Medicine

## 2019-04-11 NOTE — Progress Notes (Signed)
Subjective: 53 year old female presents the office today for follow-up evaluation of a wound to right big toe.  She states that she is doing much better and the booth as been helping.  She still on antibiotics for any side effects.  She has been changing the dressing daily.  She denies any increase in swelling or redness or any red streaks. Denies any systemic complaints such as fevers, chills, nausea, vomiting. No acute changes since last appointment, and no other complaints at this time.   Objective: AAO x3, NAD DP/PT pulses palpable bilaterally, CRT less than 3 seconds Ulceration present on the plantar medial aspect of the hallux which is smaller today measuring 1 x 0.6 cm and superficial any probing, undermining or tunneling.  The erythema is much improved although faint erythema still remains.  There is no fluctuation or crepitation.  There is no malodor.  No ascending cellulitis. No open lesions or pre-ulcerative lesions.  No pain with calf compression, swelling, warmth, erythema  Assessment: Right hallux ulceration with improvement in cellulitis   Plan: -All treatment options discussed with the patient including all alternatives, risks, complications.  -Debrided the wound today utilizing #312 with scalpel down to healthy, granular tissue.  Finish course of antibiotics.  Continue daily dressing changes and offloading at all times.  Monitor for any signs or symptoms of worsening infection in his go the ER should any occur. -Patient encouraged to call the office with any questions, concerns, change in symptoms.   [name] DPM

## 2019-04-16 ENCOUNTER — Ambulatory Visit (INDEPENDENT_AMBULATORY_CARE_PROVIDER_SITE_OTHER): Payer: 59 | Admitting: Podiatry

## 2019-04-16 ENCOUNTER — Other Ambulatory Visit: Payer: Self-pay

## 2019-04-16 ENCOUNTER — Encounter: Payer: Self-pay | Admitting: Podiatry

## 2019-04-16 DIAGNOSIS — M79671 Pain in right foot: Secondary | ICD-10-CM

## 2019-04-16 DIAGNOSIS — L02612 Cutaneous abscess of left foot: Secondary | ICD-10-CM

## 2019-04-16 DIAGNOSIS — L03032 Cellulitis of left toe: Secondary | ICD-10-CM | POA: Diagnosis not present

## 2019-04-16 DIAGNOSIS — L97511 Non-pressure chronic ulcer of other part of right foot limited to breakdown of skin: Secondary | ICD-10-CM | POA: Diagnosis not present

## 2019-04-19 NOTE — Progress Notes (Signed)
Subjective: 53 year old female presents the office today for follow-up evaluation of a wound to right big toe.  States she is doing better.  She did wrap her foot and she put the bandage on too tight and caused some irritation to the lateral aspect of the foot as well as the top of the foot.  She denies any drainage or pus coming from the wound and she is still been wearing the cam boot.  She denies any fevers, chills, nausea, vomiting.  No calf pain, chest pain, shortness of breath.  No other concerns.   Objective: AAO x3, NAD DP/PT pulses palpable bilaterally, CRT less than 3 seconds Ulceration present on the plantar medial aspect of the hallux which is smaller today and is superficial without any probing, undermining or tunneling.  The erythema is much improved although faint erythema still remains present this is more from the bandage today.  There is no warmth of the foot there is no other signs of infection.  There is superficial skin breakdown to the lateral aspect the foot of the fifth metatarsal head laterally as well as the dorsal aspect the foot..  There is no fluctuation or crepitation.  There is no malodor.  No ascending cellulitis. No open lesions or pre-ulcerative lesions.  No pain with calf compression, swelling, warmth, erythema  Assessment: Right hallux ulceration with improvement in cellulitis ; skin irritation  Plan: -All treatment options discussed with the patient including all alternatives, risks, complications.  -Debrided the wound today utilizing #312 with scalpel down to healthy, granular tissue.   Continue daily dressing changes and offloading at all times.  Monitor for any signs or symptoms of worsening infection in his go the ER should any occur. -Because only bandaging the toe would hold off on the bandage of the foot due to the irritated areas. -Patient encouraged to call the office with any questions, concerns, change in symptoms.   [name] DPM

## 2019-04-26 ENCOUNTER — Telehealth: Payer: Self-pay | Admitting: *Deleted

## 2019-04-26 ENCOUNTER — Telehealth: Payer: Self-pay | Admitting: Family Medicine

## 2019-04-26 ENCOUNTER — Other Ambulatory Visit: Payer: Self-pay | Admitting: Podiatry

## 2019-04-26 MED ORDER — SULFAMETHOXAZOLE-TRIMETHOPRIM 800-160 MG PO TABS: 1.0000 | ORAL_TABLET | Freq: Two times a day (BID) | ORAL | 0 refills | Status: DC

## 2019-04-26 NOTE — Telephone Encounter (Signed)
I sent bactrim to the pharmacy for her. Please have her come in tomorrow to be seen or go to the ER.

## 2019-04-26 NOTE — Telephone Encounter (Signed)
Left message for pt to call with more information concerning the blister or call for an appt.

## 2019-04-26 NOTE — Telephone Encounter (Signed)
Left message for pt to call with the symptoms she was having from the blister site.

## 2019-04-26 NOTE — Telephone Encounter (Signed)
Pt states the blister on the side of her foot is infected.

## 2019-04-26 NOTE — Telephone Encounter (Signed)
Pt called and states that the Amovig is working well for her and she has not had a migraine since starting the medication. She just wanted to let you know.

## 2019-04-27 ENCOUNTER — Other Ambulatory Visit: Payer: Self-pay | Admitting: Family Medicine

## 2019-04-27 NOTE — Telephone Encounter (Signed)
Left message informing pt the antibiotic had been called in 04/26/2019 after 5:39pm and to make an appt to be seen today.

## 2019-05-01 ENCOUNTER — Ambulatory Visit (INDEPENDENT_AMBULATORY_CARE_PROVIDER_SITE_OTHER): Payer: 59 | Admitting: Podiatry

## 2019-05-01 ENCOUNTER — Other Ambulatory Visit: Payer: Self-pay

## 2019-05-01 ENCOUNTER — Encounter: Payer: Self-pay | Admitting: Podiatry

## 2019-05-01 DIAGNOSIS — L02612 Cutaneous abscess of left foot: Secondary | ICD-10-CM

## 2019-05-01 DIAGNOSIS — L03032 Cellulitis of left toe: Secondary | ICD-10-CM | POA: Diagnosis not present

## 2019-05-01 DIAGNOSIS — L97511 Non-pressure chronic ulcer of other part of right foot limited to breakdown of skin: Secondary | ICD-10-CM | POA: Diagnosis not present

## 2019-05-01 DIAGNOSIS — G629 Polyneuropathy, unspecified: Secondary | ICD-10-CM

## 2019-05-01 DIAGNOSIS — L600 Ingrowing nail: Secondary | ICD-10-CM | POA: Diagnosis not present

## 2019-05-01 IMAGING — DX DG FOOT COMPLETE 3+V*L*
3 series · 3 of 3 positions shown · non-contrast
Comparison: 01/23/2018

CLINICAL DATA: Patient reports infection to the bone in the fourth
toe of the left foot spreading to the fifth toe. Streaking from the
wound. Nausea and vomiting for 2 days.

EXAM:
LEFT FOOT - COMPLETE 3+ VIEW

[foot ap]
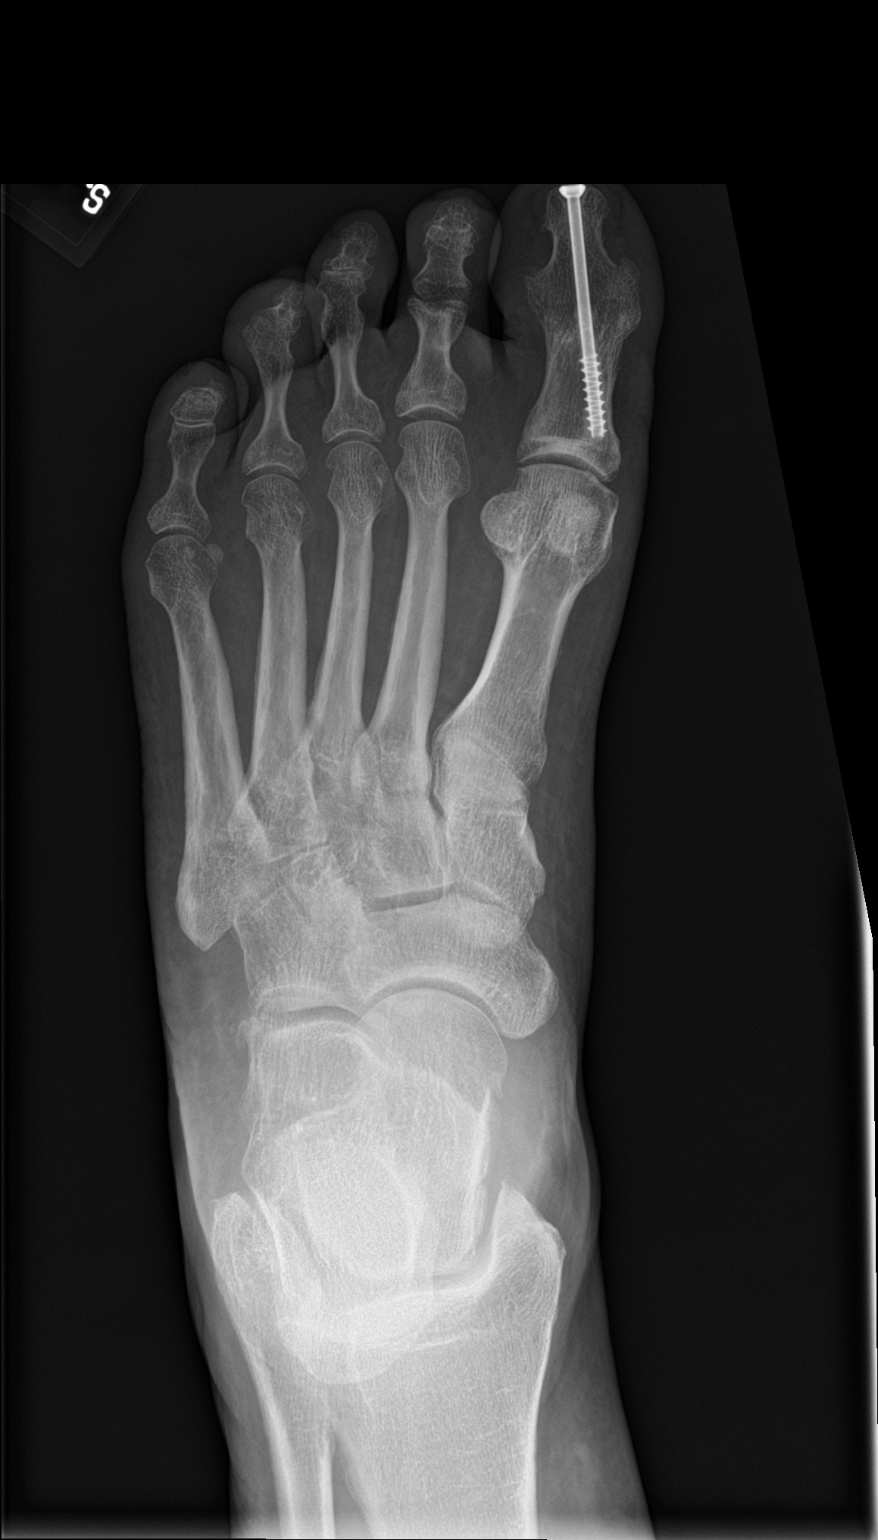

[foot obl]
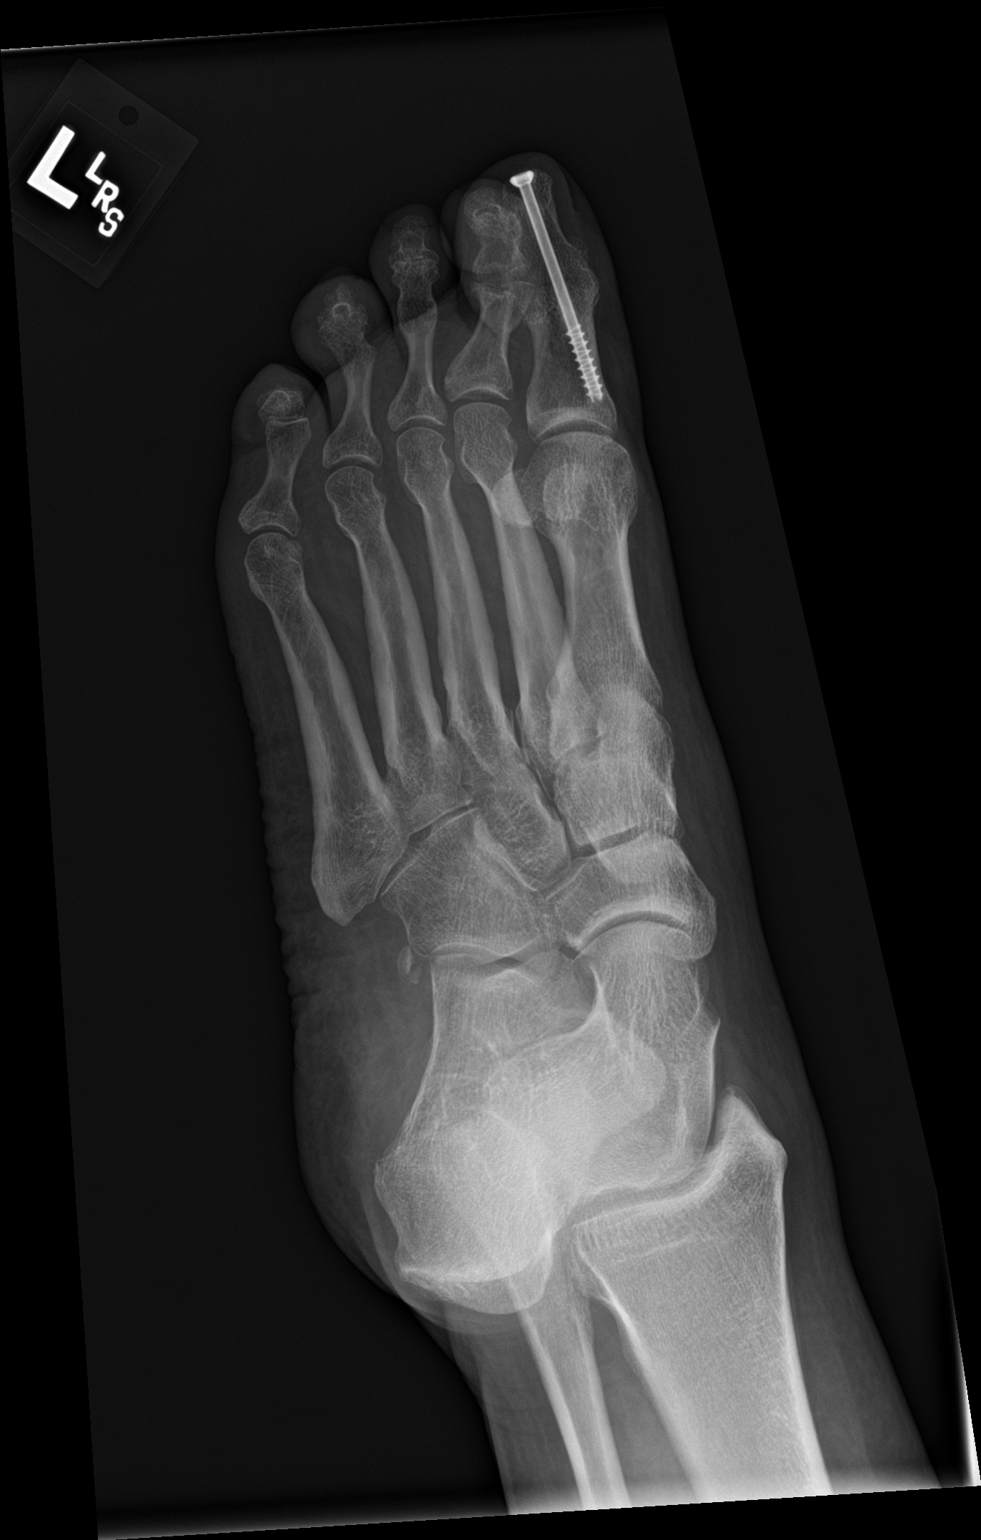

[foot lat]
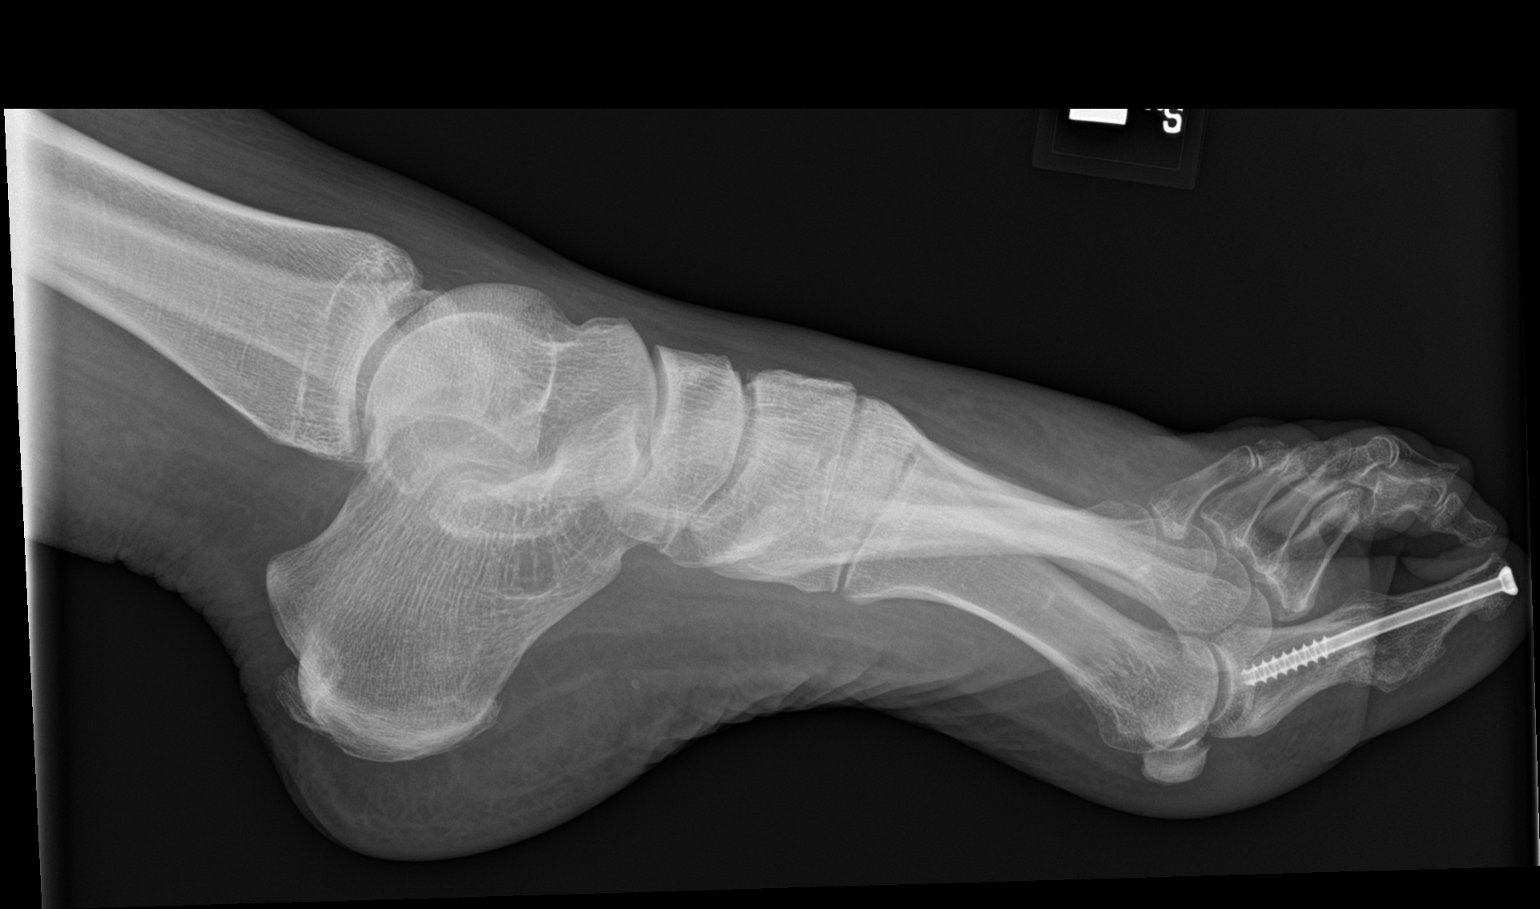

[3 of 3 positions shown; findings below may reference images not displayed]

FINDINGS: Old arthrodesis with screw fixation of the interphalangeal joint of
the first toe. Hardware appears intact. No evidence of acute
fracture or dislocation. Evaluation of the fourth and fifth toes is
limited due to the positioning but no obvious bone sclerosis or
cortical erosion is identified suggesting no evidence of significant
osteomyelitis. Soft tissue swelling is seen over the forefoot
region. Small calcaneal spurs. Mild degenerative changes in the
intertarsal joints.
IMPRESSION: Old postoperative changes in the first toe. Soft tissue swelling. No
evidence of osteomyelitis.

## 2019-05-01 MED ORDER — CLINDAMYCIN HCL 300 MG PO CAPS: 300.0000 mg | ORAL_CAPSULE | Freq: Three times a day (TID) | ORAL | 0 refills | Status: DC

## 2019-05-01 MED ORDER — PROMETHAZINE HCL 25 MG PO TABS: 25.0000 mg | ORAL_TABLET | Freq: Three times a day (TID) | ORAL | 0 refills | Status: DC | PRN

## 2019-05-01 MED ORDER — SILVER SULFADIAZINE 1 % EX CREA: 1.0000 "application " | TOPICAL_CREAM | Freq: Every day | CUTANEOUS | 0 refills | Status: DC

## 2019-05-01 NOTE — Progress Notes (Signed)
Subjective: 54 year old female presents the office today for follow-up evaluation of a wound to right big toe.  She states that she is having drainage in her big toe and also the area of the blister on the outside aspect of the right foot has gotten worse.  She has noticed some swelling redness to the forefoot on the area of the wound particularly lateral foot wound.  Overall she feels well she denies any systemic complaints including fevers, chills, nausea, vomiting.  Objective: AAO x3, NAD DP/PT pulses palpable bilaterally, CRT less than 3 seconds Ulceration present on the plantar medial aspect of the hallux which appears to be of the same size.  There is clear drainage coming from the lateral border hallux toenail as well as the proximal nail border there is localized edema and erythema to the nail borders.  No ascending cellulitis of this area.  However there is an ulceration present on the lateral aspect from fifth metatarsal head measuring possibly 2 x 2 cm and superficial and there is erythema around this wound.  There is increased swelling to the forefoot and mild warmth of the foot. No open lesions or pre-ulcerative lesions.  No pain with calf compression, swelling, warmth, erythema  Assessment: Right hallux ulceration with ingrown toenail/infection; bilateral foot ulceration with cellulitis  Plan: -All treatment options discussed with the patient including all alternatives, risks, complications.  -Given the drainage on the hallux toenail recommend removal.  Understanding risks and complications she wishes to proceed in consent was signed.  The toe was cleaned with alcohol and was anesthetized with 3 cc of lidocaine, Marcaine plain.  Once anesthetized the toenail was removed in toto.  A wound culture was obtained of the drainage.  Underlying skin intact no purulence identified.  Areas irrigated.  Silvadene was applied to this as well as the wound on the hallux.  Also prescribed  Silvadene. -The lateral aspect of the foot on the wound appears to be superficial and drying.  Recommend a small amount of Betadine daily. -Start clindamycin.  Stop Bactrim. -Monitor for any clinical signs or symptoms of infection and directed to call the office immediately should any occur or go to the ER.  Return in about 1 week (around 05/08/2019).  [name] DPM

## 2019-05-03 ENCOUNTER — Other Ambulatory Visit: Payer: Self-pay | Admitting: Cardiovascular Disease

## 2019-05-03 IMAGING — DX DG FOOT 2V*L*
2 series · 2 of 2 positions shown · non-contrast
Comparison: 01/31/2018

CLINICAL DATA: Post op check

EXAM:
LEFT FOOT - 2 VIEW

[foot ap]
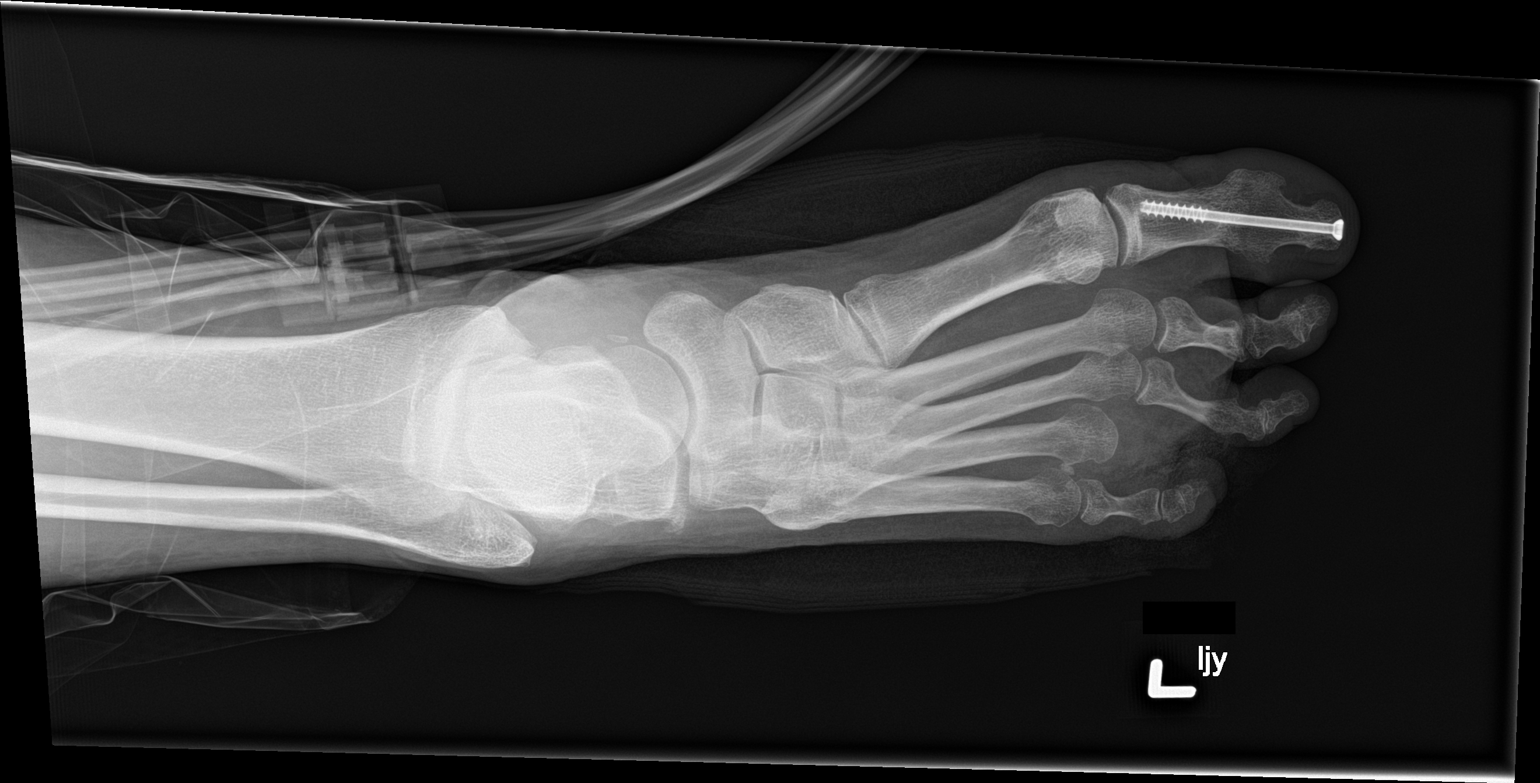

[foot lat]
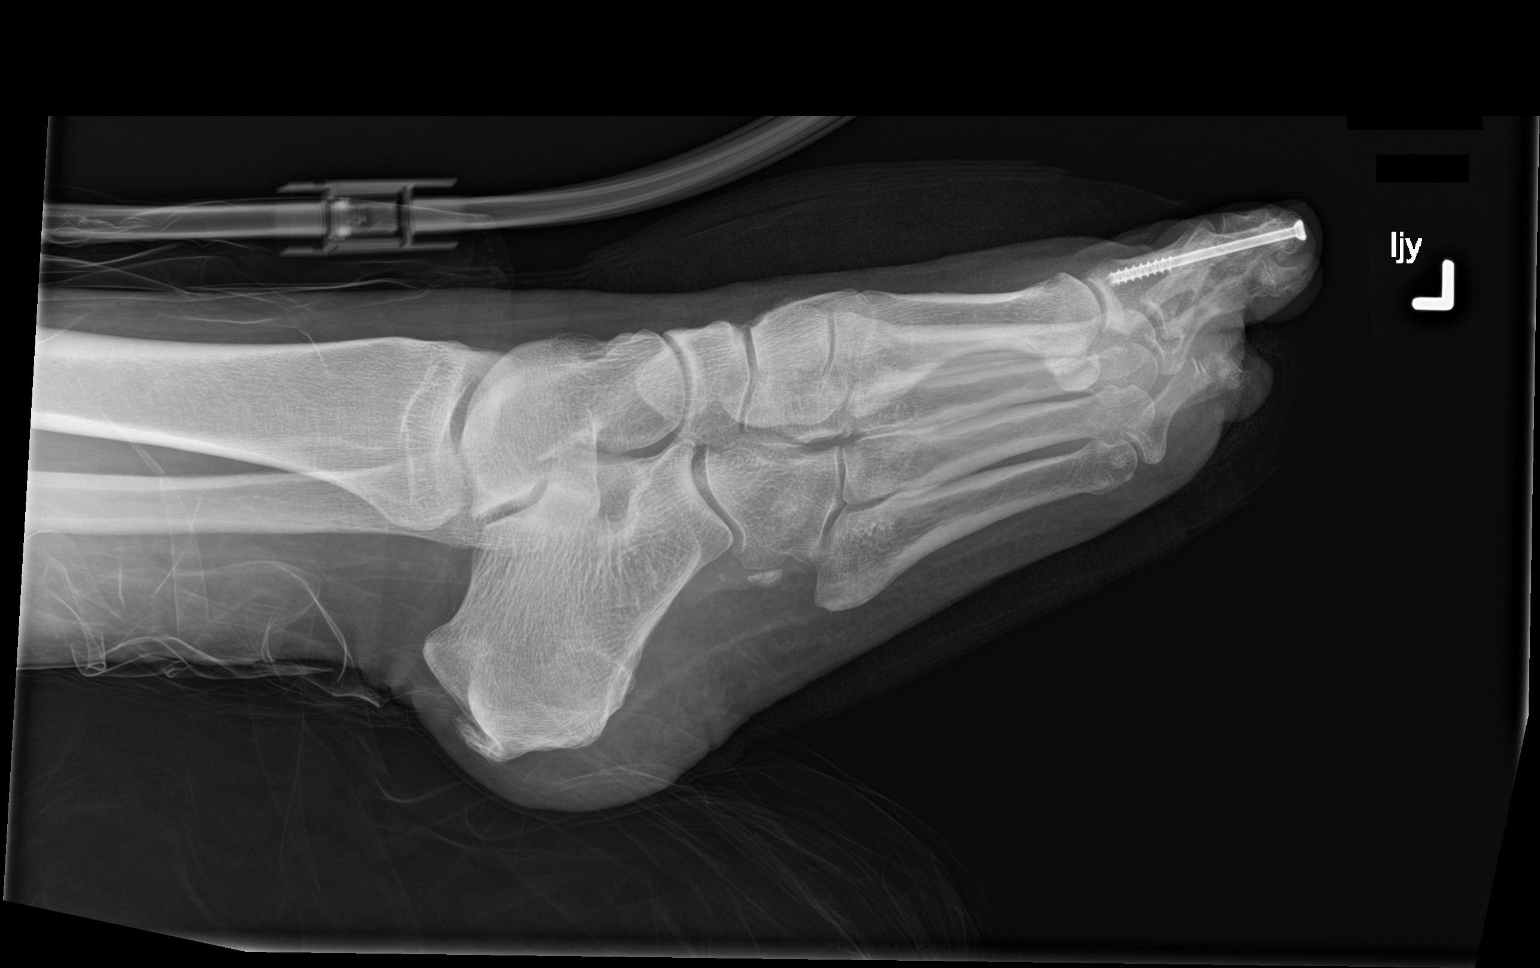

[2 of 2 positions shown; findings below may reference images not displayed]

FINDINGS: Single cannulated screw at LEFT great toe a across IP joint fusion
of the proximal distal phalanges.

Bones demineralized.

Interval amputation of the fourth toe with superimposed dressing
artifacts.

No acute fracture, dislocation, or bone destruction..
IMPRESSION: Interval amputation of the LEFT fourth toe.

Prior LEFT great toe IP joint fusion.

## 2019-05-04 LAB — WOUND CULTURE
MICRO NUMBER:: 10033644
SPECIMEN QUALITY:: ADEQUATE

## 2019-05-08 ENCOUNTER — Other Ambulatory Visit: Payer: Self-pay

## 2019-05-08 ENCOUNTER — Ambulatory Visit (INDEPENDENT_AMBULATORY_CARE_PROVIDER_SITE_OTHER): Payer: 59 | Admitting: Podiatry

## 2019-05-08 ENCOUNTER — Telehealth: Payer: Self-pay | Admitting: *Deleted

## 2019-05-08 DIAGNOSIS — L02612 Cutaneous abscess of left foot: Secondary | ICD-10-CM

## 2019-05-08 DIAGNOSIS — L03032 Cellulitis of left toe: Secondary | ICD-10-CM

## 2019-05-08 DIAGNOSIS — L97511 Non-pressure chronic ulcer of other part of right foot limited to breakdown of skin: Secondary | ICD-10-CM

## 2019-05-08 MED ORDER — SULFAMETHOXAZOLE-TRIMETHOPRIM 800-160 MG PO TABS: 1.0000 | ORAL_TABLET | Freq: Two times a day (BID) | ORAL | 0 refills | Status: DC

## 2019-05-08 MED ORDER — LEVOFLOXACIN 500 MG PO TABS: 500.0000 mg | ORAL_TABLET | Freq: Every day | ORAL | 0 refills | Status: DC

## 2019-05-08 NOTE — Patient Instructions (Signed)
Stop the clindamycin and the bactrim. Start the levofloxacin. If there are any issues please call me!  If you notice any increase in redness, drainage, swelling, fevers or any other signs of infection you need to go to the ER.

## 2019-05-08 NOTE — Telephone Encounter (Signed)
She is only to take the levaquin and not the other antibiotics.

## 2019-05-08 NOTE — Telephone Encounter (Signed)
Left message with Dr. Leigh Aurora orders of 05/04/2019 12:55pm.

## 2019-05-08 NOTE — Telephone Encounter (Signed)
-----   Message from [name], DPM sent at 05/04/2019 12:55 PM EST ----- Left VM to go back on the bactrim. If the clinda was helping the other part of the foot then I would recommend doing both.

## 2019-05-08 NOTE — Telephone Encounter (Signed)
[name] states pt has a prescription for septra DS and levaquin is she to be on both.

## 2019-05-09 NOTE — Addendum Note (Signed)
Addended by: Harriett Sine D on: 05/09/2019 10:38 AM   Modules accepted: Orders

## 2019-05-09 NOTE — Telephone Encounter (Addendum)
I informed [name], pt is to only take the Levaquin.

## 2019-05-11 ENCOUNTER — Other Ambulatory Visit: Payer: Self-pay | Admitting: Podiatry

## 2019-05-11 ENCOUNTER — Telehealth: Payer: Self-pay | Admitting: *Deleted

## 2019-05-11 MED ORDER — SULFAMETHOXAZOLE-TRIMETHOPRIM 800-160 MG PO TABS: 1.0000 | ORAL_TABLET | Freq: Two times a day (BID) | ORAL | 0 refills | Status: DC

## 2019-05-11 NOTE — Telephone Encounter (Signed)
I called the patient back. Due to side affects she is doing to stop the levaquin and go back only on the Bactrim. Sent refill. Other than feeling ill from the antibiotic denies any fevers. No red streaks. The area where the nail has been removed she is not sure how it is doing as it is turning colors. Will have her start to use silvadene which she states she has at home. If any worsening signs of infection she is to go to the ER.

## 2019-05-11 NOTE — Telephone Encounter (Signed)
Pt states the levaquin is making her sick like the clindamycin, does Dr. Jacqualyn Posey want her to continue with the levaquin and the nausea medicine.

## 2019-05-13 NOTE — Progress Notes (Signed)
Subjective: 54 year old female presents the office today for follow-up evaluation of wounds to her right foot.  She said swelling the redness is much improved but she is sick on the antibiotics.  She did not get my message time call on Friday would not change in antibiotics.  She denies any pus coming on her legs and no red streaks.  She still wearing a surgical shoe. Denies any systemic complaints including fevers, chills, nausea, vomiting.  Objective: AAO x3, NAD DP/PT pulses palpable bilaterally, CRT less than 3 seconds Ulceration present on the plantar medial aspect of the hallux which appears to be somewhat smaller.  Granular wound base is present on the hallux status post total nail removal.  No drainage or pus.  Some fibrotic tissue present.  There is no necrotic tissue.  Wound on the fifth metatarsal head laterally appears to be somewhat deeper but there is no probing to bone, undermining or tunneling.  Measures approximately 1.5 x 1 cm with some fibrotic tissue present.  Erythema on the area is much improved.  There is decreased edema to the left foot.  Minimal left foot. Multiple digital deformities present.   No open lesions or pre-ulcerative lesions.  No pain with calf compression, swelling, warmth, erythema  Assessment: Ulcerations right foot with decreasing cellulitis  Plan: -All treatment options discussed with the patient including all alternatives, risks, complications.  -Plan switch antibiotics to Levaquin.  Discussed side effects of medication and should she have any issues to let me know.  If she does elect to switch to Bactrim again. -Debrided the ulcerations today without any complications with a [id number] with a granular tissue.  Continue daily dressing changes.  She is using medihoney.  -Discussed that she is at high risk of amputation.  Also discussed possible fifth metatarsal head resection.  For now continue offloading dressing off as much as possible.  Fortunately she has  been on her feet quite a bit although since last appointment she has tried to stay off of her feet more.   Return in about 1 week (around 05/15/2019).  [name] DPM

## 2019-05-15 ENCOUNTER — Encounter: Payer: Self-pay | Admitting: Podiatry

## 2019-05-15 ENCOUNTER — Other Ambulatory Visit: Payer: Self-pay

## 2019-05-15 ENCOUNTER — Ambulatory Visit (INDEPENDENT_AMBULATORY_CARE_PROVIDER_SITE_OTHER): Payer: 59

## 2019-05-15 ENCOUNTER — Encounter (HOSPITAL_COMMUNITY): Payer: Self-pay | Admitting: Emergency Medicine

## 2019-05-15 ENCOUNTER — Inpatient Hospital Stay (HOSPITAL_COMMUNITY)
Admission: EM | Admit: 2019-05-15 | Discharge: 2019-05-21 | DRG: 475 | Disposition: A | Discharge status: Home Health | Payer: 59 | Attending: Internal Medicine | Admitting: Internal Medicine

## 2019-05-15 ENCOUNTER — Ambulatory Visit (INDEPENDENT_AMBULATORY_CARE_PROVIDER_SITE_OTHER): Payer: 59 | Admitting: Podiatry

## 2019-05-15 VITALS — BP 146/91 | HR 113 | Temp 96.1°F

## 2019-05-15 DIAGNOSIS — M2030 Hallux varus (acquired), unspecified foot: Secondary | ICD-10-CM | POA: Diagnosis present

## 2019-05-15 DIAGNOSIS — K219 Gastro-esophageal reflux disease without esophagitis: Secondary | ICD-10-CM | POA: Diagnosis present

## 2019-05-15 DIAGNOSIS — M869 Osteomyelitis, unspecified: Secondary | ICD-10-CM

## 2019-05-15 DIAGNOSIS — M961 Postlaminectomy syndrome, not elsewhere classified: Secondary | ICD-10-CM | POA: Diagnosis present

## 2019-05-15 DIAGNOSIS — L97514 Non-pressure chronic ulcer of other part of right foot with necrosis of bone: Secondary | ICD-10-CM | POA: Diagnosis not present

## 2019-05-15 DIAGNOSIS — L03032 Cellulitis of left toe: Secondary | ICD-10-CM | POA: Diagnosis not present

## 2019-05-15 DIAGNOSIS — L97509 Non-pressure chronic ulcer of other part of unspecified foot with unspecified severity: Secondary | ICD-10-CM | POA: Diagnosis present

## 2019-05-15 DIAGNOSIS — L03116 Cellulitis of left lower limb: Secondary | ICD-10-CM | POA: Diagnosis not present

## 2019-05-15 DIAGNOSIS — M86171 Other acute osteomyelitis, right ankle and foot: Secondary | ICD-10-CM | POA: Diagnosis present

## 2019-05-15 DIAGNOSIS — Z09 Encounter for follow-up examination after completed treatment for conditions other than malignant neoplasm: Secondary | ICD-10-CM

## 2019-05-15 DIAGNOSIS — J45909 Unspecified asthma, uncomplicated: Secondary | ICD-10-CM | POA: Diagnosis present

## 2019-05-15 DIAGNOSIS — G894 Chronic pain syndrome: Secondary | ICD-10-CM | POA: Diagnosis present

## 2019-05-15 DIAGNOSIS — L97512 Non-pressure chronic ulcer of other part of right foot with fat layer exposed: Secondary | ICD-10-CM | POA: Diagnosis not present

## 2019-05-15 DIAGNOSIS — G629 Polyneuropathy, unspecified: Secondary | ICD-10-CM | POA: Diagnosis present

## 2019-05-15 DIAGNOSIS — L97511 Non-pressure chronic ulcer of other part of right foot limited to breakdown of skin: Secondary | ICD-10-CM | POA: Diagnosis not present

## 2019-05-15 DIAGNOSIS — Z20822 Contact with and (suspected) exposure to covid-19: Secondary | ICD-10-CM | POA: Diagnosis present

## 2019-05-15 DIAGNOSIS — Z833 Family history of diabetes mellitus: Secondary | ICD-10-CM | POA: Diagnosis not present

## 2019-05-15 DIAGNOSIS — F319 Bipolar disorder, unspecified: Secondary | ICD-10-CM | POA: Diagnosis present

## 2019-05-15 DIAGNOSIS — L03115 Cellulitis of right lower limb: Secondary | ICD-10-CM | POA: Diagnosis present

## 2019-05-15 DIAGNOSIS — F3110 Bipolar disorder, current episode manic without psychotic features, unspecified: Secondary | ICD-10-CM | POA: Diagnosis not present

## 2019-05-15 DIAGNOSIS — Z79899 Other long term (current) drug therapy: Secondary | ICD-10-CM

## 2019-05-15 DIAGNOSIS — L97519 Non-pressure chronic ulcer of other part of right foot with unspecified severity: Secondary | ICD-10-CM | POA: Diagnosis present

## 2019-05-15 DIAGNOSIS — R739 Hyperglycemia, unspecified: Secondary | ICD-10-CM

## 2019-05-15 DIAGNOSIS — R569 Unspecified convulsions: Secondary | ICD-10-CM | POA: Diagnosis present

## 2019-05-15 DIAGNOSIS — I10 Essential (primary) hypertension: Secondary | ICD-10-CM | POA: Diagnosis present

## 2019-05-15 DIAGNOSIS — F419 Anxiety disorder, unspecified: Secondary | ICD-10-CM | POA: Diagnosis present

## 2019-05-15 DIAGNOSIS — L02612 Cutaneous abscess of left foot: Secondary | ICD-10-CM

## 2019-05-15 DIAGNOSIS — G43909 Migraine, unspecified, not intractable, without status migrainosus: Secondary | ICD-10-CM | POA: Diagnosis present

## 2019-05-15 DIAGNOSIS — M86271 Subacute osteomyelitis, right ankle and foot: Secondary | ICD-10-CM | POA: Diagnosis not present

## 2019-05-15 DIAGNOSIS — R7303 Prediabetes: Secondary | ICD-10-CM | POA: Diagnosis present

## 2019-05-15 DIAGNOSIS — G43709 Chronic migraine without aura, not intractable, without status migrainosus: Secondary | ICD-10-CM | POA: Diagnosis not present

## 2019-05-15 DIAGNOSIS — L03031 Cellulitis of right toe: Secondary | ICD-10-CM | POA: Diagnosis present

## 2019-05-15 DIAGNOSIS — Z8249 Family history of ischemic heart disease and other diseases of the circulatory system: Secondary | ICD-10-CM

## 2019-05-15 DIAGNOSIS — L02415 Cutaneous abscess of right lower limb: Secondary | ICD-10-CM | POA: Diagnosis not present

## 2019-05-15 DIAGNOSIS — L0390 Cellulitis, unspecified: Secondary | ICD-10-CM | POA: Diagnosis present

## 2019-05-15 DIAGNOSIS — G4733 Obstructive sleep apnea (adult) (pediatric): Secondary | ICD-10-CM

## 2019-05-15 DIAGNOSIS — S91109A Unspecified open wound of unspecified toe(s) without damage to nail, initial encounter: Secondary | ICD-10-CM

## 2019-05-15 DIAGNOSIS — M48061 Spinal stenosis, lumbar region without neurogenic claudication: Secondary | ICD-10-CM | POA: Diagnosis present

## 2019-05-15 DIAGNOSIS — M2031 Hallux varus (acquired), right foot: Secondary | ICD-10-CM | POA: Diagnosis not present

## 2019-05-15 DIAGNOSIS — G4730 Sleep apnea, unspecified: Secondary | ICD-10-CM | POA: Diagnosis present

## 2019-05-15 LAB — BASIC METABOLIC PANEL
Anion gap: 8 (ref 5–15)
BUN: 9 mg/dL (ref 6–20)
CO2: 25 mmol/L (ref 22–32)
Calcium: 9.4 mg/dL (ref 8.9–10.3)
Chloride: 104 mmol/L (ref 98–111)
Creatinine, Ser: 0.71 mg/dL (ref 0.44–1.00)
GFR calc Af Amer: >60 mL/min (ref >60)
GFR calc non Af Amer: >60 mL/min (ref >60)
Glucose, Bld: 90 mg/dL (ref 70–99)
Potassium: 3.7 mmol/L (ref 3.5–5.1)
Sodium: 137 mmol/L (ref 135–145)

## 2019-05-15 LAB — CBC
HCT: 35.2 % — ABNORMAL LOW (ref 36.0–46.0)
HCT: 32.9 % — ABNORMAL LOW (ref 36.0–46.0)
Hemoglobin: 11.4 g/dL — ABNORMAL LOW (ref 12.0–15.0)
Hemoglobin: 10.4 g/dL — ABNORMAL LOW (ref 12.0–15.0)
MCH: 32 pg (ref 26.0–34.0)
MCH: 31.4 pg (ref 26.0–34.0)
MCHC: 32.4 g/dL (ref 30.0–36.0)
MCHC: 31.6 g/dL (ref 30.0–36.0)
MCV: 98.9 fL (ref 80.0–100.0)
MCV: 99.4 fL (ref 80.0–100.0)
Platelets: 310 10*3/uL (ref 150–400)
Platelets: 327 10*3/uL (ref 150–400)
RBC: 3.56 MIL/uL — ABNORMAL LOW (ref 3.87–5.11)
RBC: 3.31 MIL/uL — ABNORMAL LOW (ref 3.87–5.11)
RDW: 12.6 % (ref 11.5–15.5)
RDW: 12.5 % (ref 11.5–15.5)
WBC: 7.2 10*3/uL (ref 4.0–10.5)
WBC: 6.7 10*3/uL (ref 4.0–10.5)
nRBC: 0 % (ref 0.0–0.2)
nRBC: 0 % (ref 0.0–0.2)

## 2019-05-15 LAB — HEMOGLOBIN A1C
Hgb A1c MFr Bld: 5.2 % (ref 4.8–5.6)
Mean Plasma Glucose: 102.54 mg/dL

## 2019-05-15 LAB — HIV ANTIBODY (ROUTINE TESTING W REFLEX): HIV Screen 4th Generation wRfx: NONREACTIVE

## 2019-05-15 LAB — GLUCOSE, CAPILLARY
Glucose-Capillary: 72 mg/dL (ref 70–99)
Glucose-Capillary: 100 mg/dL — ABNORMAL HIGH (ref 70–99)

## 2019-05-15 LAB — SARS CORONAVIRUS 2 (TAT 6-24 HRS): SARS Coronavirus 2: NEGATIVE

## 2019-05-15 MED ORDER — DULOXETINE HCL 60 MG PO CPEP
60.0000 mg | ORAL_CAPSULE | Freq: Every day | ORAL | Status: DC
  Administered 2019-05-15 – 2019-05-20 (×6): 60 mg via ORAL
  Filled 2019-05-15: qty 2
  Filled 2019-05-15 (×2): qty 1
  Filled 2019-05-15: qty 2
  Filled 2019-05-15: qty 1
  Filled 2019-05-15: qty 2

## 2019-05-15 MED ORDER — HALOPERIDOL 5 MG PO TABS
5.0000 mg | ORAL_TABLET | Freq: Every day | ORAL | Status: DC
  Administered 2019-05-15 – 2019-05-20 (×6): 5 mg via ORAL
  Filled 2019-05-15 (×7): qty 1

## 2019-05-15 MED ORDER — VANCOMYCIN HCL IN DEXTROSE 1-5 GM/200ML-% IV SOLN
1000.0000 mg | Freq: Once | INTRAVENOUS | Status: AC
  Administered 2019-05-15: 1000 mg via INTRAVENOUS
  Filled 2019-05-15: qty 200

## 2019-05-15 MED ORDER — GABAPENTIN 300 MG PO CAPS
600.0000 mg | ORAL_CAPSULE | Freq: Three times a day (TID) | ORAL | Status: DC
  Administered 2019-05-15 – 2019-05-21 (×16): 600 mg via ORAL
  Filled 2019-05-15 (×16): qty 2

## 2019-05-15 MED ORDER — INSULIN ASPART 100 UNIT/ML ~~LOC~~ SOLN
0.0000 [IU] | Freq: Three times a day (TID) | SUBCUTANEOUS | Status: DC
  Administered 2019-05-18: 3 [IU] via SUBCUTANEOUS

## 2019-05-15 MED ORDER — MORPHINE SULFATE ER 30 MG PO TBCR
30.0000 mg | EXTENDED_RELEASE_TABLET | Freq: Two times a day (BID) | ORAL | Status: DC
  Administered 2019-05-15 – 2019-05-21 (×12): 30 mg via ORAL
  Filled 2019-05-15 (×12): qty 1

## 2019-05-15 MED ORDER — METOPROLOL SUCCINATE ER 25 MG PO TB24
25.0000 mg | ORAL_TABLET | Freq: Every day | ORAL | Status: DC
  Administered 2019-05-16 – 2019-05-21 (×6): 25 mg via ORAL
  Filled 2019-05-15 (×6): qty 1

## 2019-05-15 MED ORDER — ENOXAPARIN SODIUM 40 MG/0.4ML ~~LOC~~ SOLN
40.0000 mg | SUBCUTANEOUS | Status: DC
  Administered 2019-05-15 – 2019-05-20 (×6): 40 mg via SUBCUTANEOUS
  Filled 2019-05-15 (×6): qty 0.4

## 2019-05-15 MED ORDER — ESCITALOPRAM OXALATE 10 MG PO TABS
5.0000 mg | ORAL_TABLET | Freq: Every day | ORAL | Status: DC
  Administered 2019-05-16 – 2019-05-21 (×5): 5 mg via ORAL
  Filled 2019-05-15 (×5): qty 1

## 2019-05-15 MED ORDER — LACTATED RINGERS IV SOLN: INTRAVENOUS | Status: DC

## 2019-05-15 MED ORDER — ACETAMINOPHEN 650 MG RE SUPP: 650.0000 mg | Freq: Four times a day (QID) | RECTAL | Status: DC | PRN

## 2019-05-15 MED ORDER — SUMATRIPTAN SUCCINATE 50 MG PO TABS: 50.0000 mg | ORAL_TABLET | ORAL | Status: DC | PRN

## 2019-05-15 MED ORDER — BUSPIRONE HCL 5 MG PO TABS
15.0000 mg | ORAL_TABLET | Freq: Three times a day (TID) | ORAL | Status: DC
  Administered 2019-05-15 – 2019-05-21 (×16): 15 mg via ORAL
  Filled 2019-05-15: qty 1.5
  Filled 2019-05-15 (×4): qty 3
  Filled 2019-05-15: qty 1.5
  Filled 2019-05-15: qty 3
  Filled 2019-05-15: qty 1.5
  Filled 2019-05-15 (×3): qty 3
  Filled 2019-05-15 (×3): qty 1.5
  Filled 2019-05-15 (×3): qty 3
  Filled 2019-05-15 (×2): qty 1.5
  Filled 2019-05-15 (×5): qty 3

## 2019-05-15 MED ORDER — VANCOMYCIN HCL 750 MG/150ML IV SOLN
750.0000 mg | Freq: Two times a day (BID) | INTRAVENOUS | Status: DC
  Administered 2019-05-16 – 2019-05-19 (×7): 750 mg via INTRAVENOUS
  Filled 2019-05-15 (×7): qty 150

## 2019-05-15 MED ORDER — FENTANYL CITRATE (PF) 100 MCG/2ML IJ SOLN
25.0000 ug | Freq: Four times a day (QID) | INTRAMUSCULAR | Status: DC | PRN
  Administered 2019-05-16: 25 ug via INTRAVENOUS
  Filled 2019-05-15 (×2): qty 2

## 2019-05-15 MED ORDER — TRAZODONE HCL 100 MG PO TABS
100.0000 mg | ORAL_TABLET | Freq: Every day | ORAL | Status: DC
  Administered 2019-05-15 – 2019-05-20 (×6): 100 mg via ORAL
  Filled 2019-05-15 (×6): qty 1

## 2019-05-15 MED ORDER — ACETAMINOPHEN 325 MG PO TABS
650.0000 mg | ORAL_TABLET | Freq: Four times a day (QID) | ORAL | Status: DC | PRN
  Administered 2019-05-17 – 2019-05-20 (×4): 650 mg via ORAL
  Filled 2019-05-15 (×3): qty 2

## 2019-05-15 NOTE — ED Provider Notes (Signed)
[facility] DEPT Provider Note   CSN: [id number] Arrival date & time: 05/15/19  1315     History Chief Complaint  Patient presents with  . Foot Pain    rt    [name] [name] is a 54 y.o. female.  HPI  54 yo female history of neuropathy and subsequent foot infections,presents with right foot infection began on right foot right great toe, treated by Dr. Jacqualyn Posey, [facility], bactrim ,clindamycin, and levaquin and continues to worsen.  Patient seen in office today due to worsening symptoms since Sunday.  She was instructed to come to ED for further evaluation and treatment. Discussed with Dr. Jacqualyn Posey, patient grew staph out of culture 1/12.  Will give vanc      Past Medical History:  Diagnosis Date  . Anxiety   . Asthma    mild intermittent  . Bipolar 1 disorder ([place])    ect treatments last treatment Sep 02 1011  . Depression   . GERD (gastroesophageal reflux disease)   . Hypertension   . Pre-diabetes   . Seizures ([place])    last seizure was so long ago she can't remember.   . Sleep apnea    wears CPAP, uncertain of setting    Patient Active Problem List   Diagnosis Date Noted  . Palpitations 10/26/2018  . Foot ulcer ([place]) 01/31/2018  . Hyperglycemia 01/31/2018  . Left leg cellulitis 09/24/2017  . Cellulitis 09/24/2017  . Syncope and collapse   . Near syncope 11/23/2016  . Abdominal pain 09/23/2016  . Anxiety 09/23/2016  . 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[place]) 09/23/2016  . Constipation 09/23/2016  . DDD (degenerative disc disease), lumbosacral 09/23/2016  . Migraines 09/23/2016  . Myelopathy ([place]) 09/23/2016  . Nausea 09/23/2016  . Post laminectomy syndrome 09/23/2016  . Pseudoarthrosis of lumbar spine 09/23/2016  . Seizures ([place]) 09/23/2016  . Shortness of breath 09/23/2016  . Sleep apnea 09/23/2016  . Spinal stenosis of lumbar region 09/23/2016  . Vertigo 09/23/2016  . 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 Chronic pain  syndrome   . Esophageal reflux   . Depression   . Rash 11/14/2015  . Toe ulcer, right ([place]) 04/11/2015  . Cellulitis of toe of right foot 04/11/2015  . Nonspecific chest pain   . Chest pain 10/26/2014  . Obesity (BMI 30-39.9) 10/26/2014  . Hypokalemia 10/26/2014  . Manic bipolar I disorder ([place]) 10/26/2014  . Atypical angina ([place]) 10/26/2014  . Tardive dyskinesia 08/26/2011  . Manic bipolar I disorder with rapid cycling ([place]) 05/18/2011    Class: Acute  . Asthma 02/24/2011  . Essential hypertension 02/24/2011  . History of migraine headaches 02/24/2011    Past Surgical History:  Procedure Laterality Date  . ABDOMINAL HYSTERECTOMY    . AMPUTATION TOE Left 02/02/2018   Procedure: AMPUTATION TOE Left 4th toe;  Surgeon: [name], DPM;  Location: [facility];  Service: Podiatry;  Laterality: Left;  . BACK SURGERY    . CARPAL TUNNEL RELEASE     x2  . Laproscopic knee surgery    . NECK SURGERY  03/15/2017  . PLANTAR FASCIA RELEASE     x2     OB History    Gravida  3   Para  3   Term  3   Preterm      AB      Living  3     SAB      TAB      Ectopic  Multiple      Live Births  3           Family History  Problem Relation Age of Onset  . Diabetes Mother   . COPD Mother   . Hypertension Mother   . Hyperlipidemia Mother   . Heart disease Father   . Hyperlipidemia Father   . Hypertension Father   . Cancer Father   . Heart disease Brother   . Heart disease Daughter   . Heart disease Maternal Grandmother   . Hypertension Maternal Grandmother   . Heart disease Maternal Grandfather   . Kidney cancer Paternal Grandmother   . Heart disease Paternal Grandfather     Social History   Tobacco Use  . Smoking status: Never Smoker  . Smokeless tobacco: Never Used  Substance Use Topics  . Alcohol use: No  . Drug use: No    Home Medications Prior to Admission medications   Medication Sig Start Date End Date Taking? Authorizing Provider    busPIRone (BUSPAR) 15 MG tablet Take 15 mg by mouth 3 (three) times daily.  04/26/17   [provider]  Calcium Carbonate-Vit D-Min (CALCIUM 1200 PO) Take 1 tablet by mouth daily.    [provider]  clindamycin (CLEOCIN) 300 MG capsule Take 1 capsule (300 mg total) by mouth 3 (three) times daily. 05/01/19   [name], DPM  cyclobenzaprine (FLEXERIL) 10 MG tablet Take 10 mg by mouth 2 (two) times daily as needed for muscle spasms.     [provider]  Dextran 70-Hypromellose, PF, (ARTIFICIAL TEARS PF) 0.1-0.3 % SOLN Place 1-2 drops into both eyes 3 (three) times daily as needed (for dry eyes).    [provider]  DULoxetine (CYMBALTA) 60 MG capsule TAKE 1 CAPSULE BY MOUTH EVERY DAY 04/11/19   [name], MD  Erenumab-aooe (AIMOVIG) 70 MG/ML SOAJ Inject 70 mg into the skin every 30 (thirty) days. 02/12/19   [name], MD  escitalopram (LEXAPRO) 5 MG tablet Take 5 mg by mouth every morning. 04/30/19   [provider]  estradiol (ESTRACE) 1 MG tablet TAKE 1 TABLET BY MOUTH EVERY DAY 09/14/18   [name], MD  fluticasone [facility]) 50 MCG/ACT nasal spray SPRAY 2 SPRAYS INTO EACH NOSTRIL EVERY DAY 12/11/18   [name], MD  gabapentin (NEURONTIN) 600 MG tablet Take 600 mg by mouth 4 (four) times daily.    [provider]  haloperidol (HALDOL) 5 MG tablet Take 1 tablet (5 mg total) by mouth at bedtime. For psychosis 11/15/15   Barton Dubois, MD  levocetirizine (XYZAL) 5 MG tablet TAKE 1 TABLET BY MOUTH EVERY DAY IN THE EVENING 09/25/18   [name], MD  levofloxacin (LEVAQUIN) 500 MG tablet Take 1 tablet (500 mg total) by mouth daily. 05/08/19   [name], DPM  lidocaine (XYLOCAINE) 5 % ointment Apply 1-2 grams (1 gram = 1 inch) to the affected area 2-3 times daily for 30 days. 12/28/17   [provider]  linaclotide Rolan Lipa) 290 MCG CAPS capsule TAKE 1 CAPSULE EVERY MORNING BEFORE BREAKFAST 12/18/18    [name], MD  LINZESS 290 MCG CAPS capsule TAKE 1 CAPSULE BY MOUTH EVERY DAY IN THE MORNING BEFORE BREAKFAST 12/18/18   [name], MD  meloxicam (MOBIC) 15 MG tablet Take 15 mg by mouth daily. 09/15/17   [provider]  metoprolol succinate (TOPROL-XL) 25 MG 24 hr tablet Take 1 tablet (25 mg total)  by mouth daily. 05/03/19   [name], MD  morphine (MS CONTIN) 30 MG 12 hr tablet Take 30 mg by mouth every 8 (eight) hours.  05/01/18   [provider]  Multiple Vitamins-Minerals (MULTIVITAMIN WITH MINERALS) tablet Take 1 tablet by mouth daily.    [provider]  mupirocin ointment (BACTROBAN) 2 % Apply 1 application topically 2 (two) times daily. 03/26/19   [name], DPM  NARCAN 4 MG/0.1ML LIQD nasal spray kit USE 1 SPRAY IN ONE NOSTRIL AS NEEDED 12/19/18   [provider]  NEOMYCIN-POLYMYXIN-HYDROCORTISONE (CORTISPORIN) 1 % SOLN OTIC solution neomycin-polymyxin-hydrocort 3.5 mg/mL-10,000 unit/mL-1 % ear solution  PLACE FOUR DROPS BOTH EARS THREE TIMES DAILY    [provider]  nystatin (MYCOSTATIN/NYSTOP) powder APPLY TO AFFECTED AREA 4 TIMES A DAY 02/23/19   [name], MD  pantoprazole (PROTONIX) 40 MG tablet TAKE 1 TABLET BY MOUTH TWICE A DAY 04/27/19   [name], MD  potassium chloride SA (KLOR-CON M20) 20 MEQ tablet TAKE TWO TABLETS (40 MEQ) BY MOUTH EVERY DAY 02/08/19   [name], MD  predniSONE (STERAPRED UNI-PAK 21 TAB) 5 MG (21) TBPK tablet See admin instructions. 11/28/18   [provider]  PROAIR HFA 108 (90 Base) MCG/ACT inhaler Inhale 2 puffs = 160mg into the lungs every 6 (six) hours as needed for wheezing or shortness of breath. 01/10/18   PSusy Frizzle MD  promethazine (PHENERGAN) 25 MG tablet Take 1 tablet (25 mg total) by mouth every 8 (eight) hours as needed for nausea or vomiting. 05/23/18   PSusy Frizzle MD  promethazine (PHENERGAN) 25 MG tablet Take 1 tablet (25 mg  total) by mouth every 8 (eight) hours as needed for nausea or vomiting. 05/01/19   WTrula Slade DPM  silver sulfADIAZINE (SILVADENE) 1 % cream Apply 1 application topically daily. 05/01/19   WTrula Slade DPM  sodium fluoride (DENTA 5000 PLUS) 1.1 % CREA dental cream Denta 5000 Plus 1.1 % cream  USE AS DIRECTED    [provider]  sulfamethoxazole-trimethoprim (BACTRIM DS) 800-160 MG tablet Take 1 tablet by mouth 2 (two) times daily. 05/11/19   WTrula Slade DPM  SUMAtriptan (IMITREX) 50 MG tablet TAKE 1 TAB EVERY 2 HOURS AS NEEDED FOR MIGRAINE. MAY REPEAT IN 2 HOURS IF PERSISTS OR RECURS 03/05/19   PSusy Frizzle MD  topiramate (TOPAMAX) 50 MG tablet TAKE 1 TABLET BY MOUTH TWICE A DAY 09/25/18   PSusy Frizzle MD  traZODone (DESYREL) 100 MG tablet Take 100 mg by mouth at bedtime. 07/29/14   [provider]  XShirley Friar5 % SOLN  10/20/18   [provider]    Allergies    Penicillins, Tetracyclines & related, Tramadol, Phenazopyridine, Tetracycline, Ciprofloxacin, Codeine, and Estradiol  Review of Systems   Review of Systems  Constitutional: Negative for chills and fever.  HENT: Negative.   Eyes: Negative.   Respiratory: Negative.   Cardiovascular: Negative.   Gastrointestinal: Positive for nausea.  Endocrine: Negative.   Genitourinary: Negative.   Musculoskeletal: Positive for back pain.  Skin: Negative.   Allergic/Immunologic: Negative.   Neurological: Negative.   Hematological: Negative.   Psychiatric/Behavioral: Negative.   All other systems reviewed and are negative.   Physical Exam Updated Vital Signs BP (!) 147/92 (BP Location: Left Arm)   Pulse 88   Temp 98.6 F (37 C) (Oral)   Resp 16   Ht 1.626 m ('5\' 4"'$ )   Wt  106.1 kg   SpO2 98%   BMI 40.17 kg/m   Physical Exam Vitals and nursing note reviewed.  Constitutional:      Appearance: Normal appearance.  HENT:     Head: Normocephalic.     Right Ear: External ear normal.       Left Ear: External ear normal.     Nose: Nose normal.     Mouth/Throat:     Mouth: Mucous membranes are moist.  Eyes:     Pupils: Pupils are equal, round, and reactive to light.  Cardiovascular:     Rate and Rhythm: Normal rate.  Pulmonary:     Effort: Pulmonary effort is normal.  Abdominal:     General: Abdomen is flat.  Musculoskeletal:     Cervical back: Normal range of motion.     Comments: Right foot with ulcer later to small toe and great toe with erythema proximally over dorsal aspect of foot, no proximal erythema. See pics in Dr. Leigh Aurora chart  Skin:    General: Skin is warm.     Capillary Refill: Capillary refill takes less than 2 seconds.  Neurological:     General: No focal deficit present.     Mental Status: She is alert and oriented to person, place, and time.  Psychiatric:        Mood and Affect: Mood normal.        Behavior: Behavior normal.     ED Results / Procedures / Treatments   Labs (all labs ordered are listed, but only abnormal results are displayed) Labs Reviewed - No data to display  EKG None  Radiology DG Foot Complete Right  Result Date: 05/15/2019 Please see detailed radiograph report in office note.   Procedures Procedures (including critical care time)  Medications Ordered in ED Medications - No data to display  ED Course  I have reviewed the triage vital signs and the nursing notes.  Pertinent labs & imaging results that were available during my care of the patient were reviewed by me and considered in my medical decision making (see chart for details). 54 yo female with foot wounds for 8 weeks worsening despite multiple abx.  Dr. Jacqualyn Posey advises patient should be admitted for iv abx and he will see in consult. Wound cultures, x-Tylerjames Hoglund, obtained op. Discussed with hospitalist who will see for admission.    MDM Rules/Calculators/A&P                       Final Clinical Impression(s) / ED Diagnoses Final diagnoses:   Osteomyelitis of foot, right, acute [facility])    Rx / DC Orders ED Discharge Orders    None       Pattricia Boss, MD 05/15/19 1536

## 2019-05-15 NOTE — ED Notes (Signed)
ED TO INPATIENT HANDOFF REPORT  Name/Age/Gender [name] [name] 54 y.o. female  Code Status    Code Status Orders  (From admission, onward)         Start     Ordered   05/15/19 1616  Full code  Continuous     05/15/19 1618        Code Status History    Date Active Date Inactive Code Status Order ID Comments User Context   01/31/2018 1004 02/03/2018 2304 Full Code [id number]  Karmen Bongo, MD ED   09/24/2017 2044 09/26/2017 2115 Full Code [id number]  Toy Baker, MD ED   11/23/2016 1804 11/25/2016 1550 Full Code [id number]  Barrett, Evelene Croon, PA-C ED   11/14/2015 2350 11/15/2015 1932 Full Code [id number]  Etta Quill, DO ED   10/26/2014 1901 10/27/2014 2133 Full Code [id number]  Patrecia Pour, MD Inpatient   05/17/2011 1721 05/18/2011 0732 Full Code [id number]  Ezequiel Essex, MD ED   Advance Care Planning Activity      Home/SNF/Other Home  Chief Complaint Cellulitis [L03.90]  Level of Care/Admitting Diagnosis ED Disposition    ED Disposition Condition [facility] Area: [facility] [[id number]]  Level of Care: Med-Surg [16]  Covid Evaluation: Asymptomatic Screening Protocol (No Symptoms)  Diagnosis: Cellulitis [id number]  Admitting Physician: Guilford Shi [id number]  Attending Physician: Guilford Shi [id number]  Estimated length of stay: 3 - 4 days  Certification:: I certify this patient will need inpatient services for at least 2 midnights       Medical History Past Medical History:  Diagnosis Date  . Anxiety   . Asthma    mild intermittent  . Bipolar 1 disorder ([place])    ect treatments last treatment Sep 02 1011  . Depression   . GERD (gastroesophageal reflux disease)   . Hypertension   . Pre-diabetes   . Seizures ([place])    last seizure was so long ago she can't remember.   . Sleep apnea    wears CPAP, uncertain of setting    Allergies Allergies  Allergen Reactions  . Penicillins Hives and Rash    Has patient  had a PCN reaction causing immediate rash, facial/tongue/throat swelling, SOB or lightheadedness with hypotension: Yes Has patient had a PCN reaction causing severe rash involving mucus membranes or skin necrosis: No Has patient had a PCN reaction that required hospitalization: No Has patient had a PCN reaction occurring within the last 10 years: No If all of the above answers are "NO", then may proceed with Cephalosporin use.  . Tetracyclines & Related Other (See Comments)    Syncope and put her "in a coma"  . Tramadol Other (See Comments)    Seizures  . Phenazopyridine Other (See Comments)    Unknown  . Tetracycline Other (See Comments)    Syncope and "Put me in a coma"  . Ciprofloxacin Rash and Itching  . Codeine Itching and Rash  . Estradiol Rash    Patches broke out the skin    IV Location/Drains/Wounds Patient Lines/Drains/Airways Status   Active Line/Drains/Airways    Name:   Placement date:   Placement time:   Site:   Days:   Peripheral IV 05/15/19 Right Antecubital   05/15/19    1406    Antecubital   less than 1   Incision (Closed) 02/02/18 Foot Left   02/02/18    1329     467   Pressure Ulcer 11/15/15 Unstageable - Full thickness  tissue loss in which the base of the ulcer is covered by slough (yellow, tan, gray, green or brown) and/or eschar (tan, brown or black) in the wound bed.   11/15/15    1300     1277   Wound / Incision (Open or Dehisced) Other (Comment) Toe (Comment  which one) Right    --    --    Toe (Comment  which one)             Labs/Imaging Results for orders placed or performed during the hospital encounter of 05/15/19 (from the past 48 hour(s))  CBC     Status: Abnormal   Collection Time: 05/15/19  2:01 PM  Result Value Ref Range   WBC 7.2 4.0 - 10.5 K/uL   RBC 3.56 (L) 3.87 - 5.11 MIL/uL   Hemoglobin 11.4 (L) 12.0 - 15.0 g/dL   HCT 35.2 (L) 36.0 - 46.0 %   MCV 98.9 80.0 - 100.0 fL   MCH 32.0 26.0 - 34.0 pg   MCHC 32.4 30.0 - 36.0 g/dL   RDW 12.6  11.5 - 15.5 %   Platelets 310 150 - 400 K/uL   nRBC 0.0 0.0 - 0.2 %    Comment: Performed at [facility], [place] [address], [place], [place] [id number]  Basic metabolic panel     Status: None   Collection Time: 05/15/19  2:01 PM  Result Value Ref Range   Sodium 137 135 - 145 mmol/L   Potassium 3.7 3.5 - 5.1 mmol/L   Chloride 104 98 - 111 mmol/L   CO2 25 22 - 32 mmol/L   Glucose, Bld 90 70 - 99 mg/dL   BUN 9 6 - 20 mg/dL   Creatinine, Ser 0.71 0.44 - 1.00 mg/dL   Calcium 9.4 8.9 - 10.3 mg/dL   GFR calc non Af Amer >60 >60 mL/min   GFR calc Af Amer >60 >60 mL/min   Anion gap 8 5 - 15    Comment: Performed at [facility], [place] [address], [place], [place] [address]   DG Foot Complete Right  Result Date: 05/15/2019 Please see detailed radiograph report in office note.   Pending Labs Unresulted Labs (From admission, onward)    Start     Ordered   05/22/19 0500  Creatinine, serum  (enoxaparin (LOVENOX)    CrCl >/= 30 ml/min)  Weekly,   R    Comments: while on enoxaparin therapy    05/15/19 1618   05/16/19 0500  Comprehensive metabolic panel  Tomorrow morning,   R     05/15/19 1618   05/16/19 0500  CBC  Tomorrow morning,   R     05/15/19 1618   05/16/19 0500  Protime-INR  Tomorrow morning,   R     05/15/19 1618   05/15/19 1615  HIV Antibody (routine testing w rflx)  (HIV Antibody (Routine testing w reflex) panel)  Once,   STAT     05/15/19 1618   05/15/19 1615  CBC  (enoxaparin (LOVENOX)    CrCl >/= 30 ml/min)  Once,   STAT    Comments: Baseline for enoxaparin therapy IF NOT ALREADY DRAWN.  Notify MD if PLT < 100 K.    05/15/19 1618   05/15/19 1427  Blood culture (routine x 2)  BLOOD CULTURE X 2,   STAT     05/15/19 1426   05/15/19 1353  SARS CORONAVIRUS 2 (TAT 6-24 HRS) Nasopharyngeal Nasopharyngeal Swab  (  Tier 3 (TAT 6-24 hrs))  Once,   STAT    Question Answer Comment  Is this test for diagnosis or screening Screening   Symptomatic for  COVID-19 as defined by CDC No   Hospitalized for COVID-19 No   Admitted to ICU for COVID-19 No   Previously tested for COVID-19 No   Resident in a congregate (group) care setting No   Employed in healthcare setting No   Pregnant No      05/15/19 1352          Vitals/Pain Today's Vitals   05/15/19 1329 05/15/19 1330 05/15/19 1455 05/15/19 1600  BP:   (!) 142/90 110/84  Pulse:   78 74  Resp:   18 16  Temp:      TempSrc:      SpO2:   100% 97%  Weight:  106.1 kg    Height:  5\' 4"  (1.626 m)    PainSc: 0-No pain       Isolation Precautions No active isolations  Medications Medications  morphine (MS CONTIN) 12 hr tablet 30 mg (has no administration in time range)  metoprolol succinate (TOPROL-XL) 24 hr tablet 25 mg (has no administration in time range)  busPIRone (BUSPAR) tablet 15 mg (has no administration in time range)  DULoxetine (CYMBALTA) DR capsule 60 mg (has no administration in time range)  escitalopram (LEXAPRO) tablet 5 mg (has no administration in time range)  haloperidol (HALDOL) tablet 5 mg (has no administration in time range)  traZODone (DESYREL) tablet 100 mg (has no administration in time range)  enoxaparin (LOVENOX) injection 40 mg (has no administration in time range)  lactated ringers infusion (has no administration in time range)  acetaminophen (TYLENOL) tablet 650 mg (has no administration in time range)    Or  acetaminophen (TYLENOL) suppository 650 mg (has no administration in time range)  fentaNYL (SUBLIMAZE) injection 25 mcg (has no administration in time range)  vancomycin (VANCOCIN) IVPB 1000 mg/200 mL premix (has no administration in time range)  vancomycin (VANCOREADY) IVPB 750 mg/150 mL (has no administration in time range)  vancomycin (VANCOCIN) IVPB 1000 mg/200 mL premix (0 mg Intravenous Stopped 05/15/19 1617)    Mobility walks

## 2019-05-15 NOTE — H&P (Addendum)
History and Physical    DOA: 05/15/2019  PCP: [name], MD  Patient coming from: home  Chief Complaint: Sent from podiatry clean for worsening leg wounds  HPI: [name] [name] is a 54 y.o. female with history h/o  HTN, pre diabetes, asthma, GERD, depression, sleep apnea on CPAP, chronic neuropathic foot ulcers followed by Podiatry Dr Jacqualyn Posey presented to podiatry clinic today with worsening foot wounds/foul smelling drainage and surrounding redness. It appears that patient was being followed closely by Dr Jacqualyn Posey as outpatient and wound cultures from 1/12 were growing MSSA, several antibiotics were tried based on sensitivity and then switched either due to intolerance or due to inadequate response (prescribed Bactrim initially then changed to clindamycin on 1/12->then to Levaquin on 1/19 after wound cultures showed resistance to clindamycin-->changed to Bactrim on 1/22.)  She failed outpatient management with local dressings/oral antibiotics and given worsening symptoms, concern for osteomyelitis-patient referred to ED for admission/MRI/IV antibiotics and possible operative intervention/amputation.  She denies any upper respiratory symptoms or fever or chills.  Denies any sick contacts with ongoing Covid pandemic. ED course: Afebrile with temp 98.6, heart rate 78-1 13, respiratory rate 18, blood pressure slightly elevated at 142/90, O2 sat 100% on room air.  Labs show WBC 7.2, hemoglobin 11.4, platelets 310, sodium 137, potassium 3.7 chloride 104, bicarb 25, BUN 9, creatinine 0.71, calcium 9.4.    Wound cultures from 1/12 as mentioned above grew MSSA sensitive to most antibiotics, resistant to clindamycin/erythromycin.  Repeat wound cultures and Blood cultures sent from ED today and patient started on IV vancomycin. X ray of the foot obtained in clinic today concerning for osteomyelitis.  She has allergic reactions/intolerance listed to penicillins, tetracyclines and ciprofloxacin.    Review  of Systems: As per HPI otherwise 10 point review of systems negative.    Past Medical History:  Diagnosis Date  . Anxiety   . Asthma    mild intermittent  . Bipolar 1 disorder ([place])    ect treatments last treatment Sep 02 1011  . Depression   . GERD (gastroesophageal reflux disease)   . Hypertension   . Pre-diabetes   . Seizures ([place])    last seizure was so long ago she can't remember.   . Sleep apnea    wears CPAP, uncertain of setting    Past Surgical History:  Procedure Laterality Date  . ABDOMINAL HYSTERECTOMY    . AMPUTATION TOE Left 02/02/2018   Procedure: AMPUTATION TOE Left 4th toe;  Surgeon: [name], DPM;  Location: [facility];  Service: Podiatry;  Laterality: Left;  . BACK SURGERY    . CARPAL TUNNEL RELEASE     x2  . Laproscopic knee surgery    . NECK SURGERY  03/15/2017  . PLANTAR FASCIA RELEASE     x2    Social history:  reports that she has never smoked. She has never used smokeless tobacco. She reports that she does not drink alcohol or use drugs.   Allergies  Allergen Reactions  . Penicillins Hives and Rash    Has patient had a PCN reaction causing immediate rash, facial/tongue/throat swelling, SOB or lightheadedness with hypotension: Yes Has patient had a PCN reaction causing severe rash involving mucus membranes or skin necrosis: No Has patient had a PCN reaction that required hospitalization: No Has patient had a PCN reaction occurring within the last 10 years: No If all of the above answers are "NO", then may proceed with Cephalosporin use.  . Tetracyclines & Related  Other (See Comments)    Syncope and put her "in a coma"  . Tramadol Other (See Comments)    Seizures  . Phenazopyridine Other (See Comments)    Unknown  . Tetracycline Other (See Comments)    Syncope and "Put me in a coma"  . Ciprofloxacin Rash and Itching  . Codeine Itching and Rash  . Estradiol Rash    Patches broke out the skin    Family History  Problem Relation  Age of Onset  . Diabetes Mother   . COPD Mother   . Hypertension Mother   . Hyperlipidemia Mother   . Heart disease Father   . Hyperlipidemia Father   . Hypertension Father   . Cancer Father   . Heart disease Brother   . Heart disease Daughter   . Heart disease Maternal Grandmother   . Hypertension Maternal Grandmother   . Heart disease Maternal Grandfather   . Kidney cancer Paternal Grandmother   . Heart disease Paternal Grandfather       Prior to Admission medications   Medication Sig Start Date End Date Taking? Authorizing Provider  busPIRone (BUSPAR) 15 MG tablet Take 15 mg by mouth 3 (three) times daily.  04/26/17  Yes [provider]  Calcium Carbonate-Vit D-Min (CALCIUM 1200 PO) Take 1,200 mg by mouth daily.    Yes [provider]  cyclobenzaprine (FLEXERIL) 10 MG tablet Take 10 mg by mouth 2 (two) times daily as needed for muscle spasms.    Yes [provider]  Dextran 70-Hypromellose, PF, (ARTIFICIAL TEARS PF) 0.1-0.3 % SOLN Place 1-2 drops into both eyes 3 (three) times daily as needed (for dry eyes).   Yes [provider]  DULoxetine (CYMBALTA) 60 MG capsule TAKE 1 CAPSULE BY MOUTH EVERY DAY Patient taking differently: Take 60 mg by mouth at bedtime.  04/11/19  Yes [name], MD  Erenumab-aooe (AIMOVIG) 70 MG/ML SOAJ Inject 70 mg into the skin every 30 (thirty) days. 02/12/19  Yes [name], MD  escitalopram (LEXAPRO) 5 MG tablet Take 5 mg by mouth daily.  04/30/19  Yes [provider]  estradiol (ESTRACE) 1 MG tablet TAKE 1 TABLET BY MOUTH EVERY DAY Patient taking differently: Take 1 mg by mouth at bedtime.  09/14/18  Yes [name], MD  fluticasone (FLONASE) 50 MCG/ACT nasal spray SPRAY 2 SPRAYS INTO EACH NOSTRIL EVERY DAY Patient taking differently: Place 2 sprays into both nostrils daily as needed.  12/11/18  Yes [name], MD  gabapentin (NEURONTIN) 600 MG tablet Take 600 mg by mouth 4 (four)  times daily.   Yes [provider]  haloperidol (HALDOL) 5 MG tablet Take 1 tablet (5 mg total) by mouth at bedtime. For psychosis 11/15/15  Yes Barton Dubois, MD  levocetirizine (XYZAL) 5 MG tablet TAKE 1 TABLET BY MOUTH EVERY DAY IN THE EVENING Patient taking differently: Take 5 mg by mouth every evening.  09/25/18  Yes Pickard, Cammie Mcgee, MD  LINZESS 290 MCG CAPS capsule TAKE 1 CAPSULE BY MOUTH EVERY DAY IN THE MORNING BEFORE BREAKFAST Patient taking differently: Take 290 mcg by mouth daily.  12/18/18  Yes [name], MD  meloxicam (MOBIC) 15 MG tablet Take 15 mg by mouth at bedtime.  09/15/17  Yes [provider]  metoprolol succinate (TOPROL-XL) 25 MG 24 hr tablet Take 1 tablet (25 mg total) by mouth daily. 05/03/19  Yes [name], MD  morphine (MS CONTIN) 30 MG 12 hr tablet  Take 30 mg by mouth every 8 (eight) hours.  05/01/18  Yes [provider]  Multiple Vitamins-Minerals (MULTIVITAMIN WITH MINERALS) tablet Take 1 tablet by mouth daily.   Yes [provider]  NARCAN 4 MG/0.1ML LIQD nasal spray kit Place 1 spray into the nose once.  12/19/18  Yes [provider]  nystatin (MYCOSTATIN/NYSTOP) powder APPLY TO AFFECTED AREA 4 TIMES A DAY Patient taking differently: Apply 1 application topically daily as needed (yeast under breast).  02/23/19  Yes [name], MD  pantoprazole (PROTONIX) 40 MG tablet TAKE 1 TABLET BY MOUTH TWICE A DAY Patient taking differently: Take 40 mg by mouth 2 (two) times daily.  04/27/19  Yes [name], MD  potassium chloride SA (KLOR-CON M20) 20 MEQ tablet TAKE TWO TABLETS (40 MEQ) BY MOUTH EVERY DAY Patient taking differently: Take 20 mEq by mouth 2 (two) times daily.  02/08/19  Yes [name], MD  PROAIR HFA 108 [phone] Base) MCG/ACT inhaler Inhale 2 puffs = 122mg into the lungs every 6 (six) hours as needed for wheezing or shortness of breath. Patient taking differently: Inhale 2 puffs into the lungs  every 6 (six) hours as needed for wheezing.  01/10/18  Yes PSusy Frizzle MD  promethazine (PHENERGAN) 25 MG tablet Take 1 tablet (25 mg total) by mouth every 8 (eight) hours as needed for nausea or vomiting. 05/01/19  Yes WTrula Slade DPM  silver sulfADIAZINE (SILVADENE) 1 % cream Apply 1 application topically daily. 05/01/19  Yes WTrula Slade DPM  sulfamethoxazole-trimethoprim (BACTRIM DS) 800-160 MG tablet Take 1 tablet by mouth 2 (two) times daily. 05/11/19  Yes WTrula Slade DPM  SUMAtriptan (IMITREX) 50 MG tablet TAKE 1 TAB EVERY 2 HOURS AS NEEDED FOR MIGRAINE. MAY REPEAT IN 2 HOURS IF PERSISTS OR RECURS Patient taking differently: Take 50 mg by mouth every 2 (two) hours as needed for migraine.  03/05/19  Yes PSusy Frizzle MD  topiramate (TOPAMAX) 50 MG tablet TAKE 1 TABLET BY MOUTH TWICE A DAY Patient taking differently: Take 50 mg by mouth 2 (two) times daily.  09/25/18  Yes PSusy Frizzle MD  traZODone (DESYREL) 100 MG tablet Take 100 mg by mouth at bedtime. 07/29/14  Yes [provider]  XIIDRA 5 % SOLN Place 1 drop into both eyes 2 (two) times daily.  10/20/18  Yes [provider]  levofloxacin (LEVAQUIN) 500 MG tablet Take 1 tablet (500 mg total) by mouth daily. Patient not taking: Reported on 05/15/2019 05/08/19   WTrula Slade DPM  linaclotide ([facility] 290 MCG CAPS capsule TAKE 1 CAuroraPatient not taking: Reported on 05/15/2019 12/18/18   PSusy Frizzle MD  mupirocin ointment (BACTROBAN) 2 % Apply 1 application topically 2 (two) times daily. Patient not taking: Reported on 05/15/2019 03/26/19   WTrula Slade DPM  promethazine (PHENERGAN) 25 MG tablet Take 1 tablet (25 mg total) by mouth every 8 (eight) hours as needed for nausea or vomiting. Patient not taking: Reported on 05/15/2019 05/23/18   PSusy Frizzle MD    Physical Exam: Vitals:   05/15/19 1327 05/15/19 1330 05/15/19 1455 05/15/19 1600    BP: (!) 147/92  (!) 142/90 110/84  Pulse: 88  78 74  Resp: _0 Temp: 98.6 F (37 C)     TempSrc: Oral     SpO2: 98%  100% 97%  Weight:  106.1 kg    Height:  _0  (1.626 m)      Constitutional: NAD, calm, comfortable Eyes: PERRL, lids and conjunctivae normal ENMT: Mucous membranes are moist. Posterior pharynx clear of any exudate or lesions.Normal dentition.  Neck: normal, supple, no masses, no thyromegaly Respiratory: clear to auscultation bilaterally, no wheezing, no crackles. Normal respiratory effort. No accessory muscle use.  Cardiovascular: Regular rate and rhythm, no murmurs / rubs / gallops. No extremity edema. 2+ pedal pulses. No carotid bruits.  Abdomen: no tenderness, no masses palpated. No hepatosplenomegaly. Bowel sounds positive.  Musculoskeletal: no clubbing / cyanosis. No joint deformity upper and lower extremities. Good ROM, no contractures. Normal muscle tone.  Neurologic: CN 2-12 grossly intact.  Decreased sensations to crude touch along the dorsum of bilateral feet, DTR normal. Strength 5/5 in all 4.  Psychiatric: Normal judgment and insight. Alert and oriented x 3. Normal mood.  SKIN/catheters: Nonhealing foot wounds as shown below along plantar aspect of right great toe, lateral aspect of fifth metatarsal head with surrounding cellulitis.          Labs on Admission: I have personally reviewed following labs and imaging studies  CBC: Recent Labs  Lab 05/15/19 1401 05/15/19 1627  WBC 7.2 6.7  HGB 11.4* 10.4*  HCT 35.2* 32.9*  MCV 98.9 99.4  PLT 310 309   Basic Metabolic Panel: Recent Labs  Lab 05/15/19 1401  NA 137  K 3.7  CL 104  CO2 25  GLUCOSE 90  BUN 9  CREATININE 0.71  CALCIUM 9.4   GFR: Estimated Creatinine Clearance: 94.5 mL/min (by C-G formula based on SCr of 0.71 mg/dL). Liver Function Tests: No results for input(s): AST, ALT, ALKPHOS, BILITOT, PROT, ALBUMIN in the last 168 hours. No results for input(s): LIPASE,  AMYLASE in the last 168 hours. No results for input(s): AMMONIA in the last 168 hours. Coagulation Profile: No results for input(s): INR, PROTIME in the last 168 hours. Cardiac Enzymes: No results for input(s): CKTOTAL, CKMB, CKMBINDEX, TROPONINI in the last 168 hours. BNP (last 3 results) No results for input(s): PROBNP in the last 8760 hours. HbA1C: No results for input(s): HGBA1C in the last 72 hours. CBG: No results for input(s): GLUCAP in the last 168 hours. Lipid Profile: No results for input(s): CHOL, HDL, LDLCALC, TRIG, CHOLHDL, LDLDIRECT in the last 72 hours. Thyroid Function Tests: No results for input(s): TSH, T4TOTAL, FREET4, T3FREE, THYROIDAB in the last 72 hours. Anemia Panel: No results for input(s): VITAMINB12, FOLATE, FERRITIN, TIBC, IRON, RETICCTPCT in the last 72 hours. Urine analysis:    Component Value Date/Time   COLORURINE YELLOW 01/31/2018 0248   APPEARANCEUR CLEAR 01/31/2018 0248   LABSPEC 1.015 01/31/2018 0248   PHURINE 5.0 01/31/2018 0248   GLUCOSEU NEGATIVE 01/31/2018 0248   HGBUR NEGATIVE 01/31/2018 0248   BILIRUBINUR NEGATIVE 01/31/2018 0248   KETONESUR NEGATIVE 01/31/2018 0248   PROTEINUR NEGATIVE 01/31/2018 0248   UROBILINOGEN 0.2 06/23/2017 1108   NITRITE NEGATIVE 01/31/2018 0248   LEUKOCYTESUR NEGATIVE 01/31/2018 0248    Radiological Exams on Admission: Personally reviewed  DG Foot Complete Right  Result Date: 05/15/2019 Please see detailed radiograph report in office note.   EKG: pending      Assessment and Plan:   Principal Problem:   Osteomyelitis ([place]) Active Problems:   Non-healing open wound of toe   Cellulitis of toe of right foot   Foot ulcer ([name])   Manic bipolar I disorder ([name])   Toe ulcer, right ([name])   Hallux malleus   Essential hypertension  Migraines   Post laminectomy syndrome   Sleep apnea   Spinal stenosis of lumbar region    1. Right non healing infected foot wounds with surrounding  Cellulitis and  possible osteomyelitis --will admit patient with IV vancomycin (per pharmacy consult).  Currently afebrile without leukocytosis.  Does not have peripheral sensations due to neuropathy.  Obtain MRI per podiatry recommendations to rule out osteomyelitis.  Will likely need operative intervention/amputation per podiatry to avoid sepsis especially in the setting of spinal implants.  Dr. Earleen Newport to follow along while hospitalized.  Resume MS Contin per outpatient regimen, IV fentanyl for breakthrough pain.  Covid screen pending.  2.  Depression/bipolar disorder: Patient on multiple psych medications including Cymbalta, buspirone, Lexapro and Haldol.  Obtain EKG for QT interval.  Resume home medications for now.  3.  Asthma: Saturating well on room air in no acute issues.  Resume MDI as needed  4.  History of migraines: No complaints of headache currently.  Supportive management as needed.  5.  Prediabetes: Monitor blood glucose in the setting of acute infection.  States was told to be prediabetic many years back and has not followed up.  Check hemoglobin A1c.  Diabetic diet.  6.  Spinal stenosis/chronic back pain: Patient underwent back surgeries and has implants.  Follows pain clinic and is on MS Contin chronically.  Resume at 30 mg twice daily and have IV fentanyl as needed available for breakthrough pain while here.  7.  Sleep apnea: Resume CPAP  8.  History of seizures: Patient not on any antiepileptics and reports tramadol induced seizures in the past.  9.  Obesity with BMI 40: Advise regarding diet modifications.  Follow-up PCP  DVT prophylaxis: Lovenox  COVID screen: Pending  Code Status: Full code, next of kin is mother  Patient/Family Communication: Discussed with patient and all questions answered to satisfaction.  Consults called: Podiatry Dr. Jacqualyn Posey Admission status :I certify that at the point of admission it is my clinical judgment that the patient will require inpatient hospital  care spanning beyond 2 midnights from the point of admission due to high intensity of service and high frequency of surveillance required.Inpatient status is judged to be reasonable and necessary in order to provide the required intensity of service to ensure the patient's safety. The patient's presenting symptoms, physical exam findings, and initial radiographic and laboratory data in the context of their chronic comorbidities is felt to place them at high risk for further clinical deterioration. The following factors support the patient status of inpatient : Nonhealing foot wounds with cellulitis/wound infection with possible osteomyelitis-failed outpatient oral antibiotic therapy Expected LOS: 3 to 4 days    Guilford Shi MD [facility] Pager [phone]  If 7PM-7AM, please contact night-coverage [email or web address] Password [facility]  05/15/2019, 5:38 PM

## 2019-05-15 NOTE — ED Triage Notes (Signed)
Sent for PCP due to infection of rt toe and foot.

## 2019-05-15 NOTE — Progress Notes (Signed)
Pharmacy Antibiotic Note  [name] [name] is a 54 y.o. female with right foot wound/ulcerations/cellulitis on bactrim PTA who was instructed by her podiatrist Dr. Jacqualyn Posey to come to the ED on 1/26 for evaluation and management of worsening foot cellulitis with concern for osteomyelitis. Pharmacy was consulted to start vancomycin for infection.  - scr 0.71 (crcl~94) - vancomycin 1000 mg given in the ED at 1444  Plan: - give an addition vancomycin 1000 mg x1 to get a total of 2000 mg, then 750 mg q12h for est AUC 461  ____________________________________  Height: 5\' 4"  (162.6 cm) Weight: 234 lb (106.1 kg) IBW/kg (Calculated) : 54.7  Temp (24hrs), Avg:97.4 F (36.3 C), Min:96.1 F (35.6 C), Max:98.6 F (37 C)  Recent Labs  Lab 05/15/19 1401  WBC 7.2  CREATININE 0.71    Estimated Creatinine Clearance: 94.5 mL/min (by C-G formula based on SCr of 0.71 mg/dL).    Allergies  Allergen Reactions  . Penicillins Hives and Rash    Has patient had a PCN reaction causing immediate rash, facial/tongue/throat swelling, SOB or lightheadedness with hypotension: Yes Has patient had a PCN reaction causing severe rash involving mucus membranes or skin necrosis: No Has patient had a PCN reaction that required hospitalization: No Has patient had a PCN reaction occurring within the last 10 years: No If all of the above answers are "NO", then may proceed with Cephalosporin use.  . Tetracyclines & Related Other (See Comments)    Syncope and put her "in a coma"  . Tramadol Other (See Comments)    Seizures  . Phenazopyridine Other (See Comments)    Unknown  . Tetracycline Other (See Comments)    Syncope and "Put me in a coma"  . Ciprofloxacin Rash and Itching  . Codeine Itching and Rash  . Estradiol Rash    Patches broke out the skin   Antimicrobials this admission:  1/26 vanc>>  Microbiology results:  1/12 right foot wound: MSSA  1/26 BCx x2:  Thank you for allowing pharmacy to be a  part of this patient's care.  Lynelle Doctor 05/15/2019 4:28 PM

## 2019-05-15 NOTE — Progress Notes (Signed)
Call made to ED to get report on patient. Report given by Joellen Jersey.  Donne Hazel, RN

## 2019-05-15 NOTE — Progress Notes (Addendum)
Subjective: 54 year old female presents the office today for follow-up evaluation of wounds to her right foot and cellulitis.  She called the office yesterday stating she had increased odor coming from the wound and that there was flesh coming from the wound.  Unfortunate she has not been able tolerated many of the oral antibiotics and she still on Bactrim.  Overall she feels well and denies any systemic complaints.  Denies any fevers, chills.  No nausea or vomiting.  No calf pain, chest pain, shortness of breath  Objective: AAO x3, NAD DP/PT pulses palpable 2/4 bilaterally Sensation absent Ulceration present on the plantar aspect of the right hallux and there is still continued edema and erythema to the big toe.  Ulceration to the lateral aspect of the fifth metatarsal head which does probe to bone and there is surrounding erythema extending the dorsal aspect the foot and there is increased warmth and swelling to the foot.  There is fibrotic tissue present in the wound.  There is no fluctuation or crepitation. No open lesions or pre-ulcerative lesions.  No pain with calf compression, swelling, warmth, erythema          Assessment: Ulcerations right foot with worsening cellulitis and concern for osteomyelitis  Plan: -All treatment options discussed with the patient including all alternatives, risks, complications.  -X-rays obtained reviewed.  There is edema present fifth metatarsal head.  I am concerned about developing osteomyelitis. -I debrided the wound on the right fifth metatarsal head without any complications to remove some the loose tissue. -Given that she has been on oral antibiotics now and there is been worsening signs of infection I do recommend hospitalization for IV antibiotics and likely amputation.  I will be happy to follow her while in the hospital.  We have discussed amputation today versus going to bring would recommend IV antibiotics to start as well as MRI of the  foot. -I contacted [facility] ER to notify them of her arrival.   [name] DPM O: [phone] C: [phone]

## 2019-05-16 ENCOUNTER — Inpatient Hospital Stay (HOSPITAL_COMMUNITY): Payer: 59

## 2019-05-16 DIAGNOSIS — M86271 Subacute osteomyelitis, right ankle and foot: Secondary | ICD-10-CM

## 2019-05-16 DIAGNOSIS — L03115 Cellulitis of right lower limb: Secondary | ICD-10-CM

## 2019-05-16 DIAGNOSIS — L03116 Cellulitis of left lower limb: Secondary | ICD-10-CM

## 2019-05-16 DIAGNOSIS — M2031 Hallux varus (acquired), right foot: Secondary | ICD-10-CM

## 2019-05-16 LAB — PROTIME-INR
INR: 1.1 (ref 0.8–1.2)
Prothrombin Time: 13.7 seconds (ref 11.4–15.2)

## 2019-05-16 LAB — GLUCOSE, CAPILLARY
Glucose-Capillary: 100 mg/dL — ABNORMAL HIGH (ref 70–99)
Glucose-Capillary: 108 mg/dL — ABNORMAL HIGH (ref 70–99)
Glucose-Capillary: 92 mg/dL (ref 70–99)
Glucose-Capillary: 111 mg/dL — ABNORMAL HIGH (ref 70–99)

## 2019-05-16 LAB — COMPREHENSIVE METABOLIC PANEL
ALT: 15 U/L (ref 0–44)
AST: 16 U/L (ref 15–41)
Albumin: 3.3 g/dL — ABNORMAL LOW (ref 3.5–5.0)
Alkaline Phosphatase: 64 U/L (ref 38–126)
Anion gap: 8 (ref 5–15)
BUN: 9 mg/dL (ref 6–20)
CO2: 25 mmol/L (ref 22–32)
Calcium: 8.8 mg/dL — ABNORMAL LOW (ref 8.9–10.3)
Chloride: 105 mmol/L (ref 98–111)
Creatinine, Ser: 0.71 mg/dL (ref 0.44–1.00)
GFR calc Af Amer: >60 mL/min (ref >60)
GFR calc non Af Amer: >60 mL/min (ref >60)
Glucose, Bld: 99 mg/dL (ref 70–99)
Potassium: 3.8 mmol/L (ref 3.5–5.1)
Sodium: 138 mmol/L (ref 135–145)
Total Bilirubin: 0.6 mg/dL (ref 0.3–1.2)
Total Protein: 6.3 g/dL — ABNORMAL LOW (ref 6.5–8.1)

## 2019-05-16 LAB — CBC
HCT: 32.3 % — ABNORMAL LOW (ref 36.0–46.0)
Hemoglobin: 10.6 g/dL — ABNORMAL LOW (ref 12.0–15.0)
MCH: 32 pg (ref 26.0–34.0)
MCHC: 32.8 g/dL (ref 30.0–36.0)
MCV: 97.6 fL (ref 80.0–100.0)
Platelets: 280 10*3/uL (ref 150–400)
RBC: 3.31 MIL/uL — ABNORMAL LOW (ref 3.87–5.11)
RDW: 12.6 % (ref 11.5–15.5)
WBC: 5.8 10*3/uL (ref 4.0–10.5)
nRBC: 0 % (ref 0.0–0.2)

## 2019-05-16 MED ORDER — GADOBUTROL 1 MMOL/ML IV SOLN
10.0000 mL | Freq: Once | INTRAVENOUS | Status: AC | PRN
  Administered 2019-05-16: 10 mL via INTRAVENOUS

## 2019-05-16 NOTE — Progress Notes (Signed)
PROGRESS NOTE    ERSULA COMBEST  [id number]  DOB: Jun 27, 1965  PCP: [name], MD 54 y.o. female with history h/o  HTN, pre diabetes, asthma, GERD, depression, sleep apnea on CPAP, chronic neuropathic foot ulcers followed by Podiatry Dr Jacqualyn Posey presented to podiatry clinic today with worsening foot wounds/foul smelling drainage and surrounding redness. It appears that patient was being followed closely by Dr Jacqualyn Posey as outpatient and wound cultures from 1/12 were growing MSSA, several antibiotics were tried based on sensitivity and then switched either due to intolerance or due to inadequate response . She failed outpatient management with local dressings/oral antibiotics and given worsening symptoms, concern for osteomyelitis-patient referred to ED for admission/MRI/IV antibiotics and possible operative intervention/amputation  Subjective:  Underwent MRI earlier today.  Afebrile and denies any acute complaints.  Objective: Vitals:   05/15/19 1600 05/15/19 2018 05/16/19 0523 05/16/19 1340  BP: 110/84 139/81 126/76 122/76  Pulse: 74 74 78 73  Resp: 16 12 16 16   Temp:  97.7 F (36.5 C) 98.7 F (37.1 C) 97.8 F (36.6 C)  TempSrc:  Oral Oral Oral  SpO2: 97% 100% 99% 99%  Weight:      Height:        Intake/Output Summary (Last 24 hours) at 05/16/2019 1630 Last data filed at 05/16/2019 1300 Gross per 24 hour  Intake 2290.23 ml  Output --  Net 2290.23 ml   Filed Weights   05/15/19 1330  Weight: 106.1 kg    Physical Examination:  General exam: Appears calm and comfortable  Respiratory system: Clear to auscultation. Respiratory effort normal. Cardiovascular system: S1 & S2 heard, RRR. No JVD, murmurs, rubs, gallops or clicks. No pedal edema. Gastrointestinal system: Abdomen is nondistended, soft and nontender. Normal bowel sounds heard. Central nervous system: Alert and oriented. No new focal neurological deficits. Extremities: No contractures, edema.  See H&P for  right hallux/toe/foot wounds Skin:Nonhealing foot wounds as shown below along plantar aspect of right great toe, lateral aspect of fifth metatarsal head with surrounding cellulitis  Psychiatry: Judgement and insight appear normal. Mood & affect appropriate.    Data Reviewed: I have personally reviewed following labs and imaging studies  CBC: Recent Labs  Lab 05/15/19 1401 05/15/19 1627 05/16/19 0342  WBC 7.2 6.7 5.8  HGB 11.4* 10.4* 10.6*  HCT 35.2* 32.9* 32.3*  MCV 98.9 99.4 97.6  PLT 310 327 [id number]   Basic Metabolic Panel: Recent Labs  Lab 05/15/19 1401 05/16/19 0342  NA 137 138  K 3.7 3.8  CL 104 105  CO2 25 25  GLUCOSE 90 99  BUN 9 9  CREATININE 0.71 0.71  CALCIUM 9.4 8.8*   GFR: Estimated Creatinine Clearance: 94.5 mL/min (by C-G formula based on SCr of 0.71 mg/dL). Liver Function Tests: Recent Labs  Lab 05/16/19 0342  AST 16  ALT 15  ALKPHOS 64  BILITOT 0.6  PROT 6.3*  ALBUMIN 3.3*   No results for input(s): LIPASE, AMYLASE in the last 168 hours. No results for input(s): AMMONIA in the last 168 hours. Coagulation Profile: Recent Labs  Lab 05/16/19 0342  INR 1.1   Cardiac Enzymes: No results for input(s): CKTOTAL, CKMB, CKMBINDEX, TROPONINI in the last 168 hours. BNP (last 3 results) No results for input(s): PROBNP in the last 8760 hours. HbA1C: Recent Labs    05/15/19 1627  HGBA1C 5.2   CBG: Recent Labs  Lab 05/15/19 1755 05/15/19 2021 05/16/19 0740 05/16/19 1148  GLUCAP 72 100* 100* 108*   Lipid Profile:  No results for input(s): CHOL, HDL, LDLCALC, TRIG, CHOLHDL, LDLDIRECT in the last 72 hours. Thyroid Function Tests: No results for input(s): TSH, T4TOTAL, FREET4, T3FREE, THYROIDAB in the last 72 hours. Anemia Panel: No results for input(s): VITAMINB12, FOLATE, FERRITIN, TIBC, IRON, RETICCTPCT in the last 72 hours. Sepsis Labs: No results for input(s): PROCALCITON, LATICACIDVEN in the last 168 hours.  Recent Results (from the past  240 hour(s))  SARS CORONAVIRUS 2 (TAT 6-24 HRS) Nasopharyngeal Nasopharyngeal Swab     Status: None   Collection Time: 05/15/19  2:01 PM   Specimen: Nasopharyngeal Swab  Result Value Ref Range Status   SARS Coronavirus 2 NEGATIVE NEGATIVE Final    Comment: (NOTE) SARS-CoV-2 target nucleic acids are NOT DETECTED. The SARS-CoV-2 RNA is generally detectable in upper and lower respiratory specimens during the acute phase of infection. Negative results do not preclude SARS-CoV-2 infection, do not rule out co-infections with other pathogens, and should not be used as the sole basis for treatment or other patient management decisions. Negative results must be combined with clinical observations, patient history, and epidemiological information. The expected result is Negative. Fact Sheet for Patients: SugarRoll.be Fact Sheet for Healthcare Providers: https://www.woods-mathews.com/ This test is not yet approved or cleared by the [place] FDA and  has been authorized for detection and/or diagnosis of SARS-CoV-2 by FDA under an Emergency Use Authorization (EUA). This EUA will remain  in effect (meaning this test can be used) for the duration of the COVID-19 declaration under Section 56 4(b)(1) of the Act, 21 U.S.C. section 360bbb-3(b)(1), unless the authorization is terminated or revoked sooner. Performed at [facility], [place] [address], [place], [place] [address]   Blood culture (routine x 2)     Status: None (Preliminary result)   Collection Time: 05/15/19  2:45 PM   Specimen: BLOOD LEFT ARM  Result Value Ref Range Status   Specimen Description   Final    BLOOD LEFT ARM Performed at [facility] [address], [place], [place] [address]    Special Requests   Final    BOTTLES DRAWN AEROBIC AND ANAEROBIC Blood Culture adequate volume Performed at [facility] [address] [place], [place]  [address]    Culture   Final    NO GROWTH < 24 HOURS Performed at [facility] [address] [place], [place] [address]    Report Status PENDING  Incomplete  Blood culture (routine x 2)     Status: None (Preliminary result)   Collection Time: 05/15/19  2:45 PM   Specimen: BLOOD  Result Value Ref Range Status   Specimen Description   Final    BLOOD LEFT ANTECUBITAL Performed at [facility] [address], [place], [place] [address]    Special Requests   Final    BOTTLES DRAWN AEROBIC AND ANAEROBIC Blood Culture adequate volume Performed at [facility] [address], [place], [place] [address]    Culture  Setup Time   Final    GRAM POSITIVE COCCI IN CLUSTERS AEROBIC BOTTLE ONLY CRITICAL RESULT CALLED TO, READ BACK BY AND VERIFIED WITH: Shelda Jakes [id number] 05/16/19 A BROWNING Performed at [facility], [place] [address] [place], [place] [address]    Culture PENDING  Incomplete   Report Status PENDING  Incomplete      Radiology Studies: MR FOOT RIGHT W WO CONTRAST  Result Date: 05/16/2019 CLINICAL DATA:  Pain, swelling and suspected osteomyelitis.  EXAM: MRI OF THE RIGHT FOREFOOT WITHOUT AND WITH CONTRAST TECHNIQUE: Multiplanar, multisequence MR imaging of the right foot was performed before and after the administration of intravenous contrast. CONTRAST:  62mL GADAVIST GADOBUTROL 1 MMOL/ML IV SOLN COMPARISON:  Radiographs 05/15/2019 FINDINGS: Bones/Joint/Cartilage Prior amputation of the second toe except for the base of the proximal phalanx. Chronic appearing ankylosis at the interphalangeal joint of the great toe. There is an open wound along the lateral aspect of the forefoot at the level of the fifth metatarsal head. This appears to extend right down to the bone. There is associated osteomyelitis involving the distal shaft and head of the fifth metatarsal with surrounding inflammatory changes but no discrete drainable soft tissue  abscess. I do not see any definite findings for septic arthritis and the proximal phalanx appears intact. Diffuse T1 and T2 signal abnormality in the proximal and distal phalanges of the great toe and subsequent contrast enhancement consistent with osteomyelitis. Is also diffuse surrounding cellulitis but no discrete soft tissue abscess. The other bony structures are intact. I do not see any areas of septic arthritis or pyomyositis. IMPRESSION: 1. Open wound along the lateral aspect of the forefoot at the level of the fifth metatarsal head. There is underlying osteomyelitis involving the distal shaft and head of the fifth metatarsal. 2. Osteomyelitis involving proximal and distal phalanges of the great toe. 3. Cellulitis but no discrete soft tissue abscess. No findings for pyomyositis. 4. 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 Electronically Signed   By: Marijo Sanes M.D.   On: 05/16/2019 11:31   DG Foot Complete Right  Result Date: 05/15/2019 Please see detailed radiograph report in office note.       Scheduled Meds: . busPIRone  15 mg Oral TID  . DULoxetine  60 mg Oral QHS  . enoxaparin (LOVENOX) injection  40 mg Subcutaneous Q24H  . escitalopram  5 mg Oral Daily  . gabapentin  600 mg Oral TID  . haloperidol  5 mg Oral QHS  . insulin aspart  0-15 Units Subcutaneous TID WC  . metoprolol succinate  25 mg Oral Daily  . morphine  30 mg Oral Q12H  . traZODone  100 mg Oral QHS   Continuous Infusions: . lactated ringers 100 mL/hr at 05/16/19 0527  . vancomycin Stopped (05/16/19 [id number])    Assessment & Plan:   1. Right non healing infected foot wounds with osteomyelitis and surrounding cellulitis-continue IV vancomycin (per pharmacy consult).  Currently afebrile without leukocytosis.  Does not have peripheral sensations due to neuropathy.  MRI revealed- osteomyelitis involving the distal shaft and head of the fifth metatarsal as well as osteomyelitis  involving proximal and distal phalanges of the great toe.   Podiatry planning on operative intervention/amputation on Friday.  Blood cultures 1 out of 4 bottles reporting gram-positive cocci in clusters-ID pending. Dr. Earleen Newport to follow along while hospitalized.  Of note, patient has spinal implants/fusion hardware.  Will involve ID if blood culture final report reveals true staph aureus bacteremia.Resumed MS Contin per outpatient regimen, IV fentanyl for breakthrough pain.  Covid screen -ve.    2.  Depression/bipolar disorder: Patient on multiple psych medications including Cymbalta, buspirone, Lexapro and Haldol.  Obtain EKG for QT interval.  Resume home medications for now.  3.  Asthma: Saturating well on room air in no acute issues.  Resume MDI as needed  4.  History of migraines: No complaints of headache currently.  Supportive management as  needed.  5.  Prediabetes: Monitor blood glucose in the setting of acute infection.  Hemoglobin A1c at 5.2.  Diabetic diet.  6.  Spinal stenosis/chronic back pain: Patient underwent back surgeries and has implants.  Follows pain clinic and is on MS Contin chronically.  Resume at 30 mg twice daily and have IV fentanyl as needed available for breakthrough pain while here.  7.  Sleep apnea: Resume CPAP  8.  History of seizures: Patient not on any antiepileptics and reports tramadol induced seizures in the past.  9.  Obesity with BMI 40: Advise regarding diet modifications.  Follow-up PCP    DVT prophylaxis: Lovenox Code Status: Full code Family / Patient Communication: Discussed with patient Disposition Plan: TBD     LOS: 1 day    Time spent: 42 minutes    Guilford Shi, MD [facility] Pager [phone]  If 7PM-7AM, please contact night-coverage [email or web address] Password [facility] 05/16/2019, 4:30 PM

## 2019-05-16 NOTE — Progress Notes (Signed)
PHARMACY - PHYSICIAN COMMUNICATION CRITICAL VALUE ALERT - BLOOD CULTURE IDENTIFICATION (BCID)  [name] [name] is an 54 y.o. female who presented to [facility] on 05/15/2019 with a chief complaint of worsening leg wounds  Assessment:  Pt has PMH significant for chronic neuropathic foot ulcers, followed by podiatry. Sent to ED for worsening wounds with foul smelling drainage. BCID + 1/4 GPC in clusters in the aerobic bottle. No BCID panel at this time.  Name of physician (or Provider) Contacted: Dr. Earnest Conroy  Current antibiotics: Vancomycin  Changes to prescribed antibiotics: No change at this time. Continue vancomycin and follow cultures.  No results found for this or any previous visit.  Lenis Noon, PharmD 05/16/2019  3:18 PM

## 2019-05-16 NOTE — Consult Note (Signed)
Reason for Consult: Osteomyelitis, cellulitis Referring Physician: Dr. Guilford Shi, MD  [name] [name] is an 54 y.o. female.  HPI: 54 year old female presented to the hospital yesterday after she was seen in clinic for worsening cellulitis as well as concern for osteomyelitis.  She was started on IV antibiotics.  MRI was performed earlier today.  Overall she feels well and she denies any fevers, chills, nausea or vomiting.  No pain in the lower extremity.  No calf pain, chest pain, shortness of breath.  Past Medical History:  Diagnosis Date  . Anxiety   . Asthma    mild intermittent  . Bipolar 1 disorder ([place])    ect treatments last treatment Sep 02 1011  . Depression   . GERD (gastroesophageal reflux disease)   . Hypertension   . Pre-diabetes   . Seizures ([place])    last seizure was so long ago she can't remember.   . Sleep apnea    wears CPAP, uncertain of setting    Past Surgical History:  Procedure Laterality Date  . ABDOMINAL HYSTERECTOMY    . AMPUTATION TOE Left 02/02/2018   Procedure: AMPUTATION TOE Left 4th toe;  Surgeon: [name], DPM;  Location: [facility];  Service: Podiatry;  Laterality: Left;  . BACK SURGERY    . CARPAL TUNNEL RELEASE     x2  . Laproscopic knee surgery    . NECK SURGERY  03/15/2017  . PLANTAR FASCIA RELEASE     x2    Family History  Problem Relation Age of Onset  . Diabetes Mother   . COPD Mother   . Hypertension Mother   . Hyperlipidemia Mother   . Heart disease Father   . Hyperlipidemia Father   . Hypertension Father   . Cancer Father   . Heart disease Brother   . Heart disease Daughter   . Heart disease Maternal Grandmother   . Hypertension Maternal Grandmother   . Heart disease Maternal Grandfather   . Kidney cancer Paternal Grandmother   . Heart disease Paternal Grandfather     Social History:  reports that she has never smoked. She has never used smokeless tobacco. She reports that she does not drink alcohol or use  drugs.  Allergies:  Allergies  Allergen Reactions  . Penicillins Hives and Rash    Has patient had a PCN reaction causing immediate rash, facial/tongue/throat swelling, SOB or lightheadedness with hypotension: Yes Has patient had a PCN reaction causing severe rash involving mucus membranes or skin necrosis: No Has patient had a PCN reaction that required hospitalization: No Has patient had a PCN reaction occurring within the last 10 years: No If all of the above answers are "NO", then may proceed with Cephalosporin use.  . Tetracyclines & Related Other (See Comments)    Syncope and put her "in a coma"  . Tramadol Other (See Comments)    Seizures  . Phenazopyridine Other (See Comments)    Unknown  . Tetracycline Other (See Comments)    Syncope and "Put me in a coma"  . Ciprofloxacin Rash and Itching  . Codeine Itching and Rash  . Estradiol Rash    Patches broke out the skin    Medications: I have reviewed the patient's current medications.  Results for orders placed or performed during the hospital encounter of 05/15/19 (from the past 48 hour(s))  CBC     Status: Abnormal   Collection Time: 05/15/19  2:01 PM  Result Value Ref Range  WBC 7.2 4.0 - 10.5 K/uL   RBC 3.56 (L) 3.87 - 5.11 MIL/uL   Hemoglobin 11.4 (L) 12.0 - 15.0 g/dL   HCT 35.2 (L) 36.0 - 46.0 %   MCV 98.9 80.0 - 100.0 fL   MCH 32.0 26.0 - 34.0 pg   MCHC 32.4 30.0 - 36.0 g/dL   RDW 12.6 11.5 - 15.5 %   Platelets 310 150 - 400 K/uL   nRBC 0.0 0.0 - 0.2 %    Comment: Performed at [facility], [place] [address] [place], [place] [id number]  Basic metabolic panel     Status: None   Collection Time: 05/15/19  2:01 PM  Result Value Ref Range   Sodium 137 135 - 145 mmol/L   Potassium 3.7 3.5 - 5.1 mmol/L   Chloride 104 98 - 111 mmol/L   CO2 25 22 - 32 mmol/L   Glucose, Bld 90 70 - 99 mg/dL   BUN 9 6 - 20 mg/dL   Creatinine, Ser 0.71 0.44 - 1.00 mg/dL   Calcium 9.4 8.9 - 10.3 mg/dL   GFR calc  non Af Amer >60 >60 mL/min   GFR calc Af Amer >60 >60 mL/min   Anion gap 8 5 - 15    Comment: Performed at [facility], [place] [address], [place], [place] [address]  SARS CORONAVIRUS 2 (TAT 6-24 HRS) Nasopharyngeal Nasopharyngeal Swab     Status: None   Collection Time: 05/15/19  2:01 PM   Specimen: Nasopharyngeal Swab  Result Value Ref Range   SARS Coronavirus 2 NEGATIVE NEGATIVE    Comment: (NOTE) SARS-CoV-2 target nucleic acids are NOT DETECTED. The SARS-CoV-2 RNA is generally detectable in upper and lower respiratory specimens during the acute phase of infection. Negative results do not preclude SARS-CoV-2 infection, do not rule out co-infections with other pathogens, and should not be used as the sole basis for treatment or other patient management decisions. Negative results must be combined with clinical observations, patient history, and epidemiological information. The expected result is Negative. Fact Sheet for Patients: SugarRoll.be Fact Sheet for Healthcare Providers: https://www.woods-mathews.com/ This test is not yet approved or cleared by the [place] FDA and  has been authorized for detection and/or diagnosis of SARS-CoV-2 by FDA under an Emergency Use Authorization (EUA). This EUA will remain  in effect (meaning this test can be used) for the duration of the COVID-19 declaration under Section 56 4(b)(1) of the Act, 21 U.S.C. section 360bbb-3(b)(1), unless the authorization is terminated or revoked sooner. Performed at [facility], [place] [address], [place], [place] [address]   Blood culture (routine x 2)     Status: None (Preliminary result)   Collection Time: 05/15/19  2:45 PM   Specimen: BLOOD LEFT ARM  Result Value Ref Range   Specimen Description      BLOOD LEFT ARM Performed at [facility] [address], [place], [place] [address]    Special Requests      BOTTLES  DRAWN AEROBIC AND ANAEROBIC Blood Culture adequate volume Performed at [place] [address], [place], [place] [address]    Culture      NO GROWTH < 24 HOURS Performed at [facility] [address], [place],  [address]    Report Status PENDING   Blood culture (routine x 2)     Status: None (Preliminary result)   Collection Time: 05/15/19  2:45 PM   Specimen: BLOOD  Result Value Ref Range   Specimen Description      BLOOD LEFT ANTECUBITAL Performed at [facility] [address], [place], [place] [address]    Special Requests      BOTTLES DRAWN AEROBIC AND ANAEROBIC Blood Culture adequate volume Performed at [place] [address] [place], [place] [address]    Culture      NO GROWTH < 24 HOURS Performed at [facility] [address], [place], [place] [address]    Report Status PENDING   HIV Antibody (routine testing w rflx)     Status: None   Collection Time: 05/15/19  4:27 PM  Result Value Ref Range   HIV Screen 4th Generation wRfx NON REACTIVE NON REACTIVE    Comment: Performed at [facility], [place] [address], [place], [place] [address]  CBC     Status: Abnormal   Collection Time: 05/15/19  4:27 PM  Result Value Ref Range   WBC 6.7 4.0 - 10.5 K/uL   RBC 3.31 (L) 3.87 - 5.11 MIL/uL   Hemoglobin 10.4 (L) 12.0 - 15.0 g/dL   HCT 32.9 (L) 36.0 - 46.0 %   MCV 99.4 80.0 - 100.0 fL   MCH 31.4 26.0 - 34.0 pg   MCHC 31.6 30.0 - 36.0 g/dL   RDW 12.5 11.5 - 15.5 %   Platelets 327 150 - 400 K/uL   nRBC 0.0 0.0 - 0.2 %    Comment: Performed at [facility], [place] [address] [place], [place] [address]  Hemoglobin A1c     Status: None   Collection Time: 05/15/19  4:27 PM  Result Value Ref Range   Hgb A1c MFr Bld 5.2 4.8 - 5.6 %    Comment: (NOTE) Pre diabetes:          5.7%-6.4% Diabetes:              >6.4% Glycemic control for   <7.0% adults with diabetes     Mean Plasma Glucose 102.54 mg/dL    Comment: Performed at [facility] [address] [place], [place] [address]  Glucose, capillary     Status: None   Collection Time: 05/15/19  5:55 PM  Result Value Ref Range   Glucose-Capillary 72 70 - 99 mg/dL  Glucose, capillary     Status: Abnormal   Collection Time: 05/15/19  8:21 PM  Result Value Ref Range   Glucose-Capillary 100 (H) 70 - 99 mg/dL  Comprehensive metabolic panel     Status: Abnormal   Collection Time: 05/16/19  3:42 AM  Result Value Ref Range   Sodium 138 135 - 145 mmol/L   Potassium 3.8 3.5 - 5.1 mmol/L   Chloride 105 98 - 111 mmol/L   CO2 25 22 - 32 mmol/L   Glucose, Bld 99 70 - 99 mg/dL   BUN 9 6 - 20 mg/dL   Creatinine, Ser 0.71 0.44 - 1.00 mg/dL   Calcium 8.8 (L) 8.9 - 10.3 mg/dL   Total Protein 6.3 (L) 6.5 - 8.1 g/dL   Albumin 3.3 (L) 3.5 - 5.0 g/dL   AST 16 15 - 41 U/L   ALT 15 0 - 44 U/L   Alkaline Phosphatase 64 38 - 126 U/L   Total Bilirubin 0.6 0.3 - 1.2 mg/dL   GFR calc non Af Amer >60 >60 mL/min   GFR calc Af Amer >60 >60 mL/min   Anion gap 8 5 -  15    Comment: Performed at [facility], [place] [address] [place], [place] [address]  CBC     Status: Abnormal   Collection Time: 05/16/19  3:42 AM  Result Value Ref Range   WBC 5.8 4.0 - 10.5 K/uL   RBC 3.31 (L) 3.87 - 5.11 MIL/uL   Hemoglobin 10.6 (L) 12.0 - 15.0 g/dL   HCT 32.3 (L) 36.0 - 46.0 %   MCV 97.6 80.0 - 100.0 fL   MCH 32.0 26.0 - 34.0 pg   MCHC 32.8 30.0 - 36.0 g/dL   RDW 12.6 11.5 - 15.5 %   Platelets 280 150 - 400 K/uL   nRBC 0.0 0.0 - 0.2 %    Comment: Performed at [facility], [place] [address] [place], [place] [address]  Protime-INR     Status: None   Collection Time: 05/16/19  3:42 AM  Result Value Ref Range   Prothrombin Time 13.7 11.4 - 15.2 seconds   INR 1.1 0.8 - 1.2    Comment: (NOTE) INR goal varies based on device and disease states. Performed at [facility], [place] [address], [place], [place] [address]   Glucose, capillary     Status: Abnormal   Collection Time: 05/16/19  7:40 AM  Result Value Ref Range   Glucose-Capillary 100 (H) 70 - 99 mg/dL  Glucose, capillary     Status: Abnormal   Collection Time: 05/16/19 11:48 AM  Result Value Ref Range   Glucose-Capillary 108 (H) 70 - 99 mg/dL    MR FOOT RIGHT W WO CONTRAST  Result Date: 05/16/2019 CLINICAL DATA:  Pain, swelling and suspected osteomyelitis. EXAM: MRI OF THE RIGHT FOREFOOT WITHOUT AND WITH CONTRAST TECHNIQUE: Multiplanar, multisequence MR imaging of the right foot was performed before and after the administration of intravenous contrast. CONTRAST:  92mL GADAVIST GADOBUTROL 1 MMOL/ML IV SOLN COMPARISON:  Radiographs 05/15/2019 FINDINGS: Bones/Joint/Cartilage Prior amputation of the second toe except for the base of the proximal phalanx. Chronic appearing ankylosis at the interphalangeal joint of the great toe. There is an open wound along the lateral aspect of the forefoot at the level of the fifth metatarsal head. This appears to extend right down to the bone. There is associated osteomyelitis involving the distal shaft and head of the fifth metatarsal with surrounding inflammatory changes but no discrete drainable soft tissue abscess. I do not see any definite findings for septic arthritis and the proximal phalanx appears intact. Diffuse T1 and T2 signal abnormality in the proximal and distal phalanges of the great toe and subsequent contrast enhancement consistent with osteomyelitis. Is also diffuse surrounding cellulitis but no discrete soft tissue abscess. The other bony structures are intact. I do not see any areas of septic arthritis or pyomyositis. IMPRESSION: 1. Open wound along the lateral aspect of the forefoot at the level of the fifth metatarsal head. There is underlying osteomyelitis involving the distal shaft and head of the fifth metatarsal. 2. Osteomyelitis involving proximal and distal  phalanges of the great toe. 3. Cellulitis but no discrete soft tissue abscess. No findings for pyomyositis. 4. 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 Electronically Signed   By: Marijo Sanes M.D.   On: 05/16/2019 11:31   DG Foot Complete Right  Result Date: 05/15/2019 Please see detailed radiograph report in office note.   Review of Systems Blood pressure 122/76, pulse 73, temperature 97.8 F (36.6 C), temperature source Oral, resp.  rate 16, height 5\' 4"  (1.626 m), weight 106.1 kg, SpO2 99 %. Physical Exam General: AAO x3, NAD  Dermatological: Ulceration lateral aspect the foot on the fifth metatarsal head with surrounding erythema extending towards the dorsal aspect the foot but does appear to be somewhat improved compared to when I saw her in clinic yesterday.  There is no purulence identified.  There is no fluctuation or crepitation.  Also ulceration on the hallux with cellulitis to the toe itself but the edema and erythema mildly improved.  Mild warmth of the foot.  Vascular: Dorsalis Pedis artery and Posterior Tibial artery pedal pulses are 2/4 bilateral with immedate capillary fill time.There is no pain with calf compression, swelling, warmth, erythema.   Neruologic: Sensation absent   Musculoskeletal: Chronic deformity present to the hallux.  Previous second amputation.  Assessment/Plan: 54 year old female with osteomyelitis, cellulitis  -I reviewed the MRI with her.  Given osteomyelitis and history of amputation as well as digital deformity I do recommend a transmetatarsal amputation at this time.  She is amendable to this and she wants to go ahead and proceed with amputation.  We will plan for transmetatarsal amputation on Friday.  We discussed the surgical at the postoperative course.  We discussed all alternatives, risks, complications including, but not limited to spread of infection, further amputation, delayed or nonhealing as well as  general risks of surgery.  Also discussed blood transfusion if needed discussed risks of this and she is agreeable. -For now continue IV antibiotics.  She will be nonweightbearing postoperatively. -We will continue to follow.  [name] 05/16/2019, 1:52 PM  O: [phone] C: [phone]

## 2019-05-16 NOTE — Consult Note (Signed)
WOC Nurse Consult Note: Reason for Consult:Chronic, nonhealing wounds to right foot, plantar aspect of 1st metatarsal head, hallux and lateral aspect of 5th digit. Erythema, edema. Wound type:Neuropathic, infectious  Patient saw Dr. Jacqualyn Posey (Podiatry) yesterday morning and he advised admission for IV antibiotics and possible amputation. According to his note and the notes of the EDMD Dr. Jeanell Sparrow and admitting hospitalist Dr. Earnest Conroy, Dr. Jacqualyn Posey will consult and follow along while in house.   There is no role for WOC Nurse at this time.  I will defer to Dr. Jacqualyn Posey for topical wound care orders if they are needed.  [facility] nursing team will not follow, but will remain available to this patient, the nursing and medical teams.  Please re-consult if needed. Thanks, Maudie Flakes, MSN, RN, [place], Arther Abbott  Pager# [phone]

## 2019-05-17 ENCOUNTER — Ambulatory Visit: Payer: 59 | Admitting: Podiatry

## 2019-05-17 DIAGNOSIS — M86171 Other acute osteomyelitis, right ankle and foot: Principal | ICD-10-CM

## 2019-05-17 DIAGNOSIS — L02415 Cutaneous abscess of right lower limb: Secondary | ICD-10-CM

## 2019-05-17 LAB — GLUCOSE, CAPILLARY
Glucose-Capillary: 88 mg/dL (ref 70–99)
Glucose-Capillary: 93 mg/dL (ref 70–99)
Glucose-Capillary: 88 mg/dL (ref 70–99)
Glucose-Capillary: 101 mg/dL — ABNORMAL HIGH (ref 70–99)

## 2019-05-17 LAB — CULTURE, BLOOD (ROUTINE X 2): Special Requests: ADEQUATE

## 2019-05-17 MED ORDER — CHLORHEXIDINE GLUCONATE CLOTH 2 % EX PADS
6.0000 | MEDICATED_PAD | Freq: Once | CUTANEOUS | Status: AC
  Administered 2019-05-18: 6 via TOPICAL

## 2019-05-17 MED ORDER — CHLORHEXIDINE GLUCONATE CLOTH 2 % EX PADS
6.0000 | MEDICATED_PAD | Freq: Once | CUTANEOUS | Status: AC
  Administered 2019-05-17: 6 via TOPICAL

## 2019-05-17 MED ORDER — CHLORHEXIDINE GLUCONATE CLOTH 2 % EX PADS: 6.0000 | MEDICATED_PAD | Freq: Once | CUTANEOUS | Status: DC

## 2019-05-17 NOTE — Progress Notes (Signed)
Subjective: [name] [name] was admitted to the hospital for worsening cellulitis as well as concern for osteomyelitis and failure of oral antibiotics.  She was started on IV antibiotics and MRI was performed which revealed osteomyelitis.  She is scheduled for transmetatarsal amputation on Friday morning.  She denies any pain to her foot and she denies any fevers, chills, nausea vomiting.  No calf pain, chest pain, shortness of breath.  Objective: AAO x3, NAD DP/PT pulses palpable bilaterally, CRT less than 3 seconds Ulceration continues to the hallux as well as the fifth metatarsal head laterally.  There is decreased cellulitis to the foot.  Minimal swelling to the foot as well as decreased warmth.  There is no fluctuation crepitation.  No malodor. No pain with calf compression, swelling, warmth, erythema  Assessment: Osteomyelitis  Plan:  -Cellulitis is much improved.  Likely plan for transmetatarsal amputation with hopeful closure tomorrow morning.  She is n.p.o. after midnight.  We can discuss the surgery as well as the postoperative course.  She has no further questions or concerns. -We will be nonweightbearing postoperatively in a cam boot. -Continue IV antibiotics for now -Awaiting blood cultures

## 2019-05-17 NOTE — Progress Notes (Signed)
PROGRESS NOTE  [name] [name]  DOB: Jun 20, 1965  PCP: [name], MD TQ:[id number]  DOA: 05/15/2019 Admitted From: Home  LOS: 2 days   Chief Complaint  Patient presents with  . Foot Pain    rt   Brief narrative: 54 y.o.femalewith history h/oHTN, pre diabetes, asthma, GERD, depression, sleep apnea on CPAP, chronic neuropathic foot ulcers followed by Podiatry Dr Jacqualyn Posey. 1/26, patient presented to podiatry clinic with worsening foot wounds/foul smelling drainage and surrounding redness. Patient was followed by Dr Jacqualyn Posey as outpatient andwound cultures from 1/12 grew MSSA. Several antibiotics were tried based on sensitivityand then switched either due tointolerance or due to inadequate response. She failed outpatient management with local dressings/oral antibiotics. Given worsening symptoms and concern for osteomyelitis, patientwas referred to ED for admission/MRI/IV antibiotics and possible operative intervention/amputation. Patient was admitted under hospitalist medicine service. Podiatry consulted.  Tentative plan for operative intervention on 1/29.  Subjective: Patient was seen and examined this morning.  Pleasant middle-aged Caucasian female.  Lying in bed.  Not in distress.  Pain controlled.  No new symptoms.  Assessment/Plan: Right non healing infected foot wounds with osteomyelitis and surrounding cellulitis -Failed outpatient treatment with oral antibiotics. -No WBC count.  No fever. -MRI showed osteomyelitis involving the distal shaft and head of the fifth metatarsal as well as osteomyelitis involving proximal and distal phalanges of the great toe.  - podiatry consultation obtained.  Tentative plan of surgical intervention on 1/29. -Continue as needed MS Contin for pain.  Resume Neurontin.  Coagulase-negative staph aureus in blood -Blood cultures 1 out of 4 bottles reporting coagulase-negative staph aureus.  -Suspect contamination.  However need to repeat  blood culture.    Essential hypertension -Continue metoprolol  Prediabetes -A1c 5.2  GERD -Protonix.  Depression/bipolar disorder:  -Patient on multiple psych medications including Cymbalta, buspirone, Lexapro and Haldol. Continue all.  History of migraines:Continue Topamax.  Also takes monthly injection of Aimovig.  Spinal stenosis/chronic back pain: -Patient underwent back surgeries and has implants. Follows up at pain clinic. -Chronically on MS Contin, Neurontin, Flexeril.  -All resumed.  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  Morbid obesity - Body mass index is 40.17 kg/m. Patient has been advised to make an attempt to improve diet and exercise patterns to aid in weight loss.  Mobility: Encourage ambulation Diet: Cardiac diet Fluid: LR at 100 currently DVT prophylaxis:  Lovenox subcu Code Status:  Full code Family Communication:  None at bedside Expected Discharge:  Plan for tomorrow  Consultants:  Podiatry  Procedures:    Antimicrobials: Anti-infectives (From admission, onward)   Start     Dose/Rate Route Frequency Ordered Stop   05/16/19 0500  vancomycin (VANCOREADY) IVPB 750 mg/150 mL     750 mg 150 mL/hr over 60 Minutes Intravenous Every 12 hours 05/15/19 1640     05/15/19 1645  vancomycin (VANCOCIN) IVPB 1000 mg/200 mL premix     1,000 mg 200 mL/hr over 60 Minutes Intravenous  Once 05/15/19 1640 05/15/19 1857   05/15/19 1445  vancomycin (VANCOCIN) IVPB 1000 mg/200 mL premix     1,000 mg 200 mL/hr over 60 Minutes Intravenous  Once 05/15/19 1435 05/15/19 1617        Code Status: Full Code   Diet Order            Diet heart healthy/carb modified Room service appropriate? Yes; Fluid consistency: Thin  Diet effective now  Infusions:  . lactated ringers 100 mL/hr at 05/17/19 0913  . vancomycin 750 mg (05/17/19 0446)    Scheduled  Meds: . busPIRone  15 mg Oral TID  . DULoxetine  60 mg Oral QHS  . enoxaparin (LOVENOX) injection  40 mg Subcutaneous Q24H  . escitalopram  5 mg Oral Daily  . gabapentin  600 mg Oral TID  . haloperidol  5 mg Oral QHS  . insulin aspart  0-15 Units Subcutaneous TID WC  . metoprolol succinate  25 mg Oral Daily  . morphine  30 mg Oral Q12H  . traZODone  100 mg Oral QHS    PRN meds: acetaminophen **OR** acetaminophen, fentaNYL (SUBLIMAZE) injection   Objective: Vitals:   05/17/19 0535 05/17/19 0553  BP: 114/71   Pulse: 63   Resp: 16   Temp: 97.8 F (36.6 C)   SpO2: 91% 95%    Intake/Output Summary (Last 24 hours) at 05/17/2019 1314 Last data filed at 05/17/2019 0930 Gross per 24 hour  Intake 1749.07 ml  Output --  Net 1749.07 ml   Filed Weights   05/15/19 1330  Weight: 106.1 kg   Weight change:  Body mass index is 40.17 kg/m.   Physical Exam: General exam: Appears calm and comfortable.  Skin: No rashes, lesions or ulcers. HEENT: Atraumatic, normocephalic, supple neck, no obvious bleeding Lungs: Clear to auscultation bilaterally CVS: Regular rate and rhythm, no murmur GI/Abd soft, nontender, nondistended, bowel sound present CNS: Alert, awake, oriented x3 Psychiatry: Mood appropriate Extremities: No edema, no calf tenderness, right foot has bandage on.  Data Review: I have personally reviewed the laboratory data and studies available.  Recent Labs  Lab 05/15/19 1401 05/15/19 1627 05/16/19 0342  WBC 7.2 6.7 5.8  HGB 11.4* 10.4* 10.6*  HCT 35.2* 32.9* 32.3*  MCV 98.9 99.4 97.6  PLT 310 327 280   Recent Labs  Lab 05/15/19 1401 05/16/19 0342  NA 137 138  K 3.7 3.8  CL 104 105  CO2 25 25  GLUCOSE 90 99  BUN 9 9  CREATININE 0.71 0.71  CALCIUM 9.4 8.8*    Terrilee Croak, MD  [facility] 05/17/2019

## 2019-05-18 ENCOUNTER — Inpatient Hospital Stay (HOSPITAL_COMMUNITY): Payer: 59

## 2019-05-18 ENCOUNTER — Inpatient Hospital Stay (HOSPITAL_COMMUNITY): Payer: 59 | Admitting: Physician Assistant

## 2019-05-18 ENCOUNTER — Encounter (HOSPITAL_COMMUNITY)
Admission: EM | Disposition: A | Discharge status: Home Health | Payer: Self-pay | Source: Home / Self Care | Attending: Internal Medicine

## 2019-05-18 DIAGNOSIS — L02415 Cutaneous abscess of right lower limb: Secondary | ICD-10-CM

## 2019-05-18 HISTORY — PX: TRANSMETATARSAL AMPUTATION: SHX6197

## 2019-05-18 LAB — GLUCOSE, CAPILLARY
Glucose-Capillary: 90 mg/dL (ref 70–99)
Glucose-Capillary: 83 mg/dL (ref 70–99)
Glucose-Capillary: 106 mg/dL — ABNORMAL HIGH (ref 70–99)
Glucose-Capillary: 165 mg/dL — ABNORMAL HIGH (ref 70–99)
Glucose-Capillary: 107 mg/dL — ABNORMAL HIGH (ref 70–99)

## 2019-05-18 LAB — MRSA PCR SCREENING: MRSA by PCR: NEGATIVE

## 2019-05-18 SURGERY — AMPUTATION, FOOT, TRANSMETATARSAL
Anesthesia: General | Site: Toe | Laterality: Right

## 2019-05-18 MED ORDER — FENTANYL CITRATE (PF) 100 MCG/2ML IJ SOLN
INTRAMUSCULAR | Status: AC
  Administered 2019-05-18: 50 ug via INTRAVENOUS
  Filled 2019-05-18: qty 2

## 2019-05-18 MED ORDER — ONDANSETRON HCL 4 MG/2ML IJ SOLN
4.0000 mg | Freq: Once | INTRAMUSCULAR | Status: AC | PRN
  Administered 2019-05-18: 4 mg via INTRAVENOUS

## 2019-05-18 MED ORDER — BUPIVACAINE HCL (PF) 0.5 % IJ SOLN
INTRAMUSCULAR | Status: AC
  Filled 2019-05-18: qty 30

## 2019-05-18 MED ORDER — FENTANYL CITRATE (PF) 100 MCG/2ML IJ SOLN: 25.0000 ug | INTRAMUSCULAR | Status: DC | PRN

## 2019-05-18 MED ORDER — ONDANSETRON HCL 4 MG/2ML IJ SOLN
INTRAMUSCULAR | Status: DC | PRN
  Administered 2019-05-18: 4 mg via INTRAVENOUS

## 2019-05-18 MED ORDER — 0.9 % SODIUM CHLORIDE (POUR BTL) OPTIME
TOPICAL | Status: DC | PRN
  Administered 2019-05-18: 1000 mL

## 2019-05-18 MED ORDER — VANCOMYCIN HCL 1000 MG IV SOLR
INTRAVENOUS | Status: AC
  Filled 2019-05-18: qty 1000

## 2019-05-18 MED ORDER — DEXAMETHASONE SODIUM PHOSPHATE 4 MG/ML IJ SOLN
INTRAMUSCULAR | Status: DC | PRN
  Administered 2019-05-18: 5 mg via INTRAVENOUS

## 2019-05-18 MED ORDER — SUMATRIPTAN SUCCINATE 50 MG PO TABS
50.0000 mg | ORAL_TABLET | ORAL | Status: DC | PRN
  Administered 2019-05-18: 50 mg via ORAL
  Filled 2019-05-18 (×3): qty 1

## 2019-05-18 MED ORDER — OXYCODONE HCL 5 MG/5ML PO SOLN: 5.0000 mg | Freq: Once | ORAL | Status: DC | PRN

## 2019-05-18 MED ORDER — DEXAMETHASONE SODIUM PHOSPHATE 10 MG/ML IJ SOLN
INTRAMUSCULAR | Status: AC
  Filled 2019-05-18: qty 1

## 2019-05-18 MED ORDER — MIDAZOLAM HCL 2 MG/2ML IJ SOLN
INTRAMUSCULAR | Status: AC
  Filled 2019-05-18: qty 2

## 2019-05-18 MED ORDER — PROPOFOL 10 MG/ML IV BOLUS
INTRAVENOUS | Status: DC | PRN
  Administered 2019-05-18: 200 mg via INTRAVENOUS

## 2019-05-18 MED ORDER — PHENYLEPHRINE 40 MCG/ML (10ML) SYRINGE FOR IV PUSH (FOR BLOOD PRESSURE SUPPORT)
PREFILLED_SYRINGE | INTRAVENOUS | Status: AC
  Filled 2019-05-18: qty 10

## 2019-05-18 MED ORDER — ONDANSETRON HCL 4 MG/2ML IJ SOLN
INTRAMUSCULAR | Status: AC
  Filled 2019-05-18: qty 2

## 2019-05-18 MED ORDER — BUPIVACAINE-EPINEPHRINE (PF) 0.5% -1:200000 IJ SOLN
INTRAMUSCULAR | Status: DC | PRN
  Administered 2019-05-18: 30 mL via PERINEURAL

## 2019-05-18 MED ORDER — FENTANYL CITRATE (PF) 100 MCG/2ML IJ SOLN
INTRAMUSCULAR | Status: DC | PRN
  Administered 2019-05-18 (×2): 50 ug via INTRAVENOUS

## 2019-05-18 MED ORDER — EPHEDRINE SULFATE-NACL 50-0.9 MG/10ML-% IV SOSY
PREFILLED_SYRINGE | INTRAVENOUS | Status: DC | PRN
  Administered 2019-05-18 (×2): 10 mg via INTRAVENOUS
  Administered 2019-05-18: 5 mg via INTRAVENOUS

## 2019-05-18 MED ORDER — LIDOCAINE 2% (20 MG/ML) 5 ML SYRINGE
INTRAMUSCULAR | Status: DC | PRN
  Administered 2019-05-18: 60 mg via INTRAVENOUS

## 2019-05-18 MED ORDER — PROPOFOL 10 MG/ML IV BOLUS
INTRAVENOUS | Status: AC
  Filled 2019-05-18: qty 20

## 2019-05-18 MED ORDER — LIDOCAINE 2% (20 MG/ML) 5 ML SYRINGE
INTRAMUSCULAR | Status: AC
  Filled 2019-05-18: qty 5

## 2019-05-18 MED ORDER — OXYCODONE HCL 5 MG PO TABS: 5.0000 mg | ORAL_TABLET | Freq: Once | ORAL | Status: DC | PRN

## 2019-05-18 MED ORDER — MIDAZOLAM HCL 2 MG/2ML IJ SOLN
INTRAMUSCULAR | Status: AC
  Administered 2019-05-18: 2 mg via INTRAVENOUS
  Filled 2019-05-18: qty 2

## 2019-05-18 MED ORDER — MIDAZOLAM HCL 2 MG/2ML IJ SOLN
1.0000 mg | INTRAMUSCULAR | Status: DC
  Administered 2019-05-18: 2 mg via INTRAVENOUS

## 2019-05-18 MED ORDER — PHENYLEPHRINE 40 MCG/ML (10ML) SYRINGE FOR IV PUSH (FOR BLOOD PRESSURE SUPPORT)
PREFILLED_SYRINGE | INTRAVENOUS | Status: DC | PRN
  Administered 2019-05-18: 160 ug via INTRAVENOUS
  Administered 2019-05-18 (×2): 80 ug via INTRAVENOUS

## 2019-05-18 MED ORDER — EPHEDRINE 5 MG/ML INJ
INTRAVENOUS | Status: AC
  Filled 2019-05-18: qty 10

## 2019-05-18 MED ORDER — LACTATED RINGERS IV SOLN: INTRAVENOUS | Status: DC

## 2019-05-18 MED ORDER — FENTANYL CITRATE (PF) 100 MCG/2ML IJ SOLN: 50.0000 ug | INTRAMUSCULAR | Status: DC

## 2019-05-18 MED ORDER — FENTANYL CITRATE (PF) 100 MCG/2ML IJ SOLN
INTRAMUSCULAR | Status: AC
  Filled 2019-05-18: qty 2

## 2019-05-18 SURGICAL SUPPLY — 42 items
BLADE SURG 15 STRL LF DISP TIS (BLADE) ×1 IMPLANT
BLADE SURG 15 STRL SS (BLADE) ×2
BNDG ELASTIC 4X5.8 VLCR STR LF (GAUZE/BANDAGES/DRESSINGS) ×2 IMPLANT
BNDG ELASTIC 6X5.8 VLCR STR LF (GAUZE/BANDAGES/DRESSINGS) ×2 IMPLANT
BNDG GAUZE ELAST 4 BULKY (GAUZE/BANDAGES/DRESSINGS) ×1 IMPLANT
CLEANER TIP ELECTROSURG 2X2 (MISCELLANEOUS) ×2 IMPLANT
CONT SPEC 4OZ CLIKSEAL STRL BL (MISCELLANEOUS) ×2 IMPLANT
COVER MAYO STAND STRL (DRAPES) ×2 IMPLANT
COVER SURGICAL LIGHT HANDLE (MISCELLANEOUS) ×2 IMPLANT
COVER WAND RF STERILE (DRAPES) IMPLANT
CUFF TOURN SGL QUICK 24 (TOURNIQUET CUFF) ×2
CUFF TRNQT CYL 24X4X16.5-23 (TOURNIQUET CUFF) ×1 IMPLANT
DECANTER SPIKE VIAL GLASS SM (MISCELLANEOUS) ×2 IMPLANT
DRSG EMULSION OIL 3X16 NADH (GAUZE/BANDAGES/DRESSINGS) ×1 IMPLANT
GAUZE SPONGE 4X4 12PLY STRL (GAUZE/BANDAGES/DRESSINGS) ×5 IMPLANT
GLOVE BIO SURGEON STRL SZ7.5 (GLOVE) ×2 IMPLANT
GLOVE BIOGEL PI IND STRL 8 (GLOVE) ×1 IMPLANT
GLOVE BIOGEL PI INDICATOR 8 (GLOVE) ×1
GOWN STRL REUS W/TWL XL LVL3 (GOWN DISPOSABLE) ×2 IMPLANT
KIT BASIN OR (CUSTOM PROCEDURE TRAY) ×2 IMPLANT
KIT TURNOVER KIT A (KITS) IMPLANT
MARKER SKIN DUAL TIP RULER LAB (MISCELLANEOUS) ×2 IMPLANT
NDL HYPO 25X1 1.5 SAFETY (NEEDLE) ×1 IMPLANT
NEEDLE HYPO 25X1 1.5 SAFETY (NEEDLE) ×2 IMPLANT
PACK ORTHO EXTREMITY (CUSTOM PROCEDURE TRAY) ×2 IMPLANT
PADDING UNDERCAST 2 STRL (CAST SUPPLIES) ×1
PADDING UNDERCAST 2X4 STRL (CAST SUPPLIES) ×1 IMPLANT
PENCIL SMOKE EVACUATOR (MISCELLANEOUS) ×1 IMPLANT
STAPLER VISISTAT 35W (STAPLE) ×1 IMPLANT
SUT MNCRL AB 3-0 PS2 18 (SUTURE) ×1 IMPLANT
SUT PROLENE 3 0 PS 2 (SUTURE) ×2 IMPLANT
SUT PROLENE 4 0 PS 2 18 (SUTURE) ×2 IMPLANT
SUT SILK 2 0 (SUTURE) ×2
SUT SILK 2-0 18XBRD TIE 12 (SUTURE) IMPLANT
SUT VIC AB 1 CT1 27 (SUTURE) ×2
SUT VIC AB 1 CT1 27XBRD ANTBC (SUTURE) ×1 IMPLANT
SYR 20ML LL LF (SYRINGE) ×2 IMPLANT
TOWEL OR 17X26 10 PK STRL BLUE (TOWEL DISPOSABLE) ×2 IMPLANT
TOWEL OR NON WOVEN STRL DISP B (DISPOSABLE) ×2 IMPLANT
TRAY PREP A LATEX SAFE STRL (SET/KITS/TRAYS/PACK) ×2 IMPLANT
UNDERPAD 30X36 HEAVY ABSORB (UNDERPADS AND DIAPERS) ×8 IMPLANT
YANKAUER SUCT BULB TIP 10FT TU (MISCELLANEOUS) ×1 IMPLANT

## 2019-05-18 NOTE — Op Note (Signed)
PATIENT:  [name] [name]  54 y.o. female  PRE-OPERATIVE DIAGNOSIS:  osteomyelitis  POST-OPERATIVE DIAGNOSIS:  osteomyelitis  PROCEDURE:  Procedure(s): TRANSMETATARSAL AMPUTATION (Right)  SURGEON:  Surgeon(s) and Role:    [name], DPM - Primary  PHYSICIAN ASSISTANT:   ASSISTANTS: none   ANESTHESIA:   general  EBL:  100 mL   BLOOD ADMINISTERED:none  DRAINS: (1) Jackson-Pratt drain(s) with closed bulb suction in the right foot (19 french)   LOCAL MEDICATIONS USED:  NONE  SPECIMEN:  Source of Specimen:  TMA for pathology; bone culture 5th metatarsal  DISPOSITION OF SPECIMEN:  PATHOLOGY  COUNTS:  YES  TOURNIQUET:   Total Tourniquet Time Documented: Calf (Right) - 16 minutes Total: Calf (Right) - 16 minutes   DICTATION: .Viviann Spare Dictation  PLAN OF CARE: Admit to inpatient   PATIENT DISPOSITION:  PACU - hemodynamically stable.   Delay start of Pharmacological VTE agent (>24hrs) due to surgical blood loss or risk of bleeding: no  Indications for surgery: [name] presented to the hospital due to failure of oral antibiotics and worsening cellulitis and concern for osteomyelitis.  An MRI was performed which did reveal osteomyelitis of the fifth metatarsal as well as the hallux.  She is also had a previous second toe amputation.  Given this I recommended a transmetatarsal amputation.  We discussed the procedure as well as the postoperative course including all alternatives, risks, complications.  At this time she wishes to go to proceed with a transmetatarsal amputation.  All questions were answered.   Surgery in detail: The patient was both verbally and visually identified by myself, the nursing staff, and the anesthesia staff in the preoperative holding area.  She had a popliteal block performed by the anesthesia staff.  She was transferred the operating room via stretcher and placed on the operative table in supine position.  A well-padded  ankle tourniquet was applied.  The right lower extremity was then scrubbed, prepped, draped in the normal sterile fashion.  Appropriate timeout was performed.  An incision was planned for transmetatarsal imitation of the right foot.  The incision was made with #10 blade scalpel through the epidermis and dermis.  I utilized electrocautery for further dissection.  She did not have adequate blood flow due to bleeding I did inflate the tourniquet at this time.  At this time dissection was then carried down to the level of the metatarsals make sure to provide hemostasis during the procedure utilizing both electrocautery as well as ligation.  At this time the metatarsals were identified and elevator was utilized to remove the soft tissue.  A sagittal bone saw was utilized to resect the metatarsals 1 through 5.  At this time plantar incision was completed in order to disarticulate the forefoot.  Upon further inspection there is no purulence identified.  I debrided all nonviable tissue that was present.  Upon further evaluation it appeared that the remainder metatarsals all appear to be hard in nature and white in color particular the fifth metatarsal.  However at this time I did take a piece of the fifth metatarsal that appeared to be clean and I sent this for microbiology.  At this time hemostasis was achieved and the incision was irrigated.  The tourniquet was deflated.  Hemostasis was then achieved.  After copious irrigation the incision was then closed with 3-0 Prolene as well as skin staples.  A 19 [place] JP drain was also placed.  Adaptic was placed over the incision followed by a  dry sterile dressing.  She was awoken from anesthesia and found to tolerate the procedure well without any complications.  Postoperative course she is remained inpatient and continue on antibiotics for now.  Await culture in the fifth metatarsal as well as blood cultures.  She will be nonweightbearing postoperatively.

## 2019-05-18 NOTE — Anesthesia Procedure Notes (Signed)
Procedure Name: LMA Insertion Date/Time: 05/18/2019 10:14 AM Performed by: Claudia Desanctis, CRNA Pre-anesthesia Checklist: Emergency Drugs available, Patient identified, Suction available and Patient being monitored Patient Re-evaluated:Patient Re-evaluated prior to induction Oxygen Delivery Method: Circle system utilized Preoxygenation: Pre-oxygenation with 100% oxygen Induction Type: IV induction Ventilation: Mask ventilation without difficulty LMA: LMA inserted LMA Size: 4.0 Number of attempts: 1 Placement Confirmation: positive ETCO2 and breath sounds checked- equal and bilateral Tube secured with: Tape Dental Injury: Teeth and Oropharynx as per pre-operative assessment

## 2019-05-18 NOTE — Progress Notes (Signed)
Patient was seen in pre-op. Scheduled for right foot TMA. Went over consent form again and she has no questions. Wants me to call her mom postop and provided the number.   Cellulites much improved. Will plan for right TMA as scheduled with likely closure.   Celesta Gentile, DPM O: [phone] C: [phone]

## 2019-05-18 NOTE — Transfer of Care (Signed)
Immediate Anesthesia Transfer of Care Note  Patient: [name] [name]  Procedure(s) Performed: TRANSMETATARSAL AMPUTATION (Right Toe)  Patient Location: PACU  Anesthesia Type:GA combined with regional for post-op pain  Level of Consciousness: awake and patient cooperative  Airway & Oxygen Therapy: Patient Spontanous Breathing and Patient connected to face mask  Post-op Assessment: Report given to RN and Post -op Vital signs reviewed and stable  Post vital signs: Reviewed and stable  Last Vitals:  Vitals Value Taken Time  BP 126/80 05/18/19 1132  Temp 36.4 C 05/18/19 1132  Pulse 78 05/18/19 1136  Resp 15 05/18/19 1136  SpO2 100 % 05/18/19 1136  Vitals shown include unvalidated device data.  Last Pain:  Vitals:   05/18/19 0818  TempSrc: Oral  PainSc:       Patients Stated Pain Goal: 2 ([phone] [id number])  Complications: No apparent anesthesia complications

## 2019-05-18 NOTE — Anesthesia Procedure Notes (Signed)
Anesthesia Regional Block: Popliteal block   Pre-Anesthetic Checklist: ,, timeout performed, Correct Patient, Correct Site, Correct Laterality, Correct Procedure, Correct Position, site marked, Risks and benefits discussed,  Surgical consent,  Pre-op evaluation,  At surgeon's request and post-op pain management  Laterality: Right  Prep: chloraprep       Needles:  Injection technique: Single-shot  Needle Type: Echogenic Stimulator Needle          Additional Needles:   Procedures:, nerve stimulator,,,,,,,   Nerve Stimulator or Paresthesia:  Response: plantar flexion of foot, 0.45 mA,   Additional Responses:   Narrative:  Start time: 05/18/2019 9:02 AM End time: 05/18/2019 9:12 AM Injection made incrementally with aspirations every 5 mL.  Performed by: Personally  Anesthesiologist: Albertha Ghee, MD  Additional Notes: Functioning IV was confirmed and monitors were applied.  A 68mm 21ga Arrow echogenic stimulator needle was used. Sterile prep and drape,hand hygiene and sterile gloves were used.  Negative aspiration and negative test dose prior to incremental administration of local anesthetic. The patient tolerated the procedure well.  Ultrasound guidance: relevent anatomy identified, needle position confirmed, local anesthetic spread visualized around nerve(s), vascular puncture avoided.  Image printed for medical record.

## 2019-05-18 NOTE — Progress Notes (Signed)
Assisted Dr. Hodierne with right, ultrasound guided, popliteal block. Side rails up, monitors on throughout procedure. See vital signs in flow sheet. Tolerated Procedure well. 

## 2019-05-18 NOTE — Brief Op Note (Signed)
05/18/2019  11:30 AM  PATIENT:  [name] [name]  54 y.o. female  PRE-OPERATIVE DIAGNOSIS:  osteomyelitis  POST-OPERATIVE DIAGNOSIS:  osteomyelitis  PROCEDURE:  Procedure(s): TRANSMETATARSAL AMPUTATION (Right)  SURGEON:  Surgeon(s) and Role:    [name], DPM - Primary  PHYSICIAN ASSISTANT:   ASSISTANTS: none   ANESTHESIA:   general  EBL:  100 mL   BLOOD ADMINISTERED:none  DRAINS: (1) Jackson-Pratt drain(s) with closed bulb suction in the right foot (19 french)   LOCAL MEDICATIONS USED:  NONE  SPECIMEN:  Source of Specimen:  TMA for pathology; bone culture 5th metatarsal  DISPOSITION OF SPECIMEN:  PATHOLOGY  COUNTS:  YES  TOURNIQUET:   Total Tourniquet Time Documented: Calf (Right) - 16 minutes Total: Calf (Right) - 16 minutes   DICTATION: .Viviann Spare Dictation  PLAN OF CARE: Admit to inpatient   PATIENT DISPOSITION:  PACU - hemodynamically stable.   Delay start of Pharmacological VTE agent (>24hrs) due to surgical blood loss or risk of bleeding: no  Intraoperative findings: No abscess noted. Remaining bone appeared viable. Clean margin bone removed from the 5th metatarsal and sent to microbiology in a dry container. NWB postop. Would recommend home health for drain care if able and case management referral placed. PT ordered. Awaiting blood culture and bone culture of the 5th metatarsal.

## 2019-05-18 NOTE — Progress Notes (Signed)
Pharmacy Antibiotic Note  LESHELL GENGO is a 54 y.o. female with right foot wound/ulcerations/cellulitis on bactrim PTA who was instructed by her podiatrist Dr. Jacqualyn Posey to come to the ED on 1/26 for evaluation and management of worsening foot cellulitis with concern for osteomyelitis. Pharmacy was consulted to start vancomycin for infection.  - Serum creatinine stable at 0.71 with an estimated CrCl 95 ml/min - Right transmetatarsal amputation today   Plan: Continue vancomycin 750 mg q12h (est AUC 461) Monitor clinical response, renal function, vancomycin levels if clinically indicated and therapy is determined to be continued greater than 2 more days F/U post op course, cultures, LOT ____________________________________  Height: 5\' 4"  (162.6 cm) Weight: 234 lb (106.1 kg) IBW/kg (Calculated) : 54.7  Temp (24hrs), Avg:98 F (36.7 C), Min:97.6 F (36.4 C), Max:98.3 F (36.8 C)  Recent Labs  Lab 05/15/19 1401 05/15/19 1627 05/16/19 0342  WBC 7.2 6.7 5.8  CREATININE 0.71  --  0.71    Estimated Creatinine Clearance: 94.5 mL/min (by C-G formula based on SCr of 0.71 mg/dL).    Allergies  Allergen Reactions  . Penicillins Hives and Rash    Has patient had a PCN reaction causing immediate rash, facial/tongue/throat swelling, SOB or lightheadedness with hypotension: Yes Has patient had a PCN reaction causing severe rash involving mucus membranes or skin necrosis: No Has patient had a PCN reaction that required hospitalization: No Has patient had a PCN reaction occurring within the last 10 years: No If all of the above answers are "NO", then may proceed with Cephalosporin use.  . Tetracyclines & Related Other (See Comments)    Syncope and put her "in a coma"  . Tramadol Other (See Comments)    Seizures  . Phenazopyridine Other (See Comments)    Unknown  . Tetracycline Other (See Comments)    Syncope and "Put me in a coma"  . Ciprofloxacin Rash and Itching  . Codeine Itching  and Rash  . Estradiol Rash    Patches broke out the skin   Antimicrobials this admission:  1/26 vanc>>  Microbiology results:  1/12 right foot wound: MSSA 1/26 BCx x2: 1/4 GPC in clusters, no BCID panel ran 1/28 blood cx: NGTD  Thank you for allowing pharmacy to be a part of this patient's care.  Efraim Kaufmann, PharmD, BCPS 05/18/2019 1:52 PM

## 2019-05-18 NOTE — Progress Notes (Addendum)
PROGRESS NOTE  [name] [name]  DOB: 12/12/1965  PCP: [name] Frizzle, MD [id number]  DOA: 05/15/2019 Admitted From: Home  LOS: 3 days   Chief Complaint  Patient presents with  . Foot Pain    rt   Brief narrative: 54 y.o.femalewith history h/oHTN, pre diabetes, asthma, GERD, depression, sleep apnea on CPAP, chronic neuropathic foot ulcers followed by Podiatry Dr [name] [name]. 1/26, patient presented to podiatry clinic with worsening foot wounds/foul smelling drainage and surrounding redness. Patient was followed by Dr [name] [name] as outpatient andwound cultures from 1/12 grew MSSA. Several antibiotics were tried based on sensitivityand then switched either due tointolerance or due to inadequate response. She failed outpatient management with local dressings/oral antibiotics. Given worsening symptoms and concern for osteomyelitis, patientwas referred to ED for admission/MRI/IV antibiotics and possible operative intervention/amputation. Patient was admitted under hospitalist medicine service. Podiatry consulted. Tentative plan for operative intervention on 1/29.  Subjective: Patient was seen and examined this morning. Pleasant middle-aged Caucasian female.  Underwent transmetatarsal amputation on the right today.  Assessment/Plan: Right non healing infected foot wounds with osteomyelitis and surrounding cellulitis -Failed outpatient treatment with oral antibiotics. -No WBC count.  No fever. -MRI showed osteomyelitis involving the distal shaft and head of the fifth metatarsal as well as osteomyelitis involving proximal and distal phalanges of the great toe.  -podiatry consultation obtained.   -Patient underwent transmetatarsal amputation on the right today. Per podiatry note, no abscess was noted.  Remaining bone appeared viable.  Nonweightbearing recommended.  Home health for drain care.  -Await blood culture and wound culture report. -Continue as needed MS Contin for pain.   Resume Neurontin.  Coagulase-negative staph aureus in blood -Blood cultures 1 out of 4 bottles reporting coagulase-negative staph aureus.  -Suspect contamination.  Repeat blood culture sent on 1/28  Essential hypertension -Continue metoprolol  Prediabetes -A1c 5.2  GERD -Protonix.  Depression/bipolar disorder:  -Patient on multiple psych medications including Cymbalta, buspirone, Lexapro and Haldol. Continue all.  History of migraines:Continue Topamax.  Also takes monthly injection of Aimovig.  Spinal stenosis/chronic back pain: -Patient underwent back surgeries and has implants. Follows up at pain clinic. -Chronically on MS Contin, Neurontin, Flexeril.  -All resumed.  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  Morbid obesity - Body mass index is 40.17 kg/m. Patient has been advised to make an attempt to improve diet and exercise patterns to aid in weight loss.  Mobility: Encourage ambulation Diet: Cardiac diet Fluid: Continue IV fluid. DVT prophylaxis:  Lovenox subcu Code Status:  Full code Family Communication:  None at bedside Expected Discharge:  Pending blood culture and biopsy culture.  Consultants:  Podiatry  Procedures:    Antimicrobials: Anti-infectives (From admission, onward)   Start     Dose/Rate Route Frequency Ordered Stop   05/16/19 0500  vancomycin (VANCOREADY) IVPB 750 mg/150 mL     750 mg 150 mL/hr over 60 Minutes Intravenous Every 12 hours 05/15/19 1640     05/15/19 1645  vancomycin (VANCOCIN) IVPB 1000 mg/200 mL premix     1,000 mg 200 mL/hr over 60 Minutes Intravenous  Once 05/15/19 1640 05/15/19 1857   05/15/19 1445  vancomycin (VANCOCIN) IVPB 1000 mg/200 mL premix     1,000 mg 200 mL/hr over 60 Minutes Intravenous  Once 05/15/19 1435 05/15/19 1617        Code Status: Full Code   Diet Order  Diet Carb Modified Fluid  consistency: Thin; Room service appropriate? Yes  Diet effective now              Infusions:  . lactated ringers 100 mL/hr at 05/18/19 0831  . vancomycin 750 mg (05/18/19 0430)    Scheduled Meds: . busPIRone  15 mg Oral TID  . DULoxetine  60 mg Oral QHS  . enoxaparin (LOVENOX) injection  40 mg Subcutaneous Q24H  . escitalopram  5 mg Oral Daily  . fentaNYL (SUBLIMAZE) injection  50-100 mcg Intravenous UD  . gabapentin  600 mg Oral TID  . haloperidol  5 mg Oral QHS  . insulin aspart  0-15 Units Subcutaneous TID WC  . metoprolol succinate  25 mg Oral Daily  . midazolam  1-2 mg Intravenous UD  . morphine  30 mg Oral Q12H  . ondansetron      . traZODone  100 mg Oral QHS    PRN meds: acetaminophen **OR** acetaminophen   Objective: Vitals:   05/18/19 1200 05/18/19 1320  BP:  136/83  Pulse:  75  Resp: 10 12  Temp:  98.3 F (36.8 C)  SpO2:  97%    Intake/Output Summary (Last 24 hours) at 05/18/2019 1448 Last data filed at 05/18/2019 1342 Gross per 24 hour  Intake 3519.3 ml  Output 2200 ml  Net 1319.3 ml   Filed Weights   05/15/19 1330  Weight: 106.1 kg   Weight change:  Body mass index is 40.17 kg/m.   Physical Exam: General exam: Appears calm and comfortable.  Skin: No rashes, lesions or ulcers. HEENT: Atraumatic, normocephalic, supple neck, no obvious bleeding Lungs: Clear to auscultation bilaterally CVS: Regular rate and rhythm, no murmur GI/Abd soft, nontender, nondistended, bowel sound present CNS: Alert, awake, oriented x3 Psychiatry: Mood appropriate Extremities: No edema, no calf tenderness, right foot has bandage on.   Data Review: I have personally reviewed the laboratory data and studies available.  Recent Labs  Lab 05/15/19 1401 05/15/19 1627 05/16/19 0342  WBC 7.2 6.7 5.8  HGB 11.4* 10.4* 10.6*  HCT 35.2* 32.9* 32.3*  MCV 98.9 99.4 97.6  PLT 310 327 280   Recent Labs  Lab 05/15/19 1401 05/16/19 0342  NA 137 138  K 3.7 3.8  CL  104 105  CO2 25 25  GLUCOSE 90 99  BUN 9 9  CREATININE 0.71 0.71  CALCIUM 9.4 8.8*    [name] Croak, MD  [facility] 05/18/2019

## 2019-05-18 NOTE — Anesthesia Preprocedure Evaluation (Addendum)
Anesthesia Evaluation  Patient identified by MRN, date of birth, ID band Patient awake    Reviewed: Allergy & Precautions, H&P , NPO status , Patient's Chart, lab work & pertinent test results  History of Anesthesia Complications Negative for: history of anesthetic complications  Airway Mallampati: II   Neck ROM: full    Dental   Pulmonary asthma , sleep apnea and Continuous Positive Airway Pressure Ventilation ,    breath sounds clear to auscultation       Cardiovascular hypertension, Pt. on home beta blockers and Pt. on medications  Rhythm:regular Rate:Normal     Neuro/Psych  Headaches, Seizures -, Well Controlled,  PSYCHIATRIC DISORDERS Anxiety Depression Bipolar Disorder  Tardive dyskinesia Vertigo     GI/Hepatic Neg liver ROS, GERD  Medicated,  Endo/Other  Morbid obesity Pre-DM   Renal/GU negative Renal ROS     Musculoskeletal  (+) Arthritis , narcotic dependent  Abdominal   Peds  Hematology  (+) anemia ,   Anesthesia Other Findings Covid neg 1/26   Reproductive/Obstetrics                            Anesthesia Physical Anesthesia Plan  ASA: III  Anesthesia Plan: General   Post-op Pain Management:  Regional for Post-op pain   Induction: Intravenous  PONV Risk Score and Plan: 3 and Treatment may vary due to age or medical condition, Ondansetron and Midazolam  Airway Management Planned: LMA  Additional Equipment: None  Intra-op Plan:   Post-operative Plan: Extubation in OR  Informed Consent:   Plan Discussed with: CRNA and Anesthesiologist  Anesthesia Plan Comments:         Anesthesia Quick Evaluation

## 2019-05-18 NOTE — Anesthesia Postprocedure Evaluation (Signed)
Anesthesia Post Note  Patient: [name] [name]  Procedure(s) Performed: TRANSMETATARSAL AMPUTATION (Right Toe)     Patient location during evaluation: PACU Anesthesia Type: General Level of consciousness: awake and alert Pain management: pain level controlled Vital Signs Assessment: post-procedure vital signs reviewed and stable Respiratory status: spontaneous breathing, nonlabored ventilation, respiratory function stable and patient connected to nasal cannula oxygen Cardiovascular status: blood pressure returned to baseline and stable Postop Assessment: no apparent nausea or vomiting Anesthetic complications: no    Last Vitals:  Vitals:   05/18/19 1132 05/18/19 1145  BP: 126/80 127/72  Pulse: 76 75  Resp: 12 (!) 9  Temp: 36.4 C   SpO2: 100% 100%    Last Pain:  Vitals:   05/18/19 1132  TempSrc:   PainSc: 0-No pain                 Catalina Gravel

## 2019-05-19 LAB — BASIC METABOLIC PANEL
Anion gap: 8 (ref 5–15)
BUN: 8 mg/dL (ref 6–20)
CO2: 25 mmol/L (ref 22–32)
Calcium: 8.6 mg/dL — ABNORMAL LOW (ref 8.9–10.3)
Chloride: 107 mmol/L (ref 98–111)
Creatinine, Ser: 0.53 mg/dL (ref 0.44–1.00)
GFR calc Af Amer: >60 mL/min (ref >60)
GFR calc non Af Amer: >60 mL/min (ref >60)
Glucose, Bld: 107 mg/dL — ABNORMAL HIGH (ref 70–99)
Potassium: 3.4 mmol/L — ABNORMAL LOW (ref 3.5–5.1)
Sodium: 140 mmol/L (ref 135–145)

## 2019-05-19 LAB — CBC WITH DIFFERENTIAL/PLATELET
Abs Immature Granulocytes: 0.03 10*3/uL (ref 0.00–0.07)
Basophils Absolute: 0 10*3/uL (ref 0.0–0.1)
Basophils Relative: 0 %
Eosinophils Absolute: 0 10*3/uL (ref 0.0–0.5)
Eosinophils Relative: 0 %
HCT: 28.9 % — ABNORMAL LOW (ref 36.0–46.0)
Hemoglobin: 9.4 g/dL — ABNORMAL LOW (ref 12.0–15.0)
Immature Granulocytes: 0 %
Lymphocytes Relative: 31 %
Lymphs Abs: 2.2 10*3/uL (ref 0.7–4.0)
MCH: 32.2 pg (ref 26.0–34.0)
MCHC: 32.5 g/dL (ref 30.0–36.0)
MCV: 99 fL (ref 80.0–100.0)
Monocytes Absolute: 0.5 10*3/uL (ref 0.1–1.0)
Monocytes Relative: 6 %
Neutro Abs: 4.4 10*3/uL (ref 1.7–7.7)
Neutrophils Relative %: 63 %
Platelets: 255 10*3/uL (ref 150–400)
RBC: 2.92 MIL/uL — ABNORMAL LOW (ref 3.87–5.11)
RDW: 12.8 % (ref 11.5–15.5)
WBC: 7.2 10*3/uL (ref 4.0–10.5)
nRBC: 0 % (ref 0.0–0.2)

## 2019-05-19 LAB — GLUCOSE, CAPILLARY
Glucose-Capillary: 109 mg/dL — ABNORMAL HIGH (ref 70–99)
Glucose-Capillary: 94 mg/dL (ref 70–99)
Glucose-Capillary: 95 mg/dL (ref 70–99)
Glucose-Capillary: 90 mg/dL (ref 70–99)

## 2019-05-19 LAB — MAGNESIUM: Magnesium: 2.1 mg/dL (ref 1.7–2.4)

## 2019-05-19 LAB — PHOSPHORUS: Phosphorus: 4.5 mg/dL (ref 2.5–4.6)

## 2019-05-19 MED ORDER — POTASSIUM CHLORIDE CRYS ER 20 MEQ PO TBCR
40.0000 meq | EXTENDED_RELEASE_TABLET | Freq: Once | ORAL | Status: AC
  Administered 2019-05-19: 40 meq via ORAL
  Filled 2019-05-19: qty 2

## 2019-05-19 MED ORDER — CEFAZOLIN SODIUM-DEXTROSE 2-4 GM/100ML-% IV SOLN
2.0000 g | Freq: Three times a day (TID) | INTRAVENOUS | Status: DC
  Administered 2019-05-19 – 2019-05-21 (×5): 2 g via INTRAVENOUS
  Filled 2019-05-19 (×6): qty 100

## 2019-05-19 MED ORDER — CEFAZOLIN SODIUM-DEXTROSE 2-4 GM/100ML-% IV SOLN
2.0000 g | Freq: Three times a day (TID) | INTRAVENOUS | Status: DC
  Filled 2019-05-19 (×2): qty 100

## 2019-05-19 NOTE — Progress Notes (Addendum)
PROGRESS NOTE  [name] [name]  DOB: April 15, 1966  PCP: [name], MD TQ:[id number]  DOA: 05/15/2019 Admitted From: Home  LOS: 4 days   Chief Complaint  Patient presents with  . Foot Pain    rt   Brief narrative: 54 y.o.femalewith history h/oHTN, pre diabetes, asthma, GERD, depression, sleep apnea on CPAP, chronic neuropathic foot ulcers followed by Podiatry Dr Jacqualyn Posey. 1/26, patient presented to podiatry clinic with worsening foot wounds/foul smelling drainage and surrounding redness. Patient was followed by Dr Jacqualyn Posey as outpatient andwound cultures from 1/12 grew MSSA. Several antibiotics were tried based on sensitivityand then switched either due tointolerance or due to inadequate response. She failed outpatient management with local dressings/oral antibiotics. Given worsening symptoms and concern for osteomyelitis, patientwas referred to ED for admission/MRI/IV antibiotics and possible operative intervention/amputation. Patient was admitted under hospitalist medicine service. Podiatry consulted. Tentative plan for operative intervention on 1/29.  Subjective: Patient was seen and examined this morning. Pleasant middle-aged Caucasian female.  Underwent transmetatarsal amputation on the right yesterday. Pain adequately controlled. Noted to follow-up by podiatry this morning.  Assessment/Plan: Right non healing infected foot wounds with osteomyelitis and surrounding cellulitis -Failed outpatient treatment with oral antibiotics. -Not septic at presentation -MRI showed osteomyelitis involving the distal shaft and head of the fifth metatarsal as well as osteomyelitis involving proximal and distal phalanges of the great toe.  -podiatry consultation obtained.   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 Per podiatry note, no abscess was noted.  Remaining bone appeared viable.  Nonweightbearing recommended.  Home health for drain care.  -Await blood  culture and wound culture report. -Continue as needed MS Contin for pain.  Resumed Neurontin.  Coagulase-negative staph aureus in blood -Blood cultures 1 out of 4 bottles reporting coagulase-negative staph aureus.  -Suspect contamination.  Repeat blood culture sent on 1/28 has not shown any growth so far.  Essential hypertension -Continue metoprolol  Prediabetes -A1c 5.2  GERD -Protonix.  Depression/bipolar disorder:  -Patient on multiple psych medications including Cymbalta, buspirone, Lexapro and Haldol. Continue all.  History of migraines:Continue Topamax, Imitrex.  Also takes monthly injection of Aimovig.  Spinal stenosis/chronic back pain: -Patient underwent back surgeries and has implants. Follows up at pain clinic. -Chronically on MS Contin, Neurontin, Flexeril.  -All resumed.  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  Morbid obesity - Body mass index is 40.17 kg/m. Patient has been advised to make an attempt to improve diet and exercise patterns to aid in weight loss.  Mobility: Encourage ambulation Diet: Cardiac diet Fluid: Stop IV fluid DVT prophylaxis:  Lovenox subcu Code Status:  Full code Family Communication:  None at bedside Expected Discharge:  Pending blood culture and biopsy culture.  Consultants:  Podiatry  Procedures:    Antimicrobials: Anti-infectives (From admission, onward)   Start     Dose/Rate Route Frequency Ordered Stop   05/19/19 1300  ceFAZolin (ANCEF) IVPB 2g/100 mL premix     2 g 200 mL/hr over 30 Minutes Intravenous Every 8 hours 05/19/19 1220     05/16/19 0500  vancomycin (VANCOREADY) IVPB 750 mg/150 mL  Status:  Discontinued     750 mg 150 mL/hr over 60 Minutes Intravenous Every 12 hours 05/15/19 1640 05/19/19 1220   05/15/19 1645  vancomycin (VANCOCIN) IVPB 1000 mg/200 mL premix     1,000 mg 200 mL/hr over 60 Minutes  Intravenous  Once 05/15/19 1640 05/15/19 1857  05/15/19 1445  vancomycin (VANCOCIN) IVPB 1000 mg/200 mL premix     1,000 mg 200 mL/hr over 60 Minutes Intravenous  Once 05/15/19 1435 05/15/19 1617        Code Status: Full Code   Diet Order            Diet Carb Modified Fluid consistency: Thin; Room service appropriate? Yes  Diet effective now              Infusions:  .  ceFAZolin (ANCEF) IV    . lactated ringers 100 mL/hr at 05/18/19 0831    Scheduled Meds: . busPIRone  15 mg Oral TID  . DULoxetine  60 mg Oral QHS  . enoxaparin (LOVENOX) injection  40 mg Subcutaneous Q24H  . escitalopram  5 mg Oral Daily  . fentaNYL (SUBLIMAZE) injection  50-100 mcg Intravenous UD  . gabapentin  600 mg Oral TID  . haloperidol  5 mg Oral QHS  . insulin aspart  0-15 Units Subcutaneous TID WC  . metoprolol succinate  25 mg Oral Daily  . midazolam  1-2 mg Intravenous UD  . morphine  30 mg Oral Q12H  . traZODone  100 mg Oral QHS    PRN meds: acetaminophen **OR** acetaminophen, SUMAtriptan   Objective: Vitals:   05/19/19 0548 05/19/19 1256  BP: 116/72 109/72  Pulse: 64 78  Resp: 16 17  Temp: (!) 97.5 F (36.4 C) 97.8 F (36.6 C)  SpO2: 94% 98%    Intake/Output Summary (Last 24 hours) at 05/19/2019 1405 Last data filed at 05/19/2019 1322 Gross per 24 hour  Intake 2340 ml  Output 200 ml  Net 2140 ml   Filed Weights   05/15/19 1330  Weight: 106.1 kg   Weight change:  Body mass index is 40.17 kg/m.   Physical Exam: General exam: Appears calm and comfortable.  Skin: No rashes, lesions or ulcers. HEENT: Atraumatic, normocephalic, supple neck, no obvious bleeding Lungs: Clear to auscultation bilaterally CVS: Regular rate and rhythm, no murmur GI/Abd soft, nontender, nondistended, bowel sound present CNS: Alert, awake, oriented x3 Psychiatry: Mood appropriate Extremities: No edema, no calf tenderness, right foot transmetatarsal amputation status with the bandage  on.  Data Review: I have personally reviewed the laboratory data and studies available.  Recent Labs  Lab 05/15/19 1401 05/15/19 1627 05/16/19 0342 05/19/19 0826  WBC 7.2 6.7 5.8 7.2  NEUTROABS  --   --   --  4.4  HGB 11.4* 10.4* 10.6* 9.4*  HCT 35.2* 32.9* 32.3* 28.9*  MCV 98.9 99.4 97.6 99.0  PLT 310 327 280 255   Recent Labs  Lab 05/15/19 1401 05/16/19 0342 05/19/19 0826  NA 137 138 140  K 3.7 3.8 3.4*  CL 104 105 107  CO2 25 25 25   GLUCOSE 90 99 107*  BUN 9 9 8   CREATININE 0.71 0.71 0.53  CALCIUM 9.4 8.8* 8.6*  MG  --   --  2.1  PHOS  --   --  4.5    Terrilee Croak, MD  [facility] 05/19/2019

## 2019-05-19 NOTE — Progress Notes (Signed)
Subjective: POD #1 s/p right foot TMA.  She states that she is feeling well and her pain is controlled.  She feels a nerve block is starting to wear off.  She has no new concerns this morning.  She denies any fevers, chills, nausea, vomiting.  No calf pain, chest pain, shortness of breath.   Objective: AAO x3, NAD Bandages clean, dry, intact.  Drain still intact with only bloody drainage.  There is no purulence.  Not complaining of any pain at the surgical site. No pain with calf compression, swelling, warmth, erythema  Assessment: Postop day #1 status post right transmetatarsal amputation  Plan: We will plan to remove the bandage tomorrow and change the dressing.  Awaiting culture results from surgery yesterday. For now continue current antibiotics and await the culture.  Based on that culture will determine further antibiotic needs.  Once culture comes back would recommend at least touching base with infectious disease given multiple intolerances to several antibiotics.  Nonweightbearing in cam boot.  Celesta Gentile, DPM O: [phone] C: [phone]

## 2019-05-19 NOTE — Evaluation (Signed)
Physical Therapy Evaluation Patient Details Name: [name] [name] MRN: [id number] DOB: 08/17/1965 Today's Date: 05/19/2019   History of Present Illness  Pt s/p R transmetatarsal amputation 2* osteomyelitis.  Pt with hx of back surgery, depression, bipolar and seizures  Clinical Impression  Pt admitted as above and presenting with functional mobility limitations 2* balance and endurance deficits related to maintaining NWB on R LE.  Pt plans to dc to son's home with 3 stairs to enter and no hand rails making transition to home difficult.  Pt also resistant to use of wc at home.  Will revisit these issues in am.    Follow Up Recommendations Home health PT    Equipment Recommendations  3in1 (PT)(pt would also benefit from wc use but resistant)    Recommendations for Other Services       Precautions / Restrictions Precautions Precautions: Fall Restrictions Weight Bearing Restrictions: Yes RLE Weight Bearing: Non weight bearing Other Position/Activity Restrictions: with cam boot in place      Mobility  Bed Mobility Overal bed mobility: Modified Independent             General bed mobility comments: supine<>sit unassisted  Transfers Overall transfer level: Needs assistance Equipment used: Rolling walker (2 wheeled) Transfers: Sit to/from [facility] Sit to Stand: Min assist Stand pivot transfers: Min guard       General transfer comment: steady assist; cues for use of UEs and for R LE management to limit WB:  Pt performed stand/pvt Wc to bed utilizing bedrail for balance/support  Ambulation/Gait Ambulation/Gait assistance: Min assist Gait Distance (Feet): 20 Feet Assistive device: Rolling walker (2 wheeled) Gait Pattern/deviations: Step-to pattern;Decreased step length - right;Decreased step length - left;Shuffle;Trunk flexed Gait velocity: decr   General Gait Details: cues for posture, position from RW, sequence.  Pt struggling with NWB  Stairs             Wheelchair Mobility    Modified Rankin (Stroke Patients Only)       Balance Overall balance assessment: Needs assistance Sitting-balance support: No upper extremity supported;Feet supported Sitting balance-Leahy Scale: Normal     Standing balance support: Bilateral upper extremity supported Standing balance-Leahy Scale: Poor Standing balance comment: NWB on R LE - pt reliant on UEs on RW to maintain balance                             Pertinent Vitals/Pain Pain Assessment: Faces Faces Pain Scale: Hurts little more Pain Location: R foot Pain Descriptors / Indicators: Sore Pain Intervention(s): Limited activity within patient's tolerance;Monitored during session    Home Living Family/patient expects to be discharged to:: Private residence Living Arrangements: Children Available Help at Discharge: Available 24 hours/day Type of Home: Mobile home Home Access: Stairs to enter Entrance Stairs-Rails: None Entrance Stairs-Number of Steps: 3 Home Layout: One level Home Equipment: Environmental consultant - 2 wheels;Tub bench      Prior Function Level of Independence: Independent               Hand Dominance        Extremity/Trunk Assessment   Upper Extremity Assessment Upper Extremity Assessment: Overall WFL for tasks assessed    Lower Extremity Assessment Lower Extremity Assessment: RLE deficits/detail RLE Deficits / Details: dressings in place at foot       Communication   Communication: No difficulties  Cognition Arousal/Alertness: Awake/alert Behavior During Therapy: WFL for tasks assessed/performed Overall Cognitive Status: Within Functional  Limits for tasks assessed                                        General Comments      Exercises     Assessment/Plan    PT Assessment Patient needs continued PT services  PT Problem List Decreased strength;Decreased activity tolerance;Decreased balance;Decreased  mobility;Decreased knowledge of use of DME;Obesity;Pain       PT Treatment Interventions DME instruction;Gait training;Stair training;Functional mobility training;Therapeutic activities;Therapeutic exercise;Patient/family education    PT Goals (Current goals can be found in the Care Plan section)  Acute Rehab PT Goals Patient Stated Goal: Regain IND PT Goal Formulation: With patient Time For Goal Achievement: 06/02/19 Potential to Achieve Goals: Fair    Frequency Min 6X/week   Barriers to discharge        Co-evaluation               AM-PAC PT "6 Clicks" Mobility  Outcome Measure Help needed turning from your back to your side while in a flat bed without using bedrails?: None Help needed moving from lying on your back to sitting on the side of a flat bed without using bedrails?: None Help needed moving to and from a bed to a chair (including a wheelchair)?: A Little Help needed standing up from a chair using your arms (e.g., wheelchair or bedside chair)?: A Little Help needed to walk in hospital room?: A Little Help needed climbing 3-5 steps with a railing? : A Lot 6 Click Score: 19    End of Session Equipment Utilized During Treatment: Gait belt Activity Tolerance: Patient limited by fatigue Patient left: in bed;with call bell/phone within reach;with bed alarm set Nurse Communication: Mobility status PT Visit Diagnosis: Difficulty in walking, not elsewhere classified (R26.2)    Time: [id number] PT Time Calculation (min) (ACUTE ONLY): 22 min   Charges:   PT Evaluation $PT Eval Low Complexity: 1 Low          [place] Pager [phone] Office [phone]   Kiri Hinderliter 05/19/2019, 5:04 PM

## 2019-05-20 LAB — GLUCOSE, CAPILLARY
Glucose-Capillary: 81 mg/dL (ref 70–99)
Glucose-Capillary: 97 mg/dL (ref 70–99)
Glucose-Capillary: 100 mg/dL — ABNORMAL HIGH (ref 70–99)
Glucose-Capillary: 95 mg/dL (ref 70–99)

## 2019-05-20 LAB — CULTURE, BLOOD (ROUTINE X 2)
Culture: NO GROWTH
Special Requests: ADEQUATE

## 2019-05-20 MED ORDER — POTASSIUM CHLORIDE CRYS ER 20 MEQ PO TBCR
40.0000 meq | EXTENDED_RELEASE_TABLET | Freq: Once | ORAL | Status: AC
  Administered 2019-05-20: 40 meq via ORAL
  Filled 2019-05-20: qty 2

## 2019-05-20 MED ORDER — SALINE SPRAY 0.65 % NA SOLN
1.0000 | NASAL | Status: DC | PRN
  Administered 2019-05-20: 1 via NASAL
  Filled 2019-05-20: qty 44

## 2019-05-20 NOTE — TOC Progression Note (Addendum)
Transition of Care [facility]) - Progression Note    Patient Details  Name: [name] [name] MRN: [id number] Date of Birth: February 17, 1966  [facility]) CM/SW Contact  Norina Buzzard, RN Phone Number: 05/20/2019, 4:57 PM  Clinical Narrative: 54 yo with osteomyelitis, s/p R foot TMA. Referral received to assist pt with HHPT and DME (3-in-1 BSC). Met with pt to discuss the D/C plan. She plans to return home with the support of her son. She has a walker and a cane. She has a PCP. She denies any issues filling her prescriptions. She agrees with HHPT and 3-in-1 BSC. Asked pt if she has a preference for a [place] agency. She reports that she doesn't have a preference and provided pt with a CMS Medicare.gov Compare Post Acute Care list of [facility] agencies. Pt doesn't have a preference. Will contact [place] agencies and will f/u with pt.     Expected Discharge Plan: [place] Barriers to Discharge: No Barriers Identified  Expected Discharge Plan and Services Expected Discharge Plan: [place]   Discharge Planning Services: CM Consult Post Acute Care Choice: [place] arrangements for the past 2 months: Single Family Home                 DME Arranged: 3-N-1 DME Agency: [facility] Date DME Agency Contacted: 05/20/19 Time DME Agency Contacted: [id number] Representative spoke with at DME Agency: [place]: PT [place]: [name] ([place]), [place], [facility] [place]), [facility], [place] Date [place]: 05/20/19 Time [place]: 1030 Representative spoke with at [place]: Lanny Hurst, Lillia Pauls, [place]   Social Determinants of Health (SDOH) Interventions    Readmission Risk Interventions No flowsheet data found.

## 2019-05-20 NOTE — Progress Notes (Signed)
Received a call from [place] with [facility] and she accepted the referral. Met with pt and RN made them aware that [facility] will provide [facility] PT. Pt is agreeable with agency.

## 2019-05-20 NOTE — Progress Notes (Signed)
PROGRESS NOTE  [name] [name]  DOB: Oct 06, 1965  PCP: [name], MD TQ:[id number]  DOA: 05/15/2019 Admitted From: Home  LOS: 5 days   Chief Complaint  Patient presents with  . Foot Pain    rt   Brief narrative: 54 y.o.femalewith history h/oHTN, pre diabetes, asthma, GERD, depression, sleep apnea on CPAP, chronic neuropathic foot ulcers followed by Podiatry Dr Jacqualyn Posey. 1/26, patient presented to podiatry clinic with worsening foot wounds/foul smelling drainage and surrounding redness. Patient was followed by Dr Jacqualyn Posey as outpatient andwound cultures from 1/12 grew MSSA. Several antibiotics were tried based on sensitivityand then switched either due tointolerance or due to inadequate response. She failed outpatient management with local dressings/oral antibiotics. Given worsening symptoms and concern for osteo  myelitis, patientwas referred to ED for admission/MRI/IV antibiotics and possible operative intervention/amputation. Patient was admitted under hospitalist medicine service. Podiatry consulted. Tentative plan for operative intervention on 1/29.  Subjective: Patient was seen and examined this morning. Pleasant middle-aged Caucasian female.  Underwent transmetatarsal amputation on the right yesterday. Pain adequately controlled. Noted to follow-up by podiatry this morning.  Assessment/Plan: Right non healing infected foot wounds with osteomyelitis and surrounding cellulitis -Failed outpatient treatment with oral antibiotics. -Not septic at presentation -MRI showed osteomyelitis involving the distal shaft and head of the fifth metatarsal as well as osteomyelitis involving proximal and distal phalanges of the great toe.  -podiatry consultation obtained.   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 Per podiatry note, no abscess was noted.  Remaining bone appeared viable.  Nonweightbearing recommended.  Home health for drain care.  -Await  wound culture report. -Continue as needed MS Contin for pain.  Resumed Neurontin.  Coagulase-negative staph aureus in blood -Blood cultures 1 out of 4 bottles reporting coagulase-negative staph aureus.  -Suspect contamination.  Repeat blood culture sent on 1/28 has not shown any growth so far.  Essential hypertension -Continue metoprolol  Prediabetes -A1c 5.2  GERD -Protonix.  Depression/bipolar disorder:  -Patient on multiple psych medications including Cymbalta, buspirone, Lexapro and Haldol. Continue all.  History of migraines:Continue Topamax, Imitrex.  Also takes monthly injection of Aimovig.  Spinal stenosis/chronic back pain: -Patient underwent back surgeries and has implants. Follows up at pain clinic. -Chronically on MS Contin, Neurontin, Flexeril.  -All resumed.  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  Morbid obesity - Body mass index is 40.17 kg/m. Patient has been advised to make an attempt to improve diet and exercise patterns to aid in weight loss.  Mobility: Encourage ambulation. PT eval appreciated. Diet: Cardiac diet Fluid: Stop IV fluid DVT prophylaxis:  Lovenox subcu Code Status:  Full code Family Communication:  None at bedside Expected Discharge:  Pending wound culture report and clearance from podiatry. Consultants:  Podiatry  Procedures:    Antimicrobials: Anti-infectives (From admission, onward)   Start     Dose/Rate Route Frequency Ordered Stop   05/19/19 1800  ceFAZolin (ANCEF) IVPB 2g/100 mL premix     2 g 200 mL/hr over 30 Minutes Intravenous Every 8 hours 05/19/19 1719     05/19/19 1300  ceFAZolin (ANCEF) IVPB 2g/100 mL premix  Status:  Discontinued     2 g 200 mL/hr over 30 Minutes Intravenous Every 8 hours 05/19/19 1220 05/19/19 1719   05/16/19 0500  vancomycin (VANCOREADY) IVPB 750 mg/150 mL  Status:  Discontinued     750  mg 150 mL/hr over 60 Minutes Intravenous  Every 12 hours 05/15/19 1640 05/19/19 1220   05/15/19 1645  vancomycin (VANCOCIN) IVPB 1000 mg/200 mL premix     1,000 mg 200 mL/hr over 60 Minutes Intravenous  Once 05/15/19 1640 05/15/19 1857   05/15/19 1445  vancomycin (VANCOCIN) IVPB 1000 mg/200 mL premix     1,000 mg 200 mL/hr over 60 Minutes Intravenous  Once 05/15/19 1435 05/15/19 1617        Code Status: Full Code   Diet Order            Diet Carb Modified Fluid consistency: Thin; Room service appropriate? Yes  Diet effective now              Infusions:  .  ceFAZolin (ANCEF) IV 2 g (05/20/19 0915)  . lactated ringers 20 mL/hr at 05/20/19 0600    Scheduled Meds: . busPIRone  15 mg Oral TID  . DULoxetine  60 mg Oral QHS  . enoxaparin (LOVENOX) injection  40 mg Subcutaneous Q24H  . escitalopram  5 mg Oral Daily  . fentaNYL (SUBLIMAZE) injection  50-100 mcg Intravenous UD  . gabapentin  600 mg Oral TID  . haloperidol  5 mg Oral QHS  . insulin aspart  0-15 Units Subcutaneous TID WC  . metoprolol succinate  25 mg Oral Daily  . midazolam  1-2 mg Intravenous UD  . morphine  30 mg Oral Q12H  . traZODone  100 mg Oral QHS    PRN meds: acetaminophen **OR** acetaminophen, sodium chloride, SUMAtriptan   Objective: Vitals:   05/20/19 0408 05/20/19 0914  BP: 135/82 120/75  Pulse: 69 69  Resp: 18 18  Temp: 97.9 F (36.6 C) 98 F (36.7 C)  SpO2: 98% 95%    Intake/Output Summary (Last 24 hours) at 05/20/2019 1324 Last data filed at 05/20/2019 1000 Gross per 24 hour  Intake 1511.85 ml  Output 206 ml  Net 1305.85 ml   Filed Weights   05/15/19 1330  Weight: 106.1 kg   Weight change:  Body mass index is 40.17 kg/m.   Physical Exam: General exam: Appears calm and comfortable.  Skin: No rashes, lesions or ulcers. HEENT: Atraumatic, normocephalic, supple neck, no obvious bleeding Lungs: Clear to auscultation bilaterally CVS: Regular rate and rhythm, no murmur GI/Abd  soft, nontender, nondistended, bowel sound present CNS: Alert, awake, oriented x3 Psychiatry: Mood appropriate Extremities: No edema, no calf tenderness, right foot transmetatarsal amputation status with the bandage on.  Data Review: I have personally reviewed the laboratory data and studies available.  Recent Labs  Lab 05/15/19 1401 05/15/19 1627 05/16/19 0342 05/19/19 0826  WBC 7.2 6.7 5.8 7.2  NEUTROABS  --   --   --  4.4  HGB 11.4* 10.4* 10.6* 9.4*  HCT 35.2* 32.9* 32.3* 28.9*  MCV 98.9 99.4 97.6 99.0  PLT 310 327 280 255   Recent Labs  Lab 05/15/19 1401 05/16/19 0342 05/19/19 0826  NA 137 138 140  K 3.7 3.8 3.4*  CL 104 105 107  CO2 25 25 25   GLUCOSE 90 99 107*  BUN 9 9 8   CREATININE 0.71 0.71 0.53  CALCIUM 9.4 8.8* 8.6*  MG  --   --  2.1  PHOS  --   --  4.5    Terrilee Croak, MD  [facility] 05/20/2019

## 2019-05-20 NOTE — Plan of Care (Signed)

## 2019-05-20 NOTE — Progress Notes (Signed)
Physical Therapy Treatment Patient Details Name: [name] [name] MRN: [id number] DOB: [birth year] Today's Date: 05/20/2019    History of Present Illness Pt s/p R transmetatarsal amputation 2* osteomyelitis.  Pt with hx of back surgery, depression, bipolar and seizures    PT Comments    Pt progressing with mobility but continues ltd by NWB status on R LE.  Pt able to negotiate stairs bkwd with RW but TDWB on R for stability.   Follow Up Recommendations  Home health PT     Equipment Recommendations  3in1 (PT)    Recommendations for Other Services       Precautions / Restrictions Precautions Precautions: Fall Restrictions Weight Bearing Restrictions: Yes RLE Weight Bearing: Non weight bearing Other Position/Activity Restrictions: with cam boot in place    Mobility  Bed Mobility Overal bed mobility: Modified Independent             General bed mobility comments: supine<>sit unassisted  Transfers Overall transfer level: Needs assistance Equipment used: Rolling walker (2 wheeled) Transfers: Sit to/from Stand Sit to Stand: Min guard         General transfer comment: steady assist; cues for use of UEs and for R LE management to limit WB:   Ambulation/Gait Ambulation/Gait assistance: Min assist Gait Distance (Feet): 20 Feet Assistive device: Rolling walker (2 wheeled) Gait Pattern/deviations: Step-to pattern;Decreased step length - right;Decreased step length - left;Shuffle;Trunk flexed Gait velocity: decr   General Gait Details: cues for posture, position from RW, sequence.  Pt struggling with NWB   Stairs Stairs: Yes Stairs assistance: Min assist Stair Management: No rails Number of Stairs: 2 General stair comments: 2 stairs x 3 with RW bkwd with cues for sequence, foot placement and TDWB required to safely negotiate stairs   Wheelchair Mobility    Modified Rankin (Stroke Patients Only)       Balance Overall balance assessment: Needs  assistance Sitting-balance support: No upper extremity supported;Feet supported Sitting balance-Leahy Scale: Normal     Standing balance support: Bilateral upper extremity supported Standing balance-Leahy Scale: Poor Standing balance comment: NWB on R LE - pt reliant on UEs on RW to maintain balance                            Cognition Arousal/Alertness: Awake/alert Behavior During Therapy: WFL for tasks assessed/performed Overall Cognitive Status: Within Functional Limits for tasks assessed                                        Exercises      General Comments        Pertinent Vitals/Pain Pain Assessment: 0-10 Pain Score: 4  Pain Location: R foot Pain Descriptors / Indicators: Sore Pain Intervention(s): Limited activity within patient's tolerance;Monitored during session;Premedicated before session    Home Living                      Prior Function            PT Goals (current goals can now be found in the care plan section) Acute Rehab PT Goals Patient Stated Goal: Regain IND PT Goal Formulation: With patient Time For Goal Achievement: 06/02/19 Potential to Achieve Goals: Fair Progress towards PT goals: Progressing toward goals    Frequency    Min 6X/week      PT Plan Current plan  remains appropriate    Co-evaluation              AM-PAC PT "6 Clicks" Mobility   Outcome Measure  Help needed turning from your back to your side while in a flat bed without using bedrails?: None Help needed moving from lying on your back to sitting on the side of a flat bed without using bedrails?: None Help needed moving to and from a bed to a chair (including a wheelchair)?: A Little Help needed standing up from a chair using your arms (e.g., wheelchair or bedside chair)?: A Little Help needed to walk in hospital room?: A Little Help needed climbing 3-5 steps with a railing? : A Lot 6 Click Score: 19    End of Session  Equipment Utilized During Treatment: Gait belt Activity Tolerance: Patient limited by fatigue Patient left: in chair;with call bell/phone within reach;with chair alarm set Nurse Communication: Mobility status PT Visit Diagnosis: Difficulty in walking, not elsewhere classified (R26.2)     Time: 1008-1040 PT Time Calculation (min) (ACUTE ONLY): 32 min  Charges:  $Gait Training: 8-22 mins $Therapeutic Activity: 8-22 mins                     [place] Pager [phone] Office [phone]    Lavonn Maxcy 05/20/2019, 11:58 AM

## 2019-05-20 NOTE — Progress Notes (Signed)
Unable to arrange HHPT due to pt's insurance [facility]). Contacted the following [place] agencies:  1. [facility], spoke with Corene Cornea, he declined the referral.  2. [facility], spoke with Katrina, she declined the referral.  [facility], spoke with Dian Situ, he did not accept the referral.  [facility], spoke with Georgina Snell, he is not able to accept the referral.  [place], left a VM for [place].

## 2019-05-20 NOTE — Progress Notes (Signed)
RN called into pt's room. Pt told RN that pt had stayed with her nephew before admission, and "he had bed bugs".  Pt stated she saw them in her CAM boot.  This was confirmed by the aid/RN and specimen was obtained.  RN made MD aware.  RN also spoke with Infection prevention and was told to bathe pt, move to another room, obtain specimen, contact EVS, and bag all belongings, standard precautions.  Pt bathed, moved to new room, and belongings placed in double plastic bags.  RN spoke with house keeper on unit, and let her know about bed bugs being found in room.  Ortho tech paged to obtained new CAM boot.  Awaiting response.

## 2019-05-20 NOTE — Progress Notes (Signed)
Subjective: POD #2 s/p right foot TMA.  She is doing well and pain is controlled.  She feels that the nerve block is worn off at this time.  No significant pain.  Denies any fevers, chills, nausea, vomiting.  No calf pain, chest pain, shortness of breath.   Objective: AAO x3, NAD Bandages clean, dry, intact.  Drain still intact with only bloody drainage.  There is no purulence.  Upon removal of the bandages staples, sutures intact.  There is mild swelling and faint erythema of the distal aspect along the incision. There is no ascending cellulitis there is no drainage or pus identified from the incision.  No tenderness palpation.  There is no fluctuation or crepitation. No pain with calf compression, swelling, warmth, erythema  Assessment: Postop day #2 status post right transmetatarsal amputation  Plan: Dressing was changed today.  Xeroform was applied followed by dry sterile dressing.  Nonweightbearing in cam boot.  Continue current antibiotics until culture.  Hopefully we can narrow antibiotics tomorrow likely discharge tomorrow pending culture results.  Would likely touch base with infectious disease given multiple intolerances to antibiotics.  Home health care if able for drain care.   [name] [name], DPM O: [phone] C: [phone]

## 2019-05-21 ENCOUNTER — Telehealth: Payer: Self-pay | Admitting: Podiatry

## 2019-05-21 LAB — GLUCOSE, CAPILLARY
Glucose-Capillary: 90 mg/dL (ref 70–99)
Glucose-Capillary: 98 mg/dL (ref 70–99)

## 2019-05-21 LAB — SURGICAL PATHOLOGY

## 2019-05-21 MED ORDER — CEPHALEXIN 500 MG PO CAPS
500.0000 mg | ORAL_CAPSULE | Freq: Three times a day (TID) | ORAL | Status: DC
  Administered 2019-05-21: 500 mg via ORAL
  Filled 2019-05-21: qty 1

## 2019-05-21 MED ORDER — CEPHALEXIN 500 MG PO CAPS: 500.0000 mg | ORAL_CAPSULE | Freq: Three times a day (TID) | ORAL | 0 refills | Status: DC

## 2019-05-21 NOTE — Discharge Summary (Signed)
Physician Discharge Summary  [name] [name] YQM:[id number] DOB: 09-24-1965 DOA: 05/15/2019  PCP: [name], MD  Admit date: 05/15/2019 Discharge date: 05/21/2019  Admitted From: Home Discharge disposition: Home health PT   Code Status: Full Code  Diet Recommendation: Cardiac/diabetic diet   Recommendations for Outpatient Follow-Up:   1. Follow-up with PCP as an outpatient 2. Follow-up with podiatry as an outpatient  Discharge Diagnosis:   Principal Problem:   Osteomyelitis ([place]) Active Problems:   Manic bipolar I disorder ([name])   Toe ulcer, right ([name])   Cellulitis of toe of right foot   Hallux malleus   Essential hypertension   Migraines   Post laminectomy syndrome   Sleep apnea   Spinal stenosis of lumbar region   Foot ulcer ([place])   Non-healing open wound of toe   History of Present Illness / Brief narrative:  54 y.o.femalewith history h/oHTN, pre diabetes, asthma, GERD, depression, sleep apnea on CPAP, chronic neuropathic foot ulcers followed by Podiatry Dr Jacqualyn Posey. 1/26, patient presented to podiatry clinic with worsening foot wounds/foul smelling drainage and surrounding redness. Patient was followed by Dr Jacqualyn Posey as outpatient andwound cultures from 1/12 grew MSSA. Several antibiotics were tried based on sensitivityand then switched either due tointolerance or due to inadequate response.She failed outpatient management with local dressings/oral antibiotics. Given worsening symptoms and concern for osteo  myelitis, patientwas referred to ED for admission/MRI/IV antibiotics and possible operative intervention/amputation. Patient was admitted under hospitalist medicine service. Podiatry consulted. Tentative plan for operative intervention on 1/29.  Hospital Course:  Right non healing infected foot wounds with osteomyelitisand surrounding cellulitis -Failed outpatient treatment with oral antibiotics. -Not septic at presentation -MRI showed  osteomyelitis involving the distal shaft and head of the fifth metatarsalas well as osteomyelitis involving proximal and distal phalanges of the great toe. -podiatry consultation obtained.   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 Per podiatry note, no abscess was noted.  Remaining bone appeared viable.  Nonweightbearing recommended.  Home health for drain care.  -Wound culture did not show any growth.  Patient is currently on IV Ancef. -I discussed the case with podiatry Dr. Jacqualyn Posey as well as infectious disease Dr. Megan Salon.  We will discharge the patient on oral Keflex.  Patient states that she has intolerance to a lot of antibiotics but she has tolerated Keflex in the past.  I wrote her a prescription for Keflex for 7 days.  She has an appointment with podiatry office on Wednesday.  May need longer term antibiotics based on progression. -Patient takes MS Contin and Neurontin at home and follows up with pain management specialist.  Continue same.  Coagulase-negative staph aureus in blood -Blood cultures 1 out of 4 bottles reporting coagulase-negative staph aureus.  -Suspected contamination.  Repeat blood culture sent on 1/28 has not shown any growth so far.  Essential hypertension -Continue metoprolol  Prediabetes -A1c 5.2 -Continue diet control  GERD -Protonix.  Depression/bipolar disorder:  -Patient on multiple psych medications including Cymbalta, buspirone, Lexapro and Haldol. Continue all.  History of migraines:Continue Topamax, Imitrex.  Also takes monthly injection of Aimovig.  Spinal stenosis/chronic back pain: -Patient underwent back surgeries and has implants. Follows up at pain clinic. -Chronically on MS Contin, Neurontin, Flexeril.  -All resumed.  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  Morbid obesity - Body mass index is 40.17  kg/m. Patient has been advised to make an attempt to  improve diet and exercise patterns to aid in weight loss.  Stable for discharge to home today  Subjective:  Seen and examined this morning.  Lying in bed.  Not in distress.  Pain controlled.  Wound dressed by podiatry yesterday.   Discharge Exam:   Vitals:   05/20/19 1421 05/20/19 2208 05/21/19 0635 05/21/19 0958  BP: 133/78 (!) 143/88 127/80 123/71  Pulse: 70 68 67 63  Resp: '18 16 16   '$ Temp: 98.3 F (36.8 C) 98.2 F (36.8 C) 97.7 F (36.5 C)   TempSrc: Oral Oral Oral   SpO2: 97% 98% 93%   Weight:      Height:        Body mass index is 40.17 kg/m.  General exam: Appears calm and comfortable.  Skin: No rashes, lesions or ulcers. HEENT: Atraumatic, normocephalic, supple neck, no obvious bleeding Lungs: Clear to auscultation bilaterally CVS: Regular rate and rhythm, no murmur GI/Abd soft, nontender, nondistended normal bowel sound present CNS: Alert, awake monitor x3 Psychiatry: Mood appropriate Extremities: No edema, no calf tenderness, bandaged right foot post TMA  Discharge Instructions:  Wound care: Home health nurse Discharge Instructions    Call MD for:  severe uncontrolled pain   Complete by: As directed    Call MD for:  temperature >100.4   Complete by: As directed    Diet Carb Modified   Complete by: As directed    Increase activity slowly   Complete by: As directed      [place] Follow up.   Contact information: [address] [place] [address] [phone]          Allergies as of 05/21/2019      Reactions   Penicillins Hives, Rash   No anaphylactic or severe cutaneous Sx > 10 yr ago, no additional medical attention required Tolerates Keflex and Rocephin   Tetracyclines & Related Other (See Comments)   Syncope and put her "in a coma"   Tramadol Other (See Comments)   Seizures   Phenazopyridine Other (See Comments)   Unknown   Tetracycline Other (See  Comments)   Syncope and "Put me in a coma"   Ciprofloxacin Rash, Itching   Codeine Itching, Rash   Estradiol Rash   Patches broke out the skin      Medication List    STOP taking these medications   levofloxacin 500 MG tablet Commonly known as: LEVAQUIN   sulfamethoxazole-trimethoprim 800-160 MG tablet Commonly known as: Bactrim DS     TAKE these medications   Aimovig 70 MG/ML Soaj Generic drug: Erenumab-aooe Inject 70 mg into the skin every 30 (thirty) days.   Artificial Tears PF 0.1-0.3 % Soln Generic drug: Dextran 70-Hypromellose (PF) Place 1-2 drops into both eyes 3 (three) times daily as needed (for dry eyes).   busPIRone 15 MG tablet Commonly known as: BUSPAR Take 15 mg by mouth 3 (three) times daily.   CALCIUM 1200 PO Take 1,200 mg by mouth daily.   cephALEXin 500 MG capsule Commonly known as: KEFLEX Take 1 capsule (500 mg total) by mouth every 8 (eight) hours for 7 days.   cyclobenzaprine 10 MG tablet Commonly known as: FLEXERIL Take 10 mg by mouth 2 (two) times daily as needed for muscle spasms.   DULoxetine 60 MG capsule Commonly known as: CYMBALTA TAKE 1 CAPSULE BY MOUTH EVERY DAY What changed:   how much to take  when to take this   escitalopram 5 MG tablet Commonly  known as: LEXAPRO Take 5 mg by mouth daily.   estradiol 1 MG tablet Commonly known as: ESTRACE TAKE 1 TABLET BY MOUTH EVERY DAY What changed: when to take this   fluticasone 50 MCG/ACT nasal spray Commonly known as: FLONASE SPRAY 2 SPRAYS INTO EACH NOSTRIL EVERY DAY What changed: See the new instructions.   gabapentin 600 MG tablet Commonly known as: NEURONTIN Take 600 mg by mouth 4 (four) times daily.   haloperidol 5 MG tablet Commonly known as: HALDOL Take 1 tablet (5 mg total) by mouth at bedtime. For psychosis   levocetirizine 5 MG tablet Commonly known as: XYZAL TAKE 1 TABLET BY MOUTH EVERY DAY IN THE EVENING What changed:   how much to take  how to take  this   Linzess 290 MCG Caps capsule Generic drug: linaclotide TAKE 1 CAPSULE BY MOUTH EVERY DAY IN THE MORNING BEFORE BREAKFAST What changed: See the new instructions.   linaclotide 290 MCG Caps capsule Commonly known as: Linzess TAKE 1 CAPSULE EVERY MORNING BEFORE BREAKFAST What changed: Another medication with the same name was changed. Make sure you understand how and when to take each.   meloxicam 15 MG tablet Commonly known as: MOBIC Take 15 mg by mouth at bedtime.   metoprolol succinate 25 MG 24 hr tablet Commonly known as: TOPROL-XL Take 1 tablet (25 mg total) by mouth daily.   morphine 30 MG 12 hr tablet Commonly known as: MS CONTIN Take 30 mg by mouth every 8 (eight) hours.   multivitamin with minerals tablet Take 1 tablet by mouth daily.   mupirocin ointment 2 % Commonly known as: BACTROBAN Apply 1 application topically 2 (two) times daily.   Narcan 4 MG/0.1ML Liqd nasal spray kit Generic drug: naloxone Place 1 spray into the nose once.   nystatin powder Commonly known as: MYCOSTATIN/NYSTOP APPLY TO AFFECTED AREA 4 TIMES A DAY What changed: See the new instructions.   pantoprazole 40 MG tablet Commonly known as: PROTONIX TAKE 1 TABLET BY MOUTH TWICE A DAY   potassium chloride SA 20 MEQ tablet Commonly known as: Klor-Con M20 TAKE TWO TABLETS (40 MEQ) BY MOUTH EVERY DAY What changed:   how much to take  how to take this  when to take this  additional instructions   ProAir HFA 108 (90 Base) MCG/ACT inhaler Generic drug: albuterol Inhale 2 puffs = 140mg into the lungs every 6 (six) hours as needed for wheezing or shortness of breath. What changed: See the new instructions.   promethazine 25 MG tablet Commonly known as: PHENERGAN Take 1 tablet (25 mg total) by mouth every 8 (eight) hours as needed for nausea or vomiting.   promethazine 25 MG tablet Commonly known as: PHENERGAN Take 1 tablet (25 mg total) by mouth every 8 (eight) hours as  needed for nausea or vomiting.   silver sulfADIAZINE 1 % cream Commonly known as: Silvadene Apply 1 application topically daily.   SUMAtriptan 50 MG tablet Commonly known as: IMITREX TAKE 1 TAB EVERY 2 HOURS AS NEEDED FOR MIGRAINE. MAY REPEAT IN 2 HOURS IF PERSISTS OR RECURS What changed: See the new instructions.   topiramate 50 MG tablet Commonly known as: TOPAMAX TAKE 1 TABLET BY MOUTH TWICE A DAY   traZODone 100 MG tablet Commonly known as: DESYREL Take 100 mg by mouth at bedtime.   Xiidra 5 % Soln Generic drug: Lifitegrast Place 1 drop into both eyes 2 (two) times daily.  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    Procedures and Diagnostic Studies:   MR FOOT RIGHT W WO CONTRAST  Result Date: 05/16/2019 CLINICAL DATA:  Pain, swelling and suspected osteomyelitis. EXAM: MRI OF THE RIGHT FOREFOOT WITHOUT AND WITH CONTRAST TECHNIQUE: Multiplanar, multisequence MR imaging of the right foot was performed before and after the administration of intravenous contrast. CONTRAST:  58m GADAVIST GADOBUTROL 1 MMOL/ML IV SOLN COMPARISON:  Radiographs 05/15/2019 FINDINGS: Bones/Joint/Cartilage Prior amputation of the second toe except for the base of the proximal phalanx. Chronic appearing ankylosis at the interphalangeal joint of the great toe. There is an open wound along the lateral aspect of the forefoot at the level of the fifth metatarsal head. This appears to extend right down to the bone. There is  associated osteomyelitis involving the distal shaft and head of the fifth metatarsal with surrounding inflammatory changes but no discrete drainable soft tissue abscess. I do not see any definite findings for septic arthritis and the proximal phalanx appears intact. Diffuse T1 and T2 signal abnormality in the proximal and distal phalanges of the great toe and subsequent contrast enhancement consistent with osteomyelitis. Is also diffuse surrounding cellulitis but no discrete soft tissue abscess. The other bony structures are intact. I do not see any areas of septic arthritis or pyomyositis. IMPRESSION: 1. Open wound along the lateral aspect of the forefoot at the level of the fifth metatarsal head. There is underlying osteomyelitis involving the distal shaft and head of the fifth metatarsal. 2. Osteomyelitis involving proximal and distal phalanges of the great toe. 3. Cellulitis but no discrete soft tissue abscess. No findings for pyomyositis. 4. 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 Electronically Signed   By: PMarijo SanesM.D.   On: 05/16/2019 11:31   DG Foot Complete Right  Result Date: 05/15/2019 Please see detailed radiograph report in office note.    Labs:   Basic Metabolic Panel: Recent Labs  Lab 05/15/19 1401 05/15/19 1401 05/16/19 0342 05/19/19 0826  NA 137  --  138 140  K 3.7   < > 3.8 3.4*  CL 104  --  105 107  CO2 25  --  25 25  GLUCOSE 90  --  99 107*  BUN 9  --  9 8  CREATININE 0.71  --  0.71 0.53  CALCIUM 9.4  --  8.8* 8.6*  MG  --   --   --  2.1  PHOS  --   --   --  4.5   < > = values in this interval not displayed.   GFR Estimated Creatinine Clearance: 94.5 mL/min (by C-G formula based on SCr of 0.53 mg/dL). Liver Function Tests: Recent Labs  Lab 05/16/19 0342  AST 16  ALT 15  ALKPHOS 64  BILITOT 0.6  PROT 6.3*  ALBUMIN 3.3*   No results for input(s): LIPASE, AMYLASE in the last 168  hours. No results for input(s):  AMMONIA in the last 168 hours. Coagulation profile Recent Labs  Lab 05/16/19 0342  INR 1.1    CBC: Recent Labs  Lab 05/15/19 1401 05/15/19 1627 05/16/19 0342 05/19/19 0826  WBC 7.2 6.7 5.8 7.2  NEUTROABS  --   --   --  4.4  HGB 11.4* 10.4* 10.6* 9.4*  HCT 35.2* 32.9* 32.3* 28.9*  MCV 98.9 99.4 97.6 99.0  PLT 310 327 280 255   Cardiac Enzymes: No results for input(s): CKTOTAL, CKMB, CKMBINDEX, TROPONINI in the last 168 hours. BNP: Invalid input(s): POCBNP CBG: Recent Labs  Lab 05/20/19 0747 05/20/19 1136 05/20/19 1635 05/20/19 2209 05/21/19 0807  GLUCAP 81 97 100* 95 90   D-Dimer No results for input(s): DDIMER in the last 72 hours. Hgb A1c No results for input(s): HGBA1C in the last 72 hours. Lipid Profile No results for input(s): CHOL, HDL, LDLCALC, TRIG, CHOLHDL, LDLDIRECT in the last 72 hours. Thyroid function studies No results for input(s): TSH, T4TOTAL, T3FREE, THYROIDAB in the last 72 hours.  Invalid input(s): FREET3 Anemia work up No results for input(s): VITAMINB12, FOLATE, FERRITIN, TIBC, IRON, RETICCTPCT in the last 72 hours. Microbiology Recent Results (from the past 240 hour(s))  SARS CORONAVIRUS 2 (TAT 6-24 HRS) Nasopharyngeal Nasopharyngeal Swab     Status: None   Collection Time: 05/15/19  2:01 PM   Specimen: Nasopharyngeal Swab  Result Value Ref Range Status   SARS Coronavirus 2 NEGATIVE NEGATIVE Final    Comment: (NOTE) SARS-CoV-2 target nucleic acids are NOT DETECTED. The SARS-CoV-2 RNA is generally detectable in upper and lower respiratory specimens during the acute phase of infection. Negative results do not preclude SARS-CoV-2 infection, do not rule out co-infections with other pathogens, and should not be used as the sole basis for treatment or other patient management decisions. Negative results must be combined with clinical observations, patient history, and epidemiological information. The expected result is Negative. Fact  Sheet for Patients: SugarRoll.be Fact Sheet for Healthcare Providers: https://www.woods-mathews.com/ This test is not yet approved or cleared by the [place] FDA and  has been authorized for detection and/or diagnosis of SARS-CoV-2 by FDA under an Emergency Use Authorization (EUA). This EUA will remain  in effect (meaning this test can be used) for the duration of the COVID-19 declaration under Section 56 4(b)(1) of the Act, 21 U.S.C. section 360bbb-3(b)(1), unless the authorization is terminated or revoked sooner. Performed at [facility], [place] [address], [place], [place] [address]   Blood culture (routine x 2)     Status: None   Collection Time: 05/15/19  2:45 PM   Specimen: BLOOD LEFT ARM  Result Value Ref Range Status   Specimen Description   Final    BLOOD LEFT ARM Performed at [facility] [address], [place], [place] [address]    Special Requests   Final    BOTTLES DRAWN AEROBIC AND ANAEROBIC Blood Culture adequate volume Performed at [facility] [address], [place], [place] [address]    Culture   Final    NO GROWTH 5 DAYS Performed at [facility], [place] [address] [place], [place] [address]    Report Status 05/20/2019 FINAL  Final  Blood culture (routine x 2)     Status: Abnormal   Collection Time: 05/15/19  2:45 PM   Specimen: BLOOD  Result Value Ref Range Status   Specimen Description   Final    BLOOD LEFT  ANTECUBITAL Performed at [facility], [place] [address], [place], [place] [address]    Special Requests   Final    BOTTLES DRAWN AEROBIC AND ANAEROBIC Blood Culture adequate volume Performed at [facility] [address] [place], [place] [address]    Culture  Setup Time   Final    GRAM POSITIVE COCCI IN CLUSTERS AEROBIC BOTTLE ONLY CRITICAL RESULT CALLED TO, READ BACK BY AND VERIFIED WITH: Shelda Jakes [id number]  05/16/19 A BROWNING    Culture (A)  Final    STAPHYLOCOCCUS SPECIES (COAGULASE NEGATIVE) THE SIGNIFICANCE OF ISOLATING THIS ORGANISM FROM A SINGLE SET OF BLOOD CULTURES WHEN MULTIPLE SETS ARE DRAWN IS UNCERTAIN. PLEASE NOTIFY THE MICROBIOLOGY DEPARTMENT WITHIN ONE WEEK IF SPECIATION AND SENSITIVITIES ARE REQUIRED. Performed at [facility], [place] [address] [place], [place] [address]    Report Status 05/17/2019 FINAL  Final  Culture, blood (routine x 2)     Status: None (Preliminary result)   Collection Time: 05/17/19  2:02 PM   Specimen: BLOOD  Result Value Ref Range Status   Specimen Description   Final    BLOOD RIGHT ARM Performed at [facility] [address], [place], [place] [address]    Special Requests   Final    BOTTLES DRAWN AEROBIC AND ANAEROBIC Blood Culture adequate volume Performed at [facility] [address] [place], [place] [address]    Culture   Final    NO GROWTH 3 DAYS Performed at [facility], [place] [address] [place], [place] [address]    Report Status PENDING  Incomplete  Culture, blood (routine x 2)     Status: None (Preliminary result)   Collection Time: 05/17/19  2:09 PM   Specimen: BLOOD  Result Value Ref Range Status   Specimen Description   Final    BLOOD RIGHT ARM Performed at [facility] [address], [place], [place] [address]    Special Requests   Final    BOTTLES DRAWN AEROBIC AND ANAEROBIC Blood Culture adequate volume Performed at [facility] [address], [place], [place] [address]    Culture   Final    NO GROWTH 3 DAYS Performed at [facility], [place] [address], [place], [place] [address]    Report Status PENDING  Incomplete  MRSA PCR Screening     Status: None   Collection Time: 05/17/19 11:09 PM   Specimen: Nasal Mucosa; Nasopharyngeal  Result Value Ref Range Status   MRSA by PCR NEGATIVE NEGATIVE Final    Comment:        The  GeneXpert MRSA Assay (FDA approved for NASAL specimens only), is one component of a comprehensive MRSA colonization surveillance program. It is not intended to diagnose MRSA infection nor to guide or monitor treatment for MRSA infections. Performed at [facility], [place] [address] [place], [place] [address]   Aerobic/Anaerobic Culture (surgical/deep wound)     Status: None (Preliminary result)   Collection Time: 05/18/19 10:49 AM   Specimen: PATH Amputaion Arm/Leg; Tissue  Result Value Ref Range Status   Specimen Description   Final    TISSUE TRANSMETATARSAL Performed at [facility] [address] [place], [place] [address]    Special Requests   Final    PATIENT ON FOLLOWING VANCOMYCIN 750 Performed at [facility], [place] [address], [place], [place] [address]    Gram Stain  Final    RARE WBC PRESENT, PREDOMINANTLY MONONUCLEAR NO ORGANISMS SEEN    Culture   Final    NO GROWTH 2 DAYS Performed at [facility] [address], [place], [place] [address]    Report Status PENDING  Incomplete    Please note: You were cared for by a hospitalist during your hospital stay. Once you are discharged, your primary care physician will handle any further medical issues. Please note that NO REFILLS for any discharge medications will be authorized once you are discharged, as it is imperative that you return to your primary care physician (or establish a relationship with a primary care physician if you do not have one) for your post hospital discharge needs so that they can reassess your need for medications and monitor your lab values.  Signed: Marlowe Aschoff Rosario Kushner  [facility] 05/21/2019, 10:42 AM

## 2019-05-21 NOTE — TOC Progression Note (Signed)
Transition of Care [facility]) - Progression Note    Patient Details  Name: [name] [name] MRN: [id number] Date of Birth: 18-Jun-1965  Transition of Care [facility]) CM/SW Contact  Leeroy Cha, RN Phone Number: 05/21/2019, 3:55 PM  Clinical Narrative:    Lajean Manes isu ab;e to do pt and rn/ [facility] .   Expected Discharge Plan: [place] Barriers to Discharge: No Barriers Identified  Expected Discharge Plan and Services Expected Discharge Plan: [place]   Discharge Planning Services: CM Consult Post Acute Care Choice: [place] arrangements for the past 2 months: Single Family Home Expected Discharge Date: 05/21/19               DME Arranged: 3-N-1 DME Agency: [employer] Date DME Agency Contacted: 05/20/19 Time DME Agency Contacted: 17 Representative spoke with at DME Agency: [place]: RN, PT [place] Agency: [facility] Date [place]: 05/21/19 Time [place]: 1 Representative spoke with at [place]: [facility]   Social Determinants of Health ([place]) Interventions    Readmission Risk Interventions No flowsheet data found.

## 2019-05-21 NOTE — Plan of Care (Signed)

## 2019-05-21 NOTE — Care Management Important Message (Signed)
Important Message  Patient Details IM Letter given to Velva Harman RN Case Manager to present to the Patient Name: [name] MRN: [id number] Date of Birth: 05-17-1965   Medicare Important Message Given:  Yes     Kerin Salen 05/21/2019, 11:03 AM

## 2019-05-21 NOTE — Progress Notes (Signed)
Physical Therapy Treatment Patient Details Name: [name] [name] MRN: [id number] DOB: [birth year] Today's Date: 05/21/2019    History of Present Illness Pt s/p R transmetatarsal amputation 2* osteomyelitis.  Pt with hx of back surgery, depression, bipolar and seizures    PT Comments    POD # 4 Assisted OOB to amb and practice stairs for D/C to home today.    Follow Up Recommendations  Home health PT     Equipment Recommendations  3in1 (PT);Rolling walker with 5" wheels    Recommendations for Other Services       Precautions / Restrictions Precautions Precautions: Fall Restrictions Weight Bearing Restrictions: Yes RLE Weight Bearing: Non weight bearing Other Position/Activity Restrictions: with cam boot in place    Mobility  Bed Mobility Overal bed mobility: Needs Assistance Bed Mobility: Supine to Sit     Supine to sit: Supervision     General bed mobility comments: increased time  Transfers Overall transfer level: Needs assistance Equipment used: Rolling walker (2 wheeled) Transfers: Sit to/from Stand Sit to Stand: Supervision;Min guard Stand pivot transfers: Min guard       General transfer comment: steady assist; cues for use of UEs and for R LE management to limit WB:   Ambulation/Gait Ambulation/Gait assistance: Min guard;Min assist Gait Distance (Feet): 16 Feet Assistive device: Rolling walker (2 wheeled) Gait Pattern/deviations: Step-to pattern;Decreased step length - right;Decreased step length - left;Shuffle;Trunk flexed Gait velocity: decr   General Gait Details: decreased distance due to "my arms are getting tired" maintaining NWB "hopping" a functional distance   Stairs   Stairs assistance: Min assist Stair Management: No rails Number of Stairs: 2 General stair comments: 2 stairs  with RW bkwd with cues for sequence, foot placement and TDWB required to safely negotiate stairs   Wheelchair Mobility    Modified Rankin (Stroke Patients  Only)       Balance                                            Cognition   Behavior During Therapy: WFL for tasks assessed/performed Overall Cognitive Status: Within Functional Limits for tasks assessed                                        Exercises      General Comments        Pertinent Vitals/Pain Pain Assessment: No/denies pain    Home Living                      Prior Function            PT Goals (current goals can now be found in the care plan section) Progress towards PT goals: Progressing toward goals    Frequency    Min 6X/week      PT Plan Current plan remains appropriate    Co-evaluation              AM-PAC PT "6 Clicks" Mobility   Outcome Measure  Help needed turning from your back to your side while in a flat bed without using bedrails?: A Little Help needed moving from lying on your back to sitting on the side of a flat bed without using bedrails?: A Little Help needed moving to and from  a bed to a chair (including a wheelchair)?: A Little Help needed standing up from a chair using your arms (e.g., wheelchair or bedside chair)?: A Little Help needed to walk in hospital room?: A Little Help needed climbing 3-5 steps with a railing? : A Lot 6 Click Score: 17    End of Session Equipment Utilized During Treatment: Gait belt Activity Tolerance: Patient limited by fatigue Patient left: in chair;with call bell/phone within reach;with chair alarm set Nurse Communication: Mobility status PT Visit Diagnosis: Difficulty in walking, not elsewhere classified (R26.2)     Time: [id number] PT Time Calculation (min) (ACUTE ONLY): 24 min  Charges:  $Gait Training: 8-22 mins $Therapeutic Activity: 8-22 mins                     Rica Koyanagi  PTA Acute  Rehabilitation Services Pager      [phone] Office      [phone]

## 2019-05-21 NOTE — Telephone Encounter (Signed)
Left message per Dr Jacqualyn Posey about if nurse coming to house to help pt with drain. Appt with Dr March Rummage for Thursday may need to be changed to Wednesday with Dr Posey Pronto but to call me back to discuss.

## 2019-05-22 LAB — CULTURE, BLOOD (ROUTINE X 2)
Culture: NO GROWTH
Culture: NO GROWTH
Special Requests: ADEQUATE
Special Requests: ADEQUATE

## 2019-05-23 LAB — AEROBIC/ANAEROBIC CULTURE W GRAM STAIN (SURGICAL/DEEP WOUND): Culture: NO GROWTH

## 2019-05-23 NOTE — Telephone Encounter (Signed)
Just let her know that if the drain gets full, which I doubt it will, to let us know.

## 2019-05-23 NOTE — Telephone Encounter (Signed)
Pt left message on 2.2.2021 @ 444pm that she was returning my call and that she wanted to keep her appt for Thursday.

## 2019-05-24 ENCOUNTER — Ambulatory Visit (INDEPENDENT_AMBULATORY_CARE_PROVIDER_SITE_OTHER): Payer: 59 | Admitting: Podiatry

## 2019-05-24 ENCOUNTER — Other Ambulatory Visit: Payer: Self-pay

## 2019-05-24 ENCOUNTER — Ambulatory Visit: Payer: 59

## 2019-05-24 DIAGNOSIS — L97514 Non-pressure chronic ulcer of other part of right foot with necrosis of bone: Secondary | ICD-10-CM

## 2019-05-28 ENCOUNTER — Encounter: Payer: Self-pay | Admitting: Podiatry

## 2019-05-28 ENCOUNTER — Ambulatory Visit (INDEPENDENT_AMBULATORY_CARE_PROVIDER_SITE_OTHER): Payer: 59 | Admitting: Podiatry

## 2019-05-28 ENCOUNTER — Other Ambulatory Visit: Payer: Self-pay

## 2019-05-28 VITALS — BP 142/91 | HR 95 | Temp 97.1°F | Resp 16

## 2019-05-28 DIAGNOSIS — L02612 Cutaneous abscess of left foot: Secondary | ICD-10-CM

## 2019-05-28 DIAGNOSIS — L03032 Cellulitis of left toe: Secondary | ICD-10-CM

## 2019-05-28 DIAGNOSIS — L97514 Non-pressure chronic ulcer of other part of right foot with necrosis of bone: Secondary | ICD-10-CM

## 2019-05-28 MED ORDER — CEPHALEXIN 500 MG PO CAPS: 500.0000 mg | ORAL_CAPSULE | Freq: Three times a day (TID) | ORAL | 0 refills | Status: AC

## 2019-05-28 NOTE — Progress Notes (Signed)
Subjective: [name] [name] is a 54 y.o. is seen today in office s/p right foot TMA preformed on 05/18/2019.  She states that she is doing well and her pain is controlled and not any significant pain.  She is on the Keflex and finished this today.  She is try to be nonweightbearing much as possible she presents today walking in the cam boot with a walker.  Home health care has not yet been to the house.  Drain was removed last week.  Denies any systemic complaints such as fevers, chills, nausea, vomiting. No calf pain, chest pain, shortness of breath.   Objective: General: No acute distress, AAOx3  DP/PT pulses palpable 2/4, CRT < 3 sec to all digits.  Protective sensation intact. Motor function intact.  RIGHT foot: Incision is well coapted without any evidence of dehiscence sutures, staples intact.  There is mild erythema to the distal aspect of the incision but there is no ascending cellulitis.  There is no fluctuation crepitation there is no purulence.      No other areas of tenderness to bilateral lower extremities.  No other open lesions or pre-ulcerative lesions.  No pain with calf compression, swelling, warmth, erythema.   Assessment and Plan:  Status post Right foot TMA, doing well with no complications   -Treatment options discussed including all alternatives, risks, and complications -Based on the picture the redness is much improved compared to last week.  Wound continue antibiotics and refilled Keflex.  Encouraged elevation and nonweightbearing tries to operative foot is much as possible. -Antibiotic ointment and a dressing was applied to the incision.  We will try to set up home health care for 3 times a week dressing changes with similar dressing. -Pain medication as needed. -Monitor for any clinical signs or symptoms of infection and DVT/PE and directed to call the office immediately should any occur or go to the ER. -Follow-up in 1 week or sooner if any problems arise. In the  meantime, encouraged to call the office with any questions, concerns, change in symptoms.   Celesta Gentile, DPM'

## 2019-05-28 NOTE — Progress Notes (Signed)
  Subjective:  Patient ID: RIELLY NOFTZ, female    DOB: 1965/11/15,  MRN: [id number]  Chief Complaint  Patient presents with  . Routine Post Op    pov # 1 right transmet amp/ drain removal, pt states that she is ready for her drain to be removed, pt also states that left foot pain is mild. Pt also has some redness around the surgical site. staples are intact as well.   DOS: 05/18/19 Procedure: Right foot Transmetatarsal amputation   54 y.o. female presents with the above complaint. History confirmed with patient. States that the wound drain has not had much drainage. She has emptied the bulb twice, states it only had about 10ccs each time.  Objective:  Physical Exam: no tenderness at the surgical site, local edema noted and calf supple, nontender. Incision: healing well, no significant drainage, no dehiscence, blancheable erythema without warmth, slight clear drainage present. Flaps warm and viable.    Assessment:   1. 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  Post-operative State -Drain suture cut and drain pulled. Drain end intact. -Culture from the hospital reviewed. Culture on 1/29 without growth.  -Continue keflex until completion. -Wound dressed with betadine WTD today. -Orders gien for home health care. Betadine WTD twice weekly. -Wound edge with redness but without active warmth. -F/u with Dr. Jacqualyn Posey next week.  No follow-ups on file.

## 2019-05-31 ENCOUNTER — Telehealth: Payer: Self-pay | Admitting: Podiatry

## 2019-05-31 ENCOUNTER — Telehealth: Payer: Self-pay | Admitting: *Deleted

## 2019-05-31 NOTE — Telephone Encounter (Signed)
[name] from [facility] called needing wound care orders and clarifications for the patient. Please give her a call back.

## 2019-05-31 NOTE — Telephone Encounter (Signed)
Please advise. Thanks.  

## 2019-05-31 NOTE — Telephone Encounter (Signed)
Received call from [place], [facility] with [facility].   Reports that patient was admitted to care S/P hospitalization and amputation of R toes. States that she was released home with no wound care orders.   Advised that patient is seeing Dr. Celesta Gentile at [place] and [place]. Advised that per chart notes from 05/28/2019, Dr. Jacqualyn Posey recommended: Antibiotic ointment and a dressing was applied to the incision.  We will try to set up home health care for 3 times a week dressing changes with similar dressing.  Advised to contact Dr. Jacqualyn Posey for future orders.   MD to be made aware.

## 2019-05-31 NOTE — Telephone Encounter (Signed)
Apply a small amount of betadine over the incision followed by 4x4, kerlix, ACE bandage.   Can you please send orders.

## 2019-06-04 ENCOUNTER — Telehealth: Payer: Self-pay | Admitting: *Deleted

## 2019-06-04 ENCOUNTER — Other Ambulatory Visit: Payer: Self-pay

## 2019-06-04 ENCOUNTER — Ambulatory Visit (INDEPENDENT_AMBULATORY_CARE_PROVIDER_SITE_OTHER): Payer: 59 | Admitting: Podiatry

## 2019-06-04 DIAGNOSIS — L97514 Non-pressure chronic ulcer of other part of right foot with necrosis of bone: Secondary | ICD-10-CM

## 2019-06-04 NOTE — Telephone Encounter (Signed)
Left message for [name] from home health called about the wound care orders and relayed the orders per Dr Jacqualyn Posey and stated to call the office if any concerns or questions at [phone]. [name] [name]

## 2019-06-05 ENCOUNTER — Telehealth: Payer: Self-pay | Admitting: Podiatry

## 2019-06-05 ENCOUNTER — Other Ambulatory Visit: Payer: Self-pay | Admitting: Family Medicine

## 2019-06-05 NOTE — Telephone Encounter (Signed)
Jaylene from [facility] called requesting a call back from Dr. Leigh Aurora nurse regarding patient. Please give them a call.

## 2019-06-05 NOTE — Telephone Encounter (Signed)
Pt called ans stated she spoke to pain management and they told pt that Dr.Wagoner needed to fill the pain medication

## 2019-06-05 NOTE — Telephone Encounter (Signed)
Called and left a message for [name] [name] to call me back about the patient. Lattie Haw

## 2019-06-05 NOTE — Telephone Encounter (Signed)
Please Advise? Thanks Lattie Haw

## 2019-06-06 ENCOUNTER — Other Ambulatory Visit: Payer: Self-pay | Admitting: Podiatry

## 2019-06-06 MED ORDER — OXYCODONE-ACETAMINOPHEN 5-325 MG PO TABS: 1.0000 | ORAL_TABLET | Freq: Four times a day (QID) | ORAL | 0 refills | Status: DC | PRN

## 2019-06-06 NOTE — Progress Notes (Signed)
I called the patient and since I did the surgery we would need to call in medication for break through pain. We discussed this at her last appointment. I called in percocet to take between the morphine and only as needed. She verbalized understanding.

## 2019-06-12 ENCOUNTER — Ambulatory Visit (INDEPENDENT_AMBULATORY_CARE_PROVIDER_SITE_OTHER): Payer: 59 | Admitting: Podiatry

## 2019-06-12 ENCOUNTER — Other Ambulatory Visit: Payer: Self-pay

## 2019-06-12 ENCOUNTER — Encounter: Payer: Self-pay | Admitting: Podiatry

## 2019-06-12 VITALS — BP 137/76 | HR 92 | Temp 97.7°F | Resp 16

## 2019-06-12 DIAGNOSIS — L03115 Cellulitis of right lower limb: Secondary | ICD-10-CM

## 2019-06-12 DIAGNOSIS — Z9889 Other specified postprocedural states: Secondary | ICD-10-CM

## 2019-06-12 MED ORDER — OXYCODONE-ACETAMINOPHEN 5-325 MG PO TABS: 1.0000 | ORAL_TABLET | Freq: Four times a day (QID) | ORAL | 0 refills | Status: DC | PRN

## 2019-06-12 MED ORDER — CEPHALEXIN 500 MG PO CAPS: 500.0000 mg | ORAL_CAPSULE | Freq: Three times a day (TID) | ORAL | 0 refills | Status: DC

## 2019-06-12 NOTE — Progress Notes (Signed)
Subjective: 54 year old female presents the office today for follow-up evaluation after undergoing right foot transmetatarsal amputation was performed on May 18, 2019.  She says overall she has been doing well and the boot has been helpful.  Home health care has been coming out as well for dressing changes.  He has noticed a small amount of redness.  Although is much improved she is asking about going back on antibiotics.  Did not see any drainage or pus or any increase in swelling or redness. Denies any systemic complaints such as fevers, chills, nausea, vomiting. No acute changes since last appointment, and no other complaints at this time.   Objective: AAO x3, NAD DP/PT pulses palpable bilaterally, CRT less than 3 seconds Right foot status post transmetatarsal imitation with sutures, staples intact.  There is mild surrounding erythema the distal amputation site but there is no ascending cellulitis.  Not able to identify any drainage or pus there is no fluctuation or crepitation.  There is no malodor.  No significant warmth of the foot. No open lesions or pre-ulcerative lesions.  No pain with calf compression, swelling, warmth, erythema     (The pictures make the erythema appear worse than it did in person).   Assessment: Status post right foot transmetatarsal rotation with mild erythema  Plan: -All treatment options discussed with the patient including all alternatives, risks, complications.  -I related to the sutures today.  Given the erythema prescribed Keflex.  She did well with this previously.  Small amount of antibiotic ointment was applied followed by dressing.  Continue a cam boot and try to limit weightbearing, encouraged elevation.  We will continue home health care dressing changes. -Monitor for any clinical signs or symptoms of infection and directed to call the office immediately should any occur or go to the ER. -Patient encouraged to call the office with any questions,  concerns, change in symptoms.   [name] DPM

## 2019-06-14 ENCOUNTER — Telehealth: Payer: Self-pay | Admitting: *Deleted

## 2019-06-14 NOTE — Telephone Encounter (Signed)
-----   Message from [name], DPM sent at 06/12/2019 11:11 AM EST ----- It is the same. Apply a small amount of betadine to the incision followed by a dry sterile dressing. Thanks.  ----- Message ----- From: Andres Ege, RN Sent: 06/12/2019  10:21 AM EST To: [name], DPM  Dr. Jacqualyn Posey, I see pt is in-office today. Please let me know of your new HHC orders, I do not believe these were sent to [facility] in my absence. Marcy Siren ----- Message ----- From: [name], DPM Sent: 05/28/2019   1:25 PM EST To: Andres Ege, RN  Can you send updated home health orders to apply a small amount of betadine to the incision followed by a dry sterile dressing.

## 2019-06-14 NOTE — Telephone Encounter (Signed)
Faxed copy of Dr. Leigh Aurora 06/12/2019 11:11am orders to [facility].

## 2019-06-14 NOTE — Progress Notes (Signed)
Subjective: [name] [name] is a 54 y.o. is seen today in office s/p right foot TMA preformed on 05/18/2019.  While she states that she is doing well.  She is off of antibiotics.  Home health has been coming out.  She is trying to stay off her foot as much as possible and she has been using the cam boot but she is walking in the cam boot. Denies any systemic complaints such as fevers, chills, nausea, vomiting. No calf pain, chest pain, shortness of breath.   Objective: General: No acute distress, AAOx3  DP/PT pulses palpable 2/4, CRT < 3 sec to all digits.  Protective sensation intact. Motor function intact.  RIGHT foot: Incision is well coapted without any evidence of dehiscence sutures, staples intact.  There is still some mild erythema is improved compared to last appointment.  There is no drainage or pus identified.  There is no fluctuation crepitation.  No ascending cellulitis.  No malodor.  No other open lesions or pre-ulcerative lesions.  No pain with calf compression, swelling, warmth, erythema.   Assessment and Plan:  Status post Right foot TMA, doing well with no complications   -Treatment options discussed including all alternatives, risks, and complications -I removed a few of the sutures today.  Betadine was painted the incision followed by dry sterile dressing.  Continue home dressing changes.  Continue to stay off the foot as much as possible and wear the cam boot.  Elevation. -Monitor for any clinical signs or symptoms of infection and directed to call the office immediately should any occur or go to the ER.  Return in about 1 week (around 06/11/2019).  [name] DPM

## 2019-06-18 ENCOUNTER — Other Ambulatory Visit: Payer: Self-pay | Admitting: Podiatry

## 2019-06-18 ENCOUNTER — Telehealth: Payer: Self-pay | Admitting: *Deleted

## 2019-06-18 MED ORDER — OXYCODONE-ACETAMINOPHEN 5-325 MG PO TABS: 1.0000 | ORAL_TABLET | Freq: Four times a day (QID) | ORAL | 0 refills | Status: DC | PRN

## 2019-06-18 NOTE — Telephone Encounter (Signed)
Pt states she would like a refill of the oxycodone.

## 2019-06-18 NOTE — Telephone Encounter (Signed)
I spoke with pt and she states she has a burning pain in the side and top of the foot where the sutures are and it is constant. I told pt that she could remove the boot, open-ended sock, and ace wrap, then elevate the foot for 15 minutes, after 15 minutes then place level with hip and rewrap loosely beginning at the base of the toes and roll down the foot and up the leg, replace the boot and sock. I told pt I would inform Dr. Jacqualyn Posey of her request.

## 2019-06-18 NOTE — Telephone Encounter (Signed)
I spoke with pt and infaormed that the pain medication had been called to the pharmacy and asked if she had fever or chills, increased redness or streaking from the site and pt denied.

## 2019-06-18 NOTE — Telephone Encounter (Signed)
I have sent it to the pharmacy. Can you also make sure that she is not having any fevers, chills or any other systemic symptoms? Also I want to make sure there is no increase in redness.

## 2019-06-21 ENCOUNTER — Telehealth: Payer: Self-pay | Admitting: *Deleted

## 2019-06-21 ENCOUNTER — Ambulatory Visit (INDEPENDENT_AMBULATORY_CARE_PROVIDER_SITE_OTHER): Payer: 59 | Admitting: Podiatry

## 2019-06-21 ENCOUNTER — Other Ambulatory Visit: Payer: Self-pay

## 2019-06-21 ENCOUNTER — Encounter: Payer: Self-pay | Admitting: Podiatry

## 2019-06-21 VITALS — Temp 97.6°F

## 2019-06-21 DIAGNOSIS — Z9889 Other specified postprocedural states: Secondary | ICD-10-CM

## 2019-06-21 DIAGNOSIS — L97511 Non-pressure chronic ulcer of other part of right foot limited to breakdown of skin: Secondary | ICD-10-CM

## 2019-06-21 MED ORDER — OXYCODONE-ACETAMINOPHEN 5-325 MG PO TABS: 1.0000 | ORAL_TABLET | Freq: Four times a day (QID) | ORAL | 0 refills | Status: DC | PRN

## 2019-06-21 NOTE — Telephone Encounter (Signed)
sent 

## 2019-06-21 NOTE — Telephone Encounter (Signed)
Pt called states she would like her pain medication sent to the [facility] [address] in [place].

## 2019-06-21 NOTE — Telephone Encounter (Signed)
I informed pt that Dr. Jacqualyn Posey had changed the pain medication to the [facility] [address].

## 2019-06-21 NOTE — Telephone Encounter (Signed)
Pt states she would like to remind Dr. Jacqualyn Posey to send in her pain medication.

## 2019-06-24 ENCOUNTER — Other Ambulatory Visit: Payer: Self-pay | Admitting: Family Medicine

## 2019-06-26 ENCOUNTER — Telehealth: Payer: Self-pay | Admitting: *Deleted

## 2019-06-26 NOTE — Telephone Encounter (Signed)
Pt called states she would like the pain medication sent to the [facility] [address].

## 2019-06-26 NOTE — Telephone Encounter (Signed)
I spoke with pt and told her I changed her pharmacy to the [facility] [address] and to describe the type of pain she is having. Pt states she is having stabbing shooting pain in the foot. I asked if she had any increased swelling and redness or drainage and she stated the foot looks good.

## 2019-06-27 ENCOUNTER — Other Ambulatory Visit: Payer: Self-pay | Admitting: Podiatry

## 2019-06-27 MED ORDER — OXYCODONE-ACETAMINOPHEN 5-325 MG PO TABS: 1.0000 | ORAL_TABLET | Freq: Three times a day (TID) | ORAL | 0 refills | Status: DC | PRN

## 2019-06-27 NOTE — Telephone Encounter (Signed)
Refilled. Thanks!

## 2019-06-28 ENCOUNTER — Other Ambulatory Visit: Payer: Self-pay

## 2019-06-28 ENCOUNTER — Ambulatory Visit (INDEPENDENT_AMBULATORY_CARE_PROVIDER_SITE_OTHER): Payer: 59 | Admitting: Podiatry

## 2019-06-28 ENCOUNTER — Encounter: Payer: Self-pay | Admitting: Podiatry

## 2019-06-28 VITALS — Temp 97.0°F

## 2019-06-28 DIAGNOSIS — L97511 Non-pressure chronic ulcer of other part of right foot limited to breakdown of skin: Secondary | ICD-10-CM

## 2019-06-28 DIAGNOSIS — Z9889 Other specified postprocedural states: Secondary | ICD-10-CM

## 2019-07-02 NOTE — Progress Notes (Signed)
Subjective: 54 year old female presents the office today for follow-up evaluation after undergoing right foot transmetatarsal amputation was performed on May 18, 2019.  She states that overall she is doing better.  She is asking for refill pain medication.  Her pain level is 5/10 is describing sharp pains.  She has been in the cam boot and try to limit weightbearing.  She has been elevating her foot. Denies any systemic complaints such as fevers, chills, nausea, vomiting. No acute changes since last appointment, and no other complaints at this time.   Objective: AAO x3, NAD DP/PT pulses palpable bilaterally, CRT less than 3 seconds Right foot status post transmetatarsal amputation with staples intact.  Erythema much improved.  There is no warmth of the foot there is no drainage or pus.  There is no tenderness to palpation today. No pain with calf compression, swelling, warmth, erythema   Assessment: Status post right foot transmetatarsal rotation with improved erythema  Plan: -All treatment options discussed with the patient including all alternatives, risks, complications.  -I removed the staples today.  The incision was healed well.  Recommended to keep the amputation site clean and dry.  Continue cam boot.  Still try stay off the foot as much as possible and recommended nonweightbearing. -Refill pain medication. -Monitor for any clinical signs or symptoms of infection and directed to call the office immediately should any occur or go to the ER.  Return in about 1 week (around 06/28/2019).  [name] DPM

## 2019-07-03 ENCOUNTER — Telehealth: Payer: Self-pay

## 2019-07-03 NOTE — Telephone Encounter (Signed)
Can you please call her pain management doctor to see if they are able to increase the gabapentin at all? I think that would do well for the sharp pain.

## 2019-07-03 NOTE — Telephone Encounter (Signed)
Left message for [facility] - Dr. Jodene Nam nurse, our Dr. Jacqualyn Posey would like to know if Dr. Vira Blanco would allow Dr. Jacqualyn Posey to increase pt's Gabapentin current dosage of 600mg  qid.

## 2019-07-03 NOTE — Telephone Encounter (Signed)
I spoke with pt and she states she sees Dr. Vira Blanco at [facility].

## 2019-07-03 NOTE — Telephone Encounter (Signed)
Pt called stating that she would like for Dr. Jacqualyn Posey to call her in a prescription for oxycodone. Pt stated that she is still having a sharp, stabbing pain in her foot.

## 2019-07-03 NOTE — Telephone Encounter (Signed)
I spoke with pt and asked if she had any signs of infection, increase redness, swelling, drainage with odor or red streaking or fever. Pt denied signs of infection, only having the pain.

## 2019-07-04 NOTE — Progress Notes (Signed)
Subjective: 54 year old female presents the office today for follow-up evaluation after undergoing right foot transmetatarsal amputation was performed on May 18, 2019.  Overall she states that she is doing well.  Incisions healing nicely.  Denies any drainage or any open sores or any redness or swelling to her foot.  She still describing sharp pains to her feet. Denies any systemic complaints such as fevers, chills, nausea, vomiting. No acute changes since last appointment, and no other complaints at this time.   Objective: AAO x3, NAD DP/PT pulses palpable bilaterally, CRT less than 3 seconds Right foot status post transmetatarsal amputation with eschar formed.  Incision appears to be healed there is no open lesions today.  There is no erythema or warmth.  Minimal edema. No pain with calf compression, swelling, warmth, erythema   Assessment: Status post right foot transmetatarsal amputation with resolved erythema  Plan: -All treatment options discussed with the patient including all alternatives, risks, complications.  -Incisions healed well.  Continue a cam boot for now but she weight-bear as tolerated.  We will have her follow-up with [facility] or [facility] for insert with toe filler and possibly shoes.  -Encouraged elevation.  RTC to see me in 3 weeks or sooner if needed  [name] DPM

## 2019-07-05 ENCOUNTER — Telehealth: Payer: Self-pay

## 2019-07-05 NOTE — Telephone Encounter (Signed)
Patient left a voice message, asking if we had heard back from Dr. Jodene Nam office about her gabapentin. I called the patient back and left a message advising that we did send a note to Dr. Vira Blanco, but that we had not had a response. Advised that she should call Dr. Jodene Nam office herself to ask about her medication.

## 2019-07-10 ENCOUNTER — Other Ambulatory Visit: Payer: Self-pay

## 2019-07-10 ENCOUNTER — Ambulatory Visit: Payer: 59 | Admitting: Orthotics

## 2019-07-10 DIAGNOSIS — Z9889 Other specified postprocedural states: Secondary | ICD-10-CM

## 2019-07-10 DIAGNOSIS — L97511 Non-pressure chronic ulcer of other part of right foot limited to breakdown of skin: Secondary | ICD-10-CM

## 2019-07-10 NOTE — Progress Notes (Signed)
Patient was cast today for toefiller/insert for TMA; she is non diabetic and Dawn will contact [facility] for prior auth.

## 2019-07-16 ENCOUNTER — Other Ambulatory Visit: Payer: 59 | Admitting: Orthotics

## 2019-07-18 ENCOUNTER — Other Ambulatory Visit: Payer: Self-pay | Admitting: Family Medicine

## 2019-07-19 ENCOUNTER — Other Ambulatory Visit: Payer: Self-pay

## 2019-07-19 ENCOUNTER — Encounter: Payer: Self-pay | Admitting: Podiatry

## 2019-07-19 ENCOUNTER — Ambulatory Visit (INDEPENDENT_AMBULATORY_CARE_PROVIDER_SITE_OTHER): Payer: 59 | Admitting: Podiatry

## 2019-07-19 VITALS — Temp 97.4°F

## 2019-07-19 DIAGNOSIS — L97511 Non-pressure chronic ulcer of other part of right foot limited to breakdown of skin: Secondary | ICD-10-CM

## 2019-07-19 DIAGNOSIS — M869 Osteomyelitis, unspecified: Secondary | ICD-10-CM | POA: Diagnosis not present

## 2019-07-19 DIAGNOSIS — Z9889 Other specified postprocedural states: Secondary | ICD-10-CM

## 2019-07-19 MED ORDER — OXYCODONE-ACETAMINOPHEN 5-325 MG PO TABS: 1.0000 | ORAL_TABLET | Freq: Three times a day (TID) | ORAL | 0 refills | Status: DC | PRN

## 2019-07-25 NOTE — Progress Notes (Signed)
Subjective: 54 year old female presents the office today for follow-up evaluation after undergoing right foot transmetatarsal amputation was performed on May 18, 2019.  She is doing well the incision is well-healed.  She has been measured for the insert with toe filler.  We are still awaiting this.  She has been in the cam boot but requested given as this has been worn out is falling apart.  No new lesions are noted.  She is telling sharp pains to her feet she is not seen pain management later that next week. Denies any systemic complaints such as fevers, chills, nausea, vomiting. No acute changes since last appointment, and no other complaints at this time.   Objective: AAO x3, NAD DP/PT pulses palpable bilaterally, CRT less than 3 seconds Right foot status post transmetatarsal amputation with a scar is formed.  There is no significant edema, erythema.  There is no drainage or pus or any signs of infection.  No warmth of the foot. No pain with calf compression, swelling, warmth, erythema   Assessment: Status post right foot transmetatarsal amputation which is healed however with phantom pain, neuropathy  Plan: -All treatment options discussed with the patient including all alternatives, risks, complications.  -Amputation site is well-healed.  Awaiting toe filler.  I did prescribe pain medicine today for her as well to take as breakthrough medication.  She is followed with pain management next week. -New cam boot was dispensed.   [name] DPM

## 2019-07-29 ENCOUNTER — Other Ambulatory Visit: Payer: Self-pay | Admitting: Family Medicine

## 2019-08-07 IMAGING — DX DG FOOT COMPLETE 3+V*L*
3 series · 3 of 3 positions shown · non-contrast
Comparison: 02/02/2018 and prior exams

CLINICAL DATA: Acute LEFT foot pain following injury yesterday.
Initial encounter.

EXAM:
LEFT FOOT - COMPLETE 3+ VIEW

[x foot ap left]
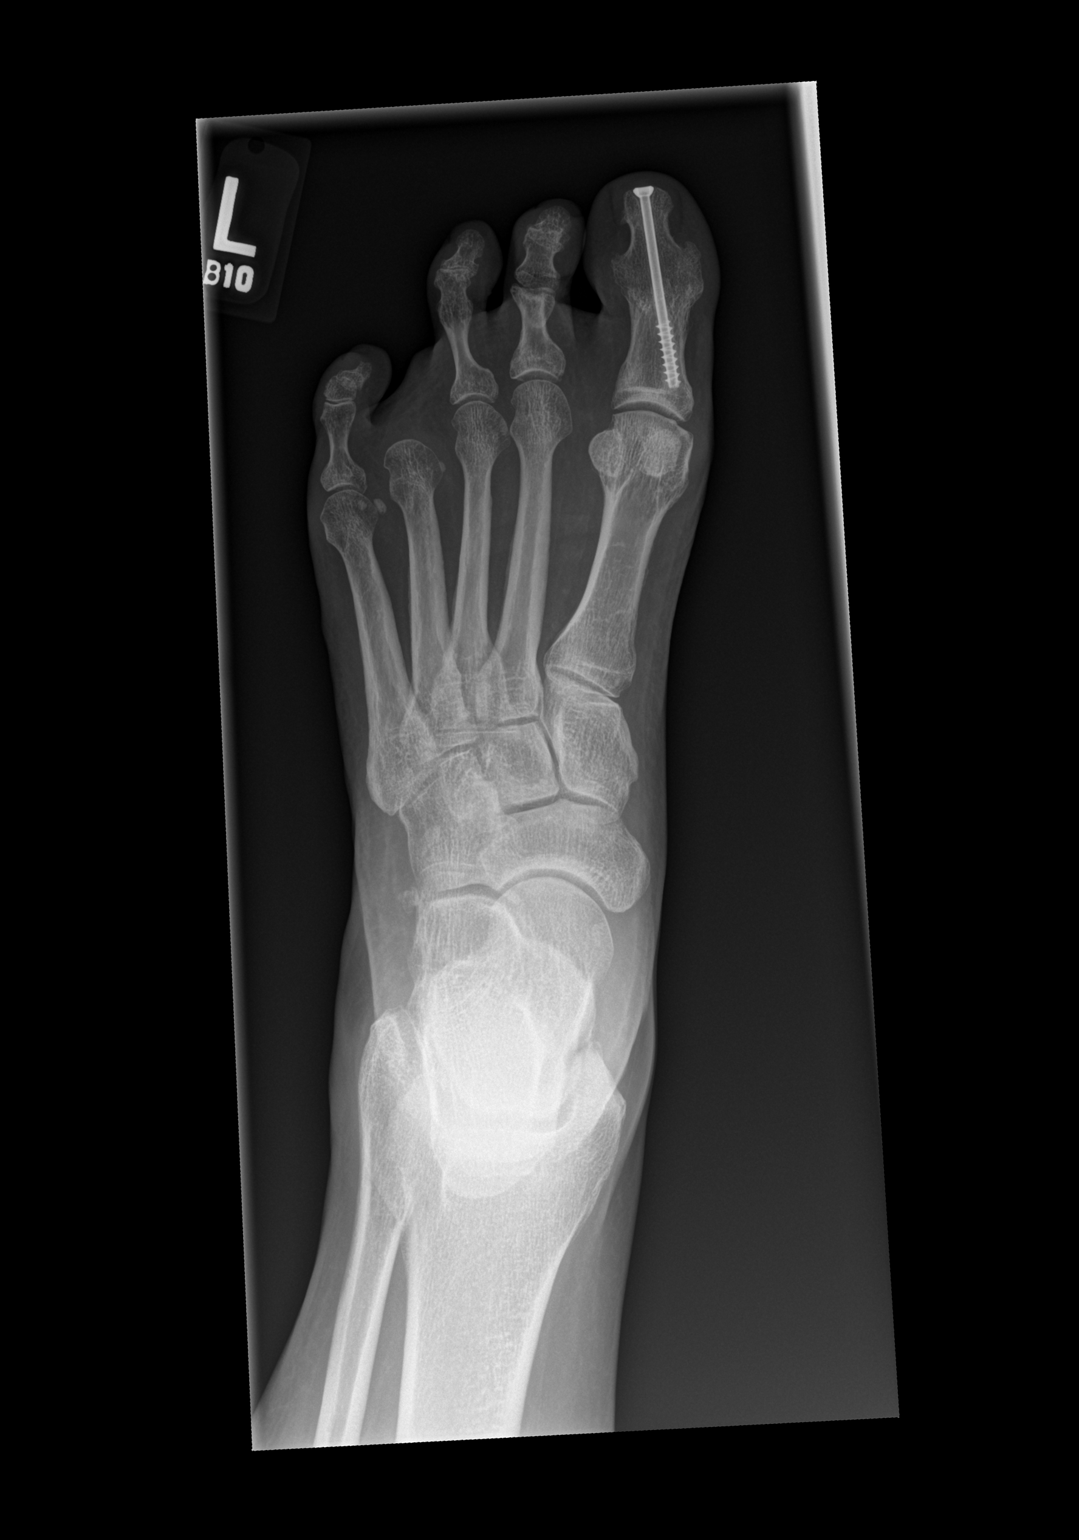

[x foot obl left]
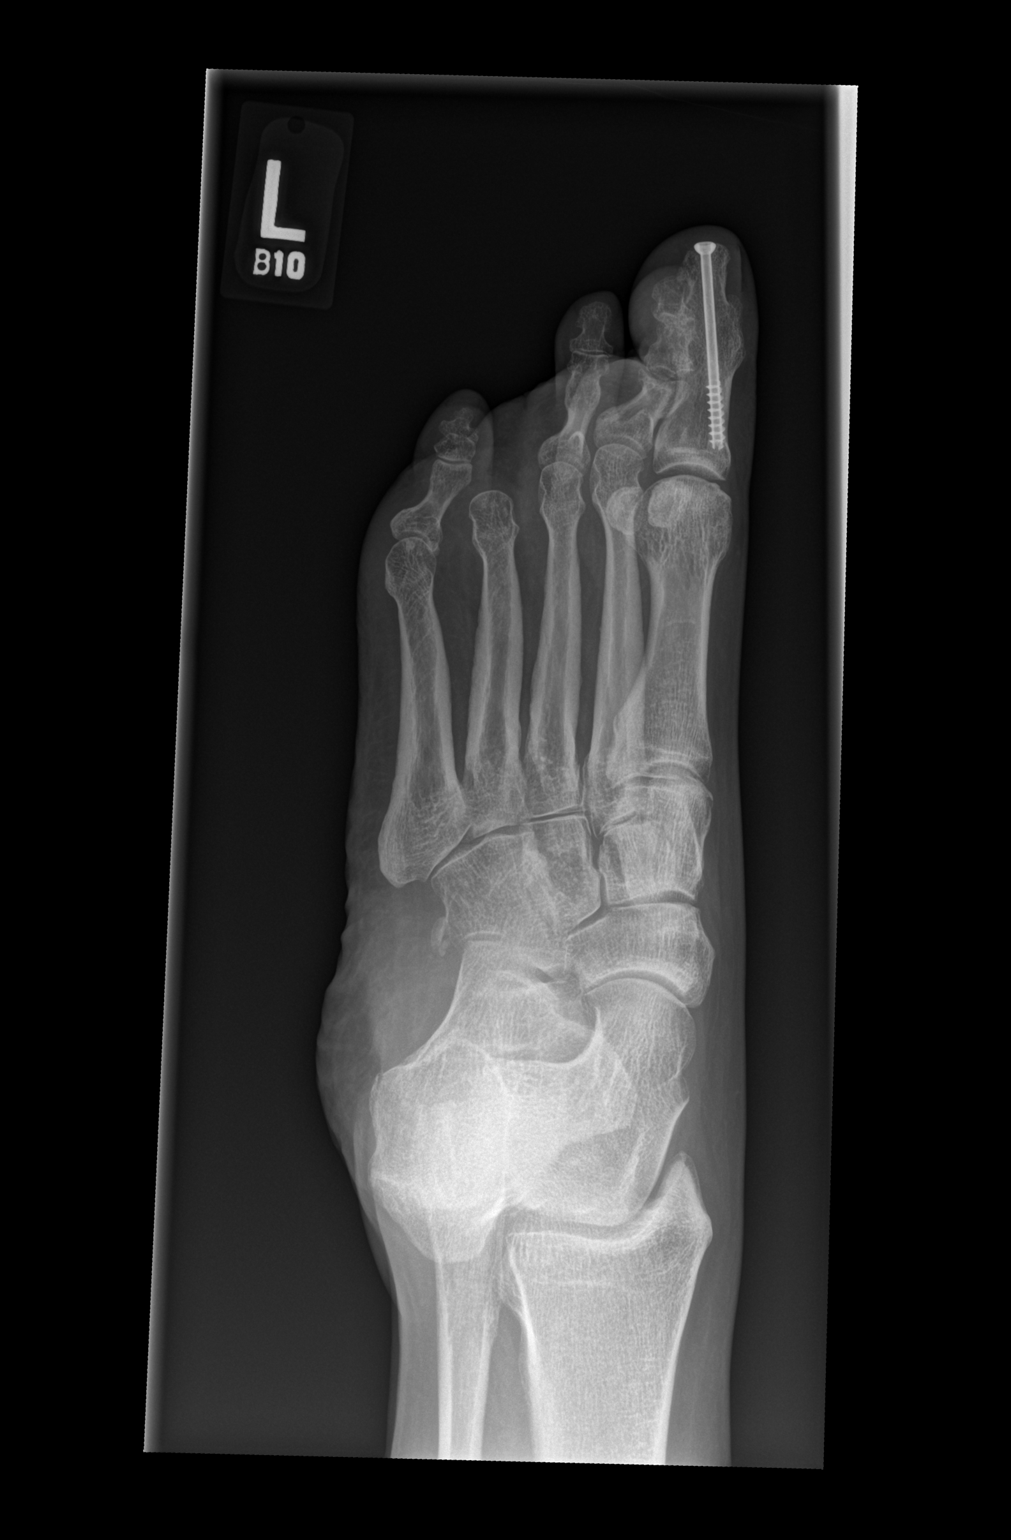

[x foot lat left]
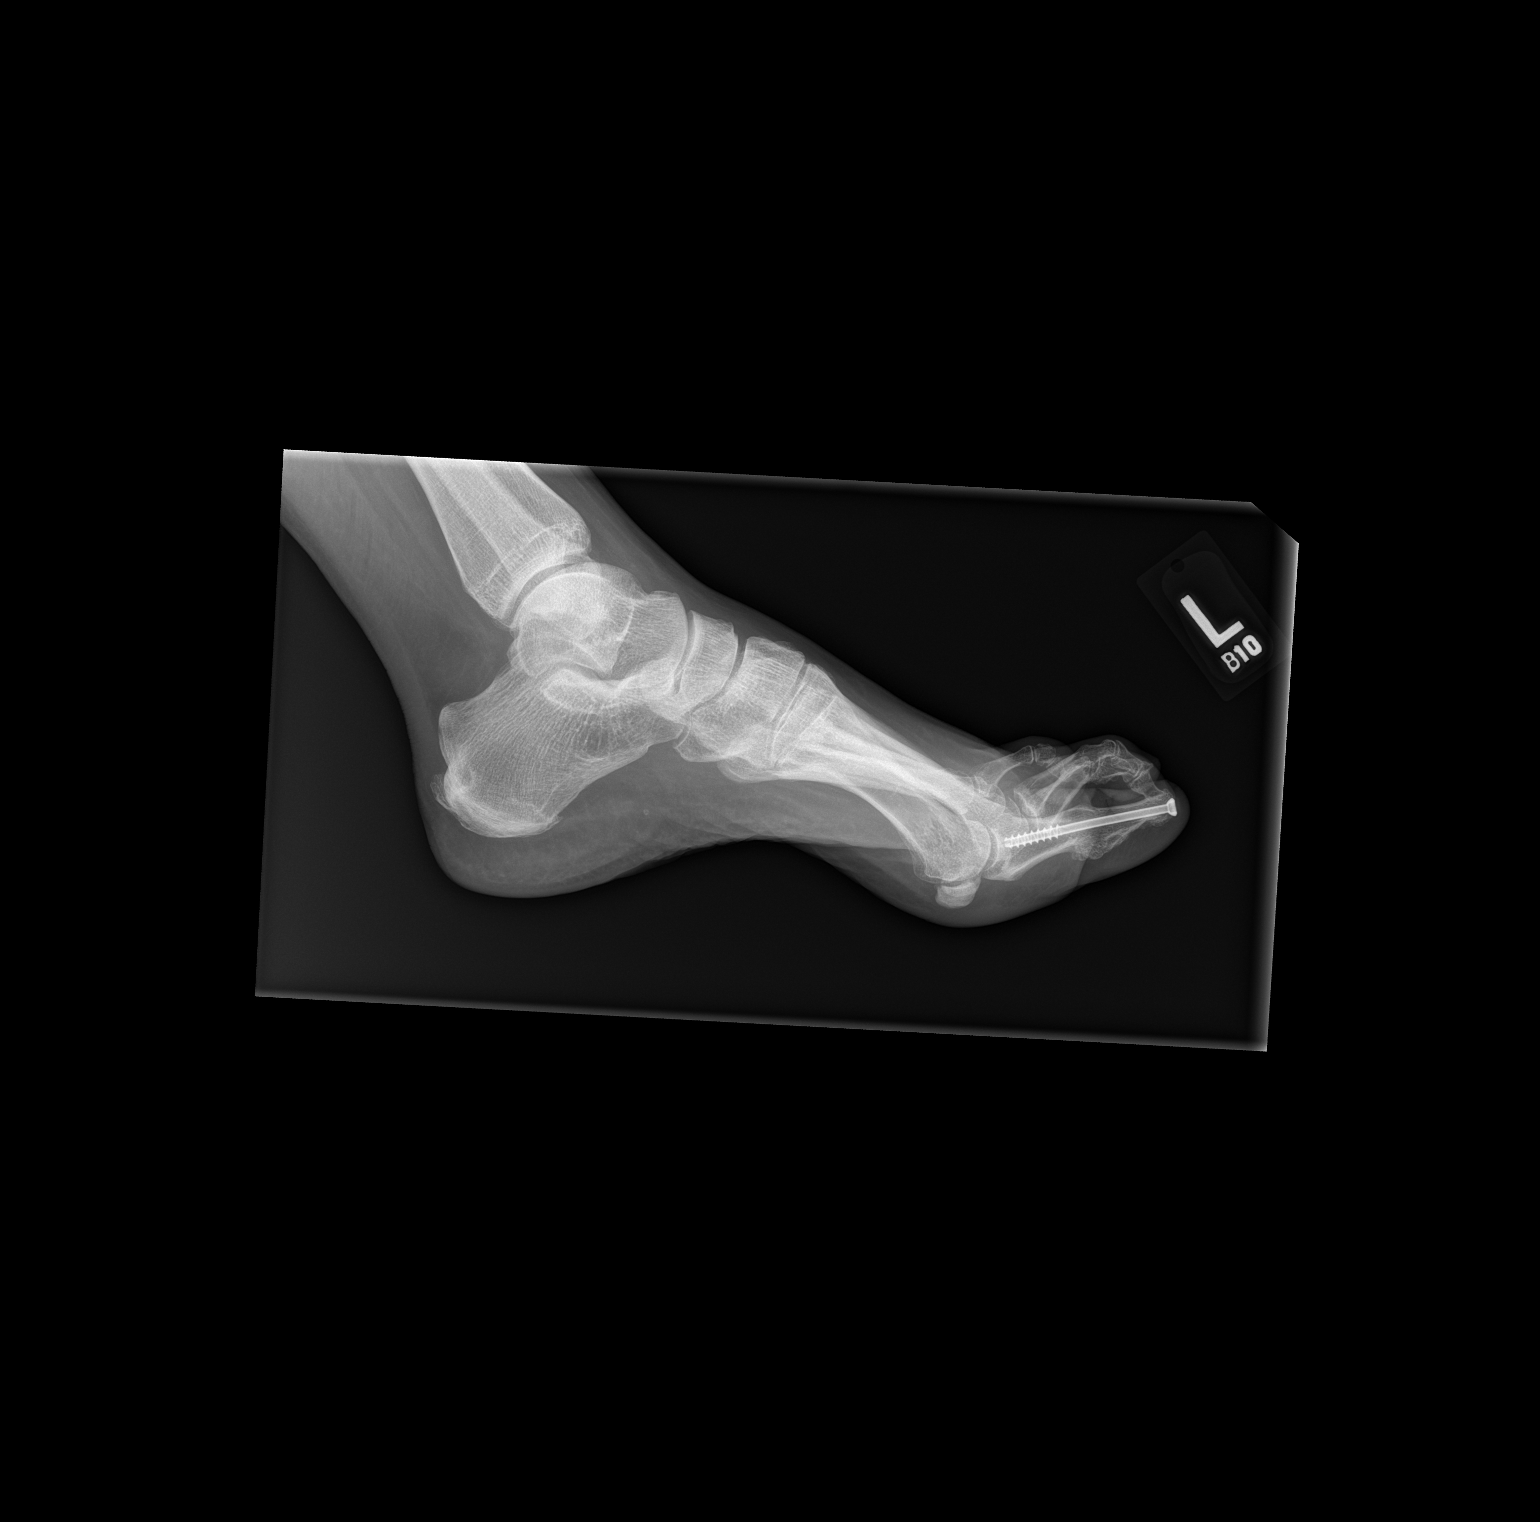

[3 of 3 positions shown; findings below may reference images not displayed]

FINDINGS: No acute fracture or dislocation.

Great toe screw/fusion again noted.

4th toe amputation again identified.

Soft tissues are unremarkable.
IMPRESSION: No acute bony abnormality.

## 2019-08-07 IMAGING — DX DG ANKLE COMPLETE 3+V*L*
3 series · 3 of 3 positions shown · non-contrast
Comparison: None.

CLINICAL DATA: Injury last night

EXAM:
LEFT ANKLE COMPLETE - 3+ VIEW

[x ankle ap left]
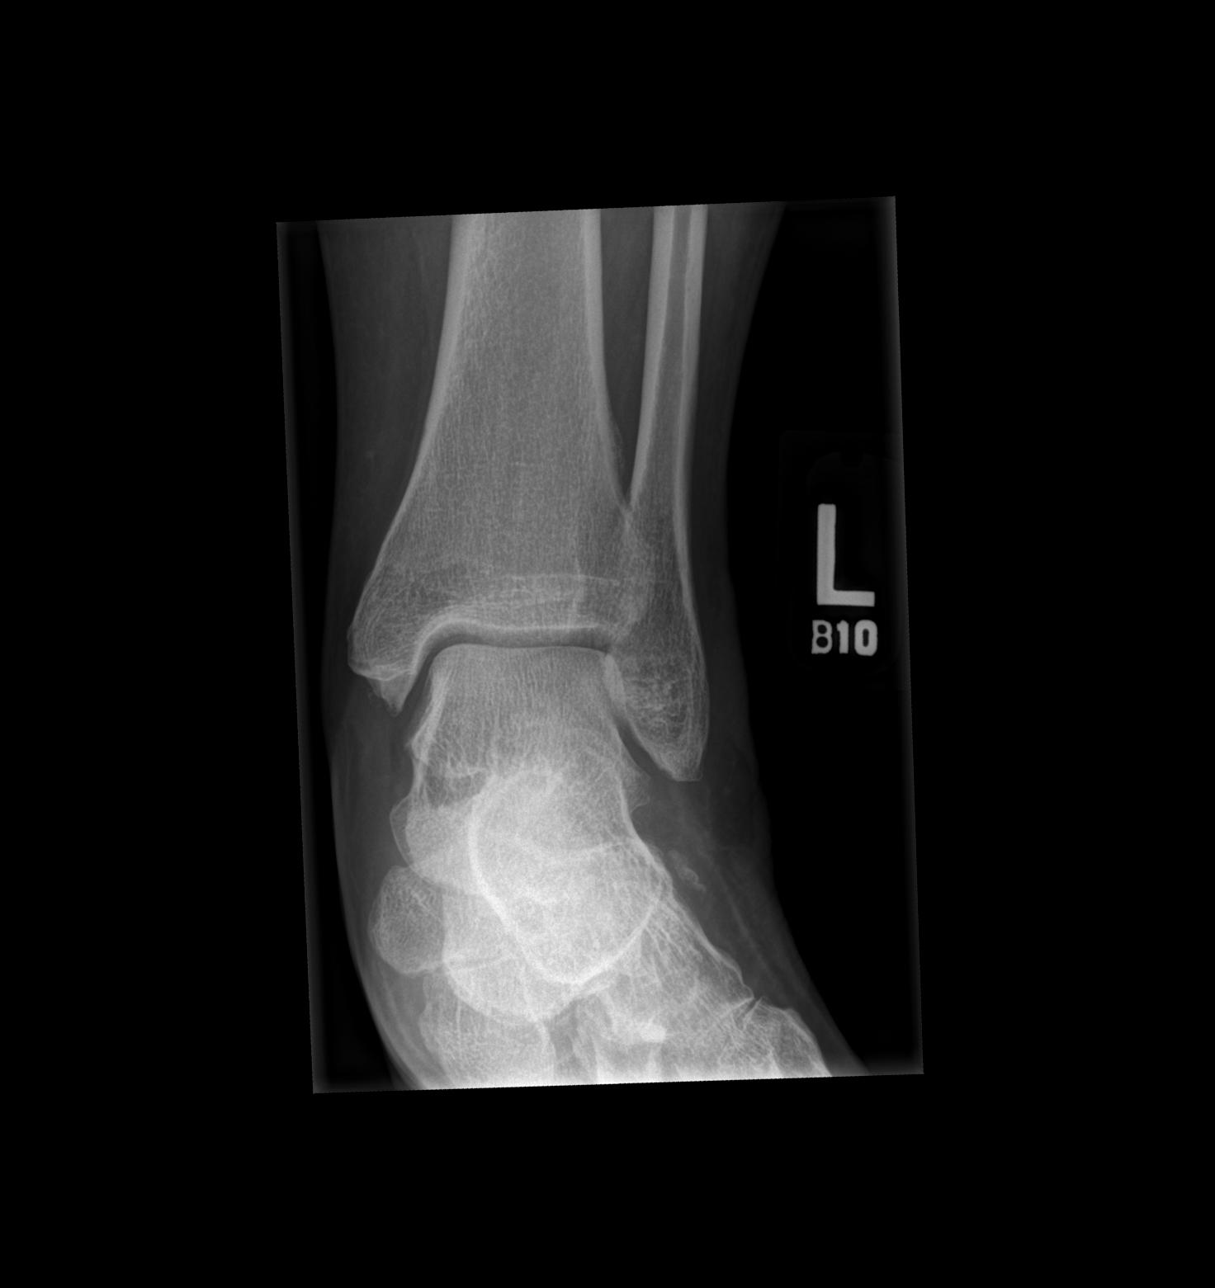

[x ankle obl left]
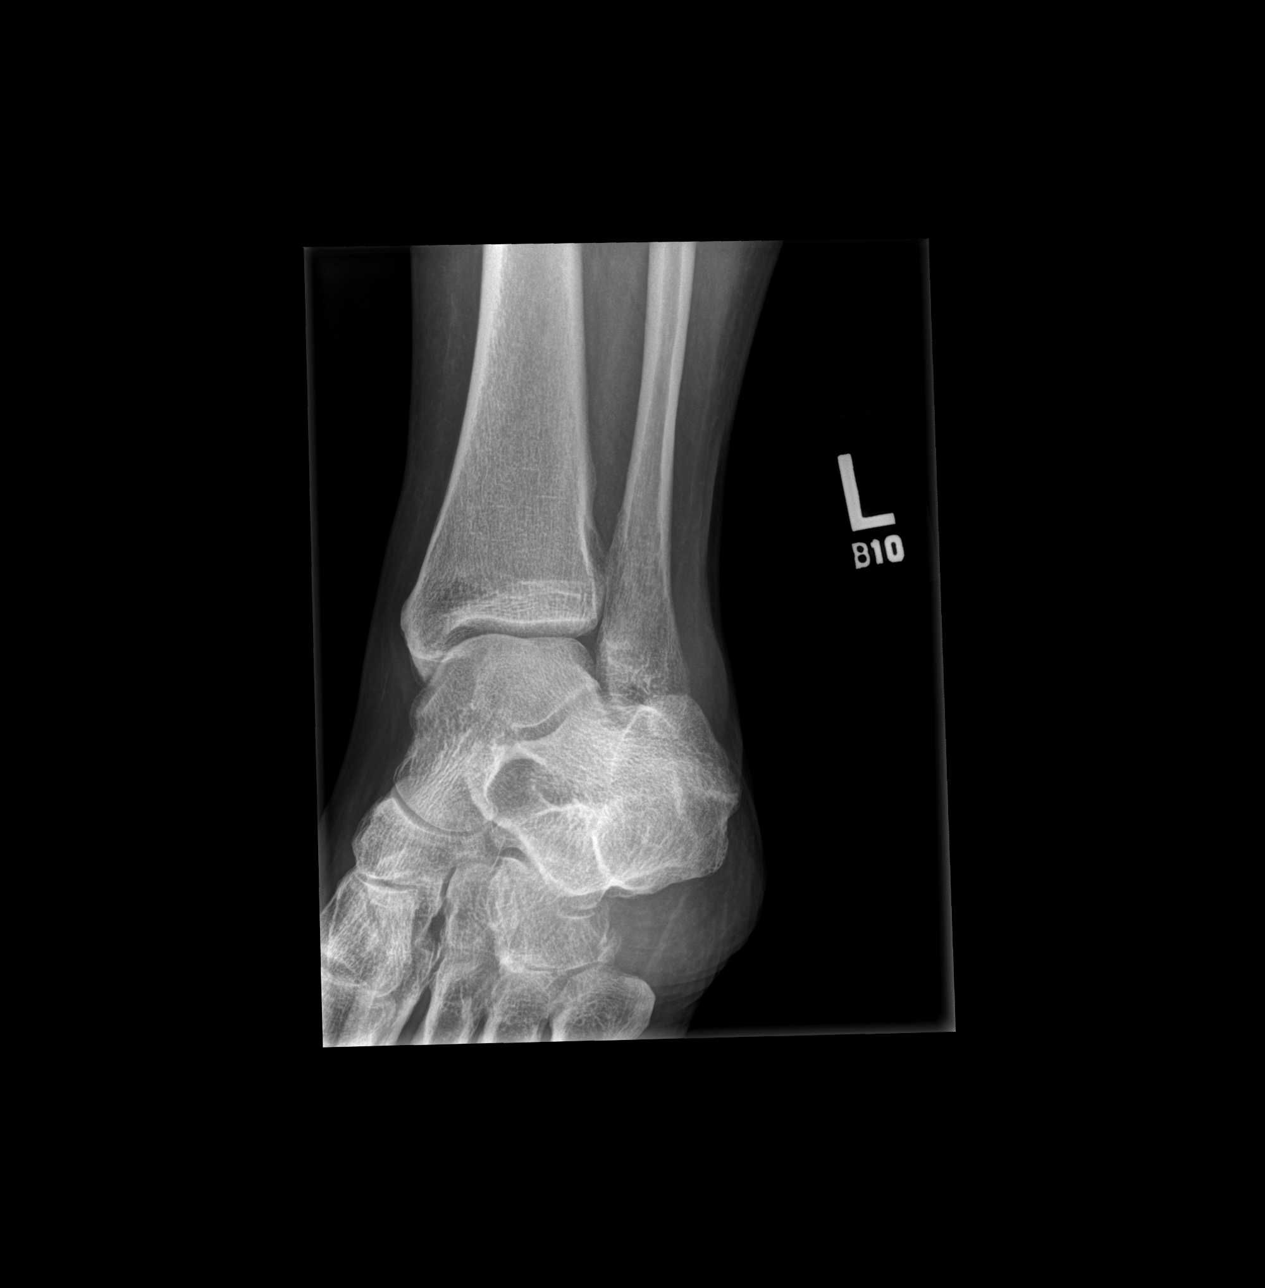

[x ankle lat left]
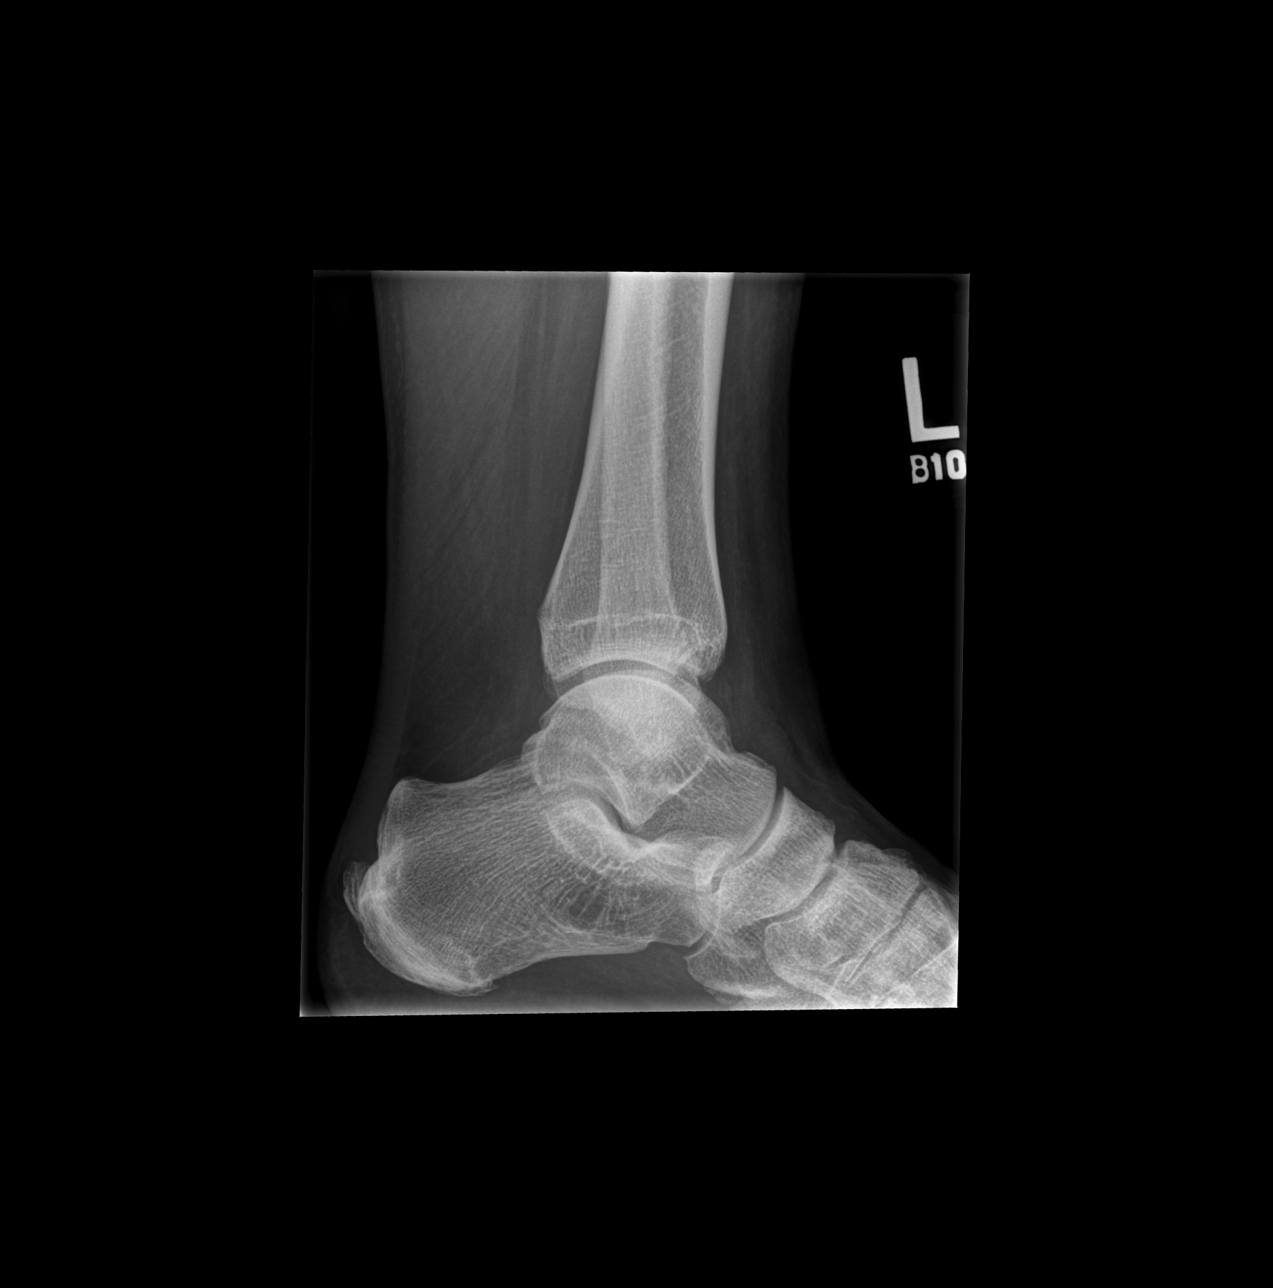

[3 of 3 positions shown; findings below may reference images not displayed]

FINDINGS: There is a small linear bony density lateral to the talus. The tibia
and fibula are intact. Mild soft tissue swelling over the lateral
ankle. Spurring at the posterior and inferior calcaneus.
IMPRESSION: Possible avulsion fracture from the talus of indeterminate age.

## 2019-08-16 ENCOUNTER — Ambulatory Visit: Payer: 59 | Admitting: Podiatry

## 2019-08-16 ENCOUNTER — Other Ambulatory Visit: Payer: Self-pay | Admitting: *Deleted

## 2019-08-16 MED ORDER — ESTRADIOL 1 MG PO TABS: 1.0000 mg | ORAL_TABLET | Freq: Every day | ORAL | 3 refills | Status: DC

## 2019-08-20 ENCOUNTER — Other Ambulatory Visit: Payer: Self-pay | Admitting: Cardiovascular Disease

## 2019-08-20 ENCOUNTER — Encounter: Payer: 59 | Admitting: Orthotics

## 2019-08-20 NOTE — Telephone Encounter (Signed)
*  STAT* If patient is at the pharmacy, call can be transferred to refill team.   1. Which medications need to be refilled? (please list name of each medication and dose if known)  metoprolol succinate (TOPROL-XL) 25 MG 24 hr tablet  2. Which pharmacy/location (including street and city if local pharmacy) is medication to be sent to?  [id number] - [place], [place] - [address]  3. Do they need a 30 day or 90 day supply? 90  Pt is out of medication

## 2019-08-25 ENCOUNTER — Other Ambulatory Visit: Payer: Self-pay

## 2019-08-25 ENCOUNTER — Emergency Department: Payer: 59

## 2019-08-25 DIAGNOSIS — M549 Dorsalgia, unspecified: Secondary | ICD-10-CM | POA: Diagnosis present

## 2019-08-25 DIAGNOSIS — R002 Palpitations: Secondary | ICD-10-CM | POA: Diagnosis present

## 2019-08-25 DIAGNOSIS — L089 Local infection of the skin and subcutaneous tissue, unspecified: Secondary | ICD-10-CM | POA: Diagnosis present

## 2019-08-25 DIAGNOSIS — F419 Anxiety disorder, unspecified: Secondary | ICD-10-CM | POA: Diagnosis present

## 2019-08-25 DIAGNOSIS — Z20822 Contact with and (suspected) exposure to covid-19: Secondary | ICD-10-CM | POA: Diagnosis present

## 2019-08-25 DIAGNOSIS — Z833 Family history of diabetes mellitus: Secondary | ICD-10-CM

## 2019-08-25 DIAGNOSIS — Z83438 Family history of other disorder of lipoprotein metabolism and other lipidemia: Secondary | ICD-10-CM

## 2019-08-25 DIAGNOSIS — Z825 Family history of asthma and other chronic lower respiratory diseases: Secondary | ICD-10-CM

## 2019-08-25 DIAGNOSIS — R7303 Prediabetes: Secondary | ICD-10-CM | POA: Diagnosis present

## 2019-08-25 DIAGNOSIS — Z8249 Family history of ischemic heart disease and other diseases of the circulatory system: Secondary | ICD-10-CM

## 2019-08-25 DIAGNOSIS — G8929 Other chronic pain: Secondary | ICD-10-CM | POA: Diagnosis present

## 2019-08-25 DIAGNOSIS — Z791 Long term (current) use of non-steroidal anti-inflammatories (NSAID): Secondary | ICD-10-CM

## 2019-08-25 DIAGNOSIS — Z79891 Long term (current) use of opiate analgesic: Secondary | ICD-10-CM

## 2019-08-25 DIAGNOSIS — Z981 Arthrodesis status: Secondary | ICD-10-CM

## 2019-08-25 DIAGNOSIS — G43909 Migraine, unspecified, not intractable, without status migrainosus: Secondary | ICD-10-CM | POA: Diagnosis present

## 2019-08-25 DIAGNOSIS — K219 Gastro-esophageal reflux disease without esophagitis: Secondary | ICD-10-CM | POA: Diagnosis present

## 2019-08-25 DIAGNOSIS — G4733 Obstructive sleep apnea (adult) (pediatric): Secondary | ICD-10-CM | POA: Diagnosis present

## 2019-08-25 DIAGNOSIS — F319 Bipolar disorder, unspecified: Secondary | ICD-10-CM | POA: Diagnosis present

## 2019-08-25 DIAGNOSIS — R569 Unspecified convulsions: Secondary | ICD-10-CM | POA: Diagnosis present

## 2019-08-25 DIAGNOSIS — I10 Essential (primary) hypertension: Secondary | ICD-10-CM | POA: Diagnosis present

## 2019-08-25 DIAGNOSIS — J45909 Unspecified asthma, uncomplicated: Secondary | ICD-10-CM | POA: Diagnosis present

## 2019-08-25 DIAGNOSIS — Z8051 Family history of malignant neoplasm of kidney: Secondary | ICD-10-CM

## 2019-08-25 DIAGNOSIS — R55 Syncope and collapse: Principal | ICD-10-CM | POA: Diagnosis present

## 2019-08-25 DIAGNOSIS — Z90710 Acquired absence of both cervix and uterus: Secondary | ICD-10-CM

## 2019-08-25 DIAGNOSIS — Z89422 Acquired absence of other left toe(s): Secondary | ICD-10-CM

## 2019-08-25 DIAGNOSIS — M1990 Unspecified osteoarthritis, unspecified site: Secondary | ICD-10-CM | POA: Diagnosis present

## 2019-08-25 LAB — BASIC METABOLIC PANEL
Anion gap: 8 (ref 5–15)
BUN: 9 mg/dL (ref 6–20)
CO2: 26 mmol/L (ref 22–32)
Calcium: 8.9 mg/dL (ref 8.9–10.3)
Chloride: 103 mmol/L (ref 98–111)
Creatinine, Ser: 0.56 mg/dL (ref 0.44–1.00)
GFR calc Af Amer: >60 mL/min (ref >60)
GFR calc non Af Amer: >60 mL/min (ref >60)
Glucose, Bld: 121 mg/dL — ABNORMAL HIGH (ref 70–99)
Potassium: 3.6 mmol/L (ref 3.5–5.1)
Sodium: 137 mmol/L (ref 135–145)

## 2019-08-25 LAB — CBC
HCT: 38.4 % (ref 36.0–46.0)
Hemoglobin: 12.6 g/dL (ref 12.0–15.0)
MCH: 31 pg (ref 26.0–34.0)
MCHC: 32.8 g/dL (ref 30.0–36.0)
MCV: 94.6 fL (ref 80.0–100.0)
Platelets: 329 10*3/uL (ref 150–400)
RBC: 4.06 MIL/uL (ref 3.87–5.11)
RDW: 14.3 % (ref 11.5–15.5)
WBC: 7.3 10*3/uL (ref 4.0–10.5)
nRBC: 0 % (ref 0.0–0.2)

## 2019-08-25 LAB — TROPONIN I (HIGH SENSITIVITY): Troponin I (High Sensitivity): <2 ng/L (ref <18)

## 2019-08-25 NOTE — ED Triage Notes (Signed)
Pt with syncopal episode at home. Pt complains of slight headache. Pt with 20g int to rac. Pt received 541mL ns bolus via ems. Pt states was sitting down when incident happened. Pt denies fever, shob.

## 2019-08-25 NOTE — ED Notes (Signed)
Patient given update regarding wait time.  Verbalized understanding. 

## 2019-08-25 NOTE — ED Notes (Signed)
Patient to waiting room via wheelchair by [facility].  EMS reports patient with syncopal episode.  EMS reports patient was diaphoretic on their arrival.  EMS interventions  12-lead showing Stach with peaked T waves, IV via 20 g angiocath to right antecub, received 400 ml of fluid and Zofran 4mg .  VS - temp 97.9 (tympanic), p 110, rr 18, BP 170/98, pulse oxi 96% on room air.

## 2019-08-26 ENCOUNTER — Inpatient Hospital Stay: Payer: 59

## 2019-08-26 ENCOUNTER — Emergency Department: Payer: 59

## 2019-08-26 ENCOUNTER — Observation Stay
Admit: 2019-08-26 | Discharge: 2019-08-26 | Disposition: A | Discharge status: Home / Self Care | Payer: 59 | Attending: Family Medicine | Admitting: Family Medicine

## 2019-08-26 ENCOUNTER — Inpatient Hospital Stay
Admission: EM | Admit: 2019-08-26 | Discharge: 2019-08-28 | DRG: 312 | Disposition: A | Discharge status: Home / Self Care | Payer: 59 | Attending: Internal Medicine | Admitting: Internal Medicine

## 2019-08-26 ENCOUNTER — Observation Stay: Payer: 59

## 2019-08-26 DIAGNOSIS — I10 Essential (primary) hypertension: Secondary | ICD-10-CM | POA: Diagnosis present

## 2019-08-26 DIAGNOSIS — G43009 Migraine without aura, not intractable, without status migrainosus: Secondary | ICD-10-CM | POA: Diagnosis not present

## 2019-08-26 DIAGNOSIS — Z90710 Acquired absence of both cervix and uterus: Secondary | ICD-10-CM | POA: Diagnosis not present

## 2019-08-26 DIAGNOSIS — M549 Dorsalgia, unspecified: Secondary | ICD-10-CM | POA: Diagnosis present

## 2019-08-26 DIAGNOSIS — J45909 Unspecified asthma, uncomplicated: Secondary | ICD-10-CM | POA: Diagnosis present

## 2019-08-26 DIAGNOSIS — M1990 Unspecified osteoarthritis, unspecified site: Secondary | ICD-10-CM | POA: Diagnosis present

## 2019-08-26 DIAGNOSIS — L089 Local infection of the skin and subcutaneous tissue, unspecified: Secondary | ICD-10-CM

## 2019-08-26 DIAGNOSIS — F419 Anxiety disorder, unspecified: Secondary | ICD-10-CM | POA: Diagnosis present

## 2019-08-26 DIAGNOSIS — Z8249 Family history of ischemic heart disease and other diseases of the circulatory system: Secondary | ICD-10-CM | POA: Diagnosis not present

## 2019-08-26 DIAGNOSIS — R55 Syncope and collapse: Secondary | ICD-10-CM | POA: Diagnosis present

## 2019-08-26 DIAGNOSIS — G8929 Other chronic pain: Secondary | ICD-10-CM

## 2019-08-26 DIAGNOSIS — R002 Palpitations: Secondary | ICD-10-CM | POA: Diagnosis present

## 2019-08-26 DIAGNOSIS — R569 Unspecified convulsions: Secondary | ICD-10-CM | POA: Diagnosis present

## 2019-08-26 DIAGNOSIS — L97529 Non-pressure chronic ulcer of other part of left foot with unspecified severity: Secondary | ICD-10-CM

## 2019-08-26 DIAGNOSIS — Z8051 Family history of malignant neoplasm of kidney: Secondary | ICD-10-CM | POA: Diagnosis not present

## 2019-08-26 DIAGNOSIS — F319 Bipolar disorder, unspecified: Secondary | ICD-10-CM | POA: Diagnosis present

## 2019-08-26 DIAGNOSIS — Z825 Family history of asthma and other chronic lower respiratory diseases: Secondary | ICD-10-CM | POA: Diagnosis not present

## 2019-08-26 DIAGNOSIS — F329 Major depressive disorder, single episode, unspecified: Secondary | ICD-10-CM | POA: Diagnosis not present

## 2019-08-26 DIAGNOSIS — Z20822 Contact with and (suspected) exposure to covid-19: Secondary | ICD-10-CM | POA: Diagnosis present

## 2019-08-26 DIAGNOSIS — Z83438 Family history of other disorder of lipoprotein metabolism and other lipidemia: Secondary | ICD-10-CM | POA: Diagnosis not present

## 2019-08-26 DIAGNOSIS — G4733 Obstructive sleep apnea (adult) (pediatric): Secondary | ICD-10-CM | POA: Diagnosis present

## 2019-08-26 DIAGNOSIS — M545 Low back pain: Secondary | ICD-10-CM | POA: Diagnosis not present

## 2019-08-26 DIAGNOSIS — Z833 Family history of diabetes mellitus: Secondary | ICD-10-CM | POA: Diagnosis not present

## 2019-08-26 DIAGNOSIS — K219 Gastro-esophageal reflux disease without esophagitis: Secondary | ICD-10-CM | POA: Diagnosis present

## 2019-08-26 DIAGNOSIS — R7303 Prediabetes: Secondary | ICD-10-CM | POA: Diagnosis present

## 2019-08-26 DIAGNOSIS — G43909 Migraine, unspecified, not intractable, without status migrainosus: Secondary | ICD-10-CM | POA: Diagnosis present

## 2019-08-26 DIAGNOSIS — B9562 Methicillin resistant Staphylococcus aureus infection as the cause of diseases classified elsewhere: Secondary | ICD-10-CM

## 2019-08-26 DIAGNOSIS — Z89422 Acquired absence of other left toe(s): Secondary | ICD-10-CM | POA: Diagnosis not present

## 2019-08-26 DIAGNOSIS — Z79891 Long term (current) use of opiate analgesic: Secondary | ICD-10-CM | POA: Diagnosis not present

## 2019-08-26 HISTORY — DX: Syncope and collapse: R55

## 2019-08-26 LAB — URINE DRUG SCREEN, QUALITATIVE (ARMC ONLY)
Amphetamines, Ur Screen: NOT DETECTED
Barbiturates, Ur Screen: NOT DETECTED
Benzodiazepine, Ur Scrn: NOT DETECTED
Cannabinoid 50 Ng, Ur ~~LOC~~: NOT DETECTED
Cocaine Metabolite,Ur ~~LOC~~: NOT DETECTED
MDMA (Ecstasy)Ur Screen: NOT DETECTED
Methadone Scn, Ur: NOT DETECTED
Opiate, Ur Screen: POSITIVE — AB
Phencyclidine (PCP) Ur S: NOT DETECTED
Tricyclic, Ur Screen: POSITIVE — AB

## 2019-08-26 LAB — BASIC METABOLIC PANEL
Anion gap: 8 (ref 5–15)
BUN: 8 mg/dL (ref 6–20)
CO2: 25 mmol/L (ref 22–32)
Calcium: 8.5 mg/dL — ABNORMAL LOW (ref 8.9–10.3)
Chloride: 106 mmol/L (ref 98–111)
Creatinine, Ser: 0.61 mg/dL (ref 0.44–1.00)
GFR calc Af Amer: >60 mL/min (ref >60)
GFR calc non Af Amer: >60 mL/min (ref >60)
Glucose, Bld: 97 mg/dL (ref 70–99)
Potassium: 3.8 mmol/L (ref 3.5–5.1)
Sodium: 139 mmol/L (ref 135–145)

## 2019-08-26 LAB — TROPONIN I (HIGH SENSITIVITY): Troponin I (High Sensitivity): <2 ng/L (ref <18)

## 2019-08-26 LAB — URINALYSIS, COMPLETE (UACMP) WITH MICROSCOPIC
Bilirubin Urine: NEGATIVE
Glucose, UA: NEGATIVE mg/dL
Hgb urine dipstick: NEGATIVE
Ketones, ur: NEGATIVE mg/dL
Leukocytes,Ua: NEGATIVE
Nitrite: NEGATIVE
Protein, ur: NEGATIVE mg/dL
Specific Gravity, Urine: 1.013 (ref 1.005–1.030)
pH: 7 (ref 5.0–8.0)

## 2019-08-26 LAB — CBC
HCT: 35.4 % — ABNORMAL LOW (ref 36.0–46.0)
Hemoglobin: 11.6 g/dL — ABNORMAL LOW (ref 12.0–15.0)
MCH: 31.1 pg (ref 26.0–34.0)
MCHC: 32.8 g/dL (ref 30.0–36.0)
MCV: 94.9 fL (ref 80.0–100.0)
Platelets: 294 10*3/uL (ref 150–400)
RBC: 3.73 MIL/uL — ABNORMAL LOW (ref 3.87–5.11)
RDW: 14.3 % (ref 11.5–15.5)
WBC: 8.3 10*3/uL (ref 4.0–10.5)
nRBC: 0 % (ref 0.0–0.2)

## 2019-08-26 LAB — HEMOGLOBIN A1C
Hgb A1c MFr Bld: 5.6 % (ref 4.8–5.6)
Mean Plasma Glucose: 114.02 mg/dL

## 2019-08-26 LAB — RESPIRATORY PANEL BY RT PCR (FLU A&B, COVID)
Influenza A by PCR: NEGATIVE
Influenza B by PCR: NEGATIVE
SARS Coronavirus 2 by RT PCR: NEGATIVE

## 2019-08-26 LAB — ECHOCARDIOGRAM COMPLETE

## 2019-08-26 LAB — GLUCOSE, CAPILLARY: Glucose-Capillary: 87 mg/dL (ref 70–99)

## 2019-08-26 MED ORDER — VANCOMYCIN HCL 1750 MG/350ML IV SOLN: 1750.0000 mg | INTRAVENOUS | Status: DC

## 2019-08-26 MED ORDER — LINACLOTIDE 290 MCG PO CAPS
290.0000 ug | ORAL_CAPSULE | Freq: Every day | ORAL | Status: DC
  Administered 2019-08-26 – 2019-08-28 (×3): 290 ug via ORAL
  Filled 2019-08-26 (×3): qty 1

## 2019-08-26 MED ORDER — CEPHALEXIN 500 MG PO CAPS
500.0000 mg | ORAL_CAPSULE | Freq: Two times a day (BID) | ORAL | Status: DC
  Administered 2019-08-27: 500 mg via ORAL
  Filled 2019-08-26: qty 1

## 2019-08-26 MED ORDER — ACETAMINOPHEN 650 MG RE SUPP: 650.0000 mg | Freq: Four times a day (QID) | RECTAL | Status: DC | PRN

## 2019-08-26 MED ORDER — ENOXAPARIN SODIUM 40 MG/0.4ML ~~LOC~~ SOLN
40.0000 mg | Freq: Two times a day (BID) | SUBCUTANEOUS | Status: DC
  Administered 2019-08-26 – 2019-08-28 (×3): 40 mg via SUBCUTANEOUS
  Filled 2019-08-26 (×4): qty 0.4

## 2019-08-26 MED ORDER — HALOPERIDOL 5 MG PO TABS
5.0000 mg | ORAL_TABLET | Freq: Every day | ORAL | Status: DC
  Administered 2019-08-26 – 2019-08-27 (×2): 5 mg via ORAL
  Filled 2019-08-26 (×3): qty 1

## 2019-08-26 MED ORDER — BUSPIRONE HCL 15 MG PO TABS
15.0000 mg | ORAL_TABLET | Freq: Three times a day (TID) | ORAL | Status: DC
  Administered 2019-08-26 – 2019-08-28 (×7): 15 mg via ORAL
  Filled 2019-08-26 (×4): qty 1
  Filled 2019-08-26: qty 3
  Filled 2019-08-26 (×4): qty 1

## 2019-08-26 MED ORDER — MAGNESIUM HYDROXIDE 400 MG/5ML PO SUSP: 30.0000 mL | Freq: Every day | ORAL | Status: DC | PRN

## 2019-08-26 MED ORDER — LIFITEGRAST 5 % OP SOLN: 1.0000 [drp] | Freq: Two times a day (BID) | OPHTHALMIC | Status: DC

## 2019-08-26 MED ORDER — DULOXETINE HCL 30 MG PO CPEP
60.0000 mg | ORAL_CAPSULE | Freq: Every day | ORAL | Status: DC
  Administered 2019-08-26 – 2019-08-27 (×2): 60 mg via ORAL
  Filled 2019-08-26 (×2): qty 2

## 2019-08-26 MED ORDER — VANCOMYCIN HCL 2000 MG/400ML IV SOLN
2000.0000 mg | Freq: Once | INTRAVENOUS | Status: AC
  Administered 2019-08-26: 2000 mg via INTRAVENOUS
  Filled 2019-08-26 (×2): qty 400

## 2019-08-26 MED ORDER — POTASSIUM CHLORIDE CRYS ER 10 MEQ PO TBCR
10.0000 meq | EXTENDED_RELEASE_TABLET | Freq: Every day | ORAL | Status: DC
  Administered 2019-08-26 – 2019-08-28 (×3): 10 meq via ORAL
  Filled 2019-08-26 (×3): qty 1

## 2019-08-26 MED ORDER — SUMATRIPTAN SUCCINATE 50 MG PO TABS
50.0000 mg | ORAL_TABLET | ORAL | Status: DC | PRN
  Administered 2019-08-26 – 2019-08-28 (×5): 50 mg via ORAL
  Filled 2019-08-26 (×7): qty 1

## 2019-08-26 MED ORDER — FLUTICASONE PROPIONATE 50 MCG/ACT NA SUSP
2.0000 | Freq: Every day | NASAL | Status: DC | PRN
  Filled 2019-08-26: qty 16

## 2019-08-26 MED ORDER — GABAPENTIN 600 MG PO TABS
600.0000 mg | ORAL_TABLET | Freq: Four times a day (QID) | ORAL | Status: DC
  Administered 2019-08-26 – 2019-08-28 (×10): 600 mg via ORAL
  Filled 2019-08-26 (×10): qty 1

## 2019-08-26 MED ORDER — METOPROLOL SUCCINATE ER 25 MG PO TB24
25.0000 mg | ORAL_TABLET | Freq: Every day | ORAL | Status: DC
  Administered 2019-08-26 – 2019-08-28 (×3): 25 mg via ORAL
  Filled 2019-08-26 (×4): qty 1

## 2019-08-26 MED ORDER — ADULT MULTIVITAMIN W/MINERALS CH
1.0000 | ORAL_TABLET | Freq: Every day | ORAL | Status: DC
  Administered 2019-08-26 – 2019-08-28 (×3): 1 via ORAL
  Filled 2019-08-26 (×3): qty 1

## 2019-08-26 MED ORDER — SODIUM CHLORIDE 0.9 % IV SOLN
2.0000 g | INTRAVENOUS | Status: DC
  Administered 2019-08-26: 2 g via INTRAVENOUS
  Filled 2019-08-26: qty 2

## 2019-08-26 MED ORDER — CALCIUM 1200 1200-1000 MG-UNIT PO CHEW: CHEWABLE_TABLET | Freq: Every day | ORAL | Status: DC

## 2019-08-26 MED ORDER — OXYCODONE-ACETAMINOPHEN 5-325 MG PO TABS: 1.0000 | ORAL_TABLET | Freq: Three times a day (TID) | ORAL | Status: DC | PRN

## 2019-08-26 MED ORDER — LORATADINE 10 MG PO TABS
10.0000 mg | ORAL_TABLET | Freq: Every evening | ORAL | Status: DC
  Administered 2019-08-26 – 2019-08-27 (×2): 10 mg via ORAL
  Filled 2019-08-26 (×2): qty 1

## 2019-08-26 MED ORDER — MORPHINE SULFATE ER 15 MG PO TBCR
30.0000 mg | EXTENDED_RELEASE_TABLET | Freq: Three times a day (TID) | ORAL | Status: DC
  Administered 2019-08-26 – 2019-08-28 (×8): 30 mg via ORAL
  Filled 2019-08-26: qty 2
  Filled 2019-08-26 (×2): qty 1
  Filled 2019-08-26 (×5): qty 2
  Filled 2019-08-26: qty 1

## 2019-08-26 MED ORDER — MELOXICAM 7.5 MG PO TABS
15.0000 mg | ORAL_TABLET | Freq: Every day | ORAL | Status: DC
  Administered 2019-08-27: 15 mg via ORAL
  Filled 2019-08-26: qty 2

## 2019-08-26 MED ORDER — SODIUM CHLORIDE 0.9 % IV SOLN: INTRAVENOUS | Status: DC

## 2019-08-26 MED ORDER — TRAZODONE HCL 50 MG PO TABS: 25.0000 mg | ORAL_TABLET | Freq: Every evening | ORAL | Status: DC | PRN

## 2019-08-26 MED ORDER — VANCOMYCIN HCL 500 MG/100ML IV SOLN
500.0000 mg | Freq: Once | INTRAVENOUS | Status: DC
  Filled 2019-08-26 (×2): qty 100

## 2019-08-26 MED ORDER — ONDANSETRON HCL 4 MG PO TABS: 4.0000 mg | ORAL_TABLET | Freq: Four times a day (QID) | ORAL | Status: DC | PRN

## 2019-08-26 MED ORDER — NALOXONE HCL 4 MG/0.1ML NA LIQD: 1.0000 | Freq: Once | NASAL | Status: DC

## 2019-08-26 MED ORDER — ALBUTEROL SULFATE (2.5 MG/3ML) 0.083% IN NEBU: 2.5000 mg | INHALATION_SOLUTION | Freq: Four times a day (QID) | RESPIRATORY_TRACT | Status: DC | PRN

## 2019-08-26 MED ORDER — ESCITALOPRAM OXALATE 10 MG PO TABS
10.0000 mg | ORAL_TABLET | Freq: Every day | ORAL | Status: DC
  Administered 2019-08-26 – 2019-08-28 (×3): 10 mg via ORAL
  Filled 2019-08-26 (×3): qty 1

## 2019-08-26 MED ORDER — TRAZODONE HCL 100 MG PO TABS
100.0000 mg | ORAL_TABLET | Freq: Every day | ORAL | Status: DC
  Administered 2019-08-26 – 2019-08-27 (×2): 100 mg via ORAL
  Filled 2019-08-26 (×2): qty 1

## 2019-08-26 MED ORDER — CALCIUM CARBONATE-VITAMIN D 500-200 MG-UNIT PO TABS
1.0000 | ORAL_TABLET | Freq: Every day | ORAL | Status: DC
  Administered 2019-08-26 – 2019-08-28 (×3): 1 via ORAL
  Filled 2019-08-26 (×3): qty 1

## 2019-08-26 MED ORDER — TOPIRAMATE 25 MG PO TABS
50.0000 mg | ORAL_TABLET | Freq: Two times a day (BID) | ORAL | Status: DC
  Administered 2019-08-26 – 2019-08-28 (×6): 50 mg via ORAL
  Filled 2019-08-26 (×7): qty 2

## 2019-08-26 MED ORDER — ESTRADIOL 1 MG PO TABS
1.0000 mg | ORAL_TABLET | Freq: Every day | ORAL | Status: DC
  Administered 2019-08-26 – 2019-08-27 (×2): 1 mg via ORAL
  Filled 2019-08-26 (×3): qty 1

## 2019-08-26 MED ORDER — SODIUM CHLORIDE 0.9% FLUSH
3.0000 mL | Freq: Two times a day (BID) | INTRAVENOUS | Status: DC
  Administered 2019-08-26 – 2019-08-28 (×5): 3 mL via INTRAVENOUS

## 2019-08-26 MED ORDER — ONDANSETRON HCL 4 MG/2ML IJ SOLN
4.0000 mg | Freq: Four times a day (QID) | INTRAMUSCULAR | Status: DC | PRN
  Administered 2019-08-26 (×2): 4 mg via INTRAVENOUS
  Filled 2019-08-26: qty 2

## 2019-08-26 MED ORDER — ALUM & MAG HYDROXIDE-SIMETH 200-200-20 MG/5ML PO SUSP: 30.0000 mL | Freq: Four times a day (QID) | ORAL | Status: DC | PRN

## 2019-08-26 MED ORDER — CYCLOBENZAPRINE HCL 10 MG PO TABS
10.0000 mg | ORAL_TABLET | Freq: Two times a day (BID) | ORAL | Status: DC | PRN
  Administered 2019-08-26 – 2019-08-27 (×2): 10 mg via ORAL
  Filled 2019-08-26 (×2): qty 1

## 2019-08-26 MED ORDER — ACETAMINOPHEN 325 MG PO TABS
650.0000 mg | ORAL_TABLET | Freq: Four times a day (QID) | ORAL | Status: DC | PRN
  Administered 2019-08-26: 650 mg via ORAL
  Filled 2019-08-26: qty 2
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
## 2019-08-26 NOTE — ED Notes (Signed)
Lunch meal given. Pt refused due to nausea. Administered Zofran IV

## 2019-08-26 NOTE — ED Notes (Signed)
Awaiting med verification 

## 2019-08-26 NOTE — ED Notes (Signed)
Pt provided with OJ per cbg
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
## 2019-08-26 NOTE — H&P (Addendum)
[facility] NAME: [name] [name]    MR#:  [id number]  DATE OF BIRTH:  Nov 09, 1965  DATE OF ADMISSION:  08/26/2019  PRIMARY CARE PHYSICIAN: [name], MD   REQUESTING/REFERRING PHYSICIAN: Derrell Lolling, MD  CHIEF COMPLAINT:   Chief Complaint  Patient presents with  . Loss of Consciousness    HISTORY OF PRESENT ILLNESS:  [name] [name]  is a 54 y.o. Caucasian female with a known history of asthma, bipolar disorder, hypertension and chronic back pain, obstructive sleep apnea on home CPAP, who presented to the emergency room with acute onset of syncope preceded by palpitations without chest pains.  She had subsequent left hand swelling.  She admitted to mild dyspnea but denied any cough or wheezing.  No fever or chills.  No nausea or vomiting or abdominal pain.  No dysuria, oliguria or hematuria or flank pain.  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  Upon presentation to the emergency room, blood pressure was 141/81 with otherwise normal vital signs.  Labs revealed unremarkable BMP and CBC.  Urinalysis came back negative and urine drug screen came back positive for opiates and tricyclics. 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  Two-view chest x-ray showed no acute cardiopulmonary disease.  The patient will be admitted to an observation progressive cardiac unit for further evaluation and management.    PAST MEDICAL HISTORY:   Past Medical History:  Diagnosis Date  . Anxiety   . Asthma    mild intermittent  . Bipolar 1 disorder ([place])    ect treatments last treatment Sep 02 1011  . Depression   . GERD (gastroesophageal reflux disease)   . Hypertension   . Pre-diabetes   . Seizures ([place])    last seizure was so long ago she can't remember.   . Sleep apnea    wears CPAP,  uncertain of setting    PAST SURGICAL HISTORY:   Past Surgical History:  Procedure Laterality Date  . ABDOMINAL HYSTERECTOMY    . AMPUTATION TOE Left 02/02/2018   Procedure: AMPUTATION TOE Left 4th toe;  Surgeon: [name], DPM;  Location: [facility];  Service: Podiatry;  Laterality: Left;  . BACK SURGERY    . CARPAL TUNNEL RELEASE     x2  . Laproscopic knee surgery    . NECK SURGERY  03/15/2017  . PLANTAR FASCIA RELEASE     x2  . 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 Smoking status: Never Smoker  . Smokeless tobacco: Never Used  Substance Use Topics  . Alcohol use: No    FAMILY HISTORY:   Family History  Problem Relation Age of Onset  . Diabetes Mother   . COPD Mother   . Hypertension Mother   . Hyperlipidemia Mother   . Heart disease Father   . Hyperlipidemia Father   . Hypertension Father   . Cancer Father   . Heart disease Brother   . Heart disease Daughter   . Heart disease Maternal Grandmother   . Hypertension Maternal Grandmother   . Heart disease Maternal Grandfather   . Kidney cancer Paternal Grandmother   . Heart  disease Paternal Grandfather     DRUG ALLERGIES:   Allergies  Allergen Reactions  . Penicillins Hives and Rash    No anaphylactic or severe cutaneous Sx > 10 yr ago, no additional medical attention required Tolerates Keflex and Rocephin  . Tetracyclines & Related Other (See Comments)    Syncope and put her "in a coma"  . Tramadol Other (See Comments)    Seizures  . Phenazopyridine Other (See Comments)    Unknown  . Tetracycline Other (See Comments)    Syncope and "Put me in a coma"  . Ciprofloxacin Rash and Itching  . Codeine Itching and Rash  . Estradiol Rash    Patches broke out the skin    REVIEW OF SYSTEMS:   ROS As per  history of present illness. All pertinent systems were reviewed above. Constitutional,  HEENT, cardiovascular, respiratory, GI, GU, musculoskeletal, neuro, psychiatric, endocrine,  integumentary and hematologic systems were reviewed and are otherwise  negative/unremarkable except for positive findings mentioned above in the HPI.   MEDICATIONS AT HOME:   Prior to Admission medications   Medication Sig Start Date End Date Taking? Authorizing Provider  busPIRone (BUSPAR) 15 MG tablet Take 15 mg by mouth 3 (three) times daily.  04/26/17   [provider]  Calcium Carbonate-Vit D-Min (CALCIUM 1200 PO) Take 1,200 mg by mouth daily.     [provider]  cyclobenzaprine (FLEXERIL) 10 MG tablet Take 10 mg by mouth 2 (two) times daily as needed for muscle spasms.     [provider]  DULoxetine (CYMBALTA) 60 MG capsule TAKE 1 CAPSULE BY MOUTH EVERY DAY Patient taking differently: Take 60 mg by mouth at bedtime.  04/11/19   [name], MD  Erenumab-aooe (AIMOVIG) 70 MG/ML SOAJ Inject 70 mg into the skin every 30 (thirty) days. 02/12/19   [name], MD  escitalopram (LEXAPRO) 10 MG tablet Take 10 mg by mouth daily. 08/23/19   [provider]  estradiol (ESTRACE) 1 MG tablet Take 1 tablet (1 mg total) by mouth at bedtime. 08/16/19   [name], MD  fluticasone (FLONASE) 50 MCG/ACT nasal spray SPRAY 2 SPRAYS INTO EACH NOSTRIL EVERY DAY Patient taking differently: Place 2 sprays into both nostrils daily as needed.  12/11/18   [name], MD  gabapentin (NEURONTIN) 600 MG tablet Take 600 mg by mouth 4 (four) times daily.    [provider]  haloperidol (HALDOL) 5 MG tablet Take 1 tablet (5 mg total) by mouth at bedtime. For psychosis 11/15/15   Barton Dubois, MD  levocetirizine (XYZAL) 5 MG tablet TAKE 1 TABLET BY MOUTH EVERY DAY IN THE EVENING 07/19/19   [name], MD  linaclotide [facility]) 290 MCG CAPS capsule TAKE 1 CAPSULE EVERY  MORNING BEFORE BREAKFAST 12/18/18   [name], MD  LINZESS 290 MCG CAPS capsule TAKE 1 CAPSULE BY MOUTH EVERY DAY IN THE MORNING BEFORE BREAKFAST Patient taking differently: Take 290 mcg by mouth daily.  12/18/18   [name], MD  meloxicam (MOBIC) 15 MG tablet Take 15 mg by mouth at bedtime.  09/15/17   [provider]  metoprolol succinate (TOPROL-XL) 25 MG 24 hr tablet Take 1 tablet (25 mg total) by mouth daily. 05/03/19   [name], MD  morphine (MS CONTIN) 30 MG 12 hr tablet Take 30 mg by mouth every 8 (eight) hours.  05/01/18   [provider]  Multiple Vitamins-Minerals (MULTIVITAMIN WITH MINERALS) tablet Take 1  tablet by mouth daily.    [provider]  NARCAN 4 MG/0.1ML LIQD nasal spray kit Place 1 spray into the nose once.  12/19/18   [provider]  nystatin (MYCOSTATIN/NYSTOP) powder APPLY TO AFFECTED AREA 4 TIMES A DAY 06/25/19   [name], MD  oxyCODONE-acetaminophen (PERCOCET/ROXICET) 5-325 MG tablet Take 1 tablet by mouth every 8 (eight) hours as needed for severe pain. 07/19/19   [name], DPM  pantoprazole (PROTONIX) 40 MG tablet TAKE 1 TABLET BY MOUTH TWICE A DAY Patient taking differently: Take 40 mg by mouth 2 (two) times daily.  04/27/19   [name], MD  potassium chloride SA (KLOR-CON M20) 20 MEQ tablet TAKE TWO TABLETS BY MOUTH EVERY DAY 07/30/19   [name], MD  PROAIR HFA 108 [phone] Base) MCG/ACT inhaler Inhale 2 puffs = 173mg into the lungs every 6 (six) hours as needed for wheezing or shortness of breath. Patient taking differently: Inhale 2 puffs into the lungs every 6 (six) hours as needed for wheezing.  01/10/18   PSusy Frizzle MD  silver sulfADIAZINE (SILVADENE) 1 % cream Apply 1 application topically daily. 05/01/19   WTrula Slade DPM  SUMAtriptan (IMITREX) 50 MG tablet TAKE 1 TAB EVERY 2 HOURS AS NEEDED FOR MIGRAINE. MAY REPEAT IN 2 HOURS IF PERSISTS OR RECURS Patient taking  differently: Take 50 mg by mouth every 2 (two) hours as needed for migraine.  03/05/19   PSusy Frizzle MD  topiramate (TOPAMAX) 50 MG tablet TAKE 1 TABLET BY MOUTH TWICE A DAY 06/05/19   PSusy Frizzle MD  traZODone (DESYREL) 100 MG tablet Take 100 mg by mouth at bedtime. 07/29/14   [provider]  XIIDRA 5 % SOLN Place 1 drop into both eyes 2 (two) times daily.  10/20/18   [provider]      VITAL SIGNS:  Blood pressure (!) 143/82, pulse 92, temperature 97.9 F (36.6 C), temperature source Oral, resp. rate 15, height _0  (1.626 m), weight 108.9 kg, SpO2 100 %.  PHYSICAL EXAMINATION:  Physical Exam  GENERAL:  54y.o.-year-old Caucasian female patient lying in the bed with no acute distress.  EYES: Pupils equal, round, reactive to light and accommodation. No scleral icterus. Extraocular muscles intact.  HEENT: Head atraumatic, normocephalic. Oropharynx and nasopharynx clear.  NECK:  Supple, no jugular venous distention. No thyroid enlargement, no tenderness.  LUNGS: Normal breath sounds bilaterally, no wheezing, rales,rhonchi or crepitation. No use of accessory muscles of respiration.  CARDIOVASCULAR: Regular rate and rhythm, S1, S2 normal. No murmurs, rubs, or gallops.  ABDOMEN: Soft, nondistended, nontender. Bowel sounds present. No organomegaly or mass.  EXTREMITIES: No pedal edema, cyanosis, or clubbing.  He is status post right midfoot amputation. NEUROLOGIC: Cranial nerves II through XII are intact. Muscle strength 5/5 in all extremities. Sensation intact. Gait not checked.  PSYCHIATRIC: The patient is alert and oriented x 3.  Normal affect and good eye contact. SKIN: No obvious rash, lesion, or ulcer.   LABORATORY PANEL:   CBC Recent Labs  Lab 08/25/19 2102  WBC 7.3  HGB 12.6  HCT 38.4  PLT 329   ------------------------------------------------------------------------------------------------------------------  Chemistries  Recent Labs  Lab  08/25/19 2102  NA 137  K 3.6  CL 103  CO2 26  GLUCOSE 121*  BUN 9  CREATININE 0.56  CALCIUM 8.9   ------------------------------------------------------------------------------------------------------------------  Cardiac Enzymes No results for input(s): TROPONINI in the last 168 hours. ------------------------------------------------------------------------------------------------------------------  RADIOLOGY:  DG Chest 2 View  Result Date: 08/26/2019 CLINICAL DATA:  Syncope, headache EXAM: CHEST - 2 VIEW COMPARISON:  11/23/2016 FINDINGS: Frontal and lateral views of the chest demonstrate an unremarkable cardiac silhouette. Stable postsurgical changes from thoracolumbar spinal fusion. ACDF at the cervicothoracic junction. No acute bony abnormalities. No airspace disease, effusion, or pneumothorax. IMPRESSION: 1. No acute intrathoracic process. Electronically Signed   By: Randa Ngo M.D.   On: 08/26/2019 00:20   CT Head Wo Contrast  Result Date: 08/26/2019 CLINICAL DATA:  54 year old female with syncope. EXAM: CT HEAD WITHOUT CONTRAST TECHNIQUE: Contiguous axial images were obtained from the base of the skull through the vertex without intravenous contrast. COMPARISON:  Head CT dated 01/18/2015. FINDINGS: Brain: The ventricles and sulci appropriate size for patient's age. The gray-white matter discrimination is preserved. There is no acute intracranial hemorrhage. No mass effect and shift. No extra-axial fluid collection. Vascular: No hyperdense vessel or unexpected calcification. Skull: Normal. Negative for fracture or focal lesion. Sinuses/Orbits: No acute finding. Other: None IMPRESSION: Unremarkable noncontrast CT of the brain. Electronically Signed   By: Anner Crete M.D.   On: 08/26/2019 00:31      IMPRESSION AND PLAN:   1.  Syncope preceded by palpitations. -The patient will be admitted to an observation cardiac progressive unit bed. -We will monitor the patient for  arrhythmias. -We will check her orthostatics. -Serial troponin I's will be followed. -2D echo will be obtained. -Cardiology consult will be obtained.  2.  Hypertension. -We will continue Toprol-XL.  3.  Depression and anxiety and bipolar disorder. -We will continue Lexapro, BuSpar and Haldol.  4.  Chronic back pain. -MS Contin will be continued and as needed Percocet.  5.  GERD. -We will continue PPI therapy.  6.  DVT prophylaxis. -Subcutaneous Lovenox  All the records are reviewed and case discussed with ED provider. The plan of care was discussed in details with the patient (and family). I answered all questions. The patient agreed to proceed with the above mentioned plan. Further management will depend upon hospital course.   CODE STATUS: Full code  Status is: Observation  The patient remains OBS appropriate and will d/c before 2 midnights.  Dispo: The patient is from: Home              Anticipated d/c is to: Home              Anticipated d/c date is: 1 day              Patient currently is not medically stable to d/c.   TOTAL TIME TAKING CARE OF THIS PATIENT: 55 minutes.    Christel Mormon M.D on 08/26/2019 at 2:17 AM  [facility]   From 7 PM-7 AM, contact night-coverage [email or web address]  CC: Primary care physician; [name], MD   Note: This dictation was prepared with Dragon dictation along with smaller phrase technology. Any transcriptional errors that result from this process are unintentional.

## 2019-08-26 NOTE — ED Notes (Signed)
Report received from [place], [place]. Pt is ambulatory to the restroom without assistance. Pt c/o chronic lower back and hip pain. States that Flexeril usually helps. Pt is A&Ox4 and NAD at this time. Pt is connected to cardiac monitor. V/S WNL.

## 2019-08-26 NOTE — Consult Note (Signed)
[facility] Consultation Note  Patient ID: [name] [name], MRN: [id number], DOB/AGE: October 06, 1965 54 y.o. Admit date: 08/26/2019   Date of Consult: 08/26/2019 Primary Physician: [name], MD Primary Cardiologist: None  Chief Complaint:  Chief Complaint  Patient presents with  . Loss of Consciousness   Reason for Consult: Syncope  HPI: 54 y.o. female with no evidence of previous cardiovascular disease but does have hypertension and sinus tachycardia for which she has been on metoprolol which appears to be working fairly well.  The patient has had chronic low back pain with neuropathy for which she had a toe infection and she has had significant pain and concerns for this.  She was apparently doing well when she was in the house and had no recollection of an episode of syncope.  She does not recall having any symptoms prior to during or after but was in the emergency room the next time she noticed.  There was no evidence of significant rhythm disturbances when admitted to the hospital and no evidence of congestive heart failure or anginal or myocardial infarction.  EKG had shown sinus tachycardia otherwise normal EKG.  The patient has had no evidence of abnormality by echocardiogram with normal LV systolic function and no evidence of significant valvular heart disease.  The patient currently feels well other than having infection of toe and back pain.  There is no other current rhythm disturbances like him by telemetry  Past Medical History:  Diagnosis Date  . Anxiety   . Asthma    mild intermittent  . Bipolar 1 disorder ([place])    ect treatments last treatment Sep 02 1011  . Depression   . GERD (gastroesophageal reflux disease)   . Hypertension   . Pre-diabetes   . Seizures ([place])    last seizure was so long ago she can't remember.   . Sleep apnea    wears CPAP, uncertain of setting      Surgical History:  Past Surgical History:  Procedure Laterality Date  .  ABDOMINAL HYSTERECTOMY    . AMPUTATION TOE Left 02/02/2018   Procedure: AMPUTATION TOE Left 4th toe;  Surgeon: [name], DPM;  Location: [facility];  Service: Podiatry;  Laterality: Left;  . BACK SURGERY    . CARPAL TUNNEL RELEASE     x2  . Laproscopic knee surgery    . NECK SURGERY  03/15/2017  . PLANTAR FASCIA RELEASE     x2  . 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 Authorizing Provider  busPIRone (BUSPAR) 15 MG tablet Take 15 mg by mouth 3 (three) times daily.  04/26/17  Yes [provider]  Calcium Carbonate-Vit D-Min (CALCIUM 1200 PO) Take 1,200 mg by mouth daily.    Yes [provider]  cyclobenzaprine (FLEXERIL) 10 MG tablet Take 10 mg by mouth 2 (two) times daily as needed for muscle spasms.    Yes [provider]  DULoxetine (CYMBALTA) 60 MG capsule TAKE 1 CAPSULE BY MOUTH EVERY DAY Patient taking differently: Take 60 mg by mouth at bedtime.  04/11/19  Yes [name], MD  Erenumab-aooe (AIMOVIG) 70 MG/ML SOAJ Inject 70 mg into the skin every 30 (thirty) days. 02/12/19  Yes [name], MD  escitalopram (LEXAPRO) 10 MG tablet  Take 10 mg by mouth daily. 08/23/19  Yes [provider]  estradiol (ESTRACE) 1 MG tablet Take 1 tablet (1 mg total) by mouth at bedtime. 08/16/19  Yes [name], MD  fluticasone (FLONASE) 50 MCG/ACT nasal spray SPRAY 2 SPRAYS INTO EACH NOSTRIL EVERY DAY Patient taking differently: Place 2 sprays into both nostrils daily as needed.  12/11/18  Yes [name], MD  gabapentin (NEURONTIN) 600 MG tablet Take 600 mg by mouth 4 (four) times daily.   Yes [provider]  haloperidol (HALDOL) 5 MG tablet Take 1 tablet (5 mg total) by mouth at bedtime. For psychosis  11/15/15  Yes Barton Dubois, MD  levocetirizine (XYZAL) 5 MG tablet TAKE 1 TABLET BY MOUTH EVERY DAY IN THE EVENING 07/19/19  Yes [name], MD  linaclotide [facility]) 290 MCG CAPS capsule TAKE 1 CAPSULE EVERY MORNING BEFORE BREAKFAST 12/18/18  Yes [name], MD  meloxicam (MOBIC) 15 MG tablet Take 15 mg by mouth at bedtime.  09/15/17  Yes [provider]  metoprolol succinate (TOPROL-XL) 25 MG 24 hr tablet Take 1 tablet (25 mg total) by mouth daily. 05/03/19  Yes [name], MD  morphine (MS CONTIN) 30 MG 12 hr tablet Take 30 mg by mouth every 8 (eight) hours.  05/01/18  Yes [provider]  Multiple Vitamins-Minerals (MULTIVITAMIN WITH MINERALS) tablet Take 1 tablet by mouth daily.   Yes [provider]  nystatin (MYCOSTATIN/NYSTOP) powder APPLY TO AFFECTED AREA 4 TIMES A DAY 06/25/19  Yes [name], MD  pantoprazole (PROTONIX) 40 MG tablet TAKE 1 TABLET BY MOUTH TWICE A DAY Patient taking differently: Take 40 mg by mouth 2 (two) times daily.  04/27/19  Yes [name], MD  potassium chloride SA (KLOR-CON M20) 20 MEQ tablet TAKE TWO TABLETS BY MOUTH EVERY DAY 07/30/19  Yes [name], MD  PROAIR HFA 108 [phone] Base) MCG/ACT inhaler Inhale 2 puffs = 128mg into the lungs every 6 (six) hours as needed for wheezing or shortness of breath. Patient taking differently: Inhale 2 puffs into the lungs every 6 (six) hours as needed for wheezing.  01/10/18  Yes PSusy Frizzle MD  SUMAtriptan (IMITREX) 50 MG tablet TAKE 1 TAB EVERY 2 HOURS AS NEEDED FOR MIGRAINE. MAY REPEAT IN 2 HOURS IF PERSISTS OR RECURS Patient taking differently: Take 50 mg by mouth every 2 (two) hours as needed for migraine.  03/05/19  Yes PSusy Frizzle MD  topiramate (TOPAMAX) 50 MG tablet TAKE 1 TABLET BY MOUTH TWICE A DAY 06/05/19  Yes PSusy Frizzle MD  traZODone (DESYREL) 100 MG tablet Take 100 mg by mouth at bedtime. 07/29/14  Yes [provider]  NARCAN 4  MG/0.1ML LIQD nasal spray kit Place 1 spray into the nose once.  12/19/18   [provider]  oxyCODONE-acetaminophen (PERCOCET/ROXICET) 5-325 MG tablet Take 1 tablet by mouth every 8 (eight) hours as needed for severe pain. Patient not taking: Reported on 08/26/2019 07/19/19   WTrula Slade DPM  silver sulfADIAZINE (SILVADENE) 1 % cream Apply 1 application topically daily. Patient not taking: Reported on 08/26/2019 05/01/19   WTrula Slade DPM  XIIDRA 5 % SOLN Place 1 drop into both eyes 2 (two) times daily.  10/20/18   [provider]    Inpatient Medications:  . busPIRone  15 mg Oral TID  . calcium-vitamin D  1 tablet Oral Q breakfast  . [START ON 08/27/2019] cephALEXin  500 mg Oral Q12H  . DULoxetine  60 mg Oral QHS  . enoxaparin (LOVENOX) injection  40 mg Subcutaneous BID  . escitalopram  10 mg Oral Daily  . estradiol  1 mg Oral QHS  . gabapentin  600 mg Oral QID  . haloperidol  5 mg Oral QHS  . linaclotide  290 mcg Oral QAC breakfast  . loratadine  10 mg Oral QPM  . metoprolol succinate  25 mg Oral Daily  . morphine  30 mg Oral Q8H  . multivitamin with minerals  1 tablet Oral Daily  . potassium chloride SA  10 mEq Oral Daily  . sodium chloride flush  3 mL Intravenous Q12H  . topiramate  50 mg Oral BID  . traZODone  100 mg Oral QHS   . sodium chloride Stopped (08/26/19 1211)  . vancomycin 2,000 mg (08/26/19 1802)   Followed by  . vancomycin      Allergies:  Allergies  Allergen Reactions  . Penicillins Hives and Rash    No anaphylactic or severe cutaneous Sx > 10 yr ago, no additional medical attention required Tolerates Keflex and Rocephin  . Tetracyclines & Related Other (See Comments)    Syncope and put her "in a coma"  . Tramadol Other (See Comments)    Seizures  . Phenazopyridine Other (See Comments)    Unknown  . Tetracycline Other (See Comments)    Syncope and "Put me in a coma"  . Ciprofloxacin Rash and Itching  . Codeine Itching and Rash   . Estradiol Rash    Patches broke out the skin    Social History   Socioeconomic History  . Marital status: Widowed    Spouse name: Not on file  . Number of children: 3  . Years of education: 35  . Highest education level: Not on file  Occupational History  . Occupation: Disabled  Tobacco Use  . Smoking status: Never Smoker  . Smokeless tobacco: Never Used  Substance and Sexual Activity  . Alcohol use: No  . Drug use: No  . Sexual activity: Not on file  Other Topics Concern  . Not on file  Social History Narrative   Patient drink 4 16oz caffeine drinks a day    Social Determinants of Health   Financial Resource Strain:   . Difficulty of Paying Living Expenses:   Food Insecurity:   . Worried About Charity fundraiser in the Last Year:   . Arboriculturist in the Last Year:   Transportation Needs:   . Film/video editor (Medical):   Marland Kitchen Lack of Transportation (Non-Medical):   Physical Activity:   . Days of Exercise per Week:   . Minutes of Exercise per Session:   Stress:   . Feeling of Stress :   Social Connections:   . Frequency of Communication with Friends and Family:   . Frequency of Social Gatherings with Friends and Family:   . Attends Religious Services:   . Active Member of Clubs or Organizations:   . Attends Archivist Meetings:   Marland Kitchen Marital Status:   Intimate Partner Violence:   . Fear of Current or Ex-Partner:   . Emotionally Abused:   Marland Kitchen Physically Abused:   . Sexually Abused:      Family History  Problem Relation Age of Onset  . Diabetes Mother   . COPD Mother   . Hypertension Mother   . Hyperlipidemia Mother   . Heart disease Father   .  Hyperlipidemia Father   . Hypertension Father   . Cancer Father   . Heart disease Brother   . Heart disease Daughter   . Heart disease Maternal Grandmother   . Hypertension Maternal Grandmother   . Heart disease Maternal Grandfather   . Kidney cancer Paternal Grandmother   . Heart disease  Paternal Grandfather      Review of Systems Positive for syncope Negative for: General:  chills, fever, night sweats or weight changes.  Cardiovascular: PND orthopnea positive for syncope dizziness  Dermatological skin lesions rashes Respiratory: Cough congestion Urologic: Frequent urination urination at night and hematuria Abdominal: negative for nausea, vomiting, diarrhea, bright red blood per rectum, melena, or hematemesis Neurologic: negative for visual changes, and/or hearing changes  All other systems reviewed and are otherwise negative except as noted above.  Labs: No results for input(s): CKTOTAL, CKMB, TROPONINI in the last 72 hours. Lab Results  Component Value Date   WBC 8.3 08/26/2019   HGB 11.6 (L) 08/26/2019   HCT 35.4 (L) 08/26/2019   MCV 94.9 08/26/2019   PLT 294 08/26/2019    Recent Labs  Lab 08/26/19 0656  NA 139  K 3.8  CL 106  CO2 25  BUN 8  CREATININE 0.61  CALCIUM 8.5*  GLUCOSE 97   Lab Results  Component Value Date   CHOL 189 02/08/2019   HDL 71 02/08/2019   LDLCALC 89 02/08/2019   TRIG 192 (H) 02/08/2019   Lab Results  Component Value Date   DDIMER 0.62 (H) 11/23/2016    Radiology/Studies:  DG Chest 2 View  Result Date: 08/26/2019 CLINICAL DATA:  Syncope, headache EXAM: CHEST - 2 VIEW COMPARISON:  11/23/2016 FINDINGS: Frontal and lateral views of the chest demonstrate an unremarkable cardiac silhouette. Stable postsurgical changes from thoracolumbar spinal fusion. ACDF at the cervicothoracic junction. No acute bony abnormalities. No airspace disease, effusion, or pneumothorax. IMPRESSION: 1. No acute intrathoracic process. Electronically Signed   By: Randa Ngo M.D.   On: 08/26/2019 00:20   CT Head Wo Contrast  Result Date: 08/26/2019 CLINICAL DATA:  54 year old female with syncope. EXAM: CT HEAD WITHOUT CONTRAST TECHNIQUE: Contiguous axial images were obtained from the base of the skull through the vertex without intravenous contrast.  COMPARISON:  Head CT dated 01/18/2015. FINDINGS: Brain: The ventricles and sulci appropriate size for patient's age. The gray-white matter discrimination is preserved. There is no acute intracranial hemorrhage. No mass effect and shift. No extra-axial fluid collection. Vascular: No hyperdense vessel or unexpected calcification. Skull: Normal. Negative for fracture or focal lesion. Sinuses/Orbits: No acute finding. Other: None IMPRESSION: Unremarkable noncontrast CT of the brain. Electronically Signed   By: Anner Crete M.D.   On: 08/26/2019 00:31   DG Hand Complete Left  Result Date: 08/26/2019 CLINICAL DATA:  Initial evaluation for acute hand swelling. Possible injury. EXAM: LEFT HAND - COMPLETE 3+ VIEW COMPARISON:  None. FINDINGS: No acute fracture dislocation. Prominent soft tissue swelling present over the dorsum of the hand moderate osteoarthritic changes present throughout the hand, most notable at the first IP and fifth PIP joints. Osseous mineralization normal. IMPRESSION: 1. No acute osseous abnormality. 2. Prominent soft tissue swelling over the eye the dorsal aspect of the hand. 3. 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 Electronically Signed   By: Jeannine Boga M.D.   On: 08/26/2019 04:25   DG Foot Complete Left  Result Date: 08/26/2019 CLINICAL DATA:  Infection of left great toe. EXAM: LEFT FOOT - COMPLETE 3+ VIEW COMPARISON:  05/09/2018 FINDINGS: There is no evidence of fracture or dislocation. No evidence of osteolysis or periostitis. There is no evidence of arthropathy or other focal bone lesion. Fusion bony fusion of the PIP joint of the middle stone is also seen. 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 Old fracture deformity of the proximal phalanx of the 2nd toe is unchanged. No significant changes seen compared to prior exam. IMPRESSION: No radiographic evidence of osteomyelitis or other acute findings. Electronically Signed   By: Marlaine Hind  M.D.   On: 08/26/2019 16:36   ECHOCARDIOGRAM COMPLETE  Result Date: 08/26/2019    ECHOCARDIOGRAM REPORT   Patient Name:   KARIANN WECKER Date of Exam: 08/26/2019 Medical Rec #:  [id number]        Height:       64.0 in Accession #:    [id number]       Weight:       240.0 lb Date of Birth:  [birth year]        BSA:          2.114 m Patient Age:    10 years         BP:           127/81 mmHg Patient Gender: F                HR:           92 bpm. Exam Location:  [facility] Procedure: 2D Echo, Cardiac Doppler and Color Doppler Indications:     Syncope 780.2 / R55  History:         Patient has no prior history of Echocardiogram examinations.                  Risk Factors:Hypertension. Pre-diabetic.  Sonographer:     Alyse Low Roar Referring Phys:  [id number] [place] Diagnosing Phys: Serafina Royals MD IMPRESSIONS  1. Left ventricular ejection fraction, by estimation, is 55 to 60%. The left ventricle has normal function. The left ventricle has no regional wall motion abnormalities. Left ventricular diastolic parameters were normal.  2. Right ventricular systolic function is normal. The right ventricular size is normal. There is normal pulmonary artery systolic pressure.  3. The mitral valve is normal in structure. Mild mitral valve regurgitation.  4. The aortic valve is normal in structure. Aortic valve regurgitation is not visualized. FINDINGS  Left Ventricle: Left ventricular ejection fraction, by estimation, is 55 to 60%. The left ventricle has normal function. The left ventricle has no regional wall motion abnormalities. The left ventricular internal cavity size was normal in size. There is  no left ventricular hypertrophy. Left ventricular diastolic parameters were normal. Right Ventricle: The right ventricular size is normal. No increase in right ventricular wall thickness. Right ventricular systolic function is normal. There is normal pulmonary artery systolic pressure. The tricuspid regurgitant velocity is 2.52 m/s, and   with an assumed right atrial pressure of 10 mmHg, the estimated right ventricular systolic pressure is 34.7 mmHg. Left Atrium: Left atrial size was normal in size. Right Atrium: Right atrial size was normal in size. Pericardium: There is no evidence of pericardial effusion. Mitral Valve: The mitral valve is normal in structure. Mild mitral valve regurgitation. Tricuspid Valve: The tricuspid valve is normal in structure. Tricuspid valve regurgitation is trivial. Aortic Valve: The aortic valve is normal in structure. Aortic valve regurgitation is not visualized. Aortic valve mean gradient measures 4.0 mmHg. Aortic valve peak gradient measures 6.2 mmHg. Aortic  valve area, by VTI measures 2.07 cm. Pulmonic Valve: The pulmonic valve was normal in structure. Pulmonic valve regurgitation is not visualized. Aorta: The aortic root and ascending aorta are structurally normal, with no evidence of dilitation. IAS/Shunts: No atrial level shunt detected by color flow Doppler.  LEFT VENTRICLE PLAX 2D LVIDd:         4.02 cm  Diastology LVIDs:         2.88 cm  LV e' lateral:   7.40 cm/s LV PW:         1.02 cm  LV E/e' lateral: 11.9 LV IVS:        1.33 cm  LV e' medial:    5.11 cm/s LVOT diam:     1.90 cm  LV E/e' medial:  17.2 LV SV:         51 LV SV Index:   24 LVOT Area:     2.84 cm  RIGHT VENTRICLE RV Mid diam:    2.46 cm RV S prime:     14.60 cm/s TAPSE (M-mode): 1.3 cm LEFT ATRIUM             Index       RIGHT ATRIUM           Index LA diam:        3.90 cm 1.84 cm/m  RA Area:     10.80 cm LA Vol (A2C):   58.2 ml 27.53 ml/m RA Volume:   18.80 ml  8.89 ml/m LA Vol (A4C):   50.5 ml 23.89 ml/m LA Biplane Vol: 55.7 ml 26.35 ml/m  AORTIC VALVE                   PULMONIC VALVE AV Area (Vmax):    2.17 cm    PV Vmax:        1.18 m/s AV Area (Vmean):   1.94 cm    PV Peak grad:   5.6 mmHg AV Area (VTI):     2.07 cm    RVOT Peak grad: 4 mmHg AV Vmax:           125.00 cm/s AV Vmean:          90.400 cm/s AV VTI:            0.245 m  AV Peak Grad:      6.2 mmHg AV Mean Grad:      4.0 mmHg LVOT Vmax:         95.80 cm/s LVOT Vmean:        61.900 cm/s LVOT VTI:          0.179 m LVOT/AV VTI ratio: 0.73  AORTA Ao Root diam: 2.80 cm MITRAL VALVE                TRICUSPID VALVE MV Area (PHT): 8.72 cm     TR Peak grad:   25.4 mmHg MV Decel Time: 87 msec      TR Vmax:        252.00 cm/s MV E velocity: 87.90 cm/s MV A velocity: 106.00 cm/s  SHUNTS MV E/A ratio:  0.83         Systemic VTI:  0.18 m MV A Prime:    16.5 cm/s    Systemic Diam: 1.90 cm Serafina Royals MD Electronically signed by Serafina Royals MD Signature Date/Time: 08/26/2019/7:44:12 PM    Final     EKG: Normal sinus rhythm otherwise normal EKG  Weights: Filed Weights   08/25/19 2100  08/26/19 1511  Weight: 108.9 kg 107.2 kg     Physical Exam: Blood pressure 133/72, pulse 84, temperature 98.3 F (36.8 C), resp. rate 17, height '5\' 4"'$  (1.626 m), weight 107.2 kg, SpO2 98 %. Body mass index is 40.58 kg/m. General: Well developed, well nourished, in no acute distress. Head eyes ears nose throat: Normocephalic, atraumatic, sclera non-icteric, no xanthomas, nares are without discharge. No apparent thyromegaly and/or mass  Lungs: Normal respiratory effort.  no wheezes, no rales, no rhonchi.  Heart: RRR with normal S1 S2. no murmur gallop, no rub, PMI is normal size and placement, carotid upstroke normal without bruit, jugular venous pressure is normal Abdomen: Soft, non-tender, non-distended with normoactive bowel sounds. No hepatomegaly. No rebound/guarding. No obvious abdominal masses. Abdominal aorta is normal size without bruit Extremities: No edema. no cyanosis, no clubbing, positive for ulcers  Peripheral : 2+ bilateral upper extremity pulses, 2+ bilateral femoral pulses, 2+ bilateral dorsal pedal pulse Neuro: Alert and oriented. No facial asymmetry. No focal deficit. Moves all extremities spontaneously. Musculoskeletal: Normal muscle tone without kyphosis Psych:  Responds  to questions appropriately with a normal affect.    Assessment: 54 year old female with hypertension and tachycardia currently well controlled on metoprolol having an episode of syncope of unknown etiology without evidence of injury rhythm disturbances congestive heart failure or angina currently stable with a normal LV systolic function by echocardiogram and toe infection  Plan: 1.  Continue telemetry to her for 24 hours watching closely for any rhythm disturbances possibly causing concerns for rhythm disturbances that can cause syncope 2.  Continue infection control and treatment of toe ulcer 3.  Hypertension and tachycardia control with metoprolol without change 4.  No further cardiac diagnostics necessary at this time 5.  Begin ambulation and if no further significant symptoms or concerns okay for discharge home from cardiac standpoint with follow-up in several weeks to assess if needs for further intervention if syncope recurs  Signed, Corey Skains M.D. [facility] 08/26/2019, 8:00 PM

## 2019-08-26 NOTE — Progress Notes (Signed)
PROGRESS NOTE  [name] [name] [id number] DOB: 1965/11/15 DOA: 08/26/2019 PCP: [name], MD  Brief History   [name] [name]  is a 54 y.o. Caucasian female with a known history of asthma, bipolar disorder, hypertension and chronic back pain, obstructive sleep apnea on home CPAP, who presented to the emergency room with acute onset of syncope preceded by palpitations without chest pains.  She had subsequent left hand swelling.  She admitted to mild dyspnea but denied any cough or wheezing.  No fever or chills.  No nausea or vomiting or abdominal pain.  No dysuria, oliguria or hematuria or flank pain.  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  Upon presentation to the emergency room, blood pressure was 141/81 with otherwise normal vital signs.  Labs revealed unremarkable BMP and CBC.  Urinalysis came back negative and urine drug screen came back positive for opiates and tricyclics. 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  Two-view chest x-ray showed no acute cardiopulmonary disease.  The patient has been admitted to a progressive bed. Cardiology has been consulted. The patient has also just been noted by nursing to have an infection of her left great toe.   Consultants  . Cardiology  Procedures  . None  Antibiotics   Anti-infectives (From admission, onward)   None    .  Subjective  The patient is resting quietly. No new complaints. No further palpitations. Echocardiogram is pending.  Objective   Vitals:  Vitals:   08/26/19 1300 08/26/19 1400  BP: 121/77 118/67  Pulse: 83 87  Resp: 11 10  Temp:    SpO2: 96% 96%   Exam:  Constitutional:  . The patient is awake, alert, and oriented x 3. No acute distress. Respiratory:  . No increased work of breathing. . No wheezes, rales, or rhonchi . No  tactile fremitus Cardiovascular:  . Regular rate and rhythm . No murmurs, ectopy, or gallups. . No lateral PMI. No thrills. Abdomen:  . Abdomen is soft, non-tender, non-distended . No hernias, masses, or organomegaly . Normoactive bowel sounds.  Musculoskeletal:  . No cyanosis, clubbing, or edema . Left gret toe is erythematous and ulcertated. Skin:  . No rashes, lesions, ulcers . palpation of skin: no induration or nodules Neurologic:  . CN 2-12 intact . Sensation all 4 extremities intact Psychiatric:  . Mental status o Mood, affect appropriate o Orientation to person, place, time  . judgment and insight appear intact   I have personally reviewed the following:   Today's Data  . Vitals, BMP, CBC  Cardiology Data  . EKG . Echocardiogram: Pending.  Scheduled Meds: . busPIRone  15 mg Oral TID  . calcium-vitamin D  1 tablet Oral Q breakfast  . DULoxetine  60 mg Oral QHS  . enoxaparin (LOVENOX) injection  40 mg Subcutaneous BID  . escitalopram  10 mg Oral Daily  . estradiol  1 mg Oral QHS  . gabapentin  600 mg Oral QID  . haloperidol  5 mg Oral QHS  . linaclotide  290 mcg Oral QAC breakfast  . loratadine  10 mg Oral QPM  . metoprolol succinate  25 mg Oral Daily  . morphine  30 mg Oral Q8H  . multivitamin with minerals  1 tablet Oral Daily  . potassium chloride SA  10 mEq Oral Daily  . sodium chloride flush  3  mL Intravenous Q12H  . topiramate  50 mg Oral BID  . traZODone  100 mg Oral QHS   Continuous Infusions: . sodium chloride Stopped (08/26/19 1211)    Active Problems:   Syncope   LOS: 0 days   A & P  Syncope preceded by palpitations: The patient will be admitted to a cardiac progressive unit bed. Cardiology has been consulted. Echocardiogram is pending. Orthostatics will be checked. Serial troponins are negative and flat.  Infection of left great toe: Start Pt on Vanc and Rocephin. Consider podiatry consult in the am.  Hypertension: Continue  Toprol-XL  Depression and anxiety and bipolar disorder: We will continue Lexapro, BuSpar and Haldol.  Chronic back pain: MS Contin will be continued and as needed Percocet.  GERD: We will continue PPI therapy.  I have seen and examined this patient myself. I have spent 38 minutes in her evaluation and care.  DVT prophylaxis: Lovenox CODE STATUS: Full code Family Communication: None available Dispo: The patient is from: Home It is anticipated that the patient will be discharged to home. Barriers to discharge include completion of work up as well as evaluation of left great toe infection.  [name] Conde, DO [facility] Direct contact: see [email or web address]  7PM-7AM contact night coverage as above 08/26/2019, 3:08 PM  LOS: 0 days

## 2019-08-26 NOTE — Progress Notes (Signed)
Pharmacy Antibiotic Note  [name] [name] is a 54 y.o. female admitted on 08/26/2019 with left great toe infection.  Pharmacy has been consulted for Vanc dosing.  Plan: Vancomycin 2500 mg IV X 1 ordered to be given on 5/9 @ ~ 1700. Vancomycin 1750 mg IV Q24H ordered to start on 5/10 @ 1700.  AUC = 523.9  Vanc trough = 10.8 mcg/mL Vd = 54.5 L  Ke = 0.061 hr-1 T1/2 = 11.3 hrs  Height: 5\' 4"  (162.6 cm) Weight: 108.9 kg (240 lb) IBW/kg (Calculated) : 54.7  Temp (24hrs), Avg:98.2 F (36.8 C), Min:97.9 F (36.6 C), Max:98.5 F (36.9 C)  Recent Labs  Lab 08/25/19 2102 08/26/19 0656  WBC 7.3 8.3  CREATININE 0.56 0.61    Estimated Creatinine Clearance: 95.8 mL/min (by C-G formula based on SCr of 0.61 mg/dL).    Allergies  Allergen Reactions  . Penicillins Hives and Rash    No anaphylactic or severe cutaneous Sx > 10 yr ago, no additional medical attention required Tolerates Keflex and Rocephin  . Tetracyclines & Related Other (See Comments)    Syncope and put her "in a coma"  . Tramadol Other (See Comments)    Seizures  . Phenazopyridine Other (See Comments)    Unknown  . Tetracycline Other (See Comments)    Syncope and "Put me in a coma"  . Ciprofloxacin Rash and Itching  . Codeine Itching and Rash  . Estradiol Rash    Patches broke out the skin    Antimicrobials this admission:   >>    >>   Dose adjustments this admission:   Microbiology results:  BCx:   UCx:    Sputum:    MRSA PCR:   Thank you for allowing pharmacy to be a part of this patient's care.  Gautam Langhorst D 08/26/2019 3:51 PM

## 2019-08-26 NOTE — ED Notes (Signed)
Breakfast meal given 

## 2019-08-26 NOTE — Progress Notes (Signed)
*  PRELIMINARY RESULTS* Echocardiogram 2D Echocardiogram has been performed.  [name] [name] 08/26/2019, 12:33 PM

## 2019-08-26 NOTE — ED Notes (Signed)
Call bell answered, pt requesting pain meds, told that pharm was MSG'd approx 25 min ago for morphine

## 2019-08-27 DIAGNOSIS — L089 Local infection of the skin and subcutaneous tissue, unspecified: Secondary | ICD-10-CM

## 2019-08-27 DIAGNOSIS — F319 Bipolar disorder, unspecified: Secondary | ICD-10-CM

## 2019-08-27 LAB — CBC
HCT: 38.9 % (ref 36.0–46.0)
Hemoglobin: 12.4 g/dL (ref 12.0–15.0)
MCH: 31 pg (ref 26.0–34.0)
MCHC: 31.9 g/dL (ref 30.0–36.0)
MCV: 97.3 fL (ref 80.0–100.0)
Platelets: 303 10*3/uL (ref 150–400)
RBC: 4 MIL/uL (ref 3.87–5.11)
RDW: 14.5 % (ref 11.5–15.5)
WBC: 5.4 10*3/uL (ref 4.0–10.5)
nRBC: 0 % (ref 0.0–0.2)

## 2019-08-27 LAB — BASIC METABOLIC PANEL
Anion gap: 8 (ref 5–15)
BUN: 10 mg/dL (ref 6–20)
CO2: 28 mmol/L (ref 22–32)
Calcium: 9.1 mg/dL (ref 8.9–10.3)
Chloride: 104 mmol/L (ref 98–111)
Creatinine, Ser: 0.64 mg/dL (ref 0.44–1.00)
GFR calc Af Amer: >60 mL/min (ref >60)
GFR calc non Af Amer: >60 mL/min (ref >60)
Glucose, Bld: 101 mg/dL — ABNORMAL HIGH (ref 70–99)
Potassium: 4.3 mmol/L (ref 3.5–5.1)
Sodium: 140 mmol/L (ref 135–145)

## 2019-08-27 LAB — GLUCOSE, CAPILLARY: Glucose-Capillary: 108 mg/dL — ABNORMAL HIGH (ref 70–99)

## 2019-08-27 MED ORDER — FAMOTIDINE 20 MG PO TABS
20.0000 mg | ORAL_TABLET | Freq: Two times a day (BID) | ORAL | Status: DC
  Administered 2019-08-27 – 2019-08-28 (×3): 20 mg via ORAL
  Filled 2019-08-27 (×3): qty 1

## 2019-08-27 MED ORDER — CEPHALEXIN 500 MG PO CAPS
500.0000 mg | ORAL_CAPSULE | Freq: Four times a day (QID) | ORAL | Status: DC
  Administered 2019-08-27 – 2019-08-28 (×5): 500 mg via ORAL
  Filled 2019-08-27 (×5): qty 1

## 2019-08-27 NOTE — Progress Notes (Signed)
[facility] Card[facility] Encounter Note  Patient: [name] [name] / Admit Date: 08/26/2019 / Date of Encounter: 08/27/2019, 1:20 PM   Subjective: pateint not feeling well but no evidence of cardiac sx. No chf or angina, heart rate stable  Echo normal with EF 55%  Review of Systems: Positive [id number] Negative for: Vision change, hearing change, syncope, dizziness, nausea, vomiting,diarrhea, bloody stool, stomach pain, cough, congestion, diaphoresis, urinary frequency, urinary pain,skin lesions, skin rashes Others previously listed  Objective: Telemetry: nsr Physical Exam: Blood pressure 114/78, pulse 74, temperature 97.6 F (36.4 C), resp. rate 18, height 5\' 4"  (1.626 m), weight 107.2 kg, SpO2 97 %. Body mass index is 40.56 kg/m. General: Well developed, well nourished, in no acute distress. Head: Normocephalic, atraumatic, sclera non-icteric, no xanthomas, nares are without discharge. Neck: No apparent masses Lungs: Normal respirations with no wheezes, no rhonchi, no rales , no crackles   Heart: Regular rate and rhythm, normal S1 S2, no murmur, no rub, no gallop, PMI is normal size and placement, carotid upstroke normal without bruit, jugular venous pressure normal Abdomen: Soft, non-tender, non-distended with normoactive bowel sounds. No hepatosplenomegaly. Abdominal aorta is normal size without bruit Extremities: No edema, no clubbing, no cyanosis, no ulcers,  Peripheral: 2+ radial, 2+ femoral, 2+ dorsal pedal pulses Neuro: Alert and oriented. Moves all extremities spontaneously. Psych:  Responds to questions appropriately with a normal affect.   Intake/Output Summary (Last 24 hours) at 08/27/2019 1320 Last data filed at 08/27/2019 1005 Gross per 24 hour  Intake 1781.67 ml  Output 2900 ml  Net -1118.33 ml    Inpatient Medications:  . busPIRone  15 mg Oral TID  . calcium-vitamin D  1 tablet Oral Q breakfast  . cephALEXin  500 mg Oral Q6H  . DULoxetine  60 mg  Oral QHS  . enoxaparin (LOVENOX) injection  40 mg Subcutaneous BID  . escitalopram  10 mg Oral Daily  . estradiol  1 mg Oral QHS  . famotidine  20 mg Oral BID  . gabapentin  600 mg Oral QID  . haloperidol  5 mg Oral QHS  . linaclotide  290 mcg Oral QAC breakfast  . loratadine  10 mg Oral QPM  . meloxicam  15 mg Oral QHS  . metoprolol succinate  25 mg Oral Daily  . morphine  30 mg Oral Q8H  . multivitamin with minerals  1 tablet Oral Daily  . potassium chloride SA  10 mEq Oral Daily  . sodium chloride flush  3 mL Intravenous Q12H  . topiramate  50 mg Oral BID  . traZODone  100 mg Oral QHS   Infusions:  . sodium chloride Stopped (08/26/19 1211)    Labs: Recent Labs    08/26/19 0656 08/27/19 0540  NA 139 140  K 3.8 4.3  CL 106 104  CO2 25 28  GLUCOSE 97 101*  BUN 8 10  CREATININE 0.61 0.64  CALCIUM 8.5* 9.1   No results for input(s): AST, ALT, ALKPHOS, BILITOT, PROT, ALBUMIN in the last 72 hours. Recent Labs    08/26/19 0656 08/27/19 0540  WBC 8.3 5.4  HGB 11.6* 12.4  HCT 35.4* 38.9  MCV 94.9 97.3  PLT 294 303   No results for input(s): CKTOTAL, CKMB, TROPONINI in the last 72 hours. Invalid input(s): POCBNP Recent Labs    08/26/19 0656  HGBA1C 5.6     Weights: Filed Weights   08/25/19 2100 08/26/19 1511 08/27/19 0352  Weight: 108.9 kg 107.2 kg 107.2 kg  Radiology/Studies:  DG Chest 2 View  Result Date: 08/26/2019 CLINICAL DATA:  Syncope, headache EXAM: CHEST - 2 VIEW COMPARISON:  11/23/2016 FINDINGS: Frontal and lateral views of the chest demonstrate an unremarkable cardiac silhouette. Stable postsurgical changes from thoracolumbar spinal fusion. ACDF at the cervicothoracic junction. No acute bony abnormalities. No airspace disease, effusion, or pneumothorax. IMPRESSION: 1. No acute intrathoracic process. Electronically Signed   By: Randa Ngo M.D.   On: 08/26/2019 00:20   CT Head Wo Contrast  Result Date: 08/26/2019 CLINICAL DATA:  54 year old  female with syncope. EXAM: CT HEAD WITHOUT CONTRAST TECHNIQUE: Contiguous axial images were obtained from the base of the skull through the vertex without intravenous contrast. COMPARISON:  Head CT dated 01/18/2015. FINDINGS: Brain: The ventricles and sulci appropriate size for patient's age. The gray-white matter discrimination is preserved. There is no acute intracranial hemorrhage. No mass effect and shift. No extra-axial fluid collection. Vascular: No hyperdense vessel or unexpected calcification. Skull: Normal. Negative for fracture or focal lesion. Sinuses/Orbits: No acute finding. Other: None IMPRESSION: Unremarkable noncontrast CT of the brain. Electronically Signed   By: Anner Crete M.D.   On: 08/26/2019 00:31   DG Hand Complete Left  Result Date: 08/26/2019 CLINICAL DATA:  Initial evaluation for acute hand swelling. Possible injury. EXAM: LEFT HAND - COMPLETE 3+ VIEW COMPARISON:  None. FINDINGS: No acute fracture dislocation. Prominent soft tissue swelling present over the dorsum of the hand moderate osteoarthritic changes present throughout the hand, most notable at the first IP and fifth PIP joints. Osseous mineralization normal. IMPRESSION: 1. No acute osseous abnormality. 2. Prominent soft tissue swelling over the eye the dorsal aspect of the hand. 3. 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 Electronically Signed   By: Jeannine Boga M.D.   On: 08/26/2019 04:25   DG Foot Complete Left  Result Date: 08/26/2019 CLINICAL DATA:  Infection of left great toe. EXAM: LEFT FOOT - COMPLETE 3+ VIEW COMPARISON:  05/09/2018 FINDINGS: There is no evidence of fracture or dislocation. No evidence of osteolysis or periostitis. There is no evidence of arthropathy or other focal bone lesion. Fusion bony fusion of the PIP joint of the middle stone is also seen. 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 Old fracture deformity of the proximal phalanx of the 2nd toe is  unchanged. No significant changes seen compared to prior exam. IMPRESSION: No radiographic evidence of osteomyelitis or other acute findings. Electronically Signed   By: Marlaine Hind M.D.   On: 08/26/2019 16:36   ECHOCARDIOGRAM COMPLETE  Result Date: 08/26/2019    ECHOCARDIOGRAM REPORT   Patient Name:   [name] [name] Date of Exam: 08/26/2019 Medical Rec #:  [id number]        Height:       64.0 in Accession #:    [id number]       Weight:       240.0 lb Date of Birth:  1965-12-14        BSA:          2.114 m Patient Age:    32 years         BP:           127/81 mmHg Patient Gender: F                HR:           92 bpm. Exam Location:  [facility] Procedure: 2D Echo, Cardiac Doppler and Color Doppler Indications:     Syncope  780.2 / R55  History:         Patient has no prior history of Echocardiogram examinations.                  Risk Factors:Hypertension. Pre-diabetic.  Sonographer:     Alyse Low Roar Referring Phys:  [id number] Baker Diagnosing Phys: Serafina Royals MD IMPRESSIONS  1. Left ventricular ejection fraction, by estimation, is 55 to 60%. The left ventricle has normal function. The left ventricle has no regional wall motion abnormalities. Left ventricular diastolic parameters were normal.  2. Right ventricular systolic function is normal. The right ventricular size is normal. There is normal pulmonary artery systolic pressure.  3. The mitral valve is normal in structure. Mild mitral valve regurgitation.  4. The aortic valve is normal in structure. Aortic valve regurgitation is not visualized. FINDINGS  Left Ventricle: Left ventricular ejection fraction, by estimation, is 55 to 60%. The left ventricle has normal function. The left ventricle has no regional wall motion abnormalities. The left ventricular internal cavity size was normal in size. There is  no left ventricular hypertrophy. Left ventricular diastolic parameters were normal. Right Ventricle: The right ventricular size is normal. No increase in right  ventricular wall thickness. Right ventricular systolic function is normal. There is normal pulmonary artery systolic pressure. The tricuspid regurgitant velocity is 2.52 m/s, and  with an assumed right atrial pressure of 10 mmHg, the estimated right ventricular systolic pressure is 99991111 mmHg. Left Atrium: Left atrial size was normal in size. Right Atrium: Right atrial size was normal in size. Pericardium: There is no evidence of pericardial effusion. Mitral Valve: The mitral valve is normal in structure. Mild mitral valve regurgitation. Tricuspid Valve: The tricuspid valve is normal in structure. Tricuspid valve regurgitation is trivial. Aortic Valve: The aortic valve is normal in structure. Aortic valve regurgitation is not visualized. Aortic valve mean gradient measures 4.0 mmHg. Aortic valve peak gradient measures 6.2 mmHg. Aortic valve area, by VTI measures 2.07 cm. Pulmonic Valve: The pulmonic valve was normal in structure. Pulmonic valve regurgitation is not visualized. Aorta: The aortic root and ascending aorta are structurally normal, with no evidence of dilitation. IAS/Shunts: No atrial level shunt detected by color flow Doppler.  LEFT VENTRICLE PLAX 2D LVIDd:         4.02 cm  Diastology LVIDs:         2.88 cm  LV e' lateral:   7.40 cm/s LV PW:         1.02 cm  LV E/e' lateral: 11.9 LV IVS:        1.33 cm  LV e' medial:    5.11 cm/s LVOT diam:     1.90 cm  LV E/e' medial:  17.2 LV SV:         51 LV SV Index:   24 LVOT Area:     2.84 cm  RIGHT VENTRICLE RV Mid diam:    2.46 cm RV S prime:     14.60 cm/s TAPSE (M-mode): 1.3 cm LEFT ATRIUM             Index       RIGHT ATRIUM           Index LA diam:        3.90 cm 1.84 cm/m  RA Area:     10.80 cm LA Vol (A2C):   58.2 ml 27.53 ml/m RA Volume:   18.80 ml  8.89 ml/m LA Vol (A4C):   50.5 ml  23.89 ml/m LA Biplane Vol: 55.7 ml 26.35 ml/m  AORTIC VALVE                   PULMONIC VALVE AV Area (Vmax):    2.17 cm    PV Vmax:        1.18 m/s AV Area (Vmean):    1.94 cm    PV Peak grad:   5.6 mmHg AV Area (VTI):     2.07 cm    RVOT Peak grad: 4 mmHg AV Vmax:           125.00 cm/s AV Vmean:          90.400 cm/s AV VTI:            0.245 m AV Peak Grad:      6.2 mmHg AV Mean Grad:      4.0 mmHg LVOT Vmax:         95.80 cm/s LVOT Vmean:        61.900 cm/s LVOT VTI:          0.179 m LVOT/AV VTI ratio: 0.73  AORTA Ao Root diam: 2.80 cm MITRAL VALVE                TRICUSPID VALVE MV Area (PHT): 8.72 cm     TR Peak grad:   25.4 mmHg MV Decel Time: 87 msec      TR Vmax:        252.00 cm/s MV E velocity: 87.90 cm/s MV A velocity: 106.00 cm/s  SHUNTS MV E/A ratio:  0.83         Systemic VTI:  0.18 m MV A Prime:    16.5 cm/s    Systemic Diam: 1.90 cm Serafina Royals MD Electronically signed by Serafina Royals MD Signature Date/Time: 08/26/2019/7:44:12 PM    Final      Assessment and Recommendation  54 y.o. female with back pain and tachycardia now controlled without angina chf or infarction 1.no further cardiac diagnostic needed 2. Supportive care of back pain 3.metoprolol for tachycardia 4.call if further questions  Signed, Serafina Royals M.D. FACC

## 2019-08-27 NOTE — Progress Notes (Signed)
PROGRESS NOTE  [name] [name] [id number] DOB: Sep 05, 1965 DOA: 08/26/2019 PCP: [name], MD  Brief History   [name] [name]  is a 54 y.o. Caucasian female with a known history of asthma, bipolar disorder, hypertension and chronic back pain, obstructive sleep apnea on home CPAP, who presented to the emergency room with acute onset of syncope preceded by palpitations without chest pains.  She had subsequent left hand swelling.  She admitted to mild dyspnea but denied any cough or wheezing.  No fever or chills.  No nausea or vomiting or abdominal pain.  No dysuria, oliguria or hematuria or flank pain.  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  Upon presentation to the emergency room, blood pressure was 141/81 with otherwise normal vital signs.  Labs revealed unremarkable BMP and CBC.  Urinalysis came back negative and urine drug screen came back positive for opiates and tricyclics. 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  Two-view chest x-ray showed no acute cardiopulmonary disease.  The patient has been admitted to a progressive bed. Cardiology has been consulted. The patient has also just been noted by nursing to have an infection of her left great toe. She has been placed on oral antibiotics, and X-ray of the foot is unremarkable.   Echocardiogram has been performed and has demonstrated EF of 55-60% with no wall motion abnormalities and normal diastolic parameters for the left ventricle. RV systolic function is normal with normal pulmonary artery systolic pressure.   Cardiology has been consulted.  Consultants  . Cardiology  Procedures  . None  Antibiotics   Anti-infectives (From admission, onward)   Start     Dose/Rate Route Frequency Ordered Stop   08/27/19 1700  vancomycin (VANCOREADY) IVPB 1750 mg/350  mL  Status:  Discontinued     1,750 mg 175 mL/hr over 120 Minutes Intravenous Every 24 hours 08/26/19 1551 08/26/19 1836   08/27/19 1200  cephALEXin (KEFLEX) capsule 500 mg     500 mg Oral Every 6 hours 08/27/19 1140     08/27/19 1000  cephALEXin (KEFLEX) capsule 500 mg  Status:  Discontinued     500 mg Oral Every 12 hours 08/26/19 1836 08/27/19 1140   08/26/19 1600  cefTRIAXone (ROCEPHIN) 2 g in sodium chloride 0.9 % 100 mL IVPB  Status:  Discontinued     2 g 200 mL/hr over 30 Minutes Intravenous Every 24 hours 08/26/19 1536 08/26/19 1836   08/26/19 1545  vancomycin (VANCOREADY) IVPB 2000 mg/400 mL     2,000 mg 200 mL/hr over 120 Minutes Intravenous  Once 08/26/19 1544 08/26/19 2002   08/26/19 1545  vancomycin (VANCOREADY) IVPB 500 mg/100 mL  Status:  Discontinued     500 mg 100 mL/hr over 60 Minutes Intravenous  Once 08/26/19 1544 08/27/19 1138     Subjective  The patient is resting quietly. Patient complains of a severe headache and feeling badly.. No further palpitations.  Objective   Vitals:  Vitals:   08/27/19 1144 08/27/19 1611  BP: 114/78 134/83  Pulse: 74 83  Resp: 18 17  Temp: 97.6 F (36.4 C) 97.9 F (36.6 C)  SpO2: 97% 100%   Exam:  Constitutional:  . The patient is awake, alert, and oriented x 3. Mild distress from headache. Respiratory:  . No increased work of breathing. . No wheezes, rales, or rhonchi . No tactile  fremitus Cardiovascular:  . Regular rate and rhythm . No murmurs, ectopy, or gallups. . No lateral PMI. No thrills. Abdomen:  . Abdomen is soft, non-tender, non-distended . No hernias, masses, or organomegaly . Normoactive bowel sounds.  Musculoskeletal:  . No cyanosis, clubbing, or edema . Left great toe is erythematous. Skin:  . No rashes, lesions, ulcers . palpation of skin: no induration or nodules Neurologic:  . CN 2-12 intact . Sensation all 4 extremities intact Psychiatric:  . Mental status o Mood, affect  appropriate o Orientation to person, place, time  . judgment and insight appear intact   I have personally reviewed the following:   Today's Data  . Vitals, BMP, CBC  Cardiology Data  . EKG . Echocardiogram: Pending.  Scheduled Meds: . busPIRone  15 mg Oral TID  . calcium-vitamin D  1 tablet Oral Q breakfast  . cephALEXin  500 mg Oral Q6H  . DULoxetine  60 mg Oral QHS  . enoxaparin (LOVENOX) injection  40 mg Subcutaneous BID  . escitalopram  10 mg Oral Daily  . estradiol  1 mg Oral QHS  . famotidine  20 mg Oral BID  . gabapentin  600 mg Oral QID  . haloperidol  5 mg Oral QHS  . linaclotide  290 mcg Oral QAC breakfast  . loratadine  10 mg Oral QPM  . meloxicam  15 mg Oral QHS  . metoprolol succinate  25 mg Oral Daily  . morphine  30 mg Oral Q8H  . multivitamin with minerals  1 tablet Oral Daily  . potassium chloride SA  10 mEq Oral Daily  . sodium chloride flush  3 mL Intravenous Q12H  . topiramate  50 mg Oral BID  . traZODone  100 mg Oral QHS   Continuous Infusions: . sodium chloride Stopped (08/26/19 1211)    Active Problems:   Syncope   LOS: 1 day   A & P  Syncope preceded by palpitations: The patient will be admitted to a cardiac progressive unit bed. Cardiology has been consulted. Echocardiogram is pending. Orthostatics will be checked. Serial troponins are negative and flat.  Infection of left great toe: X-ray was unremarkable. Patient has been converted to oral antibiotics. No need for podiatry consult.  Hypertension: Continue Toprol-XL  Depression and anxiety and bipolar disorder: We will continue Lexapro, BuSpar and Haldol.  Chronic back pain: MS Contin will be continued and as needed Percocet.  GERD: We will continue PPI therapy.  I have seen and examined this patient myself. I have spent 32 minutes in her evaluation and care.  DVT prophylaxis: Lovenox CODE STATUS: Full code Family Communication: None available Dispo: The patient is from:  Home It is anticipated that the patient will be discharged to home. Barriers to discharge include completion of work up as well as resolution of the patient's complaints of headache and weakness. Kvion Shapley, DO [facility] Direct contact: see [email or web address]  7PM-7AM contact night coverage as above 08/27/2019, 6:00 PM  LOS: 0 days

## 2019-08-27 NOTE — Consult Note (Signed)
PHARMACY NOTE:  ANTIMICROBIAL RENAL DOSAGE ADJUSTMENT  Current antimicrobial regimen includes a mismatch between antimicrobial dosage and estimated renal function.  As per policy approved by the Pharmacy & Therapeutics and Medical Executive Committees, the antimicrobial dosage will be adjusted accordingly.  Current antimicrobial dosage:  Keflex 500 mg q12H   Indication: Infection of left great toe (SSTI), no osteo.   Renal Function:  Estimated Creatinine Clearance: 95 mL/min (by C-G formula based on SCr of 0.64 mg/dL).     Antimicrobial dosage has been changed to:  Keflex 500 mg q6H   Thank you for allowing pharmacy to be a part of this patient's care.  Oswald Hillock, [facility] 08/27/2019 11:40 AM

## 2019-08-28 ENCOUNTER — Encounter: Payer: 59 | Admitting: Orthotics

## 2019-08-28 DIAGNOSIS — G43009 Migraine without aura, not intractable, without status migrainosus: Secondary | ICD-10-CM

## 2019-08-28 DIAGNOSIS — K219 Gastro-esophageal reflux disease without esophagitis: Secondary | ICD-10-CM

## 2019-08-28 LAB — GLUCOSE, CAPILLARY
Glucose-Capillary: 105 mg/dL — ABNORMAL HIGH (ref 70–99)
Glucose-Capillary: 126 mg/dL — ABNORMAL HIGH (ref 70–99)

## 2019-08-28 MED ORDER — SUMATRIPTAN 20 MG/ACT NA SOLN: 20.0000 mg | NASAL | Status: DC | PRN

## 2019-08-28 MED ORDER — CEPHALEXIN 500 MG PO CAPS: 500.0000 mg | ORAL_CAPSULE | Freq: Four times a day (QID) | ORAL | 0 refills | Status: AC

## 2019-08-28 MED ORDER — SUMATRIPTAN 20 MG/ACT NA SOLN
20.0000 mg | Freq: Once | NASAL | Status: DC
  Filled 2019-08-28: qty 1

## 2019-08-28 NOTE — Progress Notes (Signed)
Discussed discharge instruction with patient, including follow up appointments and medications.  Sent paper work home with patient.  No further questions asked.

## 2019-08-28 NOTE — Discharge Summary (Signed)
Physician Discharge Summary  [name] [name]:[id number] DOB: 02-14-1966 DOA: 08/26/2019  PCP: [name], MD  Admit date: 08/26/2019 Discharge date: 08/28/2019  Recommendations for Outpatient Follow-up:  1. Follow up with PCP in 7-10 days. 2. Follow up with cardiology in 4 weeks.  Discharge Diagnoses: Principal diagnosis is #1 1. Syncope 2. Infection of left great toe 3. Hypertension 4. Depression and anxiety 5. Bipolar disorder 6. Chronic back pain 7. GERD  Discharge Condition: Fair  Disposition: Home  Diet recommendation: Heart healthy  Filed Weights   08/26/19 1511 08/27/19 0352 08/28/19 0216  Weight: 107.2 kg 107.2 kg 107.4 kg    History of present illness:  [name] [name]  is a 54 y.o. Caucasian female with a known history of asthma, bipolar disorder, hypertension and chronic back pain, obstructive sleep apnea on home CPAP, who presented to the emergency room with acute onset of syncope preceded by palpitations without chest pains.  She had subsequent left hand swelling.  She admitted to mild dyspnea but denied any cough or wheezing.  No fever or chills.  No nausea or vomiting or abdominal pain.  No dysuria, oliguria or hematuria or flank pain.  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  Upon presentation to the emergency room, blood pressure was 141/81 with otherwise normal vital signs.  Labs revealed unremarkable BMP and CBC.  Urinalysis came back negative and urine drug screen came back positive for opiates and tricyclics. 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  Two-view chest x-ray showed no acute cardiopulmonary disease.  The patient will be admitted to an observation progressive cardiac unit for further evaluation and management.    Hospital Course:  Echocardiogram has been performed and  has demonstrated EF of 55-60% with no wall motion abnormalities and normal diastolic parameters for the left ventricle. RV systolic function is normal with normal pulmonary artery systolic pressure.   The patient was evaluated by cardiology. He sees not cardiac cause for syncope. He recommended beta blockade for palpitations. The patient had a migraine headache today which was treated with imitrex as she uses at home.  She is discharged to home in fair condition.  Today's assessment: S: The patient is resting comfortably. She complains of a headache. O: Vitals:  Vitals:   08/28/19 0939 08/28/19 1133  BP: 117/83 120/78  Pulse: 80 78  Resp:  17  Temp:  97.8 F (36.6 C)  SpO2:  97%   Exam:  Constitutional:  . The patient is awake, alert, and oriented x 3. Mild distress from headache Respiratory:  . No increased work of breathing. . No wheezes, rales, or rhonchi . No tactile fremitus Cardiovascular:  . Regular rate and rhythm . No murmurs, ectopy, or gallups. . No lateral PMI. No thrills. Abdomen:  . Abdomen is soft, non-tender, non-distended . No hernias, masses, or organomegaly . Normoactive bowel sounds.  Musculoskeletal:  . No cyanosis, clubbing, or edema Skin:  . No rashes, lesions, ulcers . palpation of skin: no induration or nodules Neurologic:  . CN 2-12 intact . Sensation all 4 extremities intact Psychiatric:  . Mental status o Mood, affect appropriate o Orientation to person, place, time  . judgment and insight appear intact   Discharge Instructions  Discharge Instructions    Activity as tolerated - No restrictions   Complete by: As directed    Call  MD for:  persistant nausea and vomiting   Complete by: As directed    Call MD for:  severe uncontrolled pain   Complete by: As directed    Diet - low sodium heart healthy   Complete by: As directed    Discharge instructions   Complete by: As directed    Follow up with PCP in 7-10 days. Follow up with  cardiology in 4 weeks.   Increase activity slowly   Complete by: As directed      Allergies as of 08/28/2019      Reactions   Penicillins Hives, Rash   No anaphylactic or severe cutaneous Sx > 10 yr ago, no additional medical attention required Tolerates Keflex and Rocephin   Tetracyclines & Related Other (See Comments)   Syncope and put her "in a coma"   Tramadol Other (See Comments)   Seizures   Phenazopyridine Other (See Comments)   Unknown   Tetracycline Other (See Comments)   Syncope and "Put me in a coma"   Ciprofloxacin Rash, Itching   Codeine Itching, Rash   Estradiol Rash   Patches broke out the skin      Medication List    TAKE these medications   Aimovig 70 MG/ML Soaj Generic drug: Erenumab-aooe Inject 70 mg into the skin every 30 (thirty) days.   busPIRone 15 MG tablet Commonly known as: BUSPAR Take 15 mg by mouth 3 (three) times daily.   CALCIUM 1200 PO Take 1,200 mg by mouth daily.   cephALEXin 500 MG capsule Commonly known as: KEFLEX Take 1 capsule (500 mg total) by mouth every 6 (six) hours for 6 days.   cyclobenzaprine 10 MG tablet Commonly known as: FLEXERIL Take 10 mg by mouth 2 (two) times daily as needed for muscle spasms.   DULoxetine 60 MG capsule Commonly known as: CYMBALTA TAKE 1 CAPSULE BY MOUTH EVERY DAY What changed:   how much to take  when to take this   escitalopram 10 MG tablet Commonly known as: LEXAPRO Take 10 mg by mouth daily.   estradiol 1 MG tablet Commonly known as: ESTRACE Take 1 tablet (1 mg total) by mouth at bedtime.   fluticasone 50 MCG/ACT nasal spray Commonly known as: FLONASE SPRAY 2 SPRAYS INTO EACH NOSTRIL EVERY DAY What changed: See the new instructions.   gabapentin 600 MG tablet Commonly known as: NEURONTIN Take 600 mg by mouth 4 (four) times daily.   haloperidol 5 MG tablet Commonly known as: HALDOL Take 1 tablet (5 mg total) by mouth at bedtime. For psychosis   levocetirizine 5 MG  tablet Commonly known as: XYZAL TAKE 1 TABLET BY MOUTH EVERY DAY IN THE EVENING   linaclotide 290 MCG Caps capsule Commonly known as: Linzess TAKE 1 CAPSULE EVERY MORNING BEFORE BREAKFAST   meloxicam 15 MG tablet Commonly known as: MOBIC Take 15 mg by mouth at bedtime.   metoprolol succinate 25 MG 24 hr tablet Commonly known as: TOPROL-XL Take 1 tablet (25 mg total) by mouth daily.   morphine 30 MG 12 hr tablet Commonly known as: MS CONTIN Take 30 mg by mouth every 8 (eight) hours.   multivitamin with minerals tablet Take 1 tablet by mouth daily.   Narcan 4 MG/0.1ML Liqd nasal spray kit Generic drug: naloxone Place 1 spray into the nose once.   nystatin powder Commonly known as: MYCOSTATIN/NYSTOP APPLY TO AFFECTED AREA 4 TIMES A DAY   oxyCODONE-acetaminophen 5-325 MG tablet Commonly known as: PERCOCET/ROXICET Take 1 tablet by  mouth every 8 (eight) hours as needed for severe pain.   pantoprazole 40 MG tablet Commonly known as: PROTONIX TAKE 1 TABLET BY MOUTH TWICE A DAY   potassium chloride SA 20 MEQ tablet Commonly known as: Klor-Con M20 TAKE TWO TABLETS BY MOUTH EVERY DAY   ProAir HFA 108 (90 Base) MCG/ACT inhaler Generic drug: albuterol Inhale 2 puffs = 161mg into the lungs every 6 (six) hours as needed for wheezing or shortness of breath. What changed: See the new instructions.   silver sulfADIAZINE 1 % cream Commonly known as: Silvadene Apply 1 application topically daily.   SUMAtriptan 50 MG tablet Commonly known as: IMITREX TAKE 1 TAB EVERY 2 HOURS AS NEEDED FOR MIGRAINE. MAY REPEAT IN 2 HOURS IF PERSISTS OR RECURS What changed: See the new instructions.   topiramate 50 MG tablet Commonly known as: TOPAMAX TAKE 1 TABLET BY MOUTH TWICE A DAY   traZODone 100 MG tablet Commonly known as: DESYREL Take 100 mg by mouth at bedtime.   Xiidra 5 % Soln Generic drug: Lifitegrast Place 1 drop into both eyes 2 (two) times daily.      Allergies   Allergen Reactions  . Penicillins Hives and Rash    No anaphylactic or severe cutaneous Sx > 10 yr ago, no additional medical attention required Tolerates Keflex and Rocephin  . Tetracyclines & Related Other (See Comments)    Syncope and put her "in a coma"  . Tramadol Other (See Comments)    Seizures  . Phenazopyridine Other (See Comments)    Unknown  . Tetracycline Other (See Comments)    Syncope and "Put me in a coma"  . Ciprofloxacin Rash and Itching  . Codeine Itching and Rash  . Estradiol Rash    Patches broke out the skin    The results of significant diagnostics from this hospitalization (including imaging, microbiology, ancillary and laboratory) are listed below for reference.    Significant Diagnostic Studies: DG Chest 2 View  Result Date: 08/26/2019 CLINICAL DATA:  Syncope, headache EXAM: CHEST - 2 VIEW COMPARISON:  11/23/2016 FINDINGS: Frontal and lateral views of the chest demonstrate an unremarkable cardiac silhouette. Stable postsurgical changes from thoracolumbar spinal fusion. ACDF at the cervicothoracic junction. No acute bony abnormalities. No airspace disease, effusion, or pneumothorax. IMPRESSION: 1. No acute intrathoracic process. Electronically Signed   By: MRanda NgoM.D.   On: 08/26/2019 00:20   CT Head Wo Contrast  Result Date: 08/26/2019 CLINICAL DATA:  54year old female with syncope. EXAM: CT HEAD WITHOUT CONTRAST TECHNIQUE: Contiguous axial images were obtained from the base of the skull through the vertex without intravenous contrast. COMPARISON:  Head CT dated 01/18/2015. FINDINGS: Brain: The ventricles and sulci appropriate size for patient's age. The gray-white matter discrimination is preserved. There is no acute intracranial hemorrhage. No mass effect and shift. No extra-axial fluid collection. Vascular: No hyperdense vessel or unexpected calcification. Skull: Normal. Negative for fracture or focal lesion. Sinuses/Orbits: No acute finding. Other:  None IMPRESSION: Unremarkable noncontrast CT of the brain. Electronically Signed   By: AAnner CreteM.D.   On: 08/26/2019 00:31   DG Hand Complete Left  Result Date: 08/26/2019 CLINICAL DATA:  Initial evaluation for acute hand swelling. Possible injury. EXAM: LEFT HAND - COMPLETE 3+ VIEW COMPARISON:  None. FINDINGS: No acute fracture dislocation. Prominent soft tissue swelling present over the dorsum of the hand moderate osteoarthritic changes present throughout the hand, most notable at the first IP and fifth PIP joints. Osseous mineralization normal. IMPRESSION:  1. No acute osseous abnormality. 2. Prominent soft tissue swelling over the eye the dorsal aspect of the hand. 3. 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 Electronically Signed   By: Jeannine Boga M.D.   On: 08/26/2019 04:25   DG Foot Complete Left  Result Date: 08/26/2019 CLINICAL DATA:  Infection of left great toe. EXAM: LEFT FOOT - COMPLETE 3+ VIEW COMPARISON:  05/09/2018 FINDINGS: There is no evidence of fracture or dislocation. No evidence of osteolysis or periostitis. There is no evidence of arthropathy or other focal bone lesion. Fusion bony fusion of the PIP joint of the middle stone is also seen. 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 Old fracture deformity of the proximal phalanx of the 2nd toe is unchanged. No significant changes seen compared to prior exam. IMPRESSION: No radiographic evidence of osteomyelitis or other acute findings. Electronically Signed   By: Marlaine Hind M.D.   On: 08/26/2019 16:36   ECHOCARDIOGRAM COMPLETE  Result Date: 08/26/2019    ECHOCARDIOGRAM REPORT   Patient Name:   ALAYIA MEGGISON Date of Exam: 08/26/2019 Medical Rec #:  [id number]        Height:       64.0 in Accession #:    [id number]       Weight:       240.0 lb Date of Birth:  11-10-1965        BSA:          2.114 m Patient Age:    63 years         BP:           127/81 mmHg Patient Gender: F                 HR:           92 bpm. Exam Location:  [facility] Procedure: 2D Echo, Cardiac Doppler and Color Doppler Indications:     Syncope 780.2 / R55  History:         Patient has no prior history of Echocardiogram examinations.                  Risk Factors:Hypertension. Pre-diabetic.  Sonographer:     Alyse Low Roar Referring Phys:  [id number] [place] Diagnosing Phys: Serafina Royals MD IMPRESSIONS  1. Left ventricular ejection fraction, by estimation, is 55 to 60%. The left ventricle has normal function. The left ventricle has no regional wall motion abnormalities. Left ventricular diastolic parameters were normal.  2. Right ventricular systolic function is normal. The right ventricular size is normal. There is normal pulmonary artery systolic pressure.  3. The mitral valve is normal in structure. Mild mitral valve regurgitation.  4. The aortic valve is normal in structure. Aortic valve regurgitation is not visualized. FINDINGS  Left Ventricle: Left ventricular ejection fraction, by estimation, is 55 to 60%. The left ventricle has normal function. The left ventricle has no regional wall motion abnormalities. The left ventricular internal cavity size was normal in size. There is  no left ventricular hypertrophy. Left ventricular diastolic parameters were normal. Right Ventricle: The right ventricular size is normal. No increase in right ventricular wall thickness. Right ventricular systolic function is normal. There is normal pulmonary artery systolic pressure. The tricuspid regurgitant velocity is 2.52 m/s, and  with an assumed right atrial pressure of 10 mmHg, the estimated right ventricular systolic pressure is 07.8 mmHg. Left Atrium: Left atrial size was normal in size. Right Atrium: Right atrial size was normal  in size. Pericardium: There is no evidence of pericardial effusion. Mitral Valve: The mitral valve is normal in structure. Mild mitral valve regurgitation. Tricuspid Valve: The tricuspid valve is normal in structure.  Tricuspid valve regurgitation is trivial. Aortic Valve: The aortic valve is normal in structure. Aortic valve regurgitation is not visualized. Aortic valve mean gradient measures 4.0 mmHg. Aortic valve peak gradient measures 6.2 mmHg. Aortic valve area, by VTI measures 2.07 cm. Pulmonic Valve: The pulmonic valve was normal in structure. Pulmonic valve regurgitation is not visualized. Aorta: The aortic root and ascending aorta are structurally normal, with no evidence of dilitation. IAS/Shunts: No atrial level shunt detected by color flow Doppler.  LEFT VENTRICLE PLAX 2D LVIDd:         4.02 cm  Diastology LVIDs:         2.88 cm  LV e' lateral:   7.40 cm/s LV PW:         1.02 cm  LV E/e' lateral: 11.9 LV IVS:        1.33 cm  LV e' medial:    5.11 cm/s LVOT diam:     1.90 cm  LV E/e' medial:  17.2 LV SV:         51 LV SV Index:   24 LVOT Area:     2.84 cm  RIGHT VENTRICLE RV Mid diam:    2.46 cm RV S prime:     14.60 cm/s TAPSE (M-mode): 1.3 cm LEFT ATRIUM             Index       RIGHT ATRIUM           Index LA diam:        3.90 cm 1.84 cm/m  RA Area:     10.80 cm LA Vol (A2C):   58.2 ml 27.53 ml/m RA Volume:   18.80 ml  8.89 ml/m LA Vol (A4C):   50.5 ml 23.89 ml/m LA Biplane Vol: 55.7 ml 26.35 ml/m  AORTIC VALVE                   PULMONIC VALVE AV Area (Vmax):    2.17 cm    PV Vmax:        1.18 m/s AV Area (Vmean):   1.94 cm    PV Peak grad:   5.6 mmHg AV Area (VTI):     2.07 cm    RVOT Peak grad: 4 mmHg AV Vmax:           125.00 cm/s AV Vmean:          90.400 cm/s AV VTI:            0.245 m AV Peak Grad:      6.2 mmHg AV Mean Grad:      4.0 mmHg LVOT Vmax:         95.80 cm/s LVOT Vmean:        61.900 cm/s LVOT VTI:          0.179 m LVOT/AV VTI ratio: 0.73  AORTA Ao Root diam: 2.80 cm MITRAL VALVE                TRICUSPID VALVE MV Area (PHT): 8.72 cm     TR Peak grad:   25.4 mmHg MV Decel Time: 87 msec      TR Vmax:        252.00 cm/s MV E velocity: 87.90 cm/s MV A velocity: 106.00 cm/s  SHUNTS MV E/A  ratio:  0.83         Systemic VTI:  0.18 m MV A Prime:    16.5 cm/s    Systemic Diam: 1.90 cm Serafina Royals MD Electronically signed by Serafina Royals MD Signature Date/Time: 08/26/2019/7:44:12 PM    Final     Microbiology: Recent Results (from the past 240 hour(s))  Respiratory Panel by RT PCR (Flu A&B, Covid) - Nasopharyngeal Swab     Status: None   Collection Time: 08/26/19  9:45 PM   Specimen: Nasopharyngeal Swab  Result Value Ref Range Status   SARS Coronavirus 2 by RT PCR NEGATIVE NEGATIVE Final    Comment: (NOTE) SARS-CoV-2 target nucleic acids are NOT DETECTED. The SARS-CoV-2 RNA is generally detectable in upper respiratoy specimens during the acute phase of infection. The lowest concentration of SARS-CoV-2 viral copies this assay can detect is 131 copies/mL. A negative result does not preclude SARS-Cov-2 infection and should not be used as the sole basis for treatment or other patient management decisions. A negative result may occur with  improper specimen collection/handling, submission of specimen other than nasopharyngeal swab, presence of viral mutation(s) within the areas targeted by this assay, and inadequate number of viral copies (<131 copies/mL). A negative result must be combined with clinical observations, patient history, and epidemiological information. The expected result is Negative. Fact Sheet for Patients:  PinkCheek.be Fact Sheet for Healthcare Providers:  GravelBags.it This test is not yet ap proved or cleared by the [place] FDA and  has been authorized for detection and/or diagnosis of SARS-CoV-2 by FDA under an Emergency Use Authorization (EUA). This EUA will remain  in effect (meaning this test can be used) for the duration of the COVID-19 declaration under Section 564(b)(1) of the Act, 21 U.S.C. section 360bbb-3(b)(1), unless the authorization is terminated or revoked sooner.     Influenza A by PCR NEGATIVE NEGATIVE Final   Influenza B by PCR NEGATIVE NEGATIVE Final    Comment: (NOTE) The Xpert Xpress SARS-CoV-2/FLU/RSV assay is intended as an aid in  the diagnosis of influenza from Nasopharyngeal swab specimens and  should not be used as a sole basis for treatment. Nasal washings and  aspirates are unacceptable for Xpert Xpress SARS-CoV-2/FLU/RSV  testing. Fact Sheet for Patients: PinkCheek.be Fact Sheet for Healthcare Providers: GravelBags.it This test is not yet approved or cleared by the [place] FDA and  has been authorized for detection and/or diagnosis of SARS-CoV-2 by  FDA under an Emergency Use Authorization (EUA). This EUA will remain  in effect (meaning this test can be used) for the duration of the  Covid-19 declaration under Section 564(b)(1) of the Act, 21  U.S.C. section 360bbb-3(b)(1), unless the authorization is  terminated or revoked. Performed at [facility], [address] [place], [place] [address]      Labs: Basic Metabolic Panel: Recent Labs  Lab 08/25/19 2102 08/26/19 0656 08/27/19 0540  NA 137 139 140  K 3.6 3.8 4.3  CL 103 106 104  CO2 _0 GLUCOSE 121* 97 101*  BUN _1 CREATININE 0.56 0.61 0.64  CALCIUM 8.9 8.5* 9.1   Liver Function Tests: No results for input(s): AST, ALT, ALKPHOS, BILITOT, PROT, ALBUMIN in the last 168 hours. No results for input(s): LIPASE, AMYLASE in the last 168 hours. No results for input(s): AMMONIA in the last 168 hours. CBC: Recent Labs  Lab 08/25/19 2102 08/26/19 0656 08/27/19 0540  WBC 7.3 8.3 5.4  HGB 12.6 11.6* 12.4  HCT 38.4 35.4* 38.9  MCV 94.6 94.9 97.3  PLT 329 294 303   Cardiac Enzymes: No results for input(s): CKTOTAL, CKMB, CKMBINDEX, TROPONINI in the last 168 hours. BNP: BNP (last 3 results) No results for input(s): BNP in the last 8760 hours.  ProBNP (last 3 results) No results for  input(s): PROBNP in the last 8760 hours.  CBG: Recent Labs  Lab 08/26/19 0655 08/27/19 0821 08/28/19 0548 08/28/19 0720  GLUCAP 87 108* 105* 126*    Active Problems:   Syncope   Time coordinating discharge: 38 minutes.  Signed:        Zamia Tyminski, DO [facility]  08/28/2019, 4:37 PM

## 2019-08-30 ENCOUNTER — Other Ambulatory Visit: Payer: Self-pay | Admitting: Family Medicine

## 2019-09-03 ENCOUNTER — Other Ambulatory Visit: Payer: Self-pay

## 2019-09-03 ENCOUNTER — Ambulatory Visit: Payer: 59 | Admitting: Orthotics

## 2019-09-03 ENCOUNTER — Ambulatory Visit (INDEPENDENT_AMBULATORY_CARE_PROVIDER_SITE_OTHER): Payer: 59 | Admitting: Podiatry

## 2019-09-03 DIAGNOSIS — L03032 Cellulitis of left toe: Secondary | ICD-10-CM

## 2019-09-03 DIAGNOSIS — L600 Ingrowing nail: Secondary | ICD-10-CM

## 2019-09-03 DIAGNOSIS — L97511 Non-pressure chronic ulcer of other part of right foot limited to breakdown of skin: Secondary | ICD-10-CM

## 2019-09-03 MED ORDER — SULFAMETHOXAZOLE-TRIMETHOPRIM 800-160 MG PO TABS
1.0000 | ORAL_TABLET | Freq: Two times a day (BID) | ORAL | 0 refills | Status: DC
Start: 2019-09-03 — End: 2020-02-08

## 2019-09-03 MED ORDER — MUPIROCIN CALCIUM 2 % EX CREA: 1.0000 "application " | TOPICAL_CREAM | Freq: Two times a day (BID) | CUTANEOUS | 0 refills | Status: DC

## 2019-09-03 NOTE — Progress Notes (Signed)
Sending toe filler and shoe back to [facility] for better fit.

## 2019-09-04 ENCOUNTER — Encounter: Payer: Self-pay | Admitting: Emergency Medicine

## 2019-09-04 ENCOUNTER — Other Ambulatory Visit: Payer: Self-pay

## 2019-09-04 ENCOUNTER — Emergency Department: Payer: 59

## 2019-09-04 ENCOUNTER — Emergency Department
Admission: EM | Admit: 2019-09-04 | Discharge: 2019-09-05 | Disposition: A | Discharge status: Home / Self Care | Payer: 59 | Attending: Emergency Medicine | Admitting: Emergency Medicine

## 2019-09-04 DIAGNOSIS — M545 Low back pain: Secondary | ICD-10-CM | POA: Diagnosis not present

## 2019-09-04 DIAGNOSIS — Y929 Unspecified place or not applicable: Secondary | ICD-10-CM | POA: Diagnosis not present

## 2019-09-04 DIAGNOSIS — Y999 Unspecified external cause status: Secondary | ICD-10-CM | POA: Insufficient documentation

## 2019-09-04 DIAGNOSIS — M546 Pain in thoracic spine: Secondary | ICD-10-CM | POA: Diagnosis not present

## 2019-09-04 DIAGNOSIS — Y9389 Activity, other specified: Secondary | ICD-10-CM | POA: Diagnosis not present

## 2019-09-04 DIAGNOSIS — M5137 Other intervertebral disc degeneration, lumbosacral region: Secondary | ICD-10-CM | POA: Insufficient documentation

## 2019-09-04 DIAGNOSIS — M48061 Spinal stenosis, lumbar region without neurogenic claudication: Secondary | ICD-10-CM | POA: Insufficient documentation

## 2019-09-04 DIAGNOSIS — M25552 Pain in left hip: Secondary | ICD-10-CM | POA: Diagnosis not present

## 2019-09-04 DIAGNOSIS — W19XXXA Unspecified fall, initial encounter: Secondary | ICD-10-CM

## 2019-09-04 DIAGNOSIS — W1839XA Other fall on same level, initial encounter: Secondary | ICD-10-CM | POA: Insufficient documentation

## 2019-09-04 DIAGNOSIS — M25551 Pain in right hip: Secondary | ICD-10-CM | POA: Insufficient documentation

## 2019-09-04 MED ORDER — HALOPERIDOL LACTATE 5 MG/ML IJ SOLN
5.0000 mg | Freq: Once | INTRAMUSCULAR | Status: AC
  Administered 2019-09-04: 5 mg via INTRAVENOUS
  Filled 2019-09-04: qty 1

## 2019-09-04 MED ORDER — METOPROLOL SUCCINATE ER 25 MG PO TB24: 25.0000 mg | ORAL_TABLET | Freq: Every day | ORAL | 0 refills | Status: DC

## 2019-09-04 MED ORDER — OXYCODONE-ACETAMINOPHEN 5-325 MG PO TABS
2.0000 | ORAL_TABLET | Freq: Once | ORAL | Status: AC
  Administered 2019-09-04: 2 via ORAL
  Filled 2019-09-04: qty 2

## 2019-09-04 MED ORDER — HYDROMORPHONE HCL 1 MG/ML IJ SOLN
1.0000 mg | Freq: Once | INTRAMUSCULAR | Status: AC
  Administered 2019-09-04: 1 mg via INTRAVENOUS
  Filled 2019-09-04: qty 1

## 2019-09-04 NOTE — ED Provider Notes (Signed)
[facility] Emergency Department Provider Note   ____________________________________________   I have reviewed the triage vital signs and the nursing notes.   HISTORY  Chief Complaint Fall, Back Pain, and Hip Pain   History limited by: Not Limited   HPI [name] [name] is a 54 y.o. female who presents to the emergency department today because of concern for back and bilateral hip pain.  Patient states that she suffered a fall.  She states she lost her balance and fell back.  Her back hit the hardwood floor.  She denies hitting her head or loss of consciousness.  She denies any feeling of passing out before the fall.  She was recently in the hospital for a syncopal episode.  She states that she has chronic back pain and has had multiple surgeries.   Records reviewed. Per medical record review patient has a history of chronic pain syndrome.   Past Medical History:  Diagnosis Date  . Anxiety   . Asthma    mild intermittent  . Bipolar 1 disorder ([place])    ect treatments last treatment Sep 02 1011  . Depression   . GERD (gastroesophageal reflux disease)   . Hypertension   . Pre-diabetes   . Seizures ([place])    last seizure was so long ago she can't remember.   . Sleep apnea    wears CPAP, uncertain of setting    Patient Active Problem List   Diagnosis Date Noted  . Syncope 08/26/2019  . Non-healing open wound of toe 05/15/2019  . Osteomyelitis ([place]) 05/15/2019  . Palpitations 10/26/2018  . Foot ulcer ([place]) 01/31/2018  . Hyperglycemia 01/31/2018  . Left leg cellulitis 09/24/2017  . Cellulitis 09/24/2017  . Syncope and collapse   . Near syncope 11/23/2016  . Abdominal pain 09/23/2016  . Anxiety 09/23/2016  . 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[place]) 09/23/2016  . Constipation 09/23/2016  . DDD (degenerative disc disease), lumbosacral 09/23/2016  . Migraines 09/23/2016  . Myelopathy ([place]) 09/23/2016  . Nausea 09/23/2016  . 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 Pseudoarthrosis of lumbar spine 09/23/2016  . Seizures ([place]) 09/23/2016  . Shortness of breath 09/23/2016  . Sleep apnea 09/23/2016  . Spinal stenosis of lumbar region 09/23/2016  . Vertigo 09/23/2016  . 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 Chronic pain syndrome   . Esophageal reflux   . Depression   . Rash 11/14/2015  . Toe ulcer, right ([place]) 04/11/2015  . Cellulitis of toe of right foot 04/11/2015  . Nonspecific chest pain   . Chest pain 10/26/2014  . Obesity (BMI 30-39.9) 10/26/2014  . Hypokalemia 10/26/2014  . Manic bipolar I disorder ([place]) 10/26/2014  . Atypical angina ([place]) 10/26/2014  . Tardive dyskinesia 08/26/2011  . Manic bipolar I disorder with rapid cycling ([place]) 05/18/2011    Class: Acute  . Asthma 02/24/2011  . Essential hypertension 02/24/2011  . History of migraine headaches 02/24/2011    Past Surgical History:  Procedure Laterality Date  . ABDOMINAL HYSTERECTOMY    . AMPUTATION TOE Left 02/02/2018   Procedure: AMPUTATION TOE Left 4th toe;  Surgeon: [name], DPM;  Location: [facility];  Service: Podiatry;  Laterality: Left;  . BACK SURGERY    . CARPAL TUNNEL RELEASE     x2  . Laproscopic knee surgery    . NECK SURGERY  03/15/2017  . PLANTAR FASCIA RELEASE     x2  . 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 Authorizing Provider  busPIRone (BUSPAR) 15 MG tablet Take 15 mg by mouth 3 (three) times daily.  04/26/17   [provider]  Calcium Carbonate-Vit D-Min (CALCIUM 1200 PO) Take 1,200 mg by mouth daily.     [provider]  cyclobenzaprine (FLEXERIL) 10 MG tablet Take 10 mg by mouth 2 (two) times daily as needed for muscle spasms.     [provider]  DULoxetine (CYMBALTA) 60 MG capsule TAKE 1  CAPSULE BY MOUTH EVERY DAY Patient taking differently: Take 60 mg by mouth at bedtime.  04/11/19   [name], MD  Erenumab-aooe (AIMOVIG) 70 MG/ML SOAJ Inject 70 mg into the skin every 30 (thirty) days. 02/12/19   [name], MD  escitalopram (LEXAPRO) 10 MG tablet Take 10 mg by mouth daily. 08/23/19   [provider]  estradiol (ESTRACE) 1 MG tablet Take 1 tablet (1 mg total) by mouth at bedtime. 08/16/19   [name], MD  fluticasone (FLONASE) 50 MCG/ACT nasal spray SPRAY 2 SPRAYS INTO EACH NOSTRIL EVERY DAY Patient taking differently: Place 2 sprays into both nostrils daily as needed.  12/11/18   [name], MD  gabapentin (NEURONTIN) 600 MG tablet Take 600 mg by mouth 4 (four) times daily.    [provider]  haloperidol (HALDOL) 5 MG tablet Take 1 tablet (5 mg total) by mouth at bedtime. For psychosis 11/15/15   Barton Dubois, MD  levocetirizine (XYZAL) 5 MG tablet TAKE 1 TABLET BY MOUTH EVERY DAY IN THE EVENING 07/19/19   [name], MD  linaclotide [facility]) 290 MCG CAPS capsule TAKE 1 CAPSULE EVERY MORNING BEFORE BREAKFAST 12/18/18   [name], MD  meloxicam (MOBIC) 15 MG tablet Take 15 mg by mouth at bedtime.  09/15/17   [provider]  metoprolol succinate (TOPROL-XL) 25 MG 24 hr tablet Take 1 tablet (25 mg total) by mouth daily. 05/03/19   [name], MD  morphine (MS CONTIN) 30 MG 12 hr tablet Take 30 mg by mouth every 8 (eight) hours.  05/01/18   [provider]  Multiple Vitamins-Minerals (MULTIVITAMIN WITH MINERALS) tablet Take 1 tablet by mouth daily.    [provider]  mupirocin cream (BACTROBAN) 2 % Apply 1 application topically 2 (two) times daily. 09/03/19   [name], DPM  NARCAN 4 MG/0.1ML LIQD nasal spray kit Place 1 spray into the nose once.  12/19/18   [provider]  nystatin (MYCOSTATIN/NYSTOP) powder APPLY TO AFFECTED AREA 4 TIMES A DAY 06/25/19   [name], MD   oxyCODONE-acetaminophen (PERCOCET/ROXICET) 5-325 MG tablet Take 1 tablet by mouth every 8 (eight) hours as needed for severe pain. Patient not taking: Reported on 08/26/2019 07/19/19   [name], DPM  pantoprazole (PROTONIX) 40 MG tablet TAKE 1 TABLET BY MOUTH TWICE A DAY Patient taking differently: Take 40 mg by mouth 2 (two) times daily.  04/27/19   [name], MD  potassium chloride SA (KLOR-CON M20) 20 MEQ tablet TAKE TWO TABLETS BY MOUTH EVERY DAY 07/30/19   [name], MD  PROAIR HFA 108 [phone] Base) MCG/ACT inhaler Inhale 2 puffs = 116mg into the lungs every 6 (six) hours as needed for wheezing or shortness of breath. Patient taking differently: Inhale 2 puffs into the lungs  every 6 (six) hours as needed for wheezing.  01/10/18   [name], MD  silver sulfADIAZINE (SILVADENE) 1 % cream Apply 1 application topically daily. Patient not taking: Reported on 08/26/2019 05/01/19   [name], DPM  sulfamethoxazole-trimethoprim (BACTRIM DS) 800-160 MG tablet Take 1 tablet by mouth 2 (two) times daily. 09/03/19   [name], DPM  SUMAtriptan (IMITREX) 50 MG tablet TAKE 1 TAB EVERY 2 HOURS AS NEEDED FOR MIGRAINE. MAY REPEAT IN 2 HOURS IF PERSISTS OR RECURS 08/30/19   [name], MD  topiramate (TOPAMAX) 50 MG tablet TAKE 1 TABLET BY MOUTH TWICE A DAY 06/05/19   [name], MD  traZODone (DESYREL) 100 MG tablet Take 100 mg by mouth at bedtime. 07/29/14   [provider]  XIIDRA 5 % SOLN Place 1 drop into both eyes 2 (two) times daily.  10/20/18   [provider]    Allergies Penicillins, Tetracyclines & related, Tramadol, Phenazopyridine, Tetracycline, Ciprofloxacin, Codeine, and Estradiol  Family History  Problem Relation Age of Onset  . Diabetes Mother   . COPD Mother   . Hypertension Mother   . Hyperlipidemia Mother   . Heart disease Father   . Hyperlipidemia Father   . Hypertension Father   . Cancer Father   . Heart disease  Brother   . Heart disease Daughter   . Heart disease Maternal Grandmother   . Hypertension Maternal Grandmother   . Heart disease Maternal Grandfather   . Kidney cancer Paternal Grandmother   . Heart disease Paternal Grandfather     Social History Social History   Tobacco Use  . Smoking status: Never Smoker  . Smokeless tobacco: Never Used  Substance Use Topics  . Alcohol use: No  . Drug use: No    Review of Systems Constitutional: No fever/chills Eyes: No visual changes. ENT: No sore throat. Cardiovascular: Denies chest pain. Respiratory: Denies shortness of breath. Gastrointestinal: No abdominal pain.  No nausea, no vomiting.  No diarrhea.   Genitourinary: Negative for dysuria. Musculoskeletal: Positive for back pain and bilateral hip pain. Skin: Negative for rash. Neurological: Negative for headaches, focal weakness or numbness.  ____________________________________________   PHYSICAL EXAM:  VITAL SIGNS: ED Triage Vitals [09/04/19 1635]  Enc Vitals Group     BP 121/79     Pulse Rate (!) 122     Resp (!) 22     Temp 98.4 F (36.9 C)     Temp Source Oral     SpO2 98 %     Weight 236 lb 12.8 oz (107.4 kg)     Height '5\' 4"'$  (1.626 m)     Head Circumference      Peak Flow      Pain Score 9   Constitutional: Alert and oriented.  Eyes: Conjunctivae are normal.  ENT      Head: Normocephalic and atraumatic.      Nose: No congestion/rhinnorhea.      Mouth/Throat: Mucous membranes are moist.      Neck: No stridor. Hematological/Lymphatic/Immunilogical: No cervical lymphadenopathy. Cardiovascular: Normal rate, regular rhythm.  No murmurs, rubs, or gallops.  Respiratory: Normal respiratory effort without tachypnea nor retractions. Breath sounds are clear and equal bilaterally. No wheezes/rales/rhonchi. Gastrointestinal: Soft and non tender. No rebound. No guarding.  Genitourinary: Deferred Musculoskeletal: Right leg in brace.  Neurologic:  Normal speech and  language. No gross focal neurologic deficits are appreciated.  Skin:  Skin is warm, dry and intact. No rash noted.  No lacerations/abrasions or bruising noted to back.  Psychiatric: Mood and affect are normal. Speech and behavior are normal. Patient exhibits appropriate insight and judgment.  ____________________________________________    LABS (pertinent positives/negatives)  None  ____________________________________________   EKG  None  ____________________________________________    RADIOLOGY  Lumbar spine No acute osseous injury, postoperative changes  Thoracic spine No acute osseous injury, postoperative changes  Left hip No acute osseous abnormality  Right hip No acute osseous abnormality  ____________________________________________   PROCEDURES  Procedures  ____________________________________________   INITIAL IMPRESSION / ASSESSMENT AND PLAN / ED COURSE  Pertinent labs & imaging results that were available during my care of the patient were reviewed by me and considered in my medical decision making (see chart for details).   Patient presented to the emergency department today because of concerns for back pain as well as bilateral hip pain after a fall.  On exam patient without any obvious traumatic injuries to her lower back.  X-rays without concerning osseous injury.  I discussed x-ray findings with patient.  Will give medication to try to help with pain relief.  Pain can be controlled I do think would be reasonable for patient be discharged home. She did ask for a refill for her metoprolol.   ____________________________________________   FINAL CLINICAL IMPRESSION(S) / ED DIAGNOSES  Final diagnoses:  Fall, initial encounter  Left hip pain  Right hip pain     Note: This dictation was prepared with Dragon dictation. Any transcriptional errors that result from this process are unintentional     Nance Pear, MD 09/04/19 2240

## 2019-09-04 NOTE — ED Triage Notes (Signed)
Pt presents to ED via [facility] with c/o fall approx 1.5 hrs PTA. Per EMS pt with hx of 5 back surgeries and 2 broken backs. EMS reports pt c/o severe L lower back pain and L hip pain.   20g to R AC 25mcg Fentanyl given en route 4mg  Zofran given en route   Pt reports bilateral hip pain worse on the left, and 9/10 lower back pain at this time.

## 2019-09-04 NOTE — ED Notes (Signed)
Spoke with Xray pt refused imaging due to having to get up to the table and to a chair, xray states once the pt is in a room and on a bed they will come to complete her imaging.

## 2019-09-04 NOTE — Discharge Instructions (Signed)
Please seek medical attention for any high fevers, chest pain, shortness of breath, change in behavior, persistent vomiting, bloody stool or any other new or concerning symptoms.  

## 2019-09-04 NOTE — ED Notes (Signed)
[facility] for imaging received from Dr. Charna Archer, see orders

## 2019-09-05 NOTE — ED Provider Notes (Signed)
-----------------------------------------   1:11 AM on 09/05/2019 -----------------------------------------  Patient states she is feeling better.  We will discharge the patient home with PCP follow-up.   Harvest Dark, MD 09/05/19 0111

## 2019-09-10 ENCOUNTER — Encounter: Payer: Self-pay | Admitting: Podiatry

## 2019-09-10 ENCOUNTER — Ambulatory Visit (INDEPENDENT_AMBULATORY_CARE_PROVIDER_SITE_OTHER): Payer: 59 | Admitting: Podiatry

## 2019-09-10 ENCOUNTER — Other Ambulatory Visit: Payer: Self-pay

## 2019-09-10 DIAGNOSIS — L600 Ingrowing nail: Secondary | ICD-10-CM | POA: Diagnosis not present

## 2019-09-10 DIAGNOSIS — G629 Polyneuropathy, unspecified: Secondary | ICD-10-CM | POA: Diagnosis not present

## 2019-09-10 NOTE — Progress Notes (Signed)
Subjective: 54 year old female presents the office today for concerns of possible infection to left great toe.  She has noticed some redness around the left toe itself.  She said no drainage currently coming from the toenail.  She said no recent treatment is started at 1 week ago.  She has no new concerns. Denies any systemic complaints such as fevers, chills, nausea, vomiting. No acute changes since last appointment, and no other complaints at this time.   Objective: AAO x3, NAD DP/PT pulses palpable bilaterally, CRT less than 3 seconds Right foot status post transmetatarsal amputation which is well-healed.  On the left side there is mild erythema and edema to the left hallux proximal nail fold area.  Mild incurvation of the nail borders.  There is no drainage or pus there is no pain although she does have neuropathy.  There is no open lesions. No pain with calf compression, swelling, warmth, erythema  Assessment: Left hallux erythema ingrown toenail  Plan: -All treatment options discussed with the patient including all alternatives, risks, complications.  -We discussed nail removal however I am concerned about doing this as she has a nonhealing wound on the right big toe which led to amputation.  Due to this I did not remove the nail today.  Prescribed Bactrim.  Wash the toe with soap and water daily and antibiotic ointment on the nail fold itself.  Monitor for signs or symptoms of worsening infection.  Should this occur she will likely have the nail removed. -Patient encouraged to call the office with any questions, concerns, change in symptoms.   [name] DPM

## 2019-09-10 NOTE — Patient Instructions (Signed)
Soak Instructions- HAVE SOMEONE ELSE CHECK THE TEMPERATURE OF THE WATER    THE DAY AFTER THE PROCEDURE  Place 1/4 cup of epsom salts in a quart of warm tap water.  Submerge your foot or feet with outer bandage intact for the initial soak; this will allow the bandage to become moist and wet for easy lift off.  Once you remove your bandage, continue to soak in the solution for 20 minutes.  This soak should be done twice a day.  Next, remove your foot or feet from solution, blot dry the affected area and cover.  You may use a band aid large enough to cover the area or use gauze and tape.  Apply other medications to the area as directed by the doctor such as polysporin neosporin.  IF YOUR SKIN BECOMES IRRITATED WHILE USING THESE INSTRUCTIONS, IT IS OKAY TO SWITCH TO  WHITE VINEGAR AND WATER. Or you may use antibacterial soap and water to keep the toe clean  Monitor for any signs/symptoms of infection. Call the office immediately if any occur or go directly to the emergency room. Call with any questions/concerns.    

## 2019-09-11 NOTE — Progress Notes (Signed)
Subjective: 54 year old female presents the office today for evaluation of left hallux erythema.  She states the toe is doing about the same has not changed.  Denies any drainage or pus.  She has no pain she has significant neuropathy. Denies any systemic complaints such as fevers, chills, nausea, vomiting. No acute changes since last appointment, and no other complaints at this time.   Objective: AAO x3, NAD DP/PT pulses palpable bilaterally, CRT less than 3 seconds There is no erythema of the left hallux and the tissue mostly on the medial aspect of the nail border.  There is no drainage or pus but there was some old dried drainage on the medial nail border.  There is no ascending cellulitis there is no warmth.  There is no fluctuation crepitation.  No other open lesions. No pain with calf compression, swelling, warmth, erythema  Assessment: Ingrown toenail left hallux with continued erythema  Plan: -All treatment options discussed with the patient including all alternatives, risks, complications.  -Despite being on oral antibiotics and cleaning the toe daily she is continue to have erythema is not changed.  Due to this I recommend partial nail avulsion without chemical matricectomy.  She understands risk of this including delayed or nonhealing amputation. -At this time, discussed partial nail removal without chemical matricectomy to the medial due to infection. Risks and complications were discussed with the patient for which they understand and  verbally consent to the procedure. Under sterile conditions a total of 3 mL of a mixture of 2% lidocaine plain and 0.5% Marcaine plain was infiltrated in a hallux block fashion. Once anesthetized, the skin was prepped in sterile fashion.  No tourniquet was used.  Next the medial border of the hallux nail border was sharply excised making sure to remove the entire offending nail border. Once the nail was  Removed, the area was debrided and the underlying  skin was intact. The area was irrigated and hemostasis was obtained.  A dry sterile dressing was applied.  At the conclusion of the procedure there is found to be an immediate capillary refill time to the digit. The patient tolerated the procedure well any complications. Post procedure instructions were discussed the patient for which he verbally understood. Follow-up in one week for nail check or sooner if any problems are to arise. Discussed signs/symptoms of worsening infection and directed to call the office immediately should any occur or go directly to the emergency room. In the meantime, encouraged to call the office with any questions, concerns, changes symptoms. -Finish course of antibiotics -Patient encouraged to call the office with any questions, concerns, change in symptoms.   Return in about 2 weeks (around 09/24/2019).  [name] DPM

## 2019-09-16 ENCOUNTER — Other Ambulatory Visit: Payer: Self-pay | Admitting: Family Medicine

## 2019-09-16 DIAGNOSIS — J328 Other chronic sinusitis: Secondary | ICD-10-CM

## 2019-10-02 ENCOUNTER — Ambulatory Visit: Payer: 59 | Admitting: Podiatry

## 2019-10-09 ENCOUNTER — Other Ambulatory Visit: Payer: Self-pay

## 2019-10-09 ENCOUNTER — Ambulatory Visit (INDEPENDENT_AMBULATORY_CARE_PROVIDER_SITE_OTHER): Payer: 59 | Admitting: Podiatry

## 2019-10-09 DIAGNOSIS — L600 Ingrowing nail: Secondary | ICD-10-CM

## 2019-10-09 DIAGNOSIS — G629 Polyneuropathy, unspecified: Secondary | ICD-10-CM

## 2019-10-09 DIAGNOSIS — R21 Rash and other nonspecific skin eruption: Secondary | ICD-10-CM

## 2019-10-09 MED ORDER — TRIAMCINOLONE ACETONIDE 0.025 % EX OINT
1.0000 | TOPICAL_OINTMENT | Freq: Every day | CUTANEOUS | 0 refills | Status: DC
Start: 2019-10-09 — End: 2020-03-26

## 2019-10-14 NOTE — Progress Notes (Signed)
Subjective: 54 year old female presents the office today for evaluation of left hallux erythema.  She says the toe is doing much better the redness is almost completely resolved.  The nail procedure site is also healed well small amount of scab is still present.  Denies any drainage or pus.  She is a new issue of rash to the lateral aspect of right ankle.  She states she woke up like this is been getting somewhat worse.  No warmth or swelling.  No injury.  No definitive opening which is noticed a red rash. No acute changes since last appointment, and no other complaints at this time.   Objective: AAO x3, NAD DP/PT pulses palpable bilaterally, CRT less than 3 seconds Erythema to the left hallux is mostly resolved.  The nail procedure site is healed a small scab is present.  There is no drainage or pus.  No fluctuation crepitation. Right side status post transmetatarsal rotation which is well-healed.  The lateral aspect ankle is a flat, erythematous rash.  No open lesion. No pain with calf compression, swelling, warmth, erythema  Assessment: Status post left hallux ingrown toenail resolved erythema; right ankle skin rash  Plan: -All treatment options discussed with the patient including all alternatives, risks, complications.  -Left side is healed well.  Recommend continue with antibiotic ointment with continued therapy.  Monitoring signs or symptoms of infection or recurrence. -Triamcinolone cream for the right ankle.  No follow-ups on file.  [name] DPM

## 2019-10-24 ENCOUNTER — Telehealth: Payer: Self-pay | Admitting: Podiatry

## 2019-10-24 NOTE — Telephone Encounter (Signed)
Pt left message on 7.2 asking if shoes were in.Marland Kitchen  Upon looking we are waiting on inserts from [facility] I think. We have the shoes.

## 2019-11-01 ENCOUNTER — Other Ambulatory Visit: Payer: Self-pay

## 2019-11-01 ENCOUNTER — Ambulatory Visit (INDEPENDENT_AMBULATORY_CARE_PROVIDER_SITE_OTHER): Payer: 59 | Admitting: Podiatry

## 2019-11-01 ENCOUNTER — Other Ambulatory Visit: Payer: Self-pay | Admitting: Family Medicine

## 2019-11-01 DIAGNOSIS — Z899 Acquired absence of limb, unspecified: Secondary | ICD-10-CM

## 2019-11-01 DIAGNOSIS — L600 Ingrowing nail: Secondary | ICD-10-CM

## 2019-11-01 DIAGNOSIS — R21 Rash and other nonspecific skin eruption: Secondary | ICD-10-CM

## 2019-11-01 DIAGNOSIS — G629 Polyneuropathy, unspecified: Secondary | ICD-10-CM

## 2019-11-01 MED ORDER — AIMOVIG 70 MG/ML ~~LOC~~ SOAJ: 70.0000 mg | SUBCUTANEOUS | 3 refills | Status: DC

## 2019-11-02 ENCOUNTER — Telehealth: Payer: Self-pay | Admitting: Podiatry

## 2019-11-02 DIAGNOSIS — Z89421 Acquired absence of other right toe(s): Secondary | ICD-10-CM

## 2019-11-02 DIAGNOSIS — Z89411 Acquired absence of right great toe: Secondary | ICD-10-CM

## 2019-11-02 DIAGNOSIS — G629 Polyneuropathy, unspecified: Secondary | ICD-10-CM

## 2019-11-02 NOTE — Telephone Encounter (Signed)
Pt called stating that she wore her orthotics yesterday and they rubbed blisters on her amputation and they are bleeding wants to know what she should do please advise

## 2019-11-02 NOTE — Telephone Encounter (Signed)
Go back to wearing the boot. Clean with soap and water and dry thoroughly. Apply a small amount of antibiotic to the wounds daily. If there is any signs of infection to let me know. Please have her come in to see me.

## 2019-11-05 NOTE — Telephone Encounter (Signed)
I informed pt of Dr. Leigh Aurora 11/02/2019 4:07pm orders and transferred to scheduler.

## 2019-11-07 NOTE — Progress Notes (Signed)
Subjective: 54 year old female presents the office today for evaluation of left hallux erythema/ingrown toenail.  She states this is resolved and she has not had any pain she denies any redness or drainage.  She denies any open sores currently she also presents to pick up shoes, inserts.   Objective: AAO x3, NAD DP/PT pulses palpable bilaterally, CRT less than 3 seconds Status post left hallux nail avulsion which is well-healed and a small scab is still present there is no surrounding erythema, ascending cellulitis but there is no fluctuation crepitation with malodor.  There are no ulcerations bilaterally and the transmetatarsal amputations on the right side is well-healed. No significant rash on the right side No pain with calf compression, swelling, warmth, erythema  Assessment: Healed ulcerations bilaterally  Plan: -All treatment options discussed with the patient including all alternatives, risks, complications.  -Overall doing well she is having no open sores.  Diabetic shoes, inserts were dispensed.  Discussed with her to monitor the feet daily for any blisters, skin irritation or any open sores.  Return in about 4 weeks (around 11/29/2019).  [name] DPM

## 2019-12-03 ENCOUNTER — Ambulatory Visit: Payer: 59 | Admitting: Podiatry

## 2020-01-07 ENCOUNTER — Ambulatory Visit (INDEPENDENT_AMBULATORY_CARE_PROVIDER_SITE_OTHER): Payer: 59

## 2020-01-07 ENCOUNTER — Other Ambulatory Visit: Payer: Self-pay

## 2020-01-07 ENCOUNTER — Ambulatory Visit (INDEPENDENT_AMBULATORY_CARE_PROVIDER_SITE_OTHER): Payer: 59 | Admitting: Podiatry

## 2020-01-07 DIAGNOSIS — G629 Polyneuropathy, unspecified: Secondary | ICD-10-CM | POA: Diagnosis not present

## 2020-01-07 DIAGNOSIS — L84 Corns and callosities: Secondary | ICD-10-CM | POA: Diagnosis not present

## 2020-01-07 DIAGNOSIS — M24573 Contracture, unspecified ankle: Secondary | ICD-10-CM | POA: Diagnosis not present

## 2020-01-07 NOTE — Patient Instructions (Signed)

## 2020-01-09 ENCOUNTER — Telehealth: Payer: Self-pay

## 2020-01-09 NOTE — Telephone Encounter (Signed)
DOS 01/16/2020  GASTROCNEMIUS RECESS RT - [id number]  [employer] MEDICARE EFFECTIVE DATE - 04/20/2019  PLAN DEDUCTIBLE - $198.00 W/ $0.00 REMAINING OUT OF POCKET - $198.00 W/ $0.00 REMAINING COPAY $0.00 COINSURANCE - 20%  Notification or Prior Authorization is not required for the requested services  Decision ID #:[id number]

## 2020-01-11 NOTE — Progress Notes (Signed)
Subjective: 54 year old female presents the office today for evaluation of right foot pain.  She states that she is also developed a "popping" sensation to her right foot.  She has noticed a small callus/blister at the distal, plantar aspect of the right foot that she points to.  No drainage or pus or open sores. Denies any systemic complaints such as fevers, chills, nausea, vomiting. No acute changes since last appointment, and no other complaints at this time.   Objective: AAO x3, NAD DP/PT pulses palpable bilaterally, CRT less than 3 seconds Status post right foot transmetatarsal amputation.  Hyperkeratotic lesion the distal medial plantar aspect of the foot.  No underlying ulceration drainage or signs of infection.  No surrounding erythema.  No area of pinpoint tenderness.  No significant edema to the foot.  Gastrocnemius equinus is present. No pain with calf compression, swelling, warmth, erythema  Assessment: Status post transmetatarsal amputation with equinus  Plan: -All treatment options discussed with the patient including all alternatives, risks, complications.  -X-rays obtained reviewed.  No evidence of acute fracture, osteomyelitis. -Given the equinus as well as the callus I recommended a gastrocnemius recession.  I discussed the surgery as well as postoperative course as well as conservative options and she wants to go and proceed with surgery. -We will plan for right foot gastrocnemius recession. -The incision placement as well as the postoperative course was discussed with the patient. I discussed risks of the surgery which include, but not limited to, infection, bleeding, pain, swelling, need for further surgery, delayed or nonhealing, painful or ugly scar, numbness or sensation changes, over/under correction, recurrence, transfer lesions, further deformity, hardware failure, DVT/PE, loss of toe/foot. Patient understands these risks and wishes to proceed with surgery. The surgical  consent was reviewed with the patient all 3 pages were signed. No promises or guarantees were given to the outcome of the procedure. All questions were answered to the best of my ability. Before the surgery the patient was encouraged to call the office if there is any further questions. The surgery will be performed at the [facility] on an outpatient basis. -Patient encouraged to call the office with any questions, concerns, change in symptoms.   [name] DPM

## 2020-01-14 ENCOUNTER — Telehealth: Payer: Self-pay

## 2020-01-14 ENCOUNTER — Ambulatory Visit: Payer: 59 | Admitting: Orthotics

## 2020-01-14 ENCOUNTER — Other Ambulatory Visit: Payer: Self-pay

## 2020-01-14 DIAGNOSIS — G629 Polyneuropathy, unspecified: Secondary | ICD-10-CM

## 2020-01-14 DIAGNOSIS — M24573 Contracture, unspecified ankle: Secondary | ICD-10-CM

## 2020-01-14 DIAGNOSIS — L84 Corns and callosities: Secondary | ICD-10-CM

## 2020-01-14 DIAGNOSIS — Z899 Acquired absence of limb, unspecified: Secondary | ICD-10-CM

## 2020-01-14 DIAGNOSIS — M216X1 Other acquired deformities of right foot: Secondary | ICD-10-CM | POA: Diagnosis not present

## 2020-01-14 NOTE — Telephone Encounter (Signed)
Patient had an appointment with Liliane Channel today and also picked up her tall , large CAM walker for her surgery on Wednesday. Please add charges Thanks

## 2020-01-14 NOTE — Progress Notes (Signed)
Going to wait until after surgery to address transmet filler/blister.

## 2020-01-16 ENCOUNTER — Other Ambulatory Visit: Payer: Self-pay | Admitting: Family Medicine

## 2020-01-16 ENCOUNTER — Other Ambulatory Visit: Payer: Self-pay | Admitting: Podiatry

## 2020-01-16 ENCOUNTER — Encounter: Payer: Self-pay | Admitting: Podiatry

## 2020-01-16 DIAGNOSIS — M216X1 Other acquired deformities of right foot: Secondary | ICD-10-CM | POA: Diagnosis not present

## 2020-01-16 MED ORDER — CEPHALEXIN 500 MG PO CAPS: 500.0000 mg | ORAL_CAPSULE | Freq: Three times a day (TID) | ORAL | 0 refills | Status: DC

## 2020-01-16 NOTE — Progress Notes (Signed)
Postop antibiotics sent to the pharmacy She has been on keflex previously without issues

## 2020-01-17 ENCOUNTER — Telehealth: Payer: Self-pay | Admitting: *Deleted

## 2020-01-17 NOTE — Telephone Encounter (Signed)
Called and spoke with the patient and the patient is doing fine after having surgery on Wednesday September 29th, 2021 with Dr Jacqualyn Posey and patient stated that there was not any fever or nausea and no chills and was icing and elevating and I stated to call the office if any concerns or questions and that we would see the patient in the office for the post op visit. [name] [name]

## 2020-01-21 ENCOUNTER — Encounter: Payer: 59 | Admitting: Podiatry

## 2020-01-21 IMAGING — CR RIGHT KNEE - COMPLETE 4+ VIEW
4 series · 4 of 4 positions shown · non-contrast
Comparison: None.

CLINICAL DATA: Right knee pain, no known injury, initial encounter

EXAM:
RIGHT KNEE - COMPLETE 4+ VIEW

[w knee ap right]
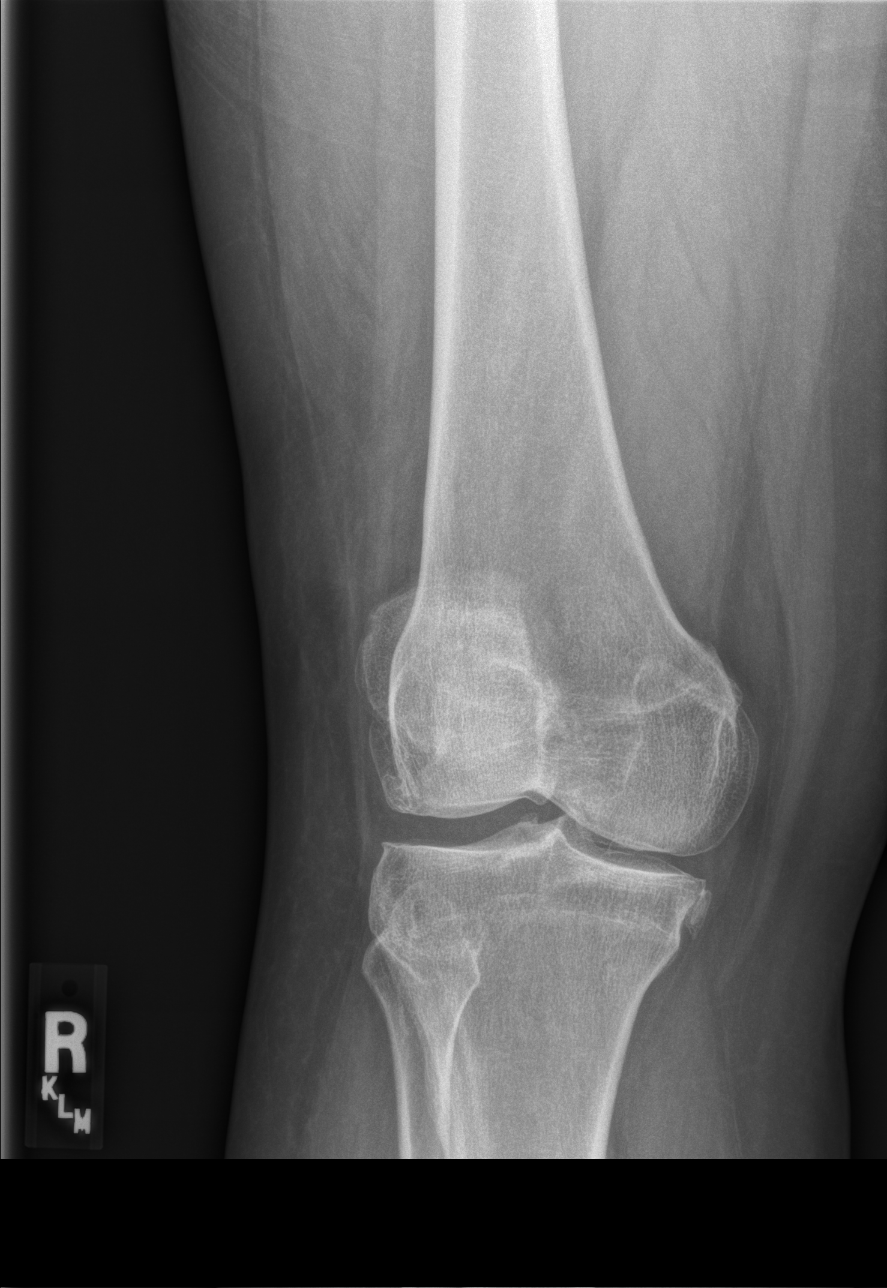

[w knee lat right]
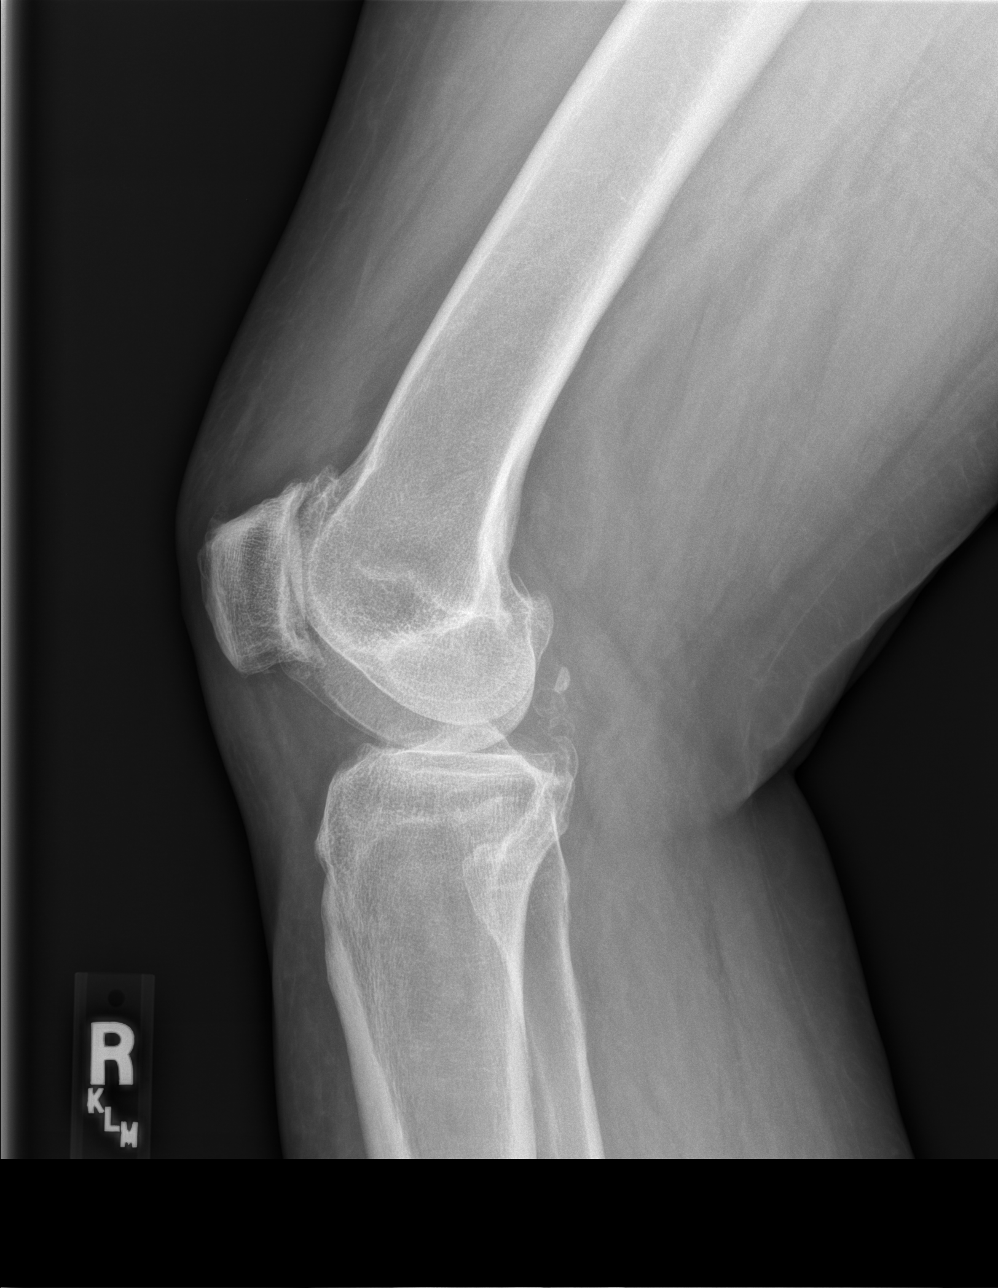

[w knee tunnel pa right]
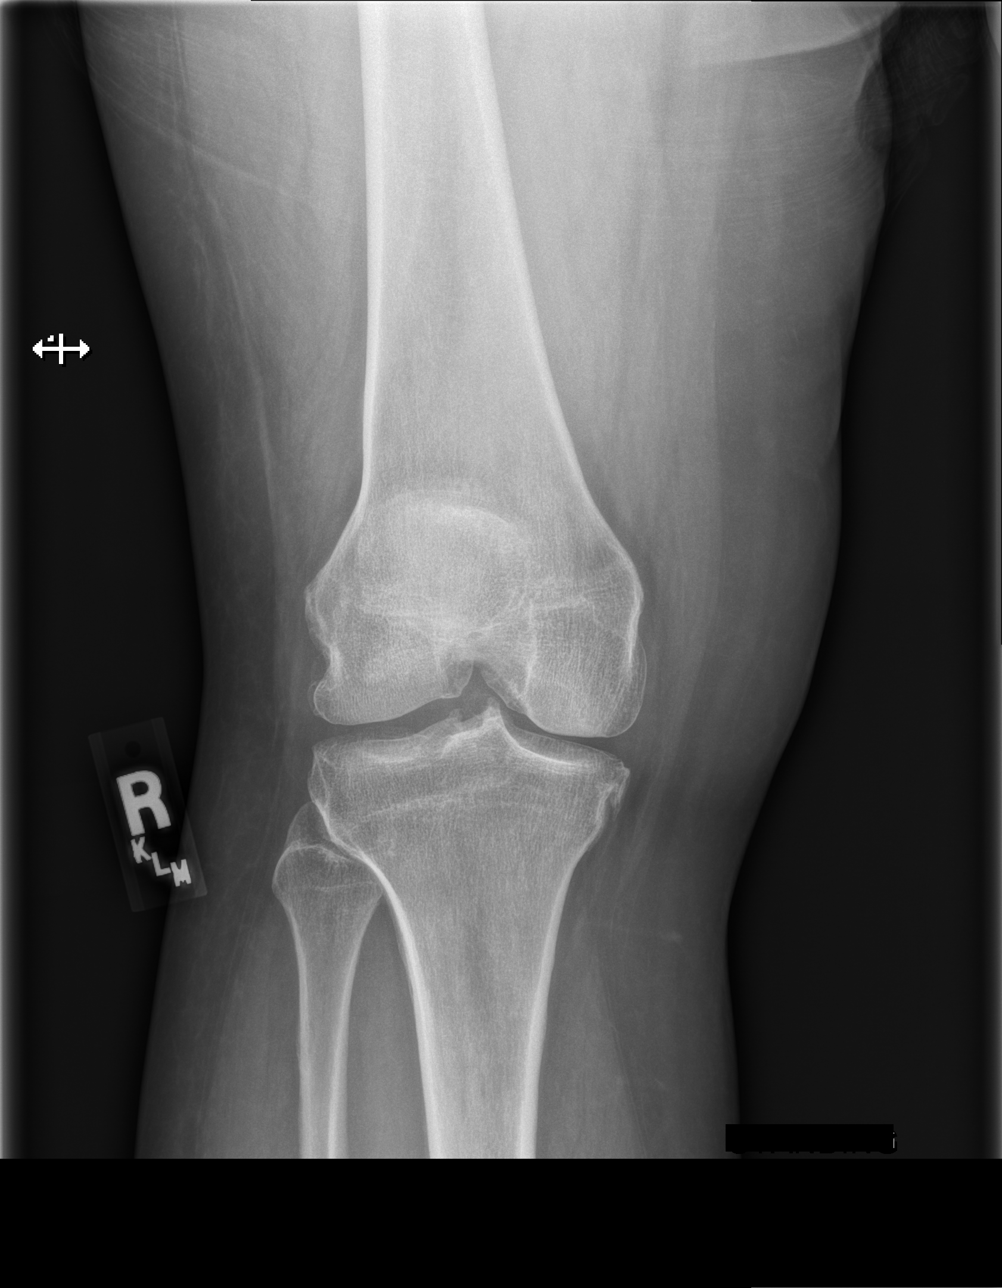

[x knee sunrise right]
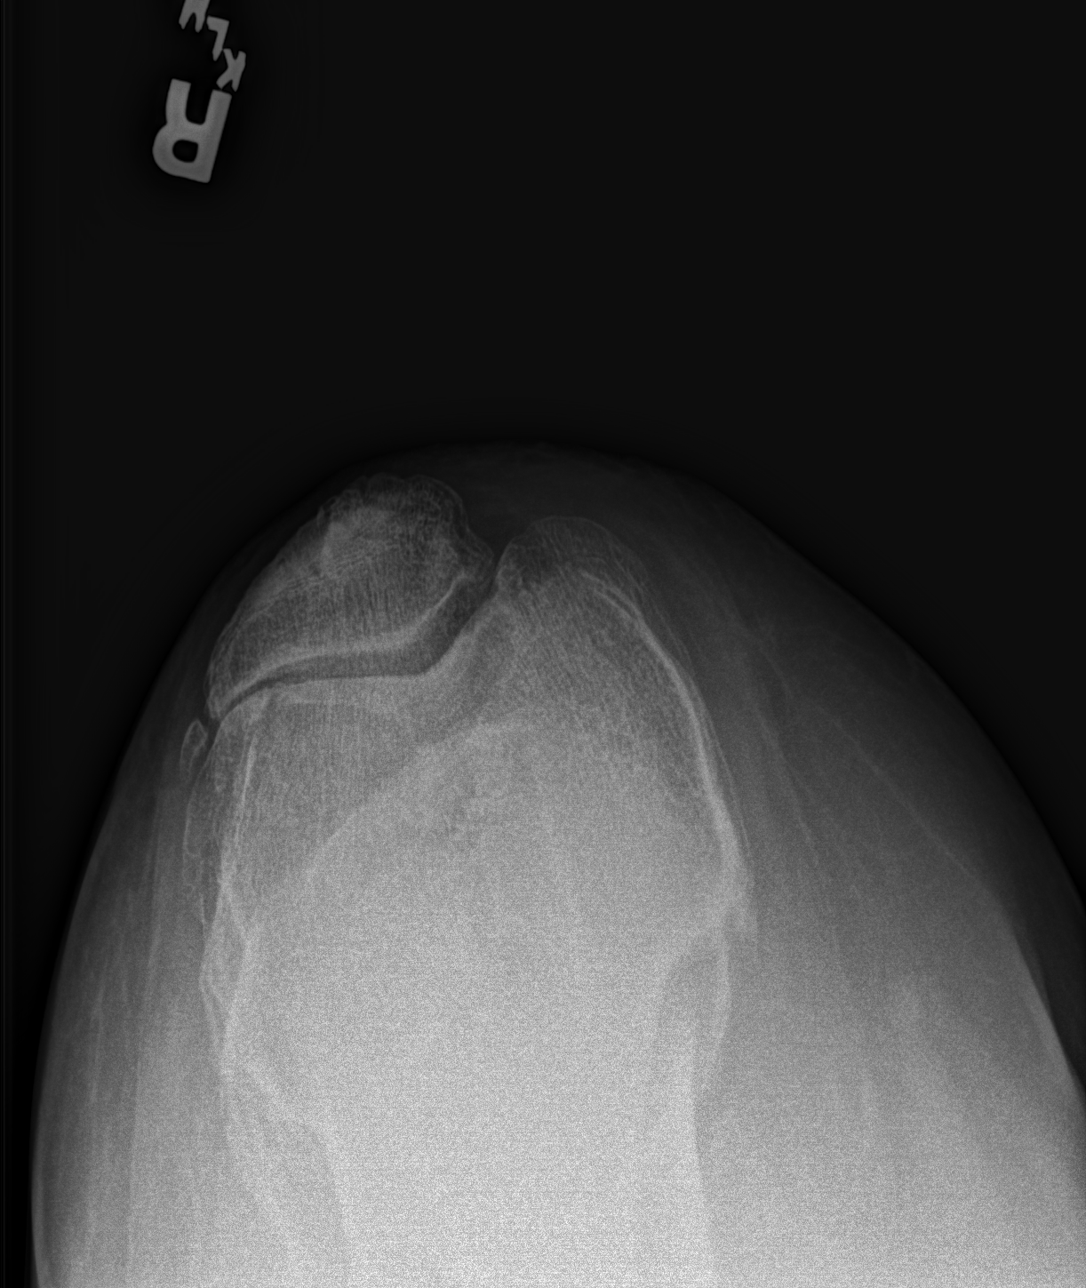

[4 of 4 positions shown; findings below may reference images not displayed]

FINDINGS: Degenerative changes are noted most marked in the medial and
patellofemoral joint spaces. No joint effusion is seen. No acute
fracture or dislocation is noted.
IMPRESSION: Degenerative change without acute abnormality.

## 2020-01-21 IMAGING — CR LEFT KNEE - COMPLETE 4+ VIEW
4 series · 4 of 4 positions shown · non-contrast
Comparison: 09/24/2017

CLINICAL DATA: Knee pain common no known injury, initial encounter

EXAM:
LEFT KNEE - COMPLETE 4+ VIEW

[w knee ap left]
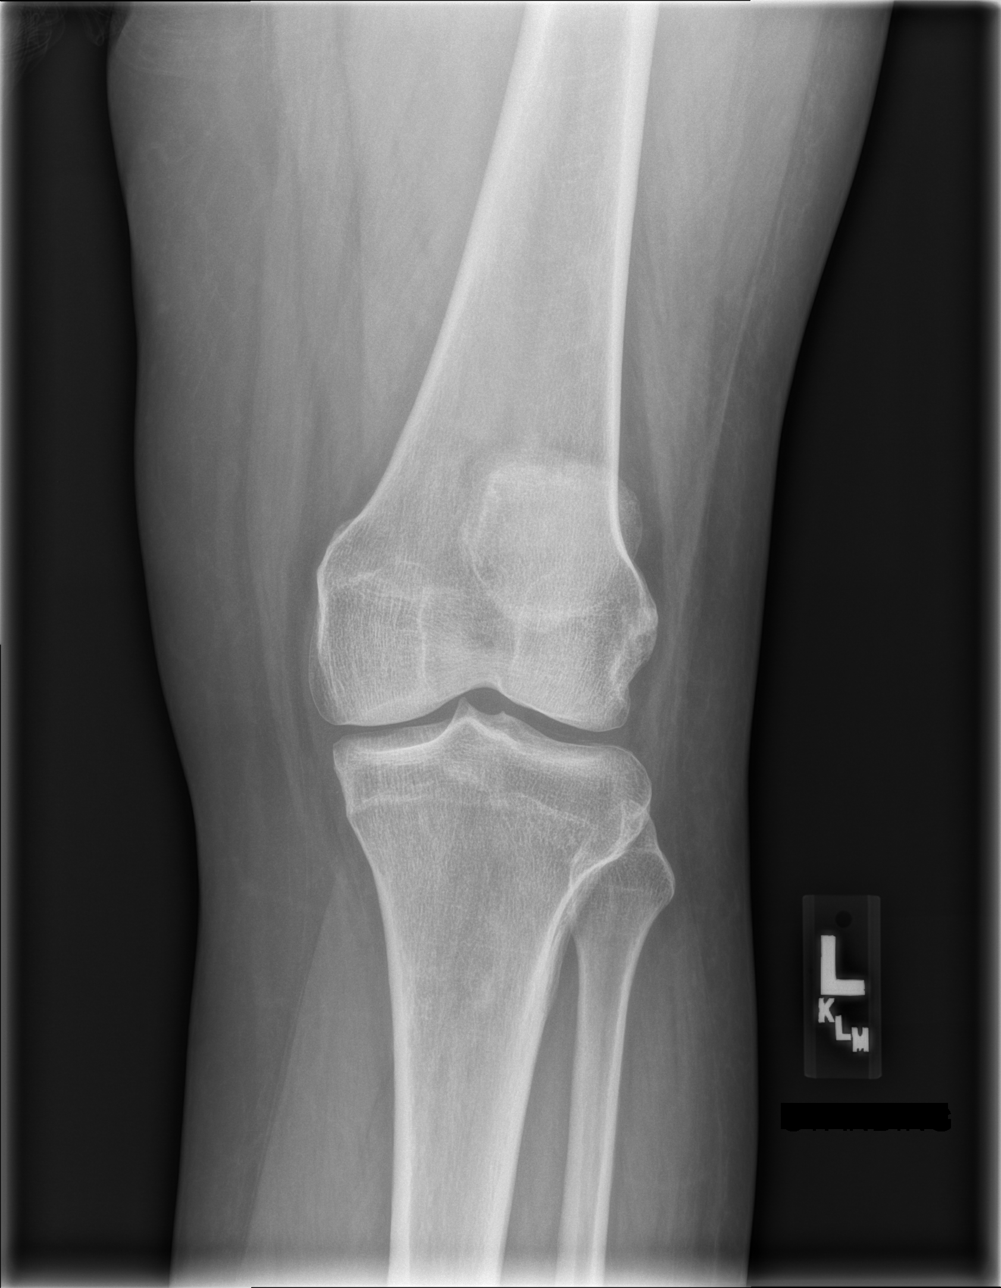

[w knee lat left]
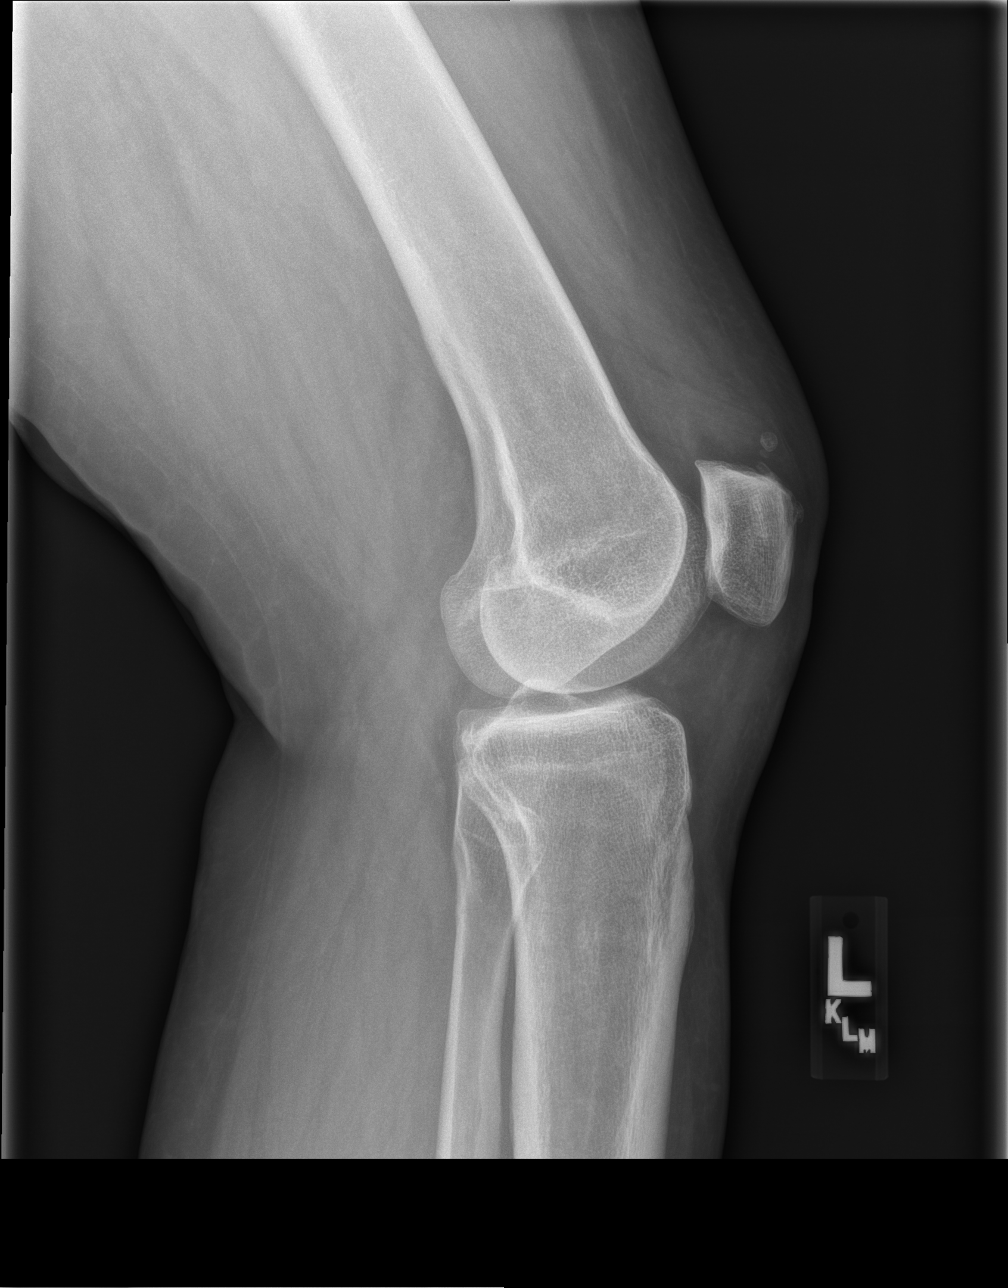

[w knee tunnel pa left]
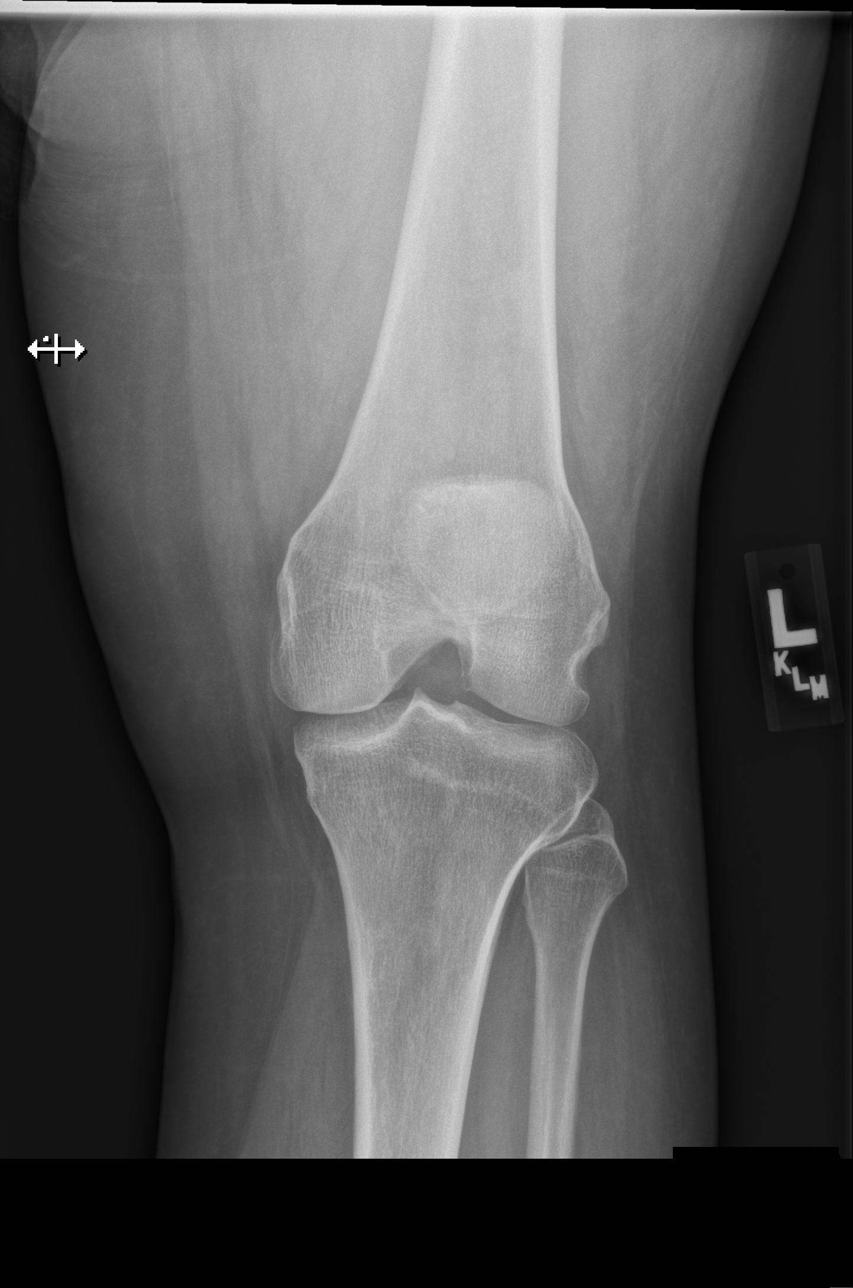

[x knee sunrise left]
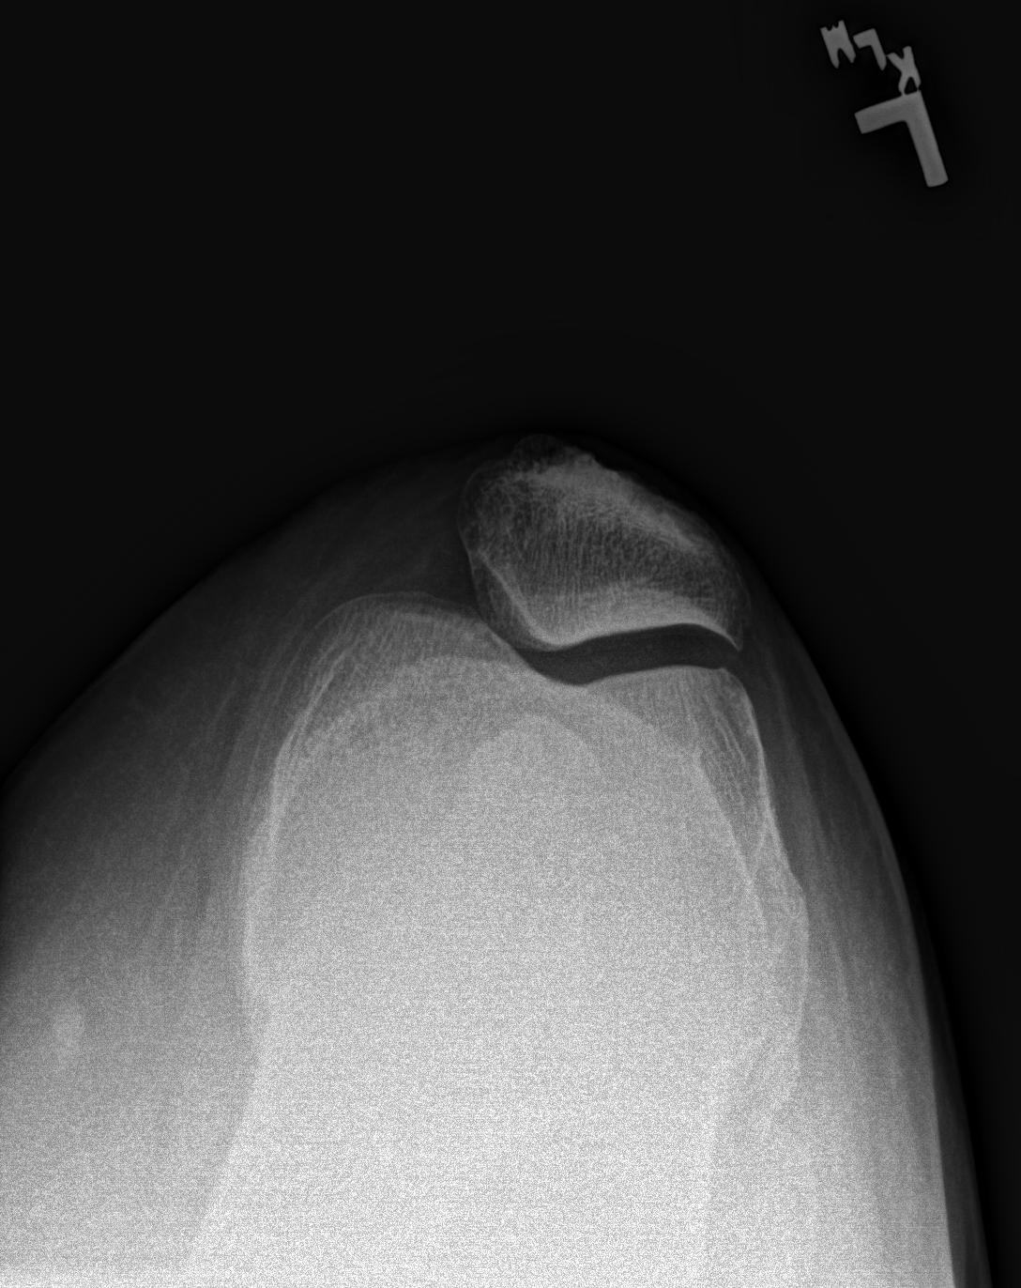

[4 of 4 positions shown; findings below may reference images not displayed]

FINDINGS: No acute fracture or dislocation is noted. No joint effusion is
seen. No soft tissue abnormality is noted.
IMPRESSION: No acute abnormality seen.

## 2020-01-22 ENCOUNTER — Other Ambulatory Visit: Payer: Self-pay

## 2020-01-22 ENCOUNTER — Ambulatory Visit (INDEPENDENT_AMBULATORY_CARE_PROVIDER_SITE_OTHER): Payer: 59 | Admitting: Podiatry

## 2020-01-22 DIAGNOSIS — L84 Corns and callosities: Secondary | ICD-10-CM

## 2020-01-22 DIAGNOSIS — M24573 Contracture, unspecified ankle: Secondary | ICD-10-CM

## 2020-01-23 NOTE — Progress Notes (Signed)
Subjective: AILEENA IGLESIA is a 54 y.o. is seen today in office s/p right foot gastroc recession preformed on 01/16/2020. She is doing well and pain is controlled. She has been wearing the CAM boot and try to stay off the foot is much as possible.  Denies any systemic complaints such as fevers, chills, nausea, vomiting. No calf pain, chest pain, shortness of breath.   Objective: General: No acute distress, AAOx3  DP/PT pulses palpable 2/4, CRT < 3 sec to all digits.  Protective sensation intact. Motor function intact.  RIGHT foot: Incision is well coapted without any evidence of dehiscence with sutures, staples intact. There is no surrounding erythema, ascending cellulitis, fluctuance, crepitus, malodor, drainage/purulence. There is minimal edema around the surgical site. There is no pain along the surgical site.  On the area the transmetatarsal amputation site there is no severe erythema and there is no skin breakdown. No other areas of tenderness to bilateral lower extremities.  No other open lesions or pre-ulcerative lesions.  No pain with calf compression, swelling, warmth, erythema.   Assessment and Plan:  Status post right foot gastrocnemius recession, doing well with no complications   -Treatment options discussed including all alternatives, risks, and complications -Antibiotic ointment in the bandage applied.  Keep dressing clean, dry, intact.  In a cam boot, nonweightbearing. -Ice/elevation -Pain medication as needed. -Monitor for any clinical signs or symptoms of infection and DVT/PE and directed to call the office immediately should any occur or go to the ER. -Follow-up as scheduled or sooner if any problems arise. In the meantime, encouraged to call the office with any questions, concerns, change in symptoms.   Celesta Gentile, DPM

## 2020-02-01 ENCOUNTER — Other Ambulatory Visit: Payer: Self-pay

## 2020-02-01 ENCOUNTER — Ambulatory Visit (INDEPENDENT_AMBULATORY_CARE_PROVIDER_SITE_OTHER): Payer: 59 | Admitting: Podiatry

## 2020-02-01 DIAGNOSIS — M24573 Contracture, unspecified ankle: Secondary | ICD-10-CM

## 2020-02-01 DIAGNOSIS — Z9889 Other specified postprocedural states: Secondary | ICD-10-CM

## 2020-02-05 ENCOUNTER — Encounter: Payer: Self-pay | Admitting: Podiatry

## 2020-02-05 NOTE — Progress Notes (Signed)
Subjective:  Patient ID: [name] [name], female    DOB: October 13, 1965,  MRN: [id number]  Chief Complaint  Patient presents with  . Routine Post Op    PT stated that she is doing pretty good has some pain that comes and goes but has no concerns     Procedure: s/p right foot gastroc recession preformed on 01/16/2020  54 y.o. female returns for post-op check.  Patient states that she is doing well.  She states that the pain comes and goes but nothing acute concern.  She has been ambulating with a cam boot.  She would like to know if the stitches are ready to come out today.  She is known to Dr. Jacqualyn Posey who did the procedure.  Review of Systems: Negative except as noted in the HPI. Denies N/V/F/Ch.  Past Medical History:  Diagnosis Date  . Anxiety   . Asthma    mild intermittent  . Bipolar 1 disorder ([place])    ect treatments last treatment Sep 02 1011  . Depression   . GERD (gastroesophageal reflux disease)   . Hypertension   . Pre-diabetes   . Seizures ([place])    last seizure was so long ago she can't remember.   . Sleep apnea    wears CPAP, uncertain of setting    Current Outpatient Medications:  .  AIMOVIG 70 MG/ML SOAJ, INJECT 70 MG INTO THE SKIN EVERY 30 (THIRTY) DAYS., Disp: 1 pen, Rfl: 0 .  busPIRone (BUSPAR) 15 MG tablet, Take 15 mg by mouth 3 (three) times daily. , Disp: , Rfl: 2 .  Calcium Carbonate-Vit D-Min (CALCIUM 1200 PO), Take 1,200 mg by mouth daily. , Disp: , Rfl:  .  cephALEXin (KEFLEX) 500 MG capsule, Take 1 capsule (500 mg total) by mouth 3 (three) times daily., Disp: 21 capsule, Rfl: 0 .  cyclobenzaprine (FLEXERIL) 10 MG tablet, Take 10 mg by mouth 2 (two) times daily as needed for muscle spasms. , Disp: , Rfl:  .  DULoxetine (CYMBALTA) 60 MG capsule, TAKE 1 CAPSULE BY MOUTH EVERY DAY, Disp: 90 capsule, Rfl: 2 .  escitalopram (LEXAPRO) 10 MG tablet, Take 10 mg by mouth daily., Disp: , Rfl:  .  estradiol (ESTRACE) 1 MG tablet, Take 1 tablet (1 mg total) by mouth  at bedtime., Disp: 90 tablet, Rfl: 3 .  fluticasone (FLONASE) 50 MCG/ACT nasal spray, SPRAY 2 SPRAYS INTO EACH NOSTRIL EVERY DAY, Disp: 48 mL, Rfl: 2 .  gabapentin (NEURONTIN) 600 MG tablet, Take 600 mg by mouth 4 (four) times daily., Disp: , Rfl:  .  haloperidol (HALDOL) 5 MG tablet, Take 1 tablet (5 mg total) by mouth at bedtime. For psychosis, Disp: , Rfl:  .  levocetirizine (XYZAL) 5 MG tablet, TAKE 1 TABLET BY MOUTH EVERY DAY IN THE EVENING, Disp: 90 tablet, Rfl: 1 .  linaclotide (LINZESS) 290 MCG CAPS capsule, TAKE 1 CAPSULE EVERY MORNING BEFORE BREAKFAST, Disp: 90 capsule, Rfl: 3 .  meloxicam (MOBIC) 15 MG tablet, Take 15 mg by mouth at bedtime. , Disp: , Rfl: 2 .  metoprolol succinate (TOPROL-XL) 25 MG 24 hr tablet, Take 1 tablet (25 mg total) by mouth daily., Disp: 90 tablet, Rfl: 0 .  morphine (MS CONTIN) 30 MG 12 hr tablet, Take 30 mg by mouth every 8 (eight) hours. , Disp: , Rfl:  .  Multiple Vitamins-Minerals (MULTIVITAMIN WITH MINERALS) tablet, Take 1 tablet by mouth daily., Disp: , Rfl:  .  mupirocin cream (BACTROBAN) 2 %, Apply 1  application topically 2 (two) times daily., Disp: 15 g, Rfl: 0 .  NARCAN 4 MG/0.1ML LIQD nasal spray kit, Place 1 spray into the nose once. , Disp: , Rfl:  .  nystatin (MYCOSTATIN/NYSTOP) powder, APPLY TO AFFECTED AREA 4 TIMES A DAY, Disp: 60 g, Rfl: 0 .  oxyCODONE-acetaminophen (PERCOCET/ROXICET) 5-325 MG tablet, Take 1 tablet by mouth every 8 (eight) hours as needed for severe pain. (Patient not taking: Reported on 08/26/2019), Disp: 10 tablet, Rfl: 0 .  pantoprazole (PROTONIX) 40 MG tablet, TAKE 1 TABLET BY MOUTH TWICE A DAY (Patient taking differently: Take 40 mg by mouth 2 (two) times daily. ), Disp: 180 tablet, Rfl: 3 .  potassium chloride SA (KLOR-CON M20) 20 MEQ tablet, TAKE TWO TABLETS BY MOUTH EVERY DAY, Disp: 180 tablet, Rfl: 1 .  PROAIR HFA 108 (90 Base) MCG/ACT inhaler, Inhale 2 puffs = 139mg into the lungs every 6 (six) hours as needed for  wheezing or shortness of breath. (Patient taking differently: Inhale 2 puffs into the lungs every 6 (six) hours as needed for wheezing. ), Disp: 8.5 g, Rfl: 3 .  silver sulfADIAZINE (SILVADENE) 1 % cream, Apply 1 application topically daily. (Patient not taking: Reported on 08/26/2019), Disp: 50 g, Rfl: 0 .  sulfamethoxazole-trimethoprim (BACTRIM DS) 800-160 MG tablet, Take 1 tablet by mouth 2 (two) times daily., Disp: 20 tablet, Rfl: 0 .  SUMAtriptan (IMITREX) 50 MG tablet, TAKE 1 TAB EVERY 2 HOURS AS NEEDED FOR MIGRAINE. MAY REPEAT IN 2 HOURS IF PERSISTS OR RECURS, Disp: 10 tablet, Rfl: 3 .  topiramate (TOPAMAX) 50 MG tablet, TAKE 1 TABLET BY MOUTH TWICE A DAY, Disp: 180 tablet, Rfl: 2 .  traZODone (DESYREL) 100 MG tablet, Take 100 mg by mouth at bedtime., Disp: , Rfl: 0 .  triamcinolone (KENALOG) 0.025 % ointment, Apply 1 application topically daily., Disp: 30 g, Rfl: 0 .  XIIDRA 5 % SOLN, Place 1 drop into both eyes 2 (two) times daily. , Disp: , Rfl:   Social History   Tobacco Use  Smoking Status Never Smoker  Smokeless Tobacco Never Used    Allergies  Allergen Reactions  . Penicillins Hives and Rash    No anaphylactic or severe cutaneous Sx > 10 yr ago, no additional medical attention required Tolerates Keflex and Rocephin  . Tetracyclines & Related Other (See Comments)    Syncope and put her "in a coma"  . Tramadol Other (See Comments)    Seizures  . Phenazopyridine Other (See Comments)    Unknown  . Tetracycline Other (See Comments)    Syncope and "Put me in a coma"  . Ciprofloxacin Rash and Itching  . Codeine Itching and Rash  . Estradiol Rash    Patches broke out the skin   Objective:  There were no vitals filed for this visit. There is no height or weight on file to calculate BMI. Constitutional Well developed. Well nourished.  Vascular Foot warm and well perfused. Capillary refill normal to all digits.   Neurologic Normal speech. Oriented to person, place, and  time. Epicritic sensation to light touch grossly present bilaterally.  Dermatologic Skin healing well without signs of infection. Skin edges well coapted without signs of infection.  Orthopedic: Tenderness to palpation noted about the surgical site.   Radiographs: None Assessment:   1. Equinus contracture of ankle   2. Status post foot surgery    Plan:  Patient was evaluated and treated and all questions answered.  S/p foot surgery right -Progressing  as expected post-operatively. -XR: None -WB Status: Weightbearing as tolerated in cam boot -Sutures: Intact.  No signs of dehiscence noted.  No clinical signs of infection noted.  Plan on taking them out next clinical visit -Medications: None -Foot redressed.  No follow-ups on file.

## 2020-02-08 ENCOUNTER — Ambulatory Visit (INDEPENDENT_AMBULATORY_CARE_PROVIDER_SITE_OTHER): Payer: 59 | Admitting: Family Medicine

## 2020-02-08 ENCOUNTER — Other Ambulatory Visit: Payer: Self-pay

## 2020-02-08 VITALS — BP 130/80 | HR 98 | Temp 98.1°F | Ht 64.0 in | Wt 240.0 lb

## 2020-02-08 DIAGNOSIS — G43001 Migraine without aura, not intractable, with status migrainosus: Secondary | ICD-10-CM

## 2020-02-08 DIAGNOSIS — I10 Essential (primary) hypertension: Secondary | ICD-10-CM

## 2020-02-08 DIAGNOSIS — Z1231 Encounter for screening mammogram for malignant neoplasm of breast: Secondary | ICD-10-CM | POA: Diagnosis not present

## 2020-02-08 DIAGNOSIS — Z23 Encounter for immunization: Secondary | ICD-10-CM | POA: Diagnosis not present

## 2020-02-08 DIAGNOSIS — Z0000 Encounter for general adult medical examination without abnormal findings: Secondary | ICD-10-CM

## 2020-02-08 DIAGNOSIS — Z0001 Encounter for general adult medical examination with abnormal findings: Secondary | ICD-10-CM | POA: Diagnosis not present

## 2020-02-08 DIAGNOSIS — M545 Low back pain, unspecified: Secondary | ICD-10-CM | POA: Diagnosis not present

## 2020-02-08 MED ORDER — SULFAMETHOXAZOLE-TRIMETHOPRIM 800-160 MG PO TABS
1.0000 | ORAL_TABLET | Freq: Two times a day (BID) | ORAL | 0 refills | Status: DC
Start: 2020-02-08 — End: 2020-04-28

## 2020-02-08 NOTE — Progress Notes (Signed)
Subjective:    Patient ID: [name] [name], female    DOB: 1965-10-09, 54 y.o.   MRN: [id number]  HPI Patient is here today for complete physical exam.  Her last colonoscopy was reported in January 2013.  She is due for this again in 2023.  Her last mammogram was January 2019.  This is overdue.  Last year she deferred scheduling it preferring to schedule it with her mother however this fell through the cracks.  Therefore I requested to allow me to go ahead and schedule her mammogram so that it does not get forgotten.  She has a history of a hysterectomy and therefore she does not require a Pap smear.  She is due for her flu shot.  She is also due for Covid vaccine and a shingles vaccine.  The patient seems very resistant to the Covid vaccine.  I try to convince her that I thought it was in her best interest.  However she would like to receive the flu shot today.  We also discussed the shingles vaccine.  She also reports a 5-day history of dysuria, urgency, frequency, and hesitancy.  Urinalysis is grossly abnormal however the patient is on Azo and therefore it is inaccurate. Past Medical History:  Diagnosis Date  . Anxiety   . Arthritis    Phreesia 02/08/2020  . Asthma    mild intermittent  . Asthma    Phreesia 02/08/2020  . Bipolar 1 disorder ([place])    ect treatments last treatment Sep 02 1011  . Depression   . Depression    [employer]reesia 02/08/2020  . GERD (gastroesophageal reflux disease)   . Hypertension   . Pre-diabetes   . Seizures ([place])    last seizure was so long ago she can't remember.   . Sleep apnea    wears CPAP, uncertain of setting  . Substance abuse ([place])    Phreesia 02/08/2020   Past Surgical History:  Procedure Laterality Date  . ABDOMINAL HYSTERECTOMY    . AMPUTATION TOE Left 02/02/2018   Procedure: AMPUTATION TOE Left 4th toe;  Surgeon: [name], DPM;  Location: [facility];  Service: Podiatry;  Laterality: Left;  . APPENDECTOMY N/A    Phreesia 02/08/2020  .  BACK SURGERY    . CARPAL TUNNEL RELEASE     x2  . Laproscopic knee surgery    . NECK SURGERY  03/15/2017  . PLANTAR FASCIA RELEASE     x2  . SPINE SURGERY N/A    [employer]reesia 02/08/2020  . 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 AIMOVIG 70 MG/ML SOAJ INJECT 70 MG INTO THE SKIN EVERY 30 (THIRTY) DAYS. 1 pen 0  . busPIRone (BUSPAR) 15 MG tablet Take 15 mg by mouth 3 (three) times daily.   2  . Calcium Carbonate-Vit D-Min (CALCIUM 1200 PO) Take 1,200 mg by mouth daily.     . cyclobenzaprine (FLEXERIL) 10 MG tablet Take 10 mg by mouth 2 (two) times daily as needed for muscle spasms.     . DULoxetine (CYMBALTA) 60 MG capsule TAKE 1 CAPSULE BY MOUTH EVERY DAY 90 capsule 2  . escitalopram (LEXAPRO) 10 MG tablet Take 10 mg by mouth daily.    Marland Kitchen estradiol (ESTRACE) 1 MG tablet Take 1 tablet (1 mg total) by mouth at  bedtime. 90 tablet 3  . fluticasone (FLONASE) 50 MCG/ACT nasal spray SPRAY 2 SPRAYS INTO EACH NOSTRIL EVERY DAY 48 mL 2  . gabapentin (NEURONTIN) 600 MG tablet Take 600 mg by mouth 4 (four) times daily.    . haloperidol (HALDOL) 5 MG tablet Take 1 tablet (5 mg total) by mouth at bedtime. For psychosis    . HYDROcodone-acetaminophen (NORCO) 10-325 MG tablet Take 1 tablet by mouth every 6 (six) hours as needed.    Marland Kitchen levocetirizine (XYZAL) 5 MG tablet TAKE 1 TABLET BY MOUTH EVERY DAY IN THE EVENING 90 tablet 1  . linaclotide (LINZESS) 290 MCG CAPS capsule TAKE 1 CAPSULE EVERY MORNING BEFORE BREAKFAST 90 capsule 3  . meloxicam (MOBIC) 15 MG tablet Take 15 mg by mouth at bedtime.   2  . metoprolol succinate (TOPROL-XL) 25 MG 24 hr tablet Take 1 tablet (25 mg total) by mouth daily. 90 tablet 0  . morphine (MS CONTIN) 30 MG 12 hr tablet Take 30 mg by mouth every 8 (eight)  hours.     . Multiple Vitamins-Minerals (MULTIVITAMIN WITH MINERALS) tablet Take 1 tablet by mouth daily.    . mupirocin cream (BACTROBAN) 2 % Apply 1 application topically 2 (two) times daily. 15 g 0  . NARCAN 4 MG/0.1ML LIQD nasal spray kit Place 1 spray into the nose once.     . nystatin (MYCOSTATIN/NYSTOP) powder APPLY TO AFFECTED AREA 4 TIMES A DAY 60 g 0  . pantoprazole (PROTONIX) 40 MG tablet TAKE 1 TABLET BY MOUTH TWICE A DAY (Patient taking differently: Take 40 mg by mouth 2 (two) times daily. ) 180 tablet 3  . potassium chloride SA (KLOR-CON M20) 20 MEQ tablet TAKE TWO TABLETS BY MOUTH EVERY DAY 180 tablet 1  . PROAIR HFA 108 (90 Base) MCG/ACT inhaler Inhale 2 puffs = 127mcg into the lungs every 6 (six) hours as needed for wheezing or shortness of breath. (Patient taking differently: Inhale 2 puffs into the lungs every 6 (six) hours as needed for wheezing. ) 8.5 g 3  . silver sulfADIAZINE (SILVADENE) 1 % cream Apply 1 application topically daily. 50 g 0  . SUMAtriptan (IMITREX) 50 MG tablet TAKE 1 TAB EVERY 2 HOURS AS NEEDED FOR MIGRAINE. MAY REPEAT IN 2 HOURS IF PERSISTS OR RECURS 10 tablet 3  . topiramate (TOPAMAX) 50 MG tablet TAKE 1 TABLET BY MOUTH TWICE A DAY 180 tablet 2  . traZODone (DESYREL) 100 MG tablet Take 100 mg by mouth at bedtime.  0  . triamcinolone (KENALOG) 0.025 % ointment Apply 1 application topically daily. 30 g 0  . XIIDRA 5 % SOLN Place 1 drop into both eyes 2 (two) times daily.     Marland Kitchen oxyCODONE-acetaminophen (PERCOCET/ROXICET) 5-325 MG tablet Take 1 tablet by mouth every 8 (eight) hours as needed for severe pain. (Patient not taking: Reported on 08/26/2019) 10 tablet 0   No current facility-administered medications on file prior to visit.   Allergies  Allergen Reactions  . Other   . Penicillins Hives and Rash    No anaphylactic or severe cutaneous Sx > 10 yr ago, no additional medical attention required Tolerates Keflex and Rocephin  . Tetracyclines & Related  Other (See Comments)    Syncope and put her "in a coma"  . Tramadol Other (See Comments)    Seizures  . Phenazopyridine Other (See Comments)    Unknown  . Tetracycline Other (See Comments)    Syncope and "Put me in a coma"  .  Ciprofloxacin Rash and Itching  . Codeine Itching and Rash  . Estradiol Rash    Patches broke out the skin   Social History   Socioeconomic History  . Marital status: Widowed    Spouse name: Not on file  . Number of children: 3  . Years of education: 18  . Highest education level: Not on file  Occupational History  . Occupation: Disabled  Tobacco Use  . Smoking status: Never Smoker  . Smokeless tobacco: Never Used  Vaping Use  . Vaping Use: Never used  Substance and Sexual Activity  . Alcohol use: No  . Drug use: No  . Sexual activity: Not on file  Other Topics Concern  . Not on file  Social History Narrative   Patient drink 4 16oz caffeine drinks a day    Social Determinants of Health   Financial Resource Strain:   . Difficulty of Paying Living Expenses: Not on file  Food Insecurity:   . Worried About Programme researcher, broadcasting/film/video in the Last Year: Not on file  . Ran Out of Food in the Last Year: Not on file  Transportation Needs:   . Lack of Transportation (Medical): Not on file  . Lack of Transportation (Non-Medical): Not on file  Physical Activity:   . Days of Exercise per Week: Not on file  . Minutes of Exercise per Session: Not on file  Stress:   . Feeling of Stress : Not on file  Social Connections:   . Frequency of Communication with Friends and Family: Not on file  . Frequency of Social Gatherings with Friends and Family: Not on file  . Attends Religious Services: Not on file  . Active Member of Clubs or Organizations: Not on file  . Attends Banker Meetings: Not on file  . Marital Status: Not on file  Intimate Partner Violence:   . Fear of Current or Ex-Partner: Not on file  . Emotionally Abused: Not on file  .  Physically Abused: Not on file  . Sexually Abused: Not on file   Family History  Problem Relation Age of Onset  . Diabetes Mother   . COPD Mother   . Hypertension Mother   . Hyperlipidemia Mother   . Heart disease Father   . Hyperlipidemia Father   . Hypertension Father   . Cancer Father   . Heart disease Brother   . Heart disease Daughter   . Heart disease Maternal Grandmother   . Hypertension Maternal Grandmother   . Heart disease Maternal Grandfather   . Kidney cancer Paternal Grandmother   . Heart disease Paternal Grandfather       Review of Systems  All other systems reviewed and are negative.      Objective:   Physical Exam Vitals reviewed.  Constitutional:      General: She is not in acute distress.    Appearance: She is well-developed. She is not diaphoretic.  HENT:     Head: Normocephalic and atraumatic.     Right Ear: External ear normal.     Left Ear: External ear normal.     Nose: Nose normal.     Mouth/Throat:     Pharynx: No oropharyngeal exudate.  Eyes:     General: No scleral icterus.       Right eye: No discharge.        Left eye: No discharge.     Conjunctiva/sclera: Conjunctivae normal.     Pupils: Pupils are equal, round, and  reactive to light.  Neck:     Thyroid: No thyromegaly.     Vascular: No JVD.     Trachea: No tracheal deviation.  Cardiovascular:     Rate and Rhythm: Normal rate and regular rhythm.     Heart sounds: Normal heart sounds. No murmur heard.  No friction rub. No gallop.   Pulmonary:     Effort: Pulmonary effort is normal. No respiratory distress.     Breath sounds: Normal breath sounds. No stridor. No wheezing or rales.  Chest:     Chest wall: No tenderness.  Abdominal:     General: Bowel sounds are normal. There is no distension.     Palpations: Abdomen is soft. There is no mass.     Tenderness: There is no abdominal tenderness. There is no guarding or rebound.  Musculoskeletal:        General: No tenderness.  Normal range of motion.     Cervical back: Normal range of motion and neck supple.  Lymphadenopathy:     Cervical: No cervical adenopathy.  Skin:    General: Skin is warm.     Coloration: Skin is not pale.     Findings: No erythema or rash.  Neurological:     Mental Status: She is alert and oriented to person, place, and time.     Cranial Nerves: No cranial nerve deficit.     Motor: No abnormal muscle tone.     Coordination: Coordination normal.     Deep Tendon Reflexes: Reflexes are normal and symmetric.  Psychiatric:        Behavior: Behavior normal.        Thought Content: Thought content normal.        Judgment: Judgment normal.           Assessment & Plan:  Acute left-sided low back pain, unspecified whether sciatica present - Plan: Urine Culture  Encounter for screening mammogram for malignant neoplasm of breast - Plan: MM Digital Screening  General medical exam - Plan: CBC with Differential/Platelet, COMPLETE METABOLIC PANEL WITH GFR, Lipid panel  Benign essential HTN  Migraine without aura and with status migrainosus, not intractable  Patient received her flu shot today.  I encouraged her to get her Covid vaccine.  I will schedule the patient for mammogram.  Colonoscopy is due in 2023.  Given her history of a hysterectomy she does not require a Pap smear.  I will treat her urinary tract infection based on symptoms with Bactrim double strength tablets twice daily for 7 days.  Examination today is normal except for an elevated BMI.  I encouraged the patient to try to exercise and diet and lose weight.  She did have a rash on her left shoulder blade where there was still an EKG lead attached from her recent surgery a week ago.  I remove that and recommended cortisone cream for the rash.  Patient is in a cam walker on her right leg.  Apparently she had a recession performed of her gastrocnemius muscle due to an equinus contracture of her right ankle.  This is being managed by  her podiatrist

## 2020-02-09 LAB — CBC WITH DIFFERENTIAL/PLATELET
Absolute Monocytes: 467 cells/uL (ref 200–950)
Basophils Absolute: 29 cells/uL (ref 0–200)
Basophils Relative: 0.4 %
Eosinophils Absolute: 212 cells/uL (ref 15–500)
Eosinophils Relative: 2.9 %
HCT: 39.3 % (ref 35.0–45.0)
Hemoglobin: 12.8 g/dL (ref 11.7–15.5)
Lymphs Abs: 2117 cells/uL (ref 850–3900)
MCH: 31.4 pg (ref 27.0–33.0)
MCHC: 32.6 g/dL (ref 32.0–36.0)
MCV: 96.3 fL (ref 80.0–100.0)
MPV: 9.3 fL (ref 7.5–12.5)
Monocytes Relative: 6.4 %
Neutro Abs: 4475 cells/uL (ref 1500–7800)
Neutrophils Relative %: 61.3 %
Platelets: 318 10*3/uL (ref 140–400)
RBC: 4.08 10*6/uL (ref 3.80–5.10)
RDW: 13.1 % (ref 11.0–15.0)
Total Lymphocyte: 29 %
WBC: 7.3 10*3/uL (ref 3.8–10.8)

## 2020-02-09 LAB — COMPLETE METABOLIC PANEL WITH GFR
AG Ratio: 1.3 (calc) (ref 1.0–2.5)
ALT: 13 U/L (ref 6–29)
AST: 19 U/L (ref 10–35)
Albumin: 3.9 g/dL (ref 3.6–5.1)
Alkaline phosphatase (APISO): 78 U/L (ref 37–153)
BUN: 13 mg/dL (ref 7–25)
CO2: 25 mmol/L (ref 20–32)
Calcium: 9.6 mg/dL (ref 8.6–10.4)
Chloride: 105 mmol/L (ref 98–110)
Creat: 0.7 mg/dL (ref 0.50–1.05)
GFR, Est African American: 112 mL/min/{1.73_m2} (ref >=60)
GFR, Est Non African American: 97 mL/min/{1.73_m2} (ref >=60)
Globulin: 2.9 g/dL (calc) (ref 1.9–3.7)
Glucose, Bld: 101 mg/dL — ABNORMAL HIGH (ref 65–99)
Potassium: 4.6 mmol/L (ref 3.5–5.3)
Sodium: 139 mmol/L (ref 135–146)
Total Bilirubin: 0.4 mg/dL (ref 0.2–1.2)
Total Protein: 6.8 g/dL (ref 6.1–8.1)

## 2020-02-09 LAB — LIPID PANEL
Cholesterol: 189 mg/dL (ref <200)
HDL: 60 mg/dL (ref >=50)
LDL Cholesterol (Calc): 93 mg/dL (calc)
Non-HDL Cholesterol (Calc): 129 mg/dL (calc) (ref <130)
Total CHOL/HDL Ratio: 3.2 (calc) (ref <5.0)
Triglycerides: 297 mg/dL — ABNORMAL HIGH (ref <150)

## 2020-02-10 LAB — URINE CULTURE
MICRO NUMBER:: 11107232
SPECIMEN QUALITY:: ADEQUATE

## 2020-02-14 ENCOUNTER — Ambulatory Visit (INDEPENDENT_AMBULATORY_CARE_PROVIDER_SITE_OTHER): Payer: 59 | Admitting: Podiatry

## 2020-02-14 ENCOUNTER — Other Ambulatory Visit: Payer: Self-pay

## 2020-02-14 DIAGNOSIS — M24573 Contracture, unspecified ankle: Secondary | ICD-10-CM

## 2020-02-14 DIAGNOSIS — Z9889 Other specified postprocedural states: Secondary | ICD-10-CM

## 2020-02-15 ENCOUNTER — Other Ambulatory Visit: Payer: Self-pay

## 2020-02-15 MED ORDER — LEVOCETIRIZINE DIHYDROCHLORIDE 5 MG PO TABS: ORAL_TABLET | ORAL | 1 refills | Status: DC

## 2020-02-15 NOTE — Progress Notes (Signed)
Subjective: [name] [name] is a 54 y.o. is seen today in office s/p right foot gastroc recession preformed on 01/16/2020.  Presents today for suture, staple removal.  States that she is doing well.  Minimal discomfort.  She has been walking the cam boot.  She does use crutches for assistance at times.  She has no new concerns today. Denies any systemic complaints such as fevers, chills, nausea, vomiting. No calf pain, chest pain, shortness of breath.   Objective: General: No acute distress, AAOx3  DP/PT pulses palpable 2/4, CRT < 3 sec to all digits.  Protective sensation intact. Motor function intact.  RIGHT foot: Incision is well coapted without any evidence of dehiscence with sutures, staples intact.  There is no surrounding erythema, ascending cellulitis peer there is no fluctuance or crepitation peer there is no malodor.  No obvious signs of infection.  Improvement of motion dorsiflexion.  The area that was causing some skin breakdown, callus along the amputation stump is doing well without any breakdown today is much improved. No other open lesions or pre-ulcerative lesions.  No pain with calf compression, swelling, warmth, erythema.   Assessment and Plan:  Status post right foot gastrocnemius recession, doing well with no complications   -Treatment options discussed including all alternatives, risks, and complications -Today remove the sutures, staples were in complications.  Incision made well coapted.  Antibiotic ointment and bandage applied.  She can start to wash the area soap and water and dry thoroughly.  For now continue cam boot discussed range of motion, rehab exercises.  Awaiting shoes, inserts.  [name] DPM

## 2020-02-17 ENCOUNTER — Other Ambulatory Visit: Payer: Self-pay | Admitting: Family Medicine

## 2020-02-20 ENCOUNTER — Other Ambulatory Visit: Payer: Self-pay

## 2020-02-21 ENCOUNTER — Other Ambulatory Visit: Payer: Self-pay | Admitting: Family Medicine

## 2020-02-28 ENCOUNTER — Other Ambulatory Visit: Payer: Self-pay | Admitting: Family Medicine

## 2020-02-29 ENCOUNTER — Other Ambulatory Visit: Payer: Self-pay | Admitting: Cardiovascular Disease

## 2020-02-29 ENCOUNTER — Other Ambulatory Visit: Payer: Self-pay

## 2020-02-29 MED ORDER — AIMOVIG 70 MG/ML ~~LOC~~ SOAJ
70.0000 mg | SUBCUTANEOUS | 3 refills | Status: DC
Start: 2020-02-29 — End: 2020-11-10

## 2020-02-29 NOTE — Telephone Encounter (Signed)
° ° ° °*  STAT* If patient is at the pharmacy, call can be transferred to refill team.   1. Which medications need to be refilled? (please list name of each medication and dose if known)   metoprolol succinate (TOPROL-XL) 25 MG 24 hr tablet     2. Which pharmacy/location (including street and city if local pharmacy) is medication to be sent to? [facility] [address], [place] AT [place]  3. Do they need a 30 day or 90 day supply? 90 days

## 2020-03-03 MED ORDER — METOPROLOL SUCCINATE ER 25 MG PO TB24
25.0000 mg | ORAL_TABLET | Freq: Every day | ORAL | 0 refills | Status: DC
Start: 2020-03-03 — End: 2020-04-01

## 2020-03-03 NOTE — Telephone Encounter (Signed)
Rx has been sent to the pharmacy electronically. ° °

## 2020-03-04 ENCOUNTER — Other Ambulatory Visit: Payer: Self-pay

## 2020-03-04 ENCOUNTER — Encounter: Payer: Self-pay | Admitting: Podiatry

## 2020-03-04 ENCOUNTER — Ambulatory Visit (INDEPENDENT_AMBULATORY_CARE_PROVIDER_SITE_OTHER): Payer: 59 | Admitting: Podiatry

## 2020-03-04 VITALS — BP 131/91 | HR 86 | Temp 98.2°F | Resp 14

## 2020-03-04 DIAGNOSIS — L97512 Non-pressure chronic ulcer of other part of right foot with fat layer exposed: Secondary | ICD-10-CM

## 2020-03-10 NOTE — Progress Notes (Signed)
Subjective: [name] [name] is a 54 y.o. is seen today for an acute appointment.  She states that she noticed a ulcer forming her right foot.  She wants to make sure that it is not infected.  From a surgical standpoint she has been doing well is.  She underwent a right gastrocnemius recession on January 16, 2020.  7 no issue with the surgery site. Denies any systemic complaints such as fevers, chills, nausea, vomiting. No calf pain, chest pain, shortness of breath.   Objective: General: No acute distress, AAOx3  DP/PT pulses palpable 2/4, CRT < 3 sec to all digits.  Protective sensation intact. Motor function intact.  RIGHT foot: Incision is well coapted without any evidence of dehiscence.  No edema, erythema or signs of infection.  To the plantar medial aspect of the right foot is a hyperkeratotic lesion with central granular wound.  After debridement measures approximate 0.7 x 0.5 cm with a depth of 0.2.  There is no probing to bone, undermining or tunneling.  There is no surrounding erythema, ascending cellulitis.  There is no fluctuation crepitation.  There is no malodor.  No obvious signs of infection noted today.  No other open lesions or pre-ulcerative lesions.  No pain with calf compression, swelling, warmth, erythema.   Assessment and Plan:  Status post right foot gastrocnemius recession, doing well with no complications   -Treatment options discussed including all alternatives, risks, and complications -Debrided the hyperkeratotic lesion as well as the wound utilizing the 312 scalpel down to healthy, bleeding, viable tissue to remove all nonviable tissue to promote wound healing.  Recommend antibiotic ointment dressing changes daily.  Offloading the remaining surgical boot.  Elevation.  [name] DPM

## 2020-03-11 IMAGING — CT CT LUMBAR SPINE WITHOUT CONTRAST
4 of 5 series · 14 of 33 positions shown, 17 images · non-contrast
Comparison: Thoracic spine CT 01/07/2015 and thoracic spine
radiograph 12/30/2017

CLINICAL DATA: Back pain

EXAM:
CT THORACIC AND LUMBAR SPINE WITHOUT CONTRAST
TECHNIQUE: Multidetector CT imaging of the thoracic and lumbar spine was
performed without contrast. Multiplanar CT image reconstructions
were also generated.

[Series 3: l-spine 2.00 br40 s3 lspine st · axial · 0.34mm/px · z∈[+1445,+1583]mm · 5 of 105 slices shown, 7 images]
[im 18/105  soft-tissue]
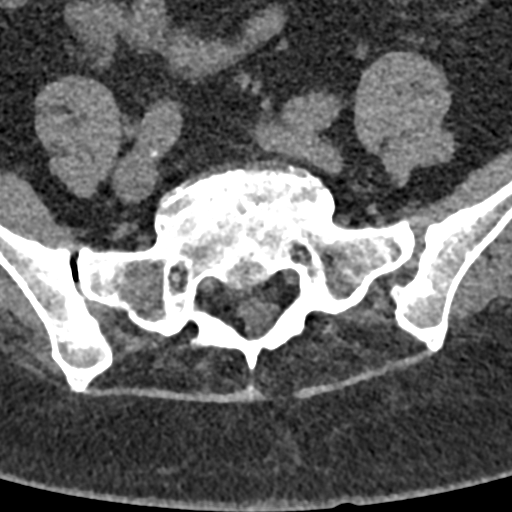
[im 18/105  bone]
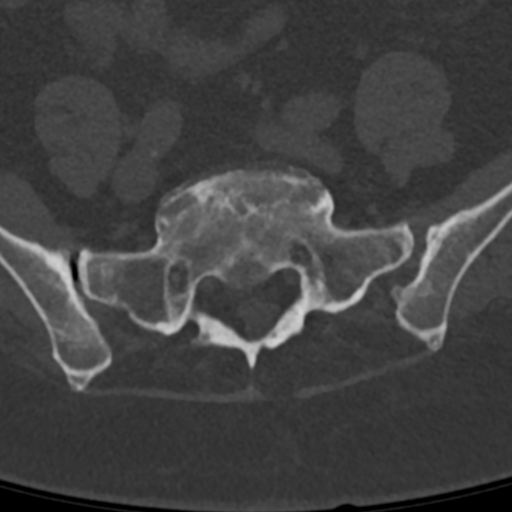
[im 35/105  bone]
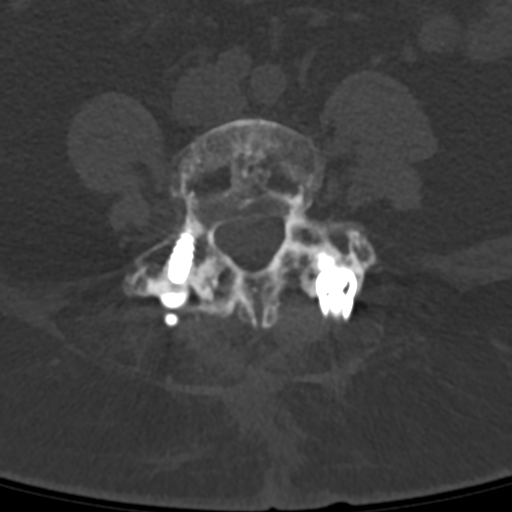
[im 53/105  bone]
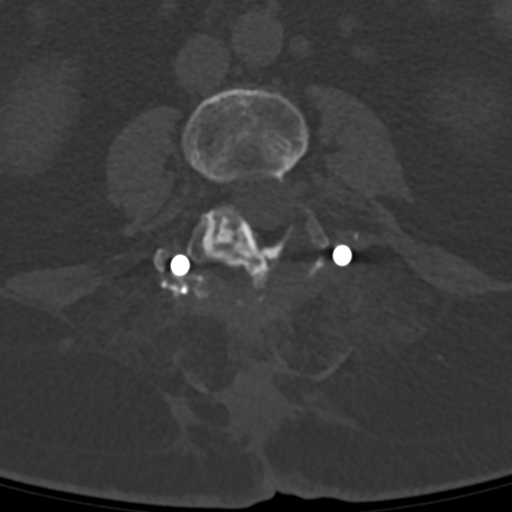
[im 70/105  bone]
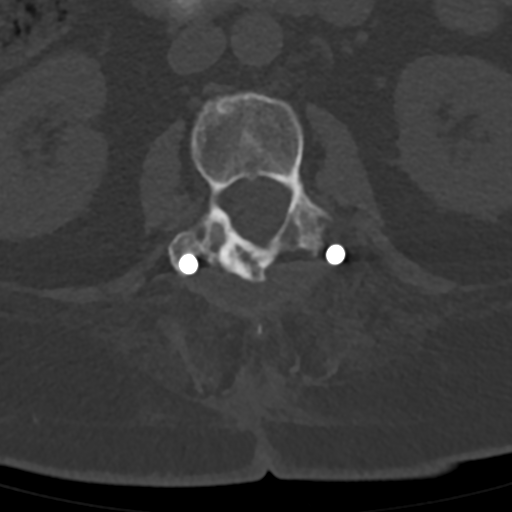
[im 87/105  soft-tissue]
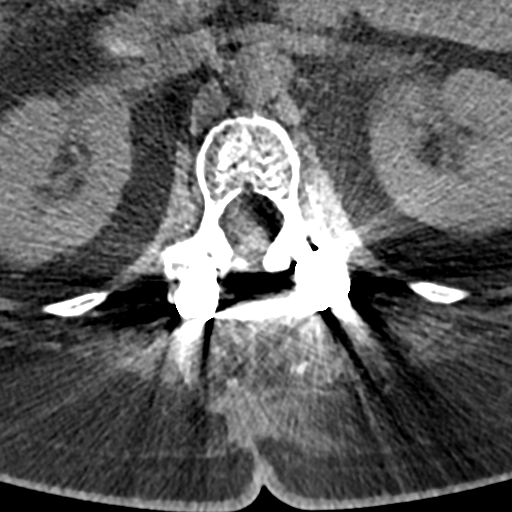
[im 87/105  bone]
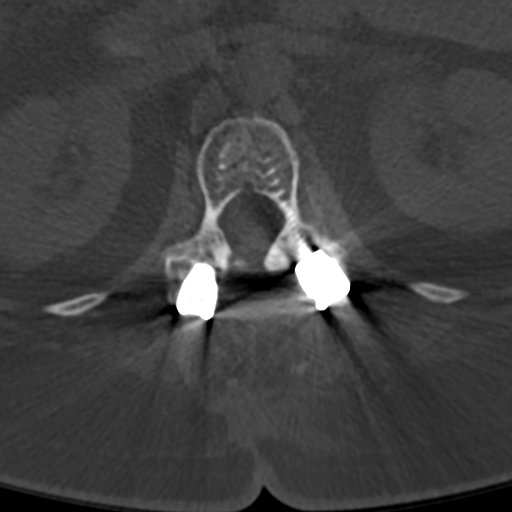

[Series 4: l-spine 2.00 br60 s3 lspine bone · axial · 0.34mm/px · z∈[+1445,+1515]mm · 3 of 105 slices shown]
[im 18/105  bone]
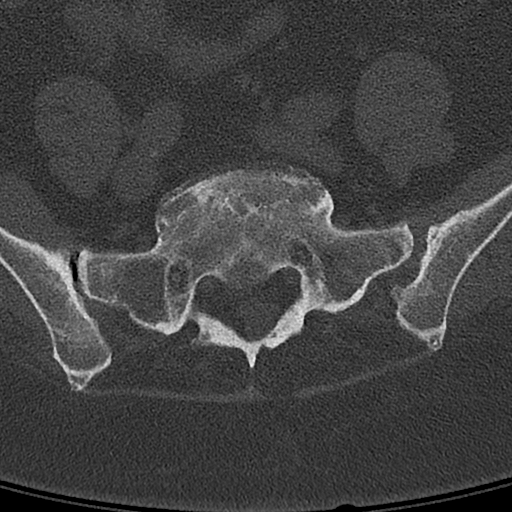
[im 35/105  bone]
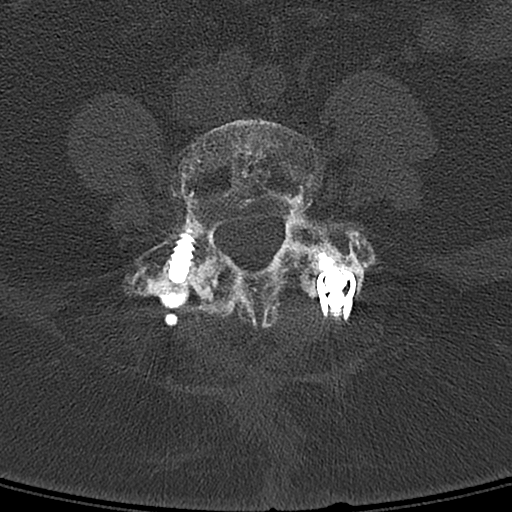
[im 53/105  bone]
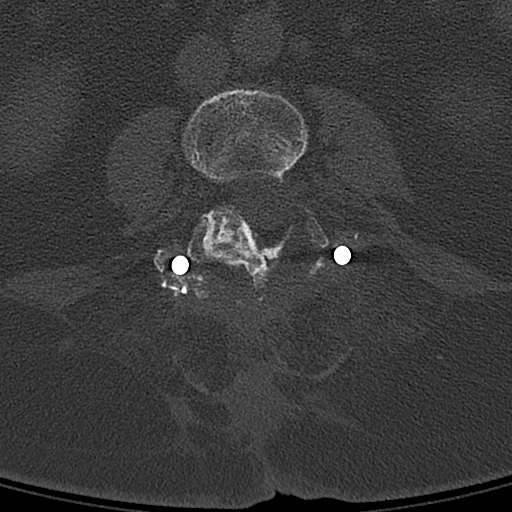

[Series 5: l-spine 2.00 br60 s3 sag bone · sagittal · 0.34mm/px · 5 of 88 slices shown, 6 images]
[im 30/88  bone]
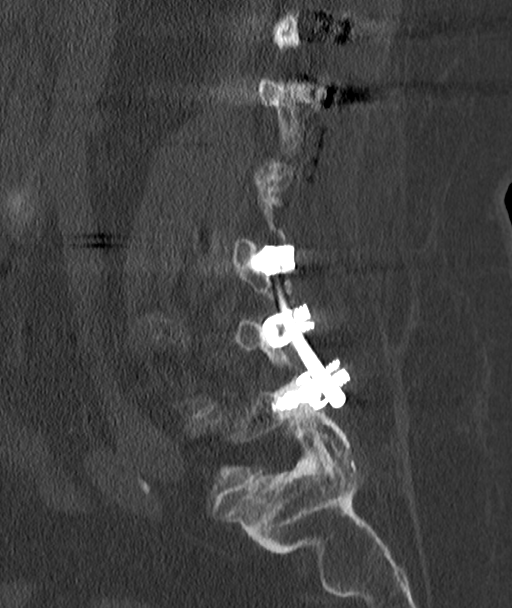
[im 37/88  bone]
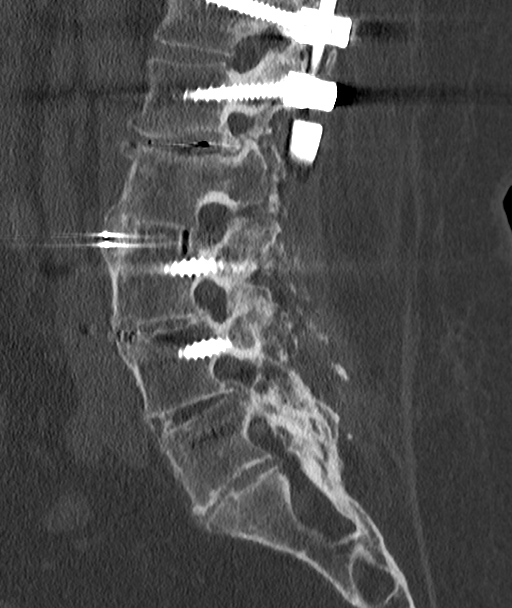
[im 44/88  soft-tissue]
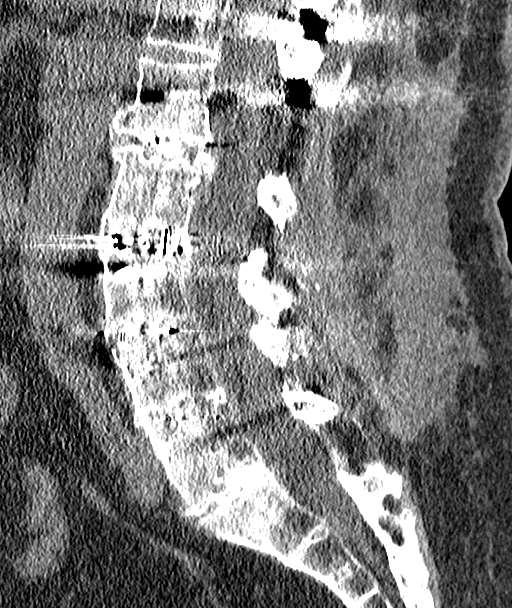
[im 44/88  bone]
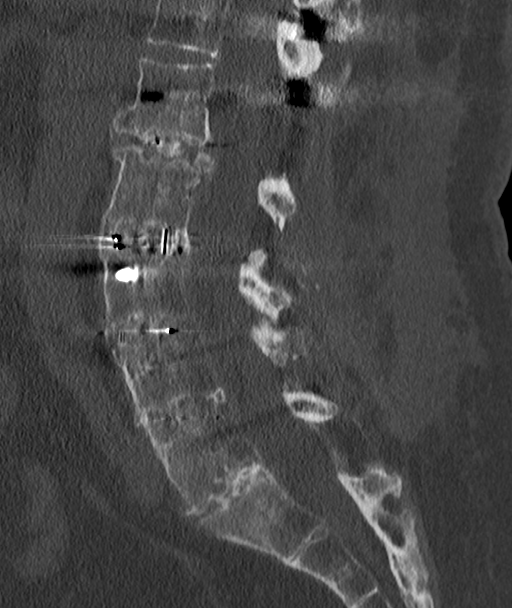
[im 51/88  bone]
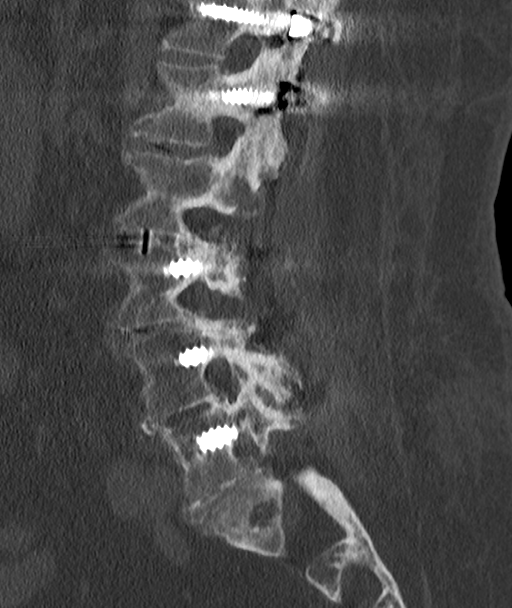
[im 59/88  bone]
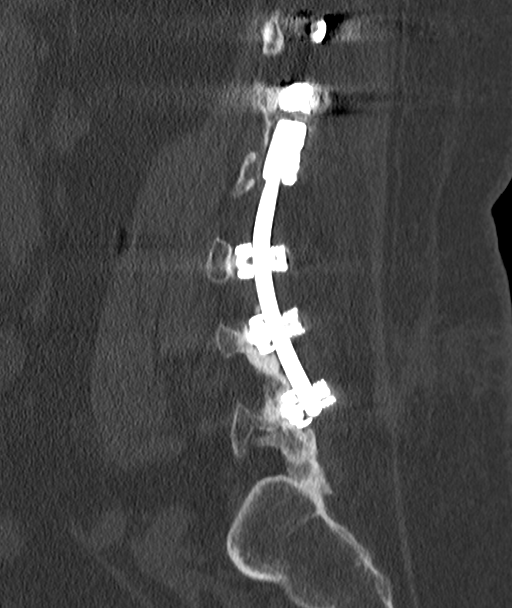

[Series 7: l-spine 2.00 br60 s3 cor bone · coronal · 0.34mm/px · 1 of 88 slices shown]
[im 44/88  bone]
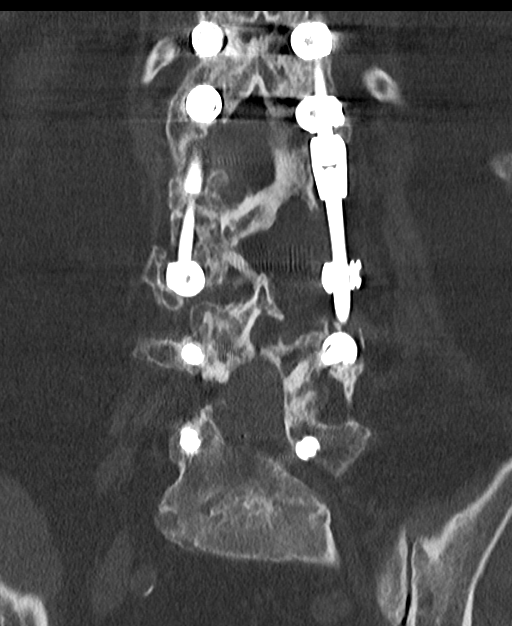

[14 of 33 positions shown; findings below may reference images not displayed]

FINDINGS: CT THORACIC SPINE FINDINGS

Alignment: Normal

Vertebrae: Long segment posterior spinal fusion extending from T4 to
L5. There are transpedicular screws at all levels the exception of
T9 and L2.

Paraspinal and other soft tissues: Negative.

Disc levels: Streak artifact from spinal hardware obscures the
spinal canal at multiple levels, but there is no visible canal
stenosis. There is lucency surrounding the tips of the T4
transpedicular screws, extending to the superior endplate. There is
no other abnormal lucency about any of the hardware.

CT LUMBAR SPINE FINDINGS

Segmentation: Normal

Alignment: Normal

Vertebrae: Posterior fusion hardware extends from the thoracic spine
2 L5. No abnormal lucency around any of the hardware. No fracture.
Solid L2-5 arthrodesis.

Paraspinal and other soft tissues: Negative.

Disc levels: There is posterior decompression at the L1-2 level with
wide patency of the spinal canal. The right subarticular recess is
mildly narrowed a posterior osteophyte. The spinal canal is
otherwise widely patent throughout the lumbar spine. There is
moderate right L1 and left L5 neural foraminal stenosis.
IMPRESSION: 1. Long segment posterior spinal fusion from T4-L5. Mild degree of
lucency surrounding the tips of the T4 transpedicular screws,
extending to the superior endplate. This may indicate a degree of
loosening.
2. No central spinal canal stenosis of the thoracic or lumbar spine.
No acute abnormality.
3. Moderate right L1 and left L5 neural foraminal stenosis,
unchanged.
4. Unchanged narrowing of the right lateral recess at L1-2, which
could contribute to irritation of the descending right L2 nerve
root.

## 2020-03-11 IMAGING — CT CT THORACIC SPINE WITHOUT CONTRAST
3 of 4 series · 10 of 33 positions shown, 12 images · non-contrast
Comparison: Thoracic spine CT 01/07/2015 and thoracic spine
radiograph 12/30/2017

CLINICAL DATA: Back pain

EXAM:
CT THORACIC AND LUMBAR SPINE WITHOUT CONTRAST
TECHNIQUE: Multidetector CT imaging of the thoracic and lumbar spine was
performed without contrast. Multiplanar CT image reconstructions
were also generated.

[Series 3: t-spine 2.00 br40 s3 · axial · 0.33mm/px · z∈[+1685,+1785]mm · 2 of 152 slices shown, 3 images]
[im 51/152  soft-tissue]
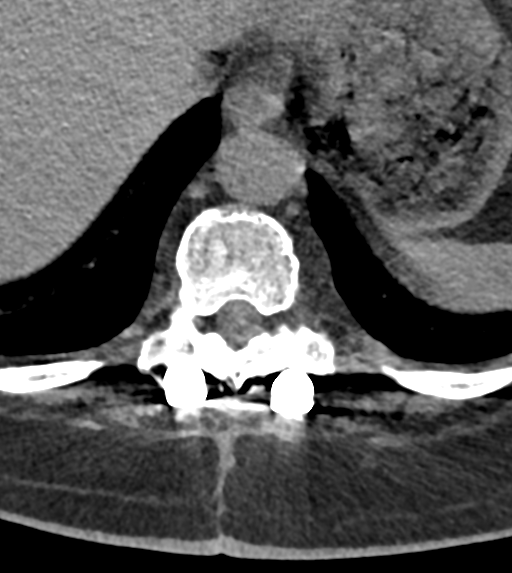
[im 51/152  bone]
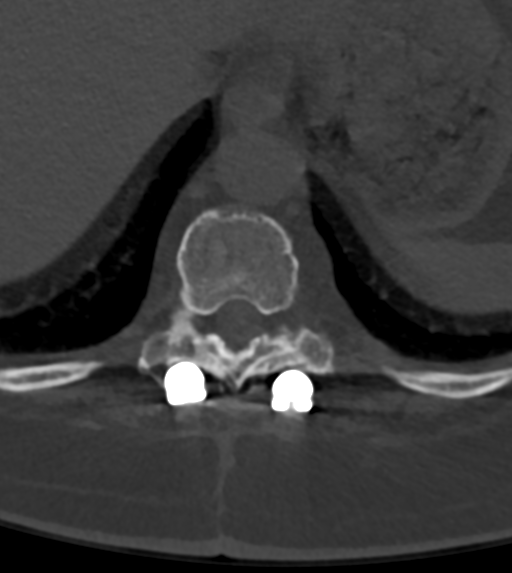
[im 101/152  bone]
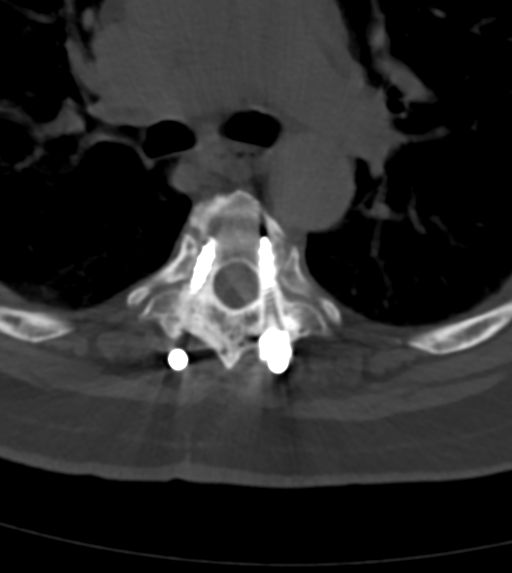

[Series 7: t-spine 2.00 br60 s3 · sagittal · 0.37mm/px · 5 of 83 slices shown, 6 images (1 of 2)]
[im 28/83  bone]
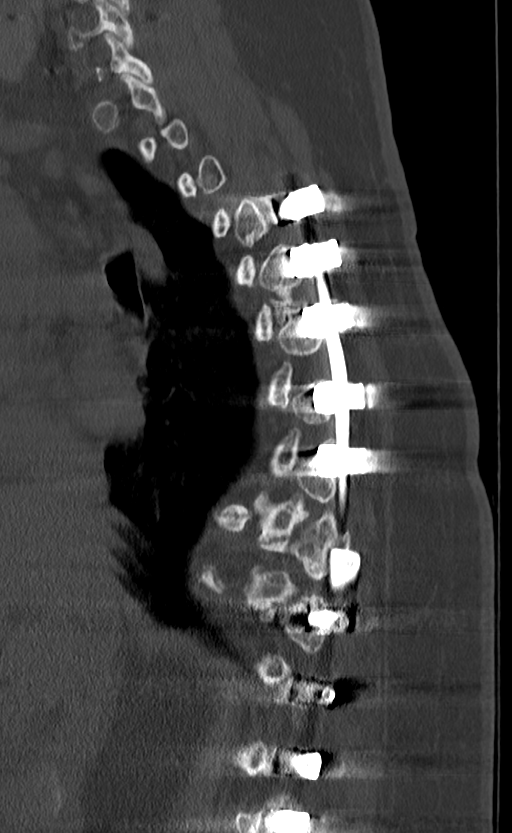
[im 35/83  bone]
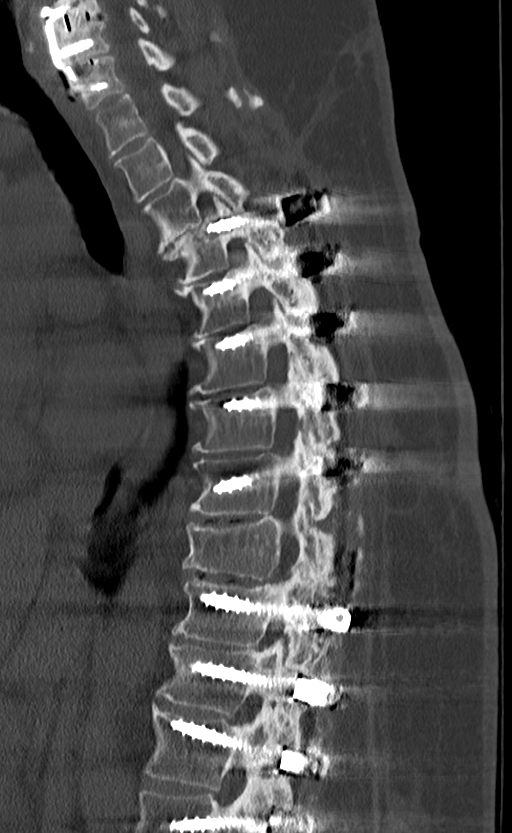
[im 42/83  soft-tissue]
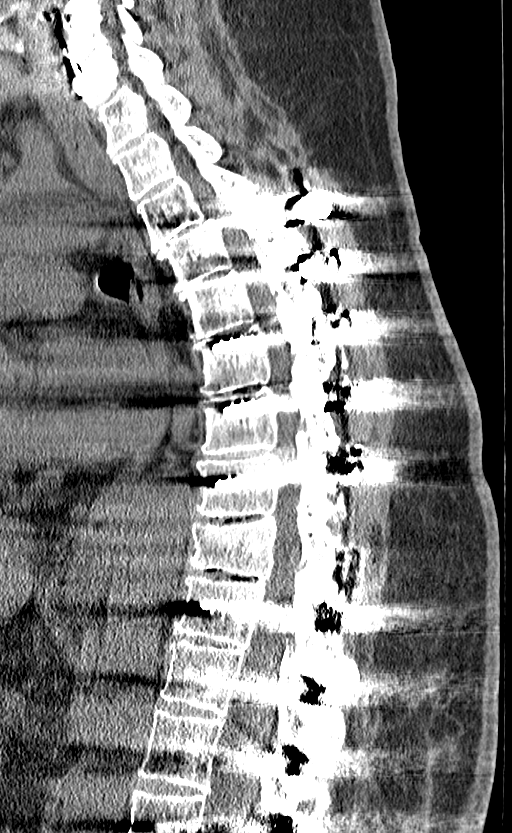
[im 42/83  bone]
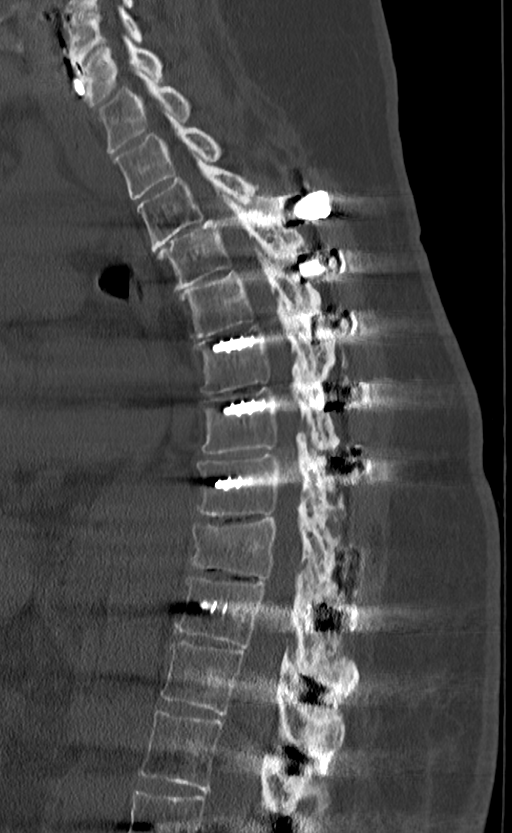
[im 48/83  bone]
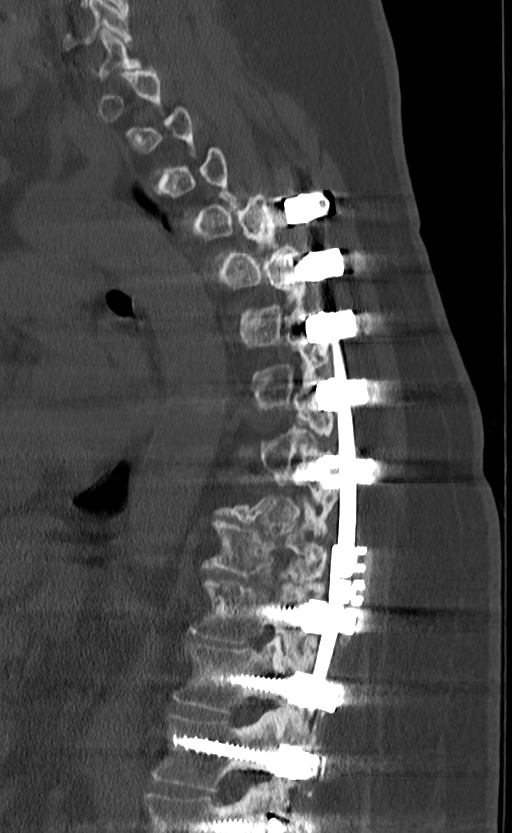
[im 55/83  bone]
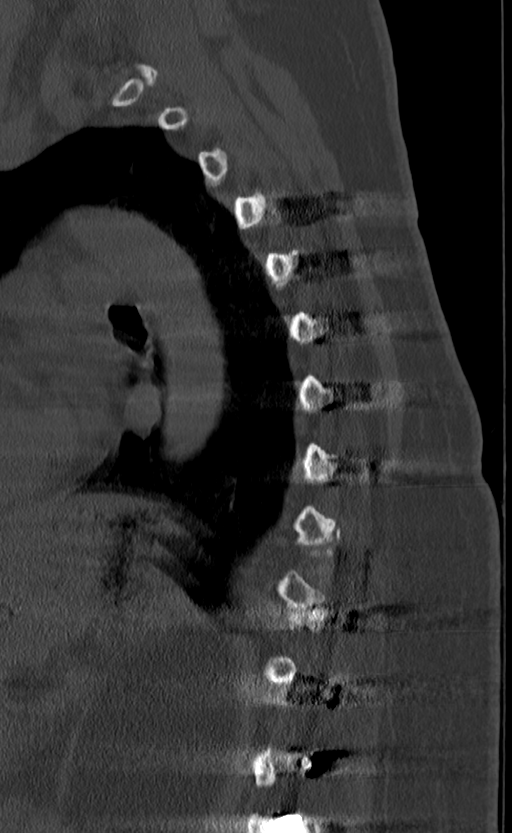

[Series 9: t-spine 2.00 br60 s3 · coronal · 0.33mm/px · 3 of 93 slices shown (2 of 2)]
[im 19/93  bone]
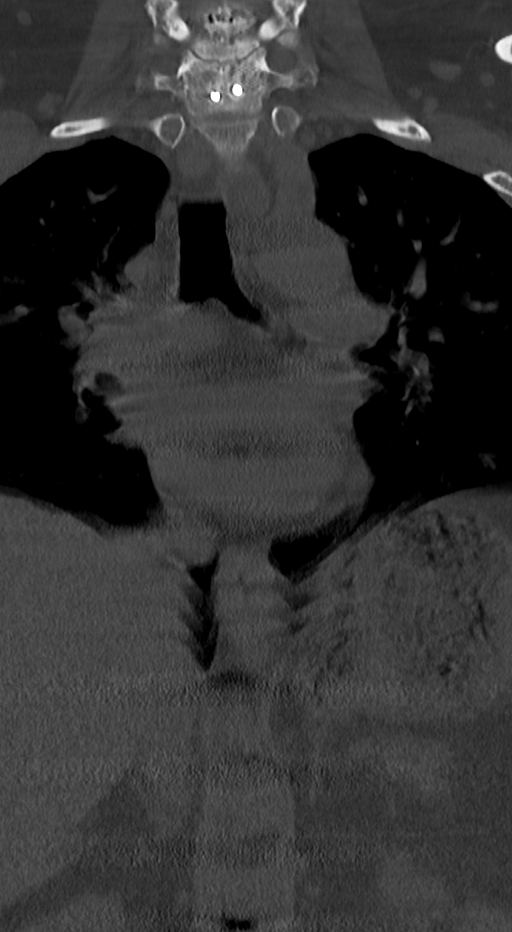
[im 37/93  bone]
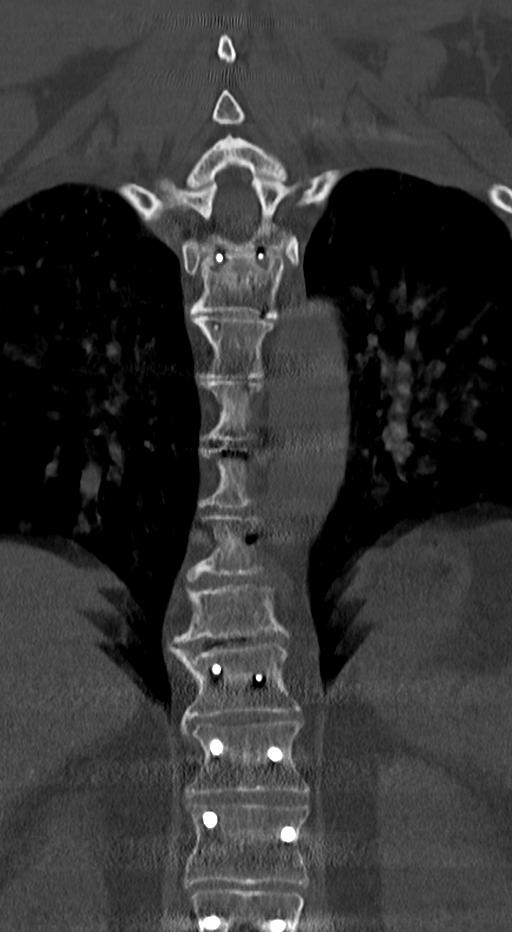
[im 56/93  bone]
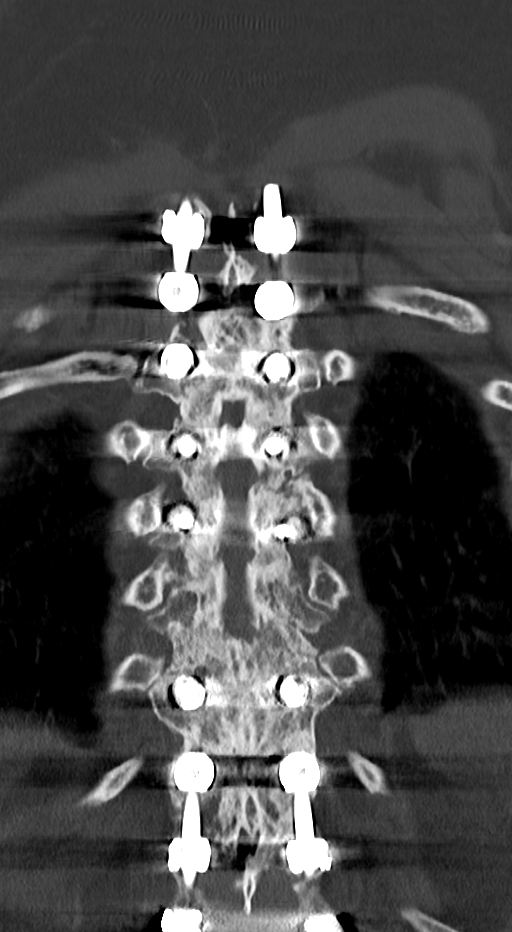

[10 of 33 positions shown; findings below may reference images not displayed]

FINDINGS: CT THORACIC SPINE FINDINGS

Alignment: Normal

Vertebrae: Long segment posterior spinal fusion extending from T4 to
L5. There are transpedicular screws at all levels the exception of
T9 and L2.

Paraspinal and other soft tissues: Negative.

Disc levels: Streak artifact from spinal hardware obscures the
spinal canal at multiple levels, but there is no visible canal
stenosis. There is lucency surrounding the tips of the T4
transpedicular screws, extending to the superior endplate. There is
no other abnormal lucency about any of the hardware.

CT LUMBAR SPINE FINDINGS

Segmentation: Normal

Alignment: Normal

Vertebrae: Posterior fusion hardware extends from the thoracic spine
2 L5. No abnormal lucency around any of the hardware. No fracture.
Solid L2-5 arthrodesis.

Paraspinal and other soft tissues: Negative.

Disc levels: There is posterior decompression at the L1-2 level with
wide patency of the spinal canal. The right subarticular recess is
mildly narrowed a posterior osteophyte. The spinal canal is
otherwise widely patent throughout the lumbar spine. There is
moderate right L1 and left L5 neural foraminal stenosis.
IMPRESSION: 1. Long segment posterior spinal fusion from T4-L5. Mild degree of
lucency surrounding the tips of the T4 transpedicular screws,
extending to the superior endplate. This may indicate a degree of
loosening.
2. No central spinal canal stenosis of the thoracic or lumbar spine.
No acute abnormality.
3. Moderate right L1 and left L5 neural foraminal stenosis,
unchanged.
4. Unchanged narrowing of the right lateral recess at L1-2, which
could contribute to irritation of the descending right L2 nerve
root.

## 2020-03-17 ENCOUNTER — Ambulatory Visit (INDEPENDENT_AMBULATORY_CARE_PROVIDER_SITE_OTHER): Payer: 59 | Admitting: Podiatry

## 2020-03-17 ENCOUNTER — Other Ambulatory Visit: Payer: Self-pay | Admitting: Podiatry

## 2020-03-17 ENCOUNTER — Other Ambulatory Visit: Payer: Self-pay

## 2020-03-17 ENCOUNTER — Other Ambulatory Visit: Payer: 59 | Admitting: Orthotics

## 2020-03-17 DIAGNOSIS — L97512 Non-pressure chronic ulcer of other part of right foot with fat layer exposed: Secondary | ICD-10-CM | POA: Diagnosis not present

## 2020-03-17 DIAGNOSIS — L24A9 Irritant contact dermatitis due friction or contact with other specified body fluids: Secondary | ICD-10-CM

## 2020-03-17 MED ORDER — CEPHALEXIN 500 MG PO CAPS: 500.0000 mg | ORAL_CAPSULE | Freq: Three times a day (TID) | ORAL | 0 refills | Status: DC

## 2020-03-19 NOTE — Progress Notes (Addendum)
Subjective: [name] [name] is a 54 y.o. is seen today for follow evaluation of a wound in the right foot.  She says she has had some thick drainage coming from the area.  Denies any increased swelling or redness.  She has no pain although she does have significant neuropathy.  Surgical has been doing well without any issues. Denies any systemic complaints such as fevers, chills, nausea, vomiting. No calf pain, chest pain, shortness of breath.   Objective: General: No acute distress, AAOx3  DP/PT pulses palpable 2/4, CRT < 3 sec to all digits.  Protective sensation intact. Motor function intact.  RIGHT foot: Incision is well coapted without any evidence of dehiscence, gastrocnemius recession site.  On the plantar medial aspect of the foot is a wound although smaller today 0.5 x 0.3 cm there is no probing, undermining or tunneling.  Starting to scab over.  There is no surrounding erythema, ascending cellulitis there is no fluctuation crepitation.  There is no malodor. No other open lesions or pre-ulcerative lesions.  No pain with calf compression, swelling, warmth, erythema.   Assessment and Plan:  Status post right foot gastrocnemius recession, doing well with no complications   -Treatment options discussed including all alternatives, risks, and complications -Debrided the hyperkeratotic lesion as well as the wound utilizing the 312 scalpel down to healthy, bleeding, viable tissue to remove all nonviable tissue to promote wound healing.  Continue daily dressing changes.  Given the drainage that she reports on prescribed Keflex.  Wound culture was obtained today.  Encouraged elevation offloading.  [name] DPM

## 2020-03-25 ENCOUNTER — Other Ambulatory Visit: Payer: Self-pay | Admitting: *Deleted

## 2020-03-25 MED ORDER — TOPIRAMATE 50 MG PO TABS
50.0000 mg | ORAL_TABLET | Freq: Two times a day (BID) | ORAL | 2 refills | Status: DC
Start: 2020-03-25 — End: 2020-12-19

## 2020-03-26 ENCOUNTER — Other Ambulatory Visit: Payer: Self-pay | Admitting: *Deleted

## 2020-03-26 ENCOUNTER — Telehealth: Payer: Self-pay | Admitting: *Deleted

## 2020-03-26 NOTE — Telephone Encounter (Signed)
Received request from pharmacy for PA on Aimovig 70mg / mL.  PA submitted.   Dx: G43.001- chronic migraine.   [facility] is reviewing your PA request. Typically an electronic response will be received within 72 hours. To check for an update later, open this request from your dashboard.

## 2020-03-27 NOTE — Telephone Encounter (Signed)
Received PA determination.   PA- [id number] cancelled.   This medication or product was previously approved on [id number] from 04/19/2020 to 04/18/2021.

## 2020-03-28 LAB — WOUND CULTURE
MICRO NUMBER:: 11253060
SPECIMEN QUALITY:: ADEQUATE

## 2020-03-28 LAB — HOUSE ACCOUNT TRACKING

## 2020-03-31 ENCOUNTER — Other Ambulatory Visit: Payer: Self-pay | Admitting: Cardiovascular Disease

## 2020-04-01 ENCOUNTER — Ambulatory Visit (INDEPENDENT_AMBULATORY_CARE_PROVIDER_SITE_OTHER): Payer: 59 | Admitting: Podiatry

## 2020-04-01 ENCOUNTER — Other Ambulatory Visit: Payer: Self-pay

## 2020-04-01 DIAGNOSIS — G629 Polyneuropathy, unspecified: Secondary | ICD-10-CM

## 2020-04-01 DIAGNOSIS — L97512 Non-pressure chronic ulcer of other part of right foot with fat layer exposed: Secondary | ICD-10-CM

## 2020-04-06 NOTE — Progress Notes (Signed)
Subjective: [name] [name] is a 54 y.o. is seen today for follow evaluation of a wound in the right foot.  She states that she feels that the wound will close and then opens back up when she starts to put weight on it.  She is does walking the cam boot.  She currently denies any increase in drainage or any swelling or redness.  She can continue with daily dressing changes.  Currently denies any fevers, chills, nausea vomiting.  No calf pain, chest pain, shortness of breath.  Objective: General: No acute distress, AAOx3  DP/PT pulses palpable 2/4, CRT < 3 sec to all digits.  Protective sensation intact. Motor function intact.  RIGHT foot: Incision is well coapted without any evidence of dehiscence, gastrocnemius recession site.  On the plantar medial aspect of the foot is a wound measuring about the same today at today 0.5 x 0.4 x 0.1 cm there is no probing, undermining or tunneling.  The wound base is granular.  There is no surrounding erythema, ascending cellulitis there is no fluctuation crepitation.  There is no malodor. No other open lesions or pre-ulcerative lesions.  No pain with calf compression, swelling, warmth, erythema.   Assessment and Plan:  Status post right foot gastrocnemius recession, doing well with no complications   -Treatment options discussed including all alternatives, risks, and complications -I lightly debrided the wound today but there is no significant hyperkeratotic tissue.  The wound base is granular.  Continue daily dressing changes.  On her continue wearing the CAM boot and we discussed being nonweightbearing as well.  I also dispensed a Darco wedge shoe to see if this will help take pressure off the forefoot.  [name] DPM

## 2020-04-14 ENCOUNTER — Telehealth: Payer: Self-pay | Admitting: Family Medicine

## 2020-04-14 NOTE — Telephone Encounter (Signed)
Pt called after hours line Saturday 12/25 with UTI symptoms  Has history of UTI No fever, no abd pain Dysuria, odor Given Bactrim DS x 7 days

## 2020-04-21 IMAGING — MR MR CERVICAL SPINE WO/W CM
6 of 9 series · 27 of 48 positions shown · IV contrast (multihance)
Comparison: MRI 12/07/2016

CLINICAL DATA: Neck and right arm pain for 3 months. Bilateral hand
numbness. History of cervical spine fusion in January 2017.

EXAM:
MRI CERVICAL SPINE WITHOUT AND WITH CONTRAST
TECHNIQUE: Multiplanar and multiecho pulse sequences of the cervical spine, to
include the craniocervical junction and cervicothoracic junction,
were obtained without and with intravenous contrast.
CONTRAST:  20mL MULTIHANCE GADOBENATE DIMEGLUMINE 529 MG/ML IV SOLN

[Series 3: T1 · sagittal · 3.0mm · 0.41mm/px · 3 of 17 slices shown (1 of 2)]
[im 1/17]
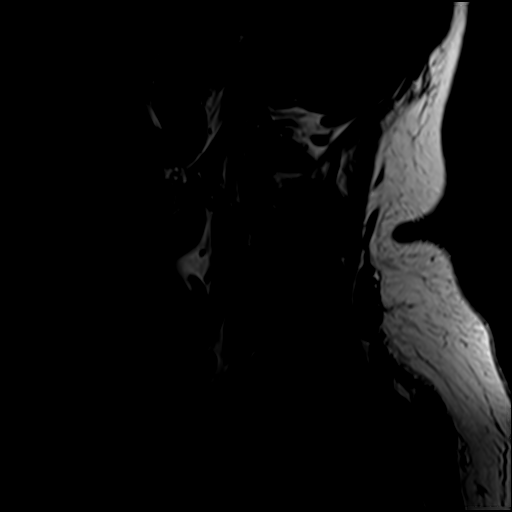
[im 9/17]
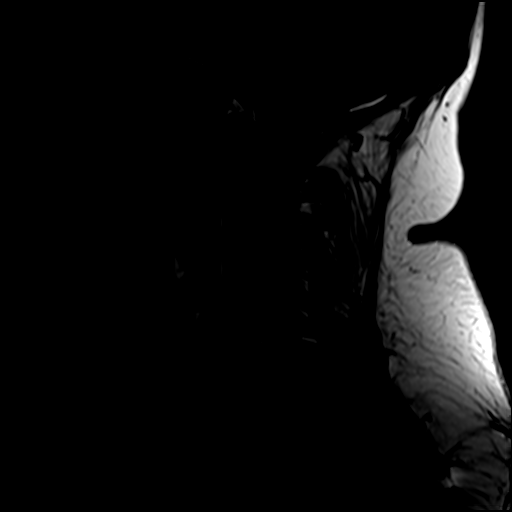
[im 17/17]
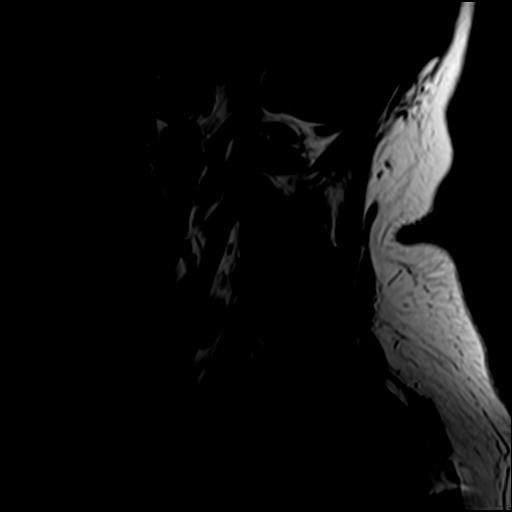

[Series 4: STIR · sagittal · 3.0mm · 0.82mm/px · 1 of 17 slices shown]
[im 1/17]
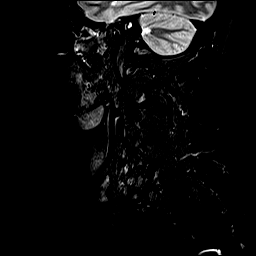

[Series 6: T2 · axial · 3.0mm · 0.70mm/px · z∈[-78,+54]mm · 7 of 36 slices shown (1 of 2)]
[im 1/36]
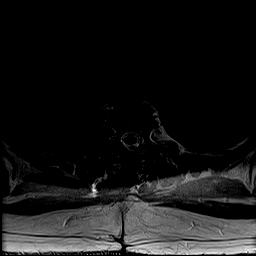
[im 6/36]
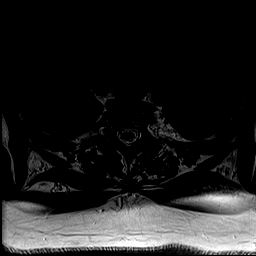
[im 12/36]
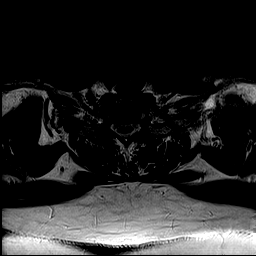
[im 18/36]
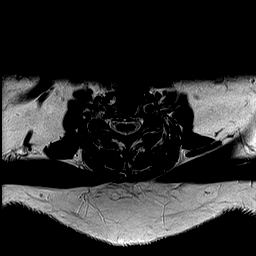
[im 24/36]
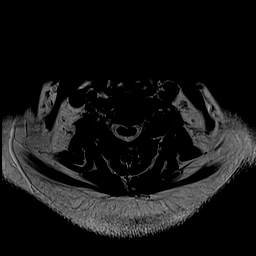
[im 30/36]
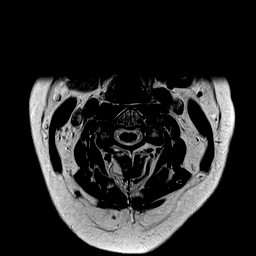
[im 36/36]
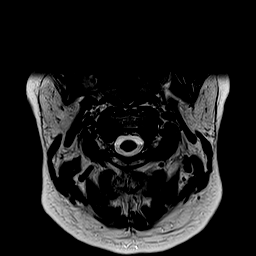

[Series 7: T1 · axial · 3.0mm · 0.35mm/px · z∈[-78,+54]mm · 8 of 36 slices shown (2 of 2)]
[im 1/36]
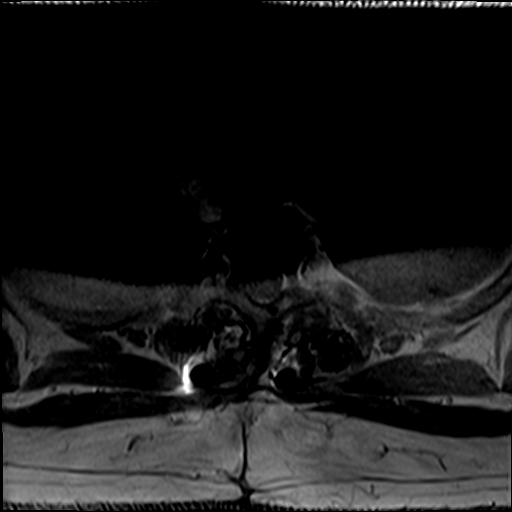
[im 6/36]
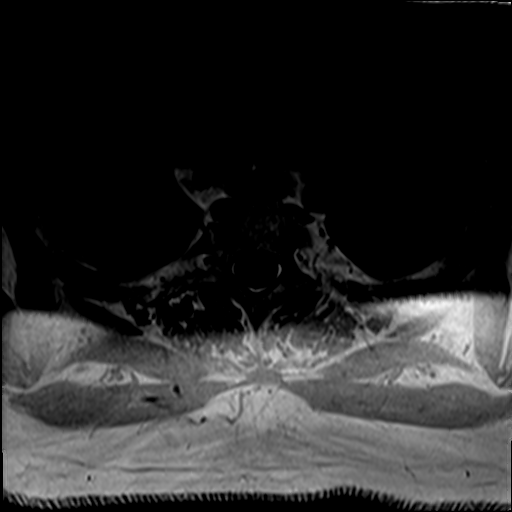
[im 11/36]
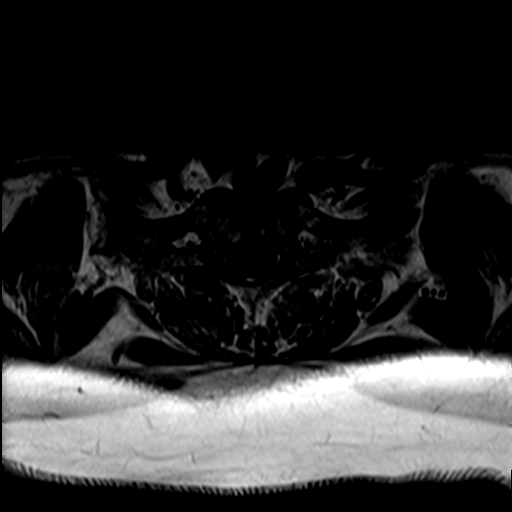
[im 16/36]
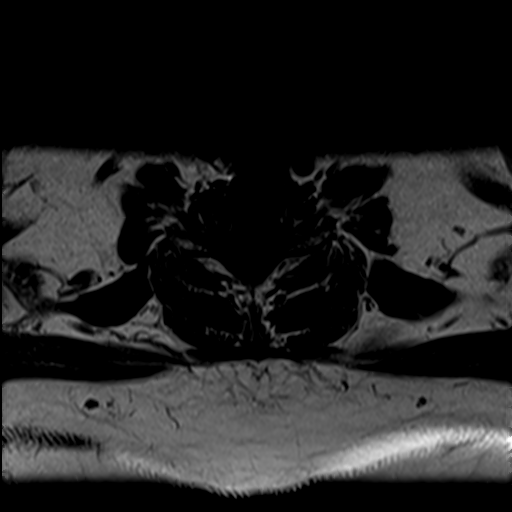
[im 21/36]
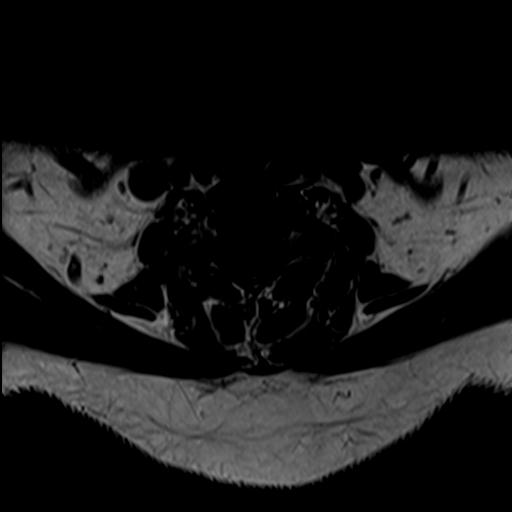
[im 26/36]
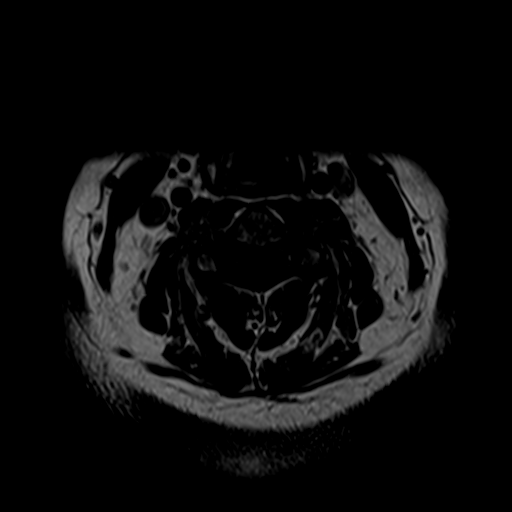
[im 31/36]
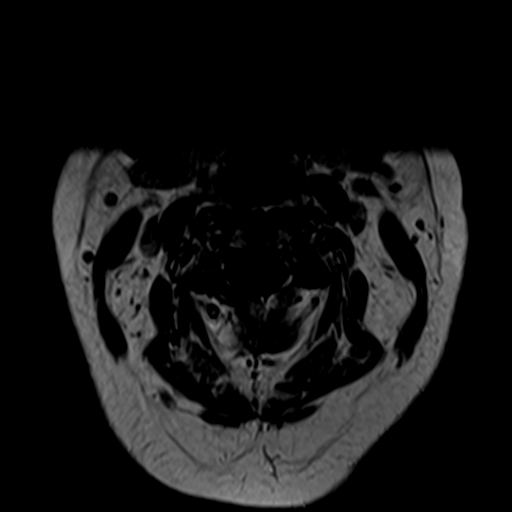
[im 36/36]
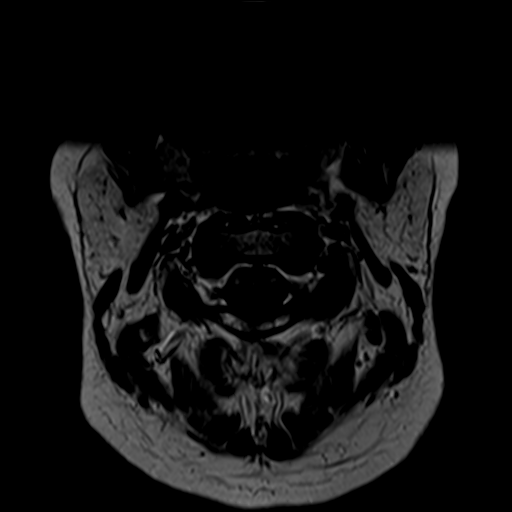

[Series 8: T2 · sagittal · 3.0mm · 0.41mm/px · 4 of 17 slices shown (2 of 2)]
[im 1/17]
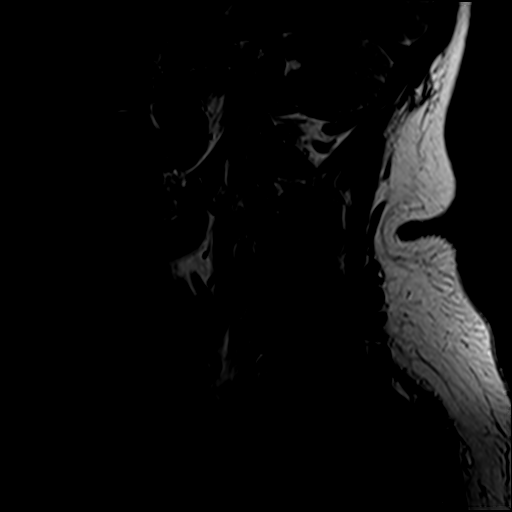
[im 6/17]
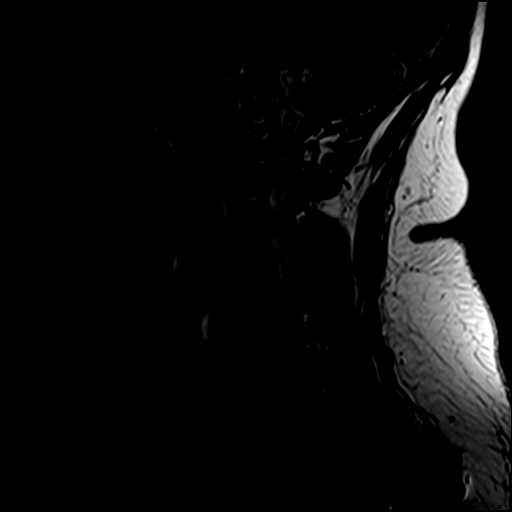
[im 11/17]
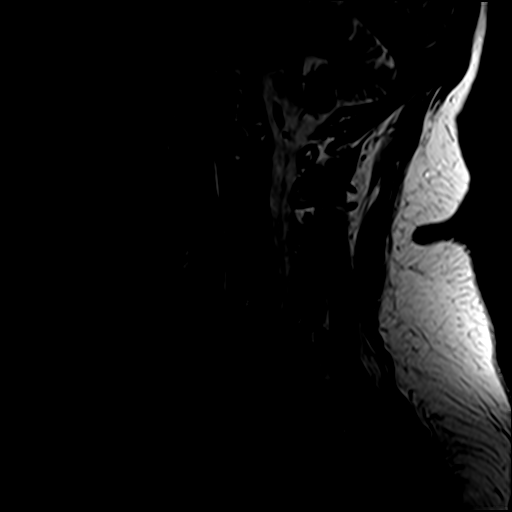
[im 17/17]
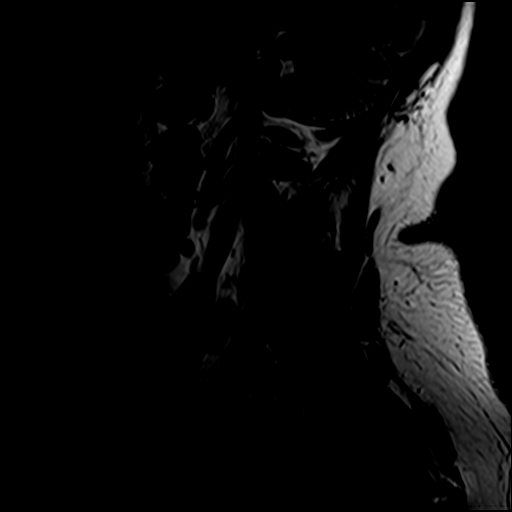

[Series 9: T1 fat-sat post-contrast · sagittal · 3.0mm · 0.82mm/px · 4 of 17 slices shown]
[im 1/17]
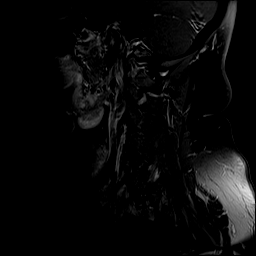
[im 6/17]
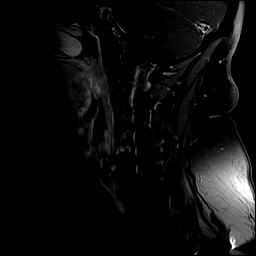
[im 11/17]
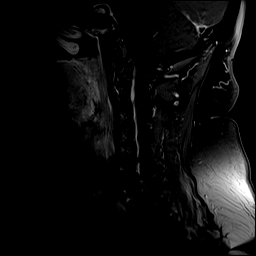
[im 17/17]
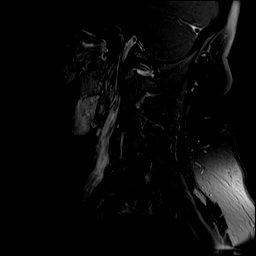

[27 of 48 positions shown; findings below may reference images not displayed]

FINDINGS: Alignment: Normal

Vertebrae: Normal marrow signal. No bone lesions or fractures. Some
artifact associated with the anterior fusion hardware at C5-6 and
C6-7.

Cord: Normal cord signal intensity.  No cord lesions or syrinx.

Posterior Fossa, vertebral arteries, paraspinal tissues: No
significant findings.

Disc levels:

C2-3: No significant findings.

C3-4: No significant findings.

C4-5: Small central disc protrusion with mild focal impression on
the ventral thecal sac. Minimal left uncinate spurring changes but
no significant foraminal stenosis.

C5-6 anterior and interbody fusion changes. Mild osteophytic ridging
posteriorly with slight flattening of the ventral thecal sac but no
significant spinal or foraminal stenosis.

C6-7: Mild osteophytic ridging and slight uncinate spurring changes.
There is mild flattening of the ventral thecal sac and mild
narrowing the ventral CSF space. No significant spinal or foraminal
stenosis. Mild foraminal encroachment, left greater than right.

C7-T1: Shallow central disc protrusion with minimal impression on
the ventral thecal sac. No foraminal stenosis.
IMPRESSION: 1. Small central disc protrusion at C4-5, appears relatively stable.
2. Shallow disc protrusion at C7-T1, appears stable.
3. Postoperative changes at C5-6 and C6-7 with posterior osteophytic
ridging and mild uncinate spurring but no significant spinal or
foraminal stenosis.

## 2020-04-25 ENCOUNTER — Ambulatory Visit: Payer: 59 | Admitting: Podiatry

## 2020-04-28 ENCOUNTER — Other Ambulatory Visit: Payer: Self-pay

## 2020-04-28 ENCOUNTER — Ambulatory Visit (INDEPENDENT_AMBULATORY_CARE_PROVIDER_SITE_OTHER): Payer: 59 | Admitting: Podiatry

## 2020-04-28 ENCOUNTER — Other Ambulatory Visit: Payer: Self-pay | Admitting: Cardiovascular Disease

## 2020-04-28 DIAGNOSIS — L03115 Cellulitis of right lower limb: Secondary | ICD-10-CM

## 2020-04-28 DIAGNOSIS — G629 Polyneuropathy, unspecified: Secondary | ICD-10-CM | POA: Diagnosis not present

## 2020-04-28 DIAGNOSIS — L97512 Non-pressure chronic ulcer of other part of right foot with fat layer exposed: Secondary | ICD-10-CM | POA: Diagnosis not present

## 2020-04-28 MED ORDER — CEPHALEXIN 500 MG PO CAPS: 500.0000 mg | ORAL_CAPSULE | Freq: Four times a day (QID) | ORAL | 0 refills | Status: DC

## 2020-04-28 MED ORDER — SILVER SULFADIAZINE 1 % EX CREA: 1.0000 "application " | TOPICAL_CREAM | Freq: Every day | CUTANEOUS | 0 refills | Status: DC

## 2020-04-30 ENCOUNTER — Other Ambulatory Visit: Payer: Self-pay | Admitting: Family Medicine

## 2020-05-04 NOTE — Progress Notes (Signed)
Subjective: [name] [name] is a 55 y.o. is seen today for follow evaluation of a wound in the right foot.  It looks like the wound to be somewhat deeper.  She has had some faint redness around the area but no red streaks.  She has bloody drainage but no pus.  Currently denies any fevers, chills, nausea, vomiting.  No calf pain, chest pain, shortness of breath.   Objective: General: No acute distress, AAOx3  DP/PT pulses palpable 2/4, CRT < 3 sec to all digits.  RIGHT foot: Incision is well coapted without any evidence of dehiscence, gastrocnemius recession site.  On the plantar medial aspect of the foot is a wound measuring 0.6 x 0.5 x 0.3 cm.  There is faint surrounding erythema without any ascending cellulitis peer there is no fluctuance, crepitation, malodor. No other open lesions or pre-ulcerative lesions.  No pain with calf compression, swelling, warmth, erythema.   Assessment and Plan:  Ulceration right foot  -Treatment options discussed including all alternatives, risks, and complications -Given the mild erythema will prescribe Keflex.  Continue daily dressing changes.  Discussed to continue offloading the cam boot.  Discussed total contact cast and will refer her to the wound care center at this time.  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  [name] DPM

## 2020-05-08 ENCOUNTER — Ambulatory Visit: Payer: 59 | Admitting: Podiatry

## 2020-05-20 ENCOUNTER — Other Ambulatory Visit: Payer: Self-pay

## 2020-05-20 ENCOUNTER — Ambulatory Visit (INDEPENDENT_AMBULATORY_CARE_PROVIDER_SITE_OTHER): Payer: 59 | Admitting: Podiatry

## 2020-05-20 DIAGNOSIS — G629 Polyneuropathy, unspecified: Secondary | ICD-10-CM | POA: Diagnosis not present

## 2020-05-20 DIAGNOSIS — L97512 Non-pressure chronic ulcer of other part of right foot with fat layer exposed: Secondary | ICD-10-CM

## 2020-05-21 ENCOUNTER — Telehealth: Payer: Self-pay | Admitting: Podiatry

## 2020-05-21 NOTE — Telephone Encounter (Signed)
Heather - FYI

## 2020-05-21 NOTE — Telephone Encounter (Signed)
Received a call from [employer] stating they received a form for an authorization for this pt but the patient does not have [employer] insurance.

## 2020-05-22 ENCOUNTER — Ambulatory Visit: Payer: 59 | Admitting: Podiatry

## 2020-05-27 ENCOUNTER — Other Ambulatory Visit: Payer: Self-pay

## 2020-05-27 ENCOUNTER — Ambulatory Visit (INDEPENDENT_AMBULATORY_CARE_PROVIDER_SITE_OTHER): Payer: 59 | Admitting: Podiatry

## 2020-05-27 DIAGNOSIS — G629 Polyneuropathy, unspecified: Secondary | ICD-10-CM

## 2020-05-27 DIAGNOSIS — L97512 Non-pressure chronic ulcer of other part of right foot with fat layer exposed: Secondary | ICD-10-CM

## 2020-05-27 NOTE — Progress Notes (Signed)
Subjective: [name] [name] is a 55 y.o. is seen today for follow evaluation of a wound in the right foot. States the wound is doing about the same. Denies any drainage or pus or any swelling or redness. Since I last saw her she did go to the beach for vacation. She has a appointment with the wound care center scheduled in a couple of weeks. Currently denies any fevers, chills, nausea, vomiting. No calf pain, chest pain, shortness of breath.  Objective: General: No acute distress, AAOx3  DP/PT pulses palpable 2/4, CRT < 3 sec to all digits.  RIGHT foot: Incision is well coapted without any evidence of dehiscence, gastrocnemius recession site.  On the plantar medial aspect of the foot is a wound measuring 0.6 x 0.5 x 0.3 cm. There is no significant surrounding erythema, ascending cellulitis but there is no fluctuation crepitation. There is no malodor. No other open lesions or pre-ulcerative lesions.  No pain with calf compression, swelling, warmth, erythema.   Assessment and Plan:  Ulceration right foot  -Treatment options discussed including all alternatives, risks, and complications -Overall the wound is doing the same. I do recommend total contact cast. All of the supplies today I did place her back into a cam boot. Surgical shoe seems to be putting pressure on the area. Also encourage her to try to stay off of the foot as much as possible and elevate. Monitor closely for signs or symptoms of infection. -Follow-up on Thursday possible total contact cast  [name] DPM

## 2020-05-27 NOTE — Patient Instructions (Signed)
Follow up with the wound care center as scheduled. If you need anything, please let me know

## 2020-06-02 ENCOUNTER — Encounter (HOSPITAL_BASED_OUTPATIENT_CLINIC_OR_DEPARTMENT_OTHER): Payer: 59 | Attending: Internal Medicine | Admitting: Internal Medicine

## 2020-06-02 ENCOUNTER — Other Ambulatory Visit: Payer: Self-pay

## 2020-06-02 DIAGNOSIS — L97512 Non-pressure chronic ulcer of other part of right foot with fat layer exposed: Secondary | ICD-10-CM | POA: Insufficient documentation

## 2020-06-02 DIAGNOSIS — L03115 Cellulitis of right lower limb: Secondary | ICD-10-CM | POA: Diagnosis not present

## 2020-06-02 DIAGNOSIS — G9009 Other idiopathic peripheral autonomic neuropathy: Secondary | ICD-10-CM | POA: Diagnosis not present

## 2020-06-02 NOTE — Progress Notes (Signed)
Subjective: [name] [name] is a 55 y.o. is seen today for follow evaluation of a wound in the right foot. States the wound is doing about the same.  She states that we put her into the last appointment is slight and she does not like it.  Otherwise she is to do about the same.  Denies any swelling or redness.  Denies any drainage or pus.  No fevers, chills, nausea, vomiting.  No calf pain, chest pain, shortness of breath.  Objective: General: No acute distress, AAOx3  DP/PT pulses palpable 2/4, CRT < 3 sec to all digits.  RIGHT foot: Incision is well coapted without any evidence of dehiscence, gastrocnemius recession site.  On the plantar medial aspect of the foot is a wound measuring 0.6 x 0.5 x 0.2 cm.  There is no surrounding erythema, ascending cellulitis peer there is no fluctuation, capitation, malodor. No other open lesions or pre-ulcerative lesions.  No pain with calf compression, swelling, warmth, erythema.   Assessment and Plan:  Ulceration right foot  -Treatment options discussed including all alternatives, risks, and complications -Overall the wound is doing the same.  She has a follow-up scheduled for wound care on Monday.  Continue with daily dressing changes and offloading.  We did further modify her boot for further offloading.  Discussed total contact cast but will defer to the wound care center at this point.  [name] DPM

## 2020-06-02 NOTE — Progress Notes (Signed)
JIMESHA, RISING ([id number]) Visit Report for 06/02/2020 Chief Complaint Document Details Patient Name: Date of Service: [name] [name], [name] [name] 06/02/2020 10:30 A M Medical Record Number: [id number] Patient Account Number: [id number] Date of Birth/Sex: Treating RN: 31-Jul-1965 (55 y.o. [name] Primary Care Provider: Jenna Luo Other Clinician: Referring Provider: Treating Provider/Extender: Rosezena Sensor in Treatment: 0 Information Obtained from: Patient Chief Complaint 06/03/2019; patient is here for review of a wound on her plantar foot transmetatarsal amputation site at roughly the third met head Electronic Signature(s) Signed: 06/02/2020 5:51:45 PM By: Linton Ham MD Entered By: Linton Ham on 06/02/2020 12:02:43 -------------------------------------------------------------------------------- Debridement Details Patient Name: Date of Service: [name] Shutter D. 06/02/2020 10:30 A M Medical Record Number: [id number] Patient Account Number: [id number] Date of Birth/Sex: Treating RN: 29-Nov-1965 (55 y.o. [name] Primary Care Provider: Jenna Luo Other Clinician: Referring Provider: Treating Provider/Extender: Rosezena Sensor in Treatment: 0 Debridement Performed for Assessment: Wound #1 Right,Plantar Amputation Site - Transmetatarsal Performed By: Physician Ricard Dillon., MD Debridement Type: Debridement Level of Consciousness (Pre-procedure): Awake and Alert Pre-procedure Verification/Time Out Yes - 11:34 Taken: Start Time: 11:34 Pain Control: Other : Benzocaine 20% T Area Debrided (L x W): otal 1 (cm) x 1 (cm) = 1 (cm) Tissue and other material debrided: Viable, Non-Viable, Callus, Slough, Subcutaneous, Slough Level: Skin/Subcutaneous Tissue Debridement Description: Excisional Instrument: Curette Bleeding: Moderate Hemostasis Achieved: Silver Nitrate End Time: 11:35 Procedural Pain:  0 Post Procedural Pain: 0 Response to Treatment: Procedure was tolerated well Level of Consciousness (Post- Awake and Alert procedure): Post Debridement Measurements of Total Wound Length: (cm) 1 Width: (cm) 1 Depth: (cm) 0.3 Volume: (cm) 0.236 Character of Wound/Ulcer Post Debridement: Improved Post Procedure Diagnosis Same as Pre-procedure Electronic Signature(s) Signed: 06/02/2020 5:50:50 PM By: Levan Hurst RN, BSN Signed: 06/02/2020 5:51:45 PM By: Linton Ham MD Entered By: Linton Ham on 06/02/2020 12:01:33 -------------------------------------------------------------------------------- HPI Details Patient Name: Date of Service: [name] Shutter D. 06/02/2020 10:30 A M Medical Record Number: [id number] Patient Account Number: [id number] Date of Birth/Sex: Treating RN: [birth year] (54 y.o. [name] Primary Care Provider: Jenna Luo Other Clinician: Referring Provider: Treating Provider/Extender: Rosezena Sensor in Treatment: 0 History of Present Illness HPI Description: ADMISSION 06/02/2020 This is a 55 year old woman who has had problems with wounds on her predominantly plantar right foot for quite a period of time. 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 More recently she has had an area developed in the right mid foot at roughly the third metatarsal head. She says this started as a blister of November. Its not been closing. More recently she has been using Silvadene cream she has a cam boot to offload. I note she walks with a cane The patient has had arterial studies done in 2019 at which time her ABI in the right was 1.28 with triphasic waveforms noncompressible on the left with triphasic waveform. Her ABI in our clinic today was 1.3 on the right. 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 She had a foot gastrocnemius recession sometime in the fall 2021 Electronic Signature(s) Signed: 06/02/2020 5:51:45 PM By: Linton Ham MD Entered By: Linton Ham on 06/02/2020 12:08:22 -------------------------------------------------------------------------------- Physical Exam Details Patient Name: Date of Service: [name] Shutter  D. 06/02/2020 10:30 A M Medical Record Number: [id number] Patient Account Number: [id number] Date of Birth/Sex: Treating RN: 1966/01/10 (55 y.o. [name] Primary Care Provider: Jenna Luo Other Clinician: Referring Provider: Treating Provider/Extender: Rosezena Sensor in Treatment: 0 Constitutional Sitting or standing Blood Pressure is within target range for patient.. Pulse regular and within target range for patient.Marland Kitchen Respirations regular, non-labored and within target range.. Temperature is normal and within the target range for the patient.Marland Kitchen Appears in no distress. Cardiovascular Pulses are easily palpable.. Neurological Neuropathy on the right foot up to just above the ankle. Absolutely no vibration sense. Decreased monofilament. Notes Wound exam; circular wound over roughly the third met head. There was undermining circumferentially I used pickups and a #15 scalpel to remove this. Fibrinous debris on the surface which I also removed. Pneumostasis with silver nitrate and a pressure dressing. There is no evidence of infection this does not probe to bone Electronic Signature(s) Signed: 06/02/2020 5:51:45 PM By: Linton Ham MD Entered By: Linton Ham on 06/02/2020 12:06:14 -------------------------------------------------------------------------------- Physician Orders Details Patient Name: Date of Service: [name] Shutter D. 06/02/2020 10:30 A M Medical Record Number: [id number] Patient Account Number: [id number] Date  of Birth/Sex: Treating RN: 29-Jun-1965 (55 y.o. [name] Primary Care Provider: Jenna Luo Other Clinician: Referring Provider: Treating Provider/Extender: Rosezena Sensor in Treatment: 0 Verbal / Phone Orders: No Diagnosis Coding Follow-up Appointments Return Appointment in 1 week. Bathing/ Shower/ Hygiene May shower with protection but do not get wound dressing(s) wet. May shower and wash wound with soap and water. - on days that dressing is changed Off-Loading Removable cast walker boot to: - right foot Wound Treatment Wound #1 - Amputation Site - Transmetatarsal Wound Laterality: Plantar, Right Cleanser: Byram Ancillary Kit - 15 Day Supply (DME) (Generic) Every Other Day/15 Days Discharge Instructions: Use supplies as instructed; Kit contains: (15) Saline Bullets; (15) 3x3 Gauze; 15 pr Gloves Prim Dressing: Promogran Prisma Matrix, 4.34 (sq in) (silver collagen) (DME) (Generic) Every Other Day/15 Days ary Discharge Instructions: Moisten collagen with saline or hydrogel (KY jelly) Secondary Dressing: Woven Gauze Sponges 2x2 in (DME) (Generic) Every Other Day/15 Days Discharge Instructions: Apply over primary dressing as directed. Secondary Dressing: Optifoam Non-Adhesive Dressing, 4x4 in (DME) (Generic) Every Other Day/15 Days Discharge Instructions: foam donut for offloading Secured With: Kerlix Roll Sterile, 4.5x3.1 (in/yd) (DME) (Generic) Every Other Day/15 Days Discharge Instructions: Secure with Kerlix as directed. Secured With: Paper Tape, 2x10 (in/yd) (DME) (Generic) Every Other Day/15 Days Discharge Instructions: Secure dressing with tape as directed. Electronic Signature(s) Signed: 06/02/2020 5:50:50 PM By: Levan Hurst RN, BSN Signed: 06/02/2020 5:51:45 PM By: Linton Ham MD Entered By: Levan Hurst on 06/02/2020 11:40:12 -------------------------------------------------------------------------------- Problem List  Details Patient Name: Date of Service: [name] Shutter D. 06/02/2020 10:30 A M Medical Record Number: [id number] Patient Account Number: [id number] Date of Birth/Sex: Treating RN: 07-07-1965 (55 y.o. [name] Primary Care Provider: Other Clinician: Jenna Luo Referring Provider: Treating Provider/Extender: Rosezena Sensor in Treatment: 0 Active Problems ICD-10 Encounter Code Description Active Date MDM Diagnosis L97.512 Non-pressure chronic ulcer of other part of right foot with fat layer exposed 06/02/2020 No Yes G90.09 Other idiopathic peripheral autonomic neuropathy 06/02/2020 No Yes Inactive Problems Resolved Problems Electronic Signature(s) Signed: 06/02/2020 5:51:45 PM By: Linton Ham MD Entered By: Linton Ham on 06/02/2020 12:00:35 -------------------------------------------------------------------------------- Progress Note Details Patient Name: Date of Service: [name] Shutter D. 06/02/2020 10:30 A M Medical Record Number: [id number] Patient Account Number: [id number]  Date of Birth/Sex: Treating RN: [birth year] (55 y.o. [name] Primary Care Provider: Jenna Luo Other Clinician: Referring Provider: Treating Provider/Extender: Rosezena Sensor in Treatment: 0 Subjective Chief Complaint Information obtained from Patient 06/03/2019; patient is here for review of a wound on her plantar foot transmetatarsal amputation site at roughly the third met head History of Present Illness (HPI) ADMISSION 06/02/2020 This is a 55 year old woman who has had problems with wounds on her predominantly plantar right foot for quite a period of time. 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 More recently she has had an area developed in the right mid foot at roughly the third metatarsal head. She says this  started as a blister of November. Its not been closing. More recently she has been using Silvadene cream she has a cam boot to offload. I note she walks with a cane The patient has had arterial studies done in 2019 at which time her ABI in the right was 1.28 with triphasic waveforms noncompressible on the left with triphasic waveform. Her ABI in our clinic today was 1.3 on the right. 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 Patient History Information obtained from Patient. Allergies codeine, tetracycline, penicillin, tramadol, estradiol (Reaction: allergic to the patches), ciprofloxacin, phenazopyridine HCl Family History Cancer - Paternal Grandparents, Diabetes - Mother,Maternal Grandparents, Heart Disease - Mother,Paternal Grandparents,Maternal Grandparents,Father,Siblings, Hypertension - Mother,Father, Lung Disease - Mother, No family history of Hereditary Spherocytosis, Kidney Disease, Seizures, Stroke, Thyroid Problems, Tuberculosis. Social History Never smoker, Marital Status - Widowed, Alcohol Use - Never, Drug Use - Prior History, Caffeine Use - Daily - soda's. Medical History Eyes Denies history of Cataracts, Glaucoma, Optic Neuritis Ear/Nose/Mouth/Throat Denies history of Chronic sinus problems/congestion, Middle ear problems Hematologic/Lymphatic Denies history of Anemia, Hemophilia, Human Immunodeficiency Virus, Lymphedema, Sickle Cell Disease Respiratory Patient has history of Asthma, Sleep Apnea Denies history of Aspiration, Chronic Obstructive Pulmonary Disease (COPD), Pneumothorax, Tuberculosis Cardiovascular Patient has history of Hypertension Denies history of Angina, Arrhythmia, Congestive Heart Failure, Coronary Artery Disease, Deep Vein Thrombosis, Hypotension, Myocardial Infarction, Peripheral Arterial Disease, Peripheral Venous Disease, Phlebitis,  Vasculitis Gastrointestinal Denies history of Cirrhosis , Colitis, Crohnoos, Hepatitis A, Hepatitis B, Hepatitis C Endocrine Denies history of Type I Diabetes, Type II Diabetes Genitourinary Denies history of End Stage Renal Disease Immunological Denies history of Lupus Erythematosus, Raynaudoos, Scleroderma Integumentary (Skin) Denies history of History of Burn Musculoskeletal Patient has history of Osteoarthritis Denies history of Gout, Rheumatoid Arthritis, Osteomyelitis Neurologic Patient has history of Neuropathy, 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 - left 4th toe amp. - neck surgery. - spine surgery. - abdom. hysterectomy. - appendectomy. - lap. knee surgery. Medical A Surgical History Notes nd Gastrointestinal Hx of GERD Psychiatric hx of bipolar 1 disorder, anxiety, depression. Review of Systems (ROS) Constitutional Symptoms (General Health) Denies complaints or symptoms of Fatigue, Fever, Chills, Marked Weight Change. Eyes Complains or has symptoms of Glasses / Contacts. Denies complaints or symptoms of Dry Eyes, Vision Changes. Ear/Nose/Mouth/Throat Denies complaints or symptoms of Chronic sinus problems or rhinitis. Respiratory Denies complaints or symptoms of Chronic or  frequent coughs, Shortness of Breath. Cardiovascular Denies complaints or symptoms of Chest pain. Gastrointestinal Denies complaints or symptoms of Frequent diarrhea, Nausea, Vomiting. Endocrine Denies complaints or symptoms of Heat/cold intolerance. Genitourinary Denies complaints or symptoms of Frequent urination. Integumentary (Skin) Complains or has symptoms of Wounds. Musculoskeletal Denies complaints or symptoms of Muscle  Pain, Muscle Weakness. Neurologic Denies complaints or symptoms of Numbness/parasthesias. Psychiatric Denies complaints or symptoms of Claustrophobia, Suicidal. Objective Constitutional Sitting or standing Blood Pressure is within target range for patient.. Pulse regular and within target range for patient.Marland Kitchen Respirations regular, non-labored and within target range.. Temperature is normal and within the target range for the patient.Marland Kitchen Appears in no distress. Vitals Time Taken: 10:19 AM, Height: 64 in, Source: Stated, Weight: 228 lbs, Source: Stated, BMI: 39.1, Temperature: 97.9 F, Pulse: 92 bpm, Respiratory Rate: 17 breaths/min, Blood Pressure: 137/90 mmHg. Cardiovascular Pulses are easily palpable.. Neurological Neuropathy on the right foot up to just above the ankle. Absolutely no vibration sense. Decreased monofilament. General Notes: Wound exam; circular wound over roughly the third met head. There was undermining circumferentially I used pickups and a #15 scalpel to remove this. Fibrinous debris on the surface which I also removed. Pneumostasis with silver nitrate and a pressure dressing. There is no evidence of infection this does not probe to bone Integumentary (Hair, Skin) Wound #1 status is Open. Original cause of wound was Gradually Appeared. The wound is located on the Right,Plantar Amputation Site - Transmetatarsal. The wound measures 1cm length x 1cm width x 0.3cm depth; 0.785cm^2 area and 0.236cm^3 volume. There is no tunneling or undermining noted. There is a medium amount of serosanguineous drainage noted. The wound margin is distinct with the outline attached to the wound base. There is large (67-100%) red, pink granulation within the wound bed. There is no necrotic tissue within the wound bed. Assessment Active Problems ICD-10 Non-pressure chronic ulcer of other part of right foot with fat layer exposed Other idiopathic peripheral autonomic neuropathy Procedures Wound  #1 Pre-procedure diagnosis of Wound #1 is a Neuropathic Ulcer-Non Diabetic located on the Right,Plantar Amputation Site - Transmetatarsal . There was a Excisional Skin/Subcutaneous Tissue Debridement with a total area of 1 sq cm performed by Ricard Dillon., MD. With the following instrument(s): Curette to remove Viable and Non-Viable tissue/material. Material removed includes Callus, Subcutaneous Tissue, and Slough after achieving pain control using Other (Benzocaine 20%). No specimens were taken. A time out was conducted at 11:34, prior to the start of the procedure. A Moderate amount of bleeding was controlled with Silver Nitrate. The procedure was tolerated well with a pain level of 0 throughout and a pain level of 0 following the procedure. Post Debridement Measurements: 1cm length x 1cm width x 0.3cm depth; 0.236cm^3 volume. Character of Wound/Ulcer Post Debridement is improved. Post procedure Diagnosis Wound #1: Same as Pre-Procedure Plan Follow-up Appointments: Return Appointment in 1 week. Bathing/ Shower/ Hygiene: May shower with protection but do not get wound dressing(s) wet. May shower and wash wound with soap and water. - on days that dressing is changed Off-Loading: Removable cast walker boot to: - right foot WOUND #1: - Amputation Site - Transmetatarsal Wound Laterality: Plantar, Right Cleanser: Byram Ancillary Kit - 15 Day Supply (DME) (Generic) Every Other Day/15 Days Discharge Instructions: Use supplies as instructed; Kit contains: (15) Saline Bullets; (15) 3x3 Gauze; 15 pr Gloves Prim Dressing: Promogran Prisma Matrix, 4.34 (sq in) (silver collagen) (DME) (Generic) Every Other Day/15 Days ary Discharge Instructions: Moisten collagen with saline or hydrogel (  KY jelly) Secondary Dressing: Woven Gauze Sponges 2x2 in (DME) (Generic) Every Other Day/15 Days Discharge Instructions: Apply over primary dressing as directed. Secondary Dressing: Optifoam Non-Adhesive Dressing,  4x4 in (DME) (Generic) Every Other Day/15 Days Discharge Instructions: foam donut for offloading Secured With: Kerlix Roll Sterile, 4.5x3.1 (in/yd) (DME) (Generic) Every Other Day/15 Days Discharge Instructions: Secure with Kerlix as directed. Secured With: Paper T ape, 2x10 (in/yd) (DME) (Generic) Every Other Day/15 Days Discharge Instructions: Secure dressing with tape as directed. 1. We use silver collagen, gauze and continue to her in her cam boot 2. I had like to see how this responds. If there is no response in a week or 2 I will put her in a total contact cast as was suggested by Dr. Earleen Newport. I have some concerns about her balance walking with a cane however she says she thinks she will be able to tolerate it 3. I saw no evidence of infection no cultures were done this did not probe to bone. 4. I will need to look back and see when this was last x-rayed I spent 35 minutes in review of this patient's past medical history, face-to-face evaluation and preparation of this record Electronic Signature(s) Signed: 06/02/2020 5:51:45 PM By: Linton Ham MD Signed: 06/02/2020 5:51:45 PM By: Linton Ham MD Entered By: Linton Ham on 06/02/2020 12:07:43 -------------------------------------------------------------------------------- HxROS Details Patient Name: Date of Service: [name] Shutter D. 06/02/2020 10:30 A M Medical Record Number: [id number] Patient Account Number: [id number] Date of Birth/Sex: Treating RN: 1966-01-04 (55 y.o. Tonita Phoenix, Lauren Primary Care Provider: Jenna Luo Other Clinician: Referring Provider: Treating Provider/Extender: Rosezena Sensor in Treatment: 0 Information Obtained From Patient Constitutional Symptoms (General Health) Complaints and Symptoms: Negative for: Fatigue; Fever; Chills; Marked Weight Change Eyes Complaints and Symptoms: Positive for: Glasses / Contacts Negative for: Dry Eyes; Vision Changes Medical  History: Negative for: Cataracts; Glaucoma; Optic Neuritis Ear/Nose/Mouth/Throat Complaints and Symptoms: Negative for: Chronic sinus problems or rhinitis Medical History: Negative for: Chronic sinus problems/congestion; Middle ear problems Respiratory Complaints and Symptoms: Negative for: Chronic or frequent coughs; Shortness of Breath Medical History: Positive for: Asthma; Sleep Apnea Negative for: Aspiration; Chronic Obstructive Pulmonary Disease (COPD); Pneumothorax; Tuberculosis Cardiovascular Complaints and Symptoms: Negative for: Chest pain Medical History: Positive for: Hypertension Negative for: Angina; Arrhythmia; Congestive Heart Failure; Coronary Artery Disease; Deep Vein Thrombosis; Hypotension; Myocardial Infarction; Peripheral Arterial Disease; Peripheral Venous Disease; Phlebitis; Vasculitis Gastrointestinal Complaints and Symptoms: Negative for: Frequent diarrhea; Nausea; Vomiting Medical History: Negative for: Cirrhosis ; Colitis; Crohns; Hepatitis A; Hepatitis B; Hepatitis C Past Medical History Notes: Hx of GERD Endocrine Complaints and Symptoms: Negative for: Heat/cold intolerance Medical History: Negative for: Type I Diabetes; Type II Diabetes Genitourinary Complaints and Symptoms: Negative for: Frequent urination Medical History: Negative for: End Stage Renal Disease Integumentary (Skin) Complaints and Symptoms: Positive for: Wounds Medical History: Negative for: History of Burn Musculoskeletal Complaints and Symptoms: Negative for: Muscle Pain; Muscle Weakness Medical History: Positive for: Osteoarthritis Negative for: Gout; Rheumatoid Arthritis; Osteomyelitis Neurologic Complaints and Symptoms: Negative for: Numbness/parasthesias Medical History: Positive for: Neuropathy; Seizure Disorder - hx of seizures r/t tramadol Negative for: Dementia; Quadriplegia; Paraplegia Psychiatric Complaints and Symptoms: Negative for: Claustrophobia;  Suicidal Medical History: Negative for: Anorexia/bulimia; Confinement Anxiety Past Medical History Notes: hx of bipolar 1 disorder, anxiety, depression. Hematologic/Lymphatic Medical History: Negative for: Anemia; Hemophilia; Human Immunodeficiency Virus; Lymphedema; Sickle Cell Disease Immunological Medical History: Negative for: Lupus Erythematosus; Raynauds; Scleroderma Oncologic Medical History: Negative for: Received Chemotherapy; Received Radiation Immunizations  Pneumococcal Vaccine: Received Pneumococcal Vaccination: Yes Immunization Notes: pt. doesn't remember last tetanus shot Implantable Devices None Hospitalization / Surgery History Type of Hospitalization/Surgery trasmet amp right foot left 4th toe amp neck surgery spine surgery abdom. hysterectomy appendectomy lap. knee surgery Family and Social History Cancer: Yes - Paternal Grandparents; Diabetes: Yes - Mother,Maternal Grandparents; Heart Disease: Yes - Mother,Paternal Grandparents,Maternal Grandparents,Father,Siblings; Hereditary Spherocytosis: No; Hypertension: Yes - Mother,Father; Kidney Disease: No; Lung Disease: Yes - Mother; Seizures: No; Stroke: No; Thyroid Problems: No; Tuberculosis: No; Never smoker; Marital Status - Widowed; Alcohol Use: Never; Drug Use: Prior History; Caffeine Use: Daily - soda's; Financial Concerns: No; Food, Clothing or Shelter Needs: No; Support System Lacking: No; Transportation Concerns: No Electronic Signature(s) Signed: 06/02/2020 5:09:26 PM By: Rhae Hammock RN Signed: 06/02/2020 5:51:45 PM By: Linton Ham MD Entered By: Rhae Hammock on 06/02/2020 10:29:31 -------------------------------------------------------------------------------- SuperBill Details Patient Name: Date of Service: [name] [name] 06/02/2020 Medical Record Number: [id number] Patient Account Number: [id number] Date of Birth/Sex: Treating RN: 1965-12-10 (55 y.o. [name] Primary  Care Provider: Jenna Luo Other Clinician: Referring Provider: Treating Provider/Extender: Rosezena Sensor in Treatment: 0 Diagnosis Coding ICD-10 Codes Code Description [phone] Non-pressure chronic ulcer of other part of right foot with fat layer exposed G90.09 Other idiopathic peripheral autonomic neuropathy Facility Procedures CPT4 Code: [id number] Description: 57903 - WOUND CARE VISIT-LEV 3 EST PT Modifier: 25 Quantity: 1 CPT4 Code: [id number] Description: 19166 - DEB SUBQ TISSUE 20 SQ CM/< ICD-10 Diagnosis Description L97.512 Non-pressure chronic ulcer of other part of right foot with fat layer exposed Modifier: Quantity: 1 Physician Procedures : CPT4 Code Description Modifier [id number] [place] PHYS LEVEL 3 NEW PT 25 ICD-10 Diagnosis Description L97.512 Non-pressure chronic ulcer of other part of right foot with fat layer exposed G90.09 Other idiopathic peripheral autonomic neuropathy Quantity: 1 : [id number] - WC PHYS SUBQ TISS 20 SQ CM ICD-10 Diagnosis Description L97.512 Non-pressure chronic ulcer of other part of right foot with fat layer exposed Quantity: 1 Electronic Signature(s) Signed: 06/02/2020 5:50:50 PM By: Levan Hurst RN, BSN Signed: 06/02/2020 5:51:45 PM By: Linton Ham MD Entered By: Levan Hurst on 06/02/2020 12:42:23

## 2020-06-02 NOTE — Progress Notes (Signed)
[name] [name], [name] [name] ([id number]) Visit Report for 06/02/2020 Allergy List Details Patient Name: Date of Service: [name] [name], [name] [name] 06/02/2020 10:30 A M Medical Record Number: [id number] Patient Account Number: [id number] Date of Birth/Sex: Treating RN: 06-06-1965 (55 y.o. [name] [name], [name] [name] Primary [name] [name] [name]: [name] [name] Other Clinician: Referring Estell Dillinger: Treating [name] [name]: [name] [name]: 0 Allergies Active Allergies codeine Type: Food tetracycline Type: Food penicillin Type: Food tramadol Type: Food estradiol Reaction: allergic to the patches Type: Food ciprofloxacin Type: Food phenazopyridine HCl Type: Food Allergy Notes Electronic Signature(s) Signed: 06/02/2020 5:09:26 PM By: Rhae Hammock RN Entered By: Rhae Hammock on 06/02/2020 10:20:42 -------------------------------------------------------------------------------- Arrival Information Details Patient Name: Date of Service: [name] Shutter D. 06/02/2020 10:30 A M Medical Record Number: [id number] Patient Account Number: [id number] Date of Birth/Sex: Treating RN: December 21, 1965 (55 y.o. [name] [name], [name] [name] Primary [name] [name] [name]: [name] [name] Other Clinician: Referring [name] [name]: Treating [name] [name]: [name] [name]: 0 Visit Information Patient Arrived: [name] [name] Time: 10:16 Accompanied By: self Transfer Assistance: None Patient Identification Verified: Yes Secondary Verification Process Completed: Yes Patient Requires Transmission-Based Precautions: No Patient Has Alerts: No Electronic Signature(s) Signed: 06/02/2020 5:09:26 PM By: Rhae Hammock RN Entered By: Rhae Hammock on 06/02/2020 10:17:20 -------------------------------------------------------------------------------- Clinic Level of [name] Assessment Details Patient Name: Date of Service: [name] [name] 06/02/2020 10:30 A M Medical  Record Number: [id number] Patient Account Number: [id number] Date of Birth/Sex: Treating RN: November 20, 1965 (55 y.o. [name] [name] Primary [name] Iva Posten: [name] [name] Other Clinician: Referring [name] [name]: Treating [name] [name]: [name] [name]: 0 Clinic Level of [name] Assessment Items TOOL 1 Quantity Score X- 1 0 Use when EandM and Procedure is performed on INITIAL visit ASSESSMENTS - Nursing Assessment / Reassessment X- 1 20 General Physical Exam (combine w/ comprehensive assessment (listed just below) when performed on new pt. evals) X- 1 25 Comprehensive Assessment (HX, ROS, Risk Assessments, Wounds Hx, etc.) ASSESSMENTS - Wound and Skin Assessment / Reassessment []  - 0 Dermatologic / Skin Assessment (not related to wound area) ASSESSMENTS - Ostomy and/or Continence Assessment and [name] []  - 0 Incontinence Assessment and Management []  - 0 Ostomy [name] Assessment and Management (repouching, etc.) PROCESS - Coordination of [name] X - Simple Patient / Family Education for ongoing [name] 1 15 []  - 0 Complex (extensive) Patient / Family Education for ongoing [name] X- 1 10 Staff obtains Programmer, systems, Records, T Results / Process Orders est []  - 0 Staff telephones HHA, Nursing Homes / Clarify orders / etc []  - 0 Routine Transfer to another Facility (non-emergent condition) []  - 0 Routine Hospital Admission (non-emergent condition) X- 1 15 New Admissions / Biomedical engineer / Ordering NPWT Apligraf, etc. , []  - 0 Emergency Hospital Admission (emergent condition) PROCESS - Special Needs []  - 0 Pediatric / Minor Patient Management []  - 0 Isolation Patient Management []  - 0 Hearing / Language / Visual special needs []  - 0 Assessment of Community assistance (transportation, D/C planning, etc.) []  - 0 Additional assistance / Altered mentation []  - 0 Support Surface(s) Assessment (bed, cushion, seat, etc.) INTERVENTIONS -  Miscellaneous []  - 0 External ear exam []  - 0 Patient Transfer (multiple staff / Civil Service fast streamer / Similar devices) []  - 0 Simple Staple / Suture removal (25 or less) []  - 0 Complex Staple / Suture removal (26 or more) []  - 0 Hypo/Hyperglycemic Management (do not check if billed separately) X- 1 15 Ankle / Brachial Index (ABI) - do not check if billed separately Has  the patient been seen at the hospital within the last three years: Yes Total Score: 100 Level Of [name]: New/Established - Level 3 Electronic Signature(s) Signed: 06/02/2020 5:50:50 PM By: [name] Hurst RN, BSN Entered By: [name] [name] on 06/02/2020 12:42:14 -------------------------------------------------------------------------------- Encounter Discharge Information Details Patient Name: Date of Service: [name] Shutter D. 06/02/2020 10:30 A M Medical Record Number: [id number] Patient Account Number: [id number] Date of Birth/Sex: Treating RN: Jun 04, 1965 (55 y.o. [name] [name] Primary [name] Aviana Shevlin: [name] [name] Other Clinician: Referring [name] [name]: Treating [name] [name]: [name] [name]: 0 Encounter Discharge Information Items Post Procedure Vitals Discharge Condition: Stable Temperature (F): 97.9 Ambulatory Status: Cane Pulse (bpm): 92 Discharge Destination: Home Respiratory Rate (breaths/min): 17 Transportation: Private Auto Blood Pressure (mmHg): 137/90 Accompanied By: self Schedule Follow-up Appointment: Yes Clinical Summary of [name]: Electronic Signature(s) Signed: 06/02/2020 5:25:06 PM By: [name] [name] Entered By: [name] [name] on 06/02/2020 12:43:28 -------------------------------------------------------------------------------- Lower Extremity Assessment Details Patient Name: Date of Service: [name] [name], [name] [name] 06/02/2020 10:30 A M Medical Record Number: [id number] Patient Account Number: [id number] Date of Birth/Sex: Treating RN: 01/16/1966 (55 y.o. [name] [name], [name] [name] Primary [name] [name] [name]: [name] [name] Other Clinician: Referring [name] [name]: Treating [name] [name]: [name] [name]: 0 Edema Assessment Assessed: Shirlyn Goltz: No] Patrice Paradise: Yes] Edema: [Left: N] [Right: o] Calf Left: Right: Point of Measurement: 34 cm From Medial Instep 39 cm Ankle Left: Right: Point of Measurement: 9 cm From Medial Instep 23.5 cm Knee To Floor Left: Right: From Medial Instep 42 cm Vascular Assessment Pulses: Dorsalis Pedis Palpable: [Right:Yes] Posterior Tibial Palpable: [Right:Yes] Blood Pressure: Brachial: [Right:137] Ankle: [Right:Dorsalis Pedis: 178 1.30] Electronic Signature(s) Signed: 06/02/2020 5:09:26 PM By: Rhae Hammock RN Entered By: Rhae Hammock on 06/02/2020 10:58:25 -------------------------------------------------------------------------------- Multi Wound Chart Details Patient Name: Date of Service: [name] Shutter D. 06/02/2020 10:30 A M Medical Record Number: [id number] Patient Account Number: [id number] Date of Birth/Sex: Treating RN: 1966-02-27 (55 y.o. 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] [N/A:N/A] Primary Etiology: [1:Asthma, Sleep Apnea, Hypertension,] [N/A:N/A] Comorbid History: [1:Osteoarthritis, Neuropathy, Seizure Disorder 03/05/2020] [N/A:N/A] Date Acquired: [1:0] [N/A:N/A] Weeks of [name]: [1:Open] [N/A:N/A] Wound Status: [1:1x1x0.3] [N/A:N/A] Measurements L x W x D  (cm) [1:0.785] [N/A:N/A] A (cm) : rea [1:0.236] [N/A:N/A] Volume (cm) : [1:Full Thickness Without Exposed] [N/A:N/A] Classification: [1:Support Structures Medium] [N/A:N/A] Exudate Amount: [1:Serosanguineous] [N/A:N/A] Exudate Type: [1:red, brown] [N/A:N/A] Exudate Color: [1:Distinct, outline attached] [N/A:N/A] Wound Margin: [1:Large (67-100%)] [N/A:N/A] Granulation Amount: [1:Red, Pink] [N/A:N/A] Granulation Quality: [1:None Present (0%)] [N/A:N/A] Necrotic Amount: [1:Fascia: No] [N/A:N/A] Exposed Structures: [1:Fat Layer (Subcutaneous Tissue): No Tendon: No Muscle: No Joint: No Bone: No Small (1-33%)] [N/A:N/A] Epithelialization: [1:Debridement - Excisional] [N/A:N/A] Debridement: [1:11:34] [N/A:N/A] Pre-procedure Verification/Time Out Taken: [1:Other] [N/A:N/A] Pain Control: [1:Callus, Subcutaneous, Slough] [N/A:N/A] Tissue Debrided: [1:Skin/Subcutaneous Tissue] [N/A:N/A] Level: [1:1] [N/A:N/A] Debridement A (sq cm): [1:rea Curette] [N/A:N/A] Instrument: [1:Moderate] [N/A:N/A] Bleeding: [1:Silver Nitrate] [N/A:N/A] Hemostasis A chieved: [1:0] [N/A:N/A] Procedural Pain: [1:0] [N/A:N/A] Post Procedural Pain: [1:Procedure was tolerated well] [N/A:N/A] Debridement [name] Response: [1:1x1x0.3] [N/A:N/A] Post Debridement Measurements L x W x D (cm) [1:0.236] [N/A:N/A] Post Debridement Volume: (cm) [1:Debridement] [N/A:N/A] [name] Notes Electronic Signature(s) Signed: 06/02/2020 5:50:50 PM By: [name] Hurst RN, BSN Signed:  06/02/2020 5:51:45 PM By: Linton Ham MD Entered By: Linton Ham on 06/02/2020 12:01:21 -------------------------------------------------------------------------------- [place] Details Patient Name: Date of Service: [name] Shutter D. 06/02/2020 10:30 A M Medical Record Number: [id number] Patient Account Number: [id number] Date of Birth/Sex: Treating RN: 08/17/1965 (55 y.o. [name] [name] Primary [name] Tayia Stonesifer:  [name] [name] Other Clinician: Referring Odena Mcquaid: Treating Aubrea Meixner/Extender: [name] [name]: 0 Active Inactive Abuse / Safety / Falls / Self [name] Management Nursing Diagnoses: History of Falls Potential for injury related to falls Goals: Patient will not experience any injury related to falls Date Initiated: 06/02/2020 Target Resolution Date: 07/04/2020 Goal Status: Active Patient/caregiver will verbalize/demonstrate measures taken to prevent injury and/or falls Date Initiated: 06/02/2020 Target Resolution Date: 07/04/2020 Goal Status: Active Interventions: Assess Activities of Daily Living upon admission and as needed Assess fall risk on admission and as needed Assess: immobility, friction, shearing, incontinence upon admission and as needed Assess impairment of mobility on admission and as needed per policy Assess personal safety and home safety (as indicated) on admission and as needed Assess self [name] needs on admission and as needed Provide education on personal and home safety Notes: Wound/Skin Impairment Nursing Diagnoses: Impaired tissue integrity Knowledge deficit related to ulceration/compromised skin integrity Goals: Patient/caregiver will verbalize understanding of skin [name] regimen Date Initiated: 06/02/2020 Target Resolution Date: 07/04/2020 Goal Status: Active Ulcer/skin breakdown will have a volume reduction of 30% by week 4 Date Initiated: 06/02/2020 Target Resolution Date: 07/04/2020 Goal Status: Active Interventions: Assess patient/caregiver ability to obtain necessary supplies Assess patient/caregiver ability to perform ulcer/skin [name] regimen upon admission and as needed Assess ulceration(s) every visit Provide education on ulcer and skin [name] Notes: Electronic Signature(s) Signed: 06/02/2020 5:50:50 PM By: [name] Hurst RN, BSN Entered By: [name] [name] on 06/02/2020  12:41:43 -------------------------------------------------------------------------------- Pain Assessment Details Patient Name: Date of Service: [name] Shutter D. 06/02/2020 10:30 A M Medical Record Number: [id number] Patient Account Number: [id number] Date of Birth/Sex: Treating RN: 07/15/1965 (55 y.o. [name] [name], [name] [name] Primary [name] China Deitrick: [name] [name] Other Clinician: Referring Kien Mirsky: Treating Gerrett Loman/Extender: [name] [name]: 0 Active Problems Location of Pain Severity and Description of Pain Patient Has Paino Yes Site Locations Pain Location: Pain in Ulcers With Dressing Change: Yes Duration of the Pain. Constant / Intermittento Constant Rate the pain. Current Pain Level: 5 Worst Pain Level: 10 Least Pain Level: 0 Tolerable Pain Level: 7 Character of Pain Describe the Pain: Aching Pain Management and Medication Current Pain Management: Medication: Yes Cold Application: No Rest: Yes Massage: No Activity: No T.E.N.S.: No Heat Application: No Leg drop or elevation: No Is the Current Pain Management Adequate: Adequate How does your wound impact your activities of daily livingo Sleep: No Bathing: No Appetite: No Relationship With Others: No Bladder Continence: No Emotions: No Bowel Continence: No Work: No Toileting: No Drive: No Dressing: No Hobbies: No Electronic Signature(s) Signed: 06/02/2020 5:09:26 PM By: Rhae Hammock RN Entered By: Rhae Hammock on 06/02/2020 10:33:14 -------------------------------------------------------------------------------- Patient/Caregiver Education Details Patient Name: Date of Service: [name] [name] 2/14/2022andnbsp10:30 [place] Record Number: [id number] Patient Account Number: [id number] Date of Birth/Gender: Treating RN: 15-Jan-1966 (55 y.o. [name] [name] Primary [name] Physician: [name] [name] Other Clinician: Referring Physician: Treating  Physician/Extender: [name] [name]: 0 Education Assessment Education Provided To: Patient Education Topics Provided Wound/Skin Impairment: Methods: Explain/Verbal Responses: State content correctly Electronic Signature(s) Signed: 06/02/2020 5:50:50 PM By: [name] Hurst RN, BSN Entered By: [name] [name] on 06/02/2020  12:41:51 -------------------------------------------------------------------------------- Wound Assessment Details Patient Name: Date of Service: [name] [name], [name] [name] 06/02/2020 10:30 A M Medical Record Number: [id number] Patient Account Number: [id number] Date of Birth/Sex: Treating RN: November 26, 1965 (55 y.o. 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, Plantar Amputation Site - Transmetatarsal Wound Open Wounding Event: Gradually Appeared Status: Date Acquired: 03/05/2020 Comorbid Asthma, Sleep Apnea, Hypertension, Osteoarthritis, Weeks Of [name]: 0 History: Neuropathy, Seizure Disorder Clustered Wound: No Wound Measurements Length: (cm) 1 Width: (cm) 1 Depth: (cm) 0.3 Area: (cm) 0.785 Volume: (cm) 0.236 Wound Description Classification: Full Thickness Without Exposed Support Struct Wound Margin: Distinct, outline attached Exudate Amount: Medium Exudate Type: Serosanguineous Exudate Color: red, brown Foul Odor After Cleansing: Slough/Fibrino % Reduction in Area: % Reduction in Volume: Epithelialization: Small (1-33%) Tunneling: No Undermining: No ures No No Wound Bed Granulation Amount: Large (67-100%) Exposed Structure Granulation Quality: Red, Pink Fascia Exposed: No Necrotic Amount: None Present (0%) Fat Layer (Subcutaneous Tissue) Exposed: No Tendon Exposed:  No Muscle Exposed: No Joint Exposed: No Bone Exposed: No [name] Notes Wound #1 (Amputation Site - Transmetatarsal) Wound Laterality: Plantar, Right Cleanser [place] Ancillary Kit - 15 Day Supply Discharge Instruction: Use supplies as instructed; Kit contains: (15) Saline Bullets; (15) 3x3 Gauze; 15 pr Gloves Peri-Wound [name] Topical Primary Dressing Promogran Prisma Matrix, 4.34 (sq in) (silver collagen) Discharge Instruction: Moisten collagen with saline or hydrogel ([place] jelly) Secondary Dressing Woven Gauze Sponges 2x2 in Discharge Instruction: Apply over primary dressing as directed. Optifoam Non-Adhesive Dressing, 4x4 in Discharge Instruction: foam donut for offloading Secured With Kerlix Roll Sterile, 4.5x3.1 (in/yd) Discharge Instruction: Secure with Kerlix as directed. Paper Tape, 2x10 (in/yd) Discharge Instruction: Secure dressing with tape as directed. Compression Wrap Compression Stockings Add-Ons Notes Educated patient on dressings changes, how to [name] for wound, wear the walking boot, and when to return to wound center. patient in agreement. Electronic Signature(s) Signed: 06/02/2020 5:09:26 PM By: Rhae Hammock RN Entered By: Rhae Hammock on 06/02/2020 10:51:06 -------------------------------------------------------------------------------- Vitals Details Patient Name: Date of Service: [name] Shutter D. 06/02/2020 10:30 A M Medical Record Number: [id number] Patient Account Number: [id number] Date of Birth/Sex: Treating RN: 1966/04/16 (55 y.o. [name] [name], [name] [name] Primary [name] Daisie Haft: [name] [name] Other Clinician: Referring Syna Gad: Treating Crosby Bevan/Extender: [name] [name]: 0 Vital Signs Time Taken: 10:19 Temperature (F): 97.9 Height (in): 64 Pulse (bpm): 92 Source: Stated Respiratory Rate (breaths/min): 17 Weight (lbs): 228 Blood Pressure (mmHg): 137/90 Source: Stated Reference Range: 80 - 120 mg  / dl Body Mass Index (BMI): 39.1 Electronic Signature(s) Signed: 06/02/2020 5:09:26 PM By: Rhae Hammock RN Entered By: Rhae Hammock on 06/02/2020 10:19:21

## 2020-06-02 NOTE — Progress Notes (Signed)
KHANIYA, TENAGLIA ([id number]) Visit Report for 06/02/2020 Abuse/Suicide Risk Screen Details Patient Name: Date of Service: KEMBERLY, TAVES 06/02/2020 10:30 A M Medical Record Number: [id number] Patient Account Number: [id number] Date of Birth/Sex: Treating RN: [birth year] (55 y.o. Tonita Phoenix, Lauren Primary Care Dulcy Sida: Jenna Luo Other Clinician: Referring Mohogany Toppins: Treating Mykiah Schmuck/Extender: Rosezena Sensor in Treatment: 0 Abuse/Suicide Risk Screen Items Answer ABUSE RISK SCREEN: Has anyone close to you tried to hurt or harm you recentlyo No Do you feel uncomfortable with anyone in your familyo No Has anyone forced you do things that you didnt want to doo No Electronic Signature(s) Signed: 06/02/2020 5:09:26 PM By: Rhae Hammock RN Entered By: Rhae Hammock on 06/02/2020 10:29:42 -------------------------------------------------------------------------------- Activities of Daily Living Details Patient Name: Date of Service: DELAYNEE, ALRED 06/02/2020 10:30 A M Medical Record Number: [id number] Patient Account Number: [id number] Date of Birth/Sex: Treating RN: Dec 28, 1965 (55 y.o. Tonita Phoenix, Lauren Primary Care Ismael Treptow: Jenna Luo Other Clinician: Referring Nailah Luepke: Treating Alejandro Adcox/Extender: Rosezena Sensor in Treatment: 0 Activities of Daily Living Items Answer Activities of Daily Living (Please select one for each item) Drive Automobile Completely Able T Medications ake Completely Able Use T elephone Completely Able Care for Appearance Completely Able Use T oilet Completely Able Bath / Shower Completely Able Dress Self Completely Able Feed Self Completely Able Walk Completely Able Get In / Out Bed Completely Able Housework Completely Able Prepare Meals Completely Leonardo Completely Able Shop for Self Completely Able Electronic Signature(s) Signed: 06/02/2020 5:09:26 PM By:  Rhae Hammock RN Entered By: Rhae Hammock on 06/02/2020 10:29:59 -------------------------------------------------------------------------------- Education Screening Details Patient Name: Date of Service: [name] 06/02/2020 10:30 A M Medical Record Number: [id number] Patient Account Number: [id number] Date of Birth/Sex: Treating RN: 1966/03/13 (55 y.o. Tonita Phoenix, Lauren Primary Care Marvina Danner: Jenna Luo Other Clinician: Referring Robel Wuertz: Treating Shalay Carder/Extender: Rosezena Sensor in Treatment: 0 Primary Learner Assessed: Patient Learning Preferences/Education Level/Primary Language Learning Preference: Explanation, Demonstration, Communication Board, Printed Material Highest Education Level: Grade School Preferred Language: English Cognitive Barrier Language Barrier: No Translator Needed: No Memory Deficit: No Emotional Barrier: No Cultural/Religious Beliefs Affecting Medical Care: No Physical Barrier Impaired Vision: No Impaired Hearing: No Decreased Hand dexterity: No Knowledge/Comprehension Knowledge Level: High Comprehension Level: High Ability to understand written instructions: High Ability to understand verbal instructions: High Motivation Anxiety Level: Calm Cooperation: Cooperative Education Importance: Denies Need Interest in Health Problems: Asks Questions Perception: Coherent Willingness to Engage in Self-Management High Activities: Readiness to Engage in Self-Management High Activities: Electronic Signature(s) Signed: 06/02/2020 5:09:26 PM By: Rhae Hammock RN Entered By: Rhae Hammock on 06/02/2020 10:30:23 -------------------------------------------------------------------------------- Fall Risk Assessment Details Patient Name: Date of Service: [name] 06/02/2020 10:30 A M Medical Record Number: [id number] Patient Account Number: [id number] Date of Birth/Sex: Treating  RN: 10-Jan-1966 (55 y.o. Tonita Phoenix, Lauren Primary Care Kaelyn Nauta: Jenna Luo Other Clinician: Referring Jahlia Omura: Treating Decklan Mau/Extender: Rosezena Sensor in Treatment: 0 Fall Risk Assessment Items Have you had 2 or more falls in the last 12 monthso 0 Yes Have you had any fall that resulted in injury in the last 12 monthso 0 Yes FALLS RISK SCREEN History of falling - immediate or within 3 months 25 Yes Secondary diagnosis (Do you have 2 or more medical diagnoseso) 0 No Ambulatory aid None/bed rest/wheelchair/nurse 0 No Crutches/cane/walker 15 Yes Furniture 0 No Intravenous therapy Access/Saline/Heparin Lock 0 No Gait/Transferring Normal/ bed rest/ wheelchair 0 No Weak (short steps with or  without shuffle, stooped but able to lift head while walking, may seek 0 No support from furniture) Impaired (short steps with shuffle, may have difficulty arising from chair, head down, impaired 0 No balance) Mental Status Oriented to own ability 0 Yes Electronic Signature(s) Signed: 06/02/2020 5:09:26 PM By: Rhae Hammock RN Entered By: Rhae Hammock on 06/02/2020 10:32:46 -------------------------------------------------------------------------------- Foot Assessment Details Patient Name: Date of Service: [name] 06/02/2020 10:30 A M Medical Record Number: [id number] Patient Account Number: [id number] Date of Birth/Sex: Treating RN: [birth year] (55 y.o. Tonita Phoenix, Lauren Primary Care Heaton Sarin: Jenna Luo Other Clinician: Referring Wasyl Dornfeld: Treating Jonothan Heberle/Extender: Rosezena Sensor in Treatment: 0 Foot Assessment Items Site Locations + = Sensation present, - = Sensation absent, C = Callus, U = Ulcer R = Redness, W = Warmth, M = Maceration, PU = Pre-ulcerative lesion F = Fissure, S = Swelling, D = Dryness Assessment Right: Left: Other Deformity: No No Prior Foot Ulcer: No No Prior Amputation: Yes  No Charcot Joint: No No Ambulatory Status: Ambulatory With Help Assistance Device: Cane Gait: Steady Notes right transmet Electronic Signature(s) Signed: 06/02/2020 5:09:26 PM By: Rhae Hammock RN Entered By: Rhae Hammock on 06/02/2020 10:45:38 -------------------------------------------------------------------------------- Nutrition Risk Screening Details Patient Name: Date of Service: [name] 06/02/2020 10:30 A M Medical Record Number: [id number] Patient Account Number: [id number] Date of Birth/Sex: Treating RN: 1965/09/23 (55 y.o. Tonita Phoenix, Lauren Primary Care Navarro Nine: Jenna Luo Other Clinician: Referring Rolando Whitby: Treating Tylek Boney/Extender: Rosezena Sensor in Treatment: 0 Height (in): 64 Weight (lbs): 228 Body Mass Index (BMI): 39.1 Nutrition Risk Screening Items Score Screening NUTRITION RISK SCREEN: I have an illness or condition that made me change the kind and/or amount of food I eat 0 No I eat fewer than two meals per day 0 No I eat few fruits and vegetables, or milk products 0 No I have three or more drinks of beer, liquor or wine almost every day 0 No I have tooth or mouth problems that make it hard for me to eat 0 No I don't always have enough money to buy the food I need 0 No I eat alone most of the time 0 No I take three or more different prescribed or over-the-counter drugs a day 0 No Without wanting to, I have lost or gained 10 pounds in the last six months 0 No I am not always physically able to shop, cook and/or feed myself 0 No Nutrition Protocols Good Risk Protocol 0 No interventions needed Moderate Risk Protocol High Risk Proctocol Risk Level: Good Risk Score: 0 Electronic Signature(s) Signed: 06/02/2020 5:09:26 PM By: Rhae Hammock RN Entered By: Rhae Hammock on 06/02/2020 10:32:51

## 2020-06-07 ENCOUNTER — Other Ambulatory Visit: Payer: Self-pay | Admitting: Family Medicine

## 2020-06-09 ENCOUNTER — Encounter (HOSPITAL_BASED_OUTPATIENT_CLINIC_OR_DEPARTMENT_OTHER): Payer: 59 | Admitting: Internal Medicine

## 2020-06-09 ENCOUNTER — Other Ambulatory Visit: Payer: Self-pay

## 2020-06-09 NOTE — Progress Notes (Signed)
[name] [name] ([id number]) Visit Report for 06/09/2020 HPI Details Patient Name: Date of Service: [name] [name], [name] [name] 06/09/2020 2:00 PM Medical Record Number: [id number] Patient Account Number: [id number] Date of Birth/Sex: Treating RN: [birth year] (55 y.o. [name] Primary Care Provider: Jenna Luo Other Clinician: Referring Provider: Treating Provider/Extender: Rosezena Sensor in Treatment: 1 History of Present Illness HPI Description: ADMISSION 06/02/2020 This is a 55 year old woman who has had problems with wounds on her predominantly plantar right foot for quite a period of time. 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 More recently she has had an area developed in the right mid foot at roughly the third metatarsal head. She says this started as a blister of November. Its not been closing. More recently she has been using Silvadene cream she has a cam boot to offload. I note she walks with a cane The patient has had arterial studies done in 2019 at which time her ABI in the right was 1.28 with triphasic waveforms noncompressible on the left with triphasic waveform. Her ABI in our clinic today was 1.3 on the right. 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 She had a foot gastrocnemius recession sometime in the fall 2021 2/21; patient arrives today with a wound measuring slightly smaller 3 x 3 mm however there is circumferential area of erythema around this. She does not have any complaints Electronic Signature(s) Signed: 06/09/2020 5:55:48 PM By: Linton Ham MD Entered By: Linton Ham on 06/09/2020 15:17:07 -------------------------------------------------------------------------------- Physical Exam  Details Patient Name: Date of Service: [name] Shutter D. 06/09/2020 2:00 PM Medical Record Number: [id number] Patient Account Number: [id number] Date of Birth/Sex: Treating RN: 05-Dec-1965 (55 y.o. [name] Primary Care Provider: Jenna Luo Other Clinician: Referring Provider: Treating Provider/Extender: Rosezena Sensor in Treatment: 1 Constitutional Sitting or standing Blood Pressure is within target range for patient.. Pulse regular and within target range for patient.Marland Kitchen Respirations regular, non-labored and within target range.. Temperature is normal and within the target range for the patient.Marland Kitchen Appears in no distress. Cardiovascular Pedal pulses are palpable. Notes Wound exam; circular wound over the third met head. The wound itself looks satisfactory no debridement however there is erythema circumferentially around this area perhaps 2-3 diameters. Even though she has neuropathy I think there is some tenderness here. I am concerned about cellulitis Electronic Signature(s) Signed: 06/09/2020 5:55:48 PM By: Linton Ham MD Entered By: Linton Ham on 06/09/2020 15:18:10 -------------------------------------------------------------------------------- Physician Orders Details Patient Name: Date of Service: [name] Shutter D. 06/09/2020 2:00 PM Medical Record Number: [id number] Patient Account Number: [id number] Date of Birth/Sex: Treating RN: 10-Sep-1965 (55 y.o. [name] Primary Care Provider: Jenna Luo Other Clinician: Referring Provider: Treating Provider/Extender: Rosezena Sensor in Treatment: 1 Verbal / Phone Orders: No Diagnosis Coding ICD-10 Coding Code Description [id number] Non-pressure chronic ulcer of other part of right foot with fat layer exposed G90.09 Other idiopathic peripheral autonomic neuropathy Follow-up Appointments Return Appointment in 1 week. Bathing/ Shower/ Hygiene May shower  with protection but do not get wound dressing(s) wet. May shower and wash wound with soap and water. - on days that dressing is changed Off-Loading Removable cast walker boot to: - right foot Wound Treatment Wound #1 - Amputation Site - Transmetatarsal  Wound Laterality: Plantar, Right Cleanser: Soap and Water Every Other Day/15 Days Discharge Instructions: May shower and wash wound with dial antibacterial soap and water prior to dressing change. Prim Dressing: Promogran Prisma Matrix, 4.34 (sq in) (silver collagen) (Generic) Every Other Day/15 Days ary Discharge Instructions: Moisten collagen with saline or hydrogel (KY jelly) Secondary Dressing: Woven Gauze Sponges 2x2 in (Generic) Every Other Day/15 Days Discharge Instructions: Apply over primary dressing as directed. Secondary Dressing: Optifoam Non-Adhesive Dressing, 4x4 in (Generic) Every Other Day/15 Days Discharge Instructions: foam donut for offloading Secured With: Kerlix Roll Sterile, 4.5x3.1 (in/yd) (Generic) Every Other Day/15 Days Discharge Instructions: Secure with Kerlix as directed. Secured With: Paper Tape, 2x10 (in/yd) (Generic) Every Other Day/15 Days Discharge Instructions: Secure dressing with tape as directed. Patient Medications llergies: codeine, tetracycline, penicillin, tramadol, estradiol, ciprofloxacin, phenazopyridine HCl A Notifications Medication Indication Start End 06/09/2020 sulfamethoxazole-trimethoprim DOSE oral 800 mg-160 mg tablet - 1 tablet oral bid for 7 days Electronic Signature(s) Signed: 06/09/2020 3:22:20 PM By: Linton Ham MD Entered By: Linton Ham on 06/09/2020 15:22:20 -------------------------------------------------------------------------------- Problem List Details Patient Name: Date of Service: [name] Shutter D. 06/09/2020 2:00 PM Medical Record Number: [id number] Patient Account Number: [id number] Date of Birth/Sex: Treating RN: Jul 17, 1965 (55 y.o. [name] Primary  Care Provider: Jenna Luo Other Clinician: Referring Provider: Treating Provider/Extender: Rosezena Sensor in Treatment: 1 Active Problems ICD-10 Encounter Code Description Active Date MDM Diagnosis [phone] Non-pressure chronic ulcer of other part of right foot with fat layer exposed 06/02/2020 No Yes G90.09 Other idiopathic peripheral autonomic neuropathy 06/02/2020 No Yes L03.115 Cellulitis of right lower limb 06/09/2020 No Yes Inactive Problems Resolved Problems Electronic Signature(s) Signed: 06/09/2020 5:55:48 PM By: Linton Ham MD Entered By: Linton Ham on 06/09/2020 15:16:07 -------------------------------------------------------------------------------- Progress Note Details Patient Name: Date of Service: [name] Shutter D. 06/09/2020 2:00 PM Medical Record Number: [id number] Patient Account Number: [id number] Date of Birth/Sex: Treating RN: 1965-10-18 (55 y.o. [name] Primary Care Provider: Jenna Luo Other Clinician: Referring Provider: Treating Provider/Extender: Rosezena Sensor in Treatment: 1 Subjective History of Present Illness (HPI) ADMISSION 06/02/2020 This is a 55 year old woman who has had problems with wounds on her predominantly plantar right foot for quite a period of time. 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 More recently she has had an area developed in the right mid foot at roughly the third metatarsal head. She says this started as a blister of November. Its not been closing. More recently she has been using Silvadene cream she has a cam boot to offload. I note she walks with a cane The patient has had arterial studies done in 2019 at which time her ABI in the right was 1.28 with triphasic waveforms noncompressible on the left with triphasic waveform. Her ABI in our clinic  today was 1.3 on the right. 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 She had a foot gastrocnemius recession sometime in the fall 2021 2/21; patient arrives today with a wound measuring slightly smaller 3 x 3 mm however there is circumferential area of erythema around this. She does not have any complaints Objective Constitutional Sitting or standing Blood Pressure is within target range for patient.. Pulse regular and within target range for patient.Marland Kitchen Respirations regular, non-labored  and within target range.. Temperature is normal and within the target range for the patient.Marland Kitchen Appears in no distress. Vitals Time Taken: 2:17 PM, Height: 64 in, Weight: 228 lbs, BMI: 39.1, Temperature: 98.2 F, Pulse: 92 bpm, Respiratory Rate: 17 breaths/min, Blood Pressure: 132/85 mmHg. Cardiovascular Pedal pulses are palpable. General Notes: Wound exam; circular wound over the third met head. The wound itself looks satisfactory no debridement however there is erythema circumferentially around this area perhaps 2-3 diameters. Even though she has neuropathy I think there is some tenderness here. I am concerned about cellulitis Integumentary (Hair, Skin) Wound #1 status is Open. Original cause of wound was Gradually Appeared. The date acquired was: 03/05/2020. The wound has been in treatment 1 weeks. The wound is located on the Right,Plantar Amputation Site - Transmetatarsal. The wound measures 0.7cm length x 0.7cm width x 0.3cm depth; 0.385cm^2 area and 0.115cm^3 volume. There is Fat Layer (Subcutaneous Tissue) exposed. There is no tunneling or undermining noted. There is a medium amount of serosanguineous drainage noted. The wound margin is distinct with the outline attached to the wound base. There is large (67-100%) red, pink granulation within the wound bed. There is no necrotic tissue within  the wound bed. Assessment Active Problems ICD-10 Non-pressure chronic ulcer of other part of right foot with fat layer exposed Other idiopathic peripheral autonomic neuropathy Cellulitis of right lower limb Plan Follow-up Appointments: Return Appointment in 1 week. Bathing/ Shower/ Hygiene: May shower with protection but do not get wound dressing(s) wet. May shower and wash wound with soap and water. - on days that dressing is changed Off-Loading: Removable cast walker boot to: - right foot The following medication(s) was prescribed: sulfamethoxazole-trimethoprim oral 800 mg-160 mg tablet 1 tablet oral bid for 7 days starting 06/09/2020 WOUND #1: - Amputation Site - Transmetatarsal Wound Laterality: Plantar, Right Cleanser: Soap and Water Every Other Day/15 Days Discharge Instructions: May shower and wash wound with dial antibacterial soap and water prior to dressing change. Prim Dressing: Promogran Prisma Matrix, 4.34 (sq in) (silver collagen) (Generic) Every Other Day/15 Days ary Discharge Instructions: Moisten collagen with saline or hydrogel (KY jelly) Secondary Dressing: Woven Gauze Sponges 2x2 in (Generic) Every Other Day/15 Days Discharge Instructions: Apply over primary dressing as directed. Secondary Dressing: Optifoam Non-Adhesive Dressing, 4x4 in (Generic) Every Other Day/15 Days Discharge Instructions: foam donut for offloading Secured With: Kerlix Roll Sterile, 4.5x3.1 (in/yd) (Generic) Every Other Day/15 Days Discharge Instructions: Secure with Kerlix as directed. Secured With: Paper T ape, 2x10 (in/yd) (Generic) Every Other Day/15 Days Discharge Instructions: Secure dressing with tape as directed. 1. I continued with silver collagen 2. Empiric antibiotic treatment. I did not see anything worth culturing here however the degree of erythema and some tenderness had me concerned. She has a complicated set of allergies including penicillin, doxycycline and quinolones I  therefore put her on Septra DS 1 p.o. twice daily 3. She is on potassium but not on diuretics. In epic the last potassium we saw was back in October at 4.6 I am going to put her potassium on hold while she is taking this antibiotic Electronic Signature(s) Signed: 06/09/2020 5:55:48 PM By: Baltazar Najjar MD Entered By: Baltazar Najjar on 06/09/2020 15:23:29 -------------------------------------------------------------------------------- SuperBill Details Patient Name: Date of Service: [name] [name] 06/09/2020 Medical Record Number: [id number] Patient Account Number: [id number] Date of Birth/Sex: Treating RN: 01/30/1966 (55 y.o. Wynelle Link Primary Care Provider: Lynnea Ferrier Other Clinician: Referring Provider: Treating Provider/Extender: Princella Pellegrini in Treatment: 1 Diagnosis  Coding ICD-10 Codes Code Description L45.625 Non-pressure chronic ulcer of other part of right foot with fat layer exposed G90.09 Other idiopathic peripheral autonomic neuropathy L03.115 Cellulitis of right lower limb Physician Procedures : CPT4 Code Description Modifier [id number] - WC PHYS LEVEL 4 - EST PT ICD-10 Diagnosis Description L97.512 Non-pressure chronic ulcer of other part of right foot with fat layer exposed L03.115 Cellulitis of right lower limb G90.09 Other idiopathic  peripheral autonomic neuropathy Quantity: 1 Electronic Signature(s) Signed: 06/09/2020 5:55:48 PM By: Linton Ham MD Entered By: Linton Ham on 06/09/2020 15:23:49

## 2020-06-10 NOTE — Progress Notes (Signed)
[name] [name] ([id number]) Visit Report for 06/09/2020 Arrival Information Details Patient Name: Date of Service: [name], [name] 06/09/2020 2:00 PM Medical Record Number: [id number] Patient Account Number: [id number] Date of Birth/Sex: Treating RN: [birth year] (55 y.o. [name] [name] Primary Care Provider: Jenna [name] Other Clinician: Referring Provider: Treating Provider/Extender: [name] [name] in Treatment: 1 Visit Information History Since Last Visit Added or deleted any medications: No Patient Arrived: [name] [name] Any new allergies or adverse reactions: No Arrival Time: 14:15 Had a fall or experienced change in Yes Accompanied By: self activities of daily living that may affect Transfer Assistance: None risk of falls: Patient Identification Verified: Yes Signs or symptoms of abuse/neglect since last visito No Secondary Verification Process Completed: Yes Hospitalized since last visit: No Patient Requires Transmission-Based Precautions: No Implantable device outside of the clinic excluding No Patient Has Alerts: No cellular tissue based products placed in the center since last visit: Has Dressing in Place as Prescribed: Yes Pain Present Now: Yes Notes Patient stated she had a fall over her nieces toys in the floor but did not hurt herself. Electronic Signature(s) Signed: 06/10/2020 9:47:25 AM By: [name] [name] Entered By: [name] [name] on 06/09/2020 14:18:22 -------------------------------------------------------------------------------- Clinic Level of Care Assessment Details Patient Name: Date of Service: [name], [name] 06/09/2020 2:00 PM Medical Record Number: [id number] Patient Account Number: [id number] Date of Birth/Sex: Treating RN: 1966-04-07 (55 y.o. [name] [name] Primary Care Provider: Jenna [name] Other Clinician: Referring Provider: Treating Provider/Extender: [name] [name] in Treatment:  1 Clinic Level of Care Assessment Items TOOL 4 Quantity Score X- 1 0 Use when only an EandM is performed on FOLLOW-UP visit ASSESSMENTS - Nursing Assessment / Reassessment X- 1 10 Reassessment of Co-morbidities (includes updates in patient status) X- 1 5 Reassessment of Adherence to Treatment Plan ASSESSMENTS - Wound and Skin A ssessment / Reassessment X - Simple Wound Assessment / Reassessment - one wound 1 5 []  - 0 Complex Wound Assessment / Reassessment - multiple wounds []  - 0 Dermatologic / Skin Assessment (not related to wound area) ASSESSMENTS - Focused Assessment []  - 0 Circumferential Edema Measurements - multi extremities []  - 0 Nutritional Assessment / Counseling / Intervention X- 1 5 Lower Extremity Assessment (monofilament, tuning fork, pulses) []  - 0 Peripheral Arterial Disease Assessment (using hand held doppler) ASSESSMENTS - Ostomy and/or Continence Assessment and Care []  - 0 Incontinence Assessment and Management []  - 0 Ostomy Care Assessment and Management (repouching, etc.) PROCESS - Coordination of Care X - Simple Patient / Family Education for ongoing care 1 15 []  - 0 Complex (extensive) Patient / Family Education for ongoing care X- 1 10 Staff obtains Programmer, systems, Records, T Results / Process Orders est []  - 0 Staff telephones HHA, Nursing Homes / Clarify orders / etc []  - 0 Routine Transfer to another Facility (non-emergent condition) []  - 0 Routine Hospital Admission (non-emergent condition) []  - 0 New Admissions / Biomedical engineer / Ordering NPWT Apligraf, etc. , []  - 0 Emergency Hospital Admission (emergent condition) X- 1 10 Simple Discharge Coordination []  - 0 Complex (extensive) Discharge Coordination PROCESS - Special Needs []  - 0 Pediatric / Minor Patient Management []  - 0 Isolation Patient Management []  - 0 Hearing / Language / Visual special needs []  - 0 Assessment of Community assistance (transportation, D/C planning,  etc.) []  - 0 Additional assistance / Altered mentation []  - 0 Support Surface(s) Assessment (bed, cushion, seat, etc.) INTERVENTIONS - Wound Cleansing / Measurement X - Simple Wound  Cleansing - one wound 1 5 []  - 0 Complex Wound Cleansing - multiple wounds X- 1 5 Wound Imaging (photographs - any number of wounds) []  - 0 Wound Tracing (instead of photographs) X- 1 5 Simple Wound Measurement - one wound []  - 0 Complex Wound Measurement - multiple wounds INTERVENTIONS - Wound Dressings []  - 0 Small Wound Dressing one or multiple wounds X- 1 15 Medium Wound Dressing one or multiple wounds []  - 0 Large Wound Dressing one or multiple wounds X- 1 5 Application of Medications - topical []  - 0 Application of Medications - injection INTERVENTIONS - Miscellaneous []  - 0 External ear exam []  - 0 Specimen Collection (cultures, biopsies, blood, body fluids, etc.) []  - 0 Specimen(s) / Culture(s) sent or taken to Lab for analysis []  - 0 Patient Transfer (multiple staff / Civil Service fast streamer / Similar devices) []  - 0 Simple Staple / Suture removal (25 or less) []  - 0 Complex Staple / Suture removal (26 or more) []  - 0 Hypo / Hyperglycemic Management (close monitor of Blood Glucose) []  - 0 Ankle / Brachial Index (ABI) - do not check if billed separately X- 1 5 Vital Signs Has the patient been seen at the hospital within the last three years: Yes Total Score: 100 Level Of Care: New/Established - Level 3 Electronic Signature(s) Signed: 06/09/2020 5:41:15 PM By: [name] Hurst RN, BSN Entered By: [name] [name] on 06/09/2020 17:22:35 -------------------------------------------------------------------------------- Encounter Discharge Information Details Patient Name: Date of Service: [name] Shutter D. 06/09/2020 2:00 PM Medical Record Number: [id number] Patient Account Number: [id number] Date of Birth/Sex: Treating RN: [birth year] (55 y.o. [name] [name] Primary Care Provider: Jenna [name] Other Clinician: Referring Provider: Treating Provider/Extender: [name] [name] in Treatment: 1 Encounter Discharge Information Items Discharge Condition: Stable Ambulatory Status: Cane Discharge Destination: Home Transportation: Private Auto Accompanied By: self Schedule Follow-up Appointment: Yes Clinical Summary of Care: Electronic Signature(s) Signed: 06/09/2020 6:14:41 PM By: Deon Pilling Entered By: Deon Pilling on 06/09/2020 17:48:57 -------------------------------------------------------------------------------- Lower Extremity Assessment Details Patient Name: Date of Service: [name], [name] 06/09/2020 2:00 PM Medical Record Number: [id number] Patient Account Number: [id number] Date of Birth/Sex: Treating RN: 03-Oct-1965 (55 y.o. [name] [name] Primary Care Provider: Jenna [name] Other Clinician: Referring Provider: Treating Provider/Extender: [name] [name] in Treatment: 1 Edema Assessment Assessed: Shirlyn Goltz: No] Patrice Paradise: Yes] Edema: [Left: N] [Right: o] Calf Left: Right: Point of Measurement: 34 cm From Medial Instep 38 cm Ankle Left: Right: Point of Measurement: 9 cm From Medial Instep 24 cm Vascular Assessment Pulses: Dorsalis Pedis Palpable: [Right:Yes] Electronic Signature(s) Signed: 06/09/2020 6:14:41 PM By: Deon Pilling Entered By: Deon Pilling on 06/09/2020 14:35:08 -------------------------------------------------------------------------------- Multi Wound Chart Details Patient Name: Date of Service: [name] Shutter D. 06/09/2020 2:00 PM Medical Record Number: [id number] Patient Account Number: [id number] Date of Birth/Sex: Treating RN: Oct 31, 1965 (55 y.o. 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] [N/A:N/A] Primary Etiology: [1:Asthma, Sleep Apnea, Hypertension, N/A] Comorbid History: [1:Osteoarthritis, Neuropathy, Seizure Disorder 03/05/2020] [N/A:N/A] Date Acquired: [1:1] [N/A:N/A] Weeks of Treatment: [1:Open] [N/A:N/A] Wound Status: [1:0.7x0.7x0.3] [N/A:N/A] Measurements L x W x D (cm) [1:0.385] [N/A:N/A] A (cm) : rea [1:0.115] [N/A:N/A] Volume (cm) : [  1:51.00%] [N/A:N/A] % Reduction in Area: [1:51.30%] [N/A:N/A] % Reduction in Volume: [1:Full Thickness Without Exposed] [N/A:N/A] Classification: [1:Support Structures Medium] [N/A:N/A] Exudate Amount: [1:Serosanguineous] [N/A:N/A] Exudate Type: [1:red, brown] [N/A:N/A] Exudate Color: [1:Distinct, outline attached] [N/A:N/A] Wound Margin: [1:Large (67-100%)] [N/A:N/A] Granulation Amount: [1:Red, Pink] [N/A:N/A] Granulation Quality: [1:None Present (0%)] [N/A:N/A] Necrotic Amount: [1:Fat Layer (Subcutaneous Tissue): Yes N/A] Exposed Structures: [1:Fascia: No Tendon: No Muscle: No Joint: No Bone: No Small (1-33%)] [N/A:N/A] Treatment Notes Electronic Signature(s) Signed: 06/09/2020 5:41:15 PM By: [name] Hurst RN, BSN Signed: 06/09/2020 5:55:48 PM By: Linton Ham MD Entered By: Linton Ham on 06/09/2020 15:16:16 -------------------------------------------------------------------------------- Multi-Disciplinary Care Plan Details Patient Name: Date of Service: [name] Shutter D. 06/09/2020 2:00 PM Medical Record Number: [id number] Patient Account Number: [id number] Date of Birth/Sex: Treating RN: [birth year] (55 y.o. [name] [name] Primary Care Provider: Jenna [name] Other Clinician: Referring  Provider: Treating Provider/Extender: [name] [name] in Treatment: 1 Multidisciplinary Care Plan reviewed with physician Active Inactive Abuse / Safety / Falls / Self Care Management Nursing Diagnoses: History of Falls Potential for injury related to falls Goals: Patient will not experience any injury related to falls Date Initiated: 06/02/2020 Target Resolution Date: 07/04/2020 Goal Status: Active Patient/caregiver will verbalize/demonstrate measures taken to prevent injury and/or falls Date Initiated: 06/02/2020 Target Resolution Date: 07/04/2020 Goal Status: Active Interventions: Assess Activities of Daily Living upon admission and as needed Assess fall risk on admission and as needed Assess: immobility, friction, shearing, incontinence upon admission and as needed Assess impairment of mobility on admission and as needed per policy Assess personal safety and home safety (as indicated) on admission and as needed Assess self care needs on admission and as needed Provide education on personal and home safety Notes: Wound/Skin Impairment Nursing Diagnoses: Impaired tissue integrity Knowledge deficit related to ulceration/compromised skin integrity Goals: Patient/caregiver will verbalize understanding of skin care regimen Date Initiated: 06/02/2020 Target Resolution Date: 07/04/2020 Goal Status: Active Ulcer/skin breakdown will have a volume reduction of 30% by week 4 Date Initiated: 06/02/2020 Target Resolution Date: 07/04/2020 Goal Status: Active Interventions: Assess patient/caregiver ability to obtain necessary supplies Assess patient/caregiver ability to perform ulcer/skin care regimen upon admission and as needed Assess ulceration(s) every visit Provide education on ulcer and skin care Notes: Electronic Signature(s) Signed: 06/09/2020 5:41:15 PM By: [name] Hurst RN, BSN Entered By: [name] [name] on 06/09/2020  15:20:56 -------------------------------------------------------------------------------- Pain Assessment Details Patient Name: Date of Service: [name] Shutter D. 06/09/2020 2:00 PM Medical Record Number: [id number] Patient Account Number: [id number] Date of Birth/Sex: Treating RN: 1965-11-01 (55 y.o. [name] [name] Primary Care Provider: Jenna [name] Other Clinician: Referring Provider: Treating Provider/Extender: [name] [name] in Treatment: 1 Active Problems Location of Pain Severity and Description of Pain Patient Has Paino Yes Site Locations Rate the pain. Current Pain Level: 5 Pain Management and Medication Current Pain Management: Electronic Signature(s) Signed: 06/09/2020 5:41:15 PM By: [name] Hurst RN, BSN Signed: 06/10/2020 9:47:25 AM By: [name] [name] Entered By: [name] [name] on 06/09/2020 14:17:43 -------------------------------------------------------------------------------- Patient/Caregiver Education Details Patient Name: Date of Service: [name] [name] 2/21/2022andnbsp2:00 PM Medical Record Number: [id number] Patient Account Number: [id number] Date of Birth/Gender: Treating RN: [birth year] (55 y.o. [name] [name] Primary Care Physician: [name] [name] Other Clinician: Referring Physician: Treating Physician/Extender: [name] [name] in Treatment: 1 Education Assessment Education Provided To: Patient Education Topics Provided Wound/Skin Impairment: Methods: Explain/Verbal Responses: State content correctly Electronic Signature(s) Signed: 06/09/2020 5:41:15 PM By: [name] Hurst RN, BSN Entered By: [name] [name] on 06/09/2020 17:22:15 --------------------------------------------------------------------------------  Wound Assessment Details Patient Name: Date of Service: [name], [name] 06/09/2020 2:00 PM Medical Record Number: [id number] Patient Account Number: [id number] Date of  Birth/Sex: Treating RN: 07-01-1965 (55 y.o. 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, Plantar Amputation Site - Transmetatarsal Wound Open Wounding Event: Gradually Appeared Status: Date Acquired: 03/05/2020 Comorbid Asthma, Sleep Apnea, Hypertension, Osteoarthritis, Weeks Of Treatment: 1 History: Neuropathy, Seizure Disorder Clustered Wound: No Wound Measurements Length: (cm) 0.7 Width: (cm) 0.7 Depth: (cm) 0.3 Area: (cm) 0.385 Volume: (cm) 0.115 % Reduction in Area: 51% % Reduction in Volume: 51.3% Epithelialization: Small (1-33%) Tunneling: No Undermining: No Wound Description Classification: Full Thickness Without Exposed Support Structures Wound Margin: Distinct, outline attached Exudate Amount: Medium Exudate Type: Serosanguineous Exudate Color: red, brown Foul Odor After Cleansing: No Slough/Fibrino No Wound Bed Granulation Amount: Large (67-100%) Exposed Structure Granulation Quality: Red, Pink Fascia Exposed: No Necrotic Amount: None Present (0%) Fat Layer (Subcutaneous Tissue) Exposed: Yes Tendon Exposed: No Muscle Exposed: No Joint Exposed: No Bone Exposed: No Treatment Notes Wound #1 (Amputation Site - Transmetatarsal) Wound Laterality: Plantar, Right Cleanser Soap and Water Discharge Instruction: May shower and wash wound with dial antibacterial soap and water prior to dressing change. Peri-Wound Care Topical Primary Dressing Promogran Prisma Matrix, 4.34 (sq in) (silver collagen) Discharge Instruction: Moisten collagen with saline or hydrogel ([place] jelly) Secondary Dressing Woven Gauze Sponges 2x2 in Discharge Instruction: Apply over primary dressing as directed. Optifoam Non-Adhesive Dressing, 4x4  in Discharge Instruction: foam donut for offloading Secured With Kerlix Roll Sterile, 4.5x3.1 (in/yd) Discharge Instruction: Secure with Kerlix as directed. Paper Tape, 2x10 (in/yd) Discharge Instruction: Secure dressing with tape as directed. Compression Wrap Compression Stockings Add-Ons Electronic Signature(s) Signed: 06/09/2020 5:41:15 PM By: [name] Hurst RN, BSN Signed: 06/09/2020 6:14:41 PM By: Deon Pilling Entered By: Deon Pilling on 06/09/2020 14:35:34 -------------------------------------------------------------------------------- Vitals Details Patient Name: Date of Service: [name] Shutter D. 06/09/2020 2:00 PM Medical Record Number: [id number] Patient Account Number: [id number] Date of Birth/Sex: Treating RN: [birth year] (54 y.o. [name] [name] Primary Care Provider: Jenna [name] Other Clinician: Referring Provider: Treating Provider/Extender: [name] [name] in Treatment: 1 Vital Signs Time Taken: 14:17 Temperature (F): 98.2 Height (in): 64 Pulse (bpm): 92 Weight (lbs): 228 Respiratory Rate (breaths/min): 17 Body Mass Index (BMI): 39.1 Blood Pressure (mmHg): 132/85 Reference Range: 80 - 120 mg / dl Electronic Signature(s) Signed: 06/10/2020 9:47:25 AM By: [name] [name] Entered By: [name] [name] on 06/09/2020 14:17:34

## 2020-06-16 ENCOUNTER — Encounter (HOSPITAL_BASED_OUTPATIENT_CLINIC_OR_DEPARTMENT_OTHER): Payer: 59 | Admitting: Internal Medicine

## 2020-06-16 ENCOUNTER — Other Ambulatory Visit: Payer: Self-pay

## 2020-06-16 DIAGNOSIS — L97512 Non-pressure chronic ulcer of other part of right foot with fat layer exposed: Secondary | ICD-10-CM | POA: Diagnosis not present

## 2020-06-16 NOTE — Progress Notes (Signed)
[name] [name], [name] [name] ([id number]) Visit Report for 06/16/2020 Debridement Details Patient Name: Date of Service: [name] [name] 06/16/2020 2:45 PM Medical Record Number: [id number] Patient Account Number: [id number] Date of Birth/Sex: Treating RN: 1965-08-15 (55 y.o. [name] [name] Primary Care Provider: Jenna [name] Other Clinician: Referring Provider: Treating Provider/Extender: [name] [name] in Treatment: 2 Debridement Performed for Assessment: Wound #1 Right,Plantar Amputation Site - Transmetatarsal Performed By: Physician [name] [name]., MD Debridement Type: Debridement Level of Consciousness (Pre-procedure): Awake and Alert Pre-procedure Verification/Time Out Yes - 15:55 Taken: Start Time: 15:55 T Area Debrided (L x W): otal 0.7 (cm) x 0.6 (cm) = 0.42 (cm) Tissue and other material debrided: Viable, Non-Viable, Slough, Subcutaneous, Slough Level: Skin/Subcutaneous Tissue Debridement Description: Excisional Instrument: Curette Bleeding: Moderate Hemostasis Achieved: Silver Nitrate End Time: 15:56 Procedural Pain: 0 Post Procedural Pain: 0 Response to Treatment: Procedure was tolerated well Level of Consciousness (Post- Awake and Alert procedure): Post Debridement Measurements of Total Wound Length: (cm) 0.7 Width: (cm) 0.6 Depth: (cm) 0.2 Volume: (cm) 0.066 Character of Wound/Ulcer Post Debridement: Improved Post Procedure Diagnosis Same as Pre-procedure Electronic Signature(s) Signed: 06/16/2020 5:56:44 PM By: [name] Hurst RN, BSN Signed: 06/16/2020 6:21:54 PM By: [name] Ham MD [name] [name] on 06/16/2020 17:32:41 -------------------------------------------------------------------------------- HPI Details Patient Name: Date of Service: [name] [name] 06/16/2020 2:45 PM Medical Record Number: [id number] Patient Account Number: [id number] Date of Birth/Sex: Treating RN: 09/05/1965 (55 y.o. [name] [name] Primary Care Provider: Jenna [name] Other Clinician: Referring Provider: Treating Provider/Extender: [name] [name] in Treatment: 2 History of Present Illness HPI Description: ADMISSION 06/02/2020 This is a 55 year old woman who has had problems with wounds on her predominantly plantar right foot for quite a period of time. 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 More recently she has had an area developed in the right mid foot at roughly the third metatarsal head. She says this started as a blister of November. Its not been closing. More recently she has been using Silvadene cream she has a cam boot to offload. I note she walks with a cane The patient has had arterial studies done in 2019 at which time her ABI in the right was 1.28 with triphasic waveforms noncompressible on the left with triphasic waveform. Her ABI in our clinic today was 1.3 on the right. 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 She had a foot gastrocnemius recession sometime in the fall 2021 2/21; patient arrives today with a wound measuring slightly smaller 3 x 3 mm however there is circumferential area of erythema around this. She does not have any complaints 2/28; plantar wound on the right first transmetatarsal amputation site. Small oval-shaped wound raised skin around the edges. We have been using Hydrofera Blue. She does not have arterial issues Electronic Signature(s) Signed: 06/16/2020 6:21:54 PM By: [name] Ham MD [name] [name] on 06/16/2020 17:33:37 -------------------------------------------------------------------------------- Physical Exam Details Patient Name: Date of Service: [name] [name] 06/16/2020 2:45 PM Medical  Record Number: [id number] Patient Account Number: [id number] Date of Birth/Sex: Treating RN: 08-Jul-1965 (55 y.o. [name] [name] Primary Care Provider: Jenna [name] Other Clinician: Referring Provider: Treating Provider/Extender: [name] [name] in Treatment: 2 Constitutional Sitting or standing Blood Pressure is within  target range for patient.. Pulse regular and within target range for patient.Marland Kitchen Respirations regular, non-labored and within target range.. Temperature is normal and within the target range for the patient.Marland Kitchen Appears in no distress. Notes Wound exam; circular wound over the third met head. As with not making any progress with this I did a reasonably aggressive debridement with a #3 curette removing the rim of skin and subcutaneous tissue from around the wound margins and debris on the surface. This cleans up quite nicely. Notable for the fact that the erythema around the wound from last week is still present I think this is more of a pressure issue in her cam boot than anything else. Electronic Signature(s) Signed: 06/16/2020 6:21:54 PM By: [name] Najjar MD [name] By: [name] [name] on 06/16/2020 17:34:36 -------------------------------------------------------------------------------- Physician Orders Details Patient Name: Date of Service: [name] [name] 06/16/2020 2:45 PM Medical Record Number: [id number] Patient Account Number: [id number] Date of Birth/Sex: Treating RN: 1965/04/26 (55 y.o. [name] [name] Primary Care Provider: Lynnea [name] Other Clinician: Referring Provider: Treating Provider/Extender: [name] [name] in Treatment: 2 Verbal / Phone Orders: No Diagnosis Coding ICD-10 Coding Code Description [phone] Non-pressure chronic ulcer of other part of right foot with fat layer exposed G90.09 Other idiopathic peripheral autonomic neuropathy L03.115 Cellulitis of right lower limb Follow-up  Appointments Return Appointment in 1 week. Bathing/ Shower/ Hygiene May shower with protection but do not get wound dressing(s) wet. May shower and wash wound with soap and water. - on days that dressing is changed Off-Loading Removable cast walker boot to: - right foot Wound Treatment Wound #1 - Amputation Site - Transmetatarsal Wound Laterality: Plantar, Right Cleanser: Byram Ancillary Kit - 15 Day Supply (DME) (Generic) Every Other Day/15 Days Discharge Instructions: Use supplies as instructed; Kit contains: (15) Saline Bullets; (15) 3x3 Gauze; 15 pr Gloves Cleanser: Soap and Water Every Other Day/15 Days Discharge Instructions: May shower and wash wound with dial antibacterial soap and water prior to dressing change. Prim Dressing: PolyMem Silver Non-Adhesive Dressing, 4.25x4.25 in (DME) (Generic) Every Other Day/15 Days ary Discharge Instructions: Apply to wound bed as instructed Secondary Dressing: Woven Gauze Sponges 2x2 in (DME) (Generic) Every Other Day/15 Days Discharge Instructions: Apply over primary dressing as directed. Secondary Dressing: Optifoam Non-Adhesive Dressing, 4x4 in (DME) (Generic) Every Other Day/15 Days Discharge Instructions: foam donut for offloading Secured With: Kerlix Roll Sterile, 4.5x3.1 (in/yd) (DME) (Generic) Every Other Day/15 Days Discharge Instructions: Secure with Kerlix as directed. Secured With: Paper Tape, 2x10 (in/yd) (DME) (Generic) Every Other Day/15 Days Discharge Instructions: Secure dressing with tape as directed. Electronic Signature(s) Signed: 06/16/2020 5:56:44 PM By: Zandra Abts RN, BSN Signed: 06/16/2020 6:21:54 PM By: [name] Najjar MD [name] By: Zandra Abts on 06/16/2020 15:58:54 -------------------------------------------------------------------------------- Problem List Details Patient Name: Date of Service: [name] [name] 06/16/2020 2:45 PM Medical Record Number: [id number] Patient Account Number: [id number] Date of  Birth/Sex: Treating RN: March 23, 1966 (55 y.o. [name] [name] Primary Care Provider: Lynnea [name] Other Clinician: Referring Provider: Treating Provider/Extender: [name] [name] in Treatment: 2 Active Problems ICD-10 Encounter Code Description Active Date MDM Diagnosis [phone] Non-pressure chronic ulcer of other part of right foot with fat layer exposed 06/02/2020 No Yes G90.09 Other idiopathic peripheral autonomic neuropathy 06/02/2020 No Yes Inactive Problems ICD-10 Code Description Active Date Inactive Date L03.115 Cellulitis of right lower limb 06/09/2020 06/09/2020 Resolved Problems Electronic Signature(s) Signed: 06/16/2020 6:21:54 PM By: [name] Najjar MD [name] By: [name] [name] on 06/16/2020 17:31:01 -------------------------------------------------------------------------------- Progress Note Details Patient  Name: Date of Service: [name] [name], [name] [name] 06/16/2020 2:45 PM Medical Record Number: [id number] Patient Account Number: [id number] Date of Birth/Sex: Treating RN: [birth year] (55 y.o. [name] [name] Primary Care Provider: Jenna [name] Other Clinician: Referring Provider: Treating Provider/Extender: [name] [name] in Treatment: 2 Subjective History of Present Illness (HPI) ADMISSION 06/02/2020 This is a 55 year old woman who has had problems with wounds on her predominantly plantar right foot for quite a period of time. 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 More recently she has had an area developed in the right mid foot at roughly the third metatarsal head. She says this started as a blister of November. Its not been closing. More recently she has been using Silvadene cream she has a cam boot to offload. I note she walks with a cane The patient has had arterial studies done in 2019 at which time her  ABI in the right was 1.28 with triphasic waveforms noncompressible on the left with triphasic waveform. Her ABI in our clinic today was 1.3 on the right. 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 She had a foot gastrocnemius recession sometime in the fall 2021 2/21; patient arrives today with a wound measuring slightly smaller 3 x 3 mm however there is circumferential area of erythema around this. She does not have any complaints 2/28; plantar wound on the right first transmetatarsal amputation site. Small oval-shaped wound raised skin around the edges. We have been using Hydrofera Blue. She does not have arterial issues Objective Constitutional Sitting or standing Blood Pressure is within target range for patient.. Pulse regular and within target range for patient.Marland Kitchen Respirations regular, non-labored and within target range.. Temperature is normal and within the target range for the patient.Marland Kitchen Appears in no distress. Vitals Time Taken: 2:55 PM, Height: 64 in, Weight: 228 lbs, BMI: 39.1, Temperature: 97.8 F, Pulse: 82 bpm, Respiratory Rate: 17 breaths/min, Blood Pressure: 118/76 mmHg. General Notes: Wound exam; circular wound over the third met head. As with not making any progress with this I did a reasonably aggressive debridement with a #3 curette removing the rim of skin and subcutaneous tissue from around the wound margins and debris on the surface. This cleans up quite nicely. Notable for the fact that the erythema around the wound from last week is still present I think this is more of a pressure issue in her cam boot than anything else. Integumentary (Hair, Skin) Wound #1 status is Open. Original cause of wound was Gradually Appeared. The date acquired was: 03/05/2020. The wound has been in treatment 2 weeks. The wound is located on the Right,Plantar Amputation Site -  Transmetatarsal. The wound measures 0.7cm length x 0.6cm width x 0.2cm depth; 0.33cm^2 area and 0.066cm^3 volume. There is Fat Layer (Subcutaneous Tissue) exposed. There is no tunneling or undermining noted. There is a medium amount of serosanguineous drainage noted. The wound margin is distinct with the outline attached to the wound base. There is large (67-100%) red, pink granulation within the wound bed. There is no necrotic tissue within the wound bed. Assessment Active Problems ICD-10 Non-pressure chronic ulcer of other part of right foot with fat layer exposed Other idiopathic peripheral autonomic neuropathy Procedures Wound #1 Pre-procedure diagnosis of Wound #1 is a Neuropathic Ulcer-Non  Diabetic located on the Right,Plantar Amputation Site - Transmetatarsal . There was a Excisional Skin/Subcutaneous Tissue Debridement with a total area of 0.42 sq cm performed by [name] [name]., MD. With the following instrument(s): Curette to remove Viable and Non-Viable tissue/material. Material removed includes Subcutaneous Tissue and Slough and. No specimens were taken. A time out was conducted at 15:55, prior to the start of the procedure. A Moderate amount of bleeding was controlled with Silver Nitrate. The procedure was tolerated well with a pain level of 0 throughout and a pain level of 0 following the procedure. Post Debridement Measurements: 0.7cm length x 0.6cm width x 0.2cm depth; 0.066cm^3 volume. Character of Wound/Ulcer Post Debridement is improved. Post procedure Diagnosis Wound #1: Same as Pre-Procedure Plan Follow-up Appointments: Return Appointment in 1 week. Bathing/ Shower/ Hygiene: May shower with protection but do not get wound dressing(s) wet. May shower and wash wound with soap and water. - on days that dressing is changed Off-Loading: Removable cast walker boot to: - right foot WOUND #1: - Amputation Site - Transmetatarsal Wound Laterality: Plantar, Right Cleanser:  Byram Ancillary Kit - 15 Day Supply (DME) (Generic) Every Other Day/15 Days Discharge Instructions: Use supplies as instructed; Kit contains: (15) Saline Bullets; (15) 3x3 Gauze; 15 pr Gloves Cleanser: Soap and Water Every Other Day/15 Days Discharge Instructions: May shower and wash wound with dial antibacterial soap and water prior to dressing change. Prim Dressing: PolyMem Silver Non-Adhesive Dressing, 4.25x4.25 in (DME) (Generic) Every Other Day/15 Days ary Discharge Instructions: Apply to wound bed as instructed Secondary Dressing: Woven Gauze Sponges 2x2 in (DME) (Generic) Every Other Day/15 Days Discharge Instructions: Apply over primary dressing as directed. Secondary Dressing: Optifoam Non-Adhesive Dressing, 4x4 in (DME) (Generic) Every Other Day/15 Days Discharge Instructions: foam donut for offloading Secured With: Kerlix Roll Sterile, 4.5x3.1 (in/yd) (DME) (Generic) Every Other Day/15 Days Discharge Instructions: Secure with Kerlix as directed. Secured With: Paper T ape, 2x10 (in/yd) (DME) (Generic) Every Other Day/15 Days Discharge Instructions: Secure dressing with tape as directed. #1 I have changed the primary dressing to polymen silver 2. The patient asked about a total contact cast apparently this was being planned for her by Dr. Earleen Newport at some point. The problem is the size of her remaining foot is very small. I she certainly would not be able to use any of the existing walking portions of the cast. We will see if there is something that we might be able to obtain that would be more suitable because I think a cast probably would be a good thing for her. 3. After looking at her foot again this week I do not think I was correct last week in terms of cellulitis. I think this is more of a friction issue in her cam boot Electronic Signature(s) Signed: 06/16/2020 6:21:54 PM By: [name] Ham MD [name] [name] on 06/16/2020  17:36:22 -------------------------------------------------------------------------------- SuperBill Details Patient Name: Date of Service: [name] [name] 06/16/2020 Medical Record Number: [id number] Patient Account Number: [id number] Date of Birth/Sex: Treating RN: 11-12-1965 (55 y.o. [name] [name] Primary Care Provider: Jenna [name] Other Clinician: Referring Provider: Treating Provider/Extender: [name] [name] in Treatment: 2 Diagnosis Coding ICD-10 Codes Code Description [phone] Non-pressure chronic ulcer of other part of right foot with fat layer exposed G90.09 Other idiopathic peripheral autonomic neuropathy Facility Procedures CPT4 Code: [id number] Description: 62229 - DEB SUBQ TISSUE 20 SQ CM/< ICD-10 Diagnosis Description L97.512 Non-pressure chronic ulcer of other part of right foot with fat layer exposed  Modifier: Quantity: 1 Physician Procedures : CPT4 Code Description Modifier [id number] - WC PHYS SUBQ TISS 20 SQ CM ICD-10 Diagnosis Description L97.512 Non-pressure chronic ulcer of other part of right foot with fat layer exposed Quantity: 1 Electronic Signature(s) Signed: 06/16/2020 6:21:54 PM By: [name] Ham MD [name] [name] on 06/16/2020 17:37:24

## 2020-06-17 NOTE — Progress Notes (Signed)
RUCHEL, BRANDENBURGER ([id number]) Visit Report for 06/16/2020 Arrival Information Details Patient Name: Date of Service: [name] [name], [name] [name] 06/16/2020 2:45 PM Medical Record Number: [id number] Patient Account Number: [id number] Date of Birth/Sex: Treating RN: 05-22-1965 (55 y.o. [name] [name] Primary Care Provider: Jenna Luo Other Clinician: Referring Provider: Treating Provider/Extender: Rosezena Sensor in Treatment: 2 Visit Information History Since Last Visit Added or deleted any medications: No Patient Arrived: [name] Any new allergies or adverse reactions: No Arrival Time: 14:54 Had a fall or experienced change in No Accompanied By: self activities of daily living that may affect Transfer Assistance: None risk of falls: Patient Identification Verified: Yes Signs or symptoms of abuse/neglect since last visito No Secondary Verification Process Completed: Yes Hospitalized since last visit: No Patient Requires Transmission-Based Precautions: No Implantable device outside of the clinic excluding No Patient Has Alerts: No cellular tissue based products placed in the center since last visit: Has Dressing in Place as Prescribed: Yes Pain Present Now: No Electronic Signature(s) Signed: 06/17/2020 8:29:17 AM By: Sandre Kitty Entered By: Sandre Kitty on 06/16/2020 14:55:45 -------------------------------------------------------------------------------- Encounter Discharge Information Details Patient Name: Date of Service: [name] Shutter D. 06/16/2020 2:45 PM Medical Record Number: [id number] Patient Account Number: [id number] Date of Birth/Sex: Treating RN: 09-26-1965 (55 y.o. [name] [name] Primary Care Provider: Jenna Luo Other Clinician: Referring Provider: Treating Provider/Extender: Rosezena Sensor in Treatment: 2 Encounter Discharge Information Items Post Procedure Vitals Discharge Condition:  Stable Temperature (F): 97.8 Ambulatory Status: Cane Pulse (bpm): 82 Discharge Destination: Home Respiratory Rate (breaths/min): 18 Transportation: Private Auto Blood Pressure (mmHg): 118/76 Accompanied By: self Schedule Follow-up Appointment: Yes Clinical Summary of Care: Electronic Signature(s) Signed: 06/16/2020 5:39:38 PM By: Deon Pilling Entered By: Deon Pilling on 06/16/2020 17:36:29 -------------------------------------------------------------------------------- Lower Extremity Assessment Details Patient Name: Date of Service: [name] [name] 06/16/2020 2:45 PM Medical Record Number: [id number] Patient Account Number: [id number] Date of Birth/Sex: Treating RN: Feb 03, 1966 (55 y.o. Tonita Phoenix, Lauren Primary Care Provider: Jenna Luo Other Clinician: Referring Provider: Treating Provider/Extender: Rosezena Sensor in Treatment: 2 Edema Assessment Assessed: Shirlyn Goltz: No] Patrice Paradise: Yes] Edema: [Left: N] [Right: o] Calf Left: Right: Point of Measurement: 34 cm From Medial Instep 38 cm Ankle Left: Right: Point of Measurement: 9 cm From Medial Instep 25 cm Vascular Assessment Pulses: Dorsalis Pedis Palpable: [Right:Yes] Posterior Tibial Palpable: [Right:Yes] Electronic Signature(s) Signed: 06/17/2020 5:58:27 PM By: Rhae Hammock RN Entered By: Rhae Hammock on 06/16/2020 15:04:24 -------------------------------------------------------------------------------- Multi Wound Chart Details Patient Name: Date of Service: [name] Shutter D. 06/16/2020 2:45 PM Medical Record Number: [id number] Patient Account Number: [id number] Date of Birth/Sex: Treating RN: 03-06-1966 (55 y.o. 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] [N/A:N/A] Primary Etiology: [1:Asthma, Sleep Apnea, Hypertension, N/A] Comorbid History: [1:Osteoarthritis, Neuropathy, Seizure Disorder 03/05/2020] [N/A:N/A] Date Acquired: [1:2] [N/A:N/A] Weeks of Treatment: [1:Open] [N/A:N/A] Wound Status: [1:0.7x0.6x0.2] [N/A:N/A] Measurements L x W x D (cm) [1:0.33] [N/A:N/A] A (cm) : rea [1:0.066] [N/A:N/A] Volume (cm) : [1:58.00%] [N/A:N/A] % Reduction in A [1:rea: 72.00%] [N/A:N/A] % Reduction in Volume: [1:Full Thickness Without Exposed] [N/A:N/A] Classification: [1:Support Structures Medium] [N/A:N/A] Exudate A mount: [1:Serosanguineous] [N/A:N/A] Exudate Type: [1:red, Haevyn Ury] [N/A:N/A] Exudate Color: [1:Distinct, outline  attached] [N/A:N/A] Wound Margin: [1:Large (67-100%)] [N/A:N/A] Granulation A mount: [1:Red, Pink] [N/A:N/A] Granulation Quality: [1:None Present (0%)] [N/A:N/A] Necrotic A mount: [1:Fat Layer (Subcutaneous Tissue): Yes N/A] Exposed Structures: [1:Fascia: No Tendon: No Muscle: No Joint: No Bone: No Small (1-33%)] [N/A:N/A] Epithelialization: [1:Debridement - Excisional] [N/A:N/A] Debridement: Pre-procedure Verification/Time Out 15:55 [N/A:N/A] Taken: [1:Subcutaneous, Slough] [N/A:N/A] Tissue Debrided: [1:Skin/Subcutaneous Tissue] [N/A:N/A] Level: [1:0.42] [N/A:N/A] Debridement A (sq cm): [1:rea Curette] [N/A:N/A] Instrument: [1:Moderate] [N/A:N/A] Bleeding: [1:Silver Nitrate] [N/A:N/A] Hemostasis A chieved: [1:0] [N/A:N/A] Procedural Pain: [1:0] [N/A:N/A] Post Procedural Pain: [1:Procedure was tolerated well] [N/A:N/A] Debridement Treatment Response: [1:0.7x0.6x0.2] [N/A:N/A] Post Debridement Measurements L x W x D (cm) [1:0.066] [N/A:N/A] Post  Debridement Volume: (cm) [1:Debridement] [N/A:N/A] Treatment Notes Electronic Signature(s) Signed: 06/16/2020 5:56:44 PM By: Zandra Abts RN, BSN Signed: 06/16/2020 6:21:54 PM By: Baltazar Najjar MD Entered By: Baltazar Najjar on 06/16/2020 17:31:09 -------------------------------------------------------------------------------- Multi-Disciplinary Care Plan Details Patient Name: Date of Service: [name] 06/16/2020 2:45 PM Medical Record Number: [id number] Patient Account Number: [id number] Date of Birth/Sex: Treating RN: 1965-09-16 (55 y.o. Wynelle Link Primary Care Provider: Lynnea Ferrier Other Clinician: Referring Provider: Treating Provider/Extender: Princella Pellegrini in Treatment: 2 Multidisciplinary Care Plan reviewed with physician Active Inactive Abuse / Safety / Falls / Self Care Management Nursing Diagnoses: History of Falls Potential for injury related to falls Goals: Patient will not experience any injury related to falls Date Initiated: 06/02/2020 Target Resolution Date: 07/04/2020 Goal Status: Active Patient/caregiver will verbalize/demonstrate measures taken to prevent injury and/or falls Date Initiated: 06/02/2020 Target Resolution Date: 07/04/2020 Goal Status: Active Interventions: Assess Activities of Daily Living upon admission and as needed Assess fall risk on admission and as needed Assess: immobility, friction, shearing, incontinence upon admission and as needed Assess impairment of mobility on admission and as needed per policy Assess personal safety and home safety (as indicated) on admission and as needed Assess self care needs on admission and as needed Provide education on personal and home safety Notes: Wound/Skin Impairment Nursing Diagnoses: Impaired tissue integrity Knowledge deficit related to ulceration/compromised skin integrity Goals: Patient/caregiver will verbalize understanding of skin care  regimen Date Initiated: 06/02/2020 Target Resolution Date: 07/04/2020 Goal Status: Active Ulcer/skin breakdown will have a volume reduction of 30% by week 4 Date Initiated: 06/02/2020 Target Resolution Date: 07/04/2020 Goal Status: Active Interventions: Assess patient/caregiver ability to obtain necessary supplies Assess patient/caregiver ability to perform ulcer/skin care regimen upon admission and as needed Assess ulceration(s) every visit Provide education on ulcer and skin care Notes: Electronic Signature(s) Signed: 06/16/2020 5:56:44 PM By: Zandra Abts RN, BSN Entered By: Zandra Abts on 06/16/2020 17:25:32 -------------------------------------------------------------------------------- Pain Assessment Details Patient Name: Date of Service: [name] [name] 06/16/2020 2:45 PM Medical Record Number: [id number] Patient Account Number: [id number] Date of Birth/Sex: Treating RN: [birth year] (55 y.o. Wynelle Link Primary Care Provider: Lynnea Ferrier Other Clinician: Referring Provider: Treating Provider/Extender: Princella Pellegrini in Treatment: 2 Active Problems Location of Pain Severity and Description of Pain Patient Has Paino No Site Locations Pain Management and Medication Current Pain Management: Electronic Signature(s) Signed: 06/16/2020 5:56:44 PM By: Zandra Abts RN, BSN Signed: 06/17/2020 8:29:17 AM By: Karl Ito Entered By: Karl Ito on 06/16/2020 14:56:07 -------------------------------------------------------------------------------- Patient/Caregiver Education Details Patient Name: Date of Service: [name] [name] 2/28/2022andnbsp2:45 PM Medical Record Number: [id number] Patient Account Number: [id number] Date of Birth/Gender: Treating RN: 09-04-1965 (55 y.o. Wynelle Link Primary Care Physician: Lynnea Ferrier Other Clinician: Referring Physician: Treating Physician/Extender: Princella Pellegrini in Treatment:  2 Education Assessment Education Provided To: Patient Education Topics Provided Wound/Skin Impairment: Methods: Explain/Verbal Responses: State content correctly Electronic Signature(s) Signed: 06/16/2020 5:56:44 PM By: Levan Hurst RN, BSN Entered By: Levan Hurst on 06/16/2020 17:25:42 -------------------------------------------------------------------------------- Wound Assessment Details Patient Name: Date of Service: [name] [name] 06/16/2020 2:45 PM Medical Record Number: [id number] Patient Account Number: [id number] Date of Birth/Sex: Treating RN: 1965/08/05 (55 y.o. 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, Plantar Amputation Site - Transmetatarsal Wound Open Wounding Event: Gradually Appeared Status: Date Acquired: 03/05/2020 Comorbid Asthma, Sleep Apnea, Hypertension, Osteoarthritis, Weeks Of Treatment: 2 History: Neuropathy, Seizure Disorder Clustered Wound: No Photos Wound Measurements Length: (cm) 0.7 Width: (cm) 0.6 Depth: (cm) 0.2 Area: (cm) 0.33 Volume: (cm) 0.066 % Reduction in Area: 58% % Reduction in Volume: 72% Epithelialization: Small (1-33%) Tunneling: No Undermining: No Wound Description Classification: Full Thickness Without Exposed Support Structures Wound Margin: Distinct, outline attached Exudate Amount: Medium Exudate Type: Serosanguineous Exudate Color: red, Cindi Ghazarian Foul Odor After Cleansing: No Slough/Fibrino No Wound Bed Granulation Amount: Large (67-100%) Exposed Structure Granulation Quality: Red, Pink Fascia Exposed: No Necrotic Amount: None Present (0%) Fat Layer (Subcutaneous Tissue) Exposed: Yes Tendon Exposed: No Muscle Exposed: No Joint Exposed:  No Bone Exposed: No Treatment Notes Wound #1 (Amputation Site - Transmetatarsal) Wound Laterality: Plantar, Right Cleanser [place] Ancillary Kit - 15 Day Supply Discharge Instruction: Use supplies as instructed; Kit contains: (15) Saline Bullets; (15) 3x3 Gauze; 15 pr Gloves Soap and Water Discharge Instruction: May shower and wash wound with dial antibacterial soap and water prior to dressing change. Peri-Wound Care Topical Primary Dressing PolyMem Silver Non-Adhesive Dressing, 4.25x4.25 in Discharge Instruction: Apply to wound bed as instructed Secondary Dressing Woven Gauze Sponges 2x2 in Discharge Instruction: Apply over primary dressing as directed. Optifoam Non-Adhesive Dressing, 4x4 in Discharge Instruction: foam donut for offloading Secured With Kerlix Roll Sterile, 4.5x3.1 (in/yd) Discharge Instruction: Secure with Kerlix as directed. Paper Tape, 2x10 (in/yd) Discharge Instruction: Secure dressing with tape as directed. Compression Wrap Compression Stockings Add-Ons Electronic Signature(s) Signed: 06/16/2020 5:56:44 PM By: Levan Hurst RN, BSN Signed: 06/17/2020 8:29:17 AM By: Sandre Kitty Entered By: Sandre Kitty on 06/16/2020 17:00:44 -------------------------------------------------------------------------------- [place] Details Patient Name: Date of Service: [name] Shutter D. 06/16/2020 2:45 PM Medical Record Number: [id number] Patient Account Number: [id number] Date of Birth/Sex: Treating RN: [birth year] (55 y.o. [name] [name] Primary Care Provider: Jenna Luo Other Clinician: Referring Provider: Treating Provider/Extender: Rosezena Sensor in Treatment: 2 Vital Signs Time Taken: 14:55 Temperature (F): 97.8 Height (in): 64 Pulse (bpm): 82 Weight (lbs): 228 Respiratory Rate (breaths/min): 17 Body Mass Index (BMI): 39.1 Blood Pressure (mmHg): 118/76 Reference Range: 80 - 120 mg / dl Electronic Signature(s) Signed:  06/17/2020 8:29:17 AM By: Sandre Kitty Entered By: Sandre Kitty on 06/16/2020 14:56:02

## 2020-06-23 ENCOUNTER — Other Ambulatory Visit: Payer: Self-pay

## 2020-06-23 ENCOUNTER — Encounter (HOSPITAL_BASED_OUTPATIENT_CLINIC_OR_DEPARTMENT_OTHER): Payer: 59 | Attending: Internal Medicine | Admitting: Physician Assistant

## 2020-06-23 DIAGNOSIS — G9009 Other idiopathic peripheral autonomic neuropathy: Secondary | ICD-10-CM | POA: Diagnosis not present

## 2020-06-23 DIAGNOSIS — G40909 Epilepsy, unspecified, not intractable, without status epilepticus: Secondary | ICD-10-CM | POA: Diagnosis not present

## 2020-06-23 DIAGNOSIS — L97512 Non-pressure chronic ulcer of other part of right foot with fat layer exposed: Secondary | ICD-10-CM | POA: Diagnosis not present

## 2020-06-23 DIAGNOSIS — G629 Polyneuropathy, unspecified: Secondary | ICD-10-CM | POA: Insufficient documentation

## 2020-06-23 NOTE — Progress Notes (Addendum)
LAJUAN, GODBEE ([id number]) Visit Report for 06/23/2020 Chief Complaint Document Details Patient Name: Date of Service: MELISSIA, LAHMAN 06/23/2020 3:30 PM Medical Record Number: [id number] Patient Account Number: [id number] Date of Birth/Sex: Treating RN: 1965/11/16 (55 y.o. [name] Primary Care Provider: Jenna Luo Other Clinician: Referring Provider: Treating Provider/Extender: Christella Hartigan in Treatment: 3 Information Obtained from: Patient Chief Complaint 06/03/2019; patient is here for review of a wound on her plantar foot transmetatarsal amputation site at roughly the third met head Electronic Signature(s) Signed: 06/23/2020 4:15:04 PM By: Worthy Keeler PA-C Entered By: Worthy Keeler on 06/23/2020 16:15:04 -------------------------------------------------------------------------------- Debridement Details Patient Name: Date of Service: [name] 06/23/2020 3:30 PM Medical Record Number: [id number] Patient Account Number: [id number] Date of Birth/Sex: Treating RN: March 15, 1966 (55 y.o. [name] Primary Care Provider: Jenna Luo Other Clinician: Referring Provider: Treating Provider/Extender: Christella Hartigan in Treatment: 3 Debridement Performed for Assessment: Wound #1 Right,Plantar Amputation Site - Transmetatarsal Performed By: Physician Worthy Keeler, PA Debridement Type: Debridement Level of Consciousness (Pre-procedure): Awake and Alert Pre-procedure Verification/Time Out Yes - 16:56 Taken: Start Time: 16:56 T Area Debrided (L x W): otal 0.6 (cm) x 0.6 (cm) = 0.36 (cm) Tissue and other material debrided: Viable, Non-Viable, Slough, Subcutaneous, Slough Level: Skin/Subcutaneous Tissue Debridement Description: Excisional Instrument: Curette Bleeding: Minimum Hemostasis Achieved: Pressure End Time: 16:57 Procedural Pain: 0 Post Procedural Pain: 0 Response to Treatment: Procedure  was tolerated well Level of Consciousness (Post- Awake and Alert procedure): Post Debridement Measurements of Total Wound Length: (cm) 0.6 Width: (cm) 0.6 Depth: (cm) 0.3 Volume: (cm) 0.085 Character of Wound/Ulcer Post Debridement: Improved Post Procedure Diagnosis Same as Pre-procedure Electronic Signature(s) Signed: 06/23/2020 5:52:07 PM By: Worthy Keeler PA-C Signed: 06/24/2020 6:01:52 PM By: Levan Hurst RN, BSN Entered By: Levan Hurst on 06/23/2020 17:00:22 -------------------------------------------------------------------------------- HPI Details Patient Name: Date of Service: [name] 06/23/2020 3:30 PM Medical Record Number: [id number] Patient Account Number: [id number] Date of Birth/Sex: Treating RN: 07/02/1965 (55 y.o. [name] Primary Care Provider: Jenna Luo Other Clinician: Referring Provider: Treating Provider/Extender: Christella Hartigan in Treatment: 3 History of Present Illness HPI Description: ADMISSION 06/02/2020 This is a 55 year old woman who has had problems with wounds on her predominantly plantar right foot for quite a period of time. 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 More recently she has had an area developed in the right mid foot at roughly the third metatarsal head. She says this started as a blister of November. Its not been closing. More recently she has been using Silvadene cream she has a cam boot to offload. I note she walks with a cane The patient has had arterial studies done in 2019 at which time her ABI in the right was 1.28 with triphasic waveforms noncompressible on the left with triphasic waveform. Her ABI in our clinic today was 1.3 on the right. 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 She had a foot gastrocnemius recession sometime in the fall 2021 2/21; patient arrives today with a wound measuring slightly smaller 3 x 3 mm however there is circumferential area of erythema around this. She does not have any complaints 2/28; plantar wound on  the right first transmetatarsal amputation site. Small oval-shaped wound raised skin around the edges. We have been using Hydrofera Blue. She does not have arterial issues 06/23/2020 upon evaluation today patient appears to be doing decently well. With regard to her wound. There is little bit of slough and some hyper granulation. I do not feel like the PolyMem did too well for her. In fact she may do better with some Hydrofera Blue. I know we just switch from that but I feel like that may be a better way to continue currently. Electronic Signature(s) Signed: 06/23/2020 5:05:26 PM By: Worthy Keeler PA-C Entered By: Worthy Keeler on 06/23/2020 17:05:26 -------------------------------------------------------------------------------- Physical Exam Details Patient Name: Date of Service: TUWANDA, VOKES 06/23/2020 3:30 PM Medical Record Number: [id number] Patient Account Number: [id number] Date of Birth/Sex: Treating RN: December 03, 1965 (55 y.o. [name] Primary Care Provider: Jenna Luo Other Clinician: Referring Provider: Treating Provider/Extender: Christella Hartigan in Treatment: 3 Constitutional Well-nourished and well-hydrated in no acute distress. Respiratory normal breathing without difficulty. Psychiatric this patient is able to make decisions and demonstrates good insight into disease process. Alert and Oriented x 3. pleasant and cooperative. Notes Upon inspection patient's wound bed actually showed signs of further granulation there was some necrotic tissue to clear away including some hypergranulation slough and biofilm. All this was done today without complication  post debridement wound bed appears to be doing much better which is great news. She does want to get into the total contact cast and to be honest I do not see any signs of infection there is a little bit of erythema on the distal portion of the foot but I really feel like this may be more due to friction and/or pressure. Nonetheless we are still trying to see about getting a transmetatarsal boot. We did check the regular mood I am a little concerned that it might not situate well on her foot is full is holding everything in place but it very well may. Electronic Signature(s) Signed: 06/23/2020 5:06:12 PM By: Worthy Keeler PA-C Entered By: Worthy Keeler on 06/23/2020 17:06:11 -------------------------------------------------------------------------------- Physician Orders Details Patient Name: Date of Service: [name] 06/23/2020 3:30 PM Medical Record Number: [id number] Patient Account Number: [id number] Date of Birth/Sex: Treating RN: 1965-12-07 (55 y.o. [name] Primary Care Provider: Jenna Luo Other Clinician: Referring Provider: Treating Provider/Extender: Christella Hartigan in Treatment: 3 Verbal / Phone Orders: No Diagnosis Coding ICD-10 Coding Code Description [phone] Non-pressure chronic ulcer of other part of right foot with fat layer exposed G90.09 Other idiopathic peripheral autonomic neuropathy Follow-up Appointments Return Appointment in 1 week. Bathing/ Shower/ Hygiene May shower with protection but do not get wound dressing(s) wet. May shower and wash wound with soap and water. - on days that dressing is changed Off-Loading Removable cast walker boot to: - right foot Wound Treatment Wound #1 - Amputation Site - Transmetatarsal Wound Laterality: Plantar, Right Cleanser: Soap and Water Every Other Day/15 Days Discharge Instructions: May shower and wash wound with dial antibacterial soap and water prior to dressing change. Prim  Dressing: Hydrofera Blue Ready Foam, 2.5 x2.5 in Every Other Day/15 Days ary Discharge Instructions: Apply to wound bed as instructed Secondary Dressing: Woven Gauze Sponges 2x2 in (Generic) Every Other Day/15 Days Discharge Instructions: Apply over primary dressing as directed. Secondary Dressing: Optifoam Non-Adhesive Dressing, 4x4 in (Generic) Every Other Day/15 Days Discharge Instructions: foam donut for offloading Secured With: Kerlix Roll Sterile, 4.5x3.1 (  in/yd) (Generic) Every Other Day/15 Days Discharge Instructions: Secure with Kerlix as directed. Secured With: Paper Tape, 2x10 (in/yd) (Generic) Every Other Day/15 Days Discharge Instructions: Secure dressing with tape as directed. Electronic Signature(s) Signed: 06/23/2020 5:52:07 PM By: Worthy Keeler PA-C Signed: 06/24/2020 6:01:52 PM By: Levan Hurst RN, BSN Entered By: Levan Hurst on 06/23/2020 17:02:31 -------------------------------------------------------------------------------- Problem List Details Patient Name: Date of Service: [name] 06/23/2020 3:30 PM Medical Record Number: [id number] Patient Account Number: [id number] Date of Birth/Sex: Treating RN: 02-01-1966 (55 y.o. [name] Primary Care Provider: Jenna Luo Other Clinician: Referring Provider: Treating Provider/Extender: Christella Hartigan in Treatment: 3 Active Problems ICD-10 Encounter Code Description Active Date MDM Diagnosis [phone] Non-pressure chronic ulcer of other part of right foot with fat layer exposed 06/02/2020 No Yes G90.09 Other idiopathic peripheral autonomic neuropathy 06/02/2020 No Yes Inactive Problems ICD-10 Code Description Active Date Inactive Date L03.115 Cellulitis of right lower limb 06/09/2020 06/09/2020 Resolved Problems Electronic Signature(s) Signed: 06/23/2020 4:14:54 PM By: Worthy Keeler PA-C Entered By: Worthy Keeler on 06/23/2020  16:14:54 -------------------------------------------------------------------------------- Progress Note Details Patient Name: Date of Service: [name] 06/23/2020 3:30 PM Medical Record Number: [id number] Patient Account Number: [id number] Date of Birth/Sex: Treating RN: Nov 24, 1965 (55 y.o. [name] Primary Care Provider: Jenna Luo Other Clinician: Referring Provider: Treating Provider/Extender: Christella Hartigan in Treatment: 3 Subjective Chief Complaint Information obtained from Patient 06/03/2019; patient is here for review of a wound on her plantar foot transmetatarsal amputation site at roughly the third met head History of Present Illness (HPI) ADMISSION 06/02/2020 This is a 55 year old woman who has had problems with wounds on her predominantly plantar right foot for quite a period of time. 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 More recently she has had an area developed in the right mid foot at roughly the third metatarsal head. She says this started as a blister of November. Its not been closing. More recently she has been using Silvadene cream she has a cam boot to offload. I note she walks with a cane The patient has had arterial studies done in 2019 at which time her ABI in the right was 1.28 with triphasic waveforms noncompressible on the left with triphasic waveform. Her ABI in our clinic today was 1.3 on the right. 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 She had a foot gastrocnemius recession sometime in the fall 2021 2/21; patient arrives today with a wound measuring slightly smaller 3 x 3 mm however there is circumferential area of erythema around this. She does not have any complaints 2/28; plantar  wound on the right first transmetatarsal amputation site. Small oval-shaped wound raised skin around the edges. We have been using Hydrofera Blue. She does not have arterial issues 06/23/2020 upon evaluation today patient appears to be doing decently well. With regard to her wound. There is little bit of slough and some hyper granulation. I do not feel like the PolyMem did too well for her. In fact she may do better with some Hydrofera Blue. I know we just switch from that but I feel like that may be a better way to continue currently. Objective Constitutional Well-nourished and well-hydrated in no acute distress. Vitals  Time Taken: 3:58 PM, Height: 64 in, Weight: 228 lbs, BMI: 39.1, Temperature: 98.2 F, Pulse: 81 bpm, Respiratory Rate: 17 breaths/min, Blood Pressure: 124/82 mmHg. Respiratory normal breathing without difficulty. Psychiatric this patient is able to make decisions and demonstrates good insight into disease process. Alert and Oriented x 3. pleasant and cooperative. General Notes: Upon inspection patient's wound bed actually showed signs of further granulation there was some necrotic tissue to clear away including some hypergranulation slough and biofilm. All this was done today without complication post debridement wound bed appears to be doing much better which is great news. She does want to get into the total contact cast and to be honest I do not see any signs of infection there is a little bit of erythema on the distal portion of the foot but I really feel like this may be more due to friction and/or pressure. Nonetheless we are still trying to see about getting a transmetatarsal boot. We did check the regular mood I am a little concerned that it might not situate well on her foot is full is holding everything in place but it very well may. Integumentary (Hair, Skin) Wound #1 status is Open. Original cause of wound was Gradually Appeared. The date acquired was: 03/05/2020. The  wound has been in treatment 3 weeks. The wound is located on the Right,Plantar Amputation Site - Transmetatarsal. The wound measures 0.6cm length x 0.6cm width x 0.3cm depth; 0.283cm^2 area and 0.085cm^3 volume. There is Fat Layer (Subcutaneous Tissue) exposed. There is no tunneling or undermining noted. There is a medium amount of serosanguineous drainage noted. The wound margin is distinct with the outline attached to the wound base. There is large (67-100%) red, pink granulation within the wound bed. There is no necrotic tissue within the wound bed. Assessment Active Problems ICD-10 Non-pressure chronic ulcer of other part of right foot with fat layer exposed Other idiopathic peripheral autonomic neuropathy Procedures Wound #1 Pre-procedure diagnosis of Wound #1 is a Neuropathic Ulcer-Non Diabetic located on the Right,Plantar Amputation Site - Transmetatarsal . There was a Excisional Skin/Subcutaneous Tissue Debridement with a total area of 0.36 sq cm performed by Lenda Kelp, PA. With the following instrument(s): Curette to remove Viable and Non-Viable tissue/material. Material removed includes Subcutaneous Tissue and Slough and. No specimens were taken. A time out was conducted at 16:56, prior to the start of the procedure. A Minimum amount of bleeding was controlled with Pressure. The procedure was tolerated well with a pain level of 0 throughout and a pain level of 0 following the procedure. Post Debridement Measurements: 0.6cm length x 0.6cm width x 0.3cm depth; 0.085cm^3 volume. Character of Wound/Ulcer Post Debridement is improved. Post procedure Diagnosis Wound #1: Same as Pre-Procedure Plan Follow-up Appointments: Return Appointment in 1 week. Bathing/ Shower/ Hygiene: May shower with protection but do not get wound dressing(s) wet. May shower and wash wound with soap and water. - on days that dressing is changed Off-Loading: Removable cast walker boot to: - right  foot WOUND #1: - Amputation Site - Transmetatarsal Wound Laterality: Plantar, Right Cleanser: Soap and Water Every Other Day/15 Days Discharge Instructions: May shower and wash wound with dial antibacterial soap and water prior to dressing change. Prim Dressing: Hydrofera Blue Ready Foam, 2.5 x2.5 in Every Other Day/15 Days ary Discharge Instructions: Apply to wound bed as instructed Secondary Dressing: Woven Gauze Sponges 2x2 in (Generic) Every Other Day/15 Days Discharge Instructions: Apply over primary dressing as directed. Secondary Dressing: Optifoam Non-Adhesive Dressing, 4x4  in (Generic) Every Other Day/15 Days Discharge Instructions: foam donut for offloading Secured With: Kerlix Roll Sterile, 4.5x3.1 (in/yd) (Generic) Every Other Day/15 Days Discharge Instructions: Secure with Kerlix as directed. Secured With: Paper T ape, 2x10 (in/yd) (Generic) Every Other Day/15 Days Discharge Instructions: Secure dressing with tape as directed. 1. Would recommend currently that we go ahead and go back to the [facility] I think that is can be a good way to go at this point. 2. I am also can recommend that we have the patient go ahead and continue with appropriate offloading shoes in a cam walker boot at this point we will continue with that. 3. With regard to the total contact cast she very well may do okay with a regular boot but work in a try to see about getting the transmetatarsal boot for the cast to go into due to the small size of her foot. I feel like this may be safer we will look into that for next week. We will see patient back for reevaluation in 1 week here in the clinic. If anything worsens or changes patient will contact our office for additional recommendations. Electronic Signature(s) Signed: 06/23/2020 5:06:50 PM By: Worthy Keeler PA-C Entered By: Worthy Keeler on 06/23/2020 17:06:50 -------------------------------------------------------------------------------- SuperBill  Details Patient Name: Date of Service: [name] 06/23/2020 Medical Record Number: [id number] Patient Account Number: [id number] Date of Birth/Sex: Treating RN: 12-May-1965 (55 y.o. [name] Primary Care Provider: Jenna Luo Other Clinician: Referring Provider: Treating Provider/Extender: Christella Hartigan in Treatment: 3 Diagnosis Coding ICD-10 Codes Code Description [phone] Non-pressure chronic ulcer of other part of right foot with fat layer exposed G90.09 Other idiopathic peripheral autonomic neuropathy Facility Procedures CPT4 Code: [id number] Description: 94327 - DEB SUBQ TISSUE 20 SQ CM/< ICD-10 Diagnosis Description L97.512 Non-pressure chronic ulcer of other part of right foot with fat layer exposed Modifier: Quantity: 1 Physician Procedures Electronic Signature(s) Signed: 06/23/2020 5:06:56 PM By: Worthy Keeler PA-C Entered By: Worthy Keeler on 06/23/2020 17:06:55

## 2020-06-25 NOTE — Progress Notes (Signed)
DAKAYLA, DISANTI ([id number]) Visit Report for 06/23/2020 Arrival Information Details Patient Name: Date of Service: AASHKA, SALOMONE 06/23/2020 3:30 PM Medical Record Number: [id number] Patient Account Number: [id number] Date of Birth/Sex: Treating RN: 1965-07-30 (55 y.o. Tonita Phoenix, Lauren Primary Care Mariama Saintvil: Jenna Luo Other Clinician: Referring Val Schiavo: Treating Ceazia Harb/Extender: Christella Hartigan in Treatment: 3 Visit Information History Since Last Visit Added or deleted any medications: No Patient Arrived: [name] Any new allergies or adverse reactions: No Arrival Time: 15:56 Had a fall or experienced change in No Accompanied By: self activities of daily living that may affect Transfer Assistance: None risk of falls: Patient Identification Verified: Yes Signs or symptoms of abuse/neglect since last visito No Secondary Verification Process Completed: Yes Hospitalized since last visit: No Patient Requires Transmission-Based Precautions: No Implantable device outside of the clinic excluding No Patient Has Alerts: No cellular tissue based products placed in the center since last visit: Has Dressing in Place as Prescribed: Yes Pain Present Now: Yes Electronic Signature(s) Signed: 06/23/2020 5:34:21 PM By: Rhae Hammock RN Entered By: Rhae Hammock on 06/23/2020 15:56:50 -------------------------------------------------------------------------------- Encounter Discharge Information Details Patient Name: Date of Service: [name] 06/23/2020 3:30 PM Medical Record Number: [id number] Patient Account Number: [id number] Date of Birth/Sex: Treating RN: [birth year] (55 y.o. [name] Primary Care Renaud Celli: Jenna Luo Other Clinician: Referring Romy Mcgue: Treating Oronde Hallenbeck/Extender: Christella Hartigan in Treatment: 3 Encounter Discharge Information Items Post Procedure Vitals Discharge Condition:  Stable Temperature (F): 98.2 Ambulatory Status: Cane Pulse (bpm): 81 Discharge Destination: Home Respiratory Rate (breaths/min): 17 Transportation: Private Auto Blood Pressure (mmHg): 124/82 Accompanied By: self Schedule Follow-up Appointment: Yes Clinical Summary of Care: Electronic Signature(s) Signed: 06/23/2020 5:26:37 PM By: Deon Pilling Entered By: Deon Pilling on 06/23/2020 17:24:36 -------------------------------------------------------------------------------- Lower Extremity Assessment Details Patient Name: Date of Service: LINDSEE, LABARRE 06/23/2020 3:30 PM Medical Record Number: [id number] Patient Account Number: [id number] Date of Birth/Sex: Treating RN: 09/25/1965 (55 y.o. Tonita Phoenix, Lauren Primary Care Umer Harig: Jenna Luo Other Clinician: Referring Keimon Basaldua: Treating Shadiamond Koska/Extender: Christella Hartigan in Treatment: 3 Edema Assessment Assessed: [Left: No] [Right: Yes] Edema: [Left: N] [Right: o] Calf Left: Right: Point of Measurement: 34 cm From Medial Instep 38 cm Ankle Left: Right: Point of Measurement: 9 cm From Medial Instep 25 cm Vascular Assessment Pulses: Dorsalis Pedis Palpable: [Right:Yes] Posterior Tibial Palpable: [Right:Yes] Electronic Signature(s) Signed: 06/23/2020 5:34:21 PM By: Rhae Hammock RN Entered By: Rhae Hammock on 06/23/2020 16:01:23 -------------------------------------------------------------------------------- [place] Details Patient Name: Date of Service: [name] 06/23/2020 3:30 PM Medical Record Number: [id number] Patient Account Number: [id number] Date of Birth/Sex: Treating RN: 1965-04-21 (55 y.o. [name] Primary Care Theodora Lalanne: Jenna Luo Other Clinician: Referring Maylea Soria: Treating Malaia Buchta/Extender: Christella Hartigan in Treatment: 3 Multidisciplinary Care Plan reviewed with physician Active Inactive Abuse /  Safety / Falls / Self Care Management Nursing Diagnoses: History of Falls Potential for injury related to falls Goals: Patient will not experience any injury related to falls Date Initiated: 06/02/2020 Target Resolution Date: 07/04/2020 Goal Status: Active Patient/caregiver will verbalize/demonstrate measures taken to prevent injury and/or falls Date Initiated: 06/02/2020 Target Resolution Date: 07/04/2020 Goal Status: Active Interventions: Assess Activities of Daily Living upon admission and as needed Assess fall risk on admission and as needed Assess: immobility, friction, shearing, incontinence upon admission and as needed Assess impairment of mobility on admission and as needed per policy Assess personal safety and home safety (as indicated) on admission  and as needed Assess self care needs on admission and as needed Provide education on personal and home safety Notes: Wound/Skin Impairment Nursing Diagnoses: Impaired tissue integrity Knowledge deficit related to ulceration/compromised skin integrity Goals: Patient/caregiver will verbalize understanding of skin care regimen Date Initiated: 06/02/2020 Target Resolution Date: 07/04/2020 Goal Status: Active Ulcer/skin breakdown will have a volume reduction of 30% by week 4 Date Initiated: 06/02/2020 Target Resolution Date: 07/04/2020 Goal Status: Active Interventions: Assess patient/caregiver ability to obtain necessary supplies Assess patient/caregiver ability to perform ulcer/skin care regimen upon admission and as needed Assess ulceration(s) every visit Provide education on ulcer and skin care Notes: Electronic Signature(s) Signed: 06/24/2020 6:01:52 PM By: Levan Hurst RN, BSN Entered By: Levan Hurst on 06/23/2020 16:56:06 -------------------------------------------------------------------------------- Pain Assessment Details Patient Name: Date of Service: [name] 06/23/2020 3:30 PM Medical Record Number:  [id number] Patient Account Number: [id number] Date of Birth/Sex: Treating RN: 1966-04-04 (55 y.o. Tonita Phoenix, Lauren Primary Care Jawad Wiacek: Jenna Luo Other Clinician: Referring Zena Vitelli: Treating Gabby Rackers/Extender: Christella Hartigan in Treatment: 3 Active Problems Location of Pain Severity and Description of Pain Patient Has Paino Yes Site Locations Pain Location: Pain Location: Generalized Pain, Pain in Ulcers With Dressing Change: Yes Duration of the Pain. Constant / Intermittento Constant Rate the pain. Current Pain Level: 7 Worst Pain Level: 10 Least Pain Level: 0 Tolerable Pain Level: 7 Character of Pain Describe the Pain: Aching Pain Management and Medication Current Pain Management: Medication: Yes Cold Application: No Rest: Yes Massage: No Activity: No T.E.N.S.: No Heat Application: No Leg drop or elevation: No Is the Current Pain Management Adequate: Adequate How does your wound impact your activities of daily livingo Sleep: No Bathing: No Appetite: No Relationship With Others: No Bladder Continence: No Emotions: No Bowel Continence: No Work: No Toileting: No Drive: No Dressing: No Hobbies: No Electronic Signature(s) Signed: 06/23/2020 5:34:21 PM By: Rhae Hammock RN Entered By: Rhae Hammock on 06/23/2020 16:00:38 -------------------------------------------------------------------------------- Patient/Caregiver Education Details Patient Name: Date of Service: [name] 3/7/2022andnbsp3:30 PM Medical Record Number: [id number] Patient Account Number: [id number] Date of Birth/Gender: Treating RN: [birth year] (55 y.o. [name] Primary Care Physician: Jenna Luo Other Clinician: Referring Physician: Treating Physician/Extender: Christella Hartigan in Treatment: 3 Education Assessment Education Provided To: Patient Education Topics Provided Safety: Methods:  Explain/Verbal Responses: State content correctly Wound/Skin Impairment: Methods: Explain/Verbal Responses: State content correctly Electronic Signature(s) Signed: 06/24/2020 6:01:52 PM By: Levan Hurst RN, BSN Entered By: Levan Hurst on 06/23/2020 16:57:05 -------------------------------------------------------------------------------- Wound Assessment Details Patient Name: Date of Service: [name] 06/23/2020 3:30 PM Medical Record Number: [id number] Patient Account Number: [id number] Date of Birth/Sex: Treating RN: 12-12-1965 (55 y.o. 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, Plantar Amputation Site - Transmetatarsal Wound Open Wounding Event: Gradually Appeared Status: Date Acquired: 03/05/2020 Comorbid Asthma, Sleep Apnea, Hypertension, Osteoarthritis, Weeks Of Treatment: 3 History: Neuropathy, Seizure Disorder Clustered Wound: No Photos Wound Measurements Length: (cm) 0.6 Width: (cm) 0.6 Depth: (cm) 0.3 Area: (cm) 0.283 Volume: (cm) 0.085 % Reduction in Area: 63.9% % Reduction in Volume: 64% Epithelialization: Small (1-33%) Tunneling: No Undermining: No Wound Description Classification: Full Thickness Without Exposed Support Structures Wound Margin: Distinct, outline attached Exudate Amount: Medium Exudate Type: Serosanguineous Exudate Color: red, brown Foul Odor After Cleansing: No Slough/Fibrino No Wound Bed Granulation Amount: Large (  67-100%) Exposed Structure Granulation Quality: Red, Pink Fascia Exposed: No Necrotic Amount: None Present (0%) Fat Layer (Subcutaneous Tissue) Exposed: Yes Tendon Exposed: No Muscle Exposed: No Joint Exposed: No Bone Exposed: No Treatment Notes Wound #1 (Amputation  Site - Transmetatarsal) Wound Laterality: Plantar, Right Cleanser Soap and Water Discharge Instruction: May shower and wash wound with dial antibacterial soap and water prior to dressing change. Peri-Wound Care Topical Primary Dressing Hydrofera Blue Ready Foam, 2.5 x2.5 in Discharge Instruction: Apply to wound bed as instructed Secondary Dressing Woven Gauze Sponges 2x2 in Discharge Instruction: Apply over primary dressing as directed. Optifoam Non-Adhesive Dressing, 4x4 in Discharge Instruction: foam donut for offloading Secured With Kerlix Roll Sterile, 4.5x3.1 (in/yd) Discharge Instruction: Secure with Kerlix as directed. Paper Tape, 2x10 (in/yd) Discharge Instruction: Secure dressing with tape as directed. Compression Wrap Compression Stockings Add-Ons Electronic Signature(s) Signed: 06/24/2020 11:11:03 AM By: Sandre Kitty Signed: 06/25/2020 5:36:09 PM By: Rhae Hammock RN Previous Signature: 06/23/2020 5:34:21 PM Version By: Rhae Hammock RN Entered By: Sandre Kitty on 06/24/2020 11:10:21 -------------------------------------------------------------------------------- Vitals Details Patient Name: Date of Service: [name] 06/23/2020 3:30 PM Medical Record Number: [id number] Patient Account Number: [id number] Date of Birth/Sex: Treating RN: 1966/04/10 (55 y.o. Tonita Phoenix, Lauren Primary Care Timithy Arons: Jenna Luo Other Clinician: Referring Jevaughn Degollado: Treating Ninah Moccio/Extender: Christella Hartigan in Treatment: 3 Vital Signs Time Taken: 15:58 Temperature (F): 98.2 Height (in): 64 Pulse (bpm): 81 Weight (lbs): 228 Respiratory Rate (breaths/min): 17 Body Mass Index (BMI): 39.1 Blood Pressure (mmHg): 124/82 Reference Range: 80 - 120 mg / dl Electronic Signature(s) Signed: 06/23/2020 5:34:21 PM By: Rhae Hammock RN Entered By: Rhae Hammock on 06/23/2020 15:59:09

## 2020-06-27 ENCOUNTER — Other Ambulatory Visit: Payer: Self-pay

## 2020-06-27 ENCOUNTER — Encounter: Payer: Self-pay | Admitting: Family Medicine

## 2020-06-27 ENCOUNTER — Ambulatory Visit (INDEPENDENT_AMBULATORY_CARE_PROVIDER_SITE_OTHER): Payer: 59 | Admitting: Family Medicine

## 2020-06-27 VITALS — BP 120/66 | HR 94 | Temp 98.1°F | Resp 16 | Ht 64.0 in | Wt 238.0 lb

## 2020-06-27 DIAGNOSIS — Z01818 Encounter for other preprocedural examination: Secondary | ICD-10-CM | POA: Diagnosis not present
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
## 2020-06-28 LAB — CBC WITH DIFFERENTIAL/PLATELET
Absolute Monocytes: 389 cells/uL (ref 200–950)
Basophils Absolute: 12 cells/uL (ref 0–200)
Basophils Relative: 0.2 %
Eosinophils Absolute: 151 cells/uL (ref 15–500)
Eosinophils Relative: 2.6 %
HCT: 36 % (ref 35.0–45.0)
Hemoglobin: 12.2 g/dL (ref 11.7–15.5)
Lymphs Abs: 2274 cells/uL (ref 850–3900)
MCH: 32.4 pg (ref 27.0–33.0)
MCHC: 33.9 g/dL (ref 32.0–36.0)
MCV: 95.7 fL (ref 80.0–100.0)
MPV: 9.4 fL (ref 7.5–12.5)
Monocytes Relative: 6.7 %
Neutro Abs: 2975 cells/uL (ref 1500–7800)
Neutrophils Relative %: 51.3 %
Platelets: 278 10*3/uL (ref 140–400)
RBC: 3.76 10*6/uL — ABNORMAL LOW (ref 3.80–5.10)
RDW: 11.9 % (ref 11.0–15.0)
Total Lymphocyte: 39.2 %
WBC: 5.8 10*3/uL (ref 3.8–10.8)

## 2020-06-28 LAB — COMPLETE METABOLIC PANEL WITH GFR
AG Ratio: 1.4 (calc) (ref 1.0–2.5)
ALT: 14 U/L (ref 6–29)
AST: 15 U/L (ref 10–35)
Albumin: 3.8 g/dL (ref 3.6–5.1)
Alkaline phosphatase (APISO): 77 U/L (ref 37–153)
BUN: 16 mg/dL (ref 7–25)
CO2: 26 mmol/L (ref 20–32)
Calcium: 9.2 mg/dL (ref 8.6–10.4)
Chloride: 107 mmol/L (ref 98–110)
Creat: 0.59 mg/dL (ref 0.50–1.05)
GFR, Est African American: 119 mL/min/{1.73_m2} (ref >=60)
GFR, Est Non African American: 102 mL/min/{1.73_m2} (ref >=60)
Globulin: 2.7 g/dL (calc) (ref 1.9–3.7)
Glucose, Bld: 95 mg/dL (ref 65–99)
Potassium: 4.7 mmol/L (ref 3.5–5.3)
Sodium: 139 mmol/L (ref 135–146)
Total Bilirubin: 0.2 mg/dL (ref 0.2–1.2)
Total Protein: 6.5 g/dL (ref 6.1–8.1)

## 2020-07-01 ENCOUNTER — Other Ambulatory Visit: Payer: Self-pay

## 2020-07-01 ENCOUNTER — Encounter: Payer: Self-pay | Admitting: *Deleted

## 2020-07-01 ENCOUNTER — Encounter (HOSPITAL_BASED_OUTPATIENT_CLINIC_OR_DEPARTMENT_OTHER): Payer: 59 | Admitting: Internal Medicine

## 2020-07-01 DIAGNOSIS — L97512 Non-pressure chronic ulcer of other part of right foot with fat layer exposed: Secondary | ICD-10-CM | POA: Diagnosis not present

## 2020-07-02 NOTE — Progress Notes (Signed)
ROSSELYN, [name] ([id number]) Visit Report for 07/01/2020 Debridement Details Patient Name: Date of Service: [name] [name], [name] [name] 07/01/2020 2:30 PM Medical Record Number: [id number] Patient Account Number: [id number] Date of Birth/Sex: Treating RN: July 23, 1965 (55 y.o. Tonita Phoenix, Lauren Primary Care Provider: Jenna Luo Other Clinician: Referring Provider: Treating Provider/Extender: Rosezena Sensor in Treatment: 4 Debridement Performed for Assessment: Wound #1 Right,Plantar Amputation Site - Transmetatarsal Performed By: Physician Ricard Dillon., MD Debridement Type: Debridement Level of Consciousness (Pre-procedure): Awake and Alert Pre-procedure Verification/Time Out Yes - 16:05 Taken: Start Time: 16:05 Pain Control: Lidocaine T Area Debrided (L x W): otal 0.5 (cm) x 0.6 (cm) = 0.3 (cm) Tissue and other material debrided: Viable, Slough, Subcutaneous, Slough Level: Skin/Subcutaneous Tissue Debridement Description: Excisional Instrument: Curette Bleeding: Minimum Hemostasis Achieved: Pressure End Time: 16:06 Procedural Pain: 0 Post Procedural Pain: 0 Response to Treatment: Procedure was tolerated well Level of Consciousness (Post- Awake and Alert procedure): Post Debridement Measurements of Total Wound Length: (cm) 0.5 Width: (cm) 0.6 Depth: (cm) 0.2 Volume: (cm) 0.047 Character of Wound/Ulcer Post Debridement: Improved Post Procedure Diagnosis Same as Pre-procedure Electronic Signature(s) Signed: 07/01/2020 5:48:16 PM By: Linton Ham MD Signed: 07/02/2020 5:17:38 PM By: Rhae Hammock RN Entered By: Linton Ham on 07/01/2020 16:33:47 -------------------------------------------------------------------------------- HPI Details Patient Name: Date of Service: [name] Shutter D. 07/01/2020 2:30 PM Medical Record Number: [id number] Patient Account Number: [id number] Date of Birth/Sex: Treating RN: 06/15/1965 (55 y.o. Tonita Phoenix,  Lauren Primary Care Provider: Jenna Luo Other Clinician: Referring Provider: Treating Provider/Extender: Rosezena Sensor in Treatment: 4 History of Present Illness HPI Description: ADMISSION 06/02/2020 This is a 55 year old woman who has had problems with wounds on her predominantly plantar right foot for quite a period of time. 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 More recently she has had an area developed in the right mid foot at roughly the third metatarsal head. She says this started as a blister of November. Its not been closing. More recently she has been using Silvadene cream she has a cam boot to offload. I note she walks with a cane The patient has had arterial studies done in 2019 at which time her ABI in the right was 1.28 with triphasic waveforms noncompressible on the left with triphasic waveform. Her ABI in our clinic today was 1.3 on the right. 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 She had a foot gastrocnemius recession sometime in the fall 2021 2/21; patient arrives today with a wound measuring slightly smaller 3 x 3 mm however there is circumferential area of erythema around this. She does not have any complaints 2/28; plantar wound on the right first transmetatarsal amputation site. Small oval-shaped wound raised skin around the edges. We have been using Hydrofera Blue. She does not have arterial issues 06/23/2020 upon evaluation today patient appears to be doing decently well. With regard to her wound. There is little bit of slough and some hyper granulation. I do not feel like the PolyMem did too well for her. In fact she may do better with some Hydrofera Blue. I know we just switch from that but I feel like that  may be a better way to continue currently. 3/15; this is a patient with a wound on the  plantar right transmetatarsal amputation site probably the second metatarsal head not 1 July as stated previously we have been using polymen switch to [facility] last week. Wound looks about the same to me. We are going to start her on a total contact cast today we have the boot to fit Electronic Signature(s) Signed: 07/01/2020 5:48:16 PM By: Linton Ham MD Entered By: Linton Ham on 07/01/2020 16:34:51 -------------------------------------------------------------------------------- Physical Exam Details Patient Name: Date of Service: [name] Shutter D. 07/01/2020 2:30 PM Medical Record Number: [id number] Patient Account Number: [id number] Date of Birth/Sex: Treating RN: September 21, 1965 (55 y.o. [name] [name] Primary Care Provider: Jenna Luo Other Clinician: Referring Provider: Treating Provider/Extender: Rosezena Sensor in Treatment: 4 Cardiovascular Dorsalis pedis pulses palpable. Notes Wound exam; granulation does not look too bad under illumination. She does have raised thick edges around the wound I used a #3 curette on these removing skin and some subcutaneous tissue to try to get a better circumference for the wound. There is no evidence of infection Electronic Signature(s) Signed: 07/01/2020 5:48:16 PM By: Linton Ham MD Entered By: Linton Ham on 07/01/2020 16:35:39 -------------------------------------------------------------------------------- Physician Orders Details Patient Name: Date of Service: [name] Shutter D. 07/01/2020 2:30 PM Medical Record Number: [id number] Patient Account Number: [id number] Date of Birth/Sex: Treating RN: [birth year] (55 y.o. [name] [name] Primary Care Provider: Jenna Luo Other Clinician: Referring Provider: Treating Provider/Extender: Rosezena Sensor in Treatment: 4 Verbal /  Phone Orders: No Diagnosis Coding Follow-up Appointments ppointment in 1 week. - next Friday Return A ppointment in: - on Friday for 1st return TCC appt. Return A Bathing/ Shower/ Hygiene May shower with protection but do not get wound dressing(s) wet. May shower and wash wound with soap and water. - on days that dressing is changed Off-Loading Total Contact Cast to Right Lower Extremity Removable cast walker boot to: - right foot Wound Treatment Wound #1 - Amputation Site - Transmetatarsal Wound Laterality: Plantar, Right Cleanser: Soap and Water Every Other Day/15 Days Discharge Instructions: May shower and wash wound with dial antibacterial soap and water prior to dressing change. Prim Dressing: Hydrofera Blue Ready Foam, 2.5 x2.5 in Every Other Day/15 Days ary Discharge Instructions: Apply to wound bed as instructed Secondary Dressing: Woven Gauze Sponges 2x2 in (Generic) Every Other Day/15 Days Discharge Instructions: Apply over primary dressing as directed. Secondary Dressing: Optifoam Non-Adhesive Dressing, 4x4 in (Generic) Every Other Day/15 Days Discharge Instructions: foam donut for offloading Secured With: Kerlix Roll Sterile, 4.5x3.1 (in/yd) (Generic) Every Other Day/15 Days Discharge Instructions: Secure with Kerlix as directed. Secured With: Paper Tape, 2x10 (in/yd) (Generic) Every Other Day/15 Days Discharge Instructions: Secure dressing with tape as directed. Electronic Signature(s) Signed: 07/01/2020 5:48:16 PM By: Linton Ham MD Signed: 07/02/2020 5:17:38 PM By: Rhae Hammock RN Entered By: Rhae Hammock on 07/01/2020 16:09:20 -------------------------------------------------------------------------------- Problem List Details Patient Name: Date of Service: [name] Shutter D. 07/01/2020 2:30 PM Medical Record Number: [id number] Patient Account Number: [id number] Date of Birth/Sex: Treating RN: 1965-06-15 (55 y.o. Tonita Phoenix, Lauren Primary Care  Provider: Jenna Luo Other Clinician: Referring Provider: Treating Provider/Extender: Rosezena Sensor in Treatment: 4 Active Problems ICD-10 Encounter Code Description Active Date MDM Diagnosis [phone] Non-pressure chronic ulcer of other part of right foot with fat layer exposed 06/02/2020 No Yes G90.09 Other idiopathic peripheral autonomic neuropathy 06/02/2020 No Yes Inactive Problems ICD-10 Code Description Active Date Inactive Date L03.115 Cellulitis of right lower limb 06/09/2020 06/09/2020 Resolved Problems Electronic Signature(s) Signed: 07/01/2020 5:48:16 PM By:  Linton Ham MD Entered By: Linton Ham on 07/01/2020 16:33:23 -------------------------------------------------------------------------------- Progress Note Details Patient Name: Date of Service: ADRYANNA, FRIEDT 07/01/2020 2:30 PM Medical Record Number: [id number] Patient Account Number: [id number] Date of Birth/Sex: Treating RN: [birth year] (55 y.o. Tonita Phoenix, Lauren Primary Care Provider: Jenna Luo Other Clinician: Referring Provider: Treating Provider/Extender: Rosezena Sensor in Treatment: 4 Subjective History of Present Illness (HPI) ADMISSION 06/02/2020 This is a 55 year old woman who has had problems with wounds on her predominantly plantar right foot for quite a period of time. 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 More recently she has had an area developed in the right mid foot at roughly the third metatarsal head. She says this started as a blister of November. Its not been closing. More recently she has been using Silvadene cream she has a cam boot to offload. I note she walks with a cane The patient has had arterial studies done in 2019 at which time her ABI in the right was 1.28 with triphasic waveforms noncompressible on the  left with triphasic waveform. Her ABI in our clinic today was 1.3 on the right. 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 She had a foot gastrocnemius recession sometime in the fall 2021 2/21; patient arrives today with a wound measuring slightly smaller 3 x 3 mm however there is circumferential area of erythema around this. She does not have any complaints 2/28; plantar wound on the right first transmetatarsal amputation site. Small oval-shaped wound raised skin around the edges. We have been using Hydrofera Blue. She does not have arterial issues 06/23/2020 upon evaluation today patient appears to be doing decently well. With regard to her wound. There is little bit of slough and some hyper granulation. I do not feel like the PolyMem did too well for her. In fact she may do better with some Hydrofera Blue. I know we just switch from that but I feel like that may be a better way to continue currently. 3/15; this is a patient with a wound on the plantar right transmetatarsal amputation site probably the second metatarsal head not 1 July as stated previously we have been using polymen switch to [facility] last week. Wound looks about the same to me. We are going to start her on a total contact cast today we have the boot to fit Objective Constitutional Vitals Time Taken: 2:55 PM, Height: 64 in, Weight: 228 lbs, BMI: 39.1, Temperature: 97.8 F, Pulse: 91 bpm, Respiratory Rate: 17 breaths/min, Blood Pressure: 135/86 mmHg. Cardiovascular Dorsalis pedis pulses palpable. General Notes: Wound exam; granulation does not look too bad under illumination. She does have raised thick edges around the wound I used a #3 curette on these removing skin and some subcutaneous tissue to try to get a better circumference for the wound. There is no evidence of infection Integumentary (Hair,  Skin) Wound #1 status is Open. Original cause of wound was Gradually Appeared. The date acquired was: 03/05/2020. The wound has been in treatment 4 weeks. The wound is located on the Right,Plantar Amputation Site - Transmetatarsal. The wound measures 0.5cm length x 0.6cm width x 0.2cm depth; 0.236cm^2 area and 0.047cm^3 volume. There is Fat Layer (Subcutaneous Tissue) exposed. There is no tunneling or undermining noted. There  is a medium amount of serosanguineous drainage noted. The wound margin is distinct with the outline attached to the wound base. There is large (67-100%) pink granulation within the wound bed. There is no necrotic tissue within the wound bed. Assessment Active Problems ICD-10 Non-pressure chronic ulcer of other part of right foot with fat layer exposed Other idiopathic peripheral autonomic neuropathy Procedures Wound #1 Pre-procedure diagnosis of Wound #1 is a Neuropathic Ulcer-Non Diabetic located on the Right,Plantar Amputation Site - Transmetatarsal . There was a Excisional Skin/Subcutaneous Tissue Debridement with a total area of 0.3 sq cm performed by Ricard Dillon., MD. With the following instrument(s): Curette to remove Viable tissue/material. Material removed includes Subcutaneous Tissue and Slough and after achieving pain control using Lidocaine. No specimens were taken. A time out was conducted at 16:05, prior to the start of the procedure. A Minimum amount of bleeding was controlled with Pressure. The procedure was tolerated well with a pain level of 0 throughout and a pain level of 0 following the procedure. Post Debridement Measurements: 0.5cm length x 0.6cm width x 0.2cm depth; 0.047cm^3 volume. Character of Wound/Ulcer Post Debridement is improved. Post procedure Diagnosis Wound #1: Same as Pre-Procedure Pre-procedure diagnosis of Wound #1 is a Neuropathic Ulcer-Non Diabetic located on the Right,Plantar Amputation Site - Transmetatarsal . There was a  Total Contact Cast Procedure by Ricard Dillon., MD. Post procedure Diagnosis Wound #1: Same as Pre-Procedure Plan Follow-up Appointments: Return Appointment in 1 week. - next Friday Return Appointment in: - on Friday for 1st return TCC appt. Bathing/ Shower/ Hygiene: May shower with protection but do not get wound dressing(s) wet. May shower and wash wound with soap and water. - on days that dressing is changed Off-Loading: T Contact Cast to Right Lower Extremity otal Removable cast walker boot to: - right foot WOUND #1: - Amputation Site - Transmetatarsal Wound Laterality: Plantar, Right Cleanser: Soap and Water Every Other Day/15 Days Discharge Instructions: May shower and wash wound with dial antibacterial soap and water prior to dressing change. Prim Dressing: Hydrofera Blue Ready Foam, 2.5 x2.5 in Every Other Day/15 Days ary Discharge Instructions: Apply to wound bed as instructed Secondary Dressing: Woven Gauze Sponges 2x2 in (Generic) Every Other Day/15 Days Discharge Instructions: Apply over primary dressing as directed. Secondary Dressing: Optifoam Non-Adhesive Dressing, 4x4 in (Generic) Every Other Day/15 Days Discharge Instructions: foam donut for offloading Secured With: Kerlix Roll Sterile, 4.5x3.1 (in/yd) (Generic) Every Other Day/15 Days Discharge Instructions: Secure with Kerlix as directed. Secured With: Paper T ape, 2x10 (in/yd) (Generic) Every Other Day/15 Days Discharge Instructions: Secure dressing with tape as directed. 1. Continue with the Hydrofera Blue ready 2. First total contact cast today Electronic Signature(s) Signed: 07/01/2020 5:48:16 PM By: Linton Ham MD Entered By: Linton Ham on 07/01/2020 16:36:18 -------------------------------------------------------------------------------- Total Contact Cast Details Patient Name: Date of Service: [name] Shutter D. 07/01/2020 2:30 PM Medical Record Number: [id number] Patient Account Number:  [id number] Date of Birth/Sex: Treating RN: [birth year] (55 y.o. Tonita Phoenix, Lauren Primary Care Provider: Jenna Luo Other Clinician: Referring Provider: Treating Provider/Extender: Rosezena Sensor in Treatment: 4 T Contact Cast Applied for Wound Assessment: otal Wound #1 Right,Plantar Amputation Site - Transmetatarsal Performed By: Physician Ricard Dillon., MD Post Procedure Diagnosis Same as Pre-procedure Electronic Signature(s) Signed: 07/01/2020 5:48:16 PM By: Linton Ham MD Signed: 07/02/2020 5:17:38 PM By: Rhae Hammock RN Entered By: Rhae Hammock on 07/01/2020 16:10:09 -------------------------------------------------------------------------------- SuperBill Details Patient Name: Date of Service: [name] Shutter D. 07/01/2020 Medical Record  Number: [id number] Patient Account Number: [id number] Date of Birth/Sex: Treating RN: 12/20/1965 (55 y.o. Tonita Phoenix, Lauren Primary Care Provider: Jenna Luo Other Clinician: Referring Provider: Treating Provider/Extender: Rosezena Sensor in Treatment: 4 Diagnosis Coding ICD-10 Codes Code Description [phone] Non-pressure chronic ulcer of other part of right foot with fat layer exposed G90.09 Other idiopathic peripheral autonomic neuropathy Facility Procedures CPT4 Code: [id number] Description: 51834 - DEB SUBQ TISSUE 20 SQ CM/< ICD-10 Diagnosis Description L97.512 Non-pressure chronic ulcer of other part of right foot with fat layer exposed Modifier: Quantity: 1 Physician Procedures : CPT4 Code Description Modifier [id number] - WC PHYS LEVEL 3 - EST PT 25 ICD-10 Diagnosis Description L97.512 Non-pressure chronic ulcer of other part of right foot with fat layer exposed G90.09 Other idiopathic peripheral autonomic neuropathy Quantity: 1 : [id number] - WC PHYS SUBQ TISS 20 SQ CM ICD-10 Diagnosis Description L97.512 Non-pressure chronic ulcer of other part of  right foot with fat layer exposed Quantity: 1 Electronic Signature(s) Signed: 07/01/2020 5:48:16 PM By: Linton Ham MD Entered By: Linton Ham on 07/01/2020 16:36:53

## 2020-07-03 NOTE — Progress Notes (Signed)
[name] [name], [name] [name] ([id number]) Visit Report for 07/01/2020 Arrival Information Details Patient Name: Date of Service: [name] [name], [name] [name] 07/01/2020 2:30 PM Medical Record Number: [id number] Patient Account Number: [id number] Date of Birth/Sex: Treating RN: 12-24-1965 (54 y.o. Tonita Phoenix, Lauren Primary Care Simar Pothier: Jenna Luo Other Clinician: Referring Ree Alcalde: Treating Rikayla Demmon/Extender: Rosezena Sensor in Treatment: 4 Visit Information History Since Last Visit Added or deleted any medications: No Patient Arrived: [name] [name] Any new allergies or adverse reactions: No Arrival Time: 14:55 Had a fall or experienced change in No Accompanied By: self activities of daily living that may affect Transfer Assistance: None risk of falls: Patient Identification Verified: Yes Signs or symptoms of abuse/neglect since last visito No Secondary Verification Process Completed: Yes Hospitalized since last visit: No Patient Requires Transmission-Based Precautions: No Implantable device outside of the clinic excluding No Patient Has Alerts: No cellular tissue based products placed in the center since last visit: Has Dressing in Place as Prescribed: Yes Pain Present Now: No Electronic Signature(s) Signed: 07/01/2020 3:44:20 PM By: Sandre Kitty Entered By: Sandre Kitty on 07/01/2020 14:55:30 -------------------------------------------------------------------------------- Encounter Discharge Information Details Patient Name: Date of Service: [name] Shutter D. 07/01/2020 2:30 PM Medical Record Number: [id number] Patient Account Number: [id number] Date of Birth/Sex: Treating RN: 1966/04/08 (55 y.o. Tonita Phoenix, Lauren Primary Care Silvana Holecek: Jenna Luo Other Clinician: Referring Aden Sek: Treating Alayasia Breeding/Extender: Rosezena Sensor in Treatment: 4 Encounter Discharge Information Items Post Procedure Vitals Discharge Condition:  Stable Temperature (F): 97.8 Ambulatory Status: Cane Pulse (bpm): 91 Discharge Destination: Home Respiratory Rate (breaths/min): 17 Transportation: Private Auto Blood Pressure (mmHg): 135/86 Schedule Follow-up Appointment: Yes Clinical Summary of Care: Provided on 07/01/2020 Form Type Recipient Paper Patient Patient Electronic Signature(s) Signed: 07/01/2020 4:37:02 PM By: Lorrin Jackson Entered By: Lorrin Jackson on 07/01/2020 16:37:02 -------------------------------------------------------------------------------- Lower Extremity Assessment Details Patient Name: Date of Service: [name] Shutter D. 07/01/2020 2:30 PM Medical Record Number: [id number] Patient Account Number: [id number] Date of Birth/Sex: Treating RN: 1965/12/02 (55 y.o. [name] Primary Care Antanisha Mohs: Jenna Luo Other Clinician: Referring Kaaliyah Kita: Treating Darrah Dredge/Extender: Rosezena Sensor in Treatment: 4 Edema Assessment Assessed: Shirlyn Goltz: No] Patrice Paradise: No] Edema: [Left: N] [Right: o] Calf Left: Right: Point of Measurement: 34 cm From Medial Instep 36 cm Ankle Left: Right: Point of Measurement: 9 cm From Medial Instep 23.5 cm Vascular Assessment Pulses: Dorsalis Pedis Palpable: [Right:Yes] Electronic Signature(s) Signed: 07/03/2020 5:54:24 PM By: Levan Hurst RN, BSN Entered By: Levan Hurst on 07/01/2020 14:59:08 -------------------------------------------------------------------------------- Multi Wound Chart Details Patient Name: Date of Service: [name] Shutter D. 07/01/2020 2:30 PM Medical Record Number: [id number] Patient Account Number: [id number] Date of Birth/Sex: Treating RN: May 16, 1965 (55 y.o. 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] [N/A:N/A] Primary Etiology: [1:Asthma, Sleep Apnea, Hypertension,] [N/A:N/A] Comorbid History: [1:Osteoarthritis, Neuropathy, Seizure Disorder 03/05/2020] [N/A:N/A] Date Acquired: [1:4] [N/A:N/A] Weeks of Treatment: [1:Open] [N/A:N/A] Wound Status: [1:0.5x0.6x0.2] [N/A:N/A] Measurements L x W x D (cm) [1:0.236] [N/A:N/A] A (cm) : rea [1:0.047] [N/A:N/A] Volume (cm) : [1:69.90%] [N/A:N/A] % Reduction in Area: [1:80.10%] [N/A:N/A] % Reduction in Volume: [1:Full Thickness Without Exposed] [N/A:N/A] Classification: [1:Support Structures Medium] [N/A:N/A] Exudate A mount: [1:Serosanguineous] [N/A:N/A] Exudate Type: [1:red, brown] [N/A:N/A]  Exudate Color: [1:Distinct, outline attached] [N/A:N/A] Wound Margin: [1:Large (67-100%)] [N/A:N/A] Granulation A mount: [1:Pink] [N/A:N/A] Granulation Quality: [1:None Present (0%)] [N/A:N/A] Necrotic A mount: [1:Fat Layer (Subcutaneous Tissue): Yes N/A] Exposed Structures: [1:Fascia: No Tendon: No Muscle: No Joint: No Bone: No Small (1-33%)] [N/A:N/A] Epithelialization: [1:Debridement - Excisional] [N/A:N/A] Debridement: Pre-procedure Verification/Time Out 16:05 [N/A:N/A] Taken: [1:Lidocaine] [N/A:N/A] Pain Control: [1:Subcutaneous, Slough] [N/A:N/A] Tissue Debrided: [1:Skin/Subcutaneous Tissue] [N/A:N/A] Level: [1:0.3] [N/A:N/A] Debridement A (sq cm): [1:rea Curette] [N/A:N/A] Instrument: [1:Minimum] [N/A:N/A] Bleeding: [1:Pressure] [N/A:N/A] Hemostasis A chieved: [1:0] [N/A:N/A] Procedural Pain: [1:0] [N/A:N/A] Post Procedural Pain: [1:Procedure was tolerated well] [N/A:N/A] Debridement Treatment Response: [1:0.5x0.6x0.2] [N/A:N/A] Post Debridement  Measurements L x W x D (cm) [1:0.047] [N/A:N/A] Post Debridement Volume: (cm) [1:Debridement] [N/A:N/A] Procedures Performed: [1:T Contact Cast otal] Treatment Notes Electronic Signature(s) Signed: 07/01/2020 5:48:16 PM By: Linton Ham MD Signed: 07/02/2020 5:17:38 PM By: Rhae Hammock RN Entered By: Linton Ham on 07/01/2020 16:33:29 -------------------------------------------------------------------------------- Multi-Disciplinary Care Plan Details Patient Name: Date of Service: [name] Shutter D. 07/01/2020 2:30 PM Medical Record Number: [id number] Patient Account Number: [id number] Date of Birth/Sex: Treating RN: 11/28/1965 (55 y.o. Tonita Phoenix, Lauren Primary Care Merric Yost: Jenna Luo Other Clinician: Referring Yelena Metzer: Treating Crislyn Willbanks/Extender: Rosezena Sensor in Treatment: 4 Multidisciplinary Care Plan reviewed with physician Active Inactive Abuse / Safety / Falls / Self Care Management Nursing Diagnoses: History of Falls Potential for injury related to falls Goals: Patient will not experience any injury related to falls Date Initiated: 06/02/2020 Target Resolution Date: 07/04/2020 Goal Status: Active Patient/caregiver will verbalize/demonstrate measures taken to prevent injury and/or falls Date Initiated: 06/02/2020 Target Resolution Date: 07/04/2020 Goal Status: Active Interventions: Assess Activities of Daily Living upon admission and as needed Assess fall risk on admission and as needed Assess: immobility, friction, shearing, incontinence upon admission and as needed Assess impairment of mobility on admission and as needed per policy Assess personal safety and home safety (as indicated) on admission and as needed Assess self care needs on admission and as needed Provide education on personal and home safety Notes: Wound/Skin Impairment Nursing Diagnoses: Impaired tissue integrity Knowledge deficit related to  ulceration/compromised skin integrity Goals: Patient/caregiver will verbalize understanding of skin care regimen Date Initiated: 06/02/2020 Target Resolution Date: 07/04/2020 Goal Status: Active Ulcer/skin breakdown will have a volume reduction of 30% by week 4 Date Initiated: 06/02/2020 Target Resolution Date: 07/04/2020 Goal Status: Active Interventions: Assess patient/caregiver ability to obtain necessary supplies Assess patient/caregiver ability to perform ulcer/skin care regimen upon admission and as needed Assess ulceration(s) every visit Provide education on ulcer and skin care Notes: Electronic Signature(s) Signed: 07/02/2020 5:17:38 PM By: Rhae Hammock RN Entered By: Rhae Hammock on 07/01/2020 16:09:34 -------------------------------------------------------------------------------- Pain Assessment Details Patient Name: Date of Service: [name] Shutter D. 07/01/2020 2:30 PM Medical Record Number: [id number] Patient Account Number: [id number] Date of Birth/Sex: Treating RN: 1966/01/18 (55 y.o. Tonita Phoenix, Lauren Primary Care Ferlin Fairhurst: Jenna Luo Other Clinician: Referring Kyndel Egger: Treating Kathryn Cosby/Extender: Rosezena Sensor in Treatment: 4 Active Problems Location of Pain Severity and Description of Pain Patient Has Paino No Site Locations Pain Management and Medication Current Pain Management: Electronic Signature(s) Signed: 07/01/2020 3:44:20 PM By: Sandre Kitty Signed: 07/02/2020 5:17:38 PM By: Rhae Hammock RN Entered By: Sandre Kitty on 07/01/2020 14:56:04 -------------------------------------------------------------------------------- Patient/Caregiver Education Details Patient Name: Date of Service: [name] [name] 3/15/2022andnbsp2:30 PM Medical Record Number: [id number] Patient Account Number: [id number] Date of Birth/Gender: Treating RN: 12-27-1965 (55 y.o. Benjaman Lobe Primary Care Physician:  Jenna Luo Other Clinician: Referring Physician:  Treating Physician/Extender: Rosezena Sensor in Treatment: 4 Education Assessment Education Provided To: Patient Education Topics Provided Safety: Methods: Explain/Verbal Responses: State content correctly Electronic Signature(s) Signed: 07/02/2020 5:17:38 PM By: Rhae Hammock RN Entered By: Rhae Hammock on 07/01/2020 16:09:47 -------------------------------------------------------------------------------- Wound Assessment Details Patient Name: Date of Service: [name] Shutter D. 07/01/2020 2:30 PM Medical Record Number: [id number] Patient Account Number: [id number] Date of Birth/Sex: Treating RN: 26-Jul-1965 (55 y.o. 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, Plantar Amputation Site - Transmetatarsal Wound Open Wounding Event: Gradually Appeared Status: Date Acquired: 03/05/2020 Comorbid Asthma, Sleep Apnea, Hypertension, Osteoarthritis, Weeks Of Treatment: 4 History: Neuropathy, Seizure Disorder Clustered Wound: No Photos Wound Measurements Length: (cm) 0.5 Width: (cm) 0.6 Depth: (cm) 0.2 Area: (cm) 0.236 Volume: (cm) 0.047 % Reduction in Area: 69.9% % Reduction in Volume: 80.1% Epithelialization: Small (1-33%) Tunneling: No Undermining: No Wound Description Classification: Full Thickness Without Exposed Support Structures Wound Margin: Distinct, outline attached Exudate Amount: Medium Exudate Type: Serosanguineous Exudate Color: red, brown Foul Odor After Cleansing: No Slough/Fibrino No Wound Bed Granulation Amount: Large (67-100%) Exposed Structure Granulation Quality: Pink Fascia Exposed: No Necrotic Amount: None Present (0%) Fat  Layer (Subcutaneous Tissue) Exposed: Yes Tendon Exposed: No Muscle Exposed: No Joint Exposed: No Bone Exposed: No Treatment Notes Wound #1 (Amputation Site - Transmetatarsal) Wound Laterality: Plantar, Right Cleanser Soap and Water Discharge Instruction: May shower and wash wound with dial antibacterial soap and water prior to dressing change. Peri-Wound Care Topical Primary Dressing Hydrofera Blue Ready Foam, 2.5 x2.5 in Discharge Instruction: Apply to wound bed as instructed Secondary Dressing Woven Gauze Sponges 2x2 in Discharge Instruction: Apply over primary dressing as directed. Optifoam Non-Adhesive Dressing, 4x4 in Discharge Instruction: foam donut for offloading Secured With Kerlix Roll Sterile, 4.5x3.1 (in/yd) Discharge Instruction: Secure with Kerlix as directed. Paper Tape, 2x10 (in/yd) Discharge Instruction: Secure dressing with tape as directed. Compression Wrap Compression Stockings Add-Ons Electronic Signature(s) Signed: 07/01/2020 5:27:17 PM By: Sandre Kitty Signed: 07/02/2020 5:17:38 PM By: Rhae Hammock RN Entered By: Sandre Kitty on 07/01/2020 17:02:56 -------------------------------------------------------------------------------- Vitals Details Patient Name: Date of Service: [name] Shutter D. 07/01/2020 2:30 PM Medical Record Number: [id number] Patient Account Number: [id number] Date of Birth/Sex: Treating RN: 03-Aug-1965 (54 y.o. Tonita Phoenix, Lauren Primary Care Pride Gonzales: Jenna Luo Other Clinician: Referring Maurica Omura: Treating Maciah Feeback/Extender: Rosezena Sensor in Treatment: 4 Vital Signs Time Taken: 14:55 Temperature (F): 97.8 Height (in): 64 Pulse (bpm): 91 Weight (lbs): 228 Respiratory Rate (breaths/min): 17 Body Mass Index (BMI): 39.1 Blood Pressure (mmHg): 135/86 Reference Range: 80 - 120 mg / dl Electronic Signature(s) Signed: 07/01/2020 3:44:20 PM By: Sandre Kitty Entered By: Sandre Kitty on 07/01/2020 14:55:57

## 2020-07-04 ENCOUNTER — Other Ambulatory Visit: Payer: Self-pay

## 2020-07-04 ENCOUNTER — Encounter (HOSPITAL_BASED_OUTPATIENT_CLINIC_OR_DEPARTMENT_OTHER): Payer: 59 | Admitting: Internal Medicine

## 2020-07-04 DIAGNOSIS — L97512 Non-pressure chronic ulcer of other part of right foot with fat layer exposed: Secondary | ICD-10-CM | POA: Diagnosis not present

## 2020-07-04 NOTE — Progress Notes (Signed)
[name], [name] ([id number]) Visit Report for 07/04/2020 HPI Details Patient Name: Date of Service: [name] [name], [name] [name] 07/04/2020 8:15 A M Medical Record Number: [id number] Patient Account Number: [id number] Date of Birth/Sex: Treating RN: Sep 13, 1965 (55 y.o. [name] Primary Care Provider: Jenna Luo Other Clinician: Referring Provider: Treating Provider/Extender: Rosezena Sensor in Treatment: 4 History of Present Illness HPI Description: ADMISSION 06/02/2020 This is a 55 year old woman who has had problems with wounds on her predominantly plantar right foot for quite a period of time. 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 More recently she has had an area developed in the right mid foot at roughly the third metatarsal head. She says this started as a blister of November. Its not been closing. More recently she has been using Silvadene cream she has a cam boot to offload. I note she walks with a cane The patient has had arterial studies done in 2019 at which time her ABI in the right was 1.28 with triphasic waveforms noncompressible on the left with triphasic waveform. Her ABI in our clinic today was 1.3 on the right. 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 She had a foot gastrocnemius recession sometime in the fall 2021 2/21; patient arrives today with a wound measuring slightly smaller 3 x 3 mm however there is circumferential area of erythema around this. She does not have any complaints 2/28; plantar wound on the right first transmetatarsal amputation site. Small oval-shaped wound raised skin around the edges. We have been using Hydrofera Blue. She does not have arterial issues 06/23/2020 upon evaluation today patient  appears to be doing decently well. With regard to her wound. There is little bit of slough and some hyper granulation. I do not feel like the PolyMem did too well for her. In fact she may do better with some Hydrofera Blue. I know we just switch from that but I feel like that may be a better way to continue currently. 3/15; this is a patient with a wound on the plantar right transmetatarsal amputation site probably the second metatarsal head not 1 July as stated previously we have been using polymen switch to [facility] last week. Wound looks about the same to me. We are going to start her on a total contact cast today we have the boot to fit 3/18; first total contact cast change she tolerated this well Electronic Signature(s) Signed: 07/04/2020 5:26:06 PM By: Linton Ham MD Entered By: Linton Ham on 07/04/2020 09:41:29 -------------------------------------------------------------------------------- Physical Exam Details Patient Name: Date of Service: [name] Shutter D. 07/04/2020 8:15 A M Medical Record Number: [id number] Patient Account Number: [id number] Date of Birth/Sex: Treating RN: [birth year] (55 y.o. [name] Primary Care Provider: Jenna Luo Other Clinician: Referring Provider: Treating Provider/Extender: Rosezena Sensor in Treatment: 4 Notes Wound exam; the granulation looks healthy. There is some erythema but this looks more like irritation than cellulitis it was that way 3 days ago as well I do not see any expansion Electronic Signature(s) Signed: 07/04/2020 5:26:06 PM By: Linton Ham MD Entered By: Linton Ham on 07/04/2020 09:43:14 -------------------------------------------------------------------------------- Physician Orders Details Patient Name: Date of Service: [name] Shutter D. 07/04/2020 8:15 A M Medical Record Number: [id number] Patient Account Number: [id number] Date of  Birth/Sex: Treating RN: 06/04/1965 (55  y.o. [name] Primary Care Provider: Jenna Luo Other Clinician: Referring Provider: Treating Provider/Extender: Rosezena Sensor in Treatment: 4 Verbal / Phone Orders: No Diagnosis Coding Follow-up Appointments ppointment in 1 week. - Friday Return A Bathing/ Shower/ Hygiene May shower with protection but do not get wound dressing(s) wet. May shower and wash wound with soap and water. - on days that dressing is changed Off-Loading Total Contact Cast to Right Lower Extremity Removable cast walker boot to: - right foot Wound Treatment Wound #1 - Amputation Site - Transmetatarsal Wound Laterality: Plantar, Right Cleanser: Soap and Water Every Other Day/15 Days Discharge Instructions: May shower and wash wound with dial antibacterial soap and water prior to dressing change. Prim Dressing: Hydrofera Blue Ready Foam, 2.5 x2.5 in Every Other Day/15 Days ary Discharge Instructions: Apply to wound bed as instructed Secondary Dressing: Woven Gauze Sponges 2x2 in (Generic) Every Other Day/15 Days Discharge Instructions: Apply over primary dressing as directed. Secondary Dressing: Optifoam Non-Adhesive Dressing, 4x4 in (Generic) Every Other Day/15 Days Discharge Instructions: foam donut for offloading Secured With: Kerlix Roll Sterile, 4.5x3.1 (in/yd) (Generic) Every Other Day/15 Days Discharge Instructions: Secure with Kerlix as directed. Secured With: Paper Tape, 2x10 (in/yd) (Generic) Every Other Day/15 Days Discharge Instructions: Secure dressing with tape as directed. Electronic Signature(s) Signed: 07/04/2020 5:26:06 PM By: Linton Ham MD Signed: 07/04/2020 5:48:33 PM By: Lorrin Jackson Previous Signature: 07/04/2020 8:39:38 AM Version By: Lorrin Jackson Entered By: Lorrin Jackson on 07/04/2020 08:46:49 -------------------------------------------------------------------------------- Problem List Details Patient Name: Date of Service: [name] Shutter D. 07/04/2020 8:15 A M Medical Record Number: [id number] Patient Account Number: [id number] Date of Birth/Sex: Treating RN: 1965-10-22 (55 y.o. [name] Primary Care Provider: Jenna Luo Other Clinician: Referring Provider: Treating Provider/Extender: Rosezena Sensor in Treatment: 4 Active Problems ICD-10 Encounter Code Description Active Date MDM Diagnosis L97.512 Non-pressure chronic ulcer of other part of right foot with fat layer exposed 06/02/2020 No Yes G90.09 Other idiopathic peripheral autonomic neuropathy 06/02/2020 No Yes Inactive Problems ICD-10 Code Description Active Date Inactive Date L03.115 Cellulitis of right lower limb 06/09/2020 06/09/2020 Resolved Problems Electronic Signature(s) Signed: 07/04/2020 5:26:06 PM By: Linton Ham MD Entered By: Linton Ham on 07/04/2020 09:13:36 -------------------------------------------------------------------------------- Progress Note Details Patient Name: Date of Service: [name] Shutter D. 07/04/2020 8:15 A M Medical Record Number: [id number] Patient Account Number: [id number] Date of Birth/Sex: Treating RN: [birth year] (55 y.o. [name] Primary Care Provider: Jenna Luo Other Clinician: Referring Provider: Treating Provider/Extender: Rosezena Sensor in Treatment: 4 Subjective History of Present Illness (HPI) ADMISSION 06/02/2020 This is a 55 year old woman who has had problems with wounds on her predominantly plantar right foot for quite a period of time. 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 More recently she has had an area developed in the right mid foot at roughly the third metatarsal head. She says this started as a blister of November. Its not been closing. More recently she has been using Silvadene cream she has a cam boot  to offload. I note she walks with a cane The patient has had arterial studies done in 2019 at which time her ABI in the right was 1.28 with triphasic waveforms noncompressible on the left with triphasic waveform. Her ABI in our clinic today was 1.3 on the right.  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 She had a foot gastrocnemius recession sometime in the fall 2021 2/21; patient arrives today with a wound measuring slightly smaller 3 x 3 mm however there is circumferential area of erythema around this. She does not have any complaints 2/28; plantar wound on the right first transmetatarsal amputation site. Small oval-shaped wound raised skin around the edges. We have been using Hydrofera Blue. She does not have arterial issues 06/23/2020 upon evaluation today patient appears to be doing decently well. With regard to her wound. There is little bit of slough and some hyper granulation. I do not feel like the PolyMem did too well for her. In fact she may do better with some Hydrofera Blue. I know we just switch from that but I feel like that may be a better way to continue currently. 3/15; this is a patient with a wound on the plantar right transmetatarsal amputation site probably the second metatarsal head not 1 July as stated previously we have been using polymen switch to [facility] last week. Wound looks about the same to me. We are going to start her on a total contact cast today we have the boot to fit 3/18; first total contact cast change she tolerated this well Objective Constitutional Vitals Time Taken: 8:17 AM, Height: 64 in, Weight: 228 lbs, BMI: 39.1, Temperature: 98.4 F, Pulse: 80 bpm, Respiratory Rate: 18 breaths/min, Blood Pressure: 144/89 mmHg. Integumentary (Hair, Skin) Wound #1 status is Open. Original cause of wound was Gradually Appeared. The date acquired  was: 03/05/2020. The wound has been in treatment 4 weeks. The wound is located on the Right,Plantar Amputation Site - Transmetatarsal. The wound measures 0.8cm length x 0.8cm width x 0.1cm depth; 0.503cm^2 area and 0.05cm^3 volume. There is Fat Layer (Subcutaneous Tissue) exposed. There is no tunneling or undermining noted. There is a medium amount of serosanguineous drainage noted. The wound margin is distinct with the outline attached to the wound base. There is large (67-100%) pink granulation within the wound bed. There is no necrotic tissue within the wound bed. Assessment Active Problems ICD-10 Non-pressure chronic ulcer of other part of right foot with fat layer exposed Other idiopathic peripheral autonomic neuropathy Procedures Wound #1 Pre-procedure diagnosis of Wound #1 is a Neuropathic Ulcer-Non Diabetic located on the Right,Plantar Amputation Site - Transmetatarsal . There was a Total Contact Cast Procedure by Ricard Dillon., MD. Post procedure Diagnosis Wound #1: Same as Pre-Procedure Plan Follow-up Appointments: Return Appointment in 1 week. - Friday Bathing/ Shower/ Hygiene: May shower with protection but do not get wound dressing(s) wet. May shower and wash wound with soap and water. - on days that dressing is changed Off-Loading: T Contact Cast to Right Lower Extremity otal Removable cast walker boot to: - right foot WOUND #1: - Amputation Site - Transmetatarsal Wound Laterality: Plantar, Right Cleanser: Soap and Water Every Other Day/15 Days Discharge Instructions: May shower and wash wound with dial antibacterial soap and water prior to dressing change. Prim Dressing: Hydrofera Blue Ready Foam, 2.5 x2.5 in Every Other Day/15 Days ary Discharge Instructions: Apply to wound bed as instructed Secondary Dressing: Woven Gauze Sponges 2x2 in (Generic) Every Other Day/15 Days Discharge Instructions: Apply over primary dressing as directed. Secondary Dressing: Optifoam  Non-Adhesive Dressing, 4x4 in (Generic) Every Other Day/15 Days Discharge Instructions: foam donut for offloading Secured With: Kerlix Roll Sterile, 4.5x3.1 (in/yd) (Generic)  Every Other Day/15 Days Discharge Instructions: Secure with Kerlix as directed. Secured With: Paper T ape, 2x10 (in/yd) (Generic) Every Other Day/15 Days Discharge Instructions: Secure dressing with tape as directed. 1. We have reapplied Hydrofera Blue and put her back in a total contact cast follow-up in 1 Electronic Signature(s) Signed: 07/04/2020 5:26:06 PM By: Linton Ham MD Entered By: Linton Ham on 07/04/2020 09:43:36 -------------------------------------------------------------------------------- Total Contact Cast Details Patient Name: Date of Service: [name] Shutter D. 07/04/2020 8:15 A M Medical Record Number: [id number] Patient Account Number: [id number] Date of Birth/Sex: Treating RN: 10-12-1965 (54 y.o. [name] Primary Care Provider: Jenna Luo Other Clinician: Referring Provider: Treating Provider/Extender: Rosezena Sensor in Treatment: 4 T Contact Cast Applied for Wound Assessment: otal Wound #1 Right,Plantar Amputation Site - Transmetatarsal Performed By: Physician Ricard Dillon., MD Post Procedure Diagnosis Same as Pre-procedure Electronic Signature(s) Signed: 07/04/2020 5:26:06 PM By: Linton Ham MD Previous Signature: 07/04/2020 8:40:14 AM Version By: Lorrin Jackson Entered By: Linton Ham on 07/04/2020 09:14:00 -------------------------------------------------------------------------------- SuperBill Details Patient Name: Date of Service: [name] 07/04/2020 Medical Record Number: [id number] Patient Account Number: [id number] Date of Birth/Sex: Treating RN: [birth year] (55 y.o. [name] Primary Care Provider: Jenna Luo Other Clinician: Referring Provider: Treating Provider/Extender: Rosezena Sensor in Treatment: 4 Diagnosis Coding ICD-10 Codes Code Description [phone] Non-pressure chronic ulcer of other part of right foot with fat layer exposed G90.09 Other idiopathic peripheral autonomic neuropathy Facility Procedures CPT4 Code: [id number] Description: [phone] - APPLY TOTAL CONTACT LEG CAST ICD-10 Diagnosis Description L97.512 Non-pressure chronic ulcer of other part of right foot with fat layer exposed Modifier: Quantity: 1 Physician Procedures : CPT4 Code Description Modifier [id number] - WC PHYS APPLY TOTAL CONTACT CAST ICD-10 Diagnosis Description L97.512 Non-pressure chronic ulcer of other part of right foot with fat layer exposed Quantity: 1 Electronic Signature(s) Signed: 07/04/2020 5:26:06 PM By: Linton Ham MD Previous Signature: 07/04/2020 8:40:58 AM Version By: Lorrin Jackson Entered By: Linton Ham on 07/04/2020 09:43:55

## 2020-07-04 NOTE — Progress Notes (Addendum)
[name] [name], [name] [name] ([id number]) Visit Report for 07/04/2020 Arrival Information Details Patient Name: Date of Service: [name] [name], [name] [name] 07/04/2020 8:15 A M Medical Record Number: [id number] Patient Account Number: [id number] Date of Birth/Sex: Treating RN: 1965/09/29 (55 y.o. [name] Primary Care Darlys Buis: Jenna Luo Other Clinician: Referring Yaiden Yang: Treating Camaryn Lumbert/Extender: Rosezena Sensor in Treatment: 4 Visit Information History Since Last Visit Added or deleted any medications: No Patient Arrived: [name] [name] Any new allergies or adverse reactions: No Arrival Time: 08:16 Had a fall or experienced change in No Accompanied By: self activities of daily living that may affect Transfer Assistance: None risk of falls: Patient Identification Verified: Yes Signs or symptoms of abuse/neglect since last visito No Secondary Verification Process Completed: Yes Hospitalized since last visit: No Patient Requires Transmission-Based Precautions: No Implantable device outside of the clinic excluding No Patient Has Alerts: No cellular tissue based products placed in the center since last visit: Has Dressing in Place as Prescribed: Yes Has Footwear/Offloading in Place as Prescribed: Yes Right: T Contact Cast otal Pain Present Now: No Electronic Signature(s) Signed: 07/04/2020 5:28:19 PM By: Deon Pilling Entered By: Deon Pilling on 07/04/2020 08:28:29 -------------------------------------------------------------------------------- Encounter Discharge Information Details Patient Name: Date of Service: [name] Shutter D. 07/04/2020 8:15 A M Medical Record Number: [id number] Patient Account Number: [id number] Date of Birth/Sex: Treating RN: 11/24/1965 (55 y.o. [name] Primary Care Daviana Haymaker: Jenna Luo Other Clinician: Referring Ann-Marie Kluge: Treating Analaura Messler/Extender: Rosezena Sensor in Treatment: 4 Encounter Discharge  Information Items Discharge Condition: Stable Ambulatory Status: Ambulatory Discharge Destination: Home Transportation: Private Auto Accompanied By: self Schedule Follow-up Appointment: Yes Clinical Summary of Care: Patient Declined Electronic Signature(s) Signed: 07/04/2020 5:12:39 PM By: Baruch Gouty RN, BSN Entered By: Baruch Gouty on 07/04/2020 09:06:13 -------------------------------------------------------------------------------- Lower Extremity Assessment Details Patient Name: Date of Service: [name] Shutter D. 07/04/2020 8:15 A M Medical Record Number: [id number] Patient Account Number: [id number] Date of Birth/Sex: Treating RN: 08-14-1965 (55 y.o. [name] Primary Care Zamorah Ailes: Jenna Luo Other Clinician: Referring Yaire Kreher: Treating Domenique Quest/Extender: Rosezena Sensor in Treatment: 4 Edema Assessment Assessed: Shirlyn Goltz: No] Patrice Paradise: Yes] Edema: [Left: N] [Right: o] Calf Left: Right: Point of Measurement: 34 cm From Medial Instep 39 cm Ankle Left: Right: Point of Measurement: 9 cm From Medial Instep 25.5 cm Vascular Assessment Pulses: Dorsalis Pedis Palpable: [Right:Yes] Electronic Signature(s) Signed: 07/04/2020 5:28:19 PM By: Deon Pilling Entered By: Deon Pilling on 07/04/2020 08:29:11 -------------------------------------------------------------------------------- Multi Wound Chart Details Patient Name: Date of Service: [name] Shutter D. 07/04/2020 8:15 A M Medical Record Number: [id number] Patient Account Number: [id number] Date of Birth/Sex: Treating RN: [birth year] (55 y.o. 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] [N/A:N/A] Primary Etiology: [1:Asthma, Sleep Apnea, Hypertension,] [N/A:N/A] Comorbid History: [1:Osteoarthritis, Neuropathy, Seizure Disorder 03/05/2020] [N/A:N/A] Date Acquired: [1:4] [N/A:N/A] Weeks of Treatment: [1:Open] [N/A:N/A] Wound Status: [1:0.8x0.8x0.1] [N/A:N/A] Measurements L x W x D (cm) [1:0.503] [N/A:N/A] A (cm) : rea [1:0.05] [N/A:N/A] Volume (cm) : [1:35.90%] [N/A:N/A] % Reduction in Area: [1:78.80%] [N/A:N/A] % Reduction in Volume: [1:Full Thickness Without Exposed] [N/A:N/A] Classification: [1:Support Structures Medium] [N/A:N/A] Exudate Amount: [1:Serosanguineous] [N/A:N/A] Exudate Type: [1:red, brown] [N/A:N/A] Exudate Color: [1:Distinct, outline attached] [N/A:N/A]  Wound Margin: [1:Large (67-100%)] [N/A:N/A] Granulation Amount: [1:Pink] [N/A:N/A] Granulation Quality: [1:None Present (0%)] [N/A:N/A] Necrotic Amount: [1:Fat Layer (Subcutaneous Tissue): Yes N/A] Exposed Structures: [1:Fascia: No Tendon: No Muscle: No Joint: No Bone: No Small (1-33%)] [N/A:N/A] Epithelialization: [1:T Contact Cast otal] [N/A:N/A] Treatment Notes Wound #1 (Amputation Site - Transmetatarsal) Wound Laterality: Plantar, Right Cleanser Soap and Water Discharge Instruction: May shower and wash wound with dial antibacterial soap and water prior to dressing change. Peri-Wound Care Topical Primary Dressing Hydrofera Blue Ready Foam, 2.5 x2.5 in Discharge Instruction: Apply to wound bed as instructed Secondary Dressing Woven Gauze Sponges 2x2 in Discharge Instruction: Apply over primary dressing as directed. Optifoam Non-Adhesive Dressing, 4x4 in Discharge Instruction: foam donut for offloading Secured With Kerlix Roll Sterile, 4.5x3.1 (in/yd) Discharge Instruction: Secure  with Kerlix as directed. Paper Tape, 2x10 (in/yd) Discharge Instruction: Secure dressing with tape as directed. Compression Wrap Compression Stockings Add-Ons Electronic Signature(s) Signed: 07/04/2020 5:12:39 PM By: Baruch Gouty RN, BSN Signed: 07/04/2020 5:26:06 PM By: Linton Ham MD Entered By: Linton Ham on 07/04/2020 09:13:50 -------------------------------------------------------------------------------- Multi-Disciplinary Care Plan Details Patient Name: Date of Service: [name] Shutter D. 07/04/2020 8:15 A M Medical Record Number: [id number] Patient Account Number: [id number] Date of Birth/Sex: Treating RN: 09/23/1965 (55 y.o. [name] Primary Care Shristi Scheib: Jenna Luo Other Clinician: Referring Xylan Sheils: Treating Jahsir Rama/Extender: Rosezena Sensor in Treatment: [place] reviewed with physician Active Inactive Wound/Skin Impairment Nursing Diagnoses: Impaired tissue integrity Knowledge deficit related to ulceration/compromised skin integrity Goals: Patient/caregiver will verbalize understanding of skin care regimen Date Initiated: 06/02/2020 Target Resolution Date: 07/04/2020 Goal Status: Active Ulcer/skin breakdown will have a volume reduction of 30% by week 4 Date Initiated: 06/02/2020 Target Resolution Date: 07/04/2020 Goal Status: Active Interventions: Assess patient/caregiver ability to obtain necessary supplies Assess patient/caregiver ability to perform ulcer/skin care regimen upon admission and as needed Assess ulceration(s) every visit Provide education on ulcer and skin care Notes: Electronic Signature(s) Signed: 07/04/2020 8:22:59 AM By: Lorrin Jackson Signed: 07/04/2020 5:12:39 PM By: Baruch Gouty RN, BSN Entered By: Lorrin Jackson on 07/04/2020 08:22:59 -------------------------------------------------------------------------------- Pain Assessment Details Patient Name: Date of  Service: [name] Shutter D. 07/04/2020 8:15 A M Medical Record Number: [id number] Patient Account Number: [id number] Date of Birth/Sex: Treating RN: [birth year] (55 y.o. [name] Primary Care Kimberlyn Quiocho: Jenna Luo Other Clinician: Referring Brin Ruggerio: Treating Clarrisa Kaylor/Extender: Rosezena Sensor in Treatment: 4 Active Problems Location of Pain Severity and Description of Pain Patient Has Paino No Site Locations Rate the pain. Current Pain Level: 0 Pain Management and Medication Current Pain Management: Medication: No Cold Application: No Rest: No Massage: No Activity: No T.E.N.S.: No Heat Application: No Leg drop or elevation: No Is the Current Pain Management Adequate: Adequate How does your wound impact your activities of daily livingo Sleep: No Bathing: No Appetite: No Relationship With Others: No Bladder Continence: No Emotions: No Bowel Continence: No Work: No Toileting: No Drive: No Dressing: No Hobbies: No Electronic Signature(s) Signed: 07/04/2020 5:28:19 PM By: Deon Pilling Entered By: Deon Pilling on 07/04/2020 08:28:53 -------------------------------------------------------------------------------- Patient/Caregiver Education Details Patient Name: Date of Service: [name] [name] 3/18/2022andnbsp8:15 A M Medical Record Number: [id number] Patient Account Number: [id number] Date of Birth/Gender: Treating RN: 02/02/1966 (55 y.o. [name] Primary Care Physician: Jenna Luo Other Clinician: Referring Physician: Treating Physician/Extender: Rosezena Sensor in Treatment: 4 Education Assessment Education Provided To: Patient Education Topics Provided Offloading: Methods: Explain/Verbal, Printed Responses: State content correctly Wound/Skin Impairment: Methods: Demonstration, Explain/Verbal, Printed  Responses: State content correctly Electronic Signature(s) Signed: 07/04/2020  5:48:33 PM By: Lorrin Jackson Entered By: Lorrin Jackson on 07/04/2020 08:40:32 -------------------------------------------------------------------------------- Wound Assessment Details Patient Name: Date of Service: [name] Shutter D. 07/04/2020 8:15 A M Medical Record Number: [id number] Patient Account Number: [id number] Date of Birth/Sex: Treating RN: 27-Nov-1965 (55 y.o. 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, Plantar Amputation Site - Transmetatarsal Wound Open Wounding Event: Gradually Appeared Status: Date Acquired: 03/05/2020 Comorbid Asthma, Sleep Apnea, Hypertension, Osteoarthritis, Weeks Of Treatment: 4 History: Neuropathy, Seizure Disorder Clustered Wound: No Photos Wound Measurements Length: (cm) 0.8 Width: (cm) 0.8 Depth: (cm) 0.1 Area: (cm) 0.503 Volume: (cm) 0.05 % Reduction in Area: 35.9% % Reduction in Volume: 78.8% Epithelialization: Small (1-33%) Tunneling: No Undermining: No Wound Description Classification: Full Thickness Without Exposed Support Structures Wound Margin: Distinct, outline attached Exudate Amount: Medium Exudate Type: Serosanguineous Exudate Color: red, brown Foul Odor After Cleansing: No Slough/Fibrino No Wound Bed Granulation Amount: Large (67-100%) Exposed Structure Granulation Quality: Pink Fascia Exposed: No Necrotic Amount: None Present (0%) Fat Layer (Subcutaneous Tissue) Exposed: Yes Tendon Exposed: No Muscle Exposed: No Joint Exposed: No Bone Exposed: No Treatment Notes Wound #1 (Amputation Site - Transmetatarsal) Wound Laterality: Plantar, Right Cleanser Soap and Water Discharge Instruction: May shower and wash wound with dial antibacterial soap and water prior to dressing  change. Peri-Wound Care Topical Primary Dressing Hydrofera Blue Ready Foam, 2.5 x2.5 in Discharge Instruction: Apply to wound bed as instructed Secondary Dressing Woven Gauze Sponges 2x2 in Discharge Instruction: Apply over primary dressing as directed. Optifoam Non-Adhesive Dressing, 4x4 in Discharge Instruction: foam donut for offloading Secured With Kerlix Roll Sterile, 4.5x3.1 (in/yd) Discharge Instruction: Secure with Kerlix as directed. Paper Tape, 2x10 (in/yd) Discharge Instruction: Secure dressing with tape as directed. Compression Wrap Compression Stockings Add-Ons Electronic Signature(s) Signed: 07/07/2020 7:47:40 AM By: Sandre Kitty Signed: 07/07/2020 4:58:53 PM By: Deon Pilling Previous Signature: 07/04/2020 5:28:19 PM Version By: Deon Pilling Entered By: Sandre Kitty on 07/06/2020 12:38:27 -------------------------------------------------------------------------------- Vitals Details Patient Name: Date of Service: [name] Shutter D. 07/04/2020 8:15 A M Medical Record Number: [id number] Patient Account Number: [id number] Date of Birth/Sex: Treating RN: [birth year] (55 y.o. Helene Shoe, Tammi Klippel Primary Care Jsiah Menta: Jenna Luo Other Clinician: Referring Shakema Surita: Treating Ave Scharnhorst/Extender: Rosezena Sensor in Treatment: 4 Vital Signs Time Taken: 08:17 Temperature (F): 98.4 Height (in): 64 Pulse (bpm): 80 Weight (lbs): 228 Respiratory Rate (breaths/min): 18 Body Mass Index (BMI): 39.1 Blood Pressure (mmHg): 144/89 Reference Range: 80 - 120 mg / dl Electronic Signature(s) Signed: 07/04/2020 5:28:19 PM By: Deon Pilling Entered By: Deon Pilling on 07/04/2020 81:27:51

## 2020-07-11 ENCOUNTER — Encounter (HOSPITAL_BASED_OUTPATIENT_CLINIC_OR_DEPARTMENT_OTHER): Payer: 59 | Admitting: Internal Medicine

## 2020-07-11 ENCOUNTER — Telehealth (INDEPENDENT_AMBULATORY_CARE_PROVIDER_SITE_OTHER): Payer: 59 | Admitting: Nurse Practitioner

## 2020-07-11 DIAGNOSIS — J069 Acute upper respiratory infection, unspecified: Secondary | ICD-10-CM | POA: Diagnosis not present

## 2020-07-11 DIAGNOSIS — J4521 Mild intermittent asthma with (acute) exacerbation: Secondary | ICD-10-CM

## 2020-07-11 MED ORDER — ALBUTEROL SULFATE HFA 108 (90 BASE) MCG/ACT IN AERS: INHALATION_SPRAY | RESPIRATORY_TRACT | 3 refills | Status: DC

## 2020-07-11 MED ORDER — PREDNISONE 10 MG PO TABS: 10.0000 mg | ORAL_TABLET | Freq: Every day | ORAL | 0 refills | Status: DC

## 2020-07-11 NOTE — Patient Instructions (Signed)
Upper Respiratory Infection, Adult An upper respiratory infection (URI) affects the nose, throat, and upper air passages. URIs are caused by germs (viruses). The most common type of URI is often called "the common cold." Medicines cannot cure URIs, but you can do things at home to relieve your symptoms. URIs usually get better within 7-10 days. Follow these instructions at home: Activity  Rest as needed.  If you have a fever, stay home from work or school until your fever is gone, or until your doctor says you may return to work or school. ? You should stay home until you cannot spread the infection anymore (you are not contagious). ? Your doctor may have you wear a face mask so you have less risk of spreading the infection. Relieving symptoms  Gargle with a salt-water mixture 3-4 times a day or as needed. To make a salt-water mixture, completely dissolve -1 tsp of salt in 1 cup of warm water.  Use a cool-mist humidifier to add moisture to the air. This can help you breathe more easily. Eating and drinking  Drink enough fluid to keep your pee (urine) pale yellow.  Eat soups and other clear broths.   General instructions  Take over-the-counter and prescription medicines only as told by your doctor. These include cold medicines, fever reducers, and cough suppressants.  Do not use any products that contain nicotine or tobacco. These include cigarettes and e-cigarettes. If you need help quitting, ask your doctor.  Avoid being where people are smoking (avoid secondhand smoke).  Make sure you get regular shots and get the flu shot every year.  Keep all follow-up visits as told by your doctor. This is important.   How to avoid spreading infection to others  Wash your hands often with soap and water. If you do not have soap and water, use hand sanitizer.  Avoid touching your mouth, face, eyes, or nose.  Cough or sneeze into a tissue or your sleeve or elbow. Do not cough or sneeze into  your hand or into the air.   Contact a doctor if:  You are getting worse, not better.  You have any of these: ? A fever. ? Chills. ? Brown or red mucus in your nose. ? Yellow or brown fluid (discharge)coming from your nose. ? Pain in your face, especially when you bend forward. ? Swollen neck glands. ? Pain with swallowing. ? White areas in the back of your throat. Get help right away if:  You have shortness of breath that gets worse.  You have very bad or constant: ? Headache. ? Ear pain. ? Pain in your forehead, behind your eyes, and over your cheekbones (sinus pain). ? Chest pain.  You have long-lasting (chronic) lung disease along with any of these: ? Wheezing. ? Long-lasting cough. ? Coughing up blood. ? A change in your usual mucus.  You have a stiff neck.  You have changes in your: ? Vision. ? Hearing. ? Thinking. ? Mood. Summary  An upper respiratory infection (URI) is caused by a germ called a virus. The most common type of URI is often called "the common cold."  URIs usually get better within 7-10 days.  Take over-the-counter and prescription medicines only as told by your doctor. This information is not intended to replace advice given to you by your health care provider. Make sure you discuss any questions you have with your health care provider. Document Revised: 12/13/2019 Document Reviewed: 12/13/2019 [employer] Patient Education  2021 [employer]  

## 2020-07-11 NOTE — Progress Notes (Signed)
Subjective:    Patient ID: [name] [name], female    DOB: November 05, 1965, 55 y.o.   MRN: [id number]  HPI: [name] [name] is a 55 y.o. female presenting for upper respiratory infection.  Chief Complaint  Patient presents with  . Cough    Productive cough with red mucusy phlegm, taking otc cough syrup. No covid vaccine, did get flu vaccine. Has temp of 99.4. sx began 2 days ago. Having pain in the back and ribs due to cough   UPPER RESPIRATORY TRACT INFECTION Onset: Wednesday 07/09/2020 COVID vaccine status: not vaccinated COVID testing history: tested negative at home Fever: yes; low grade 99.4 Cough: yes;  productive Shortness of breath: yes; with activity and with laying down Wheezing: no Chest pain: yes, with cough Chest tightness: no  Pain with inspiration: yes Chest congestion: yes; coughing up bright red mucus Nasal congestion: yes Runny nose: no Post nasal drip: no Sneezing: no Sore throat: yes Swollen glands: no Sinus pressure: no Headache: yes Face pain: no Toothache: yes; gums are hurting Ear pain: no  Ear pressure: no  Eyes red/itching:no Eye drainage/crusting: no  Nausea: no  Vomiting: no  Diarrhea: no  Change in appetite: yes; decreased  Loss of taste/smell: no  Rash: no Fatigue: yes Sick contacts: yes; mother - lives with her and tested negative for COVID Strep contacts: no  Context: stable Recurrent sinusitis: no Treatments attempted: OTC cough syrup guaifenesin (red), olive leaf and elder berry Relief with OTC medications: yes; during the day  Allergies  Allergen Reactions  . Other   . Penicillins Hives and Rash    No anaphylactic or severe cutaneous Sx > 10 yr ago, no additional medical attention required Tolerates Keflex and Rocephin  . Tetracyclines & Related Other (See Comments)    Syncope and put her "in a coma"  . Tramadol Other (See Comments)    Seizures  . Phenazopyridine Other (See Comments)    Unknown  . Tetracycline Other  (See Comments)    Syncope and "Put me in a coma"  . Ciprofloxacin Rash and Itching  . Codeine Itching and Rash  . Estradiol Rash    Patches broke out the skin    Outpatient Encounter Medications as of 07/11/2020  Medication Sig  . busPIRone (BUSPAR) 15 MG tablet Take 15 mg by mouth 3 (three) times daily.   . Calcium Carbonate-Vit D-Min (CALCIUM 1200 PO) Take 1,200 mg by mouth daily.   . cyclobenzaprine (FLEXERIL) 10 MG tablet Take 10 mg by mouth 2 (two) times daily as needed for muscle spasms.   . DULoxetine (CYMBALTA) 60 MG capsule TAKE 1 CAPSULE BY MOUTH EVERY DAY  . Erenumab-aooe (AIMOVIG) 70 MG/ML SOAJ Inject 70 mg into the skin every 30 (thirty) days.  Marland Kitchen escitalopram (LEXAPRO) 10 MG tablet Take 10 mg by mouth daily.  Marland Kitchen estradiol (ESTRACE) 1 MG tablet Take 1 tablet (1 mg total) by mouth at bedtime.  . fluticasone (FLONASE) 50 MCG/ACT nasal spray SPRAY 2 SPRAYS INTO EACH NOSTRIL EVERY DAY  . haloperidol (HALDOL) 5 MG tablet Take 1 tablet (5 mg total) by mouth at bedtime. For psychosis  . levocetirizine (XYZAL) 5 MG tablet TAKE 1 TABLET BY MOUTH EVERY DAY IN THE EVENING  . LINZESS 290 MCG CAPS capsule TAKE 1 CAPSULE BY MOUTH EVERY DAY IN THE MORNING BEFORE BREAKFAST  . meloxicam (MOBIC) 15 MG tablet Take 15 mg by mouth at bedtime.   . metoprolol succinate (TOPROL-XL) 25 MG 24 hr tablet  TAKE 1 TABLET(25 MG) BY MOUTH DAILY  . Multiple Vitamins-Minerals (MULTIVITAMIN WITH MINERALS) tablet Take 1 tablet by mouth daily.  Marland Kitchen NARCAN 4 MG/0.1ML LIQD nasal spray kit Place 1 spray into the nose once.   . nystatin (MYCOSTATIN/NYSTOP) powder APPLY TO AFFECTED AREA 4 TIMES A DAY  . OVER THE COUNTER MEDICATION Fennel Seed  . oxyCODONE-acetaminophen (PERCOCET) 7.5-325 MG tablet Take 1 tablet by mouth 5 (five) times daily as needed for severe pain.  . pantoprazole (PROTONIX) 40 MG tablet TAKE 1 TABLET BY MOUTH TWICE A DAY  . potassium chloride SA (KLOR-CON) 20 MEQ tablet TAKE 2 TABLET BY MOUTH EVERY DAY   . PROAIR HFA 108 (90 Base) MCG/ACT inhaler Inhale 2 puffs = into the lungs every 6 (six) hours as needed for wheezing or shortness of breath. (Patient taking differently: Inhale 2 puffs into the lungs every 6 (six) hours as needed for wheezing.)  . SUMAtriptan (IMITREX) 50 MG tablet TAKE 1 TAB EVERY 2 HOURS AS NEEDED FOR MIGRAINE. MAY REPEAT IN 2 HOURS IF PERSISTS OR RECURS  . topiramate (TOPAMAX) 50 MG tablet Take 1 tablet (50 mg total) by mouth 2 (two) times daily.  . traZODone (DESYREL) 100 MG tablet Take 100 mg by mouth at bedtime.  . pregabalin (LYRICA) 100 MG capsule Take 100 mg by mouth 2 (two) times daily.   No facility-administered encounter medications on file as of 07/11/2020.    Patient Active Problem List   Diagnosis Date Noted  . Syncope 08/26/2019  . Non-healing open wound of toe 05/15/2019  . Osteomyelitis ([name]) 05/15/2019  . Palpitations 10/26/2018  . Foot ulcer ([name]) 01/31/2018  . Hyperglycemia 01/31/2018  . Left leg cellulitis 09/24/2017  . Syncope and collapse   . Near syncope 11/23/2016  . Abdominal pain 09/23/2016  . Anxiety 09/23/2016  . 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[name]) 09/23/2016  . Constipation 09/23/2016  . DDD (degenerative disc disease), lumbosacral 09/23/2016  . Migraines 09/23/2016  . Myelopathy ([name]) 09/23/2016  . Nausea 09/23/2016  . Post laminectomy syndrome 09/23/2016  . Pseudoarthrosis of lumbar spine 09/23/2016  . Seizures ([name]) 09/23/2016  . Shortness of breath 09/23/2016  . Sleep apnea 09/23/2016  . Spinal stenosis of lumbar region 09/23/2016  . Vertigo 09/23/2016  . 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 Chronic pain syndrome   . Esophageal reflux   . Depression   . Rash 11/14/2015  . Toe ulcer, right ([name]) 04/11/2015  . Cellulitis of toe of right foot 04/11/2015  . Nonspecific chest pain   . Chest pain 10/26/2014  . Obesity (BMI 30-39.9) 10/26/2014  . Hypokalemia 10/26/2014  . Manic bipolar I disorder ([name]) 10/26/2014  .  Atypical angina ([name]) 10/26/2014  . Tardive dyskinesia 08/26/2011  . Manic bipolar I disorder with rapid cycling ([name]) 05/18/2011    Class: Acute  . Asthma 02/24/2011  . Essential hypertension 02/24/2011  . History of migraine headaches 02/24/2011    Past Medical History:  Diagnosis Date  . Anxiety   . Arthritis    [employer] 02/08/2020  . Asthma    mild intermittent  . Asthma    [employer] 02/08/2020  . Bipolar 1 disorder ([name])    ect treatments last treatment Sep 02 1011  . Depression   . Depression    [employer] 02/08/2020  . Depression    [employer] 07/10/2020  . GERD (gastroesophageal reflux disease)   . Hypertension   . Pre-diabetes   . Seizures ([name])    last seizure was so  long ago she can't remember.   . Sleep apnea    wears CPAP, uncertain of setting  . Substance abuse ([place])    [employer] 02/08/2020    Relevant past medical, surgical, family and social history reviewed and updated as indicated. Interim medical history since our last visit reviewed.  Review of Systems Per HPI unless specifically indicated above     Objective:    There were no vitals taken for this visit.  Wt Readings from Last 3 Encounters:  06/27/20 238 lb (108 kg)  02/08/20 240 lb (108.9 kg)  09/04/19 236 lb 12.8 oz (107.4 kg)    Physical Exam  Physical examination unable to be performed due to lack of equipment.  Patient talking in complete sentences during examination with occasional congested-sounding cough.     Assessment & Plan:  1. Upper respiratory tract infection, unspecified type Acute, ongoing x 2 days.  At-home COVID test was negative.  Encouraged to obtain send-out testing, however patient politely declines today.  Reassured patient that symptoms and exam findings are most consistent with a viral upper respiratory infection and explained lack of efficacy of antibiotics against viruses.  Discussed expected course and features suggestive of secondary bacterial infection.  Continue  supportive care. Increase fluid intake with water or electrolyte solution like pedialyte. Encouraged acetaminophen as needed for fever/pain. Encouraged salt water gargling, chloraseptic spray and throat lozenges. Encouraged OTC guaifenesin. Encouraged saline sinus flushes and/or neti with humidified air.  Instructed to follow up with [place] if symptoms do not gradually improve over the next 10 days or so.   2. Mild intermittent asthma with exacerbation Given pain with inspiration and history of asthma, concern for asthma exacerbated by acute URI.  Will start on prednisone taper and refill rescue inhaler.  Follow up if symptoms not improving.  With any sudden onset new chest pain, dizziness, sweating, or shortness of breath, go to ED.   - albuterol (PROAIR HFA) 108 (90 Base) MCG/ACT inhaler; Inhale 2 puffs = 180mg into the lungs every 6 (six) hours as needed for wheezing or shortness of breath.  Dispense: 8.5 g; Refill: 3 - predniSONE (DELTASONE) 10 MG tablet; Take 1 tablet (10 mg total) by mouth daily with breakfast. Take 435mon days 1-2. Take 3062mn days 3-4. Take 33m15m days 5-6. Take 10mg54mdays 7-8. Take 5mg o31mays 9-10, then stop.  Dispense: 21 tablet; Refill: 0   Follow up plan: No follow-ups on file.  This visit was completed via telephone due to the restrictions of the COVID-19 pandemic. All issues as above were discussed and addressed but no physical exam was performed. If it was felt that the patient should be evaluated in the office, they were directed there. The patient verbally consented to this visit. Patient was unable to complete an audio/visual visit due to Technical difficulties. . Location of the patient: home . Location of the provider: work . Those involved with this call:  . Provider: JessicNoemi Chapel FNP-C . CMA: AnitraAnnabelle Harman. Front Desk/Registration: NanettSantina Evansme spent on call: 12 minutes on the phone discussing health concerns. 15 minutes total  spent in review of patient's record and preparation of their chart.  I verified patient identity using two factors (patient name and date of birth). Patient consents verbally to being seen via telemedicine visit today.

## 2020-07-17 ENCOUNTER — Encounter (HOSPITAL_BASED_OUTPATIENT_CLINIC_OR_DEPARTMENT_OTHER): Payer: 59 | Admitting: Internal Medicine

## 2020-07-17 ENCOUNTER — Other Ambulatory Visit: Payer: Self-pay

## 2020-07-17 DIAGNOSIS — L97512 Non-pressure chronic ulcer of other part of right foot with fat layer exposed: Secondary | ICD-10-CM | POA: Diagnosis not present

## 2020-07-17 NOTE — Progress Notes (Signed)
[name] [name], [name] [name] ([id number]) Visit Report for 07/17/2020 Arrival Information Details Patient Name: Date of Service: [name] [name], [name] [name] 07/17/2020 2:30 PM Medical Record Number: [id number] Patient Account Number: [id number] Date of Birth/Sex: Treating RN: Mar 09, 1966 (55 y.o. [name] Primary Care Noble Bodie: Jenna Luo Other Clinician: Referring Denine Brotz: Treating Jermarcus Mcfadyen/Extender: Rosezena Sensor in Treatment: 6 Visit Information History Since Last Visit Added or deleted any medications: No Patient Arrived: [name] Any new allergies or adverse reactions: No Arrival Time: 14:25 Had a fall or experienced change in No Accompanied By: alone activities of daily living that may affect Transfer Assistance: None risk of falls: Patient Identification Verified: Yes Signs or symptoms of abuse/neglect since last visito No Secondary Verification Process Completed: Yes Hospitalized since last visit: No Patient Requires Transmission-Based Precautions: No Implantable device outside of the clinic excluding No Patient Has Alerts: No cellular tissue based products placed in the center since last visit: Has Dressing in Place as Prescribed: Yes Has Footwear/Offloading in Place as Prescribed: Yes Right: T Contact Cast otal Pain Present Now: No Electronic Signature(s) Signed: 07/17/2020 5:46:28 PM By: Levan Hurst RN, BSN Entered By: Levan Hurst on 07/17/2020 14:26:14 -------------------------------------------------------------------------------- Encounter Discharge Information Details Patient Name: Date of Service: [name] Shutter D. 07/17/2020 2:30 PM Medical Record Number: [id number] Patient Account Number: [id number] Date of Birth/Sex: Treating RN: 1965/07/18 (55 y.o. [name] Primary Care Yasmina Ch[place]: Jenna Luo Other Clinician: Referring Amilya Haver: Treating Candies Palm/Extender: Rosezena Sensor in Treatment: 6 Encounter  Discharge Information Items Post Procedure Vitals Discharge Condition: Stable Temperature (F): 98.1 Ambulatory Status: Cane Pulse (bpm): 78 Discharge Destination: Home Respiratory Rate (breaths/min): 16 Transportation: Private Auto Blood Pressure (mmHg): 123/84 Accompanied By: alone Schedule Follow-up Appointment: Yes Clinical Summary of Care: Patient Declined Electronic Signature(s) Signed: 07/17/2020 5:46:28 PM By: Levan Hurst RN, BSN Entered By: Levan Hurst on 07/17/2020 17:03:10 -------------------------------------------------------------------------------- Lower Extremity Assessment Details Patient Name: Date of Service: [name] Shutter D. 07/17/2020 2:30 PM Medical Record Number: [id number] Patient Account Number: [id number] Date of Birth/Sex: Treating RN: February 16, 1966 (55 y.o. [name] Primary Care Zully Frane: Jenna Luo Other Clinician: Referring Travis Purk: Treating Alliyah Roesler/Extender: Rosezena Sensor in Treatment: 6 Edema Assessment Assessed: Shirlyn Goltz: No] Patrice Paradise: No] Edema: [Left: N] [Right: o] Calf Left: Right: Point of Measurement: 34 cm From Medial Instep 35.6 cm Ankle Left: Right: Point of Measurement: 9 cm From Medial Instep 23 cm Vascular Assessment Pulses: Dorsalis Pedis Palpable: [Right:Yes] Electronic Signature(s) Signed: 07/17/2020 5:46:28 PM By: Levan Hurst RN, BSN Entered By: Levan Hurst on 07/17/2020 14:36:32 -------------------------------------------------------------------------------- Multi Wound Chart Details Patient Name: Date of Service: [name] Shutter D. 07/17/2020 2:30 PM Medical Record Number: [id number] Patient Account Number: [id number] Date of Birth/Sex: Treating RN: 04-24-1965 (55 y.o. Helene Shoe, Meta.Reding Primary Care Shilo Pauwels: Jenna Luo Other Clinician: Referring Ryan Palermo: Treating Jourdyn Ferrin/Extender: Rosezena Sensor in Treatment: 6 Vital Signs Height(in):  64 Pulse(bpm): 57 Weight(lbs): 228 Blood Pressure(mmHg): 123/84 Body Mass Index(BMI): 39 Temperature(F): 98.1 Respiratory Rate(breaths/min): 16 Photos: [1:No Photos Right, Plantar Amputation Site -] [N/A:N/A N/A] Wound Location: [1:Transmetatarsal Gradually Appeared] [N/A:N/A] Wounding Event: [1:Neuropathic Ulcer-Non Diabetic] [N/A:N/A] Primary Etiology: [1:Asthma, Sleep Apnea, Hypertension,] [N/A:N/A] Comorbid History: [1:Osteoarthritis, Neuropathy, Seizure Disorder 03/05/2020] [N/A:N/A] Date Acquired: [1:6] [N/A:N/A] Weeks of Treatment: [1:Open] [N/A:N/A] Wound Status: [1:0.8x0.6x0.1] [N/A:N/A] Measurements L x W x D (cm) [1:0.377] [N/A:N/A] A (cm) : rea [1:0.038] [N/A:N/A] Volume (cm) : [1:52.00%] [N/A:N/A] % Reduction in Area: [1:83.90%] [N/A:N/A] % Reduction in Volume: [1:Full Thickness Without Exposed] [N/A:N/A] Classification: [1:Support Structures  Medium] [N/A:N/A] Exudate A mount: [1:Serosanguineous] [N/A:N/A] Exudate Type: [1:red, brown] [N/A:N/A] Exudate Color: [1:Distinct, outline attached] [N/A:N/A] Wound Margin: [1:Large (67-100%)] [N/A:N/A] Granulation A mount: [1:Pink, Hyper-granulation] [N/A:N/A] Granulation Quality: [1:None Present (0%)] [N/A:N/A] Necrotic A mount: [1:Fat Layer (Subcutaneous Tissue): Yes N/A] Exposed Structures: [1:Fascia: No Tendon: No Muscle: No Joint: No Bone: No Small (1-33%)] [N/A:N/A] Epithelialization: [1:Debridement - Excisional] [N/A:N/A] Debridement: Pre-procedure Verification/Time Out 15:00 [N/A:N/A] Taken: [1:Lidocaine 4% T opical Solution] [N/A:N/A] Pain Control: [1:Subcutaneous] [N/A:N/A] Tissue Debrided: [1:Skin/Subcutaneous Tissue] [N/A:N/A] Level: [1:0.48] [N/A:N/A] Debridement A (sq cm): [1:rea Curette] [N/A:N/A] Instrument: [1:Minimum] [N/A:N/A] Bleeding: [1:Silver Nitrate] [N/A:N/A] Hemostasis A chieved: [1:0] [N/A:N/A] Procedural Pain: [1:0] [N/A:N/A] Post Procedural Pain: [1:Procedure was tolerated well]  [N/A:N/A] Debridement Treatment Response: [1:0.8x0.6x0.1] [N/A:N/A] Post Debridement Measurements L x W x D (cm) [1:0.038] [N/A:N/A] Post Debridement Volume: (cm) [1:Debridement] [N/A:N/A] Procedures Performed: [1:T Contact Cast otal] Treatment Notes Electronic Signature(s) Signed: 07/17/2020 5:43:02 PM By: Linton Ham MD Signed: 07/17/2020 5:47:39 PM By: Deon Pilling Entered By: Linton Ham on 07/17/2020 15:42:52 -------------------------------------------------------------------------------- Multi-Disciplinary Care Plan Details Patient Name: Date of Service: [name] Shutter D. 07/17/2020 2:30 PM Medical Record Number: [id number] Patient Account Number: [id number] Date of Birth/Sex: Treating RN: Jan 30, 1966 (56 y.o. [name] Primary Care Bennett Ram: Jenna Luo Other Clinician: Referring Miaa Latterell: Treating Siobhan Zaro/Extender: Rosezena Sensor in Treatment: [place] reviewed with physician Active Inactive Wound/Skin Impairment Nursing Diagnoses: Impaired tissue integrity Knowledge deficit related to ulceration/compromised skin integrity Goals: Patient/caregiver will verbalize understanding of skin care regimen Date Initiated: 06/02/2020 Target Resolution Date: 08/08/2020 Goal Status: Active Ulcer/skin breakdown will have a volume reduction of 30% by week 4 Date Initiated: 06/02/2020 Date Inactivated: 07/17/2020 Target Resolution Date: 07/04/2020 Unmet Reason: no major change see Goal Status: Unmet measurements. Interventions: Assess patient/caregiver ability to obtain necessary supplies Assess patient/caregiver ability to perform ulcer/skin care regimen upon admission and as needed Assess ulceration(s) every visit Provide education on ulcer and skin care Notes: Electronic Signature(s) Signed: 07/17/2020 5:47:39 PM By: Deon Pilling Entered By: Deon Pilling on 07/17/2020  15:07:28 -------------------------------------------------------------------------------- Pain Assessment Details Patient Name: Date of Service: [name] [name] 07/17/2020 2:30 PM Medical Record Number: [id number] Patient Account Number: [id number] Date of Birth/Sex: Treating RN: 1965/05/04 (55 y.o. [name] Primary Care Carlous Olivares: Jenna Luo Other Clinician: Referring Anaiz Qazi: Treating Khyrin Trevathan/Extender: Rosezena Sensor in Treatment: 6 Active Problems Location of Pain Severity and Description of Pain Patient Has Paino No Site Locations Pain Management and Medication Current Pain Management: Electronic Signature(s) Signed: 07/17/2020 5:46:28 PM By: Levan Hurst RN, BSN Entered By: Levan Hurst on 07/17/2020 14:26:34 -------------------------------------------------------------------------------- Patient/Caregiver Education Details Patient Name: Date of Service: [name] [name] 3/31/2022andnbsp2:30 PM Medical Record Number: [id number] Patient Account Number: [id number] Date of Birth/Gender: Treating RN: 1965/05/15 (55 y.o. [name] Primary Care Physician: Jenna Luo Other Clinician: Referring Physician: Treating Physician/Extender: Rosezena Sensor in Treatment: 6 Education Assessment Education Provided To: Patient Education Topics Provided Wound/Skin Impairment: Handouts: Skin Care Do's and Dont's Methods: Explain/Verbal Responses: Reinforcements needed Electronic Signature(s) Signed: 07/17/2020 5:47:39 PM By: Deon Pilling Entered By: Deon Pilling on 07/17/2020 15:07:41 -------------------------------------------------------------------------------- Wound Assessment Details Patient Name: Date of Service: [name] [name], [name] [name] 07/17/2020 2:30 PM Medical Record Number: [id number] Patient Account Number: [id number] Date of Birth/Sex: Treating RN: Aug 19, 1965 (55 y.o. [name] Primary  Care Lileigh Fahringer: Jenna Luo Other Clinician: Referring Ed Mandich: Treating Latavious Bitter/Extender: Rosezena Sensor in Treatment: 6 Wound Status Wound Number: 1 Primary Neuropathic Ulcer-Non Diabetic Etiology:  Wound Location: Right, Plantar Amputation Site - Transmetatarsal Wound Open Wounding Event: Gradually Appeared Status: Date Acquired: 03/05/2020 Comorbid Asthma, Sleep Apnea, Hypertension, Osteoarthritis, Weeks Of Treatment: 6 History: Neuropathy, Seizure Disorder Clustered Wound: No Photos Wound Measurements Length: (cm) 0.8 Width: (cm) 0.6 Depth: (cm) 0.1 Area: (cm) 0.377 Volume: (cm) 0.038 % Reduction in Area: 52% % Reduction in Volume: 83.9% Epithelialization: Small (1-33%) Tunneling: No Undermining: No Wound Description Classification: Full Thickness Without Exposed Support Structures Wound Margin: Distinct, outline attached Exudate Amount: Medium Exudate Type: Serosanguineous Exudate Color: red, brown Foul Odor After Cleansing: No Slough/Fibrino No Wound Bed Granulation Amount: Large (67-100%) Exposed Structure Granulation Quality: Pink, Hyper-granulation Fascia Exposed: No Necrotic Amount: None Present (0%) Fat Layer (Subcutaneous Tissue) Exposed: Yes Tendon Exposed: No Muscle Exposed: No Joint Exposed: No Bone Exposed: No Treatment Notes Wound #1 (Amputation Site - Transmetatarsal) Wound Laterality: Plantar, Right Cleanser Soap and Water Discharge Instruction: May shower and wash wound with dial antibacterial soap and water prior to dressing change. Peri-Wound Care Topical Primary Dressing Hydrofera Blue Ready Foam, 2.5 x2.5 in Discharge Instruction: Apply to wound bed as instructed Secondary Dressing Woven Gauze Sponges 2x2 in Discharge Instruction: Apply over primary dressing as directed. Secured With [employer], 4.5x3.1 (in/yd) Discharge Instruction: Secure with Kerlix as directed. Paper Tape, 2x10  (in/yd) Discharge Instruction: Secure dressing with tape as directed. Compression Wrap Compression Stockings Add-Ons Electronic Signature(s) Signed: 07/17/2020 5:28:26 PM By: Sandre Kitty Signed: 07/17/2020 5:46:28 PM By: Levan Hurst RN, BSN Entered By: Sandre Kitty on 07/17/2020 17:03:18 -------------------------------------------------------------------------------- Vitals Details Patient Name: Date of Service: [name] Shutter D. 07/17/2020 2:30 PM Medical Record Number: [id number] Patient Account Number: [id number] Date of Birth/Sex: Treating RN: 05-13-1965 (55 y.o. [name] Primary Care Janaye Corp: Jenna Luo Other Clinician: Referring Andri Prestia: Treating Lalana Wachter/Extender: Rosezena Sensor in Treatment: 6 Vital Signs Time Taken: 14:26 Temperature (F): 98.1 Height (in): 64 Pulse (bpm): 78 Weight (lbs): 228 Respiratory Rate (breaths/min): 16 Body Mass Index (BMI): 39.1 Blood Pressure (mmHg): 123/84 Reference Range: 80 - 120 mg / dl Electronic Signature(s) Signed: 07/17/2020 5:46:28 PM By: Levan Hurst RN, BSN Entered By: Levan Hurst on 07/17/2020 14:26:29

## 2020-07-17 NOTE — Progress Notes (Signed)
[name] [name], [name] [name] ([id number]) Visit Report for 07/17/2020 Debridement Details Patient Name: Date of Service: [name] [name], [name] [name] 07/17/2020 2:30 PM Medical Record Number: [id number] Patient Account Number: [id number] Date of Birth/Sex: Treating RN: 06-23-1965 (55 y.o. Helene Shoe, Meta.Reding Primary Care Provider: Jenna Luo Other Clinician: Referring Provider: Treating Provider/Extender: Rosezena Sensor in Treatment: 6 Debridement Performed for Assessment: Wound #1 Right,Plantar Amputation Site - Transmetatarsal Performed By: Physician Ricard Dillon., MD Debridement Type: Debridement Level of Consciousness (Pre-procedure): Awake and Alert Pre-procedure Verification/Time Out Yes - 15:00 Taken: Start Time: 15:01 Pain Control: Lidocaine 4% T opical Solution T Area Debrided (L x W): otal 0.8 (cm) x 0.6 (cm) = 0.48 (cm) Tissue and other material debrided: Viable, Non-Viable, Subcutaneous, Skin: Dermis , Skin: Epidermis, Fibrin/Exudate, Hyper-granulation Level: Skin/Subcutaneous Tissue Debridement Description: Excisional Instrument: Curette Bleeding: Minimum Hemostasis Achieved: Silver Nitrate End Time: 15:06 Procedural Pain: 0 Post Procedural Pain: 0 Response to Treatment: Procedure was tolerated well Level of Consciousness (Post- Awake and Alert procedure): Post Debridement Measurements of Total Wound Length: (cm) 0.8 Width: (cm) 0.6 Depth: (cm) 0.1 Volume: (cm) 0.038 Character of Wound/Ulcer Post Debridement: Improved Post Procedure Diagnosis Same as Pre-procedure Electronic Signature(s) Signed: 07/17/2020 5:43:02 PM By: Linton Ham MD Signed: 07/17/2020 5:47:39 PM By: Deon Pilling Entered By: Linton Ham on 07/17/2020 15:43:06 -------------------------------------------------------------------------------- HPI Details Patient Name: Date of Service: [name] Shutter D. 07/17/2020 2:30 PM Medical Record Number: [id number] Patient Account  Number: [id number] Date of Birth/Sex: Treating RN: 06/26/1965 (55 y.o. [name] [name] Primary Care Provider: Jenna Luo Other Clinician: Referring Provider: Treating Provider/Extender: Rosezena Sensor in Treatment: 6 History of Present Illness HPI Description: ADMISSION 06/02/2020 This is a 55 year old woman who has had problems with wounds on her predominantly plantar right foot for quite a period of time. 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 More recently she has had an area developed in the right mid foot at roughly the third metatarsal head. She says this started as a blister of November. Its not been closing. More recently she has been using Silvadene cream she has a cam boot to offload. I note she walks with a cane The patient has had arterial studies done in 2019 at which time her ABI in the right was 1.28 with triphasic waveforms noncompressible on the left with triphasic waveform. Her ABI in our clinic today was 1.3 on the right. 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 She had a foot gastrocnemius recession sometime in the fall 2021 2/21; patient arrives today with a wound measuring slightly smaller 3 x 3 mm however there is circumferential area of erythema around this. She does not have any complaints 2/28; plantar wound on the right first transmetatarsal amputation site. Small oval-shaped wound raised skin around the edges. We have been using Hydrofera Blue. She does not have arterial issues 06/23/2020 upon evaluation today patient appears to be doing decently well. With regard to her wound. There is little bit of slough and some hyper granulation. I do not feel like the PolyMem did too well for her. In fact she may do better  with some Hydrofera Blue. I know we just switch from that but I feel like that may be a better way to continue currently.  3/15; this is a patient with a wound on the plantar right transmetatarsal amputation site probably the second metatarsal head not 1 July as stated previously we have been using polymen switch to [facility] last week. Wound looks about the same to me. We are going to start her on a total contact cast today we have the boot to fit 3/18; first total contact cast change she tolerated this well 3/31; she comes back in with out total contact cast on for 10 days. In spite of this the small wound over the third metatarsal head of her right transmetatarsal amputation site almost looks the same slightly hyper granulated. Circumference is not well adhered. We have been using Hydrofera Blue Electronic Signature(s) Signed: 07/17/2020 5:43:02 PM By: Linton Ham MD Entered By: Linton Ham on 07/17/2020 15:44:01 -------------------------------------------------------------------------------- Physical Exam Details Patient Name: Date of Service: [name] Shutter D. 07/17/2020 2:30 PM Medical Record Number: [id number] Patient Account Number: [id number] Date of Birth/Sex: Treating RN: 1966-01-26 (55 y.o. [name] [name] Primary Care Provider: Jenna Luo Other Clinician: Referring Provider: Treating Provider/Extender: Rosezena Sensor in Treatment: 6 Constitutional Sitting or standing Blood Pressure is within target range for patient.. Pulse regular and within target range for patient.Marland Kitchen Respirations regular, non-labored and within target range.. Temperature is normal and within the target range for the patient.Marland Kitchen Appears in no distress. Notes Wound exam; generally healthy looking granulation but a slight amount of hypergranulation. The skin on the circumference not well adherent. I used a #3 curette remove the overhanging skin silver nitrate on the wound  area and then used a #3 curette to knock back the granulation even further Electronic Signature(s) Signed: 07/17/2020 5:43:02 PM By: Linton Ham MD Entered By: Linton Ham on 07/17/2020 15:44:56 -------------------------------------------------------------------------------- Physician Orders Details Patient Name: Date of Service: [name] Shutter D. 07/17/2020 2:30 PM Medical Record Number: [id number] Patient Account Number: [id number] Date of Birth/Sex: Treating RN: 03/21/1966 (55 y.o. 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 Bathing/ Shower/ Hygiene May shower with protection but do not get wound dressing(s) wet. May shower and wash wound with soap and water. - on days that dressing is changed Off-Loading Total Contact Cast to Right Lower Extremity Wound Treatment Wound #1 - Amputation Site - Transmetatarsal Wound Laterality: Plantar, Right Cleanser: Soap and Water Every Other Day/15 Days Discharge Instructions: May shower and wash wound with dial antibacterial soap and water prior to dressing change. Prim Dressing: Hydrofera Blue Ready Foam, 2.5 x2.5 in Every Other Day/15 Days ary Discharge Instructions: Apply to wound bed as instructed Secondary Dressing: Woven Gauze Sponges 2x2 in (Generic) Every Other Day/15 Days Discharge Instructions: Apply over primary dressing as directed. Secured With: [employer], 4.5x3.1 (in/yd) (Generic) Every Other Day/15 Days Discharge Instructions: Secure with Kerlix as directed. Secured With: Paper Tape, 2x10 (in/yd) (Generic) Every  Other Day/15 Days Discharge Instructions: Secure dressing with tape as directed. Electronic Signature(s) Signed: 07/17/2020 5:43:02 PM By: Linton Ham MD Signed: 07/17/2020 5:47:39 PM By: Deon Pilling Entered By: Deon Pilling on 07/17/2020 15:09:22 -------------------------------------------------------------------------------- Problem List Details Patient Name: Date of Service: [name] Shutter D. 07/17/2020 2:30 PM Medical Record Number: [id number] Patient Account Number: [id number] Date of Birth/Sex: Treating  RN: 05/22/1965 (55 y.o. Helene Shoe, Tammi Klippel Primary Care Provider: Jenna Luo Other Clinician: Referring Provider: Treating Provider/Extender: Rosezena Sensor in Treatment: 6 Active Problems ICD-10 Encounter Code Description Active Date MDM Diagnosis [phone] Non-pressure chronic ulcer of other part of right foot with fat layer exposed 06/02/2020 No Yes G90.09 Other idiopathic peripheral autonomic neuropathy 06/02/2020 No Yes Inactive Problems ICD-10 Code Description Active Date Inactive Date L03.115 Cellulitis of right lower limb 06/09/2020 06/09/2020 Resolved Problems Electronic Signature(s) Signed: 07/17/2020 5:43:02 PM By: Linton Ham MD Entered By: Linton Ham on 07/17/2020 15:40:55 -------------------------------------------------------------------------------- Progress Note Details Patient Name: Date of Service: [name] Shutter D. 07/17/2020 2:30 PM Medical Record Number: [id number] Patient Account Number: [id number] Date of Birth/Sex: Treating RN: Jun 26, 1965 (55 y.o. [name] [name] Primary Care Provider: Jenna Luo Other Clinician: Referring Provider: Treating Provider/Extender: Rosezena Sensor in Treatment: 6 Subjective History of Present Illness (HPI) ADMISSION 06/02/2020 This is a 55 year old woman who has had problems with wounds on her predominantly plantar right foot for quite a period of  time. 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 More recently she has had an area developed in the right mid foot at roughly the third metatarsal head. She says this started as a blister of November. Its not been closing. More recently she has been using Silvadene cream she has a cam boot to offload. I note she walks with a cane The patient has had arterial studies done in 2019 at which time her ABI in the right was 1.28 with triphasic waveforms noncompressible on the left with triphasic waveform. Her ABI in our clinic today was 1.3 on the right. 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 She had a foot gastrocnemius recession sometime in the fall 2021 2/21; patient arrives today with a wound measuring slightly smaller 3 x 3 mm however there is circumferential area of erythema around this. She does not have any complaints 2/28; plantar wound on the right first transmetatarsal amputation site. Small oval-shaped wound raised skin around the edges. We have been using Hydrofera Blue. She does not have arterial issues 06/23/2020 upon evaluation today patient appears to be doing decently well. With regard to her wound. There is little bit of slough and some hyper granulation. I do not feel like the PolyMem did too well for her. In fact she may do better with some Hydrofera Blue. I know we just switch from that but I feel like that may be a better way to continue currently. 3/15; this is a patient with a wound on the plantar right transmetatarsal amputation site probably the second metatarsal head not 1 July as stated previously we have been using polymen switch to [facility] last week. Wound looks about the same to me. We are going to start her on a total contact cast today  we have the boot to fit 3/18; first total contact cast change she tolerated this well 3/31; she comes back in with out total contact cast on for 10 days. In spite of this the small wound over the third metatarsal head of her right transmetatarsal amputation site almost looks the same slightly hyper granulated. Circumference is not well adhered. We have been using Hydrofera Blue Objective Constitutional Sitting or standing Blood  Pressure is within target range for patient.. Pulse regular and within target range for patient.Marland Kitchen Respirations regular, non-labored and within target range.. Temperature is normal and within the target range for the patient.Marland Kitchen Appears in no distress. Vitals Time Taken: 2:26 PM, Height: 64 in, Weight: 228 lbs, BMI: 39.1, Temperature: 98.1 F, Pulse: 78 bpm, Respiratory Rate: 16 breaths/min, Blood Pressure: 123/84 mmHg. General Notes: Wound exam; generally healthy looking granulation but a slight amount of hypergranulation. The skin on the circumference not well adherent. I used a #3 curette remove the overhanging skin silver nitrate on the wound area and then used a #3 curette to knock back the granulation even further Integumentary (Hair, Skin) Wound #1 status is Open. Original cause of wound was Gradually Appeared. The date acquired was: 03/05/2020. The wound has been in treatment 6 weeks. The wound is located on the Right,Plantar Amputation Site - Transmetatarsal. The wound measures 0.8cm length x 0.6cm width x 0.1cm depth; 0.377cm^2 area and 0.038cm^3 volume. There is Fat Layer (Subcutaneous Tissue) exposed. There is no tunneling or undermining noted. There is a medium amount of serosanguineous drainage noted. The wound margin is distinct with the outline attached to the wound base. There is large (67-100%) pink, hyper - granulation within the wound bed. There is no necrotic tissue within the wound bed. 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 There was a Excisional Skin/Subcutaneous Tissue Debridement with a total area of 0.48 sq cm performed by Ricard Dillon., MD. With the following instrument(s): Curette to remove Viable and Non-Viable tissue/material. Material removed includes Subcutaneous Tissue, Skin: Dermis, Skin: Epidermis, Fibrin/Exudate, and Hyper-granulation after achieving pain control using Lidocaine 4% Topical Solution. A time out was conducted at 15:00, prior to the start of the procedure. A Minimum amount of bleeding was controlled with Silver Nitrate. The procedure was tolerated well with a pain level of 0 throughout and a pain level of 0 following the procedure. Post Debridement Measurements: 0.8cm length x 0.6cm width x 0.1cm depth; 0.038cm^3 volume. Character of Wound/Ulcer Post Debridement is improved. Post procedure Diagnosis Wound #1: Same as Pre-Procedure Pre-procedure diagnosis of Wound #1 is a Neuropathic Ulcer-Non Diabetic located on the Right,Plantar Amputation Site - Transmetatarsal . There was a Total Contact Cast Procedure by Ricard Dillon., MD. Post procedure Diagnosis Wound #1: Same as Pre-Procedure Plan Follow-up Appointments: Return Appointment in 1 week. Bathing/ Shower/ Hygiene: May shower with protection but do not get wound dressing(s) wet. May shower and wash wound with soap and water. - on days that dressing is changed Off-Loading: T Contact Cast to Right Lower Extremity otal WOUND #1: - Amputation Site - Transmetatarsal Wound Laterality: Plantar, Right Cleanser: Soap and Water Every Other Day/15 Days Discharge Instructions: May shower and wash wound with dial antibacterial soap and water prior to dressing change. Prim Dressing:  Hydrofera Blue Ready Foam, 2.5 x2.5 in Every Other Day/15 Days ary Discharge Instructions: Apply to wound bed as instructed Secondary Dressing: Woven Gauze Sponges 2x2 in (Generic) Every Other Day/15 Days Discharge Instructions: Apply over primary dressing as directed. Secured With: [employer], 4.5x3.1 (in/yd) (Generic) Every Other Day/15 Days Discharge Instructions: Secure with Kerlix as directed. Secured With: Paper T ape, 2x10 (in/yd) (Generic) Every Other Day/15 Days Discharge Instructions: Secure dressing with tape as  directed. #1 I find this an odd little wound. 2. Slight hypergranulation not back nonadherent skin and debris around the circumference removed 3. I am still going to use Hydrofera Blue. T contact cast otal 4. She has the same degree of erythema around this but no tenderness I think the first time I saw her I put her on antibiotics but this did not really do anything Electronic Signature(s) Signed: 07/17/2020 5:43:02 PM By: Linton Ham MD Entered By: Linton Ham on 07/17/2020 15:46:45 -------------------------------------------------------------------------------- Total Contact Cast Details Patient Name: Date of Service: [name] Shutter D. 07/17/2020 2:30 PM Medical Record Number: [id number] Patient Account Number: [id number] Date of Birth/Sex: Treating RN: Mar 21, 1966 (55 y.o. [name] [name] Primary Care Provider: Jenna Luo Other Clinician: Referring Provider: Treating Provider/Extender: Rosezena Sensor in Treatment: 6 T Contact Cast Applied for Wound Assessment: otal Wound #1 Right,Plantar Amputation Site - Transmetatarsal Performed By: Physician Ricard Dillon., MD Post Procedure Diagnosis Same as Pre-procedure Electronic Signature(s) Signed: 07/17/2020 5:43:02 PM By: Linton Ham MD Entered By: Linton Ham on 07/17/2020  15:43:17 -------------------------------------------------------------------------------- SuperBill Details Patient Name: Date of Service: [name] 07/17/2020 Medical Record Number: [id number] Patient Account Number: [id number] Date of Birth/Sex: Treating RN: [birth year] (55 y.o. [name] [name] Primary Care Provider: Jenna Luo Other Clinician: Referring Provider: Treating Provider/Extender: Rosezena Sensor in Treatment: 6 Diagnosis Coding ICD-10 Codes Code Description [phone] Non-pressure chronic ulcer of other part of right foot with fat layer exposed G90.09 Other idiopathic peripheral autonomic neuropathy Facility Procedures CPT4 Code: [id number] Description: 03009 - DEB SUBQ TISSUE 20 SQ CM/< ICD-10 Diagnosis Description L97.512 Non-pressure chronic ulcer of other part of right foot with fat layer exposed Modifier: Quantity: 1 Physician Procedures : CPT4 Code Description Modifier [id number] - WC PHYS SUBQ TISS 20 SQ CM ICD-10 Diagnosis Description L97.512 Non-pressure chronic ulcer of other part of right foot with fat layer exposed Quantity: 1 Electronic Signature(s) Signed: 07/17/2020 5:43:02 PM By: Linton Ham MD Entered By: Linton Ham on 07/17/2020 15:46:56

## 2020-07-24 ENCOUNTER — Other Ambulatory Visit: Payer: Self-pay

## 2020-07-24 ENCOUNTER — Encounter (HOSPITAL_BASED_OUTPATIENT_CLINIC_OR_DEPARTMENT_OTHER): Payer: 59 | Attending: Internal Medicine | Admitting: Internal Medicine

## 2020-07-24 DIAGNOSIS — L97512 Non-pressure chronic ulcer of other part of right foot with fat layer exposed: Secondary | ICD-10-CM | POA: Insufficient documentation

## 2020-07-24 DIAGNOSIS — G9009 Other idiopathic peripheral autonomic neuropathy: Secondary | ICD-10-CM | POA: Diagnosis not present

## 2020-07-24 DIAGNOSIS — R7303 Prediabetes: Secondary | ICD-10-CM | POA: Diagnosis not present

## 2020-07-24 DIAGNOSIS — Z89421 Acquired absence of other right toe(s): Secondary | ICD-10-CM | POA: Diagnosis not present

## 2020-07-25 NOTE — Progress Notes (Signed)
[name] [name] ([id number]) Visit Report for 07/24/2020 Debridement Details Patient Name: Date of Service: [name] [name], [name] [name] 07/24/2020 3:15 PM Medical Record Number: [id number] Patient Account Number: [id number] Date of Birth/Sex: Treating RN: April 17, 1966 (55 y.o. [name] [name], [name] [name] Primary Care Provider: Jenna [name] Other Clinician: Referring Provider: Treating Provider/Extender: [name] [name] in Treatment: 7 Debridement Performed for Assessment: Wound #1 Right,Plantar Amputation Site - Transmetatarsal Performed By: Physician [name] [name]., MD Debridement Type: Debridement Level of Consciousness (Pre-procedure): Awake and Alert Pre-procedure Verification/Time Out Yes - 16:04 Taken: Start Time: 16:05 Pain Control: Lidocaine 4% T opical Solution T Area Debrided (L x W): otal 0.5 (cm) x 0.5 (cm) = 0.25 (cm) Tissue and other material debrided: Viable, Non-Viable, Callus, Skin: Dermis Level: Skin/Dermis Debridement Description: Selective/Open Wound Instrument: Curette Bleeding: Minimum Hemostasis Achieved: Pressure End Time: 16:07 Procedural Pain: 0 Post Procedural Pain: 0 Response to Treatment: Procedure was tolerated well Level of Consciousness (Post- Awake and Alert procedure): Post Debridement Measurements of Total Wound Length: (cm) 0.4 Width: (cm) 0.3 Depth: (cm) 0.1 Volume: (cm) 0.009 Character of Wound/Ulcer Post Debridement: Improved Post Procedure Diagnosis Same as Pre-procedure Electronic Signature(s) Signed: 07/24/2020 5:22:04 PM By: [name] Ham MD Signed: 07/25/2020 4:53:51 PM By: [name] [name] Entered By: [name] [name] on 07/24/2020 16:08:15 -------------------------------------------------------------------------------- HPI Details Patient Name: Date of Service: [name] Shutter D. 07/24/2020 3:15 PM Medical Record Number: [id number] Patient Account Number: [id number] Date of Birth/Sex: Treating RN: Dec 06, 1965 (55 y.o. [name] [name] Primary Care Provider: Jenna [name] Other Clinician: Referring Provider: Treating Provider/Extender: [name] [name] in Treatment: 7 History of Present Illness HPI Description: ADMISSION 06/02/2020 This is a 55 year old woman who has had problems with wounds on her predominantly plantar right foot for quite a period of time. 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 More recently she has had an area developed in the right mid foot at roughly the third metatarsal head. She says this started as a blister of November. Its not been closing. More recently she has been using Silvadene cream she has a cam boot to offload. I note she walks with a cane The patient has had arterial studies done in 2019 at which time her ABI in the right was 1.28 with triphasic waveforms noncompressible on the left with triphasic waveform. Her ABI in our clinic today was 1.3 on the right. 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 She had a foot gastrocnemius recession sometime in the fall 2021 2/21; patient arrives today with a wound measuring slightly smaller 3 x 3 mm however there is circumferential area of erythema around this. She does not have any complaints 2/28; plantar wound on the right first transmetatarsal amputation site. Small oval-shaped wound raised skin around the edges. We have been using Hydrofera Blue. She does not have arterial issues 06/23/2020 upon evaluation today patient appears to be doing decently well. With regard to her wound. There is little bit of slough and some hyper granulation. I do not feel like the PolyMem did too well for her. In fact she may do better with some Hydrofera Blue. I know we just switch from that but I feel like  that may be a better way to continue currently. 3/15; this is a patient with  a wound on the plantar right transmetatarsal amputation site probably the second metatarsal head not 1 July as stated previously we have been using polymen switch to [facility] last week. Wound looks about the same to me. We are going to start her on a total contact cast today we have the boot to fit 3/18; first total contact cast change she tolerated this well 3/31; she comes back in with out total contact cast on for 10 days. In spite of this the small wound over the third metatarsal head of her right transmetatarsal amputation site almost looks the same slightly hyper granulated. Circumference is not well adhered. We have been using Hydrofera Blue 4/7 she comes back in in her total contact cast. The wound is smaller epithelializing and looks like it on its way to closing. We have been using Hydrofera Blue over the wound area She has a right transmetatarsal amputation. She tells [place] that she has been to [facility] in the past and they did a modified [name] for her however it did not keep this area epithelialized. She is going to bring that in next week for [place] to look at. It does not sound however that she was really offloading or padding this area at all Electronic Signature(s) Signed: 07/24/2020 5:22:04 PM By: [name] Ham MD Entered By: [name] [name] on 07/24/2020 17:02:42 -------------------------------------------------------------------------------- Physical Exam Details Patient Name: Date of Service: [name] Shutter D. 07/24/2020 3:15 PM Medical Record Number: [id number] Patient Account Number: [id number] Date of Birth/Sex: Treating RN: 1966/03/22 (55 y.o. [name] [name] Primary Care Provider: Jenna [name] Other Clinician: Referring Provider: Treating Provider/Extender: [name] [name] in Treatment: 7 Constitutional Sitting or standing Blood Pressure is within target  range for patient.. Pulse regular and within target range for patient.Marland Kitchen Respirations regular, non-labored and within target range.. Temperature is normal and within the target range for the patient.Marland Kitchen Appears in no distress. Notes Wound exam; generally healthy looking wound. No hypergranulation. Circumferential dry skin and callus removed from the wound margins. I did not get into the wound itself. Previously this is been hyper granulated but not currently. It looks like this is on its way to closure no evidence of infection Electronic Signature(s) Signed: 07/24/2020 5:22:04 PM By: [name] Ham MD Entered By: [name] [name] on 07/24/2020 17:03:30 -------------------------------------------------------------------------------- Physician Orders Details Patient Name: Date of Service: [name] Shutter D. 07/24/2020 3:15 PM Medical Record Number: [id number] Patient Account Number: [id number] Date of Birth/Sex: Treating RN: 1965/05/22 (55 y.o. [name] [name] Primary Care Provider: Jenna [name] Other Clinician: Referring Provider: Treating Provider/Extender: [name] [name] in Treatment: 7 Verbal / Phone Orders: No Diagnosis Coding ICD-10 Coding Code Description [phone] Non-pressure chronic ulcer of other part of right foot with fat layer exposed G90.09 Other idiopathic peripheral autonomic neuropathy Follow-up Appointments ppointment in 1 week. - Bring in orthotic [name] for left foot next week for MD to evaluate. Return A Bathing/ Shower/ Hygiene May shower with protection but do not get wound dressing(s) wet. May shower and wash wound with soap and water. - on days that dressing is changed Off-Loading Total Contact Cast to Right Lower Extremity Wound Treatment Wound #1 - Amputation Site - Transmetatarsal Wound Laterality: Plantar, Right Cleanser: Soap and Water Every Other Day/15 Days Discharge Instructions: May shower and wash wound with dial antibacterial  soap and water prior to dressing change. Prim Dressing: Hydrofera Blue Ready Foam, 2.5 x2.5 in Every Other Day/15 Days ary Discharge Instructions: Apply to wound bed  as instructed Secondary Dressing: Woven Gauze Sponges 2x2 in (Generic) Every Other Day/15 Days Discharge Instructions: Apply over primary dressing as directed. Secured With: [employer], 4.5x3.1 (in/yd) (Generic) Every Other Day/15 Days Discharge Instructions: Secure with Kerlix as directed. Secured With: Paper Tape, 2x10 (in/yd) (Generic) Every Other Day/15 Days Discharge Instructions: Secure dressing with tape as directed. Electronic Signature(s) Signed: 07/24/2020 5:22:04 PM By: [name] Ham MD Signed: 07/25/2020 4:53:51 PM By: [name] [name] Entered By: [name] [name] on 07/24/2020 16:08:53 -------------------------------------------------------------------------------- Problem List Details Patient Name: Date of Service: [name] Shutter D. 07/24/2020 3:15 PM Medical Record Number: [id number] Patient Account Number: [id number] Date of Birth/Sex: Treating RN: 1965/05/16 (55 y.o. [name] [name], Tammi Klippel Primary Care Provider: Jenna [name] Other Clinician: Referring Provider: Treating Provider/Extender: [name] [name] in Treatment: 7 Active Problems ICD-10 Encounter Code Description Active Date MDM Diagnosis L97.512 Non-pressure chronic ulcer of other part of right foot with fat layer exposed 06/02/2020 No Yes G90.09 Other idiopathic peripheral autonomic neuropathy 06/02/2020 No Yes Inactive Problems ICD-10 Code Description Active Date Inactive Date L03.115 Cellulitis of right lower limb 06/09/2020 06/09/2020 Resolved Problems Electronic Signature(s) Signed: 07/24/2020 5:22:04 PM By: [name] Ham MD Entered By: [name] [name] on 07/24/2020 17:01:08 -------------------------------------------------------------------------------- Progress Note Details Patient Name: Date of Service: [name] Shutter D. 07/24/2020 3:15 PM Medical Record Number: [id number] Patient Account Number: [id number] Date of Birth/Sex: Treating RN: Jan 19, 1966 (55 y.o. [name] [name] Primary Care Provider: Jenna [name] Other Clinician: Referring Provider: Treating Provider/Extender: [name] [name] in Treatment: 7 Subjective History of Present Illness (HPI) ADMISSION 06/02/2020 This is a 55 year old woman who has had problems with wounds on her predominantly plantar right foot for quite a period of time. 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 More recently she has had an area developed in the right mid foot at roughly the third metatarsal head. She says this started as a blister of November. Its not been closing. More recently she has been using Silvadene cream she has a cam boot to offload. I note she walks with a cane The patient has had arterial studies done in 2019 at which time her ABI in the right was 1.28 with triphasic waveforms noncompressible on the left with triphasic waveform. Her ABI in our clinic today was 1.3 on the right. 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 She had a foot gastrocnemius recession sometime in the fall 2021 2/21; patient arrives today with a wound measuring slightly smaller 3 x 3 mm however there is circumferential area of erythema around this. She does not have any complaints 2/28; plantar wound on the right first transmetatarsal amputation site. Small oval-shaped wound raised skin around the edges. We have been using Hydrofera Blue. She does not have arterial issues 06/23/2020 upon evaluation today patient appears to be doing decently well. With regard to her wound. There is little bit of slough and some hyper granulation.  I do not feel like the PolyMem did too well for her. In fact she may do better with some Hydrofera Blue. I know we just switch from that but I feel like that may be a better way to continue currently. 3/15; this is a patient with a wound on the plantar right transmetatarsal amputation site probably the  second metatarsal head not 1 July as stated previously we have been using polymen switch to [facility] last week. Wound looks about the same to me. We are going to start her on a total contact cast today we have the boot to fit 3/18; first total contact cast change she tolerated this well 3/31; she comes back in with out total contact cast on for 10 days. In spite of this the small wound over the third metatarsal head of her right transmetatarsal amputation site almost looks the same slightly hyper granulated. Circumference is not well adhered. We have been using Hydrofera Blue 4/7 she comes back in in her total contact cast. The wound is smaller epithelializing and looks like it on its way to closing. We have been using Hydrofera Blue over the wound area She has a right transmetatarsal amputation. She tells [place] that she has been to [facility] in the past and they did a modified [name] for her however it did not keep this area epithelialized. She is going to bring that in next week for [place] to look at. It does not sound however that she was really offloading or padding this area at all Objective Constitutional Sitting or standing Blood Pressure is within target range for patient.. Pulse regular and within target range for patient.Marland Kitchen Respirations regular, non-labored and within target range.. Temperature is normal and within the target range for the patient.Marland Kitchen Appears in no distress. Vitals Time Taken: 3:29 PM, Height: 64 in, Weight: 228 lbs, BMI: 39.1, Temperature: 98.7 F, Pulse: 96 bpm, Respiratory Rate: 16 breaths/min, Blood Pressure: 110/72 mmHg. General Notes: Wound exam; generally  healthy looking wound. No hypergranulation. Circumferential dry skin and callus removed from the wound margins. I did not get into the wound itself. Previously this is been hyper granulated but not currently. It looks like this is on its way to closure no evidence of infection Integumentary (Hair, Skin) Wound #1 status is Open. Original cause of wound was Gradually Appeared. The date acquired was: 03/05/2020. The wound has been in treatment 7 weeks. The wound is located on the Right,Plantar Amputation Site - Transmetatarsal. The wound measures 0.4cm length x 0.3cm width x 0.1cm depth; 0.094cm^2 area and 0.009cm^3 volume. There is Fat Layer (Subcutaneous Tissue) exposed. There is no tunneling or undermining noted. There is a medium amount of serosanguineous drainage noted. The wound margin is distinct with the outline attached to the wound base. There is large (67-100%) pink granulation within the wound bed. There is no necrotic tissue within the wound bed. Assessment Active Problems ICD-10 Non-pressure chronic ulcer of other part of right foot with fat layer exposed Other idiopathic peripheral autonomic neuropathy Procedures Wound #1 Pre-procedure diagnosis of Wound #1 is a Neuropathic Ulcer-Non Diabetic located on the Right,Plantar Amputation Site - Transmetatarsal . There was a Selective/Open Wound Skin/Dermis Debridement with a total area of 0.25 sq cm performed by [name] [name]., MD. With the following instrument(s): Curette to remove Viable and Non-Viable tissue/material. Material removed includes Callus and Skin: Dermis and after achieving pain control using Lidocaine 4% T opical Solution. A time out was conducted at 16:04, prior to the start of the procedure. A Minimum amount of bleeding was controlled with Pressure. The procedure was tolerated well with a pain level of 0 throughout and a pain level of 0 following the procedure. Post Debridement Measurements: 0.4cm length x 0.3cm width  x 0.1cm depth; 0.009cm^3 volume. Character of Wound/Ulcer Post Debridement is improved. Post procedure Diagnosis  Wound #1: Same as Pre-Procedure Pre-procedure diagnosis of Wound #1 is a Neuropathic Ulcer-Non Diabetic located on the Right,Plantar Amputation Site - Transmetatarsal . There was a Total Contact Cast Procedure by [name] [name]., MD. Post procedure Diagnosis Wound #1: Same as Pre-Procedure Plan Follow-up Appointments: Return Appointment in 1 week. - Bring in orthotic [name] for left foot next week for MD to evaluate. Bathing/ Shower/ Hygiene: May shower with protection but do not get wound dressing(s) wet. May shower and wash wound with soap and water. - on days that dressing is changed Off-Loading: T Contact Cast to Right Lower Extremity otal WOUND #1: - Amputation Site - Transmetatarsal Wound Laterality: Plantar, Right Cleanser: Soap and Water Every Other Day/15 Days Discharge Instructions: May shower and wash wound with dial antibacterial soap and water prior to dressing change. Prim Dressing: Hydrofera Blue Ready Foam, 2.5 x2.5 in Every Other Day/15 Days ary Discharge Instructions: Apply to wound bed as instructed Secondary Dressing: Woven Gauze Sponges 2x2 in (Generic) Every Other Day/15 Days Discharge Instructions: Apply over primary dressing as directed. Secured With: [employer], 4.5x3.1 (in/yd) (Generic) Every Other Day/15 Days Discharge Instructions: Secure with Kerlix as directed. Secured With: Paper T ape, 2x10 (in/yd) (Generic) Every Other Day/15 Days Discharge Instructions: Secure dressing with tape as directed. 1. Hydrofera Blue to continue under a total contact cast 2. I think this will be epithelialized next week but if along with my standard practice I will continue a cast for a week after this in order to ensure some maturation. 3. She is going to bring her [name] in next week for [place] to look at. She told us that this did not maintain skin closure  when she tried to use a but she may not have been padding this area adequately. Electronic Signature(s) Signed: 07/24/2020 5:22:04 PM By: [name] Ham MD Entered By: [name] [name] on 07/24/2020 17:04:30 -------------------------------------------------------------------------------- Total Contact Cast Details Patient Name: Date of Service: [name] [name] 07/24/2020 3:15 PM Medical Record Number: [id number] Patient Account Number: [id number] Date of Birth/Sex: Treating RN: 07-22-1965 (55 y.o. [name] [name] Primary Care Provider: Jenna [name] Other Clinician: Referring Provider: Treating Provider/Extender: [name] [name] in Treatment: 7 T Contact Cast Applied for Wound Assessment: otal Wound #1 Right,Plantar Amputation Site - Transmetatarsal Performed By: Physician [name] [name]., MD Post Procedure Diagnosis Same as Pre-procedure Electronic Signature(s) Signed: 07/24/2020 5:22:04 PM By: [name] Ham MD Entered By: [name] [name] on 07/24/2020 17:01:32 -------------------------------------------------------------------------------- SuperBill Details Patient Name: Date of Service: [name] [name] 07/24/2020 Medical Record Number: [id number] Patient Account Number: [id number] Date of Birth/Sex: Treating RN: 09-08-1965 (55 y.o. [name] [name] Primary Care Provider: Jenna [name] Other Clinician: Referring Provider: Treating Provider/Extender: [name] [name] in Treatment: 7 Diagnosis Coding ICD-10 Codes Code Description [phone] Non-pressure chronic ulcer of other part of right foot with fat layer exposed G90.09 Other idiopathic peripheral autonomic neuropathy Facility Procedures CPT4 Code: [id number] Description: [phone] - DEBRIDE WOUND 1ST 20 SQ CM OR < ICD-10 Diagnosis Description L97.512 Non-pressure chronic ulcer of other part of right foot with fat layer exposed Modifier: Quantity: 1 Physician  Procedures Electronic Signature(s) Signed: 07/24/2020 5:22:04 PM By: [name] Ham MD Entered By: [name] [name] on 07/24/2020 17:04:39

## 2020-07-25 NOTE — Progress Notes (Signed)
[name], [name] ([id number]) Visit Report for 07/24/2020 Arrival Information Details Patient Name: Date of Service: [name] [name], [name] [name] 07/24/2020 3:15 PM Medical Record Number: [id number] Patient Account Number: [id number] Date of Birth/Sex: Treating RN: 12-Jul-1965 (55 y.o. [name] [name], Meta.Reding Primary Care Druscilla Petsch: Jenna Luo Other Clinician: Referring Simran Mannis: Treating Jantz Main/Extender: Rosezena Sensor in Treatment: 7 Visit Information History Since Last Visit Added or deleted any medications: No Patient Arrived: [name] [name] Any new allergies or adverse reactions: No Arrival Time: 15:27 Had a fall or experienced change in No Accompanied By: self activities of daily living that may affect Transfer Assistance: None risk of falls: Patient Identification Verified: Yes Signs or symptoms of abuse/neglect since last visito No Secondary Verification Process Completed: Yes Hospitalized since last visit: No Patient Requires Transmission-Based Precautions: No Implantable device outside of the clinic excluding No Patient Has Alerts: No cellular tissue based products placed in the center since last visit: Has Dressing in Place as Prescribed: Yes Pain Present Now: No Electronic Signature(s) Signed: 07/24/2020 4:58:23 PM By: Sandre Kitty Entered By: Sandre Kitty on 07/24/2020 15:29:20 -------------------------------------------------------------------------------- Lower Extremity Assessment Details Patient Name: Date of Service: [name] [name], [name] [name] 07/24/2020 3:15 PM Medical Record Number: [id number] Patient Account Number: [id number] Date of Birth/Sex: Treating RN: August 07, 1965 (55 y.o. [name] [name] Primary Care Reginold Beale: Jenna Luo Other Clinician: Referring Pantera Winterrowd: Treating Kiley Torrence/Extender: Rosezena Sensor in Treatment: 7 Edema Assessment Assessed: Shirlyn Goltz: No] [Right: Yes] Edema: [Left: N] [Right: o] Calf Left: Right: Point  of Measurement: 34 cm From Medial Instep 34 cm Ankle Left: Right: Point of Measurement: 9 cm From Medial Instep 23 cm Vascular Assessment Pulses: Dorsalis Pedis Palpable: [Right:Yes] Electronic Signature(s) Signed: 07/25/2020 4:53:51 PM By: Deon Pilling Entered By: Deon Pilling on 07/24/2020 15:53:05 -------------------------------------------------------------------------------- Multi Wound Chart Details Patient Name: Date of Service: [name] Shutter D. 07/24/2020 3:15 PM Medical Record Number: [id number] Patient Account Number: [id number] Date of Birth/Sex: Treating RN: June 30, 1965 (55 y.o. [name] [name] Primary Care Jessicalynn Deshong: Jenna Luo Other Clinician: Referring Jamerius Boeckman: Treating Lazer Wollard/Extender: Rosezena Sensor in Treatment: 7 Vital Signs Height(in): 64 Pulse(bpm): 96 Weight(lbs): 228 Blood Pressure(mmHg): 110/72 Body Mass Index(BMI): 39 Temperature(F): 98.7 Respiratory Rate(breaths/min): 16 Photos: [N/A:N/A] Right, Plantar Amputation Site - N/A N/A Wound Location: Transmetatarsal Gradually Appeared N/A N/A Wounding Event: Neuropathic Ulcer-Non Diabetic N/A N/A Primary Etiology: Asthma, Sleep Apnea, Hypertension, N/A N/A Comorbid History: Osteoarthritis, Neuropathy, Seizure Disorder 03/05/2020 N/A N/A Date Acquired: 7 N/A N/A Weeks of Treatment: Open N/A N/A Wound Status: 0.4x0.3x0.1 N/A N/A Measurements L x W x D (cm) 0.094 N/A N/A A (cm) : rea 0.009 N/A N/A Volume (cm) : 88.00% N/A N/A % Reduction in A rea: 96.20% N/A N/A % Reduction in Volume: Full Thickness Without Exposed N/A N/A Classification: Support Structures Medium N/A N/A Exudate A mount: Serosanguineous N/A N/A Exudate Type: red, brown N/A N/A Exudate Color: Distinct, outline attached N/A N/A Wound Margin: Large (67-100%) N/A N/A Granulation A mount: Pink N/A N/A Granulation Quality: None Present (0%) N/A N/A Necrotic A mount: Fat Layer  (Subcutaneous Tissue): Yes N/A N/A Exposed Structures: Fascia: No Tendon: No Muscle: No Joint: No Bone: No Medium (34-66%) N/A N/A Epithelialization: Debridement - Selective/Open Wound N/A N/A Debridement: Pre-procedure Verification/Time Out 16:04 N/A N/A Taken: Lidocaine 4% Topical Solution N/A N/A Pain Control: Callus N/A N/A Tissue Debrided: Skin/Dermis N/A N/A Level: 0.25 N/A N/A Debridement A (sq cm): rea Curette N/A N/A Instrument: Minimum N/A N/A Bleeding: Pressure N/A N/A Hemostasis Achieved: 0 N/A  N/A Procedural Pain: 0 N/A N/A Post Procedural Pain: Procedure was tolerated well N/A N/A Debridement Treatment Response: 0.4x0.3x0.1 N/A N/A Post Debridement Measurements L x W x D (cm) 0.009 N/A N/A Post Debridement Volume: (cm) Debridement N/A N/A Procedures Performed: T Contact Cast otal Treatment Notes Electronic Signature(s) Signed: 07/24/2020 5:22:04 PM By: Linton Ham MD Signed: 07/25/2020 4:53:51 PM By: Deon Pilling Entered By: Linton Ham on 07/24/2020 17:01:15 -------------------------------------------------------------------------------- Multi-Disciplinary Care Plan Details Patient Name: Date of Service: [name] Shutter D. 07/24/2020 3:15 PM Medical Record Number: [id number] Patient Account Number: [id number] Date of Birth/Sex: Treating RN: August 02, 1965 (55 y.o. [name] [name] Primary Care Jenniferlynn Saad: Jenna Luo Other Clinician: Referring Sunni Richardson: Treating Saia Derossett/Extender: Rosezena Sensor in Treatment: 7 Multidisciplinary Care Plan reviewed with physician Active Inactive Wound/Skin Impairment Nursing Diagnoses: Impaired tissue integrity Knowledge deficit related to ulceration/compromised skin integrity Goals: Patient/caregiver will verbalize understanding of skin care regimen Date Initiated: 06/02/2020 Target Resolution Date: 08/15/2020 Goal Status: Active Ulcer/skin breakdown will have a volume  reduction of 30% by week 4 Date Initiated: 06/02/2020 Date Inactivated: 07/17/2020 Target Resolution Date: 07/04/2020 Unmet Reason: no major change see Goal Status: Unmet measurements. Interventions: Assess patient/caregiver ability to obtain necessary supplies Assess patient/caregiver ability to perform ulcer/skin care regimen upon admission and as needed Assess ulceration(s) every visit Provide education on ulcer and skin care Notes: Electronic Signature(s) Signed: 07/25/2020 4:53:51 PM By: Deon Pilling Entered By: Deon Pilling on 07/24/2020 15:45:09 -------------------------------------------------------------------------------- Pain Assessment Details Patient Name: Date of Service: [name] [name], [name] [name] 07/24/2020 3:15 PM Medical Record Number: [id number] Patient Account Number: [id number] Date of Birth/Sex: Treating RN: 1965/08/17 (55 y.o. [name] [name] Primary Care Kenyan Karnes: Jenna Luo Other Clinician: Referring Giani Betzold: Treating Geo Slone/Extender: Rosezena Sensor in Treatment: 7 Active Problems Location of Pain Severity and Description of Pain Patient Has Paino No Site Locations Pain Management and Medication Current Pain Management: Electronic Signature(s) Signed: 07/24/2020 4:58:23 PM By: Sandre Kitty Signed: 07/25/2020 4:53:51 PM By: Deon Pilling Entered By: Sandre Kitty on 07/24/2020 15:29:46 -------------------------------------------------------------------------------- Patient/Caregiver Education Details Patient Name: Date of Service: [name] [name] 4/7/2022andnbsp3:15 PM Medical Record Number: [id number] Patient Account Number: [id number] Date of Birth/Gender: Treating RN: [birth year] (55 y.o. [name] [name] Primary Care Physician: Jenna Luo Other Clinician: Referring Physician: Treating Physician/Extender: Rosezena Sensor in Treatment: 7 Education Assessment Education Provided  To: Patient Education Topics Provided Wound/Skin Impairment: Handouts: Skin Care Do's and Dont's Methods: Explain/Verbal Responses: Reinforcements needed Electronic Signature(s) Signed: 07/25/2020 4:53:51 PM By: Deon Pilling Signed: 07/25/2020 4:53:51 PM By: Deon Pilling Entered By: Deon Pilling on 07/24/2020 15:45:20 -------------------------------------------------------------------------------- Wound Assessment Details Patient Name: Date of Service: [name] Shutter D. 07/24/2020 3:15 PM Medical Record Number: [id number] Patient Account Number: [id number] Date of Birth/Sex: Treating RN: May 15, 1965 (55 y.o. 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, Plantar Amputation Site - Transmetatarsal Wound Open Wounding Event: Gradually Appeared Status: Date Acquired: 03/05/2020 Comorbid Asthma, Sleep Apnea, Hypertension, Osteoarthritis, Weeks Of Treatment: 7 History: Neuropathy, Seizure Disorder Clustered Wound: No Photos Wound Measurements Length: (cm) 0.4 Width: (cm) 0.3 Depth: (cm) 0.1 Area: (cm) 0.094 Volume: (cm) 0.009 % Reduction in Area: 88% % Reduction in Volume: 96.2% Epithelialization: Medium (34-66%) Tunneling: No Undermining: No Wound Description Classification: Full Thickness Without Exposed Support Structures Wound Margin: Distinct, outline attached Exudate Amount: Medium Exudate Type: Serosanguineous Exudate  Color: red, brown Foul Odor After Cleansing: No Slough/Fibrino No Wound Bed Granulation Amount: Large (67-100%) Exposed Structure Granulation Quality: Pink Fascia Exposed: No Necrotic Amount: None Present (0%) Fat Layer (Subcutaneous Tissue) Exposed: Yes Tendon Exposed: No Muscle Exposed: No Joint Exposed: No Bone  Exposed: No Electronic Signature(s) Signed: 07/24/2020 4:58:23 PM By: Sandre Kitty Signed: 07/25/2020 4:53:51 PM By: Deon Pilling Entered By: Sandre Kitty on 07/24/2020 16:46:27 -------------------------------------------------------------------------------- [place] Details Patient Name: Date of Service: [name] Shutter D. 07/24/2020 3:15 PM Medical Record Number: [id number] Patient Account Number: [id number] Date of Birth/Sex: Treating RN: 1965/06/03 (55 y.o. [name] [name], Tammi Klippel Primary Care Riki Berninger: Jenna Luo Other Clinician: Referring Abdulahi Schor: Treating Ayonna Speranza/Extender: Rosezena Sensor in Treatment: 7 Vital Signs Time Taken: 15:29 Temperature (F): 98.7 Height (in): 64 Pulse (bpm): 96 Weight (lbs): 228 Respiratory Rate (breaths/min): 16 Body Mass Index (BMI): 39.1 Blood Pressure (mmHg): 110/72 Reference Range: 80 - 120 mg / dl Electronic Signature(s) Signed: 07/24/2020 4:58:23 PM By: Sandre Kitty Entered By: Sandre Kitty on 07/24/2020 15:29:38

## 2020-07-31 ENCOUNTER — Encounter (HOSPITAL_BASED_OUTPATIENT_CLINIC_OR_DEPARTMENT_OTHER): Payer: 59 | Admitting: Internal Medicine

## 2020-07-31 ENCOUNTER — Other Ambulatory Visit: Payer: Self-pay

## 2020-07-31 DIAGNOSIS — L97512 Non-pressure chronic ulcer of other part of right foot with fat layer exposed: Secondary | ICD-10-CM | POA: Diagnosis not present

## 2020-07-31 NOTE — Progress Notes (Signed)
[name] [name], [name] [name] ([id number]) Visit Report for 07/31/2020 HPI Details Patient Name: Date of Service: [name] [name], [name] [name] 07/31/2020 3:00 PM Medical Record Number: [id number] Patient Account Number: [id number] Date of Birth/Sex: Treating RN: 1966-04-16 (55 y.o. [name] [name], [name] [name] Primary Care Provider: Jenna [name] Other Clinician: Referring Provider: Treating Provider/Extender: [name] [name] in Treatment: 8 History of Present Illness HPI Description: ADMISSION 06/02/2020 This is a 55 year old woman who has had problems with wounds on her predominantly plantar right foot for quite a period of time. 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 More recently she has had an area developed in the right mid foot at roughly the third metatarsal head. She says this started as a blister of November. Its not been closing. More recently she has been using Silvadene cream she has a cam boot to offload. I note she walks with a cane The patient has had arterial studies done in 2019 at which time her ABI in the right was 1.28 with triphasic waveforms noncompressible on the left with triphasic waveform. Her ABI in our clinic today was 1.3 on the right. 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 She had a foot gastrocnemius recession sometime in the fall 2021 2/21; patient arrives today with a wound measuring slightly smaller 3 x 3 mm however there is circumferential area of erythema around this. She does not have any complaints 2/28; plantar wound on the right first transmetatarsal amputation site. Small oval-shaped wound raised skin around the edges. We have been using Hydrofera Blue. She does not have arterial issues 06/23/2020 upon evaluation today patient  appears to be doing decently well. With regard to her wound. There is little bit of slough and some hyper granulation. I do not feel like the PolyMem did too well for her. In fact she may do better with some Hydrofera Blue. I know we just switch from that but I feel like that may be a better way to continue currently. 3/15; this is a patient with a wound on the plantar right transmetatarsal amputation site probably the second metatarsal head not 1 July as stated previously we have been using polymen switch to [facility] last week. Wound looks about the same to me. We are going to start her on a total contact cast today we have the boot to fit 3/18; first total contact cast change she tolerated this well 3/31; she comes back in with out total contact cast on for 10 days. In spite of this the small wound over the third metatarsal head of her right transmetatarsal amputation site almost looks the same slightly hyper granulated. Circumference is not well adhered. We have been using Hydrofera Blue 4/7 she comes back in in her total contact cast. The wound is smaller epithelializing and looks like it on its way to closing. We have been using Hydrofera Blue over the wound area She has a right transmetatarsal amputation. She tells [place] that she has been to [facility] in the past and they did a modified shoe for her however it did not keep this area epithelialized. She is going to bring that in next week for [place] to look at. It does not sound however that she was really offloading or padding this area  at all 4/14; total contact cast. I changed her to calcium alginate today. I think most of the small area is epithelialized although there is clearly not complete epithelialization and even the area that is epithelialized and the small wound is very tiny She shows me her shoe with the insert. It looks as though the insert probably was too large for her transmet it foot allowing it to move back and  forward. I can see where the friction was in the blister she describes forming. I have asked her to take this back to [facility] to see if there is anything they can do Electronic Signature(s) Signed: 07/31/2020 5:23:41 PM By: Linton Ham MD Entered By: Linton Ham on 07/31/2020 17:12:50 -------------------------------------------------------------------------------- Physical Exam Details Patient Name: Date of Service: [name] Shutter D. 07/31/2020 3:00 PM Medical Record Number: [id number] Patient Account Number: [id number] Date of Birth/Sex: Treating RN: 11-05-1965 (55 y.o. Benjaman Lobe Primary Care Provider: Jenna [name] Other Clinician: Referring Provider: Treating Provider/Extender: [name] [name] in Treatment: 8 Constitutional Sitting or standing Blood Pressure is within target range for patient.. Pulse regular and within target range for patient.Marland Kitchen Respirations regular, non-labored and within target range.. Temperature is normal and within the target range for the patient.Marland Kitchen Appears in no distress. Notes Wound exam; generally healthy looking wound. I think most of this is epithelialized. There is clearly an area that is still open under illumination you can see the fluid but I am hopeful this is just about closed. The area that is epithelialized still looks too fragile to go into standard foot Electronic Signature(s) Signed: 07/31/2020 5:23:41 PM By: Linton Ham MD Entered By: Linton Ham on 07/31/2020 17:13:51 -------------------------------------------------------------------------------- Physician Orders Details Patient Name: Date of Service: [name] Shutter D. 07/31/2020 3:00 PM Medical Record Number: [id number] Patient Account Number: [id number] Date of Birth/Sex: Treating RN: [birth year] (55 y.o. [name] [name], [name] [name] Primary Care Provider: Jenna [name] Other Clinician: Referring Provider: Treating Provider/Extender:  [name] [name] in Treatment: 8 Verbal / Phone Orders: No Diagnosis Coding Follow-up Appointments Return Appointment in 1 week. Bathing/ Shower/ Hygiene May shower with protection but do not get wound dressing(s) wet. May shower and wash wound with soap and water. - on days that dressing is changed Off-Loading Total Contact Cast to Right Lower Extremity Wound Treatment Wound #1 - Amputation Site - Transmetatarsal Wound Laterality: Plantar, Right Cleanser: Soap and Water Every Other Day/15 Days Discharge Instructions: May shower and wash wound with dial antibacterial soap and water prior to dressing change. Prim Dressing: Maxorb Extra Calcium Alginate 2x2 in Every Other Day/15 Days ary Discharge Instructions: Apply calcium alginate to wound bed as instructed Secondary Dressing: Woven Gauze Sponges 2x2 in (Generic) Every Other Day/15 Days Discharge Instructions: Apply over primary dressing as directed. Secured With: [employer], 4.5x3.1 (in/yd) (Generic) Every Other Day/15 Days Discharge Instructions: Secure with Kerlix as directed. Secured With: Paper Tape, 2x10 (in/yd) (Generic) Every Other Day/15 Days Discharge Instructions: Secure dressing with tape as directed. Electronic Signature(s) Signed: 07/31/2020 5:23:41 PM By: Linton Ham MD Signed: 07/31/2020 5:45:48 PM By: Rhae Hammock RN Entered By: Rhae Hammock on 07/31/2020 16:27:47 -------------------------------------------------------------------------------- Problem List Details Patient Name: Date of Service: [name] Shutter D. 07/31/2020 3:00 PM Medical Record Number: [id number] Patient Account Number: [id number] Date of Birth/Sex: Treating RN: [birth year] (55 y.o. Benjaman Lobe Primary Care Provider: Jenna [name] Other Clinician: Referring Provider: Treating Provider/Extender: [name] [name] in Treatment: 8 Active Problems ICD-10 Encounter Code  Description Active Date MDM Diagnosis L97.512 Non-pressure chronic ulcer of other part of right foot with fat layer exposed 06/02/2020 No Yes G90.09 Other idiopathic peripheral autonomic neuropathy 06/02/2020 No Yes Inactive Problems ICD-10 Code Description Active Date Inactive Date L03.115 Cellulitis of right lower limb 06/09/2020 06/09/2020 Resolved Problems Electronic Signature(s) Signed: 07/31/2020 5:23:41 PM By: Linton Ham MD Entered By: Linton Ham on 07/31/2020 17:11:32 -------------------------------------------------------------------------------- Progress Note Details Patient Name: Date of Service: [name] Shutter D. 07/31/2020 3:00 PM Medical Record Number: [id number] Patient Account Number: [id number] Date of Birth/Sex: Treating RN: [birth year] (55 y.o. [name] [name], [name] [name] Primary Care Provider: Jenna [name] Other Clinician: Referring Provider: Treating Provider/Extender: [name] [name] in Treatment: 8 Subjective History of Present Illness (HPI) ADMISSION 06/02/2020 This is a 55 year old woman who has had problems with wounds on her predominantly plantar right foot for quite a period of time. 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 More recently she has had an area developed in the right mid foot at roughly the third metatarsal head. She says this started as a blister of November. Its not been closing. More recently she has been using Silvadene cream she has a cam boot to offload. I note she walks with a cane The patient has had arterial studies done in 2019 at which time her ABI in the right was 1.28 with triphasic waveforms noncompressible on the left with triphasic waveform. Her ABI in our clinic today was 1.3 on the right. Past medical history includes asthma, sleep apnea, prediabetic, she has had the right first transmetatarsal  amputation as well as a left fourth toe amputation. She had a foot gastrocnemius recession sometime in the fall 2021 2/21; patient arrives today with a wound measuring slightly smaller 3 x 3 mm however there is circumferential area of erythema around this. She does not have any complaints 2/28; plantar wound on the right first transmetatarsal amputation site. Small oval-shaped wound raised skin around the edges. We have been using Hydrofera Blue. She does not have arterial issues 06/23/2020 upon evaluation today patient appears to be doing decently well. With regard to her wound. There is little bit of slough and some hyper granulation. I do not feel like the PolyMem did too well for her. In fact she may do better with some Hydrofera Blue. I know we just switch from that but I feel like that may be a better way to continue currently. 3/15; this is a patient with a wound on the plantar right transmetatarsal amputation site probably the second metatarsal head not 1 July as stated previously we have been using polymen switch to [facility] last week. Wound looks about the same to me. We are going to start her on a total contact cast today we have the boot to fit 3/18; first total contact cast change she tolerated this well 3/31; she comes back in with out total contact cast on for 10 days. In spite of this the small wound over the third metatarsal head of her right transmetatarsal amputation site almost looks the same slightly hyper granulated. Circumference is not well adhered. We have been using Hydrofera Blue 4/7 she comes back in in her total contact cast. The wound is smaller epithelializing and looks like it on its way to closing. We have been using Hydrofera Blue over the wound area She has a right  transmetatarsal amputation. She tells [place] that she has been to [facility] in the past and they did a modified shoe for her however it did not keep this area epithelialized. She is going to  bring that in next week for [place] to look at. It does not sound however that she was really offloading or padding this area at all 4/14; total contact cast. I changed her to calcium alginate today. I think most of the small area is epithelialized although there is clearly not complete epithelialization and even the area that is epithelialized and the small wound is very tiny She shows me her shoe with the insert. It looks as though the insert probably was too large for her transmet it foot allowing it to move back and forward. I can see where the friction was in the blister she describes forming. I have asked her to take this back to [facility] to see if there is anything they can do Objective Constitutional Sitting or standing Blood Pressure is within target range for patient.. Pulse regular and within target range for patient.Marland Kitchen Respirations regular, non-labored and within target range.. Temperature is normal and within the target range for the patient.Marland Kitchen Appears in no distress. Vitals Time Taken: 3:48 PM, Height: 64 in, Weight: 228 lbs, BMI: 39.1, Temperature: 98.4 F, Pulse: 83 bpm, Respiratory Rate: 16 breaths/min, Blood Pressure: 116/79 mmHg. General Notes: Wound exam; generally healthy looking wound. I think most of this is epithelialized. There is clearly an area that is still open under illumination you can see the fluid but I am hopeful this is just about closed. The area that is epithelialized still looks too fragile to go into standard foot Integumentary (Hair, Skin) Wound #1 status is Open. Original cause of wound was Gradually Appeared. The date acquired was: 03/05/2020. The wound has been in treatment 8 weeks. The wound is located on the Right,Plantar Amputation Site - Transmetatarsal. The wound measures 0.4cm length x 0.3cm width x 0.1cm depth; 0.094cm^2 area and 0.009cm^3 volume. There is Fat Layer (Subcutaneous Tissue) exposed. There is no tunneling or undermining noted.  There is a medium amount of serosanguineous drainage noted. The wound margin is distinct with the outline attached to the wound base. There is large (67-100%) pink granulation within the wound bed. There is no necrotic tissue within the wound bed. Assessment Active Problems ICD-10 Non-pressure chronic ulcer of other part of right foot with fat layer exposed Other idiopathic peripheral autonomic neuropathy Procedures Wound #1 Pre-procedure diagnosis of Wound #1 is a Neuropathic Ulcer-Non Diabetic located on the Right,Plantar Amputation Site - Transmetatarsal . There was a Total Contact Cast Procedure by Ricard Dillon., MD. Post procedure Diagnosis Wound #1: Same as Pre-Procedure Plan Follow-up Appointments: Return Appointment in 1 week. Bathing/ Shower/ Hygiene: May shower with protection but do not get wound dressing(s) wet. May shower and wash wound with soap and water. - on days that dressing is changed Off-Loading: T Contact Cast to Right Lower Extremity otal WOUND #1: - Amputation Site - Transmetatarsal Wound Laterality: Plantar, Right Cleanser: Soap and Water Every Other Day/15 Days Discharge Instructions: May shower and wash wound with dial antibacterial soap and water prior to dressing change. Prim Dressing: Maxorb Extra Calcium Alginate 2x2 in Every Other Day/15 Days ary Discharge Instructions: Apply calcium alginate to wound bed as instructed Secondary Dressing: Woven Gauze Sponges 2x2 in (Generic) Every Other Day/15 Days Discharge Instructions: Apply over primary dressing as directed. Secured With: [employer],  4.5x3.1 (in/yd) (Generic) Every Other Day/15 Days Discharge Instructions: Secure with Kerlix as directed. Secured With: Paper T ape, 2x10 (in/yd) (Generic) Every Other Day/15 Days Discharge Instructions: Secure dressing with tape as directed. 1. I changed her to calcium alginate to see if we can dry this out 2 still the total contact cast I am hopeful  what I am looking at was viable epithelium under illumination. 3. She may be healed by next week. 4. She showed me her shoes with the modified insert to accommodate her transmet. I do not think that this was the proper size and allowed her foot to remove back and forth in the shoe. I am not exactly sure how we are going to deal with this if she is healed by next week. I have asked her to bring the insert back to [facility] to see if there is anything they can do to modify Electronic Signature(s) Signed: 07/31/2020 5:23:41 PM By: Linton Ham MD Entered By: Linton Ham on 07/31/2020 17:15:03 -------------------------------------------------------------------------------- Total Contact Cast Details Patient Name: Date of Service: [name] Shutter D. 07/31/2020 3:00 PM Medical Record Number: [id number] Patient Account Number: [id number] Date of Birth/Sex: Treating RN: 1965-10-13 (55 y.o. [name] [name], [name] [name] Primary Care Provider: Jenna [name] Other Clinician: Referring Provider: Treating Provider/Extender: [name] [name] in Treatment: 8 T Contact Cast Applied for Wound Assessment: otal Wound #1 Right,Plantar Amputation Site - Transmetatarsal Performed By: Physician Ricard Dillon., MD Post Procedure Diagnosis Same as Pre-procedure Electronic Signature(s) Signed: 07/31/2020 5:23:41 PM By: Linton Ham MD Entered By: Linton Ham on 07/31/2020 17:11:45 -------------------------------------------------------------------------------- SuperBill Details Patient Name: Date of Service: [name] 07/31/2020 Medical Record Number: [id number] Patient Account Number: [id number] Date of Birth/Sex: Treating RN: 12-17-1965 (55 y.o. [name] [name], [name] [name] Primary Care Provider: Jenna [name] Other Clinician: Referring Provider: Treating Provider/Extender: [name] [name] in Treatment: 8 Diagnosis Coding ICD-10 Codes Code  Description [phone] Non-pressure chronic ulcer of other part of right foot with fat layer exposed G90.09 Other idiopathic peripheral autonomic neuropathy Facility Procedures CPT4 Code: [id number] Description: [phone] - APPLY TOTAL CONTACT LEG CAST ICD-10 Diagnosis Description L97.512 Non-pressure chronic ulcer of other part of right foot with fat layer exposed Modifier: Quantity: 1 Physician Procedures : CPT4 Code Description Modifier [id number] - WC PHYS APPLY TOTAL CONTACT CAST ICD-10 Diagnosis Description L97.512 Non-pressure chronic ulcer of other part of right foot with fat layer exposed Quantity: 1 Electronic Signature(s) Signed: 07/31/2020 5:23:41 PM By: Linton Ham MD Entered By: Linton Ham on 07/31/2020 17:15:23

## 2020-08-05 NOTE — Progress Notes (Signed)
[name], [name] ([id number]) Visit Report for 07/31/2020 Arrival Information Details Patient Name: Date of Service: [name] [name], [name] [name] 07/31/2020 3:00 PM Medical Record Number: [id number] Patient Account Number: [id number] Date of Birth/Sex: Treating RN: 02-10-1966 (55 y.o. Tonita Phoenix, Lauren Primary Care Xiomar Crompton: Jenna Luo Other Clinician: Referring Jack Bolio: Treating Avie Checo/Extender: Rosezena Sensor in Treatment: 8 Visit Information History Since Last Visit Added or deleted any medications: No Patient Arrived: [name] Any new allergies or adverse reactions: No Arrival Time: 15:48 Had a fall or experienced change in No Accompanied By: self activities of daily living that may affect Transfer Assistance: None risk of falls: Patient Identification Verified: Yes Signs or symptoms of abuse/neglect since last visito No Secondary Verification Process Completed: Yes Hospitalized since last visit: No Patient Requires Transmission-Based Precautions: No Implantable device outside of the clinic excluding No Patient Has Alerts: No cellular tissue based products placed in the center since last visit: Has Dressing in Place as Prescribed: Yes Pain Present Now: No Electronic Signature(s) Signed: 07/31/2020 4:03:05 PM By: Sandre Kitty Entered By: Sandre Kitty on 07/31/2020 15:48:23 -------------------------------------------------------------------------------- Encounter Discharge Information Details Patient Name: Date of Service: [name] Shutter D. 07/31/2020 3:00 PM Medical Record Number: [id number] Patient Account Number: [id number] Date of Birth/Sex: Treating RN: 1965/07/13 (55 y.o. [name] [name] Primary Care Niurka Benecke: Jenna Luo Other Clinician: Referring Megan Hayduk: Treating Brigett Estell/Extender: Rosezena Sensor in Treatment: 8 Encounter Discharge Information Items Discharge Condition: Stable Ambulatory Status:  Cane Discharge Destination: Home Transportation: Private Auto Accompanied By: alone Schedule Follow-up Appointment: Yes Clinical Summary of Care: Patient Declined Electronic Signature(s) Signed: 07/31/2020 6:17:09 PM By: Levan Hurst RN, BSN Entered By: Levan Hurst on 07/31/2020 18:10:15 -------------------------------------------------------------------------------- Lower Extremity Assessment Details Patient Name: Date of Service: [name] Shutter D. 07/31/2020 3:00 PM Medical Record Number: [id number] Patient Account Number: [id number] Date of Birth/Sex: Treating RN: 14-Sep-1965 (55 y.o. [name] [name] Primary Care Patrcia Schnepp: Jenna Luo Other Clinician: Referring Shary Lamos: Treating Adalaya Irion/Extender: Rosezena Sensor in Treatment: 8 Edema Assessment Assessed: Shirlyn Goltz: No] Patrice Paradise: No] Edema: [Left: N] [Right: o] Calf Left: Right: Point of Measurement: 34 cm From Medial Instep 34 cm Ankle Left: Right: Point of Measurement: 9 cm From Medial Instep 23 cm Vascular Assessment Pulses: Dorsalis Pedis Palpable: [Right:Yes] Electronic Signature(s) Signed: 07/31/2020 6:17:09 PM By: Levan Hurst RN, BSN Entered By: Levan Hurst on 07/31/2020 16:16:49 -------------------------------------------------------------------------------- Multi Wound Chart Details Patient Name: Date of Service: [name] Shutter D. 07/31/2020 3:00 PM Medical Record Number: [id number] Patient Account Number: [id number] Date of Birth/Sex: Treating RN: 08-Mar-1966 (55 y.o. 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] [N/A:N/A] Primary Etiology: [1:Asthma, Sleep Apnea, Hypertension, N/A] Comorbid History: [1:Osteoarthritis, Neuropathy, Seizure Disorder 03/05/2020] [N/A:N/A] Date Acquired: [1:8] [N/A:N/A] Weeks of Treatment: [1:Open] [N/A:N/A] Wound Status: [1:0.4x0.3x0.1] [N/A:N/A] Measurements L x W x D (cm) [1:0.094] [N/A:N/A] A (cm) : rea [1:0.009] [N/A:N/A] Volume (cm) : [1:88.00%] [N/A:N/A] % Reduction in Area: [1:96.20%] [N/A:N/A] % Reduction in Volume: [1:Full Thickness Without Exposed] [N/A:N/A] Classification: [1:Support Structures Medium] [N/A:N/A] Exudate Amount: [1:Serosanguineous] [N/A:N/A] Exudate Type: [1:red, brown] [N/A:N/A] Exudate Color: [1:Distinct, outline attached] [N/A:N/A] Wound Margin: [1:Large (67-100%)] [N/A:N/A] Granulation Amount: [1:Pink] [N/A:N/A] Granulation Quality: [1:None Present (0%)] [N/A:N/A] Necrotic  Amount: [1:Fat Layer (Subcutaneous Tissue): Yes N/A] Exposed Structures: [1:Fascia: No Tendon: No Muscle: No Joint: No Bone: No Medium (34-66%)] [N/A:N/A] Epithelialization: [1:T Contact Cast otal] [N/A:N/A] Treatment Notes Electronic Signature(s) Signed: 07/31/2020 5:23:41 PM By: Linton Ham MD Signed: 07/31/2020 5:45:48 PM By: Rhae Hammock RN Entered By: Linton Ham on 07/31/2020 17:11:37 -------------------------------------------------------------------------------- Multi-Disciplinary Care Plan Details Patient Name: Date of Service: [name] Shutter D. 07/31/2020 3:00 PM Medical Record Number: [id number] Patient Account Number: [id number] Date of Birth/Sex: Treating RN: [birth year] (55 y.o. Tonita Phoenix, Lauren Primary Care Chloe Miyoshi: Jenna Luo Other Clinician: Referring Taisa Deloria: Treating Esmay Amspacher/Extender: Rosezena Sensor in Treatment: 8 Multidisciplinary Care Plan reviewed with  physician Active Inactive Wound/Skin Impairment Nursing Diagnoses: Impaired tissue integrity Knowledge deficit related to ulceration/compromised skin integrity Goals: Patient/caregiver will verbalize understanding of skin care regimen Date Initiated: 06/02/2020 Target Resolution Date: 09/12/2020 Goal Status: Active Ulcer/skin breakdown will have a volume reduction of 30% by week 4 Date Initiated: 06/02/2020 Date Inactivated: 07/17/2020 Target Resolution Date: 07/04/2020 Unmet Reason: no major change see Goal Status: Unmet measurements. Interventions: Assess patient/caregiver ability to obtain necessary supplies Assess patient/caregiver ability to perform ulcer/skin care regimen upon admission and as needed Assess ulceration(s) every visit Provide education on ulcer and skin care Notes: Electronic Signature(s) Signed: 07/31/2020 5:45:48 PM By: Rhae Hammock RN Entered By: Rhae Hammock on 07/31/2020 16:28:04 -------------------------------------------------------------------------------- Pain Assessment Details Patient Name: Date of Service: [name] Shutter D. 07/31/2020 3:00 PM Medical Record Number: [id number] Patient Account Number: [id number] Date of Birth/Sex: Treating RN: November 26, 1965 (55 y.o. Tonita Phoenix, Lauren Primary Care Katheleen Stella: Jenna Luo Other Clinician: Referring Maxi Carreras: Treating Glory Graefe/Extender: Rosezena Sensor in Treatment: 8 Active Problems Location of Pain Severity and Description of Pain Patient Has Paino No Site Locations Pain Management and Medication Current Pain Management: Electronic Signature(s) Signed: 07/31/2020 4:03:05 PM By: Sandre Kitty Signed: 07/31/2020 5:45:48 PM By: Rhae Hammock RN Entered By: Sandre Kitty on 07/31/2020 15:48:57 -------------------------------------------------------------------------------- Patient/Caregiver Education Details Patient Name: Date of Service: Berkemeier,  Lavena D. 4/14/2022andnbsp3:00 PM Medical Record Number: [id number] Patient Account Number: [id number] Date of Birth/Gender: Treating RN: December 13, 1965 (55 y.o. Benjaman Lobe Primary Care Physician: Jenna Luo Other Clinician: Referring Physician: Treating Physician/Extender: Rosezena Sensor in Treatment: 8 Education Assessment Education Provided To: Patient Education Topics Provided Wound/Skin Impairment: Methods: Explain/Verbal Responses: State content correctly Electronic Signature(s) Signed: 07/31/2020 5:45:48 PM By: Rhae Hammock RN Entered By: Rhae Hammock on 07/31/2020 16:28:18 -------------------------------------------------------------------------------- Wound Assessment Details Patient Name: Date of Service: [name] Shutter D. 07/31/2020 3:00 PM Medical Record Number: [id number] Patient Account Number: [id number] Date of Birth/Sex: Treating RN: 1965/05/19 (55 y.o. 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, Plantar Amputation Site - Transmetatarsal Wound Open Wounding Event: Gradually Appeared Status: Date Acquired: 03/05/2020 Comorbid Asthma, Sleep Apnea, Hypertension, Osteoarthritis, Weeks Of Treatment: 8 History: Neuropathy, Seizure Disorder Clustered Wound: No Photos Wound Measurements Length: (cm) 0.4 Width: (cm) 0.3 Depth: (cm) 0.1 Area: (cm) 0.094 Volume: (cm) 0.009 % Reduction in Area: 88% % Reduction in Volume: 96.2% Epithelialization: Medium (34-66%) Tunneling: No Undermining: No Wound Description Classification: Full Thickness Without Exposed Support Structures Wound Margin: Distinct, outline attached Exudate Amount: Medium Exudate Type: Serosanguineous Exudate  Color: red, brown Foul Odor After Cleansing: No Slough/Fibrino No Wound Bed Granulation Amount: Large (67-100%) Exposed Structure Granulation Quality: Pink Fascia Exposed: No Necrotic  Amount: None Present (0%) Fat Layer (Subcutaneous Tissue) Exposed: Yes Tendon Exposed: No Muscle Exposed: No Joint Exposed: No Bone Exposed: No Treatment Notes Wound #1 (Amputation Site - Transmetatarsal) Wound Laterality: Plantar, Right Cleanser Soap and Water Discharge Instruction: May shower and wash wound with dial antibacterial soap and water prior to dressing change. Peri-Wound Care Topical Primary Dressing Maxorb Extra Calcium Alginate 2x2 in Discharge Instruction: Apply calcium alginate to wound bed as instructed Secondary Dressing Woven Gauze Sponges 2x2 in Discharge Instruction: Apply over primary dressing as directed. Secured With [employer], 4.5x3.1 (in/yd) Discharge Instruction: Secure with Kerlix as directed. Paper Tape, 2x10 (in/yd) Discharge Instruction: Secure dressing with tape as directed. Compression Wrap Compression Stockings Add-Ons Electronic Signature(s) Signed: 07/31/2020 5:45:48 PM By: Rhae Hammock RN Signed: 08/05/2020 8:07:32 AM By: Sandre Kitty Previous Signature: 07/31/2020 4:03:05 PM Version By: Sandre Kitty Entered By: Sandre Kitty on 07/31/2020 16:44:51 -------------------------------------------------------------------------------- Vitals Details Patient Name: Date of Service: [name] Shutter D. 07/31/2020 3:00 PM Medical Record Number: [id number] Patient Account Number: [id number] Date of Birth/Sex: Treating RN: [birth year] (55 y.o. Tonita Phoenix, Lauren Primary Care Carneshia Raker: Jenna Luo Other Clinician: Referring Jerrell Hart: Treating Leyanna Bittman/Extender: Rosezena Sensor in Treatment: 8 Vital Signs Time Taken: 15:48 Temperature (F): 98.4 Height (in): 64 Pulse (bpm): 83 Weight (lbs): 228 Respiratory  Rate (breaths/min): 16 Body Mass Index (BMI): 39.1 Blood Pressure (mmHg): 116/79 Reference Range: 80 - 120 mg / dl Electronic Signature(s) Signed: 07/31/2020 4:03:05 PM By: Sandre Kitty Entered By: Sandre Kitty on 07/31/2020 15:48:44

## 2020-08-07 ENCOUNTER — Other Ambulatory Visit: Payer: Self-pay

## 2020-08-07 ENCOUNTER — Encounter (HOSPITAL_BASED_OUTPATIENT_CLINIC_OR_DEPARTMENT_OTHER): Payer: 59 | Admitting: Internal Medicine

## 2020-08-07 DIAGNOSIS — L97512 Non-pressure chronic ulcer of other part of right foot with fat layer exposed: Secondary | ICD-10-CM | POA: Diagnosis not present

## 2020-08-07 NOTE — Progress Notes (Signed)
[name] [name] ([id number]) Visit Report for 08/07/2020 Chief Complaint Document Details Patient Name: Date of Service: [name] [name], [name] [name] 08/07/2020 2:45 PM Medical Record Number: [id number] Patient Account Number: [id number] Date of Birth/Sex: Treating RN: [birth year] (55 y.o. [name] Primary Care Provider: Jenna Luo Other Clinician: Referring Provider: Treating Provider/Extender: Michel Bickers in Treatment: 9 Information Obtained from: Patient Chief Complaint 06/03/2019; patient is here for review of a wound on her plantar foot transmetatarsal amputation site at roughly the third met head Electronic Signature(s) Signed: 08/07/2020 5:23:10 PM By: Kalman Shan DO Entered By: Kalman Shan on 08/07/2020 17:15:58 -------------------------------------------------------------------------------- HPI Details Patient Name: Date of Service: [name] Shutter D. 08/07/2020 2:45 PM Medical Record Number: [id number] Patient Account Number: [id number] Date of Birth/Sex: Treating RN: 08-21-1965 (55 y.o. [name] Primary Care Provider: Jenna Luo Other Clinician: Referring Provider: Treating Provider/Extender: Michel Bickers in Treatment: 9 History of Present Illness HPI Description: ADMISSION 06/02/2020 This is a 55 year old woman who has had problems with wounds on her predominantly plantar right foot for quite a period of time. 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 More recently she has had an area developed in the right mid foot at roughly the third metatarsal head. She says this started as a blister of November. Its not been closing. More recently she has been using Silvadene cream she has a cam boot to offload. I note she walks with a cane The patient has had arterial studies done in 2019 at which  time her ABI in the right was 1.28 with triphasic waveforms noncompressible on the left with triphasic waveform. Her ABI in our clinic today was 1.3 on the right. 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 She had a foot gastrocnemius recession sometime in the fall 2021 2/21; patient arrives today with a wound measuring slightly smaller 3 x 3 mm however there is circumferential area of erythema around this. She does not have any complaints 2/28; plantar wound on the right first transmetatarsal amputation site. Small oval-shaped wound raised skin around the edges. We have been using Hydrofera Blue. She does not have arterial issues 06/23/2020 upon evaluation today patient appears to be doing decently well. With regard to her wound. There is little bit of slough and some hyper granulation. I do not feel like the PolyMem did too well for her. In fact she may do better with some Hydrofera Blue. I know we just switch from that but I feel like that may be a better way to continue currently. 3/15; this is a patient with a wound on the plantar right transmetatarsal amputation site probably the second metatarsal head not 1 July as stated previously we have been using polymen switch to [facility] last week. Wound looks about the same to me. We are going to start her on a total contact cast today we have the boot to fit 3/18; first total contact cast change she tolerated this well 3/31; she comes back in with out total contact cast on for 10 days. In spite of this the small wound over the third metatarsal head of her right transmetatarsal amputation site almost looks the same slightly hyper granulated. Circumference is not well adhered. We have been using Hydrofera Blue  4/7 she comes back in in her total contact cast. The wound is smaller epithelializing and looks like it on its way to  closing. We have been using Hydrofera Blue over the wound area She has a right transmetatarsal amputation. She tells [place] that she has been to [facility] in the past and they did a modified shoe for her however it did not keep this area epithelialized. She is going to bring that in next week for [place] to look at. It does not sound however that she was really offloading or padding this area at all 4/14; total contact cast. I changed her to calcium alginate today. I think most of the small area is epithelialized although there is clearly not complete epithelialization and even the area that is epithelialized and the small wound is very tiny She shows me her shoe with the insert. It looks as though the insert probably was too large for her transmet it foot allowing it to move back and forward. I can see where the friction was in the blister she describes forming. I have asked her to take this back to [facility] to see if there is anything they can do 4/21; patient presents for her 1 week follow-up. She has been treated with a total contact cast weekly. She has no complaints today. She denies any fever/chills. Electronic Signature(s) Signed: 08/07/2020 5:23:10 PM By: Kalman Shan DO Entered By: Kalman Shan on 08/07/2020 17:17:36 -------------------------------------------------------------------------------- Physical Exam Details Patient Name: Date of Service: [name] [name] 08/07/2020 2:45 PM Medical Record Number: [id number] Patient Account Number: [id number] Date of Birth/Sex: Treating RN: Jan 04, 1966 (55 y.o. [name] Primary Care Provider: Jenna Luo Other Clinician: Referring Provider: Treating Provider/Extender: Michel Bickers in Treatment: 9 Constitutional respirations regular, non-labored and within target range for patient.Marland Kitchen Psychiatric pleasant and cooperative. Notes Right foot: Patient's wound appears almost closed.  Granulation tissue appears healthy. No signs of infection Electronic Signature(s) Signed: 08/07/2020 5:23:10 PM By: Kalman Shan DO Entered By: Kalman Shan on 08/07/2020 17:19:47 -------------------------------------------------------------------------------- Physician Orders Details Patient Name: Date of Service: [name] [name] 08/07/2020 2:45 PM Medical Record Number: [id number] Patient Account Number: [id number] Date of Birth/Sex: Treating RN: [birth year] (55 y.o. [name] Primary Care Provider: Jenna Luo Other Clinician: Referring Provider: Treating Provider/Extender: Michel Bickers in Treatment: 9 Verbal / Phone Orders: No Diagnosis Coding ICD-10 Coding Code Description [id number] Non-pressure chronic ulcer of other part of right foot with fat layer exposed G90.09 Other idiopathic peripheral autonomic neuropathy Follow-up Appointments Return Appointment in 1 week. Bathing/ Shower/ Hygiene May shower with protection but do not get wound dressing(s) wet. May shower and wash wound with soap and water. - on days that dressing is changed Off-Loading Total Contact Cast to Right Lower Extremity Wound Treatment Wound #1 - Amputation Site - Transmetatarsal Wound Laterality: Plantar, Right Cleanser: Soap and Water Every Other Day/15 Days Discharge Instructions: May shower and wash wound with dial antibacterial soap and water prior to dressing change. Prim Dressing: Maxorb Extra Calcium Alginate 2x2 in Every Other Day/15 Days ary Discharge Instructions: Apply calcium alginate to wound bed as instructed Secondary Dressing: Woven Gauze Sponges 2x2 in (Generic) Every Other Day/15 Days Discharge Instructions: Apply over primary dressing as directed. Secured With: [employer], 4.5x3.1 (in/yd) (Generic) Every Other Day/15 Days Discharge Instructions: Secure with Kerlix as directed. Secured With: Paper Tape, 2x10 (in/yd) (Generic) Every  Other Day/15 Days Discharge Instructions: Secure dressing with tape  as directed. Electronic Signature(s) Signed: 08/07/2020 5:23:10 PM By: Kalman Shan DO Entered By: Kalman Shan on 08/07/2020 17:20:03 -------------------------------------------------------------------------------- Problem List Details Patient Name: Date of Service: [name] [name] 08/07/2020 2:45 PM Medical Record Number: [id number] Patient Account Number: [id number] Date of Birth/Sex: Treating RN: 03-30-1966 (55 y.o. Helene Shoe, Tammi Klippel Primary Care Provider: Jenna Luo Other Clinician: Referring Provider: Treating Provider/Extender: Michel Bickers in Treatment: 9 Active Problems ICD-10 Encounter Code Description Active Date MDM Diagnosis L97.512 Non-pressure chronic ulcer of other part of right foot with fat layer exposed 06/02/2020 No Yes G90.09 Other idiopathic peripheral autonomic neuropathy 06/02/2020 No Yes Inactive Problems ICD-10 Code Description Active Date Inactive Date L03.115 Cellulitis of right lower limb 06/09/2020 06/09/2020 Resolved Problems Electronic Signature(s) Signed: 08/07/2020 5:23:10 PM By: Kalman Shan DO Signed: 08/07/2020 5:23:10 PM By: Kalman Shan DO Entered By: Kalman Shan on 08/07/2020 17:15:45 -------------------------------------------------------------------------------- Progress Note Details Patient Name: Date of Service: [name] Shutter D. 08/07/2020 2:45 PM Medical Record Number: [id number] Patient Account Number: [id number] Date of Birth/Sex: Treating RN: 07/18/1965 (55 y.o. [name] Primary Care Provider: Jenna Luo Other Clinician: Referring Provider: Treating Provider/Extender: Michel Bickers in Treatment: 9 Subjective Chief Complaint Information obtained from Patient 06/03/2019; patient is here for review of a wound on her plantar foot transmetatarsal amputation site at roughly the  third met head History of Present Illness (HPI) ADMISSION 06/02/2020 This is a 55 year old woman who has had problems with wounds on her predominantly plantar right foot for quite a period of time. 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 More recently she has had an area developed in the right mid foot at roughly the third metatarsal head. She says this started as a blister of November. Its not been closing. More recently she has been using Silvadene cream she has a cam boot to offload. I note she walks with a cane The patient has had arterial studies done in 2019 at which time her ABI in the right was 1.28 with triphasic waveforms noncompressible on the left with triphasic waveform. Her ABI in our clinic today was 1.3 on the right. 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 She had a foot gastrocnemius recession sometime in the fall 2021 2/21; patient arrives today with a wound measuring slightly smaller 3 x 3 mm however there is circumferential area of erythema around this. She does not have any complaints 2/28; plantar wound on the right first transmetatarsal amputation site. Small oval-shaped wound raised skin around the edges. We have been using Hydrofera Blue. She does not have arterial issues 06/23/2020 upon evaluation today patient appears to be doing decently well. With regard to her wound. There is little bit of slough and some hyper granulation. I do not feel like the PolyMem did too well for her. In fact she may do better with some Hydrofera Blue. I know we just switch from that but I feel like that may be a better way to continue currently. 3/15; this is a patient with a wound on the plantar right transmetatarsal amputation site probably the second metatarsal  head not 1 July as stated previously we have been using polymen switch to [facility] last week. Wound looks about the same to me. We are  going to start her on a total contact cast today we have the boot to fit 3/18; first total contact cast change she tolerated this well 3/31; she comes back in with out total contact cast on for 10 days. In spite of this the small wound over the third metatarsal head of her right transmetatarsal amputation site almost looks the same slightly hyper granulated. Circumference is not well adhered. We have been using Hydrofera Blue 4/7 she comes back in in her total contact cast. The wound is smaller epithelializing and looks like it on its way to closing. We have been using Hydrofera Blue over the wound area She has a right transmetatarsal amputation. She tells [place] that she has been to [facility] in the past and they did a modified shoe for her however it did not keep this area epithelialized. She is going to bring that in next week for [place] to look at. It does not sound however that she was really offloading or padding this area at all 4/14; total contact cast. I changed her to calcium alginate today. I think most of the small area is epithelialized although there is clearly not complete epithelialization and even the area that is epithelialized and the small wound is very tiny She shows me her shoe with the insert. It looks as though the insert probably was too large for her transmet it foot allowing it to move back and forward. I can see where the friction was in the blister she describes forming. I have asked her to take this back to [facility] to see if there is anything they can do 4/21; patient presents for her 1 week follow-up. She has been treated with a total contact cast weekly. She has no complaints today. She denies any fever/chills. Patient History Information obtained from Patient. Family History Cancer - Paternal Grandparents,  Diabetes - Mother,Maternal Grandparents, Heart Disease - Mother,Paternal Grandparents,Maternal Grandparents,Father,Siblings, Hypertension - Mother,Father, Lung Disease - Mother, No family history of Hereditary Spherocytosis, Kidney Disease, Seizures, Stroke, Thyroid Problems, Tuberculosis. Social History Never smoker, Marital Status - Widowed, Alcohol Use - Never, Drug Use - Prior History, Caffeine Use - Daily - soda's. Medical History Eyes Denies history of Cataracts, Glaucoma, Optic Neuritis Ear/Nose/Mouth/Throat Denies history of Chronic sinus problems/congestion, Middle ear problems Hematologic/Lymphatic Denies history of Anemia, Hemophilia, Human Immunodeficiency Virus, Lymphedema, Sickle Cell Disease Respiratory Patient has history of Asthma, Sleep Apnea Denies history of Aspiration, Chronic Obstructive Pulmonary Disease (COPD), Pneumothorax, Tuberculosis Cardiovascular Patient has history of Hypertension Denies history of Angina, Arrhythmia, Congestive Heart Failure, Coronary Artery Disease, Deep Vein Thrombosis, Hypotension, Myocardial Infarction, Peripheral Arterial Disease, Peripheral Venous Disease, Phlebitis, Vasculitis Gastrointestinal Denies history of Cirrhosis , Colitis, Crohnoos, Hepatitis A, Hepatitis B, Hepatitis C Endocrine Denies history of Type I Diabetes, Type II Diabetes Genitourinary Denies history of End Stage Renal Disease Immunological Denies history of Lupus Erythematosus, Raynaudoos, Scleroderma Integumentary (Skin) Denies history of History of Burn Musculoskeletal Patient has history of Osteoarthritis Denies history of Gout, Rheumatoid Arthritis, Osteomyelitis Neurologic Patient has history of Neuropathy, Seizure Disorder - hx of seizures r/t tramadol Denies history of Dementia, Quadriplegia, Paraplegia Oncologic Denies history of Received Chemotherapy, Received Radiation Psychiatric Denies history of Anorexia/bulimia, Confinement  Anxiety Hospitalization/Surgery History - trasmet amp right foot. - left 4th toe amp. - neck surgery. - spine surgery. - abdom. hysterectomy. - appendectomy. - lap. knee surgery. Medical A Surgical History Notes nd Gastrointestinal Hx of GERD Psychiatric hx of bipolar 1 disorder, anxiety,  depression. Objective Constitutional respirations regular, non-labored and within target range for patient.. Vitals Time Taken: 2:50 PM, Height: 64 in, Weight: 228 lbs, BMI: 39.1, Temperature: 97.7 F, Pulse: 89 bpm, Respiratory Rate: 18 breaths/min, Blood Pressure: 149/80 mmHg. Psychiatric pleasant and cooperative. General Notes: Right foot: Patient's wound appears almost closed. Granulation tissue appears healthy. No signs of infection Integumentary (Hair, Skin) Wound #1 status is Open. Original cause of wound was Gradually Appeared. The date acquired was: 03/05/2020. The wound has been in treatment 9 weeks. The wound is located on the Right,Plantar Amputation Site - Transmetatarsal. The wound measures 0.2cm length x 0.2cm width x 0.1cm depth; 0.031cm^2 area and 0.003cm^3 volume. There is Fat Layer (Subcutaneous Tissue) exposed. There is no tunneling or undermining noted. There is a small amount of serosanguineous drainage noted. The wound margin is distinct with the outline attached to the wound base. There is large (67-100%) pink granulation within the wound bed. There is no necrotic tissue within the wound bed. 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 It is almost closed. I am hopeful she will only need 2 to 3-more cast changes. Procedures Wound #1 Pre-procedure diagnosis of Wound #1 is a Neuropathic Ulcer-Non Diabetic located on the Right,Plantar Amputation Site - Transmetatarsal . There was a Total Contact Cast  Procedure by Kalman Shan, DO. Post procedure Diagnosis Wound #1: Same as Pre-Procedure Plan Follow-up Appointments: Return Appointment in 1 week. Bathing/ Shower/ Hygiene: May shower with protection but do not get wound dressing(s) wet. May shower and wash wound with soap and water. - on days that dressing is changed Off-Loading: T Contact Cast to Right Lower Extremity otal WOUND #1: - Amputation Site - Transmetatarsal Wound Laterality: Plantar, Right Cleanser: Soap and Water Every Other Day/15 Days Discharge Instructions: May shower and wash wound with dial antibacterial soap and water prior to dressing change. Prim Dressing: Maxorb Extra Calcium Alginate 2x2 in Every Other Day/15 Days ary Discharge Instructions: Apply calcium alginate to wound bed as instructed Secondary Dressing: Woven Gauze Sponges 2x2 in (Generic) Every Other Day/15 Days Discharge Instructions: Apply over primary dressing as directed. Secured With: [employer], 4.5x3.1 (in/yd) (Generic) Every Other Day/15 Days Discharge Instructions: Secure with Kerlix as directed. Secured With: Paper T ape, 2x10 (in/yd) (Generic) Every Other Day/15 Days Discharge Instructions: Secure dressing with tape as directed. 1. T contact cast changed today otal 2. Calcium alginate 3. Follow-up in 1 week Electronic Signature(s) Signed: 08/07/2020 5:23:10 PM By: Kalman Shan DO Entered By: Kalman Shan on 08/07/2020 17:21:19 -------------------------------------------------------------------------------- HxROS Details Patient Name: Date of Service: [name] Shutter D. 08/07/2020 2:45 PM Medical Record Number: [id number] Patient Account Number: [id number] Date of Birth/Sex: Treating RN: 12/03/1965 (55 y.o. [name] Primary Care Provider: Jenna Luo Other Clinician: Referring Provider: Treating Provider/Extender: Michel Bickers in Treatment: 9 Information Obtained  From Patient Eyes Medical History: Negative for: Cataracts; Glaucoma; Optic Neuritis Ear/Nose/Mouth/Throat Medical History: Negative for: Chronic sinus problems/congestion; Middle ear problems Hematologic/Lymphatic Medical History: Negative for: Anemia; Hemophilia; Human Immunodeficiency Virus; Lymphedema; Sickle Cell Disease Respiratory Medical History: Positive for: Asthma; Sleep Apnea Negative for: Aspiration; Chronic Obstructive Pulmonary Disease (COPD); Pneumothorax; Tuberculosis Cardiovascular Medical History: Positive for: Hypertension Negative for: Angina; Arrhythmia; Congestive Heart Failure; Coronary Artery Disease; Deep Vein Thrombosis; Hypotension; Myocardial Infarction; Peripheral Arterial Disease; Peripheral Venous Disease; Phlebitis; Vasculitis Gastrointestinal Medical History: Negative for: Cirrhosis ;  Colitis; Crohns; Hepatitis A; Hepatitis B; Hepatitis C Past Medical History Notes: Hx of GERD Endocrine Medical History: Negative for: Type I Diabetes; Type II Diabetes Genitourinary Medical History: Negative for: End Stage Renal Disease Immunological Medical History: Negative for: Lupus Erythematosus; Raynauds; Scleroderma Integumentary (Skin) Medical History: Negative for: History of Burn Musculoskeletal Medical History: Positive for: Osteoarthritis Negative for: Gout; Rheumatoid Arthritis; Osteomyelitis Neurologic Medical History: Positive for: Neuropathy; Seizure Disorder - hx of seizures r/t tramadol Negative for: Dementia; Quadriplegia; Paraplegia Oncologic Medical History: Negative for: Received Chemotherapy; Received Radiation Psychiatric Medical History: Negative for: Anorexia/bulimia; Confinement Anxiety Past Medical History Notes: hx of bipolar 1 disorder, anxiety, depression. Immunizations Pneumococcal Vaccine: Received Pneumococcal Vaccination: Yes Immunization Notes: pt. doesn't remember last tetanus shot Implantable  Devices None Hospitalization / Surgery History Type of Hospitalization/Surgery trasmet amp right foot left 4th toe amp neck surgery spine surgery abdom. hysterectomy appendectomy lap. knee surgery Family and Social History Cancer: Yes - Paternal Grandparents; Diabetes: Yes - Mother,Maternal Grandparents; Heart Disease: Yes - Mother,Paternal Grandparents,Maternal Grandparents,Father,Siblings; Hereditary Spherocytosis: No; Hypertension: Yes - Mother,Father; Kidney Disease: No; Lung Disease: Yes - Mother; Seizures: No; Stroke: No; Thyroid Problems: No; Tuberculosis: No; Never smoker; Marital Status - Widowed; Alcohol Use: Never; Drug Use: Prior History; Caffeine Use: Daily - soda's; Financial Concerns: No; Food, Clothing or Shelter Needs: No; Support System Lacking: No; Transportation Concerns: No Electronic Signature(s) Signed: 08/07/2020 5:23:10 PM By: Kalman Shan DO Signed: 08/07/2020 5:27:07 PM By: Deon Pilling Entered By: Kalman Shan on 08/07/2020 17:18:15 -------------------------------------------------------------------------------- Total Contact Cast Details Patient Name: Date of Service: [name] [name] 08/07/2020 2:45 PM Medical Record Number: [id number] Patient Account Number: [id number] Date of Birth/Sex: Treating RN: April 24, 1965 (55 y.o. [name] Primary Care Provider: Jenna Luo Other Clinician: Referring Provider: Treating Provider/Extender: Michel Bickers in Treatment: 9 T Contact Cast Applied for Wound Assessment: otal Wound #1 Right,Plantar Amputation Site - Transmetatarsal Performed By: Physician Kalman Shan, DO Post Procedure Diagnosis Same as Pre-procedure Electronic Signature(s) Signed: 08/07/2020 5:23:10 PM By: Kalman Shan DO Signed: 08/07/2020 5:27:07 PM By: Deon Pilling Entered By: Deon Pilling on 08/07/2020  15:25:39 -------------------------------------------------------------------------------- SuperBill Details Patient Name: Date of Service: [name] [name] 08/07/2020 Medical Record Number: [id number] Patient Account Number: [id number] Date of Birth/Sex: Treating RN: 07/27/1965 (55 y.o. [name] Primary Care Provider: Jenna Luo Other Clinician: Referring Provider: Treating Provider/Extender: Michel Bickers in Treatment: 9 Diagnosis Coding ICD-10 Codes Code Description [phone] Non-pressure chronic ulcer of other part of right foot with fat layer exposed G90.09 Other idiopathic peripheral autonomic neuropathy Facility Procedures Electronic Signature(s) Signed: 08/07/2020 5:23:10 PM By: Kalman Shan DO Entered By: Kalman Shan on 08/07/2020 17:22:35

## 2020-08-12 ENCOUNTER — Encounter (HOSPITAL_BASED_OUTPATIENT_CLINIC_OR_DEPARTMENT_OTHER): Payer: 59 | Admitting: Internal Medicine

## 2020-08-12 ENCOUNTER — Other Ambulatory Visit: Payer: Self-pay

## 2020-08-12 DIAGNOSIS — L97512 Non-pressure chronic ulcer of other part of right foot with fat layer exposed: Secondary | ICD-10-CM | POA: Diagnosis not present

## 2020-08-12 DIAGNOSIS — G9009 Other idiopathic peripheral autonomic neuropathy: Secondary | ICD-10-CM | POA: Diagnosis not present

## 2020-08-12 NOTE — Progress Notes (Signed)
[name] [name], [name] [name] ([id number]) Visit Report for 08/12/2020 Arrival Information Details Patient Name: Date of Service: [name] [name], [name] [name] 08/12/2020 2:00 PM Medical Record Number: [id number] Patient Account Number: [id number] Date of Birth/Sex: Treating RN: [birth year] (55 y.o. [name] [name], [name] [name] Primary Care Brett Darko: Jenna Luo Other Clinician: Referring Crytal Pensinger: Treating Bridget /Extender: Michel Bickers in Treatment: 10 Visit Information History Since Last Visit Added or deleted any medications: No Patient Arrived: [name] [name] Any new allergies or adverse reactions: No Arrival Time: 14:10 Had a fall or experienced change in No Accompanied By: self activities of daily living that may affect Transfer Assistance: None risk of falls: Patient Identification Verified: Yes Signs or symptoms of abuse/neglect since last visito No Secondary Verification Process Completed: Yes Hospitalized since last visit: No Patient Requires Transmission-Based Precautions: No Implantable device outside of the clinic excluding No Patient Has Alerts: No cellular tissue based products placed in the center since last visit: Has Dressing in Place as Prescribed: Yes Pain Present Now: No Electronic Signature(s) Signed: 08/12/2020 5:14:41 PM By: Sandre Kitty Entered By: Sandre Kitty on 08/12/2020 14:11:19 -------------------------------------------------------------------------------- Encounter Discharge Information Details Patient Name: Date of Service: [name] Shutter D. 08/12/2020 2:00 PM Medical Record Number: [id number] Patient Account Number: [id number] Date of Birth/Sex: Treating RN: 06-14-1965 (55 y.o. [name] [name] Primary Care Dafna Romo: Jenna Luo Other Clinician: Referring Shatisha Falter: Treating Ronalee Scheunemann/Extender: Michel Bickers in Treatment: 10 Encounter Discharge Information Items Discharge Condition: Stable Ambulatory Status:  Cane Discharge Destination: Home Transportation: Private Auto Accompanied By: self Schedule Follow-up Appointment: Yes Clinical Summary of Care: Electronic Signature(s) Signed: 08/12/2020 6:08:37 PM By: Deon Pilling Entered By: Deon Pilling on 08/12/2020 16:47:08 -------------------------------------------------------------------------------- Lower Extremity Assessment Details Patient Name: Date of Service: [name] [name] 08/12/2020 2:00 PM Medical Record Number: [id number] Patient Account Number: [id number] Date of Birth/Sex: Treating RN: 27-Jan-1966 (55 y.o. [name] [name] Primary Care Korbyn Chopin: Jenna Luo Other Clinician: Referring Lyzbeth Genrich: Treating Sache Sane/Extender: Michel Bickers in Treatment: 10 Edema Assessment Assessed: [name] [name]: No] [Right: Yes] Edema: [Left: N] [Right: o] Calf Left: Right: Point of Measurement: 34 cm From Medial Instep 34 cm Ankle Left: Right: Point of Measurement: 9 cm From Medial Instep 23 cm Vascular Assessment Pulses: Dorsalis Pedis Palpable: [Right:Yes] Electronic Signature(s) Signed: 08/12/2020 6:08:37 PM By: Deon Pilling Entered By: Deon Pilling on 08/12/2020 14:38:29 -------------------------------------------------------------------------------- Multi Wound Chart Details Patient Name: Date of Service: [name] Shutter D. 08/12/2020 2:00 PM Medical Record Number: [id number] Patient Account Number: [id number] Date of Birth/Sex: Treating RN: 07/11/1965 (55 y.o. 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] [N/A:N/A] Primary Etiology: [1:Asthma, Sleep Apnea, Hypertension,] [N/A:N/A] Comorbid History: [1:Osteoarthritis, Neuropathy, Seizure Disorder 03/05/2020] [N/A:N/A] Date Acquired: [1:10] [N/A:N/A] Weeks of Treatment: [1:Open] [N/A:N/A] Wound Status: [1:0.1x0.1x0.1] [N/A:N/A] Measurements L x W x D (cm) [1:0.008] [N/A:N/A] A (cm) : rea [1:0.001] [N/A:N/A] Volume (cm) : [1:99.00%] [N/A:N/A] % Reduction in Area: [1:99.60%] [N/A:N/A] % Reduction in Volume: [1:Full Thickness Without Exposed] [N/A:N/A] Classification: [1:Support Structures Small] [N/A:N/A] Exudate Amount: [1:Serosanguineous] [N/A:N/A] Exudate Type: [1:red, brown] [N/A:N/A] Exudate Color: [1:Distinct, outline attached] [N/A:N/A] Wound Margin: [1:Large (67-100%)] [N/A:N/A] Granulation Amount: [1:Pink] [N/A:N/A] Granulation Quality: [1:None Present (0%)] [N/A:N/A] Necrotic Amount: [1:Fat Layer (Subcutaneous  Tissue): Yes N/A] Exposed Structures: [1:Fascia: No Tendon: No Muscle: No Joint: No Bone: No Large (67-100%)] [N/A:N/A] Epithelialization: [1:T Contact Cast otal] [N/A:N/A] Treatment Notes Electronic Signature(s) Signed: 08/12/2020 3:34:37 PM By: Kalman Shan DO Signed: 08/12/2020 6:28:10 PM By: Baruch Gouty RN, BSN Entered By: Kalman Shan on 08/12/2020 15:30:00 -------------------------------------------------------------------------------- Multi-Disciplinary Care Plan Details Patient Name: Date of Service: [name] Shutter D. 08/12/2020 2:00 PM Medical Record Number: [id number] Patient Account Number: [id number] Date of Birth/Sex: Treating RN: [birth year] (55 y.o. [name] [name] Primary Care Kizzie Cotten: Jenna Luo Other Clinician: Referring Ladell Lea: Treating Earline Stiner/Extender: Michel Bickers in Treatment: [name] [name] reviewed with physician Active  Inactive Wound/Skin Impairment Nursing Diagnoses: Impaired tissue integrity Knowledge deficit related to ulceration/compromised skin integrity Goals: Patient/caregiver will verbalize understanding of skin care regimen Date Initiated: 06/02/2020 Target Resolution Date: 09/12/2020 Goal Status: Active Ulcer/skin breakdown will have a volume reduction of 30% by week 4 Date Initiated: 06/02/2020 Date Inactivated: 07/17/2020 Target Resolution Date: 07/04/2020 Unmet Reason: no major change see Goal Status: Unmet measurements. Interventions: Assess patient/caregiver ability to obtain necessary supplies Assess patient/caregiver ability to perform ulcer/skin care regimen upon admission and as needed Assess ulceration(s) every visit Provide education on ulcer and skin care Notes: Electronic Signature(s) Signed: 08/12/2020 6:28:10 PM By: Baruch Gouty RN, BSN Entered By: Baruch Gouty on 08/12/2020 14:53:12 -------------------------------------------------------------------------------- Pain Assessment Details Patient Name: Date of Service: [name] Shutter D. 08/12/2020 2:00 PM Medical Record Number: [id number] Patient Account Number: [id number] Date of Birth/Sex: Treating RN: February 28, 1966 (54 y.o. [name] [name] Primary Care Crist Kruszka: Jenna Luo Other Clinician: Referring Egidio Lofgren: Treating Caius Silbernagel/Extender: Michel Bickers in Treatment: 10 Active Problems Location of Pain Severity and Description of Pain Patient Has Paino No Site Locations Pain Management and Medication Current Pain Management: Electronic Signature(s) Signed: 08/12/2020 5:14:41 PM By: Sandre Kitty Signed: 08/12/2020 6:28:10 PM By: Baruch Gouty RN, BSN Entered By: Sandre Kitty on 08/12/2020 14:11:46 -------------------------------------------------------------------------------- Patient/Caregiver Education Details Patient Name: Date of Service: [name] [name]  4/26/2022andnbsp2:00 PM Medical Record Number: [id number] Patient Account Number: [id number] Date of Birth/Gender: Treating RN: 06/12/1965 (55 y.o. [name] [name] Primary Care Physician: Jenna Luo Other Clinician: Referring Physician: Treating Physician/Extender: Michel Bickers in Treatment: 10 Education Assessment Education Provided To: Patient Education Topics Provided Offloading: Methods: Explain/Verbal Responses: Reinforcements needed, State content correctly Wound/Skin Impairment: Methods: Explain/Verbal Responses: Reinforcements needed, State content correctly Electronic Signature(s) Signed: 08/12/2020 6:28:10 PM By: Baruch Gouty RN, BSN Entered By: Baruch Gouty on 08/12/2020 14:53:35 -------------------------------------------------------------------------------- Wound Assessment Details Patient Name: Date of Service: [name] Shutter D. 08/12/2020 2:00 PM Medical Record Number: [id number] Patient Account Number: [id number] Date of Birth/Sex: Treating RN: 07-Jun-1965 (55 y.o. 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, Plantar Amputation Site - Transmetatarsal Wound Open Wounding Event: Gradually Appeared Status: Date Acquired: 03/05/2020 Comorbid Asthma, Sleep Apnea, Hypertension, Osteoarthritis, Weeks Of Treatment: 10 History: Neuropathy, Seizure Disorder Clustered Wound: No Photos Wound Measurements Length: (cm) 0.1 Width: (cm) 0.1 Depth: (cm) 0.1 Area: (cm) 0.008 Volume: (cm) 0.001 % Reduction in Area: 99% % Reduction in Volume: 99.6% Epithelialization: Large (67-100%) Tunneling: No Undermining: No Wound Description Classification: Full Thickness Without Exposed Support  Structures Wound Margin: Distinct, outline attached Exudate Amount: Small Exudate Type: Serosanguineous Exudate Color: red, brown Foul Odor After Cleansing: No Slough/Fibrino No Wound Bed Granulation Amount:  Large (67-100%) Exposed Structure Granulation Quality: Pink Fascia Exposed: No Necrotic Amount: None Present (0%) Fat Layer (Subcutaneous Tissue) Exposed: Yes Tendon Exposed: No Muscle Exposed: No Joint Exposed: No Bone Exposed: No Treatment Notes Wound #1 (Amputation Site - Transmetatarsal) Wound Laterality: Plantar, Right Cleanser Soap and Water Discharge Instruction: May shower and wash wound with dial antibacterial soap and water prior to dressing change. Peri-Wound Care Topical Primary Dressing Maxorb Extra Calcium Alginate 2x2 in Discharge Instruction: Apply calcium alginate to wound bed as instructed Secondary Dressing Woven Gauze Sponges 2x2 in Discharge Instruction: Apply over primary dressing as directed. Secured With 40M Medipore H Soft Cloth Surgical T 4 x 2 (in/yd) ape Discharge Instruction: Secure dressing with tape as directed. Compression Wrap Compression Stockings Add-Ons Electronic Signature(s) Signed: 08/12/2020 5:14:41 PM By: Sandre Kitty Signed: 08/12/2020 6:28:10 PM By: Baruch Gouty RN, BSN Entered By: Sandre Kitty on 08/12/2020 17:07:37 -------------------------------------------------------------------------------- Vitals Details Patient Name: Date of Service: [name] Shutter D. 08/12/2020 2:00 PM Medical Record Number: [id number] Patient Account Number: [id number] Date of Birth/Sex: Treating RN: 08/16/1965 (55 y.o. [name] [name] Primary Care Heather Mckendree: Jenna Luo Other Clinician: Referring Raquon Milledge: Treating Jamel Dunton/Extender: Michel Bickers in Treatment: 10 Vital Signs Time Taken: 14:11 Temperature (F): 98.6 Height (in): 64 Pulse (bpm): 80 Weight (lbs): 228 Respiratory Rate  (breaths/min): 18 Body Mass Index (BMI): 39.1 Blood Pressure (mmHg): 124/83 Reference Range: 80 - 120 mg / dl Electronic Signature(s) Signed: 08/12/2020 5:14:41 PM By: Sandre Kitty Entered By: Sandre Kitty on 08/12/2020 14:11:34

## 2020-08-12 NOTE — Progress Notes (Signed)
[name] [name], [name] [name] ([id number]) Visit Report for 08/07/2020 Arrival Information Details Patient Name: Date of Service: [name] [name], [name] [name] 08/07/2020 2:45 PM Medical Record Number: [id number] Patient Account Number: [id number] Date of Birth/Sex: Treating RN: 05-24-1965 (55 y.o. [name] Primary Care Antwian Santaana: Jenna Luo Other Clinician: Referring Syair Fricker: Treating Manas Hickling/Extender: Michel Bickers in Treatment: 9 Visit Information History Since Last Visit Added or deleted any medications: No Patient Arrived: [name] [name] Any new allergies or adverse reactions: No Arrival Time: 14:46 Had a fall or experienced change in No Transfer Assistance: None activities of daily living that may affect Patient Identification Verified: Yes risk of falls: Secondary Verification Process Completed: Yes Signs or symptoms of abuse/neglect since last visito No Patient Requires Transmission-Based Precautions: No Hospitalized since last visit: No Patient Has Alerts: No Implantable device outside of the clinic excluding No cellular tissue based products placed in the center since last visit: Has Dressing in Place as Prescribed: Yes Has Footwear/Offloading in Place as Prescribed: Yes Right: T Contact Cast otal Pain Present Now: No Electronic Signature(s) Signed: 08/07/2020 5:08:10 PM By: Lorrin Jackson Entered By: Lorrin Jackson on 08/07/2020 14:50:23 -------------------------------------------------------------------------------- Lower Extremity Assessment Details Patient Name: Date of Service: [name] [name] 08/07/2020 2:45 PM Medical Record Number: [id number] Patient Account Number: [id number] Date of Birth/Sex: Treating RN: [birth year] (55 y.o. [name] Primary Care Laiyah Exline: Jenna Luo Other Clinician: Referring Mainor Hellmann: Treating Majesty Oehlert/Extender: Michel Bickers in Treatment: 9 Edema Assessment Assessed: Shirlyn Goltz: No]  [Right: Yes] Edema: [Left: N] [Right: o] Calf Left: Right: Point of Measurement: 34 cm From Medial Instep 33.8 cm Ankle Left: Right: Point of Measurement: 9 cm From Medial Instep 23 cm Vascular Assessment Pulses: Dorsalis Pedis Palpable: [Right:Yes] Electronic Signature(s) Signed: 08/07/2020 5:08:10 PM By: Lorrin Jackson Entered By: Lorrin Jackson on 08/07/2020 15:02:10 -------------------------------------------------------------------------------- Multi Wound Chart Details Patient Name: Date of Service: [name] Shutter D. 08/07/2020 2:45 PM Medical Record Number: [id number] Patient Account Number: [id number] Date of Birth/Sex: Treating RN: 06-Apr-1966 (55 y.o. [name] Primary Care Keana Dueitt: Jenna Luo Other Clinician: Referring Jolette Lana: Treating Marisol Glazer/Extender: Michel Bickers in Treatment: 9 Vital Signs Height(in): 64 Pulse(bpm): 19 Weight(lbs): 228 Blood Pressure(mmHg): 149/80 Body Mass Index(BMI): 39 Temperature(F): 97.7 Respiratory Rate(breaths/min): 18 Photos: [N/A:N/A] Right, Plantar Amputation Site - N/A N/A Wound Location: Transmetatarsal Gradually Appeared N/A N/A Wounding Event: Neuropathic Ulcer-Non Diabetic N/A N/A Primary Etiology: Asthma, Sleep Apnea, Hypertension, N/A N/A Comorbid History: Osteoarthritis, Neuropathy, Seizure Disorder 03/05/2020 N/A N/A Date Acquired: 9 N/A N/A Weeks of Treatment: Open N/A N/A Wound Status: 0.2x0.2x0.1 N/A N/A Measurements L x W x D (cm) 0.031 N/A N/A A (cm) : rea 0.003 N/A N/A Volume (cm) : 96.10% N/A N/A % Reduction in Area: 98.70% N/A N/A % Reduction in Volume: Full Thickness Without Exposed N/A N/A Classification: Support Structures Small N/A N/A Exudate Amount: Serosanguineous N/A N/A Exudate Type: red, brown N/A N/A Exudate Color: Distinct, outline attached N/A N/A Wound Margin: Large (67-100%) N/A N/A Granulation Amount: Pink N/A  N/A Granulation Quality: None Present (0%) N/A N/A Necrotic Amount: Fat Layer (Subcutaneous Tissue): Yes N/A N/A Exposed Structures: Fascia: No Tendon: No Muscle: No Joint: No Bone: No Large (67-100%) N/A N/A Epithelialization: T Contact Cast otal N/A N/A Procedures Performed: Treatment Notes Electronic Signature(s) Signed: 08/07/2020 5:23:10 PM By: Kalman Shan DO Signed: 08/07/2020 5:27:07 PM By: Deon Pilling Entered By: Kalman Shan on 08/07/2020 17:15:50 -------------------------------------------------------------------------------- Multi-Disciplinary Care Plan Details Patient Name: Date of Service: [name] Shutter D. 08/07/2020 2:45  PM Medical Record Number: [id number] Patient Account Number: [id number] Date of Birth/Sex: Treating RN: 1965-05-26 (55 y.o. [name] Primary Care Darnita Woodrum: Jenna Luo Other Clinician: Referring Anaysia Germer: Treating Mahati Vajda/Extender: Michel Bickers in Treatment: Harts reviewed with physician Active Inactive Wound/Skin Impairment Nursing Diagnoses: Impaired tissue integrity Knowledge deficit related to ulceration/compromised skin integrity Goals: Patient/caregiver will verbalize understanding of skin care regimen Date Initiated: 06/02/2020 Target Resolution Date: 09/12/2020 Goal Status: Active Ulcer/skin breakdown will have a volume reduction of 30% by week 4 Date Initiated: 06/02/2020 Date Inactivated: 07/17/2020 Target Resolution Date: 07/04/2020 Unmet Reason: no major change see Goal Status: Unmet measurements. Interventions: Assess patient/caregiver ability to obtain necessary supplies Assess patient/caregiver ability to perform ulcer/skin care regimen upon admission and as needed Assess ulceration(s) every visit Provide education on ulcer and skin care Notes: Electronic Signature(s) Signed: 08/07/2020 5:27:07 PM By: Deon Pilling Entered By: Deon Pilling on  08/07/2020 15:22:57 -------------------------------------------------------------------------------- Pain Assessment Details Patient Name: Date of Service: [name] [name], [name] [name] 08/07/2020 2:45 PM Medical Record Number: [id number] Patient Account Number: [id number] Date of Birth/Sex: Treating RN: 12-15-1965 (55 y.o. [name] Primary Care Magdalynn Davilla: Jenna Luo Other Clinician: Referring Clancy Mullarkey: Treating Crystin Lechtenberg/Extender: Michel Bickers in Treatment: 9 Active Problems Location of Pain Severity and Description of Pain Patient Has Paino No Site Locations Pain Management and Medication Current Pain Management: Electronic Signature(s) Signed: 08/07/2020 5:08:10 PM By: Lorrin Jackson Entered By: Lorrin Jackson on 08/07/2020 14:58:27 -------------------------------------------------------------------------------- Patient/Caregiver Education Details Patient Name: Date of Service: [name] [name] 4/21/2022andnbsp2:45 PM Medical Record Number: [id number] Patient Account Number: [id number] Date of Birth/Gender: Treating RN: [birth year] (55 y.o. [name] Primary Care Physician: Jenna Luo Other Clinician: Referring Physician: Treating Physician/Extender: Michel Bickers in Treatment: 9 Education Assessment Education Provided To: Patient Education Topics Provided Wound/Skin Impairment: Handouts: Skin Care Do's and Dont's Methods: Explain/Verbal Responses: Reinforcements needed Electronic Signature(s) Signed: 08/07/2020 5:27:07 PM By: Deon Pilling Entered By: Deon Pilling on 08/07/2020 15:23:09 -------------------------------------------------------------------------------- Wound Assessment Details Patient Name: Date of Service: [name] [name] 08/07/2020 2:45 PM Medical Record Number: [id number] Patient Account Number: [id number] Date of Birth/Sex: Treating RN: 04/17/1966 (55 y.o. 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, Plantar Amputation Site - Transmetatarsal Wound Open Wounding Event: Gradually Appeared Status: Date Acquired: 03/05/2020 Comorbid Asthma, Sleep Apnea, Hypertension, Osteoarthritis, Weeks Of Treatment: 9 History: Neuropathy, Seizure Disorder Clustered Wound: No Photos Wound Measurements Length: (cm) 0.2 Width: (cm) 0.2 Depth: (cm) 0.1 Area: (cm) 0.031 Volume: (cm) 0.003 % Reduction in Area: 96.1% % Reduction in Volume: 98.7% Epithelialization: Large (67-100%) Tunneling: No Undermining: No Wound Description Classification: Full Thickness Without Exposed Support Structures Wound Margin: Distinct, outline attached Exudate Amount: Small Exudate Type: Serosanguineous Exudate Color: red, brown Foul Odor After Cleansing: No Slough/Fibrino No Wound Bed Granulation Amount: Large (67-100%) Exposed Structure Granulation Quality: Pink Fascia Exposed: No Necrotic Amount: None Present (0%) Fat Layer (Subcutaneous Tissue) Exposed: Yes Tendon Exposed: No Muscle Exposed: No Joint Exposed: No Bone Exposed: No Electronic Signature(s) Signed: 08/07/2020 5:08:10 PM By: Lorrin Jackson Signed: 08/12/2020 2:09:33 PM By: Sandre Kitty Entered By: Sandre Kitty on 08/07/2020 17:01:17 -------------------------------------------------------------------------------- [place] Details Patient Name: Date of Service: [name] Shutter D. 08/07/2020 2:45 PM Medical Record Number: [id number] Patient Account Number: [id number] Date of Birth/Sex: Treating RN: 06/26/1965 (56 y.o. F)  Lorrin Jackson Primary Care Makira Holleman: Jenna Luo Other Clinician: Referring Stephan Draughn: Treating Dolce Sylvia/Extender: Michel Bickers in Treatment: 9 Vital Signs Time Taken: 14:50 Temperature (F): 97.7 Height (in): 64 Pulse (bpm): 89 Weight (lbs): 228 Respiratory Rate (breaths/min): 18 Body Mass Index (BMI): 39.1 Blood Pressure (mmHg): 149/80 Reference Range: 80 - 120 mg / dl Electronic Signature(s) Signed: 08/07/2020 5:08:10 PM By: Lorrin Jackson Entered By: Lorrin Jackson on 08/07/2020 14:50:46

## 2020-08-12 NOTE — Progress Notes (Signed)
[name] [name] ([id number]) Visit Report for 08/12/2020 Chief Complaint Document Details Patient Name: Date of Service: [name] [name], [name] [name] 08/12/2020 2:00 PM Medical Record Number: [id number] Patient Account Number: [id number] Date of Birth/Sex: Treating RN: [birth year] (55 y.o. [name] Primary Care Provider: Jenna Luo Other Clinician: Referring Provider: Treating Provider/Extender: Michel Bickers in Treatment: 10 Information Obtained from: Patient Chief Complaint 06/03/2019; patient is here for review of a wound on her plantar foot transmetatarsal amputation site at roughly the third met head Electronic Signature(s) Signed: 08/12/2020 3:34:37 PM By: Kalman Shan DO Entered By: Kalman Shan on 08/12/2020 15:30:09 -------------------------------------------------------------------------------- HPI Details Patient Name: Date of Service: [name] Shutter D. 08/12/2020 2:00 PM Medical Record Number: [id number] Patient Account Number: [id number] Date of Birth/Sex: Treating RN: 10-Dec-1965 (55 y.o. [name] Primary Care Provider: Jenna Luo Other Clinician: Referring Provider: Treating Provider/Extender: Michel Bickers in Treatment: 10 History of Present Illness HPI Description: ADMISSION 06/02/2020 This is a 55 year old woman who has had problems with wounds on her predominantly plantar right foot for quite a period of time. 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 More recently she has had an area developed in the right mid foot at roughly the third metatarsal head. She says this started as a blister of November. Its not been closing. More recently she has been using Silvadene cream she has a cam boot to offload. I note she walks with a cane The patient has had arterial studies done in 2019 at  which time her ABI in the right was 1.28 with triphasic waveforms noncompressible on the left with triphasic waveform. Her ABI in our clinic today was 1.3 on the right. 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 She had a foot gastrocnemius recession sometime in the fall 2021 2/21; patient arrives today with a wound measuring slightly smaller 3 x 3 mm however there is circumferential area of erythema around this. She does not have any complaints 2/28; plantar wound on the right first transmetatarsal amputation site. Small oval-shaped wound raised skin around the edges. We have been using Hydrofera Blue. She does not have arterial issues 06/23/2020 upon evaluation today patient appears to be doing decently well. With regard to her wound. There is little bit of slough and some hyper granulation. I do not feel like the PolyMem did too well for her. In fact she may do better with some Hydrofera Blue. I know we just switch from that but I feel like that may be a better way to continue currently. 3/15; this is a patient with a wound on the plantar right transmetatarsal amputation site probably the second metatarsal head not 1 July as stated previously we have been using polymen switch to [facility] last week. Wound looks about the same to me. We are going to start her on a total contact cast today we have the boot to fit 3/18; first total contact cast change she tolerated this well 3/31; she comes back in with out total contact cast on for 10 days. In spite of this the small wound over the third metatarsal head of her right transmetatarsal amputation site almost looks the same slightly hyper granulated. Circumference is not well adhered. We have been using Hydrofera Blue  4/7 she comes back in in her total contact cast. The wound is smaller epithelializing and looks like it on its way to  closing. We have been using Hydrofera Blue over the wound area She has a right transmetatarsal amputation. She tells [place] that she has been to [facility] in the past and they did a modified shoe for her however it did not keep this area epithelialized. She is going to bring that in next week for [place] to look at. It does not sound however that she was really offloading or padding this area at all 4/14; total contact cast. I changed her to calcium alginate today. I think most of the small area is epithelialized although there is clearly not complete epithelialization and even the area that is epithelialized and the small wound is very tiny She shows me her shoe with the insert. It looks as though the insert probably was too large for her transmet it foot allowing it to move back and forward. I can see where the friction was in the blister she describes forming. I have asked her to take this back to [facility] to see if there is anything they can do 4/21; patient presents for her 1 week follow-up. She has been treated with a total contact cast weekly. She has no complaints today. She denies any fever/chills. 4/26; patient presents for 1 week follow-up in total contact cast change. Patient is having knee pain to her right knee due to chronic osteoarthritis. She is hoping to be able to wear her brace over the cast. Electronic Signature(s) Signed: 08/12/2020 3:34:37 PM By: Kalman Shan DO Entered By: Kalman Shan on 08/12/2020 15:31:28 -------------------------------------------------------------------------------- Physical Exam Details Patient Name: Date of Service: [name] Shutter D. 08/12/2020 2:00 PM Medical Record Number: [id number] Patient Account Number: [id number] Date of Birth/Sex: Treating RN: 09-May-1965 (55 y.o. [name] Primary Care Provider: Jenna Luo Other Clinician: Referring Provider: Treating Provider/Extender: Michel Bickers in Treatment: 10 Constitutional respirations regular, non-labored and within target range for patient.Marland Kitchen Psychiatric pleasant and cooperative. Notes Right foot: Patient's wound appears almost closed. Granulation tissue appears healthy. No signs of infection Electronic Signature(s) Signed: 08/12/2020 3:34:37 PM By: Kalman Shan DO Entered By: Kalman Shan on 08/12/2020 15:32:10 -------------------------------------------------------------------------------- Physician Orders Details Patient Name: Date of Service: [name] Shutter D. 08/12/2020 2:00 PM Medical Record Number: [id number] Patient Account Number: [id number] Date of Birth/Sex: Treating RN: 1966-02-06 (55 y.o. [name] Primary Care Provider: Jenna Luo Other Clinician: Referring Provider: Treating Provider/Extender: Michel Bickers in Treatment: 10 Verbal / Phone Orders: No Diagnosis Coding ICD-10 Coding Code Description [phone] Non-pressure chronic ulcer of other part of right foot with fat layer exposed G90.09 Other idiopathic peripheral autonomic neuropathy Follow-up Appointments ppointment in 1 week. - with Dr. Dellia Nims Return A Bathing/ Shower/ Hygiene May shower with protection but do not get wound dressing(s) wet. Off-Loading Total Contact Cast to Right Lower Extremity Wound Treatment Wound #1 - Amputation Site - Transmetatarsal Wound Laterality: Plantar, Right Cleanser: Soap and Water 1 x Per Week/15 Days Discharge Instructions: May shower and wash wound with dial antibacterial soap and water prior to dressing change. Prim Dressing: Maxorb Extra Calcium Alginate 2x2 in 1 x Per Week/15 Days ary Discharge Instructions: Apply calcium alginate to wound bed as instructed Secondary Dressing: Woven Gauze Sponges 2x2 in (Generic) 1 x Per Week/15 Days Discharge Instructions: Apply over primary dressing as directed. Secured With: [facility] T  4 x 2 (in/yd) 1 x Per Week/15 Days ape Discharge Instructions: Secure dressing with tape as directed. Electronic Signature(s) Signed: 08/12/2020 3:34:37 PM By: Kalman Shan DO Entered By: Kalman Shan on 08/12/2020 15:32:39 -------------------------------------------------------------------------------- Problem List Details Patient Name: Date of Service: [name] Shutter D. 08/12/2020 2:00 PM Medical Record Number: [id number] Patient Account Number: [id number] Date of Birth/Sex: Treating RN: [birth year] (56 y.o. [name] Primary Care Provider: Jenna Luo Other Clinician: Referring Provider: Treating Provider/Extender: Michel Bickers in Treatment: 10 Active Problems ICD-10 Encounter Code Description Active Date MDM Diagnosis L97.512 Non-pressure chronic ulcer of other part of right foot with fat layer exposed 06/02/2020 No Yes G90.09 Other idiopathic peripheral autonomic neuropathy 06/02/2020 No Yes Inactive Problems ICD-10 Code Description Active Date Inactive Date L03.115 Cellulitis of right lower limb 06/09/2020 06/09/2020 Resolved Problems Electronic Signature(s) Signed: 08/12/2020 3:34:37 PM By: Kalman Shan DO Entered By: Kalman Shan on 08/12/2020 15:29:55 -------------------------------------------------------------------------------- Progress Note Details Patient Name: Date of Service: [name] Shutter D. 08/12/2020 2:00 PM Medical Record Number: [id number] Patient Account Number: [id number] Date of Birth/Sex: Treating RN: October 30, 1965 (55 y.o. [name] Primary Care Provider: Jenna Luo Other Clinician: Referring Provider: Treating Provider/Extender: Michel Bickers in Treatment: 10 Subjective Chief Complaint Information obtained from Patient 06/03/2019; patient is here for review of a wound on her plantar foot transmetatarsal amputation site at roughly the third met head History of  Present Illness (HPI) ADMISSION 06/02/2020 This is a 55 year old woman who has had problems with wounds on her predominantly plantar right foot for quite a period of time. 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 More recently she has had an area developed in the right mid foot at roughly the third metatarsal head. She says this started as a blister of November. Its not been closing. More recently she has been using Silvadene cream she has a cam boot to offload. I note she walks with a cane The patient has had arterial studies done in 2019 at which time her ABI in the right was 1.28 with triphasic waveforms noncompressible on the left with triphasic waveform. Her ABI in our clinic today was 1.3 on the right. 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 She had a foot gastrocnemius recession sometime in the fall 2021 2/21; patient arrives today with a wound measuring slightly smaller 3 x 3 mm however there is circumferential area of erythema around this. She does not have any complaints 2/28; plantar wound on the right first transmetatarsal amputation site. Small oval-shaped wound raised skin around the edges. We have been using Hydrofera Blue. She does not have arterial issues 06/23/2020 upon evaluation today patient appears to be doing decently well. With regard to her wound. There is little bit of slough and some hyper granulation. I do not feel like the PolyMem did too well for her. In fact she may do better with some Hydrofera Blue. I know we just switch from that but I feel like that may be a better way to continue currently. 3/15; this is a patient with a wound on the plantar right transmetatarsal amputation site probably the second metatarsal head not 1 July as stated  previously we have been using polymen switch to [facility]  last week. Wound looks about the same to me. We are going to start her on a total contact cast today we have the boot to fit 3/18; first total contact cast change she tolerated this well 3/31; she comes back in with out total contact cast on for 10 days. In spite of this the small wound over the third metatarsal head of her right transmetatarsal amputation site almost looks the same slightly hyper granulated. Circumference is not well adhered. We have been using Hydrofera Blue 4/7 she comes back in in her total contact cast. The wound is smaller epithelializing and looks like it on its way to closing. We have been using Hydrofera Blue over the wound area She has a right transmetatarsal amputation. She tells [place] that she has been to [facility] in the past and they did a modified shoe for her however it did not keep this area epithelialized. She is going to bring that in next week for [place] to look at. It does not sound however that she was really offloading or padding this area at all 4/14; total contact cast. I changed her to calcium alginate today. I think most of the small area is epithelialized although there is clearly not complete epithelialization and even the area that is epithelialized and the small wound is very tiny She shows me her shoe with the insert. It looks as though the insert probably was too large for her transmet it foot allowing it to move back and forward. I can see where the friction was in the blister she describes forming. I have asked her to take this back to [facility] to see if there is anything they can do 4/21; patient presents for her 1 week follow-up. She has been treated with a total contact cast weekly. She has no complaints today. She denies any fever/chills. 4/26; patient presents for 1 week follow-up in total contact cast change. Patient is having knee pain to her right knee due to  chronic osteoarthritis. She is hoping to be able to wear her brace over the cast. Patient History Information obtained from Patient. Family History Cancer - Paternal Grandparents, Diabetes - Mother,Maternal Grandparents, Heart Disease - Mother,Paternal Grandparents,Maternal Grandparents,Father,Siblings, Hypertension - Mother,Father, Lung Disease - Mother, No family history of Hereditary Spherocytosis, Kidney Disease, Seizures, Stroke, Thyroid Problems, Tuberculosis. Social History Never smoker, Marital Status - Widowed, Alcohol Use - Never, Drug Use - Prior History, Caffeine Use - Daily - soda's. Medical History Eyes Denies history of Cataracts, Glaucoma, Optic Neuritis Ear/Nose/Mouth/Throat Denies history of Chronic sinus problems/congestion, Middle ear problems Hematologic/Lymphatic Denies history of Anemia, Hemophilia, Human Immunodeficiency Virus, Lymphedema, Sickle Cell Disease Respiratory Patient has history of Asthma, Sleep Apnea Denies history of Aspiration, Chronic Obstructive Pulmonary Disease (COPD), Pneumothorax, Tuberculosis Cardiovascular Patient has history of Hypertension Denies history of Angina, Arrhythmia, Congestive Heart Failure, Coronary Artery Disease, Deep Vein Thrombosis, Hypotension, Myocardial Infarction, Peripheral Arterial Disease, Peripheral Venous Disease, Phlebitis, Vasculitis Gastrointestinal Denies history of Cirrhosis , Colitis, Crohnoos, Hepatitis A, Hepatitis B, Hepatitis C Endocrine Denies history of Type I Diabetes, Type II Diabetes Genitourinary Denies history of End Stage Renal Disease Immunological Denies history of Lupus Erythematosus, Raynaudoos, Scleroderma Integumentary (Skin) Denies history of History of Burn Musculoskeletal Patient has history of Osteoarthritis Denies history of Gout, Rheumatoid Arthritis, Osteomyelitis Neurologic Patient has history of Neuropathy, Seizure Disorder - hx of seizures r/t tramadol Denies history of  Dementia, Quadriplegia, Paraplegia Oncologic Denies history of Received Chemotherapy, Received Radiation Psychiatric Denies  history of Anorexia/bulimia, Confinement Anxiety Hospitalization/Surgery History - trasmet amp right foot. - left 4th toe amp. - neck surgery. - spine surgery. - abdom. hysterectomy. - appendectomy. - lap. knee surgery. Medical A Surgical History Notes nd Gastrointestinal Hx of GERD Psychiatric hx of bipolar 1 disorder, anxiety, depression. Objective Constitutional respirations regular, non-labored and within target range for patient.. Vitals Time Taken: 2:11 PM, Height: 64 in, Weight: 228 lbs, BMI: 39.1, Temperature: 98.6 F, Pulse: 80 bpm, Respiratory Rate: 18 breaths/min, Blood Pressure: 124/83 mmHg. Psychiatric pleasant and cooperative. General Notes: Right foot: Patient's wound appears almost closed. Granulation tissue appears healthy. No signs of infection Integumentary (Hair, Skin) Wound #1 status is Open. Original cause of wound was Gradually Appeared. The date acquired was: 03/05/2020. The wound has been in treatment 10 weeks. The wound is located on the Right,Plantar Amputation Site - Transmetatarsal. The wound measures 0.1cm length x 0.1cm width x 0.1cm depth; 0.008cm^2 area and 0.001cm^3 volume. There is Fat Layer (Subcutaneous Tissue) exposed. There is no tunneling or undermining noted. There is a small amount of serosanguineous drainage noted. The wound margin is distinct with the outline attached to the wound base. There is large (67-100%) pink granulation within the wound bed. There is no necrotic tissue within the wound bed. Assessment Active Problems ICD-10 Non-pressure chronic ulcer of other part of right foot with fat layer exposed Other idiopathic peripheral autonomic neuropathy Patient continues to show improvement in wound healing. The wound is almost closed. We will continue with current regimen of calcium alginate and total contact  cast. Procedures Wound #1 Pre-procedure diagnosis of Wound #1 is a Neuropathic Ulcer-Non Diabetic located on the Right,Plantar Amputation Site - Transmetatarsal . There was a Total Contact Cast Procedure by Kalman Shan, DO. Post procedure Diagnosis Wound #1: Same as Pre-Procedure Plan Follow-up Appointments: Return Appointment in 1 week. - with Dr. Arcola Jansky Shower/ Hygiene: May shower with protection but do not get wound dressing(s) wet. Off-Loading: T Contact Cast to Right Lower Extremity otal WOUND #1: - Amputation Site - Transmetatarsal Wound Laterality: Plantar, Right Cleanser: Soap and Water 1 x Per Week/15 Days Discharge Instructions: May shower and wash wound with dial antibacterial soap and water prior to dressing change. Prim Dressing: Maxorb Extra Calcium Alginate 2x2 in 1 x Per Week/15 Days ary Discharge Instructions: Apply calcium alginate to wound bed as instructed Secondary Dressing: Woven Gauze Sponges 2x2 in (Generic) 1 x Per Week/15 Days Discharge Instructions: Apply over primary dressing as directed. Secured With: 26M Medipore H Soft Cloth Surgical T 4 x 2 (in/yd) 1 x Per Week/15 Days ape Discharge Instructions: Secure dressing with tape as directed. 1. T contact cast with calcium alginate otal 2. Follow-up in 1 week Electronic Signature(s) Signed: 08/12/2020 3:34:37 PM By: Kalman Shan DO Entered By: Kalman Shan on 08/12/2020 15:33:33 -------------------------------------------------------------------------------- HxROS Details Patient Name: Date of Service: [name] Shutter D. 08/12/2020 2:00 PM Medical Record Number: [id number] Patient Account Number: [id number] Date of Birth/Sex: Treating RN: Jan 19, 1966 (55 y.o. [name] Primary Care Provider: Jenna Luo Other Clinician: Referring Provider: Treating Provider/Extender: Michel Bickers in Treatment: 10 Information Obtained  From Patient Eyes Medical History: Negative for: Cataracts; Glaucoma; Optic Neuritis Ear/Nose/Mouth/Throat Medical History: Negative for: Chronic sinus problems/congestion; Middle ear problems Hematologic/Lymphatic Medical History: Negative for: Anemia; Hemophilia; Human Immunodeficiency Virus; Lymphedema; Sickle Cell Disease Respiratory Medical History: Positive for: Asthma; Sleep Apnea Negative for: Aspiration; Chronic Obstructive Pulmonary Disease (COPD); Pneumothorax; Tuberculosis Cardiovascular Medical History: Positive for: Hypertension Negative for:  Angina; Arrhythmia; Congestive Heart Failure; Coronary Artery Disease; Deep Vein Thrombosis; Hypotension; Myocardial Infarction; Peripheral Arterial Disease; Peripheral Venous Disease; Phlebitis; Vasculitis Gastrointestinal Medical History: Negative for: Cirrhosis ; Colitis; Crohns; Hepatitis A; Hepatitis B; Hepatitis C Past Medical History Notes: Hx of GERD Endocrine Medical History: Negative for: Type I Diabetes; Type II Diabetes Genitourinary Medical History: Negative for: End Stage Renal Disease Immunological Medical History: Negative for: Lupus Erythematosus; Raynauds; Scleroderma Integumentary (Skin) Medical History: Negative for: History of Burn Musculoskeletal Medical History: Positive for: Osteoarthritis Negative for: Gout; Rheumatoid Arthritis; Osteomyelitis Neurologic Medical History: Positive for: Neuropathy; Seizure Disorder - hx of seizures r/t tramadol Negative for: Dementia; Quadriplegia; Paraplegia Oncologic Medical History: Negative for: Received Chemotherapy; Received Radiation Psychiatric Medical History: Negative for: Anorexia/bulimia; Confinement Anxiety Past Medical History Notes: hx of bipolar 1 disorder, anxiety, depression. Immunizations Pneumococcal Vaccine: Received Pneumococcal Vaccination: Yes Immunization Notes: pt. doesn't remember last tetanus shot Implantable  Devices None Hospitalization / Surgery History Type of Hospitalization/Surgery trasmet amp right foot left 4th toe amp neck surgery spine surgery abdom. hysterectomy appendectomy lap. knee surgery Family and Social History Cancer: Yes - Paternal Grandparents; Diabetes: Yes - Mother,Maternal Grandparents; Heart Disease: Yes - Mother,Paternal Grandparents,Maternal Grandparents,Father,Siblings; Hereditary Spherocytosis: No; Hypertension: Yes - Mother,Father; Kidney Disease: No; Lung Disease: Yes - Mother; Seizures: No; Stroke: No; Thyroid Problems: No; Tuberculosis: No; Never smoker; Marital Status - Widowed; Alcohol Use: Never; Drug Use: Prior History; Caffeine Use: Daily - soda's; Financial Concerns: No; Food, Clothing or Shelter Needs: No; Support System Lacking: No; Transportation Concerns: No Electronic Signature(s) Signed: 08/12/2020 3:34:37 PM By: Kalman Shan DO Signed: 08/12/2020 6:28:10 PM By: Baruch Gouty RN, BSN Entered By: Kalman Shan on 08/12/2020 15:31:42 -------------------------------------------------------------------------------- Total Contact Cast Details Patient Name: Date of Service: [name] Shutter D. 08/12/2020 2:00 PM Medical Record Number: [id number] Patient Account Number: [id number] Date of Birth/Sex: Treating RN: 12/18/1965 (55 y.o. [name] Primary Care Provider: Jenna Luo Other Clinician: Referring Provider: Treating Provider/Extender: Michel Bickers in Treatment: 51 T Contact Cast Applied for Wound Assessment: otal Wound #1 Right,Plantar Amputation Site - Transmetatarsal Performed By: Physician Kalman Shan, DO Post Procedure Diagnosis Same as Pre-procedure Electronic Signature(s) Signed: 08/12/2020 3:34:37 PM By: Kalman Shan DO Signed: 08/12/2020 6:28:10 PM By: Baruch Gouty RN, BSN Entered By: Baruch Gouty on 08/12/2020  15:27:56 -------------------------------------------------------------------------------- [place] Details Patient Name: Date of Service: [name] 08/12/2020 Medical Record Number: [id number] Patient Account Number: [id number] Date of Birth/Sex: Treating RN: July 27, 1965 (55 y.o. [name] Primary Care Provider: Jenna Luo Other Clinician: Referring Provider: Treating Provider/Extender: Michel Bickers in Treatment: 10 Diagnosis Coding ICD-10 Codes Code Description [phone] Non-pressure chronic ulcer of other part of right foot with fat layer exposed G90.09 Other idiopathic peripheral autonomic neuropathy Facility Procedures CPT4 Code: [id number] Description: [phone] - APPLY TOTAL CONTACT LEG CAST Modifier: Quantity: 1 Electronic Signature(s) Signed: 08/12/2020 3:34:37 PM By: Kalman Shan DO Entered By: Kalman Shan on 08/12/2020 15:34:16

## 2020-08-13 IMAGING — MR MR FOOT*R* WO/W CM
4 of 9 series · 19 of 40 positions shown · IV contrast (gadavist)
Comparison: Radiographs 05/15/2019

CLINICAL DATA: Pain, swelling and suspected osteomyelitis.

EXAM:
MRI OF THE RIGHT FOREFOOT WITHOUT AND WITH CONTRAST
TECHNIQUE: Multiplanar, multisequence MR imaging of the right foot was
performed before and after the administration of intravenous
contrast.
CONTRAST:  10mL GADAVIST GADOBUTROL 1 MMOL/ML IV SOLN

[Series 5: T1 · axial · 3.0mm · 0.27mm/px · z∈[-81,+79]mm · 7 of 49 slices shown (1 of 2)]
[im 1/49]
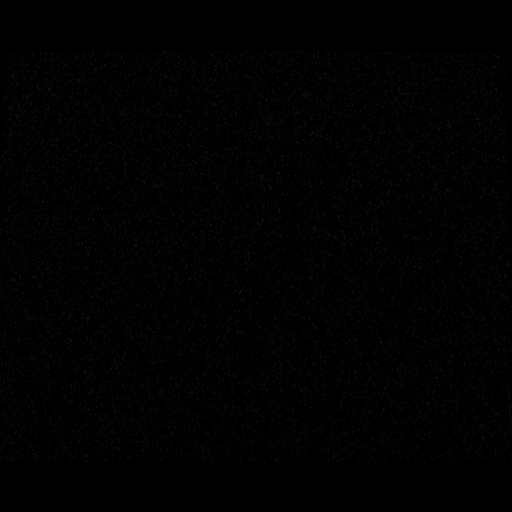
[im 9/49]
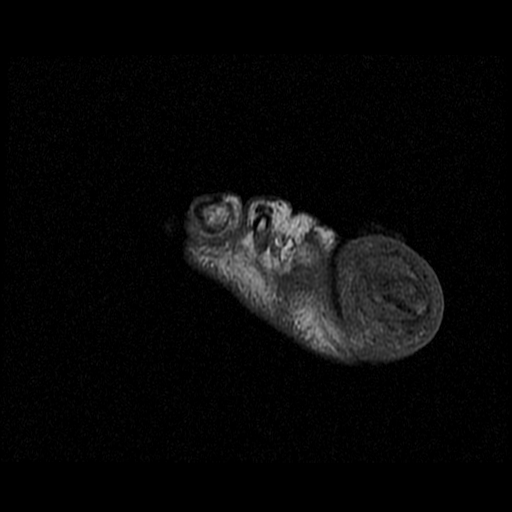
[im 17/49]
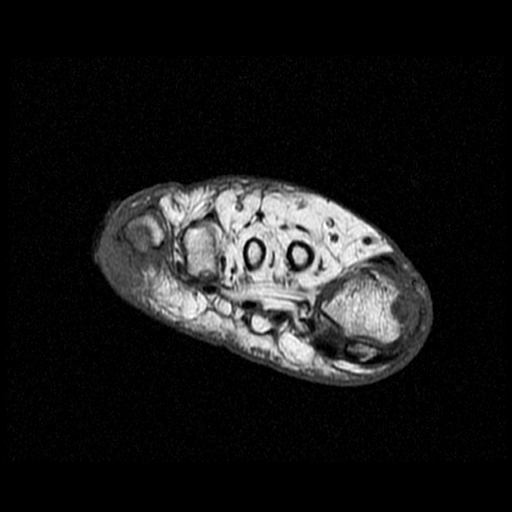
[im 25/49]
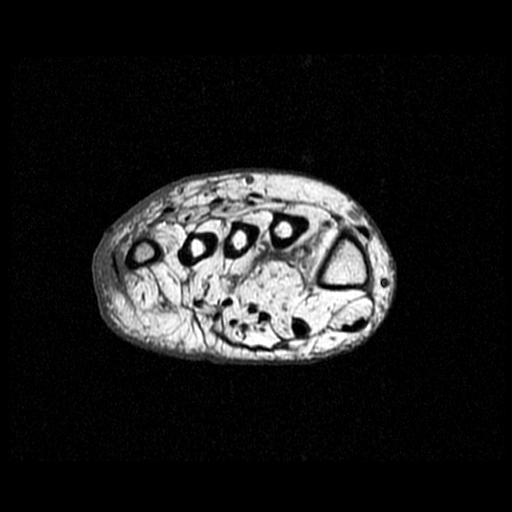
[im 33/49]
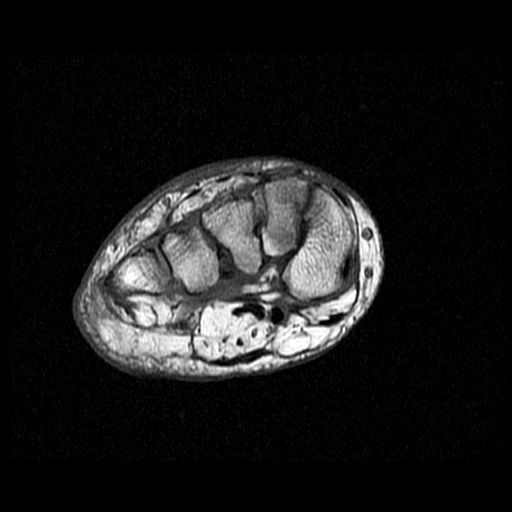
[im 41/49]
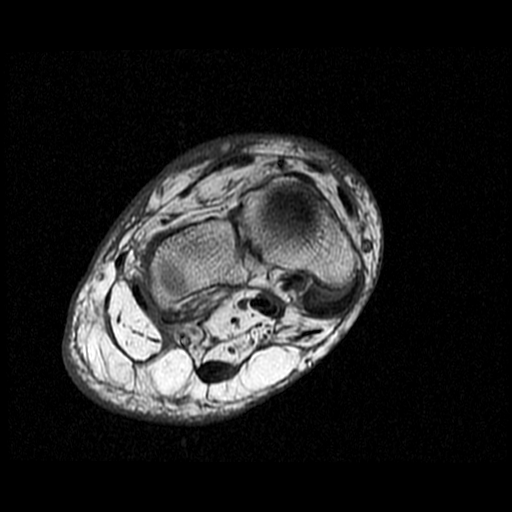
[im 49/49]
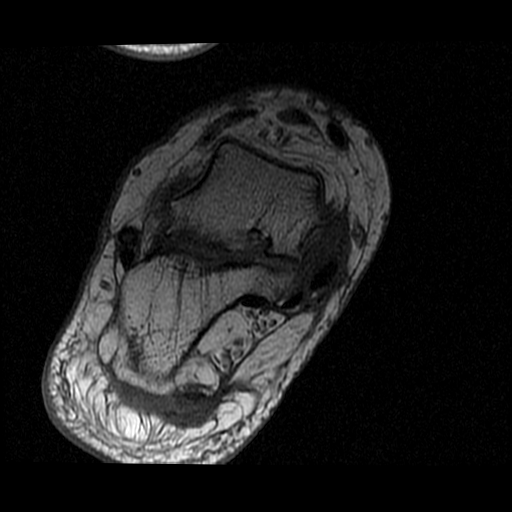

[Series 8: T1 · oblique · 3.0mm · 0.35mm/px · 2 of 19 slices shown (2 of 2)]
[im 1/19]
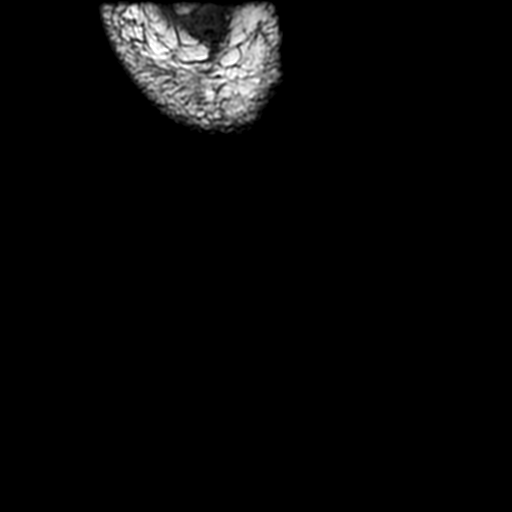
[im 19/19]
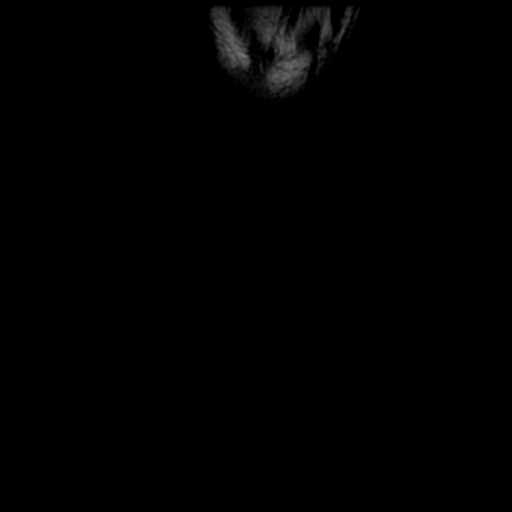

[Series 9: T1 fat-sat · axial · non-contrast · 3.0mm · 0.27mm/px · z∈[-81,+79]mm · 6 of 49 slices shown]
[im 1/49]
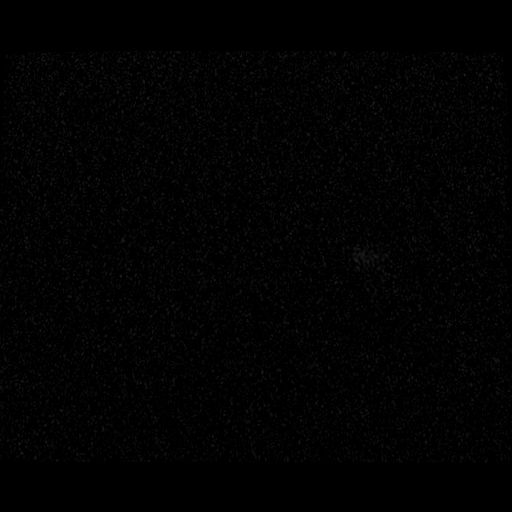
[im 10/49]
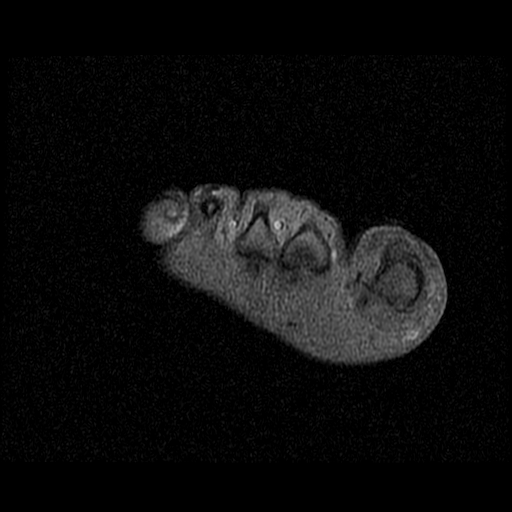
[im 20/49]
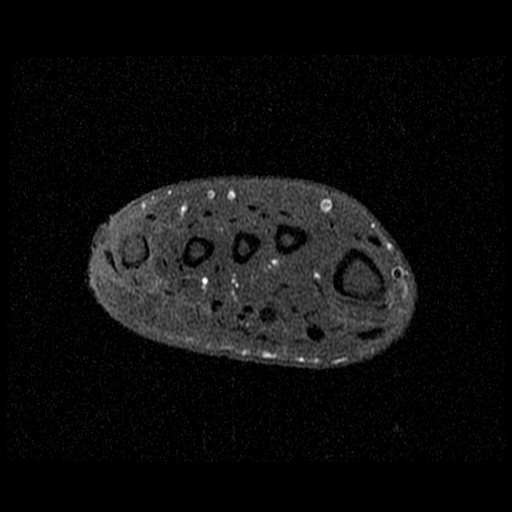
[im 29/49]
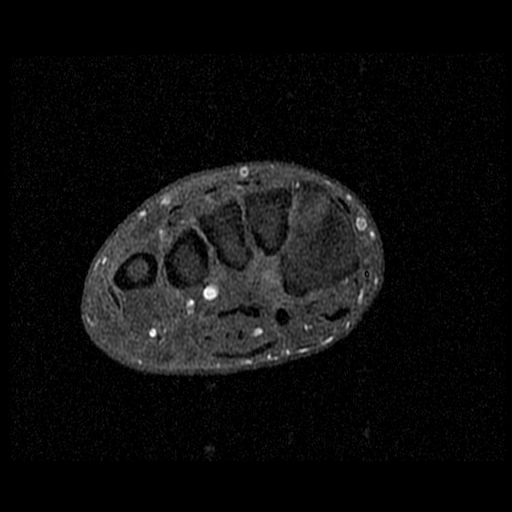
[im 39/49]
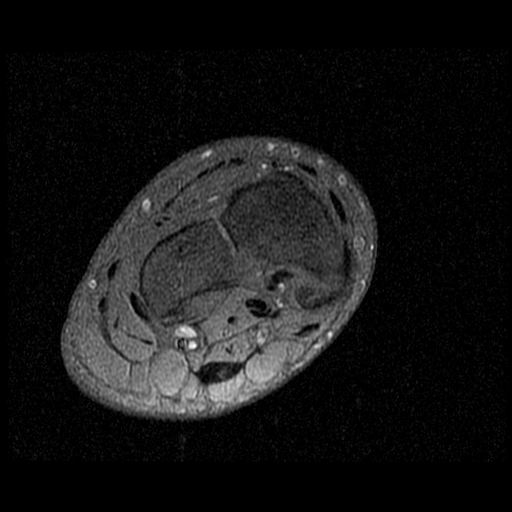
[im 49/49]
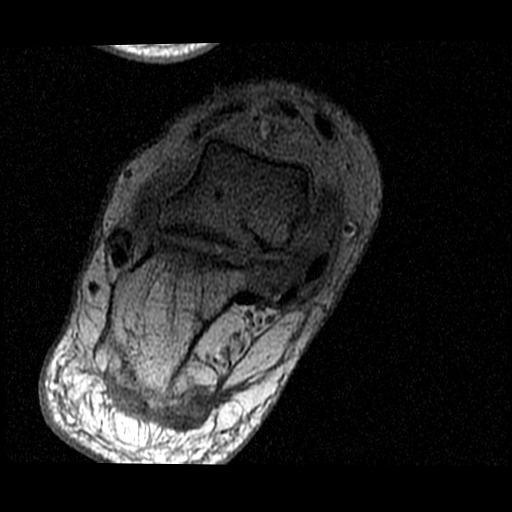

[Series 10: T1 fat-sat post-contrast · axial · 3.0mm · 0.27mm/px · z∈[-81,+79]mm · 4 of 49 slices shown]
[im 1/49]
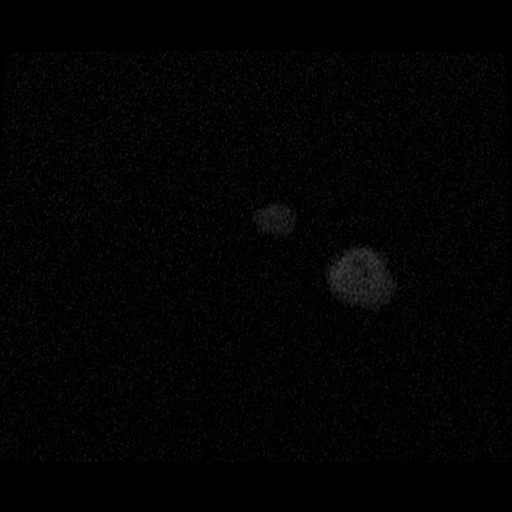
[im 10/49]
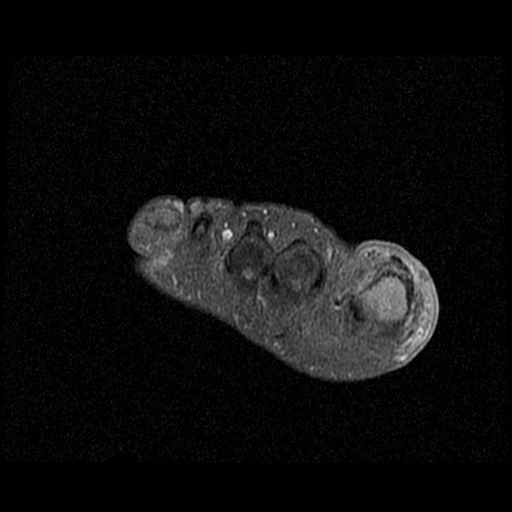
[im 29/49]
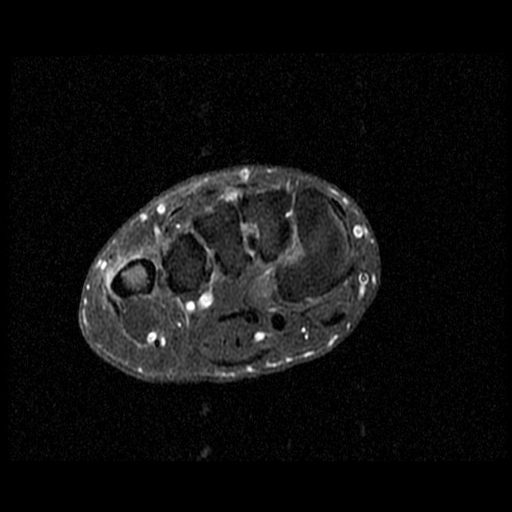
[im 49/49]
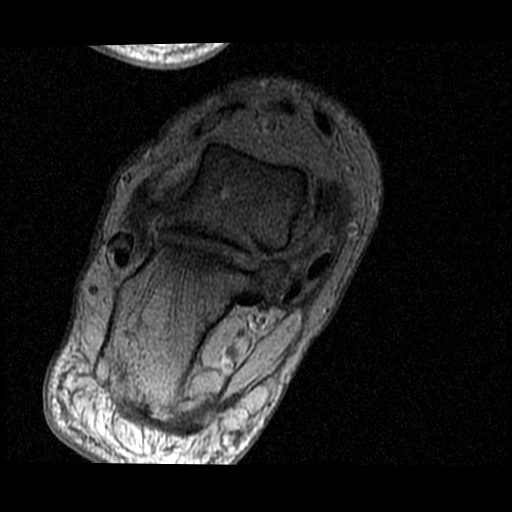

[19 of 40 positions shown; findings below may reference images not displayed]

FINDINGS: Bones/Joint/Cartilage

Prior amputation of the second toe except for the base of the
proximal phalanx. Chronic appearing ankylosis at the interphalangeal
joint of the great toe.

There is an open wound along the lateral aspect of the forefoot at
the level of the fifth metatarsal head. This appears to extend right
down to the bone. There is associated osteomyelitis involving the
distal shaft and head of the fifth metatarsal with surrounding
inflammatory changes but no discrete drainable soft tissue abscess.
I do not see any definite findings for septic arthritis and the
proximal phalanx appears intact.

Diffuse T1 and T2 signal abnormality in the proximal and distal
phalanges of the great toe and subsequent contrast enhancement
consistent with osteomyelitis. Is also diffuse surrounding
cellulitis but no discrete soft tissue abscess.

The other bony structures are intact. I do not see any areas of
septic arthritis or pyomyositis.
IMPRESSION: 1. Open wound along the lateral aspect of the forefoot at the level
of the fifth metatarsal head. There is underlying osteomyelitis
involving the distal shaft and head of the fifth metatarsal.
2. Osteomyelitis involving proximal and distal phalanges of the
great toe.
3. Cellulitis but no discrete soft tissue abscess. No findings for
pyomyositis.
4. Prior amputation of the second toe except for the base of the
proximal phalanx.

## 2020-08-14 ENCOUNTER — Other Ambulatory Visit: Payer: Self-pay | Admitting: Cardiovascular Disease

## 2020-08-14 ENCOUNTER — Other Ambulatory Visit: Payer: Self-pay

## 2020-08-14 MED ORDER — METOPROLOL SUCCINATE ER 25 MG PO TB24: ORAL_TABLET | ORAL | 0 refills | Status: DC

## 2020-08-15 IMAGING — DX DG FOOT 2V*R*
2 series · 2 of 2 positions shown · non-contrast
Comparison: MRI 05/16/2019

CLINICAL DATA: Status post proximal metatarsal amputation.

EXAM:
RIGHT FOOT - 2 VIEW

[foot ap]
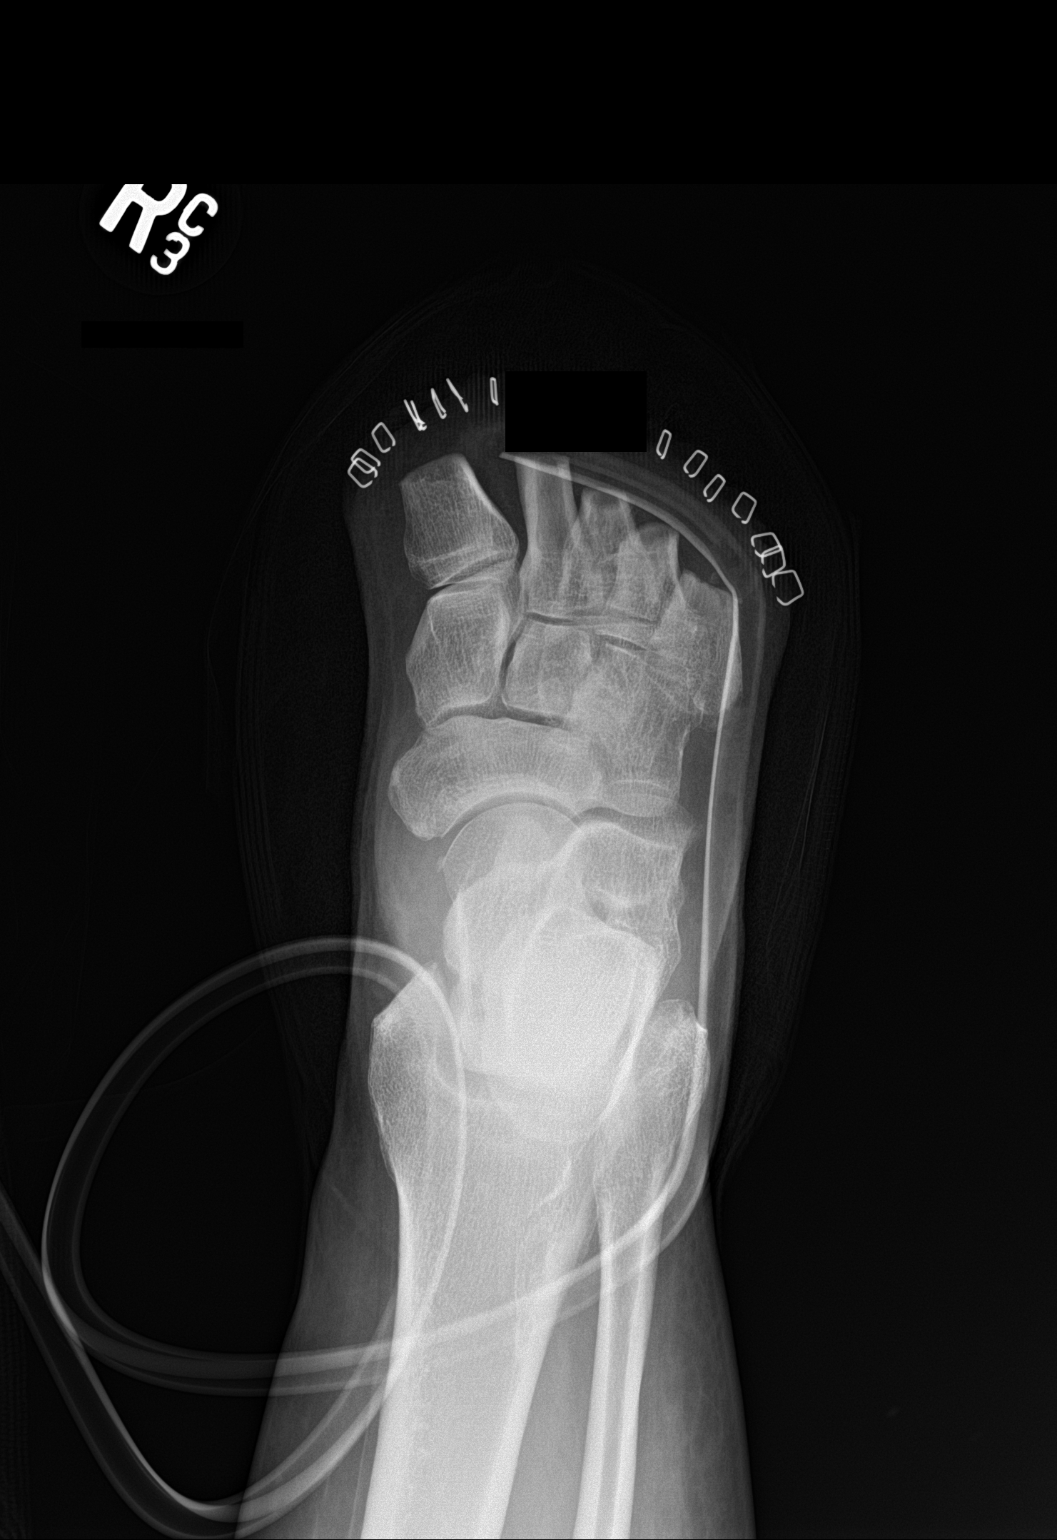

[foot lat]
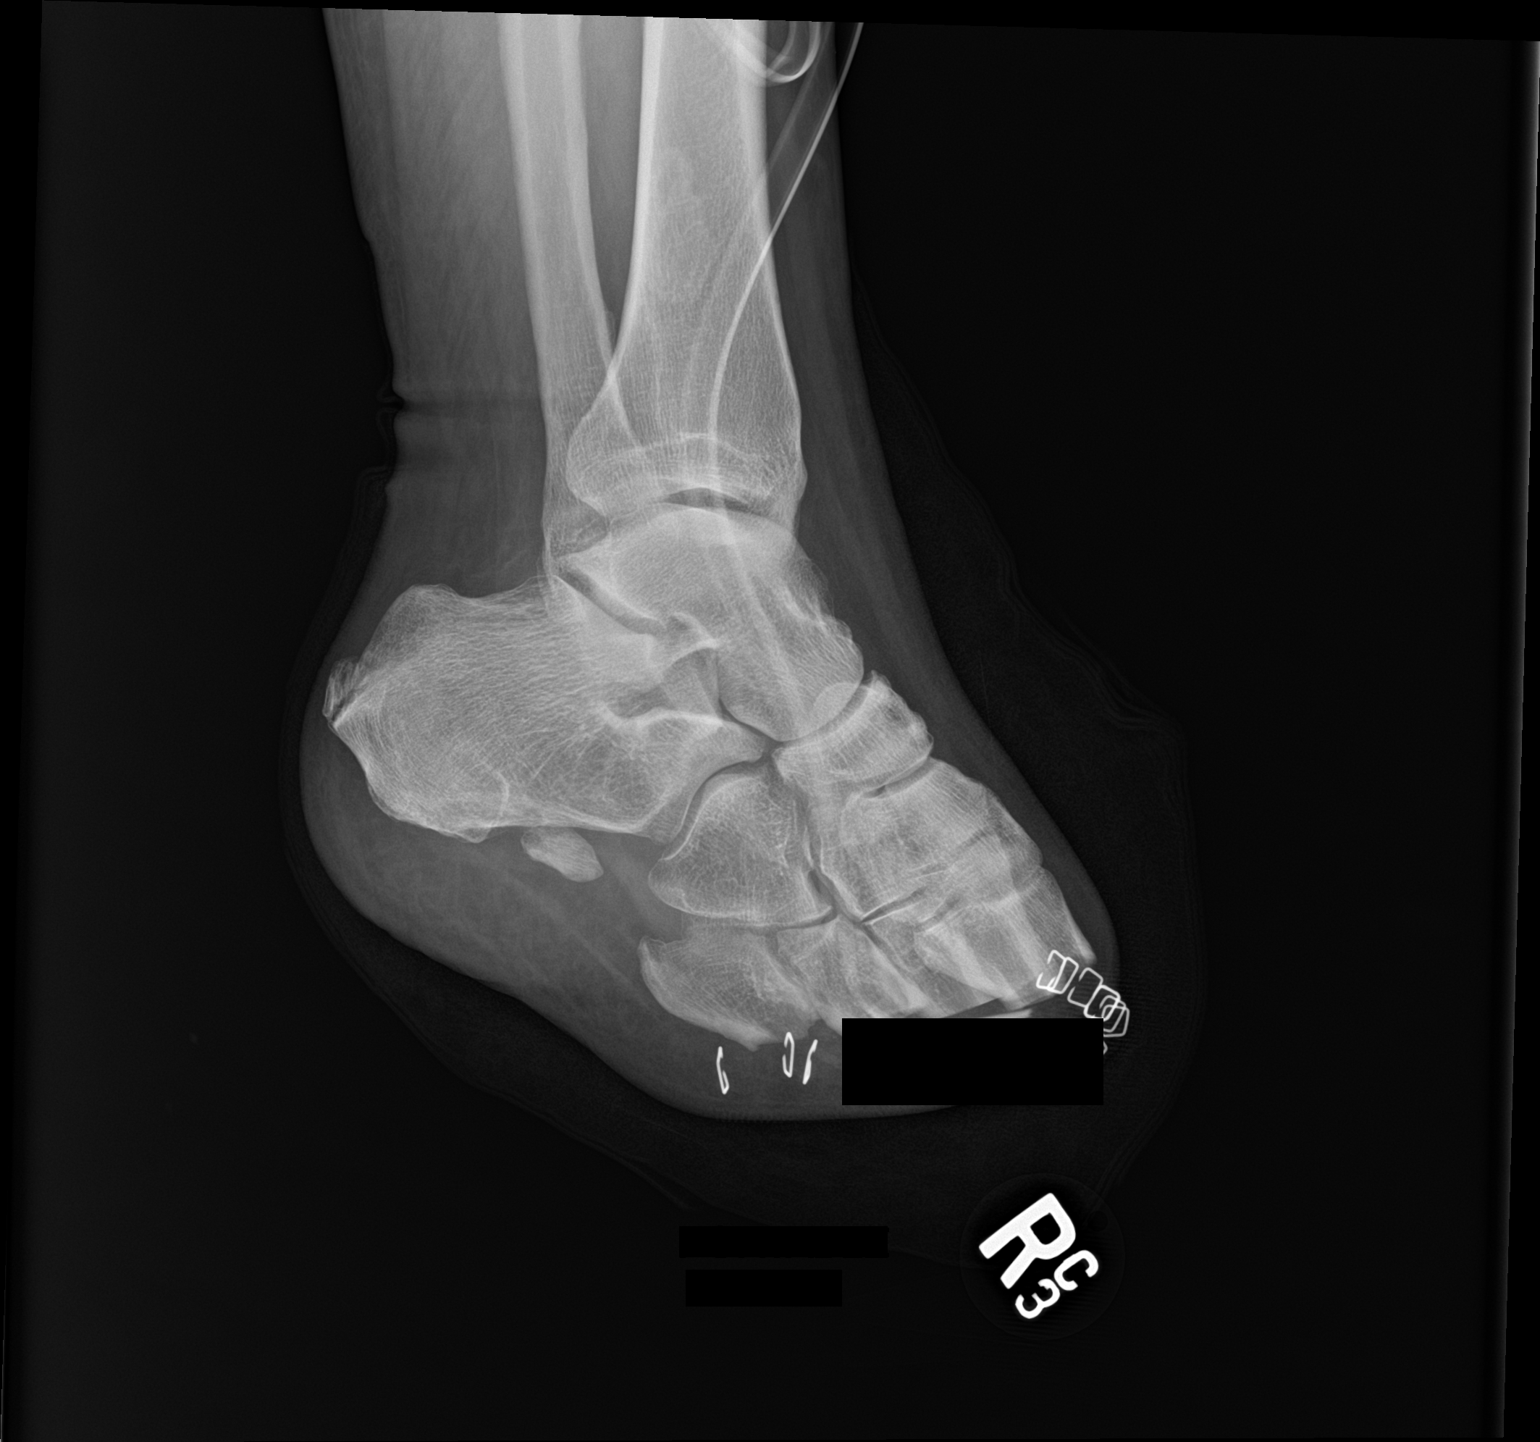

[2 of 2 positions shown; findings below may reference images not displayed]

FINDINGS: Surgical changes from a proximal metatarsal amputation.

A postsurgical drain is noted in place. No complicating features are
identified.
IMPRESSION: Surgical changes from a proximal metatarsal amputation.

## 2020-08-19 ENCOUNTER — Encounter (HOSPITAL_BASED_OUTPATIENT_CLINIC_OR_DEPARTMENT_OTHER): Payer: 59 | Admitting: Internal Medicine

## 2020-08-21 ENCOUNTER — Ambulatory Visit (INDEPENDENT_AMBULATORY_CARE_PROVIDER_SITE_OTHER): Payer: 59

## 2020-08-21 DIAGNOSIS — Z0000 Encounter for general adult medical examination without abnormal findings: Secondary | ICD-10-CM

## 2020-08-21 NOTE — Patient Instructions (Signed)
[name] [name] , Thank you for taking time to come for your Medicare Wellness Visit. I appreciate your ongoing commitment to your health goals. Please review the following plan we discussed and let me know if I can assist you in the future.   Screening recommendations/referrals: Colonoscopy: Up to date, next due 04/19/2021  Mammogram: Currently due, please keep scheduled appointment Bone Density: Not due until age 57  Recommended yearly ophthalmology/optometry visit for glaucoma screening and checkup Recommended yearly dental visit for hygiene and checkup  Vaccinations: Influenza vaccine: Up to date, next due fall 2022  Pneumococcal vaccine: Not due until age 75  Tdap vaccine: Up to date, next due 02/21/2028 Shingles vaccine: Currently due for Shingrix, if you would like to receive we recommend that you do so at your local pharmacy     Advanced directives: Advance directive discussed with you today. Even though you declined this today please call our office should you change your mind and we can give you the proper paperwork for you to fill out.   Conditions/risks identified: none  Next appointment: 08/27/2021 @ 2:00 PM with [place] [facility] Preventive care refers to lifestyle choices and visits with your health care provider that can promote health and wellness. What does preventive care include?  A yearly physical exam. This is also called an annual well check.  Dental exams once or twice a year.  Routine eye exams. Ask your health care provider how often you should have your eyes checked.  Personal lifestyle choices, including:  Daily care of your teeth and gums.  Regular physical activity.  Eating a healthy diet.  Avoiding tobacco and drug use.  Limiting alcohol use.  Practicing safe sex.  Taking low-dose aspirin daily starting at age 31.  Taking vitamin and mineral supplements as recommended by your health care  provider. What happens during an annual well check? The services and screenings done by your health care provider during your annual well check will depend on your age, overall health, lifestyle risk factors, and family history of disease. Counseling  Your health care provider may ask you questions about your:  Alcohol use.  Tobacco use.  Drug use.  Emotional well-being.  Home and relationship well-being.  Sexual activity.  Eating habits.  Work and work Statistician.  Method of birth control.  Menstrual cycle.  Pregnancy history. Screening  You may have the following tests or measurements:  Height, weight, and BMI.  Blood pressure.  Lipid and cholesterol levels. These may be checked every 5 years, or more frequently if you are over 44 years old.  Skin check.  Lung cancer screening. You may have this screening every year starting at age 25 if you have a 30-pack-year history of smoking and currently smoke or have quit within the past 15 years.  Fecal occult blood test (FOBT) of the stool. You may have this test every year starting at age 49.  Flexible sigmoidoscopy or colonoscopy. You may have a sigmoidoscopy every 5 years or a colonoscopy every 10 years starting at age 76.  Hepatitis C blood test.  Hepatitis B blood test.  Sexually transmitted disease (STD) testing.  Diabetes screening. This is done by checking your blood sugar (glucose) after you have not eaten for a while (fasting). You may have this done every 1-3 years.  Mammogram. This may be done every 1-2 years. Talk to your health care provider about when you should start having regular mammograms. This may depend  on whether you have a family history of breast cancer.  BRCA-related cancer screening. This may be done if you have a family history of breast, ovarian, tubal, or peritoneal cancers.  Pelvic exam and Pap test. This may be done every 3 years starting at age 66. Starting at age 64, this may be  done every 5 years if you have a Pap test in combination with an HPV test.  Bone density scan. This is done to screen for osteoporosis. You may have this scan if you are at high risk for osteoporosis. Discuss your test results, treatment options, and if necessary, the need for more tests with your health care provider. Vaccines  Your health care provider may recommend certain vaccines, such as:  Influenza vaccine. This is recommended every year.  Tetanus, diphtheria, and acellular pertussis (Tdap, Td) vaccine. You may need a Td booster every 10 years.  Zoster vaccine. You may need this after age 11.  Pneumococcal 13-valent conjugate (PCV13) vaccine. You may need this if you have certain conditions and were not previously vaccinated.  Pneumococcal polysaccharide (PPSV23) vaccine. You may need one or two doses if you smoke cigarettes or if you have certain conditions. Talk to your health care provider about which screenings and vaccines you need and how often you need them. This information is not intended to replace advice given to you by your health care provider. Make sure you discuss any questions you have with your health care provider. Document Released: 05/02/2015 Document Revised: 12/24/2015 Document Reviewed: 02/04/2015 Elsevier Interactive Patient Education  2017 [place] Prevention in the Home Falls can cause injuries. They can happen to people of all ages. There are many things you can do to make your home safe and to help prevent falls. What can I do on the outside of my home?  Regularly fix the edges of walkways and driveways and fix any cracks.  Remove anything that might make you trip as you walk through a door, such as a raised step or threshold.  Trim any bushes or trees on the path to your home.  Use bright outdoor lighting.  Clear any walking paths of anything that might make someone trip, such as rocks or tools.  Regularly check to see if  handrails are loose or broken. Make sure that both sides of any steps have handrails.  Any raised decks and porches should have guardrails on the edges.  Have any leaves, snow, or ice cleared regularly.  Use sand or salt on walking paths during winter.  Clean up any spills in your garage right away. This includes oil or grease spills. What can I do in the bathroom?  Use night lights.  Install grab bars by the toilet and in the tub and shower. Do not use towel bars as grab bars.  Use non-skid mats or decals in the tub or shower.  If you need to sit down in the shower, use a plastic, non-slip stool.  Keep the floor dry. Clean up any water that spills on the floor as soon as it happens.  Remove soap buildup in the tub or shower regularly.  Attach bath mats securely with double-sided non-slip rug tape.  Do not have throw rugs and other things on the floor that can make you trip. What can I do in the bedroom?  Use night lights.  Make sure that you have a light by your bed that is easy to reach.  Do not use any  sheets or blankets that are too big for your bed. They should not hang down onto the floor.  Have a firm chair that has side arms. You can use this for support while you get dressed.  Do not have throw rugs and other things on the floor that can make you trip. What can I do in the kitchen?  Clean up any spills right away.  Avoid walking on wet floors.  Keep items that you use a lot in easy-to-reach places.  If you need to reach something above you, use a strong step stool that has a grab bar.  Keep electrical cords out of the way.  Do not use floor polish or wax that makes floors slippery. If you must use wax, use non-skid floor wax.  Do not have throw rugs and other things on the floor that can make you trip. What can I do with my stairs?  Do not leave any items on the stairs.  Make sure that there are handrails on both sides of the stairs and use them. Fix  handrails that are broken or loose. Make sure that handrails are as long as the stairways.  Check any carpeting to make sure that it is firmly attached to the stairs. Fix any carpet that is loose or worn.  Avoid having throw rugs at the top or bottom of the stairs. If you do have throw rugs, attach them to the floor with carpet tape.  Make sure that you have a light switch at the top of the stairs and the bottom of the stairs. If you do not have them, ask someone to add them for you. What else can I do to help prevent falls?  Wear shoes that:  Do not have high heels.  Have rubber bottoms.  Are comfortable and fit you well.  Are closed at the toe. Do not wear sandals.  If you use a stepladder:  Make sure that it is fully opened. Do not climb a closed stepladder.  Make sure that both sides of the stepladder are locked into place.  Ask someone to hold it for you, if possible.  Clearly mark and make sure that you can see:  Any grab bars or handrails.  First and last steps.  Where the edge of each step is.  Use tools that help you move around (mobility aids) if they are needed. These include:  Canes.  Walkers.  Scooters.  Crutches.  Turn on the lights when you go into a dark area. Replace any light bulbs as soon as they burn out.  Set up your furniture so you have a clear path. Avoid moving your furniture around.  If any of your floors are uneven, fix them.  If there are any pets around you, be aware of where they are.  Review your medicines with your doctor. Some medicines can make you feel dizzy. This can increase your chance of falling. Ask your doctor what other things that you can do to help prevent falls. This information is not intended to replace advice given to you by your health care provider. Make sure you discuss any questions you have with your health care provider. Document Released: 01/30/2009 Document Revised: 09/11/2015 Document Reviewed:  05/10/2014 Elsevier Interactive Patient Education  2017 [employer].

## 2020-08-21 NOTE — Progress Notes (Signed)
Subjective:   [name] [name] is a 55 y.o. female who presents for an Initial Medicare Annual Wellness Visit.  I connected with Markee Remlinger today by telephone and verified that I am speaking with the correct person using two identifiers. Location patient: home Location provider: work Persons participating in the virtual visit: patient, provider.   I discussed the limitations, risks, security and privacy concerns of performing an evaluation and management service by telephone and the availability of in person appointments. I also discussed with the patient that there may be a patient responsible charge related to this service. The patient expressed understanding and verbally consented to this telephonic visit.    Interactive audio and video telecommunications were attempted between this provider and patient, however failed, due to patient having technical difficulties OR patient did not have access to video capability.  We continued and completed visit with audio only.      Review of Systems    N/A  Cardiac Risk Factors include: hypertension;obesity (BMI >30kg/m2)     Objective:    Today's Vitals   08/21/20 1406  PainSc: 6    There is no height or weight on file to calculate BMI.  Advanced Directives 08/21/2020 09/04/2019 08/26/2019 05/15/2019 05/15/2019 08/01/2018 05/09/2018  Does Patient Have a Medical Advance Directive? _0  No No  Would patient like information on creating a medical advance directive? No - Patient declined No - Patient declined No - Patient declined No - Patient declined - No - Patient declined No - Patient declined  Pre-existing out of facility DNR order (yellow form or pink MOST form) - - - - - - -  Some encounter information is confidential and restricted. Go to Review Flowsheets activity to see all data.    Current Medications (verified) Outpatient Encounter Medications as of 08/21/2020  Medication Sig  . albuterol (PROAIR HFA) 108 (90 Base)  MCG/ACT inhaler Inhale 2 puffs = 172mg into the lungs every 6 (six) hours as needed for wheezing or shortness of breath.  . busPIRone (BUSPAR) 15 MG tablet Take 15 mg by mouth 3 (three) times daily.   . Calcium Carbonate-Vit D-Min (CALCIUM 1200 PO) Take 1,200 mg by mouth daily.   . cyclobenzaprine (FLEXERIL) 10 MG tablet Take 10 mg by mouth 2 (two) times daily as needed for muscle spasms.   . DULoxetine (CYMBALTA) 60 MG capsule TAKE 1 CAPSULE BY MOUTH EVERY DAY  . Erenumab-aooe (AIMOVIG) 70 MG/ML SOAJ Inject 70 mg into the skin every 30 (thirty) days.  .Marland Kitchenescitalopram (LEXAPRO) 10 MG tablet Take 10 mg by mouth daily.  .Marland Kitchenestradiol (ESTRACE) 1 MG tablet Take 1 tablet (1 mg total) by mouth at bedtime.  . fluticasone (FLONASE) 50 MCG/ACT nasal spray SPRAY 2 SPRAYS INTO EACH NOSTRIL EVERY DAY  . haloperidol (HALDOL) 5 MG tablet Take 1 tablet (5 mg total) by mouth at bedtime. For psychosis  . LINZESS 290 MCG CAPS capsule TAKE 1 CAPSULE BY MOUTH EVERY DAY IN THE MORNING BEFORE BREAKFAST  . meloxicam (MOBIC) 15 MG tablet Take 15 mg by mouth at bedtime.   . metoprolol succinate (TOPROL-XL) 25 MG 24 hr tablet TAKE 1 TABLET(25 MG) BY MOUTH DAILY  . Multiple Vitamins-Minerals (MULTIVITAMIN WITH MINERALS) tablet Take 1 tablet by mouth daily.  .Marland Kitchennystatin (MYCOSTATIN/NYSTOP) powder APPLY TO AFFECTED AREA 4 TIMES A DAY  . OVER THE COUNTER MEDICATION Fennel Seed  . oxyCODONE-acetaminophen (PERCOCET) 7.5-325 MG tablet Take 1 tablet by mouth 5 (five) times  daily as needed for severe pain.  . pantoprazole (PROTONIX) 40 MG tablet TAKE 1 TABLET BY MOUTH TWICE A DAY  . potassium chloride SA (KLOR-CON) 20 MEQ tablet TAKE 2 TABLET BY MOUTH EVERY DAY  . pregabalin (LYRICA) 100 MG capsule Take 100 mg by mouth 2 (two) times daily.  . SUMAtriptan (IMITREX) 50 MG tablet TAKE 1 TAB EVERY 2 HOURS AS NEEDED FOR MIGRAINE. MAY REPEAT IN 2 HOURS IF PERSISTS OR RECURS  . topiramate (TOPAMAX) 50 MG tablet Take 1 tablet (50 mg total)  by mouth 2 (two) times daily.  . traZODone (DESYREL) 100 MG tablet Take 100 mg by mouth at bedtime.  Marland Kitchen levocetirizine (XYZAL) 5 MG tablet TAKE 1 TABLET BY MOUTH EVERY DAY IN THE EVENING (Patient not taking: Reported on 08/21/2020)  . NARCAN 4 MG/0.1ML LIQD nasal spray kit Place 1 spray into the nose once.  (Patient not taking: Reported on 08/21/2020)  . [DISCONTINUED] predniSONE (DELTASONE) 10 MG tablet Take 1 tablet (10 mg total) by mouth daily with breakfast. Take 61m on days 1-2. Take 351mon days 3-4. Take 2065mn days 5-6. Take 39m11m days 7-8. Take 5mg 38mdays 9-10, then stop.   No facility-administered encounter medications on file as of 08/21/2020.    Allergies (verified) Other, Penicillins, Tetracyclines & related, Tramadol, Phenazopyridine, Tetracycline, Ciprofloxacin, Codeine, and Estradiol   History: Past Medical History:  Diagnosis Date  . Anxiety   . Arthritis    [facility] 02/08/2020  . Asthma    mild intermittent  . Asthma    [facility] 02/08/2020  . Bipolar 1 disorder ([name]) [facility] Parkect treatments last treatment Sep 02 1011  . Depression   . Depression    [facility] 02/08/2020  . Depression    [facility] 07/10/2020  . GERD (gastroesophageal reflux disease)   . Hypertension   . Pre-diabetes   . Seizures ([name]) [place] seizure was so long ago she can't remember.   . Sleep apnea    wears CPAP, uncertain of setting  . Substance abuse ([name]) [facility] 02/08[facility]   Past Surgical History:  Procedure Laterality Date  . ABDOMINAL HYSTERECTOMY    . AMPUTATION TOE Left 02/02/2018   Procedure: AMPUTATION TOE Left 4th toe;  Surgeon: WagonTrula Slade;  [facility];  Laterality: Left;  . APPENDECTOMY N/A    [facility] 02/08/2020  . BACK SURGERY    . CARPAL TUNNEL RELEASE     x2  . Laproscopic knee surgery    . NECK SURGERY  03/15/2017  . PLANTAR FASCIA RELEASE     x2  . SPINE SURGERY N/A    [facility] 02/08/2020  . 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 Diabetes Mother   . COPD Mother   . Hypertension Mother   . Hyperlipidemia Mother   . Heart disease Father   . Hyperlipidemia Father   . Hypertension Father   . Cancer Father   . Heart disease Brother   . Heart disease Daughter   . Heart disease Maternal Grandmother   . Hypertension Maternal Grandmother   . Heart disease Maternal Grandfather   . Kidney cancer Paternal Grandmother   . Heart disease Paternal Grandfather    Social History   Socioeconomic History  . Marital status:  Widowed    Spouse name: Not on file  . Number of children: 3  . Years of education: 32  . Highest education level: Not on file  Occupational History  . Occupation: Disabled  Tobacco Use  . Smoking status: Never Smoker  . Smokeless tobacco: Never Used  Vaping Use  . Vaping Use: Never used  Substance and Sexual Activity  . Alcohol use: No  . Drug use: No  . Sexual activity: Not on file  Other Topics Concern  . Not on file  Social History Narrative   Patient drink 4 16oz caffeine drinks a day    Social Determinants of Health   Financial Resource Strain: Low Risk   . Difficulty of Paying Living Expenses: Not hard at all  Food Insecurity: No Food Insecurity  . Worried About Charity fundraiser in the Last Year: Never true  . Ran Out of Food in the Last Year: Never true  Transportation Needs: No Transportation Needs  . Lack of Transportation (Medical): No  . Lack of Transportation (Non-Medical): No  Physical Activity: Inactive  . Days of Exercise per Week: 0 days  . Minutes of Exercise per Session: 0 min  Stress: No Stress Concern Present  . Feeling of Stress : Not at all  Social Connections: Socially Isolated  . Frequency of Communication with Friends and Family: More than three times a week  .  Frequency of Social Gatherings with Friends and Family: Twice a week  . Attends Religious Services: Never  . Active Member of Clubs or Organizations: No  . Attends Archivist Meetings: Never  . Marital Status: Widowed    Tobacco Counseling Counseling given: Not Answered   Clinical Intake:  Pre-visit preparation completed: Yes  Pain : 0-10 Pain Score: 6  Pain Type: Chronic pain Pain Location: Back (Hip, knee) Pain Descriptors / Indicators: Aching Pain Onset: More than a month ago Pain Frequency: Intermittent Pain Relieving Factors: Pain medications  Pain Relieving Factors: Pain medications  Nutritional Risks: None Diabetes: No  How often do you need to have someone help you when you read instructions, pamphlets, or other written materials from your doctor or pharmacy?: 1 - Never  Diabetic? No   Interpreter Needed?: No  Information entered by :: [place] of Daily Living In your present state of health, do you have any difficulty performing the following activities: 08/21/2020 08/26/2019  Hearing? N N  Vision? N N  Difficulty concentrating or making decisions? Tempie Donning  Walking or climbing stairs? Y Y  Dressing or bathing? N N  Doing errands, shopping? Y N  Preparing Food and eating ? N -  Using the Toilet? N -  In the past six months, have you accidently leaked urine? N -  Do you have problems with loss of bowel control? N -  Managing your Medications? N -  Managing your Finances? N -  Housekeeping or managing your Housekeeping? N -  Some recent data might be hidden    Patient Care Team: [name], MD as PCP - General (Family Medicine) [name], MD as PCP - Cardiology (Cardiology)  Indicate any recent Medical Services you may have received from other than [facility] providers in the past year (date may be approximate).     Assessment:   This is a routine wellness examination for [name] [name].  Hearing/Vision screen  Hearing  Screening   125Hz 250Hz 500Hz 1000Hz 2000Hz 3000Hz 4000Hz 6000Hz 8000Hz  Right ear:  Left ear:           Vision Screening Comments: Patient states has not had eye examined in a few years. Patient wears glasses.   Dietary issues and exercise activities discussed: Current Exercise Habits: The patient does not participate in regular exercise at present, Exercise limited by: orthopedic condition(s)  Goals Addressed            This Visit's Progress   . DIET - EAT MORE FRUITS AND VEGETABLES      . DIET - REDUCE FAT INTAKE      . Weight (lb) < 200 lb (90.7 kg)        Depression Screen PHQ 2/9 Scores 08/21/2020 02/08/2020 02/08/2019 12/30/2017 07/05/2017 05/05/2017 06/25/2016  PHQ - 2 Score 0 0 0 0 _0 PHQ- 9 Score - - - - _1 Fall Risk Fall Risk  08/21/2020 02/08/2020 12/30/2017 09/25/2015  Falls in the past year? 1 1 Yes Yes  Number falls in past yr: 1 0 2 or more 2 or more  Injury with Fall? 1 1 Yes Yes  Comment - - hand fx -  Risk Factor Category  - - - High Fall Risk  Risk for fall due to : Medication side effect;Impaired balance/gait;Orthopedic patient - - -  Follow up Falls evaluation completed;Falls prevention discussed - - Falls evaluation completed    FALL RISK PREVENTION PERTAINING TO THE HOME:  Any stairs in or around the home? No  If so, are there any without handrails? No  Home free of loose throw rugs in walkways, pet beds, electrical cords, etc? Yes  Adequate lighting in your home to reduce risk of falls? Yes   ASSISTIVE DEVICES UTILIZED TO PREVENT FALLS:  Life alert? No  Use of a cane, walker or w/c? Yes  Grab bars in the bathroom? No  Shower chair or bench in shower? Yes  Elevated toilet seat or a handicapped toilet? Yes   Cognitive Function:   Normal cognitive status assessed by direct observation by this Nurse Health Advisor. No abnormalities found.     6CIT Screen 08/21/2020  What Year? 0 points  What month? 0 points  What time? 0 points   Count back from 20 0 points  Months in reverse 0 points  Repeat phrase 8 points  Total Score 8    Immunizations Immunization History  Administered Date(s) Administered  . Influenza Split 01/17/2014, 01/08/2018  . Influenza,inj,Quad PF,6+ Mos 02/10/2015, 05/05/2017, 12/21/2017, 02/08/2019, 02/08/2020  . Influenza,inj,quad, With Preservative 02/10/2015, 05/05/2017, 12/21/2017, 02/08/2019  . Influenza-Unspecified 01/17/2014, 01/08/2018  . Pneumococcal Polysaccharide-23 05/29/2015  . Tdap 02/20/2018    TDAP status: Up to date  Flu Vaccine status: Up to date  Pneumococcal vaccine status: Up to date  Covid-19 vaccine status: Declined, Education has been provided regarding the importance of this vaccine but patient still declined. Advised may receive this vaccine at local pharmacy or Health Dept.or vaccine clinic. Aware to provide a copy of the vaccination record if obtained from local pharmacy or Health Dept. Verbalized acceptance and understanding.  Qualifies for Shingles Vaccine? Yes   Zostavax completed No   Shingrix Completed?: No.    Education has been provided regarding the importance of this vaccine. Patient has been advised to call insurance company to determine out of pocket expense if they have not yet received this vaccine. Advised may also receive vaccine at local pharmacy or Health Dept. Verbalized acceptance and understanding.  Screening Tests Health Maintenance  Topic Date Due  . MAMMOGRAM  05/17/2019  . COVID-19 Vaccine (1) 09/06/2020 (Originally 10/14/1970)  . INFLUENZA VACCINE  11/17/2020  . COLONOSCOPY (Pts 45-8yr Insurance coverage will need to be confirmed)  04/19/2021  . TETANUS/TDAP  02/21/2028  . Hepatitis C Screening  Completed  . HIV Screening  Completed  . HPV VACCINES  Aged Out  . PAP SMEAR-Modifier  Discontinued    Health Maintenance  Health Maintenance Due  Topic Date Due  . MAMMOGRAM  05/17/2019    Colorectal cancer screening: Type of  screening: Colonoscopy. Completed 04/20/2011. Repeat every 10 years  Mammogram status: Ordered 02/08/2020. Pt provided with contact info and advised to call to schedule appt.   Bone Density Status: Not due until age 89  Lung Cancer Screening: (Low Dose CT Chest recommended if Age 57-80years, 30 pack-year currently smoking OR have quit w/in 15years.) does not qualify.   Lung Cancer Screening Referral: N/A   Additional Screening:  Hepatitis C Screening: does qualify; Completed 09/25/2015  Vision Screening: Recommended annual ophthalmology exams for early detection of glaucoma and other disorders of the eye. Is the patient up to date with their annual eye exam?  No  Who is the provider or what is the name of the office in which the patient attends annual eye exams? Dr. GKaty Fitch If pt is not established with a provider, would they like to be referred to a provider to establish care? No .   Dental Screening: Recommended annual dental exams for proper oral hygiene  Community Resource Referral / Chronic Care Management: CRR required this visit?  No   CCM required this visit?  No      Plan:     I have personally reviewed and noted the following in the patient's chart:   . Medical and social history . Use of alcohol, tobacco or illicit drugs  . Current medications and supplements including opioid prescriptions. Patient is currently taking opioid prescriptions. Information provided to patient regarding non-opioid alternatives. Patient advised to discuss non-opioid treatment plan with their provider. . Functional ability and status . Nutritional status . Physical activity . Advanced directives . List of other physicians . Hospitalizations, surgeries, and ER visits in previous 12 months . Vitals . Screenings to include cognitive, depression, and falls . Referrals and appointments  In addition, I have reviewed and discussed with patient certain preventive protocols, quality metrics,  and best practice recommendations. A written personalized care plan for preventive services as well as general preventive health recommendations were provided to patient.     SOfilia Neas LPN   [id number]  Nurse Notes: None

## 2020-08-22 ENCOUNTER — Other Ambulatory Visit: Payer: Self-pay | Admitting: Family Medicine

## 2020-08-26 ENCOUNTER — Encounter (HOSPITAL_BASED_OUTPATIENT_CLINIC_OR_DEPARTMENT_OTHER): Payer: 59 | Attending: Internal Medicine | Admitting: Internal Medicine

## 2020-08-26 ENCOUNTER — Other Ambulatory Visit: Payer: Self-pay

## 2020-08-26 DIAGNOSIS — G40909 Epilepsy, unspecified, not intractable, without status epilepticus: Secondary | ICD-10-CM | POA: Diagnosis not present

## 2020-08-26 DIAGNOSIS — G629 Polyneuropathy, unspecified: Secondary | ICD-10-CM | POA: Insufficient documentation

## 2020-08-26 DIAGNOSIS — L97512 Non-pressure chronic ulcer of other part of right foot with fat layer exposed: Secondary | ICD-10-CM | POA: Diagnosis not present

## 2020-08-26 DIAGNOSIS — G9009 Other idiopathic peripheral autonomic neuropathy: Secondary | ICD-10-CM | POA: Diagnosis not present

## 2020-08-26 DIAGNOSIS — I890 Lymphedema, not elsewhere classified: Secondary | ICD-10-CM | POA: Diagnosis not present

## 2020-08-26 NOTE — Progress Notes (Signed)
BROOKS, STOTZ ([id number]) Visit Report for 08/26/2020 HPI Details Patient Name: Date of Service: [name] [name], [name] [name] 08/26/2020 3:45 PM Medical Record Number: [id number] Patient Account Number: [id number] Date of Birth/Sex: Treating RN: October 09, 1965 (55 y.o. [name] Primary Care Provider: Jenna Luo Other Clinician: Referring Provider: Treating Provider/Extender: Rosezena Sensor in Treatment: 12 History of Present Illness HPI Description: ADMISSION 06/02/2020 This is a 55 year old woman who has had problems with wounds on her predominantly plantar right foot for quite a period of time. 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 More recently she has had an area developed in the right mid foot at roughly the third metatarsal head. She says this started as a blister of November. Its not been closing. More recently she has been using Silvadene cream she has a cam boot to offload. I note she walks with a cane The patient has had arterial studies done in 2019 at which time her ABI in the right was 1.28 with triphasic waveforms noncompressible on the left with triphasic waveform. Her ABI in our clinic today was 1.3 on the right. 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 She had a foot gastrocnemius recession sometime in the fall 2021 2/21; patient arrives today with a wound measuring slightly smaller 3 x 3 mm however there is circumferential area of erythema around this. She does not have any complaints 2/28; plantar wound on the right first transmetatarsal amputation site. Small oval-shaped wound raised skin around the edges. We have been using Hydrofera Blue. She does not have arterial issues 06/23/2020 upon evaluation today patient  appears to be doing decently well. With regard to her wound. There is little bit of slough and some hyper granulation. I do not feel like the PolyMem did too well for her. In fact she may do better with some Hydrofera Blue. I know we just switch from that but I feel like that may be a better way to continue currently. 3/15; this is a patient with a wound on the plantar right transmetatarsal amputation site probably the second metatarsal head not 1 July as stated previously we have been using polymen switch to [facility] last week. Wound looks about the same to me. We are going to start her on a total contact cast today we have the boot to fit 3/18; first total contact cast change she tolerated this well 3/31; she comes back in with out total contact cast on for 10 days. In spite of this the small wound over the third metatarsal head of her right transmetatarsal amputation site almost looks the same slightly hyper granulated. Circumference is not well adhered. We have been using Hydrofera Blue 4/7 she comes back in in her total contact cast. The wound is smaller epithelializing and looks like it on its way to closing. We have been using Hydrofera Blue over the wound area She has a right transmetatarsal amputation. She tells [place] that she has been to [facility] in the past and they did a modified shoe for her however it did not keep this area epithelialized. She is going to bring that in next week for [place] to look at. It does not sound however that she was really offloading or padding this area  at all 4/14; total contact cast. I changed her to calcium alginate today. I think most of the small area is epithelialized although there is clearly not complete epithelialization and even the area that is epithelialized and the small wound is very tiny She shows me her shoe with the insert. It looks as though the insert probably was too large for her transmet it foot allowing it to move back and  forward. I can see where the friction was in the blister she describes forming. I have asked her to take this back to [facility] to see if there is anything they can do 4/21; patient presents for her 1 week follow-up. She has been treated with a total contact cast weekly. She has no complaints today. She denies any fever/chills. 4/26; patient presents for 1 week follow-up in total contact cast change. Patient is having knee pain to her right knee due to chronic osteoarthritis. She is hoping to be able to wear her brace over the cast. 5/10; the wound is closed over however still looks vulnerable. Electronic Signature(s) Signed: 08/26/2020 4:50:17 PM By: Baltazar Najjar MD Entered By: Baltazar Najjar on 08/26/2020 16:31:22 -------------------------------------------------------------------------------- Physical Exam Details Patient Name: Date of Service: [name] [name] 08/26/2020 3:45 PM Medical Record Number: [id number] Patient Account Number: [id number] Date of Birth/Sex: Treating RN: Apr 15, 1966 (55 y.o. Arta Silence Primary Care Provider: Lynnea Ferrier Other Clinician: Referring Provider: Treating Provider/Extender: Princella Pellegrini in Treatment: 12 Constitutional Sitting or standing Blood Pressure is within target range for patient.. Pulse regular and within target range for patient.Marland Kitchen Respirations regular, non-labored and within target range.. Temperature is normal and within the target range for the patient.Marland Kitchen Appears in no distress. Notes Wound exam; right foot this is epithelialized but still looks vulnerable. I am going to give this another week in the cast and then transition her into her footwear next time. No evidence of infection Electronic Signature(s) Signed: 08/26/2020 4:50:17 PM By: Baltazar Najjar MD Entered By: Baltazar Najjar on 08/26/2020  16:32:08 -------------------------------------------------------------------------------- Physician Orders Details Patient Name: Date of Service: [name] [name] 08/26/2020 3:45 PM Medical Record Number: [id number] Patient Account Number: [id number] Date of Birth/Sex: Treating RN: April 08, 1966 (55 y.o. 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 - with Dr. Leanord Hawking Return A Bathing/ Shower/ Hygiene May shower with protection but do not get wound dressing(s) wet. Off-Loading Total Contact Cast to Right Lower Extremity Wound Treatment Wound #1 - Amputation Site - Transmetatarsal Wound Laterality: Plantar, Right Cleanser: Soap and Water 1 x Per Week/15 Days Discharge Instructions: May shower and wash wound with dial antibacterial soap and water prior to dressing change. Secondary Dressing: Woven Gauze Sponges 2x2 in (Generic) 1 x Per Week/15 Days Discharge Instructions: Apply over primary dressing as directed. Secured With: 26M Medipore H Soft Cloth Surgical T 4 x 2 (in/yd) 1 x Per Week/15 Days ape Discharge Instructions: Secure dressing with tape as directed. Electronic Signature(s) Signed: 08/26/2020 4:50:17 PM By: Baltazar Najjar MD Signed: 08/26/2020 6:10:24 PM By: Shawn Stall Entered By: Shawn Stall on 08/26/2020 15:59:46 -------------------------------------------------------------------------------- Problem List Details Patient Name: Date of Service: [name] [name] 08/26/2020 3:45 PM Medical Record Number: [id number] Patient Account  Number: [id number] Date of Birth/Sex: Treating  RN: 02-Jun-1965 (55 y.o. Debara Pickett, Yvonne Kendall Primary Care Provider: Lynnea Ferrier Other Clinician: Referring Provider: Treating Provider/Extender: Princella Pellegrini in Treatment: 12 Active Problems ICD-10 Encounter Code Description Active Date MDM Diagnosis [phone] Non-pressure chronic ulcer of other part of right foot with fat layer exposed 06/02/2020 No Yes G90.09 Other idiopathic peripheral autonomic neuropathy 06/02/2020 No Yes Inactive Problems ICD-10 Code Description Active Date Inactive Date L03.115 Cellulitis of right lower limb 06/09/2020 06/09/2020 Resolved Problems Electronic Signature(s) Signed: 08/26/2020 4:50:17 PM By: Baltazar Najjar MD Entered By: Baltazar Najjar on 08/26/2020 16:30:11 -------------------------------------------------------------------------------- Progress Note Details Patient Name: Date of Service: [name] [name] 08/26/2020 3:45 PM Medical Record Number: [id number] Patient Account Number: [id number] Date of Birth/Sex: Treating RN: [birth year] (55 y.o. Arta Silence Primary Care Provider: Lynnea Ferrier Other Clinician: Referring Provider: Treating Provider/Extender: Princella Pellegrini in Treatment: 12 Subjective History of Present Illness (HPI) ADMISSION 06/02/2020 This is a 55 year old woman who has had problems with wounds on her predominantly plantar right foot for quite a period of time. 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 More recently she has had an area developed in the right mid foot at roughly the third metatarsal head. She says this started as a blister of November. Its not been closing. More recently she has been using Silvadene cream she has a cam boot to offload. I note she walks with a cane The patient has had arterial studies done in 2019 at which time her ABI in the right was  1.28 with triphasic waveforms noncompressible on the left with triphasic waveform. Her ABI in our clinic today was 1.3 on the right. 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 She had a foot gastrocnemius recession sometime in the fall 2021 2/21; patient arrives today with a wound measuring slightly smaller 3 x 3 mm however there is circumferential area of erythema around this. She does not have any complaints 2/28; plantar wound on the right first transmetatarsal amputation site. Small oval-shaped wound raised skin around the edges. We have been using Hydrofera Blue. She does not have arterial issues 06/23/2020 upon evaluation today patient appears to be doing decently well. With regard to her wound. There is little bit of slough and some hyper granulation. I do not feel like the PolyMem did too well for her. In fact she may do better with some Hydrofera Blue. I know we just switch from that but I feel like that may be a better way to continue currently. 3/15; this is a patient with a wound on the plantar right transmetatarsal amputation site probably the second metatarsal head not 1 July as stated previously we have been using polymen switch to [facility] last week. Wound looks about the same to me. We are going to start her on a total contact cast today we have the boot to fit 3/18; first total contact cast change she tolerated this well 3/31; she comes back in with out total contact cast on for 10 days. In spite of this the small wound over the third metatarsal head of her right transmetatarsal amputation site almost looks the same slightly hyper granulated. Circumference is not well adhered. We have been using Hydrofera Blue  4/7 she comes back in in her total contact cast. The wound is smaller epithelializing and looks like it on its way to closing. We have been using Hydrofera  Blue over the wound area She has a right transmetatarsal amputation. She tells [place] that she has been to [facility] in the past and they did a modified shoe for her however it did not keep this area epithelialized. She is going to bring that in next week for [place] to look at. It does not sound however that she was really offloading or padding this area at all 4/14; total contact cast. I changed her to calcium alginate today. I think most of the small area is epithelialized although there is clearly not complete epithelialization and even the area that is epithelialized and the small wound is very tiny She shows me her shoe with the insert. It looks as though the insert probably was too large for her transmet it foot allowing it to move back and forward. I can see where the friction was in the blister she describes forming. I have asked her to take this back to [facility] to see if there is anything they can do 4/21; patient presents for her 1 week follow-up. She has been treated with a total contact cast weekly. She has no complaints today. She denies any fever/chills. 4/26; patient presents for 1 week follow-up in total contact cast change. Patient is having knee pain to her right knee due to chronic osteoarthritis. She is hoping to be able to wear her brace over the cast. 5/10; the wound is closed over however still looks vulnerable. Objective Constitutional Sitting or standing Blood Pressure is within target range for patient.. Pulse regular and within target range for patient.Marland Kitchen Respirations regular, non-labored and within target range.. Temperature is normal and within the target range for the patient.Marland Kitchen Appears in no distress. Vitals Time Taken: 3:45 PM, Height: 64 in, Weight: 228 lbs, BMI: 39.1, Temperature: 98.4 F, Pulse: 91 bpm, Respiratory Rate: 18 breaths/min, Blood Pressure: 121/81 mmHg. General Notes: Wound exam; right foot this is epithelialized but still looks  vulnerable. I am going to give this another week in the cast and then transition her into her footwear next time. No evidence of infection Integumentary (Hair, Skin) Wound #1 status is Open. Original cause of wound was Gradually Appeared. The date acquired was: 03/05/2020. The wound has been in treatment 12 weeks. The wound is located on the Right,Plantar Amputation Site - Transmetatarsal. The wound measures 0cm length x 0cm width x 0cm depth; 0cm^2 area and 0cm^3 volume. Assessment Active Problems ICD-10 Non-pressure chronic ulcer of other part of right foot with fat layer exposed Other idiopathic peripheral autonomic neuropathy Procedures Wound #1 Pre-procedure diagnosis of Wound #1 is a Neuropathic Ulcer-Non Diabetic located on the Right,Plantar Amputation Site - Transmetatarsal . There was a Total Contact Cast Procedure by Ricard Dillon., MD. Post procedure Diagnosis Wound #1: Same as Pre-Procedure Plan Follow-up Appointments: Return Appointment in 1 week. - with Dr. Arcola Jansky Shower/ Hygiene: May shower with protection but do not get wound dressing(s) wet. Off-Loading: T Contact Cast to Right Lower Extremity otal WOUND #1: - Amputation Site - Transmetatarsal Wound Laterality: Plantar, Right Cleanser: Soap and Water 1 x Per Week/15 Days Discharge Instructions: May shower and wash wound with dial antibacterial soap and water prior to dressing change. Secondary Dressing: Woven Gauze Sponges 2x2 in (Generic) 1 x Per Week/15 Days Discharge Instructions: Apply over primary  dressing as directed. Secured With: 77M Medipore H Soft Cloth Surgical T 4 x 2 (in/yd) 1 x Per Week/15 Days ape Discharge Instructions: Secure dressing with tape as directed. 1. We replaced the total contact cast but I think we should be able to heal this out . Simply look too vulnerable to the Electronic Signature(s) Signed: 08/26/2020 4:50:17 PM By: Linton Ham MD Entered By: Linton Ham on  08/26/2020 16:32:57 -------------------------------------------------------------------------------- Total Contact Cast Details Patient Name: Date of Service: [name] 08/26/2020 3:45 PM Medical Record Number: [id number] Patient Account Number: [id number] Date of Birth/Sex: Treating RN: 07/25/1965 (55 y.o. [name] Primary Care Provider: Jenna Luo Other Clinician: Referring Provider: Treating Provider/Extender: Rosezena Sensor in Treatment: 12 T Contact Cast Applied for Wound Assessment: otal Wound #1 Right,Plantar Amputation Site - Transmetatarsal Performed By: Physician Ricard Dillon., MD Post Procedure Diagnosis Same as Pre-procedure Electronic Signature(s) Signed: 08/26/2020 4:50:17 PM By: Linton Ham MD Entered By: Linton Ham on 08/26/2020 16:30:33 -------------------------------------------------------------------------------- SuperBill Details Patient Name: Date of Service: [name] 08/26/2020 Medical Record Number: [id number] Patient Account Number: [id number] Date of Birth/Sex: Treating RN: 09-30-1965 (54 y.o. [name] Primary Care Provider: Jenna Luo Other Clinician: Referring Provider: Treating Provider/Extender: Rosezena Sensor in Treatment: 12 Diagnosis Coding ICD-10 Codes Code Description [phone] Non-pressure chronic ulcer of other part of right foot with fat layer exposed G90.09 Other idiopathic peripheral autonomic neuropathy Facility Procedures CPT4 Code: [id number] Description: [phone] - APPLY TOTAL CONTACT LEG CAST ICD-10 Diagnosis Description L97.512 Non-pressure chronic ulcer of other part of right foot with fat layer exposed Modifier: Quantity: 1 Physician Procedures : CPT4 Code Description Modifier [phone] [id number] - WC PHYS APPLY TOTAL CONTACT CAST ICD-10 Diagnosis Description L97.512 Non-pressure chronic ulcer of other part of right foot with fat layer  exposed Quantity: 1 Electronic Signature(s) Signed: 08/26/2020 4:50:17 PM By: Linton Ham MD Entered By: Linton Ham on 08/26/2020 16:33:09

## 2020-08-27 NOTE — Progress Notes (Signed)
TENNILLE, [name] [name] ([id number]) Visit Report for 08/26/2020 Arrival Information Details Patient Name: Date of Service: [name] [name], [name] [name] 08/26/2020 3:45 PM Medical Record Number: [id number] Patient Account Number: [id number] Date of Birth/Sex: Treating RN: 1966/04/12 (55 y.o. [name] Primary Care Chandrea Zellman: Jenna Luo Other Clinician: Referring Secilia Apps: Treating Pamela Intrieri/Extender: Rosezena Sensor in Treatment: 12 Visit Information History Since Last Visit Added or deleted any medications: No Patient Arrived: Ambulatory Any new allergies or adverse reactions: No Arrival Time: 15:46 Had a fall or experienced change in No Transfer Assistance: None activities of daily living that may affect Patient Identification Verified: Yes risk of falls: Secondary Verification Process Completed: Yes Signs or symptoms of abuse/neglect since last visito No Patient Requires Transmission-Based Precautions: No Hospitalized since last visit: No Patient Has Alerts: No Implantable device outside of the clinic excluding No cellular tissue based products placed in the center since last visit: Has Dressing in Place as Prescribed: Yes Has Footwear/Offloading in Place as Prescribed: Yes Right: T Contact Cast otal Pain Present Now: No Electronic Signature(s) Signed: 08/26/2020 5:40:35 PM By: Lorrin Jackson Entered By: Lorrin Jackson on 08/26/2020 15:47:24 -------------------------------------------------------------------------------- Encounter Discharge Information Details Patient Name: Date of Service: [name] Shutter D. 08/26/2020 3:45 PM Medical Record Number: [id number] Patient Account Number: [id number] Date of Birth/Sex: Treating RN: Nov 19, 1965 (55 y.o. [name] Primary Care Tayquan Gassman: Jenna Luo Other Clinician: Referring Daci Stubbe: Treating Wyett Narine/Extender: Rosezena Sensor in Treatment: 12 Encounter Discharge Information  Items Discharge Condition: Stable Ambulatory Status: Ambulatory Discharge Destination: Home Transportation: Private Auto Schedule Follow-up Appointment: Yes Clinical Summary of Care: Provided on 08/26/2020 Form Type Recipient Paper Patient Patient Electronic Signature(s) Signed: 08/26/2020 4:17:40 PM By: Lorrin Jackson Entered By: Lorrin Jackson on 08/26/2020 16:17:40 -------------------------------------------------------------------------------- Lower Extremity Assessment Details Patient Name: Date of Service: [name] [name], [name] [name] 08/26/2020 3:45 PM Medical Record Number: [id number] Patient Account Number: [id number] Date of Birth/Sex: Treating RN: 12/28/1965 (55 y.o. [name] Primary Care Crystle Carelli: Jenna Luo Other Clinician: Referring Raychel Dowler: Treating Lindsey Demonte/Extender: Rosezena Sensor in Treatment: 12 Edema Assessment Assessed: Shirlyn Goltz: No] Patrice Paradise: Yes] Edema: [Left: N] [Right: o] Calf Left: Right: Point of Measurement: 34 cm From Medial Instep 34 cm Ankle Left: Right: Point of Measurement: 9 cm From Medial Instep 23 cm Vascular Assessment Pulses: Dorsalis Pedis Palpable: [Right:Yes] Electronic Signature(s) Signed: 08/26/2020 5:40:35 PM By: Lorrin Jackson Entered By: Lorrin Jackson on 08/26/2020 15:48:08 -------------------------------------------------------------------------------- Multi Wound Chart Details Patient Name: Date of Service: [name] [name] 08/26/2020 3:45 PM Medical Record Number: [id number] Patient Account Number: [id number] Date of Birth/Sex: Treating RN: Mar 25, 1966 (55 y.o. 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] [N/A:N/A] Primary Etiology: [1:Asthma, Sleep Apnea, Hypertension,] [N/A:N/A] Comorbid History: [1:Osteoarthritis, Neuropathy, Seizure Disorder 03/05/2020] [N/A:N/A] Date Acquired: [1:12] [N/A:N/A] Weeks of Treatment: [1:Open] [N/A:N/A] Wound Status: [1:0x0x0] [N/A:N/A] Measurements L x W x D (cm) [1:0] [N/A:N/A] A (cm) : rea [1:0] [N/A:N/A] Volume (cm) : [1:100.00%] [N/A:N/A] % Reduction in Area: [1:100.00%] [N/A:N/A] % Reduction in Volume: [1:Full Thickness Without Exposed] [N/A:N/A] Classification: [1:Support Structures T Contact Cast otal] [N/A:N/A] Treatment Notes Wound #1 (Amputation Site - Transmetatarsal) Wound Laterality: Plantar, Right Cleanser Soap and Water Discharge  Instruction: May shower and wash wound with dial antibacterial soap and water prior to dressing change. Peri-Wound Care Topical Primary Dressing Secondary Dressing Woven Gauze Sponges 2x2 in Discharge Instruction: Apply over primary dressing as directed. Secured With 45M Medipore H Soft Cloth Surgical T 4 x 2 (in/yd) ape Discharge Instruction: Secure dressing with tape as directed. Compression Wrap Compression Stockings Add-Ons Electronic Signature(s) Signed: 08/26/2020 4:50:17 PM By: Linton Ham MD Signed: 08/26/2020 6:10:24 PM By: Deon Pilling Entered By: Linton Ham on 08/26/2020 16:30:22 -------------------------------------------------------------------------------- Multi-Disciplinary Care Plan Details Patient Name: Date of Service: [name] [name], [name] [name] 08/26/2020 3:45 PM Medical Record Number: [id number] Patient Account Number: [id number] Date of Birth/Sex: Treating RN: [birth year] (55 y.o. [name] Primary Care Harli Engelken: Jenna Luo Other Clinician: Referring  Lavonta Tillis: Treating Hurshel Bouillon/Extender: Rosezena Sensor in Treatment: Amanda reviewed with physician Active Inactive Wound/Skin Impairment Nursing Diagnoses: Impaired tissue integrity Knowledge deficit related to ulceration/compromised skin integrity Goals: Patient/caregiver will verbalize understanding of skin care regimen Date Initiated: 06/02/2020 Target Resolution Date: 09/12/2020 Goal Status: Active Ulcer/skin breakdown will have a volume reduction of 30% by week 4 Date Initiated: 06/02/2020 Date Inactivated: 07/17/2020 Target Resolution Date: 07/04/2020 Unmet Reason: no major change see Goal Status: Unmet measurements. Interventions: Assess patient/caregiver ability to obtain necessary supplies Assess patient/caregiver ability to perform ulcer/skin care regimen upon admission and as needed Assess ulceration(s) every visit Provide education on ulcer and skin care Notes: Electronic Signature(s) Signed: 08/26/2020 6:10:24 PM By: Deon Pilling Entered By: Deon Pilling on 08/26/2020 16:00:01 -------------------------------------------------------------------------------- Pain Assessment Details Patient Name: Date of Service: [name] [name], [name] [name] 08/26/2020 3:45 PM Medical Record Number: [id number] Patient Account Number: [id number] Date of Birth/Sex: Treating RN: 1966-03-27 (55 y.o. [name] Primary Care Kaydan Wilhoite: Jenna Luo Other Clinician: Referring Marit Goodwill: Treating Lelend Heinecke/Extender: Rosezena Sensor in Treatment: 12 Active Problems Location of Pain Severity and Description of Pain Patient Has Paino No Site Locations Pain Management and Medication Current Pain Management: Electronic Signature(s) Signed: 08/26/2020 5:40:35 PM By: Lorrin Jackson Entered By: Lorrin Jackson on 08/26/2020  15:47:56 -------------------------------------------------------------------------------- Patient/Caregiver Education Details Patient Name: Date of Service: [name] [name] 5/10/2022andnbsp3:45 PM Medical Record Number: [id number] Patient Account Number: [id number] Date of Birth/Gender: Treating RN: 1965/07/06 (55 y.o. [name] Primary Care Physician: Jenna Luo Other Clinician: Referring Physician: Treating Physician/Extender: Rosezena Sensor in Treatment: 12 Education Assessment Education Provided To: Patient Education Topics Provided Wound/Skin Impairment: Handouts: Skin Care Do's and Dont's Methods: Explain/Verbal Responses: Reinforcements needed Electronic Signature(s) Signed: 08/26/2020 6:10:24 PM By: Deon Pilling Entered By: Deon Pilling on 08/26/2020 16:00:14 -------------------------------------------------------------------------------- Wound Assessment Details Patient Name: Date of Service: [name] [name], [name] [name] 08/26/2020 3:45 PM Medical Record Number: [id number] Patient Account Number: [id number] Date of Birth/Sex: Treating RN: 1965/07/02 (55 y.o. 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, Plantar Amputation Site - Transmetatarsal Wound Open Wounding Event: Gradually Appeared Status: Date Acquired: 03/05/2020 Comorbid Asthma, Sleep Apnea, Hypertension, Osteoarthritis, Weeks Of Treatment: 12 History: Neuropathy, Seizure Disorder Clustered Wound: No Photos Wound Measurements Length: (cm) Width: (cm) Depth: (cm) Area: (cm) Volume: (cm) 0 % Reduction in Area: 100% 0 % Reduction in Volume: 100% 0 0 0 Wound Description Classification: Full Thickness Without Exposed Support  Structur Treatment Notes Wound #1 (Amputation Site - Transmetatarsal) Wound Laterality: P Cleanser Soap and Water Discharge Instruction:  May shower and wash wound wi es lantar, Right th dial antibacterial soap and water prior to dressing change. Peri-Wound Care Topical Primary Dressing Secondary Dressing Woven Gauze Sponges 2x2 in Discharge Instruction: Apply over primary dressing as directed. Secured With 10M Medipore H Soft Cloth Surgical T 4 x 2 (in/yd) ape Discharge Instruction: Secure dressing with tape as directed. Compression Wrap Compression Stockings Add-Ons Electronic Signature(s) Signed: 08/26/2020 5:40:35 PM By: Lorrin Jackson Signed: 08/27/2020 9:46:23 AM By: Sandre Kitty Entered By: Sandre Kitty on 08/26/2020 16:45:03 -------------------------------------------------------------------------------- Vitals Details Patient Name: Date of Service: [name] Shutter D. 08/26/2020 3:45 PM Medical Record Number: [id number] Patient Account Number: [id number] Date of Birth/Sex: Treating RN: Sep 25, 1965 (55 y.o. [name] Primary Care Roneka Gilpin: Jenna Luo Other Clinician: Referring Lando Alcalde: Treating Keeven Matty/Extender: Rosezena Sensor in Treatment: 12 Vital Signs Time Taken: 15:45 Temperature (F): 98.4 Height (in): 64 Pulse (bpm): 91 Weight (lbs): 228 Respiratory Rate (breaths/min): 18 Body Mass Index (BMI): 39.1 Blood Pressure (mmHg): 121/81 Reference Range: 80 - 120 mg / dl Electronic Signature(s) Signed: 08/26/2020 5:40:35 PM By: Lorrin Jackson Entered By: Lorrin Jackson on 08/26/2020 15:47:48

## 2020-09-02 ENCOUNTER — Encounter (HOSPITAL_BASED_OUTPATIENT_CLINIC_OR_DEPARTMENT_OTHER): Payer: 59 | Admitting: Internal Medicine

## 2020-09-02 ENCOUNTER — Other Ambulatory Visit: Payer: Self-pay

## 2020-09-02 DIAGNOSIS — L97512 Non-pressure chronic ulcer of other part of right foot with fat layer exposed: Secondary | ICD-10-CM | POA: Diagnosis not present

## 2020-09-02 NOTE — Progress Notes (Signed)
[name] [name], [name] [name] ([id number]) Visit Report for 09/02/2020 HPI Details Patient Name: Date of Service: [name] [name], [name] [name] 09/02/2020 3:45 PM Medical Record Number: [id number] Patient Account Number: [id number] Date of Birth/Sex: Treating RN: 25-Dec-1965 (55 y.o. Tonita Phoenix, Lauren Primary Care Provider: Jenna Luo Other Clinician: Referring Provider: Treating Provider/Extender: Rosezena Sensor in Treatment: 13 History of Present Illness HPI Description: ADMISSION 06/02/2020 This is a 55 year old woman who has had problems with wounds on her predominantly plantar right foot for quite a period of time. 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 More recently she has had an area developed in the right mid foot at roughly the third metatarsal head. She says this started as a blister of November. Its not been closing. More recently she has been using Silvadene cream she has a cam boot to offload. I note she walks with a cane The patient has had arterial studies done in 2019 at which time her ABI in the right was 1.28 with triphasic waveforms noncompressible on the left with triphasic waveform. Her ABI in our clinic today was 1.3 on the right. 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 She had a foot gastrocnemius recession sometime in the fall 2021 2/21; patient arrives today with a wound measuring slightly smaller 3 x 3 mm however there is circumferential area of erythema around this. She does not have any complaints 2/28; plantar wound on the right first transmetatarsal amputation site. Small oval-shaped wound raised skin around the edges. We have been using Hydrofera Blue. She does not have arterial issues 06/23/2020 upon evaluation today patient  appears to be doing decently well. With regard to her wound. There is little bit of slough and some hyper granulation. I do not feel like the PolyMem did too well for her. In fact she may do better with some Hydrofera Blue. I know we just switch from that but I feel like that may be a better way to continue currently. 3/15; this is a patient with a wound on the plantar right transmetatarsal amputation site probably the second metatarsal head not 1 July as stated previously we have been using polymen switch to [facility] last week. Wound looks about the same to me. We are going to start her on a total contact cast today we have the boot to fit 3/18; first total contact cast change she tolerated this well 3/31; she comes back in with out total contact cast on for 10 days. In spite of this the small wound over the third metatarsal head of her right transmetatarsal amputation site almost looks the same slightly hyper granulated. Circumference is not well adhered. We have been using Hydrofera Blue 4/7 she comes back in in her total contact cast. The wound is smaller epithelializing and looks like it on its way to closing. We have been using Hydrofera Blue over the wound area She has a right transmetatarsal amputation. She tells [place] that she has been to [facility] in the past and they did a modified shoe for her however it did not keep this area epithelialized. She is going to bring that in next week for [place] to look at. It does not sound however that she was really offloading or padding this area  at all 4/14; total contact cast. I changed her to calcium alginate today. I think most of the small area is epithelialized although there is clearly not complete epithelialization and even the area that is epithelialized and the small wound is very tiny She shows me her shoe with the insert. It looks as though the insert probably was too large for her transmet it foot allowing it to move back and  forward. I can see where the friction was in the blister she describes forming. I have asked her to take this back to [facility] to see if there is anything they can do 4/21; patient presents for her 1 week follow-up. She has been treated with a total contact cast weekly. She has no complaints today. She denies any fever/chills. 4/26; patient presents for 1 week follow-up in total contact cast change. Patient is having knee pain to her right knee due to chronic osteoarthritis. She is hoping to be able to wear her brace over the cast. 5/10; the wound is closed over however still looks vulnerable. 5/17; the wound remains closed surface looks better. She has a previous right transmet. She is going to convert this into a sneaker. She had a forefoot prosthesis for the foot but not sure that that are too much good she is supposed to have taken aback from modification however she can do that now. Electronic Signature(s) Signed: 09/02/2020 4:16:43 PM By: Linton Ham MD Entered By: Linton Ham on 09/02/2020 16:08:01 -------------------------------------------------------------------------------- Physical Exam Details Patient Name: Date of Service: [name] [name] 09/02/2020 3:45 PM Medical Record Number: [id number] Patient Account Number: [id number] Date of Birth/Sex: Treating RN: 1965-12-07 (55 y.o. [name] [name] Primary Care Provider: Jenna Luo Other Clinician: Referring Provider: Treating Provider/Extender: Rosezena Sensor in Treatment: 13 Constitutional Sitting or standing Blood Pressure is within target range for patient.. Pulse regular and within target range for patient.Marland Kitchen Respirations regular, non-labored and within target range.. Temperature is normal and within the target range for the patient.Marland Kitchen Appears in no distress. Cardiovascular Pedal pulses are palpable on the right. Notes Wound exam; right foot this is epithelialized looks  better than last week. The patient can graduate from the total contact cast. No evidence of surrounding infection Electronic Signature(s) Signed: 09/02/2020 4:16:43 PM By: Linton Ham MD Entered By: Linton Ham on 09/02/2020 16:11:20 -------------------------------------------------------------------------------- Physician Orders Details Patient Name: Date of Service: [name] [name] 09/02/2020 3:45 PM Medical Record Number: [id number] Patient Account Number: [id number] Date of Birth/Sex: Treating RN: March 29, 1966 (55 y.o. [name] [name] Primary Care Provider: Jenna Luo Other Clinician: Referring Provider: Treating Provider/Extender: Rosezena Sensor in Treatment: 13 Verbal / Phone Orders: No Diagnosis Coding ICD-10 Coding Code Description L97.512 Non-pressure chronic ulcer of other part of right foot with fat layer exposed G90.09 Other idiopathic peripheral autonomic neuropathy Discharge From [facility] Services Discharge from [place] Edema Control - Lymphedema / SCD / Other Bilateral Lower Extremities Moisturize legs daily. - and feet nightly Off-Loading Removable cast walker boot to: - right foot to ambulate Other: - callous cushion to bottom of right foot daily Electronic Signature(s) Signed: 09/02/2020 4:16:43 PM By: Linton Ham MD Signed: 09/02/2020 5:35:12 PM By: Baruch Gouty RN, BSN Entered By: Baruch Gouty on 09/02/2020 16:12:13 -------------------------------------------------------------------------------- Problem List Details Patient Name: Date of Service: [name] Shutter D. 09/02/2020 3:45 PM Medical Record Number: [id number] Patient Account Number: [id number] Date of Birth/Sex: Treating RN: [birth year] (55 y.o. [name] [name] Primary Care Provider: Dennard Schaumann,  Cletus Gash Other Clinician: Referring Provider: Treating Provider/Extender: Rosezena Sensor in Treatment: 13 Active  Problems ICD-10 Encounter Code Description Active Date MDM Diagnosis L97.512 Non-pressure chronic ulcer of other part of right foot with fat layer exposed 06/02/2020 No Yes G90.09 Other idiopathic peripheral autonomic neuropathy 06/02/2020 No Yes Inactive Problems ICD-10 Code Description Active Date Inactive Date L03.115 Cellulitis of right lower limb 06/09/2020 06/09/2020 Resolved Problems Electronic Signature(s) Signed: 09/02/2020 4:16:43 PM By: Linton Ham MD Entered By: Linton Ham on 09/02/2020 16:06:58 -------------------------------------------------------------------------------- Progress Note Details Patient Name: Date of Service: [name] Shutter D. 09/02/2020 3:45 PM Medical Record Number: [id number] Patient Account Number: [id number] Date of Birth/Sex: Treating RN: [birth year] (55 y.o. Tonita Phoenix, Lauren Primary Care Provider: Jenna Luo Other Clinician: Referring Provider: Treating Provider/Extender: Rosezena Sensor in Treatment: 13 Subjective History of Present Illness (HPI) ADMISSION 06/02/2020 This is a 55 year old woman who has had problems with wounds on her predominantly plantar right foot for quite a period of time. 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 More recently she has had an area developed in the right mid foot at roughly the third metatarsal head. She says this started as a blister of November. Its not been closing. More recently she has been using Silvadene cream she has a cam boot to offload. I note she walks with a cane The patient has had arterial studies done in 2019 at which time her ABI in the right was 1.28 with triphasic waveforms noncompressible on the left with triphasic waveform. Her ABI in our clinic today was 1.3 on the right. 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 She had a foot gastrocnemius recession sometime in the fall 2021 2/21; patient arrives today with a wound measuring slightly smaller 3 x 3 mm however there is circumferential area of erythema around this. She does not have any complaints 2/28; plantar wound on the right first transmetatarsal amputation site. Small oval-shaped wound raised skin around the edges. We have been using Hydrofera Blue. She does not have arterial issues 06/23/2020 upon evaluation today patient appears to be doing decently well. With regard to her wound. There is little bit of slough and some hyper granulation. I do not feel like the PolyMem did too well for her. In fact she may do better with some Hydrofera Blue. I know we just switch from that but I feel like that may be a better way to continue currently. 3/15; this is a patient with a wound on the plantar right transmetatarsal amputation site probably the second metatarsal head not 1 July as stated previously we have been using polymen switch to [facility] last week. Wound looks about the same to me. We are going to start her on a total contact cast today we have the boot to fit 3/18; first total contact cast change she tolerated this well 3/31; she comes back in with out total contact cast on for 10 days. In spite of this the small wound over the third metatarsal head of her right transmetatarsal amputation site almost looks the same slightly hyper granulated. Circumference is not well adhered. We have been using Hydrofera Blue 4/7 she comes back in in her total contact cast. The wound is smaller epithelializing and looks  like it on its way to closing. We have been using Hydrofera Blue over the wound area She has a right transmetatarsal amputation. She tells [place] that she has been to [facility] in the past and they did a modified shoe for her however it did not keep this  area epithelialized. She is going to bring that in next week for [place] to look at. It does not sound however that she was really offloading or padding this area at all 4/14; total contact cast. I changed her to calcium alginate today. I think most of the small area is epithelialized although there is clearly not complete epithelialization and even the area that is epithelialized and the small wound is very tiny She shows me her shoe with the insert. It looks as though the insert probably was too large for her transmet it foot allowing it to move back and forward. I can see where the friction was in the blister she describes forming. I have asked her to take this back to [facility] to see if there is anything they can do 4/21; patient presents for her 1 week follow-up. She has been treated with a total contact cast weekly. She has no complaints today. She denies any fever/chills. 4/26; patient presents for 1 week follow-up in total contact cast change. Patient is having knee pain to her right knee due to chronic osteoarthritis. She is hoping to be able to wear her brace over the cast. 5/10; the wound is closed over however still looks vulnerable. 5/17; the wound remains closed surface looks better. She has a previous right transmet. She is going to convert this into a sneaker. She had a forefoot prosthesis for the foot but not sure that that are too much good she is supposed to have taken aback from modification however she can do that now. Objective Constitutional Sitting or standing Blood Pressure is within target range for patient.. Pulse regular and within target range for patient.Marland Kitchen Respirations regular, non-labored and within target range.. Temperature is normal and within the target range for the patient.Marland Kitchen Appears in no distress. Vitals Time Taken: 3:41 PM, Height: 64 in, Weight: 228 lbs, BMI: 39.1, Temperature: 98.4 F, Pulse: 83 bpm, Respiratory Rate: 18 breaths/min, Blood  Pressure: 119/82 mmHg. Cardiovascular Pedal pulses are palpable on the right. General Notes: Wound exam; right foot this is epithelialized looks better than last week. The patient can graduate from the total contact cast. No evidence of surrounding infection Integumentary (Hair, Skin) Wound #1 status is Open. Original cause of wound was Gradually Appeared. The date acquired was: 03/05/2020. The wound has been in treatment 13 weeks. The wound is located on the Right,Plantar Amputation Site - Transmetatarsal. The wound measures 0cm length x 0cm width x 0cm depth; 0cm^2 area and 0cm^3 volume. Assessment Active Problems ICD-10 Non-pressure chronic ulcer of other part of right foot with fat layer exposed Other idiopathic peripheral autonomic neuropathy Plan 1. We advised putting callus pads over this area with foam cover 2. Insole for her shoe 3. The patient can be discharged from the clinic. She is going to go to have her forefoot prosthesis modified although truthfully I do not think this was what was behind her wound which is more on the plantar foot at approximately the second met head Electronic Signature(s) Signed: 09/02/2020 4:16:43 PM By: Linton Ham MD Entered By: Linton Ham on 09/02/2020 16:12:20 -------------------------------------------------------------------------------- SuperBill Details Patient Name: Date of Service: [name] Shutter D. 09/02/2020 Medical Record  Number: [id number] Patient Account Number: [id number] Date of Birth/Sex: Treating RN: November 14, 1965 (55 y.o. [name] [name] Primary Care Provider: Jenna Luo Other Clinician: Referring Provider: Treating Provider/Extender: Rosezena Sensor in Treatment: 13 Diagnosis Coding ICD-10 Codes Code Description [phone] Non-pressure chronic ulcer of other part of right foot with fat layer exposed G90.09 Other idiopathic peripheral autonomic neuropathy Facility Procedures CPT4 Code:  [id number] Description: 99213 - WOUND CARE VISIT-LEV 3 EST PT Modifier: Quantity: 1 Physician Procedures : CPT4 Code Description Modifier [id number] - WC PHYS LEVEL 2 - EST PT ICD-10 Diagnosis Description L97.512 Non-pressure chronic ulcer of other part of right foot with fat layer exposed G90.09 Other idiopathic peripheral autonomic neuropathy Quantity: 1 Electronic Signature(s) Signed: 09/02/2020 4:16:43 PM By: Linton Ham MD Entered By: Linton Ham on 09/02/2020 16:12:38

## 2020-09-03 NOTE — Progress Notes (Signed)
[name] [name], [name] [name] ([id number]) Visit Report for 09/02/2020 Arrival Information Details Patient Name: Date of Service: [name] [name], [name] [name] 09/02/2020 3:45 PM Medical Record Number: [id number] Patient Account Number: [id number] Date of Birth/Sex: Treating RN: 15-Jul-1965 (55 y.o. Tonita Phoenix, Lauren Primary Care Andreana Klingerman: Jenna Luo Other Clinician: Referring Erienne Spelman: Treating Adia Crammer/Extender: Rosezena Sensor in Treatment: 64 Visit Information History Since Last Visit Added or deleted any medications: No Patient Arrived: Ambulatory Any new allergies or adverse reactions: No Arrival Time: 15:39 Had a fall or experienced change in No Accompanied By: self activities of daily living that may affect Transfer Assistance: None risk of falls: Patient Identification Verified: Yes Signs or symptoms of abuse/neglect since last visito No Secondary Verification Process Completed: Yes Hospitalized since last visit: No Patient Requires Transmission-Based Precautions: No Implantable device outside of the clinic excluding No Patient Has Alerts: No cellular tissue based products placed in the center since last visit: Has Dressing in Place as Prescribed: Yes Pain Present Now: No Electronic Signature(s) Signed: 09/03/2020 11:07:31 AM By: Sandre Kitty Entered By: Sandre Kitty on 09/02/2020 15:41:31 -------------------------------------------------------------------------------- Clinic Level of Care Assessment Details Patient Name: Date of Service: [name], [name] 09/02/2020 3:45 PM Medical Record Number: [id number] Patient Account Number: [id number] Date of Birth/Sex: Treating RN: [birth year] (55 y.o. [name] Primary Care Loribeth Katich: Jenna Luo Other Clinician: Referring Judas Mohammad: Treating Danashia Landers/Extender: Rosezena Sensor in Treatment: 13 Clinic Level of Care Assessment Items TOOL 4 Quantity Score []  - 0 Use when only  an EandM is performed on FOLLOW-UP visit ASSESSMENTS - Nursing Assessment / Reassessment X- 1 10 Reassessment of Co-morbidities (includes updates in patient status) X- 1 5 Reassessment of Adherence to Treatment Plan ASSESSMENTS - Wound and Skin A ssessment / Reassessment X - Simple Wound Assessment / Reassessment - one wound 1 5 []  - 0 Complex Wound Assessment / Reassessment - multiple wounds []  - 0 Dermatologic / Skin Assessment (not related to wound area) ASSESSMENTS - Focused Assessment []  - 0 Circumferential Edema Measurements - multi extremities []  - 0 Nutritional Assessment / Counseling / Intervention X- 1 5 Lower Extremity Assessment (monofilament, tuning fork, pulses) []  - 0 Peripheral Arterial Disease Assessment (using hand held doppler) ASSESSMENTS - Ostomy and/or Continence Assessment and Care []  - 0 Incontinence Assessment and Management []  - 0 Ostomy Care Assessment and Management (repouching, etc.) PROCESS - Coordination of Care X - Simple Patient / Family Education for ongoing care 1 15 []  - 0 Complex (extensive) Patient / Family Education for ongoing care X- 1 10 Staff obtains Programmer, systems, Records, T Results / Process Orders est []  - 0 Staff telephones HHA, Nursing Homes / Clarify orders / etc []  - 0 Routine Transfer to another Facility (non-emergent condition) []  - 0 Routine Hospital Admission (non-emergent condition) []  - 0 New Admissions / Biomedical engineer / Ordering NPWT Apligraf, etc. , []  - 0 Emergency Hospital Admission (emergent condition) X- 1 10 Simple Discharge Coordination []  - 0 Complex (extensive) Discharge Coordination PROCESS - Special Needs []  - 0 Pediatric / Minor Patient Management []  - 0 Isolation Patient Management []  - 0 Hearing / Language / Visual special needs []  - 0 Assessment of Community assistance (transportation, D/C planning, etc.) []  - 0 Additional assistance / Altered mentation []  - 0 Support Surface(s)  Assessment (bed, cushion, seat, etc.) INTERVENTIONS - Wound Cleansing / Measurement X - Simple Wound Cleansing - one wound 1 5 []  - 0 Complex Wound Cleansing - multiple wounds X- 1 5 Wound  Imaging (photographs - any number of wounds) []  - 0 Wound Tracing (instead of photographs) []  - 0 Simple Wound Measurement - one wound []  - 0 Complex Wound Measurement - multiple wounds INTERVENTIONS - Wound Dressings X - Small Wound Dressing one or multiple wounds 1 10 []  - 0 Medium Wound Dressing one or multiple wounds []  - 0 Large Wound Dressing one or multiple wounds []  - 0 Application of Medications - topical []  - 0 Application of Medications - injection INTERVENTIONS - Miscellaneous []  - 0 External ear exam []  - 0 Specimen Collection (cultures, biopsies, blood, body fluids, etc.) []  - 0 Specimen(s) / Culture(s) sent or taken to Lab for analysis []  - 0 Patient Transfer (multiple staff / Civil Service fast streamer / Similar devices) []  - 0 Simple Staple / Suture removal (25 or less) []  - 0 Complex Staple / Suture removal (26 or more) []  - 0 Hypo / Hyperglycemic Management (close monitor of Blood Glucose) []  - 0 Ankle / Brachial Index (ABI) - do not check if billed separately X- 1 5 Vital Signs Has the patient been seen at the hospital within the last three years: Yes Total Score: 85 Level Of Care: New/Established - Level 3 Electronic Signature(s) Signed: 09/02/2020 5:35:12 PM By: Baruch Gouty RN, BSN Entered By: Baruch Gouty on 09/02/2020 16:06:14 -------------------------------------------------------------------------------- Encounter Discharge Information Details Patient Name: Date of Service: [name] Shutter D. 09/02/2020 3:45 PM Medical Record Number: [id number] Patient Account Number: [id number] Date of Birth/Sex: Treating RN: 07/16/1965 (55 y.o. [name] Primary Care Jerzi Tigert: Jenna Luo Other Clinician: Referring Astryd Pearcy: Treating Sherard Sutch/Extender: Rosezena Sensor in Treatment: 37 Encounter Discharge Information Items Discharge Condition: Stable Ambulatory Status: Cane Discharge Destination: Home Transportation: Private Auto Accompanied By: friend Schedule Follow-up Appointment: Yes Clinical Summary of Care: Patient Declined Electronic Signature(s) Signed: 09/02/2020 5:35:12 PM By: Baruch Gouty RN, BSN Entered By: Baruch Gouty on 09/02/2020 16:12:56 -------------------------------------------------------------------------------- Lower Extremity Assessment Details Patient Name: Date of Service: [name] [name] 09/02/2020 3:45 PM Medical Record Number: [id number] Patient Account Number: [id number] Date of Birth/Sex: Treating RN: 1966/03/12 (55 y.o. [name] Primary Care Loraine Bhullar: Jenna Luo Other Clinician: Referring Rachelann Enloe: Treating Trigger Frasier/Extender: Rosezena Sensor in Treatment: 13 Edema Assessment Assessed: Shirlyn Goltz: No] Patrice Paradise: No] Edema: [Left: N] [Right: o] Calf Left: Right: Point of Measurement: 34 cm From Medial Instep 34 cm Ankle Left: Right: Point of Measurement: 9 cm From Medial Instep 23 cm Vascular Assessment Pulses: Dorsalis Pedis Palpable: [Right:No] Electronic Signature(s) Signed: 09/02/2020 5:35:12 PM By: Baruch Gouty RN, BSN Entered By: Baruch Gouty on 09/02/2020 16:04:46 -------------------------------------------------------------------------------- Multi Wound Chart Details Patient Name: Date of Service: [name] Shutter D. 09/02/2020 3:45 PM Medical Record Number: [id number] Patient Account Number: [id number] Date of Birth/Sex: Treating RN: 06-08-1965 (55 y.o. 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] [N/A:N/A] Primary Etiology: [1:03/05/2020] [N/A:N/A] Date Acquired: [1:13] [N/A:N/A] Weeks of Treatment: [1:Open] [N/A:N/A] Wound Status: [1:0x0x0] [N/A:N/A] Measurements L x W x D (cm) [1:0] [N/A:N/A] A (cm) : rea [1:0] [N/A:N/A] Volume (cm) : [1:100.00%] [N/A:N/A] % Reduction in A rea: [1:100.00%] [N/A:N/A] % Reduction in Volume: [1:Full Thickness Without Exposed] [N/A:N/A] Classification: [1:Support Structures] Treatment Notes  Electronic Signature(s) Signed: 09/02/2020 4:16:43 PM By: Linton Ham MD Signed: 09/02/2020 5:09:03 PM By: Rhae Hammock RN Entered By: Linton Ham on 09/02/2020 16:07:08 -------------------------------------------------------------------------------- Multi-Disciplinary Care Plan Details Patient Name: Date of Service: [name] [name] 09/02/2020 3:45 PM Medical Record Number: [id number] Patient Account Number: [id number] Date of Birth/Sex: Treating RN: 08/04/1965 (55 y.o. [name] Primary Care Icel Castles: Jenna Luo Other Clinician: Referring Larnell Granlund: Treating Olof Marcil/Extender: Rosezena Sensor in Treatment: Crane reviewed with physician Active Inactive Electronic Signature(s) Signed: 09/02/2020 5:35:12 PM By: Baruch Gouty RN, BSN Entered By: Baruch Gouty on 09/02/2020 16:05:14 -------------------------------------------------------------------------------- Pain Assessment Details Patient Name: Date of Service: [name] [name] 09/02/2020 3:45 PM Medical Record Number: [id number] Patient Account Number: [id number] Date of Birth/Sex: Treating RN: Sep 03, 1965 (55 y.o. Tonita Phoenix, Lauren Primary  Care Sahir Tolson: Jenna Luo Other Clinician: Referring Eshan Trupiano: Treating Kama Cammarano/Extender: Rosezena Sensor in Treatment: 13 Active Problems Location of Pain Severity and Description of Pain Patient Has Paino No Site Locations Pain Management and Medication Current Pain Management: Electronic Signature(s) Signed: 09/02/2020 5:09:03 PM By: Rhae Hammock RN Signed: 09/03/2020 11:07:31 AM By: Sandre Kitty Entered By: Sandre Kitty on 09/02/2020 15:41:52 -------------------------------------------------------------------------------- Patient/Caregiver Education Details Patient Name: Date of Service: [name] [name] 5/17/2022andnbsp3:45 PM Medical Record Number: [id number] Patient Account Number: [id number] Date of Birth/Gender: Treating RN: July 22, 1965 (55 y.o. [name] Primary Care Physician: Jenna Luo Other Clinician: Referring Physician: Treating Physician/Extender: Rosezena Sensor in Treatment: 13 Education Assessment Education Provided To: Patient Education Topics Provided Offloading: Methods: Explain/Verbal Responses: Reinforcements needed, State content correctly Wound/Skin Impairment: Methods: Explain/Verbal Responses: Reinforcements needed, State content correctly Electronic Signature(s) Signed: 09/02/2020 5:35:12 PM By: Baruch Gouty RN, BSN Entered By: Baruch Gouty on 09/02/2020 16:05:33 -------------------------------------------------------------------------------- Wound Assessment Details Patient Name: Date of Service: [name] [name] 09/02/2020 3:45 PM Medical Record Number: [id number] Patient Account Number: [id number] Date of Birth/Sex: Treating RN: 25-Dec-1965 (55 y.o. 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 - Epithelialized Wounding Event: Gradually Appeared Status: Date Acquired: 03/05/2020 Comorbid Asthma, Sleep Apnea, Hypertension, Osteoarthritis, Weeks Of Treatment: 13 History: Neuropathy, Seizure Disorder Clustered Wound: No Photos Wound Measurements Length: (cm) Width: (cm) Depth: (cm) Area: (cm) Volume: (cm) 0 % Reduction in Area: 100% 0 % Reduction in Volume: 100% 0 0 0 Wound Description Classification: Full Thickness Without Exposed Support Structur es Electronic Signature(s) Signed: 09/02/2020 5:09:03 PM By: Rhae Hammock RN Signed: 09/03/2020 11:07:31 AM By: Sandre Kitty Entered By: Sandre Kitty on 09/02/2020 16:30:54 -------------------------------------------------------------------------------- Vitals Details Patient Name: Date of Service: [name] Shutter D. 09/02/2020 3:45 PM Medical Record Number: [id number] Patient Account Number: [id number] Date of Birth/Sex: Treating RN: 01-13-1966 (55 y.o. Tonita Phoenix, Lauren Primary Care Jeyla Bulger: Jenna Luo Other Clinician: Referring Dorien Mayotte: Treating Bethany Hirt/Extender: Rosezena Sensor in Treatment: 13 Vital Signs Time Taken: 15:41 Temperature (F): 98.4 Height (in): 64 Pulse (bpm): 83 Weight (lbs): 228 Respiratory Rate (breaths/min): 18 Body Mass Index (BMI): 39.1 Blood Pressure (mmHg): 119/82 Reference Range: 80 - 120 mg / dl Electronic Signature(s) Signed: 09/03/2020 11:07:31 AM By: Sandre Kitty Entered By: Sandre Kitty on 09/02/2020 15:41:47

## 2020-09-12 ENCOUNTER — Ambulatory Visit (INDEPENDENT_AMBULATORY_CARE_PROVIDER_SITE_OTHER): Payer: 59

## 2020-09-12 ENCOUNTER — Other Ambulatory Visit: Payer: Self-pay

## 2020-09-12 DIAGNOSIS — G629 Polyneuropathy, unspecified: Secondary | ICD-10-CM

## 2020-09-15 ENCOUNTER — Other Ambulatory Visit: Payer: Self-pay | Admitting: Cardiovascular Disease

## 2020-09-16 ENCOUNTER — Other Ambulatory Visit: Payer: Self-pay | Admitting: Cardiovascular Disease

## 2020-09-24 ENCOUNTER — Other Ambulatory Visit: Payer: Self-pay

## 2020-09-24 ENCOUNTER — Encounter (HOSPITAL_BASED_OUTPATIENT_CLINIC_OR_DEPARTMENT_OTHER): Payer: Medicare Other | Attending: Internal Medicine | Admitting: Internal Medicine

## 2020-09-24 DIAGNOSIS — Z899 Acquired absence of limb, unspecified: Secondary | ICD-10-CM | POA: Insufficient documentation

## 2020-09-24 DIAGNOSIS — G9009 Other idiopathic peripheral autonomic neuropathy: Secondary | ICD-10-CM | POA: Insufficient documentation

## 2020-09-24 DIAGNOSIS — Z89422 Acquired absence of other left toe(s): Secondary | ICD-10-CM | POA: Diagnosis not present

## 2020-09-24 DIAGNOSIS — L97512 Non-pressure chronic ulcer of other part of right foot with fat layer exposed: Secondary | ICD-10-CM | POA: Diagnosis not present

## 2020-09-24 NOTE — Progress Notes (Signed)
KENLI, WALDO ([id number]) Visit Report for 09/24/2020 Chief Complaint Document Details Patient Name: Date of Service: [name], [name] 09/24/2020 10:15 A M Medical Record Number: [id number] Patient Account Number: [id number] Date of Birth/Sex: Treating RN: 1966/04/09 (55 y.o. [name] Primary Care Provider: Jenna Luo Other Clinician: Referring Provider: Treating Provider/Extender: Michel Bickers in Treatment: 16 Information Obtained from: Patient Chief Complaint 06/03/2019; patient is here for review of a wound on her plantar foot transmetatarsal amputation site at roughly the third met head Electronic Signature(s) Signed: 09/24/2020 1:42:28 PM By: Kalman Shan DO Entered By: Kalman Shan on 09/24/2020 13:35:59 -------------------------------------------------------------------------------- Debridement Details Patient Name: Date of Service: [name] Shutter D. 09/24/2020 10:15 A M Medical Record Number: [id number] Patient Account Number: [id number] Date of Birth/Sex: Treating RN: August 04, 1965 (55 y.o. [name] Primary Care Provider: Jenna Luo Other Clinician: Referring Provider: Treating Provider/Extender: Michel Bickers in Treatment: 16 Debridement Performed for Assessment: Wound #1R Right,Plantar Amputation Site - Transmetatarsal Performed By: Physician Kalman Shan, DO Debridement Type: Debridement Level of Consciousness (Pre-procedure): Awake and Alert Pre-procedure Verification/Time Out Yes - 11:35 Taken: Start Time: 11:36 Pain Control: Other : benzocaine 20% spray T Area Debrided (L x W): otal 0.3 (cm) x 0.3 (cm) = 0.09 (cm) Tissue and other material debrided: Viable, Non-Viable, Callus, Subcutaneous, Skin: Epidermis Level: Skin/Subcutaneous Tissue Debridement Description: Excisional Instrument: Curette Bleeding: Minimum Hemostasis Achieved: Pressure End Time: 11:39 Procedural  Pain: 0 Post Procedural Pain: 0 Response to Treatment: Procedure was tolerated well Level of Consciousness (Post- Awake and Alert procedure): Post Debridement Measurements of Total Wound Length: (cm) 0.3 Width: (cm) 0.3 Depth: (cm) 0.2 Volume: (cm) 0.014 Character of Wound/Ulcer Post Debridement: Improved Post Procedure Diagnosis Same as Pre-procedure Electronic Signature(s) Signed: 09/24/2020 1:42:28 PM By: Kalman Shan DO Signed: 09/24/2020 6:12:56 PM By: Baruch Gouty RN, BSN Entered By: Baruch Gouty on 09/24/2020 11:40:28 -------------------------------------------------------------------------------- HPI Details Patient Name: Date of Service: [name] Shutter D. 09/24/2020 10:15 A M Medical Record Number: [id number] Patient Account Number: [id number] Date of Birth/Sex: Treating RN: 1966/02/17 (55 y.o. [name] Primary Care Provider: Jenna Luo Other Clinician: Referring Provider: Treating Provider/Extender: Michel Bickers in Treatment: 16 History of Present Illness HPI Description: ADMISSION 06/02/2020 This is a 55 year old woman who has had problems with wounds on her predominantly plantar right foot for quite a period of time. 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 More recently she has had an area developed in the right mid foot at roughly the third metatarsal head. She says this started as a blister of November. Its not been closing. More recently she has been using Silvadene cream she has a cam boot to offload. I note she walks with a cane The patient has had arterial studies done in 2019 at which time her ABI in the right was 1.28 with triphasic waveforms noncompressible on the left with triphasic waveform. Her ABI in our clinic today was 1.3 on the right. 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 She had a foot gastrocnemius recession sometime in the fall 2021 2/21; patient arrives today with a wound measuring slightly smaller 3 x 3 mm however there is circumferential area of erythema around this. She does not have any complaints  2/28; plantar wound on the right first transmetatarsal amputation site. Small oval-shaped wound raised skin around the edges. We have been using Hydrofera Blue. She does not have arterial issues 06/23/2020 upon evaluation today patient appears to be doing decently well. With regard to her wound. There is little bit of slough and some hyper granulation. I do not feel like the PolyMem did too well for her. In fact she may do better with some Hydrofera Blue. I know we just switch from that but I feel like that may be a better way to continue currently. 3/15; this is a patient with a wound on the plantar right transmetatarsal amputation site probably the second metatarsal head not 1 July as stated previously we have been using polymen switch to [facility] last week. Wound looks about the same to me. We are going to start her on a total contact cast today we have the boot to fit 3/18; first total contact cast change she tolerated this well 3/31; she comes back in with out total contact cast on for 10 days. In spite of this the small wound over the third metatarsal head of her right transmetatarsal amputation site almost looks the same slightly hyper granulated. Circumference is not well adhered. We have been using Hydrofera Blue 4/7 she comes back in in her total contact cast. The wound is smaller epithelializing and looks like it on its way to closing. We have been using Hydrofera Blue over the wound area She has a right transmetatarsal amputation. She tells [place] that she has been to [facility] in the past and they did a modified shoe for her however  it did not keep this area epithelialized. She is going to bring that in next week for [place] to look at. It does not sound however that she was really offloading or padding this area at all 4/14; total contact cast. I changed her to calcium alginate today. I think most of the small area is epithelialized although there is clearly not complete epithelialization and even the area that is epithelialized and the small wound is very tiny She shows me her shoe with the insert. It looks as though the insert probably was too large for her transmet it foot allowing it to move back and forward. I can see where the friction was in the blister she describes forming. I have asked her to take this back to [facility] to see if there is anything they can do 4/21; patient presents for her 1 week follow-up. She has been treated with a total contact cast weekly. She has no complaints today. She denies any fever/chills. 4/26; patient presents for 1 week follow-up in total contact cast change. Patient is having knee pain to her right knee due to chronic osteoarthritis. She is hoping to be able to wear her brace over the cast. 5/10; the wound is closed over however still looks vulnerable. 5/17; the wound remains closed surface looks better. She has a previous right transmet. She is going to convert this into a sneaker. She had a forefoot prosthesis for the foot but not sure that that are too much good she is supposed to have taken aback from modification however she can do that now. 6/8; patient returns to our clinic for reopening of the plantar right foot transmetatarsal amputation site. She has slight warmth and erythema to the area. She would like to try conservative wound care and not do the total contact cast that was  she was doing before at this time Electronic Signature(s) Signed: 09/24/2020 1:42:28 PM By: Kalman Shan DO Entered By: Kalman Shan on 09/24/2020  13:38:49 -------------------------------------------------------------------------------- Physical Exam Details Patient Name: Date of Service: [name] Shutter D. 09/24/2020 10:15 A M Medical Record Number: [id number] Patient Account Number: [id number] Date of Birth/Sex: Treating RN: 16-Jul-1965 (55 y.o. [name] Primary Care Provider: Jenna Luo Other Clinician: Referring Provider: Treating Provider/Extender: Michel Bickers in Treatment: 16 Constitutional respirations regular, non-labored and within target range for patient.. Cardiovascular 2+ dorsalis pedis/posterior tibialis pulses. Psychiatric pleasant and cooperative. Notes Right foot: Small open wound to the plantar aspect with granulation tissue post-debridement. Increased warmth and erythema to the site. No purulent drainage noted. Electronic Signature(s) Signed: 09/24/2020 1:42:28 PM By: Kalman Shan DO Entered By: Kalman Shan on 09/24/2020 13:39:38 -------------------------------------------------------------------------------- Physician Orders Details Patient Name: Date of Service: [name] Shutter D. 09/24/2020 10:15 A M Medical Record Number: [id number] Patient Account Number: [id number] Date of Birth/Sex: Treating RN: November 22, 1965 (55 y.o. [name] Primary Care Provider: Jenna Luo Other Clinician: Referring Provider: Treating Provider/Extender: Michel Bickers in Treatment: 34 Verbal / Phone Orders: No Diagnosis Coding ICD-10 Coding Code Description L97.512 Non-pressure chronic ulcer of other part of right foot with fat layer exposed G90.09 Other idiopathic peripheral autonomic neuropathy Follow-up Appointments ppointment in 1 week. - with Dr. Dellia Nims Return A Edema Control - Lymphedema / SCD / Other Bilateral Lower Extremities Elevate legs to the level of the heart or above for 30 minutes daily and/or when sitting, a frequency  of: Avoid standing for long periods of time. Moisturize legs daily. - and feet nightly Off-Loading Other: - custom diabetic shoes Wound Treatment Wound #1R - Amputation Site - Transmetatarsal Wound Laterality: Plantar, Right Prim Dressing: KerraCel Ag Gelling Fiber Dressing, 2x2 in (silver alginate) ary 1 x Per Day/30 Days Discharge Instructions: Apply silver alginate to wound bed as instructed Secondary Dressing: Woven Gauze Sponges 2x2 in 1 x Per Day/30 Days Discharge Instructions: Apply over primary dressing as directed. Secondary Dressing: Optifoam Non-Adhesive Dressing, 4x4 in 1 x Per Day/30 Days Discharge Instructions: Apply over primary dressing cut to make foam donut Secured With: Kerlix Roll Sterile, 4.5x3.1 (in/yd) 1 x Per Day/30 Days Discharge Instructions: Secure with Kerlix as directed. Electronic Signature(s) Signed: 09/24/2020 1:42:28 PM By: Kalman Shan DO Entered By: Kalman Shan on 09/24/2020 13:39:56 -------------------------------------------------------------------------------- Problem List Details Patient Name: Date of Service: [name] Shutter D. 09/24/2020 10:15 A M Medical Record Number: [id number] Patient Account Number: [id number] Date of Birth/Sex: Treating RN: Jun 02, 1965 (55 y.o. [name] Primary Care Provider: Jenna Luo Other Clinician: Referring Provider: Treating Provider/Extender: Michel Bickers in Treatment: 16 Active Problems ICD-10 Encounter Code Description Active Date MDM Diagnosis L97.512 Non-pressure chronic ulcer of other part of right foot with fat layer exposed 06/02/2020 No Yes G90.09 Other idiopathic peripheral autonomic neuropathy 06/02/2020 No Yes Inactive Problems ICD-10 Code Description Active Date Inactive Date L03.115 Cellulitis of right lower limb 06/09/2020 06/09/2020 Resolved Problems Electronic Signature(s) Signed: 09/24/2020 1:42:28 PM By: Kalman Shan DO Entered By: Kalman Shan on 09/24/2020 13:33:23 -------------------------------------------------------------------------------- Progress Note Details Patient Name: Date of Service: [name] Shutter D. 09/24/2020 10:15 A M Medical Record Number: [id number] Patient Account Number: [id number] Date of Birth/Sex: Treating RN: Nov 12, 1965 (55 y.o. [name] Primary Care Provider: Other Clinician: Jenna Luo Referring Provider: Treating Provider/Extender: Michel Bickers in Treatment: 16 Subjective Chief Complaint Information obtained from Patient 06/03/2019; patient is  here for review of a wound on her plantar foot transmetatarsal amputation site at roughly the third met head History of Present Illness (HPI) ADMISSION 06/02/2020 This is a 55 year old woman who has had problems with wounds on her predominantly plantar right foot for quite a period of time. 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 More recently she has had an area developed in the right mid foot at roughly the third metatarsal head. She says this started as a blister of November. Its not been closing. More recently she has been using Silvadene cream she has a cam boot to offload. I note she walks with a cane The patient has had arterial studies done in 2019 at which time her ABI in the right was 1.28 with triphasic waveforms noncompressible on the left with triphasic waveform. Her ABI in our clinic today was 1.3 on the right. 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 She had a foot gastrocnemius recession sometime in the fall 2021 2/21; patient arrives today with a wound measuring slightly smaller 3 x 3 mm however there is circumferential area of erythema around this. She does not have  any complaints 2/28; plantar wound on the right first transmetatarsal amputation site. Small oval-shaped wound raised skin around the edges. We have been using Hydrofera Blue. She does not have arterial issues 06/23/2020 upon evaluation today patient appears to be doing decently well. With regard to her wound. There is little bit of slough and some hyper granulation. I do not feel like the PolyMem did too well for her. In fact she may do better with some Hydrofera Blue. I know we just switch from that but I feel like that may be a better way to continue currently. 3/15; this is a patient with a wound on the plantar right transmetatarsal amputation site probably the second metatarsal head not 1 July as stated previously we have been using polymen switch to [facility] last week. Wound looks about the same to me. We are going to start her on a total contact cast today we have the boot to fit 3/18; first total contact cast change she tolerated this well 3/31; she comes back in with out total contact cast on for 10 days. In spite of this the small wound over the third metatarsal head of her right transmetatarsal amputation site almost looks the same slightly hyper granulated. Circumference is not well adhered. We have been using Hydrofera Blue 4/7 she comes back in in her total contact cast. The wound is smaller epithelializing and looks like it on its way to closing. We have been using Hydrofera Blue over the wound area She has a right transmetatarsal amputation. She tells [place] that she has been to [facility] in the past and they did a modified shoe for her however it did not keep this area epithelialized. She is going to bring that in next week for [place] to look at. It does not sound however that she was really offloading or padding this area at all 4/14; total contact cast. I changed her to calcium alginate today. I think most of the small area is epithelialized although there is clearly not  complete epithelialization and even the area that is epithelialized and the small wound  is very tiny She shows me her shoe with the insert. It looks as though the insert probably was too large for her transmet it foot allowing it to move back and forward. I can see where the friction was in the blister she describes forming. I have asked her to take this back to [facility] to see if there is anything they can do 4/21; patient presents for her 1 week follow-up. She has been treated with a total contact cast weekly. She has no complaints today. She denies any fever/chills. 4/26; patient presents for 1 week follow-up in total contact cast change. Patient is having knee pain to her right knee due to chronic osteoarthritis. She is hoping to be able to wear her brace over the cast. 5/10; the wound is closed over however still looks vulnerable. 5/17; the wound remains closed surface looks better. She has a previous right transmet. She is going to convert this into a sneaker. She had a forefoot prosthesis for the foot but not sure that that are too much good she is supposed to have taken aback from modification however she can do that now. 6/8; patient returns to our clinic for reopening of the plantar right foot transmetatarsal amputation site. She has slight warmth and erythema to the area. She would like to try conservative wound care and not do the total contact cast that was she was doing before at this time Patient History Information obtained from Patient. Family History Cancer - Paternal Grandparents, Diabetes - Mother,Maternal Grandparents, Heart Disease - Mother,Paternal Grandparents,Maternal Grandparents,Father,Siblings, Hypertension - Mother,Father, Lung Disease - Mother, No family history of Hereditary Spherocytosis, Kidney Disease, Seizures, Stroke, Thyroid Problems, Tuberculosis. Social History Never smoker, Marital Status - Widowed, Alcohol Use - Never, Drug Use - Prior History,  Caffeine Use - Daily - soda's. Medical History Eyes Denies history of Cataracts, Glaucoma, Optic Neuritis Ear/Nose/Mouth/Throat Denies history of Chronic sinus problems/congestion, Middle ear problems Hematologic/Lymphatic Denies history of Anemia, Hemophilia, Human Immunodeficiency Virus, Lymphedema, Sickle Cell Disease Respiratory Patient has history of Asthma, Sleep Apnea Denies history of Aspiration, Chronic Obstructive Pulmonary Disease (COPD), Pneumothorax, Tuberculosis Cardiovascular Patient has history of Hypertension Denies history of Angina, Arrhythmia, Congestive Heart Failure, Coronary Artery Disease, Deep Vein Thrombosis, Hypotension, Myocardial Infarction, Peripheral Arterial Disease, Peripheral Venous Disease, Phlebitis, Vasculitis Gastrointestinal Denies history of Cirrhosis , Colitis, Crohnoos, Hepatitis A, Hepatitis B, Hepatitis C Endocrine Denies history of Type I Diabetes, Type II Diabetes Genitourinary Denies history of End Stage Renal Disease Immunological Denies history of Lupus Erythematosus, Raynaudoos, Scleroderma Integumentary (Skin) Denies history of History of Burn Musculoskeletal Patient has history of Osteoarthritis Denies history of Gout, Rheumatoid Arthritis, Osteomyelitis Neurologic Patient has history of Neuropathy, 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 - left 4th toe amp. - neck surgery. - spine surgery. - abdom. hysterectomy. - appendectomy. - lap. knee surgery. Medical A Surgical History Notes nd Gastrointestinal Hx of GERD Psychiatric hx of bipolar 1 disorder, anxiety, depression. Objective Constitutional respirations regular, non-labored and within target range for  patient.. Vitals Time Taken: 10:43 AM, Height: 64 in, Weight: 228 lbs, BMI: 39.1, Temperature: 97.8 F, Pulse: 78 bpm, Respiratory Rate: 16 breaths/min, Blood Pressure: 134/88 mmHg. Cardiovascular 2+ dorsalis pedis/posterior tibialis pulses. Psychiatric pleasant and cooperative. General Notes: Right foot: Small open wound to the plantar aspect with granulation tissue  post-debridement. Increased warmth and erythema to the site. No purulent drainage noted. Integumentary (Hair, Skin) Wound #1R status is Open. Original cause of wound was Gradually Appeared. The date acquired was: 03/05/2020. The wound has been in treatment 16 weeks. The wound is located on the Right,Plantar Amputation Site - Transmetatarsal. The wound measures 0.3cm length x 0.2cm width x 0.2cm depth; 0.047cm^2 area and 0.009cm^3 volume. There is Fat Layer (Subcutaneous Tissue) exposed. There is no tunneling or undermining noted. There is a medium amount of serosanguineous drainage noted. The wound margin is distinct with the outline attached to the wound base. There is large (67-100%) red granulation within the wound bed. There is no necrotic tissue within the wound bed. General Notes: dried blood blister noted distal from wound. Assessment Active Problems ICD-10 Non-pressure chronic ulcer of other part of right foot with fat layer exposed Other idiopathic peripheral autonomic neuropathy Patient was recently discharged from our office and presents today for recurrence of her previous wound. The surrounding tissue does appear infected and I will start Keflex. I debrided the wound and postdebridement there is granulation tissue present. She does not want to try the contact cast again at this time. She would likely benefit from silver alginate to this area. We discussed offloading and will add a donut foam pad to help with this. This is a patient of Dr. Janalyn Rouse and she will follow-up next week with him. 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 There was a Excisional Skin/Subcutaneous Tissue Debridement with a total area of 0.09 sq cm performed by Kalman Shan, DO. With the following instrument(s): Curette to remove Viable and Non-Viable tissue/material. Material removed includes Callus, Subcutaneous Tissue, and Skin: Epidermis after achieving pain control using Other (benzocaine 20% spray). No specimens were taken. A time out was conducted at 11:35, prior to the start of the procedure. A Minimum amount of bleeding was controlled with Pressure. The procedure was tolerated well with a pain level of 0 throughout and a pain level of 0 following the procedure. Post Debridement Measurements: 0.3cm length x 0.3cm width x 0.2cm depth; 0.014cm^3 volume. Character of Wound/Ulcer Post Debridement is improved. Post procedure Diagnosis Wound #1R: Same as Pre-Procedure Plan Follow-up Appointments: Return Appointment in 1 week. - with Dr. Dellia Nims Edema Control - Lymphedema / SCD / Other: Elevate legs to the level of the heart or above for 30 minutes daily and/or when sitting, a frequency of: Avoid standing for long periods of time. Moisturize legs daily. - and feet nightly Off-Loading: Other: - custom diabetic shoes WOUND #1R: - Amputation Site - Transmetatarsal Wound Laterality: Plantar, Right Prim Dressing: KerraCel Ag Gelling Fiber Dressing, 2x2 in (silver alginate) 1 x Per Day/30 Days ary Discharge Instructions: Apply silver alginate to wound bed as instructed Secondary Dressing: Woven Gauze Sponges 2x2 in 1 x Per Day/30 Days Discharge Instructions: Apply over primary dressing as directed. Secondary Dressing: Optifoam Non-Adhesive Dressing, 4x4 in 1 x Per Day/30 Days Discharge Instructions: Apply over primary dressing cut to make foam donut Secured With: Kerlix Roll Sterile, 4.5x3.1 (in/yd) 1 x Per  Day/30 Days Discharge Instructions: Secure with Kerlix as directed. 1. Silver alginate 2. Aggressive offloading 3. In office sharp debridement 4. Follow-up in 1 week with Dr. Dellia Nims Electronic Signature(s) Signed: 09/24/2020 1:42:28 PM By: Kalman Shan DO Entered By: Kalman Shan on 09/24/2020 13:41:45 -------------------------------------------------------------------------------- HxROS Details Patient Name: Date of Service: [name] Shutter D. 09/24/2020  10:15 A M Medical Record Number: [id number] Patient Account Number: [id number] Date of Birth/Sex: Treating RN: 1966-04-09 (55 y.o. [name] Primary Care Provider: Jenna Luo Other Clinician: Referring Provider: Treating Provider/Extender: Michel Bickers in Treatment: 16 Information Obtained From Patient Eyes Medical History: Negative for: Cataracts; Glaucoma; Optic Neuritis Ear/Nose/Mouth/Throat Medical History: Negative for: Chronic sinus problems/congestion; Middle ear problems Hematologic/Lymphatic Medical History: Negative for: Anemia; Hemophilia; Human Immunodeficiency Virus; Lymphedema; Sickle Cell Disease Respiratory Medical History: Positive for: Asthma; Sleep Apnea Negative for: Aspiration; Chronic Obstructive Pulmonary Disease (COPD); Pneumothorax; Tuberculosis Cardiovascular Medical History: Positive for: Hypertension Negative for: Angina; Arrhythmia; Congestive Heart Failure; Coronary Artery Disease; Deep Vein Thrombosis; Hypotension; Myocardial Infarction; Peripheral Arterial Disease; Peripheral Venous Disease; Phlebitis; Vasculitis Gastrointestinal Medical History: Negative for: Cirrhosis ; Colitis; Crohns; Hepatitis A; Hepatitis B; Hepatitis C Past Medical History Notes: Hx of GERD Endocrine Medical History: Negative for: Type I Diabetes; Type II Diabetes Genitourinary Medical History: Negative for: End Stage Renal Disease Immunological Medical  History: Negative for: Lupus Erythematosus; Raynauds; Scleroderma Integumentary (Skin) Medical History: Negative for: History of Burn Musculoskeletal Medical History: Positive for: Osteoarthritis Negative for: Gout; Rheumatoid Arthritis; Osteomyelitis Neurologic Medical History: Positive for: Neuropathy; Seizure Disorder - hx of seizures r/t tramadol Negative for: Dementia; Quadriplegia; Paraplegia Oncologic Medical History: Negative for: Received Chemotherapy; Received Radiation Psychiatric Medical History: Negative for: Anorexia/bulimia; Confinement Anxiety Past Medical History Notes: hx of bipolar 1 disorder, anxiety, depression. Immunizations Pneumococcal Vaccine: Received Pneumococcal Vaccination: Yes Immunization Notes: pt. doesn't remember last tetanus shot Implantable Devices None Hospitalization / Surgery History Type of Hospitalization/Surgery trasmet amp right foot left 4th toe amp neck surgery spine surgery abdom. hysterectomy appendectomy lap. knee surgery Family and Social History Cancer: Yes - Paternal Grandparents; Diabetes: Yes - Mother,Maternal Grandparents; Heart Disease: Yes - Mother,Paternal Grandparents,Maternal Grandparents,Father,Siblings; Hereditary Spherocytosis: No; Hypertension: Yes - Mother,Father; Kidney Disease: No; Lung Disease: Yes - Mother; Seizures: No; Stroke: No; Thyroid Problems: No; Tuberculosis: No; Never smoker; Marital Status - Widowed; Alcohol Use: Never; Drug Use: Prior History; Caffeine Use: Daily - soda's; Financial Concerns: No; Food, Clothing or Shelter Needs: No; Support System Lacking: No; Transportation Concerns: No Electronic Signature(s) Signed: 09/24/2020 1:42:28 PM By: Kalman Shan DO Signed: 09/24/2020 6:12:56 PM By: Baruch Gouty RN, BSN Entered By: Kalman Shan on 09/24/2020 13:38:56 -------------------------------------------------------------------------------- [place] Details Patient Name: Date of  Service: [name] 09/24/2020 Medical Record Number: [id number] Patient Account Number: [id number] Date of Birth/Sex: Treating RN: 1966-03-20 (55 y.o. [name] Primary Care Provider: Jenna Luo Other Clinician: Referring Provider: Treating Provider/Extender: Michel Bickers in Treatment: 16 Diagnosis Coding ICD-10 Codes Code Description [id number] Non-pressure chronic ulcer of other part of right foot with fat layer exposed G90.09 Other idiopathic peripheral autonomic neuropathy Facility Procedures CPT4 Code: 91638466 Description: 59935 - WOUND CARE VISIT-LEV 1 EST PT Modifier: 25 Quantity: 1 CPT4 Code: 70177939 Description: 11042 - DEB SUBQ TISSUE 20 SQ CM/< ICD-10 Diagnosis Description L97.512 Non-pressure chronic ulcer of other part of right foot with fat layer exposed Modifier: Quantity: 1 Physician Procedures : CPT4 Code Description Modifier [id number] 30076 - WC PHYS LEVEL 3 - EST PT ICD-10 Diagnosis Description L97.512 Non-pressure chronic ulcer of other part of right foot with fat layer exposed G90.09 Other idiopathic peripheral autonomic neuropathy Quantity: 1 : [id number] 11042 - WC PHYS SUBQ TISS 20 SQ CM ICD-10 Diagnosis Description L97.512 Non-pressure chronic ulcer of other part of right foot with fat layer exposed Quantity: 1 Electronic Signature(s) Signed: 09/24/2020 1:42:28 PM  By: Kalman Shan DO Entered By: Kalman Shan on 09/24/2020 13:42:08

## 2020-09-24 NOTE — Progress Notes (Signed)
[name] [name], [name] [name] ([id number]) Visit Report for 09/24/2020 Arrival Information Details Patient Name: Date of Service: [name] [name], [name] [name] 09/24/2020 10:15 A M Medical Record Number: [id number] Patient Account Number: [id number] Date of Birth/Sex: Treating RN: 08/17/1965 (55 y.o. Helene Shoe, Meta.Reding Primary Care Brittain Hosie: Jenna Luo Other Clinician: Referring Yanuel Tagg: Treating Yi Falletta/Extender: Michel Bickers in Treatment: 16 Visit Information History Since Last Visit Added or deleted any medications: No Patient Arrived: [name] [name] Any new allergies or adverse reactions: No Arrival Time: 10:44 Had a fall or experienced change in No Accompanied By: self activities of daily living that may affect Transfer Assistance: None risk of falls: Patient Identification Verified: Yes Signs or symptoms of abuse/neglect since last visito No Secondary Verification Process Completed: Yes Hospitalized since last visit: No Patient Requires Transmission-Based Precautions: No Implantable device outside of the clinic excluding No Patient Has Alerts: No cellular tissue based products placed in the center since last visit: Has Footwear/Offloading in Place as Prescribed: Yes Left: Other:diabetic orthotic shoes Right: Other:diabetic orthotic shoes Pain Present Now: No Electronic Signature(s) Signed: 09/24/2020 6:05:11 PM By: Deon Pilling Entered By: Deon Pilling on 09/24/2020 10:48:48 -------------------------------------------------------------------------------- Clinic Level of Care Assessment Details Patient Name: Date of Service: [name] [name] 09/24/2020 10:15 A M Medical Record Number: [id number] Patient Account Number: [id number] Date of Birth/Sex: Treating RN: [birth year] (55 y.o. [name] Primary Care Daud Cayer: Jenna Luo Other Clinician: Referring Lasheka Kempner: Treating Coreena Rubalcava/Extender: Michel Bickers in Treatment: 16 Clinic Level  of Care Assessment Items TOOL 3 Quantity Score []  - 0 Use when EandM and Procedure is performed on FOLLOW-UP visit ASSESSMENTS - Nursing Assessment / Reassessment []  - 0 Reassessment of Co-morbidities (includes updates in patient status) []  - 0 Reassessment of Adherence to Treatment Plan ASSESSMENTS - Wound and Skin Assessment / Reassessment []  - Points for Wound Assessment can only be taken for a new wound of unknown or different etiology and a procedure is 0 NOT performed to that wound []  - 0 Simple Wound Assessment / Reassessment - one wound []  - 0 Complex Wound Assessment / Reassessment - multiple wounds []  - 0 Dermatologic / Skin Assessment (not related to wound area) ASSESSMENTS - Focused Assessment []  - 0 Circumferential Edema Measurements - multi extremities []  - 0 Nutritional Assessment / Counseling / Intervention X- 1 5 Lower Extremity Assessment (monofilament, tuning fork, pulses) []  - 0 Peripheral Arterial Disease Assessment (using hand held doppler) ASSESSMENTS - Ostomy and/or Continence Assessment and Care []  - 0 Incontinence Assessment and Management []  - 0 Ostomy Care Assessment and Management (repouching, etc.) PROCESS - Coordination of Care []  - Points for Discharge Coordination can only be taken for a new wound of unknown or different etiology and a procedure 0 is NOT performed to that wound X- 1 15 Simple Patient / Family Education for ongoing care []  - 0 Complex (extensive) Patient / Family Education for ongoing care X- 1 10 Staff obtains Programmer, systems, Records, T Results / Process Orders est []  - 0 Staff telephones HHA, Nursing Homes / Clarify orders / etc []  - 0 Routine Transfer to another Facility (non-emergent condition) []  - 0 Routine Hospital Admission (non-emergent condition) []  - 0 New Admissions / Biomedical engineer / Ordering NPWT Apligraf, etc. , []  - 0 Emergency Hospital Admission (emergent condition) []  - 0 Simple Discharge  Coordination []  - 0 Complex (extensive) Discharge Coordination PROCESS - Special Needs []  - 0 Pediatric / Minor Patient Management []  - 0 Isolation Patient Management []  - 0  Hearing / Language / Visual special needs $RemoveBefor'[]'RVHejdhaWHHy$  - 0 Assessment of Community assistance (transportation, D/C planning, etc.) $RemoveBeforeD'[]'EYOeOBgaInykKo$  - 0 Additional assistance / Altered mentation $RemoveBeforeDEI'[]'fFKnnzgVRAQpUvwQ$  - 0 Support Surface(s) Assessment (bed, cushion, seat, etc.) INTERVENTIONS - Wound Cleansing / Measurement $RemoveBefor'[]'SzmDzjFinkuw$  - Points for Wound Cleaning / Measurement, Wound Dressing, Specimen Collection and Specimen taken to lab can only 0 be taken for a new wound of unknown or different etiology and a procedure is NOT performed to that wound $RemoveBe'[]'vUEpfHmus$  - 0 Simple Wound Cleansing - one wound $RemoveB'[]'KMSHzqAs$  - 0 Complex Wound Cleansing - multiple wounds $RemoveBeforeD'[]'dXmcdEPJyFdbhB$  - 0 Wound Imaging (photographs - any number of wounds) $RemoveBe'[]'IpbKWuvOo$  - 0 Wound Tracing (instead of photographs) $RemoveBeforeD'[]'mQQjQqPDoIBEjb$  - 0 Simple Wound Measurement - one wound $RemoveB'[]'sKabklBU$  - 0 Complex Wound Measurement - multiple wounds INTERVENTIONS - Wound Dressings $RemoveBeforeD'[]'uOauFbTtJizjvF$  - 0 Small Wound Dressing one or multiple wounds $RemoveBeforeD'[]'EGSOwIxlJLuvSX$  - 0 Medium Wound Dressing one or multiple wounds $RemoveBeforeD'[]'fFCWxvQdOAETUg$  - 0 Large Wound Dressing one or multiple wounds INTERVENTIONS - Miscellaneous $RemoveBeforeD'[]'rOvvpdxMvhJNTD$  - 0 External ear exam $Remove'[]'HZEQjlp$  - 0 Specimen Collection (cultures, biopsies, blood, body fluids, etc.) $RemoveBefor'[]'rRQGWeSJETis$  - 0 Specimen(s) / Culture(s) sent or taken to Lab for analysis $RemoveBefo'[]'QZOEFBQtemq$  - 0 Patient Transfer (multiple staff / Civil Service fast streamer / Similar devices) $RemoveBeforeDE'[]'axpTvecyqZezJZp$  - 0 Simple Staple / Suture removal (25 or less) $Remove'[]'oxqjsyR$  - 0 Complex Staple / Suture removal (26 or more) $Remove'[]'BTTvoPE$  - 0 Hypo / Hyperglycemic Management (close monitor of Blood Glucose) $RemoveBefore'[]'duwiAKPjHDLat$  - 0 Ankle / Brachial Index (ABI) - do not check if billed separately X- 1 5 Vital Signs Has the patient been seen at the hospital within the last three years: Yes Total Score: 35 Level Of Care: New/Established - Level 1 Electronic Signature(s) Signed: 09/24/2020 6:12:56 PM By: Baruch Gouty RN, BSN Entered By: Baruch Gouty on 09/24/2020 12:07:07 -------------------------------------------------------------------------------- Encounter Discharge Information Details Patient Name: Date of Service: [name] Shutter D. 09/24/2020 10:15 A M Medical Record Number: [id number] Patient Account Number: [id number] Date of Birth/Sex: Treating RN: [birth year] (55 y.o. Tonita Phoenix, Lauren Primary Care Mehran Guderian: Jenna Luo Other Clinician: Referring Zilla Shartzer: Treating Ragan Reale/Extender: Michel Bickers in Treatment: 16 Encounter Discharge Information Items Post Procedure Vitals Discharge Condition: Stable Temperature (F): 97.4 Ambulatory Status: Ambulatory Pulse (bpm): 74 Discharge Destination: Home Respiratory Rate (breaths/min): 17 Transportation: Private Auto Blood Pressure (mmHg): 125/74 Accompanied By: self Schedule Follow-up Appointment: Yes Clinical Summary of Care: Patient Declined Electronic Signature(s) Signed: 09/24/2020 6:04:38 PM By: Rhae Hammock RN Entered By: Rhae Hammock on 09/24/2020 12:12:51 -------------------------------------------------------------------------------- Lower Extremity Assessment Details Patient Name: Date of Service: [name] Shutter D. 09/24/2020 10:15 A M Medical Record Number: [id number] Patient Account Number: [id number] Date of Birth/Sex: Treating RN: May 11, 1965 (55 y.o. [name] Primary Care Madhuri Vacca: Jenna Luo Other Clinician: Referring Joao Mccurdy: Treating Shilee Biggs/Extender: Michel Bickers in Treatment: 16 Edema Assessment Assessed: Shirlyn Goltz: No] Patrice Paradise: Yes] Edema: [Left: N] [Right: o] Calf Left: Right: Point of Measurement: 34 cm From Medial Instep 40 cm Ankle Left: Right: Point of Measurement: 9 cm From Medial Instep 24 cm Vascular Assessment Pulses: Dorsalis Pedis Palpable: [Right:Yes] Electronic Signature(s) Signed: 09/24/2020 6:05:11 PM By: Deon Pilling Entered By: Deon Pilling on 09/24/2020 10:51:52 -------------------------------------------------------------------------------- Multi Wound Chart Details Patient Name: Date of Service: [name] Shutter D. 09/24/2020 10:15 A M Medical Record Number: [id number] Patient Account Number: [id number] Date of Birth/Sex: Treating RN: 05-01-1965 (55 y.o. [name] Primary Care Kayler Rise: Jenna Luo Other Clinician: Referring Loma Dubu[place]: Treating Hudsyn Barich/Extender: Roetta Sessions  Weeks in Treatment: 16 Vital Signs Height(in): 64 Pulse(bpm): 78 Weight(lbs): 228 Blood Pressure(mmHg): 134/88 Body Mass Index(BMI): 39 Temperature(F): 97.8 Respiratory Rate(breaths/min): 16 Photos: [1R:No Photos Right, Plantar Amputation Site -] [N/A:N/A N/A] Wound Location: [1R:Transmetatarsal Gradually Appeared] [N/A:N/A] Wounding Event: [1R:Neuropathic Ulcer-Non Diabetic] [N/A:N/A] Primary Etiology: [1R:Asthma, Sleep Apnea, Hypertension, N/A] Comorbid History: [1R:Osteoarthritis, Neuropathy, Seizure Disorder 03/05/2020] [N/A:N/A] Date Acquired: [1R:16] [N/A:N/A] Weeks of Treatment: [1R:Open] [N/A:N/A] Wound Status: [1R:Yes] [N/A:N/A] Wound Recurrence: [1R:0.3x0.2x0.2] [N/A:N/A] Measurements L x W x D (cm) [1R:0.047] [N/A:N/A] A (cm) : rea [1R:0.009] [N/A:N/A] Volume (cm) : [1R:94.00%] [N/A:N/A] % Reduction in A [1R:rea: 96.20%] [N/A:N/A] % Reduction in Volume: [1R:Full Thickness Without Exposed] [N/A:N/A] Classification: [1R:Support Structures Medium] [N/A:N/A] Exudate A mount: [1R:Serosanguineous] [N/A:N/A] Exudate Type: [1R:red, brown] [N/A:N/A] Exudate Color: [1R:Distinct, outline attached] [N/A:N/A] Wound Margin: [1R:Large (67-100%)] [N/A:N/A] Granulation A mount: [1R:Red] [N/A:N/A] Granulation Quality: [1R:None Present (0%)] [N/A:N/A] Necrotic A mount: [1R:Fat Layer (Subcutaneous Tissue): Yes N/A] Exposed Structures: [1R:Fascia: No Tendon: No Muscle: No  Joint: No Bone: No Large (67-100%)] [N/A:N/A] Epithelialization: [1R:Debridement - Excisional] [N/A:N/A] Debridement: Pre-procedure Verification/Time Out 11:35 [N/A:N/A] Taken: [1R:Other] [N/A:N/A] Pain Control: [1R:Callus, Subcutaneous] [N/A:N/A] Tissue Debrided: [1R:Skin/Subcutaneous Tissue] [N/A:N/A] Level: [1R:0.09] [N/A:N/A] Debridement A (sq cm): [1R:rea Curette] [N/A:N/A] Instrument: [1R:Minimum] [N/A:N/A] Bleeding: [1R:Pressure] [N/A:N/A] Hemostasis A chieved: [1R:0] [N/A:N/A] Procedural Pain: [1R:0] [N/A:N/A] Post Procedural Pain: [1R:Procedure was tolerated well] [N/A:N/A] Debridement Treatment Response: [1R:0.3x0.3x0.2] [N/A:N/A] Post Debridement Measurements L x W x D (cm) [1R:0.014] [N/A:N/A] Post Debridement Volume: (cm) [1R:dried blood blister noted distal from] [N/A:N/A] Assessment Notes: [1R:wound. Debridement] [N/A:N/A] Treatment Notes Wound #1R (Amputation Site - Transmetatarsal) Wound Laterality: Plantar, Right Cleanser Peri-Wound Care Topical Primary Dressing KerraCel Ag Gelling Fiber Dressing, 2x2 in (silver alginate) Discharge Instruction: Apply silver alginate to wound bed as instructed Secondary Dressing Woven Gauze Sponges 2x2 in Discharge Instruction: Apply over primary dressing as directed. Optifoam Non-Adhesive Dressing, 4x4 in Discharge Instruction: Apply over primary dressing cut to make foam donut Secured With [employer] Sterile, 4.5x3.1 (in/yd) Discharge Instruction: Secure with Kerlix as directed. Compression Wrap Compression Stockings Add-Ons Electronic Signature(s) Signed: 09/24/2020 1:42:28 PM By: Kalman Shan DO Signed: 09/24/2020 6:12:56 PM By: Baruch Gouty RN, BSN Entered By: Kalman Shan on 09/24/2020 13:35:21 -------------------------------------------------------------------------------- Multi-Disciplinary Care Plan Details Patient Name: Date of Service: [name] Shutter D. 09/24/2020 10:15 A M Medical Record Number:  [id number] Patient Account Number: [id number] Date of Birth/Sex: Treating RN: 05/13/1965 (55 y.o. [name] Primary Care Selma Rodelo: Jenna Luo Other Clinician: Referring Juel Bellerose: Treating Mischa Brittingham/Extender: Michel Bickers in Treatment: [place] reviewed with physician Active Inactive Abuse / Safety / Falls / Self Care Management Nursing Diagnoses: History of Falls Potential for injury related to falls Goals: Patient will not experience any injury related to falls Date Initiated: 06/02/2020 Date Inactivated: 07/04/2020 Target Resolution Date: 07/04/2020 Goal Status: Met Patient/caregiver will verbalize/demonstrate measures taken to prevent injury and/or falls Date Initiated: 06/02/2020 Target Resolution Date: 10/22/2020 Goal Status: Active Interventions: Assess Activities of Daily Living upon admission and as needed Assess fall risk on admission and as needed Assess: immobility, friction, shearing, incontinence upon admission and as needed Assess impairment of mobility on admission and as needed per policy Assess personal safety and home safety (as indicated) on admission and as needed Assess self care needs on admission and as needed Provide education on personal and home safety Notes: Wound/Skin Impairment Nursing Diagnoses: Impaired tissue integrity Knowledge deficit related to ulceration/compromised skin integrity Goals: Patient/caregiver will verbalize understanding of skin care regimen Date Initiated:  06/02/2020 Date Inactivated: 09/02/2020 Target Resolution Date: 09/12/2020 Goal Status: Met Ulcer/skin breakdown will have a volume reduction of 30% by week 4 Date Initiated: 06/02/2020 Target Resolution Date: 10/22/2020 Goal Status: Active Interventions: Assess patient/caregiver ability to obtain necessary supplies Assess patient/caregiver ability to perform ulcer/skin care regimen upon admission and as needed Assess  ulceration(s) every visit Provide education on ulcer and skin care Notes: Electronic Signature(s) Signed: 09/24/2020 6:12:56 PM By: Baruch Gouty RN, BSN Entered By: Baruch Gouty on 09/24/2020 11:35:27 -------------------------------------------------------------------------------- Pain Assessment Details Patient Name: Date of Service: [name] Shutter D. 09/24/2020 10:15 A M Medical Record Number: [id number] Patient Account Number: [id number] Date of Birth/Sex: Treating RN: 02/04/1966 (55 y.o. [name] Primary Care Ronny Korff: Jenna Luo Other Clinician: Referring Ludie Pavlik: Treating Maureen Delatte/Extender: Michel Bickers in Treatment: 16 Active Problems Location of Pain Severity and Description of Pain Patient Has Paino No Site Locations Rate the pain. Rate the pain. Current Pain Level: 0 Pain Management and Medication Current Pain Management: Medication: No Cold Application: No Rest: No Massage: No Activity: No T.E.N.S.: No Heat Application: No Leg drop or elevation: No Is the Current Pain Management Adequate: Adequate How does your wound impact your activities of daily livingo Sleep: No Bathing: No Appetite: No Relationship With Others: No Bladder Continence: No Emotions: No Bowel Continence: No Work: No Toileting: No Drive: No Dressing: No Hobbies: No Electronic Signature(s) Signed: 09/24/2020 6:05:11 PM By: Deon Pilling Entered By: Deon Pilling on 09/24/2020 10:51:40 -------------------------------------------------------------------------------- Patient/Caregiver Education Details Patient Name: Date of Service: [name] [name] 6/8/2022andnbsp10:15 A M Medical Record Number: [id number] Patient Account Number: [id number] Date of Birth/Gender: Treating RN: Feb 24, 1966 (55 y.o. [name] Primary Care Physician: Jenna Luo Other Clinician: Referring Physician: Treating Physician/Extender: Michel Bickers in Treatment: 16 Education Assessment Education Provided To: Patient Education Topics Provided Offloading: Methods: Explain/Verbal Responses: Reinforcements needed, State content correctly Wound/Skin Impairment: Methods: Explain/Verbal Responses: Reinforcements needed, State content correctly Electronic Signature(s) Signed: 09/24/2020 6:12:56 PM By: Baruch Gouty RN, BSN Signed: 09/24/2020 6:12:56 PM By: Baruch Gouty RN, BSN Entered By: Baruch Gouty on 09/24/2020 11:38:31 -------------------------------------------------------------------------------- Wound Assessment Details Patient Name: Date of Service: [name] Shutter D. 09/24/2020 10:15 A M Medical Record Number: [id number] Patient Account Number: [id number] Date of Birth/Sex: Treating RN: 1965-09-02 (55 y.o. Helene Shoe, Meta.Reding Primary Care Glendine Swetz: Jenna Luo Other Clinician: Referring Hoke Baer: Treating Chinonso Linker/Extender: Michel Bickers in Treatment: 16 Wound Status Wound Number: 1R Primary Neuropathic Ulcer-Non Diabetic Etiology: Wound Location: Right, Plantar Amputation Site - Transmetatarsal Wound Open Wounding Event: Gradually Appeared Status: Date Acquired: 03/05/2020 Comorbid Asthma, Sleep Apnea, Hypertension, Osteoarthritis, Weeks Of Treatment: 16 History: Neuropathy, Seizure Disorder Clustered Wound: No Photos Wound Measurements Length: (cm) 0.3 Width: (cm) 0.2 Depth: (cm) 0.2 Area: (cm) 0.047 Volume: (cm) 0.009 % Reduction in Area: 94% % Reduction in Volume: 96.2% Epithelialization: Large (67-100%) Tunneling: No Undermining: No Wound Description Classification: Full Thickness Without Exposed Support Structures Wound Margin: Distinct, outline attached Exudate Amount: Medium Exudate Type: Serosanguineous Exudate Color: red, brown Foul Odor After Cleansing: No Slough/Fibrino No Wound Bed Granulation Amount: Large (67-100%) Exposed  Structure Granulation Quality: Red Fascia Exposed: No Necrotic Amount: None Present (0%) Fat Layer (Subcutaneous Tissue) Exposed: Yes Tendon Exposed: No Muscle Exposed: No Joint Exposed: No Bone Exposed: No Assessment Notes dried blood blister noted distal from wound. Treatment Notes Wound #1R (Amputation Site - Transmetatarsal) Wound Laterality: Plantar, Right Cleanser Peri-Wound Care Topical Primary Dressing KerraCel Ag Gelling Fiber Dressing, 2x2 in (  silver alginate) Discharge Instruction: Apply silver alginate to wound bed as instructed Secondary Dressing Woven Gauze Sponges 2x2 in Discharge Instruction: Apply over primary dressing as directed. Optifoam Non-Adhesive Dressing, 4x4 in Discharge Instruction: Apply over primary dressing cut to make foam donut Secured With [employer] Sterile, 4.5x3.1 (in/yd) Discharge Instruction: Secure with Kerlix as directed. Compression Wrap Compression Stockings Add-Ons Electronic Signature(s) Signed: 09/24/2020 5:19:11 PM By: Sandre Kitty Signed: 09/24/2020 6:05:11 PM By: Deon Pilling Entered By: Sandre Kitty on 09/24/2020 17:12:27 -------------------------------------------------------------------------------- Vitals Details Patient Name: Date of Service: [name] Shutter D. 09/24/2020 10:15 A M Medical Record Number: [id number] Patient Account Number: [id number] Date of Birth/Sex: Treating RN: 11-08-1965 (55 y.o. Helene Shoe, Tammi Klippel Primary Care Kaylib Furness: Jenna Luo Other Clinician: Referring Ferrah Panagopoulos: Treating Nellie Chevalier/Extender: Michel Bickers in Treatment: 16 Vital Signs Time Taken: 10:43 Temperature (F): 97.8 Height (in): 64 Pulse (bpm): 78 Weight (lbs): 228 Respiratory Rate (breaths/min): 16 Body Mass Index (BMI): 39.1 Blood Pressure (mmHg): 134/88 Reference Range: 80 - 120 mg / dl Electronic Signature(s) Signed: 09/24/2020 6:05:11 PM By: Deon Pilling Entered By: Deon Pilling on  09/24/2020 10:51:18

## 2020-09-25 ENCOUNTER — Telehealth: Payer: Self-pay | Admitting: Podiatry

## 2020-09-25 NOTE — Telephone Encounter (Signed)
Per Baltazar Najjar at [place] is valid and billable and not Josem Kaufmann is needed covered @ 80% no deductible applies. Out of pocket is 7550.00.Marland Kitchen ref # [id number]

## 2020-09-25 NOTE — Telephone Encounter (Signed)
Discussed with Dr Jacqualyn Posey and we are going to proceed with the L5000 like we did last year and not bill for the shoes.   I made patient aware since I was out of the office when pt seen EJ.

## 2020-09-26 NOTE — Progress Notes (Signed)
Patient seen by EJ with [facility] today for diabetic shoe measurement. Patient was measured for diabetic shoes and shoe selection was made by patient. Office will call patient when shoes are ready for pick-up.

## 2020-10-01 ENCOUNTER — Other Ambulatory Visit: Payer: Self-pay

## 2020-10-01 ENCOUNTER — Encounter (HOSPITAL_BASED_OUTPATIENT_CLINIC_OR_DEPARTMENT_OTHER): Payer: Medicare Other | Admitting: Physician Assistant

## 2020-10-01 DIAGNOSIS — Z899 Acquired absence of limb, unspecified: Secondary | ICD-10-CM | POA: Diagnosis not present

## 2020-10-01 DIAGNOSIS — Z89422 Acquired absence of other left toe(s): Secondary | ICD-10-CM | POA: Diagnosis not present

## 2020-10-01 DIAGNOSIS — G629 Polyneuropathy, unspecified: Secondary | ICD-10-CM | POA: Diagnosis not present

## 2020-10-01 DIAGNOSIS — G9009 Other idiopathic peripheral autonomic neuropathy: Secondary | ICD-10-CM | POA: Diagnosis not present

## 2020-10-01 DIAGNOSIS — L97512 Non-pressure chronic ulcer of other part of right foot with fat layer exposed: Secondary | ICD-10-CM | POA: Diagnosis not present

## 2020-10-01 NOTE — Progress Notes (Addendum)
[name] [name] ([id number]) Visit Report for 10/01/2020 Chief Complaint Document Details Patient Name: Date of Service: [name] [name], [name] [name] 10/01/2020 3:30 PM Medical Record Number: [id number] Patient Account Number: [id number] Date of Birth/Sex: Treating RN: July 18, 1965 (55 y.o. [name] [name] Primary Care Provider: Jenna Luo Other Clinician: Referring Provider: Treating Provider/Extender: Christella Hartigan in Treatment: 17 Information Obtained from: Patient Chief Complaint 06/03/2019; patient is here for review of a wound on her plantar foot transmetatarsal amputation site at roughly the third met head Electronic Signature(s) Signed: 10/01/2020 3:51:23 PM By: Worthy Keeler PA-C Entered By: Worthy Keeler on 10/01/2020 15:51:23 -------------------------------------------------------------------------------- Debridement Details Patient Name: Date of Service: [name] Shutter D. 10/01/2020 3:30 PM Medical Record Number: [id number] Patient Account Number: [id number] Date of Birth/Sex: Treating RN: 1966/02/19 (55 y.o. [name] [name] Primary Care Provider: Jenna Luo Other Clinician: Referring Provider: Treating Provider/Extender: Christella Hartigan in Treatment: 17 Debridement Performed for Assessment: Wound #1R Right,Plantar Amputation Site - Transmetatarsal Performed By: Physician Worthy Keeler, PA Debridement Type: Debridement Level of Consciousness (Pre-procedure): Awake and Alert Pre-procedure Verification/Time Out Yes - 16:20 Taken: Start Time: 16:20 Pain Control: Lidocaine 5% topical ointment T Area Debrided (L x W): otal 0.8 (cm) x 0.5 (cm) = 0.4 (cm) Tissue and other material debrided: Non-Viable, Callus, Slough, Subcutaneous, Skin: Epidermis, Slough Level: Skin/Subcutaneous Tissue Debridement Description: Excisional Instrument: Curette Bleeding: Minimum Hemostasis Achieved: Pressure End Time:  16:26 Procedural Pain: 0 Post Procedural Pain: 0 Response to Treatment: Procedure was tolerated well Level of Consciousness (Post- Awake and Alert procedure): Post Debridement Measurements of Total Wound Length: (cm) 0.4 Width: (cm) 0.4 Depth: (cm) 1 Volume: (cm) 0.126 Character of Wound/Ulcer Post Debridement: Improved Post Procedure Diagnosis Same as Pre-procedure Electronic Signature(s) Signed: 10/01/2020 6:34:46 PM By: Baruch Gouty RN, BSN Signed: 10/01/2020 6:55:53 PM By: Worthy Keeler PA-C Entered By: Baruch Gouty on 10/01/2020 16:26:54 -------------------------------------------------------------------------------- HPI Details Patient Name: Date of Service: [name] Shutter D. 10/01/2020 3:30 PM Medical Record Number: [id number] Patient Account Number: [id number] Date of Birth/Sex: Treating RN: [birth year] (55 y.o. [name] [name] Primary Care Provider: Jenna Luo Other Clinician: Referring Provider: Treating Provider/Extender: Christella Hartigan in Treatment: 64 History of Present Illness HPI Description: ADMISSION 06/02/2020 This is a 55 year old woman who has had problems with wounds on her predominantly plantar right foot for quite a period of time. 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 More recently she has had an area developed in the right mid foot at roughly the third metatarsal head. She says this started as a blister of November. Its not been closing. More recently she has been using Silvadene cream she has a cam boot to offload. I note she walks with a cane The patient has had arterial studies done in 2019 at which time her ABI in the right was 1.28 with triphasic waveforms noncompressible on the left with triphasic waveform. Her ABI in our clinic today was 1.3 on the right. 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 She had a foot gastrocnemius recession sometime in the fall 2021 2/21; patient arrives today with a wound measuring slightly smaller 3 x 3 mm however there is circumferential area of erythema around this. She does  not have any complaints 2/28; plantar wound on the right first transmetatarsal amputation site. Small oval-shaped wound raised skin around the edges. We have been using Hydrofera Blue. She does not have arterial issues 06/23/2020 upon evaluation today patient appears to be doing decently well. With regard to her wound. There is little bit of slough and some hyper granulation. I do not feel like the PolyMem did too well for her. In fact she may do better with some Hydrofera Blue. I know we just switch from that but I feel like that may be a better way to continue currently. 3/15; this is a patient with a wound on the plantar right transmetatarsal amputation site probably the second metatarsal head not 1 July as stated previously we have been using polymen switch to [facility] last week. Wound looks about the same to me. We are going to start her on a total contact cast today we have the boot to fit 3/18; first total contact cast change she tolerated this well 3/31; she comes back in with out total contact cast on for 10 days. In spite of this the small wound over the third metatarsal head of her right transmetatarsal amputation site almost looks the same slightly hyper granulated. Circumference is not well adhered. We have been using Hydrofera Blue 4/7 she comes back in in her total contact cast. The wound is smaller epithelializing and looks like it on its way to closing. We have been using Hydrofera Blue over the wound area She has a right transmetatarsal amputation. She tells [place] that she has been to [facility] in the past and they did a modified shoe for her  however it did not keep this area epithelialized. She is going to bring that in next week for [place] to look at. It does not sound however that she was really offloading or padding this area at all 4/14; total contact cast. I changed her to calcium alginate today. I think most of the small area is epithelialized although there is clearly not complete epithelialization and even the area that is epithelialized and the small wound is very tiny She shows me her shoe with the insert. It looks as though the insert probably was too large for her transmet it foot allowing it to move back and forward. I can see where the friction was in the blister she describes forming. I have asked her to take this back to [facility] to see if there is anything they can do 4/21; patient presents for her 1 week follow-up. She has been treated with a total contact cast weekly. She has no complaints today. She denies any fever/chills. 4/26; patient presents for 1 week follow-up in total contact cast change. Patient is having knee pain to her right knee due to chronic osteoarthritis. She is hoping to be able to wear her brace over the cast. 5/10; the wound is closed over however still looks vulnerable. 5/17; the wound remains closed surface looks better. She has a previous right transmet. She is going to convert this into a sneaker. She had a forefoot prosthesis for the foot but not sure that that are too much good she is supposed to have taken aback from modification however she can do that now. 6/8; patient returns to our clinic for reopening of the plantar right foot transmetatarsal amputation site. She has slight warmth and erythema to the area. She would like to try conservative wound care and not do the total  contact cast that was she was doing before at this time 10/01/2020 upon evaluation today patient appears to be doing okay in regard to her foot ulcer. Fortunately there does not appear to be any signs of  active infection which is great news and overall very pleased with where things stand today. The wound does appear to be doing well and overall appearance wise is significantly improved compared to last week in my opinion based on my review of her pictures today. Overall the patient tells me that she is not having any significant pain which is great news as well. Electronic Signature(s) Signed: 10/01/2020 4:30:17 PM By: Worthy Keeler PA-C Entered By: Worthy Keeler on 10/01/2020 16:30:16 -------------------------------------------------------------------------------- Physical Exam Details Patient Name: Date of Service: [name] [name], [name] [name] 10/01/2020 3:30 PM Medical Record Number: [id number] Patient Account Number: [id number] Date of Birth/Sex: Treating RN: [birth year] (56 y.o. [name] [name] Primary Care Provider: Jenna Luo Other Clinician: Referring Provider: Treating Provider/Extender: Christella Hartigan in Treatment: 72 Constitutional Well-nourished and well-hydrated in no acute distress. Respiratory normal breathing without difficulty. Psychiatric this patient is able to make decisions and demonstrates good insight into disease process. Alert and Oriented x 3. pleasant and cooperative. Notes Patient's wound bed did require sharp debridement clear away some of the necrotic debris she tolerated that debridement today without complication postdebridement wound bed appears to be doing significantly better which is great news and overall very pleased With where things stand at this point. Electronic Signature(s) Signed: 10/01/2020 4:31:20 PM By: Worthy Keeler PA-C Entered By: Worthy Keeler on 10/01/2020 16:31:19 -------------------------------------------------------------------------------- Physician Orders Details Patient Name: Date of Service: [name] Shutter D. 10/01/2020 3:30 PM Medical Record Number: [id number] Patient Account Number:  [id number] Date of Birth/Sex: Treating RN: 18-Dec-1965 (55 y.o. [name] [name] Primary Care Provider: Jenna Luo Other Clinician: Referring Provider: Treating Provider/Extender: Christella Hartigan in Treatment: [id number] Verbal / Phone Orders: No Diagnosis Coding ICD-10 Coding Code Description L97.512 Non-pressure chronic ulcer of other part of right foot with fat layer exposed G90.09 Other idiopathic peripheral autonomic neuropathy Follow-up Appointments ppointment in 1 week. - with Dr. Dellia Nims Return A Bathing/ Shower/ Hygiene May shower and wash wound with soap and water. - with dressing changes Edema Control - Lymphedema / SCD / Other Bilateral Lower Extremities Elevate legs to the level of the heart or above for 30 minutes daily and/or when sitting, a frequency of: Avoid standing for long periods of time. Moisturize legs daily. - and feet nightly Off-Loading Other: - custom diabetic shoes Wound Treatment Wound #1R - Amputation Site - Transmetatarsal Wound Laterality: Plantar, Right Cleanser: Normal Saline (DME) (Generic) 1 x Per Day/30 Days Discharge Instructions: Cleanse the wound with Normal Saline prior to applying a clean dressing using gauze sponges, not tissue or cotton balls. Prim Dressing: Promogran Prisma Matrix, 4.34 (sq in) (silver collagen) 1 x Per Day/30 Days ary Discharge Instructions: Moisten collagen with saline or hydrogel Secondary Dressing: Woven Gauze Sponges 2x2 in (DME) (Generic) 1 x Per Day/30 Days Discharge Instructions: Apply over primary dressing as directed. Secondary Dressing: Optifoam Non-Adhesive Dressing, 4x4 in (DME) (Generic) 1 x Per Day/30 Days Discharge Instructions: Apply over primary dressing cut to make foam donut Secured With: Kerlix Roll Sterile, 4.5x3.1 (in/yd) (DME) (Generic) 1 x Per Day/30 Days Discharge Instructions: Secure with Kerlix as directed. Secured With: Paper Tape, 2x10 (in/yd) (DME) (Generic) 1 x Per  Day/30 Days Discharge Instructions: Secure dressing with tape as directed.  Electronic Signature(s) Signed: 10/01/2020 6:34:46 PM By: Baruch Gouty RN, BSN Signed: 10/01/2020 6:55:53 PM By: Worthy Keeler PA-C Entered By: Baruch Gouty on 10/01/2020 16:29:03 -------------------------------------------------------------------------------- Problem List Details Patient Name: Date of Service: [name] Shutter D. 10/01/2020 3:30 PM Medical Record Number: [id number] Patient Account Number: [id number] Date of Birth/Sex: Treating RN: [birth year] (55 y.o. [name] [name] Primary Care Provider: Jenna Luo Other Clinician: Referring Provider: Treating Provider/Extender: Christella Hartigan in Treatment: 17 Active Problems ICD-10 Encounter Code Description Active Date MDM Diagnosis L97.512 Non-pressure chronic ulcer of other part of right foot with fat layer exposed 06/02/2020 No Yes G90.09 Other idiopathic peripheral autonomic neuropathy 06/02/2020 No Yes Inactive Problems ICD-10 Code Description Active Date Inactive Date L03.115 Cellulitis of right lower limb 06/09/2020 06/09/2020 Resolved Problems Electronic Signature(s) Signed: 10/01/2020 3:51:17 PM By: Worthy Keeler PA-C Entered By: Worthy Keeler on 10/01/2020 15:51:17 -------------------------------------------------------------------------------- Progress Note Details Patient Name: Date of Service: [name] Shutter D. 10/01/2020 3:30 PM Medical Record Number: [id number] Patient Account Number: [id number] Date of Birth/Sex: Treating RN: 1966/01/21 (55 y.o. [name] [name] Primary Care Provider: Jenna Luo Other Clinician: Referring Provider: Treating Provider/Extender: Christella Hartigan in Treatment: 56 Subjective Chief Complaint Information obtained from Patient 06/03/2019; patient is here for review of a wound on her plantar foot transmetatarsal amputation site at roughly the  third met head History of Present Illness (HPI) ADMISSION 06/02/2020 This is a 55 year old woman who has had problems with wounds on her predominantly plantar right foot for quite a period of time. 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 More recently she has had an area developed in the right mid foot at roughly the third metatarsal head. She says this started as a blister of November. Its not been closing. More recently she has been using Silvadene cream she has a cam boot to offload. I note she walks with a cane The patient has had arterial studies done in 2019 at which time her ABI in the right was 1.28 with triphasic waveforms noncompressible on the left with triphasic waveform. Her ABI in our clinic today was 1.3 on the right. 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 She had a foot gastrocnemius recession sometime in the fall 2021 2/21; patient arrives today with a wound measuring slightly smaller 3 x 3 mm however there is circumferential area of erythema around this. She does not have any complaints 2/28; plantar wound on the right first transmetatarsal amputation site. Small oval-shaped wound raised skin around the edges. We have been using Hydrofera Blue. She does not have arterial issues 06/23/2020 upon evaluation today patient appears to be doing decently well. With regard to her wound. There is little bit of slough and some hyper granulation. I do not feel like the PolyMem did too well for her. In fact she may do better with some Hydrofera Blue. I know we just switch from that but I feel like that may be a better way to continue currently. 3/15; this is a patient with a wound on the plantar right transmetatarsal amputation site probably the second metatarsal  head not 1 July as stated previously we have been using polymen switch to [facility] last week. Wound looks about the same  to me. We are going to start her on a total contact cast today we have the boot to fit 3/18; first total contact cast change she tolerated this well 3/31; she comes back in with out total contact cast on for 10 days. In spite of this the small wound over the third metatarsal head of her right transmetatarsal amputation site almost looks the same slightly hyper granulated. Circumference is not well adhered. We have been using Hydrofera Blue 4/7 she comes back in in her total contact cast. The wound is smaller epithelializing and looks like it on its way to closing. We have been using Hydrofera Blue over the wound area She has a right transmetatarsal amputation. She tells [place] that she has been to [facility] in the past and they did a modified shoe for her however it did not keep this area epithelialized. She is going to bring that in next week for [place] to look at. It does not sound however that she was really offloading or padding this area at all 4/14; total contact cast. I changed her to calcium alginate today. I think most of the small area is epithelialized although there is clearly not complete epithelialization and even the area that is epithelialized and the small wound is very tiny She shows me her shoe with the insert. It looks as though the insert probably was too large for her transmet it foot allowing it to move back and forward. I can see where the friction was in the blister she describes forming. I have asked her to take this back to [facility] to see if there is anything they can do 4/21; patient presents for her 1 week follow-up. She has been treated with a total contact cast weekly. She has no complaints today. She denies any fever/chills. 4/26; patient presents for 1 week follow-up in total contact cast change. Patient is having knee pain to  her right knee due to chronic osteoarthritis. She is hoping to be able to wear her brace over the cast. 5/10; the wound is closed over however still looks vulnerable. 5/17; the wound remains closed surface looks better. She has a previous right transmet. She is going to convert this into a sneaker. She had a forefoot prosthesis for the foot but not sure that that are too much good she is supposed to have taken aback from modification however she can do that now. 6/8; patient returns to our clinic for reopening of the plantar right foot transmetatarsal amputation site. She has slight warmth and erythema to the area. She would like to try conservative wound care and not do the total contact cast that was she was doing before at this time 10/01/2020 upon evaluation today patient appears to be doing okay in regard to her foot ulcer. Fortunately there does not appear to be any signs of active infection which is great news and overall very pleased with where things stand today. The wound does appear to be doing well and overall appearance wise is significantly improved compared to last week in my opinion based on my review of her pictures today. Overall the patient tells me that she is not having any significant pain which is great news as well. Objective Constitutional Well-nourished and well-hydrated in no acute distress. Vitals Time Taken: 3:24 PM, Height: 64 in, Weight: 228 lbs, BMI: 39.1, Temperature: 98.0 F, Pulse: 73 bpm, Respiratory Rate: 16 breaths/min, Blood Pressure: 121/78 mmHg. Respiratory normal breathing without difficulty. Psychiatric this  patient is able to make decisions and demonstrates good insight into disease process. Alert and Oriented x 3. pleasant and cooperative. General Notes: Patient's wound bed did require sharp debridement clear away some of the necrotic debris she tolerated that debridement today without complication postdebridement wound bed appears to be doing  significantly better which is great news and overall very pleased With where things stand at this point. Integumentary (Hair, Skin) Wound #1R status is Open. Original cause of wound was Gradually Appeared. The date acquired was: 03/05/2020. The wound has been in treatment 17 weeks. The wound is located on the Right,Plantar Amputation Site - Transmetatarsal. The wound measures 0.2cm length x 0.2cm width x 0.1cm depth; 0.031cm^2 area and 0.003cm^3 volume. There is Fat Layer (Subcutaneous Tissue) exposed. There is no tunneling or undermining noted. There is a medium amount of serosanguineous drainage noted. The wound margin is distinct with the outline attached to the wound base. There is large (67-100%) red granulation within the wound bed. There is no necrotic tissue within the wound bed. Assessment Active Problems ICD-10 Non-pressure chronic ulcer of other part of right foot with fat layer exposed Other idiopathic peripheral autonomic neuropathy Procedures Wound #1R Pre-procedure diagnosis of Wound #1R is a Neuropathic Ulcer-Non Diabetic located on the Right,Plantar Amputation Site - Transmetatarsal . There was a Excisional Skin/Subcutaneous Tissue Debridement with a total area of 0.4 sq cm performed by Worthy Keeler, PA. With the following instrument(s): Curette to remove Non-Viable tissue/material. Material removed includes Callus, Subcutaneous Tissue, Slough, and Skin: Epidermis after achieving pain control using Lidocaine 5% topical ointment. No specimens were taken. A time out was conducted at 16:20, prior to the start of the procedure. A Minimum amount of bleeding was controlled with Pressure. The procedure was tolerated well with a pain level of 0 throughout and a pain level of 0 following the procedure. Post Debridement Measurements: 0.4cm length x 0.4cm width x 1cm depth; 0.126cm^3 volume. Character of Wound/Ulcer Post Debridement is improved. Post procedure Diagnosis Wound #1R: Same  as Pre-Procedure Plan Follow-up Appointments: Return Appointment in 1 week. - with Dr. Dellia Nims Bathing/ Shower/ Hygiene: May shower and wash wound with soap and water. - with dressing changes Edema Control - Lymphedema / SCD / Other: Elevate legs to the level of the heart or above for 30 minutes daily and/or when sitting, a frequency of: Avoid standing for long periods of time. Moisturize legs daily. - and feet nightly Off-Loading: Other: - custom diabetic shoes WOUND #1R: - Amputation Site - Transmetatarsal Wound Laterality: Plantar, Right Cleanser: Normal Saline (DME) (Generic) 1 x Per Day/30 Days Discharge Instructions: Cleanse the wound with Normal Saline prior to applying a clean dressing using gauze sponges, not tissue or cotton balls. Prim Dressing: Promogran Prisma Matrix, 4.34 (sq in) (silver collagen) 1 x Per Day/30 Days ary Discharge Instructions: Moisten collagen with saline or hydrogel Secondary Dressing: Woven Gauze Sponges 2x2 in (DME) (Generic) 1 x Per Day/30 Days Discharge Instructions: Apply over primary dressing as directed. Secondary Dressing: Optifoam Non-Adhesive Dressing, 4x4 in (DME) (Generic) 1 x Per Day/30 Days Discharge Instructions: Apply over primary dressing cut to make foam donut Secured With: Kerlix Roll Sterile, 4.5x3.1 (in/yd) (DME) (Generic) 1 x Per Day/30 Days Discharge Instructions: Secure with Kerlix as directed. Secured With: Paper Tape, 2x10 (in/yd) (DME) (Generic) 1 x Per Day/30 Days Discharge Instructions: Secure dressing with tape as directed. 1. I am going to recommend at this time that we have the patient go ahead and continue to  utilize the border foam dressing I think this is can be the right thing to do. 2. I am also can recommend at this time that we see about going ahead and using silver collagen I think this to be better than the alginate to be honest based on what I am seeing. 3. I am also can recommend the patient continue to try to  avoid excessive walking obviously unless she can walk better in my opinion. Obviously the more walking she does the more damage she is potentially get a cause to the foot. We will see patient back for reevaluation in 1 week here in the clinic. If anything worsens or changes patient will contact our office for additional recommendations. Electronic Signature(s) Signed: 10/01/2020 4:31:55 PM By: Worthy Keeler PA-C Entered By: Worthy Keeler on 10/01/2020 16:31:55 -------------------------------------------------------------------------------- SuperBill Details Patient Name: Date of Service: [name] Shutter D. 10/01/2020 Medical Record Number: [id number] Patient Account Number: [id number] Date of Birth/Sex: Treating RN: 1965/07/21 (55 y.o. [name] [name] Primary Care Provider: Jenna Luo Other Clinician: Referring Provider: Treating Provider/Extender: Christella Hartigan in Treatment: 17 Diagnosis Coding ICD-10 Codes Code Description [phone] Non-pressure chronic ulcer of other part of right foot with fat layer exposed G90.09 Other idiopathic peripheral autonomic neuropathy Facility Procedures CPT4 Code: 83729021 Description: 11552 - DEB SUBQ TISSUE 20 SQ CM/< ICD-10 Diagnosis Description L97.512 Non-pressure chronic ulcer of other part of right foot with fat layer exposed Modifier: Quantity: 1 Physician Procedures : CPT4 Code Description Modifier [id number] - WC PHYS SUBQ TISS 20 SQ CM ICD-10 Diagnosis Description L97.512 Non-pressure chronic ulcer of other part of right foot with fat layer exposed Quantity: 1 Electronic Signature(s) Signed: 10/01/2020 4:32:05 PM By: Worthy Keeler PA-C Entered By: Worthy Keeler on 10/01/2020 16:32:04

## 2020-10-02 DIAGNOSIS — G894 Chronic pain syndrome: Secondary | ICD-10-CM | POA: Diagnosis not present

## 2020-10-02 DIAGNOSIS — M47816 Spondylosis without myelopathy or radiculopathy, lumbar region: Secondary | ICD-10-CM | POA: Diagnosis not present

## 2020-10-02 DIAGNOSIS — M5030 Other cervical disc degeneration, unspecified cervical region: Secondary | ICD-10-CM | POA: Diagnosis not present

## 2020-10-02 DIAGNOSIS — M533 Sacrococcygeal disorders, not elsewhere classified: Secondary | ICD-10-CM | POA: Diagnosis not present

## 2020-10-02 NOTE — Progress Notes (Addendum)
[name], [name] ([id number]) Visit Report for 10/01/2020 Arrival Information Details Patient Name: Date of Service: [name] [name], [name] [name] 10/01/2020 3:30 PM Medical Record Number: [id number] Patient Account Number: [id number] Date of Birth/Sex: Treating RN: 02-Oct-1965 (55 y.o. [name] [name], [name] Primary Care Skarlet Lyons: Jenna Luo Other Clinician: Referring Chia Mowers: Treating Terren Haberle/Extender: Christella Hartigan in Treatment: 21 Visit Information History Since Last Visit Added or deleted any medications: No Patient Arrived: [name] Any new allergies or adverse reactions: No Arrival Time: 15:23 Had a fall or experienced change in No Accompanied By: self activities of daily living that may affect Transfer Assistance: None risk of falls: Patient Identification Verified: Yes Signs or symptoms of abuse/neglect since last visito No Secondary Verification Process Completed: Yes Hospitalized since last visit: No Patient Requires Transmission-Based Precautions: No Implantable device outside of the clinic excluding No Patient Has Alerts: No cellular tissue based products placed in the center since last visit: Has Dressing in Place as Prescribed: Yes Pain Present Now: No Electronic Signature(s) Signed: 10/02/2020 10:52:16 AM By: Sandre Kitty Entered By: Sandre Kitty on 10/01/2020 15:24:20 -------------------------------------------------------------------------------- Lower Extremity Assessment Details Patient Name: Date of Service: [name], [name] 10/01/2020 3:30 PM Medical Record Number: [id number] Patient Account Number: [id number] Date of Birth/Sex: Treating RN: Mar 23, 1966 (55 y.o. [name] [name] Primary Care Mikael Debell: Jenna Luo Other Clinician: Referring Bardia Wangerin: Treating Kylle Lall/Extender: Christella Hartigan in Treatment: 17 Edema Assessment Assessed: [Left: No] Patrice Paradise: Yes] Edema: [Left: Ye] [Right: s] Calf Left:  Right: Point of Measurement: 34 cm From Medial Instep 39.7 cm Ankle Left: Right: Point of Measurement: 9 cm From Medial Instep 24 cm Vascular Assessment Pulses: Dorsalis Pedis Palpable: [Right:Yes] Electronic Signature(s) Signed: 10/02/2020 5:31:56 PM By: Deon Pilling Entered By: Deon Pilling on 10/01/2020 15:38:59 -------------------------------------------------------------------------------- [place] Details Patient Name: Date of Service: [name] Shutter D. 10/01/2020 3:30 PM Medical Record Number: [id number] Patient Account Number: [id number] Date of Birth/Sex: Treating RN: 11-21-1965 (54 y.o. [name] Primary Care Javonte Elenes: Jenna Luo Other Clinician: Referring Shekita Boyden: Treating Valentine Barney/Extender: Christella Hartigan in Treatment: [place] reviewed with physician Active Inactive Electronic Signature(s) Signed: 01/15/2021 3:07:54 PM By: Baruch Gouty RN, BSN Previous Signature: 10/01/2020 6:34:46 PM Version By: Baruch Gouty RN, BSN Entered By: Baruch Gouty on 11/04/2020 17:37:58 -------------------------------------------------------------------------------- Pain Assessment Details Patient Name: Date of Service: [name] Shutter D. 10/01/2020 3:30 PM Medical Record Number: [id number] Patient Account Number: [id number] Date of Birth/Sex: Treating RN: 09-Nov-1965 (55 y.o. [name] Primary Care Teancum Brule: Jenna Luo Other Clinician: Referring Makailey Hodgkin: Treating Shirlette Scarber/Extender: Christella Hartigan in Treatment: 17 Active Problems Location of Pain Severity and Description of Pain Patient Has Paino No Site Locations Pain Management and Medication Current Pain Management: Electronic Signature(s) Signed: 10/01/2020 6:34:46 PM By: Baruch Gouty RN, BSN Signed: 10/02/2020 10:52:16 AM By: Sandre Kitty Entered By: Sandre Kitty on 10/01/2020  15:24:46 -------------------------------------------------------------------------------- Patient/Caregiver Education Details Patient Name: Date of Service: [name] 6/15/2022andnbsp3:30 PM Medical Record Number: [id number] Patient Account Number: [id number] Date of Birth/Gender: Treating RN: [birth year] (55 y.o. [name] Primary Care Physician: Jenna Luo Other Clinician: Referring Physician: Treating Physician/Extender: Christella Hartigan in Treatment: 17 Education Assessment Education Provided To: Patient Education Topics Provided Offloading: Methods: Explain/Verbal Responses: Reinforcements needed, State content correctly Wound/Skin Impairment: Methods: Explain/Verbal Responses: Reinforcements needed, State content correctly Electronic Signature(s) Signed: 10/01/2020 6:34:46 PM By: Baruch Gouty RN, BSN Entered By: Baruch Gouty on 10/01/2020 16:21:38 --------------------------------------------------------------------------------  Wound Assessment Details Patient Name: Date of Service: [name], [name] 10/01/2020 3:30 PM Medical Record Number: [id number] Patient Account Number: [id number] Date of Birth/Sex: Treating RN: 1965/11/16 (55 y.o. 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, Plantar Amputation Site - Transmetatarsal Wound Open Wounding Event: Gradually Appeared Status: Date Acquired: 03/05/2020 Comorbid Asthma, Sleep Apnea, Hypertension, Osteoarthritis, Weeks Of Treatment: 17 History: Neuropathy, Seizure Disorder Clustered Wound: No Photos Wound Measurements Length: (cm) 0.2 Width: (cm) 0.2 Depth: (cm) 0.1 Area: (cm) 0.031 Volume: (cm) 0.003 % Reduction in Area:  96.1% % Reduction in Volume: 98.7% Epithelialization: Large (67-100%) Tunneling: No Undermining: No Wound Description Classification: Full Thickness Without Exposed Support Structures Wound Margin: Distinct, outline attached Exudate Amount: Medium Exudate Type: Serosanguineous Exudate Color: red, brown Foul Odor After Cleansing: No Slough/Fibrino No Wound Bed Granulation Amount: Large (67-100%) Exposed Structure Granulation Quality: Red Fascia Exposed: No Necrotic Amount: None Present (0%) Fat Layer (Subcutaneous Tissue) Exposed: Yes Tendon Exposed: No Muscle Exposed: No Joint Exposed: No Bone Exposed: No Electronic Signature(s) Signed: 10/02/2020 5:21:02 PM By: Sandre Kitty Signed: 10/02/2020 5:31:32 PM By: Baruch Gouty RN, BSN Previous Signature: 10/01/2020 6:34:46 PM Version By: Baruch Gouty RN, BSN Entered By: Sandre Kitty on 10/02/2020 16:39:46 -------------------------------------------------------------------------------- [place] Details Patient Name: Date of Service: [name] Shutter D. 10/01/2020 3:30 PM Medical Record Number: [id number] Patient Account Number: [id number] Date of Birth/Sex: Treating RN: 1965/12/01 (55 y.o. [name] Primary Care Jacalynn Buzzell: Jenna Luo Other Clinician: Referring Jetaun Colbath: Treating Michale Emmerich/Extender: Christella Hartigan in Treatment: 17 Vital Signs Time Taken: 15:24 Temperature (F): 98.0 Height (in): 64 Pulse (bpm): 73 Weight (lbs): 228 Respiratory Rate (breaths/min): 16 Body Mass Index (BMI): 39.1 Blood Pressure (mmHg): 121/78 Reference Range: 80 - 120 mg / dl Electronic Signature(s) Signed: 10/02/2020 10:52:16 AM By: Sandre Kitty Entered By: Sandre Kitty on 10/01/2020 15:24:41

## 2020-10-03 DIAGNOSIS — S91209D Unspecified open wound of unspecified toe(s) with damage to nail, subsequent encounter: Secondary | ICD-10-CM | POA: Diagnosis not present

## 2020-10-06 ENCOUNTER — Telehealth: Payer: Self-pay

## 2020-10-07 ENCOUNTER — Other Ambulatory Visit: Payer: Self-pay | Admitting: Family Medicine

## 2020-10-07 DIAGNOSIS — S91209D Unspecified open wound of unspecified toe(s) with damage to nail, subsequent encounter: Secondary | ICD-10-CM | POA: Diagnosis not present

## 2020-10-07 MED ORDER — IVERMECTIN 0.5 % EX LOTN: 1.0000 "application " | TOPICAL_LOTION | Freq: Once | CUTANEOUS | 1 refills | Status: DC | PRN

## 2020-10-07 NOTE — Telephone Encounter (Signed)
PCP sent Ivermectin lotion.   Lice Information  All combs/brushes, hair pretties, headbands, barrettes should be bagged tightly and placed in the freezer for 12 hours, put in a mesh bag and placed in the dishwasher on the sanitize cycle or set aside (in the tightly closed bag) for 2 weeks. All glasses, goggles, earrings, hearing aids, phones and head phones should be wiped down gently with soap and water. All hats, jackets, sweaters, sporting gear should be washed (only if dirty). It is not the washing that kills the lice it is the high heat produced in the dryer. Place all things in the dryer on high heat for 40 min. If the fabric won't tolerate the high heat then place in the freezer for 12 hours or bag for  2 weeks. Cleaning bedrooms: strip all the sheets and mattress pads off and place in the dryer on high heat for 40 min. Vacuum the mattress and all areas surrounding the mattress. You will need to repeat this step daily for 2 weeks. Pillows can be placed in the dryer for the 40 min, placed in the freezer for 12 hours or bagged for 2 weeks. Car seats: Vacuum the car seats/headrests thoroughly. If you have leather seats wipe them off with a damp rag. If your child sits in a car seat/booster remove the seat cover and dry on high heat for 40 min, place in freezer for 12 hours or bag for 2 weeks. All dolls, blankets, stuffed animals and rugs should be put in the dryer on high heat for 40 min, placed in the freezer for 12 hours or bagged for 2 weeks.  Tips If it is not dirty, don't wash it! It is not the water that will kill the lice it is the high heat in the dryer. When using the dryer use the highest heat setting for 40 minutes. After you have vacuumed and/or wiped down the couch, chairs in your living area, place a flat sheet over them. At the end of each day you can remove the sheet and put in the dryer for 40 min. If your child plays in their closet or drawers and you feel that all the clothes  could be contaminated you can take out outfits for the next 5 days put them through the dryer on high heat for 40 min and tape off the closet and/or drawers for 5 days. ( Lice die after 48 hours without a host) If there are areas in your home that are rarely used, vacuum them and tape them off as an off-limits area for 5 days. If you have little girls try to keep their hair back in pony tails or braids. Inform the parents of the children that your child plays with frequently. Also inform your children's teacher so that they can make arrangements to get your child a single locker while going through the treatment process. Encourage your child not to share sporting gear, hats, coats, combs, wigs, dress up clothes etc.

## 2020-10-12 ENCOUNTER — Other Ambulatory Visit: Payer: Self-pay | Admitting: Nurse Practitioner

## 2020-10-12 DIAGNOSIS — U071 COVID-19: Secondary | ICD-10-CM

## 2020-10-12 MED ORDER — NIRMATRELVIR/RITONAVIR (PAXLOVID)TABLET: 3.0000 | ORAL_TABLET | Freq: Two times a day (BID) | ORAL | 0 refills | Status: AC

## 2020-10-12 NOTE — Progress Notes (Signed)
Patient paged on-call provider after hours.  Reports she tested positive for COVID-19 today.  Her symptoms started yesterday.  She is not having any severe symptoms including no shortness of breath or chest pain.  She has not received any of the COVID vaccines.  She is requesting oral antiviral therapy.  It appears most recent kidney function is normal with eGFR greater than 90.  We will start Paxlovid 3 tablets twice daily for 5 days.  Discussed with patient need to start within 5 days of symptom onset.  She should go to the emergency room with any sudden onset of shortness of breath or chest pain.

## 2020-10-14 ENCOUNTER — Telehealth: Payer: Self-pay

## 2020-10-14 ENCOUNTER — Other Ambulatory Visit: Payer: Self-pay | Admitting: Cardiovascular Disease

## 2020-10-15 ENCOUNTER — Ambulatory Visit: Payer: 59

## 2020-10-15 NOTE — Telephone Encounter (Signed)
Request sent to pt to call ins co to ask what medication is covered for head lice

## 2020-10-15 NOTE — Telephone Encounter (Signed)
Pt called to have medication given for lice switched to covered brand through insurance  Med given is Ivermectin 0.5 % LOTN . Cost is over $200. Pt given quotes of amounts using good [facility] as well

## 2020-10-16 ENCOUNTER — Encounter (HOSPITAL_BASED_OUTPATIENT_CLINIC_OR_DEPARTMENT_OTHER): Payer: Medicare Other | Admitting: Internal Medicine

## 2020-10-20 ENCOUNTER — Inpatient Hospital Stay (HOSPITAL_COMMUNITY)
Admission: EM | Admit: 2020-10-20 | Discharge: 2020-10-26 | DRG: 463 | Disposition: A | Discharge status: Home Health | Payer: Medicare Other | Attending: Family Medicine | Admitting: Family Medicine

## 2020-10-20 ENCOUNTER — Emergency Department (HOSPITAL_COMMUNITY): Payer: Medicare Other

## 2020-10-20 ENCOUNTER — Other Ambulatory Visit: Payer: Self-pay

## 2020-10-20 ENCOUNTER — Encounter (HOSPITAL_COMMUNITY): Payer: Self-pay

## 2020-10-20 DIAGNOSIS — E11621 Type 2 diabetes mellitus with foot ulcer: Secondary | ICD-10-CM | POA: Diagnosis not present

## 2020-10-20 DIAGNOSIS — Z825 Family history of asthma and other chronic lower respiratory diseases: Secondary | ICD-10-CM

## 2020-10-20 DIAGNOSIS — I10 Essential (primary) hypertension: Secondary | ICD-10-CM | POA: Diagnosis not present

## 2020-10-20 DIAGNOSIS — U071 COVID-19: Secondary | ICD-10-CM | POA: Diagnosis not present

## 2020-10-20 DIAGNOSIS — L02611 Cutaneous abscess of right foot: Secondary | ICD-10-CM | POA: Diagnosis not present

## 2020-10-20 DIAGNOSIS — S91301A Unspecified open wound, right foot, initial encounter: Secondary | ICD-10-CM

## 2020-10-20 DIAGNOSIS — L97411 Non-pressure chronic ulcer of right heel and midfoot limited to breakdown of skin: Secondary | ICD-10-CM | POA: Diagnosis present

## 2020-10-20 DIAGNOSIS — F419 Anxiety disorder, unspecified: Secondary | ICD-10-CM | POA: Diagnosis not present

## 2020-10-20 DIAGNOSIS — G894 Chronic pain syndrome: Secondary | ICD-10-CM | POA: Diagnosis present

## 2020-10-20 DIAGNOSIS — K219 Gastro-esophageal reflux disease without esophagitis: Secondary | ICD-10-CM | POA: Diagnosis present

## 2020-10-20 DIAGNOSIS — Z880 Allergy status to penicillin: Secondary | ICD-10-CM

## 2020-10-20 DIAGNOSIS — T8743 Infection of amputation stump, right lower extremity: Secondary | ICD-10-CM | POA: Diagnosis not present

## 2020-10-20 DIAGNOSIS — F32A Depression, unspecified: Secondary | ICD-10-CM | POA: Diagnosis present

## 2020-10-20 DIAGNOSIS — G4730 Sleep apnea, unspecified: Secondary | ICD-10-CM | POA: Diagnosis present

## 2020-10-20 DIAGNOSIS — Z881 Allergy status to other antibiotic agents status: Secondary | ICD-10-CM

## 2020-10-20 DIAGNOSIS — L03115 Cellulitis of right lower limb: Secondary | ICD-10-CM | POA: Diagnosis present

## 2020-10-20 DIAGNOSIS — L98499 Non-pressure chronic ulcer of skin of other sites with unspecified severity: Secondary | ICD-10-CM | POA: Diagnosis not present

## 2020-10-20 DIAGNOSIS — J4520 Mild intermittent asthma, uncomplicated: Secondary | ICD-10-CM | POA: Diagnosis not present

## 2020-10-20 DIAGNOSIS — J45909 Unspecified asthma, uncomplicated: Secondary | ICD-10-CM | POA: Diagnosis not present

## 2020-10-20 DIAGNOSIS — Z791 Long term (current) use of non-steroidal anti-inflammatories (NSAID): Secondary | ICD-10-CM

## 2020-10-20 DIAGNOSIS — Z28310 Unvaccinated for covid-19: Secondary | ICD-10-CM

## 2020-10-20 DIAGNOSIS — Z83438 Family history of other disorder of lipoprotein metabolism and other lipidemia: Secondary | ICD-10-CM | POA: Diagnosis not present

## 2020-10-20 DIAGNOSIS — M1990 Unspecified osteoarthritis, unspecified site: Secondary | ICD-10-CM | POA: Diagnosis present

## 2020-10-20 DIAGNOSIS — Z885 Allergy status to narcotic agent status: Secondary | ICD-10-CM

## 2020-10-20 DIAGNOSIS — L97519 Non-pressure chronic ulcer of other part of right foot with unspecified severity: Secondary | ICD-10-CM | POA: Diagnosis not present

## 2020-10-20 DIAGNOSIS — Z888 Allergy status to other drugs, medicaments and biological substances status: Secondary | ICD-10-CM

## 2020-10-20 DIAGNOSIS — Z8051 Family history of malignant neoplasm of kidney: Secondary | ICD-10-CM | POA: Diagnosis not present

## 2020-10-20 DIAGNOSIS — Z89431 Acquired absence of right foot: Secondary | ICD-10-CM

## 2020-10-20 DIAGNOSIS — Z833 Family history of diabetes mellitus: Secondary | ICD-10-CM | POA: Diagnosis not present

## 2020-10-20 DIAGNOSIS — Y835 Amputation of limb(s) as the cause of abnormal reaction of the patient, or of later complication, without mention of misadventure at the time of the procedure: Secondary | ICD-10-CM | POA: Diagnosis present

## 2020-10-20 DIAGNOSIS — Z743 Need for continuous supervision: Secondary | ICD-10-CM | POA: Diagnosis not present

## 2020-10-20 DIAGNOSIS — F319 Bipolar disorder, unspecified: Secondary | ICD-10-CM | POA: Diagnosis present

## 2020-10-20 DIAGNOSIS — Z8249 Family history of ischemic heart disease and other diseases of the circulatory system: Secondary | ICD-10-CM

## 2020-10-20 DIAGNOSIS — L97509 Non-pressure chronic ulcer of other part of unspecified foot with unspecified severity: Secondary | ICD-10-CM | POA: Diagnosis not present

## 2020-10-20 DIAGNOSIS — Z79899 Other long term (current) drug therapy: Secondary | ICD-10-CM | POA: Diagnosis not present

## 2020-10-20 DIAGNOSIS — Z6841 Body Mass Index (BMI) 40.0 and over, adult: Secondary | ICD-10-CM

## 2020-10-20 DIAGNOSIS — S61202A Unspecified open wound of right middle finger without damage to nail, initial encounter: Secondary | ICD-10-CM | POA: Diagnosis not present

## 2020-10-20 LAB — COMPREHENSIVE METABOLIC PANEL WITH GFR
ALT: 14 U/L (ref 0–44)
AST: 17 U/L (ref 15–41)
Albumin: 3.4 g/dL — ABNORMAL LOW (ref 3.5–5.0)
Alkaline Phosphatase: 77 U/L (ref 38–126)
Anion gap: 7 (ref 5–15)
BUN: 12 mg/dL (ref 6–20)
CO2: 24 mmol/L (ref 22–32)
Calcium: 8.8 mg/dL — ABNORMAL LOW (ref 8.9–10.3)
Chloride: 104 mmol/L (ref 98–111)
Creatinine, Ser: 0.66 mg/dL (ref 0.44–1.00)
GFR, Estimated: >60 mL/min
Glucose, Bld: 103 mg/dL — ABNORMAL HIGH (ref 70–99)
Potassium: 3.6 mmol/L (ref 3.5–5.1)
Sodium: 135 mmol/L (ref 135–145)
Total Bilirubin: 0.3 mg/dL (ref 0.3–1.2)
Total Protein: 6.7 g/dL (ref 6.5–8.1)

## 2020-10-20 LAB — CBC WITH DIFFERENTIAL/PLATELET
Abs Immature Granulocytes: 0.01 K/uL (ref 0.00–0.07)
Basophils Absolute: 0 K/uL (ref 0.0–0.1)
Basophils Relative: 0 %
Eosinophils Absolute: 0.1 K/uL (ref 0.0–0.5)
Eosinophils Relative: 1 %
HCT: 36.3 % (ref 36.0–46.0)
Hemoglobin: 12.3 g/dL (ref 12.0–15.0)
Immature Granulocytes: 0 %
Lymphocytes Relative: 31 %
Lymphs Abs: 2.3 K/uL (ref 0.7–4.0)
MCH: 32.4 pg (ref 26.0–34.0)
MCHC: 33.9 g/dL (ref 30.0–36.0)
MCV: 95.5 fL (ref 80.0–100.0)
Monocytes Absolute: 0.5 K/uL (ref 0.1–1.0)
Monocytes Relative: 7 %
Neutro Abs: 4.6 K/uL (ref 1.7–7.7)
Neutrophils Relative %: 61 %
Platelets: 243 K/uL (ref 150–400)
RBC: 3.8 MIL/uL — ABNORMAL LOW (ref 3.87–5.11)
RDW: 12.4 % (ref 11.5–15.5)
WBC: 7.5 K/uL (ref 4.0–10.5)
nRBC: 0 % (ref 0.0–0.2)

## 2020-10-20 LAB — LACTIC ACID, PLASMA: Lactic Acid, Venous: 1.8 mmol/L (ref 0.5–1.9)

## 2020-10-20 MED ORDER — VANCOMYCIN HCL 1500 MG/300ML IV SOLN
1500.0000 mg | Freq: Once | INTRAVENOUS | Status: AC
  Administered 2020-10-21: 1500 mg via INTRAVENOUS
  Filled 2020-10-20: qty 300

## 2020-10-20 MED ORDER — SODIUM CHLORIDE 0.9 % IV SOLN
2.0000 g | Freq: Once | INTRAVENOUS | Status: AC
  Administered 2020-10-20: 2 g via INTRAVENOUS
  Filled 2020-10-20: qty 2

## 2020-10-20 NOTE — ED Provider Notes (Signed)
[facility] EMERGENCY DEPARTMENT Provider Note   CSN: [id number] Arrival date & time: 10/20/20  1923     History Chief Complaint  Patient presents with   Foot Pain    [name] [name] is a 55 y.o. female.  Patient with a history of asthma, bipolar disorder, depression, GERD, hypertension here with right foot pain and ulceration.  States she has a chronic wound to her right foot that became more sore today after she was walking around.  States a large piece of skin sloughed off from the bottom of her foot and there was a bunch of blood in her shoe.  Denies any direct trauma.  Has purulent draining ulcer that is increased pain over the past several days.  She goes to the wound center and podiatry.  Denies any fevers, chills, nausea or vomiting.  Denies any chest pain or shortness of breath.  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  States she is not diabetic.  Also has a wound to her right middle finger after "biting the tip" 2 weeks ago.  Has redness and pain in this area as well.  No numbness or tingling.  Follows up with wound center for her foot ulcer.  States a large piece of skin sloughed off and there was a bunch of blood in her shoe but no purulent drainage.  Has increasing redness after walking today.  States also COVID positive about 4-5 days ago.  The history is provided by the patient.  Foot Pain Pertinent negatives include no chest pain, no abdominal pain, no headaches and no shortness of breath.      Past Medical History:  Diagnosis Date   Anxiety    Arthritis    Phreesia 02/08/2020   Asthma    mild intermittent   Asthma    Phreesia 02/08/2020   Bipolar 1 disorder ([place])    ect treatments last treatment Sep 02 1011   Depression    Depression    Phreesia 02/08/2020   Depression    Phreesia 07/10/2020   GERD (gastroesophageal reflux disease)    Hypertension    Pre-diabetes    Seizures ([place])    last seizure was so long  ago she can't remember.    Sleep apnea    wears CPAP, uncertain of setting   Substance abuse ([place])    Phreesia 02/08/2020    Patient Active Problem List   Diagnosis Date Noted   Syncope 08/26/2019   Non-healing open wound of toe 05/15/2019   Osteomyelitis ([place]) 05/15/2019   Palpitations 10/26/2018   Foot ulcer ([place]) 01/31/2018   Hyperglycemia 01/31/2018   Left leg cellulitis 09/24/2017   Syncope and collapse    Near syncope 11/23/2016   Abdominal pain 09/23/2016   Anxiety 09/23/2016   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[place]) 09/23/2016   Constipation 09/23/2016   DDD (degenerative disc disease), lumbosacral 09/23/2016   Migraines 09/23/2016   Myelopathy ([place]) 09/23/2016   Nausea 09/23/2016   Post laminectomy syndrome 09/23/2016   Pseudoarthrosis of lumbar spine 09/23/2016   Seizures ([place]) 09/23/2016   Shortness of breath 09/23/2016   Sleep apnea 09/23/2016   Spinal stenosis of lumbar region 09/23/2016   Vertigo 09/23/2016   Hallux malleus 11/15/2015   Chronic pain syndrome    Esophageal reflux    Depression    Rash 11/14/2015   Toe ulcer, right ([place]) 04/11/2015   Cellulitis of toe of right foot 04/11/2015   Nonspecific chest pain  Chest pain 10/26/2014   Obesity (BMI 30-39.9) 10/26/2014   Hypokalemia 10/26/2014   Manic bipolar I disorder ([place]) 10/26/2014   Atypical angina ([place]) 10/26/2014   Tardive dyskinesia 08/26/2011   Manic bipolar I disorder with rapid cycling ([place]) 05/18/2011    Class: Acute   Asthma 02/24/2011   Essential hypertension 02/24/2011   History of migraine headaches 02/24/2011    Past Surgical History:  Procedure Laterality Date   ABDOMINAL HYSTERECTOMY     AMPUTATION TOE Left 02/02/2018   Procedure: AMPUTATION TOE Left 4th toe;  Surgeon: [name], DPM;  Location: [place];  Service: Podiatry;  Laterality: Left;   APPENDECTOMY N/A    Phreesia 02/08/2020   BACK SURGERY     CARPAL TUNNEL RELEASE     x2   Laproscopic knee surgery      NECK SURGERY  03/15/2017   PLANTAR FASCIA RELEASE     x2   SPINE SURGERY N/A    Phreesia 02/08/2020   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   Procedure: TRANSMETATARSAL AMPUTATION;  Surgeon: [name], DPM;  Location: [facility];  Service: Podiatry;  Laterality: Right;     OB History     Gravida  3   Para  3   Term  3   Preterm      AB      Living  3      SAB      IAB      Ectopic      Multiple      Live Births  3           Family History  Problem Relation Age of Onset   Diabetes Mother    COPD Mother    Hypertension Mother    Hyperlipidemia Mother    Heart disease Father    Hyperlipidemia Father    Hypertension Father    Cancer Father    Heart disease Brother    Heart disease Daughter    Heart disease Maternal Grandmother    Hypertension Maternal Grandmother    Heart disease Maternal Grandfather    Kidney cancer Paternal Grandmother    Heart disease Paternal Grandfather     Social History   Tobacco Use   Smoking status: Never   Smokeless tobacco: Never  Vaping Use   Vaping Use: Never used  Substance Use Topics   Alcohol use: No   Drug use: No    Home Medications Prior to Admission medications   Medication Sig Start Date End Date Taking? Authorizing Provider  estradiol (ESTRACE) 1 MG tablet TAKE 1 TABLET AT BEDTIME 08/22/20   [name], MD  albuterol (PROAIR HFA) 108 (90 Base) MCG/ACT inhaler Inhale 2 puffs = 135mcg into the lungs every 6 (six) hours as needed for wheezing or shortness of breath. 07/11/20   Eulogio Bear, NP  busPIRone (BUSPAR) 15 MG tablet Take 15 mg by mouth 3 (three) times daily.  04/26/17   [provider]  Calcium Carbonate-Vit D-Min (CALCIUM 1200 PO) Take 1,200 mg by mouth daily.     [provider]  cyclobenzaprine (FLEXERIL) 10 MG tablet Take 10 mg by mouth 2 (two) times daily as needed for muscle spasms.     [provider]  DULoxetine (CYMBALTA) 60 MG capsule  TAKE 1 CAPSULE BY MOUTH EVERY DAY 01/16/20   [name], MD  Erenumab-aooe (AIMOVIG) 70 MG/ML SOAJ Inject 70 mg into the skin every 30 (thirty) days. 02/29/20   Pickard,  Cammie Mcgee, MD  escitalopram (LEXAPRO) 10 MG tablet Take 10 mg by mouth daily. 08/23/19   [provider]  fluticasone (FLONASE) 50 MCG/ACT nasal spray SPRAY 2 SPRAYS INTO EACH NOSTRIL EVERY DAY 09/18/19   [name], MD  haloperidol (HALDOL) 5 MG tablet Take 1 tablet (5 mg total) by mouth at bedtime. For psychosis 11/15/15   Barton Dubois, MD  Ivermectin 0.5 % LOTN Apply 1 application topically once as needed for up to 1 dose. 10/07/20   [name], MD  levocetirizine (XYZAL) 5 MG tablet TAKE 1 TABLET BY MOUTH EVERY DAY IN THE EVENING Patient not taking: Reported on 08/21/2020 02/28/20   [name], MD  LINZESS 290 MCG CAPS capsule TAKE 1 CAPSULE BY MOUTH EVERY DAY IN THE MORNING BEFORE BREAKFAST 02/19/20   [name], MD  meloxicam (MOBIC) 15 MG tablet Take 15 mg by mouth at bedtime.  09/15/17   [provider]  metoprolol succinate (TOPROL-XL) 25 MG 24 hr tablet TAKE 1 TABLET(25 MG) BY MOUTH DAILY 09/16/20   [name], MD  Multiple Vitamins-Minerals (MULTIVITAMIN WITH MINERALS) tablet Take 1 tablet by mouth daily.    [provider]  NARCAN 4 MG/0.1ML LIQD nasal spray kit Place 1 spray into the nose once.  Patient not taking: Reported on 08/21/2020 12/19/18   [provider]  nystatin (MYCOSTATIN/NYSTOP) powder APPLY TO AFFECTED AREA 4 TIMES A DAY 06/25/19   [name], MD  OVER THE COUNTER MEDICATION Fennel Seed    [provider]  oxyCODONE-acetaminophen (PERCOCET) 7.5-325 MG tablet Take 1 tablet by mouth 5 (five) times daily as needed for severe pain.    [provider]  pantoprazole (PROTONIX) 40 MG tablet TAKE 1 TABLET BY MOUTH TWICE A DAY 04/30/20   [name], MD  potassium chloride SA (KLOR-CON) 20 MEQ tablet TAKE 2 TABLET BY MOUTH  EVERY DAY 06/09/20   [name], MD  pregabalin (LYRICA) 100 MG capsule Take 100 mg by mouth 2 (two) times daily. 06/26/20   [provider]  SUMAtriptan (IMITREX) 50 MG tablet TAKE 1 TAB EVERY 2 HOURS AS NEEDED FOR MIGRAINE. MAY REPEAT IN 2 HOURS IF PERSISTS OR RECURS 08/30/19   [name], MD  topiramate (TOPAMAX) 50 MG tablet Take 1 tablet (50 mg total) by mouth 2 (two) times daily. 03/25/20   [name], MD  traZODone (DESYREL) 100 MG tablet Take 100 mg by mouth at bedtime. 07/29/14   [provider]    Allergies    Other, Penicillins, Tetracyclines & related, Tramadol, Phenazopyridine, Tetracycline, Ciprofloxacin, Codeine, and Estradiol  Review of Systems   Review of Systems  Constitutional:  Positive for fatigue. Negative for activity change, appetite change and fever.  HENT:  Negative for congestion and rhinorrhea.   Respiratory:  Negative for cough, chest tightness and shortness of breath.   Cardiovascular:  Negative for chest pain.  Gastrointestinal:  Negative for abdominal pain, nausea and vomiting.  Genitourinary:  Negative for dysuria and hematuria.  Skin:  Positive for rash and wound.  Neurological:  Negative for weakness and headaches.   all other systems are negative except as noted in the HPI and PMH.   Physical Exam Updated Vital Signs BP 137/78   Pulse 88   Temp 98.7 F (37.1 C)   Resp 20   Ht $R'5\' 4"'Sk$  (1.626 m)   Wt 108 kg   SpO2 98%   BMI 40.85 kg/m  Physical Exam Vitals and nursing note reviewed.  Constitutional:      General: She is not in acute distress.    Appearance: She is well-developed.  HENT:     Head: Normocephalic and atraumatic.     Mouth/Throat:     Pharynx: No oropharyngeal exudate.  Eyes:     Conjunctiva/sclera: Conjunctivae normal.     Pupils: Pupils are equal, round, and reactive to light.  Neck:     Comments: No meningismus. Cardiovascular:     Rate and Rhythm: Normal rate and regular rhythm.      Heart sounds: Normal heart sounds. No murmur heard. Pulmonary:     Effort: Pulmonary effort is normal. No respiratory distress.     Breath sounds: Normal breath sounds.  Abdominal:     Palpations: Abdomen is soft.     Tenderness: There is no abdominal tenderness. There is no guarding or rebound.  Musculoskeletal:        General: Swelling, tenderness and signs of injury present. Normal range of motion.     Cervical back: Normal range of motion and neck supple.     Comments: Previous transmetatarsal amputation on the right.  Surrounding erythema.  There is a large flap of devitalized skin on the plantar surface of her foot.  0.5 cm purulent ulcer on the plantar surface. Intact DP and PT pulses.  Eschar and erythema to the right middle fingertip as depicted.  Reduced range of motion of DIP joint  Skin:    General: Skin is warm.  Neurological:     Mental Status: She is alert and oriented to person, place, and time.     Cranial Nerves: No cranial nerve deficit.     Motor: No abnormal muscle tone.     Coordination: Coordination normal.     Comments:  5/5 strength throughout. CN 2-12 intact.Equal grip strength.   Psychiatric:        Behavior: Behavior normal.         ED Results / Procedures / Treatments   Labs (all labs ordered are listed, but only abnormal results are displayed) Labs Reviewed  RESP PANEL BY RT-PCR (FLU A&B, COVID) ARPGX2 - Abnormal; Notable for the following components:      Result Value   SARS Coronavirus 2 by RT PCR POSITIVE (*)    All other components within normal limits  COMPREHENSIVE METABOLIC PANEL - Abnormal; Notable for the following components:   Glucose, Bld 103 (*)    Calcium 8.8 (*)    Albumin 3.4 (*)    All other components within normal limits  CBC WITH DIFFERENTIAL/PLATELET - Abnormal; Notable for the following components:   RBC 3.80 (*)    All other components within normal limits  SEDIMENTATION RATE - Abnormal; Notable for the following  components:   Sed Rate 35 (*)    All other components within normal limits  C-REACTIVE PROTEIN - Abnormal; Notable for the following components:   CRP 2.5 (*)    All other components within normal limits  CULTURE, BLOOD (ROUTINE X 2)  CULTURE, BLOOD (ROUTINE X 2)  LACTIC ACID, PLASMA  LACTIC ACID, PLASMA    EKG None  Radiology DG Foot Complete Right  Result Date: 10/20/2020 CLINICAL DATA:  Foot ulcer with concern for infection. EXAM: RIGHT FOOT COMPLETE - 3+ VIEW COMPARISON:  Most recent radiograph 05/18/2019 FINDINGS: Five ray transmetatarsal amputation. Resection margins are smooth. There is no erosion, bone destruction or periosteal reaction. Mild soft tissue prominence about the distal  stump with questionable ulcer overlying the region of the second metatarsal. No tracking soft tissue air. No radiopaque foreign body. No fracture. Achilles tendon enthesophyte and plantar fascial calcifications again seen. IMPRESSION: 1. Previous transmetatarsal amputation without radiographic findings of osteomyelitis. 2. Soft tissue prominence about the distal stump with questionable ulcer overlying the region of the second metatarsal. Electronically Signed   By: Keith Rake M.D.   On: 10/20/2020 22:25    Procedures Procedures   Medications Ordered in ED Medications  ceFEPIme (MAXIPIME) 2 g in sodium chloride 0.9 % 100 mL IVPB (has no administration in time range)    ED Course  I have reviewed the triage vital signs and the nursing notes.  Pertinent labs & imaging results that were available during my care of the patient were reviewed by me and considered in my medical decision making (see chart for details).    MDM Rules/Calculators/A&P                         Foot wound with chronic ulcer as well as wound to hand.  Vital stable, no distress.  No fever.  Distal pulses intact.  X-ray shows no gas or evidence of osteomyelitis  Broad-spectrum antibiotics started.  Concern for foot  cellulitis with infected ulcer. Patient also COVID-positive with no evidence of respiratory distress, hypoxia or severe sepsis.  Broad-spectrum antibiotics to treat soft tissue infection of right hand as well as right foot.  Cultures obtained for antibiotics begun.  Admission discussed with Dr. Josephine Cables.   [name] [name] was evaluated in Emergency Department on 10/21/2020 for the symptoms described in the history of present illness. She was evaluated in the context of the global COVID-19 pandemic, which necessitated consideration that the patient might be at risk for infection with the SARS-CoV-2 virus that causes COVID-19. Institutional protocols and algorithms that pertain to the evaluation of patients at risk for COVID-19 are in a state of rapid change based on information released by regulatory bodies including the CDC and federal and state organizations. These policies and algorithms were followed during the patient's care in the ED.  Final Clinical Impression(s) / ED Diagnoses Final diagnoses:  Ulcer of right foot, unspecified ulcer stage [facility])    Rx / DC Orders ED Discharge Orders     None        Harmon Bommarito, Annie Main, MD 10/21/20 [phone]

## 2020-10-20 NOTE — ED Provider Notes (Signed)
Emergency Medicine Provider Triage Evaluation Note  [name] [name] , a 55 y.o. female  was evaluated in triage.  Pt complains of right foot infection.  Has history of ulcers, including ulcer to the right foot.  She noticed today bleeding, expanding erythema, severe pain with movement.  Also worried about infection to the middle finger of the right hand which has been there for 2 weeks.  Review of Systems  Positive: Ulcer, finger pain, foot pain Negative: Fevers, chills  Physical Exam  BP 125/74   Pulse 100   Temp 98.7 F (37.1 C)   Resp 20   Ht 5\' 4"  (1.626 m)   Wt 108 kg   SpO2 98%   BMI 40.85 kg/m  Gen:   Awake, no distress   Resp:  Normal effort  MSK:   Moves extremities without difficulty  Other:  Patient has sensation to the right foot, but there is surrounding erythema and warmth.  Obvious ulcer with skin flap.  DP and PT are palpable.  Medical Decision Making  Medically screening exam initiated at 9:26 PM.  Appropriate orders placed.  LORYN HAACKE was informed that the remainder of the evaluation will be completed by another provider, this initial triage assessment does not replace that evaluation, and the importance of remaining in the ED until their evaluation is complete.  Suspect patient may need admission for IV antibiotics.  Will order x-ray to assess for gas and get basic labs.   Sherrill Raring, PA-C 10/20/20 [id number]    Milton Ferguson, MD 10/21/20 [phone]

## 2020-10-20 NOTE — ED Triage Notes (Signed)
BIB [facility] with an ulcer to R foot. Started out as size of finger tip but states it's progressed and is now hot to touch.

## 2020-10-21 DIAGNOSIS — L98499 Non-pressure chronic ulcer of skin of other sites with unspecified severity: Secondary | ICD-10-CM

## 2020-10-21 DIAGNOSIS — J4520 Mild intermittent asthma, uncomplicated: Secondary | ICD-10-CM

## 2020-10-21 DIAGNOSIS — Z833 Family history of diabetes mellitus: Secondary | ICD-10-CM | POA: Diagnosis not present

## 2020-10-21 DIAGNOSIS — E11621 Type 2 diabetes mellitus with foot ulcer: Secondary | ICD-10-CM | POA: Diagnosis not present

## 2020-10-21 DIAGNOSIS — Z825 Family history of asthma and other chronic lower respiratory diseases: Secondary | ICD-10-CM | POA: Diagnosis not present

## 2020-10-21 DIAGNOSIS — M869 Osteomyelitis, unspecified: Secondary | ICD-10-CM | POA: Diagnosis not present

## 2020-10-21 DIAGNOSIS — S91301A Unspecified open wound, right foot, initial encounter: Secondary | ICD-10-CM | POA: Diagnosis present

## 2020-10-21 DIAGNOSIS — L02611 Cutaneous abscess of right foot: Secondary | ICD-10-CM | POA: Diagnosis not present

## 2020-10-21 DIAGNOSIS — Y835 Amputation of limb(s) as the cause of abnormal reaction of the patient, or of later complication, without mention of misadventure at the time of the procedure: Secondary | ICD-10-CM | POA: Diagnosis present

## 2020-10-21 DIAGNOSIS — L089 Local infection of the skin and subcutaneous tissue, unspecified: Secondary | ICD-10-CM | POA: Diagnosis not present

## 2020-10-21 DIAGNOSIS — L97521 Non-pressure chronic ulcer of other part of left foot limited to breakdown of skin: Secondary | ICD-10-CM | POA: Diagnosis not present

## 2020-10-21 DIAGNOSIS — J45909 Unspecified asthma, uncomplicated: Secondary | ICD-10-CM | POA: Diagnosis not present

## 2020-10-21 DIAGNOSIS — M7989 Other specified soft tissue disorders: Secondary | ICD-10-CM | POA: Diagnosis not present

## 2020-10-21 DIAGNOSIS — L97411 Non-pressure chronic ulcer of right heel and midfoot limited to breakdown of skin: Secondary | ICD-10-CM | POA: Diagnosis not present

## 2020-10-21 DIAGNOSIS — T8743 Infection of amputation stump, right lower extremity: Secondary | ICD-10-CM | POA: Diagnosis not present

## 2020-10-21 DIAGNOSIS — I96 Gangrene, not elsewhere classified: Secondary | ICD-10-CM | POA: Diagnosis not present

## 2020-10-21 DIAGNOSIS — G894 Chronic pain syndrome: Secondary | ICD-10-CM | POA: Diagnosis present

## 2020-10-21 DIAGNOSIS — Z8051 Family history of malignant neoplasm of kidney: Secondary | ICD-10-CM | POA: Diagnosis not present

## 2020-10-21 DIAGNOSIS — Z791 Long term (current) use of non-steroidal anti-inflammatories (NSAID): Secondary | ICD-10-CM | POA: Diagnosis not present

## 2020-10-21 DIAGNOSIS — Z880 Allergy status to penicillin: Secondary | ICD-10-CM | POA: Diagnosis not present

## 2020-10-21 DIAGNOSIS — L97511 Non-pressure chronic ulcer of other part of right foot limited to breakdown of skin: Secondary | ICD-10-CM | POA: Diagnosis not present

## 2020-10-21 DIAGNOSIS — U071 COVID-19: Secondary | ICD-10-CM | POA: Diagnosis not present

## 2020-10-21 DIAGNOSIS — Z881 Allergy status to other antibiotic agents status: Secondary | ICD-10-CM | POA: Diagnosis not present

## 2020-10-21 DIAGNOSIS — M961 Postlaminectomy syndrome, not elsewhere classified: Secondary | ICD-10-CM | POA: Diagnosis not present

## 2020-10-21 DIAGNOSIS — M1990 Unspecified osteoarthritis, unspecified site: Secondary | ICD-10-CM | POA: Diagnosis not present

## 2020-10-21 DIAGNOSIS — Z8249 Family history of ischemic heart disease and other diseases of the circulatory system: Secondary | ICD-10-CM | POA: Diagnosis not present

## 2020-10-21 DIAGNOSIS — Z79899 Other long term (current) drug therapy: Secondary | ICD-10-CM | POA: Diagnosis not present

## 2020-10-21 DIAGNOSIS — K219 Gastro-esophageal reflux disease without esophagitis: Secondary | ICD-10-CM | POA: Diagnosis not present

## 2020-10-21 DIAGNOSIS — G4733 Obstructive sleep apnea (adult) (pediatric): Secondary | ICD-10-CM | POA: Diagnosis not present

## 2020-10-21 DIAGNOSIS — L03115 Cellulitis of right lower limb: Secondary | ICD-10-CM | POA: Diagnosis not present

## 2020-10-21 DIAGNOSIS — F419 Anxiety disorder, unspecified: Secondary | ICD-10-CM | POA: Diagnosis not present

## 2020-10-21 DIAGNOSIS — Z885 Allergy status to narcotic agent status: Secondary | ICD-10-CM | POA: Diagnosis not present

## 2020-10-21 DIAGNOSIS — I10 Essential (primary) hypertension: Secondary | ICD-10-CM

## 2020-10-21 DIAGNOSIS — Z6841 Body Mass Index (BMI) 40.0 and over, adult: Secondary | ICD-10-CM | POA: Diagnosis not present

## 2020-10-21 DIAGNOSIS — F319 Bipolar disorder, unspecified: Secondary | ICD-10-CM | POA: Diagnosis present

## 2020-10-21 DIAGNOSIS — G4730 Sleep apnea, unspecified: Secondary | ICD-10-CM | POA: Diagnosis present

## 2020-10-21 DIAGNOSIS — Z83438 Family history of other disorder of lipoprotein metabolism and other lipidemia: Secondary | ICD-10-CM | POA: Diagnosis not present

## 2020-10-21 DIAGNOSIS — R531 Weakness: Secondary | ICD-10-CM | POA: Diagnosis not present

## 2020-10-21 DIAGNOSIS — L97519 Non-pressure chronic ulcer of other part of right foot with unspecified severity: Secondary | ICD-10-CM | POA: Diagnosis not present

## 2020-10-21 LAB — CBC
HCT: 32.7 % — ABNORMAL LOW (ref 36.0–46.0)
Hemoglobin: 11.1 g/dL — ABNORMAL LOW (ref 12.0–15.0)
MCH: 32.5 pg (ref 26.0–34.0)
MCHC: 33.9 g/dL (ref 30.0–36.0)
MCV: 95.6 fL (ref 80.0–100.0)
Platelets: 226 10*3/uL (ref 150–400)
RBC: 3.42 MIL/uL — ABNORMAL LOW (ref 3.87–5.11)
RDW: 12.5 % (ref 11.5–15.5)
WBC: 7.5 10*3/uL (ref 4.0–10.5)
nRBC: 0 % (ref 0.0–0.2)

## 2020-10-21 LAB — COMPREHENSIVE METABOLIC PANEL
ALT: 13 U/L (ref 0–44)
AST: 15 U/L (ref 15–41)
Albumin: 3.1 g/dL — ABNORMAL LOW (ref 3.5–5.0)
Alkaline Phosphatase: 66 U/L (ref 38–126)
Anion gap: 9 (ref 5–15)
BUN: 13 mg/dL (ref 6–20)
CO2: 22 mmol/L (ref 22–32)
Calcium: 8.4 mg/dL — ABNORMAL LOW (ref 8.9–10.3)
Chloride: 104 mmol/L (ref 98–111)
Creatinine, Ser: 0.59 mg/dL (ref 0.44–1.00)
GFR, Estimated: >60 mL/min (ref >60)
Glucose, Bld: 104 mg/dL — ABNORMAL HIGH (ref 70–99)
Potassium: 3.7 mmol/L (ref 3.5–5.1)
Sodium: 135 mmol/L (ref 135–145)
Total Bilirubin: 0.4 mg/dL (ref 0.3–1.2)
Total Protein: 6.2 g/dL — ABNORMAL LOW (ref 6.5–8.1)

## 2020-10-21 LAB — RESP PANEL BY RT-PCR (FLU A&B, COVID) ARPGX2
Influenza A by PCR: NEGATIVE
Influenza B by PCR: NEGATIVE
SARS Coronavirus 2 by RT PCR: POSITIVE — AB

## 2020-10-21 LAB — MAGNESIUM: Magnesium: 1.8 mg/dL (ref 1.7–2.4)

## 2020-10-21 LAB — APTT: aPTT: 29 seconds (ref 24–36)

## 2020-10-21 LAB — PHOSPHORUS: Phosphorus: 3.6 mg/dL (ref 2.5–4.6)

## 2020-10-21 LAB — LACTIC ACID, PLASMA: Lactic Acid, Venous: 1.9 mmol/L (ref 0.5–1.9)

## 2020-10-21 LAB — SEDIMENTATION RATE: Sed Rate: 35 mm/hr — ABNORMAL HIGH (ref 0–22)

## 2020-10-21 LAB — PROTIME-INR
INR: 1 (ref 0.8–1.2)
Prothrombin Time: 13.5 seconds (ref 11.4–15.2)

## 2020-10-21 LAB — C-REACTIVE PROTEIN: CRP: 2.5 mg/dL — ABNORMAL HIGH (ref <1.0)

## 2020-10-21 LAB — HIV ANTIBODY (ROUTINE TESTING W REFLEX): HIV Screen 4th Generation wRfx: NONREACTIVE

## 2020-10-21 MED ORDER — OXYCODONE HCL 5 MG PO TABS
5.0000 mg | ORAL_TABLET | ORAL | Status: DC | PRN
  Administered 2020-10-21 – 2020-10-26 (×23): 5 mg via ORAL
  Filled 2020-10-21 (×24): qty 1

## 2020-10-21 MED ORDER — SODIUM CHLORIDE 0.9 % IV SOLN
100.0000 mg | Freq: Every day | INTRAVENOUS | Status: DC
  Administered 2020-10-21 – 2020-10-23 (×3): 100 mg via INTRAVENOUS
  Filled 2020-10-21: qty 100
  Filled 2020-10-21 (×4): qty 20

## 2020-10-21 MED ORDER — MALATHION 0.5 % EX LOTN: TOPICAL_LOTION | Freq: Once | CUTANEOUS | 0 refills | Status: DC

## 2020-10-21 MED ORDER — METOPROLOL SUCCINATE ER 25 MG PO TB24
25.0000 mg | ORAL_TABLET | Freq: Every day | ORAL | Status: DC
  Administered 2020-10-21 – 2020-10-26 (×6): 25 mg via ORAL
  Filled 2020-10-21 (×5): qty 1

## 2020-10-21 MED ORDER — SODIUM CHLORIDE 0.9 % IV SOLN
100.0000 mg | INTRAVENOUS | Status: AC
  Administered 2020-10-21: 100 mg via INTRAVENOUS
  Filled 2020-10-21 (×2): qty 20

## 2020-10-21 MED ORDER — DULOXETINE HCL 60 MG PO CPEP
60.0000 mg | ORAL_CAPSULE | Freq: Every day | ORAL | Status: DC
  Administered 2020-10-21 – 2020-10-26 (×6): 60 mg via ORAL
  Filled 2020-10-21 (×2): qty 1
  Filled 2020-10-21: qty 2
  Filled 2020-10-21 (×3): qty 1

## 2020-10-21 MED ORDER — PANTOPRAZOLE SODIUM 40 MG PO TBEC
40.0000 mg | DELAYED_RELEASE_TABLET | Freq: Two times a day (BID) | ORAL | Status: DC
  Administered 2020-10-21 – 2020-10-26 (×11): 40 mg via ORAL
  Filled 2020-10-21 (×11): qty 1

## 2020-10-21 MED ORDER — FENTANYL CITRATE (PF) 100 MCG/2ML IJ SOLN
50.0000 ug | Freq: Once | INTRAMUSCULAR | Status: AC
  Administered 2020-10-21: 50 ug via INTRAVENOUS
  Filled 2020-10-21: qty 2

## 2020-10-21 MED ORDER — VANCOMYCIN HCL 1250 MG/250ML IV SOLN
1250.0000 mg | Freq: Two times a day (BID) | INTRAVENOUS | Status: DC
  Administered 2020-10-21 – 2020-10-26 (×11): 1250 mg via INTRAVENOUS
  Filled 2020-10-21 (×12): qty 250

## 2020-10-21 MED ORDER — BUSPIRONE HCL 15 MG PO TABS
15.0000 mg | ORAL_TABLET | Freq: Three times a day (TID) | ORAL | Status: DC
  Administered 2020-10-21 – 2020-10-26 (×14): 15 mg via ORAL
  Filled 2020-10-21: qty 3
  Filled 2020-10-21: qty 1
  Filled 2020-10-21: qty 3
  Filled 2020-10-21 (×7): qty 1
  Filled 2020-10-21: qty 3
  Filled 2020-10-21: qty 1
  Filled 2020-10-21 (×2): qty 3
  Filled 2020-10-21: qty 1

## 2020-10-21 MED ORDER — PREGABALIN 50 MG PO CAPS
100.0000 mg | ORAL_CAPSULE | Freq: Two times a day (BID) | ORAL | Status: DC
  Administered 2020-10-21 – 2020-10-22 (×2): 100 mg via ORAL
  Filled 2020-10-21 (×2): qty 2

## 2020-10-21 MED ORDER — ALBUTEROL SULFATE HFA 108 (90 BASE) MCG/ACT IN AERS
1.0000 | INHALATION_SPRAY | RESPIRATORY_TRACT | Status: DC | PRN
  Filled 2020-10-21: qty 6.7

## 2020-10-21 MED ORDER — ACETAMINOPHEN 325 MG PO TABS
650.0000 mg | ORAL_TABLET | Freq: Four times a day (QID) | ORAL | Status: DC | PRN
  Administered 2020-10-21: 650 mg via ORAL
  Filled 2020-10-21: qty 2

## 2020-10-21 NOTE — Addendum Note (Signed)
Addended by: Sheral Flow on: 10/21/2020 02:02 PM   Modules accepted: Orders

## 2020-10-21 NOTE — Progress Notes (Signed)
Patient seen and evaluated, chart reviewed, please see EMR for updated orders. Please see full H&P dictated by admitting physician Dr Josephine Cables for same date of service.    Brief Summary:-  55 y.o. female with medical history significant for GERD, hypertension, depression, bipolar admitted on 10/21/2020 with right foot transmetatarsal amputation stump infection    A/p  1)Rt foot transmetatarsal amputation stump infection--- suspect abscess, general surgery consult from Dr. Arnoldo Morale requested -WBC 7.5, CRP is 2.5, ESR is 35 and lactic acid is not elevated -Hopefully I&D can be done in OR cultures can be sent to guide antibiotic choice -Continue IV vancomycin monotherapy for now -Received 1 dose of cefepime in the ED  -Foot x-rays without osteo-, may need MRI--- await input from general surgeon  2) depression/anxiety--- continue Cymbalta and buspirone  3)Morbid Obesity- -Low calorie diet, portion control and increase physical activity discussed with patient -Body mass index is 40.85 kg/m.  4)Covid +ve--- patient is unvaccinated and largely asymptomatic from a COVID standpoint -Prophylactic remdesivir -Does not meet criteria for steroids -No hypoxia  5)HTN--continue metoprolol May use IV hydralazine as needed elevated BP  6) chronic pain syndrome--continue Cymbalta and Lyrica  7)GERD--Protonix  Total care time 46 minutes  Patient seen and evaluated, chart reviewed, please see EMR for updated orders. Please see full H&P dictated by admitting physician Dr Josephine Cables for same date of service.    Media Information         Document Information  Photos  Rt foot stump   10/21/2020 15:33  Attached To:  Hospital Encounter on 10/20/20   Source Information  Roxan Hockey, MD  [facility]     Media Information         Document Information  Photos  Rt foot stump   10/21/2020 15:34  Attached To:  Hospital Encounter on 10/20/20   Source  Information  Roxan Hockey, MD  [facility]

## 2020-10-21 NOTE — H&P (Signed)
History and Physical  [name] [name] XHB:[id number] DOB: 1966/04/10 DOA: 10/20/2020  Referring physician: Ezequiel Essex, MD PCP: [name], MD  Patient coming from: Home  Chief Complaint: Foot pain  HPI: [name] [name] is a 55 y.o. female with medical history significant for GERD, hypertension, depression, bipolar who presents to the emergency department due to right foot pain and ulceration.  Patient has right transmetatarsal amputated foot and she is complaining of a chronic wound to her right foot which presented with worsening pain while walking today.  She noted sloughing of of a large piece of skin from the bottom of her foot and a pool of blood in her shoes.  She reported increased pain over the past several days due to purulent draining ulcer.  Patient follows with a podiatrist and wound clinic, she denies fever, chills, chest pain, shortness of breath, nausea or vomiting.  Patient also complaining of 2-week onset of right middle finger wound which occurred due to biting the finger.  Patient was diagnosed with COVID about 4 to 5 days ago.  She ambulates with a cane at baseline.  ED Course: In the emergency department, she was hemodynamically stable.  Work-up in the ED showed normal CBC and BMP, lactic acid was 1.9, CRP 2.5, ESR 35.  Influenza A, B was negative.  SARS coronavirus 2 was positive. Right middle finger x-ray showed Soft tissue irregularity of the distal digit. No radiopaque foreign body or acute osseous abnormality. Osteoarthritis. Right foot x-ray showed Previous transmetatarsal amputation without radiographic findings of osteomyelitis. Soft tissue prominence about the distal stump with questionable ulcer overlying the region of the second metatarsal . Patient was treated with IV cefepime and vancomycin.  IV fentanyl was given.  Hospitalist was asked to admit patient for further evaluation and management.   Review of Systems: Constitutional: Negative for chills  and fever.  HENT: Negative for ear pain and sore throat.   Eyes: Negative for pain and visual disturbance.  Respiratory: Negative for cough, chest tightness and shortness of breath.   Cardiovascular: Negative for chest pain and palpitations.  Gastrointestinal: Negative for abdominal pain and vomiting.  Endocrine: Negative for polyphagia and polyuria.  Genitourinary: Negative for decreased urine volume, dysuria, enuresis Musculoskeletal: Positive for right foot and right third middle finger wounds.  Negative for arthralgias and back pain.  Skin:negative for color change and rash.  Allergic/Immunologic: Negative for immunocompromised state.  Neurological: Negative for tremors, syncope, speech difficulty, weakness, light-headedness and headaches.  Hematological: Does not bruise/bleed easily.  All other systems reviewed and are negative   Past Medical History:  Diagnosis Date   Anxiety    Arthritis    Phreesia 02/08/2020   Asthma    mild intermittent   Asthma    Phreesia 02/08/2020   Bipolar 1 disorder ([place])    ect treatments last treatment Sep 02 1011   Depression    Depression    Phreesia 02/08/2020   Depression    Phreesia 07/10/2020   GERD (gastroesophageal reflux disease)    Hypertension    Pre-diabetes    Seizures ([place])    last seizure was so long ago she can't remember.    Sleep apnea    wears CPAP, uncertain of setting   Substance abuse ([place])    Phreesia 02/08/2020   Past Surgical History:  Procedure Laterality Date   ABDOMINAL HYSTERECTOMY     AMPUTATION TOE Left 02/02/2018   Procedure: AMPUTATION TOE Left 4th toe;  Surgeon: [name],  DPM;  Location: [facility];  Service: Podiatry;  Laterality: Left;   APPENDECTOMY N/A    Phreesia 02/08/2020   BACK SURGERY     CARPAL TUNNEL RELEASE     x2   Laproscopic knee surgery     NECK SURGERY  03/15/2017   PLANTAR FASCIA RELEASE     x2   SPINE SURGERY N/A    Phreesia 02/08/2020   TRANSMETATARSAL AMPUTATION  Right 05/18/2019   Procedure: TRANSMETATARSAL AMPUTATION;  Surgeon: [name], DPM;  Location: [facility];  Service: Podiatry;  Laterality: Right;    Social History:  reports that she has never smoked. She has never used smokeless tobacco. She reports that she does not drink alcohol and does not use drugs.   Allergies  Allergen Reactions   Other    Penicillins Hives and Rash    No anaphylactic or severe cutaneous Sx > 10 yr ago, no additional medical attention required Tolerates Keflex and Rocephin   Tetracyclines & Related Other (See Comments)    Syncope and put her "in a coma"   Tramadol Other (See Comments)    Seizures   Phenazopyridine Other (See Comments)    Unknown   Tetracycline Other (See Comments)    Syncope and "Put me in a coma"   Ciprofloxacin Rash and Itching   Codeine Itching and Rash   Estradiol Rash    Patches broke out the skin    Family History  Problem Relation Age of Onset   Diabetes Mother    COPD Mother    Hypertension Mother    Hyperlipidemia Mother    Heart disease Father    Hyperlipidemia Father    Hypertension Father    Cancer Father    Heart disease Brother    Heart disease Daughter    Heart disease Maternal Grandmother    Hypertension Maternal Grandmother    Heart disease Maternal Grandfather    Kidney cancer Paternal Grandmother    Heart disease Paternal Grandfather      Prior to Admission medications   Medication Sig Start Date End Date Taking? Authorizing Provider  estradiol (ESTRACE) 1 MG tablet TAKE 1 TABLET AT BEDTIME 08/22/20   [name], MD  albuterol (PROAIR HFA) 108 (90 Base) MCG/ACT inhaler Inhale 2 puffs = 185mg into the lungs every 6 (six) hours as needed for wheezing or shortness of breath. 07/11/20   MEulogio Bear NP  busPIRone (BUSPAR) 15 MG tablet Take 15 mg by mouth 3 (three) times daily.  04/26/17   [provider]  Calcium Carbonate-Vit D-Min (CALCIUM 1200 PO) Take 1,200 mg by mouth daily.      [provider]  cyclobenzaprine (FLEXERIL) 10 MG tablet Take 10 mg by mouth 2 (two) times daily as needed for muscle spasms.     [provider]  DULoxetine (CYMBALTA) 60 MG capsule TAKE 1 CAPSULE BY MOUTH EVERY DAY 01/16/20   PSusy Frizzle MD  Erenumab-aooe (AIMOVIG) 70 MG/ML SOAJ Inject 70 mg into the skin every 30 (thirty) days. 02/29/20   PSusy Frizzle MD  escitalopram (LEXAPRO) 10 MG tablet Take 10 mg by mouth daily. 08/23/19   [provider]  fluticasone (FLONASE) 50 MCG/ACT nasal spray SPRAY 2 SPRAYS INTO EACH NOSTRIL EVERY DAY 09/18/19   PSusy Frizzle MD  haloperidol (HALDOL) 5 MG tablet Take 1 tablet (5 mg total) by mouth at bedtime. For psychosis 11/15/15   MBarton Dubois MD  Ivermectin 0.5 % LOTN Apply 1 application  topically once as needed for up to 1 dose. 10/07/20   [name], MD  levocetirizine (XYZAL) 5 MG tablet TAKE 1 TABLET BY MOUTH EVERY DAY IN THE EVENING Patient not taking: Reported on 08/21/2020 02/28/20   [name], MD  LINZESS 290 MCG CAPS capsule TAKE 1 CAPSULE BY MOUTH EVERY DAY IN THE MORNING BEFORE BREAKFAST 02/19/20   [name], MD  meloxicam (MOBIC) 15 MG tablet Take 15 mg by mouth at bedtime.  09/15/17   [provider]  metoprolol succinate (TOPROL-XL) 25 MG 24 hr tablet TAKE 1 TABLET(25 MG) BY MOUTH DAILY 09/16/20   [name], MD  Multiple Vitamins-Minerals (MULTIVITAMIN WITH MINERALS) tablet Take 1 tablet by mouth daily.    [provider]  NARCAN 4 MG/0.1ML LIQD nasal spray kit Place 1 spray into the nose once.  Patient not taking: Reported on 08/21/2020 12/19/18   [provider]  nystatin (MYCOSTATIN/NYSTOP) powder APPLY TO AFFECTED AREA 4 TIMES A DAY 06/25/19   [name], MD  OVER THE COUNTER MEDICATION Fennel Seed    [provider]  oxyCODONE-acetaminophen (PERCOCET) 7.5-325 MG tablet Take 1 tablet by mouth 5 (five) times daily as needed for severe pain.     [provider]  pantoprazole (PROTONIX) 40 MG tablet TAKE 1 TABLET BY MOUTH TWICE A DAY 04/30/20   [name], MD  potassium chloride SA (KLOR-CON) 20 MEQ tablet TAKE 2 TABLET BY MOUTH EVERY DAY 06/09/20   [name], MD  pregabalin (LYRICA) 100 MG capsule Take 100 mg by mouth 2 (two) times daily. 06/26/20   [provider]  SUMAtriptan (IMITREX) 50 MG tablet TAKE 1 TAB EVERY 2 HOURS AS NEEDED FOR MIGRAINE. MAY REPEAT IN 2 HOURS IF PERSISTS OR RECURS 08/30/19   [name], MD  topiramate (TOPAMAX) 50 MG tablet Take 1 tablet (50 mg total) by mouth 2 (two) times daily. 03/25/20   [name], MD  traZODone (DESYREL) 100 MG tablet Take 100 mg by mouth at bedtime. 07/29/14   [provider]    Physical Exam: BP 121/74   Pulse 86   Temp 98.7 F (37.1 C)   Resp 18   Ht _0  (1.626 m)   Wt 108 kg   SpO2 99%   BMI 40.85 kg/m   General: 55 y.o. year-old female well developed well nourished in no acute distress.  Alert and oriented x3. HEENT: NCAT, EOMI Neck: Supple, trachea medial Cardiovascular: Regular rate and rhythm with no rubs or gallops.  No thyromegaly or JVD noted.  No lower extremity edema. 2/4 pulses in all 4 extremities. Respiratory: Clear to auscultation with no wheezes or rales. Good inspiratory effort. Abdomen: Soft, nontender nondistended with normal bowel sounds x4 quadrants. Muskuloskeletal: Right transmetatarsal amputation with surrounding erythema.  Purulent also (0.5 cm) noted on plantar surface of right foot.  With decreased range of motion of the distal tip.   Neuro: CN II-XII intact, strength 5/5 x 4, sensation, reflexes intact Skin: No ulcerative lesions noted or rashes Psychiatry: Mood is appropriate for condition and setting          Labs on Admission:  Basic Metabolic Panel: Recent Labs  Lab 10/20/20 2139  NA 135  K 3.6  CL 104  CO2 24  GLUCOSE 103*  BUN 12  CREATININE 0.66  CALCIUM 8.8*   Liver  Function Tests: Recent Labs  Lab 10/20/20 2139  AST 17  ALT 14  ALKPHOS 77  BILITOT 0.3  PROT 6.7  ALBUMIN 3.4*   No results for input(s): LIPASE, AMYLASE in the last 168 hours. No results for input(s): AMMONIA in the last 168 hours. CBC: Recent Labs  Lab 10/20/20 2139  WBC 7.5  NEUTROABS 4.6  HGB 12.3  HCT 36.3  MCV 95.5  PLT 243   Cardiac Enzymes: No results for input(s): CKTOTAL, CKMB, CKMBINDEX, TROPONINI in the last 168 hours.  BNP (last 3 results) No results for input(s): BNP in the last 8760 hours.  ProBNP (last 3 results) No results for input(s): PROBNP in the last 8760 hours.  CBG: No results for input(s): GLUCAP in the last 168 hours.  Radiological Exams on Admission: DG Finger Middle Right  Result Date: 10/20/2020 CLINICAL DATA:  Wound to the third digit. Wound in the distal aspect for 2 weeks. EXAM: RIGHT MIDDLE FINGER 2+V COMPARISON:  None. FINDINGS: There is no evidence of fracture or dislocation. No erosion, periosteal reaction or bone destruction. 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 Skin irregularity of the distal digit. No tracking soft tissue air. No radiopaque foreign body. IMPRESSION: Soft tissue irregularity of the distal digit. No radiopaque foreign body or acute osseous abnormality. Osteoarthritis. Electronically Signed   By: Keith Rake M.D.   On: 10/20/2020 23:43   DG Foot Complete Right  Result Date: 10/20/2020 CLINICAL DATA:  Foot ulcer with concern for infection. EXAM: RIGHT FOOT COMPLETE - 3+ VIEW COMPARISON:  Most recent radiograph 05/18/2019 FINDINGS: Five ray transmetatarsal amputation. Resection margins are smooth. There is no erosion, bone destruction or periosteal reaction. Mild soft tissue prominence about the distal stump with questionable ulcer overlying the region of the second metatarsal. No tracking soft tissue air. No radiopaque foreign body. No fracture. Achilles tendon enthesophyte and  plantar fascial calcifications again seen. IMPRESSION: 1. Previous transmetatarsal amputation without radiographic findings of osteomyelitis. 2. Soft tissue prominence about the distal stump with questionable ulcer overlying the region of the second metatarsal. Electronically Signed   By: Keith Rake M.D.   On: 10/20/2020 22:25    EKG: I independently viewed the EKG done and my findings are as followed: EKG was not done in the ED  Assessment/Plan Present on Admission:  Open wound of right foot, initial encounter  Depression  Essential hypertension  Asthma  Anxiety  Principal Problem:   Open wound of right foot, initial encounter Active Problems:   GERD (gastroesophageal reflux disease)   Depression   Anxiety   Asthma   Essential hypertension   Finger ulcer ([place])  Open wound of right foot with possible superimposed cellulitis Right middle finger ulcer Right foot x-ray showed no findings of osteomyelitis Patient was empirically started on IV vancomycin and cefepime, we shall continue with IV vancomycin at this time.  Wound culture on 03/17/2020 was positive for Staph aureus which was sensitive to vancomycin Continue Tylenol 650 mg every 6 hours as needed Continue wound care Consider podiatrist/surgical consult for worsening of symptoms  Essential hypertension (controlled) Continue Toprol  GERD Continue Protonix  Depression and anxiety Continue Cymbalta and BuSpar  Asthma Continue albuterol  DVT prophylaxis: SCDs   Code Status: Full code  Family Communication: None at bedside  Disposition Plan:  Patient is from:                        home Anticipated DC to:  SNF or family members home Anticipated DC date:               2-3 days patient requires Anticipated barriers:  Patient requires inpatient management due to right foot wound/ulcer requiring IV antibiotics  Consults called: None  Admission status: Inpatient    Bernadette Hoit MD [facility]  10/21/2020, 4:31 AM

## 2020-10-21 NOTE — Telephone Encounter (Signed)
Received call from patient.   Reports that insurance prefers Malathion (Ovide).   Ok to order?

## 2020-10-21 NOTE — Progress Notes (Signed)
Pharmacy Antibiotic Note  [name] [name] is a 55 y.o. female admitted on 10/20/2020 with RLE pain/cellulitis.  Pharmacy has been consulted for Vancomycin  dosing.  Vancomycin 1500 mg IV given in ED at  [facility]: Vancomycin 1250 mg IV q12h  Height: 5\' 4"  (162.6 cm) Weight: 108 kg (238 lb) IBW/kg (Calculated) : 54.7  Temp (24hrs), Avg:98.7 F (37.1 C), Min:98.7 F (37.1 C), Max:98.7 F (37.1 C)  Recent Labs  Lab 10/20/20 2139 10/20/20 2143 10/21/20 0029  WBC 7.5  --   --   CREATININE 0.66  --   --   LATICACIDVEN  --  1.8 1.9    Estimated Creatinine Clearance: 93.1 mL/min (by C-G formula based on SCr of 0.66 mg/dL).    Allergies  Allergen Reactions   Other    Penicillins Hives and Rash    No anaphylactic or severe cutaneous Sx > 10 yr ago, no additional medical attention required Tolerates Keflex and Rocephin   Tetracyclines & Related Other (See Comments)    Syncope and put her "in a coma"   Tramadol Other (See Comments)    Seizures   Phenazopyridine Other (See Comments)    Unknown   Tetracycline Other (See Comments)    Syncope and "Put me in a coma"   Ciprofloxacin Rash and Itching   Codeine Itching and Rash   Estradiol Rash    Patches broke out the skin     [name] 10/21/2020 4:28 AM

## 2020-10-21 NOTE — ED Notes (Addendum)
Pt verbalized she took a home test and tested positive for covid about one week ago. Pt did not get covid vaccinations.

## 2020-10-21 NOTE — Addendum Note (Signed)
Addended by: Sheral Flow on: 10/21/2020 12:34 PM   Modules accepted: Orders

## 2020-10-21 NOTE — ED Notes (Signed)
Nurse contacted Pharmacy to make aware there are no remdesivir in pixis.

## 2020-10-21 NOTE — ED Notes (Signed)
Date and time results received: 10/21/20 0054 (use smartphrase ".now" to insert current time)  Test: COVID Critical Value: positive  Name of Provider Notified: Vevelyn Francois, MD

## 2020-10-22 ENCOUNTER — Inpatient Hospital Stay (HOSPITAL_COMMUNITY): Payer: Medicare Other

## 2020-10-22 DIAGNOSIS — U071 COVID-19: Secondary | ICD-10-CM

## 2020-10-22 MED ORDER — LINACLOTIDE 145 MCG PO CAPS
290.0000 ug | ORAL_CAPSULE | Freq: Every day | ORAL | Status: DC
  Administered 2020-10-26: 290 ug via ORAL
  Filled 2020-10-22 (×6): qty 2

## 2020-10-22 MED ORDER — ESCITALOPRAM OXALATE 10 MG PO TABS
10.0000 mg | ORAL_TABLET | Freq: Every day | ORAL | Status: DC
  Administered 2020-10-22 – 2020-10-26 (×5): 10 mg via ORAL
  Filled 2020-10-22 (×5): qty 1

## 2020-10-22 MED ORDER — TRAZODONE HCL 100 MG PO TABS
100.0000 mg | ORAL_TABLET | Freq: Every day | ORAL | Status: DC
  Administered 2020-10-22 – 2020-10-25 (×4): 100 mg via ORAL
  Filled 2020-10-22: qty 1
  Filled 2020-10-22: qty 2
  Filled 2020-10-22 (×2): qty 1

## 2020-10-22 MED ORDER — CYCLOBENZAPRINE HCL 10 MG PO TABS
10.0000 mg | ORAL_TABLET | Freq: Two times a day (BID) | ORAL | Status: DC | PRN
  Administered 2020-10-23 – 2020-10-26 (×2): 10 mg via ORAL
  Filled 2020-10-22 (×2): qty 1

## 2020-10-22 MED ORDER — MELOXICAM 7.5 MG PO TABS
15.0000 mg | ORAL_TABLET | Freq: Every day | ORAL | Status: DC
  Administered 2020-10-22 – 2020-10-25 (×4): 15 mg via ORAL
  Filled 2020-10-22 (×5): qty 2

## 2020-10-22 MED ORDER — SUMATRIPTAN SUCCINATE 50 MG PO TABS
50.0000 mg | ORAL_TABLET | ORAL | Status: DC | PRN
  Administered 2020-10-22 – 2020-10-24 (×2): 50 mg via ORAL
  Filled 2020-10-22 (×3): qty 1

## 2020-10-22 MED ORDER — PREGABALIN 100 MG PO CAPS
100.0000 mg | ORAL_CAPSULE | Freq: Two times a day (BID) | ORAL | Status: DC
  Administered 2020-10-22 – 2020-10-26 (×9): 100 mg via ORAL
  Filled 2020-10-22 (×4): qty 1
  Filled 2020-10-22 (×2): qty 2
  Filled 2020-10-22 (×3): qty 1

## 2020-10-22 MED ORDER — TOPIRAMATE 25 MG PO TABS
50.0000 mg | ORAL_TABLET | Freq: Two times a day (BID) | ORAL | Status: DC
  Administered 2020-10-22 – 2020-10-26 (×9): 50 mg via ORAL
  Filled 2020-10-22 (×10): qty 2

## 2020-10-22 MED ORDER — HALOPERIDOL 5 MG PO TABS
5.0000 mg | ORAL_TABLET | Freq: Every day | ORAL | Status: DC
  Administered 2020-10-22 – 2020-10-25 (×4): 5 mg via ORAL
  Filled 2020-10-22 (×5): qty 1

## 2020-10-22 NOTE — Progress Notes (Signed)
Received call that [place] for cellulitis of her foot. 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  I last saw her in February for this and she was referred to the wound care center and has had treatment by them since then.  I independently reviewed the MRI and concern for abscess at the wound site.  Likely is going to need to have I&D performed as well as deep wound cultures.  Patient is to be transferred to [facility].  We will see the patient when she gets there.  Possible surgery on Friday pending evaluation.  Celesta Gentile, DPM

## 2020-10-22 NOTE — Progress Notes (Signed)
Pt A&Ox4, BP 132/81 (BP Location: Right Arm)   Pulse 80   Temp 98.3 F (36.8 C) (Oral)   Resp 18   Ht 5\' 4"  (1.626 m)   Wt 108 kg   SpO2 100%   BMI 40.85 kg/m  pt to be transported to [facility] for draining of abcess in foot. Report called to [place] on 6th floor. All evening meds given except Vanc.  Louanne Skye RN 10:29 PM 10/22/20

## 2020-10-22 NOTE — Progress Notes (Signed)
error 

## 2020-10-22 NOTE — Progress Notes (Signed)
PROGRESS NOTE    [name] [name]  YWV:[id number] DOB: [birth year] DOA: 10/20/2020 PCP: [name], MD    Brief Narrative:  55 year old female with history of right transmetatarsal amputation, hypertension, is brought to the hospital with ulceration and infection of her right foot stump.  Imaging indicated underlying abscess without any obvious osteomyelitis.  She has been started on antibiotics.  Case was reviewed with her podiatrist, Dr. Jacqualyn Posey who felt that abscess needed to be drained.  She is being transferred to [facility] for further treatments.   Assessment & Plan:   Principal Problem:   Open wound of right foot, initial encounter Active Problems:   GERD (gastroesophageal reflux disease)   Depression   Anxiety   Asthma   Essential hypertension   Finger ulcer ([place])   Right foot transmetatarsal amputation stump infection -Admitted with cellulitis of surrounding area -MRI shows concern for developing abscess.  No indication for osteomyelitis -She is previously followed podiatry, Dr. Jacqualyn Posey.   -Case reviewed with Dr. Jacqualyn Posey and it was felt that her foot needs to be drained.  We will plan on transfer to [facility] for further treatments. -Currently on intravenous vancomycin  Depression/anxiety -Continue Cymbalta and BuSpar  COVID-positive infection -Patient is unvaccinated -Appears to be asymptomatic, no shortness of breath -We will start on prophylactic remdesivir -No hypoxia, does not be criteria for steroids  Hypertension -Stable on metoprolol  Chronic pain syndrome -Continue on Cymbalta and Lyrica  GERD -Continue on Protonix  Morbid obesity -BMI 40.8 -Increased risk of morbidity and mortality   DVT prophylaxis: SCDs Start: 10/21/20 0530  Code Status: Full code Family Communication: discussed with patient Disposition Plan: Status is: Inpatient  Remains inpatient appropriate because:IV treatments appropriate due to intensity of illness  or inability to take PO  Dispo: The patient is from: Home              Anticipated d/c is to: Home              Patient currently is not medically stable to d/c.   Difficult to place patient No         Consultants:    Procedures:    Antimicrobials:  Vancomycin 7/5 >   Subjective: She denies any pain in her foot.  Overall bleeding from her ulcer appears to have stopped  Objective: Vitals:   10/21/20 1940 10/22/20 0427 10/22/20 0945 10/22/20 1300  BP: 128/76 131/75 138/86 132/81  Pulse: 78 79 82 80  Resp: 20 19 18 18   Temp: 98.9 F (37.2 C) 98.2 F (36.8 C)  98.3 F (36.8 C)  TempSrc: Oral   Oral  SpO2: 97% 99% 100% 100%  Weight:      Height:        Intake/Output Summary (Last 24 hours) at 10/22/2020 1804 Last data filed at 10/22/2020 1300 Gross per 24 hour  Intake 360 ml  Output --  Net 360 ml   Filed Weights   10/20/20 1931  Weight: 108 kg    Examination:  General exam: Appears calm and comfortable  Respiratory system: Clear to auscultation. Respiratory effort normal. Cardiovascular system: S1 & S2 heard, RRR. No JVD, murmurs, rubs, gallops or clicks. No pedal edema. Gastrointestinal system: Abdomen is nondistended, soft and nontender. No organomegaly or masses felt. Normal bowel sounds heard. Central nervous system: Alert and oriented. No focal neurological deficits. Extremities: Symmetric 5 x 5 power. Skin: ulcer on right foot as below Psychiatry: Judgement and insight appear normal. Mood &  affect appropriate.      Data Reviewed: I have personally reviewed following labs and imaging studies  CBC: Recent Labs  Lab 10/20/20 2139 10/21/20 0536  WBC 7.5 7.5  NEUTROABS 4.6  --   HGB 12.3 11.1*  HCT 36.3 32.7*  MCV 95.5 95.6  PLT 243 623   Basic Metabolic Panel: Recent Labs  Lab 10/20/20 2139 10/21/20 0536  NA 135 135  K 3.6 3.7  CL 104 104  CO2 24 22  GLUCOSE 103* 104*  BUN 12 13  CREATININE 0.66 0.59  CALCIUM 8.8* 8.4*  MG  --   1.8  PHOS  --  3.6   GFR: Estimated Creatinine Clearance: 93.1 mL/min (by C-G formula based on SCr of 0.59 mg/dL). Liver Function Tests: Recent Labs  Lab 10/20/20 2139 10/21/20 0536  AST 17 15  ALT 14 13  ALKPHOS 77 66  BILITOT 0.3 0.4  PROT 6.7 6.2*  ALBUMIN 3.4* 3.1*   No results for input(s): LIPASE, AMYLASE in the last 168 hours. No results for input(s): AMMONIA in the last 168 hours. Coagulation Profile: Recent Labs  Lab 10/21/20 0536  INR 1.0   Cardiac Enzymes: No results for input(s): CKTOTAL, CKMB, CKMBINDEX, TROPONINI in the last 168 hours. BNP (last 3 results) No results for input(s): PROBNP in the last 8760 hours. HbA1C: No results for input(s): HGBA1C in the last 72 hours. CBG: No results for input(s): GLUCAP in the last 168 hours. Lipid Profile: No results for input(s): CHOL, HDL, LDLCALC, TRIG, CHOLHDL, LDLDIRECT in the last 72 hours. Thyroid Function Tests: No results for input(s): TSH, T4TOTAL, FREET4, T3FREE, THYROIDAB in the last 72 hours. Anemia Panel: No results for input(s): VITAMINB12, FOLATE, FERRITIN, TIBC, IRON, RETICCTPCT in the last 72 hours. Sepsis Labs: Recent Labs  Lab 10/20/20 2143 10/21/20 0029  LATICACIDVEN 1.8 1.9    Recent Results (from the past 240 hour(s))  Culture, blood (routine x 2)     Status: None (Preliminary result)   Collection Time: 10/20/20 10:14 PM   Specimen: BLOOD  Result Value Ref Range Status   Specimen Description BLOOD LEFT CEPHALIC  Final   Special Requests   Final    BOTTLES DRAWN AEROBIC AND ANAEROBIC Blood Culture adequate volume   Culture   Final    NO GROWTH 2 DAYS Performed at [facility], [address], [place], [place] [address]    Report Status PENDING  Incomplete  Culture, blood (routine x 2)     Status: None (Preliminary result)   Collection Time: 10/20/20 10:14 PM   Specimen: BLOOD LEFT HAND  Result Value Ref Range Status   Specimen Description BLOOD LEFT HAND  Final   Special Requests    Final    AEROBIC BOTTLE ONLY Blood Culture results may not be optimal due to an inadequate volume of blood received in culture bottles   Culture   Final    NO GROWTH 2 DAYS Performed at [facility], [address] [place], [place] [address]    Report Status PENDING  Incomplete  Resp Panel by RT-PCR (Flu A&B, Covid) Nasopharyngeal Swab     Status: Abnormal   Collection Time: 10/20/20 11:20 PM   Specimen: Nasopharyngeal Swab; Nasopharyngeal(NP) swabs in vial transport medium  Result Value Ref Range Status   SARS Coronavirus 2 by RT PCR POSITIVE (A) NEGATIVE Final    Comment: RESULT CALLED TO, READ BACK BY AND VERIFIED WITH: K NICHOLS,RN@0049  10/21/20 MKELLY (NOTE) SARS-CoV-2 target nucleic acids are DETECTED.  The SARS-CoV-2 RNA is generally detectable in upper respiratory specimens during the acute phase of infection. Positive results are indicative of the presence of the identified virus, but do not rule out bacterial infection or co-infection with other pathogens not detected by the test. Clinical correlation with patient history and other diagnostic information is necessary to determine patient infection status. The expected result is Negative.  Fact Sheet for Patients: EntrepreneurPulse.com.au  Fact Sheet for Healthcare Providers: IncredibleEmployment.be  This test is not yet approved or cleared by the [place] FDA and  has been authorized for detection and/or diagnosis of SARS-CoV-2 by FDA under an Emergency Use Authorization (EUA).  This EUA will remain in effect (meaning this test can be  used) for the duration of  the COVID-19 declaration under Section 564(b)(1) of the Act, 21 U.S.C. section 360bbb-3(b)(1), unless the authorization is terminated or revoked sooner.     Influenza A by PCR NEGATIVE NEGATIVE Final   Influenza B by PCR NEGATIVE NEGATIVE Final    Comment: (NOTE) The Xpert Xpress SARS-CoV-2/FLU/RSV plus assay is  intended as an aid in the diagnosis of influenza from Nasopharyngeal swab specimens and should not be used as a sole basis for treatment. Nasal washings and aspirates are unacceptable for Xpert Xpress SARS-CoV-2/FLU/RSV testing.  Fact Sheet for Patients: EntrepreneurPulse.com.au  Fact Sheet for Healthcare Providers: IncredibleEmployment.be  This test is not yet approved or cleared by the [place] FDA and has been authorized for detection and/or diagnosis of SARS-CoV-2 by FDA under an Emergency Use Authorization (EUA). This EUA will remain in effect (meaning this test can be used) for the duration of the COVID-19 declaration under Section 564(b)(1) of the Act, 21 U.S.C. section 360bbb-3(b)(1), unless the authorization is terminated or revoked.  Performed at [facility], [address], [place], [place] [address]          Radiology Studies: MR FOOT RIGHT WO CONTRAST  Result Date: 10/22/2020 CLINICAL DATA:  Right foot stump infection. EXAM: MRI OF THE RIGHT FOREFOOT WITHOUT CONTRAST TECHNIQUE: Multiplanar, multisequence MR imaging of the right but was performed. No intravenous contrast was administered. COMPARISON:  Right foot x-rays dated October 20, 2020. MRI right foot dated May 16, 2019. FINDINGS: Bones/Joint/Cartilage Prior transmetatarsal amputation. No marrow signal abnormality. No fracture or dislocation. Joint spaces are preserved. Small posterior subtalar joint effusion. Ligaments Medial and lateral ankle ligaments are intact. Muscles and Tendons Split tear of the peroneal brevis tendon. Remaining tendons are intact. Soft tissue Soft tissue swelling of the amputation stump with small ulceration along the medial aspect. Suspected developing fluid collection measuring 1.8 x 1.9 x 2.0 cm (series 4, image 22) draining to the ulcer. IMPRESSION: 1. Prior transmetatarsal amputation. Soft tissue swelling of the amputation stump with small  ulceration along the medial aspect. Suspected developing 1.8 x 1.9 x 2.0 cm abscess draining to the ulcer. 2. No evidence of osteomyelitis. 3. Split tear of the peroneal brevis tendon. Electronically Signed   By: Titus Dubin M.D.   On: 10/22/2020 15:27   DG Finger Middle Right  Result Date: 10/20/2020 CLINICAL DATA:  Wound to the third digit. Wound in the distal aspect for 2 weeks. EXAM: RIGHT MIDDLE FINGER 2+V COMPARISON:  None. FINDINGS: There is no evidence of fracture or dislocation. No erosion, periosteal reaction or bone destruction. 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 Skin irregularity of the distal digit. No tracking soft tissue air. No radiopaque foreign body. IMPRESSION: Soft tissue irregularity of the distal digit.  No radiopaque foreign body or acute osseous abnormality. Osteoarthritis. Electronically Signed   By: Keith Rake M.D.   On: 10/20/2020 23:43   DG Foot Complete Right  Result Date: 10/20/2020 CLINICAL DATA:  Foot ulcer with concern for infection. EXAM: RIGHT FOOT COMPLETE - 3+ VIEW COMPARISON:  Most recent radiograph 05/18/2019 FINDINGS: Five ray transmetatarsal amputation. Resection margins are smooth. There is no erosion, bone destruction or periosteal reaction. Mild soft tissue prominence about the distal stump with questionable ulcer overlying the region of the second metatarsal. No tracking soft tissue air. No radiopaque foreign body. No fracture. Achilles tendon enthesophyte and plantar fascial calcifications again seen. IMPRESSION: 1. Previous transmetatarsal amputation without radiographic findings of osteomyelitis. 2. Soft tissue prominence about the distal stump with questionable ulcer overlying the region of the second metatarsal. Electronically Signed   By: Keith Rake M.D.   On: 10/20/2020 22:25        Scheduled Meds:  busPIRone  15 mg Oral TID   DULoxetine  60 mg Oral Daily   escitalopram  10 mg Oral Daily    haloperidol  5 mg Oral QHS   [START ON 10/23/2020] linaclotide  290 mcg Oral QAC breakfast   meloxicam  15 mg Oral QHS   metoprolol succinate  25 mg Oral Daily   pantoprazole  40 mg Oral BID   pregabalin  100 mg Oral BID   topiramate  50 mg Oral BID   traZODone  100 mg Oral QHS   Continuous Infusions:  remdesivir 100 mg in NS 100 mL 100 mg (10/22/20 1532)   vancomycin 1,250 mg (10/22/20 1035)     LOS: 1 day    Time spent: 78mins    Kathie Dike, MD [facility]   If 7PM-7AM, please contact night-coverage [email or web address]  10/22/2020, 6:04 PM

## 2020-10-23 ENCOUNTER — Encounter (HOSPITAL_BASED_OUTPATIENT_CLINIC_OR_DEPARTMENT_OTHER): Payer: Medicare Other | Admitting: Internal Medicine

## 2020-10-23 DIAGNOSIS — L97511 Non-pressure chronic ulcer of other part of right foot limited to breakdown of skin: Secondary | ICD-10-CM

## 2020-10-23 MED ORDER — CHLORHEXIDINE GLUCONATE CLOTH 2 % EX PADS
6.0000 | MEDICATED_PAD | Freq: Once | CUTANEOUS | Status: AC
  Administered 2020-10-23: 6 via TOPICAL

## 2020-10-23 MED ORDER — CHLORHEXIDINE GLUCONATE CLOTH 2 % EX PADS
6.0000 | MEDICATED_PAD | Freq: Once | CUTANEOUS | Status: AC
  Administered 2020-10-24: 6 via TOPICAL

## 2020-10-23 MED ORDER — ONDANSETRON HCL 4 MG/2ML IJ SOLN
4.0000 mg | Freq: Four times a day (QID) | INTRAMUSCULAR | Status: DC | PRN
  Administered 2020-10-23: 4 mg via INTRAVENOUS
  Filled 2020-10-23 (×2): qty 2

## 2020-10-23 NOTE — Progress Notes (Signed)
Patient arrived to room 6N21 via transport from [facility] for further care. Patient alert and oriented x4. Will continue to assess. On call doctor notified.

## 2020-10-23 NOTE — Consult Note (Signed)
Reason for Consult:Abscess Referring Physician: Dr. Kathie Dike, MD  [name] [name] is an 55 y.o. female.  HPI: 55 year old female admitted to [facility] on October 20, 2020 for concerns of worsening infection, cellulitis of her right foot.  She had a chronic ulceration to plantar aspect of right foot.  She is.  She been to wound care and the wound did heal however she states that the wound is subsequently open after being discharged in the wound care center.  She states that before coming to the hospital she came in from the store and noticed a lot of blood on her sock and drainage.  She is admitted to the hospital for cellulitis.  MRI was concerning for developing abscess and podiatry is consulted and recommended transfer to [facility]  Past Medical History:  Diagnosis Date   Anxiety    Arthritis    Phreesia 02/08/2020   Asthma    mild intermittent   Asthma    Phreesia 02/08/2020   Bipolar 1 disorder ([place])    ect treatments last treatment Sep 02 1011   Depression    Depression    Phreesia 02/08/2020   Depression    Phreesia 07/10/2020   GERD (gastroesophageal reflux disease)    Hypertension    Pre-diabetes    Seizures ([place])    last seizure was so long ago she can't remember.    Sleep apnea    wears CPAP, uncertain of setting   Substance abuse ([place])    Phreesia 02/08/2020    Past Surgical History:  Procedure Laterality Date   ABDOMINAL HYSTERECTOMY     AMPUTATION TOE Left 02/02/2018   Procedure: AMPUTATION TOE Left 4th toe;  Surgeon: [name], DPM;  Location: [place];  Service: Podiatry;  Laterality: Left;   APPENDECTOMY N/A    Phreesia 02/08/2020   BACK SURGERY     CARPAL TUNNEL RELEASE     x2   Laproscopic knee surgery     NECK SURGERY  03/15/2017   PLANTAR FASCIA RELEASE     x2   SPINE SURGERY N/A    Phreesia 02/08/2020   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   Procedure: TRANSMETATARSAL AMPUTATION;  Surgeon: [name], DPM;   Location: [facility];  Service: Podiatry;  Laterality: Right;    Family History  Problem Relation Age of Onset   Diabetes Mother    COPD Mother    Hypertension Mother    Hyperlipidemia Mother    Heart disease Father    Hyperlipidemia Father    Hypertension Father    Cancer Father    Heart disease Brother    Heart disease Daughter    Heart disease Maternal Grandmother    Hypertension Maternal Grandmother    Heart disease Maternal Grandfather    Kidney cancer Paternal Grandmother    Heart disease Paternal Grandfather     Social History:  reports that she has never smoked. She has never used smokeless tobacco. She reports that she does not drink alcohol and does not use drugs.  Allergies:  Allergies  Allergen Reactions   Other    Penicillins Hives and Rash    No anaphylactic or severe cutaneous Sx > 10 yr ago, no additional medical attention required Tolerates Keflex and Rocephin   Tetracyclines & Related Other (See Comments)    Syncope and put her "in a coma"   Tramadol Other (See Comments)    Seizures   Phenazopyridine Other (See Comments)    Unknown  Tetracycline Other (See Comments)    Syncope and "Put me in a coma"   Ciprofloxacin Rash and Itching   Codeine Itching and Rash   Estradiol Rash    Patches broke out the skin    Medications: I have reviewed the patient's current medications.  No results found for this or any previous visit (from the past 48 hour(s)).  MR FOOT RIGHT WO CONTRAST  Result Date: 10/22/2020 CLINICAL DATA:  Right foot stump infection. EXAM: MRI OF THE RIGHT FOREFOOT WITHOUT CONTRAST TECHNIQUE: Multiplanar, multisequence MR imaging of the right but was performed. No intravenous contrast was administered. COMPARISON:  Right foot x-rays dated October 20, 2020. MRI right foot dated May 16, 2019. FINDINGS: Bones/Joint/Cartilage Prior transmetatarsal amputation. No marrow signal abnormality. No fracture or dislocation. Joint spaces are preserved. Small  posterior subtalar joint effusion. Ligaments Medial and lateral ankle ligaments are intact. Muscles and Tendons Split tear of the peroneal brevis tendon. Remaining tendons are intact. Soft tissue Soft tissue swelling of the amputation stump with small ulceration along the medial aspect. Suspected developing fluid collection measuring 1.8 x 1.9 x 2.0 cm (series 4, image 22) draining to the ulcer. IMPRESSION: 1. Prior transmetatarsal amputation. Soft tissue swelling of the amputation stump with small ulceration along the medial aspect. Suspected developing 1.8 x 1.9 x 2.0 cm abscess draining to the ulcer. 2. No evidence of osteomyelitis. 3. Split tear of the peroneal brevis tendon. Electronically Signed   By: Titus Dubin M.D.   On: 10/22/2020 15:27    Review of Systems Blood pressure 131/67, pulse 68, temperature 98.4 F (36.9 C), temperature source Oral, resp. rate 18, height 5\' 4"  (1.626 m), weight 108 kg, SpO2 98 %. Physical Exam General: AAO x3, NAD  Dermatological: Ulceration plantar aspect of right plantar medial foot.  Localized edema and erythema to the area but there is no fluctuation.  There is no drainage or pus identified today but there is probing of the wound present.  There is no significant erythema or warmth of the foot.  Peers to localize.  Vascular: Dorsalis Pedis artery and Posterior Tibial artery pedal pulses are palpable bilateral with immedate capillary fill time.  No pain with calf compression, erythema or warmth  Neruologic: Sensation decreased and does have neuropathy  Musculoskeletal: Previous transmetatarsal amputation noted.  Assessment/Plan: Ulceration with abscess right foot  Independent reviewed the MRI.  Does reveal abscess.  Given the worsening infection as well as MRI I do recommend incision and drainage of the wound.  I discussed with her surgery versus conservative options and I have recommended surgical intervention.  She agrees.  We will plan on doing this  tomorrow.  We will plan on incision and drainage, debridement of the wound as well as possible graft application if able.  Discussed with her that if not able to graft in the operating room we will do this as an outpatient.  I discussed the surgery with the patient as well as alternatives risks and complications.  Discussed risks of surgery including spreading infection, further amputation, delayed or nonhealing as well as general risks of surgery including stroke, heart attack, death.  She did a surgical plan on doing this tomorrow.  She is scheduled as an add-on case tomorrow evening.  We will do n.p.o. after 8 AM on October 24, 2020.  Celesta Gentile, DPM  [name] 10/23/2020, 8:24 PM

## 2020-10-23 NOTE — Progress Notes (Signed)
Received call overnight that patient had arrived at [facility]. I will be by to see the patient later today.

## 2020-10-23 NOTE — Plan of Care (Signed)

## 2020-10-23 NOTE — Consult Note (Signed)
  Error  See consult note

## 2020-10-23 NOTE — Progress Notes (Signed)
[facility] Progress Note  Patient: [name] [name]    BMW:[id number]  DOA: 10/20/2020     Date of Service: the patient was seen and examined on 10/23/2020  Brief hospital course: Past medical history of HTN, TMA left leg, GERD, chronic pain, anxiety, migraine. Presented with right foot pain.  Found to have cellulitis.  Podiatry consulted.  Patient currently scheduled for IND tomorrow. Currently plan is continue IV antibiotics.  Subjective: Reports nausea.  No abdominal pain.  Reports 2 episodes of loose BM as well.  Reports 3 episodes of loose BM yesterday.  No blood in the stool.  No fever no chills.  No cough no chest pain.  No shortness of breath.  No dizziness lightheadedness.  Assessment and Plan: 1.  Right foot metatarsal amputation stump infection with cellulitis Admitted with concern for cellulitis. MRI performed shows developing abscess. No indication for osteomyelitis. Podiatry consulted, Dr. Earleen Newport. Currently scheduled for I&D tomorrow on 7/8 Currently on IV antibiotic which will be continued.  2.  COVID-19 infection Incidental. Unvaccinated. Does not have any complaints of cough fever or shortness of breath. Patient was started on remdesivir. Last day today. Chest x-ray unremarkable. Exam unremarkable.  On room air. Diarrhea likely associated with it.  3.  Nausea vomiting, diarrhea Monitor for now. Expect improvement. Currently no abdominal pain for further work-up.  4.  Depression, anxiety Continuing home Cymbalta and BuSpar  5.  Chronic pain syndrome Patient is on narcotics at home as well as Lyrica and Cymbalta. Will continue the same medication.  6.  GERD Continue PPI.  7.  Morbid obesity Body mass index is 40.85 kg/m.  Placing the patient at high risk for poor outcome.  Scheduled Meds:  busPIRone  15 mg Oral TID   DULoxetine  60 mg Oral Daily   escitalopram  10 mg Oral Daily   haloperidol  5 mg Oral QHS   linaclotide  290 mcg Oral QAC  breakfast   meloxicam  15 mg Oral QHS   metoprolol succinate  25 mg Oral Daily   pantoprazole  40 mg Oral BID   pregabalin  100 mg Oral BID   topiramate  50 mg Oral BID   traZODone  100 mg Oral QHS   Continuous Infusions:  remdesivir 100 mg in NS 100 mL 100 mg (10/23/20 1523)   vancomycin 1,250 mg (10/23/20 1043)   PRN Meds: acetaminophen, albuterol, cyclobenzaprine, ondansetron (ZOFRAN) IV, oxyCODONE, SUMAtriptan  Body mass index is 40.85 kg/m.    Pressure Ulcer 11/15/15 Unstageable - Full thickness tissue loss in which the base of the ulcer is covered by slough (yellow, tan, gray, green or brown) and/or eschar (tan, brown or black) in the wound bed. (Active)  11/15/15 1300  Location: Toe (Comment  which one)  Location Orientation: Left  Staging: Unstageable - Full thickness tissue loss in which the base of the ulcer is covered by slough (yellow, tan, gray, green or brown) and/or eschar (tan, brown or black) in the wound bed.  Wound Description (Comments):   Present on Admission:      DVT Prophylaxis:   SCDs Start: 10/21/20 0530    Advance goals of care discussion: Pt is Full code.  Family Communication: no family was present at bedside, at the time of interview.   Data Reviewed: No labs today.  Will check tomorrow for preop.  Physical Exam:  General: Appear in mild distress, no Rash; Oral Mucosa Clear, moist. no Abnormal Neck Mass Or lumps, Conjunctiva normal  Cardiovascular: S1 and S2 Present, no Murmur, Respiratory: good respiratory effort, Bilateral Air entry present and CTA, no Crackles, no wheezes Abdomen: Bowel Sound present, Soft and no tenderness Extremities: no Pedal edema Neurology: alert and oriented to time, place, and person affect appropriate. no new focal deficit Gait not checked due to patient safety concerns  Vitals:   10/22/20 2336 10/23/20 0614 10/23/20 0857 10/23/20 1229  BP: 129/80 119/71 126/79 (!) 141/87  Pulse: 74 64 74 69  Resp: 18 18 16  18   Temp: 98.6 F (37 C) 98 F (36.7 C) 98.6 F (37 C) 98.4 F (36.9 C)  TempSrc: Oral Oral Oral Oral  SpO2: 100% 99% 100% 99%  Weight:      Height:        Disposition:  Status is: Inpatient  Remains inpatient appropriate because:Inpatient level of care appropriate due to severity of illness  Dispo: The patient is from: Home              Anticipated d/c is to: Home              Patient currently is not medically stable to d/c.   Difficult to place patient No  Time spent: 35 minutes. I reviewed all nursing notes, pharmacy notes, vitals, pertinent old records. I have discussed plan of care as described above with RN.  Author: Berle Mull, MD [facility] 10/23/2020 5:12 PM  To reach On-call, see care teams to locate the attending and reach out via [email or web address]. Between 7PM-7AM, please contact night-coverage If you still have difficulty reaching the attending provider, please page the [facility] (Director on Call) for [facility] on amion for assistance.

## 2020-10-24 LAB — CBC WITH DIFFERENTIAL/PLATELET
Abs Immature Granulocytes: 0.02 10*3/uL (ref 0.00–0.07)
Basophils Absolute: 0 10*3/uL (ref 0.0–0.1)
Basophils Relative: 0 %
Eosinophils Absolute: 0.1 10*3/uL (ref 0.0–0.5)
Eosinophils Relative: 2 %
HCT: 35.6 % — ABNORMAL LOW (ref 36.0–46.0)
Hemoglobin: 12.2 g/dL (ref 12.0–15.0)
Immature Granulocytes: 0 %
Lymphocytes Relative: 39 %
Lymphs Abs: 2.3 10*3/uL (ref 0.7–4.0)
MCH: 31.9 pg (ref 26.0–34.0)
MCHC: 34.3 g/dL (ref 30.0–36.0)
MCV: 93 fL (ref 80.0–100.0)
Monocytes Absolute: 0.4 10*3/uL (ref 0.1–1.0)
Monocytes Relative: 6 %
Neutro Abs: 3.2 10*3/uL (ref 1.7–7.7)
Neutrophils Relative %: 53 %
Platelets: 267 10*3/uL (ref 150–400)
RBC: 3.83 MIL/uL — ABNORMAL LOW (ref 3.87–5.11)
RDW: 12.6 % (ref 11.5–15.5)
WBC: 6 10*3/uL (ref 4.0–10.5)
nRBC: 0 % (ref 0.0–0.2)

## 2020-10-24 LAB — COMPREHENSIVE METABOLIC PANEL
ALT: 21 U/L (ref 0–44)
AST: 33 U/L (ref 15–41)
Albumin: 3.4 g/dL — ABNORMAL LOW (ref 3.5–5.0)
Alkaline Phosphatase: 65 U/L (ref 38–126)
Anion gap: 6 (ref 5–15)
BUN: 8 mg/dL (ref 6–20)
CO2: 25 mmol/L (ref 22–32)
Calcium: 9.4 mg/dL (ref 8.9–10.3)
Chloride: 106 mmol/L (ref 98–111)
Creatinine, Ser: 0.67 mg/dL (ref 0.44–1.00)
GFR, Estimated: >60 mL/min (ref >60)
Glucose, Bld: 119 mg/dL — ABNORMAL HIGH (ref 70–99)
Potassium: 3.7 mmol/L (ref 3.5–5.1)
Sodium: 137 mmol/L (ref 135–145)
Total Bilirubin: 0.5 mg/dL (ref 0.3–1.2)
Total Protein: 6.6 g/dL (ref 6.5–8.1)

## 2020-10-24 LAB — SURGICAL PCR SCREEN
MRSA, PCR: NEGATIVE
Staphylococcus aureus: POSITIVE — AB

## 2020-10-24 LAB — MAGNESIUM: Magnesium: 2 mg/dL (ref 1.7–2.4)

## 2020-10-24 MED ORDER — CHLORHEXIDINE GLUCONATE CLOTH 2 % EX PADS
6.0000 | MEDICATED_PAD | Freq: Every day | CUTANEOUS | Status: DC
  Administered 2020-10-25 – 2020-10-26 (×2): 6 via TOPICAL

## 2020-10-24 MED ORDER — ENOXAPARIN SODIUM 40 MG/0.4ML IJ SOSY: 40.0000 mg | PREFILLED_SYRINGE | INTRAMUSCULAR | Status: DC

## 2020-10-24 MED ORDER — MUPIROCIN 2 % EX OINT
1.0000 "application " | TOPICAL_OINTMENT | Freq: Two times a day (BID) | CUTANEOUS | Status: DC
  Administered 2020-10-24 – 2020-10-26 (×4): 1 via NASAL
  Filled 2020-10-24: qty 22

## 2020-10-24 NOTE — Progress Notes (Addendum)
Updated patient about delay in surgery today. Spoke with the OR and due to staffing issues the surgery will likely be later today. If it starts to be too late tonight we will see about doing it in the morning. Will continue to update her.   ---  Update 6:50pm- an emergency came in and we are further delayed for surgery. Will move the surgery to tomorrow morning. I have informed the patient. NPO discontinued and will allow diet tonight. NPO after midnight tonight.   The wound is granular with probing. There is no purulence identified. There is localized edema and erythema around the wound without any ascending cellulitis. No fluctuation or crepitation. No malodor.

## 2020-10-24 NOTE — Progress Notes (Signed)
Pharmacy Antibiotic Note  [name] [name] is a 55 y.o. female admitted on 10/20/2020 with RLE pain/cellulitis. Vancomycin 1250 mg IV q12 since 7/5. Planned I&D for 7/8.  Plan: Continue vancomycin 1250 mg IV q12h  Height: 5\' 4"  (162.6 cm) Weight: 108 kg (238 lb) IBW/kg (Calculated) : 54.7  Temp (24hrs), Avg:98.4 F (36.9 C), Min:98.2 F (36.8 C), Max:98.7 F (37.1 C)  Recent Labs  Lab 10/20/20 2139 10/20/20 2143 10/21/20 0029 10/21/20 0536 10/24/20 0719  WBC 7.5  --   --  7.5 6.0  CREATININE 0.66  --   --  0.59 0.67  LATICACIDVEN  --  1.8 1.9  --   --      Estimated Creatinine Clearance: 93.1 mL/min (by C-G formula based on SCr of 0.67 mg/dL).    Allergies  Allergen Reactions   Other    Penicillins Hives and Rash    No anaphylactic or severe cutaneous Sx > 10 yr ago, no additional medical attention required Tolerates Keflex and Rocephin   Tetracyclines & Related Other (See Comments)    Syncope and put her "in a coma"   Tramadol Other (See Comments)    Seizures   Phenazopyridine Other (See Comments)    Unknown   Tetracycline Other (See Comments)    Syncope and "Put me in a coma"   Ciprofloxacin Rash and Itching   Codeine Itching and Rash   Estradiol Rash    Patches broke out the skin    Varney Daily, PharmD PGY1 [facility] Resident  Please check AMION for all [facility] pharmacy phone numbers After 10:00 PM call main pharmacy [phone]

## 2020-10-24 NOTE — Progress Notes (Signed)
PROGRESS NOTE    [name] [name]  VZD:[id number] DOB: [birth year] DOA: 10/20/2020 PCP: [name] Frizzle, MD   Brief Narrative: [name] [name] is a 55 y.o. female with a history of hypertension, GERD, chronic pain, anxiety, migraines. Patient presented secondary to foot pain in setting of ulcer and cellulitis. Podiatry consulted for surgical management; I&D planned.   Assessment & Plan:   Principal Problem:   Open wound of right foot, initial encounter Active Problems:   GERD (gastroesophageal reflux disease)   Depression   Anxiety   Asthma   Essential hypertension   Finger ulcer ([name])   Right metatarsal amputation stump infection Cellulitis of right foot stump Podiatry consulted with plan for surgical management today -Continue Vancomycin and Cefepime IV  COVID-19 infection Incidental finding. Asymptomatic. No evidence of pneumonia. Afebrile. Patient given a three day course of Remdesivir for which she has completed. Continue isolation precautions for a total of 10 days.  Nausea/Vomiting/Diarrhea Improved.  Depression Anxiety -Continue Lexapro, Cymbalta, Buspar, haldal, Topamax  Chronic pain syndrome -Continue oxycodone, flexeril prn  GERD -Continue Protonix  Morbid obesity Body mass index is 40.85 kg/m.   DVT prophylaxis: SCDs Start: 10/21/20 0530  Code Status:   Code Status: Full Code Family Communication: None at bedside Disposition Plan: Discharge home pending podiatry recommendations   Consultants:  Podiatry  Procedures:    Antimicrobials: Vancomycin IV Cefepime IV Remdesivir IV    Subjective: No concerns this afternoon.  Objective: Vitals:   10/23/20 1630 10/23/20 1955 10/24/20 0039 10/24/20 0537  BP: 133/76 131/67 (!) 142/84 138/78  Pulse: 69 68 67 71  Resp: 18 18 19 18   Temp: 98.7 F (37.1 C) 98.4 F (36.9 C) 98.2 F (36.8 C) 98.4 F (36.9 C)  TempSrc: Oral Oral Oral Oral  SpO2: 98% 98% 100% 100%  Weight:      Height:         Intake/Output Summary (Last 24 hours) at 10/24/2020 0610 Last data filed at 10/24/2020 0500 Gross per 24 hour  Intake 440 ml  Output --  Net 440 ml   Filed Weights   10/20/20 1931  Weight: 108 kg    Examination:  General exam: Appears calm and comfortable Respiratory system: Clear to auscultation. Respiratory effort normal. Cardiovascular system: S1 & S2 heard, RRR. No murmurs, rubs, gallops or clicks. Gastrointestinal system: Abdomen is nondistended, soft and nontender. No organomegaly or masses felt. Normal bowel sounds heard. Central nervous system: Alert and oriented. No focal neurological deficits. Musculoskeletal: No edema. No calf tenderness. Right foot stump with ulcer located dorsally and no surrounding erythema Skin: No cyanosis. No rashes Psychiatry: Judgement and insight appear normal. Mood & affect appropriate.     Data Reviewed: I have personally reviewed following labs and imaging studies  CBC Lab Results  Component Value Date   WBC 7.5 10/21/2020   RBC 3.42 (L) 10/21/2020   HGB 11.1 (L) 10/21/2020   HCT 32.7 (L) 10/21/2020   MCV 95.6 10/21/2020   MCH 32.5 10/21/2020   PLT 226 10/21/2020   MCHC 33.9 10/21/2020   RDW 12.5 10/21/2020   LYMPHSABS 2.3 10/20/2020   MONOABS 0.5 10/20/2020   EOSABS 0.1 10/20/2020   BASOSABS 0.0 29/51/8841     Last metabolic panel Lab Results  Component Value Date   NA 135 10/21/2020   K 3.7 10/21/2020   CL 104 10/21/2020   CO2 22 10/21/2020   BUN 13 10/21/2020   CREATININE 0.59 10/21/2020   GLUCOSE 104 (H) 10/21/2020  GFRNONAA >60 10/21/2020   GFRAA 119 06/27/2020   CALCIUM 8.4 (L) 10/21/2020   PHOS 3.6 10/21/2020   PROT 6.2 (L) 10/21/2020   ALBUMIN 3.1 (L) 10/21/2020   BILITOT 0.4 10/21/2020   ALKPHOS 66 10/21/2020   AST 15 10/21/2020   ALT 13 10/21/2020   ANIONGAP 9 10/21/2020    CBG (last 3)  No results for input(s): GLUCAP in the last 72 hours.   GFR: Estimated Creatinine Clearance: 93.1 mL/min  (by C-G formula based on SCr of 0.59 mg/dL).  Coagulation Profile: Recent Labs  Lab 10/21/20 0536  INR 1.0    Recent Results (from the past 240 hour(s))  Culture, blood (routine x 2)     Status: None (Preliminary result)   Collection Time: 10/20/20 10:14 PM   Specimen: BLOOD  Result Value Ref Range Status   Specimen Description BLOOD LEFT CEPHALIC  Final   Special Requests   Final    BOTTLES DRAWN AEROBIC AND ANAEROBIC Blood Culture adequate volume   Culture   Final    NO GROWTH 3 DAYS Performed at [facility], [address] [place], [place] [address]    Report Status PENDING  Incomplete  Culture, blood (routine x 2)     Status: None (Preliminary result)   Collection Time: 10/20/20 10:14 PM   Specimen: BLOOD LEFT HAND  Result Value Ref Range Status   Specimen Description BLOOD LEFT HAND  Final   Special Requests   Final    AEROBIC BOTTLE ONLY Blood Culture results may not be optimal due to an inadequate volume of blood received in culture bottles   Culture   Final    NO GROWTH 3 DAYS Performed at [facility] [address] [place], [place] [address]    Report Status PENDING  Incomplete  Resp Panel by RT-PCR (Flu A&B, Covid) Nasopharyngeal Swab     Status: Abnormal   Collection Time: 10/20/20 11:20 PM   Specimen: Nasopharyngeal Swab; Nasopharyngeal(NP) swabs in vial transport medium  Result Value Ref Range Status   SARS Coronavirus 2 by RT PCR POSITIVE (A) NEGATIVE Final    Comment: RESULT CALLED TO, READ BACK BY AND VERIFIED WITH: K NICHOLS,RN@0049  10/21/20 MKELLY (NOTE) SARS-CoV-2 target nucleic acids are DETECTED.  The SARS-CoV-2 RNA is generally detectable in upper respiratory specimens during the acute phase of infection. Positive results are indicative of the presence of the identified virus, but do not rule out bacterial infection or co-infection with other pathogens not detected by the test. Clinical correlation with patient history and other diagnostic  information is necessary to determine patient infection status. The expected result is Negative.  Fact Sheet for Patients: EntrepreneurPulse.com.au  Fact Sheet for Healthcare Providers: IncredibleEmployment.be  This test is not yet approved or cleared by the [place] FDA and  has been authorized for detection and/or diagnosis of SARS-CoV-2 by FDA under an Emergency Use Authorization (EUA).  This EUA will remain in effect (meaning this test can be  used) for the duration of  the COVID-19 declaration under Section 564(b)(1) of the Act, 21 U.S.C. section 360bbb-3(b)(1), unless the authorization is terminated or revoked sooner.     Influenza A by PCR NEGATIVE NEGATIVE Final   Influenza B by PCR NEGATIVE NEGATIVE Final    Comment: (NOTE) The Xpert Xpress SARS-CoV-2/FLU/RSV plus assay is intended as an aid in the diagnosis of influenza from Nasopharyngeal swab specimens and should not be used as a sole basis for treatment. Nasal washings and aspirates are unacceptable  for Xpert Xpress SARS-CoV-2/FLU/RSV testing.  Fact Sheet for Patients: EntrepreneurPulse.com.au  Fact Sheet for Healthcare Providers: IncredibleEmployment.be  This test is not yet approved or cleared by the [place] FDA and has been authorized for detection and/or diagnosis of SARS-CoV-2 by FDA under an Emergency Use Authorization (EUA). This EUA will remain in effect (meaning this test can be used) for the duration of the COVID-19 declaration under Section 564(b)(1) of the Act, 21 U.S.C. section 360bbb-3(b)(1), unless the authorization is terminated or revoked.  Performed at [facility], [address], [place], [place] [address]         Radiology Studies: MR FOOT RIGHT WO CONTRAST  Result Date: 10/22/2020 CLINICAL DATA:  Right foot stump infection. EXAM: MRI OF THE RIGHT FOREFOOT WITHOUT CONTRAST TECHNIQUE: Multiplanar,  multisequence MR imaging of the right but was performed. No intravenous contrast was administered. COMPARISON:  Right foot x-rays dated October 20, 2020. MRI right foot dated May 16, 2019. FINDINGS: Bones/Joint/Cartilage Prior transmetatarsal amputation. No marrow signal abnormality. No fracture or dislocation. Joint spaces are preserved. Small posterior subtalar joint effusion. Ligaments Medial and lateral ankle ligaments are intact. Muscles and Tendons Split tear of the peroneal brevis tendon. Remaining tendons are intact. Soft tissue Soft tissue swelling of the amputation stump with small ulceration along the medial aspect. Suspected developing fluid collection measuring 1.8 x 1.9 x 2.0 cm (series 4, image 22) draining to the ulcer. IMPRESSION: 1. Prior transmetatarsal amputation. Soft tissue swelling of the amputation stump with small ulceration along the medial aspect. Suspected developing 1.8 x 1.9 x 2.0 cm abscess draining to the ulcer. 2. No evidence of osteomyelitis. 3. Split tear of the peroneal brevis tendon. Electronically Signed   By: Titus Dubin M.D.   On: 10/22/2020 15:27        Scheduled Meds:  busPIRone  15 mg Oral TID   Chlorhexidine Gluconate Cloth  6 each Topical Once   DULoxetine  60 mg Oral Daily   escitalopram  10 mg Oral Daily   haloperidol  5 mg Oral QHS   linaclotide  290 mcg Oral QAC breakfast   meloxicam  15 mg Oral QHS   metoprolol succinate  25 mg Oral Daily   pantoprazole  40 mg Oral BID   pregabalin  100 mg Oral BID   topiramate  50 mg Oral BID   traZODone  100 mg Oral QHS   Continuous Infusions:  remdesivir 100 mg in NS 100 mL 100 mg (10/23/20 1523)   vancomycin 1,250 mg (10/23/20 2158)     LOS: 3 days     Cordelia Poche, MD [facility] 10/24/2020, 6:10 AM  If 7PM-7AM, please contact night-coverage [email or web address]

## 2020-10-25 ENCOUNTER — Encounter (HOSPITAL_COMMUNITY)
Admission: EM | Disposition: A | Discharge status: Home Health | Payer: Self-pay | Source: Home / Self Care | Attending: Family Medicine

## 2020-10-25 ENCOUNTER — Inpatient Hospital Stay (HOSPITAL_COMMUNITY): Payer: Medicare Other | Admitting: Certified Registered"

## 2020-10-25 DIAGNOSIS — L97521 Non-pressure chronic ulcer of other part of left foot limited to breakdown of skin: Secondary | ICD-10-CM

## 2020-10-25 HISTORY — PX: IRRIGATION AND DEBRIDEMENT ABSCESS: SHX5252

## 2020-10-25 LAB — CULTURE, BLOOD (ROUTINE X 2)
Culture: NO GROWTH
Culture: NO GROWTH
Special Requests: ADEQUATE

## 2020-10-25 SURGERY — IRRIGATION AND DEBRIDEMENT ABSCESS
Anesthesia: General | Site: Foot | Laterality: Right

## 2020-10-25 MED ORDER — FENTANYL CITRATE (PF) 100 MCG/2ML IJ SOLN: 25.0000 ug | INTRAMUSCULAR | Status: DC | PRN

## 2020-10-25 MED ORDER — MIDAZOLAM HCL 2 MG/2ML IJ SOLN
INTRAMUSCULAR | Status: AC
  Filled 2020-10-25: qty 2

## 2020-10-25 MED ORDER — MIDAZOLAM HCL 5 MG/5ML IJ SOLN
INTRAMUSCULAR | Status: DC | PRN
  Administered 2020-10-25: 2 mg via INTRAVENOUS

## 2020-10-25 MED ORDER — LIDOCAINE HCL 2 % IJ SOLN
INTRAMUSCULAR | Status: AC
  Filled 2020-10-25: qty 20

## 2020-10-25 MED ORDER — LIDOCAINE HCL (CARDIAC) PF 100 MG/5ML IV SOSY
PREFILLED_SYRINGE | INTRAVENOUS | Status: DC | PRN
  Administered 2020-10-25: 30 mg via INTRAVENOUS

## 2020-10-25 MED ORDER — FENTANYL CITRATE (PF) 250 MCG/5ML IJ SOLN
INTRAMUSCULAR | Status: AC
  Filled 2020-10-25: qty 5

## 2020-10-25 MED ORDER — BUPIVACAINE HCL (PF) 0.5 % IJ SOLN
INTRAMUSCULAR | Status: AC
  Filled 2020-10-25: qty 30

## 2020-10-25 MED ORDER — LIDOCAINE HCL 2 % IJ SOLN
INTRAMUSCULAR | Status: DC | PRN
  Administered 2020-10-25: 5 mL

## 2020-10-25 MED ORDER — BUPIVACAINE HCL (PF) 0.5 % IJ SOLN
INTRAMUSCULAR | Status: DC | PRN
  Administered 2020-10-25: 5 mL

## 2020-10-25 MED ORDER — PROPOFOL 10 MG/ML IV BOLUS
INTRAVENOUS | Status: DC | PRN
  Administered 2020-10-25: 200 mg via INTRAVENOUS

## 2020-10-25 MED ORDER — LACTATED RINGERS IV SOLN: INTRAVENOUS | Status: DC | PRN

## 2020-10-25 MED ORDER — 0.9 % SODIUM CHLORIDE (POUR BTL) OPTIME
TOPICAL | Status: DC | PRN
  Administered 2020-10-25: 1000 mL

## 2020-10-25 MED ORDER — ONDANSETRON HCL 4 MG/2ML IJ SOLN
INTRAMUSCULAR | Status: DC | PRN
  Administered 2020-10-25: 4 mg via INTRAVENOUS

## 2020-10-25 MED ORDER — VANCOMYCIN HCL 1000 MG IV SOLR
INTRAVENOUS | Status: AC
  Filled 2020-10-25: qty 1000

## 2020-10-25 SURGICAL SUPPLY — 37 items
BAG COUNTER SPONGE SURGICOUNT (BAG) ×2 IMPLANT
BAG SPNG CNTER NS LX DISP (BAG) ×1
BLADE LONG MED 31X9 (MISCELLANEOUS) IMPLANT
BNDG CMPR 9X4 STRL LF SNTH (GAUZE/BANDAGES/DRESSINGS) ×1
BNDG CMPR MD 5X2 ELC HKLP STRL (GAUZE/BANDAGES/DRESSINGS) ×1
BNDG CONFORM 2 STRL LF (GAUZE/BANDAGES/DRESSINGS) ×2 IMPLANT
BNDG ELASTIC 2 VLCR STRL LF (GAUZE/BANDAGES/DRESSINGS) ×1 IMPLANT
BNDG ELASTIC 3X5.8 VLCR STR LF (GAUZE/BANDAGES/DRESSINGS) ×2 IMPLANT
BNDG ESMARK 4X9 LF (GAUZE/BANDAGES/DRESSINGS) ×2 IMPLANT
BNDG GAUZE ELAST 4 BULKY (GAUZE/BANDAGES/DRESSINGS) ×2 IMPLANT
CUFF TOURN SGL QUICK 18X4 (TOURNIQUET CUFF) IMPLANT
DRSG EMULSION OIL 3X3 NADH (GAUZE/BANDAGES/DRESSINGS) ×2 IMPLANT
DRSG PAD ABDOMINAL 8X10 ST (GAUZE/BANDAGES/DRESSINGS) ×1 IMPLANT
DURAPREP 26ML APPLICATOR (WOUND CARE) ×2 IMPLANT
ELECT REM PT RETURN 9FT ADLT (ELECTROSURGICAL) ×2
ELECTRODE REM PT RTRN 9FT ADLT (ELECTROSURGICAL) ×1 IMPLANT
GAUZE SPONGE 4X4 12PLY STRL (GAUZE/BANDAGES/DRESSINGS) ×2 IMPLANT
GLOVE SURG ENC MOIS LTX SZ8 (GLOVE) ×4 IMPLANT
GOWN STRL REUS W/ TWL LRG LVL3 (GOWN DISPOSABLE) ×1 IMPLANT
GOWN STRL REUS W/ TWL XL LVL3 (GOWN DISPOSABLE) ×1 IMPLANT
GOWN STRL REUS W/TWL LRG LVL3 (GOWN DISPOSABLE) ×2
GOWN STRL REUS W/TWL XL LVL3 (GOWN DISPOSABLE) ×2
KIT BASIN OR (CUSTOM PROCEDURE TRAY) ×2 IMPLANT
MATRIX WOUND 3-LAYER 5X5 (Tissue) ×1 IMPLANT
MICROMATRIX 500MG (Tissue) ×2 IMPLANT
NDL HYPO 25X1 1.5 SAFETY (NEEDLE) ×1 IMPLANT
NEEDLE HYPO 25X1 1.5 SAFETY (NEEDLE) ×2 IMPLANT
NS IRRIG 1000ML POUR BTL (IV SOLUTION) IMPLANT
PACK ORTHO EXTREMITY (CUSTOM PROCEDURE TRAY) ×2 IMPLANT
SOLUTION PARTIC MCRMTRX 500MG (Tissue) IMPLANT
SUCTION FRAZIER HANDLE 10FR (MISCELLANEOUS) ×2
SUCTION TUBE FRAZIER 10FR DISP (MISCELLANEOUS) ×1 IMPLANT
SUT PROLENE 3 0 PS 2 (SUTURE) ×2 IMPLANT
SYR 10ML LL (SYRINGE) IMPLANT
TUBE CONNECTING 12X1/4 (SUCTIONS) ×2 IMPLANT
UNDERPAD 30X36 HEAVY ABSORB (UNDERPADS AND DIAPERS) ×2 IMPLANT
YANKAUER SUCT BULB TIP NO VENT (SUCTIONS) IMPLANT

## 2020-10-25 NOTE — Progress Notes (Signed)
Patient off unit  to the OR. 11:30 am patient is currently in room, vss. Drowsy but oriented x4.

## 2020-10-25 NOTE — Brief Op Note (Signed)
10/20/2020 - 10/25/2020  10:20 AM  PATIENT:  [name]  55 y.o. female  PRE-OPERATIVE DIAGNOSIS:  Right foot ulcer  POST-OPERATIVE DIAGNOSIS:  Same  PROCEDURE:  Procedure(s): IRRIGATION AND DEBRIDEMENT ABSCESS OF FOOT AND APPLICATION OF GRAFT (Right)  SURGEON:  Surgeon(s) and Role:    * [name], DPM - Primary  PHYSICIAN ASSISTANT:   ASSISTANTS: none   ANESTHESIA:   general  EBL:  5 cc   BLOOD ADMINISTERED:none  DRAINS: none   LOCAL MEDICATIONS USED:  MARCAINE    and LIDOCAINE   SPECIMEN:  Source of Specimen:  wound culture, skin for pathology  DISPOSITION OF SPECIMEN:  PATHOLOGY  COUNTS:  YES  TOURNIQUET:  * No tourniquets in log *  DICTATION: .Dragon Dictation  PLAN OF CARE: Admit to inpatient   PATIENT DISPOSITION:  PACU - hemodynamically stable.   Delay start of Pharmacological VTE agent (>24hrs) due to surgical blood loss or risk of bleeding: no  Intraoperative findings: No purulence or signs of deeper infection. Excised the ulcer. It did not extend to the bone. After debridement hemostasis achieved. Acell graft applied. Plan to leave the bandage intact for 5 days. Will follow wound culture. Plan for discharge tomorrow with 10 days of oral antibiotics, likely doxy to treat for staph infection based on previous culture.

## 2020-10-25 NOTE — Transfer of Care (Signed)
Immediate Anesthesia Transfer of Care Note  Patient: [name] [name]  Procedure(s) Performed: IRRIGATION AND DEBRIDEMENT ABSCESS OF FOOT AND APPLICATION OF GRAFT (Right: Foot)  Patient Location: PACU  Anesthesia Type:General  Level of Consciousness: awake, alert  and oriented  Airway & Oxygen Therapy: Patient Spontanous Breathing  Post-op Assessment: Report given to RN and Post -op Vital signs reviewed and stable  Post vital signs: Reviewed and stable  Last Vitals:  Vitals Value Taken Time  BP 125/75 10/25/20 1031  Temp    Pulse 68 10/25/20 1033  Resp 13 10/25/20 1033  SpO2 96 % 10/25/20 1033  Vitals shown include unvalidated device data.  Last Pain:  Vitals:   10/25/20 0825  TempSrc: Oral  PainSc:       Patients Stated Pain Goal: 2 (04/04/23 4695)  Complications: No notable events documented.

## 2020-10-25 NOTE — Progress Notes (Signed)
Patient was seen in her hospital room due to [place] and I helped transport the patient to the OR with the CRNA. We discussed the surgery and postop course. Again discussed risks, benefits of surgery. She agrees to proceed. NPO confirmed. Consent signed and foot marked.

## 2020-10-25 NOTE — Anesthesia Preprocedure Evaluation (Addendum)
Anesthesia Evaluation  Patient identified by MRN, date of birth, ID band Patient awake    Reviewed: Allergy & Precautions, H&P , NPO status , Patient's Chart, lab work & pertinent test results  Airway Mallampati: III  TM Distance: >3 FB Neck ROM: Full    Dental no notable dental hx. (+) Teeth Intact, Dental Advisory Given   Pulmonary shortness of breath, asthma , sleep apnea and Continuous Positive Airway Pressure Ventilation ,    Pulmonary exam normal breath sounds clear to auscultation       Cardiovascular hypertension, Pt. on medications and Pt. on home beta blockers  Rhythm:Regular Rate:Normal     Neuro/Psych  Headaches, Anxiety Depression Bipolar Disorder    GI/Hepatic Neg liver ROS, GERD  ,  Endo/Other  Morbid obesity  Renal/GU negative Renal ROS  negative genitourinary   Musculoskeletal  (+) Arthritis , Osteoarthritis,    Abdominal   Peds  Hematology negative hematology ROS (+)   Anesthesia Other Findings   Reproductive/Obstetrics negative OB ROS                            Anesthesia Physical Anesthesia Plan  ASA: 3  Anesthesia Plan: General   Post-op Pain Management:    Induction: Intravenous  PONV Risk Score and Plan: 4 or greater and Ondansetron, Dexamethasone and Midazolam  Airway Management Planned: LMA  Additional Equipment:   Intra-op Plan:   Post-operative Plan: Extubation in OR  Informed Consent: I have reviewed the patients History and Physical, chart, labs and discussed the procedure including the risks, benefits and alternatives for the proposed anesthesia with the patient or authorized representative who has indicated his/her understanding and acceptance.     Dental advisory given  Plan Discussed with: CRNA  Anesthesia Plan Comments:         Anesthesia Quick Evaluation

## 2020-10-25 NOTE — Progress Notes (Signed)
PROGRESS NOTE    [name] [name]  GHW:[id number] DOB: Nov 09, 1965 DOA: 10/20/2020 PCP: [name] Frizzle, MD   Brief Narrative: [name] [name] is a 55 y.o. female with a history of hypertension, GERD, chronic pain, anxiety, migraines. Patient presented secondary to foot pain in setting of ulcer and cellulitis. Podiatry consulted for surgical management; I&D planned.   Assessment & Plan:   Principal Problem:   Open wound of right foot, initial encounter Active Problems:   GERD (gastroesophageal reflux disease)   Depression   Anxiety   Asthma   Essential hypertension   Finger ulcer ([name])   Right metatarsal amputation stump infection Cellulitis of right foot stump Podiatry consulted with plan for surgical management today -Continue Vancomycin and Cefepime IV  COVID-19 infection Incidental finding. Asymptomatic. No evidence of pneumonia. Afebrile. Patient given a three day course of Remdesivir for which she has completed. Continue isolation precautions for a total of 10 days. Discontinue precautions on 7/15 unless treatment changes  Nausea/Vomiting/Diarrhea Improved.  Depression Anxiety -Continue Lexapro, Cymbalta, Buspar, haldal, Topamax  Chronic pain syndrome -Continue oxycodone, flexeril prn  GERD -Continue Protonix  Morbid obesity Body mass index is 40.85 kg/m.   DVT prophylaxis: SCDs Start: 10/21/20 0530  Code Status:   Code Status: Full Code Family Communication: None at bedside Disposition Plan: Discharge home pending podiatry recommendations/management   Consultants:  Podiatry  Procedures:    Antimicrobials: Vancomycin IV Cefepime IV Remdesivir IV    Subjective: Some back pain.  Objective: Vitals:   10/24/20 1724 10/24/20 2110 10/25/20 0348 10/25/20 0825  BP: (!) 154/89 135/83 137/77 (!) 136/91  Pulse: 69 70 61 69  Resp: 16 17 16 18   Temp: 97.9 F (36.6 C) 98.4 F (36.9 C) 98 F (36.7 C) 98.4 F (36.9 C)  TempSrc: Oral Oral  Oral Oral  SpO2: 100% 96% 98% 100%  Weight:      Height:       No intake or output data in the 24 hours ending 10/25/20 0930  Filed Weights   10/20/20 1931  Weight: 108 kg    Examination:  General exam: Appears calm and comfortable Respiratory system: Clear to auscultation. Respiratory effort normal. Cardiovascular system: S1 & S2 heard, RRR. No murmurs, rubs, gallops or clicks. Gastrointestinal system: Abdomen is nondistended, soft and nontender. No organomegaly or masses felt. Normal bowel sounds heard. Central nervous system: Alert and oriented. No focal neurological deficits. Musculoskeletal: No edema. No calf tenderness Skin: No cyanosis. Psychiatry: Judgement and insight appear normal. Mood & affect appropriate.    Data Reviewed: I have personally reviewed following labs and imaging studies  CBC Lab Results  Component Value Date   WBC 6.0 10/24/2020   RBC 3.83 (L) 10/24/2020   HGB 12.2 10/24/2020   HCT 35.6 (L) 10/24/2020   MCV 93.0 10/24/2020   MCH 31.9 10/24/2020   PLT 267 10/24/2020   MCHC 34.3 10/24/2020   RDW 12.6 10/24/2020   LYMPHSABS 2.3 10/24/2020   MONOABS 0.4 10/24/2020   EOSABS 0.1 10/24/2020   BASOSABS 0.0 78/93/8101     Last metabolic panel Lab Results  Component Value Date   NA 137 10/24/2020   K 3.7 10/24/2020   CL 106 10/24/2020   CO2 25 10/24/2020   BUN 8 10/24/2020   CREATININE 0.67 10/24/2020   GLUCOSE 119 (H) 10/24/2020   GFRNONAA >60 10/24/2020   GFRAA 119 06/27/2020   CALCIUM 9.4 10/24/2020   PHOS 3.6 10/21/2020   PROT 6.6 10/24/2020  ALBUMIN 3.4 (L) 10/24/2020   BILITOT 0.5 10/24/2020   ALKPHOS 65 10/24/2020   AST 33 10/24/2020   ALT 21 10/24/2020   ANIONGAP 6 10/24/2020    CBG (last 3)  No results for input(s): GLUCAP in the last 72 hours.   GFR: Estimated Creatinine Clearance: 93.1 mL/min (by C-G formula based on SCr of 0.67 mg/dL).  Coagulation Profile: Recent Labs  Lab 10/21/20 0536  INR 1.0      Recent Results (from the past 240 hour(s))  Culture, blood (routine x 2)     Status: None   Collection Time: 10/20/20 10:14 PM   Specimen: BLOOD  Result Value Ref Range Status   Specimen Description BLOOD LEFT CEPHALIC  Final   Special Requests   Final    BOTTLES DRAWN AEROBIC AND ANAEROBIC Blood Culture adequate volume   Culture   Final    NO GROWTH 5 DAYS Performed at [facility], [address], [place], [place] [address]    Report Status 10/25/2020 FINAL  Final  Culture, blood (routine x 2)     Status: None   Collection Time: 10/20/20 10:14 PM   Specimen: BLOOD LEFT HAND  Result Value Ref Range Status   Specimen Description BLOOD LEFT HAND  Final   Special Requests   Final    AEROBIC BOTTLE ONLY Blood Culture results may not be optimal due to an inadequate volume of blood received in culture bottles   Culture   Final    NO GROWTH 5 DAYS Performed at [facility], [address] [place], [place] [address]    Report Status 10/25/2020 FINAL  Final  Resp Panel by RT-PCR (Flu A&B, Covid) Nasopharyngeal Swab     Status: Abnormal   Collection Time: 10/20/20 11:20 PM   Specimen: Nasopharyngeal Swab; Nasopharyngeal(NP) swabs in vial transport medium  Result Value Ref Range Status   SARS Coronavirus 2 by RT PCR POSITIVE (A) NEGATIVE Final    Comment: RESULT CALLED TO, READ BACK BY AND VERIFIED WITH: K NICHOLS,RN@0049  10/21/20 MKELLY (NOTE) SARS-CoV-2 target nucleic acids are DETECTED.  The SARS-CoV-2 RNA is generally detectable in upper respiratory specimens during the acute phase of infection. Positive results are indicative of the presence of the identified virus, but do not rule out bacterial infection or co-infection with other pathogens not detected by the test. Clinical correlation with patient history and other diagnostic information is necessary to determine patient infection status. The expected result is Negative.  Fact Sheet for  Patients: EntrepreneurPulse.com.au  Fact Sheet for Healthcare Providers: IncredibleEmployment.be  This test is not yet approved or cleared by the [place] FDA and  has been authorized for detection and/or diagnosis of SARS-CoV-2 by FDA under an Emergency Use Authorization (EUA).  This EUA will remain in effect (meaning this test can be  used) for the duration of  the COVID-19 declaration under Section 564(b)(1) of the Act, 21 U.S.C. section 360bbb-3(b)(1), unless the authorization is terminated or revoked sooner.     Influenza A by PCR NEGATIVE NEGATIVE Final   Influenza B by PCR NEGATIVE NEGATIVE Final    Comment: (NOTE) The Xpert Xpress SARS-CoV-2/FLU/RSV plus assay is intended as an aid in the diagnosis of influenza from Nasopharyngeal swab specimens and should not be used as a sole basis for treatment. Nasal washings and aspirates are unacceptable for Xpert Xpress SARS-CoV-2/FLU/RSV testing.  Fact Sheet for Patients: EntrepreneurPulse.com.au  Fact Sheet for Healthcare Providers: IncredibleEmployment.be  This test is not yet approved or cleared by  the [employer] and has been authorized for detection and/or diagnosis of SARS-CoV-2 by FDA under an Emergency Use Authorization (EUA). This EUA will remain in effect (meaning this test can be used) for the duration of the COVID-19 declaration under Section 564(b)(1) of the Act, 21 U.S.C. section 360bbb-3(b)(1), unless the authorization is terminated or revoked.  Performed at [facility], [address] [place], [place] [address]   Surgical pcr screen     Status: Abnormal   Collection Time: 10/24/20  1:02 AM   Specimen: Nasal Mucosa; Nasal Swab  Result Value Ref Range Status   MRSA, PCR NEGATIVE NEGATIVE Final   Staphylococcus aureus POSITIVE (A) NEGATIVE Final    Comment: (NOTE) The Xpert SA Assay (FDA approved for NASAL specimens in patients  79 years of age and older), is one component of a comprehensive surveillance program. It is not intended to diagnose infection nor to guide or monitor treatment. Performed at [facility], [place] [address] [place], [place] [address]          Radiology Studies: No results found.      Scheduled Meds:  busPIRone  15 mg Oral TID   Chlorhexidine Gluconate Cloth  6 each Topical Q0600   DULoxetine  60 mg Oral Daily   escitalopram  10 mg Oral Daily   haloperidol  5 mg Oral QHS   linaclotide  290 mcg Oral QAC breakfast   meloxicam  15 mg Oral QHS   metoprolol succinate  25 mg Oral Daily   mupirocin ointment  1 application Nasal BID   pantoprazole  40 mg Oral BID   pregabalin  100 mg Oral BID   topiramate  50 mg Oral BID   traZODone  100 mg Oral QHS   Continuous Infusions:  vancomycin 1,250 mg (10/24/20 2139)     LOS: 4 days     Cordelia Poche, MD [facility] 10/25/2020, 9:30 AM  If 7PM-7AM, please contact night-coverage [email or web address]

## 2020-10-25 NOTE — Evaluation (Addendum)
Physical Therapy Evaluation Patient Details Name: [name] [name] MRN: [id number] DOB: October 11, 1965 Today's Date: 10/25/2020   History of Present Illness  Pt is a 55 y.o. female who presented 7/4 with R foot pain and ulceration with cellulitis. S/p irrigation and debridement abscess of foot and application of graft 7/9. Incidentally pt with COVID-19 infection. PMH: Hx of R transmetatarsal amputation, hypertension, GERD, athritis, bipolar 1 disorder, pre-diabetes, seizures, chronic pain, anxiety.   Clinical Impression  Pt presents with condition above and deficits mentioned below, see PT Problem List. PTA, she was mod I with use of a SPC and living with her mother and niece in a 1-level house with a level entry. Currently, pt displays generalized weakness, balance deficits, pain in her R foot, and decreased activity tolerance that impacts her independence and safety with all functional mobility. Pt requiring only min guard assist for safety with OOB mobility, displaying good compliance with NWB through her R foot while utilizing a RW and while utilizing a knee scooter. Pt is only able to tolerate ambulating bedroom distances with the RW before she fatigues this date. She could benefit from a knee scooter to permit her to mobilize further within the house and in the community. Will continue to follow acutely. Recommend follow-up with HHPT once cleared to begin placing weight on her R foot again.    Follow Up Recommendations Home health PT;Supervision for mobility/OOB (once cleared by MD for weight bearing)    Equipment Recommendations  3in1 (PT);Wheelchair (measurements PT);Wheelchair cushion (measurements PT);Other (comment) (with elevating leg rests; knee scooter)    Recommendations for Other Services       Precautions / Restrictions Precautions Precautions: Fall Precaution Comments: R transmetatarsal amputation hx; COVID precautions Restrictions Weight Bearing Restrictions: Yes RLE Weight  Bearing: Non weight bearing (R foot)      Mobility  Bed Mobility Overal bed mobility: Independent             General bed mobility comments: Pt able to transition supine > sit R EOB with bed flat and without use of rails.    Transfers Overall transfer level: Needs assistance Equipment used: Rolling walker (2 wheeled) Transfers: Sit to/from Stand Sit to Stand: Min guard         General transfer comment: Min guard assist for safety with sit to stand 1x from EOB and 1x from recliner to RW, cuing pt for hand placement. Educated pt on transfer to/from knee scooter also.  Ambulation/Gait Ambulation/Gait assistance: Min guard Gait Distance (Feet): 40 Feet (x2 bouts of ~15 ft > ~40 ft) Assistive device: Rolling walker (2 wheeled) (knee scooter 2nd bout) Gait Pattern/deviations:  (swing-through) Gait velocity: reduced Gait velocity interpretation: <1.31 ft/sec, indicative of household ambulator General Gait Details: Pt cued to keep R foot off ground to maintain NWB through R foot, good compliance noted. Pt able to take short hops with RW and no LOB, quickly fatigues though. During 2nd bout, pt utilized knee scooter, providing cues to improve upright posture, direct scooter, and use brakes. Min guard for safety throughout  [employer] (Stroke Patients Only)       Balance Overall balance assessment: Needs assistance Sitting-balance support: No upper extremity supported;Feet supported Sitting balance-Leahy Scale: Good     Standing balance support: Single extremity supported;Bilateral upper extremity supported Standing balance-Leahy Scale: Poor Standing balance comment: Reliant on UE support.  Pertinent Vitals/Pain Pain Assessment: 0-10 Pain Score: 4  Pain Location: R foot Pain Descriptors / Indicators: Discomfort;Grimacing;Guarding;Operative site guarding Pain Intervention(s):  Limited activity within patient's tolerance;Monitored during session;Repositioned    Home Living Family/patient expects to be discharged to:: Private residence Living Arrangements: Parent;Other relatives (mother and niece) Available Help at Discharge: Family;Available 24 hours/day Type of Home: House Home Access: Level entry     Home Layout: One level Home Equipment: Walker - 2 wheels;Cane - single point;Crutches      Prior Function Level of Independence: Independent with assistive device(s)         Comments: Uses SPC. Manages own finances, meds, etc.     Hand Dominance   Dominant Hand: Right    Extremity/Trunk Assessment   Upper Extremity Assessment Upper Extremity Assessment: Overall WFL for tasks assessed    Lower Extremity Assessment Lower Extremity Assessment: RLE deficits/detail;Generalized weakness RLE Deficits / Details: Hx of transmetatarsal amputation and wound currently at foot with ACE bandage applied; reports decreased sensation at bil feet RLE Sensation: decreased light touch;history of peripheral neuropathy (at foot)    Cervical / Trunk Assessment Cervical / Trunk Assessment: Normal  Communication   Communication: No difficulties  Cognition Arousal/Alertness: Awake/alert Behavior During Therapy: WFL for tasks assessed/performed Overall Cognitive Status: Within Functional Limits for tasks assessed                                        General Comments      Exercises     Assessment/Plan    PT Assessment Patient needs continued PT services  PT Problem List Decreased strength;Decreased range of motion;Decreased activity tolerance;Decreased balance;Decreased mobility;Decreased coordination;Decreased safety awareness;Decreased knowledge of use of DME;Decreased knowledge of precautions;Impaired sensation;Pain;Decreased skin integrity       PT Treatment Interventions DME instruction;Gait training;Functional mobility  training;Therapeutic activities;Therapeutic exercise;Balance training;Neuromuscular re-education;Patient/family education;Wheelchair mobility training    PT Goals (Current goals can be found in the Care Plan section)  Acute Rehab PT Goals Patient Stated Goal: to go home PT Goal Formulation: With patient Time For Goal Achievement: 11/08/20 Potential to Achieve Goals: Good    Frequency Min 3X/week   Barriers to discharge        Co-evaluation               AM-PAC PT "6 Clicks" Mobility  Outcome Measure Help needed turning from your back to your side while in a flat bed without using bedrails?: None Help needed moving from lying on your back to sitting on the side of a flat bed without using bedrails?: None Help needed moving to and from a bed to a chair (including a wheelchair)?: A Little Help needed standing up from a chair using your arms (e.g., wheelchair or bedside chair)?: A Little Help needed to walk in hospital room?: A Little Help needed climbing 3-5 steps with a railing? : A Little 6 Click Score: 20    End of Session Equipment Utilized During Treatment: Gait belt;Other (comment) (knee scooter) Activity Tolerance: Patient tolerated treatment well;Patient limited by fatigue Patient left: in chair;with call bell/phone within reach Nurse Communication: Mobility status PT Visit Diagnosis: Unsteadiness on feet (R26.81);Other abnormalities of gait and mobility (R26.89);Muscle weakness (generalized) (M62.81);Difficulty in walking, not elsewhere classified (R26.2);Pain Pain - Right/Left: Right Pain - part of body: Ankle and joints of foot    Time: 2841-3244 PT Time Calculation (min) (ACUTE ONLY): 21 min  Charges:   PT Evaluation $PT Eval Moderate Complexity: 1 Mod          Moishe Spice, PT, DPT Acute Rehabilitation Services  Pager: [phone] Office: [phone]   Orvan Falconer 10/25/2020, 3:30 PM

## 2020-10-25 NOTE — Anesthesia Procedure Notes (Signed)
Procedure Name: LMA Insertion Date/Time: 10/25/2020 9:42 AM Performed by: Eligha Bridegroom, CRNA Pre-anesthesia Checklist: Patient identified, Emergency Drugs available, Suction available, Patient being monitored and Timeout performed Patient Re-evaluated:Patient Re-evaluated prior to induction Oxygen Delivery Method: Circle system utilized Preoxygenation: Pre-oxygenation with 100% oxygen Induction Type: IV induction LMA: LMA inserted LMA Size: 4.0 Number of attempts: 1 Placement Confirmation: breath sounds checked- equal and bilateral and positive ETCO2 Tube secured with: Tape Dental Injury: Teeth and Oropharynx as per pre-operative assessment

## 2020-10-25 NOTE — Anesthesia Postprocedure Evaluation (Signed)
Anesthesia Post Note  Patient: [name] [name]  Procedure(s) Performed: IRRIGATION AND DEBRIDEMENT ABSCESS OF FOOT AND APPLICATION OF GRAFT (Right: Foot)     Patient location during evaluation: PACU Anesthesia Type: General Level of consciousness: awake and alert Pain management: pain level controlled Vital Signs Assessment: post-procedure vital signs reviewed and stable Respiratory status: spontaneous breathing, nonlabored ventilation and respiratory function stable Cardiovascular status: blood pressure returned to baseline and stable Postop Assessment: no apparent nausea or vomiting Anesthetic complications: no   No notable events documented.  Last Vitals:  Vitals:   10/25/20 1045 10/25/20 1100  BP: 132/71 120/65  Pulse: 62 65  Resp: 14 13  Temp:  (!) 36.4 C  SpO2: 99% 95%    Last Pain:  Vitals:   10/25/20 1100  TempSrc:   PainSc: 0-No pain                 Zyliah Schier,W. EDMOND

## 2020-10-26 LAB — MAGNESIUM: Magnesium: 1.9 mg/dL (ref 1.7–2.4)

## 2020-10-26 MED ORDER — CEFADROXIL 500 MG PO CAPS: 500.0000 mg | ORAL_CAPSULE | Freq: Two times a day (BID) | ORAL | Status: DC

## 2020-10-26 MED ORDER — CEFADROXIL 500 MG PO CAPS: 1000.0000 mg | ORAL_CAPSULE | Freq: Two times a day (BID) | ORAL | Status: DC

## 2020-10-26 MED ORDER — CEFADROXIL 500 MG PO CAPS: 500.0000 mg | ORAL_CAPSULE | Freq: Two times a day (BID) | ORAL | 0 refills | Status: AC

## 2020-10-26 MED ORDER — CEFDINIR 300 MG PO CAPS
300.0000 mg | ORAL_CAPSULE | Freq: Two times a day (BID) | ORAL | Status: DC
  Filled 2020-10-26: qty 1

## 2020-10-26 NOTE — Discharge Instructions (Signed)
[name] [name],  You were in the hospital with a foot infection. Dr. Jacqualyn Posey took you to the OR to clean out the wound. Please continue your antibiotics and follow-up with him on Wednesday.

## 2020-10-26 NOTE — Op Note (Signed)
PATIENT:  [name] [name]  55 y.o. female   PRE-OPERATIVE DIAGNOSIS:  Right foot ulcer   POST-OPERATIVE DIAGNOSIS:  Same   PROCEDURE:  Procedure(s): IRRIGATION AND DEBRIDEMENT ABSCESS OF FOOT AND APPLICATION OF GRAFT (Right)   SURGEON:  Surgeon(s) and Role:    * [name], DPM - Primary   PHYSICIAN ASSISTANT:   ASSISTANTS: none    ANESTHESIA:   general   EBL:  5 cc    BLOOD ADMINISTERED:none   DRAINS: none    LOCAL MEDICATIONS USED:  MARCAINE    and LIDOCAINE   SPECIMEN:  Source of Specimen:  wound culture, skin for pathology   DISPOSITION OF SPECIMEN:  PATHOLOGY   COUNTS:  YES   TOURNIQUET:  * No tourniquets in log *   DICTATION: .Dragon Dictation   PLAN OF CARE: Admit to inpatient    PATIENT DISPOSITION:  PACU - hemodynamically stable.   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  She presented with a transmetatarsal amputation and developed a wound.  She had a gastrocnemius recession.  She was then referred to wound care and the wound did eventually heal however she states the wound opened back up.  She is been doing local wound care but then she noticed the wound had worsened and increased swelling and redness.  She was initially admitted to [facility].  MRI was concerning for developing abscess and had her transferred to [facility] for evaluation.  Given the nature of the wound as well as MRI findings recommend incision and drainage and debridement of the wound and possible graft application.  I discussed with her the surgery as well as postoperative course.  Alternatives risks and complications were discussed.  No promises or guarantees given.  All questions answered.  Incidentally she was COVID-positive  Procedure in detail: The patient was both verbally and is identified by myself in her  hospital room.  Due to her COVID status she was transferred directly to the operating room by myself as well as the CRNA.  We were met by the circulating nurse.  Consent was verified and signed and the site was marked.  LMA was placed by anesthesia.  The right lower extremities and scrubbed, prepped, draped in normal sterile fashion.  Timeout was performed.  10 cc mixture of lidocaine, Marcaine plain was infiltrated in a regional block fashion on the surgical site.  At this time the wound was evaluated on the plantar medial aspect of the foot and initially the wound measured 1 x 1 x 0.5 cm.  There is no probing to bone.  I utilized a 15 with scalpel to circumferentially excise the wound.  This was down through the fat layer.  I debrided all nonviable devitalized tissue utilizing a 15 blade scalpel.  There is no proximal tracking.  There is no purulence identified there is no probing to bone.  I did take a wound culture.  After debridement the wound measured 1.3 x 1.1 x 1.5 cm.  Hemostasis was achieved and the wound was copiously irrigated with saline.  I utilized 500 mL of ACell micro matrix as well as a 5 x 5 Cytal graft.  I utilized 2 x 2 cm with graft and the rest was wasted.  I used the entire 500 mL of the micro matrix.  The graft was secured  in place by staples.  Adaptic was applied followed by surgical lube, 4 x 4's, ABD pad, Kerlix, Ace bandage.  She was woken anesthesia and found to tolerate the procedure well and complications.  She was transferred to PACU with vital signs stable and vascular status intact.  She is remained inpatient for 24 hours without antibiotics and likely discharge home tomorrow.  She is nonweightbearing.  We will plan on changing the bandage in 5 days post surgery.

## 2020-10-26 NOTE — Discharge Summary (Signed)
Physician Discharge Summary  [name] [name]:[id number] DOB: 10-02-1965 DOA: 10/20/2020  PCP: [name] Frizzle, MD  Admit date: 10/20/2020 Discharge date: 10/26/2020  Admitted From: Home Disposition: Home  Recommendations for Outpatient Follow-up:  Follow up with PCP in 1 week Follow up with podiatry in three days Please obtain BMP/CBC in one week Please follow up on the following pending results: Wound culture  Home Health: PT Equipment/Devices: Wheelchair, 3 in 1, Scooter  Discharge Condition: Stable CODE STATUS: Full code Diet recommendation: Regular diet   Brief/Interim Summary:  Admission HPI written by Bernadette Hoit, DO  HPI: [name] [name] is a 55 y.o. female with medical history significant for GERD, hypertension, depression, bipolar who presents to the emergency department due to right foot pain and ulceration.  Patient has right transmetatarsal amputated foot and she is complaining of a chronic wound to her right foot which presented with worsening pain while walking today.  She noted sloughing of of a large piece of skin from the bottom of her foot and a pool of blood in her shoes.  She reported increased pain over the past several days due to purulent draining ulcer.  Patient follows with a podiatrist and wound clinic, she denies fever, chills, chest pain, shortness of breath, nausea or vomiting.  Patient also complaining of 2-week onset of right middle finger wound which occurred due to biting the finger.  Patient was diagnosed with COVID about 4 to 5 days ago.  She ambulates with a cane at baseline.   Hospital course:  Right metatarsal amputation stump infection Cellulitis of right foot stump Podiatry consulted with plan for surgical management today. Patient was managed empirically on Vancomycin and Cefepime. She underwent irrigation and debridement of the abscess in her foot by podiatry. Recommendation to continue antibiotics for 10 more days; patient was  transitioned to cefadroxil 500 mg BID on discharge.    COVID-19 infection Incidental finding. Asymptomatic. No evidence of pneumonia. Afebrile. Patient given a three day course of Remdesivir for which she has completed. Continue isolation precautions for a total of 10 days. Isolation until 7/15 unless treatment changes. Discussed with patient.   Nausea/Vomiting/Diarrhea Resolved.   Depression Anxiety Continue Lexapro, Cymbalta, Buspar, haldal, Topamax   Chronic pain syndrome Continue oxycodone   GERD Continue Protonix   Morbid obesity Body mass index is 40.85 kg/m.   Discharge Diagnoses:  Principal Problem:   Open wound of right foot, initial encounter Active Problems:   GERD (gastroesophageal reflux disease)   Depression   Anxiety   Asthma   Essential hypertension   Finger ulcer ([place])    Discharge Instructions   Allergies as of 10/26/2020       Reactions   Other    Penicillins Hives, Rash   No anaphylactic or severe cutaneous Sx > 10 yr ago, no additional medical attention required Tolerates Keflex and Rocephin   Tetracyclines & Related Other (See Comments)   Syncope and put her "in a coma"   Tramadol Other (See Comments)   Seizures   Phenazopyridine Other (See Comments)   Unknown   Tetracycline Other (See Comments)   Syncope and "Put me in a coma"   Ciprofloxacin Rash, Itching   Codeine Itching, Rash   Estradiol Rash   Patches broke out the skin        Medication List     TAKE these medications    Aimovig 70 MG/ML Soaj Generic drug: Erenumab-aooe Inject 70 mg into the skin every 30 (  thirty) days.   albuterol 108 (90 Base) MCG/ACT inhaler Commonly known as: ProAir HFA Inhale 2 puffs = 162mcg into the lungs every 6 (six) hours as needed for wheezing or shortness of breath. What changed:  how much to take how to take this when to take this reasons to take this additional instructions   busPIRone 15 MG tablet Commonly known as:  BUSPAR Take 15 mg by mouth 3 (three) times daily.   CALCIUM 1200 PO Take 1,200 mg by mouth daily.   cefadroxil 500 MG capsule Commonly known as: DURICEF Take 1 capsule (500 mg total) by mouth every 12 (twelve) hours for 10 days.   cyclobenzaprine 10 MG tablet Commonly known as: FLEXERIL Take 10 mg by mouth 2 (two) times daily as needed for muscle spasms.   DULoxetine 60 MG capsule Commonly known as: CYMBALTA TAKE 1 CAPSULE BY MOUTH EVERY DAY What changed: how much to take   escitalopram 10 MG tablet Commonly known as: LEXAPRO Take 10 mg by mouth daily.   estradiol 1 MG tablet Commonly known as: ESTRACE TAKE 1 TABLET AT BEDTIME What changed: when to take this   fluticasone 50 MCG/ACT nasal spray Commonly known as: FLONASE SPRAY 2 SPRAYS INTO EACH NOSTRIL EVERY DAY What changed: See the new instructions.   haloperidol 5 MG tablet Commonly known as: HALDOL Take 1 tablet (5 mg total) by mouth at bedtime. For psychosis   Linzess 290 MCG Caps capsule Generic drug: linaclotide TAKE 1 CAPSULE BY MOUTH EVERY DAY IN THE MORNING BEFORE BREAKFAST What changed: See the new instructions.   meloxicam 15 MG tablet Commonly known as: MOBIC Take 15 mg by mouth at bedtime.   metoprolol succinate 25 MG 24 hr tablet Commonly known as: TOPROL-XL TAKE 1 TABLET(25 MG) BY MOUTH DAILY What changed: See the new instructions.   multivitamin with minerals tablet Take 1 tablet by mouth daily.   OVER THE COUNTER MEDICATION Fennel Seed   oxyCODONE-acetaminophen 7.5-325 MG tablet Commonly known as: PERCOCET Take 1 tablet by mouth 5 (five) times daily as needed for severe pain.   pantoprazole 40 MG tablet Commonly known as: PROTONIX TAKE 1 TABLET BY MOUTH TWICE A DAY What changed: when to take this   potassium chloride SA 20 MEQ tablet Commonly known as: KLOR-CON TAKE 2 TABLET BY MOUTH EVERY DAY What changed:  how much to take how to take this when to take this additional  instructions   pregabalin 100 MG capsule Commonly known as: LYRICA Take 100 mg by mouth 2 (two) times daily.   SUMAtriptan 50 MG tablet Commonly known as: IMITREX TAKE 1 TAB EVERY 2 HOURS AS NEEDED FOR MIGRAINE. MAY REPEAT IN 2 HOURS IF PERSISTS OR RECURS What changed: See the new instructions.   topiramate 50 MG tablet Commonly known as: TOPAMAX Take 1 tablet (50 mg total) by mouth 2 (two) times daily.   traZODone 100 MG tablet Commonly known as: DESYREL Take 100 mg by mouth at bedtime.               Durable Medical Equipment  (From admission, onward)           Start     Ordered   10/26/20 1347  For home use only DME Other see comment  Once       Comments: Knee scooter  Question:  Length of Need  Answer:  6 Months   10/26/20 1347   10/26/20 1329  For home use only DME 3 n  1  Once        10/26/20 1328   10/26/20 1328  For home use only DME lightweight manual wheelchair with seat cushion  Once       Comments: Patient suffers from wound to right foot which impairs their ability to perform daily activities like bathing, dressing, and toileting in the home.  A walker will not resolve  issue with performing activities of daily living. A wheelchair will allow patient to safely perform daily activities. Patient is not able to propel themselves in the home using a standard weight wheelchair due to general weakness. Patient can self propel in the lightweight wheelchair. Length of need 6 months . Accessories: elevating leg rests (ELRs), wheel locks, extensions and anti-tippers.   10/26/20 1328            Follow-up Information     [name], DPM. Schedule an appointment as soon as possible for a visit on 10/29/2020.   Specialty: Podiatry Why: For hospital follow-up, For wound re-check Contact information: [address] [place] [address] [phone]         [name] Frizzle, MD Follow up in 1 week(s).   Specialty: Family  Medicine Why: For hospital follow-up Contact information: [address] [place] [address] [phone]         [facility] Follow up.   Specialty: Home Health Services Why: the office will call to schedule home health visits Contact information: [address] [place] [address] [phone]                Allergies  Allergen Reactions   Other    Penicillins Hives and Rash    No anaphylactic or severe cutaneous Sx > 10 yr ago, no additional medical attention required Tolerates Keflex and Rocephin   Tetracyclines & Related Other (See Comments)    Syncope and put her "in a coma"   Tramadol Other (See Comments)    Seizures   Phenazopyridine Other (See Comments)    Unknown   Tetracycline Other (See Comments)    Syncope and "Put me in a coma"   Ciprofloxacin Rash and Itching   Codeine Itching and Rash   Estradiol Rash    Patches broke out the skin    Consultations: Podiatry   Procedures/Studies: MR FOOT RIGHT WO CONTRAST  Result Date: 10/22/2020 CLINICAL DATA:  Right foot stump infection. EXAM: MRI OF THE RIGHT FOREFOOT WITHOUT CONTRAST TECHNIQUE: Multiplanar, multisequence MR imaging of the right but was performed. No intravenous contrast was administered. COMPARISON:  Right foot x-rays dated October 20, 2020. MRI right foot dated May 16, 2019. FINDINGS: Bones/Joint/Cartilage Prior transmetatarsal amputation. No marrow signal abnormality. No fracture or dislocation. Joint spaces are preserved. Small posterior subtalar joint effusion. Ligaments Medial and lateral ankle ligaments are intact. Muscles and Tendons Split tear of the peroneal brevis tendon. Remaining tendons are intact. Soft tissue Soft tissue swelling of the amputation stump with small ulceration along the medial aspect. Suspected developing fluid collection measuring 1.8 x 1.9 x 2.0 cm (series 4, image 22) draining to the ulcer. IMPRESSION: 1. Prior transmetatarsal  amputation. Soft tissue swelling of the amputation stump with small ulceration along the medial aspect. Suspected developing 1.8 x 1.9 x 2.0 cm abscess draining to the ulcer. 2. No evidence of osteomyelitis. 3. Split tear of the peroneal brevis tendon. Electronically Signed   By: Titus Dubin M.D.   On: 10/22/2020 15:27  DG Finger Middle Right  Result Date: 10/20/2020 CLINICAL DATA:  Wound to the third digit. Wound in the distal aspect for 2 weeks. EXAM: RIGHT MIDDLE FINGER 2+V COMPARISON:  None. FINDINGS: There is no evidence of fracture or dislocation. No erosion, periosteal reaction or bone destruction. 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 Skin irregularity of the distal digit. No tracking soft tissue air. No radiopaque foreign body. IMPRESSION: Soft tissue irregularity of the distal digit. No radiopaque foreign body or acute osseous abnormality. Osteoarthritis. Electronically Signed   By: Keith Rake M.D.   On: 10/20/2020 23:43   DG Foot Complete Right  Result Date: 10/20/2020 CLINICAL DATA:  Foot ulcer with concern for infection. EXAM: RIGHT FOOT COMPLETE - 3+ VIEW COMPARISON:  Most recent radiograph 05/18/2019 FINDINGS: Five ray transmetatarsal amputation. Resection margins are smooth. There is no erosion, bone destruction or periosteal reaction. Mild soft tissue prominence about the distal stump with questionable ulcer overlying the region of the second metatarsal. No tracking soft tissue air. No radiopaque foreign body. No fracture. Achilles tendon enthesophyte and plantar fascial calcifications again seen. IMPRESSION: 1. Previous transmetatarsal amputation without radiographic findings of osteomyelitis. 2. Soft tissue prominence about the distal stump with questionable ulcer overlying the region of the second metatarsal. Electronically Signed   By: Keith Rake M.D.   On: 10/20/2020 22:25     Subjective: Feels well today. No concerns. No chest  pain or dyspnea.  Discharge Exam: Vitals:   10/26/20 0525 10/26/20 0853  BP: (!) 106/57 132/88  Pulse: 77 69  Resp: 18 20  Temp: 97.9 F (36.6 C) 98 F (36.7 C)  SpO2: 97% 97%   Vitals:   10/25/20 2105 10/26/20 0119 10/26/20 0525 10/26/20 0853  BP: (!) 143/86 (!) 111/52 (!) 106/57 132/88  Pulse: 77 72 77 69  Resp: 18 18 18 20   Temp: 97.9 F (36.6 C) 98.4 F (36.9 C) 97.9 F (36.6 C) 98 F (36.7 C)  TempSrc: Oral Oral Oral Oral  SpO2: 99% 98% 97% 97%  Weight:      Height:        General: Pt is alert, awake, not in acute distress Cardiovascular: RRR, S1/S2 +, no rubs, no gallops Respiratory: CTA bilaterally, no wheezing, no rhonchi Abdominal: Soft, NT, ND, bowel sounds + Extremities: no edema, no cyanosis    The results of significant diagnostics from this hospitalization (including imaging, microbiology, ancillary and laboratory) are listed below for reference.     Microbiology: Recent Results (from the past 240 hour(s))  Culture, blood (routine x 2)     Status: None   Collection Time: 10/20/20 10:14 PM   Specimen: BLOOD  Result Value Ref Range Status   Specimen Description BLOOD LEFT CEPHALIC  Final   Special Requests   Final    BOTTLES DRAWN AEROBIC AND ANAEROBIC Blood Culture adequate volume   Culture   Final    NO GROWTH 5 DAYS Performed at [facility], [address] [place], [place] [address]    Report Status 10/25/2020 FINAL  Final  Culture, blood (routine x 2)     Status: None   Collection Time: 10/20/20 10:14 PM   Specimen: BLOOD LEFT HAND  Result Value Ref Range Status   Specimen Description BLOOD LEFT HAND  Final   Special Requests   Final    AEROBIC BOTTLE ONLY Blood Culture results may not be optimal due to an inadequate volume of blood received in culture bottles   Culture  Final    NO GROWTH 5 DAYS Performed at [facility], [address] [place], [place] [address]    Report Status 10/25/2020 FINAL  Final  Resp Panel by RT-PCR (Flu  A&B, Covid) Nasopharyngeal Swab     Status: Abnormal   Collection Time: 10/20/20 11:20 PM   Specimen: Nasopharyngeal Swab; Nasopharyngeal(NP) swabs in vial transport medium  Result Value Ref Range Status   SARS Coronavirus 2 by RT PCR POSITIVE (A) NEGATIVE Final    Comment: RESULT CALLED TO, READ BACK BY AND VERIFIED WITH: K NICHOLS,RN@0049  10/21/20 MKELLY (NOTE) SARS-CoV-2 target nucleic acids are DETECTED.  The SARS-CoV-2 RNA is generally detectable in upper respiratory specimens during the acute phase of infection. Positive results are indicative of the presence of the identified virus, but do not rule out bacterial infection or co-infection with other pathogens not detected by the test. Clinical correlation with patient history and other diagnostic information is necessary to determine patient infection status. The expected result is Negative.  Fact Sheet for Patients: EntrepreneurPulse.com.au  Fact Sheet for Healthcare Providers: IncredibleEmployment.be  This test is not yet approved or cleared by the [place] FDA and  has been authorized for detection and/or diagnosis of SARS-CoV-2 by FDA under an Emergency Use Authorization (EUA).  This EUA will remain in effect (meaning this test can be  used) for the duration of  the COVID-19 declaration under Section 564(b)(1) of the Act, 21 U.S.C. section 360bbb-3(b)(1), unless the authorization is terminated or revoked sooner.     Influenza A by PCR NEGATIVE NEGATIVE Final   Influenza B by PCR NEGATIVE NEGATIVE Final    Comment: (NOTE) The Xpert Xpress SARS-CoV-2/FLU/RSV plus assay is intended as an aid in the diagnosis of influenza from Nasopharyngeal swab specimens and should not be used as a sole basis for treatment. Nasal washings and aspirates are unacceptable for Xpert Xpress SARS-CoV-2/FLU/RSV testing.  Fact Sheet for Patients: EntrepreneurPulse.com.au  Fact Sheet  for Healthcare Providers: IncredibleEmployment.be  This test is not yet approved or cleared by the [place] FDA and has been authorized for detection and/or diagnosis of SARS-CoV-2 by FDA under an Emergency Use Authorization (EUA). This EUA will remain in effect (meaning this test can be used) for the duration of the COVID-19 declaration under Section 564(b)(1) of the Act, 21 U.S.C. section 360bbb-3(b)(1), unless the authorization is terminated or revoked.  Performed at [facility], [address] [place], [place] [address]   Surgical pcr screen     Status: Abnormal   Collection Time: 10/24/20  1:02 AM   Specimen: Nasal Mucosa; Nasal Swab  Result Value Ref Range Status   MRSA, PCR NEGATIVE NEGATIVE Final   Staphylococcus aureus POSITIVE (A) NEGATIVE Final    Comment: (NOTE) The Xpert SA Assay (FDA approved for NASAL specimens in patients 83 years of age and older), is one component of a comprehensive surveillance program. It is not intended to diagnose infection nor to guide or monitor treatment. Performed at [facility], [place] [address], [place], [place] [address]   Aerobic/Anaerobic Culture w Gram Stain (surgical/deep wound)     Status: None (Preliminary result)   Collection Time: 10/25/20 10:00 AM   Specimen: Wound  Result Value Ref Range Status   Specimen Description WOUND RIGHT FOOT SPEC A  Final   Special Requests RIGHT FOOT  Final   Gram Stain PENDING  Incomplete   Culture   Final    NO GROWTH 1 DAY Performed at [facility],  [address], [place], [place] [address]    Report Status PENDING  Incomplete     Labs: BNP (last 3 results) No results for input(s): BNP in the last 8760 hours. Basic Metabolic Panel: Recent Labs  Lab 10/20/20 2139 10/21/20 0536 10/24/20 0719 10/26/20 0542  NA 135 135 137  --   K 3.6 3.7 3.7  --   CL 104 104 106  --   CO2 24 22 25   --   GLUCOSE 103* 104* 119*  --   BUN 12 13 8   --   CREATININE  0.66 0.59 0.67  --   CALCIUM 8.8* 8.4* 9.4  --   MG  --  1.8 2.0 1.9  PHOS  --  3.6  --   --    Liver Function Tests: Recent Labs  Lab 10/20/20 2139 10/21/20 0536 10/24/20 0719  AST 17 15 33  ALT 14 13 21   ALKPHOS 77 66 65  BILITOT 0.3 0.4 0.5  PROT 6.7 6.2* 6.6  ALBUMIN 3.4* 3.1* 3.4*   No results for input(s): LIPASE, AMYLASE in the last 168 hours. No results for input(s): AMMONIA in the last 168 hours. CBC: Recent Labs  Lab 10/20/20 2139 10/21/20 0536 10/24/20 0719  WBC 7.5 7.5 6.0  NEUTROABS 4.6  --  3.2  HGB 12.3 11.1* 12.2  HCT 36.3 32.7* 35.6*  MCV 95.5 95.6 93.0  PLT 243 226 267   Cardiac Enzymes: No results for input(s): CKTOTAL, CKMB, CKMBINDEX, TROPONINI in the last 168 hours. BNP: Invalid input(s): POCBNP CBG: No results for input(s): GLUCAP in the last 168 hours. D-Dimer No results for input(s): DDIMER in the last 72 hours. Hgb A1c No results for input(s): HGBA1C in the last 72 hours. Lipid Profile No results for input(s): CHOL, HDL, LDLCALC, TRIG, CHOLHDL, LDLDIRECT in the last 72 hours. Thyroid function studies No results for input(s): TSH, T4TOTAL, T3FREE, THYROIDAB in the last 72 hours.  Invalid input(s): FREET3 Anemia work up No results for input(s): VITAMINB12, FOLATE, FERRITIN, TIBC, IRON, RETICCTPCT in the last 72 hours. Urinalysis    Component Value Date/Time   COLORURINE YELLOW (A) 08/26/2019 0227   APPEARANCEUR CLEAR (A) 08/26/2019 0227   LABSPEC 1.013 08/26/2019 0227   PHURINE 7.0 08/26/2019 0227   GLUCOSEU NEGATIVE 08/26/2019 0227   HGBUR NEGATIVE 08/26/2019 0227   BILIRUBINUR NEGATIVE 08/26/2019 0227   KETONESUR NEGATIVE 08/26/2019 0227   PROTEINUR NEGATIVE 08/26/2019 0227   UROBILINOGEN 0.2 06/23/2017 1108   NITRITE NEGATIVE 08/26/2019 0227   LEUKOCYTESUR NEGATIVE 08/26/2019 0227   Sepsis Labs Invalid input(s): PROCALCITONIN,  WBC,  LACTICIDVEN Microbiology Recent Results (from the past 240 hour(s))  Culture, blood  (routine x 2)     Status: None   Collection Time: 10/20/20 10:14 PM   Specimen: BLOOD  Result Value Ref Range Status   Specimen Description BLOOD LEFT CEPHALIC  Final   Special Requests   Final    BOTTLES DRAWN AEROBIC AND ANAEROBIC Blood Culture adequate volume   Culture   Final    NO GROWTH 5 DAYS Performed at [facility], [address], [place], [place] [address]    Report Status 10/25/2020 FINAL  Final  Culture, blood (routine x 2)     Status: None   Collection Time: 10/20/20 10:14 PM   Specimen: BLOOD LEFT HAND  Result Value Ref Range Status   Specimen Description BLOOD LEFT HAND  Final   Special Requests   Final    AEROBIC BOTTLE ONLY Blood  Culture results may not be optimal due to an inadequate volume of blood received in culture bottles   Culture   Final    NO GROWTH 5 DAYS Performed at [facility], [address], [place], [place] [address]    Report Status 10/25/2020 FINAL  Final  Resp Panel by RT-PCR (Flu A&B, Covid) Nasopharyngeal Swab     Status: Abnormal   Collection Time: 10/20/20 11:20 PM   Specimen: Nasopharyngeal Swab; Nasopharyngeal(NP) swabs in vial transport medium  Result Value Ref Range Status   SARS Coronavirus 2 by RT PCR POSITIVE (A) NEGATIVE Final    Comment: RESULT CALLED TO, READ BACK BY AND VERIFIED WITH: K NICHOLS,RN@0049  10/21/20 MKELLY (NOTE) SARS-CoV-2 target nucleic acids are DETECTED.  The SARS-CoV-2 RNA is generally detectable in upper respiratory specimens during the acute phase of infection. Positive results are indicative of the presence of the identified virus, but do not rule out bacterial infection or co-infection with other pathogens not detected by the test. Clinical correlation with patient history and other diagnostic information is necessary to determine patient infection status. The expected result is Negative.  Fact Sheet for Patients: EntrepreneurPulse.com.au  Fact Sheet for Healthcare  Providers: IncredibleEmployment.be  This test is not yet approved or cleared by the [place] FDA and  has been authorized for detection and/or diagnosis of SARS-CoV-2 by FDA under an Emergency Use Authorization (EUA).  This EUA will remain in effect (meaning this test can be  used) for the duration of  the COVID-19 declaration under Section 564(b)(1) of the Act, 21 U.S.C. section 360bbb-3(b)(1), unless the authorization is terminated or revoked sooner.     Influenza A by PCR NEGATIVE NEGATIVE Final   Influenza B by PCR NEGATIVE NEGATIVE Final    Comment: (NOTE) The Xpert Xpress SARS-CoV-2/FLU/RSV plus assay is intended as an aid in the diagnosis of influenza from Nasopharyngeal swab specimens and should not be used as a sole basis for treatment. Nasal washings and aspirates are unacceptable for Xpert Xpress SARS-CoV-2/FLU/RSV testing.  Fact Sheet for Patients: EntrepreneurPulse.com.au  Fact Sheet for Healthcare Providers: IncredibleEmployment.be  This test is not yet approved or cleared by the [place] FDA and has been authorized for detection and/or diagnosis of SARS-CoV-2 by FDA under an Emergency Use Authorization (EUA). This EUA will remain in effect (meaning this test can be used) for the duration of the COVID-19 declaration under Section 564(b)(1) of the Act, 21 U.S.C. section 360bbb-3(b)(1), unless the authorization is terminated or revoked.  Performed at [facility], [address], [place], [place] [address]   Surgical pcr screen     Status: Abnormal   Collection Time: 10/24/20  1:02 AM   Specimen: Nasal Mucosa; Nasal Swab  Result Value Ref Range Status   MRSA, PCR NEGATIVE NEGATIVE Final   Staphylococcus aureus POSITIVE (A) NEGATIVE Final    Comment: (NOTE) The Xpert SA Assay (FDA approved for NASAL specimens in patients 98 years of age and older), is one component of a comprehensive surveillance  program. It is not intended to diagnose infection nor to guide or monitor treatment. Performed at [facility], [place] [address] [place], [place] [address]   Aerobic/Anaerobic Culture w Gram Stain (surgical/deep wound)     Status: None (Preliminary result)   Collection Time: 10/25/20 10:00 AM   Specimen: Wound  Result Value Ref Range Status   Specimen Description WOUND RIGHT FOOT SPEC A  Final   Special Requests RIGHT FOOT  Final   Gram Stain  PENDING  Incomplete   Culture   Final    NO GROWTH 1 DAY Performed at [facility], [place] [address] [place],  [address]    Report Status PENDING  Incomplete     Time coordinating discharge: 35 minutes  SIGNED:   Cordelia Poche, MD [facility] 10/26/2020, 3:04 PM

## 2020-10-26 NOTE — Progress Notes (Signed)
Orthopedic Tech Progress Note Patient Details:  [name] [name] 05/26/1965 [id number]  Ortho Devices Type of Ortho Device: CAM walker Ortho Device/Splint Location: Right Foot Ortho Device/Splint Interventions: Application   Post Interventions Patient Tolerated: Well Instructions Provided: Adjustment of device  [name] [name] 10/26/2020, 10:27 AM

## 2020-10-26 NOTE — TOC Transition Note (Signed)
Transition of Care [facility]) - CM/SW Discharge Note   Patient Details  Name: [name] [name] MRN: [id number] Date of Birth: September 23, 1965  Transition of Care [facility]) CM/SW Contact:  Bartholomew Crews, RN Phone Number: [phone] 10/26/2020, 3:04 PM   Clinical Narrative:     Spoke with patient on her room phone to discuss transition planning. Patient to transition home today. Discussed home health PT recommendations. Offered agency choice [facility] accepted referral. Discussed DME recommendations. Referral to [employer] for 3N1, wheelchair, and knee scooter. 3N1 and wheelchair delivered to the room. Discussed with patient that order is in for knee scooter but currently unavailable at the hospital. Advised that she can go to the retail store this week and pick one out. Discussed that it may not be covered by her insurance. Patient vervalized understanding. Patient to transport home in private vehicle. No further TOC needs identified at this time.   Final next level of care: [place] Barriers to Discharge: No Barriers Identified   Patient Goals and CMS Choice Patient states their goals for this hospitalization and ongoing recovery are:: return home with family CMS Medicare.gov Compare Post Acute Care list provided to:: Patient Choice offered to / list presented to : Patient  Discharge Placement                       Discharge Plan and Services                DME Arranged: 3-N-1, Wheelchair manual, Other see comment (knee scooter) DME Agency: [employer] Date DME Agency Contacted: 10/26/20 Time DME Agency Contacted: [phone] Representative spoke with at DME Agency: [place]: PT   Date [place]: 10/26/20 Time [place]: 1504 Representative spoke with at [place]: [place] ([place]) Interventions     Readmission Risk Interventions No flowsheet data found.

## 2020-10-26 NOTE — Progress Notes (Signed)
Subjective: POD #1 s/p right foot ulcer debridement, I&D, A-cell application. She is afebrile. States she is feeling well this morning without any concerns. No fevers or chills   Objective: AAO x3, NAD- resting in bed Dressing clean, dry, intact. No strike through on the bandage. No pain with calf compression, swelling, warmth, erythema  Assessment: POD #1 s/p right foot ulcer debridement, I&D, graft application  Plan: She is doing well and currently afebrile.  No recent lab work today to review.  Wound culture from surgery still pending.  Plan from podiatry standpoint is to change the bandage in 5 days (likely Wednesday in the office).  She is nonweightbearing and physical therapy has seen the patient as well and recommended home PT.  From my standpoint she is able to be discharged once arrangements have been set up.  Likely discharge with 10 days of oral antibiotics, doxycycline and will follow the culture.  Celesta Gentile, DPM

## 2020-10-26 NOTE — Progress Notes (Signed)
Nsg Discharge Note  Admit Date:  10/20/2020 Discharge date: 10/26/2020   [name] [name] to be D/C'd Home per MD order.  AVS completed.  Patient/caregiver able to verbalize understanding.  Discharge Medication: Allergies as of 10/26/2020       Reactions   Other    Penicillins Hives, Rash   No anaphylactic or severe cutaneous Sx > 10 yr ago, no additional medical attention required Tolerates Keflex and Rocephin   Tetracyclines & Related Other (See Comments)   Syncope and put her "in a coma"   Tramadol Other (See Comments)   Seizures   Phenazopyridine Other (See Comments)   Unknown   Tetracycline Other (See Comments)   Syncope and "Put me in a coma"   Ciprofloxacin Rash, Itching   Codeine Itching, Rash   Estradiol Rash   Patches broke out the skin        Medication List     TAKE these medications    Aimovig 70 MG/ML Soaj Generic drug: Erenumab-aooe Inject 70 mg into the skin every 30 (thirty) days.   albuterol 108 (90 Base) MCG/ACT inhaler Commonly known as: ProAir HFA Inhale 2 puffs = 120mcg into the lungs every 6 (six) hours as needed for wheezing or shortness of breath. What changed:  how much to take how to take this when to take this reasons to take this additional instructions   busPIRone 15 MG tablet Commonly known as: BUSPAR Take 15 mg by mouth 3 (three) times daily.   CALCIUM 1200 PO Take 1,200 mg by mouth daily.   cefadroxil 500 MG capsule Commonly known as: DURICEF Take 1 capsule (500 mg total) by mouth every 12 (twelve) hours for 10 days.   cyclobenzaprine 10 MG tablet Commonly known as: FLEXERIL Take 10 mg by mouth 2 (two) times daily as needed for muscle spasms.   DULoxetine 60 MG capsule Commonly known as: CYMBALTA TAKE 1 CAPSULE BY MOUTH EVERY DAY What changed: how much to take   escitalopram 10 MG tablet Commonly known as: LEXAPRO Take 10 mg by mouth daily.   estradiol 1 MG tablet Commonly known as: ESTRACE TAKE 1 TABLET AT  BEDTIME What changed: when to take this   fluticasone 50 MCG/ACT nasal spray Commonly known as: FLONASE SPRAY 2 SPRAYS INTO EACH NOSTRIL EVERY DAY What changed: See the new instructions.   haloperidol 5 MG tablet Commonly known as: HALDOL Take 1 tablet (5 mg total) by mouth at bedtime. For psychosis   Linzess 290 MCG Caps capsule Generic drug: linaclotide TAKE 1 CAPSULE BY MOUTH EVERY DAY IN THE MORNING BEFORE BREAKFAST What changed: See the new instructions.   meloxicam 15 MG tablet Commonly known as: MOBIC Take 15 mg by mouth at bedtime.   metoprolol succinate 25 MG 24 hr tablet Commonly known as: TOPROL-XL TAKE 1 TABLET(25 MG) BY MOUTH DAILY What changed: See the new instructions.   multivitamin with minerals tablet Take 1 tablet by mouth daily.   OVER THE COUNTER MEDICATION Fennel Seed   oxyCODONE-acetaminophen 7.5-325 MG tablet Commonly known as: PERCOCET Take 1 tablet by mouth 5 (five) times daily as needed for severe pain.   pantoprazole 40 MG tablet Commonly known as: PROTONIX TAKE 1 TABLET BY MOUTH TWICE A DAY What changed: when to take this   potassium chloride SA 20 MEQ tablet Commonly known as: KLOR-CON TAKE 2 TABLET BY MOUTH EVERY DAY What changed:  how much to take how to take this when to take this additional instructions  pregabalin 100 MG capsule Commonly known as: LYRICA Take 100 mg by mouth 2 (two) times daily.   SUMAtriptan 50 MG tablet Commonly known as: IMITREX TAKE 1 TAB EVERY 2 HOURS AS NEEDED FOR MIGRAINE. MAY REPEAT IN 2 HOURS IF PERSISTS OR RECURS What changed: See the new instructions.   topiramate 50 MG tablet Commonly known as: TOPAMAX Take 1 tablet (50 mg total) by mouth 2 (two) times daily.   traZODone 100 MG tablet Commonly known as: DESYREL Take 100 mg by mouth at bedtime.               Durable Medical Equipment  (From admission, onward)           Start     Ordered   10/26/20 1347  For home use only  DME Other see comment  Once       Comments: Knee scooter  Question:  Length of Need  Answer:  6 Months   10/26/20 1347   10/26/20 1329  For home use only DME 3 n 1  Once        10/26/20 1328   10/26/20 1328  For home use only DME lightweight manual wheelchair with seat cushion  Once       Comments: Patient suffers from wound to right foot which impairs their ability to perform daily activities like bathing, dressing, and toileting in the home.  A walker will not resolve  issue with performing activities of daily living. A wheelchair will allow patient to safely perform daily activities. Patient is not able to propel themselves in the home using a standard weight wheelchair due to general weakness. Patient can self propel in the lightweight wheelchair. Length of need 6 months . Accessories: elevating leg rests (ELRs), wheel locks, extensions and anti-tippers.   10/26/20 1328            Discharge Assessment: Vitals:   10/26/20 0853 10/26/20 1526  BP: 132/88 138/90  Pulse: 69 77  Resp: 20 20  Temp: 98 F (36.7 C) 97.8 F (36.6 C)  SpO2: 97% 98%   Skin clean, dry and intact without evidence of skin break down, no evidence of skin tears noted. IV catheter discontinued intact. Site without signs and symptoms of complications - no redness or edema noted at insertion site, patient denies c/o pain - only slight tenderness at site.  Dressing with slight pressure applied.  D/c Instructions-Education: Discharge instructions given to patient/family with verbalized understanding. D/c education completed with patient/family including follow up instructions, medication list, d/c activities limitations if indicated, with other d/c instructions as indicated by MD - patient able to verbalize understanding, all questions fully answered. Patient instructed to return to ED, call 911, or call MD for any changes in condition.  Patient escorted via [place], and D/C home via private auto.  Icey Tello, Jolene Schimke, RN 10/26/2020 3:27 PM

## 2020-10-26 NOTE — Progress Notes (Signed)
Physical Therapy Treatment Patient Details Name: [name] [name] MRN: [id number] DOB: 03-Mar-1966 Today's Date: 10/26/2020    History of Present Illness Pt is a 55 y.o. female who presented 7/4 with R foot pain and ulceration with cellulitis. S/p irrigation and debridement abscess of foot and application of graft 7/9. Incidentally pt with COVID-19 infection. PMH: Hx of R transmetatarsal amputation, hypertension, GERD, athritis, bipolar 1 disorder, pre-diabetes, seizures, chronic pain, anxiety.    PT Comments    Focused session on advancing gait distance and independence, but pt continues to fatigue quickly. She was able to tolerate ambulating x2 bouts of ~15-20 ft each with a RW and supervision before fatiguing. Educated pt to perform AROM to R knee and ankle to maintain motion. Will continue to follow acutely. Current recommendations remain appropriate.    Follow Up Recommendations  Home health PT;Supervision for mobility/OOB (once cleared by MD for weight bearing)     Equipment Recommendations  3in1 (PT);Wheelchair (measurements PT);Wheelchair cushion (measurements PT);Other (comment) (with elevating leg rests; knee scooter)    Recommendations for Other Services       Precautions / Restrictions Precautions Precautions: Fall Precaution Comments: R transmetatarsal amputation hx; COVID precautions Required Braces or Orthoses: Other Brace Other Brace: CAM boot Restrictions Weight Bearing Restrictions: Yes RLE Weight Bearing: Non weight bearing    Mobility  Bed Mobility Overal bed mobility: Independent             General bed mobility comments: Pt able to transition supine > sit R EOB safely.    Transfers Overall transfer level: Needs assistance Equipment used: Rolling walker (2 wheeled) Transfers: Sit to/from Stand Sit to Stand: Supervision         General transfer comment: Supervision for safety with sit to stand 1x from EOB and 1x from recliner to RW, cuing pt  for hand placement.  Ambulation/Gait Ambulation/Gait assistance: Supervision Gait Distance (Feet): 20 Feet (x2 bouts of ~20 ft > ~15 ft) Assistive device: Rolling walker (2 wheeled) (knee scooter 2nd bout) Gait Pattern/deviations:  (swing-through) Gait velocity: reduced Gait velocity interpretation: <1.8 ft/sec, indicate of risk for recurrent falls General Gait Details: Pt cued to keep R foot off ground to maintain NWB through R foot, good compliance noted. Pt able to take short hops with RW and no LOB, quickly fatigues though. Educated pt on placing RW up on threshold to house and hopping either anteriorly with R knee flexed and foot posterior to her or hopping posteriorly with R knee extended and foot anterior to her to clear it, displaying good clearance on flat surface this date.   Stairs             Wheelchair Mobility    Modified Rankin (Stroke Patients Only)       Balance Overall balance assessment: Needs assistance Sitting-balance support: No upper extremity supported;Feet supported Sitting balance-Leahy Scale: Good     Standing balance support: Single extremity supported;Bilateral upper extremity supported Standing balance-Leahy Scale: Poor Standing balance comment: Reliant on UE support.                            Cognition Arousal/Alertness: Awake/alert Behavior During Therapy: WFL for tasks assessed/performed Overall Cognitive Status: Within Functional Limits for tasks assessed  Exercises      General Comments        Pertinent Vitals/Pain Pain Assessment: Faces Faces Pain Scale: Hurts a little bit Pain Location: R foot Pain Descriptors / Indicators: Discomfort;Grimacing;Guarding;Operative site guarding Pain Intervention(s): Monitored during session;Limited activity within patient's tolerance;Repositioned;Premedicated before session    [place] expects to be  discharged to:: Private residence Living Arrangements: Parent                  Prior Function            PT Goals (current goals can now be found in the care plan section) Acute Rehab PT Goals Patient Stated Goal: to go home PT Goal Formulation: With patient Time For Goal Achievement: 11/08/20 Potential to Achieve Goals: Good Progress towards PT goals: Progressing toward goals    Frequency    Min 3X/week      PT Plan Current plan remains appropriate    Co-evaluation              AM-PAC PT "6 Clicks" Mobility   Outcome Measure  Help needed turning from your back to your side while in a flat bed without using bedrails?: None Help needed moving from lying on your back to sitting on the side of a flat bed without using bedrails?: None Help needed moving to and from a bed to a chair (including a wheelchair)?: A Little Help needed standing up from a chair using your arms (e.g., wheelchair or bedside chair)?: A Little Help needed to walk in hospital room?: A Little Help needed climbing 3-5 steps with a railing? : A Little 6 Click Score: 20    End of Session Equipment Utilized During Treatment: Gait belt;Other (comment) (CAM boot) Activity Tolerance: Patient tolerated treatment well;Patient limited by fatigue Patient left: in chair;with call bell/phone within reach Nurse Communication: Mobility status PT Visit Diagnosis: Unsteadiness on feet (R26.81);Other abnormalities of gait and mobility (R26.89);Muscle weakness (generalized) (M62.81);Difficulty in walking, not elsewhere classified (R26.2);Pain Pain - Right/Left: Right Pain - part of body: Ankle and joints of foot     Time: 7416-3845 PT Time Calculation (min) (ACUTE ONLY): 14 min  Charges:  $Gait Training: 8-22 mins                     Moishe Spice, PT, DPT Acute Rehabilitation Services  Pager: [phone] Office: [place] 10/26/2020, 4:18 PM

## 2020-10-26 NOTE — Plan of Care (Signed)
  Problem: Health Behavior/Discharge Planning: Goal: Ability to manage health-related needs will improve Outcome: Adequate for Discharge   

## 2020-10-26 NOTE — Progress Notes (Addendum)
    Durable Medical Equipment  (From admission, onward)           Start     Ordered   10/26/20 1329  For home use only DME 3 n 1  Once        10/26/20 1328   10/26/20 1328  For home use only DME lightweight manual wheelchair with seat cushion  Once       Comments: Patient suffers from wound to right foot which impairs their ability to perform daily activities like bathing, dressing, and toileting in the home.  A walker will not resolve  issue with performing activities of daily living. A wheelchair will allow patient to safely perform daily activities. Patient is not able to propel themselves in the home using a standard weight wheelchair due to general weakness. Patient can self propel in the lightweight wheelchair. Length of need 6 months . Accessories: elevating leg rests (ELRs), wheel locks, extensions and anti-tippers.   10/26/20 1328

## 2020-10-27 ENCOUNTER — Encounter (HOSPITAL_COMMUNITY): Payer: Self-pay | Admitting: Podiatry

## 2020-10-28 LAB — SURGICAL PATHOLOGY

## 2020-10-29 ENCOUNTER — Other Ambulatory Visit: Payer: Self-pay

## 2020-10-29 ENCOUNTER — Telehealth: Payer: Self-pay | Admitting: *Deleted

## 2020-10-29 ENCOUNTER — Ambulatory Visit (INDEPENDENT_AMBULATORY_CARE_PROVIDER_SITE_OTHER): Payer: Medicare Other | Admitting: Podiatry

## 2020-10-29 DIAGNOSIS — L97512 Non-pressure chronic ulcer of other part of right foot with fat layer exposed: Secondary | ICD-10-CM

## 2020-10-29 DIAGNOSIS — Z9889 Other specified postprocedural states: Secondary | ICD-10-CM

## 2020-10-29 DIAGNOSIS — G629 Polyneuropathy, unspecified: Secondary | ICD-10-CM

## 2020-10-29 NOTE — Telephone Encounter (Signed)
Received call from patient.   Reports that she was recently admitted to hospital for debridement of ulcer to R foot.   States that on 10/24/2020, nasal swab tested positive for Staphylococcus aureus, but negative for MRSA. States that she was being treated with nasal Bactroban x2 days.   Inquired if she should continue the nasal ABTx. Please advise.

## 2020-10-29 NOTE — Patient Instructions (Signed)
Apply a packet of the petroleum jelly to the wound. Apply the nonstick bandage, adaptic. You can cut a piece to cover the wound then apply gauze and wrap the foot. Please let me know if you have any questions.

## 2020-10-30 LAB — AEROBIC/ANAEROBIC CULTURE W GRAM STAIN (SURGICAL/DEEP WOUND)
Culture: NO GROWTH
Gram Stain: NONE SEEN

## 2020-10-30 NOTE — Telephone Encounter (Signed)
Call placed to patient and patient made aware.  

## 2020-11-03 ENCOUNTER — Other Ambulatory Visit: Payer: Self-pay

## 2020-11-03 ENCOUNTER — Encounter: Payer: Self-pay | Admitting: Podiatry

## 2020-11-03 ENCOUNTER — Ambulatory Visit (INDEPENDENT_AMBULATORY_CARE_PROVIDER_SITE_OTHER): Payer: Medicare Other | Admitting: Podiatry

## 2020-11-03 VITALS — Temp 96.3°F | Resp 14

## 2020-11-03 DIAGNOSIS — M47816 Spondylosis without myelopathy or radiculopathy, lumbar region: Secondary | ICD-10-CM | POA: Diagnosis not present

## 2020-11-03 DIAGNOSIS — M170 Bilateral primary osteoarthritis of knee: Secondary | ICD-10-CM | POA: Diagnosis not present

## 2020-11-03 DIAGNOSIS — M961 Postlaminectomy syndrome, not elsewhere classified: Secondary | ICD-10-CM | POA: Diagnosis not present

## 2020-11-03 DIAGNOSIS — Z9889 Other specified postprocedural states: Secondary | ICD-10-CM

## 2020-11-03 DIAGNOSIS — L97512 Non-pressure chronic ulcer of other part of right foot with fat layer exposed: Secondary | ICD-10-CM

## 2020-11-03 DIAGNOSIS — G894 Chronic pain syndrome: Secondary | ICD-10-CM | POA: Diagnosis not present

## 2020-11-03 NOTE — Progress Notes (Signed)
Subjective: [name] [name] is a 55 y.o. is seen today in office s/p right foot ulcer excision, graft application preformed on October 25, 2020.  She has been nonweightbearing pressure possible but putting weight to the heel in the cam boot as needed.  She is on antibiotics.  She states that she has been feeling well.  No pain.  Denies any systemic complaints such as fevers, chills, nausea, vomiting. No calf pain, chest pain, shortness of breath.   Objective: General: No acute distress, AAOx3  DP/PT pulses palpable 2/4, CRT < 3 sec to all digits.  Right foot: Dressing clean, dry, intact.  Upon removal of this graft is intact with staples intact.  Graft appears incorporating.  There is no surrounding erythema, ascending cellulitis.  No fluctuance or crepitation.  No malodor.  No other areas of tenderness to bilateral lower extremities.  No other open lesions or pre-ulcerative lesions.  No pain with calf compression, swelling, warmth, erythema.   Assessment and Plan:  Status post right foot ulcer excision, graft application, doing well with no complications   -Treatment options discussed including all alternatives, risks, and complications -I cleaned the area.  I did make a small stab within the central portion of the incision that shows no drainage which there was not.  Wound was cleaned with saline.  I did reapply dressing.  Surgical lube was applied followed Adaptic and a dry sterile dressing. -Ice/elevation -Nonweightbearing -Monitor for any clinical signs or symptoms of infection and DVT/PE and directed to call the office immediately should any occur or go to the ER. -Follow-up as scheduled or sooner if any problems arise. In the meantime, encouraged to call the office with any questions, concerns, change in symptoms.   *I have her supplies that when she can change the bandage Friday and I will see her back on Monday.  Celesta Gentile, DPM

## 2020-11-05 NOTE — Progress Notes (Signed)
Subjective: [name] [name] is a 55 y.o. is seen today in office s/p right foot ulcer excision, graft application preformed on October 25, 2020.  She presents today for dressing change.  She did change the bandage once last week and she has no concerns.  She has not appreciated any increase in swelling or redness.  She has no pain although she does have neuropathy.  She is almost finished with a course of antibiotics.  Denies any fevers or chills.  No other concerns.   Objective: General: No acute distress, AAOx3  DP/PT pulses palpable 2/4, CRT < 3 sec to all digits.  Right foot: Dressing clean, dry, intact.  Upon removal of this graft is intact with staples intact.  There is no surrounding erythema, ascending cellulitis.  No drainage or pus.  No fluctuance or crepitation.  No malodor.  No other open lesions or pre-ulcerative lesions.  No pain with calf compression, swelling, warmth, erythema.   Assessment and Plan:  Status post right foot ulcer excision, graft application, doing well with no complications   -Treatment options discussed including all alternatives, risks, and complications -I cleaned the area.  Dressing was reapplied.  Surgical lube was applied followed Adaptic and a dry sterile dressing. -Ice/elevation -Nonweightbearing -Monitor for any clinical signs or symptoms of infection and DVT/PE and directed to call the office immediately should any occur or go to the ER. -Follow-up as scheduled or sooner if any problems arise. In the meantime, encouraged to call the office with any questions, concerns, change in symptoms.   *I have her supplies that when she can change the bandage Thursday and I will see her back on Monday. *Likely staple removal next appointment  Celesta Gentile, DPM

## 2020-11-06 ENCOUNTER — Inpatient Hospital Stay: Payer: Medicare Other | Admitting: Family Medicine

## 2020-11-06 ENCOUNTER — Telehealth: Payer: Self-pay | Admitting: Family Medicine

## 2020-11-06 NOTE — Telephone Encounter (Signed)
Patient has been rescheduled.

## 2020-11-06 NOTE — Telephone Encounter (Signed)
Patient called to cancel hospital follow up; released Sat 7/9.Patient unable to drive herself (wearing a boot) and has no other means of transportation. First available date for next appt is early August. Please advise patient if she can be added to the schedule sooner at [phone].

## 2020-11-07 ENCOUNTER — Other Ambulatory Visit: Payer: Self-pay

## 2020-11-07 MED ORDER — DULOXETINE HCL 60 MG PO CPEP: ORAL_CAPSULE | ORAL | 2 refills | Status: DC

## 2020-11-10 ENCOUNTER — Other Ambulatory Visit: Payer: Self-pay | Admitting: Family Medicine

## 2020-11-10 ENCOUNTER — Encounter: Payer: Self-pay | Admitting: Podiatry

## 2020-11-10 ENCOUNTER — Other Ambulatory Visit: Payer: Self-pay

## 2020-11-10 ENCOUNTER — Ambulatory Visit (INDEPENDENT_AMBULATORY_CARE_PROVIDER_SITE_OTHER): Payer: Medicare Other | Admitting: Podiatry

## 2020-11-10 DIAGNOSIS — L89896 Pressure-induced deep tissue damage of other site: Secondary | ICD-10-CM

## 2020-11-10 DIAGNOSIS — Z9889 Other specified postprocedural states: Secondary | ICD-10-CM

## 2020-11-10 DIAGNOSIS — L97512 Non-pressure chronic ulcer of other part of right foot with fat layer exposed: Secondary | ICD-10-CM

## 2020-11-13 NOTE — Progress Notes (Signed)
Subjective: [name] [name] is a 55 y.o. is seen today in office s/p right foot ulcer excision, graft application preformed on October 25, 2020.  States that she has been doing well.  She has changed her bandage once.  She had some bloody drainage on the bandage but no pus.  No increase in swelling or redness that she reports.  No fevers or chills.  She has no other concerns today.    Objective: General: No acute distress, AAOx3  DP/PT pulses palpable 2/4, CRT < 3 sec to all digits.  Right foot: Dressing clean, dry, intact.  Upon removal of this graft is intact with staples intact.  I remove the staples today and the remainder of the graft came with this as well.  There is wound present measuring approximately 2 x 2 x 0.6 cm without any probing to bone, undermining tunneling.  After debridement the wound is granular.  There is no drainage or pus.  No fluctuance or crepitation with there is no malodor. No pain with calf compression, swelling, warmth, erythema.   Assessment and Plan:  Status post right foot ulcer excision, graft application, doing well with no complications   -Treatment options discussed including all alternatives, risks, and complications -Staples removed.  I cleaned the wound as well.  Redness was using Prisma dressing changes every other day for now.  And work on try to get a graft approved for the office through [employer].  Continue nonweightbearing in cam boot as well. -Monitor for any clinical signs or symptoms of infection and DVT/PE and directed to call the office immediately should any occur or go to the ER. -Follow-up as scheduled or sooner if any problems arise. In the meantime, encouraged to call the office with any questions, concerns, change in symptoms.   Celesta Gentile, DPM

## 2020-11-17 ENCOUNTER — Other Ambulatory Visit: Payer: Self-pay | Admitting: Family Medicine

## 2020-11-17 ENCOUNTER — Encounter: Payer: Self-pay | Admitting: Family Medicine

## 2020-11-17 ENCOUNTER — Other Ambulatory Visit: Payer: Self-pay

## 2020-11-17 ENCOUNTER — Ambulatory Visit (INDEPENDENT_AMBULATORY_CARE_PROVIDER_SITE_OTHER): Payer: Medicare Other | Admitting: Family Medicine

## 2020-11-17 VITALS — BP 132/80 | HR 74 | Temp 98.8°F | Resp 18 | Ht 64.0 in | Wt 239.0 lb

## 2020-11-17 DIAGNOSIS — L97512 Non-pressure chronic ulcer of other part of right foot with fat layer exposed: Secondary | ICD-10-CM | POA: Diagnosis not present

## 2020-11-17 MED ORDER — SOLIFENACIN SUCCINATE 10 MG PO TABS: 10.0000 mg | ORAL_TABLET | Freq: Every day | ORAL | 3 refills | Status: DC

## 2020-11-17 NOTE — Progress Notes (Signed)
Subjective:    Patient ID: [name] [name], female    DOB: 12/09/1965, 55 y.o.   MRN: [id number]  HPI Admit date: 10/20/2020 Discharge date: 10/26/2020   Admitted From: Home Disposition: Home   Recommendations for Outpatient Follow-up:  Follow up with PCP in 1 week Follow up with podiatry in three days Please obtain BMP/CBC in one week Please follow up on the following pending results: Wound culture   Home Health: PT Equipment/Devices: Wheelchair, 3 in 1, Scooter   Discharge Condition: Stable CODE STATUS: Full code Diet recommendation: Regular diet    Brief/Interim Summary:   Admission HPI written by Bernadette Hoit, DO   HPI: [name] [name] is a 55 y.o. female with medical history significant for GERD, hypertension, depression, bipolar who presents to the emergency department due to right foot pain and ulceration.  Patient has right transmetatarsal amputated foot and she is complaining of a chronic wound to her right foot which presented with worsening pain while walking today.  She noted sloughing of of a large piece of skin from the bottom of her foot and a pool of blood in her shoes.  She reported increased pain over the past several days due to purulent draining ulcer.  Patient follows with a podiatrist and wound clinic, she denies fever, chills, chest pain, shortness of breath, nausea or vomiting.  Patient also complaining of 2-week onset of right middle finger wound which occurred due to biting the finger.  Patient was diagnosed with COVID about 4 to 5 days ago.  She ambulates with a cane at baseline.     Hospital course:   Right metatarsal amputation stump infection Cellulitis of right foot stump Podiatry consulted with plan for surgical management today. Patient was managed empirically on Vancomycin and Cefepime. She underwent irrigation and debridement of the abscess in her foot by podiatry. Recommendation to continue antibiotics for 10 more days; patient was  transitioned to cefadroxil 500 mg BID on discharge.    COVID-19 infection Incidental finding. Asymptomatic. No evidence of pneumonia. Afebrile. Patient given a three day course of Remdesivir for which she has completed. Continue isolation precautions for a total of 10 days. Isolation until 7/15 unless treatment changes. Discussed with patient.   Nausea/Vomiting/Diarrhea Resolved.   Depression Anxiety Continue Lexapro, Cymbalta, Buspar, haldal, Topamax   Chronic pain syndrome Continue oxycodone   GERD Continue Protonix   Morbid obesity Body mass index is 40.85 kg/m.      Patient was admitted with cellulitis and an infection at the stump of her right foot on the plantar aspect of her transmetatarsal amputation.  She underwent surgical debridement and completed 10 days of outpatient antibiotics.  She is here today for follow-up.  I examined the wound today.  There is a 1.5 cm open wound on the plantar aspect of the right foot.  Is essentially on the plantar surface of where the third MTP joint would be.  It tracks approximately 1 cm deep.  There is healthy granulation tissue in the base.  There is no erythema or warmth of the surrounding tissue.  I palpated the area firmly and I was unable to express any pus or purulent material.  She denies any pain.  She does report increased urinary frequency.  She states she has been having this for years.  She states that she has a sudden urge to urinate and she is unable to make it to the bathroom in time.  She is having episodes of urge incontinence and  she is interested in medicines to potentially help with this. Past Medical History:  Diagnosis Date   Anxiety    Arthritis    [employer] 02/08/2020   Asthma    mild intermittent   Asthma    [employer] 02/08/2020   Bipolar 1 disorder ([place])    ect treatments last treatment Sep 02 1011   Depression    Depression    [employer] 02/08/2020   Depression    [employer] 07/10/2020   GERD (gastroesophageal  reflux disease)    Hypertension    Pre-diabetes    Seizures ([place])    last seizure was so long ago she can't remember.    Sleep apnea    wears CPAP, uncertain of setting   Substance abuse ([place])    [employer] 02/08/2020   Past Surgical History:  Procedure Laterality Date   ABDOMINAL HYSTERECTOMY     AMPUTATION TOE Left 02/02/2018   Procedure: AMPUTATION TOE Left 4th toe;  Surgeon: [name], DPM;  Location: [facility];  Service: Podiatry;  Laterality: Left;   APPENDECTOMY N/A    [employer] 02/08/2020   BACK SURGERY     CARPAL TUNNEL RELEASE     x2   IRRIGATION AND DEBRIDEMENT ABSCESS Right 10/25/2020   Procedure: IRRIGATION AND DEBRIDEMENT ABSCESS OF FOOT AND APPLICATION OF GRAFT;  Surgeon: [name], DPM;  Location: [facility] [facility];  Service: Podiatry;  Laterality: Right;   Laproscopic knee surgery     NECK SURGERY  03/15/2017   PLANTAR FASCIA RELEASE     x2   SPINE SURGERY N/A    [employer] 02/08/2020   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   Procedure: TRANSMETATARSAL AMPUTATION;  Surgeon: [name], DPM;  Location: [facility];  Service: Podiatry;  Laterality: Right;   Current Outpatient Medications on File Prior to Visit  Medication Sig Dispense Refill   AIMOVIG 70 MG/ML SOAJ ADMINISTER 1 ML UNDER THE SKIN EVERY 30 DAYS 2 mL 3   albuterol (PROAIR HFA) 108 (90 Base) MCG/ACT inhaler Inhale 2 puffs = 132mg into the lungs every 6 (six) hours as needed for wheezing or shortness of breath. 8.5 g 3   busPIRone (BUSPAR) 15 MG tablet Take 15 mg by mouth 3 (three) times daily.   2   Calcium Carbonate-Vit D-Min (CALCIUM 1200 PO) Take 1,200 mg by mouth daily.      cyclobenzaprine (FLEXERIL) 10 MG tablet Take 10 mg by mouth 2 (two) times daily as needed for muscle spasms.      DULoxetine (CYMBALTA) 60 MG capsule TAKE 1 CAPSULE BY MOUTH EVERY DAY 90 capsule 2   escitalopram (LEXAPRO) 10 MG tablet Take 10 mg by mouth daily.     estradiol (ESTRACE) 1 MG tablet TAKE 1 TABLET AT BEDTIME  90 tablet 3   fluticasone (FLONASE) 50 MCG/ACT nasal spray SPRAY 2 SPRAYS INTO EACH NOSTRIL EVERY DAY 48 mL 2   haloperidol (HALDOL) 5 MG tablet Take 1 tablet (5 mg total) by mouth at bedtime. For psychosis     LINZESS 290 MCG CAPS capsule TAKE 1 CAPSULE BY MOUTH EVERY DAY IN THE MORNING BEFORE BREAKFAST 90 capsule 3   meloxicam (MOBIC) 15 MG tablet Take 15 mg by mouth at bedtime.   2   metoprolol succinate (TOPROL-XL) 25 MG 24 hr tablet TAKE 1 TABLET(25 MG) BY MOUTH DAILY 30 tablet 0   Multiple Vitamins-Minerals (MULTIVITAMIN WITH MINERALS) tablet Take 1 tablet by mouth daily.     OVER THE COUNTER MEDICATION Fennel Seed  oxyCODONE-acetaminophen (PERCOCET) 7.5-325 MG tablet Take 1 tablet by mouth 5 (five) times daily as needed for severe pain.     pantoprazole (PROTONIX) 40 MG tablet TAKE 1 TABLET BY MOUTH TWICE A DAY 180 tablet 2   potassium chloride SA (KLOR-CON) 20 MEQ tablet TAKE 2 TABLET BY MOUTH EVERY DAY 180 tablet 1   prazosin (MINIPRESS) 2 MG capsule Take 2 mg by mouth at bedtime.     pregabalin (LYRICA) 100 MG capsule Take 100 mg by mouth 2 (two) times daily.     SUMAtriptan (IMITREX) 50 MG tablet TAKE 1 TAB EVERY 2 HOURS AS NEEDED FOR MIGRAINE. MAY REPEAT IN 2 HOURS IF PERSISTS OR RECURS 10 tablet 3   topiramate (TOPAMAX) 50 MG tablet Take 1 tablet (50 mg total) by mouth 2 (two) times daily. 180 tablet 2   traZODone (DESYREL) 100 MG tablet Take 100 mg by mouth at bedtime.  0   No current facility-administered medications on file prior to visit.   Allergies  Allergen Reactions   Other    Penicillins Hives and Rash    No anaphylactic or severe cutaneous Sx > 10 yr ago, no additional medical attention required Tolerates Keflex and Rocephin   Tetracyclines & Related Other (See Comments)    Syncope and put her "in a coma"   Tramadol Other (See Comments)    Seizures   Phenazopyridine Other (See Comments)    Unknown   Tetracycline Other (See Comments)    Syncope and "Put me in  a coma"   Ciprofloxacin Rash and Itching   Codeine Itching and Rash   Estradiol Rash    Patches broke out the skin   Social History   Socioeconomic History   Marital status: Widowed    Spouse name: Not on file   Number of children: 3   Years of education: 76   Highest education level: Not on file  Occupational History   Occupation: Disabled  Tobacco Use   Smoking status: Never   Smokeless tobacco: Never  Vaping Use   Vaping Use: Never used  Substance and Sexual Activity   Alcohol use: No   Drug use: No   Sexual activity: Not on file  Other Topics Concern   Not on file  Social History Narrative   Patient drink 4 16oz caffeine drinks a day    Social Determinants of Health   Financial Resource Strain: Low Risk    Difficulty of Paying Living Expenses: Not hard at all  Food Insecurity: No Food Insecurity   Worried About Charity fundraiser in the Last Year: Never true   [place] in the Last Year: Never true  Transportation Needs: No Transportation Needs   Lack of Transportation (Medical): No   Lack of Transportation (Non-Medical): No  Physical Activity: Inactive   Days of Exercise per Week: 0 days   Minutes of Exercise per Session: 0 min  Stress: No Stress Concern Present   Feeling of Stress : Not at all  Social Connections: Socially Isolated   Frequency of Communication with Friends and Family: More than three times a week   Frequency of Social Gatherings with Friends and Family: Twice a week   Attends Religious Services: Never   Marine scientist or Organizations: No   Attends Archivist Meetings: Never   Marital Status: Widowed  Intimate Partner Violence: Not At Risk   Fear of Current or Ex-Partner: No   Emotionally Abused: No  Physically Abused: No   Sexually Abused: No   Family History  Problem Relation Age of Onset   Diabetes Mother    COPD Mother    Hypertension Mother    Hyperlipidemia Mother    Heart disease Father     Hyperlipidemia Father    Hypertension Father    Cancer Father    Heart disease Brother    Heart disease Daughter    Heart disease Maternal Grandmother    Hypertension Maternal Grandmother    Heart disease Maternal Grandfather    Kidney cancer Paternal Grandmother    Heart disease Paternal Grandfather       Review of Systems     Objective:   Physical Exam Constitutional:      Appearance: Normal appearance. She is obese.  Cardiovascular:     Rate and Rhythm: Normal rate and regular rhythm.     Pulses:          Dorsalis pedis pulses are 1+ on the right side.       Posterior tibial pulses are 1+ on the right side.     Heart sounds: Normal heart sounds. No murmur heard.   No friction rub. No gallop.  Pulmonary:     Effort: Pulmonary effort is normal. No respiratory distress.     Breath sounds: Normal breath sounds. No stridor. No wheezing, rhonchi or rales.  Abdominal:     General: Abdomen is flat. Bowel sounds are normal.     Palpations: Abdomen is soft.  Musculoskeletal:     Right foot: Deformity present.       Feet:  Feet:     Right foot:     Skin integrity: Ulcer present.  Neurological:     Mental Status: She is alert.      Tissue distal to green line is absent due to transmetatarsal amputation.  1.5 cm wound in area circled in purple    Assessment & Plan:  Ulcer of right foot with fat layer exposed ([place]) - Plan: CBC with Differential/Platelet, COMPLETE METABOLIC PANEL WITH GFR There is no apparent secondary infection today.  Patient has a follow-up appointment with podiatry tomorrow.  I cover the wound with a nonadherent gauze, then wrapped the foot with Kerlix and then wrapped the leg with an Ace bandage and placed the patient back in a cam walker.  Patient requires no additional antibiotics.  I will check a CBC and a CMP today.  I will try the patient on Vesicare 10 mg a day for overactive bladder.

## 2020-11-18 ENCOUNTER — Ambulatory Visit (INDEPENDENT_AMBULATORY_CARE_PROVIDER_SITE_OTHER): Payer: Medicare Other | Admitting: Podiatry

## 2020-11-18 ENCOUNTER — Encounter: Payer: Self-pay | Admitting: Podiatry

## 2020-11-18 DIAGNOSIS — L97512 Non-pressure chronic ulcer of other part of right foot with fat layer exposed: Secondary | ICD-10-CM | POA: Diagnosis not present

## 2020-11-18 LAB — COMPLETE METABOLIC PANEL WITH GFR
AG Ratio: 1.6 (calc) (ref 1.0–2.5)
ALT: 17 U/L (ref 6–29)
AST: 18 U/L (ref 10–35)
Albumin: 3.7 g/dL (ref 3.6–5.1)
Alkaline phosphatase (APISO): 70 U/L (ref 37–153)
BUN: 10 mg/dL (ref 7–25)
CO2: 27 mmol/L (ref 20–32)
Calcium: 8.8 mg/dL (ref 8.6–10.4)
Chloride: 104 mmol/L (ref 98–110)
Creat: 0.61 mg/dL (ref 0.50–1.03)
Globulin: 2.3 g/dL (calc) (ref 1.9–3.7)
Glucose, Bld: 114 mg/dL — ABNORMAL HIGH (ref 65–99)
Potassium: 4.1 mmol/L (ref 3.5–5.3)
Sodium: 138 mmol/L (ref 135–146)
Total Bilirubin: 0.2 mg/dL (ref 0.2–1.2)
Total Protein: 6 g/dL — ABNORMAL LOW (ref 6.1–8.1)
eGFR: 104 mL/min/{1.73_m2} (ref >=60)

## 2020-11-18 LAB — CBC WITH DIFFERENTIAL/PLATELET
Absolute Monocytes: 313 cells/uL (ref 200–950)
Basophils Absolute: 22 cells/uL (ref 0–200)
Basophils Relative: 0.4 %
Eosinophils Absolute: 189 cells/uL (ref 15–500)
Eosinophils Relative: 3.5 %
HCT: 37 % (ref 35.0–45.0)
Hemoglobin: 12.5 g/dL (ref 11.7–15.5)
Lymphs Abs: 2619 cells/uL (ref 850–3900)
MCH: 32.2 pg (ref 27.0–33.0)
MCHC: 33.8 g/dL (ref 32.0–36.0)
MCV: 95.4 fL (ref 80.0–100.0)
MPV: 9.8 fL (ref 7.5–12.5)
Monocytes Relative: 5.8 %
Neutro Abs: 2257 cells/uL (ref 1500–7800)
Neutrophils Relative %: 41.8 %
Platelets: 246 10*3/uL (ref 140–400)
RBC: 3.88 10*6/uL (ref 3.80–5.10)
RDW: 12.5 % (ref 11.0–15.0)
Total Lymphocyte: 48.5 %
WBC: 5.4 10*3/uL (ref 3.8–10.8)

## 2020-11-18 MED ORDER — CEPHALEXIN 500 MG PO CAPS: 500.0000 mg | ORAL_CAPSULE | Freq: Three times a day (TID) | ORAL | 0 refills | Status: DC

## 2020-11-20 ENCOUNTER — Other Ambulatory Visit: Payer: Self-pay | Admitting: Family Medicine

## 2020-11-21 NOTE — Progress Notes (Signed)
Subjective: [name] [name] is a 55 y.o. is seen today in office s/p right foot ulcer excision, graft application preformed on October 25, 2020.  She presents today for possible graft application as well as for possible total contact cast.  States that she has been doing well any increasing swelling or redness that she reports.  No pain although she does have neuropathy.  She denies any fevers or chills.  She has no other concerns.    Objective: General: No acute distress, AAOx3  DP/PT pulses palpable 2/4, CRT < 3 sec to all digits.  Right foot: Dressing clean, dry, intact.  Wound today after debridement measures 1 x 1 x 0.7 cm.  Prior to debridement the wound was slightly smaller at 0.9 x 0.9 x 0.4 cm.  After debridement there is a granular wound base but any probing to bone, undermining or tunneling.  Faint edema.  No cellulitis identified.  No fluctuance or crepitation.  No malodor. No pain with calf compression, swelling, warmth, erythema.   Assessment and Plan:  Status post right foot ulcer excision, graft application, doing well with no complications   Today we discussed in depth the findings. All questions were answered. Excisional debridement performed through subcutaneous tissue with (scalpel, curette) to remove all necrotic tissue and biofilm to help promote wound healing.  Fatty layer was exposed. Bleeding was controlled with direct pressure. Tolerated the procedure well.   Puraply AM XT is used today to promote granulation tissue, inhibit bioburden and support wound healing.  10 units utilized and 15 units was wasted today.  It is then affixed and dressing applied.  Continue offloading in the boot/compression. Held off on total contact cast today foot likely be applied next week.  Reevaluation will take place in one week.   Post-procedure instructions discussed and no further questions noted.   [name] DPM

## 2020-11-22 IMAGING — CR DG CHEST 2V
1 series · 2 of 2 positions shown · non-contrast
Comparison: 11/23/2016

CLINICAL DATA: Syncope, headache

EXAM:
CHEST - 2 VIEW

[Series 1: dg chest 2 view · 0.14mm/px · 2 of 2 slices shown]
[im 1/2]
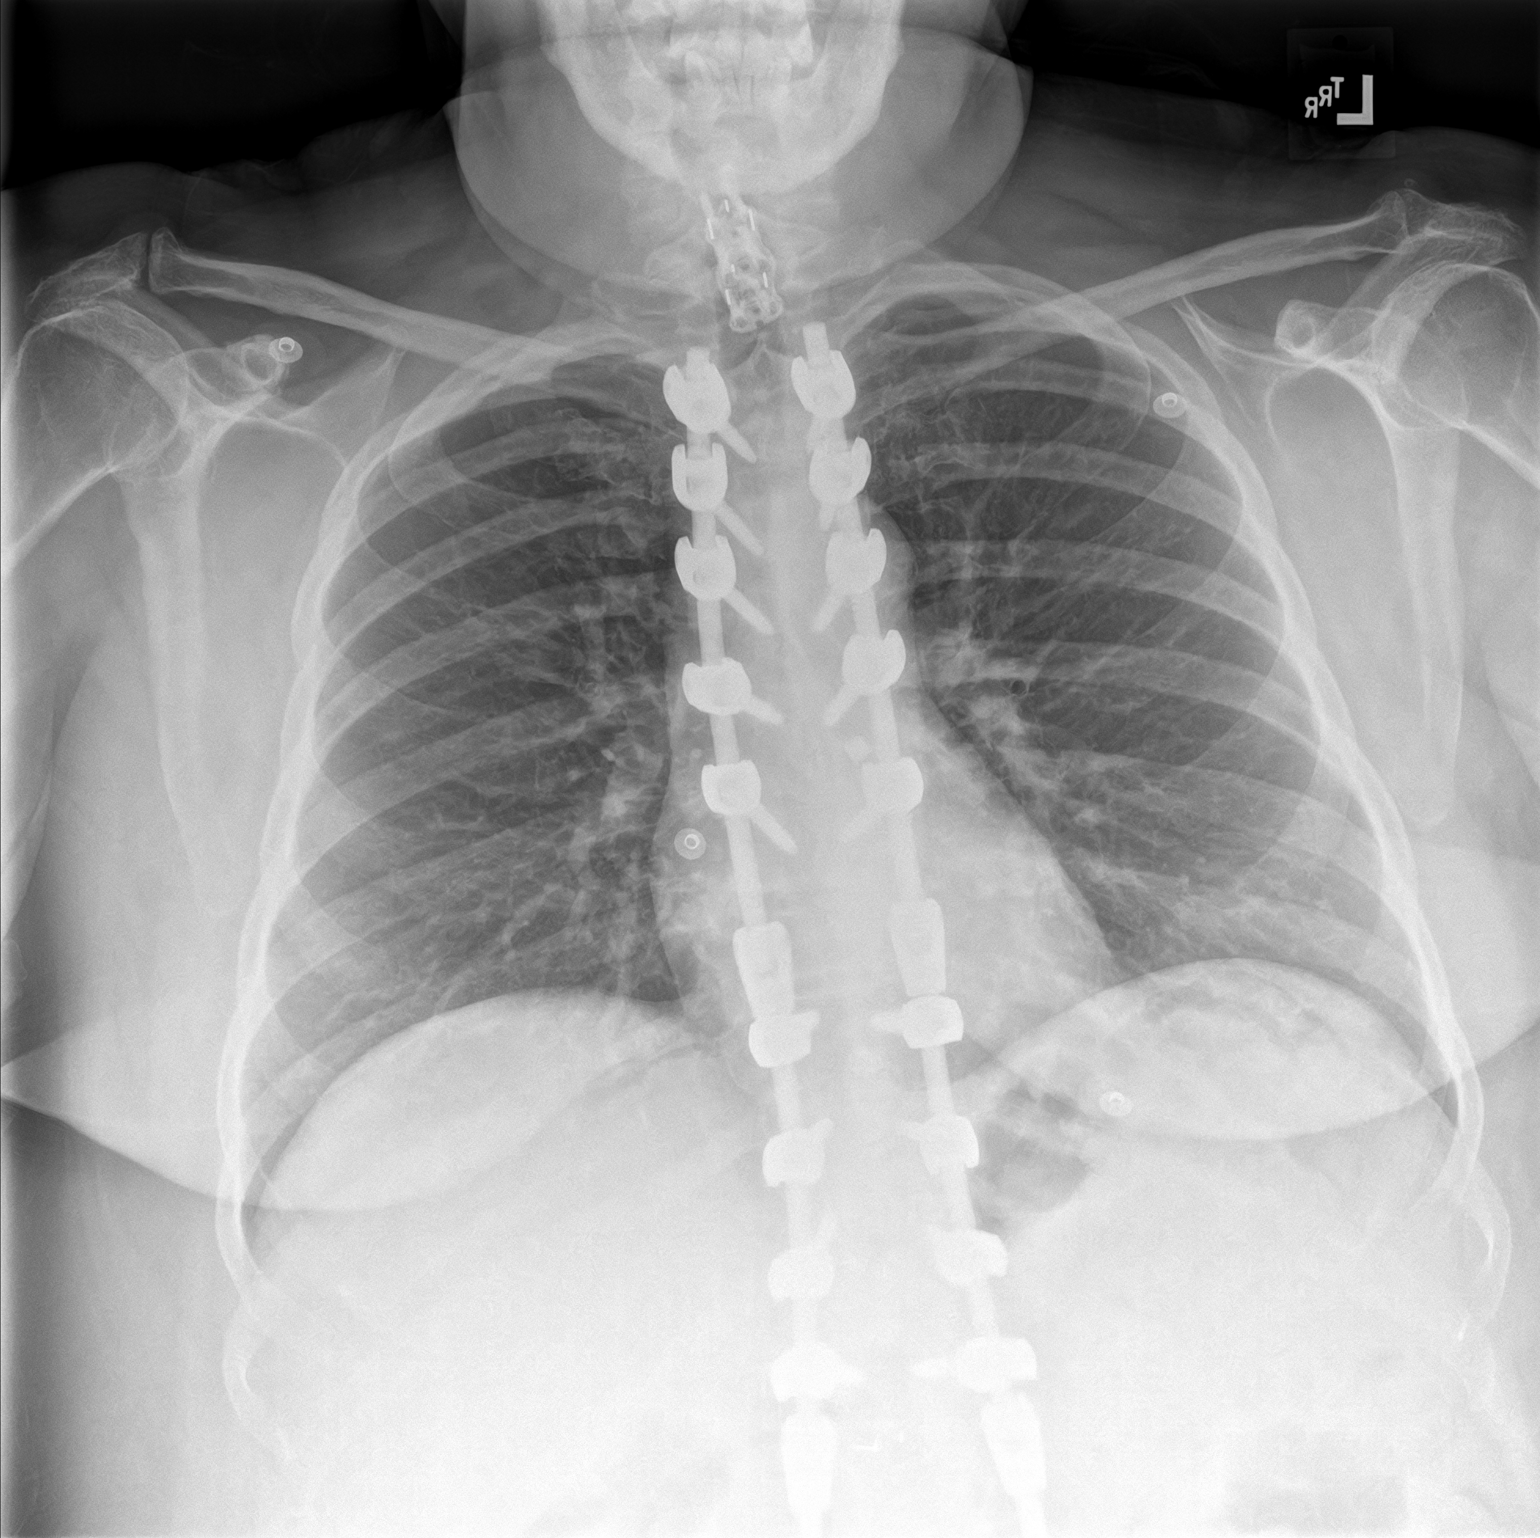
[im 2/2]
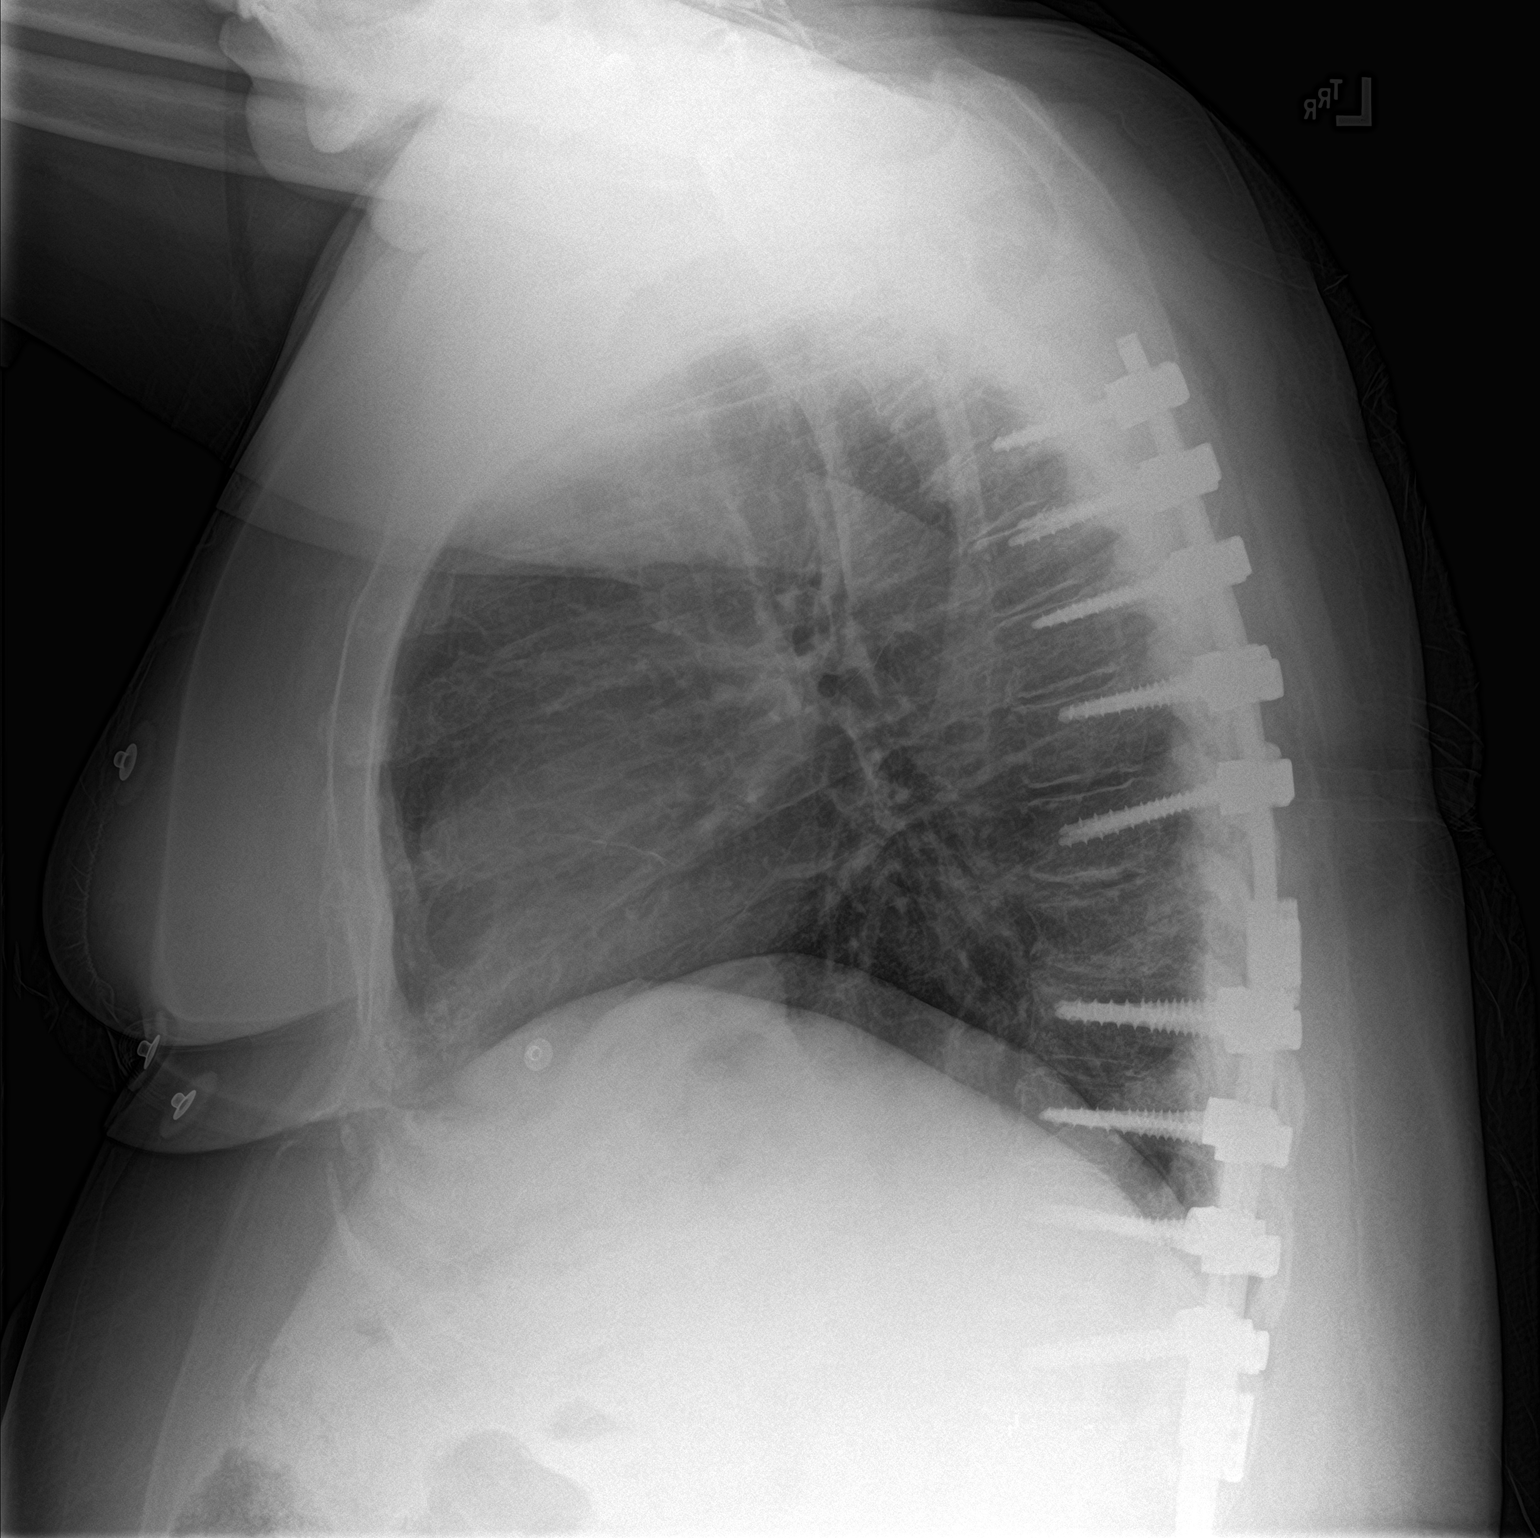

[2 of 2 positions shown; findings below may reference images not displayed]

FINDINGS: Frontal and lateral views of the chest demonstrate an unremarkable
cardiac silhouette. Stable postsurgical changes from thoracolumbar
spinal fusion. ACDF at the cervicothoracic junction. No acute bony
abnormalities. No airspace disease, effusion, or pneumothorax.
IMPRESSION: 1. No acute intrathoracic process.

## 2020-11-23 IMAGING — CT CT HEAD W/O CM
3 series · 16 of 47 positions shown, 19 images · non-contrast
Comparison: Head CT dated 01/18/2015.

CLINICAL DATA: 53-year-old female with syncope.

EXAM:
CT HEAD WITHOUT CONTRAST
TECHNIQUE: Contiguous axial images were obtained from the base of the skull
through the vertex without intravenous contrast.

[Series 2: head wo · axial · 0.41mm/px · z∈[-171,-41]mm · 10 of 32 slices shown, 13 images]
[im 3/32  brain]
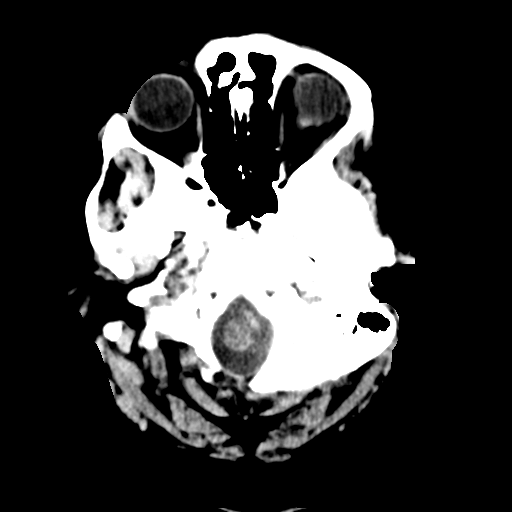
[im 3/32  bone]
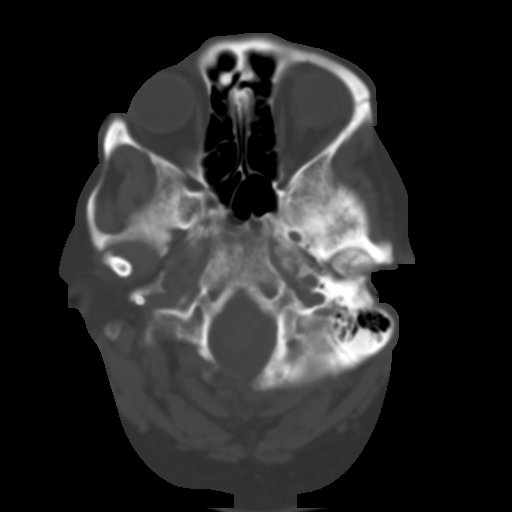
[im 6/32  brain]
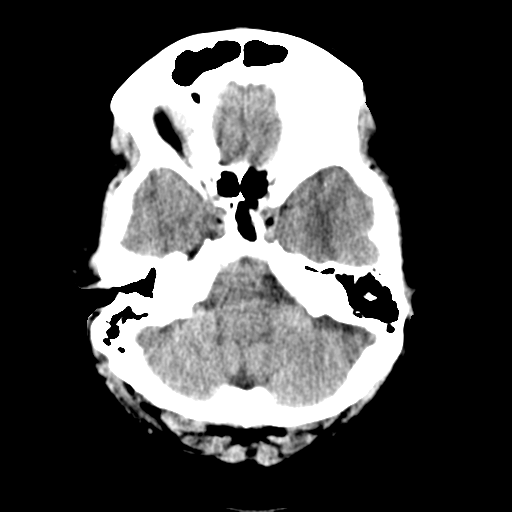
[im 9/32  brain]
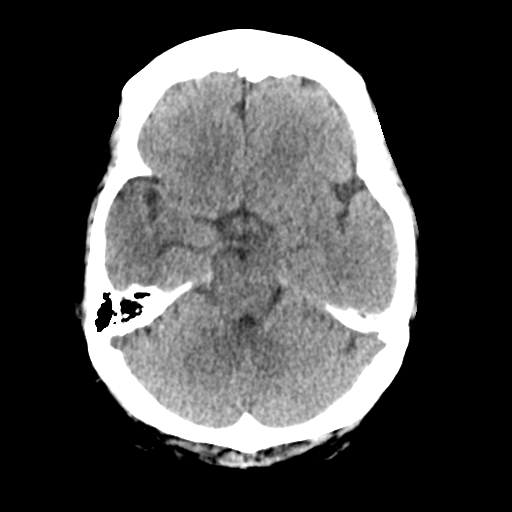
[im 11/32  brain]
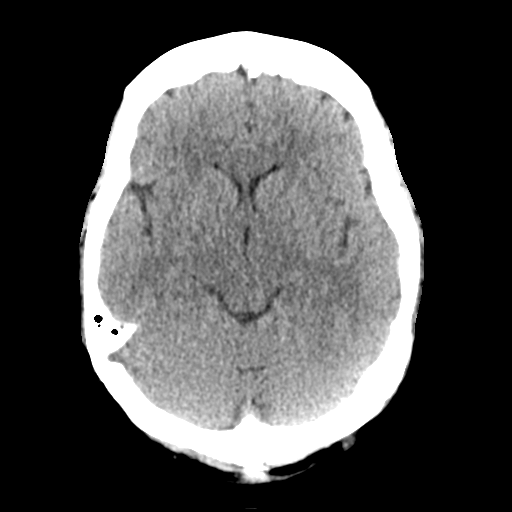
[im 14/32  brain]
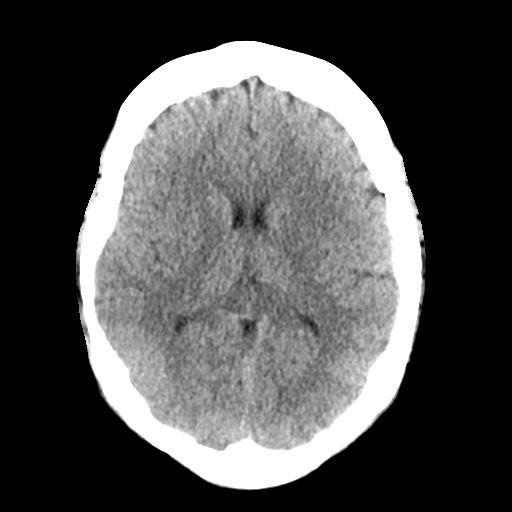
[im 14/32  bone]
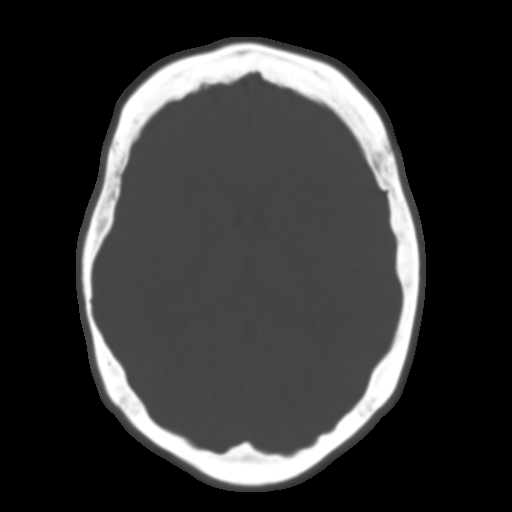
[im 18/32  brain]
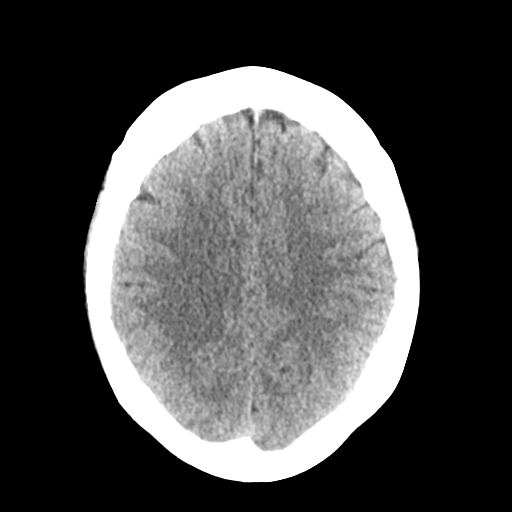
[im 21/32  brain]
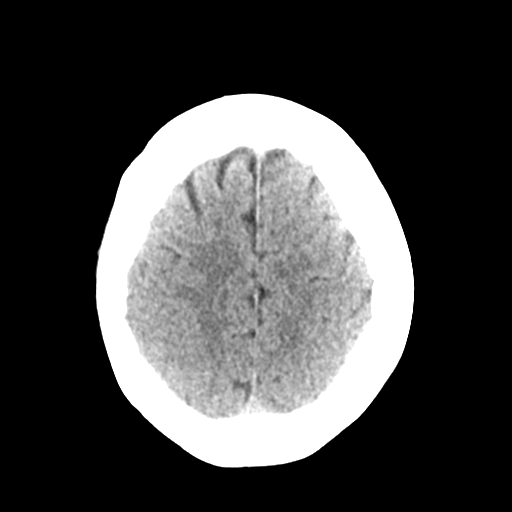
[im 24/32  brain]
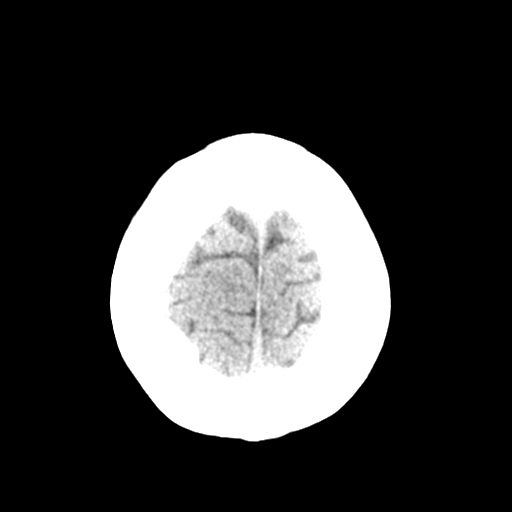
[im 26/32  brain]
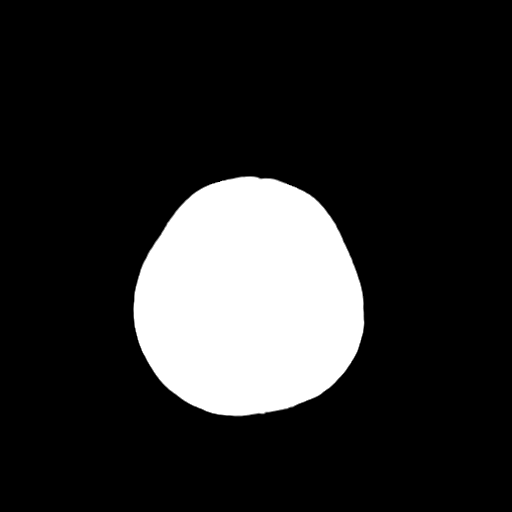
[im 26/32  bone]
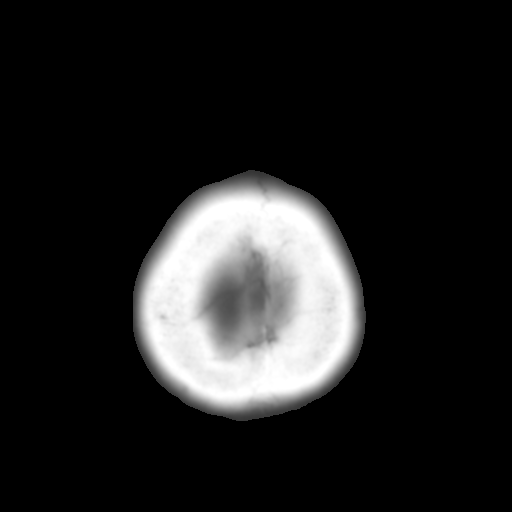
[im 29/32  brain]
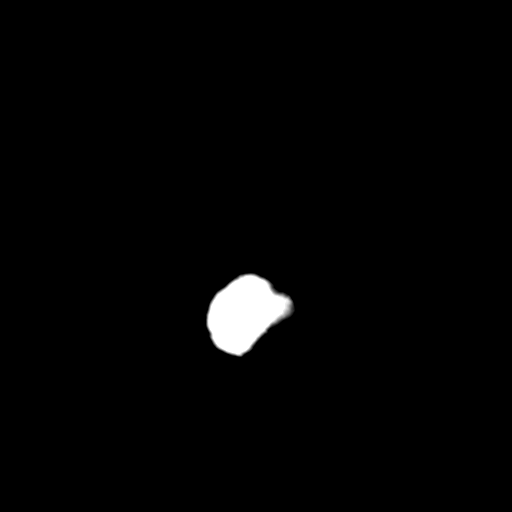

[Series 4: coronal soft tissue · coronal · 0.33mm/px · 3 of 66 slices shown]
[im 22/66  brain]
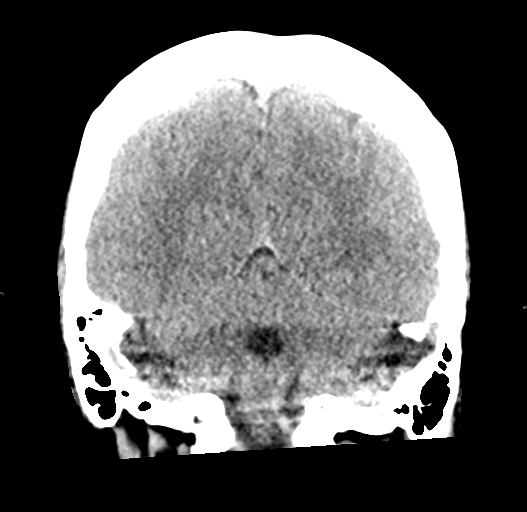
[im 29/66  brain]
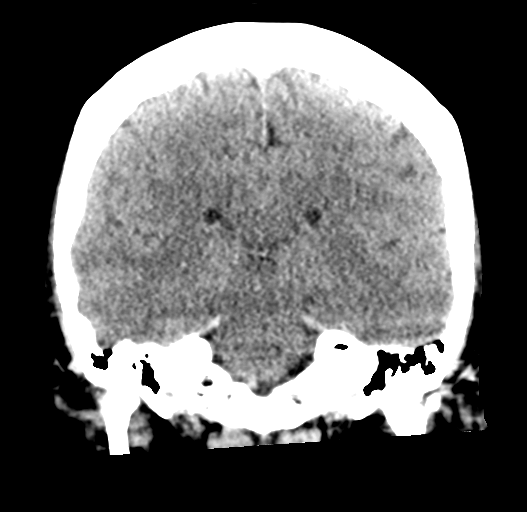
[im 37/66  brain]
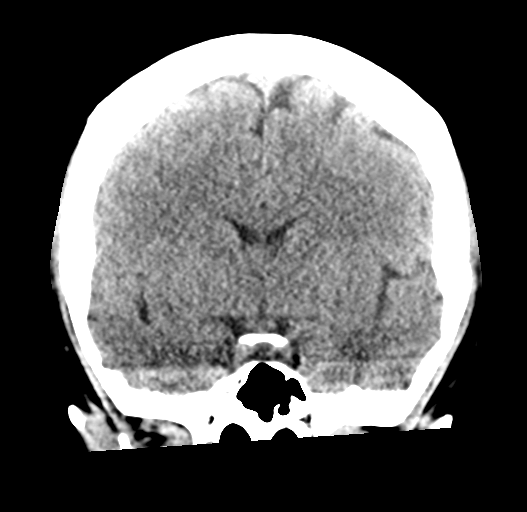

[Series 5: sagittal soft tissue · sagittal · 0.33mm/px · 3 of 52 slices shown]
[im 18/52  brain]
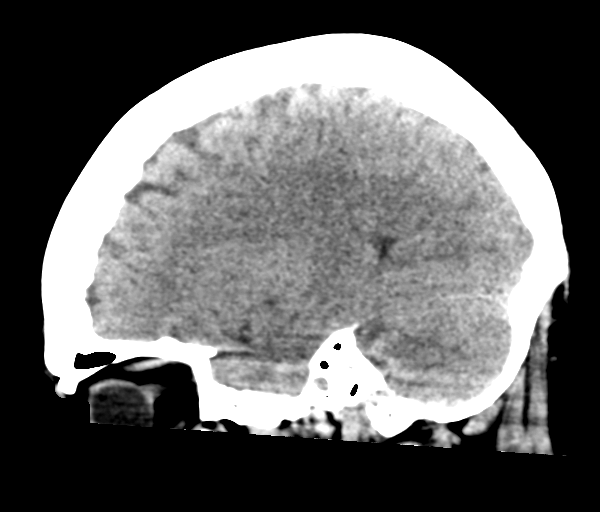
[im 26/52  brain]
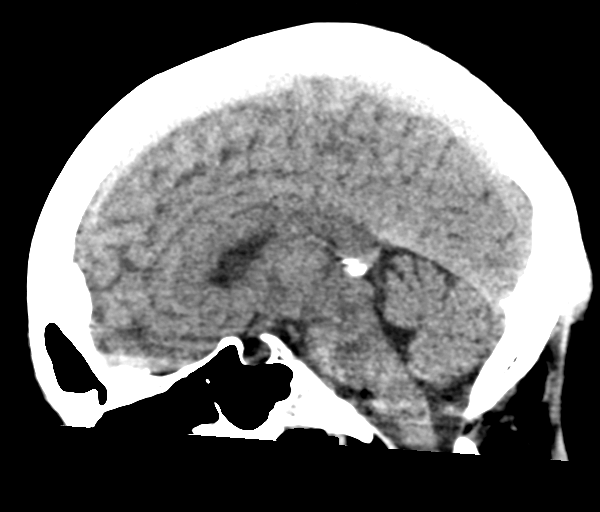
[im 35/52  brain]
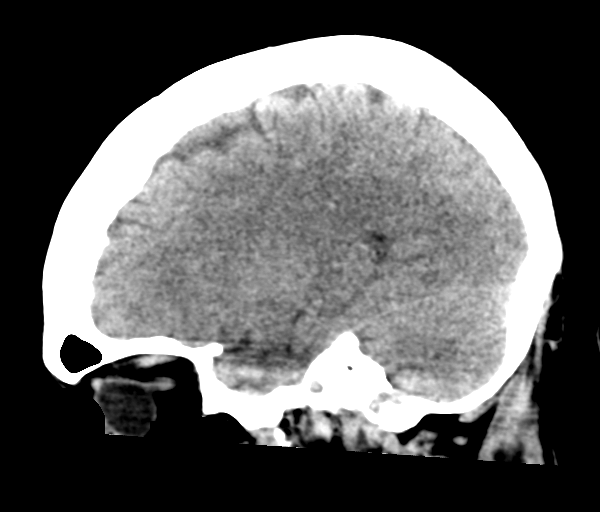

[16 of 47 positions shown; findings below may reference images not displayed]

FINDINGS: Brain: The ventricles and sulci appropriate size for patient's age.
The gray-white matter discrimination is preserved. There is no acute
intracranial hemorrhage. No mass effect and shift. No extra-axial
fluid collection.

Vascular: No hyperdense vessel or unexpected calcification.

Skull: Normal. Negative for fracture or focal lesion.

Sinuses/Orbits: No acute finding.

Other: None
IMPRESSION: Unremarkable noncontrast CT of the brain.

## 2020-11-23 IMAGING — DX DG FOOT COMPLETE 3+V*L*
3 series · 3 of 3 positions shown · non-contrast
Comparison: 05/09/2018

CLINICAL DATA: Infection of left great toe.

EXAM:
LEFT FOOT - COMPLETE 3+ VIEW

[foot ap]
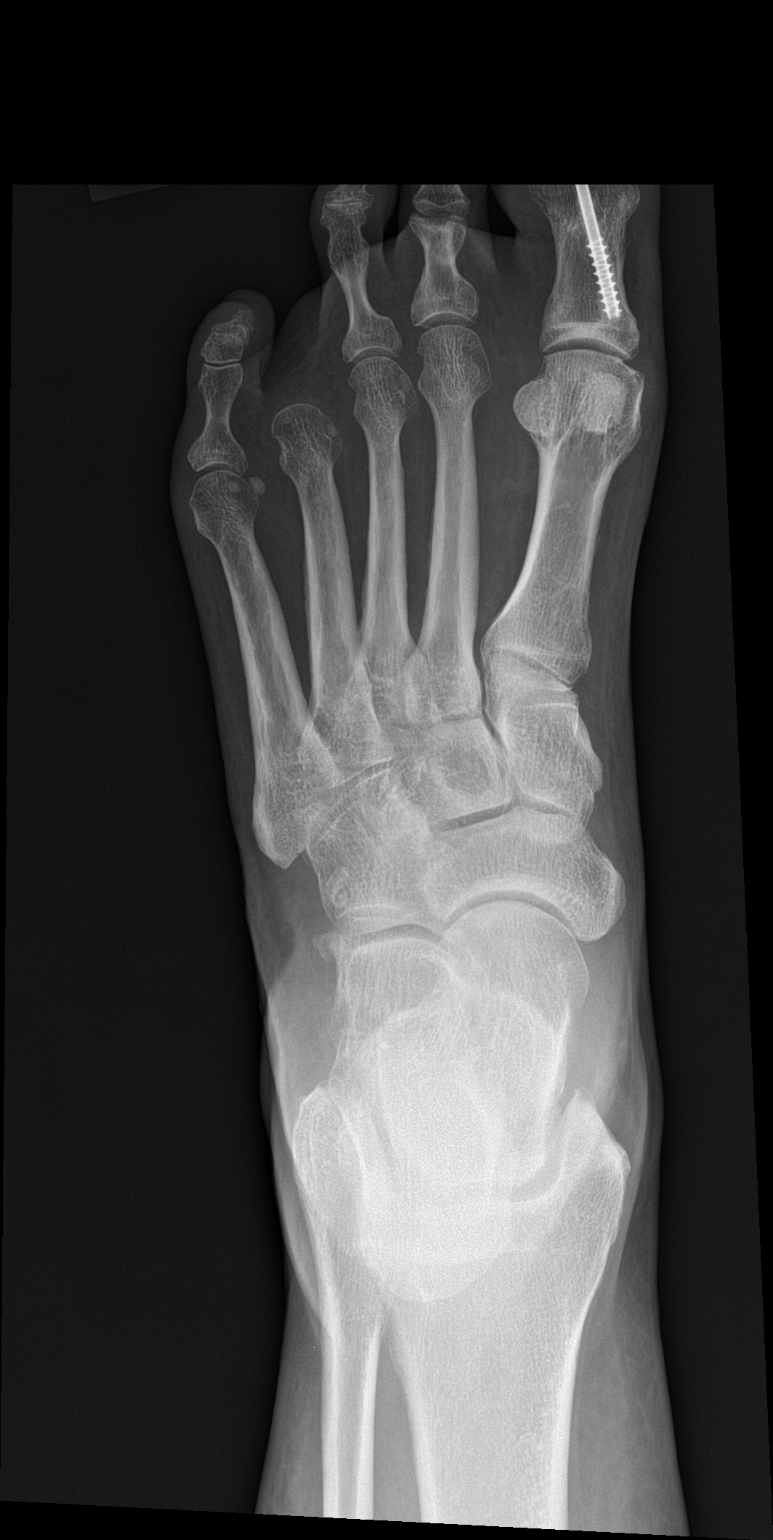

[foot obl]
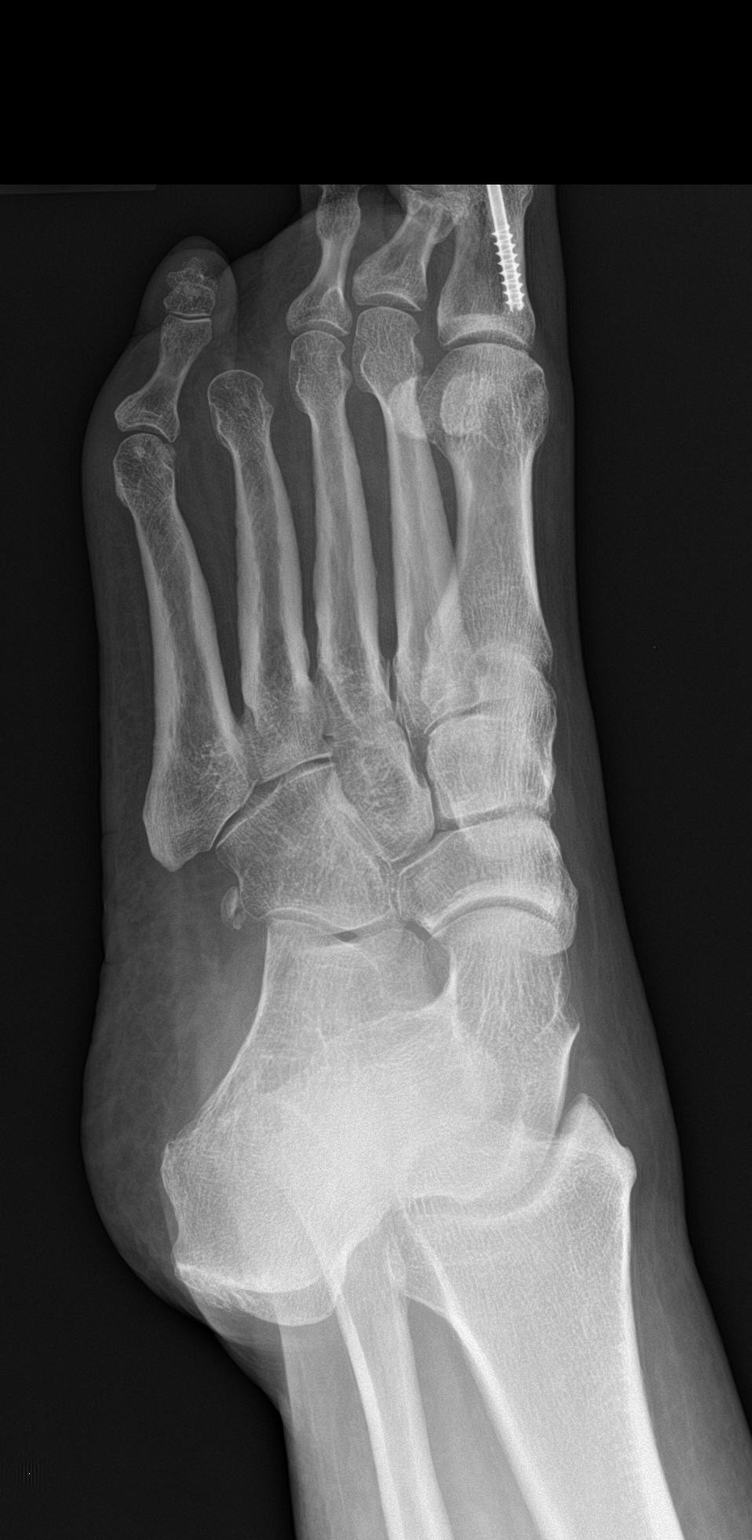

[foot lat]
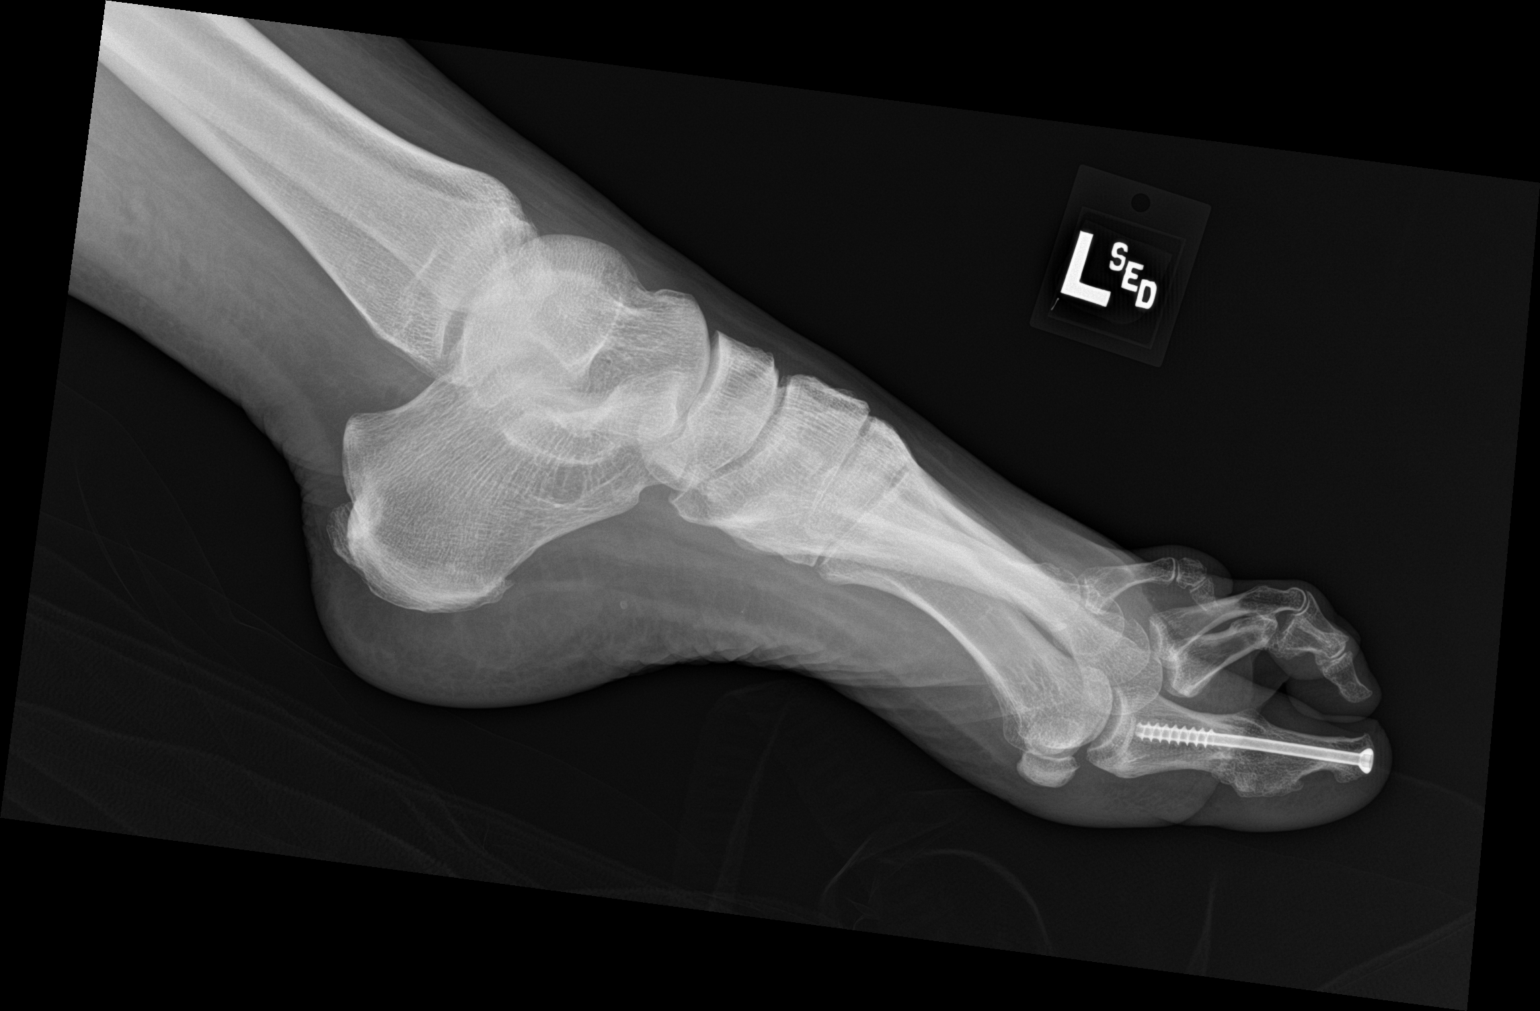

[3 of 3 positions shown; findings below may reference images not displayed]

FINDINGS: There is no evidence of fracture or dislocation. No evidence of
osteolysis or periostitis. There is no evidence of arthropathy or
other focal bone lesion. Fusion bony fusion of the PIP joint of the
middle stone is also seen. Previous amputation of the 4th toe is
again noted. Old fracture deformity of the proximal phalanx of the
2nd toe is unchanged. No significant changes seen compared to prior
exam.
IMPRESSION: No radiographic evidence of osteomyelitis or other acute findings.

## 2020-11-23 IMAGING — DX DG HAND COMPLETE 3+V*L*
3 series · 3 of 3 positions shown · non-contrast
Comparison: None.

CLINICAL DATA: Initial evaluation for acute hand swelling. Possible
injury.

EXAM:
LEFT HAND - COMPLETE 3+ VIEW

[hand ap]
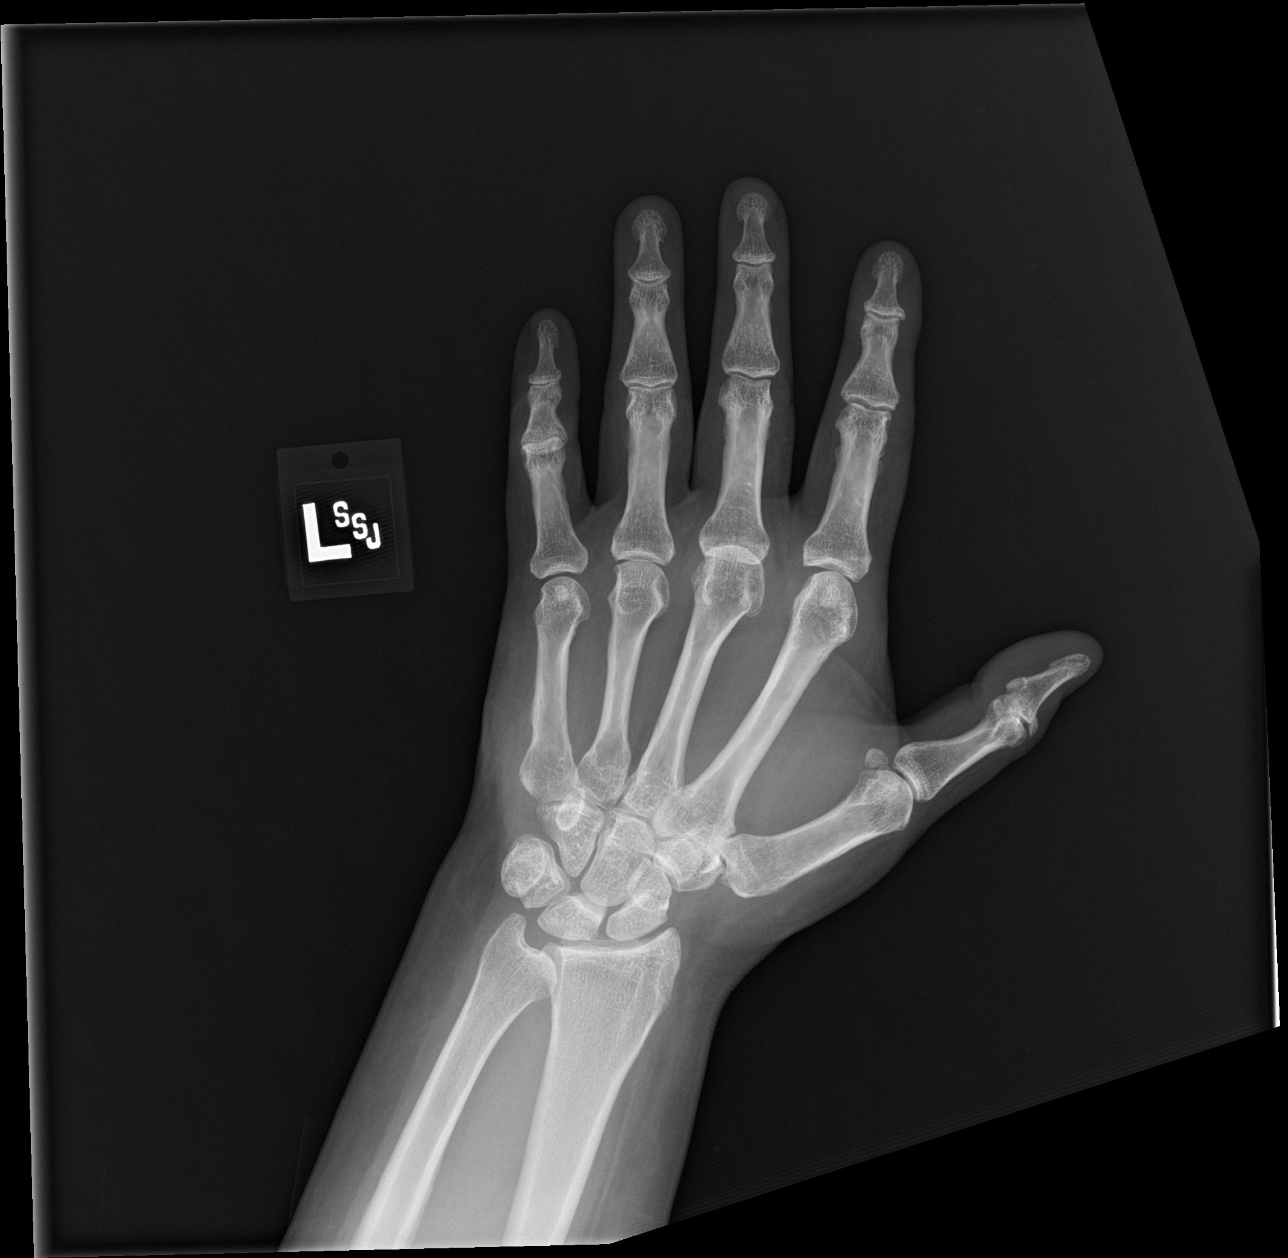

[hand obl]
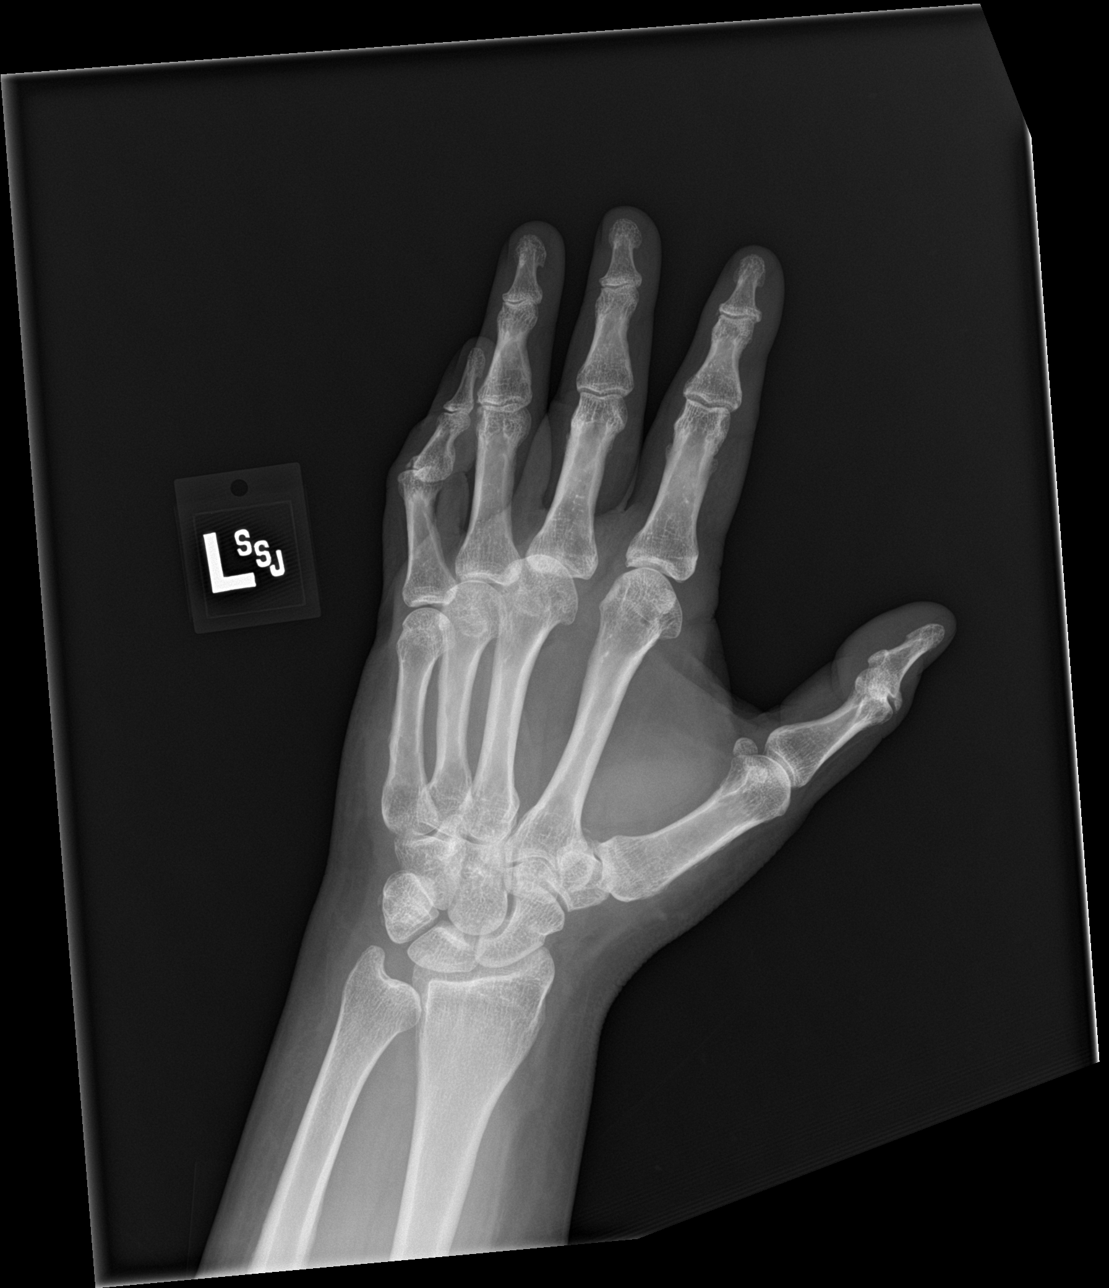

[hand lat]
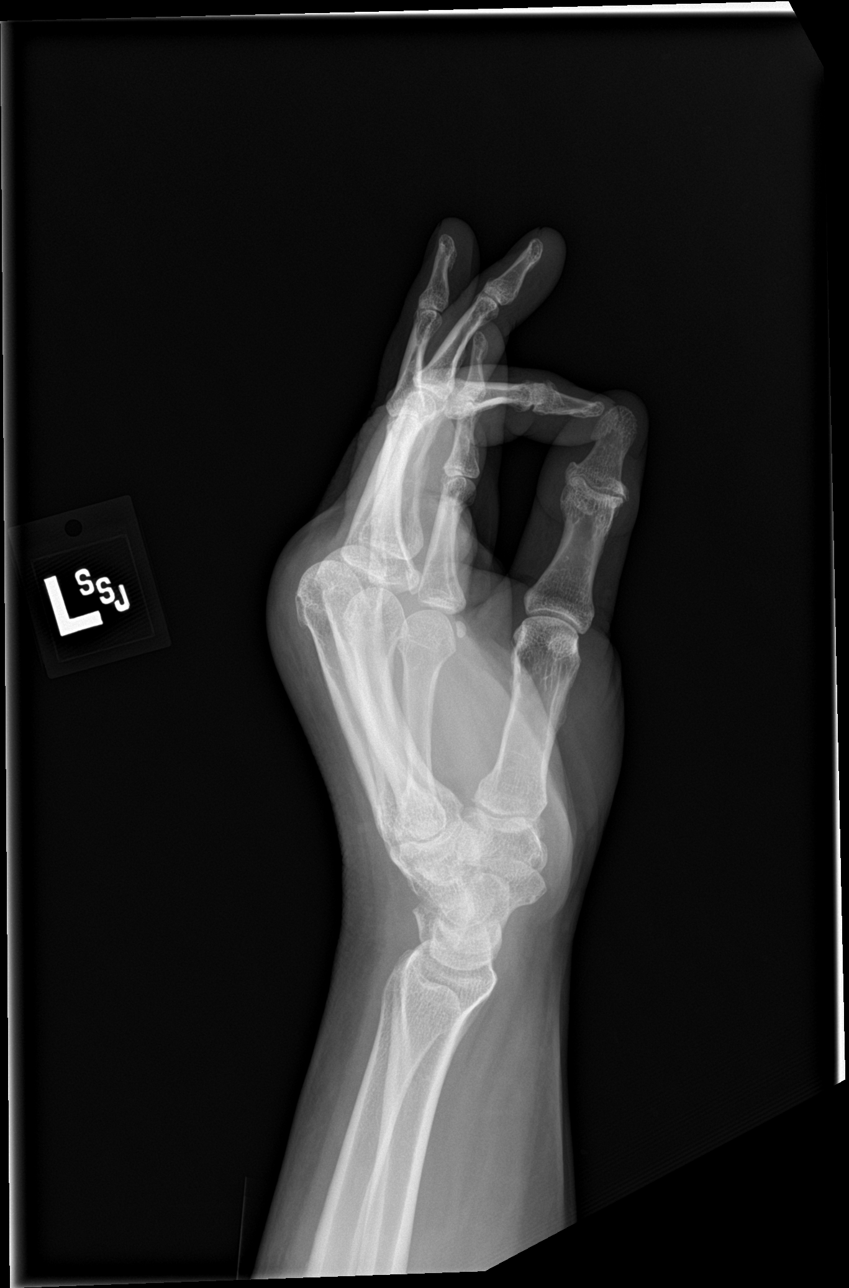

[3 of 3 positions shown; findings below may reference images not displayed]

FINDINGS: No acute fracture dislocation. Prominent soft tissue swelling
present over the dorsum of the hand moderate osteoarthritic changes
present throughout the hand, most notable at the first IP and fifth
PIP joints. Osseous mineralization normal.
IMPRESSION: 1. No acute osseous abnormality.
2. Prominent soft tissue swelling over the eye the dorsal aspect of
the hand.
3. Moderate degenerative osteoarthritic changes throughout the hand.

## 2020-11-24 ENCOUNTER — Other Ambulatory Visit: Payer: Self-pay

## 2020-11-24 ENCOUNTER — Ambulatory Visit (INDEPENDENT_AMBULATORY_CARE_PROVIDER_SITE_OTHER): Payer: Medicare Other | Admitting: Podiatry

## 2020-11-24 DIAGNOSIS — L97512 Non-pressure chronic ulcer of other part of right foot with fat layer exposed: Secondary | ICD-10-CM

## 2020-11-25 ENCOUNTER — Telehealth: Payer: Self-pay | Admitting: Cardiovascular Disease

## 2020-11-25 ENCOUNTER — Other Ambulatory Visit: Payer: Self-pay | Admitting: Cardiovascular Disease

## 2020-11-25 MED ORDER — METOPROLOL SUCCINATE ER 25 MG PO TB24
ORAL_TABLET | ORAL | 0 refills | Status: DC
Start: 2020-11-25 — End: 2020-12-25

## 2020-11-25 NOTE — Telephone Encounter (Signed)
*  STAT* If patient is at the pharmacy, call can be transferred to refill team.   1. Which medications need to be refilled? (please list name of each medication and dose if known)  metoprolol succinate (TOPROL-XL) 25 MG 24 hr tablet  2. Which pharmacy/location (including street and city if local pharmacy) is medication to be sent to? [facility] [place], [address]  3. Do they need a 30 day or 90 day supply? 30 with refills

## 2020-11-27 ENCOUNTER — Other Ambulatory Visit: Payer: Self-pay

## 2020-11-27 ENCOUNTER — Ambulatory Visit (INDEPENDENT_AMBULATORY_CARE_PROVIDER_SITE_OTHER): Payer: Medicare Other | Admitting: Podiatry

## 2020-11-27 DIAGNOSIS — L97512 Non-pressure chronic ulcer of other part of right foot with fat layer exposed: Secondary | ICD-10-CM | POA: Diagnosis not present

## 2020-12-01 DIAGNOSIS — M170 Bilateral primary osteoarthritis of knee: Secondary | ICD-10-CM | POA: Diagnosis not present

## 2020-12-01 DIAGNOSIS — M533 Sacrococcygeal disorders, not elsewhere classified: Secondary | ICD-10-CM | POA: Diagnosis not present

## 2020-12-01 DIAGNOSIS — M47816 Spondylosis without myelopathy or radiculopathy, lumbar region: Secondary | ICD-10-CM | POA: Diagnosis not present

## 2020-12-01 DIAGNOSIS — G894 Chronic pain syndrome: Secondary | ICD-10-CM | POA: Diagnosis not present

## 2020-12-01 NOTE — Progress Notes (Signed)
Subjective: [name] [name] is a 55 y.o. is seen today in office s/p right foot ulcer excision, graft application preformed on October 25, 2020.  She presents today for graft application as well as for possible total contact cast.  States that she has been doing well any increasing swelling or redness that she reports.  No pain although she does have neuropathy.  She denies any fevers or chills.  She has no other concerns.    Objective: General: No acute distress, AAOx3  DP/PT pulses palpable 2/4, CRT < 3 sec to all digits.  Right foot: Dressing clean, dry, intact.  Wound today after debridement measures 1 x 0.9 x 0.5 cm.  Prior to debridement the wound was slightly smaller at 1 x 0.7 x 0.1 cm.  After debridement there is a granular wound base but any probing to bone, undermining or tunneling.  Faint edema.  No cellulitis identified.  The erythema noted in the pictures from the skin had peeled and this is new skin present.  No fluctuance or crepitation.  No malodor. No pain with calf compression, swelling, warmth, erythema.      Assessment and Plan:  Status post right foot ulcer excision, graft application, doing well with no complications   Today we discussed in depth the findings. All questions were answered. Excisional debridement performed through subcutaneous tissue with (scalpel, curette) to remove all necrotic tissue and biofilm to help promote wound healing.  Fatty layer was exposed. Bleeding was controlled with direct pressure. Tolerated the procedure well.   Affinity was used today to promote granulation tissue, inhibit bioburden and support wound healing.  7 units utilized and 0 units was wasted today.  It is then affixed and dressing applied.  Continue offloading in the boot/compression.  Total contact cast was applied.  I will see her back later in the week to change the cast to make sure is no irritation.  Reevaluation will take place in one week.   Post-procedure instructions discussed  and no further questions noted.   [name] DPM

## 2020-12-02 ENCOUNTER — Ambulatory Visit (INDEPENDENT_AMBULATORY_CARE_PROVIDER_SITE_OTHER): Payer: Medicare Other | Admitting: Podiatry

## 2020-12-02 ENCOUNTER — Other Ambulatory Visit: Payer: Self-pay

## 2020-12-02 DIAGNOSIS — L97512 Non-pressure chronic ulcer of other part of right foot with fat layer exposed: Secondary | ICD-10-CM

## 2020-12-02 IMAGING — CR DG HIP (WITH OR WITHOUT PELVIS) 2-3V*L*
1 series · 3 of 3 positions shown · non-contrast
Comparison: None.

CLINICAL DATA: Status post fall.

EXAM:
DG HIP (WITH OR WITHOUT PELVIS) 2-3V LEFT

[Series 1: dg hip unilat w or w/o pelvis 2-3 views  · non-contrast · 0.14mm/px · 3 of 3 slices shown]
[im 1/3]
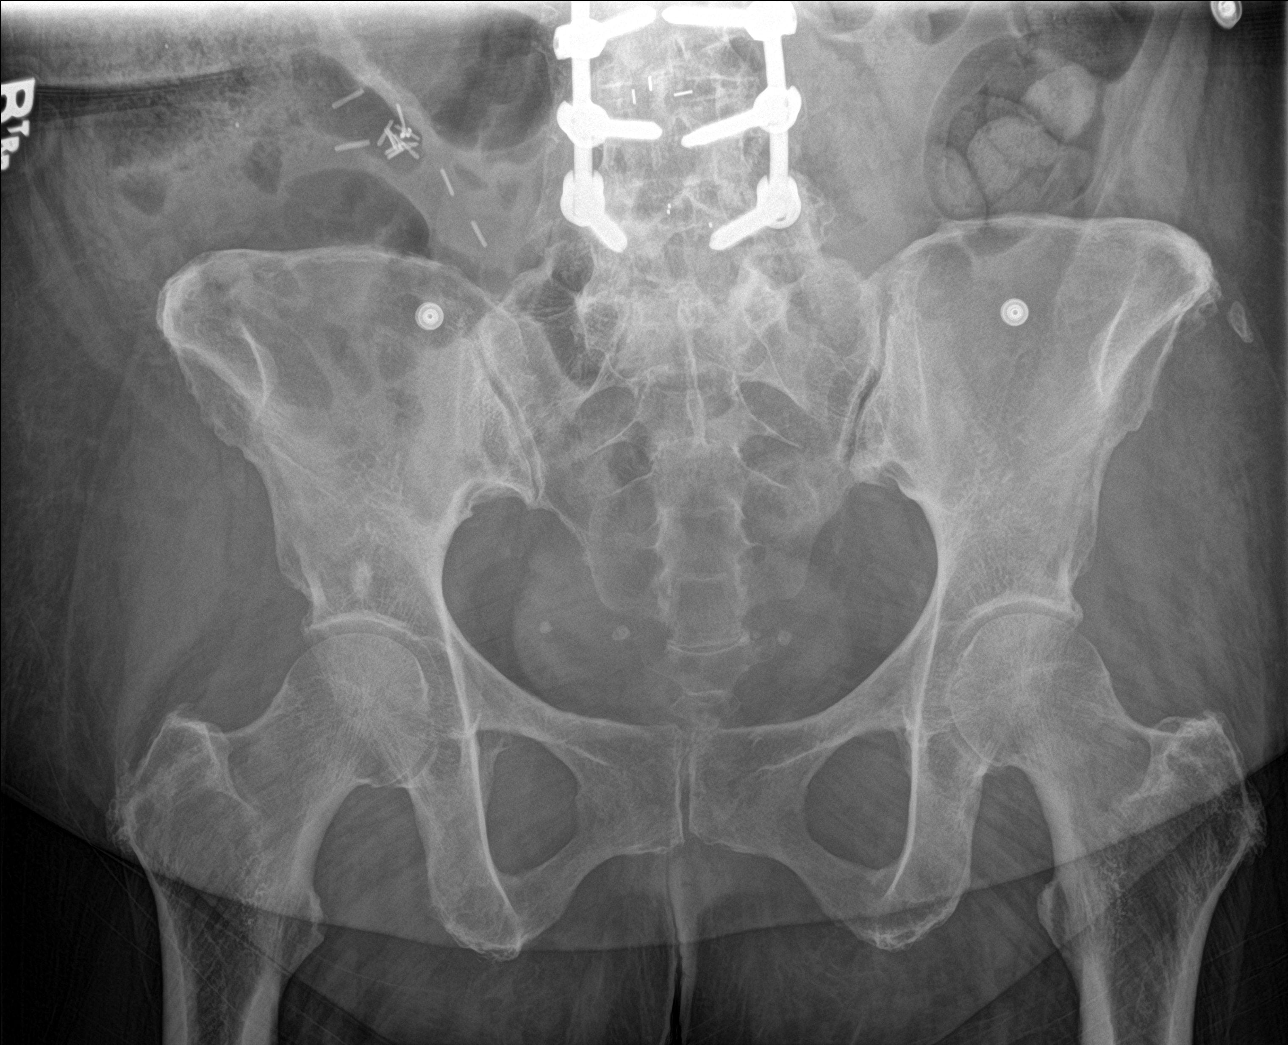
[im 2/3]
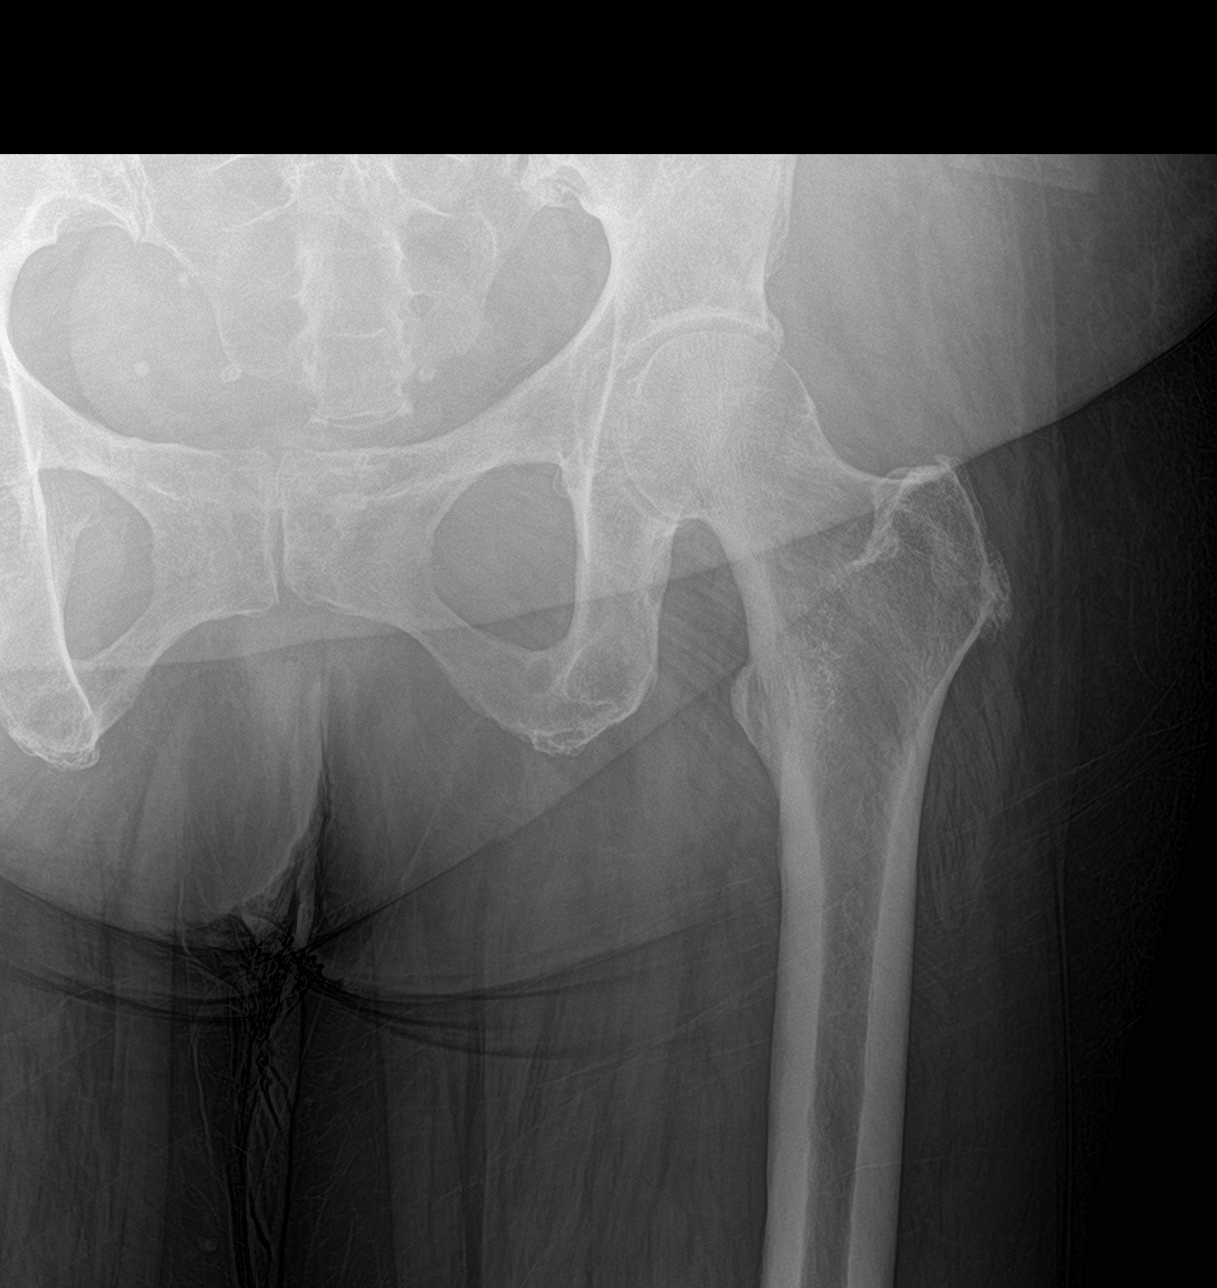
[im 3/3]
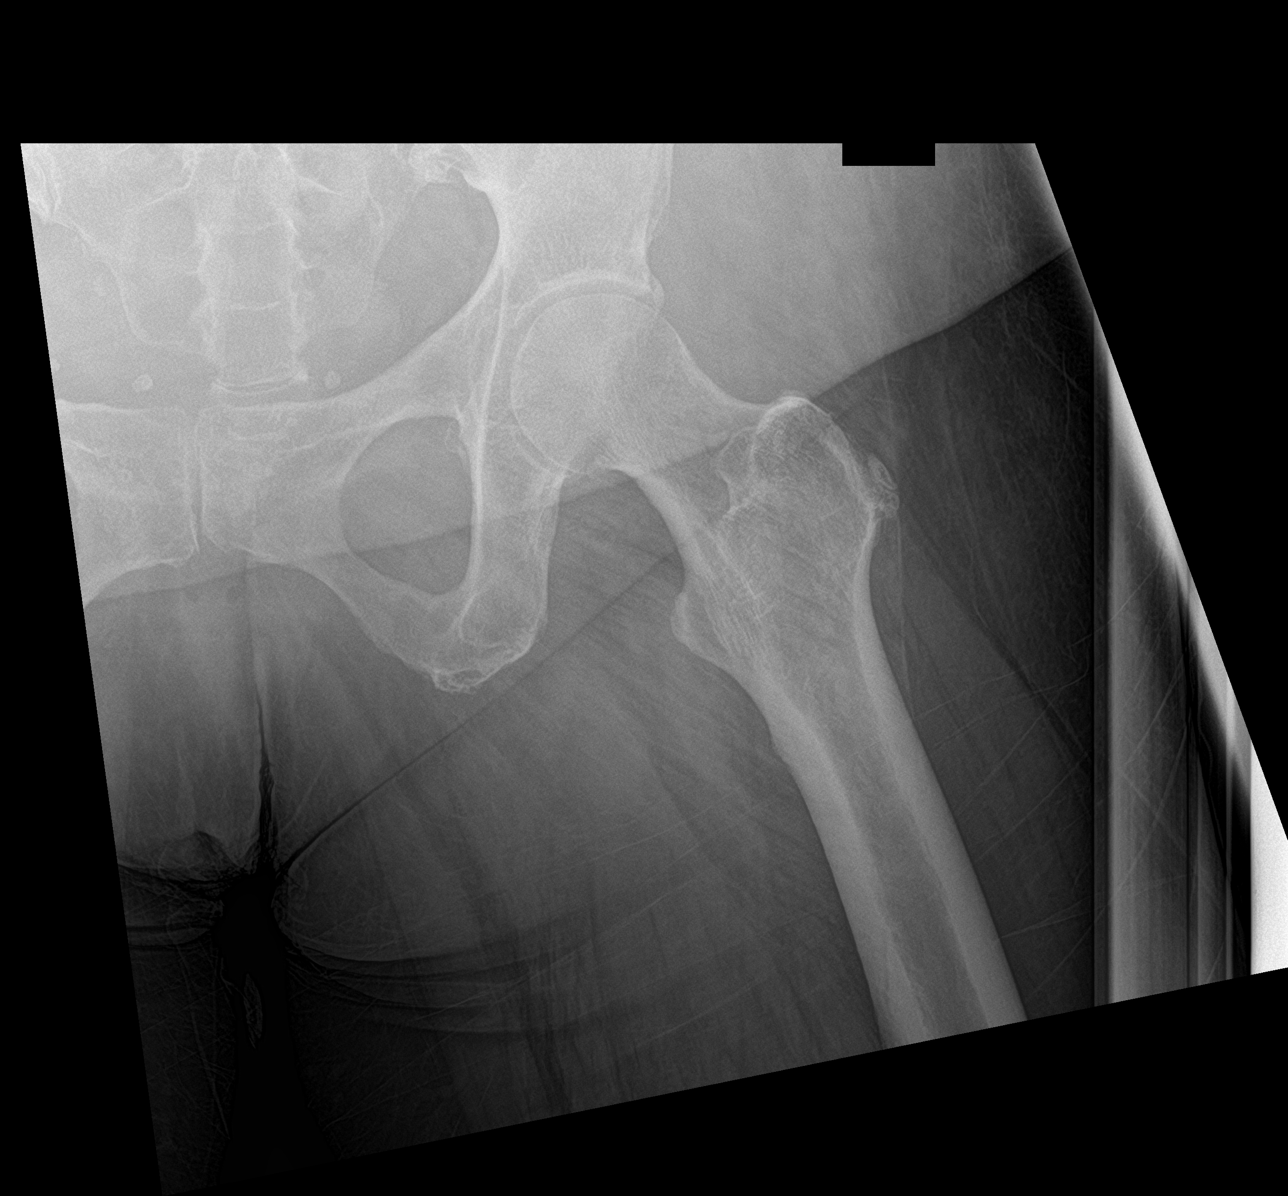

[3 of 3 positions shown; findings below may reference images not displayed]

FINDINGS: There is no evidence of hip fracture or dislocation. A 1.3 cm bone
island is seen just above the right acetabulum. Bilateral pedicle
screws are seen within the visualized portion of the lumbar spine.
These involve the levels of L3, L4 and L5 vertebral bodies. Small
phleboliths are seen within the pelvis with radiopaque surgical
clips seen overlying the right lower quadrant.
IMPRESSION: No acute osseous abnormality.

## 2020-12-02 IMAGING — CR DG THORACIC SPINE 2V
1 series · 3 of 3 positions shown · non-contrast
Comparison: None.

CLINICAL DATA: Status post fall.

EXAM:
THORACIC SPINE 2 VIEWS

[Series 1: dg thoracic spine 2 view · 0.14mm/px · 3 of 3 slices shown]
[im 1/3]
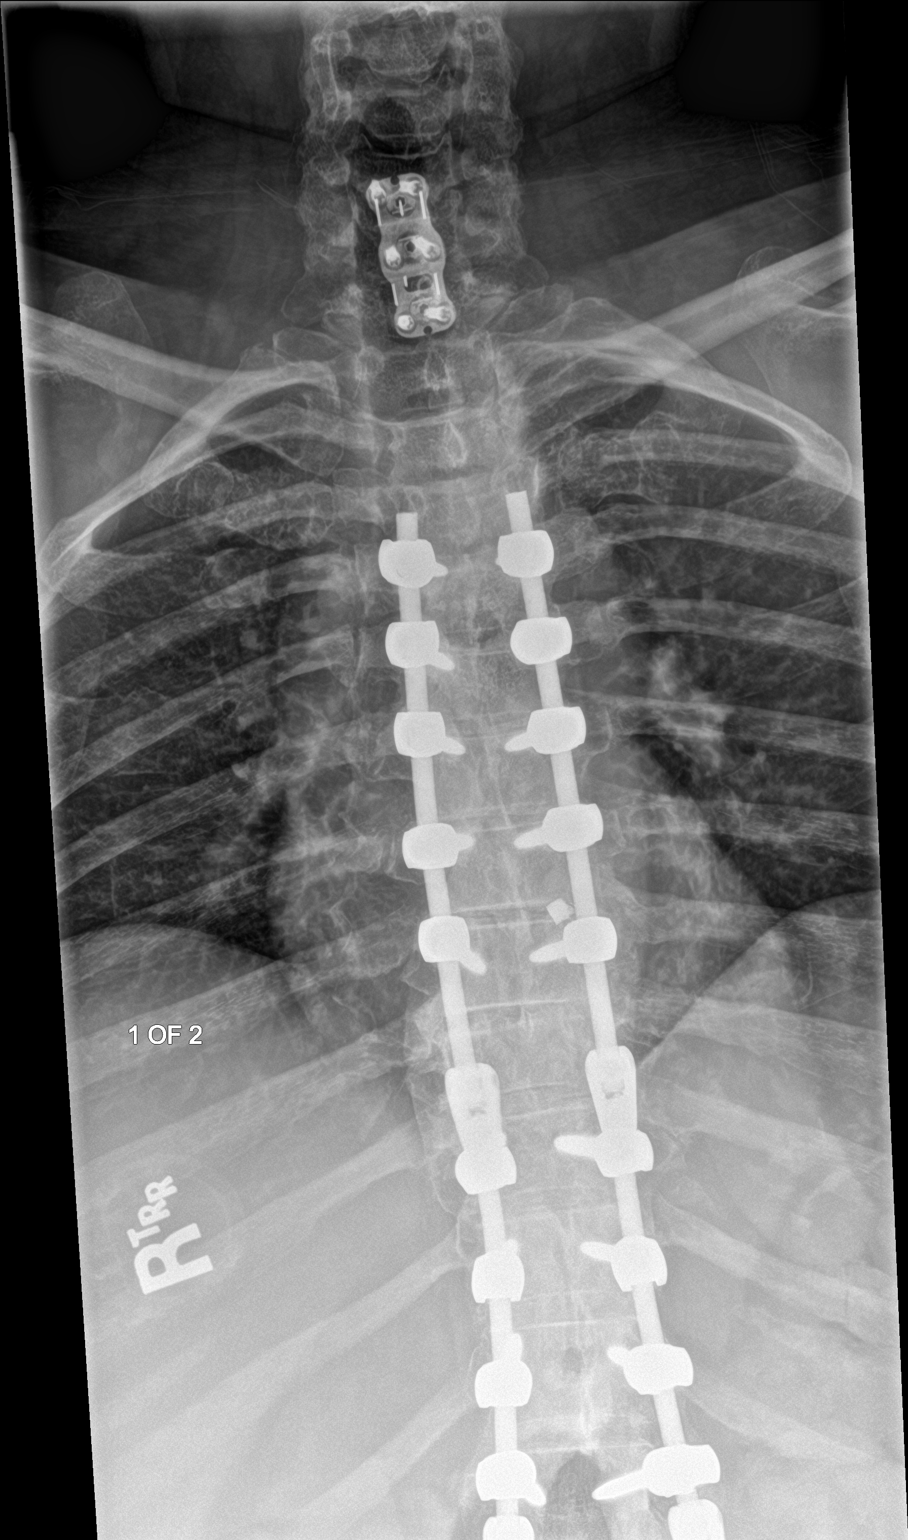
[im 2/3]
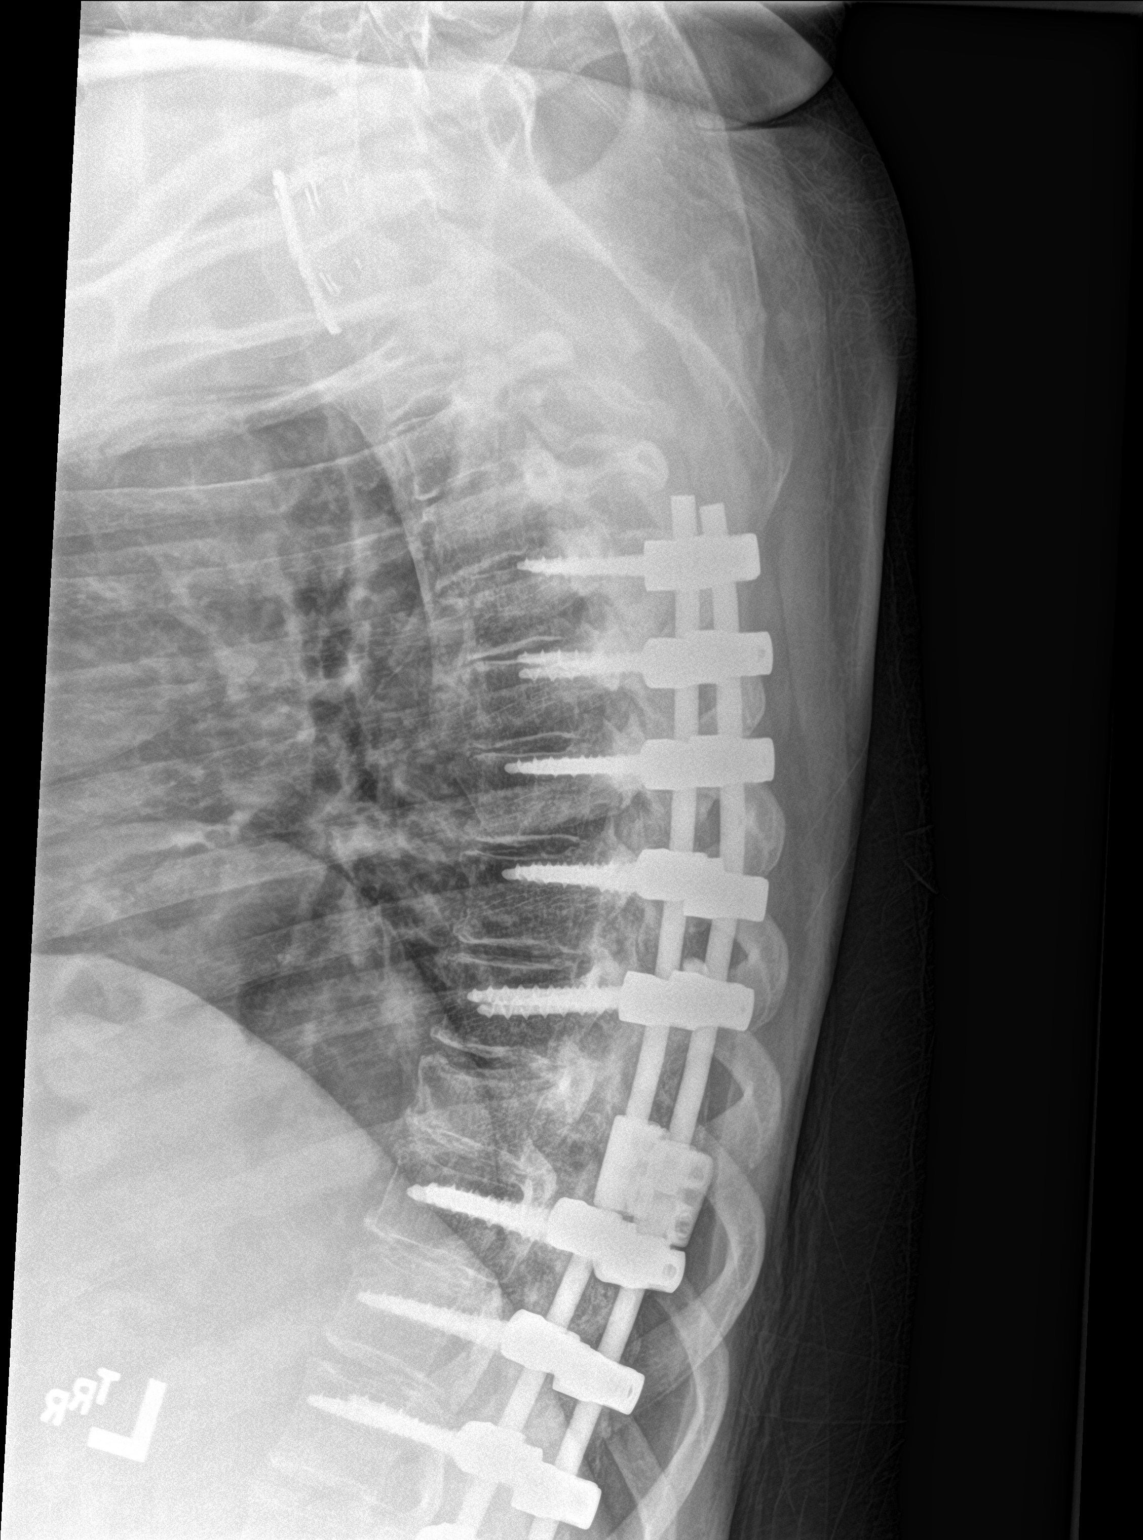
[im 3/3]
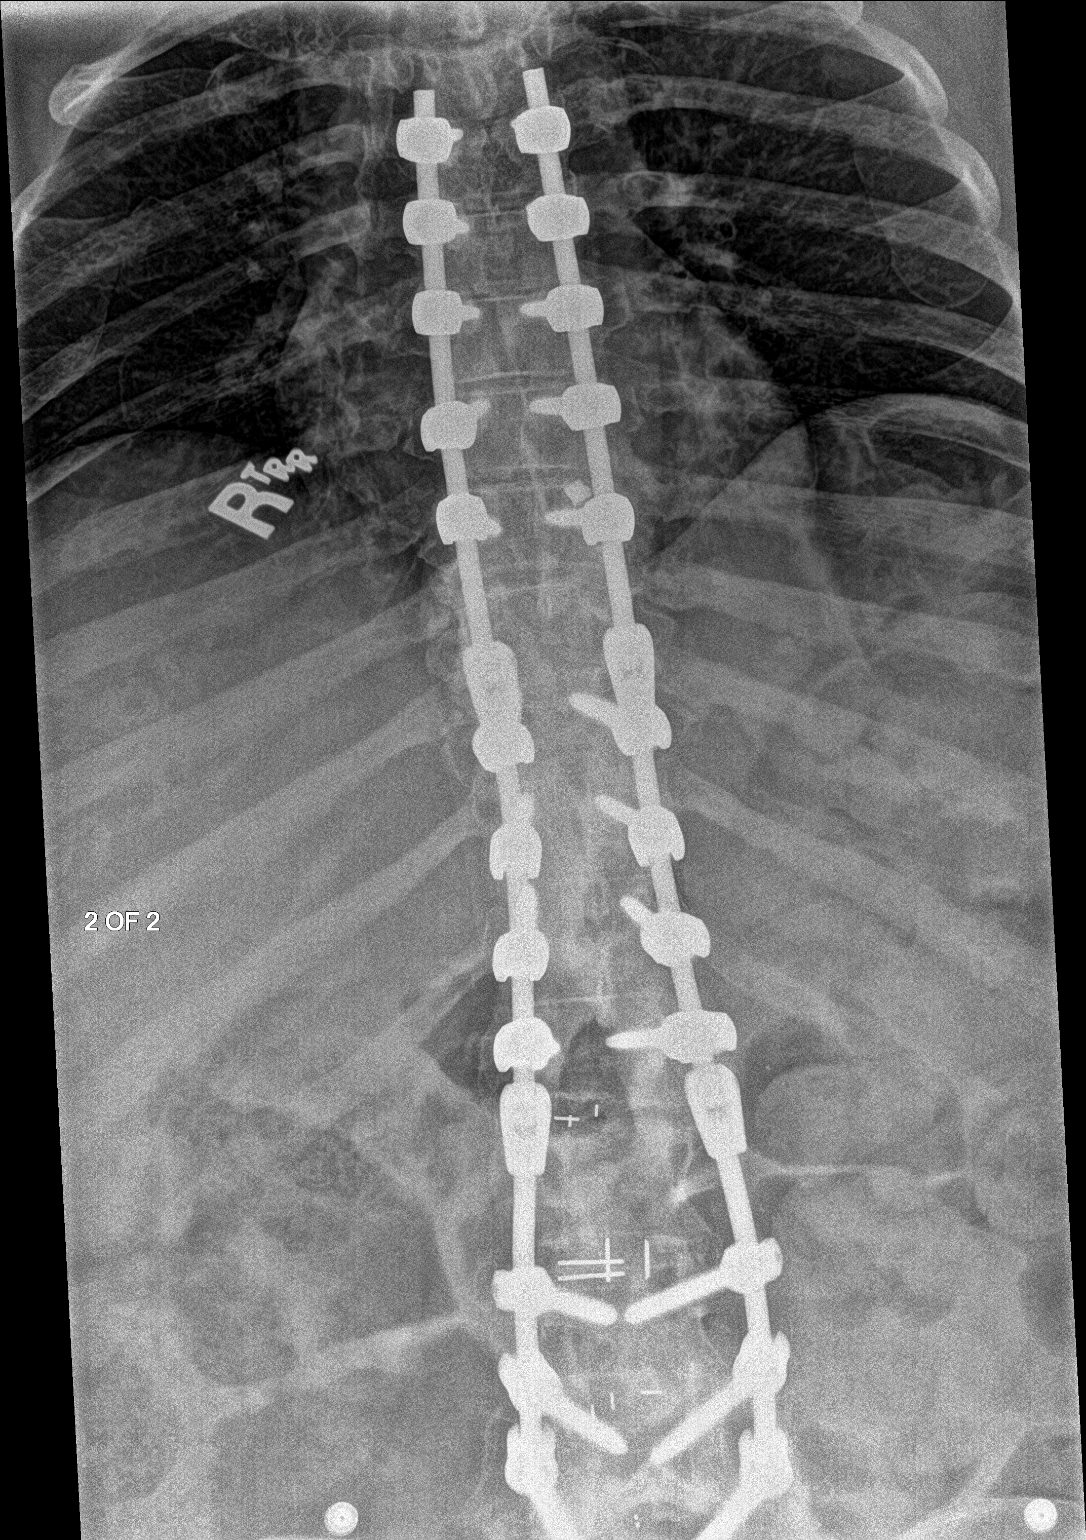

[3 of 3 positions shown; findings below may reference images not displayed]

FINDINGS: There is no evidence of acute thoracic spine fracture. Bilateral
radiopaque pedicle screws are seen from the level of T4 through L5.
A radiopaque fusion plate and screws are seen along the anterior
aspect of the C6 and C7 vertebral bodies. Alignment is normal. No
other significant bone abnormalities are identified.
IMPRESSION: Extensive postoperative changes within the thoracic and lumbar
spine, without evidence of an acute osseous abnormality.

## 2020-12-02 IMAGING — CR DG LUMBAR SPINE COMPLETE 4+V
1 series · 5 of 5 positions shown · non-contrast
Comparison: None.

CLINICAL DATA: Status post fall.

EXAM:
LUMBAR SPINE - COMPLETE 4+ VIEW

[Series 1: dg lumbar spine complete 4 +v · 0.14mm/px · 5 of 5 slices shown]
[im 1/5]
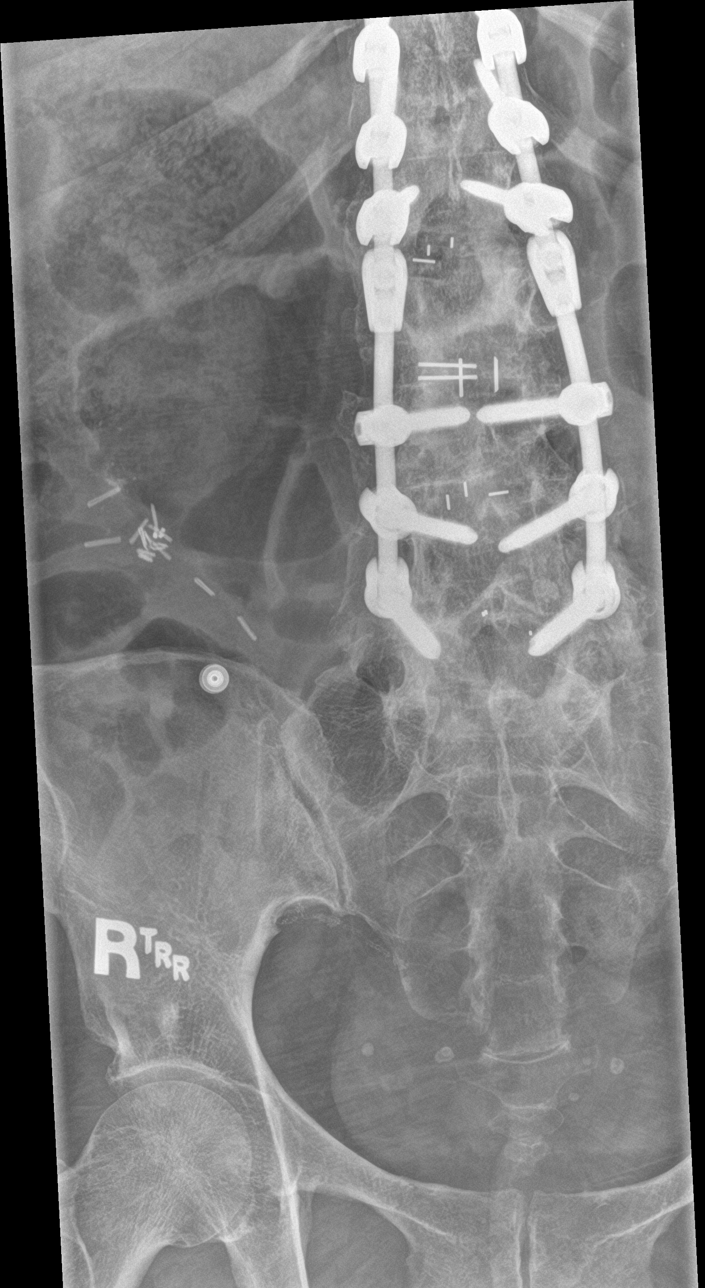
[im 2/5]
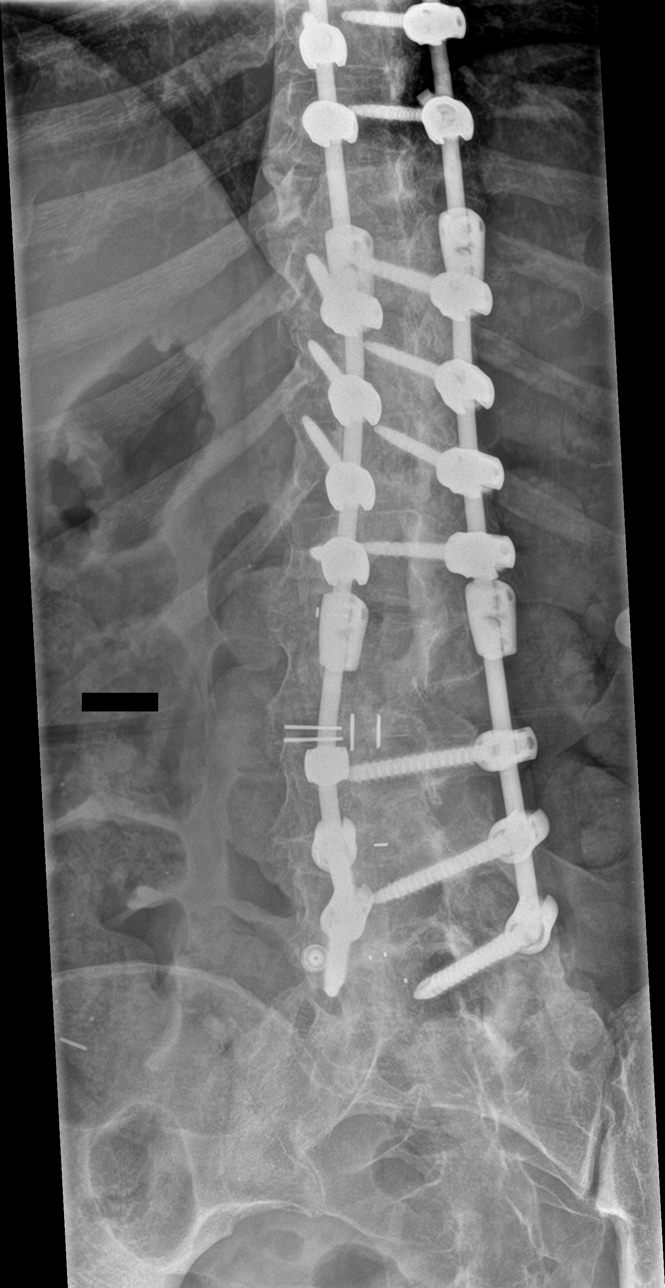
[im 3/5]
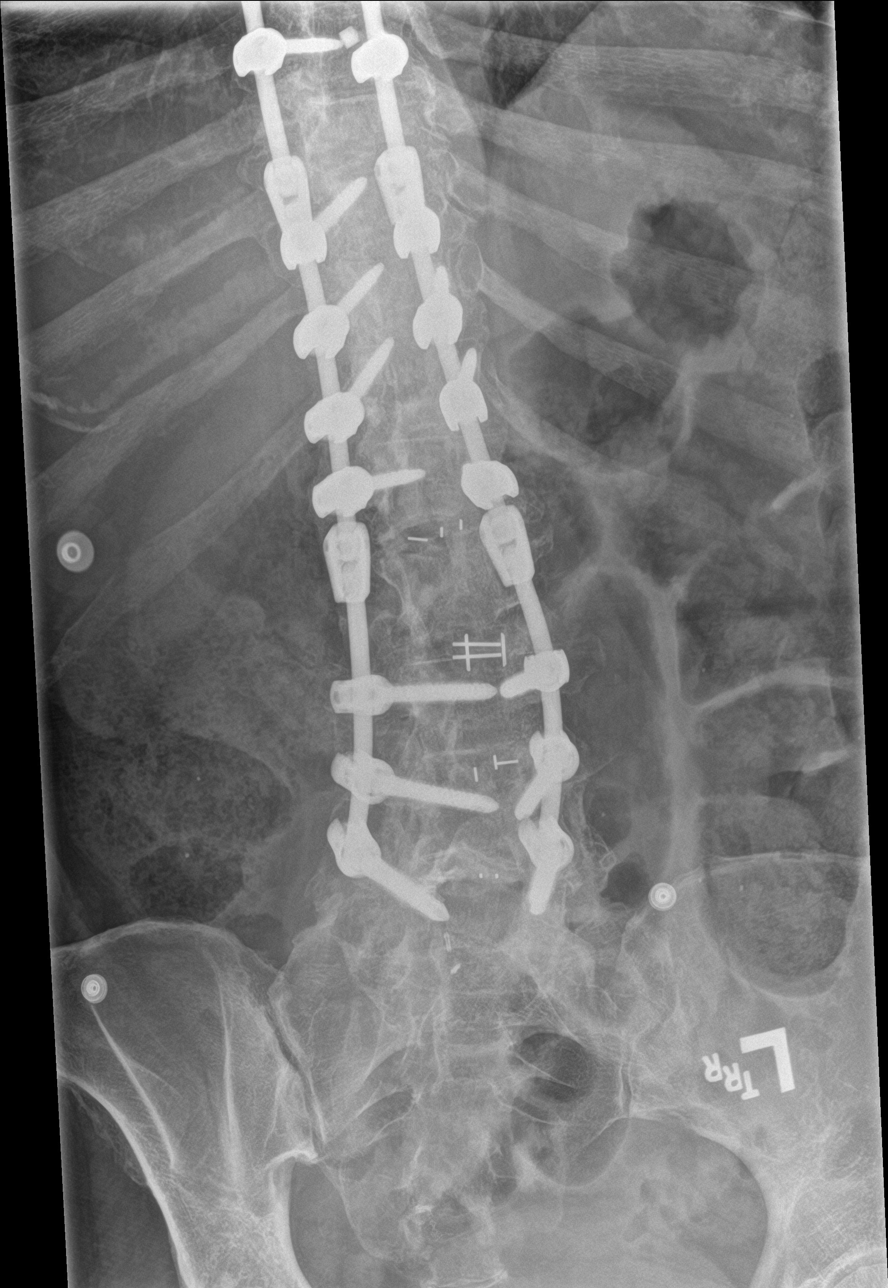
[im 4/5]
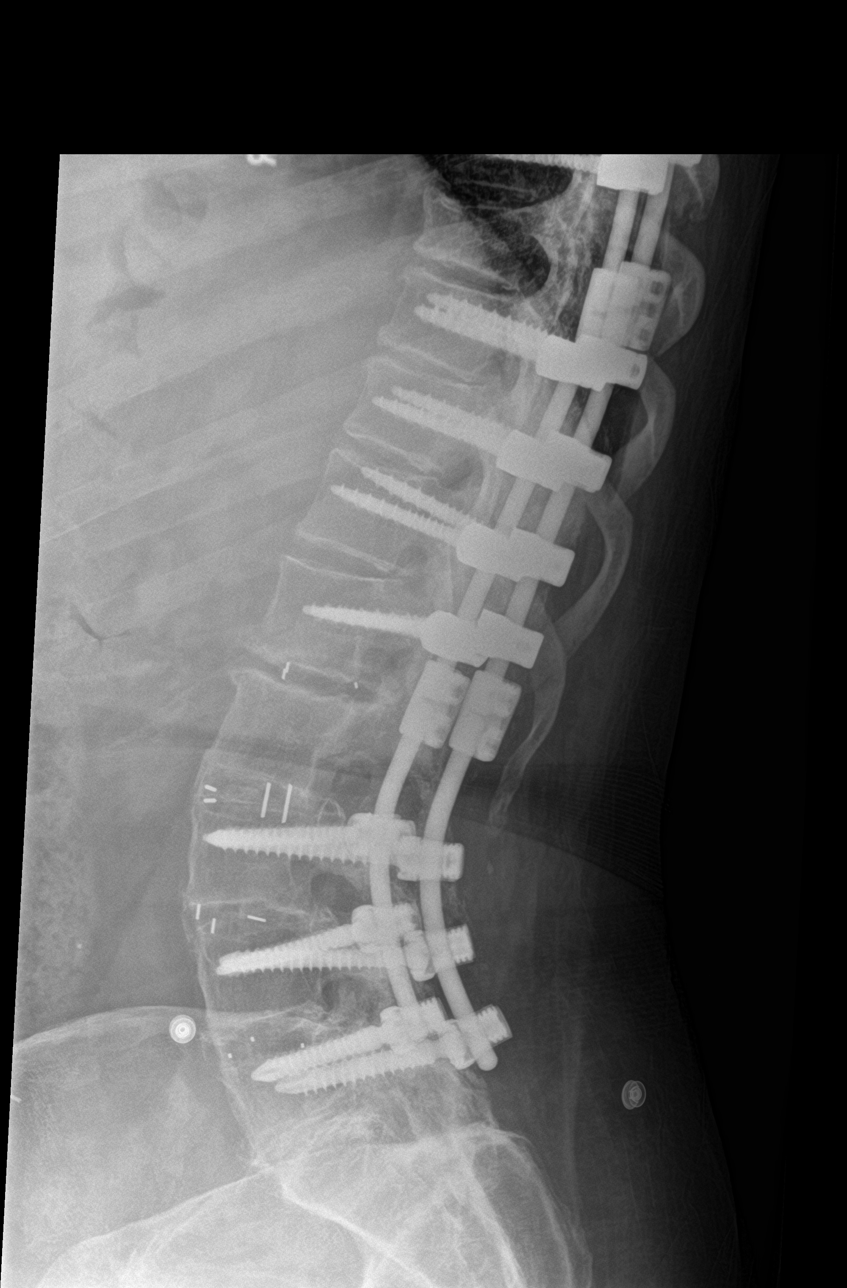
[im 5/5]
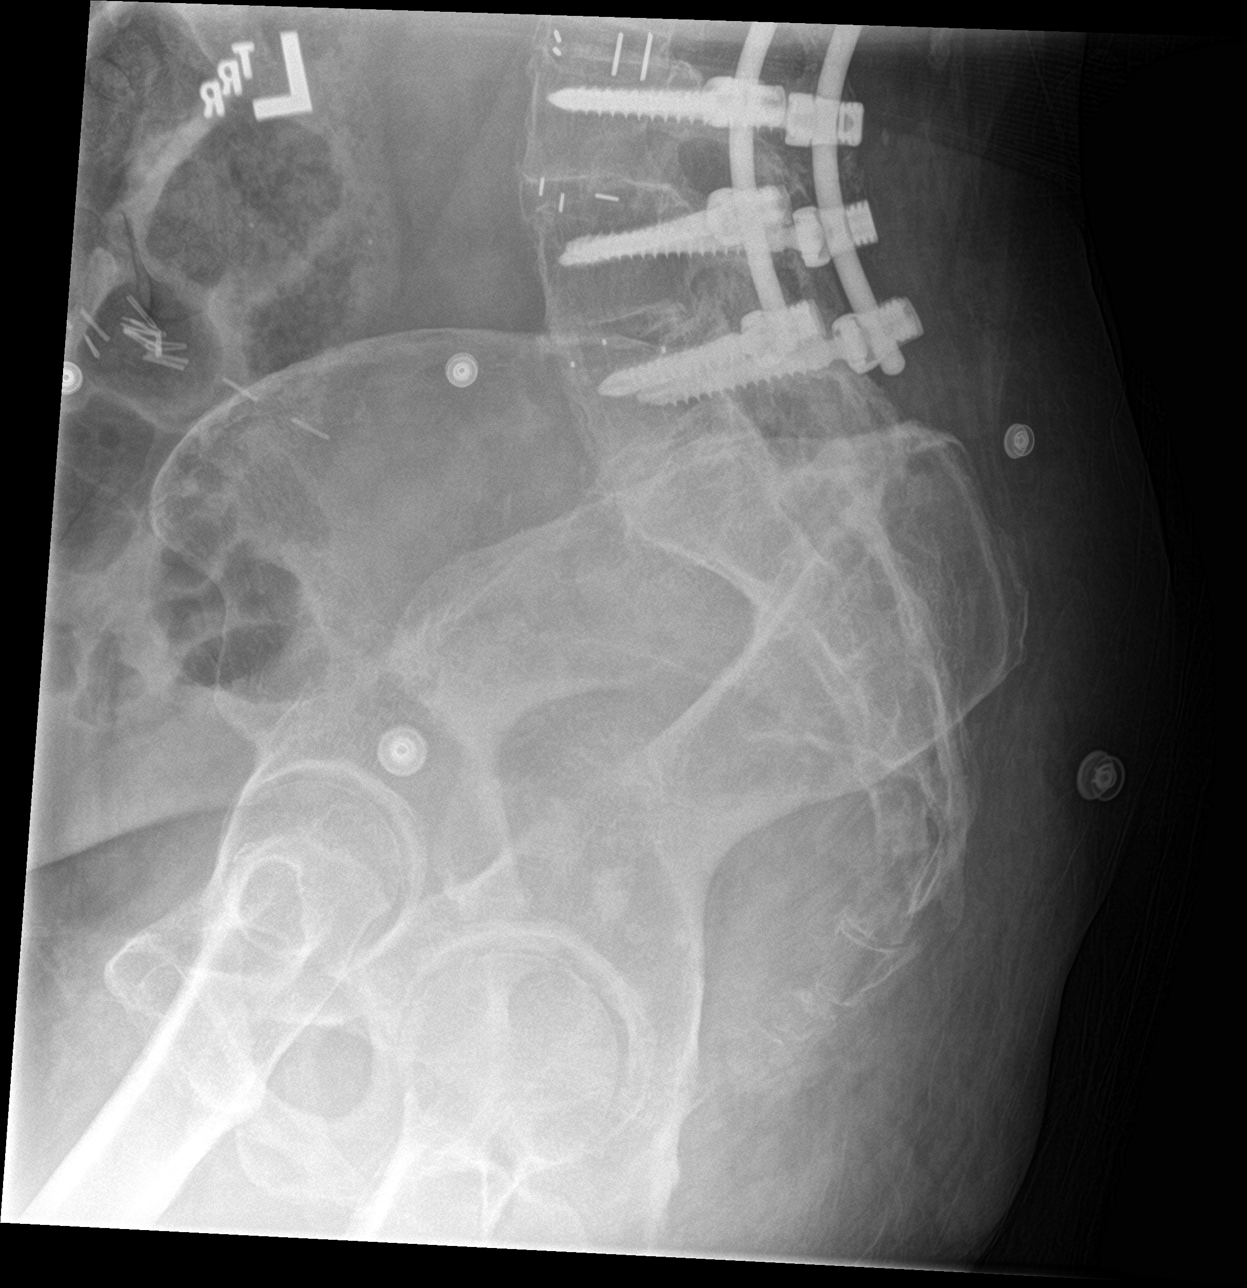

[5 of 5 positions shown; findings below may reference images not displayed]

FINDINGS: There is no evidence of acute lumbar spine fracture. Alignment is
normal. Bilateral radiopaque pedicle screws are seen within the
lumbar spine and visualized portion of the lower thoracic spine.
Radiopaque operative material is seen within the intervertebral disc
spaces at the levels of L1-L2, L2-L3, L3-L4 and L4-L5. Marked
severity intervertebral disc space narrowing is seen at the level of
L5-S1. Radiopaque surgical clips are seen overlying the right lower
quadrant.
IMPRESSION: Extensive postoperative changes within the lumbar spine and
visualized portion of the lower thoracic spine.

## 2020-12-02 IMAGING — CR DG HIP (WITH OR WITHOUT PELVIS) 2-3V*R*
1 series · 2 of 2 positions shown · non-contrast
Comparison: None.

CLINICAL DATA: Status post fall.

EXAM:
DG HIP (WITH OR WITHOUT PELVIS) 2-3V RIGHT

[Series 1: dg hip unilat w or w/o pelvis 2-3 views  · non-contrast · 0.14mm/px · 2 of 2 slices shown]
[im 1/2]
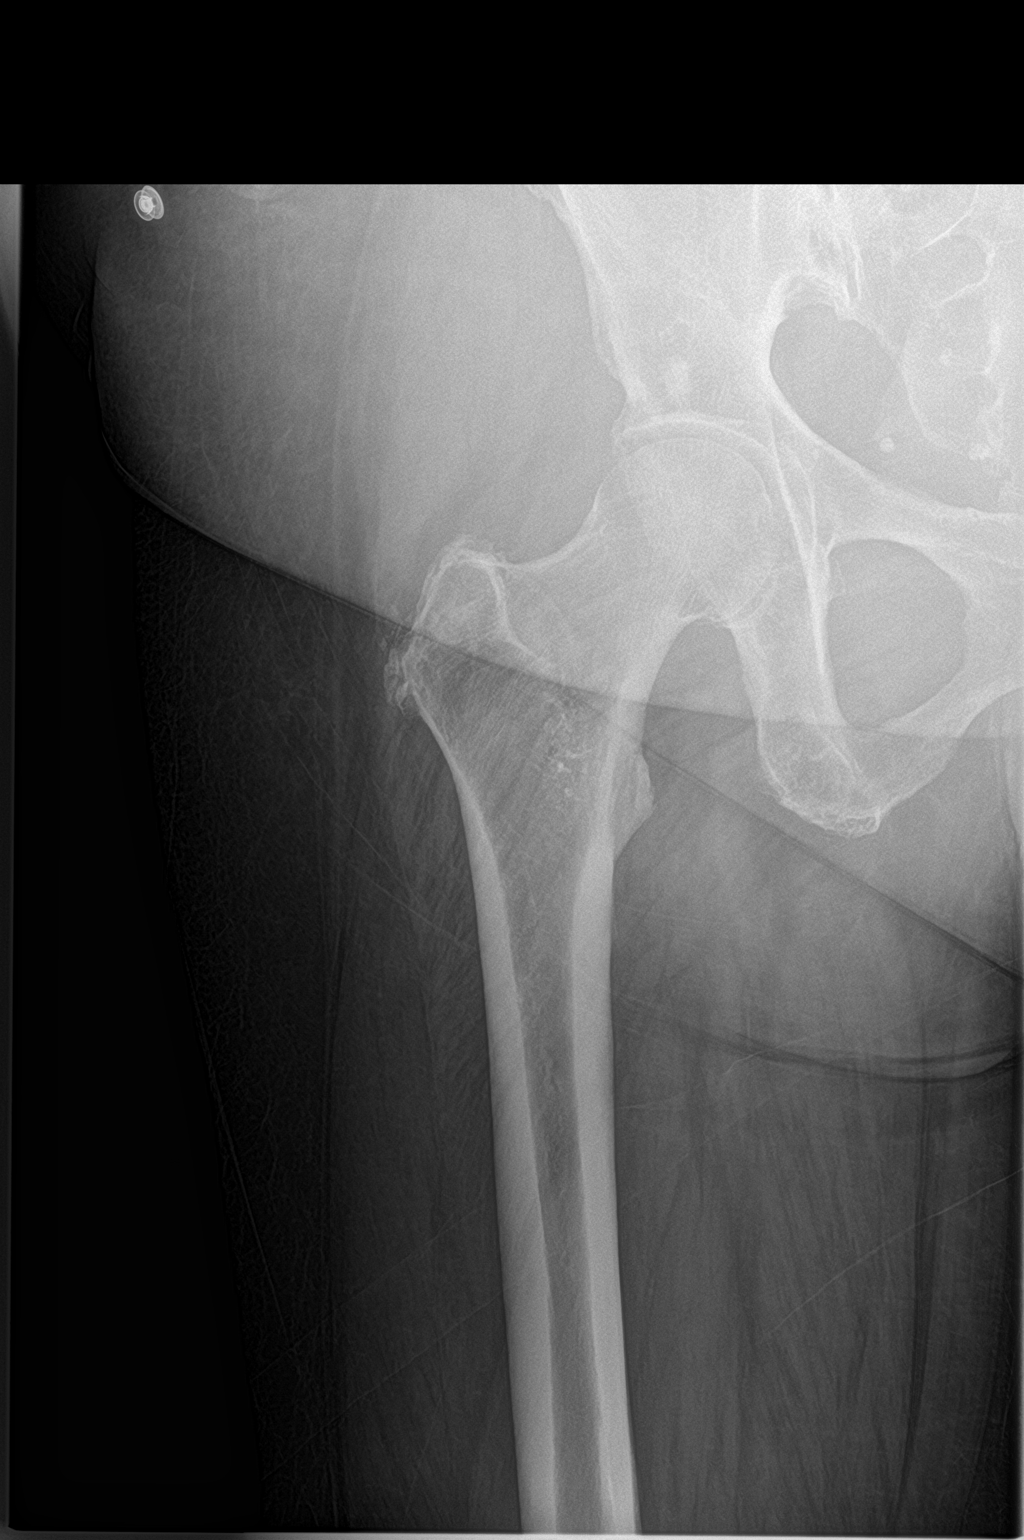
[im 2/2]
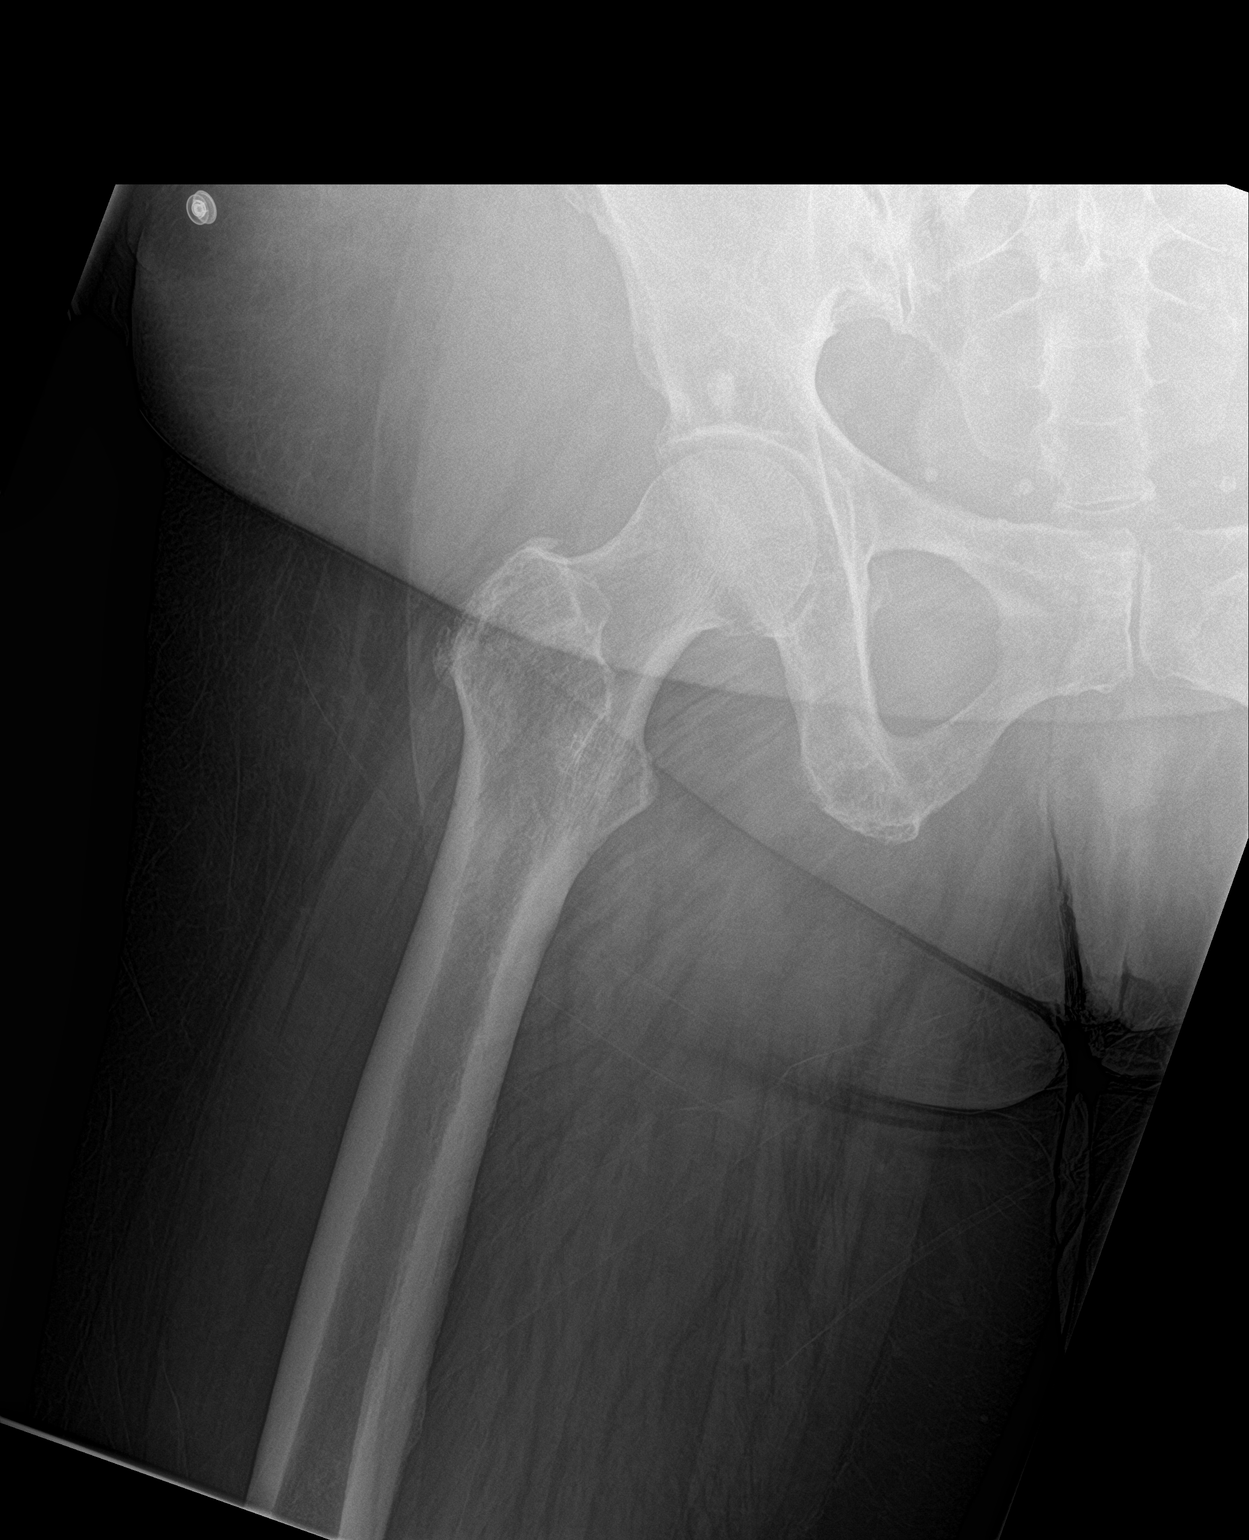

[2 of 2 positions shown; findings below may reference images not displayed]

FINDINGS: There is no evidence of hip fracture or dislocation. There is no
evidence of arthropathy. A 1.3 cm bone island is seen just above the
right acetabulum.
IMPRESSION: No acute osseous abnormality.

## 2020-12-03 ENCOUNTER — Ambulatory Visit
Admission: RE | Admit: 2020-12-03 | Discharge: 2020-12-03 | Disposition: A | Discharge status: Home / Self Care | Payer: Medicare Other | Source: Ambulatory Visit | Attending: Family Medicine | Admitting: Family Medicine

## 2020-12-03 DIAGNOSIS — Z1231 Encounter for screening mammogram for malignant neoplasm of breast: Secondary | ICD-10-CM | POA: Diagnosis not present

## 2020-12-03 NOTE — Progress Notes (Signed)
Subjective: SHEMEEKA GRANLUND is a 55 y.o. is seen today in office s/p right foot ulcer excision, graft application preformed on October 25, 2020.  She presents today for total contact cast change.  She been doing well in the cast has not been rubbing or causing any irritation that she reports.  No fevers or chills.  She has no other concerns.    Objective: General: No acute distress, AAOx3  DP/PT pulses palpable 2/4, CRT < 3 sec to all digits.  Right foot: Dressing clean, dry, intact.  Left inner dressing intact but did look underneath it and the wound appears to be filling it is more superficial and granular.  There is no surrounding erythema, ascending cellulitis.  No fluctuance or crepitation.  There is no malodor. No pain with calf compression, swelling, warmth, erythema.   Assessment and Plan:  Status post right foot ulcer excision, graft application, doing well with no complications   I left the inner dressing intact as the graft is still incorporating.  No obvious signs of infection.  ABD pad was applied and a total contact cast was applied making sure to pad all bony prominences.  We discussed precautions and if there is any irritation or rubbing to let me know immediately.  [name] DPM

## 2020-12-05 ENCOUNTER — Other Ambulatory Visit: Payer: Self-pay | Admitting: Family Medicine

## 2020-12-05 DIAGNOSIS — R928 Other abnormal and inconclusive findings on diagnostic imaging of breast: Secondary | ICD-10-CM

## 2020-12-08 ENCOUNTER — Other Ambulatory Visit: Payer: Self-pay | Admitting: Family Medicine

## 2020-12-11 ENCOUNTER — Other Ambulatory Visit: Payer: Self-pay

## 2020-12-11 ENCOUNTER — Ambulatory Visit (INDEPENDENT_AMBULATORY_CARE_PROVIDER_SITE_OTHER): Payer: Medicare Other | Admitting: Podiatry

## 2020-12-11 DIAGNOSIS — L97512 Non-pressure chronic ulcer of other part of right foot with fat layer exposed: Secondary | ICD-10-CM | POA: Diagnosis not present

## 2020-12-11 NOTE — Progress Notes (Signed)
Subjective: [name] [name] is a 55 y.o. is seen today in office s/p right foot ulcer excision, graft application preformed on October 25, 2020.  She presents today for changing of the total contact cast as well as for repeat graft application.  She states that the cast has been fitting well and not causing any irritation.  No fevers or chills that she reports.  She has no other concerns.    Objective: General: No acute distress, AAOx3  DP/PT pulses palpable 2/4, CRT < 3 sec to all digits.  Right foot: Cast intact upon removal wound is present still.  Prior to debridement the wound is hypergranular.  After debridement of the wound measures 1 x 0.9 x 0.1 cm.  No probing, undermining or tunneling.  There is no surrounding erythema, ascending cellulitis.  There is no fluctuation crepitation.  There is no malodor.  No obvious signs of infection present.  Clinically the wound appears to be doing better and is more superficial. No pain with calf compression, swelling, warmth, erythema.   Assessment and Plan:  Status post right foot ulcer excision, graft application, doing well with no complications   Today we discussed in depth the findings. All questions were answered. Excisional debridement performed through subcutaneous tissue with a curette to remove all fibrotic tissue and biofilm to help promote wound healing.  Fatty layer was exposed. Bleeding was controlled with direct pressure. Tolerated the procedure well.   Infinity graft (Ex[ 12/03/2020 Lot [id number]) is used today to promote and support wound healing.  I would like the entire 2.5 x 2.5 cm today.  I packed it in the wound.  It is then affixed and dressing applied.  Total contact cast was then applied making sure to pad all bony prominences.  Continue offloading, elevation and limit weightbearing.  I will see her back in about 1 week for repeat cast application.  [name] [name] DPM

## 2020-12-11 NOTE — Progress Notes (Signed)
Subjective: KHAMANI GOLUBSKI is a 55 y.o. is seen today in office s/p right foot ulcer excision, graft application preformed on October 25, 2020.  Since that she is doing well.  She presents today for cast change.  She states has been feeling well without any rubbing or irritation.  No fevers or chills.  She has no other concerns.   Objective: General: No acute distress, AAOx3  DP/PT pulses palpable 2/4, CRT < 3 sec to all digits.  Right foot: Cast intact which is removed.  After cleaning the wound today the wound is healing and measures 0.8 x 0.8 x 0.1 cm and is superficial.  There is no surrounding erythema, ascending cellulitis there is no fluctuance or crepitation.  There is no malodor. No pain with calf compression, swelling, warmth, erythema.   Assessment and Plan:  Status post right foot ulcer excision, graft application, doing well with no complications   Cast was removed today change.  I cleaned the wound and sharply debrided without any complications.  No signs of infection.  New total contact cast was applied making sure to pad all bony prominences.  Precautions were advised on if there is any irritation or rubbing to let me know immediately.  Limit weightbearing.  Elevation.  [name] DPM

## 2020-12-13 ENCOUNTER — Other Ambulatory Visit: Payer: Self-pay | Admitting: Family Medicine

## 2020-12-13 DIAGNOSIS — J328 Other chronic sinusitis: Secondary | ICD-10-CM

## 2020-12-17 ENCOUNTER — Ambulatory Visit
Admission: RE | Admit: 2020-12-17 | Discharge: 2020-12-17 | Disposition: A | Discharge status: Home / Self Care | Payer: Medicare Other | Source: Ambulatory Visit | Attending: Family Medicine | Admitting: Family Medicine

## 2020-12-17 ENCOUNTER — Ambulatory Visit: Payer: Medicare Other

## 2020-12-17 ENCOUNTER — Other Ambulatory Visit: Payer: Self-pay

## 2020-12-17 DIAGNOSIS — R922 Inconclusive mammogram: Secondary | ICD-10-CM | POA: Diagnosis not present

## 2020-12-17 DIAGNOSIS — R928 Other abnormal and inconclusive findings on diagnostic imaging of breast: Secondary | ICD-10-CM

## 2020-12-18 ENCOUNTER — Ambulatory Visit (INDEPENDENT_AMBULATORY_CARE_PROVIDER_SITE_OTHER): Payer: Medicare Other | Admitting: Podiatry

## 2020-12-18 ENCOUNTER — Other Ambulatory Visit: Payer: Self-pay

## 2020-12-18 DIAGNOSIS — G4733 Obstructive sleep apnea (adult) (pediatric): Secondary | ICD-10-CM | POA: Diagnosis not present

## 2020-12-18 DIAGNOSIS — M869 Osteomyelitis, unspecified: Secondary | ICD-10-CM | POA: Diagnosis not present

## 2020-12-18 DIAGNOSIS — R531 Weakness: Secondary | ICD-10-CM | POA: Diagnosis not present

## 2020-12-18 DIAGNOSIS — L97512 Non-pressure chronic ulcer of other part of right foot with fat layer exposed: Secondary | ICD-10-CM

## 2020-12-19 ENCOUNTER — Other Ambulatory Visit: Payer: Self-pay | Admitting: Family Medicine

## 2020-12-20 DIAGNOSIS — Z20822 Contact with and (suspected) exposure to covid-19: Secondary | ICD-10-CM | POA: Diagnosis not present

## 2020-12-23 ENCOUNTER — Other Ambulatory Visit: Payer: Self-pay

## 2020-12-23 ENCOUNTER — Ambulatory Visit (INDEPENDENT_AMBULATORY_CARE_PROVIDER_SITE_OTHER): Payer: Medicare Other | Admitting: Podiatry

## 2020-12-23 DIAGNOSIS — Z9889 Other specified postprocedural states: Secondary | ICD-10-CM

## 2020-12-23 DIAGNOSIS — L97512 Non-pressure chronic ulcer of other part of right foot with fat layer exposed: Secondary | ICD-10-CM

## 2020-12-24 NOTE — Progress Notes (Signed)
Subjective: [name] [name] is a 55 y.o. is seen today in office s/p right foot ulcer excision, graft application preformed on October 25, 2020.  She presents today for removal to total contact cast.  She states that this is been doing well and not causing any irritation.  No recent injury or changes.  No fevers or chills.  Objective: General: No acute distress, AAOx3  DP/PT pulses palpable 2/4, CRT < 3 sec to all digits.  Right foot: Cast intact which is removed.  After cleaning the wound today the wound is healing and measures 0.6 x 0.6 x 0.1 cm and is superficial and has herniated granulation tissue present of the wound.  There is no surrounding erythema, ascending cellulitis there is no fluctuance or crepitation.  There is no malodor. No pain with calf compression, swelling, warmth, erythema.   Assessment and Plan:  Status post right foot ulcer excision, graft application, doing well with no complications   Cast was removed today change.  I cleaned the wound and sharply debrided without any complications.  Small silver nitrate was applied.  The wound was cleansed.  Dressing applied.  New total contact cast was applied make sure to pad all bony prominences.  Precautions were advised to return immediately or go to the emergency room should there be any irritation.  We will see her back on Tuesday treatment the total contact cast will likely give her a break as the skin is somewhat dry I do not want there to be any other ulcers or breakdown.  [name] DPM

## 2020-12-25 ENCOUNTER — Other Ambulatory Visit: Payer: Self-pay | Admitting: Cardiovascular Disease

## 2020-12-25 NOTE — Telephone Encounter (Signed)
REQUEST ALREADY RESPONDED TO

## 2020-12-27 NOTE — Progress Notes (Signed)
Subjective: [name] [name] is a 55 y.o. is seen today in office s/p right foot ulcer excision, graft application preformed on October 25, 2020.  She presents today for total contact cast removed.  She states that it is not causing any irritation or rubbing that she reports.  She has no new concerns today.  No fevers or chills.   Objective: General: No acute distress, AAOx3  DP/PT pulses palpable 2/4, CRT < 3 sec to all digits.  Right foot: Cast intact which is removed.  Clean the wound today.  There is still small amount of herniated tissue through the wound with smaller today 0.5 x 0.5 x 0.1 cm.  There is some callus formation around the wound today which I debrided and some dry skin.  There is no fluctuance or crepitation.  No malodor.  No cellulitis.  No warmth of the foot. No pain with calf compression, swelling, warmth, erythema.   Assessment and Plan:  Status post right foot ulcer excision, graft application, doing well with no complications   Cast was removed today.  Patient recent dry skin so therefore I wanted to hold off on cast for now so hopefully this will help with any further skin breakdown.  I cleaned the wound today I debrided some of the hyperkeratotic tissue on the periphery of the wound.  Small amount of silver nitrate was applied and the wound was cleaned and a small amount of Silvadene was applied followed by dressing.  Continue with daily dressing changes for now which we discussed.  Continue nonweightbearing in the cam boot.  I will see her back in about a week at that point if needed we will potentially reapply total contact cast.  [name] DPM

## 2020-12-29 ENCOUNTER — Other Ambulatory Visit: Payer: Self-pay | Admitting: Family Medicine

## 2020-12-30 ENCOUNTER — Encounter: Payer: Self-pay | Admitting: Podiatry

## 2020-12-30 ENCOUNTER — Ambulatory Visit (INDEPENDENT_AMBULATORY_CARE_PROVIDER_SITE_OTHER): Payer: Medicare Other | Admitting: Podiatry

## 2020-12-30 ENCOUNTER — Other Ambulatory Visit: Payer: Self-pay

## 2020-12-30 DIAGNOSIS — L97511 Non-pressure chronic ulcer of other part of right foot limited to breakdown of skin: Secondary | ICD-10-CM | POA: Diagnosis not present

## 2020-12-30 DIAGNOSIS — G629 Polyneuropathy, unspecified: Secondary | ICD-10-CM | POA: Diagnosis not present

## 2021-01-01 DIAGNOSIS — M7060 Trochanteric bursitis, unspecified hip: Secondary | ICD-10-CM | POA: Diagnosis not present

## 2021-01-01 DIAGNOSIS — M47816 Spondylosis without myelopathy or radiculopathy, lumbar region: Secondary | ICD-10-CM | POA: Diagnosis not present

## 2021-01-01 DIAGNOSIS — G894 Chronic pain syndrome: Secondary | ICD-10-CM | POA: Diagnosis not present

## 2021-01-01 DIAGNOSIS — M533 Sacrococcygeal disorders, not elsewhere classified: Secondary | ICD-10-CM | POA: Diagnosis not present

## 2021-01-01 DIAGNOSIS — M961 Postlaminectomy syndrome, not elsewhere classified: Secondary | ICD-10-CM | POA: Diagnosis not present

## 2021-01-04 NOTE — Progress Notes (Signed)
Subjective: [name] [name] is a 55 y.o. is seen today in office s/p right foot ulcer excision, graft application preformed on October 25, 2020.  She presents today for follow-up evaluation possible total contact cast application.  She denies any fevers or chills.  She does not see any increasing swelling or redness or any drainage.  She has no other concerns today.   Objective: General: No acute distress, AAOx3  DP/PT pulses palpable 2/4, CRT < 3 sec to all digits.  Right foot: Dressing is removed today.  Today the wound is smaller measuring 0.4 x 0.4 cm and is starting to scab over.  There is no surrounding erythema, ascending cellulitis but there is no fluctuance crepitation but there is no malodor.  The area of callus formation adjacent to the wound has resolved. No pain with calf compression, swelling, warmth, erythema.   Assessment and Plan:  Status post right foot ulcer excision, graft application, doing well with no complications   Sharply debrided the wound today utilizing 312 with scalpel.  Started to scab over and was healed.  However it still is open somewhat.  Today a new total contact cast was applied making sure to pad all bony prominences.  Precautions were advised to call the office immediately or go to the emergency department should there be any irritation. Monitor for any clinical signs or symptoms of infection and directed to call the office immediately should any occur or go to the ER.  [name] DPM

## 2021-01-06 ENCOUNTER — Emergency Department (HOSPITAL_COMMUNITY): Payer: Medicare Other

## 2021-01-06 ENCOUNTER — Encounter (HOSPITAL_COMMUNITY): Payer: Self-pay

## 2021-01-06 ENCOUNTER — Emergency Department (HOSPITAL_COMMUNITY)
Admission: EM | Admit: 2021-01-06 | Discharge: 2021-01-06 | Disposition: A | Discharge status: Home / Self Care | Payer: Medicare Other | Attending: Emergency Medicine | Admitting: Emergency Medicine

## 2021-01-06 ENCOUNTER — Other Ambulatory Visit: Payer: Self-pay

## 2021-01-06 DIAGNOSIS — S22030A Wedge compression fracture of third thoracic vertebra, initial encounter for closed fracture: Secondary | ICD-10-CM | POA: Diagnosis not present

## 2021-01-06 DIAGNOSIS — Z79899 Other long term (current) drug therapy: Secondary | ICD-10-CM | POA: Insufficient documentation

## 2021-01-06 DIAGNOSIS — I10 Essential (primary) hypertension: Secondary | ICD-10-CM | POA: Diagnosis not present

## 2021-01-06 DIAGNOSIS — W19XXXA Unspecified fall, initial encounter: Secondary | ICD-10-CM

## 2021-01-06 DIAGNOSIS — M545 Low back pain, unspecified: Secondary | ICD-10-CM | POA: Insufficient documentation

## 2021-01-06 DIAGNOSIS — Z981 Arthrodesis status: Secondary | ICD-10-CM | POA: Diagnosis not present

## 2021-01-06 DIAGNOSIS — S32028A Other fracture of second lumbar vertebra, initial encounter for closed fracture: Secondary | ICD-10-CM | POA: Diagnosis not present

## 2021-01-06 DIAGNOSIS — J4520 Mild intermittent asthma, uncomplicated: Secondary | ICD-10-CM | POA: Insufficient documentation

## 2021-01-06 DIAGNOSIS — M25511 Pain in right shoulder: Secondary | ICD-10-CM | POA: Diagnosis not present

## 2021-01-06 DIAGNOSIS — R0902 Hypoxemia: Secondary | ICD-10-CM | POA: Diagnosis not present

## 2021-01-06 DIAGNOSIS — Z743 Need for continuous supervision: Secondary | ICD-10-CM | POA: Diagnosis not present

## 2021-01-06 DIAGNOSIS — M549 Dorsalgia, unspecified: Secondary | ICD-10-CM | POA: Diagnosis not present

## 2021-01-06 DIAGNOSIS — W108XXA Fall (on) (from) other stairs and steps, initial encounter: Secondary | ICD-10-CM | POA: Diagnosis not present

## 2021-01-06 DIAGNOSIS — M5441 Lumbago with sciatica, right side: Secondary | ICD-10-CM | POA: Diagnosis not present

## 2021-01-06 DIAGNOSIS — R52 Pain, unspecified: Secondary | ICD-10-CM | POA: Diagnosis not present

## 2021-01-06 MED ORDER — HYDROMORPHONE HCL 1 MG/ML IJ SOLN
1.0000 mg | Freq: Once | INTRAMUSCULAR | Status: AC
  Administered 2021-01-06: 1 mg via INTRAMUSCULAR
  Filled 2021-01-06: qty 1

## 2021-01-06 MED ORDER — OXYCODONE-ACETAMINOPHEN 5-325 MG PO TABS
2.0000 | ORAL_TABLET | Freq: Once | ORAL | Status: AC
  Administered 2021-01-06: 2 via ORAL
  Filled 2021-01-06: qty 2

## 2021-01-06 NOTE — ED Notes (Signed)
Pt assisted to side of bed. Pt stated she was unable to ambulate due to pain.

## 2021-01-06 NOTE — ED Notes (Signed)
Pt ambulated out of bed to chair without difficulties. Pt complained of shoulder pain while ambulating.

## 2021-01-06 NOTE — ED Notes (Signed)
Patient transported to X-ray 

## 2021-01-06 NOTE — ED Provider Notes (Addendum)
[facility] EMERGENCY DEPARTMENT Provider Note   CSN: [id number] Arrival date & time: 01/06/21  1226     History Chief Complaint  Patient presents with   [name] [name]    [name] [name] is a 55 y.o. female.  The history is provided by the patient. No language interpreter was used.  Fall This is a new problem. The problem occurs constantly. The problem has been gradually worsening. Nothing aggravates the symptoms. Nothing relieves the symptoms. She has tried nothing for the symptoms. The treatment provided no relief. Pt reports she slipped and fell on the steps,  Pt reports she landed on her bottom.  Pt reports she has chronic back problems and is scheduled to have surgery to have rods removed because she has one that is broken      Past Medical History:  Diagnosis Date   Anxiety    Arthritis    Phreesia 02/08/2020   Asthma    mild intermittent   Asthma    Phreesia 02/08/2020   Bipolar 1 disorder ([place])    ect treatments last treatment Sep 02 1011   Depression    Depression    Phreesia 02/08/2020   Depression    Phreesia 07/10/2020   GERD (gastroesophageal reflux disease)    Hypertension    Pre-diabetes    Seizures ([place])    last seizure was so long ago she can't remember.    Sleep apnea    wears CPAP, uncertain of setting   Substance abuse ([place])    Phreesia 02/08/2020    Patient Active Problem List   Diagnosis Date Noted   Open wound of right foot, initial encounter 10/21/2020   Finger ulcer ([place]) 10/21/2020   Syncope 08/26/2019   Non-healing open wound of toe 05/15/2019   Osteomyelitis ([place]) 05/15/2019   Palpitations 10/26/2018   Foot ulcer ([place]) 01/31/2018   Hyperglycemia 01/31/2018   Left leg cellulitis 09/24/2017   Syncope and collapse    Near syncope 11/23/2016   Abdominal pain 09/23/2016   Anxiety 09/23/2016   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[place]) 09/23/2016   Constipation 09/23/2016   DDD (degenerative disc disease), lumbosacral 09/23/2016    Migraines 09/23/2016   Myelopathy ([place]) 09/23/2016   Nausea 09/23/2016   Post laminectomy syndrome 09/23/2016   Pseudoarthrosis of lumbar spine 09/23/2016   Seizures ([place]) 09/23/2016   Shortness of breath 09/23/2016   Sleep apnea 09/23/2016   Spinal stenosis of lumbar region 09/23/2016   Vertigo 09/23/2016   Hallux malleus 11/15/2015   Chronic pain syndrome    GERD (gastroesophageal reflux disease)    Depression    Rash 11/14/2015   Toe ulcer, right ([place]) 04/11/2015   Cellulitis of toe of right foot 04/11/2015   Nonspecific chest pain    Chest pain 10/26/2014   Obesity (BMI 30-39.9) 10/26/2014   Hypokalemia 10/26/2014   Manic bipolar I disorder ([place]) 10/26/2014   Atypical angina ([place]) 10/26/2014   Tardive dyskinesia 08/26/2011   Manic bipolar I disorder with rapid cycling ([place]) 05/18/2011    Class: Acute   Asthma 02/24/2011   Essential hypertension 02/24/2011   History of migraine headaches 02/24/2011    Past Surgical History:  Procedure Laterality Date   ABDOMINAL HYSTERECTOMY     AMPUTATION TOE Left 02/02/2018   Procedure: AMPUTATION TOE Left 4th toe;  Surgeon: [name], DPM;  Location: [place];  Service: Podiatry;  Laterality: Left;   APPENDECTOMY N/A    Phreesia 02/08/2020   BACK SURGERY  CARPAL TUNNEL RELEASE     x2   IRRIGATION AND DEBRIDEMENT ABSCESS Right 10/25/2020   Procedure: IRRIGATION AND DEBRIDEMENT ABSCESS OF FOOT AND APPLICATION OF GRAFT;  Surgeon: [name], DPM;  Location: [place];  Service: Podiatry;  Laterality: Right;   Laproscopic knee surgery     NECK SURGERY  03/15/2017   PLANTAR FASCIA RELEASE     x2   SPINE SURGERY N/A    Phreesia 02/08/2020   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   Procedure: TRANSMETATARSAL AMPUTATION;  Surgeon: [name], DPM;  Location: [facility];  Service: Podiatry;  Laterality: Right;     OB History     Gravida  3   Para  3   Term  3   Preterm      AB      Living  3       SAB      IAB      Ectopic      Multiple      Live Births  3           Family History  Problem Relation Age of Onset   Diabetes Mother    COPD Mother    Hypertension Mother    Hyperlipidemia Mother    Heart disease Father    Hyperlipidemia Father    Hypertension Father    Cancer Father    Heart disease Brother    Heart disease Daughter    Heart disease Maternal Grandmother    Hypertension Maternal Grandmother    Heart disease Maternal Grandfather    Kidney cancer Paternal Grandmother    Heart disease Paternal Grandfather     Social History   Tobacco Use   Smoking status: Never   Smokeless tobacco: Never  Vaping Use   Vaping Use: Never used  Substance Use Topics   Alcohol use: No   Drug use: No    Home Medications Prior to Admission medications   Medication Sig Start Date End Date Taking? Authorizing Provider  AIMOVIG 70 MG/ML SOAJ ADMINISTER 1 ML UNDER THE SKIN EVERY 30 DAYS Patient taking differently: Inject 1 mL into the skin every 30 (thirty) days. 11/10/20  Yes [name], MD  albuterol (PROAIR HFA) 108 [phone] Base) MCG/ACT inhaler Inhale 2 puffs = 112mcg into the lungs every 6 (six) hours as needed for wheezing or shortness of breath. 07/11/20  Yes Noemi Chapel A, NP  busPIRone (BUSPAR) 15 MG tablet Take 15 mg by mouth 3 (three) times daily.  04/26/17  Yes [provider]  Calcium Carbonate-Vit D-Min (CALCIUM 1200 PO) Take 1,200 mg by mouth daily.    Yes [provider]  cyclobenzaprine (FLEXERIL) 10 MG tablet Take 10 mg by mouth 2 (two) times daily as needed for muscle spasms.    Yes [provider]  DULoxetine (CYMBALTA) 60 MG capsule TAKE 1 CAPSULE BY MOUTH EVERY DAY 11/07/20  Yes [name], MD  escitalopram (LEXAPRO) 10 MG tablet Take 10 mg by mouth daily. 08/23/19  Yes [provider]  estradiol (ESTRACE) 1 MG tablet TAKE 1 TABLET AT BEDTIME 08/22/20  Yes Pickard, Cammie Mcgee, MD  fluticasone (FLONASE) 50  MCG/ACT nasal spray SPRAY 2 SPRAYS INTRO EACH NOSTRIL EVERY DAY Patient taking differently: Place 2 sprays into both nostrils daily. 12/18/20  Yes [name], MD  haloperidol (HALDOL) 5 MG tablet Take 1 tablet (5 mg total) by mouth at bedtime. For psychosis 11/15/15  Yes Barton Dubois, MD  levocetirizine (  XYZAL) 5 MG tablet TAKE 1 TABLET BY MOUTH EVERY DAY IN THE EVENING 11/17/20  Yes [name], MD  LINZESS 290 MCG CAPS capsule TAKE 1 CAPSULE BY MOUTH EVERY MORNING 11/20/20  Yes [name], MD  meloxicam (MOBIC) 15 MG tablet Take 15 mg by mouth at bedtime.  09/15/17  Yes [provider]  metoprolol succinate (TOPROL-XL) 25 MG 24 hr tablet TAKE 1 TABLET(25 MG) BY MOUTH DAILY. Keep upcoming appointment 12/25/20  Yes [name], MD  Multiple Vitamins-Minerals (MULTIVITAMIN WITH MINERALS) tablet Take 1 tablet by mouth daily.   Yes [provider]  OVER THE COUNTER MEDICATION Fennel Seed   Yes [provider]  oxyCODONE-acetaminophen (PERCOCET) 7.5-325 MG tablet Take 1 tablet by mouth 5 (five) times daily as needed for severe pain.   Yes [provider]  pantoprazole (PROTONIX) 40 MG tablet TAKE 1 TABLET BY MOUTH TWICE DAILY 12/30/20  Yes [name], MD  potassium chloride SA (KLOR-CON) 20 MEQ tablet TAKE 2 TABLETS BY MOUTH EVERY DAY 12/08/20  Yes [name], MD  prazosin (MINIPRESS) 2 MG capsule Take 2 mg by mouth at bedtime.   Yes [provider]  pregabalin (LYRICA) 100 MG capsule Take 100 mg by mouth 2 (two) times daily. 06/26/20  Yes [provider]  solifenacin (VESICARE) 10 MG tablet Take 1 tablet (10 mg total) by mouth daily. 11/17/20  Yes [name], MD  SUMAtriptan (IMITREX) 50 MG tablet TAKE 1 TAB EVERY 2 HOURS AS NEEDED FOR MIGRAINE. MAY REPEAT IN 2 HOURS IF PERSISTS OR RECURS Patient taking differently: Take 50 mg by mouth every 2 (two) hours as needed for migraine. 08/30/19  Yes [name], MD   topiramate (TOPAMAX) 50 MG tablet TAKE 1 TABLET BY MOUTH TWICE DAILY 12/19/20  Yes [name], MD  traZODone (DESYREL) 100 MG tablet Take 100 mg by mouth at bedtime. 07/29/14  Yes [provider]  cephALEXin (KEFLEX) 500 MG capsule Take 1 capsule (500 mg total) by mouth 3 (three) times daily. Patient not taking: No sig reported 11/18/20   [name], DPM    Allergies    Other, Penicillins, Tetracyclines & related, Tramadol, Phenazopyridine, Tetracycline, Ciprofloxacin, Codeine, and Estradiol  Review of Systems   Review of Systems  All other systems reviewed and are negative.  Physical Exam Updated Vital Signs BP 128/85   Pulse 74   Temp 98.4 F (36.9 C) (Oral)   Resp 16   Ht 5\' 4"  (1.626 m)   Wt 106.1 kg   SpO2 99%   BMI 40.17 kg/m   Physical Exam Vitals reviewed.  Constitutional:      Appearance: Normal appearance.  HENT:     Head: Normocephalic.     Mouth/Throat:     Mouth: Mucous membranes are moist.  Cardiovascular:     Rate and Rhythm: Normal rate.  Pulmonary:     Effort: Pulmonary effort is normal.  Musculoskeletal:        General: Tenderness present.     Cervical back: Normal range of motion.  Skin:    General: Skin is warm.  Neurological:     General: No focal deficit present.     Mental Status: She is alert.  Psychiatric:        Mood and Affect: Mood normal.    ED Results / Procedures / Treatments   Labs (all labs ordered are listed, but only abnormal results are displayed) Labs Reviewed -  No data to display  EKG None  Radiology DG Thoracic Spine W/Swimmers  Result Date: 01/06/2021 CLINICAL DATA:  Trip and fall, pain EXAM: THORACIC SPINE - 3 VIEWS COMPARISON:  11/06/2019 FINDINGS: There is extensive posterior thoracolumbar rod fusion, bridging compression deformities of T3 and T8. Multilevel discectomy of the lumbar spine. There is a mildly displaced fracture of the right fusion rod at the level of L2-L3. No evidence of acute  fracture or perihardware fracture. The partially imaged chest and abdomen are unremarkable. IMPRESSION: 1. There is extensive posterior thoracolumbar rod fusion, bridging compression deformities of T3 and T8. Multilevel discectomy of the lumbar spine. No evidence of acute fracture or perihardware fracture. 2. There is a mildly displaced fracture of the right sided fusion rod at the level of T6-A2, of uncertain acuity. Electronically Signed   By: Eddie Candle M.D.   On: 01/06/2021 14:38   DG Lumbar Spine Complete  Result Date: 01/06/2021 CLINICAL DATA:  Back pain after fall. EXAM: LUMBAR SPINE - COMPLETE 4+ VIEW COMPARISON:  Sep 04, 2019. FINDINGS: Status post surgical posterior fusion extending from lower thoracic spine to L5. Bilateral pedicular screw placement is noted at all levels except L2. Interbody fusion is noted at all levels. No vertebral body fracture is noted. However, fracture is seen involving the right-sided fixation rod at the L2-3 level. IMPRESSION: Extensive postsurgical and degenerative changes as described above. No vertebral body fracture is noted. However, there is noted a fracture involving the right-sided fixation rod at the L2-3 level. Electronically Signed   By: Marijo Conception M.D.   On: 01/06/2021 14:37   DG Shoulder Right  Result Date: 01/06/2021 CLINICAL DATA:  Right shoulder pain after fall. EXAM: RIGHT SHOULDER - 2+ VIEW COMPARISON:  None. FINDINGS: There is no evidence of fracture or dislocation. There is no evidence of arthropathy or other focal bone abnormality. Soft tissues are unremarkable. IMPRESSION: Negative. Electronically Signed   By: Marijo Conception M.D.   On: 01/06/2021 14:38    Procedures Procedures   Medications Ordered in ED Medications  HYDROmorphone (DILAUDID) injection 1 mg (1 mg Intramuscular Given 01/06/21 1346)  oxyCODONE-acetaminophen (PERCOCET/ROXICET) 5-325 MG per tablet 2 tablet (2 tablets Oral Given 01/06/21 1604)    ED Course  I have  reviewed the triage vital signs and the nursing notes.  Pertinent labs & imaging results that were available during my care of the patient were reviewed by me and considered in my medical decision making (see chart for details).    MDM Rules/Calculators/A&P                           QJF:HLKT t spine and ls spine  no acute fracture,    Pt given dilaudid IM,  Pt reports minimal pain relief.  Pt given percocet 2 tablets,  Pt reports improved pain.  Pt able to stand and ambulate without difficulty  Final Clinical Impression(s) / ED Diagnoses Final diagnoses:  Fall, initial encounter  Acute low back pain without sciatica, unspecified back pain laterality    Rx / DC Orders ED Discharge Orders     None     An After Visit Summary was printed and given to the patient.    Fransico Meadow, PA-C 01/06/21 1833    Fransico Meadow, PA-C 01/06/21 Jodelle Red, MD 01/06/21 [phone]

## 2021-01-06 NOTE — ED Triage Notes (Signed)
Pt to er room number 12 via ems, pt states that she tripped on the second step and fell onto her back, states that she fell through the doorway.  Pt reports shoulder and back pain, states that she has a hx of rods in her back, states that they are broken and was awaiting surgery on the 17th

## 2021-01-08 ENCOUNTER — Ambulatory Visit (INDEPENDENT_AMBULATORY_CARE_PROVIDER_SITE_OTHER): Payer: Medicare Other | Admitting: Podiatry

## 2021-01-08 ENCOUNTER — Other Ambulatory Visit: Payer: Self-pay

## 2021-01-08 DIAGNOSIS — H0102A Squamous blepharitis right eye, upper and lower eyelids: Secondary | ICD-10-CM | POA: Diagnosis not present

## 2021-01-08 DIAGNOSIS — G629 Polyneuropathy, unspecified: Secondary | ICD-10-CM

## 2021-01-08 DIAGNOSIS — H5213 Myopia, bilateral: Secondary | ICD-10-CM | POA: Diagnosis not present

## 2021-01-08 DIAGNOSIS — H16223 Keratoconjunctivitis sicca, not specified as Sjogren's, bilateral: Secondary | ICD-10-CM | POA: Diagnosis not present

## 2021-01-08 DIAGNOSIS — H25813 Combined forms of age-related cataract, bilateral: Secondary | ICD-10-CM | POA: Diagnosis not present

## 2021-01-08 DIAGNOSIS — D3131 Benign neoplasm of right choroid: Secondary | ICD-10-CM | POA: Diagnosis not present

## 2021-01-08 DIAGNOSIS — L97511 Non-pressure chronic ulcer of other part of right foot limited to breakdown of skin: Secondary | ICD-10-CM | POA: Diagnosis not present

## 2021-01-08 DIAGNOSIS — H0102B Squamous blepharitis left eye, upper and lower eyelids: Secondary | ICD-10-CM | POA: Diagnosis not present

## 2021-01-14 NOTE — Progress Notes (Signed)
Subjective: [name] [name] is a 55 y.o. is seen today in office s/p right foot ulcer excision, graft application preformed on October 25, 2020.  She presents today for cast removal.  She states that she has been doing well she denies any issues.  No fevers or chills.  No other concerns.  Objective: General: No acute distress, AAOx3  DP/PT pulses palpable 2/4, CRT < 3 sec to all digits.  Right foot: Cast was removed today. Upon evaluation of the wound there is a hyperkeratotic tissue.  Upon debridement there is 1 very small superficial area of pinpoint opening but otherwise the wound is almost completely healed.  There is no surrounding erythema, ascending cellulitis there is no fluctuation crepitation.  No malodor.  No signs of infection.  TMA present. No pain with calf compression, swelling, warmth, erythema.   Assessment and Plan:  Status post right foot ulcer excision, graft application, doing well with no complications   Cast was removed today.  Sharply debrided the hyperkeratotic tissue and complications.  Only 1 small superficial pinpoint opening present.  No signs of infection.  Continue cam boot, nonweightbearing.  Continue daily dressing changes with a small amount of antibiotic ointment.  Monitor closely for any signs or symptoms of infection.  Return in about 2 weeks (around 01/22/2021).  [name] DPM

## 2021-01-16 DIAGNOSIS — Z01812 Encounter for preprocedural laboratory examination: Secondary | ICD-10-CM | POA: Diagnosis not present

## 2021-01-16 DIAGNOSIS — M960 Pseudarthrosis after fusion or arthrodesis: Secondary | ICD-10-CM | POA: Diagnosis not present

## 2021-01-16 DIAGNOSIS — I10 Essential (primary) hypertension: Secondary | ICD-10-CM | POA: Diagnosis not present

## 2021-01-16 DIAGNOSIS — M4010 Other secondary kyphosis, site unspecified: Secondary | ICD-10-CM | POA: Diagnosis not present

## 2021-01-16 DIAGNOSIS — Z01810 Encounter for preprocedural cardiovascular examination: Secondary | ICD-10-CM | POA: Diagnosis not present

## 2021-01-17 DIAGNOSIS — Z20822 Contact with and (suspected) exposure to covid-19: Secondary | ICD-10-CM | POA: Diagnosis not present

## 2021-01-19 DIAGNOSIS — Z20822 Contact with and (suspected) exposure to covid-19: Secondary | ICD-10-CM | POA: Diagnosis not present

## 2021-01-22 ENCOUNTER — Other Ambulatory Visit: Payer: Self-pay

## 2021-01-22 ENCOUNTER — Ambulatory Visit (INDEPENDENT_AMBULATORY_CARE_PROVIDER_SITE_OTHER): Payer: Medicare Other | Admitting: Podiatry

## 2021-01-22 DIAGNOSIS — L97511 Non-pressure chronic ulcer of other part of right foot limited to breakdown of skin: Secondary | ICD-10-CM

## 2021-01-22 DIAGNOSIS — G629 Polyneuropathy, unspecified: Secondary | ICD-10-CM

## 2021-01-26 DIAGNOSIS — G4733 Obstructive sleep apnea (adult) (pediatric): Secondary | ICD-10-CM | POA: Diagnosis not present

## 2021-01-26 DIAGNOSIS — R531 Weakness: Secondary | ICD-10-CM | POA: Diagnosis not present

## 2021-01-26 DIAGNOSIS — M869 Osteomyelitis, unspecified: Secondary | ICD-10-CM | POA: Diagnosis not present

## 2021-01-28 ENCOUNTER — Other Ambulatory Visit: Payer: Self-pay

## 2021-01-28 ENCOUNTER — Encounter: Payer: Self-pay | Admitting: Cardiovascular Disease

## 2021-01-28 ENCOUNTER — Ambulatory Visit: Payer: Medicare Other

## 2021-01-28 ENCOUNTER — Ambulatory Visit (INDEPENDENT_AMBULATORY_CARE_PROVIDER_SITE_OTHER): Payer: Medicare Other | Admitting: Cardiovascular Disease

## 2021-01-28 DIAGNOSIS — R002 Palpitations: Secondary | ICD-10-CM

## 2021-01-28 DIAGNOSIS — I10 Essential (primary) hypertension: Secondary | ICD-10-CM

## 2021-01-28 NOTE — Assessment & Plan Note (Signed)
History of essential hypertension blood pressure measured today 133/80.  She is on metoprolol.

## 2021-01-28 NOTE — Patient Instructions (Signed)
Medication Instructions:  Your physician recommends that you continue on your current medications as directed. Please refer to the Current Medication list given to you today.  *If you need a refill on your cardiac medications before your next appointment, please call your pharmacy*   Testing/Procedures: [facility] Monitor Instructions  Your physician has requested you wear a ZIO patch monitor for 14 days.  This is a single patch monitor. [employer] supplies one patch monitor per enrollment. Additional stickers are not available. Please do not apply patch if you will be having a Nuclear Stress Test,  Echocardiogram, Cardiac CT, MRI, or Chest Xray during the period you would be wearing the  monitor. The patch cannot be worn during these tests. You cannot remove and re-apply the  ZIO XT patch monitor.  Your ZIO patch monitor will be mailed 3 day USPS to your address on file. It may take 3-5 days  to receive your monitor after you have been enrolled.  Once you have received your monitor, please review the enclosed instructions. Your monitor  has already been registered assigning a specific monitor serial # to you.  Billing and Patient Assistance Program Information  We have supplied [employer] with any of your insurance information on file for billing purposes. [employer] offers a sliding scale Patient Assistance Program for patients that do not have  insurance, or whose insurance does not completely cover the cost of the ZIO monitor.  You must apply for the Patient Assistance Program to qualify for this discounted rate.  To apply, please call [employer] at [phone], select option 4, select option 2, ask to apply for  Patient Assistance Program. Theodore Demark will ask your household income, and how many people  are in your household. They will quote your out-of-pocket cost based on that information.  [employer] will also be able to set up a 60-month interest-free payment plan if needed.  Applying  the monitor   Shave hair from upper left chest.  Hold abrader disc by orange tab. Rub abrader in 40 strokes over the upper left chest as  indicated in your monitor instructions.  Clean area with 4 enclosed alcohol pads. Let dry.  Apply patch as indicated in monitor instructions. Patch will be placed under collarbone on left  side of chest with arrow pointing upward.  Rub patch adhesive wings for 2 minutes. Remove white label marked "1". Remove the white  label marked "2". Rub patch adhesive wings for 2 additional minutes.  While looking in a mirror, press and release button in center of patch. A small green light will  flash 3-4 times. This will be your only indicator that the monitor has been turned on.  Do not shower for the first 24 hours. You may shower after the first 24 hours.  Press the button if you feel a symptom. You will hear a small click. Record Date, Time and  Symptom in the Patient Logbook.  When you are ready to remove the patch, follow instructions on the last 2 pages of Patient  Logbook. Stick patch monitor onto the last page of Patient Logbook.  Place Patient Logbook in the blue and white box. Use locking tab on box and tape box closed  securely. The blue and white box has prepaid postage on it. Please place it in the mailbox as  soon as possible. Your physician should have your test results approximately 7 days after the  monitor has been [facility]  Call [facility]  at [phone] if you have questions regarding  your ZIO XT patch monitor. Call them immediately if you see an orange light blinking on your  monitor.  If your monitor falls off in less than 4 days, contact our Monitor department at [phone].  If your monitor becomes loose or falls off after 4 days call [employer] at [phone] for  suggestions on securing your monitor    Follow-Up: At [facility], you and your health needs are our priority.  As part of our  continuing mission to provide you with exceptional heart care, we have created designated Provider Care Teams.  These Care Teams include your primary Cardiologist (physician) and Advanced Practice Providers (APPs -  Physician Assistants and Nurse Practitioners) who all work together to provide you with the care you need, when you need it.  We recommend signing up for the patient portal called "MyChart".  Sign up information is provided on this After Visit Summary.  MyChart is used to connect with patients for Virtual Visits (Telemedicine).  Patients are able to view lab/test results, encounter notes, upcoming appointments, etc.  Non-urgent messages can be sent to your provider as well.   To learn more about what you can do with MyChart, go to [email or web address].    Your next appointment:   We will see you on an as needed basis.  Provider:   Quay Burow, MD

## 2021-01-28 NOTE — Progress Notes (Signed)
Subjective: [name] [name] is a 55 y.o. is seen today in office s/p right foot ulcer excision, graft application preformed on October 25, 2020.  She presents today for ulcer follow-up. She states she has been doing well and she has not noticed any increase in swelling, redness, drainage or any other signs of infection. No fevers/chills.   Objective: General: No acute distress, AAOx3  DP/PT pulses palpable 2/4, CRT < 3 sec to all digits.  Right foot: Pre-ulcerative area noted on the area the wound appears the wound is healed but is still preulcerative.  New skin present along the wound site.  There is no surrounding erythema, ascending cellulitis there is no fluctuation crepitation.  There is no malodor.  No obvious signs of infection are noted.   No pain with calf compression, swelling, warmth, erythema.   Assessment and Plan:  Status post right foot ulcer excision, graft application, doing well with no complications   She has been in the boot and still using crutches.  I like for her to continue with this until she wore the shoes, inserts.  I would like to refer her to follow-up with EJ, our pedorthist to help make sure that they are fitting appropriately and accommodating and offloading her foot.  Moisturizer on the wound site daily. Monitor for any clinical signs or symptoms of infection and directed to call the office immediately should any occur or go to the ER.  No follow-ups on file.  [name] DPM

## 2021-01-28 NOTE — Progress Notes (Unsigned)
Patient enrolled for [employer] to mail a 14 day ZIO XT monitor to her address on file.

## 2021-01-28 NOTE — Assessment & Plan Note (Signed)
Seconds returns because of palpitations.  She has had an event monitor in the past.  These occur randomly.  I am going get a 2-week Zio patch to further evaluate

## 2021-01-28 NOTE — Progress Notes (Signed)
01/28/2021 [name]   [birth year]  [id number]  Primary Physician Pickard, Cammie Mcgee, MD Primary Cardiologist: [name] MD [name] [name], [place]  HPI:  [name] [name] is a 55 y.o. moderately overweight separated Caucasian female mother of 3 children, grandmother of 2 grandchildren who currently does not work. Her primary care provider is Dr. Dennard Schaumann . She was initially referred by the emergency room because of chest pain. Risk factors include treated hypertension and family history. She's never had a heart attack or stroke. She's had chest pain off and on for years more frequent recently. In the ER her EKG showed no acute changes. Enzymes are negative. The pain is brought on by tachypalpitations resulting in chest pain radiating to her jaw with presyncope. A 2-D echocardiogram which was normal and event monitor that showed normal LV function. When I saw her last in the office she had had a syncopal episode in the waiting room and was transported to [facility] where she was observed for 48 hours and discharged. Telemetry was unrevealing. She was begun on low-dose metoprolol she says has improved many of her symptoms.    Since I saw her 3 years ago she is done well.  She gets occasional shortness of breath.  She does complain of occasional palpitations as well.   Current Meds  Medication Sig   AIMOVIG 70 MG/ML SOAJ ADMINISTER 1 ML UNDER THE SKIN EVERY 30 DAYS (Patient taking differently: Inject 1 mL into the skin every 30 (thirty) days.)   albuterol (PROAIR HFA) 108 (90 Base) MCG/ACT inhaler Inhale 2 puffs = 177mcg into the lungs every 6 (six) hours as needed for wheezing or shortness of breath.   busPIRone (BUSPAR) 15 MG tablet Take 15 mg by mouth 3 (three) times daily.    Calcium Carbonate-Vit D-Min (CALCIUM 1200 PO) Take 1,200 mg by mouth daily.    cephALEXin (KEFLEX) 500 MG capsule Take 1 capsule (500 mg total) by mouth 3 (three) times daily.   cyclobenzaprine  (FLEXERIL) 10 MG tablet Take 10 mg by mouth 2 (two) times daily as needed for muscle spasms.    DULoxetine (CYMBALTA) 60 MG capsule TAKE 1 CAPSULE BY MOUTH EVERY DAY   escitalopram (LEXAPRO) 10 MG tablet Take 10 mg by mouth daily.   estradiol (ESTRACE) 1 MG tablet TAKE 1 TABLET AT BEDTIME   fluticasone (FLONASE) 50 MCG/ACT nasal spray SPRAY 2 SPRAYS INTRO EACH NOSTRIL EVERY DAY (Patient taking differently: Place 2 sprays into both nostrils daily.)   haloperidol (HALDOL) 5 MG tablet Take 1 tablet (5 mg total) by mouth at bedtime. For psychosis   levocetirizine (XYZAL) 5 MG tablet TAKE 1 TABLET BY MOUTH EVERY DAY IN THE EVENING   LINZESS 290 MCG CAPS capsule TAKE 1 CAPSULE BY MOUTH EVERY MORNING   meloxicam (MOBIC) 15 MG tablet Take 15 mg by mouth at bedtime.    metoprolol succinate (TOPROL-XL) 25 MG 24 hr tablet TAKE 1 TABLET(25 MG) BY MOUTH DAILY. Keep upcoming appointment   Multiple Vitamins-Minerals (MULTIVITAMIN WITH MINERALS) tablet Take 1 tablet by mouth daily.   OVER THE COUNTER MEDICATION Fennel Seed   oxyCODONE-acetaminophen (PERCOCET) 7.5-325 MG tablet Take 1 tablet by mouth 5 (five) times daily as needed for severe pain.   pantoprazole (PROTONIX) 40 MG tablet TAKE 1 TABLET BY MOUTH TWICE DAILY   potassium chloride SA (KLOR-CON) 20 MEQ tablet TAKE 2 TABLETS BY MOUTH EVERY DAY   prazosin (MINIPRESS) 2 MG capsule Take 2  mg by mouth at bedtime.   pregabalin (LYRICA) 100 MG capsule Take 100 mg by mouth 2 (two) times daily.   solifenacin (VESICARE) 10 MG tablet Take 1 tablet (10 mg total) by mouth daily.   SUMAtriptan (IMITREX) 50 MG tablet TAKE 1 TAB EVERY 2 HOURS AS NEEDED FOR MIGRAINE. MAY REPEAT IN 2 HOURS IF PERSISTS OR RECURS (Patient taking differently: Take 50 mg by mouth every 2 (two) hours as needed for migraine.)   topiramate (TOPAMAX) 50 MG tablet TAKE 1 TABLET BY MOUTH TWICE DAILY   traZODone (DESYREL) 100 MG tablet Take 100 mg by mouth at bedtime.     Allergies  Allergen  Reactions   Other    Penicillins Hives and Rash    No anaphylactic or severe cutaneous Sx > 10 yr ago, no additional medical attention required Tolerates Keflex and Rocephin   Tetracyclines & Related Other (See Comments)    Syncope and put her "in a coma"   Tramadol Other (See Comments)    Seizures   Phenazopyridine Other (See Comments)    Unknown   Tetracycline Other (See Comments)    Syncope and "Put me in a coma"   Ciprofloxacin Rash and Itching   Codeine Itching and Rash   Estradiol Rash    Patches broke out the skin    Social History   Socioeconomic History   Marital status: Widowed    Spouse name: Not on file   Number of children: 3   Years of education: 80   Highest education level: Not on file  Occupational History   Occupation: Disabled  Tobacco Use   Smoking status: Never   Smokeless tobacco: Never  Vaping Use   Vaping Use: Never used  Substance and Sexual Activity   Alcohol use: No   Drug use: No   Sexual activity: Not on file  Other Topics Concern   Not on file  Social History Narrative   Patient drink 4 16oz caffeine drinks a day    Social Determinants of Health   Financial Resource Strain: Low Risk    Difficulty of Paying Living Expenses: Not hard at all  Food Insecurity: No Food Insecurity   Worried About Charity fundraiser in the Last Year: Never true   [place] in the Last Year: Never true  Transportation Needs: No Transportation Needs   Lack of Transportation (Medical): No   Lack of Transportation (Non-Medical): No  Physical Activity: Inactive   Days of Exercise per Week: 0 days   Minutes of Exercise per Session: 0 min  Stress: No Stress Concern Present   Feeling of Stress : Not at all  Social Connections: Socially Isolated   Frequency of Communication with Friends and Family: More than three times a week   Frequency of Social Gatherings with Friends and Family: Twice a week   Attends Religious Services: Never   Corporate treasurer or Organizations: No   Attends Archivist Meetings: Never   Marital Status: Widowed  Human resources officer Violence: Not At Risk   Fear of Current or Ex-Partner: No   Emotionally Abused: No   Physically Abused: No   Sexually Abused: No     Review of Systems: General: negative for chills, fever, night sweats or weight changes.  Cardiovascular: negative for chest pain, dyspnea on exertion, edema, orthopnea, palpitations, paroxysmal nocturnal dyspnea or shortness of breath Dermatological: negative for rash Respiratory: negative for cough or wheezing Urologic: negative for hematuria Abdominal:  negative for nausea, vomiting, diarrhea, bright red blood per rectum, melena, or hematemesis Neurologic: negative for visual changes, syncope, or dizziness All other systems reviewed and are otherwise negative except as noted above.    Blood pressure 133/80, pulse 73, height 5\' 4"  (1.626 m), weight 229 lb 6.4 oz (104.1 kg), SpO2 97 %.  General appearance: alert and no distress Neck: no adenopathy, no carotid bruit, no JVD, supple, symmetrical, trachea midline, and thyroid not enlarged, symmetric, no tenderness/mass/nodules Lungs: clear to auscultation bilaterally Heart: regular rate and rhythm, S1, S2 normal, no murmur, click, rub or gallop Extremities: extremities normal, atraumatic, no cyanosis or edema Pulses: 2+ and symmetric Skin: Skin color, texture, turgor normal. No rashes or lesions Neurologic: Grossly normal  EKG sinus rhythm at 73 without ST or T wave changes.  I personally reviewed this EKG.  ASSESSMENT AND PLAN:   Palpitations Seconds returns because of palpitations.  She has had an event monitor in the past.  These occur randomly.  I am going get a 2-week Zio patch to further evaluate  Essential hypertension History of essential hypertension blood pressure measured today 133/80.  She is on metoprolol.     [name] MD FACP,FACC,FAHA,  [facility] 01/28/2021 3:14 PM

## 2021-01-29 DIAGNOSIS — M79673 Pain in unspecified foot: Secondary | ICD-10-CM | POA: Diagnosis not present

## 2021-01-29 DIAGNOSIS — Z79899 Other long term (current) drug therapy: Secondary | ICD-10-CM | POA: Diagnosis not present

## 2021-01-29 DIAGNOSIS — G894 Chronic pain syndrome: Secondary | ICD-10-CM | POA: Diagnosis not present

## 2021-01-29 DIAGNOSIS — M533 Sacrococcygeal disorders, not elsewhere classified: Secondary | ICD-10-CM | POA: Diagnosis not present

## 2021-01-29 DIAGNOSIS — M47816 Spondylosis without myelopathy or radiculopathy, lumbar region: Secondary | ICD-10-CM | POA: Diagnosis not present

## 2021-01-29 DIAGNOSIS — Z79891 Long term (current) use of opiate analgesic: Secondary | ICD-10-CM | POA: Diagnosis not present

## 2021-01-30 ENCOUNTER — Other Ambulatory Visit: Payer: Medicare Other

## 2021-01-30 ENCOUNTER — Other Ambulatory Visit: Payer: Self-pay

## 2021-01-30 DIAGNOSIS — Z89411 Acquired absence of right great toe: Secondary | ICD-10-CM | POA: Diagnosis not present

## 2021-01-30 DIAGNOSIS — Z89421 Acquired absence of other right toe(s): Secondary | ICD-10-CM | POA: Diagnosis not present

## 2021-01-30 DIAGNOSIS — G629 Polyneuropathy, unspecified: Secondary | ICD-10-CM | POA: Diagnosis not present

## 2021-02-03 DIAGNOSIS — M47815 Spondylosis without myelopathy or radiculopathy, thoracolumbar region: Secondary | ICD-10-CM | POA: Diagnosis not present

## 2021-02-03 DIAGNOSIS — M869 Osteomyelitis, unspecified: Secondary | ICD-10-CM | POA: Diagnosis not present

## 2021-02-03 DIAGNOSIS — R531 Weakness: Secondary | ICD-10-CM | POA: Diagnosis not present

## 2021-02-03 DIAGNOSIS — G4733 Obstructive sleep apnea (adult) (pediatric): Secondary | ICD-10-CM | POA: Diagnosis not present

## 2021-02-03 DIAGNOSIS — T84216A Breakdown (mechanical) of internal fixation device of vertebrae, initial encounter: Secondary | ICD-10-CM | POA: Diagnosis not present

## 2021-02-03 DIAGNOSIS — Z981 Arthrodesis status: Secondary | ICD-10-CM | POA: Diagnosis not present

## 2021-02-03 DIAGNOSIS — R2689 Other abnormalities of gait and mobility: Secondary | ICD-10-CM | POA: Diagnosis not present

## 2021-02-03 DIAGNOSIS — G9600 Cerebrospinal fluid leak, unspecified: Secondary | ICD-10-CM | POA: Diagnosis not present

## 2021-02-03 DIAGNOSIS — M545 Low back pain, unspecified: Secondary | ICD-10-CM | POA: Diagnosis not present

## 2021-02-03 DIAGNOSIS — M5106 Intervertebral disc disorders with myelopathy, lumbar region: Secondary | ICD-10-CM | POA: Diagnosis not present

## 2021-02-03 DIAGNOSIS — M532X6 Spinal instabilities, lumbar region: Secondary | ICD-10-CM | POA: Diagnosis not present

## 2021-02-03 DIAGNOSIS — M960 Pseudarthrosis after fusion or arthrodesis: Secondary | ICD-10-CM | POA: Diagnosis not present

## 2021-02-03 DIAGNOSIS — Z79899 Other long term (current) drug therapy: Secondary | ICD-10-CM | POA: Diagnosis not present

## 2021-02-03 DIAGNOSIS — M4010 Other secondary kyphosis, site unspecified: Secondary | ICD-10-CM | POA: Diagnosis not present

## 2021-02-03 DIAGNOSIS — M4326 Fusion of spine, lumbar region: Secondary | ICD-10-CM | POA: Diagnosis not present

## 2021-02-17 DIAGNOSIS — Z981 Arthrodesis status: Secondary | ICD-10-CM | POA: Diagnosis not present

## 2021-02-17 DIAGNOSIS — M960 Pseudarthrosis after fusion or arthrodesis: Secondary | ICD-10-CM | POA: Diagnosis not present

## 2021-02-20 ENCOUNTER — Other Ambulatory Visit: Payer: Self-pay | Admitting: Podiatry

## 2021-02-20 ENCOUNTER — Telehealth: Payer: Self-pay | Admitting: *Deleted

## 2021-02-20 ENCOUNTER — Ambulatory Visit: Payer: Medicare Other | Admitting: Podiatry

## 2021-02-20 MED ORDER — CEPHALEXIN 500 MG PO CAPS: 500.0000 mg | ORAL_CAPSULE | Freq: Three times a day (TID) | ORAL | 0 refills | Status: DC

## 2021-02-20 NOTE — Telephone Encounter (Signed)
Rx Keflex 500 TID #30 sent to [facility] in [place] on [address] This is a refill Dr. Jacqualyn Posey sent on 11/18/2020.

## 2021-02-20 NOTE — Telephone Encounter (Signed)
Called patient for sooner appointment but she will wait til Monday w/ Dr Jacqualyn Posey but requesting an antibiotic this weekend.Please advise.

## 2021-02-20 NOTE — Telephone Encounter (Signed)
Patient is calling because her foot has another ulcer,has a foul odor when taking off bandages. Should she be prescribed an antibiotic? Please advise. Returned call to patient to schedule for an appointment, no answer, left vmessage for call back.

## 2021-02-21 ENCOUNTER — Other Ambulatory Visit: Payer: Self-pay | Admitting: Cardiovascular Disease

## 2021-02-23 ENCOUNTER — Ambulatory Visit (INDEPENDENT_AMBULATORY_CARE_PROVIDER_SITE_OTHER): Payer: Medicare Other

## 2021-02-23 ENCOUNTER — Ambulatory Visit (INDEPENDENT_AMBULATORY_CARE_PROVIDER_SITE_OTHER): Payer: Medicare Other | Admitting: Podiatry

## 2021-02-23 ENCOUNTER — Other Ambulatory Visit: Payer: Self-pay | Admitting: Cardiovascular Disease

## 2021-02-23 ENCOUNTER — Ambulatory Visit: Payer: Medicare Other | Admitting: Podiatry

## 2021-02-23 ENCOUNTER — Other Ambulatory Visit: Payer: Self-pay

## 2021-02-23 DIAGNOSIS — L03115 Cellulitis of right lower limb: Secondary | ICD-10-CM | POA: Diagnosis not present

## 2021-02-23 DIAGNOSIS — L97512 Non-pressure chronic ulcer of other part of right foot with fat layer exposed: Secondary | ICD-10-CM | POA: Diagnosis not present

## 2021-02-23 DIAGNOSIS — L97511 Non-pressure chronic ulcer of other part of right foot limited to breakdown of skin: Secondary | ICD-10-CM | POA: Diagnosis not present

## 2021-02-25 NOTE — Progress Notes (Signed)
Subjective: [name] [name] is a 55 y.o. is seen today for evaluation of a recurrent wound on her right foot.  She called on Friday and she had redness and drainage and she started antibiotics and she states that since the antibiotic she is doing much better.  She denies any fevers or chills.  Recently had back surgery.  She is back to wearing regular shoe.  Objective: General: No acute distress, AAOx3  DP/PT pulses palpable 2/4, CRT < 3 sec to all digits.  Right foot: Reoccurrence of the open wound present on the right foot.  Wound measures 0.6 x 0.5 x 0.2 cm with a granular wound base without any probing to bone, no undermining or tunneling.  Mild surrounding erythema without any ascending cellulitis.  No fluctuance or crepitation noted.  No pain with calf compression, swelling, warmth, erythema.   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  Status post transmetatarsal amputation with any evidence of acute fracture, osteomyelitis or soft tissue emphysema.  At this point I want her to continue with Medihoney dressing changes daily.  I did sharply debride the wound today utilizing the 312 with scalpel to healthy, bleeding tissue remove any nonviable devitalized tissue to help promote wound healing.  The pre and post wound measurements were the same.  Minimal blood loss and hemostasis achieved through manual compression.  Medihoney and dry sterile dressing was applied.  I return back to the cam boot as well for offloading.  Elevation and limit activity.  Due to the reoccurrence of the wound I discussed with her long-term possible surgical intervention to shave down any prominent bone underneath this area. Finish course of antibiotics for cellulitis.  [name] DPM

## 2021-02-26 DIAGNOSIS — M869 Osteomyelitis, unspecified: Secondary | ICD-10-CM | POA: Diagnosis not present

## 2021-02-26 DIAGNOSIS — G4733 Obstructive sleep apnea (adult) (pediatric): Secondary | ICD-10-CM | POA: Diagnosis not present

## 2021-02-26 DIAGNOSIS — R531 Weakness: Secondary | ICD-10-CM | POA: Diagnosis not present

## 2021-03-02 DIAGNOSIS — M47816 Spondylosis without myelopathy or radiculopathy, lumbar region: Secondary | ICD-10-CM | POA: Diagnosis not present

## 2021-03-02 DIAGNOSIS — M5030 Other cervical disc degeneration, unspecified cervical region: Secondary | ICD-10-CM | POA: Diagnosis not present

## 2021-03-02 DIAGNOSIS — M961 Postlaminectomy syndrome, not elsewhere classified: Secondary | ICD-10-CM | POA: Diagnosis not present

## 2021-03-02 DIAGNOSIS — M533 Sacrococcygeal disorders, not elsewhere classified: Secondary | ICD-10-CM | POA: Diagnosis not present

## 2021-03-06 ENCOUNTER — Ambulatory Visit (INDEPENDENT_AMBULATORY_CARE_PROVIDER_SITE_OTHER): Payer: Medicare Other | Admitting: Podiatry

## 2021-03-06 ENCOUNTER — Other Ambulatory Visit: Payer: Self-pay

## 2021-03-06 ENCOUNTER — Ambulatory Visit (INDEPENDENT_AMBULATORY_CARE_PROVIDER_SITE_OTHER): Payer: Medicare Other

## 2021-03-06 VITALS — Temp 97.9°F

## 2021-03-06 DIAGNOSIS — M898X9 Other specified disorders of bone, unspecified site: Secondary | ICD-10-CM | POA: Diagnosis not present

## 2021-03-06 DIAGNOSIS — L97512 Non-pressure chronic ulcer of other part of right foot with fat layer exposed: Secondary | ICD-10-CM

## 2021-03-06 NOTE — Patient Instructions (Signed)

## 2021-03-09 NOTE — Progress Notes (Signed)
Subjective: [name] [name] is a 55 y.o. is seen today for follow-up evaluation of a recurrent wound on her right foot.  She is also incision proceed with the surgery.  The wound is doing better but still remains.  She has not seen any swelling or redness or any drainage and no increase of redness.  No fevers or chills.  No other concerns.  Objective: General: No acute distress, AAOx3  DP/PT pulses palpable 2/4, CRT < 3 sec to all digits.  Right foot: Reoccurrence of the open wound present on the right foot.  Wound measures 0.5 x 0.4 x 0.1 cm with a granular wound base without any probing to bone, no undermining or tunneling.  Surrounding erythema has resolved.  Mild hyperkeratotic periwound.  No fluctuation or crepitation but there is no malodor. No pain with calf compression, swelling, warmth, erythema.   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  She is attempted conservative care the wound healed but has come back as she started putting weight on it.  I repeated the x-rays today with a skin marker.  I do think coming from a prominent bone causing the irritation.  After discussion she was could proceed with surgery.  Discussed with her that there was several incision exostectomy the underlying bone in order to help limit pressure or decrease pressure.  She understands the risks which we discussed. -We will plan on exostectomy of metatarsal With wound healing. -The incision placement as well as the postoperative course was discussed with the patient. I discussed risks of the surgery which include, but not limited to, infection, bleeding, pain, swelling, need for further surgery, delayed or nonhealing, painful or ugly scar, numbness or sensation changes, over/under correction, recurrence, transfer lesions, further deformity, hardware failure, DVT/PE, loss of toe/foot. Patient understands these  risks and wishes to proceed with surgery. The surgical consent was reviewed with the patient all 3 pages were signed. No promises or guarantees were given to the outcome of the procedure. All questions were answered to the best of my ability. Before the surgery the patient was encouraged to call the office if there is any further questions. The surgery will be performed at the [facility] on an outpatient basis. -Continue the dressing changes with Medihoney for now. Continue offloading  [name] DPM

## 2021-03-10 ENCOUNTER — Telehealth: Payer: Self-pay | Admitting: Urology

## 2021-03-10 ENCOUNTER — Telehealth: Payer: Self-pay | Admitting: Cardiovascular Disease

## 2021-03-10 DIAGNOSIS — M545 Low back pain, unspecified: Secondary | ICD-10-CM | POA: Diagnosis not present

## 2021-03-10 NOTE — Telephone Encounter (Signed)
Calling to say that she wasn't able to wear the heart monitor. Please advise

## 2021-03-10 NOTE — Telephone Encounter (Signed)
DOS - 03/18/21  EXOSTECTOMY METATARSAL RIGHT --- 48185  [employer] EFFECTIVE DATE - 09/17/20   PLAN DEDUCTIBLE - $0.00 OUT OF POCKET - $7,550.00 W/ $7,550.00 REMAINING COINSURANCE - 20% COPAY - $0.00   PER [employer] WEBSITE FOR CPT CODE 90931 Notification or Prior Authorization is not required for the requested services  Decision ID #:[id number]

## 2021-03-10 NOTE — Telephone Encounter (Signed)
Attempt to return call to discuss-call cannot be completed at this time.

## 2021-03-18 ENCOUNTER — Encounter: Payer: Self-pay | Admitting: Podiatry

## 2021-03-18 ENCOUNTER — Other Ambulatory Visit: Payer: Self-pay | Admitting: Podiatry

## 2021-03-18 DIAGNOSIS — M25774 Osteophyte, right foot: Secondary | ICD-10-CM | POA: Diagnosis not present

## 2021-03-18 MED ORDER — CEPHALEXIN 500 MG PO CAPS: 500.0000 mg | ORAL_CAPSULE | Freq: Three times a day (TID) | ORAL | 0 refills | Status: DC

## 2021-03-18 MED ORDER — PROMETHAZINE HCL 25 MG PO TABS: 25.0000 mg | ORAL_TABLET | Freq: Three times a day (TID) | ORAL | 0 refills | Status: DC | PRN

## 2021-03-20 ENCOUNTER — Other Ambulatory Visit: Payer: Self-pay

## 2021-03-20 ENCOUNTER — Telehealth: Payer: Self-pay | Admitting: *Deleted

## 2021-03-20 ENCOUNTER — Ambulatory Visit (INDEPENDENT_AMBULATORY_CARE_PROVIDER_SITE_OTHER): Payer: Medicare Other | Admitting: Podiatry

## 2021-03-20 VITALS — BP 107/92 | HR 82 | Temp 97.4°F

## 2021-03-20 DIAGNOSIS — M898X9 Other specified disorders of bone, unspecified site: Secondary | ICD-10-CM

## 2021-03-20 DIAGNOSIS — R58 Hemorrhage, not elsewhere classified: Secondary | ICD-10-CM

## 2021-03-20 DIAGNOSIS — Z9889 Other specified postprocedural states: Secondary | ICD-10-CM

## 2021-03-20 DIAGNOSIS — L97512 Non-pressure chronic ulcer of other part of right foot with fat layer exposed: Secondary | ICD-10-CM

## 2021-03-20 NOTE — Telephone Encounter (Signed)
Patient is calling with concerns that her procedural foot is bleeding some, a place on the bottom is the size of her fist. Should she be seen today or wait until upcoming appointment on Tuesday. Please advise.

## 2021-03-20 NOTE — Telephone Encounter (Signed)
Returned the call to patient to come in ASAP today before 12:00 to be re evaluated by physician ,stated that she will do her best to get here.

## 2021-03-23 NOTE — Progress Notes (Signed)
Subjective: [name] [name] is a 55 y.o. is seen today in office s/p right foot exostectomy preformed on 03/18/2021.  She presents today for acute appointment she has some bleeding on the bandage last night.  No injury or falls that she reports.  Her pain is controlled. Denies any systemic complaints such as fevers, chills, nausea, vomiting. No calf pain, chest pain, shortness of breath.   Objective: General: No acute distress, AAOx3  DP/PT pulses palpable 2/4, CRT < 3 sec to all digits.  Right foot: Incision is well coapted without any evidence of dehiscence with sutures intact.  There are some macerated tissue and there is some bloody drainage on the bandage there is no active bleeding identified.  Minimal edema.  No significant increase in erythema there is no ascending cellulitis.  No fluctuation or crepitation but there is no malodor.  Superficial wound present without any probing, undermining or tunneling.  No other areas of tenderness to bilateral lower extremities.  No other open lesions or pre-ulcerative lesions.  No pain with calf compression, swelling, warmth, erythema.   Assessment and Plan:  Status post right foot surgery, presents due to bleeding.  -Treatment options discussed including all alternatives, risks, and complications -Dressing was changed today.  Betadine was applied to the wound followed by dry sterile dressing.  Continue cam boot, offloading.  I will see her back on Monday for her regular scheduled appointment for further evaluation. -Ice/elevation -Pain medication as needed. -Monitor for any clinical signs or symptoms of infection and DVT/PE and directed to call the office immediately should any occur or go to the ER. -Follow-up as scheduled or sooner if any problems arise. In the meantime, encouraged to call the office with any questions, concerns, change in symptoms.   Celesta Gentile, DPM

## 2021-03-24 ENCOUNTER — Ambulatory Visit (INDEPENDENT_AMBULATORY_CARE_PROVIDER_SITE_OTHER): Payer: Medicare Other

## 2021-03-24 ENCOUNTER — Ambulatory Visit (INDEPENDENT_AMBULATORY_CARE_PROVIDER_SITE_OTHER): Payer: Medicare Other | Admitting: Podiatry

## 2021-03-24 ENCOUNTER — Encounter: Payer: Self-pay | Admitting: Podiatry

## 2021-03-24 ENCOUNTER — Other Ambulatory Visit: Payer: Self-pay

## 2021-03-24 ENCOUNTER — Telehealth: Payer: Self-pay | Admitting: *Deleted

## 2021-03-24 DIAGNOSIS — L97512 Non-pressure chronic ulcer of other part of right foot with fat layer exposed: Secondary | ICD-10-CM

## 2021-03-24 DIAGNOSIS — M898X9 Other specified disorders of bone, unspecified site: Secondary | ICD-10-CM

## 2021-03-24 DIAGNOSIS — Z9889 Other specified postprocedural states: Secondary | ICD-10-CM

## 2021-03-24 NOTE — Progress Notes (Signed)
Subjective: [name] [name] is a 55 y.o. is seen today in office s/p right foot exostectomy preformed on 03/18/2021.  States that she been doing well.  She is change the dressing 1 time due to some bleeding but not significant.  No pain.  Denies any fevers or chills.  She has no other concerns today.    Objective: General: No acute distress, AAOx3  DP/PT pulses palpable 2/4, CRT < 3 sec to all digits.  Right foot: Incision is well coapted without any evidence of dehiscence with sutures intact.  There was some mild bloody drainage on the bandage but not as significant as Friday.  The macerated tissue is much improved.  The incisions are well coapted without any dehiscence.  There is decreased edema.  No ascending cellulitis.  No fluctuation or crepitation.  No malodor. No other open lesions or pre-ulcerative lesions.  No pain with calf compression, swelling, warmth, erythema.   Assessment and Plan:  Status post right foot surgery  -Treatment options discussed including all alternatives, risks, and complications -X-rays obtained reviewed.  No evidence of acute fracture.  Status post resection of distal portion of the second metatarsal without any complicating factors. -Betadine was made over the incision followed by dry sterile dressing.  Continue dressing changes every other day and offloading.  Continue cam boot.  Nonweightbearing recommended -Monitor for any clinical signs or symptoms of infection and DVT/PE and directed to call the office immediately should any occur or go to the ER. -Follow-up as scheduled or sooner if any problems arise. In the meantime, encouraged to call the office with any questions, concerns, change in symptoms.   Celesta Gentile, DPM

## 2021-03-24 NOTE — Telephone Encounter (Signed)
Tried calling pt x2, received a message "your call can not be completed at this time."

## 2021-03-24 NOTE — Telephone Encounter (Signed)
Called and spoke with the patient on 03-19-2021 and stated that I was calling to see how the patient was doing after having surgery with Dr Jacqualyn Posey on 03/18/2021 and patient stated that she was doing ok and that there was some nausea due to the anesthesia and that the pain was ok and to call the office if any concerns or questions. Lattie Haw

## 2021-03-25 NOTE — Telephone Encounter (Signed)
Unable to reach pt or leave a message  

## 2021-03-28 DIAGNOSIS — M869 Osteomyelitis, unspecified: Secondary | ICD-10-CM | POA: Diagnosis not present

## 2021-03-28 DIAGNOSIS — R531 Weakness: Secondary | ICD-10-CM | POA: Diagnosis not present

## 2021-03-28 DIAGNOSIS — G4733 Obstructive sleep apnea (adult) (pediatric): Secondary | ICD-10-CM | POA: Diagnosis not present

## 2021-04-02 ENCOUNTER — Ambulatory Visit (INDEPENDENT_AMBULATORY_CARE_PROVIDER_SITE_OTHER): Payer: Medicare Other | Admitting: Podiatry

## 2021-04-02 ENCOUNTER — Other Ambulatory Visit: Payer: Self-pay

## 2021-04-02 DIAGNOSIS — Z9889 Other specified postprocedural states: Secondary | ICD-10-CM

## 2021-04-02 DIAGNOSIS — M5030 Other cervical disc degeneration, unspecified cervical region: Secondary | ICD-10-CM | POA: Diagnosis not present

## 2021-04-02 DIAGNOSIS — L97512 Non-pressure chronic ulcer of other part of right foot with fat layer exposed: Secondary | ICD-10-CM

## 2021-04-02 DIAGNOSIS — G629 Polyneuropathy, unspecified: Secondary | ICD-10-CM | POA: Diagnosis not present

## 2021-04-02 DIAGNOSIS — M47816 Spondylosis without myelopathy or radiculopathy, lumbar region: Secondary | ICD-10-CM | POA: Diagnosis not present

## 2021-04-02 DIAGNOSIS — M898X9 Other specified disorders of bone, unspecified site: Secondary | ICD-10-CM

## 2021-04-02 DIAGNOSIS — M533 Sacrococcygeal disorders, not elsewhere classified: Secondary | ICD-10-CM | POA: Diagnosis not present

## 2021-04-02 MED ORDER — CEPHALEXIN 500 MG PO CAPS: 500.0000 mg | ORAL_CAPSULE | Freq: Three times a day (TID) | ORAL | 0 refills | Status: DC

## 2021-04-05 NOTE — Progress Notes (Signed)
Subjective: [name] [name] is a 55 y.o. is seen today in office s/p right foot exostectomy preformed on 03/18/2021.  She presents today for suture removal.  She states that overall she has been doing well.  No fevers or chills.  She is also interested off the foot as much as possible and using the cam boot.   Objective: General: No acute distress, AAOx3  DP/PT pulses palpable 2/4, CRT < 3 sec to all digits.  Right foot: Incision is well coapted without any evidence of dehiscence with sutures intact.  There are some faint erythema along the suture site there is no drainage or pus.  No active bleeding identified.  There is no fluctuation or crepitation and there is no malodor.  No pain although she does have significant neuropathy.  The wound itself that was present prior to surgery has almost completely healed.  Superficial area skin breakdown there is no probing.  No drainage or pus from this area no surrounding erythema to this wound. No other open lesions or pre-ulcerative lesions.  No pain with calf compression, swelling, warmth, erythema.   Assessment and Plan:  Status post right foot surgery  -Treatment options discussed including all alternatives, risks, and complications -I remove the majority the sutures were left 1 suture intact as the central portion incision still has some motion across it.  Due to the mild erythema I think is more from the sutures from soap and restart cephalexin.  Continue elevation, nonweightbearing in the cam boot.  Plan remove the sutures early next week.  [name] DPM

## 2021-04-07 ENCOUNTER — Encounter: Payer: Medicare Other | Admitting: Podiatry

## 2021-04-09 ENCOUNTER — Ambulatory Visit (INDEPENDENT_AMBULATORY_CARE_PROVIDER_SITE_OTHER): Payer: Medicare Other | Admitting: Podiatry

## 2021-04-09 ENCOUNTER — Telehealth: Payer: Self-pay | Admitting: *Deleted

## 2021-04-09 ENCOUNTER — Other Ambulatory Visit: Payer: Self-pay

## 2021-04-09 DIAGNOSIS — L97511 Non-pressure chronic ulcer of other part of right foot limited to breakdown of skin: Secondary | ICD-10-CM

## 2021-04-09 DIAGNOSIS — Z9889 Other specified postprocedural states: Secondary | ICD-10-CM

## 2021-04-09 MED ORDER — CEPHALEXIN 500 MG PO CAPS: 500.0000 mg | ORAL_CAPSULE | Freq: Three times a day (TID) | ORAL | 0 refills | Status: DC

## 2021-04-09 NOTE — Progress Notes (Signed)
Subjective: [name] [name] is a 55 y.o. is seen today in office s/p right foot exostectomy preformed on 03/18/2021.  She presents today remove the remainder of the sutures.  She has had some bleeding and concern for possible pus.  Had some localized swelling or redness but no red streaks.  She denies any fevers or chills.  She has no nausea or vomiting.  She has no other concerns.   She has been in the cam boot using crutches but she is having right shoulder pain from an injury that she sustained previously.  She has been seen by pain management for this.   Objective: General: No acute distress, AAOx3  DP/PT pulses palpable 2/4, CRT < 3 sec to all digits.  Right foot: 1 remainder of the suture still intact and there is some localized edema and erythema.  Some clear drainage coming from the suture there is no purulence.  On the incision site superficial opening present but starting to scab over.  Measures 1 x 0.3 x 0.1 cm and is dry.  There is no drainage or pus.  There is no fluctuance crepitation there is no malodor.  No active bleeding noted.  No probing, undermining or tunneling No other open lesions or pre-ulcerative lesions.  No pain with calf compression, swelling, warmth, erythema.   Assessment and Plan:  Status post right foot surgery  -Treatment options discussed including all alternatives, risks, and complications -I remove the last stitch today.  No purulence identified, clear drainage from the suture closed with suture reaction.  Silvadene was applied followed by dressing.  I ordered wound care supplies through [facility] which was faxed over today as well.  Continue cam boot.  Discussed she can weight-bear as tolerated in the cam boot given her shoulder issues.  Given the concern for residual edema and erythema when her continue cephalexin for 1 more week. -Monitor for any clinical signs or symptoms of infection and directed to call the office immediately should any occur or go to the  ER.  No follow-ups on file.  [name] DPM

## 2021-04-10 DIAGNOSIS — L97511 Non-pressure chronic ulcer of other part of right foot limited to breakdown of skin: Secondary | ICD-10-CM | POA: Diagnosis not present

## 2021-04-14 ENCOUNTER — Other Ambulatory Visit: Payer: Self-pay | Admitting: Family Medicine

## 2021-04-14 ENCOUNTER — Telehealth: Payer: Self-pay

## 2021-04-14 MED ORDER — IVERMECTIN 0.5 % EX LOTN
1.0000 | TOPICAL_LOTION | Freq: Once | CUTANEOUS | 1 refills | Status: AC
Start: 2021-04-14 — End: 2021-04-14

## 2021-04-14 NOTE — Telephone Encounter (Signed)
Spoke with pt and advised of rx. She states she is not sure her ins will cover the Ivermectin. Advised pt she can check with her pharmacy and let us know. Nothing further needed at this time.

## 2021-04-14 NOTE — Telephone Encounter (Signed)
Pt called to request rx for head lice.  Please advise, thanks!

## 2021-04-16 ENCOUNTER — Other Ambulatory Visit: Payer: Self-pay | Admitting: Physician Assistant

## 2021-04-16 ENCOUNTER — Encounter: Payer: Medicare Other | Admitting: Podiatry

## 2021-04-16 DIAGNOSIS — M25511 Pain in right shoulder: Secondary | ICD-10-CM

## 2021-04-17 ENCOUNTER — Emergency Department (HOSPITAL_COMMUNITY): Payer: Medicare Other

## 2021-04-17 ENCOUNTER — Emergency Department (HOSPITAL_COMMUNITY)
Admission: EM | Admit: 2021-04-17 | Discharge: 2021-04-18 | Disposition: A | Discharge status: Home / Self Care | Payer: Medicare Other | Attending: Emergency Medicine | Admitting: Emergency Medicine

## 2021-04-17 ENCOUNTER — Other Ambulatory Visit: Payer: Self-pay

## 2021-04-17 DIAGNOSIS — M25511 Pain in right shoulder: Secondary | ICD-10-CM | POA: Diagnosis not present

## 2021-04-17 DIAGNOSIS — Z7951 Long term (current) use of inhaled steroids: Secondary | ICD-10-CM | POA: Insufficient documentation

## 2021-04-17 DIAGNOSIS — R0902 Hypoxemia: Secondary | ICD-10-CM | POA: Diagnosis not present

## 2021-04-17 DIAGNOSIS — S4991XA Unspecified injury of right shoulder and upper arm, initial encounter: Secondary | ICD-10-CM | POA: Diagnosis not present

## 2021-04-17 DIAGNOSIS — Z79899 Other long term (current) drug therapy: Secondary | ICD-10-CM | POA: Diagnosis not present

## 2021-04-17 DIAGNOSIS — J4520 Mild intermittent asthma, uncomplicated: Secondary | ICD-10-CM | POA: Insufficient documentation

## 2021-04-17 DIAGNOSIS — R6889 Other general symptoms and signs: Secondary | ICD-10-CM | POA: Diagnosis not present

## 2021-04-17 DIAGNOSIS — M24811 Other specific joint derangements of right shoulder, not elsewhere classified: Secondary | ICD-10-CM | POA: Diagnosis not present

## 2021-04-17 DIAGNOSIS — I10 Essential (primary) hypertension: Secondary | ICD-10-CM | POA: Diagnosis not present

## 2021-04-17 DIAGNOSIS — M25519 Pain in unspecified shoulder: Secondary | ICD-10-CM | POA: Diagnosis not present

## 2021-04-17 DIAGNOSIS — Z743 Need for continuous supervision: Secondary | ICD-10-CM | POA: Diagnosis not present

## 2021-04-17 MED ORDER — IBUPROFEN 400 MG PO TABS
600.0000 mg | ORAL_TABLET | Freq: Once | ORAL | Status: DC
  Filled 2021-04-17: qty 1

## 2021-04-17 NOTE — ED Triage Notes (Signed)
BIB [facility] from home, shoulder injury September, with negative xray at that time. Today pain returned when she was pushing herself up, her popping noise, immobilized by EMS. Tender to touch, no notable deformity. 100 fent without relief, ketamine 20 mg with relief. 20G Left AC. EMS VS BP 144/88, 74PR, RR 10, SPO2 97%.

## 2021-04-17 NOTE — ED Provider Notes (Signed)
Emergency Medicine Provider Triage Evaluation Note  [name] [name] , a 56 y.o. female  was evaluated in triage.  Pt complains of right shoulder pain that occurred just prior to arrival.  Patient states she injured her shoulder back in September.  Today she was trying to lift herself up and upon pushing down with her hand she felt a ripping sensation in the shoulder followed by immediate severe pain.  Review of Systems  Positive:  Negative: See above   Physical Exam  BP 138/86    Pulse 81    Temp 98.6 F (37 C) (Oral)    Resp 16    SpO2 96%  Gen:   Awake, no distress   Resp:  Normal effort  MSK:   Unable to move right shoulder secondary to pain.  Diffuse tenderness over the right shoulder and humerus. Other:    Medical Decision Making  Medically screening exam initiated at 9:43 PM.  Appropriate orders placed.  [name] [name] was informed that the remainder of the evaluation will be completed by another provider, this initial triage assessment does not replace that evaluation, and the importance of remaining in the ED until their evaluation is complete.     Hendricks Limes, PA-C 04/17/21 2144    Lorelle Gibbs, DO 04/18/21 [phone]

## 2021-04-18 ENCOUNTER — Emergency Department (HOSPITAL_COMMUNITY): Payer: Medicare Other

## 2021-04-18 DIAGNOSIS — M24811 Other specific joint derangements of right shoulder, not elsewhere classified: Secondary | ICD-10-CM | POA: Diagnosis not present

## 2021-04-18 DIAGNOSIS — M25511 Pain in right shoulder: Secondary | ICD-10-CM | POA: Diagnosis not present

## 2021-04-18 MED ORDER — HYDROMORPHONE HCL 1 MG/ML IJ SOLN
1.0000 mg | Freq: Once | INTRAMUSCULAR | Status: AC
  Administered 2021-04-18: 1 mg via INTRAVENOUS
  Filled 2021-04-18: qty 1

## 2021-04-18 NOTE — Discharge Instructions (Signed)
1.  Wear your sling for comfort.  When possible, start removing the sling to let your shoulder hang and make small circles with your arm to keep the shoulder from getting too stiff. 2.  Schedule a follow-up appointment with the orthopedic doctor soon as possible.  The physician on call is listed in your discharge instructions.  You wish to choose your own orthopedic doctor for follow-up, make a call on the next business day to schedule appointment as soon as possible 3.  Continue your home pain medications for additional pain control.  You may also apply well wrapped ice packs.  After several days warm moist heat may feel good.

## 2021-04-18 NOTE — Progress Notes (Signed)
Orthopedic Tech Progress Note Patient Details:  [name] [name] Jan 07, 1966 [id number]  Ortho Devices Type of Ortho Device: Shoulder immobilizer Ortho Device/Splint Location: Right arm Ortho Device/Splint Interventions: Application   Post Interventions Patient Tolerated: Well  Linus Salmons Lesette Frary 04/18/2021, 1:06 PM

## 2021-04-18 NOTE — ED Provider Notes (Signed)
[facility] EMERGENCY DEPARTMENT Provider Note   CSN: [id number] Arrival date & time: 04/17/21  2037     History Chief Complaint  Patient presents with   Shoulder Pain    [name] [name] is a 55 y.o. female.  HPI Patient reports that she was using both of her arms to lift her weight out of her chair yesterday at about 6:30 PM.  She was trying to push herself up forward and suddenly got an intense ripping feeling in her right shoulder.  She reports it hurts all the way over the top of her shoulder part way down her upper arm.  She reports the pain is severe and she cannot raise her arm.  She reports is a constant intense pain.  No associated fall or other injury.  Patient reports that she injured the shoulder in September from a fall.  She was evaluated in the emergency department and no specific injury identified.  Patient reports she did not have any orthopedic follow-up but has had some chronic pain in the shoulder since then anyways.  She reports she did not do any follow-up because she subsequently had to get lumbar spine surgery and right foot surgery.  She did not have time to attend to the shoulder.  Patient reports she does chronically take pain medications for her other conditions but is not helping with the pain from her injury last night.    Past Medical History:  Diagnosis Date   Anxiety    Arthritis    Phreesia 02/08/2020   Asthma    mild intermittent   Asthma    Phreesia 02/08/2020   Bipolar 1 disorder ([place])    ect treatments last treatment Sep 02 1011   Depression    Depression    Phreesia 02/08/2020   Depression    Phreesia 07/10/2020   GERD (gastroesophageal reflux disease)    Hypertension    Pre-diabetes    Seizures ([place])    last seizure was so long ago she can't remember.    Sleep apnea    wears CPAP, uncertain of setting   Substance abuse ([place])    Phreesia 02/08/2020    Patient Active Problem List   Diagnosis Date Noted   Open  wound of right foot, initial encounter 10/21/2020   Finger ulcer ([place]) 10/21/2020   Syncope 08/26/2019   Non-healing open wound of toe 05/15/2019   Osteomyelitis ([place]) 05/15/2019   Palpitations 10/26/2018   Foot ulcer ([place]) 01/31/2018   Hyperglycemia 01/31/2018   Left leg cellulitis 09/24/2017   Syncope and collapse    Near syncope 11/23/2016   Abdominal pain 09/23/2016   Anxiety 09/23/2016   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[place]) 09/23/2016   Constipation 09/23/2016   DDD (degenerative disc disease), lumbosacral 09/23/2016   Migraines 09/23/2016   Myelopathy ([place]) 09/23/2016   Nausea 09/23/2016   Post laminectomy syndrome 09/23/2016   Pseudoarthrosis of lumbar spine 09/23/2016   Seizures ([place]) 09/23/2016   Shortness of breath 09/23/2016   Sleep apnea 09/23/2016   Spinal stenosis of lumbar region 09/23/2016   Vertigo 09/23/2016   Hallux malleus 11/15/2015   Chronic pain syndrome    GERD (gastroesophageal reflux disease)    Depression    Rash 11/14/2015   Toe ulcer, right ([place]) 04/11/2015   Cellulitis of toe of right foot 04/11/2015   Nonspecific chest pain    Chest pain 10/26/2014   Obesity (BMI 30-39.9) 10/26/2014   Hypokalemia 10/26/2014   Manic bipolar I  disorder ([place]) 10/26/2014   Atypical angina ([place]) 10/26/2014   Tardive dyskinesia 08/26/2011   Manic bipolar I disorder with rapid cycling ([place]) 05/18/2011    Class: Acute   Asthma 02/24/2011   Essential hypertension 02/24/2011   History of migraine headaches 02/24/2011    Past Surgical History:  Procedure Laterality Date   ABDOMINAL HYSTERECTOMY     AMPUTATION TOE Left 02/02/2018   Procedure: AMPUTATION TOE Left 4th toe;  Surgeon: [name], DPM;  Location: [facility];  Service: Podiatry;  Laterality: Left;   APPENDECTOMY N/A    Phreesia 02/08/2020   BACK SURGERY     CARPAL TUNNEL RELEASE     x2   IRRIGATION AND DEBRIDEMENT ABSCESS Right 10/25/2020   Procedure: IRRIGATION AND DEBRIDEMENT ABSCESS OF  FOOT AND APPLICATION OF GRAFT;  Surgeon: [name], DPM;  Location: [facility];  Service: Podiatry;  Laterality: Right;   Laproscopic knee surgery     NECK SURGERY  03/15/2017   PLANTAR FASCIA RELEASE     x2   SPINE SURGERY N/A    Phreesia 02/08/2020   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   Procedure: TRANSMETATARSAL AMPUTATION;  Surgeon: [name], DPM;  Location: [facility];  Service: Podiatry;  Laterality: Right;     OB History     Gravida  3   Para  3   Term  3   Preterm      AB      Living  3      SAB      IAB      Ectopic      Multiple      Live Births  3           Family History  Problem Relation Age of Onset   Diabetes Mother    COPD Mother    Hypertension Mother    Hyperlipidemia Mother    Heart disease Father    Hyperlipidemia Father    Hypertension Father    Cancer Father    Heart disease Brother    Heart disease Daughter    Heart disease Maternal Grandmother    Hypertension Maternal Grandmother    Heart disease Maternal Grandfather    Kidney cancer Paternal Grandmother    Heart disease Paternal Grandfather     Social History   Tobacco Use   Smoking status: Never   Smokeless tobacco: Never  Vaping Use   Vaping Use: Never used  Substance Use Topics   Alcohol use: No   Drug use: No    Home Medications Prior to Admission medications   Medication Sig Start Date End Date Taking? Authorizing Provider  AIMOVIG 70 MG/ML SOAJ ADMINISTER 1 ML UNDER THE SKIN EVERY 30 DAYS Patient taking differently: Inject 1 mL into the skin every 30 (thirty) days. 11/10/20   [name], MD  albuterol (PROAIR HFA) [phone] Base) MCG/ACT inhaler Inhale 2 puffs = 15mcg into the lungs every 6 (six) hours as needed for wheezing or shortness of breath. 07/11/20   Eulogio Bear, NP  busPIRone (BUSPAR) 15 MG tablet Take 15 mg by mouth 3 (three) times daily.  04/26/17   [provider]  Calcium Carbonate-Vit D-Min (CALCIUM 1200  PO) Take 1,200 mg by mouth daily.     [provider]  cephALEXin (KEFLEX) 500 MG capsule Take 1 capsule (500 mg total) by mouth 3 (three) times daily. 04/09/21   [name], DPM  cyclobenzaprine (FLEXERIL) 10 MG tablet Take  10 mg by mouth 2 (two) times daily as needed for muscle spasms.     [provider]  DULoxetine (CYMBALTA) 60 MG capsule TAKE 1 CAPSULE BY MOUTH EVERY DAY 11/07/20   [name], MD  escitalopram (LEXAPRO) 10 MG tablet Take 10 mg by mouth daily. 08/23/19   [provider]  estradiol (ESTRACE) 1 MG tablet TAKE 1 TABLET AT BEDTIME 08/22/20   [name], MD  fluticasone (FLONASE) 50 MCG/ACT nasal spray SPRAY 2 SPRAYS INTRO EACH NOSTRIL EVERY DAY Patient taking differently: Place 2 sprays into both nostrils daily. 12/18/20   [name], MD  haloperidol (HALDOL) 5 MG tablet Take 1 tablet (5 mg total) by mouth at bedtime. For psychosis 11/15/15   Barton Dubois, MD  levocetirizine (XYZAL) 5 MG tablet TAKE 1 TABLET BY MOUTH EVERY DAY IN THE EVENING 11/17/20   [name], MD  LINZESS 290 MCG CAPS capsule TAKE 1 CAPSULE BY MOUTH EVERY MORNING 11/20/20   [name], MD  meloxicam (MOBIC) 15 MG tablet Take 15 mg by mouth at bedtime.  09/15/17   [provider]  metoprolol succinate (TOPROL-XL) 25 MG 24 hr tablet TAKE 1 TABLET BY MOUTH DAILY 02/23/21   [name], MD  Multiple Vitamins-Minerals (MULTIVITAMIN WITH MINERALS) tablet Take 1 tablet by mouth daily.    [provider]  OVER THE COUNTER MEDICATION Fennel Seed    [provider]  oxyCODONE-acetaminophen (PERCOCET) 7.5-325 MG tablet Take 1 tablet by mouth 5 (five) times daily as needed for severe pain.    [provider]  pantoprazole (PROTONIX) 40 MG tablet TAKE 1 TABLET BY MOUTH TWICE DAILY 12/30/20   [name], MD  potassium chloride SA (KLOR-CON) 20 MEQ tablet TAKE 2 TABLETS BY MOUTH EVERY DAY 12/08/20   [name], MD   prazosin (MINIPRESS) 2 MG capsule Take 2 mg by mouth at bedtime.    [provider]  pregabalin (LYRICA) 100 MG capsule Take 100 mg by mouth 2 (two) times daily. 06/26/20   [provider]  promethazine (PHENERGAN) 25 MG tablet Take 1 tablet (25 mg total) by mouth every 8 (eight) hours as needed for nausea or vomiting. 03/18/21   [name], DPM  solifenacin (VESICARE) 10 MG tablet Take 1 tablet (10 mg total) by mouth daily. 11/17/20   [name], MD  SUMAtriptan (IMITREX) 50 MG tablet TAKE 1 TAB EVERY 2 HOURS AS NEEDED FOR MIGRAINE. MAY REPEAT IN 2 HOURS IF PERSISTS OR RECURS Patient taking differently: Take 50 mg by mouth every 2 (two) hours as needed for migraine. 08/30/19   [name], MD  topiramate (TOPAMAX) 50 MG tablet TAKE 1 TABLET BY MOUTH TWICE DAILY 12/19/20   [name], MD  traZODone (DESYREL) 100 MG tablet Take 100 mg by mouth at bedtime. 07/29/14   [provider]    Allergies    Other, Penicillins, Tetracyclines & related, Tramadol, Phenazopyridine, Tetracycline, Ciprofloxacin, Codeine, and Estradiol  Review of Systems   Review of Systems 10 systems reviewed and negative except as per HPI Physical Exam Updated Vital Signs BP 123/69 (BP Location: Left Wrist)    Pulse 79    Temp 98.9 F (37.2 C) (Oral)    Resp 16    SpO2 95%   Physical Exam Constitutional:      Comments: Alert.  Clear mental status.  Obesity with physical deconditioning  HENT:     Head: Normocephalic and  atraumatic.  Eyes:     Extraocular Movements: Extraocular movements intact.  Cardiovascular:     Rate and Rhythm: Normal rate and regular rhythm.  Pulmonary:     Effort: Pulmonary effort is normal.     Breath sounds: Normal breath sounds.  Musculoskeletal:     Comments: Patient currently has a sheet sling stabilizing the right shoulder.  Hand is warm and dry with good perfusion.  Radial pulse 2+.  Patient has intact grip strength.  Pain with minimal  movement of the shoulder.  Will wait for pain medication and sling to completely assess musculoskeletal exam of shoulder.  Patient has a walking boot on her right lower extremity.  Sling removed, shoulder and upper arm examined.  No visible swellings or deformities.  Patient endorses tenderness over the point of the shoulder and at the [facility] joint as well as slightly distal.  No joint effusions or signs of neurovascular compromise.  Limited range of motion of the shoulder due to pain  Skin:    General: Skin is warm and dry.  Neurological:     General: No focal deficit present.     Mental Status: She is oriented to person, place, and time.    ED Results / Procedures / Treatments   Labs (all labs ordered are listed, but only abnormal results are displayed) Labs Reviewed - No data to display  EKG None  Radiology DG Shoulder Right  Result Date: 04/17/2021 CLINICAL DATA:  Right shoulder pain, injury EXAM: RIGHT SHOULDER - 2+ VIEW; RIGHT HUMERUS - 2+ VIEW COMPARISON:  01/06/2021 FINDINGS: Right shoulder: Internal rotation, external rotation, transscapular views are obtained. Stable mild osteoarthritis of the acromioclavicular and glenohumeral joints. On the transscapular view, there is subtle cortical irregularity along the posterior aspect of the proximal humeral diaphysis. Small avulsion type fracture cannot be excluded, and further evaluation with CT may be useful. No other acute bony abnormalities. Alignment is anatomic. Visualized portions of the right chest are clear. Stable postsurgical changes of the thoracic spine. Right humerus: Frontal and lateral views are obtained. There are no acute fractures. Alignment is anatomic. Soft tissues are normal. IMPRESSION: 1. Subtle cortical irregularity seen posteriorly along the proximal humeral diaphysis on the transscapular view, which could reflect a small avulsion fracture. This is not seen on any of the other images. Further evaluation with CT scan  could be considered. 2. Stable right shoulder osteoarthritis. Electronically Signed   By: Randa Ngo M.D.   On: 04/17/2021 22:31   CT Shoulder Right Wo Contrast  Result Date: 04/18/2021 CLINICAL DATA:  [place].  Right shoulder pain. EXAM: CT OF THE UPPER RIGHT EXTREMITY WITHOUT CONTRAST TECHNIQUE: Multidetector CT imaging of the upper right extremity was performed according to the standard protocol. COMPARISON:  Radiographs 04/17/2021 FINDINGS: The glenohumeral and AC joints are intact. No acute fractures of the right shoulder are identified. No humeral, scapular or clavicle fracture. The visualized right ribs are intact the visualized right lung is grossly clear. Grossly by CT the rotator cuff tendons are intact. IMPRESSION: No acute fractures or obvious rotator cuff tear. Electronically Signed   By: Marijo Sanes M.D.   On: 04/18/2021 14:27   DG Humerus Right  Result Date: 04/17/2021 CLINICAL DATA:  Right shoulder pain, injury EXAM: RIGHT SHOULDER - 2+ VIEW; RIGHT HUMERUS - 2+ VIEW COMPARISON:  01/06/2021 FINDINGS: Right shoulder: Internal rotation, external rotation, transscapular views are obtained. Stable mild osteoarthritis of the acromioclavicular and glenohumeral joints. On the transscapular view, there is subtle  cortical irregularity along the posterior aspect of the proximal humeral diaphysis. Small avulsion type fracture cannot be excluded, and further evaluation with CT may be useful. No other acute bony abnormalities. Alignment is anatomic. Visualized portions of the right chest are clear. Stable postsurgical changes of the thoracic spine. Right humerus: Frontal and lateral views are obtained. There are no acute fractures. Alignment is anatomic. Soft tissues are normal. IMPRESSION: 1. Subtle cortical irregularity seen posteriorly along the proximal humeral diaphysis on the transscapular view, which could reflect a small avulsion fracture. This is not seen on any of the other images. Further  evaluation with CT scan could be considered. 2. Stable right shoulder osteoarthritis. Electronically Signed   By: Randa Ngo M.D.   On: 04/17/2021 22:31    Procedures Procedures   Medications Ordered in ED Medications  ibuprofen (ADVIL) tablet 600 mg (600 mg Oral Patient Refused/Not Given 04/18/21 0040)  HYDROmorphone (DILAUDID) injection 1 mg (1 mg Intravenous Given 04/18/21 0901)    ED Course  I have reviewed the triage vital signs and the nursing notes.  Pertinent labs & imaging results that were available during my care of the patient were reviewed by me and considered in my medical decision making (see chart for details).    MDM Rules/Calculators/A&P                         Patient presents with acute shoulder injury.  She describes trying to elevate her weight by pushing downward on armrests of a chair.  I suspect internal shoulder derangement versus larger muscle body avulsion with x-ray findings of possible avulsion fracture.  Patient is neurovascularly intact.  She has severe pain with any attempts at range of motion.  Will administer Dilaudid 1 mg IV for pain control and proceed with CT scan of the shoulder as recommended by [facility] film imaging radiology interpretation.  CT imaging does not identify any occult fracture.  Patient is neurovascularly intact.  She has severely limited range of motion of the shoulder due to pain.  No obvious soft tissue anomaly on CT.  At this time most suspect internal shoulder derangement such as rotator cuff tear or possibly partial tendon rupture.  Patient is stable for continued outpatient orthopedic follow-up.  Patient does have chronically prescribed home pain medications.  Recommendations for home management provided.  Patient placed in sling for comfort with instructions for initiating small range of motion exercises to avoid frozen shoulder.   Final Clinical Impression(s) / ED Diagnoses Final diagnoses:  Internal derangement of right  shoulder    Rx / DC Orders ED Discharge Orders     None        Charlesetta Shanks, MD 04/22/21 [phone]

## 2021-04-21 DIAGNOSIS — M25511 Pain in right shoulder: Secondary | ICD-10-CM | POA: Diagnosis not present

## 2021-04-27 ENCOUNTER — Ambulatory Visit (INDEPENDENT_AMBULATORY_CARE_PROVIDER_SITE_OTHER): Payer: Medicare Other | Admitting: Podiatry

## 2021-04-27 ENCOUNTER — Other Ambulatory Visit: Payer: Self-pay

## 2021-04-27 DIAGNOSIS — L97511 Non-pressure chronic ulcer of other part of right foot limited to breakdown of skin: Secondary | ICD-10-CM

## 2021-04-27 DIAGNOSIS — M79672 Pain in left foot: Secondary | ICD-10-CM

## 2021-04-27 DIAGNOSIS — Z9889 Other specified postprocedural states: Secondary | ICD-10-CM

## 2021-04-27 MED ORDER — CEPHALEXIN 500 MG PO CAPS: 500.0000 mg | ORAL_CAPSULE | Freq: Three times a day (TID) | ORAL | 0 refills | Status: DC

## 2021-04-28 DIAGNOSIS — M869 Osteomyelitis, unspecified: Secondary | ICD-10-CM | POA: Diagnosis not present

## 2021-04-28 DIAGNOSIS — G4733 Obstructive sleep apnea (adult) (pediatric): Secondary | ICD-10-CM | POA: Diagnosis not present

## 2021-04-28 DIAGNOSIS — R531 Weakness: Secondary | ICD-10-CM | POA: Diagnosis not present

## 2021-05-01 ENCOUNTER — Other Ambulatory Visit: Payer: Self-pay

## 2021-05-01 MED ORDER — POTASSIUM CHLORIDE CRYS ER 20 MEQ PO TBCR: EXTENDED_RELEASE_TABLET | ORAL | 1 refills | Status: DC

## 2021-05-03 NOTE — Progress Notes (Signed)
Subjective: [name] [name] is a 56 y.o. is seen today in office s/p right foot exostectomy preformed on 03/18/2021.  She presents today for wound check.  She states that it is doing better and almost healed.  Some minimal clear drainage but there is no pus.  Mild surrounding redness but no red streaks.  She has no pain although she does have significant neuropathy.  She is walking in the cam boot.  She has no other concerns today.  Denies any fevers or chills.  Objective: General: No acute distress, AAOx3  DP/PT pulses palpable 2/4, CRT < 3 sec to all digits.  Right foot: On the of the incision there is a superficial area of skin breakdown approximately 1 x 0.1 cm.  Mild surrounding erythema without any purulence.  No fluctuation or crepitation.  There is no malodor.  No ascending cellulitis.  The wound that was present initially has healed No other open lesions or pre-ulcerative lesions.  No pain with calf compression, swelling, warmth, erythema.   Assessment and Plan:  Status post right foot surgery  -Treatment options discussed including all alternatives, risks, and complications -There is 1 superficial opening of the wound still evident.  Discussed washing with soap and water and applying dressings and changing it daily.  Given mild erythema continue antibiotics and refill cephalexin.  There is no drainage to culture -Monitor for any clinical signs or symptoms of infection and directed to call the office immediately should any occur or go to the ER.  Return in about 2 weeks (around 05/11/2021).  [name] DPM

## 2021-05-04 DIAGNOSIS — G894 Chronic pain syndrome: Secondary | ICD-10-CM | POA: Diagnosis not present

## 2021-05-04 DIAGNOSIS — M961 Postlaminectomy syndrome, not elsewhere classified: Secondary | ICD-10-CM | POA: Diagnosis not present

## 2021-05-04 DIAGNOSIS — M47816 Spondylosis without myelopathy or radiculopathy, lumbar region: Secondary | ICD-10-CM | POA: Diagnosis not present

## 2021-05-04 DIAGNOSIS — M25519 Pain in unspecified shoulder: Secondary | ICD-10-CM | POA: Diagnosis not present

## 2021-05-04 NOTE — Telephone Encounter (Signed)
Error message

## 2021-05-06 DIAGNOSIS — M4326 Fusion of spine, lumbar region: Secondary | ICD-10-CM | POA: Diagnosis not present

## 2021-05-06 DIAGNOSIS — R69 Illness, unspecified: Secondary | ICD-10-CM | POA: Diagnosis not present

## 2021-05-06 DIAGNOSIS — M7062 Trochanteric bursitis, left hip: Secondary | ICD-10-CM | POA: Diagnosis not present

## 2021-05-06 DIAGNOSIS — M419 Scoliosis, unspecified: Secondary | ICD-10-CM | POA: Diagnosis not present

## 2021-05-12 ENCOUNTER — Encounter: Payer: Self-pay | Admitting: Family Medicine

## 2021-05-12 ENCOUNTER — Ambulatory Visit (INDEPENDENT_AMBULATORY_CARE_PROVIDER_SITE_OTHER): Payer: Medicare Other | Admitting: Family Medicine

## 2021-05-12 ENCOUNTER — Other Ambulatory Visit: Payer: Self-pay

## 2021-05-12 VITALS — BP 124/78 | HR 70 | Temp 97.0°F | Resp 18 | Ht 64.0 in | Wt 235.0 lb

## 2021-05-12 DIAGNOSIS — Z23 Encounter for immunization: Secondary | ICD-10-CM | POA: Diagnosis not present

## 2021-05-12 DIAGNOSIS — K219 Gastro-esophageal reflux disease without esophagitis: Secondary | ICD-10-CM

## 2021-05-12 DIAGNOSIS — M25511 Pain in right shoulder: Secondary | ICD-10-CM | POA: Diagnosis not present

## 2021-05-12 DIAGNOSIS — K921 Melena: Secondary | ICD-10-CM

## 2021-05-12 DIAGNOSIS — R69 Illness, unspecified: Secondary | ICD-10-CM | POA: Diagnosis not present

## 2021-05-12 MED ORDER — FAMOTIDINE 40 MG PO TABS: 40.0000 mg | ORAL_TABLET | Freq: Every day | ORAL | 1 refills | Status: DC

## 2021-05-12 NOTE — Progress Notes (Signed)
Subjective:    Patient ID: [name] [name], female    DOB: [birth year], 56 y.o.   MRN: [id number]  HPI Patient has 2 issues.  First she states that for the last month or so she has been having acid come up into her chest at night.  Occasionally she coughs and feels like she is aspirating into her lungs.  This only occurs at night while she is sleeping.  Occasionally will wake her up.  She is taking pantoprazole 40 mg twice daily.  She is also on meloxicam which she takes for arthritis in her knees.  She denies any melena.  She denies any abdominal pain.  She has had 1 episode of bright red blood per rectum.  She saw painless rectal bleeding on January 20 after a bowel movement.  She denies any perirectal mass or external hemorrhoid.  She also declines to allow me to do a rectal exam.  She states that her last colonoscopy was about 10 years ago and she would like to see her gastroenterologist for another colonoscopy Past Medical History:  Diagnosis Date   Anxiety    Arthritis    [employer] 02/08/2020   Asthma    mild intermittent   Asthma    [employer] 02/08/2020   Bipolar 1 disorder ([place])    ect treatments last treatment Sep 02 1011   Depression    Depression    [employer] 02/08/2020   Depression    [employer] 07/10/2020   GERD (gastroesophageal reflux disease)    Hypertension    Pre-diabetes    Seizures ([place])    last seizure was so long ago she can't remember.    Sleep apnea    wears CPAP, uncertain of setting   Substance abuse ([place])    [employer] 02/08/2020   Past Surgical History:  Procedure Laterality Date   ABDOMINAL HYSTERECTOMY     AMPUTATION TOE Left 02/02/2018   Procedure: AMPUTATION TOE Left 4th toe;  Surgeon: [name], DPM;  Location: [facility];  Service: Podiatry;  Laterality: Left;   APPENDECTOMY N/A    [employer] 02/08/2020   BACK SURGERY     CARPAL TUNNEL RELEASE     x2   IRRIGATION AND DEBRIDEMENT ABSCESS Right 10/25/2020   Procedure: IRRIGATION AND DEBRIDEMENT  ABSCESS OF FOOT AND APPLICATION OF GRAFT;  Surgeon: [name], DPM;  Location: [facility];  Service: Podiatry;  Laterality: Right;   Laproscopic knee surgery     NECK SURGERY  03/15/2017   PLANTAR FASCIA RELEASE     x2   SPINE SURGERY N/A    [employer] 02/08/2020   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 8.5 g 3   busPIRone (BUSPAR) 15 MG tablet Take 15 mg by mouth 3 (three) times daily.   2   Calcium Carbonate-Vit D-Min (CALCIUM 1200 PO) Take 1,200 mg by mouth daily.      cyclobenzaprine (FLEXERIL) 10 MG tablet Take 10 mg  by mouth 2 (two) times daily as needed for muscle spasms.      DULoxetine (CYMBALTA) 60 MG capsule TAKE 1 CAPSULE BY MOUTH EVERY DAY 90 capsule 2   escitalopram (LEXAPRO) 10 MG tablet Take 10 mg by mouth daily.     estradiol (ESTRACE) 1 MG tablet TAKE 1 TABLET AT BEDTIME 90 tablet 3   fluticasone (FLONASE) 50 MCG/ACT nasal spray SPRAY 2 SPRAYS INTRO EACH NOSTRIL EVERY DAY (Patient taking differently: Place 2 sprays into both nostrils daily.) 48 g 1   haloperidol (HALDOL) 5 MG tablet Take 1 tablet (5 mg total) by mouth at bedtime. For psychosis     levocetirizine (XYZAL) 5 MG tablet TAKE 1 TABLET BY MOUTH EVERY DAY IN THE EVENING 90 tablet 1   LINZESS 290 MCG CAPS capsule TAKE 1 CAPSULE BY MOUTH EVERY MORNING 90 capsule 3   meloxicam (MOBIC) 15 MG tablet Take 15 mg by mouth at bedtime.   2   metoprolol succinate (TOPROL-XL) 25 MG 24 hr  tablet TAKE 1 TABLET BY MOUTH DAILY 90 tablet 3   Multiple Vitamins-Minerals (MULTIVITAMIN WITH MINERALS) tablet Take 1 tablet by mouth daily.     OVER THE COUNTER MEDICATION Fennel Seed     oxyCODONE-acetaminophen (PERCOCET) 7.5-325 MG tablet Take 1 tablet by mouth 5 (five) times daily as needed for severe pain.     pantoprazole (PROTONIX) 40 MG tablet TAKE 1 TABLET BY MOUTH TWICE DAILY 180 tablet 2   potassium chloride SA (KLOR-CON M) 20 MEQ tablet TAKE 2 TABLETS BY MOUTH EVERY DAY 180 tablet 1   prazosin (MINIPRESS) 2 MG capsule Take 2 mg by mouth at bedtime.     pregabalin (LYRICA) 100 MG capsule Take 100 mg by mouth 2 (two) times daily.     promethazine (PHENERGAN) 25 MG tablet Take 1 tablet (25 mg total) by mouth every 8 (eight) hours as needed for nausea or vomiting. 20 tablet 0   solifenacin (VESICARE) 10 MG tablet Take 1 tablet (10 mg total) by mouth daily. 90 tablet 3   SUMAtriptan (IMITREX) 50 MG tablet TAKE 1 TAB EVERY 2 HOURS AS NEEDED FOR MIGRAINE. MAY REPEAT IN 2 HOURS IF PERSISTS OR RECURS (Patient taking differently: Take 50 mg by mouth every 2 (two) hours as needed for migraine.) 10 tablet 3   topiramate (TOPAMAX) 50 MG tablet TAKE 1 TABLET BY MOUTH TWICE DAILY 180 tablet 2   traZODone (DESYREL) 100 MG tablet Take 100 mg by mouth at bedtime.  0   No current facility-administered medications on file prior to visit.   Allergies  Allergen Reactions   Other    Penicillins Hives and Rash    No anaphylactic or severe cutaneous Sx > 10 yr ago, no additional medical attention required Tolerates Keflex and Rocephin   Tetracyclines & Related Other (See Comments)    Syncope and put her "in a coma"   Tramadol Other (See Comments)    Seizures   Phenazopyridine Other (See Comments)    Unknown   Tetracycline Other (See Comments)    Syncope and "Put me in a coma"   Ciprofloxacin Rash and Itching   Codeine Itching and Rash   Estradiol Rash    Patches broke out the skin   Social  History   Socioeconomic History   Marital status: Widowed    Spouse name: Not on file   Number of children: 3   Years of education: 17   Highest education level: Not on file  Occupational History   Occupation: Disabled  Tobacco Use   Smoking status: Never   Smokeless tobacco: Never  Vaping Use   Vaping Use: Never used  Substance and Sexual Activity   Alcohol use: No   Drug use: No   Sexual activity: Not on file  Other Topics Concern   Not on file  Social History Narrative   Patient drink 4 16oz caffeine drinks a day    Social Determinants of Health   Financial Resource Strain: Low Risk    Difficulty of Paying Living Expenses: Not hard at all  Food Insecurity: No Food Insecurity   Worried About Charity fundraiser in the Last Year: Never true   Ran Out of Food in the Last Year: Never true  Transportation Needs: No Transportation Needs   Lack of Transportation (Medical): No   Lack of Transportation (Non-Medical): No  Physical Activity: Inactive   Days of Exercise per Week: 0 days   Minutes of Exercise per Session: 0 min  Stress: No Stress Concern Present   Feeling of Stress : Not at all  Social Connections: Socially Isolated   Frequency of Communication with Friends and Family: More than three times a week   Frequency of Social Gatherings with Friends and Family: Twice a week   Attends Religious Services: Never   Marine scientist or Organizations: No   Attends Archivist Meetings: Never   Marital Status: Widowed  Human resources officer Violence: Not At Risk   Fear of Current or Ex-Partner: No   Emotionally Abused: No   Physically Abused: No   Sexually Abused: No   Family History  Problem Relation Age of Onset   Diabetes Mother    COPD Mother    Hypertension Mother    Hyperlipidemia Mother    Heart disease Father    Hyperlipidemia Father    Hypertension Father    Cancer Father    Heart disease Brother    Heart disease Daughter    Heart disease  Maternal Grandmother    Hypertension Maternal Grandmother    Heart disease Maternal Grandfather    Kidney cancer Paternal Grandmother    Heart disease Paternal Grandfather       Review of Systems  All other systems reviewed and are negative.     Objective:   Physical Exam Vitals reviewed.  Constitutional:      General: She is not in acute distress.    Appearance: She is well-developed. She is not diaphoretic.  HENT:     Head: Normocephalic and atraumatic.     Right Ear: External ear normal.     Left Ear: External ear normal.     Nose: Nose normal.     Mouth/Throat:     Pharynx: No oropharyngeal exudate.  Eyes:     General: No scleral icterus.       Right eye: No discharge.        Left eye: No discharge.     Conjunctiva/sclera: Conjunctivae normal.     Pupils: Pupils are equal, round, and reactive to light.  Neck:     Thyroid: No thyromegaly.     Vascular: No JVD.     Trachea: No tracheal deviation.  Cardiovascular:     Rate and Rhythm: Normal rate and regular rhythm.     Heart sounds: Normal heart sounds. No murmur heard.   No friction rub. No gallop.  Pulmonary:     Effort: Pulmonary effort is normal. No respiratory distress.  Breath sounds: Normal breath sounds. No stridor. No wheezing or rales.  Chest:     Chest wall: No tenderness.  Abdominal:     General: Bowel sounds are normal. There is no distension.     Palpations: Abdomen is soft. There is no mass.     Tenderness: There is no abdominal tenderness. There is no guarding or rebound.  Musculoskeletal:        General: No tenderness. Normal range of motion.     Cervical back: Normal range of motion and neck supple.  Lymphadenopathy:     Cervical: No cervical adenopathy.  Skin:    General: Skin is warm.     Coloration: Skin is not pale.     Findings: No erythema or rash.  Neurological:     Mental Status: She is alert and oriented to person, place, and time.     Cranial Nerves: No cranial nerve  deficit.     Motor: No abnormal muscle tone.     Coordination: Coordination normal.     Deep Tendon Reflexes: Reflexes are normal and symmetric.  Psychiatric:        Behavior: Behavior normal.        Thought Content: Thought content normal.        Judgment: Judgment normal.          Assessment & Plan:  Blood in stool, frank - Plan: Ambulatory referral to Gastroenterology  Gastroesophageal reflux disease, unspecified whether esophagitis present Patient has a history of sleep apnea and question of some of that acid reflux could be due to the air pressure from the CPAP machine.  I recommended adding famotidine 40 mg p.o. nightly to the pantoprazole that she is taking.  If this does not help we can try stopping the meloxicam.  I also recommended elevating the head of the bed to 30 degrees.  I will consult GI regarding the blood in her stool.  I explained to the patient is difficult to tell without performing a rectal exam.  It could be due to internal hemorrhoids or diverticulosis however if its been 10 years since her last colonoscopy, I believe that a colonoscopy is a good idea and I will help schedule this.

## 2021-05-13 ENCOUNTER — Other Ambulatory Visit: Payer: Self-pay

## 2021-05-13 MED ORDER — LEVOCETIRIZINE DIHYDROCHLORIDE 5 MG PO TABS: 5.0000 mg | ORAL_TABLET | Freq: Every evening | ORAL | 3 refills | Status: DC

## 2021-05-14 ENCOUNTER — Ambulatory Visit (INDEPENDENT_AMBULATORY_CARE_PROVIDER_SITE_OTHER): Payer: Medicare Other | Admitting: Podiatry

## 2021-05-14 ENCOUNTER — Other Ambulatory Visit: Payer: Self-pay

## 2021-05-14 DIAGNOSIS — Z9889 Other specified postprocedural states: Secondary | ICD-10-CM

## 2021-05-14 DIAGNOSIS — L97511 Non-pressure chronic ulcer of other part of right foot limited to breakdown of skin: Secondary | ICD-10-CM

## 2021-05-14 DIAGNOSIS — R69 Illness, unspecified: Secondary | ICD-10-CM | POA: Diagnosis not present

## 2021-05-14 NOTE — Progress Notes (Signed)
Subjective: [name] [name] is a 55 y.o. is seen today in office s/p right foot exostectomy preformed on 03/18/2021.  She presents today for wound check.  States that she is doing much better.  She is doing the cam boot.  She denies any swelling or redness or any drainage.  Denies any fevers or chills.  She has no other concerns today.  Objective: General: No acute distress, AAOx3  DP/PT pulses palpable 2/4, CRT < 3 sec to all digits.  Right foot: At this point incision is well-healed there is no openings in the incision.  There is no surrounding erythema, ascending cellulitis but no drainage or pus.  Fluctuation or crepitation but there is no malodor.  The area that was present previously with a wound is with a small scab.  Upon debridement area is preulcerative there is no definitive skin breakdown.  No fluctuation, crepitation.  No erythema or any signs of infection otherwise. No other open lesions or pre-ulcerative lesions.  No pain with calf compression, swelling, warmth, erythema.   Assessment and Plan:  Status post right foot surgery  -Treatment options discussed including all alternatives, risks, and complications -The incision is healed.  The wound that was present prior to surgery is also doing much better.  At this point discussed transition back to her diabetic shoe, offloading.  He is to monitor very closely for any further skin breakdown or reoccurrence.  Monitor for any signs or symptoms of infection.  [name] DPM

## 2021-05-18 DIAGNOSIS — R69 Illness, unspecified: Secondary | ICD-10-CM | POA: Diagnosis not present

## 2021-05-18 DIAGNOSIS — M25511 Pain in right shoulder: Secondary | ICD-10-CM | POA: Diagnosis not present

## 2021-05-19 ENCOUNTER — Telehealth: Payer: Self-pay | Admitting: *Deleted

## 2021-05-19 ENCOUNTER — Telehealth: Payer: Self-pay | Admitting: Family Medicine

## 2021-05-19 NOTE — Chronic Care Management (AMB) (Signed)
°  Chronic Care Management   Outreach Note  05/19/2021 Name: [name] [name] MRN: [id number] DOB: [birth year]  Referred by: [name], MD Reason for referral : No chief complaint on file.   An unsuccessful telephone outreach was attempted today. The patient was referred to the pharmacist for assistance with care management and care coordination.   Follow Up Plan:   Tatjana Dellinger Upstream Scheduler

## 2021-05-19 NOTE — Telephone Encounter (Signed)
Patient is calling to request met.cushions(same ones used in office) for her foot,wants them ordered from [facility], so her insurance will cover. Please advise.

## 2021-05-21 ENCOUNTER — Telehealth: Payer: Self-pay | Admitting: Family Medicine

## 2021-05-21 DIAGNOSIS — M25511 Pain in right shoulder: Secondary | ICD-10-CM | POA: Diagnosis not present

## 2021-05-21 NOTE — Progress Notes (Signed)
Chronic Care Management Pharmacy Note  05/29/2021 Name:  [name] [name] MRN:  [id number] DOB:  1965/06/23  Summary: Initial visit with PharmD.  All meds reviewed not concerns at this time patient reports she is doing well.  Would like medications synchronized and delivered through [facility]  Recommendations/Changes made from today's visit: [facility] synchronization plan  Plan: FU 4 months   Subjective: [name] [name] is an 56 y.o. year old female who is a primary patient of Pickard, Cammie Mcgee, MD.  The CCM team was consulted for assistance with disease management and care coordination needs.    Engaged with patient by telephone for initial visit in response to provider referral for pharmacy case management and/or care coordination services.   Consent to Services:  The patient was given the following information about Chronic Care Management services today, agreed to services, and gave verbal consent: 1. CCM service includes personalized support from designated clinical staff supervised by the primary care provider, including individualized plan of care and coordination with other care providers 2. 24/7 contact phone numbers for assistance for urgent and routine care needs. 3. Service will only be billed when office clinical staff spend 20 minutes or more in a month to coordinate care. 4. Only one practitioner may furnish and bill the service in a calendar month. 5.The patient may stop CCM services at any time (effective at the end of the month) by phone call to the office staff. 6. The patient will be responsible for cost sharing (co-pay) of up to 20% of the service fee (after annual deductible is met). Patient agreed to services and consent obtained.  Patient Care Team: [name], MD as PCP - General (Family Medicine) [name], MD as PCP - Cardiology (Cardiology) Edythe Clarity, [facility] as Pharmacist (Pharmacist)  Recent office visits:  05/12/2021 OV (PCP) [name], MD;  I recommended adding famotidine 40 mg p.o. nightly to the pantoprazole that she is taking.  If this does not help we can try stopping the meloxicam.    Recent consult visits:  05/14/2021 (Podiatry) [name], DPM; no medication changes indicated.   04/27/2021 (Podiatry) [name], DPM; 05/14/2021 (Podiatry) [name], DPM;    04/09/2021 (Podiatry) [name], DPM; no medication changes indicated.   04/02/2021 (Podiatry) [name], DPM;  Due to the mild erythema I think is more from the sutures from soap and restart cephalexin.   03/24/2021 (Podiatry) [name], DPM; no medication changes indicated.   03/20/2021 (Podiatry) [name], DPM; no medication changes indicated.   03/06/2021 (Podiatry) [name], DPM; no medication changes indicated.   02/23/2021 (Podiatry) [name], DPM; no medication changes indicated.   01/28/2021 (Cardiology) [name], MD; no medication changes indicated.   01/22/2021 (Podiatry) [name], DPM; no medication changes indicated.   01/08/2021 (Podiatry) [name], DPM; no medication changes indicated.   12/30/2020 (Podiatry) [name], DPM; no medication changes indicated.   12/23/2020 (Podiatry) [name], DPM; no medication changes indicated.   12/18/2020 (Podiatry) [name], DPM; no medication changes indicated.   12/11/2020 (Podiatry) [name], DPM; no medication changes indicated.   12/02/2020 (Podiatry) [name], DPM; no medication changes indicated.   11/27/2020 (Podiatry) [name], DPM; no medication changes indicated.   11/24/2020 (Podiatry) [name], DPM; no medication changes indicated.   Hospital visits:  04/17/2021 ED visit for Internal derangement  of right shoulder -No medication changes indicated   02/03/2021 Admission for elective surgery -  Revision of Lumbar Fusion w/hardware removal   01/06/2021 ED visit for Fall -No medication changes indicated.     Objective:  Lab Results  Component Value Date   CREATININE 0.61 11/17/2020   BUN 10 11/17/2020   EGFR 104 11/17/2020   GFRNONAA >60 10/24/2020   GFRAA 119 06/27/2020   NA 138 11/17/2020   K 4.1 11/17/2020   CALCIUM 8.8 11/17/2020   CO2 27 11/17/2020   GLUCOSE 114 (H) 11/17/2020    Lab Results  Component Value Date/Time   HGBA1C 5.6 08/26/2019 06:56 AM   HGBA1C 5.2 05/15/2019 04:27 PM    Last diabetic Eye exam: No results found for: HMDIABEYEEXA  Last diabetic Foot exam: No results found for: HMDIABFOOTEX   Lab Results  Component Value Date   CHOL 189 02/08/2020   HDL 60 02/08/2020   LDLCALC 93 02/08/2020   TRIG 297 (H) 02/08/2020   CHOLHDL 3.2 02/08/2020    Hepatic Function Latest Ref Rng & Units 11/17/2020 10/24/2020 10/21/2020  Total Protein 6.1 - 8.1 g/dL 6.0(L) 6.6 6.2(L)  Albumin 3.5 - 5.0 g/dL - 3.4(L) 3.1(L)  AST 10 - 35 U/L 18 33 15  ALT 6 - 29 U/L _0 Alk Phosphatase 38 - 126 U/L - 65 66  Total Bilirubin 0.2 - 1.2 mg/dL 0.2 0.5 0.4  Bilirubin, Direct 0.1 - 0.5 mg/dL - - -      CBC Latest Ref Rng & Units 11/17/2020 10/24/2020 10/21/2020  WBC 3.8 - 10.8 Thousand/uL 5.4 6.0 7.5  Hemoglobin 11.7 - 15.5 g/dL 12.5 12.2 11.1(L)  Hematocrit 35.0 - 45.0 % 37.0 35.6(L) 32.7(L)  Platelets 140 - 400 Thousand/uL 246 267 226    No results found for: VD25OH  Clinical ASCVD: No  The 10-year ASCVD risk score (Arnett DK, et al., 2019) is: 2.7%   Values used to calculate the score:     Age: 24 years     Sex: Female     Is Non-Hispanic African American: No     Diabetic: No     Tobacco smoker: No     Systolic Blood Pressure: 381 mmHg     Is BP treated: Yes     HDL Cholesterol: 60 mg/dL     Total Cholesterol: 189 mg/dL    Depression screen [facility] 2/9 08/21/2020 02/08/2020 02/08/2019  Decreased Interest 0 0 0  Down, Depressed, Hopeless 0 0 0  PHQ - 2  Score 0 0 0  Some recent data might be hidden      Social History   Tobacco Use  Smoking Status Never  Smokeless Tobacco Never   BP Readings from Last 3 Encounters:  05/12/21 124/78  04/18/21 139/82  03/20/21 (!) 107/92   Pulse Readings from Last 3 Encounters:  05/12/21 70  04/18/21 74  03/20/21 82   Wt Readings from Last 3 Encounters:  05/12/21 235 lb (106.6 kg)  01/28/21 229 lb 6.4 oz (104.1 kg)  01/06/21 234 lb (106.1 kg)   BMI Readings from Last 3 Encounters:  05/12/21 40.34 kg/m  01/28/21 39.38 kg/m  01/06/21 40.17 kg/m    Assessment/Interventions: Review of patient past medical history, allergies, medications, health status, including review of consultants reports, laboratory and other test data, was performed as part of comprehensive evaluation and provision of chronic care management services.   SDOH:  (Social Determinants of Health) assessments and interventions performed: No  Financial  Resource Strain: Low Risk    Difficulty of Paying Living Expenses: Not hard at all   Food Insecurity: No Food Insecurity   Worried About Charity fundraiser in the Last Year: Never true   Ran Out of Food in the Last Year: Never true    SDOH Screenings   Alcohol Screen: Low Risk    Last Alcohol Screening Score (AUDIT): 0  Depression (PHQ2-9): Low Risk    PHQ-2 Score: 0  Financial Resource Strain: Low Risk    Difficulty of Paying Living Expenses: Not hard at all  Food Insecurity: No Food Insecurity   Worried About Charity fundraiser in the Last Year: Never true   Ran Out of Food in the Last Year: Never true  Housing: Low Risk    Last Housing Risk Score: 0  Physical Activity: Inactive   Days of Exercise per Week: 0 days   Minutes of Exercise per Session: 0 min  Social Connections: Socially Isolated   Frequency of Communication with Friends and Family: More than three times a week   Frequency of Social Gatherings with Friends and Family: Twice a week   Attends  Religious Services: Never   Marine scientist or Organizations: No   Attends Archivist Meetings: Never   Marital Status: Widowed  Stress: No Stress Concern Present   Feeling of Stress : Not at all  Tobacco Use: Low Risk    Smoking Tobacco Use: Never   Smokeless Tobacco Use: Never   Passive Exposure: Not on file  Transportation Needs: No Transportation Needs   Lack of Transportation (Medical): No   Lack of Transportation (Non-Medical): No    CCM Care Plan  Allergies  Allergen Reactions   Other    Penicillins Hives and Rash    No anaphylactic or severe cutaneous Sx > 10 yr ago, no additional medical attention required Tolerates Keflex and Rocephin   Tetracyclines & Related Other (See Comments)    Syncope and put her "in a coma"   Tramadol Other (See Comments)    Seizures   Phenazopyridine Other (See Comments)    Unknown   Tetracycline Other (See Comments)    Syncope and "Put me in a coma"   Ciprofloxacin Rash and Itching   Codeine Itching and Rash   Estradiol Rash    Patches broke out the skin    Medications Reviewed Today     Reviewed by Edythe Clarity, [facility] (Pharmacist) on 05/29/21 at 1155  Med List Status: <None>   Medication Order Taking? Sig Documenting Provider Last Dose Status Informant  AIMOVIG 70 MG/ML SOAJ [id number] Yes ADMINISTER 1 ML UNDER THE SKIN EVERY 30 DAYS  Patient taking differently: Inject 1 mL into the skin every 30 (thirty) days.   [name], MD Taking Active   albuterol [facility] HFA) [phone] Base) MCG/ACT inhaler [id number] Yes Inhale 2 puffs = 150mg into the lungs every 6 (six) hours as needed for wheezing or shortness of breath. MEulogio Bear NP Taking Active Self  busPIRone (BUSPAR) 15 MG tablet [id number] Take 15 mg by mouth 3 (three) times daily.  [provider] Taking Active Self  Calcium Carbonate-Vit D-Min (CALCIUM 1200 PO) [id number] Take 1,200 mg by mouth daily.  [provider]  Taking Active Self  cyclobenzaprine (FLEXERIL) 10 MG tablet [id number] Take 10 mg by mouth 2 (two) times daily as needed for muscle spasms.  [provider] Taking Active Self  DULoxetine (  CYMBALTA) 60 MG capsule [id number] Yes TAKE 1 CAPSULE BY MOUTH EVERY DAY [name], MD Taking Active Self  escitalopram (LEXAPRO) 10 MG tablet [id number] Yes Take 10 mg by mouth daily. [provider] Taking Active Self  estradiol (ESTRACE) 1 MG tablet [id number] Yes TAKE 1 TABLET AT BEDTIME [name], MD Taking Active Self  famotidine (PEPCID) 40 MG tablet [id number] Yes Take 1 tablet (40 mg total) by mouth at bedtime. [name], MD Taking Active   fluticasone [facility]) 50 MCG/ACT nasal spray [id number] Yes SPRAY 2 SPRAYS INTRO EACH NOSTRIL EVERY DAY  Patient taking differently: Place 2 sprays into both nostrils daily.   [name], MD Taking Active   haloperidol (HALDOL) 5 MG tablet [id number] Yes Take 1 tablet (5 mg total) by mouth at bedtime. For psychosis Barton Dubois, MD Taking Active Self  levocetirizine (XYZAL) 5 MG tablet [id number] Yes Take 1 tablet (5 mg total) by mouth every evening. [name], MD Taking Active   LINZESS 290 MCG CAPS capsule [id number] Yes TAKE 1 CAPSULE BY MOUTH EVERY MORNING [name], MD Taking Active Self  meloxicam (MOBIC) 15 MG tablet [id number] Yes Take 15 mg by mouth at bedtime.  [provider] Taking Active Self  metoprolol succinate (TOPROL-XL) 25 MG 24 hr tablet [id number] Yes TAKE 3 TABLETS BY MOUTH EVERY DAY [name], MD Taking Active   Multiple Vitamins-Minerals (MULTIVITAMIN WITH MINERALS) tablet [id number] Yes Take 1 tablet by mouth daily. [provider] Taking Active Self  OVER THE [place] [id number] Yes Fennel Seed [provider] Taking Active Self  oxyCODONE-acetaminophen (PERCOCET) 7.5-325 MG tablet [id number] Yes Take 1 tablet by mouth 5 (five) times daily as  needed for severe pain. [provider] Taking Active Self           Med Note (,  L   Thu May 28, 2021  9:12 AM) Patient taking 6 times per day  pantoprazole (PROTONIX) 40 MG tablet [id number] Yes TAKE 1 TABLET BY MOUTH TWICE DAILY [name], MD Taking Active Self  potassium chloride SA (KLOR-CON M) 20 MEQ tablet [id number] Yes TAKE 2 TABLETS BY MOUTH EVERY DAY [name], MD Taking Active   prazosin (MINIPRESS) 2 MG capsule [id number] Yes Take 2 mg by mouth at bedtime. [provider] Taking Active Self  pregabalin (LYRICA) 100 MG capsule [id number] Yes Take 100 mg by mouth 2 (two) times daily. [provider] Taking Active Self  promethazine (PHENERGAN) 25 MG tablet [id number] Yes Take 1 tablet (25 mg total) by mouth every 8 (eight) hours as needed for nausea or vomiting. [name], DPM Taking Active   solifenacin (VESICARE) 10 MG tablet [id number] Yes Take 1 tablet (10 mg total) by mouth daily. [name], MD Taking Active Self  SUMAtriptan (IMITREX) 50 MG tablet [id number] Yes TAKE 1 TAB EVERY 2 HOURS AS NEEDED FOR MIGRAINE. MAY REPEAT IN 2 HOURS IF PERSISTS OR RECURS  Patient taking differently: Take 50 mg by mouth every 2 (two) hours as needed for migraine.   [name], MD Taking Active   topiramate (TOPAMAX) 50 MG tablet [id number] Yes TAKE 1 TABLET BY MOUTH TWICE DAILY [name], MD Taking Active Self  traZODone (DESYREL) 100 MG tablet [id number] Yes Take 100 mg by mouth at bedtime. [provider] Taking Active Self           Med Note Rosana Hoes,  L   Thu May 28, 2021  9:10 AM) Patient reports taking two tablets daily            Patient Active Problem List   Diagnosis Date Noted   Open wound of right foot, initial encounter 10/21/2020   Finger ulcer ([place]) 10/21/2020   Syncope 08/26/2019   Non-healing open wound of toe 05/15/2019   Osteomyelitis ([place]) 05/15/2019   Palpitations 10/26/2018    Foot ulcer ([place]) 01/31/2018   Hyperglycemia 01/31/2018   Left leg cellulitis 09/24/2017   Syncope and collapse    Near syncope 11/23/2016   Abdominal pain 09/23/2016   Anxiety 09/23/2016   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[place]) 09/23/2016   Constipation 09/23/2016   DDD (degenerative disc disease), lumbosacral 09/23/2016   Migraines 09/23/2016   Myelopathy ([place]) 09/23/2016   Nausea 09/23/2016   Post laminectomy syndrome 09/23/2016   Pseudoarthrosis of lumbar spine 09/23/2016   Seizures ([place]) 09/23/2016   Shortness of breath 09/23/2016   Sleep apnea 09/23/2016   Spinal stenosis of lumbar region 09/23/2016   Vertigo 09/23/2016   Hallux malleus 11/15/2015   Chronic pain syndrome    GERD (gastroesophageal reflux disease)    Depression    Rash 11/14/2015   Toe ulcer, right ([place]) 04/11/2015   Cellulitis of toe of right foot 04/11/2015   Nonspecific chest pain    Chest pain 10/26/2014   Obesity (BMI 30-39.9) 10/26/2014   Hypokalemia 10/26/2014   Manic bipolar I disorder ([place]) 10/26/2014   Atypical angina ([place]) 10/26/2014   Tardive dyskinesia 08/26/2011   Manic bipolar I disorder with rapid cycling ([place]) 05/18/2011    Class: Acute   Asthma 02/24/2011   Essential hypertension 02/24/2011   History of migraine headaches 02/24/2011    Immunization History  Administered Date(s) Administered   Influenza Split 01/17/2014, 01/08/2018   Influenza,inj,Quad PF,6+ Mos 02/10/2015, 05/05/2017, 12/21/2017, 02/08/2019, 02/08/2020, 05/12/2021   Influenza,inj,quad, With Preservative 02/10/2015, 05/05/2017, 12/21/2017, 02/08/2019   Influenza-Unspecified 01/17/2014, 01/08/2018, 02/08/2020   Pneumococcal Polysaccharide-23 05/29/2015   Tdap 02/20/2018    Conditions to be addressed/monitored:  HTN, Migraines, Asthma, Bipolar Depression, Anxiety,   Care Plan : General Pharmacy (Adult)  Updates made by Edythe Clarity, RPH since 05/29/2021 12:00 AM     Problem: HTN, Migraines, Asthma,  Bipolar Depression, Anxiety,   Priority: High  Onset Date: 05/29/2021     Long-Range Goal: Patient-Specific Goal   Start Date: 05/28/2021  Expected End Date: 11/26/2021  This Visit's Progress: On track  Priority: High  Note:   Current Barriers:  None at this time  Pharmacist Clinical Goal(s):  Patient will maintain control of BP and depression as evidenced by symptoms  through collaboration with PharmD and provider.   Interventions: 1:1 collaboration with [name], MD regarding development and update of comprehensive plan of care as evidenced by provider attestation and co-signature Inter-disciplinary care team collaboration (see longitudinal plan of care) Comprehensive medication review performed; medication list updated in electronic medical record  Hypertension (BP goal <140/90) -Controlled -Current treatment: Metoprolol XL 2m daily Appropriate, Effective, Safe, Accessible -Medications previously tried: HCTZ  -Current home readings: 120s/75 -Current exercise habits: minimal -Denies hypotensive/hypertensive symptoms -Educated on BP goals and benefits of medications for prevention of heart attack, stroke and kidney damage; Importance of home blood pressure monitoring; Symptoms of hypotension and importance of maintaining adequate hydration; -Counseled to monitor BP at home a few times per week, document, and provide log at future appointments -Recommended to continue current medication BP controlled at home per her reports.  Asthma (Goal: control symptoms and prevent exacerbations) -Controlled -Current treatment  Albuterol HFA 90 mcg prn Appropriate, Effective, Safe, Accessible -Medications previously tried: none noted   -Pulmonary function testing: Pulmonary Functions Testing Results:  No results found for: FEV1, FVC, FEV1FVC, TLC, DLCO  -Exacerbations requiring treatment in last 6 months: none -Patient denies consistent use of maintenance inhaler -Frequency  of rescue inhaler use: as needed -Counseled on When to use rescue inhaler Differences between maintenance and rescue inhalers -Recommended to continue current medication Denies SOB with ADL - only during over exertion  Depression/Anxiety/Bipolar (Goal: Reduce/minimize symptoms) -Controlled -Current treatment: Buspirone 24m TID Appropriate, Effective, Safe, Accessible Duloxetine 612mdaily  Appropriate, Effective, Safe, Accessible Lexapro 1067maily  Appropriate, Effective, Safe, Accessible Haloperidol 5mg61m  Appropriate, Effective, Safe, Accessible Prazosin 2mg 55m Appropriate, Effective, Safe, Accessible -Medications previously tried/failed: none noted -PHQ9:  PHQ9 SCORE ONLY 08/21/2020 02/08/2020 02/08/2019  PHQ-9 Total Score 0 0 0  Followed by pshyc. She reports her mood is stable sleep is adequare -Recommended to continue current medication No changes at this time will defer to psych.  Migraines (Goal: Reduce migraines) -Controlled -Current treatment  Aimovig 70mg 74m monthly  Appropriate, Effective, Safe, Accessible Topamax 50mg B29mAppropriate, Effective, Safe, Accessible -Medications previously tried: none noted -Denies any migraines in about 1 year  -Recommended to continue current medication Meds are all affordable as she has medicare/medicaid.  Patient Goals/Self-Care Activities Patient will:  - take medications as prescribed as evidenced by patient report and record review focus on medication adherence by med synch and delivery target a minimum of 150 minutes of moderate intensity exercise weekly  Follow Up Plan: The care management team will reach out to the patient again over the next 120 days.       Medication Assistance: None required.  Patient affirms current coverage meets needs.  Compliance/Adherence/Medication fill history: Care Gaps: None at this time  Star-Rating Drugs: N/A  Patient's preferred pharmacy is:  [address] [phone] [facility] [place] [id number] [phone] box? Yes Pt endorses 100% compliance  We discussed: Benefits of medication synchronization, packaging and delivery as well as enhanced pharmacist oversight with [facility]. Patient decided to: Utilize [facility] pharmacy for medication synchronization, packaging and delivery  Verbal consent obtained for [facility] enhanced pharmacy services (medication synchronization, adherence packaging, delivery coordination). A medication sync plan was created to allow patient to get all medications delivered once every 30 to 90 days per patient preference. Patient understands they have freedom to choose pharmacy and clinical pharmacist will coordinate care between all prescribers and [facility].  Care Plan and Follow Up Patient Decision:  Patient agrees to Care Plan and Follow-up.  Plan: The care management team will reach out to the patient again over the next 120 days.  ChristiBeverly MilchD, CPP Clinical Pharmacist Practitioner [phone]

## 2021-05-21 NOTE — Progress Notes (Signed)
°  Chronic Care Management   Note  05/21/2021 Name: [name] [name] MRN: [id number] DOB: 03/14/1966  [name] [name] is a 56 y.o. year old female who is a primary care patient of Dennard Schaumann, Cammie Mcgee, MD. I reached out to [name] by phone today in response to a referral sent by Ms. Leone Payor Hilmes's PCP, Dennard Schaumann Cammie Mcgee, MD.   [name] was given information about Chronic Care Management services today including:  CCM service includes personalized support from designated clinical staff supervised by her physician, including individualized plan of care and coordination with other care providers 24/7 contact phone numbers for assistance for urgent and routine care needs. Service will only be billed when office clinical staff spend 20 minutes or more in a month to coordinate care. Only one practitioner may furnish and bill the service in a calendar month. The patient may stop CCM services at any time (effective at the end of the month) by phone call to the office staff.   Patient agreed to services and verbal consent obtained.   Follow up plan:   Tatjana Secretary/administrator

## 2021-05-22 ENCOUNTER — Telehealth: Payer: Self-pay | Admitting: Pharmacist

## 2021-05-22 ENCOUNTER — Telehealth: Payer: Self-pay

## 2021-05-22 NOTE — Telephone Encounter (Signed)
° °  Name: [name] [name]  DOB: Sep 08, 1965  MRN: [id number]   Primary Cardiologist: Quay Burow, MD  Chart reviewed as part of pre-operative protocol coverage. Patient was contacted 05/22/2021 in reference to pre-operative risk assessment for pending surgery as outlined below.  [name] [name] was last seen on 01/28/21 by Dr. [name].  Since that day, [name] [name] has done well from a cardiac perspective.  Therefore, based on ACC/AHA guidelines, the patient would be at acceptable risk for the planned procedure without further cardiovascular testing.   The patient was advised that if she develops new symptoms prior to surgery to contact our office to arrange for a follow-up visit, and she verbalized understanding.  I will route this recommendation to the requesting party via Epic fax function and remove from pre-op pool. Please call with questions.  [name] [name] Ninfa Meeker, PA-C 05/22/2021, 1:19 PM

## 2021-05-22 NOTE — Telephone Encounter (Signed)
° °  Pre-operative Risk Assessment    Patient Name: [name] [name]  DOB: [birth year] MRN: [id number]      Request for Surgical Clearance    Procedure:   RIGHT TOTAL SHOULDER ARTHROPLASTY  Date of Surgery:  Clearance TBD                                 Surgeon:  Ophelia Charter, MD Surgeon's Group or Practice Name:  [facility]. ATTNVenida Jarvis Phone number:  [phone] Fax number:  [phone]   Type of Clearance Requested:   - Medical    Type of Anesthesia:   NOT LISTED   Additional requests/questions:

## 2021-05-22 NOTE — Telephone Encounter (Signed)
Called and LVM to call back and ask for pre-op team.

## 2021-05-22 NOTE — Chronic Care Management (AMB) (Signed)
Chronic Care Management Pharmacy Assistant   Name: [name] [name]  MRN: [id number] DOB: July 30, 1965   Reason for Encounter: Chart Review For Initial Visit With Clinical Pharmacist   Conditions to be addressed/monitored: HTN, GERD, Asthma, Manic Bipolar I  Primary concerns for visit include: HTN   Recent office visits:  05/12/2021 OV (PCP) [name], MD;  I recommended adding famotidine 40 mg p.o. nightly to the pantoprazole that she is taking.  If this does not help we can try stopping the meloxicam.   Recent consult visits:  05/14/2021 (Podiatry) [name], DPM; no medication changes indicated.  04/27/2021 (Podiatry) [name], DPM; 05/14/2021 (Podiatry) [name], DPM;   04/09/2021 (Podiatry) [name], DPM; no medication changes indicated.  04/02/2021 (Podiatry) [name], DPM;  Due to the mild erythema I think is more from the sutures from soap and restart cephalexin.  03/24/2021 (Podiatry) [name], DPM; no medication changes indicated.  03/20/2021 (Podiatry) [name], DPM; no medication changes indicated.  03/06/2021 (Podiatry) [name], DPM; no medication changes indicated.  02/23/2021 (Podiatry) [name], DPM; no medication changes indicated.  01/28/2021 (Cardiology) [name], MD; no medication changes indicated.  01/22/2021 (Podiatry) [name], DPM; no medication changes indicated.  01/08/2021 (Podiatry) [name], DPM; no medication changes indicated.  12/30/2020 (Podiatry) [name], DPM; no medication changes indicated.  12/23/2020 (Podiatry) [name], DPM; no medication changes indicated.  12/18/2020 (Podiatry) [name], DPM; no medication changes indicated.  12/11/2020 (Podiatry) [name], DPM; no medication changes indicated.  12/02/2020 (Podiatry) [name], DPM; no medication  changes indicated.  11/27/2020 (Podiatry) [name], DPM; no medication changes indicated.  11/24/2020 (Podiatry) [name], DPM; no medication changes indicated.  Hospital visits:  04/17/2021 ED visit for Internal derangement of right shoulder -No medication changes indicated  02/03/2021 Admission for elective surgery - Revision of Lumbar Fusion w/hardware removal  01/06/2021 ED visit for Fall -No medication changes indicated.  Medications: Outpatient Encounter Medications as of 05/22/2021  Medication Sig   AIMOVIG 70 MG/ML SOAJ ADMINISTER 1 ML UNDER THE SKIN EVERY 30 DAYS (Patient taking differently: Inject 1 mL into the skin every 30 (thirty) days.)   albuterol (PROAIR HFA) 108 (90 Base) MCG/ACT inhaler Inhale 2 puffs = 1100mcg into the lungs every 6 (six) hours as needed for wheezing or shortness of breath.   busPIRone (BUSPAR) 15 MG tablet Take 15 mg by mouth 3 (three) times daily.    Calcium Carbonate-Vit D-Min (CALCIUM 1200 PO) Take 1,200 mg by mouth daily.    cyclobenzaprine (FLEXERIL) 10 MG tablet Take 10 mg by mouth 2 (two) times daily as needed for muscle spasms.    DULoxetine (CYMBALTA) 60 MG capsule TAKE 1 CAPSULE BY MOUTH EVERY DAY   escitalopram (LEXAPRO) 10 MG tablet Take 10 mg by mouth daily.   estradiol (ESTRACE) 1 MG tablet TAKE 1 TABLET AT BEDTIME   famotidine (PEPCID) 40 MG tablet Take 1 tablet (40 mg total) by mouth at bedtime.   fluticasone (FLONASE) 50 MCG/ACT nasal spray SPRAY 2 SPRAYS INTRO EACH NOSTRIL EVERY DAY (Patient taking differently: Place 2 sprays into both nostrils daily.)   haloperidol (HALDOL) 5 MG tablet Take 1 tablet (5 mg total) by mouth at bedtime. For psychosis   levocetirizine (XYZAL) 5 MG tablet Take 1 tablet (5 mg total) by mouth every evening.   LINZESS 290 MCG CAPS capsule  TAKE 1 CAPSULE BY MOUTH EVERY MORNING   meloxicam (MOBIC) 15 MG tablet Take 15 mg by mouth at bedtime.    metoprolol succinate (TOPROL-XL) 25 MG 24 hr  tablet TAKE 1 TABLET BY MOUTH DAILY   Multiple Vitamins-Minerals (MULTIVITAMIN WITH MINERALS) tablet Take 1 tablet by mouth daily.   OVER THE COUNTER MEDICATION Fennel Seed   oxyCODONE-acetaminophen (PERCOCET) 7.5-325 MG tablet Take 1 tablet by mouth 5 (five) times daily as needed for severe pain.   pantoprazole (PROTONIX) 40 MG tablet TAKE 1 TABLET BY MOUTH TWICE DAILY   potassium chloride SA (KLOR-CON M) 20 MEQ tablet TAKE 2 TABLETS BY MOUTH EVERY DAY   prazosin (MINIPRESS) 2 MG capsule Take 2 mg by mouth at bedtime.   pregabalin (LYRICA) 100 MG capsule Take 100 mg by mouth 2 (two) times daily.   promethazine (PHENERGAN) 25 MG tablet Take 1 tablet (25 mg total) by mouth every 8 (eight) hours as needed for nausea or vomiting.   solifenacin (VESICARE) 10 MG tablet Take 1 tablet (10 mg total) by mouth daily.   SUMAtriptan (IMITREX) 50 MG tablet TAKE 1 TAB EVERY 2 HOURS AS NEEDED FOR MIGRAINE. MAY REPEAT IN 2 HOURS IF PERSISTS OR RECURS (Patient taking differently: Take 50 mg by mouth every 2 (two) hours as needed for migraine.)   topiramate (TOPAMAX) 50 MG tablet TAKE 1 TABLET BY MOUTH TWICE DAILY   traZODone (DESYREL) 100 MG tablet Take 100 mg by mouth at bedtime.   No facility-administered encounter medications on file as of 05/22/2021.   Current Medications: Levocetirizine 5 mg last filled 05/13/2021 90 DS Famotidine 40 mg last filled 05/12/2021 30 DS Potassium Chloride 20 meq last filled 05/01/2021 90 DS Promethazine 25 mg last filled 04/21/2021 6 DS Metoprolol Succinate 25 mg last filled 02/24/2021 90 DS Pantoprazole 40 mg last filled 04/21/2020 90 DS Topiramate 50 mg last filled 03/10/2021 90 DS Fluticasone 50 mcg/act last filled 03/09/2021 90 DS Linzess 290 mcg last filled 05/19/2021 90 DS Solifenacin 10 mg last filled 05/19/2021 90 DS Prazosin 2 mg last filled 05/12/2021 90 DS Aimovig 70 mg/m L last filled 02/10/2021 90 DS Duloxetine 60 mg last filled 04/11/2021 90 DS Estradiol 1  mg last filled 05/12/2021 90 DS Albuterol 108 mcg/act last filled 10/24/2020 25 DS Pregabalin 100 mg last filled 04/30/2021 30 DS Oxycodone-acetaminophen 7.5-325 mg last filled 05/04/2021 30 DS Sumatriptan 50 mg -- last filled 07/13/2020 for 100 mg 30 DS Escitalopram 10 mg last filled 05/03/2021 30 DS Meloxicam 15 mg last filled 05/07/2021 30 DS Buspirone 15 mg last filled 05/17/2021 30 DS Haloperidol 5 mg last filled 04/24/2021 30 DS Cyclobenzaprine 10 mg last filled 04/29/2021 30 DS Multiple Vitmains-Minerals Trazodone 100 mg last filled 05/17/2021 30 DS Calcium-Vitamin D-Min  Patient Questions: Any changes in your medications or health? Patient states she needs a shoulder replacement.  Any side effects from any medications?  "Not really."  Do you have any symptoms or problems not managed by your medications? Patient denies having any symptoms or problems that is not currently managed by her medications.  Any concerns about your health right now? Patient denies having any concerns with her health.  Has your provider asked that you check blood pressure, blood sugar, or follow special diet at home? Patient states she checks her blood pressure occasionally. She does not follow any special diet.  Do you get any type of exercise on a regular basis? Patient states she is not currently able to exercise.  Can  you think of a goal you would like to reach for your health? "I would like to lose some weight."  Do you have any problems getting your medications? Patient denies having any problems getting her medications.  Is there anything that you would like to discuss during the appointment?  "Not really."  Please bring medications and supplements to appointment   Care Gaps: Medicare Annual Wellness: Completed 08/21/2020 Hemoglobin A1C: 5.6% on 08/26/2019 Colonoscopy: Overdue since 04/19/2021 Mammogram: Next due on 12/04/2022  Future Appointments  Date Time Provider [place]  05/28/2021  9:00 AM [facility]-CCM PHARMACIST [facility] None  06/11/2021  2:15 PM [name], DPM [facility]  08/27/2021  2:00 PM [facility]-NURSE HEALTH ADVISOR [facility] None   Star Rating Drugs: None  April D Calhoun, [place] Pharmacist Assistant [phone]

## 2021-05-28 ENCOUNTER — Ambulatory Visit (INDEPENDENT_AMBULATORY_CARE_PROVIDER_SITE_OTHER): Payer: Medicare Other | Admitting: Pharmacist

## 2021-05-28 ENCOUNTER — Telehealth: Payer: Self-pay | Admitting: Pharmacist

## 2021-05-28 ENCOUNTER — Other Ambulatory Visit: Payer: Self-pay | Admitting: Cardiovascular Disease

## 2021-05-28 ENCOUNTER — Other Ambulatory Visit: Payer: Self-pay

## 2021-05-28 DIAGNOSIS — G43001 Migraine without aura, not intractable, with status migrainosus: Secondary | ICD-10-CM

## 2021-05-28 DIAGNOSIS — F3131 Bipolar disorder, current episode depressed, mild: Secondary | ICD-10-CM

## 2021-05-28 DIAGNOSIS — I10 Essential (primary) hypertension: Secondary | ICD-10-CM

## 2021-05-28 DIAGNOSIS — J4521 Mild intermittent asthma with (acute) exacerbation: Secondary | ICD-10-CM

## 2021-05-28 MED ORDER — FAMOTIDINE 40 MG PO TABS: 40.0000 mg | ORAL_TABLET | Freq: Every day | ORAL | 3 refills | Status: DC

## 2021-05-28 NOTE — Telephone Encounter (Signed)
Called [facility] and they do not carry metatarsal pads.

## 2021-05-28 NOTE — Progress Notes (Signed)
° ° °  Chronic Care Management Pharmacy Assistant   Name: [name] [name]  MRN: [id number] DOB: Dec 08, 1965   Reason for Encounter: Requesting rx to be sent to [facility] from providers for [place] PA-C office with [facility] spine and pain and left detailed message on nurse line requesting the 3 rxs provider prescribed for patient have new rxs sent to [facility] due to onboarding.   Also called Joe Amenmastro's office with [facility] at [phone]) [phone] it continued to ring busy looked up alternative and number and called [phone] spoke to customer care rep who sent message to provider for patient have new rxs sent to [facility] due to [place]. Advised office to contact me with any questions.      Jobe Gibbon, [facility] Clinical Pharmacist Assistant  [phone]

## 2021-05-29 DIAGNOSIS — M869 Osteomyelitis, unspecified: Secondary | ICD-10-CM | POA: Diagnosis not present

## 2021-05-29 DIAGNOSIS — G4733 Obstructive sleep apnea (adult) (pediatric): Secondary | ICD-10-CM | POA: Diagnosis not present

## 2021-05-29 DIAGNOSIS — R531 Weakness: Secondary | ICD-10-CM | POA: Diagnosis not present

## 2021-05-29 NOTE — Patient Instructions (Addendum)
Visit Information   Goals Addressed             This Visit's Progress    Get Synchronized with [facility]       Timeframe:  Long-Range Goal Priority:  High Start Date:       05/29/21                      Expected End Date:   11/26/21                    Follow Up Date 08/26/21    Work to get med synched and delivered through [facility] to help with adherence.   Why is this important?   These steps will help you keep on track with your medicines.   Notes:        Patient Care Plan: General Pharmacy (Adult)     Problem Identified: HTN, Migraines, Asthma, Bipolar Depression, Anxiety,   Priority: High  Onset Date: 05/29/2021     Long-Range Goal: Patient-Specific Goal   Start Date: 05/28/2021  Expected End Date: 11/26/2021  This Visit's Progress: On track  Priority: High  Note:   Current Barriers:  None at this time  Pharmacist Clinical Goal(s):  Patient will maintain control of BP and depression as evidenced by symptoms  through collaboration with PharmD and provider.   Interventions: 1:1 collaboration with [name], MD regarding development and update of comprehensive plan of care as evidenced by provider attestation and co-signature Inter-disciplinary care team collaboration (see longitudinal plan of care) Comprehensive medication review performed; medication list updated in electronic medical record  Hypertension (BP goal <140/90) -Controlled -Current treatment: Metoprolol XL 25mg  daily Appropriate, Effective, Safe, Accessible -Medications previously tried: HCTZ  -Current home readings: 120s/75 -Current exercise habits: minimal -Denies hypotensive/hypertensive symptoms -Educated on BP goals and benefits of medications for prevention of heart attack, stroke and kidney damage; Importance of home blood pressure monitoring; Symptoms of hypotension and importance of maintaining adequate hydration; -Counseled to monitor BP at home a few times per week, document,  and provide log at future appointments -Recommended to continue current medication BP controlled at home per her reports.  Asthma (Goal: control symptoms and prevent exacerbations) -Controlled -Current treatment  Albuterol HFA 90 mcg prn Appropriate, Effective, Safe, Accessible -Medications previously tried: none noted   -Pulmonary function testing: Pulmonary Functions Testing Results:  No results found for: FEV1, FVC, FEV1FVC, TLC, DLCO  -Exacerbations requiring treatment in last 6 months: none -Patient denies consistent use of maintenance inhaler -Frequency of rescue inhaler use: as needed -Counseled on When to use rescue inhaler Differences between maintenance and rescue inhalers -Recommended to continue current medication Denies SOB with ADL - only during over exertion  Depression/Anxiety/Bipolar (Goal: Reduce/minimize symptoms) -Controlled -Current treatment: Buspirone 15mg  TID Appropriate, Effective, Safe, Accessible Duloxetine 60mg  daily  Appropriate, Effective, Safe, Accessible Lexapro 10mg  daily  Appropriate, Effective, Safe, Accessible Haloperidol 5mg  hs  Appropriate, Effective, Safe, Accessible Prazosin 2mg  hs  Appropriate, Effective, Safe, Accessible -Medications previously tried/failed: none noted -PHQ9:  PHQ9 SCORE ONLY 08/21/2020 02/08/2020 02/08/2019  PHQ-9 Total Score 0 0 0  Followed by [facility]. She reports her mood is stable sleep is adequare -Recommended to continue current medication No changes at this time will defer to psych.  Migraines (Goal: Reduce migraines) -Controlled -Current treatment  Aimovig 70mg  once monthly  Appropriate, Effective, Safe, Accessible Topamax 50mg  BID  Appropriate, Effective, Safe, Accessible -Medications previously tried: none noted -Denies any migraines in about 1  year  -Recommended to continue current medication Meds are all affordable as she has medicare/medicaid.  Patient Goals/Self-Care Activities Patient will:  -  take medications as prescribed as evidenced by patient report and record review focus on medication adherence by med synch and delivery target a minimum of 150 minutes of moderate intensity exercise weekly  Follow Up Plan: The care management team will reach out to the patient again over the next 120 days.      Ms. Lagoy was given information about Chronic Care Management services today including:  CCM service includes personalized support from designated clinical staff supervised by her physician, including individualized plan of care and coordination with other care providers 24/7 contact phone numbers for assistance for urgent and routine care needs. Standard insurance, coinsurance, copays and deductibles apply for chronic care management only during months in which we provide at least 20 minutes of these services. Most insurances cover these services at 100%, however patients may be responsible for any copay, coinsurance and/or deductible if applicable. This service may help you avoid the need for more expensive face-to-face services. Only one practitioner may furnish and bill the service in a calendar month. The patient may stop CCM services at any time (effective at the end of the month) by phone call to the office staff.  Patient agreed to services and verbal consent obtained.   The patient verbalized understanding of instructions, educational materials, and care plan provided today and agreed to receive a mailed copy of patient instructions, educational materials, and care plan.  Telephone follow up appointment with pharmacy team member scheduled for: 4 months  Edythe Clarity, [place], PharmD, [place] Clinical Pharmacist Practitioner [place] [phone]

## 2021-06-01 ENCOUNTER — Other Ambulatory Visit: Payer: Self-pay | Admitting: *Deleted

## 2021-06-02 DIAGNOSIS — M7062 Trochanteric bursitis, left hip: Secondary | ICD-10-CM | POA: Diagnosis not present

## 2021-06-02 DIAGNOSIS — M4326 Fusion of spine, lumbar region: Secondary | ICD-10-CM | POA: Diagnosis not present

## 2021-06-08 ENCOUNTER — Emergency Department (HOSPITAL_COMMUNITY)
Admission: EM | Admit: 2021-06-08 | Discharge: 2021-06-09 | Disposition: A | Discharge status: Home / Self Care | Payer: Medicare Other | Attending: Emergency Medicine | Admitting: Emergency Medicine

## 2021-06-08 ENCOUNTER — Encounter (HOSPITAL_COMMUNITY): Payer: Self-pay | Admitting: Emergency Medicine

## 2021-06-08 ENCOUNTER — Other Ambulatory Visit: Payer: Self-pay

## 2021-06-08 DIAGNOSIS — H538 Other visual disturbances: Secondary | ICD-10-CM | POA: Insufficient documentation

## 2021-06-08 DIAGNOSIS — J45909 Unspecified asthma, uncomplicated: Secondary | ICD-10-CM | POA: Diagnosis not present

## 2021-06-08 DIAGNOSIS — I10 Essential (primary) hypertension: Secondary | ICD-10-CM | POA: Diagnosis not present

## 2021-06-08 DIAGNOSIS — R519 Headache, unspecified: Secondary | ICD-10-CM | POA: Diagnosis not present

## 2021-06-08 DIAGNOSIS — R6889 Other general symptoms and signs: Secondary | ICD-10-CM | POA: Diagnosis not present

## 2021-06-08 DIAGNOSIS — R200 Anesthesia of skin: Secondary | ICD-10-CM | POA: Diagnosis not present

## 2021-06-08 LAB — BASIC METABOLIC PANEL
Anion gap: 11 (ref 5–15)
BUN: 18 mg/dL (ref 6–20)
CO2: 24 mmol/L (ref 22–32)
Calcium: 9.5 mg/dL (ref 8.9–10.3)
Chloride: 102 mmol/L (ref 98–111)
Creatinine, Ser: 0.69 mg/dL (ref 0.44–1.00)
GFR, Estimated: >60 mL/min (ref >60)
Glucose, Bld: 110 mg/dL — ABNORMAL HIGH (ref 70–99)
Potassium: 4.7 mmol/L (ref 3.5–5.1)
Sodium: 137 mmol/L (ref 135–145)

## 2021-06-08 LAB — CBC
HCT: 40.1 % (ref 36.0–46.0)
Hemoglobin: 13.4 g/dL (ref 12.0–15.0)
MCH: 32.4 pg (ref 26.0–34.0)
MCHC: 33.4 g/dL (ref 30.0–36.0)
MCV: 96.9 fL (ref 80.0–100.0)
Platelets: 269 10*3/uL (ref 150–400)
RBC: 4.14 MIL/uL (ref 3.87–5.11)
RDW: 13.1 % (ref 11.5–15.5)
WBC: 7.1 10*3/uL (ref 4.0–10.5)
nRBC: 0 % (ref 0.0–0.2)

## 2021-06-08 LAB — CBG MONITORING, ED: Glucose-Capillary: 113 mg/dL — ABNORMAL HIGH (ref 70–99)

## 2021-06-08 NOTE — ED Provider Triage Note (Signed)
Emergency Medicine Provider Triage Evaluation Note  [name] [name] , a 56 y.o. female  was evaluated in triage.  Pt complains of tongue numbness x 1.5 hrs. No other sx. 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 Wakes with cane at home.   Review of Systems  As above  Physical Exam  BP 137/87 (BP Location: Left Arm)    Pulse 88    Temp 98 F (36.7 C)    Resp 17    SpO2 97%  Gen:   Awake, no distress   Resp:  Normal effort  MSK:   Moves extremities without difficulty  Other:  No facial droop, no slurred speech, no pronator drift  Medical Decision Making  Medically screening exam initiated at 7:45 PM.  Appropriate orders placed.  [name] [name] was informed that the remainder of the evaluation will be completed by another provider, this initial triage assessment does not replace that evaluation, and the importance of remaining in the ED until their evaluation is complete.     Kateri Plummer, PA-C 06/08/21 1945

## 2021-06-08 NOTE — ED Notes (Signed)
Patient stated her right eye is becoming cloudy and hard to see out of. Triage RN made aware

## 2021-06-08 NOTE — ED Triage Notes (Signed)
Patient here with tongue numbness for the last hour and a half.  Patient has no facial droop, equal hand grips and no weakness.  Patient has started taking Lyrica again after stopping.

## 2021-06-09 ENCOUNTER — Emergency Department (HOSPITAL_COMMUNITY): Payer: Medicare Other

## 2021-06-09 DIAGNOSIS — R200 Anesthesia of skin: Secondary | ICD-10-CM | POA: Diagnosis not present

## 2021-06-09 DIAGNOSIS — R519 Headache, unspecified: Secondary | ICD-10-CM | POA: Diagnosis not present

## 2021-06-09 MED ORDER — PROCHLORPERAZINE EDISYLATE 10 MG/2ML IJ SOLN
10.0000 mg | Freq: Once | INTRAMUSCULAR | Status: AC
  Administered 2021-06-09: 10 mg via INTRAVENOUS
  Filled 2021-06-09: qty 2

## 2021-06-09 MED ORDER — SODIUM CHLORIDE 0.9 % IV BOLUS
1000.0000 mL | Freq: Once | INTRAVENOUS | Status: AC
Start: 2021-06-09 — End: 2021-06-09
  Administered 2021-06-09: 1000 mL via INTRAVENOUS

## 2021-06-09 MED ORDER — DIPHENHYDRAMINE HCL 50 MG/ML IJ SOLN
25.0000 mg | Freq: Once | INTRAMUSCULAR | Status: AC
  Administered 2021-06-09: 25 mg via INTRAVENOUS
  Filled 2021-06-09: qty 1

## 2021-06-09 MED ORDER — HALOPERIDOL LACTATE 5 MG/ML IJ SOLN
2.0000 mg | Freq: Once | INTRAMUSCULAR | Status: AC
  Administered 2021-06-09: 2 mg via INTRAVENOUS
  Filled 2021-06-09: qty 1

## 2021-06-09 MED ORDER — KETOROLAC TROMETHAMINE 15 MG/ML IJ SOLN
15.0000 mg | Freq: Once | INTRAMUSCULAR | Status: AC
  Administered 2021-06-09: 15 mg via INTRAVENOUS
  Filled 2021-06-09: qty 1

## 2021-06-09 NOTE — ED Provider Notes (Addendum)
[facility] Emergency Department Provider Note MRN:  [id number]  Arrival date & time: 06/09/21     Chief Complaint   Numbness   History of Present Illness   [name] [name] is a 56 y.o. year-old female with a history of leg pains presenting to the ED with chief complaint of numbness.  Patient is experiencing diffuse headache, numbness to the tongue, and blurriness of vision of the right eye for the past 10 hours.  Sudden onset.  No numbness or weakness to the arms or legs that is new.  Has chronic neuropathy that is unchanged.  No chest pain or shortness of breath or abdominal pain.  No recent fever.  Review of Systems  A thorough review of systems was obtained and all systems are negative except as noted in the HPI and PMH.   Patient's Health History    Past Medical History:  Diagnosis Date   Anxiety    Arthritis    Phreesia 02/08/2020   Asthma    mild intermittent   Asthma    Phreesia 02/08/2020   Bipolar 1 disorder ([place])    ect treatments last treatment Sep 02 1011   Depression    Depression    Phreesia 02/08/2020   Depression    Phreesia 07/10/2020   GERD (gastroesophageal reflux disease)    Hypertension    Pre-diabetes    Seizures ([place])    last seizure was so long ago she can't remember.    Sleep apnea    wears CPAP, uncertain of setting   Substance abuse ([place])    Phreesia 02/08/2020    Past Surgical History:  Procedure Laterality Date   ABDOMINAL HYSTERECTOMY     AMPUTATION TOE Left 02/02/2018   Procedure: AMPUTATION TOE Left 4th toe;  Surgeon: [name], DPM;  Location: [facility];  Service: Podiatry;  Laterality: Left;   APPENDECTOMY N/A    Phreesia 02/08/2020   BACK SURGERY     CARPAL TUNNEL RELEASE     x2   IRRIGATION AND DEBRIDEMENT ABSCESS Right 10/25/2020   Procedure: IRRIGATION AND DEBRIDEMENT ABSCESS OF FOOT AND APPLICATION OF GRAFT;  Surgeon: [name], DPM;  Location: [facility];  Service: Podiatry;  Laterality:  Right;   Laproscopic knee surgery     NECK SURGERY  03/15/2017   PLANTAR FASCIA RELEASE     x2   SPINE SURGERY N/A    Phreesia 02/08/2020   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   Procedure: TRANSMETATARSAL AMPUTATION;  Surgeon: [name], DPM;  Location: [facility];  Service: Podiatry;  Laterality: Right;    Family History  Problem Relation Age of Onset   Diabetes Mother    COPD Mother    Hypertension Mother    Hyperlipidemia Mother    Heart disease Father    Hyperlipidemia Father    Hypertension Father    Cancer Father    Heart disease Brother    Heart disease Daughter    Heart disease Maternal Grandmother    Hypertension Maternal Grandmother    Heart disease Maternal Grandfather    Kidney cancer Paternal Grandmother    Heart disease Paternal Grandfather     Social History   Socioeconomic History   Marital status: Widowed    Spouse name: Not on file   Number of children: 3   Years of education: 67   Highest education level: Not on file  Occupational History   Occupation: Disabled  Tobacco Use   Smoking status: Never  Smokeless tobacco: Never  Vaping Use   Vaping Use: Never used  Substance and Sexual Activity   Alcohol use: No   Drug use: No   Sexual activity: Not on file  Other Topics Concern   Not on file  Social History Narrative   Patient drink 4 16oz caffeine drinks a day    Social Determinants of Health   Financial Resource Strain: Low Risk    Difficulty of Paying Living Expenses: Not hard at all  Food Insecurity: No Food Insecurity   Worried About Charity fundraiser in the Last Year: Never true   Ran Out of Food in the Last Year: Never true  Transportation Needs: No Transportation Needs   Lack of Transportation (Medical): No   Lack of Transportation (Non-Medical): No  Physical Activity: Inactive   Days of Exercise per Week: 0 days   Minutes of Exercise per Session: 0 min  Stress: No Stress Concern Present   Feeling of Stress : Not  at all  Social Connections: Socially Isolated   Frequency of Communication with Friends and Family: More than three times a week   Frequency of Social Gatherings with Friends and Family: Twice a week   Attends Religious Services: Never   Marine scientist or Organizations: No   Attends Archivist Meetings: Never   Marital Status: Widowed  Human resources officer Violence: Not At Risk   Fear of Current or Ex-Partner: No   Emotionally Abused: No   Physically Abused: No   Sexually Abused: No     Physical Exam   Vitals:   06/09/21 0500 06/09/21 0530  BP: 138/81 (!) 136/92  Pulse: 73 69  Resp: 18 16  Temp:    SpO2: 99% 98%    CONSTITUTIONAL: Well-appearing, NAD NEURO/PSYCH:  Alert and oriented x 3, normal and symmetric strength and sensation to the arms and legs, normal speech, no visual field cuts EYES:  eyes equal and reactive ENT/NECK:  no LAD, no JVD CARDIO: Regular rate, well-perfused, normal S1 and S2 PULM:  CTAB no wheezing or rhonchi GI/GU:  non-distended, non-tender MSK/SPINE:  No gross deformities, no edema SKIN:  no rash, atraumatic   *Additional and/or pertinent findings included in MDM below  Diagnostic and Interventional Summary    EKG Interpretation  Date/Time:    Ventricular Rate:    PR Interval:    QRS Duration:   QT Interval:    QTC Calculation:   R Axis:     Text Interpretation:         Labs Reviewed  BASIC METABOLIC PANEL - Abnormal; Notable for the following components:      Result Value   Glucose, Bld 110 (*)    All other components within normal limits  CBG MONITORING, ED - Abnormal; Notable for the following components:   Glucose-Capillary 113 (*)    All other components within normal limits  CBC    CT Head Wo Contrast  Final Result    MR BRAIN WO CONTRAST    (Results Pending)    Medications  diphenhydrAMINE (BENADRYL) injection 25 mg (25 mg Intravenous Given 06/09/21 0346)  prochlorperazine (COMPAZINE) injection 10 mg  (10 mg Intravenous Given 06/09/21 0344)  sodium chloride 0.9 % bolus 1,000 mL (0 mLs Intravenous Stopped 06/09/21 0451)  ketorolac (TORADOL) 15 MG/ML injection 15 mg (15 mg Intravenous Given 06/09/21 0507)  haloperidol lactate (HALDOL) injection 2 mg (2 mg Intravenous Given 06/09/21 0506)     Procedures  /  Critical  Care Procedures  ED Course and Medical Decision Making  Initial Impression and Ddx Favoring complex migraine, will obtain MRI to exclude small stroke.  Past medical/surgical history that increases complexity of ED encounter: History of migraine  Interpretation of Diagnostics Labs overall reassuring.  CT head without acute process.  Patient Reassessment and Ultimate Disposition/Management MRI is normal.  Patient's symptoms are improved, resolution of tongue numbness and visual disturbance.  Still with mild headache.  No indication for further testing or admission, appropriate for discharge, will follow-up with neurology.  Patient management required discussion with the following services or consulting groups:  None  Complexity of Problems Addressed Acute illness or injury that poses threat of life of bodily function  Additional Data Reviewed and Analyzed Further history obtained from: None  Additional Factors Impacting ED Encounter Risk Consideration of hospitalization  Barth Kirks. [place]onia Small, [place] [email or web address]  Final Clinical Impressions(s) / ED Diagnoses     ICD-10-CM   1. Acute nonintractable headache, unspecified headache type  R51.9       ED Discharge Orders     None        Discharge Instructions Discussed with and Provided to Patient:   Discharge Instructions   None      Maudie Flakes, MD 06/09/21 2025    Maudie Flakes, MD 06/09/21 [phone]

## 2021-06-09 NOTE — ED Notes (Signed)
Patient back from mri 

## 2021-06-09 NOTE — ED Notes (Signed)
Pt transported to MRI 

## 2021-06-09 NOTE — Discharge Instructions (Addendum)
You were evaluated in the Emergency Department and after careful evaluation, we did not find any emergent condition requiring admission or further testing in the hospital.  Your exam/testing today was overall reassuring.  Recommend continuing her home medications and following up with your neurologist to discuss your headaches.  Please return to the Emergency Department if you experience any worsening of your condition.  Thank you for allowing [place] to be a part of your care.

## 2021-06-09 NOTE — ED Notes (Signed)
Patient complaining of headache and pressure behind eyes. Triage rn notified

## 2021-06-11 ENCOUNTER — Encounter: Payer: Medicare Other | Admitting: Podiatry

## 2021-06-16 DIAGNOSIS — F3131 Bipolar disorder, current episode depressed, mild: Secondary | ICD-10-CM

## 2021-06-16 DIAGNOSIS — I10 Essential (primary) hypertension: Secondary | ICD-10-CM

## 2021-06-16 DIAGNOSIS — J4521 Mild intermittent asthma with (acute) exacerbation: Secondary | ICD-10-CM

## 2021-06-19 ENCOUNTER — Other Ambulatory Visit: Payer: Self-pay

## 2021-06-19 MED ORDER — PANTOPRAZOLE SODIUM 40 MG PO TBEC: 40.0000 mg | DELAYED_RELEASE_TABLET | Freq: Two times a day (BID) | ORAL | 2 refills | Status: DC

## 2021-06-20 ENCOUNTER — Other Ambulatory Visit: Payer: Self-pay | Admitting: Family Medicine

## 2021-06-26 DIAGNOSIS — M869 Osteomyelitis, unspecified: Secondary | ICD-10-CM | POA: Diagnosis not present

## 2021-06-26 DIAGNOSIS — G4733 Obstructive sleep apnea (adult) (pediatric): Secondary | ICD-10-CM | POA: Diagnosis not present

## 2021-06-26 DIAGNOSIS — R531 Weakness: Secondary | ICD-10-CM | POA: Diagnosis not present

## 2021-06-29 ENCOUNTER — Other Ambulatory Visit: Payer: Self-pay

## 2021-06-29 ENCOUNTER — Other Ambulatory Visit: Payer: Self-pay | Admitting: Family Medicine

## 2021-06-29 MED ORDER — DULOXETINE HCL 60 MG PO CPEP: ORAL_CAPSULE | ORAL | 3 refills | Status: DC

## 2021-06-29 MED ORDER — PREGABALIN 100 MG PO CAPS: 100.0000 mg | ORAL_CAPSULE | Freq: Two times a day (BID) | ORAL | 3 refills | Status: DC

## 2021-06-29 NOTE — Telephone Encounter (Signed)
LOV 05/12/21 ?Last refill 06/26/20, #unkown, unknown refills - historical med, no record of rx from our office ? ?Please review, thanks! ? ?

## 2021-06-30 ENCOUNTER — Other Ambulatory Visit: Payer: Self-pay

## 2021-06-30 ENCOUNTER — Emergency Department: Payer: Medicare Other

## 2021-06-30 ENCOUNTER — Emergency Department
Admission: EM | Admit: 2021-06-30 | Discharge: 2021-06-30 | Disposition: A | Discharge status: Home / Self Care | Payer: Medicare Other | Source: Home / Self Care | Attending: Emergency Medicine | Admitting: Emergency Medicine

## 2021-06-30 ENCOUNTER — Observation Stay
Admission: EM | Admit: 2021-06-30 | Discharge: 2021-07-02 | Disposition: A | Discharge status: Home Health | Payer: Medicare Other | Attending: Emergency Medicine | Admitting: Emergency Medicine

## 2021-06-30 DIAGNOSIS — Z6830 Body mass index (BMI) 30.0-30.9, adult: Secondary | ICD-10-CM | POA: Insufficient documentation

## 2021-06-30 DIAGNOSIS — R0902 Hypoxemia: Secondary | ICD-10-CM | POA: Diagnosis not present

## 2021-06-30 DIAGNOSIS — R1084 Generalized abdominal pain: Secondary | ICD-10-CM | POA: Diagnosis not present

## 2021-06-30 DIAGNOSIS — Z7951 Long term (current) use of inhaled steroids: Secondary | ICD-10-CM | POA: Insufficient documentation

## 2021-06-30 DIAGNOSIS — R109 Unspecified abdominal pain: Secondary | ICD-10-CM | POA: Diagnosis not present

## 2021-06-30 DIAGNOSIS — R197 Diarrhea, unspecified: Secondary | ICD-10-CM

## 2021-06-30 DIAGNOSIS — K529 Noninfective gastroenteritis and colitis, unspecified: Secondary | ICD-10-CM

## 2021-06-30 DIAGNOSIS — Z743 Need for continuous supervision: Secondary | ICD-10-CM | POA: Diagnosis not present

## 2021-06-30 DIAGNOSIS — F32A Depression, unspecified: Secondary | ICD-10-CM | POA: Diagnosis present

## 2021-06-30 DIAGNOSIS — Z23 Encounter for immunization: Secondary | ICD-10-CM | POA: Insufficient documentation

## 2021-06-30 DIAGNOSIS — Z79899 Other long term (current) drug therapy: Secondary | ICD-10-CM | POA: Insufficient documentation

## 2021-06-30 DIAGNOSIS — K219 Gastro-esophageal reflux disease without esophagitis: Secondary | ICD-10-CM | POA: Diagnosis present

## 2021-06-30 DIAGNOSIS — E871 Hypo-osmolality and hyponatremia: Secondary | ICD-10-CM

## 2021-06-30 DIAGNOSIS — N182 Chronic kidney disease, stage 2 (mild): Secondary | ICD-10-CM | POA: Diagnosis present

## 2021-06-30 DIAGNOSIS — I10 Essential (primary) hypertension: Secondary | ICD-10-CM | POA: Diagnosis present

## 2021-06-30 DIAGNOSIS — R531 Weakness: Secondary | ICD-10-CM

## 2021-06-30 DIAGNOSIS — A09 Infectious gastroenteritis and colitis, unspecified: Secondary | ICD-10-CM | POA: Diagnosis present

## 2021-06-30 DIAGNOSIS — J4520 Mild intermittent asthma, uncomplicated: Secondary | ICD-10-CM | POA: Insufficient documentation

## 2021-06-30 DIAGNOSIS — Z20822 Contact with and (suspected) exposure to covid-19: Secondary | ICD-10-CM | POA: Insufficient documentation

## 2021-06-30 DIAGNOSIS — E669 Obesity, unspecified: Secondary | ICD-10-CM | POA: Diagnosis present

## 2021-06-30 DIAGNOSIS — J45909 Unspecified asthma, uncomplicated: Secondary | ICD-10-CM | POA: Diagnosis not present

## 2021-06-30 DIAGNOSIS — D72829 Elevated white blood cell count, unspecified: Secondary | ICD-10-CM | POA: Insufficient documentation

## 2021-06-30 DIAGNOSIS — N179 Acute kidney failure, unspecified: Secondary | ICD-10-CM | POA: Diagnosis not present

## 2021-06-30 LAB — CBC WITH DIFFERENTIAL/PLATELET
Abs Immature Granulocytes: 0.02 10*3/uL (ref 0.00–0.07)
Abs Immature Granulocytes: 0.02 10*3/uL (ref 0.00–0.07)
Basophils Absolute: 0.1 10*3/uL (ref 0.0–0.1)
Basophils Absolute: 0 10*3/uL (ref 0.0–0.1)
Basophils Relative: 0 %
Basophils Relative: 0 %
Eosinophils Absolute: 0 10*3/uL (ref 0.0–0.5)
Eosinophils Absolute: 0 10*3/uL (ref 0.0–0.5)
Eosinophils Relative: 0 %
Eosinophils Relative: 0 %
HCT: 43.6 % (ref 36.0–46.0)
HCT: 36.7 % (ref 36.0–46.0)
Hemoglobin: 14.3 g/dL (ref 12.0–15.0)
Hemoglobin: 12.4 g/dL (ref 12.0–15.0)
Immature Granulocytes: 0 %
Immature Granulocytes: 0 %
Lymphocytes Relative: 10 %
Lymphocytes Relative: 13 %
Lymphs Abs: 1.2 10*3/uL (ref 0.7–4.0)
Lymphs Abs: 1.2 10*3/uL (ref 0.7–4.0)
MCH: 31.5 pg (ref 26.0–34.0)
MCH: 32 pg (ref 26.0–34.0)
MCHC: 32.8 g/dL (ref 30.0–36.0)
MCHC: 33.8 g/dL (ref 30.0–36.0)
MCV: 96 fL (ref 80.0–100.0)
MCV: 94.6 fL (ref 80.0–100.0)
Monocytes Absolute: 1.1 10*3/uL — ABNORMAL HIGH (ref 0.1–1.0)
Monocytes Absolute: 0.9 10*3/uL (ref 0.1–1.0)
Monocytes Relative: 9 %
Monocytes Relative: 10 %
Neutro Abs: 9.3 10*3/uL — ABNORMAL HIGH (ref 1.7–7.7)
Neutro Abs: 6.9 10*3/uL (ref 1.7–7.7)
Neutrophils Relative %: 81 %
Neutrophils Relative %: 77 %
Platelets: 294 10*3/uL (ref 150–400)
Platelets: 247 10*3/uL (ref 150–400)
RBC: 4.54 MIL/uL (ref 3.87–5.11)
RBC: 3.88 MIL/uL (ref 3.87–5.11)
RDW: 14.3 % (ref 11.5–15.5)
RDW: 14.3 % (ref 11.5–15.5)
WBC: 11.6 10*3/uL — ABNORMAL HIGH (ref 4.0–10.5)
WBC: 9.1 10*3/uL (ref 4.0–10.5)
nRBC: 0 % (ref 0.0–0.2)
nRBC: 0 % (ref 0.0–0.2)

## 2021-06-30 LAB — COMPREHENSIVE METABOLIC PANEL
ALT: 22 U/L (ref 0–44)
ALT: 18 U/L (ref 0–44)
AST: 49 U/L — ABNORMAL HIGH (ref 15–41)
AST: 28 U/L (ref 15–41)
Albumin: 3.1 g/dL — ABNORMAL LOW (ref 3.5–5.0)
Albumin: 2.7 g/dL — ABNORMAL LOW (ref 3.5–5.0)
Alkaline Phosphatase: 75 U/L (ref 38–126)
Alkaline Phosphatase: 64 U/L (ref 38–126)
Anion gap: 13 (ref 5–15)
Anion gap: 4 — ABNORMAL LOW (ref 5–15)
BUN: 32 mg/dL — ABNORMAL HIGH (ref 6–20)
BUN: 24 mg/dL — ABNORMAL HIGH (ref 6–20)
CO2: 23 mmol/L (ref 22–32)
CO2: 22 mmol/L (ref 22–32)
Calcium: 9 mg/dL (ref 8.9–10.3)
Calcium: 7.4 mg/dL — ABNORMAL LOW (ref 8.9–10.3)
Chloride: 95 mmol/L — ABNORMAL LOW (ref 98–111)
Chloride: 102 mmol/L (ref 98–111)
Creatinine, Ser: 1.4 mg/dL — ABNORMAL HIGH (ref 0.44–1.00)
Creatinine, Ser: 0.8 mg/dL (ref 0.44–1.00)
GFR, Estimated: 44 mL/min — ABNORMAL LOW (ref >60)
GFR, Estimated: >60 mL/min (ref >60)
Glucose, Bld: 143 mg/dL — ABNORMAL HIGH (ref 70–99)
Glucose, Bld: 101 mg/dL — ABNORMAL HIGH (ref 70–99)
Potassium: 5.4 mmol/L — ABNORMAL HIGH (ref 3.5–5.1)
Potassium: 3.7 mmol/L (ref 3.5–5.1)
Sodium: 131 mmol/L — ABNORMAL LOW (ref 135–145)
Sodium: 128 mmol/L — ABNORMAL LOW (ref 135–145)
Total Bilirubin: 0.5 mg/dL (ref 0.3–1.2)
Total Bilirubin: 0.6 mg/dL (ref 0.3–1.2)
Total Protein: 6.8 g/dL (ref 6.5–8.1)
Total Protein: 6.3 g/dL — ABNORMAL LOW (ref 6.5–8.1)

## 2021-06-30 LAB — GASTROINTESTINAL PANEL BY PCR, STOOL (REPLACES STOOL CULTURE)

## 2021-06-30 LAB — URINALYSIS, ROUTINE W REFLEX MICROSCOPIC
Bilirubin Urine: NEGATIVE
Glucose, UA: NEGATIVE mg/dL
Hgb urine dipstick: NEGATIVE
Ketones, ur: 5 mg/dL — AB
Leukocytes,Ua: NEGATIVE
Nitrite: NEGATIVE
Protein, ur: NEGATIVE mg/dL
Specific Gravity, Urine: 1.019 (ref 1.005–1.030)
pH: 5 (ref 5.0–8.0)

## 2021-06-30 LAB — LIPASE, BLOOD
Lipase: 22 U/L (ref 11–51)
Lipase: 21 U/L (ref 11–51)

## 2021-06-30 LAB — RESP PANEL BY RT-PCR (FLU A&B, COVID) ARPGX2
Influenza A by PCR: NEGATIVE
Influenza A by PCR: NEGATIVE
Influenza B by PCR: NEGATIVE
Influenza B by PCR: NEGATIVE
SARS Coronavirus 2 by RT PCR: NEGATIVE
SARS Coronavirus 2 by RT PCR: NEGATIVE

## 2021-06-30 LAB — C DIFFICILE QUICK SCREEN W PCR REFLEX
C Diff antigen: NEGATIVE
C Diff interpretation: NOT DETECTED
C Diff toxin: NEGATIVE

## 2021-06-30 LAB — MAGNESIUM: Magnesium: 2.9 mg/dL — ABNORMAL HIGH (ref 1.7–2.4)

## 2021-06-30 MED ORDER — AZITHROMYCIN 500 MG PO TABS
1000.0000 mg | ORAL_TABLET | Freq: Once | ORAL | Status: AC
  Administered 2021-06-30: 1000 mg via ORAL
  Filled 2021-06-30: qty 2

## 2021-06-30 MED ORDER — LORATADINE 10 MG PO TABS
10.0000 mg | ORAL_TABLET | Freq: Every evening | ORAL | Status: DC
  Administered 2021-06-30 – 2021-07-01 (×2): 10 mg via ORAL
  Filled 2021-06-30 (×2): qty 1

## 2021-06-30 MED ORDER — ONDANSETRON HCL 4 MG/2ML IJ SOLN
4.0000 mg | Freq: Once | INTRAMUSCULAR | Status: AC
  Administered 2021-06-30: 4 mg via INTRAVENOUS
  Filled 2021-06-30: qty 2

## 2021-06-30 MED ORDER — ESCITALOPRAM OXALATE 10 MG PO TABS
10.0000 mg | ORAL_TABLET | Freq: Every day | ORAL | Status: DC
  Administered 2021-06-30 – 2021-07-02 (×3): 10 mg via ORAL
  Filled 2021-06-30 (×3): qty 1

## 2021-06-30 MED ORDER — SODIUM CHLORIDE 0.9 % IV SOLN: INTRAVENOUS | Status: DC

## 2021-06-30 MED ORDER — ESTRADIOL 1 MG PO TABS
1.0000 mg | ORAL_TABLET | Freq: Every day | ORAL | Status: DC
  Administered 2021-06-30 – 2021-07-01 (×2): 1 mg via ORAL
  Filled 2021-06-30 (×2): qty 1

## 2021-06-30 MED ORDER — HALOPERIDOL 5 MG PO TABS
5.0000 mg | ORAL_TABLET | Freq: Every day | ORAL | Status: DC
  Administered 2021-06-30 – 2021-07-01 (×2): 5 mg via ORAL
  Filled 2021-06-30 (×2): qty 1

## 2021-06-30 MED ORDER — IOHEXOL 300 MG/ML  SOLN
100.0000 mL | Freq: Once | INTRAMUSCULAR | Status: AC | PRN
  Administered 2021-06-30: 100 mL via INTRAVENOUS

## 2021-06-30 MED ORDER — BUSPIRONE HCL 5 MG PO TABS
15.0000 mg | ORAL_TABLET | Freq: Three times a day (TID) | ORAL | Status: DC
  Administered 2021-06-30 – 2021-07-02 (×6): 15 mg via ORAL
  Filled 2021-06-30 (×6): qty 3

## 2021-06-30 MED ORDER — ENOXAPARIN SODIUM 60 MG/0.6ML IJ SOSY
0.5000 mg/kg | PREFILLED_SYRINGE | INTRAMUSCULAR | Status: DC
  Administered 2021-06-30 – 2021-07-01 (×2): 50 mg via SUBCUTANEOUS
  Filled 2021-06-30 (×2): qty 0.6

## 2021-06-30 MED ORDER — ALBUTEROL SULFATE (2.5 MG/3ML) 0.083% IN NEBU: 3.0000 mL | INHALATION_SOLUTION | Freq: Four times a day (QID) | RESPIRATORY_TRACT | Status: DC | PRN

## 2021-06-30 MED ORDER — DULOXETINE HCL 60 MG PO CPEP
60.0000 mg | ORAL_CAPSULE | Freq: Every day | ORAL | Status: DC
  Administered 2021-06-30 – 2021-07-02 (×3): 60 mg via ORAL
  Filled 2021-06-30 (×3): qty 1

## 2021-06-30 MED ORDER — SODIUM CHLORIDE 0.9 % IV BOLUS
1000.0000 mL | Freq: Once | INTRAVENOUS | Status: AC
  Administered 2021-06-30: 1000 mL via INTRAVENOUS

## 2021-06-30 MED ORDER — DIPHENOXYLATE-ATROPINE 2.5-0.025 MG PO TABS
2.0000 | ORAL_TABLET | Freq: Once | ORAL | Status: AC
  Administered 2021-06-30: 2 via ORAL
  Filled 2021-06-30: qty 2

## 2021-06-30 MED ORDER — FENTANYL CITRATE PF 50 MCG/ML IJ SOSY
50.0000 ug | PREFILLED_SYRINGE | Freq: Once | INTRAMUSCULAR | Status: AC
  Administered 2021-06-30: 50 ug via INTRAVENOUS
  Filled 2021-06-30: qty 1

## 2021-06-30 MED ORDER — ONDANSETRON HCL 4 MG/2ML IJ SOLN
4.0000 mg | Freq: Four times a day (QID) | INTRAMUSCULAR | Status: DC | PRN
  Administered 2021-06-30 – 2021-07-01 (×2): 4 mg via INTRAVENOUS
  Filled 2021-06-30 (×2): qty 2

## 2021-06-30 MED ORDER — PANTOPRAZOLE SODIUM 40 MG PO TBEC
40.0000 mg | DELAYED_RELEASE_TABLET | Freq: Two times a day (BID) | ORAL | Status: DC
  Administered 2021-06-30 – 2021-07-02 (×5): 40 mg via ORAL
  Filled 2021-06-30 (×5): qty 1

## 2021-06-30 MED ORDER — ADULT MULTIVITAMIN W/MINERALS CH
1.0000 | ORAL_TABLET | Freq: Every day | ORAL | Status: DC
  Administered 2021-06-30 – 2021-07-02 (×3): 1 via ORAL
  Filled 2021-06-30 (×3): qty 1

## 2021-06-30 MED ORDER — MELOXICAM 7.5 MG PO TABS
15.0000 mg | ORAL_TABLET | Freq: Every day | ORAL | Status: DC
  Administered 2021-06-30 – 2021-07-01 (×2): 15 mg via ORAL
  Filled 2021-06-30 (×2): qty 2

## 2021-06-30 MED ORDER — SUMATRIPTAN SUCCINATE 50 MG PO TABS
50.0000 mg | ORAL_TABLET | ORAL | Status: DC | PRN
  Filled 2021-06-30: qty 1

## 2021-06-30 MED ORDER — SODIUM CHLORIDE 0.9 % IV BOLUS (SEPSIS)
1000.0000 mL | Freq: Once | INTRAVENOUS | Status: AC
  Administered 2021-06-30: 1000 mL via INTRAVENOUS

## 2021-06-30 MED ORDER — LOPERAMIDE HCL 2 MG PO TABS: 2.0000 mg | ORAL_TABLET | Freq: Four times a day (QID) | ORAL | 0 refills | Status: DC | PRN

## 2021-06-30 MED ORDER — FLUTICASONE PROPIONATE 50 MCG/ACT NA SUSP
2.0000 | Freq: Every day | NASAL | Status: DC | PRN
  Filled 2021-06-30: qty 16

## 2021-06-30 MED ORDER — CYCLOBENZAPRINE HCL 10 MG PO TABS: 10.0000 mg | ORAL_TABLET | Freq: Two times a day (BID) | ORAL | Status: DC | PRN

## 2021-06-30 MED ORDER — TRAZODONE HCL 100 MG PO TABS
200.0000 mg | ORAL_TABLET | Freq: Every day | ORAL | Status: DC
  Administered 2021-06-30 – 2021-07-01 (×2): 200 mg via ORAL
  Filled 2021-06-30 (×2): qty 2

## 2021-06-30 MED ORDER — DARIFENACIN HYDROBROMIDE ER 7.5 MG PO TB24
15.0000 mg | ORAL_TABLET | Freq: Every day | ORAL | Status: DC
Start: 2021-06-30 — End: 2021-07-02
  Administered 2021-06-30 – 2021-07-02 (×3): 15 mg via ORAL
  Filled 2021-06-30 (×3): qty 2

## 2021-06-30 MED ORDER — ONDANSETRON HCL 4 MG PO TABS: 4.0000 mg | ORAL_TABLET | Freq: Four times a day (QID) | ORAL | Status: DC | PRN

## 2021-06-30 MED ORDER — OXYCODONE-ACETAMINOPHEN 7.5-325 MG PO TABS
1.0000 | ORAL_TABLET | Freq: Every day | ORAL | Status: DC
  Administered 2021-06-30 – 2021-07-01 (×5): 1 via ORAL
  Filled 2021-06-30 (×5): qty 1

## 2021-06-30 MED ORDER — OYSTER SHELL CALCIUM/D3 500-5 MG-MCG PO TABS
2.0000 | ORAL_TABLET | Freq: Every day | ORAL | Status: DC
  Administered 2021-06-30 – 2021-07-02 (×3): 2 via ORAL
  Filled 2021-06-30 (×4): qty 2

## 2021-06-30 MED ORDER — TOPIRAMATE 25 MG PO TABS
50.0000 mg | ORAL_TABLET | Freq: Two times a day (BID) | ORAL | Status: DC
  Administered 2021-06-30 – 2021-07-02 (×5): 50 mg via ORAL
  Filled 2021-06-30 (×5): qty 2

## 2021-06-30 MED ORDER — PNEUMOCOCCAL 20-VAL CONJ VACC 0.5 ML IM SUSY
0.5000 mL | PREFILLED_SYRINGE | INTRAMUSCULAR | Status: AC
Start: 2021-07-01 — End: 2021-07-02
  Administered 2021-07-02: 0.5 mL via INTRAMUSCULAR
  Filled 2021-06-30 (×2): qty 0.5

## 2021-06-30 NOTE — Assessment & Plan Note (Addendum)
Continue Protonix °

## 2021-06-30 NOTE — Assessment & Plan Note (Addendum)
Complicates overall prognosis and care ?Lifestyle modification and exercise has been discussed with patient.  Patient not motivated ?

## 2021-06-30 NOTE — ED Triage Notes (Signed)
Patient to ER with same complaints of prior. Reports she was recently discharged this morning but feels too unwell to go home. States she is having explosive diarrhea and worsening stomach cramps. Also reports weakness and dry heaving.  ?

## 2021-06-30 NOTE — Assessment & Plan Note (Addendum)
Secondary to volume depletion from GI losses and poor oral intake ?Patient has a baseline serum creatinine of 0.69 and upon presentation it was 1.40 ?Improved with hydration ? ?

## 2021-06-30 NOTE — Assessment & Plan Note (Addendum)
Secondary to volume depletion from GI losses and poor oral intake ?Improved with hydration ?

## 2021-06-30 NOTE — ED Provider Notes (Signed)
? ?[facility] ?Provider Note ? ? ? Event Date/Time  ? First MD Initiated Contact with Patient 06/30/21 0534   ?  (approximate) ? ? ?History  ? ?Diarrhea, Fatigue, and Abdominal Pain ? ? ?HPI ? ?[name] [name] is a 56 y.o. female with history of obesity, bipolar disorder, hypertension, prediabetes who presents to the emergency department with complaints of generalized abdominal pain, diarrhea that started 2 days ago.  No fevers, nausea or vomiting.  States she has recently been on Keflex but is unable to tell me why.  She denies that she has had any previous abdominal surgeries but it looks like she has had a hysterectomy.  She denies dysuria, hematuria, vaginal bleeding or discharge.  No recent hospitalization, travel, sick contact.  Denies bloody stool or melena.  Patient complains of generalized weakness.  No chest pain or shortness of breath. ? ? ?History provided by patient and EMS. ? ? ? ?Past Medical History:  ?Diagnosis Date  ? Anxiety   ? Arthritis   ? Phreesia 02/08/2020  ? Asthma   ? mild intermittent  ? Asthma   ? Phreesia 02/08/2020  ? Bipolar 1 disorder ([place])   ? ect treatments last treatment Sep 02 1011  ? Depression   ? Depression   ? Phreesia 02/08/2020  ? Depression   ? Phreesia 07/10/2020  ? GERD (gastroesophageal reflux disease)   ? Hypertension   ? Pre-diabetes   ? Seizures ([place])   ? last seizure was so long ago she can't remember.   ? Sleep apnea   ? wears CPAP, uncertain of setting  ? Substance abuse ([place])   ? Phreesia 02/08/2020  ? ? ?Past Surgical History:  ?Procedure Laterality Date  ? ABDOMINAL HYSTERECTOMY    ? AMPUTATION TOE Left 02/02/2018  ? Procedure: AMPUTATION TOE Left 4th toe;  Surgeon: [name], DPM;  Location: [place];  Service: Podiatry;  Laterality: Left;  ? APPENDECTOMY N/A   ? Phreesia 02/08/2020  ? BACK SURGERY    ? CARPAL TUNNEL RELEASE    ? x2  ? IRRIGATION AND DEBRIDEMENT ABSCESS Right 10/25/2020  ? Procedure: IRRIGATION AND DEBRIDEMENT  ABSCESS OF FOOT AND APPLICATION OF GRAFT;  Surgeon: [name], DPM;  Location: [place];  Service: Podiatry;  Laterality: Right;  ? Laproscopic knee surgery    ? NECK SURGERY  03/15/2017  ? PLANTAR FASCIA RELEASE    ? x2  ? SPINE SURGERY N/A   ? Phreesia 02/08/2020  ? 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 Procedure: TRANSMETATARSAL AMPUTATION;  Surgeon: [name], DPM;  Location: [facility];  Service: Podiatry;  Laterality: Right;  ? ? ?MEDICATIONS:  ?Prior to Admission medications   ?Medication Sig Start Date End Date Taking? Authorizing Provider  ?AIMOVIG 70 MG/ML SOAJ ADMINISTER 1 ML UNDER THE SKIN EVERY 30 DAYS 06/22/21   [name], MD  ?albuterol [facility] HFA) 108 (90 Base) MCG/ACT inhaler Inhale 2 puffs = 114mg into the lungs every 6 (six) hours as needed for wheezing or shortness of breath. 07/11/20   MEulogio Bear NP  ?busPIRone (BUSPAR) 15 MG tablet Take 15 mg by mouth 3 (three) times daily.  04/26/17   [provider]  ?Calcium Carbonate-Vit D-Min (CALCIUM 1200 PO) Take 1,200 mg by mouth daily.     [provider]  ?cyclobenzaprine (FLEXERIL) 10 MG tablet Take 10 mg by mouth 2 (two) times daily as needed for muscle spasms.     [provider]  ?DULoxetine (CYMBALTA) 60 MG capsule TAKE 1 CAPSULE BY MOUTH EVERY DAY 06/29/21   [name], MD  ?escitalopram (LEXAPRO) 10 MG tablet Take 10 mg by mouth daily. 08/23/19   [provider]  ?estradiol (ESTRACE) 1 MG tablet TAKE 1 TABLET AT BEDTIME ?Patient taking differently: Take 1 mg by mouth at bedtime. 08/22/20   [name], MD  ?famotidine (PEPCID) 40 MG tablet Take 1 tablet (40 mg total) by mouth at bedtime. 05/28/21   [name], MD  ?fluticasone (FLONASE) 50 MCG/ACT nasal spray SPRAY 2 SPRAYS INTRO EACH NOSTRIL EVERY DAY ?Patient taking differently: Place 2 sprays into both nostrils daily as needed for allergies. 12/18/20   [name], MD  ?haloperidol (HALDOL) 5 MG tablet  Take 1 tablet (5 mg total) by mouth at bedtime. For psychosis 11/15/15   Barton Dubois, MD  ?levocetirizine (XYZAL) 5 MG tablet Take 1 tablet (5 mg total) by mouth every evening. 05/13/21   [name], MD  ?Rolan Lipa 290 MCG CAPS capsule TAKE 1 CAPSULE BY MOUTH EVERY MORNING ?Patient taking differently: Take 290 mcg by mouth daily before breakfast. 11/20/20   [name], MD  ?meloxicam (MOBIC) 15 MG tablet Take 15 mg by mouth at bedtime.  09/15/17   [provider]  ?metoprolol succinate (TOPROL-XL) 25 MG 24 hr tablet TAKE 3 TABLETS BY MOUTH EVERY DAY ?Patient taking differently: Take 75 mg by mouth daily. 05/28/21   [name], MD  ?Multiple Vitamins-Minerals (MULTIVITAMIN WITH MINERALS) tablet Take 1 tablet by mouth daily.    [provider]  ?OVER THE COUNTER MEDICATION Take 1 capsule by mouth daily. Fennel Seed    [provider]  ?oxyCODONE-acetaminophen (PERCOCET) 7.5-325 MG tablet Take 1 tablet by mouth 6 (six) times daily.    [provider]  ?pantoprazole (PROTONIX) 40 MG tablet Take 1 tablet (40 mg total) by mouth 2 (two) times daily. 06/19/21   [name], MD  ?potassium chloride SA (KLOR-CON M) 20 MEQ tablet TAKE 2 TABLETS BY MOUTH EVERY DAY ?Patient taking differently: Take 20 mEq by mouth 2 (two) times daily. TAKE 2 TABLETS BY MOUTH EVERY DAY 05/01/21   [name], MD  ?prazosin (MINIPRESS) 2 MG capsule Take 2 mg by mouth at bedtime.    [provider]  ?promethazine (PHENERGAN) 25 MG tablet Take 1 tablet (25 mg total) by mouth every 8 (eight) hours as needed for nausea or vomiting. 03/18/21   [name], DPM  ?solifenacin (VESICARE) 10 MG tablet Take 1 tablet (10 mg total) by mouth daily. 11/17/20   [name], MD  ?SUMAtriptan (IMITREX) 50 MG tablet TAKE 1 TAB EVERY 2 HOURS AS NEEDED FOR MIGRAINE. MAY REPEAT IN 2 HOURS IF PERSISTS OR RECURS ?Patient taking differently: Take 50 mg by mouth every 2 (two) hours as needed  for migraine. 08/30/19   [name], MD  ?topiramate (TOPAMAX) 50 MG tablet TAKE 1 TABLET BY MOUTH TWICE DAILY ?Patient taking differently: Take 50 mg by mouth 2 (two) times daily. 12/19/20   [name], MD  ?traZODone (DESYREL) 100 MG tablet Take 200 mg by mouth at bedtime. 07/29/14   [provider]  ? ? ?Physical Exam  ? ?Triage Vital Signs: ?ED Triage Vitals  ?Enc Vitals Group  ?   BP   ?   Pulse   ?   Resp   ?   Temp   ?  Temp src   ?   SpO2   ?   Weight   ?   Height   ?   Head Circumference   ?   Peak Flow   ?   Pain Score   ?   Pain Loc   ?   Pain Edu?   ?   Excl. in [place]?   ? ? ?Most recent vital signs: ?Vitals:  ? 06/30/21 0624 06/30/21 0712  ?BP:  130/74  ?Pulse: 97 96  ?Resp: 12 13  ?Temp:    ?SpO2: 93% 96%  ? ? ?CONSTITUTIONAL: Alert and oriented and responds appropriately to questions.  Appears uncomfortable but nontoxic, morbidly obese ?HEAD: Normocephalic, atraumatic ?EYES: Conjunctivae clear, pupils appear equal, sclera nonicteric ?ENT: normal nose; moist mucous membranes ?NECK: Supple, normal ROM ?CARD: RRR; S1 and S2 appreciated; no murmurs, no clicks, no rubs, no gallops ?RESP: Normal chest excursion without splinting or tachypnea; breath sounds clear and equal bilaterally; no wheezes, no rhonchi, no rales, no hypoxia or respiratory distress, speaking full sentences ?ABD/GI: Normal bowel sounds; non-distended; tender to palpation throughout the abdomen without guarding or rebound ?BACK: The back appears normal ?EXT: Normal ROM in all joints; no deformity noted, no edema; no cyanosis, status post right transmetatarsal amputation ?SKIN: Normal color for age and race; warm; no rash on exposed skin ?NEURO: Moves all extremities equally, normal speech ?PSYCH: The patient's mood and manner are appropriate. ? ? ?ED Results / Procedures / Treatments  ? ?LABS: ?(all labs ordered are listed, but only abnormal results are displayed) ?Labs Reviewed  ?CBC WITH DIFFERENTIAL/PLATELET -  Abnormal; Notable for the following components:  ?    Result Value  ? WBC 11.6 (*)   ? Neutro Abs 9.3 (*)   ? Monocytes Absolute 1.1 (*)   ? All other components within normal limits  ?GASTROINTESTINAL PANEL BY PCR, S

## 2021-06-30 NOTE — Assessment & Plan Note (Addendum)
Patient presents for evaluation of explosive diarrhea and stool PCR is positive for enteropathogenic E. Coli ?Continue supportive care with IV fluid resuscitation ?Status post 1 dose of azithromycin 1 g x 1 which is the treatment for enteropathogenic E. coli ?

## 2021-06-30 NOTE — Assessment & Plan Note (Addendum)
Secondary to volume depletion related to diarrhea. ?PT, OT recommend HHPT ?

## 2021-06-30 NOTE — H&P (Addendum)
?History and Physical  ? ? ?Patient: [name] [name] PYK:[id number] DOB: 28-Apr-1965 ?DOA: 06/30/2021 ?DOS: the patient was seen and examined on 06/30/2021 ?PCP: [name], MD  ?Patient coming from: Home ? ?Chief Complaint:  ?Chief Complaint  ?Patient presents with  ? Diarrhea  ? ?HPI: [name] [name] is a 56 y.o. female with medical history significant for depression, obesity, GERD, bipolar disorder, hypertension, history of seizures and GERD who presents to the ER for evaluation of weakness and falls. ?Patient states that she has had explosive diarrhea for 3 days associated with chills, diffuse abdominal pain and poor oral intake. ?She was seen in the emergency room overnight and discharged home but according to her she never left because she was too weak and had felt dizzy. ?She denies having any nausea or vomiting and denies having any sick contacts. ?She denies having any urinary symptoms, no headache, no blurred vision, no focal deficit, no leg swelling, no chest pain, no shortness of breath, no cough. ?Her stool panel is positive for enteropathogenic E. coli. ?Review of Systems: As mentioned in the history of present illness. All other systems reviewed and are negative. ?Past Medical History:  ?Diagnosis Date  ? Anxiety   ? Arthritis   ? Phreesia 02/08/2020  ? Asthma   ? mild intermittent  ? Asthma   ? Phreesia 02/08/2020  ? Bipolar 1 disorder ([place])   ? ect treatments last treatment Sep 02 1011  ? Depression   ? Depression   ? Phreesia 02/08/2020  ? Depression   ? Phreesia 07/10/2020  ? GERD (gastroesophageal reflux disease)   ? Hypertension   ? Pre-diabetes   ? Seizures ([place])   ? last seizure was so long ago she can't remember.   ? Sleep apnea   ? wears CPAP, uncertain of setting  ? Substance abuse ([place])   ? Phreesia 02/08/2020  ? ?Past Surgical History:  ?Procedure Laterality Date  ? ABDOMINAL HYSTERECTOMY    ? AMPUTATION TOE Left 02/02/2018  ? Procedure: AMPUTATION TOE Left 4th toe;  Surgeon:  [name], DPM;  Location: [place];  Service: Podiatry;  Laterality: Left;  ? APPENDECTOMY N/A   ? Phreesia 02/08/2020  ? BACK SURGERY    ? CARPAL TUNNEL RELEASE    ? x2  ? IRRIGATION AND DEBRIDEMENT ABSCESS Right 10/25/2020  ? Procedure: IRRIGATION AND DEBRIDEMENT ABSCESS OF FOOT AND APPLICATION OF GRAFT;  Surgeon: [name], DPM;  Location: [place];  Service: Podiatry;  Laterality: Right;  ? Laproscopic knee surgery    ? NECK SURGERY  03/15/2017  ? PLANTAR FASCIA RELEASE    ? x2  ? SPINE SURGERY N/A   ? Phreesia 02/08/2020  ? 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 Procedure: TRANSMETATARSAL AMPUTATION;  Surgeon: [name], DPM;  [facility];  Service: Podiatry;  Laterality: Right;  ? ?Social History:  reports that she has never smoked. She has never used smokeless tobacco. She reports that she does not drink alcohol and does not use drugs. ? ?Allergies  ?Allergen Reactions  ? Other   ? Penicillins Hives and Rash  ?  No anaphylactic or severe cutaneous Sx ?> 10 yr ago, no additional medical attention required ?Tolerates Keflex and Rocephin  ? Tetracyclines & Related Other (See Comments)  ?  Syncope and put her "in a coma"  ? Tramadol Other (See Comments)  ?  Seizures  ? Phenazopyridine Other (See Comments)  ?  Unknown  ? Tetracycline Other (  See Comments)  ?  Syncope and "Put me in a coma"  ? Ciprofloxacin Rash and Itching  ? Codeine Itching and Rash  ? Estradiol Rash  ?  Patches broke out the skin  ? ? ?Family History  ?Problem Relation Age of Onset  ? Diabetes Mother   ? COPD Mother   ? Hypertension Mother   ? Hyperlipidemia Mother   ? Heart disease Father   ? Hyperlipidemia Father   ? Hypertension Father   ? Cancer Father   ? Heart disease Brother   ? Heart disease Daughter   ? Heart disease Maternal Grandmother   ? Hypertension Maternal Grandmother   ? Heart disease Maternal Grandfather   ? Kidney cancer Paternal Grandmother   ? Heart disease Paternal Grandfather   ? ? ?Prior to  Admission medications   ?Medication Sig Start Date End Date Taking? Authorizing Provider  ?AIMOVIG 70 MG/ML SOAJ ADMINISTER 1 ML UNDER THE SKIN EVERY 30 DAYS 06/22/21   [name], MD  ?albuterol [facility] HFA) 108 (90 Base) MCG/ACT inhaler Inhale 2 puffs = 161mg into the lungs every 6 (six) hours as needed for wheezing or shortness of breath. 07/11/20   MEulogio Bear NP  ?busPIRone (BUSPAR) 15 MG tablet Take 15 mg by mouth 3 (three) times daily.  04/26/17   [provider]  ?Calcium Carbonate-Vit D-Min (CALCIUM 1200 PO) Take 1,200 mg by mouth daily.     [provider]  ?cyclobenzaprine (FLEXERIL) 10 MG tablet Take 10 mg by mouth 2 (two) times daily as needed for muscle spasms.     [provider]  ?DULoxetine (CYMBALTA) 60 MG capsule TAKE 1 CAPSULE BY MOUTH EVERY DAY 06/29/21   PSusy Frizzle MD  ?escitalopram (LEXAPRO) 10 MG tablet Take 10 mg by mouth daily. 08/23/19   [provider]  ?estradiol (ESTRACE) 1 MG tablet TAKE 1 TABLET AT BEDTIME ?Patient taking differently: Take 1 mg by mouth at bedtime. 08/22/20   PSusy Frizzle MD  ?famotidine (PEPCID) 40 MG tablet Take 1 tablet (40 mg total) by mouth at bedtime. 05/28/21   PSusy Frizzle MD  ?fluticasone (FLONASE) 50 MCG/ACT nasal spray SPRAY 2 SPRAYS INTRO EACH NOSTRIL EVERY DAY ?Patient taking differently: Place 2 sprays into both nostrils daily as needed for allergies. 12/18/20   PSusy Frizzle MD  ?haloperidol (HALDOL) 5 MG tablet Take 1 tablet (5 mg total) by mouth at bedtime. For psychosis 11/15/15   MBarton Dubois MD  ?levocetirizine (XYZAL) 5 MG tablet Take 1 tablet (5 mg total) by mouth every evening. 05/13/21   PSusy Frizzle MD  ?LRolan Lipa290 MCG CAPS capsule TAKE 1 CAPSULE BY MOUTH EVERY MORNING ?Patient taking differently: Take 290 mcg by mouth daily before breakfast. 11/20/20   PSusy Frizzle MD  ?loperamide (IMODIUM A-D) 2 MG tablet Take 1 tablet (2 mg total) by mouth 4 (four) times daily as  needed for diarrhea or loose stools. 06/30/21   BNaaman Plummer MD  ?meloxicam (MOBIC) 15 MG tablet Take 15 mg by mouth at bedtime.  09/15/17   [provider]  ?metoprolol succinate (TOPROL-XL) 25 MG 24 hr tablet TAKE 3 TABLETS BY MOUTH EVERY DAY ?Patient taking differently: Take 75 mg by mouth daily. 05/28/21   BLorretta Harp MD  ?Multiple Vitamins-Minerals (MULTIVITAMIN WITH MINERALS) tablet Take 1 tablet by mouth daily.    [provider]  ?OVER THE COUNTER MEDICATION Take 1 capsule by mouth daily. Fennel Seed  [provider]  ?oxyCODONE-acetaminophen (PERCOCET) 7.5-325 MG tablet Take 1 tablet by mouth 6 (six) times daily.    [provider]  ?pantoprazole (PROTONIX) 40 MG tablet Take 1 tablet (40 mg total) by mouth 2 (two) times daily. 06/19/21   [name], MD  ?potassium chloride SA (KLOR-CON M) 20 MEQ tablet TAKE 2 TABLETS BY MOUTH EVERY DAY ?Patient taking differently: Take 20 mEq by mouth 2 (two) times daily. TAKE 2 TABLETS BY MOUTH EVERY DAY 05/01/21   [name], MD  ?prazosin (MINIPRESS) 2 MG capsule Take 2 mg by mouth at bedtime.    [provider]  ?promethazine (PHENERGAN) 25 MG tablet Take 1 tablet (25 mg total) by mouth every 8 (eight) hours as needed for nausea or vomiting. 03/18/21   [name], DPM  ?solifenacin (VESICARE) 10 MG tablet Take 1 tablet (10 mg total) by mouth daily. 11/17/20   [name], MD  ?SUMAtriptan (IMITREX) 50 MG tablet TAKE 1 TAB EVERY 2 HOURS AS NEEDED FOR MIGRAINE. MAY REPEAT IN 2 HOURS IF PERSISTS OR RECURS ?Patient taking differently: Take 50 mg by mouth every 2 (two) hours as needed for migraine. 08/30/19   [name], MD  ?topiramate (TOPAMAX) 50 MG tablet TAKE 1 TABLET BY MOUTH TWICE DAILY ?Patient taking differently: Take 50 mg by mouth 2 (two) times daily. 12/19/20   [name], MD  ?traZODone (DESYREL) 100 MG tablet Take 200 mg by mouth at bedtime. 07/29/14   [provider]  ? ? ?Physical Exam: ?Vitals:  ? 06/30/21 1208 06/30/21 1330 06/30/21 1400 06/30/21 1430  ?BP: (!) 124/112 105/65 (!) 105/59 107/67  ?Pulse: 95 91 85 87  ?Resp: (!) '25 20 20 20  '$ ?Temp: (!) 97.5 ?F (36.4 ?C)

## 2021-06-30 NOTE — ED Provider Notes (Signed)
? ?[facility] ?Provider Note ? ? ? Event Date/Time  ? First MD Initiated Contact with Patient 06/30/21 1240   ?  (approximate) ? ? ?History  ? ?Diarrhea ? ? ?HPI ? ?[name] [name] is a 56 y.o. female with history of bipolar, obesity, hypertension, and seizures presents emergency department with continued diarrhea.  Patient was seen here overnight and had been discharged this morning.  States she got home is just too weak to take care of herself.  Still continuing to have explosive diarrhea.  Patient is denying any new onset of abdominal pain.  States everything is the same other than she feels a little weaker than she did earlier this morning.  No fever or chills.  No one else at home is sick. ? ?  ? ? ?Physical Exam  ? ?Triage Vital Signs: ?ED Triage Vitals  ?Enc Vitals Group  ?   BP 06/30/21 1208 (!) 124/112  ?   Pulse Rate 06/30/21 1208 95  ?   Resp 06/30/21 1208 (!) 25  ?   Temp 06/30/21 1208 (!) 97.5 ?F (36.4 ?C)  ?   Temp Source 06/30/21 1208 Oral  ?   SpO2 06/30/21 1208 98 %  ?   Weight 06/30/21 1206 220 lb 0.3 oz (99.8 kg)  ?   Height 06/30/21 1206 '5\' 4"'$  (1.626 m)  ?   Head Circumference --   ?   Peak Flow --   ?   Pain Score --   ?   Pain Loc --   ?   Pain Edu? --   ?   Excl. in [place]? --   ? ? ?Most recent vital signs: ?Vitals:  ? 06/30/21 1400 06/30/21 1430  ?BP: (!) 105/59 107/67  ?Pulse: 85 87  ?Resp: 20 20  ?Temp:    ?SpO2: 99% 100%  ? ? ? ?General: Awake, no distress.   ?CV:  Good peripheral perfusion. regular rate and  rhythm ?Resp:  Normal effort. Lungs CTA ?Abd:  No distention.  Bowel sounds normal all 4 quads, abdomen nontender ?Other:    ? ? ?ED Results / Procedures / Treatments  ? ?Labs ?(all labs ordered are listed, but only abnormal results are displayed) ?Labs Reviewed  ?COMPREHENSIVE METABOLIC PANEL - Abnormal; Notable for the following components:  ?    Result Value  ? Sodium 128 (*)   ? Glucose, Bld 101 (*)   ? BUN 24 (*)   ? Calcium 7.4 (*)   ? Total Protein 6.3 (*)    ? Albumin 2.7 (*)   ? Anion gap 4 (*)   ? All other components within normal limits  ?RESP PANEL BY RT-PCR (FLU A&B, COVID) [id number]  ?LIPASE, BLOOD  ?CBC WITH DIFFERENTIAL/PLATELET  ? ? ? ?EKG ? ? ? ? ?RADIOLOGY ? ? ? ? ?PROCEDURES: ? ? ?Procedures ? ? ?MEDICATIONS ORDERED IN ED: ?Medications  ?sodium chloride 0.9 % bolus 1,000 mL (1,000 mLs Intravenous New Bag/Given 06/30/21 1351)  ?ondansetron (ZOFRAN) injection 4 mg (4 mg Intravenous Given 06/30/21 1350)  ?diphenoxylate-atropine (LOMOTIL) 2.5-0.025 MG per tablet 2 tablet (2 tablets Oral Given 06/30/21 1351)  ? ? ? ?IMPRESSION / MDM / ASSESSMENT AND PLAN / ED COURSE  ?I reviewed the triage vital signs and the nursing notes. ?             ?               ? ?Differential diagnosis includes, but is not  limited to, gastroenteritis, food poisoning, colitis, weakness, electrolyte imbalance ? ?Patient's labs at time of discharge this morning have potassium of 5.4 so we will repeat her labs today.  We will give her a trial of Lomotil to see if this stops the diarrhea.  Hopefully if the diarrhea is discontinued and she will be feeling better to be able to be discharged. ? ?Patient's labs are showing hyponatremia, this may be contributing to her feeling of weakness.  CBC is normal.  In discussion with the patient I do not feel that she can go home as she is stating she can barely stand up on her own due to the weakness.  Lomotil has helped stop the diarrhea.  We will have her admitted most likely to observation to replenish her sodium level.  Patient is in agreement with being admitted. ? ?Consult hospitalist ? ?Dr. Francine Graven will be admitting the patient observation.  She is in stable condition at this time. ? ? ? ? ?  ? ? ?FINAL CLINICAL IMPRESSION(S) / ED DIAGNOSES  ? ?Final diagnoses:  ?Hyponatremia  ?Weakness  ?Diarrhea in adult patient  ? ? ? ?Rx / DC Orders  ? ?ED Discharge Orders   ? ? None  ? ?  ? ? ? ?Note:  This document was prepared using Dragon voice recognition  software and may include unintentional dictation errors. ? ?  ?Versie Starks, PA-C ?06/30/21 1515 ? ?  ?Naaman Plummer, MD ?07/01/21 1351 ? ?

## 2021-06-30 NOTE — Assessment & Plan Note (Addendum)
Continue buspirone, duloxetine, Lexapro and trazodone ?

## 2021-06-30 NOTE — Assessment & Plan Note (Addendum)
Hold metoprolol for now due to relative hypotension.  Blood pressure stable ?

## 2021-06-30 NOTE — ED Triage Notes (Signed)
Pt brought in from home to ED for c/o generalized abd pain associated w/ explosive diarrhea x2days and generalized weakness. Denies n/v/abx use.  Pt presents to ED AAOx4, respi even-unlabored. Soft, tender abd  ?

## 2021-06-30 NOTE — ED Notes (Signed)
Pt taken to CT at this time.

## 2021-06-30 NOTE — ED Notes (Signed)
MD [place] notified of micro result of "innerpathogen e-coli" on GI pannel ?

## 2021-07-01 DIAGNOSIS — A09 Infectious gastroenteritis and colitis, unspecified: Secondary | ICD-10-CM | POA: Diagnosis not present

## 2021-07-01 DIAGNOSIS — E871 Hypo-osmolality and hyponatremia: Secondary | ICD-10-CM | POA: Diagnosis not present

## 2021-07-01 DIAGNOSIS — K219 Gastro-esophageal reflux disease without esophagitis: Secondary | ICD-10-CM

## 2021-07-01 DIAGNOSIS — N179 Acute kidney failure, unspecified: Secondary | ICD-10-CM

## 2021-07-01 DIAGNOSIS — R531 Weakness: Secondary | ICD-10-CM | POA: Diagnosis not present

## 2021-07-01 DIAGNOSIS — K529 Noninfective gastroenteritis and colitis, unspecified: Secondary | ICD-10-CM | POA: Diagnosis not present

## 2021-07-01 DIAGNOSIS — R197 Diarrhea, unspecified: Secondary | ICD-10-CM | POA: Diagnosis not present

## 2021-07-01 LAB — CBC
HCT: 32 % — ABNORMAL LOW (ref 36.0–46.0)
Hemoglobin: 10.4 g/dL — ABNORMAL LOW (ref 12.0–15.0)
MCH: 31.5 pg (ref 26.0–34.0)
MCHC: 32.5 g/dL (ref 30.0–36.0)
MCV: 97 fL (ref 80.0–100.0)
Platelets: 239 10*3/uL (ref 150–400)
RBC: 3.3 MIL/uL — ABNORMAL LOW (ref 3.87–5.11)
RDW: 14.9 % (ref 11.5–15.5)
WBC: 7.1 10*3/uL (ref 4.0–10.5)
nRBC: 0 % (ref 0.0–0.2)

## 2021-07-01 LAB — BASIC METABOLIC PANEL
Anion gap: 2 — ABNORMAL LOW (ref 5–15)
BUN: 13 mg/dL (ref 6–20)
CO2: 24 mmol/L (ref 22–32)
Calcium: 7.6 mg/dL — ABNORMAL LOW (ref 8.9–10.3)
Chloride: 112 mmol/L — ABNORMAL HIGH (ref 98–111)
Creatinine, Ser: 0.5 mg/dL (ref 0.44–1.00)
GFR, Estimated: >60 mL/min (ref >60)
Glucose, Bld: 95 mg/dL (ref 70–99)
Potassium: 4.5 mmol/L (ref 3.5–5.1)
Sodium: 138 mmol/L (ref 135–145)

## 2021-07-01 MED ORDER — OXYCODONE-ACETAMINOPHEN 7.5-325 MG PO TABS
1.0000 | ORAL_TABLET | ORAL | Status: DC | PRN
  Administered 2021-07-01 – 2021-07-02 (×4): 1 via ORAL
  Filled 2021-07-01 (×4): qty 1

## 2021-07-01 NOTE — Evaluation (Signed)
Occupational Therapy Evaluation ?Patient Details ?Name: [name] [name] ?MRN: [id number] ?DOB: 1965-10-31 ?Today's Date: 07/01/2021 ? ? ?History of Present Illness [name] [name] is a 56 y.o. female with medical history significant for depression, obesity, GERD, bipolar disorder, hypertension, history of seizures and GERD who presents to the ER for evaluation of weakness and falls.  Patient states that she has had explosive diarrhea for 3 days associated with chills, diffuse abdominal pain and poor oral intake.  ? ?Clinical Impression ?  ?[name] [name] was seen for OT evaluation this date. Prior to hospital admission, pt was independent with ADL management. She endorses bathing and dressing herself without assist or AE. She uses a [employer] or RW for functional mobility and endorses at least 3 falls in the last 6 months. Pt also endorses hx of shoulder injury with plans for total shoulder replacement in the next month. Pt currently lives with her son in a 1 level home with 3 STE, but may also stay with her mother upon DC home from the hospital. Currently pt demonstrates impairments as described below (See OT problem list) which functionally limit her ability to perform ADL/self-care tasks. Pt currently requires SUPERVISION for functional mobility and LB ADL management.  Pt would benefit from skilled OT services to address noted impairments and functional limitations (see below for any additional details) in order to maximize safety and independence while minimizing falls risk and caregiver burden. Do not anticipate the need for follow-up OT services upon acute hospital DC.    ? ?Recommendations for follow up therapy are one component of a multi-disciplinary discharge planning process, led by the attending physician.  Recommendations may be updated based on patient status, additional functional criteria and insurance authorization.  ? ?Follow Up Recommendations ? No OT follow up  ?  ?Assistance Recommended at  Discharge Intermittent Supervision/Assistance  ?Patient can return home with the following A little help with bathing/dressing/bathroom ? ?  ?Functional Status Assessment ? Patient has had a recent decline in their functional status and demonstrates the ability to make significant improvements in function in a reasonable and predictable amount of time.  ?Equipment Recommendations ? None recommended by OT  ?  ?Recommendations for Other Services   ? ? ?  ?Precautions / Restrictions Precautions ?Precautions: Fall ?Restrictions ?Weight Bearing Restrictions: No  ? ?  ? ?Mobility Bed Mobility ?Overal bed mobility: Modified Independent ?  ?  ?  ?  ?  ?  ?General bed mobility comments: no assist given to return to supine ?  ? ?Transfers ?Overall transfer level: Modified independent ?Equipment used:  (IV pole) ?Transfers: Sit to/from Stand ?Sit to Stand: Supervision ?  ?  ?  ?  ?  ?General transfer comment: received standing in Bathroom. Was able to stand from low bed with supervision ?  ? ?  ?Balance Overall balance assessment: Modified Independent, History of Falls ?  ?  ?  ?  ?  ?  ?  ?  ?  ?  ?  ?  ?  ?  ?  ?  ?  ?  ?   ? ?ADL either performed or assessed with clinical judgement  ? ?ADL Overall ADL's : Needs assistance/impaired ?  ?  ?  ?  ?  ?  ?  ?  ?  ?  ?  ?  ?  ?  ?  ?  ?  ?  ?  ?General ADL Comments: Pt at or near baseline level of functional performance,  but requires increased time/effort to perform and intermittent supervision for exertional tasks 2/2 generalized weakness.  ? ? ? ?Vision Baseline Vision/History: 1 Wears glasses ?Ability to See in Adequate Light: 1 Impaired ?Patient Visual Report: No change from baseline ?   ?   ?Perception   ?  ?Praxis   ?  ? ?Pertinent Vitals/Pain Pain Assessment ?Pain Assessment: 0-10 ?Pain Score: 6  ?Pain Location: Chronic pain in shoulder, back, knees ?Pain Descriptors / Indicators: Aching, Discomfort ?Pain Intervention(s): Monitored during session, Repositioned  ? ? ? ?Hand  Dominance Right ?  ?Extremity/Trunk Assessment Upper Extremity Assessment ?Upper Extremity Assessment: Generalized weakness;RUE deficits/detail ?RUE Deficits / Details: Pt endorses hx of R shoulder injury with plans for shoulder replacement later this year. Limited active shoulder flexion beyond 90 with increased pain. ?  ?Lower Extremity Assessment ?Lower Extremity Assessment: Generalized weakness ?  ?Cervical / Trunk Assessment ?Cervical / Trunk Assessment: Normal ?  ?Communication Communication ?Communication: No difficulties ?  ?Cognition Arousal/Alertness: Awake/alert ?Behavior During Therapy: [facility] for tasks assessed/performed, Flat affect ?Overall Cognitive Status: Within Functional Limits for tasks assessed ?  ?  ?  ?  ?  ?  ?  ?  ?  ?  ?  ?  ?  ?  ?  ?  ?  ?  ?  ?General Comments    ? ?  ?Exercises   ?  ?Shoulder Instructions    ? ? ?Home Living Family/patient expects to be discharged to:: Private residence ?Living Arrangements: Other relatives ?Available Help at Discharge: Family;Available 24 hours/day ?Type of Home: House ?Home Access: Level entry ?  ?  ?Home Layout: One level ?  ?  ?Bathroom Shower/Tub: Walk-in shower ?  ?Bathroom Toilet: Standard ?  ?  ?Home Equipment: Conservation officer, nature (2 wheels);Cane - single point ?  ?Additional Comments: Staying at sons: 1 level home, 2-3 STE. ?  ? ?  ?Prior Functioning/Environment Prior Level of Function : Independent/Modified Independent ?  ?  ?  ?  ?  ?  ?Mobility Comments: Uses [employer], or RW, 3 falls in last 6 months. Pt endorses getting dizzy and losing her balance ?ADLs Comments: Pt reports she is independent/MOD I for BADL management. Family assists with IADL tasks including cooking, cleaning, driving.Hx of R shoulder injury with plan for replacement on April 6. ?  ? ?  ?  ?OT Problem List: Decreased strength;Pain;Decreased range of motion;Decreased activity tolerance;Decreased safety awareness;Decreased knowledge of use of DME or AE;Impaired UE functional  use ?  ?   ?OT Treatment/Interventions: Self-care/ADL training;Therapeutic exercise;Therapeutic activities;DME and/or AE instruction;Patient/family education;Balance training;Energy conservation  ?  ?OT Goals(Current goals can be found in the care plan section) Acute Rehab OT Goals ?Patient Stated Goal: To get stronger ?OT Goal Formulation: With patient ?Time For Goal Achievement: 07/15/21 ?Potential to Achieve Goals: Good  ?OT Frequency: Min 2X/week ?  ? ?Co-evaluation PT/OT/SLP Co-Evaluation/Treatment: Yes ?Reason for Co-Treatment: Necessary to address cognition/behavior during functional activity;To address functional/ADL transfers ?  ?  ?  ? ?  ?AM-PAC OT "6 Clicks" Daily Activity     ?Outcome Measure Help from another person eating meals?: None ?Help from another person taking care of personal grooming?: None ?Help from another person toileting, which includes using toliet, bedpan, or urinal?: A Little ?Help from another person bathing (including washing, rinsing, drying)?: A Little ?Help from another person to put on and taking off regular upper body clothing?: A Little ?Help from another person to put on and taking off regular  lower body clothing?: A Little ?6 Click Score: 20 ?  ?End of Session   ? ?Activity Tolerance: Patient tolerated treatment well ?Patient left: in bed;with call bell/phone within reach;with bed alarm set ? ?OT Visit Diagnosis: Other abnormalities of gait and mobility (R26.89);History of falling (Z91.81)  ?              ?Time: 1308-6578 ?OT Time Calculation (min): 11 min ?Charges:  OT General Charges ?$OT Visit: 1 Visit ?OT Evaluation ?$OT Eval Low Complexity: 1 Low ? ?Shara Blazing, M.S., OTR/L ?[facility] Team - [facility] ?Ascom: [phone] ?07/01/21, 1:52 PM ? ?

## 2021-07-01 NOTE — Progress Notes (Addendum)
?  Progress Note ? ? ?Patient: [name] [name]:[id number] DOB: 08/27/1965 DOA: 06/30/2021     0 ?DOS: the patient was seen and examined on 07/01/2021 ?  ?Brief hospital course: ?56 y.o. female with medical history significant for depression, obesity, GERD, bipolar disorder, hypertension, history of seizures and GERD admitted for weakness and falls. ?Patient states that she has had explosive diarrhea for 3 days associated with chills, diffuse abdominal pain and poor oral intake. ?She was seen in the emergency room overnight and discharged home but according to her she never left because she was too weak and had felt dizzy. ? ?3/15 -PT, OT eval.  Advance diet.  Treated for enteropathogenic E. coli found in the stool ? ? ?Assessment and Plan: ?* Gastroenteritis, infectious ?Patient presents for evaluation of explosive diarrhea and stool PCR is positive for enteropathogenic E. Coli ?Continue supportive care with IV fluid resuscitation ?Status post 1 dose of azithromycin 1 g x 1 which is the treatment for enteropathogenic E. coli ? ?Hyponatremia ?Secondary to volume depletion from GI losses and poor oral intake ?Improved with hydration ? ?AKI (acute kidney injury) ([place]) ?Secondary to volume depletion from GI losses and poor oral intake ?Patient has a baseline serum creatinine of 0.69 and upon presentation it was 1.40 ?Improved with hydration ? ? ?Weakness ?Secondary to volume depletion related to diarrhea. ?PT, OT recommend HHPT ? ?Essential hypertension ?Hold metoprolol for now due to relative hypotension.  Blood pressure stable ? ?Depression ?Continue buspirone, duloxetine, Lexapro and trazodone ? ?GERD (gastroesophageal reflux disease) ?Continue Protonix ? ?Obesity (BMI 30-39.9) ?Complicates overall prognosis and care ?Lifestyle modification and exercise has been discussed with patient.  Patient not motivated ? ? ? ? ?  ? ?Subjective: Shares that she is feeling very weak and sick although not able to describe it any  further.  She denies any fever or diarrhea.  She is requesting clots as she is does not have her home clothes anymore.  She is not willing to go today as she has not eaten anything yet and is afraid due to having some abdominal pain and possibly diarrhea ? ?Physical Exam: ?Vitals:  ? 07/01/21 0432 07/01/21 0736 07/01/21 1147 07/01/21 1328  ?BP: 109/71 115/74 121/75   ?Pulse: 73 74 78   ?Resp: 14 14    ?Temp: 97.9 ?F (36.6 ?C) (!) 97.5 ?F (36.4 ?C)    ?TempSrc:  Oral    ?SpO2: 97% 98% 97% 100%  ?Weight:      ?Height:      ? ?56 year old obese female lying in the bed in mild distress/pain ?Eyes-pupils equal round reactive to light and accommodation ?Lungs-clear to auscultation bilaterally, no wheezing rales rhonchi crepitation ?Cardiovascular-S1-S2 normal, no murmur rales or gallop ?Abdomen-soft, benign ?Neuro alert, oriented, nonfocal ?Skin- no rash or lesion ?Psych-normal mood and affect ? ?Data Reviewed: ? ?Low albumin of 2.7 ? ?Family Communication: None at bedside ? ?Disposition: ?Status is: Observation ?The patient remains OBS appropriate and will d/c before 2 midnights. ? Planned Discharge Destination: Home with Home Health ? ? ?Hoping for discharge tomorrow if she can tolerate diet ? ?DVT prophylaxis-Lovenox ? ?Time spent: 35 minutes ? ?Author: ?Max Sane, MD ?07/01/2021 1:46 PM ? ?For on call review [email or web address].  ?

## 2021-07-01 NOTE — Hospital Course (Signed)
56 y.o. female with medical history significant for depression, obesity, GERD, bipolar disorder, hypertension, history of seizures and GERD admitted for weakness and falls. ?Patient states that she has had explosive diarrhea for 3 days associated with chills, diffuse abdominal pain and poor oral intake. ?She was seen in the emergency room overnight and discharged home but according to her she never left because she was too weak and had felt dizzy. ? ?3/15 -PT, OT eval.  Advance diet.  Treated for enteropathogenic E. coli found in the stool ?

## 2021-07-01 NOTE — TOC Initial Note (Signed)
Transition of Care (TOC) - Initial/Assessment Note  ? ? ?Patient Details  ?Name: [name] [name] ?MRN: [id number] ?Date of Birth: 1966-01-01 ? ?Transition of Care (TOC) CM/SW Contact:    ?Pete Pelt, RN ?Phone Number: ?07/01/2021, 4:13 PM ? ?Clinical Narrative:     Patient does not live alone and has assistance from others after discharge.  No concerns about transportation or medications.  Patient states she is up to date with all of her appointments, including PCP ? ?Home Health PT recommended, [facility] will accept patient as per Corene Cornea from agency.  No DME recommendations. ? ?Patient states she feels safe and comfortable returning home at discharge and denies other TOC needs at this time.  TOC contact information provided to patient.            ? ? ?Expected Discharge Plan: [place] ?Barriers to Discharge: Continued Medical Work up ? ? ?Patient Goals and CMS Choice ?  ?  ?Choice offered to / list presented to : NA ? ?Expected Discharge Plan and Services ?Expected Discharge Plan: [place] ?  ?Discharge Planning Services: CM Consult ?Post Acute Care Choice: Home Health ?Living arrangements for the past 2 months: [place] ?                ?  ?  ?  ?  ?  ?HH Arranged: PT ?[place] Agency: [place] ([place]) ?Date HH Agency Contacted: 07/01/21 ?Time [place]: [id number] ?Representative spoke with at [place]: Corene Cornea ? ?Prior Living Arrangements/Services ?Living arrangements for the past 2 months: [place] ?Lives with:: Relatives ?Patient language and need for interpreter reviewed:: Yes (No interpreter required) ?Do you feel safe going back to the place where you live?: Yes      ?Need for Family Participation in Patient Care: Yes (Comment) ?Care giver support system in place?: Yes (comment) ?  ?Criminal Activity/Legal Involvement Pertinent to Current Situation/Hospitalization: No - Comment as needed ? ?Activities of Daily Living ?Home Assistive  Devices/Equipment: Cane (specify quad or straight), Walker (specify type) ?ADL Screening (condition at time of admission) ?Patient's cognitive ability adequate to safely complete daily activities?: Yes ?Is the patient deaf or have difficulty hearing?: No ?Does the patient have difficulty seeing, even when wearing glasses/contacts?: No ?Does the patient have difficulty concentrating, remembering, or making decisions?: No ?Patient able to express need for assistance with ADLs?: Yes ?Does the patient have difficulty dressing or bathing?: No ?Independently performs ADLs?: Yes (appropriate for developmental age) ?Does the patient have difficulty walking or climbing stairs?: Yes ?Weakness of Legs: Both ?Weakness of Arms/Hands: Both ? ?Permission Sought/Granted ?Permission sought to share information with : Case Manager ?Permission granted to share information with : Yes, Verbal Permission Granted ?   ? Permission granted to share info w AGENCY: [facility] ?   ?   ? ?Emotional Assessment ?Appearance:: Appears stated age ?Attitude/Demeanor/Rapport: Gracious, Engaged ?Affect (typically observed): Appropriate, Pleasant ?Orientation: : Oriented to Self, Oriented to Place, Oriented to  Time, Oriented to Situation ?Alcohol / Substance Use: Not Applicable ?Psych Involvement: No (comment) ? ?Admission diagnosis:  Hyponatremia [E87.1] ?Weakness [R53.1] ?Diarrhea in adult patient [R19.7] ?Patient Active Problem List  ? Diagnosis Date Noted  ? Weakness 06/30/2021  ? Gastroenteritis, infectious 06/30/2021  ? AKI (acute kidney injury) ([place]) 06/30/2021  ? Hyponatremia 06/30/2021  ? Open wound of right foot, initial encounter 10/21/2020  ? Finger ulcer ([place]) 10/21/2020  ? Syncope 08/26/2019  ?  Non-healing open wound of toe 05/15/2019  ? Osteomyelitis ([place]) 05/15/2019  ? Palpitations 10/26/2018  ? Foot ulcer ([place]) 01/31/2018  ? Hyperglycemia 01/31/2018  ? Left leg cellulitis 09/24/2017  ? Syncope and collapse   ? Near syncope  11/23/2016  ? Abdominal pain 09/23/2016  ? Anxiety 09/23/2016  ? 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[place]) 09/23/2016  ? Constipation 09/23/2016  ? DDD (degenerative disc disease), lumbosacral 09/23/2016  ? Migraines 09/23/2016  ? Myelopathy ([place]) 09/23/2016  ? Nausea 09/23/2016  ? Post laminectomy syndrome 09/23/2016  ? Pseudoarthrosis of lumbar spine 09/23/2016  ? Shortness of breath 09/23/2016  ? Sleep apnea 09/23/2016  ? Spinal stenosis of lumbar region 09/23/2016  ? Vertigo 09/23/2016  ? Hallux malleus 11/15/2015  ? Chronic pain syndrome   ? GERD (gastroesophageal reflux disease)   ? Depression   ? Rash 11/14/2015  ? Toe ulcer, right ([place]) 04/11/2015  ? Cellulitis of toe of right foot 04/11/2015  ? Nonspecific chest pain   ? Chest pain 10/26/2014  ? Obesity (BMI 30-39.9) 10/26/2014  ? Hypokalemia 10/26/2014  ? Manic bipolar I disorder ([place]) 10/26/2014  ? Atypical angina ([place]) 10/26/2014  ? Tardive dyskinesia 08/26/2011  ? Manic bipolar I disorder with rapid cycling ([place]) 05/18/2011  ?  Class: Acute  ? Asthma 02/24/2011  ? Essential hypertension 02/24/2011  ? History of migraine headaches 02/24/2011  ? ?PCP:  [name], MD ?Pharmacy:   ?[facility], [address] ?[address] ?[place] [address] ?Phone: [phone] Fax: [phone] ? ?[facility] - [place], [place] - [address] ?[address] ?[place] [address] ?Phone: [phone] Fax: [phone] ? ?[id number] [address]?[place]?Phone: [phone]: [phone]? ?[id number] [place] [address]Phone: [phone]: [phone] ? ? ? ?Social Determinants of Health (SDOH) Interventions ?  ? ?Readmission Risk Interventions ?No flowsheet data found. ? ? ?

## 2021-07-01 NOTE — Evaluation (Addendum)
Physical Therapy Evaluation ?Patient Details ?Name: [name] [name] ?MRN: [id number] ?DOB: 1965/06/14 ?Today's Date: 07/01/2021 ? ?History of Present Illness ? [name] [name] is a 56 y.o. female with medical history significant for depression, obesity, GERD, bipolar disorder, hypertension, history of seizures and GERD who presents to the ER for evaluation of weakness and falls.  Patient states that she has had explosive diarrhea for 3 days associated with chills, diffuse abdominal pain and poor oral intake.  ?Clinical Impression ? Patient received in bathroom standing at sink with call bell on. She requires min guard/supervision for ambulating back to bed with IV pole. Supervision for sit to stand from bed and no assist needed for bed mobility. Patient ambulating at slow labored pace due to fatigue and reported chronic pain. She will continue to benefit from skilled PT while here to improve strength and endurance.      ?   ? ?Recommendations for follow up therapy are one component of a multi-disciplinary discharge planning process, led by the attending physician.  Recommendations may be updated based on patient status, additional functional criteria and insurance authorization. ? ?Follow Up Recommendations Home health PT ? ?  ?Assistance Recommended at Discharge Intermittent Supervision/Assistance  ?Patient can return home with the following ? A little help with walking and/or transfers;Help with stairs or ramp for entrance;Assist for transportation;Assistance with cooking/housework;A little help with bathing/dressing/bathroom ? ?  ?Equipment Recommendations None recommended by PT  ?Recommendations for Other Services ?    ?  ?Functional Status Assessment Patient has had a recent decline in their functional status and demonstrates the ability to make significant improvements in function in a reasonable and predictable amount of time.  ? ?  ?Precautions / Restrictions Precautions ?Precautions:  Fall ?Restrictions ?Weight Bearing Restrictions: No  ? ?  ? ?Mobility ? Bed Mobility ?Overal bed mobility: Modified Independent ?  ?  ?  ?  ?  ?  ?General bed mobility comments: no assist given to return to supine ?  ? ?Transfers ?Overall transfer level: Modified independent ?Equipment used: None ?Transfers: Sit to/from Stand ?Sit to Stand: Supervision ?  ?  ?  ?  ?  ?General transfer comment: received standing in Bathroom. Was able to stand from low bed with supervision ?  ? ?Ambulation/Gait ?Ambulation/Gait assistance: Min guard ?Gait Distance (Feet): 20 Feet ?Assistive device: IV Pole ?Gait Pattern/deviations: Step-through pattern, Decreased step length - right, Decreased step length - left, Shuffle ?Gait velocity: decr ?  ?  ?General Gait Details: patient with labored gait. Reporting fatigue. Slow pace. Min guard. ? ?Stairs ?  ?  ?  ?  ?  ? ?Wheelchair Mobility ?  ? ?Modified Rankin (Stroke Patients Only) ?  ? ?  ? ?Balance Overall balance assessment: Modified Independent, History of Falls ?  ?  ?  ?  ?  ?  ?  ?  ?  ?  ?  ?  ?  ?  ?  ?  ?  ?  ?   ? ? ? ?Pertinent Vitals/Pain Pain Assessment ?Pain Assessment: 0-10 ?Pain Score: 6  ?Pain Location: Chronic pain in shoulder, back, knees ?Pain Descriptors / Indicators: Aching, Discomfort ?Pain Intervention(s): Monitored during session, Repositioned  ? ? ?Home Living Family/patient expects to be discharged to:: Private residence ?Living Arrangements: Other relatives ?Available Help at Discharge: Family;Available 24 hours/day ?Type of Home: House ?Home Access: Level entry ?  ?  ?  ?Home Layout: One level ?Home Equipment: Conservation officer, nature (2 wheels);Cane - single  point ?Additional Comments: Staying at sons: 1 level home, 2-3 STE.  ?  ?Prior Function Prior Level of Function : Independent/Modified Independent ?  ?  ?  ?  ?  ?  ?Mobility Comments: Uses [employer], or RW, 3 falls in last 6 months. Pt endorses getting dizzy and losing her balance ?ADLs Comments: Pt reports she  is independent/MOD I for BADL management. Family assists with IADL tasks including cooking, cleaning, driving.Hx of R shoulder injury with plan for replacement on April 6. ?  ? ? ?Hand Dominance  ? Dominant Hand: Right ? ?  ?Extremity/Trunk Assessment  ? Upper Extremity Assessment ?Upper Extremity Assessment: Defer to OT evaluation ?  ? ?Lower Extremity Assessment ?Lower Extremity Assessment: Generalized weakness ?  ? ?Cervical / Trunk Assessment ?Cervical / Trunk Assessment: Normal  ?Communication  ? Communication: No difficulties  ?Cognition Arousal/Alertness: Awake/alert ?Behavior During Therapy: [employer] for tasks assessed/performed ?Overall Cognitive Status: Within Functional Limits for tasks assessed ?  ?  ?  ?  ?  ?  ?  ?  ?  ?  ?  ?  ?  ?  ?  ?  ?  ?  ?  ? ?  ?General Comments   ? ?  ?Exercises    ? ?Assessment/Plan  ?  ?PT Assessment Patient needs continued PT services  ?PT Problem List Decreased strength;Decreased activity tolerance;Decreased mobility;Pain;Decreased balance ? ?   ?  ?PT Treatment Interventions Therapeutic exercise;DME instruction;Gait training;Stair training;Functional mobility training;Therapeutic activities;Patient/family education   ? ?PT Goals (Current goals can be found in the Care Plan section)  ?Acute Rehab PT Goals ?Patient Stated Goal: to feel better ?PT Goal Formulation: With patient ?Time For Goal Achievement: 07/08/21 ?Potential to Achieve Goals: Good ? ?  ?Frequency Min 2X/week ?  ? ? ?Co-evaluation   ?  ?  ?  ?  ? ? ?  ?AM-PAC PT "6 Clicks" Mobility  ?Outcome Measure Help needed turning from your back to your side while in a flat bed without using bedrails?: None ?Help needed moving from lying on your back to sitting on the side of a flat bed without using bedrails?: None ?Help needed moving to and from a bed to a chair (including a wheelchair)?: A Little ?Help needed standing up from a chair using your arms (e.g., wheelchair or bedside chair)?: A Little ?Help needed to walk in  hospital room?: A Little ?Help needed climbing 3-5 steps with a railing? : A Lot ?6 Click Score: 19 ? ?  ?End of Session   ?Activity Tolerance: Patient limited by fatigue ?Patient left: in bed;with call bell/phone within reach;with bed alarm set ?Nurse Communication: Mobility status ?PT Visit Diagnosis: Muscle weakness (generalized) (M62.81);Difficulty in walking, not elsewhere classified (R26.2);History of falling (Z91.81);Pain ?Pain - Right/Left:  (B) ?Pain - part of body: Shoulder;Knee (back) ?  ? ?Time: 4818-5909 ?PT Time Calculation (min) (ACUTE ONLY): 12 min ? ? ?Charges:   PT Evaluation ?$PT Eval Low Complexity: 1 Low ?  ?  ?   ? ? ?Amanda Cockayne, PT, GCS ?07/01/21,1:38 PM ? ?

## 2021-07-02 DIAGNOSIS — R197 Diarrhea, unspecified: Secondary | ICD-10-CM

## 2021-07-02 DIAGNOSIS — K219 Gastro-esophageal reflux disease without esophagitis: Secondary | ICD-10-CM | POA: Diagnosis not present

## 2021-07-02 DIAGNOSIS — R531 Weakness: Secondary | ICD-10-CM | POA: Diagnosis not present

## 2021-07-02 DIAGNOSIS — E871 Hypo-osmolality and hyponatremia: Secondary | ICD-10-CM | POA: Diagnosis not present

## 2021-07-02 DIAGNOSIS — A09 Infectious gastroenteritis and colitis, unspecified: Secondary | ICD-10-CM | POA: Diagnosis not present

## 2021-07-02 DIAGNOSIS — K529 Noninfective gastroenteritis and colitis, unspecified: Secondary | ICD-10-CM | POA: Diagnosis not present

## 2021-07-02 NOTE — Progress Notes (Signed)
Pt being discharged home, discharge instructions reviewed with pt, states understanding, pt assisted via wheelchair per her request to medical mall entrance awaiting taxi arrival ?

## 2021-07-03 ENCOUNTER — Telehealth: Payer: Self-pay

## 2021-07-03 ENCOUNTER — Other Ambulatory Visit: Payer: Self-pay | Admitting: Family Medicine

## 2021-07-03 MED ORDER — ONDANSETRON HCL 4 MG PO TABS: 4.0000 mg | ORAL_TABLET | Freq: Three times a day (TID) | ORAL | 0 refills | Status: DC | PRN

## 2021-07-03 NOTE — Telephone Encounter (Signed)
Patient advised.

## 2021-07-03 NOTE — Telephone Encounter (Signed)
Transition Care Management Follow-up Telephone Call ?Date of discharge and from where: 07/02/21 [facility] ?How have you been since you were released from the hospital? Pt states she is doing a little better. ?Any questions or concerns? No ? ?Items Reviewed: ?Did the pt receive and understand the discharge instructions provided? Yes  ?Medications obtained and verified? Yes  ?Other? No  ?Any new allergies since your discharge? No  ?Dietary orders reviewed? Yes ?Do you have support at home? Yes  ? ?Home Care and Equipment/Supplies: ?Were home health services ordered? not applicable ?If so, what is the name of the agency? N/A  ?Has the agency set up a time to come to the patient's home? not applicable ?Were any new equipment or medical supplies ordered?  No ?What is the name of the medical supply agency? N/A ?Were you able to get the supplies/equipment? not applicable ?Do you have any questions related to the use of the equipment or supplies? No ? ?Functional Questionnaire: (I = Independent and D = Dependent) ?ADLs: I ? ?Bathing/Dressing- I ? ?Meal Prep- I ? ?Eating- I ? ?Maintaining continence- I ? ?Transferring/Ambulation- I ? ?Managing Meds- I ? ?Follow up appointments reviewed: ? ?PCP Hospital f/u appt confirmed? Yes  Scheduled to see Dr. Dennard Schaumann on 07/10/21 @ 2. ?[facility] f/u appt confirmed?  Have not contacted patient yet to follow up with Dr. [name]  Are transportation arrangements needed? No  ?If their condition worsens, is the pt aware to call PCP or go to the Emergency Dept.? Yes ?Was the patient provided with contact information for the PCP's office or ED? Yes ?Was to pt encouraged to call back with questions or concerns? Yes ? ?

## 2021-07-03 NOTE — Discharge Summary (Signed)
?Physician Discharge Summary ?  ?Patient: [name] [name] MRN: [id number] DOB: [birth year]  ?Admit date:     06/30/2021  ?Discharge date: 07/02/2021  ?Discharge Physician: Max Sane  ? ?PCP: [name], MD  ? ?Recommendations at discharge:  ? ?Follow-up with outpatient provider as requested ? ?Discharge Diagnoses: ?Principal Problem: ?  Gastroenteritis, infectious ?Active Problems: ?  Obesity (BMI 30-39.9) ?  GERD (gastroesophageal reflux disease) ?  Depression ?  Essential hypertension ?  Weakness ?  AKI (acute kidney injury) ([place]) ?  Hyponatremia ?  Diarrhea in adult patient ? ?Hospital Course: ?56 y.o. female with medical history significant for depression, obesity, GERD, bipolar disorder, hypertension, history of seizures and GERD admitted for weakness and falls. ?Patient states that she has had explosive diarrhea for 3 days associated with chills, diffuse abdominal pain and poor oral intake. ?She was seen in the emergency room overnight and discharged home but according to her she never left because she was too weak and had felt dizzy. ? ?3/15 -PT, OT eval.  Advance diet.  Treated for enteropathogenic E. coli found in the stool ? ?Assessment and Plan: ?* Gastroenteritis, infectious ?Patient presents for evaluation of explosive diarrhea and stool PCR is positive for enteropathogenic E. Coli ?- supportive care with IV fluid resuscitation ?Treated with 1 dose of azithromycin 1 g x 1 with good response.  Patient has had not had any further diarrhea and tolerating diet ? ?Hyponatremia ?Secondary to volume depletion from GI losses and poor oral intake ?Improved with hydration ? ?AKI (acute kidney injury) ([place]) ?Secondary to volume depletion from GI losses and poor oral intake ?Patient has a baseline serum creatinine of 0.69 and upon presentation it was 1.40 ?Improved with hydration ? ?Weakness ?Secondary to volume depletion related to diarrhea. ?PT, OT recommend HHPT which was set up by TOC ? ?Essential  hypertension ?Metoprolol held while in the hospital resume at discharge ? ?Depression ?Continue buspirone, duloxetine, Lexapro and trazodone ? ?GERD (gastroesophageal reflux disease) ?Continue Protonix ? ?Obesity (BMI 30-39.9) ?Complicates overall prognosis and care ?Lifestyle modification and exercise has been discussed with patient.  Patient not motivated ? ? ? ? ?  ? ? ?Disposition: Home health ?Diet recommendation:  ?Discharge Diet Orders (From admission, onward)  ? ?  Start     Ordered  ? 07/02/21 0000  Diet - low sodium heart healthy       ? 07/02/21 5732  ? ?  ?  ? ?  ? ?Carb modified diet ?DISCHARGE MEDICATION: ?Allergies as of 07/02/2021   ? ?   Reactions  ? Other   ? Penicillins Hives, Rash  ? No anaphylactic or severe cutaneous Sx ?> 10 yr ago, no additional medical attention required ?Tolerates Keflex and Rocephin  ? Tetracyclines & Related Other (See Comments)  ? Syncope and put her "in a coma"  ? Tramadol Other (See Comments)  ? Seizures  ? Phenazopyridine Other (See Comments)  ? Unknown  ? Tetracycline Other (See Comments)  ? Syncope and "Put me in a coma"  ? Ciprofloxacin Rash, Itching  ? Codeine Itching, Rash  ? Estradiol Rash  ? Patches broke out the skin  ? ?  ? ?  ?Medication List  ?  ? ?TAKE these medications   ? ?Aimovig 70 MG/ML Soaj ?Generic drug: Erenumab-aooe ?ADMINISTER 1 ML UNDER THE SKIN EVERY 30 DAYS ?  ?albuterol 108 (90 Base) MCG/ACT inhaler ?Commonly known as: ProAir HFA ?Inhale 2 puffs = 121mg into the lungs every 6 (  six) hours as needed for wheezing or shortness of breath. ?  ?busPIRone 15 MG tablet ?Commonly known as: BUSPAR ?Take 15 mg by mouth 3 (three) times daily. ?  ?CALCIUM 1200 PO ?Take 1,200 mg by mouth daily. ?  ?cyclobenzaprine 10 MG tablet ?Commonly known as: FLEXERIL ?Take 10 mg by mouth 2 (two) times daily as needed for muscle spasms. ?  ?DULoxetine 60 MG capsule ?Commonly known as: CYMBALTA ?TAKE 1 CAPSULE BY MOUTH EVERY DAY ?  ?escitalopram 10 MG tablet ?Commonly  known as: LEXAPRO ?Take 10 mg by mouth daily. ?  ?estradiol 1 MG tablet ?Commonly known as: ESTRACE ?TAKE 1 TABLET AT BEDTIME ?  ?famotidine 40 MG tablet ?Commonly known as: PEPCID ?Take 1 tablet (40 mg total) by mouth at bedtime. ?  ?fluticasone 50 MCG/ACT nasal spray ?Commonly known as: FLONASE ?SPRAY 2 SPRAYS INTRO EACH NOSTRIL EVERY DAY ?What changed: See the new instructions. ?  ?haloperidol 5 MG tablet ?Commonly known as: HALDOL ?Take 1 tablet (5 mg total) by mouth at bedtime. For psychosis ?  ?levocetirizine 5 MG tablet ?Commonly known as: XYZAL ?Take 1 tablet (5 mg total) by mouth every evening. ?  ?Linzess 290 MCG Caps capsule ?Generic drug: linaclotide ?TAKE 1 CAPSULE BY MOUTH EVERY MORNING ?What changed:  ?how much to take ?when to take this ?  ?loperamide 2 MG tablet ?Commonly known as: IMODIUM A-D ?Take 1 tablet (2 mg total) by mouth 4 (four) times daily as needed for diarrhea or loose stools. ?  ?meloxicam 15 MG tablet ?Commonly known as: MOBIC ?Take 15 mg by mouth at bedtime. ?  ?metoprolol succinate 25 MG 24 hr tablet ?Commonly known as: TOPROL-XL ?TAKE 3 TABLETS BY MOUTH EVERY DAY ?What changed:  ?how much to take ?how to take this ?when to take this ?additional instructions ?  ?multivitamin with minerals tablet ?Take 1 tablet by mouth daily. ?  ?OVER THE COUNTER MEDICATION ?Take 1 capsule by mouth daily. Fennel Seed ?  ?oxyCODONE-acetaminophen 7.5-325 MG tablet ?Commonly known as: PERCOCET ?Take 1 tablet by mouth 6 (six) times daily. ?  ?pantoprazole 40 MG tablet ?Commonly known as: PROTONIX ?Take 1 tablet (40 mg total) by mouth 2 (two) times daily. ?  ?potassium chloride SA 20 MEQ tablet ?Commonly known as: KLOR-CON M ?TAKE 2 TABLETS BY MOUTH EVERY DAY ?What changed:  ?how much to take ?how to take this ?when to take this ?  ?prazosin 2 MG capsule ?Commonly known as: MINIPRESS ?Take 2 mg by mouth at bedtime. ?  ?promethazine 25 MG tablet ?Commonly known as: PHENERGAN ?Take 1 tablet (25 mg total)  by mouth every 8 (eight) hours as needed for nausea or vomiting. ?  ?solifenacin 10 MG tablet ?Commonly known as: VESICARE ?Take 1 tablet (10 mg total) by mouth daily. ?  ?SUMAtriptan 50 MG tablet ?Commonly known as: IMITREX ?TAKE 1 TAB EVERY 2 HOURS AS NEEDED FOR MIGRAINE. MAY REPEAT IN 2 HOURS IF PERSISTS OR RECURS ?What changed: See the new instructions. ?  ?topiramate 50 MG tablet ?Commonly known as: TOPAMAX ?TAKE 1 TABLET BY MOUTH TWICE DAILY ?  ?traZODone 100 MG tablet ?Commonly known as: DESYREL ?Take 200 mg by mouth at bedtime. ?  ? ?  ? ? Follow-up Information   ? ? [name], MD. Schedule an appointment as soon as possible for a visit in 1 week(s).   ?Specialty: Family Medicine ?Why: [facility] Discharge F/UP ?Contact information: ?[address] [address] ?[phone] ? ? ?  ?  ? ?  [name], MD. Schedule an appointment as soon as possible for a visit in 2 week(s).   ?Specialties: Cardiology, Radiology ?Why: [facility] Discharge F/UP ?Contact information: ?[address] ?[address] ?[place] [address] ?[phone] ? ? ?  ?  ? ?  ?  ? ?  ? ?Discharge Exam: ?Filed Weights  ? 06/30/21 1206  ?Weight: 99.25 kg  ? ?56 year old obese female lying in the bed in mild distress/pain ?Eyes-pupils equal round reactive to light and accommodation ?Lungs-clear to auscultation bilaterally, no wheezing rales rhonchi crepitation ?Cardiovascular-S1-S2 normal, no murmur rales or gallop ?Abdomen-soft, benign ?Neuro alert, oriented, nonfocal ?Skin- no rash or lesion ?Psych-normal mood and affect ? ?Condition at discharge: good ? ?The results of significant diagnostics from this hospitalization (including imaging, microbiology, ancillary and laboratory) are listed below for reference.  ? ?Imaging Studies: ?CT Head Wo Contrast ? ?Result Date: 06/09/2021 ?CLINICAL DATA:  Headache and pressure behind the eyes. EXAM: CT HEAD WITHOUT CONTRAST TECHNIQUE: Contiguous axial images were  obtained from the base of the skull through the vertex without intravenous contrast. RADIATION DOSE REDUCTION: This exam was performed according to the departmental dose-optimization program which includes automated

## 2021-07-03 NOTE — Telephone Encounter (Signed)
Patient called to report nausea. States she was recently discharged from the hospital. She would like to know what you can prescribe to help with her nausea. ? ?Please advise, thanks! ?

## 2021-07-07 ENCOUNTER — Other Ambulatory Visit: Payer: Self-pay | Admitting: Family Medicine

## 2021-07-08 ENCOUNTER — Other Ambulatory Visit: Payer: Self-pay

## 2021-07-09 DIAGNOSIS — L97519 Non-pressure chronic ulcer of other part of right foot with unspecified severity: Secondary | ICD-10-CM | POA: Diagnosis not present

## 2021-07-09 DIAGNOSIS — G894 Chronic pain syndrome: Secondary | ICD-10-CM | POA: Diagnosis not present

## 2021-07-09 DIAGNOSIS — M961 Postlaminectomy syndrome, not elsewhere classified: Secondary | ICD-10-CM | POA: Diagnosis not present

## 2021-07-10 ENCOUNTER — Inpatient Hospital Stay: Payer: Medicare Other | Admitting: Family Medicine

## 2021-07-13 ENCOUNTER — Other Ambulatory Visit: Payer: Self-pay

## 2021-07-13 ENCOUNTER — Telehealth: Payer: Self-pay | Admitting: Podiatry

## 2021-07-13 ENCOUNTER — Ambulatory Visit (INDEPENDENT_AMBULATORY_CARE_PROVIDER_SITE_OTHER): Payer: Medicare Other

## 2021-07-13 ENCOUNTER — Ambulatory Visit (INDEPENDENT_AMBULATORY_CARE_PROVIDER_SITE_OTHER): Payer: Medicare Other | Admitting: Podiatry

## 2021-07-13 ENCOUNTER — Other Ambulatory Visit: Payer: Self-pay | Admitting: Podiatry

## 2021-07-13 DIAGNOSIS — Z9889 Other specified postprocedural states: Secondary | ICD-10-CM

## 2021-07-13 DIAGNOSIS — L97512 Non-pressure chronic ulcer of other part of right foot with fat layer exposed: Secondary | ICD-10-CM | POA: Diagnosis not present

## 2021-07-13 DIAGNOSIS — L97511 Non-pressure chronic ulcer of other part of right foot limited to breakdown of skin: Secondary | ICD-10-CM

## 2021-07-13 DIAGNOSIS — L03031 Cellulitis of right toe: Secondary | ICD-10-CM | POA: Diagnosis not present

## 2021-07-13 DIAGNOSIS — L02611 Cutaneous abscess of right foot: Secondary | ICD-10-CM | POA: Diagnosis not present

## 2021-07-13 MED ORDER — CEPHALEXIN 500 MG PO CAPS: 500.0000 mg | ORAL_CAPSULE | Freq: Four times a day (QID) | ORAL | 0 refills | Status: DC

## 2021-07-13 NOTE — Telephone Encounter (Signed)
Pt is requesting an appointment for a ulcer hole in her Right foot. Dr. Leigh Aurora scheduled is full. Can we get them worked in this week? ? ?Please advise ?

## 2021-07-15 ENCOUNTER — Telehealth: Payer: Self-pay | Admitting: *Deleted

## 2021-07-15 NOTE — Telephone Encounter (Signed)
Called [facility] and they wanted to know what type of wound culture , is it the aerobic anaerobic w/ gram stain?They are needing an answer today, culture is only valid thru today(48 hrs). Please advise. ?

## 2021-07-15 NOTE — Telephone Encounter (Signed)
[facility] says that they cannot do a pathology review of a smear, originally sent. Was it supposed to be a culture? They wanted to clarify the specimen sent on 07/13/21. Please advise. ?

## 2021-07-15 NOTE — Telephone Encounter (Signed)
Ok, will call

## 2021-07-15 NOTE — Telephone Encounter (Signed)
Called [facility] and gave approval per Dr Jacqualyn Posey to change order to the aerobic anaerobic w/ gram stain wound culture.spoke with Bethanne Ginger , verbalized understanding, culture successfully submitted. ?

## 2021-07-15 NOTE — Progress Notes (Signed)
Subjective: ?[name] [name] is a 56 y.o. is seen today in office for ulceration of her right foot.  She is on Thursday she noticed a small spot and then it got worse and worsened today she presents today for evaluation.  She has some bloody drainage but no purulence.  Denies any increase in swelling to the foot or redness of the foot.  No fevers or chills that she reports.  No other concerns. ? ?Objective: ?General: No acute distress, AAOx3  ?DP/PT pulses palpable 2/4, CRT < 3 sec to all digits.  ?Right foot: On the area the previous wound is granular ulcer present with hyperkeratotic periwound.  After debridement of the wound today it measures 0.8 x 0.5 x 1 cm.  There is no probing to bone although does get close.  There is no purulence noted.  There is mild surrounding edema and erythema without any ascending cellulitis.  No fluctuation or crepitation.  There is no moderate.  Prior to debridement the wound was smaller measuring 0.5 x 0.4 x 0.5 cm.  There is fibrotic, granular tissue present in the base of the wound that was debrided. ?No pain with calf compression, swelling, warmth, erythema.  ? ?Assessment and Plan:  ?Ulceration right foot ? ?-Treatment options discussed including all alternatives, risks, and complications ?-X-rays obtained and reviewed of the right foot.  3 views were obtained.  No evidence of acute fracture, osteomyelitis.  No soft tissue edema.  Transmetatarsal amputation noted. ?-Sharply debrided the wound utilizing a #312 with scalpel to debride nonviable devitalized tissue to help limit wound healing.  I debrided the wound through the subcutaneous tissue to the fat layer.  There was minimal bleeding and hemostasis achieved through manual compression.  I irrigated the wound with saline.  Silvadene was applied followed by dressing.  Tolerated well. ?-Continue daily dressing changes at home.  I will order a silver alginate dressing today through [facility]. ?-CAM boot, nonweightbearing,  elevation. ?-Prescribed Keflex ?-Wound culture obtained ?-Recommend to hold off on shoulder surgery at this point. ? ?[name] DPM ? ?

## 2021-07-16 DIAGNOSIS — L97511 Non-pressure chronic ulcer of other part of right foot limited to breakdown of skin: Secondary | ICD-10-CM | POA: Diagnosis not present

## 2021-07-17 ENCOUNTER — Ambulatory Visit (INDEPENDENT_AMBULATORY_CARE_PROVIDER_SITE_OTHER): Payer: Medicare Other | Admitting: Family Medicine

## 2021-07-17 VITALS — BP 122/76 | HR 74 | Temp 97.4°F | Ht 64.0 in | Wt 234.2 lb

## 2021-07-17 DIAGNOSIS — A09 Infectious gastroenteritis and colitis, unspecified: Secondary | ICD-10-CM

## 2021-07-17 NOTE — Progress Notes (Signed)
? ?Subjective:  ? ? Patient ID: [name] [name], female    DOB: 1965/11/27, 56 y.o.   MRN: [id number] ? ?HPI ?Admit date:     06/30/2021  ?Discharge date: 07/02/2021  ?Discharge Physician: Max Sane  ?  ?PCP: [name], MD  ?  ?Recommendations at discharge:  ?  ?Follow-up with outpatient provider as requested ?  ?Discharge Diagnoses: ?Principal Problem: ?  Gastroenteritis, infectious ?Active Problems: ?  Obesity (BMI 30-39.9) ?  GERD (gastroesophageal reflux disease) ?  Depression ?  Essential hypertension ?  Weakness ?  AKI (acute kidney injury) ([place]) ?  Hyponatremia ?  Diarrhea in adult patient ?  ?Hospital Course: ?56 y.o. female with medical history significant for depression, obesity, GERD, bipolar disorder, hypertension, history of seizures and GERD admitted for weakness and falls. ?Patient states that she has had explosive diarrhea for 3 days associated with chills, diffuse abdominal pain and poor oral intake. ?She was seen in the emergency room overnight and discharged home but according to her she never left because she was too weak and had felt dizzy. ? ?Assessment and Plan: ?* Gastroenteritis, infectious ?Patient presents for evaluation of explosive diarrhea and stool PCR is positive for enteropathogenic E. Coli ?- supportive care with IV fluid resuscitation ?Treated with 1 dose of azithromycin 1 g x 1 with good response.  Patient has had not had any further diarrhea and tolerating diet ?  ?Hyponatremia ?Secondary to volume depletion from GI losses and poor oral intake ?Improved with hydration ?  ?AKI (acute kidney injury) ([place]) ?Secondary to volume depletion from GI losses and poor oral intake ?Patient has a baseline serum creatinine of 0.69 and upon presentation it was 1.40 ?Improved with hydration ?  ?Weakness ?Secondary to volume depletion related to diarrhea. ?PT, OT recommend HHPT which was set up by TOC ?  ?Essential hypertension ?Metoprolol held while in the hospital resume at discharge ?   ?Depression ?Continue buspirone, duloxetine, Lexapro and trazodone ?  ?GERD (gastroesophageal reflux disease) ?Continue Protonix ?  ?Obesity (BMI 30-39.9) ?Complicates overall prognosis and care ?Lifestyle modification and exercise has been discussed with patient.  Patient not motivated ? ?07/17/21 ?Patient states that since discharge, her back feels like it is moving slower.  She states it is hard for her to concentrate.  It is hard for her to understand what people are asking her.  She feels like she is in a fog.  This is gradually getting better.  She is on numerous centrally acting medications including Haldol, trazodone, Flexeril, Vesicare.  She states that she has to have the Haldol at night.  Without that, she feels that her mental health deteriorates rapidly.  She needs trazodone to help her sleep.  She needs Flexeril for muscle pain.  She denies any additional fever or chills.  She denies any abdominal pain.  The diarrhea has resolved.  In the hospital her sodium was quite low at 128 and her calcium was also low. ?  ?  ?Past Medical History:  ?Diagnosis Date  ? Anxiety   ? Arthritis   ? Phreesia 02/08/2020  ? Asthma   ? mild intermittent  ? Asthma   ? [employer]reesia 02/08/2020  ? Bipolar 1 disorder ([place])   ? ect treatments last treatment Sep 02 1011  ? Depression   ? Depression   ? [employer]reesia 02/08/2020  ? Depression   ? [employer]reesia 07/10/2020  ? GERD (gastroesophageal reflux disease)   ? Hypertension   ? Pre-diabetes   ?  Seizures ([place])   ? last seizure was so long ago she can't remember.   ? Sleep apnea   ? wears CPAP, uncertain of setting  ? Substance abuse ([place])   ? Phreesia 02/08/2020  ? ?Past Surgical History:  ?Procedure Laterality Date  ? ABDOMINAL HYSTERECTOMY    ? AMPUTATION TOE Left 02/02/2018  ? Procedure: AMPUTATION TOE Left 4th toe;  Surgeon: [name], DPM;  Location: [facility];  Service: Podiatry;  Laterality: Left;  ? APPENDECTOMY N/A   ? [employer]reesia 02/08/2020  ? BACK SURGERY    ? CARPAL TUNNEL  RELEASE    ? x2  ? IRRIGATION AND DEBRIDEMENT ABSCESS Right 10/25/2020  ? Procedure: IRRIGATION AND DEBRIDEMENT ABSCESS OF FOOT AND APPLICATION OF GRAFT;  Surgeon: [name], DPM;  Location: [facility];  Service: Podiatry;  Laterality: Right;  ? Laproscopic knee surgery    ? NECK SURGERY  03/15/2017  ? PLANTAR FASCIA RELEASE    ? x2  ? SPINE SURGERY N/A   ? [employer]reesia 02/08/2020  ? 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 Procedure: TRANSMETATARSAL AMPUTATION;  Surgeon: [name], DPM;  Location: [facility];  Service: Podiatry;  Laterality: Right;  ? ?Current Outpatient Medications on File Prior to Visit  ?Medication Sig Dispense Refill  ? AIMOVIG 70 MG/ML SOAJ ADMINISTER 1 ML UNDER THE SKIN EVERY 30 DAYS 1 mL 0  ? albuterol (PROAIR HFA) 108 (90 Base) MCG/ACT inhaler Inhale 2 puffs = 181mg into the lungs every 6 (six) hours as needed for wheezing or shortness of breath. 8.5 g 3  ? busPIRone (BUSPAR) 15 MG tablet Take 15 mg by mouth 3 (three) times daily.   2  ? Calcium Carbonate-Vit D-Min (CALCIUM 1200 PO) Take 1,200 mg by mouth daily.     ? cephALEXin (KEFLEX) 500 MG capsule Take 1 capsule (500 mg total) by mouth 4 (four) times daily. 40 capsule 0  ? cyclobenzaprine (FLEXERIL) 10 MG tablet Take 10 mg by mouth 2 (two) times daily as needed for muscle spasms.     ? DULoxetine (CYMBALTA) 60 MG capsule TAKE 1 CAPSULE BY MOUTH EVERY DAY 90 capsule 3  ? escitalopram (LEXAPRO) 10 MG tablet Take 10 mg by mouth daily.    ? estradiol (ESTRACE) 1 MG tablet TAKE 1 TABLET AT BEDTIME (Patient taking differently: Take 1 mg by mouth at bedtime.) 90 tablet 3  ? famotidine (PEPCID) 40 MG tablet Take 1 tablet (40 mg total) by mouth at bedtime. 90 tablet 3  ? fluticasone (FLONASE) 50 MCG/ACT nasal spray SPRAY 2 SPRAYS INTRO EACH NOSTRIL EVERY DAY (Patient taking differently: Place 2 sprays into both nostrils daily as needed for allergies.) 48 g 1  ? haloperidol (HALDOL) 5 MG tablet Take 1 tablet (5 mg total) by mouth at  bedtime. For psychosis    ? levocetirizine (XYZAL) 5 MG tablet Take 1 tablet (5 mg total) by mouth every evening. 90 tablet 3  ? LINZESS 290 MCG CAPS capsule TAKE 1 CAPSULE BY MOUTH EVERY MORNING (Patient taking differently: Take 290 mcg by mouth daily before breakfast.) 90 capsule 3  ? loperamide (IMODIUM) 2 MG capsule Take 2 mg by mouth 4 (four) times daily as needed.    ? meloxicam (MOBIC) 15 MG tablet Take 15 mg by mouth at bedtime.   2  ? metoprolol succinate (TOPROL-XL) 25 MG 24 hr tablet TAKE 3 TABLETS BY MOUTH EVERY DAY (Patient taking differently: Take 25 mg by mouth daily.) 270 tablet 1  ?  Multiple Vitamins-Minerals (MULTIVITAMIN WITH MINERALS) tablet Take 1 tablet by mouth daily.    ? ondansetron (ZOFRAN) 4 MG tablet Take 1 tablet (4 mg total) by mouth every 8 (eight) hours as needed for nausea or vomiting. 20 tablet 0  ? OVER THE COUNTER MEDICATION Take 1 capsule by mouth daily. Fennel Seed    ? oxyCODONE-acetaminophen (PERCOCET) 7.5-325 MG tablet Take 1 tablet by mouth 6 (six) times daily.    ? pantoprazole (PROTONIX) 40 MG tablet Take 1 tablet (40 mg total) by mouth 2 (two) times daily. 180 tablet 2  ? potassium chloride SA (KLOR-CON M) 20 MEQ tablet TAKE 2 TABLETS BY MOUTH EVERY DAY (Patient taking differently: Take 20 mEq by mouth 2 (two) times daily. TAKE 2 TABLETS BY MOUTH EVERY DAY) 180 tablet 1  ? prazosin (MINIPRESS) 2 MG capsule Take 2 mg by mouth at bedtime.    ? promethazine (PHENERGAN) 25 MG tablet Take 1 tablet (25 mg total) by mouth every 8 (eight) hours as needed for nausea or vomiting. 20 tablet 0  ? solifenacin (VESICARE) 10 MG tablet Take 1 tablet (10 mg total) by mouth daily. 90 tablet 3  ? SUMAtriptan (IMITREX) 50 MG tablet TAKE 1 TAB EVERY 2 HOURS AS NEEDED FOR MIGRAINE. MAY REPEAT IN 2 HOURS IF PERSISTS OR RECURS (Patient taking differently: Take 50 mg by mouth every 2 (two) hours as needed for migraine.) 10 tablet 3  ? topiramate (TOPAMAX) 50 MG tablet TAKE 1 TABLET BY MOUTH TWICE  DAILY (Patient taking differently: Take 50 mg by mouth 2 (two) times daily.) 180 tablet 2  ? traZODone (DESYREL) 100 MG tablet Take 200 mg by mouth at bedtime.  0  ? ?No current facility-administered medicat

## 2021-07-18 LAB — COMPLETE METABOLIC PANEL WITH GFR
AG Ratio: 1.3 (calc) (ref 1.0–2.5)
ALT: 11 U/L (ref 6–29)
AST: 10 U/L (ref 10–35)
Albumin: 3.7 g/dL (ref 3.6–5.1)
Alkaline phosphatase (APISO): 76 U/L (ref 37–153)
BUN: 13 mg/dL (ref 7–25)
CO2: 25 mmol/L (ref 20–32)
Calcium: 9.4 mg/dL (ref 8.6–10.4)
Chloride: 104 mmol/L (ref 98–110)
Creat: 0.63 mg/dL (ref 0.50–1.03)
Globulin: 2.9 g/dL (calc) (ref 1.9–3.7)
Glucose, Bld: 85 mg/dL (ref 65–99)
Potassium: 4.5 mmol/L (ref 3.5–5.3)
Sodium: 137 mmol/L (ref 135–146)
Total Bilirubin: 0.3 mg/dL (ref 0.2–1.2)
Total Protein: 6.6 g/dL (ref 6.1–8.1)
eGFR: 103 mL/min/{1.73_m2} (ref >=60)

## 2021-07-18 LAB — CBC WITH DIFFERENTIAL/PLATELET
Absolute Monocytes: 410 cells/uL (ref 200–950)
Basophils Absolute: 68 cells/uL (ref 0–200)
Basophils Relative: 1.2 %
Eosinophils Absolute: 194 cells/uL (ref 15–500)
Eosinophils Relative: 3.4 %
HCT: 36 % (ref 35.0–45.0)
Hemoglobin: 12 g/dL (ref 11.7–15.5)
Lymphs Abs: 2559 cells/uL (ref 850–3900)
MCH: 32.5 pg (ref 27.0–33.0)
MCHC: 33.3 g/dL (ref 32.0–36.0)
MCV: 97.6 fL (ref 80.0–100.0)
MPV: 9.5 fL (ref 7.5–12.5)
Monocytes Relative: 7.2 %
Neutro Abs: 2468 cells/uL (ref 1500–7800)
Neutrophils Relative %: 43.3 %
Platelets: 359 10*3/uL (ref 140–400)
RBC: 3.69 10*6/uL — ABNORMAL LOW (ref 3.80–5.10)
RDW: 13 % (ref 11.0–15.0)
Total Lymphocyte: 44.9 %
WBC: 5.7 10*3/uL (ref 3.8–10.8)

## 2021-07-19 LAB — TEST AUTHORIZATION

## 2021-07-19 LAB — ANAEROBIC AND AEROBIC CULTURE
MICRO NUMBER:: 13195721
MICRO NUMBER:: 13195722
SPECIMEN QUALITY:: ADEQUATE
SPECIMEN QUALITY:: ADEQUATE

## 2021-07-19 LAB — TIQ-NTM

## 2021-07-19 LAB — HOUSE ACCOUNT TRACKING

## 2021-07-19 LAB — PATHOLOGIST SMEAR REVIEW

## 2021-07-20 ENCOUNTER — Ambulatory Visit (INDEPENDENT_AMBULATORY_CARE_PROVIDER_SITE_OTHER): Payer: Medicare Other | Admitting: Podiatry

## 2021-07-20 DIAGNOSIS — L97512 Non-pressure chronic ulcer of other part of right foot with fat layer exposed: Secondary | ICD-10-CM

## 2021-07-20 MED ORDER — SULFAMETHOXAZOLE-TRIMETHOPRIM 800-160 MG PO TABS: 1.0000 | ORAL_TABLET | Freq: Two times a day (BID) | ORAL | 0 refills | Status: DC

## 2021-07-21 NOTE — Progress Notes (Signed)
Subjective: ?[name] [name] is a 56 y.o. is seen today for follow-up evaluation of wound on the bottom of the right foot.  She has not had significant increase in drainage and no increase in swelling or redness in that section improved.  She has been on the Keflex still.  She still in the cam boot and using a cane for assistance.  Denies any fevers or chills.  No other concerns. ? ?Objective: ?General: No acute distress, AAOx3  ?DP/PT pulses palpable 2/4, CRT < 3 sec to all digits.  ?Right foot: On the area the previous wound is granular ulcer present with hyperkeratotic periwound.  After debridement the wound today does measure larger 1.5 x 1 x 1 cm.  It does not probe to bone.  There is some localized edema and faint erythema on the periphery of the wound.  There is no ascending cellulitis.  No fluctuation or crepitation.  There is no malodor. ?No pain with calf compression, swelling, warmth, erythema.  ? ? ? ? ?Assessment and Plan:  ?Ulceration right foot ? ?-Treatment options discussed including all alternatives, risks, and complications ?-Sharply debrided the wound utilizing a #312 with scalpel to debride nonviable devitalized tissue to help limit wound healing.  I debrided the wound through the subcutaneous tissue to the fat layer.  There was minimal bleeding and hemostasis achieved through manual compression.  I irrigated the wound with saline.  Silvadene was applied followed by dressing.  Tolerated well. ?-I will again order dressing through [facility] for silver alginate to packing the wound. ?-CAM boot, nonweightbearing, elevation.  She states that to be nonweightbearing she has been in the hospital.  Discussed going to the emergency room but she states "they are not going to admit me".  I discussed is any worsening of the emergency room for IV antibiotics. ?-Order to switch to Bactrim. ?-Referral to wound care center ? ?[name] DPM ? ?

## 2021-07-23 ENCOUNTER — Encounter (HOSPITAL_BASED_OUTPATIENT_CLINIC_OR_DEPARTMENT_OTHER): Payer: Self-pay

## 2021-07-23 ENCOUNTER — Ambulatory Visit (HOSPITAL_BASED_OUTPATIENT_CLINIC_OR_DEPARTMENT_OTHER): Admit: 2021-07-23 | Payer: Medicare Other | Admitting: Orthopaedic Surgery

## 2021-07-23 ENCOUNTER — Encounter (HOSPITAL_BASED_OUTPATIENT_CLINIC_OR_DEPARTMENT_OTHER): Payer: Medicare Other | Attending: Internal Medicine | Admitting: Internal Medicine

## 2021-07-23 DIAGNOSIS — G9009 Other idiopathic peripheral autonomic neuropathy: Secondary | ICD-10-CM | POA: Diagnosis not present

## 2021-07-23 DIAGNOSIS — L97512 Non-pressure chronic ulcer of other part of right foot with fat layer exposed: Secondary | ICD-10-CM | POA: Diagnosis not present

## 2021-07-23 DIAGNOSIS — E1142 Type 2 diabetes mellitus with diabetic polyneuropathy: Secondary | ICD-10-CM | POA: Diagnosis not present

## 2021-07-23 DIAGNOSIS — E11621 Type 2 diabetes mellitus with foot ulcer: Secondary | ICD-10-CM | POA: Diagnosis not present

## 2021-07-23 DIAGNOSIS — Z89431 Acquired absence of right foot: Secondary | ICD-10-CM | POA: Insufficient documentation

## 2021-07-23 SURGERY — ARTHROPLASTY, SHOULDER, TOTAL, REVERSE
Anesthesia: Choice | Site: Shoulder | Laterality: Right

## 2021-07-23 NOTE — Progress Notes (Signed)
[name], [name] ([id number]) ?Visit Report for 07/23/2021 ?Chief Complaint Document Details ?Patient Name: Date of Service: ?[name] [name], [name] D. 07/23/2021 1:15 PM ?Medical Record Number: [id number] ?Patient Account Number: [id number] ?Date of Birth/Sex: Treating RN: ?1966/02/14 (56 y.o. [name] [name] ?Primary Care Provider: Jenna Luo Other Clinician: ?Referring Provider: ?Treating Provider/Extender: Kalman Shan ?Celesta Gentile ?Weeks in Treatment: 0 ?Information Obtained from: Patient ?Chief Complaint ?06/03/2019; patient is here for review of a wound on her plantar foot transmetatarsal amputation site at roughly the third met head ?07/23/2021; patient presents for right plantar foot wound ?Electronic Signature(s) ?Signed: 07/23/2021 4:32:10 PM By: Kalman Shan DO ?Entered By: Kalman Shan on 07/23/2021 16:01:29 ?-------------------------------------------------------------------------------- ?Debridement Details ?Patient Name: Date of Service: ?CHLOE, MIYOSHI D. 07/23/2021 1:15 PM ?Medical Record Number: [id number] ?Patient Account Number: [id number] ?Date of Birth/Sex: Treating RN: ?1965/09/05 (56 y.o. F) Deaton, Bobbi ?Primary Care Provider: Jenna Luo Other Clinician: ?Referring Provider: ?Treating Provider/Extender: Kalman Shan ?Celesta Gentile ?Weeks in Treatment: 0 ?Debridement Performed for Assessment: Wound #2 Right,Plantar Foot ?Performed By: Physician Kalman Shan, DO ?Debridement Type: Chemical/Enzymatic/Mechanical ?Agent Used: gauze and wound cleanser ?Level of Consciousness (Pre-procedure): Awake and Alert ?Pre-procedure Verification/Time Out Yes - 14:34 ?Taken: ?Start Time: 14:35 ?Instrument: ?Other : Gauze ?Bleeding: Minimum ?Hemostasis Achieved: Pressure ?End Time: 14:38 ?Response to Treatment: Procedure was tolerated well ?Level of Consciousness (Post- Awake and Alert ?procedure): ?Post Debridement Measurements of Total Wound ?Length: (cm) 1 ?Width: (cm) 1.2 ?Depth: (cm)  1.2 ?Volume: (cm?) 1.131 ?Character of Wound/Ulcer Post Debridement: Stable ?Post Procedure Diagnosis ?Same as Pre-procedure ?Electronic Signature(s) ?Signed: 07/23/2021 4:32:10 PM By: Kalman Shan DO ?Signed: 07/23/2021 5:34:01 PM By: Deon Pilling RN, BSN ?Entered By: Deon Pilling on 07/23/2021 14:40:08 ?-------------------------------------------------------------------------------- ?HPI Details ?Patient Name: Date of Service: ?ANGELLEE, COHILL D. 07/23/2021 1:15 PM ?Medical Record Number: [id number] ?Patient Account Number: [id number] ?Date of Birth/Sex: Treating RN: ?Feb 28, 1966 (56 y.o. [name] [name] ?Primary Care Provider: Jenna Luo Other Clinician: ?Referring Provider: ?Treating Provider/Extender: Kalman Shan ?Celesta Gentile ?Weeks in Treatment: 0 ?History of Present Illness ?HPI Description: ADMISSION ?06/02/2020 ?This is a 56 year old woman who has had problems with wounds on her predominantly plantar right foot for quite a period of time. 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 More recently she has had an area ?developed in the right mid foot at roughly the third metatarsal head. She says this started as a blister of November. Its not been closing. More recently she ?has been using Silvadene cream she has a cam boot to offload. I note she walks with a cane ?The patient has had arterial studies done in 2019 at which time her ABI in the right was 1.28 with triphasic waveforms noncompressible on the left with triphasic ?waveform. Her ABI in our clinic today was 1.3 on the right. 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 ?She had a foot gastrocnemius recession sometime in the fall 2021 ?2/21; patient arrives today with a wound measuring slightly smaller 3 x 3 mm however  there is circumferential area of erythema around this. She does not ?have any complaints ?2/28; plantar wound on the right first transmetatarsal amputation site. Small oval-shaped wound raised skin around the edges. We have been using Hydrofera ?Blue. She does not have arterial issues ?06/23/2020 upon evaluation today patient appears  to be doing decently well. With regard to her wound. There is little bit of slough and some hyper granulation. I ?do not feel like the PolyMem did too well for her. In fact she may do better with some Hydrofera Blue. I know we just switch from that but I feel like that may ?be a better way to continue currently. ?3/15; this is a patient with a wound on the plantar right transmetatarsal amputation site probably the second metatarsal head not 1 July as stated previously we ?have been using polymen switch to [facility] last week. Wound looks about the same to me. We are going to start her on a total contact cast today we ?have the boot to fit ?3/18; first total contact cast change she tolerated this well ?3/31; she comes back in with out total contact cast on for 10 days. In spite of this the small wound over the third metatarsal head of her right transmetatarsal ?amputation site almost looks the same slightly hyper granulated. Circumference is not well adhered. We have been using Hydrofera Blue ?4/7 she comes back in in her total contact cast. The wound is smaller epithelializing and looks like it on its way to closing. We have been using Hydrofera Blue ?over the wound area ?She has a right transmetatarsal amputation. She tells [place] that she has been to [facility] in the past and they did a modified shoe for her however it ?did not keep this area epithelialized. She is going to bring that in next week for [place] to look at. It does not sound however that she was really offloading or ?padding this area at all ?4/14; total contact cast. I changed her to calcium alginate today. I think  most of the small area is epithelialized although there is clearly not complete ?epithelialization and even the area that is epithelialized and the small wound is very tiny ?She shows me her shoe with the insert. It looks as though the insert probably was too large for her transmet it foot allowing it to move back and forward. I can ?see where the friction was in the blister she describes forming. I have asked her to take this back to [facility] to see if there is anything they can do ?4/21; patient presents for her 1 week follow-up. She has been treated with a total contact cast weekly. She has no complaints today. She denies any ?fever/chills. ?4/26; patient presents for 1 week follow-up in total contact cast change. Patient is having knee pain to her right knee due to chronic osteoarthritis. She is hoping ?to be able to wear her brace over the cast. ?5/10; the wound is closed over however still looks vulnerable. ?5/17; the wound remains closed surface looks better. She has a previous right transmet. She is going to convert this into a sneaker. She had a forefoot ?prosthesis for the foot but not sure that that are too much good she is supposed to have taken aback from modification however she can do that now. ?6/8; patient returns to our clinic for reopening of the plantar right foot transmetatarsal amputation site. She has slight warmth and erythema to the area. She ?would like to try conservative wound care and not do the total contact cast that was she was doing before at this time ?10/01/2020 upon evaluation today patient appears to be doing okay in regard to her foot ulcer. Fortunately there does not appear to be any signs of active ?infection which  is great news and overall very pleased with where things stand today. The wound does appear to be doing well and overall appearance wise is ?significantly improved compared to last week in my opinion based on my review of her pictures today. Overall the  patient tells me that she is not having any ?significant pain which is great news as well. ?Readmission 07/23/2021 ?Ms. T Ermalinda Joubert is a 56 year old female with a past medical history of type 2 diab

## 2021-07-23 NOTE — Progress Notes (Addendum)
[name] [name], [name] [name] ([id number]) ?Visit Report for 07/23/2021 ?Allergy List Details ?Patient Name: Date of Service: ?[name] [name], [name] D. 07/23/2021 1:15 PM ?Medical Record Number: [id number] ?Patient Account Number: [id number] ?Date of Birth/Sex: Treating RN: ?04-Dec-1965 (56 y.o. [name] ?Primary Care Blong Busk: Jenna Luo Other Clinician: ?Referring Joclynn Lumb: ?Treating Tawana Pasch/Extender: Kalman Shan ?Celesta Gentile ?Weeks in Treatment: 0 ?Allergies ?Active Allergies ?codeine ?tetracycline ?penicillin ?tramadol ?estradiol ?Reaction: allergic to the patches ?ciprofloxacin ?phenazopyridine HCl ?Allergy Notes ?Electronic Signature(s) ?Signed: 07/23/2021 12:29:57 PM By: Lorrin Jackson ?Entered By: Lorrin Jackson on 07/23/2021 12:29:56 ?-------------------------------------------------------------------------------- ?Arrival Information Details ?Patient Name: Date of Service: ?[name] [name], [name] D. 07/23/2021 1:15 PM ?Medical Record Number: [id number] ?Patient Account Number: [id number] ?Date of Birth/Sex: Treating RN: ?12-Jan-1966 (56 y.o. F) Deaton, Bobbi ?Primary Care Kynli Chou: Jenna Luo Other Clinician: ?Referring Chany Woolworth: ?Treating Anaiyah Anglemyer/Extender: Kalman Shan ?Celesta Gentile ?Weeks in Treatment: 0 ?Visit Information ?Patient Arrived: Knee Scooter ?Arrival Time: 13:42 ?Accompanied By: self ?Transfer Assistance: None ?Patient Identification Verified: Yes ?Secondary Verification Process Completed: Yes ?Patient Requires Transmission-Based Precautions: No ?Patient Has Alerts: No ?History Since Last Visit ?Added or deleted any medications: No ?Any new allergies or adverse reactions: No ?Had a fall or experienced change in activities of daily living that may affect risk of falls: No ?Signs or symptoms of abuse/neglect since last visito No ?Hospitalized since last visit: No ?Implantable device outside of the clinic excluding cellular tissue based products placed in the center since last visit: No ?Has  Footwear/Offloading in Place as Prescribed: Yes ?Right: Removable Cast Walker/Walking Boot ?Pain Present Now: No ?Electronic Signature(s) ?Signed: 07/23/2021 5:34:01 PM By: Deon Pilling RN, BSN ?Entered By: Deon Pilling on 07/23/2021 13:43:04 ?-------------------------------------------------------------------------------- ?Clinic Level of Care Assessment Details ?Patient Name: Date of Service: ?[name] [name], [name] D. 07/23/2021 1:15 PM ?Medical Record Number: [id number] ?Patient Account Number: [id number] ?Date of Birth/Sex: Treating RN: ?[birth year] (56 y.o. F) Deaton, Bobbi ?Primary Care Zeric Baranowski: Jenna Luo Other Clinician: ?Referring Mylissa Lambe: ?Treating Symphony Demuro/Extender: Kalman Shan ?Celesta Gentile ?Weeks in Treatment: 0 ?Clinic Level of Care Assessment Items ?TOOL 1 Quantity Score ?X- 1 0 ?Use when EandM and Procedure is performed on INITIAL visit ?ASSESSMENTS - Nursing Assessment / Reassessment ?X- 1 20 ?General Physical Exam (combine w/ comprehensive assessment (listed just below) when performed on new pt. evals) ?X- 1 25 ?Comprehensive Assessment (HX, ROS, Risk Assessments, Wounds Hx, etc.) ?ASSESSMENTS - Wound and Skin Assessment / Reassessment ?'[]'$  - 0 ?Dermatologic / Skin Assessment (not related to wound area) ?ASSESSMENTS - Ostomy and/or Continence Assessment and Care ?'[]'$  - 0 ?Incontinence Assessment and Management ?'[]'$  - 0 ?Ostomy Care Assessment and Management (repouching, etc.) ?PROCESS - Coordination of Care ?'[]'$  - 0 ?Simple Patient / Family Education for ongoing care ?X- 1 20 ?Complex (extensive) Patient / Family Education for ongoing care ?X- 1 10 ?Staff obtains Consents, Records, T Results / Process Orders ?est ?'[]'$  - 0 ?Staff telephones HHA, Nursing Homes / Clarify orders / etc ?'[]'$  - 0 ?Routine Transfer to another Facility (non-emergent condition) ?'[]'$  - 0 ?Routine Hospital Admission (non-emergent condition) ?'[]'$  - 0 ?New Admissions / Biomedical engineer / Ordering NPWT Apligraf, etc. ?, ?'[]'$  -  0 ?Emergency Hospital Admission (emergent condition) ?PROCESS - Special Needs ?'[]'$  - 0 ?Pediatric / Minor Patient Management ?'[]'$  - 0 ?Isolation Patient Management ?'[]'$  - 0 ?Hearing / Language / Visual special needs ?'[]'$  - 0 ?Assessment of Community assistance (transportation, D/C planning, etc.) ?'[]'$  - 0 ?Additional assistance / Altered mentation ?'[]'$  - 0 ?Support Surface(s) Assessment (bed, cushion, seat, etc.) ?INTERVENTIONS - Miscellaneous ?'[]'$  -  0 ?External ear exam ?'[]'$  - 0 ?Patient Transfer (multiple staff / Civil Service fast streamer / Similar devices) ?'[]'$  - 0 ?Simple Staple / Suture removal (25 or less) ?'[]'$  - 0 ?Complex Staple / Suture removal (26 or more) ?'[]'$  - 0 ?Hypo/Hyperglycemic Management (do not check if billed separately) ?X- 1 15 ?Ankle / Brachial Index (ABI) - do not check if billed separately ?Has the patient been seen at the hospital within the last three years: Yes ?Total Score: 90 ?Level Of Care: New/Established - Level 3 ?Electronic Signature(s) ?Signed: 07/23/2021 5:34:01 PM By: Deon Pilling RN, BSN ?Entered By: Deon Pilling on 07/23/2021 15:05:02 ?-------------------------------------------------------------------------------- ?Encounter Discharge Information Details ?Patient Name: Date of Service: ?[name] [name], [name] D. 07/23/2021 1:15 PM ?Medical Record Number: [id number] ?Patient Account Number: [id number] ?Date of Birth/Sex: Treating RN: ?09/10/1965 (56 y.o. [name] ?Primary Care Teofil Maniaci: Jenna Luo Other Clinician: ?Referring Trevion Hoben: ?Treating Nanea Jared/Extender: Kalman Shan ?Celesta Gentile ?Weeks in Treatment: 0 ?Encounter Discharge Information Items Post Procedure Vitals ?Discharge Condition: Stable ?Temperature (F): 98.3 ?Ambulatory Status: Knee Scooter ?Pulse (bpm): 83 ?Discharge Destination: Home ?Respiratory Rate (breaths/min): 20 ?Transportation: Private Auto ?Blood Pressure (mmHg): 115/82 ?Schedule Follow-up Appointment: Yes ?Clinical Summary of Care: Provided on 07/23/2021 ?Form Type  Recipient ?Paper Patient Patient ?Electronic Signature(s) ?Signed: 07/23/2021 3:29:13 PM By: Lorrin Jackson ?Entered By: Lorrin Jackson on 07/23/2021 15:29:12 ?-------------------------------------------------------------------------------- ?Lower Extremity Assessment Details ?Patient Name: Date of Service: ?[name] [name], [name] D. 07/23/2021 1:15 PM ?Medical Record Number: [id number] ?Patient Account Number: [id number] ?Date of Birth/Sex: Treating RN: ?1966/03/30 (56 y.o. F) Deaton, Bobbi ?Primary Care Bradden Tadros: Jenna Luo Other Clinician: ?Referring Sadia Belfiore: ?Treating Samanyu Tinnell/Extender: Kalman Shan ?Celesta Gentile ?Weeks in Treatment: 0 ?Edema Assessment ?Assessed: [Left: No] [Right: Yes] ?Edema: [Left: N] [Right: o] ?Calf ?Left: Right: ?Point of Measurement: 26 cm From Medial Instep 37.5 cm ?Ankle ?Left: Right: ?Point of Measurement: 10 cm From Medial Instep 23 cm ?Knee To Floor ?Left: Right: ?From Medial Instep 36 cm ?Vascular Assessment ?Pulses: ?Dorsalis Pedis ?Palpable: [Right:Yes] ?Doppler Audible: [Right:Yes] ?Blood Pressure: ?Brachial: [Right:115] ?Ankle: ?[Right:Dorsalis Pedis: 120 1.04] ?Electronic Signature(s) ?Signed: 07/23/2021 5:34:01 PM By: Deon Pilling RN, BSN ?Entered By: Deon Pilling on 07/23/2021 14:02:22 ?-------------------------------------------------------------------------------- ?Multi Wound Chart Details ?Patient Name: ?Date of Service: ?[name] [name], [name] D. 07/23/2021 1:15 PM ?Medical Record Number: [id number] ?Patient Account Number: [id number] ?Date of Birth/Sex: ?Treating RN: ?1965-04-21 (57 y.o. [name] ?Primary Care Chad Tiznado: Jenna Luo ?Other Clinician: ?Referring Mahdiya Mossberg: ?Treating Maleia Weems/Extender: Kalman Shan ?Celesta Gentile ?Weeks in Treatment: 0 ?Vital Signs ?Height(in): 64 ?Pulse(bpm): 83 ?Weight(lbs): 228 ?Blood Pressure(mmHg): 115/82 ?Body Mass Index(BMI): 39.1 ?Temperature(??F): 98.3 ?Respiratory Rate(breaths/min): 20 ?Photos: [N/A:N/A] ?Right, Plantar  Foot N/A N/A ?Wound Location: ?Gradually Appeared N/A N/A ?Wounding Event: ?Neuropathic Ulcer-Non Diabetic N/A N/A ?Primary Etiology: ?Asthma, Sleep Apnea, Hypertension, N/A N/A ?Comorbid History: ?Docia Furl

## 2021-07-23 NOTE — Progress Notes (Signed)
[name] [name], [name] [name] ([id number]) ?Visit Report for 07/23/2021 ?Abuse Risk Screen Details ?Patient Name: Date of Service: ?[name] [name], [name] D. 07/23/2021 1:15 PM ?Medical Record Number: [id number] ?Patient Account Number: [id number] ?Date of Birth/Sex: Treating RN: ?[birth year] (56 y.o. F) Deaton, Bobbi ?Primary Care Sabrin Dunlevy: Jenna Luo Other Clinician: ?Referring Gareld Obrecht: ?Treating Gertrue Willette/Extender: Kalman Shan ?Celesta Gentile ?Weeks in Treatment: 0 ?Abuse Risk Screen Items ?Answer ?ABUSE RISK SCREEN: ?Has anyone close to you tried to hurt or harm you recentlyo No ?Do you feel uncomfortable with anyone in your familyo No ?Has anyone forced you do things that you didnt want to doo No ?Electronic Signature(s) ?Signed: 07/23/2021 5:34:01 PM By: Deon Pilling RN, BSN ?Entered By: Deon Pilling on 07/23/2021 13:43:33 ?-------------------------------------------------------------------------------- ?Activities of Daily Living Details ?Patient Name: Date of Service: ?[name] [name], [name] D. 07/23/2021 1:15 PM ?Medical Record Number: [id number] ?Patient Account Number: [id number] ?Date of Birth/Sex: Treating RN: ?07/05/1965 (56 y.o. F) Deaton, Bobbi ?Primary Care Taima Rada: Jenna Luo Other Clinician: ?Referring Cuthbert Turton: ?Treating Nayah Lukens/Extender: Kalman Shan ?Celesta Gentile ?Weeks in Treatment: 0 ?Activities of Daily Living Items ?Answer ?Activities of Daily Living (Please select one for each item) ?Drive Automobile Not Able ?T Medications ?ake Completely Able ?Use T elephone Completely Able ?Care for Appearance Completely Able ?Use T oilet Completely Able ?Bath / Shower Completely Able ?Dress Self Completely Able ?Feed Self Completely Able ?Walk Need Assistance ?Get In / Out Bed Completely Able ?Housework Completely Able ?Prepare Meals Completely Able ?Handle Money Completely Able ?Shop for Self Completely Able ?Electronic Signature(s) ?Signed: 07/23/2021 5:34:01 PM By: Deon Pilling RN, BSN ?Entered By: Deon Pilling  on 07/23/2021 13:43:53 ?-------------------------------------------------------------------------------- ?Education Screening Details ?Patient Name: ?Date of Service: ?[name] [name], [name] D. 07/23/2021 1:15 PM ?Medical Record Number: [id number] ?Patient Account Number: [id number] ?Date of Birth/Sex: ?Treating RN: ?20-Jan-1966 (56 y.o. F) Deaton, Bobbi ?Primary Care Datron Brakebill: Jenna Luo ?Other Clinician: ?Referring Chidera Dearcos: ?Treating Kylar Leonhardt/Extender: Kalman Shan ?Celesta Gentile ?Weeks in Treatment: 0 ?Primary Learner Assessed: Patient ?Learning Preferences/Education Level/Primary Language ?Learning Preference: Explanation, Demonstration, Printed Material ?Highest Education Level: Grade School ?Preferred Language: English ?Cognitive Barrier ?Language Barrier: No ?Translator Needed: No ?Memory Deficit: No ?Emotional Barrier: No ?Cultural/Religious Beliefs Affecting Medical Care: No ?Physical Barrier ?Impaired Vision: Yes Glasses ?Impaired Hearing: No ?Decreased Hand dexterity: No ?Knowledge/Comprehension ?Knowledge Level: High ?Comprehension Level: High ?Ability to understand written instructions: High ?Ability to understand verbal instructions: High ?Motivation ?Anxiety Level: Calm ?Cooperation: Cooperative ?Education Importance: Acknowledges Need ?Interest in Health Problems: Asks Questions ?Perception: Coherent ?Willingness to Engage in Self-Management High ?Activities: ?Readiness to Engage in Self-Management High ?Activities: ?Electronic Signature(s) ?Signed: 07/23/2021 5:34:01 PM By: Deon Pilling RN, BSN ?Entered By: Deon Pilling on 07/23/2021 13:44:19 ?-------------------------------------------------------------------------------- ?Fall Risk Assessment Details ?Patient Name: ?Date of Service: ?[name] [name], [name] D. 07/23/2021 1:15 PM ?Medical Record Number: [id number] ?Patient Account Number: [id number] ?Date of Birth/Sex: ?Treating RN: ?12/16/1965 (56 y.o. F) Deaton, Bobbi ?Primary Care Monicia Tse: Jenna Luo ?Other Clinician: ?Referring Philomina Leon: ?Treating Tehran Rabenold/Extender: Kalman Shan ?Celesta Gentile ?Weeks in Treatment: 0 ?Fall Risk Assessment Items ?Have you had 2 or more falls in the last 12 monthso 0 Yes ?Have you had any fall that resulted in injury in the last 12 monthso 0 Yes ?FALLS RISK SCREEN ?History of falling - immediate or within 3 months 25 Yes ?Secondary diagnosis (Do you have 2 or more medical diagnoseso) 0 No ?Ambulatory aid ?None/bed rest/wheelchair/nurse 0 No ?Crutches/cane/walker 15 Yes ?Furniture 0 No ?Intravenous therapy Access/Saline/Heparin Lock 0 No ?Gait/Transferring ?Normal/ bed rest/ wheelchair 0 No ?Weak (short steps with or without shuffle,  stooped but able to lift head while walking, may seek 10 Yes ?support from furniture) ?Impaired (short steps with shuffle, may have difficulty arising from chair, head down, impaired 0 No ?balance) ?Mental Status ?Oriented to own ability 0 Yes ?Notes ?right shoulder tear ?Electronic Signature(s) ?Signed: 07/23/2021 5:34:01 PM By: Deon Pilling RN, BSN ?Entered By: Deon Pilling on 07/23/2021 13:44:49 ?-------------------------------------------------------------------------------- ?Foot Assessment Details ?Patient Name: Date of Service: ?[name] [name], [name] D. 07/23/2021 1:15 PM ?Medical Record Number: [id number] ?Patient Account Number: [id number] ?Date of Birth/Sex: Treating RN: ?08-17-1965 (57 y.o. F) Deaton, Bobbi ?Primary Care Hermann Dottavio: Jenna Luo Other Clinician: ?Referring Jaaron Oleson: ?Treating Darria Corvera/Extender: Kalman Shan ?Celesta Gentile ?Weeks in Treatment: 0 ?Foot Assessment Items ?Site Locations ?+ = Sensation present, - = Sensation absent, C = Callus, U = Ulcer ?R = Redness, W = Warmth, M = Maceration, PU = Pre-ulcerative lesion ?F = Fissure, S = Swelling, D = Dryness ?Assessment ?Right: Left: ?Other Deformity: No No ?Prior Foot Ulcer: Yes No ?Prior Amputation: Yes No ?Charcot Joint: No No ?Ambulatory Status: Ambulatory With  Help ?Assistance Device: Walker ?Gait: Steady ?Notes ?Uses Knee Scooter ?Electronic Signature(s) ?Signed: 07/23/2021 5:34:01 PM By: Deon Pilling RN, BSN ?Entered By: Deon Pilling on 07/23/2021 13:53:44 ?-------------------------------------------------------------------------------- ?Nutrition Risk Screening Details ?Patient Name: ?Date of Service: ?[name] [name], [name] D. 07/23/2021 1:15 PM ?Medical Record Number: [id number] ?Patient Account Number: [id number] ?Date of Birth/Sex: ?Treating RN: ?[birth year] (56 y.o. F) Deaton, Bobbi ?Primary Care Naryah Clenney: Jenna Luo ?Other Clinician: ?Referring Nairi Oswald: ?Treating Jalie Eiland/Extender: Kalman Shan ?Celesta Gentile ?Weeks in Treatment: 0 ?Height (in): 64 ?Weight (lbs): 228 ?Body Mass Index (BMI): 39.1 ?Nutrition Risk Screening Items ?Score Screening ?NUTRITION RISK SCREEN: ?I have an illness or condition that made me change the kind and/or amount of food I eat 2 Yes ?I eat fewer than two meals per day 0 No ?I eat few fruits and vegetables, or milk products 0 No ?I have three or more drinks of beer, liquor or wine almost every day 0 No ?I have tooth or mouth problems that make it hard for me to eat 0 No ?I don't always have enough money to buy the food I need 0 No ?I eat alone most of the time 0 No ?I take three or more different prescribed or over-the-counter drugs a day 1 Yes ?Without wanting to, I have lost or gained 10 pounds in the last six months 0 No ?I am not always physically able to shop, cook and/or feed myself 0 No ?Nutrition Protocols ?Good Risk Protocol ?Provide education on elevated blood ?Moderate Risk Protocol 0 sugars and impact on wound healing, ?as applicable ?High Risk Proctocol ?Risk Level: Moderate Risk ?Score: 3 ?Electronic Signature(s) ?Signed: 07/23/2021 5:34:01 PM By: Deon Pilling RN, BSN ?Entered By: Deon Pilling on 07/23/2021 13:45:05 ?

## 2021-07-24 ENCOUNTER — Telehealth: Payer: Self-pay | Admitting: Pharmacist

## 2021-07-24 DIAGNOSIS — S91209D Unspecified open wound of unspecified toe(s) with damage to nail, subsequent encounter: Secondary | ICD-10-CM | POA: Diagnosis not present

## 2021-07-24 NOTE — Progress Notes (Signed)
? ? ?Chronic Care Management ?Pharmacy Assistant  ? ?Name: [name] [name]  MRN: [id number] DOB: Sep 15, 1965 ? ? ?Reason for Encounter: Medication Coordination Call ?  ? ?Recent office visits:  ?07/17/2021 [facility] (PCP) [name], MD;  I recommended reducing the dose of trazodone.  I believe the less sedating medication she takes the quicker her mind will return back to her baseline level of alertness. ? ?Recent consult visits:  ?07/20/2021 [facility] (Podiatry) [name], DPM; no medication changes indicated. ? ?07/13/2021 [facility] (Podiatry) [name], DPM; no medication changes indicated. -Recommend to hold off on shoulder surgery at this point ? ?Hospital visits:  ?06/30/2021 ED to Hospital Admission due to Hyponatremia ?Admit date:     06/30/2021  ?Discharge date: 07/02/2021  ?-Treated with 1 dose of azithromycin 1 g x 1 with good response.  Patient has had not had any further diarrhea and tolerating diet ? ?06/08/2021 ED visit for Acute nonintractable headache ?-No medication changes indicated. ? ?Medications: ?Outpatient Encounter Medications as of 07/24/2021  ?Medication Sig  ? AIMOVIG 70 MG/ML SOAJ ADMINISTER 1 ML UNDER THE SKIN EVERY 30 DAYS  ? albuterol (PROAIR HFA) 108 (90 Base) MCG/ACT inhaler Inhale 2 puffs = 110mg into the lungs every 6 (six) hours as needed for wheezing or shortness of breath.  ? busPIRone (BUSPAR) 15 MG tablet Take 15 mg by mouth 3 (three) times daily.   ? Calcium Carbonate-Vit D-Min (CALCIUM 1200 PO) Take 1,200 mg by mouth daily.   ? cephALEXin (KEFLEX) 500 MG capsule Take 1 capsule (500 mg total) by mouth 4 (four) times daily.  ? cyclobenzaprine (FLEXERIL) 10 MG tablet Take 10 mg by mouth 2 (two) times daily as needed for muscle spasms.   ? DULoxetine (CYMBALTA) 60 MG capsule TAKE 1 CAPSULE BY MOUTH EVERY DAY  ? escitalopram (LEXAPRO) 10 MG tablet Take 10 mg by mouth daily.  ? estradiol (ESTRACE) 1 MG tablet TAKE 1 TABLET AT BEDTIME (Patient taking differently: Take 1 mg by  mouth at bedtime.)  ? famotidine (PEPCID) 40 MG tablet Take 1 tablet (40 mg total) by mouth at bedtime.  ? fluticasone (FLONASE) 50 MCG/ACT nasal spray SPRAY 2 SPRAYS INTRO EACH NOSTRIL EVERY DAY (Patient taking differently: Place 2 sprays into both nostrils daily as needed for allergies.)  ? haloperidol (HALDOL) 5 MG tablet Take 1 tablet (5 mg total) by mouth at bedtime. For psychosis  ? levocetirizine (XYZAL) 5 MG tablet Take 1 tablet (5 mg total) by mouth every evening.  ? LINZESS 290 MCG CAPS capsule TAKE 1 CAPSULE BY MOUTH EVERY MORNING (Patient taking differently: Take 290 mcg by mouth daily before breakfast.)  ? loperamide (IMODIUM) 2 MG capsule Take 2 mg by mouth 4 (four) times daily as needed.  ? meloxicam (MOBIC) 15 MG tablet Take 15 mg by mouth at bedtime.   ? metoprolol succinate (TOPROL-XL) 25 MG 24 hr tablet TAKE 3 TABLETS BY MOUTH EVERY DAY (Patient taking differently: Take 25 mg by mouth daily.)  ? Multiple Vitamins-Minerals (MULTIVITAMIN WITH MINERALS) tablet Take 1 tablet by mouth daily.  ? ondansetron (ZOFRAN) 4 MG tablet Take 1 tablet (4 mg total) by mouth every 8 (eight) hours as needed for nausea or vomiting.  ? OVER THE COUNTER MEDICATION Take 1 capsule by mouth daily. Fennel Seed  ? oxyCODONE-acetaminophen (PERCOCET) 7.5-325 MG tablet Take 1 tablet by mouth 6 (six) times daily.  ? pantoprazole (PROTONIX) 40 MG tablet Take 1 tablet (40 mg total) by mouth 2 (  two) times daily.  ? potassium chloride SA (KLOR-CON M) 20 MEQ tablet TAKE 2 TABLETS BY MOUTH EVERY DAY (Patient taking differently: Take 20 mEq by mouth 2 (two) times daily. TAKE 2 TABLETS BY MOUTH EVERY DAY)  ? prazosin (MINIPRESS) 2 MG capsule Take 2 mg by mouth at bedtime.  ? promethazine (PHENERGAN) 25 MG tablet Take 1 tablet (25 mg total) by mouth every 8 (eight) hours as needed for nausea or vomiting.  ? solifenacin (VESICARE) 10 MG tablet Take 1 tablet (10 mg total) by mouth daily.  ? sulfamethoxazole-trimethoprim (BACTRIM DS)  800-160 MG tablet Take 1 tablet by mouth 2 (two) times daily.  ? SUMAtriptan (IMITREX) 50 MG tablet TAKE 1 TAB EVERY 2 HOURS AS NEEDED FOR MIGRAINE. MAY REPEAT IN 2 HOURS IF PERSISTS OR RECURS (Patient taking differently: Take 50 mg by mouth every 2 (two) hours as needed for migraine.)  ? topiramate (TOPAMAX) 50 MG tablet TAKE 1 TABLET BY MOUTH TWICE DAILY (Patient taking differently: Take 50 mg by mouth 2 (two) times daily.)  ? traZODone (DESYREL) 100 MG tablet Take 200 mg by mouth at bedtime.  ? ?No facility-administered encounter medications on file as of 07/24/2021.  ? ?Reviewed chart for medication changes ahead of medication coordination call. ? ?BP Readings from Last 3 Encounters:  ?07/17/21 122/76  ?07/02/21 125/74  ?06/30/21 115/72  ?  ?Lab Results  ?Component Value Date  ? HGBA1C 5.6 08/26/2019  ?  ? ?Patient obtains medications through Vials  90 Days  ? ?Patient is due for next adherence delivery on: 08/05/2021. ?Called patient and reviewed medications and coordinated delivery. ? ?This delivery to include: ?Potassium chloride 20 meq; two tablets once daily ?Meloxicam 15 mg once daily ?Solifenacin 10 mg once daily ?Estradiol 1 mg once daily ?Cyclobenzaprine 10 mg twice daily as needed ?Famotidine 40 mg at bedtime ?Pantoprazole 40 mg twice daily ?Duloxetine 60 mg once daily ?Pregabalin 100 mg ?Levocetirizine 5 mg once daily ?Aimovig 70 mg ?Linzess 290 mcg once daily ?Topiramate 50 mg twice daily ?Prazosin 2 mg at bedtime ?Haloperidol 5 mg at bedtime ?Escitalopram 10 mg once daily ?Metoprolol 25 mg three tablets daily ?Buspirone 15 mg three times daily ?Trazodone 100 mg two tablets at bedtime as needed ? ?Patient needs refills for: ?Potassium chloride 20 meq; two tablets once daily ?Estradiol 1 mg once daily ?Pregabalin 100 mg ?Aimovig 70 mg ?Topiramate 50 mg twice daily ?-Rx refill request sent to [facility]. ? ?Confirmed delivery date of 08/05/2021, advised patient that pharmacy will contact them the morning of  delivery. ? ?Care Gaps: ?Medicare Annual Wellness: Completed 08/21/2020 ?Hemoglobin A1C: 5.6% on 08/26/2019 ?Colonoscopy: Overdue since 04/19/2021 ?Mammogram: Next due on 12/04/2022 ? ?Future Appointments  ?Date Time Provider [place]  ?07/27/2021  4:30 PM Jacqualyn Posey Bonna Gains, DPM [facility]  ?07/30/2021 12:30 PM Ricard Dillon, MD [facility]  ?09/03/2021  3:30 PM [facility]-NURSE HEALTH ADVISOR [facility]  ?12/10/2021  2:15 PM [facility] PHARMACIST [facility]  ? ?April D Calhoun, [place] ?Clinical Pharmacist Assistant ?[phone] ?

## 2021-07-27 ENCOUNTER — Other Ambulatory Visit: Payer: Self-pay

## 2021-07-27 ENCOUNTER — Ambulatory Visit (INDEPENDENT_AMBULATORY_CARE_PROVIDER_SITE_OTHER): Payer: Medicare Other | Admitting: Podiatry

## 2021-07-27 DIAGNOSIS — L97512 Non-pressure chronic ulcer of other part of right foot with fat layer exposed: Secondary | ICD-10-CM | POA: Diagnosis not present

## 2021-07-27 MED ORDER — PREGABALIN 100 MG PO CAPS: 100.0000 mg | ORAL_CAPSULE | Freq: Two times a day (BID) | ORAL | 3 refills | Status: DC

## 2021-07-27 MED ORDER — AIMOVIG 70 MG/ML ~~LOC~~ SOAJ: SUBCUTANEOUS | 0 refills | Status: DC

## 2021-07-27 MED ORDER — POTASSIUM CHLORIDE CRYS ER 20 MEQ PO TBCR: EXTENDED_RELEASE_TABLET | ORAL | 1 refills | Status: DC

## 2021-07-27 MED ORDER — ESTRADIOL 1 MG PO TABS: 1.0000 mg | ORAL_TABLET | Freq: Every day | ORAL | 3 refills | Status: DC

## 2021-07-27 MED ORDER — TOPIRAMATE 50 MG PO TABS: 50.0000 mg | ORAL_TABLET | Freq: Two times a day (BID) | ORAL | 2 refills | Status: DC

## 2021-07-27 NOTE — Telephone Encounter (Signed)
All Rx's has been sent except for the Lyrica, needs approval from Dr. Dennard Schaumann.  ?

## 2021-07-27 NOTE — Telephone Encounter (Signed)
LOV 07/17/21 ?Last refill 06/29/21, #90, 3 refills ? ?Please review, thanks! ? ?

## 2021-07-29 ENCOUNTER — Telehealth: Payer: Self-pay

## 2021-07-29 NOTE — Progress Notes (Signed)
Subjective: [name] is a 56 y.o. is seen today for follow-up evaluation of wound on the bottom of the right foot.  She did follow-up with the wound care center and she presents today for 1 more appoint with me.  She has been using Dakin's solution to the wound and seems to be getting better.  Denies any purulence.  No concerns wound or redness.  No pain although she does have significant neuropathy.  Denies any fevers or chills. ? ?Objective: ?General: No acute distress, AAOx3  ?DP/PT pulses palpable 2/4, CRT < 3 sec to all digits.  ?Right foot: On the area the previous wound is granular ulcer present with hyperkeratotic periwound.  After debridement the wound today does measure about the same 1.5 x 1 x 1 cm.  There are areas in the periphery of the wound that is more superficial there is increased granulation tissue present within the wound bed.  It does not probe to bone.  No significant surrounding erythema, ascending cellulitis.  The area of fibrotic tissue around the wound is where the skin has peeled off.  No signs of infection noted.  No purulence or malodor. ?No pain with calf compression, swelling, warmth, erythema.  ? ? ?Assessment and Plan:  ?Ulceration right foot ? ?-Treatment options discussed including all alternatives, risks, and complications ?-No debridement of the wound today.  This is baseline, the wound care center.  Continue Dakin's solution for now.  Monitor further further treatment to the wound care center.  Discussed that once the wound is healed she is to follow-up as we can further offload this and I will have her follow-up with Aaron Edelman for inserts to further offload.  In the meantime encouraged, any questions or concerns or any changes. ? ?Return if symptoms worsen or fail to improve. ? ?[name] DPM ?

## 2021-07-29 NOTE — Telephone Encounter (Signed)
Rx's sent to pharmacy 07/27/21 ?

## 2021-07-30 ENCOUNTER — Encounter (HOSPITAL_BASED_OUTPATIENT_CLINIC_OR_DEPARTMENT_OTHER): Payer: Medicare Other | Admitting: Internal Medicine

## 2021-07-30 DIAGNOSIS — L97512 Non-pressure chronic ulcer of other part of right foot with fat layer exposed: Secondary | ICD-10-CM | POA: Diagnosis not present

## 2021-07-30 DIAGNOSIS — E11621 Type 2 diabetes mellitus with foot ulcer: Secondary | ICD-10-CM | POA: Diagnosis not present

## 2021-07-30 DIAGNOSIS — G9009 Other idiopathic peripheral autonomic neuropathy: Secondary | ICD-10-CM | POA: Diagnosis not present

## 2021-07-30 DIAGNOSIS — Z89431 Acquired absence of right foot: Secondary | ICD-10-CM | POA: Diagnosis not present

## 2021-07-30 DIAGNOSIS — E1142 Type 2 diabetes mellitus with diabetic polyneuropathy: Secondary | ICD-10-CM | POA: Diagnosis not present

## 2021-07-30 NOTE — Progress Notes (Signed)
[name] [name], [name] D. ([id number]) ?Visit Report for 07/30/2021 ?HPI Details ?Patient Name: Date of Service: ?[name] [name], [name] D. 07/30/2021 12:30 PM ?Medical Record Number: [id number] ?Patient Account Number: [id number] ?Date of Birth/Sex: Treating RN: ?10/07/1965 (55 y.o. F) Barnhart, Jodi ?Primary Care Provider: Pickard, Warren Other Clinician: ?Referring Provider: ?Treating Provider/Extender: ,  ?Pickard, Warren ?Weeks in Treatment: 1 ?History of Present Illness ?HPI Description: ADMISSION ?06/02/2020 ?This is a 54-year-old woman who has had problems with wounds on her predominantly plantar right foot for quite a period of time. 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 More recently she has had an area ?developed in the right mid foot at roughly the third metatarsal head. She says this started as a blister of November. Its not been closing. More recently she ?has been using Silvadene cream she has a cam boot to offload. I note she walks with a cane ?The patient has had arterial studies done in 2019 at which time her ABI in the right was 1.28 with triphasic waveforms noncompressible on the left with triphasic ?waveform. Her ABI in our clinic today was 1.3 on the right. 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 ?She had a foot gastrocnemius recession sometime in the fall 2021 ?2/21; patient arrives today with a wound measuring slightly smaller 3 x 3 mm however there is circumferential area of erythema around this. She does not ?have any complaints ?2/28; plantar wound on the right first transmetatarsal amputation site. Small oval-shaped wound raised skin around the edges. We have been using Hydrofera ?Blue. She does not have arterial issues ?06/23/2020 upon evaluation today patient  appears to be doing decently well. With regard to her wound. There is little bit of slough and some hyper granulation. I ?do not feel like the PolyMem did too well for her. In fact she may do better with some Hydrofera Blue. I know we just switch from that but I feel like that may ?be a better way to continue currently. ?3/15; this is a patient with a wound on the plantar right transmetatarsal amputation site probably the second metatarsal head not 1 July as stated previously we ?have been using polymen switch to Hydrofera Blue last week. Wound looks about the same to me. We are going to start her on a total contact cast today we ?have the boot to fit ?3/18; first total contact cast change she tolerated this well ?3/31; she comes back in with out total contact cast on for 10 days. In spite of this the small wound over the third metatarsal head of her right transmetatarsal ?amputation site almost looks the same slightly hyper granulated. Circumference is not well adhered. We have been using Hydrofera Blue ?4/7 she comes back in in her total contact cast. The wound is smaller epithelializing and looks like it on its way to closing. We have been using Hydrofera Blue ?over the wound area ?She has a right transmetatarsal amputation. She tells us that she has been to [facility] in the past and they did a modified shoe for her however it ?did not keep this area epithelialized. She is going to bring that in next week for us to look at. It does not sound however that she was really offloading or ?padding this area   at all ?4/14; total contact cast. I changed her to calcium alginate today. I think most of the small area is epithelialized although there is clearly not complete ?epithelialization and even the area that is epithelialized and the small wound is very tiny ?She shows me her shoe with the insert. It looks as though the insert probably was too large for her transmet it foot allowing it to move back and  forward. I can ?see where the friction was in the blister she describes forming. I have asked her to take this back to [facility] to see if there is anything they can do ?4/21; patient presents for her 1 week follow-up. She has been treated with a total contact cast weekly. She has no complaints today. She denies any ?fever/chills. ?4/26; patient presents for 1 week follow-up in total contact cast change. Patient is having knee pain to her right knee due to chronic osteoarthritis. She is hoping ?to be able to wear her brace over the cast. ?5/10; the wound is closed over however still looks vulnerable. ?5/17; the wound remains closed surface looks better. She has a previous right transmet. She is going to convert this into a sneaker. She had a forefoot ?prosthesis for the foot but not sure that that are too much good she is supposed to have taken aback from modification however she can do that now. ?6/8; patient returns to our clinic for reopening of the plantar right foot transmetatarsal amputation site. She has slight warmth and erythema to the area. She ?would like to try conservative wound care and not do the total contact cast that was she was doing before at this time ?10/01/2020 upon evaluation today patient appears to be doing okay in regard to her foot ulcer. Fortunately there does not appear to be any signs of active ?infection which is great news and overall very pleased with where things stand today. The wound does appear to be doing well and overall appearance wise is ?significantly improved compared to last week in my opinion based on my review of her pictures today. Overall the patient tells me that she is not having any ?significant pain which is great news as well. 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 This is a reoccurring wound for the patient. She has been following with Dr. ?Wagner for this issue. She had a wound culture that grew Staph aureus sensitive to Bactrim and she is currently on this. She has been using Silvadene to the ?wound bed. She has a cam walker boot and an offloading scooter that she uses. She currently denies signs of infection. ?4/13; this is a patient that is a type II diabetic complicated by peripheral neuropathy. She has had a previous transmetatarsal amputation of the right foot. She ?has a new wound on the plantar right foot between the first and second met heads probably close to the lateral part of the first met head. Generally small clean ?wound however from 7-9 o'clock there is a probing area close to bone. She finished a course of trimethoprim/sulfamethoxazole yesterday for methicillin- ?resistant Staph aureus in the wound. I believe this was ordered by podiatry she has been using a Dakin's wet-to-dry dressing. She uses a cam boot and a knee ?scooter. She says she is   religious with this ?Electronic Signature(s) ?Signed: 07/30/2021 4:28:39 PM By: Linton Ham MD ?Entered By: Linton Ham on 07/30/2021 13:02:35 ?-------------------------------------------------------------------------------- ?Physical Exam Details ?Patient Name: Date of Service: ?[name] [name], [name] D. 07/30/2021 12:30 PM ?Medical Record Number: [id number] ?Patient Account Number: [id number] ?Date of Birth/Sex: Treating RN: ?March 19, 1966 (56 y.o. [name] ?Primary Care Provider: Jenna Luo Other Clinician: ?Referring Provider: ?Treating Provider/Extender: Linton Ham ?Jenna Luo ?Weeks in Treatment: 1 ?Constitutional ?Sitting or standing Blood Pressure is within target range for patient.. Pulse regular and within target range for patient.Marland Kitchen Respirations regular, non-labored and ?within target range.. Temperature is normal and within the target range for the  patient.Marland Kitchen Appears in no distress. ?Notes ?Wound exam right foot; plantar wound. Generally looks fairly healthy with good granulation however from 7-9 o'clock there is a sinus that tunnels precariously ?close to bon

## 2021-07-30 NOTE — Progress Notes (Signed)
CHEVELLE, COULSON ([id number]) ?Visit Report for 07/30/2021 ?Arrival Information Details ?Patient Name: Date of Service: ?IRASEMA, CHALK D. 07/30/2021 12:30 PM ?Medical Record Number: [id number] ?Patient Account Number: [id number] ?Date of Birth/Sex: Treating RN: ?August 12, 1965 (56 y.o. [name] ?Primary Care Alijah Hyde: Jenna Luo Other Clinician: ?Referring Rogan Ecklund: ?Treating Shakeema Lippman/Extender: Linton Ham ?Jenna Luo ?Weeks in Treatment: 1 ?Visit Information History Since Last Visit ?Added or deleted any medications: No ?Patient Arrived: Knee Scooter ?Any new allergies or adverse reactions: No ?Arrival Time: 12:37 ?Had a fall or experienced change in No ?Transfer Assistance: None ?activities of daily living that may affect ?Patient Identification Verified: Yes ?risk of falls: ?Secondary Verification Process Completed: Yes ?Signs or symptoms of abuse/neglect since last No ?Patient Requires Transmission-Based Precautions: No visito ?Patient Has Alerts: No ?Hospitalized since last visit: No ?Implantable device outside of the clinic No ?excluding ?cellular tissue based products placed in the ?center ?since last visit: ?Has Dressing in Place as Prescribed: Yes ?Has Footwear/Offloading in Place as Yes ?Prescribed: ?Right: Removable Cast Walker/Walking Boot ?Pain Present Now: No ?Electronic Signature(s) ?Signed: 07/30/2021 2:31:44 PM By: Lorrin Jackson ?Entered By: Lorrin Jackson on 07/30/2021 12:39:06 ?-------------------------------------------------------------------------------- ?Clinic Level of Care Assessment Details ?Patient Name: Date of Service: ?SHATYRA, BECKA D. 07/30/2021 12:30 PM ?Medical Record Number: [id number] ?Patient Account Number: [id number] ?Date of Birth/Sex: Treating RN: ?July 21, 1965 (56 y.o. [name] ?Primary Care Shelton Soler: Jenna Luo Other Clinician: ?Referring Tyreon Frigon: ?Treating Adrien Shankar/Extender: Linton Ham ?Jenna Luo ?Weeks in Treatment: 1 ?Clinic Level of  Care Assessment Items ?TOOL 4 Quantity Score ?X- 1 0 ?Use when only an EandM is performed on FOLLOW-UP visit ?ASSESSMENTS - Nursing Assessment / Reassessment ?X- 1 10 ?Reassessment of Co-morbidities (includes updates in patient status) ?X- 1 5 ?Reassessment of Adherence to Treatment Plan ?ASSESSMENTS - Wound and Skin A ssessment / Reassessment ?X - Simple Wound Assessment / Reassessment - one wound 1 5 ?'[]'$  - 0 ?Complex Wound Assessment / Reassessment - multiple wounds ?'[]'$  - 0 ?Dermatologic / Skin Assessment (not related to wound area) ?ASSESSMENTS - Focused Assessment ?'[]'$  - 0 ?Circumferential Edema Measurements - multi extremities ?'[]'$  - 0 ?Nutritional Assessment / Counseling / Intervention ?'[]'$  - 0 ?Lower Extremity Assessment (monofilament, tuning fork, pulses) ?'[]'$  - 0 ?Peripheral Arterial Disease Assessment (using hand held doppler) ?ASSESSMENTS - Ostomy and/or Continence Assessment and Care ?'[]'$  - 0 ?Incontinence Assessment and Management ?'[]'$  - 0 ?Ostomy Care Assessment and Management (repouching, etc.) ?PROCESS - Coordination of Care ?'[]'$  - 0 ?Simple Patient / Family Education for ongoing care ?X- 1 20 ?Complex (extensive) Patient / Family Education for ongoing care ?X- 1 10 ?Staff obtains Consents, Records, T Results / Process Orders ?est ?'[]'$  - 0 ?Staff telephones HHA, Nursing Homes / Clarify orders / etc ?'[]'$  - 0 ?Routine Transfer to another Facility (non-emergent condition) ?'[]'$  - 0 ?Routine Hospital Admission (non-emergent condition) ?'[]'$  - 0 ?New Admissions / Biomedical engineer / Ordering NPWT Apligraf, etc. ?, ?'[]'$  - 0 ?Emergency Hospital Admission (emergent condition) ?'[]'$  - 0 ?Simple Discharge Coordination ?'[]'$  - 0 ?Complex (extensive) Discharge Coordination ?PROCESS - Special Needs ?'[]'$  - 0 ?Pediatric / Minor Patient Management ?'[]'$  - 0 ?Isolation Patient Management ?'[]'$  - 0 ?Hearing / Language / Visual special needs ?'[]'$  - 0 ?Assessment of Community assistance (transportation, D/C planning, etc.) ?'[]'$  -  0 ?Additional assistance / Altered mentation ?'[]'$  - 0 ?Support Surface(s) Assessment (bed, cushion, seat, etc.) ?INTERVENTIONS - Wound Cleansing / Measurement ?X - Simple Wound Cleansing - one wound 1 5 ?'[]'$  - 0 ?Complex  Wound Cleansing - multiple wounds ?X- 1 5 ?Wound Imaging (photographs - any number of wounds) ?'[]'$  - 0 ?Wound Tracing (instead of photographs) ?X- 1 5 ?Simple Wound Measurement - one wound ?'[]'$  - 0 ?Complex Wound Measurement - multiple wounds ?INTERVENTIONS - Wound Dressings ?'[]'$  - 0 ?Small Wound Dressing one or multiple wounds ?X- 1 15 ?Medium Wound Dressing one or multiple wounds ?'[]'$  - 0 ?Large Wound Dressing one or multiple wounds ?'[]'$  - 0 ?Application of Medications - topical ?'[]'$  - 0 ?Application of Medications - injection ?INTERVENTIONS - Miscellaneous ?'[]'$  - 0 ?External ear exam ?'[]'$  - 0 ?Specimen Collection (cultures, biopsies, blood, body fluids, etc.) ?'[]'$  - 0 ?Specimen(s) / Culture(s) sent or taken to Lab for analysis ?'[]'$  - 0 ?Patient Transfer (multiple staff / Civil Service fast streamer / Similar devices) ?'[]'$  - 0 ?Simple Staple / Suture removal (25 or less) ?'[]'$  - 0 ?Complex Staple / Suture removal (26 or more) ?'[]'$  - 0 ?Hypo / Hyperglycemic Management (close monitor of Blood Glucose) ?'[]'$  - 0 ?Ankle / Brachial Index (ABI) - do not check if billed separately ?X- 1 5 ?Vital Signs ?Has the patient been seen at the hospital within the last three years: Yes ?Total Score: 85 ?Level Of Care: New/Established - Level 3 ?Electronic Signature(s) ?Signed: 07/30/2021 2:31:44 PM By: Lorrin Jackson ?Entered By: Lorrin Jackson on 07/30/2021 12:55:43 ?-------------------------------------------------------------------------------- ?Encounter Discharge Information Details ?Patient Name: Date of Service: ?KSENIYA, GRUNDEN D. 07/30/2021 12:30 PM ?Medical Record Number: [id number] ?Patient Account Number: [id number] ?Date of Birth/Sex: Treating RN: ?Aug 28, 1965 (56 y.o. [name] ?Primary Care Joenathan Sakuma: Jenna Luo Other  Clinician: ?Referring Mallissa Lorenzen: ?Treating Sylvio Weatherall/Extender: Linton Ham ?Jenna Luo ?Weeks in Treatment: 1 ?Encounter Discharge Information Items ?Discharge Condition: Stable ?Ambulatory Status: Knee Scooter ?Discharge Destination: Home ?Transportation: Private Auto ?Schedule Follow-up Appointment: Yes ?Clinical Summary of Care: Provided on 07/30/2021 ?Form Type Recipient ?Paper Patient Patient ?Electronic Signature(s) ?Signed: 07/30/2021 2:31:44 PM By: Lorrin Jackson ?Entered By: Lorrin Jackson on 07/30/2021 12:57:05 ?-------------------------------------------------------------------------------- ?Lower Extremity Assessment Details ?Patient Name: Date of Service: ?NARE, GASPARI D. 07/30/2021 12:30 PM ?Medical Record Number: [id number] ?Patient Account Number: [id number] ?Date of Birth/Sex: Treating RN: ?1966-03-15 (56 y.o. [name] ?Primary Care Octa Uplinger: Jenna Luo Other Clinician: ?Referring Nasia Cannan: ?Treating Reganne Messerschmidt/Extender: Linton Ham ?Jenna Luo ?Weeks in Treatment: 1 ?Edema Assessment ?Assessed: [Left: No] [Right: Yes] ?Edema: [Left: N] [Right: o] ?Calf ?Left: Right: ?Point of Measurement: 26 cm From Medial Instep 37 cm ?Ankle ?Left: Right: ?Point of Measurement: 10 cm From Medial Instep 23 cm ?Vascular Assessment ?Pulses: ?Dorsalis Pedis ?Palpable: [Right:Yes] ?Electronic Signature(s) ?Signed: 07/30/2021 2:31:44 PM By: Lorrin Jackson ?Entered By: Lorrin Jackson on 07/30/2021 12:44:59 ?-------------------------------------------------------------------------------- ?Multi Wound Chart Details ?Patient Name: ?Date of Service: ?SHAYLAN, TUTTON D. 07/30/2021 12:30 PM ?Medical Record Number: [id number] ?Patient Account Number: [id number] ?Date of Birth/Sex: ?Treating RN: ?Mar 21, 1966 (56 y.o. [name] ?Primary Care Tadashi Burkel: Jenna Luo ?Other Clinician: ?Referring Cynda Soule: ?Treating Keisy Strickler/Extender: Linton Ham ?Jenna Luo ?Weeks in Treatment: 1 ?Vital  Signs ?Height(in): 64 ?Pulse(bpm): 98 ?Weight(lbs): 228 ?Blood Pressure(mmHg): 100/69 ?Body Mass Index(BMI): 39.1 ?Temperature(??F): 98.2 ?Respiratory Rate(breaths/min): 18 ?Photos: [N/A:N/A] ?Right, Plantar Foot N/A N/A ?

## 2021-08-05 DIAGNOSIS — G894 Chronic pain syndrome: Secondary | ICD-10-CM | POA: Diagnosis not present

## 2021-08-05 DIAGNOSIS — M961 Postlaminectomy syndrome, not elsewhere classified: Secondary | ICD-10-CM | POA: Diagnosis not present

## 2021-08-05 DIAGNOSIS — L97519 Non-pressure chronic ulcer of other part of right foot with unspecified severity: Secondary | ICD-10-CM | POA: Diagnosis not present

## 2021-08-06 ENCOUNTER — Encounter (HOSPITAL_BASED_OUTPATIENT_CLINIC_OR_DEPARTMENT_OTHER): Payer: Medicare Other | Admitting: Internal Medicine

## 2021-08-06 DIAGNOSIS — L97512 Non-pressure chronic ulcer of other part of right foot with fat layer exposed: Secondary | ICD-10-CM

## 2021-08-06 DIAGNOSIS — E1142 Type 2 diabetes mellitus with diabetic polyneuropathy: Secondary | ICD-10-CM | POA: Diagnosis not present

## 2021-08-06 DIAGNOSIS — G9009 Other idiopathic peripheral autonomic neuropathy: Secondary | ICD-10-CM | POA: Diagnosis not present

## 2021-08-06 DIAGNOSIS — E11621 Type 2 diabetes mellitus with foot ulcer: Secondary | ICD-10-CM | POA: Diagnosis not present

## 2021-08-06 DIAGNOSIS — Z89431 Acquired absence of right foot: Secondary | ICD-10-CM | POA: Diagnosis not present

## 2021-08-06 NOTE — Progress Notes (Signed)
TIERRA, THOMA ([id number]) ?Visit Report for 08/06/2021 ?Arrival Information Details ?Patient Name: Date of Service: ?TALESHIA, LUFF D. 08/06/2021 10:15 A M ?Medical Record Number: [id number] ?Patient Account Number: [id number] ?Date of Birth/Sex: Treating RN: ?February 24, 1966 (56 y.o. Tonita Phoenix, Lauren ?Primary Care Haidynn Almendarez: Jenna Luo Other Clinician: ?Referring Briele Lagasse: ?Treating Husain Costabile/Extender: Kalman Shan ?Jenna Luo ?Weeks in Treatment: 2 ?Visit Information History Since Last Visit ?Added or deleted any medications: No ?Patient Arrived: Knee Scooter ?Any new allergies or adverse reactions: No ?Arrival Time: 11:11 ?Had a fall or experienced change in No ?Accompanied By: self ?activities of daily living that may affect ?Transfer Assistance: None ?risk of falls: ?Patient Identification Verified: Yes ?Signs or symptoms of abuse/neglect since last visito No ?Secondary Verification Process Completed: Yes ?Hospitalized since last visit: No ?Patient Requires Transmission-Based Precautions: No ?Implantable device outside of the clinic excluding No ?Patient Has Alerts: No ?cellular tissue based products placed in the center ?since last visit: ?Has Dressing in Place as Prescribed: Yes ?Pain Present Now: Yes ?Electronic Signature(s) ?Signed: 08/06/2021 5:27:50 PM By: Rhae Hammock RN ?Entered By: Rhae Hammock on 08/06/2021 11:11:54 ?-------------------------------------------------------------------------------- ?Clinic Level of Care Assessment Details ?Patient Name: Date of Service: ?LETZY, GULLICKSON D. 08/06/2021 10:15 A M ?Medical Record Number: [id number] ?Patient Account Number: [id number] ?Date of Birth/Sex: Treating RN: ?January 20, 1966 (56 y.o. Tonita Phoenix, Lauren ?Primary Care Jigar Zielke: Jenna Luo Other Clinician: ?Referring Iridessa Harrow: ?Treating Hazelee Harbold/Extender: Kalman Shan ?Jenna Luo ?Weeks in Treatment: 2 ?Clinic Level of Care Assessment Items ?TOOL 4 Quantity Score ?X- 1  0 ?Use when only an EandM is performed on FOLLOW-UP visit ?ASSESSMENTS - Nursing Assessment / Reassessment ?X- 1 10 ?Reassessment of Co-morbidities (includes updates in patient status) ?X- 1 5 ?Reassessment of Adherence to Treatment Plan ?ASSESSMENTS - Wound and Skin A ssessment / Reassessment ?X - Simple Wound Assessment / Reassessment - one wound 1 5 ?'[]'$  - 0 ?Complex Wound Assessment / Reassessment - multiple wounds ?'[]'$  - 0 ?Dermatologic / Skin Assessment (not related to wound area) ?ASSESSMENTS - Focused Assessment ?X- 1 5 ?Circumferential Edema Measurements - multi extremities ?'[]'$  - 0 ?Nutritional Assessment / Counseling / Intervention ?'[]'$  - 0 ?Lower Extremity Assessment (monofilament, tuning fork, pulses) ?'[]'$  - 0 ?Peripheral Arterial Disease Assessment (using hand held doppler) ?ASSESSMENTS - Ostomy and/or Continence Assessment and Care ?'[]'$  - 0 ?Incontinence Assessment and Management ?'[]'$  - 0 ?Ostomy Care Assessment and Management (repouching, etc.) ?PROCESS - Coordination of Care ?X - Simple Patient / Family Education for ongoing care 1 15 ?'[]'$  - 0 ?Complex (extensive) Patient / Family Education for ongoing care ?X- 1 10 ?Staff obtains Consents, Records, T Results / Process Orders ?est ?X- 1 10 ?Staff telephones HHA, Nursing Homes / Clarify orders / etc ?'[]'$  - 0 ?Routine Transfer to another Facility (non-emergent condition) ?'[]'$  - 0 ?Routine Hospital Admission (non-emergent condition) ?'[]'$  - 0 ?New Admissions / Biomedical engineer / Ordering NPWT Apligraf, etc. ?, ?'[]'$  - 0 ?Emergency Hospital Admission (emergent condition) ?X- 1 10 ?Simple Discharge Coordination ?'[]'$  - 0 ?Complex (extensive) Discharge Coordination ?PROCESS - Special Needs ?'[]'$  - 0 ?Pediatric / Minor Patient Management ?'[]'$  - 0 ?Isolation Patient Management ?'[]'$  - 0 ?Hearing / Language / Visual special needs ?'[]'$  - 0 ?Assessment of Community assistance (transportation, D/C planning, etc.) ?'[]'$  - 0 ?Additional assistance / Altered mentation ?'[]'$  -  0 ?Support Surface(s) Assessment (bed, cushion, seat, etc.) ?INTERVENTIONS - Wound Cleansing / Measurement ?X - Simple Wound Cleansing - one wound 1 5 ?'[]'$  - 0 ?Complex Wound Cleansing - multiple wounds ?  X- 1 5 ?Wound Imaging (photographs - any number of wounds) ?'[]'$  - 0 ?Wound Tracing (instead of photographs) ?X- 1 5 ?Simple Wound Measurement - one wound ?'[]'$  - 0 ?Complex Wound Measurement - multiple wounds ?INTERVENTIONS - Wound Dressings ?X - Small Wound Dressing one or multiple wounds 1 10 ?'[]'$  - 0 ?Medium Wound Dressing one or multiple wounds ?'[]'$  - 0 ?Large Wound Dressing one or multiple wounds ?'[]'$  - 0 ?Application of Medications - topical ?'[]'$  - 0 ?Application of Medications - injection ?INTERVENTIONS - Miscellaneous ?'[]'$  - 0 ?External ear exam ?'[]'$  - 0 ?Specimen Collection (cultures, biopsies, blood, body fluids, etc.) ?'[]'$  - 0 ?Specimen(s) / Culture(s) sent or taken to Lab for analysis ?'[]'$  - 0 ?Patient Transfer (multiple staff / Civil Service fast streamer / Similar devices) ?'[]'$  - 0 ?Simple Staple / Suture removal (25 or less) ?'[]'$  - 0 ?Complex Staple / Suture removal (26 or more) ?'[]'$  - 0 ?Hypo / Hyperglycemic Management (close monitor of Blood Glucose) ?'[]'$  - 0 ?Ankle / Brachial Index (ABI) - do not check if billed separately ?X- 1 5 ?Vital Signs ?Has the patient been seen at the hospital within the last three years: Yes ?Total Score: 100 ?Level Of Care: New/Established - Level 3 ?Electronic Signature(s) ?Signed: 08/06/2021 5:27:50 PM By: Rhae Hammock RN ?Entered By: Rhae Hammock on 08/06/2021 13:18:17 ?-------------------------------------------------------------------------------- ?Encounter Discharge Information Details ?Patient Name: Date of Service: ?SADEE, OSLAND D. 08/06/2021 10:15 A M ?Medical Record Number: [id number] ?Patient Account Number: [id number] ?Date of Birth/Sex: Treating RN: ?Aug 10, 1965 (56 y.o. Tonita Phoenix, Lauren ?Primary Care Sunny Aguon: Jenna Luo Other Clinician: ?Referring Hezakiah Champeau: ?Treating  Daquawn Seelman/Extender: Kalman Shan ?Jenna Luo ?Weeks in Treatment: 2 ?Encounter Discharge Information Items ?Discharge Condition: Stable ?Ambulatory Status: Knee Scooter ?Discharge Destination: Home ?Transportation: Private Auto ?Accompanied By: self ?Schedule Follow-up Appointment: Yes ?Clinical Summary of Care: Patient Declined ?Electronic Signature(s) ?Signed: 08/06/2021 5:27:50 PM By: Rhae Hammock RN ?Entered By: Rhae Hammock on 08/06/2021 13:18:54 ?-------------------------------------------------------------------------------- ?Lower Extremity Assessment Details ?Patient Name: Date of Service: ?MELISIA, LEMING D. 08/06/2021 10:15 A M ?Medical Record Number: [id number] ?Patient Account Number: [id number] ?Date of Birth/Sex: Treating RN: ?1966/01/03 (56 y.o. Tonita Phoenix, Lauren ?Primary Care Camil Hausmann: Jenna Luo Other Clinician: ?Referring Leler Brion: ?Treating Gianni Mihalik/Extender: Kalman Shan ?Jenna Luo ?Weeks in Treatment: 2 ?Edema Assessment ?Assessed: [Left: No] [Right: Yes] ?Edema: [Left: N] [Right: o] ?Calf ?Left: Right: ?Point of Measurement: 26 cm From Medial Instep 37 cm ?Ankle ?Left: Right: ?Point of Measurement: 10 cm From Medial Instep 23 cm ?Vascular Assessment ?Pulses: ?Dorsalis Pedis ?Palpable: [Right:Yes] ?Posterior Tibial ?Palpable: [Right:Yes] ?Electronic Signature(s) ?Signed: 08/06/2021 5:27:50 PM By: Rhae Hammock RN ?Entered By: Rhae Hammock on 08/06/2021 11:07:32 ?-------------------------------------------------------------------------------- ?Multi Wound Chart Details ?Patient Name: ?Date of Service: ?AZHIA, SIEFKEN D. 08/06/2021 10:15 A M ?Medical Record Number: [id number] ?Patient Account Number: [id number] ?Date of Birth/Sex: ?Treating RN: ?Aug 26, 1965 (56 y.o. [name] ?Primary Care Dawid Dupriest: Jenna Luo ?Other Clinician: ?Referring Raena Pau: ?Treating Jamahl Lemmons/Extender: Kalman Shan ?Jenna Luo ?Weeks in Treatment: 2 ?Vital  Signs ?Height(in): 64 ?Pulse(bpm): 74 ?Weight(lbs): 228 ?Blood Pressure(mmHg): 124/74 ?Body Mass Index(BMI): 39.1 ?Temperature(??F): 98.7 ?Respiratory Rate(breaths/min): 17 ?Photos: [N/A:N/A] ?Right, Plantar Foot N/A N/A ?W

## 2021-08-06 NOTE — Progress Notes (Signed)
HILIARY, OSORTO ([id number]) ?Visit Report for 08/06/2021 ?Chief Complaint Document Details ?Patient Name: Date of Service: ?[name] [name], [name] D. 08/06/2021 10:15 A M ?Medical Record Number: [id number] ?Patient Account Number: [id number] ?Date of Birth/Sex: Treating RN: ?[birth year] (56 y.o. [name] ?Primary Care Provider: Jenna Luo Other Clinician: ?Referring Provider: ?Treating Provider/Extender: Kalman Shan ?Jenna Luo ?Weeks in Treatment: 2 ?Information Obtained from: Patient ?Chief Complaint ?06/03/2019; patient is here for review of a wound on her plantar foot transmetatarsal amputation site at roughly the third met head ?07/23/2021; patient presents for right plantar foot wound ?Electronic Signature(s) ?Signed: 08/06/2021 1:32:24 PM By: Kalman Shan DO ?Entered By: Kalman Shan on 08/06/2021 12:41:36 ?-------------------------------------------------------------------------------- ?HPI Details ?Patient Name: Date of Service: ?[name] [name], [name] D. 08/06/2021 10:15 A M ?Medical Record Number: [id number] ?Patient Account Number: [id number] ?Date of Birth/Sex: Treating RN: ?[birth year] (56 y.o. [name] ?Primary Care Provider: Jenna Luo Other Clinician: ?Referring Provider: ?Treating Provider/Extender: Kalman Shan ?Jenna Luo ?Weeks in Treatment: 2 ?History of Present Illness ?HPI Description: ADMISSION ?06/02/2020 ?This is a 56 year old woman who has had problems with wounds on her predominantly plantar right foot for quite a period of time. 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 More recently she has had an area ?developed in the right mid foot at roughly the third metatarsal head. She says this started as a blister of November. Its not been closing. More recently she ?has been using Silvadene cream she has a cam boot to offload. I note she walks with a  cane ?The patient has had arterial studies done in 2019 at which time her ABI in the right was 1.28 with triphasic waveforms noncompressible on the left with triphasic ?waveform. Her ABI in our clinic today was 1.3 on the right. 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 ?She had a foot gastrocnemius recession sometime in the fall 2021 ?2/21; patient arrives today with a wound measuring slightly smaller 3 x 3 mm however there is circumferential area of erythema around this. She does not ?have any complaints ?2/28; plantar wound on the right first transmetatarsal amputation site. Small oval-shaped wound raised skin around the edges. We have been using Hydrofera ?Blue. She does not have arterial issues ?06/23/2020 upon evaluation today patient appears to be doing decently well. With regard to her wound. There is little bit of slough and some hyper granulation. I ?do not feel like the PolyMem did too well for her. In fact she may do better with some Hydrofera Blue. I know we just switch from that but I feel like that may ?be a better way to continue currently. ?3/15; this is a patient with a wound on the plantar right transmetatarsal amputation site probably the second metatarsal head not 1 July as stated previously we ?have been using polymen switch to [facility] last week. Wound looks about the same to me. We are going to start her on a total contact cast today we ?have the boot to fit ?3/18; first total contact cast change she tolerated this well ?3/31; she comes back in with out total contact cast on for 10 days. In spite of this the small wound over the third metatarsal head of her right transmetatarsal ?amputation site almost looks the same slightly hyper granulated. Circumference  is not well adhered. We have been using Hydrofera Blue ?4/7 she comes back in in her total contact cast. The wound  is smaller epithelializing and looks like it on its way to closing. We have been using Hydrofera Blue ?over the wound area ?She has a right transmetatarsal amputation. She tells [place] that she has been to [facility] in the past and they did a modified shoe for her however it ?did not keep this area epithelialized. She is going to bring that in next week for [place] to look at. It does not sound however that she was really offloading or ?padding this area at all ?4/14; total contact cast. I changed her to calcium alginate today. I think most of the small area is epithelialized although there is clearly not complete ?epithelialization and even the area that is epithelialized and the small wound is very tiny ?She shows me her shoe with the insert. It looks as though the insert probably was too large for her transmet it foot allowing it to move back and forward. I can ?see where the friction was in the blister she describes forming. I have asked her to take this back to [facility] to see if there is anything they can do ?4/21; patient presents for her 1 week follow-up. She has been treated with a total contact cast weekly. She has no complaints today. She denies any ?fever/chills. ?4/26; patient presents for 1 week follow-up in total contact cast change. Patient is having knee pain to her right knee due to chronic osteoarthritis. She is hoping ?to be able to wear her brace over the cast. ?5/10; the wound is closed over however still looks vulnerable. ?5/17; the wound remains closed surface looks better. She has a previous right transmet. She is going to convert this into a sneaker. She had a forefoot ?prosthesis for the foot but not sure that that are too much good she is supposed to have taken aback from modification however she can do that now. ?6/8; patient returns to our clinic for reopening of the plantar right foot transmetatarsal amputation site. She has slight warmth and erythema to the area.  She ?would like to try conservative wound care and not do the total contact cast that was she was doing before at this time ?10/01/2020 upon evaluation today patient appears to be doing okay in regard to her foot ulcer. Fortunately there does not appear to be any signs of active ?infection which is great news and overall very pleased with where things stand today. The wound does appear to be doing well and overall appearance wise is ?significantly improved compared to last week in my opinion based on my review of her pictures today. Overall the patient tells me that she is not having any ?significant pain which is great news as well. ?Readmission 07/23/2021 ?Ms. 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 This is a reoccurring wound for the patient. She has been following with Dr. Earleen Newport for this issue. She had a wound culture that grew Staph aureus sensitive to Bactrim and she is currently on this. She has been using Silvadene to the ?wound bed. She has a cam walker boot and an offloading scooter that she uses. She currently denies signs of infection. ?4/13; this is a  patient that is a type II diabetic complicated by peripheral neuropathy. She has had a previous transmetatarsal amputation of the right foot. She ?has a new wound on the plantar right foot between the first and second met heads probably close to the lateral part of the first met head. Generally small clean ?wound however from 7-9 o'clock there is a probing area close to bone. She finished a course of trimethoprim/sulfamethoxazole yesterday for methicillin- ?resistant Staph aureus in the wound. I believe this was ordered by podiatry she has been using a Dakin's wet-to-dry dressing. She uses a cam boot and a knee ?scooter. She says she is religious with  this ?4/20; patient presents for follow-up. She continues to use Dakin's wet-to-dry dressings without issues. She states she would like to try the total contact cast ?that she has had this in the past. She currently deni

## 2021-08-10 ENCOUNTER — Encounter (HOSPITAL_BASED_OUTPATIENT_CLINIC_OR_DEPARTMENT_OTHER): Payer: Medicare Other | Admitting: Internal Medicine

## 2021-08-10 DIAGNOSIS — L97512 Non-pressure chronic ulcer of other part of right foot with fat layer exposed: Secondary | ICD-10-CM

## 2021-08-10 DIAGNOSIS — E1142 Type 2 diabetes mellitus with diabetic polyneuropathy: Secondary | ICD-10-CM | POA: Diagnosis not present

## 2021-08-10 DIAGNOSIS — E11621 Type 2 diabetes mellitus with foot ulcer: Secondary | ICD-10-CM

## 2021-08-10 DIAGNOSIS — G9009 Other idiopathic peripheral autonomic neuropathy: Secondary | ICD-10-CM | POA: Diagnosis not present

## 2021-08-10 DIAGNOSIS — Z89431 Acquired absence of right foot: Secondary | ICD-10-CM | POA: Diagnosis not present

## 2021-08-10 NOTE — Progress Notes (Signed)
[name] [name] ([id number]) ?Visit Report for 08/10/2021 ?Arrival Information Details ?Patient Name: Date of Service: ?[name], ZULAUF D. 08/10/2021 8:45 A M ?Medical Record Number: [id number] ?Patient Account Number: [id number] ?Date of Birth/Sex: Treating RN: ?10/13/1965 (56 y.o. [name] [name] ?Primary Care [name] [name]: [name] [name] Other Clinician: ?Referring [name] [name]: ?Treating [name] [name]/Extender: [name] [name] ?[name] [name] ?Weeks in Treatment: 2 ?Visit Information History Since Last Visit ?Added or deleted any medications: No ?Patient Arrived: Knee Scooter ?Any new allergies or adverse reactions: No ?Arrival Time: 08:44 ?Had a fall or experienced change in No ?Transfer Assistance: None ?activities of daily living that may affect ?Patient Identification Verified: Yes ?risk of falls: ?Secondary Verification Process Completed: Yes ?Signs or symptoms of abuse/neglect since last No ?Patient Requires Transmission-Based Precautions: No visito ?Patient Has Alerts: No ?Hospitalized since last visit: No ?Implantable device outside of the clinic No ?excluding ?cellular tissue based products placed in the ?center ?since last visit: ?Has Dressing in Place as Prescribed: Yes ?Has Footwear/Offloading in Place as Yes ?Prescribed: ?Right: Removable Cast Walker/Walking Boot ?Pain Present Now: No ?Electronic Signature(s) ?Signed: 08/10/2021 4:57:13 PM By: [name] [name] ?Entered By: [name] [name] on 08/10/2021 08:50:57 ?-------------------------------------------------------------------------------- ?Encounter Discharge Information Details ?Patient Name: Date of Service: ?[name], TRAEGER D. 08/10/2021 8:45 A M ?Medical Record Number: [id number] ?Patient Account Number: [id number] ?Date of Birth/Sex: Treating RN: ?03-02-1966 (56 y.o. [name] [name] ?Primary Care Gurneet Matarese: [name] [name] Other Clinician: ?Referring Veron Senner: ?Treating Blayre Papania/Extender: [name] [name] ?[name] [name] ?Weeks in Treatment: 2 ?Encounter  Discharge Information Items ?Discharge Condition: Stable ?Ambulatory Status: Knee Scooter ?Discharge Destination: Home ?Transportation: Private Auto ?Schedule Follow-up Appointment: Yes ?Clinical Summary of Care: Provided on 08/10/2021 ?Form Type Recipient ?Paper Patient Patient ?Electronic Signature(s) ?Signed: 08/10/2021 4:57:13 PM By: [name] [name] ?Entered By: [name] [name] on 08/10/2021 09:31:13 ?-------------------------------------------------------------------------------- ?Lower Extremity Assessment Details ?Patient Name: ?Date of Service: ?[name], FRESHOUR D. 08/10/2021 8:45 A M ?Medical Record Number: [id number] ?Patient Account Number: [id number] ?Date of Birth/Sex: ?Treating RN: ?10/29/1965 (56 y.o. [name] [name] ?Primary Care Taiwo Fish: [name] [name] ?Other Clinician: ?Referring Icel Castles: ?Treating Capucine Tryon/Extender: [name] [name] ?[name] [name] ?Weeks in Treatment: 2 ?Edema Assessment ?Assessed: [Left: No] [Right: Yes] ?Edema: [Left: N] [Right: o] ?Calf ?Left: Right: ?Point of Measurement: 26 cm From Medial Instep 37 cm ?Ankle ?Left: Right: ?Point of Measurement: 10 cm From Medial Instep 23 cm ?Vascular Assessment ?Pulses: ?Dorsalis Pedis ?Palpable: [Right:Yes] ?Electronic Signature(s) ?Signed: 08/10/2021 4:57:13 PM By: [name] [name] ?Entered By: [name] [name] on 08/10/2021 08:50:45 ?-------------------------------------------------------------------------------- ?Multi Wound Chart Details ?Patient Name: ?Date of Service: ?[name], BUTSCH D. 08/10/2021 8:45 A M ?Medical Record Number: [id number] ?Patient Account Number: [id number] ?Date of Birth/Sex: ?Treating RN: ?Feb 23, 1966 (56 y.o. [name] [name] ?Primary Care Lekeya Rollings: [name] [name] ?Other Clinician: ?Referring Jabes Primo: ?Treating Hillary Schwegler/Extender: [name] [name] ?[name] [name] ?Weeks in Treatment: 2 ?Vital Signs ?Height(in): 64 ?Pulse(bpm): 84 ?Weight(lbs): 228 ?Blood Pressure(mmHg): 121/80 ?Body Mass Index(BMI):  39.1 ?Temperature(??F): 97.8 ?Respiratory Rate(breaths/min): 16 ?Photos: [2:Right, Plantar Foot] [N/A:N/A N/A] ?Wound Location: [2:Gradually Appeared] [N/A:N/A] ?Wounding Event: [2:Neuropathic Ulcer-Non Diabetic] [N/A:N/A] ?Primary Etiology: [2:Asthma, Sleep Apnea, Hypertension, N/A] ?Comorbid History: [2:Osteoarthritis, Neuropathy, Seizure Disorder 07/12/2021] [N/A:N/A] ?Date Acquired: [2:2] [N/A:N/A] ?Weeks of Treatment: [2:Open] [N/A:N/A] ?Wound Status: [2:No] [N/A:N/A] ?Wound Recurrence: [2:1.1x1x0.9] [N/A:N/A] ?Measurements L x W x D (cm) [2:0.864] [N/A:N/A] ?A (cm?) : ?rea [2:0.778] [N/A:N/A] ?Volume (cm?) : [2:8.30%] [N/A:N/A] ?% Reduction in Area: [2:31.20%] [N/A:N/A] ?% Reduction in Volume: [2:Full Thickness Without Exposed] [N/A:N/A] ?Classification: [2:Support Structures Medium] [N/A:N/A] ?Exudate Amount: [2:Serosanguineous] [N/A:N/A] ?Exudate Type: [2:red, brown] [N/A:N/A] ?Exudate Color: [2:Well defined, not attached] [  N/A:N/A] ?Wound Margin: [2:Large (67-100%)] [N/A:N/A] ?Granulation Amount: [2:Red, Pink, Hyper-granulation] [N/A:N/A] ?Granulation Quality: [2:None Present (0%)] [N/A:N/A] ?Necrotic Amount: ?[2:Fat Layer (Subcutaneous Tissue): Yes N/A] ?Exposed Structures: ?[2:Fascia: No Tendon: No Muscle: No Joint: No Bone: No Small (1-33%)] [N/A:N/A] ?Epithelialization: [2:T Contact Cast otal] [N/A:N/A] ?Treatment Notes ?Electronic Signature(s) ?Signed: 08/10/2021 9:54:59 AM By: [name] Shan DO ?Signed: 08/10/2021 4:57:13 PM By: [name] [name] ?Entered By: [name] [name] on 08/10/2021 09:06:51 ?-------------------------------------------------------------------------------- ?Multi-Disciplinary Care Plan Details ?Patient Name: ?Date of Service: ?[name], [name] 08/10/2021 8:45 A M ?Medical Record Number: [id number] ?Patient Account Number: [id number] ?Date of Birth/Sex: ?Treating RN: ?08-16-1965 (56 y.o. [name] [name] ?Primary Care Zyanya Glaza: [name] [name] ?Other Clinician: ?Referring  Yuma Blucher: ?Treating Maryagnes Carrasco/Extender: [name] [name] ?[name] [name] ?Weeks in Treatment: 2 ?Active Inactive ?Wound/Skin Impairment ?Nursing Diagnoses: ?Impaired tissue integrity ?Goals: ?Patient/caregiver will verbalize understanding of skin care regimen ?Date Initiated: 07/23/2021 ?Target Resolution Date: 08/20/2021 ?Goal Status: Active ?Ulcer/skin breakdown will have a volume reduction of 30% by week 4 ?Date Initiated: 07/23/2021 ?Target Resolution Date: 08/20/2021 ?Goal Status: Active ?Interventions: ?Assess patient/caregiver ability to obtain necessary supplies ?Assess patient/caregiver ability to perform ulcer/skin care regimen upon admission and as needed ?Assess ulceration(s) every visit ?Provide education on ulcer and skin care ?Treatment Activities: ?Topical wound management initiated : 07/23/2021 ?Notes: ?Electronic Signature(s) ?Signed: 08/10/2021 4:57:13 PM By: [name] [name] ?Entered By: [name] [name] on 08/10/2021 08:44:23 ?-------------------------------------------------------------------------------- ?Pain Assessment Details ?Patient Name: ?Date of Service: ?[name], BRIGGS D. 08/10/2021 8:45 A M ?Medical Record Number: [id number] ?Patient Account Number: [id number] ?Date of Birth/Sex: ?Treating RN: ?1965/09/02 (56 y.o. [name] [name] ?Primary Care Aras Albarran: [name] [name] ?Other Clinician: ?Referring Dameka Younker: ?Treating Ethelle Ola/Extender: [name] [name] ?[name] [name] ?Weeks in Treatment: 2 ?Active Problems ?Location of Pain Severity and Description of Pain ?Patient Has Paino No ?Site Locations ?Pain Management and Medication ?Current Pain Management: ?Electronic Signature(s) ?Signed: 08/10/2021 4:57:13 PM By: [name] [name] ?Entered By: [name] [name] on 08/10/2021 08:50:25 ?-------------------------------------------------------------------------------- ?Patient/Caregiver Education Details ?Patient Name: ?Date of Service: ?[name], Reeya D. 4/24/2023andnbsp8:45 A M ?Medical Record Number:  [id number] ?Patient Account Number: [id number] ?Date of Birth/Gender: ?Treating RN: ?1966/04/14 (56 y.o. [name] [name] ?Primary Care Physician: [name] [name] ?Other Clinician: ?Referring Physician: ?Treating Physician/Extender: Jacquelin Hawking

## 2021-08-10 NOTE — Progress Notes (Signed)
[name] [name], [name] [name] ([id number]) ?Visit Report for 08/10/2021 ?Chief Complaint Document Details ?Patient Name: Date of Service: ?[name] [name], [name] D. 08/10/2021 8:45 A M ?Medical Record Number: [id number] ?Patient Account Number: [id number] ?Date of Birth/Sex: Treating RN: ?04-May-1965 (56 y.o. [name] [name] ?Primary Care Provider: Jenna Luo Other Clinician: ?Referring Provider: ?Treating Provider/Extender: Kalman Shan ?Jenna Luo ?Weeks in Treatment: 2 ?Information Obtained from: Patient ?Chief Complaint ?06/03/2019; patient is here for review of a wound on her plantar foot transmetatarsal amputation site at roughly the third met head ?07/23/2021; patient presents for right plantar foot wound ?Electronic Signature(s) ?Signed: 08/10/2021 9:54:59 AM By: Kalman Shan DO ?Entered By: Kalman Shan on 08/10/2021 09:07:09 ?-------------------------------------------------------------------------------- ?HPI Details ?Patient Name: Date of Service: ?[name] [name], [name] D. 08/10/2021 8:45 A M ?Medical Record Number: [id number] ?Patient Account Number: [id number] ?Date of Birth/Sex: Treating RN: ?1966-02-01 (56 y.o. [name] [name] ?Primary Care Provider: Jenna Luo Other Clinician: ?Referring Provider: ?Treating Provider/Extender: Kalman Shan ?Jenna Luo ?Weeks in Treatment: 2 ?History of Present Illness ?HPI Description: ADMISSION ?06/02/2020 ?This is a 56 year old woman who has had problems with wounds on her predominantly plantar right foot for quite a period of time. 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 More recently she has had an area ?developed in the right mid foot at roughly the third metatarsal head. She says this started as a blister of November. Its not been closing. More recently she ?has been using Silvadene cream she has a cam boot to offload. I note she walks with a  cane ?The patient has had arterial studies done in 2019 at which time her ABI in the right was 1.28 with triphasic waveforms noncompressible on the left with triphasic ?waveform. Her ABI in our clinic today was 1.3 on the right. 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 ?She had a foot gastrocnemius recession sometime in the fall 2021 ?2/21; patient arrives today with a wound measuring slightly smaller 3 x 3 mm however there is circumferential area of erythema around this. She does not ?have any complaints ?2/28; plantar wound on the right first transmetatarsal amputation site. Small oval-shaped wound raised skin around the edges. We have been using Hydrofera ?Blue. She does not have arterial issues ?06/23/2020 upon evaluation today patient appears to be doing decently well. With regard to her wound. There is little bit of slough and some hyper granulation. I ?do not feel like the PolyMem did too well for her. In fact she may do better with some Hydrofera Blue. I know we just switch from that but I feel like that may ?be a better way to continue currently. ?3/15; this is a patient with a wound on the plantar right transmetatarsal amputation site probably the second metatarsal head not 1 July as stated previously we ?have been using polymen switch to [facility] last week. Wound looks about the same to me. We are going to start her on a total contact cast today we ?have the boot to fit ?3/18; first total contact cast change she tolerated this well ?3/31; she comes back in with out total contact cast on for 10 days. In spite of this the small wound over the third metatarsal head of her right transmetatarsal ?amputation site almost looks the same slightly hyper granulated. Circumference  is not well adhered. We have been using Hydrofera Blue ?4/7 she comes back in in her total contact cast. The wound  is smaller epithelializing and looks like it on its way to closing. We have been using Hydrofera Blue ?over the wound area ?She has a right transmetatarsal amputation. She tells [place] that she has been to [facility] in the past and they did a modified shoe for her however it ?did not keep this area epithelialized. She is going to bring that in next week for [place] to look at. It does not sound however that she was really offloading or ?padding this area at all ?4/14; total contact cast. I changed her to calcium alginate today. I think most of the small area is epithelialized although there is clearly not complete ?epithelialization and even the area that is epithelialized and the small wound is very tiny ?She shows me her shoe with the insert. It looks as though the insert probably was too large for her transmet it foot allowing it to move back and forward. I can ?see where the friction was in the blister she describes forming. I have asked her to take this back to [facility] to see if there is anything they can do ?4/21; patient presents for her 1 week follow-up. She has been treated with a total contact cast weekly. She has no complaints today. She denies any ?fever/chills. ?4/26; patient presents for 1 week follow-up in total contact cast change. Patient is having knee pain to her right knee due to chronic osteoarthritis. She is hoping ?to be able to wear her brace over the cast. ?5/10; the wound is closed over however still looks vulnerable. ?5/17; the wound remains closed surface looks better. She has a previous right transmet. She is going to convert this into a sneaker. She had a forefoot ?prosthesis for the foot but not sure that that are too much good she is supposed to have taken aback from modification however she can do that now. ?6/8; patient returns to our clinic for reopening of the plantar right foot transmetatarsal amputation site. She has slight warmth and erythema to the area.  She ?would like to try conservative wound care and not do the total contact cast that was she was doing before at this time ?10/01/2020 upon evaluation today patient appears to be doing okay in regard to her foot ulcer. Fortunately there does not appear to be any signs of active ?infection which is great news and overall very pleased with where things stand today. The wound does appear to be doing well and overall appearance wise is ?significantly improved compared to last week in my opinion based on my review of her pictures today. Overall the patient tells me that she is not having any ?significant pain which is great news as well. ?Readmission 07/23/2021 ?Ms. 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 This is a reoccurring wound for the patient. She has been following with Dr. Earleen Newport for this issue. She had a wound culture that grew Staph aureus sensitive to Bactrim and she is currently on this. She has been using Silvadene to the ?wound bed. She has a cam walker boot and an offloading scooter that she uses. She currently denies signs of infection. ?4/13; this is a  patient that is a type II diabetic complicated by peripheral neuropathy. She has had a previous transmetatarsal amputation of the right foot. She ?has a new wound on the plantar right foot between the first and second met heads probably close to the lateral part of the first met head. Generally small clean ?wound however from 7-9 o'clock there is a probing area close to bone. She finished a course of trimethoprim/sulfamethoxazole yesterday for methicillin- ?resistant Staph aureus in the wound. I believe this was ordered by podiatry she has been using a Dakin's wet-to-dry dressing. She uses a cam boot and a knee ?scooter. She says she is religious with  this ?4/20; patient presents for follow-up. She continues to use Dakin's wet-to-dry dressings without issues. She states she would like to try the total contact cast ?that she has had this in the past. She currently denies

## 2021-08-12 ENCOUNTER — Encounter (HOSPITAL_BASED_OUTPATIENT_CLINIC_OR_DEPARTMENT_OTHER): Payer: Medicare Other | Admitting: Physician Assistant

## 2021-08-12 DIAGNOSIS — E11621 Type 2 diabetes mellitus with foot ulcer: Secondary | ICD-10-CM | POA: Diagnosis not present

## 2021-08-12 DIAGNOSIS — Z89431 Acquired absence of right foot: Secondary | ICD-10-CM | POA: Diagnosis not present

## 2021-08-12 DIAGNOSIS — G9009 Other idiopathic peripheral autonomic neuropathy: Secondary | ICD-10-CM | POA: Diagnosis not present

## 2021-08-12 DIAGNOSIS — E1142 Type 2 diabetes mellitus with diabetic polyneuropathy: Secondary | ICD-10-CM | POA: Diagnosis not present

## 2021-08-12 DIAGNOSIS — L97512 Non-pressure chronic ulcer of other part of right foot with fat layer exposed: Secondary | ICD-10-CM | POA: Diagnosis not present

## 2021-08-12 NOTE — Progress Notes (Addendum)
AHLIYAH, NIENOW ([id number]) ?Visit Report for 08/12/2021 ?Chief Complaint Document Details ?Patient Name: Date of Service: ?BETSI, CRESPI D. 08/12/2021 2:45 PM ?Medical Record Number: [id number] ?Patient Account Number: [id number] ?Date of Birth/Sex: Treating RN: ?Apr 21, 1965 (56 y.o. F) ?Primary Care Provider: Jenna Luo Other Clinician: ?Referring Provider: ?Treating Provider/Extender: Worthy Keeler ?Jenna Luo ?Weeks in Treatment: 2 ?Information Obtained from: Patient ?Chief Complaint ?06/03/2019; patient is here for review of a wound on her plantar foot transmetatarsal amputation site at roughly the third met head ?07/23/2021; patient presents for right plantar foot wound ?Electronic Signature(s) ?Signed: 08/12/2021 2:32:46 PM By: Worthy Keeler PA-C ?Entered By: Worthy Keeler on 08/12/2021 14:32:46 ?-------------------------------------------------------------------------------- ?HPI Details ?Patient Name: Date of Service: ?CANDELA, KRUL D. 08/12/2021 2:45 PM ?Medical Record Number: [id number] ?Patient Account Number: [id number] ?Date of Birth/Sex: Treating RN: ?Mar 19, 1966 (56 y.o. F) ?Primary Care Provider: Jenna Luo Other Clinician: ?Referring Provider: ?Treating Provider/Extender: Worthy Keeler ?Jenna Luo ?Weeks in Treatment: 2 ?History of Present Illness ?HPI Description: ADMISSION ?06/02/2020 ?This is a 56 year old woman who has had problems with wounds on her predominantly plantar right foot for quite a period of time. 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 More recently she has had an area ?developed in the right mid foot at roughly the third metatarsal head. She says this started as a blister of November. Its not been closing. More recently she ?has been using Silvadene cream she has a cam boot to offload. I note she walks with a cane ?The patient has had arterial  studies done in 2019 at which time her ABI in the right was 1.28 with triphasic waveforms noncompressible on the left with triphasic ?waveform. Her ABI in our clinic today was 1.3 on the right. 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 ?She had a foot gastrocnemius recession sometime in the fall 2021 ?2/21; patient arrives today with a wound measuring slightly smaller 3 x 3 mm however there is circumferential area of erythema around this. She does not ?have any complaints ?2/28; plantar wound on the right first transmetatarsal amputation site. Small oval-shaped wound raised skin around the edges. We have been using Hydrofera ?Blue. She does not have arterial issues ?06/23/2020 upon evaluation today patient appears to be doing decently well. With regard to her wound. There is little bit of slough and some hyper granulation. I ?do not feel like the PolyMem did too well for her. In fact she may do better with some Hydrofera Blue. I know we just switch from that but I feel like that may ?be a better way to continue currently. ?3/15; this is a patient with a wound on the plantar right transmetatarsal amputation site probably the second metatarsal head not 1 July as stated previously we ?have been using polymen switch to [facility] last week. Wound looks about the same to me. We are going to start her on a total contact cast today we ?have the boot to fit ?3/18; first total contact cast change she tolerated this well ?3/31; she comes back in with out total contact cast on for 10 days. In spite of this the small wound over the third metatarsal head of her right transmetatarsal ?amputation site almost looks the same slightly hyper granulated. Circumference is not  well adhered. We have been using Hydrofera Blue ?4/7 she comes back in in her total contact cast. The wound is smaller epithelializing and looks  like it on its way to closing. We have been using Hydrofera Blue ?over the wound area ?She has a right transmetatarsal amputation. She tells [place] that she has been to [facility] in the past and they did a modified shoe for her however it ?did not keep this area epithelialized. She is going to bring that in next week for [place] to look at. It does not sound however that she was really offloading or ?padding this area at all ?4/14; total contact cast. I changed her to calcium alginate today. I think most of the small area is epithelialized although there is clearly not complete ?epithelialization and even the area that is epithelialized and the small wound is very tiny ?She shows me her shoe with the insert. It looks as though the insert probably was too large for her transmet it foot allowing it to move back and forward. I can ?see where the friction was in the blister she describes forming. I have asked her to take this back to [facility] to see if there is anything they can do ?4/21; patient presents for her 1 week follow-up. She has been treated with a total contact cast weekly. She has no complaints today. She denies any ?fever/chills. ?4/26; patient presents for 1 week follow-up in total contact cast change. Patient is having knee pain to her right knee due to chronic osteoarthritis. She is hoping ?to be able to wear her brace over the cast. ?5/10; the wound is closed over however still looks vulnerable. ?5/17; the wound remains closed surface looks better. She has a previous right transmet. She is going to convert this into a sneaker. She had a forefoot ?prosthesis for the foot but not sure that that are too much good she is supposed to have taken aback from modification however she can do that now. ?6/8; patient returns to our clinic for reopening of the plantar right foot transmetatarsal amputation site. She has slight warmth and erythema to the area. She ?would like to try conservative wound  care and not do the total contact cast that was she was doing before at this time ?10/01/2020 upon evaluation today patient appears to be doing okay in regard to her foot ulcer. Fortunately there does not appear to be any signs of active ?infection which is great news and overall very pleased with where things stand today. The wound does appear to be doing well and overall appearance wise is ?significantly improved compared to last week in my opinion based on my review of her pictures today. Overall the patient tells me that she is not having any ?significant pain which is great news as well. ?Readmission 07/23/2021 ?Ms. 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 This is a reoccurring wound for the patient. She has been following with Dr. Earleen Newport for this issue. She had a wound culture that grew Staph aureus sensitive to Bactrim and she is currently on this. She has been using Silvadene to the ?wound bed. She has a cam walker boot and an offloading scooter that she uses. She currently denies signs of infection. ?4/13; this is a patient that  is a type II diabetic complicated by peripheral neuropathy. She has had a previous transmetatarsal amputation of the right foot. She ?has a new wound on the plantar right foot between the first and second met heads probably close to the lateral part of the first met head. Generally small clean ?wound however from 7-9 o'clock there is a probing area close to bone. She finished a course of trimethoprim/sulfamethoxazole yesterday for methicillin- ?resistant Staph aureus in the wound. I believe this was ordered by podiatry she has been using a Dakin's wet-to-dry dressing. She uses a cam boot and a knee ?scooter. She says she is religious with this ?4/20; patient presents for follow-up. She  continues to use Dakin's wet-to-dry dressings without issues. She states she would like to try the total contact cast ?that she has had this in the past. She currently denies signs of infection. ?4/24; patien

## 2021-08-12 NOTE — Progress Notes (Signed)
RACHAL, DVORSKY ([id number]) ?Visit Report for 08/12/2021 ?Arrival Information Details ?Patient Name: Date of Service: ?EVI, MCCOMB D. 08/12/2021 2:45 PM ?Medical Record Number: [id number] ?Patient Account Number: [id number] ?Date of Birth/Sex: Treating RN: ?04/29/1965 (56 y.o. [name] ?Primary Care Travor Royce: Jenna Luo Other Clinician: ?Referring Courtney Bellizzi: ?Treating Gilda Abboud/Extender: Worthy Keeler ?Jenna Luo ?Weeks in Treatment: 2 ?Visit Information History Since Last Visit ?Added or deleted any medications: No ?Patient Arrived: [name] ?Any new allergies or adverse reactions: No ?Arrival Time: 14:13 ?Had a fall or experienced change in No ?Transfer Assistance: None ?activities of daily living that may affect ?Patient Identification Verified: Yes ?risk of falls: ?Secondary Verification Process Completed: Yes ?Signs or symptoms of abuse/neglect since last visito No ?Patient Requires Transmission-Based Precautions: No ?Hospitalized since last visit: No ?Patient Has Alerts: No ?Implantable device outside of the clinic excluding No ?cellular tissue based products placed in the center ?since last visit: ?Has Dressing in Place as Prescribed: Yes ?Has Footwear/Offloading in Place as Prescribed: Yes ?Right: T Contact Cast ?otal ?Pain Present Now: No ?Electronic Signature(s) ?Signed: 08/12/2021 4:05:30 PM By: Lorrin Jackson ?Entered By: Lorrin Jackson on 08/12/2021 14:14:22 ?-------------------------------------------------------------------------------- ?Encounter Discharge Information Details ?Patient Name: Date of Service: ?GWENDOLA, HORNADAY D. 08/12/2021 2:45 PM ?Medical Record Number: [id number] ?Patient Account Number: [id number] ?Date of Birth/Sex: Treating RN: ?May 26, 1965 (56 y.o. [name] ?Primary Care Xyon Lukasik: Jenna Luo Other Clinician: ?Referring Facundo Allemand: ?Treating Imogen Maddalena/Extender: Worthy Keeler ?Jenna Luo ?Weeks in Treatment: 2 ?Encounter Discharge Information  Items ?Discharge Condition: Stable ?Ambulatory Status: Ambulatory ?Discharge Destination: Home ?Transportation: Private Auto ?Schedule Follow-up Appointment: Yes ?Clinical Summary of Care: Provided on 08/12/2021 ?Form Type Recipient ?Paper Patient Patient ?Electronic Signature(s) ?Signed: 08/12/2021 2:51:31 PM By: Lorrin Jackson ?Entered By: Lorrin Jackson on 08/12/2021 14:51:31 ?-------------------------------------------------------------------------------- ?Lower Extremity Assessment Details ?Patient Name: ?Date of Service: ?RAELA, BOHL D. 08/12/2021 2:45 PM ?Medical Record Number: [id number] ?Patient Account Number: [id number] ?Date of Birth/Sex: ?Treating RN: ?July 04, 1965 (56 y.o. [name] ?Primary Care Moria Brophy: Jenna Luo ?Other Clinician: ?Referring Amesha Bailey: ?Treating Jaedan Schuman/Extender: Worthy Keeler ?Jenna Luo ?Weeks in Treatment: 2 ?Edema Assessment ?Assessed: [Left: No] [Right: Yes] ?Edema: [Left: N] [Right: o] ?Calf ?Left: Right: ?Point of Measurement: 26 cm From Medial Instep 37 cm ?Ankle ?Left: Right: ?Point of Measurement: 10 cm From Medial Instep 23 cm ?Vascular Assessment ?Pulses: ?Dorsalis Pedis ?Palpable: [Right:Yes] ?Electronic Signature(s) ?Signed: 08/12/2021 4:05:30 PM By: Lorrin Jackson ?Entered By: Lorrin Jackson on 08/12/2021 14:14:47 ?-------------------------------------------------------------------------------- ?Multi-Disciplinary Care Plan Details ?Patient Name: ?Date of Service: ?ELEONOR, OCON D. 08/12/2021 2:45 PM ?Medical Record Number: [id number] ?Patient Account Number: [id number] ?Date of Birth/Sex: ?Treating RN: ?07/23/1965 (56 y.o. [name] ?Primary Care Trevonn Hallum: Jenna Luo ?Other Clinician: ?Referring Donzella Carrol: ?Treating Khaliyah Northrop/Extender: Worthy Keeler ?Jenna Luo ?Weeks in Treatment: 2 ?Active Inactive ?Wound/Skin Impairment ?Nursing Diagnoses: ?Impaired tissue integrity ?Goals: ?Patient/caregiver will verbalize understanding of skin  care regimen ?Date Initiated: 07/23/2021 ?Target Resolution Date: 08/20/2021 ?Goal Status: Active ?Ulcer/skin breakdown will have a volume reduction of 30% by week 4 ?Date Initiated: 07/23/2021 ?Target Resolution Date: 08/20/2021 ?Goal Status: Active ?Interventions: ?Assess patient/caregiver ability to obtain necessary supplies ?Assess patient/caregiver ability to perform ulcer/skin care regimen upon admission and as needed ?Assess ulceration(s) every visit ?Provide education on ulcer and skin care ?Treatment Activities: ?Topical wound management initiated : 07/23/2021 ?Notes: ?Electronic Signature(s) ?Signed: 08/12/2021 4:05:30 PM By: Lorrin Jackson ?Entered By: Lorrin Jackson on 08/12/2021 14:23:55 ?-------------------------------------------------------------------------------- ?Pain Assessment Details ?Patient Name: ?Date of Service: ?LAURELLA, TULL D. 08/12/2021 2:45 PM ?Medical Record Number: [id number] ?  Patient Account Number: [id number] ?Date of Birth/Sex: ?Treating RN: ?[birth year] (56 y.o. [name] ?Primary Care Deneise Getty: Jenna Luo ?Other Clinician: ?Referring Erasmo Vertz: ?Treating Josanna Hefel/Extender: Worthy Keeler ?Jenna Luo ?Weeks in Treatment: 2 ?Active Problems ?Location of Pain Severity and Description of Pain ?Patient Has Paino No ?Site Locations ?Pain Management and Medication ?Current Pain Management: ?Electronic Signature(s) ?Signed: 08/12/2021 4:05:30 PM By: Lorrin Jackson ?Entered By: Lorrin Jackson on 08/12/2021 14:14:34 ?-------------------------------------------------------------------------------- ?Patient/Caregiver Education Details ?Patient Name: ?Date of Service: ?AUTUMNE, KALLIO 4/26/2023andnbsp2:45 PM ?Medical Record Number: [id number] ?Patient Account Number: [id number] ?Date of Birth/Gender: ?Treating RN: ?Jul 11, 1965 (56 y.o. [name] ?Primary Care Physician: Jenna Luo ?Other Clinician: ?Referring Physician: ?Treating Physician/Extender: Worthy Keeler ?Jenna Luo ?Weeks in Treatment: 2 ?Education Assessment ?Education Provided To: ?Patient ?Education Topics Provided ?Offloading: ?Methods: Explain/Verbal, Printed ?Responses: State content correctly ?Wound/Skin Impairment: ?Methods: Explain/Verbal, Printed ?Responses: State content correctly ?Electronic Signature(s) ?Signed: 08/12/2021 4:05:30 PM By: Lorrin Jackson ?Entered By: Lorrin Jackson on 08/12/2021 14:50:02 ?-------------------------------------------------------------------------------- ?Wound Assessment Details ?Patient Name: ?Date of Service: ?PARNIKA, TWETEN D. 08/12/2021 2:45 PM ?Medical Record Number: [id number] ?Patient Account Number: [id number] ?Date of Birth/Sex: ?Treating RN: ?March 17, 1966 (56 y.o. [name] ?Primary Care Rajean Desantiago: Jenna Luo ?Other Clinician: ?Referring Dub Maclellan: ?Treating Kyan Yurkovich/Extender: Worthy Keeler ?Jenna Luo ?Weeks in Treatment: 2 ?Wound Status ?Wound Number: 2 Primary Neuropathic Ulcer-Non Diabetic ?Etiology: ?Wound Location: Right, Plantar Foot ?Wound Open ?Wounding Event: Gradually Appeared ?Status: ?Date Acquired: 07/12/2021 ?Comorbid Asthma, Sleep Apnea, Hypertension, Osteoarthritis, ?Weeks Of Treatment: 2 ?History: Neuropathy, Seizure Disorder ?Clustered Wound: No ?Wound Measurements ?Length: (cm) 1.1 ?Width: (cm) 1 ?Depth: (cm) 0.9 ?Area: (cm?) 0.864 ?Volume: (cm?) 0.778 ?% Reduction in Area: 8.3% ?% Reduction in Volume: 31.2% ?Epithelialization: Small (1-33%) ?Tunneling: No ?Undermining: No ?Wound Description ?Classification: Full Thickness Without Exposed Support Structures ?Wound Margin: Well defined, not attached ?Exudate Amount: Medium ?Exudate Type: Serosanguineous ?Exudate Color: red, brown ?Foul Odor After Cleansing: No ?Slough/Fibrino No ?Wound Bed ?Granulation Amount: Large (67-100%) Exposed Structure ?Granulation Quality: Red, Pink, Hyper-granulation ?Fascia Exposed: No ?Necrotic Amount: None Present (0%) ?Fat Layer (Subcutaneous Tissue)  Exposed: Yes ?Tendon Exposed: No ?Muscle Exposed: No ?Joint Exposed: No ?Bone Exposed: No ?Treatment Notes ?Wound #2 (Foot) Wound Laterality: Plantar, Right ?Cleanser ?Soap and Water ?Discharge Instruction: May shower and

## 2021-08-17 ENCOUNTER — Encounter (HOSPITAL_BASED_OUTPATIENT_CLINIC_OR_DEPARTMENT_OTHER): Payer: Medicare Other | Attending: Internal Medicine | Admitting: Internal Medicine

## 2021-08-17 DIAGNOSIS — L97512 Non-pressure chronic ulcer of other part of right foot with fat layer exposed: Secondary | ICD-10-CM | POA: Diagnosis not present

## 2021-08-17 DIAGNOSIS — G4733 Obstructive sleep apnea (adult) (pediatric): Secondary | ICD-10-CM | POA: Diagnosis not present

## 2021-08-17 DIAGNOSIS — E11621 Type 2 diabetes mellitus with foot ulcer: Secondary | ICD-10-CM | POA: Insufficient documentation

## 2021-08-17 DIAGNOSIS — M1990 Unspecified osteoarthritis, unspecified site: Secondary | ICD-10-CM | POA: Insufficient documentation

## 2021-08-17 DIAGNOSIS — G9009 Other idiopathic peripheral autonomic neuropathy: Secondary | ICD-10-CM | POA: Insufficient documentation

## 2021-08-17 DIAGNOSIS — M869 Osteomyelitis, unspecified: Secondary | ICD-10-CM | POA: Diagnosis not present

## 2021-08-17 DIAGNOSIS — R531 Weakness: Secondary | ICD-10-CM | POA: Diagnosis not present

## 2021-08-17 DIAGNOSIS — L97511 Non-pressure chronic ulcer of other part of right foot limited to breakdown of skin: Secondary | ICD-10-CM | POA: Diagnosis not present

## 2021-08-17 NOTE — Progress Notes (Signed)
VERSIE, SOAVE ([id number]) ?Visit Report for 08/17/2021 ?Arrival Information Details ?Patient Name: Date of Service: ?[name] [name], [name] D. 08/17/2021 11:00 A M ?Medical Record Number: [id number] ?Patient Account Number: [id number] ?Date of Birth/Sex: Treating RN: ?[birth year] (56 y.o. F) Deaton, Bobbi ?Primary Care Kynadee Dam: Jenna Luo Other Clinician: ?Referring Ahlivia Salahuddin: ?Treating Abhimanyu Cruces/Extender: Kalman Shan ?Jenna Luo ?Weeks in Treatment: 3 ?Visit Information History Since Last Visit ?Added or deleted any medications: No ?Patient Arrived: [name] ?Any new allergies or adverse reactions: No ?Arrival Time: 10:53 ?Had a fall or experienced change in No ?Accompanied By: self ?activities of daily living that may affect ?Transfer Assistance: None ?risk of falls: ?Patient Identification Verified: Yes ?Signs or symptoms of abuse/neglect since last visito No ?Secondary Verification Process Completed: Yes ?Hospitalized since last visit: No ?Patient Requires Transmission-Based Precautions: No ?Implantable device outside of the clinic excluding No ?Patient Has Alerts: No ?cellular tissue based products placed in the center ?since last visit: ?Has Dressing in Place as Prescribed: Yes ?Has Footwear/Offloading in Place as Prescribed: Yes ?Right: T Contact Cast ?otal ?Pain Present Now: Yes ?Electronic Signature(s) ?Signed: 08/17/2021 6:01:58 PM By: Deon Pilling RN, BSN ?Entered By: Deon Pilling on 08/17/2021 10:54:12 ?-------------------------------------------------------------------------------- ?Encounter Discharge Information Details ?Patient Name: Date of Service: ?[name] [name], [name] D. 08/17/2021 11:00 A M ?Medical Record Number: [id number] ?Patient Account Number: [id number] ?Date of Birth/Sex: Treating RN: ?Oct 18, 1965 (56 y.o. [name] ?Primary Care Kensleigh Gates: Jenna Luo Other Clinician: ?Referring Gera Inboden: ?Treating Arriah Wadle/Extender: Kalman Shan ?Jenna Luo ?Weeks in Treatment: 3 ?Encounter  Discharge Information Items ?Discharge Condition: Stable ?Ambulatory Status: Ambulatory ?Discharge Destination: Home ?Transportation: Private Auto ?Schedule Follow-up Appointment: Yes ?Clinical Summary of Care: Provided on 08/17/2021 ?Form Type Recipient ?Paper Patient Patient ?Electronic Signature(s) ?Signed: 08/17/2021 11:48:39 AM By: Lorrin Jackson ?Entered By: Lorrin Jackson on 08/17/2021 11:48:39 ?-------------------------------------------------------------------------------- ?Lower Extremity Assessment Details ?Patient Name: ?Date of Service: ?[name] [name], [name] D. 08/17/2021 11:00 A M ?Medical Record Number: [id number] ?Patient Account Number: [id number] ?Date of Birth/Sex: ?Treating RN: ?1966/01/23 (56 y.o. F) Deaton, Bobbi ?Primary Care Tyronda Vizcarrondo: Jenna Luo ?Other Clinician: ?Referring Clenton Esper: ?Treating Taite Baldassari/Extender: Kalman Shan ?Jenna Luo ?Weeks in Treatment: 3 ?Edema Assessment ?Assessed: [Left: No] [Right: Yes] ?Edema: [Left: N] [Right: o] ?Calf ?Left: Right: ?Point of Measurement: 26 cm From Medial Instep 38 cm ?Ankle ?Left: Right: ?Point of Measurement: 10 cm From Medial Instep 23 cm ?Vascular Assessment ?Pulses: ?Dorsalis Pedis ?Palpable: [Right:Yes] ?Electronic Signature(s) ?Signed: 08/17/2021 6:01:58 PM By: Deon Pilling RN, BSN ?Entered By: Deon Pilling on 08/17/2021 11:04:53 ?-------------------------------------------------------------------------------- ?Multi Wound Chart Details ?Patient Name: ?Date of Service: ?[name] [name], [name] D. 08/17/2021 11:00 A M ?Medical Record Number: [id number] ?Patient Account Number: [id number] ?Date of Birth/Sex: ?Treating RN: ?1965-07-26 (56 y.o. [name] ?Primary Care Khelani Kops: Jenna Luo ?Other Clinician: ?Referring Lymon Kidney: ?Treating Tsugio Elison/Extender: Kalman Shan ?Jenna Luo ?Weeks in Treatment: 3 ?Vital Signs ?Height(in): 64 ?Pulse(bpm): 77 ?Weight(lbs): 228 ?Blood Pressure(mmHg): 121/81 ?Body Mass Index(BMI):  39.1 ?Temperature(??F): 98 ?Respiratory Rate(breaths/min): 20 ?Photos: [2:Right, Plantar Foot] [N/A:N/A N/A] ?Wound Location: [2:Gradually Appeared] [N/A:N/A] ?Wounding Event: [2:Neuropathic Ulcer-Non Diabetic] [N/A:N/A] ?Primary Etiology: [2:Asthma, Sleep Apnea, Hypertension, N/A] ?Comorbid History: [2:Osteoarthritis, Neuropathy, Seizure Disorder 07/12/2021] [N/A:N/A] ?Date Acquired: [2:3] [N/A:N/A] ?Weeks of Treatment: [2:Open] [N/A:N/A] ?Wound Status: [2:No] [N/A:N/A] ?Wound Recurrence: [2:1.1x1x0.5] [N/A:N/A] ?Measurements L x W x D (cm) [2:0.864] [N/A:N/A] ?A (cm?) : ?rea [2:0.432] [N/A:N/A] ?Volume (cm?) : [2:8.30%] [N/A:N/A] ?% Reduction in Area: [2:61.80%] [N/A:N/A] ?% Reduction in Volume: [2:Full Thickness Without Exposed] [N/A:N/A] ?Classification: [2:Support Structures Medium] [N/A:N/A] ?Exudate Amount: [2:Serosanguineous] [N/A:N/A] ?Exudate Type: [2:red, brown] [N/A:N/A] ?Exudate  Color: [2:Well defined, not attached] [N/A:N/A] ?Wound Margin: [2:Large (67-100%)] [N/A:N/A] ?Granulation Amount: [2:Red, Pink, Hyper-granulation] [N/A:N/A] ?Granulation Quality: [2:None Present (0%)] [N/A:N/A] ?Necrotic Amount: ?[2:Fat Layer (Subcutaneous Tissue): Yes N/A] ?Exposed Structures: ?[2:Fascia: No Tendon: No Muscle: No Joint: No Bone: No Small (1-33%)] [N/A:N/A] ?Epithelialization: [2:Chemical Cauterization] [N/A:N/A] ?Procedures Performed: [2:T Contact Cast otal] ?Treatment Notes ?Electronic Signature(s) ?Signed: 08/17/2021 11:48:17 AM By: Kalman Shan DO ?Signed: 08/17/2021 5:13:39 PM By: Lorrin Jackson ?Entered By: Kalman Shan on 08/17/2021 11:37:28 ?-------------------------------------------------------------------------------- ?Multi-Disciplinary Care Plan Details ?Patient Name: ?Date of Service: ?[name] [name], [name] D. 08/17/2021 11:00 A M ?Medical Record Number: [id number] ?Patient Account Number: [id number] ?Date of Birth/Sex: ?Treating RN: ?December 02, 1965 (56 y.o. [name] ?Primary Care Ivette Castronova: Jenna Luo ?Other Clinician: ?Referring Berania Peedin: ?Treating Lott Seelbach/Extender: Kalman Shan ?Jenna Luo ?Weeks in Treatment: 3 ?Active Inactive ?Wound/Skin Impairment ?Nursing Diagnoses: ?Impaired tissue integrity ?Goals: ?Patient/caregiver will verbalize understanding of skin care regimen ?Date Initiated: 07/23/2021 ?Target Resolution Date: 08/20/2021 ?Goal Status: Active ?Ulcer/skin breakdown will have a volume reduction of 30% by week 4 ?Date Initiated: 07/23/2021 ?Target Resolution Date: 08/20/2021 ?Goal Status: Active ?Interventions: ?Assess patient/caregiver ability to obtain necessary supplies ?Assess patient/caregiver ability to perform ulcer/skin care regimen upon admission and as needed ?Assess ulceration(s) every visit ?Provide education on ulcer and skin care ?Treatment Activities: ?Topical wound management initiated : 07/23/2021 ?Notes: ?Electronic Signature(s) ?Signed: 08/17/2021 5:13:39 PM By: Lorrin Jackson ?Entered By: Lorrin Jackson on 08/17/2021 11:22:04 ?-------------------------------------------------------------------------------- ?Pain Assessment Details ?Patient Name: ?Date of Service: ?[name] [name], [name] D. 08/17/2021 11:00 A M ?Medical Record Number: [id number] ?Patient Account Number: [id number] ?Date of Birth/Sex: ?Treating RN: ?December 02, 1965 (56 y.o. F) Deaton, Bobbi ?Primary Care Zekiah Caruth: Jenna Luo ?Other Clinician: ?Referring Thanos Cousineau: ?Treating Kerryn Tennant/Extender: Kalman Shan ?Jenna Luo ?Weeks in Treatment: 3 ?Active Problems ?Location of Pain Severity and Description of Pain ?Patient Has Paino Yes ?Site Locations ?Pain Location: ?Generalized Pain ?Rate the pain. ?Current Pain Level: 5 ?Worst Pain Level: 9 ?Least Pain Level: 0 ?Tolerable Pain Level: 8 ?Pain Management and Medication ?Current Pain Management: ?Medication: No ?Cold Application: No ?Rest: No ?Massage: No ?Activity: No ?T.E.N.S.: No ?Heat Application: No ?Leg drop or elevation: No ?Is the Current Pain Management Adequate:  Adequate ?How does your wound impact your activities of daily livingo ?Sleep: No ?Bathing: No ?Appetite: No ?Relationship With Others: No ?Bladder Continence: No ?Emotions: No ?Bowel Continence: No ?Work: No ?Toileting: No

## 2021-08-17 NOTE — Progress Notes (Signed)
[name] [name], [name] [name] ([id number]) ?Visit Report for 08/17/2021 ?Chief Complaint Document Details ?Patient Name: Date of Service: ?[name] [name], [name] D. 08/17/2021 11:00 A M ?Medical Record Number: [id number] ?Patient Account Number: [id number] ?Date of Birth/Sex: Treating RN: ?12/27/1965 (56 y.o. [name] [name] ?Primary Care Provider: Jenna Luo Other Clinician: ?Referring Provider: ?Treating Provider/Extender: Kalman Shan ?Jenna Luo ?Weeks in Treatment: 3 ?Information Obtained from: Patient ?Chief Complaint ?06/03/2019; patient is here for review of a wound on her plantar foot transmetatarsal amputation site at roughly the third met head ?07/23/2021; patient presents for right plantar foot wound ?Electronic Signature(s) ?Signed: 08/17/2021 11:48:17 AM By: Kalman Shan DO ?Entered By: Kalman Shan on 08/17/2021 11:37:39 ?-------------------------------------------------------------------------------- ?HPI Details ?Patient Name: Date of Service: ?VASILIA, DISE D. 08/17/2021 11:00 A M ?Medical Record Number: [id number] ?Patient Account Number: [id number] ?Date of Birth/Sex: Treating RN: ?April 24, 1965 (56 y.o. [name] [name] ?Primary Care Provider: Jenna Luo Other Clinician: ?Referring Provider: ?Treating Provider/Extender: Kalman Shan ?Jenna Luo ?Weeks in Treatment: 3 ?History of Present Illness ?HPI Description: ADMISSION ?06/02/2020 ?This is a 56 year old woman who has had problems with wounds on her predominantly plantar right foot for quite a period of time. 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 More recently she has had an area ?developed in the right mid foot at roughly the third metatarsal head. She says this started as a blister of November. Its not been closing. More recently she ?has been using Silvadene cream she has a cam boot to offload. I note she walks with a  cane ?The patient has had arterial studies done in 2019 at which time her ABI in the right was 1.28 with triphasic waveforms noncompressible on the left with triphasic ?waveform. Her ABI in our clinic today was 1.3 on the right. 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 ?She had a foot gastrocnemius recession sometime in the fall 2021 ?2/21; patient arrives today with a wound measuring slightly smaller 3 x 3 mm however there is circumferential area of erythema around this. She does not ?have any complaints ?2/28; plantar wound on the right first transmetatarsal amputation site. Small oval-shaped wound raised skin around the edges. We have been using Hydrofera ?Blue. She does not have arterial issues ?06/23/2020 upon evaluation today patient appears to be doing decently well. With regard to her wound. There is little bit of slough and some hyper granulation. I ?do not feel like the PolyMem did too well for her. In fact she may do better with some Hydrofera Blue. I know we just switch from that but I feel like that may ?be a better way to continue currently. ?3/15; this is a patient with a wound on the plantar right transmetatarsal amputation site probably the second metatarsal head not 1 July as stated previously we ?have been using polymen switch to [facility] last week. Wound looks about the same to me. We are going to start her on a total contact cast today we ?have the boot to fit ?3/18; first total contact cast change she tolerated this well ?3/31; she comes back in with out total contact cast on for 10 days. In spite of this the small wound over the third metatarsal head of her right transmetatarsal ?amputation site almost looks the same slightly hyper granulated. Circumference  is not well adhered. We have been using Hydrofera Blue ?4/7 she comes back in in her total contact cast. The wound  is smaller epithelializing and looks like it on its way to closing. We have been using Hydrofera Blue ?over the wound area ?She has a right transmetatarsal amputation. She tells [place] that she has been to [facility] in the past and they did a modified shoe for her however it ?did not keep this area epithelialized. She is going to bring that in next week for [place] to look at. It does not sound however that she was really offloading or ?padding this area at all ?4/14; total contact cast. I changed her to calcium alginate today. I think most of the small area is epithelialized although there is clearly not complete ?epithelialization and even the area that is epithelialized and the small wound is very tiny ?She shows me her shoe with the insert. It looks as though the insert probably was too large for her transmet it foot allowing it to move back and forward. I can ?see where the friction was in the blister she describes forming. I have asked her to take this back to [facility] to see if there is anything they can do ?4/21; patient presents for her 1 week follow-up. She has been treated with a total contact cast weekly. She has no complaints today. She denies any ?fever/chills. ?4/26; patient presents for 1 week follow-up in total contact cast change. Patient is having knee pain to her right knee due to chronic osteoarthritis. She is hoping ?to be able to wear her brace over the cast. ?5/10; the wound is closed over however still looks vulnerable. ?5/17; the wound remains closed surface looks better. She has a previous right transmet. She is going to convert this into a sneaker. She had a forefoot ?prosthesis for the foot but not sure that that are too much good she is supposed to have taken aback from modification however she can do that now. ?6/8; patient returns to our clinic for reopening of the plantar right foot transmetatarsal amputation site. She has slight warmth and erythema to the area.  She ?would like to try conservative wound care and not do the total contact cast that was she was doing before at this time ?10/01/2020 upon evaluation today patient appears to be doing okay in regard to her foot ulcer. Fortunately there does not appear to be any signs of active ?infection which is great news and overall very pleased with where things stand today. The wound does appear to be doing well and overall appearance wise is ?significantly improved compared to last week in my opinion based on my review of her pictures today. Overall the patient tells me that she is not having any ?significant pain which is great news as well. 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 This is a reoccurring wound for the patient. She has been following with Dr. Earleen Newport for this issue. She had a wound culture that grew Staph aureus sensitive to Bactrim and she is currently on this. She has been using Silvadene to the ?wound bed. She has a cam walker boot and an offloading scooter that she uses. She currently denies signs of infection. ?4/13; this is a  patient that is a type II diabetic complicated by peripheral neuropathy. She has had a previous transmetatarsal amputation of the right foot. She ?has a new wound on the plantar right foot between the first and second met heads probably close to the lateral part of the first met head. Generally small clean ?wound however from 7-9 o'clock there is a probing area close to bone. She finished a course of trimethoprim/sulfamethoxazole yesterday for methicillin- ?resistant Staph aureus in the wound. I believe this was ordered by podiatry she has been using a Dakin's wet-to-dry dressing. She uses a cam boot and a knee ?scooter. She says she is religious with  this ?4/20; patient presents for follow-up. She continues to use Dakin's wet-to-dry dressings without issues. She states she would like to try the total contact cast ?that she has had this in the past. She currently denies

## 2021-08-20 ENCOUNTER — Encounter (HOSPITAL_BASED_OUTPATIENT_CLINIC_OR_DEPARTMENT_OTHER): Payer: Medicare Other | Admitting: Internal Medicine

## 2021-08-21 ENCOUNTER — Other Ambulatory Visit: Payer: Self-pay | Admitting: Family Medicine

## 2021-08-21 ENCOUNTER — Other Ambulatory Visit: Payer: Self-pay

## 2021-08-21 NOTE — Telephone Encounter (Signed)
Requested medication (s) are due for refill today:   Yes ? ?Requested medication (s) are on the active medication list:   Yes ? ?Future visit scheduled:   No ? ? ?Last ordered: 07/27/2021 1 ml, 0 refills ? ?Returned because no protocol assigned to this medication  ? ?Requested Prescriptions  ?Pending Prescriptions Disp Refills  ? AIMOVIG 70 MG/ML SOAJ [Pharmacy Med Name: Aimovig Autoinjector 70 mg/mL subcutaneous auto-injector] 1 mL 0  ?  Sig: ADMINISTER 1 ML UNDER THE SKIN EVERY 30 DAYS  ?  ? Off-Protocol Failed - 08/21/2021 12:11 PM  ?  ?  Failed - Medication not assigned to a protocol, review manually.  ?  ?  Passed - Valid encounter within last 12 months  ?  Recent Outpatient Visits   ? ?      ? 1 month ago Infectious diarrhea  ? [facility], Cammie Mcgee, MD  ? 3 months ago Blood in stool, frank  ? [facility], Cammie Mcgee, MD  ? 9 months ago Ulcer of right foot with fat layer exposed ([place])  ? [facility], Cammie Mcgee, MD  ? 1 year ago Upper respiratory tract infection, unspecified type  ? [place] Eulogio Bear, NP  ? 1 year ago Preop exam for internal medicine  ? [facility], Cammie Mcgee, MD  ? ?  ?  ? ? ?  ?  ? Off-Protocol Failed - 08/21/2021 12:11 PM  ?  ?  Failed - Medication not assigned to a protocol, review manually.  ?  ?  Passed - Valid encounter within last 12 months  ?  Recent Outpatient Visits   ? ?      ? 1 month ago Infectious diarrhea  ? [facility], Cammie Mcgee, MD  ? 3 months ago Blood in stool, frank  ? [facility], Cammie Mcgee, MD  ? 9 months ago Ulcer of right foot with fat layer exposed ([place])  ? [facility], Cammie Mcgee, MD  ? 1 year ago Upper respiratory tract infection, unspecified type  ? [place] Eulogio Bear, NP  ? 1 year ago Preop exam for internal medicine  ? [facility], Cammie Mcgee, MD  ? ?  ?  ? ? ?  ?  ?  ? ?

## 2021-08-27 ENCOUNTER — Ambulatory Visit: Payer: 59

## 2021-09-01 ENCOUNTER — Encounter (HOSPITAL_BASED_OUTPATIENT_CLINIC_OR_DEPARTMENT_OTHER): Payer: Medicare Other | Admitting: Internal Medicine

## 2021-09-01 DIAGNOSIS — L97512 Non-pressure chronic ulcer of other part of right foot with fat layer exposed: Secondary | ICD-10-CM | POA: Diagnosis not present

## 2021-09-01 DIAGNOSIS — M1990 Unspecified osteoarthritis, unspecified site: Secondary | ICD-10-CM | POA: Diagnosis not present

## 2021-09-01 DIAGNOSIS — G9009 Other idiopathic peripheral autonomic neuropathy: Secondary | ICD-10-CM | POA: Diagnosis not present

## 2021-09-01 DIAGNOSIS — E11621 Type 2 diabetes mellitus with foot ulcer: Secondary | ICD-10-CM | POA: Diagnosis not present

## 2021-09-01 NOTE — Progress Notes (Signed)
BERYLE, BAGSBY ([id number]) ?Visit Report for 09/01/2021 ?Chief Complaint Document Details ?Patient Name: Date of Service: ?ALVIS, PULCINI D. 09/01/2021 2:45 PM ?Medical Record Number: [id number] ?Patient Account Number: [id number] ?Date of Birth/Sex: Treating RN: ?10-May-1965 (56 y.o. [name] ?Primary Care Provider: Jenna Luo Other Clinician: ?Referring Provider: ?Treating Provider/Extender: Kalman Shan ?Jenna Luo ?Weeks in Treatment: 5 ?Information Obtained from: Patient ?Chief Complaint ?06/03/2019; patient is here for review of a wound on her plantar foot transmetatarsal amputation site at roughly the third met head ?07/23/2021; patient presents for right plantar foot wound ?Electronic Signature(s) ?Signed: 09/01/2021 4:27:55 PM By: Kalman Shan DO ?Entered By: Kalman Shan on 09/01/2021 16:15:48 ?-------------------------------------------------------------------------------- ?HPI Details ?Patient Name: Date of Service: ?BREELYN, ICARD D. 09/01/2021 2:45 PM ?Medical Record Number: [id number] ?Patient Account Number: [id number] ?Date of Birth/Sex: Treating RN: ?[birth year] (56 y.o. [name] ?Primary Care Provider: Jenna Luo Other Clinician: ?Referring Provider: ?Treating Provider/Extender: Kalman Shan ?Jenna Luo ?Weeks in Treatment: 5 ?History of Present Illness ?HPI Description: ADMISSION ?06/02/2020 ?This is a 56 year old woman who has had problems with wounds on her predominantly plantar right foot for quite a period of time. 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 More recently she has had an area ?developed in the right mid foot at roughly the third metatarsal head. She says this started as a blister of November. Its not been closing. More recently she ?has been using Silvadene cream she has a cam boot to offload. I note she walks with a  cane ?The patient has had arterial studies done in 2019 at which time her ABI in the right was 1.28 with triphasic waveforms noncompressible on the left with triphasic ?waveform. Her ABI in our clinic today was 1.3 on the right. 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 ?She had a foot gastrocnemius recession sometime in the fall 2021 ?2/21; patient arrives today with a wound measuring slightly smaller 3 x 3 mm however there is circumferential area of erythema around this. She does not ?have any complaints ?2/28; plantar wound on the right first transmetatarsal amputation site. Small oval-shaped wound raised skin around the edges. We have been using Hydrofera ?Blue. She does not have arterial issues ?06/23/2020 upon evaluation today patient appears to be doing decently well. With regard to her wound. There is little bit of slough and some hyper granulation. I ?do not feel like the PolyMem did too well for her. In fact she may do better with some Hydrofera Blue. I know we just switch from that but I feel like that may ?be a better way to continue currently. ?3/15; this is a patient with a wound on the plantar right transmetatarsal amputation site probably the second metatarsal head not 1 July as stated previously we ?have been using polymen switch to [facility] last week. Wound looks about the same to me. We are going to start her on a total contact cast today we ?have the boot to fit ?3/18; first total contact cast change she tolerated this well ?3/31; she comes back in with out total contact cast on for 10 days. In spite of this the small wound over the third metatarsal head of her right transmetatarsal ?amputation site almost looks the same slightly hyper granulated. Circumference is not  well adhered. We have been using Hydrofera Blue ?4/7 she comes back in in her total contact cast. The wound  is smaller epithelializing and looks like it on its way to closing. We have been using Hydrofera Blue ?over the wound area ?She has a right transmetatarsal amputation. She tells [place] that she has been to [facility] in the past and they did a modified shoe for her however it ?did not keep this area epithelialized. She is going to bring that in next week for [place] to look at. It does not sound however that she was really offloading or ?padding this area at all ?4/14; total contact cast. I changed her to calcium alginate today. I think most of the small area is epithelialized although there is clearly not complete ?epithelialization and even the area that is epithelialized and the small wound is very tiny ?She shows me her shoe with the insert. It looks as though the insert probably was too large for her transmet it foot allowing it to move back and forward. I can ?see where the friction was in the blister she describes forming. I have asked her to take this back to [facility] to see if there is anything they can do ?4/21; patient presents for her 1 week follow-up. She has been treated with a total contact cast weekly. She has no complaints today. She denies any ?fever/chills. ?4/26; patient presents for 1 week follow-up in total contact cast change. Patient is having knee pain to her right knee due to chronic osteoarthritis. She is hoping ?to be able to wear her brace over the cast. ?5/10; the wound is closed over however still looks vulnerable. ?5/17; the wound remains closed surface looks better. She has a previous right transmet. She is going to convert this into a sneaker. She had a forefoot ?prosthesis for the foot but not sure that that are too much good she is supposed to have taken aback from modification however she can do that now. ?6/8; patient returns to our clinic for reopening of the plantar right foot transmetatarsal amputation site. She has slight warmth and erythema to the area.  She ?would like to try conservative wound care and not do the total contact cast that was she was doing before at this time ?10/01/2020 upon evaluation today patient appears to be doing okay in regard to her foot ulcer. Fortunately there does not appear to be any signs of active ?infection which is great news and overall very pleased with where things stand today. The wound does appear to be doing well and overall appearance wise is ?significantly improved compared to last week in my opinion based on my review of her pictures today. Overall the patient tells me that she is not having any ?significant pain which is great news as well. ?Readmission 07/23/2021 ?Ms. 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 This is a reoccurring wound for the patient. She has been following with Dr. Earleen Newport for this issue. She had a wound culture that grew Staph aureus sensitive to Bactrim and she is currently on this. She has been using Silvadene to the ?wound bed. She has a cam walker boot and an offloading scooter that she uses. She currently denies signs of infection. ?4/13; this is a patient that  is a type II diabetic complicated by peripheral neuropathy. She has had a previous transmetatarsal amputation of the right foot. She ?has a new wound on the plantar right foot between the first and second met heads probably close to the lateral part of the first met head. Generally small clean ?wound however from 7-9 o'clock there is a probing area close to bone. She finished a course of trimethoprim/sulfamethoxazole yesterday for methicillin- ?resistant Staph aureus in the wound. I believe this was ordered by podiatry she has been using a Dakin's wet-to-dry dressing. She uses a cam boot and a knee ?scooter. She says she is religious with  this ?4/20; patient presents for follow-up. She continues to use Dakin's wet-to-dry dressings without issues. She states she would like to try the total contact cast ?that she has had this in the past. She currently denies s

## 2021-09-01 NOTE — Progress Notes (Signed)
KAYLA, DESHAIES ([id number]) ?Visit Report for 09/01/2021 ?Arrival Information Details ?Patient Name: Date of Service: ?NADEEN, SHIPMAN D. 09/01/2021 2:45 PM ?Medical Record Number: [id number] ?Patient Account Number: [id number] ?Date of Birth/Sex: Treating RN: ?10/17/1965 (56 y.o. [name] ?Primary Care Shalah Estelle: Jenna Luo Other Clinician: ?Referring Safari Cinque: ?Treating Maxime Beckner/Extender: Kalman Shan ?Jenna Luo ?Weeks in Treatment: 5 ?Visit Information History Since Last Visit ?Added or deleted any medications: No ?Patient Arrived: Ambulatory ?Any new allergies or adverse reactions: No ?Arrival Time: 14:39 ?Had a fall or experienced change in No ?Transfer Assistance: None ?activities of daily living that may affect ?Patient Identification Verified: Yes ?risk of falls: ?Secondary Verification Process Completed: Yes ?Signs or symptoms of abuse/neglect since last visito No ?Patient Requires Transmission-Based Precautions: No ?Hospitalized since last visit: No ?Patient Has Alerts: No ?Implantable device outside of the clinic excluding No ?cellular tissue based products placed in the center ?since last visit: ?Has Dressing in Place as Prescribed: Yes ?Has Footwear/Offloading in Place as Prescribed: Yes ?Right: T Contact Cast ?otal ?Pain Present Now: No ?Electronic Signature(s) ?Signed: 09/01/2021 5:12:39 PM By: Lorrin Jackson ?Entered By: Lorrin Jackson on 09/01/2021 14:41:42 ?-------------------------------------------------------------------------------- ?Encounter Discharge Information Details ?Patient Name: Date of Service: ?RHILYNN, PREYER D. 09/01/2021 2:45 PM ?Medical Record Number: [id number] ?Patient Account Number: [id number] ?Date of Birth/Sex: Treating RN: ?1965-11-22 (56 y.o. [name] ?Primary Care Jacqulyn Barresi: Jenna Luo Other Clinician: ?Referring Jackelyn Illingworth: ?Treating Shadi Larner/Extender: Kalman Shan ?Jenna Luo ?Weeks in Treatment: 5 ?Encounter Discharge Information  Items ?Discharge Condition: Stable ?Ambulatory Status: Ambulatory ?Discharge Destination: Home ?Transportation: Private Auto ?Schedule Follow-up Appointment: Yes ?Clinical Summary of Care: Provided on 09/01/2021 ?Form Type Recipient ?Paper Patient Patient ?Electronic Signature(s) ?Signed: 09/01/2021 3:39:24 PM By: Lorrin Jackson ?Entered By: Lorrin Jackson on 09/01/2021 15:39:24 ?-------------------------------------------------------------------------------- ?Lower Extremity Assessment Details ?Patient Name: ?Date of Service: ?SHAMYIA, GRANDPRE D. 09/01/2021 2:45 PM ?Medical Record Number: [id number] ?Patient Account Number: [id number] ?Date of Birth/Sex: ?Treating RN: ?[birth year] (56 y.o. [name] ?Primary Care Reace Breshears: Jenna Luo ?Other Clinician: ?Referring Jone Panebianco: ?Treating Tanishka Drolet/Extender: Kalman Shan ?Jenna Luo ?Weeks in Treatment: 5 ?Edema Assessment ?Assessed: [Left: No] [Right: Yes] ?Edema: [Left: N] [Right: o] ?Calf ?Left: Right: ?Point of Measurement: 26 cm From Medial Instep 37.8 cm ?Ankle ?Left: Right: ?Point of Measurement: 10 cm From Medial Instep 22.5 cm ?Vascular Assessment ?Pulses: ?Dorsalis Pedis ?Palpable: [Right:Yes] ?Electronic Signature(s) ?Signed: 09/01/2021 5:12:39 PM By: Lorrin Jackson ?Entered By: Lorrin Jackson on 09/01/2021 14:58:23 ?-------------------------------------------------------------------------------- ?Multi Wound Chart Details ?Patient Name: ?Date of Service: ?MARIAH, GERSTENBERGER D. 09/01/2021 2:45 PM ?Medical Record Number: [id number] ?Patient Account Number: [id number] ?Date of Birth/Sex: ?Treating RN: ?31-Oct-1965 (56 y.o. [name] ?Primary Care Omnia Dollinger: Jenna Luo ?Other Clinician: ?Referring Bawi Lakins: ?Treating Lumen Brinlee/Extender: Kalman Shan ?Jenna Luo ?Weeks in Treatment: 5 ?Vital Signs ?Height(in): 64 ?Pulse(bpm): 74 ?Weight(lbs): 228 ?Blood Pressure(mmHg): 115/78 ?Body Mass Index(BMI): 39.1 ?Temperature(??F): 98.1 ?Respiratory  Rate(breaths/min): 18 ?Photos: [2:Right, Plantar Foot] [N/A:N/A N/A] ?Wound Location: [2:Gradually Appeared] [N/A:N/A] ?Wounding Event: [2:Neuropathic Ulcer-Non Diabetic] [N/A:N/A] ?Primary Etiology: [2:Asthma, Sleep Apnea, Hypertension, N/A] ?Comorbid History: [2:Osteoarthritis, Neuropathy, Seizure Disorder 07/12/2021] [N/A:N/A] ?Date Acquired: [2:5] [N/A:N/A] ?Weeks of Treatment: [2:Open] [N/A:N/A] ?Wound Status: [2:No] [N/A:N/A] ?Wound Recurrence: [2:0.7x0.8x0.1] [N/A:N/A] ?Measurements L x W x D (cm) [2:0.44] [N/A:N/A] ?A (cm?) : ?rea [2:0.044] [N/A:N/A] ?Volume (cm?) : [2:53.30%] [N/A:N/A] ?% Reduction in Area: [2:96.10%] [N/A:N/A] ?% Reduction in Volume: [2:Full Thickness Without Exposed] [N/A:N/A] ?Classification: [2:Support Structures Medium] [N/A:N/A] ?Exudate Amount: [2:Serosanguineous] [N/A:N/A] ?Exudate Type: [2:red, brown] [N/A:N/A] ?Exudate Color: [2:Well defined, not attached] [N/A:N/A] ?Wound Margin: [2:Large (67-100%)] [N/A:N/A] ?  Granulation Amount: [2:Red, Pink, Hyper-granulation] [N/A:N/A] ?Granulation Quality: [2:None Present (0%)] [N/A:N/A] ?Necrotic Amount: ?[2:Fat Layer (Subcutaneous Tissue): Yes N/A] ?Exposed Structures: ?[2:Fascia: No Tendon: No Muscle: No Joint: No Bone: No Small (1-33%)] [N/A:N/A] ?Epithelialization: [2:Chemical Cauterization] [N/A:N/A] ?Procedures Performed: [2:T Contact Cast otal] ?Treatment Notes ?Wound #2 (Foot) Wound Laterality: Plantar, Right ?Cleanser ?Soap and Water ?Discharge Instruction: May shower and wash wound with dial antibacterial soap and water prior to dressing change. ?Peri-Wound Care ?Topical ?Primary Dressing ?Hydrofera Blue Ready Foam, 2.5 x2.5 in ?Discharge Instruction: Apply to wound bed as instructed ?Secondary Dressing ?ABD Pad, 5x9 ?Discharge Instruction: Apply over primary dressing as directed. ?Woven Gauze Sponge, Non-Sterile 4x4 in ?Discharge Instruction: Apply over primary dressing as directed. ?Secured With ?Compression Wrap ?Compression  Stockings ?Add-Ons ?Electronic Signature(s) ?Signed: 09/01/2021 4:27:55 PM By: Kalman Shan DO ?Signed: 09/01/2021 5:12:39 PM By: Lorrin Jackson ?Entered By: Kalman Shan on 09/01/2021 16:15:40 ?-------------------------------------------------------------------------------- ?Pain Assessment Details ?Patient Name: ?Date of Service: ?KEILANI, TERRANCE D. 09/01/2021 2:45 PM ?Medical Record Number: [id number] ?Patient Account Number: [id number] ?Date of Birth/Sex: ?Treating RN: ?December 18, 1965 (56 y.o. [name] ?Primary Care Anyla Israelson: Jenna Luo ?Other Clinician: ?Referring Adea Geisel: ?Treating Geordan Xu/Extender: Kalman Shan ?Jenna Luo ?Weeks in Treatment: 5 ?Active Problems ?Location of Pain Severity and Description of Pain ?Patient Has Paino No ?Site Locations ?Pain Management and Medication ?Current Pain Management: ?Electronic Signature(s) ?Signed: 09/01/2021 5:12:39 PM By: Lorrin Jackson ?Entered By: Lorrin Jackson on 09/01/2021 14:43:28 ?-------------------------------------------------------------------------------- ?Patient/Caregiver Education Details ?Patient Name: ?Date of Service: ?LURIA, ROSARIO 5/16/2023andnbsp2:45 PM ?Medical Record Number: [id number] ?Patient Account Number: [id number] ?Date of Birth/Gender: ?Treating RN: ?March 20, 1966 (56 y.o. [name] ?Primary Care Physician: Jenna Luo ?Other Clinician: ?Referring Physician: ?Treating Physician/Extender: Kalman Shan ?Jenna Luo ?Weeks in Treatment: 5 ?Education Assessment ?Education Provided To: ?Patient ?Education Topics Provided ?Offloading: ?Methods: Explain/Verbal, Printed ?Responses: State content correctly ?Wound/Skin Impairment: ?Methods: Explain/Verbal, Printed ?Responses: State content correctly ?Electronic Signature(s) ?Signed: 09/01/2021 5:12:39 PM By: Lorrin Jackson ?Signed: 09/01/2021 5:12:39 PM By: Lorrin Jackson ?Entered By: Lorrin Jackson on 09/01/2021  14:39:10 ?-------------------------------------------------------------------------------- ?Wound Assessment Details ?Patient Name: ?Date of Service: ?GEORGANNE, SIPLE D. 09/01/2021 2:45 PM ?Medical Record Number: [id number] ?Patient Account Number: [id number]

## 2021-09-02 DIAGNOSIS — Z79891 Long term (current) use of opiate analgesic: Secondary | ICD-10-CM | POA: Diagnosis not present

## 2021-09-02 DIAGNOSIS — M25511 Pain in right shoulder: Secondary | ICD-10-CM | POA: Diagnosis not present

## 2021-09-02 DIAGNOSIS — G894 Chronic pain syndrome: Secondary | ICD-10-CM | POA: Diagnosis not present

## 2021-09-03 ENCOUNTER — Ambulatory Visit: Payer: Medicare Other

## 2021-09-07 ENCOUNTER — Encounter: Payer: Medicare Other | Admitting: Family Medicine

## 2021-09-08 ENCOUNTER — Encounter (HOSPITAL_BASED_OUTPATIENT_CLINIC_OR_DEPARTMENT_OTHER): Payer: Medicare Other | Admitting: Internal Medicine

## 2021-09-08 DIAGNOSIS — M1990 Unspecified osteoarthritis, unspecified site: Secondary | ICD-10-CM | POA: Diagnosis not present

## 2021-09-08 DIAGNOSIS — E11621 Type 2 diabetes mellitus with foot ulcer: Secondary | ICD-10-CM | POA: Diagnosis not present

## 2021-09-08 DIAGNOSIS — L97512 Non-pressure chronic ulcer of other part of right foot with fat layer exposed: Secondary | ICD-10-CM

## 2021-09-08 DIAGNOSIS — G9009 Other idiopathic peripheral autonomic neuropathy: Secondary | ICD-10-CM | POA: Diagnosis not present

## 2021-09-08 NOTE — Progress Notes (Signed)
[name] [name], [name] [name] ([id number]) Visit Report for 09/08/2021 Chief Complaint Document Details Patient Name: Date of Service: [name] [name] 09/08/2021 8:00 A M Medical Record Number: [id number] Patient Account Number: [id number] Date of Birth/Sex: Treating RN: [birth year] (56 y.o. [name] Primary Care Provider: Jenna Luo Other Clinician: Referring Provider: Treating Provider/Extender: Michel Bickers in Treatment: 6 Information Obtained from: Patient Chief Complaint 06/03/2019; patient is here for review of a wound on her plantar foot transmetatarsal amputation site at roughly the third met head 07/23/2021; patient presents for right plantar foot wound Electronic Signature(s) Signed: 09/08/2021 9:06:11 AM By: Kalman Shan DO Entered By: Kalman Shan on 09/08/2021 08:25:13 -------------------------------------------------------------------------------- HPI Details Patient Name: Date of Service: [name] Shutter D. 09/08/2021 8:00 A M Medical Record Number: [id number] Patient Account Number: [id number] Date of Birth/Sex: Treating RN: 12-09-1965 (56 y.o. [name] Primary Care Provider: Jenna Luo Other Clinician: Referring Provider: Treating Provider/Extender: Michel Bickers in Treatment: 6 History of Present Illness HPI Description: ADMISSION 06/02/2020 This is a 57 year old woman who has had problems with wounds on her predominantly plantar right foot for quite a period of time. 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 More recently she has had an area developed in the right mid foot at roughly the third metatarsal head. She says this started as a blister of November. Its not been closing. More recently she has been using Silvadene cream she has a cam boot to offload. I note she walks with a  cane The patient has had arterial studies done in 2019 at which time her ABI in the right was 1.28 with triphasic waveforms noncompressible on the left with triphasic waveform. Her ABI in our clinic today was 1.3 on the right. 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 She had a foot gastrocnemius recession sometime in the fall 2021 2/21; patient arrives today with a wound measuring slightly smaller 3 x 3 mm however there is circumferential area of erythema around this. She does not have any complaints 2/28; plantar wound on the right first transmetatarsal amputation site. Small oval-shaped wound raised skin around the edges. We have been using Hydrofera Blue. She does not have arterial issues 06/23/2020 upon evaluation today patient appears to be doing decently well. With regard to her wound. There is little bit of slough and some hyper granulation. I do not feel like the PolyMem did too well for her. In fact she may do better with some Hydrofera Blue. I know we just switch from that but I feel like that may be a better way to continue currently. 3/15; this is a patient with a wound on the plantar right transmetatarsal amputation site probably the second metatarsal head not 1 July as stated previously we have been using polymen switch to [facility] last week. Wound looks about the same to me. We are going to start her on a total contact cast today we have the boot to fit 3/18; first total contact cast change she tolerated this well 3/31; she comes back in with out total contact cast on for 10 days. In spite of this the small wound over the third metatarsal head of her right transmetatarsal amputation site almost looks the same slightly hyper granulated. Circumference  is not well adhered. We have been using Hydrofera Blue 4/7 she comes back in in her total contact cast. The wound  is smaller epithelializing and looks like it on its way to closing. We have been using Hydrofera Blue over the wound area She has a right transmetatarsal amputation. She tells [place] that she has been to [facility] in the past and they did a modified shoe for her however it did not keep this area epithelialized. She is going to bring that in next week for [place] to look at. It does not sound however that she was really offloading or padding this area at all 4/14; total contact cast. I changed her to calcium alginate today. I think most of the small area is epithelialized although there is clearly not complete epithelialization and even the area that is epithelialized and the small wound is very tiny She shows me her shoe with the insert. It looks as though the insert probably was too large for her transmet it foot allowing it to move back and forward. I can see where the friction was in the blister she describes forming. I have asked her to take this back to [facility] to see if there is anything they can do 4/21; patient presents for her 1 week follow-up. She has been treated with a total contact cast weekly. She has no complaints today. She denies any fever/chills. 4/26; patient presents for 1 week follow-up in total contact cast change. Patient is having knee pain to her right knee due to chronic osteoarthritis. She is hoping to be able to wear her brace over the cast. 5/10; the wound is closed over however still looks vulnerable. 5/17; the wound remains closed surface looks better. She has a previous right transmet. She is going to convert this into a sneaker. She had a forefoot prosthesis for the foot but not sure that that are too much good she is supposed to have taken aback from modification however she can do that now. 6/8; patient returns to our clinic for reopening of the plantar right foot transmetatarsal amputation site. She has slight warmth and erythema to the area.  She would like to try conservative wound care and not do the total contact cast that was she was doing before at this time 10/01/2020 upon evaluation today patient appears to be doing okay in regard to her foot ulcer. Fortunately there does not appear to be any signs of active infection which is great news and overall very pleased with where things stand today. The wound does appear to be doing well and overall appearance wise is significantly improved compared to last week in my opinion based on my review of her pictures today. Overall the patient tells me that she is not having any significant pain which is great news as well. Readmission 07/23/2021 Ms. 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 This is a reoccurring wound for the patient. She has been following with Dr. Earleen Newport for this issue. She had a wound culture that grew Staph aureus sensitive to Bactrim and she is currently on this. She has been using Silvadene to the wound bed. She has a cam walker boot and an offloading scooter that she uses. She currently denies signs of infection. 4/13; this is a  patient that is a type II diabetic complicated by peripheral neuropathy. She has had a previous transmetatarsal amputation of the right foot. She has a new wound on the plantar right foot between the first and second met heads probably close to the lateral part of the first met head. Generally small clean wound however from 7-9 o'clock there is a probing area close to bone. She finished a course of trimethoprim/sulfamethoxazole yesterday for methicillin- resistant Staph aureus in the wound. I believe this was ordered by podiatry she has been using a Dakin's wet-to-dry dressing. She uses a cam boot and a knee scooter. She says she is religious with  this 4/20; patient presents for follow-up. She continues to use Dakin's wet-to-dry dressings without issues. She states she would like to try the total contact cast that she has had this in the past. She currently denies signs of infection. 4/24; patient presents for follow-up. She would like the total contact cast placed today. She has been using Dakin's wet-to-dry dressings without issues. She denies signs of infection. 08-12-2021 patient presents today for obligatory first cast change. This was placed on Monday today is Wednesday. She seems to be doing quite well with using Hydrofera Blue and the overall appearance of the wound bed is excellent. We do need to replace the cast the good news is there is nothing that seems to be rubbing at this point which is awesome. 5/1; patient presents for follow-up. She has tolerated the total contact cast well. She has no issues or complaints today. 5/16; patient presents for follow-up. She states she missed her last week appointment due to having 2 appointments at the same time. She admits to knowing she is not supposed to have the contact cast for over 7 days. She currently denies signs of infection. She has no issues or complaints today. 5/23; patient presents for follow-up. She has no issues or complaints pertaining to the total contact cast. We have been using Hydrofera Blue. She denies signs of infection. Electronic Signature(s) Signed: 09/08/2021 9:06:11 AM By: Kalman Shan DO Entered By: Kalman Shan on 09/08/2021 08:25:44 -------------------------------------------------------------------------------- Chemical Cauterization Details Patient Name: Date of Service: [name] Shutter D. 09/08/2021 8:00 A M Medical Record Number: [id number] Patient Account Number: [id number] Date of Birth/Sex: Treating RN: Jun 17, 1965 (56 y.o. [name] Primary Care Provider: Jenna Luo Other Clinician: Referring Provider: Treating Provider/Extender:  Michel Bickers in Treatment: 6 Procedure Performed for: Wound #2 Right,Plantar Foot Performed By: Physician Kalman Shan, DO Post Procedure Diagnosis Same as Pre-procedure Electronic Signature(s) Signed: 09/08/2021 9:06:11 AM By: Kalman Shan DO Signed: 09/08/2021 4:09:03 PM By: Lorrin Jackson Entered By: Lorrin Jackson on 09/08/2021 08:18:04 -------------------------------------------------------------------------------- Physical Exam Details Patient Name: Date of Service: [name] Shutter D. 09/08/2021 8:00 A M Medical Record Number: [id number] Patient Account Number: [id number] Date of Birth/Sex: Treating RN: Feb 05, 1966 (56 y.o. [name] Primary Care Provider: Jenna Luo Other Clinician: Referring Provider: Treating Provider/Extender: Michel Bickers in Treatment: 6 Constitutional respirations regular, non-labored and within target range for patient.. Cardiovascular 2+ dorsalis pedis/posterior tibialis pulses. Psychiatric pleasant and cooperative. Notes Right foot: T the plantar aspect there is an open wound with hyper granulated tissue. No tunneling or undermining noted. No signs of surrounding infection. o Electronic Signature(s) Signed: 09/08/2021 9:06:11 AM By: Kalman Shan DO Entered By: Kalman Shan on 09/08/2021 08:29:35 -------------------------------------------------------------------------------- Physician Orders Details Patient Name: Date of Service: [name] Shutter D. 09/08/2021 8:00 A M Medical Record Number: [id number] Patient Account Number: [id number]  Date of Birth/Sex: Treating RN: 06-08-1965 (56 y.o. [name] Primary Care Provider: Jenna Luo Other Clinician: Referring Provider: Treating Provider/Extender: Michel Bickers in Treatment: 6 Verbal / Phone Orders: No Diagnosis Coding ICD-10 Coding Code Description [phone] Non-pressure chronic  ulcer of other part of right foot with fat layer exposed E11.621 Type 2 diabetes mellitus with foot ulcer G90.09 Other idiopathic peripheral autonomic neuropathy Follow-up Appointments ppointment in 1 week. - on Tuesday 09/15/21 @ 11:00 am w/ Dr. Heber Montebello and Leveda Anna Room # 7 Return A *****EXTRA TIME TCC*** Bathing/ Shower/ Hygiene May shower and wash wound with soap and water. - when changing dressing Edema Control - Lymphedema / SCD / Other Avoid standing for long periods of time. Off-Loading Total Contact Cast to Right Lower Extremity Other: - Continue to use knee scooter Wound Treatment Wound #2 - Foot Wound Laterality: Plantar, Right Cleanser: Soap and Water 1 x Per Week/30 Days Discharge Instructions: May shower and wash wound with dial antibacterial soap and water prior to dressing change. Prim Dressing: Hydrofera Blue Ready Foam, 2.5 x2.5 in 1 x Per Week/30 Days ary Discharge Instructions: Apply to wound bed as instructed Secondary Dressing: ABD Pad, 5x9 1 x Per Week/30 Days Discharge Instructions: Apply over primary dressing as directed. Secondary Dressing: Optifoam Non-Adhesive Dressing, 4x4 in 1 x Per Week/30 Days Discharge Instructions: Apply as donut Secondary Dressing: Woven Gauze Sponge, Non-Sterile 4x4 in 1 x Per Week/30 Days Discharge Instructions: Apply over primary dressing as directed. Electronic Signature(s) Signed: 09/08/2021 9:06:11 AM By: Kalman Shan DO Signed: 09/08/2021 4:09:03 PM By: Lorrin Jackson Entered By: Lorrin Jackson on 09/08/2021 08:41:48 -------------------------------------------------------------------------------- Problem List Details Patient Name: Date of Service: [name] Shutter D. 09/08/2021 8:00 A M Medical Record Number: [id number] Patient Account Number: [id number] Date of Birth/Sex: Treating RN: May 21, 1965 (56 y.o. [name] Primary Care Provider: Jenna Luo Other Clinician: Referring Provider: Treating  Provider/Extender: Michel Bickers in Treatment: 6 Active Problems ICD-10 Encounter Code Description Active Date MDM Diagnosis L97.512 Non-pressure chronic ulcer of other part of right foot with fat layer exposed 07/23/2021 No Yes E11.621 Type 2 diabetes mellitus with foot ulcer 07/23/2021 No Yes G90.09 Other idiopathic peripheral autonomic neuropathy 07/23/2021 No Yes Inactive Problems Resolved Problems Electronic Signature(s) Signed: 09/08/2021 9:06:11 AM By: Kalman Shan DO Entered By: Kalman Shan on 09/08/2021 08:24:44 -------------------------------------------------------------------------------- Progress Note Details Patient Name: Date of Service: [name] Shutter D. 09/08/2021 8:00 A M Medical Record Number: [id number] Patient Account Number: [id number] Date of Birth/Sex: Treating RN: 1966-01-29 (56 y.o. [name] Primary Care Provider: Jenna Luo Other Clinician: Referring Provider: Treating Provider/Extender: Michel Bickers in Treatment: 6 Subjective Chief Complaint Information obtained from Patient 06/03/2019; patient is here for review of a wound on her plantar foot transmetatarsal amputation site at roughly the third met head 07/23/2021; patient presents for right plantar foot wound History of Present Illness (HPI) ADMISSION 06/02/2020 This is a 56 year old woman who has had problems with wounds on her predominantly plantar right foot for quite a period of time. 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 More recently she has had an area developed in the right mid foot at roughly the third metatarsal head. She says this started as a blister of November. Its not been closing. More recently she has been using Silvadene cream she has a  cam boot to offload. I note she walks with a cane The patient has had  arterial studies done in 2019 at which time her ABI in the right was 1.28 with triphasic waveforms noncompressible on the left with triphasic waveform. Her ABI in our clinic today was 1.3 on the right. 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 She had a foot gastrocnemius recession sometime in the fall 2021 2/21; patient arrives today with a wound measuring slightly smaller 3 x 3 mm however there is circumferential area of erythema around this. She does not have any complaints 2/28; plantar wound on the right first transmetatarsal amputation site. Small oval-shaped wound raised skin around the edges. We have been using Hydrofera Blue. She does not have arterial issues 06/23/2020 upon evaluation today patient appears to be doing decently well. With regard to her wound. There is little bit of slough and some hyper granulation. I do not feel like the PolyMem did too well for her. In fact she may do better with some Hydrofera Blue. I know we just switch from that but I feel like that may be a better way to continue currently. 3/15; this is a patient with a wound on the plantar right transmetatarsal amputation site probably the second metatarsal head not 1 July as stated previously we have been using polymen switch to [facility] last week. Wound looks about the same to me. We are going to start her on a total contact cast today we have the boot to fit 3/18; first total contact cast change she tolerated this well 3/31; she comes back in with out total contact cast on for 10 days. In spite of this the small wound over the third metatarsal head of her right transmetatarsal amputation site almost looks the same slightly hyper granulated. Circumference is not well adhered. We have been using Hydrofera Blue 4/7 she comes back in in her total contact cast. The wound is smaller epithelializing  and looks like it on its way to closing. We have been using Hydrofera Blue over the wound area She has a right transmetatarsal amputation. She tells [place] that she has been to [facility] in the past and they did a modified shoe for her however it did not keep this area epithelialized. She is going to bring that in next week for [place] to look at. It does not sound however that she was really offloading or padding this area at all 4/14; total contact cast. I changed her to calcium alginate today. I think most of the small area is epithelialized although there is clearly not complete epithelialization and even the area that is epithelialized and the small wound is very tiny She shows me her shoe with the insert. It looks as though the insert probably was too large for her transmet it foot allowing it to move back and forward. I can see where the friction was in the blister she describes forming. I have asked her to take this back to [facility] to see if there is anything they can do 4/21; patient presents for her 1 week follow-up. She has been treated with a total contact cast weekly. She has no complaints today. She denies any fever/chills. 4/26; patient presents for 1 week follow-up in total contact cast change. Patient is having knee pain to her right knee due to chronic osteoarthritis. She is hoping to  be able to wear her brace over the cast. 5/10; the wound is closed over however still looks vulnerable. 5/17; the wound remains closed surface looks better. She has a previous right transmet. She is going to convert this into a sneaker. She had a forefoot prosthesis for the foot but not sure that that are too much good she is supposed to have taken aback from modification however she can do that now. 6/8; patient returns to our clinic for reopening of the plantar right foot transmetatarsal amputation site. She has slight warmth and erythema to the area. She would like to try  conservative wound care and not do the total contact cast that was she was doing before at this time 10/01/2020 upon evaluation today patient appears to be doing okay in regard to her foot ulcer. Fortunately there does not appear to be any signs of active infection which is great news and overall very pleased with where things stand today. The wound does appear to be doing well and overall appearance wise is significantly improved compared to last week in my opinion based on my review of her pictures today. Overall the patient tells me that she is not having any significant pain which is great news as well. Readmission 07/23/2021 Ms. 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 This is a reoccurring wound for the patient. She has been following with Dr. Earleen Newport for this issue. She had a wound culture that grew Staph aureus sensitive to Bactrim and she is currently on this. She has been using Silvadene to the wound bed. She has a cam walker boot and an offloading scooter that she uses. She currently denies signs of infection. 4/13; this is a patient that is a type II diabetic complicated by peripheral neuropathy. She has had a previous transmetatarsal amputation of the right foot. She has a new wound on the plantar right foot between the first and second met heads probably close to the lateral part of the first met head. Generally small clean wound however from 7-9 o'clock there is a probing area close to bone. She finished a course of trimethoprim/sulfamethoxazole yesterday for methicillin- resistant Staph aureus in the wound. I believe this was ordered by podiatry she has been using a Dakin's wet-to-dry dressing. She uses a cam boot and a knee scooter. She says she is religious with this 4/20; patient presents  for follow-up. She continues to use Dakin's wet-to-dry dressings without issues. She states she would like to try the total contact cast that she has had this in the past. She currently denies signs of infection. 4/24; patient presents for follow-up. She would like the total contact cast placed today. She has been using Dakin's wet-to-dry dressings without issues. She denies signs of infection. 08-12-2021 patient presents today for obligatory first cast change. This was placed on Monday today is Wednesday. She seems to be doing quite well with using Hydrofera Blue and the overall appearance of the wound bed is excellent. We do need to replace the cast the good news is there is nothing that seems to be rubbing at this point which is awesome. 5/1; patient presents for follow-up. She has tolerated the total contact cast well. She has no issues or complaints today. 5/16; patient presents for follow-up. She states she missed her last  week appointment due to having 2 appointments at the same time. She admits to knowing she is not supposed to have the contact cast for over 7 days. She currently denies signs of infection. She has no issues or complaints today. 5/23; patient presents for follow-up. She has no issues or complaints pertaining to the total contact cast. We have been using Hydrofera Blue. She denies signs of infection. Patient History Information obtained from Patient. Family History Cancer - Paternal Grandparents, Diabetes - Mother,Maternal Grandparents, Heart Disease - Mother,Paternal Grandparents,Maternal Grandparents,Father,Siblings, Hypertension - Mother,Father, Lung Disease - Mother, No family history of Hereditary Spherocytosis, Kidney Disease, Seizures, Stroke, Thyroid Problems, Tuberculosis. Social History Never smoker, Marital Status - Widowed, Alcohol Use - Never, Drug Use - Prior History, Caffeine Use - Daily - soda's. Medical History Eyes Denies history of Cataracts, Glaucoma,  Optic Neuritis Ear/Nose/Mouth/Throat Denies history of Chronic sinus problems/congestion, Middle ear problems Hematologic/Lymphatic Denies history of Anemia, Hemophilia, Human Immunodeficiency Virus, Lymphedema, Sickle Cell Disease Respiratory Patient has history of Asthma, Sleep Apnea Denies history of Aspiration, Chronic Obstructive Pulmonary Disease (COPD), Pneumothorax, Tuberculosis Cardiovascular Patient has history of Hypertension Denies history of Angina, Arrhythmia, Congestive Heart Failure, Coronary Artery Disease, Deep Vein Thrombosis, Hypotension, Myocardial Infarction, Peripheral Arterial Disease, Peripheral Venous Disease, Phlebitis, Vasculitis Gastrointestinal Denies history of Cirrhosis , Colitis, Crohnoos, Hepatitis A, Hepatitis B, Hepatitis C Endocrine Denies history of Type I Diabetes, Type II Diabetes Genitourinary Denies history of End Stage Renal Disease Immunological Denies history of Lupus Erythematosus, Raynaudoos, Scleroderma Integumentary (Skin) Denies history of History of Burn Musculoskeletal Patient has history of Osteoarthritis Denies history of Gout, Rheumatoid Arthritis, Osteomyelitis Neurologic Patient has history of Neuropathy, 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 - left 4th toe amp. - neck surgery. - spine surgery. - abdom. hysterectomy. - appendectomy. - lap. knee surgery. Medical A Surgical History Notes nd Constitutional Symptoms (General Health) torn right shoulder rotator cuff. Gastrointestinal Hx of GERD Psychiatric hx of bipolar 1 disorder, anxiety, depression. Objective Constitutional respirations regular, non-labored and within target range for patient.. Vitals Time Taken:  7:59 AM, Height: 64 in, Weight: 228 lbs, BMI: 39.1, Temperature: 97.9 F, Pulse: 78 bpm, Respiratory Rate: 18 breaths/min, Blood Pressure: 124/82 mmHg. Cardiovascular 2+ dorsalis pedis/posterior tibialis pulses. Psychiatric pleasant and cooperative. General Notes: Right foot: T the plantar aspect there is an open wound with hyper granulated tissue. No tunneling or undermining noted. No signs of o surrounding infection. Integumentary (Hair, Skin) Wound #2 status is Open. Original cause of wound was Gradually Appeared. The date acquired was: 07/12/2021. The wound has been in treatment 6 weeks. The wound is located on the [place]. The wound measures 0.6cm length x 0.5cm width x 0.1cm depth; 0.236cm^2 area and 0.024cm^3 volume. There is Fat Layer (Subcutaneous Tissue) exposed. There is no tunneling or undermining noted. There is a medium amount of serosanguineous drainage noted. The wound margin is well defined and not attached to the wound base. There is large (67-100%) red, pink, hyper - granulation within the wound bed. There is no necrotic tissue within the wound bed. Assessment Active Problems ICD-10 Non-pressure chronic ulcer of other part of right foot with fat layer exposed Type 2 diabetes mellitus with foot ulcer Other idiopathic peripheral autonomic neuropathy Patient's wound has shown improvement in size since last clinic visit. It is still hyper granulated  and I used silver nitrate to this. She has tolerated the total contact cast well. We reapplied this today and continued with Hydrofera Blue. Follow-up in 1 week. Procedures Wound #2 Pre-procedure diagnosis of Wound #2 is a Neuropathic Ulcer-Non Diabetic located on the [place] . There was a T Programmer, multimedia Procedure by Cephas Darby, Hamp Moreland, DO. Post procedure Diagnosis Wound #2: Same as Pre-Procedure Pre-procedure diagnosis of Wound #2 is a Neuropathic Ulcer-Non Diabetic located on the [place] .  An Chemical Cauterization procedure was performed by Kalman Shan, DO. Post procedure Diagnosis Wound #2: Same as Pre-Procedure Plan Follow-up Appointments: Return Appointment in 1 week. - on Tuesday 09/15/21 @ 11:00 am w/ Dr. Heber Butler and Leveda Anna Room # 7 *****EXTRA TIME TCC*** Bathing/ Shower/ Hygiene: May shower and wash wound with soap and water. - when changing dressing Edema Control - Lymphedema / SCD / Other: Avoid standing for long periods of time. Off-Loading: T Contact Cast to Right Lower Extremity otal Other: - Continue to use knee scooter WOUND #2: - Foot Wound Laterality: Plantar, Right Cleanser: Soap and Water 1 x Per Week/30 Days Discharge Instructions: May shower and wash wound with dial antibacterial soap and water prior to dressing change. Prim Dressing: Hydrofera Blue Ready Foam, 2.5 x2.5 in 1 x Per Week/30 Days ary Discharge Instructions: Apply to wound bed as instructed Secondary Dressing: ABD Pad, 5x9 1 x Per Week/30 Days Discharge Instructions: Apply over primary dressing as directed. Secondary Dressing: Woven Gauze Sponge, Non-Sterile 4x4 in 1 x Per Week/30 Days Discharge Instructions: Apply over primary dressing as directed. 1. Silver nitrate 2. T contact cast otal 3. Hydrofera Blue 4. Follow-up in 1 week Electronic Signature(s) Signed: 09/08/2021 9:06:11 AM By: Kalman Shan DO Entered By: Kalman Shan on 09/08/2021 08:30:43 -------------------------------------------------------------------------------- HxROS Details Patient Name: Date of Service: [name] Shutter D. 09/08/2021 8:00 A M Medical Record Number: [id number] Patient Account Number: [id number] Date of Birth/Sex: Treating RN: October 22, 1965 (56 y.o. [name] Primary Care Provider: Jenna Luo Other Clinician: Referring Provider: Treating Provider/Extender: Michel Bickers in Treatment: 6 Information Obtained From Patient Constitutional Symptoms  (General Health) Medical History: Past Medical History Notes: torn right shoulder rotator cuff. Eyes Medical History: Negative for: Cataracts; Glaucoma; Optic Neuritis Ear/Nose/Mouth/Throat Medical History: Negative for: Chronic sinus problems/congestion; Middle ear problems Hematologic/Lymphatic Medical History: Negative for: Anemia; Hemophilia; Human Immunodeficiency Virus; Lymphedema; Sickle Cell Disease Respiratory Medical History: Positive for: Asthma; Sleep Apnea Negative for: Aspiration; Chronic Obstructive Pulmonary Disease (COPD); Pneumothorax; Tuberculosis Cardiovascular Medical History: Positive for: Hypertension Negative for: Angina; Arrhythmia; Congestive Heart Failure; Coronary Artery Disease; Deep Vein Thrombosis; Hypotension; Myocardial Infarction; Peripheral Arterial Disease; Peripheral Venous Disease; Phlebitis; Vasculitis Gastrointestinal Medical History: Negative for: Cirrhosis ; Colitis; Crohns; Hepatitis A; Hepatitis B; Hepatitis C Past Medical History Notes: Hx of GERD Endocrine Medical History: Negative for: Type I Diabetes; Type II Diabetes Genitourinary Medical History: Negative for: End Stage Renal Disease Immunological Medical History: Negative for: Lupus Erythematosus; Raynauds; Scleroderma Integumentary (Skin) Medical History: Negative for: History of Burn Musculoskeletal Medical History: Positive for: Osteoarthritis Negative for: Gout; Rheumatoid Arthritis; Osteomyelitis Neurologic Medical History: Positive for: Neuropathy; Seizure Disorder - hx of seizures r/t tramadol Negative for: Dementia; Quadriplegia; Paraplegia Oncologic Medical History: Negative for: Received Chemotherapy; Received Radiation Psychiatric Medical History: Negative for: Anorexia/bulimia; Confinement Anxiety Past Medical History Notes: hx of bipolar 1 disorder, anxiety, depression. Immunizations Pneumococcal Vaccine: Received Pneumococcal Vaccination:  Yes Received Pneumococcal Vaccination On or After 60th Birthday: No Immunization Notes: pt. doesn't remember  last tetanus shot Implantable Devices None Hospitalization / Surgery History Type of Hospitalization/Surgery trasmet amp right foot left 4th toe amp neck surgery spine surgery abdom. hysterectomy appendectomy lap. knee surgery Family and Social History Cancer: Yes - Paternal Grandparents; Diabetes: Yes - Mother,Maternal Grandparents; Heart Disease: Yes - Mother,Paternal Grandparents,Maternal Grandparents,Father,Siblings; Hereditary Spherocytosis: No; Hypertension: Yes - Mother,Father; Kidney Disease: No; Lung Disease: Yes - Mother; Seizures: No; Stroke: No; Thyroid Problems: No; Tuberculosis: No; Never smoker; Marital Status - Widowed; Alcohol Use: Never; Drug Use: Prior History; Caffeine Use: Daily - soda's; Financial Concerns: No; Food, Clothing or Shelter Needs: No; Support System Lacking: No; Transportation Concerns: No Electronic Signature(s) Signed: 09/08/2021 9:06:11 AM By: Kalman Shan DO Signed: 09/08/2021 4:09:03 PM By: Lorrin Jackson Entered By: Kalman Shan on 09/08/2021 08:27:39 -------------------------------------------------------------------------------- Total Contact Cast Details Patient Name: Date of Service: [name] Shutter D. 09/08/2021 8:00 A M Medical Record Number: [id number] Patient Account Number: [id number] Date of Birth/Sex: Treating RN: February 28, 1966 (56 y.o. [name] Primary Care Provider: Jenna Luo Other Clinician: Referring Provider: Treating Provider/Extender: Michel Bickers in Treatment: 6 T Contact Cast Applied for Wound Assessment: otal Wound #2 Right,Plantar Foot Performed By: Physician Kalman Shan, DO Post Procedure Diagnosis Same as Pre-procedure Electronic Signature(s) Signed: 09/08/2021 9:06:11 AM By: Kalman Shan DO Signed: 09/08/2021 4:09:03 PM By: Lorrin Jackson Entered By:  Lorrin Jackson on 09/08/2021 08:18:14 -------------------------------------------------------------------------------- [place] Details Patient Name: Date of Service: [name] [name] 09/08/2021 Medical Record Number: [id number] Patient Account Number: [id number] Date of Birth/Sex: Treating RN: 1966-01-08 (56 y.o. [name] Primary Care Provider: Jenna Luo Other Clinician: Referring Provider: Treating Provider/Extender: Michel Bickers in Treatment: 6 Diagnosis Coding ICD-10 Codes Code Description [phone] Non-pressure chronic ulcer of other part of right foot with fat layer exposed E11.621 Type 2 diabetes mellitus with foot ulcer G90.09 Other idiopathic peripheral autonomic neuropathy Facility Procedures CPT4 Code: [id number] Description: 25956 - CHEM CAUT GRANULATION TISS ICD-10 Diagnosis Description L97.512 Non-pressure chronic ulcer of other part of right foot with fat layer exposed Modifier: Quantity: 1 CPT4 Code: [id number] Description: 29518 - APPLY TOTAL CONTACT LEG CAST ICD-10 Diagnosis Description L97.512 Non-pressure chronic ulcer of other part of right foot with fat layer exposed Modifier: Quantity: 1 Physician Procedures : CPT4 Code Description Modifier [id number] 30160 - WC PHYS CHEM CAUT GRAN TISSUE ICD-10 Diagnosis Description L97.512 Non-pressure chronic ulcer of other part of right foot with fat layer exposed Quantity: 1 : [id number] 57322 - WC PHYS APPLY TOTAL CONTACT CAST ICD-10 Diagnosis Description L97.512 Non-pressure chronic ulcer of other part of right foot with fat layer exposed Quantity: 1 Electronic Signature(s) Signed: 09/08/2021 9:06:11 AM By: Kalman Shan DO Entered By: Kalman Shan on 09/08/2021 08:30:55

## 2021-09-08 NOTE — Progress Notes (Signed)
[name] [name] ([id number]) Visit Report for 09/08/2021 Arrival Information Details Patient Name: Date of Service: [name] [name] 09/08/2021 8:00 A M Medical Record Number: [id number] Patient Account Number: [id number] Date of Birth/Sex: Treating RN: 1965/06/13 (56 y.o. [name] Primary Care Bernyce Brimley: Jenna Luo Other Clinician: Referring Verdun Rackley: Treating Damel Querry/Extender: Michel Bickers in Treatment: 6 Visit Information History Since Last Visit Added or deleted any medications: No Patient Arrived: [name] [name] Any new allergies or adverse reactions: No Arrival Time: 07:57 Had a fall or experienced change in No Transfer Assistance: None activities of daily living that may affect Patient Identification Verified: Yes risk of falls: Secondary Verification Process Completed: Yes Signs or symptoms of abuse/neglect since last visito No Patient Requires Transmission-Based Precautions: No Hospitalized since last visit: No Patient Has Alerts: No Implantable device outside of the clinic excluding No cellular tissue based products placed in the center since last visit: Has Dressing in Place as Prescribed: Yes Has Footwear/Offloading in Place as Prescribed: Yes Right: T Contact Cast otal Pain Present Now: No Electronic Signature(s) Signed: 09/08/2021 4:09:03 PM By: Lorrin Jackson Entered By: Lorrin Jackson on 09/08/2021 07:59:22 -------------------------------------------------------------------------------- Encounter Discharge Information Details Patient Name: Date of Service: [name] [name] D. 09/08/2021 8:00 A M Medical Record Number: [id number] Patient Account Number: [id number] Date of Birth/Sex: Treating RN: January 20, 1966 (56 y.o. [name] Primary Care Sherre Wooton: Jenna Luo Other Clinician: Referring Lakeria Starkman: Treating Kourtnie Sachs/Extender: Michel Bickers in Treatment: 6 Encounter Discharge Information  Items Discharge Condition: Stable Ambulatory Status: [place] Discharge Destination: Home Transportation: Private Auto Schedule Follow-up Appointment: Yes Clinical Summary of Care: Provided on 09/08/2021 Form Type Recipient Paper Patient Patient Electronic Signature(s) Signed: 09/08/2021 8:42:42 AM By: Lorrin Jackson Entered By: Lorrin Jackson on 09/08/2021 08:42:42 -------------------------------------------------------------------------------- Lower Extremity Assessment Details Patient Name: Date of Service: [name] [name] D. 09/08/2021 8:00 A M Medical Record Number: [id number] Patient Account Number: [id number] Date of Birth/Sex: Treating RN: 1965-06-10 (56 y.o. [name] Primary Care Latham Kinzler: Jenna Luo Other Clinician: Referring Hayven Croy: Treating Sherrina Zaugg/Extender: Michel Bickers in Treatment: 6 Edema Assessment Assessed: [Left: No] [Right: Yes] Edema: [Left: N] [Right: o] Calf Left: Right: Point of Measurement: 26 cm From Medial Instep 37.5 cm Ankle Left: Right: Point of Measurement: 10 cm From Medial Instep 22.5 cm Vascular Assessment Pulses: Dorsalis Pedis Palpable: [Right:Yes] Electronic Signature(s) Signed: 09/08/2021 4:09:03 PM By: Lorrin Jackson Entered By: Lorrin Jackson on 09/08/2021 08:12:40 -------------------------------------------------------------------------------- Multi Wound Chart Details Patient Name: Date of Service: [name] [name] D. 09/08/2021 8:00 A M Medical Record Number: [id number] Patient Account Number: [id number] Date of Birth/Sex: Treating RN: 06-08-1965 (56 y.o. [name] Primary Care Shanetra Blumenstock: Jenna Luo Other Clinician: Referring Sari Cogan: Treating Abbegayle Denault/Extender: Michel Bickers in Treatment: 6 Vital Signs Height(in): 64 Pulse(bpm): 22 Weight(lbs): 228 Blood Pressure(mmHg): 124/82 Body Mass Index(BMI): 39.1 Temperature(F): 97.9 Respiratory  Rate(breaths/min): 18 Photos: [2:Right, Plantar Foot] [N/A:N/A N/A] Wound Location: [2:Gradually Appeared] [N/A:N/A] Wounding Event: [2:Neuropathic Ulcer-Non Diabetic] [N/A:N/A] Primary Etiology: [2:Asthma, Sleep Apnea, Hypertension, N/A] Comorbid History: [2:Osteoarthritis, Neuropathy, Seizure Disorder 07/12/2021] [N/A:N/A] Date Acquired: [2:6] [N/A:N/A] Weeks of Treatment: [2:Open] [N/A:N/A] Wound Status: [2:No] [N/A:N/A] Wound Recurrence: [2:0.6x0.5x0.1] [N/A:N/A] Measurements L x W x D (cm) [2:0.236] [N/A:N/A] A (cm) : rea [2:0.024] [N/A:N/A] Volume (cm) : [2:74.90%] [N/A:N/A] % Reduction in Area: [2:97.90%] [N/A:N/A] % Reduction in Volume: [2:Full Thickness Without Exposed] [N/A:N/A] Classification: [2:Support Structures Medium] [N/A:N/A] Exudate Amount: [2:Serosanguineous] [N/A:N/A] Exudate Type: [2:red, brown] [N/A:N/A] Exudate Color: [2:Well defined, not attached] [N/A:N/A] Wound  Margin: [2:Large (67-100%)] [N/A:N/A] Granulation Amount: [2:Red, Pink, Hyper-granulation] [N/A:N/A] Granulation Quality: [2:None Present (0%)] [N/A:N/A] Necrotic Amount: [2:Fat Layer (Subcutaneous Tissue): Yes N/A] Exposed Structures: [2:Fascia: No Tendon: No Muscle: No Joint: No Bone: No Medium (34-66%)] [N/A:N/A] Epithelialization: [2:Chemical Cauterization] [N/A:N/A] Procedures Performed: [2:T Contact Cast otal] Treatment Notes Electronic Signature(s) Signed: 09/08/2021 9:06:11 AM By: Kalman Shan DO Signed: 09/08/2021 4:09:03 PM By: Lorrin Jackson Entered By: Kalman Shan on 09/08/2021 08:24:49 -------------------------------------------------------------------------------- Multi-Disciplinary Care Plan Details Patient Name: Date of Service: [name] [name] D. 09/08/2021 8:00 A M Medical Record Number: [id number] Patient Account Number: [id number] Date of Birth/Sex: Treating RN: 11/05/1965 (56 y.o. [name] Primary Care Melodi Happel: Jenna Luo Other Clinician: Referring  Trev Boley: Treating Oreste Majeed/Extender: Michel Bickers in Treatment: 6 Active Inactive Wound/Skin Impairment Nursing Diagnoses: Impaired tissue integrity Goals: Patient/caregiver will verbalize understanding of skin care regimen Date Initiated: 07/23/2021 Target Resolution Date: 10/05/2021 Goal Status: Active Ulcer/skin breakdown will have a volume reduction of 30% by week 4 Date Initiated: 07/23/2021 Date Inactivated: 09/08/2021 Target Resolution Date: 08/20/2021 Goal Status: Met Ulcer/skin breakdown will have a volume reduction of 50% by week 8 Date Initiated: 09/08/2021 Target Resolution Date: 10/05/2021 Goal Status: Active Interventions: Assess patient/caregiver ability to obtain necessary supplies Assess patient/caregiver ability to perform ulcer/skin care regimen upon admission and as needed Assess ulceration(s) every visit Provide education on ulcer and skin care Treatment Activities: Topical wound management initiated : 07/23/2021 Notes: 09/08/21: Wound greater than 30% volume reduction. Wound care regimen continues. Electronic Signature(s) Signed: 09/08/2021 4:09:03 PM By: Lorrin Jackson Entered By: Lorrin Jackson on 09/08/2021 08:13:56 -------------------------------------------------------------------------------- Pain Assessment Details Patient Name: Date of Service: [name] [name] D. 09/08/2021 8:00 A M Medical Record Number: [id number] Patient Account Number: [id number] Date of Birth/Sex: Treating RN: [birth year] (56 y.o. [name] Primary Care Shristi Scheib: Jenna Luo Other Clinician: Referring Ashland Wiseman: Treating Tanga Gloor/Extender: Michel Bickers in Treatment: 6 Active Problems Location of Pain Severity and Description of Pain Patient Has Paino No Site Locations Pain Management and Medication Current Pain Management: Electronic Signature(s) Signed: 09/08/2021 4:09:03 PM By: Lorrin Jackson Entered By:  Lorrin Jackson on 09/08/2021 08:12:22 -------------------------------------------------------------------------------- Patient/Caregiver Education Details Patient Name: Date of Service: [name] [name], [name] D. 5/23/2023andnbsp8:00 A M Medical Record Number: [id number] Patient Account Number: [id number] Date of Birth/Gender: Treating RN: 12/21/1965 (56 y.o. [name] Primary Care Physician: Jenna Luo Other Clinician: Referring Physician: Treating Physician/Extender: Michel Bickers in Treatment: 6 Education Assessment Education Provided To: Patient Education Topics Provided Offloading: Methods: Explain/Verbal, Printed Responses: State content correctly Wound/Skin Impairment: Methods: Explain/Verbal, Printed Responses: State content correctly Electronic Signature(s) Signed: 09/08/2021 4:09:03 PM By: Lorrin Jackson Entered By: Lorrin Jackson on 09/08/2021 08:14:18 -------------------------------------------------------------------------------- Wound Assessment Details Patient Name: Date of Service: [name] [name] D. 09/08/2021 8:00 A M Medical Record Number: [id number] Patient Account Number: [id number] Date of Birth/Sex: Treating RN: Jul 16, 1965 (56 y.o. [name] Primary Care Charee Tumblin: Jenna Luo Other Clinician: Referring Johnluke Haugen: Treating Jameya Pontiff/Extender: Michel Bickers in Treatment: 6 Wound Status Wound Number: 2 Primary Neuropathic Ulcer-Non Diabetic Etiology: Wound Location: Right, Plantar Foot Wound Open Wounding Event: Gradually Appeared Status: Date Acquired: 07/12/2021 Comorbid Asthma, Sleep Apnea, Hypertension, Osteoarthritis, Weeks Of Treatment: 6 History: Neuropathy, Seizure Disorder Clustered Wound: No Photos Wound Measurements Length: (cm) 0.6 Width: (cm) 0.5 Depth: (cm) 0.1 Area: (cm) 0.236 Volume: (cm) 0.024 Wound Description Classification: Full Thickness Without Exposed  Support Struct Wound Margin: Well defined, not attached Exudate Amount: Medium Exudate Type: Serosanguineous Exudate  Color: red, brown ures Foul Odor After Cleansing: Slough/Fibrino % Reduction in Area: 74.9% % Reduction in Volume: 97.9% Epithelialization: Medium (34-66%) Tunneling: No Undermining: No No No Wound Bed Granulation Amount: Large (67-100%) Exposed Structure Granulation Quality: Red, Pink, Hyper-granulation Fascia Exposed: No Necrotic Amount: None Present (0%) Fat Layer (Subcutaneous Tissue) Exposed: Yes Tendon Exposed: No Muscle Exposed: No Joint Exposed: No Bone Exposed: No Treatment Notes Wound #2 (Foot) Wound Laterality: Plantar, Right Cleanser Soap and Water Discharge Instruction: May shower and wash wound with dial antibacterial soap and water prior to dressing change. Peri-Wound Care Topical Primary Dressing Hydrofera Blue Ready Foam, 2.5 x2.5 in Discharge Instruction: Apply to wound bed as instructed Secondary Dressing ABD Pad, 5x9 Discharge Instruction: Apply over primary dressing as directed. Optifoam Non-Adhesive Dressing, 4x4 in Discharge Instruction: Apply as donut Woven Gauze Sponge, Non-Sterile 4x4 in Discharge Instruction: Apply over primary dressing as directed. Secured With Compression Wrap Compression Stockings Environmental education officer) Signed: 09/08/2021 4:09:03 PM By: Lorrin Jackson Entered By: Lorrin Jackson on 09/08/2021 08:11:05 -------------------------------------------------------------------------------- Vitals Details Patient Name: Date of Service: [name] [name] D. 09/08/2021 8:00 A M Medical Record Number: [id number] Patient Account Number: [id number] Date of Birth/Sex: Treating RN: 11/15/1965 (56 y.o. [name] Primary Care Rickita Forstner: Jenna Luo Other Clinician: Referring Leonel Mccollum: Treating Aladdin Kollmann/Extender: Michel Bickers in Treatment: 6 Vital Signs Time Taken:  07:59 Temperature (F): 97.9 Height (in): 64 Pulse (bpm): 78 Weight (lbs): 228 Respiratory Rate (breaths/min): 18 Body Mass Index (BMI): 39.1 Blood Pressure (mmHg): 124/82 Reference Range: 80 - 120 mg / dl Electronic Signature(s) Signed: 09/08/2021 4:09:03 PM By: Lorrin Jackson Entered By: Lorrin Jackson on 09/08/2021 08:01:23

## 2021-09-15 ENCOUNTER — Encounter (HOSPITAL_BASED_OUTPATIENT_CLINIC_OR_DEPARTMENT_OTHER): Payer: Medicare Other | Admitting: Internal Medicine

## 2021-09-15 DIAGNOSIS — E11621 Type 2 diabetes mellitus with foot ulcer: Secondary | ICD-10-CM | POA: Diagnosis not present

## 2021-09-15 DIAGNOSIS — L97512 Non-pressure chronic ulcer of other part of right foot with fat layer exposed: Secondary | ICD-10-CM

## 2021-09-15 DIAGNOSIS — M1990 Unspecified osteoarthritis, unspecified site: Secondary | ICD-10-CM | POA: Diagnosis not present

## 2021-09-15 DIAGNOSIS — G9009 Other idiopathic peripheral autonomic neuropathy: Secondary | ICD-10-CM | POA: Diagnosis not present

## 2021-09-15 NOTE — Progress Notes (Signed)
[name], [name] ([id number]) Visit Report for 09/15/2021 Chief Complaint Document Details Patient Name: Date of Service: [name], [name] 09/15/2021 11:00 A M Medical Record Number: [id number] Patient Account Number: [id number] Date of Birth/Sex: Treating RN: Sep 01, 1965 (56 y.o. [name] Primary Care Provider: Jenna Luo Other Clinician: Referring Provider: Treating Provider/Extender: Michel Bickers in Treatment: 7 Information Obtained from: Patient Chief Complaint 06/03/2019; patient is here for review of a wound on her plantar foot transmetatarsal amputation site at roughly the third met head 07/23/2021; patient presents for right plantar foot wound Electronic Signature(s) Signed: 09/15/2021 12:32:00 PM By: [name] Shan DO Entered By: [name] [name] on 09/15/2021 12:18:58 -------------------------------------------------------------------------------- HPI Details Patient Name: Date of Service: [name] Shutter D. 09/15/2021 11:00 A M Medical Record Number: [id number] Patient Account Number: [id number] Date of Birth/Sex: Treating RN: [birth year] (56 y.o. [name] Primary Care Provider: Jenna Luo Other Clinician: Referring Provider: Treating Provider/Extender: Michel Bickers in Treatment: 7 History of Present Illness HPI Description: ADMISSION 06/02/2020 This is a 56 year old woman who has had problems with wounds on her predominantly plantar right foot for quite a period of time. 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 More recently she has had an area developed in the right mid foot at roughly the third metatarsal head. She says this started as a blister of November. Its not been closing. More recently she has been using Silvadene cream she has a cam boot to offload. I note she walks with a  cane The patient has had arterial studies done in 2019 at which time her ABI in the right was 1.28 with triphasic waveforms noncompressible on the left with triphasic waveform. Her ABI in our clinic today was 1.3 on the right. 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 She had a foot gastrocnemius recession sometime in the fall 2021 2/21; patient arrives today with a wound measuring slightly smaller 3 x 3 mm however there is circumferential area of erythema around this. She does not have any complaints 2/28; plantar wound on the right first transmetatarsal amputation site. Small oval-shaped wound raised skin around the edges. We have been using Hydrofera Blue. She does not have arterial issues 06/23/2020 upon evaluation today patient appears to be doing decently well. With regard to her wound. There is little bit of slough and some hyper granulation. I do not feel like the PolyMem did too well for her. In fact she may do better with some Hydrofera Blue. I know we just switch from that but I feel like that may be a better way to continue currently. 3/15; this is a patient with a wound on the plantar right transmetatarsal amputation site probably the second metatarsal head not 1 July as stated previously we have been using polymen switch to [facility] last week. Wound looks about the same to me. We are going to start her on a total contact cast today we have the boot to fit 3/18; first total contact cast change she tolerated this well 3/31; she comes back in with out total contact cast on for 10 days. In spite of this the small wound over the third metatarsal head of her right transmetatarsal amputation site almost looks the same slightly hyper granulated. Circumference  is not well adhered. We have been using Hydrofera Blue 4/7 she comes back in in her total contact cast. The wound  is smaller epithelializing and looks like it on its way to closing. We have been using Hydrofera Blue over the wound area She has a right transmetatarsal amputation. She tells [place] that she has been to [facility] in the past and they did a modified shoe for her however it did not keep this area epithelialized. She is going to bring that in next week for [place] to look at. It does not sound however that she was really offloading or padding this area at all 4/14; total contact cast. I changed her to calcium alginate today. I think most of the small area is epithelialized although there is clearly not complete epithelialization and even the area that is epithelialized and the small wound is very tiny She shows me her shoe with the insert. It looks as though the insert probably was too large for her transmet it foot allowing it to move back and forward. I can see where the friction was in the blister she describes forming. I have asked her to take this back to [facility] to see if there is anything they can do 4/21; patient presents for her 1 week follow-up. She has been treated with a total contact cast weekly. She has no complaints today. She denies any fever/chills. 4/26; patient presents for 1 week follow-up in total contact cast change. Patient is having knee pain to her right knee due to chronic osteoarthritis. She is hoping to be able to wear her brace over the cast. 5/10; the wound is closed over however still looks vulnerable. 5/17; the wound remains closed surface looks better. She has a previous right transmet. She is going to convert this into a sneaker. She had a forefoot prosthesis for the foot but not sure that that are too much good she is supposed to have taken aback from modification however she can do that now. 6/8; patient returns to our clinic for reopening of the plantar right foot transmetatarsal amputation site. She has slight warmth and erythema to the area.  She would like to try conservative wound care and not do the total contact cast that was she was doing before at this time 10/01/2020 upon evaluation today patient appears to be doing okay in regard to her foot ulcer. Fortunately there does not appear to be any signs of active infection which is great news and overall very pleased with where things stand today. The wound does appear to be doing well and overall appearance wise is significantly improved compared to last week in my opinion based on my review of her pictures today. Overall the patient tells me that she is not having any significant pain which is great news as well. Readmission 07/23/2021 Ms. 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 This is a reoccurring wound for the patient. She has been following with Dr. Earleen Newport for this issue. She had a wound culture that grew Staph aureus sensitive to Bactrim and she is currently on this. She has been using Silvadene to the wound bed. She has a cam walker boot and an offloading scooter that she uses. She currently denies signs of infection. 4/13; this is a  patient that is a type II diabetic complicated by peripheral neuropathy. She has had a previous transmetatarsal amputation of the right foot. She has a new wound on the plantar right foot between the first and second met heads probably close to the lateral part of the first met head. Generally small clean wound however from 7-9 o'clock there is a probing area close to bone. She finished a course of trimethoprim/sulfamethoxazole yesterday for methicillin- resistant Staph aureus in the wound. I believe this was ordered by podiatry she has been using a Dakin's wet-to-dry dressing. She uses a cam boot and a knee scooter. She says she is religious with  this 4/20; patient presents for follow-up. She continues to use Dakin's wet-to-dry dressings without issues. She states she would like to try the total contact cast that she has had this in the past. She currently denies signs of infection. 4/24; patient presents for follow-up. She would like the total contact cast placed today. She has been using Dakin's wet-to-dry dressings without issues. She denies signs of infection. 08-12-2021 patient presents today for obligatory first cast change. This was placed on Monday today is Wednesday. She seems to be doing quite well with using Hydrofera Blue and the overall appearance of the wound bed is excellent. We do need to replace the cast the good news is there is nothing that seems to be rubbing at this point which is awesome. 5/1; patient presents for follow-up. She has tolerated the total contact cast well. She has no issues or complaints today. 5/16; patient presents for follow-up. She states she missed her last week appointment due to having 2 appointments at the same time. She admits to knowing she is not supposed to have the contact cast for over 7 days. She currently denies signs of infection. She has no issues or complaints today. 5/23; patient presents for follow-up. She has no issues or complaints pertaining to the total contact cast. We have been using Hydrofera Blue. She denies signs of infection. 5/30; patient presents for follow-up. She tolerated the total contact cast well over the past week. We have been using Hydrofera Blue under the cast. She has no issues or complaints today. Electronic Signature(s) Signed: 09/15/2021 12:32:00 PM By: [name] Shan DO Entered By: [name] [name] on 09/15/2021 12:19:32 -------------------------------------------------------------------------------- Chemical Cauterization Details Patient Name: Date of Service: [name] Shutter D. 09/15/2021 11:00 A M Medical Record Number: [id number] Patient Account  Number: [id number] Date of Birth/Sex: Treating RN: 1965/06/10 (56 y.o. [name] Primary Care Provider: Jenna Luo Other Clinician: Referring Provider: Treating Provider/Extender: Michel Bickers in Treatment: 7 Procedure Performed for: Wound #2 Right,Plantar Foot Performed By: Physician [name] Shan, DO Post Procedure Diagnosis Same as Pre-procedure Electronic Signature(s) Signed: 09/15/2021 12:32:00 PM By: [name] Shan DO Signed: 09/15/2021 4:27:43 PM By: Lorrin Jackson Entered By: Lorrin Jackson on 09/15/2021 12:10:11 -------------------------------------------------------------------------------- Physical Exam Details Patient Name: Date of Service: [name] Shutter D. 09/15/2021 11:00 A M Medical Record Number: [id number] Patient Account Number: [id number] Date of Birth/Sex: Treating RN: December 17, 1965 (56 y.o. [name] Primary Care Provider: Jenna Luo Other Clinician: Referring Provider: Treating Provider/Extender: Michel Bickers in Treatment: 7 Constitutional respirations regular, non-labored and within target range for patient.. Cardiovascular 2+ dorsalis pedis/posterior tibialis pulses. Psychiatric pleasant and cooperative. Notes Right foot: T the plantar aspect there is an open wound with hyper granulated tissue. No tunneling or undermining noted. No signs of surrounding infection. o Electronic Signature(s) Signed: 09/15/2021 12:32:00 PM By: [name] Shan  DO Entered By: [name] [name] on 09/15/2021 12:19:59 -------------------------------------------------------------------------------- Physician Orders Details Patient Name: Date of Service: MARUA, QIN 09/15/2021 11:00 A M Medical Record Number: [id number] Patient Account Number: [id number] Date of Birth/Sex: Treating RN: [birth year] (56 y.o. [name] Primary Care Provider: Jenna Luo Other Clinician: Referring  Provider: Treating Provider/Extender: Michel Bickers in Treatment: 7 Verbal / Phone Orders: No Diagnosis Coding ICD-10 Coding Code Description [phone] Non-pressure chronic ulcer of other part of right foot with fat layer exposed E11.621 Type 2 diabetes mellitus with foot ulcer G90.09 Other idiopathic peripheral autonomic neuropathy Follow-up Appointments ppointment in 1 week. - on Tuesday 09/22/21 @ 2:30pm w/ Dr. Heber [place] and Leveda Anna Room # 7 Return A *****EXTRA TIME TCC*** Bathing/ Shower/ Hygiene May shower and wash wound with soap and water. - when changing dressing Edema Control - Lymphedema / SCD / Other Avoid standing for long periods of time. Off-Loading Total Contact Cast to Right Lower Extremity Other: - Continue to use knee scooter Wound Treatment Wound #2 - Foot Wound Laterality: Plantar, Right Cleanser: Soap and Water 1 x Per Week/30 Days Discharge Instructions: May shower and wash wound with dial antibacterial soap and water prior to dressing change. Prim Dressing: Hydrofera Blue Ready Foam, 2.5 x2.5 in 1 x Per Week/30 Days ary Discharge Instructions: Apply to wound bed as instructed Secondary Dressing: ABD Pad, 5x9 1 x Per Week/30 Days Discharge Instructions: Apply over primary dressing as directed. Secondary Dressing: Optifoam Non-Adhesive Dressing, 4x4 in 1 x Per Week/30 Days Discharge Instructions: Apply as donut Secondary Dressing: Woven Gauze Sponge, Non-Sterile 4x4 in 1 x Per Week/30 Days Discharge Instructions: Apply over primary dressing as directed. Electronic Signature(s) Signed: 09/15/2021 12:32:00 PM By: [name] Shan DO Entered By: [name] [name] on 09/15/2021 12:20:08 -------------------------------------------------------------------------------- Problem List Details Patient Name: Date of Service: [name] Shutter D. 09/15/2021 11:00 A M Medical Record Number: [id number] Patient Account Number: [id number] Date of  Birth/Sex: Treating RN: [birth year] (56 y.o. [name] Primary Care Provider: Jenna Luo Other Clinician: Referring Provider: Treating Provider/Extender: Michel Bickers in Treatment: 7 Active Problems ICD-10 Encounter Code Description Active Date MDM Diagnosis L97.512 Non-pressure chronic ulcer of other part of right foot with fat layer exposed 07/23/2021 No Yes E11.621 Type 2 diabetes mellitus with foot ulcer 07/23/2021 No Yes G90.09 Other idiopathic peripheral autonomic neuropathy 07/23/2021 No Yes Inactive Problems Resolved Problems Electronic Signature(s) Signed: 09/15/2021 12:32:00 PM By: [name] Shan DO Entered By: [name] [name] on 09/15/2021 12:18:32 -------------------------------------------------------------------------------- Progress Note Details Patient Name: Date of Service: [name] Shutter D. 09/15/2021 11:00 A M Medical Record Number: [id number] Patient Account Number: [id number] Date of Birth/Sex: Treating RN: May 13, 1965 (56 y.o. [name] Primary Care Provider: Jenna Luo Other Clinician: Referring Provider: Treating Provider/Extender: Michel Bickers in Treatment: 7 Subjective Chief Complaint Information obtained from Patient 06/03/2019; patient is here for review of a wound on her plantar foot transmetatarsal amputation site at roughly the third met head 07/23/2021; patient presents for right plantar foot wound History of Present Illness (HPI) ADMISSION 06/02/2020 This is a 56 year old woman who has had problems with wounds on her predominantly plantar right foot for quite a period of time. 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 More recently she has had an area developed in the right mid foot at roughly the third metatarsal head.  She says this started as a blister of November.  Its not been closing. More recently she has been using Silvadene cream she has a cam boot to offload. I note she walks with a cane The patient has had arterial studies done in 2019 at which time her ABI in the right was 1.28 with triphasic waveforms noncompressible on the left with triphasic waveform. Her ABI in our clinic today was 1.3 on the right. 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 She had a foot gastrocnemius recession sometime in the fall 2021 2/21; patient arrives today with a wound measuring slightly smaller 3 x 3 mm however there is circumferential area of erythema around this. She does not have any complaints 2/28; plantar wound on the right first transmetatarsal amputation site. Small oval-shaped wound raised skin around the edges. We have been using Hydrofera Blue. She does not have arterial issues 06/23/2020 upon evaluation today patient appears to be doing decently well. With regard to her wound. There is little bit of slough and some hyper granulation. I do not feel like the PolyMem did too well for her. In fact she may do better with some Hydrofera Blue. I know we just switch from that but I feel like that may be a better way to continue currently. 3/15; this is a patient with a wound on the plantar right transmetatarsal amputation site probably the second metatarsal head not 1 July as stated previously we have been using polymen switch to [facility] last week. Wound looks about the same to me. We are going to start her on a total contact cast today we have the boot to fit 3/18; first total contact cast change she tolerated this well 3/31; she comes back in with out total contact cast on for 10 days. In spite of this the small wound over the third metatarsal head of her right transmetatarsal amputation site almost looks the same slightly hyper granulated.  Circumference is not well adhered. We have been using Hydrofera Blue 4/7 she comes back in in her total contact cast. The wound is smaller epithelializing and looks like it on its way to closing. We have been using Hydrofera Blue over the wound area She has a right transmetatarsal amputation. She tells [place] that she has been to [facility] in the past and they did a modified shoe for her however it did not keep this area epithelialized. She is going to bring that in next week for [place] to look at. It does not sound however that she was really offloading or padding this area at all 4/14; total contact cast. I changed her to calcium alginate today. I think most of the small area is epithelialized although there is clearly not complete epithelialization and even the area that is epithelialized and the small wound is very tiny She shows me her shoe with the insert. It looks as though the insert probably was too large for her transmet it foot allowing it to move back and forward. I can see where the friction was in the blister she describes forming. I have asked her to take this back to [facility] to see if there is anything they can do 4/21; patient presents for her 1 week follow-up. She has been treated with a total contact cast weekly. She has no complaints today. She denies any fever/chills. 4/26; patient presents for 1  week follow-up in total contact cast change. Patient is having knee pain to her right knee due to chronic osteoarthritis. She is hoping to be able to wear her brace over the cast. 5/10; the wound is closed over however still looks vulnerable. 5/17; the wound remains closed surface looks better. She has a previous right transmet. She is going to convert this into a sneaker. She had a forefoot prosthesis for the foot but not sure that that are too much good she is supposed to have taken aback from modification however she can do that now. 6/8; patient returns to our clinic  for reopening of the plantar right foot transmetatarsal amputation site. She has slight warmth and erythema to the area. She would like to try conservative wound care and not do the total contact cast that was she was doing before at this time 10/01/2020 upon evaluation today patient appears to be doing okay in regard to her foot ulcer. Fortunately there does not appear to be any signs of active infection which is great news and overall very pleased with where things stand today. The wound does appear to be doing well and overall appearance wise is significantly improved compared to last week in my opinion based on my review of her pictures today. Overall the patient tells me that she is not having any significant pain which is great news as well. Readmission 07/23/2021 Ms. 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 This is a reoccurring wound for the patient. She has been following with Dr. Earleen Newport for this issue. She had a wound culture that grew Staph aureus sensitive to Bactrim and she is currently on this. She has been using Silvadene to the wound bed. She has a cam walker boot and an offloading scooter that she uses. She currently denies signs of infection. 4/13; this is a patient that is a type II diabetic complicated by peripheral neuropathy. She has had a previous transmetatarsal amputation of the right foot. She has a new wound on the plantar right foot between the first and second met heads probably close to the lateral part of the first met head. Generally small clean wound however from 7-9 o'clock there is a probing area close to bone. She finished a course of trimethoprim/sulfamethoxazole yesterday for methicillin- resistant Staph aureus in the wound. I believe this was ordered by podiatry she has  been using a Dakin's wet-to-dry dressing. She uses a cam boot and a knee scooter. She says she is religious with this 4/20; patient presents for follow-up. She continues to use Dakin's wet-to-dry dressings without issues. She states she would like to try the total contact cast that she has had this in the past. She currently denies signs of infection. 4/24; patient presents for follow-up. She would like the total contact cast placed today. She has been using Dakin's wet-to-dry dressings without issues. She denies signs of infection. 08-12-2021 patient presents today for obligatory first cast change. This was placed on Monday today is Wednesday. She seems to be doing quite well with using Hydrofera Blue and the overall appearance of the wound bed is excellent. We do need to replace the cast the good news is there is nothing that seems to be rubbing at this point which is awesome. 5/1; patient presents for follow-up. She has  tolerated the total contact cast well. She has no issues or complaints today. 5/16; patient presents for follow-up. She states she missed her last week appointment due to having 2 appointments at the same time. She admits to knowing she is not supposed to have the contact cast for over 7 days. She currently denies signs of infection. She has no issues or complaints today. 5/23; patient presents for follow-up. She has no issues or complaints pertaining to the total contact cast. We have been using Hydrofera Blue. She denies signs of infection. 5/30; patient presents for follow-up. She tolerated the total contact cast well over the past week. We have been using Hydrofera Blue under the cast. She has no issues or complaints today. Patient History Information obtained from Patient. Family History Cancer - Paternal Grandparents, Diabetes - Mother,Maternal Grandparents, Heart Disease - Mother,Paternal Grandparents,Maternal Grandparents,Father,Siblings, Hypertension - Mother,Father,  Lung Disease - Mother, No family history of Hereditary Spherocytosis, Kidney Disease, Seizures, Stroke, Thyroid Problems, Tuberculosis. Social History Never smoker, Marital Status - Widowed, Alcohol Use - Never, Drug Use - Prior History, Caffeine Use - Daily - soda's. Medical History Eyes Denies history of Cataracts, Glaucoma, Optic Neuritis Ear/Nose/Mouth/Throat Denies history of Chronic sinus problems/congestion, Middle ear problems Hematologic/Lymphatic Denies history of Anemia, Hemophilia, Human Immunodeficiency Virus, Lymphedema, Sickle Cell Disease Respiratory Patient has history of Asthma, Sleep Apnea Denies history of Aspiration, Chronic Obstructive Pulmonary Disease (COPD), Pneumothorax, Tuberculosis Cardiovascular Patient has history of Hypertension Denies history of Angina, Arrhythmia, Congestive Heart Failure, Coronary Artery Disease, Deep Vein Thrombosis, Hypotension, Myocardial Infarction, Peripheral Arterial Disease, Peripheral Venous Disease, Phlebitis, Vasculitis Gastrointestinal Denies history of Cirrhosis , Colitis, Crohnoos, Hepatitis A, Hepatitis B, Hepatitis C Endocrine Denies history of Type I Diabetes, Type II Diabetes Genitourinary Denies history of End Stage Renal Disease Immunological Denies history of Lupus Erythematosus, Raynaudoos, Scleroderma Integumentary (Skin) Denies history of History of Burn Musculoskeletal Patient has history of Osteoarthritis Denies history of Gout, Rheumatoid Arthritis, Osteomyelitis Neurologic Patient has history of Neuropathy, 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 - left 4th toe amp. - neck surgery. - spine surgery. - abdom. hysterectomy. - appendectomy. - lap. knee  surgery. Medical A Surgical History Notes nd Constitutional Symptoms (General Health) torn right shoulder rotator cuff. Gastrointestinal Hx of GERD Psychiatric hx of bipolar 1 disorder, anxiety, depression. Objective Constitutional respirations regular, non-labored and within target range for patient.. Vitals Time Taken: 11:31 AM, Height: 64 in, Weight: 228 lbs, BMI: 39.1, Temperature: 97.8 F, Pulse: 78 bpm, Respiratory Rate: 18 breaths/min, Blood Pressure: 144/85 mmHg. Cardiovascular 2+ dorsalis pedis/posterior tibialis pulses. Psychiatric pleasant and cooperative. General Notes: Right foot: T the plantar aspect there is an open wound with hyper granulated tissue. No tunneling or undermining noted. No signs of o surrounding infection. Integumentary (Hair, Skin) Wound #2 status is Open. Original cause of wound was Gradually Appeared. The date acquired was: 07/12/2021. The wound has been in treatment 7 weeks. The wound is located on the [place]. The wound measures 0.1cm length x 0.1cm width x 0.1cm depth; 0.008cm^2 area and 0.001cm^3 volume. There is Fat Layer (Subcutaneous Tissue) exposed. There is no tunneling or undermining noted. There is a medium amount of serosanguineous drainage noted. The wound margin is well defined and not attached to the wound base. There is large (67-100%) red, pink, hyper - granulation within the  wound bed. There is no necrotic tissue within the wound bed. Assessment Active Problems ICD-10 Non-pressure chronic ulcer of other part of right foot with fat layer exposed Type 2 diabetes mellitus with foot ulcer Other idiopathic peripheral autonomic neuropathy Patient's wound has shown improvement in size in appearance since last clinic visit. It is almost healed. I used silver nitrate over the hyper granulated areas. I recommended continuing Hydrofera Blue and the total contact cast. I am hopeful she will be healed in a week. Procedures Wound  #2 Pre-procedure diagnosis of Wound #2 is a Neuropathic Ulcer-Non Diabetic located on the [place] . There was a T Programmer, multimedia Procedure by Cephas Darby, Anjali Manzella, DO. Post procedure Diagnosis Wound #2: Same as Pre-Procedure Pre-procedure diagnosis of Wound #2 is a Neuropathic Ulcer-Non Diabetic located on the [place] . An Chemical Cauterization procedure was performed by [name] Shan, DO. Post procedure Diagnosis Wound #2: Same as Pre-Procedure Plan Follow-up Appointments: Return Appointment in 1 week. - on Tuesday 09/22/21 @ 2:30pm w/ Dr. Heber Santiago and Leveda Anna Room # 7 *****EXTRA TIME TCC*** Bathing/ Shower/ Hygiene: May shower and wash wound with soap and water. - when changing dressing Edema Control - Lymphedema / SCD / Other: Avoid standing for long periods of time. Off-Loading: T Contact Cast to Right Lower Extremity otal Other: - Continue to use knee scooter WOUND #2: - Foot Wound Laterality: Plantar, Right Cleanser: Soap and Water 1 x Per Week/30 Days Discharge Instructions: May shower and wash wound with dial antibacterial soap and water prior to dressing change. Prim Dressing: Hydrofera Blue Ready Foam, 2.5 x2.5 in 1 x Per Week/30 Days ary Discharge Instructions: Apply to wound bed as instructed Secondary Dressing: ABD Pad, 5x9 1 x Per Week/30 Days Discharge Instructions: Apply over primary dressing as directed. Secondary Dressing: Optifoam Non-Adhesive Dressing, 4x4 in 1 x Per Week/30 Days Discharge Instructions: Apply as donut Secondary Dressing: Woven Gauze Sponge, Non-Sterile 4x4 in 1 x Per Week/30 Days Discharge Instructions: Apply over primary dressing as directed. 1. Silver nitrate 2. Hydrofera Blue 3. T contact cast placed in standard fashion otal Electronic Signature(s) Signed: 09/15/2021 12:32:00 PM By: [name] Shan DO Entered By: [name] [name] on 09/15/2021  12:21:06 -------------------------------------------------------------------------------- HxROS Details Patient Name: Date of Service: [name] Shutter D. 09/15/2021 11:00 A M Medical Record Number: [id number] Patient Account Number: [id number] Date of Birth/Sex: Treating RN: [birth year] (56 y.o. [name] Primary Care Provider: Jenna Luo Other Clinician: Referring Provider: Treating Provider/Extender: Michel Bickers in Treatment: 7 Information Obtained From Patient Constitutional Symptoms (General Health) Medical History: Past Medical History Notes: torn right shoulder rotator cuff. Eyes Medical History: Negative for: Cataracts; Glaucoma; Optic Neuritis Ear/Nose/Mouth/Throat Medical History: Negative for: Chronic sinus problems/congestion; Middle ear problems Hematologic/Lymphatic Medical History: Negative for: Anemia; Hemophilia; Human Immunodeficiency Virus; Lymphedema; Sickle Cell Disease Respiratory Medical History: Positive for: Asthma; Sleep Apnea Negative for: Aspiration; Chronic Obstructive Pulmonary Disease (COPD); Pneumothorax; Tuberculosis Cardiovascular Medical History: Positive for: Hypertension Negative for: Angina; Arrhythmia; Congestive Heart Failure; Coronary Artery Disease; Deep Vein Thrombosis; Hypotension; Myocardial Infarction; Peripheral Arterial Disease; Peripheral Venous Disease; Phlebitis; Vasculitis Gastrointestinal Medical History: Negative for: Cirrhosis ; Colitis; Crohns; Hepatitis A; Hepatitis B; Hepatitis C Past Medical History Notes: Hx of GERD Endocrine Medical History: Negative for: Type I Diabetes; Type II Diabetes Genitourinary Medical History: Negative for: End Stage Renal Disease Immunological Medical History: Negative for: Lupus Erythematosus; Raynauds; Scleroderma Integumentary (Skin) Medical History: Negative for: History of Burn Musculoskeletal Medical History: Positive for:  Osteoarthritis Negative  for: Gout; Rheumatoid Arthritis; Osteomyelitis Neurologic Medical History: Positive for: Neuropathy; Seizure Disorder - hx of seizures r/t tramadol Negative for: Dementia; Quadriplegia; Paraplegia Oncologic Medical History: Negative for: Received Chemotherapy; Received Radiation Psychiatric Medical History: Negative for: Anorexia/bulimia; Confinement Anxiety Past Medical History Notes: hx of bipolar 1 disorder, anxiety, depression. Immunizations Pneumococcal Vaccine: Received Pneumococcal Vaccination: Yes Received Pneumococcal Vaccination On or After 60th Birthday: No Immunization Notes: pt. doesn't remember last tetanus shot Implantable Devices None Hospitalization / Surgery History Type of Hospitalization/Surgery trasmet amp right foot left 4th toe amp neck surgery spine surgery abdom. hysterectomy appendectomy lap. knee surgery Family and Social History Cancer: Yes - Paternal Grandparents; Diabetes: Yes - Mother,Maternal Grandparents; Heart Disease: Yes - Mother,Paternal Grandparents,Maternal Grandparents,Father,Siblings; Hereditary Spherocytosis: No; Hypertension: Yes - Mother,Father; Kidney Disease: No; Lung Disease: Yes - Mother; Seizures: No; Stroke: No; Thyroid Problems: No; Tuberculosis: No; Never smoker; Marital Status - Widowed; Alcohol Use: Never; Drug Use: Prior History; Caffeine Use: Daily - soda's; Financial Concerns: No; Food, Clothing or Shelter Needs: No; Support System Lacking: No; Transportation Concerns: No Electronic Signature(s) Signed: 09/15/2021 12:32:00 PM By: [name] Shan DO Signed: 09/15/2021 4:27:43 PM By: Lorrin Jackson Entered By: [name] [name] on 09/15/2021 12:19:39 -------------------------------------------------------------------------------- Total Contact Cast Details Patient Name: Date of Service: [name] Shutter D. 09/15/2021 11:00 A M Medical Record Number: [id number] Patient Account Number:  [id number] Date of Birth/Sex: Treating RN: [birth year] (56 y.o. [name] Primary Care Provider: Jenna Luo Other Clinician: Referring Provider: Treating Provider/Extender: Michel Bickers in Treatment: 7 T Contact Cast Applied for Wound Assessment: otal Wound #2 Right,Plantar Foot Performed By: Physician [name] Shan, DO Post Procedure Diagnosis Same as Pre-procedure Electronic Signature(s) Signed: 09/15/2021 12:32:00 PM By: [name] Shan DO Signed: 09/15/2021 4:27:43 PM By: Lorrin Jackson Entered By: Lorrin Jackson on 09/15/2021 12:10:00 -------------------------------------------------------------------------------- SuperBill Details Patient Name: Date of Service: [name] 09/15/2021 Medical Record Number: [id number] Patient Account Number: [id number] Date of Birth/Sex: Treating RN: [birth year] (56 y.o. [name] Primary Care Provider: Jenna Luo Other Clinician: Referring Provider: Treating Provider/Extender: Michel Bickers in Treatment: 7 Diagnosis Coding ICD-10 Codes Code Description [phone] Non-pressure chronic ulcer of other part of right foot with fat layer exposed E11.621 Type 2 diabetes mellitus with foot ulcer G90.09 Other idiopathic peripheral autonomic neuropathy Facility Procedures CPT4 Code: 70929574 Description: [id number] - APPLY TOTAL CONTACT LEG CAST ICD-10 Diagnosis Description L97.512 Non-pressure chronic ulcer of other part of right foot with fat layer exposed Modifier: Quantity: 1 CPT4 Code: 70964383 Description: 81840 - CHEM CAUT GRANULATION TISS ICD-10 Diagnosis Description L97.512 Non-pressure chronic ulcer of other part of right foot with fat layer exposed Modifier: Quantity: 1 Physician Procedures : CPT4 Code Description Modifier [id number] - WC PHYS APPLY TOTAL CONTACT CAST ICD-10 Diagnosis Description L97.512 Non-pressure chronic ulcer of other part of right  foot with fat layer exposed Quantity: 1 Electronic Signature(s) Signed: 09/15/2021 12:32:00 PM By: [name] Shan DO Entered By: [name] [name] on 09/15/2021 12:21:31

## 2021-09-18 ENCOUNTER — Ambulatory Visit (INDEPENDENT_AMBULATORY_CARE_PROVIDER_SITE_OTHER): Payer: Medicare Other | Admitting: Family Medicine

## 2021-09-18 VITALS — BP 136/80 | HR 82 | Ht 64.0 in | Wt 240.0 lb

## 2021-09-18 DIAGNOSIS — Z0000 Encounter for general adult medical examination without abnormal findings: Secondary | ICD-10-CM | POA: Diagnosis not present

## 2021-09-18 DIAGNOSIS — I10 Essential (primary) hypertension: Secondary | ICD-10-CM | POA: Diagnosis not present

## 2021-09-18 NOTE — Progress Notes (Signed)
Subjective:    Patient ID: [name] [name], female    DOB: October 26, 1965, 56 y.o.   MRN: [id number]  HPI   Patient is a 56 year old Caucasian female here today for complete physical exam.  Her mammogram was last done in August 2022.  It is due in August of this year.  She schedules this for herself.  Her last colonoscopy was 2013.  That is due this year.  We discussed the colonoscopy versus Cologuard and she elects to perform Cologuard screening.  She already has the kit at home and she states that she plans to do that this weekend.  She does not require a Pap smear because she had a hysterectomy.  Reviewing her shot records, she is due for Shingrix.  She is due for a flu shot this fall.  She is also due for a COVID booster.  We discussed the pros and cons of the COVID booster and given her compromised immune system, I recommended that she get COVID booster.  Her last bone density test showed T score of -0.6 in 2019.  I will repeat that in 2024. Past Medical History:  Diagnosis Date   Anxiety    Arthritis    [employer] 02/08/2020   Asthma    mild intermittent   Asthma    [employer] 02/08/2020   Bipolar 1 disorder ([place])    ect treatments last treatment Sep 02 1011   Depression    Depression    [employer] 02/08/2020   Depression    [employer] 07/10/2020   GERD (gastroesophageal reflux disease)    Hypertension    Pre-diabetes    Seizures ([place])    last seizure was so long ago she can't remember.    Sleep apnea    wears CPAP, uncertain of setting   Substance abuse ([place])    [employer] 02/08/2020   Past Surgical History:  Procedure Laterality Date   ABDOMINAL HYSTERECTOMY     AMPUTATION TOE Left 02/02/2018   Procedure: AMPUTATION TOE Left 4th toe;  Surgeon: [name], DPM;  Location: [place];  Service: Podiatry;  Laterality: Left;   APPENDECTOMY N/A    [employer] 02/08/2020   BACK SURGERY     CARPAL TUNNEL RELEASE     x2   IRRIGATION AND DEBRIDEMENT ABSCESS Right 10/25/2020    Procedure: IRRIGATION AND DEBRIDEMENT ABSCESS OF FOOT AND APPLICATION OF GRAFT;  Surgeon: [name], DPM;  Location: [place];  Service: Podiatry;  Laterality: Right;   Laproscopic knee surgery     NECK SURGERY  03/15/2017   PLANTAR FASCIA RELEASE     x2   SPINE SURGERY N/A    [employer] 02/08/2020   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 8.5 g 3   busPIRone (BUSPAR) 15 MG tablet Take 15 mg by mouth 3 (three) times daily.   2   Calcium Carbonate-Vit D-Min (CALCIUM 1200 PO) Take 1,200 mg by mouth daily.      cyclobenzaprine (FLEXERIL) 10 MG tablet Take 10 mg by mouth 2 (  two) times daily as needed for muscle spasms.      DULoxetine (CYMBALTA) 60 MG capsule TAKE 1 CAPSULE BY MOUTH EVERY DAY 90 capsule 3   escitalopram (LEXAPRO) 10 MG tablet Take 10 mg by mouth daily.     estradiol (ESTRACE) 1 MG tablet Take 1 tablet (1 mg total) by mouth at bedtime. 90 tablet 3   fluticasone (FLONASE) 50 MCG/ACT nasal spray SPRAY 2 SPRAYS INTRO EACH NOSTRIL EVERY DAY (Patient taking differently: Place 2 sprays into both nostrils daily as needed for allergies.) 48 g 1   haloperidol (HALDOL) 5 MG tablet Take 1 tablet (5 mg total) by mouth at bedtime. For psychosis     lamoTRIgine (LAMICTAL) 25 MG tablet Take by mouth.     levocetirizine (XYZAL) 5 MG tablet Take 1 tablet (5 mg total) by mouth every evening. 90 tablet 3   meloxicam (MOBIC) 15 MG tablet Take 15 mg by mouth at bedtime.   2   metoprolol succinate (TOPROL-XL) 25 MG 24 hr tablet TAKE 3 TABLETS  BY MOUTH EVERY DAY (Patient taking differently: Take 25 mg by mouth daily.) 270 tablet 1   Multiple Vitamins-Minerals (MULTIVITAMIN WITH MINERALS) tablet Take 1 tablet by mouth daily.     ondansetron (ZOFRAN) 4 MG tablet Take 1 tablet (4 mg total) by mouth every 8 (eight) hours as needed for nausea or vomiting. 20 tablet 0   OVER THE COUNTER MEDICATION Take 1 capsule by mouth daily. Fennel Seed     oxyCODONE-acetaminophen (PERCOCET) 7.5-325 MG tablet Take 1 tablet by mouth 6 (six) times daily.     pantoprazole (PROTONIX) 40 MG tablet Take 1 tablet (40 mg total) by mouth 2 (two) times daily. 180 tablet 2   prazosin (MINIPRESS) 2 MG capsule Take 2 mg by mouth at bedtime.     pregabalin (LYRICA) 100 MG capsule Take 1 capsule (100 mg total) by mouth 2 (two) times daily. 90 capsule 3   promethazine (PHENERGAN) 25 MG tablet Take 1 tablet (25 mg total) by mouth every 8 (eight) hours as needed for nausea or vomiting. 20 tablet 0   solifenacin (VESICARE) 10 MG tablet Take 1 tablet (10 mg total) by mouth daily. 90 tablet 3   SUMAtriptan (IMITREX) 50 MG tablet TAKE 1 TAB EVERY 2 HOURS AS NEEDED FOR MIGRAINE. MAY REPEAT IN 2 HOURS IF PERSISTS OR RECURS (Patient taking differently: Take 50 mg by mouth every 2 (two) hours as needed for migraine.) 10 tablet 3   traZODone (DESYREL) 100 MG tablet Take 200 mg by mouth at bedtime.  0   famotidine (PEPCID) 40 MG tablet Take 1 tablet (40 mg total) by mouth at bedtime. (Patient not taking: Reported on 09/18/2021) 90 tablet 3   LINZESS 290 MCG CAPS capsule TAKE 1 CAPSULE BY MOUTH EVERY MORNING (Patient not taking: Reported on 09/18/2021) 90 capsule 3   loperamide (IMODIUM) 2 MG capsule Take 2 mg by mouth 4 (four) times daily as needed. (Patient not taking: Reported on 09/18/2021)     naloxone [facility]) nasal spray 4 mg/0.1 mL SMARTSIG:1 Puff(s) In Nostril Daily (Patient not taking: Reported on 09/18/2021)     potassium chloride SA (KLOR-CON M) 20 MEQ tablet TAKE 2 TABLETS BY MOUTH  EVERY DAY 180 tablet 1   sulfamethoxazole-trimethoprim (BACTRIM DS) 800-160 MG tablet Take 1 tablet by mouth 2 (two) times daily. (Patient not taking: Reported on 09/18/2021) 20 tablet 0   topiramate (TOPAMAX) 50 MG tablet Take 1 tablet (50 mg total) by  mouth 2 (two) times daily. 180 tablet 2   No current facility-administered medications on file prior to visit.   Allergies  Allergen Reactions   Other    Penicillins Hives and Rash    No anaphylactic or severe cutaneous Sx > 10 yr ago, no additional medical attention required Tolerates Keflex and Rocephin   Tetracyclines & Related Other (See Comments)    Syncope and put her "in a coma"   Tramadol Other (See Comments)    Seizures   Phenazopyridine Other (See Comments)    Unknown   Tetracycline Other (See Comments)    Syncope and "Put me in a coma"   Ciprofloxacin Rash and Itching   Codeine Itching and Rash   Estradiol Rash    Patches broke out the skin   Social History   Socioeconomic History   Marital status: Widowed    Spouse name: Not on file   Number of children: 3   Years of education: 63   Highest education level: Not on file  Occupational History   Occupation: Disabled  Tobacco Use   Smoking status: Never   Smokeless tobacco: Never  Vaping Use   Vaping Use: Never used  Substance and Sexual Activity   Alcohol use: No   Drug use: No   Sexual activity: Not on file  Other Topics Concern   Not on file  Social History Narrative   Patient drink 4 16oz caffeine drinks a day    Social Determinants of Health   Financial Resource Strain: Not on file  Food Insecurity: Not on file  Transportation Needs: Not on file  Physical Activity: Not on file  Stress: Not on file  Social Connections: Not on file  Intimate Partner Violence: Not on file   Family History  Problem Relation Age of Onset   Diabetes Mother    COPD Mother    Hypertension Mother    Hyperlipidemia Mother    Heart disease Father    Hyperlipidemia  Father    Hypertension Father    Cancer Father    Heart disease Brother    Heart disease Daughter    Heart disease Maternal Grandmother    Hypertension Maternal Grandmother    Heart disease Maternal Grandfather    Kidney cancer Paternal Grandmother    Heart disease Paternal Grandfather       Review of Systems  All other systems reviewed and are negative.     Objective:   Physical Exam Vitals reviewed.  Constitutional:      General: She is not in acute distress.    Appearance: She is well-developed. She is not diaphoretic.  HENT:     Head: Normocephalic and atraumatic.     Right Ear: External ear normal.     Left Ear: External ear normal.     Nose: Nose normal.     Mouth/Throat:     Pharynx: No oropharyngeal exudate.  Eyes:     General: No scleral icterus.       Right eye: No discharge.        Left eye: No discharge.     Conjunctiva/sclera: Conjunctivae normal.     Pupils: Pupils are equal, round, and reactive to light.  Neck:     Thyroid: No thyromegaly.     Vascular: No JVD.     Trachea: No tracheal deviation.  Cardiovascular:     Rate and Rhythm: Normal rate and regular rhythm.     Heart sounds: Normal heart sounds. No murmur heard.  No friction rub. No gallop.  Pulmonary:     Effort: Pulmonary effort is normal. No respiratory distress.     Breath sounds: Normal breath sounds. No stridor. No wheezing or rales.  Chest:     Chest wall: No tenderness.  Abdominal:     General: Bowel sounds are normal. There is no distension.     Palpations: Abdomen is soft. There is no mass.     Tenderness: There is no abdominal tenderness. There is no guarding or rebound.  Musculoskeletal:        General: No tenderness. Normal range of motion.     Cervical back: Normal range of motion and neck supple.  Lymphadenopathy:     Cervical: No cervical adenopathy.  Skin:    General: Skin is warm.     Coloration: Skin is not pale.     Findings: No erythema or rash.  Neurological:      Mental Status: She is alert and oriented to person, place, and time.     Cranial Nerves: No cranial nerve deficit.     Motor: No abnormal muscle tone.     Coordination: Coordination normal.     Deep Tendon Reflexes: Reflexes are normal and symmetric.  Psychiatric:        Behavior: Behavior normal.        Thought Content: Thought content normal.        Judgment: Judgment normal.          Assessment & Plan:  Benign essential HTN - Plan: CBC with Differential/Platelet, COMPLETE METABOLIC PANEL WITH GFR, Lipid panel  General medical exam Physical exam today is significant for a BMI of 41.  Recommended a low carbohydrate diet, calorie restriction, avoiding sodas, and trying to gradually increase exercise to achieve weight loss.  Blood pressure today is well controlled.  I will check a CBC, CMP, and fasting lipid panel.  Colonoscopy is due but the patient elects to perform a Cologuard.  I encouraged her to schedule her mammogram this fall.  Her pap smear is not due or required.  Bone density is not due until next year.  I did recommend the shingles vaccine, flu shot this fall, and a COVID booster.  Her psychiatrist is managing her bipolar.

## 2021-09-19 LAB — LIPID PANEL
Cholesterol: 187 mg/dL (ref <200)
HDL: 67 mg/dL (ref >=50)
LDL Cholesterol (Calc): 84 mg/dL (calc)
Non-HDL Cholesterol (Calc): 120 mg/dL (calc) (ref <130)
Total CHOL/HDL Ratio: 2.8 (calc) (ref <5.0)
Triglycerides: 271 mg/dL — ABNORMAL HIGH (ref <150)

## 2021-09-19 LAB — CBC WITH DIFFERENTIAL/PLATELET
Absolute Monocytes: 380 cells/uL (ref 200–950)
Basophils Absolute: 28 cells/uL (ref 0–200)
Basophils Relative: 0.5 %
Eosinophils Absolute: 143 cells/uL (ref 15–500)
Eosinophils Relative: 2.6 %
HCT: 39.2 % (ref 35.0–45.0)
Hemoglobin: 13 g/dL (ref 11.7–15.5)
Lymphs Abs: 1848 cells/uL (ref 850–3900)
MCH: 33 pg (ref 27.0–33.0)
MCHC: 33.2 g/dL (ref 32.0–36.0)
MCV: 99.5 fL (ref 80.0–100.0)
MPV: 9.2 fL (ref 7.5–12.5)
Monocytes Relative: 6.9 %
Neutro Abs: 3102 cells/uL (ref 1500–7800)
Neutrophils Relative %: 56.4 %
Platelets: 273 10*3/uL (ref 140–400)
RBC: 3.94 10*6/uL (ref 3.80–5.10)
RDW: 12.3 % (ref 11.0–15.0)
Total Lymphocyte: 33.6 %
WBC: 5.5 10*3/uL (ref 3.8–10.8)

## 2021-09-19 LAB — COMPLETE METABOLIC PANEL WITH GFR
AG Ratio: 1.3 (calc) (ref 1.0–2.5)
ALT: 11 U/L (ref 6–29)
AST: 12 U/L (ref 10–35)
Albumin: 3.8 g/dL (ref 3.6–5.1)
Alkaline phosphatase (APISO): 72 U/L (ref 37–153)
BUN: 16 mg/dL (ref 7–25)
CO2: 27 mmol/L (ref 20–32)
Calcium: 9.6 mg/dL (ref 8.6–10.4)
Chloride: 104 mmol/L (ref 98–110)
Creat: 0.78 mg/dL (ref 0.50–1.03)
Globulin: 3 g/dL (calc) (ref 1.9–3.7)
Glucose, Bld: 97 mg/dL (ref 65–99)
Potassium: 4.7 mmol/L (ref 3.5–5.3)
Sodium: 138 mmol/L (ref 135–146)
Total Bilirubin: 0.3 mg/dL (ref 0.2–1.2)
Total Protein: 6.8 g/dL (ref 6.1–8.1)
eGFR: 89 mL/min/{1.73_m2} (ref >=60)

## 2021-09-22 ENCOUNTER — Ambulatory Visit (HOSPITAL_BASED_OUTPATIENT_CLINIC_OR_DEPARTMENT_OTHER): Payer: Medicare Other | Admitting: Internal Medicine

## 2021-09-23 ENCOUNTER — Encounter (HOSPITAL_BASED_OUTPATIENT_CLINIC_OR_DEPARTMENT_OTHER): Payer: Medicare Other | Attending: Internal Medicine | Admitting: Internal Medicine

## 2021-09-23 DIAGNOSIS — Z89431 Acquired absence of right foot: Secondary | ICD-10-CM | POA: Diagnosis not present

## 2021-09-23 DIAGNOSIS — G9009 Other idiopathic peripheral autonomic neuropathy: Secondary | ICD-10-CM | POA: Insufficient documentation

## 2021-09-23 DIAGNOSIS — E1142 Type 2 diabetes mellitus with diabetic polyneuropathy: Secondary | ICD-10-CM | POA: Diagnosis not present

## 2021-09-23 DIAGNOSIS — J45909 Unspecified asthma, uncomplicated: Secondary | ICD-10-CM | POA: Insufficient documentation

## 2021-09-23 DIAGNOSIS — G4730 Sleep apnea, unspecified: Secondary | ICD-10-CM | POA: Insufficient documentation

## 2021-09-23 DIAGNOSIS — M1990 Unspecified osteoarthritis, unspecified site: Secondary | ICD-10-CM | POA: Diagnosis not present

## 2021-09-23 DIAGNOSIS — E11621 Type 2 diabetes mellitus with foot ulcer: Secondary | ICD-10-CM | POA: Diagnosis not present

## 2021-09-23 DIAGNOSIS — L97512 Non-pressure chronic ulcer of other part of right foot with fat layer exposed: Secondary | ICD-10-CM | POA: Diagnosis not present

## 2021-09-23 DIAGNOSIS — Z89422 Acquired absence of other left toe(s): Secondary | ICD-10-CM | POA: Diagnosis not present

## 2021-09-23 NOTE — Progress Notes (Signed)
[name], [name] ([id number]) Visit Report for 09/23/2021 HPI Details Patient Name: Date of Service: [name] [name], [name] [name] 09/23/2021 2:15 PM Medical Record Number: [id number] Patient Account Number: [id number] Date of Birth/Sex: Treating RN: [birth year] (56 y.o. [name] [name] Primary Care Provider: Jenna Luo Other Clinician: Referring Provider: Treating Provider/Extender: Rosezena Sensor in Treatment: 8 History of Present Illness HPI Description: ADMISSION 06/02/2020 This is a 56 year old woman who has had problems with wounds on her predominantly plantar right foot for quite a period of time. 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 More recently she has had an area developed in the right mid foot at roughly the third metatarsal head. She says this started as a blister of November. Its not been closing. More recently she has been using Silvadene cream she has a cam boot to offload. I note she walks with a cane The patient has had arterial studies done in 2019 at which time her ABI in the right was 1.28 with triphasic waveforms noncompressible on the left with triphasic waveform. Her ABI in our clinic today was 1.3 on the right. 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 She had a foot gastrocnemius recession sometime in the fall 2021 2/21; patient arrives today with a wound measuring slightly smaller 3 x 3 mm however there is circumferential area of erythema around this. She does not have any complaints 2/28; plantar wound on the right first transmetatarsal amputation site. Small oval-shaped wound raised skin around the edges. We have been using Hydrofera Blue. She does not have arterial issues 06/23/2020 upon evaluation today patient appears  to be doing decently well. With regard to her wound. There is little bit of slough and some hyper granulation. I do not feel like the PolyMem did too well for her. In fact she may do better with some Hydrofera Blue. I know we just switch from that but I feel like that may be a better way to continue currently. 3/15; this is a patient with a wound on the plantar right transmetatarsal amputation site probably the second metatarsal head not 1 July as stated previously we have been using polymen switch to [facility] last week. Wound looks about the same to me. We are going to start her on a total contact cast today we have the boot to fit 3/18; first total contact cast change she tolerated this well 3/31; she comes back in with out total contact cast on for 10 days. In spite of this the small wound over the third metatarsal head of her right transmetatarsal amputation site almost looks the same slightly hyper granulated. Circumference is not well adhered. We have been using Hydrofera Blue 4/7 she comes back in in her total contact cast. The wound is smaller epithelializing and looks like it on its way to closing. We have been using Hydrofera Blue over the wound area She has a right transmetatarsal amputation. She tells [place] that she has been to [facility] in the past and they did a modified shoe for her however it did not keep this area epithelialized. She is going to bring that in next week for [place] to look at. It does not sound however that she was really offloading or padding this area  at all 4/14; total contact cast. I changed her to calcium alginate today. I think most of the small area is epithelialized although there is clearly not complete epithelialization and even the area that is epithelialized and the small wound is very tiny She shows me her shoe with the insert. It looks as though the insert probably was too large for her transmet it foot allowing it to move back and forward. I  can see where the friction was in the blister she describes forming. I have asked her to take this back to [facility] to see if there is anything they can do 4/21; patient presents for her 1 week follow-up. She has been treated with a total contact cast weekly. She has no complaints today. She denies any fever/chills. 4/26; patient presents for 1 week follow-up in total contact cast change. Patient is having knee pain to her right knee due to chronic osteoarthritis. She is hoping to be able to wear her brace over the cast. 5/10; the wound is closed over however still looks vulnerable. 5/17; the wound remains closed surface looks better. She has a previous right transmet. She is going to convert this into a sneaker. She had a forefoot prosthesis for the foot but not sure that that are too much good she is supposed to have taken aback from modification however she can do that now. 6/8; patient returns to our clinic for reopening of the plantar right foot transmetatarsal amputation site. She has slight warmth and erythema to the area. She would like to try conservative wound care and not do the total contact cast that was she was doing before at this time 10/01/2020 upon evaluation today patient appears to be doing okay in regard to her foot ulcer. Fortunately there does not appear to be any signs of active infection which is great news and overall very pleased with where things stand today. The wound does appear to be doing well and overall appearance wise is significantly improved compared to last week in my opinion based on my review of her pictures today. Overall the patient tells me that she is not having any significant pain which is great news as well. Readmission 07/23/2021 Ms. 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 This is a reoccurring wound for the patient. She has been following with Dr. Earleen Newport for this issue. She had a wound culture that grew Staph aureus sensitive to Bactrim and she is currently on this. She has been using Silvadene to the wound bed. She has a cam walker boot and an offloading scooter that she uses. She currently denies signs of infection. 4/13; this is a patient that is a type II diabetic complicated by peripheral neuropathy. She has had a previous transmetatarsal amputation of the right foot. She has a new wound on the plantar right foot between the first and second met heads probably close to the lateral part of the first met head. Generally small clean wound however from 7-9 o'clock there is a probing area close to bone. She finished a course of trimethoprim/sulfamethoxazole yesterday for methicillin- resistant Staph aureus in the wound. I believe this was ordered by podiatry she has been using a Dakin's wet-to-dry dressing. She uses a cam boot and a knee scooter. She says she is  religious with this 4/20; patient presents for follow-up. She continues to use Dakin's wet-to-dry dressings without issues. She states she would like to try the total contact cast that she has had this in the past. She currently denies signs of infection. 4/24; patient presents for follow-up. She would like the total contact cast placed today. She has been using Dakin's wet-to-dry dressings without issues. She denies signs of infection. 08-12-2021 patient presents today for obligatory first cast change. This was placed on Monday today is Wednesday. She seems to be doing quite well with using Hydrofera Blue and the overall appearance of the wound bed is excellent. We do need to replace the cast the good news is there is nothing that seems to be rubbing at this point which is awesome. 5/1; patient presents for follow-up. She has tolerated the total contact cast well. She has no issues or  complaints today. 5/16; patient presents for follow-up. She states she missed her last week appointment due to having 2 appointments at the same time. She admits to knowing she is not supposed to have the contact cast for over 7 days. She currently denies signs of infection. She has no issues or complaints today. 5/23; patient presents for follow-up. She has no issues or complaints pertaining to the total contact cast. We have been using Hydrofera Blue. She denies signs of infection. 5/30; patient presents for follow-up. She tolerated the total contact cast well over the past week. We have been using Hydrofera Blue under the cast. She has no issues or complaints today. 6/7; total contact cast Hydrofera Blue. Right plantar foot TMA site Electronic Signature(s) Signed: 09/23/2021 5:09:19 PM By: Linton Ham MD Entered By: Linton Ham on 09/23/2021 15:27:29 -------------------------------------------------------------------------------- Paring/cutting 1 benign hyperkeratotic lesion Details Patient Name: Date of Service: SHANICA, CASTELLANOS 09/23/2021 2:15 PM Medical Record Number: [id number] Patient Account Number: [id number] Date of Birth/Sex: Treating RN: [birth year] (56 y.o. [name] [name] Primary Care Provider: Jenna Luo Other Clinician: Referring Provider: Treating Provider/Extender: Rosezena Sensor in Treatment: 8 Procedure Performed for: Wound #2 Right,Plantar Foot Performed By: Physician Ricard Dillon., MD Post Procedure Diagnosis Same as Pre-procedure Notes curette used. Electronic Signature(s) Signed: 09/23/2021 5:09:19 PM By: Linton Ham MD Entered By: Linton Ham on 09/23/2021 15:26:52 -------------------------------------------------------------------------------- Physical Exam Details Patient Name: Date of Service: [name] 09/23/2021 2:15 PM Medical Record Number: [id number] Patient Account Number: [id number] Date of  Birth/Sex: Treating RN: [birth year] (56 y.o. [name] [name] Primary Care Provider: Jenna Luo Other Clinician: Referring Provider: Treating Provider/Extender: Rosezena Sensor in Treatment: 8 Constitutional Sitting or standing Blood Pressure is within target range for patient.. Pulse regular and within target range for patient.Marland Kitchen Respirations regular, non-labored and within target range.. Temperature is normal and within the target range for the patient.Marland Kitchen Appears in no distress. Notes Wound exam right foot. Plantar aspect. Eschar over the surface of this. I gently remove this with a #3 curette underneath this there is a wound area that is epithelializing but not completely mature. No infection no wound remains open Electronic Signature(s) Signed: 09/23/2021 5:09:19 PM By: Linton Ham MD Entered By: Linton Ham on 09/23/2021 81:82:99 -------------------------------------------------------------------------------- Physician Orders Details Patient Name: Date of Service: [name] 09/23/2021 2:15 PM Medical Record Number: [id number] Patient Account Number: [id number] Date of Birth/Sex: Treating RN: 08-29-1965 (56 y.o. [name] [name] Primary Care Provider: Jenna Luo Other Clinician: Referring Provider: Treating Provider/Extender: Rosezena Sensor in Treatment: 8 Verbal / Phone  Orders: No Diagnosis Coding ICD-10 Coding Code Description L97.512 Non-pressure chronic ulcer of other part of right foot with fat layer exposed E11.621 Type 2 diabetes mellitus with foot ulcer G90.09 Other idiopathic peripheral autonomic neuropathy Follow-up Appointments ppointment in 1 week. - on Thursday 10/01/21 @ 1100am w/ Dr. Heber [place] and Leveda Anna Room # 7 Return A *****EXTRA TIME TCC*** Bathing/ Shower/ Hygiene May shower and wash wound with soap and water. - when changing dressing Edema Control - Lymphedema / SCD / Other Avoid standing for  long periods of time. Off-Loading Total Contact Cast to Right Lower Extremity Other: - Continue to use knee scooter Wound Treatment Wound #2 - Foot Wound Laterality: Plantar, Right Cleanser: Soap and Water 1 x Per Week/30 Days Discharge Instructions: May shower and wash wound with dial antibacterial soap and water prior to dressing change. Secondary Dressing: Optifoam Non-Adhesive Dressing, 4x4 in 1 x Per Week/30 Days Secured With: 66M Medipore H Soft Cloth Surgical T ape, 4 x 10 (in/yd) 1 x Per Week/30 Days Discharge Instructions: Secure with tape as directed. Electronic Signature(s) Signed: 09/23/2021 5:06:31 PM By: Deon Pilling RN, BSN Signed: 09/23/2021 5:09:19 PM By: Linton Ham MD Entered By: Deon Pilling on 09/23/2021 14:58:06 -------------------------------------------------------------------------------- Problem List Details Patient Name: Date of Service: [name] 09/23/2021 2:15 PM Medical Record Number: [id number] Patient Account Number: [id number] Date of Birth/Sex: Treating RN: Feb 04, 1966 (56 y.o. [name] [name] Primary Care Provider: Jenna Luo Other Clinician: Referring Provider: Treating Provider/Extender: Rosezena Sensor in Treatment: 8 Active Problems ICD-10 Encounter Code Description Active Date MDM Diagnosis L97.512 Non-pressure chronic ulcer of other part of right foot with fat layer exposed 07/23/2021 No Yes E11.621 Type 2 diabetes mellitus with foot ulcer 07/23/2021 No Yes G90.09 Other idiopathic peripheral autonomic neuropathy 07/23/2021 No Yes Inactive Problems Resolved Problems Electronic Signature(s) Signed: 09/23/2021 5:09:19 PM By: Linton Ham MD Entered By: Linton Ham on 09/23/2021 15:26:37 -------------------------------------------------------------------------------- Progress Note Details Patient Name: Date of Service: [name] 09/23/2021 2:15 PM Medical Record Number: [id number] Patient Account  Number: [id number] Date of Birth/Sex: Treating RN: [birth year] (56 y.o. [name] [name] Primary Care Provider: Jenna Luo Other Clinician: Referring Provider: Treating Provider/Extender: Rosezena Sensor in Treatment: 8 Subjective History of Present Illness (HPI) ADMISSION 06/02/2020 This is a 56 year old woman who has had problems with wounds on her predominantly plantar right foot for quite a period of time. 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 More recently she has had an area developed in the right mid foot at roughly the third metatarsal head. She says this started as a blister of November. Its not been closing. More recently she has been using Silvadene cream she has a cam boot to offload. I note she walks with a cane The patient has had arterial studies done in 2019 at which time her ABI in the right was 1.28 with triphasic waveforms noncompressible on the left with triphasic waveform. Her ABI in our clinic today was 1.3 on the right. 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 She had a foot gastrocnemius recession sometime in the fall 2021 2/21; patient arrives today with a wound measuring slightly smaller 3 x 3 mm however there is circumferential area of erythema around this. She does  not have any complaints 2/28; plantar wound on the right first transmetatarsal amputation site. Small oval-shaped wound raised skin around the edges. We have been using Hydrofera Blue. She does not have arterial issues 06/23/2020 upon evaluation today patient appears to be doing decently well. With regard to her wound. There is little bit of slough and some hyper granulation. I do not feel like the PolyMem did too well for her. In fact she may do better  with some Hydrofera Blue. I know we just switch from that but I feel like that may be a better way to continue currently. 3/15; this is a patient with a wound on the plantar right transmetatarsal amputation site probably the second metatarsal head not 1 July as stated previously we have been using polymen switch to [facility] last week. Wound looks about the same to me. We are going to start her on a total contact cast today we have the boot to fit 3/18; first total contact cast change she tolerated this well 3/31; she comes back in with out total contact cast on for 10 days. In spite of this the small wound over the third metatarsal head of her right transmetatarsal amputation site almost looks the same slightly hyper granulated. Circumference is not well adhered. We have been using Hydrofera Blue 4/7 she comes back in in her total contact cast. The wound is smaller epithelializing and looks like it on its way to closing. We have been using Hydrofera Blue over the wound area She has a right transmetatarsal amputation. She tells [place] that she has been to [facility] in the past and they did a modified shoe for her however it did not keep this area epithelialized. She is going to bring that in next week for [place] to look at. It does not sound however that she was really offloading or padding this area at all 4/14; total contact cast. I changed her to calcium alginate today. I think most of the small area is epithelialized although there is clearly not complete epithelialization and even the area that is epithelialized and the small wound is very tiny She shows me her shoe with the insert. It looks as though the insert probably was too large for her transmet it foot allowing it to move back and forward. I can see where the friction was in the blister she describes forming. I have asked her to take this back to [facility] to see if there is anything they can do 4/21; patient presents  for her 1 week follow-up. She has been treated with a total contact cast weekly. She has no complaints today. She denies any fever/chills. 4/26; patient presents for 1 week follow-up in total contact cast change. Patient is having knee pain to her right knee due to chronic osteoarthritis. She is hoping to be able to wear her brace over the cast. 5/10; the wound is closed over however still looks vulnerable. 5/17; the wound remains closed surface looks better. She has a previous right transmet. She is going to convert this into a sneaker. She had a forefoot prosthesis for the foot but not sure that that are too much good she is supposed to have taken aback from modification however she can do that now. 6/8; patient returns to our clinic for reopening of the plantar right foot transmetatarsal amputation site. She has slight warmth and erythema to the area. She would like to try conservative wound care and not do the  total contact cast that was she was doing before at this time 10/01/2020 upon evaluation today patient appears to be doing okay in regard to her foot ulcer. Fortunately there does not appear to be any signs of active infection which is great news and overall very pleased with where things stand today. The wound does appear to be doing well and overall appearance wise is significantly improved compared to last week in my opinion based on my review of her pictures today. Overall the patient tells me that she is not having any significant pain which is great news as well. Readmission 07/23/2021 Ms. 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 This is a reoccurring wound for the patient. She has been following with Dr. Earleen Newport for this issue. She had a wound culture that grew Staph aureus sensitive to  Bactrim and she is currently on this. She has been using Silvadene to the wound bed. She has a cam walker boot and an offloading scooter that she uses. She currently denies signs of infection. 4/13; this is a patient that is a type II diabetic complicated by peripheral neuropathy. She has had a previous transmetatarsal amputation of the right foot. She has a new wound on the plantar right foot between the first and second met heads probably close to the lateral part of the first met head. Generally small clean wound however from 7-9 o'clock there is a probing area close to bone. She finished a course of trimethoprim/sulfamethoxazole yesterday for methicillin- resistant Staph aureus in the wound. I believe this was ordered by podiatry she has been using a Dakin's wet-to-dry dressing. She uses a cam boot and a knee scooter. She says she is religious with this 4/20; patient presents for follow-up. She continues to use Dakin's wet-to-dry dressings without issues. She states she would like to try the total contact cast that she has had this in the past. She currently denies signs of infection. 4/24; patient presents for follow-up. She would like the total contact cast placed today. She has been using Dakin's wet-to-dry dressings without issues. She denies signs of infection. 08-12-2021 patient presents today for obligatory first cast change. This was placed on Monday today is Wednesday. She seems to be doing quite well with using Hydrofera Blue and the overall appearance of the wound bed is excellent. We do need to replace the cast the good news is there is nothing that seems to be rubbing at this point which is awesome. 5/1; patient presents for follow-up. She has tolerated the total contact cast well. She has no issues or complaints today. 5/16; patient presents for follow-up. She states she missed her last week appointment due to having 2 appointments at the same time. She admits to knowing she is not  supposed to have the contact cast for over 7 days. She currently denies signs of infection. She has no issues or complaints today. 5/23; patient presents for follow-up. She has no issues or complaints pertaining to the total contact cast. We have been using Hydrofera Blue. She denies signs of infection. 5/30; patient presents for follow-up. She tolerated the total contact cast well over the past week. We have been using Hydrofera Blue under the cast. She has no issues or complaints today. 6/7; total contact cast Hydrofera Blue. Right plantar foot TMA  site Objective Constitutional Sitting or standing Blood Pressure is within target range for patient.. Pulse regular and within target range for patient.Marland Kitchen Respirations regular, non-labored and within target range.. Temperature is normal and within the target range for the patient.Marland Kitchen Appears in no distress. Vitals Time Taken: 2:25 PM, Height: 64 in, Weight: 228 lbs, BMI: 39.1, Temperature: 98.2 F, Pulse: 86 bpm, Respiratory Rate: 18 breaths/min, Blood Pressure: 135/83 mmHg. General Notes: Wound exam right foot. Plantar aspect. Eschar over the surface of this. I gently remove this with a #3 curette underneath this there is a wound area that is epithelializing but not completely mature. No infection no wound remains open Integumentary (Hair, Skin) Wound #2 status is Open. Original cause of wound was Gradually Appeared. The date acquired was: 07/12/2021. The wound has been in treatment 8 weeks. The wound is located on the [place]. The wound measures 0cm length x 0cm width x 0cm depth; 0cm^2 area and 0cm^3 volume. There is no tunneling or undermining noted. There is a none present amount of drainage noted. The wound margin is well defined and not attached to the wound base. There is no granulation within the wound bed. There is no necrotic tissue within the wound bed. Assessment Active Problems ICD-10 Non-pressure chronic ulcer of other part  of right foot with fat layer exposed Type 2 diabetes mellitus with foot ulcer Other idiopathic peripheral autonomic neuropathy Procedures Wound #2 Pre-procedure diagnosis of Wound #2 is a Neuropathic Ulcer-Non Diabetic located on the [place] . There was a T Contact Cast Procedure by Deno Etienne., MD. Post procedure Diagnosis Wound #2: Same as Pre-Procedure Pre-procedure diagnosis of Wound #2 is a Neuropathic Ulcer-Non Diabetic located on the Right,Plantar Foot . An Paring/cutting 1 benign hyperkeratotic lesion procedure was performed by Ricard Dillon., MD. Post procedure Diagnosis Wound #2: Same as Pre-Procedure Notes: curette used. Plan Follow-up Appointments: Return Appointment in 1 week. - on Thursday 10/01/21 @ 1100am w/ Dr. Heber [place] and Leveda Anna Room # 7 *****EXTRA TIME TCC*** Bathing/ Shower/ Hygiene: May shower and wash wound with soap and water. - when changing dressing Edema Control - Lymphedema / SCD / Other: Avoid standing for long periods of time. Off-Loading: T Contact Cast to Right Lower Extremity otal Other: - Continue to use knee scooter WOUND #2: - Foot Wound Laterality: Plantar, Right Cleanser: Soap and Water 1 x Per Week/30 Days Discharge Instructions: May shower and wash wound with dial antibacterial soap and water prior to dressing change. Secondary Dressing: Optifoam Non-Adhesive Dressing, 4x4 in 1 x Per Week/30 Days Secured With: 40M Medipore H Soft Cloth Surgical T ape, 4 x 10 (in/yd) 1 x Per Week/30 Days Discharge Instructions: Secure with tape as directed. 1. We put foam on the bottom of her foot and put her back in a total contact cast 2. She has new shoes with the one on the right with a forefoot spacer. 3. I suspect she will probably be healed next week enough to transition her into her own footwear. The epithelialization was not mature enough to do that this week Electronic Signature(s) Signed: 09/23/2021 5:09:19 PM By: Linton Ham  MD Entered By: Linton Ham on 09/23/2021 15:30:16 -------------------------------------------------------------------------------- Total Contact Cast Details Patient Name: Date of Service: [name] 09/23/2021 2:15 PM Medical Record Number: [id number] Patient Account Number: [id number] Date of Birth/Sex: Treating RN: 06/09/1965 (56 y.o. [name] [name] Primary Care Provider: Jenna Luo Other Clinician: Referring Provider: Treating Provider/Extender: Rosezena Sensor in Treatment:  8 T Contact Cast Applied for Wound Assessment: otal Wound #2 Right,Plantar Foot Performed By: Physician Ricard Dillon., MD Post Procedure Diagnosis Same as Pre-procedure Electronic Signature(s) Signed: 09/23/2021 5:09:19 PM By: Linton Ham MD Entered By: Linton Ham on 09/23/2021 15:26:58 -------------------------------------------------------------------------------- SuperBill Details Patient Name: Date of Service: [name] 09/23/2021 Medical Record Number: [id number] Patient Account Number: [id number] Date of Birth/Sex: Treating RN: 1965-07-24 (56 y.o. 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

## 2021-09-26 DIAGNOSIS — M869 Osteomyelitis, unspecified: Secondary | ICD-10-CM | POA: Diagnosis not present

## 2021-09-26 DIAGNOSIS — G4733 Obstructive sleep apnea (adult) (pediatric): Secondary | ICD-10-CM | POA: Diagnosis not present

## 2021-09-26 DIAGNOSIS — R531 Weakness: Secondary | ICD-10-CM | POA: Diagnosis not present

## 2021-09-29 NOTE — Progress Notes (Signed)
[name] [name], [name] [name] ([id number]) Visit Report for 09/15/2021 Arrival Information Details Patient Name: Date of Service: NABIHA, PLANCK 09/15/2021 11:00 A M Medical Record Number: [id number] Patient Account Number: [id number] Date of Birth/Sex: Treating RN: 1965/09/26 (55 y.o. [name] Primary Care Juline Sanderford: Jenna Luo Other Clinician: Referring Damon Hargrove: Treating Elliott Quade/Extender: Michel Bickers in Treatment: 7 Visit Information History Since Last Visit Added or deleted any medications: No Patient Arrived: [name] [name] Any new allergies or adverse reactions: No Arrival Time: 11:30 Had a fall or experienced change in No Accompanied By: self activities of daily living that may affect Transfer Assistance: None risk of falls: Patient Identification Verified: Yes Signs or symptoms of abuse/neglect since last No Secondary Verification Process Completed: Yes visito Patient Requires Transmission-Based Precautions: No Hospitalized since last visit: No Patient Has Alerts: No Implantable device outside of the clinic No excluding cellular tissue based products placed in the center since last visit: Has Dressing in Place as Prescribed: Yes Has Footwear/Offloading in Place as Yes Prescribed: Right: Removable Cast Walker/Walking Boot Pain Present Now: Yes Electronic Signature(s) Signed: 09/29/2021 8:46:54 AM By: Erenest Blank Entered By: Erenest Blank on 09/15/2021 11:31:07 -------------------------------------------------------------------------------- Encounter Discharge Information Details Patient Name: Date of Service: [name] Shutter D. 09/15/2021 11:00 A M Medical Record Number: [id number] Patient Account Number: [id number] Date of Birth/Sex: Treating RN: May 28, 1965 (56 y.o. [name] Primary Care Kevontae Burgoon: Jenna Luo Other Clinician: Referring Velora Horstman: Treating Taylour Lietzke/Extender: Michel Bickers in Treatment:  7 Encounter Discharge Information Items Discharge Condition: Stable Ambulatory Status: [place] Discharge Destination: Home Transportation: Private Auto Schedule Follow-up Appointment: Yes Clinical Summary of Care: Provided on 09/15/2021 Form Type Recipient Paper Patient Patient Electronic Signature(s) Signed: 09/15/2021 12:45:24 PM By: Lorrin Jackson Entered By: Lorrin Jackson on 09/15/2021 12:45:24 -------------------------------------------------------------------------------- Lower Extremity Assessment Details Patient Name: Date of Service: [name] Shutter D. 09/15/2021 11:00 A M Medical Record Number: [id number] Patient Account Number: [id number] Date of Birth/Sex: Treating RN: 01-12-1966 (56 y.o. [name] Primary Care Nomie Buchberger: Jenna Luo Other Clinician: Referring Gregg Winchell: Treating Ziare Orrick/Extender: Michel Bickers in Treatment: 7 Edema Assessment Assessed: [Left: No] [Right: Yes] Edema: [Left: N] [Right: o] Calf Left: Right: Point of Measurement: 26 cm From Medial Instep 37.5 cm Ankle Left: Right: Point of Measurement: 10 cm From Medial Instep 22.5 cm Vascular Assessment Pulses: Dorsalis Pedis Palpable: [Right:Yes] Electronic Signature(s) Signed: 09/15/2021 4:27:43 PM By: Lorrin Jackson Entered By: Lorrin Jackson on 09/15/2021 12:05:17 -------------------------------------------------------------------------------- Multi Wound Chart Details Patient Name: Date of Service: [name] Shutter D. 09/15/2021 11:00 A M Medical Record Number: [id number] Patient Account Number: [id number] Date of Birth/Sex: Treating RN: October 25, 1965 (56 y.o. [name] Primary Care Cassundra Mckeever: Jenna Luo Other Clinician: Referring Artem Bunte: Treating Ashea Winiarski/Extender: Michel Bickers in Treatment: 7 Vital Signs Height(in): 64 Pulse(bpm): 82 Weight(lbs): 228 Blood Pressure(mmHg): 144/85 Body Mass Index(BMI):  39.1 Temperature(F): 97.8 Respiratory Rate(breaths/min): 18 Photos: [2:Right, Plantar Foot] [N/A:N/A N/A] Wound Location: [2:Gradually Appeared] [N/A:N/A] Wounding Event: [2:Neuropathic Ulcer-Non Diabetic] [N/A:N/A] Primary Etiology: [2:Asthma, Sleep Apnea, Hypertension, N/A] Comorbid History: [2:Osteoarthritis, Neuropathy, Seizure Disorder 07/12/2021] [N/A:N/A] Date Acquired: [2:7] [N/A:N/A] Weeks of Treatment: [2:Open] [N/A:N/A] Wound Status: [2:No] [N/A:N/A] Wound Recurrence: [2:0.1x0.1x0.1] [N/A:N/A] Measurements L x W x D (cm) [2:0.008] [N/A:N/A] A (cm) : rea [2:0.001] [N/A:N/A] Volume (cm) : [2:99.20%] [N/A:N/A] % Reduction in Area: [2:99.90%] [N/A:N/A] % Reduction in Volume: [2:Full Thickness Without Exposed] [N/A:N/A] Classification: [2:Support Structures Medium] [N/A:N/A] Exudate Amount: [2:Serosanguineous] [N/A:N/A] Exudate Type: [2:red, brown] [N/A:N/A] Exudate Color: [2:Well defined, not  attached] [N/A:N/A] Wound Margin: [2:Large (67-100%)] [N/A:N/A] Granulation Amount: [2:Red, Pink, Hyper-granulation] [N/A:N/A] Granulation Quality: [2:None Present (0%)] [N/A:N/A] Necrotic Amount: [2:Fat Layer (Subcutaneous Tissue): Yes N/A] Exposed Structures: [2:Fascia: No Tendon: No Muscle: No Joint: No Bone: No Large (67-100%)] [N/A:N/A] Epithelialization: [2:Chemical Cauterization] [N/A:N/A] Procedures Performed: [2:T Contact Cast otal] Treatment Notes Electronic Signature(s) Signed: 09/15/2021 12:32:00 PM By: Kalman Shan DO Signed: 09/15/2021 4:27:43 PM By: Lorrin Jackson Entered By: Kalman Shan on 09/15/2021 12:18:40 -------------------------------------------------------------------------------- Multi-Disciplinary Care Plan Details Patient Name: Date of Service: [name] Shutter D. 09/15/2021 11:00 A M Medical Record Number: [id number] Patient Account Number: [id number] Date of Birth/Sex: Treating RN: 10/21/1965 (56 y.o. [name] Primary Care Savon Cobbs:  Jenna Luo Other Clinician: Referring Noma Quijas: Treating Dasani Thurlow/Extender: Michel Bickers in Treatment: 7 Active Inactive Wound/Skin Impairment Nursing Diagnoses: Impaired tissue integrity Goals: Patient/caregiver will verbalize understanding of skin care regimen Date Initiated: 07/23/2021 Target Resolution Date: 10/05/2021 Goal Status: Active Ulcer/skin breakdown will have a volume reduction of 30% by week 4 Date Initiated: 07/23/2021 Date Inactivated: 09/08/2021 Target Resolution Date: 08/20/2021 Goal Status: Met Ulcer/skin breakdown will have a volume reduction of 50% by week 8 Date Initiated: 09/08/2021 Target Resolution Date: 10/05/2021 Goal Status: Active Interventions: Assess patient/caregiver ability to obtain necessary supplies Assess patient/caregiver ability to perform ulcer/skin care regimen upon admission and as needed Assess ulceration(s) every visit Provide education on ulcer and skin care Treatment Activities: Topical wound management initiated : 07/23/2021 Notes: 09/08/21: Wound greater than 30% volume reduction. Wound care regimen continues. Electronic Signature(s) Signed: 09/15/2021 4:27:43 PM By: Lorrin Jackson Entered By: Lorrin Jackson on 09/15/2021 12:14:20 -------------------------------------------------------------------------------- Pain Assessment Details Patient Name: Date of Service: [name] [name] 09/15/2021 11:00 A M Medical Record Number: [id number] Patient Account Number: [id number] Date of Birth/Sex: Treating RN: [birth year] (56 y.o. [name] Primary Care Elspeth Blucher: Jenna Luo Other Clinician: Referring Jiovanna Frei: Treating Jadin Creque/Extender: Michel Bickers in Treatment: 7 Active Problems Location of Pain Severity and Description of Pain Patient Has Paino Yes Site Locations Pain Location: Generalized Pain Rate the pain. Current Pain Level: 7 Pain Management and  Medication Current Pain Management: Electronic Signature(s) Signed: 09/15/2021 4:27:43 PM By: Lorrin Jackson Signed: 09/29/2021 8:46:54 AM By: Erenest Blank Entered By: Erenest Blank on 09/15/2021 11:31:38 -------------------------------------------------------------------------------- Patient/Caregiver Education Details Patient Name: Date of Service: Morency, Saiya D. 5/30/2023andnbsp11:00 A M Medical Record Number: [id number] Patient Account Number: [id number] Date of Birth/Gender: Treating RN: 1965/04/21 (56 y.o. [name] Primary Care Physician: Jenna Luo Other Clinician: Referring Physician: Treating Physician/Extender: Michel Bickers in Treatment: 7 Education Assessment Education Provided To: Patient Education Topics Provided Offloading: Methods: Explain/Verbal, Printed Responses: State content correctly Wound/Skin Impairment: Methods: Explain/Verbal, Printed Responses: State content correctly Electronic Signature(s) Signed: 09/15/2021 4:27:43 PM By: Lorrin Jackson Entered By: Lorrin Jackson on 09/15/2021 12:14:39 -------------------------------------------------------------------------------- Wound Assessment Details Patient Name: Date of Service: [name] Shutter D. 09/15/2021 11:00 A M Medical Record Number: [id number] Patient Account Number: [id number] Date of Birth/Sex: Treating RN: 12-16-1965 (56 y.o. [name] Primary Care Melven Stockard: Jenna Luo Other Clinician: Referring Sharice Harriss: Treating Darielys Giglia/Extender: Michel Bickers in Treatment: 7 Wound Status Wound Number: 2 Primary Neuropathic Ulcer-Non Diabetic Etiology: Wound Location: Right, Plantar Foot Wound Open Wounding Event: Gradually Appeared Status: Date Acquired: 07/12/2021 Comorbid Asthma, Sleep Apnea, Hypertension, Osteoarthritis, Weeks Of Treatment: 7 History: Neuropathy, Seizure Disorder Clustered Wound:  No Photos Wound Measurements Length: (cm) 0.1 Width: (cm) 0.1 Depth: (cm) 0.1 Area: (cm) 0.008 Volume: (cm) 0.001 %  Reduction in Area: 99.2% % Reduction in Volume: 99.9% Epithelialization: Large (67-100%) Tunneling: No Undermining: No Wound Description Classification: Full Thickness Without Exposed Support Structures Wound Margin: Well defined, not attached Exudate Amount: Medium Exudate Type: Serosanguineous Exudate Color: red, brown Foul Odor After Cleansing: No Slough/Fibrino No Wound Bed Granulation Amount: Large (67-100%) Exposed Structure Granulation Quality: Red, Pink, Hyper-granulation Fascia Exposed: No Necrotic Amount: None Present (0%) Fat Layer (Subcutaneous Tissue) Exposed: Yes Tendon Exposed: No Muscle Exposed: No Joint Exposed: No Bone Exposed: No Electronic Signature(s) Signed: 09/15/2021 4:27:43 PM By: Lorrin Jackson Entered By: Lorrin Jackson on 09/15/2021 12:04:52 -------------------------------------------------------------------------------- Vitals Details Patient Name: Date of Service: [name] Shutter D. 09/15/2021 11:00 A M Medical Record Number: [id number] Patient Account Number: [id number] Date of Birth/Sex: Treating RN: 10-23-1965 (56 y.o. [name] Primary Care Everlene Cunning: Jenna Luo Other Clinician: Referring Joline Encalada: Treating Becki Mccaskill/Extender: Michel Bickers in Treatment: 7 Vital Signs Time Taken: 11:31 Temperature (F): 97.8 Height (in): 64 Pulse (bpm): 78 Weight (lbs): 228 Respiratory Rate (breaths/min): 18 Body Mass Index (BMI): 39.1 Blood Pressure (mmHg): 144/85 Reference Range: 80 - 120 mg / dl Electronic Signature(s) Signed: 09/29/2021 8:46:54 AM By: Erenest Blank Entered By: Erenest Blank on 09/15/2021 11:31:27

## 2021-09-29 NOTE — Progress Notes (Signed)
[name] [name] ([id number]) Visit Report for 09/23/2021 Arrival Information Details Patient Name: Date of Service: [name] [name], [name] [name] 09/23/2021 2:15 PM Medical Record Number: [id number] Patient Account Number: [id number] Date of Birth/Sex: Treating RN: 27-Sep-1965 (56 y.o. Helene Shoe, Tammi Klippel Primary Care Windie Marasco: Jenna Luo Other Clinician: Referring Carney Saxton: Treating [name] [name]/Extender: Rosezena Sensor in Treatment: 8 Visit Information History Since Last Visit Added or deleted any medications: No Patient Arrived: [name] [name] Any new allergies or adverse reactions: No Arrival Time: 14:24 Had a fall or experienced change in No Accompanied By: self activities of daily living that may affect Transfer Assistance: None risk of falls: Patient Identification Verified: Yes Signs or symptoms of abuse/neglect since last No Secondary Verification Process Completed: Yes visito Patient Requires Transmission-Based Precautions: No Hospitalized since last visit: No Patient Has Alerts: No Implantable device outside of the clinic No excluding cellular tissue based products placed in the center since last visit: Has Dressing in Place as Prescribed: Yes Has Footwear/Offloading in Place as Yes Prescribed: Left: Removable Cast Walker/Walking Boot Pain Present Now: Yes Electronic Signature(s) Signed: 09/29/2021 8:46:10 AM By: Erenest Blank Entered By: Erenest Blank on 09/23/2021 14:25:29 -------------------------------------------------------------------------------- Encounter Discharge Information Details Patient Name: Date of Service: [name] Shutter D. 09/23/2021 2:15 PM Medical Record Number: [id number] Patient Account Number: [id number] Date of Birth/Sex: Treating RN: 1965-05-05 (56 y.o. [name] Primary Care Cierrah Dace: Jenna Luo Other Clinician: Referring Sherod Cisse: Treating [name] [name]/Extender: Rosezena Sensor in Treatment:  8 Encounter Discharge Information Items Discharge Condition: Stable Ambulatory Status: Cane Discharge Destination: Home Transportation: Private Auto Accompanied By: self Schedule Follow-up Appointment: Yes Clinical Summary of Care: Electronic Signature(s) Signed: 09/29/2021 8:46:10 AM By: Erenest Blank Entered By: Erenest Blank on 09/23/2021 16:56:03 -------------------------------------------------------------------------------- Lower Extremity Assessment Details Patient Name: Date of Service: [name] [name] 09/23/2021 2:15 PM Medical Record Number: [id number] Patient Account Number: [id number] Date of Birth/Sex: Treating RN: 11/11/1965 (56 y.o. [name] Primary Care Aris Moman: Jenna Luo Other Clinician: Referring Macarena Langseth: Treating [name] [name]/Extender: Rosezena Sensor in Treatment: 8 Edema Assessment Assessed: Shirlyn Goltz: No] [Right: No] Edema: [Left: N] [Right: o] Calf Left: Right: Point of Measurement: 26 cm From Medial Instep 38.9 cm Ankle Left: Right: Point of Measurement: 10 cm From Medial Instep 24.3 cm Vascular Assessment Pulses: Dorsalis Pedis Palpable: [Right:Yes] Electronic Signature(s) Signed: 09/23/2021 5:06:31 PM By: Deon Pilling RN, BSN Signed: 09/29/2021 8:46:10 AM By: Erenest Blank Entered By: Erenest Blank on 09/23/2021 14:47:05 -------------------------------------------------------------------------------- Multi Wound Chart Details Patient Name: Date of Service: [name] Shutter D. 09/23/2021 2:15 PM Medical Record Number: [id number] Patient Account Number: [id number] Date of Birth/Sex: Treating RN: 05/28/1965 (57 y.o. 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] [N/A:N/A] Primary Etiology: [2:Asthma, Sleep Apnea, Hypertension,] [N/A:N/A] Comorbid History: [2:Osteoarthritis, Neuropathy, Seizure Disorder 07/12/2021] [N/A:N/A] Date Acquired: [2:8] [N/A:N/A] Weeks of Treatment: [2:Open] [N/A:N/A] Wound Status: [2:No] [N/A:N/A] Wound Recurrence: [2:0x0x0] [N/A:N/A] Measurements L x W x D (cm) [2:0] [N/A:N/A] A (cm) : rea [2:0] [N/A:N/A] Volume (cm) : [2:100.00%] [N/A:N/A] % Reduction in Area: [2:100.00%] [N/A:N/A] % Reduction in Volume: [2:Full Thickness Without Exposed] [N/A:N/A] Classification: [2:Support Structures None Present] [N/A:N/A] Exudate Amount: [2:Well defined, not attached] [N/A:N/A] Wound Margin: [2:None Present (0%)] [N/A:N/A] Granulation  Amount: [2:None Present (0%)] [N/A:N/A] Necrotic Amount: [2:Fascia: No] [N/A:N/A] Exposed Structures: [2:Fat Layer (Subcutaneous Tissue): No Tendon: No Muscle: No Joint: No Bone: No Large (67-100%)] [N/A:N/A] Epithelialization: [2:Paring/cutting 1 benign hyperkeratotic] [N/A:N/A] Procedures Performed: [2:lesion T Contact Cast otal] Treatment Notes Electronic Signature(s) Signed: 09/23/2021 5:06:31 PM By: Deon Pilling RN, BSN Signed: 09/23/2021 5:09:19 PM By: Linton Ham MD Entered By: Linton Ham on 09/23/2021 15:26:42 -------------------------------------------------------------------------------- Multi-Disciplinary Care Plan Details Patient Name: Date of Service: [name] Shutter D. 09/23/2021 2:15 PM Medical Record Number: [id number] Patient Account Number: [id number] Date of Birth/Sex: Treating RN: 09/03/1965 (56 y.o. Helene Shoe, Tammi Klippel Primary Care Rani Sisney: Jenna Luo Other Clinician: Referring Zeidy Tayag: Treating [name] [name]/Extender: Rosezena Sensor  in Treatment: 8 Active Inactive Wound/Skin Impairment Nursing Diagnoses: Impaired tissue integrity Goals: Patient/caregiver will verbalize understanding of skin care regimen Date Initiated: 07/23/2021 Target Resolution Date: 10/05/2021 Goal Status: Active Ulcer/skin breakdown will have a volume reduction of 30% by week 4 Date Initiated: 07/23/2021 Date Inactivated: 09/08/2021 Target Resolution Date: 08/20/2021 Goal Status: Met Ulcer/skin breakdown will have a volume reduction of 50% by week 8 Date Initiated: 09/08/2021 Target Resolution Date: 10/05/2021 Goal Status: Active Interventions: Assess patient/caregiver ability to obtain necessary supplies Assess patient/caregiver ability to perform ulcer/skin care regimen upon admission and as needed Assess ulceration(s) every visit Provide education on ulcer and skin care Treatment Activities: Topical wound management initiated : 07/23/2021 Notes: 09/08/21: Wound greater than 30% volume reduction. Wound care regimen continues. Electronic Signature(s) Signed: 09/23/2021 5:06:31 PM By: Deon Pilling RN, BSN Entered By: Deon Pilling on 09/23/2021 14:58:12 -------------------------------------------------------------------------------- Pain Assessment Details Patient Name: Date of Service: [name] [name] 09/23/2021 2:15 PM Medical Record Number: [id number] Patient Account Number: [id number] Date of Birth/Sex: Treating RN: 1965-09-08 (56 y.o. [name] Primary Care Yonah Tangeman: Jenna Luo Other Clinician: Referring Summit Arroyave: Treating Shaela Boer/Extender: Rosezena Sensor in Treatment: 8 Active Problems Location of Pain Severity and Description of Pain Patient Has Paino Yes Site Locations Pain Location: Pain in Ulcers Rate the pain. Current Pain Level: 4 Pain Management and Medication Current Pain Management: Electronic Signature(s) Signed: 09/23/2021 5:06:31 PM By: Deon Pilling RN, BSN Signed: 09/29/2021  8:46:10 AM By: Erenest Blank Entered By: Erenest Blank on 09/23/2021 14:26:08 -------------------------------------------------------------------------------- Patient/Caregiver Education Details Patient Name: Date of Service: [name] [name] 6/7/2023andnbsp2:15 PM Medical Record Number: [id number] Patient Account Number: [id number] Date of Birth/Gender: Treating RN: 09/27/1965 (56 y.o. [name] Primary Care Physician: Jenna Luo Other Clinician: Referring Physician: Treating Physician/Extender: Rosezena Sensor in Treatment: 8 Education Assessment Education Provided To: Patient Education Topics Provided Wound/Skin Impairment: Handouts: Skin Care Do's and Dont's Methods: Explain/Verbal Responses: State content correctly Electronic Signature(s) Signed: 09/23/2021 5:06:31 PM By: Deon Pilling RN, BSN Entered By: Deon Pilling on 09/23/2021 14:58:23 -------------------------------------------------------------------------------- Wound Assessment Details Patient Name: Date of Service: [name] [name] 09/23/2021 2:15 PM Medical Record Number: [id number] Patient Account Number: [id number] Date of Birth/Sex: Treating RN: [birth year] (56 y.o. Helene Shoe, Meta.Reding Primary Care Rue Tinnel: Jenna Luo Other Clinician: Referring Lenah Messenger: Treating Shukri Nistler/Extender: Rosezena Sensor in Treatment: 8 Wound Status Wound Number: 2 Primary Neuropathic Ulcer-Non Diabetic Etiology: Wound Location: Right, Plantar Foot Wound Open Wounding Event: Gradually Appeared Status: Date Acquired: 07/12/2021 Comorbid Asthma, Sleep Apnea, Hypertension, Osteoarthritis, Weeks Of Treatment: 8 History: Neuropathy, Seizure Disorder Clustered Wound: No Photos Wound Measurements Length: (cm) Width: (cm) Depth: (cm) Area: (cm) Volume: (cm) 0 % Reduction in Area: 100% 0 % Reduction in Volume: 100% 0 Epithelialization: Large (67-100%)  0  Tunneling: No 0 Undermining: No Wound Description Classification: Full Thickness Without Exposed Support Structures Wound Margin: Well defined, not attached Exudate Amount: None Present Foul Odor After Cleansing: No Slough/Fibrino No Wound Bed Granulation Amount: None Present (0%) Exposed Structure Necrotic Amount: None Present (0%) Fascia Exposed: No Fat Layer (Subcutaneous Tissue) Exposed: No Tendon Exposed: No Muscle Exposed: No Joint Exposed: No Bone Exposed: No Treatment Notes Wound #2 (Foot) Wound Laterality: Plantar, Right Cleanser Soap and Water Discharge Instruction: May shower and wash wound with dial antibacterial soap and water prior to dressing change. Peri-Wound Care Topical Primary Dressing Secondary Dressing Optifoam Non-Adhesive Dressing, 4x4 in Secured With [facility] [employer] T ape, 4 x 10 (in/yd) Discharge Instruction: Secure with tape as directed. Compression Wrap Compression Stockings Add-Ons Electronic Signature(s) Signed: 09/23/2021 5:06:31 PM By: Deon Pilling RN, BSN Entered By: Deon Pilling on 09/23/2021 14:55:16 -------------------------------------------------------------------------------- Vitals Details Patient Name: Date of Service: [name] Shutter D. 09/23/2021 2:15 PM Medical Record Number: [id number] Patient Account Number: [id number] Date of Birth/Sex: Treating RN: Jun 21, 1965 (56 y.o. Helene Shoe, Tammi Klippel Primary Care Albi Rappaport: Jenna Luo Other Clinician: Referring Json Koelzer: Treating Japheth Diekman/Extender: Rosezena Sensor in Treatment: 8 Vital Signs Time Taken: 14:25 Temperature (F): 98.2 Height (in): 64 Pulse (bpm): 86 Weight (lbs): 228 Respiratory Rate (breaths/min): 18 Body Mass Index (BMI): 39.1 Blood Pressure (mmHg): 135/83 Reference Range: 80 - 120 mg / dl Electronic Signature(s) Signed: 09/29/2021 8:46:10 AM By: Erenest Blank Entered By: Erenest Blank on 09/23/2021 14:25:48

## 2021-09-30 DIAGNOSIS — G894 Chronic pain syndrome: Secondary | ICD-10-CM | POA: Diagnosis not present

## 2021-09-30 DIAGNOSIS — M25511 Pain in right shoulder: Secondary | ICD-10-CM | POA: Diagnosis not present

## 2021-09-30 DIAGNOSIS — Z79891 Long term (current) use of opiate analgesic: Secondary | ICD-10-CM | POA: Diagnosis not present

## 2021-10-01 ENCOUNTER — Encounter (HOSPITAL_BASED_OUTPATIENT_CLINIC_OR_DEPARTMENT_OTHER): Payer: Medicare Other | Admitting: Internal Medicine

## 2021-10-01 DIAGNOSIS — G4730 Sleep apnea, unspecified: Secondary | ICD-10-CM | POA: Diagnosis not present

## 2021-10-01 DIAGNOSIS — G9009 Other idiopathic peripheral autonomic neuropathy: Secondary | ICD-10-CM | POA: Diagnosis not present

## 2021-10-01 DIAGNOSIS — E11621 Type 2 diabetes mellitus with foot ulcer: Secondary | ICD-10-CM | POA: Diagnosis not present

## 2021-10-01 DIAGNOSIS — L97512 Non-pressure chronic ulcer of other part of right foot with fat layer exposed: Secondary | ICD-10-CM | POA: Diagnosis not present

## 2021-10-01 DIAGNOSIS — M1990 Unspecified osteoarthritis, unspecified site: Secondary | ICD-10-CM | POA: Diagnosis not present

## 2021-10-01 DIAGNOSIS — Z89431 Acquired absence of right foot: Secondary | ICD-10-CM | POA: Diagnosis not present

## 2021-10-01 DIAGNOSIS — E1142 Type 2 diabetes mellitus with diabetic polyneuropathy: Secondary | ICD-10-CM | POA: Diagnosis not present

## 2021-10-01 DIAGNOSIS — Z89422 Acquired absence of other left toe(s): Secondary | ICD-10-CM | POA: Diagnosis not present

## 2021-10-01 DIAGNOSIS — J45909 Unspecified asthma, uncomplicated: Secondary | ICD-10-CM | POA: Diagnosis not present

## 2021-10-01 NOTE — Progress Notes (Signed)
[name], [name] ([id number]) Visit Report for 10/01/2021 Chief Complaint Document Details Patient Name: Date of Service: [name] [name], [name] [name] 10/01/2021 11:00 A M Medical Record Number: [id number] Patient Account Number: [id number] Date of Birth/Sex: Treating RN: Mar 19, 1966 (56 y.o. [name] Primary Care Provider: Jenna Luo Other Clinician: Referring Provider: Treating Provider/Extender: Michel Bickers in Treatment: 10 Information Obtained from: Patient Chief Complaint 06/03/2019; patient is here for review of a wound on her plantar foot transmetatarsal amputation site at roughly the third met head 07/23/2021; patient presents for right plantar foot wound Electronic Signature(s) Signed: 10/01/2021 1:32:28 PM By: Kalman Shan DO Entered By: Kalman Shan on 10/01/2021 13:17:21 -------------------------------------------------------------------------------- HPI Details Patient Name: Date of Service: [name] Shutter D. 10/01/2021 11:00 A M Medical Record Number: [id number] Patient Account Number: [id number] Date of Birth/Sex: Treating RN: February 16, 1966 (56 y.o. [name] Primary Care Provider: Jenna Luo Other Clinician: Referring Provider: Treating Provider/Extender: Michel Bickers in Treatment: 10 History of Present Illness HPI Description: ADMISSION 06/02/2020 This is a 56 year old woman who has had problems with wounds on her predominantly plantar right foot for quite a period of time. 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 More recently she has had an area developed in the right mid foot at roughly the third metatarsal head. She says this started as a blister of November. Its not been closing. More recently she has been using Silvadene cream she has a cam boot to offload. I note she walks with a  cane The patient has had arterial studies done in 2019 at which time her ABI in the right was 1.28 with triphasic waveforms noncompressible on the left with triphasic waveform. Her ABI in our clinic today was 1.3 on the right. 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 She had a foot gastrocnemius recession sometime in the fall 2021 2/21; patient arrives today with a wound measuring slightly smaller 3 x 3 mm however there is circumferential area of erythema around this. She does not have any complaints 2/28; plantar wound on the right first transmetatarsal amputation site. Small oval-shaped wound raised skin around the edges. We have been using Hydrofera Blue. She does not have arterial issues 06/23/2020 upon evaluation today patient appears to be doing decently well. With regard to her wound. There is little bit of slough and some hyper granulation. I do not feel like the PolyMem did too well for her. In fact she may do better with some Hydrofera Blue. I know we just switch from that but I feel like that may be a better way to continue currently. 3/15; this is a patient with a wound on the plantar right transmetatarsal amputation site probably the second metatarsal head not 1 July as stated previously we have been using polymen switch to [facility] last week. Wound looks about the same to me. We are going to start her on a total contact cast today we have the boot to fit 3/18; first total contact cast change she tolerated this well 3/31; she comes back in with out total contact cast on for 10 days. In spite of this the small wound over the third metatarsal head of her right transmetatarsal amputation site almost looks the same slightly hyper granulated. Circumference  is not well adhered. We have been using Hydrofera Blue 4/7 she comes back in in her total contact cast. The wound  is smaller epithelializing and looks like it on its way to closing. We have been using Hydrofera Blue over the wound area She has a right transmetatarsal amputation. She tells [place] that she has been to [facility] in the past and they did a modified shoe for her however it did not keep this area epithelialized. She is going to bring that in next week for [place] to look at. It does not sound however that she was really offloading or padding this area at all 4/14; total contact cast. I changed her to calcium alginate today. I think most of the small area is epithelialized although there is clearly not complete epithelialization and even the area that is epithelialized and the small wound is very tiny She shows me her shoe with the insert. It looks as though the insert probably was too large for her transmet it foot allowing it to move back and forward. I can see where the friction was in the blister she describes forming. I have asked her to take this back to [facility] to see if there is anything they can do 4/21; patient presents for her 1 week follow-up. She has been treated with a total contact cast weekly. She has no complaints today. She denies any fever/chills. 4/26; patient presents for 1 week follow-up in total contact cast change. Patient is having knee pain to her right knee due to chronic osteoarthritis. She is hoping to be able to wear her brace over the cast. 5/10; the wound is closed over however still looks vulnerable. 5/17; the wound remains closed surface looks better. She has a previous right transmet. She is going to convert this into a sneaker. She had a forefoot prosthesis for the foot but not sure that that are too much good she is supposed to have taken aback from modification however she can do that now. 6/8; patient returns to our clinic for reopening of the plantar right foot transmetatarsal amputation site. She has slight warmth and erythema to the area.  She would like to try conservative wound care and not do the total contact cast that was she was doing before at this time 10/01/2020 upon evaluation today patient appears to be doing okay in regard to her foot ulcer. Fortunately there does not appear to be any signs of active infection which is great news and overall very pleased with where things stand today. The wound does appear to be doing well and overall appearance wise is significantly improved compared to last week in my opinion based on my review of her pictures today. Overall the patient tells me that she is not having any significant pain which is great news as well. Readmission 07/23/2021 Ms. 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 This is a reoccurring wound for the patient. She has been following with Dr. Earleen Newport for this issue. She had a wound culture that grew Staph aureus sensitive to Bactrim and she is currently on this. She has been using Silvadene to the wound bed. She has a cam walker boot and an offloading scooter that she uses. She currently denies signs of infection. 4/13; this is a  patient that is a type II diabetic complicated by peripheral neuropathy. She has had a previous transmetatarsal amputation of the right foot. She has a new wound on the plantar right foot between the first and second met heads probably close to the lateral part of the first met head. Generally small clean wound however from 7-9 o'clock there is a probing area close to bone. She finished a course of trimethoprim/sulfamethoxazole yesterday for methicillin- resistant Staph aureus in the wound. I believe this was ordered by podiatry she has been using a Dakin's wet-to-dry dressing. She uses a cam boot and a knee scooter. She says she is religious with  this 4/20; patient presents for follow-up. She continues to use Dakin's wet-to-dry dressings without issues. She states she would like to try the total contact cast that she has had this in the past. She currently denies signs of infection. 4/24; patient presents for follow-up. She would like the total contact cast placed today. She has been using Dakin's wet-to-dry dressings without issues. She denies signs of infection. 08-12-2021 patient presents today for obligatory first cast change. This was placed on Monday today is Wednesday. She seems to be doing quite well with using Hydrofera Blue and the overall appearance of the wound bed is excellent. We do need to replace the cast the good news is there is nothing that seems to be rubbing at this point which is awesome. 5/1; patient presents for follow-up. She has tolerated the total contact cast well. She has no issues or complaints today. 5/16; patient presents for follow-up. She states she missed her last week appointment due to having 2 appointments at the same time. She admits to knowing she is not supposed to have the contact cast for over 7 days. She currently denies signs of infection. She has no issues or complaints today. 5/23; patient presents for follow-up. She has no issues or complaints pertaining to the total contact cast. We have been using Hydrofera Blue. She denies signs of infection. 5/30; patient presents for follow-up. She tolerated the total contact cast well over the past week. We have been using Hydrofera Blue under the cast. She has no issues or complaints today. 6/7; total contact cast Hydrofera Blue. Right plantar foot TMA site 6/15; patient presents for follow-up. She has had the total contact cast with Hydrofera Blue for the past week. She has no issues or complaints today. Electronic Signature(s) Signed: 10/01/2021 1:32:28 PM By: Kalman Shan DO Entered By: Kalman Shan on 10/01/2021  13:17:42 -------------------------------------------------------------------------------- Physical Exam Details Patient Name: Date of Service: [name] Shutter D. 10/01/2021 11:00 A M Medical Record Number: [id number] Patient Account Number: [id number] Date of Birth/Sex: Treating RN: 12/18/1965 (56 y.o. [name] Primary Care Provider: Jenna Luo Other Clinician: Referring Provider: Treating Provider/Extender: Michel Bickers in Treatment: 10 Constitutional respirations regular, non-labored and within target range for patient.. Cardiovascular 2+ dorsalis pedis/posterior tibialis pulses. Psychiatric pleasant and cooperative. Notes Right foot: T the plantar aspect there is epithelization to the previous wound site however there is some protruding skin. Overall area appears closed. o Electronic Signature(s) Signed: 10/01/2021 1:32:28 PM By: Kalman Shan DO Entered By: Kalman Shan on 10/01/2021 13:18:39 -------------------------------------------------------------------------------- Physician Orders Details Patient Name: Date of Service: [name] Shutter D. 10/01/2021 11:00 A M Medical Record Number: [id number] Patient Account Number: [id number] Date of Birth/Sex: Treating RN: [birth year] (56 y.o. [name] Primary Care Provider: Jenna Luo Other Clinician: Referring Provider: Treating Provider/Extender: Michel Bickers in Treatment:  10 Verbal / Phone Orders: No Diagnosis Coding Follow-up Appointments ppointment in 1 week. - on 10/06/21 @ 8:00am w/ Dr. He[facility] and [facility] Room # 7 Return A Bathing/ Shower/ Hygiene May shower and wash wound with soap and water. - when changing dressing Edema Control - Lymphedema / SCD / Other Avoid standing for long periods of time. Off-Loading Other: - Continue to use knee scooter Wound Treatment Wound #2 - Foot Wound Laterality: Plantar, Right Cleanser: Soap and Water  Every Other Day/30 Days Discharge Instructions: May shower and wash wound with dial antibacterial soap and water prior to dressing change. Secondary Dressing: ALLEVYN Gentle Border, 3x3 (in/in) Every Other Day/30 Days Discharge Instructions: Apply over primary dressing as directed. Electronic Signature(s) Signed: 10/01/2021 1:32:28 PM By: Kalman Shan DO Entered By: Kalman Shan on 10/01/2021 13:18:52 -------------------------------------------------------------------------------- Problem List Details Patient Name: Date of Service: [name] Shutter D. 10/01/2021 11:00 A M Medical Record Number: [id number] Patient Account Number: [id number] Date of Birth/Sex: Treating RN: Nov 10, 1965 (56 y.o. [name] Primary Care Provider: Jenna Luo Other Clinician: Referring Provider: Treating Provider/Extender: Michel Bickers in Treatment: 10 Active Problems ICD-10 Encounter Code Description Active Date MDM Diagnosis L97.512 Non-pressure chronic ulcer of other part of right foot with fat layer exposed 07/23/2021 No Yes E11.621 Type 2 diabetes mellitus with foot ulcer 07/23/2021 No Yes G90.09 Other idiopathic peripheral autonomic neuropathy 07/23/2021 No Yes Inactive Problems Resolved Problems Electronic Signature(s) Signed: 10/01/2021 1:32:28 PM By: Kalman Shan DO Entered By: Kalman Shan on 10/01/2021 13:17:10 -------------------------------------------------------------------------------- Progress Note Details Patient Name: Date of Service: [name] Shutter D. 10/01/2021 11:00 A M Medical Record Number: [id number] Patient Account Number: [id number] Date of Birth/Sex: Treating RN: 10/10/1965 (56 y.o. [name] Primary Care Provider: Jenna Luo Other Clinician: Referring Provider: Treating Provider/Extender: Michel Bickers in Treatment: 10 Subjective Chief Complaint Information obtained from Patient 06/03/2019;  patient is here for review of a wound on her plantar foot transmetatarsal amputation site at roughly the third met head 07/23/2021; patient presents for right plantar foot wound History of Present Illness (HPI) ADMISSION 06/02/2020 This is a 56 year old woman who has had problems with wounds on her predominantly plantar right foot for quite a period of time. 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 More recently she has had an area developed in the right mid foot at roughly the third metatarsal head. She says this started as a blister of November. Its not been closing. More recently she has been using Silvadene cream she has a cam boot to offload. I note she walks with a cane The patient has had arterial studies done in 2019 at which time her ABI in the right was 1.28 with triphasic waveforms noncompressible on the left with triphasic waveform. Her ABI in our clinic today was 1.3 on the right. 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 She had a foot gastrocnemius recession sometime in the fall 2021 2/21; patient arrives today with a wound measuring slightly smaller 3 x 3 mm however there is circumferential area of erythema around this. She does not have any complaints 2/28; plantar wound on the right first transmetatarsal amputation site. Small oval-shaped wound raised skin around the edges. We have been using Hydrofera Blue. She does  not have arterial issues 06/23/2020 upon evaluation today patient appears to be doing decently well. With regard to her wound. There is little bit of slough and some hyper granulation. I do not feel like the PolyMem did too well for her. In fact she may do better with some Hydrofera Blue. I know we just switch from that but I feel like that  may be a better way to continue currently. 3/15; this is a patient with a wound on the plantar right transmetatarsal amputation site probably the second metatarsal head not 1 July as stated previously we have been using polymen switch to [facility] last week. Wound looks about the same to me. We are going to start her on a total contact cast today we have the boot to fit 3/18; first total contact cast change she tolerated this well 3/31; she comes back in with out total contact cast on for 10 days. In spite of this the small wound over the third metatarsal head of her right transmetatarsal amputation site almost looks the same slightly hyper granulated. Circumference is not well adhered. We have been using Hydrofera Blue 4/7 she comes back in in her total contact cast. The wound is smaller epithelializing and looks like it on its way to closing. We have been using Hydrofera Blue over the wound area She has a right transmetatarsal amputation. She tells [place] that she has been to [facility] in the past and they did a modified shoe for her however it did not keep this area epithelialized. She is going to bring that in next week for [place] to look at. It does not sound however that she was really offloading or padding this area at all 4/14; total contact cast. I changed her to calcium alginate today. I think most of the small area is epithelialized although there is clearly not complete epithelialization and even the area that is epithelialized and the small wound is very tiny She shows me her shoe with the insert. It looks as though the insert probably was too large for her transmet it foot allowing it to move back and forward. I can see where the friction was in the blister she describes forming. I have asked her to take this back to [facility] to see if there is anything they can do 4/21; patient presents for her 1 week follow-up. She has been treated with a total contact cast  weekly. She has no complaints today. She denies any fever/chills. 4/26; patient presents for 1 week follow-up in total contact cast change. Patient is having knee pain to her right knee due to chronic osteoarthritis. She is hoping to be able to wear her brace over the cast. 5/10; the wound is closed over however still looks vulnerable. 5/17; the wound remains closed surface looks better. She has a previous right transmet. She is going to convert this into a sneaker. She had a forefoot prosthesis for the foot but not sure that that are too much good she is supposed to have taken aback from modification however she can do that now. 6/8; patient returns to our clinic for reopening of the plantar right foot transmetatarsal amputation site. She has slight warmth and erythema to the area. She would like to try conservative wound care and not do the total contact cast that was she was doing before at this time 10/01/2020 upon evaluation today patient appears to be doing okay in regard to her foot ulcer. Fortunately there  does not appear to be any signs of active infection which is great news and overall very pleased with where things stand today. The wound does appear to be doing well and overall appearance wise is significantly improved compared to last week in my opinion based on my review of her pictures today. Overall the patient tells me that she is not having any significant pain which is great news as well. 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 This is a reoccurring wound for the patient. She has been following with Dr. Earleen Newport for this issue. She had a wound culture that grew Staph aureus sensitive to Bactrim and she is currently on this. She has been using Silvadene to  the wound bed. She has a cam walker boot and an offloading scooter that she uses. She currently denies signs of infection. 4/13; this is a patient that is a type II diabetic complicated by peripheral neuropathy. She has had a previous transmetatarsal amputation of the right foot. She has a new wound on the plantar right foot between the first and second met heads probably close to the lateral part of the first met head. Generally small clean wound however from 7-9 o'clock there is a probing area close to bone. She finished a course of trimethoprim/sulfamethoxazole yesterday for methicillin- resistant Staph aureus in the wound. I believe this was ordered by podiatry she has been using a Dakin's wet-to-dry dressing. She uses a cam boot and a knee scooter. She says she is religious with this 4/20; patient presents for follow-up. She continues to use Dakin's wet-to-dry dressings without issues. She states she would like to try the total contact cast that she has had this in the past. She currently denies signs of infection. 4/24; patient presents for follow-up. She would like the total contact cast placed today. She has been using Dakin's wet-to-dry dressings without issues. She denies signs of infection. 08-12-2021 patient presents today for obligatory first cast change. This was placed on Monday today is Wednesday. She seems to be doing quite well with using Hydrofera Blue and the overall appearance of the wound bed is excellent. We do need to replace the cast the good news is there is nothing that seems to be rubbing at this point which is awesome. 5/1; patient presents for follow-up. She has tolerated the total contact cast well. She has no issues or complaints today. 5/16; patient presents for follow-up. She states she missed her last week appointment due to having 2 appointments at the same time. She admits to knowing she is not supposed to have the contact cast for over 7 days. She currently denies  signs of infection. She has no issues or complaints today. 5/23; patient presents for follow-up. She has no issues or complaints pertaining to the total contact cast. We have been using Hydrofera Blue. She denies signs of infection. 5/30; patient presents for follow-up. She tolerated the total contact cast well over the past week. We have been using Hydrofera Blue under the cast. She has no issues or complaints today. 6/7; total contact cast Hydrofera Blue. Right plantar foot TMA site 6/15; patient presents for follow-up. She has had the total contact cast with Hydrofera Blue for the past week. She has no issues or complaints today. Patient History Information  obtained from Patient. Family History Cancer - Paternal Grandparents, Diabetes - Mother,Maternal Grandparents, Heart Disease - Mother,Paternal Grandparents,Maternal Grandparents,Father,Siblings, Hypertension - Mother,Father, Lung Disease - Mother, No family history of Hereditary Spherocytosis, Kidney Disease, Seizures, Stroke, Thyroid Problems, Tuberculosis. Social History Never smoker, Marital Status - Widowed, Alcohol Use - Never, Drug Use - Prior History, Caffeine Use - Daily - soda's. Medical History Eyes Denies history of Cataracts, Glaucoma, Optic Neuritis Ear/Nose/Mouth/Throat Denies history of Chronic sinus problems/congestion, Middle ear problems Hematologic/Lymphatic Denies history of Anemia, Hemophilia, Human Immunodeficiency Virus, Lymphedema, Sickle Cell Disease Respiratory Patient has history of Asthma, Sleep Apnea Denies history of Aspiration, Chronic Obstructive Pulmonary Disease (COPD), Pneumothorax, Tuberculosis Cardiovascular Patient has history of Hypertension Denies history of Angina, Arrhythmia, Congestive Heart Failure, Coronary Artery Disease, Deep Vein Thrombosis, Hypotension, Myocardial Infarction, Peripheral Arterial Disease, Peripheral Venous Disease, Phlebitis, Vasculitis Gastrointestinal Denies  history of Cirrhosis , Colitis, Crohnoos, Hepatitis A, Hepatitis B, Hepatitis C Endocrine Denies history of Type I Diabetes, Type II Diabetes Genitourinary Denies history of End Stage Renal Disease Immunological Denies history of Lupus Erythematosus, Raynaudoos, Scleroderma Integumentary (Skin) Denies history of History of Burn Musculoskeletal Patient has history of Osteoarthritis Denies history of Gout, Rheumatoid Arthritis, Osteomyelitis Neurologic Patient has history of Neuropathy, 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 - left 4th toe amp. - neck surgery. - spine surgery. - abdom. hysterectomy. - appendectomy. - lap. knee surgery. Medical A Surgical History Notes nd Constitutional Symptoms (General Health) torn right shoulder rotator cuff. Gastrointestinal Hx of GERD Psychiatric hx of bipolar 1 disorder, anxiety, depression. Objective Constitutional respirations regular, non-labored and within target range for patient.. Vitals Time Taken: 11:07 AM, Height: 64 in, Weight: 228 lbs, BMI: 39.1, Temperature: 98 F, Pulse: 71 bpm, Respiratory Rate: 16 breaths/min, Blood Pressure: 131/84 mmHg. Cardiovascular 2+ dorsalis pedis/posterior tibialis pulses. Psychiatric pleasant and cooperative. General Notes: Right foot: T the plantar aspect there is epithelization to the previous wound site however there is some protruding skin. Overall area appears o closed. Integumentary (Hair, Skin) Wound #2 status is Open. Original cause of wound was Gradually Appeared. The date acquired was: 07/12/2021. The wound has been in treatment 10 weeks. The wound is located on the [place]. The wound measures 0.1cm length x 0.1cm width x  0.1cm depth; 0.008cm^2 area and 0.001cm^3 volume. There is no tunneling or undermining noted. There is a medium amount of serosanguineous drainage noted. The wound margin is distinct with the outline attached to the wound base. There is large (67-100%) red, pink, hyper - granulation within the wound bed. There is no necrotic tissue within the wound bed. Assessment Active Problems ICD-10 Non-pressure chronic ulcer of other part of right foot with fat layer exposed Type 2 diabetes mellitus with foot ulcer Other idiopathic peripheral autonomic neuropathy Patient's wound appears healed. There is a slight bulging area to the previous wound site but nothing concerning or open at this time. I recommended keeping the area protected and following up in 1 week to assure that there are no further issues. Continue aggressive offloading. No need for the cast today. Plan Follow-up Appointments: Return Appointment in 1 week. - on 10/06/21 @ 8:00am w/ Dr. Heber [place] and Leveda Anna Room # 7 Bathing/ Shower/ Hygiene: May shower and wash wound with soap and water. - when changing dressing Edema Control - Lymphedema / SCD /  Other: Avoid standing for long periods of time. Off-Loading: Other: - Continue to use knee scooter WOUND #2: - Foot Wound Laterality: Plantar, Right Cleanser: Soap and Water Every Other Day/30 Days Discharge Instructions: May shower and wash wound with dial antibacterial soap and water prior to dressing change. Secondary Dressing: ALLEVYN Gentle Border, 3x3 (in/in) Every Other Day/30 Days Discharge Instructions: Apply over primary dressing as directed. 1. Foam border dressing 2. Aggressive offloadingooknee scooter 3. Follow-up in 1 week Electronic Signature(s) Signed: 10/01/2021 1:32:28 PM By: Kalman Shan DO Entered By: Kalman Shan on 10/01/2021 13:22:00 -------------------------------------------------------------------------------- HxROS Details Patient Name: Date of  Service: [name] Shutter D. 10/01/2021 11:00 A M Medical Record Number: [id number] Patient Account Number: [id number] Date of Birth/Sex: Treating RN: 1965-06-20 (56 y.o. [name] Primary Care Provider: Jenna Luo Other Clinician: Referring Provider: Treating Provider/Extender: Michel Bickers in Treatment: 10 Information Obtained From Patient Constitutional Symptoms (General Health) Medical History: Past Medical History Notes: torn right shoulder rotator cuff. Eyes Medical History: Negative for: Cataracts; Glaucoma; Optic Neuritis Ear/Nose/Mouth/Throat Medical History: Negative for: Chronic sinus problems/congestion; Middle ear problems Hematologic/Lymphatic Medical History: Negative for: Anemia; Hemophilia; Human Immunodeficiency Virus; Lymphedema; Sickle Cell Disease Respiratory Medical History: Positive for: Asthma; Sleep Apnea Negative for: Aspiration; Chronic Obstructive Pulmonary Disease (COPD); Pneumothorax; Tuberculosis Cardiovascular Medical History: Positive for: Hypertension Negative for: Angina; Arrhythmia; Congestive Heart Failure; Coronary Artery Disease; Deep Vein Thrombosis; Hypotension; Myocardial Infarction; Peripheral Arterial Disease; Peripheral Venous Disease; Phlebitis; Vasculitis Gastrointestinal Medical History: Negative for: Cirrhosis ; Colitis; Crohns; Hepatitis A; Hepatitis B; Hepatitis C Past Medical History Notes: Hx of GERD Endocrine Medical History: Negative for: Type I Diabetes; Type II Diabetes Genitourinary Medical History: Negative for: End Stage Renal Disease Immunological Medical History: Negative for: Lupus Erythematosus; Raynauds; Scleroderma Integumentary (Skin) Medical History: Negative for: History of Burn Musculoskeletal Medical History: Positive for: Osteoarthritis Negative for: Gout; Rheumatoid Arthritis; Osteomyelitis Neurologic Medical History: Positive for: Neuropathy; Seizure  Disorder - hx of seizures r/t tramadol Negative for: Dementia; Quadriplegia; Paraplegia Oncologic Medical History: Negative for: Received Chemotherapy; Received Radiation Psychiatric Medical History: Negative for: Anorexia/bulimia; Confinement Anxiety Past Medical History Notes: hx of bipolar 1 disorder, anxiety, depression. Immunizations Pneumococcal Vaccine: Received Pneumococcal Vaccination: Yes Received Pneumococcal Vaccination On or After 60th Birthday: No Immunization Notes: pt. doesn't remember last tetanus shot Implantable Devices None Hospitalization / Surgery History Type of Hospitalization/Surgery trasmet amp right foot left 4th toe amp neck surgery spine surgery abdom. hysterectomy appendectomy lap. knee surgery Family and Social History Cancer: Yes - Paternal Grandparents; Diabetes: Yes - Mother,Maternal Grandparents; Heart Disease: Yes - Mother,Paternal Grandparents,Maternal Grandparents,Father,Siblings; Hereditary Spherocytosis: No; Hypertension: Yes - Mother,Father; Kidney Disease: No; Lung Disease: Yes - Mother; Seizures: No; Stroke: No; Thyroid Problems: No; Tuberculosis: No; Never smoker; Marital Status - Widowed; Alcohol Use: Never; Drug Use: Prior History; Caffeine Use: Daily - soda's; Financial Concerns: No; Food, Clothing or Shelter Needs: No; Support System Lacking: No; Transportation Concerns: No Electronic Signature(s) Signed: 10/01/2021 1:32:28 PM By: Kalman Shan DO Signed: 10/01/2021 5:26:14 PM By: Lorrin Jackson Entered By: Kalman Shan on 10/01/2021 13:17:50 -------------------------------------------------------------------------------- SuperBill Details Patient Name: Date of Service: [name] Shutter D. 10/01/2021 Medical Record Number: [id number] Patient Account Number: [id number] Date of Birth/Sex: Treating RN: 1966-04-15 (56 y.o. [name] Primary Care Provider: Jenna Luo Other Clinician: Referring Provider: Treating  Provider/Extender: Michel Bickers in Treatment: 10 Diagnosis Coding ICD-10 Codes Code Description [phone] Non-pressure chronic ulcer of other part of right foot with fat layer exposed E11.621  Type 2 diabetes mellitus with foot ulcer G90.09 Other idiopathic peripheral autonomic neuropathy Facility Procedures CPT4 Code: 08657846 Description: [phone] - WOUND CARE VISIT-LEV 2 EST PT Modifier: Quantity: 1 Physician Procedures : CPT4 Code Description Modifier [id number] - WC PHYS LEVEL 3 - EST PT ICD-10 Diagnosis Description L97.512 Non-pressure chronic ulcer of other part of right foot with fat layer exposed E11.621 Type 2 diabetes mellitus with foot ulcer G90.09 Other  idiopathic peripheral autonomic neuropathy Quantity: 1 Electronic Signature(s) Signed: 10/01/2021 1:32:28 PM By: Kalman Shan DO Entered By: Kalman Shan on 10/01/2021 13:22:12

## 2021-10-01 NOTE — Progress Notes (Signed)
[name] [name], [name] [name] ([id number]) Visit Report for 10/01/2021 Arrival Information Details Patient Name: Date of Service: [name] [name], [name] [name] 10/01/2021 11:00 A M Medical Record Number: [id number] Patient Account Number: [id number] Date of Birth/Sex: Treating RN: 07/20/1965 (56 y.o. [name] Primary Care Davanna He: Jenna Luo Other Clinician: Referring Maejor Erven: Treating Jenniffer Vessels/Extender: Michel Bickers in Treatment: 10 Visit Information History Since Last Visit Added or deleted any medications: No Patient Arrived: [name] [name] Any new allergies or adverse reactions: No Arrival Time: 11:06 Had a fall or experienced change in No Transfer Assistance: None activities of daily living that may affect Patient Identification Verified: Yes risk of falls: Secondary Verification Process Completed: Yes Signs or symptoms of abuse/neglect since last visito No Patient Requires Transmission-Based Precautions: No Hospitalized since last visit: No Patient Has Alerts: No Implantable device outside of the clinic excluding No cellular tissue based products placed in the center since last visit: Has Dressing in Place as Prescribed: Yes Has Footwear/Offloading in Place as Prescribed: Yes Right: T Contact Cast otal Pain Present Now: No Electronic Signature(s) Signed: 10/01/2021 5:26:14 PM By: Lorrin Jackson Entered By: Lorrin Jackson on 10/01/2021 11:06:58 -------------------------------------------------------------------------------- Clinic Level of Care Assessment Details Patient Name: Date of Service: [name] [name] 10/01/2021 11:00 A M Medical Record Number: [id number] Patient Account Number: [id number] Date of Birth/Sex: Treating RN: Mar 03, 1966 (56 y.o. [name] Primary Care Ariam Mol: Jenna Luo Other Clinician: Referring Tasharra Nodine: Treating Bookert Guzzi/Extender: Michel Bickers in Treatment: 10 Clinic Level of Care Assessment  Items TOOL 4 Quantity Score X- 1 0 Use when only an EandM is performed on FOLLOW-UP visit ASSESSMENTS - Nursing Assessment / Reassessment X- 1 10 Reassessment of Co-morbidities (includes updates in patient status) X- 1 5 Reassessment of Adherence to Treatment Plan ASSESSMENTS - Wound and Skin A ssessment / Reassessment X - Simple Wound Assessment / Reassessment - one wound 1 5 []  - 0 Complex Wound Assessment / Reassessment - multiple wounds []  - 0 Dermatologic / Skin Assessment (not related to wound area) ASSESSMENTS - Focused Assessment []  - 0 Circumferential Edema Measurements - multi extremities []  - 0 Nutritional Assessment / Counseling / Intervention []  - 0 Lower Extremity Assessment (monofilament, tuning fork, pulses) []  - 0 Peripheral Arterial Disease Assessment (using hand held doppler) ASSESSMENTS - Ostomy and/or Continence Assessment and Care []  - 0 Incontinence Assessment and Management []  - 0 Ostomy Care Assessment and Management (repouching, etc.) PROCESS - Coordination of Care []  - 0 Simple Patient / Family Education for ongoing care X- 1 20 Complex (extensive) Patient / Family Education for ongoing care []  - 0 Staff obtains Programmer, systems, Records, T Results / Process Orders est []  - 0 Staff telephones HHA, Nursing Homes / Clarify orders / etc []  - 0 Routine Transfer to another Facility (non-emergent condition) []  - 0 Routine Hospital Admission (non-emergent condition) []  - 0 New Admissions / Biomedical engineer / Ordering NPWT Apligraf, etc. , []  - 0 Emergency Hospital Admission (emergent condition) []  - 0 Simple Discharge Coordination []  - 0 Complex (extensive) Discharge Coordination PROCESS - Special Needs []  - 0 Pediatric / Minor Patient Management []  - 0 Isolation Patient Management []  - 0 Hearing / Language / Visual special needs []  - 0 Assessment of Community assistance (transportation, D/C planning, etc.) []  - 0 Additional assistance  / Altered mentation []  - 0 Support Surface(s) Assessment (bed, cushion, seat, etc.) INTERVENTIONS - Wound Cleansing / Measurement X - Simple Wound Cleansing - one wound 1 5 []  - 0  Complex Wound Cleansing - multiple wounds X- 1 5 Wound Imaging (photographs - any number of wounds) $RemoveBe'[]'tvxOvcuJW$  - 0 Wound Tracing (instead of photographs) X- 1 5 Simple Wound Measurement - one wound $RemoveB'[]'lAzLTGVn$  - 0 Complex Wound Measurement - multiple wounds INTERVENTIONS - Wound Dressings X - Small Wound Dressing one or multiple wounds 1 10 $Re'[]'ykN$  - 0 Medium Wound Dressing one or multiple wounds $RemoveBeforeD'[]'IdYrBdFahFTynk$  - 0 Large Wound Dressing one or multiple wounds $RemoveBeforeD'[]'DmWUjbFHTngPyn$  - 0 Application of Medications - topical $RemoveB'[]'TbqHZOBi$  - 0 Application of Medications - injection INTERVENTIONS - Miscellaneous $RemoveBeforeD'[]'yIepWBTVqfiYDr$  - 0 External ear exam $Remove'[]'iiJgAAr$  - 0 Specimen Collection (cultures, biopsies, blood, body fluids, etc.) $RemoveBefor'[]'qRsHFMlaJElP$  - 0 Specimen(s) / Culture(s) sent or taken to Lab for analysis $RemoveBefo'[]'qhpAJQTakIE$  - 0 Patient Transfer (multiple staff / Civil Service fast streamer / Similar devices) $RemoveBeforeDE'[]'QXyuXkZUTiVRDyc$  - 0 Simple Staple / Suture removal (25 or less) $Remove'[]'daZxqKQ$  - 0 Complex Staple / Suture removal (26 or more) $Remove'[]'lIOrObU$  - 0 Hypo / Hyperglycemic Management (close monitor of Blood Glucose) $RemoveBefore'[]'nXrbaiVeNRNHV$  - 0 Ankle / Brachial Index (ABI) - do not check if billed separately X- 1 5 Vital Signs Has the patient been seen at the hospital within the last three years: Yes Total Score: 70 Level Of Care: New/Established - Level 2 Electronic Signature(s) Signed: 10/01/2021 5:26:14 PM By: Lorrin Jackson Entered By: Lorrin Jackson on 10/01/2021 11:34:05 -------------------------------------------------------------------------------- Encounter Discharge Information Details Patient Name: Date of Service: [name] Shutter D. 10/01/2021 11:00 A M Medical Record Number: [id number] Patient Account Number: [id number] Date of Birth/Sex: Treating RN: 06/30/1965 (56 y.o. [name] Primary Care Santresa Levett: Jenna Luo Other Clinician: Referring  Johnette Teigen: Treating Kenzli Barritt/Extender: Michel Bickers in Treatment: 10 Encounter Discharge Information Items Discharge Condition: Stable Ambulatory Status: [place] Discharge Destination: Home Transportation: Private Auto Schedule Follow-up Appointment: Yes Clinical Summary of Care: Provided on 10/01/2021 Form Type Recipient Paper Patient Patient Electronic Signature(s) Signed: 10/01/2021 5:26:14 PM By: Lorrin Jackson Entered By: Lorrin Jackson on 10/01/2021 11:34:49 -------------------------------------------------------------------------------- Lower Extremity Assessment Details Patient Name: Date of Service: [name] Shutter D. 10/01/2021 11:00 A M Medical Record Number: [id number] Patient Account Number: [id number] Date of Birth/Sex: Treating RN: [birth year] (56 y.o. [name] Primary Care Bassem Bernasconi: Jenna Luo Other Clinician: Referring Randee Huston: Treating Seidy Labreck/Extender: Michel Bickers in Treatment: 10 Edema Assessment Assessed: Shirlyn Goltz: No] Patrice Paradise: Yes] Edema: [Left: N] [Right: o] Calf Left: Right: Point of Measurement: 26 cm From Medial Instep 38.9 cm Ankle Left: Right: Point of Measurement: 10 cm From Medial Instep 24.3 cm Vascular Assessment Pulses: Dorsalis Pedis Palpable: [Right:Yes] Electronic Signature(s) Signed: 10/01/2021 5:26:14 PM By: Lorrin Jackson Entered By: Lorrin Jackson on 10/01/2021 11:16:57 -------------------------------------------------------------------------------- Multi Wound Chart Details Patient Name: Date of Service: [name] Shutter D. 10/01/2021 11:00 A M Medical Record Number: [id number] Patient Account Number: [id number] Date of Birth/Sex: Treating RN: [birth year] (56 y.o. [name] Primary Care Tarah Buboltz: Jenna Luo Other Clinician: Referring Joushua Dugar: Treating Alleigh Mollica/Extender: Michel Bickers in Treatment: 10 Vital Signs Height(in):  33 Pulse(bpm): 83 Weight(lbs): 228 Blood Pressure(mmHg): 131/84 Body Mass Index(BMI): 39.1 Temperature(F): 98 Respiratory Rate(breaths/min): 16 Photos: [N/A:N/A] Right, Plantar Foot N/A N/A Wound Location: Gradually Appeared N/A N/A Wounding Event: Neuropathic Ulcer-Non Diabetic N/A N/A Primary Etiology: Asthma, Sleep Apnea, Hypertension, N/A N/A Comorbid History: Osteoarthritis, Neuropathy, Seizure Disorder 07/12/2021 N/A N/A Date Acquired: 10 N/A N/A Weeks of Treatment: Open N/A N/A Wound Status: No N/A N/A Wound Recurrence: 0.1x0.1x0.1 N/A N/A Measurements L x W x D (cm) 0.008 N/A N/A A (  cm) : rea 0.001 N/A N/A Volume (cm) : 99.20% N/A N/A % Reduction in Area: 99.90% N/A N/A % Reduction in Volume: Full Thickness Without Exposed N/A N/A Classification: Support Structures Medium N/A N/A Exudate Amount: Serosanguineous N/A N/A Exudate Type: red, brown N/A N/A Exudate Color: Distinct, outline attached N/A N/A Wound Margin: Large (67-100%) N/A N/A Granulation Amount: Red, Pink, Hyper-granulation N/A N/A Granulation Quality: None Present (0%) N/A N/A Necrotic Amount: Fascia: No N/A N/A Exposed Structures: Fat Layer (Subcutaneous Tissue): No Tendon: No Muscle: No Joint: No Bone: No Treatment Notes Wound #2 (Foot) Wound Laterality: Plantar, Right Cleanser Soap and Water Discharge Instruction: May shower and wash wound with dial antibacterial soap and water prior to dressing change. Peri-Wound Care Topical Primary Dressing Secondary Dressing ALLEVYN Gentle Border, 3x3 (in/in) Discharge Instruction: Apply over primary dressing as directed. Secured With Compression Wrap Compression Stockings Add-Ons Electronic Signature(s) Signed: 10/01/2021 1:32:28 PM By: Kalman Shan DO Signed: 10/01/2021 5:26:14 PM By: Lorrin Jackson Entered By: Kalman Shan on 10/01/2021  13:17:15 -------------------------------------------------------------------------------- Multi-Disciplinary Care Plan Details Patient Name: Date of Service: [name] Shutter D. 10/01/2021 11:00 A M Medical Record Number: [id number] Patient Account Number: [id number] Date of Birth/Sex: Treating RN: 1966/04/19 (56 y.o. [name] Primary Care Miela Desjardin: Jenna Luo Other Clinician: Referring Neela Zecca: Treating Babita Amaker/Extender: Michel Bickers in Treatment: 10 Active Inactive Wound/Skin Impairment Nursing Diagnoses: Impaired tissue integrity Goals: Patient/caregiver will verbalize understanding of skin care regimen Date Initiated: 07/23/2021 Target Resolution Date: 10/05/2021 Goal Status: Active Ulcer/skin breakdown will have a volume reduction of 30% by week 4 Date Initiated: 07/23/2021 Date Inactivated: 09/08/2021 Target Resolution Date: 08/20/2021 Goal Status: Met Ulcer/skin breakdown will have a volume reduction of 50% by week 8 Date Initiated: 09/08/2021 Target Resolution Date: 10/05/2021 Goal Status: Active Interventions: Assess patient/caregiver ability to obtain necessary supplies Assess patient/caregiver ability to perform ulcer/skin care regimen upon admission and as needed Assess ulceration(s) every visit Provide education on ulcer and skin care Treatment Activities: Topical wound management initiated : 07/23/2021 Notes: 09/08/21: Wound greater than 30% volume reduction. Wound care regimen continues. Electronic Signature(s) Signed: 10/01/2021 5:26:14 PM By: Lorrin Jackson Entered By: Lorrin Jackson on 10/01/2021 11:30:42 -------------------------------------------------------------------------------- Pain Assessment Details Patient Name: Date of Service: VERMELLE, CAMMARATA 10/01/2021 11:00 A M Medical Record Number: [id number] Patient Account Number: [id number] Date of Birth/Sex: Treating RN: [birth year] (56 y.o. [name] Primary  Care Frances Joynt: Jenna Luo Other Clinician: Referring Ivan Lacher: Treating Cambryn Charters/Extender: Michel Bickers in Treatment: 10 Active Problems Location of Pain Severity and Description of Pain Patient Has Paino No Site Locations Pain Management and Medication Current Pain Management: Electronic Signature(s) Signed: 10/01/2021 5:26:14 PM By: Lorrin Jackson Entered By: Lorrin Jackson on 10/01/2021 11:07:38 -------------------------------------------------------------------------------- Patient/Caregiver Education Details Patient Name: Date of Service: [name] [name], [name] D. 6/15/2023andnbsp11:00 [place] Record Number: [id number] Patient Account Number: [id number] Date of Birth/Gender: Treating RN: [birth year] (56 y.o. [name] Primary Care Physician: Jenna Luo Other Clinician: Referring Physician: Treating Physician/Extender: Michel Bickers in Treatment: 10 Education Assessment Education Provided To: Patient Education Topics Provided Wound/Skin Impairment: Methods: Demonstration, Explain/Verbal, Printed Responses: State content correctly [employer]) Signed: 10/01/2021 5:26:14 PM By: Lorrin Jackson Entered By: Lorrin Jackson on 10/01/2021 11:30:59 -------------------------------------------------------------------------------- Wound Assessment Details Patient Name: Date of Service: [name] Shutter D. 10/01/2021 11:00 A M Medical Record Number: [id number] Patient Account Number: [id number] Date of Birth/Sex: Treating RN: Sep 17, 1965 (56 y.o. [name] Primary Care Caera Enwright: Jenna Luo Other Clinician: Referring Jassica Zazueta:  Treating Nataley Bahri/Extender: Roetta Sessions Weeks in Treatment: 10 Wound Status Wound Number: 2 Primary Neuropathic Ulcer-Non Diabetic Etiology: Wound Location: Right, Plantar Foot Wound Open Wounding Event: Gradually Appeared Status: Date  Acquired: 07/12/2021 Comorbid Asthma, Sleep Apnea, Hypertension, Osteoarthritis, Weeks Of Treatment: 10 History: Neuropathy, Seizure Disorder Clustered Wound: No Photos Wound Measurements Length: (cm) 0.1 Width: (cm) 0.1 Depth: (cm) 0.1 Area: (cm) 0.008 Volume: (cm) 0.001 % Reduction in Area: 99.2% % Reduction in Volume: 99.9% Tunneling: No Undermining: No Wound Description Classification: Full Thickness Without Exposed Support Structures Wound Margin: Distinct, outline attached Exudate Amount: Medium Exudate Type: Serosanguineous Exudate Color: red, brown Foul Odor After Cleansing: No Slough/Fibrino No Wound Bed Granulation Amount: Large (67-100%) Exposed Structure Granulation Quality: Red, Pink, Hyper-granulation Fascia Exposed: No Necrotic Amount: None Present (0%) Fat Layer (Subcutaneous Tissue) Exposed: No Tendon Exposed: No Muscle Exposed: No Joint Exposed: No Bone Exposed: No Treatment Notes Wound #2 (Foot) Wound Laterality: Plantar, Right Cleanser Soap and Water Discharge Instruction: May shower and wash wound with dial antibacterial soap and water prior to dressing change. Peri-Wound Care Topical Primary Dressing Secondary Dressing ALLEVYN Gentle Border, 3x3 (in/in) Discharge Instruction: Apply over primary dressing as directed. Secured With Compression Wrap Compression Stockings Environmental education officer) Signed: 10/01/2021 5:26:14 PM By: Lorrin Jackson Entered By: Lorrin Jackson on 10/01/2021 11:26:35 -------------------------------------------------------------------------------- Vitals Details Patient Name: Date of Service: [name] Shutter D. 10/01/2021 11:00 A M Medical Record Number: [id number] Patient Account Number: [id number] Date of Birth/Sex: Treating RN: 09/24/1965 (56 y.o. [name] Primary Care Dalyla Chui: Jenna Luo Other Clinician: Referring Jeanie Mccard: Treating Deserea Bordley/Extender: Michel Bickers in Treatment: 10 Vital Signs Time Taken: 11:07 Temperature (F): 98 Height (in): 64 Pulse (bpm): 71 Weight (lbs): 228 Respiratory Rate (breaths/min): 16 Body Mass Index (BMI): 39.1 Blood Pressure (mmHg): 131/84 Reference Range: 80 - 120 mg / dl Electronic Signature(s) Signed: 10/01/2021 5:26:14 PM By: Lorrin Jackson Entered By: Lorrin Jackson on 10/01/2021 11:07:33

## 2021-10-05 ENCOUNTER — Ambulatory Visit: Payer: Medicare Other

## 2021-10-05 DIAGNOSIS — L97512 Non-pressure chronic ulcer of other part of right foot with fat layer exposed: Secondary | ICD-10-CM

## 2021-10-05 NOTE — Progress Notes (Signed)
SITUATION Reason for Consult: Follow-up with existing equipment Patient / Caregiver Report: Patient is uninterested in new equipment at this time as everything is not covered  OBJECTIVE DATA History / Diagnosis:    ICD-10-CM   1. 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 Added shoe stiffening plate to prevent flexing of toe filler into amputation site. Instructed patient in runner's lock lacing technique to prevent foot from migrating in shoe. Device(s) intact and fit is excellent. All questions answered and concerns addressed.  PLAN Follow-up as needed (PRN). Plan of care discussed with and agreed upon by patient / caregiver.

## 2021-10-06 ENCOUNTER — Encounter (HOSPITAL_BASED_OUTPATIENT_CLINIC_OR_DEPARTMENT_OTHER): Payer: Medicare Other | Admitting: Internal Medicine

## 2021-10-06 DIAGNOSIS — L97512 Non-pressure chronic ulcer of other part of right foot with fat layer exposed: Secondary | ICD-10-CM

## 2021-10-06 DIAGNOSIS — G4730 Sleep apnea, unspecified: Secondary | ICD-10-CM | POA: Diagnosis not present

## 2021-10-06 DIAGNOSIS — G9009 Other idiopathic peripheral autonomic neuropathy: Secondary | ICD-10-CM | POA: Diagnosis not present

## 2021-10-06 DIAGNOSIS — Z89422 Acquired absence of other left toe(s): Secondary | ICD-10-CM | POA: Diagnosis not present

## 2021-10-06 DIAGNOSIS — E1142 Type 2 diabetes mellitus with diabetic polyneuropathy: Secondary | ICD-10-CM | POA: Diagnosis not present

## 2021-10-06 DIAGNOSIS — J45909 Unspecified asthma, uncomplicated: Secondary | ICD-10-CM | POA: Diagnosis not present

## 2021-10-06 DIAGNOSIS — M1990 Unspecified osteoarthritis, unspecified site: Secondary | ICD-10-CM | POA: Diagnosis not present

## 2021-10-06 DIAGNOSIS — E11621 Type 2 diabetes mellitus with foot ulcer: Secondary | ICD-10-CM | POA: Diagnosis not present

## 2021-10-06 DIAGNOSIS — Z89431 Acquired absence of right foot: Secondary | ICD-10-CM | POA: Diagnosis not present

## 2021-10-06 NOTE — Progress Notes (Signed)
[name] [name], [name] [name] ([id number]) Visit Report for 10/06/2021 Arrival Information Details Patient Name: Date of Service: [name] [name], [name] [name] 10/06/2021 8:00 A M Medical Record Number: [id number] Patient Account Number: [id number] Date of Birth/Sex: Treating RN: [birth year] (56 y.o. [name] Primary Care Kiyo Heal: Jenna Luo Other Clinician: Referring Ronold Hardgrove: Treating Allix Blomquist/Extender: Michel Bickers in Treatment: 10 Visit Information History Since Last Visit Added or deleted any medications: No Patient Arrived: [name] [name] Any new allergies or adverse reactions: No Arrival Time: 08:10 Had a fall or experienced change in No Transfer Assistance: None activities of daily living that may affect Patient Identification Verified: Yes risk of falls: Secondary Verification Process Completed: Yes Signs or symptoms of abuse/neglect since last visito No Patient Requires Transmission-Based Precautions: No Hospitalized since last visit: No Patient Has Alerts: No Implantable device outside of the clinic excluding No cellular tissue based products placed in the center since last visit: Has Dressing in Place as Prescribed: Yes Pain Present Now: No Electronic Signature(s) Signed: 10/06/2021 6:10:01 PM By: Lorrin Jackson Entered By: Lorrin Jackson on 10/06/2021 08:12:12 -------------------------------------------------------------------------------- Encounter Discharge Information Details Patient Name: Date of Service: [name] Shutter D. 10/06/2021 8:00 A M Medical Record Number: [id number] Patient Account Number: [id number] Date of Birth/Sex: Treating RN: 05-07-1965 (56 y.o. [name] Primary Care Cathlene Gardella: Jenna Luo Other Clinician: Referring Tywanda Rice: Treating Nigel Ericsson/Extender: Michel Bickers in Treatment: 10 Encounter Discharge Information Items Discharge Condition: Stable Ambulatory Status: [place] Discharge Destination:  Home Transportation: Private Auto Schedule Follow-up Appointment: Yes Clinical Summary of Care: Provided on 10/06/2021 Form Type Recipient Paper Patient Patient Electronic Signature(s) Signed: 10/06/2021 6:10:01 PM By: Lorrin Jackson Entered By: Lorrin Jackson on 10/06/2021 08:38:13 -------------------------------------------------------------------------------- Lower Extremity Assessment Details Patient Name: Date of Service: [name] Shutter D. 10/06/2021 8:00 A M Medical Record Number: [id number] Patient Account Number: [id number] Date of Birth/Sex: Treating RN: December 08, 1965 (56 y.o. [name] Primary Care Riely Oetken: Jenna Luo Other Clinician: Referring Minal Stuller: Treating Lannette Avellino/Extender: Michel Bickers in Treatment: 10 Edema Assessment Assessed: Shirlyn Goltz: No] Patrice Paradise: Yes] Edema: [Left: N] [Right: o] Calf Left: Right: Point of Measurement: 26 cm From Medial Instep 38.9 cm Ankle Left: Right: Point of Measurement: 10 cm From Medial Instep 24.3 cm Vascular Assessment Pulses: Dorsalis Pedis Palpable: [Right:Yes] Electronic Signature(s) Signed: 10/06/2021 6:10:01 PM By: Lorrin Jackson Entered By: Lorrin Jackson on 10/06/2021 08:14:24 -------------------------------------------------------------------------------- Multi Wound Chart Details Patient Name: Date of Service: [name] Shutter D. 10/06/2021 8:00 A M Medical Record Number: [id number] Patient Account Number: [id number] Date of Birth/Sex: Treating RN: 1965/06/09 (56 y.o. 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] [N/A:N/A] Primary Etiology: [2:Asthma, Sleep Apnea, Hypertension, N/A] Comorbid History: [2:Osteoarthritis, Neuropathy, Seizure Disorder 07/12/2021] [N/A:N/A] Date Acquired: [2:10] [N/A:N/A] Weeks of Treatment: [2:Open] [N/A:N/A] Wound Status: [2:No] [N/A:N/A] Wound Recurrence: [2:0.4x0.4x0.1] [N/A:N/A] Measurements L x W x D (cm) [2:0.126] [N/A:N/A] A (cm) : rea [2:0.013] [N/A:N/A] Volume (cm) : [2:86.60%] [N/A:N/A] % Reduction in Area: [2:98.90%] [N/A:N/A] % Reduction in Volume: [2:Full Thickness Without Exposed] [N/A:N/A] Classification: [2:Support Structures Medium] [N/A:N/A] Exudate Amount: [2:Serosanguineous] [N/A:N/A] Exudate Type: [2:red, brown] [N/A:N/A] Exudate Color: [2:Distinct, outline attached] [N/A:N/A] Wound Margin: [2:Large (67-100%)] [N/A:N/A] Granulation Amount: [2:Red, Hyper-granulation] [N/A:N/A] Granulation Quality: [2:None Present (  0%)] [N/A:N/A] Necrotic Amount: [2:Fat Layer (Subcutaneous Tissue): Yes N/A] Exposed Structures: [2:Fascia: No Tendon: No Muscle: No Joint: No Bone: No Large (67-100%)] [N/A:N/A] Epithelialization: [2:Chemical Cauterization] [N/A:N/A] Procedures Performed: [2:T Contact Cast otal] Treatment Notes Wound #2 (Foot) Wound Laterality: Plantar, Right Cleanser Soap and Water Discharge Instruction: May shower and wash wound with dial antibacterial soap and water prior to dressing change. Peri-Wound Care Topical Primary Dressing Hydrofera Blue Ready Foam, 2.5 x2.5 in Discharge Instruction: Apply to wound bed as instructed Secondary Dressing ALLEVYN Gentle Border, 4x4 (in/in) Discharge Instruction: Apply over primary dressing as directed. Secured With Compression Wrap Compression Stockings Add-Ons Electronic Signature(s) Signed: 10/06/2021 9:56:14 AM By: Kalman Shan DO Signed: 10/06/2021 6:10:01 PM By: Fara Chute By: Kalman Shan on 10/06/2021 08:43:59 -------------------------------------------------------------------------------- Multi-Disciplinary Care Plan Details Patient Name: Date of Service: [name] Shutter D. 10/06/2021 8:00 A M Medical Record Number: [id number] Patient Account Number: [id number] Date of Birth/Sex: Treating RN: 1965-12-23 (56 y.o. [name] Primary Care Nuno Brubacher: Other Clinician: Jenna Luo Referring Elissa Grieshop: Treating Daltin Crist/Extender: Michel Bickers in Treatment: 10 Active Inactive Wound/Skin Impairment Nursing Diagnoses: Impaired tissue integrity Goals: Patient/caregiver will verbalize understanding of skin care regimen Date Initiated: 07/23/2021 Target Resolution Date: 11/03/2021 Goal Status: Active Ulcer/skin breakdown will have a volume reduction of 30% by week 4 Date Initiated: 07/23/2021 Date Inactivated: 09/08/2021 Target Resolution Date: 08/20/2021 Goal Status: Met Ulcer/skin breakdown will have a volume reduction of 50% by week 8 Date Initiated: 09/08/2021 Date Inactivated: 10/06/2021 Target Resolution Date: 10/05/2021 Goal Status: Met Interventions: Assess patient/caregiver ability to obtain necessary supplies Assess patient/caregiver ability to perform ulcer/skin care regimen upon admission and as needed Assess ulceration(s) every visit Provide education on ulcer and skin care Treatment Activities: Topical wound management initiated : 07/23/2021 Notes: 09/08/21: Wound greater than 30% volume reduction. Wound care regimen continues. Electronic Signature(s) Signed: 10/06/2021 6:10:01 PM By: Lorrin Jackson Entered By: Lorrin Jackson on 10/06/2021 08:30:11 -------------------------------------------------------------------------------- Pain Assessment Details Patient Name: Date of Service: [name] Shutter D. 10/06/2021 8:00 A M Medical Record Number: [id number] Patient Account Number: [id number] Date of Birth/Sex: Treating  RN: 07-11-1965 (56 y.o. [name] Primary Care Broxton Broady: Jenna Luo Other Clinician: Referring Jeff Mccallum: Treating Antoni Stefan/Extender: Michel Bickers in Treatment: 10 Active Problems Location of Pain Severity and Description of Pain Patient Has Paino No Site Locations Pain Management and Medication Current Pain Management: Electronic Signature(s) Signed: 10/06/2021 6:10:01 PM By: Lorrin Jackson Entered By: Lorrin Jackson on 10/06/2021 08:12:23 -------------------------------------------------------------------------------- Patient/Caregiver Education Details Patient Name: Date of Service: [name] [name], [name] D. 6/20/2023andnbsp8:00 [place] Record Number: [id number] Patient Account Number: [id number] Date of Birth/Gender: Treating RN: 1965/09/22 (56 y.o. [name] Primary Care Physician: Jenna Luo Other Clinician: Referring Physician: Treating Physician/Extender: Michel Bickers in Treatment: 10 Education Assessment Education Provided To: Patient Education Topics Provided Offloading: Methods: Explain/Verbal, Printed Responses: State content correctly Wound/Skin Impairment: Methods: Explain/Verbal, Printed Responses: State content correctly Electronic Signature(s) Signed: 10/06/2021 6:10:01 PM By: Lorrin Jackson Entered By: Lorrin Jackson on 10/06/2021 08:30:29 -------------------------------------------------------------------------------- Wound Assessment Details Patient Name: Date of Service: [name] Shutter D. 10/06/2021 8:00 A M Medical Record Number: [id number] Patient Account Number: [id number] Date of Birth/Sex: Treating RN: 1965-11-07 (56 y.o. [name] Primary Care Ariza Evans: Jenna Luo Other Clinician: Referring Chavon Lucarelli: Treating Chisum Habenicht/Extender: Michel Bickers in Treatment: 10 Wound Status Wound Number: 2 Primary Neuropathic Ulcer-Non  Diabetic Etiology: Wound Location: Right, Plantar Foot Wound Open Wounding Event: Gradually Appeared Status: Date  Acquired: 07/12/2021 Comorbid Asthma, Sleep Apnea, Hypertension, Osteoarthritis, Weeks Of Treatment: 10 History: Neuropathy, Seizure Disorder Clustered Wound: No Photos Wound Measurements Length: (cm) 0.4 Width: (cm) 0.4 Depth: (cm) 0.1 Area: (cm) 0.126 Volume: (cm) 0.013 % Reduction in Area: 86.6% % Reduction in Volume: 98.9% Epithelialization: Large (67-100%) Tunneling: No Undermining: No Wound Description Classification: Full Thickness Without Exposed Support Structures Wound Margin: Distinct, outline attached Exudate Amount: Medium Exudate Type: Serosanguineous Exudate Color: red, brown Foul Odor After Cleansing: No Slough/Fibrino No Wound Bed Granulation Amount: Large (67-100%) Exposed Structure Granulation Quality: Red, Hyper-granulation Fascia Exposed: No Necrotic Amount: None Present (0%) Fat Layer (Subcutaneous Tissue) Exposed: Yes Tendon Exposed: No Muscle Exposed: No Joint Exposed: No Bone Exposed: No Treatment Notes Wound #2 (Foot) Wound Laterality: Plantar, Right Cleanser Soap and Water Discharge Instruction: May shower and wash wound with dial antibacterial soap and water prior to dressing change. Peri-Wound Care Topical Primary Dressing Hydrofera Blue Ready Foam, 2.5 x2.5 in Discharge Instruction: Apply to wound bed as instructed Secondary Dressing ALLEVYN Gentle Border, 4x4 (in/in) Discharge Instruction: Apply over primary dressing as directed. Secured With Compression Wrap Compression Stockings Environmental education officer) Signed: 10/06/2021 6:10:01 PM By: Lorrin Jackson Entered By: Lorrin Jackson on 10/06/2021 08:26:25 -------------------------------------------------------------------------------- Vitals Details Patient Name: Date of Service: [name] Shutter D. 10/06/2021 8:00 A M Medical Record Number:  [id number] Patient Account Number: [id number] Date of Birth/Sex: Treating RN: 1965/04/22 (56 y.o. [name] Primary Care Hashem Goynes: Jenna Luo Other Clinician: Referring Renea Schoonmaker: Treating Quentina Fronek/Extender: Michel Bickers in Treatment: 10 Vital Signs Time Taken: 08:12 Temperature (F): 98.0 Height (in): 64 Pulse (bpm): 87 Weight (lbs): 228 Respiratory Rate (breaths/min): 16 Body Mass Index (BMI): 39.1 Blood Pressure (mmHg): 126/82 Reference Range: 80 - 120 mg / dl Electronic Signature(s) Signed: 10/06/2021 6:10:01 PM By: Lorrin Jackson Entered By: Lorrin Jackson on 10/06/2021 08:14:13

## 2021-10-06 NOTE — Progress Notes (Signed)
[name] [name], [name] [name] ([id number]) Visit Report for 10/06/2021 Chief Complaint Document Details Patient Name: Date of Service: [name] [name], [name] [name] 10/06/2021 8:00 A M Medical Record Number: [id number] Patient Account Number: [id number] Date of Birth/Sex: Treating RN: 1965-06-28 (56 y.o. [name] Primary Care Provider: Jenna Luo Other Clinician: Referring Provider: Treating Provider/Extender: Michel Bickers in Treatment: 10 Information Obtained from: Patient Chief Complaint 06/03/2019; patient is here for review of a wound on her plantar foot transmetatarsal amputation site at roughly the third met head 07/23/2021; patient presents for right plantar foot wound Electronic Signature(s) Signed: 10/06/2021 9:56:14 AM By: Kalman Shan DO Entered By: Kalman Shan on 10/06/2021 08:45:08 -------------------------------------------------------------------------------- HPI Details Patient Name: Date of Service: [name] Shutter D. 10/06/2021 8:00 A M Medical Record Number: [id number] Patient Account Number: [id number] Date of Birth/Sex: Treating RN: 05/31/1965 (56 y.o. [name] Primary Care Provider: Jenna Luo Other Clinician: Referring Provider: Treating Provider/Extender: Michel Bickers in Treatment: 10 History of Present Illness HPI Description: ADMISSION 06/02/2020 This is a 56 year old woman who has had problems with wounds on her predominantly plantar right foot for quite a period of time. 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 More recently she has had an area developed in the right mid foot at roughly the third metatarsal head. She says this started as a blister of November. Its not been closing. More recently she has been using Silvadene cream she has a cam boot to offload. I note she walks with a  cane The patient has had arterial studies done in 2019 at which time her ABI in the right was 1.28 with triphasic waveforms noncompressible on the left with triphasic waveform. Her ABI in our clinic today was 1.3 on the right. 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 She had a foot gastrocnemius recession sometime in the fall 2021 2/21; patient arrives today with a wound measuring slightly smaller 3 x 3 mm however there is circumferential area of erythema around this. She does not have any complaints 2/28; plantar wound on the right first transmetatarsal amputation site. Small oval-shaped wound raised skin around the edges. We have been using Hydrofera Blue. She does not have arterial issues 06/23/2020 upon evaluation today patient appears to be doing decently well. With regard to her wound. There is little bit of slough and some hyper granulation. I do not feel like the PolyMem did too well for her. In fact she may do better with some Hydrofera Blue. I know we just switch from that but I feel like that may be a better way to continue currently. 3/15; this is a patient with a wound on the plantar right transmetatarsal amputation site probably the second metatarsal head not 1 July as stated previously we have been using polymen switch to [facility] last week. Wound looks about the same to me. We are going to start her on a total contact cast today we have the boot to fit 3/18; first total contact cast change she tolerated this well 3/31; she comes back in with out total contact cast on for 10 days. In spite of this the small wound over the third metatarsal head of her right transmetatarsal amputation site almost looks the same slightly hyper granulated. Circumference  is not well adhered. We have been using Hydrofera Blue 4/7 she comes back in in her total contact cast. The wound  is smaller epithelializing and looks like it on its way to closing. We have been using Hydrofera Blue over the wound area She has a right transmetatarsal amputation. She tells [place] that she has been to [facility] in the past and they did a modified shoe for her however it did not keep this area epithelialized. She is going to bring that in next week for [place] to look at. It does not sound however that she was really offloading or padding this area at all 4/14; total contact cast. I changed her to calcium alginate today. I think most of the small area is epithelialized although there is clearly not complete epithelialization and even the area that is epithelialized and the small wound is very tiny She shows me her shoe with the insert. It looks as though the insert probably was too large for her transmet it foot allowing it to move back and forward. I can see where the friction was in the blister she describes forming. I have asked her to take this back to [facility] to see if there is anything they can do 4/21; patient presents for her 1 week follow-up. She has been treated with a total contact cast weekly. She has no complaints today. She denies any fever/chills. 4/26; patient presents for 1 week follow-up in total contact cast change. Patient is having knee pain to her right knee due to chronic osteoarthritis. She is hoping to be able to wear her brace over the cast. 5/10; the wound is closed over however still looks vulnerable. 5/17; the wound remains closed surface looks better. She has a previous right transmet. She is going to convert this into a sneaker. She had a forefoot prosthesis for the foot but not sure that that are too much good she is supposed to have taken aback from modification however she can do that now. 6/8; patient returns to our clinic for reopening of the plantar right foot transmetatarsal amputation site. She has slight warmth and erythema to the area.  She would like to try conservative wound care and not do the total contact cast that was she was doing before at this time 10/01/2020 upon evaluation today patient appears to be doing okay in regard to her foot ulcer. Fortunately there does not appear to be any signs of active infection which is great news and overall very pleased with where things stand today. The wound does appear to be doing well and overall appearance wise is significantly improved compared to last week in my opinion based on my review of her pictures today. Overall the patient tells me that she is not having any significant pain which is great news as well. 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 This is a reoccurring wound for the patient. She has been following with Dr. Earleen Newport for this issue. She had a wound culture that grew Staph aureus sensitive to Bactrim and she is currently on this. She has been using Silvadene to the wound bed. She has a cam walker boot and an offloading scooter that she uses. She currently denies signs of infection. 4/13; this is a  patient that is a type II diabetic complicated by peripheral neuropathy. She has had a previous transmetatarsal amputation of the right foot. She has a new wound on the plantar right foot between the first and second met heads probably close to the lateral part of the first met head. Generally small clean wound however from 7-9 o'clock there is a probing area close to bone. She finished a course of trimethoprim/sulfamethoxazole yesterday for methicillin- resistant Staph aureus in the wound. I believe this was ordered by podiatry she has been using a Dakin's wet-to-dry dressing. She uses a cam boot and a knee scooter. She says she is religious with  this 4/20; patient presents for follow-up. She continues to use Dakin's wet-to-dry dressings without issues. She states she would like to try the total contact cast that she has had this in the past. She currently denies signs of infection. 4/24; patient presents for follow-up. She would like the total contact cast placed today. She has been using Dakin's wet-to-dry dressings without issues. She denies signs of infection. 08-12-2021 patient presents today for obligatory first cast change. This was placed on Monday today is Wednesday. She seems to be doing quite well with using Hydrofera Blue and the overall appearance of the wound bed is excellent. We do need to replace the cast the good news is there is nothing that seems to be rubbing at this point which is awesome. 5/1; patient presents for follow-up. She has tolerated the total contact cast well. She has no issues or complaints today. 5/16; patient presents for follow-up. She states she missed her last week appointment due to having 2 appointments at the same time. She admits to knowing she is not supposed to have the contact cast for over 7 days. She currently denies signs of infection. She has no issues or complaints today. 5/23; patient presents for follow-up. She has no issues or complaints pertaining to the total contact cast. We have been using Hydrofera Blue. She denies signs of infection. 5/30; patient presents for follow-up. She tolerated the total contact cast well over the past week. We have been using Hydrofera Blue under the cast. She has no issues or complaints today. 6/7; total contact cast Hydrofera Blue. Right plantar foot TMA site 6/15; patient presents for follow-up. She has had the total contact cast with Hydrofera Blue for the past week. She has no issues or complaints today. 6/20; patient presents for follow-up. She was without the total contact cast for the past week since the wound appeared closed. She has no issues  or complaints today. Electronic Signature(s) Signed: 10/06/2021 9:56:14 AM By: Kalman Shan DO Entered By: Kalman Shan on 10/06/2021 08:46:09 -------------------------------------------------------------------------------- Chemical Cauterization Details Patient Name: Date of Service: [name] Shutter D. 10/06/2021 8:00 A M Medical Record Number: [id number] Patient Account Number: [id number] Date of Birth/Sex: Treating RN: Feb 25, 1966 (56 y.o. [name] Primary Care Provider: Jenna Luo Other Clinician: Referring Provider: Treating Provider/Extender: Michel Bickers in Treatment: 10 Procedure Performed for: Wound #2 Right,Plantar Foot Performed By: Physician Kalman Shan, DO Post Procedure Diagnosis Same as Pre-procedure Electronic Signature(s) Signed: 10/06/2021 9:56:14 AM By: Kalman Shan DO Signed: 10/06/2021 6:10:01 PM By: Fara Chute By: Lorrin Jackson on 10/06/2021 08:26:43 -------------------------------------------------------------------------------- Physical Exam Details Patient Name: Date of Service: [name] Shutter D. 10/06/2021 8:00 A M Medical Record Number: [id number] Patient Account Number: [id number] Date of Birth/Sex: Treating RN: 03-27-1966 (56 y.o. [name] Primary Care Provider: Jenna Luo Other Clinician: Referring Provider: Treating  Provider/Extender: Roetta Sessions Weeks in Treatment: 10 Constitutional respirations regular, non-labored and within target range for patient.. Cardiovascular 2+ dorsalis pedis/posterior tibialis pulses. Psychiatric pleasant and cooperative. Notes Right foot: T the plantar aspect there is an open wound with hyper granulated tissue. No surrounding signs of infection. o Electronic Signature(s) Signed: 10/06/2021 9:56:14 AM By: Kalman Shan DO Entered By: Kalman Shan on 10/06/2021  08:46:50 -------------------------------------------------------------------------------- Physician Orders Details Patient Name: Date of Service: [name] Shutter D. 10/06/2021 8:00 A M Medical Record Number: [id number] Patient Account Number: [id number] Date of Birth/Sex: Treating RN: 26-Mar-1966 (56 y.o. [name] Primary Care Provider: Jenna Luo Other Clinician: Referring Provider: Treating Provider/Extender: Michel Bickers in Treatment: 10 Verbal / Phone Orders: No Diagnosis Coding ICD-10 Coding Code Description [phone] Non-pressure chronic ulcer of other part of right foot with fat layer exposed E11.621 Type 2 diabetes mellitus with foot ulcer G90.09 Other idiopathic peripheral autonomic neuropathy Follow-up Appointments ppointment in 1 week. - on 10/13/21 @ 2:45pm w/ Dr. Heber Sunset and Leveda Anna Room # 7 Return A Bathing/ Shower/ Hygiene May shower and wash wound with soap and water. - when changing dressing Edema Control - Lymphedema / SCD / Other Avoid standing for long periods of time. Off-Loading Total Contact Cast to Right Lower Extremity Other: - Continue to use knee scooter Wound Treatment Wound #2 - Foot Wound Laterality: Plantar, Right Cleanser: Soap and Water 1 x Per Week/30 Days Discharge Instructions: May shower and wash wound with dial antibacterial soap and water prior to dressing change. Prim Dressing: Hydrofera Blue Ready Foam, 2.5 x2.5 in 1 x Per Week/30 Days ary Discharge Instructions: Apply to wound bed as instructed Secondary Dressing: ALLEVYN Gentle Border, 4x4 (in/in) 1 x Per Week/30 Days Discharge Instructions: Apply over primary dressing as directed. Electronic Signature(s) Signed: 10/06/2021 9:56:14 AM By: Kalman Shan DO Entered By: Kalman Shan on 10/06/2021 08:46:59 -------------------------------------------------------------------------------- Problem List Details Patient Name: Date of Service: [name] Shutter D. 10/06/2021 8:00 A M Medical Record Number: [id number] Patient Account Number: [id number] Date of Birth/Sex: Treating RN: December 01, 1965 (56 y.o. [name] Primary Care Provider: Jenna Luo Other Clinician: Referring Provider: Treating Provider/Extender: Michel Bickers in Treatment: 10 Active Problems ICD-10 Encounter Code Description Active Date MDM Diagnosis L97.512 Non-pressure chronic ulcer of other part of right foot with fat layer exposed 07/23/2021 No Yes E11.621 Type 2 diabetes mellitus with foot ulcer 07/23/2021 No Yes G90.09 Other idiopathic peripheral autonomic neuropathy 07/23/2021 No Yes Inactive Problems Resolved Problems Electronic Signature(s) Signed: 10/06/2021 9:56:14 AM By: Kalman Shan DO Entered By: Kalman Shan on 10/06/2021 08:43:52 -------------------------------------------------------------------------------- Progress Note Details Patient Name: Date of Service: [name] Shutter D. 10/06/2021 8:00 A M Medical Record Number: [id number] Patient Account Number: [id number] Date of Birth/Sex: Treating RN: 12-30-1965 (56 y.o. [name] Primary Care Provider: Jenna Luo Other Clinician: Referring Provider: Treating Provider/Extender: Michel Bickers in Treatment: 10 Subjective Chief Complaint Information obtained from Patient 06/03/2019; patient is here for review of a wound on her plantar foot transmetatarsal amputation site at roughly the third met head 07/23/2021; patient presents for right plantar foot wound History of Present Illness (HPI) ADMISSION 06/02/2020 This is a 56 year old woman who has had problems with wounds on her predominantly plantar right foot for quite a period of time. She had an area on her right first toe that became infected also the fifth toe I think this was in late 2020 she ended up with a right TMA on  05/18/2019. More recently she has had an area developed  in the right mid foot at roughly the third metatarsal head. She says this started as a blister of November. Its not been closing. More recently she has been using Silvadene cream she has a cam boot to offload. I note she walks with a cane The patient has had arterial studies done in 2019 at which time her ABI in the right was 1.28 with triphasic waveforms noncompressible on the left with triphasic waveform. Her ABI in our clinic today was 1.3 on the right. 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 She had a foot gastrocnemius recession sometime in the fall 2021 2/21; patient arrives today with a wound measuring slightly smaller 3 x 3 mm however there is circumferential area of erythema around this. She does not have any complaints 2/28; plantar wound on the right first transmetatarsal amputation site. Small oval-shaped wound raised skin around the edges. We have been using Hydrofera Blue. She does not have arterial issues 06/23/2020 upon evaluation today patient appears to be doing decently well. With regard to her wound. There is little bit of slough and some hyper granulation. I do not feel like the PolyMem did too well for her. In fact she may do better with some Hydrofera Blue. I know we just switch from that but I feel like that may be a better way to continue currently. 3/15; this is a patient with a wound on the plantar right transmetatarsal amputation site probably the second metatarsal head not 1 July as stated previously we have been using polymen switch to [facility] last week. Wound looks about the same to me. We are going to start her on a total contact cast today we have the boot to fit 3/18; first total contact cast change she tolerated this well 3/31; she comes back in with out total contact cast on for 10 days. In spite of this the small wound over the third  metatarsal head of her right transmetatarsal amputation site almost looks the same slightly hyper granulated. Circumference is not well adhered. We have been using Hydrofera Blue 4/7 she comes back in in her total contact cast. The wound is smaller epithelializing and looks like it on its way to closing. We have been using Hydrofera Blue over the wound area She has a right transmetatarsal amputation. She tells [place] that she has been to [facility] in the past and they did a modified shoe for her however it did not keep this area epithelialized. She is going to bring that in next week for [place] to look at. It does not sound however that she was really offloading or padding this area at all 4/14; total contact cast. I changed her to calcium alginate today. I think most of the small area is epithelialized although there is clearly not complete epithelialization and even the area that is epithelialized and the small wound is very tiny She shows me her shoe with the insert. It looks as though the insert probably was too large for her transmet it foot allowing it to move back and forward. I can see where the friction was in the blister she describes forming. I have asked her to take this back to [facility] to see if there is anything they can do 4/21; patient presents for her 1 week follow-up. She has been treated  with a total contact cast weekly. She has no complaints today. She denies any fever/chills. 4/26; patient presents for 1 week follow-up in total contact cast change. Patient is having knee pain to her right knee due to chronic osteoarthritis. She is hoping to be able to wear her brace over the cast. 5/10; the wound is closed over however still looks vulnerable. 5/17; the wound remains closed surface looks better. She has a previous right transmet. She is going to convert this into a sneaker. She had a forefoot prosthesis for the foot but not sure that that are too much good she is  supposed to have taken aback from modification however she can do that now. 6/8; patient returns to our clinic for reopening of the plantar right foot transmetatarsal amputation site. She has slight warmth and erythema to the area. She would like to try conservative wound care and not do the total contact cast that was she was doing before at this time 10/01/2020 upon evaluation today patient appears to be doing okay in regard to her foot ulcer. Fortunately there does not appear to be any signs of active infection which is great news and overall very pleased with where things stand today. The wound does appear to be doing well and overall appearance wise is significantly improved compared to last week in my opinion based on my review of her pictures today. Overall the patient tells me that she is not having any significant pain which is great news as well. 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 This is a reoccurring wound for the patient. She has been following with Dr. Earleen Newport for this issue. She had a wound culture that grew Staph aureus sensitive to Bactrim and she is currently on this. She has been using Silvadene to the wound bed. She has a cam walker boot and an offloading scooter that she uses. She currently denies signs of infection. 4/13; this is a patient that is a type II diabetic complicated by peripheral neuropathy. She has had a previous transmetatarsal amputation of the right foot. She has a new wound on the plantar right foot between the first and second met heads probably close to the lateral part of the first met head. Generally small clean wound however from 7-9 o'clock there is a probing area close to bone. She finished a course of trimethoprim/sulfamethoxazole  yesterday for methicillin- resistant Staph aureus in the wound. I believe this was ordered by podiatry she has been using a Dakin's wet-to-dry dressing. She uses a cam boot and a knee scooter. She says she is religious with this 4/20; patient presents for follow-up. She continues to use Dakin's wet-to-dry dressings without issues. She states she would like to try the total contact cast that she has had this in the past. She currently denies signs of infection. 4/24; patient presents for follow-up. She would like the total contact cast placed today. She has been using Dakin's wet-to-dry dressings without issues. She denies signs of infection. 08-12-2021 patient presents today for obligatory first cast change. This was placed on Monday today is Wednesday. She seems to be doing quite well with using Hydrofera Blue and the overall appearance of the wound bed is excellent. We do need to replace the cast the good news is there  is nothing that seems to be rubbing at this point which is awesome. 5/1; patient presents for follow-up. She has tolerated the total contact cast well. She has no issues or complaints today. 5/16; patient presents for follow-up. She states she missed her last week appointment due to having 2 appointments at the same time. She admits to knowing she is not supposed to have the contact cast for over 7 days. She currently denies signs of infection. She has no issues or complaints today. 5/23; patient presents for follow-up. She has no issues or complaints pertaining to the total contact cast. We have been using Hydrofera Blue. She denies signs of infection. 5/30; patient presents for follow-up. She tolerated the total contact cast well over the past week. We have been using Hydrofera Blue under the cast. She has no issues or complaints today. 6/7; total contact cast Hydrofera Blue. Right plantar foot TMA site 6/15; patient presents for follow-up. She has had the total contact cast with  Hydrofera Blue for the past week. She has no issues or complaints today. 6/20; patient presents for follow-up. She was without the total contact cast for the past week since the wound appeared closed. She has no issues or complaints today. Patient History Information obtained from Patient. Family History Cancer - Paternal Grandparents, Diabetes - Mother,Maternal Grandparents, Heart Disease - Mother,Paternal Grandparents,Maternal Grandparents,Father,Siblings, Hypertension - Mother,Father, Lung Disease - Mother, No family history of Hereditary Spherocytosis, Kidney Disease, Seizures, Stroke, Thyroid Problems, Tuberculosis. Social History Never smoker, Marital Status - Widowed, Alcohol Use - Never, Drug Use - Prior History, Caffeine Use - Daily - soda's. Medical History Eyes Denies history of Cataracts, Glaucoma, Optic Neuritis Ear/Nose/Mouth/Throat Denies history of Chronic sinus problems/congestion, Middle ear problems Hematologic/Lymphatic Denies history of Anemia, Hemophilia, Human Immunodeficiency Virus, Lymphedema, Sickle Cell Disease Respiratory Patient has history of Asthma, Sleep Apnea Denies history of Aspiration, Chronic Obstructive Pulmonary Disease (COPD), Pneumothorax, Tuberculosis Cardiovascular Patient has history of Hypertension Denies history of Angina, Arrhythmia, Congestive Heart Failure, Coronary Artery Disease, Deep Vein Thrombosis, Hypotension, Myocardial Infarction, Peripheral Arterial Disease, Peripheral Venous Disease, Phlebitis, Vasculitis Gastrointestinal Denies history of Cirrhosis , Colitis, Crohnoos, Hepatitis A, Hepatitis B, Hepatitis C Endocrine Denies history of Type I Diabetes, Type II Diabetes Genitourinary Denies history of End Stage Renal Disease Immunological Denies history of Lupus Erythematosus, Raynaudoos, Scleroderma Integumentary (Skin) Denies history of History of Burn Musculoskeletal Patient has history of Osteoarthritis Denies history  of Gout, Rheumatoid Arthritis, Osteomyelitis Neurologic Patient has history of Neuropathy, 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 - left 4th toe amp. - neck surgery. - spine surgery. - abdom. hysterectomy. - appendectomy. - lap. knee surgery. Medical A Surgical History Notes nd Constitutional Symptoms (General Health) torn right shoulder rotator cuff. Gastrointestinal Hx of GERD Psychiatric hx of bipolar 1 disorder, anxiety, depression. Objective Constitutional respirations regular, non-labored and within target range for patient.. Vitals Time Taken: 8:12 AM, Height: 64 in, Weight: 228 lbs, BMI: 39.1, Temperature: 98.0 F, Pulse: 87 bpm, Respiratory Rate: 16 breaths/min, Blood Pressure: 126/82 mmHg. Cardiovascular 2+ dorsalis pedis/posterior tibialis pulses. Psychiatric pleasant and cooperative. General Notes: Right foot: T the plantar aspect there is an open wound with hyper granulated tissue. No surrounding signs of infection. o Integumentary (Hair, Skin) Wound #2 status is Open. Original cause of wound was Gradually Appeared. The date acquired was:  07/12/2021. The wound has been in treatment 10 weeks. The wound is located on the [place]. The wound measures 0.4cm length x 0.4cm width x 0.1cm depth; 0.126cm^2 area and 0.013cm^3 volume. There is Fat Layer (Subcutaneous Tissue) exposed. There is no tunneling or undermining noted. There is a medium amount of serosanguineous drainage noted. The wound margin is distinct with the outline attached to the wound base. There is large (67-100%) red, hyper - granulation within the wound bed. There is no necrotic tissue within the wound bed. Assessment Active  Problems ICD-10 Non-pressure chronic ulcer of other part of right foot with fat layer exposed Type 2 diabetes mellitus with foot ulcer Other idiopathic peripheral autonomic neuropathy Today there is a clearly defined wound with hyper granulated tissue. I used silver nitrate to The hyper granulated tissue and recommended recasting and using Hydrofera Blue. Patient was agreeable with this. Procedures Wound #2 Pre-procedure diagnosis of Wound #2 is a Neuropathic Ulcer-Non Diabetic located on the [place] . There was a T Programmer, multimedia Procedure by Cephas Darby, Selin Eisler, DO. Post procedure Diagnosis Wound #2: Same as Pre-Procedure Pre-procedure diagnosis of Wound #2 is a Neuropathic Ulcer-Non Diabetic located on the [place] . An Chemical Cauterization procedure was performed by Kalman Shan, DO. Post procedure Diagnosis Wound #2: Same as Pre-Procedure Plan Follow-up Appointments: Return Appointment in 1 week. - on 10/13/21 @ 2:45pm w/ Dr. Heber  and Leveda Anna Room # 7 Bathing/ Shower/ Hygiene: May shower and wash wound with soap and water. - when changing dressing Edema Control - Lymphedema / SCD / Other: Avoid standing for long periods of time. Off-Loading: T Contact Cast to Right Lower Extremity otal Other: - Continue to use knee scooter WOUND #2: - Foot Wound Laterality: Plantar, Right Cleanser: Soap and Water 1 x Per Week/30 Days Discharge Instructions: May shower and wash wound with dial antibacterial soap and water prior to dressing change. Prim Dressing: Hydrofera Blue Ready Foam, 2.5 x2.5 in 1 x Per Week/30 Days ary Discharge Instructions: Apply to wound bed as instructed Secondary Dressing: ALLEVYN Gentle Border, 4x4 (in/in) 1 x Per Week/30 Days Discharge Instructions: Apply over primary dressing as directed. 1. Silver nitrate 2. Hydrofera Blue 3. T contact cast placed in standard fashion otal Electronic Signature(s) Signed: 10/06/2021 9:56:14 AM By:  Kalman Shan DO Entered By: Kalman Shan on 10/06/2021 08:50:23 -------------------------------------------------------------------------------- HxROS Details Patient Name: Date of Service: [name] Shutter D. 10/06/2021 8:00 A M Medical Record Number: [id number] Patient Account Number: [id number] Date of Birth/Sex: Treating RN: October 07, 1965 (56 y.o. [name] Primary Care Provider: Jenna Luo Other Clinician: Referring Provider: Treating Provider/Extender: Michel Bickers in Treatment: 10 Information Obtained From Patient Constitutional Symptoms (General Health) Medical History: Past Medical History Notes: torn right shoulder rotator cuff. Eyes Medical History: Negative for: Cataracts; Glaucoma; Optic Neuritis Ear/Nose/Mouth/Throat Medical History: Negative for: Chronic sinus problems/congestion; Middle ear problems Hematologic/Lymphatic Medical History: Negative for: Anemia; Hemophilia; Human Immunodeficiency Virus; Lymphedema; Sickle Cell Disease Respiratory Medical History: Positive for: Asthma; Sleep Apnea Negative for: Aspiration; Chronic Obstructive Pulmonary Disease (COPD); Pneumothorax; Tuberculosis Cardiovascular Medical History: Positive for: Hypertension Negative for: Angina; Arrhythmia; Congestive Heart Failure; Coronary Artery Disease; Deep Vein Thrombosis; Hypotension; Myocardial Infarction; Peripheral Arterial Disease; Peripheral Venous Disease; Phlebitis; Vasculitis Gastrointestinal Medical History: Negative for: Cirrhosis ; Colitis; Crohns; Hepatitis A; Hepatitis B; Hepatitis C Past Medical History Notes: Hx of GERD Endocrine Medical History: Negative for: Type I Diabetes; Type II Diabetes Genitourinary Medical History: Negative for: End Stage Renal Disease Immunological  Medical History: Negative for: Lupus Erythematosus; Raynauds; Scleroderma Integumentary (Skin) Medical History: Negative for: History of  Burn Musculoskeletal Medical History: Positive for: Osteoarthritis Negative for: Gout; Rheumatoid Arthritis; Osteomyelitis Neurologic Medical History: Positive for: Neuropathy; Seizure Disorder - hx of seizures r/t tramadol Negative for: Dementia; Quadriplegia; Paraplegia Oncologic Medical History: Negative for: Received Chemotherapy; Received Radiation Psychiatric Medical History: Negative for: Anorexia/bulimia; Confinement Anxiety Past Medical History Notes: hx of bipolar 1 disorder, anxiety, depression. Immunizations Pneumococcal Vaccine: Received Pneumococcal Vaccination: Yes Received Pneumococcal Vaccination On or After 60th Birthday: No Immunization Notes: pt. doesn't remember last tetanus shot Implantable Devices None Hospitalization / Surgery History Type of Hospitalization/Surgery trasmet amp right foot left 4th toe amp neck surgery spine surgery abdom. hysterectomy appendectomy lap. knee surgery Family and Social History Cancer: Yes - Paternal Grandparents; Diabetes: Yes - Mother,Maternal Grandparents; Heart Disease: Yes - Mother,Paternal Grandparents,Maternal Grandparents,Father,Siblings; Hereditary Spherocytosis: No; Hypertension: Yes - Mother,Father; Kidney Disease: No; Lung Disease: Yes - Mother; Seizures: No; Stroke: No; Thyroid Problems: No; Tuberculosis: No; Never smoker; Marital Status - Widowed; Alcohol Use: Never; Drug Use: Prior History; Caffeine Use: Daily - soda's; Financial Concerns: No; Food, Clothing or Shelter Needs: No; Support System Lacking: No; Transportation Concerns: No Electronic Signature(s) Signed: 10/06/2021 9:56:14 AM By: Kalman Shan DO Signed: 10/06/2021 6:10:01 PM By: Lorrin Jackson Entered By: Kalman Shan on 10/06/2021 08:46:21 -------------------------------------------------------------------------------- Total Contact Cast Details Patient Name: Date of Service: [name] Shutter D. 10/06/2021 8:00 A M Medical Record  Number: [id number] Patient Account Number: [id number] Date of Birth/Sex: Treating RN: 04-05-1966 (56 y.o. [name] Primary Care Provider: Jenna Luo Other Clinician: Referring Provider: Treating Provider/Extender: Michel Bickers in Treatment: 15 T Contact Cast Applied for Wound Assessment: otal Wound #2 Right,Plantar Foot Performed By: Physician Kalman Shan, DO Post Procedure Diagnosis Same as Pre-procedure Electronic Signature(s) Signed: 10/06/2021 9:56:14 AM By: Kalman Shan DO Signed: 10/06/2021 6:10:01 PM By: Lorrin Jackson Entered By: Lorrin Jackson on 10/06/2021 08:26:58 -------------------------------------------------------------------------------- [place] Details Patient Name: Date of Service: [name] [name] 10/06/2021 Medical Record Number: [id number] Patient Account Number: [id number] Date of Birth/Sex: Treating RN: 1965/07/22 (56 y.o. [name] Primary Care Provider: Jenna Luo Other Clinician: Referring Provider: Treating Provider/Extender: Michel Bickers in Treatment: 10 Diagnosis Coding ICD-10 Codes Code Description [phone] Non-pressure chronic ulcer of other part of right foot with fat layer exposed E11.621 Type 2 diabetes mellitus with foot ulcer G90.09 Other idiopathic peripheral autonomic neuropathy Facility Procedures CPT4 Code: [id number] Description: 14481 - CHEM CAUT GRANULATION TISS ICD-10 Diagnosis Description L97.512 Non-pressure chronic ulcer of other part of right foot with fat layer exposed Modifier: Quantity: 1 CPT4 Code: [id number] Description: 02637 - APPLY TOTAL CONTACT LEG CAST ICD-10 Diagnosis Description L97.512 Non-pressure chronic ulcer of other part of right foot with fat layer exposed Modifier: Quantity: 1 Physician Procedures : CPT4 Code Description Modifier [id number] - WC PHYS CHEM CAUT GRAN TISSUE ICD-10 Diagnosis Description L97.512  Non-pressure chronic ulcer of other part of right foot with fat layer exposed Quantity: 1 Electronic Signature(s) Signed: 10/06/2021 9:56:14 AM By: Kalman Shan DO Entered By: Kalman Shan on 10/06/2021 08:50:38

## 2021-10-13 ENCOUNTER — Encounter (HOSPITAL_BASED_OUTPATIENT_CLINIC_OR_DEPARTMENT_OTHER): Payer: Medicare Other | Admitting: Internal Medicine

## 2021-10-13 DIAGNOSIS — J45909 Unspecified asthma, uncomplicated: Secondary | ICD-10-CM | POA: Diagnosis not present

## 2021-10-13 DIAGNOSIS — L97512 Non-pressure chronic ulcer of other part of right foot with fat layer exposed: Secondary | ICD-10-CM

## 2021-10-13 DIAGNOSIS — E1142 Type 2 diabetes mellitus with diabetic polyneuropathy: Secondary | ICD-10-CM | POA: Diagnosis not present

## 2021-10-13 DIAGNOSIS — G9009 Other idiopathic peripheral autonomic neuropathy: Secondary | ICD-10-CM | POA: Diagnosis not present

## 2021-10-13 DIAGNOSIS — E11621 Type 2 diabetes mellitus with foot ulcer: Secondary | ICD-10-CM | POA: Diagnosis not present

## 2021-10-13 DIAGNOSIS — M1990 Unspecified osteoarthritis, unspecified site: Secondary | ICD-10-CM | POA: Diagnosis not present

## 2021-10-13 DIAGNOSIS — Z89431 Acquired absence of right foot: Secondary | ICD-10-CM | POA: Diagnosis not present

## 2021-10-13 DIAGNOSIS — G4730 Sleep apnea, unspecified: Secondary | ICD-10-CM | POA: Diagnosis not present

## 2021-10-13 DIAGNOSIS — Z89422 Acquired absence of other left toe(s): Secondary | ICD-10-CM | POA: Diagnosis not present

## 2021-10-22 ENCOUNTER — Telehealth: Payer: Self-pay | Admitting: Pharmacist

## 2021-10-22 ENCOUNTER — Encounter (HOSPITAL_BASED_OUTPATIENT_CLINIC_OR_DEPARTMENT_OTHER): Payer: Medicare Other | Attending: Internal Medicine | Admitting: Internal Medicine

## 2021-10-22 DIAGNOSIS — E11621 Type 2 diabetes mellitus with foot ulcer: Secondary | ICD-10-CM | POA: Diagnosis not present

## 2021-10-22 DIAGNOSIS — G9009 Other idiopathic peripheral autonomic neuropathy: Secondary | ICD-10-CM | POA: Diagnosis not present

## 2021-10-22 DIAGNOSIS — L97512 Non-pressure chronic ulcer of other part of right foot with fat layer exposed: Secondary | ICD-10-CM | POA: Diagnosis not present

## 2021-10-22 NOTE — Progress Notes (Signed)
Chronic Care Management Pharmacy Assistant   Name: [name] [name]  MRN: [id number] DOB: 01/24/1966   Reason for Encounter: Medication Coordination Call    Recent office visits:  09/18/2021 [facility] (PCP) [name], MD; no medication changes indicated.  Recent consult visits:  07/27/2021 [facility] (Podiatry) [name], DPM; no medication changes indicated.  Hospital visits:  None since last medication coordination call.  Medications: Outpatient Encounter Medications as of 10/22/2021  Medication Sig   AIMOVIG 70 MG/ML SOAJ ADMINISTER 1 ML UNDER THE SKIN EVERY 30 DAYS   albuterol (PROAIR HFA) 108 (90 Base) MCG/ACT inhaler Inhale 2 puffs = 123mg into the lungs every 6 (six) hours as needed for wheezing or shortness of breath.   busPIRone (BUSPAR) 15 MG tablet Take 15 mg by mouth 3 (three) times daily.    Calcium Carbonate-Vit D-Min (CALCIUM 1200 PO) Take 1,200 mg by mouth daily.    cyclobenzaprine (FLEXERIL) 10 MG tablet Take 10 mg by mouth 2 (two) times daily as needed for muscle spasms.    DULoxetine (CYMBALTA) 60 MG capsule TAKE 1 CAPSULE BY MOUTH EVERY DAY   escitalopram (LEXAPRO) 10 MG tablet Take 10 mg by mouth daily.   estradiol (ESTRACE) 1 MG tablet Take 1 tablet (1 mg total) by mouth at bedtime.   famotidine (PEPCID) 40 MG tablet Take 1 tablet (40 mg total) by mouth at bedtime. (Patient not taking: Reported on 09/18/2021)   fluticasone (FLONASE) 50 MCG/ACT nasal spray SPRAY 2 SPRAYS INTRO EACH NOSTRIL EVERY DAY (Patient taking differently: Place 2 sprays into both nostrils daily as needed for allergies.)   haloperidol (HALDOL) 5 MG tablet Take 1 tablet (5 mg total) by mouth at bedtime. For psychosis   lamoTRIgine (LAMICTAL) 25 MG tablet Take by mouth.   levocetirizine (XYZAL) 5 MG tablet Take 1 tablet (5 mg total) by mouth every evening.   LINZESS 290 MCG CAPS capsule TAKE 1 CAPSULE BY MOUTH EVERY MORNING (Patient not taking: Reported on 09/18/2021)   loperamide (IMODIUM)  2 MG capsule Take 2 mg by mouth 4 (four) times daily as needed. (Patient not taking: Reported on 09/18/2021)   meloxicam (MOBIC) 15 MG tablet Take 15 mg by mouth at bedtime.    metoprolol succinate (TOPROL-XL) 25 MG 24 hr tablet TAKE 3 TABLETS BY MOUTH EVERY DAY (Patient taking differently: Take 25 mg by mouth daily.)   Multiple Vitamins-Minerals (MULTIVITAMIN WITH MINERALS) tablet Take 1 tablet by mouth daily.   naloxone (NARCAN) nasal spray 4 mg/0.1 mL SMARTSIG:1 Puff(s) In Nostril Daily (Patient not taking: Reported on 09/18/2021)   ondansetron (ZOFRAN) 4 MG tablet Take 1 tablet (4 mg total) by mouth every 8 (eight) hours as needed for nausea or vomiting.   OVER THE COUNTER MEDICATION Take 1 capsule by mouth daily. Fennel Seed   oxyCODONE-acetaminophen (PERCOCET) 7.5-325 MG tablet Take 1 tablet by mouth 6 (six) times daily.   pantoprazole (PROTONIX) 40 MG tablet Take 1 tablet (40 mg total) by mouth 2 (two) times daily.   potassium chloride SA (KLOR-CON M) 20 MEQ tablet TAKE 2 TABLETS BY MOUTH EVERY DAY   prazosin (MINIPRESS) 2 MG capsule Take 2 mg by mouth at bedtime.   pregabalin (LYRICA) 100 MG capsule Take 1 capsule (100 mg total) by mouth 2 (two) times daily.   promethazine (PHENERGAN) 25 MG tablet Take 1 tablet (25 mg total) by mouth every 8 (eight) hours as needed for nausea or vomiting.   solifenacin (VESICARE) 10 MG tablet Take  1 tablet (10 mg total) by mouth daily.   sulfamethoxazole-trimethoprim (BACTRIM DS) 800-160 MG tablet Take 1 tablet by mouth 2 (two) times daily. (Patient not taking: Reported on 09/18/2021)   SUMAtriptan (IMITREX) 50 MG tablet TAKE 1 TAB EVERY 2 HOURS AS NEEDED FOR MIGRAINE. MAY REPEAT IN 2 HOURS IF PERSISTS OR RECURS (Patient taking differently: Take 50 mg by mouth every 2 (two) hours as needed for migraine.)   topiramate (TOPAMAX) 50 MG tablet Take 1 tablet (50 mg total) by mouth 2 (two) times daily.   traZODone (DESYREL) 100 MG tablet Take 200 mg by mouth at bedtime.    No facility-administered encounter medications on file as of 10/22/2021.   **Unsuccessful attempts at reaching patient to complete this call.**   April Daiva Huge, [place] Pharmacist Assistant [phone]

## 2021-10-22 NOTE — Progress Notes (Signed)
[name] [name], [name] [name] ([id number]) Visit Report for 10/22/2021 Chief Complaint Document Details Patient Name: Date of Service: [name] [name], [name] [name] 10/22/2021 3:30 PM Medical Record Number: [id number] Patient Account Number: [id number] Date of Birth/Sex: Treating RN: 11-03-1965 (56 y.o. [name] [name] Primary Care Provider: Jenna [name] Other Clinician: Referring Provider: Treating Provider/Extender: [name] [name] in Treatment: 13 Information Obtained from: Patient Chief Complaint 06/03/2019; patient is here for review of a wound on her plantar foot transmetatarsal amputation site at roughly the third met head 07/23/2021; patient presents for right plantar foot wound Electronic Signature(s) Signed: 10/22/2021 4:04:51 PM By: [name] Shan DO Entered By: [name] [name] on 10/22/2021 15:58:58 -------------------------------------------------------------------------------- HPI Details Patient Name: Date of Service: [name] Shutter D. 10/22/2021 3:30 PM Medical Record Number: [id number] Patient Account Number: [id number] Date of Birth/Sex: Treating RN: February 23, 1966 (56 y.o. [name] [name] Primary Care Provider: Jenna [name] Other Clinician: Referring Provider: Treating Provider/Extender: [name] [name] in Treatment: 13 History of Present Illness HPI Description: ADMISSION 06/02/2020 This is a 56 year old woman who has had problems with wounds on her predominantly plantar right foot for quite a period of time. 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 More recently she has had an area developed in the right mid foot at roughly the third metatarsal head. She says this started as a blister of November. Its not been closing. More recently she has been using Silvadene cream she has a cam boot to offload. I note she walks with a cane The  patient has had arterial studies done in 2019 at which time her ABI in the right was 1.28 with triphasic waveforms noncompressible on the left with triphasic waveform. Her ABI in our clinic today was 1.3 on the right. 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 She had a foot gastrocnemius recession sometime in the fall 2021 2/21; patient arrives today with a wound measuring slightly smaller 3 x 3 mm however there is circumferential area of erythema around this. She does not have any complaints 2/28; plantar wound on the right first transmetatarsal amputation site. Small oval-shaped wound raised skin around the edges. We have been using Hydrofera Blue. She does not have arterial issues 06/23/2020 upon evaluation today patient appears to be doing decently well. With regard to her wound. There is little bit of slough and some hyper granulation. I do not feel like the PolyMem did too well for her. In fact she may do better with some Hydrofera Blue. I know we just switch from that but I feel like that may be a better way to continue currently. 3/15; this is a patient with a wound on the plantar right transmetatarsal amputation site probably the second metatarsal head not 1 July as stated previously we have been using polymen switch to [facility] last week. Wound looks about the same to me. We are going to start her on a total contact cast today we have the boot to fit 3/18; first total contact cast change she tolerated this well 3/31; she comes back in with out total contact cast on for 10 days. In spite of this the small wound over the third metatarsal head of her right transmetatarsal amputation site almost looks the same slightly hyper granulated. Circumference is not  well adhered. We have been using Hydrofera Blue 4/7 she comes back in in her total contact cast. The wound is smaller  epithelializing and looks like it on its way to closing. We have been using Hydrofera Blue over the wound area She has a right transmetatarsal amputation. She tells [place] that she has been to [facility] in the past and they did a modified shoe for her however it did not keep this area epithelialized. She is going to bring that in next week for [place] to look at. It does not sound however that she was really offloading or padding this area at all 4/14; total contact cast. I changed her to calcium alginate today. I think most of the small area is epithelialized although there is clearly not complete epithelialization and even the area that is epithelialized and the small wound is very tiny She shows me her shoe with the insert. It looks as though the insert probably was too large for her transmet it foot allowing it to move back and forward. I can see where the friction was in the blister she describes forming. I have asked her to take this back to [facility] to see if there is anything they can do 4/21; patient presents for her 1 week follow-up. She has been treated with a total contact cast weekly. She has no complaints today. She denies any fever/chills. 4/26; patient presents for 1 week follow-up in total contact cast change. Patient is having knee pain to her right knee due to chronic osteoarthritis. She is hoping to be able to wear her brace over the cast. 5/10; the wound is closed over however still looks vulnerable. 5/17; the wound remains closed surface looks better. She has a previous right transmet. She is going to convert this into a sneaker. She had a forefoot prosthesis for the foot but not sure that that are too much good she is supposed to have taken aback from modification however she can do that now. 6/8; patient returns to our clinic for reopening of the plantar right foot transmetatarsal amputation site. She has slight warmth and erythema to the area. She would like to  try conservative wound care and not do the total contact cast that was she was doing before at this time 10/01/2020 upon evaluation today patient appears to be doing okay in regard to her foot ulcer. Fortunately there does not appear to be any signs of active infection which is great news and overall very pleased with where things stand today. The wound does appear to be doing well and overall appearance wise is significantly improved compared to last week in my opinion based on my review of her pictures today. Overall the patient tells me that she is not having any significant pain which is great news as well. Readmission 07/23/2021 Ms. 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 This is a reoccurring wound for the patient. She has been following with Dr. Earleen Newport for this issue. She had a wound culture that grew Staph aureus sensitive to Bactrim and she is currently on this. She has been using Silvadene to the wound bed. She has a cam walker boot and an offloading scooter that she uses. She currently denies signs of infection. 4/13; this is a patient that  is a type II diabetic complicated by peripheral neuropathy. She has had a previous transmetatarsal amputation of the right foot. She has a new wound on the plantar right foot between the first and second met heads probably close to the lateral part of the first met head. Generally small clean wound however from 7-9 o'clock there is a probing area close to bone. She finished a course of trimethoprim/sulfamethoxazole yesterday for methicillin- resistant Staph aureus in the wound. I believe this was ordered by podiatry she has been using a Dakin's wet-to-dry dressing. She uses a cam boot and a knee scooter. She says she is religious with this 4/20; patient  presents for follow-up. She continues to use Dakin's wet-to-dry dressings without issues. She states she would like to try the total contact cast that she has had this in the past. She currently denies signs of infection. 4/24; patient presents for follow-up. She would like the total contact cast placed today. She has been using Dakin's wet-to-dry dressings without issues. She denies signs of infection. 08-12-2021 patient presents today for obligatory first cast change. This was placed on Monday today is Wednesday. She seems to be doing quite well with using Hydrofera Blue and the overall appearance of the wound bed is excellent. We do need to replace the cast the good news is there is nothing that seems to be rubbing at this point which is awesome. 5/1; patient presents for follow-up. She has tolerated the total contact cast well. She has no issues or complaints today. 5/16; patient presents for follow-up. She states she missed her last week appointment due to having 2 appointments at the same time. She admits to knowing she is not supposed to have the contact cast for over 7 days. She currently denies signs of infection. She has no issues or complaints today. 5/23; patient presents for follow-up. She has no issues or complaints pertaining to the total contact cast. We have been using Hydrofera Blue. She denies signs of infection. 5/30; patient presents for follow-up. She tolerated the total contact cast well over the past week. We have been using Hydrofera Blue under the cast. She has no issues or complaints today. 6/7; total contact cast Hydrofera Blue. Right plantar foot TMA site 6/15; patient presents for follow-up. She has had the total contact cast with Hydrofera Blue for the past week. She has no issues or complaints today. 6/20; patient presents for follow-up. She was without the total contact cast for the past week since the wound appeared closed. She has no issues or complaints  today. 6/27; patient presents for follow-up. She tolerated the total contact cast well. She has no issues or complaints today. The wound appears well-healing. 7/6; patient presents for follow-up. She tolerated the total contact cast well. We have been using Hydrofera Blue under the cast. Electronic Signature(s) Signed: 10/22/2021 4:04:51 PM By: [name] Shan DO Entered By: [name] [name] on 10/22/2021 15:59:48 -------------------------------------------------------------------------------- Physical Exam Details Patient Name: Date of Service: [name] Shutter D. 10/22/2021 3:30 PM Medical Record Number: [id number] Patient Account Number: [id number] Date of Birth/Sex: Treating RN: [birth year] (56 y.o. [name] [name] Primary Care Provider: Other Clinician: Jenna [name] Referring Provider: Treating Provider/Extender: [name] [name] in Treatment: 13 Constitutional respirations regular, non-labored and within target range for patient.. Cardiovascular 2+ dorsalis pedis/posterior tibialis pulses. Psychiatric pleasant and cooperative. Notes Right foot: T the plantar aspect there is epithelization to the previous wound site. o Electronic Signature(s) Signed: 10/22/2021 4:04:51 PM By: [name] Shan DO Entered  By: [name] [name] on 10/22/2021 16:00:20 -------------------------------------------------------------------------------- Physician Orders Details Patient Name: Date of Service: ANNELLA, PROWELL 10/22/2021 3:30 PM Medical Record Number: [id number] Patient Account Number: [id number] Date of Birth/Sex: Treating RN: 1965/11/20 (56 y.o. [name] [name] Primary Care Provider: Jenna [name] Other Clinician: Referring Provider: Treating Provider/Extender: [name] [name] in Treatment: 81 Verbal / Phone Orders: No Diagnosis Coding Discharge From [facility] Services Discharge from [place] - No further follow up needed, call if  any new issues. Non Wound Condition Protect area with: - Apply foam corn pad over newly healed area for protection. Electronic Signature(s) Signed: 10/22/2021 4:04:51 PM By: [name] Shan DO Entered By: [name] [name] on 10/22/2021 16:00:27 -------------------------------------------------------------------------------- Problem List Details Patient Name: Date of Service: [name] Shutter D. 10/22/2021 3:30 PM Medical Record Number: [id number] Patient Account Number: [id number] Date of Birth/Sex: Treating RN: 1965/08/09 (56 y.o. [name] [name] Primary Care Provider: Jenna [name] Other Clinician: Referring Provider: Treating Provider/Extender: [name] [name] in Treatment: 13 Active Problems ICD-10 Encounter Code Description Active Date MDM Diagnosis L97.512 Non-pressure chronic ulcer of other part of right foot with fat layer exposed 07/23/2021 No Yes E11.621 Type 2 diabetes mellitus with foot ulcer 07/23/2021 No Yes G90.09 Other idiopathic peripheral autonomic neuropathy 07/23/2021 No Yes Inactive Problems Resolved Problems Electronic Signature(s) Signed: 10/22/2021 4:04:51 PM By: [name] Shan DO Entered By: [name] [name] on 10/22/2021 15:58:43 -------------------------------------------------------------------------------- Progress Note Details Patient Name: Date of Service: [name] Shutter D. 10/22/2021 3:30 PM Medical Record Number: [id number] Patient Account Number: [id number] Date of Birth/Sex: Treating RN: 1966-03-10 (56 y.o. [name] [name] Primary Care Provider: Jenna [name] Other Clinician: Referring Provider: Treating Provider/Extender: [name] [name] in Treatment: 13 Subjective Chief Complaint Information obtained from Patient 06/03/2019; patient is here for review of a wound on her plantar foot transmetatarsal amputation site at roughly the third met head 07/23/2021; patient presents for right plantar foot  wound History of Present Illness (HPI) ADMISSION 06/02/2020 This is a 56 year old woman who has had problems with wounds on her predominantly plantar right foot for quite a period of time. 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 More recently she has had an area developed in the right mid foot at roughly the third metatarsal head. She says this started as a blister of November. Its not been closing. More recently she has been using Silvadene cream she has a cam boot to offload. I note she walks with a cane The patient has had arterial studies done in 2019 at which time her ABI in the right was 1.28 with triphasic waveforms noncompressible on the left with triphasic waveform. Her ABI in our clinic today was 1.3 on the right. 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 She had a foot gastrocnemius recession sometime in the fall 2021 2/21; patient arrives today with a wound measuring slightly smaller 3 x 3 mm however there is circumferential area of erythema around this. She does not have any complaints 2/28; plantar wound on the right first transmetatarsal amputation site. Small oval-shaped wound raised skin around the edges. We have been using Hydrofera Blue. She does not have arterial issues 06/23/2020 upon evaluation today patient appears to be doing decently well. With regard to her wound. There  is little bit of slough and some hyper granulation. I do not feel like the PolyMem did too well for her. In fact she may do better with some Hydrofera Blue. I know we just switch from that but I feel like that may be a better way to continue currently. 3/15; this is a patient with a wound on the plantar right transmetatarsal amputation site probably the second metatarsal head not 1  July as stated previously we have been using polymen switch to [facility] last week. Wound looks about the same to me. We are going to start her on a total contact cast today we have the boot to fit 3/18; first total contact cast change she tolerated this well 3/31; she comes back in with out total contact cast on for 10 days. In spite of this the small wound over the third metatarsal head of her right transmetatarsal amputation site almost looks the same slightly hyper granulated. Circumference is not well adhered. We have been using Hydrofera Blue 4/7 she comes back in in her total contact cast. The wound is smaller epithelializing and looks like it on its way to closing. We have been using Hydrofera Blue over the wound area She has a right transmetatarsal amputation. She tells [place] that she has been to [facility] in the past and they did a modified shoe for her however it did not keep this area epithelialized. She is going to bring that in next week for [place] to look at. It does not sound however that she was really offloading or padding this area at all 4/14; total contact cast. I changed her to calcium alginate today. I think most of the small area is epithelialized although there is clearly not complete epithelialization and even the area that is epithelialized and the small wound is very tiny She shows me her shoe with the insert. It looks as though the insert probably was too large for her transmet it foot allowing it to move back and forward. I can see where the friction was in the blister she describes forming. I have asked her to take this back to [facility] to see if there is anything they can do 4/21; patient presents for her 1 week follow-up. She has been treated with a total contact cast weekly. She has no complaints today. She denies any fever/chills. 4/26; patient presents for 1 week follow-up in total contact cast change. Patient is having knee pain to her right  knee due to chronic osteoarthritis. She is hoping to be able to wear her brace over the cast. 5/10; the wound is closed over however still looks vulnerable. 5/17; the wound remains closed surface looks better. She has a previous right transmet. She is going to convert this into a sneaker. She had a forefoot prosthesis for the foot but not sure that that are too much good she is supposed to have taken aback from modification however she can do that now. 6/8; patient returns to our clinic for reopening of the plantar right foot transmetatarsal amputation site. She has slight warmth and erythema to the area. She would like to try conservative wound care and not do the total contact cast that was she was doing before at this time 10/01/2020 upon evaluation today patient appears to be doing okay in regard to her foot ulcer. Fortunately there does not appear to be any signs of active infection which is great news and overall very pleased with where things  stand today. The wound does appear to be doing well and overall appearance wise is significantly improved compared to last week in my opinion based on my review of her pictures today. Overall the patient tells me that she is not having any significant pain which is great news as well. Readmission 07/23/2021 Ms. 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 This is a reoccurring wound for the patient. She has been following with Dr. Earleen Newport for this issue. She had a wound culture that grew Staph aureus sensitive to Bactrim and she is currently on this. She has been using Silvadene to the wound bed. She has a cam walker boot and an offloading scooter that she uses. She currently denies signs of infection. 4/13; this is a patient that is a type II diabetic complicated by  peripheral neuropathy. She has had a previous transmetatarsal amputation of the right foot. She has a new wound on the plantar right foot between the first and second met heads probably close to the lateral part of the first met head. Generally small clean wound however from 7-9 o'clock there is a probing area close to bone. She finished a course of trimethoprim/sulfamethoxazole yesterday for methicillin- resistant Staph aureus in the wound. I believe this was ordered by podiatry she has been using a Dakin's wet-to-dry dressing. She uses a cam boot and a knee scooter. She says she is religious with this 4/20; patient presents for follow-up. She continues to use Dakin's wet-to-dry dressings without issues. She states she would like to try the total contact cast that she has had this in the past. She currently denies signs of infection. 4/24; patient presents for follow-up. She would like the total contact cast placed today. She has been using Dakin's wet-to-dry dressings without issues. She denies signs of infection. 08-12-2021 patient presents today for obligatory first cast change. This was placed on Monday today is Wednesday. She seems to be doing quite well with using Hydrofera Blue and the overall appearance of the wound bed is excellent. We do need to replace the cast the good news is there is nothing that seems to be rubbing at this point which is awesome. 5/1; patient presents for follow-up. She has tolerated the total contact cast well. She has no issues or complaints today. 5/16; patient presents for follow-up. She states she missed her last week appointment due to having 2 appointments at the same time. She admits to knowing she is not supposed to have the contact cast for over 7 days. She currently denies signs of infection. She has no issues or complaints today. 5/23; patient presents for follow-up. She has no issues or complaints pertaining to the total contact cast. We have been using  Hydrofera Blue. She denies signs of infection. 5/30; patient presents for follow-up. She tolerated the total contact cast well over the past week. We have been using Hydrofera Blue under the cast. She has no issues or complaints today. 6/7; total contact cast Hydrofera Blue. Right plantar foot TMA site 6/15; patient presents for follow-up. She has had the total contact cast with Hydrofera Blue for the past week. She has no issues or complaints today. 6/20; patient presents for follow-up. She was without the total contact cast for the past week since the wound appeared closed. She has no  issues or complaints today. 6/27; patient presents for follow-up. She tolerated the total contact cast well. She has no issues or complaints today. The wound appears well-healing. 7/6; patient presents for follow-up. She tolerated the total contact cast well. We have been using Hydrofera Blue under the cast. Patient History Information obtained from Patient. Family History Cancer - Paternal Grandparents, Diabetes - Mother,Maternal Grandparents, Heart Disease - Mother,Paternal Grandparents,Maternal Grandparents,Father,Siblings, Hypertension - Mother,Father, Lung Disease - Mother, No family history of Hereditary Spherocytosis, Kidney Disease, Seizures, Stroke, Thyroid Problems, Tuberculosis. Social History Never smoker, Marital Status - Widowed, Alcohol Use - Never, Drug Use - Prior History, Caffeine Use - Daily - soda's. Medical History Eyes Denies history of Cataracts, Glaucoma, Optic Neuritis Ear/Nose/Mouth/Throat Denies history of Chronic sinus problems/congestion, Middle ear problems Hematologic/Lymphatic Denies history of Anemia, Hemophilia, Human Immunodeficiency Virus, Lymphedema, Sickle Cell Disease Respiratory Patient has history of Asthma, Sleep Apnea Denies history of Aspiration, Chronic Obstructive Pulmonary Disease (COPD), Pneumothorax, Tuberculosis Cardiovascular Patient has history of  Hypertension Denies history of Angina, Arrhythmia, Congestive Heart Failure, Coronary Artery Disease, Deep Vein Thrombosis, Hypotension, Myocardial Infarction, Peripheral Arterial Disease, Peripheral Venous Disease, Phlebitis, Vasculitis Gastrointestinal Denies history of Cirrhosis , Colitis, Crohnoos, Hepatitis A, Hepatitis B, Hepatitis C Endocrine Denies history of Type I Diabetes, Type II Diabetes Genitourinary Denies history of End Stage Renal Disease Immunological Denies history of Lupus Erythematosus, Raynaudoos, Scleroderma Integumentary (Skin) Denies history of History of Burn Musculoskeletal Patient has history of Osteoarthritis Denies history of Gout, Rheumatoid Arthritis, Osteomyelitis Neurologic Patient has history of Neuropathy, 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 - left 4th toe amp. - neck surgery. - spine surgery. - abdom. hysterectomy. - appendectomy. - lap. knee surgery. Medical A Surgical History Notes nd Constitutional Symptoms (General Health) torn right shoulder rotator cuff. Gastrointestinal Hx of GERD Psychiatric hx of bipolar 1 disorder, anxiety, depression. Objective Constitutional respirations regular, non-labored and within target range for patient.. Vitals Time Taken: 3:11 PM, Height: 64 in, Weight: 228 lbs, BMI: 39.1, Temperature: 97.8 F, Pulse: 69 bpm, Respiratory Rate: 18 breaths/min, Blood Pressure: 156/92 mmHg. Cardiovascular 2+ dorsalis pedis/posterior tibialis pulses. Psychiatric pleasant and cooperative. General Notes: Right foot: T the plantar aspect there is epithelization to the previous wound site. o Integumentary (Hair, Skin) Wound #2 status is Healed - Epithelialized.  Original cause of wound was Gradually Appeared. The date acquired was: 07/12/2021. The wound has been in treatment 13 weeks. The wound is located on the [place]. The wound measures 0cm length x 0cm width x 0cm depth; 0cm^2 area and 0cm^3 volume. Assessment Active Problems ICD-10 Non-pressure chronic ulcer of other part of right foot with fat layer exposed Type 2 diabetes mellitus with foot ulcer Other idiopathic peripheral autonomic neuropathy Patient has done well with the total contact cast and Hydrofera Blue. Her wound is healed. I recommended continuing to offload the area for the next 1 to 2 weeks with offloading foam donuts. She may follow-up as needed. Plan Discharge From [facility] Services: Discharge from [place] - No further follow up needed, call if any new issues. Non Wound Condition: Protect area with: - Apply foam corn pad over newly healed area for protection. 1. Discharge from clinic due to closed wound 2. Follow-up as needed 3. Foam donut for the next 1 to 2 weeks Electronic Signature(s) Signed: 10/22/2021 4:04:51  PM By: [name] Shan DO Entered By: [name] [name] on 10/22/2021 16:01:13 -------------------------------------------------------------------------------- HxROS Details Patient Name: Date of Service: [name] [name], [name] [name] 10/22/2021 3:30 PM Medical Record Number: [id number] Patient Account Number: [id number] Date of Birth/Sex: Treating RN: 1966/02/02 (56 y.o. [name] [name] Primary Care Provider: Jenna [name] Other Clinician: Referring Provider: Treating Provider/Extender: [name] [name] in Treatment: 13 Information Obtained From Patient Constitutional Symptoms (General Health) Medical History: Past Medical History Notes: torn right shoulder rotator cuff. Eyes Medical History: Negative for: Cataracts; Glaucoma; Optic Neuritis Ear/Nose/Mouth/Throat Medical History: Negative for: Chronic sinus  problems/congestion; Middle ear problems Hematologic/Lymphatic Medical History: Negative for: Anemia; Hemophilia; Human Immunodeficiency Virus; Lymphedema; Sickle Cell Disease Respiratory Medical History: Positive for: Asthma; Sleep Apnea Negative for: Aspiration; Chronic Obstructive Pulmonary Disease (COPD); Pneumothorax; Tuberculosis Cardiovascular Medical History: Positive for: Hypertension Negative for: Angina; Arrhythmia; Congestive Heart Failure; Coronary Artery Disease; Deep Vein Thrombosis; Hypotension; Myocardial Infarction; Peripheral Arterial Disease; Peripheral Venous Disease; Phlebitis; Vasculitis Gastrointestinal Medical History: Negative for: Cirrhosis ; Colitis; Crohns; Hepatitis A; Hepatitis B; Hepatitis C Past Medical History Notes: Hx of GERD Endocrine Medical History: Negative for: Type I Diabetes; Type II Diabetes Genitourinary Medical History: Negative for: End Stage Renal Disease Immunological Medical History: Negative for: Lupus Erythematosus; Raynauds; Scleroderma Integumentary (Skin) Medical History: Negative for: History of Burn Musculoskeletal Medical History: Positive for: Osteoarthritis Negative for: Gout; Rheumatoid Arthritis; Osteomyelitis Neurologic Medical History: Positive for: Neuropathy; Seizure Disorder - hx of seizures r/t tramadol Negative for: Dementia; Quadriplegia; Paraplegia Oncologic Medical History: Negative for: Received Chemotherapy; Received Radiation Psychiatric Medical History: Negative for: Anorexia/bulimia; Confinement Anxiety Past Medical History Notes: hx of bipolar 1 disorder, anxiety, depression. Immunizations Pneumococcal Vaccine: Received Pneumococcal Vaccination: Yes Received Pneumococcal Vaccination On or After 60th Birthday: No Immunization Notes: pt. doesn't remember last tetanus shot Implantable Devices None Hospitalization / Surgery History Type of Hospitalization/Surgery trasmet amp right  foot left 4th toe amp neck surgery spine surgery abdom. hysterectomy appendectomy lap. knee surgery Family and Social History Cancer: Yes - Paternal Grandparents; Diabetes: Yes - Mother,Maternal Grandparents; Heart Disease: Yes - Mother,Paternal Grandparents,Maternal Grandparents,Father,Siblings; Hereditary Spherocytosis: No; Hypertension: Yes - Mother,Father; Kidney Disease: No; Lung Disease: Yes - Mother; Seizures: No; Stroke: No; Thyroid Problems: No; Tuberculosis: No; Never smoker; Marital Status - Widowed; Alcohol Use: Never; Drug Use: Prior History; Caffeine Use: Daily - soda's; Financial Concerns: No; Food, Clothing or Shelter Needs: No; Support System Lacking: No; Transportation Concerns: No Electronic Signature(s) Signed: 10/22/2021 4:04:51 PM By: [name] Shan DO Signed: 10/22/2021 4:14:47 PM By: Lorrin Jackson Entered By: [name] [name] on 10/22/2021 15:59:56 -------------------------------------------------------------------------------- SuperBill Details Patient Name: Date of Service: [name] 10/22/2021 Medical Record Number: [id number] Patient Account Number: [id number] Date of Birth/Sex: Treating RN: 03/09/1966 (56 y.o. [name] [name] Primary Care Provider: Jenna [name] Other Clinician: Referring Provider: Treating Provider/Extender: [name] [name] in Treatment: 13 Diagnosis Coding ICD-10 Codes Code Description [phone] Non-pressure chronic ulcer of other part of right foot with fat layer exposed E11.621 Type 2 diabetes mellitus with foot ulcer G90.09 Other idiopathic peripheral autonomic neuropathy Facility Procedures CPT4 Code: [id number] Description: [id number] - WOUND CARE VISIT-LEV 2 EST PT Modifier: Quantity: 1 Physician Procedures : CPT4 Code Description Modifier [id number] - WC PHYS LEVEL 3 - EST PT ICD-10 Diagnosis Description L97.512 Non-pressure chronic ulcer of other part of right foot with fat layer exposed  E11.621 Type 2 diabetes mellitus with foot ulcer G90.09 Other  idiopathic peripheral autonomic neuropathy Quantity: 1 Electronic Signature(s) Signed: 10/22/2021 4:04:51 PM  By: [name] Shan DO Entered By: [name] [name] on 10/22/2021 16:01:46

## 2021-10-22 NOTE — Progress Notes (Signed)
SUMMIT, BORCHARDT ([id number]) Visit Report for 10/22/2021 Arrival Information Details Patient Name: Date of Service: [name] [name], [name] [name] 10/22/2021 3:30 PM Medical Record Number: [id number] Patient Account Number: [id number] Date of Birth/Sex: Treating RN: [birth year] (56 y.o. [name] [name] Primary Care Landi Biscardi: Jenna Luo Other Clinician: Referring Hutton Pellicane: Treating Butler Vegh/Extender: Michel Bickers in Treatment: 13 Visit Information History Since Last Visit Added or deleted any medications: No Patient Arrived: [name] [name] Any new allergies or adverse reactions: No Arrival Time: 15:10 Had a fall or experienced change in No Accompanied By: self activities of daily living that may affect Transfer Assistance: None risk of falls: Patient Identification Verified: Yes Signs or symptoms of abuse/neglect since last No Secondary Verification Process Completed: Yes visito Patient Requires Transmission-Based Precautions: No Hospitalized since last visit: No Patient Has Alerts: No Implantable device outside of the clinic No excluding cellular tissue based products placed in the center since last visit: Has Dressing in Place as Prescribed: Yes Has Footwear/Offloading in Place as Yes Prescribed: Right: Removable Cast Walker/Walking Boot Pain Present Now: No Electronic Signature(s) Signed: 10/22/2021 4:10:18 PM By: Erenest Blank Entered By: Erenest Blank on 10/22/2021 15:10:57 -------------------------------------------------------------------------------- Clinic Level of Care Assessment Details Patient Name: Date of Service: [name] [name], [name] [name] 10/22/2021 3:30 PM Medical Record Number: [id number] Patient Account Number: [id number] Date of Birth/Sex: Treating RN: [birth year] (56 y.o. [name] [name] Primary Care [name] Cogle: Jenna Luo Other Clinician: Referring Samentha Perham: Treating Pearly Bartosik/Extender: Michel Bickers in Treatment:  13 Clinic Level of Care Assessment Items TOOL 4 Quantity Score X- 1 0 Use when only an EandM is performed on FOLLOW-UP visit ASSESSMENTS - Nursing Assessment / Reassessment X- 1 10 Reassessment of Co-morbidities (includes updates in patient status) X- 1 5 Reassessment of Adherence to Treatment Plan ASSESSMENTS - Wound and Skin A ssessment / Reassessment '[]'$  - 0 Simple Wound Assessment / Reassessment - one wound '[]'$  - 0 Complex Wound Assessment / Reassessment - multiple wounds '[]'$  - 0 Dermatologic / Skin Assessment (not related to wound area) ASSESSMENTS - Focused Assessment '[]'$  - 0 Circumferential Edema Measurements - multi extremities '[]'$  - 0 Nutritional Assessment / Counseling / Intervention '[]'$  - 0 Lower Extremity Assessment (monofilament, tuning fork, pulses) '[]'$  - 0 Peripheral Arterial Disease Assessment (using hand held doppler) ASSESSMENTS - Ostomy and/or Continence Assessment and Care '[]'$  - 0 Incontinence Assessment and Management '[]'$  - 0 Ostomy Care Assessment and Management (repouching, etc.) PROCESS - Coordination of Care '[]'$  - 0 Simple Patient / Family Education for ongoing care X- 1 20 Complex (extensive) Patient / Family Education for ongoing care '[]'$  - 0 Staff obtains Programmer, systems, Records, T Results / Process Orders est '[]'$  - 0 Staff telephones HHA, Nursing Homes / Clarify orders / etc '[]'$  - 0 Routine Transfer to another Facility (non-emergent condition) '[]'$  - 0 Routine Hospital Admission (non-emergent condition) '[]'$  - 0 New Admissions / Biomedical engineer / Ordering NPWT Apligraf, etc. , '[]'$  - 0 Emergency Hospital Admission (emergent condition) '[]'$  - 0 Simple Discharge Coordination '[]'$  - 0 Complex (extensive) Discharge Coordination PROCESS - Special Needs '[]'$  - 0 Pediatric / Minor Patient Management '[]'$  - 0 Isolation Patient Management '[]'$  - 0 Hearing / Language / Visual special needs '[]'$  - 0 Assessment of Community assistance (transportation, D/C planning,  etc.) '[]'$  - 0 Additional assistance / Altered mentation '[]'$  - 0 Support Surface(s) Assessment (bed, cushion, seat, etc.) INTERVENTIONS - Wound Cleansing / Measurement '[]'$  - 0 Simple Wound Cleansing - one wound '[]'$  - 0 Complex  Wound Cleansing - multiple wounds X- 1 5 Wound Imaging (photographs - any number of wounds) '[]'$  - 0 Wound Tracing (instead of photographs) '[]'$  - 0 Simple Wound Measurement - one wound '[]'$  - 0 Complex Wound Measurement - multiple wounds INTERVENTIONS - Wound Dressings X - Small Wound Dressing one or multiple wounds 1 10 '[]'$  - 0 Medium Wound Dressing one or multiple wounds '[]'$  - 0 Large Wound Dressing one or multiple wounds '[]'$  - 0 Application of Medications - topical '[]'$  - 0 Application of Medications - injection INTERVENTIONS - Miscellaneous '[]'$  - 0 External ear exam '[]'$  - 0 Specimen Collection (cultures, biopsies, blood, body fluids, etc.) '[]'$  - 0 Specimen(s) / Culture(s) sent or taken to Lab for analysis '[]'$  - 0 Patient Transfer (multiple staff / Civil Service fast streamer / Similar devices) '[]'$  - 0 Simple Staple / Suture removal (25 or less) '[]'$  - 0 Complex Staple / Suture removal (26 or more) '[]'$  - 0 Hypo / Hyperglycemic Management (close monitor of Blood Glucose) '[]'$  - 0 Ankle / Brachial Index (ABI) - do not check if billed separately X- 1 5 Vital Signs Has the patient been seen at the hospital within the last three years: Yes Total Score: 55 Level Of Care: New/Established - Level 2 Electronic Signature(s) Signed: 10/22/2021 4:14:47 PM By: Lorrin Jackson Entered By: Lorrin Jackson on 10/22/2021 15:54:37 -------------------------------------------------------------------------------- Encounter Discharge Information Details Patient Name: Date of Service: [name] Shutter D. 10/22/2021 3:30 PM Medical Record Number: [id number] Patient Account Number: [id number] Date of Birth/Sex: Treating RN: January 01, 1966 (55 y.o. [name] [name] Primary Care Forestine Macho: Jenna Luo Other  Clinician: Referring Marie Chow: Treating Talise Sligh/Extender: Michel Bickers in Treatment: 13 Encounter Discharge Information Items Discharge Condition: Stable Ambulatory Status: Cane Discharge Destination: Home Transportation: Private Auto Schedule Follow-up Appointment: No Clinical Summary of Care: Provided on 10/22/2021 Form Type Recipient Paper Patient Patient Electronic Signature(s) Signed: 10/22/2021 4:14:47 PM By: Lorrin Jackson Entered By: Lorrin Jackson on 10/22/2021 15:57:23 -------------------------------------------------------------------------------- Lower Extremity Assessment Details Patient Name: Date of Service: [name] Shutter D. 10/22/2021 3:30 PM Medical Record Number: [id number] Patient Account Number: [id number] Date of Birth/Sex: Treating RN: 06/19/1965 (56 y.o. [name] [name] Primary Care Carlea Badour: Jenna Luo Other Clinician: Referring Shomari Matusik: Treating Rayne Cowdrey/Extender: Michel Bickers in Treatment: 13 Edema Assessment Assessed: Shirlyn Goltz: No] Patrice Paradise: No] Edema: [Left: N] [Right: o] Calf Left: Right: Point of Measurement: 26 cm From Medial Instep 40.1 cm Ankle Left: Right: Point of Measurement: 10 cm From Medial Instep 25 cm Electronic Signature(s) Signed: 10/22/2021 4:10:18 PM By: Erenest Blank Signed: 10/22/2021 4:14:47 PM By: Lorrin Jackson Entered By: Erenest Blank on 10/22/2021 15:21:59 -------------------------------------------------------------------------------- Multi Wound Chart Details Patient Name: Date of Service: [name] Shutter D. 10/22/2021 3:30 PM Medical Record Number: [id number] Patient Account Number: [id number] Date of Birth/Sex: Treating RN: 11/10/1965 (56 y.o. [name] [name] Primary Care Miho Monda: Jenna Luo Other Clinician: Referring Sarahmarie Leavey: Treating Kyaira Trantham/Extender: Michel Bickers in Treatment: 13 Vital Signs Height(in): 73 Pulse(bpm):  33 Weight(lbs): 228 Blood Pressure(mmHg): 156/92 Body Mass Index(BMI): 39.1 Temperature(F): 97.8 Respiratory Rate(breaths/min): 18 Photos: [N/A:N/A] Right, Plantar Foot N/A N/A Wound Location: Gradually Appeared N/A N/A Wounding Event: Neuropathic Ulcer-Non Diabetic N/A N/A Primary Etiology: Asthma, Sleep Apnea, Hypertension, N/A N/A Comorbid History: Osteoarthritis, Neuropathy, Seizure Disorder 07/12/2021 N/A N/A Date Acquired: 13 N/A N/A Weeks of Treatment: Healed - Epithelialized N/A N/A Wound Status: No N/A N/A Wound Recurrence: 0x0x0 N/A N/A Measurements L x W x D (cm) 0 N/A N/A A (cm) :  rea 0 N/A N/A Volume (cm) : 100.00% N/A N/A % Reduction in Area: 100.00% N/A N/A % Reduction in Volume: Full Thickness Without Exposed N/A N/A Classification: Support Structures Treatment Notes Electronic Signature(s) Signed: 10/22/2021 4:04:51 PM By: Kalman Shan DO Signed: 10/22/2021 4:14:47 PM By: Lorrin Jackson Entered By: Kalman Shan on 10/22/2021 15:58:49 -------------------------------------------------------------------------------- Multi-Disciplinary Care Plan Details Patient Name: Date of Service: [name] Shutter D. 10/22/2021 3:30 PM Medical Record Number: [id number] Patient Account Number: [id number] Date of Birth/Sex: Treating RN: April 02, 1966 (56 y.o. [name] [name] Primary Care Hubbert Landrigan: Jenna Luo Other Clinician: Referring Desjuan Stearns: Treating Miel Wisener/Extender: Michel Bickers in Treatment: 13 Active Inactive Electronic Signature(s) Signed: 10/22/2021 4:14:47 PM By: Lorrin Jackson Entered By: Lorrin Jackson on 10/22/2021 15:53:45 -------------------------------------------------------------------------------- Pain Assessment Details Patient Name: Date of Service: [name] [name] 10/22/2021 3:30 PM Medical Record Number: [id number] Patient Account Number: [id number] Date of Birth/Sex: Treating RN: [birth year] (56  y.o. [name] [name] Primary Care Euline Kimbler: Jenna Luo Other Clinician: Referring Rhianne Soman: Treating Taisa Deloria/Extender: Michel Bickers in Treatment: 13 Active Problems Location of Pain Severity and Description of Pain Patient Has Paino No Site Locations Pain Management and Medication Current Pain Management: Electronic Signature(s) Signed: 10/22/2021 4:10:18 PM By: Erenest Blank Signed: 10/22/2021 4:14:47 PM By: Lorrin Jackson Entered By: Erenest Blank on 10/22/2021 15:11:29 -------------------------------------------------------------------------------- Patient/Caregiver Education Details Patient Name: Date of Service: [name] [name] 7/6/2023andnbsp3:30 PM Medical Record Number: [id number] Patient Account Number: [id number] Date of Birth/Gender: Treating RN: July 21, 1965 (56 y.o. [name] [name] Primary Care Physician: Jenna Luo Other Clinician: Referring Physician: Treating Physician/Extender: Michel Bickers in Treatment: 13 Education Assessment Education Provided To: Patient Education Topics Provided Notes Healed, discharged from center. Electronic Signature(s) Signed: 10/22/2021 4:14:47 PM By: Lorrin Jackson Entered By: Lorrin Jackson on 10/22/2021 15:54:07 -------------------------------------------------------------------------------- Wound Assessment Details Patient Name: Date of Service: [name] [name] 10/22/2021 3:30 PM Medical Record Number: [id number] Patient Account Number: [id number] Date of Birth/Sex: Treating RN: 11-17-1965 (56 y.o. [name] [name] Primary Care Tyson Masin: Jenna Luo Other Clinician: Referring Thornton Dohrmann: Treating Andrius Andrepont/Extender: Michel Bickers in Treatment: 13 Wound Status Wound Number: 2 Primary Neuropathic Ulcer-Non Diabetic Etiology: Wound Location: Right, Plantar Foot Wound Healed - Epithelialized Wounding Event: Gradually  Appeared Status: Date Acquired: 07/12/2021 Comorbid Asthma, Sleep Apnea, Hypertension, Osteoarthritis, Weeks Of Treatment: 13 History: Neuropathy, Seizure Disorder Clustered Wound: No Photos Wound Measurements Length: (cm) 0 Width: (cm) 0 Depth: (cm) 0 Area: (cm) 0 Volume: (cm) 0 Wound Description Classification: Full Thickness Without Exposed Support Structur es % Reduction in Area: 100% % Reduction in Volume: 100% Electronic Signature(s) Signed: 10/22/2021 4:14:47 PM By: Lorrin Jackson Entered By: Lorrin Jackson on 10/22/2021 15:51:59 -------------------------------------------------------------------------------- Vitals Details Patient Name: Date of Service: [name] Shutter D. 10/22/2021 3:30 PM Medical Record Number: [id number] Patient Account Number: [id number] Date of Birth/Sex: Treating RN: April 30, 1965 (56 y.o. [name] [name] Primary Care Ramina Hulet: Jenna Luo Other Clinician: Referring Brianah Hopson: Treating Cherron Blitzer/Extender: Michel Bickers in Treatment: 13 Vital Signs Time Taken: 15:11 Temperature (F): 97.8 Height (in): 64 Pulse (bpm): 69 Weight (lbs): 228 Respiratory Rate (breaths/min): 18 Body Mass Index (BMI): 39.1 Blood Pressure (mmHg): 156/92 Reference Range: 80 - 120 mg / dl Electronic Signature(s) Signed: 10/22/2021 4:10:18 PM By: Erenest Blank Entered By: Erenest Blank on 10/22/2021 15:11:23

## 2021-10-23 ENCOUNTER — Other Ambulatory Visit: Payer: Self-pay | Admitting: Cardiovascular Disease

## 2021-10-27 ENCOUNTER — Other Ambulatory Visit: Payer: Self-pay | Admitting: Family Medicine

## 2021-10-28 DIAGNOSIS — M961 Postlaminectomy syndrome, not elsewhere classified: Secondary | ICD-10-CM | POA: Diagnosis not present

## 2021-10-28 DIAGNOSIS — Z79891 Long term (current) use of opiate analgesic: Secondary | ICD-10-CM | POA: Diagnosis not present

## 2021-10-28 DIAGNOSIS — M5417 Radiculopathy, lumbosacral region: Secondary | ICD-10-CM | POA: Diagnosis not present

## 2021-10-28 DIAGNOSIS — G894 Chronic pain syndrome: Secondary | ICD-10-CM | POA: Diagnosis not present

## 2021-10-28 NOTE — Telephone Encounter (Signed)
Requested Prescriptions  Pending Prescriptions Disp Refills  . solifenacin (VESICARE) 10 MG tablet [Pharmacy Med Name: solifenacin 10 mg tablet] 90 tablet 0    Sig: TAKE ONE TABLET BY MOUTH ONCE DAILY     Urology:  Bladder Agents 2 Passed - 10/27/2021 11:11 AM      Passed - Cr in normal range and within 360 days    Creat  Date Value Ref Range Status  09/18/2021 0.78 0.50 - 1.03 mg/dL Final         Passed - ALT in normal range and within 360 days    ALT  Date Value Ref Range Status  09/18/2021 11 6 - 29 U/L Final         Passed - AST in normal range and within 360 days    AST  Date Value Ref Range Status  09/18/2021 12 10 - 35 U/L Final         Passed - Valid encounter within last 12 months    Recent Outpatient Visits          1 month ago Benign essential HTN   [place] [facility], Cammie Mcgee, MD   3 months ago Infectious diarrhea   [place] Dennard Schaumann, Cammie Mcgee, MD   5 months ago Blood in stool, frank   [place] [facility], Cammie Mcgee, MD   11 months ago Ulcer of right foot with fat layer exposed ([place])   [place] Pickard, Cammie Mcgee, MD   1 year ago Upper respiratory tract infection, unspecified type   [place] Eulogio Bear, NP             . Rolan Lipa 290 MCG CAPS capsule [Pharmacy Med Name: Linzess 290 mcg capsule] 90 capsule 0    Sig: [place]     Gastroenterology: Irritable Bowel Syndrome Passed - 10/27/2021 11:11 AM      Passed - Valid encounter within last 12 months    Recent Outpatient Visits          1 month ago Benign essential HTN   [place], Cammie Mcgee, MD   3 months ago Infectious diarrhea   [place] [name], MD   5 months ago Blood in stool, frank   [place] Pickard, Cammie Mcgee, MD   11 months ago Ulcer of right foot with fat layer exposed ([place])   [place] [facility], Cammie Mcgee, MD   1 year ago Upper respiratory tract infection, unspecified type   [place] Eulogio Bear, NP

## 2021-10-30 ENCOUNTER — Ambulatory Visit: Payer: Medicare Other | Admitting: Podiatry

## 2021-11-05 ENCOUNTER — Encounter (HOSPITAL_BASED_OUTPATIENT_CLINIC_OR_DEPARTMENT_OTHER): Payer: Medicare Other | Admitting: Internal Medicine

## 2021-11-05 DIAGNOSIS — L97512 Non-pressure chronic ulcer of other part of right foot with fat layer exposed: Secondary | ICD-10-CM

## 2021-11-05 DIAGNOSIS — G9009 Other idiopathic peripheral autonomic neuropathy: Secondary | ICD-10-CM | POA: Diagnosis not present

## 2021-11-05 DIAGNOSIS — E11621 Type 2 diabetes mellitus with foot ulcer: Secondary | ICD-10-CM | POA: Diagnosis not present

## 2021-11-05 NOTE — Progress Notes (Signed)
[name] [name] ([id number]) Visit Report for 11/05/2021 Arrival Information Details Patient Name: Date of Service: [name] [name] 11/05/2021 11:00 A M Medical Record Number: [id number] Patient Account Number: [id number] Date of Birth/Sex: Treating RN: [birth year] (56 y.o. [name] Primary Care Jathniel Smeltzer: Jenna Luo Other Clinician: Referring Mckala Pantaleon: Treating Shaquela Weichert/Extender: Michel Bickers in Treatment: 15 Visit Information History Since Last Visit Added or deleted any medications: No Patient Arrived: [name] [name] Any new allergies or adverse reactions: No Arrival Time: 11:40 Had a fall or experienced change in No Transfer Assistance: None activities of daily living that may affect Patient Identification Verified: Yes risk of falls: Secondary Verification Process Completed: Yes Signs or symptoms of abuse/neglect since last No Patient Requires Transmission-Based Precautions: No visito Patient Has Alerts: No Hospitalized since last visit: No Implantable device outside of the clinic No excluding cellular tissue based products placed in the center since last visit: Has Footwear/Offloading in Place as Yes Prescribed: Right: Removable Cast Walker/Walking Boot Pain Present Now: No Electronic Signature(s) Signed: 11/05/2021 2:42:53 PM By: Lorrin Jackson Entered By: Lorrin Jackson on 11/05/2021 11:41:17 -------------------------------------------------------------------------------- Encounter Discharge Information Details Patient Name: Date of Service: [name] Shutter D. 11/05/2021 11:00 A M Medical Record Number: [id number] Patient Account Number: [id number] Date of Birth/Sex: Treating RN: [birth year] (56 y.o. [name] Primary Care Koal Eslinger: Jenna Luo Other Clinician: Referring Halley Kincer: Treating Kaesha Kirsch/Extender: Michel Bickers in Treatment: 15 Encounter Discharge Information Items Discharge Condition:  Stable Ambulatory Status: Cane Discharge Destination: Home Transportation: Private Auto Schedule Follow-up Appointment: Yes Clinical Summary of Care: Provided on 11/05/2021 Form Type Recipient Paper Patient Patient Electronic Signature(s) Signed: 11/05/2021 2:42:53 PM By: Lorrin Jackson Entered By: Lorrin Jackson on 11/05/2021 12:11:36 -------------------------------------------------------------------------------- Lower Extremity Assessment Details Patient Name: Date of Service: [name] Shutter D. 11/05/2021 11:00 A M Medical Record Number: [id number] Patient Account Number: [id number] Date of Birth/Sex: Treating RN: May 25, 1965 (56 y.o. [name] Primary Care Shalanda Brogden: Jenna Luo Other Clinician: Referring Demitris Pokorny: Treating Marty Sadlowski/Extender: Michel Bickers in Treatment: 15 Edema Assessment Assessed: Shirlyn Goltz: No] Patrice Paradise: Yes] Edema: [Left: N] [Right: o] Calf Left: Right: Point of Measurement: 26 cm From Medial Instep 40 cm Ankle Left: Right: Point of Measurement: 10 cm From Medial Instep 25 cm Vascular Assessment Pulses: Dorsalis Pedis Palpable: [Right:Yes] Electronic Signature(s) Signed: 11/05/2021 2:42:53 PM By: Lorrin Jackson Entered By: Lorrin Jackson on 11/05/2021 11:42:08 -------------------------------------------------------------------------------- Multi Wound Chart Details Patient Name: Date of Service: [name] Shutter D. 11/05/2021 11:00 A M Medical Record Number: [id number] Patient Account Number: [id number] Date of Birth/Sex: Treating RN: 1965-09-30 (56 y.o. 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] [N/A:N/A] Primary Etiology: [3:Asthma, Sleep Apnea, Hypertension, N/A] Comorbid History: [3:Osteoarthritis, Neuropathy, Seizure Disorder 11/05/2021] [N/A:N/A] Date Acquired: [3:0] [N/A:N/A] Weeks of Treatment: [3:Open] [N/A:N/A] Wound Status: [3:No] [N/A:N/A] Wound Recurrence: [3:0.3x0.4x0.1] [N/A:N/A] Measurements L x W x D (cm) [3:0.094] [N/A:N/A] A (cm) : rea [3:0.009] [N/A:N/A] Volume (cm) : [3:0.00%] [N/A:N/A] % Reduction in Area: [3:0.00%] [N/A:N/A] % Reduction in Volume: [3:Full Thickness Without Exposed] [N/A:N/A] Classification: [3:Support Structures Medium] [N/A:N/A] Exudate Amount: [3:Sanguinous] [N/A:N/A] Exudate Type: [3:red] [N/A:N/A] Exudate Color: [3:Distinct, outline attached] [N/A:N/A] Wound Margin: [3:Large (67-100%)] [N/A:N/A] Granulation Amount: [3:Red] [N/A:N/A] Granulation Quality: [3:None  Present (0%)] [N/A:N/A] Necrotic Amount: [3:Fat Layer (Subcutaneous Tissue): Yes N/A] Exposed Structures: [3:Fascia: No Tendon: No Muscle: No Joint: No Bone: No None] [N/A:N/A] Epithelialization: [3:T Contact Cast otal] [N/A:N/A] Treatment Notes Wound #3 (Foot) Wound Laterality: Plantar, Right Cleanser Soap and Water Discharge Instruction: May shower and wash wound with dial antibacterial soap and water prior to dressing change. Wound Cleanser Discharge Instruction: Cleanse the wound with wound cleanser prior to applying a clean dressing using gauze sponges, not tissue or cotton balls. Peri-Wound Care Topical Primary Dressing Hydrofera Blue Ready Foam, 2.5 x2.5 in Discharge Instruction: Apply to wound bed as instructed Secondary Dressing Woven Gauze Sponges 2x2 in Discharge Instruction: Apply over primary dressing as directed. Secured With 5M Medipore H Soft Cloth Surgical T ape, 4 x 10 (in/yd) Discharge Instruction: Secure with tape as  directed. Compression Wrap Compression Stockings Add-Ons Electronic Signature(s) Signed: 11/05/2021 2:00:38 PM By: Kalman Shan DO Entered By: Kalman Shan on 11/05/2021 12:43:58 -------------------------------------------------------------------------------- Multi-Disciplinary Care Plan Details Patient Name: Date of Service: [name] Shutter D. 11/05/2021 11:00 A M Medical Record Number: [id number] Patient Account Number: [id number] Date of Birth/Sex: Treating RN: 1965/06/04 (56 y.o. [name] Primary Care Deborh Pense: Jenna Luo Other Clinician: Referring Maymuna Detzel: Treating Welma Mccombs/Extender: Michel Bickers in Treatment: 15 Active Inactive Wound/Skin Impairment Nursing Diagnoses: Impaired tissue integrity Goals: Patient/caregiver will verbalize understanding of skin care regimen Date Initiated: 07/23/2021 Date Inactivated: 10/22/2021 Target Resolution Date: 11/03/2021 Goal Status: Met Ulcer/skin breakdown will have a volume reduction of 30% by week 4 Date Initiated: 07/23/2021 Target Resolution Date: 12/03/2021 Goal Status: Active Ulcer/skin breakdown will have a volume reduction of 50% by week 8 Date Initiated: 09/08/2021 Date Inactivated: 10/06/2021 Target Resolution Date: 10/05/2021 Goal Status: Met Interventions: Assess patient/caregiver ability to obtain necessary supplies Assess patient/caregiver ability to perform ulcer/skin care regimen upon admission and as needed Assess ulceration(s) every visit Provide education on ulcer and skin care Treatment Activities: Topical wound management initiated : 07/23/2021 Notes: 09/08/21: Wound greater than 30% volume reduction. Wound care regimen continues. 10/22/21: Healed Electronic Signature(s) Signed: 11/05/2021 2:42:53 PM By: Lorrin Jackson Entered By: Lorrin Jackson on 11/05/2021 11:46:34 -------------------------------------------------------------------------------- Pain Assessment  Details Patient Name: Date of Service: [name] Shutter D. 11/05/2021 11:00 A M Medical Record Number: [id number] Patient Account Number: [id number] Date of Birth/Sex: Treating RN: 1965/05/15 (56 y.o. [name] Primary Care Joyice Magda: Jenna Luo Other Clinician: Referring Shaka Cardin: Treating Hulda Reddix/Extender: Michel Bickers in Treatment: 15 Active Problems Location of Pain Severity and Description of Pain Patient Has Paino No Site Locations Pain Management and Medication Current Pain Management: Electronic Signature(s) Signed: 11/05/2021 2:42:53 PM By: Lorrin Jackson Entered By: Lorrin Jackson on 11/05/2021 11:41:40 -------------------------------------------------------------------------------- Patient/Caregiver Education Details Patient Name: Date of Service: Gowdy, Gerilyn D. 7/20/2023andnbsp11:00 [place] Record Number: [id number] Patient Account Number: [id number] Date of Birth/Gender: Treating RN: 1965-06-08 (56 y.o. [name] Primary Care Physician: Jenna Luo Other Clinician: Referring Physician: Treating Physician/Extender: Michel Bickers in Treatment: 15 Education Assessment Education Provided To: Patient Education Topics Provided Offloading: Methods: Explain/Verbal, Printed Responses: State content correctly Wound/Skin Impairment: Methods: Explain/Verbal, Printed Responses: State content correctly Electronic Signature(s) Signed: 11/05/2021 2:42:53 PM By: Lorrin Jackson Entered By: Lorrin Jackson on 11/05/2021 11:46:52 -------------------------------------------------------------------------------- Wound Assessment Details Patient Name: Date of Service: [name] Shutter D. 11/05/2021 11:00 A M Medical Record Number: [id number] Patient Account Number: [id number] Date of Birth/Sex: Treating RN: 1965/09/25 (56 y.o. [name] Primary Care Florice Hindle: Jenna Luo Other  Clinician: Referring  Doyt Castellana: Treating Shawnna Pancake/Extender: Michel Bickers in Treatment: 15 Wound Status Wound Number: 3 Primary Neuropathic Ulcer-Non Diabetic Etiology: Wound Location: Right, Plantar Foot Wound Open Wounding Event: Gradually Appeared Status: Date Acquired: 11/05/2021 Comorbid Asthma, Sleep Apnea, Hypertension, Osteoarthritis, Weeks Of Treatment: 0 History: Neuropathy, Seizure Disorder Clustered Wound: No Photos Wound Measurements Length: (cm) 0.3 Width: (cm) 0.4 Depth: (cm) 0.1 Area: (cm) 0.094 Volume: (cm) 0.009 % Reduction in Area: 0% % Reduction in Volume: 0% Epithelialization: None Tunneling: No Undermining: No Wound Description Classification: Full Thickness Without Exposed Support Structures Wound Margin: Distinct, outline attached Exudate Amount: Medium Exudate Type: Sanguinous Exudate Color: red Foul Odor After Cleansing: No Slough/Fibrino No Wound Bed Granulation Amount: Large (67-100%) Exposed Structure Granulation Quality: Red Fascia Exposed: No Necrotic Amount: None Present (0%) Fat Layer (Subcutaneous Tissue) Exposed: Yes Tendon Exposed: No Muscle Exposed: No Joint Exposed: No Bone Exposed: No Treatment Notes Wound #3 (Foot) Wound Laterality: Plantar, Right Cleanser Soap and Water Discharge Instruction: May shower and wash wound with dial antibacterial soap and water prior to dressing change. Wound Cleanser Discharge Instruction: Cleanse the wound with wound cleanser prior to applying a clean dressing using gauze sponges, not tissue or cotton balls. Peri-Wound Care Topical Primary Dressing Hydrofera Blue Ready Foam, 2.5 x2.5 in Discharge Instruction: Apply to wound bed as instructed Secondary Dressing Woven Gauze Sponges 2x2 in Discharge Instruction: Apply over primary dressing as directed. Secured With 106M Medipore H Soft Cloth Surgical T ape, 4 x 10 (in/yd) Discharge Instruction: Secure with tape  as directed. Compression Wrap Compression Stockings Add-Ons Electronic Signature(s) Signed: 11/05/2021 2:42:53 PM By: Lorrin Jackson Entered By: Lorrin Jackson on 11/05/2021 11:49:08 -------------------------------------------------------------------------------- Vitals Details Patient Name: Date of Service: [name] Shutter D. 11/05/2021 11:00 A M Medical Record Number: [id number] Patient Account Number: [id number] Date of Birth/Sex: Treating RN: Aug 11, 1965 (56 y.o. [name] Primary Care Chamia Schmutz: Jenna Luo Other Clinician: Referring Trinidi Toppins: Treating Sanela Evola/Extender: Michel Bickers in Treatment: 15 Vital Signs Time Taken: 11:41 Temperature (F): 97.7 Height (in): 64 Pulse (bpm): 84 Weight (lbs): 228 Respiratory Rate (breaths/min): 18 Body Mass Index (BMI): 39.1 Blood Pressure (mmHg): 136/86 Reference Range: 80 - 120 mg / dl Electronic Signature(s) Signed: 11/05/2021 2:42:53 PM By: Lorrin Jackson Entered By: Lorrin Jackson on 11/05/2021 11:41:33

## 2021-11-05 NOTE — Progress Notes (Signed)
[name] [name], PO ([id number]) Visit Report for 11/05/2021 Chief Complaint Document Details Patient Name: Date of Service: [name] [name], [name] [name] 11/05/2021 11:00 A M Medical Record Number: [id number] Patient Account Number: [id number] Date of Birth/Sex: Treating RN: 08-15-1965 (56 y.o. F) Primary Care Provider: Jenna Luo Other Clinician: Referring Provider: Treating Provider/Extender: Michel Bickers in Treatment: 15 Information Obtained from: Patient Chief Complaint 06/03/2019; patient is here for review of a wound on her plantar foot transmetatarsal amputation site at roughly the third met head 07/23/2021; patient presents for right plantar foot wound Electronic Signature(s) Signed: 11/05/2021 2:00:38 PM By: Kalman Shan DO Entered By: Kalman Shan on 11/05/2021 12:44:09 -------------------------------------------------------------------------------- HPI Details Patient Name: Date of Service: [name] [name]. 11/05/2021 11:00 A M Medical Record Number: [id number] Patient Account Number: [id number] Date of Birth/Sex: Treating RN: 1966-02-23 (56 y.o. F) Primary Care Provider: Jenna Luo Other Clinician: Referring Provider: Treating Provider/Extender: Michel Bickers in Treatment: 15 History of Present Illness HPI Description: ADMISSION 06/02/2020 This is a 56 year old woman who has had problems with wounds on her predominantly plantar right foot for quite a period of time. 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 More recently she has had an area developed in the right mid foot at roughly the third metatarsal head. She says this started as a blister of November. Its not been closing. More recently she has been using Silvadene cream she has a cam boot to offload. I note she walks with a cane The patient has had  arterial studies done in 2019 at which time her ABI in the right was 1.28 with triphasic waveforms noncompressible on the left with triphasic waveform. Her ABI in our clinic today was 1.3 on the right. 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 She had a foot gastrocnemius recession sometime in the fall 2021 2/21; patient arrives today with a wound measuring slightly smaller 3 x 3 mm however there is circumferential area of erythema around this. She does not have any complaints 2/28; plantar wound on the right first transmetatarsal amputation site. Small oval-shaped wound raised skin around the edges. We have been using Hydrofera Blue. She does not have arterial issues 06/23/2020 upon evaluation today patient appears to be doing decently well. With regard to her wound. There is little bit of slough and some hyper granulation. I do not feel like the PolyMem did too well for her. In fact she may do better with some Hydrofera Blue. I know we just switch from that but I feel like that may be a better way to continue currently. 3/15; this is a patient with a wound on the plantar right transmetatarsal amputation site probably the second metatarsal head not 1 July as stated previously we have been using polymen switch to [facility] last week. Wound looks about the same to me. We are going to start her on a total contact cast today we have the boot to fit 3/18; first total contact cast change she tolerated this well 3/31; she comes back in with out total contact cast on for 10 days. In spite of this the small wound over the third metatarsal head of her right transmetatarsal amputation site almost looks the same slightly hyper granulated. Circumference is not well adhered.  We have been using Hydrofera Blue 4/7 she comes back in in her total contact cast. The wound is smaller epithelializing  and looks like it on its way to closing. We have been using Hydrofera Blue over the wound area She has a right transmetatarsal amputation. She tells [place] that she has been to [facility] in the past and they did a modified shoe for her however it did not keep this area epithelialized. She is going to bring that in next week for [place] to look at. It does not sound however that she was really offloading or padding this area at all 4/14; total contact cast. I changed her to calcium alginate today. I think most of the small area is epithelialized although there is clearly not complete epithelialization and even the area that is epithelialized and the small wound is very tiny She shows me her shoe with the insert. It looks as though the insert probably was too large for her transmet it foot allowing it to move back and forward. I can see where the friction was in the blister she describes forming. I have asked her to take this back to [facility] to see if there is anything they can do 4/21; patient presents for her 1 week follow-up. She has been treated with a total contact cast weekly. She has no complaints today. She denies any fever/chills. 4/26; patient presents for 1 week follow-up in total contact cast change. Patient is having knee pain to her right knee due to chronic osteoarthritis. She is hoping to be able to wear her brace over the cast. 5/10; the wound is closed over however still looks vulnerable. 5/17; the wound remains closed surface looks better. She has a previous right transmet. She is going to convert this into a sneaker. She had a forefoot prosthesis for the foot but not sure that that are too much good she is supposed to have taken aback from modification however she can do that now. 6/8; patient returns to our clinic for reopening of the plantar right foot transmetatarsal amputation site. She has slight warmth and erythema to the area. She would like to try  conservative wound care and not do the total contact cast that was she was doing before at this time 10/01/2020 upon evaluation today patient appears to be doing okay in regard to her foot ulcer. Fortunately there does not appear to be any signs of active infection which is great news and overall very pleased with where things stand today. The wound does appear to be doing well and overall appearance wise is significantly improved compared to last week in my opinion based on my review of her pictures today. Overall the patient tells me that she is not having any significant pain which is great news as well. 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 This is a reoccurring wound for the patient. She has been following with Dr. Earleen Newport for this issue. She had a wound culture that grew Staph aureus sensitive to Bactrim and she is currently on this. She has been using Silvadene to the wound bed. She has a cam walker boot and an offloading scooter that she uses. She currently denies signs of infection. 4/13; this is a patient that is a  type II diabetic complicated by peripheral neuropathy. She has had a previous transmetatarsal amputation of the right foot. She has a new wound on the plantar right foot between the first and second met heads probably close to the lateral part of the first met head. Generally small clean wound however from 7-9 o'clock there is a probing area close to bone. She finished a course of trimethoprim/sulfamethoxazole yesterday for methicillin- resistant Staph aureus in the wound. I believe this was ordered by podiatry she has been using a Dakin's wet-to-dry dressing. She uses a cam boot and a knee scooter. She says she is religious with this 4/20; patient presents  for follow-up. She continues to use Dakin's wet-to-dry dressings without issues. She states she would like to try the total contact cast that she has had this in the past. She currently denies signs of infection. 4/24; patient presents for follow-up. She would like the total contact cast placed today. She has been using Dakin's wet-to-dry dressings without issues. She denies signs of infection. 08-12-2021 patient presents today for obligatory first cast change. This was placed on Monday today is Wednesday. She seems to be doing quite well with using Hydrofera Blue and the overall appearance of the wound bed is excellent. We do need to replace the cast the good news is there is nothing that seems to be rubbing at this point which is awesome. 5/1; patient presents for follow-up. She has tolerated the total contact cast well. She has no issues or complaints today. 5/16; patient presents for follow-up. She states she missed her last week appointment due to having 2 appointments at the same time. She admits to knowing she is not supposed to have the contact cast for over 7 days. She currently denies signs of infection. She has no issues or complaints today. 5/23; patient presents for follow-up. She has no issues or complaints pertaining to the total contact cast. We have been using Hydrofera Blue. She denies signs of infection. 5/30; patient presents for follow-up. She tolerated the total contact cast well over the past week. We have been using Hydrofera Blue under the cast. She has no issues or complaints today. 6/7; total contact cast Hydrofera Blue. Right plantar foot TMA site 6/15; patient presents for follow-up. She has had the total contact cast with Hydrofera Blue for the past week. She has no issues or complaints today. 6/20; patient presents for follow-up. She was without the total contact cast for the past week since the wound appeared closed. She has no issues or complaints today. 6/27;  patient presents for follow-up. She tolerated the total contact cast well. She has no issues or complaints today. The wound appears well-healing. 7/6; patient presents for follow-up. She tolerated the total contact cast well. We have been using Hydrofera Blue under the cast. 7/20; patient presents with reopening of her right plantar foot wound. She states this happened about 1 week ago. She has been keeping the area covered. She denies signs of infection. Electronic Signature(s) Signed: 11/05/2021 2:00:38 PM By: Kalman Shan DO Entered By: Kalman Shan on 11/05/2021 12:44:50 -------------------------------------------------------------------------------- Physical Exam Details Patient Name: Date of Service: [name] [name]. 11/05/2021 11:00 A M Medical Record Number: [id number] Patient Account Number: [id number] Date of Birth/Sex: Treating RN: 06/05/1965 (56 y.o. F) Primary Care Provider: Jenna Luo Other Clinician: Referring Provider: Treating Provider/Extender: Michel Bickers in Treatment: 15 Constitutional respirations regular, non-labored and within target range for patient.. Cardiovascular 2+ dorsalis pedis/posterior tibialis pulses. Psychiatric pleasant and  cooperative. Notes Right foot: T the plantar aspect there is an open wound with granulation tissue. No signs of surrounding infection. o Electronic Signature(s) Signed: 11/05/2021 2:00:38 PM By: Kalman Shan DO Entered By: Kalman Shan on 11/05/2021 12:45:23 -------------------------------------------------------------------------------- Physician Orders Details Patient Name: Date of Service: [name] [name]. 11/05/2021 11:00 A M Medical Record Number: [id number] Patient Account Number: [id number] Date of Birth/Sex: Treating RN: February 06, 1966 (56 y.o. [name] Primary Care Provider: Jenna Luo Other Clinician: Referring Provider: Treating Provider/Extender: Michel Bickers in Treatment: 15 Verbal / Phone Orders: No Diagnosis Coding Follow-up Appointments ppointment in 1 week. - on Thursday 11/12/21 @ 10:15am w/ Dr. [place] and Leveda Anna Room # 7 Return A *****EXTRA TIME TCC*** Bathing/ Shower/ Hygiene May shower and wash wound with soap and water. - when changing dressing Edema Control - Lymphedema / SCD / Other Avoid standing for long periods of time. Off-Loading Total Contact Cast to Right Lower Extremity - Right Foot Wound Treatment Wound #3 - Foot Wound Laterality: Plantar, Right Cleanser: Soap and Water 1 x Per Week/30 Days Discharge Instructions: May shower and wash wound with dial antibacterial soap and water prior to dressing change. Cleanser: Wound Cleanser 1 x Per Week/30 Days Discharge Instructions: Cleanse the wound with wound cleanser prior to applying a clean dressing using gauze sponges, not tissue or cotton balls. Prim Dressing: Hydrofera Blue Ready Foam, 2.5 x2.5 in 1 x Per Week/30 Days ary Discharge Instructions: Apply to wound bed as instructed Secondary Dressing: Woven Gauze Sponges 2x2 in 1 x Per Week/30 Days Discharge Instructions: Apply over primary dressing as directed. Secured With: 21M Medipore H Soft Cloth Surgical T ape, 4 x 10 (in/yd) 1 x Per Week/30 Days Discharge Instructions: Secure with tape as directed. Electronic Signature(s) Signed: 11/05/2021 2:00:38 PM By: Kalman Shan DO Entered By: Kalman Shan on 11/05/2021 12:45:33 -------------------------------------------------------------------------------- Problem List Details Patient Name: Date of Service: [name] [name]. 11/05/2021 11:00 A M Medical Record Number: [id number] Patient Account Number: [id number] Date of Birth/Sex: Treating RN: [birth year] (56 y.o. F) Primary Care Provider: Jenna Luo Other Clinician: Referring Provider: Treating Provider/Extender: Michel Bickers in Treatment: 15 Active  Problems ICD-10 Encounter Code Description Active Date MDM Diagnosis L97.512 Non-pressure chronic ulcer of other part of right foot with fat layer exposed 07/23/2021 No Yes E11.621 Type 2 diabetes mellitus with foot ulcer 07/23/2021 No Yes G90.09 Other idiopathic peripheral autonomic neuropathy 07/23/2021 No Yes Inactive Problems Resolved Problems Electronic Signature(s) Signed: 11/05/2021 2:00:38 PM By: Kalman Shan DO Entered By: Kalman Shan on 11/05/2021 12:43:52 -------------------------------------------------------------------------------- Progress Note Details Patient Name: Date of Service: [name] [name]. 11/05/2021 11:00 A M Medical Record Number: [id number] Patient Account Number: [id number] Date of Birth/Sex: Treating RN: 1965-11-04 (56 y.o. F) Primary Care Provider: Jenna Luo Other Clinician: Referring Provider: Treating Provider/Extender: Michel Bickers in Treatment: 15 Subjective Chief Complaint Information obtained from Patient 06/03/2019; patient is here for review of a wound on her plantar foot transmetatarsal amputation site at roughly the third met head 07/23/2021; patient presents for right plantar foot wound History of Present Illness (HPI) ADMISSION 06/02/2020 This is a 56 year old woman who has had problems with wounds on her predominantly plantar right foot for quite a period of time. 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 More recently she has had an area developed  in the right mid foot at roughly the third metatarsal head. She says this started as a blister of November. Its not been closing. More recently she has been using Silvadene cream she has a cam boot to offload. I note she walks with a cane The patient has had arterial studies done in 2019 at which time her ABI in the right was 1.28 with triphasic waveforms  noncompressible on the left with triphasic waveform. Her ABI in our clinic today was 1.3 on the right. 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 She had a foot gastrocnemius recession sometime in the fall 2021 2/21; patient arrives today with a wound measuring slightly smaller 3 x 3 mm however there is circumferential area of erythema around this. She does not have any complaints 2/28; plantar wound on the right first transmetatarsal amputation site. Small oval-shaped wound raised skin around the edges. We have been using Hydrofera Blue. She does not have arterial issues 06/23/2020 upon evaluation today patient appears to be doing decently well. With regard to her wound. There is little bit of slough and some hyper granulation. I do not feel like the PolyMem did too well for her. In fact she may do better with some Hydrofera Blue. I know we just switch from that but I feel like that may be a better way to continue currently. 3/15; this is a patient with a wound on the plantar right transmetatarsal amputation site probably the second metatarsal head not 1 July as stated previously we have been using polymen switch to [facility] last week. Wound looks about the same to me. We are going to start her on a total contact cast today we have the boot to fit 3/18; first total contact cast change she tolerated this well 3/31; she comes back in with out total contact cast on for 10 days. In spite of this the small wound over the third metatarsal head of her right transmetatarsal amputation site almost looks the same slightly hyper granulated. Circumference is not well adhered. We have been using Hydrofera Blue 4/7 she comes back in in her total contact cast. The wound is smaller epithelializing and looks like it on its way to closing. We have been using Hydrofera Blue over the wound  area She has a right transmetatarsal amputation. She tells [place] that she has been to [facility] in the past and they did a modified shoe for her however it did not keep this area epithelialized. She is going to bring that in next week for [place] to look at. It does not sound however that she was really offloading or padding this area at all 4/14; total contact cast. I changed her to calcium alginate today. I think most of the small area is epithelialized although there is clearly not complete epithelialization and even the area that is epithelialized and the small wound is very tiny She shows me her shoe with the insert. It looks as though the insert probably was too large for her transmet it foot allowing it to move back and forward. I can see where the friction was in the blister she describes forming. I have asked her to take this back to [facility] to see if there is anything they can do 4/21; patient presents for her 1 week follow-up. She has been treated with a total contact cast weekly. She has no  complaints today. She denies any fever/chills. 4/26; patient presents for 1 week follow-up in total contact cast change. Patient is having knee pain to her right knee due to chronic osteoarthritis. She is hoping to be able to wear her brace over the cast. 5/10; the wound is closed over however still looks vulnerable. 5/17; the wound remains closed surface looks better. She has a previous right transmet. She is going to convert this into a sneaker. She had a forefoot prosthesis for the foot but not sure that that are too much good she is supposed to have taken aback from modification however she can do that now. 6/8; patient returns to our clinic for reopening of the plantar right foot transmetatarsal amputation site. She has slight warmth and erythema to the area. She would like to try conservative wound care and not do the total contact cast that was she was doing before at this  time 10/01/2020 upon evaluation today patient appears to be doing okay in regard to her foot ulcer. Fortunately there does not appear to be any signs of active infection which is great news and overall very pleased with where things stand today. The wound does appear to be doing well and overall appearance wise is significantly improved compared to last week in my opinion based on my review of her pictures today. Overall the patient tells me that she is not having any significant pain which is great news as well. 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 This is a reoccurring wound for the patient. She has been following with Dr. Earleen Newport for this issue. She had a wound culture that grew Staph aureus sensitive to Bactrim and she is currently on this. She has been using Silvadene to the wound bed. She has a cam walker boot and an offloading scooter that she uses. She currently denies signs of infection. 4/13; this is a patient that is a type II diabetic complicated by peripheral neuropathy. She has had a previous transmetatarsal amputation of the right foot. She has a new wound on the plantar right foot between the first and second met heads probably close to the lateral part of the first met head. Generally small clean wound however from 7-9 o'clock there is a probing area close to bone. She finished a course of trimethoprim/sulfamethoxazole yesterday for methicillin- resistant Staph aureus in the wound. I believe this was ordered by podiatry she has been using a Dakin's wet-to-dry dressing. She uses a cam boot and a knee scooter. She says she is religious with this 4/20; patient presents for follow-up. She continues to use Dakin's wet-to-dry dressings without issues. She states she  would like to try the total contact cast that she has had this in the past. She currently denies signs of infection. 4/24; patient presents for follow-up. She would like the total contact cast placed today. She has been using Dakin's wet-to-dry dressings without issues. She denies signs of infection. 08-12-2021 patient presents today for obligatory first cast change. This was placed on Monday today is Wednesday. She seems to be doing quite well with using Hydrofera Blue and the overall appearance of the wound bed is excellent. We do need to replace the cast the good news is there is nothing that seems to be rubbing at this  point which is awesome. 5/1; patient presents for follow-up. She has tolerated the total contact cast well. She has no issues or complaints today. 5/16; patient presents for follow-up. She states she missed her last week appointment due to having 2 appointments at the same time. She admits to knowing she is not supposed to have the contact cast for over 7 days. She currently denies signs of infection. She has no issues or complaints today. 5/23; patient presents for follow-up. She has no issues or complaints pertaining to the total contact cast. We have been using Hydrofera Blue. She denies signs of infection. 5/30; patient presents for follow-up. She tolerated the total contact cast well over the past week. We have been using Hydrofera Blue under the cast. She has no issues or complaints today. 6/7; total contact cast Hydrofera Blue. Right plantar foot TMA site 6/15; patient presents for follow-up. She has had the total contact cast with Hydrofera Blue for the past week. She has no issues or complaints today. 6/20; patient presents for follow-up. She was without the total contact cast for the past week since the wound appeared closed. She has no issues or complaints today. 6/27; patient presents for follow-up. She tolerated the total contact cast well. She has no issues or  complaints today. The wound appears well-healing. 7/6; patient presents for follow-up. She tolerated the total contact cast well. We have been using Hydrofera Blue under the cast. 7/20; patient presents with reopening of her right plantar foot wound. She states this happened about 1 week ago. She has been keeping the area covered. She denies signs of infection. Patient History Information obtained from Patient. Family History Cancer - Paternal Grandparents, Diabetes - Mother,Maternal Grandparents, Heart Disease - Mother,Paternal Grandparents,Maternal Grandparents,Father,Siblings, Hypertension - Mother,Father, Lung Disease - Mother, No family history of Hereditary Spherocytosis, Kidney Disease, Seizures, Stroke, Thyroid Problems, Tuberculosis. Social History Never smoker, Marital Status - Widowed, Alcohol Use - Never, Drug Use - Prior History, Caffeine Use - Daily - soda's. Medical History Eyes Denies history of Cataracts, Glaucoma, Optic Neuritis Ear/Nose/Mouth/Throat Denies history of Chronic sinus problems/congestion, Middle ear problems Hematologic/Lymphatic Denies history of Anemia, Hemophilia, Human Immunodeficiency Virus, Lymphedema, Sickle Cell Disease Respiratory Patient has history of Asthma, Sleep Apnea Denies history of Aspiration, Chronic Obstructive Pulmonary Disease (COPD), Pneumothorax, Tuberculosis Cardiovascular Patient has history of Hypertension Denies history of Angina, Arrhythmia, Congestive Heart Failure, Coronary Artery Disease, Deep Vein Thrombosis, Hypotension, Myocardial Infarction, Peripheral Arterial Disease, Peripheral Venous Disease, Phlebitis, Vasculitis Gastrointestinal Denies history of Cirrhosis , Colitis, Crohnoos, Hepatitis A, Hepatitis B, Hepatitis C Endocrine Denies history of Type I Diabetes, Type II Diabetes Genitourinary Denies history of End Stage Renal Disease Immunological Denies history of Lupus Erythematosus, Raynaudoos,  Scleroderma Integumentary (Skin) Denies history of History of Burn Musculoskeletal Patient has history of Osteoarthritis Denies history of Gout, Rheumatoid Arthritis, Osteomyelitis Neurologic Patient has history of Neuropathy, 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 - left 4th toe amp. - neck surgery. - spine surgery. - abdom. hysterectomy. - appendectomy. - lap. knee surgery. Medical A Surgical History Notes nd Constitutional Symptoms (General Health) torn right shoulder rotator cuff. Gastrointestinal Hx of GERD Psychiatric hx of bipolar 1 disorder, anxiety, depression. Objective Constitutional respirations regular, non-labored and within target range for patient.. Vitals Time Taken: 11:41 AM, Height: 64 in, Weight: 228 lbs, BMI: 39.1,  Temperature: 97.7 F, Pulse: 84 bpm, Respiratory Rate: 18 breaths/min, Blood Pressure: 136/86 mmHg. Cardiovascular 2+ dorsalis pedis/posterior tibialis pulses. Psychiatric pleasant and cooperative. General Notes: Right foot: T the plantar aspect there is an open wound with granulation tissue. No signs of surrounding infection. o Integumentary (Hair, Skin) Wound #3 status is Open. Original cause of wound was Gradually Appeared. The date acquired was: 11/05/2021. The wound is located on the [place]. The wound measures 0.3cm length x 0.4cm width x 0.1cm depth; 0.094cm^2 area and 0.009cm^3 volume. There is Fat Layer (Subcutaneous Tissue) exposed. There is no tunneling or undermining noted. There is a medium amount of sanguinous drainage noted. The wound margin is distinct with the outline attached to the wound base. There is large (67-100%) red granulation within the wound bed. There is  no necrotic tissue within the wound bed. Assessment Active Problems ICD-10 Non-pressure chronic ulcer of other part of right foot with fat layer exposed Type 2 diabetes mellitus with foot ulcer Other idiopathic peripheral autonomic neuropathy Unfortunately patient's wound reopened. At this time I recommended she follow-up with podiatry to obtain orthotic shoes. The wound is very small and I recommended Hydrofera Blue under the total contact cast. She knows she cannot drive in this cast. She has someone with her today to drive her. Procedures Wound #3 Pre-procedure diagnosis of Wound #3 is a Neuropathic Ulcer-Non Diabetic located on the [place] . There was a T Programmer, multimedia Procedure by Cephas Darby, Denim Kalmbach, DO. Post procedure Diagnosis Wound #3: Same as Pre-Procedure Plan Follow-up Appointments: Return Appointment in 1 week. - on Thursday 11/12/21 @ 10:15am w/ Dr. Heber Glencoe and Leveda Anna Room # 7 *****EXTRA TIME TCC*** Bathing/ Shower/ Hygiene: May shower and wash wound with soap and water. - when changing dressing Edema Control - Lymphedema / SCD / Other: Avoid standing for long periods of time. Off-Loading: T Contact Cast to Right Lower Extremity - Right Foot otal WOUND #3: - Foot Wound Laterality: Plantar, Right Cleanser: Soap and Water 1 x Per Week/30 Days Discharge Instructions: May shower and wash wound with dial antibacterial soap and water prior to dressing change. Cleanser: Wound Cleanser 1 x Per Week/30 Days Discharge Instructions: Cleanse the wound with wound cleanser prior to applying a clean dressing using gauze sponges, not tissue or cotton balls. Prim Dressing: Hydrofera Blue Ready Foam, 2.5 x2.5 in 1 x Per Week/30 Days ary Discharge Instructions: Apply to wound bed as instructed Secondary Dressing: Woven Gauze Sponges 2x2 in 1 x Per Week/30 Days Discharge Instructions: Apply over primary dressing as directed. Secured With: 57M Medipore H Soft Cloth Surgical T ape,  4 x 10 (in/yd) 1 x Per Week/30 Days Discharge Instructions: Secure with tape as directed. 1. T contact cast placed in standard fashion otal 2. Hydrofera Blue 3. Follow-up with podiatry for orthotic shoes 4. Follow-up in 1 week Electronic Signature(s) Signed: 11/05/2021 2:00:38 PM By: Kalman Shan DO Entered By: Kalman Shan on 11/05/2021 12:48:23 -------------------------------------------------------------------------------- HxROS Details Patient Name: Date of Service: [name] [name]. 11/05/2021 11:00 A M Medical Record Number: [id number] Patient Account Number: [id number] Date of Birth/Sex: Treating RN: 07/19/1965 (56 y.o. F) Primary Care Provider: Jenna Luo Other Clinician: Referring Provider: Treating Provider/Extender: Michel Bickers in Treatment: 15 Information Obtained From Patient Constitutional Symptoms (General Health) Medical History: Past Medical History Notes: torn right shoulder rotator cuff. Eyes Medical History: Negative for: Cataracts; Glaucoma; Optic Neuritis Ear/Nose/Mouth/Throat Medical History: Negative for: Chronic sinus problems/congestion; Middle ear problems Hematologic/Lymphatic Medical History: Negative  for: Anemia; Hemophilia; Human Immunodeficiency Virus; Lymphedema; Sickle Cell Disease Respiratory Medical History: Positive for: Asthma; Sleep Apnea Negative for: Aspiration; Chronic Obstructive Pulmonary Disease (COPD); Pneumothorax; Tuberculosis Cardiovascular Medical History: Positive for: Hypertension Negative for: Angina; Arrhythmia; Congestive Heart Failure; Coronary Artery Disease; Deep Vein Thrombosis; Hypotension; Myocardial Infarction; Peripheral Arterial Disease; Peripheral Venous Disease; Phlebitis; Vasculitis Gastrointestinal Medical History: Negative for: Cirrhosis ; Colitis; Crohns; Hepatitis A; Hepatitis B; Hepatitis C Past Medical History Notes: Hx of GERD Endocrine Medical  History: Negative for: Type I Diabetes; Type II Diabetes Genitourinary Medical History: Negative for: End Stage Renal Disease Immunological Medical History: Negative for: Lupus Erythematosus; Raynauds; Scleroderma Integumentary (Skin) Medical History: Negative for: History of Burn Musculoskeletal Medical History: Positive for: Osteoarthritis Negative for: Gout; Rheumatoid Arthritis; Osteomyelitis Neurologic Medical History: Positive for: Neuropathy; Seizure Disorder - hx of seizures r/t tramadol Negative for: Dementia; Quadriplegia; Paraplegia Oncologic Medical History: Negative for: Received Chemotherapy; Received Radiation Psychiatric Medical History: Negative for: Anorexia/bulimia; Confinement Anxiety Past Medical History Notes: hx of bipolar 1 disorder, anxiety, depression. Immunizations Pneumococcal Vaccine: Received Pneumococcal Vaccination: Yes Received Pneumococcal Vaccination On or After 60th Birthday: No Immunization Notes: pt. doesn't remember last tetanus shot Implantable Devices None Hospitalization / Surgery History Type of Hospitalization/Surgery trasmet amp right foot left 4th toe amp neck surgery spine surgery abdom. hysterectomy appendectomy lap. knee surgery Family and Social History Cancer: Yes - Paternal Grandparents; Diabetes: Yes - Mother,Maternal Grandparents; Heart Disease: Yes - Mother,Paternal Grandparents,Maternal Grandparents,Father,Siblings; Hereditary Spherocytosis: No; Hypertension: Yes - Mother,Father; Kidney Disease: No; Lung Disease: Yes - Mother; Seizures: No; Stroke: No; Thyroid Problems: No; Tuberculosis: No; Never smoker; Marital Status - Widowed; Alcohol Use: Never; Drug Use: Prior History; Caffeine Use: Daily - soda's; Financial Concerns: No; Food, Clothing or Shelter Needs: No; Support System Lacking: No; Transportation Concerns: No Electronic Signature(s) Signed: 11/05/2021 2:00:38 PM By: Kalman Shan DO Entered By:  Kalman Shan on 11/05/2021 12:44:57 -------------------------------------------------------------------------------- Total Contact Cast Details Patient Name: Date of Service: [name] [name], [name] [name] 11/05/2021 11:00 A M Medical Record Number: [id number] Patient Account Number: [id number] Date of Birth/Sex: Treating RN: 18-Dec-1965 (56 y.o. [name] Primary Care Provider: Jenna Luo Other Clinician: Referring Provider: Treating Provider/Extender: Michel Bickers in Treatment: 13 T Contact Cast Applied for Wound Assessment: otal Wound #3 Right,Plantar Foot Performed By: Physician Kalman Shan, DO Post Procedure Diagnosis Same as Pre-procedure Electronic Signature(s) Signed: 11/05/2021 2:00:38 PM By: Kalman Shan DO Signed: 11/05/2021 2:42:53 PM By: Lorrin Jackson Entered By: Lorrin Jackson on 11/05/2021 12:01:57 -------------------------------------------------------------------------------- [place] Details Patient Name: Date of Service: [name] [name] 11/05/2021 Medical Record Number: [id number] Patient Account Number: [id number] Date of Birth/Sex: Treating RN: 11-Nov-1965 (56 y.o. [name] Primary Care Provider: Jenna Luo Other Clinician: Referring Provider: Treating Provider/Extender: Michel Bickers in Treatment: 15 Diagnosis Coding ICD-10 Codes Code Description [phone] Non-pressure chronic ulcer of other part of right foot with fat layer exposed E11.621 Type 2 diabetes mellitus with foot ulcer G90.09 Other idiopathic peripheral autonomic neuropathy Facility Procedures CPT4 Code: [id number] Description: [phone] - APPLY TOTAL CONTACT LEG CAST ICD-10 Diagnosis Description L97.512 Non-pressure chronic ulcer of other part of right foot with fat layer exposed Modifier: Quantity: 1 Physician Procedures : CPT4 Code Description Modifier [id number] - WC PHYS APPLY TOTAL CONTACT CAST ICD-10  Diagnosis Description L97.512 Non-pressure chronic ulcer of other part of right foot with fat layer exposed Quantity: 1 Electronic Signature(s) Signed: 11/05/2021 2:00:38 PM By: Kalman Shan DO Entered By: Kalman Shan on 11/05/2021 12:48:33

## 2021-11-12 ENCOUNTER — Encounter (HOSPITAL_BASED_OUTPATIENT_CLINIC_OR_DEPARTMENT_OTHER): Payer: Medicare Other | Admitting: Internal Medicine

## 2021-11-12 DIAGNOSIS — L97512 Non-pressure chronic ulcer of other part of right foot with fat layer exposed: Secondary | ICD-10-CM

## 2021-11-12 DIAGNOSIS — E11621 Type 2 diabetes mellitus with foot ulcer: Secondary | ICD-10-CM | POA: Diagnosis not present

## 2021-11-12 DIAGNOSIS — G9009 Other idiopathic peripheral autonomic neuropathy: Secondary | ICD-10-CM | POA: Diagnosis not present

## 2021-11-12 NOTE — Progress Notes (Signed)
[name], [name] ([id number]) Visit Report for 11/12/2021 Chief Complaint Document Details Patient Name: Date of Service: [name], [name] 11/12/2021 10:15 A M Medical Record Number: [id number] Patient Account Number: [id number] Date of Birth/Sex: Treating RN: 11-23-1965 (56 y.o. [name] Primary Care Provider: Jenna Luo Other Clinician: Referring Provider: Treating Provider/Extender: Michel Bickers in Treatment: 16 Information Obtained from: Patient Chief Complaint 06/03/2019; patient is here for review of a wound on her plantar foot transmetatarsal amputation site at roughly the third met head 07/23/2021; patient presents for right plantar foot wound Electronic Signature(s) Signed: 11/12/2021 2:23:32 PM By: Kalman Shan DO Entered By: Kalman Shan on 11/12/2021 12:33:23 -------------------------------------------------------------------------------- HPI Details Patient Name: Date of Service: [name] Shutter D. 11/12/2021 10:15 A M Medical Record Number: [id number] Patient Account Number: [id number] Date of Birth/Sex: Treating RN: 1965/06/23 (56 y.o. [name] Primary Care Provider: Jenna Luo Other Clinician: Referring Provider: Treating Provider/Extender: Michel Bickers in Treatment: 16 History of Present Illness HPI Description: ADMISSION 06/02/2020 This is a 56 year old woman who has had problems with wounds on her predominantly plantar right foot for quite a period of time. 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 More recently she has had an area developed in the right mid foot at roughly the third metatarsal head. She says this started as a blister of November. Its not been closing. More recently she has been using Silvadene cream she has a cam boot to offload. I note she walks with a  cane The patient has had arterial studies done in 2019 at which time her ABI in the right was 1.28 with triphasic waveforms noncompressible on the left with triphasic waveform. Her ABI in our clinic today was 1.3 on the right. 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 She had a foot gastrocnemius recession sometime in the fall 2021 2/21; patient arrives today with a wound measuring slightly smaller 3 x 3 mm however there is circumferential area of erythema around this. She does not have any complaints 2/28; plantar wound on the right first transmetatarsal amputation site. Small oval-shaped wound raised skin around the edges. We have been using Hydrofera Blue. She does not have arterial issues 06/23/2020 upon evaluation today patient appears to be doing decently well. With regard to her wound. There is little bit of slough and some hyper granulation. I do not feel like the PolyMem did too well for her. In fact she may do better with some Hydrofera Blue. I know we just switch from that but I feel like that may be a better way to continue currently. 3/15; this is a patient with a wound on the plantar right transmetatarsal amputation site probably the second metatarsal head not 1 July as stated previously we have been using polymen switch to [facility] last week. Wound looks about the same to me. We are going to start her on a total contact cast today we have the boot to fit 3/18; first total contact cast change she tolerated this well 3/31; she comes back in with out total contact cast on for 10 days. In spite of this the small wound over the third metatarsal head of her right transmetatarsal amputation site almost looks the same slightly hyper granulated. Circumference  is not well adhered. We have been using Hydrofera Blue 4/7 she comes back in in her total contact cast. The wound  is smaller epithelializing and looks like it on its way to closing. We have been using Hydrofera Blue over the wound area She has a right transmetatarsal amputation. She tells [place] that she has been to [facility] in the past and they did a modified shoe for her however it did not keep this area epithelialized. She is going to bring that in next week for [place] to look at. It does not sound however that she was really offloading or padding this area at all 4/14; total contact cast. I changed her to calcium alginate today. I think most of the small area is epithelialized although there is clearly not complete epithelialization and even the area that is epithelialized and the small wound is very tiny She shows me her shoe with the insert. It looks as though the insert probably was too large for her transmet it foot allowing it to move back and forward. I can see where the friction was in the blister she describes forming. I have asked her to take this back to [facility] to see if there is anything they can do 4/21; patient presents for her 1 week follow-up. She has been treated with a total contact cast weekly. She has no complaints today. She denies any fever/chills. 4/26; patient presents for 1 week follow-up in total contact cast change. Patient is having knee pain to her right knee due to chronic osteoarthritis. She is hoping to be able to wear her brace over the cast. 5/10; the wound is closed over however still looks vulnerable. 5/17; the wound remains closed surface looks better. She has a previous right transmet. She is going to convert this into a sneaker. She had a forefoot prosthesis for the foot but not sure that that are too much good she is supposed to have taken aback from modification however she can do that now. 6/8; patient returns to our clinic for reopening of the plantar right foot transmetatarsal amputation site. She has slight warmth and erythema to the area.  She would like to try conservative wound care and not do the total contact cast that was she was doing before at this time 10/01/2020 upon evaluation today patient appears to be doing okay in regard to her foot ulcer. Fortunately there does not appear to be any signs of active infection which is great news and overall very pleased with where things stand today. The wound does appear to be doing well and overall appearance wise is significantly improved compared to last week in my opinion based on my review of her pictures today. Overall the patient tells me that she is not having any significant pain which is great news as well. 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 This is a reoccurring wound for the patient. She has been following with Dr. Earleen Newport for this issue. She had a wound culture that grew Staph aureus sensitive to Bactrim and she is currently on this. She has been using Silvadene to the wound bed. She has a cam walker boot and an offloading scooter that she uses. She currently denies signs of infection. 4/13; this is a  patient that is a type II diabetic complicated by peripheral neuropathy. She has had a previous transmetatarsal amputation of the right foot. She has a new wound on the plantar right foot between the first and second met heads probably close to the lateral part of the first met head. Generally small clean wound however from 7-9 o'clock there is a probing area close to bone. She finished a course of trimethoprim/sulfamethoxazole yesterday for methicillin- resistant Staph aureus in the wound. I believe this was ordered by podiatry she has been using a Dakin's wet-to-dry dressing. She uses a cam boot and a knee scooter. She says she is religious with  this 4/20; patient presents for follow-up. She continues to use Dakin's wet-to-dry dressings without issues. She states she would like to try the total contact cast that she has had this in the past. She currently denies signs of infection. 4/24; patient presents for follow-up. She would like the total contact cast placed today. She has been using Dakin's wet-to-dry dressings without issues. She denies signs of infection. 08-12-2021 patient presents today for obligatory first cast change. This was placed on Monday today is Wednesday. She seems to be doing quite well with using Hydrofera Blue and the overall appearance of the wound bed is excellent. We do need to replace the cast the good news is there is nothing that seems to be rubbing at this point which is awesome. 5/1; patient presents for follow-up. She has tolerated the total contact cast well. She has no issues or complaints today. 5/16; patient presents for follow-up. She states she missed her last week appointment due to having 2 appointments at the same time. She admits to knowing she is not supposed to have the contact cast for over 7 days. She currently denies signs of infection. She has no issues or complaints today. 5/23; patient presents for follow-up. She has no issues or complaints pertaining to the total contact cast. We have been using Hydrofera Blue. She denies signs of infection. 5/30; patient presents for follow-up. She tolerated the total contact cast well over the past week. We have been using Hydrofera Blue under the cast. She has no issues or complaints today. 6/7; total contact cast Hydrofera Blue. Right plantar foot TMA site 6/15; patient presents for follow-up. She has had the total contact cast with Hydrofera Blue for the past week. She has no issues or complaints today. 6/20; patient presents for follow-up. She was without the total contact cast for the past week since the wound appeared closed. She has no issues  or complaints today. 6/27; patient presents for follow-up. She tolerated the total contact cast well. She has no issues or complaints today. The wound appears well-healing. 7/6; patient presents for follow-up. She tolerated the total contact cast well. We have been using Hydrofera Blue under the cast. 7/20; patient presents with reopening of her right plantar foot wound. She states this happened about 1 week ago. She has been keeping the area covered. She denies signs of infection. 7/27; patient presents for follow-up. She tolerated the cast well. The wound is healed. She denies signs of infection. Electronic Signature(s) Signed: 11/12/2021 2:23:32 PM By: Kalman Shan DO Entered By: Kalman Shan on 11/12/2021 12:33:49 -------------------------------------------------------------------------------- Physical Exam Details Patient Name: Date of Service: [name] Shutter D. 11/12/2021 10:15 A M Medical Record Number: [id number] Patient Account Number: [id number] Date of Birth/Sex: Treating RN: October 18, 1965 (56 y.o. [name] Primary Care Provider: Jenna Luo Other Clinician: Referring Provider: Treating Provider/Extender: Kalman Shan  Jenna Luo Weeks in Treatment: 16 Constitutional respirations regular, non-labored and within target range for patient.. Cardiovascular 2+ dorsalis pedis/posterior tibialis pulses. Psychiatric pleasant and cooperative. Notes Right foot: T the plantar aspect there is epithelization to the previous wound site. o Electronic Signature(s) Signed: 11/12/2021 2:23:32 PM By: Kalman Shan DO Entered By: Kalman Shan on 11/12/2021 12:34:13 -------------------------------------------------------------------------------- Physician Orders Details Patient Name: Date of Service: [name] Shutter D. 11/12/2021 10:15 A M Medical Record Number: [id number] Patient Account Number: [id number] Date of Birth/Sex: Treating RN: 04/15/1966 (56 y.o. [name] Primary Care Provider: Jenna Luo Other Clinician: Referring Provider: Treating Provider/Extender: Michel Bickers in Treatment: 75 Verbal / Phone Orders: No Diagnosis Coding ICD-10 Coding Code Description [phone] Non-pressure chronic ulcer of other part of right foot with fat layer exposed E11.621 Type 2 diabetes mellitus with foot ulcer G90.09 Other idiopathic peripheral autonomic neuropathy Follow-up Appointments ppointment in 1 week. - on Tues 11/17/21 @ 9:30am w/ Dr. Heber [place] and Leveda Anna Room # 7 Return A *****EXTRA TIME TCC*** Bathing/ Shower/ Hygiene May shower and wash wound with soap and water. - when changing dressing Edema Control - Lymphedema / SCD / Other Avoid standing for long periods of time. Off-Loading Total Contact Cast to Right Lower Extremity - Right Foot Wound Treatment Wound #3 - Foot Wound Laterality: Plantar, Right Cleanser: Soap and Water 1 x Per Week/30 Days Discharge Instructions: May shower and wash wound with dial antibacterial soap and water prior to dressing change. Cleanser: Wound Cleanser 1 x Per Week/30 Days Discharge Instructions: Cleanse the wound with wound cleanser prior to applying a clean dressing using gauze sponges, not tissue or cotton balls. Prim Dressing: KerraCel Ag Gelling Fiber Dressing, 2x2 in (silver alginate) ary 1 x Per Week/30 Days Discharge Instructions: Apply silver alginate to wound bed as instructed Secondary Dressing: Woven Gauze Sponges 2x2 in 1 x Per Week/30 Days Discharge Instructions: Apply over primary dressing as directed. Secured With: 6M Medipore H Soft Cloth Surgical T ape, 4 x 10 (in/yd) 1 x Per Week/30 Days Discharge Instructions: Secure with tape as directed. Electronic Signature(s) Signed: 11/12/2021 2:23:32 PM By: Kalman Shan DO Entered By: Kalman Shan on 11/12/2021  12:34:23 -------------------------------------------------------------------------------- Problem List Details Patient Name: Date of Service: [name] Shutter D. 11/12/2021 10:15 A M Medical Record Number: [id number] Patient Account Number: [id number] Date of Birth/Sex: Treating RN: Sep 09, 1965 (56 y.o. [name] Primary Care Provider: Jenna Luo Other Clinician: Referring Provider: Treating Provider/Extender: Michel Bickers in Treatment: 16 Active Problems ICD-10 Encounter Code Description Active Date MDM Diagnosis L97.512 Non-pressure chronic ulcer of other part of right foot with fat layer exposed 07/23/2021 No Yes E11.621 Type 2 diabetes mellitus with foot ulcer 07/23/2021 No Yes G90.09 Other idiopathic peripheral autonomic neuropathy 07/23/2021 No Yes Inactive Problems Resolved Problems Electronic Signature(s) Signed: 11/12/2021 2:23:32 PM By: Kalman Shan DO Entered By: Kalman Shan on 11/12/2021 12:33:12 -------------------------------------------------------------------------------- Progress Note Details Patient Name: Date of Service: [name] Shutter D. 11/12/2021 10:15 A M Medical Record Number: [id number] Patient Account Number: [id number] Date of Birth/Sex: Treating RN: 1966-02-14 (56 y.o. [name] Primary Care Provider: Jenna Luo Other Clinician: Referring Provider: Treating Provider/Extender: Michel Bickers in Treatment: 16 Subjective Chief Complaint Information obtained from Patient 06/03/2019; patient is here for review of a wound on her plantar foot transmetatarsal amputation site at roughly the third met head 07/23/2021; patient presents for right plantar foot wound History of Present Illness (HPI) ADMISSION 06/02/2020 This is a  56 year old woman who has had problems with wounds on her predominantly plantar right foot for quite a period of time. 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 More recently she has had an area developed in the right mid foot at roughly the third metatarsal head. She says this started as a blister of November. Its not been closing. More recently she has been using Silvadene cream she has a cam boot to offload. I note she walks with a cane The patient has had arterial studies done in 2019 at which time her ABI in the right was 1.28 with triphasic waveforms noncompressible on the left with triphasic waveform. Her ABI in our clinic today was 1.3 on the right. 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 She had a foot gastrocnemius recession sometime in the fall 2021 2/21; patient arrives today with a wound measuring slightly smaller 3 x 3 mm however there is circumferential area of erythema around this. She does not have any complaints 2/28; plantar wound on the right first transmetatarsal amputation site. Small oval-shaped wound raised skin around the edges. We have been using Hydrofera Blue. She does not have arterial issues 06/23/2020 upon evaluation today patient appears to be doing decently well. With regard to her wound. There is little bit of slough and some hyper granulation. I do not feel like the PolyMem did too well for her. In fact she may do better with some Hydrofera Blue. I know we just switch from that but I feel like that may be a better way to continue currently. 3/15; this is a patient with a wound on the plantar right transmetatarsal amputation site probably the second metatarsal head not 1 July as stated previously we have been using polymen switch to [facility] last week. Wound looks about the same to me. We are going to start her on a total contact cast today we have the boot to fit 3/18; first total contact  cast change she tolerated this well 3/31; she comes back in with out total contact cast on for 10 days. In spite of this the small wound over the third metatarsal head of her right transmetatarsal amputation site almost looks the same slightly hyper granulated. Circumference is not well adhered. We have been using Hydrofera Blue 4/7 she comes back in in her total contact cast. The wound is smaller epithelializing and looks like it on its way to closing. We have been using Hydrofera Blue over the wound area She has a right transmetatarsal amputation. She tells [place] that she has been to [facility] in the past and they did a modified shoe for her however it did not keep this area epithelialized. She is going to bring that in next week for [place] to look at. It does not sound however that she was really offloading or padding this area at all 4/14; total contact cast. I changed her to calcium alginate today. I think most of the small area is epithelialized although there is clearly not complete epithelialization and even the area that is epithelialized and the small wound is very tiny She shows me her shoe with the insert. It looks as though the insert probably was too large for her transmet it foot allowing it to  move back and forward. I can see where the friction was in the blister she describes forming. I have asked her to take this back to [facility] to see if there is anything they can do 4/21; patient presents for her 1 week follow-up. She has been treated with a total contact cast weekly. She has no complaints today. She denies any fever/chills. 4/26; patient presents for 1 week follow-up in total contact cast change. Patient is having knee pain to her right knee due to chronic osteoarthritis. She is hoping to be able to wear her brace over the cast. 5/10; the wound is closed over however still looks vulnerable. 5/17; the wound remains closed surface looks better. She has a previous  right transmet. She is going to convert this into a sneaker. She had a forefoot prosthesis for the foot but not sure that that are too much good she is supposed to have taken aback from modification however she can do that now. 6/8; patient returns to our clinic for reopening of the plantar right foot transmetatarsal amputation site. She has slight warmth and erythema to the area. She would like to try conservative wound care and not do the total contact cast that was she was doing before at this time 10/01/2020 upon evaluation today patient appears to be doing okay in regard to her foot ulcer. Fortunately there does not appear to be any signs of active infection which is great news and overall very pleased with where things stand today. The wound does appear to be doing well and overall appearance wise is significantly improved compared to last week in my opinion based on my review of her pictures today. Overall the patient tells me that she is not having any significant pain which is great news as well. 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 This is a reoccurring wound for the patient. She has been following with Dr. Earleen Newport for this issue. She had a wound culture that grew Staph aureus sensitive to Bactrim and she is currently on this. She has been using Silvadene to the wound bed. She has a cam walker boot and an offloading scooter that she uses. She currently denies signs of infection. 4/13; this is a patient that is a type II diabetic complicated by peripheral neuropathy. She has had a previous transmetatarsal amputation of the right foot. She has a new wound on the plantar right foot between the first and second met heads probably close to the lateral part of the first met  head. Generally small clean wound however from 7-9 o'clock there is a probing area close to bone. She finished a course of trimethoprim/sulfamethoxazole yesterday for methicillin- resistant Staph aureus in the wound. I believe this was ordered by podiatry she has been using a Dakin's wet-to-dry dressing. She uses a cam boot and a knee scooter. She says she is religious with this 4/20; patient presents for follow-up. She continues to use Dakin's wet-to-dry dressings without issues. She states she would like to try the total contact cast that she has had this in the past. She currently denies signs of infection. 4/24; patient presents for follow-up. She would like the total contact cast placed today. She has been using Dakin's wet-to-dry dressings without issues. She denies signs of  infection. 08-12-2021 patient presents today for obligatory first cast change. This was placed on Monday today is Wednesday. She seems to be doing quite well with using Hydrofera Blue and the overall appearance of the wound bed is excellent. We do need to replace the cast the good news is there is nothing that seems to be rubbing at this point which is awesome. 5/1; patient presents for follow-up. She has tolerated the total contact cast well. She has no issues or complaints today. 5/16; patient presents for follow-up. She states she missed her last week appointment due to having 2 appointments at the same time. She admits to knowing she is not supposed to have the contact cast for over 7 days. She currently denies signs of infection. She has no issues or complaints today. 5/23; patient presents for follow-up. She has no issues or complaints pertaining to the total contact cast. We have been using Hydrofera Blue. She denies signs of infection. 5/30; patient presents for follow-up. She tolerated the total contact cast well over the past week. We have been using Hydrofera Blue under the cast. She has no issues or complaints  today. 6/7; total contact cast Hydrofera Blue. Right plantar foot TMA site 6/15; patient presents for follow-up. She has had the total contact cast with Hydrofera Blue for the past week. She has no issues or complaints today. 6/20; patient presents for follow-up. She was without the total contact cast for the past week since the wound appeared closed. She has no issues or complaints today. 6/27; patient presents for follow-up. She tolerated the total contact cast well. She has no issues or complaints today. The wound appears well-healing. 7/6; patient presents for follow-up. She tolerated the total contact cast well. We have been using Hydrofera Blue under the cast. 7/20; patient presents with reopening of her right plantar foot wound. She states this happened about 1 week ago. She has been keeping the area covered. She denies signs of infection. 7/27; patient presents for follow-up. She tolerated the cast well. The wound is healed. She denies signs of infection. Patient History Information obtained from Patient. Family History Cancer - Paternal Grandparents, Diabetes - Mother,Maternal Grandparents, Heart Disease - Mother,Paternal Grandparents,Maternal Grandparents,Father,Siblings, Hypertension - Mother,Father, Lung Disease - Mother, No family history of Hereditary Spherocytosis, Kidney Disease, Seizures, Stroke, Thyroid Problems, Tuberculosis. Social History Never smoker, Marital Status - Widowed, Alcohol Use - Never, Drug Use - Prior History, Caffeine Use - Daily - soda's. Medical History Eyes Denies history of Cataracts, Glaucoma, Optic Neuritis Ear/Nose/Mouth/Throat Denies history of Chronic sinus problems/congestion, Middle ear problems Hematologic/Lymphatic Denies history of Anemia, Hemophilia, Human Immunodeficiency Virus, Lymphedema, Sickle Cell Disease Respiratory Patient has history of Asthma, Sleep Apnea Denies history of Aspiration, Chronic Obstructive Pulmonary Disease (COPD),  Pneumothorax, Tuberculosis Cardiovascular Patient has history of Hypertension Denies history of Angina, Arrhythmia, Congestive Heart Failure, Coronary Artery Disease, Deep Vein Thrombosis, Hypotension, Myocardial Infarction, Peripheral Arterial Disease, Peripheral Venous Disease, Phlebitis, Vasculitis Gastrointestinal Denies history of Cirrhosis , Colitis, Crohnoos, Hepatitis A, Hepatitis B, Hepatitis C Endocrine Denies history of Type I Diabetes, Type II Diabetes Genitourinary Denies history of End Stage Renal Disease Immunological Denies history of Lupus Erythematosus, Raynaudoos, Scleroderma Integumentary (Skin) Denies history of History of Burn Musculoskeletal Patient has history of Osteoarthritis Denies history of Gout, Rheumatoid Arthritis, Osteomyelitis Neurologic Patient has history of Neuropathy, Seizure Disorder - hx of seizures r/t tramadol Denies history of Dementia, Quadriplegia, Paraplegia Oncologic Denies history of Received Chemotherapy, Received Radiation Psychiatric Denies history of Anorexia/bulimia, Confinement  Anxiety Hospitalization/Surgery History - trasmet amp right foot. - left 4th toe amp. - neck surgery. - spine surgery. - abdom. hysterectomy. - appendectomy. - lap. knee surgery. Medical A Surgical History Notes nd Constitutional Symptoms (General Health) torn right shoulder rotator cuff. Gastrointestinal Hx of GERD Psychiatric hx of bipolar 1 disorder, anxiety, depression. Objective Constitutional respirations regular, non-labored and within target range for patient.. Vitals Time Taken: 10:41 AM, Height: 64 in, Weight: 228 lbs, BMI: 39.1, Temperature: 97.8 F, Pulse: 75 bpm, Respiratory Rate: 18 breaths/min, Blood Pressure: 114/77 mmHg. Cardiovascular 2+ dorsalis pedis/posterior tibialis pulses. Psychiatric pleasant and cooperative. General Notes: Right foot: T the plantar aspect there is epithelization to the previous wound  site. o Integumentary (Hair, Skin) Wound #3 status is Open. Original cause of wound was Gradually Appeared. The date acquired was: 11/05/2021. The wound has been in treatment 1 weeks. The wound is located on the [place]. The wound measures 0.2cm length x 0.2cm width x 0.1cm depth; 0.031cm^2 area and 0.003cm^3 volume. There is Fat Layer (Subcutaneous Tissue) exposed. There is no tunneling or undermining noted. There is a medium amount of serosanguineous drainage noted. The wound margin is distinct with the outline attached to the wound base. There is large (67-100%) pink granulation within the wound bed. There is no necrotic tissue within the wound bed. Assessment Active Problems ICD-10 Non-pressure chronic ulcer of other part of right foot with fat layer exposed Type 2 diabetes mellitus with foot ulcer Other idiopathic peripheral autonomic neuropathy Patient has done well with the total contact cast. The wound is healed. Last time she healed the wound this reopened fairly quickly. She would benefit from another cast to help continue with wound healing by aggressively offloading the area. Patient was agreeable to this. Procedures Wound #3 Pre-procedure diagnosis of Wound #3 is a Neuropathic Ulcer-Non Diabetic located on the [place] . There was a T Programmer, multimedia Procedure by Cephas Darby, Erika Hussar, DO. Post procedure Diagnosis Wound #3: Same as Pre-Procedure Plan Follow-up Appointments: Return Appointment in 1 week. - on Tues 11/17/21 @ 9:30am w/ Dr. Heber Alturas and Leveda Anna Room # 7 *****EXTRA TIME TCC*** Bathing/ Shower/ Hygiene: May shower and wash wound with soap and water. - when changing dressing Edema Control - Lymphedema / SCD / Other: Avoid standing for long periods of time. Off-Loading: T Contact Cast to Right Lower Extremity - Right Foot otal WOUND #3: - Foot Wound Laterality: Plantar, Right Cleanser: Soap and Water 1 x Per Week/30 Days Discharge Instructions: May  shower and wash wound with dial antibacterial soap and water prior to dressing change. Cleanser: Wound Cleanser 1 x Per Week/30 Days Discharge Instructions: Cleanse the wound with wound cleanser prior to applying a clean dressing using gauze sponges, not tissue or cotton balls. Prim Dressing: KerraCel Ag Gelling Fiber Dressing, 2x2 in (silver alginate) 1 x Per Week/30 Days ary Discharge Instructions: Apply silver alginate to wound bed as instructed Secondary Dressing: Woven Gauze Sponges 2x2 in 1 x Per Week/30 Days Discharge Instructions: Apply over primary dressing as directed. Secured With: 30M Medipore H Soft Cloth Surgical T ape, 4 x 10 (in/yd) 1 x Per Week/30 Days Discharge Instructions: Secure with tape as directed. 1. T contact cast placed in standard fashion today otal 2. Silver alginate over the previous wound site For potential drainage Electronic Signature(s) Signed: 11/12/2021 2:23:32 PM By: Kalman Shan DO Entered By: Kalman Shan on 11/12/2021 12:35:44 -------------------------------------------------------------------------------- HxROS Details Patient Name: Date of Service: [name] Shutter D. 11/12/2021 10:15 A M  Medical Record Number: [id number] Patient Account Number: [id number] Date of Birth/Sex: Treating RN: 06/24/1965 (56 y.o. [name] Primary Care Provider: Jenna Luo Other Clinician: Referring Provider: Treating Provider/Extender: Michel Bickers in Treatment: 16 Information Obtained From Patient Constitutional Symptoms (General Health) Medical History: Past Medical History Notes: torn right shoulder rotator cuff. Eyes Medical History: Negative for: Cataracts; Glaucoma; Optic Neuritis Ear/Nose/Mouth/Throat Medical History: Negative for: Chronic sinus problems/congestion; Middle ear problems Hematologic/Lymphatic Medical History: Negative for: Anemia; Hemophilia; Human Immunodeficiency Virus; Lymphedema; Sickle  Cell Disease Respiratory Medical History: Positive for: Asthma; Sleep Apnea Negative for: Aspiration; Chronic Obstructive Pulmonary Disease (COPD); Pneumothorax; Tuberculosis Cardiovascular Medical History: Positive for: Hypertension Negative for: Angina; Arrhythmia; Congestive Heart Failure; Coronary Artery Disease; Deep Vein Thrombosis; Hypotension; Myocardial Infarction; Peripheral Arterial Disease; Peripheral Venous Disease; Phlebitis; Vasculitis Gastrointestinal Medical History: Negative for: Cirrhosis ; Colitis; Crohns; Hepatitis A; Hepatitis B; Hepatitis C Past Medical History Notes: Hx of GERD Endocrine Medical History: Negative for: Type I Diabetes; Type II Diabetes Genitourinary Medical History: Negative for: End Stage Renal Disease Immunological Medical History: Negative for: Lupus Erythematosus; Raynauds; Scleroderma Integumentary (Skin) Medical History: Negative for: History of Burn Musculoskeletal Medical History: Positive for: Osteoarthritis Negative for: Gout; Rheumatoid Arthritis; Osteomyelitis Neurologic Medical History: Positive for: Neuropathy; Seizure Disorder - hx of seizures r/t tramadol Negative for: Dementia; Quadriplegia; Paraplegia Oncologic Medical History: Negative for: Received Chemotherapy; Received Radiation Psychiatric Medical History: Negative for: Anorexia/bulimia; Confinement Anxiety Past Medical History Notes: hx of bipolar 1 disorder, anxiety, depression. Immunizations Pneumococcal Vaccine: Received Pneumococcal Vaccination: Yes Received Pneumococcal Vaccination On or After 60th Birthday: No Immunization Notes: pt. doesn't remember last tetanus shot Implantable Devices None Hospitalization / Surgery History Type of Hospitalization/Surgery trasmet amp right foot left 4th toe amp neck surgery spine surgery abdom. hysterectomy appendectomy lap. knee surgery Family and Social History Cancer: Yes - Paternal Grandparents;  Diabetes: Yes - Mother,Maternal Grandparents; Heart Disease: Yes - Mother,Paternal Grandparents,Maternal Grandparents,Father,Siblings; Hereditary Spherocytosis: No; Hypertension: Yes - Mother,Father; Kidney Disease: No; Lung Disease: Yes - Mother; Seizures: No; Stroke: No; Thyroid Problems: No; Tuberculosis: No; Never smoker; Marital Status - Widowed; Alcohol Use: Never; Drug Use: Prior History; Caffeine Use: Daily - soda's; Financial Concerns: No; Food, Clothing or Shelter Needs: No; Support System Lacking: No; Transportation Concerns: No Electronic Signature(s) Signed: 11/12/2021 2:23:32 PM By: Kalman Shan DO Signed: 11/12/2021 4:18:51 PM By: Lorrin Jackson Entered By: Kalman Shan on 11/12/2021 12:33:55 -------------------------------------------------------------------------------- Total Contact Cast Details Patient Name: Date of Service: [name] Shutter D. 11/12/2021 10:15 A M Medical Record Number: [id number] Patient Account Number: [id number] Date of Birth/Sex: Treating RN: 08/09/1965 (56 y.o. [name] Primary Care Provider: Jenna Luo Other Clinician: Referring Provider: Treating Provider/Extender: Michel Bickers in Treatment: 40 T Contact Cast Applied for Wound Assessment: otal Wound #3 Right,Plantar Foot Performed By: Physician Kalman Shan, DO Post Procedure Diagnosis Same as Pre-procedure Electronic Signature(s) Signed: 11/12/2021 2:23:32 PM By: Kalman Shan DO Signed: 11/12/2021 3:54:56 PM By: Erenest Blank Entered By: Erenest Blank on 11/12/2021 11:05:07 -------------------------------------------------------------------------------- SuperBill Details Patient Name: Date of Service: [name] 11/12/2021 Medical Record Number: [id number] Patient Account Number: [id number] Date of Birth/Sex: Treating RN: Jul 21, 1965 (56 y.o. [name] Primary Care Provider: Jenna Luo Other Clinician: Referring  Provider: Treating Provider/Extender: Michel Bickers in Treatment: 16 Diagnosis Coding ICD-10 Codes Code Description [phone] Non-pressure chronic ulcer of other part of right foot with fat layer exposed E11.621 Type 2 diabetes mellitus with foot ulcer G90.09 Other idiopathic  peripheral autonomic neuropathy Facility Procedures CPT4 Code: [id number] Description: [id number] - APPLY TOTAL CONTACT LEG CAST ICD-10 Diagnosis Description L97.512 Non-pressure chronic ulcer of other part of right foot with fat layer exposed Modifier: Quantity: 1 Physician Procedures : CPT4 Code Description Modifier [id number] - WC PHYS APPLY TOTAL CONTACT CAST ICD-10 Diagnosis Description L97.512 Non-pressure chronic ulcer of other part of right foot with fat layer exposed Quantity: 1 Electronic Signature(s) Signed: 11/12/2021 2:23:32 PM By: Kalman Shan DO Entered By: Kalman Shan on 11/12/2021 12:35:51

## 2021-11-12 NOTE — Progress Notes (Signed)
ASLAN, MONTAGNA ([id number]) Visit Report for 11/12/2021 Arrival Information Details Patient Name: Date of Service: [name] [name], [name] [name] 11/12/2021 10:15 A M Medical Record Number: [id number] Patient Account Number: [id number] Date of Birth/Sex: Treating RN: 26-Sep-1965 (55 y.o. [name] Primary Care Orion Vandervort: Jenna Luo Other Clinician: Referring Aubrionna Istre: Treating Eulalie Speights/Extender: Michel Bickers in Treatment: 11 Visit Information History Since Last Visit Added or deleted any medications: No Patient Arrived: [name] Any new allergies or adverse reactions: No Arrival Time: 10:40 Had a fall or experienced change in No Accompanied By: self activities of daily living that may affect Transfer Assistance: None risk of falls: Patient Identification Verified: Yes Signs or symptoms of abuse/neglect since last No Secondary Verification Process Completed: Yes visito Patient Requires Transmission-Based Precautions: No Hospitalized since last visit: No Patient Has Alerts: No Implantable device outside of the clinic No excluding cellular tissue based products placed in the center since last visit: Has Dressing in Place as Prescribed: Yes Has Footwear/Offloading in Place as Yes Prescribed: Right: Removable Cast Walker/Walking Boot Pain Present Now: Yes Electronic Signature(s) Signed: 11/12/2021 3:54:56 PM By: Erenest Blank Entered By: Erenest Blank on 11/12/2021 10:40:46 -------------------------------------------------------------------------------- Encounter Discharge Information Details Patient Name: Date of Service: [name] Shutter D. 11/12/2021 10:15 A M Medical Record Number: [id number] Patient Account Number: [id number] Date of Birth/Sex: Treating RN: [birth year] (56 y.o. [name] Primary Care Reginald Mangels: Jenna Luo Other Clinician: Referring Lakisha Peyser: Treating Jozelynn Danielson/Extender: Michel Bickers in  Treatment: 16 Encounter Discharge Information Items Discharge Condition: Stable Ambulatory Status: Cane Discharge Destination: Home Transportation: Private Auto Schedule Follow-up Appointment: Yes Clinical Summary of Care: Provided on 11/12/2021 Form Type Recipient Paper Patient Patient Electronic Signature(s) Signed: 11/12/2021 11:29:41 AM By: Lorrin Jackson Entered By: Lorrin Jackson on 11/12/2021 11:29:41 -------------------------------------------------------------------------------- Lower Extremity Assessment Details Patient Name: Date of Service: [name] [name] 11/12/2021 10:15 A M Medical Record Number: [id number] Patient Account Number: [id number] Date of Birth/Sex: Treating RN: Jul 27, 1965 (56 y.o. [name] Primary Care Calysta Craigo: Jenna Luo Other Clinician: Referring Dashana Guizar: Treating Larrie Lucia/Extender: Michel Bickers in Treatment: 16 Edema Assessment Assessed: Shirlyn Goltz: No] Patrice Paradise: No] Edema: [Left: N] [Right: o] Calf Left: Right: Point of Measurement: 26 cm From Medial Instep 40 cm Ankle Left: Right: Point of Measurement: 10 cm From Medial Instep 25.1 cm Electronic Signature(s) Signed: 11/12/2021 3:54:56 PM By: Erenest Blank Signed: 11/12/2021 4:18:51 PM By: Lorrin Jackson Entered By: Erenest Blank on 11/12/2021 10:51:33 -------------------------------------------------------------------------------- Multi Wound Chart Details Patient Name: Date of Service: [name] Shutter D. 11/12/2021 10:15 A M Medical Record Number: [id number] Patient Account Number: [id number] Date of Birth/Sex: Treating RN: May 02, 1965 (56 y.o. [name] Primary Care Christene Pounds: Jenna Luo Other Clinician: Referring Birdella Sippel: Treating Dorman Calderwood/Extender: Michel Bickers in Treatment: 16 Vital Signs Height(in): 75 Pulse(bpm): 18 Weight(lbs): 228 Blood Pressure(mmHg): 114/77 Body Mass Index(BMI):  39.1 Temperature(F): 97.8 Respiratory Rate(breaths/min): 18 Photos: [N/A:N/A] Right, Plantar Foot N/A N/A Wound Location: Gradually Appeared N/A N/A Wounding Event: Neuropathic Ulcer-Non Diabetic N/A N/A Primary Etiology: Asthma, Sleep Apnea, Hypertension, N/A N/A Comorbid History: Osteoarthritis, Neuropathy, Seizure Disorder 11/05/2021 N/A N/A Date Acquired: 1 N/A N/A Weeks of Treatment: Open N/A N/A Wound Status: No N/A N/A Wound Recurrence: 0.2x0.2x0.1 N/A N/A Measurements L x W x D (cm) 0.031 N/A N/A A (cm) : rea 0.003 N/A N/A Volume (cm) : 67.00% N/A N/A % Reduction in Area: 66.70% N/A N/A % Reduction in Volume: Full Thickness Without Exposed N/A N/A Classification: Support Structures Medium N/A  N/A Exudate Amount: Serosanguineous N/A N/A Exudate Type: red, brown N/A N/A Exudate Color: Distinct, outline attached N/A N/A Wound Margin: Large (67-100%) N/A N/A Granulation Amount: Pink N/A N/A Granulation Quality: None Present (0%) N/A N/A Necrotic Amount: Fat Layer (Subcutaneous Tissue): Yes N/A N/A Exposed Structures: Fascia: No Tendon: No Muscle: No Joint: No Bone: No None N/A N/A Epithelialization: T Contact Cast otal N/A N/A Procedures Performed: Treatment Notes Wound #3 (Foot) Wound Laterality: Plantar, Right Cleanser Soap and Water Discharge Instruction: May shower and wash wound with dial antibacterial soap and water prior to dressing change. Wound Cleanser Discharge Instruction: Cleanse the wound with wound cleanser prior to applying a clean dressing using gauze sponges, not tissue or cotton balls. Peri-Wound Care Topical Primary Dressing KerraCel Ag Gelling Fiber Dressing, 2x2 in (silver alginate) Discharge Instruction: Apply silver alginate to wound bed as instructed Secondary Dressing Woven Gauze Sponges 2x2 in Discharge Instruction: Apply over primary dressing as directed. Secured With 93M Medipore H Soft Cloth Surgical T  ape, 4 x 10 (in/yd) Discharge Instruction: Secure with tape as directed. Compression Wrap Compression Stockings Add-Ons Electronic Signature(s) Signed: 11/12/2021 2:23:32 PM By: Kalman Shan DO Signed: 11/12/2021 4:18:51 PM By: Fara Chute By: Kalman Shan on 11/12/2021 12:33:17 -------------------------------------------------------------------------------- Multi-Disciplinary Care Plan Details Patient Name: Date of Service: [name] Shutter D. 11/12/2021 10:15 A M Medical Record Number: [id number] Patient Account Number: [id number] Date of Birth/Sex: Treating RN: [birth year] (56 y.o. [name] Primary Care Dandra Velardi: Jenna Luo Other Clinician: Referring Raekwon Winkowski: Treating Kayelee Herbig/Extender: Michel Bickers in Treatment: 16 Active Inactive Wound/Skin Impairment Nursing Diagnoses: Impaired tissue integrity Goals: Patient/caregiver will verbalize understanding of skin care regimen Date Initiated: 07/23/2021 Date Inactivated: 10/22/2021 Target Resolution Date: 11/03/2021 Goal Status: Met Ulcer/skin breakdown will have a volume reduction of 30% by week 4 Date Initiated: 07/23/2021 Target Resolution Date: 12/03/2021 Goal Status: Active Ulcer/skin breakdown will have a volume reduction of 50% by week 8 Date Initiated: 09/08/2021 Date Inactivated: 10/06/2021 Target Resolution Date: 10/05/2021 Goal Status: Met Interventions: Assess patient/caregiver ability to obtain necessary supplies Assess patient/caregiver ability to perform ulcer/skin care regimen upon admission and as needed Assess ulceration(s) every visit Provide education on ulcer and skin care Treatment Activities: Topical wound management initiated : 07/23/2021 Notes: 09/08/21: Wound greater than 30% volume reduction. Wound care regimen continues. 10/22/21: Healed Electronic Signature(s) Signed: 11/12/2021 3:54:56 PM By: Erenest Blank Signed: 11/12/2021 4:18:51 PM By: Lorrin Jackson Entered By: Erenest Blank on 11/12/2021 10:57:25 -------------------------------------------------------------------------------- Pain Assessment Details Patient Name: Date of Service: [name] Shutter D. 11/12/2021 10:15 A M Medical Record Number: [id number] Patient Account Number: [id number] Date of Birth/Sex: Treating RN: 01-29-1966 (56 y.o. [name] Primary Care Amore Ackman: Jenna Luo Other Clinician: Referring Norma Montemurro: Treating Mercades Bajaj/Extender: Michel Bickers in Treatment: 16 Active Problems Location of Pain Severity and Description of Pain Patient Has Paino Yes Site Locations Pain Location: Pain Location: Generalized Pain Rate the pain. Current Pain Level: 6 Pain Management and Medication Current Pain Management: Electronic Signature(s) Signed: 11/12/2021 3:54:56 PM By: Erenest Blank Signed: 11/12/2021 4:18:51 PM By: Lorrin Jackson Entered By: Erenest Blank on 11/12/2021 10:40:57 -------------------------------------------------------------------------------- Patient/Caregiver Education Details Patient Name: Date of Service: [name] [name] 7/27/2023andnbsp10:15 A M Medical Record Number: [id number] Patient Account Number: [id number] Date of Birth/Gender: Treating RN: [birth year] (56 y.o. [name] Primary Care Physician: Jenna Luo Other Clinician: Referring Physician: Treating Physician/Extender: Michel Bickers in Treatment: 16 Education Assessment Education Provided To: Patient Education Topics Provided  Offloading: Methods: Explain/Verbal, Printed Responses: State content correctly Wound/Skin Impairment: Methods: Explain/Verbal, Printed Responses: State content correctly Electronic Signature(s) Signed: 11/12/2021 3:54:56 PM By: Erenest Blank Entered By: Erenest Blank on 11/12/2021  10:57:43 -------------------------------------------------------------------------------- Wound Assessment Details Patient Name: Date of Service: [name] Shutter D. 11/12/2021 10:15 A M Medical Record Number: [id number] Patient Account Number: [id number] Date of Birth/Sex: Treating RN: [birth year] (56 y.o. [name] Primary Care Alissia Lory: Jenna Luo Other Clinician: Referring Merrillyn Ackerley: Treating Alichia Alridge/Extender: Michel Bickers in Treatment: 16 Wound Status Wound Number: 3 Primary Neuropathic Ulcer-Non Diabetic Etiology: Wound Location: Right, Plantar Foot Wound Open Wounding Event: Gradually Appeared Status: Date Acquired: 11/05/2021 Comorbid Asthma, Sleep Apnea, Hypertension, Osteoarthritis, Weeks Of Treatment: 1 History: Neuropathy, Seizure Disorder Clustered Wound: No Photos Wound Measurements Length: (cm) 0.2 Width: (cm) 0.2 Depth: (cm) 0.1 Area: (cm) 0.031 Volume: (cm) 0.003 % Reduction in Area: 67% % Reduction in Volume: 66.7% Epithelialization: None Tunneling: No Undermining: No Wound Description Classification: Full Thickness Without Exposed Support Structures Wound Margin: Distinct, outline attached Exudate Amount: Medium Exudate Type: Serosanguineous Exudate Color: red, brown Foul Odor After Cleansing: No Slough/Fibrino No Wound Bed Granulation Amount: Large (67-100%) Exposed Structure Granulation Quality: Pink Fascia Exposed: No Necrotic Amount: None Present (0%) Fat Layer (Subcutaneous Tissue) Exposed: Yes Tendon Exposed: No Muscle Exposed: No Joint Exposed: No Bone Exposed: No Treatment Notes Wound #3 (Foot) Wound Laterality: Plantar, Right Cleanser Soap and Water Discharge Instruction: May shower and wash wound with dial antibacterial soap and water prior to dressing change. Wound Cleanser Discharge Instruction: Cleanse the wound with wound cleanser prior to applying a clean dressing using gauze sponges,  not tissue or cotton balls. Peri-Wound Care Topical Primary Dressing KerraCel Ag Gelling Fiber Dressing, 2x2 in (silver alginate) Discharge Instruction: Apply silver alginate to wound bed as instructed Secondary Dressing Woven Gauze Sponges 2x2 in Discharge Instruction: Apply over primary dressing as directed. Secured With 51M Medipore H Soft Cloth Surgical T ape, 4 x 10 (in/yd) Discharge Instruction: Secure with tape as directed. Compression Wrap Compression Stockings Add-Ons Electronic Signature(s) Signed: 11/12/2021 4:18:51 PM By: Lorrin Jackson Entered By: Lorrin Jackson on 11/12/2021 10:59:00 -------------------------------------------------------------------------------- Vitals Details Patient Name: Date of Service: [name] Shutter D. 11/12/2021 10:15 A M Medical Record Number: [id number] Patient Account Number: [id number] Date of Birth/Sex: Treating RN: [birth year] (56 y.o. [name] Primary Care Khalil Belote: Jenna Luo Other Clinician: Referring Jaydence Arnesen: Treating Garnetta Fedrick/Extender: Michel Bickers in Treatment: 16 Vital Signs Time Taken: 10:41 Temperature (F): 97.8 Height (in): 64 Pulse (bpm): 75 Weight (lbs): 228 Respiratory Rate (breaths/min): 18 Body Mass Index (BMI): 39.1 Blood Pressure (mmHg): 114/77 Reference Range: 80 - 120 mg / dl Electronic Signature(s) Signed: 11/12/2021 3:54:56 PM By: Erenest Blank Entered By: Erenest Blank on 11/12/2021 10:43:37

## 2021-11-17 ENCOUNTER — Encounter (HOSPITAL_BASED_OUTPATIENT_CLINIC_OR_DEPARTMENT_OTHER): Payer: Medicare Other | Attending: General Surgery | Admitting: Internal Medicine

## 2021-11-17 DIAGNOSIS — J45909 Unspecified asthma, uncomplicated: Secondary | ICD-10-CM | POA: Diagnosis not present

## 2021-11-17 DIAGNOSIS — R7303 Prediabetes: Secondary | ICD-10-CM | POA: Insufficient documentation

## 2021-11-17 DIAGNOSIS — G9009 Other idiopathic peripheral autonomic neuropathy: Secondary | ICD-10-CM | POA: Insufficient documentation

## 2021-11-17 DIAGNOSIS — Z89422 Acquired absence of other left toe(s): Secondary | ICD-10-CM | POA: Diagnosis not present

## 2021-11-17 DIAGNOSIS — E11621 Type 2 diabetes mellitus with foot ulcer: Secondary | ICD-10-CM

## 2021-11-17 DIAGNOSIS — L97512 Non-pressure chronic ulcer of other part of right foot with fat layer exposed: Secondary | ICD-10-CM | POA: Insufficient documentation

## 2021-11-17 DIAGNOSIS — G629 Polyneuropathy, unspecified: Secondary | ICD-10-CM | POA: Insufficient documentation

## 2021-11-17 DIAGNOSIS — Z89431 Acquired absence of right foot: Secondary | ICD-10-CM | POA: Diagnosis not present

## 2021-11-17 DIAGNOSIS — G4730 Sleep apnea, unspecified: Secondary | ICD-10-CM | POA: Diagnosis not present

## 2021-11-17 NOTE — Progress Notes (Signed)
[name] [name], [name] [name] ([id number]) Visit Report for 11/17/2021 Arrival Information Details Patient Name: Date of Service: [name] [name], [name] [name] 11/17/2021 9:30 A M Medical Record Number: [id number] Patient Account Number: [id number] Date of Birth/Sex: Treating RN: 01-Aug-1965 (56 y.o. [name] [name] Primary Care [name] [name]: [name] [name] Other Clinician: Referring [name] [name]: Treating [name] [name]/Extender: [name] [name] in Treatment: 49 Visit Information History Since Last Visit Added or deleted any medications: No Patient Arrived: [name] [name] Any new allergies or adverse reactions: No Arrival Time: 09:19 Had a fall or experienced change in No Accompanied By: self activities of daily living that may affect Transfer Assistance: None risk of falls: Patient Identification Verified: Yes Signs or symptoms of abuse/neglect since last No Secondary Verification Process Completed: Yes visito Patient Requires Transmission-Based Precautions: No Hospitalized since last visit: No Patient Has Alerts: No Implantable device outside of the clinic No excluding cellular tissue based products placed in the center since last visit: Has Dressing in Place as Prescribed: Yes Has Footwear/Offloading in Place as Yes Prescribed: Right: Removable Cast Walker/Walking Boot Pain Present Now: Yes Electronic Signature(s) Signed: 11/17/2021 4:26:53 PM By: [name] [name] Entered By: [name] [name] on 11/17/2021 09:21:09 -------------------------------------------------------------------------------- Encounter Discharge Information Details Patient Name: Date of Service: [name] Shutter D. 11/17/2021 9:30 A M Medical Record Number: [id number] Patient Account Number: [id number] Date of Birth/Sex: Treating RN: December 24, 1965 (56 y.o. [name] [name] Primary Care [name] [name]: [name] [name] Other Clinician: Referring Bellamy Rubey: Treating [name] [name]/Extender: [name] [name] in Treatment:  16 Encounter Discharge Information Items Discharge Condition: Stable Ambulatory Status: Cane Discharge Destination: Home Transportation: Private Auto Schedule Follow-up Appointment: Yes Clinical Summary of Care: Provided on 11/17/2021 Form Type Recipient Paper Patient Patient Electronic Signature(s) Signed: 11/17/2021 4:16:13 PM By: [name] [name] Entered By: [name] [name] on 11/17/2021 09:57:44 -------------------------------------------------------------------------------- Lower Extremity Assessment Details Patient Name: Date of Service: [name] Shutter D. 11/17/2021 9:30 A M Medical Record Number: [id number] Patient Account Number: [id number] Date of Birth/Sex: Treating RN: [birth year] (56 y.o. [name] [name] Primary Care [name] [name]: [name] [name] Other Clinician: Referring [name] [name]: Treating [name] [name]/Extender: [name] [name] in Treatment: 16 Edema Assessment Assessed: [name] [name]: No] [name] [name]: No] Edema: [Left: N] [Right: o] Calf Left: Right: Point of Measurement: 26 cm From Medial Instep 38.5 cm Ankle Left: Right: Point of Measurement: 10 cm From Medial Instep 24 cm Electronic Signature(s) Signed: 11/17/2021 4:16:13 PM By: [name] [name] Signed: 11/17/2021 4:26:53 PM By: [name] [name] Entered By: [name] [name] on 11/17/2021 09:26:09 -------------------------------------------------------------------------------- Multi Wound Chart Details Patient Name: Date of Service: [name] Shutter D. 11/17/2021 9:30 A M Medical Record Number: [id number] Patient Account Number: [id number] Date of Birth/Sex: Treating RN: May 15, 1965 (56 y.o. [name] [name] Primary Care Lakeita Panther: [name] [name] Other Clinician: Referring [name] [name]: Treating [name] [name]/Extender: [name] [name] in Treatment: 16 Vital Signs Height(in): 54 Pulse(bpm): 76 Weight(lbs): 228 Blood Pressure(mmHg): 130/84 Body Mass Index(BMI): 39.1 Temperature(F):  97.6 Respiratory Rate(breaths/min): 18 Photos: [[name] [name]:[name] [name]] Right, Plantar Foot [name] [name] [name] [name] Wound Location: Gradually Appeared [name] [name] [name] [name] Wounding Event: Neuropathic Ulcer-Non Diabetic [name] [name] [name] [name] Primary Etiology: Asthma, Sleep Apnea, Hypertension, [name] [name] [name] [name] Comorbid History: Osteoarthritis, Neuropathy, Seizure Disorder 11/05/2021 [name] [name] [name] [name] Date Acquired: 1 [name] [name] [name] [name] Weeks of Treatment: Open [name] [name] [name] [name] Wound Status: No [name] [name] [name] [name] Wound Recurrence: 0x0x0 [name] [name] [name] [name] Measurements L x W x D (cm) 0 [name] [name] [name] [name] A (cm) : rea 0 [name] [name] [name] [name] Volume (cm) : 100.00% [name] [name] [name] [name] % Reduction in Area: 100.00% [name] [name] [name] [name] % Reduction in Volume: Full Thickness Without Exposed [name] [name] [name] [name] Classification: Support Structures None Present  [name] [name] [name] [name] Exudate Amount: Distinct, outline attached [name] [name] [name] [name] Wound Margin: None Present (0%) [name] [name] [name] [name] Granulation Amount: None Present (0%) [name] [name] [name] [name] Necrotic Amount: Fascia: No [name] [name] [name] [name] Exposed Structures: Fat Layer (Subcutaneous Tissue): No Tendon: No Muscle: No Joint: No Bone: No Large (67-100%) [name] [name] [name] [name] Epithelialization: T Contact Cast otal [name] [name] [name] [name] Procedures Performed: Treatment Notes Wound #3 (Foot) Wound Laterality: Plantar, Right Cleanser Soap and Water Discharge Instruction: May shower and wash wound with dial antibacterial soap and water prior to dressing change. Wound Cleanser Discharge Instruction: Cleanse the wound with wound cleanser prior to applying a clean dressing using gauze sponges, not tissue or cotton balls. Peri-Wound Care Topical Primary Dressing KerraCel Ag Gelling Fiber Dressing, 2x2 in (silver alginate) Discharge Instruction: Apply silver alginate to wound bed as instructed Secondary Dressing Woven Gauze Sponges 2x2 in Discharge Instruction: Apply over primary dressing as directed. Secured With 6M Medipore H Soft Cloth Surgical T ape, 4 x 10 (in/yd) Discharge Instruction: Secure with tape as directed. Compression Wrap Compression  Stockings Add-Ons Electronic Signature(s) Signed: 11/17/2021 10:20:20 AM By: [name] [name] Signed: 11/17/2021 4:16:13 PM By: [name] [name] By: [name] [name] on 11/17/2021 10:12:06 -------------------------------------------------------------------------------- Multi-Disciplinary Care Plan Details Patient Name: Date of Service: [name] Shutter D. 11/17/2021 9:30 A M Medical Record Number: [id number] Patient Account Number: [id number] Date of Birth/Sex: Treating RN: 23-Oct-1965 (57 y.o. [name] [name] Primary Care Tremeka Helbling: Other Clinician: Jenna [name] Referring Jackie Russman: Treating Montario Zilka/Extender: [name] [name] in Treatment: 16 Active Inactive Wound/Skin Impairment Nursing Diagnoses: Impaired tissue integrity Goals: Patient/caregiver will verbalize understanding of skin care regimen Date Initiated: 07/23/2021 Date Inactivated: 10/22/2021 Target Resolution Date: 11/03/2021 Goal Status: Met Ulcer/skin breakdown will have a volume reduction of 30% by week 4 Date Initiated: 07/23/2021 Target Resolution Date: 12/03/2021 Goal Status: Active Ulcer/skin breakdown will have a volume reduction of 50% by week 8 Date Initiated: 09/08/2021 Date Inactivated: 10/06/2021 Target Resolution Date: 10/05/2021 Goal Status: Met Interventions: Assess patient/caregiver ability to obtain necessary supplies Assess patient/caregiver ability to perform ulcer/skin care regimen upon admission and as needed Assess ulceration(s) every visit Provide education on ulcer and skin care Treatment Activities: Topical wound management initiated : 07/23/2021 Notes: 09/08/21: Wound greater than 30% volume reduction. Wound care regimen continues. 10/22/21: Healed Electronic Signature(s) Signed: 11/17/2021 4:16:13 PM By: [name] [name] Entered By: [name] [name] on 11/17/2021 09:41:49 -------------------------------------------------------------------------------- Pain Assessment  Details Patient Name: Date of Service: [name] Shutter D. 11/17/2021 9:30 A M Medical Record Number: [id number] Patient Account Number: [id number] Date of Birth/Sex: Treating RN: January 01, 1966 (56 y.o. [name] [name] Primary Care Aydeen Blume: [name] [name] Other Clinician: Referring Mckyle Solanki: Treating Eliab Closson/Extender: [name] [name] in Treatment: 16 Active Problems Location of Pain Severity and Description of Pain Patient Has Paino Yes Site Locations Pain Location: Pain Location: Generalized Pain Rate the pain. Current Pain Level: 5 Pain Management and Medication Current Pain Management: Electronic Signature(s) Signed: 11/17/2021 4:16:13 PM By: [name] [name] Signed: 11/17/2021 4:26:53 PM By: [name] [name] Entered By: [name] [name] on 11/17/2021 09:22:04 -------------------------------------------------------------------------------- Patient/Caregiver Education Details Patient Name: Date of Service: [name] [name] 8/1/2023andnbsp9:30 A M Medical Record Number: [id number] Patient Account Number: [id number] Date of Birth/Gender: Treating RN: 05-25-1965 (56 y.o. [name] [name] Primary Care Physician: [name] [name] Other Clinician: Referring Physician: Treating Physician/Extender: [name] [name] in Treatment: 16 Education Assessment Education Provided To: Patient Education Topics Provided Offloading: Methods: Explain/Verbal, Printed Responses: State content correctly Wound/Skin Impairment: Methods: Explain/Verbal, Printed Responses: State content correctly [employer]) Signed:  11/17/2021 4:16:13 PM By: [name] [name] Entered By: [name] [name] on 11/17/2021 09:42:08 -------------------------------------------------------------------------------- Wound Assessment Details Patient Name: Date of Service: [name] Shutter D. 11/17/2021 9:30 A M Medical Record Number: [id number] Patient Account Number:  [id number] Date of Birth/Sex: Treating RN: 08/31/1965 (56 y.o. [name] [name] Primary Care Yee Joss: [name] [name] Other Clinician: Referring Derwood Becraft: Treating Junia Nygren/Extender: [name] [name] in Treatment: 16 Wound Status Wound Number: 3 Primary Neuropathic Ulcer-Non Diabetic Etiology: Wound Location: Right, Plantar Foot Wound Open Wounding Event: Gradually Appeared Status: Date Acquired: 11/05/2021 Comorbid Asthma, Sleep Apnea, Hypertension, Osteoarthritis, Weeks Of Treatment: 1 History: Neuropathy, Seizure Disorder Clustered Wound: No Photos Wound Measurements Length: (cm) Width: (cm) Depth: (cm) Area: (cm) Volume: (cm) 0 % Reduction in Area: 100% 0 % Reduction in Volume: 100% 0 Epithelialization: Large (67-100%) 0 Tunneling: No 0 Undermining: No Wound Description Classification: Full Thickness Without Exposed Support Structures Wound Margin: Distinct, outline attached Exudate Amount: None Present Foul Odor After Cleansing: No Slough/Fibrino No Wound Bed Granulation Amount: None Present (0%) Exposed Structure Necrotic Amount: None Present (0%) Fascia Exposed: No Fat Layer (Subcutaneous Tissue) Exposed: No Tendon Exposed: No Muscle Exposed: No Joint Exposed: No Bone Exposed: No Treatment Notes Wound #3 (Foot) Wound Laterality: Plantar, Right Cleanser Soap and Water Discharge Instruction: May shower and wash wound with dial antibacterial soap and water prior to dressing change. Wound Cleanser Discharge Instruction: Cleanse the wound with wound cleanser prior to applying a clean dressing using gauze sponges, not tissue or cotton balls. Peri-Wound Care Topical Primary Dressing KerraCel Ag Gelling Fiber Dressing, 2x2 in (silver alginate) Discharge Instruction: Apply silver alginate to wound bed as instructed Secondary Dressing Woven Gauze Sponges 2x2 in Discharge Instruction: Apply over primary dressing as directed. Secured  With 65M Medipore H Soft Cloth Surgical T ape, 4 x 10 (in/yd) Discharge Instruction: Secure with tape as directed. Compression Wrap Compression Stockings Add-Ons Electronic Signature(s) Signed: 11/17/2021 4:16:13 PM By: [name] [name] Signed: 11/17/2021 4:26:53 PM By: [name] [name] Entered By: [name] [name] on 11/17/2021 09:26:52 -------------------------------------------------------------------------------- Vitals Details Patient Name: Date of Service: [name] Shutter D. 11/17/2021 9:30 A M Medical Record Number: [id number] Patient Account Number: [id number] Date of Birth/Sex: Treating RN: 1965-12-27 (56 y.o. [name] [name] Primary Care Aws Shere: [name] [name] Other Clinician: Referring Marchele Decock: Treating Rion Schnitzer/Extender: [name] [name] in Treatment: 16 Vital Signs Time Taken: 09:21 Temperature (F): 97.6 Height (in): 64 Pulse (bpm): 73 Weight (lbs): 228 Respiratory Rate (breaths/min): 18 Body Mass Index (BMI): 39.1 Blood Pressure (mmHg): 130/84 Reference Range: 80 - 120 mg / dl Electronic Signature(s) Signed: 11/17/2021 4:26:53 PM By: [name] [name] Entered By: [name] [name] on 11/17/2021 09:21:48

## 2021-11-17 NOTE — Progress Notes (Signed)
[name] [name] ([id number]) Visit Report for 11/17/2021 Chief Complaint Document Details Patient Name: Date of Service: [name] [name], [name] [name] 11/17/2021 9:30 A M Medical Record Number: [id number] Patient Account Number: [id number] Date of Birth/Sex: Treating RN: 1966-02-18 (56 y.o. [name] Primary Care Provider: Jenna Luo Other Clinician: Referring Provider: Treating Provider/Extender: Michel Bickers in Treatment: 16 Information Obtained from: Patient Chief Complaint 06/03/2019; patient is here for review of a wound on her plantar foot transmetatarsal amputation site at roughly the third met head 07/23/2021; patient presents for right plantar foot wound Electronic Signature(s) Signed: 11/17/2021 10:20:20 AM By: Kalman Shan DO Entered By: Kalman Shan on 11/17/2021 10:12:17 -------------------------------------------------------------------------------- HPI Details Patient Name: Date of Service: [name] Shutter D. 11/17/2021 9:30 A M Medical Record Number: [id number] Patient Account Number: [id number] Date of Birth/Sex: Treating RN: 21-Mar-1966 (56 y.o. [name] Primary Care Provider: Jenna Luo Other Clinician: Referring Provider: Treating Provider/Extender: Michel Bickers in Treatment: 16 History of Present Illness HPI Description: ADMISSION 06/02/2020 This is a 56 year old woman who has had problems with wounds on her predominantly plantar right foot for quite a period of time. 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 More recently she has had an area developed in the right mid foot at roughly the third metatarsal head. She says this started as a blister of November. Its not been closing. More recently she has been using Silvadene cream she has a cam boot to offload. I note she walks with a  cane The patient has had arterial studies done in 2019 at which time her ABI in the right was 1.28 with triphasic waveforms noncompressible on the left with triphasic waveform. Her ABI in our clinic today was 1.3 on the right. 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 She had a foot gastrocnemius recession sometime in the fall 2021 2/21; patient arrives today with a wound measuring slightly smaller 3 x 3 mm however there is circumferential area of erythema around this. She does not have any complaints 2/28; plantar wound on the right first transmetatarsal amputation site. Small oval-shaped wound raised skin around the edges. We have been using Hydrofera Blue. She does not have arterial issues 06/23/2020 upon evaluation today patient appears to be doing decently well. With regard to her wound. There is little bit of slough and some hyper granulation. I do not feel like the PolyMem did too well for her. In fact she may do better with some Hydrofera Blue. I know we just switch from that but I feel like that may be a better way to continue currently. 3/15; this is a patient with a wound on the plantar right transmetatarsal amputation site probably the second metatarsal head not 1 July as stated previously we have been using polymen switch to [facility] last week. Wound looks about the same to me. We are going to start her on a total contact cast today we have the boot to fit 3/18; first total contact cast change she tolerated this well 3/31; she comes back in with out total contact cast on for 10 days. In spite of this the small wound over the third metatarsal head of her right transmetatarsal amputation site almost looks the same slightly hyper granulated. Circumference  is not well adhered. We have been using Hydrofera Blue 4/7 she comes back in in her total contact cast. The wound  is smaller epithelializing and looks like it on its way to closing. We have been using Hydrofera Blue over the wound area She has a right transmetatarsal amputation. She tells [place] that she has been to [facility] in the past and they did a modified shoe for her however it did not keep this area epithelialized. She is going to bring that in next week for [place] to look at. It does not sound however that she was really offloading or padding this area at all 4/14; total contact cast. I changed her to calcium alginate today. I think most of the small area is epithelialized although there is clearly not complete epithelialization and even the area that is epithelialized and the small wound is very tiny She shows me her shoe with the insert. It looks as though the insert probably was too large for her transmet it foot allowing it to move back and forward. I can see where the friction was in the blister she describes forming. I have asked her to take this back to [facility] to see if there is anything they can do 4/21; patient presents for her 1 week follow-up. She has been treated with a total contact cast weekly. She has no complaints today. She denies any fever/chills. 4/26; patient presents for 1 week follow-up in total contact cast change. Patient is having knee pain to her right knee due to chronic osteoarthritis. She is hoping to be able to wear her brace over the cast. 5/10; the wound is closed over however still looks vulnerable. 5/17; the wound remains closed surface looks better. She has a previous right transmet. She is going to convert this into a sneaker. She had a forefoot prosthesis for the foot but not sure that that are too much good she is supposed to have taken aback from modification however she can do that now. 6/8; patient returns to our clinic for reopening of the plantar right foot transmetatarsal amputation site. She has slight warmth and erythema to the area.  She would like to try conservative wound care and not do the total contact cast that was she was doing before at this time 10/01/2020 upon evaluation today patient appears to be doing okay in regard to her foot ulcer. Fortunately there does not appear to be any signs of active infection which is great news and overall very pleased with where things stand today. The wound does appear to be doing well and overall appearance wise is significantly improved compared to last week in my opinion based on my review of her pictures today. Overall the patient tells me that she is not having any significant pain which is great news as well. Readmission 07/23/2021 Ms. 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 This is a reoccurring wound for the patient. She has been following with Dr. Earleen Newport for this issue. She had a wound culture that grew Staph aureus sensitive to Bactrim and she is currently on this. She has been using Silvadene to the wound bed. She has a cam walker boot and an offloading scooter that she uses. She currently denies signs of infection. 4/13; this is a  patient that is a type II diabetic complicated by peripheral neuropathy. She has had a previous transmetatarsal amputation of the right foot. She has a new wound on the plantar right foot between the first and second met heads probably close to the lateral part of the first met head. Generally small clean wound however from 7-9 o'clock there is a probing area close to bone. She finished a course of trimethoprim/sulfamethoxazole yesterday for methicillin- resistant Staph aureus in the wound. I believe this was ordered by podiatry she has been using a Dakin's wet-to-dry dressing. She uses a cam boot and a knee scooter. She says she is religious with  this 4/20; patient presents for follow-up. She continues to use Dakin's wet-to-dry dressings without issues. She states she would like to try the total contact cast that she has had this in the past. She currently denies signs of infection. 4/24; patient presents for follow-up. She would like the total contact cast placed today. She has been using Dakin's wet-to-dry dressings without issues. She denies signs of infection. 08-12-2021 patient presents today for obligatory first cast change. This was placed on Monday today is Wednesday. She seems to be doing quite well with using Hydrofera Blue and the overall appearance of the wound bed is excellent. We do need to replace the cast the good news is there is nothing that seems to be rubbing at this point which is awesome. 5/1; patient presents for follow-up. She has tolerated the total contact cast well. She has no issues or complaints today. 5/16; patient presents for follow-up. She states she missed her last week appointment due to having 2 appointments at the same time. She admits to knowing she is not supposed to have the contact cast for over 7 days. She currently denies signs of infection. She has no issues or complaints today. 5/23; patient presents for follow-up. She has no issues or complaints pertaining to the total contact cast. We have been using Hydrofera Blue. She denies signs of infection. 5/30; patient presents for follow-up. She tolerated the total contact cast well over the past week. We have been using Hydrofera Blue under the cast. She has no issues or complaints today. 6/7; total contact cast Hydrofera Blue. Right plantar foot TMA site 6/15; patient presents for follow-up. She has had the total contact cast with Hydrofera Blue for the past week. She has no issues or complaints today. 6/20; patient presents for follow-up. She was without the total contact cast for the past week since the wound appeared closed. She has no issues  or complaints today. 6/27; patient presents for follow-up. She tolerated the total contact cast well. She has no issues or complaints today. The wound appears well-healing. 7/6; patient presents for follow-up. She tolerated the total contact cast well. We have been using Hydrofera Blue under the cast. 7/20; patient presents with reopening of her right plantar foot wound. She states this happened about 1 week ago. She has been keeping the area covered. She denies signs of infection. 7/27; patient presents for follow-up. She tolerated the cast well. The wound is healed. She denies signs of infection. 8/1; patient presents for follow-up. She again tolerated the cast well. The wound was healed at last clinic visit. She has no issues or complaints today. Electronic Signature(s) Signed: 11/17/2021 10:20:20 AM By: Kalman Shan DO Entered By: Kalman Shan on 11/17/2021 10:12:40 -------------------------------------------------------------------------------- Physical Exam Details Patient Name: Date of Service: [name] Shutter D. 11/17/2021 9:30 A M Medical Record Number: [id number] Patient Account  Number: [id number] Date of Birth/Sex: Treating RN: [birth year] (56 y.o. [name] Primary Care Provider: Jenna Luo Other Clinician: Referring Provider: Treating Provider/Extender: Michel Bickers in Treatment: 16 Constitutional respirations regular, non-labored and within target range for patient.. Cardiovascular 2+ dorsalis pedis/posterior tibialis pulses. Psychiatric pleasant and cooperative. Notes Right foot: T the plantar aspect there is epithelization to the previous wound site. o Electronic Signature(s) Signed: 11/17/2021 10:20:20 AM By: Kalman Shan DO Entered By: Kalman Shan on 11/17/2021 10:13:08 -------------------------------------------------------------------------------- Physician Orders Details Patient Name: Date of Service: [name] Shutter D. 11/17/2021 9:30 A M Medical Record Number: [id number] Patient Account Number: [id number] Date of Birth/Sex: Treating RN: 1965-10-17 (56 y.o. [name] Primary Care Provider: Jenna Luo Other Clinician: Referring Provider: Treating Provider/Extender: Michel Bickers in Treatment: [phone] Verbal / Phone Orders: No Diagnosis Coding Follow-up Appointments ppointment in 1 week. - on Tues 11/24/21 @ 11:00am w/ Dr. Heber [place] and Leveda Anna Room # 7 Return A Other: - Call and make Podiatry appt Bathing/ Shower/ Hygiene May shower and wash wound with soap and water. - when changing dressing Edema Control - Lymphedema / SCD / Other Avoid standing for long periods of time. Off-Loading Total Contact Cast to Right Lower Extremity - Right Foot Wound Treatment Wound #3 - Foot Wound Laterality: Plantar, Right Cleanser: Soap and Water 1 x Per Week/30 Days Discharge Instructions: May shower and wash wound with dial antibacterial soap and water prior to dressing change. Cleanser: Wound Cleanser 1 x Per Week/30 Days Discharge Instructions: Cleanse the wound with wound cleanser prior to applying a clean dressing using gauze sponges, not tissue or cotton balls. Prim Dressing: KerraCel Ag Gelling Fiber Dressing, 2x2 in (silver alginate) 1 x Per Week/30 Days ary Discharge Instructions: Apply silver alginate to wound bed as instructed Secondary Dressing: Woven Gauze Sponges 2x2 in 1 x Per Week/30 Days Discharge Instructions: Apply over primary dressing as directed. Secured With: 54M Medipore H Soft Cloth Surgical T ape, 4 x 10 (in/yd) 1 x Per Week/30 Days Discharge Instructions: Secure with tape as directed. Electronic Signature(s) Signed: 11/17/2021 10:20:20 AM By: Kalman Shan DO Entered By: Kalman Shan on 11/17/2021 10:13:36 -------------------------------------------------------------------------------- Problem List Details Patient Name: Date of Service: [name] Shutter D. 11/17/2021 9:30 A M Medical Record Number: [id number] Patient Account Number: [id number] Date of Birth/Sex: Treating RN: [birth year] (56 y.o. [name] Primary Care Provider: Jenna Luo Other Clinician: Referring Provider: Treating Provider/Extender: Michel Bickers in Treatment: 16 Active Problems ICD-10 Encounter Code Description Active Date MDM Diagnosis L97.512 Non-pressure chronic ulcer of other part of right foot with fat layer exposed 07/23/2021 No Yes E11.621 Type 2 diabetes mellitus with foot ulcer 07/23/2021 No Yes G90.09 Other idiopathic peripheral autonomic neuropathy 07/23/2021 No Yes Inactive Problems Resolved Problems Electronic Signature(s) Signed: 11/17/2021 10:20:20 AM By: Kalman Shan DO Entered By: Kalman Shan on 11/17/2021 10:12:01 -------------------------------------------------------------------------------- Progress Note Details Patient Name: Date of Service: [name] Shutter D. 11/17/2021 9:30 A M Medical Record Number: [id number] Patient Account Number: [id number] Date of Birth/Sex: Treating RN: 1965/12/20 (56 y.o. [name] Primary Care Provider: Jenna Luo Other Clinician: Referring Provider: Treating Provider/Extender: Michel Bickers in Treatment: 16 Subjective Chief Complaint Information obtained from Patient 06/03/2019; patient is here for review of a wound on her plantar foot transmetatarsal amputation site at roughly the third met head 07/23/2021; patient presents for right plantar foot wound History of Present Illness (HPI) ADMISSION 06/02/2020 This is a 57 year old woman  who has had problems with wounds on her predominantly plantar right foot for quite a period of time. 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 More recently she has had an area developed  in the right mid foot at roughly the third metatarsal head. She says this started as a blister of November. Its not been closing. More recently she has been using Silvadene cream she has a cam boot to offload. I note she walks with a cane The patient has had arterial studies done in 2019 at which time her ABI in the right was 1.28 with triphasic waveforms noncompressible on the left with triphasic waveform. Her ABI in our clinic today was 1.3 on the right. 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 She had a foot gastrocnemius recession sometime in the fall 2021 2/21; patient arrives today with a wound measuring slightly smaller 3 x 3 mm however there is circumferential area of erythema around this. She does not have any complaints 2/28; plantar wound on the right first transmetatarsal amputation site. Small oval-shaped wound raised skin around the edges. We have been using Hydrofera Blue. She does not have arterial issues 06/23/2020 upon evaluation today patient appears to be doing decently well. With regard to her wound. There is little bit of slough and some hyper granulation. I do not feel like the PolyMem did too well for her. In fact she may do better with some Hydrofera Blue. I know we just switch from that but I feel like that may be a better way to continue currently. 3/15; this is a patient with a wound on the plantar right transmetatarsal amputation site probably the second metatarsal head not 1 July as stated previously we have been using polymen switch to [facility] last week. Wound looks about the same to me. We are going to start her on a total contact cast today we have the boot to fit 3/18; first total contact cast change she tolerated this well 3/31; she comes back in with out total contact cast on for 10 days. In spite of this the small wound over the third  metatarsal head of her right transmetatarsal amputation site almost looks the same slightly hyper granulated. Circumference is not well adhered. We have been using Hydrofera Blue 4/7 she comes back in in her total contact cast. The wound is smaller epithelializing and looks like it on its way to closing. We have been using Hydrofera Blue over the wound area She has a right transmetatarsal amputation. She tells [place] that she has been to [facility] in the past and they did a modified shoe for her however it did not keep this area epithelialized. She is going to bring that in next week for [place] to look at. It does not sound however that she was really offloading or padding this area at all 4/14; total contact cast. I changed her to calcium alginate today. I think most of the small area is epithelialized although there is clearly not complete epithelialization and even the area that is epithelialized and the small wound is very tiny She shows me her shoe with the insert. It looks as though the insert probably was too large for her transmet it foot allowing it to move back  and forward. I can see where the friction was in the blister she describes forming. I have asked her to take this back to [facility] to see if there is anything they can do 4/21; patient presents for her 1 week follow-up. She has been treated with a total contact cast weekly. She has no complaints today. She denies any fever/chills. 4/26; patient presents for 1 week follow-up in total contact cast change. Patient is having knee pain to her right knee due to chronic osteoarthritis. She is hoping to be able to wear her brace over the cast. 5/10; the wound is closed over however still looks vulnerable. 5/17; the wound remains closed surface looks better. She has a previous right transmet. She is going to convert this into a sneaker. She had a forefoot prosthesis for the foot but not sure that that are too much good she is  supposed to have taken aback from modification however she can do that now. 6/8; patient returns to our clinic for reopening of the plantar right foot transmetatarsal amputation site. She has slight warmth and erythema to the area. She would like to try conservative wound care and not do the total contact cast that was she was doing before at this time 10/01/2020 upon evaluation today patient appears to be doing okay in regard to her foot ulcer. Fortunately there does not appear to be any signs of active infection which is great news and overall very pleased with where things stand today. The wound does appear to be doing well and overall appearance wise is significantly improved compared to last week in my opinion based on my review of her pictures today. Overall the patient tells me that she is not having any significant pain which is great news as well. Readmission 07/23/2021 Ms. 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 This is a reoccurring wound for the patient. She has been following with Dr. Earleen Newport for this issue. She had a wound culture that grew Staph aureus sensitive to Bactrim and she is currently on this. She has been using Silvadene to the wound bed. She has a cam walker boot and an offloading scooter that she uses. She currently denies signs of infection. 4/13; this is a patient that is a type II diabetic complicated by peripheral neuropathy. She has had a previous transmetatarsal amputation of the right foot. She has a new wound on the plantar right foot between the first and second met heads probably close to the lateral part of the first met head. Generally small clean wound however from 7-9 o'clock there is a probing area close to bone. She finished a course of trimethoprim/sulfamethoxazole  yesterday for methicillin- resistant Staph aureus in the wound. I believe this was ordered by podiatry she has been using a Dakin's wet-to-dry dressing. She uses a cam boot and a knee scooter. She says she is religious with this 4/20; patient presents for follow-up. She continues to use Dakin's wet-to-dry dressings without issues. She states she would like to try the total contact cast that she has had this in the past. She currently denies signs of infection. 4/24; patient presents for follow-up. She would like the total contact cast placed today. She has been using Dakin's wet-to-dry dressings without issues. She denies signs of infection. 08-12-2021  patient presents today for obligatory first cast change. This was placed on Monday today is Wednesday. She seems to be doing quite well with using Hydrofera Blue and the overall appearance of the wound bed is excellent. We do need to replace the cast the good news is there is nothing that seems to be rubbing at this point which is awesome. 5/1; patient presents for follow-up. She has tolerated the total contact cast well. She has no issues or complaints today. 5/16; patient presents for follow-up. She states she missed her last week appointment due to having 2 appointments at the same time. She admits to knowing she is not supposed to have the contact cast for over 7 days. She currently denies signs of infection. She has no issues or complaints today. 5/23; patient presents for follow-up. She has no issues or complaints pertaining to the total contact cast. We have been using Hydrofera Blue. She denies signs of infection. 5/30; patient presents for follow-up. She tolerated the total contact cast well over the past week. We have been using Hydrofera Blue under the cast. She has no issues or complaints today. 6/7; total contact cast Hydrofera Blue. Right plantar foot TMA site 6/15; patient presents for follow-up. She has had the total contact cast with  Hydrofera Blue for the past week. She has no issues or complaints today. 6/20; patient presents for follow-up. She was without the total contact cast for the past week since the wound appeared closed. She has no issues or complaints today. 6/27; patient presents for follow-up. She tolerated the total contact cast well. She has no issues or complaints today. The wound appears well-healing. 7/6; patient presents for follow-up. She tolerated the total contact cast well. We have been using Hydrofera Blue under the cast. 7/20; patient presents with reopening of her right plantar foot wound. She states this happened about 1 week ago. She has been keeping the area covered. She denies signs of infection. 7/27; patient presents for follow-up. She tolerated the cast well. The wound is healed. She denies signs of infection. 8/1; patient presents for follow-up. She again tolerated the cast well. The wound was healed at last clinic visit. She has no issues or complaints today. Patient History Information obtained from Patient. Family History Cancer - Paternal Grandparents, Diabetes - Mother,Maternal Grandparents, Heart Disease - Mother,Paternal Grandparents,Maternal Grandparents,Father,Siblings, Hypertension - Mother,Father, Lung Disease - Mother, No family history of Hereditary Spherocytosis, Kidney Disease, Seizures, Stroke, Thyroid Problems, Tuberculosis. Social History Never smoker, Marital Status - Widowed, Alcohol Use - Never, Drug Use - Prior History, Caffeine Use - Daily - soda's. Medical History Eyes Denies history of Cataracts, Glaucoma, Optic Neuritis Ear/Nose/Mouth/Throat Denies history of Chronic sinus problems/congestion, Middle ear problems Hematologic/Lymphatic Denies history of Anemia, Hemophilia, Human Immunodeficiency Virus, Lymphedema, Sickle Cell Disease Respiratory Patient has history of Asthma, Sleep Apnea Denies history of Aspiration, Chronic Obstructive Pulmonary Disease (COPD),  Pneumothorax, Tuberculosis Cardiovascular Patient has history of Hypertension Denies history of Angina, Arrhythmia, Congestive Heart Failure, Coronary Artery Disease, Deep Vein Thrombosis, Hypotension, Myocardial Infarction, Peripheral Arterial Disease, Peripheral Venous Disease, Phlebitis, Vasculitis Gastrointestinal Denies history of Cirrhosis , Colitis, Crohnoos, Hepatitis A, Hepatitis B, Hepatitis C Endocrine Denies history of Type I Diabetes, Type II Diabetes Genitourinary Denies history of End Stage Renal Disease Immunological Denies history of Lupus Erythematosus, Raynaudoos, Scleroderma Integumentary (Skin) Denies history of History of Burn Musculoskeletal Patient has history of Osteoarthritis Denies history of Gout, Rheumatoid Arthritis, Osteomyelitis Neurologic Patient has history of Neuropathy, Seizure Disorder - hx  of seizures 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 - left 4th toe amp. - neck surgery. - spine surgery. - abdom. hysterectomy. - appendectomy. - lap. knee surgery. Medical A Surgical History Notes nd Constitutional Symptoms (General Health) torn right shoulder rotator cuff. Gastrointestinal Hx of GERD Psychiatric hx of bipolar 1 disorder, anxiety, depression. Objective Constitutional respirations regular, non-labored and within target range for patient.. Vitals Time Taken: 9:21 AM, Height: 64 in, Weight: 228 lbs, BMI: 39.1, Temperature: 97.6 F, Pulse: 73 bpm, Respiratory Rate: 18 breaths/min, Blood Pressure: 130/84 mmHg. Cardiovascular 2+ dorsalis pedis/posterior tibialis pulses. Psychiatric pleasant and cooperative. General Notes: Right foot: T the plantar aspect there is epithelization to the previous wound  site. o Integumentary (Hair, Skin) Wound #3 status is Open. Original cause of wound was Gradually Appeared. The date acquired was: 11/05/2021. The wound has been in treatment 1 weeks. The wound is located on the [place]. The wound measures 0cm length x 0cm width x 0cm depth; 0cm^2 area and 0cm^3 volume. There is no tunneling or undermining noted. There is a none present amount of drainage noted. The wound margin is distinct with the outline attached to the wound base. There is no granulation within the wound bed. There is no necrotic tissue within the wound bed. Assessment Active Problems ICD-10 Non-pressure chronic ulcer of other part of right foot with fat layer exposed Type 2 diabetes mellitus with foot ulcer Other idiopathic peripheral autonomic neuropathy Patient has done well with the cast. Her wound remains closed. She would like the cast placed 1 more time and I am in agreement to help continue to solidify healing. She has a history of reoccurring wounds to this area. I strongly advised she follow-up with podiatry for orthotics. She will need specialized shoes going forward to prevent future wounds. Procedures Wound #3 Pre-procedure diagnosis of Wound #3 is a Neuropathic Ulcer-Non Diabetic located on the [place] . There was a T Programmer, multimedia Procedure by Cephas Darby, Unique Searfoss, DO. Post procedure Diagnosis Wound #3: Same as Pre-Procedure Plan Follow-up Appointments: Return Appointment in 1 week. - on Tues 11/24/21 @ 11:00am w/ Dr. Heber [place] and Leveda Anna Room # 7 Other: - Call and make Podiatry appt Bathing/ Shower/ Hygiene: May shower and wash wound with soap and water. - when changing dressing Edema Control - Lymphedema / SCD / Other: Avoid standing for long periods of time. Off-Loading: T Contact Cast to Right Lower Extremity - Right Foot otal WOUND #3: - Foot Wound Laterality: Plantar, Right Cleanser: Soap and Water 1 x Per Week/30 Days Discharge Instructions:  May shower and wash wound with dial antibacterial soap and water prior to dressing change. Cleanser: Wound Cleanser 1 x Per Week/30 Days Discharge Instructions: Cleanse the wound with wound cleanser prior to applying a clean dressing using gauze sponges, not tissue or cotton balls. Prim Dressing: KerraCel Ag Gelling Fiber Dressing, 2x2 in (silver alginate) 1 x Per Week/30 Days ary Discharge Instructions: Apply silver alginate to wound bed as instructed Secondary Dressing: Woven Gauze Sponges 2x2 in 1 x Per Week/30 Days Discharge Instructions: Apply over primary dressing as directed. Secured With: 53M Medipore H Soft Cloth Surgical T ape, 4 x 10 (in/yd) 1 x Per Week/30 Days Discharge Instructions: Secure with tape as directed. 1. T contact cast placed in standard fashion otal 2. Follow-up with podiatry for orthotics 3. Follow-up in  1 week Electronic Signature(s) Signed: 11/17/2021 10:20:20 AM By: Kalman Shan DO Entered By: Kalman Shan on 11/17/2021 10:15:10 -------------------------------------------------------------------------------- HxROS Details Patient Name: Date of Service: [name] Shutter D. 11/17/2021 9:30 A M Medical Record Number: [id number] Patient Account Number: [id number] Date of Birth/Sex: Treating RN: [birth year] (56 y.o. [name] Primary Care Provider: Jenna Luo Other Clinician: Referring Provider: Treating Provider/Extender: Michel Bickers in Treatment: 16 Information Obtained From Patient Constitutional Symptoms (General Health) Medical History: Past Medical History Notes: torn right shoulder rotator cuff. Eyes Medical History: Negative for: Cataracts; Glaucoma; Optic Neuritis Ear/Nose/Mouth/Throat Medical History: Negative for: Chronic sinus problems/congestion; Middle ear problems Hematologic/Lymphatic Medical History: Negative for: Anemia; Hemophilia; Human Immunodeficiency Virus; Lymphedema; Sickle Cell  Disease Respiratory Medical History: Positive for: Asthma; Sleep Apnea Negative for: Aspiration; Chronic Obstructive Pulmonary Disease (COPD); Pneumothorax; Tuberculosis Cardiovascular Medical History: Positive for: Hypertension Negative for: Angina; Arrhythmia; Congestive Heart Failure; Coronary Artery Disease; Deep Vein Thrombosis; Hypotension; Myocardial Infarction; Peripheral Arterial Disease; Peripheral Venous Disease; Phlebitis; Vasculitis Gastrointestinal Medical History: Negative for: Cirrhosis ; Colitis; Crohns; Hepatitis A; Hepatitis B; Hepatitis C Past Medical History Notes: Hx of GERD Endocrine Medical History: Negative for: Type I Diabetes; Type II Diabetes Genitourinary Medical History: Negative for: End Stage Renal Disease Immunological Medical History: Negative for: Lupus Erythematosus; Raynauds; Scleroderma Integumentary (Skin) Medical History: Negative for: History of Burn Musculoskeletal Medical History: Positive for: Osteoarthritis Negative for: Gout; Rheumatoid Arthritis; Osteomyelitis Neurologic Medical History: Positive for: Neuropathy; Seizure Disorder - hx of seizures r/t tramadol Negative for: Dementia; Quadriplegia; Paraplegia Oncologic Medical History: Negative for: Received Chemotherapy; Received Radiation Psychiatric Medical History: Negative for: Anorexia/bulimia; Confinement Anxiety Past Medical History Notes: hx of bipolar 1 disorder, anxiety, depression. Immunizations Pneumococcal Vaccine: Received Pneumococcal Vaccination: Yes Received Pneumococcal Vaccination On or After 60th Birthday: No Immunization Notes: pt. doesn't remember last tetanus shot Implantable Devices None Hospitalization / Surgery History Type of Hospitalization/Surgery trasmet amp right foot left 4th toe amp neck surgery spine surgery abdom. hysterectomy appendectomy lap. knee surgery Family and Social History Cancer: Yes - Paternal Grandparents;  Diabetes: Yes - Mother,Maternal Grandparents; Heart Disease: Yes - Mother,Paternal Grandparents,Maternal Grandparents,Father,Siblings; Hereditary Spherocytosis: No; Hypertension: Yes - Mother,Father; Kidney Disease: No; Lung Disease: Yes - Mother; Seizures: No; Stroke: No; Thyroid Problems: No; Tuberculosis: No; Never smoker; Marital Status - Widowed; Alcohol Use: Never; Drug Use: Prior History; Caffeine Use: Daily - soda's; Financial Concerns: No; Food, Clothing or Shelter Needs: No; Support System Lacking: No; Transportation Concerns: No Electronic Signature(s) Signed: 11/17/2021 10:20:20 AM By: Kalman Shan DO Signed: 11/17/2021 4:16:13 PM By: Lorrin Jackson Entered By: Kalman Shan on 11/17/2021 10:12:47 -------------------------------------------------------------------------------- Total Contact Cast Details Patient Name: Date of Service: [name] Shutter D. 11/17/2021 9:30 A M Medical Record Number: [id number] Patient Account Number: [id number] Date of Birth/Sex: Treating RN: [birth year] (56 y.o. [name] Primary Care Provider: Jenna Luo Other Clinician: Referring Provider: Treating Provider/Extender: Michel Bickers in Treatment: 30 T Contact Cast Applied for Wound Assessment: otal Wound #3 Right,Plantar Foot Performed By: Physician Kalman Shan, DO Post Procedure Diagnosis Same as Pre-procedure Electronic Signature(s) Signed: 11/17/2021 10:20:20 AM By: Kalman Shan DO Signed: 11/17/2021 4:16:13 PM By: Lorrin Jackson Entered By: Lorrin Jackson on 11/17/2021 09:40:24 -------------------------------------------------------------------------------- [place] Details Patient Name: Date of Service: [name] 11/17/2021 Medical Record Number: [id number] Patient Account Number: [id number] Date of Birth/Sex: Treating RN: 1965/06/05 (56 y.o. [name] Primary Care Provider: Jenna Luo Other Clinician: Referring  Provider: Treating Provider/Extender: Michel Bickers  in Treatment: 16 Diagnosis Coding ICD-10 Codes Code Description L97.512 Non-pressure chronic ulcer of other part of right foot with fat layer exposed E11.621 Type 2 diabetes mellitus with foot ulcer G90.09 Other idiopathic peripheral autonomic neuropathy Facility Procedures CPT4 Code: [id number] Description: [phone] - APPLY TOTAL CONTACT LEG CAST ICD-10 Diagnosis Description L97.512 Non-pressure chronic ulcer of other part of right foot with fat layer exposed E11.621 Type 2 diabetes mellitus with foot ulcer Modifier: Quantity: 1 Physician Procedures : CPT4 Code Description Modifier [id number] - WC PHYS APPLY TOTAL CONTACT CAST ICD-10 Diagnosis Description L97.512 Non-pressure chronic ulcer of other part of right foot with fat layer exposed E11.621 Type 2 diabetes mellitus with foot ulcer Quantity: 1 Electronic Signature(s) Signed: 11/17/2021 10:20:20 AM By: Kalman Shan DO Entered By: Kalman Shan on 11/17/2021 10:15:23

## 2021-11-18 DIAGNOSIS — M5416 Radiculopathy, lumbar region: Secondary | ICD-10-CM | POA: Diagnosis not present

## 2021-11-23 DIAGNOSIS — M4326 Fusion of spine, lumbar region: Secondary | ICD-10-CM | POA: Diagnosis not present

## 2021-11-23 DIAGNOSIS — M1612 Unilateral primary osteoarthritis, left hip: Secondary | ICD-10-CM | POA: Diagnosis not present

## 2021-11-24 ENCOUNTER — Encounter (HOSPITAL_BASED_OUTPATIENT_CLINIC_OR_DEPARTMENT_OTHER): Payer: Medicare Other | Admitting: Internal Medicine

## 2021-11-24 DIAGNOSIS — E11621 Type 2 diabetes mellitus with foot ulcer: Secondary | ICD-10-CM

## 2021-11-24 DIAGNOSIS — Z89422 Acquired absence of other left toe(s): Secondary | ICD-10-CM | POA: Diagnosis not present

## 2021-11-24 DIAGNOSIS — G629 Polyneuropathy, unspecified: Secondary | ICD-10-CM | POA: Diagnosis not present

## 2021-11-24 DIAGNOSIS — Z89431 Acquired absence of right foot: Secondary | ICD-10-CM | POA: Diagnosis not present

## 2021-11-24 DIAGNOSIS — L97512 Non-pressure chronic ulcer of other part of right foot with fat layer exposed: Secondary | ICD-10-CM

## 2021-11-24 DIAGNOSIS — G9009 Other idiopathic peripheral autonomic neuropathy: Secondary | ICD-10-CM | POA: Diagnosis not present

## 2021-11-24 DIAGNOSIS — J45909 Unspecified asthma, uncomplicated: Secondary | ICD-10-CM | POA: Diagnosis not present

## 2021-11-24 DIAGNOSIS — G4730 Sleep apnea, unspecified: Secondary | ICD-10-CM | POA: Diagnosis not present

## 2021-11-24 DIAGNOSIS — R7303 Prediabetes: Secondary | ICD-10-CM | POA: Diagnosis not present

## 2021-11-24 NOTE — Progress Notes (Signed)
SAIDA, [name] ([id number]) Visit Report for 11/24/2021 Chief Complaint Document Details Patient Name: Date of Service: [name] [name], [name] [name] 11/24/2021 11:00 A M Medical Record Number: [id number] Patient Account Number: [id number] Date of Birth/Sex: Treating RN: 07/03/1965 (56 y.o. [name] Primary Care Provider: Jenna Luo Other Clinician: Referring Provider: Treating Provider/Extender: Michel Bickers in Treatment: 58 Information Obtained from: Patient Chief Complaint 06/03/2019; patient is here for review of a wound on her plantar foot transmetatarsal amputation site at roughly the third met head 07/23/2021; patient presents for right plantar foot wound Electronic Signature(s) Signed: 11/24/2021 11:42:15 AM By: Kalman Shan DO Entered By: Kalman Shan on 11/24/2021 11:33:28 -------------------------------------------------------------------------------- HPI Details Patient Name: Date of Service: [name] Shutter D. 11/24/2021 11:00 A M Medical Record Number: [id number] Patient Account Number: [id number] Date of Birth/Sex: Treating RN: [birth year] (56 y.o. [name] Primary Care Provider: Jenna Luo Other Clinician: Referring Provider: Treating Provider/Extender: Michel Bickers in Treatment: 52 History of Present Illness HPI Description: ADMISSION 06/02/2020 This is a 56 year old woman who has had problems with wounds on her predominantly plantar right foot for quite a period of time. 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 More recently she has had an area developed in the right mid foot at roughly the third metatarsal head. She says this started as a blister of November. Its not been closing. More recently she has been using Silvadene cream she has a cam boot to offload. I note she walks with a  cane The patient has had arterial studies done in 2019 at which time her ABI in the right was 1.28 with triphasic waveforms noncompressible on the left with triphasic waveform. Her ABI in our clinic today was 58.3 on the right. 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 She had a foot gastrocnemius recession sometime in the fall 2021 2/21; patient arrives today with a wound measuring slightly smaller 3 x 3 mm however there is circumferential area of erythema around this. She does not have any complaints 2/28; plantar wound on the right first transmetatarsal amputation site. Small oval-shaped wound raised skin around the edges. We have been using Hydrofera Blue. She does not have arterial issues 06/23/2020 upon evaluation today patient appears to be doing decently well. With regard to her wound. There is little bit of slough and some hyper granulation. I do not feel like the PolyMem did too well for her. In fact she may do better with some Hydrofera Blue. I know we just switch from that but I feel like that may be a better way to continue currently. 3/15; this is a patient with a wound on the plantar right transmetatarsal amputation site probably the second metatarsal head not 1 July as stated previously we have been using polymen switch to [facility] last week. Wound looks about the same to me. We are going to start her on a total contact cast today we have the boot to fit 3/18; first total contact cast change she tolerated this well 3/31; she comes back in with out total contact cast on for 10 days. In spite of this the small wound over the third metatarsal head of her right transmetatarsal amputation site almost looks the same slightly hyper granulated. Circumference  is not well adhered. We have been using Hydrofera Blue 4/7 she comes back in in her total contact cast. The wound  is smaller epithelializing and looks like it on its way to closing. We have been using Hydrofera Blue over the wound area She has a right transmetatarsal amputation. She tells [place] that she has been to [facility] in the past and they did a modified shoe for her however it did not keep this area epithelialized. She is going to bring that in next week for [place] to look at. It does not sound however that she was really offloading or padding this area at all 4/14; total contact cast. I changed her to calcium alginate today. I think most of the small area is epithelialized although there is clearly not complete epithelialization and even the area that is epithelialized and the small wound is very tiny She shows me her shoe with the insert. It looks as though the insert probably was too large for her transmet it foot allowing it to move back and forward. I can see where the friction was in the blister she describes forming. I have asked her to take this back to [facility] to see if there is anything they can do 4/21; patient presents for her 1 week follow-up. She has been treated with a total contact cast weekly. She has no complaints today. She denies any fever/chills. 4/26; patient presents for 1 week follow-up in total contact cast change. Patient is having knee pain to her right knee due to chronic osteoarthritis. She is hoping to be able to wear her brace over the cast. 5/10; the wound is closed over however still looks vulnerable. 5/17; the wound remains closed surface looks better. She has a previous right transmet. She is going to convert this into a sneaker. She had a forefoot prosthesis for the foot but not sure that that are too much good she is supposed to have taken aback from modification however she can do that now. 6/8; patient returns to our clinic for reopening of the plantar right foot transmetatarsal amputation site. She has slight warmth and erythema to the area.  She would like to try conservative wound care and not do the total contact cast that was she was doing before at this time 10/01/2020 upon evaluation today patient appears to be doing okay in regard to her foot ulcer. Fortunately there does not appear to be any signs of active infection which is great news and overall very pleased with where things stand today. The wound does appear to be doing well and overall appearance wise is significantly improved compared to last week in my opinion based on my review of her pictures today. Overall the patient tells me that she is not having any significant pain which is great news as well. Readmission 07/23/2021 Ms. 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 This is a reoccurring wound for the patient. She has been following with Dr. Earleen Newport for this issue. She had a wound culture that grew Staph aureus sensitive to Bactrim and she is currently on this. She has been using Silvadene to the wound bed. She has a cam walker boot and an offloading scooter that she uses. She currently denies signs of infection. 4/13; this is a  patient that is a type II diabetic complicated by peripheral neuropathy. She has had a previous transmetatarsal amputation of the right foot. She has a new wound on the plantar right foot between the first and second met heads probably close to the lateral part of the first met head. Generally small clean wound however from 7-9 o'clock there is a probing area close to bone. She finished a course of trimethoprim/sulfamethoxazole yesterday for methicillin- resistant Staph aureus in the wound. I believe this was ordered by podiatry she has been using a Dakin's wet-to-dry dressing. She uses a cam boot and a knee scooter. She says she is religious with  this 4/20; patient presents for follow-up. She continues to use Dakin's wet-to-dry dressings without issues. She states she would like to try the total contact cast that she has had this in the past. She currently denies signs of infection. 4/24; patient presents for follow-up. She would like the total contact cast placed today. She has been using Dakin's wet-to-dry dressings without issues. She denies signs of infection. 08-12-2021 patient presents today for obligatory first cast change. This was placed on Monday today is Wednesday. She seems to be doing quite well with using Hydrofera Blue and the overall appearance of the wound bed is excellent. We do need to replace the cast the good news is there is nothing that seems to be rubbing at this point which is awesome. 5/1; patient presents for follow-up. She has tolerated the total contact cast well. She has no issues or complaints today. 5/16; patient presents for follow-up. She states she missed her last week appointment due to having 2 appointments at the same time. She admits to knowing she is not supposed to have the contact cast for over 7 days. She currently denies signs of infection. She has no issues or complaints today. 5/23; patient presents for follow-up. She has no issues or complaints pertaining to the total contact cast. We have been using Hydrofera Blue. She denies signs of infection. 5/30; patient presents for follow-up. She tolerated the total contact cast well over the past week. We have been using Hydrofera Blue under the cast. She has no issues or complaints today. 6/7; total contact cast Hydrofera Blue. Right plantar foot TMA site 6/15; patient presents for follow-up. She has had the total contact cast with Hydrofera Blue for the past week. She has no issues or complaints today. 6/20; patient presents for follow-up. She was without the total contact cast for the past week since the wound appeared closed. She has no issues  or complaints today. 6/27; patient presents for follow-up. She tolerated the total contact cast well. She has no issues or complaints today. The wound appears well-healing. 7/6; patient presents for follow-up. She tolerated the total contact cast well. We have been using Hydrofera Blue under the cast. 7/20; patient presents with reopening of her right plantar foot wound. She states this happened about 1 week ago. She has been keeping the area covered. She denies signs of infection. 7/27; patient presents for follow-up. She tolerated the cast well. The wound is healed. She denies signs of infection. 8/1; patient presents for follow-up. She again tolerated the cast well. The wound was healed at last clinic visit. She has no issues or complaints today. 8/8; patient presents for follow-up. She tolerated the cast well. She has an appointment with podiatry next week for orthotics. She has no issues or complaints today. Electronic Signature(s) Signed: 11/24/2021 11:42:15 AM By: Kalman Shan DO Entered By:  Kalman Shan on 11/24/2021 11:33:54 -------------------------------------------------------------------------------- Physical Exam Details Patient Name: Date of Service: AYLINN, RYDBERG 11/24/2021 11:00 A M Medical Record Number: [id number] Patient Account Number: [id number] Date of Birth/Sex: Treating RN: 1966/02/07 (56 y.o. [name] Primary Care Provider: Jenna Luo Other Clinician: Referring Provider: Treating Provider/Extender: Michel Bickers in Treatment: 58 Constitutional respirations regular, non-labored and within target range for patient.. Cardiovascular 2+ dorsalis pedis/posterior tibialis pulses. Psychiatric pleasant and cooperative. Notes Right foot: T the plantar aspect there is epithelization to the previous wound site. o Electronic Signature(s) Signed: 11/24/2021 11:42:15 AM By: Kalman Shan DO Entered By: Kalman Shan on  11/24/2021 11:34:17 -------------------------------------------------------------------------------- Physician Orders Details Patient Name: Date of Service: [name] Shutter D. 11/24/2021 11:00 A M Medical Record Number: [id number] Patient Account Number: [id number] Date of Birth/Sex: Treating RN: [birth year] (56 y.o. [name] Primary Care Provider: Jenna Luo Other Clinician: Referring Provider: Treating Provider/Extender: Michel Bickers in Treatment: 58 Verbal / Phone Orders: No Diagnosis Coding Discharge From [facility] Discharge from [place] - No further follow up needed, call if anything changes. Follow up with podiatry 11/30/21 for orthotics Non Wound Condition Protect area with: - Foam/Corn Pad Electronic Signature(s) Signed: 11/24/2021 11:42:15 AM By: Kalman Shan DO Entered By: Kalman Shan on 11/24/2021 11:34:38 -------------------------------------------------------------------------------- Problem List Details Patient Name: Date of Service: [name] Shutter D. 11/24/2021 11:00 A M Medical Record Number: [id number] Patient Account Number: [id number] Date of Birth/Sex: Treating RN: November 09, 1965 (56 y.o. [name] Primary Care Provider: Other Clinician: Jenna Luo Referring Provider: Treating Provider/Extender: Michel Bickers in Treatment: 58 Active Problems ICD-10 Encounter Code Description Active Date MDM Diagnosis L97.512 Non-pressure chronic ulcer of other part of right foot with fat layer exposed 07/23/2021 No Yes E11.621 Type 2 diabetes mellitus with foot ulcer 07/23/2021 No Yes G90.09 Other idiopathic peripheral autonomic neuropathy 07/23/2021 No Yes Inactive Problems Resolved Problems Electronic Signature(s) Signed: 11/24/2021 11:42:15 AM By: Kalman Shan DO Entered By: Kalman Shan on 11/24/2021  11:32:46 -------------------------------------------------------------------------------- Progress Note Details Patient Name: Date of Service: [name] Shutter D. 11/24/2021 11:00 A M Medical Record Number: [id number] Patient Account Number: [id number] Date of Birth/Sex: Treating RN: 1965/12/03 (56 y.o. [name] Primary Care Provider: Jenna Luo Other Clinician: Referring Provider: Treating Provider/Extender: Michel Bickers in Treatment: 58 Subjective Chief Complaint Information obtained from Patient 06/03/2019; patient is here for review of a wound on her plantar foot transmetatarsal amputation site at roughly the third met head 07/23/2021; patient presents for right plantar foot wound History of Present Illness (HPI) ADMISSION 06/02/2020 This is a 56 year old woman who has had problems with wounds on her predominantly plantar right foot for quite a period of time. 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 More recently she has had an area developed in the right mid foot at roughly the third metatarsal head. She says this started as a blister of November. Its not been closing. More recently she has been using Silvadene cream she has a cam boot to offload. I note she walks with a cane The patient has had arterial studies done in 2019 at which time her ABI in the right was 1.28 with triphasic waveforms noncompressible on the left with triphasic waveform. Her ABI in our clinic today was 58.3 on the right. 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 She had a foot gastrocnemius recession sometime in the fall 2021 2/21; patient arrives today with a wound measuring slightly smaller 3 x 3 mm however there is circumferential area of erythema  around this. She does not have any complaints 2/28; plantar wound on the right first transmetatarsal amputation site. Small oval-shaped wound raised skin around the edges. We have been using Hydrofera Blue. She does not have arterial issues 06/23/2020 upon evaluation today patient appears to be doing decently well. With regard to her wound. There is little bit of slough and some hyper granulation. I do not feel like the PolyMem did too well for her. In fact she may do better with some Hydrofera Blue. I know we just switch from that but I feel like that may be a better way to continue currently. 3/15; this is a patient with a wound on the plantar right transmetatarsal amputation site probably the second metatarsal head not 1 July as stated previously we have been using polymen switch to [facility] last week. Wound looks about the same to me. We are going to start her on a total contact cast today we have the boot to fit 3/18; first total contact cast change she tolerated this well 3/31; she comes back in with out total contact cast on for 10 days. In spite of this the small wound over the third metatarsal head of her right transmetatarsal amputation site almost looks the same slightly hyper granulated. Circumference is not well adhered. We have been using Hydrofera Blue 4/7 she comes back in in her total contact cast. The wound is smaller epithelializing and looks like it on its way to closing. We have been using Hydrofera Blue over the wound area She has a right transmetatarsal amputation. She tells [place] that she has been to [facility] in the past and they did a modified shoe for her however it did not keep this area epithelialized. She is going to bring that in next week for [place] to look at. It does not sound however that she was really offloading or padding this area at all 4/14; total contact cast. I changed her to calcium alginate today. I think most of the small area is epithelialized  although there is clearly not complete epithelialization and even the area that is epithelialized and the small wound is very tiny She shows me her shoe with the insert. It looks as though the insert probably was too large for her transmet it foot allowing it to move back and forward. I can see where the friction was in the blister she describes forming. I have asked her to take this back to [facility] to see if there is anything they can do 4/21; patient presents for her 1 week follow-up. She has been treated with a total contact cast weekly. She has no complaints today. She denies any fever/chills. 4/26; patient presents for 1 week follow-up in total contact cast change. Patient is having knee pain to her right knee due to chronic osteoarthritis. She is hoping to be able to wear her brace over the cast. 5/10; the wound is closed over however still looks vulnerable. 5/17; the wound remains closed surface looks better. She has a previous right transmet. She is going to convert this into a sneaker. She had a forefoot prosthesis for the foot but not sure that that  are too much good she is supposed to have taken aback from modification however she can do that now. 6/8; patient returns to our clinic for reopening of the plantar right foot transmetatarsal amputation site. She has slight warmth and erythema to the area. She would like to try conservative wound care and not do the total contact cast that was she was doing before at this time 10/01/2020 upon evaluation today patient appears to be doing okay in regard to her foot ulcer. Fortunately there does not appear to be any signs of active infection which is great news and overall very pleased with where things stand today. The wound does appear to be doing well and overall appearance wise is significantly improved compared to last week in my opinion based on my review of her pictures today. Overall the patient tells me that she is not having  any significant pain which is great news as well. Readmission 07/23/2021 Ms. 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 This is a reoccurring wound for the patient. She has been following with Dr. Earleen Newport for this issue. She had a wound culture that grew Staph aureus sensitive to Bactrim and she is currently on this. She has been using Silvadene to the wound bed. She has a cam walker boot and an offloading scooter that she uses. She currently denies signs of infection. 4/13; this is a patient that is a type II diabetic complicated by peripheral neuropathy. She has had a previous transmetatarsal amputation of the right foot. She has a new wound on the plantar right foot between the first and second met heads probably close to the lateral part of the first met head. Generally small clean wound however from 7-9 o'clock there is a probing area close to bone. She finished a course of trimethoprim/sulfamethoxazole yesterday for methicillin- resistant Staph aureus in the wound. I believe this was ordered by podiatry she has been using a Dakin's wet-to-dry dressing. She uses a cam boot and a knee scooter. She says she is religious with this 4/20; patient presents for follow-up. She continues to use Dakin's wet-to-dry dressings without issues. She states she would like to try the total contact cast that she has had this in the past. She currently denies signs of infection. 4/24; patient presents for follow-up. She would like the total contact cast placed today. She has been using Dakin's wet-to-dry dressings without issues. She denies signs of infection. 08-12-2021 patient presents today for obligatory first cast change. This was placed on Monday today is Wednesday. She seems to be doing quite well with using Hydrofera  Blue and the overall appearance of the wound bed is excellent. We do need to replace the cast the good news is there is nothing that seems to be rubbing at this point which is awesome. 5/1; patient presents for follow-up. She has tolerated the total contact cast well. She has no issues or complaints today. 5/16; patient presents for follow-up. She states she missed her last week appointment due to having 2 appointments at the same time. She admits to knowing she is not supposed to have the contact cast for over 7 days. She currently denies signs of infection. She has no issues or complaints today. 5/23; patient presents for follow-up. She has no issues or complaints pertaining to the  total contact cast. We have been using Hydrofera Blue. She denies signs of infection. 5/30; patient presents for follow-up. She tolerated the total contact cast well over the past week. We have been using Hydrofera Blue under the cast. She has no issues or complaints today. 6/7; total contact cast Hydrofera Blue. Right plantar foot TMA site 6/15; patient presents for follow-up. She has had the total contact cast with Hydrofera Blue for the past week. She has no issues or complaints today. 6/20; patient presents for follow-up. She was without the total contact cast for the past week since the wound appeared closed. She has no issues or complaints today. 6/27; patient presents for follow-up. She tolerated the total contact cast well. She has no issues or complaints today. The wound appears well-healing. 7/6; patient presents for follow-up. She tolerated the total contact cast well. We have been using Hydrofera Blue under the cast. 7/20; patient presents with reopening of her right plantar foot wound. She states this happened about 1 week ago. She has been keeping the area covered. She denies signs of infection. 7/27; patient presents for follow-up. She tolerated the cast well. The wound is healed. She denies signs of  infection. 8/1; patient presents for follow-up. She again tolerated the cast well. The wound was healed at last clinic visit. She has no issues or complaints today. 8/8; patient presents for follow-up. She tolerated the cast well. She has an appointment with podiatry next week for orthotics. She has no issues or complaints today. Patient History Information obtained from Patient. Family History Cancer - Paternal Grandparents, Diabetes - Mother,Maternal Grandparents, Heart Disease - Mother,Paternal Grandparents,Maternal Grandparents,Father,Siblings, Hypertension - Mother,Father, Lung Disease - Mother, No family history of Hereditary Spherocytosis, Kidney Disease, Seizures, Stroke, Thyroid Problems, Tuberculosis. Social History Never smoker, Marital Status - Widowed, Alcohol Use - Never, Drug Use - Prior History, Caffeine Use - Daily - soda's. Medical History Eyes Denies history of Cataracts, Glaucoma, Optic Neuritis Ear/Nose/Mouth/Throat Denies history of Chronic sinus problems/congestion, Middle ear problems Hematologic/Lymphatic Denies history of Anemia, Hemophilia, Human Immunodeficiency Virus, Lymphedema, Sickle Cell Disease Respiratory Patient has history of Asthma, Sleep Apnea Denies history of Aspiration, Chronic Obstructive Pulmonary Disease (COPD), Pneumothorax, Tuberculosis Cardiovascular Patient has history of Hypertension Denies history of Angina, Arrhythmia, Congestive Heart Failure, Coronary Artery Disease, Deep Vein Thrombosis, Hypotension, Myocardial Infarction, Peripheral Arterial Disease, Peripheral Venous Disease, Phlebitis, Vasculitis Gastrointestinal Denies history of Cirrhosis , Colitis, Crohnoos, Hepatitis A, Hepatitis B, Hepatitis C Endocrine Denies history of Type I Diabetes, Type II Diabetes Genitourinary Denies history of End Stage Renal Disease Immunological Denies history of Lupus Erythematosus, Raynaudoos, Scleroderma Integumentary (Skin) Denies  history of History of Burn Musculoskeletal Patient has history of Osteoarthritis Denies history of Gout, Rheumatoid Arthritis, Osteomyelitis Neurologic Patient has history of Neuropathy, 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 - left 4th toe amp. - neck surgery. - spine surgery. - abdom. hysterectomy. - appendectomy. - lap. knee surgery. Medical A Surgical History Notes nd Constitutional Symptoms (General Health) torn right shoulder rotator cuff. Gastrointestinal Hx of GERD Psychiatric hx of bipolar 1 disorder, anxiety, depression. Objective Constitutional respirations regular, non-labored and within target range for patient.. Vitals Time Taken: 11:16 AM, Height: 64 in, Weight: 228 lbs, BMI: 39.1, Pulse: 76 bpm, Respiratory Rate: 18 breaths/min, Blood Pressure: 134/85 mmHg. Cardiovascular 2+ dorsalis pedis/posterior tibialis pulses. Psychiatric  pleasant and cooperative. General Notes: Right foot: T the plantar aspect there is epithelization to the previous wound site. o Integumentary (Hair, Skin) Wound #3 status is Healed - Epithelialized. Original cause of wound was Gradually Appeared. The date acquired was: 11/05/2021. The wound has been in treatment 2 weeks. The wound is located on the [place]. The wound measures 0cm length x 0cm width x 0cm depth; 0cm^2 area and 0cm^3 volume. Assessment Active Problems ICD-10 Non-pressure chronic ulcer of other part of right foot with fat layer exposed Type 2 diabetes mellitus with foot ulcer Other idiopathic peripheral autonomic neuropathy Patient's wound remains closed. She has had cast placement for 2 additional weeks since her wound has healed. At this time I recommended an  offloading donut pad by the time she obtains orthotics. She is scheduled on 8/14 to see podiatry for this. Follow-up as needed. Plan Discharge From [facility] Services: Discharge from [place] - No further follow up needed, call if anything changes. Follow up with podiatry 11/30/21 for orthotics Non Wound Condition: Protect area with: - Foam/Corn Pad 1. Offloadingoofoam donut pad 2. Follow-up as needed 3. Discharge from clinic due to closed wound Electronic Signature(s) Signed: 11/24/2021 11:42:15 AM By: Kalman Shan DO Entered By: Kalman Shan on 11/24/2021 11:36:18 -------------------------------------------------------------------------------- HxROS Details Patient Name: Date of Service: [name] Shutter D. 11/24/2021 11:00 A M Medical Record Number: [id number] Patient Account Number: [id number] Date of Birth/Sex: Treating RN: Sep 05, 1965 (56 y.o. [name] Primary Care Provider: Jenna Luo Other Clinician: Referring Provider: Treating Provider/Extender: Michel Bickers in Treatment: 58 Information Obtained From Patient Constitutional Symptoms (General Health) Medical History: Past Medical History Notes: torn right shoulder rotator cuff. Eyes Medical History: Negative for: Cataracts; Glaucoma; Optic Neuritis Ear/Nose/Mouth/Throat Medical History: Negative for: Chronic sinus problems/congestion; Middle ear problems Hematologic/Lymphatic Medical History: Negative for: Anemia; Hemophilia; Human Immunodeficiency Virus; Lymphedema; Sickle Cell Disease Respiratory Medical History: Positive for: Asthma; Sleep Apnea Negative for: Aspiration; Chronic Obstructive Pulmonary Disease (COPD); Pneumothorax; Tuberculosis Cardiovascular Medical History: Positive for: Hypertension Negative for: Angina; Arrhythmia; Congestive Heart Failure; Coronary Artery Disease; Deep Vein Thrombosis; Hypotension; Myocardial Infarction; Peripheral Arterial  Disease; Peripheral Venous Disease; Phlebitis; Vasculitis Gastrointestinal Medical History: Negative for: Cirrhosis ; Colitis; Crohns; Hepatitis A; Hepatitis B; Hepatitis C Past Medical History Notes: Hx of GERD Endocrine Medical History: Negative for: Type I Diabetes; Type II Diabetes Genitourinary Medical History: Negative for: End Stage Renal Disease Immunological Medical History: Negative for: Lupus Erythematosus; Raynauds; Scleroderma Integumentary (Skin) Medical History: Negative for: History of Burn Musculoskeletal Medical History: Positive for: Osteoarthritis Negative for: Gout; Rheumatoid Arthritis; Osteomyelitis Neurologic Medical History: Positive for: Neuropathy; Seizure Disorder - hx of seizures r/t tramadol Negative for: Dementia; Quadriplegia; Paraplegia Oncologic Medical History: Negative for: Received Chemotherapy; Received Radiation Psychiatric Medical History: Negative for: Anorexia/bulimia; Confinement Anxiety Past Medical History Notes: hx of bipolar 1 disorder, anxiety, depression. Immunizations Pneumococcal Vaccine: Received Pneumococcal Vaccination: Yes Received Pneumococcal Vaccination On or After 60th Birthday: No Immunization Notes: pt. doesn't remember last tetanus shot Implantable Devices None Hospitalization / Surgery History Type of Hospitalization/Surgery trasmet amp right foot left 4th toe amp neck surgery spine surgery abdom. hysterectomy appendectomy lap. knee surgery Family and Social History Cancer: Yes - Paternal Grandparents; Diabetes: Yes - Mother,Maternal Grandparents; Heart Disease: Yes - Mother,Paternal Grandparents,Maternal Grandparents,Father,Siblings; Hereditary Spherocytosis: No; Hypertension: Yes - Mother,Father; Kidney Disease: No; Lung Disease: Yes - Mother; Seizures: No; Stroke: No; Thyroid Problems: No; Tuberculosis: No; Never smoker; Marital Status - Widowed; Alcohol Use: Never; Drug  Use: Prior History;  Caffeine Use: Daily - soda's; Financial Concerns: No; Food, Clothing or Shelter Needs: No; Support System Lacking: No; Transportation Concerns: No Electronic Signature(s) Signed: 11/24/2021 11:42:15 AM By: Kalman Shan DO Signed: 11/24/2021 4:55:14 PM By: Lorrin Jackson Entered By: Kalman Shan on 11/24/2021 11:33:59 -------------------------------------------------------------------------------- SuperBill Details Patient Name: Date of Service: [name] 11/24/2021 Medical Record Number: [id number] Patient Account Number: [id number] Date of Birth/Sex: Treating RN: [birth year] (56 y.o. [name] Primary Care Provider: Jenna Luo Other Clinician: Referring Provider: Treating Provider/Extender: Michel Bickers in Treatment: 58 Diagnosis Coding ICD-10 Codes Code Description [phone] Non-pressure chronic ulcer of other part of right foot with fat layer exposed E11.621 Type 2 diabetes mellitus with foot ulcer G90.09 Other idiopathic peripheral autonomic neuropathy Facility Procedures CPT4 Code: [id number] Description: [phone] - WOUND CARE VISIT-LEV 2 EST PT Modifier: Quantity: 1 Physician Procedures : CPT4 Code Description Modifier [id number] [id number] - WC PHYS LEVEL 3 - EST PT ICD-10 Diagnosis Description L97.512 Non-pressure chronic ulcer of other part of right foot with fat layer exposed E11.621 Type 2 diabetes mellitus with foot ulcer G90.09 Other  idiopathic peripheral autonomic neuropathy Quantity: 1 Electronic Signature(s) Signed: 11/24/2021 11:42:15 AM By: Kalman Shan DO Entered By: Kalman Shan on 11/24/2021 11:36:49

## 2021-11-24 NOTE — Progress Notes (Signed)
[name] [name] ([id number]) Visit Report for 11/24/2021 Arrival Information Details Patient Name: Date of Service: [name] [name], [name] [name] 11/24/2021 11:00 A M Medical Record Number: [id number] Patient Account Number: [id number] Date of Birth/Sex: Treating RN: 26-Mar-1966 (56 y.o. [name] [name] Primary Care Aarav Burgett: Jenna Luo Other Clinician: Referring Maisa Bedingfield: Treating Sheyli Horwitz/Extender: Michel Bickers in Treatment: 10 Visit Information History Since Last Visit Added or deleted any medications: No Patient Arrived: [name] [name] Any new allergies or adverse reactions: No Arrival Time: 11:13 Had a fall or experienced change in No Accompanied By: self activities of daily living that may affect Transfer Assistance: None risk of falls: Patient Identification Verified: Yes Signs or symptoms of abuse/neglect since last No Secondary Verification Process Completed: Yes visito Patient Requires Transmission-Based Precautions: No Hospitalized since last visit: No Patient Has Alerts: No Implantable device outside of the clinic No excluding cellular tissue based products placed in the center since last visit: Has Dressing in Place as Prescribed: Yes Has Footwear/Offloading in Place as Yes Prescribed: Right: Removable Cast Walker/Walking Boot Pain Present Now: Yes Electronic Signature(s) Signed: 11/24/2021 12:26:25 PM By: Erenest Blank Entered By: Erenest Blank on 11/24/2021 11:14:21 -------------------------------------------------------------------------------- Clinic Level of Care Assessment Details Patient Name: Date of Service: [name] [name] 11/24/2021 11:00 A M Medical Record Number: [id number] Patient Account Number: [id number] Date of Birth/Sex: Treating RN: 06-23-1965 (56 y.o. [name] [name] Primary Care Hatsuko Bizzarro: Jenna Luo Other Clinician: Referring Kemyah Buser: Treating Hawken Bielby/Extender: Michel Bickers in Treatment:  17 Clinic Level of Care Assessment Items TOOL 4 Quantity Score X- 1 0 Use when only an EandM is performed on FOLLOW-UP visit ASSESSMENTS - Nursing Assessment / Reassessment X- 1 10 Reassessment of Co-morbidities (includes updates in patient status) X- 1 5 Reassessment of Adherence to Treatment Plan ASSESSMENTS - Wound and Skin A ssessment / Reassessment X - Simple Wound Assessment / Reassessment - one wound 1 5 '[]'$  - 0 Complex Wound Assessment / Reassessment - multiple wounds '[]'$  - 0 Dermatologic / Skin Assessment (not related to wound area) ASSESSMENTS - Focused Assessment '[]'$  - 0 Circumferential Edema Measurements - multi extremities '[]'$  - 0 Nutritional Assessment / Counseling / Intervention '[]'$  - 0 Lower Extremity Assessment (monofilament, tuning fork, pulses) '[]'$  - 0 Peripheral Arterial Disease Assessment (using hand held doppler) ASSESSMENTS - Ostomy and/or Continence Assessment and Care '[]'$  - 0 Incontinence Assessment and Management '[]'$  - 0 Ostomy Care Assessment and Management (repouching, etc.) PROCESS - Coordination of Care X - Simple Patient / Family Education for ongoing care 1 15 '[]'$  - 0 Complex (extensive) Patient / Family Education for ongoing care '[]'$  - 0 Staff obtains Programmer, systems, Records, T Results / Process Orders est '[]'$  - 0 Staff telephones HHA, Nursing Homes / Clarify orders / etc '[]'$  - 0 Routine Transfer to another Facility (non-emergent condition) '[]'$  - 0 Routine Hospital Admission (non-emergent condition) '[]'$  - 0 New Admissions / Biomedical engineer / Ordering NPWT Apligraf, etc. , '[]'$  - 0 Emergency Hospital Admission (emergent condition) X- 1 10 Simple Discharge Coordination '[]'$  - 0 Complex (extensive) Discharge Coordination PROCESS - Special Needs '[]'$  - 0 Pediatric / Minor Patient Management '[]'$  - 0 Isolation Patient Management '[]'$  - 0 Hearing / Language / Visual special needs '[]'$  - 0 Assessment of Community assistance (transportation, D/C planning,  etc.) '[]'$  - 0 Additional assistance / Altered mentation '[]'$  - 0 Support Surface(s) Assessment (bed, cushion, seat, etc.) INTERVENTIONS - Wound Cleansing / Measurement '[]'$  - 0 Simple Wound Cleansing - one wound '[]'$  -  0 Complex Wound Cleansing - multiple wounds X- 1 5 Wound Imaging (photographs - any number of wounds) '[]'$  - 0 Wound Tracing (instead of photographs) '[]'$  - 0 Simple Wound Measurement - one wound '[]'$  - 0 Complex Wound Measurement - multiple wounds INTERVENTIONS - Wound Dressings '[]'$  - 0 Small Wound Dressing one or multiple wounds '[]'$  - 0 Medium Wound Dressing one or multiple wounds '[]'$  - 0 Large Wound Dressing one or multiple wounds '[]'$  - 0 Application of Medications - topical '[]'$  - 0 Application of Medications - injection INTERVENTIONS - Miscellaneous '[]'$  - 0 External ear exam '[]'$  - 0 Specimen Collection (cultures, biopsies, blood, body fluids, etc.) '[]'$  - 0 Specimen(s) / Culture(s) sent or taken to Lab for analysis '[]'$  - 0 Patient Transfer (multiple staff / Civil Service fast streamer / Similar devices) '[]'$  - 0 Simple Staple / Suture removal (25 or less) '[]'$  - 0 Complex Staple / Suture removal (26 or more) '[]'$  - 0 Hypo / Hyperglycemic Management (close monitor of Blood Glucose) '[]'$  - 0 Ankle / Brachial Index (ABI) - do not check if billed separately X- 1 5 Vital Signs Has the patient been seen at the hospital within the last three years: Yes Total Score: 55 Level Of Care: New/Established - Level 2 Electronic Signature(s) Signed: 11/24/2021 4:55:14 PM By: Lorrin Jackson Entered By: Lorrin Jackson on 11/24/2021 11:35:07 -------------------------------------------------------------------------------- Encounter Discharge Information Details Patient Name: Date of Service: [name] Shutter D. 11/24/2021 11:00 A M Medical Record Number: [id number] Patient Account Number: [id number] Date of Birth/Sex: Treating RN: 1965/05/07 (56 y.o. [name] [name] Primary Care Seneca Hoback: Jenna Luo Other  Clinician: Referring Shaena Parkerson: Treating Emett Stapel/Extender: Michel Bickers in Treatment: 17 Encounter Discharge Information Items Discharge Condition: Stable Ambulatory Status: Ambulatory Discharge Destination: Home Transportation: Private Auto Schedule Follow-up Appointment: No Clinical Summary of Care: Provided on 11/24/2021 Form Type Recipient Paper Patient Patient Electronic Signature(s) Signed: 11/24/2021 12:39:34 PM By: Lorrin Jackson Entered By: Lorrin Jackson on 11/24/2021 12:39:34 -------------------------------------------------------------------------------- Lower Extremity Assessment Details Patient Name: Date of Service: [name] Shutter D. 11/24/2021 11:00 A M Medical Record Number: [id number] Patient Account Number: [id number] Date of Birth/Sex: Treating RN: 08/14/1965 (56 y.o. [name] [name] Primary Care Libbi Towner: Jenna Luo Other Clinician: Referring Areta Terwilliger: Treating Sheyna Pettibone/Extender: Michel Bickers in Treatment: 17 Edema Assessment Assessed: Shirlyn Goltz: No] Patrice Paradise: Yes] Edema: [Left: N] [Right: o] Calf Left: Right: Point of Measurement: 26 cm From Medial Instep 38.5 cm Ankle Left: Right: Point of Measurement: 10 cm From Medial Instep 24 cm Electronic Signature(s) Signed: 11/24/2021 4:55:14 PM By: Lorrin Jackson Entered By: Lorrin Jackson on 11/24/2021 11:26:41 -------------------------------------------------------------------------------- Multi Wound Chart Details Patient Name: Date of Service: [name] Shutter D. 11/24/2021 11:00 A M Medical Record Number: [id number] Patient Account Number: [id number] Date of Birth/Sex: Treating RN: 09-03-1965 (56 y.o. [name] [name] Primary Care Emilyanne Mcgough: Jenna Luo Other Clinician: Referring Takara Sermons: Treating Merick Kelleher/Extender: Michel Bickers in Treatment: 17 Vital Signs Height(in): 67 Pulse(bpm): 48 Weight(lbs): 228 Blood  Pressure(mmHg): 134/85 Body Mass Index(BMI): 39.1 Temperature(F): Respiratory Rate(breaths/min): 18 Photos: [N/A:N/A] Right, Plantar Foot N/A N/A Wound Location: Gradually Appeared N/A N/A Wounding Event: Neuropathic Ulcer-Non Diabetic N/A N/A Primary Etiology: Asthma, Sleep Apnea, Hypertension, N/A N/A Comorbid History: Osteoarthritis, Neuropathy, Seizure Disorder 11/05/2021 N/A N/A Date Acquired: 2 N/A N/A Weeks of Treatment: Open N/A N/A Wound Status: No N/A N/A Wound Recurrence: 0x0x0 N/A N/A Measurements L x W x D (cm) 0 N/A N/A A (cm) : rea 0 N/A N/A Volume (cm) :  100.00% N/A N/A % Reduction in Area: 100.00% N/A N/A % Reduction in Volume: Full Thickness Without Exposed N/A N/A Classification: Support Structures Treatment Notes Electronic Signature(s) Signed: 11/24/2021 11:42:15 AM By: Kalman Shan DO Signed: 11/24/2021 4:55:14 PM By: Lorrin Jackson Entered By: Kalman Shan on 11/24/2021 11:32:52 -------------------------------------------------------------------------------- [place] Details Patient Name: Date of Service: [name] Shutter D. 11/24/2021 11:00 A M Medical Record Number: [id number] Patient Account Number: [id number] Date of Birth/Sex: Treating RN: 1966-03-11 (56 y.o. [name] [name] Primary Care Zaeda Mcferran: Jenna Luo Other Clinician: Referring Fischer Halley: Treating Janeane Cozart/Extender: Michel Bickers in Treatment: 17 Active Inactive Electronic Signature(s) Signed: 11/24/2021 4:55:14 PM By: Lorrin Jackson Entered By: Lorrin Jackson on 11/24/2021 11:34:16 -------------------------------------------------------------------------------- Pain Assessment Details Patient Name: Date of Service: [name] Shutter D. 11/24/2021 11:00 A M Medical Record Number: [id number] Patient Account Number: [id number] Date of Birth/Sex: Treating RN: 02-10-1966 (56 y.o. [name] [name] Primary Care Raliyah Montella:  Jenna Luo Other Clinician: Referring Jacorion Klem: Treating Armina Galloway/Extender: Michel Bickers in Treatment: 17 Active Problems Location of Pain Severity and Description of Pain Patient Has Paino Yes Site Locations Pain Location: Generalized Pain Rate the pain. Current Pain Level: 7 Pain Management and Medication Current Pain Management: Electronic Signature(s) Signed: 11/24/2021 12:26:25 PM By: Erenest Blank Signed: 11/24/2021 4:55:14 PM By: Lorrin Jackson Entered By: Erenest Blank on 11/24/2021 11:14:36 -------------------------------------------------------------------------------- Patient/Caregiver Education Details Patient Name: Date of Service: Fountain, Noelly D. 8/8/2023andnbsp11:00 A M Medical Record Number: [id number] Patient Account Number: [id number] Date of Birth/Gender: Treating RN: 1965-12-14 (56 y.o. [name] [name] Primary Care Physician: Jenna Luo Other Clinician: Referring Physician: Treating Physician/Extender: Michel Bickers in Treatment: 17 Education Assessment Education Provided To: Patient Education Topics Provided Offloading: Methods: Explain/Verbal, Printed Responses: State content correctly [employer]) Signed: 11/24/2021 4:55:14 PM By: Lorrin Jackson Entered By: Lorrin Jackson on 11/24/2021 11:34:32 -------------------------------------------------------------------------------- Wound Assessment Details Patient Name: Date of Service: [name] Shutter D. 11/24/2021 11:00 A M Medical Record Number: [id number] Patient Account Number: [id number] Date of Birth/Sex: Treating RN: 11-24-1965 (56 y.o. [name] [name] Primary Care Felicite Zeimet: Jenna Luo Other Clinician: Referring Jamilynn Whitacre: Treating Poetry Cerro/Extender: Michel Bickers in Treatment: 17 Wound Status Wound Number: 3 Primary Neuropathic Ulcer-Non Diabetic Etiology: Wound Location: Right,  Plantar Foot Wound Healed - Epithelialized Wounding Event: Gradually Appeared Status: Date Acquired: 11/05/2021 Comorbid Asthma, Sleep Apnea, Hypertension, Osteoarthritis, Weeks Of Treatment: 2 History: Neuropathy, Seizure Disorder Clustered Wound: No Photos Wound Measurements Length: (cm) 0 Width: (cm) 0 Depth: (cm) Area: (cm) Volume: (cm) % Reduction in Area: 100% % Reduction in Volume: 100% 0 0 0 Wound Description Classification: Full Thickness Without Exposed Support Structur es Electronic Signature(s) Signed: 11/24/2021 4:55:14 PM By: Lorrin Jackson Entered By: Lorrin Jackson on 11/24/2021 11:33:51 -------------------------------------------------------------------------------- [place] Details Patient Name: Date of Service: [name] Shutter D. 11/24/2021 11:00 A M Medical Record Number: [id number] Patient Account Number: [id number] Date of Birth/Sex: Treating RN: Apr 02, 1966 (56 y.o. [name] [name] Primary Care Kevontay Burks: Jenna Luo Other Clinician: Referring Elvi Leventhal: Treating Angel Hobdy/Extender: Michel Bickers in Treatment: 17 Vital Signs Time Taken: 11:16 Pulse (bpm): 76 Height (in): 64 Respiratory Rate (breaths/min): 18 Weight (lbs): 228 Blood Pressure (mmHg): 134/85 Body Mass Index (BMI): 39.1 Reference Range: 80 - 120 mg / dl Electronic Signature(s) Signed: 11/24/2021 12:26:25 PM By: Erenest Blank Entered By: Erenest Blank on 11/24/2021 11:16:22

## 2021-11-25 DIAGNOSIS — M1612 Unilateral primary osteoarthritis, left hip: Secondary | ICD-10-CM | POA: Diagnosis not present

## 2021-11-26 NOTE — Progress Notes (Signed)
Chronic Care Management Pharmacy Note  12/10/2021 Name:  [name] [name] MRN:  [id number] DOB:  1966-03-20  Summary: PharmD FU visit, she loves her medications being delivered and has everything synchronized.  Relatively stable mood, she does mention some anger outbursts lately that she cannot control.  No concerns at this time.  Recommendations/Changes made from today's visit: No changes  Plan: FU 6 months   Subjective: [name] [name] is an 56 y.o. year old female who is a primary patient of Pickard, Cammie Mcgee, MD.  The CCM team was consulted for assistance with disease management and care coordination needs.    Engaged with patient by telephone for initial visit in response to provider referral for pharmacy case management and/or care coordination services.   Consent to Services:  The patient was given the following information about Chronic Care Management services today, agreed to services, and gave verbal consent: 1. CCM service includes personalized support from designated clinical staff supervised by the primary care provider, including individualized plan of care and coordination with other care providers 2. 24/7 contact phone numbers for assistance for urgent and routine care needs. 3. Service will only be billed when office clinical staff spend 20 minutes or more in a month to coordinate care. 4. Only one practitioner may furnish and bill the service in a calendar month. 5.The patient may stop CCM services at any time (effective at the end of the month) by phone call to the office staff. 6. The patient will be responsible for cost sharing (co-pay) of up to 20% of the service fee (after annual deductible is met). Patient agreed to services and consent obtained.  Patient Care Team: [name], MD as PCP - General (Family Medicine) [name], MD as PCP - Cardiology (Cardiology) Edythe Clarity, [facility] as Pharmacist (Pharmacist)  Recent office visits:  05/12/2021  OV (PCP) [name], MD;  I recommended adding famotidine 40 mg p.o. nightly to the pantoprazole that she is taking.  If this does not help we can try stopping the meloxicam.    Recent consult visits:  05/14/2021 (Podiatry) [name], DPM; no medication changes indicated.   04/27/2021 (Podiatry) [name], DPM; 05/14/2021 (Podiatry) [name], DPM;    04/09/2021 (Podiatry) [name], DPM; no medication changes indicated.   04/02/2021 (Podiatry) [name], DPM;  Due to the mild erythema I think is more from the sutures from soap and restart cephalexin.   03/24/2021 (Podiatry) [name], DPM; no medication changes indicated.   03/20/2021 (Podiatry) [name], DPM; no medication changes indicated.   03/06/2021 (Podiatry) [name], DPM; no medication changes indicated.   02/23/2021 (Podiatry) [name], DPM; no medication changes indicated.   01/28/2021 (Cardiology) [name], MD; no medication changes indicated.   01/22/2021 (Podiatry) [name], DPM; no medication changes indicated.   01/08/2021 (Podiatry) [name], DPM; no medication changes indicated.   12/30/2020 (Podiatry) [name], DPM; no medication changes indicated.   12/23/2020 (Podiatry) [name], DPM; no medication changes indicated.   12/18/2020 (Podiatry) [name], DPM; no medication changes indicated.   12/11/2020 (Podiatry) [name], DPM; no medication changes indicated.   12/02/2020 (Podiatry) [name], DPM; no medication changes indicated.   11/27/2020 (Podiatry) [name], DPM; no medication changes indicated.   11/24/2020 (Podiatry) [name], DPM; no medication changes indicated.   Hospital visits:  04/17/2021 ED  visit for Internal derangement of right shoulder -No medication changes indicated   02/03/2021 Admission for  elective surgery - Revision of Lumbar Fusion w/hardware removal   01/06/2021 ED visit for Fall -No medication changes indicated.     Objective:  Lab Results  Component Value Date   CREATININE 0.78 09/18/2021   BUN 16 09/18/2021   EGFR 89 09/18/2021   GFRNONAA >60 07/01/2021   GFRAA 119 06/27/2020   NA 138 09/18/2021   K 4.7 09/18/2021   CALCIUM 9.6 09/18/2021   CO2 27 09/18/2021   GLUCOSE 97 09/18/2021    Lab Results  Component Value Date/Time   HGBA1C 5.6 08/26/2019 06:56 AM   HGBA1C 5.2 05/15/2019 04:27 PM    Last diabetic Eye exam: No results found for: "HMDIABEYEEXA"  Last diabetic Foot exam: No results found for: "HMDIABFOOTEX"   Lab Results  Component Value Date   CHOL 187 09/18/2021   HDL 67 09/18/2021   LDLCALC 84 09/18/2021   TRIG 271 (H) 09/18/2021   CHOLHDL 2.8 09/18/2021       Latest Ref Rng & Units 09/18/2021   10:51 AM 07/17/2021    2:27 PM 06/30/2021    1:53 PM  Hepatic Function  Total Protein 6.1 - 8.1 g/dL 6.8  6.6  6.3   Albumin 3.5 - 5.0 g/dL   2.7   AST 10 - 35 U/L _0 ALT 6 - 29 U/L _1 Alk Phosphatase 38 - 126 U/L   64   Total Bilirubin 0.2 - 1.2 mg/dL 0.3  0.3  0.6          Latest Ref Rng & Units 09/18/2021   10:51 AM 07/17/2021    2:27 PM 07/01/2021    6:06 AM  CBC  WBC 3.8 - 10.8 Thousand/uL 5.5  5.7  7.1   Hemoglobin 11.7 - 15.5 g/dL 13.0  12.0  10.4   Hematocrit 35.0 - 45.0 % 39.2  36.0  32.0   Platelets 140 - 400 Thousand/uL 273  359  239     No results found for: "VD25OH"  Clinical ASCVD: No  The 10-year ASCVD risk score (Arnett DK, et al., 2019) is: 6.3%   Values used to calculate the score:     Age: 53 years     Sex: Female     Is Non-Hispanic African American: No     Diabetic: Yes     Tobacco smoker: No     Systolic Blood Pressure: 732 mmHg     Is BP treated: Yes     HDL Cholesterol: 67 mg/dL     Total Cholesterol: 187 mg/dL       09/18/2021   10:12 AM 08/21/2020    2:15 PM 02/08/2020   11:38  AM  Depression screen PHQ 2/9  Decreased Interest 0 0 0  Down, Depressed, Hopeless 0 0 0  PHQ - 2 Score 0 0 0      Social History   Tobacco Use  Smoking Status Never  Smokeless Tobacco Never   BP Readings from Last 3 Encounters:  09/18/21 136/80  07/17/21 122/76  07/02/21 125/74   Pulse Readings from Last 3 Encounters:  09/18/21 82  07/17/21 74  07/02/21 71   Wt Readings from Last 3 Encounters:  09/18/21 240 lb (108.9 kg)  07/17/21 234 lb 3.2 oz (106.2 kg)  06/30/21 220 lb 0.3 oz (99.8 kg)   BMI Readings from Last 3  Encounters:  09/18/21 41.20 kg/m  07/17/21 40.20 kg/m  06/30/21 37.77 kg/m    Assessment/Interventions: Review of patient past medical history, allergies, medications, health status, including review of consultants reports, laboratory and other test data, was performed as part of comprehensive evaluation and provision of chronic care management services.   SDOH:  (Social Determinants of Health) assessments and interventions performed: Yes  Financial Resource Strain: Low Risk  (08/21/2020)   Overall Financial Resource Strain (CARDIA)    Difficulty of Paying Living Expenses: Not hard at all   Food Insecurity: No Food Insecurity (08/21/2020)   Hunger Vital Sign    Worried About Running Out of Food in the Last Year: Never true    [place] in the Last Year: Never true    SDOH Screenings   Alcohol Screen: Low Risk  (08/21/2020)   Alcohol Screen    Last Alcohol Screening Score (AUDIT): 0  Depression (PHQ2-9): Low Risk  (09/18/2021)   Depression (PHQ2-9)    PHQ-2 Score: 0  Financial Resource Strain: Low Risk  (08/21/2020)   Overall Financial Resource Strain (CARDIA)    Difficulty of Paying Living Expenses: Not hard at all  Food Insecurity: No Food Insecurity (08/21/2020)   Hunger Vital Sign    Worried About Running Out of Food in the Last Year: Never true    Ran Out of Food in the Last Year: Never true  Housing: Low Risk  (08/21/2020)   Housing     Last Housing Risk Score: 0  Physical Activity: Inactive (08/21/2020)   Exercise Vital Sign    Days of Exercise per Week: 0 days    Minutes of Exercise per Session: 0 min  Social Connections: Socially Isolated (08/21/2020)   Social Connection and Isolation Panel [NHANES]    Frequency of Communication with Friends and Family: More than three times a week    Frequency of Social Gatherings with Friends and Family: Twice a week    Attends Religious Services: Never    Marine scientist or Organizations: No    Attends Archivist Meetings: Never    Marital Status: Widowed  Stress: No Stress Concern Present (08/21/2020)   [place]    Feeling of Stress : Not at all  Tobacco Use: Low Risk  (06/30/2021)   Patient History    Smoking Tobacco Use: Never    Smokeless Tobacco Use: Never    Passive Exposure: Not on file  Transportation Needs: No Transportation Needs (08/21/2020)   PRAPARE - Transportation    Lack of Transportation (Medical): No    Lack of Transportation (Non-Medical): No    CCM Care Plan  Allergies  Allergen Reactions   Other    Penicillins Hives and Rash    No anaphylactic or severe cutaneous Sx > 10 yr ago, no additional medical attention required Tolerates Keflex and Rocephin   Tetracyclines & Related Other (See Comments)    Syncope and put her "in a coma"   Tramadol Other (See Comments)    Seizures   Phenazopyridine Other (See Comments)    Unknown   Tetracycline Other (See Comments)    Syncope and "Put me in a coma"   Ciprofloxacin Rash and Itching   Codeine Itching and Rash   Estradiol Rash    Patches broke out the skin    Medications Reviewed Today     Reviewed by Edythe Clarity, [facility] (Pharmacist) on 12/10/21 at 1506  Med List  Status: <None>   Medication Order Taking? Sig Documenting Provider Last Dose Status Informant  AIMOVIG 70 MG/ML SOAJ [id number] Yes ADMINISTER 1 ML UNDER THE  SKIN EVERY 30 DAYS [name], MD Taking Active   albuterol [facility] HFA) 108 [phone] Base) MCG/ACT inhaler [id number] Yes Inhale 2 puffs = 142mg into the lungs every 6 (six) hours as needed for wheezing or shortness of breath. MEulogio Bear NP Taking Active Self  busPIRone (BUSPAR) 15 MG tablet [id number] Take 15 mg by mouth 3 (three) times daily.  [provider] Taking Active Self  Calcium Carbonate-Vit D-Min (CALCIUM 1200 PO) [id number] Take 1,200 mg by mouth daily.  [provider] Taking Active Self  cyclobenzaprine (FLEXERIL) 10 MG tablet [id number] Take 10 mg by mouth 2 (two) times daily as needed for muscle spasms.  [provider] Taking Active Self  DULoxetine (CYMBALTA) 60 MG capsule [id number] TAKE 1 CAPSULE BY MOUTH EVERY DAY PSusy Frizzle MD Taking Active Self  escitalopram (LEXAPRO) 10 MG tablet [id number] Take 10 mg by mouth daily. [provider] Taking Active Self  estradiol (ESTRACE) 1 MG tablet [id number] Take 1 tablet (1 mg total) by mouth at bedtime. PSusy Frizzle MD Taking Active   famotidine (PEPCID) 40 MG tablet [id number] Take 1 tablet (40 mg total) by mouth at bedtime. PSusy Frizzle MD Taking Active Self  fluticasone (Asencion Islam 50 MCG/ACT nasal spray [id number] SPRAY 2 SPRAYS INTRO EACH NOSTRIL EVERY DAY  Patient taking differently: Place 2 sprays into both nostrils daily as needed for allergies.   PSusy Frizzle MD Taking Active Self  haloperidol (HALDOL) 5 MG tablet [id number] Take 1 tablet (5 mg total) by mouth at bedtime. For psychosis MBarton Dubois MD Taking Active Self  lamoTRIgine (LAMICTAL) 25 MG tablet [id number] Take by mouth. [provider] Taking Active   levocetirizine (XYZAL) 5 MG tablet [id number] Take 1 tablet (5 mg total) by mouth every evening. PSusy Frizzle MD Taking Active Self  LINZESS 290 MCG CAPS capsule [id number] TAKE ONE CAPSULE BY  MOUTH EVERY MORNING PSusy Frizzle MD Taking Active   loperamide (IMODIUM) 2 MG capsule [id number] Take 2 mg by mouth 4 (four) times daily as needed. [provider] Taking Active   meloxicam (MOBIC) 15 MG tablet [id number] Take 15 mg by mouth at bedtime.  [provider] Taking Active Self  metoprolol succinate (TOPROL-XL) 25 MG 24 hr tablet [id number] Take 1 tablet (25 mg total) by mouth daily. BLorretta Harp MD Taking Active   Multiple Vitamins-Minerals (MULTIVITAMIN WITH MINERALS) tablet [id number] Take 1 tablet by mouth daily. [provider] Taking Active Self  naloxone ([facility] nasal spray 4 mg/0.1 mL [id number]  [provider] Taking Active   ondansetron (ZOFRAN) 4 MG tablet [id number] Take 1 tablet (4 mg total) by mouth every 8 (eight) hours as needed for nausea or vomiting. PSusy Frizzle MD Taking Active   OVER THE COUNTER MEDICATION [id number] Take 1 capsule by mouth daily. Fennel Seed [provider] Taking Active Self  oxyCODONE-acetaminophen (PERCOCET) 7.5-325 MG tablet [id number] Take 1 tablet by mouth 6 (six) times daily. [provider] Taking Active Self           Med Note ([place] MISTY D   Tue Jun 09, 2021  5:31 AM)    pantoprazole (PROTONIX) 40 MG tablet [id number] Take 1  tablet (40 mg total) by mouth 2 (two) times daily. [name], MD Taking Active Self  potassium chloride SA (KLOR-CON M) 20 MEQ tablet [id number] Yes TAKE 2 TABLETS BY MOUTH EVERY DAY [name], MD Taking Active   prazosin (MINIPRESS) 2 MG capsule [id number] Yes Take 2 mg by mouth at bedtime. [provider] Taking Active Self  pregabalin (LYRICA) 100 MG capsule [id number] Yes Take 1 capsule (100 mg total) by mouth 2 (two) times daily. [name], MD Taking Active   promethazine (PHENERGAN) 25 MG tablet [id number] Yes Take 1 tablet (25 mg total) by mouth every 8 (eight) hours as needed for  nausea or vomiting. [name], DPM Taking Active Self  solifenacin (VESICARE) 10 MG tablet [id number] Yes TAKE ONE TABLET BY MOUTH ONCE DAILY [name], MD Taking Active   sulfamethoxazole-trimethoprim (BACTRIM DS) 800-160 MG tablet [id number] Yes Take 1 tablet by mouth 2 (two) times daily. [name], DPM Taking Active   SUMAtriptan (IMITREX) 50 MG tablet [id number] Yes TAKE 1 TAB EVERY 2 HOURS AS NEEDED FOR MIGRAINE. MAY REPEAT IN 2 HOURS IF PERSISTS OR RECURS  Patient taking differently: Take 50 mg by mouth every 2 (two) hours as needed for migraine.   [name], MD Taking Active Self  topiramate (TOPAMAX) 50 MG tablet [id number] Yes Take 1 tablet (50 mg total) by mouth 2 (two) times daily. [name], MD Taking Active   traZODone (DESYREL) 100 MG tablet [id number] Yes Take 200 mg by mouth at bedtime. [provider] Taking Active Self           Med Note Lannette Donath   Tue Jun 09, 2021  5:32 AM)              Patient Active Problem List   Diagnosis Date Noted   Diarrhea in adult patient    Weakness 06/30/2021   Gastroenteritis, infectious 06/30/2021   AKI (acute kidney injury) ([place]) 06/30/2021   Hyponatremia 06/30/2021   Open wound of right foot, initial encounter 10/21/2020   Finger ulcer ([place]) 10/21/2020   Syncope 08/26/2019   Non-healing open wound of toe 05/15/2019   Osteomyelitis ([place]) 05/15/2019   Palpitations 10/26/2018   Foot ulcer ([place]) 01/31/2018   Hyperglycemia 01/31/2018   Left leg cellulitis 09/24/2017   Syncope and collapse    Near syncope 11/23/2016   Abdominal pain 09/23/2016   Anxiety 09/23/2016   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[place]) 09/23/2016   Constipation 09/23/2016   DDD (degenerative disc disease), lumbosacral 09/23/2016   Migraines 09/23/2016   Myelopathy ([place]) 09/23/2016   Nausea 09/23/2016   Post laminectomy syndrome 09/23/2016   Pseudoarthrosis of lumbar spine 09/23/2016   Shortness of breath  09/23/2016   Sleep apnea 09/23/2016   Spinal stenosis of lumbar region 09/23/2016   Vertigo 09/23/2016   Hallux malleus 11/15/2015   Chronic pain syndrome    GERD (gastroesophageal reflux disease)    Depression    Rash 11/14/2015   Toe ulcer, right ([place]) 04/11/2015   Cellulitis of toe of right foot 04/11/2015   Nonspecific chest pain    Chest pain 10/26/2014   Obesity (BMI 30-39.9) 10/26/2014   Hypokalemia 10/26/2014   Manic bipolar I disorder ([place]) 10/26/2014   Atypical angina ([place]) 10/26/2014   Tardive dyskinesia 08/26/2011   Manic bipolar I disorder with rapid cycling ([place]) 05/18/2011    Class: Acute   Asthma 02/24/2011   Essential hypertension 02/24/2011   History  of migraine headaches 02/24/2011    Immunization History  Administered Date(s) Administered   Influenza Split 01/17/2014, 01/08/2018   Influenza,inj,Quad PF,6+ Mos 02/10/2015, 05/05/2017, 12/21/2017, 02/08/2019, 02/08/2020, 05/12/2021   Influenza,inj,quad, With Preservative 02/10/2015, 05/05/2017, 12/21/2017, 02/08/2019   Influenza-Unspecified 01/17/2014, 01/08/2018, 02/08/2020   PNEUMOCOCCAL CONJUGATE-20 07/02/2021   Pneumococcal Polysaccharide-23 05/29/2015   Tdap 02/20/2018    Conditions to be addressed/monitored:  HTN, Migraines, Asthma, Bipolar Depression, Anxiety,   Care Plan : General Pharmacy (Adult)  Updates made by Edythe Clarity, RPH since 12/10/2021 12:00 AM     Problem: HTN, Migraines, Asthma, Bipolar Depression, Anxiety,   Priority: High  Onset Date: 05/29/2021     Long-Range Goal: Patient-Specific Goal   Start Date: 05/28/2021  Expected End Date: 11/26/2021  Recent Progress: On track  Priority: High  Note:   Current Barriers:  None at this time  Pharmacist Clinical Goal(s):  Patient will maintain control of BP and depression as evidenced by symptoms  through collaboration with PharmD and provider.   Interventions: 1:1 collaboration with [name], MD regarding  development and update of comprehensive plan of care as evidenced by provider attestation and co-signature Inter-disciplinary care team collaboration (see longitudinal plan of care) Comprehensive medication review performed; medication list updated in electronic medical record  Hypertension (BP goal <140/90) -Controlled -Current treatment: Metoprolol XL 74m daily Appropriate, Effective, Safe, Accessible -Medications previously tried: HCTZ  -Current home readings: 120s/75 -Current exercise habits: minimal -Denies hypotensive/hypertensive symptoms -Educated on BP goals and benefits of medications for prevention of heart attack, stroke and kidney damage; Importance of home blood pressure monitoring; Symptoms of hypotension and importance of maintaining adequate hydration; -Counseled to monitor BP at home a few times per week, document, and provide log at future appointments -Recommended to continue current medication BP controlled at home per her reports.  Asthma (Goal: control symptoms and prevent exacerbations) 12/10/21 -Controlled -Current treatment  Albuterol HFA 90 mcg prn Appropriate, Effective, Safe, Accessible -Medications previously tried: none noted  -Pulmonary function testing: Pulmonary Functions Testing Results: No results found for: FEV1, FVC, FEV1FVC, TLC, DLCO -Exacerbations requiring treatment in last 6 months: none -Patient denies consistent use of maintenance inhaler -Frequency of rescue inhaler use: as needed -Counseled on When to use rescue inhaler -She reports using her rescue inhaler about once weekly currently. Denies any episodes of SOB. No changes needed continue her current medication at this time.  Depression/Anxiety/Bipolar (Goal: Reduce/minimize symptoms) 12/10/21 -Controlled -Current treatment: Buspirone 148mTID Appropriate, Effective, Safe, Accessible Duloxetine 6075maily  Appropriate, Effective, Safe, Accessible Lexapro 78m12mily  Appropriate,  Effective, Safe, Accessible Haloperidol 5mg 67m Appropriate, Effective, Safe, Accessible Prazosin 2mg h16mAppropriate, Effective, Safe, Accessible Lamictal 100mg o22mdaily Appropriate, Effective, Safe, Effective -Medications previously tried/failed: none noted -PHQ9:     09/18/2021   10:12 AM 08/21/2020    2:15 PM 02/08/2020   11:38 AM  PHQ9 SCORE ONLY  PHQ-9 Total Score 0 0 0   Managed by psych, recently added Lamictal and it has been titrated up to 100mg. S38ms still having anger outbursts that she states she cannot control. Declines counseling at this time, discussed lifestyle mods such as exercise.  Denies any suicidal ideation or harmful thoughts. For now continue, recommend FU with psych if no improvement with increased lamictal.  Migraines (Goal: Reduce migraines) -Controlled -Current treatment  Aimovig 70mg onc71mnthly  Appropriate, Effective, Safe, Accessible Topamax 50mg BID 5mropriate, Effective, Safe, Accessible -Medications previously tried: none noted -Denies any migraines  in about 1 year  -Recommended to continue current medication Meds are all affordable as she has medicare/medicaid.  Patient Goals/Self-Care Activities Patient will:  - take medications as prescribed as evidenced by patient report and record review focus on medication adherence by med synch and delivery target a minimum of 150 minutes of moderate intensity exercise weekly  Follow Up Plan: The care management team will reach out to the patient again over the next 120 days.           Medication Assistance: None required.  Patient affirms current coverage meets needs.  Compliance/Adherence/Medication fill history: Care Gaps: None at this time  Star-Rating Drugs: N/A  Patient's preferred pharmacy is:  [facility], [place] [address] Phone: [phone] Fax: [phone]  [facility] -  [place], [place] - [address] [place] [address] Phone: [phone] Fax: [phone]  [id number] [address] [place]Phone: [phone]: [phone] [id number] [place] [address] [id number]: [phone]: [phone] pill box? Yes Pt endorses 100% compliance  We discussed: Benefits of medication synchronization, packaging and delivery as well as enhanced pharmacist oversight with [facility]. Patient decided to: Utilize [facility] for medication synchronization, packaging and delivery  Verbal consent obtained for [facility] enhanced pharmacy services (medication synchronization, adherence packaging, delivery coordination). A medication sync plan was created to allow patient to get all medications delivered once every 30 to 90 days per patient preference. Patient understands they have freedom to choose pharmacy and clinical pharmacist will coordinate care between all prescribers and [facility].  Care Plan and Follow Up Patient Decision:  Patient agrees to Care Plan and Follow-up.  Plan: The care management team will reach out to the patient again over the next 120 days.  ChBeverly MilchPharmD, CPP Clinical Pharmacist Practitioner [phone]

## 2021-11-30 ENCOUNTER — Ambulatory Visit (INDEPENDENT_AMBULATORY_CARE_PROVIDER_SITE_OTHER): Payer: Medicare Other | Admitting: Podiatry

## 2021-11-30 DIAGNOSIS — M898X9 Other specified disorders of bone, unspecified site: Secondary | ICD-10-CM

## 2021-11-30 DIAGNOSIS — G629 Polyneuropathy, unspecified: Secondary | ICD-10-CM | POA: Diagnosis not present

## 2021-11-30 DIAGNOSIS — L97512 Non-pressure chronic ulcer of other part of right foot with fat layer exposed: Secondary | ICD-10-CM | POA: Diagnosis not present

## 2021-11-30 DIAGNOSIS — R2681 Unsteadiness on feet: Secondary | ICD-10-CM | POA: Diagnosis not present

## 2021-12-01 DIAGNOSIS — M5116 Intervertebral disc disorders with radiculopathy, lumbar region: Secondary | ICD-10-CM | POA: Diagnosis not present

## 2021-12-02 NOTE — Progress Notes (Signed)
Subjective: [name] [name] is a 56 y.o. is seen today for follow-up evaluation of wound on the bottom of the right foot.  Since I saw her last she has been to the wound care center.  She has been released as the wound is healed.  She presents today to help with offloading to prevent reoccurrence or worsening.  Denies any fevers or chills.  No other concerns.   Objective: General: No acute distress, AAOx3 -presents in cam boot DP/PT pulses palpable 2/4, CRT < 3 sec to all digits.  Right foot: Previous transmetatarsal amputation.  Wound that was present previously appears to have healed.  There is new, healthy skin present and there is no edema, erythema or signs of infection.  No new lesions are noted. No pain with calf compression, swelling, warmth, erythema.    Assessment and Plan:  Ulceration right foot  -Treatment options discussed including all alternatives, risks, and complications -Wound is healed.  Discussed measures to help with it reoccurrence.  Prescription for [facility] was provided for AFO brace or custom insert.  We have tried inserts previously and it was not overly helpful so hopefully a brace or further advanced offloading may be beneficial for her. -A new cam boot was dispensed today as hers had broken. -She like to try a foot defender boot as well which I ordered.  -Daily foot inspection.  RTC 2 months or sooner if needed  *Of note I received a message from the wrap for the foot defender boot that she is out of network for this.  [name] DPM

## 2021-12-04 ENCOUNTER — Other Ambulatory Visit: Payer: Self-pay | Admitting: Orthopaedic Surgery

## 2021-12-04 DIAGNOSIS — Z01818 Encounter for other preprocedural examination: Secondary | ICD-10-CM

## 2021-12-04 DIAGNOSIS — M25511 Pain in right shoulder: Secondary | ICD-10-CM | POA: Diagnosis not present

## 2021-12-10 ENCOUNTER — Ambulatory Visit: Payer: Medicare Other | Admitting: Pharmacist

## 2021-12-10 DIAGNOSIS — F3131 Bipolar disorder, current episode depressed, mild: Secondary | ICD-10-CM

## 2021-12-10 DIAGNOSIS — J4521 Mild intermittent asthma with (acute) exacerbation: Secondary | ICD-10-CM

## 2021-12-10 LAB — HM DIABETES EYE EXAM

## 2021-12-10 NOTE — Patient Instructions (Addendum)
Visit Information   Goals Addressed             This Visit's Progress    Get Synchronized with [facility]   On track    Timeframe:  Long-Range Goal Priority:  High Start Date:       05/29/21                      Expected End Date:   11/26/21                    Follow Up Date 08/26/21    Work to get med synched and delivered through [facility] to help with adherence.   Why is this important?   These steps will help you keep on track with your medicines.   Notes:        Patient Care Plan: General Pharmacy (Adult)     Problem Identified: HTN, Migraines, Asthma, Bipolar Depression, Anxiety,   Priority: High  Onset Date: 05/29/2021     Long-Range Goal: Patient-Specific Goal   Start Date: 05/28/2021  Expected End Date: 11/26/2021  Recent Progress: On track  Priority: High  Note:   Current Barriers:  None at this time  Pharmacist Clinical Goal(s):  Patient will maintain control of BP and depression as evidenced by symptoms  through collaboration with PharmD and provider.   Interventions: 1:1 collaboration with [name], MD regarding development and update of comprehensive plan of care as evidenced by provider attestation and co-signature Inter-disciplinary care team collaboration (see longitudinal plan of care) Comprehensive medication review performed; medication list updated in electronic medical record  Hypertension (BP goal <140/90) -Controlled -Current treatment: Metoprolol XL '25mg'$  daily Appropriate, Effective, Safe, Accessible -Medications previously tried: HCTZ  -Current home readings: 120s/75 -Current exercise habits: minimal -Denies hypotensive/hypertensive symptoms -Educated on BP goals and benefits of medications for prevention of heart attack, stroke and kidney damage; Importance of home blood pressure monitoring; Symptoms of hypotension and importance of maintaining adequate hydration; -Counseled to monitor BP at home a few times per week,  document, and provide log at future appointments -Recommended to continue current medication BP controlled at home per her reports.  Asthma (Goal: control symptoms and prevent exacerbations) 12/10/21 -Controlled -Current treatment  Albuterol HFA 90 mcg prn Appropriate, Effective, Safe, Accessible -Medications previously tried: none noted  -Pulmonary function testing: Pulmonary Functions Testing Results: No results found for: FEV1, FVC, FEV1FVC, TLC, DLCO -Exacerbations requiring treatment in last 6 months: none -Patient denies consistent use of maintenance inhaler -Frequency of rescue inhaler use: as needed -Counseled on When to use rescue inhaler -She reports using her rescue inhaler about once weekly currently. Denies any episodes of SOB. No changes needed continue her current medication at this time.  Depression/Anxiety/Bipolar (Goal: Reduce/minimize symptoms) 12/10/21 -Controlled -Current treatment: Buspirone '15mg'$  TID Appropriate, Effective, Safe, Accessible Duloxetine '60mg'$  daily  Appropriate, Effective, Safe, Accessible Lexapro '10mg'$  daily  Appropriate, Effective, Safe, Accessible Haloperidol '5mg'$  hs  Appropriate, Effective, Safe, Accessible Prazosin '2mg'$  hs  Appropriate, Effective, Safe, Accessible Lamictal '100mg'$  once daily Appropriate, Effective, Safe, Effective -Medications previously tried/failed: none noted -PHQ9:     09/18/2021   10:12 AM 08/21/2020    2:15 PM 02/08/2020   11:38 AM  PHQ9 SCORE ONLY  PHQ-9 Total Score 0 0 0   Managed by psych, recently added Lamictal and it has been titrated up to '100mg'$ . She is still having anger outbursts that she states she cannot control. Declines counseling at this time, discussed lifestyle  mods such as exercise.  Denies any suicidal ideation or harmful thoughts. For now continue, recommend FU with psych if no improvement with increased lamictal.  Migraines (Goal: Reduce migraines) -Controlled -Current treatment  Aimovig '70mg'$  once  monthly  Appropriate, Effective, Safe, Accessible Topamax '50mg'$  BID  Appropriate, Effective, Safe, Accessible -Medications previously tried: none noted -Denies any migraines in about 1 year  -Recommended to continue current medication Meds are all affordable as she has medicare/medicaid.  Patient Goals/Self-Care Activities Patient will:  - take medications as prescribed as evidenced by patient report and record review focus on medication adherence by med synch and delivery target a minimum of 150 minutes of moderate intensity exercise weekly  Follow Up Plan: The care management team will reach out to the patient again over the next 120 days.          The patient verbalized understanding of instructions, educational materials, and care plan provided today and DECLINED offer to receive copy of patient instructions, educational materials, and care plan.  Telephone follow up appointment with pharmacy team member scheduled for:   Edythe Clarity, [facility]

## 2021-12-14 ENCOUNTER — Telehealth: Payer: Self-pay

## 2021-12-14 NOTE — Telephone Encounter (Signed)
Pt called stating that pt has a bad cough/sinuses   Would like if you can call something into [facility]

## 2021-12-16 ENCOUNTER — Other Ambulatory Visit: Payer: Medicare Other

## 2021-12-16 NOTE — Telephone Encounter (Signed)
Called pt today, per Dr. Dennard Schaumann, he recommend trying coricidan hbp for URI OTC.  Can use delsym for cough.     Pt voiced understanding. Nothing further needed.

## 2021-12-23 ENCOUNTER — Inpatient Hospital Stay: Admission: RE | Admit: 2021-12-23 | Payer: Medicare Other | Source: Ambulatory Visit

## 2021-12-29 DIAGNOSIS — M25519 Pain in unspecified shoulder: Secondary | ICD-10-CM | POA: Diagnosis not present

## 2021-12-30 ENCOUNTER — Ambulatory Visit
Admission: RE | Admit: 2021-12-30 | Discharge: 2021-12-30 | Disposition: A | Discharge status: Home / Self Care | Payer: Medicare Other | Source: Ambulatory Visit | Attending: Orthopaedic Surgery | Admitting: Orthopaedic Surgery

## 2021-12-30 DIAGNOSIS — Z01818 Encounter for other preprocedural examination: Secondary | ICD-10-CM

## 2021-12-30 DIAGNOSIS — M25511 Pain in right shoulder: Secondary | ICD-10-CM | POA: Diagnosis not present

## 2022-01-18 IMAGING — DX DG FINGER MIDDLE 2+V*R*
3 series · 3 of 3 positions shown · non-contrast
Comparison: None.

CLINICAL DATA: Wound to the third digit. Wound in the distal aspect
for 2 weeks.

EXAM:
RIGHT MIDDLE FINGER 2+V

[finger ap]
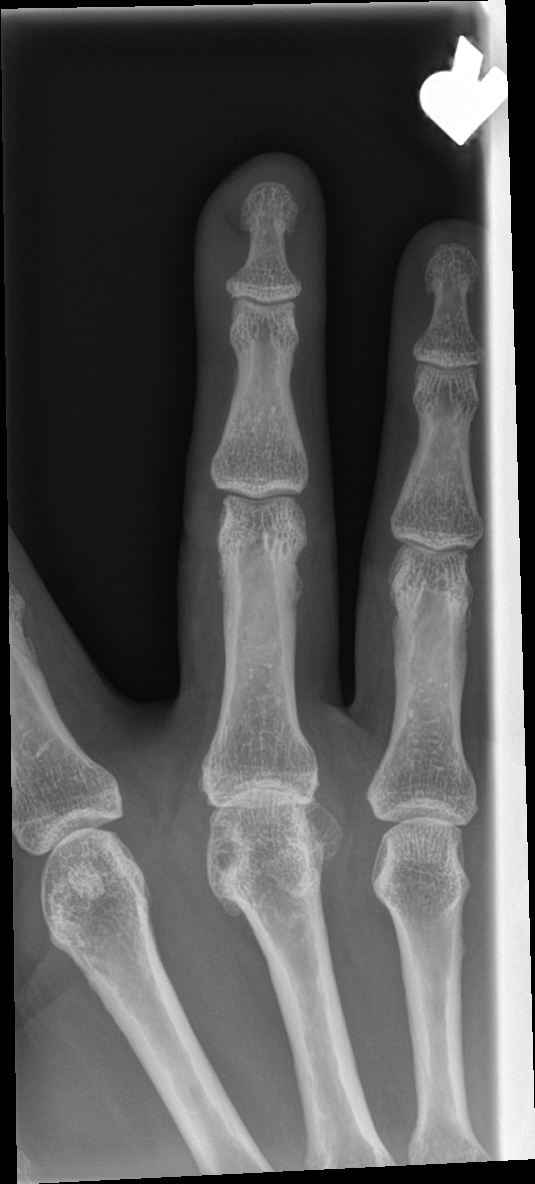

[finger obl]
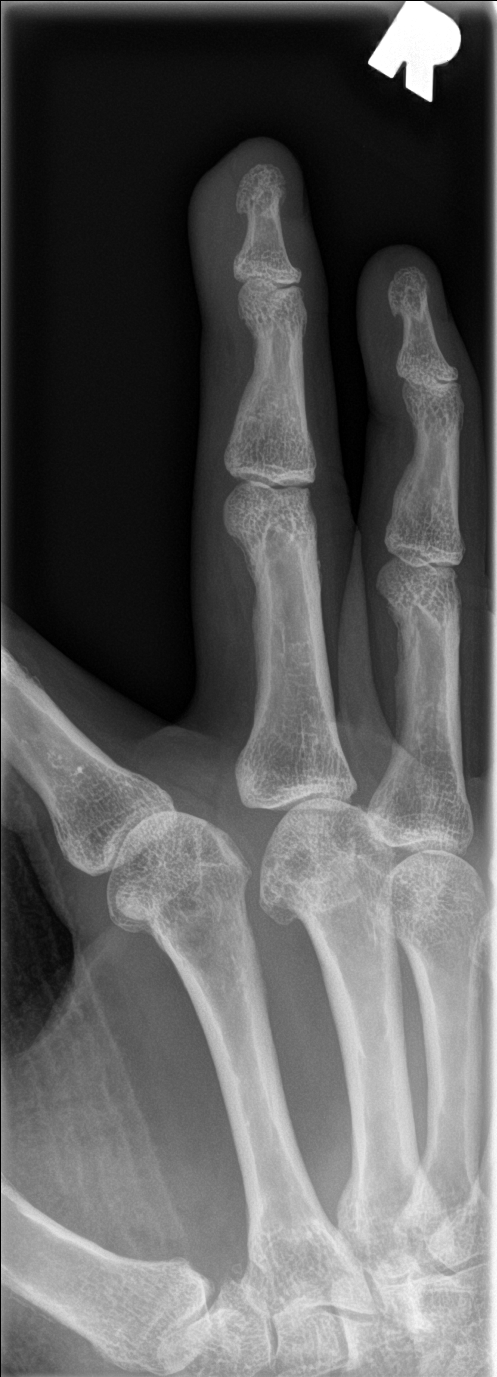

[finger lat]
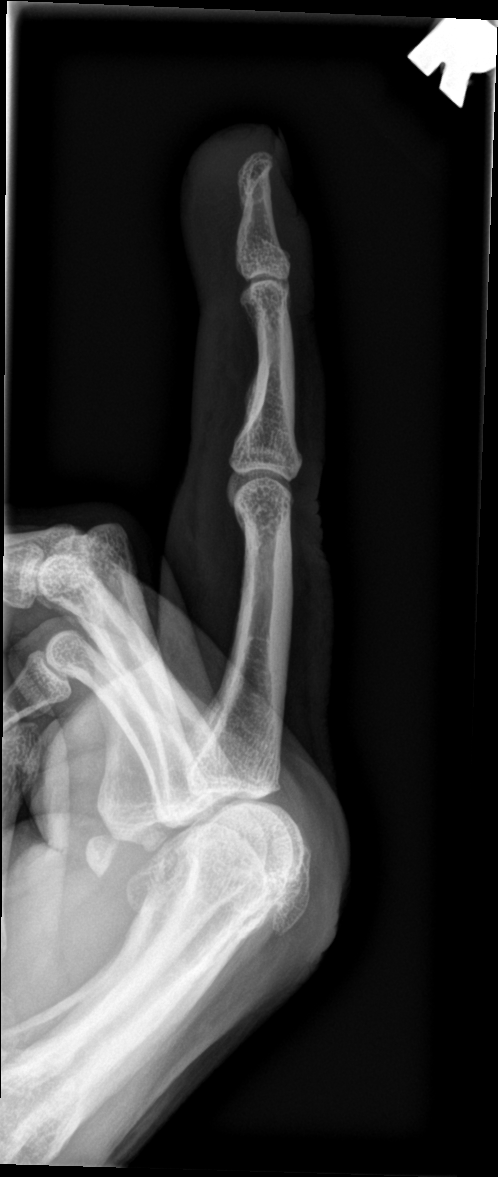

[3 of 3 positions shown; findings below may reference images not displayed]

FINDINGS: There is no evidence of fracture or dislocation. No erosion,
periosteal reaction or bone destruction. Osteoarthritis is most
prominently involving the metacarpal phalangeal joint. Skin
irregularity of the distal digit. No tracking soft tissue air. No
radiopaque foreign body.
IMPRESSION: Soft tissue irregularity of the distal digit. No radiopaque foreign
body or acute osseous abnormality. Osteoarthritis.

## 2022-01-18 IMAGING — DX DG FOOT COMPLETE 3+V*R*
3 series · 3 of 3 positions shown · non-contrast
Comparison: Most recent radiograph 05/18/2019

CLINICAL DATA: Foot ulcer with concern for infection.

EXAM:
RIGHT FOOT COMPLETE - 3+ VIEW

[foot ap]
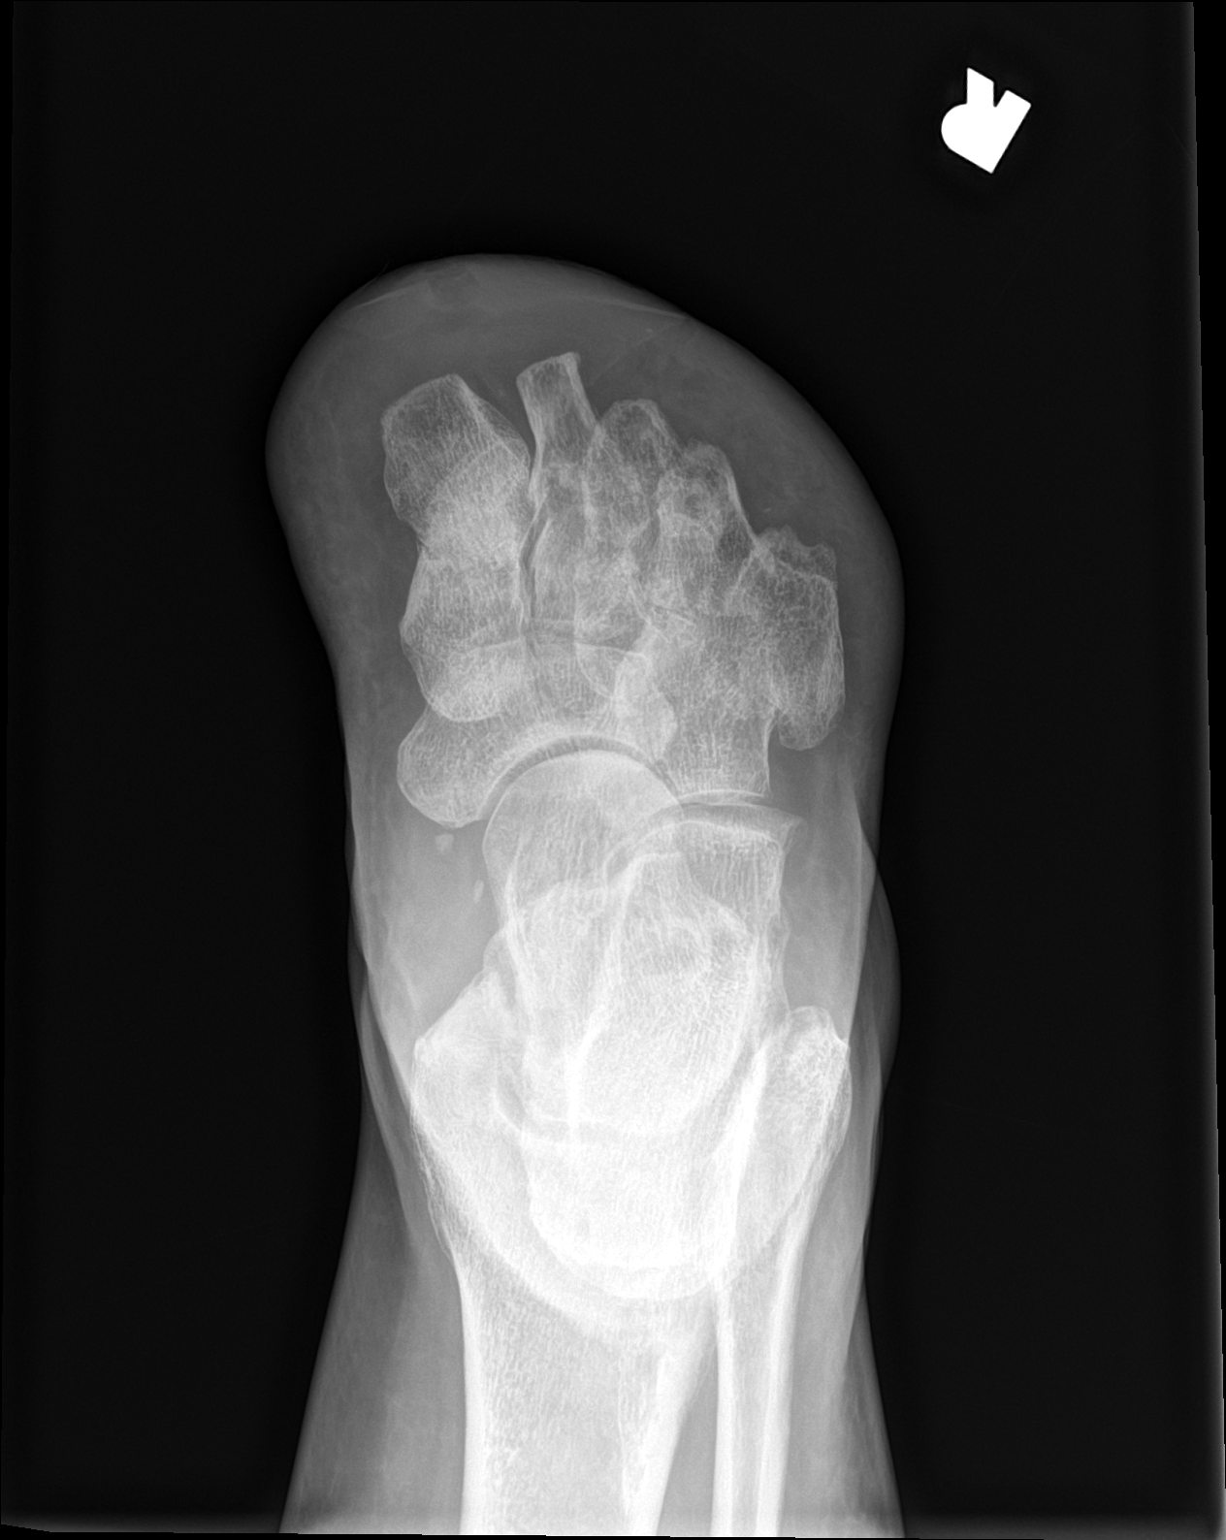

[foot obl]
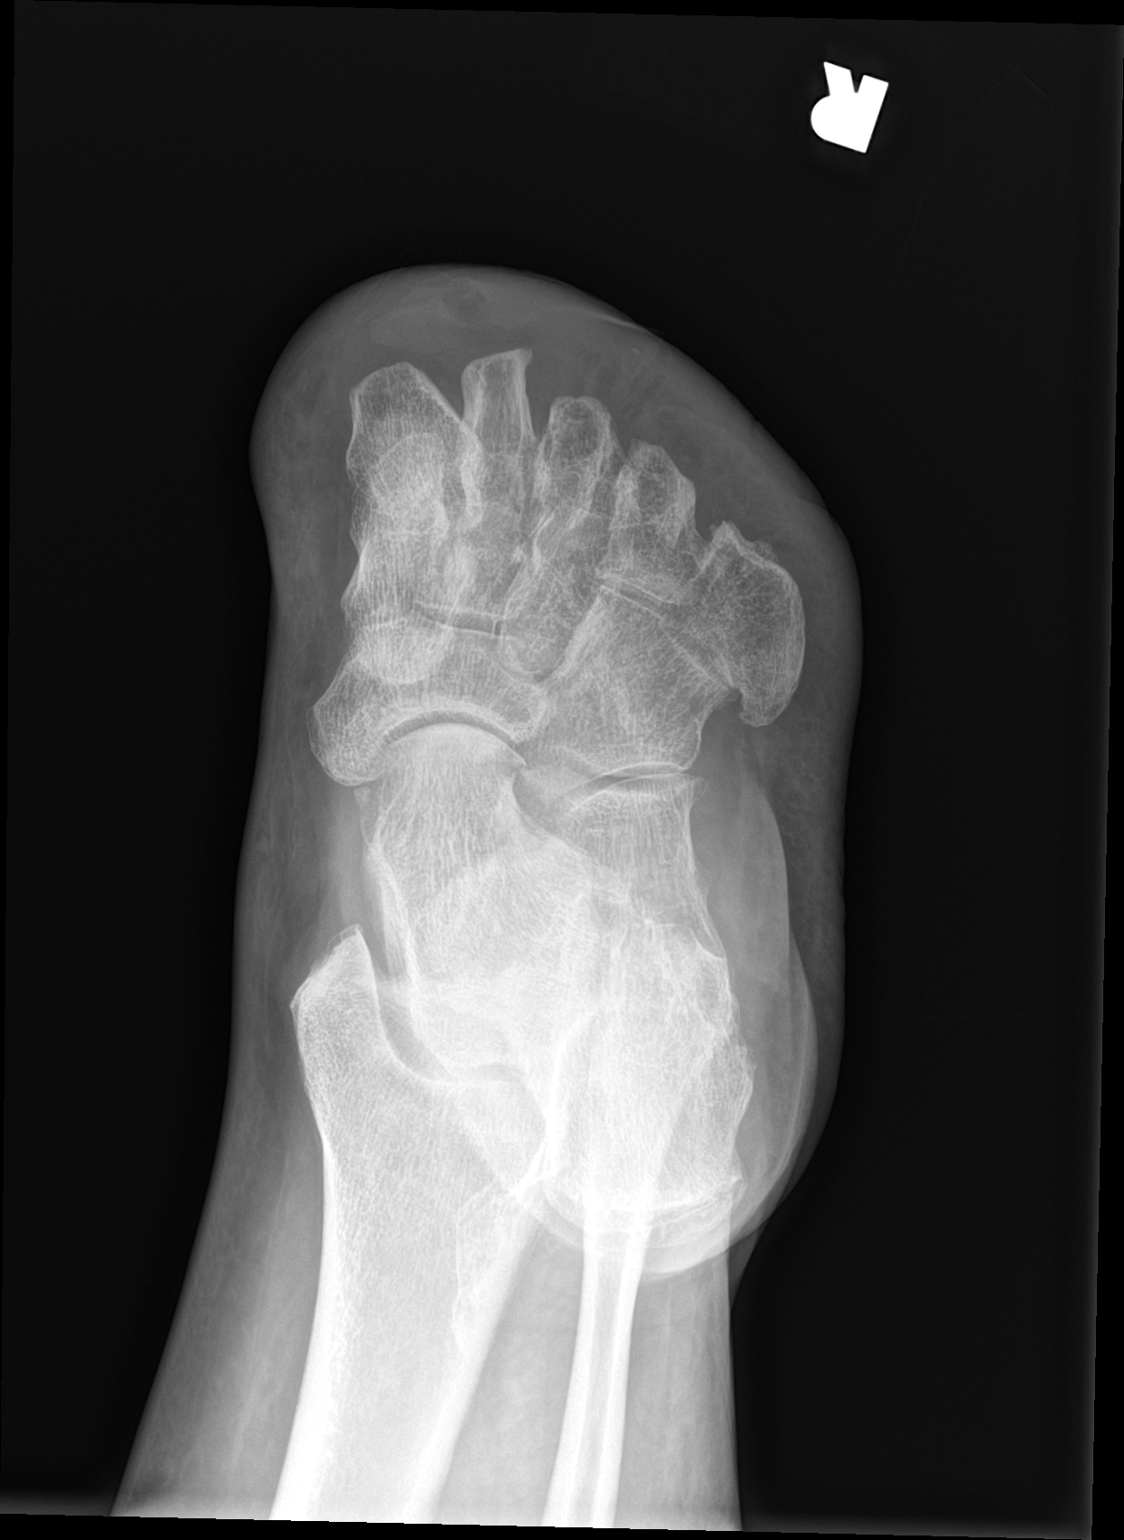

[foot lat]
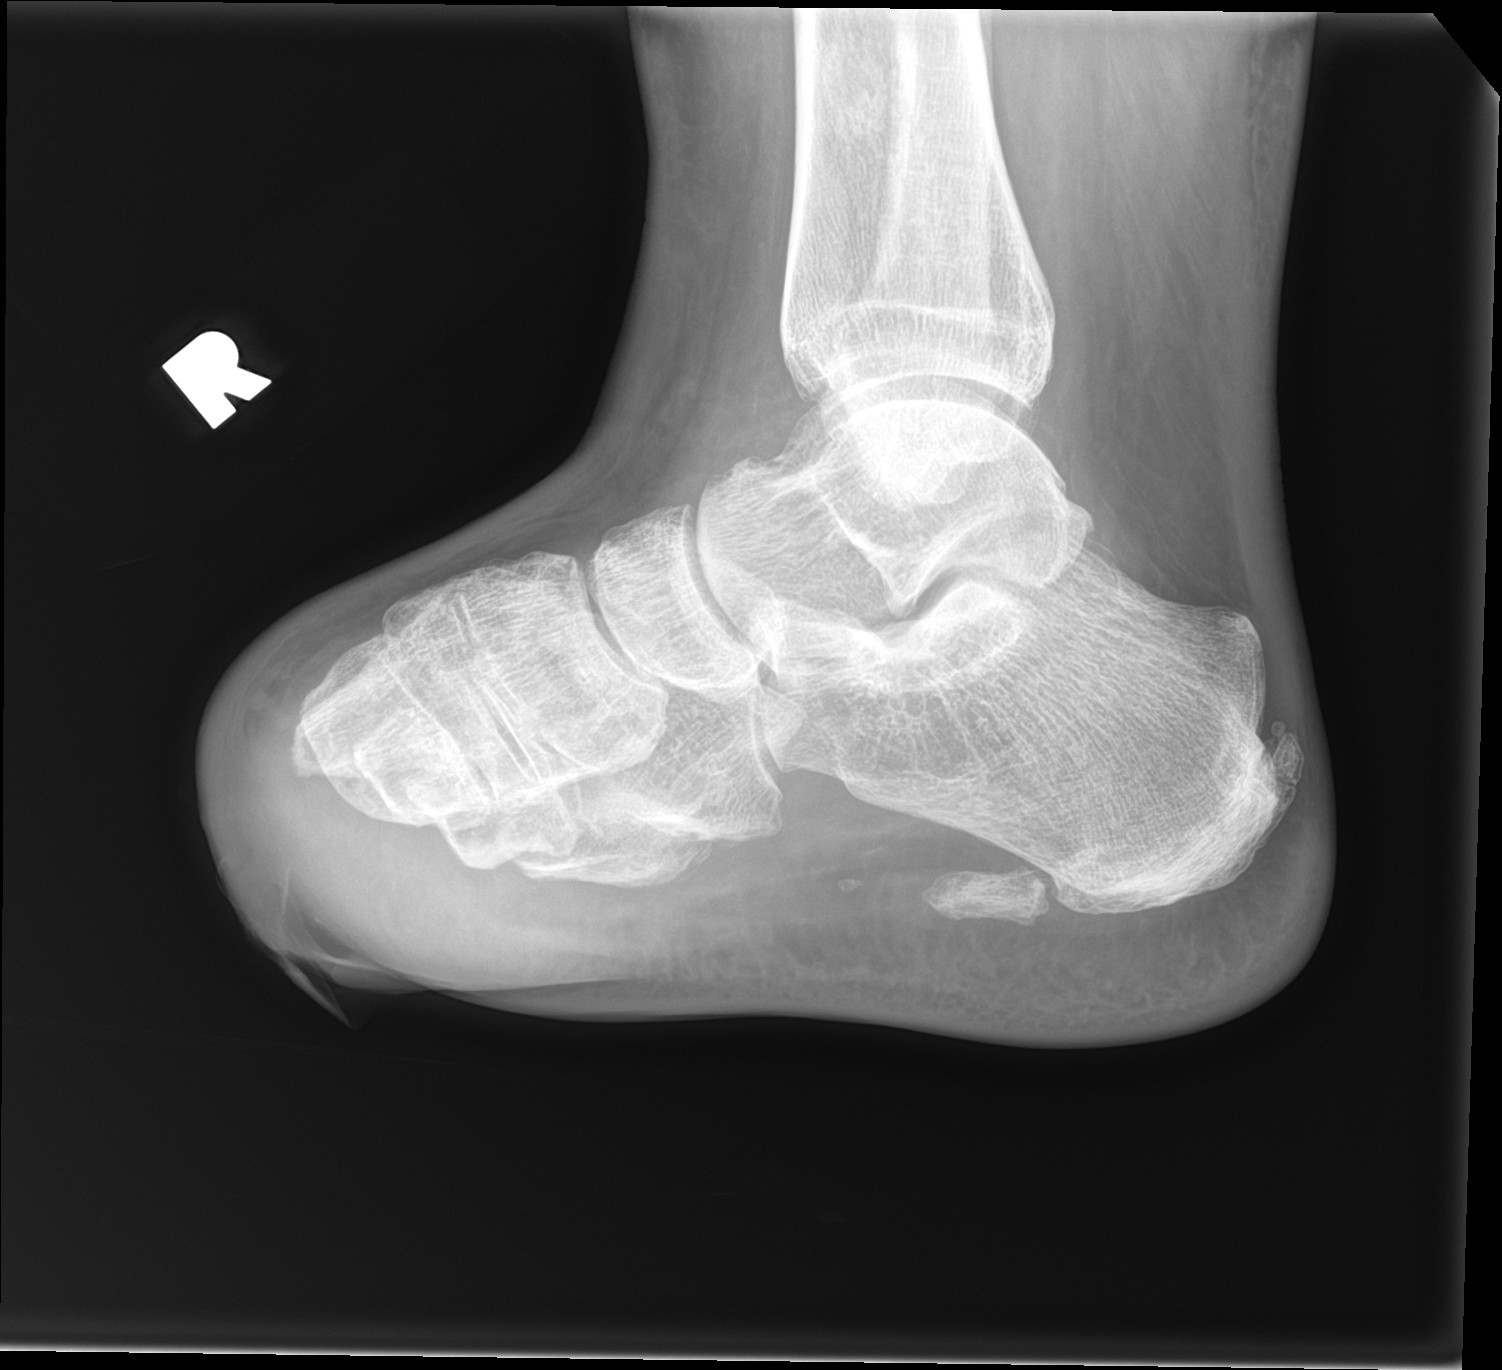

[3 of 3 positions shown; findings below may reference images not displayed]

FINDINGS: Five ray transmetatarsal amputation. Resection margins are smooth.
There is no erosion, bone destruction or periosteal reaction. Mild
soft tissue prominence about the distal stump with questionable
ulcer overlying the region of the second metatarsal. No tracking
soft tissue air. No radiopaque foreign body. No fracture. Achilles
tendon enthesophyte and plantar fascial calcifications again seen.
IMPRESSION: 1. Previous transmetatarsal amputation without radiographic findings
of osteomyelitis.
2. Soft tissue prominence about the distal stump with questionable
ulcer overlying the region of the second metatarsal.

## 2022-01-20 DIAGNOSIS — M25552 Pain in left hip: Secondary | ICD-10-CM | POA: Diagnosis not present

## 2022-01-20 DIAGNOSIS — M1612 Unilateral primary osteoarthritis, left hip: Secondary | ICD-10-CM | POA: Diagnosis not present

## 2022-01-20 IMAGING — MR MR FOOT*R* W/O CM
5 series · 40 of 40 positions shown · non-contrast
Comparison: Right foot x-rays dated October 20, 2020. MRI right foot
dated May 16, 2019.

CLINICAL DATA: Right foot stump infection.

EXAM:
MRI OF THE RIGHT FOREFOOT WITHOUT CONTRAST
TECHNIQUE: Multiplanar, multisequence MR imaging of the right but was
performed. No intravenous contrast was administered.

[Series 3: T1 · axial · right · 3.0mm · 0.70mm/px · z∈[-56,+39]mm · 7 of 25 slices shown (1 of 2)]
[im 1/25]
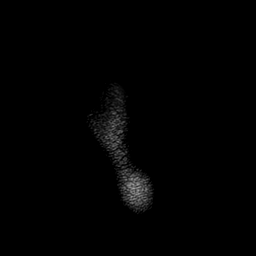
[im 5/25]
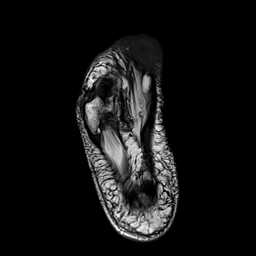
[im 9/25]
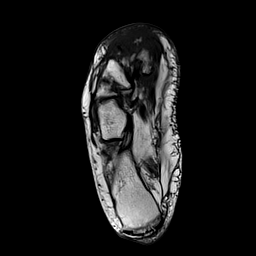
[im 13/25]
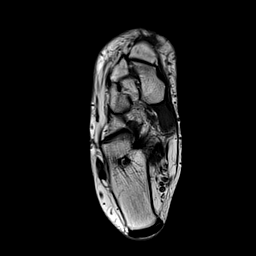
[im 17/25]
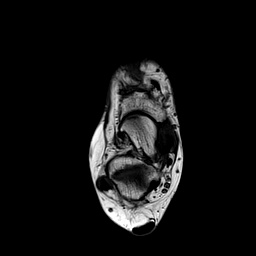
[im 21/25]
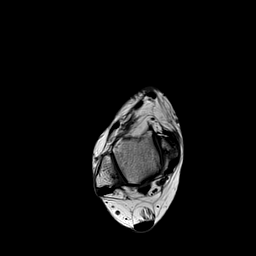
[im 25/25]
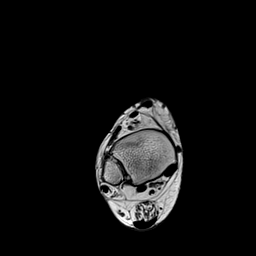

[Series 4: T2 · axial · right · 3.0mm · 0.70mm/px · z∈[-53,+42]mm · 6 of 25 slices shown]
[im 1/25]
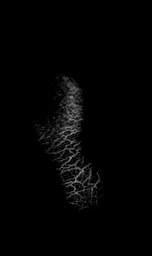
[im 5/25]
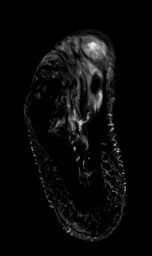
[im 10/25]
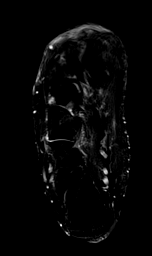
[im 15/25]
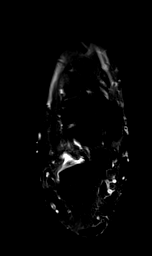
[im 20/25]
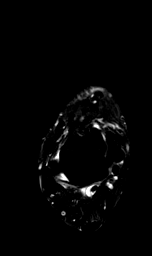
[im 25/25]
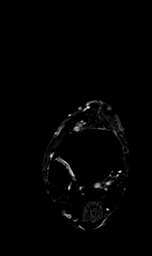

[Series 5: T1 · coronal · right · 3.0mm · 0.39mm/px · 11 of 47 slices shown (2 of 2)]
[im 1/47]
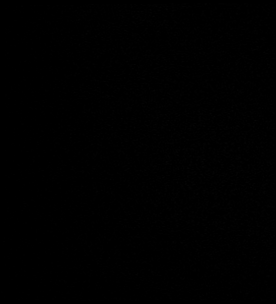
[im 5/47]
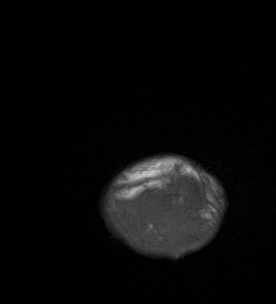
[im 10/47]
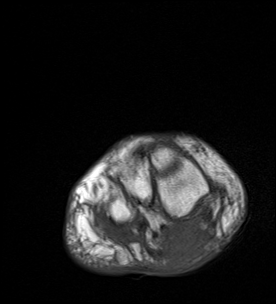
[im 14/47]
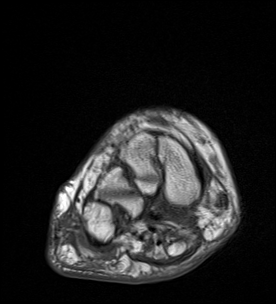
[im 19/47]
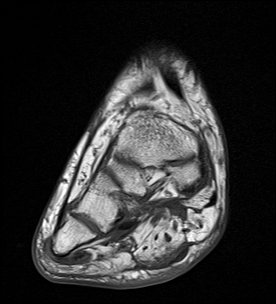
[im 24/47]
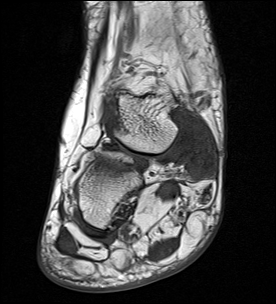
[im 28/47]
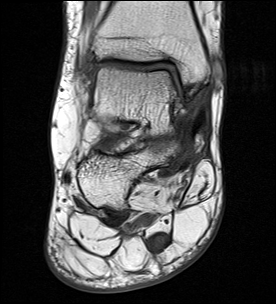
[im 33/47]
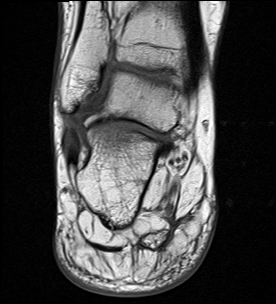
[im 37/47]
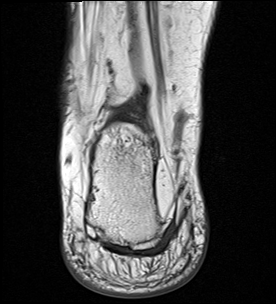
[im 42/47]
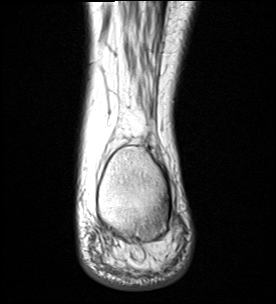
[im 47/47]
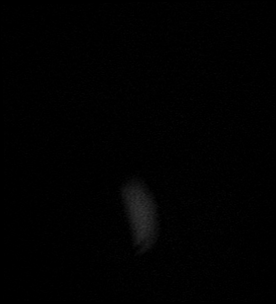

[Series 6: T2 fat-sat · coronal · right · 3.0mm · 0.70mm/px · 11 of 48 slices shown]
[im 1/48]
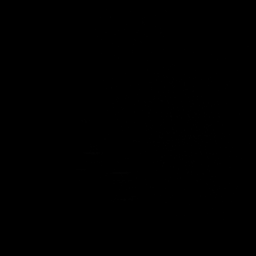
[im 5/48]
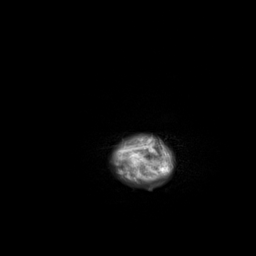
[im 10/48]
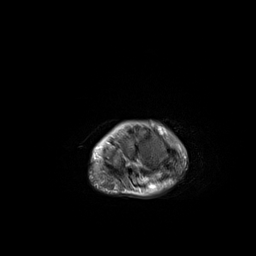
[im 15/48]
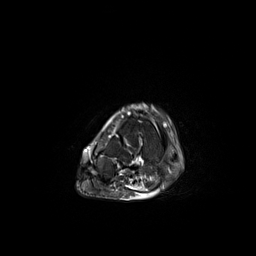
[im 19/48]
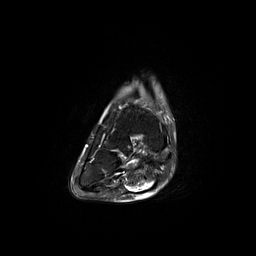
[im 24/48]
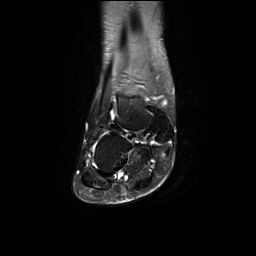
[im 29/48]
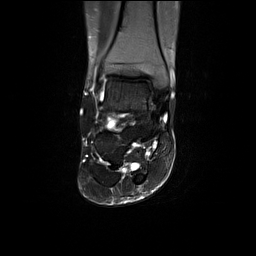
[im 33/48]
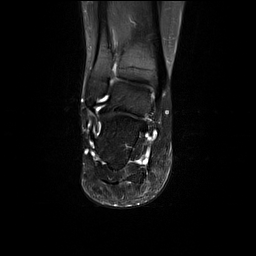
[im 38/48]
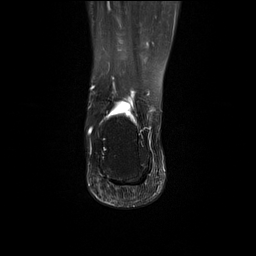
[im 43/48]
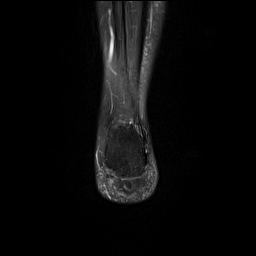
[im 48/48]
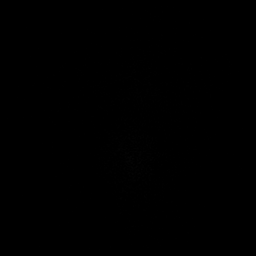

[Series 8: STIR · sagittal · right · 3.0mm · 0.74mm/px · 5 of 22 slices shown]
[im 1/22]
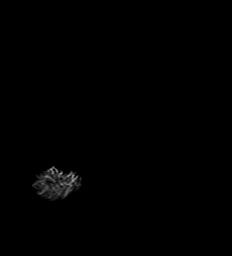
[im 6/22]
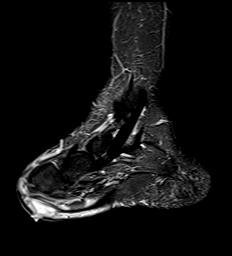
[im 11/22]
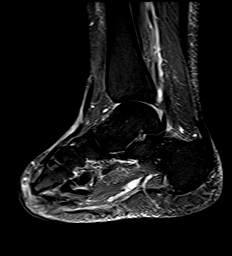
[im 16/22]
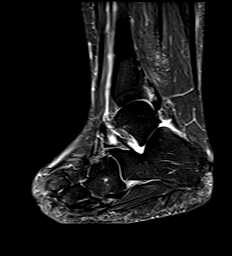
[im 22/22]
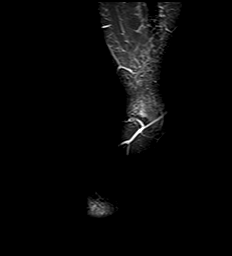

[40 of 40 positions shown; findings below may reference images not displayed]

FINDINGS: Bones/Joint/Cartilage

Prior transmetatarsal amputation. No marrow signal abnormality. No
fracture or dislocation. Joint spaces are preserved. Small posterior
subtalar joint effusion.

Ligaments

Medial and lateral ankle ligaments are intact.

Muscles and Tendons
Split tear of the peroneal brevis tendon. Remaining tendons are
intact.

Soft tissue
Soft tissue swelling of the amputation stump with small ulceration
along the medial aspect. Suspected developing fluid collection
measuring 1.8 x 1.9 x 2.0 cm (series 4, image 22) draining to the
ulcer.
IMPRESSION: 1. Prior transmetatarsal amputation. Soft tissue swelling of the
amputation stump with small ulceration along the medial aspect.
Suspected developing 1.8 x 1.9 x 2.0 cm abscess draining to the
ulcer.
2. No evidence of osteomyelitis.
3. Split tear of the peroneal brevis tendon.

## 2022-01-21 ENCOUNTER — Other Ambulatory Visit: Payer: Self-pay | Admitting: *Deleted

## 2022-01-21 ENCOUNTER — Telehealth: Payer: Self-pay | Admitting: Pharmacist

## 2022-01-21 MED ORDER — SOLIFENACIN SUCCINATE 10 MG PO TABS: 10.0000 mg | ORAL_TABLET | Freq: Every day | ORAL | 0 refills | Status: DC

## 2022-01-21 MED ORDER — POTASSIUM CHLORIDE CRYS ER 20 MEQ PO TBCR: EXTENDED_RELEASE_TABLET | ORAL | 0 refills | Status: DC

## 2022-01-21 NOTE — Telephone Encounter (Signed)
-----   Message from April D Calhoun sent at 01/21/2022 10:25 AM EDT ----- Regarding: Rx refill request Patient has an upcoimg delivery with [facility]. She will need refills on the following medications to complete her order:  Potassium chloride 20 meq; two tablets once daily Solifenacin 10 mg once daily Pantoprazole 40 mg twice daily  Please send refills to [facility].  Thank you,  April D Calhoun, [place] Pharmacist Assistant [phone]

## 2022-01-21 NOTE — Progress Notes (Addendum)
Chronic Care Management Pharmacy Assistant   Name: [name] [name]  MRN: [id number] DOB: 1966-01-27   Reason for Encounter: Medication Coordination Call   Recent office visits:  None  Recent consult visits:  None  Hospital visits:  None in previous 6 months  Medications: Outpatient Encounter Medications as of 01/21/2022  Medication Sig   AIMOVIG 70 MG/ML SOAJ ADMINISTER 1 ML UNDER THE SKIN EVERY 30 DAYS   albuterol (PROAIR HFA) 108 (90 Base) MCG/ACT inhaler Inhale 2 puffs = 175mg into the lungs every 6 (six) hours as needed for wheezing or shortness of breath.   busPIRone (BUSPAR) 15 MG tablet Take 15 mg by mouth 3 (three) times daily.    Calcium Carbonate-Vit D-Min (CALCIUM 1200 PO) Take 1,200 mg by mouth daily.    cyclobenzaprine (FLEXERIL) 10 MG tablet Take 10 mg by mouth 2 (two) times daily as needed for muscle spasms.    DULoxetine (CYMBALTA) 60 MG capsule TAKE 1 CAPSULE BY MOUTH EVERY DAY   escitalopram (LEXAPRO) 10 MG tablet Take 10 mg by mouth daily.   estradiol (ESTRACE) 1 MG tablet Take 1 tablet (1 mg total) by mouth at bedtime.   famotidine (PEPCID) 40 MG tablet Take 1 tablet (40 mg total) by mouth at bedtime.   fluticasone (FLONASE) 50 MCG/ACT nasal spray SPRAY 2 SPRAYS INTRO EACH NOSTRIL EVERY DAY (Patient taking differently: Place 2 sprays into both nostrils daily as needed for allergies.)   haloperidol (HALDOL) 5 MG tablet Take 1 tablet (5 mg total) by mouth at bedtime. For psychosis   lamoTRIgine (LAMICTAL) 25 MG tablet Take by mouth.   levocetirizine (XYZAL) 5 MG tablet Take 1 tablet (5 mg total) by mouth every evening.   LINZESS 290 MCG CAPS capsule TAKE ONE CAPSULE BY MOUTH EVERY MORNING   loperamide (IMODIUM) 2 MG capsule Take 2 mg by mouth 4 (four) times daily as needed.   meloxicam (MOBIC) 15 MG tablet Take 15 mg by mouth at bedtime.    metoprolol succinate (TOPROL-XL) 25 MG 24 hr tablet Take 1 tablet (25 mg total) by mouth daily.   Multiple  Vitamins-Minerals (MULTIVITAMIN WITH MINERALS) tablet Take 1 tablet by mouth daily.   naloxone (NARCAN) nasal spray 4 mg/0.1 mL    ondansetron (ZOFRAN) 4 MG tablet Take 1 tablet (4 mg total) by mouth every 8 (eight) hours as needed for nausea or vomiting.   OVER THE COUNTER MEDICATION Take 1 capsule by mouth daily. Fennel Seed   oxyCODONE-acetaminophen (PERCOCET) 7.5-325 MG tablet Take 1 tablet by mouth 6 (six) times daily.   pantoprazole (PROTONIX) 40 MG tablet Take 1 tablet (40 mg total) by mouth 2 (two) times daily.   potassium chloride SA (KLOR-CON M) 20 MEQ tablet TAKE 2 TABLETS BY MOUTH EVERY DAY   prazosin (MINIPRESS) 2 MG capsule Take 2 mg by mouth at bedtime.   pregabalin (LYRICA) 100 MG capsule Take 1 capsule (100 mg total) by mouth 2 (two) times daily.   promethazine (PHENERGAN) 25 MG tablet Take 1 tablet (25 mg total) by mouth every 8 (eight) hours as needed for nausea or vomiting.   solifenacin (VESICARE) 10 MG tablet TAKE ONE TABLET BY MOUTH ONCE DAILY   sulfamethoxazole-trimethoprim (BACTRIM DS) 800-160 MG tablet Take 1 tablet by mouth 2 (two) times daily.   SUMAtriptan (IMITREX) 50 MG tablet TAKE 1 TAB EVERY 2 HOURS AS NEEDED FOR MIGRAINE. MAY REPEAT IN 2 HOURS IF PERSISTS OR RECURS (Patient taking differently: Take 50 mg by mouth  every 2 (two) hours as needed for migraine.)   topiramate (TOPAMAX) 50 MG tablet Take 1 tablet (50 mg total) by mouth 2 (two) times daily.   traZODone (DESYREL) 100 MG tablet Take 200 mg by mouth at bedtime.   No facility-administered encounter medications on file as of 01/21/2022.   Reviewed chart for medication changes ahead of medication coordination call.  BP Readings from Last 3 Encounters:  09/18/21 136/80  07/17/21 122/76  07/02/21 125/74    Lab Results  Component Value Date   HGBA1C 5.6 08/26/2019     Patient obtains medications through [facility]  90 Days   Last adherence delivery included: Potassium chloride 20 meq; two tablets once  daily Meloxicam 15 mg once daily Solifenacin 10 mg once daily Estradiol 1 mg once daily Cyclobenzaprine 10 mg twice daily as needed Famotidine 40 mg at bedtime Pantoprazole 40 mg twice daily Duloxetine 60 mg once daily Pregabalin 100 mg Levocetirizine 5 mg once daily Aimovig 70 mg Linzess 290 mcg once daily Topiramate 50 mg twice daily Prazosin 2 mg at bedtime Haloperidol 5 mg at bedtime Escitalopram 10 mg once daily Metoprolol 25 mg three tablets daily Buspirone 15 mg three times daily Trazodone 100 mg two tablets at bedtime as needed   Patient is due for next adherence delivery on: 01/22/2022. Called patient and reviewed medications and coordinated delivery.  This delivery to include: Potassium chloride 20 meq; two tablets once daily Meloxicam 15 mg once daily Solifenacin 10 mg once daily Estradiol 1 mg once daily Cyclobenzaprine 10 mg twice daily as needed Famotidine 40 mg at bedtime Pantoprazole 40 mg twice daily Duloxetine 60 mg once daily Pregabalin 100 mg Levocetirizine 5 mg once daily Lamotrigine '100mg'$  daily Topiramate 50 mg twice daily Prazosin 2 mg at bedtime Haloperidol 5 mg at bedtime Escitalopram 10 mg once daily Metoprolol 25 mg three tablets daily Buspirone 15 mg three times daily Trazodone 100 mg two tablets at bedtime as   Patient declined the following medications - Linzess, per patient no longer taking.  Patient needs refills for: Potassium chloride 20 meq; two tablets once daily Solifenacin 10 mg once daily Pantoprazole 40 mg twice daily  Confirmed delivery date of 01/22/2022, advised patient that pharmacy will contact them the morning of delivery.  Care Gaps: Medicare Annual Wellness: Completed 08/21/2020 Hemoglobin A1C: 5.6% on 08/26/2019 Colonoscopy: Overdue since 04/19/2021 Mammogram: Next due on 12/04/2022  Future Appointments  Date Time Provider [place]  02/01/2022  2:15 PM [name], DPM [facility]   06/17/2022  2:45 PM [facility] PHARMACIST [facility]   April D Calhoun, [place] Pharmacist Assistant [phone]

## 2022-01-21 NOTE — Telephone Encounter (Signed)
Requested Prescriptions  Pending Prescriptions Disp Refills  . potassium chloride SA (KLOR-CON M) 20 MEQ tablet 180 tablet 0    Sig: TAKE 2 TABLETS BY MOUTH EVERY DAY     Endocrinology:  Minerals - Potassium Supplementation Passed - 01/21/2022 10:34 AM      Passed - K in normal range and within 360 days    Potassium  Date Value Ref Range Status  09/18/2021 4.7 3.5 - 5.3 mmol/L Final         Passed - Cr in normal range and within 360 days    Creat  Date Value Ref Range Status  09/18/2021 0.78 0.50 - 1.03 mg/dL Final         Passed - Valid encounter within last 12 months    Recent Outpatient Visits          4 months ago Benign essential HTN   [place], Cammie Mcgee, MD   6 months ago Infectious diarrhea   [place] [name], MD   8 months ago Blood in stool, frank   [place] Dennard Schaumann, Cammie Mcgee, MD   1 year ago Ulcer of right foot with fat layer exposed ([place])   [place] Dennard Schaumann, Cammie Mcgee, MD   1 year ago Upper respiratory tract infection, unspecified type   [place] Noemi [place] A, NP             . solifenacin (VESICARE) 10 MG tablet 90 tablet 0    Sig: Take 1 tablet (10 mg total) by mouth daily.     Urology:  Bladder Agents 2 Passed - 01/21/2022 10:34 AM      Passed - Cr in normal range and within 360 days    Creat  Date Value Ref Range Status  09/18/2021 0.78 0.50 - 1.03 mg/dL Final         Passed - ALT in normal range and within 360 days    ALT  Date Value Ref Range Status  09/18/2021 11 6 - 29 U/L Final         Passed - AST in normal range and within 360 days    AST  Date Value Ref Range Status  09/18/2021 12 10 - 35 U/L Final         Passed - Valid encounter within last 12 months    Recent Outpatient Visits          4 months ago Benign essential HTN   [place] Pickard, Cammie Mcgee, MD   6 months ago Infectious diarrhea    [place] Dennard Schaumann, Cammie Mcgee, MD   8 months ago Blood in stool, frank   [place] Dennard Schaumann, Cammie Mcgee, MD   1 year ago Ulcer of right foot with fat layer exposed ([place])   [place] [name], MD   1 year ago Upper respiratory tract infection, unspecified type   [place], Jessica A, NP             . pantoprazole (PROTONIX) 40 MG tablet 180 tablet 2    Sig: Take 1 tablet (40 mg total) by mouth 2 (two) times daily.     Gastroenterology: Proton Pump Inhibitors Passed - 01/21/2022 10:34 AM      Passed - Valid encounter within last 12 months    Recent Outpatient Visits  4 months ago Benign essential HTN   [place] Pickard, Cammie Mcgee, MD   6 months ago Infectious diarrhea   [place] Dennard Schaumann, Cammie Mcgee, MD   8 months ago Blood in stool, frank   [place] Dennard Schaumann, Cammie Mcgee, MD   1 year ago Ulcer of right foot with fat layer exposed ([place])   [place] Pickard, Cammie Mcgee, MD   1 year ago Upper respiratory tract infection, unspecified type   [place] Eulogio Bear, NP

## 2022-01-26 DIAGNOSIS — Z79891 Long term (current) use of opiate analgesic: Secondary | ICD-10-CM | POA: Diagnosis not present

## 2022-01-26 DIAGNOSIS — M25559 Pain in unspecified hip: Secondary | ICD-10-CM | POA: Diagnosis not present

## 2022-01-28 ENCOUNTER — Telehealth: Payer: Self-pay | Admitting: Podiatry

## 2022-01-28 ENCOUNTER — Other Ambulatory Visit: Payer: Self-pay | Admitting: Family Medicine

## 2022-01-28 NOTE — Telephone Encounter (Signed)
Unable to refill per protocol, Rx request is too soon. Last RF E-Prescribing Status: Receipt confirmed by pharmacy (06/19/2021 4:31 PM EST) for 90 and 2 rf. Will refuse.    Requested Prescriptions  Pending Prescriptions Disp Refills  . pantoprazole (PROTONIX) 40 MG tablet [Pharmacy Med Name: pantoprazole 40 mg tablet,delayed release] 180 tablet 2    Sig: TAKE ONE TABLET BY MOUTH TWICE DAILY     Gastroenterology: Proton Pump Inhibitors Passed - 01/28/2022  9:45 AM      Passed - Valid encounter within last 12 months    Recent Outpatient Visits          4 months ago Benign essential HTN   [place], Cammie Mcgee, MD   6 months ago Infectious diarrhea   [place] [name], MD   8 months ago Blood in stool, frank   [place] Dennard Schaumann, Cammie Mcgee, MD   1 year ago Ulcer of right foot with fat layer exposed ([place])   [place] Pickard, Cammie Mcgee, MD   1 year ago Upper respiratory tract infection, unspecified type   [place] Eulogio Bear, NP      Future Appointments            In 4 days Pickard, Cammie Mcgee, MD [place]

## 2022-01-28 NOTE — Telephone Encounter (Signed)
Pt called to reschedule her appt from 10.16.2023 and I offered 10.14 but she said to move it for a couple of weeks out. I r/s for 11.3.2023.

## 2022-01-29 DIAGNOSIS — M1612 Unilateral primary osteoarthritis, left hip: Secondary | ICD-10-CM | POA: Diagnosis not present

## 2022-02-01 ENCOUNTER — Ambulatory Visit: Payer: Medicare Other | Admitting: Podiatry

## 2022-02-01 ENCOUNTER — Ambulatory Visit (INDEPENDENT_AMBULATORY_CARE_PROVIDER_SITE_OTHER): Payer: Medicare Other | Admitting: Family Medicine

## 2022-02-01 VITALS — BP 120/70 | HR 77 | Ht 64.0 in | Wt 227.0 lb

## 2022-02-01 DIAGNOSIS — G4710 Hypersomnia, unspecified: Secondary | ICD-10-CM | POA: Diagnosis not present

## 2022-02-01 DIAGNOSIS — Z23 Encounter for immunization: Secondary | ICD-10-CM | POA: Diagnosis not present

## 2022-02-01 DIAGNOSIS — R5383 Other fatigue: Secondary | ICD-10-CM | POA: Diagnosis not present

## 2022-02-01 DIAGNOSIS — G4733 Obstructive sleep apnea (adult) (pediatric): Secondary | ICD-10-CM

## 2022-02-01 DIAGNOSIS — L649 Androgenic alopecia, unspecified: Secondary | ICD-10-CM | POA: Diagnosis not present
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
## 2022-02-02 ENCOUNTER — Other Ambulatory Visit: Payer: Self-pay

## 2022-02-02 DIAGNOSIS — K219 Gastro-esophageal reflux disease without esophagitis: Secondary | ICD-10-CM

## 2022-02-02 LAB — TSH: TSH: 2.41 mIU/L (ref 0.40–4.50)

## 2022-02-02 LAB — CBC WITH DIFFERENTIAL/PLATELET
Absolute Monocytes: 409 cells/uL (ref 200–950)
Basophils Absolute: 27 cells/uL (ref 0–200)
Basophils Relative: 0.4 %
Eosinophils Absolute: 80 cells/uL (ref 15–500)
Eosinophils Relative: 1.2 %
HCT: 39.8 % (ref 35.0–45.0)
Hemoglobin: 13.5 g/dL (ref 11.7–15.5)
Lymphs Abs: 2151 cells/uL (ref 850–3900)
MCH: 33.1 pg — ABNORMAL HIGH (ref 27.0–33.0)
MCHC: 33.9 g/dL (ref 32.0–36.0)
MCV: 97.5 fL (ref 80.0–100.0)
MPV: 9.2 fL (ref 7.5–12.5)
Monocytes Relative: 6.1 %
Neutro Abs: 4033 cells/uL (ref 1500–7800)
Neutrophils Relative %: 60.2 %
Platelets: 303 10*3/uL (ref 140–400)
RBC: 4.08 10*6/uL (ref 3.80–5.10)
RDW: 12.6 % (ref 11.0–15.0)
Total Lymphocyte: 32.1 %
WBC: 6.7 10*3/uL (ref 3.8–10.8)

## 2022-02-02 LAB — COMPLETE METABOLIC PANEL WITH GFR
AG Ratio: 1.5 (calc) (ref 1.0–2.5)
ALT: 9 U/L (ref 6–29)
AST: 11 U/L (ref 10–35)
Albumin: 4.4 g/dL (ref 3.6–5.1)
Alkaline phosphatase (APISO): 91 U/L (ref 37–153)
BUN: 16 mg/dL (ref 7–25)
CO2: 24 mmol/L (ref 20–32)
Calcium: 9.8 mg/dL (ref 8.6–10.4)
Chloride: 104 mmol/L (ref 98–110)
Creat: 0.63 mg/dL (ref 0.50–1.03)
Globulin: 2.9 g/dL (calc) (ref 1.9–3.7)
Glucose, Bld: 93 mg/dL (ref 65–99)
Potassium: 4.6 mmol/L (ref 3.5–5.3)
Sodium: 137 mmol/L (ref 135–146)
Total Bilirubin: 0.3 mg/dL (ref 0.2–1.2)
Total Protein: 7.3 g/dL (ref 6.1–8.1)
eGFR: 103 mL/min/{1.73_m2} (ref >=60)

## 2022-02-02 LAB — VITAMIN B12: Vitamin B-12: 764 pg/mL (ref 200–1100)

## 2022-02-02 MED ORDER — PANTOPRAZOLE SODIUM 40 MG PO TBEC: 40.0000 mg | DELAYED_RELEASE_TABLET | Freq: Two times a day (BID) | ORAL | 3 refills | Status: DC

## 2022-02-03 ENCOUNTER — Other Ambulatory Visit: Payer: Self-pay

## 2022-02-03 MED ORDER — SPIRONOLACTONE 50 MG PO TABS: 50.0000 mg | ORAL_TABLET | Freq: Every day | ORAL | 1 refills | Status: DC

## 2022-02-08 ENCOUNTER — Other Ambulatory Visit: Payer: Self-pay | Admitting: Family Medicine

## 2022-02-12 DIAGNOSIS — M25371 Other instability, right ankle: Secondary | ICD-10-CM | POA: Diagnosis not present

## 2022-02-12 DIAGNOSIS — Z89431 Acquired absence of right foot: Secondary | ICD-10-CM | POA: Diagnosis not present

## 2022-02-19 ENCOUNTER — Other Ambulatory Visit: Payer: Medicare Other

## 2022-02-19 DIAGNOSIS — I10 Essential (primary) hypertension: Secondary | ICD-10-CM

## 2022-02-19 DIAGNOSIS — E876 Hypokalemia: Secondary | ICD-10-CM | POA: Diagnosis not present

## 2022-02-20 LAB — COMPREHENSIVE METABOLIC PANEL
AG Ratio: 1.3 (calc) (ref 1.0–2.5)
ALT: 10 U/L (ref 6–29)
AST: 11 U/L (ref 10–35)
Albumin: 4 g/dL (ref 3.6–5.1)
Alkaline phosphatase (APISO): 80 U/L (ref 37–153)
BUN: 13 mg/dL (ref 7–25)
CO2: 27 mmol/L (ref 20–32)
Calcium: 9.4 mg/dL (ref 8.6–10.4)
Chloride: 100 mmol/L (ref 98–110)
Creat: 0.81 mg/dL (ref 0.50–1.03)
Globulin: 3 g/dL (calc) (ref 1.9–3.7)
Glucose, Bld: 107 mg/dL — ABNORMAL HIGH (ref 65–99)
Potassium: 4.1 mmol/L (ref 3.5–5.3)
Sodium: 136 mmol/L (ref 135–146)
Total Bilirubin: 0.2 mg/dL (ref 0.2–1.2)
Total Protein: 7 g/dL (ref 6.1–8.1)

## 2022-02-22 ENCOUNTER — Ambulatory Visit (INDEPENDENT_AMBULATORY_CARE_PROVIDER_SITE_OTHER): Payer: Medicare Other

## 2022-02-22 ENCOUNTER — Ambulatory Visit (INDEPENDENT_AMBULATORY_CARE_PROVIDER_SITE_OTHER): Payer: Medicare Other | Admitting: Podiatry

## 2022-02-22 DIAGNOSIS — L600 Ingrowing nail: Secondary | ICD-10-CM

## 2022-02-22 DIAGNOSIS — M79672 Pain in left foot: Secondary | ICD-10-CM

## 2022-02-22 DIAGNOSIS — L97529 Non-pressure chronic ulcer of other part of left foot with unspecified severity: Secondary | ICD-10-CM

## 2022-02-22 DIAGNOSIS — L03032 Cellulitis of left toe: Secondary | ICD-10-CM

## 2022-02-22 MED ORDER — SULFAMETHOXAZOLE-TRIMETHOPRIM 800-160 MG PO TABS: 1.0000 | ORAL_TABLET | Freq: Two times a day (BID) | ORAL | 0 refills | Status: DC

## 2022-02-22 NOTE — Patient Instructions (Signed)
Soak Instructions   HAVE SOMEONE ELSE CHECK THE TEMPERATURE OF THE WATER TO MAKE SURE IT IS NOT TOO HOT OR COLD.   THE DAY AFTER THE PROCEDURE  Place 1/4 cup of epsom salts in a quart of warm tap water.  Submerge your foot or feet with outer bandage intact for the initial soak; this will allow the bandage to become moist and wet for easy lift off.  Once you remove your bandage, continue to soak in the solution for 10 minutes.  This soak should be done twice a day.  Next, remove your foot or feet from solution, blot dry the affected area and cover.  You may use a band aid large enough to cover the area or use gauze and tape.  Apply other medications to the area as directed by the doctor such as polysporin neosporin.  IF YOUR SKIN BECOMES IRRITATED WHILE USING THESE INSTRUCTIONS, IT IS OKAY TO SWITCH TO  WHITE VINEGAR AND WATER. Or you may use antibacterial soap and water to keep the toe clean  Monitor for any signs/symptoms of infection. Call the office immediately if any occur or go directly to the emergency room. Call with any questions/concerns.

## 2022-02-22 NOTE — Progress Notes (Signed)
Subjective:  Chief Complaint  Patient presents with   Foot Injury    Left foot hallux injury, started 4 days when the patient kicked the wall,     56 year old female presents the above concerns.  She stubbed her toe and part of the nail come off. This occurred on Thursday. She has been using neosporin and bandage. She had noticed some drainage/pus from around the toenail.   Right foot is doing "great".   Objective: AAO x3, NAD DP/PT pulses palpable bilaterally, CRT less than 3 seconds Incurvation present along aspect there is localized edema and erythema.  There is dried blood around the nail border as well.  No ascending cellulitis.  No fluctuance or crepitation. No pain with calf compression, swelling, warmth, erythema  Assessment: Left medial ingrown, infection  Plan: -All treatment options discussed with the patient including all alternatives, risks, complications.  -At this time, recommended partial nail removal without chemical matricectomy to the right medial due to infection. Risks and complications were discussed with the patient for which they understand and  verbally consent to the procedure. Under sterile conditions a total of 3 mL of a mixture of 2% lidocaine plain and 0.5% Marcaine plain was infiltrated in a hallux block fashion. Once anesthetized, the skin was prepped in sterile fashion. A tourniquet was then applied. Next the medial border of the hallux nail border was sharply excised making sure to remove the entire offending nail border.  A wound culture.  Once the nail was removed, the area was debrided and the underlying skin was intact. The area was irrigated and hemostasis was obtained.  A dry sterile dressing was applied. After application of the dressing the tourniquet was removed and there is found to be an immediate capillary refill time to the digit. The patient tolerated the procedure well any complications. Post procedure instructions were discussed the patient for  which he verbally understood. Follow-up in one week for nail check or sooner if any problems are to arise. Discussed signs/symptoms of worsening infection and directed to call the office immediately should any occur or go directly to the emergency room. In the meantime, encouraged to call the office with any questions, concerns, changes symptoms. -Bactrim -Patient encouraged to call the office with any questions, concerns, change in symptoms.   [name] DPM

## 2022-02-24 DIAGNOSIS — M25559 Pain in unspecified hip: Secondary | ICD-10-CM | POA: Diagnosis not present

## 2022-02-24 DIAGNOSIS — Z79891 Long term (current) use of opiate analgesic: Secondary | ICD-10-CM | POA: Diagnosis not present

## 2022-03-01 ENCOUNTER — Ambulatory Visit (INDEPENDENT_AMBULATORY_CARE_PROVIDER_SITE_OTHER): Payer: Medicare Other | Admitting: Podiatry

## 2022-03-01 ENCOUNTER — Telehealth: Payer: Self-pay

## 2022-03-01 DIAGNOSIS — L03032 Cellulitis of left toe: Secondary | ICD-10-CM

## 2022-03-01 DIAGNOSIS — L97511 Non-pressure chronic ulcer of other part of right foot limited to breakdown of skin: Secondary | ICD-10-CM

## 2022-03-01 DIAGNOSIS — L600 Ingrowing nail: Secondary | ICD-10-CM

## 2022-03-01 MED ORDER — CLINDAMYCIN HCL 300 MG PO CAPS: 300.0000 mg | ORAL_CAPSULE | Freq: Three times a day (TID) | ORAL | 0 refills | Status: DC

## 2022-03-01 NOTE — Telephone Encounter (Signed)
   Name: ASTON LAWHORN  DOB: November 06, 1965  MRN: [id number]  Primary Cardiologist: Quay Burow, MD  Chart reviewed as part of pre-operative protocol coverage. Because of Wilbur Labuda [place]'s past medical history and time since last visit, she will require a follow-up in-office visit in order to better assess preoperative cardiovascular risk.  Pre-op covering staff: - Please schedule appointment and call patient to inform them. If patient already had an upcoming appointment within acceptable timeframe, please add "pre-op clearance" to the appointment notes so provider is aware. - Please contact requesting surgeon's office via preferred method (i.e, phone, fax) to inform them of need for appointment prior to surgery.  No medications indicated as needing held.  Elgie Collard, PA-C  03/01/2022, 12:33 PM

## 2022-03-01 NOTE — Telephone Encounter (Signed)
..     Pre-operative Risk Assessment    Patient Name: [name] [name]  DOB: 08-24-1965 MRN: [id number]      Request for Surgical Clearance    Procedure:   right total shoulder arthroplasty  Date of Surgery:  Clearance TBD                                 Surgeon:  dax Griffin Basil md Surgeon's Group or Practice Name:  [facility] Phone number:  [phone] Fax number:  [phone]   Type of Clearance Requested:   - Medical    Type of Anesthesia:  General    Additional requests/questions:    [name]   03/01/2022, 9:42 AM

## 2022-03-01 NOTE — Telephone Encounter (Signed)
Pt has been scheduled to see Dr. [name] 03/03/22 @ 1:30 at the [facility] location. Pt thanked me for the help.

## 2022-03-03 ENCOUNTER — Encounter: Payer: Self-pay | Admitting: Cardiovascular Disease

## 2022-03-03 ENCOUNTER — Other Ambulatory Visit: Payer: Self-pay

## 2022-03-03 ENCOUNTER — Ambulatory Visit: Payer: Medicare Other | Attending: Cardiovascular Disease | Admitting: Cardiovascular Disease

## 2022-03-03 VITALS — BP 124/72 | HR 89 | Ht 64.0 in | Wt 238.0 lb

## 2022-03-03 DIAGNOSIS — I2089 Other forms of angina pectoris: Secondary | ICD-10-CM

## 2022-03-03 DIAGNOSIS — R002 Palpitations: Secondary | ICD-10-CM | POA: Diagnosis not present

## 2022-03-03 DIAGNOSIS — L600 Ingrowing nail: Secondary | ICD-10-CM

## 2022-03-03 IMAGING — MG DIGITAL SCREENING BILAT W/ CAD
4 series · 4 of 4 positions shown · non-contrast
Comparison: Previous exam(s).

CLINICAL DATA: Screening.

EXAM:
DIGITAL SCREENING BILATERAL MAMMOGRAM WITH CAD
TECHNIQUE: Bilateral screening digital craniocaudal and mediolateral oblique
mammograms were obtained. The images were evaluated with
computer-aided detection.

[R CC]
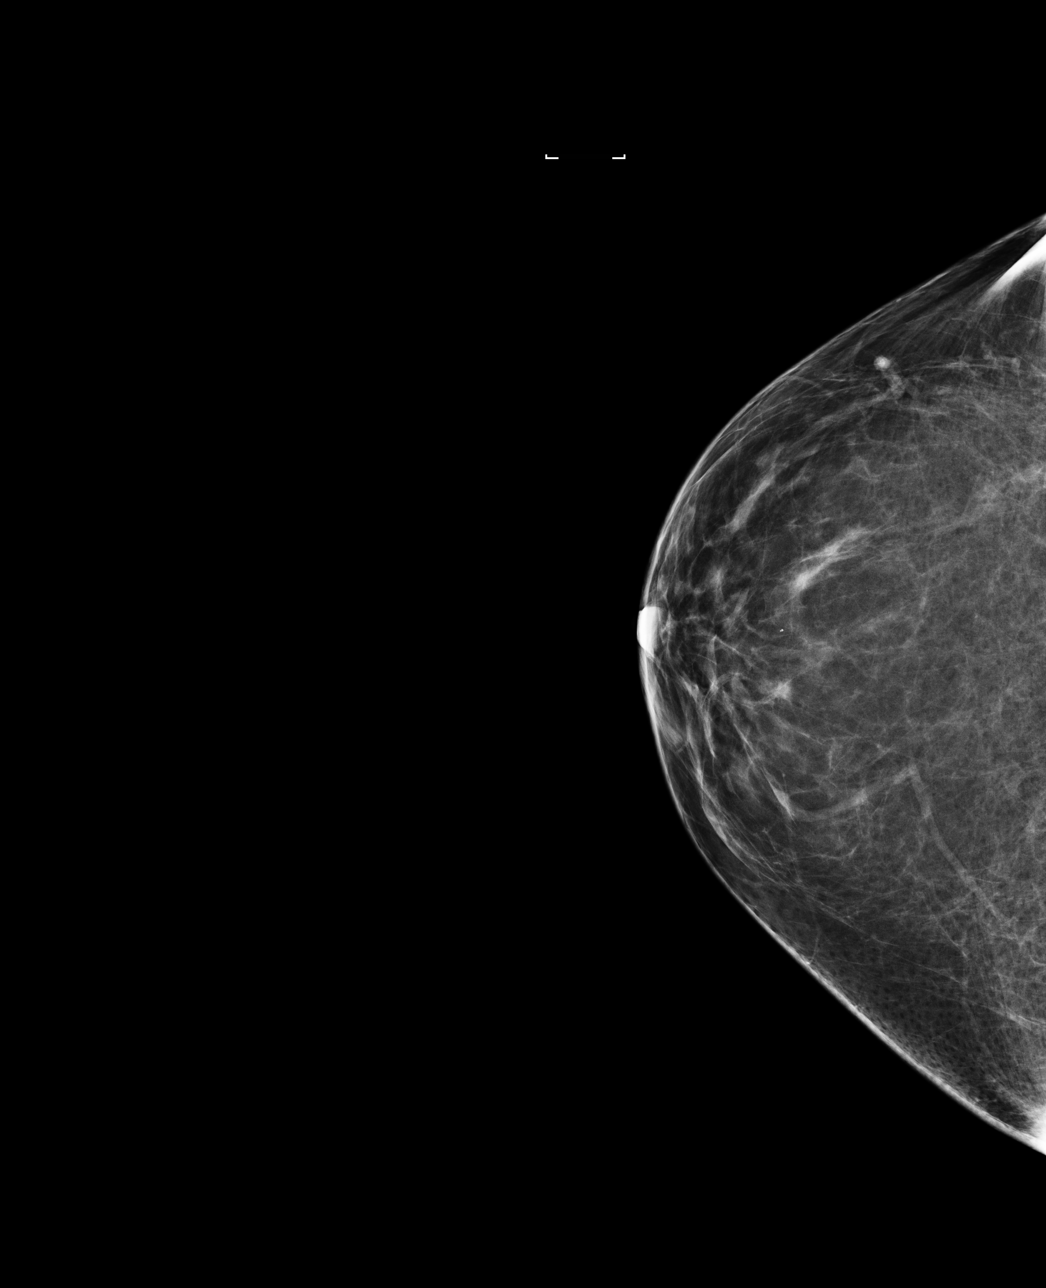

[L MLO]
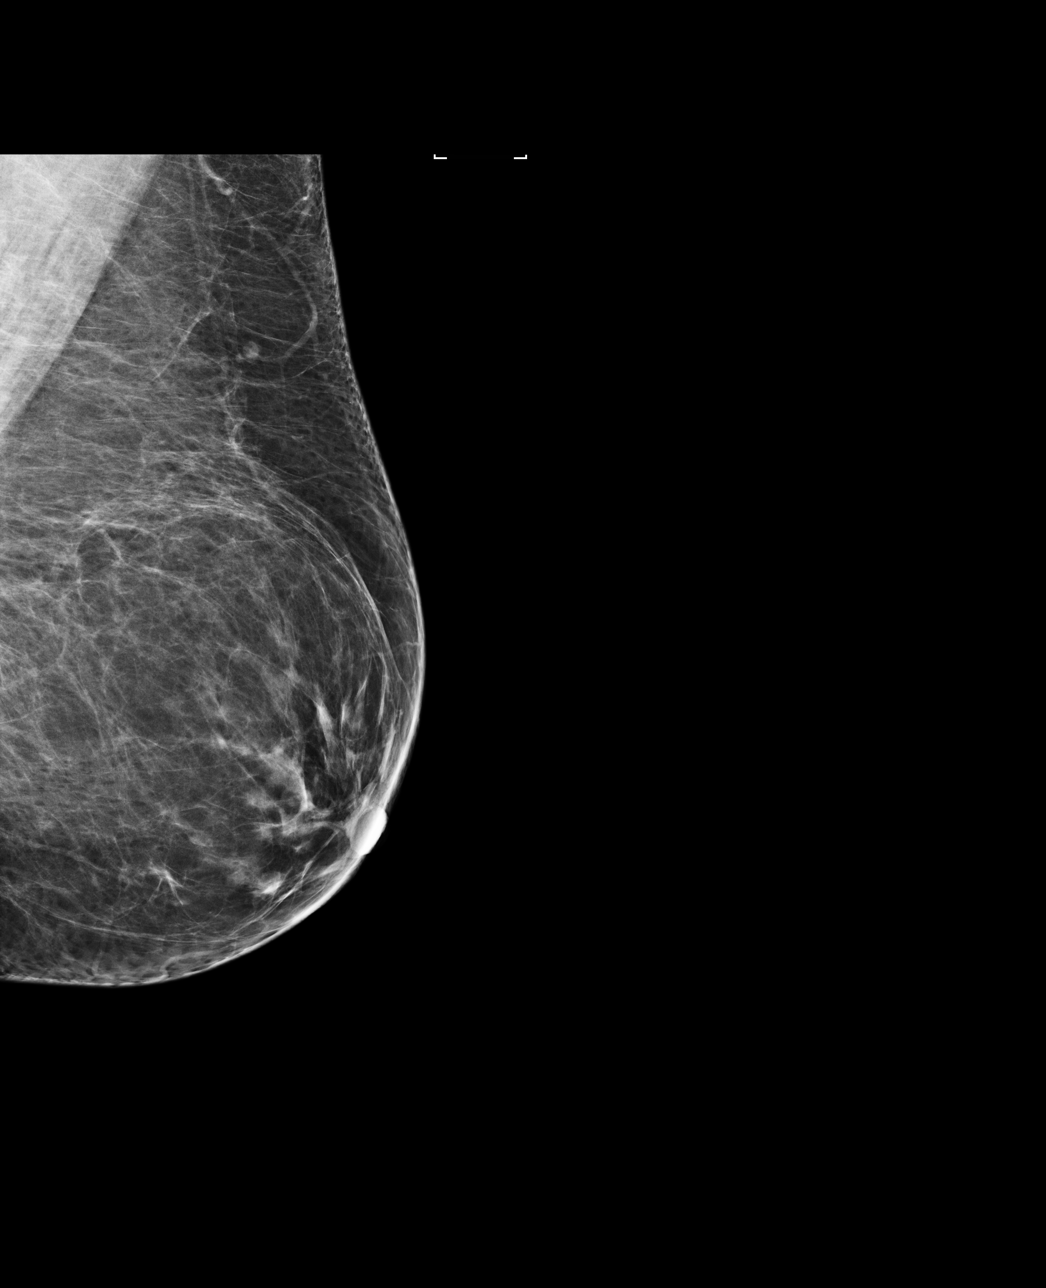

[R MLO]
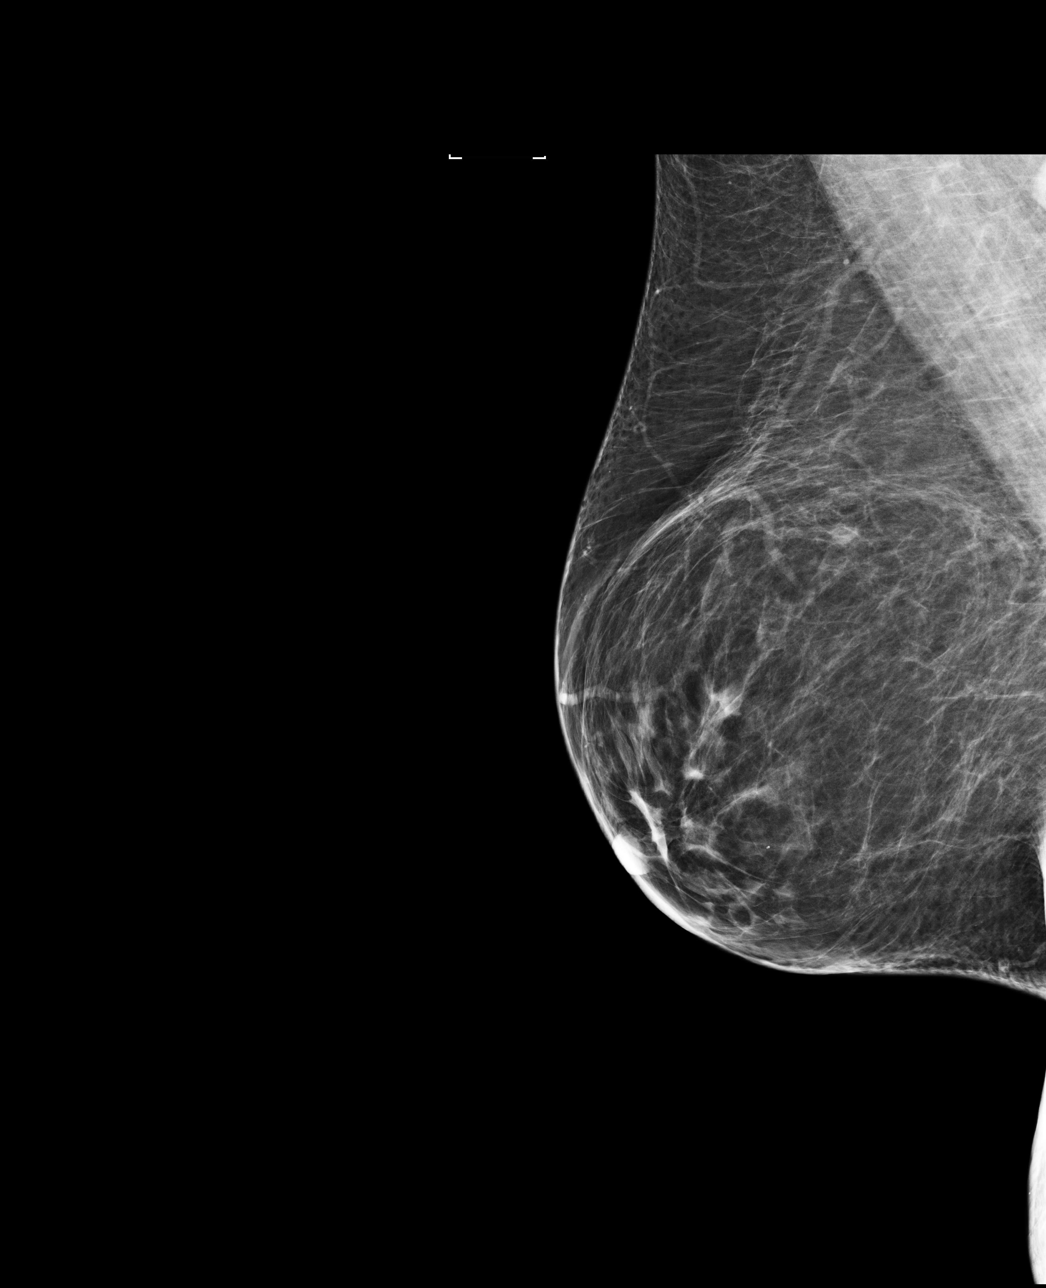

[L CC]
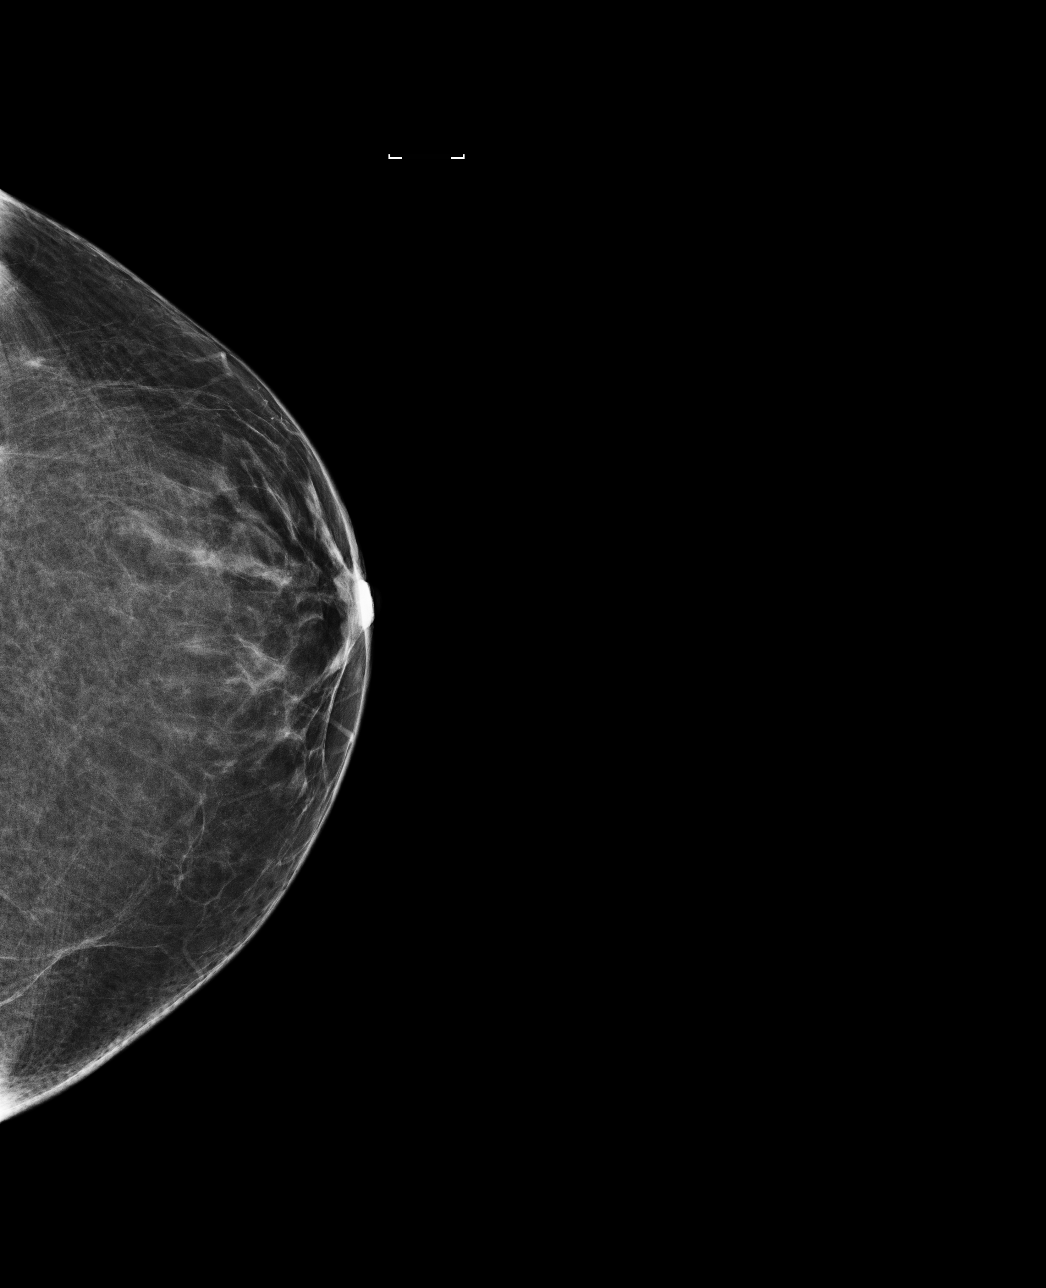

[4 of 4 positions shown; findings below may reference images not displayed]

ACR Breast Density Category b: There are scattered areas of
fibroglandular density.
FINDINGS: In the right breast, a possible asymmetry warrants further
evaluation. In the left breast, no findings suspicious for
malignancy.
IMPRESSION: Further evaluation is suggested for possible asymmetry in the right
breast.

RECOMMENDATION:
Diagnostic mammogram and possibly ultrasound of the right breast.
(Code:[id number]-[id number])

The patient will be contacted regarding the findings, and additional
imaging will be scheduled.

BI-RADS CATEGORY  0: Incomplete. Need additional imaging evaluation
and/or prior mammograms for comparison.

## 2022-03-03 MED ORDER — CLINDAMYCIN HCL 300 MG PO CAPS: 300.0000 mg | ORAL_CAPSULE | Freq: Three times a day (TID) | ORAL | 0 refills | Status: DC

## 2022-03-03 NOTE — Therapy (Signed)
OUTPATIENT PHYSICAL THERAPY LOWER EXTREMITY EVALUATION   Patient Name: [name] [name] MRN: [id number] DOB:[birth year], 56 y.o., female Today's Date: 03/05/2022   PT End of Session - 03/05/22 0748     Visit Number 1    Number of Visits 17    Date for PT Re-Evaluation 04/30/22    Authorization Type [employer] medicare/medicaid    Authorization Time Period no auth, FOTO v6 v10, kx 15    Progress Note Due on Visit 10    PT Start Time 0755    PT Stop Time 0837    PT Time Calculation (min) 42 min    Activity Tolerance Patient tolerated treatment well;No increased pain    Behavior During Therapy [facility] for tasks assessed/performed             Past Medical History:  Diagnosis Date   Anxiety    Arthritis    Phreesia 02/08/2020   Asthma    mild intermittent   Asthma    Phreesia 02/08/2020   Bipolar 1 disorder ([place])    ect treatments last treatment Sep 02 1011   Depression    Depression    Phreesia 02/08/2020   Depression    Phreesia 07/10/2020   GERD (gastroesophageal reflux disease)    Hypertension    Pre-diabetes    Seizures ([place])    last seizure was so long ago she can't remember.    Sleep apnea    wears CPAP, uncertain of setting   Substance abuse ([place])    Phreesia 02/08/2020   Past Surgical History:  Procedure Laterality Date   ABDOMINAL HYSTERECTOMY     AMPUTATION TOE Left 02/02/2018   Procedure: AMPUTATION TOE Left 4th toe;  Surgeon: [name], DPM;  Location: [place];  Service: Podiatry;  Laterality: Left;   APPENDECTOMY N/A    Phreesia 02/08/2020   BACK SURGERY     CARPAL TUNNEL RELEASE     x2   IRRIGATION AND DEBRIDEMENT ABSCESS Right 10/25/2020   Procedure: IRRIGATION AND DEBRIDEMENT ABSCESS OF FOOT AND APPLICATION OF GRAFT;  Surgeon: [name], DPM;  Location: [place];  Service: Podiatry;  Laterality: Right;   Laproscopic knee surgery     NECK SURGERY  03/15/2017   PLANTAR FASCIA RELEASE     x2   SPINE SURGERY N/A    Phreesia 02/08/2020    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   Procedure: TRANSMETATARSAL AMPUTATION;  Surgeon: [name], DPM;  Location: [facility];  Service: Podiatry;  Laterality: Right;   Patient Active Problem List   Diagnosis Date Noted   Diarrhea in adult patient    Weakness 06/30/2021   Gastroenteritis, infectious 06/30/2021   AKI (acute kidney injury) ([place]) 06/30/2021   Hyponatremia 06/30/2021   Open wound of right foot, initial encounter 10/21/2020   Finger ulcer ([place]) 10/21/2020   Syncope 08/26/2019   Non-healing open wound of toe 05/15/2019   Osteomyelitis ([place]) 05/15/2019   Palpitations 10/26/2018   Foot ulcer ([place]) 01/31/2018   Hyperglycemia 01/31/2018   Left leg cellulitis 09/24/2017   Syncope and collapse    Near syncope 11/23/2016   Abdominal pain 09/23/2016   Anxiety 09/23/2016   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[place]) 09/23/2016   Constipation 09/23/2016   DDD (degenerative disc disease), lumbosacral 09/23/2016   Migraines 09/23/2016   Myelopathy ([place]) 09/23/2016   Nausea 09/23/2016   Post laminectomy syndrome 09/23/2016   Pseudoarthrosis of lumbar spine 09/23/2016   Shortness of breath 09/23/2016   Sleep apnea 09/23/2016  Spinal stenosis of lumbar region 09/23/2016   Vertigo 09/23/2016   Hallux malleus 11/15/2015   Chronic pain syndrome    GERD (gastroesophageal reflux disease)    Depression    Rash 11/14/2015   Toe ulcer, right ([place]) 04/11/2015   Cellulitis of toe of right foot 04/11/2015   Nonspecific chest pain    Chest pain 10/26/2014   Obesity (BMI 30-39.9) 10/26/2014   Hypokalemia 10/26/2014   Manic bipolar I disorder ([place]) 10/26/2014   Atypical angina 10/26/2014   Tardive dyskinesia 08/26/2011   Manic bipolar I disorder with rapid cycling ([place]) 05/18/2011    Class: Acute   Asthma 02/24/2011   Essential hypertension 02/24/2011   History of migraine headaches 02/24/2011    PCP: [name], MD  REFERRING PROVIDER: Harrel Carina,  MD  REFERRING DIAG: primary OA of left hip  THERAPY DIAG:  Pain in left hip  Stiffness of left hip, not elsewhere classified  Muscle weakness (generalized)  Other abnormalities of gait and mobility  Rationale for Evaluation and Treatment: Rehabilitation  ONSET DATE: several years  SUBJECTIVE:   SUBJECTIVE STATEMENT: Pt reports having "a lot of arthritis" and a labral tear in L hip per recent MRI. Worsening pain over last six months but has had pain for several years, states exacerbation was preceded by a fall about the same time frame. Pt states she has a "terrible balance problem" that has been going on for a long time. Sleeping in recliner due to difficulty lying flat. Is able to do most of necessary tasks but requires increased time and rest breaks due to pain, shares household duties with her mother. Reports chronic numbness and tingling in legs that she attributes to back history, states MD is aware.   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 Hx multiple lumbar fusions, most recently about a month ago, ulcer R foot with current CAM boot PAIN:  Are you having pain: 0/10 Location: L hip  How would you describe your pain? sharp Best in past week: 0/10 Worst in past week: 8/10 (eases in about 22mn or so) Aggravating factors: bending, walking, lying flat, stairs Easing factors: rest, ice, medication (prescription)  PRECAUTIONS: fall risk, lumbar fusion, CAM boot RLE, cardiac hx per chart (although pt denies any current issues)  WEIGHT BEARING RESTRICTIONS: CAM boot RLE for ulcer  FALLS:  Has patient fallen in last 6 months? 2 falls; one tripped over cane, one lost balance; denies any dizziness or syncope w/ falls LIVING ENVIRONMENT: 1 level home, no STE, ramped entrance. Lives with mother who is independent.   SPC, rolling walker  OCCUPATION: not  working currently  PLOF: Independent  PATIENT GOALS: be able to walk more   OBJECTIVE:   DIAGNOSTIC FINDINGS: no hip imaging in chart, pt states she had L hip MRI that showed arthritis and labral tear  PATIENT SURVEYS:  FOTO 40%  COGNITION: Overall cognitive status: Within functional limits for tasks assessed     SENSATION: Chronic numbness/tingling in legs, light touch appears grossly intact on exam although not formally assessed given CAM boot  POSTURE: forward head, rounded shoulders, trunk lean to R   LOWER EXTREMITY ROM:  Active  Right eval Left eval  Hip flexion    Hip extension    Hip internal rotation (seated) 22 16p!  Hip external rotation (seated) 38 28 mild pain   (Blank rows = not tested)  Comments:  concordant pain w/ IR on L. PROM grossly comparable to AROM, limited by pain/stiffness  LOWER EXTREMITY MMT:    MMT Right eval Left eval  Hip flexion 4 3+ p!  Hip abduction (modified sitting) 4+ 4+  Hip internal rotation    Hip external rotation    Knee flexion 4+ 4- p!   Knee extension 4- 4- hip pain   (Blank rows = not tested)  Comments:   FUNCTIONAL TESTS:  5xSTS: 12sec w/ UE support, increasing pain with repetition, standard chair, reduced velocity with repetition  GAIT: Distance walked: within clinic Assistive device utilized: Quad cane small base, CAM boot RLE Level of assistance: Modified independence Comments: reduced gait speed/cadence, increased lateral weight shifting, reduced step length on R   TODAY'S TREATMENT:                                                                                                                               [facility] Adult PT Treatment:                                                DATE: 03/05/22 Therapeutic Exercise: Seated march x10 LLE, cues for HEP and form Adductor isos x10, cues for HEP setup and form   PATIENT EDUCATION:  Education details: Pt education on PT impairments, prognosis, and POC.  Informed consent. Rationale for interventions, safe/appropriate HEP performance Person educated: Patient Education method: Explanation, Demonstration, Tactile cues, Verbal cues, and Handouts Education comprehension: verbalized understanding, returned demonstration, verbal cues required, tactile cues required, and needs further education    HOME EXERCISE PROGRAM: Access Code: [email or web address] URL: [email or web address] Date: 03/05/2022 Prepared by: Enis Slipper  Exercises - Seated March  - 1 x daily - 7 x weekly - 3 sets - 10 reps - Seated Hip Adduction Isometrics with Ball  - 1 x daily - 7 x weekly - 3 sets - 10 reps  ASSESSMENT:  CLINICAL IMPRESSION: Pt is a 56 y.o woman who arrives to PT session on this date for evaluation of L hip pain. Pt has multiple medical comorbidities as outlined above and hx of MSK issues at multiple sites. Pt reports increased pain and difficulty with majority of daily activities and mobility due to pain, requires frequent rest breaks, unable to do heavy household activities that require bending/lifting. On examination today pt demonstrates gait impairments as noted above, reduced B LE mobility and strength. Pt tolerates HEP well without increase in resting pain, demonstrates appropriate performance. No adverse events. Pt departs today's session in no acute distress, all voiced questions/concerns addressed appropriately from PT perspective.  Recommend skilled PT to address aforementioned deficits and maximize functional independence, minimize fall risk  OBJECTIVE IMPAIRMENTS: Abnormal gait, decreased activity tolerance, decreased balance, decreased endurance, decreased mobility, difficulty walking, decreased ROM, decreased strength, and pain.  ACTIVITY LIMITATIONS: carrying, lifting, bending, standing, squatting, stairs, transfers, and locomotion level  PARTICIPATION LIMITATIONS: cleaning, laundry, and community activity  PERSONAL FACTORS: Time since  onset of injury/illness/exacerbation and 3+ comorbidities: atypical angina, HTN, myelopathy, bipolar disorder, chronic pain syndrome, 2021 R TMA, L digit 4 amputation 2019  are also affecting patient's functional outcome.   REHAB POTENTIAL: Fair given chronicity and comorbidities  CLINICAL DECISION MAKING: Evolving/moderate complexity  EVALUATION COMPLEXITY: Moderate   GOALS: Goals reviewed with patient? Yes  SHORT TERM GOALS: Target date: 04/02/2022  Pt will demonstrate appropriate understanding and performance of initially prescribed HEP in order to facilitate improved independence with management of symptoms.  Baseline: HEP provided on eval Goal status: INITIAL   2. Pt will score greater than or equal to 46% on FOTO in order to demonstrate improved perception of function due to symptoms.  Baseline: 40%  Goal status: INITIAL   LONG TERM GOALS: Target date: 04/30/2022   Pt will score 52% or greater on FOTO in order to demonstrate improved perception of function due to symptoms.  Baseline: 40% Goal status: INITIAL  2.  Pt will demonstrate at least 20 degrees of L hip IR AROM in order to facilitate improved tolerance/mechanics with functional mobility.  Baseline: see ROM chart above Goal status: INITIAL  3.  Pt will demonstrate grossly symmetrical hip MMT to facilitate improved functional strength.   Baseline: see MMT chart above Goal status: INITIAL  4.  Pt will be able to perform 5xSTS in less than or equal to 10 sec w/o UE support in order to demonstrate reduced fall risk and improved functional independence (MCID 5xSTS = 2.3 sec). Baseline: 12sec heavy UE use standard chair Goal status: INITIAL  5. Pt will report ability to walk/stand up to 30 min with less than 4/10 pain on NPS in order to facilitate improved tolerance to household tasks.   Baseline: up to 8/10 with household tasks  Goal status: INITIAL  PLAN:  PT FREQUENCY: 1-2x/week  PT DURATION: 8 weeks  PLANNED  INTERVENTIONS: Therapeutic exercises, Therapeutic activity, Neuromuscular re-education, Balance training, Gait training, Patient/Family education, Self Care, Joint mobilization, Stair training, Aquatic Therapy, Cryotherapy, Moist heat, Taping, Manual therapy, and Re-evaluation  PLAN FOR NEXT SESSION: Progress ROM/strengthening exercises as able/appropriate, review HEP.    Leeroy Cha PT, DPT 03/05/2022 8:57 AM

## 2022-03-03 NOTE — Assessment & Plan Note (Signed)
History of atypical chest pain in the past.  I had ordered a coronary calcium score which was never performed.  We will reorder.

## 2022-03-03 NOTE — Progress Notes (Signed)
03/03/2022 [name] [name]   02-Aug-1965  [id number]  Primary Physician Pickard, Cammie Mcgee, MD Primary Cardiologist: [name] MD Lupe Carney, [place]  HPI:  [name] [name] is a 56 y.o.  moderately overweight separated Caucasian female mother of 3 children, grandmother of 2 grandchildren who currently does not work. Her primary care provider is Dr. Dennard Schaumann .  I last saw her in the office 01/28/2021.  She was initially referred by the emergency room because of chest pain. Risk factors include treated hypertension and family history. She's never had a heart attack or stroke. She's had chest pain off and on for years more frequent recently. In the ER her EKG showed no acute changes. Enzymes are negative. The pain is brought on by tachypalpitations resulting in chest pain radiating to her jaw with presyncope. A 2-D echocardiogram which was normal and event monitor that showed normal LV function. When I saw her last in the office she had had a syncopal episode in the waiting room and was transported to [facility] where she was observed for 48 hours and discharged. Telemetry was unrevealing. She was begun on low-dose metoprolol she says has improved many of her symptoms.     Since I saw her a year ago she has done well.  She no longer has chest pain or palpitations.  She does get occasional shortness of breath but I suspect this is multifactorial.  She apparently needs a shoulder replacement, hip replacement and bilateral knee replacements.  Based on her normal echo and prior normal Myoview with lack of symptoms, she is cleared for upcoming orthopedic surgery.   Current Meds  Medication Sig   albuterol (PROAIR HFA) 108 (90 Base) MCG/ACT inhaler Inhale 2 puffs = 122mg into the lungs every 6 (six) hours as needed for wheezing or shortness of breath.   busPIRone (BUSPAR) 15 MG tablet Take 15 mg by mouth 3 (three) times daily.    Calcium Carbonate-Vit D-Min (CALCIUM 1200 PO) Take  1,200 mg by mouth daily.    clindamycin (CLEOCIN) 300 MG capsule Take 1 capsule (300 mg total) by mouth 3 (three) times daily.   cyclobenzaprine (FLEXERIL) 10 MG tablet Take 10 mg by mouth 2 (two) times daily as needed for muscle spasms.    DULoxetine (CYMBALTA) 60 MG capsule TAKE 1 CAPSULE BY MOUTH EVERY DAY   Erenumab-aooe (AIMOVIG) 70 MG/ML SOAJ administer 171mUNDER THE SKIN every 30 DAYS   escitalopram (LEXAPRO) 10 MG tablet Take 10 mg by mouth daily.   estradiol (ESTRACE) 1 MG tablet Take 1 tablet (1 mg total) by mouth at bedtime.   famotidine (PEPCID) 40 MG tablet Take 1 tablet (40 mg total) by mouth at bedtime.   fluticasone (FLONASE) 50 MCG/ACT nasal spray SPRAY 2 SPRAYS INTRO EACH NOSTRIL EVERY DAY (Patient taking differently: Place 2 sprays into both nostrils daily as needed for allergies.)   haloperidol (HALDOL) 5 MG tablet Take 1 tablet (5 mg total) by mouth at bedtime. For psychosis   lamoTRIgine (LAMICTAL) 25 MG tablet Take by mouth.   levocetirizine (XYZAL) 5 MG tablet Take 1 tablet (5 mg total) by mouth every evening.   loperamide (IMODIUM) 2 MG capsule Take 2 mg by mouth 4 (four) times daily as needed.   meloxicam (MOBIC) 15 MG tablet Take 15 mg by mouth at bedtime.    metoprolol succinate (TOPROL-XL) 25 MG 24 hr tablet Take 1 tablet (25 mg total) by mouth daily.   Multiple  Vitamins-Minerals (MULTIVITAMIN WITH MINERALS) tablet Take 1 tablet by mouth daily.   naloxone (NARCAN) nasal spray 4 mg/0.1 mL    ondansetron (ZOFRAN) 4 MG tablet Take 1 tablet (4 mg total) by mouth every 8 (eight) hours as needed for nausea or vomiting.   OVER THE COUNTER MEDICATION Take 1 capsule by mouth daily. Fennel Seed   oxyCODONE-acetaminophen (PERCOCET) 7.5-325 MG tablet Take 1 tablet by mouth 6 (six) times daily.   pantoprazole (PROTONIX) 40 MG tablet Take 1 tablet (40 mg total) by mouth 2 (two) times daily.   prazosin (MINIPRESS) 2 MG capsule Take 2 mg by mouth at bedtime.   pregabalin (LYRICA)  100 MG capsule Take 1 capsule (100 mg total) by mouth 2 (two) times daily.   promethazine (PHENERGAN) 25 MG tablet Take 1 tablet (25 mg total) by mouth every 8 (eight) hours as needed for nausea or vomiting.   solifenacin (VESICARE) 10 MG tablet Take 1 tablet (10 mg total) by mouth daily.   spironolactone (ALDACTONE) 50 MG tablet Take 1 tablet (50 mg total) by mouth daily.   SUMAtriptan (IMITREX) 50 MG tablet TAKE 1 TAB EVERY 2 HOURS AS NEEDED FOR MIGRAINE. MAY REPEAT IN 2 HOURS IF PERSISTS OR RECURS (Patient taking differently: Take 50 mg by mouth every 2 (two) hours as needed for migraine.)   topiramate (TOPAMAX) 50 MG tablet Take 1 tablet (50 mg total) by mouth 2 (two) times daily.   traZODone (DESYREL) 100 MG tablet Take 200 mg by mouth at bedtime.     Allergies  Allergen Reactions   Other    Penicillins Hives and Rash    No anaphylactic or severe cutaneous Sx > 10 yr ago, no additional medical attention required Tolerates Keflex and Rocephin   Tetracyclines & Related Other (See Comments)    Syncope and put her "in a coma"   Tramadol Other (See Comments)    Seizures   Phenazopyridine Other (See Comments)    Unknown   Tetracycline Other (See Comments)    Syncope and "Put me in a coma"   Ciprofloxacin Rash and Itching   Codeine Itching and Rash   Estradiol Rash    Patches broke out the skin    Social History   Socioeconomic History   Marital status: Widowed    Spouse name: Not on file   Number of children: 3   Years of education: 79   Highest education level: Not on file  Occupational History   Occupation: Disabled  Tobacco Use   Smoking status: Never   Smokeless tobacco: Never  Vaping Use   Vaping Use: Never used  Substance and Sexual Activity   Alcohol use: No   Drug use: No   Sexual activity: Not on file  Other Topics Concern   Not on file  Social History Narrative   Patient drink 4 16oz caffeine drinks a day    Social Determinants of Health   Financial  Resource Strain: Low Risk  (08/21/2020)   Overall Financial Resource Strain (CARDIA)    Difficulty of Paying Living Expenses: Not hard at all  Food Insecurity: No Food Insecurity (08/21/2020)   Hunger Vital Sign    Worried About Running Out of Food in the Last Year: Never true    [place] in the Last Year: Never true  Transportation Needs: No Transportation Needs (08/21/2020)   PRAPARE - Hydrologist (Medical): No    Lack of Transportation (Non-Medical): No  Physical Activity: Inactive (08/21/2020)   Exercise Vital Sign    Days of Exercise per Week: 0 days    Minutes of Exercise per Session: 0 min  Stress: No Stress Concern Present (08/21/2020)   [place]    Feeling of Stress : Not at all  Social Connections: Socially Isolated (08/21/2020)   Social Connection and Isolation Panel [NHANES]    Frequency of Communication with Friends and Family: More than three times a week    Frequency of Social Gatherings with Friends and Family: Twice a week    Attends Religious Services: Never    Marine scientist or Organizations: No    Attends Archivist Meetings: Never    Marital Status: Widowed  Intimate Partner Violence: Not At Risk (08/21/2020)   Humiliation, Afraid, Rape, and Kick questionnaire    Fear of Current or Ex-Partner: No    Emotionally Abused: No    Physically Abused: No    Sexually Abused: No     Review of Systems: General: negative for chills, fever, night sweats or weight changes.  Cardiovascular: negative for chest pain, dyspnea on exertion, edema, orthopnea, palpitations, paroxysmal nocturnal dyspnea or shortness of breath Dermatological: negative for rash Respiratory: negative for cough or wheezing Urologic: negative for hematuria Abdominal: negative for nausea, vomiting, diarrhea, bright red blood per rectum, melena, or hematemesis Neurologic: negative for visual  changes, syncope, or dizziness All other systems reviewed and are otherwise negative except as noted above.    Blood pressure 124/72, pulse 89, height '5\' 4"'$  (1.626 m), weight 238 lb (108 kg), SpO2 98 %.  General appearance: alert and no distress Neck: no adenopathy, no carotid bruit, no JVD, supple, symmetrical, trachea midline, and thyroid not enlarged, symmetric, no tenderness/mass/nodules Lungs: clear to auscultation bilaterally Heart: regular rate and rhythm, S1, S2 normal, no murmur, click, rub or gallop Extremities: extremities normal, atraumatic, no cyanosis or edema Pulses: 2+ and symmetric Skin: Skin color, texture, turgor normal. No rashes or lesions Neurologic: Grossly normal  EKG sinus rhythm at 89 without ST or T wave changes.  Personally reviewed this EKG.  ASSESSMENT AND PLAN:   Atypical angina History of atypical chest pain in the past.  I had ordered a coronary calcium score which was never performed.  We will reorder.     [name] MD FACP,FACC,FAHA, [facility] [place] 03/03/2022 1:42 PM

## 2022-03-03 NOTE — Assessment & Plan Note (Signed)
History of palpitations in the past.  We had ordered an event monitor which was never performed.  She no longer has palpitations.

## 2022-03-03 NOTE — Patient Instructions (Signed)
Medication Instructions:  Your physician recommends that you continue on your current medications as directed. Please refer to the Current Medication list given to you today.  *If you need a refill on your cardiac medications before your next appointment, please call your pharmacy*   Testing/Procedures: Dr. [name] has ordered a CT coronary calcium score.   Test locations:   [facility] ([address] [place], [place] [address])   This is $99 out of pocket.   Coronary CalciumScan A coronary calcium scan is an imaging test used to look for deposits of calcium and other fatty materials (plaques) in the inner lining of the blood vessels of the heart (coronary arteries). These deposits of calcium and plaques can partly clog and narrow the coronary arteries without producing any symptoms or warning signs. This puts a person at risk for a heart attack. This test can detect these deposits before symptoms develop. Tell a health care provider about: Any allergies you have. All medicines you are taking, including vitamins, herbs, eye drops, creams, and over-the-counter medicines. Any problems you or family members have had with anesthetic medicines. Any blood disorders you have. Any surgeries you have had. Any medical conditions you have. Whether you are pregnant or may be pregnant. What are the risks? Generally, this is a safe procedure. However, problems may occur, including: Harm to a pregnant woman and her unborn baby. This test involves the use of radiation. Radiation exposure can be dangerous to a pregnant woman and her unborn baby. If you are pregnant, you generally should not have this procedure done. Slight increase in the risk of cancer. This is because of the radiation involved in the test. What happens before the procedure? No preparation is needed for this procedure. What happens during the procedure? You will undress and remove any jewelry around your neck or  chest. You will put on a hospital gown. Sticky electrodes will be placed on your chest. The electrodes will be connected to an electrocardiogram (ECG) machine to record a tracing of the electrical activity of your heart. A CT scanner will take pictures of your heart. During this time, you will be asked to lie still and hold your breath for 2-3 seconds while a picture of your heart is being taken. The procedure may vary among health care providers and hospitals. What happens after the procedure? You can get dressed. You can return to your normal activities. It is up to you to get the results of your test. Ask your health care provider, or the department that is doing the test, when your results will be ready. Summary A coronary calcium scan is an imaging test used to look for deposits of calcium and other fatty materials (plaques) in the inner lining of the blood vessels of the heart (coronary arteries). Generally, this is a safe procedure. Tell your health care provider if you are pregnant or may be pregnant. No preparation is needed for this procedure. A CT scanner will take pictures of your heart. You can return to your normal activities after the scan is done. This information is not intended to replace advice given to you by your health care provider. Make sure you discuss any questions you have with your health care provider. Document Released: 10/02/2007 Document Revised: 02/23/2016 Document Reviewed: 02/23/2016 Elsevier Interactive Patient Education  2017 [place]: At [facility], you and your health needs are our priority.  As part of our continuing mission to provide you with  exceptional heart care, we have created designated Provider Care Teams.  These Care Teams include your primary Cardiologist (physician) and Advanced Practice Providers (APPs -  Physician Assistants and Nurse Practitioners) who all work together to provide you with the care you need,  when you need it.  We recommend signing up for the patient portal called "MyChart".  Sign up information is provided on this After Visit Summary.  MyChart is used to connect with patients for Virtual Visits (Telemedicine).  Patients are able to view lab/test results, encounter notes, upcoming appointments, etc.  Non-urgent messages can be sent to your provider as well.   To learn more about what you can do with MyChart, go to [email or web address].    Your next appointment:   12 month(s)  The format for your next appointment:   In Person  Provider:   Quay Burow, MD

## 2022-03-04 NOTE — Progress Notes (Signed)
Subjective: 56 year old female presents to the office today for follow-up evaluation after undergoing left partial nail avulsion given the infection.  States that it is still red and having some clear drainage but no pus.  No red streaks.  She also has new concerns of a wound that is formed on her right foot since I saw her last.  She thinks that the brace may be rubbing.  Denies any drainage or pus.  She has no fevers or chills.  Has no other concerns.   Objective: AAO x3, NAD DP/PT pulses palpable bilaterally, CRT less than 3 seconds Status post partial nail avulsion left medial hallux but there is still localized edema and erythema around the nail but no ascending cellulitis.  There is no frank purulence noted.  There is no fluctuation or crepitation.  There is no malodor. The right foot along the fifth metatarsal distally superficial wound measuring 0.8 x 0.4 cm.  There is no surrounding erythema, ascending cellulitis.  No fluctuance or crepitation but there is no malodor. No pain with calf compression, swelling, warmth, erythema  Assessment: Left medial ingrown, infection; right foot ulceration  Plan: -All treatment options discussed with the patient including all alternatives, risks, complications.  -Regards to the left foot I would like for her to continue antibiotics.  Continue wash with soap and water, dry thoroughly apply a similar bandage.  Discussed Epsom salts soaks as well.  Significant little bit better but if is not improving she still may need to have the entire toenail removed.  Switch to clindamycin -For the right foot recommended daily dressing changes with Silvadene.  Offloading.  Regular back in the cam boot on the right side which I dispensed to help offload the facilitate soft tissue healing.  She has an appointment to have the brace modified.  We had to do a new boot today as hers was completely worn out from previous.

## 2022-03-05 ENCOUNTER — Other Ambulatory Visit: Payer: Self-pay

## 2022-03-05 ENCOUNTER — Ambulatory Visit
Payer: Medicare Other | Attending: Student in an Organized Health Care Education/Training Program | Admitting: Physical Therapy

## 2022-03-05 ENCOUNTER — Encounter: Payer: Self-pay | Admitting: Physical Therapy

## 2022-03-05 DIAGNOSIS — M25652 Stiffness of left hip, not elsewhere classified: Secondary | ICD-10-CM | POA: Diagnosis not present

## 2022-03-05 DIAGNOSIS — M6281 Muscle weakness (generalized): Secondary | ICD-10-CM | POA: Diagnosis not present

## 2022-03-05 DIAGNOSIS — R2689 Other abnormalities of gait and mobility: Secondary | ICD-10-CM

## 2022-03-05 DIAGNOSIS — M25552 Pain in left hip: Secondary | ICD-10-CM | POA: Diagnosis not present

## 2022-03-08 ENCOUNTER — Ambulatory Visit (INDEPENDENT_AMBULATORY_CARE_PROVIDER_SITE_OTHER): Payer: Medicare Other | Admitting: Podiatry

## 2022-03-08 DIAGNOSIS — L600 Ingrowing nail: Secondary | ICD-10-CM

## 2022-03-08 DIAGNOSIS — L97511 Non-pressure chronic ulcer of other part of right foot limited to breakdown of skin: Secondary | ICD-10-CM

## 2022-03-08 MED ORDER — SULFAMETHOXAZOLE-TRIMETHOPRIM 800-160 MG PO TABS: 1.0000 | ORAL_TABLET | Freq: Two times a day (BID) | ORAL | 0 refills | Status: DC

## 2022-03-15 ENCOUNTER — Other Ambulatory Visit: Payer: Self-pay

## 2022-03-15 ENCOUNTER — Emergency Department (HOSPITAL_COMMUNITY)
Admission: EM | Admit: 2022-03-15 | Discharge: 2022-03-16 | Disposition: A | Discharge status: Home / Self Care | Payer: Medicare Other | Attending: Emergency Medicine | Admitting: Emergency Medicine

## 2022-03-15 ENCOUNTER — Ambulatory Visit: Payer: Self-pay

## 2022-03-15 ENCOUNTER — Emergency Department (HOSPITAL_COMMUNITY): Payer: Medicare Other

## 2022-03-15 ENCOUNTER — Emergency Department (EMERGENCY_DEPARTMENT_HOSPITAL): Payer: Medicare Other

## 2022-03-15 DIAGNOSIS — Z1152 Encounter for screening for COVID-19: Secondary | ICD-10-CM | POA: Diagnosis not present

## 2022-03-15 DIAGNOSIS — M79604 Pain in right leg: Secondary | ICD-10-CM

## 2022-03-15 DIAGNOSIS — Z7951 Long term (current) use of inhaled steroids: Secondary | ICD-10-CM | POA: Diagnosis not present

## 2022-03-15 DIAGNOSIS — M7989 Other specified soft tissue disorders: Secondary | ICD-10-CM

## 2022-03-15 DIAGNOSIS — R700 Elevated erythrocyte sedimentation rate: Secondary | ICD-10-CM | POA: Diagnosis not present

## 2022-03-15 DIAGNOSIS — S91309A Unspecified open wound, unspecified foot, initial encounter: Secondary | ICD-10-CM

## 2022-03-15 DIAGNOSIS — R079 Chest pain, unspecified: Secondary | ICD-10-CM

## 2022-03-15 DIAGNOSIS — M7121 Synovial cyst of popliteal space [Baker], right knee: Secondary | ICD-10-CM | POA: Diagnosis not present

## 2022-03-15 DIAGNOSIS — L97511 Non-pressure chronic ulcer of other part of right foot limited to breakdown of skin: Secondary | ICD-10-CM | POA: Insufficient documentation

## 2022-03-15 DIAGNOSIS — I10 Essential (primary) hypertension: Secondary | ICD-10-CM | POA: Insufficient documentation

## 2022-03-15 DIAGNOSIS — R0789 Other chest pain: Secondary | ICD-10-CM | POA: Insufficient documentation

## 2022-03-15 DIAGNOSIS — Z79899 Other long term (current) drug therapy: Secondary | ICD-10-CM | POA: Insufficient documentation

## 2022-03-15 DIAGNOSIS — R7982 Elevated C-reactive protein (CRP): Secondary | ICD-10-CM | POA: Insufficient documentation

## 2022-03-15 DIAGNOSIS — R0602 Shortness of breath: Secondary | ICD-10-CM | POA: Diagnosis not present

## 2022-03-15 DIAGNOSIS — L97519 Non-pressure chronic ulcer of other part of right foot with unspecified severity: Secondary | ICD-10-CM | POA: Diagnosis not present

## 2022-03-15 DIAGNOSIS — J45909 Unspecified asthma, uncomplicated: Secondary | ICD-10-CM | POA: Diagnosis not present

## 2022-03-15 LAB — COMPREHENSIVE METABOLIC PANEL
ALT: 15 U/L (ref 0–44)
AST: 16 U/L (ref 15–41)
Albumin: 3.5 g/dL (ref 3.5–5.0)
Alkaline Phosphatase: 80 U/L (ref 38–126)
Anion gap: 13 (ref 5–15)
BUN: 8 mg/dL (ref 6–20)
CO2: 22 mmol/L (ref 22–32)
Calcium: 9.2 mg/dL (ref 8.9–10.3)
Chloride: 98 mmol/L (ref 98–111)
Creatinine, Ser: 0.67 mg/dL (ref 0.44–1.00)
GFR, Estimated: >60 mL/min (ref >60)
Glucose, Bld: 88 mg/dL (ref 70–99)
Potassium: 4.4 mmol/L (ref 3.5–5.1)
Sodium: 133 mmol/L — ABNORMAL LOW (ref 135–145)
Total Bilirubin: <0.1 mg/dL — ABNORMAL LOW (ref 0.3–1.2)
Total Protein: 6.9 g/dL (ref 6.5–8.1)

## 2022-03-15 LAB — I-STAT CHEM 8, ED
BUN: 9 mg/dL (ref 6–20)
Calcium, Ion: 1.11 mmol/L — ABNORMAL LOW (ref 1.15–1.40)
Chloride: 101 mmol/L (ref 98–111)
Creatinine, Ser: 0.7 mg/dL (ref 0.44–1.00)
Glucose, Bld: 84 mg/dL (ref 70–99)
HCT: 35 % — ABNORMAL LOW (ref 36.0–46.0)
Hemoglobin: 11.9 g/dL — ABNORMAL LOW (ref 12.0–15.0)
Potassium: 4.4 mmol/L (ref 3.5–5.1)
Sodium: 133 mmol/L — ABNORMAL LOW (ref 135–145)
TCO2: 22 mmol/L (ref 22–32)

## 2022-03-15 LAB — CBC WITH DIFFERENTIAL/PLATELET
Abs Immature Granulocytes: 0.03 10*3/uL (ref 0.00–0.07)
Basophils Absolute: 0 10*3/uL (ref 0.0–0.1)
Basophils Relative: 0 %
Eosinophils Absolute: 0.2 10*3/uL (ref 0.0–0.5)
Eosinophils Relative: 3 %
HCT: 35.2 % — ABNORMAL LOW (ref 36.0–46.0)
Hemoglobin: 11.7 g/dL — ABNORMAL LOW (ref 12.0–15.0)
Immature Granulocytes: 1 %
Lymphocytes Relative: 24 %
Lymphs Abs: 1.3 10*3/uL (ref 0.7–4.0)
MCH: 33.3 pg (ref 26.0–34.0)
MCHC: 33.2 g/dL (ref 30.0–36.0)
MCV: 100.3 fL — ABNORMAL HIGH (ref 80.0–100.0)
Monocytes Absolute: 0.4 10*3/uL (ref 0.1–1.0)
Monocytes Relative: 7 %
Neutro Abs: 3.5 10*3/uL (ref 1.7–7.7)
Neutrophils Relative %: 65 %
Platelets: 254 10*3/uL (ref 150–400)
RBC: 3.51 MIL/uL — ABNORMAL LOW (ref 3.87–5.11)
RDW: 13.2 % (ref 11.5–15.5)
WBC: 5.4 10*3/uL (ref 4.0–10.5)
nRBC: 0 % (ref 0.0–0.2)

## 2022-03-15 LAB — TROPONIN I (HIGH SENSITIVITY)
Troponin I (High Sensitivity): <2 ng/L (ref <18)
Troponin I (High Sensitivity): <2 ng/L (ref <18)

## 2022-03-15 LAB — C-REACTIVE PROTEIN: CRP: 2.1 mg/dL — ABNORMAL HIGH (ref <1.0)

## 2022-03-15 LAB — SEDIMENTATION RATE: Sed Rate: 23 mm/hr — ABNORMAL HIGH (ref 0–22)

## 2022-03-15 NOTE — Progress Notes (Signed)
Subjective: Chief Complaint  Patient presents with   Foot Ulcer    Patient came in today for bilateral ulcer, left hallux, right tailor bunion ulcer,    56 year old female presents for above concerns.  Is doing better.  Is less swollen and red the area is starting to dry.  On the right mild bloody drainage at times.  No frank purulence.  Denies any fevers or chills.  No other concerns   Objective: AAO x3, NAD DP/PT pulses palpable bilaterally, CRT less than 3 seconds Status post partial nail avulsion left medial hallux.  The edema and erythema is much improved there is no drainage or pus.  The nail itself is loose.  No ascending cellulitis.  There is no fluctuance or crepitation. Wound still present measuring up the same in size of 0.8 x 0.4 cm granular base.  There is tenderness or erythema, ascending cellulitis.  No cultures or crepitation.  No malodor.  No pain with calf compression, swelling, warmth, erythema  Assessment: Left medial ingrown, infection; right foot ulceration  Plan: -All treatment options discussed with the patient including all alternatives, risks, complications.  -Left foot appears to be improving in the edema erythema as mentioned.  Discussed that nail may come off on its own or weight we may need to remove it at some point.  However for now we are going to continue with this, daily dressing changes. -On the right foot switch to Hydrofera Blue dressing changes daily.  If no improvement on the right foot will go back to the wound care center. -Monitor for any clinical signs or symptoms of infection and directed to call the office immediately should any occur or go to the ER.  Return in about 2 weeks (around 03/22/2022) for left toe ulcer, right foot ulcer.  [name] DPM

## 2022-03-15 NOTE — ED Provider Triage Note (Signed)
Emergency Medicine Provider Triage Evaluation Note  OLIVIAROSE PUNCH , a 56 y.o. female  was evaluated in triage.  Pt complains of right leg swelling and chest pain.  Has had ulcer of the foot that is being treated by podiatry.  She is currently on Bactrim.  She noted global leg swelling over the past 2 days and also has chest pain.  She has had shaking chills but denies fever nausea vomiting shortness of breath or near syncope.  Review of Systems  Positive: Leg pain Negative: Fever  Physical Exam  BP 108/78 (BP Location: Right Arm)   Pulse 71   Temp 98.3 F (36.8 C)   Resp 18   SpO2 100%  Gen:   Awake, no distress   Resp:  Normal effort  MSK:   Moves extremities without difficulty  Other:    Medical Decision Making  Medically screening exam initiated at 5:45 PM.  Appropriate orders placed.  GRISELLE RUFER was informed that the remainder of the evaluation will be completed by another provider, this initial triage assessment does not replace that evaluation, and the importance of remaining in the ED until their evaluation is complete.     Margarita Mail, PA-C 03/15/22 [phone]

## 2022-03-15 NOTE — ED Triage Notes (Signed)
Pt with right leg swelling.  Pt has boot on right foot for an "ulcer" that has not been healing.  Pt denies diabetes.  Pt states she had a blister there that became an ulcer.  Pt is currently on bactrim.

## 2022-03-15 NOTE — Progress Notes (Signed)
Lower extremity venous right study completed.  Preliminary results relayed to [name], [name] in triage.  See CV Proc for preliminary results report.   Darlin Coco, RDMS, RVT

## 2022-03-15 NOTE — Telephone Encounter (Signed)
  Chief Complaint:moderate "squeezing" chest pain to left side, right leg swollen severe) Symptoms: nausea, right leg larger than left Frequency: today Pertinent Negatives: Patient denies pain or redness to leg, sweating, radiating pain Disposition: '[x]'$ ED /'[]'$ Urgent Care (no appt availability in office) / '[]'$ Appointment(In office/virtual)/ '[]'$  [facility]/ '[]'$ Home Care/ '[]'$ Refused Recommended Disposition /'[]'$ [facility]/ '[]'$  Follow-up with PCP Additional Notes: pt verbalized understanding Reason for Disposition  [1] Chest pain lasts > 5 minutes AND [2] occurred in past 3 days (72 hours) (Exception: Feels exactly the same as previously diagnosed heartburn and has accompanying sour taste in mouth.)  Answer Assessment - Initial Assessment Questions 1. LOCATION: "Where does it hurt?"       Chest left side  2. RADIATION: "Does the pain go anywhere else?" (e.g., into neck, jaw, arms, back)     no 3. ONSET: "When did the chest pain begin?" (Minutes, hours or days)      Today  4. PATTERN: "Does the pain come and go, or has it been constant since it started?"  "Does it get worse with exertion?"      SOB at rest- comes and goes, yes  5. DURATION: "How long does it last" (e.g., seconds, minutes, hours)     N/a 6. SEVERITY: "How bad is the pain?"  (e.g., Scale 1-10; mild, moderate, or severe)    - MILD (1-3): doesn't interfere with normal activities     - MODERATE (4-7): interferes with normal activities or awakens from sleep    - SEVERE (8-10): excruciating pain, unable to do any normal activities       Squeeze 6/10 7. CARDIAC RISK FACTORS: "Do you have any history of heart problems or risk factors for heart disease?" (e.g., angina, prior heart attack; diabetes, high blood pressure, high cholesterol, smoker, or strong family history of heart disease)     *No Answer* 8. PULMONARY RISK FACTORS: "Do you have any history of lung disease?"  (e.g., blood clots in lung, asthma, emphysema,  birth control pills)     no 9. CAUSE: "What do you think is causing the chest pain?"     unsure 10. OTHER SYMPTOMS: "Do you have any other symptoms?" (e.g., dizziness, nausea, vomiting, sweating, fever, difficulty breathing, cough)       Nausea, right leg swelling whole leg, no pain, no redness.  11. PREGNANCY: "Is there any chance you are pregnant?" "When was your last menstrual period?"       N/a  Protocols used: Chest Pain-A-AH

## 2022-03-16 ENCOUNTER — Encounter (HOSPITAL_COMMUNITY): Payer: Self-pay

## 2022-03-16 DIAGNOSIS — L97511 Non-pressure chronic ulcer of other part of right foot limited to breakdown of skin: Secondary | ICD-10-CM | POA: Diagnosis not present

## 2022-03-16 LAB — RESP PANEL BY RT-PCR (FLU A&B, COVID) ARPGX2
Influenza A by PCR: NEGATIVE
Influenza B by PCR: NEGATIVE
SARS Coronavirus 2 by RT PCR: NEGATIVE

## 2022-03-16 LAB — D-DIMER, QUANTITATIVE: D-Dimer, Quant: 0.35 ug/mL-FEU (ref 0.00–0.50)

## 2022-03-16 MED ORDER — ACETAMINOPHEN 325 MG PO TABS
650.0000 mg | ORAL_TABLET | Freq: Once | ORAL | Status: AC
  Administered 2022-03-16: 650 mg via ORAL
  Filled 2022-03-16: qty 2

## 2022-03-16 NOTE — ED Provider Notes (Addendum)
[facility] EMERGENCY DEPARTMENT Provider Note   CSN: [id number] Arrival date & time: 03/15/22  1606     History  Chief Complaint  Patient presents with   Leg Pain    [name] [name] is a 56 y.o. female.   Leg Pain    56 year old female with medical history significant for HTN, asthma, OSA, anxiety, depression, bipolar disorder, GERD, prediabetes, ingrown toenails with subsequent foot wounds with surrounding cellulitis, currently managed outpatient on Bactrim who presents to the emergency department with multiple complaints.  The patient states that she has had right leg swelling and some crampy discomfort in the back of her leg.  She was concerned for blood clot in her legs.  She also has developed sharp and squeezing chest pain, nonradiating.  She has noted global leg swelling over the past 2 days.  She denies any new redness around her lower extremity wounds.  She is currently compliant with her outpatient Bactrim and has follow-up scheduled with podiatry. No fevers.  Home Medications Prior to Admission medications   Medication Sig Start Date End Date Taking? Authorizing Provider  albuterol (PROAIR HFA) 108 (90 Base) MCG/ACT inhaler Inhale 2 puffs = 122mg into the lungs every 6 (six) hours as needed for wheezing or shortness of breath. 07/11/20   MEulogio Bear NP  busPIRone (BUSPAR) 15 MG tablet Take 15 mg by mouth 3 (three) times daily.  04/26/17   [provider]  Calcium Carbonate-Vit D-Min (CALCIUM 1200 PO) Take 1,200 mg by mouth daily.     [provider]  clindamycin (CLEOCIN) 300 MG capsule Take 1 capsule (300 mg total) by mouth 3 (three) times daily. 03/03/22   WTrula Slade DPM  cyclobenzaprine (FLEXERIL) 10 MG tablet Take 10 mg by mouth 2 (two) times daily as needed for muscle spasms.     [provider]  DULoxetine (CYMBALTA) 60 MG capsule TAKE 1 CAPSULE BY MOUTH EVERY DAY 06/29/21   PSusy Frizzle MD   Erenumab-aooe (AIMOVIG) 70 MG/ML SOAJ administer 129mUNDER THE SKIN every 30 DAYS 02/08/22   PiSusy FrizzleMD  escitalopram (LEXAPRO) 10 MG tablet Take 10 mg by mouth daily. 08/23/19   [provider]  estradiol (ESTRACE) 1 MG tablet Take 1 tablet (1 mg total) by mouth at bedtime. 07/27/21   PiSusy FrizzleMD  famotidine (PEPCID) 40 MG tablet Take 1 tablet (40 mg total) by mouth at bedtime. 05/28/21   PiSusy FrizzleMD  fluticasone (FLONASE) 50 MCG/ACT nasal spray SPRAY 2 SPRAYS INTRO EACH NOSTRIL EVERY DAY Patient taking differently: Place 2 sprays into both nostrils daily as needed for allergies. 12/18/20   PiSusy FrizzleMD  haloperidol (HALDOL) 5 MG tablet Take 1 tablet (5 mg total) by mouth at bedtime. For psychosis 11/15/15   MaBarton DuboisMD  lamoTRIgine (LAMICTAL) 25 MG tablet Take by mouth. 08/06/21   [provider]  levocetirizine (XYZAL) 5 MG tablet Take 1 tablet (5 mg total) by mouth every evening. 05/13/21   PiSusy FrizzleMD  LINZESS 290 MCG CAPS capsule TAKE ONE CAPSULE BY MOUTH EVERY MORNING 10/28/21   PiSusy FrizzleMD  loperamide (IMODIUM) 2 MG capsule Take 2 mg by mouth 4 (four) times daily as needed. 06/30/21   [provider]  meloxicam (MOBIC) 15 MG tablet Take 15 mg by mouth at bedtime.  09/15/17   [provider]  metoprolol succinate (TOPROL-XL) 25 MG 24 hr tablet Take 1  tablet (25 mg total) by mouth daily. 10/23/21   [name], MD  MOVANTIK 25 MG TABS tablet 25 mg.    [provider]  Multiple Vitamins-Minerals (MULTIVITAMIN WITH MINERALS) tablet Take 1 tablet by mouth daily.    [provider]  naloxone [facility]) nasal spray 4 mg/0.1 mL  08/03/21   [provider]  ondansetron (ZOFRAN) 4 MG tablet Take 1 tablet (4 mg total) by mouth every 8 (eight) hours as needed for nausea or vomiting. 07/03/21   [name], MD  OVER THE COUNTER MEDICATION Take 1 capsule by mouth daily. Fennel Seed     [provider]  oxyCODONE-acetaminophen (PERCOCET) 7.5-325 MG tablet Take 1 tablet by mouth 6 (six) times daily.    [provider]  pantoprazole (PROTONIX) 40 MG tablet Take 1 tablet (40 mg total) by mouth 2 (two) times daily. 02/02/22   [name], MD  prazosin (MINIPRESS) 2 MG capsule Take 2 mg by mouth at bedtime.    [provider]  pregabalin (LYRICA) 100 MG capsule Take 1 capsule (100 mg total) by mouth 2 (two) times daily. 07/27/21   [name], MD  promethazine (PHENERGAN) 25 MG tablet Take 1 tablet (25 mg total) by mouth every 8 (eight) hours as needed for nausea or vomiting. 03/18/21   [name], DPM  solifenacin (VESICARE) 10 MG tablet Take 1 tablet (10 mg total) by mouth daily. 01/21/22   [name], MD  spironolactone (ALDACTONE) 50 MG tablet Take 1 tablet (50 mg total) by mouth daily. 02/03/22   [name], MD  sulfamethoxazole-trimethoprim (BACTRIM DS) 800-160 MG tablet Take 1 tablet by mouth 2 (two) times daily. 03/08/22   [name], DPM  SUMAtriptan (IMITREX) 50 MG tablet TAKE 1 TAB EVERY 2 HOURS AS NEEDED FOR MIGRAINE. MAY REPEAT IN 2 HOURS IF PERSISTS OR RECURS Patient taking differently: Take 50 mg by mouth every 2 (two) hours as needed for migraine. 08/30/19   [name], MD  topiramate (TOPAMAX) 50 MG tablet Take 1 tablet (50 mg total) by mouth 2 (two) times daily. 07/27/21   [name], MD  traZODone (DESYREL) 100 MG tablet Take 200 mg by mouth at bedtime. 07/29/14   [provider]      Allergies    Other, Penicillins, Tetracyclines & related, Tramadol, Phenazopyridine, Tetracycline, Ciprofloxacin, Codeine, and Estradiol    Review of Systems   Review of Systems  All other systems reviewed and are negative.   Physical Exam Updated Vital Signs BP 126/72   Pulse 91   Temp 98.7 F (37.1 C) (Oral)   Resp 15   SpO2 99%  Physical Exam Vitals and nursing note reviewed.   Constitutional:      General: She is not in acute distress.    Appearance: She is well-developed.  HENT:     Head: Normocephalic and atraumatic.  Eyes:     Conjunctiva/sclera: Conjunctivae normal.  Cardiovascular:     Rate and Rhythm: Normal rate and regular rhythm.  Pulmonary:     Effort: Pulmonary effort is normal. No respiratory distress.     Breath sounds: Normal breath sounds.  Chest:     Comments: Sided chest wall tenderness to palpation, no rash Abdominal:     Palpations: Abdomen is soft.     Tenderness: There is no abdominal tenderness.  Musculoskeletal:        General: No swelling.     Cervical back:  Neck supple.     Comments: Right foot ulcerative wound appears without purulence, healing, no surrounding erythema. No pitting edema bilaterally.  Skin:    General: Skin is warm and dry.     Capillary Refill: Capillary refill takes less than 2 seconds.  Neurological:     Mental Status: She is alert.  Psychiatric:        Mood and Affect: Mood normal.     ED Results / Procedures / Treatments   Labs (all labs ordered are listed, but only abnormal results are displayed) Labs Reviewed  CBC WITH DIFFERENTIAL/PLATELET - Abnormal; Notable for the following components:      Result Value   RBC 3.51 (*)    Hemoglobin 11.7 (*)    HCT 35.2 (*)    MCV 100.3 (*)    All other components within normal limits  COMPREHENSIVE METABOLIC PANEL - Abnormal; Notable for the following components:   Sodium 133 (*)    Total Bilirubin <0.1 (*)    All other components within normal limits  SEDIMENTATION RATE - Abnormal; Notable for the following components:   Sed Rate 23 (*)    All other components within normal limits  C-REACTIVE PROTEIN - Abnormal; Notable for the following components:   CRP 2.1 (*)    All other components within normal limits  I-STAT CHEM 8, ED - Abnormal; Notable for the following components:   Sodium 133 (*)    Calcium, Ion 1.11 (*)    Hemoglobin 11.9 (*)    HCT  35.0 (*)    All other components within normal limits  CULTURE, BLOOD (ROUTINE X 2)  RESP PANEL BY RT-PCR (FLU A&B, COVID) ARPGX2  CULTURE, BLOOD (ROUTINE X 2)  D-DIMER, QUANTITATIVE  TROPONIN I (HIGH SENSITIVITY)  TROPONIN I (HIGH SENSITIVITY)    EKG EKG Interpretation  Date/Time:  Tuesday March 16 2022 09:38:13 EST Ventricular Rate:  76 PR Interval:  204 QRS Duration: 104 QT Interval:  412 QTC Calculation: 464 R Axis:   46 Text Interpretation: Sinus rhythm Borderline prolonged PR interval Low voltage, precordial leads Confirmed by Regan Lemming ([id number]) on 03/16/2022 11:41:56 AM  Radiology VAS [place] LOWER EXTREMITY VENOUS (DVT)  Result Date: 03/15/2022  Lower Venous DVT Study Patient Name:  [name] [name]  Date of Exam:   03/15/2022 Medical Rec #: [id number]         Accession #:    [id number] Date of Birth: Sep 10, 1965         Patient Gender: F Patient Age:   78 years Exam Location:  [facility] Procedure:      VAS [place] LOWER EXTREMITY VENOUS (DVT) Referring Phys: ABIGAIL HARRIS --------------------------------------------------------------------------------  Indications: Right leg swelling, chest pain. Other Indications: Ulceration of right foot, on Bactrim, hx of toe amputations                    of right foot. Limitations: Poor ultrasound/tissue interface. Comparison Study: No prior venous studies. Prior arterial studies performed in                   2019 were within normal limits. Performing Technologist: Darlin Coco RDMS, RVT  Examination Guidelines: A complete evaluation includes B-mode imaging, spectral Doppler, color Doppler, and power Doppler as needed of all accessible portions of each vessel. Bilateral testing is considered an integral part of a complete examination. Limited examinations for reoccurring indications may be performed as noted. The reflux portion of the exam is performed with the  patient in reverse Trendelenburg.   +---------+---------------+---------+-----------+----------+--------------+ RIGHT    CompressibilityPhasicitySpontaneityPropertiesThrombus Aging +---------+---------------+---------+-----------+----------+--------------+ CFV      Full           Yes      Yes                                 +---------+---------------+---------+-----------+----------+--------------+ SFJ      Full                                                        +---------+---------------+---------+-----------+----------+--------------+ FV Prox  Full                                                        +---------+---------------+---------+-----------+----------+--------------+ FV Mid   Full                                                        +---------+---------------+---------+-----------+----------+--------------+ FV DistalFull                                                        +---------+---------------+---------+-----------+----------+--------------+ PFV      Full                                                        +---------+---------------+---------+-----------+----------+--------------+ POP      Full           Yes      Yes                                 +---------+---------------+---------+-----------+----------+--------------+ PTV      Full                                                        +---------+---------------+---------+-----------+----------+--------------+ PERO     Full                                                        +---------+---------------+---------+-----------+----------+--------------+   +----+---------------+---------+-----------+----------+--------------+ LEFTCompressibilityPhasicitySpontaneityPropertiesThrombus Aging +----+---------------+---------+-----------+----------+--------------+ CFV Full           Yes      Yes                                  +----+---------------+---------+-----------+----------+--------------+  Summary: RIGHT: - There is no evidence of deep vein thrombosis in the lower extremity.  - A cystic structure is found in the popliteal fossa. 4 cm x 0.9 cm.  LEFT: - No evidence of common femoral vein obstruction.  *See table(s) above for measurements and observations. Electronically signed by Harold Barban MD on 03/15/2022 at 9:02:02 PM.    Final    DG Foot Complete Right  Result Date: 03/15/2022 CLINICAL DATA:  Lateral foot ulcer and history of prior amputation, initial encounter EXAM: RIGHT FOOT COMPLETE - 3+ VIEW COMPARISON:  None Available. FINDINGS: Changes consistent with transmetatarsal amputation are noted. No bony erosive changes to suggest osteomyelitis are seen. Mild soft tissue irregularity consistent with the known history of soft tissue ulcer is noted. No other focal abnormality is seen. IMPRESSION: Surgical changes.  No evidence of osteomyelitis. Electronically Signed   By: Inez Catalina M.D.   On: 03/15/2022 19:45   DG Chest 2 View  Result Date: 03/15/2022 CLINICAL DATA:  Shortness of breath EXAM: CHEST - 2 VIEW COMPARISON:  08/25/2019 FINDINGS: Posterior thoracolumbar fusion. Anterior cervical fusion. No pulmonary edema or pleural fluid. No consolidation or pneumothorax. Normal cardiac silhouette. No acute osseous abnormality. IMPRESSION: No acute cardiopulmonary process. Electronically Signed   By: Suzy Bouchard M.D.   On: 03/15/2022 19:43   DG Foot Complete Left  Result Date: 03/15/2022 Please see detailed radiograph report in office note.   Procedures Procedures    Medications Ordered in ED Medications  acetaminophen (TYLENOL) tablet 650 mg (650 mg Oral Given 03/16/22 9242)    ED Course/ Medical Decision Making/ A&P                           Medical Decision Making Amount and/or Complexity of Data Reviewed Labs: ordered.  Risk OTC drugs.     56 year old female with medical history  significant for HTN, asthma, OSA, anxiety, depression, bipolar disorder, GERD, prediabetes, ingrown toenails with subsequent foot wounds with surrounding cellulitis, currently managed outpatient on Bactrim who presents to the emergency department with multiple complaints.  The patient states that she has had right leg swelling and some crampy discomfort in the back of her leg.  She was concerned for blood clot in her legs.  She also has developed sharp and squeezing chest pain, nonradiating.  She has noted global leg swelling over the past 2 days.  She denies any new redness around her lower extremity wounds.  She is currently compliant with her outpatient Bactrim and has follow-up scheduled with podiatry. No fevers.  On arrival, the patient was vitally stable, afebrile, not tachycardic or tachypneic, normotensive, saturating well on room air.  Differential diagnosis includes DVT of the right lower extremity, infection, PE, ACS, GERD, anxiety.  The patient has no clear lower extremity edema.  Her ulcerative wound in the right lower extremity appears to be well-healing.  DVT ultrasound was performed which revealed evidence of a popliteal fossa cyst consistent with likely Baker's cyst, no clear evidence of rupture, x-ray imaging of the right foot was performed and revealed no acute abnormality, postsurgical changes present from prior amputation.  Chest x-ray revealed no acute cardiac or pulmonary abnormality.  An EKG revealed sinus rhythm, ventricular rate 76, no acute ischemic changes.  Laboratory evaluation significant for CBC without a leukocytosis, WBC 5.4, hemoglobin mildly anemic to 11.7, CRP mildly elevated to 2.1, ESR mildly elevated to 23, nonspecific in the setting of the patient's chronic wounds.  A D-dimer was collected and resulted negative.  COVID-19 influenza PCR testing resulted negative.  A CMP was unremarkable.  Troponins x2 also resulted negative.  Patient does have reproducible left-sided chest  wall tenderness to palpation, considered costochondritis versus musculoskeletal pain.  No evidence of fracture on x-ray imaging.  Recommended NSAIDs for pain control.  Discussed the reassuring work-up with the patient.  She shared currently and shows no signs of lower extremity cellulitis in the setting of her wounds.  No evidence of DVT.  Baker's cyst noted on ultrasound imaging.  Laboratory evaluation reassuring at this time pointing away from PE or ACS, reassuring EKG and chest x-ray.  At this time, the patient is well-appearing, overall stable for discharge with continued outpatient management.  The patient has been appropriately medically screened and/or stabilized in the ED. I have low suspicion for any other emergent medical condition which would require further screening, evaluation or treatment in the ED or require inpatient management.   Final Clinical Impression(s) / ED Diagnoses Final diagnoses:  Right leg pain  Popliteal cyst, right  Wound of foot  Chest pain, unspecified type  Chest wall pain    Rx / DC Orders ED Discharge Orders     None         Regan Lemming, MD 03/16/22 1143    Regan Lemming, MD 03/16/22 1150

## 2022-03-16 NOTE — ED Notes (Signed)
Called lab to check on labs sent.

## 2022-03-16 NOTE — Discharge Instructions (Addendum)
Your ultrasound did not reveal evidence of a blood clot in your D-dimer testing was negative which makes the likelihood of a blood clot in your lungs very low.  Your cardiac enzyme testing was negative.  Your right lower extremity wound appears to be well-healing.  Continue Bactrim outpatient as previously prescribed and follow-up with podiatry as scheduled. Your cardiac enzymes and remainder of laboratory testing was reassuring.

## 2022-03-17 ENCOUNTER — Ambulatory Visit: Payer: Medicare Other | Admitting: Physical Therapy

## 2022-03-17 IMAGING — MG MM DIGITAL DIAGNOSTIC UNILAT*R* W/ TOMO W/ CAD
4 series · 4 of 12 positions shown · non-contrast
Comparison: Previous exam(s).

CLINICAL DATA: Possible asymmetry in the right breast on 2D
screening mammogram.

EXAM:
DIGITAL DIAGNOSTIC UNILATERAL RIGHT MAMMOGRAM WITH TOMOSYNTHESIS AND
CAD
TECHNIQUE: Right digital diagnostic mammography and breast tomosynthesis was
performed. The images were evaluated with computer-aided detection.

[R MLO synth-2D]
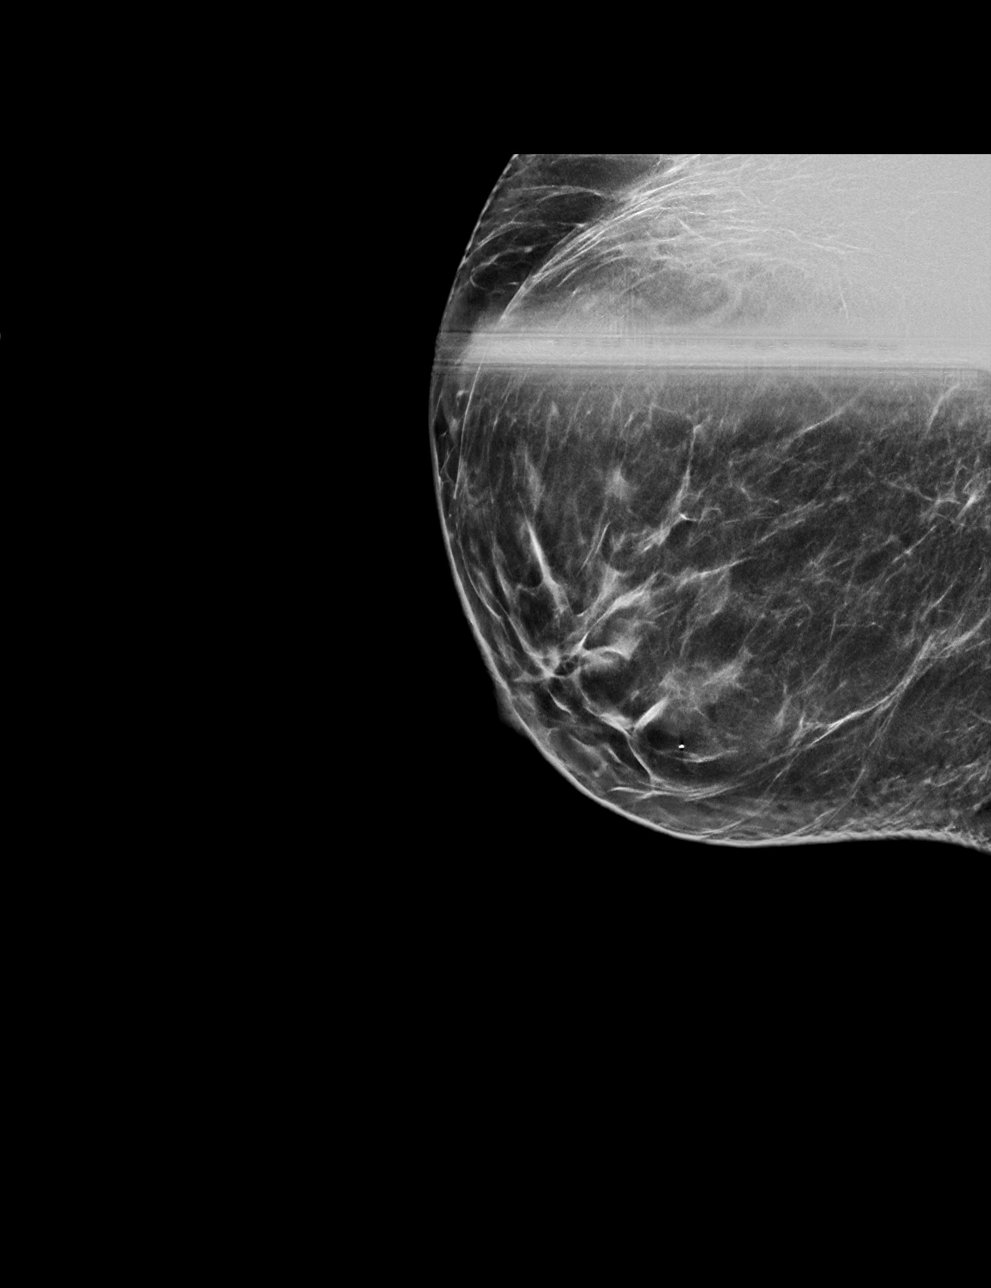

[R CC synth-2D]
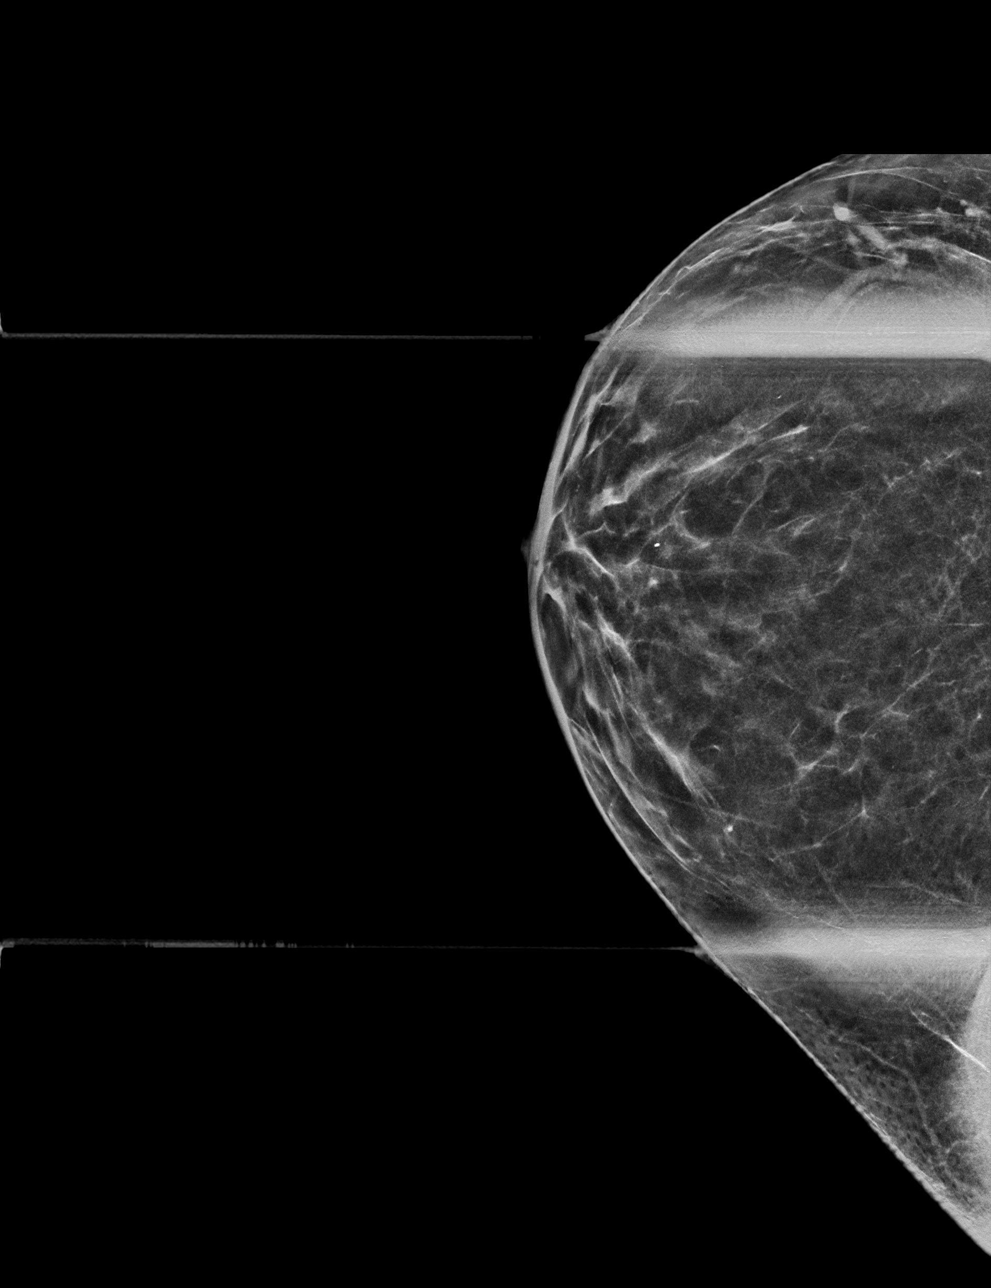

[R MLO tomo · tomo slice 32/63.0]
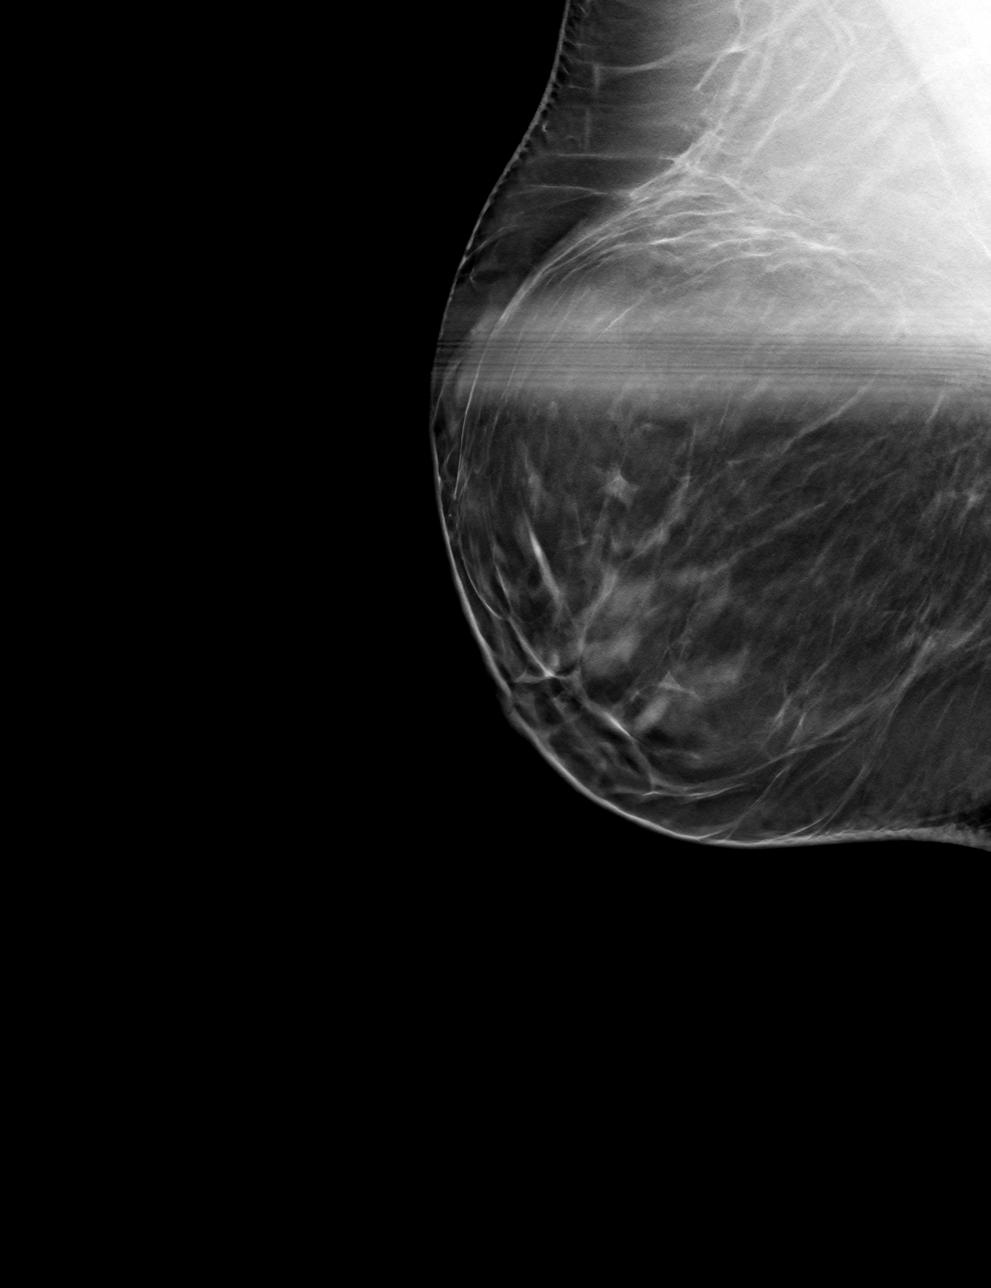

[R CC tomo · tomo slice 25/50.0]
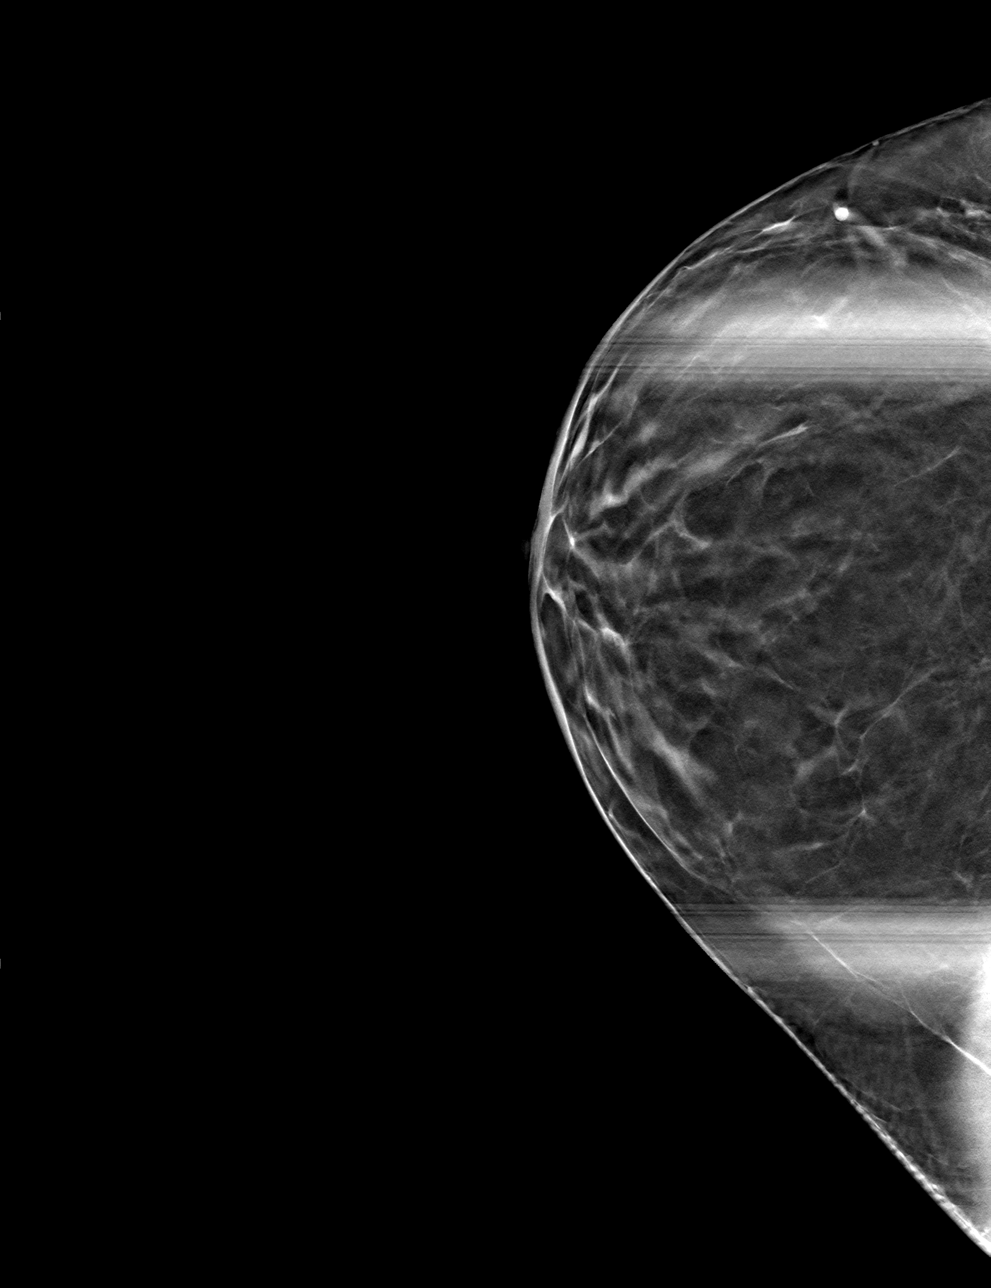

[4 of 12 positions shown; findings below may reference images not displayed]

ACR Breast Density Category b: There are scattered areas of
fibroglandular density.
FINDINGS: The previously noted possible asymmetry in the retroareolar right
breast dissipates on additional views, consistent with overlapping
fibroglandular tissue.
IMPRESSION: No mammographic findings of malignancy.

RECOMMENDATION:
Annual screening mammogram.

I have discussed the findings and recommendations with the patient.
If applicable, a reminder letter will be sent to the patient
regarding the next appointment.

BI-RADS CATEGORY  1: Negative.

## 2022-03-19 ENCOUNTER — Ambulatory Visit: Payer: Medicare Other | Admitting: Physical Therapy

## 2022-03-20 LAB — CULTURE, BLOOD (ROUTINE X 2)
Culture: NO GROWTH
Special Requests: ADEQUATE

## 2022-03-21 LAB — CULTURE, BLOOD (ROUTINE X 2)
Culture: NO GROWTH
Special Requests: ADEQUATE

## 2022-03-22 ENCOUNTER — Other Ambulatory Visit (HOSPITAL_BASED_OUTPATIENT_CLINIC_OR_DEPARTMENT_OTHER): Payer: Medicare Other

## 2022-03-23 ENCOUNTER — Telehealth: Payer: Self-pay | Admitting: *Deleted

## 2022-03-23 ENCOUNTER — Ambulatory Visit (INDEPENDENT_AMBULATORY_CARE_PROVIDER_SITE_OTHER): Payer: Medicare Other | Admitting: Neurology

## 2022-03-23 ENCOUNTER — Encounter: Payer: Self-pay | Admitting: Neurology

## 2022-03-23 ENCOUNTER — Encounter: Payer: Self-pay | Admitting: *Deleted

## 2022-03-23 ENCOUNTER — Other Ambulatory Visit: Payer: Self-pay | Admitting: Family Medicine

## 2022-03-23 ENCOUNTER — Telehealth: Payer: Self-pay

## 2022-03-23 VITALS — BP 121/73 | HR 77 | Ht 64.0 in | Wt 242.0 lb

## 2022-03-23 DIAGNOSIS — Z79899 Other long term (current) drug therapy: Secondary | ICD-10-CM | POA: Diagnosis not present

## 2022-03-23 DIAGNOSIS — G4733 Obstructive sleep apnea (adult) (pediatric): Secondary | ICD-10-CM | POA: Diagnosis not present

## 2022-03-23 DIAGNOSIS — R635 Abnormal weight gain: Secondary | ICD-10-CM | POA: Diagnosis not present

## 2022-03-23 DIAGNOSIS — G4719 Other hypersomnia: Secondary | ICD-10-CM

## 2022-03-23 DIAGNOSIS — Z820 Family history of epilepsy and other diseases of the nervous system: Secondary | ICD-10-CM

## 2022-03-23 NOTE — Telephone Encounter (Signed)
     Patient  visit on 11/28  at [facility]  Have you been able to follow up with your primary care physician? Yes   The patient was or was not able to obtain any needed medicine or equipment. Yes   Are there diet recommendations that you are having difficulty following? Yes   Patient expresses understanding of discharge instructions and education provided has no other needs at this time.  Yes      [name] [name] [facility] Care Guide, [place], Care Management  [phone] [address], [place], [place] [address] Phone: [phone] Email: [email or web address]    

## 2022-03-23 NOTE — Patient Instructions (Addendum)
  Here is what we discussed today and what we came up with as our plan for you:    Based on your symptoms and your exam I believe you are still at risk for obstructive sleep apnea and would benefit from reevaluation as it has been several years and you need a new machine. Therefore, I think we should proceed with a sleep study to determine how severe your sleep apnea is. For your convenience, I will order a home sleep test. If you have more than mild OSA, I want you to consider restarting treatment with CPAP. Please remember, the risks and ramifications of moderate to severe obstructive sleep apnea or OSA are: Cardiovascular disease, including congestive heart failure, stroke, difficult to control hypertension, arrhythmias, and even type 2 diabetes has been linked to untreated OSA. Sleep apnea causes disruption of sleep and sleep deprivation in most cases, which, in turn, can cause recurrent headaches, problems with memory, mood, concentration, focus, and vigilance. Most people with untreated sleep apnea report excessive daytime sleepiness, which can affect their ability to drive. Please do not drive if you feel sleepy.   I will likely see you back after your sleep study to go over the test results and where to go from there. We will call you after your sleep study to advise about the results (most likely, you will hear from Marianna, my nurse) and to set up an appointment at the time, as necessary.    Our sleep lab administrative assistant will call you to schedule your sleep study. If you don't hear back from her by about 2 weeks from now, please feel free to call her at [phone]. You can leave a message with your phone number and concerns, if you get the voicemail box. She will call back as soon as possible.

## 2022-03-23 NOTE — Patient Outreach (Signed)
Care Coordination [facility] [address] Note Transition Care Management Follow-up Telephone Call Date of discharge and from where: ED Visit Monday 03/15/22 [facility]: (R) leg pain/ wound/ cyst, concern for DVT; chest wall pain How have you been since you were released from the hospital? "I don't know really.... I guess I am okay, but I am worried about this leg.... I think it is getting worse, even though I finished up with the antibiotics that I was taking prior to going to the ER.... no, you are right, I have not had any follow up with my PCP yet, I was planning to call him soon.... I told the girl that when she called me earlier about the visit-- told her I had not seen my doctor yet, and also that I wasn't prescribed any more antibiotics by the ER doctor.  The only reason I didn't schedule right away with Dr. Dennard Schaumann is because I had developed a cough and what seemed to be an URI after the ER visit-- that is now much better-- but I think the leg is worse, it is still tender and .  I was planning to call this week to schedule with Dr. Dennard Schaumann because my podiatry appointment is not until 12/18" Any questions or concerns? Yes- believes (R) leg wound is getting worse; antibiotics completed on 03/18/22: coordinated with scheduling care guide in real time to facilitate prompt PCP office visit scheduling post ED visit on 03/15/22: this was successfully accomplished  Items Reviewed: Did the pt receive and understand the discharge instructions provided? Yes  Medications obtained and verified?  Confirmed patient completed taking antibiotics on 03/18/22 that were prescribed prior to ED visit on 03/15/22 Other? No  Any new allergies since your discharge? No  Dietary orders reviewed? Yes Do you have support at home? Yes - mother assisting with all care needs as indicated- patient reports she is essentially independent in self care  Home Care and Equipment/Supplies: Were home health services ordered? no If so, what is the  name of the agency? N/A  Has the agency set up a time to come to the patient's home? not applicable Were any new equipment or medical supplies ordered?  No What is the name of the medical supply agency? N/A Were you able to get the supplies/equipment? not applicable Do you have any questions related to the use of the equipment or supplies? No N/A  Functional Questionnaire: (I = Independent and D = Dependent) ADLs: I  Bathing/Dressing- I  Meal Prep- I  Eating- I  Maintaining continence- I  Transferring/Ambulation- I  Managing Meds- I  Follow up appointments reviewed:  PCP Hospital f/u appt confirmed? Yes  Scheduled to see Dr. Dennard Schaumann PCP on Thursday 04/01/22 @ 2:00 pm: arranged prior to signing of today's note [facility] f/u appt confirmed? Yes  Scheduled to see podiatry provider Dr. Jacqualyn Posey on Monday 04/05/22 @ 09:45 am Are transportation arrangements needed? No  If their condition worsens, is the pt aware to call PCP or go to the Emergency Dept.? Yes Was the patient provided with contact information for the PCP's office or ED? No- declined; states she already has  Was to pt encouraged to call back with questions or concerns? Yes provided my direct contact information should needs or concerns arise prior to next scheduled follow up outreach/ and or PCP office visit  SDOH assessments and interventions completed:   Yes SDOH Interventions Today    Flowsheet Row Most Recent Value  SDOH Interventions   Food Insecurity Interventions  Intervention Not Indicated  Transportation Interventions Intervention Not Indicated  [patient's mother provides transportation]      Care Coordination Interventions:  PCP follow up appointment requested [facility] Care Coordination Services:  RN Care Coordinator Provided education around need to promptly schedule with PCP due to concerns of (R) LE worsening post-recent ED visit; care coordination outreach in real-time with scheduling care  guide to facilitate PCP office visit scheduling post-ED visit; provided education around reasons to return to ED or seek urgent care; scheduled follow up care coordination telephone outreach for [facility] follow up with myself to start with and discussed care management services available to patient through PCP practice    Encounter Outcome:  Pt. Visit Completed    Oneta Rack, RN, BSN, CCRN Alumnus RN CM Care Coordination/ Transition of [place] Management [phone]: direct office    Oneta Rack, RN, BSN, CCRN Alumnus RN CM Care Coordination/ Transition of [place] Management [phone]: direct office

## 2022-03-23 NOTE — Progress Notes (Signed)
Subjective:    Patient ID: [name] [name] is a 56 y.o. female.  HPI    Star Age, MD, PhD [facility] [address], [address] [address] [place], [place] [address]  Dear Dr. Dennard Schaumann,  I saw your patient, [name], upon your kind request in this clinic today for evaluation.  The patient is unaccompanied today.  As you know, Ms. Dull is a 56 year old female with an underlying complex medical history of hypertension, asthma, reflux disease, prediabetes, history of substance use disorder, arthritis, anxiety, depression, history of seizures (by chart review), and obesity, who reports snoring and excessive daytime sleepiness. She was previously diagnosed with sleep apnea.  She is no longer on PAP therapy.  She has not used the CPAP in about 4 years. I reviewed your office note from 02/01/2022.  Her Epworth sleepiness score is 18 out of 24, fatigue severity score is 63 out of 63.  She lives with her mom and mom has also custody over a 76-year-old child (patient's nephew's child).  Patient reports that her bedtime is between 9 and 10 and rise time around 6.  She has rare nocturia and rare headaches at night or in the mornings.  She drinks less than 1 caffeine containing beverage per day, no alcohol, she is a non-smoker.  She does not work.  She has a strong family history of sleep apnea affecting her mom, her maternal grandmother, her brother and her father.  She has had some weight fluctuation.  Of note, she is only several medications including potentially sedating medications and several psychotropic medications including Lamictal, Topamax, Lyrica, Cymbalta, prazosin, oxycodone, Lexapro, BuSpar, Haldol, trazodone, Flexeril.  She is currently in a boot with her right foot as she has an ulcer. Of note, I had evaluated her for sleep apnea several years ago.  She has not been seen in this clinic in over 5 years.  She had a split-night sleep study on 03/01/2016 which showed a  baseline AHI of 46/h, O2 nadir 84%.  She has had some weight gain in the interim.   Previously (copied from previous notes for reference):   06/15/2016:  56 year old right-handed woman with an underlying medical history of bipolar disorder, hypertension, chronic pain and on chronic narcotic pain medications, reflux disease, seizure history, hypertension, anxiety, asthma, and obesity, who presents for follow-up consultation of her obstructive sleep apnea, after her recent split-night sleep study. The patient is accompanied by her mother today. I first met her on 01/12/2016 at the request of her primary care physician, at which time she reported a prior diagnosis of obstructive sleep apnea but had not used her CPAP in years. I invited her for a sleep study. She had a split-night sleep study on 03/01/2016. I went over her test results with her in detail today. Baseline sleep efficiency was 87.2%, sleep latency was 10 minutes. She had absence of REM sleep. She had an increased percentage of stage II sleep and an increased percentage of slow-wave sleep. Total AHI was 46 per hour, average oxygen saturation was 94%, nadir was 84% during non-REM sleep. She had no significant PLMS, EKG or EEG changes. She was then titrated on CPAP starting at 5 cm and advanced to 13 cm. At a pressure of 12 cm she had an AHI of 2.8 per hour, with supine REM sleep achieved on 11 cm. O2 nadir on 12 cm was 91% and she utilized a small fullface mask. She had a good amount of REM sleep  at 23.7% during the second part of the study. She had moderate PLMS with an index of 37.4 per hour with very few arousals. She indicated that she slept better with CPAP. O2 nadir was 86% on treatment, average oxygen saturation improved at 96%. Based on her test results are prescribed CPAP therapy for home use.   I reviewed her CPAP compliance data from 05/14/2016 through 06/12/2016, which is a total of 30 days, during which time she used her CPAP every night  with percent used days greater than 4 hours at 90%, indicating excellent compliance with an average usage of 6 hours and 23 minutes, residual AHI 4 per hour, acceptable, leak acceptable with the 95th percentile at 9.3 L/m on a pressure of 12 cm with EPR of 2. She reports feeling a little better, falls asleep a little better, feels better rested. She has had problems with her right second toe and developed an ulcer that needed treatment with antibiotics. She sees podiatry for this. She also reports more neck pain, she continues to see Dr. Patrice Paradise for this.     01/12/2016: She was previously diagnosed with obstructive sleep apnea. Sleep study results are not available for my review. She was on CPAP therapy in the past but has not used it in years.  She has a Hx of chronic back pain, had multiple upper and lower back surgeries under Dr. Patrice Paradise. Last surgery of upper spine in February 2017. She will be seeing pain management next week.  She had recent R big toe surgery and has to wear a boot for a total of 6 weeks, has been 2 weeks thus far. Has R knee arthroscopic surgery planned for next month, under Dr. French Ana. She had repeat O2 drops in the hospital, she recalls post-surgery. Of note, she was recently hospitalized for chest pain. Workup was negative for cardiac events. She was found to have hypokalemia. I reviewed your office note from 11/20/2015 and 11/25/2015. She has been witnessed to have apneic pauses while asleep per mom. Her Epworth sleepiness score is 17 out of 24 today, her fatigue score is 30 out of 63. She is a nonsmoker. She lives with her mother, has 3 children, is separated. She does not drink alcohol. She quit using illicit drugs in 4008. She drinks quite a bit of caffeine in the form of sodas, 4 16 ounce cups per day.  Bedtime is around 11 PM, then the mornings she gets out of bed around 10 AM. She does not wake up rested. She also endorses restless leg symptoms and leg twitching and kicking  at night. She has a family history of restless leg syndrome and sleep apnea. Her mother has a BiPAP machine, her father had a BiPAP machine as well, he died at 15 from lung cancer.   Her Past Medical History Is Significant For: Past Medical History:  Diagnosis Date   Anxiety    Arthritis    Phreesia 02/08/2020   Asthma    mild intermittent   Asthma    Phreesia 02/08/2020   Bipolar 1 disorder ([place])    ect treatments last treatment Sep 02 1011   Depression    Depression    Phreesia 02/08/2020   Depression    Phreesia 07/10/2020   GERD (gastroesophageal reflux disease)    Hypertension    Pre-diabetes    Seizures ([place])    last seizure was so long ago she can't remember.    Sleep apnea    wears CPAP, uncertain  of setting   Substance abuse ([place])    Phreesia 02/08/2020    Her Past Surgical History Is Significant For: Past Surgical History:  Procedure Laterality Date   ABDOMINAL HYSTERECTOMY     AMPUTATION TOE Left 02/02/2018   Procedure: AMPUTATION TOE Left 4th toe;  Surgeon: [name], DPM;  Location: [facility];  Service: Podiatry;  Laterality: Left;   APPENDECTOMY N/A    Phreesia 02/08/2020   BACK SURGERY     CARPAL TUNNEL RELEASE     x2   IRRIGATION AND DEBRIDEMENT ABSCESS Right 10/25/2020   Procedure: IRRIGATION AND DEBRIDEMENT ABSCESS OF FOOT AND APPLICATION OF GRAFT;  Surgeon: [name], DPM;  Location: [facility];  Service: Podiatry;  Laterality: Right;   Laproscopic knee surgery     NECK SURGERY  03/15/2017   PLANTAR FASCIA RELEASE     x2   SPINE SURGERY N/A    Phreesia 02/08/2020   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   Procedure: TRANSMETATARSAL AMPUTATION;  Surgeon: [name], DPM;  Location: [facility];  Service: Podiatry;  Laterality: Right;    Her Family History Is Significant For: Family History  Problem Relation Age of Onset   Diabetes Mother    COPD Mother    Hypertension Mother    Hyperlipidemia Mother    Sleep apnea Mother     Heart disease Father    Hyperlipidemia Father    Hypertension Father    Cancer Father    Sleep apnea Father    Heart disease Brother    Sleep apnea Brother    Heart disease Maternal Grandmother    Hypertension Maternal Grandmother    Sleep apnea Maternal Grandmother    Heart disease Maternal Grandfather    Kidney cancer Paternal Grandmother    Heart disease Paternal Grandfather    Heart disease Daughter     Her Social History Is Significant For: Social History   Socioeconomic History   Marital status: Widowed    Spouse name: Not on file   Number of children: 3   Years of education: 49   Highest education level: Not on file  Occupational History   Occupation: Disabled  Tobacco Use   Smoking status: Never   Smokeless tobacco: Never  Vaping Use   Vaping Use: Never used  Substance and Sexual Activity   Alcohol use: No   Drug use: No   Sexual activity: Not on file  Other Topics Concern   Not on file  Social History Narrative   Patient drink 4 16oz caffeine drinks a day    Social Determinants of Health   Financial Resource Strain: Low Risk  (08/21/2020)   Overall Financial Resource Strain (CARDIA)    Difficulty of Paying Living Expenses: Not hard at all  Food Insecurity: No Food Insecurity (08/21/2020)   Hunger Vital Sign    Worried About Running Out of Food in the Last Year: Never true    [place] in the Last Year: Never true  Transportation Needs: No Transportation Needs (08/21/2020)   PRAPARE - Hydrologist (Medical): No    Lack of Transportation (Non-Medical): No  Physical Activity: Inactive (08/21/2020)   Exercise Vital Sign    Days of Exercise per Week: 0 days    Minutes of Exercise per Session: 0 min  Stress: No Stress Concern Present (08/21/2020)   [place]    Feeling of Stress : Not at all  Social Connections: Socially Isolated (08/21/2020)   Social Connection  and Isolation Panel [NHANES]    Frequency of Communication with Friends and Family: More than three times a week    Frequency of Social Gatherings with Friends and Family: Twice a week    Attends Religious Services: Never    Marine scientist or Organizations: No    Attends Archivist Meetings: Never    Marital Status: Widowed    Her Allergies Are:  Allergies  Allergen Reactions   Other    Penicillins Hives and Rash    No anaphylactic or severe cutaneous Sx > 10 yr ago, no additional medical attention required Tolerates Keflex and Rocephin   Tetracyclines & Related Other (See Comments)    Syncope and put her "in a coma"   Tramadol Other (See Comments)    Seizures   Phenazopyridine Other (See Comments)    Unknown   Tetracycline Other (See Comments)    Syncope and "Put me in a coma"   Ciprofloxacin Rash and Itching   Codeine Itching and Rash   Estradiol Rash    Patches broke out the skin  :   Her Current Medications Are:  Outpatient Encounter Medications as of 03/23/2022  Medication Sig   albuterol (PROAIR HFA) 108 (90 Base) MCG/ACT inhaler Inhale 2 puffs = 12mg into the lungs every 6 (six) hours as needed for wheezing or shortness of breath.   busPIRone (BUSPAR) 15 MG tablet Take 15 mg by mouth 3 (three) times daily.    Calcium Carbonate-Vit D-Min (CALCIUM 1200 PO) Take 1,200 mg by mouth daily.    clindamycin (CLEOCIN) 300 MG capsule Take 1 capsule (300 mg total) by mouth 3 (three) times daily.   cyclobenzaprine (FLEXERIL) 10 MG tablet Take 10 mg by mouth 2 (two) times daily as needed for muscle spasms.    DULoxetine (CYMBALTA) 60 MG capsule TAKE 1 CAPSULE BY MOUTH EVERY DAY   Erenumab-aooe (AIMOVIG) 70 MG/ML SOAJ administer 151mUNDER THE SKIN every 30 DAYS   escitalopram (LEXAPRO) 10 MG tablet Take 10 mg by mouth daily.   estradiol (ESTRACE) 1 MG tablet Take 1 tablet (1 mg total) by mouth at bedtime.   famotidine (PEPCID) 40 MG tablet Take 1 tablet (40 mg  total) by mouth at bedtime.   fluticasone (FLONASE) 50 MCG/ACT nasal spray SPRAY 2 SPRAYS INTRO EACH NOSTRIL EVERY DAY (Patient taking differently: Place 2 sprays into both nostrils daily as needed for allergies.)   haloperidol (HALDOL) 5 MG tablet Take 1 tablet (5 mg total) by mouth at bedtime. For psychosis   lamoTRIgine (LAMICTAL) 25 MG tablet Take by mouth.   levocetirizine (XYZAL) 5 MG tablet Take 1 tablet (5 mg total) by mouth every evening.   meloxicam (MOBIC) 15 MG tablet Take 15 mg by mouth at bedtime.    metoprolol succinate (TOPROL-XL) 25 MG 24 hr tablet Take 1 tablet (25 mg total) by mouth daily.   MOVANTIK 25 MG TABS tablet 25 mg.   Multiple Vitamins-Minerals (MULTIVITAMIN WITH MINERALS) tablet Take 1 tablet by mouth daily.   naloxone (NARCAN) nasal spray 4 mg/0.1 mL    ondansetron (ZOFRAN) 4 MG tablet Take 1 tablet (4 mg total) by mouth every 8 (eight) hours as needed for nausea or vomiting.   OVER THE COUNTER MEDICATION Take 1 capsule by mouth daily. Fennel Seed   oxyCODONE-acetaminophen (PERCOCET) 7.5-325 MG tablet Take 1 tablet by mouth 6 (six) times daily.   pantoprazole (PROTONIX) 40 MG tablet  Take 1 tablet (40 mg total) by mouth 2 (two) times daily.   prazosin (MINIPRESS) 2 MG capsule Take 2 mg by mouth at bedtime.   pregabalin (LYRICA) 100 MG capsule Take 1 capsule (100 mg total) by mouth 2 (two) times daily.   solifenacin (VESICARE) 10 MG tablet Take 1 tablet (10 mg total) by mouth daily.   spironolactone (ALDACTONE) 50 MG tablet TAKE ONE TABLET BY MOUTH DAILY   sulfamethoxazole-trimethoprim (BACTRIM DS) 800-160 MG tablet Take 1 tablet by mouth 2 (two) times daily.   SUMAtriptan (IMITREX) 50 MG tablet TAKE 1 TAB EVERY 2 HOURS AS NEEDED FOR MIGRAINE. MAY REPEAT IN 2 HOURS IF PERSISTS OR RECURS (Patient taking differently: Take 50 mg by mouth every 2 (two) hours as needed for migraine.)   topiramate (TOPAMAX) 50 MG tablet Take 1 tablet (50 mg total) by mouth 2 (two) times  daily.   traZODone (DESYREL) 100 MG tablet Take 200 mg by mouth at bedtime.   LINZESS 290 MCG CAPS capsule TAKE ONE CAPSULE BY MOUTH EVERY MORNING   loperamide (IMODIUM) 2 MG capsule Take 2 mg by mouth 4 (four) times daily as needed.   promethazine (PHENERGAN) 25 MG tablet Take 1 tablet (25 mg total) by mouth every 8 (eight) hours as needed for nausea or vomiting.   No facility-administered encounter medications on file as of 03/23/2022.  :   Review of Systems:  Out of a complete 14 point review of systems, all are reviewed and negative with the exception of these symptoms as listed below:  Review of Systems  Neurological:        Pt here for sleep consult Pt snores,fatigue,few headaches, controlled hypertension  Pt last study 2017 ,no CPAP    ESS:18 FSS:63     Objective:  Neurological Exam  Physical Exam Physical Examination:   Vitals:   03/23/22 1137  BP: 121/73  Pulse: 77    General Examination: The patient is a very pleasant 56 y.o. female in no acute distress. She appears well-developed and well-nourished and well groomed.   HEENT: Normocephalic, atraumatic, pupils are equal, round and reactive to light, corrective eyeglasses in place.  Tracking is well-preserved, hearing is grossly intact. Face is symmetric with normal facial animation. Speech is clear with no dysarthria noted. There is no hypophonia. There is no lip, neck/head, jaw or voice tremor. Neck is supple with full range of passive and active motion. There are no carotid bruits on auscultation. Oropharynx exam reveals: Moderate mouth dryness, adequate dental hygiene and moderate airway crowding. Mallampati is class II. Tongue protrudes centrally and palate elevates symmetrically. Tonsils are 2+ in size.  Neck circumference 15-1/2 inches, moderate to significant overbite noted.   Chest: Clear to auscultation without wheezing, rhonchi or crackles noted.   Heart: S1+S2+0, regular and normal without murmurs, rubs or  gallops noted.    Abdomen: Soft, non-tender and non-distended.   Extremities: There is no pitting edema in the left distal lower extremity, right foot in a boot.      Skin: Warm and dry without trophic changes noted.   Musculoskeletal: exam reveals no obvious joint deformities, tenderness or joint swelling or erythema.    Neurologically:  Mental status: The patient is awake, alert and oriented in all 4 spheres. Her immediate and remote memory, attention, language skills and fund of knowledge are appropriate. There is no evidence of aphasia, agnosia, apraxia or anomia. Speech is clear with normal prosody and enunciation. Thought process is linear. Mood is normal  and affect is normal.  Cranial nerves II - XII are as described above under HEENT exam.  Motor exam: Normal bulk, strength and tone is noted, limited mobility right foot and leg. There is no obvious resting or action tremor in the upper extremities.  Fine motor skills and coordination: intact grossly in the upper extremities.  Cerebellar testing: No dysmetria or intention tremor.  Sensory exam: intact to light touch in the upper and lower extremities.  Gait, station and balance: She stands with mild difficulty.  She walks with a cane.  She walks with a limp, walks with a walking boot.    Assessment and Plan:  In summary, DASIAH HOOLEY is a very pleasant 56 y.o.-year old female with an underlying complex medical history of hypertension, asthma, reflux disease, prediabetes, history of substance use disorder, arthritis, anxiety, depression, history of seizures (by chart review), and obesity, who presents for reevaluation of her obstructive sleep apnea.  She has not had a PAP machine in over 4 years, she had interim weight gain, she reports significant daytime somnolence with her being in part secondary to multiple medications that can be sedating.  I do believe polypharmacy is a concern here.  Nevertheless, we talked about sleep apnea, its  prognosis and treatment options, she would be willing to get retested.  She has limited mobility and has a boot on her right foot because of an ulcer.  We mutually agreed to pursue a home sleep test at this time.   I talked to the patient about the diagnosis of OSA, its prognosis and treatment options.  She would be willing to restart treatment with a CPAP or AutoPap machine.   I explained the risks and ramifications of untreated moderate to severe OSA, especially with respect to developing cardiovascular disease down the road, including congestive heart failure (CHF), difficult to treat hypertension, cardiac arrhythmias (particularly A-fib), neurovascular complications including TIA, stroke and dementia. Even type 2 diabetes has, in part, been linked to untreated OSA. Symptoms of untreated OSA may include (but may not be limited to) daytime sleepiness, nocturia (i.e. frequent nighttime urination), memory problems, mood irritability and suboptimally controlled or worsening mood disorder such as depression and/or anxiety, lack of energy, lack of motivation, physical discomfort, as well as recurrent headaches, especially morning or nocturnal headaches. We talked about the importance of maintaining a healthy lifestyle and striving for healthy weight. We talked about possible alternative treatment options for sleep apnea including a dental device, inspire, surgical options otherwise, but mutually agreed to pursue PAP therapy as our first-line treatment.  We will pick up our discussion about the next steps and treatment options after testing.  We will keep her posted as to the test results by phone call and/or MyChart messaging where possible.  We will plan to follow-up in sleep clinic accordingly as well.  I answered all her questions today and the patient was in agreement.   I encouraged her to call with any interim questions, concerns, problems or updates or email [place] through [place].  Generally speaking, sleep  test authorizations may take up to 2 weeks, sometimes less, sometimes longer, the patient is encouraged to get in touch with [place] if they do not hear back from the sleep lab staff directly within the next 2 weeks.  Thank you very much for allowing me to participate in the care of this nice patient. If I can be of any further assistance to you please do not hesitate to call me at [phone].  Sincerely,   Star Age, MD, PhD

## 2022-03-23 NOTE — Progress Notes (Signed)
  Care Coordination  Note  03/23/2022 Name: [name] [name] MRN: [id number] DOB: [birth year]  [name] [name] is a 56 y.o. year old primary care patient of Pickard, Cammie Mcgee, MD. I reached out to [name] by phone today to assist with scheduling a follow up appointment. [name] verbally consented to my assistance.       Follow up plan: Hospital Follow Up appointment scheduled with Dr. Dennard Schaumann on 04/01/22 at 2:00.  [place]  Direct Dial: [phone]

## 2022-03-24 ENCOUNTER — Encounter: Payer: Self-pay | Admitting: *Deleted

## 2022-03-24 ENCOUNTER — Telehealth: Payer: Self-pay | Admitting: *Deleted

## 2022-03-24 ENCOUNTER — Other Ambulatory Visit: Payer: Self-pay | Admitting: Podiatry

## 2022-03-24 ENCOUNTER — Encounter: Payer: Self-pay | Admitting: Physical Therapy

## 2022-03-24 ENCOUNTER — Ambulatory Visit
Payer: Medicare Other | Attending: Student in an Organized Health Care Education/Training Program | Admitting: Physical Therapy

## 2022-03-24 DIAGNOSIS — M25552 Pain in left hip: Secondary | ICD-10-CM | POA: Insufficient documentation

## 2022-03-24 DIAGNOSIS — R2689 Other abnormalities of gait and mobility: Secondary | ICD-10-CM | POA: Insufficient documentation

## 2022-03-24 DIAGNOSIS — Z79891 Long term (current) use of opiate analgesic: Secondary | ICD-10-CM | POA: Diagnosis not present

## 2022-03-24 DIAGNOSIS — K5903 Drug induced constipation: Secondary | ICD-10-CM | POA: Diagnosis not present

## 2022-03-24 DIAGNOSIS — M6281 Muscle weakness (generalized): Secondary | ICD-10-CM | POA: Diagnosis not present

## 2022-03-24 DIAGNOSIS — M25652 Stiffness of left hip, not elsewhere classified: Secondary | ICD-10-CM | POA: Diagnosis not present

## 2022-03-24 DIAGNOSIS — M25559 Pain in unspecified hip: Secondary | ICD-10-CM | POA: Diagnosis not present

## 2022-03-24 NOTE — Patient Instructions (Signed)
Visit Information  Thank you for taking time to visit with me today. Please don't hesitate to contact me if I can be of assistance to you.   Following are the goals we discussed today:   Goals Addressed             This Visit's Progress    Care Coordination Activities   On track    Care Coordination Interventions: Evaluation of current treatment plan related to chronic neuropathy/ pain with chronic bilateral foot ulcers and patient's adherence to plan as established by provider Advised patient to contact podiatry today to report her concerns with possible worsening (L) toe ulceration; patient states Dr. Jacqualyn Posey often prescribed prophylactic antibiotics for her as needed in between regular podiatry visits; encouraged patient to also inquire if she may be able to obtain sooner appointment for follow up  Provided education to patient re: signs/ symptoms infection/ sepsis/ osteomyelitis along with corresponding action plan Reviewed scheduled/upcoming provider appointments including PCP 04/01/22- post ED visit follow up; Podiatry 04/05/22- routine/ regular follow up on chronic bilateral foot/ toe ulcerations Discussed plans with patient for ongoing care management follow up and provided patient with direct contact information for care management team Advised patient to discuss her concerns with possible (L) toe ulcer worsening and to inform him of her recent (R) leg swelling that required ED visit on 03/15/22  with provider Completed additional assessment of patient's stated concerns from ED [facility] call yesterday--- patient denies need for sooner appointment with PCP, prefers to contact established podiatry provider as per their established plan of care; reviewed reasons that patient should seek urgent/ emergent care; confirmed patient has my direct contact number and encouraged her to promptly contact me should additional concerns arise in setting of chronic bilateral foot wound status and made patient  aware of options for additional care needs if necessary- patient declines need today and assures me she will contact me should needs arise; confirmed my scheduled follow up call on Monday 03/29/22          Our next appointment is by telephone on Monday March 29, 2022 at 11:00 am  Please call the care guide team at [phone] if you need to cancel or reschedule your appointment.   If you are experiencing a Mental Health or [place] or need someone to talk to, please  call the Suicide and Crisis Lifeline: 988 call the [place] National Suicide Prevention Lifeline: 321-700-6942 or TTY: (270)227-3886 TTY (408) 851-5393) to talk to a trained counselor call 1-800-273-TALK (toll free, 24 hour hotline) go to [facility] [address] [place] [phone]) call the [place]: ([phone] call 911   Patient verbalizes understanding of instructions and care plan provided today and agrees to view in [place]. Active MyChart status and patient understanding of how to access instructions and care plan via MyChart confirmed with patient.     Telephone follow up appointment with care management team member scheduled for: Monday 03/29/22  Oneta Rack, RN, BSN, [place] RN CM Care Coordination/ Transition of [place] Management [phone]: direct office

## 2022-03-24 NOTE — Progress Notes (Signed)
error 

## 2022-03-24 NOTE — Telephone Encounter (Signed)
Left foot  is getting worse,real red after soaking ,is peeling inside of the toe,please advise.

## 2022-03-24 NOTE — Therapy (Signed)
OUTPATIENT PHYSICAL THERAPY TREATMENT NOTE   Patient Name: [name] [name] MRN: [id number] DOB:1966-01-20, 56 y.o., female Today's Date: 03/24/2022  PCP: [name], MD   REFERRING PROVIDER: Harrel Carina, MD  END OF SESSION:   PT End of Session - 03/24/22 0804     Visit Number 2    Number of Visits 17    Date for PT Re-Evaluation 04/30/22    Authorization Type [employer] medicare/medicaid    Authorization Time Period no auth, FOTO v6 v10, kx 15    PT Start Time 0805    PT Stop Time 0845    PT Time Calculation (min) 40 min             Past Medical History:  Diagnosis Date   Anxiety    Arthritis    Phreesia 02/08/2020   Asthma    mild intermittent   Asthma    Phreesia 02/08/2020   Bipolar 1 disorder ([place])    ect treatments last treatment Sep 02 1011   Depression    Depression    Phreesia 02/08/2020   Depression    Phreesia 07/10/2020   GERD (gastroesophageal reflux disease)    Hypertension    Pre-diabetes    Seizures ([place])    last seizure was so long ago she can't remember.    Sleep apnea    wears CPAP, uncertain of setting   Substance abuse ([place])    Phreesia 02/08/2020   Past Surgical History:  Procedure Laterality Date   ABDOMINAL HYSTERECTOMY     AMPUTATION TOE Left 02/02/2018   Procedure: AMPUTATION TOE Left 4th toe;  Surgeon: [name], DPM;  Location: [facility];  Service: Podiatry;  Laterality: Left;   APPENDECTOMY N/A    Phreesia 02/08/2020   BACK SURGERY     CARPAL TUNNEL RELEASE     x2   IRRIGATION AND DEBRIDEMENT ABSCESS Right 10/25/2020   Procedure: IRRIGATION AND DEBRIDEMENT ABSCESS OF FOOT AND APPLICATION OF GRAFT;  Surgeon: [name], DPM;  Location: [facility];  Service: Podiatry;  Laterality: Right;   Laproscopic knee surgery     NECK SURGERY  03/15/2017   PLANTAR FASCIA RELEASE     x2   SPINE SURGERY N/A    Phreesia 02/08/2020   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   Procedure: TRANSMETATARSAL AMPUTATION;  Surgeon:  [name], DPM;  Location: [facility];  Service: Podiatry;  Laterality: Right;   Patient Active Problem List   Diagnosis Date Noted   Diarrhea in adult patient    Weakness 06/30/2021   Gastroenteritis, infectious 06/30/2021   AKI (acute kidney injury) ([place]) 06/30/2021   Hyponatremia 06/30/2021   Open wound of right foot, initial encounter 10/21/2020   Finger ulcer ([place]) 10/21/2020   Syncope 08/26/2019   Non-healing open wound of toe 05/15/2019   Osteomyelitis ([place]) 05/15/2019   Palpitations 10/26/2018   Foot ulcer ([place]) 01/31/2018   Hyperglycemia 01/31/2018   Left leg cellulitis 09/24/2017   Syncope and collapse    Near syncope 11/23/2016   Abdominal pain 09/23/2016   Anxiety 09/23/2016   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[place]) 09/23/2016   Constipation 09/23/2016   DDD (degenerative disc disease), lumbosacral 09/23/2016   Migraines 09/23/2016   Myelopathy ([place]) 09/23/2016   Nausea 09/23/2016   Post laminectomy syndrome 09/23/2016   Pseudoarthrosis of lumbar spine 09/23/2016   Shortness of breath 09/23/2016   Sleep apnea 09/23/2016   Spinal stenosis of lumbar region 09/23/2016   Vertigo 09/23/2016  Hallux malleus 11/15/2015   Chronic pain syndrome    GERD (gastroesophageal reflux disease)    Depression    Rash 11/14/2015   Toe ulcer, right ([place]) 04/11/2015   Cellulitis of toe of right foot 04/11/2015   Nonspecific chest pain    Chest pain 10/26/2014   Obesity (BMI 30-39.9) 10/26/2014   Hypokalemia 10/26/2014   Manic bipolar I disorder ([place]) 10/26/2014   Atypical angina 10/26/2014   Tardive dyskinesia 08/26/2011   Manic bipolar I disorder with rapid cycling ([place]) 05/18/2011    Class: Acute   Asthma 02/24/2011   Essential hypertension 02/24/2011   History of migraine headaches 02/24/2011   REFERRING DIAG: primary OA of left hip  THERAPY DIAG:  Pain in left hip  Stiffness of left hip, not elsewhere classified  Muscle weakness (generalized)  Other  abnormalities of gait and mobility  Rationale for Evaluation and Treatment Rehabilitation  PERTINENT HISTORY: atypical angina, HTN, migraines, near syncope, GERD, myelopathy, bipolar disorder, chronic pain syndrome, 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 Hx multiple lumbar fusions, most recently about a month ago, ulcer R foot with current CAM boot  PRECAUTIONS: fall risk, lumbar fusion, CAM boot RLE, cardiac hx per chart (although pt denies any current issues)  SUBJECTIVE:                                                                                                                                                                                      SUBJECTIVE STATEMENT:  My hip has been bothering my right much lately , It is getting harder to walk. I have to sleep in my lift chair.    PAIN:  Are you having pain: 4/10 at rest, up to 7-8/10 with weightbearing.  Location: L hip  How would you describe your pain? sharp Best in past week: 0/10 Worst in past week: 8/10 (eases in about 54mn or so) Aggravating factors: bending, walking, lying flat, stairs Easing factors: rest, ice, medication (prescription)   OBJECTIVE: (objective measures completed at initial evaluation unless otherwise dated)   DIAGNOSTIC FINDINGS: no hip imaging in chart, pt states she had L hip MRI that showed arthritis and labral tear   PATIENT SURVEYS:  FOTO 40%   COGNITION: Overall cognitive status: Within functional limits for tasks assessed                         SENSATION: Chronic numbness/tingling in legs, light touch appears grossly intact on exam although not formally assessed given CAM boot   POSTURE: forward head, rounded shoulders, trunk lean to R     LOWER EXTREMITY ROM:  Active  Right eval Left eval  Hip flexion      Hip extension      Hip internal rotation (seated) 22 16p!  Hip external rotation (seated) 38 28 mild pain   (Blank rows = not tested)   Comments:  concordant pain  w/ IR on L. PROM grossly comparable to AROM, limited by pain/stiffness   LOWER EXTREMITY MMT:     MMT Right eval Left eval  Hip flexion 4 3+ p!  Hip abduction (modified sitting) 4+ 4+  Hip internal rotation      Hip external rotation      Knee flexion 4+ 4- p!   Knee extension 4- 4- hip pain   (Blank rows = not tested)   Comments:    FUNCTIONAL TESTS:  5xSTS: 12sec w/ UE support, increasing pain with repetition, standard chair, reduced velocity with repetition   GAIT: Distance walked: within clinic Assistive device utilized: Quad cane small base, CAM boot RLE Level of assistance: Modified independence Comments: reduced gait speed/cadence, increased lateral weight shifting, reduced step length on R     TODAY'S TREATMENT:                                                                                                                                [facility] Adult PT Treatment:                                                DATE: 03/24/22 Therapeutic Exercise: Seated March 10 x 2 each Seated Red band clam 10 x 2  Seated LAQ Left 10 x 2  Seated heel slide with cloth on floor 10 x 2  Nustep L3 LE only x 5 mintes  STS from elevated mat x 5 with UE, x 5 without UE STS x 5 from standard chair, without UE x 5    [facility] Adult PT Treatment:                                                DATE: 03/05/22 Therapeutic Exercise: Seated march x10 LLE, cues for HEP and form Adductor isos x10, cues for HEP setup and form     PATIENT EDUCATION:  Education details: Pt education on PT impairments, prognosis, and POC. Informed consent. Rationale for interventions, safe/appropriate HEP performance Person educated: Patient Education method: Explanation, Demonstration, Tactile cues, Verbal cues, and Handouts Education comprehension: verbalized understanding, returned demonstration, verbal cues required, tactile cues required, and needs further education     HOME EXERCISE PROGRAM: Access Code:  [email or web address] URL: [email or web address] Date: 03/05/2022 Prepared by: Enis Slipper   Exercises - Seated March  - 1 x daily - 7 x weekly -  3 sets - 10 reps - Seated Hip Adduction Isometrics with Ball  - 1 x daily - 7 x weekly - 3 sets - 10 reps Added 03/24/22 - seated clam with resistance band  - 1 x daily - 7 x weekly - 3 sets - 10 reps - Seated Long Arc Quad  - 1 x daily - 7 x weekly - 2 sets - 10 reps - Seated Knee Flexion Extension AROM   - 1 x daily - 7 x weekly - 2 sets - 10 reps - Sit to stand with control  - 1 x daily - 7 x weekly - 2 sets - 5 reps   ASSESSMENT:   CLINICAL IMPRESSION: Pt is a 56 y.o woman who arrives to PT session on this date for treatment of L hip pain. She arrives with [facility], antalgic gait and in a right Cam boot. She reports compliance with HEP however increased pain with ball squeeze. Continued with seated hip/knee AROM with good tolerance. Able to begin Nustep for LE ROM/strength with no c/o pain. Began STS from elevated surface and able to rise without UE. Attempted standard chair for STS and pt able to rise without UE but with increased right knee pain. Her HEP was updated. She reported feeling fine at end of session.  She will benefit from continued skilled PT to address aforementioned deficits and maximize functional independence, minimize fall risk   OBJECTIVE IMPAIRMENTS: Abnormal gait, decreased activity tolerance, decreased balance, decreased endurance, decreased mobility, difficulty walking, decreased ROM, decreased strength, and pain.    ACTIVITY LIMITATIONS: carrying, lifting, bending, standing, squatting, stairs, transfers, and locomotion level   PARTICIPATION LIMITATIONS: cleaning, laundry, and community activity   PERSONAL FACTORS: Time since onset of injury/illness/exacerbation and 3+ comorbidities: atypical angina, HTN, myelopathy, bipolar disorder, chronic pain syndrome, 2021 R TMA, L digit 4 amputation 2019  are also affecting patient's  functional outcome.    REHAB POTENTIAL: Fair given chronicity and comorbidities   CLINICAL DECISION MAKING: Evolving/moderate complexity   EVALUATION COMPLEXITY: Moderate     GOALS: Goals reviewed with patient? Yes   SHORT TERM GOALS: Target date: 04/02/2022  Pt will demonstrate appropriate understanding and performance of initially prescribed HEP in order to facilitate improved independence with management of symptoms.  Baseline: HEP provided on eval Goal status: INITIAL    2. Pt will score greater than or equal to 46% on FOTO in order to demonstrate improved perception of function due to symptoms.            Baseline: 40%            Goal status: INITIAL    LONG TERM GOALS: Target date: 04/30/2022    Pt will score 52% or greater on FOTO in order to demonstrate improved perception of function due to symptoms.  Baseline: 40% Goal status: INITIAL   2.  Pt will demonstrate at least 20 degrees of L hip IR AROM in order to facilitate improved tolerance/mechanics with functional mobility.  Baseline: see ROM chart above Goal status: INITIAL   3.  Pt will demonstrate grossly symmetrical hip MMT to facilitate improved functional strength.   Baseline: see MMT chart above Goal status: INITIAL   4.  Pt will be able to perform 5xSTS in less than or equal to 10 sec w/o UE support in order to demonstrate reduced fall risk and improved functional independence (MCID 5xSTS = 2.3 sec). Baseline: 12sec heavy UE use standard chair Goal status: INITIAL   5. Pt will  report ability to walk/stand up to 30 min with less than 4/10 pain on NPS in order to facilitate improved tolerance to household tasks.             Baseline: up to 8/10 with household tasks            Goal status: INITIAL   PLAN:   PT FREQUENCY: 1-2x/week   PT DURATION: 8 weeks   PLANNED INTERVENTIONS: Therapeutic exercises, Therapeutic activity, Neuromuscular re-education, Balance training, Gait training, Patient/Family  education, Self Care, Joint mobilization, Stair training, Aquatic Therapy, Cryotherapy, Moist heat, Taping, Manual therapy, and Re-evaluation   PLAN FOR NEXT SESSION: Progress ROM/strengthening exercises as able/appropriate, review HEP.      Hessie Diener, PTA 03/24/22 10:40 AM Phone: [phone] Fax: [phone]

## 2022-03-24 NOTE — Patient Outreach (Addendum)
Care Coordination   Follow Up Visit Note   03/24/2022 Name: [name] [name] MRN: [id number] DOB: [birth year]  [name] [name] is a 56 y.o. year old female who sees Pickard, Cammie Mcgee, MD for primary care. I spoke with  [name] [name] by phone today, in follow up from ED [facility] call placed yesterday with patient.  What matters to the patients health and wellness today?  "Thank you for calling me back... I am fine with seeing Dr. Dennard [name] next week on Thursday, I think that will be okay.  It is not my (R) foot or leg that I am worried about, I should have told you that yesterday when you called me.... it is the left toe ulcer that I am concerned about--  I am a little bit worried that the ulcer on my left toe is not improving as it should.  Dr. Jacqualyn [name] has told me to call him if I ever have any concerns, and now that my appointments are done for today, I will go ahead and call him now-- he usually just re-prescribes antibiotics for me.  I will call you back if I have any trouble, and will ask for a sooner appointment, as you have advised.  The (R) lower leg that I went to the ER about is still swollen, but really, it is not any worse or any better-- it is about the same-- but I will also make Dr. Dennard [name] and Dr. Jacqualyn [name] aware of the right lower leg swelling.  I will call you back if anything changes or gets worse"  Interventions provided; follow up care coordination telephone outreach scheduled with myself and patient on 03/29/22; patient understands to contact me for any new concerns or questions and confirms she has my direct phone number.   Goals Addressed             This Visit's Progress    Care Coordination Activities   On track    Care Coordination Interventions: Evaluation of current treatment plan related to chronic neuropathy/ pain with chronic bilateral foot ulcers and patient's adherence to plan as established by provider Advised patient to contact podiatry today to report her concerns  with possible worsening (L) toe ulceration; patient states Dr. Jacqualyn [name] often prescribed prophylactic antibiotics for her as needed in between regular podiatry visits; encouraged patient to also inquire if she may be able to obtain sooner appointment for follow up  Provided education to patient re: signs/ symptoms infection/ sepsis/ osteomyelitis along with corresponding action plan; provided additional educational materials via MyChart Reviewed scheduled/upcoming provider appointments including PCP 04/01/22- post ED visit follow up; Podiatry 04/05/22- routine/ regular follow up on chronic bilateral foot/ toe ulcerations Discussed plans with patient for ongoing care management follow up and provided patient with direct contact information for care management team Advised patient to discuss her concerns with possible (L) toe ulcer worsening and to inform him of her recent (R) leg swelling that required ED visit on 03/15/22  with provider Completed additional assessment of patient's stated concerns from ED [facility] call yesterday--- patient denies need for sooner appointment with PCP, prefers to contact established podiatry provider as per their established plan of care; reviewed reasons that patient should seek urgent/ emergent care; confirmed patient has my direct contact number and encouraged her to promptly contact me should additional concerns arise in setting of chronic bilateral foot wound status and made patient aware of options for additional care needs if necessary- patient declines need today and assures me  she will contact me should needs arise; confirmed my scheduled follow up call on Monday 03/29/22          SDOH assessments and interventions completed:  No SDOH assessment successfully completed yesterday during ED [facility] call   Care Coordination Interventions:  Yes, provided   Follow up plan: Follow up call scheduled for Monday 03/29/22 at 11:00 am    Encounter Outcome:  Pt. Visit Completed    [name] Rack, RN, BSN, CCRN Alumnus RN CM Care Coordination/ Transition of [place] Management [phone]: direct office

## 2022-03-25 ENCOUNTER — Other Ambulatory Visit: Payer: Self-pay | Admitting: Podiatry

## 2022-03-25 MED ORDER — SULFAMETHOXAZOLE-TRIMETHOPRIM 800-160 MG PO TABS: 1.0000 | ORAL_TABLET | Freq: Two times a day (BID) | ORAL | 0 refills | Status: DC

## 2022-03-26 ENCOUNTER — Encounter: Payer: Self-pay | Admitting: Physical Therapy

## 2022-03-26 ENCOUNTER — Ambulatory Visit: Payer: Medicare Other | Admitting: Physical Therapy

## 2022-03-26 ENCOUNTER — Ambulatory Visit: Payer: Medicare Other | Admitting: Podiatry

## 2022-03-26 DIAGNOSIS — M25652 Stiffness of left hip, not elsewhere classified: Secondary | ICD-10-CM

## 2022-03-26 DIAGNOSIS — M6281 Muscle weakness (generalized): Secondary | ICD-10-CM | POA: Diagnosis not present

## 2022-03-26 DIAGNOSIS — R2689 Other abnormalities of gait and mobility: Secondary | ICD-10-CM | POA: Diagnosis not present

## 2022-03-26 DIAGNOSIS — M25552 Pain in left hip: Secondary | ICD-10-CM | POA: Diagnosis not present

## 2022-03-26 NOTE — Therapy (Signed)
OUTPATIENT PHYSICAL THERAPY TREATMENT NOTE   Patient Name: [name] [name] MRN: [id number] DOB:Apr 24, 1965, 56 y.o., female Today's Date: 03/26/2022  PCP: [name], MD   REFERRING PROVIDER: Harrel Carina, MD  END OF SESSION:   PT End of Session - 03/26/22 0805     Visit Number 3    Number of Visits 17    Date for PT Re-Evaluation 04/30/22    Authorization Type [employer] medicare/medicaid    Authorization Time Period no auth, FOTO v6 v10, kx 15    Progress Note Due on Visit 10    PT Start Time 0804    PT Stop Time 0845    PT Time Calculation (min) 41 min             Past Medical History:  Diagnosis Date   Anxiety    Arthritis    Phreesia 02/08/2020   Asthma    mild intermittent   Asthma    Phreesia 02/08/2020   Bipolar 1 disorder ([place])    ect treatments last treatment Sep 02 1011   Depression    Depression    Phreesia 02/08/2020   Depression    Phreesia 07/10/2020   GERD (gastroesophageal reflux disease)    Hypertension    Pre-diabetes    Seizures ([place])    last seizure was so long ago she can't remember.    Sleep apnea    wears CPAP, uncertain of setting   Substance abuse ([place])    Phreesia 02/08/2020   Past Surgical History:  Procedure Laterality Date   ABDOMINAL HYSTERECTOMY     AMPUTATION TOE Left 02/02/2018   Procedure: AMPUTATION TOE Left 4th toe;  Surgeon: [name], DPM;  Location: [place];  Service: Podiatry;  Laterality: Left;   APPENDECTOMY N/A    Phreesia 02/08/2020   BACK SURGERY     CARPAL TUNNEL RELEASE     x2   IRRIGATION AND DEBRIDEMENT ABSCESS Right 10/25/2020   Procedure: IRRIGATION AND DEBRIDEMENT ABSCESS OF FOOT AND APPLICATION OF GRAFT;  Surgeon: [name], DPM;  Location: [place];  Service: Podiatry;  Laterality: Right;   Laproscopic knee surgery     NECK SURGERY  03/15/2017   PLANTAR FASCIA RELEASE     x2   SPINE SURGERY N/A    Phreesia 02/08/2020   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   Procedure:  TRANSMETATARSAL AMPUTATION;  Surgeon: [name], DPM;  Location: [facility];  Service: Podiatry;  Laterality: Right;   Patient Active Problem List   Diagnosis Date Noted   Diarrhea in adult patient    Weakness 06/30/2021   Gastroenteritis, infectious 06/30/2021   AKI (acute kidney injury) ([place]) 06/30/2021   Hyponatremia 06/30/2021   Open wound of right foot, initial encounter 10/21/2020   Finger ulcer ([place]) 10/21/2020   Syncope 08/26/2019   Non-healing open wound of toe 05/15/2019   Osteomyelitis ([place]) 05/15/2019   Palpitations 10/26/2018   Foot ulcer ([place]) 01/31/2018   Hyperglycemia 01/31/2018   Left leg cellulitis 09/24/2017   Syncope and collapse    Near syncope 11/23/2016   Abdominal pain 09/23/2016   Anxiety 09/23/2016   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[place]) 09/23/2016   Constipation 09/23/2016   DDD (degenerative disc disease), lumbosacral 09/23/2016   Migraines 09/23/2016   Myelopathy ([place]) 09/23/2016   Nausea 09/23/2016   Post laminectomy syndrome 09/23/2016   Pseudoarthrosis of lumbar spine 09/23/2016   Shortness of breath 09/23/2016   Sleep apnea 09/23/2016   Spinal stenosis of  lumbar region 09/23/2016   Vertigo 09/23/2016   Hallux malleus 11/15/2015   Chronic pain syndrome    GERD (gastroesophageal reflux disease)    Depression    Rash 11/14/2015   Toe ulcer, right ([place]) 04/11/2015   Cellulitis of toe of right foot 04/11/2015   Nonspecific chest pain    Chest pain 10/26/2014   Obesity (BMI 30-39.9) 10/26/2014   Hypokalemia 10/26/2014   Manic bipolar I disorder ([place]) 10/26/2014   Atypical angina 10/26/2014   Tardive dyskinesia 08/26/2011   Manic bipolar I disorder with rapid cycling ([place]) 05/18/2011    Class: Acute   Asthma 02/24/2011   Essential hypertension 02/24/2011   History of migraine headaches 02/24/2011   REFERRING DIAG: primary OA of left hip  THERAPY DIAG:  Pain in left hip  Stiffness of left hip, not elsewhere  classified  Muscle weakness (generalized)  Rationale for Evaluation and Treatment Rehabilitation  PERTINENT HISTORY: atypical angina, HTN, migraines, near syncope, GERD, myelopathy, bipolar disorder, chronic pain syndrome, 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 Hx multiple lumbar fusions, most recently about a month ago, ulcer R foot with current CAM boot  PRECAUTIONS: fall risk, lumbar fusion, CAM boot RLE, cardiac hx per chart (although pt denies any current issues)  SUBJECTIVE:                                                                                                                                                                                      SUBJECTIVE STATEMENT:  I've been doing the exercises. And I can do some of the sit to stands without my hands.   PAIN:  Are you having pain: 4/10 at rest, up to 6/10 with weightbearing.  Location: L hip  How would you describe your pain? sharp Best in past week: 0/10 Worst in past week: 8/10 (eases in about 54mn or so) Aggravating factors: bending, walking, lying flat, stairs Easing factors: rest, ice, medication (prescription)   OBJECTIVE: (objective measures completed at initial evaluation unless otherwise dated)   DIAGNOSTIC FINDINGS: no hip imaging in chart, pt states she had L hip MRI that showed arthritis and labral tear   PATIENT SURVEYS:  FOTO 40%   COGNITION: Overall cognitive status: Within functional limits for tasks assessed                         SENSATION: Chronic numbness/tingling in legs, light touch appears grossly intact on exam although not formally assessed given CAM boot   POSTURE: forward head, rounded shoulders, trunk lean to R     LOWER EXTREMITY ROM:   Active  Right eval Left  eval  Hip flexion      Hip extension      Hip internal rotation (seated) 22 16p!  Hip external rotation (seated) 38 28 mild pain   (Blank rows = not tested)   Comments:  concordant pain w/ IR on L. PROM  grossly comparable to AROM, limited by pain/stiffness   LOWER EXTREMITY MMT:     MMT Right eval Left eval  Hip flexion 4 3+ p!  Hip abduction (modified sitting) 4+ 4+  Hip internal rotation      Hip external rotation      Knee flexion 4+ 4- p!   Knee extension 4- 4- hip pain   (Blank rows = not tested)   Comments:    FUNCTIONAL TESTS:  5xSTS: 12sec w/ UE support, increasing pain with repetition, standard chair, reduced velocity with repetition   GAIT: Distance walked: within clinic Assistive device utilized: Quad cane small base, CAM boot RLE Level of assistance: Modified independence Comments: reduced gait speed/cadence, increased lateral weight shifting, reduced step length on R     TODAY'S TREATMENT:                                                                                                                               [facility] Adult PT Treatment:                                                DATE: 03/26/22 Therapeutic Exercise: Seated March 10 x 2  Seated clam red 10 x 2  Seated LAQ LT red band 10 x 2  Seated knee flexion Red band 10 x 2  Seated Ball squeeze 10 x 2 Nustep L3 x 6 mintes  STS 10 x 2  Supine piriformis stretch 15 sec x 4 , push and pull Bridge 10 x 2     [facility] Adult PT Treatment:                                                DATE: 03/24/22 Therapeutic Exercise: Seated March 10 x 2 each Seated Red band clam 10 x 2  Seated LAQ Left 10 x 2  Seated heel slide with cloth on floor 10 x 2  Nustep L3 LE only x 5 mintes  STS from elevated mat x 5 with UE, x 5 without UE STS x 5 from standard chair, without UE x 5    [facility] Adult PT Treatment:  DATE: 03/05/22 Therapeutic Exercise: Seated march x10 LLE, cues for HEP and form Adductor isos x10, cues for HEP setup and form     PATIENT EDUCATION:  Education details: Pt education on PT impairments, prognosis, and POC. Informed consent. Rationale for  interventions, safe/appropriate HEP performance Person educated: Patient Education method: Explanation, Demonstration, Tactile cues, Verbal cues, and Handouts Education comprehension: verbalized understanding, returned demonstration, verbal cues required, tactile cues required, and needs further education     HOME EXERCISE PROGRAM: Access Code: [email or web address] URL: [email or web address] Date: 03/05/2022 Prepared by: Enis Slipper   Exercises - Seated March  - 1 x daily - 7 x weekly - 3 sets - 10 reps - Seated Hip Adduction Isometrics with Ball  - 1 x daily - 7 x weekly - 3 sets - 10 reps Added 03/24/22 - seated clam with resistance band  - 1 x daily - 7 x weekly - 3 sets - 10 reps - Seated Long Arc Quad  - 1 x daily - 7 x weekly - 2 sets - 10 reps - Seated Knee Flexion Extension AROM   - 1 x daily - 7 x weekly - 2 sets - 10 reps - Sit to stand with control  - 1 x daily - 7 x weekly - 2 sets - 5 reps Added 03/26/22 - Cross one leg over: Push and pull knee   - 1 x daily - 7 x weekly - 1 sets - 3-4 reps - 10-15 hold - Beginner Bridge  - 1 x daily - 7 x weekly - 2 sets - 10 reps - 5 hold   ASSESSMENT:   CLINICAL IMPRESSION: Pt is a 56 y.o woman who arrives to PT session on this date for treatment of L hip pain. She arrives with [facility], antalgic gait and in a right Cam boot. She reports compliance with HEP including STS. She does have pain with these however is pleased with her ability to rise without UE assist. Today she was able to complete increased reps and sets and tolerated the addition of resistance to knee flexion and extension. Began piriformis stretching with min increased hip pain. She reported feeling fine at end of session.  Her HEP was updated. No change in objective measures as this is her first treatment week. She will benefit from continued skilled PT to address aforementioned deficits and maximize functional independence, minimize fall risk   OBJECTIVE IMPAIRMENTS: Abnormal  gait, decreased activity tolerance, decreased balance, decreased endurance, decreased mobility, difficulty walking, decreased ROM, decreased strength, and pain.    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    REHAB POTENTIAL: Fair given chronicity and comorbidities   CLINICAL DECISION MAKING: Evolving/moderate complexity   EVALUATION COMPLEXITY: Moderate     GOALS: Goals reviewed with patient? Yes   SHORT TERM GOALS: Target date: 04/02/2022  Pt will demonstrate appropriate understanding and performance of initially prescribed HEP in order to facilitate improved independence with management of symptoms.  Baseline: HEP provided on eval Goal status: INITIAL    2. Pt will score greater than or equal to 46% on FOTO in order to demonstrate improved perception of function due to symptoms.  Baseline: 40%            Goal status: INITIAL    LONG TERM GOALS: Target date: 04/30/2022    Pt will score 52% or greater on FOTO in order to demonstrate improved perception of function due to symptoms.  Baseline: 40% Goal status: INITIAL   2.  Pt will demonstrate at least 20 degrees of L hip IR AROM in order to facilitate improved tolerance/mechanics with functional mobility.  Baseline: see ROM chart above Goal status: INITIAL   3.  Pt will demonstrate grossly symmetrical hip MMT to facilitate improved functional strength.   Baseline: see MMT chart above Goal status: INITIAL   4.  Pt will be able to perform 5xSTS in less than or equal to 10 sec w/o UE support in order to demonstrate reduced fall risk  and improved functional independence (MCID 5xSTS = 2.3 sec). Baseline: 12sec heavy UE use standard chair Goal status: INITIAL   5. Pt will report ability to walk/stand up to 30 min with less than 4/10 pain on NPS in order to facilitate improved tolerance to household tasks.             Baseline: up to 8/10 with household tasks            Goal status: INITIAL   PLAN:   PT FREQUENCY: 1-2x/week   PT DURATION: 8 weeks   PLANNED INTERVENTIONS: Therapeutic exercises, Therapeutic activity, Neuromuscular re-education, Balance training, Gait training, Patient/Family education, Self Care, Joint mobilization, Stair training, Aquatic Therapy, Cryotherapy, Moist heat, Taping, Manual therapy, and Re-evaluation   PLAN FOR NEXT SESSION: Progress ROM/strengthening exercises as able/appropriate, review HEP.      Hessie Diener, PTA 03/26/22 8:56 AM Phone: [phone] Fax: [phone]

## 2022-03-29 ENCOUNTER — Encounter: Payer: Self-pay | Admitting: *Deleted

## 2022-03-29 ENCOUNTER — Telehealth: Payer: Self-pay | Admitting: *Deleted

## 2022-03-29 NOTE — Patient Instructions (Signed)
Visit Information  Thank you for taking time to visit with me today. Please don't hesitate to contact me if I can be of assistance to you.   Following are the goals we discussed today:   Goals Addressed             This Visit's Progress    COMPLETED: Care Coordination Activities   On track    Care Coordination Interventions: Evaluation of current treatment plan related to chronic neuropathy/ pain with chronic bilateral foot ulcers and patient's adherence to plan as established by provider Advised patient to continue taking antibiotics and to attend scheduled provider office visits with PCP and podiatry for follow up  Provided education to patient re: signs/ symptoms infection/ sepsis/ osteomyelitis along with corresponding action plan- reinforced previously provided education and obtained/ updated clinical condition per patient report today  Reviewed scheduled/upcoming provider appointments including PCP 04/01/22- post ED visit follow up; Podiatry 04/05/22- routine/ regular follow up on chronic bilateral foot/ toe ulcerations Discussed plans with patient for ongoing care management follow up and provided patient with direct contact information for care management team Advised patient to discuss her concerns with possible (L) toe ulcer worsening and to inform him of her recent (R) leg swelling that required ED visit on 03/15/22  with provider Completed additional assessment of patient's stated concerns from previous ED TOC calls last week--- patient denies need for further/ ongoing care coordination needs; reviewed/ reinforced reasons that patient should seek urgent/ emergent care; confirmed patient has my direct contact number and encouraged her to promptly contact me should additional concerns arise in setting of chronic bilateral foot wound status and made patient aware of options for additional care needs if necessary- patient declines needs today and assures me she will contact me should  needs arise;           If you are experiencing a Mental Health or [place] or need someone to talk to, please call the Suicide and Crisis Lifeline: 988 call the [place] National Suicide Prevention Lifeline: (902)651-9441 or TTY: (269)041-3435 TTY (620) 178-5848) to talk to a trained counselor call 1-800-273-TALK (toll free, 24 hour hotline) go to [facility] [address], [place] [phone]) call the [place]: [phone] call 911   Patient verbalizes understanding of instructions and care plan provided today and agrees to view in [place]. Active MyChart status and patient understanding of how to access instructions and care plan via MyChart confirmed with patient.     No further follow up required: patient declines need for ongoing/ further care coordination, has my direct contact information should needs arise in the future  Oneta Rack, RN, BSN, [place] Coordination/ [facility] [place] Management [phone]: direct office

## 2022-03-29 NOTE — Patient Outreach (Signed)
  Care Coordination   Follow Up Visit Note   03/29/2022 Name: [name] [name] MRN: [id number] DOB: 01-31-1966  [name] [name] is a 56 y.o. year old female who sees Pickard, Cammie Mcgee, MD for primary care. I spoke with  [name] [name] by phone today.  What matters to the patients health and wellness today?  "Things are going better now.... I did as you advised and called [name] [name] last week and he did call in an antibiotic for me; I got it and am taking it.  The right leg is still the same as it was when I left the ER visit- it is not better and it is not worse; I will look forward to seeing [name] [name] this week and have him look at it.  I think I have everything under control now, but if I have any more problems or concerns, I will call you, I have your phone number"  Follow up care coordination interventions provided; patient declines further/ ongoing care coordination needs and confirms she will contact me if needs or concerns arise in the future   Goals Addressed             This Visit's Progress    COMPLETED: Care Coordination Activities   On track    Care Coordination Interventions: Evaluation of current treatment plan related to chronic neuropathy/ pain with chronic bilateral foot ulcers and patient's adherence to plan as established by provider Advised patient to continue taking antibiotics and to attend scheduled provider office visits with PCP and podiatry for follow up  Provided education to patient re: signs/ symptoms infection/ sepsis/ osteomyelitis along with corresponding action plan- reinforced previously provided education and obtained/ updated clinical condition per patient report today  Reviewed scheduled/upcoming provider appointments including PCP 04/01/22- post ED visit follow up; Podiatry 04/05/22- routine/ regular follow up on chronic bilateral foot/ toe ulcerations Discussed plans with patient for ongoing care management follow up and provided patient with  direct contact information for care management team Advised patient to discuss her concerns with possible (L) toe ulcer worsening and to inform him of her recent (R) leg swelling that required ED visit on 03/15/22  with provider Completed additional assessment of patient's stated concerns from previous ED TOC calls last week--- patient denies need for further/ ongoing care coordination needs; reviewed/ reinforced reasons that patient should seek urgent/ emergent care; confirmed patient has my direct contact number and encouraged her to promptly contact me should additional concerns arise in setting of chronic bilateral foot wound status and made patient aware of options for additional care needs if necessary- patient declines needs today and assures me she will contact me should needs arise;            SDOH assessments and interventions completed:  No Previously completed    Care Coordination Interventions:  Yes, provided   Follow up plan: No further intervention required.   Encounter Outcome:  Pt. Visit Completed   [name] Rack, RN, BSN, CCRN Alumnus RN CM Care Coordination/ [facility] of [place] Management [phone]: direct office

## 2022-03-31 ENCOUNTER — Encounter: Payer: Self-pay | Admitting: Physical Therapy

## 2022-03-31 ENCOUNTER — Ambulatory Visit: Payer: Medicare Other | Admitting: Physical Therapy

## 2022-03-31 DIAGNOSIS — M25552 Pain in left hip: Secondary | ICD-10-CM

## 2022-03-31 DIAGNOSIS — M6281 Muscle weakness (generalized): Secondary | ICD-10-CM | POA: Diagnosis not present

## 2022-03-31 DIAGNOSIS — R2689 Other abnormalities of gait and mobility: Secondary | ICD-10-CM

## 2022-03-31 DIAGNOSIS — M25652 Stiffness of left hip, not elsewhere classified: Secondary | ICD-10-CM

## 2022-03-31 NOTE — Therapy (Signed)
OUTPATIENT PHYSICAL THERAPY TREATMENT NOTE   Patient Name: [name] [name] MRN: [id number] DOB:04/05/1966, 56 y.o., female Today's Date: 03/31/2022  PCP: [name], MD   REFERRING PROVIDER: Harrel Carina, MD  END OF SESSION:   PT End of Session - 03/31/22 0804     Visit Number 4    Number of Visits 17    Date for PT Re-Evaluation 04/30/22    Authorization Type [employer] medicare/medicaid    Authorization Time Period no auth, FOTO v6 v10, kx 15    PT Start Time 0803    PT Stop Time 0850    PT Time Calculation (min) 47 min             Past Medical History:  Diagnosis Date   Anxiety    Arthritis    Phreesia 02/08/2020   Asthma    mild intermittent   Asthma    Phreesia 02/08/2020   Bipolar 1 disorder ([place])    ect treatments last treatment Sep 02 1011   Depression    Depression    Phreesia 02/08/2020   Depression    Phreesia 07/10/2020   GERD (gastroesophageal reflux disease)    Hypertension    Pre-diabetes    Seizures ([place])    last seizure was so long ago she can't remember.    Sleep apnea    wears CPAP, uncertain of setting   Substance abuse ([place])    Phreesia 02/08/2020   Past Surgical History:  Procedure Laterality Date   ABDOMINAL HYSTERECTOMY     AMPUTATION TOE Left 02/02/2018   Procedure: AMPUTATION TOE Left 4th toe;  Surgeon: [name], DPM;  Location: [place];  Service: Podiatry;  Laterality: Left;   APPENDECTOMY N/A    Phreesia 02/08/2020   BACK SURGERY     CARPAL TUNNEL RELEASE     x2   IRRIGATION AND DEBRIDEMENT ABSCESS Right 10/25/2020   Procedure: IRRIGATION AND DEBRIDEMENT ABSCESS OF FOOT AND APPLICATION OF GRAFT;  Surgeon: [name], DPM;  Location: [place];  Service: Podiatry;  Laterality: Right;   Laproscopic knee surgery     NECK SURGERY  03/15/2017   PLANTAR FASCIA RELEASE     x2   SPINE SURGERY N/A    Phreesia 02/08/2020   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   Procedure: TRANSMETATARSAL AMPUTATION;   Surgeon: [name], DPM;  Location: [facility];  Service: Podiatry;  Laterality: Right;   Patient Active Problem List   Diagnosis Date Noted   Diarrhea in adult patient    Weakness 06/30/2021   Gastroenteritis, infectious 06/30/2021   AKI (acute kidney injury) ([place]) 06/30/2021   Hyponatremia 06/30/2021   Open wound of right foot, initial encounter 10/21/2020   Finger ulcer ([place]) 10/21/2020   Syncope 08/26/2019   Non-healing open wound of toe 05/15/2019   Osteomyelitis ([place]) 05/15/2019   Palpitations 10/26/2018   Foot ulcer ([place]) 01/31/2018   Hyperglycemia 01/31/2018   Left leg cellulitis 09/24/2017   Syncope and collapse    Near syncope 11/23/2016   Abdominal pain 09/23/2016   Anxiety 09/23/2016   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[place]) 09/23/2016   Constipation 09/23/2016   DDD (degenerative disc disease), lumbosacral 09/23/2016   Migraines 09/23/2016   Myelopathy ([place]) 09/23/2016   Nausea 09/23/2016   Post laminectomy syndrome 09/23/2016   Pseudoarthrosis of lumbar spine 09/23/2016   Shortness of breath 09/23/2016   Sleep apnea 09/23/2016   Spinal stenosis of lumbar region 09/23/2016   Vertigo 09/23/2016  Hallux malleus 11/15/2015   Chronic pain syndrome    GERD (gastroesophageal reflux disease)    Depression    Rash 11/14/2015   Toe ulcer, right ([place]) 04/11/2015   Cellulitis of toe of right foot 04/11/2015   Nonspecific chest pain    Chest pain 10/26/2014   Obesity (BMI 30-39.9) 10/26/2014   Hypokalemia 10/26/2014   Manic bipolar I disorder ([place]) 10/26/2014   Atypical angina 10/26/2014   Tardive dyskinesia 08/26/2011   Manic bipolar I disorder with rapid cycling ([place]) 05/18/2011    Class: Acute   Asthma 02/24/2011   Essential hypertension 02/24/2011   History of migraine headaches 02/24/2011   REFERRING DIAG: primary OA of left hip  THERAPY DIAG:  Pain in left hip  Stiffness of left hip, not elsewhere classified  Muscle weakness  (generalized)  Other abnormalities of gait and mobility  Rationale for Evaluation and Treatment Rehabilitation  PERTINENT HISTORY: atypical angina, HTN, migraines, near syncope, GERD, myelopathy, bipolar disorder, chronic pain syndrome, 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 Hx multiple lumbar fusions, most recently about a month ago, ulcer R foot with current CAM boot  PRECAUTIONS: fall risk, lumbar fusion, CAM boot RLE, cardiac hx per chart (although pt denies any current issues)  SUBJECTIVE:                                                                                                                                                                                      SUBJECTIVE STATEMENT:  I am in more pain. It started 2 days ago. Not sure if the PT is causing more pain.    PAIN:  Are you having pain: up to 8-9/10 with weightbearing.  Location: L hip  How would you describe your pain? sharp Best in past week: 0/10 Worst in past week: 8/10 (eases in about 105mn or so) Aggravating factors: bending, walking, lying flat, stairs Easing factors: rest, ice, medication (prescription)   OBJECTIVE: (objective measures completed at initial evaluation unless otherwise dated)   DIAGNOSTIC FINDINGS: no hip imaging in chart, pt states she had L hip MRI that showed arthritis and labral tear   PATIENT SURVEYS:  FOTO 40%   COGNITION: Overall cognitive status: Within functional limits for tasks assessed                         SENSATION: Chronic numbness/tingling in legs, light touch appears grossly intact on exam although not formally assessed given CAM boot   POSTURE: forward head, rounded shoulders, trunk lean to R     LOWER EXTREMITY ROM:   Active  Right eval Left eval  Hip  flexion      Hip extension      Hip internal rotation (seated) 22 16p!  Hip external rotation (seated) 38 28 mild pain   (Blank rows = not tested)   Comments:  concordant pain w/ IR on L. PROM  grossly comparable to AROM, limited by pain/stiffness   LOWER EXTREMITY MMT:     MMT Right eval Left eval  Hip flexion 4 3+ p!  Hip abduction (modified sitting) 4+ 4+  Hip internal rotation      Hip external rotation      Knee flexion 4+ 4- p!   Knee extension 4- 4- hip pain   (Blank rows = not tested)   Comments:    FUNCTIONAL TESTS:  5xSTS: 12sec w/ UE support, increasing pain with repetition, standard chair, reduced velocity with repetition   GAIT: Distance walked: within clinic Assistive device utilized: Quad cane small base, CAM boot RLE Level of assistance: Modified independence Comments: reduced gait speed/cadence, increased lateral weight shifting, reduced step length on R     TODAY'S TREATMENT:                                                                                                                              [facility] Adult PT Treatment:                                                DATE: 03/31/22 Therapeutic Exercise: Nustep L2 LE only x 5 minutes  LAQ 10 x 2  March 10  x 2  Seated clam red band 10 x 2  Seated heel slides with cloth Left 10 x 2  Supine clam AROM Supine Marching  Supine PPT 10 x 2  Supine Bridge 10 x 2  Wide set LTR x 10  Self Care: discussed pain management strategies including ice, hmp , she is also getting a new TENS unit Modalities: HMP to anterior left hip/groin x15 min    [facility] Adult PT Treatment:                                                DATE: 03/26/22 Therapeutic Exercise: Seated March 10 x 2  Seated clam red 10 x 2  Seated LAQ LT red band 10 x 2  Seated knee flexion Red band 10 x 2  Seated Ball squeeze 10 x 2 Nustep L3 x 6 mintes  STS 10 x 2  Supine piriformis stretch 15 sec x 4 , push and pull Bridge 10 x 2     [facility] Adult PT Treatment:  DATE: 03/24/22 Therapeutic Exercise: Seated March 10 x 2 each Seated Red band clam 10 x 2  Seated LAQ Left 10 x 2  Seated heel  slide with cloth on floor 10 x 2  Nustep L3 LE only x 5 mintes  STS from elevated mat x 5 with UE, x 5 without UE STS x 5 from standard chair, without UE x 5    [facility] Adult PT Treatment:                                                DATE: 03/05/22 Therapeutic Exercise: Seated march x10 LLE, cues for HEP and form Adductor isos x10, cues for HEP setup and form     PATIENT EDUCATION:  Education details: Pt education on PT impairments, prognosis, and POC. Informed consent. Rationale for interventions, safe/appropriate HEP performance Person educated: Patient Education method: Explanation, Demonstration, Tactile cues, Verbal cues, and Handouts Education comprehension: verbalized understanding, returned demonstration, verbal cues required, tactile cues required, and needs further education     HOME EXERCISE PROGRAM: Access Code: [email or web address] URL: [email or web address] Date: 03/05/2022 Prepared by: Enis Slipper   Exercises - Seated March  - 1 x daily - 7 x weekly - 3 sets - 10 reps - Seated Hip Adduction Isometrics with Ball  - 1 x daily - 7 x weekly - 3 sets - 10 reps Added 03/24/22 - seated clam with resistance band  - 1 x daily - 7 x weekly - 3 sets - 10 reps - Seated Long Arc Quad  - 1 x daily - 7 x weekly - 2 sets - 10 reps - Seated Knee Flexion Extension AROM   - 1 x daily - 7 x weekly - 2 sets - 10 reps - Sit to stand with control  - 1 x daily - 7 x weekly - 2 sets - 5 reps Added 03/26/22 - Cross one leg over: Push and pull knee   - 1 x daily - 7 x weekly - 1 sets - 3-4 reps - 10-15 hold - Beginner Bridge  - 1 x daily - 7 x weekly - 2 sets - 10 reps - 5 hold   ASSESSMENT:   CLINICAL IMPRESSION: Pt is a 56 y.o woman who arrives to PT session on this date for treatment of L hip pain. She arrives with [facility], antalgic gait and in a right Cam boot. She reports increased pain starting 2 days ago located in the left anterior hip/groin area. Pain is 8-9/10. Discussed pain  management strategies including ice or heat applications. She was also encouraged to call MD regarding increased hip pain to see if she qualifies for a cortisone injection. She still plans to have her shoulder replacement later this month.  Session spent with AROM only, due to increased pain. She was able to perform all prescribed therex. Trial of HMP at end of sessio. She will benefit from continued skilled PT to address aforementioned deficits and maximize functional independence, minimize fall risk   OBJECTIVE IMPAIRMENTS: Abnormal gait, decreased activity tolerance, decreased balance, decreased endurance, decreased mobility, difficulty walking, decreased ROM, decreased strength, and pain.    ACTIVITY LIMITATIONS: carrying, lifting, bending, standing, squatting, stairs, transfers, and locomotion level   PARTICIPATION LIMITATIONS: cleaning, laundry, and community activity   PERSONAL FACTORS: Time since onset of injury/illness/exacerbation and 3+ comorbidities: atypical angina, HTN, myelopathy, bipolar  disorder, chronic pain syndrome, 2021 R TMA, L digit 4 amputation 2019  are also affecting patient's functional outcome.    REHAB POTENTIAL: Fair given chronicity and comorbidities   CLINICAL DECISION MAKING: Evolving/moderate complexity   EVALUATION COMPLEXITY: Moderate     GOALS: Goals reviewed with patient? Yes   SHORT TERM GOALS: Target date: 04/02/2022  Pt will demonstrate appropriate understanding and performance of initially prescribed HEP in order to facilitate improved independence with management of symptoms.  Baseline: HEP provided on eval Goal status: MET   2. Pt will score greater than or equal to 46% on FOTO in order to demonstrate improved perception of function due to symptoms.            Baseline: 40%            Goal status: ONGOING   LONG TERM GOALS: Target date: 04/30/2022    Pt will score 52% or greater on FOTO in order to demonstrate improved perception of function  due to symptoms.  Baseline: 40% Goal status: INITIAL   2.  Pt will demonstrate at least 20 degrees of L hip IR AROM in order to facilitate improved tolerance/mechanics with functional mobility.  Baseline: see ROM chart above Goal status: INITIAL   3.  Pt will demonstrate grossly symmetrical hip MMT to facilitate improved functional strength.   Baseline: see MMT chart above Goal status: INITIAL   4.  Pt will be able to perform 5xSTS in less than or equal to 10 sec w/o UE support in order to demonstrate reduced fall risk and improved functional independence (MCID 5xSTS = 2.3 sec). Baseline: 12sec heavy UE use standard chair Goal status: INITIAL   5. Pt will report ability to walk/stand up to 30 min with less than 4/10 pain on NPS in order to facilitate improved tolerance to household tasks.             Baseline: up to 8/10 with household tasks            Goal status: INITIAL   PLAN:   PT FREQUENCY: 1-2x/week   PT DURATION: 8 weeks   PLANNED INTERVENTIONS: Therapeutic exercises, Therapeutic activity, Neuromuscular re-education, Balance training, Gait training, Patient/Family education, Self Care, Joint mobilization, Stair training, Aquatic Therapy, Cryotherapy, Moist heat, Taping, Manual therapy, and Re-evaluation   PLAN FOR NEXT SESSION: Progress ROM/strengthening exercises as able/appropriate, review HEP.      Hessie Diener, PTA 03/31/22 8:45 AM Phone: [phone] Fax: [phone]

## 2022-04-01 ENCOUNTER — Ambulatory Visit (INDEPENDENT_AMBULATORY_CARE_PROVIDER_SITE_OTHER): Payer: Medicare Other | Admitting: Family Medicine

## 2022-04-01 VITALS — BP 128/72 | HR 77 | Ht 64.0 in | Wt 245.2 lb

## 2022-04-01 DIAGNOSIS — M7121 Synovial cyst of popliteal space [Baker], right knee: Secondary | ICD-10-CM

## 2022-04-01 NOTE — Care Plan (Signed)
Ortho Bundle Case Management Note  Patient Details  Name: [name] [name] MRN: [id number] Date of Birth: 12-25-1965  Patient will discharge to home with family to assist. No DME needed. OPPT ser up with [place]. Discharge instructions discussed with patient. Questions answere. Patient and MD in agreement with plan. CHoice offered                     DME Arranged:    DME Agency:     HH Arranged:    HH Agency:     Additional Comments: Please contact me with any questions of if this plan should need to change.  Ladell Heads,  [facility]  [phone] 04/01/2022, 12:09 PM

## 2022-04-01 NOTE — Progress Notes (Signed)
Subjective:    Patient ID: [name] [name], female    DOB: 09/01/1965, 56 y.o.   MRN: [id number]  HPI Patient was recently seen in the emergency room due to swelling in her right leg.  Patient has a history of transmetatarsal amputation of the right foot and is currently in an cam walker.  She was in the hospital on November 27 due to swelling in her right leg distal to her knee.  Lab work included a normal CBC, negative D-dimer, normal CMP, patient had a venous ultrasound to rule out a DVT.  This was significant for a 4 cm x 1 cm cystic structure in the popliteal fossa of the right leg.  Past Medical History:  Diagnosis Date   Anxiety    Arthritis    Phreesia 02/08/2020   Asthma    mild intermittent   Asthma    Phreesia 02/08/2020   Bipolar 1 disorder ([place])    ect treatments last treatment Sep 02 1011   Depression    Depression    Phreesia 02/08/2020   Depression    Phreesia 07/10/2020   GERD (gastroesophageal reflux disease)    Hypertension    Pre-diabetes    Seizures ([place])    last seizure was so long ago she can't remember.    Sleep apnea    wears CPAP, uncertain of setting   Substance abuse ([place])    Phreesia 02/08/2020   Past Surgical History:  Procedure Laterality Date   ABDOMINAL HYSTERECTOMY     AMPUTATION TOE Left 02/02/2018   Procedure: AMPUTATION TOE Left 4th toe;  Surgeon: [name], DPM;  Location: [facility];  Service: Podiatry;  Laterality: Left;   APPENDECTOMY N/A    Phreesia 02/08/2020   BACK SURGERY     CARPAL TUNNEL RELEASE     x2   IRRIGATION AND DEBRIDEMENT ABSCESS Right 10/25/2020   Procedure: IRRIGATION AND DEBRIDEMENT ABSCESS OF FOOT AND APPLICATION OF GRAFT;  Surgeon: [name], DPM;  Location: [facility];  Service: Podiatry;  Laterality: Right;   Laproscopic knee surgery     NECK SURGERY  03/15/2017   PLANTAR FASCIA RELEASE     x2   SPINE SURGERY N/A    Phreesia 02/08/2020   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   Procedure:  TRANSMETATARSAL AMPUTATION;  Surgeon: [name], DPM;  Location[facility];  Service: Podiatry;  Laterality: Right;   Current Outpatient Medications on File Prior to Visit  Medication Sig Dispense Refill   albuterol (PROAIR HFA) 108 (90 Base) MCG/ACT inhaler Inhale 2 puffs = 156mg into the lungs every 6 (six) hours as needed for wheezing or shortness of breath. 8.5 g 3   busPIRone (BUSPAR) 15 MG tablet Take 15 mg by mouth 3 (three) times daily.   2   Calcium Carbonate-Vit D-Min (CALCIUM 1200 PO) Take 1,200 mg by mouth daily.      clindamycin (CLEOCIN) 300 MG capsule Take 1 capsule (300 mg total) by mouth 3 (three) times daily. 21 capsule 0   cyclobenzaprine (FLEXERIL) 10 MG tablet Take 10 mg by mouth 2 (two) times daily as needed for muscle spasms.      DULoxetine (CYMBALTA) 60 MG capsule TAKE 1 CAPSULE BY MOUTH EVERY DAY 90 capsule 3   Erenumab-aooe (AIMOVIG) 70 MG/ML SOAJ administer 167mUNDER THE SKIN every 30 DAYS 1 mL 5   escitalopram (LEXAPRO) 10 MG tablet Take 10 mg by mouth daily.     estradiol (ESTRACE) 1 MG tablet  Take 1 tablet (1 mg total) by mouth at bedtime. 90 tablet 3   famotidine (PEPCID) 40 MG tablet Take 1 tablet (40 mg total) by mouth at bedtime. 90 tablet 3   fluticasone (FLONASE) 50 MCG/ACT nasal spray SPRAY 2 SPRAYS INTRO EACH NOSTRIL EVERY DAY (Patient taking differently: Place 2 sprays into both nostrils daily as needed for allergies.) 48 g 1   haloperidol (HALDOL) 5 MG tablet Take 1 tablet (5 mg total) by mouth at bedtime. For psychosis     lamoTRIgine (LAMICTAL) 25 MG tablet Take by mouth.     levocetirizine (XYZAL) 5 MG tablet Take 1 tablet (5 mg total) by mouth every evening. 90 tablet 3   LINZESS 290 MCG CAPS capsule TAKE ONE CAPSULE BY MOUTH EVERY MORNING 90 capsule 0   loperamide (IMODIUM) 2 MG capsule Take 2 mg by mouth 4 (four) times daily as needed.     meloxicam (MOBIC) 15 MG tablet Take 15 mg by mouth at bedtime.   2   metoprolol succinate (TOPROL-XL) 25  MG 24 hr tablet Take 1 tablet (25 mg total) by mouth daily. 90 tablet 3   MOVANTIK 25 MG TABS tablet 25 mg.     Multiple Vitamins-Minerals (MULTIVITAMIN WITH MINERALS) tablet Take 1 tablet by mouth daily.     naloxone (NARCAN) nasal spray 4 mg/0.1 mL      ondansetron (ZOFRAN) 4 MG tablet Take 1 tablet (4 mg total) by mouth every 8 (eight) hours as needed for nausea or vomiting. 20 tablet 0   OVER THE COUNTER MEDICATION Take 1 capsule by mouth daily. Fennel Seed     oxyCODONE-acetaminophen (PERCOCET) 7.5-325 MG tablet Take 1 tablet by mouth 6 (six) times daily.     pantoprazole (PROTONIX) 40 MG tablet Take 1 tablet (40 mg total) by mouth 2 (two) times daily. 180 tablet 3   prazosin (MINIPRESS) 2 MG capsule Take 2 mg by mouth at bedtime.     pregabalin (LYRICA) 100 MG capsule Take 1 capsule (100 mg total) by mouth 2 (two) times daily. 90 capsule 3   promethazine (PHENERGAN) 25 MG tablet Take 1 tablet (25 mg total) by mouth every 8 (eight) hours as needed for nausea or vomiting. 20 tablet 0   solifenacin (VESICARE) 10 MG tablet Take 1 tablet (10 mg total) by mouth daily. 90 tablet 0   spironolactone (ALDACTONE) 50 MG tablet TAKE ONE TABLET BY MOUTH DAILY 30 tablet 1   sulfamethoxazole-trimethoprim (BACTRIM DS) 800-160 MG tablet Take 1 tablet by mouth 2 (two) times daily. 14 tablet 0   sulfamethoxazole-trimethoprim (BACTRIM DS) 800-160 MG tablet Take 1 tablet by mouth 2 (two) times daily. 20 tablet 0   SUMAtriptan (IMITREX) 50 MG tablet TAKE 1 TAB EVERY 2 HOURS AS NEEDED FOR MIGRAINE. MAY REPEAT IN 2 HOURS IF PERSISTS OR RECURS (Patient taking differently: Take 50 mg by mouth every 2 (two) hours as needed for migraine.) 10 tablet 3   topiramate (TOPAMAX) 50 MG tablet Take 1 tablet (50 mg total) by mouth 2 (two) times daily. 180 tablet 2   traZODone (DESYREL) 100 MG tablet Take 200 mg by mouth at bedtime.  0   No current facility-administered medications on file prior to visit.   Allergies  Allergen  Reactions   Other    Penicillins Hives and Rash    No anaphylactic or severe cutaneous Sx > 10 yr ago, no additional medical attention required Tolerates Keflex and Rocephin   Tetracyclines & Related Other (See  Comments)    Syncope and put her "in a coma"   Tramadol Other (See Comments)    Seizures   Phenazopyridine Other (See Comments)    Unknown   Tetracycline Other (See Comments)    Syncope and "Put me in a coma"   Ciprofloxacin Rash and Itching   Codeine Itching and Rash   Estradiol Rash    Patches broke out the skin   Social History   Socioeconomic History   Marital status: Widowed    Spouse name: Not on file   Number of children: 3   Years of education: 21   Highest education level: Not on file  Occupational History   Occupation: Disabled  Tobacco Use   Smoking status: Never   Smokeless tobacco: Never  Vaping Use   Vaping Use: Never used  Substance and Sexual Activity   Alcohol use: No   Drug use: No   Sexual activity: Not on file  Other Topics Concern   Not on file  Social History Narrative   Patient drink 4 16oz caffeine drinks a day    Social Determinants of Health   Financial Resource Strain: Low Risk  (08/21/2020)   Overall Financial Resource Strain (CARDIA)    Difficulty of Paying Living Expenses: Not hard at all  Food Insecurity: No Food Insecurity (03/23/2022)   Hunger Vital Sign    Worried About Running Out of Food in the Last Year: Never true    [place] in the Last Year: Never true  Transportation Needs: No Transportation Needs (03/23/2022)   PRAPARE - Hydrologist (Medical): No    Lack of Transportation (Non-Medical): No  Physical Activity: Inactive (08/21/2020)   Exercise Vital Sign    Days of Exercise per Week: 0 days    Minutes of Exercise per Session: 0 min  Stress: No Stress Concern Present (08/21/2020)   Gays    Feeling of Stress :  Not at all  Social Connections: Socially Isolated (08/21/2020)   Social Connection and Isolation Panel [NHANES]    Frequency of Communication with Friends and Family: More than three times a week    Frequency of Social Gatherings with Friends and Family: Twice a week    Attends Religious Services: Never    Marine scientist or Organizations: No    Attends Archivist Meetings: Never    Marital Status: Widowed  Intimate Partner Violence: Not At Risk (08/21/2020)   Humiliation, Afraid, Rape, and Kick questionnaire    Fear of Current or Ex-Partner: No    Emotionally Abused: No    Physically Abused: No    Sexually Abused: No   Family History  Problem Relation Age of Onset   Diabetes Mother    COPD Mother    Hypertension Mother    Hyperlipidemia Mother    Sleep apnea Mother    Heart disease Father    Hyperlipidemia Father    Hypertension Father    Cancer Father    Sleep apnea Father    Heart disease Brother    Sleep apnea Brother    Heart disease Maternal Grandmother    Hypertension Maternal Grandmother    Sleep apnea Maternal Grandmother    Heart disease Maternal Grandfather    Kidney cancer Paternal Grandmother    Heart disease Paternal Grandfather    Heart disease Daughter       Review of Systems  All other  systems reviewed and are negative.      Objective:   Physical Exam Vitals reviewed.  Constitutional:      General: She is not in acute distress.    Appearance: She is well-developed. She is not diaphoretic.  HENT:     Head: Normocephalic and atraumatic.     Right Ear: External ear normal.     Left Ear: External ear normal.     Nose: Nose normal.     Mouth/Throat:     Pharynx: No oropharyngeal exudate.  Eyes:     General: No scleral icterus.       Right eye: No discharge.        Left eye: No discharge.     Conjunctiva/sclera: Conjunctivae normal.     Pupils: Pupils are equal, round, and reactive to light.  Neck:     Thyroid: No thyromegaly.      Vascular: No JVD.     Trachea: No tracheal deviation.  Cardiovascular:     Rate and Rhythm: Normal rate and regular rhythm.     Heart sounds: Normal heart sounds. No murmur heard.    No friction rub. No gallop.  Pulmonary:     Effort: Pulmonary effort is normal. No respiratory distress.     Breath sounds: Normal breath sounds. No stridor. No wheezing or rales.  Chest:     Chest wall: No tenderness.  Abdominal:     General: Bowel sounds are normal. There is no distension.     Palpations: Abdomen is soft. There is no mass.     Tenderness: There is no abdominal tenderness. There is no guarding or rebound.  Musculoskeletal:        General: No tenderness. Normal range of motion.     Cervical back: Normal range of motion and neck supple.  Lymphadenopathy:     Cervical: No cervical adenopathy.  Skin:    General: Skin is warm.     Coloration: Skin is not pale.     Findings: No erythema or rash.  Neurological:     Mental Status: She is alert and oriented to person, place, and time.     Cranial Nerves: No cranial nerve deficit.     Motor: No abnormal muscle tone.     Coordination: Coordination normal.     Deep Tendon Reflexes: Reflexes are normal and symmetric.  Psychiatric:        Behavior: Behavior normal.        Thought Content: Thought content normal.        Judgment: Judgment normal.     On exam today there is some mild asymmetry right leg compared to the left leg.  The left calf is somewhat smaller than the right calf.  There are some mild pitting edema in the right leg slightly more than the left leg distal to the knee.  There is no erythema.  There is no warmth.  I examined the skin from the knee all the way to the plantar aspect of the foot.  There is no erythema or evidence of cellulitis.  There is adequate posterior tibialis and dorsal pedal pulses.  I am unable to appreciate any palpable mass in the popliteal fossa but ultrasound confirms a small cystic mass consistent  with a Baker's cyst      Assessment & Plan:  Synovial cyst of right popliteal space I believe the Baker's cyst is likely causing some of the swelling in the right leg possibly due to venous compression on the  venous return from the lower leg.  This is minimal.  There is no significant erythema or sign of infection.  There is no arterial compromise.  Therefore no treatment is necessary.  I believe the Baker's cyst is likely a direct result of the arthritis in her right knee

## 2022-04-01 NOTE — Therapy (Incomplete)
OUTPATIENT PHYSICAL THERAPY TREATMENT NOTE   Patient Name: [name] [name] MRN: [id number] DOB:05/14/1965, 56 y.o., female Today's Date: 04/01/2022  PCP: [name], MD   REFERRING PROVIDER: Harrel Carina, MD  END OF SESSION:     Past Medical History:  Diagnosis Date   Anxiety    Arthritis    Phreesia 02/08/2020   Asthma    mild intermittent   Asthma    Phreesia 02/08/2020   Bipolar 1 disorder ([place])    ect treatments last treatment Sep 02 1011   Depression    Depression    Phreesia 02/08/2020   Depression    Phreesia 07/10/2020   GERD (gastroesophageal reflux disease)    Hypertension    Pre-diabetes    Seizures ([place])    last seizure was so long ago she can't remember.    Sleep apnea    wears CPAP, uncertain of setting   Substance abuse ([place])    Phreesia 02/08/2020   Past Surgical History:  Procedure Laterality Date   ABDOMINAL HYSTERECTOMY     AMPUTATION TOE Left 02/02/2018   Procedure: AMPUTATION TOE Left 4th toe;  Surgeon: [name], DPM;  Location: [facility];  Service: Podiatry;  Laterality: Left;   APPENDECTOMY N/A    Phreesia 02/08/2020   BACK SURGERY     CARPAL TUNNEL RELEASE     x2   IRRIGATION AND DEBRIDEMENT ABSCESS Right 10/25/2020   Procedure: IRRIGATION AND DEBRIDEMENT ABSCESS OF FOOT AND APPLICATION OF GRAFT;  Surgeon: [name], DPM;  Location: [facility];  Service: Podiatry;  Laterality: Right;   Laproscopic knee surgery     NECK SURGERY  03/15/2017   PLANTAR FASCIA RELEASE     x2   SPINE SURGERY N/A    Phreesia 02/08/2020   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   Procedure: TRANSMETATARSAL AMPUTATION;  Surgeon: [name], DPM;  Location[facility];  Service: Podiatry;  Laterality: Right;   Patient Active Problem List   Diagnosis Date Noted   Diarrhea in adult patient    Weakness 06/30/2021   Gastroenteritis, infectious 06/30/2021   AKI (acute kidney injury) ([place]) 06/30/2021   Hyponatremia 06/30/2021   Open  wound of right foot, initial encounter 10/21/2020   Finger ulcer ([place]) 10/21/2020   Syncope 08/26/2019   Non-healing open wound of toe 05/15/2019   Osteomyelitis ([place]) 05/15/2019   Palpitations 10/26/2018   Foot ulcer ([place]) 01/31/2018   Hyperglycemia 01/31/2018   Left leg cellulitis 09/24/2017   Syncope and collapse    Near syncope 11/23/2016   Abdominal pain 09/23/2016   Anxiety 09/23/2016   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[place]) 09/23/2016   Constipation 09/23/2016   DDD (degenerative disc disease), lumbosacral 09/23/2016   Migraines 09/23/2016   Myelopathy ([place]) 09/23/2016   Nausea 09/23/2016   Post laminectomy syndrome 09/23/2016   Pseudoarthrosis of lumbar spine 09/23/2016   Shortness of breath 09/23/2016   Sleep apnea 09/23/2016   Spinal stenosis of lumbar region 09/23/2016   Vertigo 09/23/2016   Hallux malleus 11/15/2015   Chronic pain syndrome    GERD (gastroesophageal reflux disease)    Depression    Rash 11/14/2015   Toe ulcer, right ([place]) 04/11/2015   Cellulitis of toe of right foot 04/11/2015   Nonspecific chest pain    Chest pain 10/26/2014   Obesity (BMI 30-39.9) 10/26/2014   Hypokalemia 10/26/2014   Manic bipolar I disorder ([place]) 10/26/2014   Atypical angina 10/26/2014   Tardive dyskinesia 08/26/2011   Manic  bipolar I disorder with rapid cycling ([place]) 05/18/2011    Class: Acute   Asthma 02/24/2011   Essential hypertension 02/24/2011   History of migraine headaches 02/24/2011   REFERRING DIAG: primary OA of left hip  THERAPY DIAG:  No diagnosis found.  Rationale for Evaluation and Treatment Rehabilitation  PERTINENT HISTORY: atypical angina, HTN, migraines, near syncope, GERD, myelopathy, bipolar disorder, chronic pain syndrome, 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 Hx multiple lumbar fusions, most recently about a month ago, ulcer R foot with current CAM boot  PRECAUTIONS: fall risk, lumbar fusion, CAM boot RLE, cardiac hx per chart  (although pt denies any current issues)  SUBJECTIVE:                                                                                                                                                                                      SUBJECTIVE STATEMENT:  I am in more pain. It started 2 days ago. Not sure if the PT is causing more pain.  *** ***  PAIN:  Are you having pain: *** up to 8-9/10 with weightbearing.  Location: L hip  How would you describe your pain? sharp Best in past week: 0/10 Worst in past week: 8/10 (eases in about 16mn or so) Aggravating factors: bending, walking, lying flat, stairs Easing factors: rest, ice, medication (prescription)   OBJECTIVE: (objective measures completed at initial evaluation unless otherwise dated)   DIAGNOSTIC FINDINGS: no hip imaging in chart, pt states she had L hip MRI that showed arthritis and labral tear   PATIENT SURVEYS:  FOTO 40%   COGNITION: Overall cognitive status: Within functional limits for tasks assessed                         SENSATION: Chronic numbness/tingling in legs, light touch appears grossly intact on exam although not formally assessed given CAM boot   POSTURE: forward head, rounded shoulders, trunk lean to R     LOWER EXTREMITY ROM:   Active  Right eval Left eval  Hip flexion      Hip extension      Hip internal rotation (seated) 22 16p!  Hip external rotation (seated) 38 28 mild pain   (Blank rows = not tested)   Comments:  concordant pain w/ IR on L. PROM grossly comparable to AROM, limited by pain/stiffness   LOWER EXTREMITY MMT:     MMT Right eval Left eval  Hip flexion 4 3+ p!  Hip abduction (modified sitting) 4+ 4+  Hip internal rotation      Hip external rotation      Knee flexion 4+  4- p!   Knee extension 4- 4- hip pain   (Blank rows = not tested)   Comments:    FUNCTIONAL TESTS:  5xSTS: 12sec w/ UE support, increasing pain with repetition, standard chair, reduced velocity with  repetition   GAIT: Distance walked: within clinic Assistive device utilized: Quad cane small base, CAM boot RLE Level of assistance: Modified independence Comments: reduced gait speed/cadence, increased lateral weight shifting, reduced step length on R     TODAY'S TREATMENT:                                                                                                   [facility] Adult PT Treatment:                                                DATE: 04/02/22 Therapeutic Exercise: *** Manual Therapy: *** Neuromuscular re-ed: *** Therapeutic Activity: *** Modalities: *** Self Care: ***                             [facility] Adult PT Treatment:                                                DATE: 03/31/22 Therapeutic Exercise: Nustep L2 LE only x 5 minutes  LAQ 10 x 2  March 10  x 2  Seated clam red band 10 x 2  Seated heel slides with cloth Left 10 x 2  Supine clam AROM Supine Marching  Supine PPT 10 x 2  Supine Bridge 10 x 2  Wide set LTR x 10  Self Care: discussed pain management strategies including ice, hmp , she is also getting a new TENS unit Modalities: HMP to anterior left hip/groin x15 min    [facility] Adult PT Treatment:                                                DATE: 03/26/22 Therapeutic Exercise: Seated March 10 x 2  Seated clam red 10 x 2  Seated LAQ LT red band 10 x 2  Seated knee flexion Red band 10 x 2  Seated Ball squeeze 10 x 2 Nustep L3 x 6 mintes  STS 10 x 2  Supine piriformis stretch 15 sec x 4 , push and pull Bridge 10 x 2      PATIENT EDUCATION:  Education details: Pt education on PT impairments, prognosis, and POC. Informed consent. Rationale for interventions, safe/appropriate HEP performance Person educated: Patient Education method: Explanation, Demonstration, Tactile cues, Verbal cues, and Handouts Education comprehension: verbalized understanding, returned demonstration, verbal cues required, tactile cues required, and needs further education  HOME EXERCISE PROGRAM: Access Code: [email or web address] URL: [email or web address] Date: 03/05/2022 Prepared by: Enis Slipper   Exercises - Seated March  - 1 x daily - 7 x weekly - 3 sets - 10 reps - Seated Hip Adduction Isometrics with Ball  - 1 x daily - 7 x weekly - 3 sets - 10 reps Added 03/24/22 - seated clam with resistance band  - 1 x daily - 7 x weekly - 3 sets - 10 reps - Seated Long Arc Quad  - 1 x daily - 7 x weekly - 2 sets - 10 reps - Seated Knee Flexion Extension AROM   - 1 x daily - 7 x weekly - 2 sets - 10 reps - Sit to stand with control  - 1 x daily - 7 x weekly - 2 sets - 5 reps Added 03/26/22 - Cross one leg over: Push and pull knee   - 1 x daily - 7 x weekly - 1 sets - 3-4 reps - 10-15 hold - Beginner Bridge  - 1 x daily - 7 x weekly - 2 sets - 10 reps - 5 hold   ASSESSMENT:   CLINICAL IMPRESSION: ***  *** Pt is a 56 y.o woman who arrives to PT session on this date for treatment of L hip pain. She arrives with [facility], antalgic gait and in a right Cam boot. She reports increased pain starting 2 days ago located in the left anterior hip/groin area. Pain is 8-9/10. Discussed pain management strategies including ice or heat applications. She was also encouraged to call MD regarding increased hip pain to see if she qualifies for a cortisone injection. She still plans to have her shoulder replacement later this month.  Session spent with AROM only, due to increased pain. She was able to perform all prescribed therex. Trial of HMP at end of sessio. She will benefit from continued skilled PT to address aforementioned deficits and maximize functional independence, minimize fall risk   OBJECTIVE IMPAIRMENTS: Abnormal gait, decreased activity tolerance, decreased balance, decreased endurance, decreased mobility, difficulty walking, decreased ROM, decreased strength, and pain.    ACTIVITY LIMITATIONS: carrying, lifting, bending, standing, squatting, stairs, transfers, and  locomotion level   PARTICIPATION LIMITATIONS: cleaning, laundry, and community activity   PERSONAL FACTORS: Time since onset of injury/illness/exacerbation and 3+ comorbidities: atypical angina, HTN, myelopathy, bipolar disorder, chronic pain syndrome, 2021 R TMA, L digit 4 amputation 2019  are also affecting patient's functional outcome.    REHAB POTENTIAL: Fair given chronicity and comorbidities   CLINICAL DECISION MAKING: Evolving/moderate complexity   EVALUATION COMPLEXITY: Moderate     GOALS: Goals reviewed with patient? Yes   SHORT TERM GOALS: Target date: 04/02/2022  Pt will demonstrate appropriate understanding and performance of initially prescribed HEP in order to facilitate improved independence with management of symptoms.  Baseline: HEP provided on eval Goal status: MET   2. Pt will score greater than or equal to 46% on FOTO in order to demonstrate improved perception of function due to symptoms.            Baseline: 40%            Goal status: ONGOING   LONG TERM GOALS: Target date: 04/30/2022    Pt will score 52% or greater on FOTO in order to demonstrate improved perception of function due to symptoms.  Baseline: 40% Goal status: INITIAL   2.  Pt will demonstrate at least 20 degrees of L hip IR AROM in order to facilitate  improved tolerance/mechanics with functional mobility.  Baseline: see ROM chart above Goal status: INITIAL   3.  Pt will demonstrate grossly symmetrical hip MMT to facilitate improved functional strength.   Baseline: see MMT chart above Goal status: INITIAL   4.  Pt will be able to perform 5xSTS in less than or equal to 10 sec w/o UE support in order to demonstrate reduced fall risk and improved functional independence (MCID 5xSTS = 2.3 sec). Baseline: 12sec heavy UE use standard chair Goal status: INITIAL   5. Pt will report ability to walk/stand up to 30 min with less than 4/10 pain on NPS in order to facilitate improved tolerance to  household tasks.             Baseline: up to 8/10 with household tasks            Goal status: INITIAL   PLAN:   PT FREQUENCY: 1-2x/week   PT DURATION: 8 weeks   PLANNED INTERVENTIONS: Therapeutic exercises, Therapeutic activity, Neuromuscular re-education, Balance training, Gait training, Patient/Family education, Self Care, Joint mobilization, Stair training, Aquatic Therapy, Cryotherapy, Moist heat, Taping, Manual therapy, and Re-evaluation   PLAN FOR NEXT SESSION: *** Progress ROM/strengthening exercises as able/appropriate, review HEP.      Leeroy Cha PT, DPT 04/01/2022 8:35 AM

## 2022-04-02 ENCOUNTER — Ambulatory Visit: Payer: Medicare Other | Admitting: Physical Therapy

## 2022-04-05 ENCOUNTER — Encounter: Payer: Self-pay | Admitting: Podiatry

## 2022-04-05 ENCOUNTER — Ambulatory Visit (INDEPENDENT_AMBULATORY_CARE_PROVIDER_SITE_OTHER): Payer: Medicare Other | Admitting: Podiatry

## 2022-04-05 VITALS — BP 132/81 | HR 87

## 2022-04-05 DIAGNOSIS — L97511 Non-pressure chronic ulcer of other part of right foot limited to breakdown of skin: Secondary | ICD-10-CM

## 2022-04-05 DIAGNOSIS — L600 Ingrowing nail: Secondary | ICD-10-CM

## 2022-04-05 NOTE — Progress Notes (Unsigned)
Subjective: Chief Complaint  Patient presents with   Foot Ulcer    Left foot hallux ulcer, right foot ulcer, patient denies any pain, no drainage, patsient states the foot is doing better this visit,      56 year old female presents for above concerns.  She said the toe is doing much better on the left side.  Skin is just dry.  She is on the antibiotics.  She noticed a wound on the bottom of her right foot today, earlier this morning.  The wound the lateral aspect of the right foot is improving and scabbed over.  No drainage or pus.     Objective: AAO x3, NAD DP/PT pulses palpable bilaterally, CRT less than 3 seconds Status post partial nail avulsion left medial hallux.  Edema erythema patient ultimately resolved.  Skin is dry and callused and I debrided this today there is no ongoing ulceration.  Procedure site appears to be healed.  There is no drainage or pus.  No fluctuance or crepitation.  Appears that the area is doing much better. On the outside aspect of the right foot, lateral aspect the small scab from where she had a wound there previously.  No surrounding erythema, ascending cellulitis.  No fluctuance or crepitation.  Today there is a new wound on the plantar medial aspect of the foot measuring 0.5 x 0.5 x 0.1 cm without any probing, amount or tunneling.  There is no fluctuance or crepitation.  No malodor. No pain with calf compression, swelling, warmth, erythema  Assessment: Left medial ingrown, infection; right foot ulceration  Plan: -All treatment options discussed with the patient including all alternatives, risks, complications.  -Left side continue improved.  I debrided any loose skin to the any complications or bleeding.  Finish course of antibiotics. -Also the lateral right foot wound is improving.  Continue antibiotics and offloading. -She has a new ulcer on the bottom of her right foot.  Recommended Prisma every other day.  Monitor for signs or symptoms of infection,  offloading.  [name] DPM

## 2022-04-06 ENCOUNTER — Telehealth: Payer: Self-pay | Admitting: Neurology

## 2022-04-06 IMAGING — DX DG SHOULDER 2+V*R*
2 series · 2 of 2 positions shown · non-contrast
Comparison: None.

CLINICAL DATA: Right shoulder pain after fall.

EXAM:
RIGHT SHOULDER - 2+ VIEW

[shoulder grashey]
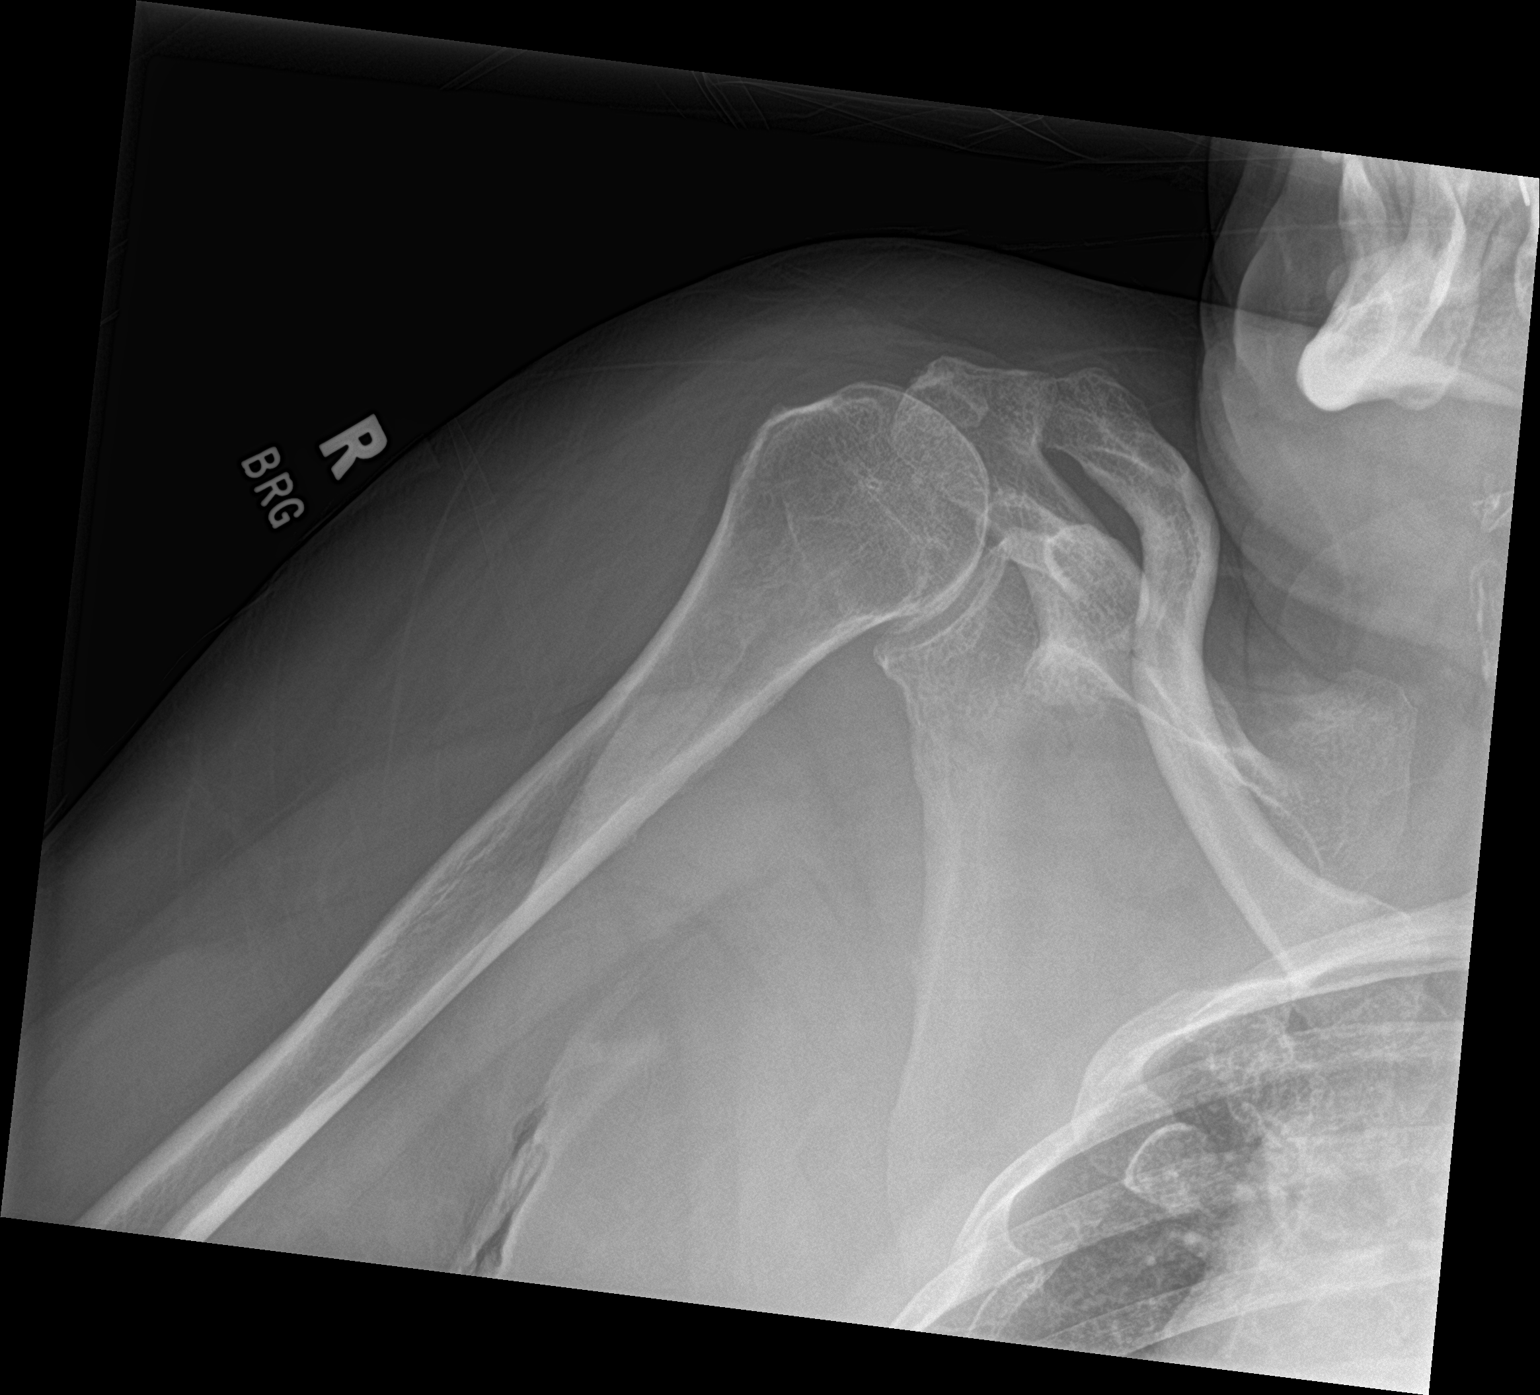

[shoulder y view]
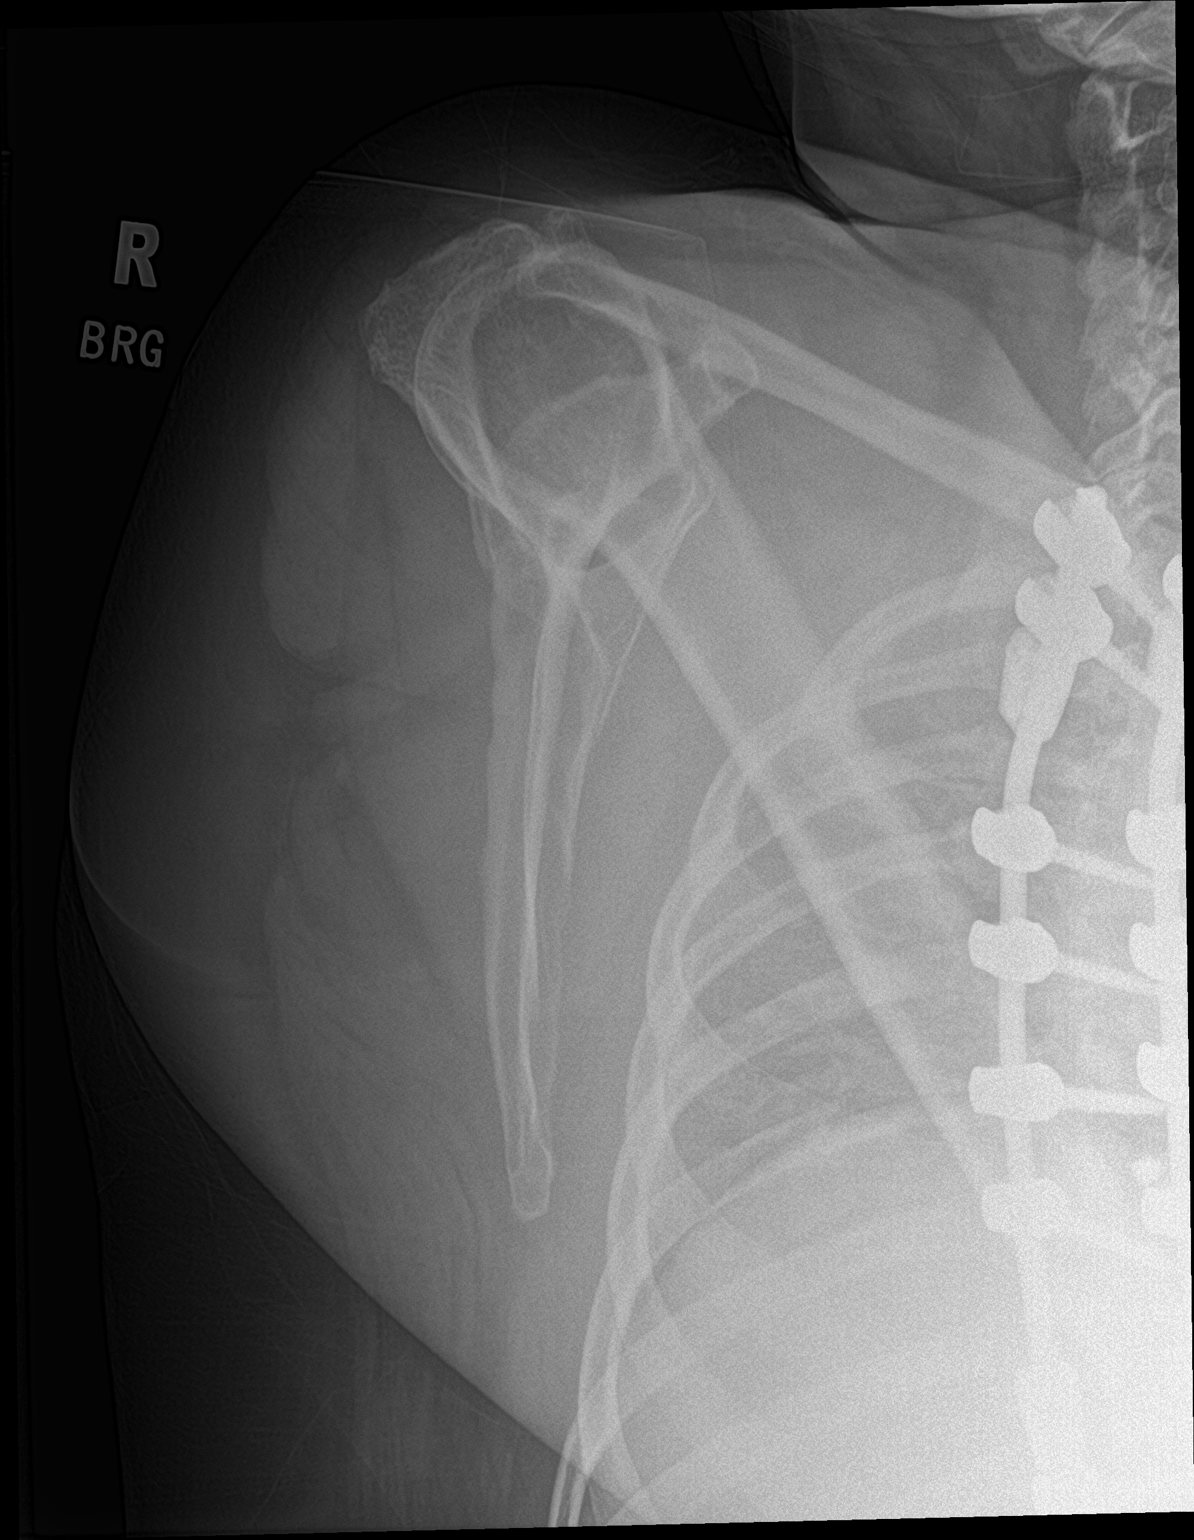

[2 of 2 positions shown; findings below may reference images not displayed]

FINDINGS: There is no evidence of fracture or dislocation. There is no
evidence of arthropathy or other focal bone abnormality. Soft
tissues are unremarkable.
IMPRESSION: Negative.

## 2022-04-06 IMAGING — DX DG THORACIC SPINE 3V
3 series · 3 of 3 positions shown · non-contrast
Comparison: 11/06/2019

CLINICAL DATA: Trip and fall, pain

EXAM:
THORACIC SPINE - 3 VIEWS

[t-spine ap]
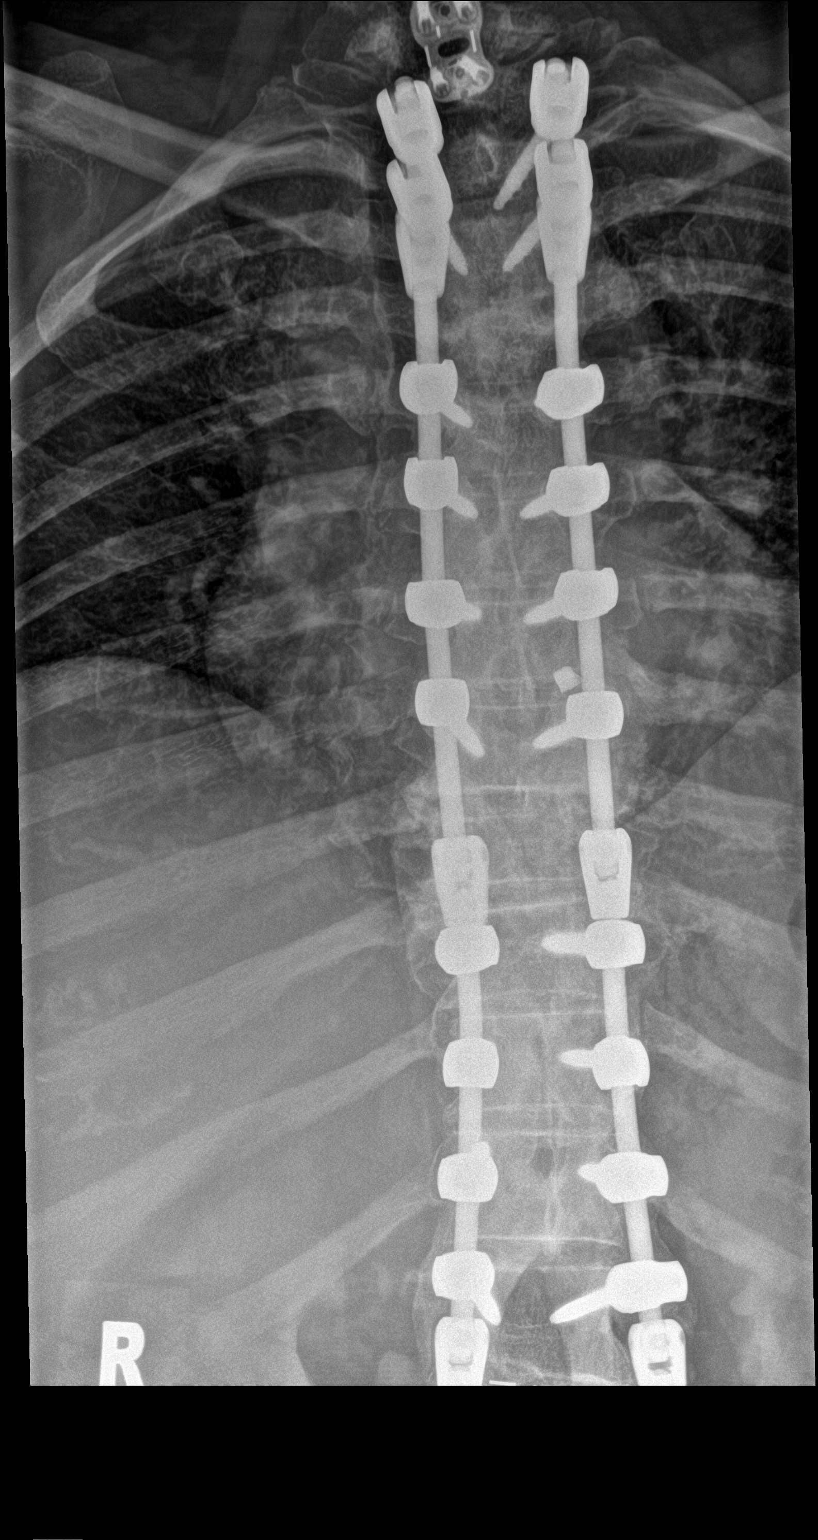

[t-spine lat]
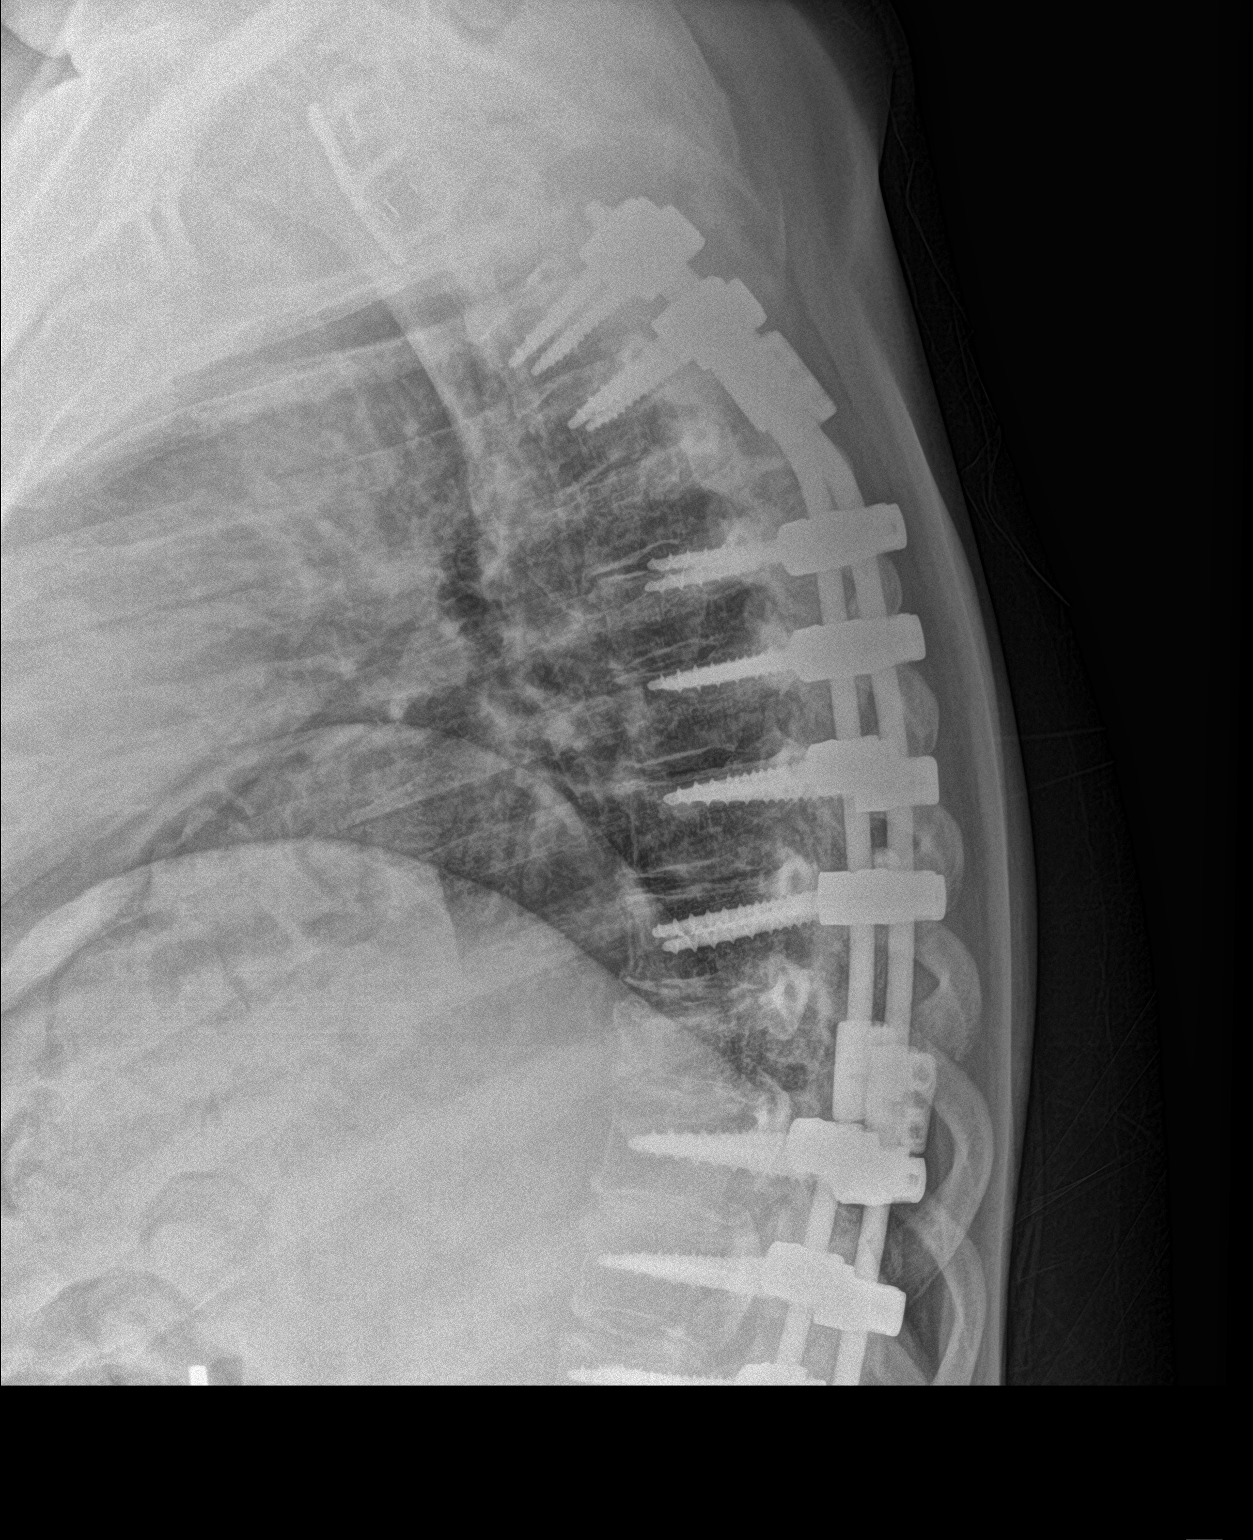

[t-spine swimmers]
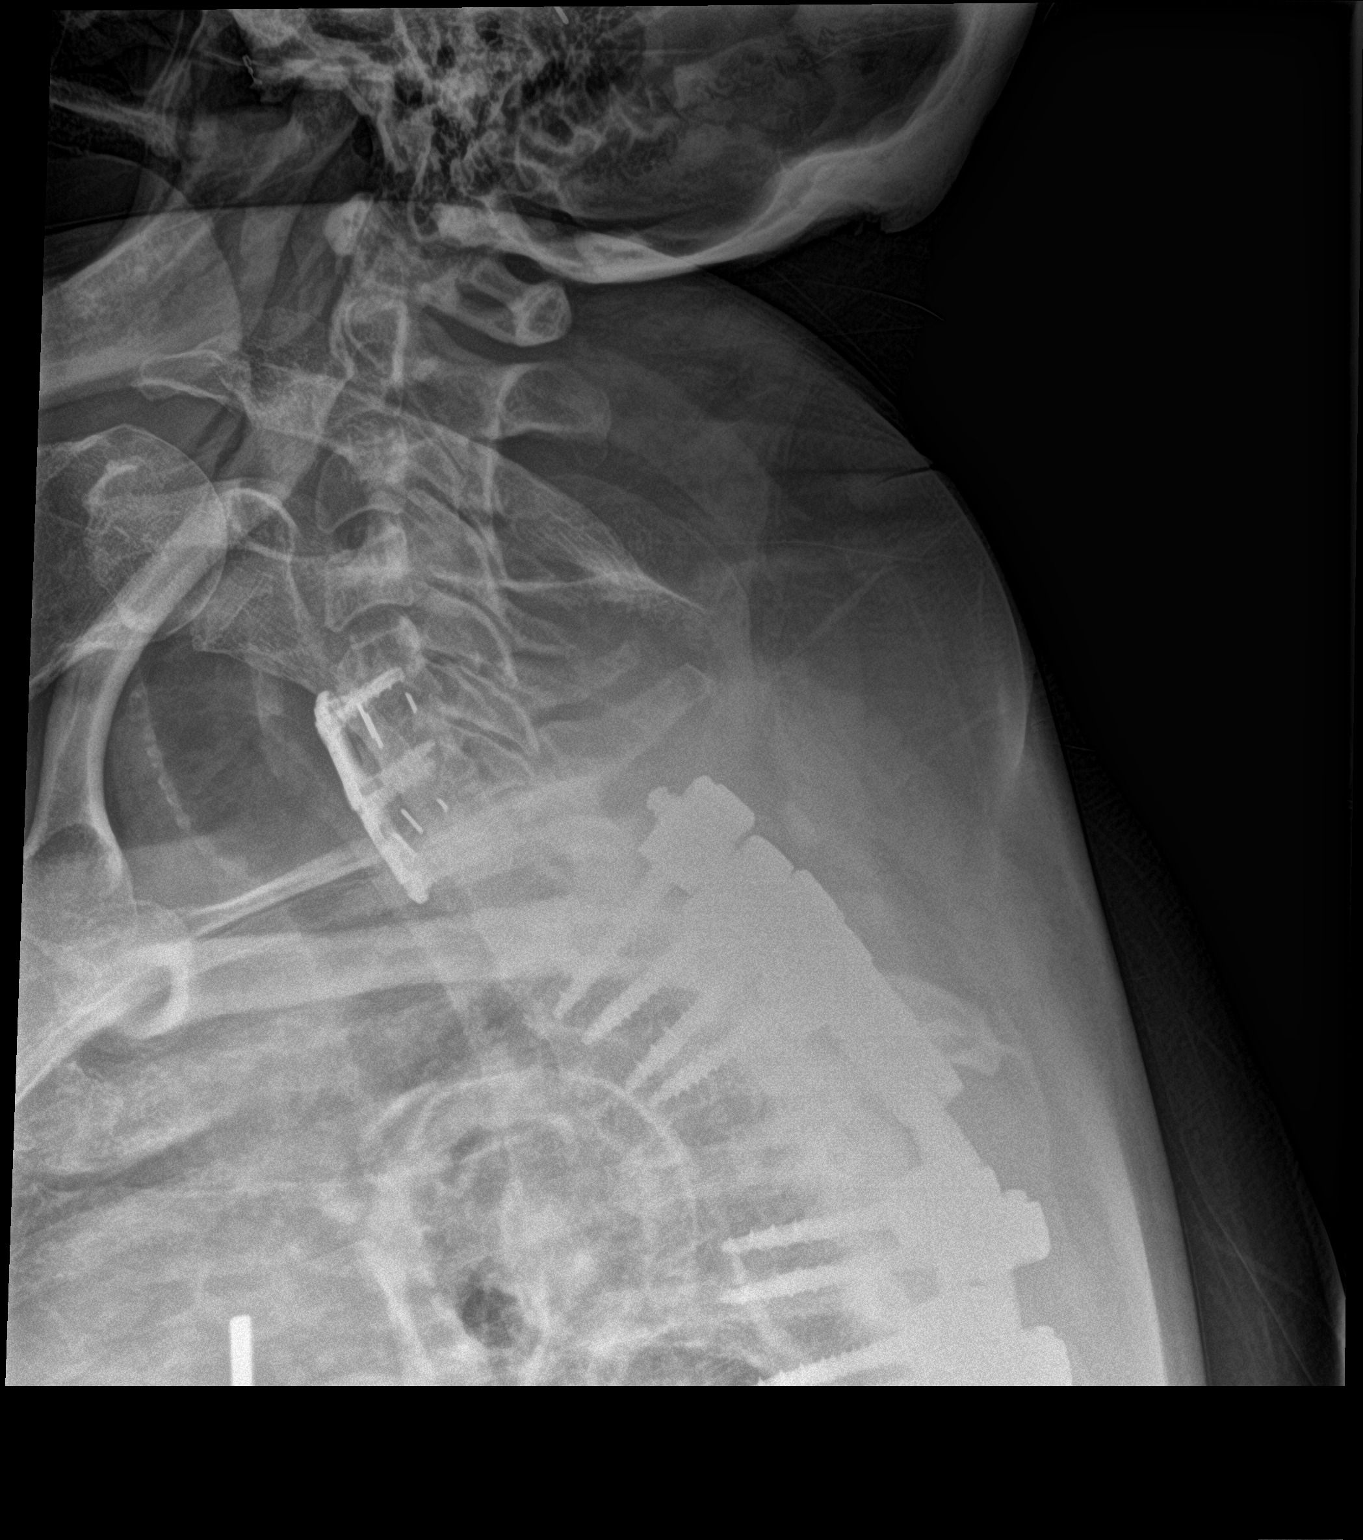

[3 of 3 positions shown; findings below may reference images not displayed]

FINDINGS: There is extensive posterior thoracolumbar rod fusion, bridging
compression deformities of T3 and T8. Multilevel discectomy of the
lumbar spine. There is a mildly displaced fracture of the right
fusion rod at the level of L2-L3. No evidence of acute fracture or
perihardware fracture. The partially imaged chest and abdomen are
unremarkable.
IMPRESSION: 1. There is extensive posterior thoracolumbar rod fusion, bridging
compression deformities of T3 and T8. Multilevel discectomy of the
lumbar spine. No evidence of acute fracture or perihardware
fracture.

2. There is a mildly displaced fracture of the right sided fusion
rod at the level of L2-L3, of uncertain acuity.

## 2022-04-06 IMAGING — DX DG LUMBAR SPINE COMPLETE 4+V
5 series · 5 of 5 positions shown · non-contrast
Comparison: September 04, 2019.

CLINICAL DATA: Back pain after fall.

EXAM:
LUMBAR SPINE - COMPLETE 4+ VIEW

[l-spine ap]
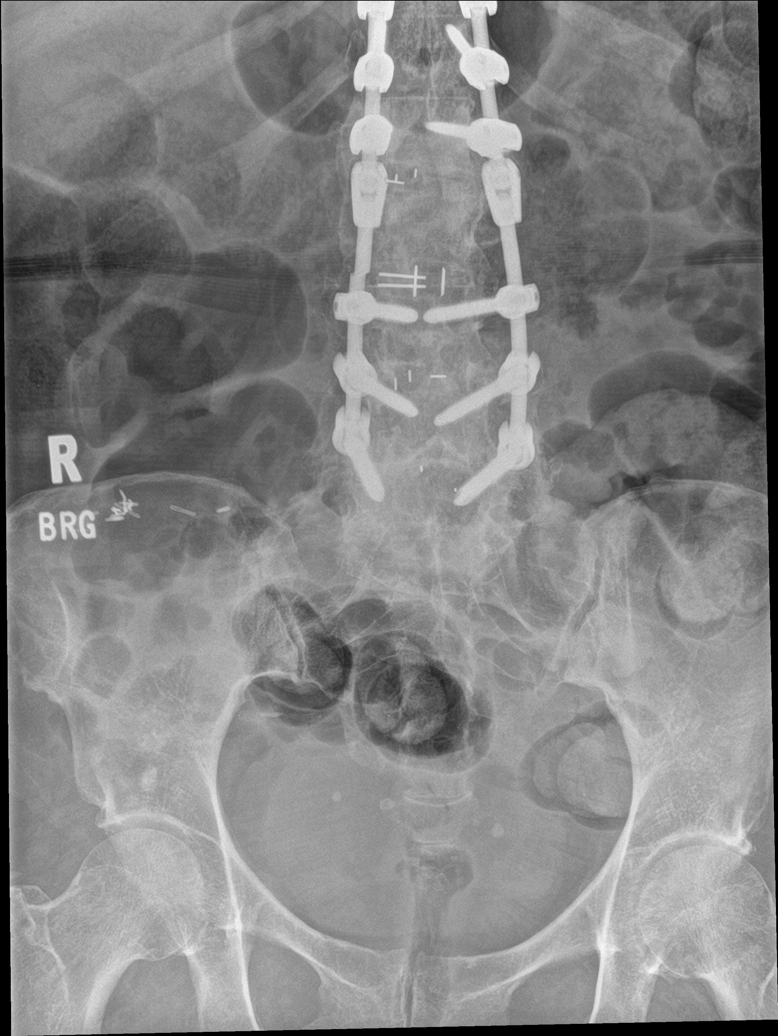

[l-spine obl (1 of 2)]
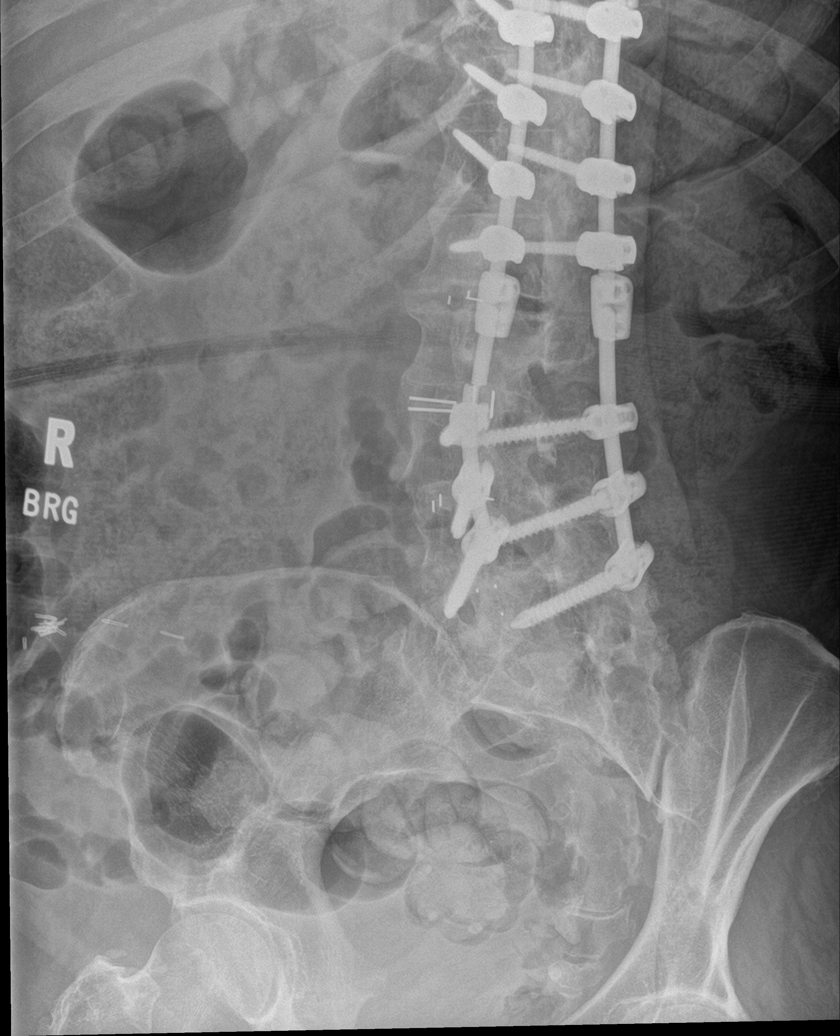

[l-spine obl (2 of 2)]
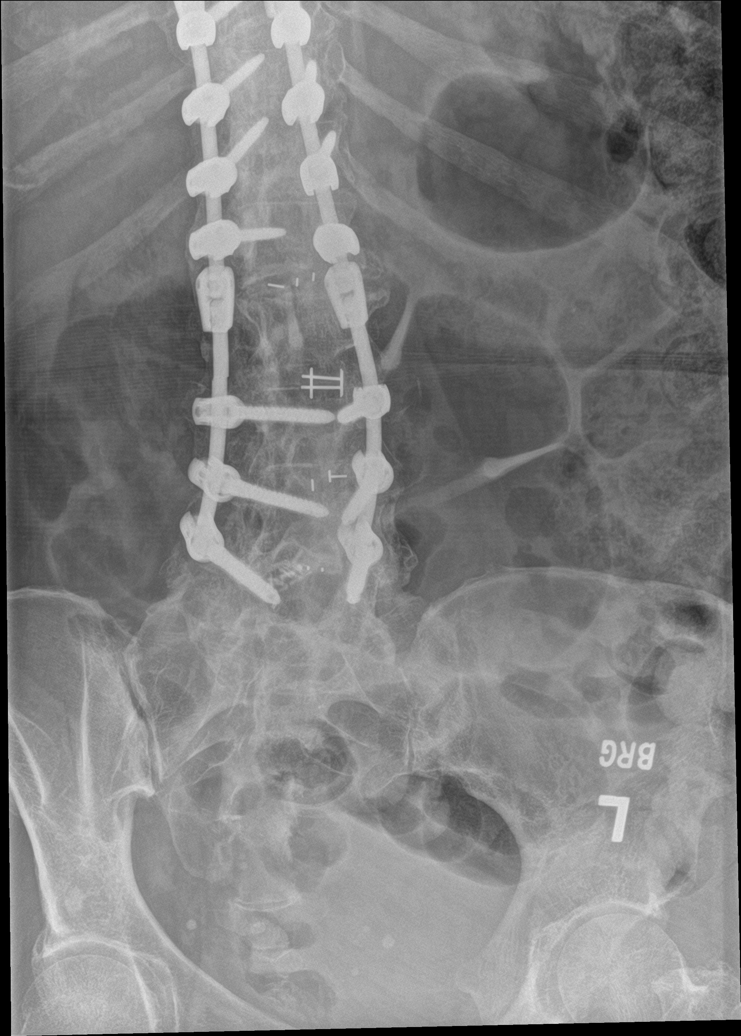

[l-spine lat]
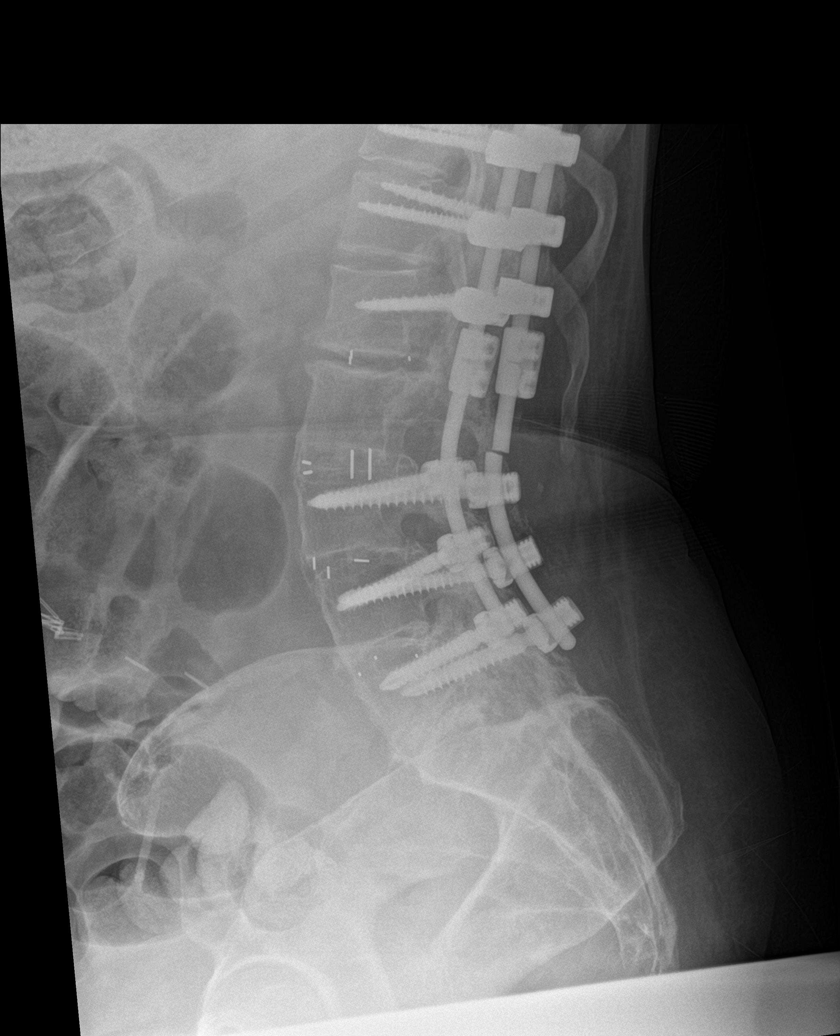

[l-spine spot]
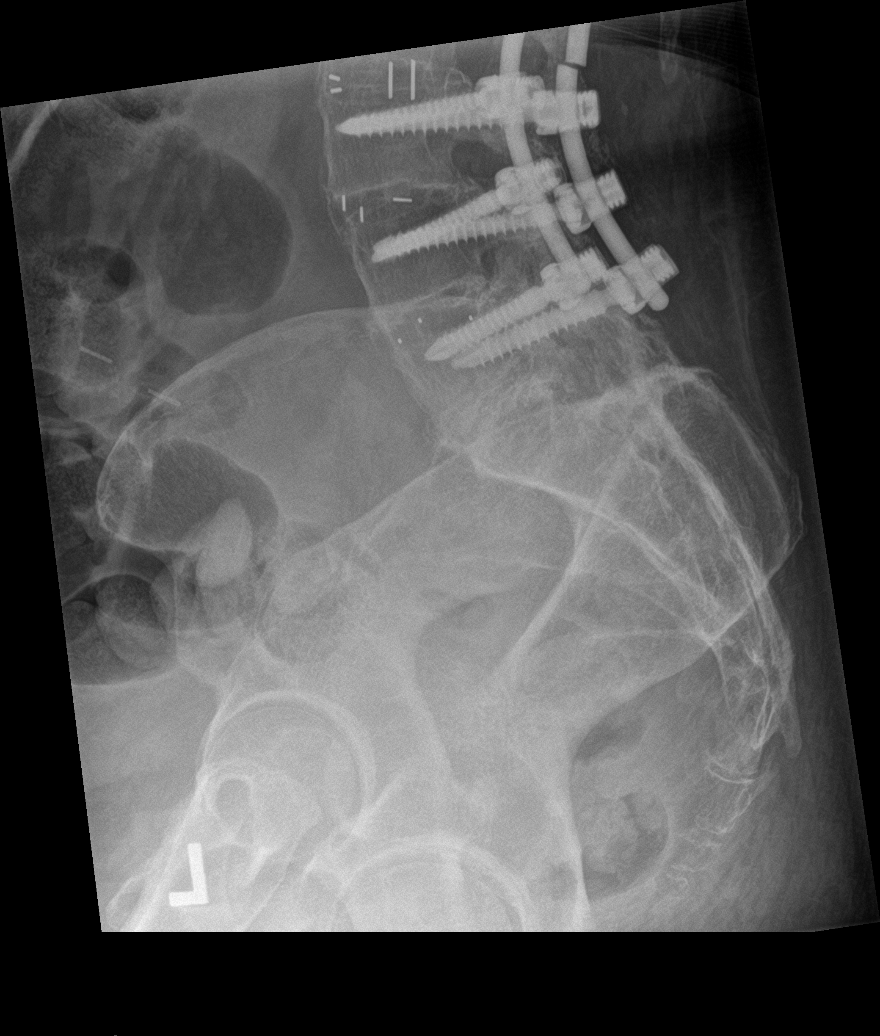

[5 of 5 positions shown; findings below may reference images not displayed]

FINDINGS: Status post surgical posterior fusion extending from lower thoracic
spine to L5. Bilateral pedicular screw placement is noted at all
levels except L2. Interbody fusion is noted at all levels. No
vertebral body fracture is noted. However, fracture is seen
involving the right-sided fixation rod at the L2-3 level.
IMPRESSION: Extensive postsurgical and degenerative changes as described above.
No vertebral body fracture is noted. However, there is noted a
fracture involving the right-sided fixation rod at the L2-3 level.

## 2022-04-06 NOTE — Telephone Encounter (Signed)
03/24/22 [employer] medicare/medicaid no auth req   left VM 04/05/22 [place]

## 2022-04-07 ENCOUNTER — Ambulatory Visit: Payer: Medicare Other | Admitting: Physical Therapy

## 2022-04-07 ENCOUNTER — Encounter: Payer: Self-pay | Admitting: Physical Therapy

## 2022-04-07 DIAGNOSIS — M25652 Stiffness of left hip, not elsewhere classified: Secondary | ICD-10-CM | POA: Diagnosis not present

## 2022-04-07 DIAGNOSIS — R2689 Other abnormalities of gait and mobility: Secondary | ICD-10-CM | POA: Diagnosis not present

## 2022-04-07 DIAGNOSIS — M6281 Muscle weakness (generalized): Secondary | ICD-10-CM | POA: Diagnosis not present

## 2022-04-07 DIAGNOSIS — M25552 Pain in left hip: Secondary | ICD-10-CM

## 2022-04-07 NOTE — Therapy (Addendum)
OUTPATIENT PHYSICAL THERAPY TREATMENT NOTE + NO VISIT DISCHARGE SUMMARY ADDENDUM   Patient Name: [name] [name] MRN: [id number] DOB:[birth year], 56 y.o., female Today's Date: 04/07/2022  PCP: [name], MD   REFERRING PROVIDER: Harrel Carina, MD  END OF SESSION:   PT End of Session - 04/07/22 0805     Visit Number 5    Number of Visits 17    Date for PT Re-Evaluation 04/30/22    Authorization Type [employer] medicare/medicaid    Authorization Time Period no auth, FOTO v6 v10, kx 15    Progress Note Due on Visit 10    PT Start Time 0803    PT Stop Time 0843    PT Time Calculation (min) 40 min             Past Medical History:  Diagnosis Date   Anxiety    Arthritis    Phreesia 02/08/2020   Asthma    mild intermittent   Asthma    Phreesia 02/08/2020   Bipolar 1 disorder ([place])    ect treatments last treatment Sep 02 1011   Depression    Depression    Phreesia 02/08/2020   Depression    Phreesia 07/10/2020   GERD (gastroesophageal reflux disease)    Hypertension    Pre-diabetes    Seizures ([place])    last seizure was so long ago she can't remember.    Sleep apnea    wears CPAP, uncertain of setting   Substance abuse ([place])    Phreesia 02/08/2020   Past Surgical History:  Procedure Laterality Date   ABDOMINAL HYSTERECTOMY     AMPUTATION TOE Left 02/02/2018   Procedure: AMPUTATION TOE Left 4th toe;  Surgeon: [name], DPM;  Location: [facility];  Service: Podiatry;  Laterality: Left;   APPENDECTOMY N/A    Phreesia 02/08/2020   BACK SURGERY     CARPAL TUNNEL RELEASE     x2   IRRIGATION AND DEBRIDEMENT ABSCESS Right 10/25/2020   Procedure: IRRIGATION AND DEBRIDEMENT ABSCESS OF FOOT AND APPLICATION OF GRAFT;  Surgeon: [name], DPM;  Location: [facility];  Service: Podiatry;  Laterality: Right;   Laproscopic knee surgery     NECK SURGERY  03/15/2017   PLANTAR FASCIA RELEASE     x2   SPINE SURGERY N/A    Phreesia 02/08/2020   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   Procedure: TRANSMETATARSAL AMPUTATION;  Surgeon: [name], DPM;  Location: [facility];  Service: Podiatry;  Laterality: Right;   Patient Active Problem List   Diagnosis Date Noted   Diarrhea in adult patient    Weakness 06/30/2021   Gastroenteritis, infectious 06/30/2021   AKI (acute kidney injury) ([place]) 06/30/2021   Hyponatremia 06/30/2021   Open wound of right foot, initial encounter 10/21/2020   Finger ulcer ([place]) 10/21/2020   Syncope 08/26/2019   Non-healing open wound of toe 05/15/2019   Osteomyelitis ([name]) 05/15/2019   Palpitations 10/26/2018   Foot ulcer ([place]) 01/31/2018   Hyperglycemia 01/31/2018   Left leg cellulitis 09/24/2017   Syncope and collapse    Near syncope 11/23/2016   Abdominal pain 09/23/2016   Anxiety 09/23/2016   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[place]) 09/23/2016   Constipation 09/23/2016   DDD (degenerative disc disease), lumbosacral 09/23/2016   Migraines 09/23/2016   Myelopathy ([place]) 09/23/2016   Nausea 09/23/2016   Post laminectomy syndrome 09/23/2016   Pseudoarthrosis of lumbar spine 09/23/2016   Shortness of breath 09/23/2016   Sleep apnea  09/23/2016   Spinal stenosis of lumbar region 09/23/2016   Vertigo 09/23/2016   Hallux malleus 11/15/2015   Chronic pain syndrome    GERD (gastroesophageal reflux disease)    Depression    Rash 11/14/2015   Toe ulcer, right ([place]) 04/11/2015   Cellulitis of toe of right foot 04/11/2015   Nonspecific chest pain    Chest pain 10/26/2014   Obesity (BMI 30-39.9) 10/26/2014   Hypokalemia 10/26/2014   Manic bipolar I disorder ([place]) 10/26/2014   Atypical angina 10/26/2014   Tardive dyskinesia 08/26/2011   Manic bipolar I disorder with rapid cycling ([place]) 05/18/2011    Class: Acute   Asthma 02/24/2011   Essential hypertension 02/24/2011   History of migraine headaches 02/24/2011   REFERRING DIAG: primary OA of left hip  THERAPY DIAG:  Pain in left hip  Stiffness of  left hip, not elsewhere classified  Rationale for Evaluation and Treatment Rehabilitation  PERTINENT HISTORY: atypical angina, HTN, migraines, near syncope, GERD, myelopathy, bipolar disorder, chronic pain syndrome, 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 Hx multiple lumbar fusions, most recently about a month ago, ulcer R foot with current CAM boot  PRECAUTIONS: fall risk, lumbar fusion, CAM boot RLE, cardiac hx per chart (although pt denies any current issues)  SUBJECTIVE:                                                                                                                                                                                      SUBJECTIVE STATEMENT:  I had more pain after last session. The next day was terrible. I have one more week until my shoulder replacement. Ly right knee is giving out too and and I have another brace.    PAIN:  Are you having pain: up to 7/10 with weightbearing.  Location: L hip  How would you describe your pain? sharp Best in past week: 0/10 Worst in past week: 8/10 (eases in about 58min or so) Aggravating factors: bending, walking, lying flat, stairs Easing factors: rest, ice, medication (prescription)   OBJECTIVE: (objective measures completed at initial evaluation unless otherwise dated)   DIAGNOSTIC FINDINGS: no hip imaging in chart, pt states she had L hip MRI that showed arthritis and labral tear   PATIENT SURVEYS:  FOTO 40% FOTO 33%    COGNITION: Overall cognitive status: Within functional limits for tasks assessed                         SENSATION: Chronic numbness/tingling in legs, light touch appears grossly intact on exam although not formally assessed given CAM boot   POSTURE: forward head, rounded  shoulders, trunk lean to R     LOWER EXTREMITY ROM:   Active  Right eval Left eval Left 04/07/22  Hip flexion       Hip extension       Hip internal rotation (seated) 22 16p! 15 P!  Hip external rotation  (seated) 38 28 mild pain 25   (Blank rows = not tested)   Comments:  concordant pain w/ IR on L. PROM grossly comparable to AROM, limited by pain/stiffness   LOWER EXTREMITY MMT:     MMT Right eval Left eval Right  04/07/22 Left 04/07/22  Hip flexion 4 3+ p!  4-  Hip abduction (modified sitting) 4+ 4+    Hip internal rotation        Hip external rotation        Knee flexion 4+ 4- p!   4  Knee extension 4- 4- hip pain  4   (Blank rows = not tested)   Comments:    FUNCTIONAL TESTS:  5xSTS: 12sec w/ UE support, increasing pain with repetition, standard chair, reduced velocity with repetition   GAIT: Distance walked: within clinic Assistive device utilized: Quad cane small base, CAM boot RLE Level of assistance: Modified independence Comments: reduced gait speed/cadence, increased lateral weight shifting, reduced step length on R     TODAY'S TREATMENT:                                                                                                                              [facility] Adult PT Treatment:                                                DATE: 04/07/22 Therapeutic Exercise: Nustep L2 LE only x 5 minutes  LAQ 10 x 2  March 10  x 2  Seated clam red band 10 x 2  Seated heel slides with cloth Left 10 x 2  Supine clam AROM Supine Marching  Supine PPT 10 x 2  Supine Bridge 10 x 2    [facility] Adult PT Treatment:                                                DATE: 03/31/22 Therapeutic Exercise: Nustep L2 LE only x 5 minutes  LAQ 10 x 2  March 10  x 2  Seated clam red band 10 x 2  Seated heel slides with cloth Left 10 x 2  Supine clam AROM Supine PPT 10 x 2  Supine Bridge 10 x 2   Modalities: HMP to anterior left hip/groin x15 min    [facility] Adult PT Treatment:  DATE: 03/26/22 Therapeutic Exercise: Seated March 10 x 2  Seated clam red 10 x 2  Seated LAQ LT red band 10 x 2  Seated knee flexion Red band 10 x 2  Seated  Ball squeeze 10 x 2 Nustep L3 x 6 mintes  STS 10 x 2  Supine piriformis stretch 15 sec x 4 , push and pull Bridge 10 x 2     [facility] Adult PT Treatment:                                                DATE: 03/24/22 Therapeutic Exercise: Seated March 10 x 2 each Seated Red band clam 10 x 2  Seated LAQ Left 10 x 2  Seated heel slide with cloth on floor 10 x 2  Nustep L3 LE only x 5 mintes  STS from elevated mat x 5 with UE, x 5 without UE STS x 5 from standard chair, without UE x 5    [facility] Adult PT Treatment:                                                DATE: 03/05/22 Therapeutic Exercise: Seated march x10 LLE, cues for HEP and form Adductor isos x10, cues for HEP setup and form     PATIENT EDUCATION:  Education details: Pt education on PT impairments, prognosis, and POC. Informed consent. Rationale for interventions, safe/appropriate HEP performance Person educated: Patient Education method: Explanation, Demonstration, Tactile cues, Verbal cues, and Handouts Education comprehension: verbalized understanding, returned demonstration, verbal cues required, tactile cues required, and needs further education     HOME EXERCISE PROGRAM: Access Code: [email or web address] URL: [email or web address] Date: 03/05/2022 Prepared by: Enis Slipper   Exercises - Seated March  - 1 x daily - 7 x weekly - 3 sets - 10 reps - Seated Hip Adduction Isometrics with Ball  - 1 x daily - 7 x weekly - 3 sets - 10 reps Added 03/24/22 - seated clam with resistance band  - 1 x daily - 7 x weekly - 3 sets - 10 reps - Seated Long Arc Quad  - 1 x daily - 7 x weekly - 2 sets - 10 reps - Seated Knee Flexion Extension AROM   - 1 x daily - 7 x weekly - 2 sets - 10 reps - Sit to stand with control  - 1 x daily - 7 x weekly - 2 sets - 5 reps Added 03/26/22 - Cross one leg over: Push and pull knee   - 1 x daily - 7 x weekly - 1 sets - 3-4 reps - 10-15 hold - Beginner Bridge  - 1 x daily - 7 x weekly - 2 sets -  10 reps - 5 hold   ASSESSMENT:   CLINICAL IMPRESSION: Pt is a 56 y.o woman who arrives to PT session on this date for treatment of L hip pain. She arrives with [facility], antalgic gait and in a right Cam boot. She reports increased pain on days following therapy despite the low level exercises she completes. She will have shoulder replacement in 1 week. She has a comprehensive HEP that will not be progressed further at this time until after she is cleared to  return following surgery. Today, her HEP was reviewed and she will continue this as tolerated. She has been using heat pad more at home which provides some relief.     OBJECTIVE IMPAIRMENTS: Abnormal gait, decreased activity tolerance, decreased balance, decreased endurance, decreased mobility, difficulty walking, decreased ROM, decreased strength, and pain.    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    REHAB POTENTIAL: Fair given chronicity and comorbidities   CLINICAL DECISION MAKING: Evolving/moderate complexity   EVALUATION COMPLEXITY: Moderate     GOALS: Goals reviewed with patient? Yes   SHORT TERM GOALS: Target date: 04/02/2022  Pt will demonstrate appropriate understanding and performance of initially prescribed HEP in order to facilitate improved independence with management of symptoms.  Baseline: HEP provided on eval Goal status: MET   2. Pt will score greater than or equal to 46% on FOTO in order to demonstrate improved perception of function due to symptoms.            Baseline: 40%  12/200/23: decreased to 33%            Goal status:  ONGOING   LONG TERM GOALS: Target date: 04/30/2022    Pt will score 52% or greater on FOTO in order to demonstrate improved perception of function due to symptoms.  Baseline: 40% 04/07/22: 33%  Goal status: INITIAL   2.  Pt will demonstrate at least 20 degrees of L hip IR AROM in order to facilitate improved tolerance/mechanics with functional mobility.  Baseline: see ROM chart above 04/07/22: see ROM chart above Goal status: ONGOING   3.  Pt will demonstrate grossly symmetrical hip MMT to facilitate improved functional strength.   Baseline: see MMT chart above 04/07/22: improved hip flexion, and knee flexion and ext  Goal status: ONGOING   4.  Pt will be able to perform 5xSTS in less than or equal to 10 sec w/o UE support in order to demonstrate reduced fall risk and improved functional independence (MCID 5xSTS = 2.3 sec). Baseline: 12sec heavy UE use standard chair Goal status: NOT ASSESSED   5. Pt will report ability to walk/stand up to 30 min with less than 4/10 pain on NPS in order to facilitate improved tolerance to household tasks.             Baseline: up to 8/10 with household tasks  04/07/22: no improvement             Goal status: ONGOING   PLAN:   PT FREQUENCY: 1-2x/week   PT DURATION: 8 weeks   PLANNED INTERVENTIONS: Therapeutic exercises, Therapeutic activity, Neuromuscular re-education, Balance training, Gait training, Patient/Family education, Self Care, Joint mobilization, Stair training, Aquatic Therapy, Cryotherapy, Moist heat, Taping, Manual therapy, and Re-evaluation   PLAN FOR NEXT SESSION: HOLD PT UNTIL AFTER SHOULDER SURGERY 04/14/22: Progress ROM/strengthening exercises as able/appropriate, review HEP.      Hessie Diener, PTA 04/07/22 9:43 AM Phone: [phone] Fax: [phone]     Addendum for no visit discharge:   PHYSICAL THERAPY DISCHARGE SUMMARY  Visits from Start of Care: 5  Current functional level related to goals / functional  outcomes: Unable to assess   Remaining deficits: Unable to assess - see above for  most recent assessment   Education / Equipment: Unable to assess   Patient unable to agree to discharge due to lack of follow up. Patient goals were  unable to be assessed as she has not been seen in clinic since 04/07/22 . Patient is being discharged due to a change in medical status/not returning since last visit.   Leeroy Cha PT, DPT 05/27/2022 4:16 PM

## 2022-04-07 NOTE — Telephone Encounter (Signed)
Patient returned the call.  HST- [employer] medicare/medicaid no auth req   She is scheduled at [facility] for 05/11/22 at 8 AM.  Mailed packet to the patient.

## 2022-04-07 NOTE — Patient Instructions (Addendum)
SURGICAL WAITING ROOM VISITATION Patients having surgery or a procedure may have no more than 2 support people in the waiting area - these visitors may rotate in the visitor waiting room.   Due to an increase in RSV and influenza rates and associated hospitalizations, children ages 24 and under may not visit patients in [place]. If the patient needs to stay at the hospital during part of their recovery, the visitor guidelines for inpatient rooms apply.  PRE-OP VISITATION  Pre-op nurse will coordinate an appropriate time for 1 support person to accompany the patient in pre-op.  This support person may not rotate.  This visitor will be contacted when the time is appropriate for the visitor to come back in the pre-op area.  Please refer to the [facility] website for the visitor guidelines for Inpatients (after your surgery is over and you are in a regular room).  You are not required to quarantine at this time prior to your surgery. However, you must do this: Hand Hygiene often Do NOT share personal items Notify your provider if you are in close contact with someone who has COVID or you develop fever 100.4 or greater, new onset of sneezing, cough, sore throat, shortness of breath or body aches.  If you test positive for Covid or have been in contact with anyone that has tested positive in the last 10 days please notify you surgeon.    Your procedure is scheduled on: Wednesday  April 14, 2022    Report to [facility] Main Entrance: [place] entrance where the [employer] is available.   Report to admitting at: 05:15 AM  +++++Call this number if you have any questions or problems the morning of surgery [phone]  DO NOT EAT OR DRINK ANYTHING AFTER MIDNIGHT THE NIGHT PRIOR TO YOUR SURGERY / PROCEDURE.   FOLLOW  ANY ADDITIONAL PRE OP INSTRUCTIONS YOU RECEIVED FROM YOUR SURGEON'S OFFICE!!!   Oral Hygiene is also important to reduce your risk of infection.         Remember - BRUSH YOUR TEETH THE MORNING OF SURGERY WITH YOUR REGULAR TOOTHPASTE   Take ONLY these medicines the morning of surgery with A SIP OF WATER: Metoprolol, pantoprazole (Protonix), Buspirone (Buspar), lamotrigine (Lamictal), Pregabalin (Lyrica) topiramate (Topamax), Duloxetine (Cymbalta), escitalopram (Lexapro). If needed you may take Percocet, and use your Albuterol inhaler, Flonase nasal spray.    If You have been diagnosed with Sleep Apnea - Bring CPAP mask and tubing day of surgery. We will provide you with a CPAP machine on the day of your surgery.    ????has new CPAP Machine                   You may not have any metal on your body including hair pins, jewelry, and body piercing  Do not wear make-up, lotions, powders, perfumes or deodorant  Do not wear nail polish including gel and S&S, artificial / acrylic nails, or any other type of covering on natural nails including finger and toenails. If you have artificial nails, gel coating, etc., that needs to be removed by a nail salon, Please have this removed prior to surgery. Not doing so may mean that your surgery could be cancelled or delayed if the Surgeon or anesthesia staff feels like they are unable to monitor you safely.   Do not shave 48 hours prior to surgery to avoid nicks in your skin which may contribute to postoperative infections.   Contacts, Hearing Aids, dentures  or bridgework may not be worn into surgery. DENTURES WILL BE REMOVED PRIOR TO SURGERY PLEASE DO NOT APPLY "Poly grip" OR ADHESIVES!!!  You may bring a small overnight bag with you on the day of surgery, only pack items that are not valuable. [facility] IS NOT RESPONSIBLE   FOR VALUABLES THAT ARE LOST OR STOLEN.   Do not bring your home medications to the hospital. The Pharmacy will dispense medications listed on your medication list to you during your admission in the Hospital.  Please read over the following fact sheets you were given: IF YOU HAVE  QUESTIONS ABOUT YOUR PRE-OP INSTRUCTIONS, PLEASE CALL [phone]  ([place])   [place] [facility] - Preparing for Surgery Before surgery, you can play an important role.  Because skin is not sterile, your skin needs to be as free of germs as possible.  You can reduce the number of germs on your skin by washing with CHG (chlorahexidine gluconate) soap before surgery.  CHG is an antiseptic cleaner which kills germs and bonds with the skin to continue killing germs even after washing. Please DO NOT use if you have an allergy to CHG or antibacterial soaps.  If your skin becomes reddened/irritated stop using the CHG and inform your nurse when you arrive at Short Stay. Do not shave (including legs and underarms) for at least 48 hours prior to the first CHG shower.  You may shave your face/neck.  Please follow these instructions carefully:  1.  Shower with CHG Soap the night before surgery and the  morning of surgery.  2.  If you choose to wash your hair, wash your hair first as usual with your normal  shampoo.  3.  After you shampoo, rinse your hair and body thoroughly to remove the shampoo.                             4.  Use CHG as you would any other liquid soap.  You can apply chg directly to the skin and wash.  Gently with a scrungie or clean washcloth.  5.  Apply the CHG Soap to your body ONLY FROM THE NECK DOWN.   Do not use on face/ open                           Wound or open sores. Avoid contact with eyes, ears mouth and genitals (private parts).                       Wash face,  Genitals (private parts) with your normal soap.             6.  Wash thoroughly, paying special attention to the area where your  surgery  will be performed.  7.  Thoroughly rinse your body with warm water from the neck down.  8.  DO NOT shower/wash with your normal soap after using and rinsing off the CHG Soap.            9.  Pat yourself dry with a clean towel.            10.  Wear clean pajamas.            11.  Place clean  sheets on your bed the night of your first shower and do not  sleep with pets.  ON THE DAY OF SURGERY : Do not apply any  lotions/deodorants the morning of surgery.  Please wear clean clothes to the hospital/surgery center.   Preparing for Total Shoulder Arthroplasty ================================================================= Please follow these instructions carefully, in addition to any other special Bathing information that was explained to you at the Presurgical Appointment:  BENZOYL PEROXIDE 5% GEL: Used to kill bacteria on the skin which could cause an infection at the surgery site.   Please do not use if you have an allergy to benzoyl peroxide. If your skin becomes reddened/irritated stop using the benzoyl peroxide and inform your Doctor.   Starting two days before surgery, apply as follows:  1. Apply benzoyl peroxide gel in the morning and at night. Apply after taking a shower. If you are not taking a shower, clean entire shoulder front, back, and side, along with the armpit with a clean wet washcloth.  2. Place a quarter-sized dollop of the gel on your SHOULDER and rub in thoroughly, making sure to cover the front, back, and side of your shoulder, along with the armpit.   2 Days prior to Surgery First Dose  _______ Morning Second Dose  _______ Night  Day Before Surgery First Dose  ______ Morning  On the night before surgery, wash your entire body (except hair, face and private areas) with CHG Soap. THEN, rub in the LAST application of the Benzoyl Peroxide Gel on your shoulder.   3. On the Morning of Surgery wash your BODY AGAIN with CHG Soap (except hair, face and private areas)  4. DO NOT USE THE BENZOYL PEROXIDE GEL ON THE DAY OF YOUR SURGERY    FAILURE TO FOLLOW THESE INSTRUCTIONS MAY RESULT IN THE CANCELLATION OF YOUR SURGERY  PATIENT SIGNATURE_________________________________  NURSE  SIGNATURE__________________________________  ________________________________________________________________________       [name] [name]    An incentive spirometer is a tool that can help keep your lungs clear and active. This tool measures how well you are filling your lungs with each breath. Taking long deep breaths may help reverse or decrease the chance of developing breathing (pulmonary) problems (especially infection) following: A long period of time when you are unable to move or be active. BEFORE THE PROCEDURE  If the spirometer includes an indicator to show your best effort, your nurse or respiratory therapist will set it to a desired goal. If possible, sit up straight or lean slightly forward. Try not to slouch. Hold the incentive spirometer in an upright position. INSTRUCTIONS FOR USE  Sit on the edge of your bed if possible, or sit up as far as you can in bed or on a chair. Hold the incentive spirometer in an upright position. Breathe out normally. Place the mouthpiece in your mouth and seal your lips tightly around it. Breathe in slowly and as deeply as possible, raising the piston or the ball toward the top of the column. Hold your breath for 3-5 seconds or for as long as possible. Allow the piston or ball to fall to the bottom of the column. Remove the mouthpiece from your mouth and breathe out normally. Rest for a few seconds and repeat Steps 1 through 7 at least 10 times every 1-2 hours when you are awake. Take your time and take a few normal breaths between deep breaths. The spirometer may include an indicator to show your best effort. Use the indicator as a goal to work toward during each repetition. After each set of 10 deep breaths, practice coughing to be sure your lungs are clear. If you have an incision (the  cut made at the time of surgery), support your incision when coughing by placing a pillow or rolled up towels firmly against it. Once you are able to  get out of bed, walk around indoors and cough well. You may stop using the incentive spirometer when instructed by your caregiver.  RISKS AND COMPLICATIONS Take your time so you do not get dizzy or light-headed. If you are in pain, you may need to take or ask for pain medication before doing incentive spirometry. It is harder to take a deep breath if you are having pain. AFTER USE Rest and breathe slowly and easily. It can be helpful to keep track of a log of your progress. Your caregiver can provide you with a simple table to help with this. If you are using the spirometer at home, follow these instructions: [place] IF:  You are having difficultly using the spirometer. You have trouble using the spirometer as often as instructed. Your pain medication is not giving enough relief while using the spirometer. You develop fever of 100.5 F (38.1 C) or higher.                                                                                                    SEEK IMMEDIATE MEDICAL CARE IF:  You cough up bloody sputum that had not been present before. You develop fever of 102 F (38.9 C) or greater. You develop worsening pain at or near the incision site. MAKE SURE YOU:  Understand these instructions. Will watch your condition. Will get help right away if you are not doing well or get worse. Document Released: 08/16/2006 Document Revised: 06/28/2011 Document Reviewed: 10/17/2006 [employer] Patient Information 2014 [place], [place].
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
## 2022-04-08 ENCOUNTER — Encounter (HOSPITAL_COMMUNITY): Payer: Self-pay

## 2022-04-08 ENCOUNTER — Encounter (HOSPITAL_COMMUNITY)
Admission: RE | Admit: 2022-04-08 | Discharge: 2022-04-08 | Disposition: A | Discharge status: Home / Self Care | Payer: Medicare Other | Source: Ambulatory Visit | Attending: Orthopaedic Surgery | Admitting: Orthopaedic Surgery

## 2022-04-08 ENCOUNTER — Other Ambulatory Visit: Payer: Self-pay

## 2022-04-08 VITALS — BP 138/84 | HR 90 | Temp 98.4°F | Resp 22 | Ht 64.0 in | Wt 245.0 lb

## 2022-04-08 DIAGNOSIS — Z01818 Encounter for other preprocedural examination: Secondary | ICD-10-CM | POA: Insufficient documentation

## 2022-04-08 DIAGNOSIS — M19011 Primary osteoarthritis, right shoulder: Secondary | ICD-10-CM | POA: Diagnosis not present

## 2022-04-08 DIAGNOSIS — R7303 Prediabetes: Secondary | ICD-10-CM | POA: Diagnosis not present

## 2022-04-08 DIAGNOSIS — J45909 Unspecified asthma, uncomplicated: Secondary | ICD-10-CM | POA: Diagnosis not present

## 2022-04-08 DIAGNOSIS — G4730 Sleep apnea, unspecified: Secondary | ICD-10-CM | POA: Insufficient documentation

## 2022-04-08 DIAGNOSIS — I10 Essential (primary) hypertension: Secondary | ICD-10-CM | POA: Diagnosis not present

## 2022-04-08 HISTORY — DX: Headache, unspecified: R51.9

## 2022-04-08 LAB — BASIC METABOLIC PANEL
Anion gap: 11 (ref 5–15)
BUN: 11 mg/dL (ref 6–20)
CO2: 22 mmol/L (ref 22–32)
Calcium: 9.8 mg/dL (ref 8.9–10.3)
Chloride: 104 mmol/L (ref 98–111)
Creatinine, Ser: 0.64 mg/dL (ref 0.44–1.00)
GFR, Estimated: >60 mL/min (ref >60)
Glucose, Bld: 108 mg/dL — ABNORMAL HIGH (ref 70–99)
Potassium: 4.6 mmol/L (ref 3.5–5.1)
Sodium: 137 mmol/L (ref 135–145)

## 2022-04-08 LAB — CBC
HCT: 38.3 % (ref 36.0–46.0)
Hemoglobin: 12.7 g/dL (ref 12.0–15.0)
MCH: 33 pg (ref 26.0–34.0)
MCHC: 33.2 g/dL (ref 30.0–36.0)
MCV: 99.5 fL (ref 80.0–100.0)
Platelets: 274 10*3/uL (ref 150–400)
RBC: 3.85 MIL/uL — ABNORMAL LOW (ref 3.87–5.11)
RDW: 13.2 % (ref 11.5–15.5)
WBC: 4.4 10*3/uL (ref 4.0–10.5)
nRBC: 0 % (ref 0.0–0.2)

## 2022-04-08 LAB — GLUCOSE, CAPILLARY: Glucose-Capillary: 118 mg/dL — ABNORMAL HIGH (ref 70–99)

## 2022-04-08 LAB — HEMOGLOBIN A1C
Hgb A1c MFr Bld: 5.3 % (ref 4.8–5.6)
Mean Plasma Glucose: 105 mg/dL

## 2022-04-08 LAB — SURGICAL PCR SCREEN
MRSA, PCR: NEGATIVE
Staphylococcus aureus: NEGATIVE

## 2022-04-08 NOTE — Progress Notes (Signed)
Anesthesia Chart Review   Case: [id number] Date/Time: 04/14/22 0700   Procedure: REVERSE SHOULDER ARTHROPLASTY (Right: Shoulder)   Anesthesia type: General   Pre-op diagnosis: RIGHT SHOULDER DJD   Location: [facility] ROOM 10 / [facility]   Surgeons: Hiram Gash, MD       DISCUSSION:56 y.o. never smoker with h/o HTN, sleep apnea, asthma, seizures, right shoulder djd scheduled for above procedure 04/14/2022 with Dr. Ophelia Charter.   Pt last seen by cardiology 03/03/2022. Per OV note, "Based on her normal echo and prior normal Myoview with lack of symptoms, she is cleared for upcoming orthopedic surgery."   VS: BP 138/84 Comment: right arm sitting  Pulse 90   Temp 36.9 C (Oral)   Resp (!) 22   Ht '5\' 4"'$  (1.626 m)   Wt 111.1 kg   SpO2 98%   BMI 42.05 kg/m   PROVIDERS: [name], MD is PCP   Quay Burow, MD is Cardiologist  LABS: Labs reviewed: Acceptable for surgery. (all labs ordered are listed, but only abnormal results are displayed)  Labs Reviewed  BASIC METABOLIC PANEL - Abnormal; Notable for the following components:      Result Value   Glucose, Bld 108 (*)    All other components within normal limits  CBC - Abnormal; Notable for the following components:   RBC 3.85 (*)    All other components within normal limits  GLUCOSE, CAPILLARY - Abnormal; Notable for the following components:   Glucose-Capillary 118 (*)    All other components within normal limits  SURGICAL PCR SCREEN  HEMOGLOBIN A1C     IMAGES:   EKG:   CV: Echo 08/26/2019  1. Left ventricular ejection fraction, by estimation, is 55 to 60%. The  left ventricle has normal function. The left ventricle has no regional  wall motion abnormalities. Left ventricular diastolic parameters were  normal.   2. Right ventricular systolic function is normal. The right ventricular  size is normal. There is normal pulmonary artery systolic pressure.   3. The mitral valve is normal in structure. Mild mitral valve   regurgitation.   4. The aortic valve is normal in structure. Aortic valve regurgitation is  not visualized.  Past Medical History:  Diagnosis Date   Anxiety    Arthritis    Phreesia 02/08/2020   Asthma    mild intermittent   Asthma    Phreesia 02/08/2020   Bipolar 1 disorder ([place])    ect treatments last treatment Sep 02 1011   Depression    Depression    Phreesia 02/08/2020   Depression    Phreesia 07/10/2020   GERD (gastroesophageal reflux disease)    Headache    Hypertension    Pre-diabetes    Seizures ([place])    Thinks it was from taking Tramadol   Sleep apnea    wears CPAP, uncertain of setting   Substance abuse ([place])    Phreesia 02/08/2020    Past Surgical History:  Procedure Laterality Date   ABDOMINAL HYSTERECTOMY     AMPUTATION TOE Left 02/02/2018   Procedure: AMPUTATION TOE Left 4th toe;  Surgeon: [name], DPM;  Location: [place];  Service: Podiatry;  Laterality: Left;   APPENDECTOMY N/A    Phreesia 02/08/2020   BACK SURGERY  2018   ACDF   C5-6 & C6-7 by Dr. Rennis Harding   CARPAL TUNNEL RELEASE     x2   [place] Right 10/25/2020   Procedure: IRRIGATION AND  DEBRIDEMENT ABSCESS OF FOOT AND APPLICATION OF GRAFT;  Surgeon: [name], DPM;  Location: [place];  Service: Podiatry;  Laterality: Right;   Laproscopic knee surgery Right    NECK SURGERY  03/15/2017   ACDF   by Dr. 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   Procedure: TRANSMETATARSAL AMPUTATION;  Surgeon: [name], DPM;  [facility];  Service: Podiatry;  Laterality: Right;    MEDICATIONS:  albuterol (PROAIR HFA) 108 (90 Base) MCG/ACT inhaler   busPIRone (BUSPAR) 15 MG tablet   Calcium Carbonate-Vit D-Min (CALCIUM 1200 PO)   clindamycin (CLEOCIN) 300 MG capsule   cyclobenzaprine (FLEXERIL) 10 MG tablet   DULoxetine (CYMBALTA) 60 MG capsule   Erenumab-aooe (AIMOVIG) 70 MG/ML SOAJ   escitalopram  (LEXAPRO) 10 MG tablet   estradiol (ESTRACE) 1 MG tablet   famotidine (PEPCID) 40 MG tablet   fluticasone (FLONASE) 50 MCG/ACT nasal spray   haloperidol (HALDOL) 5 MG tablet   lamoTRIgine (LAMICTAL) 100 MG tablet   levocetirizine (XYZAL) 5 MG tablet   lidocaine (LIDODERM) 5 %   LINZESS 290 MCG CAPS capsule   meloxicam (MOBIC) 15 MG tablet   metoprolol succinate (TOPROL-XL) 25 MG 24 hr tablet   MOVANTIK 25 MG TABS tablet   Multiple Vitamins-Minerals (MULTIVITAMIN WITH MINERALS) tablet   naloxone (NARCAN) nasal spray 4 mg/0.1 mL   ondansetron (ZOFRAN) 4 MG tablet   oxyCODONE-acetaminophen (PERCOCET) 7.5-325 MG tablet   pantoprazole (PROTONIX) 40 MG tablet   prazosin (MINIPRESS) 2 MG capsule   pregabalin (LYRICA) 100 MG capsule   promethazine (PHENERGAN) 25 MG tablet   solifenacin (VESICARE) 10 MG tablet   spironolactone (ALDACTONE) 50 MG tablet   sulfamethoxazole-trimethoprim (BACTRIM DS) 800-160 MG tablet   sulfamethoxazole-trimethoprim (BACTRIM DS) 800-160 MG tablet   SUMAtriptan (IMITREX) 50 MG tablet   topiramate (TOPAMAX) 50 MG tablet   traZODone (DESYREL) 100 MG tablet   No current facility-administered medications for this encounter.    Konrad Felix Ward, PA-C [facility]Surgical [facility] [phone]

## 2022-04-09 ENCOUNTER — Ambulatory Visit: Payer: Medicare Other | Admitting: Physical Therapy

## 2022-04-09 DIAGNOSIS — M75121 Complete rotator cuff tear or rupture of right shoulder, not specified as traumatic: Secondary | ICD-10-CM | POA: Diagnosis not present

## 2022-04-09 DIAGNOSIS — M25611 Stiffness of right shoulder, not elsewhere classified: Secondary | ICD-10-CM | POA: Diagnosis not present

## 2022-04-09 DIAGNOSIS — M6281 Muscle weakness (generalized): Secondary | ICD-10-CM | POA: Diagnosis not present

## 2022-04-11 NOTE — H&P (Signed)
PREOPERATIVE H&P  Chief Complaint: RIGHT SHOULDER DJD  HPI: [name] [name] is a 56 y.o. female who presents for preoperative history and physical prior to scheduled surgery, Procedure(s): REVERSE SHOULDER ARTHROPLASTY.   Patient has a past medical history significant for Bipolar, GERD, hypertension, pre-diabetes, sleep apnea on C-PAP, chronic pain and coronary artery disease. She sees Dr. Quay Burow   The patient is a 56 year old who is coming to [place] for right shoulder pain which has been going on since about September 2022. She had an injury in September and then another one in December where she felt a pop in her right shoulder. She has been in her sling since then. She is having severe pain in her right shoulder. She is very limited. She is frustrated by how her shoulder feels  Symptoms are rated as moderate to severe, and have been worsening.  This is significantly impairing activities of daily living.    Please see clinic note for further details on this patient's care.    She has elected for surgical management.   Past Medical History:  Diagnosis Date   Anxiety    Arthritis    Phreesia 02/08/2020   Asthma    mild intermittent   Asthma    Phreesia 02/08/2020   Bipolar 1 disorder ([place])    ect treatments last treatment Sep 02 1011   Depression    Depression    Phreesia 02/08/2020   Depression    Phreesia 07/10/2020   GERD (gastroesophageal reflux disease)    Headache    Hypertension    Pre-diabetes    Seizures ([place])    Thinks it was from taking Tramadol   Sleep apnea    wears CPAP, uncertain of setting   Substance abuse ([place])    Phreesia 02/08/2020   Past Surgical History:  Procedure Laterality Date   ABDOMINAL HYSTERECTOMY     AMPUTATION TOE Left 02/02/2018   Procedure: AMPUTATION TOE Left 4th toe;  Surgeon: [name], DPM;  Location: [place];  Service: Podiatry;  Laterality: Left;   APPENDECTOMY N/A    Phreesia 02/08/2020   BACK SURGERY   2018   ACDF   C5-6 & C6-7 by Dr. Rennis Harding   CARPAL TUNNEL RELEASE     x2   [place] Right 10/25/2020   Procedure: IRRIGATION AND DEBRIDEMENT ABSCESS OF FOOT AND APPLICATION OF GRAFT;  Surgeon: [name], DPM;  Location: [place];  Service: Podiatry;  Laterality: Right;   Laproscopic knee surgery Right    NECK SURGERY  03/15/2017   ACDF   by Dr. 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   Procedure: TRANSMETATARSAL AMPUTATION;  Surgeon: [name], DPM;  Location: [facility];  Service: Podiatry;  Laterality: Right;   Social History   Socioeconomic History   Marital status: Widowed    Spouse name: Not on file   Number of children: 3   Years of education: 33   Highest education level: Not on file  Occupational History   Occupation: Disabled  Tobacco Use   Smoking status: Never   Smokeless tobacco: Never  Vaping Use   Vaping Use: Never used  Substance and Sexual Activity   Alcohol use: No   Drug use: No   Sexual activity: Not Currently  Other Topics Concern   Not on file  Social History Narrative   Patient drink 4 16oz  caffeine drinks a day    Social Determinants of Health   Financial Resource Strain: Low Risk  (08/21/2020)   Overall Financial Resource Strain (CARDIA)    Difficulty of Paying Living Expenses: Not hard at all  Food Insecurity: No Food Insecurity (03/23/2022)   Hunger Vital Sign    Worried About Running Out of Food in the Last Year: Never true    Ran Out of Food in the Last Year: Never true  Transportation Needs: No Transportation Needs (03/23/2022)   PRAPARE - Hydrologist (Medical): No    Lack of Transportation (Non-Medical): No  Physical Activity: Inactive (08/21/2020)   Exercise Vital Sign    Days of Exercise per Week: 0 days    Minutes of Exercise per Session: 0 min  Stress: No Stress Concern Present (08/21/2020)   [place]    Feeling of Stress : Not at all  Social Connections: Socially Isolated (08/21/2020)   Social Connection and Isolation Panel [NHANES]    Frequency of Communication with Friends and Family: More than three times a week    Frequency of Social Gatherings with Friends and Family: Twice a week    Attends Religious Services: Never    Marine scientist or Organizations: No    Attends Archivist Meetings: Never    Marital Status: Widowed   Family History  Problem Relation Age of Onset   Diabetes Mother    COPD Mother    Hypertension Mother    Hyperlipidemia Mother    Sleep apnea Mother    Heart disease Father    Hyperlipidemia Father    Hypertension Father    Cancer Father    Sleep apnea Father    Heart disease Brother    Sleep apnea Brother    Heart disease Maternal Grandmother    Hypertension Maternal Grandmother    Sleep apnea Maternal Grandmother    Heart disease Maternal Grandfather    Kidney cancer Paternal Grandmother    Heart disease Paternal Grandfather    Heart disease Daughter    Allergies  Allergen Reactions   Other    Penicillins Hives and Rash    No anaphylactic or severe cutaneous Sx > 10 yr ago, no additional medical attention required Tolerates Keflex and Rocephin   Tetracyclines & Related Other (See Comments)    Syncope and put her "in a coma"   Tramadol Other (See Comments)    Seizures   Phenazopyridine Other (See Comments)    Unknown   Tetracycline Other (See Comments)    Syncope and "Put me in a coma"   Ciprofloxacin Rash and Itching   Codeine Itching and Rash   Estradiol Rash    Patches broke out the skin   Prior to Admission medications   Medication Sig Start Date End Date Taking? Authorizing Provider  albuterol (PROAIR HFA) 108 (90 Base) MCG/ACT inhaler Inhale 2 puffs = 158mg into the lungs every 6 (six) hours as needed for wheezing or shortness of breath. 07/11/20  Yes MNoemi ChapelA,  NP  busPIRone (BUSPAR) 15 MG tablet Take 15 mg by mouth 3 (three) times daily.  04/26/17  Yes [provider]  Calcium Carbonate-Vit D-Min (CALCIUM 1200 PO) Take 1,200 mg by mouth daily.    Yes [provider]  cyclobenzaprine (FLEXERIL) 10 MG tablet Take 10 mg by mouth 3 (three) times daily.   Yes [provider]  DULoxetine (CYMBALTA)  60 MG capsule TAKE 1 CAPSULE BY MOUTH EVERY DAY 06/29/21  Yes [name], MD  Erenumab-aooe (AIMOVIG) 70 MG/ML SOAJ administer 11m UNDER THE SKIN every 30 DAYS 02/08/22  Yes PSusy Frizzle MD  escitalopram (LEXAPRO) 10 MG tablet Take 10 mg by mouth daily. 08/23/19  Yes [provider]  estradiol (ESTRACE) 1 MG tablet Take 1 tablet (1 mg total) by mouth at bedtime. 07/27/21  Yes PSusy Frizzle MD  famotidine (PEPCID) 40 MG tablet Take 1 tablet (40 mg total) by mouth at bedtime. 05/28/21  Yes PSusy Frizzle MD  fluticasone (FLONASE) 50 MCG/ACT nasal spray SPRAY 2 SPRAYS INTRO EACH NOSTRIL EVERY DAY Patient taking differently: Place 2 sprays into both nostrils daily as needed for allergies. 12/18/20  Yes PSusy Frizzle MD  haloperidol (HALDOL) 5 MG tablet Take 1 tablet (5 mg total) by mouth at bedtime. For psychosis 11/15/15  Yes MBarton Dubois MD  lamoTRIgine (LAMICTAL) 100 MG tablet Take 100 mg by mouth daily. 08/06/21  Yes [provider]  levocetirizine (XYZAL) 5 MG tablet Take 1 tablet (5 mg total) by mouth every evening. 05/13/21  Yes PSusy Frizzle MD  lidocaine (LIDODERM) 5 % Place 1 patch onto the skin daily as needed (pain). Remove & Discard patch within 12 hours or as directed by MD   Yes [provider]  meloxicam (MOBIC) 15 MG tablet Take 15 mg by mouth at bedtime.  09/15/17  Yes [provider]  metoprolol succinate (TOPROL-XL) 25 MG 24 hr tablet Take 1 tablet (25 mg total) by mouth daily. 10/23/21  Yes BLorretta Harp MD  MOVANTIK 25 MG TABS tablet Take 25 mg by mouth daily.    Yes [provider]  Multiple Vitamins-Minerals (MULTIVITAMIN WITH MINERALS) tablet Take 1 tablet by mouth daily.   Yes [provider]  naloxone (NARCAN) nasal spray 4 mg/0.1 mL Place 0.4 mg into the nose once. 08/03/21  Yes [provider]  ondansetron (ZOFRAN) 4 MG tablet Take 1 tablet (4 mg total) by mouth every 8 (eight) hours as needed for nausea or vomiting. 07/03/21  Yes PSusy Frizzle MD  oxyCODONE-acetaminophen (PERCOCET) 7.5-325 MG tablet Take 1 tablet by mouth 5 (five) times daily.   Yes [provider]  pantoprazole (PROTONIX) 40 MG tablet Take 1 tablet (40 mg total) by mouth 2 (two) times daily. 02/02/22  Yes PSusy Frizzle MD  prazosin (MINIPRESS) 2 MG capsule Take 2 mg by mouth at bedtime.   Yes [provider]  pregabalin (LYRICA) 100 MG capsule Take 1 capsule (100 mg total) by mouth 2 (two) times daily. Patient taking differently: Take 100 mg by mouth 3 (three) times daily. 07/27/21  Yes PSusy Frizzle MD  solifenacin (VESICARE) 10 MG tablet Take 1 tablet (10 mg total) by mouth daily. 01/21/22  Yes PSusy Frizzle MD  spironolactone (ALDACTONE) 50 MG tablet TAKE ONE TABLET BY MOUTH DAILY 03/23/22  Yes PSusy Frizzle MD  sulfamethoxazole-trimethoprim (BACTRIM DS) 800-160 MG tablet Take 1 tablet by mouth 2 (two) times daily. 03/25/22  Yes WTrula Slade DPM  SUMAtriptan (IMITREX) 50 MG tablet TAKE 1 TAB EVERY 2 HOURS AS NEEDED FOR MIGRAINE. MAY REPEAT IN 2 HOURS IF PERSISTS OR RECURS 08/30/19  Yes PSusy Frizzle MD  topiramate (TOPAMAX) 50 MG tablet Take 1 tablet (50 mg total) by mouth 2 (two) times daily. 07/27/21  Yes PSusy Frizzle MD  traZODone (DESYREL) 100 MG tablet  Take 200 mg by mouth at bedtime as needed for sleep. 07/29/14  Yes [provider]  clindamycin (CLEOCIN) 300 MG capsule Take 1 capsule (300 mg total) by mouth 3 (three) times daily. Patient not taking: Reported on 04/05/2022 03/03/22    [name], DPM  LINZESS 290 MCG CAPS capsule TAKE ONE CAPSULE BY MOUTH EVERY MORNING Patient not taking: Reported on 04/06/2022 10/28/21   [name], MD  promethazine (PHENERGAN) 25 MG tablet Take 1 tablet (25 mg total) by mouth every 8 (eight) hours as needed for nausea or vomiting. Patient not taking: Reported on 04/06/2022 03/18/21   [name], DPM  sulfamethoxazole-trimethoprim (BACTRIM DS) 800-160 MG tablet Take 1 tablet by mouth 2 (two) times daily. Patient not taking: Reported on 04/06/2022 03/08/22   [name], DPM    ROS: All other systems have been reviewed and were otherwise negative with the exception of those mentioned in the HPI and as above.  Physical Exam: General: Alert, no acute distress Cardiovascular: No pedal edema Respiratory: No cyanosis, no use of accessory musculature GI: No organomegaly, abdomen is soft and non-tender Skin: No lesions in the area of chief complaint Neurologic: Sensation intact distally Psychiatric: Patient is competent for consent with normal mood and affect Lymphatic: No axillary or cervical lymphadenopathy  MUSCULOSKELETAL:  On examination active forward elevation to 60, external rotation to 30, internal rotation to the back pocket. Cuff strength is weak throughout.   Imaging: MRI of the right shoulder  demonstrates a large full thickness supraspinatus and infraspinatus tear.  There is a partial thickness tear of the subscapularis. There is tearing of  the biceps tendon.   BMI: Estimated body mass index is 42.05 kg/m as calculated from the following:   Height as of 04/08/22: '5\' 4"'$  (1.626 m).   Weight as of 04/08/22: 111.1 kg.  Lab Results  Component Value Date   ALBUMIN 3.5 03/15/2022   Diabetes: Patient does not have a diagnosis of diabetes. Lab Results  Component Value Date   HGBA1C 5.3 04/08/2022     Smoking Status:   reports that she has never smoked. She has never used smokeless  tobacco.    Assessment: RIGHT SHOULDER DJD  Plan: Plan for Procedure(s): REVERSE SHOULDER ARTHROPLASTY  She is at high risk of complication including stress fracture, infection, dislocation and continued pain. She has chronic pain medications written by her pain management clinic. She will continue to have those written by them. We talked about the risks, benefits and concerns of a reverse total shoulder arthroplasty. She understands and would like to proceed. We will work on surgical scheduling. We will get a CT for a blueprint protocol. We will not give her a sling today as she uses a walker. We will have to use flex implants to try and allow her to use the joint early. She will be kept overnight for observation and work on scheduling  The risks benefits and alternatives were discussed with the patient including but not limited to the risks of nonoperative treatment, versus surgical intervention including infection, bleeding, nerve injury,  blood clots, cardiopulmonary complications, morbidity, mortality, among others, and they were willing to proceed.   We additionally specifically discussed risks of axillary nerve injury, infection, periprosthetic fracture, continued pain and longevity of implants prior to beginning procedure.    Patient will be admitted for inpatient treatment for surgery, pain control, OT, prophylactic antibiotics, VTE prophylaxis, and discharge planning. The patient is planning to be discharged home  with outpatient PT.   The patient acknowledged the explanation, agreed to proceed with the plan and consent was signed.   Operative Plan: Right reverse total shoulder arthroplasty  Discharge Medications: Standard. Pain management per her pain management provider DVT Prophylaxis: aspirin Physical Therapy: outpatient PT Special Discharge needs: Sling. [place], PA-C  04/11/2022 2:33 PM

## 2022-04-13 NOTE — Anesthesia Preprocedure Evaluation (Signed)
Anesthesia Evaluation  Patient identified by MRN, date of birth, ID band Patient awake    Reviewed: Allergy & Precautions, H&P , NPO status , Patient's Chart, lab work & pertinent test results  Airway Mallampati: III  TM Distance: >3 FB Neck ROM: Full    Dental no notable dental hx. (+) Dental Advisory Given, Teeth Intact   Pulmonary shortness of breath, asthma , sleep apnea and Continuous Positive Airway Pressure Ventilation    Pulmonary exam normal breath sounds clear to auscultation       Cardiovascular hypertension, Pt. on medications and Pt. on home beta blockers + angina  Normal cardiovascular exam Rhythm:Regular Rate:Normal  Echo 2021  1. Left ventricular ejection fraction, by estimation, is 55 to 60%. The left ventricle has normal function. The left ventricle has no regional wall motion abnormalities. Left ventricular diastolic parameters were normal.   2. Right ventricular systolic function is normal. The right ventricular size is normal. There is normal pulmonary artery systolic pressure.   3. The mitral valve is normal in structure. Mild mitral valve regurgitation.   4. The aortic valve is normal in structure. Aortic valve regurgitation is not visualized.     Neuro/Psych  Headaches, Seizures -,  PSYCHIATRIC DISORDERS Anxiety Depression Bipolar Disorder      GI/Hepatic Neg liver ROS,GERD  ,,  Endo/Other    Morbid obesity  Renal/GU Renal disease  negative genitourinary   Musculoskeletal  (+) Arthritis , Osteoarthritis,    Abdominal  (+) + obese  Peds  Hematology negative hematology ROS (+)   Anesthesia Other Findings   Reproductive/Obstetrics negative OB ROS                             Anesthesia Physical Anesthesia Plan  ASA: 3  Anesthesia Plan: General   Post-op Pain Management: Tylenol PO (pre-op)* and Regional block*   Induction: Intravenous  PONV Risk Score and Plan: 3  and Ondansetron, Dexamethasone, Midazolam and Treatment may vary due to age or medical condition  Airway Management Planned: Oral ETT  Additional Equipment:   Intra-op Plan:   Post-operative Plan: Extubation in OR  Informed Consent: I have reviewed the patients History and Physical, chart, labs and discussed the procedure including the risks, benefits and alternatives for the proposed anesthesia with the patient or authorized representative who has indicated his/her understanding and acceptance.     Dental advisory given  Plan Discussed with: CRNA  Anesthesia Plan Comments:         Anesthesia Quick Evaluation

## 2022-04-14 ENCOUNTER — Ambulatory Visit (HOSPITAL_COMMUNITY): Payer: Medicare Other

## 2022-04-14 ENCOUNTER — Other Ambulatory Visit: Payer: Self-pay

## 2022-04-14 ENCOUNTER — Ambulatory Visit (HOSPITAL_BASED_OUTPATIENT_CLINIC_OR_DEPARTMENT_OTHER): Payer: Medicare Other | Admitting: Physician Assistant

## 2022-04-14 ENCOUNTER — Ambulatory Visit (HOSPITAL_COMMUNITY)
Admission: RE | Admit: 2022-04-14 | Discharge: 2022-04-15 | Disposition: A | Discharge status: Home / Self Care | Payer: Medicare Other | Attending: Orthopaedic Surgery | Admitting: Orthopaedic Surgery

## 2022-04-14 ENCOUNTER — Encounter (HOSPITAL_COMMUNITY)
Admission: RE | Disposition: A | Discharge status: Home / Self Care | Payer: Self-pay | Source: Home / Self Care | Attending: Orthopaedic Surgery

## 2022-04-14 ENCOUNTER — Encounter (HOSPITAL_COMMUNITY): Payer: Self-pay | Admitting: Orthopaedic Surgery

## 2022-04-14 ENCOUNTER — Ambulatory Visit (HOSPITAL_COMMUNITY): Payer: Medicare Other | Admitting: Physician Assistant

## 2022-04-14 DIAGNOSIS — I209 Angina pectoris, unspecified: Secondary | ICD-10-CM

## 2022-04-14 DIAGNOSIS — M19011 Primary osteoarthritis, right shoulder: Secondary | ICD-10-CM | POA: Diagnosis not present

## 2022-04-14 DIAGNOSIS — G8929 Other chronic pain: Secondary | ICD-10-CM | POA: Insufficient documentation

## 2022-04-14 DIAGNOSIS — G8918 Other acute postprocedural pain: Secondary | ICD-10-CM | POA: Diagnosis not present

## 2022-04-14 DIAGNOSIS — Z79899 Other long term (current) drug therapy: Secondary | ICD-10-CM | POA: Insufficient documentation

## 2022-04-14 DIAGNOSIS — G4733 Obstructive sleep apnea (adult) (pediatric): Secondary | ICD-10-CM | POA: Diagnosis not present

## 2022-04-14 DIAGNOSIS — M25511 Pain in right shoulder: Secondary | ICD-10-CM | POA: Diagnosis not present

## 2022-04-14 DIAGNOSIS — F419 Anxiety disorder, unspecified: Secondary | ICD-10-CM | POA: Diagnosis not present

## 2022-04-14 DIAGNOSIS — K219 Gastro-esophageal reflux disease without esophagitis: Secondary | ICD-10-CM | POA: Insufficient documentation

## 2022-04-14 DIAGNOSIS — I25119 Atherosclerotic heart disease of native coronary artery with unspecified angina pectoris: Secondary | ICD-10-CM | POA: Insufficient documentation

## 2022-04-14 DIAGNOSIS — M75101 Unspecified rotator cuff tear or rupture of right shoulder, not specified as traumatic: Secondary | ICD-10-CM

## 2022-04-14 DIAGNOSIS — F319 Bipolar disorder, unspecified: Secondary | ICD-10-CM | POA: Diagnosis not present

## 2022-04-14 DIAGNOSIS — R519 Headache, unspecified: Secondary | ICD-10-CM | POA: Diagnosis not present

## 2022-04-14 DIAGNOSIS — R569 Unspecified convulsions: Secondary | ICD-10-CM | POA: Diagnosis not present

## 2022-04-14 DIAGNOSIS — S46011A Strain of muscle(s) and tendon(s) of the rotator cuff of right shoulder, initial encounter: Secondary | ICD-10-CM | POA: Diagnosis not present

## 2022-04-14 DIAGNOSIS — R7303 Prediabetes: Secondary | ICD-10-CM

## 2022-04-14 DIAGNOSIS — X58XXXA Exposure to other specified factors, initial encounter: Secondary | ICD-10-CM | POA: Insufficient documentation

## 2022-04-14 DIAGNOSIS — Z6841 Body Mass Index (BMI) 40.0 and over, adult: Secondary | ICD-10-CM | POA: Insufficient documentation

## 2022-04-14 DIAGNOSIS — I10 Essential (primary) hypertension: Secondary | ICD-10-CM | POA: Diagnosis not present

## 2022-04-14 DIAGNOSIS — J45909 Unspecified asthma, uncomplicated: Secondary | ICD-10-CM | POA: Insufficient documentation

## 2022-04-14 DIAGNOSIS — Z9989 Dependence on other enabling machines and devices: Secondary | ICD-10-CM | POA: Diagnosis not present

## 2022-04-14 DIAGNOSIS — G4730 Sleep apnea, unspecified: Secondary | ICD-10-CM | POA: Insufficient documentation

## 2022-04-14 DIAGNOSIS — M25561 Pain in right knee: Secondary | ICD-10-CM | POA: Diagnosis not present

## 2022-04-14 DIAGNOSIS — M961 Postlaminectomy syndrome, not elsewhere classified: Secondary | ICD-10-CM | POA: Diagnosis not present

## 2022-04-14 DIAGNOSIS — Z96611 Presence of right artificial shoulder joint: Secondary | ICD-10-CM | POA: Diagnosis not present

## 2022-04-14 DIAGNOSIS — M1990 Unspecified osteoarthritis, unspecified site: Secondary | ICD-10-CM | POA: Insufficient documentation

## 2022-04-14 DIAGNOSIS — Z471 Aftercare following joint replacement surgery: Secondary | ICD-10-CM | POA: Diagnosis not present

## 2022-04-14 DIAGNOSIS — M25552 Pain in left hip: Secondary | ICD-10-CM | POA: Diagnosis not present

## 2022-04-14 HISTORY — PX: REVERSE SHOULDER ARTHROPLASTY: SHX5054

## 2022-04-14 SURGERY — ARTHROPLASTY, SHOULDER, TOTAL, REVERSE
Anesthesia: General | Site: Shoulder | Laterality: Right

## 2022-04-14 MED ORDER — CEFAZOLIN SODIUM-DEXTROSE 2-4 GM/100ML-% IV SOLN
2.0000 g | INTRAVENOUS | Status: AC
  Administered 2022-04-14: 2 g via INTRAVENOUS
  Filled 2022-04-14: qty 100

## 2022-04-14 MED ORDER — TRAZODONE HCL 100 MG PO TABS: 200.0000 mg | ORAL_TABLET | Freq: Every evening | ORAL | Status: DC | PRN

## 2022-04-14 MED ORDER — KETOROLAC TROMETHAMINE 15 MG/ML IJ SOLN
15.0000 mg | Freq: Four times a day (QID) | INTRAMUSCULAR | Status: AC
  Administered 2022-04-14 – 2022-04-15 (×4): 15 mg via INTRAVENOUS
  Filled 2022-04-14 (×4): qty 1

## 2022-04-14 MED ORDER — FAMOTIDINE 20 MG PO TABS
40.0000 mg | ORAL_TABLET | Freq: Every day | ORAL | Status: DC
  Administered 2022-04-14: 40 mg via ORAL
  Filled 2022-04-14: qty 2

## 2022-04-14 MED ORDER — EPHEDRINE 5 MG/ML INJ
INTRAVENOUS | Status: AC
  Filled 2022-04-14: qty 5

## 2022-04-14 MED ORDER — VANCOMYCIN HCL 1000 MG IV SOLR
INTRAVENOUS | Status: AC
  Filled 2022-04-14: qty 20

## 2022-04-14 MED ORDER — LIDOCAINE 2% (20 MG/ML) 5 ML SYRINGE
INTRAMUSCULAR | Status: DC | PRN
  Administered 2022-04-14: 100 mg via INTRAVENOUS

## 2022-04-14 MED ORDER — 0.9 % SODIUM CHLORIDE (POUR BTL) OPTIME
TOPICAL | Status: DC | PRN
  Administered 2022-04-14: 1000 mL

## 2022-04-14 MED ORDER — PROPOFOL 10 MG/ML IV BOLUS
INTRAVENOUS | Status: DC | PRN
  Administered 2022-04-14: 150 mg via INTRAVENOUS

## 2022-04-14 MED ORDER — METOCLOPRAMIDE HCL 5 MG/ML IJ SOLN: 5.0000 mg | Freq: Three times a day (TID) | INTRAMUSCULAR | Status: DC | PRN

## 2022-04-14 MED ORDER — SPIRONOLACTONE 25 MG PO TABS
50.0000 mg | ORAL_TABLET | Freq: Every day | ORAL | Status: DC
  Administered 2022-04-14 – 2022-04-15 (×2): 50 mg via ORAL
  Filled 2022-04-14 (×2): qty 2

## 2022-04-14 MED ORDER — PROPOFOL 10 MG/ML IV BOLUS
INTRAVENOUS | Status: AC
  Filled 2022-04-14: qty 20

## 2022-04-14 MED ORDER — FENTANYL CITRATE PF 50 MCG/ML IJ SOSY: 25.0000 ug | PREFILLED_SYRINGE | INTRAMUSCULAR | Status: DC | PRN

## 2022-04-14 MED ORDER — MIDAZOLAM HCL 2 MG/2ML IJ SOLN
INTRAMUSCULAR | Status: AC
  Filled 2022-04-14: qty 2

## 2022-04-14 MED ORDER — ONDANSETRON HCL 4 MG PO TABS: 4.0000 mg | ORAL_TABLET | Freq: Four times a day (QID) | ORAL | Status: DC | PRN

## 2022-04-14 MED ORDER — MEPERIDINE HCL 50 MG/ML IJ SOLN: 6.2500 mg | INTRAMUSCULAR | Status: DC | PRN

## 2022-04-14 MED ORDER — ACETAMINOPHEN 500 MG PO TABS
1000.0000 mg | ORAL_TABLET | Freq: Once | ORAL | Status: DC
  Filled 2022-04-14: qty 2

## 2022-04-14 MED ORDER — TRANEXAMIC ACID-NACL 1000-0.7 MG/100ML-% IV SOLN
1000.0000 mg | INTRAVENOUS | Status: AC
  Administered 2022-04-14: 1000 mg via INTRAVENOUS
  Filled 2022-04-14: qty 100

## 2022-04-14 MED ORDER — LEVOCETIRIZINE DIHYDROCHLORIDE 5 MG PO TABS: 5.0000 mg | ORAL_TABLET | Freq: Every evening | ORAL | Status: DC

## 2022-04-14 MED ORDER — LORATADINE 10 MG PO TABS
10.0000 mg | ORAL_TABLET | Freq: Every evening | ORAL | Status: DC
  Administered 2022-04-14: 10 mg via ORAL
  Filled 2022-04-14: qty 1

## 2022-04-14 MED ORDER — ESCITALOPRAM OXALATE 10 MG PO TABS
10.0000 mg | ORAL_TABLET | Freq: Every day | ORAL | Status: DC
  Administered 2022-04-15: 10 mg via ORAL
  Filled 2022-04-14: qty 1

## 2022-04-14 MED ORDER — LIDOCAINE HCL (PF) 2 % IJ SOLN
INTRAMUSCULAR | Status: AC
  Filled 2022-04-14: qty 5

## 2022-04-14 MED ORDER — BUSPIRONE HCL 5 MG PO TABS
15.0000 mg | ORAL_TABLET | Freq: Three times a day (TID) | ORAL | Status: DC
  Administered 2022-04-14 – 2022-04-15 (×3): 15 mg via ORAL
  Filled 2022-04-14 (×4): qty 1

## 2022-04-14 MED ORDER — MORPHINE SULFATE (PF) 2 MG/ML IV SOLN
0.5000 mg | INTRAVENOUS | Status: DC | PRN
  Administered 2022-04-15: 1 mg via INTRAVENOUS
  Filled 2022-04-14: qty 1

## 2022-04-14 MED ORDER — ESTRADIOL 0.5 MG PO TABS
1.0000 mg | ORAL_TABLET | Freq: Every day | ORAL | Status: DC
  Administered 2022-04-14: 1 mg via ORAL
  Filled 2022-04-14: qty 2

## 2022-04-14 MED ORDER — SUMATRIPTAN SUCCINATE 50 MG PO TABS: 50.0000 mg | ORAL_TABLET | ORAL | Status: DC | PRN

## 2022-04-14 MED ORDER — MENTHOL 3 MG MT LOZG: 1.0000 | LOZENGE | OROMUCOSAL | Status: DC | PRN

## 2022-04-14 MED ORDER — ORAL CARE MOUTH RINSE: 15.0000 mL | Freq: Once | OROMUCOSAL | Status: AC

## 2022-04-14 MED ORDER — STERILE WATER FOR IRRIGATION IR SOLN
Status: DC | PRN
  Administered 2022-04-14: 2000 mL

## 2022-04-14 MED ORDER — PROMETHAZINE HCL 25 MG/ML IJ SOLN: 6.2500 mg | INTRAMUSCULAR | Status: DC | PRN

## 2022-04-14 MED ORDER — PHENYLEPHRINE HCL-NACL 20-0.9 MG/250ML-% IV SOLN
INTRAVENOUS | Status: DC | PRN
  Administered 2022-04-14: 20 ug/min via INTRAVENOUS

## 2022-04-14 MED ORDER — CEFAZOLIN SODIUM-DEXTROSE 2-4 GM/100ML-% IV SOLN
2.0000 g | Freq: Four times a day (QID) | INTRAVENOUS | Status: AC
  Administered 2022-04-14 – 2022-04-15 (×3): 2 g via INTRAVENOUS
  Filled 2022-04-14 (×3): qty 100

## 2022-04-14 MED ORDER — MIDAZOLAM HCL 5 MG/5ML IJ SOLN
INTRAMUSCULAR | Status: DC | PRN
  Administered 2022-04-14: 2 mg via INTRAVENOUS

## 2022-04-14 MED ORDER — ACETAMINOPHEN 500 MG PO TABS
500.0000 mg | ORAL_TABLET | Freq: Four times a day (QID) | ORAL | Status: AC
  Administered 2022-04-14 – 2022-04-15 (×2): 500 mg via ORAL
  Filled 2022-04-14 (×2): qty 1

## 2022-04-14 MED ORDER — MAGNESIUM CITRATE PO SOLN: 1.0000 | Freq: Once | ORAL | Status: DC | PRN

## 2022-04-14 MED ORDER — DULOXETINE HCL 60 MG PO CPEP
60.0000 mg | ORAL_CAPSULE | Freq: Every day | ORAL | Status: DC
  Administered 2022-04-14 – 2022-04-15 (×2): 60 mg via ORAL
  Filled 2022-04-14 (×2): qty 1

## 2022-04-14 MED ORDER — LACTATED RINGERS IV SOLN: INTRAVENOUS | Status: DC

## 2022-04-14 MED ORDER — DEXAMETHASONE SODIUM PHOSPHATE 10 MG/ML IJ SOLN
INTRAMUSCULAR | Status: AC
  Filled 2022-04-14: qty 1

## 2022-04-14 MED ORDER — PHENOL 1.4 % MT LIQD: 1.0000 | OROMUCOSAL | Status: DC | PRN

## 2022-04-14 MED ORDER — OXYCODONE-ACETAMINOPHEN 7.5-325 MG PO TABS
1.0000 | ORAL_TABLET | Freq: Every day | ORAL | Status: DC
  Administered 2022-04-14 – 2022-04-15 (×6): 1 via ORAL
  Filled 2022-04-14 (×6): qty 1

## 2022-04-14 MED ORDER — FENTANYL CITRATE (PF) 100 MCG/2ML IJ SOLN
INTRAMUSCULAR | Status: DC | PRN
  Administered 2022-04-14 (×2): 50 ug via INTRAVENOUS

## 2022-04-14 MED ORDER — ONDANSETRON HCL 4 MG/2ML IJ SOLN
INTRAMUSCULAR | Status: AC
  Filled 2022-04-14: qty 2

## 2022-04-14 MED ORDER — PREGABALIN 100 MG PO CAPS
100.0000 mg | ORAL_CAPSULE | Freq: Three times a day (TID) | ORAL | Status: DC
  Administered 2022-04-14 (×2): 100 mg via ORAL
  Filled 2022-04-14 (×3): qty 1

## 2022-04-14 MED ORDER — PANTOPRAZOLE SODIUM 40 MG PO TBEC
40.0000 mg | DELAYED_RELEASE_TABLET | Freq: Two times a day (BID) | ORAL | Status: DC
  Administered 2022-04-14 – 2022-04-15 (×2): 40 mg via ORAL
  Filled 2022-04-14 (×2): qty 1

## 2022-04-14 MED ORDER — FLUTICASONE PROPIONATE 50 MCG/ACT NA SUSP: 2.0000 | Freq: Every day | NASAL | Status: DC | PRN

## 2022-04-14 MED ORDER — HALOPERIDOL 5 MG PO TABS
5.0000 mg | ORAL_TABLET | Freq: Every day | ORAL | Status: DC
  Administered 2022-04-14: 5 mg via ORAL
  Filled 2022-04-14: qty 1

## 2022-04-14 MED ORDER — PRAZOSIN HCL 1 MG PO CAPS
2.0000 mg | ORAL_CAPSULE | Freq: Every day | ORAL | Status: DC
  Administered 2022-04-14: 2 mg via ORAL
  Filled 2022-04-14: qty 2

## 2022-04-14 MED ORDER — DEXAMETHASONE SODIUM PHOSPHATE 10 MG/ML IJ SOLN
INTRAMUSCULAR | Status: DC | PRN
  Administered 2022-04-14: 8 mg via INTRAVENOUS

## 2022-04-14 MED ORDER — ONDANSETRON HCL 4 MG/2ML IJ SOLN
INTRAMUSCULAR | Status: DC | PRN
  Administered 2022-04-14: 4 mg via INTRAVENOUS

## 2022-04-14 MED ORDER — ROCURONIUM BROMIDE 10 MG/ML (PF) SYRINGE
PREFILLED_SYRINGE | INTRAVENOUS | Status: AC
  Filled 2022-04-14: qty 10

## 2022-04-14 MED ORDER — SODIUM CHLORIDE 0.9 % IR SOLN
Status: DC | PRN
  Administered 2022-04-14: 1000 mL

## 2022-04-14 MED ORDER — ROCURONIUM BROMIDE 10 MG/ML (PF) SYRINGE
PREFILLED_SYRINGE | INTRAVENOUS | Status: DC | PRN
  Administered 2022-04-14: 60 mg via INTRAVENOUS

## 2022-04-14 MED ORDER — POLYETHYLENE GLYCOL 3350 17 G PO PACK: 17.0000 g | PACK | Freq: Every day | ORAL | Status: DC | PRN

## 2022-04-14 MED ORDER — ALBUTEROL SULFATE (2.5 MG/3ML) 0.083% IN NEBU: 3.0000 mL | INHALATION_SOLUTION | Freq: Four times a day (QID) | RESPIRATORY_TRACT | Status: DC | PRN

## 2022-04-14 MED ORDER — EPHEDRINE SULFATE-NACL 50-0.9 MG/10ML-% IV SOSY
PREFILLED_SYRINGE | INTRAVENOUS | Status: DC | PRN
  Administered 2022-04-14 (×2): 5 mg via INTRAVENOUS

## 2022-04-14 MED ORDER — SUGAMMADEX SODIUM 200 MG/2ML IV SOLN
INTRAVENOUS | Status: DC | PRN
  Administered 2022-04-14: 200 mg via INTRAVENOUS

## 2022-04-14 MED ORDER — BUPIVACAINE LIPOSOME 1.3 % IJ SUSP
INTRAMUSCULAR | Status: DC | PRN
  Administered 2022-04-14: 10 mL via PERINEURAL

## 2022-04-14 MED ORDER — METOPROLOL SUCCINATE ER 25 MG PO TB24
25.0000 mg | ORAL_TABLET | Freq: Every day | ORAL | Status: DC
  Administered 2022-04-15: 25 mg via ORAL
  Filled 2022-04-14: qty 1

## 2022-04-14 MED ORDER — TOPIRAMATE 25 MG PO TABS: 50.0000 mg | ORAL_TABLET | Freq: Two times a day (BID) | ORAL | Status: DC

## 2022-04-14 MED ORDER — BISACODYL 10 MG RE SUPP: 10.0000 mg | Freq: Every day | RECTAL | Status: DC | PRN

## 2022-04-14 MED ORDER — CHLORHEXIDINE GLUCONATE 0.12 % MT SOLN
15.0000 mL | Freq: Once | OROMUCOSAL | Status: AC
  Administered 2022-04-14: 15 mL via OROMUCOSAL

## 2022-04-14 MED ORDER — ACETAMINOPHEN 500 MG PO TABS
1000.0000 mg | ORAL_TABLET | Freq: Once | ORAL | Status: AC
  Administered 2022-04-14: 1000 mg via ORAL
  Filled 2022-04-14: qty 2

## 2022-04-14 MED ORDER — DOCUSATE SODIUM 100 MG PO CAPS
100.0000 mg | ORAL_CAPSULE | Freq: Two times a day (BID) | ORAL | Status: DC
  Administered 2022-04-14 – 2022-04-15 (×3): 100 mg via ORAL
  Filled 2022-04-14 (×3): qty 1

## 2022-04-14 MED ORDER — FENTANYL CITRATE (PF) 100 MCG/2ML IJ SOLN
INTRAMUSCULAR | Status: AC
  Filled 2022-04-14: qty 2

## 2022-04-14 MED ORDER — BUPIVACAINE HCL (PF) 0.5 % IJ SOLN
INTRAMUSCULAR | Status: DC | PRN
  Administered 2022-04-14: 10 mL via PERINEURAL

## 2022-04-14 MED ORDER — DIPHENHYDRAMINE HCL 12.5 MG/5ML PO ELIX: 12.5000 mg | ORAL_SOLUTION | ORAL | Status: DC | PRN

## 2022-04-14 MED ORDER — METOCLOPRAMIDE HCL 5 MG PO TABS: 5.0000 mg | ORAL_TABLET | Freq: Three times a day (TID) | ORAL | Status: DC | PRN

## 2022-04-14 MED ORDER — FESOTERODINE FUMARATE ER 4 MG PO TB24
4.0000 mg | ORAL_TABLET | Freq: Every day | ORAL | Status: DC
  Administered 2022-04-15: 4 mg via ORAL
  Filled 2022-04-14: qty 1

## 2022-04-14 MED ORDER — ONDANSETRON HCL 4 MG/2ML IJ SOLN
4.0000 mg | Freq: Four times a day (QID) | INTRAMUSCULAR | Status: DC | PRN
  Administered 2022-04-14: 4 mg via INTRAVENOUS
  Filled 2022-04-14: qty 2

## 2022-04-14 MED ORDER — CYCLOBENZAPRINE HCL 10 MG PO TABS
10.0000 mg | ORAL_TABLET | Freq: Three times a day (TID) | ORAL | Status: DC
  Administered 2022-04-14 – 2022-04-15 (×4): 10 mg via ORAL
  Filled 2022-04-14 (×4): qty 1

## 2022-04-14 MED ORDER — VANCOMYCIN HCL 1 G IV SOLR
INTRAVENOUS | Status: DC | PRN
  Administered 2022-04-14: 1000 mg

## 2022-04-14 MED ORDER — LAMOTRIGINE 100 MG PO TABS
100.0000 mg | ORAL_TABLET | Freq: Every day | ORAL | Status: DC
  Administered 2022-04-15: 100 mg via ORAL
  Filled 2022-04-14: qty 1

## 2022-04-14 SURGICAL SUPPLY — 68 items
AID PSTN UNV HD RSTRNT DISP (MISCELLANEOUS) ×1
APL PRP STRL LF DISP 70% ISPRP (MISCELLANEOUS) ×2
AUG BASEPLATE 15DEG 25 WEDGE (Joint) ×1 IMPLANT
AUGMENT BASEPLATE 15DEG 25 WDG (Joint) IMPLANT
BAG COUNTER SPONGE SURGICOUNT (BAG) ×2 IMPLANT
BAG SPNG CNTER NS LX DISP (BAG) ×1
BIT DRILL 3.2 PERIPHERAL SCREW (BIT) IMPLANT
BLADE SAW SGTL 73X25 THK (BLADE) ×2 IMPLANT
BSPLAT GLND 15D 25 FULL WDG (Joint) ×1 IMPLANT
CHLORAPREP W/TINT 26 (MISCELLANEOUS) ×4 IMPLANT
CLSR STERI-STRIP ANTIMIC 1/2X4 (GAUZE/BANDAGES/DRESSINGS) ×2 IMPLANT
COOLER ICEMAN CLASSIC (MISCELLANEOUS) IMPLANT
COVER BACK TABLE 60X90IN (DRAPES) ×2 IMPLANT
COVER SURGICAL LIGHT HANDLE (MISCELLANEOUS) ×2 IMPLANT
CUP HUM SYS INSERT SZ 1/2 36 (Joint) IMPLANT
DRAPE C-ARM 42X120 X-RAY (DRAPES) IMPLANT
DRAPE INCISE IOBAN 66X45 STRL (DRAPES) ×2 IMPLANT
DRAPE ORTHO SPLIT 77X108 STRL (DRAPES) ×2
DRAPE SHEET LG 3/4 BI-LAMINATE (DRAPES) ×4 IMPLANT
DRAPE SURG ORHT 6 SPLT 77X108 (DRAPES) ×4 IMPLANT
DRSG AQUACEL AG ADV 3.5X 6 (GAUZE/BANDAGES/DRESSINGS) ×2 IMPLANT
ELECT BLADE TIP CTD 4 INCH (ELECTRODE) ×2 IMPLANT
ELECT PENCIL ROCKER SW 15FT (MISCELLANEOUS) IMPLANT
ELECT REM PT RETURN 15FT ADLT (MISCELLANEOUS) ×2 IMPLANT
FACESHIELD WRAPAROUND (MASK) ×2 IMPLANT
FACESHIELD WRAPAROUND OR TEAM (MASK) ×4 IMPLANT
GLENOSPHERE REV SHOULDER 36 (Joint) IMPLANT
GLOVE BIO SURGEON STRL SZ 6.5 (GLOVE) ×4 IMPLANT
GLOVE BIOGEL PI IND STRL 6.5 (GLOVE) ×2 IMPLANT
GLOVE BIOGEL PI IND STRL 8 (GLOVE) ×2 IMPLANT
GLOVE ECLIPSE 8.0 STRL XLNG CF (GLOVE) ×4 IMPLANT
GOWN STRL REUS W/ TWL LRG LVL3 (GOWN DISPOSABLE) ×4 IMPLANT
GOWN STRL REUS W/TWL LRG LVL3 (GOWN DISPOSABLE) ×2
GUIDE PIN 3X75 SHOULDER (PIN) ×1
GUIDEWIRE GLENOID 2.5X220 (WIRE) IMPLANT
HANDPIECE INTERPULSE COAX TIP (DISPOSABLE) ×1
KIT BASIN OR (CUSTOM PROCEDURE TRAY) ×2 IMPLANT
KIT STABILIZATION SHOULDER (MISCELLANEOUS) ×2 IMPLANT
KIT TURNOVER KIT A (KITS) IMPLANT
MANIFOLD NEPTUNE II (INSTRUMENTS) ×2 IMPLANT
NDL MAYO CATGUT SZ4 TPR NDL (NEEDLE) IMPLANT
NEEDLE MAYO CATGUT SZ4 (NEEDLE) IMPLANT
NS IRRIG 1000ML POUR BTL (IV SOLUTION) ×2 IMPLANT
PACK SHOULDER (CUSTOM PROCEDURE TRAY) ×2 IMPLANT
PAD COLD SHLDR WRAP-ON (PAD) IMPLANT
PIN GUIDE 3X75 SHOULDER (PIN) IMPLANT
RESTRAINT HEAD UNIVERSAL NS (MISCELLANEOUS) ×2 IMPLANT
SCREW 5.5X22 (Screw) IMPLANT
SCREW 5.5X26 (Screw) IMPLANT
SCREW BONE THREAD 6.5X35 (Screw) IMPLANT
SET HNDPC FAN SPRY TIP SCT (DISPOSABLE) ×2 IMPLANT
SLING ARM FOAM STRAP LRG (SOFTGOODS) IMPLANT
SLING ARM IMMOBILIZER LRG (SOFTGOODS) IMPLANT
SPONGE T-LAP 4X18 ~~LOC~~+RFID (SPONGE) ×2 IMPLANT
STEM HUMERAL PLUS SHORT SZ2+ (Orthopedic Implant) IMPLANT
SUCTION FRAZIER HANDLE 12FR (TUBING)
SUCTION TUBE FRAZIER 12FR DISP (TUBING) IMPLANT
SUT ETHIBOND 2 V 37 (SUTURE) ×2 IMPLANT
SUT ETHIBOND NAB CT1 #1 30IN (SUTURE) ×2 IMPLANT
SUT FIBERWIRE #5 38 CONV NDL (SUTURE)
SUT MNCRL AB 4-0 PS2 18 (SUTURE) ×2 IMPLANT
SUT VIC AB 0 CT1 36 (SUTURE) IMPLANT
SUT VIC AB 3-0 SH 27 (SUTURE) ×1
SUT VIC AB 3-0 SH 27X BRD (SUTURE) ×2 IMPLANT
SUTURE FIBERWR #5 38 CONV NDL (SUTURE) IMPLANT
TOWEL OR 17X26 10 PK STRL BLUE (TOWEL DISPOSABLE) ×2 IMPLANT
TUBE SUCTION HIGH CAP CLEAR NV (SUCTIONS) ×2 IMPLANT
WATER STERILE IRR 1000ML POUR (IV SOLUTION) ×4 IMPLANT

## 2022-04-14 NOTE — Anesthesia Procedure Notes (Signed)
Procedure Name: Intubation Date/Time: 04/14/2022 7:24 AM  Performed by: Victoriano Lain, CRNAPre-anesthesia Checklist: Patient identified, Emergency Drugs available, Suction available, Patient being monitored and Timeout performed Patient Re-evaluated:Patient Re-evaluated prior to induction Oxygen Delivery Method: Circle system utilized Preoxygenation: Pre-oxygenation with 100% oxygen Induction Type: IV induction Ventilation: Mask ventilation without difficulty Laryngoscope Size: Mac and 4 Grade View: Grade I Tube type: Oral Tube size: 7.5 mm Number of attempts: 1 Airway Equipment and Method: Stylet Placement Confirmation: ETT inserted through vocal cords under direct vision, positive ETCO2 and breath sounds checked- equal and bilateral Secured at: 22 cm Tube secured with: Tape Dental Injury: Teeth and Oropharynx as per pre-operative assessment

## 2022-04-14 NOTE — Anesthesia Procedure Notes (Signed)
Anesthesia Regional Block: Interscalene brachial plexus block   Pre-Anesthetic Checklist: , timeout performed,  Correct Patient, Correct Site, Correct Laterality,  Correct Procedure, Correct Position, site marked,  Risks and benefits discussed,  Surgical consent,  Pre-op evaluation,  At surgeon's request and post-op pain management  Laterality: Upper and Right  Prep: chloraprep       Needles:  Injection technique: Single-shot  Needle Type: Stimulator Needle - 40     Needle Length: 4cm  Needle Gauge: 22     Additional Needles:   Procedures:,,,, ultrasound used (permanent image in chart),,    Narrative:  Start time: 04/14/2022 7:04 AM End time: 04/14/2022 7:24 AM Injection made incrementally with aspirations every 5 mL.  Performed by: Personally  Anesthesiologist: Nolon Nations, MD  Additional Notes: BP cuff, SpO2 and EKG monitors applied. Sedation begun. Nerve location verified with ultrasound. Anesthetic injected incrementally, slowly, and after neg aspirations under direct u/s guidance. Good perineural spread. Tolerated well.

## 2022-04-14 NOTE — Anesthesia Postprocedure Evaluation (Signed)
Anesthesia Post Note  Patient: [name] [name]  Procedure(s) Performed: REVERSE SHOULDER ARTHROPLASTY (Right: Shoulder)     Patient location during evaluation: PACU Anesthesia Type: General Level of consciousness: sedated and patient cooperative Pain management: pain level controlled Vital Signs Assessment: post-procedure vital signs reviewed and stable Respiratory status: spontaneous breathing Cardiovascular status: stable Anesthetic complications: no  No notable events documented.  Last Vitals:  Vitals:   04/14/22 1419 04/14/22 1728  BP: 111/71 126/79  Pulse: 84 90  Resp: 14 16  Temp: 36.7 C 36.9 C  SpO2: 96% 93%    Last Pain:  Vitals:   04/14/22 1728  TempSrc: Oral  PainSc:                  Nolon Nations

## 2022-04-14 NOTE — Op Note (Signed)
Orthopaedic Surgery Operative Note (CSN: [id number])  [name] [name]  10-06-1965 Date of Surgery: 04/14/2022   Diagnoses:  Right irreparable cuff tear  Procedure: Right reverse lateralized augmented total Shoulder Arthroplasty   Operative Finding Successful completion of planned procedure.  Patient had posterior superior wear and a relatively small glenoid.  We used a full wedge augment into retentive polyethylene as the patient wife use arm early for mobilization.  Sling will be for comfort only.  Post-operative plan: The patient will be NWB in sling for comfort and can be discontinued for mobility purposes once motor nerve block has elapsed.  The patient will be will be admitted to observation due to medical complexity, monitoring and pain management.  DVT prophylaxis Aspirin 81 mg twice daily for 6 weeks.  Pain control with PRN pain medication preferring oral medicines.  Follow up plan will be scheduled in approximately 7 days for incision check and XR.  Physical therapy to start immediately.  Implants: Tornier perform humeral size 2+ stem, 0 retentive polyethylene, 36 standard glenosphere, 25 full wedge baseplate, 35 center screw and 4 peripheral screws  Post-Op Diagnosis: Same Surgeons:Primary: Hiram Gash, MD Assistants:Caroline McBane PA-C Location: [facility] ROOM 06 Anesthesia: General with Exparel Interscalene Antibiotics: Ancef 2g preop, Vancomycin '1000mg'$  locally Tourniquet time: None Estimated Blood Loss: 814 Complications: None Specimens: None Implants: Implant Name Type Inv. Item Serial No. Manufacturer Lot No. LRB No. Used Action  GLENOSPHERE REV SHOULDER 36 - [id number] Joint GLENOSPHERE REV SHOULDER 36  [employer] [id number] Right 1 Implanted  AUG BASEPLATE 15DEG 25 WEDGE - [id number] Joint AUG BASEPLATE 15DEG 25 WEDGE  [employer] [id number] Right 1 Implanted  SCREW BONE THREAD 6.5X35 - [id number] Screw SCREW BONE THREAD 6.5X35  [employer]  Right 1 Implanted  SCREW  5.5X22 - [id number] Screw SCREW 5.5X22  [employer]  Right 3 Implanted  SCREW 5.5X26 - [id number] Screw SCREW 5.5X26  [employer]  Right 1 Implanted  STEM HUMERAL PLUS SHORT SZ2+ - [id number] Orthopedic Implant STEM HUMERAL PLUS SHORT SZ2+  [employer] [id number] Right 1 Implanted  CUP HUM SYS INSERT SZ 1/2 36 - [id number] Joint CUP HUM SYS INSERT SZ 1/2 36  [employer] [id number] Right 1 Implanted    Indications for Surgery:   [name] [name] is a 56 y.o. female with irreparable cuff tear and mobility issues.  Benefits and risks of operative and nonoperative management were discussed prior to surgery with patient/guardian(s) and informed consent form was completed.  Infection and need for further surgery were discussed as was prosthetic stability and cuff issues.  We additionally specifically discussed risks of axillary nerve injury, infection, periprosthetic fracture, continued pain and longevity of implants prior to beginning procedure.      Procedure:   The patient was identified in the preoperative holding area where the surgical site was marked. Block placed by anesthesia with exparel.  The patient was taken to the OR where a procedural timeout was called and the above noted anesthesia was induced.  The patient was positioned beachchair on allen table with spider arm positioner.  Preoperative antibiotics were dosed.  The patient's right shoulder was prepped and draped in the usual sterile fashion.  A second preoperative timeout was called.       Standard deltopectoral approach was performed with a #10 blade. We dissected down to the subcutaneous tissues and the cephalic vein was taken laterally with the deltoid. Clavipectoral fascia was incised in line with the incision. Deep retractors were placed. The  long of the biceps tendon was identified and there was significant tenosynovitis present.  Tenodesis was performed to the pectoralis tendon with #2 Ethibond. The remaining biceps was followed up  into the rotator interval where it was released.   The subscapularis was taken down in a full thickness layer with capsule along the humeral neck extending inferiorly around the humeral head. We continued releasing the capsule directly off of the osteophytes inferiorly all the way around the corner. This allowed [place] to dislocate the humeral head.   The rotator cuff was carefully examined and noted to be irreperably torn.  The decision was confirmed that a reverse total shoulder was indicated for this patient.  There were osteophytes along the inferior humeral neck. The osteophytes were removed with an osteotome and a rongeur.  Osteophytes were removed with a rongeur and an osteotome and the anatomic neck was well visualized.     A humeral cutting guide was used extra medullary with a pin to help control version. The version was set at 20 of retroversion. Humeral osteotomy was performed with an oscillating saw. The head fragment was passed off the back table.  A cut protector plate was placed.  The subscapularis was again identified and immediately we took care to palpate the axillary nerve anteriorly and verify its position with gentle palpation as well as the tug test.  We then released the SGHL with bovie cautery prior to placing a curved mayo at the junction of the anterior glenoid well above the axillary nerve and bluntly dissecting the subscapularis from the capsule.  We then carefully protected the axillary nerve as we gently released the inferior capsule to fully mobilize the subscapularis.  An anterior deltoid retractor was then placed as well as a small Hohmann retractor superiorly.  The glenoid was relatively intact in the setting of an irreparable cuff tear  The remaining labrum was removed circumferentially taking great care not to disrupt the posterior capsule.   At this point we felt based on blueprint templating that a full wedge augment was necessary.  We began by using a full wedge  guide to place our center pin as was templated.  We had good position of this pin and we proceeded with our starter center drill.  This allowed for [place] to use the 15 degree full wedge reamer obtaining circumferential witness marks and good bone preparation for ingrowth.  At this point we proceeded with our center drill and had an intact vault.  We then drilled our center screw to a length of 35 mm.    We selected a 6.5 mm x 35 mm screw and the full wedge baseplate which was placed in the same orientation as our reaming.  We double checked that we had good apposition of the base plate to bone and then proceeded to place 3 locking screws and one nonlocking screw as is typical.   Next a 36 mm glenosphere was selected and impacted onto the baseplate. The center screw was tightened.  We turned attention back to the humeral side. The cut protector was removed.  We used the perform humeral sizing block to select the appropriate size which for this patient was a 2.  We then placed our center pin and reamed over it concentrically obtaining appropriate inset.  We then used our lateralizing chisel to prepare the lateral aspect of the humerus.  At that point we selected the appropriate implant trialing a 2+.  Using this trial implant we trialed multiple polyethylene sizes  settling on a 0 retentive which provided good stability and range of motion without excess soft tissue tension. The offset was dialed in to match the normal anatomy. The shoulder was trialed.  There was good ROM in all planes and the shoulder was stable with no inferior translation.  The real humeral implants were opened after again confirming sizes.  The trial was removed. #5 Fiberwire x4 sutures passed through the humeral neck for subscap repair. The humeral component was press-fit obtaining a secure fit. The joint was reduced and thoroughly irrigated with pulsatile lavage. Subscap was repaired back with #5 Fiberwire sutures through bone tunnels.  Hemostasis was obtained. The deltopectoral interval was reapproximated with #1 Ethibond. The subcutaneous tissues were closed with 2-0 Vicryl and the skin was closed with running monocryl.    The wounds were cleaned and dried and an Aquacel dressing was placed. The drapes taken down. The arm was placed into sling with abduction pillow. Patient was awakened, extubated, and transferred to the recovery room in stable condition. There were no intraoperative complications. The sponge, needle, and attention counts were  correct at the end of the case.     Noemi Chapel, PA-C, present and scrubbed throughout the case, critical for completion in a timely fashion, and for retraction, instrumentation, closure.

## 2022-04-14 NOTE — Transfer of Care (Signed)
Immediate Anesthesia Transfer of Care Note  Patient: [name] [name]  Procedure(s) Performed: REVERSE SHOULDER ARTHROPLASTY (Right: Shoulder)  Patient Location: PACU  Anesthesia Type:General  Level of Consciousness: awake, alert , oriented, and patient cooperative  Airway & Oxygen Therapy: Patient Spontanous Breathing and Patient connected to face mask oxygen  Post-op Assessment: Report given to RN, Post -op Vital signs reviewed and stable, and Patient moving all extremities  Post vital signs: Reviewed and stable  Last Vitals:  Vitals Value Taken Time  BP 101/62 04/14/22 0909  Temp 36.6 C 04/14/22 0909  Pulse 75 04/14/22 0911  Resp 11 04/14/22 0911  SpO2 100 % 04/14/22 0911  Vitals shown include unvalidated device data.  Last Pain:  Vitals:   04/14/22 0607  TempSrc: Oral         Complications: No notable events documented.

## 2022-04-14 NOTE — Interval H&P Note (Signed)
All questions answered

## 2022-04-14 NOTE — Discharge Instructions (Addendum)
Ophelia Charter MD, MPH Noemi Chapel, PA-C [place] [address] [phone] (tel)   [phone] (fax)   Felt may leave the operative dressing in place until your follow-up appointment. KEEP THE INCISIONS CLEAN AND DRY. There may be a small amount of fluid/bleeding leaking at the surgical site. This is normal after surgery.  If it fills with liquid or blood please call us immediately to change it for you. Use the provided ice machine or Ice packs as often as possible for the first 3-4 days, then as needed for pain relief.   Keep a layer of cloth or a shirt between your skin and the cooling unit to prevent frost bite as it can get very cold.  SHOWERING: - You may shower on Post-Op Day #2.  - The dressing is water resistant but do not scrub it as it may start to peel up.   - You may remove the sling for showering - Gently pat the area dry.  - Do not soak the shoulder in water.  - Do not go swimming in the pool or ocean until your incision has completely healed (about 4-6 weeks after surgery) - KEEP THE INCISIONS CLEAN AND DRY.  EXERCISES Wear the sling for comfort once your nerve block wears off You may remove the sling for showering, but keep the arm across the chest or in a secondary sling.     Accidental/Purposeful External Rotation and shoulder flexion (reaching behind you) is to be avoided at all costs for the first month. It is ok to come out of your sling if your are sitting and have assistance for eating.   It is okay to use your right arm for weightbearing, transfers, and ADLs at waist level  It is normal for your fingers/hand to become more swollen after surgery and discolored from bruising.   This will resolve over the first few weeks usually after surgery. Please continue to ambulate and do not stay sitting or lying for too long.  Perform foot and wrist pumps to  assist in circulation.  PHYSICAL THERAPY - You will begin physical therapy soon after surgery (unless otherwise specified) - Please call to set up an appointment, if you do not already have one  - Let our office if there are any issues with scheduling your therapy  - You have a physical therapy appointment scheduled at [facility] (across the hall from our office) on Friday 12/29 @ [place] ([place]) The anesthesia team may have performed a nerve block for you this is a great tool used to minimize pain.   The block may start wearing off overnight (between 8-24 hours postop) When the block wears off, your pain may go from nearly zero to the pain you would have had postop without the block. This is an abrupt transition but nothing dangerous is happening.   This can be a challenging period but utilize your as needed pain medications to try and manage this period. We suggest you use the pain medication the first night prior to going to bed, to ease this transition.  You may take an extra dose of narcotic when this happens if needed   POST-OP MEDICATIONS- Multimodal approach to pain control In general your pain will be controlled with a combination of substances.  Prescriptions unless otherwise discussed are electronically sent to your pharmacy.  This is a carefully made plan  we use to minimize narcotic use.     Celebrex - Anti-inflammatory medication taken on a scheduled basis Acetaminophen - Non-narcotic pain medicine taken on a scheduled basis  Robaxin - this is a muscle relaxer, take as needed for muscle spasms Aspirin '81mg'$  - This medicine is used to minimize the risk of blood clots after surgery. Zofran -  take as needed for nausea  - You will continue your normal pain prescription as prescribed by your pain management doctor. No additional narcotics to be sent.   FOLLOW-UP If you develop a Fever (>101.5), Redness or Drainage from the surgical incision site, please  call our office to arrange for an evaluation. Please call the office to schedule a follow-up appointment for a wound check, 7-10 days post-operatively.  IF YOU HAVE ANY QUESTIONS, PLEASE FEEL FREE TO CALL OUR OFFICE.  HELPFUL INFORMATION  Your arm will be in a sling following surgery. You will be in this sling for the next 4 weeks. You may use your sling for comfort   You may be more comfortable sleeping in a semi-seated position the first few nights following surgery.  Keep a pillow propped under the elbow and forearm for comfort.  If you have a recliner type of chair it might be beneficial.  If not that is fine too, but it would be helpful to sleep propped up with pillows behind your operated shoulder as well under your elbow and forearm.  This will reduce pulling on the suture lines.  When dressing, put your operative arm in the sleeve first.  When getting undressed, take your operative arm out last.  Loose fitting, button-down shirts are recommended.  In most states it is against the law to drive while your arm is in a sling. And certainly against the law to drive while taking narcotics.  You may return to work/school in the next couple of days when you feel up to it. Desk work and typing in the sling is fine.  We suggest you use the pain medication the first night prior to going to bed, in order to ease any pain when the anesthesia wears off. You should avoid taking pain medications on an empty stomach as it will make you nauseous.  You should wean off your narcotic medicines as soon as you are able.     Most patients will be off or using minimal narcotics before their first postop appointment.   Do not drink alcoholic beverages or take illicit drugs when taking pain medications.  Pain medication may make you constipated.  Below are a few solutions to try in this order: Decrease the amount of pain medication if you aren't having pain. Drink lots of decaffeinated fluids. Drink prune  juice and/or each dried prunes  If the first 3 don't work start with additional solutions Take Colace - an over-the-counter stool softener Take Senokot - an over-the-counter laxative Take Miralax - a stronger over-the-counter laxative   Dental Antibiotics:  In most cases prophylactic antibiotics for Dental procdeures after total joint surgery are not necessary.  Exceptions are as follows:  1. History of prior total joint infection  2. Severely immunocompromised (Organ Transplant, cancer chemotherapy, Rheumatoid biologic meds such as Goldenrod)  3. Poorly controlled diabetes (A1C &gt; 8.0, blood glucose over 200)  If you have one of these conditions, contact your surgeon for an antibiotic prescription, prior to your dental procedure.   For more information including helpful videos and documents visit our website:   [email or web address]

## 2022-04-15 DIAGNOSIS — K219 Gastro-esophageal reflux disease without esophagitis: Secondary | ICD-10-CM | POA: Diagnosis not present

## 2022-04-15 DIAGNOSIS — R569 Unspecified convulsions: Secondary | ICD-10-CM | POA: Diagnosis not present

## 2022-04-15 DIAGNOSIS — I10 Essential (primary) hypertension: Secondary | ICD-10-CM | POA: Diagnosis not present

## 2022-04-15 DIAGNOSIS — R519 Headache, unspecified: Secondary | ICD-10-CM | POA: Diagnosis not present

## 2022-04-15 DIAGNOSIS — G8929 Other chronic pain: Secondary | ICD-10-CM | POA: Diagnosis not present

## 2022-04-15 DIAGNOSIS — S46011A Strain of muscle(s) and tendon(s) of the rotator cuff of right shoulder, initial encounter: Secondary | ICD-10-CM | POA: Diagnosis not present

## 2022-04-15 DIAGNOSIS — J45909 Unspecified asthma, uncomplicated: Secondary | ICD-10-CM | POA: Diagnosis not present

## 2022-04-15 DIAGNOSIS — M1990 Unspecified osteoarthritis, unspecified site: Secondary | ICD-10-CM | POA: Diagnosis not present

## 2022-04-15 DIAGNOSIS — G4730 Sleep apnea, unspecified: Secondary | ICD-10-CM | POA: Diagnosis not present

## 2022-04-15 DIAGNOSIS — Z79899 Other long term (current) drug therapy: Secondary | ICD-10-CM | POA: Diagnosis not present

## 2022-04-15 DIAGNOSIS — I25119 Atherosclerotic heart disease of native coronary artery with unspecified angina pectoris: Secondary | ICD-10-CM | POA: Diagnosis not present

## 2022-04-15 MED ORDER — METHOCARBAMOL 750 MG PO TABS: 750.0000 mg | ORAL_TABLET | Freq: Three times a day (TID) | ORAL | 0 refills | Status: DC | PRN

## 2022-04-15 MED ORDER — ASPIRIN 81 MG PO CHEW: 81.0000 mg | CHEWABLE_TABLET | Freq: Two times a day (BID) | ORAL | 0 refills | Status: DC

## 2022-04-15 MED ORDER — ONDANSETRON HCL 4 MG PO TABS: 4.0000 mg | ORAL_TABLET | Freq: Three times a day (TID) | ORAL | 0 refills | Status: AC | PRN

## 2022-04-15 MED ORDER — ACETAMINOPHEN 500 MG PO TABS: 500.0000 mg | ORAL_TABLET | Freq: Three times a day (TID) | ORAL | 2 refills | Status: DC | PRN

## 2022-04-15 MED ORDER — CELECOXIB 100 MG PO CAPS: 100.0000 mg | ORAL_CAPSULE | Freq: Two times a day (BID) | ORAL | 0 refills | Status: AC

## 2022-04-15 NOTE — Evaluation (Signed)
Occupational Therapy Evaluation Patient Details Name: KALIANN CORYELL MRN: [id number] DOB: 30-Sep-1965 Today's Date: 04/15/2022   History of Present Illness Ms. Sahlin is s/p a reverse lateralized augmented total shoulder arthroplasty on 04-14-2022, due to a R cuff tear.   Clinical Impression   Ms. Schuur is s/p shoulder replacement without functional use of right dominant upper extremity, secondary to effects of surgery, interscalene block, and shoulder precautions. Therapist provided education and instruction to patient with in regards to ROM/exercises, post-op precautions, UE positioning, correctly donning upper extremity clothing, recommendations for bathing while maintaining shoulder precautions, use of ice for pain and edema management, correct use of ice machine, and correctly donning/doffing sling. Patient verbalized and demonstrated understanding as needed. Patient needed assistance to donn shirt, underwear, pants, socks and shoes with instruction provided on compensatory strategies to perform ADLs. Patient to follow up with MD for further therapy needs.        Recommendations for follow up therapy are one component of a multi-disciplinary discharge planning process, led by the attending physician.  Recommendations may be updated based on patient status, additional functional criteria and insurance authorization.   Follow Up Recommendations  Follow physician's recommendations for discharge plan and follow up therapies     Assistance Recommended at Discharge Intermittent Supervision/Assistance  Patient can return home with the following Help with stairs or ramp for entrance;A little help with bathing/dressing/bathroom;Assist for transportation;Assistance with cooking/housework    Functional Status Assessment  Patient has had a recent decline in their functional status and demonstrates the ability to make significant improvements in function in a reasonable and predictable amount of  time.  Equipment Recommendations  None recommended by OT       Precautions / Restrictions Precautions Precautions: Shoulder Type of Shoulder Precautions: Okay to use right arm for weight bearing and transfers, Okay to use right arm for ADLs at waist level, Okay to perform elbow, wrist, and hand AROM, no AROM or PROM of shoulder Shoulder Interventions: Shoulder sling/immobilizer Precaution Booklet Issued: Yes (comment) Required Braces or Orthoses: Sling Restrictions Weight Bearing Restrictions: Yes RUE Weight Bearing: Non weight bearing      Mobility Bed Mobility Overal bed mobility: Needs Assistance             General bed mobility comments: Pt was received seated in the bedside chair    Transfers Overall transfer level: Needs assistance   Transfers: Sit to/from Stand Sit to Stand: Supervision        Balance     Sitting balance-Leahy Scale: Good         Standing balance comment: Fair+ with use of cane             ADL either performed or assessed with clinical judgement      Pertinent Vitals/Pain Pain Assessment Pain Assessment: No/denies pain     Hand Dominance Right      Communication Communication Communication: No difficulties   Cognition Arousal/Alertness: Awake/alert Behavior During Therapy: WFL for tasks assessed/performed Overall Cognitive Status: Within Functional Limits for tasks assessed          General Comments: Oriented x4, able to follow commands           Shoulder Instructions Shoulder Instructions Donning/doffing shirt without moving shoulder: Moderate assistance Method for sponge bathing under operated UE:  (Patient verbalized understanding) Donning/doffing sling/immobilizer: Moderate assistance Correct positioning of sling/immobilizer:  (Patient verbalized understanding) ROM for elbow, wrist and digits of operated UE:  (Patient verbalized understanding) Sling wearing schedule (on at  all times/off for ADL's):   (Patient verbalized understanding) Proper positioning of operated UE when showering:  (Patient verbalized understanding) Positioning of UE while sleeping:  (Patient verbalized understanding)    [place] expects to be discharged to:: Private residence Living Arrangements: Other (Comment) (She will be staying with her son for while once discharged from the hospital. Household layout is based on her son's home) Available Help at Discharge: Family Type of Home: Mobile home Home Access: Stairs to enter Entrance Stairs-Number of Steps: 3 Entrance Stairs-Rails: Left Home Layout: One [place]: Conservation officer, nature (2 wheels);Cane - single point;BSC/3in1          Prior Functioning/Environment Prior Level of Function : Independent/Modified Independent             Mobility Comments:  (Uses hurrycane mostly with occasional use of RW) ADLs Comments: Pt reports she is independent/modified indepe for BADL management. (Pt reports she is independent/modified independent for ADLs, cooking, and cleaning. She drives.)        OT Problem List: Impaired UE functional use            OT Frequency:  N/A       AM-PAC OT "6 Clicks" Daily Activity     Outcome Measure Help from another person eating meals?: None Help from another person taking care of personal grooming?: None Help from another person toileting, which includes using toliet, bedpan, or urinal?: None Help from another person bathing (including washing, rinsing, drying)?: A Little Help from another person to put on and taking off regular upper body clothing?: A Lot Help from another person to put on and taking off regular lower body clothing?: A Little 6 Click Score: 20   End of Session Nurse Communication: Other (comment) (patient cleared for d/c from OT perspective)  Activity Tolerance: Patient tolerated treatment well Patient left: in chair;with call bell/phone within reach  OT  Visit Diagnosis: Muscle weakness (generalized) (M62.81)                Time: 4431-5400 OT Time Calculation (min): 37 min Charges:  OT General Charges $OT Visit: 1 Visit OT Evaluation $OT Eval Low Complexity: 1 Low OT Treatments $Self Care/Home Management : 8-22 mins    Leota Sauers, OTR/L 04/15/2022, 12:42 PM

## 2022-04-15 NOTE — Discharge Summary (Signed)
Patient ID: SHARHONDA ATWOOD MRN: [id number] DOB/AGE: 08-31-1965 56 y.o.  Admit date: 04/14/2022 Discharge date: 04/15/2022  Admission Diagnoses: right irreparable cuff tear  Discharge Diagnoses:  Principal Problem:   Status post reverse total arthroplasty of right shoulder   Past Medical History:  Diagnosis Date   Anxiety    Arthritis    Phreesia 02/08/2020   Asthma    mild intermittent   Asthma    Phreesia 02/08/2020   Bipolar 1 disorder ([place])    ect treatments last treatment Sep 02 1011   Depression    Depression    Phreesia 02/08/2020   Depression    Phreesia 07/10/2020   GERD (gastroesophageal reflux disease)    Headache    Hypertension    Pre-diabetes    Seizures ([place])    Thinks it was from taking Tramadol   Sleep apnea    wears CPAP, uncertain of setting   Substance abuse ([place])    Phreesia 02/08/2020     Procedures Performed: Right reverse total shoulder arthroplasty   Discharged Condition: stable  Hospital Course: Patient brought in as an outpatient for surgery.  Tolerated procedure well.  Was kept for monitoring overnight for pain control and medical monitoring postop. She was found to be stable for DC home the morning after surgery.  Patient was instructed on specific activity restrictions and all questions were answered.  Consults: OT  Significant Diagnostic Studies: No additional pertinent studies  Treatments: Surgery  Discharge Exam: Sensation intact distally Incision: dressing C/D/I Wiggles fingers RUE  Disposition: Discharge disposition: 01-Home or Self Care       Discharge Instructions     Call MD for:  redness, tenderness, or signs of infection (pain, swelling, redness, odor or green/yellow discharge around incision site)   Complete by: As directed    Call MD for:  severe uncontrolled pain   Complete by: As directed    Call MD for:  temperature >100.4   Complete by: As directed    Diet - low sodium heart healthy   Complete  by: As directed       Allergies as of 04/15/2022       Reactions   Other    Penicillins Hives, Rash   No anaphylactic or severe cutaneous Sx > 10 yr ago, no additional medical attention required Tolerates Keflex and Rocephin   Tetracyclines & Related Other (See Comments)   Syncope and put her "in a coma"   Tramadol Other (See Comments)   Seizures   Phenazopyridine Other (See Comments)   Unknown   Tetracycline Other (See Comments)   Syncope and "Put me in a coma"   Ciprofloxacin Rash, Itching   Codeine Itching, Rash   Estradiol Rash   Patches broke out the skin        Medication List     STOP taking these medications    cyclobenzaprine 10 MG tablet Commonly known as: FLEXERIL   meloxicam 15 MG tablet Commonly known as: MOBIC       TAKE these medications    acetaminophen 500 MG tablet Commonly known as: TYLENOL Take 1 tablet (500 mg total) by mouth every 8 (eight) hours as needed for moderate pain.   Aimovig 70 MG/ML So[facility] Generic drug: Erenumab-aooe administer 48m UNDER THE SKIN every 30 DAYS   albuterol 108 (90 Base) MCG/ACT inhaler Commonly known as: ProAir HFA Inhale 2 puffs = 1839m into the lungs every 6 (six) hours as needed for wheezing or shortness of breath.  aspirin 81 MG chewable tablet Commonly known as: Aspirin Childrens Chew 1 tablet (81 mg total) by mouth 2 (two) times daily. For 6 weeks for DVT prophylaxis after surgery   busPIRone 15 MG tablet Commonly known as: BUSPAR Take 15 mg by mouth 3 (three) times daily.   CALCIUM 1200 PO Take 1,200 mg by mouth daily.   celecoxib 100 MG capsule Commonly known as: CeleBREX Take 1 capsule (100 mg total) by mouth 2 (two) times daily. For 2 weeks. Then take as needed   clindamycin 300 MG capsule Commonly known as: Cleocin Take 1 capsule (300 mg total) by mouth 3 (three) times daily.   DULoxetine 60 MG capsule Commonly known as: CYMBALTA TAKE 1 CAPSULE BY MOUTH EVERY DAY   escitalopram  10 MG tablet Commonly known as: LEXAPRO Take 10 mg by mouth daily.   estradiol 1 MG tablet Commonly known as: ESTRACE Take 1 tablet (1 mg total) by mouth at bedtime.   famotidine 40 MG tablet Commonly known as: PEPCID Take 1 tablet (40 mg total) by mouth at bedtime.   fluticasone 50 MCG/ACT nasal spray Commonly known as: FLONASE SPRAY 2 SPRAYS INTRO EACH NOSTRIL EVERY DAY What changed: See the new instructions.   haloperidol 5 MG tablet Commonly known as: HALDOL Take 1 tablet (5 mg total) by mouth at bedtime. For psychosis   lamoTRIgine 100 MG tablet Commonly known as: LAMICTAL Take 100 mg by mouth daily.   levocetirizine 5 MG tablet Commonly known as: XYZAL Take 1 tablet (5 mg total) by mouth every evening.   lidocaine 5 % Commonly known as: LIDODERM Place 1 patch onto the skin daily as needed (pain). Remove & Discard patch within 12 hours or as directed by MD   Linzess 290 MCG Caps capsule Generic drug: linaclotide TAKE ONE CAPSULE BY MOUTH EVERY MORNING   methocarbamol 750 MG tablet Commonly known as: Robaxin-750 Take 1 tablet (750 mg total) by mouth every 8 (eight) hours as needed for muscle spasms.   metoprolol succinate 25 MG 24 hr tablet Commonly known as: TOPROL-XL Take 1 tablet (25 mg total) by mouth daily.   Movantik 25 MG Tabs tablet Generic drug: naloxegol oxalate Take 25 mg by mouth daily.   multivitamin with minerals tablet Take 1 tablet by mouth daily.   naloxone 4 MG/0.1ML Liqd nasal spray kit Commonly known as: NARCAN Place 0.4 mg into the nose once.   ondansetron 4 MG tablet Commonly known as: Zofran Take 1 tablet (4 mg total) by mouth every 8 (eight) hours as needed for up to 7 days for nausea or vomiting.   oxyCODONE-acetaminophen 7.5-325 MG tablet Commonly known as: PERCOCET Take 1 tablet by mouth 5 (five) times daily.   pantoprazole 40 MG tablet Commonly known as: PROTONIX Take 1 tablet (40 mg total) by mouth 2 (two) times  daily.   prazosin 2 MG capsule Commonly known as: MINIPRESS Take 2 mg by mouth at bedtime.   pregabalin 100 MG capsule Commonly known as: LYRICA Take 1 capsule (100 mg total) by mouth 2 (two) times daily. What changed: when to take this   promethazine 25 MG tablet Commonly known as: PHENERGAN Take 1 tablet (25 mg total) by mouth every 8 (eight) hours as needed for nausea or vomiting.   solifenacin 10 MG tablet Commonly known as: VESICARE Take 1 tablet (10 mg total) by mouth daily.   spironolactone 50 MG tablet Commonly known as: ALDACTONE TAKE ONE TABLET BY MOUTH DAILY   sulfamethoxazole-trimethoprim   800-160 MG tablet Commonly known as: BACTRIM DS Take 1 tablet by mouth 2 (two) times daily.   sulfamethoxazole-trimethoprim 800-160 MG tablet Commonly known as: BACTRIM DS Take 1 tablet by mouth 2 (two) times daily.   SUMAtriptan 50 MG tablet Commonly known as: IMITREX TAKE 1 TAB EVERY 2 HOURS AS NEEDED FOR MIGRAINE. MAY REPEAT IN 2 HOURS IF PERSISTS OR RECURS   topiramate 50 MG tablet Commonly known as: TOPAMAX Take 1 tablet (50 mg total) by mouth 2 (two) times daily.   traZODone 100 MG tablet Commonly known as: DESYREL Take 200 mg by mouth at bedtime as needed for sleep.        Follow-up Information     Varkey, Dax T, MD. Go on 04/22/2022.   Specialty: Orthopedic Surgery Why: your appointment is scheduled for 4:30 Contact information: [address] [place] [address] [phone]         [facility] Go on 04/16/2022.   Why: Your outpatient physical therapy appointment is scheduled for 11:00 Contact information: [facility] [address] [place]  [address] [phone]                  , PA-C 04/15/2022   

## 2022-04-15 NOTE — TOC CM/SW Note (Signed)
Transition of Care [facility]) Screening Note  Patient Details  Name: [name] [name] Date of Birth: 08-07-1965  Transition of Care [facility]) CM/SW Contact:    Sherie Don, LCSW Phone Number: 04/15/2022, 10:01 AM  Transition of Care Department [facility]) has reviewed patient and no TOC needs have been identified at this time. We will continue to monitor patient advancement through interdisciplinary progression rounds. If new patient transition needs arise, please place a TOC consult.

## 2022-04-15 NOTE — Plan of Care (Signed)
Pt ready to DC home with mother.

## 2022-04-15 NOTE — Progress Notes (Signed)
Subjective: 1 Day Post-Op Procedure(s) (LRB): REVERSE SHOULDER ARTHROPLASTY (Right) Patient reports pain as mild and block still working .    Objective: Vital signs in last 24 hours: Temp:  [97.5 F (36.4 C)-98.4 F (36.9 C)] 98.2 F (36.8 C) (12/28 0543) Pulse Rate:  [74-90] 79 (12/28 0543) Resp:  [10-18] 18 (12/28 0543) BP: (93-126)/(59-79) 106/69 (12/28 0543) SpO2:  [92 %-100 %] 95 % (12/28 0543)  Intake/Output from previous day: 12/27 0701 - 12/28 0700 In: 3198.2 [P.O.:1020; I.V.:1578.2; IV Piggyback:600] Out: 1150 [Urine:1100; Blood:50] Intake/Output this shift: Total I/O In: 600 [P.O.:300; IV Piggyback:300] Out: 0   No results for input(s): "HGB" in the last 72 hours. No results for input(s): "WBC", "RBC", "HCT", "PLT" in the last 72 hours. No results for input(s): "NA", "K", "CL", "CO2", "BUN", "CREATININE", "GLUCOSE", "CALCIUM" in the last 72 hours. No results for input(s): "LABPT", "INR" in the last 72 hours.  Sensation intact distally Incision: dressing C/D/I Wiggles fingers RUE   Assessment/Plan: 1 Day Post-Op Procedure(s) (LRB): REVERSE SHOULDER ARTHROPLASTY (Right)  Stay in sling until block wears off, otherwise sling for comfort Ok to use arm for walker and ADLs at waist level Work with OT this AM prior to d/c home today with her son and daughter in law   Chriss Czar 04/15/2022, 6:59 AM

## 2022-04-18 ENCOUNTER — Other Ambulatory Visit: Payer: Self-pay | Admitting: Family Medicine

## 2022-04-22 ENCOUNTER — Telehealth: Payer: Self-pay | Admitting: Pharmacist

## 2022-04-22 DIAGNOSIS — M19011 Primary osteoarthritis, right shoulder: Secondary | ICD-10-CM | POA: Diagnosis not present

## 2022-04-22 NOTE — Progress Notes (Addendum)
Care Management & Coordination Services Pharmacy Team  Reason for Encounter: Medication coordination and delivery  Contacted patient on 04/22/2022 to discuss medications    Medications: Outpatient Encounter Medications as of 04/22/2022  Medication Sig   acetaminophen (TYLENOL) 500 MG tablet Take 1 tablet (500 mg total) by mouth every 8 (eight) hours as needed for moderate pain.   albuterol (PROAIR HFA) 108 (90 Base) MCG/ACT inhaler Inhale 2 puffs = 12mg into the lungs every 6 (six) hours as needed for wheezing or shortness of breath.   aspirin (ASPIRIN CHILDRENS) 81 MG chewable tablet Chew 1 tablet (81 mg total) by mouth 2 (two) times daily. For 6 weeks for DVT prophylaxis after surgery   busPIRone (BUSPAR) 15 MG tablet Take 15 mg by mouth 3 (three) times daily.    Calcium Carbonate-Vit D-Min (CALCIUM 1200 PO) Take 1,200 mg by mouth daily.    celecoxib (CELEBREX) 100 MG capsule Take 1 capsule (100 mg total) by mouth 2 (two) times daily. For 2 weeks. Then take as needed   clindamycin (CLEOCIN) 300 MG capsule Take 1 capsule (300 mg total) by mouth 3 (three) times daily. (Patient not taking: Reported on 04/05/2022)   DULoxetine (CYMBALTA) 60 MG capsule TAKE ONE CAPSULE BY MOUTH ONCE DAILY   Erenumab-aooe (AIMOVIG) 70 MG/ML SOAJ administer 183mUNDER THE SKIN every 30 DAYS   escitalopram (LEXAPRO) 10 MG tablet Take 10 mg by mouth daily.   estradiol (ESTRACE) 1 MG tablet Take 1 tablet (1 mg total) by mouth at bedtime.   famotidine (PEPCID) 40 MG tablet TAKE ONE TABLET BY MOUTH EVERYDAY AT BEDTIME   fluticasone (FLONASE) 50 MCG/ACT nasal spray SPRAY 2 SPRAYS INTRO EACH NOSTRIL EVERY DAY (Patient taking differently: Place 2 sprays into both nostrils daily as needed for allergies.)   haloperidol (HALDOL) 5 MG tablet Take 1 tablet (5 mg total) by mouth at bedtime. For psychosis   lamoTRIgine (LAMICTAL) 100 MG tablet Take 100 mg by mouth daily.   levocetirizine (XYZAL) 5 MG tablet TAKE ONE TABLET BY  MOUTH EVERY EVENING   lidocaine (LIDODERM) 5 % Place 1 patch onto the skin daily as needed (pain). Remove & Discard patch within 12 hours or as directed by MD   LINZESS 290 MCG CAPS capsule TAKE ONE CAPSULE BY MOUTH EVERY MORNING (Patient not taking: Reported on 04/06/2022)   methocarbamol (ROBAXIN-750) 750 MG tablet Take 1 tablet (750 mg total) by mouth every 8 (eight) hours as needed for muscle spasms.   metoprolol succinate (TOPROL-XL) 25 MG 24 hr tablet Take 1 tablet (25 mg total) by mouth daily.   MOVANTIK 25 MG TABS tablet Take 25 mg by mouth daily.   Multiple Vitamins-Minerals (MULTIVITAMIN WITH MINERALS) tablet Take 1 tablet by mouth daily.   naloxone (NARCAN) nasal spray 4 mg/0.1 mL Place 0.4 mg into the nose once.   ondansetron (ZOFRAN) 4 MG tablet Take 1 tablet (4 mg total) by mouth every 8 (eight) hours as needed for up to 7 days for nausea or vomiting.   oxyCODONE-acetaminophen (PERCOCET) 7.5-325 MG tablet Take 1 tablet by mouth 5 (five) times daily.   pantoprazole (PROTONIX) 40 MG tablet Take 1 tablet (40 mg total) by mouth 2 (two) times daily.   prazosin (MINIPRESS) 2 MG capsule Take 2 mg by mouth at bedtime.   pregabalin (LYRICA) 100 MG capsule Take 1 capsule (100 mg total) by mouth 2 (two) times daily. (Patient taking differently: Take 100 mg by mouth 3 (three) times daily.)   promethazine (  PHENERGAN) 25 MG tablet Take 1 tablet (25 mg total) by mouth every 8 (eight) hours as needed for nausea or vomiting. (Patient not taking: Reported on 04/06/2022)   solifenacin (VESICARE) 10 MG tablet Take 1 tablet (10 mg total) by mouth daily.   spironolactone (ALDACTONE) 50 MG tablet TAKE ONE TABLET BY MOUTH DAILY   sulfamethoxazole-trimethoprim (BACTRIM DS) 800-160 MG tablet Take 1 tablet by mouth 2 (two) times daily. (Patient not taking: Reported on 04/06/2022)   sulfamethoxazole-trimethoprim (BACTRIM DS) 800-160 MG tablet Take 1 tablet by mouth 2 (two) times daily.   SUMAtriptan (IMITREX) 50  MG tablet TAKE 1 TAB EVERY 2 HOURS AS NEEDED FOR MIGRAINE. MAY REPEAT IN 2 HOURS IF PERSISTS OR RECURS   topiramate (TOPAMAX) 50 MG tablet TAKE ONE TABLET BY MOUTH TWICE DAILY   traZODone (DESYREL) 100 MG tablet Take 200 mg by mouth at bedtime as needed for sleep.   No facility-administered encounter medications on file as of 04/22/2022.   BP Readings from Last 3 Encounters:  04/15/22 106/69  04/08/22 138/84  04/05/22 132/81    Pulse Readings from Last 3 Encounters:  04/15/22 79  04/08/22 90  04/05/22 87    Lab Results  Component Value Date/Time   HGBA1C 5.3 04/08/2022 11:17 AM   HGBA1C 5.6 08/26/2019 06:56 AM   Lab Results  Component Value Date   CREATININE 0.64 04/08/2022   BUN 11 04/08/2022   GFRNONAA >60 04/08/2022   GFRAA 119 06/27/2020   NA 137 04/08/2022   K 4.6 04/08/2022   CALCIUM 9.8 04/08/2022   CO2 22 04/08/2022     Last adherence delivery date: 01/22/22      Patient is due for next adherence delivery on: 05/03/22  Spoke with patient on 04/22/21 and reviewed medications. Patient denied the need for any medications at this time.  This delivery to include: Vials  90 Days   Potassium chloride 20 meq; two tablets once daily Meloxicam 15 mg once daily Solifenacin 10 mg once daily Estradiol 1 mg once daily Cyclobenzaprine 10 mg twice daily as needed Famotidine 40 mg at bedtime Pantoprazole 40 mg twice daily Duloxetine 60 mg once daily Pregabalin 100 mg Levocetirizine 5 mg once daily Lamotrigine '100mg'$  daily Topiramate 50 mg twice daily Prazosin 2 mg at bedtime Haloperidol 5 mg at bedtime Escitalopram 10 mg once daily Metoprolol 25 mg three tablets daily Buspirone 15 mg three times daily Trazodone 100 mg two tablets at bedtime as needed   Patient declined the following medications this month:   Refills requested from providers include: Pantoprazole 40 mg twice daily Meloxicam 15 mg once daily Solifenacin 10 mg once daily Levocetirizine 5 mg once  daily Potassium chloride 20 meq; two tablets once daily   Confirmed delivery date of 05/03/21, advised patient that pharmacy will contact them the morning of delivery.   Any concerns about your medications? No  How often do you forget or accidentally miss a dose? Rarely  Do you use a pillbox? Yes  Is patient in packaging No  If yes  What is the date on your next pill pack?  Any concerns or issues with your packaging?   Recent blood pressure readings are as follows: N/A Recent blood glucose readings are as follows: N/A   Cycle dispensing form sent to Leata Mouse, CPP for review.   [facility]

## 2022-04-26 ENCOUNTER — Other Ambulatory Visit: Payer: Self-pay | Admitting: Family Medicine

## 2022-04-29 ENCOUNTER — Ambulatory Visit: Payer: Medicare Other | Admitting: Podiatry

## 2022-05-05 DIAGNOSIS — M25519 Pain in unspecified shoulder: Secondary | ICD-10-CM | POA: Diagnosis not present

## 2022-05-05 DIAGNOSIS — Z79891 Long term (current) use of opiate analgesic: Secondary | ICD-10-CM | POA: Diagnosis not present

## 2022-05-05 DIAGNOSIS — K5903 Drug induced constipation: Secondary | ICD-10-CM | POA: Diagnosis not present

## 2022-05-10 ENCOUNTER — Telehealth: Payer: 59 | Admitting: Physician Assistant

## 2022-05-10 DIAGNOSIS — L89012 Pressure ulcer of right elbow, stage 2: Secondary | ICD-10-CM | POA: Diagnosis not present

## 2022-05-10 MED ORDER — CEPHALEXIN 500 MG PO CAPS: 500.0000 mg | ORAL_CAPSULE | Freq: Three times a day (TID) | ORAL | 0 refills | Status: AC

## 2022-05-10 NOTE — Patient Instructions (Signed)
[name], thank you for joining Leeanne Rio, PA-C for today's virtual visit.  While this provider is not your primary care provider (PCP), if your PCP is located in our provider database this encounter information will be shared with them immediately following your visit.   [facility] account gives you access to today's visit and all your visits, tests, and labs performed at [facility] " click here if you don't have a [facility] account or go to [email or web address]  Consent: (Patient) [name] [name] provided verbal consent for this virtual visit at the beginning of the encounter.  Current Medications:  Current Outpatient Medications:    acetaminophen (TYLENOL) 500 MG tablet, Take 1 tablet (500 mg total) by mouth every 8 (eight) hours as needed for moderate pain., Disp: 100 tablet, Rfl: 2   albuterol (PROAIR HFA) 108 (90 Base) MCG/ACT inhaler, Inhale 2 puffs = 119mg into the lungs every 6 (six) hours as needed for wheezing or shortness of breath., Disp: 8.5 g, Rfl: 3   aspirin (ASPIRIN CHILDRENS) 81 MG chewable tablet, Chew 1 tablet (81 mg total) by mouth 2 (two) times daily. For 6 weeks for DVT prophylaxis after surgery, Disp: 84 tablet, Rfl: 0   busPIRone (BUSPAR) 15 MG tablet, Take 15 mg by mouth 3 (three) times daily. , Disp: , Rfl: 2   Calcium Carbonate-Vit D-Min (CALCIUM 1200 PO), Take 1,200 mg by mouth daily. , Disp: , Rfl:    celecoxib (CELEBREX) 100 MG capsule, Take 1 capsule (100 mg total) by mouth 2 (two) times daily. For 2 weeks. Then take as needed, Disp: 60 capsule, Rfl: 0   clindamycin (CLEOCIN) 300 MG capsule, Take 1 capsule (300 mg total) by mouth 3 (three) times daily. (Patient not taking: Reported on 04/05/2022), Disp: 21 capsule, Rfl: 0   DULoxetine (CYMBALTA) 60 MG capsule, TAKE ONE CAPSULE BY MOUTH ONCE DAILY, Disp: 90 capsule, Rfl: 0   Erenumab-aooe (AIMOVIG) 70 MG/ML SOAJ, administer 135mUNDER THE SKIN every 30 DAYS,  Disp: 1 mL, Rfl: 5   escitalopram (LEXAPRO) 10 MG tablet, Take 10 mg by mouth daily., Disp: , Rfl:    estradiol (ESTRACE) 1 MG tablet, Take 1 tablet (1 mg total) by mouth at bedtime., Disp: 90 tablet, Rfl: 3   famotidine (PEPCID) 40 MG tablet, TAKE ONE TABLET BY MOUTH EVERYDAY AT BEDTIME, Disp: 90 tablet, Rfl: 0   fluticasone (FLONASE) 50 MCG/ACT nasal spray, SPRAY 2 SPRAYS INTRO EACH NOSTRIL EVERY DAY (Patient taking differently: Place 2 sprays into both nostrils daily as needed for allergies.), Disp: 48 g, Rfl: 1   haloperidol (HALDOL) 5 MG tablet, Take 1 tablet (5 mg total) by mouth at bedtime. For psychosis, Disp: , Rfl:    lamoTRIgine (LAMICTAL) 100 MG tablet, Take 100 mg by mouth daily., Disp: , Rfl:    levocetirizine (XYZAL) 5 MG tablet, TAKE ONE TABLET BY MOUTH EVERY EVENING, Disp: 90 tablet, Rfl: 0   lidocaine (LIDODERM) 5 %, Place 1 patch onto the skin daily as needed (pain). Remove & Discard patch within 12 hours or as directed by MD, Disp: , Rfl:    LINZESS 290 MCG CAPS capsule, TAKE ONE CAPSULE BY MOUTH EVERY MORNING (Patient not taking: Reported on 04/06/2022), Disp: 90 capsule, Rfl: 0   methocarbamol (ROBAXIN-750) 750 MG tablet, Take 1 tablet (750 mg total) by mouth every 8 (eight) hours as needed for muscle spasms., Disp: 20 tablet, Rfl: 0   metoprolol succinate (TOPROL-XL) 25 MG 24  hr tablet, Take 1 tablet (25 mg total) by mouth daily., Disp: 90 tablet, Rfl: 3   MOVANTIK 25 MG TABS tablet, Take 25 mg by mouth daily., Disp: , Rfl:    Multiple Vitamins-Minerals (MULTIVITAMIN WITH MINERALS) tablet, Take 1 tablet by mouth daily., Disp: , Rfl:    naloxone (NARCAN) nasal spray 4 mg/0.1 mL, Place 0.4 mg into the nose once., Disp: , Rfl:    oxyCODONE-acetaminophen (PERCOCET) 7.5-325 MG tablet, Take 1 tablet by mouth 5 (five) times daily., Disp: , Rfl:    pantoprazole (PROTONIX) 40 MG tablet, Take 1 tablet (40 mg total) by mouth 2 (two) times daily., Disp: 180 tablet, Rfl: 3   prazosin  (MINIPRESS) 2 MG capsule, Take 2 mg by mouth at bedtime., Disp: , Rfl:    pregabalin (LYRICA) 100 MG capsule, Take 1 capsule (100 mg total) by mouth 2 (two) times daily. (Patient taking differently: Take 100 mg by mouth 3 (three) times daily.), Disp: 90 capsule, Rfl: 3   promethazine (PHENERGAN) 25 MG tablet, Take 1 tablet (25 mg total) by mouth every 8 (eight) hours as needed for nausea or vomiting. (Patient not taking: Reported on 04/06/2022), Disp: 20 tablet, Rfl: 0   solifenacin (VESICARE) 10 MG tablet, TAKE ONE TABLET BY MOUTH DAILY, Disp: 90 tablet, Rfl: 0   spironolactone (ALDACTONE) 50 MG tablet, TAKE ONE TABLET BY MOUTH DAILY, Disp: 30 tablet, Rfl: 1   sulfamethoxazole-trimethoprim (BACTRIM DS) 800-160 MG tablet, Take 1 tablet by mouth 2 (two) times daily. (Patient not taking: Reported on 04/06/2022), Disp: 14 tablet, Rfl: 0   sulfamethoxazole-trimethoprim (BACTRIM DS) 800-160 MG tablet, Take 1 tablet by mouth 2 (two) times daily., Disp: 20 tablet, Rfl: 0   SUMAtriptan (IMITREX) 50 MG tablet, TAKE 1 TAB EVERY 2 HOURS AS NEEDED FOR MIGRAINE. MAY REPEAT IN 2 HOURS IF PERSISTS OR RECURS, Disp: 10 tablet, Rfl: 3   topiramate (TOPAMAX) 50 MG tablet, TAKE ONE TABLET BY MOUTH TWICE DAILY, Disp: 180 tablet, Rfl: 0   traZODone (DESYREL) 100 MG tablet, Take 200 mg by mouth at bedtime as needed for sleep., Disp: , Rfl: 0   Medications ordered in this encounter:  No orders of the defined types were placed in this encounter.    *If you need refills on other medications prior to your next appointment, please contact your pharmacy*  Follow-Up: Call back or seek an in-person evaluation if the symptoms worsen or if the condition fails to improve as anticipated.  [facility] [phone]  Other Instructions Please keep the skin clean with warm, clean water and small amount of soap. Pat completely dry. You can use an OTC barrier cream to help protect the area. You need to take the  antibiotic as directed. Contact your surgeon first thing in the morning to make them aware so they can get you in for a follow-up and make adjustments in your shoulder sling. If unavailable, please reach out to your PCP. Do not delay care.     If you have been instructed to have an in-person evaluation today at a local Urgent Care facility, please use the link below. It will take you to a list of all of our available [facility], including address, phone number and hours of operation. Please do not delay care.   Urgent Cares  If you or a family member do not have a primary care provider, use the link below to schedule a visit and establish care. When you choose  a [facility] primary care physician or advanced practice provider, you gain a long-term partner in health. Find a Primary Care Provider  Learn more about [facility]'s in-office and virtual care options: [facility]

## 2022-05-10 NOTE — Progress Notes (Signed)
Virtual Visit Consent   VANNESSA GODOWN, you are scheduled for a virtual visit with a [facility] provider today. Just as with appointments in the office, your consent must be obtained to participate. Your consent will be active for this visit and any virtual visit you may have with one of our providers in the next 365 days. If you have a MyChart account, a copy of this consent can be sent to you electronically.  As this is a virtual visit, video technology does not allow for your provider to perform a traditional examination. This may limit your provider's ability to fully assess your condition. If your provider identifies any concerns that need to be evaluated in person or the need to arrange testing (such as labs, EKG, etc.), we will make arrangements to do so. Although advances in technology are sophisticated, we cannot ensure that it will always work on either your end or our end. If the connection with a video visit is poor, the visit may have to be switched to a telephone visit. With either a video or telephone visit, we are not always able to ensure that we have a secure connection.  By engaging in this virtual visit, you consent to the provision of healthcare and authorize for your insurance to be billed (if applicable) for the services provided during this visit. Depending on your insurance coverage, you may receive a charge related to this service.  I need to obtain your verbal consent now. Are you willing to proceed with your visit today? RASHEEN SCHEWE has provided verbal consent on 05/10/2022 for a virtual visit (video or telephone). Leeanne Rio, [place]  Date: 05/10/2022 6:18 PM  Virtual Visit via Video Note   I, Leeanne Rio, connected with  KAYSA ROULHAC  ([id number], 31-Oct-1965) on 05/10/22 at  6:00 PM EST by a video-enabled telemedicine application and verified that I am speaking with the correct person using two identifiers.  Location: Patient: Virtual Visit  Location Patient: Home Provider: Virtual Visit Location Provider: Home Office   I discussed the limitations of evaluation and management by telemedicine and the availability of in person appointments. The patient expressed understanding and agreed to proceed.    History of Present Illness: JULISA FLIPPO is a 57 y.o. who identifies as a female who was assigned female at birth, and is being seen today for concern of a sore developing on her R elbow. Notes she recently had a R shoulder replacement. As such, her shoulder is in a sling. Notes this has rubbed against her elbow so she put some tissue/gauze in the sling to help but is has not. Now noting a sore in the middle of an area of redness overlying her elbow. Notes some drainage from the area. Denies fever, chills. Is able to move her elbow fully but is not the most comfortable. Has not reached out to her surgeon or PCP.  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   Diarrhea in adult patient    Weakness 06/30/2021   Gastroenteritis, infectious 06/30/2021   AKI (acute kidney injury) ([place]) 06/30/2021   Hyponatremia 06/30/2021   Open wound of right foot, initial encounter 10/21/2020   Finger ulcer ([place]) 10/21/2020   Syncope 08/26/2019   Non-healing open wound of toe 05/15/2019   Osteomyelitis ([place]) 05/15/2019   Palpitations 10/26/2018   Foot ulcer ([place]) 01/31/2018   Hyperglycemia  01/31/2018   Left leg cellulitis 09/24/2017   Syncope and collapse    Near syncope 11/23/2016   Abdominal pain 09/23/2016   Anxiety 09/23/2016   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[place]) 09/23/2016   Constipation 09/23/2016   DDD (degenerative disc disease), lumbosacral 09/23/2016   Migraines 09/23/2016   Myelopathy ([place]) 09/23/2016   Nausea 09/23/2016   Post laminectomy syndrome 09/23/2016   Pseudoarthrosis of lumbar spine 09/23/2016   Shortness of breath  09/23/2016   Sleep apnea 09/23/2016   Spinal stenosis of lumbar region 09/23/2016   Vertigo 09/23/2016   Hallux malleus 11/15/2015   Chronic pain syndrome    GERD (gastroesophageal reflux disease)    Depression    Rash 11/14/2015   Toe ulcer, right ([place]) 04/11/2015   Cellulitis of toe of right foot 04/11/2015   Nonspecific chest pain    Chest pain 10/26/2014   Obesity (BMI 30-39.9) 10/26/2014   Hypokalemia 10/26/2014   Manic bipolar I disorder ([place]) 10/26/2014   Atypical angina 10/26/2014   Tardive dyskinesia 08/26/2011   Manic bipolar I disorder with rapid cycling ([place]) 05/18/2011    Class: Acute   Asthma 02/24/2011   Essential hypertension 02/24/2011   History of migraine headaches 02/24/2011    Allergies:  Allergies  Allergen Reactions   Other    Penicillins Hives and Rash    No anaphylactic or severe cutaneous Sx > 10 yr ago, no additional medical attention required Tolerates Keflex and Rocephin   Tetracyclines & Related Other (See Comments)    Syncope and put her "in a coma"   Tramadol Other (See Comments)    Seizures   Phenazopyridine Other (See Comments)    Unknown   Tetracycline Other (See Comments)    Syncope and "Put me in a coma"   Ciprofloxacin Rash and Itching   Codeine Itching and Rash   Estradiol Rash    Patches broke out the skin   Medications:  Current Outpatient Medications:    cephALEXin (KEFLEX) 500 MG capsule, Take 1 capsule (500 mg total) by mouth 3 (three) times daily for 7 days., Disp: 21 capsule, Rfl: 0   acetaminophen (TYLENOL) 500 MG tablet, Take 1 tablet (500 mg total) by mouth every 8 (eight) hours as needed for moderate pain., Disp: 100 tablet, Rfl: 2   albuterol (PROAIR HFA) 108 (90 Base) MCG/ACT inhaler, Inhale 2 puffs = 138mg into the lungs every 6 (six) hours as needed for wheezing or shortness of breath., Disp: 8.5 g, Rfl: 3   aspirin (ASPIRIN CHILDRENS) 81 MG chewable tablet, Chew 1 tablet (81 mg total) by mouth 2 (two) times  daily. For 6 weeks for DVT prophylaxis after surgery, Disp: 84 tablet, Rfl: 0   busPIRone (BUSPAR) 15 MG tablet, Take 15 mg by mouth 3 (three) times daily. , Disp: , Rfl: 2   Calcium Carbonate-Vit D-Min (CALCIUM 1200 PO), Take 1,200 mg by mouth daily. , Disp: , Rfl:    celecoxib (CELEBREX) 100 MG capsule, Take 1 capsule (100 mg total) by mouth 2 (two) times daily. For 2 weeks. Then take as needed, Disp: 60 capsule, Rfl: 0   DULoxetine (CYMBALTA) 60 MG capsule, TAKE ONE CAPSULE BY MOUTH ONCE DAILY, Disp: 90 capsule, Rfl: 0   Erenumab-aooe (AIMOVIG) 70 MG/ML SOAJ, administer 151mUNDER THE SKIN every 30 DAYS, Disp: 1 mL, Rfl: 5   escitalopram (LEXAPRO) 10 MG tablet, Take 10 mg by mouth daily., Disp: , Rfl:    estradiol (ESTRACE) 1 MG tablet, Take 1  tablet (1 mg total) by mouth at bedtime., Disp: 90 tablet, Rfl: 3   famotidine (PEPCID) 40 MG tablet, TAKE ONE TABLET BY MOUTH EVERYDAY AT BEDTIME, Disp: 90 tablet, Rfl: 0   haloperidol (HALDOL) 5 MG tablet, Take 1 tablet (5 mg total) by mouth at bedtime. For psychosis, Disp: , Rfl:    lamoTRIgine (LAMICTAL) 100 MG tablet, Take 100 mg by mouth daily., Disp: , Rfl:    levocetirizine (XYZAL) 5 MG tablet, TAKE ONE TABLET BY MOUTH EVERY EVENING, Disp: 90 tablet, Rfl: 0   lidocaine (LIDODERM) 5 %, Place 1 patch onto the skin daily as needed (pain). Remove & Discard patch within 12 hours or as directed by MD, Disp: , Rfl:    LINZESS 290 MCG CAPS capsule, TAKE ONE CAPSULE BY MOUTH EVERY MORNING (Patient not taking: Reported on 04/06/2022), Disp: 90 capsule, Rfl: 0   methocarbamol (ROBAXIN-750) 750 MG tablet, Take 1 tablet (750 mg total) by mouth every 8 (eight) hours as needed for muscle spasms., Disp: 20 tablet, Rfl: 0   metoprolol succinate (TOPROL-XL) 25 MG 24 hr tablet, Take 1 tablet (25 mg total) by mouth daily., Disp: 90 tablet, Rfl: 3   MOVANTIK 25 MG TABS tablet, Take 25 mg by mouth daily., Disp: , Rfl:    Multiple Vitamins-Minerals (MULTIVITAMIN WITH  MINERALS) tablet, Take 1 tablet by mouth daily., Disp: , Rfl:    naloxone (NARCAN) nasal spray 4 mg/0.1 mL, Place 0.4 mg into the nose once., Disp: , Rfl:    oxyCODONE-acetaminophen (PERCOCET) 7.5-325 MG tablet, Take 1 tablet by mouth 5 (five) times daily., Disp: , Rfl:    pantoprazole (PROTONIX) 40 MG tablet, Take 1 tablet (40 mg total) by mouth 2 (two) times daily., Disp: 180 tablet, Rfl: 3   prazosin (MINIPRESS) 2 MG capsule, Take 2 mg by mouth at bedtime., Disp: , Rfl:    pregabalin (LYRICA) 100 MG capsule, Take 1 capsule (100 mg total) by mouth 2 (two) times daily. (Patient taking differently: Take 100 mg by mouth 3 (three) times daily.), Disp: 90 capsule, Rfl: 3   promethazine (PHENERGAN) 25 MG tablet, Take 1 tablet (25 mg total) by mouth every 8 (eight) hours as needed for nausea or vomiting. (Patient not taking: Reported on 04/06/2022), Disp: 20 tablet, Rfl: 0   solifenacin (VESICARE) 10 MG tablet, TAKE ONE TABLET BY MOUTH DAILY, Disp: 90 tablet, Rfl: 0   spironolactone (ALDACTONE) 50 MG tablet, TAKE ONE TABLET BY MOUTH DAILY, Disp: 30 tablet, Rfl: 1   SUMAtriptan (IMITREX) 50 MG tablet, TAKE 1 TAB EVERY 2 HOURS AS NEEDED FOR MIGRAINE. MAY REPEAT IN 2 HOURS IF PERSISTS OR RECURS, Disp: 10 tablet, Rfl: 3   topiramate (TOPAMAX) 50 MG tablet, TAKE ONE TABLET BY MOUTH TWICE DAILY, Disp: 180 tablet, Rfl: 0   traZODone (DESYREL) 100 MG tablet, Take 200 mg by mouth at bedtime as needed for sleep., Disp: , Rfl: 0  Observations/Objective: Patient is well-developed, well-nourished in no acute distress.  Resting comfortably at home.  Head is normocephalic, atraumatic.  No labored breathing. Speech is clear and coherent with logical content.  Patient is alert and oriented at baseline.  R elbow with full ROM. There is a 3 x 3 cm area of erythema with central superficial ulceration noted, overlying the olecranon process. No streaking noted. Some milder surrounding erythema noted.  Assessment and  Plan: 1. Pressure injury of right elbow, stage 2 ([name]) - cephALEXin (KEFLEX) 500 MG capsule; Take 1 capsule (500 mg total) by  mouth 3 (three) times daily for 7 days.  Dispense: 21 capsule; Refill: 0  With likely infection. Thankfully patient afebrile and normal ROM of affected joint. Supportive measures and wound cleansing/care discussed. Start Keflex 500 mg TID x 7 days. She is to contact her surgeon's office first thing in the morning to schedule follow-up for further evaluation, especially since she still has a few more weeks of needing to wear the sling -- needs adjustment/replacement. ER precautions reviewed.   Follow Up Instructions: I discussed the assessment and treatment plan with the patient. The patient was provided an opportunity to ask questions and all were answered. The patient agreed with the plan and demonstrated an understanding of the instructions.  A copy of instructions were sent to the patient via MyChart unless otherwise noted below.   The patient was advised to call back or seek an in-person evaluation if the symptoms worsen or if the condition fails to improve as anticipated.  Time:  I spent 12 minutes with the patient via telehealth technology discussing the above problems/concerns.    Leeanne Rio, PA-C

## 2022-05-13 DIAGNOSIS — M19011 Primary osteoarthritis, right shoulder: Secondary | ICD-10-CM | POA: Diagnosis not present

## 2022-05-13 DIAGNOSIS — M25512 Pain in left shoulder: Secondary | ICD-10-CM | POA: Diagnosis not present

## 2022-05-13 DIAGNOSIS — M25521 Pain in right elbow: Secondary | ICD-10-CM | POA: Diagnosis not present

## 2022-05-15 DIAGNOSIS — M961 Postlaminectomy syndrome, not elsewhere classified: Secondary | ICD-10-CM | POA: Diagnosis not present

## 2022-05-15 DIAGNOSIS — M25552 Pain in left hip: Secondary | ICD-10-CM | POA: Diagnosis not present

## 2022-05-15 DIAGNOSIS — M25511 Pain in right shoulder: Secondary | ICD-10-CM | POA: Diagnosis not present

## 2022-05-15 DIAGNOSIS — M25561 Pain in right knee: Secondary | ICD-10-CM | POA: Diagnosis not present

## 2022-05-20 ENCOUNTER — Telehealth: Payer: Self-pay | Admitting: Pharmacist

## 2022-05-20 DIAGNOSIS — M19011 Primary osteoarthritis, right shoulder: Secondary | ICD-10-CM | POA: Diagnosis not present

## 2022-05-20 NOTE — Progress Notes (Signed)
Care Management & Coordination Services Pharmacy Team  Reason for Encounter: Medication coordination and delivery  Contacted patient to discuss medications and coordinate delivery from [facility]. Spoke with patient on 05/20/2022  Cycle dispensing form sent to [facility] for review.   Last adherence delivery date: 05/03/2022      Patient is due for next adherence delivery on: 06/01/2022  This delivery to include: Vials  30 Days   Aimovig 70 mg/mL once a month Meloxicam 15 mg once daily Solifenacin 10 mg once daily Estradiol 1 mg once daily Cyclobenzaprine 10 mg twice daily as needed Famotidine 40 mg at bedtime Pantoprazole 40 mg twice daily Duloxetine 60 mg once daily Pregabalin 100 mg Levocetirizine 5 mg once daily Lamotrigine '100mg'$  daily Topiramate 50 mg twice daily Prazosin 2 mg at bedtime Haloperidol 5 mg at bedtime Escitalopram 10 mg once daily Metoprolol 25 mg three tablets daily Buspirone 15 mg three times daily Trazodone 100 mg two tablets at bedtime as needed Spironolactone 50 mg once daily   No refill request needed.  Confirmed delivery date of 06/01/2022, advised patient that pharmacy will contact them the morning of delivery.   Any concerns about your medications? No  How often do you forget or accidentally miss a dose? Never  Do you use a pillbox? Yes  Is patient in packaging No  If yes  What is the date on your next pill pack?  Any concerns or issues with your packaging?   Chart review: Recent office visits:  05/10/2022 [facility] Brunetta Jeans, PA-C; Keflex 500 mg 3 times daily for 7 days.  Recent consult visits:  None  Hospital visits:  None in previous 6 months  Medications: Outpatient Encounter Medications as of 05/20/2022  Medication Sig   acetaminophen (TYLENOL) 500 MG tablet Take 1 tablet (500 mg total) by mouth every 8 (eight) hours as needed for moderate pain.   albuterol (PROAIR HFA) 108 (90 Base) MCG/ACT inhaler  Inhale 2 puffs = 193mg into the lungs every 6 (six) hours as needed for wheezing or shortness of breath.   aspirin (ASPIRIN CHILDRENS) 81 MG chewable tablet Chew 1 tablet (81 mg total) by mouth 2 (two) times daily. For 6 weeks for DVT prophylaxis after surgery   busPIRone (BUSPAR) 15 MG tablet Take 15 mg by mouth 3 (three) times daily.    Calcium Carbonate-Vit D-Min (CALCIUM 1200 PO) Take 1,200 mg by mouth daily.    DULoxetine (CYMBALTA) 60 MG capsule TAKE ONE CAPSULE BY MOUTH ONCE DAILY   Erenumab-aooe (AIMOVIG) 70 MG/ML SOAJ administer 146mUNDER THE SKIN every 30 DAYS   escitalopram (LEXAPRO) 10 MG tablet Take 10 mg by mouth daily.   estradiol (ESTRACE) 1 MG tablet Take 1 tablet (1 mg total) by mouth at bedtime.   famotidine (PEPCID) 40 MG tablet TAKE ONE TABLET BY MOUTH EVERYDAY AT BEDTIME   haloperidol (HALDOL) 5 MG tablet Take 1 tablet (5 mg total) by mouth at bedtime. For psychosis   lamoTRIgine (LAMICTAL) 100 MG tablet Take 100 mg by mouth daily.   levocetirizine (XYZAL) 5 MG tablet TAKE ONE TABLET BY MOUTH EVERY EVENING   lidocaine (LIDODERM) 5 % Place 1 patch onto the skin daily as needed (pain). Remove & Discard patch within 12 hours or as directed by MD   LINZESS 290 MCG CAPS capsule TAKE ONE CAPSULE BY MOUTH EVERY MORNING (Patient not taking: Reported on 04/06/2022)   methocarbamol (ROBAXIN-750) 750 MG tablet Take 1 tablet (750 mg total) by  mouth every 8 (eight) hours as needed for muscle spasms.   metoprolol succinate (TOPROL-XL) 25 MG 24 hr tablet Take 1 tablet (25 mg total) by mouth daily.   MOVANTIK 25 MG TABS tablet Take 25 mg by mouth daily.   Multiple Vitamins-Minerals (MULTIVITAMIN WITH MINERALS) tablet Take 1 tablet by mouth daily.   naloxone (NARCAN) nasal spray 4 mg/0.1 mL Place 0.4 mg into the nose once.   oxyCODONE-acetaminophen (PERCOCET) 7.5-325 MG tablet Take 1 tablet by mouth 5 (five) times daily.   pantoprazole (PROTONIX) 40 MG tablet Take 1 tablet (40 mg total) by  mouth 2 (two) times daily.   prazosin (MINIPRESS) 2 MG capsule Take 2 mg by mouth at bedtime.   pregabalin (LYRICA) 100 MG capsule Take 1 capsule (100 mg total) by mouth 2 (two) times daily. (Patient taking differently: Take 100 mg by mouth 3 (three) times daily.)   promethazine (PHENERGAN) 25 MG tablet Take 1 tablet (25 mg total) by mouth every 8 (eight) hours as needed for nausea or vomiting. (Patient not taking: Reported on 04/06/2022)   solifenacin (VESICARE) 10 MG tablet TAKE ONE TABLET BY MOUTH DAILY   spironolactone (ALDACTONE) 50 MG tablet TAKE ONE TABLET BY MOUTH DAILY   SUMAtriptan (IMITREX) 50 MG tablet TAKE 1 TAB EVERY 2 HOURS AS NEEDED FOR MIGRAINE. MAY REPEAT IN 2 HOURS IF PERSISTS OR RECURS   topiramate (TOPAMAX) 50 MG tablet TAKE ONE TABLET BY MOUTH TWICE DAILY   traZODone (DESYREL) 100 MG tablet Take 200 mg by mouth at bedtime as needed for sleep.   No facility-administered encounter medications on file as of 05/20/2022.   BP Readings from Last 3 Encounters:  04/15/22 106/69  04/08/22 138/84  04/05/22 132/81    Pulse Readings from Last 3 Encounters:  04/15/22 79  04/08/22 90  04/05/22 87    Lab Results  Component Value Date/Time   HGBA1C 5.3 04/08/2022 11:17 AM   HGBA1C 5.6 08/26/2019 06:56 AM   Lab Results  Component Value Date   CREATININE 0.64 04/08/2022   BUN 11 04/08/2022   GFRNONAA >60 04/08/2022   GFRAA 119 06/27/2020   NA 137 04/08/2022   K 4.6 04/08/2022   CALCIUM 9.8 04/08/2022   CO2 22 04/08/2022    Future Appointments  Date Time Provider [place]  05/31/2022  3:15 PM [name], DPM [facility]  06/15/2022  1:00 PM [facility] None  06/17/2022  2:45 PM Edythe Clarity, [place] None   April D Calhoun, [place] Pharmacist Assistant [phone]

## 2022-05-21 ENCOUNTER — Other Ambulatory Visit: Payer: Self-pay

## 2022-05-21 ENCOUNTER — Inpatient Hospital Stay
Admission: EM | Admit: 2022-05-21 | Discharge: 2022-06-15 | DRG: 853 | Disposition: A | Discharge status: Home Health | Payer: 59 | Attending: Internal Medicine | Admitting: Internal Medicine

## 2022-05-21 DIAGNOSIS — Z79890 Hormone replacement therapy (postmenopausal): Secondary | ICD-10-CM

## 2022-05-21 DIAGNOSIS — Z825 Family history of asthma and other chronic lower respiratory diseases: Secondary | ICD-10-CM

## 2022-05-21 DIAGNOSIS — N390 Urinary tract infection, site not specified: Secondary | ICD-10-CM | POA: Diagnosis not present

## 2022-05-21 DIAGNOSIS — Z8249 Family history of ischemic heart disease and other diseases of the circulatory system: Secondary | ICD-10-CM

## 2022-05-21 DIAGNOSIS — B9562 Methicillin resistant Staphylococcus aureus infection as the cause of diseases classified elsewhere: Secondary | ICD-10-CM | POA: Diagnosis present

## 2022-05-21 DIAGNOSIS — G40909 Epilepsy, unspecified, not intractable, without status epilepticus: Secondary | ICD-10-CM | POA: Diagnosis present

## 2022-05-21 DIAGNOSIS — E875 Hyperkalemia: Secondary | ICD-10-CM | POA: Diagnosis not present

## 2022-05-21 DIAGNOSIS — F603 Borderline personality disorder: Secondary | ICD-10-CM | POA: Diagnosis present

## 2022-05-21 DIAGNOSIS — J45909 Unspecified asthma, uncomplicated: Secondary | ICD-10-CM | POA: Diagnosis not present

## 2022-05-21 DIAGNOSIS — R4182 Altered mental status, unspecified: Secondary | ICD-10-CM | POA: Diagnosis present

## 2022-05-21 DIAGNOSIS — M1990 Unspecified osteoarthritis, unspecified site: Secondary | ICD-10-CM | POA: Diagnosis present

## 2022-05-21 DIAGNOSIS — R6889 Other general symptoms and signs: Secondary | ICD-10-CM | POA: Diagnosis not present

## 2022-05-21 DIAGNOSIS — J4520 Mild intermittent asthma, uncomplicated: Secondary | ICD-10-CM | POA: Diagnosis present

## 2022-05-21 DIAGNOSIS — G928 Other toxic encephalopathy: Secondary | ICD-10-CM | POA: Diagnosis not present

## 2022-05-21 DIAGNOSIS — Z9889 Other specified postprocedural states: Secondary | ICD-10-CM | POA: Diagnosis not present

## 2022-05-21 DIAGNOSIS — L03031 Cellulitis of right toe: Secondary | ICD-10-CM | POA: Diagnosis not present

## 2022-05-21 DIAGNOSIS — R509 Fever, unspecified: Secondary | ICD-10-CM | POA: Diagnosis not present

## 2022-05-21 DIAGNOSIS — L03115 Cellulitis of right lower limb: Secondary | ICD-10-CM | POA: Diagnosis present

## 2022-05-21 DIAGNOSIS — Z6841 Body Mass Index (BMI) 40.0 and over, adult: Secondary | ICD-10-CM

## 2022-05-21 DIAGNOSIS — R000 Tachycardia, unspecified: Secondary | ICD-10-CM | POA: Diagnosis not present

## 2022-05-21 DIAGNOSIS — T8743 Infection of amputation stump, right lower extremity: Secondary | ICD-10-CM | POA: Diagnosis present

## 2022-05-21 DIAGNOSIS — M961 Postlaminectomy syndrome, not elsewhere classified: Secondary | ICD-10-CM | POA: Diagnosis not present

## 2022-05-21 DIAGNOSIS — L02611 Cutaneous abscess of right foot: Secondary | ICD-10-CM | POA: Diagnosis not present

## 2022-05-21 DIAGNOSIS — K3184 Gastroparesis: Secondary | ICD-10-CM | POA: Diagnosis not present

## 2022-05-21 DIAGNOSIS — R404 Transient alteration of awareness: Secondary | ICD-10-CM | POA: Diagnosis not present

## 2022-05-21 DIAGNOSIS — Z1152 Encounter for screening for COVID-19: Secondary | ICD-10-CM | POA: Diagnosis not present

## 2022-05-21 DIAGNOSIS — R109 Unspecified abdominal pain: Secondary | ICD-10-CM | POA: Diagnosis not present

## 2022-05-21 DIAGNOSIS — G4733 Obstructive sleep apnea (adult) (pediatric): Secondary | ICD-10-CM | POA: Diagnosis present

## 2022-05-21 DIAGNOSIS — E1169 Type 2 diabetes mellitus with other specified complication: Secondary | ICD-10-CM | POA: Diagnosis present

## 2022-05-21 DIAGNOSIS — M545 Low back pain, unspecified: Secondary | ICD-10-CM | POA: Diagnosis not present

## 2022-05-21 DIAGNOSIS — F419 Anxiety disorder, unspecified: Secondary | ICD-10-CM | POA: Diagnosis present

## 2022-05-21 DIAGNOSIS — R0689 Other abnormalities of breathing: Secondary | ICD-10-CM | POA: Diagnosis not present

## 2022-05-21 DIAGNOSIS — Z743 Need for continuous supervision: Secondary | ICD-10-CM | POA: Diagnosis not present

## 2022-05-21 DIAGNOSIS — M25511 Pain in right shoulder: Secondary | ICD-10-CM | POA: Diagnosis not present

## 2022-05-21 DIAGNOSIS — T50915A Adverse effect of multiple unspecified drugs, medicaments and biological substances, initial encounter: Secondary | ICD-10-CM | POA: Diagnosis present

## 2022-05-21 DIAGNOSIS — Z888 Allergy status to other drugs, medicaments and biological substances status: Secondary | ICD-10-CM

## 2022-05-21 DIAGNOSIS — A419 Sepsis, unspecified organism: Principal | ICD-10-CM | POA: Diagnosis present

## 2022-05-21 DIAGNOSIS — Z885 Allergy status to narcotic agent status: Secondary | ICD-10-CM

## 2022-05-21 DIAGNOSIS — G9341 Metabolic encephalopathy: Secondary | ICD-10-CM | POA: Diagnosis present

## 2022-05-21 DIAGNOSIS — R600 Localized edema: Secondary | ICD-10-CM | POA: Diagnosis not present

## 2022-05-21 DIAGNOSIS — Y835 Amputation of limb(s) as the cause of abnormal reaction of the patient, or of later complication, without mention of misadventure at the time of the procedure: Secondary | ICD-10-CM | POA: Diagnosis present

## 2022-05-21 DIAGNOSIS — E11621 Type 2 diabetes mellitus with foot ulcer: Secondary | ICD-10-CM | POA: Diagnosis not present

## 2022-05-21 DIAGNOSIS — R1033 Periumbilical pain: Secondary | ICD-10-CM | POA: Diagnosis not present

## 2022-05-21 DIAGNOSIS — R2689 Other abnormalities of gait and mobility: Secondary | ICD-10-CM | POA: Diagnosis not present

## 2022-05-21 DIAGNOSIS — R531 Weakness: Secondary | ICD-10-CM | POA: Diagnosis not present

## 2022-05-21 DIAGNOSIS — T368X5A Adverse effect of other systemic antibiotics, initial encounter: Secondary | ICD-10-CM | POA: Diagnosis not present

## 2022-05-21 DIAGNOSIS — N179 Acute kidney failure, unspecified: Secondary | ICD-10-CM | POA: Diagnosis present

## 2022-05-21 DIAGNOSIS — S98911A Complete traumatic amputation of right foot, level unspecified, initial encounter: Secondary | ICD-10-CM | POA: Diagnosis not present

## 2022-05-21 DIAGNOSIS — R6520 Severe sepsis without septic shock: Secondary | ICD-10-CM | POA: Diagnosis not present

## 2022-05-21 DIAGNOSIS — F32A Depression, unspecified: Secondary | ICD-10-CM | POA: Diagnosis present

## 2022-05-21 DIAGNOSIS — L089 Local infection of the skin and subcutaneous tissue, unspecified: Secondary | ICD-10-CM | POA: Insufficient documentation

## 2022-05-21 DIAGNOSIS — Z481 Encounter for planned postprocedural wound closure: Secondary | ICD-10-CM | POA: Diagnosis not present

## 2022-05-21 DIAGNOSIS — E871 Hypo-osmolality and hyponatremia: Secondary | ICD-10-CM | POA: Diagnosis present

## 2022-05-21 DIAGNOSIS — M7730 Calcaneal spur, unspecified foot: Secondary | ICD-10-CM | POA: Diagnosis present

## 2022-05-21 DIAGNOSIS — K219 Gastro-esophageal reflux disease without esophagitis: Secondary | ICD-10-CM | POA: Diagnosis not present

## 2022-05-21 DIAGNOSIS — Z90710 Acquired absence of both cervix and uterus: Secondary | ICD-10-CM

## 2022-05-21 DIAGNOSIS — M869 Osteomyelitis, unspecified: Secondary | ICD-10-CM | POA: Diagnosis not present

## 2022-05-21 DIAGNOSIS — Z881 Allergy status to other antibiotic agents status: Secondary | ICD-10-CM

## 2022-05-21 DIAGNOSIS — E1143 Type 2 diabetes mellitus with diabetic autonomic (poly)neuropathy: Secondary | ICD-10-CM | POA: Diagnosis present

## 2022-05-21 DIAGNOSIS — L97519 Non-pressure chronic ulcer of other part of right foot with unspecified severity: Secondary | ICD-10-CM | POA: Diagnosis not present

## 2022-05-21 DIAGNOSIS — R410 Disorientation, unspecified: Secondary | ICD-10-CM | POA: Diagnosis not present

## 2022-05-21 DIAGNOSIS — J90 Pleural effusion, not elsewhere classified: Secondary | ICD-10-CM | POA: Diagnosis present

## 2022-05-21 DIAGNOSIS — R29818 Other symptoms and signs involving the nervous system: Secondary | ICD-10-CM | POA: Diagnosis not present

## 2022-05-21 DIAGNOSIS — Z79891 Long term (current) use of opiate analgesic: Secondary | ICD-10-CM

## 2022-05-21 DIAGNOSIS — Z7982 Long term (current) use of aspirin: Secondary | ICD-10-CM

## 2022-05-21 DIAGNOSIS — Z79899 Other long term (current) drug therapy: Secondary | ICD-10-CM

## 2022-05-21 DIAGNOSIS — R6521 Severe sepsis with septic shock: Secondary | ICD-10-CM | POA: Diagnosis not present

## 2022-05-21 DIAGNOSIS — Z833 Family history of diabetes mellitus: Secondary | ICD-10-CM

## 2022-05-21 DIAGNOSIS — F319 Bipolar disorder, unspecified: Secondary | ICD-10-CM | POA: Diagnosis present

## 2022-05-21 DIAGNOSIS — D6489 Other specified anemias: Secondary | ICD-10-CM | POA: Diagnosis present

## 2022-05-21 DIAGNOSIS — R601 Generalized edema: Secondary | ICD-10-CM | POA: Diagnosis present

## 2022-05-21 DIAGNOSIS — G894 Chronic pain syndrome: Secondary | ICD-10-CM | POA: Diagnosis present

## 2022-05-21 DIAGNOSIS — Z89422 Acquired absence of other left toe(s): Secondary | ICD-10-CM

## 2022-05-21 DIAGNOSIS — L270 Generalized skin eruption due to drugs and medicaments taken internally: Secondary | ICD-10-CM | POA: Diagnosis not present

## 2022-05-21 DIAGNOSIS — M729 Fibroblastic disorder, unspecified: Secondary | ICD-10-CM | POA: Diagnosis present

## 2022-05-21 DIAGNOSIS — M25552 Pain in left hip: Secondary | ICD-10-CM | POA: Diagnosis not present

## 2022-05-21 DIAGNOSIS — I10 Essential (primary) hypertension: Secondary | ICD-10-CM | POA: Diagnosis present

## 2022-05-21 DIAGNOSIS — S91301A Unspecified open wound, right foot, initial encounter: Secondary | ICD-10-CM | POA: Diagnosis not present

## 2022-05-21 DIAGNOSIS — R569 Unspecified convulsions: Secondary | ICD-10-CM | POA: Diagnosis not present

## 2022-05-21 DIAGNOSIS — Z96611 Presence of right artificial shoulder joint: Secondary | ICD-10-CM | POA: Diagnosis present

## 2022-05-21 DIAGNOSIS — Z880 Allergy status to penicillin: Secondary | ICD-10-CM

## 2022-05-21 DIAGNOSIS — G4730 Sleep apnea, unspecified: Secondary | ICD-10-CM | POA: Diagnosis not present

## 2022-05-21 DIAGNOSIS — Z9049 Acquired absence of other specified parts of digestive tract: Secondary | ICD-10-CM

## 2022-05-21 DIAGNOSIS — M25561 Pain in right knee: Secondary | ICD-10-CM | POA: Diagnosis not present

## 2022-05-21 DIAGNOSIS — E8721 Acute metabolic acidosis: Secondary | ICD-10-CM | POA: Diagnosis not present

## 2022-05-21 DIAGNOSIS — N182 Chronic kidney disease, stage 2 (mild): Secondary | ICD-10-CM | POA: Diagnosis present

## 2022-05-21 MED ORDER — VANCOMYCIN HCL IN DEXTROSE 1-5 GM/200ML-% IV SOLN
1000.0000 mg | Freq: Once | INTRAVENOUS | Status: AC
  Administered 2022-05-22: 1000 mg via INTRAVENOUS
  Filled 2022-05-21: qty 200

## 2022-05-21 MED ORDER — SODIUM CHLORIDE 0.9 % IV SOLN
2.0000 g | INTRAVENOUS | Status: AC
  Administered 2022-05-22: 2 g via INTRAVENOUS
  Filled 2022-05-21: qty 20

## 2022-05-21 MED ORDER — SODIUM CHLORIDE 0.9 % IV SOLN: 2.0000 g | Freq: Once | INTRAVENOUS | Status: DC

## 2022-05-21 MED ORDER — LACTATED RINGERS IV BOLUS (SEPSIS)
1000.0000 mL | Freq: Once | INTRAVENOUS | Status: AC
  Administered 2022-05-21: 1000 mL via INTRAVENOUS

## 2022-05-21 NOTE — ED Triage Notes (Signed)
Patient arrives by EMS after family called for alter mental status.  Patient a right foot partial amputation 2 months ago.  The stump of the foot is red and warm to touch.  Patient is on abx at home taking keflex.

## 2022-05-21 NOTE — Consult Note (Addendum)
PHARMACY -  BRIEF ANTIBIOTIC NOTE   Pharmacy has received consult(s) for vancomycin and aztreonam from an ED provider.  The patient's profile has been reviewed for ht/wt/allergies/indication/available labs.    One time order(s) placed for  Vancomycin 1000 mg Ceftriaxone 2 gram   Further antibiotics/pharmacy consults should be ordered by admitting physician if indicated.                       Thank you, Dorothe Pea, PharmD, BCPS Clinical Pharmacist   05/21/2022  11:52 PM

## 2022-05-21 NOTE — ED Provider Notes (Incomplete)
[facility] Provider Note    Event Date/Time   First MD Initiated Contact with Patient 05/21/22 2338     (approximate)   History   Altered Mental Status and Foot Pain   HPI Level 5 caveat:  history/ROS limited by acute/critical illness  [name] [name] is a 57 y.o. 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  She presents by EMS for altered mental status throughout the course of the day.  According to family she has not been acting herself and has been very "lethargic" with minimal energy.  When EMS arrived they noted her to be borderline tachycardic and febrile.  Her family reports that she is currently taking Keflex for a foot infection and that she has another antibiotic called to the pharmacy but she has not yet picked it up (they are not sure which 1).  The patient said that her right foot has been hurting more when she walks on it but nothing else hurts.  She denies shortness of breath.  She admits to feeling hot.  She has had no nausea, vomiting, nor diarrhea.  She denies abdominal pain and dysuria.  She has been having some discharge from one of the wounds on her right foot and she said that she goes to a "[facility]" but she cannot remember the specific name of the clinic or the doctor.     Physical Exam   Triage Vital Signs: ED Triage Vitals  Enc Vitals Group     BP 05/21/22 2340 116/70     Pulse Rate 05/21/22 2340 99     Resp 05/21/22 2340 (!) 23     Temp 05/21/22 2340 (!) 101.2 F (38.4 C)     Temp Source 05/21/22 2340 Oral     SpO2 05/21/22 2340 99 %     Weight 05/21/22 2344 107.4 kg (236 lb 12.4 oz)     Height 05/21/22 2344 1.626 m ('5\' 4"'$ )     Head Circumference --      Peak Flow --      Pain Score --      Pain Loc --      Pain Edu? --      Excl. in [place]? --     Most recent vital signs: Vitals:   05/21/22 2340 05/21/22 2346  BP:  116/70   Pulse: 99   Resp: (!) 23   Temp: (!) 101.2 F (38.4 C)   SpO2: 99% 99%     General: Awake, alert, no obvious distress. CV:  Good peripheral perfusion.  Mild/borderline tachycardia.  Normal heart sounds. Resp:  Normal effort. Speaking easily and comfortably, no accessory muscle usage nor intercostal retractions.  Lungs are clear to auscultation. Abd:  No distention.  Obese.  No tenderness to palpation. Other:  ***   ED Results / Procedures / Treatments   Labs (all labs ordered are listed, but only abnormal results are displayed) Labs Reviewed  RESP PANEL BY RT-PCR (RSV, FLU A&B, COVID)  RVPGX2  CULTURE, BLOOD (ROUTINE X 2)  CULTURE, BLOOD (ROUTINE X 2)  RESP PANEL BY RT-PCR (RSV, FLU A&B, COVID)  RVPGX2  LACTIC ACID, PLASMA  LACTIC ACID, PLASMA  COMPREHENSIVE METABOLIC PANEL  CBC WITH DIFFERENTIAL/PLATELET  PROTIME-INR  APTT  LIPASE, BLOOD  BLOOD GAS, VENOUS  URINALYSIS, W/ REFLEX TO CULTURE (INFECTION SUSPECTED)  PROCALCITONIN  TROPONIN I (HIGH SENSITIVITY)     EKG  ED ECG REPORT I, Hinda Kehr, the attending physician, personally viewed and interpreted this ECG.  Date: 05/22/2022 EKG Time: 00: 00 Rate: 97 Rhythm: normal sinus rhythm QRS Axis: normal Intervals: normal ST/T Wave abnormalities: Non-specific ST segment / T-wave changes, but no clear evidence of acute ischemia. Narrative Interpretation: no definitive evidence of acute ischemia; does not meet STEMI criteria.    RADIOLOGY ***    PROCEDURES:  Critical Care performed: Yes, see critical care procedure note(s)  .1-3 Lead EKG Interpretation  Performed by: Hinda Kehr, MD Authorized by: Hinda Kehr, MD     Interpretation: abnormal     ECG rate:  100   ECG rate assessment: tachycardic     Rhythm: sinus tachycardia     Ectopy: none     Conduction: normal   .Critical Care  Performed by: Hinda Kehr, MD Authorized by: Hinda Kehr, MD   Critical care provider statement:     Critical care time (minutes):  30   Critical care time was exclusive of:  Separately billable procedures and treating other patients   Critical care was necessary to treat or prevent imminent or life-threatening deterioration of the following conditions:  Sepsis   Critical care was time spent personally by me on the following activities:  Development of treatment plan with patient or surrogate, evaluation of patient's response to treatment, examination of patient, obtaining history from patient or surrogate, ordering and performing treatments and interventions, ordering and review of laboratory studies, ordering and review of radiographic studies, pulse oximetry, re-evaluation of patient's condition and review of old charts    MEDICATIONS ORDERED IN ED: Medications  lactated ringers bolus 1,000 mL (1,000 mLs Intravenous New Bag/Given 05/21/22 2348)  vancomycin (VANCOCIN) IVPB 1000 mg/200 mL premix (has no administration in time range)  cefTRIAXone (ROCEPHIN) 2 g in sodium chloride 0.9 % 100 mL IVPB (has no administration in time range)     IMPRESSION / MDM / [place] / ED COURSE  I reviewed the triage vital signs and the nursing notes.                              Differential diagnosis includes, but is not limited to, sepsis, cellulitis, abscess, osteomyelitis, UTI, pneumonia, electrolyte or metabolic abnormality.  Patient's presentation is most consistent with acute presentation with potential threat to life or bodily function.  Strongly suspect worsening infection of right foot.  It appears cellulitic with a chronic abscess that may be more acutely infected.  The patient is on the cardiac monitor to evaluate for evidence of arrhythmia and/or significant heart rate changes.   (Please see Hospital Course for additional medications that may have been given over the course of the ED stay:) Labs/studies ordered: I ordered standard sepsis labs including the following: COVID-19  PCR swab, blood cultures x2, pro time-INR, CMP, urinalysis, urine culture, lactic acid, APTT, CBC with differential, high-sensitivity troponin, lipase.  Also ordered procalcitonin and APTT Interventions/Medications given: LR 1 L IV bolus,       FINAL CLINICAL IMPRESSION(S) / ED DIAGNOSES   Final diagnoses:  None     Rx / DC Orders   ED Discharge Orders     None        Note:  This document was prepared using Dragon voice recognition software and may include unintentional dictation errors.

## 2022-05-21 NOTE — Consult Note (Signed)
CODE SEPSIS - PHARMACY COMMUNICATION  **[facility] Antibiotics should be administered within 1 hour of Sepsis diagnosis**  Time Code Sepsis Called/Page Received: 1140  Antibiotics Ordered: ceftriaxone, vancomycin  Time of 1st antibiotic administration: 0005  Additional action taken by pharmacy: n/a  If necessary, Name of Provider/Nurse Contacted: Ewing, PharmD, BCPS Clinical Pharmacist   05/21/2022  11:52 PM

## 2022-05-21 NOTE — ED Provider Notes (Addendum)
[facility] Provider Note    Event Date/Time   First MD Initiated Contact with Patient 05/21/22 2338     (approximate)   History   Altered Mental Status and Foot Pain   HPI Level 5 caveat:  history/ROS limited by acute/critical illness  [name] [name] is a 57 y.o. 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  She presents by EMS for altered mental status throughout the course of the day.  According to family she has not been acting herself and has been very "lethargic" with minimal energy.  When EMS arrived they noted her to be borderline tachycardic and febrile.  Her family reports that she is currently taking Keflex for a foot infection and that she has another antibiotic called to the pharmacy but she has not yet picked it up (they are not sure which 1).  The patient said that her right foot has been hurting more when she walks on it but nothing else hurts.  She denies shortness of breath.  She admits to feeling hot.  She has had no nausea, vomiting, nor diarrhea.  She denies abdominal pain and dysuria.  She has been having some discharge from one of the wounds on her right foot and she said that she goes to a [facility]" but she cannot remember the specific name of the clinic or the doctor.     Physical Exam   Triage Vital Signs: ED Triage Vitals  Enc Vitals Group     BP 05/21/22 2340 116/70     Pulse Rate 05/21/22 2340 99     Resp 05/21/22 2340 (!) 23     Temp 05/21/22 2340 (!) 101.2 F (38.4 C)     Temp Source 05/21/22 2340 Oral     SpO2 05/21/22 2340 99 %     Weight 05/21/22 2344 107.4 kg (236 lb 12.4 oz)     Height 05/21/22 2344 1.626 m ('5\' 4"'$ )     Head Circumference --      Peak Flow --      Pain Score --      Pain Loc --      Pain Edu? --      Excl. in [place]? --     Most recent vital signs: Vitals:   05/22/22 0225 05/22/22 0300  BP:   102/60  Pulse:  100  Resp:  (!) 34  Temp: 98.6 F (37 C)   SpO2:  96%     General: Awake, alert, no obvious distress. CV:  Good peripheral perfusion.  Mild/borderline tachycardia.  Normal heart sounds. Resp:  Normal effort. Speaking easily and comfortably, no accessory muscle usage nor intercostal retractions.  Lungs are clear to auscultation. Abd:  No distention.  Obese.  No tenderness to palpation. Other:  GCS 14 for confusion.   ED Results / Procedures / Treatments   Labs (all labs ordered are listed, but only abnormal results are displayed) Labs Reviewed  LACTIC ACID, PLASMA - Abnormal; Notable for the following components:      Result Value   Lactic Acid, Venous 2.2 (*)    All other components within normal limits  LACTIC ACID, PLASMA - Abnormal; Notable for the following components:   Lactic Acid, Venous 2.3 (*)    All other components within normal limits  COMPREHENSIVE METABOLIC PANEL - Abnormal; Notable for the following components:   Sodium 127 (*)  Chloride 95 (*)    Glucose, Bld 118 (*)    Albumin 3.4 (*)    All other components within normal limits  CBC WITH DIFFERENTIAL/PLATELET - Abnormal; Notable for the following components:   WBC 19.0 (*)    RBC 3.80 (*)    Neutro Abs 17.0 (*)    Abs Immature Granulocytes 0.33 (*)    All other components within normal limits  PROTIME-INR - Abnormal; Notable for the following components:   Prothrombin Time 15.3 (*)    All other components within normal limits  BLOOD GAS, VENOUS - Abnormal; Notable for the following components:   pO2, Ven <31 (*)    All other components within normal limits  CBC - Abnormal; Notable for the following components:   WBC 18.7 (*)    RBC 3.33 (*)    Hemoglobin 10.7 (*)    HCT 32.4 (*)    All other components within normal limits  RESP PANEL BY RT-PCR (RSV, FLU A&B, COVID)  RVPGX2  CULTURE, BLOOD (ROUTINE X 2)  CULTURE, BLOOD (ROUTINE X 2)  APTT  LIPASE, BLOOD  PROCALCITONIN   URINALYSIS, W/ REFLEX TO CULTURE (INFECTION SUSPECTED)  HIV ANTIBODY (ROUTINE TESTING W REFLEX)  COMPREHENSIVE METABOLIC PANEL  CBC  TROPONIN I (HIGH SENSITIVITY)  TROPONIN I (HIGH SENSITIVITY)     EKG  ED ECG REPORT I, Hinda Kehr, the attending physician, personally viewed and interpreted this ECG.  Date: 05/22/2022 EKG Time: 00: 00 Rate: 97 Rhythm: normal sinus rhythm QRS Axis: normal Intervals: normal ST/T Wave abnormalities: Non-specific ST segment / T-wave changes, but no clear evidence of acute ischemia. Narrative Interpretation: no definitive evidence of acute ischemia; does not meet STEMI criteria.    RADIOLOGY I viewed and interpreted the patient's chest x-ray as well as x-rays of her right foot.  I see no evidence of pneumonia nor osteomyelitis in the foot x-rays.  Radiology reports are reassuring as well.    PROCEDURES:  Critical Care performed: Yes, see critical care procedure note(s)  .1-3 Lead EKG Interpretation  Performed by: Hinda Kehr, MD Authorized by: Hinda Kehr, MD     Interpretation: abnormal     ECG rate:  100   ECG rate assessment: tachycardic     Rhythm: sinus tachycardia     Ectopy: none     Conduction: normal   .Critical Care  Performed by: Hinda Kehr, MD Authorized by: Hinda Kehr, MD   Critical care provider statement:    Critical care time (minutes):  30   Critical care time was exclusive of:  Separately billable procedures and treating other patients   Critical care was necessary to treat or prevent imminent or life-threatening deterioration of the following conditions:  Sepsis   Critical care was time spent personally by me on the following activities:  Development of treatment plan with patient or surrogate, evaluation of patient's response to treatment, examination of patient, obtaining history from patient or surrogate, ordering and performing treatments and interventions, ordering and review of laboratory studies,  ordering and review of radiographic studies, pulse oximetry, re-evaluation of patient's condition and review of old charts    MEDICATIONS ORDERED IN ED: Medications  cefTRIAXone (ROCEPHIN) 2 g in sodium chloride 0.9 % 100 mL IVPB (has no administration in time range)  busPIRone (BUSPAR) tablet 15 mg (has no administration in time range)  aspirin chewable tablet 81 mg (has no administration in time range)  albuterol (PROVENTIL) (2.5 MG/3ML) 0.083% nebulizer solution 3 mL (has no administration in  time range)  DULoxetine (CYMBALTA) DR capsule 20 mg (has no administration in time range)  escitalopram (LEXAPRO) tablet 10 mg (has no administration in time range)  estradiol (ESTRACE) tablet 1 mg (has no administration in time range)  haloperidol (HALDOL) tablet 5 mg (5 mg Oral Given 05/22/22 0221)  lamoTRIgine (LAMICTAL) tablet 100 mg (has no administration in time range)  topiramate (TOPAMAX) tablet 50 mg (50 mg Oral Given 05/22/22 0221)  prazosin (MINIPRESS) capsule 2 mg (2 mg Oral Given 05/22/22 0223)  sodium chloride flush (NS) 0.9 % injection 3 mL (3 mLs Intravenous Given 05/22/22 0209)  acetaminophen (TYLENOL) tablet 650 mg (has no administration in time range)    Or  acetaminophen (TYLENOL) suppository 650 mg (has no administration in time range)  HYDROcodone-acetaminophen (NORCO/VICODIN) 5-325 MG per tablet 1 tablet (has no administration in time range)  0.9 %  sodium chloride infusion ( Intravenous New Bag/Given 05/22/22 0220)  heparin injection 5,000 Units (5,000 Units Subcutaneous Given 05/22/22 0221)  vancomycin (VANCOREADY) IVPB 1500 mg/300 mL (1,500 mg Intravenous New Bag/Given 05/22/22 0249)  lactated ringers bolus 1,000 mL (0 mLs Intravenous Stopped 05/22/22 0110)  vancomycin (VANCOCIN) IVPB 1000 mg/200 mL premix (0 mg Intravenous Stopped 05/22/22 0110)  cefTRIAXone (ROCEPHIN) 2 g in sodium chloride 0.9 % 100 mL IVPB (0 g Intravenous Stopped 05/22/22 0110)  acetaminophen (TYLENOL) tablet 1,000 mg  (1,000 mg Oral Given 05/22/22 0116)  lactated ringers bolus 1,000 mL (0 mLs Intravenous Stopped 05/22/22 0225)     IMPRESSION / MDM / ASSESSMENT AND PLAN / ED COURSE  I reviewed the triage vital signs and the nursing notes.                              Differential diagnosis includes, but is not limited to, sepsis, cellulitis, abscess, osteomyelitis, UTI, pneumonia, electrolyte or metabolic abnormality.  Patient's presentation is most consistent with acute presentation with potential threat to life or bodily function.  Strongly suspect worsening infection of right foot.  It appears cellulitic with a chronic abscess that may be more acutely infected.  The patient is on the cardiac monitor to evaluate for evidence of arrhythmia and/or significant heart rate changes.   (Please see Hospital Course for additional medications that may have been given over the course of the ED stay:) Labs/studies ordered: I ordered standard sepsis labs including the following: COVID-19 PCR swab, blood cultures x2, pro time-INR, CMP, urinalysis, urine culture, lactic acid, APTT, CBC with differential, high-sensitivity troponin, lipase.  Also ordered procalcitonin and APTT Interventions/Medications given: LR 1 L IV bolus, vancomycin 1 g IV, aztreonam 2 g IV (changed by pharmacy to ceftriaxone 2 g IV)  Labs and imaging are pending.  Anticipate admission given altered mental status with obvious criteria for sepsis met (tachycardia, tachypnea, fever, with probable source of right foot.   Clinical Course as of 05/22/22 0414  Sat May 22, 2022  0013 WBC(!): 19.0 [CF]  0038 Lactic Acid, Venous(!!): 2.2 [CF]  0038 DG Chest [facility] I viewed and interpreted the patient's 1 view chest x-ray.  No evidence of pneumonia.  I also read the radiologist's report, which confirmed no acute findings. [CF]  0039 DG Foot Complete Right I viewed and interpreted the patient's foot x-rays.  No obvious sign of osteomyelitis but the  images are somewhat limited.  Radiologist confirmed and recommended MRI if there is clinical concern, but that does not need to be done on  an emergent basis. [CF]  0048 Resp panel by RT-PCR (RSV, Flu A&B, Covid) Anterior Nasal Swab [CF]  0048 Evaluation suggestive of severe sepsis but not septic shock.  I am ordering acetaminophen 1000 mg p.o. and additional IV fluids to meet 30 mL/kg goal based on ideal body weight.  Completed sepsis reassessment.  Patient remains generally well-appearing and without complaint.  She knows she will be staying in the hospital for additional antibiotics.  No urine specimen yet but  she is being treated broadly and should cover UTI if in fact it is present.  [CF]  0114 Procalcitonin: 0.46 [CF]  0157 (delayed documentation) consulted Dr. Posey Pronto for admission and we discussed the case.  She will admit the patient for further management. [CF]    Clinical Course User Index [CF] Hinda Kehr, MD     FINAL CLINICAL IMPRESSION(S) / ED DIAGNOSES   Final diagnoses:  Severe sepsis ([place])  Cellulitis of right foot  Delirium  Hyponatremia     Rx / DC Orders   ED Discharge Orders     None        Note:  This document was prepared using Dragon voice recognition software and may include unintentional dictation errors.   Hinda Kehr, MD 05/22/22 [id number]    Hinda Kehr, MD 05/22/22 [phone]

## 2022-05-22 ENCOUNTER — Inpatient Hospital Stay: Payer: 59 | Admitting: Certified Registered"

## 2022-05-22 ENCOUNTER — Emergency Department: Payer: 59

## 2022-05-22 ENCOUNTER — Other Ambulatory Visit: Payer: Self-pay

## 2022-05-22 ENCOUNTER — Other Ambulatory Visit: Payer: Medicaid Other

## 2022-05-22 ENCOUNTER — Inpatient Hospital Stay: Payer: 59

## 2022-05-22 ENCOUNTER — Encounter: Payer: Self-pay | Admitting: Student in an Organized Health Care Education/Training Program

## 2022-05-22 ENCOUNTER — Encounter
Admission: EM | Disposition: A | Discharge status: Home Health | Payer: Self-pay | Source: Home / Self Care | Attending: Osteopathic Medicine

## 2022-05-22 DIAGNOSIS — K219 Gastro-esophageal reflux disease without esophagitis: Secondary | ICD-10-CM | POA: Diagnosis not present

## 2022-05-22 DIAGNOSIS — G928 Other toxic encephalopathy: Secondary | ICD-10-CM | POA: Diagnosis present

## 2022-05-22 DIAGNOSIS — E1143 Type 2 diabetes mellitus with diabetic autonomic (poly)neuropathy: Secondary | ICD-10-CM | POA: Diagnosis present

## 2022-05-22 DIAGNOSIS — J45909 Unspecified asthma, uncomplicated: Secondary | ICD-10-CM | POA: Diagnosis not present

## 2022-05-22 DIAGNOSIS — M961 Postlaminectomy syndrome, not elsewhere classified: Secondary | ICD-10-CM | POA: Diagnosis not present

## 2022-05-22 DIAGNOSIS — R4182 Altered mental status, unspecified: Secondary | ICD-10-CM

## 2022-05-22 DIAGNOSIS — L03115 Cellulitis of right lower limb: Secondary | ICD-10-CM | POA: Diagnosis present

## 2022-05-22 DIAGNOSIS — R600 Localized edema: Secondary | ICD-10-CM | POA: Diagnosis not present

## 2022-05-22 DIAGNOSIS — G4730 Sleep apnea, unspecified: Secondary | ICD-10-CM | POA: Diagnosis not present

## 2022-05-22 DIAGNOSIS — M25561 Pain in right knee: Secondary | ICD-10-CM | POA: Diagnosis not present

## 2022-05-22 DIAGNOSIS — L02611 Cutaneous abscess of right foot: Secondary | ICD-10-CM

## 2022-05-22 DIAGNOSIS — M869 Osteomyelitis, unspecified: Secondary | ICD-10-CM | POA: Diagnosis present

## 2022-05-22 DIAGNOSIS — G40909 Epilepsy, unspecified, not intractable, without status epilepticus: Secondary | ICD-10-CM | POA: Diagnosis present

## 2022-05-22 DIAGNOSIS — Z481 Encounter for planned postprocedural wound closure: Secondary | ICD-10-CM | POA: Diagnosis not present

## 2022-05-22 DIAGNOSIS — L03031 Cellulitis of right toe: Secondary | ICD-10-CM | POA: Diagnosis not present

## 2022-05-22 DIAGNOSIS — S91301A Unspecified open wound, right foot, initial encounter: Secondary | ICD-10-CM | POA: Diagnosis not present

## 2022-05-22 DIAGNOSIS — Z9889 Other specified postprocedural states: Secondary | ICD-10-CM | POA: Diagnosis not present

## 2022-05-22 DIAGNOSIS — T8743 Infection of amputation stump, right lower extremity: Secondary | ICD-10-CM | POA: Diagnosis present

## 2022-05-22 DIAGNOSIS — R410 Disorientation, unspecified: Secondary | ICD-10-CM | POA: Diagnosis not present

## 2022-05-22 DIAGNOSIS — N390 Urinary tract infection, site not specified: Secondary | ICD-10-CM | POA: Diagnosis present

## 2022-05-22 DIAGNOSIS — F319 Bipolar disorder, unspecified: Secondary | ICD-10-CM | POA: Diagnosis present

## 2022-05-22 DIAGNOSIS — F32A Depression, unspecified: Secondary | ICD-10-CM | POA: Diagnosis not present

## 2022-05-22 DIAGNOSIS — E871 Hypo-osmolality and hyponatremia: Secondary | ICD-10-CM | POA: Diagnosis present

## 2022-05-22 DIAGNOSIS — R109 Unspecified abdominal pain: Secondary | ICD-10-CM | POA: Diagnosis not present

## 2022-05-22 DIAGNOSIS — R6521 Severe sepsis with septic shock: Secondary | ICD-10-CM | POA: Diagnosis present

## 2022-05-22 DIAGNOSIS — R6520 Severe sepsis without septic shock: Secondary | ICD-10-CM | POA: Diagnosis not present

## 2022-05-22 DIAGNOSIS — N179 Acute kidney failure, unspecified: Secondary | ICD-10-CM | POA: Diagnosis present

## 2022-05-22 DIAGNOSIS — Z6841 Body Mass Index (BMI) 40.0 and over, adult: Secondary | ICD-10-CM | POA: Diagnosis not present

## 2022-05-22 DIAGNOSIS — A419 Sepsis, unspecified organism: Secondary | ICD-10-CM

## 2022-05-22 DIAGNOSIS — E8721 Acute metabolic acidosis: Secondary | ICD-10-CM | POA: Diagnosis not present

## 2022-05-22 DIAGNOSIS — S98911A Complete traumatic amputation of right foot, level unspecified, initial encounter: Secondary | ICD-10-CM | POA: Diagnosis not present

## 2022-05-22 DIAGNOSIS — E11621 Type 2 diabetes mellitus with foot ulcer: Secondary | ICD-10-CM | POA: Diagnosis present

## 2022-05-22 DIAGNOSIS — F603 Borderline personality disorder: Secondary | ICD-10-CM | POA: Diagnosis present

## 2022-05-22 DIAGNOSIS — E1169 Type 2 diabetes mellitus with other specified complication: Secondary | ICD-10-CM | POA: Diagnosis present

## 2022-05-22 DIAGNOSIS — Y835 Amputation of limb(s) as the cause of abnormal reaction of the patient, or of later complication, without mention of misadventure at the time of the procedure: Secondary | ICD-10-CM | POA: Diagnosis present

## 2022-05-22 DIAGNOSIS — R531 Weakness: Secondary | ICD-10-CM | POA: Diagnosis not present

## 2022-05-22 DIAGNOSIS — M25511 Pain in right shoulder: Secondary | ICD-10-CM | POA: Diagnosis not present

## 2022-05-22 DIAGNOSIS — L089 Local infection of the skin and subcutaneous tissue, unspecified: Secondary | ICD-10-CM | POA: Diagnosis not present

## 2022-05-22 DIAGNOSIS — M545 Low back pain, unspecified: Secondary | ICD-10-CM | POA: Diagnosis not present

## 2022-05-22 DIAGNOSIS — Z1152 Encounter for screening for COVID-19: Secondary | ICD-10-CM | POA: Diagnosis not present

## 2022-05-22 DIAGNOSIS — G4733 Obstructive sleep apnea (adult) (pediatric): Secondary | ICD-10-CM | POA: Diagnosis not present

## 2022-05-22 DIAGNOSIS — R29818 Other symptoms and signs involving the nervous system: Secondary | ICD-10-CM | POA: Diagnosis not present

## 2022-05-22 DIAGNOSIS — R2689 Other abnormalities of gait and mobility: Secondary | ICD-10-CM | POA: Diagnosis not present

## 2022-05-22 DIAGNOSIS — J4520 Mild intermittent asthma, uncomplicated: Secondary | ICD-10-CM | POA: Diagnosis present

## 2022-05-22 DIAGNOSIS — J90 Pleural effusion, not elsewhere classified: Secondary | ICD-10-CM | POA: Diagnosis present

## 2022-05-22 DIAGNOSIS — E875 Hyperkalemia: Secondary | ICD-10-CM | POA: Diagnosis not present

## 2022-05-22 DIAGNOSIS — K3184 Gastroparesis: Secondary | ICD-10-CM | POA: Diagnosis present

## 2022-05-22 DIAGNOSIS — I10 Essential (primary) hypertension: Secondary | ICD-10-CM | POA: Diagnosis present

## 2022-05-22 DIAGNOSIS — R569 Unspecified convulsions: Secondary | ICD-10-CM | POA: Diagnosis not present

## 2022-05-22 DIAGNOSIS — G9341 Metabolic encephalopathy: Secondary | ICD-10-CM | POA: Diagnosis not present

## 2022-05-22 DIAGNOSIS — L97519 Non-pressure chronic ulcer of other part of right foot with unspecified severity: Secondary | ICD-10-CM | POA: Diagnosis present

## 2022-05-22 DIAGNOSIS — M25552 Pain in left hip: Secondary | ICD-10-CM | POA: Diagnosis not present

## 2022-05-22 HISTORY — PX: I & D EXTREMITY: SHX5045

## 2022-05-22 HISTORY — DX: Altered mental status, unspecified: R41.82

## 2022-05-22 HISTORY — DX: Sepsis, unspecified organism: A41.9

## 2022-05-22 LAB — COMPREHENSIVE METABOLIC PANEL
ALT: 10 U/L (ref 0–44)
ALT: 12 U/L (ref 0–44)
AST: 17 U/L (ref 15–41)
AST: 18 U/L (ref 15–41)
Albumin: 2.7 g/dL — ABNORMAL LOW (ref 3.5–5.0)
Albumin: 3.4 g/dL — ABNORMAL LOW (ref 3.5–5.0)
Alkaline Phosphatase: 71 U/L (ref 38–126)
Alkaline Phosphatase: 85 U/L (ref 38–126)
Anion gap: 7 (ref 5–15)
Anion gap: 8 (ref 5–15)
BUN: 10 mg/dL (ref 6–20)
BUN: 11 mg/dL (ref 6–20)
CO2: 24 mmol/L (ref 22–32)
CO2: 25 mmol/L (ref 22–32)
Calcium: 8.3 mg/dL — ABNORMAL LOW (ref 8.9–10.3)
Calcium: 8.9 mg/dL (ref 8.9–10.3)
Chloride: 95 mmol/L — ABNORMAL LOW (ref 98–111)
Chloride: 99 mmol/L (ref 98–111)
Creatinine, Ser: 0.79 mg/dL (ref 0.44–1.00)
Creatinine, Ser: 0.81 mg/dL (ref 0.44–1.00)
GFR, Estimated: >60 mL/min (ref >60)
GFR, Estimated: >60 mL/min (ref >60)
Glucose, Bld: 101 mg/dL — ABNORMAL HIGH (ref 70–99)
Glucose, Bld: 118 mg/dL — ABNORMAL HIGH (ref 70–99)
Potassium: 3.9 mmol/L (ref 3.5–5.1)
Potassium: 4 mmol/L (ref 3.5–5.1)
Sodium: 127 mmol/L — ABNORMAL LOW (ref 135–145)
Sodium: 131 mmol/L — ABNORMAL LOW (ref 135–145)
Total Bilirubin: 0.7 mg/dL (ref 0.3–1.2)
Total Bilirubin: 0.8 mg/dL (ref 0.3–1.2)
Total Protein: 5.8 g/dL — ABNORMAL LOW (ref 6.5–8.1)
Total Protein: 7.5 g/dL (ref 6.5–8.1)

## 2022-05-22 LAB — LACTIC ACID, PLASMA
Lactic Acid, Venous: 1.2 mmol/L (ref 0.5–1.9)
Lactic Acid, Venous: 2.2 mmol/L (ref 0.5–1.9)
Lactic Acid, Venous: 2.3 mmol/L (ref 0.5–1.9)

## 2022-05-22 LAB — URINALYSIS, W/ REFLEX TO CULTURE (INFECTION SUSPECTED)
Bilirubin Urine: NEGATIVE
Glucose, UA: NEGATIVE mg/dL
Hgb urine dipstick: NEGATIVE
Ketones, ur: NEGATIVE mg/dL
Leukocytes,Ua: NEGATIVE
Nitrite: NEGATIVE
Protein, ur: NEGATIVE mg/dL
Specific Gravity, Urine: 1.005 (ref 1.005–1.030)
pH: 6 (ref 5.0–8.0)

## 2022-05-22 LAB — CBC WITH DIFFERENTIAL/PLATELET
Abs Immature Granulocytes: 0.33 10*3/uL — ABNORMAL HIGH (ref 0.00–0.07)
Basophils Absolute: 0 10*3/uL (ref 0.0–0.1)
Basophils Relative: 0 %
Eosinophils Absolute: 0 10*3/uL (ref 0.0–0.5)
Eosinophils Relative: 0 %
HCT: 37 % (ref 36.0–46.0)
Hemoglobin: 12 g/dL (ref 12.0–15.0)
Immature Granulocytes: 2 %
Lymphocytes Relative: 5 %
Lymphs Abs: 0.9 10*3/uL (ref 0.7–4.0)
MCH: 31.6 pg (ref 26.0–34.0)
MCHC: 32.4 g/dL (ref 30.0–36.0)
MCV: 97.4 fL (ref 80.0–100.0)
Monocytes Absolute: 0.7 10*3/uL (ref 0.1–1.0)
Monocytes Relative: 4 %
Neutro Abs: 17 10*3/uL — ABNORMAL HIGH (ref 1.7–7.7)
Neutrophils Relative %: 89 %
Platelets: 239 10*3/uL (ref 150–400)
RBC: 3.8 MIL/uL — ABNORMAL LOW (ref 3.87–5.11)
RDW: 14 % (ref 11.5–15.5)
WBC: 19 10*3/uL — ABNORMAL HIGH (ref 4.0–10.5)
nRBC: 0 % (ref 0.0–0.2)

## 2022-05-22 LAB — RESP PANEL BY RT-PCR (RSV, FLU A&B, COVID)  RVPGX2
Influenza A by PCR: NEGATIVE
Influenza B by PCR: NEGATIVE
Resp Syncytial Virus by PCR: NEGATIVE
SARS Coronavirus 2 by RT PCR: NEGATIVE

## 2022-05-22 LAB — CBC
HCT: 32.4 % — ABNORMAL LOW (ref 36.0–46.0)
HCT: 32.1 % — ABNORMAL LOW (ref 36.0–46.0)
Hemoglobin: 10.7 g/dL — ABNORMAL LOW (ref 12.0–15.0)
Hemoglobin: 10.5 g/dL — ABNORMAL LOW (ref 12.0–15.0)
MCH: 32.1 pg (ref 26.0–34.0)
MCH: 31.7 pg (ref 26.0–34.0)
MCHC: 33 g/dL (ref 30.0–36.0)
MCHC: 32.7 g/dL (ref 30.0–36.0)
MCV: 97.3 fL (ref 80.0–100.0)
MCV: 97 fL (ref 80.0–100.0)
Platelets: 201 10*3/uL (ref 150–400)
Platelets: 212 10*3/uL (ref 150–400)
RBC: 3.33 MIL/uL — ABNORMAL LOW (ref 3.87–5.11)
RBC: 3.31 MIL/uL — ABNORMAL LOW (ref 3.87–5.11)
RDW: 13.8 % (ref 11.5–15.5)
RDW: 13.9 % (ref 11.5–15.5)
WBC: 18.7 10*3/uL — ABNORMAL HIGH (ref 4.0–10.5)
WBC: 20.3 10*3/uL — ABNORMAL HIGH (ref 4.0–10.5)
nRBC: 0 % (ref 0.0–0.2)
nRBC: 0 % (ref 0.0–0.2)

## 2022-05-22 LAB — GLUCOSE, CAPILLARY
Glucose-Capillary: 122 mg/dL — ABNORMAL HIGH (ref 70–99)
Glucose-Capillary: 118 mg/dL — ABNORMAL HIGH (ref 70–99)
Glucose-Capillary: 133 mg/dL — ABNORMAL HIGH (ref 70–99)
Glucose-Capillary: 150 mg/dL — ABNORMAL HIGH (ref 70–99)

## 2022-05-22 LAB — URINE DRUG SCREEN, QUALITATIVE (ARMC ONLY)
Amphetamines, Ur Screen: NOT DETECTED
Barbiturates, Ur Screen: NOT DETECTED
Benzodiazepine, Ur Scrn: NOT DETECTED
Cannabinoid 50 Ng, Ur ~~LOC~~: NOT DETECTED
Cocaine Metabolite,Ur ~~LOC~~: NOT DETECTED
MDMA (Ecstasy)Ur Screen: NOT DETECTED
Methadone Scn, Ur: NOT DETECTED
Opiate, Ur Screen: NOT DETECTED
Phencyclidine (PCP) Ur S: NOT DETECTED
Tricyclic, Ur Screen: POSITIVE — AB

## 2022-05-22 LAB — BLOOD GAS, VENOUS
Acid-Base Excess: 1 mmol/L (ref 0.0–2.0)
Acid-base deficit: 1.9 mmol/L (ref 0.0–2.0)
Bicarbonate: 26.6 mmol/L (ref 20.0–28.0)
Bicarbonate: 22.2 mmol/L (ref 20.0–28.0)
O2 Saturation: 43.6 %
O2 Saturation: 77 %
Patient temperature: 37
Patient temperature: 37
pCO2, Ven: 45 mmHg (ref 44–60)
pCO2, Ven: 35 mmHg — ABNORMAL LOW (ref 44–60)
pH, Ven: 7.38 (ref 7.25–7.43)
pH, Ven: 7.41 (ref 7.25–7.43)
pO2, Ven: <31 mmHg — CL (ref 32–45)
pO2, Ven: 45 mmHg (ref 32–45)

## 2022-05-22 LAB — TROPONIN I (HIGH SENSITIVITY)
Troponin I (High Sensitivity): 4 ng/L (ref <18)
Troponin I (High Sensitivity): 5 ng/L (ref <18)

## 2022-05-22 LAB — MRSA NEXT GEN BY PCR, NASAL: MRSA by PCR Next Gen: NOT DETECTED

## 2022-05-22 LAB — PROTIME-INR
INR: 1.2 (ref 0.8–1.2)
Prothrombin Time: 15.3 seconds — ABNORMAL HIGH (ref 11.4–15.2)

## 2022-05-22 LAB — PROCALCITONIN: Procalcitonin: 0.46 ng/mL

## 2022-05-22 LAB — APTT: aPTT: 34 seconds (ref 24–36)

## 2022-05-22 LAB — HIV ANTIBODY (ROUTINE TESTING W REFLEX): HIV Screen 4th Generation wRfx: NONREACTIVE

## 2022-05-22 LAB — LIPASE, BLOOD: Lipase: 25 U/L (ref 11–51)

## 2022-05-22 SURGERY — IRRIGATION AND DEBRIDEMENT EXTREMITY
Anesthesia: General | Laterality: Right

## 2022-05-22 MED ORDER — ALBUTEROL SULFATE (2.5 MG/3ML) 0.083% IN NEBU
3.0000 mL | INHALATION_SOLUTION | RESPIRATORY_TRACT | Status: DC | PRN
  Administered 2022-06-13: 3 mL via RESPIRATORY_TRACT
  Filled 2022-05-22: qty 3

## 2022-05-22 MED ORDER — ONDANSETRON HCL 4 MG/2ML IJ SOLN: 4.0000 mg | Freq: Once | INTRAMUSCULAR | Status: DC | PRN

## 2022-05-22 MED ORDER — HALOPERIDOL 5 MG PO TABS
5.0000 mg | ORAL_TABLET | Freq: Every day | ORAL | Status: DC
  Administered 2022-05-22: 5 mg via ORAL
  Filled 2022-05-22: qty 1

## 2022-05-22 MED ORDER — ONDANSETRON HCL 4 MG/2ML IJ SOLN
4.0000 mg | Freq: Four times a day (QID) | INTRAMUSCULAR | Status: DC | PRN
  Administered 2022-05-22 – 2022-06-07 (×7): 4 mg via INTRAVENOUS
  Filled 2022-05-22 (×7): qty 2

## 2022-05-22 MED ORDER — SODIUM CHLORIDE 0.9 % IV SOLN
1.0000 g | Freq: Three times a day (TID) | INTRAVENOUS | Status: DC
  Administered 2022-05-22 – 2022-05-24 (×6): 1 g via INTRAVENOUS
  Filled 2022-05-22: qty 1
  Filled 2022-05-22 (×2): qty 20
  Filled 2022-05-22: qty 1
  Filled 2022-05-22 (×2): qty 20
  Filled 2022-05-22 (×2): qty 1

## 2022-05-22 MED ORDER — KETAMINE HCL 50 MG/ML IJ SOLN
INTRAMUSCULAR | Status: DC | PRN
  Administered 2022-05-22: 30 mg via INTRAVENOUS
  Administered 2022-05-22: 20 mg via INTRAVENOUS

## 2022-05-22 MED ORDER — TOPIRAMATE 25 MG PO TABS
50.0000 mg | ORAL_TABLET | Freq: Two times a day (BID) | ORAL | Status: DC
  Administered 2022-05-22 – 2022-06-15 (×48): 50 mg via ORAL
  Filled 2022-05-22 (×50): qty 2

## 2022-05-22 MED ORDER — ACETAMINOPHEN 325 MG PO TABS
650.0000 mg | ORAL_TABLET | Freq: Four times a day (QID) | ORAL | Status: DC | PRN
  Administered 2022-05-22 – 2022-06-11 (×6): 650 mg via ORAL
  Filled 2022-05-22 (×7): qty 2

## 2022-05-22 MED ORDER — SEVOFLURANE IN SOLN
RESPIRATORY_TRACT | Status: AC
  Filled 2022-05-22: qty 250

## 2022-05-22 MED ORDER — GLYCOPYRROLATE 0.2 MG/ML IJ SOLN
INTRAMUSCULAR | Status: AC
  Filled 2022-05-22: qty 1

## 2022-05-22 MED ORDER — ACETAMINOPHEN 500 MG PO TABS
1000.0000 mg | ORAL_TABLET | Freq: Once | ORAL | Status: AC
  Administered 2022-05-22: 1000 mg via ORAL
  Filled 2022-05-22: qty 2

## 2022-05-22 MED ORDER — PRAZOSIN HCL 2 MG PO CAPS
2.0000 mg | ORAL_CAPSULE | Freq: Every day | ORAL | Status: DC
  Administered 2022-05-22: 2 mg via ORAL
  Filled 2022-05-22: qty 1

## 2022-05-22 MED ORDER — INSULIN ASPART 100 UNIT/ML IJ SOLN
1.0000 [IU] | INTRAMUSCULAR | Status: DC
  Administered 2022-05-22 – 2022-05-23 (×2): 1 [IU] via SUBCUTANEOUS
  Administered 2022-05-23: 2 [IU] via SUBCUTANEOUS
  Administered 2022-05-23: 1 [IU] via SUBCUTANEOUS
  Administered 2022-05-23 (×2): 2 [IU] via SUBCUTANEOUS
  Administered 2022-05-23: 1 [IU] via SUBCUTANEOUS
  Filled 2022-05-22 (×7): qty 1

## 2022-05-22 MED ORDER — ONDANSETRON HCL 4 MG/2ML IJ SOLN
INTRAMUSCULAR | Status: AC
  Filled 2022-05-22: qty 2

## 2022-05-22 MED ORDER — CLINDAMYCIN PHOSPHATE 600 MG/50ML IV SOLN
600.0000 mg | Freq: Three times a day (TID) | INTRAVENOUS | Status: DC
  Administered 2022-05-22 – 2022-05-23 (×3): 600 mg via INTRAVENOUS
  Filled 2022-05-22 (×5): qty 50

## 2022-05-22 MED ORDER — MIDAZOLAM HCL 2 MG/2ML IJ SOLN
INTRAMUSCULAR | Status: AC
  Filled 2022-05-22: qty 2

## 2022-05-22 MED ORDER — PROPOFOL 10 MG/ML IV BOLUS
INTRAVENOUS | Status: DC | PRN
  Administered 2022-05-22: 120 mg via INTRAVENOUS
  Administered 2022-05-22: 50 mg via INTRAVENOUS

## 2022-05-22 MED ORDER — LACTATED RINGERS IV BOLUS (SEPSIS)
1000.0000 mL | Freq: Once | INTRAVENOUS | Status: AC
  Administered 2022-05-22: 1000 mL via INTRAVENOUS

## 2022-05-22 MED ORDER — ACETAMINOPHEN 650 MG RE SUPP: 650.0000 mg | Freq: Four times a day (QID) | RECTAL | Status: DC | PRN

## 2022-05-22 MED ORDER — PHENYLEPHRINE HCL (PRESSORS) 10 MG/ML IV SOLN
INTRAVENOUS | Status: DC | PRN
  Administered 2022-05-22 (×2): 160 ug via INTRAVENOUS

## 2022-05-22 MED ORDER — SODIUM CHLORIDE 0.9 % IV SOLN
250.0000 mL | INTRAVENOUS | Status: DC
  Administered 2022-05-22: 250 mL via INTRAVENOUS

## 2022-05-22 MED ORDER — FENTANYL CITRATE (PF) 100 MCG/2ML IJ SOLN
INTRAMUSCULAR | Status: DC | PRN
  Administered 2022-05-22 (×2): 50 ug via INTRAVENOUS

## 2022-05-22 MED ORDER — HEPARIN SODIUM (PORCINE) 5000 UNIT/ML IJ SOLN
5000.0000 [IU] | Freq: Three times a day (TID) | INTRAMUSCULAR | Status: DC
  Administered 2022-05-22 – 2022-05-23 (×5): 5000 [IU] via SUBCUTANEOUS
  Filled 2022-05-22 (×5): qty 1

## 2022-05-22 MED ORDER — SODIUM CHLORIDE 0.9 % IV SOLN: 2.0000 g | INTRAVENOUS | Status: DC

## 2022-05-22 MED ORDER — SODIUM CHLORIDE 0.9% FLUSH
3.0000 mL | Freq: Two times a day (BID) | INTRAVENOUS | Status: DC
  Administered 2022-05-22 – 2022-06-14 (×41): 3 mL via INTRAVENOUS

## 2022-05-22 MED ORDER — BUSPIRONE HCL 10 MG PO TABS
15.0000 mg | ORAL_TABLET | Freq: Three times a day (TID) | ORAL | Status: DC
  Administered 2022-05-22 – 2022-06-15 (×69): 15 mg via ORAL
  Filled 2022-05-22 (×14): qty 2
  Filled 2022-05-22: qty 3
  Filled 2022-05-22 (×55): qty 2

## 2022-05-22 MED ORDER — SODIUM CHLORIDE 0.9 % IV SOLN
INTRAVENOUS | Status: DC
  Administered 2022-05-22: 250 mL via INTRAVENOUS

## 2022-05-22 MED ORDER — DULOXETINE HCL 20 MG PO CPEP
20.0000 mg | ORAL_CAPSULE | Freq: Every day | ORAL | Status: DC
  Administered 2022-05-22 – 2022-06-07 (×16): 20 mg via ORAL
  Filled 2022-05-22 (×17): qty 1

## 2022-05-22 MED ORDER — LAMOTRIGINE 25 MG PO TABS
100.0000 mg | ORAL_TABLET | Freq: Every day | ORAL | Status: DC
  Administered 2022-05-22 – 2022-06-15 (×24): 100 mg via ORAL
  Filled 2022-05-22 (×11): qty 4
  Filled 2022-05-22: qty 1
  Filled 2022-05-22 (×13): qty 4

## 2022-05-22 MED ORDER — VANCOMYCIN HCL 1500 MG/300ML IV SOLN
1500.0000 mg | INTRAVENOUS | Status: DC
  Administered 2022-05-22 – 2022-05-24 (×3): 1500 mg via INTRAVENOUS
  Filled 2022-05-22 (×3): qty 300

## 2022-05-22 MED ORDER — KETAMINE HCL 50 MG/5ML IJ SOSY
PREFILLED_SYRINGE | INTRAMUSCULAR | Status: AC
  Filled 2022-05-22: qty 5

## 2022-05-22 MED ORDER — HYDROCODONE-ACETAMINOPHEN 5-325 MG PO TABS
1.0000 | ORAL_TABLET | ORAL | Status: DC | PRN
  Administered 2022-05-22 – 2022-05-23 (×3): 1 via ORAL
  Filled 2022-05-22 (×3): qty 1

## 2022-05-22 MED ORDER — LIDOCAINE HCL (CARDIAC) PF 100 MG/5ML IV SOSY
PREFILLED_SYRINGE | INTRAVENOUS | Status: DC | PRN
  Administered 2022-05-22: 50 mg via INTRAVENOUS

## 2022-05-22 MED ORDER — MIDAZOLAM HCL 2 MG/2ML IJ SOLN
INTRAMUSCULAR | Status: DC | PRN
  Administered 2022-05-22: 2 mg via INTRAVENOUS

## 2022-05-22 MED ORDER — PROPOFOL 10 MG/ML IV BOLUS
INTRAVENOUS | Status: AC
  Filled 2022-05-22: qty 20

## 2022-05-22 MED ORDER — SUCCINYLCHOLINE CHLORIDE 200 MG/10ML IV SOSY
PREFILLED_SYRINGE | INTRAVENOUS | Status: AC
  Filled 2022-05-22: qty 10

## 2022-05-22 MED ORDER — PHENYLEPHRINE 80 MCG/ML (10ML) SYRINGE FOR IV PUSH (FOR BLOOD PRESSURE SUPPORT)
PREFILLED_SYRINGE | INTRAVENOUS | Status: AC
  Filled 2022-05-22: qty 10

## 2022-05-22 MED ORDER — LACTATED RINGERS IV BOLUS
500.0000 mL | Freq: Once | INTRAVENOUS | Status: AC
  Administered 2022-05-22: 500 mL via INTRAVENOUS

## 2022-05-22 MED ORDER — ESTRADIOL 0.5 MG PO TABS
1.0000 mg | ORAL_TABLET | Freq: Every day | ORAL | Status: DC
  Administered 2022-05-22 – 2022-06-14 (×24): 1 mg via ORAL
  Filled 2022-05-22 (×25): qty 2

## 2022-05-22 MED ORDER — ASPIRIN 81 MG PO CHEW
81.0000 mg | CHEWABLE_TABLET | Freq: Two times a day (BID) | ORAL | Status: DC
  Administered 2022-05-22 – 2022-06-02 (×21): 81 mg via ORAL
  Filled 2022-05-22 (×22): qty 1

## 2022-05-22 MED ORDER — NOREPINEPHRINE 4 MG/250ML-% IV SOLN
2.0000 ug/min | INTRAVENOUS | Status: DC
  Administered 2022-05-22: 2 ug/min via INTRAVENOUS
  Filled 2022-05-22: qty 250

## 2022-05-22 MED ORDER — ONDANSETRON HCL 4 MG/2ML IJ SOLN
INTRAMUSCULAR | Status: DC | PRN
  Administered 2022-05-22: 4 mg via INTRAVENOUS

## 2022-05-22 MED ORDER — LIDOCAINE HCL (PF) 2 % IJ SOLN
INTRAMUSCULAR | Status: AC
  Filled 2022-05-22: qty 5

## 2022-05-22 MED ORDER — DEXAMETHASONE SODIUM PHOSPHATE 10 MG/ML IJ SOLN
INTRAMUSCULAR | Status: DC | PRN
  Administered 2022-05-22: 10 mg via INTRAVENOUS

## 2022-05-22 MED ORDER — DEXAMETHASONE SODIUM PHOSPHATE 10 MG/ML IJ SOLN
INTRAMUSCULAR | Status: AC
  Filled 2022-05-22: qty 1

## 2022-05-22 MED ORDER — GLYCOPYRROLATE 0.2 MG/ML IJ SOLN
INTRAMUSCULAR | Status: DC | PRN
  Administered 2022-05-22: .2 mg via INTRAVENOUS

## 2022-05-22 MED ORDER — SODIUM CHLORIDE 0.9 % IR SOLN
Status: DC | PRN
  Administered 2022-05-22: 3000 mL

## 2022-05-22 MED ORDER — ESCITALOPRAM OXALATE 10 MG PO TABS
10.0000 mg | ORAL_TABLET | Freq: Every day | ORAL | Status: DC
  Administered 2022-05-22 – 2022-06-15 (×24): 10 mg via ORAL
  Filled 2022-05-22 (×25): qty 1

## 2022-05-22 MED ORDER — IOHEXOL 300 MG/ML  SOLN
100.0000 mL | Freq: Once | INTRAMUSCULAR | Status: AC | PRN
  Administered 2022-05-22: 100 mL via INTRAVENOUS

## 2022-05-22 MED ORDER — FENTANYL CITRATE (PF) 100 MCG/2ML IJ SOLN: 25.0000 ug | INTRAMUSCULAR | Status: DC | PRN

## 2022-05-22 MED ORDER — ORAL CARE MOUTH RINSE: 15.0000 mL | OROMUCOSAL | Status: DC | PRN

## 2022-05-22 MED ORDER — SUCCINYLCHOLINE CHLORIDE 200 MG/10ML IV SOSY
PREFILLED_SYRINGE | INTRAVENOUS | Status: DC | PRN
  Administered 2022-05-22: 100 mg via INTRAVENOUS

## 2022-05-22 MED ORDER — CHLORHEXIDINE GLUCONATE CLOTH 2 % EX PADS
6.0000 | MEDICATED_PAD | Freq: Every day | CUTANEOUS | Status: DC
  Administered 2022-05-23 – 2022-06-14 (×21): 6 via TOPICAL

## 2022-05-22 MED ORDER — FENTANYL CITRATE (PF) 100 MCG/2ML IJ SOLN
INTRAMUSCULAR | Status: AC
  Filled 2022-05-22: qty 2

## 2022-05-22 SURGICAL SUPPLY — 48 items
BAG COUNTER SPONGE SURGICOUNT (BAG) IMPLANT
BAG SPNG CNTER NS LX DISP (BAG)
BASIN KIT SINGLE STR (MISCELLANEOUS) ×2 IMPLANT
BLADE OSC/SAGITTAL MD 5.5X18 (BLADE) IMPLANT
BLADE SURG 15 STRL LF DISP TIS (BLADE) ×2 IMPLANT
BLADE SURG 15 STRL SS (BLADE) ×1
BLADE SURG MINI STRL (BLADE) ×2 IMPLANT
BNDG CMPR 5X4 CHSV STRCH STRL (GAUZE/BANDAGES/DRESSINGS) ×1
BNDG COHESIVE 4X5 TAN STRL LF (GAUZE/BANDAGES/DRESSINGS) ×2 IMPLANT
BNDG ELASTIC 4X5.8 VLCR STR LF (GAUZE/BANDAGES/DRESSINGS) ×2 IMPLANT
BNDG ESMARCH 4 X 12 STRL LF (GAUZE/BANDAGES/DRESSINGS) ×1
BNDG ESMARCH 4X12 STRL LF (GAUZE/BANDAGES/DRESSINGS) ×2 IMPLANT
BNDG GAUZE DERMACEA FLUFF 4 (GAUZE/BANDAGES/DRESSINGS) ×2 IMPLANT
BNDG GZE DERMACEA 4 6PLY (GAUZE/BANDAGES/DRESSINGS) ×1
COVER LIGHT HANDLE STERIS (MISCELLANEOUS) ×4 IMPLANT
CUFF TOURN SGL QUICK 18X4 (TOURNIQUET CUFF) ×2 IMPLANT
DRAPE FLUOR MINI C-ARM 54X84 (DRAPES) ×2 IMPLANT
DURAPREP 26ML APPLICATOR (WOUND CARE) ×2 IMPLANT
ELECT REM PT RETURN 9FT ADLT (ELECTROSURGICAL) ×1
ELECTRODE REM PT RTRN 9FT ADLT (ELECTROSURGICAL) ×2 IMPLANT
GAUZE PAD ABD 8X10 STRL (GAUZE/BANDAGES/DRESSINGS) ×2 IMPLANT
GAUZE SPONGE 4X4 12PLY STRL (GAUZE/BANDAGES/DRESSINGS) IMPLANT
GLOVE BIO SURGEON STRL SZ7 (GLOVE) ×2 IMPLANT
GLOVE SURG UNDER POLY LF SZ7.5 (GLOVE) ×2 IMPLANT
GOWN STRL REUS W/ TWL LRG LVL3 (GOWN DISPOSABLE) ×4 IMPLANT
GOWN STRL REUS W/ TWL XL LVL3 (GOWN DISPOSABLE) ×2 IMPLANT
GOWN STRL REUS W/TWL LRG LVL3 (GOWN DISPOSABLE) ×2
GOWN STRL REUS W/TWL XL LVL3 (GOWN DISPOSABLE) ×1
HANDPIECE INTERPULSE COAX TIP (DISPOSABLE) ×1
HANDPIECE VERSAJET DEBRIDEMENT (MISCELLANEOUS) IMPLANT
IV NS 1000ML (IV SOLUTION) ×1
IV NS 1000ML BAXH (IV SOLUTION) ×2 IMPLANT
KIT TURNOVER KIT A (KITS) IMPLANT
MANIFOLD NEPTUNE II (INSTRUMENTS) ×2 IMPLANT
NEEDLE HYPO 22GX1.5 SAFETY (NEEDLE) IMPLANT
NS IRRIG 1000ML POUR BTL (IV SOLUTION) ×2 IMPLANT
PACK DRAINAGE HVY KERLIX W/SPG (MISCELLANEOUS) ×2 IMPLANT
PACK EXTREMITY ARMC (MISCELLANEOUS) ×2 IMPLANT
PAD ARMBOARD 7.5X6 YLW CONV (MISCELLANEOUS) ×4 IMPLANT
PULSAVAC PLUS IRRIG FAN TIP (DISPOSABLE) ×1
SET HNDPC FAN SPRY TIP SCT (DISPOSABLE) ×2 IMPLANT
STAPLER SKIN PROX 35W (STAPLE) ×2 IMPLANT
SUT PROLENE 2 0 FS (SUTURE) ×2 IMPLANT
SUT PROLENE 3 0 PS 2 (SUTURE) ×2 IMPLANT
SWAB CULTURE AMIES ANAERIB BLU (MISCELLANEOUS) IMPLANT
SYR CONTROL 10ML LL (SYRINGE) IMPLANT
TIP FAN IRRIG PULSAVAC PLUS (DISPOSABLE) IMPLANT
TRAP FLUID SMOKE EVACUATOR (MISCELLANEOUS) ×2 IMPLANT

## 2022-05-22 NOTE — Anesthesia Procedure Notes (Signed)
Procedure Name: Intubation Date/Time: 05/22/2022 4:31 PM  Performed by: Rolla Plate, CRNAPre-anesthesia Checklist: Patient identified, Patient being monitored, Timeout performed, Emergency Drugs available and Suction available Patient Re-evaluated:Patient Re-evaluated prior to induction Oxygen Delivery Method: Circle system utilized Preoxygenation: Pre-oxygenation with 100% oxygen Induction Type: IV induction Ventilation: Mask ventilation without difficulty Laryngoscope Size: 3 and McGraph Grade View: Grade I Tube type: Oral Tube size: 7.0 mm Number of attempts: 1 Airway Equipment and Method: Stylet Placement Confirmation: ETT inserted through vocal cords under direct vision, positive ETCO2 and breath sounds checked- equal and bilateral Secured at: 21 cm Tube secured with: Tape Dental Injury: Teeth and Oropharynx as per pre-operative assessment

## 2022-05-22 NOTE — Interval H&P Note (Signed)
History and Physical Interval Note:  05/22/2022 4:04 PM  [name] [name]  has presented today for surgery, with the diagnosis of Osteomyelitis.  The various methods of treatment have been discussed with the patient and family. After consideration of risks, benefits and other options for treatment, the patient has consented to  Procedure(s): IRRIGATION AND DEBRIDEMENT EXTREMITY (Right) with incision and drainage as a surgical intervention.  The patient's history has been reviewed, patient examined, no change in status, stable for surgery.  I have reviewed the patient's chart and labs.  Questions were answered to the patient's satisfaction.     This is considered an urgent/emergent case given that patient is experienced signs of severe sepsis in the setting of white count of 20 with a continuous worsening diabetic foot infection.  [name] [name]

## 2022-05-22 NOTE — Assessment & Plan Note (Signed)
Vitals:   05/21/22 2340 05/21/22 2344 05/21/22 2346  BP: 116/70    Pulse: 99    Temp: (!) 101.2 F (38.4 C)    Resp: (!) 23    Height:  '5\' 4"'$  (1.626 m)   Weight:  107.4 kg   SpO2: 99%  99%  TempSrc: Oral    BMI (Calculated):  40.62       Latest Ref Rng & Units 05/21/2022   11:39 PM 04/08/2022   11:17 AM 03/15/2022    8:16 PM  CBC  WBC 4.0 - 10.5 K/uL 19.0  4.4    Hemoglobin 12.0 - 15.0 g/dL 12.0  12.7  11.9   Hematocrit 36.0 - 46.0 % 37.0  38.3  35.0   Platelets 150 - 400 K/uL 239  274    Lactic 2.2.  lactated ringers 1,000 mL (05/22/22 0116)  Pharmacy consult for vancomycin. Follow culture and sensitivities.

## 2022-05-22 NOTE — Progress Notes (Signed)
Pt being followed by [facility] for Sepsis protocol. 

## 2022-05-22 NOTE — H&P (Addendum)
History and Physical    Chief Complaint: AMS   HISTORY OF PRESENT ILLNESS: [name] [name] is an 57 y.o. female  seen today for AMS.noted to have redness on right foot stump and has been on keflex. Amputation was 2 months ago. Report of pt being lethargic and  Not herself. Also report of foot pain unable to bear weight worse with ambulation reported.  Code sepsis initiated.  Pt is awake and has some confusion or poor understanding or health literacy.  Pt has  Past Medical History:  Diagnosis Date   Anxiety    Arthritis    Phreesia 02/08/2020   Asthma    mild intermittent   Asthma    Phreesia 02/08/2020   Bipolar 1 disorder ([place])    ect treatments last treatment Sep 02 1011   Depression    Depression    Phreesia 02/08/2020   Depression    Phreesia 07/10/2020   GERD (gastroesophageal reflux disease)    Headache    Hypertension    Pre-diabetes    Seizures ([place])    Thinks it was from taking Tramadol   Sleep apnea    wears CPAP, uncertain of setting   Substance abuse ([place])    Phreesia 02/08/2020     Review of Systems  Unable to perform ROS: Mental status change  Musculoskeletal:        Rt foot pain/ erythema noted.        Allergies  Allergen Reactions   Other    Penicillins Hives and Rash    No anaphylactic or severe cutaneous Sx > 10 yr ago, no additional medical attention required Tolerates Keflex and Rocephin   Tetracyclines & Related Other (See Comments)    Syncope and put her "in a coma"   Tramadol Other (See Comments)    Seizures   Phenazopyridine Other (See Comments)    Unknown   Tetracycline Other (See Comments)    Syncope and "Put me in a coma"   Ciprofloxacin Rash and Itching   Codeine Itching and Rash   Estradiol Rash    Patches broke out the skin   Past Surgical History:  Procedure Laterality Date   ABDOMINAL HYSTERECTOMY     AMPUTATION TOE Left 02/02/2018   Procedure: AMPUTATION TOE Left 4th toe;  Surgeon: [name], DPM;   Location: [facility];  Service: Podiatry;  Laterality: Left;   APPENDECTOMY N/A    Phreesia 02/08/2020   BACK SURGERY  2018   ACDF   C5-6 & C6-7 by Dr. Rennis Harding   CARPAL TUNNEL RELEASE     x2   [place] Right 10/25/2020   Procedure: IRRIGATION AND DEBRIDEMENT ABSCESS OF FOOT AND APPLICATION OF GRAFT;  Surgeon: [name], DPM;  Location: [facility];  Service: Podiatry;  Laterality: Right;   Laproscopic knee surgery Right    NECK SURGERY  03/15/2017   ACDF   by Dr. 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   aspirin (ASPIRIN CHILDRENS) 81 mg, Oral, 2 times daily, For 6 weeks for DVT prophylaxis after surgery   busPIRone (BUSPAR) 15 mg, Oral, 3 times daily   Calcium Carbonate-Vit D-Min (CALCIUM 1200 PO) 1,200 mg, Oral, Daily   DULoxetine (CYMBALTA) 60 MG capsule TAKE ONE CAPSULE BY MOUTH ONCE DAILY   Erenumab-aooe (AIMOVIG) 70 MG/ML SOAJ administer 162mUNDER THE SKIN every 30 DAYS   escitalopram (LEXAPRO) 10 mg, Oral, Daily   estradiol (ESTRACE) 1 mg, Oral, Daily at bedtime   famotidine (PEPCID) 40 MG tablet TAKE ONE TABLET BY MOUTH EVERYDAY AT BEDTIME   haloperidol (HALDOL) 5 mg, Oral, Daily at bedtime, For psychosis   lamoTRIgine (LAMICTAL) 100 mg, Oral, Daily   levocetirizine (XYZAL) 5 mg, Oral, Every evening    lidocaine (LIDODERM) 5 % 1 patch, Transdermal, Daily PRN, Remove & Discard patch within 12 hours or as directed by MD   Linzess 290 mcg, Oral, Every morning   methocarbamol (ROBAXIN-750) 750 mg, Oral, Every 8 hours PRN   metoprolol succinate (TOPROL-XL) 25 mg, Oral, Daily   Movantik 25 mg, Oral, Daily   Multiple Vitamins-Minerals (MULTIVITAMIN WITH MINERALS) tablet 1 tablet, Oral, Daily   naloxone (NARCAN) 0.4 mg, Nasal,  Once   oxyCODONE-acetaminophen (PERCOCET) 7.5-325 MG tablet 1 tablet, Oral, 5 times daily   pantoprazole (PROTONIX) 40 mg, Oral, 2 times daily   prazosin (MINIPRESS) 2 mg, Oral, Daily at bedtime   pregabalin (LYRICA) 100 mg, Oral, 2 times daily   promethazine (PHENERGAN) 25 mg, Oral, Every 8 hours PRN   solifenacin (VESICARE) 10 mg, Oral, Daily   spironolactone (ALDACTONE) 50 mg, Oral, Daily   SUMAtriptan (IMITREX) 50 MG tablet TAKE 1 TAB EVERY 2 HOURS AS NEEDED FOR MIGRAINE. MAY REPEAT IN 2 HOURS IF PERSISTS OR RECURS   topiramate (TOPAMAX) 50 mg, Oral, 2 times daily   traZODone (DESYREL) 200 mg, Oral, At bedtime PRN     aspirin  81 mg Oral BID   busPIRone  15 mg Oral TID   DULoxetine  20 mg Oral Daily   escitalopram  10 mg Oral Daily   estradiol  1 mg Oral QHS   haloperidol  5 mg Oral QHS   heparin injection (subcutaneous)  5,000 Units Subcutaneous Q8H   lamoTRIgine  100 mg Oral Daily   prazosin  2 mg Oral QHS   sodium chloride flush  3 mL Intravenous Q12H   topiramate  50 mg Oral BID      sodium chloride     [START ON 05/23/2022] cefTRIAXone (ROCEPHIN)  IV     vancomycin      ED Course: Pt in Ed and severe sepsis criteria. Vitals:   05/21/22 2340 05/21/22 2344 05/21/22 2346 05/22/22 0119  BP: 116/70   98/61  Pulse: 99   93  Resp: (!) 23   (!) 28  Temp: (!) 101.2 F (38.4 C)   (!) 100.5 F (38.1 C)  TempSrc: Oral   Oral  SpO2: 99%  99% 96%  Weight:  107.4 kg    Height:  '5\' 4"'$  (1.626 m)     No intake/output data recorded. SpO2: 96 % Blood work in  ed shows: Negative respiratory panel. CMP shows hyponatremia of 127, glucose 119, normal LFTs and kidney function. Leukocytosis Hemoglobin 12, platelet count 239. Lactic acid of 2.2. Results for orders placed  or performed during the hospital encounter of 05/21/22 (from the past 24 hour(s))  Resp panel by RT-PCR (RSV, Flu A&B, Covid) Anterior Nasal Swab     Status: None   Collection Time: 05/21/22 11:39 PM   Specimen: Anterior Nasal Swab  Result Value Ref Range   SARS Coronavirus 2 by RT PCR NEGATIVE NEGATIVE   Influenza A by PCR NEGATIVE NEGATIVE   Influenza B by PCR NEGATIVE NEGATIVE   Resp Syncytial Virus by PCR NEGATIVE NEGATIVE  Lactic acid, plasma     Status: Abnormal   Collection Time: 05/21/22 11:39 PM  Result Value Ref Range   Lactic Acid, Venous 2.2 (HH) 0.5 - 1.9 mmol/L  Comprehensive metabolic panel     Status: Abnormal   Collection Time: 05/21/22 11:39 PM  Result Value Ref Range   Sodium 127 (L) 135 - 145 mmol/L   Potassium 3.9 3.5 - 5.1 mmol/L   Chloride 95 (L) 98 - 111 mmol/L   CO2 24 22 - 32 mmol/L   Glucose, Bld 118 (H) 70 - 99 mg/dL   BUN 11 6 - 20 mg/dL   Creatinine, Ser 0.81 0.44 - 1.00 mg/dL   Calcium 8.9 8.9 - 10.3 mg/dL   Total Protein 7.5 6.5 - 8.1 g/dL   Albumin 3.4 (L) 3.5 - 5.0 g/dL   AST 17 15 - 41 U/L   ALT 12 0 - 44 U/L   Alkaline Phosphatase 85 38 - 126 U/L   Total Bilirubin 0.8 0.3 - 1.2 mg/dL   GFR, Estimated >60 >60 mL/min   Anion gap 8 5 - 15  CBC with Differential     Status: Abnormal   Collection Time: 05/21/22 11:39 PM  Result Value Ref Range   WBC 19.0 (H) 4.0 - 10.5 K/uL   RBC 3.80 (L) 3.87 - 5.11 MIL/uL   Hemoglobin 12.0 12.0 - 15.0 g/dL   HCT 37.0 36.0 - 46.0 %   MCV 97.4 80.0 - 100.0 fL   MCH 31.6 26.0 - 34.0 pg   MCHC 32.4 30.0 - 36.0 g/dL   RDW 14.0 11.5 - 15.5 %   Platelets 239 150 - 400 K/uL   nRBC 0.0 0.0 - 0.2 %   Neutrophils Relative % 89 %   Neutro Abs 17.0 (H) 1.7 - 7.7 K/uL   Lymphocytes Relative 5 %   Lymphs  Abs 0.9 0.7 - 4.0 K/uL   Monocytes Relative 4 %   Monocytes Absolute 0.7 0.1 - 1.0 K/uL   Eosinophils Relative 0 %   Eosinophils Absolute 0.0 0.0 - 0.5 K/uL   Basophils Relative 0 %   Basophils Absolute 0.0 0.0 - 0.1 K/uL   Immature Granulocytes 2 %   Abs Immature Granulocytes 0.33 (H) 0.00 - 0.07 K/uL  Protime-INR     Status: Abnormal   Collection Time: 05/21/22 11:39 PM  Result Value Ref Range   Prothrombin Time 15.3 (H) 11.4 - 15.2 seconds   INR 1.2 0.8 - 1.2  APTT     Status: None   Collection Time: 05/21/22 11:39 PM  Result Value Ref Range   aPTT 34 24 - 36 seconds  Lipase, blood     Status: None   Collection Time: 05/21/22 11:39 PM  Result Value Ref Range   Lipase 25 11 - 51 U/L  Troponin I (High Sensitivity)     Status: None   Collection Time: 05/21/22 11:39 PM  Result Value Ref Range   Troponin I (High Sensitivity) 5 <18 ng/L  Procalcitonin - Baseline     Status: None   Collection Time: 05/21/22 11:39 PM  Result Value Ref Range   Procalcitonin 0.46 ng/mL   *Note: Due to a large number of results and/or encounters for the requested time period, some results have not been displayed. A complete set of results can be found in Results Review.   In the emergency room patient given 1 L LR bolus along with vancomycin and Rocephin per sepsis protocol. Unresulted Labs (From admission, onward)     Start     Ordered   05/22/22 0500  Comprehensive metabolic panel  Tomorrow morning,   STAT        05/22/22 0127   05/22/22 0500  CBC  Tomorrow morning,   STAT        05/22/22 0127   05/22/22 0124  CBC  (heparin)  Once,   STAT       Comments: Baseline for heparin therapy IF NOT ALREADY DRAWN.  Notify MD if PLT < 100 K.    05/22/22 0127   05/22/22 0124  HIV Antibody (routine testing w rflx)  (HIV Antibody (Routine testing w reflex) panel)  Once,   URGENT        05/22/22 0127   05/21/22 2340  Urinalysis, w/ Reflex to Culture (Infection Suspected) -Urine, Clean Catch  (Septic  presentation on arrival (screening labs, nursing and treatment orders for obvious sepsis))  Once,   URGENT       Question:  Specimen Source  Answer:  Urine, Clean Catch   05/21/22 2340   05/21/22 2339  Lactic acid, plasma  (Septic presentation on arrival (screening labs, nursing and treatment orders for obvious sepsis))  Now then every 2 hours,   STAT      05/21/22 2340   05/21/22 2339  Blood Culture (routine x 2)  (Septic presentation on arrival (screening labs, nursing and treatment orders for obvious sepsis))  BLOOD CULTURE X 2,   STAT      05/21/22 2340   05/21/22 2339  Blood gas, venous  (Septic presentation on arrival (screening labs, nursing and treatment orders for obvious sepsis))  ONCE - STAT,   STAT        05/21/22 2340           Pt has received : Orders Placed This Encounter  Procedures   1-3 Lead EKG Interpretation    This order was created via procedure documentation    Standing Status:   Standing    Number of Occurrences:   1   Critical Care    This order was created via procedure documentation    Standing Status:   Standing    Number of Occurrences:   1   Resp panel by RT-PCR (RSV, Flu A&B, Covid) Anterior Nasal Swab    Standing Status:   Standing    Number of Occurrences:   1   Blood Culture (routine x 2)    Standing Status:   Standing    Number of Occurrences:   2   DG Chest Port 1 View    Standing Status:   Standing    Number of Occurrences:   1    Order Specific Question:   Reason for Exam (SYMPTOM  OR DIAGNOSIS REQUIRED)    Answer:   Questionable sepsis - evaluate for abnormality   DG Foot Complete Right    Standing Status:   Standing    Number of Occurrences:   1    Order  Specific Question:   Symptom/Reason for Exam    Answer:   Sepsis ([place]) [[id number]]   Lactic acid, plasma    Standing Status:   Standing    Number of Occurrences:   2   Comprehensive metabolic panel    Standing Status:   Standing    Number of Occurrences:   1   CBC with Differential     Standing Status:   Standing    Number of Occurrences:   1   Protime-INR    Standing Status:   Standing    Number of Occurrences:   1   APTT    Standing Status:   Standing    Number of Occurrences:   1   Lipase, blood    Standing Status:   Standing    Number of Occurrences:   1   Blood gas, venous    Standing Status:   Standing    Number of Occurrences:   1   Urinalysis, w/ Reflex to Culture (Infection Suspected) -Urine, Clean Catch    Standing Status:   Standing    Number of Occurrences:   1    Order Specific Question:   Specimen Source    Answer:   Urine, Clean Catch [76]   Procalcitonin - Baseline    Standing Status:   Standing    Number of Occurrences:   1   CBC    Baseline for heparin therapy IF NOT ALREADY DRAWN.  Notify MD if PLT < 100 K.    Standing Status:   Standing    Number of Occurrences:   1   HIV Antibody (routine testing w rflx)    Standing Status:   Standing    Number of Occurrences:   1   Comprehensive metabolic panel    Standing Status:   Standing    Number of Occurrences:   1   CBC    Standing Status:   Standing    Number of Occurrences:   1   Diet NPO time specified    Standing Status:   Standing    Number of Occurrences:   1   Cardiac monitoring    Standing Status:   Standing    Number of Occurrences:   1   Document height and weight    Standing Status:   Standing    Number of Occurrences:   1   Assess and Document Glasgow Coma Scale    Standing Status:   Standing    Number of Occurrences:   1   Document vital signs within 1-hour of fluid bolus completion. Notify provider of abnormal vital signs despite fluid resuscitation.    Standing Status:   Standing    Number of Occurrences:   1   DO NOT delay antibiotics if unable to obtain blood culture.    Standing Status:   Standing    Number of Occurrences:   1   Refer to Sidebar Report: Sepsis Sidebar ED/IP    Sepsis Sidebar ED/IP    Standing Status:   Standing    Number of Occurrences:   1    Notify provider for difficulties obtaining IV access.    Standing Status:   Standing    Number of Occurrences:   1   Insert peripheral IV x 2    Angiocath size 20G or larger    Standing Status:   Standing    Number of Occurrences:   1   Initiate Carrier Fluid Protocol  Standing Status:   Standing    Number of Occurrences:   1   In and Out Cath    Standing Status:   Standing    Number of Occurrences:   1   Maintain IV access    Standing Status:   Standing    Number of Occurrences:   1   Vital signs    Standing Status:   Standing    Number of Occurrences:   1   Notify physician (specify)    Standing Status:   Standing    Number of Occurrences:   93    Order Specific Question:   Notify Physician    Answer:   for pulse less than 55 or greater than 120    Order Specific Question:   Notify Physician    Answer:   for respiratory rate less than 12 or greater than 25    Order Specific Question:   Notify Physician    Answer:   for temperature greater than 100.5 F    Order Specific Question:   Notify Physician    Answer:   for urinary output less than 30 mL/hr for four hours    Order Specific Question:   Notify Physician    Answer:   for systolic BP less than 90 or greater than 818, diastolic BP less than 60 or greater than 100    Order Specific Question:   Notify Physician    Answer:   for new hypoxia w/ oxygen saturations < 88%   Progressive Mobility Protocol: No Restrictions    Standing Status:   Standing    Number of Occurrences:   1   Daily weights    Standing Status:   Standing    Number of Occurrences:   1   Intake and Output    Standing Status:   Standing    Number of Occurrences:   1   Initiate Oral Care Protocol    Standing Status:   Standing    Number of Occurrences:   1   Initiate Carrier Fluid Protocol    Standing Status:   Standing    Number of Occurrences:   1   RN may order General Admission PRN Orders utilizing "General Admission PRN medications" (through  manage orders) for the following patient needs: allergy symptoms (Claritin), cold sores (Carmex), cough (Robitussin DM), eye irritation (Liquifilm Tears), hemorrhoids (Tucks), indigestion (Maalox), minor skin irritation (Hydrocortisone Cream), muscle pain (Ben Gay), nose irritation (saline nasal spray) and sore throat (Chloraseptic spray).    Standing Status:   Standing    Number of Occurrences:   [id number]   Wound care    Standing Status:   Standing    Number of Occurrences:   1   Full code    Standing Status:   Standing    Number of Occurrences:   1    Order Specific Question:   By:    Answer:   Other   Code Sepsis activation.  This occurs automatically when order is signed and prioritizes pharmacy, lab, and radiology services for STAT collections and interventions.  If [facility] downtime, call [facility] [phone]) to activate Code Sepsis.    Standing Status:   Standing    Number of Occurrences:   1    Order Specific Question:   Initiate "Code Sepsis" tracking    Answer:   Yes    Order Specific Question:   Contact eLink ([phone])    Answer:   No  Order Specific Question:   Reason for Consult?    Answer:   tracking   Consult to hospitalist    Standing Status:   Standing    Number of Occurrences:   1    Order Specific Question:   Place call to:    Answer:   hospitalist    Order Specific Question:   Reason for Consult    Answer:   Admit    Order Specific Question:   Diagnosis/Clinical Info for Consult:    Answer:   sepsis likely from right foot infection, AMS    Comments:   Karma Greaser, ED#5, 5902   vancomycin per pharmacy consult    Standing Status:   Standing    Number of Occurrences:   1    Order Specific Question:   Indication:    Answer:   Sepsis    Order Specific Question:   Other Indication:    Answer:   stump infection   Airborne and Contact precautions    Standing Status:   Standing    Number of Occurrences:   1   Pulse oximetry, continuous    Standing Status:   Standing     Number of Occurrences:   1   Pulse oximetry check with vital signs    Standing Status:   Standing    Number of Occurrences:   1   Oxygen therapy Mode or (Route): Nasal cannula; Liters Per Minute: 2; Keep 02 saturation: greater than 92 %    Standing Status:   Standing    Number of Occurrences:   20    Order Specific Question:   Mode or (Route)    Answer:   Nasal cannula    Order Specific Question:   Liters Per Minute    Answer:   2    Order Specific Question:   Keep 02 saturation    Answer:   greater than 92 %   ED EKG 12-Lead    Standing Status:   Standing    Number of Occurrences:   1    Order Specific Question:   Notes    Answer:   Baseline   EKG 12-Lead    Standing Status:   Standing    Number of Occurrences:   1   EKG 12-Lead    Standing Status:   Standing    Number of Occurrences:   1   EKG 12-Lead    Standing Status:   Standing    Number of Occurrences:   1   Admit to Inpatient (patient's expected length of stay will be greater than 2 midnights or inpatient only procedure)    Standing Status:   Standing    Number of Occurrences:   1    Order Specific Question:   Hospital Area    Answer:   [place] [100120]    Order Specific Question:   Level of Care    Answer:   Telemetry Medical [104]    Order Specific Question:   Covid Evaluation    Answer:   Asymptomatic - no recent exposure (last 10 days) testing not required    Order Specific Question:   Diagnosis    Answer:   AMS (altered mental status) [5681275]    Order Specific Question:   Admitting Physician    Answer:   Cherylann Ratel    Order Specific Question:   Attending Physician    Answer:   Cherylann Ratel    Order  Specific Question:   Certification:    Answer:   I certify this patient will need inpatient services for at least 2 midnights    Order Specific Question:   Estimated Length of Stay    Answer:   2   Aspiration precautions    Standing Status:   Standing     Number of Occurrences:   1   Fall precautions    Standing Status:   Standing    Number of Occurrences:   1    Meds ordered this encounter  Medications   lactated ringers bolus 1,000 mL    Order Specific Question:   Reason 30 mL/kg dose is not being ordered    Answer:   First Lactic Acid Pending   vancomycin (VANCOCIN) IVPB 1000 mg/200 mL premix    Order Specific Question:   Indication:    Answer:   Cellulitis   DISCONTD: aztreonam (AZACTAM) 2 g in sodium chloride 0.9 % 100 mL IVPB    Order Specific Question:   Antibiotic Indication:    Answer:   Other Indication (list below)    Order Specific Question:   Other Indication:    Answer:   Cellulitis   cefTRIAXone (ROCEPHIN) 2 g in sodium chloride 0.9 % 100 mL IVPB    Order Specific Question:   Antibiotic Indication:    Answer:   Cellulitis    Order Specific Question:   Other Indication:    Answer:   sepsis   acetaminophen (TYLENOL) tablet 1,000 mg   lactated ringers bolus 1,000 mL    Order Specific Question:   BMI >/= 35, Ideal Body Weight basis in kg for 30 mL/kg bolus delivery    Answer:   54.7   cefTRIAXone (ROCEPHIN) 2 g in sodium chloride 0.9 % 100 mL IVPB    Order Specific Question:   Antibiotic Indication:    Answer:   Cellulitis    Order Specific Question:   Other Indication:    Answer:   sepsis   busPIRone (BUSPAR) tablet 15 mg   aspirin chewable tablet 81 mg    For 6 weeks for DVT prophylaxis after surgery     albuterol (PROVENTIL) (2.5 MG/3ML) 0.083% nebulizer solution 3 mL    Inhale 2 puffs = 140mg into the lungs every 6 (six) hours as needed for wheezing or shortness of breath.     DULoxetine (CYMBALTA) DR capsule 20 mg    TAKE ONE CAPSULE BY MOUTH ONCE DAILY     escitalopram (LEXAPRO) tablet 10 mg   estradiol (ESTRACE) tablet 1 mg   haloperidol (HALDOL) tablet 5 mg    For psychosis     lamoTRIgine (LAMICTAL) tablet 100 mg   topiramate (TOPAMAX) tablet 50 mg   prazosin (MINIPRESS) capsule 2 mg   sodium chloride  flush (NS) 0.9 % injection 3 mL   OR Linked Order Group    acetaminophen (TYLENOL) tablet 650 mg    acetaminophen (TYLENOL) suppository 650 mg   HYDROcodone-acetaminophen (NORCO/VICODIN) 5-325 MG per tablet 1 tablet   0.9 %  sodium chloride infusion   heparin injection 5,000 Units   vancomycin (VANCOREADY) IVPB 1500 mg/300 mL    Order Specific Question:   Indication:    Answer:   Wound Infection     Admission Imaging : DG Foot Complete Right  Result Date: 05/22/2022 CLINICAL DATA:  [id number] [place] [id number]: RIGHT FOOT COMPLETE - 3+ VIEW COMPARISON:  None Available. FINDINGS: All 5 ray amputation. No cortical erosion  or destruction. There is no evidence of fracture or dislocation. Plantar and posterior calcaneal spur. There is no evidence of arthropathy or other focal bone abnormality. Subcutaneus soft tissue edema. IMPRESSION: All 5 ray amputation with no radiographic findings to suggest osteomyelitis. Limited evaluation due to overlapping osseous structures and overlying soft tissues. If high clinical concern, please consider MRI for further evaluation (with intravenous contrast if GFR greater than 30). Electronically Signed   By: Iven Finn M.D.   On: 05/22/2022 00:37   DG Chest Port 1 View  Result Date: 05/22/2022 CLINICAL DATA:  Question of sepsis.  Evaluate for abnormality. EXAM: PORTABLE CHEST 1 VIEW COMPARISON:  03/15/2022 FINDINGS: Shallow inspiration. Heart size and pulmonary vascularity are normal. Lungs are clear. No pleural effusions. No pneumothorax. Mediastinal contours appear intact. Postoperative changes with fixation of the thoracolumbar spine. Postoperative changes also in the cervical spine and right shoulder. IMPRESSION: No active disease. Electronically Signed   By: Lucienne Capers M.D.   On: 05/22/2022 00:35    Physical Examination: Vitals:   05/21/22 2340 05/21/22 2344 05/21/22 2346 05/22/22 0119  BP: 116/70   98/61  Pulse: 99   93  Temp: (!) 101.2 F  (38.4 C)   (!) 100.5 F (38.1 C)  Resp: (!) 23   (!) 28  Height:  '5\' 4"'$  (1.626 m)    Weight:  107.4 kg    SpO2: 99%  99% 96%  TempSrc: Oral   Oral  BMI (Calculated):  40.62     Physical Exam Vitals and nursing note reviewed.  Constitutional:      General: She is not in acute distress.    Appearance: She is not ill-appearing, toxic-appearing or diaphoretic.  HENT:     Head: Normocephalic and atraumatic.     Right Ear: Hearing and external ear normal.     Left Ear: Hearing and external ear normal.     Nose: Nose normal. No nasal deformity.     Mouth/Throat:     Lips: Pink.     Mouth: Mucous membranes are moist.     Tongue: No lesions.     Pharynx: Oropharynx is clear.  Eyes:     Extraocular Movements: Extraocular movements intact.  Neck:     Vascular: No carotid bruit.  Cardiovascular:     Rate and Rhythm: Normal rate and regular rhythm.     Pulses: Normal pulses.     Heart sounds: Normal heart sounds.  Pulmonary:     Effort: Pulmonary effort is normal.     Breath sounds: Normal breath sounds.  Abdominal:     General: Bowel sounds are normal. There is no distension.     Palpations: Abdomen is soft. There is no mass.     Tenderness: There is no abdominal tenderness. There is no guarding.     Hernia: No hernia is present.  Musculoskeletal:        General: Swelling present.     Right lower leg: No edema.     Left lower leg: No edema.  Skin:    General: Skin is warm.     Findings: Erythema present.  Neurological:     General: No focal deficit present.     Mental Status: She is disoriented.     Cranial Nerves: Cranial nerves 2-12 are intact.     Motor: Motor function is intact.  Psychiatric:        Attention and Perception: Attention normal.        Mood and Affect:  Mood normal.        Speech: Speech normal.        Behavior: Behavior normal. Behavior is cooperative.        Cognition and Memory: Cognition normal.     Assessment and Plan: * Cellulitis of foot,  right We will cont pt on IV ABX regimen. MRI per am team as deemed necessary.  Elevation. Wound care.  Severe sepsis ([place]) Vitals:   05/21/22 2340 05/21/22 2344 05/21/22 2346  BP: 116/70    Pulse: 99    Temp: (!) 101.2 F (38.4 C)    Resp: (!) 23    Height:  '5\' 4"'$  (1.626 m)   Weight:  107.4 kg   SpO2: 99%  99%  TempSrc: Oral    BMI (Calculated):  40.62       Latest Ref Rng & Units 05/21/2022   11:39 PM 04/08/2022   11:17 AM 03/15/2022    8:16 PM  CBC  WBC 4.0 - 10.5 K/uL 19.0  4.4    Hemoglobin 12.0 - 15.0 g/dL 12.0  12.7  11.9   Hematocrit 36.0 - 46.0 % 37.0  38.3  35.0   Platelets 150 - 400 K/uL 239  274    Lactic 2.2.  lactated ringers 1,000 mL (05/22/22 0116)  Pharmacy consult for vancomycin. Follow culture and sensitivities.     AMS (altered mental status) Disoriented 2/2 to sepsis.  We will obtain ct head noncontrast.  No reports of fall.    Essential hypertension Vitals:   05/21/22 2340 05/22/22 0119  BP: 116/70 98/61   Hold metoprolol and aldactone.  MIVF.    GERD (gastroesophageal reflux disease) IV PPI therapy.    DVT prophylaxis:  Heparin   Code Status:  Full code   Family Communication:  Dillon,[place]ia (Mother)  [phone]    Disposition Plan:  Home   Consults called:  None  Admission status: Inpatient.    Unit/ Expected LOS: Med surg. / 2-3 days.    Para Skeans MD [facility]  6 PM- 2 AM. Please contact me via secure Chat 6 PM-2 AM. [phone]( Pager ) To contact the [facility] Attending or Consulting provider [place] or covering provider during after hours [place], for this patient.   Check the care team in [facility] and look for a) attending/consulting [facility] provider listed and b) the [facility] team listed Log into [email or web address] and use [place]'s universal password to access. If you do not have the password, please contact the hospital operator. Locate the [facility] provider you are looking for under [facility] and page  to a number that you can be directly reached. If you still have difficulty reaching the provider, please page the [facility] (Director on Call) for the Hospitalists listed on amion for assistance. [email or web address] 05/22/2022, 2:16 AM

## 2022-05-22 NOTE — Assessment & Plan Note (Signed)
Vitals:   05/21/22 2340 05/22/22 0119  BP: 116/70 98/61   Hold metoprolol and aldactone.  MIVF.

## 2022-05-22 NOTE — Assessment & Plan Note (Signed)
IV PPI therapy.  

## 2022-05-22 NOTE — ED Notes (Signed)
MD Posey Pronto notified that patient continues to be hypotensive at 88/51 MAP 62 after 271m bolus and maintenance NS increased to 1545mhr. Requested to advise.

## 2022-05-22 NOTE — Anesthesia Preprocedure Evaluation (Signed)
Anesthesia Evaluation  Patient identified by MRN, date of birth, ID band Patient awake    Reviewed: Allergy & Precautions, H&P , NPO status , Patient's Chart, lab work & pertinent test results, reviewed documented beta blocker date and time   History of Anesthesia Complications Negative for: history of anesthetic complications  Airway Mallampati: III  TM Distance: >3 FB Neck ROM: full    Dental  (+) Dental Advidsory Given, Missing, Poor Dentition   Pulmonary neg shortness of breath, asthma , sleep apnea , neg COPD, neg recent URI   Pulmonary exam normal breath sounds clear to auscultation       Cardiovascular Exercise Tolerance: Good hypertension, (-) angina (-) Past MI and (-) Cardiac Stents Normal cardiovascular exam(-) dysrhythmias (-) Valvular Problems/Murmurs Rhythm:regular Rate:Normal     Neuro/Psych  Headaches, Seizures -, Well Controlled,  PSYCHIATRIC DISORDERS Anxiety Depression Bipolar Disorder      GI/Hepatic Neg liver ROS,GERD  ,,  Endo/Other  neg diabetes  Morbid obesity  Renal/GU negative Renal ROS  negative genitourinary   Musculoskeletal   Abdominal   Peds  Hematology negative hematology ROS (+)   Anesthesia Other Findings Past Medical History: No date: Anxiety No date: Arthritis     Comment:  Phreesia 02/08/2020 No date: Asthma     Comment:  mild intermittent No date: Asthma     Comment:  Phreesia 02/08/2020 No date: Bipolar 1 disorder ([place])     Comment:  ect treatments last treatment Sep 02 1011 No date: Depression No date: Depression     Comment:  Phreesia 02/08/2020 No date: Depression     Comment:  Phreesia 07/10/2020 No date: GERD (gastroesophageal reflux disease) No date: Headache No date: Hypertension No date: Pre-diabetes No date: Seizures ([place])     Comment:  Thinks it was from taking Tramadol No date: Sleep apnea     Comment:  wears CPAP, uncertain of setting No date:  Substance abuse ([place])     Comment:  Phreesia 02/08/2020   Reproductive/Obstetrics negative OB ROS                             Anesthesia Physical Anesthesia Plan  ASA: 3 and emergent  Anesthesia Plan: General   Post-op Pain Management:    Induction: Intravenous  PONV Risk Score and Plan: 3 and Ondansetron, Dexamethasone, Treatment may vary due to age or medical condition and Midazolam  Airway Management Planned: Oral ETT  Additional Equipment:   Intra-op Plan:   Post-operative Plan: Extubation in OR  Informed Consent: I have reviewed the patients History and Physical, chart, labs and discussed the procedure including the risks, benefits and alternatives for the proposed anesthesia with the patient or authorized representative who has indicated his/her understanding and acceptance.     Dental Advisory Given  Plan Discussed with: Anesthesiologist, CRNA and Surgeon  Anesthesia Plan Comments:        Anesthesia Quick Evaluation

## 2022-05-22 NOTE — Transfer of Care (Signed)
Immediate Anesthesia Transfer of Care Note  Patient: [name] [name]  Procedure(s) Performed: IRRIGATION AND DEBRIDEMENT EXTREMITY (Right)  Patient Location: PACU  Anesthesia Type:General  Level of Consciousness: sedated  Airway & Oxygen Therapy: Patient Spontanous Breathing and Patient connected to face mask oxygen  Post-op Assessment: Report given to RN and Post -op Vital signs reviewed and stable  Post vital signs: Reviewed  Last Vitals:  Vitals Value Taken Time  BP 87/58 05/22/22 1651  Temp    Pulse 77 05/22/22 1652  Resp 14 05/22/22 1652  SpO2 98 % 05/22/22 1652  Vitals shown include unvalidated device data.  Last Pain:         Complications: No notable events documented.

## 2022-05-22 NOTE — Progress Notes (Signed)
OR RN here to pick patient up, report given

## 2022-05-22 NOTE — Consult Note (Signed)
NAME:  ALAYIA MEGGISON, MRN:  [id number], DOB:  [birth year], LOS: 0 ADMISSION DATE:  05/21/2022, CONSULTATION DATE:  05/22/2022 REFERRING MD:  Fritzi Mandes, MD, CHIEF COMPLAINT:  Septic Shock   History of Present Illness:  Ms. Tuft is a 57 year old female with a past medical history of DMII and a right food trans-metatarsal amputation presenting to the hospital with altered mental status.  The patient has altered mental status and is unable to provide any meaningful history. She does not know where she is, how she got here, or what is going on. History is obtained from chart review. She was recently noted to have redness of her right foot stump and was started on Keflex (s/p amputation 2 months ago).   On presentation to the ED, she was noted to be tachycardic and febrile. She was admitted to the hospitalist service for the management of sepsis secondary to soft tissue infection. WBC count elevated to 19k on prsentation. Xray of the foot is non-diagnostic. Following this, she developed worsening hypotension requiring IV fluids as well as the initiation of nor-epinephrine. She was given Cefriaxone and Vancomycin upon admission. She received a liter of LR in the ED on presentation and a liter on worsening hypotension today.  Pertinent  Medical History  -Type 2 Diabetes -Bipolar Disorder -Depression -Seizure Disorder -OSA -Mild intermittent asthma  Significant Hospital Events: Including procedures, antibiotic start and stop dates in addition to other pertinent events   05/21/22: presentation to ED with AMS, Ceftriaxone and Vanc and 1 L LR given 05/22/22: worsening hypotension, sepsis, admit to ICU, pressors initiated  Interim History / Subjective:  Reports lower extremity pain  Objective   Blood pressure (!) 94/55, pulse 78, temperature 99 F (37.2 C), temperature source Oral, resp. rate 15, height '5\' 4"'$  (1.626 m), weight 108.1 kg, SpO2 94 %.        Intake/Output Summary (Last 24 hours) at  05/22/2022 1238 Last data filed at 05/22/2022 1004 Gross per 24 hour  Intake 3 ml  Output --  Net 3 ml   Filed Weights   05/21/22 2344 05/22/22 1221  Weight: 107.4 kg 108.1 kg    Examination: Physical Exam Constitutional:      General: She is not in acute distress.    Appearance: She is obese. She is ill-appearing.  HENT:     Head: Normocephalic.     Mouth/Throat:     Mouth: Mucous membranes are dry.  Cardiovascular:     Rate and Rhythm: Regular rhythm. Tachycardia present.  Pulmonary:     Effort: Pulmonary effort is normal.     Breath sounds: Normal breath sounds. No wheezing or rhonchi.  Abdominal:     General: There is distension.  Musculoskeletal:     Comments: Right foot with trans-metatarsal amputation. Tense, fluctuant, and warm. Tender to palpation. No crepitus.  Skin:    General: Skin is warm.  Neurological:     Mental Status: She is disoriented.      Assessment & Plan:   Neurology #BiPolar Disorder #Toxic Metabolic Encephalopathy #Depression  On Duloxetine (60 mg daily), Buspirone (15 mg tid), escitalopram (10 mg daily), halopreidol (5 mg daily), Lamotrigine (100 mg daily), oxycodone-acetaminophen (10 mg - 325 mg 1 tablet qid PRN), pregabalin (100 mg tid), and topiramate (50 mg bid).  Presents now with AMS, toxic metabolic encephalopathy in the setting of sepsis secondary to infection. Home medications continued on admission. Given encephalopathy, will hold haloperidol today. VBG checked and the patient was  not hypercapnic, will repeat.  -VBG  Cardiovascular #Septic Shock  Meets SIRS criteria with source of infection and persistent hypotension. Started on Nor-epinephrine to maintain MAP of 65 mmHg. She has received a liter of LR for hypotension, goal 30 mL/kg of IBW (1800 mL total). IV antibiotics administered per sepsis bundle, Will re-check a lactic acid level. TTE from 2021 was within normal. Seen by cardiology 02/2022 with atypical chest pain and plans  for a coronary CT.  -titrate nor-epi nephrine for goal MAP of 65 -complete 30 mL / kg of IBW fluid bolus -check lactic acid -antibiotics and source control per ID section  Pulmonary #OSA  Unclear if the patient is on CPAP. Was seen for this in the past, and in December was re-ordered an Auto-CPAP. Can continue once we clarify settings with family and the patient is more awake.  -titrate [place] to SpO2 of 92%  Gastrointestinal #GERD  NPO for now  Renal  Mild hyponatremia on presentation. Otherwise kidney function is normal with no electrolyte disturbances.  Endocrine #Type 2 Diabetes  Glycemic control with protocol, sensitive sliding scale  Hem/Onc  Heparin subQ for prophylaxis  ID #Soft Tissue Infection  In the setting of recent trans-metatarsal amputation. Tense and fluctuant on exam, with warmth and redness. No crepitus palpated. Given concern for abscess vs osteomyelitis, will obtain a CT of the foot with IV contrast and consult podiatry. Patient to be started on broad spectrum antibiotics (Meropenem/Vanc/Clinda) pending source control and defervescence. Blood cultures drawn and no growth to date.   -continue IV meropenem, vancomycin and clindamycin -CT foot with IV contrast -consult podiatry   Best Practice (right click and "Reselect all SmartList Selections" daily)   Diet/type: NPO DVT prophylaxis: prophylactic heparin  GI prophylaxis: N/A Lines: N/A Foley:  N/A Code Status:  full code Last date of multidisciplinary goals of care discussion [05/22/2022]  Labs   CBC: Recent Labs  Lab 05/21/22 2339 05/22/22 0203 05/22/22 0520  WBC 19.0* 18.7* 20.3*  NEUTROABS 17.0*  --   --   HGB 12.0 10.7* 10.5*  HCT 37.0 32.4* 32.1*  MCV 97.4 97.3 97.0  PLT 239 201 956    Basic Metabolic Panel: Recent Labs  Lab 05/21/22 2339 05/22/22 0203  NA 127* 131*  K 3.9 4.0  CL 95* 99  CO2 24 25  GLUCOSE 118* 101*  BUN 11 10  CREATININE 0.81 0.79  CALCIUM 8.9 8.3*    GFR: Estimated Creatinine Clearance: 92.1 mL/min (by C-G formula based on SCr of 0.79 mg/dL). Recent Labs  Lab 05/21/22 2339 05/22/22 0203 05/22/22 0520  PROCALCITON 0.46  --   --   WBC 19.0* 18.7* 20.3*  LATICACIDVEN 2.2* 2.3*  --     Liver Function Tests: Recent Labs  Lab 05/21/22 2339 05/22/22 0203  AST 17 18  ALT 12 10  ALKPHOS 85 71  BILITOT 0.8 0.7  PROT 7.5 5.8*  ALBUMIN 3.4* 2.7*   Recent Labs  Lab 05/21/22 2339  LIPASE 25   No results for input(s): "AMMONIA" in the last 168 hours.  ABG    Component Value Date/Time   HCO3 26.6 05/22/2022 0203   TCO2 22 03/15/2022 2016   ACIDBASEDEF 5.0 (H) 01/28/2007 0350   O2SAT 43.6 05/22/2022 0203     Coagulation Profile: Recent Labs  Lab 05/21/22 2339  INR 1.2    Cardiac Enzymes: No results for input(s): "CKTOTAL", "CKMB", "CKMBINDEX", "TROPONINI" in the last 168 hours.  HbA1C: Hgb A1c MFr Bld  Date/Time  Value Ref Range Status  04/08/2022 11:17 AM 5.3 4.8 - 5.6 % Final    Comment:    (NOTE)         Prediabetes: 5.7 - 6.4         Diabetes: >6.4         Glycemic control for adults with diabetes: <7.0   08/26/2019 06:56 AM 5.6 4.8 - 5.6 % Final    Comment:    (NOTE) Pre diabetes:          5.7%-6.4% Diabetes:              >6.4% Glycemic control for   <7.0% adults with diabetes     CBG: Recent Labs  Lab 05/22/22 1226  GLUCAP 122*    Review of Systems:   Unable to obtain  Past Medical History:  She,  has a past medical history of Anxiety, Arthritis, Asthma, Asthma, Bipolar 1 disorder ([place]), Depression, Depression, Depression, GERD (gastroesophageal reflux disease), Headache, Hypertension, Pre-diabetes, Seizures ([place]), Sleep apnea, and Substance abuse ([place]).   Surgical History:   Past Surgical History:  Procedure Laterality Date   ABDOMINAL HYSTERECTOMY     AMPUTATION TOE Left 02/02/2018   Procedure: AMPUTATION TOE Left 4th toe;  Surgeon: [name], DPM;  Location: [place];   Service: Podiatry;  Laterality: Left;   APPENDECTOMY N/A    Phreesia 02/08/2020   BACK SURGERY  2018   ACDF   C5-6 & C6-7 by Dr. Rennis Harding   CARPAL TUNNEL RELEASE     x2   [place] Right 10/25/2020   Procedure: IRRIGATION AND DEBRIDEMENT ABSCESS OF FOOT AND APPLICATION OF GRAFT;  Surgeon: [name], DPM;  Location: [facility];  Service: Podiatry;  Laterality: Right;   Laproscopic knee surgery Right    NECK SURGERY  03/15/2017   ACDF   by Dr. Patrice Paradise   PLANTAR FASCIA RELEASE     x2   REVERSE SHOULDER ARTHROPLASTY Right 04/14/2022   Procedure: REVERSE SHOULDER ARTHROPLASTY;  Surgeon: Hiram Gash, MD;  Location: [facility];  Service: Orthopedics;  Laterality: Right;   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   Procedure: TRANSMETATARSAL AMPUTATION;  Surgeon: [name], DPM;  Location: [facility];  Service: Podiatry;  Laterality: Right;     Social History:   reports that she has never smoked. She has never used smokeless tobacco. She reports that she does not drink alcohol and does not use drugs.   Family History:  Her family history includes COPD in her mother; Cancer in her father; Diabetes in her mother; Heart disease in her brother, daughter, father, maternal grandfather, maternal grandmother, and paternal grandfather; Hyperlipidemia in her father and mother; Hypertension in her father, maternal grandmother, and mother; Kidney cancer in her paternal grandmother; Sleep apnea in her brother, father, maternal grandmother, and mother.   Allergies Allergies  Allergen Reactions   Other    Penicillins Hives and Rash    No anaphylactic or severe cutaneous Sx > 10 yr ago, no additional medical attention required Tolerates Keflex and Rocephin   Tetracyclines & Related Other (See Comments)    Syncope and put her "in a coma"   Tramadol Other (See Comments)    Seizures   Phenazopyridine Other (See Comments)    Unknown   Tetracycline Other (See Comments)     Syncope and "Put me in a coma"   Ciprofloxacin Rash and Itching   Codeine Itching and Rash   Estradiol Rash  Patches broke out the skin     Home Medications  Prior to Admission medications   Medication Sig Start Date End Date Taking? Authorizing Provider  acetaminophen (TYLENOL) 500 MG tablet Take 1 tablet (500 mg total) by mouth every 8 (eight) hours as needed for moderate pain. 04/15/22 04/15/23  Chadwell, Vonna Kotyk, PA-C  albuterol (PROAIR HFA) 108 (90 Base) MCG/ACT inhaler Inhale 2 puffs = 139mg into the lungs every 6 (six) hours as needed for wheezing or shortness of breath. 07/11/20   MEulogio Bear NP  aspirin (ASPIRIN CHILDRENS) 81 MG chewable tablet Chew 1 tablet (81 mg total) by mouth 2 (two) times daily. For 6 weeks for DVT prophylaxis after surgery 04/15/22 05/27/22  Chadwell, JVonna Kotyk PA-C  busPIRone (BUSPAR) 15 MG tablet Take 15 mg by mouth 3 (three) times daily.  04/26/17   [provider]  Calcium Carbonate-Vit D-Min (CALCIUM 1200 PO) Take 1,200 mg by mouth daily.     [provider]  DULoxetine (CYMBALTA) 60 MG capsule TAKE ONE CAPSULE BY MOUTH ONCE DAILY 04/20/22   PSusy Frizzle MD  Erenumab-aooe (AIMOVIG) 70 MG/ML SOAJ administer 163mUNDER THE SKIN every 30 DAYS 02/08/22   PiSusy FrizzleMD  escitalopram (LEXAPRO) 10 MG tablet Take 10 mg by mouth daily. 08/23/19   [provider]  estradiol (ESTRACE) 1 MG tablet Take 1 tablet (1 mg total) by mouth at bedtime. 07/27/21   PiSusy FrizzleMD  famotidine (PEPCID) 40 MG tablet TAKE ONE TABLET BY MOUTH EVERYDAY AT BEDTIME 04/20/22   PiSusy FrizzleMD  haloperidol (HALDOL) 5 MG tablet Take 1 tablet (5 mg total) by mouth at bedtime. For psychosis 11/15/15   MaBarton DuboisMD  lamoTRIgine (LAMICTAL) 100 MG tablet Take 100 mg by mouth daily. 08/06/21   [provider]  levocetirizine (XYZAL) 5 MG tablet TAKE ONE TABLET BY MOUTH EVERY EVENING 04/20/22   PiSusy FrizzleMD  lidocaine  (LIDODERM) 5 % Place 1 patch onto the skin daily as needed (pain). Remove & Discard patch within 12 hours or as directed by MD    [provider]  LINZESS 290 MCG CAPS capsule TAKE ONE CAPSULE BY MOUTH EVERY MORNING Patient not taking: Reported on 04/06/2022 10/28/21   PiSusy FrizzleMD  methocarbamol (ROBAXIN-750) 750 MG tablet Take 1 tablet (750 mg total) by mouth every 8 (eight) hours as needed for muscle spasms. 04/15/22   Chadwell, JoVonna KotykPA-C  metoprolol succinate (TOPROL-XL) 25 MG 24 hr tablet Take 1 tablet (25 mg total) by mouth daily. 10/23/21   BeLorretta HarpMD  MOVANTIK 25 MG TABS tablet Take 25 mg by mouth daily.    [provider]  Multiple Vitamins-Minerals (MULTIVITAMIN WITH MINERALS) tablet Take 1 tablet by mouth daily.    [provider]  naloxone (NSur[facility]burgnasal spray 4 mg/0.1 mL Place 0.4 mg into the nose once. 08/03/21   [provider]  oxyCODONE-acetaminophen (PERCOCET) 7.5-325 MG tablet Take 1 tablet by mouth 5 (five) times daily.    [provider]  pantoprazole (PROTONIX) 40 MG tablet Take 1 tablet (40 mg total) by mouth 2 (two) times daily. 02/02/22   PiSusy FrizzleMD  prazosin (MINIPRESS) 2 MG capsule Take 2 mg by mouth at bedtime.    [provider]  pregabalin (LYRICA) 100 MG capsule Take 1 capsule (100 mg total) by mouth 2 (two) times daily. Patient taking differently: Take 100 mg by mouth 3 (three) times daily.  07/27/21   [name], MD  promethazine (PHENERGAN) 25 MG tablet Take 1 tablet (25 mg total) by mouth every 8 (eight) hours as needed for nausea or vomiting. Patient not taking: Reported on 04/06/2022 03/18/21   [name], DPM  solifenacin (VESICARE) 10 MG tablet TAKE ONE TABLET BY MOUTH DAILY 04/26/22   [name], MD  spironolactone (ALDACTONE) 50 MG tablet TAKE ONE TABLET BY MOUTH DAILY 03/23/22   [name], MD  SUMAtriptan (IMITREX) 50 MG tablet TAKE 1 TAB EVERY 2  HOURS AS NEEDED FOR MIGRAINE. MAY REPEAT IN 2 HOURS IF PERSISTS OR RECURS 08/30/19   [name], MD  topiramate (TOPAMAX) 50 MG tablet TAKE ONE TABLET BY MOUTH TWICE DAILY 04/20/22   [name], MD  traZODone (DESYREL) 100 MG tablet Take 200 mg by mouth at bedtime as needed for sleep. 07/29/14   [provider]     Critical care time: 10 minutes    Armando Reichert, MD [facility] 05/22/2022 1:04 PM

## 2022-05-22 NOTE — Consult Note (Addendum)
Pharmacy Antibiotic Note  [name] [name] is a 57 y.o. female admitted on 05/21/2022 with AMS and right foot stump pain and redness. 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 Taking PO cephalexin PTA and recent Rx for PO bactrim 05/20/22 (unsure if started) Pharmacy has been consulted for vancomycin and Meropenem dosing.   Plan: Vancomycin 1000 mg x 1 given in ED.  Continue Vancomycin 1500 mg Q24H. Goal AUC 400-550 Estimated AUC 476/Cmin: 10 Scr 0.81, IBW, Vd 0.5 (BMI 40)  Meropenem 1 gm IV Q8H  Height: '5\' 4"'$  (162.6 cm) Weight: 107.4 kg (236 lb 12.4 oz) IBW/kg (Calculated) : 54.7  Temp (24hrs), Avg:99.9 F (37.7 C), Min:98.6 F (37 C), Max:101.2 F (38.4 C)  Recent Labs  Lab 05/21/22 2339 05/22/22 0203 05/22/22 0520  WBC 19.0* 18.7* 20.3*  CREATININE 0.81  --   --   LATICACIDVEN 2.2* 2.3*  --      Estimated Creatinine Clearance: 90.6 mL/min (by C-G formula based on SCr of 0.81 mg/dL).    Allergies  Allergen Reactions   Other    Penicillins Hives and Rash    No anaphylactic or severe cutaneous Sx > 10 yr ago, no additional medical attention required Tolerates Keflex and Rocephin   Tetracyclines & Related Other (See Comments)    Syncope and put her "in a coma"   Tramadol Other (See Comments)    Seizures   Phenazopyridine Other (See Comments)    Unknown   Tetracycline Other (See Comments)    Syncope and "Put me in a coma"   Ciprofloxacin Rash and Itching   Codeine Itching and Rash   Estradiol Rash    Patches broke out the skin    Antimicrobials this admission: 2/3 vancomycin >>  2/3 Meropenem >> 2/3 ceftriaxone x 1 dose   Dose adjustments this admission:   Microbiology results: 2/2 BCx: NGTD  Thank you for allowing pharmacy to be a part of this patient's care.  Alison Murray, PharmD Clinical Pharmacist   05/22/2022 11:49 AM

## 2022-05-22 NOTE — ED Notes (Signed)
Informed rn bed assigned

## 2022-05-22 NOTE — Op Note (Signed)
Surgeon: Surgeon(s): Felipa Furnace, DPM  Assistants: None Pre-operative diagnosis: Osteomyelitis  Post-operative diagnosis: same Procedure: Procedure(s) (LRB): IRRIGATION AND DEBRIDEMENT EXTREMITY (Right)  Pathology:  ID Type Source Tests Collected by Time Destination  A : right foot abscess Abscess Foot, Right AEROBIC/ANAEROBIC CULTURE W GRAM STAIN (SURGICAL/DEEP WOUND) Felipa Furnace, [facility] 05/22/2022 1638     Pertinent Intra-op findings: 10 cc of purulent drainage was decompressed from the transmetatarsal medial side amputation stump.  No further purulent drainage noted after pulse lavage Anesthesia: Choice  Hemostasis: * Missing tourniquet times found for documented tourniquets in log: [id number] * EBL: 50 cc Materials: Iodoform packing Injectables: None Complications: None  Indications for surgery: A 57 y.o. female presents with right foot severe foot infection with underlying sepsis. Patient has failed all conservative therapy including but not limited to local wound care IV antibiotics. She wishes to have surgical correction of the foot/deformity. It was determined that patient would benefit from right foot. Informed surgical risk consent was reviewed and read aloud to the patient.  I reviewed the films.  I have discussed my findings with the patient in great detail.  I have discussed all risks including but not limited to infection, stiffness, scarring, limp, disability, deformity, damage to blood vessels and nerves, numbness, poor healing, need for braces, arthritis, chronic pain, amputation, death.  All benefits and realistic expectations discussed in great detail.  I have made no promises as to the outcome.  I have provided realistic expectations.  I have offered the patient a 2nd opinion, which they have declined and assured me they preferred to proceed despite the risks   Procedure in detail: The patient was both verbally and visually identified by myself, the nursing staff, and  anesthesia staff in the preoperative holding area. They were then transferred to the operating room and placed on the operative table in supine position.  Attention was directed to the transmetatarsal amputation site, using skin marker incision was delineated along the amputation site.  At this time incision was carried down to the level of the bone.  Purulent drainage was noted did not appear to be near any of the amputation site bone..  Tracking plantarly.  The wound was thoroughly decompressed and all the purulent drainage was expressed.  Prelavage culture was taken and sent to microbiology the incision site and the wound was thoroughly irrigated with normal saline solution and pulse lavage.  At this time it was determined that wound can be stayed open to allow any further infection to drain.  The wound was packed with iodoform packing Kerlix gauze ABD pad and Ace bandage.  All bony prominences were adequately padded.  Disposition  Patient will return to the OR for revisional incision drainage and delayed primary closure.  Nonweightbearing to the right lower extremity.  At the conclusion of the procedure the patient was awoken from anesthesia and found to have tolerated the procedure well any complications. There were transferred to PACU with vital signs stable and vascular status intact.  Boneta Lucks, DPM

## 2022-05-22 NOTE — Progress Notes (Signed)
An USGPIV (ultrasound guided PIV) has been placed for short-term vasopressor infusion. A correctly placed ivWatch must be used when administering Vasopressors. Should this treatment be needed beyond 72 hours, central line access should be obtained.  It will be the responsibility of the bedside nurse to follow best practice to prevent extravasations.   

## 2022-05-22 NOTE — Consult Note (Signed)
  Subjective:  Patient ID: [name] [name], female    DOB: 1965/12/12,  MRN: [id number]  A 57 y.o. female presents with severe sepsis of the right foot with cellulitis with white count of 20.  Patient states that she has been having a lot of pain has been progressively getting worse.  She is admitted to the ICU.  She has been feeling lethargic.  She is known to Dr. Jacqualyn Posey Objective:   Vitals:   05/22/22 1530 05/22/22 1545  BP: (!) 101/54 (!) 107/57  Pulse: 77 89  Resp: 17 16  Temp:    SpO2: 95% 98%   General AA&O x3. Normal mood and affect.  Vascular Dorsalis pedis and posterior tibial pulses 2/4 bilat. Brisk capillary refill to all digits. Pedal hair present.  Neurologic Epicritic sensation grossly intact.  Dermatologic Right history of transmetatarsal amputation with worsening cellulitis and fluctuance to the medial side of the foot.  Cellulitis noted up to the leg.  Orthopedic: MMT 5/5 in dorsiflexion, plantarflexion, inversion, and eversion. Normal joint ROM without pain or crepitus.   IMPRESSION: 1. Approximally 26 cc fluid collection with marginal enhancement along the medial distal stump region, favoring abscess. This abuts the anterior margins of the amputated first and second metatarsals but no bony destructive findings are identified to specifically indicate osteomyelitis. 2. Thickening of the plantar fascia likely from chronic fasciitis. 3. Plantar and Achilles calcaneal spurs. 4. Subcutaneous edema dorsally along the ankle and circumferentially along the distal stump region.  Assessment & Plan:  Patient was evaluated and treated and all questions answered.  Right severe foot infection with underlying sepsis -All questions and concerns were discussed with the patient in extensive detail. -Given the nature of the infection patient will benefit from incision drainage washout debridement emergently.  I made the decision to take to the patient to the OR today for  incision drainage washout likely leave it open.  Patient will need to return to the operating room on Monday for closure -Nonweightbearing to the right lower extremity -Betadine wet-to-dry dressing -No concern for osteomyelitis however we will look at it intraoperatively to see if there is any concern for bone infection.   Felipa Furnace, DPM  Accessible via secure chat for questions or concerns.

## 2022-05-22 NOTE — Progress Notes (Signed)
Pt has arrived back from OR, no issues. 2L [place], no pressors, sleepy and will rouse to voice easily, no complaints of pain at this time

## 2022-05-22 NOTE — ED Notes (Signed)
Pt provided with ice water per request  Pt repositioned in bed to drink water, no further needs expressed at this time, call light is in reach, WCTM.

## 2022-05-22 NOTE — ED Notes (Signed)
Patient was helped up and was able to ambulate to the toilet with the assistance of a walker.  She is coherent now, and does not remember coming to the hospital and she is unaware of how she got here.  Patient was changed into a brief and gown.

## 2022-05-22 NOTE — Consult Note (Signed)
Pharmacy Antibiotic Note  [name] [name] is a 57 y.o. female admitted on 05/21/2022 with AMS and right foot stump pain and redness. 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 Taking PO cephalexin PTA and recent Rx for PO bactrim 05/20/22 (unsure if started) Pharmacy has been consulted for vancomycin dosing. Patient also receiving ceftriaxone 2 gram daily  Plan: Vancomycin 1000 mg x 1 given in ED.  Initiate Vancomycin 1500 mg Q24H. Goal AUC 400-550 Estimated AUC 476/Cmin: 10 Scr 0.81, IBW, Vd 0.5 (BMI 40)   Height: '5\' 4"'$  (162.6 cm) Weight: 107.4 kg (236 lb 12.4 oz) IBW/kg (Calculated) : 54.7  Temp (24hrs), Avg:100.9 F (38.3 C), Min:100.5 F (38.1 C), Max:101.2 F (38.4 C)  Recent Labs  Lab 05/21/22 2339  WBC 19.0*  CREATININE 0.81  LATICACIDVEN 2.2*    Estimated Creatinine Clearance: 90.6 mL/min (by C-G formula based on SCr of 0.81 mg/dL).    Allergies  Allergen Reactions   Other    Penicillins Hives and Rash    No anaphylactic or severe cutaneous Sx > 10 yr ago, no additional medical attention required Tolerates Keflex and Rocephin   Tetracyclines & Related Other (See Comments)    Syncope and put her "in a coma"   Tramadol Other (See Comments)    Seizures   Phenazopyridine Other (See Comments)    Unknown   Tetracycline Other (See Comments)    Syncope and "Put me in a coma"   Ciprofloxacin Rash and Itching   Codeine Itching and Rash   Estradiol Rash    Patches broke out the skin    Antimicrobials this admission: 2/3 vancomycin >>  2/3 ceftriaxone >>   Dose adjustments this admission:   Microbiology results: 2/2 BCx: sent  Thank you for allowing pharmacy to be a part of this patient's care.  Dorothe Pea, PharmD, BCPS Clinical Pharmacist   05/22/2022 2:09 AM

## 2022-05-22 NOTE — ED Notes (Signed)
MD Posey Pronto Notified of hypotension.  MD ordered an additional Bolus and Levo. This RN did administer bolus will administer Levo upon pharmacy reconciliation.

## 2022-05-22 NOTE — Plan of Care (Signed)
Patient remains in ICU post surgery to RLE. Patient is not requiring vasopressor support at the time of writing. Supplemental O2 at 2 L/min via [place]. Patient remains drowsy but otherwise neurologically intact.  Addendum: 0530 hrs: dressing to right foot changed (ABD, Kerlix, ACE wrap), surgical packing left alone in place. Patient denies sensation to the surgical area.  Problem: Education: Goal: Knowledge of General Education information will improve Description: Including pain rating scale, medication(s)/side effects and non-pharmacologic comfort measures Outcome: Progressing   Problem: Health Behavior/Discharge Planning: Goal: Ability to manage health-related needs will improve Outcome: Progressing   Problem: Clinical Measurements: Goal: Ability to maintain clinical measurements within normal limits will improve Outcome: Progressing Goal: Will remain free from infection Outcome: Progressing Goal: Diagnostic test results will improve Outcome: Progressing Goal: Respiratory complications will improve Outcome: Progressing Goal: Cardiovascular complication will be avoided Outcome: Progressing   Problem: Activity: Goal: Risk for activity intolerance will decrease Outcome: Progressing   Problem: Nutrition: Goal: Adequate nutrition will be maintained Outcome: Progressing   Problem: Coping: Goal: Level of anxiety will decrease Outcome: Progressing   Problem: Elimination: Goal: Will not experience complications related to bowel motility Outcome: Progressing Goal: Will not experience complications related to urinary retention Outcome: Progressing   Problem: Pain Managment: Goal: General experience of comfort will improve Outcome: Progressing   Problem: Safety: Goal: Ability to remain free from injury will improve Outcome: Progressing   Problem: Skin Integrity: Goal: Risk for impaired skin integrity will decrease Outcome: Progressing

## 2022-05-22 NOTE — Assessment & Plan Note (Signed)
Disoriented 2/2 to sepsis.  We will obtain ct head noncontrast.  No reports of fall.

## 2022-05-22 NOTE — ED Notes (Signed)
MD Posey Pronto notified of patient current BP 90/54 MAP 64 and requested recommendations if patient needs more than current maintenance fluids.

## 2022-05-22 NOTE — ED Notes (Signed)
Breakfast tray delivered, pt endorsing she turned it away as she is not hungry. Pt denies needs at this time, repositioned in bed for comfort

## 2022-05-22 NOTE — Progress Notes (Signed)
[name] [place] NAME: [name] [name]    MR#:  [id number]  DATE OF BIRTH:  19-Sep-1959  SUBJECTIVE:  no family at bedside. Patient was brought in with increasing lithology. She had fever 101.2 low blood pressure and very tachycardic. Lethargic today however answer three questions appropriately. Found to have infected right foot amputation stump  VITALS:  Blood pressure (!) 88/51, pulse 81, temperature 99.2 F (37.3 C), temperature source Oral, resp. rate 16, height '5\' 4"'$  (1.626 m), weight 107.4 kg, SpO2 94 %.  PHYSICAL EXAMINATION:   GENERAL:  57 y.o.-year-old patient with no acute distress. Critically ill, lethargic LUNGS: Normal breath sounds bilaterally, no wheezing CARDIOVASCULAR: S1, S2 normal. No murmur  tachycardia+ ABDOMEN: Soft, nontender, nondistended. EXTREMITIES:     NEUROLOGIC: nonfocal  patient is alert and awake SKIN: as above  LABORATORY PANEL:  CBC Recent Labs  Lab 05/22/22 0520  WBC 20.3*  HGB 10.5*  HCT 32.1*  PLT 212    Chemistries  Recent Labs  Lab 05/21/22 2339  NA 127*  K 3.9  CL 95*  CO2 24  GLUCOSE 118*  BUN 11  CREATININE 0.81  CALCIUM 8.9  AST 17  ALT 12  ALKPHOS 85  BILITOT 0.8   Cardiac Enzymes No results for input(s): "TROPONINI" in the last 168 hours. RADIOLOGY:  DG Foot Complete Right  Result Date: 05/22/2022 CLINICAL DATA:  [id number] Sepsis [facility]) [id number] EXAM: RIGHT FOOT COMPLETE - 3+ VIEW COMPARISON:  None Available. FINDINGS: All 5 ray amputation. No cortical erosion or destruction. There is no evidence of fracture or dislocation. Plantar and posterior calcaneal spur. There is no evidence of arthropathy or other focal bone abnormality. Subcutaneus soft tissue edema. IMPRESSION: All 5 ray amputation with no radiographic findings to suggest osteomyelitis. Limited evaluation due to overlapping osseous structures and overlying soft tissues. If high clinical concern, please consider  MRI for further evaluation (with intravenous contrast if GFR greater than 30). Electronically Signed   By: Iven Finn M.D.   On: 05/22/2022 00:37   DG Chest Port 1 View  Result Date: 05/22/2022 CLINICAL DATA:  Question of sepsis.  Evaluate for abnormality. EXAM: PORTABLE CHEST 1 VIEW COMPARISON:  03/15/2022 FINDINGS: Shallow inspiration. Heart size and pulmonary vascularity are normal. Lungs are clear. No pleural effusions. No pneumothorax. Mediastinal contours appear intact. Postoperative changes with fixation of the thoracolumbar spine. Postoperative changes also in the cervical spine and right shoulder. IMPRESSION: No active disease. Electronically Signed   By: Lucienne Capers M.D.   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  septic shock suspected due to right foot amputation stump infection -- patient recently was started outpatient by podiatry on PO Keflex. She came in with altered mental status, fever 101.2, tachycardia, elevated white count of 20,000 and cellulitis right amputation stump.-- Currently on IV vancomycin and IV Rocephin -- patient received fluids per sepsis protocol however continues to remain hypotensive. Will start on IV levophed -- discussed with PC CM attending Dr.Dgayli and will admit to step down under their service. -- Followed blood culture, CBC -- podiatry consultation with Dr. Posey Pronto from [facility]. Recommends MRI of the  right foot. Will get once she is hemodynamically stable. --Trend lactate   leukocytosis due to above  Depression anxiety bipolar disorder history of seizure disorder -- on  Cymbalta and Lexapro along with Haldol -- patient also is on lamotrigine and Topamax  Gerd -- PPI  Chronic pain syndrome recent right shoulder arthroplasty -- PRN pain meds  Morbid obesity with history of sleep apnea -- uses CPAP at home     Procedures: Family communication :spoke with mother Chad Consults :PCCM, Podiatry CODE STATUS: FULL (pt has AMS) no family at bedside. Mother could not tell me. Will need to be readdressed DVT Prophylaxis :Heparin Level of care: ICU Status is: Inpatient Remains inpatient appropriate because: septic shock    TOTAL critical TIME TAKING CARE OF THIS PATIENT: 45 minutes.  >50% time spent on counselling and coordination of care  Note: This dictation was prepared with Dragon dictation along with smaller phrase technology. Any transcriptional errors that result from this process are unintentional.  Fritzi Mandes M.D    [facility]   CC: Primary care physician; [name], MD

## 2022-05-22 NOTE — Assessment & Plan Note (Signed)
We will cont pt on IV ABX regimen. MRI per am team as deemed necessary.  Elevation. Wound care.

## 2022-05-23 DIAGNOSIS — L03115 Cellulitis of right lower limb: Secondary | ICD-10-CM | POA: Diagnosis not present

## 2022-05-23 LAB — BASIC METABOLIC PANEL
Anion gap: 7 (ref 5–15)
BUN: 13 mg/dL (ref 6–20)
CO2: 19 mmol/L — ABNORMAL LOW (ref 22–32)
Calcium: 8.6 mg/dL — ABNORMAL LOW (ref 8.9–10.3)
Chloride: 108 mmol/L (ref 98–111)
Creatinine, Ser: 0.56 mg/dL (ref 0.44–1.00)
GFR, Estimated: >60 mL/min (ref >60)
Glucose, Bld: 148 mg/dL — ABNORMAL HIGH (ref 70–99)
Potassium: 3.8 mmol/L (ref 3.5–5.1)
Sodium: 134 mmol/L — ABNORMAL LOW (ref 135–145)

## 2022-05-23 LAB — GLUCOSE, CAPILLARY
Glucose-Capillary: 125 mg/dL — ABNORMAL HIGH (ref 70–99)
Glucose-Capillary: 130 mg/dL — ABNORMAL HIGH (ref 70–99)
Glucose-Capillary: 149 mg/dL — ABNORMAL HIGH (ref 70–99)
Glucose-Capillary: 166 mg/dL — ABNORMAL HIGH (ref 70–99)
Glucose-Capillary: 166 mg/dL — ABNORMAL HIGH (ref 70–99)
Glucose-Capillary: 182 mg/dL — ABNORMAL HIGH (ref 70–99)

## 2022-05-23 LAB — CBC
HCT: 30.4 % — ABNORMAL LOW (ref 36.0–46.0)
Hemoglobin: 10 g/dL — ABNORMAL LOW (ref 12.0–15.0)
MCH: 31.4 pg (ref 26.0–34.0)
MCHC: 32.9 g/dL (ref 30.0–36.0)
MCV: 95.6 fL (ref 80.0–100.0)
Platelets: 206 10*3/uL (ref 150–400)
RBC: 3.18 MIL/uL — ABNORMAL LOW (ref 3.87–5.11)
RDW: 14 % (ref 11.5–15.5)
WBC: 19.9 10*3/uL — ABNORMAL HIGH (ref 4.0–10.5)
nRBC: 0 % (ref 0.0–0.2)

## 2022-05-23 LAB — MAGNESIUM: Magnesium: 2.1 mg/dL (ref 1.7–2.4)

## 2022-05-23 MED ORDER — OXYCODONE-ACETAMINOPHEN 7.5-325 MG PO TABS
1.0000 | ORAL_TABLET | Freq: Every day | ORAL | Status: DC | PRN
  Administered 2022-05-23 – 2022-06-04 (×35): 1 via ORAL
  Filled 2022-05-23 (×35): qty 1

## 2022-05-23 MED ORDER — ENOXAPARIN SODIUM 60 MG/0.6ML IJ SOSY
0.5000 mg/kg | PREFILLED_SYRINGE | INTRAMUSCULAR | Status: DC
  Administered 2022-05-23 – 2022-05-27 (×5): 55 mg via SUBCUTANEOUS
  Filled 2022-05-23 (×5): qty 0.6

## 2022-05-23 MED ORDER — PREGABALIN 50 MG PO CAPS
100.0000 mg | ORAL_CAPSULE | Freq: Three times a day (TID) | ORAL | Status: DC
  Administered 2022-05-23 – 2022-05-31 (×21): 100 mg via ORAL
  Filled 2022-05-23 (×21): qty 2

## 2022-05-23 MED ORDER — OXYCODONE-ACETAMINOPHEN 7.5-325 MG PO TABS: 1.0000 | ORAL_TABLET | Freq: Every day | ORAL | Status: DC

## 2022-05-23 NOTE — Anesthesia Postprocedure Evaluation (Signed)
Anesthesia Post Note  Patient: [name] [name]  Procedure(s) Performed: IRRIGATION AND DEBRIDEMENT EXTREMITY (Right)  Patient location during evaluation: ICU Anesthesia Type: General Level of consciousness: awake and alert Pain management: pain level controlled Vital Signs Assessment: post-procedure vital signs reviewed and stable Respiratory status: spontaneous breathing, nonlabored ventilation, respiratory function stable and patient connected to nasal cannula oxygen Cardiovascular status: blood pressure returned to baseline and stable Postop Assessment: no apparent nausea or vomiting Anesthetic complications: no  No notable events documented.   Last Vitals:  Vitals:   05/23/22 0936 05/23/22 1000  BP: 123/70 112/64  Pulse: 68 65  Resp: (!) 22 13  Temp:    SpO2: 99% 100%    Last Pain:  Vitals:   05/23/22 0800  TempSrc: Oral  PainSc: [place]

## 2022-05-23 NOTE — Progress Notes (Signed)
PHARMACIST - PHYSICIAN COMMUNICATION  CONCERNING:  Enoxaparin (Lovenox) for DVT Prophylaxis   DESCRIPTION: Patient was prescribed enoxaprin '40mg'$  q24 hours for VTE prophylaxis.   Filed Weights   05/21/22 2344 05/22/22 1221  Weight: 107.4 kg (236 lb 12.4 oz) 108.1 kg (238 lb 5.1 oz)    Body mass index is 40.91 kg/m.  Estimated Creatinine Clearance: 92.1 mL/min (by C-G formula based on SCr of 0.56 mg/dL).   Based on [place] patient is candidate for enoxaparin 0.'5mg'$ /kg TBW SQ every 24 hours based on BMI being >30.  RECOMMENDATION: Pharmacy has adjusted enoxaparin dose per [facility] policy.  Patient is now receiving enoxaparin 55 mg every 24 hours    Vick Frees, PharmD Clinical Pharmacist  05/23/2022 4:05 PM

## 2022-05-23 NOTE — Progress Notes (Signed)
  PROGRESS NOTE    [name] [name]  CWU:[id number] DOB: [birth year] DOA: 05/21/2022 PCP: [name] Frizzle, MD  132A/132A-BB  LOS: 1 day   Brief hospital course:   Assessment & Plan: [name] [name] is 57 year old female with history of bipolar disorder, total arthroplasty right shoulder December 2023, chronic pain syndrome, anxiety depression, recent right toe amputation who presented to emergency room with altered mental status and redness of the right foot amputation stump.   Septic shock  altered mental status, fever 101.2, tachycardia, elevated white count of 20,000 and cellulitis and abscess of right amputation stump.  Hypotensive despite IVF per sepsis protocol and had to be on pressor levo briefly.  right foot amputation stump infection with abscess and osteomyelitis -- patient recently was started outpatient by podiatry on PO Keflex.  --started on vanc/meropenem and clinda --received OR I/D on 05/22/22 Plan: --cont vanc and meropenem --d/c clinda --ID consult tomorrow   Depression anxiety bipolar disorder history of seizure disorder --cont Buspar, Cymbalta and Lexapro  --cont lamotrigine and Topamax   Chronic pain syndrome recent right shoulder arthroplasty --cont home Percocet and Lyrica   Morbid obesity with history of sleep apnea --CPAP nightly    DVT prophylaxis: Lovenox SQ Code Status: Full code  Family Communication:  Level of care: Med-Surg Dispo:   The patient is from: home Anticipated d/c is to: to be determined Anticipated d/c date is: to be detemrined   Subjective and Interval History:  No pain in her foot.  Normal oral intake.   Objective: Vitals:   05/23/22 0800 05/23/22 0936 05/23/22 1000 05/23/22 1108  BP: (!) 113/57 123/70 112/64 122/66  Pulse: 74 68 65 82  Resp: (!) 25 (!) '22 13 16  '$ Temp: 98 F (36.7 C)   98.2 F (36.8 C)  TempSrc: Oral     SpO2: 99% 99% 100% 98%  Weight:      Height:        Intake/Output Summary (Last 24 hours)  at 05/23/2022 1535 Last data filed at 05/23/2022 1000 Gross per 24 hour  Intake 1631.21 ml  Output 1750 ml  Net -118.79 ml   Filed Weights   05/21/22 2344 05/22/22 1221  Weight: 107.4 kg 108.1 kg    Examination:   Constitutional: NAD, AAOx3 HEENT: conjunctivae and lids normal, EOMI CV: No cyanosis.   RESP: normal respiratory effort, on RA Extremities: right foot wrapped Neuro: II - XII grossly intact.   Psych: Normal mood and affect.  Appropriate judgement and reason   Data Reviewed: I have personally reviewed labs and imaging studies  Time spent: 50 minutes  Enzo Bi, MD [facility] If 7PM-7AM, please contact night-coverage 05/23/2022, 3:35 PM

## 2022-05-24 ENCOUNTER — Inpatient Hospital Stay: Payer: 59 | Admitting: Anesthesiology

## 2022-05-24 ENCOUNTER — Encounter
Admission: EM | Disposition: A | Discharge status: Home Health | Payer: Self-pay | Source: Home / Self Care | Attending: Osteopathic Medicine

## 2022-05-24 ENCOUNTER — Other Ambulatory Visit: Payer: Self-pay

## 2022-05-24 ENCOUNTER — Encounter: Payer: Self-pay | Admitting: Podiatry

## 2022-05-24 ENCOUNTER — Other Ambulatory Visit: Payer: Self-pay | Admitting: Family Medicine

## 2022-05-24 DIAGNOSIS — A419 Sepsis, unspecified organism: Secondary | ICD-10-CM | POA: Diagnosis not present

## 2022-05-24 DIAGNOSIS — L02611 Cutaneous abscess of right foot: Secondary | ICD-10-CM | POA: Diagnosis not present

## 2022-05-24 DIAGNOSIS — R6520 Severe sepsis without septic shock: Secondary | ICD-10-CM | POA: Diagnosis not present

## 2022-05-24 DIAGNOSIS — L03115 Cellulitis of right lower limb: Secondary | ICD-10-CM | POA: Diagnosis not present

## 2022-05-24 HISTORY — PX: INCISION AND DRAINAGE: SHX5863

## 2022-05-24 LAB — GLUCOSE, CAPILLARY
Glucose-Capillary: 93 mg/dL (ref 70–99)
Glucose-Capillary: 94 mg/dL (ref 70–99)
Glucose-Capillary: 84 mg/dL (ref 70–99)
Glucose-Capillary: 83 mg/dL (ref 70–99)
Glucose-Capillary: 81 mg/dL (ref 70–99)

## 2022-05-24 LAB — BASIC METABOLIC PANEL
Anion gap: 9 (ref 5–15)
BUN: 16 mg/dL (ref 6–20)
CO2: 20 mmol/L — ABNORMAL LOW (ref 22–32)
Calcium: 8.5 mg/dL — ABNORMAL LOW (ref 8.9–10.3)
Chloride: 107 mmol/L (ref 98–111)
Creatinine, Ser: 0.62 mg/dL (ref 0.44–1.00)
GFR, Estimated: >60 mL/min (ref >60)
Glucose, Bld: 109 mg/dL — ABNORMAL HIGH (ref 70–99)
Potassium: 3.7 mmol/L (ref 3.5–5.1)
Sodium: 136 mmol/L (ref 135–145)

## 2022-05-24 LAB — CBC
HCT: 28.7 % — ABNORMAL LOW (ref 36.0–46.0)
Hemoglobin: 9.6 g/dL — ABNORMAL LOW (ref 12.0–15.0)
MCH: 31.9 pg (ref 26.0–34.0)
MCHC: 33.4 g/dL (ref 30.0–36.0)
MCV: 95.3 fL (ref 80.0–100.0)
Platelets: 263 10*3/uL (ref 150–400)
RBC: 3.01 MIL/uL — ABNORMAL LOW (ref 3.87–5.11)
RDW: 14.2 % (ref 11.5–15.5)
WBC: 17.4 10*3/uL — ABNORMAL HIGH (ref 4.0–10.5)
nRBC: 0 % (ref 0.0–0.2)

## 2022-05-24 LAB — MAGNESIUM: Magnesium: 2.2 mg/dL (ref 1.7–2.4)

## 2022-05-24 SURGERY — INCISION AND DRAINAGE
Anesthesia: General | Laterality: Right

## 2022-05-24 MED ORDER — FENTANYL CITRATE (PF) 100 MCG/2ML IJ SOLN
INTRAMUSCULAR | Status: DC | PRN
  Administered 2022-05-24: 25 ug via INTRAVENOUS
  Administered 2022-05-24: 50 ug via INTRAVENOUS
  Administered 2022-05-24: 25 ug via INTRAVENOUS

## 2022-05-24 MED ORDER — MIDAZOLAM HCL 2 MG/2ML IJ SOLN
INTRAMUSCULAR | Status: DC | PRN
  Administered 2022-05-24: 2 mg via INTRAVENOUS

## 2022-05-24 MED ORDER — BUPIVACAINE HCL 0.5 % IJ SOLN
INTRAMUSCULAR | Status: DC | PRN
  Administered 2022-05-24: 12 mL

## 2022-05-24 MED ORDER — BUPIVACAINE HCL (PF) 0.5 % IJ SOLN
INTRAMUSCULAR | Status: AC
  Filled 2022-05-24: qty 30

## 2022-05-24 MED ORDER — LACTATED RINGERS IV SOLN: INTRAVENOUS | Status: DC | PRN

## 2022-05-24 MED ORDER — LIDOCAINE HCL (CARDIAC) PF 100 MG/5ML IV SOSY
PREFILLED_SYRINGE | INTRAVENOUS | Status: DC | PRN
  Administered 2022-05-24: 40 mg via INTRAVENOUS

## 2022-05-24 MED ORDER — MIDAZOLAM HCL 2 MG/2ML IJ SOLN
INTRAMUSCULAR | Status: AC
  Filled 2022-05-24: qty 2

## 2022-05-24 MED ORDER — SODIUM CHLORIDE 0.9 % IV SOLN
3.0000 g | Freq: Four times a day (QID) | INTRAVENOUS | Status: DC
  Administered 2022-05-24 – 2022-05-26 (×7): 3 g via INTRAVENOUS
  Filled 2022-05-24 (×2): qty 8
  Filled 2022-05-24: qty 3
  Filled 2022-05-24 (×3): qty 8
  Filled 2022-05-24 (×2): qty 3
  Filled 2022-05-24: qty 8

## 2022-05-24 MED ORDER — LIDOCAINE HCL (PF) 2 % IJ SOLN
INTRAMUSCULAR | Status: AC
  Filled 2022-05-24: qty 5

## 2022-05-24 MED ORDER — FENTANYL CITRATE (PF) 100 MCG/2ML IJ SOLN: 25.0000 ug | INTRAMUSCULAR | Status: DC | PRN

## 2022-05-24 MED ORDER — PROPOFOL 10 MG/ML IV BOLUS
INTRAVENOUS | Status: DC | PRN
  Administered 2022-05-24: 20 mg via INTRAVENOUS

## 2022-05-24 MED ORDER — OXYCODONE HCL 5 MG PO TABS: 5.0000 mg | ORAL_TABLET | Freq: Once | ORAL | Status: DC | PRN

## 2022-05-24 MED ORDER — VANCOMYCIN HCL 1 G IV SOLR
INTRAVENOUS | Status: DC | PRN
  Administered 2022-05-24: 1000 mg via TOPICAL

## 2022-05-24 MED ORDER — VANCOMYCIN HCL 1000 MG IV SOLR
INTRAVENOUS | Status: AC
  Filled 2022-05-24: qty 20

## 2022-05-24 MED ORDER — FENTANYL CITRATE (PF) 100 MCG/2ML IJ SOLN
INTRAMUSCULAR | Status: AC
  Filled 2022-05-24: qty 2

## 2022-05-24 MED ORDER — PROPOFOL 10 MG/ML IV BOLUS
INTRAVENOUS | Status: AC
  Filled 2022-05-24: qty 20

## 2022-05-24 MED ORDER — SODIUM CHLORIDE 0.9 % IR SOLN
Status: DC | PRN
  Administered 2022-05-24: 3000 mL

## 2022-05-24 MED ORDER — 0.9 % SODIUM CHLORIDE (POUR BTL) OPTIME
TOPICAL | Status: DC | PRN
  Administered 2022-05-24: 500 mL

## 2022-05-24 MED ORDER — PROPOFOL 500 MG/50ML IV EMUL
INTRAVENOUS | Status: DC | PRN
  Administered 2022-05-24: 50 ug/kg/min via INTRAVENOUS

## 2022-05-24 MED ORDER — OXYCODONE HCL 5 MG/5ML PO SOLN: 5.0000 mg | Freq: Once | ORAL | Status: DC | PRN

## 2022-05-24 SURGICAL SUPPLY — 76 items
APL PRP STRL LF DISP 70% ISPRP (MISCELLANEOUS)
BLADE OSC/SAGITTAL MD 5.5X18 (BLADE) IMPLANT
BLADE OSCILLATING/SAGITTAL (BLADE)
BLADE SURG 15 STRL LF DISP TIS (BLADE) ×2 IMPLANT
BLADE SURG 15 STRL SS (BLADE) ×1
BLADE SW THK.38XMED LNG THN (BLADE) IMPLANT
BNDG CMPR 5X4 CHSV STRCH STRL (GAUZE/BANDAGES/DRESSINGS)
BNDG CMPR 5X6 CHSV STRCH STRL (GAUZE/BANDAGES/DRESSINGS)
BNDG CMPR 75X21 PLY HI ABS (MISCELLANEOUS) ×1
BNDG COHESIVE 4X5 TAN STRL LF (GAUZE/BANDAGES/DRESSINGS) ×2 IMPLANT
BNDG COHESIVE 6X5 TAN ST LF (GAUZE/BANDAGES/DRESSINGS) ×2 IMPLANT
BNDG ELASTIC 4X5.8 VLCR STR LF (GAUZE/BANDAGES/DRESSINGS) ×2 IMPLANT
BNDG ESMARCH 4 X 12 STRL LF (GAUZE/BANDAGES/DRESSINGS)
BNDG ESMARCH 4X12 STRL LF (GAUZE/BANDAGES/DRESSINGS) ×2 IMPLANT
BNDG GAUZE DERMACEA FLUFF 4 (GAUZE/BANDAGES/DRESSINGS) ×2 IMPLANT
BNDG GZE 12X3 1 PLY HI ABS (GAUZE/BANDAGES/DRESSINGS)
BNDG GZE DERMACEA 4 6PLY (GAUZE/BANDAGES/DRESSINGS) ×1
BNDG STRETCH GAUZE 3IN X12FT (GAUZE/BANDAGES/DRESSINGS) ×2 IMPLANT
CANISTER WOUND CARE 500ML ATS (WOUND CARE) ×2 IMPLANT
CHLORAPREP W/TINT 26 (MISCELLANEOUS) ×2 IMPLANT
CUFF TOURN SGL QUICK 12 (TOURNIQUET CUFF) IMPLANT
CUFF TOURN SGL QUICK 18X4 (TOURNIQUET CUFF) IMPLANT
DRAPE FLUOR MINI C-ARM 54X84 (DRAPES) IMPLANT
DRAPE XRAY CASSETTE 23X24 (DRAPES) IMPLANT
DRSG GAUZE FLUFF 36X18 (GAUZE/BANDAGES/DRESSINGS) IMPLANT
DURAPREP 26ML APPLICATOR (WOUND CARE) ×2 IMPLANT
ELECT REM PT RETURN 9FT ADLT (ELECTROSURGICAL) ×1
ELECTRODE REM PT RTRN 9FT ADLT (ELECTROSURGICAL) ×2 IMPLANT
GAUZE PACKING 0.25INX5YD STRL (GAUZE/BANDAGES/DRESSINGS) ×2 IMPLANT
GAUZE PACKING IODOFORM 1/2INX (GAUZE/BANDAGES/DRESSINGS) IMPLANT
GAUZE SPONGE 4X4 12PLY STRL (GAUZE/BANDAGES/DRESSINGS) ×2 IMPLANT
GAUZE STRETCH 2X75IN STRL (MISCELLANEOUS) ×2 IMPLANT
GAUZE XEROFORM 1X8 LF (GAUZE/BANDAGES/DRESSINGS) ×2 IMPLANT
GLOVE PI ORTHO PRO STRL SZ7 (GLOVE) ×2 IMPLANT
GLOVE SURG UNDER POLY LF SZ7.5 (GLOVE) ×2 IMPLANT
GOWN STRL REUS W/ TWL LRG LVL3 (GOWN DISPOSABLE) ×2 IMPLANT
GOWN STRL REUS W/TWL LRG LVL3 (GOWN DISPOSABLE) ×2
GOWN STRL REUS W/TWL MED LVL3 (GOWN DISPOSABLE) ×2 IMPLANT
HANDPIECE VERSAJET DEBRIDEMENT (MISCELLANEOUS) IMPLANT
IV NS 1000ML (IV SOLUTION)
IV NS 1000ML BAXH (IV SOLUTION) ×2 IMPLANT
IV NS IRRIG 3000ML ARTHROMATIC (IV SOLUTION) IMPLANT
KIT DRSG VAC SLVR GRANUFM (MISCELLANEOUS) ×2 IMPLANT
KIT TURNOVER KIT A (KITS) ×2 IMPLANT
LABEL OR SOLS (LABEL) ×2 IMPLANT
MANIFOLD NEPTUNE II (INSTRUMENTS) ×2 IMPLANT
NDL FILTER BLUNT 18X1 1/2 (NEEDLE) ×2 IMPLANT
NDL HYPO 25X1 1.5 SAFETY (NEEDLE) ×4 IMPLANT
NEEDLE FILTER BLUNT 18X1 1/2 (NEEDLE) ×1 IMPLANT
NEEDLE HYPO 25X1 1.5 SAFETY (NEEDLE) ×2 IMPLANT
NS IRRIG 500ML POUR BTL (IV SOLUTION) ×2 IMPLANT
PACK EXTREMITY ARMC (MISCELLANEOUS) ×2 IMPLANT
PACKING GAUZE IODOFORM 1INX5YD (GAUZE/BANDAGES/DRESSINGS) ×2 IMPLANT
PAD ABD DERMACEA PRESS 5X9 (GAUZE/BANDAGES/DRESSINGS) ×2 IMPLANT
PULSAVAC PLUS IRRIG FAN TIP (DISPOSABLE) ×1
RASP SM TEAR CROSS CUT (RASP) IMPLANT
SHIELD FULL FACE ANTIFOG 7M (MISCELLANEOUS) ×2 IMPLANT
SOL PREP PVP 2OZ (MISCELLANEOUS) ×1
SOLUTION PREP PVP 2OZ (MISCELLANEOUS) ×2 IMPLANT
SPONGE T-LAP 18X18 ~~LOC~~+RFID (SPONGE) IMPLANT
STAPLER SKIN PROX 35W (STAPLE) IMPLANT
STOCKINETTE IMPERVIOUS 9X36 MD (GAUZE/BANDAGES/DRESSINGS) ×2 IMPLANT
SUT ETHILON 2 0 FS 18 (SUTURE) ×4 IMPLANT
SUT ETHILON 4-0 (SUTURE)
SUT ETHILON 4-0 FS2 18XMFL BLK (SUTURE)
SUT VIC AB 3-0 SH 27 (SUTURE) ×1
SUT VIC AB 3-0 SH 27X BRD (SUTURE) ×2 IMPLANT
SUT VIC AB 4-0 FS2 27 (SUTURE) ×2 IMPLANT
SUTURE ETHLN 4-0 FS2 18XMF BLK (SUTURE) ×2 IMPLANT
SWAB CULTURE AMIES ANAERIB BLU (MISCELLANEOUS) IMPLANT
SYR 10ML LL (SYRINGE) ×2 IMPLANT
SYR 3ML LL SCALE MARK (SYRINGE) ×2 IMPLANT
TIP FAN IRRIG PULSAVAC PLUS (DISPOSABLE) ×2 IMPLANT
TRAP FLUID SMOKE EVACUATOR (MISCELLANEOUS) ×2 IMPLANT
UNDERPAD 30X36 HEAVY ABSORB (UNDERPADS AND DIAPERS) ×2 IMPLANT
WATER STERILE IRR 500ML POUR (IV SOLUTION) ×2 IMPLANT

## 2022-05-24 NOTE — H&P (Signed)
History and Physical Interval Note:  05/24/2022 5:39 PM  [name] [name]  has presented today for surgery, with the diagnosis of right foot infection.  The various methods of treatment have been discussed with the patient and family. After consideration of risks, benefits and other options for treatment, the patient has consented to   Procedure(s): RIGHT REVISION INCISION AND DRAINAGE/ WASHOUT WITH PRIMARY DELAYED CLOSURE (Right) as a surgical intervention.  The patient's history has been reviewed, patient examined, no change in status, stable for surgery.  I have reviewed the patient's chart and labs.  Questions were answered to the patient's satisfaction.     [name]

## 2022-05-24 NOTE — Progress Notes (Signed)
   05/24/22 1000  Spiritual Encounters  Type of Visit Initial  Care provided to: Patient  Referral source Nurse (RN/NT/LPN)  Reason for visit Urgent spiritual support  OnCall Visit Yes  Spiritual Framework  Patient Stress Factors None identified  Family Stress Factors None identified  Spiritual Care Plan  Spiritual Care Issues Still Outstanding Chaplain will continue to follow   Give the Patient an AD patient will look over it and if need be contact Chaplains off if they have any questions.

## 2022-05-24 NOTE — Consult Note (Signed)
[facility] for Infectious Disease    Date of Admission:  05/21/2022     Reason for Consult: Foot infection     Referring Physician: Dr Billie Ruddy  Current antibiotics: Vancomycin Meropenem  ASSESSMENT:    57 y.o. 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  No history of diabetes and previous ABI's were normal.  Status post OR 05/22/22 where cultures are growing Staph aureus and findings were not consistent with osteomyelitis.  Osteomyelitis also not obviously appreciated on imaging.  She is going back to the OR today for repeat washout and closure. Septic shock:  Sepsis physiology improved and no longer requiring pressors. Penicillin allergy:  Described as non-specific rash as a child.  Tolerated Keflex in the past. Morbid obesity: Body mass index is 40.91 kg/m.   RECOMMENDATIONS:    Stop meropenem Start Unasyn Stop vancomycin as she has exclusively grown MSSA in the past Follow up OR cultures and findings from the OR today Follow up blood cultures Lab monitoring Wound care Will follow   Principal Problem:   Cellulitis of right foot Active Problems:   GERD (gastroesophageal reflux disease)   Essential hypertension   Severe sepsis ([name])   AMS (altered mental status)   Septic shock ([name])   Abscess of right foot   MEDICATIONS:    Scheduled Meds:  aspirin  81 mg Oral BID   busPIRone  15 mg Oral TID   Chlorhexidine Gluconate Cloth  6 each Topical Q0600   DULoxetine  20 mg Oral Daily   enoxaparin (LOVENOX) injection  0.5 mg/kg Subcutaneous Q24H   escitalopram  10 mg Oral Daily   estradiol  1 mg Oral QHS   insulin aspart  1-3 Units Subcutaneous Q4H   lamoTRIgine  100 mg Oral Daily   pregabalin  100 mg Oral TID   sodium chloride flush  3 mL Intravenous Q12H   topiramate  50 mg Oral BID   Continuous Infusions:  sodium chloride 10 mL/hr at 05/23/22 0906   ampicillin-sulbactam (UNASYN) IV      vancomycin 1,500 mg (05/24/22 0355)   PRN Meds:.acetaminophen **OR** acetaminophen, albuterol, ondansetron (ZOFRAN) IV, mouth rinse, oxyCODONE-acetaminophen  HPI:    [name] [name] is a 57 y.o. female with PMHx as noted below who presented to [facility] on [facility]/22/22 with sepsis secondary to a right foot infection.  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  She has required return trips to the OR for debridement in July 2022 after an MRI showed abscess.  Upon this admission she was found to be hypotensive requiring briefly being on pressors.  She was started on broad spectrum antibiotics.  CT with contrast showed abscess but no definitive findings of OM.  She was taken to the OR on 05/22/22 with Dr Posey Pronto.  Operative note reviewed reporting purulent drainage did not appear near the prior amputation site bone.  She will return to the OR for revisional I&D and delayed primary closure today with podiatry.  Her operative cultures are growing Staph aureus with pending sensitivities.  She has a wound culture from Nov 2023 that grew MSSA as well as Mar 2023, Nov 2021, Jan 2021, Oct 2019.  She reports her penicillin allergy is a rash that she thinks happened as a child.  She reports no history of anaphylaxis or SOB.  She is agreeable to trial of penicillin as needed and has tolerated Keflex  in the past.    Past Medical History:  Diagnosis Date   Anxiety    Arthritis    Phreesia 02/08/2020   Asthma    mild intermittent   Asthma    Phreesia 02/08/2020   Bipolar 1 disorder ([place])    ect treatments last treatment Sep 02 1011   Depression    Depression    Phreesia 02/08/2020   Depression    Phreesia 07/10/2020   GERD (gastroesophageal reflux disease)    Headache    Hypertension    Pre-diabetes    Seizures ([place])    Thinks it was from taking Tramadol   Sleep apnea    wears CPAP, uncertain of setting   Substance abuse ([place])    Phreesia 02/08/2020    Social History   Tobacco Use   Smoking  status: Never   Smokeless tobacco: Never  Vaping Use   Vaping Use: Never used  Substance Use Topics   Alcohol use: No   Drug use: No    Family History  Problem Relation Age of Onset   Diabetes Mother    COPD Mother    Hypertension Mother    Hyperlipidemia Mother    Sleep apnea Mother    Heart disease Father    Hyperlipidemia Father    Hypertension Father    Cancer Father    Sleep apnea Father    Heart disease Brother    Sleep apnea Brother    Heart disease Maternal Grandmother    Hypertension Maternal Grandmother    Sleep apnea Maternal Grandmother    Heart disease Maternal Grandfather    Kidney cancer Paternal Grandmother    Heart disease Paternal Grandfather    Heart disease Daughter     Allergies  Allergen Reactions   Other    Penicillins Hives and Rash    No anaphylactic or severe cutaneous Sx > 10 yr ago, no additional medical attention required Tolerates Keflex and Rocephin   Tetracyclines & Related Other (See Comments)    Syncope and put her "in a coma"   Tramadol Other (See Comments)    Seizures   Phenazopyridine Other (See Comments)    Unknown   Tetracycline Other (See Comments)    Syncope and "Put me in a coma"   Ciprofloxacin Rash and Itching   Codeine Itching and Rash   Estradiol Rash    Patches broke out the skin    Review of Systems  All other systems reviewed and are negative.  Except as noted above.   OBJECTIVE:   Blood pressure 123/77, pulse (!) 59, temperature 97.7 F (36.5 C), resp. rate 17, height '5\' 4"'$  (1.626 m), weight 108.1 kg, SpO2 99 %. Body mass index is 40.91 kg/m.  Physical Exam Constitutional:      General: She is not in acute distress. HENT:     Head: Normocephalic and atraumatic.  Eyes:     Extraocular Movements: Extraocular movements intact.     Conjunctiva/sclera: Conjunctivae normal.  Cardiovascular:     Rate and Rhythm: Normal rate and regular rhythm.  Pulmonary:     Effort: Pulmonary effort is normal. No  respiratory distress.  Abdominal:     General: There is no distension.     Palpations: Abdomen is soft.  Musculoskeletal:     Cervical back: Normal range of motion and neck supple.     Comments: Right lower foot s/p TMA and wrapped in clean gauze.   Skin:    General: Skin is warm and  dry.     Findings: No rash.  Neurological:     General: No focal deficit present.     Mental Status: She is alert and oriented to person, place, and time.  Psychiatric:        Mood and Affect: Mood normal.        Behavior: Behavior normal.      Lab Results: Lab Results  Component Value Date   WBC 17.4 (H) 05/24/2022   HGB 9.6 (L) 05/24/2022   HCT 28.7 (L) 05/24/2022   MCV 95.3 05/24/2022   PLT 263 05/24/2022    Lab Results  Component Value Date   NA 136 05/24/2022   K 3.7 05/24/2022   CO2 20 (L) 05/24/2022   GLUCOSE 109 (H) 05/24/2022   BUN 16 05/24/2022   CREATININE 0.62 05/24/2022   CALCIUM 8.5 (L) 05/24/2022   GFRNONAA >60 05/24/2022   GFRAA 119 06/27/2020    Lab Results  Component Value Date   ALT 10 05/22/2022   AST 18 05/22/2022   ALKPHOS 71 05/22/2022   BILITOT 0.7 05/22/2022       Component Value Date/Time   CRP 2.1 (H) 03/15/2022 2002       Component Value Date/Time   ESRSEDRATE 23 (H) 03/15/2022 2002    I have reviewed the micro and lab results in Epic.  Imaging: CT FOOT RIGHT W CONTRAST  Result Date: 05/22/2022 CLINICAL DATA:  Foot infection EXAM: CT OF THE LOWER RIGHT EXTREMITY WITH CONTRAST TECHNIQUE: Multidetector CT imaging of the lower right extremity was performed according to the standard protocol following intravenous contrast administration. RADIATION DOSE REDUCTION: This exam was performed according to the departmental dose-optimization program which includes automated exposure control, adjustment of the mA and/or kV according to patient size and/or use of iterative reconstruction technique. CONTRAST:  148m OMNIPAQUE IOHEXOL 300 MG/ML  SOLN COMPARISON:   05/22/2022 radiographs FINDINGS: Bones/Joint/Cartilage Prior transmetatarsal amputation. No bony destructive findings, substantial erosions, or periosteal reaction to indicate active osteomyelitis. Plantar and Achilles calcaneal spurs with the plantar calcaneal spur chronically fragmented. Ligaments Suboptimally assessed by CT. Muscles and Tendons Thickening of the plantar fascia likely from chronic fasciitis. Soft tissues Hypodensity with marginal enhancement along the distal stump region medially, measuring about 4.8 by 2.4 by 4.3 cm (volume = 26 cm^3), favoring abscess. This also has some finger-like projections and margins abutting the distal amputated margins of the first and second metatarsals. Subcutaneous edema dorsally along the ankle and circumferentially along the distal stump region. IMPRESSION: 1. Approximally 26 cc fluid collection with marginal enhancement along the medial distal stump region, favoring abscess. This abuts the anterior margins of the amputated first and second metatarsals but no bony destructive findings are identified to specifically indicate osteomyelitis. 2. Thickening of the plantar fascia likely from chronic fasciitis. 3. Plantar and Achilles calcaneal spurs. 4. Subcutaneous edema dorsally along the ankle and circumferentially along the distal stump region. Electronically Signed   By: WVan ClinesM.D.   On: 05/22/2022 13:59     Imaging independently reviewed in Epic.  ARaynelle Highlandfor Infectious Disease [facility] [phone] 05/24/2022, 12:06 PM

## 2022-05-24 NOTE — Progress Notes (Signed)
  PROGRESS NOTE    [name] [name]  JQG:[id number] DOB: 04/06/1966 DOA: 05/21/2022 PCP: [name], MD  ARPO/None  LOS: 2 days   Brief hospital course:   Assessment & Plan: [name] [name] is 57 year old female with history of bipolar disorder, total arthroplasty right shoulder December 2023, chronic pain syndrome, anxiety depression, recent right toe amputation who presented to emergency room with altered mental status and redness of the right foot amputation stump.   Septic shock  altered mental status, fever 101.2, tachycardia, elevated white count of 20,000 and cellulitis and abscess of right amputation stump.  Hypotensive despite IVF per sepsis protocol and had to be on pressor levo briefly.  right foot amputation stump infection with abscess and osteomyelitis -- patient recently was started outpatient by podiatry on PO Keflex.  --started on vanc/meropenem and clinda --received OR I/D on 05/22/22 Plan: --ID consult today --de-escalate abx to Unasyn today, per ID --revision I/D later today   Depression anxiety bipolar disorder history of seizure disorder --cont Buspar, Cymbalta and Lexapro  --cont lamotrigine and Topamax   Chronic pain syndrome recent right shoulder arthroplasty --cont home Percocet and Lyrica   Morbid obesity with history of sleep apnea --CPAP nightly    DVT prophylaxis: Lovenox SQ Code Status: Full code  Family Communication:  Level of care: Med-Surg Dispo:   The patient is from: home Anticipated d/c is to: to be determined Anticipated d/c date is: to be detemrined   Subjective and Interval History:  No new complaint today.    Going for revision I/D later today.   Objective: Vitals:   05/24/22 1855 05/24/22 1900 05/24/22 1905 05/24/22 1910  BP:  114/68  116/65  Pulse: 76 73 77 77  Resp: '14 15 12 13  '$ Temp:  (!) 96.9 F (36.1 C)    TempSrc:      SpO2: 97% 96% 96% 96%  Weight:      Height:        Intake/Output Summary (Last  24 hours) at 05/24/2022 1918 Last data filed at 05/24/2022 1831 Gross per 24 hour  Intake 725.18 ml  Output 1855 ml  Net -1129.82 ml   Filed Weights   05/21/22 2344 05/22/22 1221 05/24/22 1637  Weight: 107.4 kg 108.1 kg 108 kg    Examination:   Constitutional: NAD, AAOx3 HEENT: conjunctivae and lids normal, EOMI CV: No cyanosis.   RESP: normal respiratory effort, on RA Neuro: II - XII grossly intact.   Psych: Normal mood and affect.  Appropriate judgement and reason   Data Reviewed: I have personally reviewed labs and imaging studies  Time spent: 35 minutes  Enzo Bi, MD [facility] If 7PM-7AM, please contact night-coverage 05/24/2022, 7:18 PM

## 2022-05-24 NOTE — Anesthesia Preprocedure Evaluation (Signed)
Anesthesia Evaluation  Patient identified by MRN, date of birth, ID band Patient awake    Reviewed: Allergy & Precautions, NPO status , Patient's Chart, lab work & pertinent test results  History of Anesthesia Complications Negative for: history of anesthetic complications  Airway Mallampati: III  TM Distance: <3 FB Neck ROM: full    Dental  (+) Chipped   Pulmonary asthma , sleep apnea    Pulmonary exam normal        Cardiovascular Exercise Tolerance: Good hypertension, (-) angina negative cardio ROS Normal cardiovascular exam     Neuro/Psych  Headaches, Seizures -,  PSYCHIATRIC DISORDERS         GI/Hepatic Neg liver ROS,GERD  Controlled,,  Endo/Other  negative endocrine ROS    Renal/GU Renal disease  negative genitourinary   Musculoskeletal   Abdominal   Peds  Hematology negative hematology ROS (+)   Anesthesia Other Findings Past Medical History: No date: Anxiety No date: Arthritis     Comment:  Phreesia 02/08/2020 No date: Asthma     Comment:  mild intermittent No date: Asthma     Comment:  Phreesia 02/08/2020 No date: Bipolar 1 disorder ([place])     Comment:  ect treatments last treatment Sep 02 1011 No date: Depression No date: Depression     Comment:  Phreesia 02/08/2020 No date: Depression     Comment:  Phreesia 07/10/2020 No date: GERD (gastroesophageal reflux disease) No date: Headache No date: Hypertension No date: Pre-diabetes No date: Seizures ([place])     Comment:  Thinks it was from taking Tramadol No date: Sleep apnea     Comment:  wears CPAP, uncertain of setting No date: Substance abuse [facility])     Comment:  Phreesia 02/08/2020  Past Surgical History: No date: ABDOMINAL HYSTERECTOMY 02/02/2018: AMPUTATION TOE; Left     Comment:  Procedure: AMPUTATION TOE Left 4th toe;  Surgeon:               [name], DPM;  Location: [facility];  Service:               Podiatry;  Laterality:  Left; No date: APPENDECTOMY; N/A     Comment:  Phreesia 02/08/2020 2018: BACK SURGERY     Comment:  ACDF   C5-6 & C6-7 by Dr. Rennis Harding No date: Nina:  x2 05/22/2022: I & D EXTREMITY; Right     Comment:  Procedure: IRRIGATION AND DEBRIDEMENT EXTREMITY;                Surgeon: Felipa Furnace, DPM;  Location: [facility];                Service: Podiatry;  Laterality: Right; 10/25/2020: IRRIGATION AND DEBRIDEMENT ABSCESS; Right     Comment:  Procedure: IRRIGATION AND DEBRIDEMENT ABSCESS OF FOOT               AND APPLICATION OF GRAFT;  Surgeon: [name],               DPM;  Location: [facility];  Service: Podiatry;  Laterality:               Right; No date: Laproscopic knee surgery; Right 03/15/2017: NECK SURGERY     Comment:  ACDF   by Dr. Patrice Paradise No date: PLANTAR FASCIA RELEASE     Comment:  x2 04/14/2022: REVERSE SHOULDER ARTHROPLASTY; Right     Comment:  Procedure: REVERSE SHOULDER ARTHROPLASTY;  Surgeon:               Hiram Gash, MD;  Location: [facility];  Service:               Orthopedics;  Laterality: Right; 05/18/2019: TRANSMETATARSAL AMPUTATION; Right     Comment:  Procedure: TRANSMETATARSAL AMPUTATION;  Surgeon:               [name], DPM;  Location: [facility];  Service:               Podiatry;  Laterality: Right;  BMI    Body Mass Index: 40.87 kg/m      Reproductive/Obstetrics negative OB ROS                             Anesthesia Physical Anesthesia Plan  ASA: 3  Anesthesia Plan: General   Post-op Pain Management:    Induction: Intravenous  PONV Risk Score and Plan: Propofol infusion and TIVA  Airway Management Planned: Natural Airway and Nasal Cannula  Additional Equipment:   Intra-op Plan:   Post-operative Plan:   Informed Consent: I have reviewed the patients History and Physical, chart, labs and discussed the procedure including the risks, benefits and alternatives for the proposed  anesthesia with the patient or authorized representative who has indicated his/her understanding and acceptance.     Dental Advisory Given  Plan Discussed with: Anesthesiologist, CRNA and Surgeon  Anesthesia Plan Comments: (Patient consented for risks of anesthesia including but not limited to:  - adverse reactions to medications - risk of airway placement if required - damage to eyes, teeth, lips or other oral mucosa - nerve damage due to positioning  - sore throat or hoarseness - Damage to heart, brain, nerves, lungs, other parts of body or loss of life  Patient voiced understanding.)       Anesthesia Quick Evaluation

## 2022-05-24 NOTE — Consult Note (Signed)
Pharmacy Antibiotic Note  [name] [name] is a 57 y.o. female admitted on 05/21/2022 with AMS and right foot stump pain and redness. 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 Taking PO cephalexin PTA and recent Rx for PO bactrim 05/20/22 (unsure if started) Pharmacy has been consulted for vancomycin dosing.   -ID consulted -Meropenem changed to Unasyn ( PCN allergy does not appear significant (unk reaction as child).  Tolerated keflex  Plan: Continue Vancomycin 1500 mg Q24H. Goal AUC 400-550 Estimated AUC 476/Cmin: 10 Scr 0.81, IBW, Vd 0.5 (BMI 40)    Height: '5\' 4"'$  (162.6 cm) Weight: 108.1 kg (238 lb 5.1 oz) IBW/kg (Calculated) : 54.7  Temp (24hrs), Avg:98 F (36.7 C), Min:97.7 F (36.5 C), Max:98.3 F (36.8 C)  Recent Labs  Lab 05/21/22 2339 05/22/22 0203 05/22/22 0520 05/22/22 1301 05/23/22 0929 05/24/22 0231  WBC 19.0* 18.7* 20.3*  --  19.9* 17.4*  CREATININE 0.81 0.79  --   --  0.56 0.62  LATICACIDVEN 2.2* 2.3*  --  1.2  --   --      Estimated Creatinine Clearance: 92.1 mL/min (by C-G formula based on SCr of 0.62 mg/dL).    Allergies  Allergen Reactions   Other    Penicillins Hives and Rash    No anaphylactic or severe cutaneous Sx > 10 yr ago, no additional medical attention required Tolerates Keflex and Rocephin   Tetracyclines & Related Other (See Comments)    Syncope and put her "in a coma"   Tramadol Other (See Comments)    Seizures   Phenazopyridine Other (See Comments)    Unknown   Tetracycline Other (See Comments)    Syncope and "Put me in a coma"   Ciprofloxacin Rash and Itching   Codeine Itching and Rash   Estradiol Rash    Patches broke out the skin    Antimicrobials this admission: 2/5 Unasyn >> 2/3 vancomycin >>  2/3 Meropenem >>2/5 2/3 ceftriaxone x 1 dose   Dose adjustments this admission:   Microbiology results: 2/2 BCx: NGTD 2/3: R foot abscess cx: mod Staph aureus 2/3 MRAS PCR  neg  Thank you for allowing pharmacy to be a part of this patient's care.  Noralee Space, PharmD Clinical Pharmacist   05/24/2022 12:24 PM

## 2022-05-24 NOTE — Brief Op Note (Signed)
05/24/2022  6:23 PM  PATIENT:  [name]  57 y.o. female  PRE-OPERATIVE DIAGNOSIS:  Right foot infection  POST-OPERATIVE DIAGNOSIS:  Right foot infection  PROCEDURE:  Procedure(s): RIGHT REVISION INCISION AND DRAINAGE/ WASHOUT WITH PRIMARY DELAYED CLOSURE (Right)  SURGEON:  Surgeon(s) and Role:    * Elodie Panameno, Stephan Minister, DPM - Primary  PHYSICIAN ASSISTANT:   ASSISTANTS: none   ANESTHESIA:   MAC  EBL:  20cc   BLOOD ADMINISTERED:none  DRAINS: none   LOCAL MEDICATIONS USED:  MARCAINE    and Amount: 12 ml  SPECIMEN:  none  DISPOSITION OF SPECIMEN:  N/A  COUNTS:  YES  TOURNIQUET:  * No tourniquets in log *  DICTATION: .Note written in EPIC  PLAN OF CARE: Admit to inpatient   PATIENT DISPOSITION:  PACU - hemodynamically stable.   Delay start of Pharmacological VTE agent (>24hrs) due to surgical blood loss or risk of bleeding: no   Still some necrosis and purulence in plantar flap, did not close and packed open. Will need return to OR Wed or Thurs for washout, I expect likely revision of the metatarsal parabola as well

## 2022-05-24 NOTE — Transfer of Care (Signed)
Immediate Anesthesia Transfer of Care Note  Patient: [name] [name]  Procedure(s) Performed: RIGHT REVISION INCISION AND DRAINAGE/ WASHOUT WITH PRIMARY DELAYED CLOSURE (Right)  Patient Location: PACU  Anesthesia Type:General  Level of Consciousness: awake, drowsy, and patient cooperative  Airway & Oxygen Therapy: Patient Spontanous Breathing  Post-op Assessment: Report given to RN and Post -op Vital signs reviewed and stable  Post vital signs: Reviewed and stable  Last Vitals:  Vitals Value Taken Time  BP 108/62 05/24/22 1830  Temp    Pulse 67 05/24/22 1831  Resp 15 05/24/22 1831  SpO2 95 % 05/24/22 1831  Vitals shown include unvalidated device data.  Last Pain:  Vitals:   05/24/22 1637  TempSrc: Temporal  PainSc: 0-No pain      Patients Stated Pain Goal: 0 (09/04/31 5825)  Complications: No notable events documented.

## 2022-05-24 NOTE — Progress Notes (Signed)
PT Cancellation Note  Patient Details Name: [name] [name] MRN: [id number] DOB: 26-Jan-1966   Cancelled Treatment:    Reason Eval/Treat Not Completed: Patient declined, no reason specified (Further surgery pending for this evening. Patient is requesting to initiate therapy tomorrow.)  Minna Merritts, PT, MPT  Percell Locus 05/24/2022, 2:06 PM

## 2022-05-24 NOTE — Anesthesia Procedure Notes (Signed)
Procedure Name: MAC Date/Time: 05/24/2022 5:45 PM  Performed by: Jerrye Noble, CRNAPre-anesthesia Checklist: Patient identified, Emergency Drugs available, Suction available and Patient being monitored Patient Re-evaluated:Patient Re-evaluated prior to induction Oxygen Delivery Method: Nasal cannula

## 2022-05-25 ENCOUNTER — Encounter: Payer: Self-pay | Admitting: Podiatry

## 2022-05-25 DIAGNOSIS — L03115 Cellulitis of right lower limb: Secondary | ICD-10-CM | POA: Diagnosis not present

## 2022-05-25 LAB — CBC
HCT: 30.6 % — ABNORMAL LOW (ref 36.0–46.0)
Hemoglobin: 10 g/dL — ABNORMAL LOW (ref 12.0–15.0)
MCH: 31.3 pg (ref 26.0–34.0)
MCHC: 32.7 g/dL (ref 30.0–36.0)
MCV: 95.9 fL (ref 80.0–100.0)
Platelets: 254 10*3/uL (ref 150–400)
RBC: 3.19 MIL/uL — ABNORMAL LOW (ref 3.87–5.11)
RDW: 14.2 % (ref 11.5–15.5)
WBC: 11.3 10*3/uL — ABNORMAL HIGH (ref 4.0–10.5)
nRBC: 0 % (ref 0.0–0.2)

## 2022-05-25 LAB — BASIC METABOLIC PANEL
Anion gap: 5 (ref 5–15)
BUN: 12 mg/dL (ref 6–20)
CO2: 26 mmol/L (ref 22–32)
Calcium: 8.3 mg/dL — ABNORMAL LOW (ref 8.9–10.3)
Chloride: 103 mmol/L (ref 98–111)
Creatinine, Ser: 0.69 mg/dL (ref 0.44–1.00)
GFR, Estimated: >60 mL/min (ref >60)
Glucose, Bld: 88 mg/dL (ref 70–99)
Potassium: 3.7 mmol/L (ref 3.5–5.1)
Sodium: 134 mmol/L — ABNORMAL LOW (ref 135–145)

## 2022-05-25 LAB — GLUCOSE, CAPILLARY
Glucose-Capillary: 84 mg/dL (ref 70–99)
Glucose-Capillary: 86 mg/dL (ref 70–99)
Glucose-Capillary: 90 mg/dL (ref 70–99)
Glucose-Capillary: 85 mg/dL (ref 70–99)
Glucose-Capillary: 98 mg/dL (ref 70–99)

## 2022-05-25 LAB — MAGNESIUM: Magnesium: 2 mg/dL (ref 1.7–2.4)

## 2022-05-25 MED ORDER — VANCOMYCIN HCL 1500 MG/300ML IV SOLN
1500.0000 mg | INTRAVENOUS | Status: DC
  Administered 2022-05-25 – 2022-05-27 (×3): 1500 mg via INTRAVENOUS
  Filled 2022-05-25 (×4): qty 300

## 2022-05-25 MED ORDER — NALOXEGOL OXALATE 25 MG PO TABS
25.0000 mg | ORAL_TABLET | Freq: Every day | ORAL | Status: DC
  Administered 2022-05-25 – 2022-06-07 (×14): 25 mg via ORAL
  Filled 2022-05-25 (×15): qty 1

## 2022-05-25 NOTE — Plan of Care (Signed)
  Problem: Education: Goal: Knowledge of General Education information will improve Description: Including pain rating scale, medication(s)/side effects and non-pharmacologic comfort measures Outcome: Progressing   Problem: Activity: Goal: Risk for activity intolerance will decrease Outcome: Progressing   Problem: Nutrition: Goal: Adequate nutrition will be maintained Outcome: Progressing   Problem: Coping: Goal: Level of anxiety will decrease Outcome: Progressing   Problem: Elimination: Goal: Will not experience complications related to bowel motility Outcome: Progressing Goal: Will not experience complications related to urinary retention Outcome: Progressing   Problem: Pain Managment: Goal: General experience of comfort will improve Outcome: Progressing

## 2022-05-25 NOTE — Consult Note (Signed)
Pharmacy Antibiotic Note  [name] [name] is a 57 y.o. female admitted on 05/21/2022 with AMS and right foot stump pain and redness. 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 Taking PO cephalexin PTA and recent Rx for PO bactrim 05/20/22 (unsure if started) Pharmacy has been consulted for vancomycin dosing.   - vancomycin stopped 2/5 as previous culture with MSSA, current culture with MRSA so resume vancomycin. Last dose was 2/5 at 0400  Today, 05/25/2022 Day #4 antibiotics Renal: SCr stable and WNL WBC improving Afebrile OR 2/3 for I&D and 2/5 repeat I&D (wound remains open) Does not appear to be bone involvement per Podiatry 2/3 OR culture: MRSA - susc to tetracycline (allergy listed) and TMP/SMZ.  Final culture pending  Plan: Resume Vancomycin 1500 mg Q24H. Goal AUC 400-600 Estimated AUC 468/Cmin: 10 Scr 0.8, IBW, Vd 0.5 (BMI 40) Monitor renal function and need for levels Follow wound cultures   Height: '5\' 4"'$  (162.6 cm) Weight: 108 kg (238 lb 1.6 oz) IBW/kg (Calculated) : 54.7  Temp (24hrs), Avg:98.4 F (36.9 C), Min:96.9 F (36.1 C), Max:99.7 F (37.6 C)  Recent Labs  Lab 05/21/22 2339 05/22/22 0203 05/22/22 0520 05/22/22 1301 05/23/22 0929 05/24/22 0231 05/25/22 0256  WBC 19.0* 18.7* 20.3*  --  19.9* 17.4* 11.3*  CREATININE 0.81 0.79  --   --  0.56 0.62 0.69  LATICACIDVEN 2.2* 2.3*  --  1.2  --   --   --      Estimated Creatinine Clearance: 92 mL/min (by C-G formula based on SCr of 0.69 mg/dL).    Allergies  Allergen Reactions   Other    Penicillins Hives and Rash    No anaphylactic or severe cutaneous Sx > 10 yr ago, no additional medical attention required Tolerates Keflex and Rocephin   Tetracyclines & Related Other (See Comments)    Syncope and put her "in a coma"   Tramadol Other (See Comments)    Seizures   Phenazopyridine Other (See Comments)    Unknown   Tetracycline Other (See Comments)    Syncope and "Put  me in a coma"   Ciprofloxacin Rash and Itching   Codeine Itching and Rash   Estradiol Rash    Patches broke out the skin    Antimicrobials this admission: 2/5 Unasyn >> 2/3 vancomycin >>  2/3 Meropenem >>2/5 2/3 ceftriaxone x 1 dose   Dose adjustments this admission:   Microbiology results: 2/2 BCx: NGTD 2/3: R foot abscess cx: mod Staph aureus 2/3 MRAS PCR neg  Thank you for allowing pharmacy to be a part of this patient's care.  Doreene Eland, PharmD, BCPS, BCIDP Work Cell: [phone] 05/25/2022 9:07 AM

## 2022-05-25 NOTE — Op Note (Signed)
Patient Name: [name] [name] DOB: 30-Sep-1965  MRN: [id number]   Date of Service: 05/24/22  Surgeon: Dr. Lanae Crumbly, DPM Assistants: None Pre-operative Diagnosis:  Abscess of right foot Post-operative Diagnosis:  Abscess of right foot Procedures:  1) incision and drainage of multiple fascial planes right foot Pathology/Specimens: No specimens taken Anesthesia: MAC with local Hemostasis: * No tourniquets in log * Estimated Blood Loss: 5 mL Materials: * No implants in log * Medications: 12 cc 0.5% Marcaine plain Complications: No complication noted  Indications for Procedure:  This is a 57 y.o. female with a history of previous transmetatarsal amputation she developed abscess from plantar ulceration recently.  She underwent I&D on 05/22/2022, she returns to the OR today for further washout and possible closure   Procedure in Detail: Patient was identified in pre-operative holding area. Formal consent was signed and the right lower extremity was marked. Patient was brought back to the operating room. Anesthesia was induced. The extremity was prepped and draped in the usual sterile fashion. Timeout was taken to confirm patient name, laterality, and procedure prior to incision.   Attention was then directed to the right foot which exhibited the open surgical wound and plantar neuropathic ulceration.  I began by exploring the wound this revealed persistent purulent drainage in the plantar vault as well as the dorsal soft tissues above and below the deep fascia.  Full-thickness excisional debridement of the necrotic areas was completed sharply with a scalpel and curette.  I used blunt dissection to explore along the plantar vault above below the deep fascia and a counterincision was made in the plantar medial arch, dorsal midfoot and lateral midfoot.  Once these were completed all purulent drainage was expressed through this, it was only a small amount approximately 5 cc.  Still did not appear to  extend to the bony margins.  I then thoroughly debrided all areas of the foot with 3 cc of normal saline with a pulse irrigator.  Due to the persistent infection and leukocytosis I elected not to close her incision at this point.  It was packed open with iodoform gauze, 1 g of vancomycin powder and a retention suture of 2-0 nylon  The foot was then dressed with ABD dry sterile dressings and an Ace wrap. Patient tolerated the procedure well.   Disposition: Following a period of post-operative monitoring, patient will be transferred to the floor.  She will need to return to the OR in 2 to 3 days for further washout and closure likely revision of the transmetatarsal parabola in order to reapproximate the wound edges.

## 2022-05-25 NOTE — Anesthesia Postprocedure Evaluation (Signed)
Anesthesia Post Note  Patient: SKYRAH KRUPP  Procedure(s) Performed: RIGHT REVISION INCISION AND DRAINAGE/ WASHOUT WITH PRIMARY DELAYED CLOSURE (Right)  Patient location during evaluation: PACU Anesthesia Type: General Level of consciousness: awake and alert Pain management: pain level controlled Vital Signs Assessment: post-procedure vital signs reviewed and stable Respiratory status: spontaneous breathing, nonlabored ventilation, respiratory function stable and patient connected to nasal cannula oxygen Cardiovascular status: blood pressure returned to baseline and stable Postop Assessment: no apparent nausea or vomiting Anesthetic complications: no   No notable events documented.   Last Vitals:  Vitals:   05/24/22 2148 05/25/22 0016  BP: 114/68 113/67  Pulse: 70 65  Resp: 18 16  Temp: 37.1 C 37.6 C  SpO2: 98% 98%    Last Pain:  Vitals:   05/25/22 0016  TempSrc: Oral  PainSc: 5                  Precious Haws Rian Busche

## 2022-05-25 NOTE — Progress Notes (Signed)
  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   Septic shock  altered mental status, fever 101.2, tachycardia, elevated white count of 20,000 and cellulitis and abscess of right amputation stump.  Hypotensive despite IVF per sepsis protocol and had to be on pressor levo briefly.  Right foot cellulitis and abscess at site of prior TMA with history of prior ulceration -- patient recently was started outpatient by podiatry on PO Keflex.  --started on vanc/meropenem and clinda, de-escalated to IV vanc and Unasyn per ID. --received OR I/D on 05/22/22 (OR cx pos MRSA) --revision I/D on 05/24/22. Plan: -NWB surgical extremity  -Will plan for return to the OR on 05/27/2022 for repeat washout, revisional amputation and resection of necrotic bone and soft tissue and primary closure of the amputation site.  --cont IV vanc and Unasyn   Depression anxiety bipolar disorder history of seizure disorder --cont Buspar, Cymbalta and Lexapro  --cont lamotrigine and Topamax   Chronic pain syndrome recent right shoulder arthroplasty --cont home Percocet and Lyrica --Per PT discussion with Dr Griffin Basil, FWBing through R UE on the walker   Morbid obesity, BMI 40  Sleep apnea --CPAP nightly    DVT prophylaxis: Lovenox SQ Code Status: Full code  Family Communication:  Level of care: Med-Surg Dispo:   The patient is from: home Anticipated d/c is to: SNF rehab Anticipated d/c date is: to be detemrined   Subjective and Interval History:  No new complaint.  No pain in her surgical site.  Podiatry  checked wound today.   Objective: Vitals:   05/25/22 0016 05/25/22 0406 05/25/22 0749 05/25/22 1600  BP: 113/67 109/71 135/84 128/84  Pulse: 65 71 72 73  Resp: '16 18 17 17  '$ Temp: 99.7 F (37.6 C) 98.2 F (36.8 C) 98.7 F (37.1 C) 97.8 F (36.6 C)  TempSrc: Oral Oral    SpO2: 98% 98% 98% 98%  Weight:      Height:        Intake/Output Summary (Last 24 hours) at 05/25/2022 2032 Last data filed at 05/25/2022 1806 Gross per 24 hour  Intake 900 ml  Output 2000 ml  Net -1100 ml   Filed Weights   05/21/22 2344 05/22/22 1221 05/24/22 1637  Weight: 107.4 kg 108.1 kg 108 kg    Examination:   Constitutional: NAD, AAOx3 HEENT: conjunctivae and lids normal, EOMI CV: No cyanosis.   RESP: normal respiratory effort, on RA Extremities: right foot wrapped Neuro: II - XII grossly intact.   Psych: Normal mood and affect.  Appropriate judgement and reason   Data Reviewed: I have personally reviewed labs and imaging studies  Time spent: 35 minutes  Enzo Bi, MD [facility] If 7PM-7AM, please contact night-coverage 05/25/2022, 8:32 PM

## 2022-05-25 NOTE — Care Management Important Message (Signed)
Important Message  Patient Details  Name: ANIELA CANIGLIA MRN: [id number] Date of Birth: [birth year]   Medicare Important Message Given:  Yes     Dannette Barbara 05/25/2022, 12:32 PM

## 2022-05-25 NOTE — Progress Notes (Signed)
Subjective: 1 Day Post-Op Procedure(s) (LRB): RIGHT REVISION INCISION AND DRAINAGE/ WASHOUT WITH PRIMARY DELAYED CLOSURE (Right) DOS: 05/24/2022  Patient resting comfortably in the recliner at presentation today.  Patient eating with healthy appetite.  Patient states that overall she does feel significantly better.  Neuropathic and no pain to the surgical foot.  Objective: Vital signs in last 24 hours: Temp:  [96.9 F (36.1 C)-99.7 F (37.6 C)] 97.8 F (36.6 C) (02/06 1600) Pulse Rate:  [63-103] 73 (02/06 1600) Resp:  [10-18] 17 (02/06 1600) BP: (95-135)/(62-84) 128/84 (02/06 1600) SpO2:  [94 %-99 %] 98 % (02/06 1600) FiO2 (%):  [21 %] 21 % (02/05 2336)    Recent Labs    05/23/22 0929 05/24/22 0231 05/25/22 0256  HGB 10.0* 9.6* 10.0*   Recent Labs    05/24/22 0231 05/25/22 0256  WBC 17.4* 11.3*  RBC 3.01* 3.19*  HCT 28.7* 30.6*  PLT 263 254   Recent Labs    05/24/22 0231 05/25/22 0256  NA 136 134*  K 3.7 3.7  CL 107 103  CO2 20* 26  BUN 16 12  CREATININE 0.62 0.69  GLUCOSE 109* 88  CALCIUM 8.5* 8.3*    POD #1.  RT foot.  05/25/2022  POD #1.  RT foot.  05/25/2022   Stable.  No malodor.  Serosanguineous drainage.  Retention suture and iodoform packing intact.  Assessment/Plan: 1 Day Post-Op Procedure(s) (LRB): RIGHT REVISION INCISION AND DRAINAGE/ WASHOUT WITH PRIMARY DELAYED CLOSURE (Right) DOS: 05/25/2022  -Dressings changed.  Leave clean dry and intact -NWB surgical extremity -Will plan for return to the OR on 05/27/2022 for repeat washout, revisional amputation and resection of necrotic bone and soft tissue and primary closure of the amputation site.   -N.p.o. after midnight evening of 05/26/2022 -Continue Unasyn as per infectious disease.  Always greatly appreciated -Will follow  [name] 05/25/2022, 6:09 PM  [name], DPM [facility]  Dr. [name], DPM    [address],  [address]                Office [phone]  Fax [phone]

## 2022-05-25 NOTE — Evaluation (Signed)
Physical Therapy Evaluation Patient Details Name: [name] [name] MRN: [id number] DOB: 03-26-1966 Today's Date: 05/25/2022  History of Present Illness  57 year old female with history of bipolar disorder, total arthroplasty right shoulder April 14, 2022, chronic pain syndrome, anxiety depression, recent right toe amputation who presented to emergency room with altered mental status and redness of the right foot amputation.  Clinical Impression  Pt with some understandable hesitancy, but ultimately she showed great effort with bed mobility and slide transfer to the recliner with board.  We deferred standing to avoid heavy R UE WBing (per conversation with her total shoulder orthopedic she is okay to bear weight through walker).  She initially struggled with slide to L with transfer board, but after some cuing and feeling more comfortable with the strategies she improved and was ultimately able to get to recliner with only light min assist.  She did well maintaining NWBing on R LE.  PT will need continued PT to address functional limitations, very limited mobility at this point.       Recommendations for follow up therapy are one component of a multi-disciplinary discharge planning process, led by the attending physician.  Recommendations may be updated based on patient status, additional functional criteria and insurance authorization.  Follow Up Recommendations Skilled nursing-short term rehab (<3 hours/day) Can patient physically be transported by private vehicle: No    Assistance Recommended at Discharge Frequent or constant Supervision/Assistance  Patient can return home with the following  A lot of help with walking and/or transfers;A lot of help with bathing/dressing/bathroom;Assistance with cooking/housework;Assist for transportation;Help with stairs or ramp for entrance    Equipment Recommendations Other (comment) (TBD - has knee scooter and RW)  Recommendations for Other Services        Functional Status Assessment Patient has had a recent decline in their functional status and demonstrates the ability to make significant improvements in function in a reasonable and predictable amount of time.     Precautions / Restrictions Precautions Precautions: Fall Restrictions Weight Bearing Restrictions: Yes RUE Weight Bearing:  (per her shoulder replacement orthopedic Dr Griffin Basil: full WBAT through walker is OK) RLE Weight Bearing: Non weight bearing      Mobility  Bed Mobility Overal bed mobility: Needs Assistance Bed Mobility: Supine to Sit     Supine to sit: Min guard     General bed mobility comments: Pt needing rails, but able to get herself up to sitting EOB w/o direct assist    Transfers Overall transfer level: Needs assistance Equipment used: Sliding board Transfers: Bed to chair/wheelchair/BSC            Lateral/Scoot Transfers: Min guard, Min assist General transfer comment: Pt hesitant to do a lot of R shoulder pushing but with extra time/cuing and guarded effort she was able to shift hips to the L to slide board to recliner.  She struggled at first, but ultimatley with cuing for weight shifts/strategy she ultimately showed improved effort and was able to complete transfer with only occasional minimal assist, mosly CGA - did well maintaining R NWBing.    Ambulation/Gait               General Gait Details: deferred, slide transfers only this date  Stairs            Wheelchair Mobility    Modified Rankin (Stroke Patients Only)       Balance Overall balance assessment: Needs assistance Sitting-balance support: Bilateral upper extremity supported Sitting balance-Leahy Scale: Good  Standing balance support:  (deferred standing this session)                                 Pertinent Vitals/Pain Pain Assessment Pain Assessment: 0-10 Pain Score: 7  Pain Location: R foot, R shoulder    Home Living  Family/patient expects to be discharged to:: Unsure                   Additional Comments: Pt lives with her mom, ramp to enter, has RW, knees scooter.    Prior Function Prior Level of Function : Independent/Modified Independent             Mobility Comments: Uses Hurry Cane, or RW - recently decreasing activity 2/2 R foot pain.  Has had a few falls ADLs Comments: More limited recently but reports she is independent/modified indep for ADL management.     Hand Dominance        Extremity/Trunk Assessment   Upper Extremity Assessment Upper Extremity Assessment: Generalized weakness (deferred resisted activity at R shoulder)    Lower Extremity Assessment Lower Extremity Assessment: Overall WFL for tasks assessed (expected limited R ankle AP/DF ROM/tolerance)       Communication   Communication: No difficulties  Cognition Arousal/Alertness: Awake/alert Behavior During Therapy: WFL for tasks assessed/performed Overall Cognitive Status: Within Functional Limits for tasks assessed                                          General Comments General comments (skin integrity, edema, etc.): Pt showed good effort with mobility and transfers, deferred standing awaiting UE WBing recs (~6 weeks post total shoulder)    Exercises     Assessment/Plan    PT Assessment Patient needs continued PT services  PT Problem List Decreased strength;Decreased range of motion;Decreased balance;Decreased activity tolerance;Decreased mobility;Decreased knowledge of use of DME;Decreased safety awareness;Decreased knowledge of precautions       PT Treatment Interventions DME instruction;Gait training;Stair training;Functional mobility training;Therapeutic activities;Therapeutic exercise;Balance training;Neuromuscular re-education;Patient/family education;Wheelchair mobility training    PT Goals (Current goals can be found in the Care Plan section)  Acute Rehab PT  Goals Patient Stated Goal: get back home PT Goal Formulation: With patient Time For Goal Achievement: 06/07/22 Potential to Achieve Goals: Fair    Frequency 7X/week     Co-evaluation               AM-PAC PT "6 Clicks" Mobility  Outcome Measure Help needed turning from your back to your side while in a flat bed without using bedrails?: None Help needed moving from lying on your back to sitting on the side of a flat bed without using bedrails?: None Help needed moving to and from a bed to a chair (including a wheelchair)?: A Little Help needed standing up from a chair using your arms (e.g., wheelchair or bedside chair)?: A Lot Help needed to walk in hospital room?: A Lot Help needed climbing 3-5 steps with a railing? : Total 6 Click Score: 16    End of Session Equipment Utilized During Treatment: Gait belt Activity Tolerance: Patient tolerated treatment well Patient left: with call bell/phone within reach;with chair alarm set Nurse Communication: Mobility status PT Visit Diagnosis: Muscle weakness (generalized) (M62.81);Difficulty in walking, not elsewhere classified (R26.2)    Time: 1115-1140 PT Time Calculation (min) (ACUTE  ONLY): 25 min   Charges:   PT Evaluation $PT Eval Low Complexity: 1 Low PT Treatments $Therapeutic Activity: 8-22 mins        Kreg Shropshire, DPT 05/25/2022, 4:26 PM

## 2022-05-25 NOTE — Progress Notes (Addendum)
[facility] for Infectious Disease  Date of Admission:  05/21/2022           Reason for visit: Follow up on foot infection  Current antibiotics: Unasyn   ASSESSMENT:    57 y.o. 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  No history of diabetes and previous ABI's were normal.  Status post OR 05/22/22 where cultures are growing Staph aureus.  Operative findings not involving the bony margins per podiatry and OM also not obviously appreciated on imaging.  She is s/p repeat I&D 05/24/22 and planning to go back to the OR later this week for further washout and closure. Penicillin allergy:  Described as non-specific rash as a child.  Tolerated Keflex in the past and appears to be doing fine on Unasyn. Morbid obesity: Body mass index is 40.87 kg/m.   RECOMMENDATIONS:    Continue Unasyn Follow up Staph susceptibilities.  Anticipate MSSA OR planning per podiatry Wound care Lab monitoring Following ADDENDUM 9:35 AM : Cultures have MRSA.  Will start back vancomycin   Principal Problem:   Cellulitis of right foot Active Problems:   GERD (gastroesophageal reflux disease)   Essential hypertension   Severe sepsis ([name])   AMS (altered mental status)   Septic shock ([name])   Abscess of right foot    MEDICATIONS:    Scheduled Meds:  aspirin  81 mg Oral BID   busPIRone  15 mg Oral TID   Chlorhexidine Gluconate Cloth  6 each Topical Q0600   DULoxetine  20 mg Oral Daily   enoxaparin (LOVENOX) injection  0.5 mg/kg Subcutaneous Q24H   escitalopram  10 mg Oral Daily   estradiol  1 mg Oral QHS   insulin aspart  1-3 Units Subcutaneous Q4H   lamoTRIgine  100 mg Oral Daily   pregabalin  100 mg Oral TID   sodium chloride flush  3 mL Intravenous Q12H   topiramate  50 mg Oral BID   Continuous Infusions:  sodium chloride Stopped (05/24/22 1920)   ampicillin-sulbactam (UNASYN) IV 3 g (05/25/22 0601)   PRN  Meds:.acetaminophen **OR** acetaminophen, albuterol, ondansetron (ZOFRAN) IV, mouth rinse, oxyCODONE-acetaminophen  SUBJECTIVE:   24 hour events:  Afebrile WBC improving S/p repeat OR No other events noted Tolerating unasyn     OBJECTIVE:     Lab Results: Lab Results  Component Value Date   WBC 11.3 (H) 05/25/2022   HGB 10.0 (L) 05/25/2022   HCT 30.6 (L) 05/25/2022   MCV 95.9 05/25/2022   PLT 254 05/25/2022    Lab Results  Component Value Date   NA 134 (L) 05/25/2022   K 3.7 05/25/2022   CO2 26 05/25/2022   GLUCOSE 88 05/25/2022   BUN 12 05/25/2022   CREATININE 0.69 05/25/2022   CALCIUM 8.3 (L) 05/25/2022   GFRNONAA >60 05/25/2022   GFRAA 119 06/27/2020    Lab Results  Component Value Date   ALT 10 05/22/2022   AST 18 05/22/2022   ALKPHOS 71 05/22/2022   BILITOT 0.7 05/22/2022       Component Value Date/Time   CRP 2.1 (H) 03/15/2022 2002       Component Value Date/Time   ESRSEDRATE 23 (H) 03/15/2022 2002     I have reviewed the micro and lab results in [facility].  Imaging: No results found.      Raynelle Highland for Infectious Disease [facility]  [phone] pager 05/25/2022, 7:58 AM

## 2022-05-26 DIAGNOSIS — L03115 Cellulitis of right lower limb: Secondary | ICD-10-CM | POA: Diagnosis not present

## 2022-05-26 DIAGNOSIS — L02611 Cutaneous abscess of right foot: Secondary | ICD-10-CM | POA: Diagnosis not present

## 2022-05-26 LAB — BASIC METABOLIC PANEL
Anion gap: 13 (ref 5–15)
BUN: 10 mg/dL (ref 6–20)
CO2: 22 mmol/L (ref 22–32)
Calcium: 8.5 mg/dL — ABNORMAL LOW (ref 8.9–10.3)
Chloride: 105 mmol/L (ref 98–111)
Creatinine, Ser: 0.55 mg/dL (ref 0.44–1.00)
GFR, Estimated: >60 mL/min (ref >60)
Glucose, Bld: 96 mg/dL (ref 70–99)
Potassium: 3.9 mmol/L (ref 3.5–5.1)
Sodium: 140 mmol/L (ref 135–145)

## 2022-05-26 LAB — GLUCOSE, CAPILLARY
Glucose-Capillary: 103 mg/dL — ABNORMAL HIGH (ref 70–99)
Glucose-Capillary: 103 mg/dL — ABNORMAL HIGH (ref 70–99)
Glucose-Capillary: 106 mg/dL — ABNORMAL HIGH (ref 70–99)
Glucose-Capillary: 104 mg/dL — ABNORMAL HIGH (ref 70–99)

## 2022-05-26 LAB — CBC
HCT: 33.2 % — ABNORMAL LOW (ref 36.0–46.0)
Hemoglobin: 10.8 g/dL — ABNORMAL LOW (ref 12.0–15.0)
MCH: 31.4 pg (ref 26.0–34.0)
MCHC: 32.5 g/dL (ref 30.0–36.0)
MCV: 96.5 fL (ref 80.0–100.0)
Platelets: 296 10*3/uL (ref 150–400)
RBC: 3.44 MIL/uL — ABNORMAL LOW (ref 3.87–5.11)
RDW: 14.3 % (ref 11.5–15.5)
WBC: 10.5 10*3/uL (ref 4.0–10.5)
nRBC: 0 % (ref 0.0–0.2)

## 2022-05-26 LAB — MAGNESIUM: Magnesium: 2.2 mg/dL (ref 1.7–2.4)

## 2022-05-26 LAB — CULTURE, BLOOD (ROUTINE X 2)
Culture: NO GROWTH
Special Requests: ADEQUATE

## 2022-05-26 MED ORDER — SENNOSIDES-DOCUSATE SODIUM 8.6-50 MG PO TABS
2.0000 | ORAL_TABLET | Freq: Two times a day (BID) | ORAL | Status: AC
  Administered 2022-05-26 – 2022-05-27 (×2): 2 via ORAL
  Filled 2022-05-26 (×2): qty 2

## 2022-05-26 MED ORDER — POLYETHYLENE GLYCOL 3350 17 G PO PACK
17.0000 g | PACK | Freq: Two times a day (BID) | ORAL | Status: AC
  Administered 2022-05-26 – 2022-05-27 (×2): 17 g via ORAL
  Filled 2022-05-26 (×2): qty 1

## 2022-05-26 MED ORDER — INSULIN ASPART 100 UNIT/ML IJ SOLN: 0.0000 [IU] | Freq: Three times a day (TID) | INTRAMUSCULAR | Status: DC

## 2022-05-26 NOTE — Progress Notes (Signed)
  Subjective:  Patient ID: [name] [name], female    DOB: 1965/12/25,  MRN: [id number]  Seen at bedside POD #2 secondary washout of right foot infection  Negative for chest pain and shortness of breath Fever: no Night sweats: no Constitutional signs: no Review of all other systems is negative Objective:   Vitals:   05/26/22 0829 05/26/22 1500  BP: (!) 145/86 119/78  Pulse: 71 87  Resp: 17 18  Temp: 97.9 F (36.6 C) 97.7 F (36.5 C)  SpO2: 95% 100%   General AA&O x3. Normal mood and affect.  Vascular Dorsalis pedis and posterior tibial pulses 2/4 bilat.    Neurologic Epicritic sensation grossly absent.  Dermatologic No purulence found today.  Cellulitis is essentially resolved.  Some minor necrosis at apex of incisions  Orthopedic: MMT 5/5 in dorsiflexion, plantarflexion, inversion, and eversion. Normal joint ROM without pain or crepitus.   Leukocytosis normalized  Cultures with MRSA Assessment & Plan:  Patient was evaluated and treated and all questions answered.  Right foot infection status post TMA with MRSA -OR tomorrow for revisional TMA and closure -N.p.o. past midnight -Nonweightbearing right lower extremity -Dressing changed today.  Repacked with iodoform -Infectious ease following for antibiotic management, hopeful can have surgical cure of osteomyelitis, will send pathology  [name], DPM  Accessible via secure chat for questions or concerns.

## 2022-05-26 NOTE — Progress Notes (Signed)
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    PT Comments    Pt reports some nausea and back pain on arrival, but still willing to work with PT and eager to get out of the bed and into the recliner.  She was able to do a little standing and turning to the recliner with the RW but is still hesitant to put a lot of weight through the R shoulder and was really only able to do some limited heel-toe swiveling while struggling to do any real hopping/stepping.  Pt showed great effort, however, with this and with bed exercises.    Recommendations for follow up therapy are one component of a multi-disciplinary discharge planning process, led by the attending physician.  Recommendations may be updated based on patient status, additional functional criteria and insurance authorization.  Follow Up Recommendations  Follow physician's recommendations for discharge plan and follow up therapies (Pt wishing to return home, does have 24/7 assist available and knee scooter, will need HHPT) Can patient physically be transported by private vehicle: Yes   Assistance Recommended at Discharge Frequent or constant Supervision/Assistance  Patient can return home with the following A lot of help with walking and/or transfers;A lot of help with bathing/dressing/bathroom;Assistance with cooking/housework;Assist for transportation;Help with stairs or ramp for entrance   Equipment Recommendations   (has knee scooter, RW, w/c)    Recommendations for Other Services       Precautions / Restrictions Precautions Precautions:  Fall Restrictions Weight Bearing Restrictions: Yes RUE Weight Bearing: Weight bearing as tolerated (per total shoulder MD) RLE Weight Bearing: Non weight bearing     Mobility  Bed Mobility Overal bed mobility: Needs Assistance Bed Mobility: Supine to Sit     Supine to sit: Min guard     General bed mobility comments: light use of rails rails, but able to get herself up to sitting EOB w/o direct assist    Transfers Overall transfer level: Needs assistance Equipment used: Rolling walker (2 wheels) Transfers: Sit to/from Stand Sit to Stand: Min assist, From elevated surface           General transfer comment: Unable to rise to standing from standard height bed, raised ~2" and gave light min assist on R (due to hesitancy pushing through R shoulder).  Pt able to bear weight through walker confidently through L UE, hesitant t/o standing on the R, but did  well keeping weight fully off the R foot.  She was able to take a few foot clearing hops, but R shoulder did not tolerate much of this and she did better with heel-toe shuffling.  Ulimately she turned almost 90* to the recliner but started to lose balance and needed to quickly place L arm back to recliner, min assist to control descent.    Ambulation/Gait               General Gait Details: no true ambulation, managed a few turning "hops" on L but struggled to consistently clear her foot resorting to heel-toe shuffling   Stairs             Wheelchair Mobility    Modified Rankin (Stroke Patients Only)  Balance Overall balance assessment: Needs assistance Sitting-balance support: No upper extremity supported Sitting balance-Leahy Scale: Good     Standing balance support: Bilateral upper extremity supported Standing balance-Leahy Scale: Poor Standing balance comment: able to statically maintain balance on L foot in walker, attempts at movement/unweighting revealed significant unsteadiness.                             Cognition Arousal/Alertness: Awake/alert Behavior During Therapy: WFL for tasks assessed/performed Overall Cognitive Status: Within Functional Limits for tasks assessed                                          Exercises General Exercises - Lower Extremity Ankle Circles/Pumps: AROM, 10 reps Long Arc Quad: Strengthening, 10 reps Heel Slides: Strengthening, 10 reps Hip ABduction/ADduction: Strengthening, 10 reps Straight Leg Raises: AROM, 5 reps Hip Flexion/Marching: Strengthening, 10 reps    General Comments        Pertinent Vitals/Pain Pain Assessment Pain Assessment: 0-10 Pain Score: 7  Pain Location: L back, R shoulder    Home Living                          Prior Function            PT Goals (current goals can now be found in the care plan section) Progress towards PT goals: Progressing toward goals    Frequency    7X/week      PT Plan Current plan remains appropriate    Co-evaluation              AM-PAC PT "6 Clicks" Mobility   Outcome Measure  Help needed turning from your back to your side while in a flat bed without using bedrails?: None Help needed moving from lying on your back to sitting on the side of a flat bed without using bedrails?: None Help needed moving to and from a bed to a chair (including a wheelchair)?: A Little Help needed standing up from a chair using your arms (e.g., wheelchair or bedside chair)?: A Lot Help needed to walk in hospital room?: A Lot Help needed climbing 3-5 steps with a railing? : Total 6 Click Score: 16    End of Session Equipment Utilized During Treatment: Gait belt Activity Tolerance: Patient tolerated treatment well Patient left: with call bell/phone within reach;with chair alarm set Nurse Communication: Mobility status PT Visit Diagnosis: Muscle weakness (generalized) (M62.81);Difficulty in walking, not elsewhere classified (R26.2)     Time:  2025-4270 PT Time Calculation (min) (ACUTE ONLY): 25 min  Charges:  $Therapeutic Exercise: 8-22 mins $Therapeutic Activity: 8-22 mins                     Kreg Shropshire, DPT 05/26/2022, 3:12 PM

## 2022-05-26 NOTE — Progress Notes (Addendum)
[facility] for Infectious Disease  Date of Admission:  05/21/2022           Reason for visit: Follow up on foot infection  Current antibiotics: Unasyn Vancomycin  ASSESSMENT:    57 y.o. 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  No history of diabetes and previous ABI's were normal.  Status post OR 05/22/22 where cultures are growing MRSA.  Operative findings not involving the bony margins per podiatry and OM also not obviously appreciated on imaging.  She is s/p repeat I&D 05/24/22 and plan per podiatry to return to the OR 2/8 for repeat washout and primary closure. Penicillin allergy:  Described as non-specific rash as a child.  Tolerated Keflex in the past and has tolerated Unasyn this admission. Tetracycline allergy: She reports that it "put me in a coma". Morbid obesity: Body mass index is 40.87 kg/m.   RECOMMENDATIONS:    Continue vancomycin per pharmacy Stop Unasyn OR planning per podiatry Follow OR cultures Lab monitoring, wound care Contact precautions with MRSA Following.   Principal Problem:   Cellulitis of right foot Active Problems:   GERD (gastroesophageal reflux disease)   Essential hypertension   Severe sepsis ([name])   AMS (altered mental status)   Septic shock ([name])   Abscess of right foot    MEDICATIONS:    Scheduled Meds:  aspirin  81 mg Oral BID   busPIRone  15 mg Oral TID   Chlorhexidine Gluconate Cloth  6 each Topical Q0600   DULoxetine  20 mg Oral Daily   enoxaparin (LOVENOX) injection  0.5 mg/kg Subcutaneous Q24H   escitalopram  10 mg Oral Daily   estradiol  1 mg Oral QHS   insulin aspart  1-3 Units Subcutaneous Q4H   lamoTRIgine  100 mg Oral Daily   naloxegol oxalate  25 mg Oral Daily   pregabalin  100 mg Oral TID   sodium chloride flush  3 mL Intravenous Q12H   topiramate  50 mg Oral BID   Continuous Infusions:  sodium chloride Stopped (05/24/22 1920)    vancomycin 1,500 mg (05/26/22 0957)   PRN Meds:.acetaminophen **OR** acetaminophen, albuterol, ondansetron (ZOFRAN) IV, mouth rinse, oxyCODONE-acetaminophen  SUBJECTIVE:   24 hour events:  No acute events noted overnight Per podiatry planning to return to the OR tomorrow Afebrile WBC 10.5 Creatinine stable OR cultures from 2/3 growing MRSA  Patient with no new complaints.  Tolerating antibiotics.  She reports plan to return to the OR tomorrow as per podiatry notes.  She reports receiving tetracyclines as a young person that "put me in a coma".  She has no other recollection of this allergy.  Review of Systems  All other systems reviewed and are negative.     OBJECTIVE:   Blood pressure (!) 145/86, pulse 71, temperature 97.9 F (36.6 C), resp. rate 17, height '5\' 4"'$  (1.626 m), weight 108 kg, SpO2 95 %. Body mass index is 40.87 kg/m.  Physical Exam Constitutional:      Appearance: Normal appearance.  HENT:     Head: Normocephalic and atraumatic.  Eyes:     Extraocular Movements: Extraocular movements intact.     Conjunctiva/sclera: Conjunctivae normal.  Abdominal:     General: There is no distension.     Palpations: Abdomen is soft.  Skin:    General: Skin is warm and dry.  Neurological:     General: No focal deficit present.  Mental Status: She is alert and oriented to person, place, and time.  Psychiatric:        Mood and Affect: Mood normal.        Behavior: Behavior normal.         Lab Results: Lab Results  Component Value Date   WBC 10.5 05/26/2022   HGB 10.8 (L) 05/26/2022   HCT 33.2 (L) 05/26/2022   MCV 96.5 05/26/2022   PLT 296 05/26/2022    Lab Results  Component Value Date   NA 140 05/26/2022   K 3.9 05/26/2022   CO2 22 05/26/2022   GLUCOSE 96 05/26/2022   BUN 10 05/26/2022   CREATININE 0.55 05/26/2022   CALCIUM 8.5 (L) 05/26/2022   GFRNONAA >60 05/26/2022   GFRAA 119 06/27/2020    Lab Results  Component Value Date   ALT 10  05/22/2022   AST 18 05/22/2022   ALKPHOS 71 05/22/2022   BILITOT 0.7 05/22/2022       Component Value Date/Time   CRP 2.1 (H) 03/15/2022 2002       Component Value Date/Time   ESRSEDRATE 23 (H) 03/15/2022 2002     I have reviewed the micro and lab results in Epic.  Imaging: No results found.   Imaging independently reviewed in Epic.    Raynelle Highland for Infectious Disease [facility] [phone] pager 05/26/2022, 11:23 AM

## 2022-05-26 NOTE — TOC Initial Note (Signed)
Transition of Care [facility]) - Initial/Assessment Note    Patient Details  Name: [name] [name] MRN: [id number] Date of Birth: 1965-05-05  Transition of Care [facility]) CM/SW Contact:    Candie Chroman, LCSW Phone Number: 05/26/2022, 2:36 PM  Clinical Narrative: CSW met with patient. No supports at bedside. CSW introduced role and explained that PT recommendations would be discussed. Patient is not interested in SNF placement but agreeable to home health. She understands that home health will only come out 2-3 times per week. Patient lives with her mother and said she can help care for her. [facility] is reviewing referral. No further concerns. CSW encouraged patient to contact CSW as needed. CSW will continue to follow patient for support and facilitate return home once stable.                 Expected Discharge Plan: [place] Barriers to Discharge: Continued Medical Work up   Patient Goals and CMS Choice            Expected Discharge Plan and Services     Post Acute Care Choice: [place] arrangements for the past 2 months: [place]                                      Prior Living Arrangements/Services Living arrangements for the past 2 months: Single Family Home Lives with:: Parents Patient language and need for interpreter reviewed:: Yes Do you feel safe going back to the place where you live?: Yes      Need for Family Participation in Patient Care: Yes (Comment) Care giver support system in place?: Yes (comment)   Criminal Activity/Legal Involvement Pertinent to Current Situation/Hospitalization: No - Comment as needed  Activities of Daily Living Home Assistive Devices/Equipment: None ADL Screening (condition at time of admission) Patient's cognitive ability adequate to safely complete daily activities?: Yes Is the patient deaf or have difficulty hearing?: No Does the patient have difficulty seeing, even when wearing  glasses/contacts?: No Does the patient have difficulty concentrating, remembering, or making decisions?: No Patient able to express need for assistance with ADLs?: Yes Does the patient have difficulty dressing or bathing?: Yes Independently performs ADLs?: No Does the patient have difficulty walking or climbing stairs?: Yes Weakness of Legs: Right Weakness of Arms/Hands: None  Permission Sought/Granted Permission sought to share information with : Facility Art therapist granted to share information with : Yes, Verbal Permission Granted     Permission granted to share info w AGENCY: Home health agencies        Emotional Assessment Appearance:: Appears stated age Attitude/Demeanor/Rapport: Engaged, Gracious Affect (typically observed): Accepting, Appropriate, Calm, Pleasant Orientation: : Oriented to Self, Oriented to Place, Oriented to  Time, Oriented to Situation Alcohol / Substance Use: Not Applicable Psych Involvement: No (comment)  Admission diagnosis:  Delirium [R41.0] Hyponatremia [E87.1] Cellulitis of right foot [L03.115] Septic shock ([name]) [A41.9, R65.21] Severe sepsis ([place]) [A41.9, R65.20] AMS (altered mental status) [R41.82] Patient Active Problem List   Diagnosis Date Noted   Morbid obesity ([place]) 05/26/2022   Cellulitis of right foot 05/22/2022   Severe sepsis ([place]) 05/22/2022   AMS (altered mental status) 05/22/2022   Septic shock ([place]) 05/22/2022   Abscess of right foot 05/22/2022   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) 06/30/2021   Hyponatremia 06/30/2021   Open wound of right foot, initial encounter 10/21/2020   Finger ulcer ([place]) 10/21/2020   Syncope 08/26/2019   Non-healing open wound of toe 05/15/2019   Osteomyelitis ([place]) 05/15/2019   Palpitations 10/26/2018   Foot ulcer  ([place]) 01/31/2018   Hyperglycemia 01/31/2018   Left leg cellulitis 09/24/2017   Syncope and collapse    Near syncope 11/23/2016   Abdominal pain 09/23/2016   Anxiety 09/23/2016   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[place]) 09/23/2016   Constipation 09/23/2016   DDD (degenerative disc disease), lumbosacral 09/23/2016   Migraines 09/23/2016   Myelopathy ([place]) 09/23/2016   Nausea 09/23/2016   Post laminectomy syndrome 09/23/2016   Pseudoarthrosis of lumbar spine 09/23/2016   Shortness of breath 09/23/2016   Sleep apnea 09/23/2016   Spinal stenosis of lumbar region 09/23/2016   Vertigo 09/23/2016   Hallux malleus 11/15/2015   Chronic pain syndrome    GERD (gastroesophageal reflux disease)    Depression    Rash 11/14/2015   Toe ulcer, right ([place]) 04/11/2015   Cellulitis of toe of right foot 04/11/2015   Nonspecific chest pain    Chest pain 10/26/2014   Obesity (BMI 30-39.9) 10/26/2014   Hypokalemia 10/26/2014   Manic bipolar I disorder ([place]) 10/26/2014   Atypical angina 10/26/2014   Tardive dyskinesia 08/26/2011   Manic bipolar I disorder with rapid cycling ([place]) 05/18/2011    Class: Acute   Asthma 02/24/2011   Essential hypertension 02/24/2011   History of migraine headaches 02/24/2011   PCP:  [name], MD Pharmacy:   [facility], [place] [address] [place] [address] Phone: [phone] Fax: [phone]  [facility] - [place], [place] - [address] [place] [address] Phone: [phone] Fax: [phone]  [id number] [place] [address] [place]: [phone]: [phone] [id number] [place], [place] [address] [place]: [phone]: [phone]   Social Determinants of  Health (SDOH) Social History: SDOH Screenings   Food Insecurity: No Food Insecurity (05/22/2022)  Housing: Low Risk  (05/22/2022)  Transportation Needs: No Transportation Needs (05/22/2022)  Utilities: Not At Risk (05/22/2022)  Alcohol Screen: Low Risk  (08/21/2020)  Depression (PHQ2-9): Low Risk  (09/18/2021)  Financial Resource Strain: Low Risk  (08/21/2020)  Physical Activity: Inactive (08/21/2020)  Social Connections: Socially Isolated (08/21/2020)  Stress: No Stress Concern Present (08/21/2020)  Tobacco Use: Low Risk  (05/25/2022)   SDOH Interventions:     Readmission Risk Interventions     No data to display

## 2022-05-26 NOTE — Progress Notes (Signed)
PROGRESS NOTE    FRED HAMMES   ZRA:[id number] DOB: 1965/07/31  DOA: 05/21/2022 Date of Service: 05/26/22 PCP: [name], MD     Brief Narrative / Hospital Course:  Cary Wilford is 57 year old female with history of bipolar disorder, total arthroplasty right shoulder December 2023, chronic pain syndrome, anxiety depression, who presented to emergency room 05/21/22 with altered mental status and redness of the right foot amputation stump, was on Keflex at home, TMA 2 mos prior.  02/02: to ED as above. Septic shock, required brief levo gtt. 02/03: admitted to hospitalist service, underwent I&D w/ podiatry 02/04: stable 02/05: back to OR for revision I&D.   02/06-02/07: stable, planning back to OR 02/08. Cultures from 02/03 (+)MRSA   Consultants:  Podiatry  Infectious Disease   Procedures: 05/22/22: I&D RLE TMA stump 05/24/22: I&D w/ revision/washout w/ primary delayed closure RLE TMA stump [PLANNED 05/27/22 repeat washout, revisional amputation and resection of necrotic bone and soft tissue and primary closure of the amputation site per podiatry]      ASSESSMENT & PLAN:   Principal Problem:   Cellulitis of right foot Active Problems:   Severe sepsis ([name])   GERD (gastroesophageal reflux disease)   Essential hypertension   AMS (altered mental status)   Septic shock ([name])   Abscess of right foot  Septic shock - POA - resolved  Right foot cellulitis and abscess at site of prior TMA with history of prior ulceration (+)MRSA NWB surgical extremity  Will plan for return to the OR on 05/27/2022 for repeat washout, revisional amputation and resection of necrotic bone and soft tissue and primary closure of the amputation site.  cont IV vanc    Depression anxiety bipolar disorder history of seizure disorder cont Buspar, Cymbalta and Lexapro  cont lamotrigine and Topamax   Chronic pain syndrome recent right shoulder arthroplasty cont home Percocet and  Lyrica Per PT discussion with Dr Griffin Basil, FWBing through R UE on the walker   Morbid obesity, BMI 40 Complicates treatment and prognosis  Sleep apnea CPAP nightly     DVT prophylaxis: lovenox   Pertinent IV fluids/nutrition: no continuous IV fluids  Central lines / invasive devices: none  Code Status: FULL CODE  Current Admission Status: inpatient  TOC needs / Dispo plan: none at this time, will need SNF on eventual discharge  Barriers to discharge / significant pending items: surgery tomorrow, PT/OT to follow, expect will need SNF              Subjective / Brief ROS:  Patient reports no concerns at this time Denies CP/SOB. Pain controlled though foot is sore Denies new weakness.  Tolerating diet.  Reports no concerns w/ urination/defecation.   Family Communication: none at this time     Objective Findings:  Vitals:   05/25/22 0749 05/25/22 1600 05/25/22 2318 05/26/22 0829  BP: 135/84 128/84 (!) 140/88 (!) 145/86  Pulse: 72 73 81 71  Resp: '17 17 20 17  '$ Temp: 98.7 F (37.1 C) 97.8 F (36.6 C) 98.1 F (36.7 C) 97.9 F (36.6 C)  TempSrc:      SpO2: 98% 98% 95% 95%  Weight:      Height:        Intake/Output Summary (Last 24 hours) at 05/26/2022 1338 Last data filed at 05/25/2022 1806 Gross per 24 hour  Intake 500 ml  Output 1000 ml  Net -500 ml   Filed Weights   05/21/22 2344 05/22/22 1221 05/24/22 1637  Weight: 107.4  kg 108.1 kg 108 kg    Examination:  Physical Exam Constitutional:      Appearance: She is obese.  Cardiovascular:     Rate and Rhythm: Normal rate and regular rhythm.     Heart sounds: Normal heart sounds.  Pulmonary:     Effort: Pulmonary effort is normal.     Breath sounds: Normal breath sounds.  Musculoskeletal:     Right lower leg: No edema (R foot bandage - clean, was not removed).     Left lower leg: No edema.  Neurological:     Mental Status: She is alert.          Scheduled Medications:   aspirin  81 mg  Oral BID   busPIRone  15 mg Oral TID   Chlorhexidine Gluconate Cloth  6 each Topical Q0600   DULoxetine  20 mg Oral Daily   enoxaparin (LOVENOX) injection  0.5 mg/kg Subcutaneous Q24H   escitalopram  10 mg Oral Daily   estradiol  1 mg Oral QHS   insulin aspart  0-6 Units Subcutaneous TID WC   lamoTRIgine  100 mg Oral Daily   naloxegol oxalate  25 mg Oral Daily   pregabalin  100 mg Oral TID   sodium chloride flush  3 mL Intravenous Q12H   topiramate  50 mg Oral BID    Continuous Infusions:  sodium chloride Stopped (05/24/22 1920)   vancomycin 1,500 mg (05/26/22 0957)    PRN Medications:  acetaminophen **OR** acetaminophen, albuterol, ondansetron (ZOFRAN) IV, mouth rinse, oxyCODONE-acetaminophen  Antimicrobials from admission:  Anti-infectives (From admission, onward)    Start     Dose/Rate Route Frequency Ordered Stop   05/25/22 1000  vancomycin (VANCOREADY) IVPB 1500 mg/300 mL        1,500 mg 150 mL/hr over 120 Minutes Intravenous Every 24 hours 05/25/22 0903     05/24/22 1810  vancomycin (VANCOCIN) powder  Status:  Discontinued          As needed 05/24/22 1810 05/24/22 1824   05/24/22 1230  Ampicillin-Sulbactam (UNASYN) 3 g in sodium chloride 0.9 % 100 mL IVPB  Status:  Discontinued        3 g 200 mL/hr over 30 Minutes Intravenous Every 6 hours 05/24/22 1142 05/26/22 1040   05/23/22 0000  cefTRIAXone (ROCEPHIN) 2 g in sodium chloride 0.9 % 100 mL IVPB  Status:  Discontinued        2 g 200 mL/hr over 30 Minutes Intravenous Every 24 hours 05/22/22 0127 05/22/22 1139   05/22/22 1400  clindamycin (CLEOCIN) IVPB 600 mg  Status:  Discontinued        600 mg 100 mL/hr over 30 Minutes Intravenous Every 8 hours 05/22/22 1142 05/23/22 1311   05/22/22 1300  meropenem (MERREM) 1 g in sodium chloride 0.9 % 100 mL IVPB  Status:  Discontinued        1 g 200 mL/hr over 30 Minutes Intravenous Every 8 hours 05/22/22 1219 05/24/22 1142   05/22/22 0300  vancomycin (VANCOREADY) IVPB 1500  mg/300 mL  Status:  Discontinued        1,500 mg 150 mL/hr over 120 Minutes Intravenous Every 24 hours 05/22/22 0209 05/24/22 1235   05/22/22 0000  cefTRIAXone (ROCEPHIN) 2 g in sodium chloride 0.9 % 100 mL IVPB        2 g 200 mL/hr over 30 Minutes Intravenous STAT 05/21/22 2351 05/22/22 0110   05/21/22 2345  vancomycin (VANCOCIN) IVPB 1000 mg/200 mL premix  1,000 mg 200 mL/hr over 60 Minutes Intravenous  Once 05/21/22 2340 05/22/22 0110   05/21/22 2345  aztreonam (AZACTAM) 2 g in sodium chloride 0.9 % 100 mL IVPB  Status:  Discontinued        2 g 200 mL/hr over 30 Minutes Intravenous  Once 05/21/22 2340 05/21/22 2351           Data Reviewed:  I have personally reviewed the following...  CBC: Recent Labs  Lab 05/21/22 2339 05/22/22 0203 05/22/22 0520 05/23/22 0929 05/24/22 0231 05/25/22 0256 05/26/22 0147  WBC 19.0*   < > 20.3* 19.9* 17.4* 11.3* 10.5  NEUTROABS 17.0*  --   --   --   --   --   --   HGB 12.0   < > 10.5* 10.0* 9.6* 10.0* 10.8*  HCT 37.0   < > 32.1* 30.4* 28.7* 30.6* 33.2*  MCV 97.4   < > 97.0 95.6 95.3 95.9 96.5  PLT 239   < > 212 206 263 254 296   < > = values in this interval not displayed.   Basic Metabolic Panel: Recent Labs  Lab 05/22/22 0203 05/23/22 0929 05/24/22 0231 05/25/22 0256 05/26/22 0147  NA 131* 134* 136 134* 140  K 4.0 3.8 3.7 3.7 3.9  CL 99 108 107 103 105  CO2 25 19* 20* 26 22  GLUCOSE 101* 148* 109* 88 96  BUN '10 13 16 12 10  '$ CREATININE 0.79 0.56 0.62 0.69 0.55  CALCIUM 8.3* 8.6* 8.5* 8.3* 8.5*  MG  --  2.1 2.2 2.0 2.2   GFR: Estimated Creatinine Clearance: 92 mL/min (by C-G formula based on SCr of 0.55 mg/dL). Liver Function Tests: Recent Labs  Lab 05/21/22 2339 05/22/22 0203  AST 17 18  ALT 12 10  ALKPHOS 85 71  BILITOT 0.8 0.7  PROT 7.5 5.8*  ALBUMIN 3.4* 2.7*   Recent Labs  Lab 05/21/22 2339  LIPASE 25   No results for input(s): "AMMONIA" in the last 168 hours. Coagulation Profile: Recent Labs   Lab 05/21/22 2339  INR 1.2   Cardiac Enzymes: No results for input(s): "CKTOTAL", "CKMB", "CKMBINDEX", "TROPONINI" in the last 168 hours. BNP (last 3 results) No results for input(s): "PROBNP" in the last 8760 hours. HbA1C: No results for input(s): "HGBA1C" in the last 72 hours. CBG: Recent Labs  Lab 05/25/22 1142 05/25/22 1702 05/25/22 2129 05/26/22 0827 05/26/22 1124  GLUCAP 90 85 98 103* 103*   Lipid Profile: No results for input(s): "CHOL", "HDL", "LDLCALC", "TRIG", "CHOLHDL", "LDLDIRECT" in the last 72 hours. Thyroid Function Tests: No results for input(s): "TSH", "T4TOTAL", "FREET4", "T3FREE", "THYROIDAB" in the last 72 hours. Anemia Panel: No results for input(s): "VITAMINB12", "FOLATE", "FERRITIN", "TIBC", "IRON", "RETICCTPCT" in the last 72 hours. Most Recent Urinalysis On File:     Component Value Date/Time   COLORURINE YELLOW (A) 05/22/2022 0530   APPEARANCEUR CLEAR (A) 05/22/2022 0530   LABSPEC 1.005 05/22/2022 0530   PHURINE 6.0 05/22/2022 0530   GLUCOSEU NEGATIVE 05/22/2022 0530   HGBUR NEGATIVE 05/22/2022 0530   BILIRUBINUR NEGATIVE 05/22/2022 0530   KETONESUR NEGATIVE 05/22/2022 0530   PROTEINUR NEGATIVE 05/22/2022 0530   UROBILINOGEN 0.2 06/23/2017 1108   NITRITE NEGATIVE 05/22/2022 0530   LEUKOCYTESUR NEGATIVE 05/22/2022 0530   Sepsis Labs: '@LABRCNTIP'$ (procalcitonin:4,lacticidven:4) Microbiology: Recent Results (from the past 240 hour(s))  Resp panel by RT-PCR (RSV, Flu A&B, Covid) Anterior Nasal Swab     Status: None   Collection Time: 05/21/22 11:39 PM  Specimen: Anterior Nasal Swab  Result Value Ref Range Status   SARS Coronavirus 2 by RT PCR NEGATIVE NEGATIVE Final    Comment: (NOTE) SARS-CoV-2 target nucleic acids are NOT DETECTED.  The SARS-CoV-2 RNA is generally detectable in upper respiratory specimens during the acute phase of infection. The lowest concentration of SARS-CoV-2 viral copies this assay can detect is 138 copies/mL. A  negative result does not preclude SARS-Cov-2 infection and should not be used as the sole basis for treatment or other patient management decisions. A negative result may occur with  improper specimen collection/handling, submission of specimen other than nasopharyngeal swab, presence of viral mutation(s) within the areas targeted by this assay, and inadequate number of viral copies(<138 copies/mL). A negative result must be combined with clinical observations, patient history, and epidemiological information. The expected result is Negative.  Fact Sheet for Patients:  EntrepreneurPulse.com.au  Fact Sheet for Healthcare Providers:  IncredibleEmployment.be  This test is no t yet approved or cleared by the [place] FDA and  has been authorized for detection and/or diagnosis of SARS-CoV-2 by FDA under an Emergency Use Authorization (EUA). This EUA will remain  in effect (meaning this test can be used) for the duration of the COVID-19 declaration under Section 564(b)(1) of the Act, 21 U.S.C.section 360bbb-3(b)(1), unless the authorization is terminated  or revoked sooner.       Influenza A by PCR NEGATIVE NEGATIVE Final   Influenza B by PCR NEGATIVE NEGATIVE Final    Comment: (NOTE) The Xpert Xpress SARS-CoV-2/FLU/RSV plus assay is intended as an aid in the diagnosis of influenza from Nasopharyngeal swab specimens and should not be used as a sole basis for treatment. Nasal washings and aspirates are unacceptable for Xpert Xpress SARS-CoV-2/FLU/RSV testing.  Fact Sheet for Patients: EntrepreneurPulse.com.au  Fact Sheet for Healthcare Providers: IncredibleEmployment.be  This test is not yet approved or cleared by the [place] FDA and has been authorized for detection and/or diagnosis of SARS-CoV-2 by FDA under an Emergency Use Authorization (EUA). This EUA will remain in effect (meaning this test can  be used) for the duration of the COVID-19 declaration under Section 564(b)(1) of the Act, 21 U.S.C. section 360bbb-3(b)(1), unless the authorization is terminated or revoked.     Resp Syncytial Virus by PCR NEGATIVE NEGATIVE Final    Comment: (NOTE) Fact Sheet for Patients: EntrepreneurPulse.com.au  Fact Sheet for Healthcare Providers: IncredibleEmployment.be  This test is not yet approved or cleared by the [place] FDA and has been authorized for detection and/or diagnosis of SARS-CoV-2 by FDA under an Emergency Use Authorization (EUA). This EUA will remain in effect (meaning this test can be used) for the duration of the COVID-19 declaration under Section 564(b)(1) of the Act, 21 U.S.C. section 360bbb-3(b)(1), unless the authorization is terminated or revoked.  Performed at [facility], [address] [place], [place] [address]   Blood Culture (routine x 2)     Status: None   Collection Time: 05/21/22 11:39 PM   Specimen: BLOOD  Result Value Ref Range Status   Specimen Description BLOOD LEFT ANTECUBITAL  Final   Special Requests   Final    BOTTLES DRAWN AEROBIC AND ANAEROBIC Blood Culture adequate volume   Culture   Final    NO GROWTH 5 DAYS Performed at [facility], [address] [place], [place] [address]    Report Status 05/26/2022 FINAL  Final  Blood Culture (routine x 2)     Status: None (Preliminary result)   Collection  Time: 05/21/22 11:44 PM   Specimen: BLOOD  Result Value Ref Range Status   Specimen Description BLOOD RIGHT ANTECUBITAL  Final   Special Requests   Final    BOTTLES DRAWN AEROBIC AND ANAEROBIC Blood Culture results may not be optimal due to an excessive volume of blood received in culture bottles   Culture   Final    NO GROWTH 4 DAYS Performed at [facility], [address] [place], [place] [address]    Report Status PENDING  Incomplete  MRSA Next Gen by PCR, Nasal      Status: None   Collection Time: 05/22/22 12:26 PM   Specimen: Nasal Mucosa; Nasal Swab  Result Value Ref Range Status   MRSA by PCR Next Gen NOT DETECTED NOT DETECTED Final    Comment: (NOTE) The GeneXpert MRSA Assay (FDA approved for NASAL specimens only), is one component of a comprehensive MRSA colonization surveillance program. It is not intended to diagnose MRSA infection nor to guide or monitor treatment for MRSA infections. Test performance is not FDA approved in patients less than 42 years old. Performed at [facility], [address] [place], [place] [address]   Aerobic/Anaerobic Culture w Gram Stain (surgical/deep wound)     Status: None (Preliminary result)   Collection Time: 05/22/22  4:38 PM   Specimen: Foot, Right; Abscess  Result Value Ref Range Status   Specimen Description   Final    FOOT Performed at [facility], [address] [place], [place] [address]    Special Requests   Final    RT FOOT ABSCESS Performed at [facility], [address] [place], [place] [address]    Gram Stain   Final    RARE WBC PRESENT,BOTH PMN AND MONONUCLEAR RARE GRAM POSITIVE COCCI IN PAIRS IN CLUSTERS Performed at [facility], [place] [address], [place], [place] [address]    Culture   Final    MODERATE METHICILLIN RESISTANT STAPHYLOCOCCUS AUREUS NO ANAEROBES ISOLATED; CULTURE IN PROGRESS FOR 5 DAYS    Report Status PENDING  Incomplete   Organism ID, Bacteria METHICILLIN RESISTANT STAPHYLOCOCCUS AUREUS  Final      Susceptibility   Methicillin resistant staphylococcus aureus - MIC*    CIPROFLOXACIN <=0.5 SENSITIVE Sensitive     ERYTHROMYCIN >=8 RESISTANT Resistant     GENTAMICIN <=0.5 SENSITIVE Sensitive     OXACILLIN >=4 RESISTANT Resistant     TETRACYCLINE <=1 SENSITIVE Sensitive     VANCOMYCIN <=0.5 SENSITIVE Sensitive     TRIMETH/SULFA <=10 SENSITIVE Sensitive     CLINDAMYCIN <=0.25 SENSITIVE Sensitive     RIFAMPIN <=0.5 SENSITIVE  Sensitive     Inducible Clindamycin NEGATIVE Sensitive     * MODERATE METHICILLIN RESISTANT STAPHYLOCOCCUS AUREUS      Radiology Studies last 3 days: No results found.           LOS: 4 days       Emeterio Reeve, DO [facility] 05/26/2022, 1:38 PM    Dictation software may have been used to generate the above note. Typos may occur and escape review in typed/dictated notes. Please contact Dr Sheppard Coil directly for clarity if needed.  Staff may message me via secure chat in [place]  but this may not receive an immediate response,  please page me for urgent matters!  If 7PM-7AM, please contact night coverage [email or web address]

## 2022-05-26 NOTE — Hospital Course (Addendum)
[name] [name] is 57 year old female with history of bipolar disorder, total arthroplasty right shoulder December 2023, chronic pain syndrome, anxiety depression, who presented to emergency room 05/21/22 with altered mental status and redness of the right foot amputation stump, was on Keflex at home, TMA 2 mos prior.  02/02: to ED as above. Septic shock, required brief levo gtt. 02/03: admitted to hospitalist service, underwent I&D w/ podiatry 02/04: stable on IV abx awaiting Cx 02/05: back to OR for revision I&D.   02/06-02/07: stable, planning back to OR 02/08. Cultures from 02/03 (+)MRSA 02/08: to OR for revision of RLE TMA. Per ID, continue vanco and send bone for pathology and culture  02/09: podiatry and ID recs for continue inpatient for now to ensure surgical stabilization and await culture results to guide abx, will likely be here thru the weekend (today is Friday)   02/10: stable, awaiting cultures, podiatry examined this AM see photos, bandages in place per their orders to change dressing at follow-up    Consultants:  Podiatry  Infectious Disease   Procedures: 05/22/22: I&D RLE TMA stump 05/24/22: I&D w/ revision/washout w/ primary delayed closure RLE TMA stump [PLANNED 05/27/22 RLE repeat washout, revisional transmetatarsal amputation and resection of necrotic bone and soft tissue and primary closure of the amputation site per podiatry]      ASSESSMENT & PLAN:   Principal Problem:   Cellulitis of right foot Active Problems:   Severe sepsis ([name])   GERD (gastroesophageal reflux disease)   Essential hypertension   AMS (altered mental status)   Septic shock ([name])   Abscess of right foot  Septic shock - POA - resolved  Right foot cellulitis and abscess at site of prior TMA with history of prior ulceration (+)MRSA NWB RLE  S/p return to the OR on 05/27/2022 for repeat washout, revisional amputation and resection of necrotic bone and soft tissue and primary closure of the  amputation site.  cont IV vanc pending cultures  Podiatry and ID following    Depression anxiety bipolar disorder history of seizure disorder cont Buspar, Cymbalta and Lexapro  cont lamotrigine and Topamax   Chronic pain syndrome recent right shoulder arthroplasty cont home Percocet and Lyrica Per PT discussion with Dr Griffin Basil, FWBing through R UE on the walker   Morbid obesity, BMI 40 Complicates treatment and prognosis  Sleep apnea CPAP nightly     DVT prophylaxis: lovenox   Pertinent IV fluids/nutrition: no continuous IV fluids  Central lines / invasive devices: none  Code Status: FULL CODE  Current Admission Status: inpatient  TOC needs / Dispo plan: none at this time Barriers to discharge / significant pending items: surgery POD2, PT/OT to follow. Podiatry and ID clearance prior to d/c - advise stay thru the weekend to ensure stbilization and await cultures

## 2022-05-26 NOTE — H&P (View-Only) (Signed)
  Subjective:  Patient ID: [name] [name], female    DOB: [birth year],  MRN: [id number]  Seen at bedside POD #2 secondary washout of right foot infection  Negative for chest pain and shortness of breath Fever: no Night sweats: no Constitutional signs: no Review of all other systems is negative Objective:   Vitals:   05/26/22 0829 05/26/22 1500  BP: (!) 145/86 119/78  Pulse: 71 87  Resp: 17 18  Temp: 97.9 F (36.6 C) 97.7 F (36.5 C)  SpO2: 95% 100%   General AA&O x3. Normal mood and affect.  Vascular Dorsalis pedis and posterior tibial pulses 2/4 bilat.    Neurologic Epicritic sensation grossly absent.  Dermatologic No purulence found today.  Cellulitis is essentially resolved.  Some minor necrosis at apex of incisions  Orthopedic: MMT 5/5 in dorsiflexion, plantarflexion, inversion, and eversion. Normal joint ROM without pain or crepitus.   Leukocytosis normalized  Cultures with MRSA Assessment & Plan:  Patient was evaluated and treated and all questions answered.  Right foot infection status post TMA with MRSA -OR tomorrow for revisional TMA and closure -N.p.o. past midnight -Nonweightbearing right lower extremity -Dressing changed today.  Repacked with iodoform -Infectious ease following for antibiotic management, hopeful can have surgical cure of osteomyelitis, will send pathology  [name], DPM  Accessible via secure chat for questions or concerns.

## 2022-05-27 ENCOUNTER — Other Ambulatory Visit: Payer: Self-pay | Admitting: Family Medicine

## 2022-05-27 ENCOUNTER — Encounter
Admission: EM | Disposition: A | Discharge status: Home Health | Payer: Self-pay | Source: Home / Self Care | Attending: Osteopathic Medicine

## 2022-05-27 ENCOUNTER — Other Ambulatory Visit: Payer: Self-pay

## 2022-05-27 ENCOUNTER — Inpatient Hospital Stay: Payer: 59

## 2022-05-27 ENCOUNTER — Inpatient Hospital Stay: Payer: 59 | Admitting: Anesthesiology

## 2022-05-27 DIAGNOSIS — L03115 Cellulitis of right lower limb: Secondary | ICD-10-CM

## 2022-05-27 DIAGNOSIS — L089 Local infection of the skin and subcutaneous tissue, unspecified: Secondary | ICD-10-CM | POA: Insufficient documentation

## 2022-05-27 DIAGNOSIS — L02611 Cutaneous abscess of right foot: Secondary | ICD-10-CM | POA: Diagnosis not present

## 2022-05-27 HISTORY — PX: TRANSMETATARSAL AMPUTATION: SHX6197

## 2022-05-27 LAB — CBC
HCT: 32.1 % — ABNORMAL LOW (ref 36.0–46.0)
Hemoglobin: 10.6 g/dL — ABNORMAL LOW (ref 12.0–15.0)
MCH: 31.6 pg (ref 26.0–34.0)
MCHC: 33 g/dL (ref 30.0–36.0)
MCV: 95.8 fL (ref 80.0–100.0)
Platelets: 309 10*3/uL (ref 150–400)
RBC: 3.35 MIL/uL — ABNORMAL LOW (ref 3.87–5.11)
RDW: 14.2 % (ref 11.5–15.5)
WBC: 12.1 10*3/uL — ABNORMAL HIGH (ref 4.0–10.5)
nRBC: 0 % (ref 0.0–0.2)

## 2022-05-27 LAB — CULTURE, BLOOD (ROUTINE X 2): Culture: NO GROWTH

## 2022-05-27 LAB — BASIC METABOLIC PANEL
Anion gap: 7 (ref 5–15)
BUN: 9 mg/dL (ref 6–20)
CO2: 23 mmol/L (ref 22–32)
Calcium: 8.6 mg/dL — ABNORMAL LOW (ref 8.9–10.3)
Chloride: 106 mmol/L (ref 98–111)
Creatinine, Ser: 0.51 mg/dL (ref 0.44–1.00)
GFR, Estimated: >60 mL/min (ref >60)
Glucose, Bld: 107 mg/dL — ABNORMAL HIGH (ref 70–99)
Potassium: 3.4 mmol/L — ABNORMAL LOW (ref 3.5–5.1)
Sodium: 136 mmol/L (ref 135–145)

## 2022-05-27 LAB — AEROBIC/ANAEROBIC CULTURE W GRAM STAIN (SURGICAL/DEEP WOUND)

## 2022-05-27 LAB — VANCOMYCIN, PEAK: Vancomycin Pk: 27 ug/mL — ABNORMAL LOW (ref 30–40)

## 2022-05-27 LAB — GLUCOSE, CAPILLARY
Glucose-Capillary: 103 mg/dL — ABNORMAL HIGH (ref 70–99)
Glucose-Capillary: 104 mg/dL — ABNORMAL HIGH (ref 70–99)
Glucose-Capillary: 93 mg/dL (ref 70–99)
Glucose-Capillary: 97 mg/dL (ref 70–99)
Glucose-Capillary: 138 mg/dL — ABNORMAL HIGH (ref 70–99)

## 2022-05-27 LAB — MAGNESIUM: Magnesium: 2.3 mg/dL (ref 1.7–2.4)

## 2022-05-27 SURGERY — AMPUTATION, FOOT, TRANSMETATARSAL
Anesthesia: General | Site: Foot | Laterality: Right

## 2022-05-27 MED ORDER — LIDOCAINE HCL (PF) 1 % IJ SOLN
INTRAMUSCULAR | Status: AC
  Filled 2022-05-27: qty 30

## 2022-05-27 MED ORDER — FENTANYL CITRATE (PF) 100 MCG/2ML IJ SOLN
INTRAMUSCULAR | Status: AC
  Filled 2022-05-27: qty 2

## 2022-05-27 MED ORDER — PROPOFOL 500 MG/50ML IV EMUL
INTRAVENOUS | Status: DC | PRN
  Administered 2022-05-27: 100 ug/kg/min via INTRAVENOUS

## 2022-05-27 MED ORDER — FENTANYL CITRATE (PF) 100 MCG/2ML IJ SOLN: 25.0000 ug | INTRAMUSCULAR | Status: DC | PRN

## 2022-05-27 MED ORDER — LACTATED RINGERS IV SOLN: INTRAVENOUS | Status: DC | PRN

## 2022-05-27 MED ORDER — OXYCODONE HCL 5 MG/5ML PO SOLN: 5.0000 mg | Freq: Once | ORAL | Status: DC | PRN

## 2022-05-27 MED ORDER — FENTANYL CITRATE (PF) 100 MCG/2ML IJ SOLN
INTRAMUSCULAR | Status: DC | PRN
  Administered 2022-05-27: 50 ug via INTRAVENOUS

## 2022-05-27 MED ORDER — PROPOFOL 10 MG/ML IV BOLUS
INTRAVENOUS | Status: DC | PRN
  Administered 2022-05-27: 30 mg via INTRAVENOUS

## 2022-05-27 MED ORDER — MIDAZOLAM HCL 2 MG/2ML IJ SOLN
INTRAMUSCULAR | Status: DC | PRN
  Administered 2022-05-27: 2 mg via INTRAVENOUS

## 2022-05-27 MED ORDER — KETAMINE HCL 10 MG/ML IJ SOLN
INTRAMUSCULAR | Status: DC | PRN
  Administered 2022-05-27: 30 mg via INTRAVENOUS

## 2022-05-27 MED ORDER — KETAMINE HCL 50 MG/5ML IJ SOSY
PREFILLED_SYRINGE | INTRAMUSCULAR | Status: AC
  Filled 2022-05-27: qty 5

## 2022-05-27 MED ORDER — BUPIVACAINE HCL (PF) 0.5 % IJ SOLN
INTRAMUSCULAR | Status: AC
  Filled 2022-05-27: qty 30

## 2022-05-27 MED ORDER — MIDAZOLAM HCL 2 MG/2ML IJ SOLN
INTRAMUSCULAR | Status: AC
  Filled 2022-05-27: qty 2

## 2022-05-27 MED ORDER — OXYCODONE HCL 5 MG PO TABS: 5.0000 mg | ORAL_TABLET | Freq: Once | ORAL | Status: DC | PRN

## 2022-05-27 SURGICAL SUPPLY — 50 items
BLADE MED AGGRESSIVE (BLADE) IMPLANT
BLADE SURG 10 STRL SS SAFETY (BLADE) IMPLANT
BNDG CMPR 5X4 CHSV STRCH STRL (GAUZE/BANDAGES/DRESSINGS) ×1
BNDG CMPR 75X41 PLY HI ABS (GAUZE/BANDAGES/DRESSINGS) ×1
BNDG CMPR STD VLCR NS LF 5.8X4 (GAUZE/BANDAGES/DRESSINGS) ×1
BNDG COHESIVE 4X5 TAN STRL LF (GAUZE/BANDAGES/DRESSINGS) ×2 IMPLANT
BNDG ELASTIC 4X5.8 VLCR NS LF (GAUZE/BANDAGES/DRESSINGS) ×2 IMPLANT
BNDG ESMARCH 4 X 12 STRL LF (GAUZE/BANDAGES/DRESSINGS) ×1
BNDG ESMARCH 4X12 STRL LF (GAUZE/BANDAGES/DRESSINGS) ×2 IMPLANT
BNDG GAUZE DERMACEA FLUFF 4 (GAUZE/BANDAGES/DRESSINGS) ×2 IMPLANT
BNDG GZE DERMACEA 4 6PLY (GAUZE/BANDAGES/DRESSINGS) ×1
BNDG STRETCH 4X75 STRL LF (GAUZE/BANDAGES/DRESSINGS) ×2 IMPLANT
CUFF TOURN SGL QUICK 18X4 (TOURNIQUET CUFF) ×2 IMPLANT
CUFF TOURN SGL QUICK 24 (TOURNIQUET CUFF)
CUFF TRNQT CYL 24X4X16.5-23 (TOURNIQUET CUFF) ×2 IMPLANT
DRAIN PENROSE 12X.25 LTX STRL (MISCELLANEOUS) IMPLANT
DRAPE FLUOR MINI C-ARM 54X84 (DRAPES) IMPLANT
DRSG EMULSION OIL 3X8 NADH (GAUZE/BANDAGES/DRESSINGS) IMPLANT
DURAPREP 26ML APPLICATOR (WOUND CARE) ×2 IMPLANT
ELECT REM PT RETURN 9FT ADLT (ELECTROSURGICAL) ×1
ELECTRODE REM PT RTRN 9FT ADLT (ELECTROSURGICAL) ×2 IMPLANT
GAUZE PACKING IODOFORM 1/2INX (GAUZE/BANDAGES/DRESSINGS) IMPLANT
GAUZE SPONGE 4X4 12PLY STRL (GAUZE/BANDAGES/DRESSINGS) ×4 IMPLANT
GLOVE BIOGEL PI IND STRL 8 (GLOVE) ×2 IMPLANT
GLOVE SURG LX STRL 8.0 MICRO (GLOVE) ×2 IMPLANT
GOWN STRL REUS W/ TWL XL LVL3 (GOWN DISPOSABLE) ×2 IMPLANT
GOWN STRL REUS W/TWL XL LVL3 (GOWN DISPOSABLE) ×1
HANDLE YANKAUER SUCT BULB TIP (MISCELLANEOUS) IMPLANT
KIT TURNOVER KIT A (KITS) ×2 IMPLANT
LABEL OR SOLS (LABEL) IMPLANT
MANIFOLD NEPTUNE II (INSTRUMENTS) ×2 IMPLANT
NDL FILTER BLUNT 18X1 1/2 (NEEDLE) ×2 IMPLANT
NDL SAFETY ECLIP 18X1.5 (MISCELLANEOUS) ×2 IMPLANT
NEEDLE FILTER BLUNT 18X1 1/2 (NEEDLE) ×1 IMPLANT
NS IRRIG 500ML POUR BTL (IV SOLUTION) ×2 IMPLANT
PACK EXTREMITY ARMC (MISCELLANEOUS) ×2 IMPLANT
PAD ABD DERMACEA PRESS 5X9 (GAUZE/BANDAGES/DRESSINGS) ×4 IMPLANT
PULSAVAC PLUS IRRIG FAN TIP (DISPOSABLE) ×1
SOL PREP PVP 2OZ (MISCELLANEOUS) ×1
SOLUTION PREP PVP 2OZ (MISCELLANEOUS) ×2 IMPLANT
SPONGE T-LAP 18X18 ~~LOC~~+RFID (SPONGE) IMPLANT
STAPLER SKIN PROX 35W (STAPLE) IMPLANT
STOCKINETTE M/LG 89821 (MISCELLANEOUS) ×2 IMPLANT
SUT ETHILON 3 0 FSLX (SUTURE) IMPLANT
SUT PROLENE 3 0 PS 2 (SUTURE) IMPLANT
SWAB CULTURE AMIES ANAERIB BLU (MISCELLANEOUS) IMPLANT
SYR 10ML LL (SYRINGE) ×4 IMPLANT
TIP FAN IRRIG PULSAVAC PLUS (DISPOSABLE) IMPLANT
TRAP FLUID SMOKE EVACUATOR (MISCELLANEOUS) ×2 IMPLANT
WATER STERILE IRR 500ML POUR (IV SOLUTION) ×2 IMPLANT

## 2022-05-27 NOTE — Op Note (Signed)
OPERATIVE REPORT Patient name: [name] [name] MRN: [id number] DOB: Jul 02, 1965  DOS: 05/27/22  Preop Dx: Open amputation stump right foot.  Osteomyelitis right foot Postop Dx: same  Procedure:  1.  Partial excision of metatarsal bones left foot x 3 2.  Delayed primary closure/secondary closure of amputation stump left foot  Surgeon: [name] DPM  Anesthesia: Monitored anesthesia care  Hemostasis: Calf tourniquet inflated to a pressure of 227mHg without esmarch exsanguination   EBL: 20 mL Materials: None Injectables: None.  Patient completely neuropathic Pathology: Bone biopsy first metatarsal left foot sent for BOTH culture and path  Condition: The patient tolerated the procedure and anesthesia well. No complications noted or reported   Justification for procedure: The patient is a 57y.o. female with prior history of transmetatarsal amputation which secondarily became infected and has underwent repeat incision and drainage of the foot who presents today for surgical correction of the open amputation stump. The patient was told benefits as well as possible side effects of the surgery. The patient consented for surgical correction. The patient consent form was reviewed. All patient questions were answered. No guarantees were expressed or implied. The patient and the surgeon both signed the patient consent form with the witness present and placed in the patient's chart.   Procedure in Detail: The patient was brought to the operating room, placed in the operating table in the supine position at which time an aseptic scrub and drape were performed about the patient's respective lower extremity after anesthesia was induced as described above. Attention was then directed to the surgical area where procedure number one commenced.  Procedure #1: Partial excision of metatarsal bones 1-3 right foot Prior to initiating the procedure the retention sutures and staples within the  amputation stump were removed.  Copious irrigation with pulse lavage and 3 L of normal saline was also performed.  Any additional necrotic nonviable tissue was resected away down to healthier tissue using a #15 scalpel.  At this time a sagittal saw mounted sagittal blade was utilized to create osteotomies to the distal portions of the metatarsals 1-3 of the surgical foot to allow for primary closure of the amputation stump.  The metatarsals were resected and this was verified by intraoperative x-ray fluoroscopy.  The most distal portion of the first metatarsal was harvested and sent in 2 separate specimens for path and bone biopsy.  After appropriate resection of the metatarsals to allow for the skin flaps and primary closure attention was directed to the soft tissue where procedure #2 commenced  Procedure #2: Delayed primary closure/secondary closure of amputation stump right foot The edges of the skin flaps of the amputation site were freshened using a #15 scalpel and in the necrotic nonviable edges of the tissue were resected away and removed.  Healthy viable margins of the skin flaps were noted and primary closure was achieved using a combination of wide stainless steel skin staples and reinforced with 3-0 nylon suture.  The small percutaneous incisions along the foot from prior incision and drainage were also primarily closed using 3-0 nylon suture.  Dry sterile compressive dressings were then applied to all previously mentioned incision sites about the patient's lower extremity. The tourniquet which was used for hemostasis was deflated.  The patient was then transferred from the operating room to the recovery room having tolerated the procedure and anesthesia well. All vital signs are stable. After a brief stay in the recovery room the patient was readmitted to inpatient room with  postoperative orders placed.    IMPRESSION: Overall improvement.  Primary closure achieved.  Patient continues moderate to  high risk of more proximal amputation  [name], DPM [facility]  Dr. [name], DPM    [address], [place] [address]                Office [phone]  Fax [phone]

## 2022-05-27 NOTE — Brief Op Note (Signed)
05/21/2022 - 05/27/2022  4:34 PM  PATIENT:  [name] [name]  57 y.o. 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  PROCEDURE:  Procedure(s): REVISION TRANSMETATARSAL AMPUTATION WITH DELAYED PRIMARY CLOSURE (Right)  SURGEON:  Surgeon(s) and Role:    [name], DPM - Primary  PHYSICIAN ASSISTANT:   ASSISTANTS: none   ANESTHESIA:   MAC  EBL:  34m   BLOOD ADMINISTERED:none  DRAINS: none   LOCAL MEDICATIONS USED:  NONE  SPECIMEN:  Source of Specimen:  Bone biopsy first metatarsal sent for BOTH path and culture  DISPOSITION OF SPECIMEN:  PATHOLOGY  COUNTS:  YES  TOURNIQUET:   Total Tourniquet Time Documented: Calf (Right) - 35 minutes Total: Calf (Right) - 35 minutes   DICTATION: .DViviann SpareDictation  PLAN OF CARE: Admit to inpatient   PATIENT DISPOSITION:  PACU - hemodynamically stable.   Delay start of Pharmacological VTE agent (>24hrs) due to surgical blood loss or risk of bleeding: not applicable

## 2022-05-27 NOTE — Anesthesia Preprocedure Evaluation (Signed)
Anesthesia Evaluation  Patient identified by MRN, date of birth, ID band Patient awake    Reviewed: Allergy & Precautions, NPO status , Patient's Chart, lab work & pertinent test results  History of Anesthesia Complications Negative for: history of anesthetic complications  Airway Mallampati: III  TM Distance: <3 FB Neck ROM: full    Dental  (+) Chipped   Pulmonary asthma , sleep apnea    Pulmonary exam normal        Cardiovascular Exercise Tolerance: Good hypertension, (-) angina negative cardio ROS Normal cardiovascular exam     Neuro/Psych  Headaches, Seizures -,  PSYCHIATRIC DISORDERS         GI/Hepatic Neg liver ROS,GERD  Controlled,,  Endo/Other  negative endocrine ROS    Renal/GU Renal disease  negative genitourinary   Musculoskeletal   Abdominal   Peds  Hematology negative hematology ROS (+)   Anesthesia Other Findings Past Medical History: No date: Anxiety No date: Arthritis     Comment:  [employer] 02/08/2020 No date: Asthma     Comment:  mild intermittent No date: Asthma     Comment:  [employer] 02/08/2020 No date: Bipolar 1 disorder ([place])     Comment:  ect treatments last treatment Sep 02 1011 No date: Depression No date: Depression     Comment:  [employer] 02/08/2020 No date: Depression     Comment:  [employer] 07/10/2020 No date: GERD (gastroesophageal reflux disease) No date: Headache No date: Hypertension No date: Pre-diabetes No date: Seizures ([place])     Comment:  Thinks it was from taking Tramadol No date: Sleep apnea     Comment:  wears CPAP, uncertain of setting No date: Substance abuse [facility])     Comment:  [employer] 02/08/2020  Past Surgical History: No date: ABDOMINAL HYSTERECTOMY 02/02/2018: AMPUTATION TOE; Left     Comment:  Procedure: AMPUTATION TOE Left 4th toe;  Surgeon:               [name], DPM;  Location: [place];  Service:               Podiatry;  Laterality:  Left; No date: APPENDECTOMY; N/A     Comment:  [employer] 02/08/2020 2018: BACK SURGERY     Comment:  ACDF   C5-6 & C6-7 by Dr. Rennis Harding No date: [place]:  x2 05/22/2022: I & D EXTREMITY; Right     Comment:  Procedure: IRRIGATION AND DEBRIDEMENT EXTREMITY;                Surgeon: Felipa Furnace, DPM;  Location: [facility];                Service: Podiatry;  Laterality: Right; 10/25/2020: IRRIGATION AND DEBRIDEMENT ABSCESS; Right     Comment:  Procedure: IRRIGATION AND DEBRIDEMENT ABSCESS OF FOOT               AND APPLICATION OF GRAFT;  Surgeon: [name],               DPM;  Location: [place];  Service: Podiatry;  Laterality:               Right; No date: Laproscopic knee surgery; Right 03/15/2017: NECK SURGERY     Comment:  ACDF   by Dr. Patrice Paradise No date: PLANTAR FASCIA RELEASE     Comment:  x2 04/14/2022: REVERSE SHOULDER ARTHROPLASTY; Right     Comment:  Procedure: REVERSE SHOULDER ARTHROPLASTY;  Surgeon:               Hiram Gash, MD;  Location: [facility];  Service:               Orthopedics;  Laterality: Right; 05/18/2019: TRANSMETATARSAL AMPUTATION; Right     Comment:  Procedure: TRANSMETATARSAL AMPUTATION;  Surgeon:               [name], DPM;  Location: [facility];  Service:               Podiatry;  Laterality: Right;  BMI    Body Mass Index: 40.87 kg/m      Reproductive/Obstetrics negative OB ROS                             Anesthesia Physical Anesthesia Plan  ASA: 3  Anesthesia Plan: General   Post-op Pain Management:    Induction: Intravenous  PONV Risk Score and Plan: 3 and Propofol infusion and TIVA  Airway Management Planned: Natural Airway and Nasal Cannula  Additional Equipment:   Intra-op Plan:   Post-operative Plan:   Informed Consent: I have reviewed the patients History and Physical, chart, labs and discussed the procedure including the risks, benefits and alternatives for the  proposed anesthesia with the patient or authorized representative who has indicated his/her understanding and acceptance.     Dental Advisory Given  Plan Discussed with: Anesthesiologist, CRNA and Surgeon  Anesthesia Plan Comments: (Patient consented for risks of anesthesia including but not limited to:  - adverse reactions to medications - risk of airway placement if required - damage to eyes, teeth, lips or other oral mucosa - nerve damage due to positioning  - sore throat or hoarseness - Damage to heart, brain, nerves, lungs, other parts of body or loss of life  Patient voiced understanding.)       Anesthesia Quick Evaluation

## 2022-05-27 NOTE — Transfer of Care (Signed)
Immediate Anesthesia Transfer of Care Note  Patient: [name] [name]  Procedure(s) Performed: REVISION TRANSMETATARSAL AMPUTATION WITH DELAYED PRIMARY CLOSURE (Right: Foot)  Patient Location: PACU  Anesthesia Type:General  Level of Consciousness: drowsy  Airway & Oxygen Therapy: Patient Spontanous Breathing and Patient connected to face mask oxygen  Post-op Assessment: Report given to RN and Post -op Vital signs reviewed and stable  Post vital signs: Reviewed and stable  Last Vitals:  Vitals Value Taken Time  BP 112/75 05/27/22 1635  Temp    Pulse 70 05/27/22 1639  Resp 11 05/27/22 1639  SpO2 100 % 05/27/22 1639  Vitals shown include unvalidated device data.  Last Pain:  Vitals:   05/27/22 1457  TempSrc: Temporal  PainSc: 0-No pain      Patients Stated Pain Goal: 0 (44/81/85 6314)  Complications: No notable events documented.

## 2022-05-27 NOTE — Progress Notes (Signed)
PT Cancellation Note  Patient Details Name: TANAE PETROSKY MRN: [id number] DOB: [birth year]   Cancelled Treatment:    Reason Eval/Treat Not Completed: Other (comment) Out of room for amputation revision this date, deferred PT intervention.   Kreg Shropshire, DPT 05/27/2022, 3:41 PM

## 2022-05-27 NOTE — Plan of Care (Signed)
  Problem: Education: Goal: Knowledge of General Education information will improve Description: Including pain rating scale, medication(s)/side effects and non-pharmacologic comfort measures Outcome: Progressing   Problem: Activity: Goal: Risk for activity intolerance will decrease Outcome: Progressing   Problem: Pain Managment: Goal: General experience of comfort will improve Outcome: Progressing   

## 2022-05-27 NOTE — Progress Notes (Addendum)
1010 Per Dr Sheppard Coil ok to give pt morning meds with sips of water.   1429 Dressing to right foot being changed by ID  1440 Pt being transported to Pre op at this time

## 2022-05-27 NOTE — Plan of Care (Signed)

## 2022-05-27 NOTE — Progress Notes (Signed)
   Date of Admission:  05/21/2022     ID: [name] [name] is a 57 y.o. female  Principal Problem:   Cellulitis of right foot Active Problems:   GERD (gastroesophageal reflux disease)   Essential hypertension   Severe sepsis ([name])   AMS (altered mental status)   Septic shock ([name])   Abscess of right foot   Morbid obesity ([facility])  Pt with multiple fusion surgeries to the spine, rt reverse shoulder arthroplasty, rt TMA was admitted for altered mental status and infection at the TMA site- CT scan did no show any osteo underwent I/D on 05/22/22 MRSA in culture ( previously it had been MSSA) She is going back for revision TMA today   Subjective: Says she is doing okay Pain okay  Medications:   aspirin  81 mg Oral BID   busPIRone  15 mg Oral TID   Chlorhexidine Gluconate Cloth  6 each Topical Q0600   DULoxetine  20 mg Oral Daily   enoxaparin (LOVENOX) injection  0.5 mg/kg Subcutaneous Q24H   escitalopram  10 mg Oral Daily   estradiol  1 mg Oral QHS   insulin aspart  0-6 Units Subcutaneous TID WC   lamoTRIgine  100 mg Oral Daily   naloxegol oxalate  25 mg Oral Daily   pregabalin  100 mg Oral TID   sodium chloride flush  3 mL Intravenous Q12H   topiramate  50 mg Oral BID    Objective: Vital signs in last 24 hours: Patient Vitals for the past 24 hrs:  BP Temp Temp src Pulse Resp SpO2  05/27/22 1220 133/74 98 F (36.7 C) Oral 77 17 95 %  05/27/22 0738 (!) 142/85 98.1 F (36.7 C) -- 73 17 97 %  05/26/22 2333 (!) 140/79 98.6 F (37 C) Oral 72 20 97 %  05/26/22 1500 119/78 97.7 F (36.5 C) Oral 87 18 100 %       PHYSICAL EXAM:  General: Alert, cooperative, no distress, appears stated age.  Lungs: Clear to auscultation bilaterally. No Wheezing or Rhonchi. No rales. Heart: Regular rate and rhythm, no murmur, rub or gallop. Abdomen: Soft, non-tender,not distended. Bowel sounds normal. No masses Extremities: rt TMA site- some slough  Skin: No rashes or lesions. Or  bruising Lymph: Cervical, supraclavicular normal. Neurologic: Grossly non-focal  Lab Results Recent Labs    05/26/22 0147 05/27/22 0144  WBC 10.5 12.1*  HGB 10.8* 10.6*  HCT 33.2* 32.1*  NA 140 136  K 3.9 3.4*  CL 105 106  CO2 22 23  BUN 10 9  CREATININE 0.55 0.51    Microbiology: MRSA Studies/Results: Xray rt foot- no osteo   Assessment/Plan: Rt TMA site infection- underwent I/D and has MRSA infection- on vanco Today going back for revision TMA- spoke to Dr.Evans to send bone  for pathology and culture   H/o multiple surgeries to spine  Depression/anxiety on meds  Discussed the management with the patient and care team

## 2022-05-27 NOTE — Progress Notes (Signed)
PROGRESS NOTE    [name] [name]   WUJ:[id number] DOB: 1965/06/12  DOA: 05/21/2022 Date of Service: 05/27/22 PCP: [name], MD     Brief Narrative / Hospital Course:  [name] [name] is 57 year old female with history of bipolar disorder, total arthroplasty right shoulder December 2023, chronic pain syndrome, anxiety depression, who presented to emergency room 05/21/22 with altered mental status and redness of the right foot amputation stump, was on Keflex at home, TMA 2 mos prior.  02/02: to ED as above. Septic shock, required brief levo gtt. 02/03: admitted to hospitalist service, underwent I&D w/ podiatry 02/04: stable on IV abx awaiting Cx 02/05: back to OR for revision I&D.   02/06-02/07: stable, planning back to OR 02/08. Cultures from 02/03 (+)MRSA 02/08: to OR for revision of RLE TMA   Consultants:  Podiatry  Infectious Disease   Procedures: 05/22/22: I&D RLE TMA stump 05/24/22: I&D w/ revision/washout w/ primary delayed closure RLE TMA stump [PLANNED 05/27/22 RLE repeat washout, revisional transmetatarsal amputation and resection of necrotic bone and soft tissue and primary closure of the amputation site per podiatry]      ASSESSMENT & PLAN:   Principal Problem:   Cellulitis of right foot Active Problems:   Severe sepsis ([place])   GERD (gastroesophageal reflux disease)   Essential hypertension   AMS (altered mental status)   Septic shock ([name])   Abscess of right foot  Septic shock - POA - resolved  Right foot cellulitis and abscess at site of prior TMA with history of prior ulceration (+)MRSA NWB RLE  plan return to the OR on 05/27/2022 for repeat washout, revisional amputation and resection of necrotic bone and soft tissue and primary closure of the amputation site.  cont IV vanc  ID following    Depression anxiety bipolar disorder history of seizure disorder cont Buspar, Cymbalta and Lexapro  cont lamotrigine and Topamax   Chronic pain  syndrome recent right shoulder arthroplasty cont home Percocet and Lyrica Per PT discussion with Dr Griffin Basil, FWBing through R UE on the walker   Morbid obesity, BMI 40 Complicates treatment and prognosis  Sleep apnea CPAP nightly     DVT prophylaxis: lovenox   Pertinent IV fluids/nutrition: no continuous IV fluids  Central lines / invasive devices: none  Code Status: FULL CODE  Current Admission Status: inpatient  TOC needs / Dispo plan: none at this time, may need SNF on eventual discharge but pt wishes to go home Barriers to discharge / significant pending items: surgery today, PT/OT to follow, expect will need at least [facility]. Podiatry and ID clearance prior to d/c             Subjective / Brief ROS:  Patient reports no concerns at this time Denies CP/SOB. Pain controlled though foot is sore Denies new weakness.  Family Communication: none at this time     Objective Findings:  Vitals:   05/26/22 1500 05/26/22 2333 05/27/22 0738 05/27/22 1220  BP: 119/78 (!) 140/79 (!) 142/85 133/74  Pulse: 87 72 73 77  Resp: '18 20 17 17  '$ Temp: 97.7 F (36.5 C) 98.6 F (37 C) 98.1 F (36.7 C) 98 F (36.7 C)  TempSrc: Oral Oral  Oral  SpO2: 100% 97% 97% 95%  Weight:      Height:        Intake/Output Summary (Last 24 hours) at 05/27/2022 1410 Last data filed at 05/27/2022 1004 Gross per 24 hour  Intake 20 ml  Output --  Net  20 ml   Filed Weights   05/21/22 2344 05/22/22 1221 05/24/22 1637  Weight: 107.4 kg 108.1 kg 108 kg    Examination:  Physical Exam Constitutional:      Appearance: She is obese.  Cardiovascular:     Rate and Rhythm: Normal rate and regular rhythm.     Heart sounds: Normal heart sounds.  Pulmonary:     Effort: Pulmonary effort is normal.     Breath sounds: Normal breath sounds.  Musculoskeletal:     Right lower leg: No edema (R foot bandage - clean, was not removed).     Left lower leg: No edema.  Neurological:     Mental Status: She  is alert.          Scheduled Medications:   aspirin  81 mg Oral BID   busPIRone  15 mg Oral TID   Chlorhexidine Gluconate Cloth  6 each Topical Q0600   DULoxetine  20 mg Oral Daily   enoxaparin (LOVENOX) injection  0.5 mg/kg Subcutaneous Q24H   escitalopram  10 mg Oral Daily   estradiol  1 mg Oral QHS   insulin aspart  0-6 Units Subcutaneous TID WC   lamoTRIgine  100 mg Oral Daily   naloxegol oxalate  25 mg Oral Daily   pregabalin  100 mg Oral TID   sodium chloride flush  3 mL Intravenous Q12H   topiramate  50 mg Oral BID    Continuous Infusions:  sodium chloride Stopped (05/24/22 1920)   vancomycin 1,500 mg (05/27/22 1004)    PRN Medications:  acetaminophen **OR** acetaminophen, albuterol, ondansetron (ZOFRAN) IV, mouth rinse, oxyCODONE-acetaminophen  Antimicrobials from admission:  Anti-infectives (From admission, onward)    Start     Dose/Rate Route Frequency Ordered Stop   05/25/22 1000  vancomycin (VANCOREADY) IVPB 1500 mg/300 mL        1,500 mg 150 mL/hr over 120 Minutes Intravenous Every 24 hours 05/25/22 0903     05/24/22 1810  vancomycin (VANCOCIN) powder  Status:  Discontinued          As needed 05/24/22 1810 05/24/22 1824   05/24/22 1230  Ampicillin-Sulbactam (UNASYN) 3 g in sodium chloride 0.9 % 100 mL IVPB  Status:  Discontinued        3 g 200 mL/hr over 30 Minutes Intravenous Every 6 hours 05/24/22 1142 05/26/22 1040   05/23/22 0000  cefTRIAXone (ROCEPHIN) 2 g in sodium chloride 0.9 % 100 mL IVPB  Status:  Discontinued        2 g 200 mL/hr over 30 Minutes Intravenous Every 24 hours 05/22/22 0127 05/22/22 1139   05/22/22 1400  clindamycin (CLEOCIN) IVPB 600 mg  Status:  Discontinued        600 mg 100 mL/hr over 30 Minutes Intravenous Every 8 hours 05/22/22 1142 05/23/22 1311   05/22/22 1300  meropenem (MERREM) 1 g in sodium chloride 0.9 % 100 mL IVPB  Status:  Discontinued        1 g 200 mL/hr over 30 Minutes Intravenous Every 8 hours 05/22/22 1219  05/24/22 1142   05/22/22 0300  vancomycin (VANCOREADY) IVPB 1500 mg/300 mL  Status:  Discontinued        1,500 mg 150 mL/hr over 120 Minutes Intravenous Every 24 hours 05/22/22 0209 05/24/22 1235   05/22/22 0000  cefTRIAXone (ROCEPHIN) 2 g in sodium chloride 0.9 % 100 mL IVPB        2 g 200 mL/hr over 30 Minutes Intravenous STAT 05/21/22 2351  05/22/22 0110   05/21/22 2345  vancomycin (VANCOCIN) IVPB 1000 mg/200 mL premix        1,000 mg 200 mL/hr over 60 Minutes Intravenous  Once 05/21/22 2340 05/22/22 0110   05/21/22 2345  aztreonam (AZACTAM) 2 g in sodium chloride 0.9 % 100 mL IVPB  Status:  Discontinued        2 g 200 mL/hr over 30 Minutes Intravenous  Once 05/21/22 2340 05/21/22 2351           Data Reviewed:  I have personally reviewed the following...  CBC: Recent Labs  Lab 05/21/22 2339 05/22/22 0203 05/23/22 0929 05/24/22 0231 05/25/22 0256 05/26/22 0147 05/27/22 0144  WBC 19.0*   < > 19.9* 17.4* 11.3* 10.5 12.1*  NEUTROABS 17.0*  --   --   --   --   --   --   HGB 12.0   < > 10.0* 9.6* 10.0* 10.8* 10.6*  HCT 37.0   < > 30.4* 28.7* 30.6* 33.2* 32.1*  MCV 97.4   < > 95.6 95.3 95.9 96.5 95.8  PLT 239   < > 206 263 254 296 309   < > = values in this interval not displayed.   Basic Metabolic Panel: Recent Labs  Lab 05/23/22 0929 05/24/22 0231 05/25/22 0256 05/26/22 0147 05/27/22 0144  NA 134* 136 134* 140 136  K 3.8 3.7 3.7 3.9 3.4*  CL 108 107 103 105 106  CO2 19* 20* '26 22 23  '$ GLUCOSE 148* 109* 88 96 107*  BUN '13 16 12 10 9  '$ CREATININE 0.56 0.62 0.69 0.55 0.51  CALCIUM 8.6* 8.5* 8.3* 8.5* 8.6*  MG 2.1 2.2 2.0 2.2 2.3   GFR: Estimated Creatinine Clearance: 92 mL/min (by C-G formula based on SCr of 0.51 mg/dL). Liver Function Tests: Recent Labs  Lab 05/21/22 2339 05/22/22 0203  AST 17 18  ALT 12 10  ALKPHOS 85 71  BILITOT 0.8 0.7  PROT 7.5 5.8*  ALBUMIN 3.4* 2.7*   Recent Labs  Lab 05/21/22 2339  LIPASE 25   No results for input(s):  "AMMONIA" in the last 168 hours. Coagulation Profile: Recent Labs  Lab 05/21/22 2339  INR 1.2   Cardiac Enzymes: No results for input(s): "CKTOTAL", "CKMB", "CKMBINDEX", "TROPONINI" in the last 168 hours. BNP (last 3 results) No results for input(s): "PROBNP" in the last 8760 hours. HbA1C: No results for input(s): "HGBA1C" in the last 72 hours. CBG: Recent Labs  Lab 05/26/22 1124 05/26/22 1644 05/26/22 2105 05/27/22 0739 05/27/22 1140  GLUCAP 103* 106* 104* 103* 104*   Lipid Profile: No results for input(s): "CHOL", "HDL", "LDLCALC", "TRIG", "CHOLHDL", "LDLDIRECT" in the last 72 hours. Thyroid Function Tests: No results for input(s): "TSH", "T4TOTAL", "FREET4", "T3FREE", "THYROIDAB" in the last 72 hours. Anemia Panel: No results for input(s): "VITAMINB12", "FOLATE", "FERRITIN", "TIBC", "IRON", "RETICCTPCT" in the last 72 hours. Most Recent Urinalysis On File:     Component Value Date/Time   COLORURINE YELLOW (A) 05/22/2022 0530   APPEARANCEUR CLEAR (A) 05/22/2022 0530   LABSPEC 1.005 05/22/2022 0530   PHURINE 6.0 05/22/2022 0530   GLUCOSEU NEGATIVE 05/22/2022 0530   HGBUR NEGATIVE 05/22/2022 0530   BILIRUBINUR NEGATIVE 05/22/2022 0530   KETONESUR NEGATIVE 05/22/2022 0530   PROTEINUR NEGATIVE 05/22/2022 0530   UROBILINOGEN 0.2 06/23/2017 1108   NITRITE NEGATIVE 05/22/2022 0530   LEUKOCYTESUR NEGATIVE 05/22/2022 0530   Sepsis Labs: '@LABRCNTIP'$ (procalcitonin:4,lacticidven:4) Microbiology: Recent Results (from the past 240 hour(s))  Resp panel by RT-PCR (RSV, Flu  A&B, Covid) Anterior Nasal Swab     Status: None   Collection Time: 05/21/22 11:39 PM   Specimen: Anterior Nasal Swab  Result Value Ref Range Status   SARS Coronavirus 2 by RT PCR NEGATIVE NEGATIVE Final    Comment: (NOTE) SARS-CoV-2 target nucleic acids are NOT DETECTED.  The SARS-CoV-2 RNA is generally detectable in upper respiratory specimens during the acute phase of infection. The  lowest concentration of SARS-CoV-2 viral copies this assay can detect is 138 copies/mL. A negative result does not preclude SARS-Cov-2 infection and should not be used as the sole basis for treatment or other patient management decisions. A negative result may occur with  improper specimen collection/handling, submission of specimen other than nasopharyngeal swab, presence of viral mutation(s) within the areas targeted by this assay, and inadequate number of viral copies(<138 copies/mL). A negative result must be combined with clinical observations, patient history, and epidemiological information. The expected result is Negative.  Fact Sheet for Patients:  EntrepreneurPulse.com.au  Fact Sheet for Healthcare Providers:  IncredibleEmployment.be  This test is no t yet approved or cleared by the [place] FDA and  has been authorized for detection and/or diagnosis of SARS-CoV-2 by FDA under an Emergency Use Authorization (EUA). This EUA will remain  in effect (meaning this test can be used) for the duration of the COVID-19 declaration under Section 564(b)(1) of the Act, 21 U.S.C.section 360bbb-3(b)(1), unless the authorization is terminated  or revoked sooner.       Influenza A by PCR NEGATIVE NEGATIVE Final   Influenza B by PCR NEGATIVE NEGATIVE Final    Comment: (NOTE) The Xpert Xpress SARS-CoV-2/FLU/RSV plus assay is intended as an aid in the diagnosis of influenza from Nasopharyngeal swab specimens and should not be used as a sole basis for treatment. Nasal washings and aspirates are unacceptable for Xpert Xpress SARS-CoV-2/FLU/RSV testing.  Fact Sheet for Patients: EntrepreneurPulse.com.au  Fact Sheet for Healthcare Providers: IncredibleEmployment.be  This test is not yet approved or cleared by the [place] FDA and has been authorized for detection and/or diagnosis of SARS-CoV-2 by FDA under  an Emergency Use Authorization (EUA). This EUA will remain in effect (meaning this test can be used) for the duration of the COVID-19 declaration under Section 564(b)(1) of the Act, 21 U.S.C. section 360bbb-3(b)(1), unless the authorization is terminated or revoked.     Resp Syncytial Virus by PCR NEGATIVE NEGATIVE Final    Comment: (NOTE) Fact Sheet for Patients: EntrepreneurPulse.com.au  Fact Sheet for Healthcare Providers: IncredibleEmployment.be  This test is not yet approved or cleared by the [place] FDA and has been authorized for detection and/or diagnosis of SARS-CoV-2 by FDA under an Emergency Use Authorization (EUA). This EUA will remain in effect (meaning this test can be used) for the duration of the COVID-19 declaration under Section 564(b)(1) of the Act, 21 U.S.C. section 360bbb-3(b)(1), unless the authorization is terminated or revoked.  Performed at [facility], [address] [place], [place] [address]   Blood Culture (routine x 2)     Status: None   Collection Time: 05/21/22 11:39 PM   Specimen: BLOOD  Result Value Ref Range Status   Specimen Description BLOOD LEFT ANTECUBITAL  Final   Special Requests   Final    BOTTLES DRAWN AEROBIC AND ANAEROBIC Blood Culture adequate volume   Culture   Final    NO GROWTH 5 DAYS Performed at [facility], [address] [place], [place] [address]    Report Status 05/26/2022  FINAL  Final  Blood Culture (routine x 2)     Status: None   Collection Time: 05/21/22 11:44 PM   Specimen: BLOOD  Result Value Ref Range Status   Specimen Description BLOOD RIGHT ANTECUBITAL  Final   Special Requests   Final    BOTTLES DRAWN AEROBIC AND ANAEROBIC Blood Culture results may not be optimal due to an excessive volume of blood received in culture bottles   Culture   Final    NO GROWTH 5 DAYS Performed at [facility], [address], [place], [place]  [address]    Report Status 05/27/2022 FINAL  Final  MRSA Next Gen by PCR, Nasal     Status: None   Collection Time: 05/22/22 12:26 PM   Specimen: Nasal Mucosa; Nasal Swab  Result Value Ref Range Status   MRSA by PCR Next Gen NOT DETECTED NOT DETECTED Final    Comment: (NOTE) The GeneXpert MRSA Assay (FDA approved for NASAL specimens only), is one component of a comprehensive MRSA colonization surveillance program. It is not intended to diagnose MRSA infection nor to guide or monitor treatment for MRSA infections. Test performance is not FDA approved in patients less than 34 years old. Performed at [facility], [address] [place], [place] [address]   Aerobic/Anaerobic Culture w Gram Stain (surgical/deep wound)     Status: None   Collection Time: 05/22/22  4:38 PM   Specimen: Foot, Right; Abscess  Result Value Ref Range Status   Specimen Description   Final    FOOT Performed at [facility], [address], [place], [place] [address]    Special Requests   Final    RT FOOT ABSCESS Performed at [facility], [address] [place], [place] [address]    Gram Stain   Final    RARE WBC PRESENT,BOTH PMN AND MONONUCLEAR RARE GRAM POSITIVE COCCI IN PAIRS IN CLUSTERS    Culture   Final    MODERATE METHICILLIN RESISTANT STAPHYLOCOCCUS AUREUS NO ANAEROBES ISOLATED Performed at [facility], [address], [place], [place] [address]    Report Status 05/27/2022 FINAL  Final   Organism ID, Bacteria METHICILLIN RESISTANT STAPHYLOCOCCUS AUREUS  Final      Susceptibility   Methicillin resistant staphylococcus aureus - MIC*    CIPROFLOXACIN <=0.5 SENSITIVE Sensitive     ERYTHROMYCIN >=8 RESISTANT Resistant     GENTAMICIN <=0.5 SENSITIVE Sensitive     OXACILLIN >=4 RESISTANT Resistant     TETRACYCLINE <=1 SENSITIVE Sensitive     VANCOMYCIN <=0.5 SENSITIVE Sensitive     TRIMETH/SULFA <=10 SENSITIVE Sensitive     CLINDAMYCIN <=0.25 SENSITIVE Sensitive      RIFAMPIN <=0.5 SENSITIVE Sensitive     Inducible Clindamycin NEGATIVE Sensitive     * MODERATE METHICILLIN RESISTANT STAPHYLOCOCCUS AUREUS      Radiology Studies last 3 days: No results found.           LOS: 5 days       Emeterio Reeve, DO [facility] 05/27/2022, 2:10 PM    Dictation software may have been used to generate the above note. Typos may occur and escape review in typed/dictated notes. Please contact Dr Sheppard Coil directly for clarity if needed.  Staff may message me via secure chat in [place]  but this may not receive an immediate response,  please page me for urgent matters!  If 7PM-7AM, please contact night coverage [email or web address]

## 2022-05-27 NOTE — Anesthesia Postprocedure Evaluation (Signed)
Anesthesia Post Note  Patient: [name] [name]  Procedure(s) Performed: REVISION TRANSMETATARSAL AMPUTATION WITH DELAYED PRIMARY CLOSURE (Right: Foot)  Patient location during evaluation: PACU Anesthesia Type: General Level of consciousness: awake and alert Pain management: pain level controlled Vital Signs Assessment: post-procedure vital signs reviewed and stable Respiratory status: spontaneous breathing, nonlabored ventilation, respiratory function stable and patient connected to nasal cannula oxygen Cardiovascular status: blood pressure returned to baseline and stable Postop Assessment: no apparent nausea or vomiting Anesthetic complications: no  No notable events documented.   Last Vitals:  Vitals:   05/27/22 1720 05/27/22 1731  BP: 130/75 130/76  Pulse: 75 75  Resp: 14 16  Temp:  36.6 C  SpO2: 99% 100%    Last Pain:  Vitals:   05/27/22 1747  TempSrc:   PainSc: [place]

## 2022-05-27 NOTE — Interval H&P Note (Signed)
History and Physical Interval Note:  05/27/2022 3:18 PM  [name] [name]  has presented today for surgery, with the diagnosis of Right Foot Amputation Revision Transmetatarsal with Delayed Primary Closure.  The various methods of treatment have been discussed with the patient and family. After consideration of risks, benefits and other options for treatment, the patient has consented to  Procedure(s): REVISION TRANSMETATARSAL AMPUTATION WITH DELAYED PRIMARY CLOSURE (Right) as a surgical intervention.  The patient's history has been reviewed, patient examined, no change in status, stable for surgery.  I have reviewed the patient's chart and labs.  Questions were answered to the patient's satisfaction.     [name]

## 2022-05-28 ENCOUNTER — Encounter: Payer: Self-pay | Admitting: Podiatry

## 2022-05-28 DIAGNOSIS — L089 Local infection of the skin and subcutaneous tissue, unspecified: Secondary | ICD-10-CM

## 2022-05-28 LAB — GLUCOSE, CAPILLARY
Glucose-Capillary: 104 mg/dL — ABNORMAL HIGH (ref 70–99)
Glucose-Capillary: 140 mg/dL — ABNORMAL HIGH (ref 70–99)
Glucose-Capillary: 109 mg/dL — ABNORMAL HIGH (ref 70–99)
Glucose-Capillary: 105 mg/dL — ABNORMAL HIGH (ref 70–99)

## 2022-05-28 LAB — VANCOMYCIN, RANDOM: Vancomycin Rm: 8 ug/mL

## 2022-05-28 LAB — BASIC METABOLIC PANEL
Anion gap: 5 (ref 5–15)
BUN: 13 mg/dL (ref 6–20)
CO2: 24 mmol/L (ref 22–32)
Calcium: 8.5 mg/dL — ABNORMAL LOW (ref 8.9–10.3)
Chloride: 105 mmol/L (ref 98–111)
Creatinine, Ser: 0.69 mg/dL (ref 0.44–1.00)
GFR, Estimated: >60 mL/min (ref >60)
Glucose, Bld: 113 mg/dL — ABNORMAL HIGH (ref 70–99)
Potassium: 4.2 mmol/L (ref 3.5–5.1)
Sodium: 134 mmol/L — ABNORMAL LOW (ref 135–145)

## 2022-05-28 LAB — C-REACTIVE PROTEIN: CRP: 6.9 mg/dL — ABNORMAL HIGH (ref <1.0)

## 2022-05-28 LAB — CBC
HCT: 31.4 % — ABNORMAL LOW (ref 36.0–46.0)
Hemoglobin: 10.1 g/dL — ABNORMAL LOW (ref 12.0–15.0)
MCH: 31 pg (ref 26.0–34.0)
MCHC: 32.2 g/dL (ref 30.0–36.0)
MCV: 96.3 fL (ref 80.0–100.0)
Platelets: 338 10*3/uL (ref 150–400)
RBC: 3.26 MIL/uL — ABNORMAL LOW (ref 3.87–5.11)
RDW: 14.4 % (ref 11.5–15.5)
WBC: 9 10*3/uL (ref 4.0–10.5)
nRBC: 0 % (ref 0.0–0.2)

## 2022-05-28 LAB — SEDIMENTATION RATE: Sed Rate: 86 mm/hr — ABNORMAL HIGH (ref 0–30)

## 2022-05-28 MED ORDER — POLYETHYLENE GLYCOL 3350 17 G PO PACK
17.0000 g | PACK | Freq: Two times a day (BID) | ORAL | Status: AC
  Administered 2022-05-28 – 2022-05-29 (×2): 17 g via ORAL
  Filled 2022-05-28 (×3): qty 1

## 2022-05-28 MED ORDER — VANCOMYCIN HCL IN DEXTROSE 1-5 GM/200ML-% IV SOLN
1000.0000 mg | Freq: Two times a day (BID) | INTRAVENOUS | Status: DC
  Administered 2022-05-28 – 2022-05-29 (×4): 1000 mg via INTRAVENOUS
  Filled 2022-05-28 (×5): qty 200

## 2022-05-28 MED ORDER — ENOXAPARIN SODIUM 60 MG/0.6ML IJ SOSY
0.5000 mg/kg | PREFILLED_SYRINGE | INTRAMUSCULAR | Status: DC
  Administered 2022-05-28 – 2022-05-30 (×3): 52.5 mg via SUBCUTANEOUS
  Filled 2022-05-28 (×3): qty 0.6

## 2022-05-28 MED ORDER — SENNOSIDES-DOCUSATE SODIUM 8.6-50 MG PO TABS
2.0000 | ORAL_TABLET | Freq: Two times a day (BID) | ORAL | Status: AC
  Administered 2022-05-28 – 2022-05-29 (×3): 2 via ORAL
  Filled 2022-05-28 (×3): qty 2

## 2022-05-28 NOTE — Progress Notes (Signed)
Date of Admission:  05/21/2022     ID: [name] [name] is a 56 y.o. female  Principal Problem:   Cellulitis of right foot Active Problems:   GERD (gastroesophageal reflux disease)   Essential hypertension   Severe sepsis ([name])   AMS (altered mental status)   Septic shock ([name])   Abscess of right foot   Morbid obesity ([place])   Soft tissue infection of foot  Pt with multiple fusion surgeries to the spine, rt reverse shoulder arthroplasty, rt TMA was admitted for altered mental status and infection at the TMA site- CT scan did no show any osteo underwent I/D on 05/22/22 MRSA in culture ( previously it had been MSSA)    Subjective: Had revision TMA yesterday Tired   Medications:   aspirin  81 mg Oral BID   busPIRone  15 mg Oral TID   Chlorhexidine Gluconate Cloth  6 each Topical Q0600   DULoxetine  20 mg Oral Daily   enoxaparin (LOVENOX) injection  0.5 mg/kg Subcutaneous Q24H   escitalopram  10 mg Oral Daily   estradiol  1 mg Oral QHS   insulin aspart  0-6 Units Subcutaneous TID WC   lamoTRIgine  100 mg Oral Daily   naloxegol oxalate  25 mg Oral Daily   pregabalin  100 mg Oral TID   sodium chloride flush  3 mL Intravenous Q12H   topiramate  50 mg Oral BID    Objective: Vital signs in last 24 hours: Patient Vitals for the past 24 hrs:  BP Temp Temp src Pulse Resp SpO2 Height Weight  05/28/22 0935 133/76 98 F (36.7 C) -- 75 18 97 % -- --  05/28/22 0410 123/79 98.5 F (36.9 C) -- 77 18 99 % -- --  05/27/22 2342 120/72 98 F (36.7 C) -- 75 18 100 % -- --  05/27/22 2025 122/73 98.1 F (36.7 C) -- 72 18 98 % -- --  05/27/22 1731 130/76 97.8 F (36.6 C) -- 75 16 100 % -- --  05/27/22 1720 130/75 -- -- 75 14 99 % -- --  05/27/22 1715 -- -- -- 77 14 99 % -- --  05/27/22 1710 128/74 -- -- 67 10 96 % -- --  05/27/22 1705 -- 97.9 F (36.6 C) -- 77 17 99 % -- --  05/27/22 1700 125/71 -- -- 69 10 95 % -- --  05/27/22 1655 -- -- -- 70 10 95 % -- --  05/27/22 1650 122/77  98 F (36.7 C) -- 69 11 100 % -- --  05/27/22 1645 -- -- -- 68 10 100 % -- --  05/27/22 1640 110/74 -- -- 70 11 100 % -- --  05/27/22 1635 112/75 99.5 F (37.5 C) -- 68 10 96 % -- --  05/27/22 1457 130/76 (!) 97.5 F (36.4 C) Temporal 77 18 95 % 5' 4"$  (1.626 m) 104.3 kg  05/27/22 1220 133/74 98 F (36.7 C) Oral 77 17 95 % -- --       PHYSICAL EXAM:  General: sleeping, woke up and fully oriented Lungs: Clear to auscultation bilaterally. No Wheezing or Rhonchi. No rales. Heart: Regular rate and rhythm, no murmur, rub or gallop. Abdomen: Soft, non-tender,not distended. Bowel sounds normal. No masses Extremities: rt foot, surgical dressing not removed Skin: No rashes or lesions. Or bruising Lymph: Cervical, supraclavicular normal. Neurologic: Grossly non-focal  Lab Results Recent Labs    05/27/22 0144 05/28/22 0502  WBC 12.1* 9.0  HGB 10.6* 10.1*  HCT 32.1* 31.4*  NA 136 134*  K 3.4* 4.2  CL 106 105  CO2 23 24  BUN 9 13  CREATININE 0.51 0.69    Microbiology: MRSA Studies/Results: Xray rt foot- no osteo   Assessment/Plan: Rt TMA site infection- underwent I/D and has MRSA infection- on vanco Went back for revision TMA on 05/27/22 culture and pathology sent Conti ue vanco Will decide on IV, and duration depending on pathology   H/o multiple surgeries to spine  Depression/anxiety on meds  Discussed the management with the patient and care team ID will follow her peripherally this weekend- call if needed

## 2022-05-28 NOTE — Progress Notes (Signed)
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  02/02: to ED as above. Septic shock, required brief levo gtt. 02/03: admitted to hospitalist service, underwent I&D w/ podiatry 02/04: stable on IV abx awaiting Cx 02/05: back to OR for revision I&D.   02/06-02/07: stable, planning back to OR 02/08. Cultures from 02/03 (+)MRSA 02/08: to OR for revision of RLE TMA. Per ID, continue vanco and send bone for pathology and culture  02/09: podiatry and ID recs for continue inpatient for now to ensure surgical stabilization and await culture results to guide abx, will likely be here thru the weekend (today is Friday)     Consultants:  Podiatry  Infectious Disease   Procedures: 05/22/22: I&D RLE TMA stump 05/24/22: I&D w/ revision/washout w/ primary delayed closure RLE TMA stump [PLANNED 05/27/22 RLE repeat washout, revisional transmetatarsal amputation and resection of necrotic bone and soft tissue and primary closure of the amputation site per podiatry]      ASSESSMENT & PLAN:   Principal Problem:   Cellulitis of right foot Active Problems:   Severe sepsis ([name])   GERD (gastroesophageal reflux disease)   Essential hypertension   AMS (altered mental status)   Septic shock ([name])   Abscess of right foot  Septic shock - POA - resolved  Right foot cellulitis and abscess at site of prior TMA with history of prior ulceration (+)MRSA NWB RLE  plan return to the OR on 05/27/2022 for repeat washout, revisional amputation and resection of necrotic  bone and soft tissue and primary closure of the amputation site.  cont IV vanc  ID following  Bone cultures pending    Depression anxiety bipolar disorder history of seizure disorder cont Buspar, Cymbalta and Lexapro  cont lamotrigine and Topamax   Chronic pain syndrome recent right shoulder arthroplasty cont home Percocet and Lyrica Per PT discussion with Dr Griffin Basil, FWBing through R UE on the walker   Morbid obesity, BMI 40 Complicates treatment and prognosis  Sleep apnea CPAP nightly     DVT prophylaxis: lovenox   Pertinent IV fluids/nutrition: no continuous IV fluids  Central lines / invasive devices: none  Code Status: FULL CODE  Current Admission Status: inpatient  TOC needs / Dispo plan: none at this time, may need SNF on eventual discharge but pt wishes to go home Barriers to discharge / significant pending items: surgery yesterday, PT/OT to follow, expect will need at least [facility]. Podiatry and ID clearance prior to d/c - advise stay thru the weekend to ensure stbilization and await cultures              Subjective / Brief ROS:  Patient reports no concerns at this time Denies CP/SOB. Pain controlled though foot is sore Denies new weakness.  Family Communication: none at this time     Objective Findings:  Vitals:   05/27/22 2025 05/27/22 2342 05/28/22 0410 05/28/22 0935  BP: 122/73 120/72 123/79 133/76  Pulse: 72 75 77 75  Resp: 18 18 18 18  $ Temp: 98.1 F (36.7 C) 98 F (36.7 C) 98.5 F (  36.9 C) 98 F (36.7 C)  TempSrc:      SpO2: 98% 100% 99% 97%  Weight:      Height:        Intake/Output Summary (Last 24 hours) at 05/28/2022 1336 Last data filed at 05/27/2022 1710 Gross per 24 hour  Intake 280 ml  Output 1 ml  Net 279 ml   Filed Weights   05/22/22 1221 05/24/22 1637 05/27/22 1457  Weight: 108.1 kg 108 kg 104.3 kg    Examination:  Physical Exam Constitutional:      Appearance: She is obese.  Cardiovascular:     Rate and  Rhythm: Normal rate and regular rhythm.     Heart sounds: Normal heart sounds.  Pulmonary:     Effort: Pulmonary effort is normal.     Breath sounds: Normal breath sounds.  Musculoskeletal:     Right lower leg: No edema (R foot bandage - clean, was not removed).     Left lower leg: No edema.  Neurological:     Mental Status: She is alert.          Scheduled Medications:   aspirin  81 mg Oral BID   busPIRone  15 mg Oral TID   Chlorhexidine Gluconate Cloth  6 each Topical Q0600   DULoxetine  20 mg Oral Daily   enoxaparin (LOVENOX) injection  0.5 mg/kg Subcutaneous Q24H   escitalopram  10 mg Oral Daily   estradiol  1 mg Oral QHS   insulin aspart  0-6 Units Subcutaneous TID WC   lamoTRIgine  100 mg Oral Daily   naloxegol oxalate  25 mg Oral Daily   pregabalin  100 mg Oral TID   sodium chloride flush  3 mL Intravenous Q12H   topiramate  50 mg Oral BID    Continuous Infusions:  sodium chloride Stopped (05/24/22 1920)   vancomycin 1,000 mg (05/28/22 1032)    PRN Medications:  acetaminophen **OR** acetaminophen, albuterol, ondansetron (ZOFRAN) IV, mouth rinse, oxyCODONE-acetaminophen  Antimicrobials from admission:  Anti-infectives (From admission, onward)    Start     Dose/Rate Route Frequency Ordered Stop   05/28/22 1000  vancomycin (VANCOCIN) IVPB 1000 mg/200 mL premix        1,000 mg 200 mL/hr over 60 Minutes Intravenous Every 12 hours 05/28/22 0820     05/25/22 1000  vancomycin (VANCOREADY) IVPB 1500 mg/300 mL  Status:  Discontinued        1,500 mg 150 mL/hr over 120 Minutes Intravenous Every 24 hours 05/25/22 0903 05/28/22 0820   05/24/22 1810  vancomycin (VANCOCIN) powder  Status:  Discontinued          As needed 05/24/22 1810 05/24/22 1824   05/24/22 1230  Ampicillin-Sulbactam (UNASYN) 3 g in sodium chloride 0.9 % 100 mL IVPB  Status:  Discontinued        3 g 200 mL/hr over 30 Minutes Intravenous Every 6 hours 05/24/22 1142 05/26/22 1040   05/23/22 0000   cefTRIAXone (ROCEPHIN) 2 g in sodium chloride 0.9 % 100 mL IVPB  Status:  Discontinued        2 g 200 mL/hr over 30 Minutes Intravenous Every 24 hours 05/22/22 0127 05/22/22 1139   05/22/22 1400  clindamycin (CLEOCIN) IVPB 600 mg  Status:  Discontinued        600 mg 100 mL/hr over 30 Minutes Intravenous Every 8 hours 05/22/22 1142 05/23/22 1311   05/22/22 1300  meropenem (MERREM) 1 g in sodium chloride 0.9 % 100 mL  IVPB  Status:  Discontinued        1 g 200 mL/hr over 30 Minutes Intravenous Every 8 hours 05/22/22 1219 05/24/22 1142   05/22/22 0300  vancomycin (VANCOREADY) IVPB 1500 mg/300 mL  Status:  Discontinued        1,500 mg 150 mL/hr over 120 Minutes Intravenous Every 24 hours 05/22/22 0209 05/24/22 1235   05/22/22 0000  cefTRIAXone (ROCEPHIN) 2 g in sodium chloride 0.9 % 100 mL IVPB        2 g 200 mL/hr over 30 Minutes Intravenous STAT 05/21/22 2351 05/22/22 0110   05/21/22 2345  vancomycin (VANCOCIN) IVPB 1000 mg/200 mL premix        1,000 mg 200 mL/hr over 60 Minutes Intravenous  Once 05/21/22 2340 05/22/22 0110   05/21/22 2345  aztreonam (AZACTAM) 2 g in sodium chloride 0.9 % 100 mL IVPB  Status:  Discontinued        2 g 200 mL/hr over 30 Minutes Intravenous  Once 05/21/22 2340 05/21/22 2351           Data Reviewed:  I have personally reviewed the following...  CBC: Recent Labs  Lab 05/21/22 2339 05/22/22 0203 05/24/22 0231 05/25/22 0256 05/26/22 0147 05/27/22 0144 05/28/22 0502  WBC 19.0*   < > 17.4* 11.3* 10.5 12.1* 9.0  NEUTROABS 17.0*  --   --   --   --   --   --   HGB 12.0   < > 9.6* 10.0* 10.8* 10.6* 10.1*  HCT 37.0   < > 28.7* 30.6* 33.2* 32.1* 31.4*  MCV 97.4   < > 95.3 95.9 96.5 95.8 96.3  PLT 239   < > 263 254 296 309 338   < > = values in this interval not displayed.   Basic Metabolic Panel: Recent Labs  Lab 05/23/22 0929 05/24/22 0231 05/25/22 0256 05/26/22 0147 05/27/22 0144 05/28/22 0502  NA 134* 136 134* 140 136 134*  K 3.8 3.7 3.7  3.9 3.4* 4.2  CL 108 107 103 105 106 105  CO2 19* 20* 26 22 23 24  $ GLUCOSE 148* 109* 88 96 107* 113*  BUN 13 16 12 10 9 13  $ CREATININE 0.56 0.62 0.69 0.55 0.51 0.69  CALCIUM 8.6* 8.5* 8.3* 8.5* 8.6* 8.5*  MG 2.1 2.2 2.0 2.2 2.3  --    GFR: Estimated Creatinine Clearance: 90.2 mL/min (by C-G formula based on SCr of 0.69 mg/dL). Liver Function Tests: Recent Labs  Lab 05/21/22 2339 05/22/22 0203  AST 17 18  ALT 12 10  ALKPHOS 85 71  BILITOT 0.8 0.7  PROT 7.5 5.8*  ALBUMIN 3.4* 2.7*   Recent Labs  Lab 05/21/22 2339  LIPASE 25   No results for input(s): "AMMONIA" in the last 168 hours. Coagulation Profile: Recent Labs  Lab 05/21/22 2339  INR 1.2   Cardiac Enzymes: No results for input(s): "CKTOTAL", "CKMB", "CKMBINDEX", "TROPONINI" in the last 168 hours. BNP (last 3 results) No results for input(s): "PROBNP" in the last 8760 hours. HbA1C: No results for input(s): "HGBA1C" in the last 72 hours. CBG: Recent Labs  Lab 05/27/22 1453 05/27/22 1637 05/27/22 2042 05/28/22 0857 05/28/22 1145  GLUCAP 93 97 138* 104* 140*   Lipid Profile: No results for input(s): "CHOL", "HDL", "LDLCALC", "TRIG", "CHOLHDL", "LDLDIRECT" in the last 72 hours. Thyroid Function Tests: No results for input(s): "TSH", "T4TOTAL", "FREET4", "T3FREE", "THYROIDAB" in the last 72 hours. Anemia Panel: No results for input(s): "VITAMINB12", "FOLATE", "FERRITIN", "TIBC", "IRON", "  RETICCTPCT" in the last 72 hours. Most Recent Urinalysis On File:     Component Value Date/Time   COLORURINE YELLOW (A) 05/22/2022 0530   APPEARANCEUR CLEAR (A) 05/22/2022 0530   LABSPEC 1.005 05/22/2022 0530   PHURINE 6.0 05/22/2022 0530   GLUCOSEU NEGATIVE 05/22/2022 0530   HGBUR NEGATIVE 05/22/2022 0530   BILIRUBINUR NEGATIVE 05/22/2022 0530   KETONESUR NEGATIVE 05/22/2022 0530   PROTEINUR NEGATIVE 05/22/2022 0530   UROBILINOGEN 0.2 06/23/2017 1108   NITRITE NEGATIVE 05/22/2022 0530   LEUKOCYTESUR NEGATIVE  05/22/2022 0530   Sepsis Labs: @LABRCNTIP$ (procalcitonin:4,lacticidven:4) Microbiology: Recent Results (from the past 240 hour(s))  Resp panel by RT-PCR (RSV, Flu A&B, Covid) Anterior Nasal Swab     Status: None   Collection Time: 05/21/22 11:39 PM   Specimen: Anterior Nasal Swab  Result Value Ref Range Status   SARS Coronavirus 2 by RT PCR NEGATIVE NEGATIVE Final    Comment: (NOTE) SARS-CoV-2 target nucleic acids are NOT DETECTED.  The SARS-CoV-2 RNA is generally detectable in upper respiratory specimens during the acute phase of infection. The lowest concentration of SARS-CoV-2 viral copies this assay can detect is 138 copies/mL. A negative result does not preclude SARS-Cov-2 infection and should not be used as the sole basis for treatment or other patient management decisions. A negative result may occur with  improper specimen collection/handling, submission of specimen other than nasopharyngeal swab, presence of viral mutation(s) within the areas targeted by this assay, and inadequate number of viral copies(<138 copies/mL). A negative result must be combined with clinical observations, patient history, and epidemiological information. The expected result is Negative.  Fact Sheet for Patients:  EntrepreneurPulse.com.au  Fact Sheet for Healthcare Providers:  IncredibleEmployment.be  This test is no t yet approved or cleared by the [place] FDA and  has been authorized for detection and/or diagnosis of SARS-CoV-2 by FDA under an Emergency Use Authorization (EUA). This EUA will remain  in effect (meaning this test can be used) for the duration of the COVID-19 declaration under Section 564(b)(1) of the Act, 21 U.S.C.section 360bbb-3(b)(1), unless the authorization is terminated  or revoked sooner.       Influenza A by PCR NEGATIVE NEGATIVE Final   Influenza B by PCR NEGATIVE NEGATIVE Final    Comment: (NOTE) The Xpert Xpress  SARS-CoV-2/FLU/RSV plus assay is intended as an aid in the diagnosis of influenza from Nasopharyngeal swab specimens and should not be used as a sole basis for treatment. Nasal washings and aspirates are unacceptable for Xpert Xpress SARS-CoV-2/FLU/RSV testing.  Fact Sheet for Patients: EntrepreneurPulse.com.au  Fact Sheet for Healthcare Providers: IncredibleEmployment.be  This test is not yet approved or cleared by the [place] FDA and has been authorized for detection and/or diagnosis of SARS-CoV-2 by FDA under an Emergency Use Authorization (EUA). This EUA will remain in effect (meaning this test can be used) for the duration of the COVID-19 declaration under Section 564(b)(1) of the Act, 21 U.S.C. section 360bbb-3(b)(1), unless the authorization is terminated or revoked.     Resp Syncytial Virus by PCR NEGATIVE NEGATIVE Final    Comment: (NOTE) Fact Sheet for Patients: EntrepreneurPulse.com.au  Fact Sheet for Healthcare Providers: IncredibleEmployment.be  This test is not yet approved or cleared by the [place] FDA and has been authorized for detection and/or diagnosis of SARS-CoV-2 by FDA under an Emergency Use Authorization (EUA). This EUA will remain in effect (meaning this test can be used) for the duration of the COVID-19 declaration under Section 564(b)(1) of the Act, 21  U.S.C. section 360bbb-3(b)(1), unless the authorization is terminated or revoked.  Performed at [facility], [address] [place], [place] [address]   Blood Culture (routine x 2)     Status: None   Collection Time: 05/21/22 11:39 PM   Specimen: BLOOD  Result Value Ref Range Status   Specimen Description BLOOD LEFT ANTECUBITAL  Final   Special Requests   Final    BOTTLES DRAWN AEROBIC AND ANAEROBIC Blood Culture adequate volume   Culture   Final    NO GROWTH 5 DAYS Performed at [facility],  [address] [place], [place] [address]    Report Status 05/26/2022 FINAL  Final  Blood Culture (routine x 2)     Status: None   Collection Time: 05/21/22 11:44 PM   Specimen: BLOOD  Result Value Ref Range Status   Specimen Description BLOOD RIGHT ANTECUBITAL  Final   Special Requests   Final    BOTTLES DRAWN AEROBIC AND ANAEROBIC Blood Culture results may not be optimal due to an excessive volume of blood received in culture bottles   Culture   Final    NO GROWTH 5 DAYS Performed at [facility], [address] [place], [place] [address]    Report Status 05/27/2022 FINAL  Final  MRSA Next Gen by PCR, Nasal     Status: None   Collection Time: 05/22/22 12:26 PM   Specimen: Nasal Mucosa; Nasal Swab  Result Value Ref Range Status   MRSA by PCR Next Gen NOT DETECTED NOT DETECTED Final    Comment: (NOTE) The GeneXpert MRSA Assay (FDA approved for NASAL specimens only), is one component of a comprehensive MRSA colonization surveillance program. It is not intended to diagnose MRSA infection nor to guide or monitor treatment for MRSA infections. Test performance is not FDA approved in patients less than 5 years old. Performed at [facility], [address] [place], [place] [address]   Aerobic/Anaerobic Culture w Gram Stain (surgical/deep wound)     Status: None   Collection Time: 05/22/22  4:38 PM   Specimen: Foot, Right; Abscess  Result Value Ref Range Status   Specimen Description   Final    FOOT Performed at [facility], [address], [place], [place] [address]    Special Requests   Final    RT FOOT ABSCESS Performed at [facility], [address] [place], [place] [address]    Gram Stain   Final    RARE WBC PRESENT,BOTH PMN AND MONONUCLEAR RARE GRAM POSITIVE COCCI IN PAIRS IN CLUSTERS    Culture   Final    MODERATE METHICILLIN RESISTANT STAPHYLOCOCCUS AUREUS NO ANAEROBES ISOLATED Performed at [facility], [address], [place], [place] [address]    Report Status 05/27/2022 FINAL  Final   Organism ID, Bacteria METHICILLIN RESISTANT STAPHYLOCOCCUS AUREUS  Final      Susceptibility   Methicillin resistant staphylococcus aureus - MIC*    CIPROFLOXACIN <=0.5 SENSITIVE Sensitive     ERYTHROMYCIN >=8 RESISTANT Resistant     GENTAMICIN <=0.5 SENSITIVE Sensitive     OXACILLIN >=4 RESISTANT Resistant     TETRACYCLINE <=1 SENSITIVE Sensitive     VANCOMYCIN <=0.5 SENSITIVE Sensitive     TRIMETH/SULFA <=10 SENSITIVE Sensitive     CLINDAMYCIN <=0.25 SENSITIVE Sensitive     RIFAMPIN <=0.5 SENSITIVE Sensitive     Inducible Clindamycin NEGATIVE Sensitive     * MODERATE METHICILLIN RESISTANT STAPHYLOCOCCUS AUREUS  Aerobic/Anaerobic Culture  w Gram Stain (surgical/deep wound)     Status: None (Preliminary result)   Collection Time: 05/27/22  3:59 PM   Specimen: PATH Other; Tissue  Result Value Ref Range Status   Specimen Description   Final    WOUND RIGHT FOOT AMPUTATION Performed at [facility], [address] [place], [place] [address]    Special Requests   Final    NONE Performed at [facility], [place], [place] [address]    Gram Stain NO WBC SEEN NO ORGANISMS SEEN   Final   Culture   Final    NO GROWTH < 12 HOURS Performed at [facility], [place] [address], [place],  [address]    Report Status PENDING  Incomplete      Radiology Studies last 3 days: DG Foot Complete Right  Result Date: 05/27/2022 CLINICAL DATA:  Postoperative check. Revision transmetatarsal amputation with delayed primary closure. EXAM: RIGHT FOOT COMPLETE - 3+ VIEW COMPARISON:  Right foot radiographs 05/22/2022 FINDINGS: There is a revision amputation of the first metatarsal to the proximal metaphysis. There appears to be no significant change in prior amputation to the proximal shafts of the second through fifth metatarsals. There are distal foot surgical skin staples. Expected  postoperative subcutaneous air is seen. There is again mild high-grade chronic enthesopathic change at the plantar fascia origin. Mild chronic spurring at the Achilles tendon insertion. No cortical erosion is visualized. IMPRESSION: Status post revision amputation of the first metatarsal to the proximal metaphysis. There is a sharp resection border. Electronically Signed   By: Yvonne Kendall M.D.   On: 05/27/2022 18:38   DG MINI C-ARM IMAGE ONLY  Result Date: 05/27/2022 There is no interpretation for this exam.  This order is for images obtained during a surgical procedure.  Please See "Surgeries" Tab for more information regarding the procedure.             LOS: 6 days       Emeterio Reeve, DO [facility] 05/28/2022, 1:36 PM    Dictation software may have been used to generate the above note. Typos may occur and escape review in typed/dictated notes. Please contact Dr Sheppard Coil directly for clarity if needed.  Staff may message me via secure chat in [place]  but this may not receive an immediate response,  please page me for urgent matters!  If 7PM-7AM, please contact night coverage [email or web address]

## 2022-05-28 NOTE — Progress Notes (Signed)
Physical Therapy Treatment Patient Details Name: [name] CAVICCHI MRN: [id number] DOB: [birth year] Today's Date: 05/28/2022   History of Present Illness 57 year old female with history of bipolar disorder, total arthroplasty right shoulder April 14, 2022, chronic pain syndrome, anxiety depression, recent right toe amputation who presented to emergency room with altered mental status and redness of the right foot amputation.    PT Comments    Pt is making good progress towards goals with ability to perform SPT from bed to chair. Able to use RW and maintain correct WBing on surgical leg. Will continue to progress as able.  Recommendations for follow up therapy are one component of a multi-disciplinary discharge planning process, led by the attending physician.  Recommendations may be updated based on patient status, additional functional criteria and insurance authorization.  Follow Up Recommendations  Follow physician's recommendations for discharge plan and follow up therapies Can patient physically be transported by private vehicle: Yes   Assistance Recommended at Discharge Intermittent Supervision/Assistance  Patient can return home with the following A lot of help with walking and/or transfers;A lot of help with bathing/dressing/bathroom;Assistance with cooking/housework;Assist for transportation;Help with stairs or ramp for entrance   Equipment Recommendations  None recommended by PT    Recommendations for Other Services       Precautions / Restrictions Precautions Precautions: Fall Restrictions Weight Bearing Restrictions: Yes RUE Weight Bearing: Weight bearing as tolerated RLE Weight Bearing: Non weight bearing     Mobility  Bed Mobility Overal bed mobility: Needs Assistance Bed Mobility: Supine to Sit     Supine to sit: Min guard     General bed mobility comments: improved with not as much effort as previous session.    Transfers Overall transfer level: Needs  assistance Equipment used: Rolling walker (2 wheels) Transfers: Sit to/from Stand Sit to Stand: Min assist Stand pivot transfers: Min assist         General transfer comment: able to rise from low surface. Uses R UE on RW. Able to SPT to L side from bed->chair. Does well at pivoting, choosing to not hop    Ambulation/Gait               General Gait Details: unable to ambulate at this time   Stairs             Wheelchair Mobility    Modified Rankin (Stroke Patients Only)       Balance Overall balance assessment: Needs assistance Sitting-balance support: No upper extremity supported Sitting balance-Leahy Scale: Good     Standing balance support: Bilateral upper extremity supported Standing balance-Leahy Scale: Fair                              Cognition Arousal/Alertness: Awake/alert Behavior During Therapy: WFL for tasks assessed/performed Overall Cognitive Status: Within Functional Limits for tasks assessed                                          Exercises      General Comments        Pertinent Vitals/Pain Pain Assessment Pain Assessment: Faces Faces Pain Scale: Hurts a little bit Pain Location: L back, R shoulder Pain Descriptors / Indicators: Aching Pain Intervention(s): Limited activity within patient's tolerance, Repositioned    Home Living  Prior Function            PT Goals (current goals can now be found in the care plan section) Acute Rehab PT Goals Patient Stated Goal: get back home PT Goal Formulation: With patient Time For Goal Achievement: 06/07/22 Potential to Achieve Goals: Fair Progress towards PT goals: Progressing toward goals    Frequency    7X/week      PT Plan Current plan remains appropriate    Co-evaluation              AM-PAC PT "6 Clicks" Mobility   Outcome Measure  Help needed turning from your back to your side while in a  flat bed without using bedrails?: None Help needed moving from lying on your back to sitting on the side of a flat bed without using bedrails?: None Help needed moving to and from a bed to a chair (including a wheelchair)?: A Little Help needed standing up from a chair using your arms (e.g., wheelchair or bedside chair)?: A Lot Help needed to walk in hospital room?: A Lot Help needed climbing 3-5 steps with a railing? : Total 6 Click Score: 16    End of Session Equipment Utilized During Treatment: Gait belt Activity Tolerance: Patient tolerated treatment well Patient left: with call bell/phone within reach;with chair alarm set Nurse Communication: Mobility status PT Visit Diagnosis: Muscle weakness (generalized) (M62.81);Difficulty in walking, not elsewhere classified (R26.2)     Time: [id number] PT Time Calculation (min) (ACUTE ONLY): 12 min  Charges:  $Therapeutic Activity: 8-22 mins                     Greggory Stallion, PT, DPT, GCS [phone]    [name] [name] 05/28/2022, 12:31 PM

## 2022-05-28 NOTE — Telephone Encounter (Signed)
Requested Prescriptions  Pending Prescriptions Disp Refills   spironolactone (ALDACTONE) 50 MG tablet [Pharmacy Med Name: spironolactone 50 mg tablet] 90 tablet 1    Sig: TAKE ONE TABLET BY MOUTH ONCE DAILY     Cardiovascular: Diuretics - Aldosterone Antagonist Failed - 05/27/2022  5:08 PM      Failed - K in normal range and within 180 days    Potassium  Date Value Ref Range Status  05/28/2022 4.2 3.5 - 5.1 mmol/L Final         Failed - Valid encounter within last 6 months    Recent Outpatient Visits           8 months ago Benign essential HTN   [place] [name], MD   10 months ago Infectious diarrhea   [place] Dennard Schaumann, Cammie Mcgee, MD   1 year ago Blood in stool, frank   [place] Pickard, Cammie Mcgee, MD   1 year ago Ulcer of right foot with fat layer exposed ([place])   [place] [name], MD   1 year ago Upper respiratory tract infection, unspecified type   [place] Eulogio Bear, NP              Passed - Cr in normal range and within 180 days    Creat  Date Value Ref Range Status  02/19/2022 0.81 0.50 - 1.03 mg/dL Final   Creatinine, Ser  Date Value Ref Range Status  05/28/2022 0.69 0.44 - 1.00 mg/dL Final         Passed - Na in normal range and within 180 days    Sodium  Date Value Ref Range Status  05/28/2022 134 (L) 135 - 145 mmol/L Final         Passed - eGFR is 30 or above and within 180 days    GFR, Est African American  Date Value Ref Range Status  06/27/2020 119 > OR = 60 mL/min/1.24m Final   GFR, Est Non African American  Date Value Ref Range Status  06/27/2020 102 > OR = 60 mL/min/1.723mFinal   GFR, Estimated  Date Value Ref Range Status  05/28/2022 >60 >60 mL/min Final    Comment:    (NOTE) Calculated using the CKD-EPI Creatinine Equation (2021)    eGFR  Date Value Ref Range Status  02/01/2022 103 > OR = 60 mL/min/1.7374mFinal         Passed - Last BP in normal range    BP Readings from Last 1 Encounters:  05/28/22 123/79

## 2022-05-28 NOTE — Progress Notes (Addendum)
   PODIATRY PROGRESS NOTE  NAME [name] [name] MRN [id number] DOB 1965-08-03 DOA 05/21/2022   Reason for consult:  Chief Complaint  Patient presents with   Altered Mental Status   Foot Pain     History of present illness: 57 y.o. female POD # 1 s/p TMA.  Planning for hip, and back pain and foot pain.  No fevers or chills.  Vitals:   05/28/22 0935 05/28/22 1534  BP: 133/76 (!) 126/98  Pulse: 75 (!) 104  Resp: 18 16  Temp: 98 F (36.7 C)   SpO2: 97% 100%       Latest Ref Rng & Units 05/28/2022    5:02 AM 05/27/2022    1:44 AM 05/26/2022    1:47 AM  CBC  WBC 4.0 - 10.5 K/uL 9.0  12.1  10.5   Hemoglobin 12.0 - 15.0 g/dL 10.1  10.6  10.8   Hematocrit 36.0 - 46.0 % 31.4  32.1  33.2   Platelets 150 - 400 K/uL 338  309  296        Latest Ref Rng & Units 05/28/2022    5:02 AM 05/27/2022    1:44 AM 05/26/2022    1:47 AM  BMP  Glucose 70 - 99 mg/dL 113  107  96   BUN 6 - 20 mg/dL '13  9  10   '$ Creatinine 0.44 - 1.00 mg/dL 0.69  0.51  0.55   Sodium 135 - 145 mmol/L 134  136  140   Potassium 3.5 - 5.1 mmol/L 4.2  3.4  3.9   Chloride 98 - 111 mmol/L 105  106  105   CO2 22 - 32 mmol/L '24  23  22   '$ Calcium 8.9 - 10.3 mg/dL 8.5  8.6  8.5       Physical Exam: General: AAO, NAD  Dressing clean, dry, intact    ASSESSMENT/PLAN OF CARE  Postop day 1 status post TMA revision  -Culture 2/8 no growth to date -Path 2/8- pending  -Inflammatory markers elevated this morning however this could be from postsurgical. -Will plan to change the dressing tomorrow.  Continue nonweightbearing, elevation. -Would recommend staying inpatient till we receive culture, pathology data. -We will continue to follow.    Please contact me directly with any questions or concerns.     Celesta Gentile, DPM [facility]  Dr. Bonna Gains. [address], [address] [place], [place] [address]                Office [phone]  Fax [phone]

## 2022-05-28 NOTE — Consult Note (Signed)
Pharmacy Antibiotic Note  [name] [name] is a 57 y.o. female admitted on 05/21/2022 with AMS and right foot stump pain and redness. 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 Taking PO cephalexin PTA and recent Rx for PO bactrim 05/20/22 (unsure if started) Pharmacy has been consulted for vancomycin dosing.   - vancomycin stopped 2/5 as previous culture with MSSA, current culture with MRSA so resume vancomycin. Last dose was 2/5 at 0400  Today, 05/28/2022 Day #7 antibiotics Renal: SCr stable and WNL WBC improved to WNL Afebrile OR 2/3 for I&D and 2/5 repeat I&D (wound remains open) Does not appear to be bone involvement per Podiatry 2/3 OR culture: MRSA - susc to tetracycline (allergy listed) and TMP/SMZ.  Final culture pending Returned to OR 2/8 revision of TMA and wound closure Cultures and pathology sent and pending  Vancomycin levels: Vancomycin 1543m IV q24h (dose 2/8 at a10:04), peak = 27 mcg/ml at 12:29 2/8 and random vancomycin level = 7 mcg/ml at 05:02 2/9 AUC = 352.8 w/ Cmax = 30.2 mcg/ml and Cmin = 5.4 mcg/mL  Plan: Adjust vancomycin to 1gm IV q12h based on vancomycin levels with 2/8 dose (subtherapeutic) AUC goal = 400-600 AUC with new dose = 470 and Cmin = 12.3 mcg/mL Monitor renal function and need to recheck levels Follow wound cultures and pathology   Height: 5' 4"$  (162.6 cm) Weight: 104.3 kg (230 lb) IBW/kg (Calculated) : 54.7  Temp (24hrs), Avg:98.1 F (36.7 C), Min:97.5 F (36.4 C), Max:99.5 F (37.5 C)  Recent Labs  Lab 05/21/22 2339 05/22/22 0203 05/22/22 0520 05/22/22 1301 05/23/22 0929 05/24/22 0231 05/25/22 0256 05/26/22 0147 05/27/22 0144 05/27/22 1329 05/28/22 0502  WBC 19.0* 18.7*   < >  --    < > 17.4* 11.3* 10.5 12.1*  --  9.0  CREATININE 0.81 0.79  --   --    < > 0.62 0.69 0.55 0.51  --  0.69  LATICACIDVEN 2.2* 2.3*  --  1.2  --   --   --   --   --   --   --   VANCOPEAK  --   --   --   --   --    --   --   --   --  28  --   VANCORANDOM  --   --   --   --   --   --   --   --   --   --  8   < > = values in this interval not displayed.     Estimated Creatinine Clearance: 90.2 mL/min (by C-G formula based on SCr of 0.69 mg/dL).    Allergies  Allergen Reactions   Other    Tetracyclines & Related Other (See Comments)    Syncope and put her "in a coma"   Tramadol Other (See Comments)    Seizures   Phenazopyridine Other (See Comments)    Unknown   Tetracycline Other (See Comments)    Syncope and "Put me in a coma"   Ciprofloxacin Rash and Itching   Codeine Itching and Rash   Estradiol Rash    Patches broke out the skin    Antimicrobials this admission: 2/5 Unasyn >> 2/7 2/3 vancomycin >>  2/3 Meropenem >>2/5 2/3 ceftriaxone x 1 dose   Dose adjustments this admission:   Microbiology results: 2/2 BCx: NGTD 2/3: R foot abscess cx: mod MRSA 2/3 MRAS PCR neg 2/8 foot amputation:  Thank you for allowing pharmacy to be a part of this patient's care.  Doreene Eland, PharmD, BCPS, BCIDP Work Cell: [phone] 05/28/2022 8:13 AM

## 2022-05-28 NOTE — TOC Progression Note (Signed)
Transition of Care [facility]) - Progression Note    Patient Details  Name: [name] [name] MRN: [id number] Date of Birth: [birth year]  [facility]) CM/SW [place], LCSW Phone Number: 05/28/2022, 12:37 PM  Clinical Narrative:  [facility] is waiting on start of care and needs before accepting/declining home health referral.   Expected Discharge Plan: [place] Barriers to Discharge: Continued Medical Work up  Expected Discharge Plan and Services     Post Acute Care Choice: [place] arrangements for the past 2 months: Single Family Home                                       Social Determinants of Health (SDOH) Interventions SDOH Screenings   Food Insecurity: No Food Insecurity (05/22/2022)  Housing: Low Risk  (05/22/2022)  Transportation Needs: No Transportation Needs (05/22/2022)  Utilities: Not At Risk (05/22/2022)  Alcohol Screen: Low Risk  (08/21/2020)  Depression (PHQ2-9): Low Risk  (09/18/2021)  Financial Resource Strain: Low Risk  (08/21/2020)  Physical Activity: Inactive (08/21/2020)  Social Connections: Socially Isolated (08/21/2020)  Stress: No Stress Concern Present (08/21/2020)  Tobacco Use: Low Risk  (05/28/2022)    Readmission Risk Interventions     No data to display

## 2022-05-29 DIAGNOSIS — L089 Local infection of the skin and subcutaneous tissue, unspecified: Secondary | ICD-10-CM | POA: Diagnosis not present

## 2022-05-29 LAB — GLUCOSE, CAPILLARY
Glucose-Capillary: 98 mg/dL (ref 70–99)
Glucose-Capillary: 105 mg/dL — ABNORMAL HIGH (ref 70–99)
Glucose-Capillary: 92 mg/dL (ref 70–99)
Glucose-Capillary: 115 mg/dL — ABNORMAL HIGH (ref 70–99)

## 2022-05-29 NOTE — Progress Notes (Addendum)
   PODIATRY PROGRESS NOTE  NAME [name] [name] MRN [id number] DOB Oct 03, 1965 DOA 05/21/2022   Reason for consult:  Chief Complaint  Patient presents with   Altered Mental Status   Foot Pain     History of present illness: 57 y.o. female POD # 2 s/p TMA.  No acute complaints no other pains.  No fevers or chills.  Vitals:   05/28/22 1534 05/28/22 2324  BP: (!) 126/98 123/75  Pulse: (!) 104 74  Resp: 16 18  Temp:  98.8 F (37.1 C)  SpO2: 100% 97%       Latest Ref Rng & Units 05/28/2022    5:02 AM 05/27/2022    1:44 AM 05/26/2022    1:47 AM  CBC  WBC 4.0 - 10.5 K/uL 9.0  12.1  10.5   Hemoglobin 12.0 - 15.0 g/dL 10.1  10.6  10.8   Hematocrit 36.0 - 46.0 % 31.4  32.1  33.2   Platelets 150 - 400 K/uL 338  309  296        Latest Ref Rng & Units 05/28/2022    5:02 AM 05/27/2022    1:44 AM 05/26/2022    1:47 AM  BMP  Glucose 70 - 99 mg/dL 113  107  96   BUN 6 - 20 mg/dL '13  9  10   '$ Creatinine 0.44 - 1.00 mg/dL 0.69  0.51  0.55   Sodium 135 - 145 mmol/L 134  136  140   Potassium 3.5 - 5.1 mmol/L 4.2  3.4  3.9   Chloride 98 - 111 mmol/L 105  106  105   CO2 22 - 32 mmol/L '24  23  22   '$ Calcium 8.9 - 10.3 mg/dL 8.5  8.6  8.5       Physical Exam: General: AAO, NAD  Dressing clean, dry, intact    ASSESSMENT/PLAN OF CARE  Postop day 2 status post TMA revision  -Culture 2/8 no growth to date -Path 2/8- pending  -Will continue to trend WBC. -Dressing was changed today.  No signs of infection noted no dehiscence noted.  No redness noted cellulitis is improved -Would recommend staying inpatient till we receive culture, pathology data. -We will continue to follow. -No dressing change until follow-up

## 2022-05-29 NOTE — Progress Notes (Signed)
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  02/02: to ED as above. Septic shock, required brief levo gtt. 02/03: admitted to hospitalist service, underwent I&D w/ podiatry 02/04: stable on IV abx awaiting Cx 02/05: back to OR for revision I&D.   02/06-02/07: stable, planning back to OR 02/08. Cultures from 02/03 (+)MRSA 02/08: to OR for revision of RLE TMA. Per ID, continue vanco and send bone for pathology and culture  02/09: podiatry and ID recs for continue inpatient for now to ensure surgical stabilization and await culture results to guide abx, will likely be here thru the weekend (today is Friday)   02/10: stable, awaiting cultures, podiatry examined this AM see photos, bandages in place per their orders to change dressing at follow-up    Consultants:  Podiatry  Infectious Disease   Procedures: 05/22/22: I&D RLE TMA stump 05/24/22: I&D w/ revision/washout w/ primary delayed closure RLE TMA stump [PLANNED 05/27/22 RLE repeat washout, revisional transmetatarsal amputation and resection of necrotic bone and soft tissue and primary closure of the amputation site per podiatry]      ASSESSMENT & PLAN:   Principal Problem:   Cellulitis of right foot Active Problems:   Severe sepsis ([name])   GERD (gastroesophageal reflux disease)   Essential hypertension   AMS (altered mental status)   Septic shock ([name])   Abscess of right foot  Septic shock - POA - resolved  Right foot cellulitis and abscess at site of prior TMA with history  of prior ulceration (+)MRSA NWB RLE  S/p return to the OR on 05/27/2022 for repeat washout, revisional amputation and resection of necrotic bone and soft tissue and primary closure of the amputation site.  cont IV vanc pending cultures  Podiatry and ID following    Depression anxiety bipolar disorder history of seizure disorder cont Buspar, Cymbalta and Lexapro  cont lamotrigine and Topamax   Chronic pain syndrome recent right shoulder arthroplasty cont home Percocet and Lyrica Per PT discussion with Dr Griffin Basil, FWBing through R UE on the walker   Morbid obesity, BMI 40 Complicates treatment and prognosis  Sleep apnea CPAP nightly     DVT prophylaxis: lovenox   Pertinent IV fluids/nutrition: no continuous IV fluids  Central lines / invasive devices: none  Code Status: FULL CODE  Current Admission Status: inpatient  TOC needs / Dispo plan: none at this time Barriers to discharge / significant pending items: surgery POD2, PT/OT to follow. Podiatry and ID clearance prior to d/c - advise stay thru the weekend to ensure stbilization and await cultures              Subjective / Brief ROS:  Patient reports no concerns at this time Denies CP/SOB. Pain controlled though foot is sore Denies new weakness.  Family Communication: none at this time     Objective Findings:  Vitals:   05/28/22 0935 05/28/22 1534 05/28/22 2324 05/29/22 0815  BP: 133/76 (!) 126/98 123/75 132/79  Pulse: 75 (!) 104 74 72  Resp: 18 16 18 17  $ Temp: 98 F (36.7 C)  98.8 F (37.1 C) 98 F (36.7 C)  TempSrc:      SpO2: 97% 100% 97% 96%  Weight:      Height:        Intake/Output Summary (Last 24 hours) at 05/29/2022 1158 Last data filed at 05/29/2022 0400 Gross per 24 hour  Intake 640 ml  Output --  Net 640 ml   Filed Weights   05/22/22 1221 05/24/22 1637 05/27/22 1457  Weight: 108.1 kg 108 kg 104.3 kg    Examination:  Physical Exam Constitutional:      Appearance: She is  obese.  Cardiovascular:     Rate and Rhythm: Normal rate and regular rhythm.     Heart sounds: Normal heart sounds.  Pulmonary:     Effort: Pulmonary effort is normal.     Breath sounds: Normal breath sounds.  Musculoskeletal:     Right lower leg: No edema (R foot bandage - clean, was not removed).     Left lower leg: No edema.  Neurological:     Mental Status: She is alert.          Scheduled Medications:   aspirin  81 mg Oral BID   busPIRone  15 mg Oral TID   Chlorhexidine Gluconate Cloth  6 each Topical Q0600   DULoxetine  20 mg Oral Daily   enoxaparin (LOVENOX) injection  0.5 mg/kg Subcutaneous Q24H   escitalopram  10 mg Oral Daily   estradiol  1 mg Oral QHS   insulin aspart  0-6 Units Subcutaneous TID WC   lamoTRIgine  100 mg Oral Daily   naloxegol oxalate  25 mg Oral Daily   polyethylene glycol  17 g Oral BID   pregabalin  100 mg Oral TID   senna-docusate  2 tablet Oral BID   sodium chloride flush  3 mL Intravenous Q12H   topiramate  50 mg Oral BID    Continuous Infusions:  sodium chloride Stopped (05/24/22 1920)   vancomycin 1,000 mg (05/29/22 1022)    PRN Medications:  acetaminophen **OR** acetaminophen, albuterol, ondansetron (ZOFRAN) IV, mouth rinse, oxyCODONE-acetaminophen  Antimicrobials from admission:  Anti-infectives (From admission, onward)    Start     Dose/Rate Route Frequency Ordered Stop   05/28/22 1000  vancomycin (VANCOCIN) IVPB 1000 mg/200 mL premix        1,000 mg 200 mL/hr over 60 Minutes Intravenous Every 12 hours 05/28/22 0820     05/25/22 1000  vancomycin (VANCOREADY) IVPB 1500 mg/300 mL  Status:  Discontinued        1,500 mg 150 mL/hr over 120 Minutes Intravenous Every 24 hours 05/25/22 0903 05/28/22 0820   05/24/22 1810  vancomycin (VANCOCIN) powder  Status:  Discontinued          As needed 05/24/22 1810 05/24/22 1824   05/24/22 1230  Ampicillin-Sulbactam (UNASYN) 3 g in sodium chloride 0.9 % 100 mL IVPB  Status:  Discontinued         3 g 200 mL/hr over 30 Minutes Intravenous Every 6 hours 05/24/22 1142 05/26/22 1040   05/23/22 0000  cefTRIAXone (ROCEPHIN) 2 g in sodium chloride 0.9 % 100 mL IVPB  Status:  Discontinued        2 g 200 mL/hr over 30 Minutes Intravenous Every 24 hours 05/22/22 0127 05/22/22 1139   05/22/22 1400  clindamycin (CLEOCIN) IVPB 600 mg  Status:  Discontinued        600 mg 100 mL/hr over 30 Minutes Intravenous Every 8 hours 05/22/22 1142  05/23/22 1311   05/22/22 1300  meropenem (MERREM) 1 g in sodium chloride 0.9 % 100 mL IVPB  Status:  Discontinued        1 g 200 mL/hr over 30 Minutes Intravenous Every 8 hours 05/22/22 1219 05/24/22 1142   05/22/22 0300  vancomycin (VANCOREADY) IVPB 1500 mg/300 mL  Status:  Discontinued        1,500 mg 150 mL/hr over 120 Minutes Intravenous Every 24 hours 05/22/22 0209 05/24/22 1235   05/22/22 0000  cefTRIAXone (ROCEPHIN) 2 g in sodium chloride 0.9 % 100 mL IVPB        2 g 200 mL/hr over 30 Minutes Intravenous STAT 05/21/22 2351 05/22/22 0110   05/21/22 2345  vancomycin (VANCOCIN) IVPB 1000 mg/200 mL premix        1,000 mg 200 mL/hr over 60 Minutes Intravenous  Once 05/21/22 2340 05/22/22 0110   05/21/22 2345  aztreonam (AZACTAM) 2 g in sodium chloride 0.9 % 100 mL IVPB  Status:  Discontinued        2 g 200 mL/hr over 30 Minutes Intravenous  Once 05/21/22 2340 05/21/22 2351           Data Reviewed:  I have personally reviewed the following...  CBC: Recent Labs  Lab 05/24/22 0231 05/25/22 0256 05/26/22 0147 05/27/22 0144 05/28/22 0502  WBC 17.4* 11.3* 10.5 12.1* 9.0  HGB 9.6* 10.0* 10.8* 10.6* 10.1*  HCT 28.7* 30.6* 33.2* 32.1* 31.4*  MCV 95.3 95.9 96.5 95.8 96.3  PLT 263 254 296 309 [id number]   Basic Metabolic Panel: Recent Labs  Lab 05/23/22 0929 05/24/22 0231 05/25/22 0256 05/26/22 0147 05/27/22 0144 05/28/22 0502  NA 134* 136 134* 140 136 134*  K 3.8 3.7 3.7 3.9 3.4* 4.2  CL 108 107 103 105 106 105  CO2 19* 20* 26 22 23 24   $ GLUCOSE 148* 109* 88 96 107* 113*  BUN 13 16 12 10 9 13  $ CREATININE 0.56 0.62 0.69 0.55 0.51 0.69  CALCIUM 8.6* 8.5* 8.3* 8.5* 8.6* 8.5*  MG 2.1 2.2 2.0 2.2 2.3  --    GFR: Estimated Creatinine Clearance: 90.2 mL/min (by C-G formula based on SCr of 0.69 mg/dL). Liver Function Tests: No results for input(s): "AST", "ALT", "ALKPHOS", "BILITOT", "PROT", "ALBUMIN" in the last 168 hours.  No results for input(s): "LIPASE", "AMYLASE" in the last 168 hours.  No results for input(s): "AMMONIA" in the last 168 hours. Coagulation Profile: No results for input(s): "INR", "PROTIME" in the last 168 hours.  Cardiac Enzymes: No results for input(s): "CKTOTAL", "CKMB", "CKMBINDEX", "TROPONINI" in the last 168 hours. BNP (last 3 results) No results for input(s): "PROBNP" in the last 8760 hours. HbA1C: No results for input(s): "HGBA1C" in the last 72 hours. CBG: Recent Labs  Lab 05/28/22 1145 05/28/22 1749 05/28/22 2135 05/29/22 0816 05/29/22 1143  GLUCAP 140* 109* 105* 98 105*   Lipid Profile: No results for input(s): "CHOL", "HDL", "LDLCALC", "TRIG", "CHOLHDL", "LDLDIRECT" in the last 72 hours. Thyroid Function Tests: No results for input(s): "TSH", "T4TOTAL", "FREET4", "T3FREE", "THYROIDAB" in the last 72 hours. Anemia Panel: No results for input(s): "VITAMINB12", "FOLATE", "FERRITIN", "TIBC", "IRON", "RETICCTPCT" in the last 72 hours. Most Recent Urinalysis On File:     Component Value Date/Time   COLORURINE YELLOW (A) 05/22/2022 0530   APPEARANCEUR CLEAR (A) 05/22/2022 0530   LABSPEC 1.005 05/22/2022 0530   PHURINE 6.0 05/22/2022 0530   GLUCOSEU NEGATIVE 05/22/2022 0530   HGBUR NEGATIVE 05/22/2022 0530   BILIRUBINUR  NEGATIVE 05/22/2022 0530   KETONESUR NEGATIVE 05/22/2022 0530   PROTEINUR NEGATIVE 05/22/2022 0530   UROBILINOGEN 0.2 06/23/2017 1108   NITRITE NEGATIVE 05/22/2022 0530   LEUKOCYTESUR NEGATIVE 05/22/2022 0530   Sepsis  Labs: @LABRCNTIP$ (procalcitonin:4,lacticidven:4) Microbiology: Recent Results (from the past 240 hour(s))  Resp panel by RT-PCR (RSV, Flu A&B, Covid) Anterior Nasal Swab     Status: None   Collection Time: 05/21/22 11:39 PM   Specimen: Anterior Nasal Swab  Result Value Ref Range Status   SARS Coronavirus 2 by RT PCR NEGATIVE NEGATIVE Final    Comment: (NOTE) SARS-CoV-2 target nucleic acids are NOT DETECTED.  The SARS-CoV-2 RNA is generally detectable in upper respiratory specimens during the acute phase of infection. The lowest concentration of SARS-CoV-2 viral copies this assay can detect is 138 copies/mL. A negative result does not preclude SARS-Cov-2 infection and should not be used as the sole basis for treatment or other patient management decisions. A negative result may occur with  improper specimen collection/handling, submission of specimen other than nasopharyngeal swab, presence of viral mutation(s) within the areas targeted by this assay, and inadequate number of viral copies(<138 copies/mL). A negative result must be combined with clinical observations, patient history, and epidemiological information. The expected result is Negative.  Fact Sheet for Patients:  EntrepreneurPulse.com.au  Fact Sheet for Healthcare Providers:  IncredibleEmployment.be  This test is no t yet approved or cleared by the [place] FDA and  has been authorized for detection and/or diagnosis of SARS-CoV-2 by FDA under an Emergency Use Authorization (EUA). This EUA will remain  in effect (meaning this test can be used) for the duration of the COVID-19 declaration under Section 564(b)(1) of the Act, 21 U.S.C.section 360bbb-3(b)(1), unless the authorization is terminated  or revoked sooner.       Influenza A by PCR NEGATIVE NEGATIVE Final   Influenza B by PCR NEGATIVE NEGATIVE Final    Comment: (NOTE) The Xpert Xpress SARS-CoV-2/FLU/RSV plus assay is  intended as an aid in the diagnosis of influenza from Nasopharyngeal swab specimens and should not be used as a sole basis for treatment. Nasal washings and aspirates are unacceptable for Xpert Xpress SARS-CoV-2/FLU/RSV testing.  Fact Sheet for Patients: EntrepreneurPulse.com.au  Fact Sheet for Healthcare Providers: IncredibleEmployment.be  This test is not yet approved or cleared by the [place] FDA and has been authorized for detection and/or diagnosis of SARS-CoV-2 by FDA under an Emergency Use Authorization (EUA). This EUA will remain in effect (meaning this test can be used) for the duration of the COVID-19 declaration under Section 564(b)(1) of the Act, 21 U.S.C. section 360bbb-3(b)(1), unless the authorization is terminated or revoked.     Resp Syncytial Virus by PCR NEGATIVE NEGATIVE Final    Comment: (NOTE) Fact Sheet for Patients: EntrepreneurPulse.com.au  Fact Sheet for Healthcare Providers: IncredibleEmployment.be  This test is not yet approved or cleared by the [place] FDA and has been authorized for detection and/or diagnosis of SARS-CoV-2 by FDA under an Emergency Use Authorization (EUA). This EUA will remain in effect (meaning this test can be used) for the duration of the COVID-19 declaration under Section 564(b)(1) of the Act, 21 U.S.C. section 360bbb-3(b)(1), unless the authorization is terminated or revoked.  Performed at [facility], [address] [place], [place] [address]   Blood Culture (routine x 2)     Status: None   Collection Time: 05/21/22 11:39 PM   Specimen: BLOOD  Result Value Ref Range Status   Specimen Description BLOOD LEFT  ANTECUBITAL  Final   Special Requests   Final    BOTTLES DRAWN AEROBIC AND ANAEROBIC Blood Culture adequate volume   Culture   Final    NO GROWTH 5 DAYS Performed at [facility], [address]  [place], [place] [address]    Report Status 05/26/2022 FINAL  Final  Blood Culture (routine x 2)     Status: None   Collection Time: 05/21/22 11:44 PM   Specimen: BLOOD  Result Value Ref Range Status   Specimen Description BLOOD RIGHT ANTECUBITAL  Final   Special Requests   Final    BOTTLES DRAWN AEROBIC AND ANAEROBIC Blood Culture results may not be optimal due to an excessive volume of blood received in culture bottles   Culture   Final    NO GROWTH 5 DAYS Performed at [facility], [address], [place], [place] [address]    Report Status 05/27/2022 FINAL  Final  MRSA Next Gen by PCR, Nasal     Status: None   Collection Time: 05/22/22 12:26 PM   Specimen: Nasal Mucosa; Nasal Swab  Result Value Ref Range Status   MRSA by PCR Next Gen NOT DETECTED NOT DETECTED Final    Comment: (NOTE) The GeneXpert MRSA Assay (FDA approved for NASAL specimens only), is one component of a comprehensive MRSA colonization surveillance program. It is not intended to diagnose MRSA infection nor to guide or monitor treatment for MRSA infections. Test performance is not FDA approved in patients less than 72 years old. Performed at [facility], [address] [place], [place] [address]   Aerobic/Anaerobic Culture w Gram Stain (surgical/deep wound)     Status: None   Collection Time: 05/22/22  4:38 PM   Specimen: Foot, Right; Abscess  Result Value Ref Range Status   Specimen Description   Final    FOOT Performed at [facility], [address], [place], [place] [address]    Special Requests   Final    RT FOOT ABSCESS Performed at [facility], [address] [place], [place] [address]    Gram Stain   Final    RARE WBC PRESENT,BOTH PMN AND MONONUCLEAR RARE GRAM POSITIVE COCCI IN PAIRS IN CLUSTERS    Culture   Final    MODERATE METHICILLIN RESISTANT STAPHYLOCOCCUS AUREUS NO ANAEROBES ISOLATED Performed at [facility], [address]  [place], [place] [address]    Report Status 05/27/2022 FINAL  Final   Organism ID, Bacteria METHICILLIN RESISTANT STAPHYLOCOCCUS AUREUS  Final      Susceptibility   Methicillin resistant staphylococcus aureus - MIC*    CIPROFLOXACIN <=0.5 SENSITIVE Sensitive     ERYTHROMYCIN >=8 RESISTANT Resistant     GENTAMICIN <=0.5 SENSITIVE Sensitive     OXACILLIN >=4 RESISTANT Resistant     TETRACYCLINE <=1 SENSITIVE Sensitive     VANCOMYCIN <=0.5 SENSITIVE Sensitive     TRIMETH/SULFA <=10 SENSITIVE Sensitive     CLINDAMYCIN <=0.25 SENSITIVE Sensitive     RIFAMPIN <=0.5 SENSITIVE Sensitive     Inducible Clindamycin NEGATIVE Sensitive     * MODERATE METHICILLIN RESISTANT STAPHYLOCOCCUS AUREUS  Aerobic/Anaerobic Culture w Gram Stain (surgical/deep wound)     Status: None (Preliminary result)   Collection Time: 05/27/22  3:59 PM   Specimen: PATH Other; Tissue  Result Value Ref Range Status   Specimen Description   Final    WOUND RIGHT FOOT AMPUTATION Performed at [facility], [address], [place], [place]  [place]    Special Requests   Final    NONE Performed at [facility], [place], [place] [address]    Gram Stain NO WBC SEEN NO ORGANISMS SEEN   Final   Culture   Final    NO GROWTH 2 DAYS Performed at [facility], [place] [address], [place], [place] [address]    Report Status PENDING  Incomplete      Radiology Studies last 3 days: DG Foot Complete Right  Result Date: 05/27/2022 CLINICAL DATA:  Postoperative check. Revision transmetatarsal amputation with delayed primary closure. EXAM: RIGHT FOOT COMPLETE - 3+ VIEW COMPARISON:  Right foot radiographs 05/22/2022 FINDINGS: There is a revision amputation of the first metatarsal to the proximal metaphysis. There appears to be no significant change in prior amputation to the proximal shafts of the second through fifth metatarsals. There are distal foot surgical skin staples. Expected postoperative  subcutaneous air is seen. There is again mild high-grade chronic enthesopathic change at the plantar fascia origin. Mild chronic spurring at the Achilles tendon insertion. No cortical erosion is visualized. IMPRESSION: Status post revision amputation of the first metatarsal to the proximal metaphysis. There is a sharp resection border. Electronically Signed   By: Yvonne Kendall M.D.   On: 05/27/2022 18:38   DG MINI C-ARM IMAGE ONLY  Result Date: 05/27/2022 There is no interpretation for this exam.  This order is for images obtained during a surgical procedure.  Please See "Surgeries" Tab for more information regarding the procedure.             LOS: 7 days       Emeterio Reeve, DO [facility] 05/29/2022, 11:58 AM    Dictation software may have been used to generate the above note. Typos may occur and escape review in typed/dictated notes. Please contact Dr Sheppard Coil directly for clarity if needed.  Staff may message me via secure chat in [place]  but this may not receive an immediate response,  please page me for urgent matters!  If 7PM-7AM, please contact night coverage [email or web address]

## 2022-05-29 NOTE — Plan of Care (Signed)
  Problem: Education: Goal: Knowledge of General Education information will improve Description: Including pain rating scale, medication(s)/side effects and non-pharmacologic comfort measures Outcome: Progressing   Problem: Health Behavior/Discharge Planning: Goal: Ability to manage health-related needs will improve Outcome: Progressing   Problem: Clinical Measurements: Goal: Ability to maintain clinical measurements within normal limits will improve Outcome: Progressing Goal: Will remain free from infection Outcome: Progressing   Problem: Nutrition: Goal: Adequate nutrition will be maintained Outcome: Progressing   Problem: Pain Managment: Goal: General experience of comfort will improve Outcome: Progressing   Problem: Safety: Goal: Ability to remain free from injury will improve Outcome: Progressing   Problem: Skin Integrity: Goal: Risk for impaired skin integrity will decrease Outcome: Progressing

## 2022-05-29 NOTE — Progress Notes (Signed)
PT Cancellation Note  Patient Details Name: [name] [name] MRN: [id number] DOB: 1965-10-31   Cancelled Treatment:    Reason Eval/Treat Not Completed: Patient declined, no reason specified  Made 2 attempts to treat pt. Pt declined reporting that she did not feel good.  Educated pt on benefits of PT.  PT will continue to follow up/treat  pt when she is willing.   Bjorn Loser, PTA  05/29/22, 1:05 PM

## 2022-05-30 DIAGNOSIS — L089 Local infection of the skin and subcutaneous tissue, unspecified: Secondary | ICD-10-CM | POA: Diagnosis not present

## 2022-05-30 LAB — BASIC METABOLIC PANEL
Anion gap: 8 (ref 5–15)
BUN: 29 mg/dL — ABNORMAL HIGH (ref 6–20)
CO2: 20 mmol/L — ABNORMAL LOW (ref 22–32)
Calcium: 8.4 mg/dL — ABNORMAL LOW (ref 8.9–10.3)
Chloride: 104 mmol/L (ref 98–111)
Creatinine, Ser: 1.97 mg/dL — ABNORMAL HIGH (ref 0.44–1.00)
GFR, Estimated: 29 mL/min — ABNORMAL LOW (ref >60)
Glucose, Bld: 111 mg/dL — ABNORMAL HIGH (ref 70–99)
Potassium: 3.6 mmol/L (ref 3.5–5.1)
Sodium: 132 mmol/L — ABNORMAL LOW (ref 135–145)

## 2022-05-30 LAB — GLUCOSE, CAPILLARY
Glucose-Capillary: 97 mg/dL (ref 70–99)
Glucose-Capillary: 104 mg/dL — ABNORMAL HIGH (ref 70–99)
Glucose-Capillary: 116 mg/dL — ABNORMAL HIGH (ref 70–99)
Glucose-Capillary: 116 mg/dL — ABNORMAL HIGH (ref 70–99)

## 2022-05-30 LAB — CBC
HCT: 28.2 % — ABNORMAL LOW (ref 36.0–46.0)
Hemoglobin: 9.3 g/dL — ABNORMAL LOW (ref 12.0–15.0)
MCH: 31.6 pg (ref 26.0–34.0)
MCHC: 33 g/dL (ref 30.0–36.0)
MCV: 95.9 fL (ref 80.0–100.0)
Platelets: 325 10*3/uL (ref 150–400)
RBC: 2.94 MIL/uL — ABNORMAL LOW (ref 3.87–5.11)
RDW: 14.2 % (ref 11.5–15.5)
WBC: 8.9 10*3/uL (ref 4.0–10.5)
nRBC: 0 % (ref 0.0–0.2)

## 2022-05-30 LAB — VANCOMYCIN, RANDOM: Vancomycin Rm: 34 ug/mL

## 2022-05-30 MED ORDER — VANCOMYCIN VARIABLE DOSE PER UNSTABLE RENAL FUNCTION (PHARMACIST DOSING): Status: DC

## 2022-05-30 NOTE — Progress Notes (Signed)
PROGRESS NOTE    CHINDA SHINABERY   [id number] DOB: 16-Jun-1965  DOA: 05/21/2022 Date of Service: 05/30/22 PCP: [name], MD     Brief Narrative / Hospital Course:  Carolynne Orban is 57 year old female with history of bipolar disorder, total arthroplasty right shoulder December 2023, chronic pain syndrome, anxiety depression, who presented to emergency room 05/21/22 with altered mental status and redness of the right foot amputation stump, was on Keflex at home, TMA 2 mos prior.  02/02: to ED as above. Septic shock, required brief levo gtt. 02/03: admitted to hospitalist service, underwent I&D w/ podiatry 02/04: stable on IV abx awaiting Cx 02/05: back to OR for revision I&D.   02/06-02/07: stable, planning back to OR 02/08. Cultures from 02/03 (+)MRSA 02/08: to OR for revision of RLE TMA. Per ID, continue vanco and send bone for pathology and culture  02/09: podiatry and ID recs for continue inpatient for now to ensure surgical stabilization and await culture results to guide abx, will likely be here thru the weekend (today is Friday)   02/10-02/11: stable, awaiting cultures, podiatry examined this AM see photos, bandages in place per their orders to change dressing at follow-up - advising keep thru weekend possible d/c Mon 02/12 pending surgical pathology results    Consultants:  Podiatry  Infectious Disease   Procedures: 05/22/22: I&D RLE TMA stump 05/24/22: I&D w/ revision/washout w/ primary delayed closure RLE TMA stump 05/27/22 RLE repeat washout, revisional transmetatarsal amputation and resection of necrotic bone and soft tissue and primary closure of the amputation site per podiatry      ASSESSMENT & PLAN:   Principal Problem:   Cellulitis of right foot Active Problems:   Severe sepsis ([place])   GERD (gastroesophageal reflux disease)   Essential hypertension   AMS (altered mental status)   Septic shock ([place])   Abscess of right foot  Septic shock - POA  - resolved  Right foot cellulitis and abscess at site of prior TMA with history of prior ulceration (+)MRSA S/p return to the OR on 05/27/2022 for repeat washout, revisional amputation and resection of necrotic bone and soft tissue and primary closure of the amputation site.  cont IV vanc - cultures NG x2d Surgical path pending  Podiatry and ID following    Depression anxiety bipolar disorder history of seizure disorder cont Buspar, Cymbalta and Lexapro  cont lamotrigine and Topamax   Chronic pain syndrome recent right shoulder arthroplasty cont home Percocet and Lyrica   Morbid obesity, BMI 40 Complicates treatment and prognosis  Sleep apnea CPAP nightly     DVT prophylaxis: lovenox   Pertinent IV fluids/nutrition: no continuous IV fluids  Central lines / invasive devices: none  Code Status: FULL CODE  Current Admission Status: inpatient  TOC needs / Dispo plan: none at this time Barriers to discharge / significant pending items: surgery POD2, PT/OT to follow. Podiatry and ID clearance prior to d/c - advise stay thru the weekend to ensure stbilization and await cultures              Subjective / Brief ROS:  Patient reports no concerns at this time Denies CP/SOB. Pain controlled though foot is sore - better compared to yesterday  Denies new weakness.  Family Communication: none at this time     Objective Findings:  Vitals:   05/29/22 0815 05/29/22 1550 05/29/22 2335 05/30/22 0736  BP: 132/79 118/75 120/74 124/74  Pulse: 72 76 75 70  Resp: 17 18 18 $ 16  Temp: 98 F (36.7 C) 98.4 F (36.9 C) 98.2 F (36.8 C) 98 F (36.7 C)  TempSrc:      SpO2: 96% 97% 97% 96%  Weight:      Height:        Intake/Output Summary (Last 24 hours) at 05/30/2022 1139 Last data filed at 05/30/2022 0300 Gross per 24 hour  Intake 400 ml  Output --  Net 400 ml   Filed Weights   05/22/22 1221 05/24/22 1637 05/27/22 1457  Weight: 108.1 kg 108 kg 104.3 kg     Examination:  Physical Exam Constitutional:      Appearance: She is obese.  Cardiovascular:     Rate and Rhythm: Normal rate and regular rhythm.     Heart sounds: Normal heart sounds.  Pulmonary:     Effort: Pulmonary effort is normal.     Breath sounds: Normal breath sounds.  Musculoskeletal:     Right lower leg: No edema (R foot bandage - clean, was not removed).     Left lower leg: No edema.  Neurological:     Mental Status: She is alert.          Scheduled Medications:   aspirin  81 mg Oral BID   busPIRone  15 mg Oral TID   Chlorhexidine Gluconate Cloth  6 each Topical Q0600   DULoxetine  20 mg Oral Daily   enoxaparin (LOVENOX) injection  0.5 mg/kg Subcutaneous Q24H   escitalopram  10 mg Oral Daily   estradiol  1 mg Oral QHS   insulin aspart  0-6 Units Subcutaneous TID WC   lamoTRIgine  100 mg Oral Daily   naloxegol oxalate  25 mg Oral Daily   pregabalin  100 mg Oral TID   sodium chloride flush  3 mL Intravenous Q12H   topiramate  50 mg Oral BID   vancomycin variable dose per unstable renal function (pharmacist dosing)   Does not apply See admin instructions    Continuous Infusions:  sodium chloride Stopped (05/24/22 1920)    PRN Medications:  acetaminophen **OR** acetaminophen, albuterol, ondansetron (ZOFRAN) IV, mouth rinse, oxyCODONE-acetaminophen  Antimicrobials from admission:  Anti-infectives (From admission, onward)    Start     Dose/Rate Route Frequency Ordered Stop   05/30/22 0734  vancomycin variable dose per unstable renal function (pharmacist dosing)         Does not apply See admin instructions 05/30/22 0734     05/28/22 1000  vancomycin (VANCOCIN) IVPB 1000 mg/200 mL premix  Status:  Discontinued        1,000 mg 200 mL/hr over 60 Minutes Intravenous Every 12 hours 05/28/22 0820 05/30/22 0734   05/25/22 1000  vancomycin (VANCOREADY) IVPB 1500 mg/300 mL  Status:  Discontinued        1,500 mg 150 mL/hr over 120 Minutes Intravenous Every  24 hours 05/25/22 0903 05/28/22 0820   05/24/22 1810  vancomycin (VANCOCIN) powder  Status:  Discontinued          As needed 05/24/22 1810 05/24/22 1824   05/24/22 1230  Ampicillin-Sulbactam (UNASYN) 3 g in sodium chloride 0.9 % 100 mL IVPB  Status:  Discontinued        3 g 200 mL/hr over 30 Minutes Intravenous Every 6 hours 05/24/22 1142 05/26/22 1040   05/23/22 0000  cefTRIAXone (ROCEPHIN) 2 g in sodium chloride 0.9 % 100 mL IVPB  Status:  Discontinued        2 g 200 mL/hr over 30 Minutes Intravenous Every  24 hours 05/22/22 0127 05/22/22 1139   05/22/22 1400  clindamycin (CLEOCIN) IVPB 600 mg  Status:  Discontinued        600 mg 100 mL/hr over 30 Minutes Intravenous Every 8 hours 05/22/22 1142 05/23/22 1311   05/22/22 1300  meropenem (MERREM) 1 g in sodium chloride 0.9 % 100 mL IVPB  Status:  Discontinued        1 g 200 mL/hr over 30 Minutes Intravenous Every 8 hours 05/22/22 1219 05/24/22 1142   05/22/22 0300  vancomycin (VANCOREADY) IVPB 1500 mg/300 mL  Status:  Discontinued        1,500 mg 150 mL/hr over 120 Minutes Intravenous Every 24 hours 05/22/22 0209 05/24/22 1235   05/22/22 0000  cefTRIAXone (ROCEPHIN) 2 g in sodium chloride 0.9 % 100 mL IVPB        2 g 200 mL/hr over 30 Minutes Intravenous STAT 05/21/22 2351 05/22/22 0110   05/21/22 2345  vancomycin (VANCOCIN) IVPB 1000 mg/200 mL premix        1,000 mg 200 mL/hr over 60 Minutes Intravenous  Once 05/21/22 2340 05/22/22 0110   05/21/22 2345  aztreonam (AZACTAM) 2 g in sodium chloride 0.9 % 100 mL IVPB  Status:  Discontinued        2 g 200 mL/hr over 30 Minutes Intravenous  Once 05/21/22 2340 05/21/22 2351           Data Reviewed:  I have personally reviewed the following...  CBC: Recent Labs  Lab 05/25/22 0256 05/26/22 0147 05/27/22 0144 05/28/22 0502 05/30/22 0321  WBC 11.3* 10.5 12.1* 9.0 8.9  HGB 10.0* 10.8* 10.6* 10.1* 9.3*  HCT 30.6* 33.2* 32.1* 31.4* 28.2*  MCV 95.9 96.5 95.8 96.3 95.9  PLT 254 296  309 338 [id number]   Basic Metabolic Panel: Recent Labs  Lab 05/24/22 0231 05/25/22 0256 05/26/22 0147 05/27/22 0144 05/28/22 0502 05/30/22 0321  NA 136 134* 140 136 134* 132*  K 3.7 3.7 3.9 3.4* 4.2 3.6  CL 107 103 105 106 105 104  CO2 20* 26 22 23 24 $ 20*  GLUCOSE 109* 88 96 107* 113* 111*  BUN 16 12 10 9 13 $ 29*  CREATININE 0.62 0.69 0.55 0.51 0.69 1.97*  CALCIUM 8.5* 8.3* 8.5* 8.6* 8.5* 8.4*  MG 2.2 2.0 2.2 2.3  --   --    GFR: Estimated Creatinine Clearance: 36.6 mL/min (A) (by C-G formula based on SCr of 1.97 mg/dL (H)). Liver Function Tests: No results for input(s): "AST", "ALT", "ALKPHOS", "BILITOT", "PROT", "ALBUMIN" in the last 168 hours.  No results for input(s): "LIPASE", "AMYLASE" in the last 168 hours.  No results for input(s): "AMMONIA" in the last 168 hours. Coagulation Profile: No results for input(s): "INR", "PROTIME" in the last 168 hours.  Cardiac Enzymes: No results for input(s): "CKTOTAL", "CKMB", "CKMBINDEX", "TROPONINI" in the last 168 hours. BNP (last 3 results) No results for input(s): "PROBNP" in the last 8760 hours. HbA1C: No results for input(s): "HGBA1C" in the last 72 hours. CBG: Recent Labs  Lab 05/29/22 1143 05/29/22 1705 05/29/22 2111 05/30/22 0756 05/30/22 1114  GLUCAP 105* 92 115* 97 104*   Lipid Profile: No results for input(s): "CHOL", "HDL", "LDLCALC", "TRIG", "CHOLHDL", "LDLDIRECT" in the last 72 hours. Thyroid Function Tests: No results for input(s): "TSH", "T4TOTAL", "FREET4", "T3FREE", "THYROIDAB" in the last 72 hours. Anemia Panel: No results for input(s): "VITAMINB12", "FOLATE", "FERRITIN", "TIBC", "IRON", "RETICCTPCT" in the last 72 hours. Most Recent Urinalysis On File:  Component Value Date/Time   COLORURINE YELLOW (A) 05/22/2022 0530   APPEARANCEUR CLEAR (A) 05/22/2022 0530   LABSPEC 1.005 05/22/2022 0530   PHURINE 6.0 05/22/2022 0530   GLUCOSEU NEGATIVE 05/22/2022 0530   HGBUR NEGATIVE 05/22/2022 0530    BILIRUBINUR NEGATIVE 05/22/2022 0530   KETONESUR NEGATIVE 05/22/2022 0530   PROTEINUR NEGATIVE 05/22/2022 0530   UROBILINOGEN 0.2 06/23/2017 1108   NITRITE NEGATIVE 05/22/2022 0530   LEUKOCYTESUR NEGATIVE 05/22/2022 0530   Sepsis Labs: @LABRCNTIP$ (procalcitonin:4,lacticidven:4) Microbiology: Recent Results (from the past 240 hour(s))  Resp panel by RT-PCR (RSV, Flu A&B, Covid) Anterior Nasal Swab     Status: None   Collection Time: 05/21/22 11:39 PM   Specimen: Anterior Nasal Swab  Result Value Ref Range Status   SARS Coronavirus 2 by RT PCR NEGATIVE NEGATIVE Final    Comment: (NOTE) SARS-CoV-2 target nucleic acids are NOT DETECTED.  The SARS-CoV-2 RNA is generally detectable in upper respiratory specimens during the acute phase of infection. The lowest concentration of SARS-CoV-2 viral copies this assay can detect is 138 copies/mL. A negative result does not preclude SARS-Cov-2 infection and should not be used as the sole basis for treatment or other patient management decisions. A negative result may occur with  improper specimen collection/handling, submission of specimen other than nasopharyngeal swab, presence of viral mutation(s) within the areas targeted by this assay, and inadequate number of viral copies(<138 copies/mL). A negative result must be combined with clinical observations, patient history, and epidemiological information. The expected result is Negative.  Fact Sheet for Patients:  EntrepreneurPulse.com.au  Fact Sheet for Healthcare Providers:  IncredibleEmployment.be  This test is no t yet approved or cleared by the [place] FDA and  has been authorized for detection and/or diagnosis of SARS-CoV-2 by FDA under an Emergency Use Authorization (EUA). This EUA will remain  in effect (meaning this test can be used) for the duration of the COVID-19 declaration under Section 564(b)(1) of the Act, 21 U.S.C.section  360bbb-3(b)(1), unless the authorization is terminated  or revoked sooner.       Influenza A by PCR NEGATIVE NEGATIVE Final   Influenza B by PCR NEGATIVE NEGATIVE Final    Comment: (NOTE) The Xpert Xpress SARS-CoV-2/FLU/RSV plus assay is intended as an aid in the diagnosis of influenza from Nasopharyngeal swab specimens and should not be used as a sole basis for treatment. Nasal washings and aspirates are unacceptable for Xpert Xpress SARS-CoV-2/FLU/RSV testing.  Fact Sheet for Patients: EntrepreneurPulse.com.au  Fact Sheet for Healthcare Providers: IncredibleEmployment.be  This test is not yet approved or cleared by the [place] FDA and has been authorized for detection and/or diagnosis of SARS-CoV-2 by FDA under an Emergency Use Authorization (EUA). This EUA will remain in effect (meaning this test can be used) for the duration of the COVID-19 declaration under Section 564(b)(1) of the Act, 21 U.S.C. section 360bbb-3(b)(1), unless the authorization is terminated or revoked.     Resp Syncytial Virus by PCR NEGATIVE NEGATIVE Final    Comment: (NOTE) Fact Sheet for Patients: EntrepreneurPulse.com.au  Fact Sheet for Healthcare Providers: IncredibleEmployment.be  This test is not yet approved or cleared by the [place] FDA and has been authorized for detection and/or diagnosis of SARS-CoV-2 by FDA under an Emergency Use Authorization (EUA). This EUA will remain in effect (meaning this test can be used) for the duration of the COVID-19 declaration under Section 564(b)(1) of the Act, 21 U.S.C. section 360bbb-3(b)(1), unless the authorization is terminated or revoked.  Performed at [facility], [address] [place], [place] [address]   Blood Culture (routine x 2)     Status: None   Collection Time: 05/21/22 11:39 PM   Specimen: BLOOD  Result Value Ref Range Status   Specimen  Description BLOOD LEFT ANTECUBITAL  Final   Special Requests   Final    BOTTLES DRAWN AEROBIC AND ANAEROBIC Blood Culture adequate volume   Culture   Final    NO GROWTH 5 DAYS Performed at [facility], [address] [place], [place] [address]    Report Status 05/26/2022 FINAL  Final  Blood Culture (routine x 2)     Status: None   Collection Time: 05/21/22 11:44 PM   Specimen: BLOOD  Result Value Ref Range Status   Specimen Description BLOOD RIGHT ANTECUBITAL  Final   Special Requests   Final    BOTTLES DRAWN AEROBIC AND ANAEROBIC Blood Culture results may not be optimal due to an excessive volume of blood received in culture bottles   Culture   Final    NO GROWTH 5 DAYS Performed at [facility], [address], [place], [place] [address]    Report Status 05/27/2022 FINAL  Final  MRSA Next Gen by PCR, Nasal     Status: None   Collection Time: 05/22/22 12:26 PM   Specimen: Nasal Mucosa; Nasal Swab  Result Value Ref Range Status   MRSA by PCR Next Gen NOT DETECTED NOT DETECTED Final    Comment: (NOTE) The GeneXpert MRSA Assay (FDA approved for NASAL specimens only), is one component of a comprehensive MRSA colonization surveillance program. It is not intended to diagnose MRSA infection nor to guide or monitor treatment for MRSA infections. Test performance is not FDA approved in patients less than 25 years old. Performed at [facility], [address] [place], [place] [address]   Aerobic/Anaerobic Culture w Gram Stain (surgical/deep wound)     Status: None   Collection Time: 05/22/22  4:38 PM   Specimen: Foot, Right; Abscess  Result Value Ref Range Status   Specimen Description   Final    FOOT Performed at [facility], [address], [place], [place] [address]    Special Requests   Final    RT FOOT ABSCESS Performed at [facility], [address] [place], [place] [address]    Gram Stain   Final    RARE WBC  PRESENT,BOTH PMN AND MONONUCLEAR RARE GRAM POSITIVE COCCI IN PAIRS IN CLUSTERS    Culture   Final    MODERATE METHICILLIN RESISTANT STAPHYLOCOCCUS AUREUS NO ANAEROBES ISOLATED Performed at [facility], [address], [place], [place] [address]    Report Status 05/27/2022 FINAL  Final   Organism ID, Bacteria METHICILLIN RESISTANT STAPHYLOCOCCUS AUREUS  Final      Susceptibility   Methicillin resistant staphylococcus aureus - MIC*    CIPROFLOXACIN <=0.5 SENSITIVE Sensitive     ERYTHROMYCIN >=8 RESISTANT Resistant     GENTAMICIN <=0.5 SENSITIVE Sensitive     OXACILLIN >=4 RESISTANT Resistant     TETRACYCLINE <=1 SENSITIVE Sensitive     VANCOMYCIN <=0.5 SENSITIVE Sensitive     TRIMETH/SULFA <=10 SENSITIVE Sensitive     CLINDAMYCIN <=0.25 SENSITIVE Sensitive     RIFAMPIN <=0.5 SENSITIVE Sensitive     Inducible Clindamycin NEGATIVE Sensitive     * MODERATE METHICILLIN RESISTANT STAPHYLOCOCCUS AUREUS  Aerobic/Anaerobic Culture w Gram Stain (surgical/deep wound)     Status: None (Preliminary result)  Collection Time: 05/27/22  3:59 PM   Specimen: PATH Other; Tissue  Result Value Ref Range Status   Specimen Description   Final    WOUND RIGHT FOOT AMPUTATION Performed at [facility], [place] [address] [place], [place] [address]    Special Requests   Final    NONE Performed at [facility], [place], [place] [address]    Gram Stain NO WBC SEEN NO ORGANISMS SEEN   Final   Culture   Final    NO GROWTH 2 DAYS NO ANAEROBES ISOLATED; CULTURE IN PROGRESS FOR 5 DAYS Performed at [facility], [place] [address] [place], [place] [address]    Report Status PENDING  Incomplete      Radiology Studies last 3 days: DG Foot Complete Right  Result Date: 05/27/2022 CLINICAL DATA:  Postoperative check. Revision transmetatarsal amputation with delayed primary closure. EXAM: RIGHT FOOT COMPLETE - 3+ VIEW COMPARISON:  Right foot radiographs 05/22/2022  FINDINGS: There is a revision amputation of the first metatarsal to the proximal metaphysis. There appears to be no significant change in prior amputation to the proximal shafts of the second through fifth metatarsals. There are distal foot surgical skin staples. Expected postoperative subcutaneous air is seen. There is again mild high-grade chronic enthesopathic change at the plantar fascia origin. Mild chronic spurring at the Achilles tendon insertion. No cortical erosion is visualized. IMPRESSION: Status post revision amputation of the first metatarsal to the proximal metaphysis. There is a sharp resection border. Electronically Signed   By: Yvonne Kendall M.D.   On: 05/27/2022 18:38   DG MINI C-ARM IMAGE ONLY  Result Date: 05/27/2022 There is no interpretation for this exam.  This order is for images obtained during a surgical procedure.  Please See "Surgeries" Tab for more information regarding the procedure.             LOS: 8 days       Emeterio Reeve, DO [facility] 05/30/2022, 11:39 AM    Dictation software may have been used to generate the above note. Typos may occur and escape review in typed/dictated notes. Please contact Dr Sheppard Coil directly for clarity if needed.  Staff may message me via secure chat in [place]  but this may not receive an immediate response,  please page me for urgent matters!  If 7PM-7AM, please contact night coverage [email or web address]

## 2022-05-30 NOTE — Consult Note (Signed)
Pharmacy Antibiotic Note  [name] [name] is a 57 y.o. female admitted on 05/21/2022 with AMS and right foot stump pain and redness. 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 Taking PO cephalexin PTA and recent Rx for PO bactrim 05/20/22 (unsure if started). Pharmacy has been consulted for vancomycin dosing.   - vancomycin stopped 2/5 as previous culture with MSSA, current culture with MRSA so resume vancomycin. Last dose was 2/5 at 0400  Today, 05/30/2022 Day #9 antibiotics Renal: NEW AKI WBC improved to WNL Afebrile OR 2/3 for I&D and 2/5 repeat I&D (wound remains open) Does not appear to be bone involvement per podiatry 2/3 OR culture: MRSA - susc to tetracycline (allergy listed) and TMP/SMZ.  Final culture pending Returned to OR 2/8 revision of TMA and wound closure Cultures and pathology sent and pending  Vancomycin levels: Vancomycin 1547m IV q24h (dose 2/8 at a10:04), peak = 27 mcg/ml at 12:29 2/8 and random vancomycin level = 7 mcg/ml at 05:02 2/9 AUC = 352.8 w/ Cmax = 30.2 mcg/ml and Cmin = 5.4 mcg/mL  Plan: Discontinue vancomycin 1 g IV q12h Scr 0.69 >> 1.97 Vanc random (essentially a trough) = 34 (elevated) Re-check Scr tomorrow AM along with random vancomycin level Follow wound cultures and pathology   Height: 5' 4"$  (162.6 cm) Weight: 104.3 kg (230 lb) IBW/kg (Calculated) : 54.7  Temp (24hrs), Avg:98.2 F (36.8 C), Min:98 F (36.7 C), Max:98.4 F (36.9 C)  Recent Labs  Lab 05/25/22 0256 05/26/22 0147 05/27/22 0144 05/27/22 1329 05/28/22 0502 05/30/22 0321 05/30/22 0858  WBC 11.3* 10.5 12.1*  --  9.0 8.9  --   CREATININE 0.69 0.55 0.51  --  0.69 1.97*  --   VANCOPEAK  --   --   --  27*  --   --   --   VANCORANDOM  --   --   --   --  8  --  34     Estimated Creatinine Clearance: 36.6 mL/min (A) (by C-G formula based on SCr of 1.97 mg/dL (H)).    Allergies  Allergen Reactions   Other    Tetracyclines & Related  Other (See Comments)    Syncope and put her "in a coma"   Tramadol Other (See Comments)    Seizures   Phenazopyridine Other (See Comments)    Unknown   Tetracycline Other (See Comments)    Syncope and "Put me in a coma"   Ciprofloxacin Rash and Itching   Codeine Itching and Rash   Estradiol Rash    Patches broke out the skin    Antimicrobials this admission: 2/5 Unasyn >> 2/7 2/3 Vancomycin >>  2/3 Meropenem >>2/5 2/3 Ceftriaxone x 1 dose   Dose adjustments this admission:   Microbiology results: 2/2 BCx: NGTD 2/3: R foot abscess cx: mod MRSA 2/3 MRSA PCR neg 2/8 Foot amputation: NGTD  Thank you for allowing pharmacy to be a part of this patient's care.  ABenita Gutter 05/30/2022 9:53 AM

## 2022-05-30 NOTE — Plan of Care (Signed)
  Problem: Clinical Measurements: Goal: Ability to maintain clinical measurements within normal limits will improve Outcome: Progressing   Problem: Education: Goal: Knowledge of General Education information will improve Description: Including pain rating scale, medication(s)/side effects and non-pharmacologic comfort measures Outcome: Progressing   Problem: Elimination: Goal: Will not experience complications related to bowel motility Outcome: Progressing   Problem: Coping: Goal: Ability to adjust to condition or change in health will improve Outcome: Progressing   Problem: Education: Goal: Ability to describe self-care measures that may prevent or decrease complications (Diabetes Survival Skills Education) will improve Outcome: Progressing   Problem: Pain Managment: Goal: General experience of comfort will improve Outcome: Progressing

## 2022-05-30 NOTE — Progress Notes (Signed)
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    PT Comments    Pt agrees to session.  Participated in exercises as described below.  Stated she has not been doing and HEP on her own.  Reviewed supine and seated EX and returned demo and encouraged 3-4x per day.  Voiced understanding but anticipate that she will need continued encouragement.  She is able to get to EOB on her own then stand with RW and transfer to chair at bedside.  She is able to recall NWB status but does seen to do some TDWB for balance.  Stood a second time for standing RLE x 10 but limited by fatigue and R shoulder pain from prior TSA.  Remained in chair after session with needs met.   If pt is to discharge home, she will need a RW, BSC and wheelchair as she is unable to maintain NWB and walk household distances.    Patient suffers from foot amputation which impairs his/her ability to perform daily activities like toileting, feeding, dressing, grooming, bathing in the home. A cane, walker, crutch will not resolve the patient's issue with performing activities of daily living. A lightweight wheelchair and cushion is required/recommended and will allow patient to safely perform daily activities.   Patient can safely propel the wheelchair in the home or has a caregiver who can provide assistance.    SNF would be appropriate if available to her.   Recommendations for follow up therapy are one component of a multi-disciplinary discharge planning process, led by the attending physician.  Recommendations may be updated based on patient status, additional  functional criteria and insurance authorization.  Follow Up Recommendations  Follow physician's recommendations for discharge plan and follow up therapies     Assistance Recommended at Discharge Intermittent Supervision/Assistance  Patient can return home with the following Assistance with cooking/housework;Assist for transportation;Help with stairs or ramp for entrance;A little help with walking and/or transfers;A little help with bathing/dressing/bathroom   Equipment Recommendations  Wheelchair (measurements PT);Wheelchair cushion (measurements PT);Rolling walker (2 wheels);BSC/3in1    Recommendations for Other Services       Precautions / Restrictions Precautions Precautions: Fall Restrictions Weight Bearing Restrictions: Yes RUE Weight Bearing: Weight bearing as tolerated RLE Weight Bearing: Non weight bearing     Mobility  Bed Mobility Overal bed mobility: Needs Assistance Bed Mobility: Supine to Sit     Supine to sit: Min guard       Patient Response: Flat affect  Transfers Overall transfer level: Needs assistance Equipment used: Rolling walker (2 wheels) Transfers: Sit to/from Stand Sit to Stand: Min assist                Ambulation/Gait Ambulation/Gait assistance: Herbalist (Feet): 3 Feet Assistive device: Rolling walker (2 wheels) Gait Pattern/deviations: Step-to pattern Gait velocity: dec     General Gait Details: is able to recall NWB status but does seem to do some TDWB so limited to transfer to chair only   Stairs             Wheelchair Mobility    Modified Rankin (Stroke Patients Only)       Balance Overall balance assessment:  Needs assistance Sitting-balance support: No upper extremity supported Sitting balance-Leahy Scale: Good     Standing balance support: Bilateral upper extremity supported Standing balance-Leahy Scale: Fair                              Cognition Arousal/Alertness:  Awake/alert Behavior During Therapy: WFL for tasks assessed/performed Overall Cognitive Status: Within Functional Limits for tasks assessed                                          Exercises Other Exercises Other Exercises: supine and seated AROM BLE x 10 - enocuraged HEP on her own during the day    General Comments        Pertinent Vitals/Pain Pain Assessment Pain Assessment: Faces Faces Pain Scale: Hurts little more Pain Location: R shoulder Pain Descriptors / Indicators: Aching, Sore Pain Intervention(s): Limited activity within patient's tolerance, Repositioned, Monitored during session    Home Living                          Prior Function            PT Goals (current goals can now be found in the care plan section) Progress towards PT goals: Progressing toward goals    Frequency    7X/week      PT Plan Current plan remains appropriate    Co-evaluation              AM-PAC PT "6 Clicks" Mobility   Outcome Measure  Help needed turning from your back to your side while in a flat bed without using bedrails?: None Help needed moving from lying on your back to sitting on the side of a flat bed without using bedrails?: None Help needed moving to and from a bed to a chair (including a wheelchair)?: A Little Help needed standing up from a chair using your arms (e.g., wheelchair or bedside chair)?: A Little Help needed to walk in hospital room?: A Little Help needed climbing 3-5 steps with a railing? : Total 6 Click Score: 18    End of Session Equipment Utilized During Treatment: Gait belt Activity Tolerance: Patient tolerated treatment well Patient left: with call bell/phone within reach;with chair alarm set;in chair Nurse Communication: Mobility status PT Visit Diagnosis: Muscle weakness (generalized) (M62.81);Difficulty in walking, not elsewhere classified (R26.2)     Time: [id number] PT Time Calculation (min) (ACUTE  ONLY): 12 min  Charges:  $Therapeutic Exercise: 8-22 mins                    Chesley Noon, PTA 05/30/22, 10:30 AM

## 2022-05-31 ENCOUNTER — Ambulatory Visit: Payer: 59 | Admitting: Podiatry

## 2022-05-31 DIAGNOSIS — L089 Local infection of the skin and subcutaneous tissue, unspecified: Secondary | ICD-10-CM | POA: Diagnosis not present

## 2022-05-31 LAB — CREATININE, URINE, RANDOM: Creatinine, Urine: 203 mg/dL

## 2022-05-31 LAB — URINALYSIS, W/ REFLEX TO CULTURE (INFECTION SUSPECTED)
Bilirubin Urine: NEGATIVE
Glucose, UA: NEGATIVE mg/dL
Ketones, ur: NEGATIVE mg/dL
Leukocytes,Ua: NEGATIVE
Nitrite: NEGATIVE
Protein, ur: 100 mg/dL — AB
Specific Gravity, Urine: 1.017 (ref 1.005–1.030)
pH: 5 (ref 5.0–8.0)

## 2022-05-31 LAB — GLUCOSE, CAPILLARY
Glucose-Capillary: 113 mg/dL — ABNORMAL HIGH (ref 70–99)
Glucose-Capillary: 106 mg/dL — ABNORMAL HIGH (ref 70–99)
Glucose-Capillary: 92 mg/dL (ref 70–99)
Glucose-Capillary: 102 mg/dL — ABNORMAL HIGH (ref 70–99)

## 2022-05-31 LAB — BASIC METABOLIC PANEL
Anion gap: 9 (ref 5–15)
Anion gap: 10 (ref 5–15)
Anion gap: 9 (ref 5–15)
Anion gap: 9 (ref 5–15)
BUN: 48 mg/dL — ABNORMAL HIGH (ref 6–20)
BUN: 53 mg/dL — ABNORMAL HIGH (ref 6–20)
BUN: 57 mg/dL — ABNORMAL HIGH (ref 6–20)
BUN: 58 mg/dL — ABNORMAL HIGH (ref 6–20)
CO2: 20 mmol/L — ABNORMAL LOW (ref 22–32)
CO2: 19 mmol/L — ABNORMAL LOW (ref 22–32)
CO2: 19 mmol/L — ABNORMAL LOW (ref 22–32)
CO2: 19 mmol/L — ABNORMAL LOW (ref 22–32)
Calcium: 8.6 mg/dL — ABNORMAL LOW (ref 8.9–10.3)
Calcium: 8.8 mg/dL — ABNORMAL LOW (ref 8.9–10.3)
Calcium: 8.5 mg/dL — ABNORMAL LOW (ref 8.9–10.3)
Calcium: 8 mg/dL — ABNORMAL LOW (ref 8.9–10.3)
Chloride: 101 mmol/L (ref 98–111)
Chloride: 99 mmol/L (ref 98–111)
Chloride: 97 mmol/L — ABNORMAL LOW (ref 98–111)
Chloride: 98 mmol/L (ref 98–111)
Creatinine, Ser: 3.22 mg/dL — ABNORMAL HIGH (ref 0.44–1.00)
Creatinine, Ser: 3.56 mg/dL — ABNORMAL HIGH (ref 0.44–1.00)
Creatinine, Ser: 3.54 mg/dL — ABNORMAL HIGH (ref 0.44–1.00)
Creatinine, Ser: 3.55 mg/dL — ABNORMAL HIGH (ref 0.44–1.00)
GFR, Estimated: 16 mL/min — ABNORMAL LOW (ref >60)
GFR, Estimated: 14 mL/min — ABNORMAL LOW (ref >60)
GFR, Estimated: 14 mL/min — ABNORMAL LOW (ref >60)
GFR, Estimated: 14 mL/min — ABNORMAL LOW (ref >60)
Glucose, Bld: 116 mg/dL — ABNORMAL HIGH (ref 70–99)
Glucose, Bld: 111 mg/dL — ABNORMAL HIGH (ref 70–99)
Glucose, Bld: 126 mg/dL — ABNORMAL HIGH (ref 70–99)
Glucose, Bld: 108 mg/dL — ABNORMAL HIGH (ref 70–99)
Potassium: 3.9 mmol/L (ref 3.5–5.1)
Potassium: 4 mmol/L (ref 3.5–5.1)
Potassium: 4.4 mmol/L (ref 3.5–5.1)
Potassium: 4.3 mmol/L (ref 3.5–5.1)
Sodium: 130 mmol/L — ABNORMAL LOW (ref 135–145)
Sodium: 128 mmol/L — ABNORMAL LOW (ref 135–145)
Sodium: 125 mmol/L — ABNORMAL LOW (ref 135–145)
Sodium: 126 mmol/L — ABNORMAL LOW (ref 135–145)

## 2022-05-31 LAB — CBC
HCT: 27.3 % — ABNORMAL LOW (ref 36.0–46.0)
Hemoglobin: 8.9 g/dL — ABNORMAL LOW (ref 12.0–15.0)
MCH: 31 pg (ref 26.0–34.0)
MCHC: 32.6 g/dL (ref 30.0–36.0)
MCV: 95.1 fL (ref 80.0–100.0)
Platelets: 339 10*3/uL (ref 150–400)
RBC: 2.87 MIL/uL — ABNORMAL LOW (ref 3.87–5.11)
RDW: 13.9 % (ref 11.5–15.5)
WBC: 7.7 10*3/uL (ref 4.0–10.5)
nRBC: 0 % (ref 0.0–0.2)

## 2022-05-31 LAB — SODIUM, URINE, RANDOM: Sodium, Ur: <10 mmol/L

## 2022-05-31 LAB — VANCOMYCIN, RANDOM: Vancomycin Rm: 24 ug/mL

## 2022-05-31 LAB — SURGICAL PATHOLOGY

## 2022-05-31 MED ORDER — ENOXAPARIN SODIUM 30 MG/0.3ML IJ SOSY
30.0000 mg | PREFILLED_SYRINGE | INTRAMUSCULAR | Status: DC
  Administered 2022-05-31 – 2022-06-01 (×2): 30 mg via SUBCUTANEOUS
  Filled 2022-05-31 (×2): qty 0.3

## 2022-05-31 MED ORDER — SODIUM CHLORIDE 0.9 % IV BOLUS
1000.0000 mL | Freq: Once | INTRAVENOUS | Status: AC
  Administered 2022-05-31: 1000 mL via INTRAVENOUS

## 2022-05-31 MED ORDER — PREGABALIN 50 MG PO CAPS
50.0000 mg | ORAL_CAPSULE | Freq: Three times a day (TID) | ORAL | Status: DC
  Administered 2022-05-31 – 2022-06-05 (×15): 50 mg via ORAL
  Filled 2022-05-31 (×15): qty 1

## 2022-05-31 MED ORDER — SODIUM CHLORIDE 0.9 % IV SOLN: INTRAVENOUS | Status: DC

## 2022-05-31 NOTE — Progress Notes (Addendum)
  Spoke to the patient on the phone and chart reviewed Date of Admission:  05/21/2022     ID: [name] [name] is a 57 y.o. female  Principal Problem:   Cellulitis of right foot Active Problems:   GERD (gastroesophageal reflux disease)   Essential hypertension   AKI (acute kidney injury) ([place])   Severe sepsis ([place])   AMS (altered mental status)   Septic shock ([place])   Abscess of right foot   Morbid obesity ([place])   Right foot infection  Pt with multiple fusion surgeries to the spine, rt reverse shoulder arthroplasty, rt TMA was admitted for altered mental status and infection at the TMA site- CT scan did no show any osteo underwent I/D on 05/22/22 MRSA in culture ( previously it had been MSSA)    Subjective: Had revision TMA last week Pt says she is feeling fine She participated in PT yesterday No nausea or vomiting or diarrhea On questioning urine output she says it is less   Medications:   aspirin  81 mg Oral BID   busPIRone  15 mg Oral TID   Chlorhexidine Gluconate Cloth  6 each Topical Q0600   DULoxetine  20 mg Oral Daily   enoxaparin (LOVENOX) injection  0.5 mg/kg Subcutaneous Q24H   escitalopram  10 mg Oral Daily   estradiol  1 mg Oral QHS   insulin aspart  0-6 Units Subcutaneous TID WC   lamoTRIgine  100 mg Oral Daily   naloxegol oxalate  25 mg Oral Daily   pregabalin  100 mg Oral TID   sodium chloride flush  3 mL Intravenous Q12H   topiramate  50 mg Oral BID   vancomycin variable dose per unstable renal function (pharmacist dosing)   Does not apply See admin instructions    Objective: Vital signs in last 24 hours: Patient Vitals for the past 24 hrs:  BP Temp Pulse Resp SpO2  05/31/22 1049 -- -- -- 16 --  05/31/22 0821 129/78 98 F (36.7 C) 72 16 98 %  05/30/22 1540 (!) 142/80 98.5 F (36.9 C) 75 18 100 %       PHYSICAL EXAM: Not seen patient Picture reviewed   Lab Results Recent Labs    05/30/22 0321 05/31/22 0420  WBC 8.9 7.7  HGB 9.3* 8.9*   HCT 28.2* 27.3*  NA 132* 130*  K 3.6 3.9  CL 104 101  CO2 20* 20*  BUN 29* 48*  CREATININE 1.97* 3.22*    Microbiology: MRSA from 05/22/22 culture 05/27/22 surgical culture NG Studies/Results: Xray rt foot- no osteo  Pathology from 05/27/22 of the bone no acute osteo   Assessment/Plan: Rt TMA site infection- underwent I/D and has MRSA infection- was on vanco Went back for revision TMA on 05/27/22 culture NG and pathology no acute osteo As patient having AKI , will DC vanco As no osteo - she will not need IV antibiotic Doxy PO is oral option- but syncope /coma listed for tetracycline Bactrim cannot be used because of AKI Cipro rash Linezolid cannot be used because of multiple SSRI So possibly clinda  As patient is still therapeutic today for vancomycin can hold off  antibiotic today-     AKI- could be combination of incomplete emptying ( bladder scan 600cc) vanco/contrast-  started IV fluids  H/o multiple surgeries to spine  Depression/anxiety on meds  Discussed the management with the patient, her nurse and care team

## 2022-05-31 NOTE — Consult Note (Signed)
Pharmacy Antibiotic Note  [name] [name] is a 57 y.o. female admitted on 05/21/2022 with AMS and right foot stump pain and redness. 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 Taking PO cephalexin PTA and recent Rx for PO bactrim 05/20/22 (unsure if started). Pharmacy has been consulted for vancomycin dosing.   - vancomycin stopped 2/5 as previous culture with MSSA, current culture with MRSA so resume vancomycin. Last dose was 2/5 at 0400 - Vancomycin stopped 2/11 am with last dose being given 2/10 at 21:21 for AKI notes 2/11 am (SCr 0.69 to 1.97)  Today, 05/31/2022 Day #10 antibiotics Renal: NEW AKI - SCr increased to 3.22 (no recent nephrotoxins noted other than vancomycin, IV contrast 2/3 with CT of R foot) WBC improved to WNL Afebrile OR 2/3 for I&D and 2/5 repeat I&D (wound remains open) Does not appear to be bone involvement per podiatry 2/3 OR culture: MRSA - susc to tetracycline (allergy listed) and TMP/SMZ.  Final culture pending Returned to OR 2/8 revision of TMA and wound closure Cultures and pathology sent and pending  Vancomycin levels: Vancomycin 1577m IV q24h (dose 2/8 at a10:04), peak = 27 mcg/ml at 12:29 2/8 and random vancomycin level = 7 mcg/ml at 05:02 2/9 AUC = 352.8 w/ Cmax = 30.2 mcg/ml and Cmin = 5.4 mcg/mL 2/11 Random vancomycin level at 08:58 = 34 (last dose 2/10 at21:21) 2/12 random vancomycin level at 04:20 = 24 (last dose 2/10 at 21:21)  Plan: Holding vancomycin for AKI.  Vancomycin dose was increased to 1gm IV q12h (from 15040mIV q24h) starting 2/9 am following subtherapeutic vancomycin levels?AUC on 2/8 (AUC 353 and Trough = 5.54 mcg/mL) Scr increasing Re-check Scr tomorrow AM along with random vancomycin level Follow wound cultures and pathology   Height: 5' 4"$  (162.6 cm) Weight: 104.3 kg (230 lb) IBW/kg (Calculated) : 54.7  Temp (24hrs), Avg:98.3 F (36.8 C), Min:98 F (36.7 C), Max:98.5 F (36.9 C)  Recent  Labs  Lab 05/26/22 0147 05/27/22 0144 05/27/22 1329 05/28/22 0502 05/28/22 0502 05/30/22 0321 05/30/22 0858 05/31/22 0420  WBC 10.5 12.1*  --  9.0  --  8.9  --  7.7  CREATININE 0.55 0.51  --  0.69  --  1.97*  --  3.22*  VANCOPEAK  --   --  27*  --   --   --   --   --   VANCORANDOM  --   --   --  8   < >  --  34 24   < > = values in this interval not displayed.     Estimated Creatinine Clearance: 22.4 mL/min (A) (by C-G formula based on SCr of 3.22 mg/dL (H)).    Allergies  Allergen Reactions   Other    Tetracyclines & Related Other (See Comments)    Syncope and put her "in a coma"   Tramadol Other (See Comments)    Seizures   Phenazopyridine Other (See Comments)    Unknown   Tetracycline Other (See Comments)    Syncope and "Put me in a coma"   Ciprofloxacin Rash and Itching   Codeine Itching and Rash   Estradiol Rash    Patches broke out the skin    Antimicrobials this admission: 2/5 Unasyn >> 2/7 2/3 Vancomycin >>  2/3 Meropenem >>2/5 2/3 Ceftriaxone x 1 dose   Dose adjustments this admission:   Microbiology results: 2/2 BCx: NGTD 2/3: R foot abscess cx: mod MRSA 2/3 MRSA PCR  neg 2/8 Foot amputation: NGTD  Thank you for allowing pharmacy to be a part of this patient's care.  Doreene Eland, PharmD, BCPS, BCIDP Work Cell: [phone] 05/31/2022 11:24 AM

## 2022-05-31 NOTE — Progress Notes (Signed)
  Subjective:  Patient ID: [name] [name], female    DOB: [birth year],  MRN: [id number]  Seen at bedside POD #4 right foot transmetatarsal amputation.  Not having any pain  Negative for chest pain and shortness of breath Fever: no Night sweats: no Constitutional signs: no Review of all other systems is negative Objective:   Vitals:   05/31/22 0821 05/31/22 1049  BP: 129/78   Pulse: 72   Resp: 16 16  Temp: 98 F (36.7 C)   SpO2: 98%    General AA&O x3. Normal mood and affect.  Vascular Dorsalis pedis and posterior tibial pulses 2/4 bilat.    Neurologic Epicritic sensation grossly absent.  Dermatologic No active bleeding.  Maceration improved.  Some erythema still adjacent to incision but appears to be reactive instead of infectious  Orthopedic: MMT 5/5 in dorsiflexion, plantarflexion, inversion, and eversion. Normal joint ROM without pain or crepitus.   Leukocytosis normalized  Cultures with MRSA  TMA path no margin involvement  Assessment & Plan:  Patient was evaluated and treated and all questions answered.  Right foot infection status post TMA with MRSA -No further operative plans -Nonweightbearing right lower extremity -Infectious diseases following.  They are closely monitoring kidney function.  Okay with 7 to 10 days p.o. antibiotics, doxycycline -No further dressing changes necessary. -Follow-up in 1 to 2 weeks with Dr. Amalia Hailey -We will sign off at this point.  Please call if there are further questions  [name], DPM  Accessible via secure chat for questions or concerns.

## 2022-05-31 NOTE — Progress Notes (Addendum)
Physical Therapy Treatment Patient Details Name: [name] [name] MRN: [id number] DOB: 1966/03/31 Today's Date: 05/31/2022   History of Present Illness 57 year old female with history of bipolar disorder, total arthroplasty right shoulder April 14, 2022, chronic pain syndrome, anxiety depression, recent right toe amputation who presented to emergency room with altered mental status and redness of the right foot amputation.    PT Comments    Ready to get up as lunch just arrives.  Bed mobility with out assist.  Stands and transfers to recliner at bedside with min a x 1 for safety.  Cues for NWB.  Resent TSA prevents progression of gait due to discomfort with NWB.  Should limit to transfers only at this time. Pt stated she has all equipment at home including a wheelchair and scooter.  Stated she feels comfortable with her mom assisting.  Has no stairs to enter home per pt.     Recommendations for follow up therapy are one component of a multi-disciplinary discharge planning process, led by the attending physician.  Recommendations may be updated based on patient status, additional functional criteria and insurance authorization.  Follow Up Recommendations  Follow physician's recommendations for discharge plan and follow up therapies     Assistance Recommended at Discharge Intermittent Supervision/Assistance  Patient can return home with the following Assistance with cooking/housework;Assist for transportation;Help with stairs or ramp for entrance;A little help with walking and/or transfers;A little help with bathing/dressing/bathroom   Equipment Recommendations   (pt reports having all needed equipemnt at home including scooter.)    Recommendations for Other Services       Precautions / Restrictions Precautions Precautions: Fall Restrictions Weight Bearing Restrictions: Yes RUE Weight Bearing: Weight bearing as tolerated RLE Weight Bearing: Non weight bearing     Mobility  Bed  Mobility Overal bed mobility: Modified Independent Bed Mobility: Supine to Sit     Supine to sit: Supervision          Transfers Overall transfer level: Needs assistance Equipment used: Rolling walker (2 wheels) Transfers: Sit to/from Stand Sit to Stand: Min assist                Ambulation/Gait Ambulation/Gait assistance: Herbalist (Feet): 3 Feet Assistive device: Rolling walker (2 wheels) Gait Pattern/deviations: Step-to pattern Gait velocity: dec     General Gait Details: is able to recall NWB status but does seem to do some TDWB so limited to transfer to chair only   Stairs             Wheelchair Mobility    Modified Rankin (Stroke Patients Only)       Balance Overall balance assessment: Needs assistance Sitting-balance support: No upper extremity supported Sitting balance-Leahy Scale: Good     Standing balance support: Bilateral upper extremity supported Standing balance-Leahy Scale: Fair                              Cognition Arousal/Alertness: Awake/alert Behavior During Therapy: WFL for tasks assessed/performed Overall Cognitive Status: Within Functional Limits for tasks assessed                                          Exercises      General Comments        Pertinent Vitals/Pain Pain Assessment Pain Assessment: Faces Faces Pain Scale: Hurts a little  bit Pain Location: R shoulder Pain Descriptors / Indicators: Aching, Sore Pain Intervention(s): Limited activity within patient's tolerance, Monitored during session, Repositioned    Home Living                          Prior Function            PT Goals (current goals can now be found in the care plan section) Progress towards PT goals: Progressing toward goals    Frequency    7X/week      PT Plan Current plan remains appropriate    Co-evaluation              AM-PAC PT "6 Clicks" Mobility   Outcome  Measure  Help needed turning from your back to your side while in a flat bed without using bedrails?: None Help needed moving from lying on your back to sitting on the side of a flat bed without using bedrails?: None Help needed moving to and from a bed to a chair (including a wheelchair)?: A Little Help needed standing up from a chair using your arms (e.g., wheelchair or bedside chair)?: A Little Help needed to walk in hospital room?: A Little Help needed climbing 3-5 steps with a railing? : Total 6 Click Score: 18    End of Session Equipment Utilized During Treatment: Gait belt Activity Tolerance: Patient tolerated treatment well Patient left: with call bell/phone within reach;with chair alarm set;in chair Nurse Communication: Mobility status PT Visit Diagnosis: Muscle weakness (generalized) (M62.81);Difficulty in walking, not elsewhere classified (R26.2)     Time: [id number] PT Time Calculation (min) (ACUTE ONLY): 9 min  Charges:  $Therapeutic Activity: 8-22 mins                   Chesley Noon, PTA 05/31/22, 12:56 PM

## 2022-05-31 NOTE — Progress Notes (Addendum)
PROGRESS NOTE    [name] [name]   [id number] DOB: [birth year]  DOA: 05/21/2022 Date of Service: 05/31/22 PCP: [name], MD     Brief Narrative / Hospital Course:  [name] [name] is 57 year old female with history of bipolar disorder, total arthroplasty right shoulder December 2023, chronic pain syndrome, anxiety depression, who presented to emergency room 05/21/22 with altered mental status and redness of the right foot amputation stump, was on Keflex at home, TMA 2 mos prior.  02/02: to ED as above. Septic shock, required brief levo gtt. 02/03: admitted to hospitalist service, underwent I&D w/ podiatry 02/04: stable on IV abx awaiting Cx 02/05: back to OR for revision I&D.   02/06-02/07: stable, planning back to OR 02/08. Cultures from 02/03 (+)MRSA 02/08: to OR for revision of RLE TMA. Per ID, continue vanco and send bone for pathology and culture  02/09: podiatry and ID recs for continue inpatient for now to ensure surgical stabilization and await culture results to guide abx, will likely be here thru the weekend (today is Friday)   02/10-02/11: stable, awaiting cultures, podiatry examined this AM see photos, bandages in place per their orders to change dressing at follow-up - advising keep thru weekend possible d/c Mon 02/12 pending surgical pathology results  02/12: Cr jumped up today, pt reports no urine yesterday. Instructed RN to cath if needed for urine specimen, will administer IV fluids. ID and pharmacy following and adjusting abx and other possible nephrotoxic medications. Closely monitor Cr and urine/FeNa, low threshold for nephrology consult. --> FeNa prerenal, 600 mL UOP today, Cr worsening this afternoon, will give additional 1L fluids, nephrology consulted.    Consultants:  Podiatry  Infectious Disease   Procedures: 05/22/22: I&D RLE TMA stump 05/24/22: I&D w/ revision/washout w/ primary delayed closure RLE TMA stump 05/27/22 RLE repeat washout, revisional  transmetatarsal amputation and resection of necrotic bone and soft tissue and primary closure of the amputation site per podiatry      ASSESSMENT & PLAN:   Principal Problem:   Cellulitis of right foot Active Problems:   Severe sepsis ([name])   GERD (gastroesophageal reflux disease)   Essential hypertension   AMS (altered mental status)   Septic shock ([name])   Abscess of right foot  Septic shock - POA - resolved  Right foot cellulitis and abscess at site of prior TMA with history of prior ulceration (+)MRSA S/p return to the OR on 05/27/2022 for repeat washout, revisional amputation and resection of necrotic bone and soft tissue and primary closure of the amputation site.  cont IV vanc - cultures NG x2d Surgical path pending  Podiatry ok for po abx (doxycycline), podiatry team has signed off today  ID following, doxy po tomorrow, does not need IV abx since no osteomyelitis   AKI Sharp increase in Cr normal po intake per RN and patient Decreased UOP per RN and patient  Prerenal FeNa IV fluids  Follow BMP closely nephrology consult   Hyponatremia Follow BMP closely IV fluids as above   Depression anxiety bipolar disorder history of seizure disorder cont Buspar, Cymbalta and Lexapro  cont lamotrigine and Topamax   Chronic pain syndrome recent right shoulder arthroplasty cont home Percocet and Lyrica   Morbid obesity, BMI 40 Complicates treatment and prognosis  Sleep apnea CPAP nightly     DVT prophylaxis: lovenox   Pertinent IV fluids/nutrition: no continuous IV fluids  Central lines / invasive devices: none  Code Status: FULL CODE  Current Admission Status: inpatient  TOC needs / Dispo plan: none at this time Barriers to discharge / significant pending items: AKI             Subjective / Brief ROS:  Patient reports no concerns  Denies CP/SOB. Pain controlled. Denies new weakness. When asked about urination - states didn't urinate today, no  urge to urinate, doesn't think she urinated all day yesterday.  States she is eating/drinking normally  Family Communication: none at this time     Objective Findings:  Vitals:   05/31/22 1049 05/31/22 1134 05/31/22 1536 05/31/22 1612  BP:   134/82   Pulse:   71   Resp: 16 18 16 16  $ Temp:   97.8 F (36.6 C)   TempSrc:      SpO2:   100%   Weight:      Height:        Intake/Output Summary (Last 24 hours) at 05/31/2022 1655 Last data filed at 05/31/2022 1642 Gross per 24 hour  Intake 817.83 ml  Output 600 ml  Net 217.83 ml   Filed Weights   05/22/22 1221 05/24/22 1637 05/27/22 1457  Weight: 108.1 kg 108 kg 104.3 kg    Examination:  Physical Exam Constitutional:      Appearance: She is obese.  Cardiovascular:     Rate and Rhythm: Normal rate and regular rhythm.     Heart sounds: Normal heart sounds.  Pulmonary:     Effort: Pulmonary effort is normal.     Breath sounds: Normal breath sounds.  Musculoskeletal:     Right lower leg: No edema (R foot bandage - clean, was not removed).     Left lower leg: No edema.  Neurological:     Mental Status: She is alert.          Scheduled Medications:   aspirin  81 mg Oral BID   busPIRone  15 mg Oral TID   Chlorhexidine Gluconate Cloth  6 each Topical Q0600   DULoxetine  20 mg Oral Daily   enoxaparin (LOVENOX) injection  30 mg Subcutaneous Q24H   escitalopram  10 mg Oral Daily   estradiol  1 mg Oral QHS   insulin aspart  0-6 Units Subcutaneous TID WC   lamoTRIgine  100 mg Oral Daily   naloxegol oxalate  25 mg Oral Daily   pregabalin  50 mg Oral TID   sodium chloride flush  3 mL Intravenous Q12H   topiramate  50 mg Oral BID   vancomycin variable dose per unstable renal function (pharmacist dosing)   Does not apply See admin instructions    Continuous Infusions:  sodium chloride Stopped (05/24/22 1920)   sodium chloride 100 mL/hr at 05/31/22 1642   sodium chloride      PRN Medications:  acetaminophen **OR**  acetaminophen, albuterol, ondansetron (ZOFRAN) IV, mouth rinse, oxyCODONE-acetaminophen  Antimicrobials from admission:  Anti-infectives (From admission, onward)    Start     Dose/Rate Route Frequency Ordered Stop   05/30/22 0734  vancomycin variable dose per unstable renal function (pharmacist dosing)         Does not apply See admin instructions 05/30/22 0734     05/28/22 1000  vancomycin (VANCOCIN) IVPB 1000 mg/200 mL premix  Status:  Discontinued        1,000 mg 200 mL/hr over 60 Minutes Intravenous Every 12 hours 05/28/22 0820 05/30/22 0734   05/25/22 1000  vancomycin (VANCOREADY) IVPB 1500 mg/300 mL  Status:  Discontinued  1,500 mg 150 mL/hr over 120 Minutes Intravenous Every 24 hours 05/25/22 0903 05/28/22 0820   05/24/22 1810  vancomycin (VANCOCIN) powder  Status:  Discontinued          As needed 05/24/22 1810 05/24/22 1824   05/24/22 1230  Ampicillin-Sulbactam (UNASYN) 3 g in sodium chloride 0.9 % 100 mL IVPB  Status:  Discontinued        3 g 200 mL/hr over 30 Minutes Intravenous Every 6 hours 05/24/22 1142 05/26/22 1040   05/23/22 0000  cefTRIAXone (ROCEPHIN) 2 g in sodium chloride 0.9 % 100 mL IVPB  Status:  Discontinued        2 g 200 mL/hr over 30 Minutes Intravenous Every 24 hours 05/22/22 0127 05/22/22 1139   05/22/22 1400  clindamycin (CLEOCIN) IVPB 600 mg  Status:  Discontinued        600 mg 100 mL/hr over 30 Minutes Intravenous Every 8 hours 05/22/22 1142 05/23/22 1311   05/22/22 1300  meropenem (MERREM) 1 g in sodium chloride 0.9 % 100 mL IVPB  Status:  Discontinued        1 g 200 mL/hr over 30 Minutes Intravenous Every 8 hours 05/22/22 1219 05/24/22 1142   05/22/22 0300  vancomycin (VANCOREADY) IVPB 1500 mg/300 mL  Status:  Discontinued        1,500 mg 150 mL/hr over 120 Minutes Intravenous Every 24 hours 05/22/22 0209 05/24/22 1235   05/22/22 0000  cefTRIAXone (ROCEPHIN) 2 g in sodium chloride 0.9 % 100 mL IVPB        2 g 200 mL/hr over 30 Minutes  Intravenous STAT 05/21/22 2351 05/22/22 0110   05/21/22 2345  vancomycin (VANCOCIN) IVPB 1000 mg/200 mL premix        1,000 mg 200 mL/hr over 60 Minutes Intravenous  Once 05/21/22 2340 05/22/22 0110   05/21/22 2345  aztreonam (AZACTAM) 2 g in sodium chloride 0.9 % 100 mL IVPB  Status:  Discontinued        2 g 200 mL/hr over 30 Minutes Intravenous  Once 05/21/22 2340 05/21/22 2351           Data Reviewed:  I have personally reviewed the following...  CBC: Recent Labs  Lab 05/26/22 0147 05/27/22 0144 05/28/22 0502 05/30/22 0321 05/31/22 0420  WBC 10.5 12.1* 9.0 8.9 7.7  HGB 10.8* 10.6* 10.1* 9.3* 8.9*  HCT 33.2* 32.1* 31.4* 28.2* 27.3*  MCV 96.5 95.8 96.3 95.9 95.1  PLT 296 309 338 325 [id number]   Basic Metabolic Panel: Recent Labs  Lab 05/25/22 0256 05/26/22 0147 05/27/22 0144 05/28/22 0502 05/30/22 0321 05/31/22 0420 05/31/22 1213  NA 134* 140 136 134* 132* 130* 128*  K 3.7 3.9 3.4* 4.2 3.6 3.9 4.0  CL 103 105 106 105 104 101 99  CO2 26 22 23 24 $ 20* 20* 19*  GLUCOSE 88 96 107* 113* 111* 116* 111*  BUN 12 10 9 13 $ 29* 48* 53*  CREATININE 0.69 0.55 0.51 0.69 1.97* 3.22* 3.56*  CALCIUM 8.3* 8.5* 8.6* 8.5* 8.4* 8.6* 8.8*  MG 2.0 2.2 2.3  --   --   --   --    GFR: Estimated Creatinine Clearance: 20.3 mL/min (A) (by C-G formula based on SCr of 3.56 mg/dL (H)). Liver Function Tests: No results for input(s): "AST", "ALT", "ALKPHOS", "BILITOT", "PROT", "ALBUMIN" in the last 168 hours.  No results for input(s): "LIPASE", "AMYLASE" in the last 168 hours.  No results for input(s): "AMMONIA" in the last 168 hours. Coagulation  Profile: No results for input(s): "INR", "PROTIME" in the last 168 hours.  Cardiac Enzymes: No results for input(s): "CKTOTAL", "CKMB", "CKMBINDEX", "TROPONINI" in the last 168 hours. BNP (last 3 results) No results for input(s): "PROBNP" in the last 8760 hours. HbA1C: No results for input(s): "HGBA1C" in the last 72 hours. CBG: Recent Labs   Lab 05/30/22 1657 05/30/22 2212 05/31/22 0734 05/31/22 1229 05/31/22 1645  GLUCAP 116* 116* 113* 106* 92   Lipid Profile: No results for input(s): "CHOL", "HDL", "LDLCALC", "TRIG", "CHOLHDL", "LDLDIRECT" in the last 72 hours. Thyroid Function Tests: No results for input(s): "TSH", "T4TOTAL", "FREET4", "T3FREE", "THYROIDAB" in the last 72 hours. Anemia Panel: No results for input(s): "VITAMINB12", "FOLATE", "FERRITIN", "TIBC", "IRON", "RETICCTPCT" in the last 72 hours. Most Recent Urinalysis On File:     Component Value Date/Time   COLORURINE AMBER (A) 05/31/2022 0829   APPEARANCEUR CLOUDY (A) 05/31/2022 0829   LABSPEC 1.017 05/31/2022 0829   PHURINE 5.0 05/31/2022 0829   GLUCOSEU NEGATIVE 05/31/2022 0829   HGBUR MODERATE (A) 05/31/2022 0829   BILIRUBINUR NEGATIVE 05/31/2022 0829   KETONESUR NEGATIVE 05/31/2022 0829   PROTEINUR 100 (A) 05/31/2022 0829   UROBILINOGEN 0.2 06/23/2017 1108   NITRITE NEGATIVE 05/31/2022 0829   LEUKOCYTESUR NEGATIVE 05/31/2022 0829   Sepsis Labs: @LABRCNTIP$ (procalcitonin:4,lacticidven:4) Microbiology: Recent Results (from the past 240 hour(s))  Resp panel by RT-PCR (RSV, Flu A&B, Covid) Anterior Nasal Swab     Status: None   Collection Time: 05/21/22 11:39 PM   Specimen: Anterior Nasal Swab  Result Value Ref Range Status   SARS Coronavirus 2 by RT PCR NEGATIVE NEGATIVE Final    Comment: (NOTE) SARS-CoV-2 target nucleic acids are NOT DETECTED.  The SARS-CoV-2 RNA is generally detectable in upper respiratory specimens during the acute phase of infection. The lowest concentration of SARS-CoV-2 viral copies this assay can detect is 138 copies/mL. A negative result does not preclude SARS-Cov-2 infection and should not be used as the sole basis for treatment or other patient management decisions. A negative result may occur with  improper specimen collection/handling, submission of specimen other than nasopharyngeal swab, presence of viral  mutation(s) within the areas targeted by this assay, and inadequate number of viral copies(<138 copies/mL). A negative result must be combined with clinical observations, patient history, and epidemiological information. The expected result is Negative.  Fact Sheet for Patients:  EntrepreneurPulse.com.au  Fact Sheet for Healthcare Providers:  IncredibleEmployment.be  This test is no t yet approved or cleared by the [place] FDA and  has been authorized for detection and/or diagnosis of SARS-CoV-2 by FDA under an Emergency Use Authorization (EUA). This EUA will remain  in effect (meaning this test can be used) for the duration of the COVID-19 declaration under Section 564(b)(1) of the Act, 21 U.S.C.section 360bbb-3(b)(1), unless the authorization is terminated  or revoked sooner.       Influenza A by PCR NEGATIVE NEGATIVE Final   Influenza B by PCR NEGATIVE NEGATIVE Final    Comment: (NOTE) The Xpert Xpress SARS-CoV-2/FLU/RSV plus assay is intended as an aid in the diagnosis of influenza from Nasopharyngeal swab specimens and should not be used as a sole basis for treatment. Nasal washings and aspirates are unacceptable for Xpert Xpress SARS-CoV-2/FLU/RSV testing.  Fact Sheet for Patients: EntrepreneurPulse.com.au  Fact Sheet for Healthcare Providers: IncredibleEmployment.be  This test is not yet approved or cleared by the [place] FDA and has been authorized for detection and/or diagnosis of SARS-CoV-2 by FDA under an Emergency Use Authorization (  EUA). This EUA will remain in effect (meaning this test can be used) for the duration of the COVID-19 declaration under Section 564(b)(1) of the Act, 21 U.S.C. section 360bbb-3(b)(1), unless the authorization is terminated or revoked.     Resp Syncytial Virus by PCR NEGATIVE NEGATIVE Final    Comment: (NOTE) Fact Sheet for  Patients: EntrepreneurPulse.com.au  Fact Sheet for Healthcare Providers: IncredibleEmployment.be  This test is not yet approved or cleared by the [place] FDA and has been authorized for detection and/or diagnosis of SARS-CoV-2 by FDA under an Emergency Use Authorization (EUA). This EUA will remain in effect (meaning this test can be used) for the duration of the COVID-19 declaration under Section 564(b)(1) of the Act, 21 U.S.C. section 360bbb-3(b)(1), unless the authorization is terminated or revoked.  Performed at [facility], [address] [name], [place] [address]   Blood Culture (routine x 2)     Status: None   Collection Time: 05/21/22 11:39 PM   Specimen: BLOOD  Result Value Ref Range Status   Specimen Description BLOOD LEFT ANTECUBITAL  Final   Special Requests   Final    BOTTLES DRAWN AEROBIC AND ANAEROBIC Blood Culture adequate volume   Culture   Final    NO GROWTH 5 DAYS Performed at [facility], [address] [place], [place] [address]    Report Status 05/26/2022 FINAL  Final  Blood Culture (routine x 2)     Status: None   Collection Time: 05/21/22 11:44 PM   Specimen: BLOOD  Result Value Ref Range Status   Specimen Description BLOOD RIGHT ANTECUBITAL  Final   Special Requests   Final    BOTTLES DRAWN AEROBIC AND ANAEROBIC Blood Culture results may not be optimal due to an excessive volume of blood received in culture bottles   Culture   Final    NO GROWTH 5 DAYS Performed at [facility], [address] [place], [place] [address]    Report Status 05/27/2022 FINAL  Final  MRSA Next Gen by PCR, Nasal     Status: None   Collection Time: 05/22/22 12:26 PM   Specimen: Nasal Mucosa; Nasal Swab  Result Value Ref Range Status   MRSA by PCR Next Gen NOT DETECTED NOT DETECTED Final    Comment: (NOTE) The GeneXpert MRSA Assay (FDA approved for NASAL specimens only), is one component of a  comprehensive MRSA colonization surveillance program. It is not intended to diagnose MRSA infection nor to guide or monitor treatment for MRSA infections. Test performance is not FDA approved in patients less than 16 years old. Performed at [facility], [address] [place], [place] [address]   Aerobic/Anaerobic Culture w Gram Stain (surgical/deep wound)     Status: None   Collection Time: 05/22/22  4:38 PM   Specimen: Foot, Right; Abscess  Result Value Ref Range Status   Specimen Description   Final    FOOT Performed at [facility], [address], [place], [place] [address]    Special Requests   Final    RT FOOT ABSCESS Performed at [facility], [address] [place], [place] [address]    Gram Stain   Final    RARE WBC PRESENT,BOTH PMN AND MONONUCLEAR RARE GRAM POSITIVE COCCI IN PAIRS IN CLUSTERS    Culture   Final    MODERATE METHICILLIN RESISTANT STAPHYLOCOCCUS AUREUS NO ANAEROBES ISOLATED Performed at [facility], [address], [place], [place] [address]  Report Status 05/27/2022 FINAL  Final   Organism ID, Bacteria METHICILLIN RESISTANT STAPHYLOCOCCUS AUREUS  Final      Susceptibility   Methicillin resistant staphylococcus aureus - MIC*    CIPROFLOXACIN <=0.5 SENSITIVE Sensitive     ERYTHROMYCIN >=8 RESISTANT Resistant     GENTAMICIN <=0.5 SENSITIVE Sensitive     OXACILLIN >=4 RESISTANT Resistant     TETRACYCLINE <=1 SENSITIVE Sensitive     VANCOMYCIN <=0.5 SENSITIVE Sensitive     TRIMETH/SULFA <=10 SENSITIVE Sensitive     CLINDAMYCIN <=0.25 SENSITIVE Sensitive     RIFAMPIN <=0.5 SENSITIVE Sensitive     Inducible Clindamycin NEGATIVE Sensitive     * MODERATE METHICILLIN RESISTANT STAPHYLOCOCCUS AUREUS  Aerobic/Anaerobic Culture w Gram Stain (surgical/deep wound)     Status: None (Preliminary result)   Collection Time: 05/27/22  3:59 PM   Specimen: PATH Other; Tissue  Result Value Ref Range Status   Specimen  Description   Final    WOUND RIGHT FOOT AMPUTATION Performed at [facility], [address] [place], [place] [address]    Special Requests   Final    NONE Performed at [facility], [place], [place] [address]    Gram Stain NO WBC SEEN NO ORGANISMS SEEN   Final   Culture   Final    NO GROWTH 4 DAYS NO ANAEROBES ISOLATED; CULTURE IN PROGRESS FOR 5 DAYS Performed at [facility], [place] [address], [place], [place] [address]    Report Status PENDING  Incomplete      Radiology Studies last 3 days: DG Foot Complete Right  Result Date: 05/27/2022 CLINICAL DATA:  Postoperative check. Revision transmetatarsal amputation with delayed primary closure. EXAM: RIGHT FOOT COMPLETE - 3+ VIEW COMPARISON:  Right foot radiographs 05/22/2022 FINDINGS: There is a revision amputation of the first metatarsal to the proximal metaphysis. There appears to be no significant change in prior amputation to the proximal shafts of the second through fifth metatarsals. There are distal foot surgical skin staples. Expected postoperative subcutaneous air is seen. There is again mild high-grade chronic enthesopathic change at the plantar fascia origin. Mild chronic spurring at the Achilles tendon insertion. No cortical erosion is visualized. IMPRESSION: Status post revision amputation of the first metatarsal to the proximal metaphysis. There is a sharp resection border. Electronically Signed   By: Yvonne Kendall M.D.   On: 05/27/2022 18:38             LOS: 9 days       Emeterio Reeve, DO [facility] 05/31/2022, 4:55 PM    Dictation software may have been used to generate the above note. Typos may occur and escape review in typed/dictated notes. Please contact Dr Sheppard Coil directly for clarity if needed.  Staff may message me via secure chat in [place]  but this may not receive an immediate response,  please page me for urgent matters!  If 7PM-7AM, please  contact night coverage [email or web address]

## 2022-05-31 NOTE — Progress Notes (Signed)
PHARMACIST - PHYSICIAN COMMUNICATION  CONCERNING:  Enoxaparin (Lovenox) for DVT Prophylaxis    RECOMMENDATION: Patient was prescribed enoxaprin 60m q24 hours for VTE prophylaxis.   Filed Weights   05/22/22 1221 05/24/22 1637 05/27/22 1457  Weight: 108.1 kg (238 lb 5.1 oz) 108 kg (238 lb 1.6 oz) 104.3 kg (230 lb)    Body mass index is 39.48 kg/m.  Estimated Creatinine Clearance: 20.3 mL/min (A) (by C-G formula based on SCr of 3.56 mg/dL (H)).   Patient is candidate for enoxaparin 329mevery 24 hours based on CrCl <3051min or Weight <45kg  DESCRIPTION: [facility]  Patient is now receiving enoxaparin 30 mg every 24 hours    JusAlison MurrayharmD Clinical Pharmacist  05/31/2022 3:54 PM

## 2022-05-31 NOTE — TOC Progression Note (Signed)
Transition of Care [facility]) - Progression Note    Patient Details  Name: [name] [name] MRN: [id number] Date of Birth: November 08, 1965  Transition of Care [facility]) CM/SW [place], LCSW Phone Number: 05/31/2022, 1:25 PM  Clinical Narrative:  Patient confirmed she has a RW, wheelchair, and 3-in-1 at home.   Expected Discharge Plan: [place] Barriers to Discharge: Continued Medical Work up  Expected Discharge Plan and Services     Post Acute Care Choice: [place] arrangements for the past 2 months: Single Family Home                                       Social Determinants of Health (SDOH) Interventions SDOH Screenings   Food Insecurity: No Food Insecurity (05/22/2022)  Housing: Low Risk  (05/22/2022)  Transportation Needs: No Transportation Needs (05/22/2022)  Utilities: Not At Risk (05/22/2022)  Alcohol Screen: Low Risk  (08/21/2020)  Depression (PHQ2-9): Low Risk  (09/18/2021)  Financial Resource Strain: Low Risk  (08/21/2020)  Physical Activity: Inactive (08/21/2020)  Social Connections: Socially Isolated (08/21/2020)  Stress: No Stress Concern Present (08/21/2020)  Tobacco Use: Low Risk  (05/28/2022)    Readmission Risk Interventions     No data to display

## 2022-06-01 ENCOUNTER — Other Ambulatory Visit (HOSPITAL_COMMUNITY): Payer: Self-pay

## 2022-06-01 DIAGNOSIS — L089 Local infection of the skin and subcutaneous tissue, unspecified: Secondary | ICD-10-CM | POA: Diagnosis not present

## 2022-06-01 LAB — BASIC METABOLIC PANEL
Anion gap: 8 (ref 5–15)
BUN: 61 mg/dL — ABNORMAL HIGH (ref 6–20)
CO2: 17 mmol/L — ABNORMAL LOW (ref 22–32)
Calcium: 7.9 mg/dL — ABNORMAL LOW (ref 8.9–10.3)
Chloride: 99 mmol/L (ref 98–111)
Creatinine, Ser: 3.39 mg/dL — ABNORMAL HIGH (ref 0.44–1.00)
GFR, Estimated: 15 mL/min — ABNORMAL LOW (ref >60)
Glucose, Bld: 108 mg/dL — ABNORMAL HIGH (ref 70–99)
Potassium: 4.5 mmol/L (ref 3.5–5.1)
Sodium: 124 mmol/L — ABNORMAL LOW (ref 135–145)

## 2022-06-01 LAB — AEROBIC/ANAEROBIC CULTURE W GRAM STAIN (SURGICAL/DEEP WOUND)
Culture: NO GROWTH
Gram Stain: NONE SEEN

## 2022-06-01 LAB — GLUCOSE, CAPILLARY
Glucose-Capillary: 94 mg/dL (ref 70–99)
Glucose-Capillary: 91 mg/dL (ref 70–99)
Glucose-Capillary: 105 mg/dL — ABNORMAL HIGH (ref 70–99)
Glucose-Capillary: 87 mg/dL (ref 70–99)

## 2022-06-01 LAB — CK: Total CK: 12 U/L — ABNORMAL LOW (ref 38–234)

## 2022-06-01 LAB — VANCOMYCIN, RANDOM: Vancomycin Rm: 17 ug/mL

## 2022-06-01 MED ORDER — SODIUM CHLORIDE 0.9 % IV BOLUS
1000.0000 mL | Freq: Once | INTRAVENOUS | Status: AC
  Administered 2022-06-01: 1000 mL via INTRAVENOUS

## 2022-06-01 NOTE — Consult Note (Signed)
Pharmacy Antibiotic Note  [name] [name] is a 57 y.o. female admitted on 05/21/2022 with AMS and right foot stump pain and redness. 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 Taking PO cephalexin PTA and recent Rx for PO bactrim 05/20/22 (unsure if started). Pharmacy has been consulted for vancomycin dosing.   - vancomycin stopped 2/5 as previous culture with MSSA, current culture with MRSA so resume vancomycin. Last dose was 2/5 at 0400 - Vancomycin stopped 2/11 am with last dose being given 2/10 at 21:21 for AKI notes 2/11 am (SCr 0.69 to 1.97)  Today, 06/01/2022 Day #11 antibiotics Renal: AKI - SCr 3.39 (slight improvement) WBC improved to WNL Afebrile OR 2/3 for I&D and 2/5 repeat I&D (wound remains open) Does not appear to be bone involvement per podiatry 2/3 OR culture: MRSA - susc to tetracycline (allergy listed) and TMP/SMZ.  Final culture pending Returned to OR 2/8 revision of TMA and wound closure Cultures and pathology sent and pending  Vancomycin levels: Vancomycin 1551m IV q24h (dose 2/8 at a10:04), peak = 27 mcg/ml at 12:29 2/8 and random vancomycin level = 7 mcg/ml at 05:02 2/9 AUC = 352.8 w/ Cmax = 30.2 mcg/ml and Cmin = 5.4 mcg/mL 2/11 Random vancomycin level at 08:58 = 34 (last dose 2/10 at21:21) 2/12 random vancomycin level at 04:20 = 24 (last dose 2/10 at 21:21) 2/13 random vancomycin level at 05:04 = 17 (last dose 2/10)  Plan: Holding vancomycin for AKI.  Vancomycin dose was increased to 1gm IV q12h (from 15034mIV q24h) starting morning of 2/9 following subtherapeutic vancomycin levels/AUC on 2/8 (AUC 353 and Trough = 5.54 mcg/mL) Scr slight improvement Re-check Scr tomorrow AM along with random vancomycin level Plan as discussed with ID will be to start daptomycin once vancomycin level ~10 mcg/mL (suspect will be <=10 tomorrow morning) Follow wound cultures and pathology   Height: 5' 4"$  (162.6 cm) Weight: 104.3 kg (230  lb) IBW/kg (Calculated) : 54.7  Temp (24hrs), Avg:98 F (36.7 C), Min:97.9 F (36.6 C), Max:98.2 F (36.8 C)  Recent Labs  Lab 05/26/22 0147 05/27/22 0144 05/27/22 1329 05/28/22 0502 05/30/22 0321 05/30/22 0858 05/31/22 0420 05/31/22 1213 05/31/22 1739 05/31/22 2253 06/01/22 0504  WBC 10.5 12.1*  --  9.0 8.9  --  7.7  --   --   --   --   CREATININE 0.55 0.51  --  0.69 1.97*  --  3.22* 3.56* 3.54* 3.55* 3.39*  VANCOPEAK  --   --  27*  --   --   --   --   --   --   --   --   VANCORANDOM  --   --   --  8  --    < > 24  --   --   --  17   < > = values in this interval not displayed.     Estimated Creatinine Clearance: 21.3 mL/min (A) (by C-G formula based on SCr of 3.39 mg/dL (H)).    Allergies  Allergen Reactions   Other    Tetracyclines & Related Other (See Comments)    Syncope and put her "in a coma"   Tramadol Other (See Comments)    Seizures   Phenazopyridine Other (See Comments)    Unknown   Tetracycline Other (See Comments)    Syncope and "Put me in a coma"   Ciprofloxacin Rash and Itching   Codeine Itching and Rash   Estradiol Rash  Patches broke out the skin    Antimicrobials this admission: 2/5 Unasyn >> 2/7 2/3 Vancomycin >>  2/3 Meropenem >>2/5 2/3 Ceftriaxone x 1 dose   Dose adjustments this admission:   Microbiology results: 2/2 BCx: NGTD 2/3: R foot abscess cx: mod MRSA 2/3 MRSA PCR neg 2/8 Foot amputation: NGTD  Thank you for allowing pharmacy to be a part of this patient's care.  Doreene Eland, PharmD, BCPS, BCIDP Work Cell: [phone] 06/01/2022 4:52 PM

## 2022-06-01 NOTE — Progress Notes (Signed)
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    PT Comments    Pt was sitting in recliner upon arrival. Author made several attempts in the morning but pt was either asleep or requesting author return later. She does agree to session and was cooperative however remains somewhat lethargic/flat in presentation. She was able to stand 4 x from recliner throughout session with CGA. Once fatigued did require min assist to safely stand pivot back to EOB from recliner. Pt has limited standing tolerance while maintaining NWB. Pt continues to recommend limited ambulation due to her inability to maintain NWB. Pt has ramp entry at home and is planning to DC at w/c level. Lives with her mother who she states can assist her with her needs. PT will continue to follow per current POC. Recommend continued PT at DC weather at home or at rehab.      Recommendations for follow up therapy are one component of a multi-disciplinary discharge planning process, led by the attending physician.  Recommendations may be updated based on patient status, additional functional criteria and insurance authorization.  Follow Up Recommendations  Follow physician's recommendations for discharge plan and follow up therapies (Pt wanting to return home with her supportive mother. Has all equipment needs met (w/c, RW, BSC, etc.))     Assistance Recommended at Discharge Intermittent Supervision/Assistance  Patient can return home with the following Assistance with cooking/housework;Assist for transportation;Help with stairs or ramp for entrance;A little help  with walking and/or transfers;A little help with bathing/dressing/bathroom   Equipment Recommendations  Wheelchair (measurements PT);Wheelchair cushion (measurements PT);Rolling walker (2 wheels);BSC/3in1 (pt endorses having all equipment but will need to be confirmed with pt's mother)       Precautions / Restrictions Precautions Precautions: Fall Restrictions Weight Bearing Restrictions: Yes RUE Weight Bearing: Weight bearing as tolerated RLE Weight Bearing: Non weight bearing     Mobility  Bed Mobility Overal bed mobility: Modified Independent Bed Mobility: Sit to Supine  Sit to supine: Supervision General bed mobility comments: no physical assistance required however increased time to return to supine from sitting EOB.    Transfers Overall transfer level: Needs assistance Equipment used: Rolling walker (2 wheels) Transfers: Sit to/from Stand Sit to Stand: Min guard    General transfer comment: CGA for safety. stood 4 x from recliner throughout session prior to stand pivot to EOB. min assist for safety to stand pivot to EOB form recliner. pt has poor abilities to maintain NWB RLE with dynamic standing task    Ambulation/Gait Ambulation/Gait assistance: Min assist Gait Distance (Feet): 2 Feet Assistive device: Rolling walker (2 wheels) Gait Pattern/deviations:  (" hop to.") Gait velocity: decreased     General Gait Details: pt was able to hop and clear LLE 2 x but after 2 times fatigues and is unable to clear LLE while maintaining NWB RLE.     Balance Overall balance assessment: Needs assistance Sitting-balance support: No upper extremity supported Sitting balance-Leahy Scale: Good     Standing balance support: Bilateral upper extremity supported Standing balance-Leahy Scale: Fair       Cognition Arousal/Alertness: Awake/alert Behavior During Therapy: WFL for tasks assessed/performed Overall Cognitive Status: Within Functional Limits for  tasks assessed     General Comments: pt is A and O x 4. she is lethargic and has been sleeping alot throughout the day today. author attempted to treat pt 3 x in the AM but pt remains asleep. She endorses wanting to return to bed after finishing her therapy.        Exercises General Exercises - Lower Extremity Ankle Circles/Pumps: AROM, 10 reps Quad Sets: AROM, 10 reps Gluteal Sets: 10 reps Long Arc Quad: AROM (2 x 10) Hip ABduction/ADduction: AROM, 10 reps Straight Leg Raises: AROM, 10 reps        Pertinent Vitals/Pain Pain Assessment Pain Assessment: 0-10 Pain Score: 6  Pain Location: R shoulder Pain Descriptors / Indicators: Aching, Sore Pain Intervention(s): Limited activity within patient's tolerance, Monitored during session, Premedicated before session, Repositioned     PT Goals (current goals can now be found in the care plan section) Acute Rehab PT Goals Patient Stated Goal: get back home Progress towards PT goals: Progressing toward goals    Frequency    7X/week      PT Plan Current plan remains appropriate       AM-PAC PT "6 Clicks" Mobility   Outcome Measure  Help needed turning from your back to your side while in a flat bed without using bedrails?: None Help needed moving from lying on your back to sitting on the side of a flat bed without using bedrails?: None Help needed moving to and from a bed to a chair (including a wheelchair)?: A Little Help needed standing up from a chair using your arms (e.g., wheelchair or bedside chair)?: A Little Help needed to walk in hospital room?: A Little Help needed climbing 3-5 steps with a railing? : Total 6 Click Score: 18    End of Session   Activity Tolerance: Patient tolerated treatment well;Patient limited by fatigue Patient left: in bed;with call bell/phone within reach;with bed alarm set;with nursing/sitter in room Nurse Communication: Mobility status PT Visit Diagnosis: Muscle weakness (generalized) (M62.81);Difficulty  in walking, not elsewhere classified (R26.2)     Time: [id number] PT Time Calculation (min) (ACUTE ONLY): 15 min  Charges:  $Therapeutic Exercise: 8-22 mins                    Julaine Fusi PTA 06/01/22, 3:14 PM

## 2022-06-01 NOTE — Progress Notes (Signed)
Bladder scan done at 0415, 87 ml recorded. The author asked the patient if she feel the urged to urinate but she denies. The [name] also encouraged her to get up and try to use the [facility] but she refused.

## 2022-06-01 NOTE — Progress Notes (Signed)
Patient had no urine output , her last urine was yesterday 1128 am by intermittent cath, bladder scan done at 2130 53m recorded and bladder scan done at 0120, 116 ml recorded. Patient state that she didn't feel her bladder is full and the urgency to urinate.

## 2022-06-01 NOTE — Plan of Care (Signed)
  Problem: Education: Goal: Knowledge of General Education information will improve Description: Including pain rating scale, medication(s)/side effects and non-pharmacologic comfort measures Outcome: Progressing   Problem: Clinical Measurements: Goal: Ability to maintain clinical measurements within normal limits will improve Outcome: Progressing Goal: Diagnostic test results will improve Outcome: Progressing Goal: Cardiovascular complication will be avoided Outcome: Progressing   Problem: Activity: Goal: Risk for activity intolerance will decrease Outcome: Progressing   Problem: Nutrition: Goal: Adequate nutrition will be maintained Outcome: Progressing   Problem: Coping: Goal: Level of anxiety will decrease Outcome: Progressing   Problem: Elimination: Goal: Will not experience complications related to bowel motility Outcome: Progressing   Problem: Pain Managment: Goal: General experience of comfort will improve Outcome: Progressing

## 2022-06-01 NOTE — Progress Notes (Signed)
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  02/02: to ED as above. Septic shock, required brief levo gtt. 02/03: admitted to hospitalist service, underwent I&D w/ podiatry 02/04: stable on IV abx awaiting Cx 02/05: back to OR for revision I&D.   02/06-02/07: stable, planning back to OR 02/08. Cultures from 02/03 (+)MRSA 02/08: to OR for revision of RLE TMA. Per ID, continue vanco and send bone for pathology and culture  02/09: podiatry and ID recs for continue inpatient for now to ensure surgical stabilization and await culture results to guide abx, will likely be here thru the weekend (today is Friday)   02/10-02/11: stable, awaiting cultures 02/12: Surgical pathology resulted: no osteomyelitis. Cr jumped up today, pt reports no urine yesterday. IV fluids. UA no infection, FeNa prerenal. ID and pharmacy following and adjusting abx and other possible nephrotoxic medications. 600 mL UOP today, Cr worsening this afternoon, will give additional 1L fluids, nephrology consulted.  02/13: minimal improvement Cr, worsening hyponatremia, nephrology recs pending.    Consultants:  Podiatry  Infectious Disease  Nephrology   Procedures: 05/22/22: I&D RLE TMA stump 05/24/22: I&D w/ revision/washout w/ primary delayed closure RLE TMA stump 05/27/22 RLE repeat washout, revisional transmetatarsal amputation and resection of necrotic bone and soft tissue and primary closure of the amputation site per podiatry      ASSESSMENT & PLAN:   Principal  Problem:   Cellulitis of right foot Active Problems:   Severe sepsis ([name])   GERD (gastroesophageal reflux disease)   Essential hypertension   AMS (altered mental status)   Septic shock ([name])   Abscess of right foot  Septic shock - POA - resolved  Right foot cellulitis and abscess at site of prior TMA with history of prior ulceration (+)MRSA S/p return to the OR on 05/27/2022 for repeat washout, revisional amputation and resection of necrotic bone and soft tissue and primary closure of the amputation site.  Surgical path no osteomyelitis   Podiatry ok for po abx (doxycycline), podiatry team has signed off 02/12. Follow-up in 1 to 2 weeks with Dr. Amalia Hailey  ID following, does not need IV abx since no osteomyelitis, hx allergy to tetracycline, final abx recs pending    AKI Sharp increase in Cr normal po intake per RN and patient Decreased UOP per RN and patient  Prerenal FeNa IV fluids --> only minimal improvement in Cr  Follow BMP closely nephrology consulted, appreciate recs   Hyponatremia Likely d/t AKI Follow BMP closely IV fluids as above  Nephrology consulted, appreciate recs   Depression anxiety bipolar disorder history of seizure disorder cont Buspar, Cymbalta and Lexapro  cont lamotrigine and Topamax   Chronic pain syndrome recent right shoulder arthroplasty cont home Percocet and Lyrica - dose reduced d/t renal    Morbid obesity, BMI 40 Complicates treatment and prognosis  Sleep apnea CPAP nightly     DVT prophylaxis: lovenox   Pertinent IV fluids/nutrition: IV fluids as above given AKI  Central lines / invasive devices: none  Code Status: FULL CODE  Current Admission Status: inpatient  TOC needs / Dispo plan: none at this time Barriers to discharge / significant pending items: AKI             Subjective / Brief ROS:  Patient reports no concerns  Denies CP/SOB. Pain controlled. Denies new weakness. States she is eating/drinking  normally  Family Communication: none at this time     Objective Findings:  Vitals:   05/31/22 1657 05/31/22 2148 06/01/22 0709 06/01/22 0744  BP:  139/83  128/79  Pulse:  73  65  Resp: 16 17 18 17  $ Temp:  98.2 F (36.8 C)  98 F (36.7 C)  TempSrc:      SpO2:  100%  99%  Weight:      Height:        Intake/Output Summary (Last 24 hours) at 06/01/2022 1306 Last data filed at 06/01/2022 1132 Gross per 24 hour  Intake 2783.13 ml  Output 250 ml  Net 2533.13 ml   Filed Weights   05/22/22 1221 05/24/22 1637 05/27/22 1457  Weight: 108.1 kg 108 kg 104.3 kg    Examination:  Physical Exam Constitutional:      Appearance: She is obese.  Cardiovascular:     Rate and Rhythm: Normal rate and regular rhythm.     Heart sounds: Normal heart sounds.  Pulmonary:     Effort: Pulmonary effort is normal.     Breath sounds: Normal breath sounds.  Musculoskeletal:     Right lower leg: No edema (R foot bandage - clean, was not removed).     Left lower leg: No edema.  Neurological:     Mental Status: She is alert.          Scheduled Medications:   aspirin  81 mg Oral BID   busPIRone  15 mg Oral TID   Chlorhexidine Gluconate Cloth  6 each Topical Q0600   DULoxetine  20 mg Oral Daily   enoxaparin (LOVENOX) injection  30 mg Subcutaneous Q24H   escitalopram  10 mg Oral Daily   estradiol  1 mg Oral QHS   insulin aspart  0-6 Units Subcutaneous TID WC   lamoTRIgine  100 mg Oral Daily   naloxegol oxalate  25 mg Oral Daily   pregabalin  50 mg Oral TID   sodium chloride flush  3 mL Intravenous Q12H   topiramate  50 mg Oral BID   vancomycin variable dose per unstable renal function (pharmacist dosing)   Does not apply See admin instructions    Continuous Infusions:  sodium chloride Stopped (05/24/22 1920)   sodium chloride 100 mL/hr at 06/01/22 0311    PRN Medications:  acetaminophen **OR** acetaminophen, albuterol, ondansetron (ZOFRAN) IV, mouth rinse,  oxyCODONE-acetaminophen  Antimicrobials from admission:  Anti-infectives (From admission, onward)    Start     Dose/Rate Route Frequency Ordered Stop   05/30/22 0734  vancomycin variable dose per unstable renal function (pharmacist dosing)         Does not apply See admin instructions 05/30/22 0734     05/28/22 1000  vancomycin (VANCOCIN) IVPB 1000 mg/200 mL premix  Status:  Discontinued        1,000 mg 200 mL/hr over 60 Minutes Intravenous Every 12 hours 05/28/22 0820 05/30/22 0734   05/25/22 1000  vancomycin (VANCOREADY) IVPB 1500 mg/300 mL  Status:  Discontinued        1,500 mg 150 mL/hr over 120 Minutes Intravenous Every 24 hours 05/25/22 0903 05/28/22 0820   05/24/22 1810  vancomycin (VANCOCIN) powder  Status:  Discontinued          As needed 05/24/22 1810 05/24/22 1824   05/24/22 1230  Ampicillin-Sulbactam (UNASYN) 3 g in sodium chloride 0.9 % 100 mL IVPB  Status:  Discontinued        3 g 200 mL/hr over 30 Minutes Intravenous Every 6 hours 05/24/22 1142 05/26/22 1040   05/23/22 0000  cefTRIAXone (ROCEPHIN) 2 g in sodium chloride 0.9 % 100 mL IVPB  Status:  Discontinued        2 g 200 mL/hr over 30 Minutes Intravenous Every 24 hours 05/22/22 0127 05/22/22 1139   05/22/22 1400  clindamycin (CLEOCIN) IVPB 600 mg  Status:  Discontinued        600 mg 100 mL/hr over 30 Minutes Intravenous Every 8 hours 05/22/22 1142 05/23/22 1311   05/22/22 1300  meropenem (MERREM) 1 g in sodium chloride 0.9 % 100 mL IVPB  Status:  Discontinued        1 g 200 mL/hr over 30 Minutes Intravenous Every 8 hours 05/22/22 1219 05/24/22 1142   05/22/22 0300  vancomycin (VANCOREADY) IVPB 1500 mg/300 mL  Status:  Discontinued        1,500 mg 150 mL/hr over 120 Minutes Intravenous Every 24 hours 05/22/22 0209 05/24/22 1235   05/22/22 0000  cefTRIAXone (ROCEPHIN) 2 g in sodium chloride 0.9 % 100 mL IVPB        2 g 200 mL/hr over 30 Minutes Intravenous STAT 05/21/22 2351 05/22/22 0110   05/21/22 2345  vancomycin  (VANCOCIN) IVPB 1000 mg/200 mL premix        1,000 mg 200 mL/hr over 60 Minutes Intravenous  Once 05/21/22 2340 05/22/22 0110   05/21/22 2345  aztreonam (AZACTAM) 2 g in sodium chloride 0.9 % 100 mL IVPB  Status:  Discontinued        2 g 200 mL/hr over 30 Minutes Intravenous  Once 05/21/22 2340 05/21/22 2351           Data Reviewed:  I have personally reviewed the following...  CBC: Recent Labs  Lab 05/26/22 0147 05/27/22 0144 05/28/22 0502 05/30/22 0321 05/31/22 0420  WBC 10.5 12.1* 9.0 8.9 7.7  HGB 10.8* 10.6* 10.1* 9.3* 8.9*  HCT 33.2* 32.1* 31.4* 28.2* 27.3*  MCV 96.5 95.8 96.3 95.9 95.1  PLT 296 309 338 325 [id number]   Basic Metabolic Panel: Recent Labs  Lab 05/26/22 0147 05/27/22 0144 05/28/22 0502 05/31/22 0420 05/31/22 1213 05/31/22 1739 05/31/22 2253 06/01/22 0504  NA 140 136   < > 130* 128* 125* 126* 124*  K 3.9 3.4*   < > 3.9 4.0 4.4 4.3 4.5  CL 105 106   < > 101 99 97* 98 99  CO2 22 23   < > 20* 19* 19* 19* 17*  GLUCOSE 96 107*   < > 116* 111* 126* 108* 108*  BUN 10 9   < > 48* 53* 57* 58* 61*  CREATININE 0.55 0.51   < > 3.22* 3.56* 3.54* 3.55* 3.39*  CALCIUM 8.5* 8.6*   < > 8.6* 8.8* 8.5* 8.0* 7.9*  MG 2.2 2.3  --   --   --   --   --   --    < > = values in this interval not displayed.   GFR: Estimated Creatinine Clearance: 21.3 mL/min (A) (by C-G formula based on SCr of 3.39 mg/dL (H)). Liver Function Tests: No results for input(s): "AST", "ALT", "ALKPHOS", "BILITOT", "PROT", "ALBUMIN" in the last  168 hours.  No results for input(s): "LIPASE", "AMYLASE" in the last 168 hours.  No results for input(s): "AMMONIA" in the last 168 hours. Coagulation Profile: No results for input(s): "INR", "PROTIME" in the last 168 hours.  Cardiac Enzymes: Recent Labs  Lab 06/01/22 0504  CKTOTAL 12*   BNP (last 3 results) No results for input(s): "PROBNP" in the last 8760 hours. HbA1C: No results for input(s): "HGBA1C" in the last 72 hours. CBG: Recent  Labs  Lab 05/31/22 1229 05/31/22 1645 05/31/22 2045 06/01/22 0753 06/01/22 1216  GLUCAP 106* 92 102* 94 91   Lipid Profile: No results for input(s): "CHOL", "HDL", "LDLCALC", "TRIG", "CHOLHDL", "LDLDIRECT" in the last 72 hours. Thyroid Function Tests: No results for input(s): "TSH", "T4TOTAL", "FREET4", "T3FREE", "THYROIDAB" in the last 72 hours. Anemia Panel: No results for input(s): "VITAMINB12", "FOLATE", "FERRITIN", "TIBC", "IRON", "RETICCTPCT" in the last 72 hours. Most Recent Urinalysis On File:     Component Value Date/Time   COLORURINE AMBER (A) 05/31/2022 0829   APPEARANCEUR CLOUDY (A) 05/31/2022 0829   LABSPEC 1.017 05/31/2022 0829   PHURINE 5.0 05/31/2022 0829   GLUCOSEU NEGATIVE 05/31/2022 0829   HGBUR MODERATE (A) 05/31/2022 0829   BILIRUBINUR NEGATIVE 05/31/2022 0829   KETONESUR NEGATIVE 05/31/2022 0829   PROTEINUR 100 (A) 05/31/2022 0829   UROBILINOGEN 0.2 06/23/2017 1108   NITRITE NEGATIVE 05/31/2022 0829   LEUKOCYTESUR NEGATIVE 05/31/2022 0829   Sepsis Labs: @LABRCNTIP$ (procalcitonin:4,lacticidven:4) Microbiology: Recent Results (from the past 240 hour(s))  Aerobic/Anaerobic Culture w Gram Stain (surgical/deep wound)     Status: None   Collection Time: 05/22/22  4:38 PM   Specimen: Foot, Right; Abscess  Result Value Ref Range Status   Specimen Description   Final    FOOT Performed at [facility], [address], [place], [place] [address]    Special Requests   Final    RT FOOT ABSCESS Performed at [facility], [address] [place], [place] [address]    Gram Stain   Final    RARE WBC PRESENT,BOTH PMN AND MONONUCLEAR RARE GRAM POSITIVE COCCI IN PAIRS IN CLUSTERS    Culture   Final    MODERATE METHICILLIN RESISTANT STAPHYLOCOCCUS AUREUS NO ANAEROBES ISOLATED Performed at [facility], [address] [place], [place] [address]    Report Status 05/27/2022 FINAL  Final   Organism ID, Bacteria METHICILLIN RESISTANT  STAPHYLOCOCCUS AUREUS  Final      Susceptibility   Methicillin resistant staphylococcus aureus - MIC*    CIPROFLOXACIN <=0.5 SENSITIVE Sensitive     ERYTHROMYCIN >=8 RESISTANT Resistant     GENTAMICIN <=0.5 SENSITIVE Sensitive     OXACILLIN >=4 RESISTANT Resistant     TETRACYCLINE <=1 SENSITIVE Sensitive     VANCOMYCIN <=0.5 SENSITIVE Sensitive     TRIMETH/SULFA <=10 SENSITIVE Sensitive     CLINDAMYCIN <=0.25 SENSITIVE Sensitive     RIFAMPIN <=0.5 SENSITIVE Sensitive     Inducible Clindamycin NEGATIVE Sensitive     * MODERATE METHICILLIN RESISTANT STAPHYLOCOCCUS AUREUS  Aerobic/Anaerobic Culture w Gram Stain (surgical/deep wound)     Status: None (Preliminary result)   Collection Time: 05/27/22  3:59 PM   Specimen: PATH Other; Tissue  Result Value Ref Range Status   Specimen Description   Final    WOUND RIGHT FOOT AMPUTATION Performed at [facility], [address] [place], [place] [address]    Special Requests   Final    NONE Performed at [facility], [address] [place], [place] [address]    Gram Stain NO WBC SEEN NO ORGANISMS SEEN   Final   Culture   Final    NO GROWTH 4 DAYS NO ANAEROBES ISOLATED; CULTURE IN PROGRESS FOR 5 DAYS Performed at [facility] [address], [place], [place] [address]    Report Status PENDING  Incomplete      Radiology Studies last 3 days: No results found.           LOS: 10 days       Emeterio Reeve, DO [facility] 06/01/2022, 1:06 PM    Dictation software may have been used to generate the above note. Typos may occur and escape review in typed/dictated notes. Please contact Dr Sheppard Coil directly for clarity if needed.  Staff may message me via secure chat in [place]  but this may not receive an immediate response,  please page me for urgent matters!  If 7PM-7AM, please contact night coverage [email or web address]

## 2022-06-02 ENCOUNTER — Inpatient Hospital Stay: Payer: 59

## 2022-06-02 DIAGNOSIS — L03115 Cellulitis of right lower limb: Secondary | ICD-10-CM | POA: Diagnosis not present

## 2022-06-02 LAB — RENAL FUNCTION PANEL
Albumin: 2.4 g/dL — ABNORMAL LOW (ref 3.5–5.0)
Anion gap: 7 (ref 5–15)
BUN: 70 mg/dL — ABNORMAL HIGH (ref 6–20)
CO2: 17 mmol/L — ABNORMAL LOW (ref 22–32)
Calcium: 8.1 mg/dL — ABNORMAL LOW (ref 8.9–10.3)
Chloride: 104 mmol/L (ref 98–111)
Creatinine, Ser: 2.75 mg/dL — ABNORMAL HIGH (ref 0.44–1.00)
GFR, Estimated: 19 mL/min — ABNORMAL LOW (ref >60)
Glucose, Bld: 99 mg/dL (ref 70–99)
Phosphorus: 9 mg/dL — ABNORMAL HIGH (ref 2.5–4.6)
Potassium: 5.3 mmol/L — ABNORMAL HIGH (ref 3.5–5.1)
Sodium: 128 mmol/L — ABNORMAL LOW (ref 135–145)

## 2022-06-02 LAB — CBC
HCT: 23.9 % — ABNORMAL LOW (ref 36.0–46.0)
Hemoglobin: 7.7 g/dL — ABNORMAL LOW (ref 12.0–15.0)
MCH: 31.2 pg (ref 26.0–34.0)
MCHC: 32.2 g/dL (ref 30.0–36.0)
MCV: 96.8 fL (ref 80.0–100.0)
Platelets: 280 10*3/uL (ref 150–400)
RBC: 2.47 MIL/uL — ABNORMAL LOW (ref 3.87–5.11)
RDW: 14.1 % (ref 11.5–15.5)
WBC: 6.5 10*3/uL (ref 4.0–10.5)
nRBC: 0 % (ref 0.0–0.2)

## 2022-06-02 LAB — IRON AND TIBC
Iron: 50 ug/dL (ref 28–170)
Saturation Ratios: 20 % (ref 10.4–31.8)
TIBC: 253 ug/dL (ref 250–450)
UIBC: 203 ug/dL

## 2022-06-02 LAB — GLUCOSE, CAPILLARY
Glucose-Capillary: 83 mg/dL (ref 70–99)
Glucose-Capillary: 86 mg/dL (ref 70–99)
Glucose-Capillary: 83 mg/dL (ref 70–99)
Glucose-Capillary: 103 mg/dL — ABNORMAL HIGH (ref 70–99)

## 2022-06-02 LAB — VANCOMYCIN, RANDOM: Vancomycin Rm: 12 ug/mL

## 2022-06-02 MED ORDER — SODIUM ZIRCONIUM CYCLOSILICATE 10 G PO PACK
10.0000 g | PACK | Freq: Every day | ORAL | Status: DC
  Administered 2022-06-02: 10 g via ORAL
  Filled 2022-06-02 (×2): qty 1

## 2022-06-02 MED ORDER — SODIUM CHLORIDE 0.9 % IV SOLN: INTRAVENOUS | Status: DC

## 2022-06-02 MED ORDER — SODIUM BICARBONATE 8.4 % IV SOLN
INTRAVENOUS | Status: DC
  Filled 2022-06-02 (×2): qty 1000
  Filled 2022-06-02 (×2): qty 150
  Filled 2022-06-02: qty 1000

## 2022-06-02 MED ORDER — SODIUM CHLORIDE 0.9 % IV SOLN
500.0000 mg | INTRAVENOUS | Status: DC
  Administered 2022-06-02: 500 mg via INTRAVENOUS
  Filled 2022-06-02: qty 10

## 2022-06-02 NOTE — Consult Note (Signed)
[facility]  CONSULT NOTE    Date: 06/02/2022                  Patient Name:  [name] [name]  MRN: [id number]  DOB: 1965-07-02  Age / Sex: 57 y.o., female         PCP: [name], MD                 Service Requesting Consult: [facility]                 Reason for Consult: Acute kidney injury            History of Present Illness: [name] [name] is a 57 y.o.  female with past medical history including GERD, hypertension, sleep apnea, and bipolar 1 disorder, who was admitted to [facility] on 05/21/2022 for Delirium [R41.0] Hyponatremia [E87.1] Cellulitis of right foot [L03.115] Septic shock ([place]) [A41.9, R65.21] Severe sepsis ([place]) [A41.9, R65.20] AMS (altered mental status) [R41.82]  Patient seen laying in bed, no family at bedside. Alert and oriented. Responding to simple questioning. States she was having foot pain on ED arrival. Now reports decreased oral intake. Receiving IVF. States her pain is well managed. Room air. No lower extremity edema.  Labs were stable during initial admission. Renal function began to decline on 05/30/22. Creatinine 1.97 with max 3.56 IV contrast on 05/22/22 during CT foot. OR procedure on 05/28/22.   Medications: Outpatient medications: Medications Prior to Admission  Medication Sig Dispense Refill Last Dose   acetaminophen (TYLENOL) 500 MG tablet Take 1 tablet (500 mg total) by mouth every 8 (eight) hours as needed for moderate pain. 100 tablet 2 unknown   albuterol (PROAIR HFA) 108 (90 Base) MCG/ACT inhaler Inhale 2 puffs = 127mg into the lungs every 6 (six) hours as needed for wheezing or shortness of breath. 8.5 g 3 unknown   [EXPIRED] aspirin (ASPIRIN CHILDRENS) 81 MG chewable tablet Chew 1 tablet (81 mg total) by mouth 2 (two) times daily. For 6 weeks for DVT prophylaxis after surgery 84 tablet 0 Past Week   BACTRIM DS 800-160 MG tablet Take 1 tablet by mouth 2 (two) times daily.   Past Week   busPIRone (BUSPAR) 15 MG  tablet Take 15 mg by mouth 3 (three) times daily.   2 Past Week   Calcium Carbonate-Vit D-Min (CALCIUM 1200 PO) Take 1,200 mg by mouth daily.    Past Week   cyclobenzaprine (FLEXERIL) 10 MG tablet Take 10 mg by mouth 3 (three) times daily.   Past Week   DULoxetine (CYMBALTA) 60 MG capsule TAKE ONE CAPSULE BY MOUTH ONCE DAILY 90 capsule 0 Past Week   Erenumab-aooe (AIMOVIG) 70 MG/ML SOAJ administer 165mUNDER THE SKIN every 30 DAYS 1 mL 5 05/05/2022   escitalopram (LEXAPRO) 10 MG tablet Take 10 mg by mouth daily.   Past Week   estradiol (ESTRACE) 1 MG tablet Take 1 tablet (1 mg total) by mouth at bedtime. 90 tablet 3 Past Week   famotidine (PEPCID) 40 MG tablet TAKE ONE TABLET BY MOUTH EVERYDAY AT BEDTIME 90 tablet 0 Past Week   haloperidol (HALDOL) 5 MG tablet Take 1 tablet (5 mg total) by mouth at bedtime. For psychosis   Past Week   lamoTRIgine (LAMICTAL) 100 MG tablet Take 100 mg by mouth daily.   Past Week   levocetirizine (XYZAL) 5 MG tablet TAKE ONE TABLET BY MOUTH EVERY EVENING 90 tablet 0 Past Week  lidocaine (LIDODERM) 5 % Place 1 patch onto the skin daily as needed (pain). Remove & Discard patch within 12 hours or as directed by MD   unknown   meloxicam (MOBIC) 15 MG tablet Take 15 mg by mouth daily.   Past Week   metoprolol succinate (TOPROL-XL) 25 MG 24 hr tablet Take 1 tablet (25 mg total) by mouth daily. 90 tablet 3 unknown   Multiple Vitamins-Minerals (MULTIVITAMIN WITH MINERALS) tablet Take 1 tablet by mouth daily.   Past Week   naloxone (NARCAN) nasal spray 4 mg/0.1 mL Place 0.4 mg into the nose once.   unknown   oxyCODONE-acetaminophen (PERCOCET) 10-325 MG tablet Take 1 tablet by mouth 4 (four) times daily as needed for pain.   Past Week   pantoprazole (PROTONIX) 40 MG tablet Take 1 tablet (40 mg total) by mouth 2 (two) times daily. 180 tablet 3 Past Week   prazosin (MINIPRESS) 2 MG capsule Take 2 mg by mouth at bedtime.   Past Week   pregabalin (LYRICA) 100 MG capsule Take 1  capsule (100 mg total) by mouth 2 (two) times daily. (Patient taking differently: Take 100 mg by mouth 3 (three) times daily.) 90 capsule 3 Past Week   RELISTOR 150 MG TABS Take 450 mg by mouth every morning.   Past Week   solifenacin (VESICARE) 10 MG tablet TAKE ONE TABLET BY MOUTH DAILY 90 tablet 0 Past Week   topiramate (TOPAMAX) 50 MG tablet TAKE ONE TABLET BY MOUTH TWICE DAILY 180 tablet 0 Past Week   traZODone (DESYREL) 100 MG tablet Take 200 mg by mouth at bedtime as needed for sleep.  0 Past Week   LINZESS 290 MCG CAPS capsule TAKE ONE CAPSULE BY MOUTH EVERY MORNING (Patient not taking: Reported on 04/06/2022) 90 capsule 0    methocarbamol (ROBAXIN-750) 750 MG tablet Take 1 tablet (750 mg total) by mouth every 8 (eight) hours as needed for muscle spasms. (Patient not taking: Reported on 05/25/2022) 20 tablet 0 Not Taking   MOVANTIK 25 MG TABS tablet Take 25 mg by mouth daily. (Patient not taking: Reported on 05/25/2022)   Not Taking   promethazine (PHENERGAN) 25 MG tablet Take 1 tablet (25 mg total) by mouth every 8 (eight) hours as needed for nausea or vomiting. (Patient not taking: Reported on 04/06/2022) 20 tablet 0    SUMAtriptan (IMITREX) 50 MG tablet TAKE 1 TAB EVERY 2 HOURS AS NEEDED FOR MIGRAINE. MAY REPEAT IN 2 HOURS IF PERSISTS OR RECURS (Patient not taking: Reported on 05/25/2022) 10 tablet 3 Not Taking    Current medications: Current Facility-Administered Medications  Medication Dose Route Frequency Provider Last Rate Last Admin   0.9 %  sodium chloride infusion  250 mL Intravenous Continuous [name], DPM   Stopped at 05/24/22 1920   0.9 %  sodium chloride infusion   Intravenous Continuous Agbata, Tochukwu, MD 100 mL/hr at 06/02/22 0840 New Bag at 06/02/22 0840   acetaminophen (TYLENOL) tablet 650 mg  650 mg Oral Q6H PRN [name], DPM   650 mg at 05/23/22 1655   Or   acetaminophen (TYLENOL) suppository 650 mg  650 mg Rectal Q6H PRN McDonald, Stephan Minister, DPM        albuterol (PROVENTIL) (2.5 MG/3ML) 0.083% nebulizer solution 3 mL  3 mL Nebulization Q4H PRN McDonald, Stephan Minister, DPM       aspirin chewable tablet 81 mg  81 mg Oral BID McDonald, Adam R, DPM   81 mg at  06/02/22 0834   busPIRone (BUSPAR) tablet 15 mg  15 mg Oral TID [name], DPM   15 mg at 06/02/22 0834   Chlorhexidine Gluconate Cloth 2 % PADS 6 each  6 each Topical Q0600 [name], DPM   6 each at 06/02/22 [phone]   DAPTOmycin (CUBICIN) 500 mg in sodium chloride 0.9 % IVPB  500 mg Intravenous Q48H Zeigler, Sandi Mealy, RPH       DULoxetine (CYMBALTA) DR capsule 20 mg  20 mg Oral Daily Lanae Crumbly R, DPM   20 mg at 06/02/22 0834   enoxaparin (LOVENOX) injection 30 mg  30 mg Subcutaneous Q24H Alison Murray, RPH   30 mg at 06/01/22 2122   escitalopram (LEXAPRO) tablet 10 mg  10 mg Oral Daily [name], DPM   10 mg at 06/02/22 [id number]   estradiol (ESTRACE) tablet 1 mg  1 mg Oral QHS [name], DPM   1 mg at 06/01/22 2125   insulin aspart (novoLOG) injection 0-6 Units  0-6 Units Subcutaneous TID WC Emeterio Reeve, DO       lamoTRIgine (LAMICTAL) tablet 100 mg  100 mg Oral Daily [name], DPM   100 mg at 06/02/22 0834   naloxegol oxalate (MOVANTIK) tablet 25 mg  25 mg Oral Daily Enzo Bi, MD   25 mg at 06/02/22 0834   ondansetron (ZOFRAN) injection 4 mg  4 mg Intravenous Q6H PRN [name], DPM   4 mg at 05/26/22 1450   Oral care mouth rinse  15 mL Mouth Rinse PRN [name], DPM       oxyCODONE-acetaminophen (PERCOCET) 7.5-325 MG per tablet 1 tablet  1 tablet Oral 5 X Daily PRN [name], DPM   1 tablet at 06/02/22 0836   pregabalin (LYRICA) capsule 50 mg  50 mg Oral TID Emeterio Reeve, DO   50 mg at 06/02/22 0834   sodium chloride flush (NS) 0.9 % injection 3 mL  3 mL Intravenous Q12H Lanae Crumbly R, DPM   3 mL at 06/02/22 0839   sodium zirconium cyclosilicate (LOKELMA) packet 10 g  10 g Oral Daily Agbata, Tochukwu, MD   10 g at 06/02/22  1218   topiramate (TOPAMAX) tablet 50 mg  50 mg Oral BID [name], DPM   50 mg at 06/02/22 [id number]      Allergies: Allergies  Allergen Reactions   Other    Tetracyclines & Related Other (See Comments)    Syncope and put her "in a coma"   Tramadol Other (See Comments)    Seizures   Phenazopyridine Other (See Comments)    Unknown   Tetracycline Other (See Comments)    Syncope and "Put me in a coma"   Ciprofloxacin Rash and Itching   Codeine Itching and Rash   Estradiol Rash    Patches broke out the skin      Past Medical History: Past Medical History:  Diagnosis Date   Anxiety    Arthritis    Phreesia 02/08/2020   Asthma    mild intermittent   Asthma    Phreesia 02/08/2020   Bipolar 1 disorder ([place])    ect treatments last treatment Sep 02 1011   Depression    Depression    Phreesia 02/08/2020   Depression    Phreesia 07/10/2020   GERD (gastroesophageal reflux disease)    Headache    Hypertension    Pre-diabetes    Seizures ([place])  Thinks it was from taking Tramadol   Sleep apnea    wears CPAP, uncertain of setting   Substance abuse ([place])    Phreesia 02/08/2020     Past Surgical History: Past Surgical History:  Procedure Laterality Date   ABDOMINAL HYSTERECTOMY     AMPUTATION TOE Left 02/02/2018   Procedure: AMPUTATION TOE Left 4th toe;  Surgeon: [name], DPM;  Location: [facility];  Service: Podiatry;  Laterality: Left;   APPENDECTOMY N/A    Phreesia 02/08/2020   BACK SURGERY  2018   ACDF   C5-6 & C6-7 by Dr. Rennis Harding   CARPAL TUNNEL RELEASE     x2   I & D EXTREMITY Right 05/22/2022   Procedure: IRRIGATION AND DEBRIDEMENT EXTREMITY;  Surgeon: Felipa Furnace, DPM;  Location: [facility];  Service: Podiatry;  Laterality: Right;   INCISION AND DRAINAGE Right 05/24/2022   Procedure: RIGHT REVISION INCISION AND DRAINAGE/ WASHOUT WITH PRIMARY DELAYED CLOSURE;  Surgeon: [name], DPM;  Location: [facility];  Service: Podiatry;  Laterality:  Right;   IRRIGATION AND DEBRIDEMENT ABSCESS Right 10/25/2020   Procedure: IRRIGATION AND DEBRIDEMENT ABSCESS OF FOOT AND APPLICATION OF GRAFT;  Surgeon: [name], DPM;  Location: [facility];  Service: Podiatry;  Laterality: Right;   Laproscopic knee surgery Right    NECK SURGERY  03/15/2017   ACDF   by Dr. Patrice Paradise   PLANTAR FASCIA RELEASE     x2   REVERSE SHOULDER ARTHROPLASTY Right 04/14/2022   Procedure: REVERSE SHOULDER ARTHROPLASTY;  Surgeon: Hiram Gash, MD;  Location: [facility];  Service: Orthopedics;  Laterality: Right;   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   Procedure: TRANSMETATARSAL AMPUTATION;  Surgeon: [name], DPM;  Location: [facility];  Service: Podiatry;  Laterality: Right;   TRANSMETATARSAL AMPUTATION Right 05/27/2022   Procedure: REVISION TRANSMETATARSAL AMPUTATION WITH DELAYED PRIMARY CLOSURE;  Surgeon: [name], DPM;  Location: [facility];  Service: Podiatry;  Laterality: Right;     Family History: Family History  Problem Relation Age of Onset   Diabetes Mother    COPD Mother    Hypertension Mother    Hyperlipidemia Mother    Sleep apnea Mother    Heart disease Father    Hyperlipidemia Father    Hypertension Father    Cancer Father    Sleep apnea Father    Heart disease Brother    Sleep apnea Brother    Heart disease Maternal Grandmother    Hypertension Maternal Grandmother    Sleep apnea Maternal Grandmother    Heart disease Maternal Grandfather    Kidney cancer Paternal Grandmother    Heart disease Paternal Grandfather    Heart disease Daughter      Social History: Social History   Socioeconomic History   Marital status: Widowed    Spouse name: Not on file   Number of children: 3   Years of education: 21   Highest education level: Not on file  Occupational History   Occupation: Disabled  Tobacco Use   Smoking status: Never   Smokeless tobacco: Never  Vaping Use   Vaping Use: Never used  Substance and Sexual Activity    Alcohol use: No   Drug use: No   Sexual activity: Not Currently  Other Topics Concern   Not on file  Social History Narrative   Patient drink 4 16oz caffeine drinks a day    Social Determinants of Health   Financial Resource Strain: Low Risk  (08/21/2020)  Overall Financial Resource Strain (CARDIA)    Difficulty of Paying Living Expenses: Not hard at all  Food Insecurity: No Food Insecurity (05/22/2022)   Hunger Vital Sign    Worried About Running Out of Food in the Last Year: Never true    Ran Out of Food in the Last Year: Never true  Transportation Needs: No Transportation Needs (05/22/2022)   PRAPARE - Hydrologist (Medical): No    Lack of Transportation (Non-Medical): No  Physical Activity: Inactive (08/21/2020)   Exercise Vital Sign    Days of Exercise per Week: 0 days    Minutes of Exercise per Session: 0 min  Stress: No Stress Concern Present (08/21/2020)   [place]    Feeling of Stress : Not at all  Social Connections: Socially Isolated (08/21/2020)   Social Connection and Isolation Panel [NHANES]    Frequency of Communication with Friends and Family: More than three times a week    Frequency of Social Gatherings with Friends and Family: Twice a week    Attends Religious Services: Never    Marine scientist or Organizations: No    Attends Archivist Meetings: Never    Marital Status: Widowed  Intimate Partner Violence: Not At Risk (05/22/2022)   Humiliation, Afraid, Rape, and Kick questionnaire    Fear of Current or Ex-Partner: No    Emotionally Abused: No    Physically Abused: No    Sexually Abused: No     Review of Systems: Review of Systems  Constitutional:  Negative for chills, fever and malaise/fatigue.  HENT:  Negative for congestion, sore throat and tinnitus.   Eyes:  Negative for blurred vision and redness.  Respiratory:  Negative for cough, shortness of  breath and wheezing.   Cardiovascular:  Negative for chest pain, palpitations, claudication and leg swelling.  Gastrointestinal:  Negative for abdominal pain, blood in stool, diarrhea, nausea and vomiting.  Genitourinary:  Negative for flank pain, frequency and hematuria.  Musculoskeletal:  Negative for back pain, falls and myalgias.       Rt foot pain  Skin:  Negative for rash.  Neurological:  Negative for dizziness, weakness and headaches.  Endo/Heme/Allergies:  Does not bruise/bleed easily.  Psychiatric/Behavioral:  Negative for depression. The patient is not nervous/anxious and does not have insomnia.     Vital Signs: Blood pressure (!) 149/89, pulse 67, temperature (!) 97.5 F (36.4 C), resp. rate 18, height 5' 4"$  (1.626 m), weight 104.3 kg, SpO2 99 %.  Weight trends: Filed Weights   05/22/22 1221 05/24/22 1637 05/27/22 1457  Weight: 108.1 kg 108 kg 104.3 kg    Physical Exam: General: NAD, withdrawn  Head: Normocephalic, atraumatic. Moist oral mucosal membranes  Eyes: Anicteric  Lungs:  Clear to auscultation, normal effort  Heart: Regular rate and rhythm  Abdomen:  Soft, nontender, obese  Extremities:  No peripheral edema.  Neurologic: Withdrawn  Skin: No lesions, right foot surgical dressing  Access: None     Lab results: Basic Metabolic Panel: Recent Labs  Lab 05/27/22 0144 05/28/22 0502 05/31/22 2253 06/01/22 0504 06/02/22 0418  NA 136   < > 126* 124* 128*  K 3.4*   < > 4.3 4.5 5.3*  CL 106   < > 98 99 104  CO2 23   < > 19* 17* 17*  GLUCOSE 107*   < > 108* 108* 99  BUN 9   < > 58*  61* 70*  CREATININE 0.51   < > 3.55* 3.39* 2.75*  CALCIUM 8.6*   < > 8.0* 7.9* 8.1*  MG 2.3  --   --   --   --   PHOS  --   --   --   --  9.0*   < > = values in this interval not displayed.    Liver Function Tests: Recent Labs  Lab 06/02/22 0418  ALBUMIN 2.4*   No results for input(s): "LIPASE", "AMYLASE" in the last 168 hours. No results for input(s): "AMMONIA" in  the last 168 hours.  CBC: Recent Labs  Lab 05/27/22 0144 05/28/22 0502 05/30/22 0321 05/31/22 0420 06/02/22 0414  WBC 12.1* 9.0 8.9 7.7 6.5  HGB 10.6* 10.1* 9.3* 8.9* 7.7*  HCT 32.1* 31.4* 28.2* 27.3* 23.9*  MCV 95.8 96.3 95.9 95.1 96.8  PLT 309 338 325 339 280    Cardiac Enzymes: Recent Labs  Lab 06/01/22 0504  CKTOTAL 12*    BNP: Invalid input(s): "POCBNP"  CBG: Recent Labs  Lab 06/01/22 1216 06/01/22 1725 06/01/22 2042 06/02/22 0815 06/02/22 1219  GLUCAP 91 105* 87 83 86    Microbiology: Results for orders placed or performed during the hospital encounter of 05/21/22  Resp panel by RT-PCR (RSV, Flu A&B, Covid) Anterior Nasal Swab     Status: None   Collection Time: 05/21/22 11:39 PM   Specimen: Anterior Nasal Swab  Result Value Ref Range Status   SARS Coronavirus 2 by RT PCR NEGATIVE NEGATIVE Final    Comment: (NOTE) SARS-CoV-2 target nucleic acids are NOT DETECTED.  The SARS-CoV-2 RNA is generally detectable in upper respiratory specimens during the acute phase of infection. The lowest concentration of SARS-CoV-2 viral copies this assay can detect is 138 copies/mL. A negative result does not preclude SARS-Cov-2 infection and should not be used as the sole basis for treatment or other patient management decisions. A negative result may occur with  improper specimen collection/handling, submission of specimen other than nasopharyngeal swab, presence of viral mutation(s) within the areas targeted by this assay, and inadequate number of viral copies(<138 copies/mL). A negative result must be combined with clinical observations, patient history, and epidemiological information. The expected result is Negative.  Fact Sheet for Patients:  EntrepreneurPulse.com.au  Fact Sheet for Healthcare Providers:  IncredibleEmployment.be  This test is no t yet approved or cleared by the [place] FDA and  has been authorized  for detection and/or diagnosis of SARS-CoV-2 by FDA under an Emergency Use Authorization (EUA). This EUA will remain  in effect (meaning this test can be used) for the duration of the COVID-19 declaration under Section 564(b)(1) of the Act, 21 U.S.C.section 360bbb-3(b)(1), unless the authorization is terminated  or revoked sooner.       Influenza A by PCR NEGATIVE NEGATIVE Final   Influenza B by PCR NEGATIVE NEGATIVE Final    Comment: (NOTE) The Xpert Xpress SARS-CoV-2/FLU/RSV plus assay is intended as an aid in the diagnosis of influenza from Nasopharyngeal swab specimens and should not be used as a sole basis for treatment. Nasal washings and aspirates are unacceptable for Xpert Xpress SARS-CoV-2/FLU/RSV testing.  Fact Sheet for Patients: EntrepreneurPulse.com.au  Fact Sheet for Healthcare Providers: IncredibleEmployment.be  This test is not yet approved or cleared by the [place] FDA and has been authorized for detection and/or diagnosis of SARS-CoV-2 by FDA under an Emergency Use Authorization (EUA). This EUA will remain in effect (meaning this test can be used) for the duration of  the COVID-19 declaration under Section 564(b)(1) of the Act, 21 U.S.C. section 360bbb-3(b)(1), unless the authorization is terminated or revoked.     Resp Syncytial Virus by PCR NEGATIVE NEGATIVE Final    Comment: (NOTE) Fact Sheet for Patients: EntrepreneurPulse.com.au  Fact Sheet for Healthcare Providers: IncredibleEmployment.be  This test is not yet approved or cleared by the [place] FDA and has been authorized for detection and/or diagnosis of SARS-CoV-2 by FDA under an Emergency Use Authorization (EUA). This EUA will remain in effect (meaning this test can be used) for the duration of the COVID-19 declaration under Section 564(b)(1) of the Act, 21 U.S.C. section 360bbb-3(b)(1), unless the authorization is  terminated or revoked.  Performed at [facility], [address] [place], [place] [address]   Blood Culture (routine x 2)     Status: None   Collection Time: 05/21/22 11:39 PM   Specimen: BLOOD  Result Value Ref Range Status   Specimen Description BLOOD LEFT ANTECUBITAL  Final   Special Requests   Final    BOTTLES DRAWN AEROBIC AND ANAEROBIC Blood Culture adequate volume   Culture   Final    NO GROWTH 5 DAYS Performed at [facility], [address] [place], [place] [address]    Report Status 05/26/2022 FINAL  Final  Blood Culture (routine x 2)     Status: None   Collection Time: 05/21/22 11:44 PM   Specimen: BLOOD  Result Value Ref Range Status   Specimen Description BLOOD RIGHT ANTECUBITAL  Final   Special Requests   Final    BOTTLES DRAWN AEROBIC AND ANAEROBIC Blood Culture results may not be optimal due to an excessive volume of blood received in culture bottles   Culture   Final    NO GROWTH 5 DAYS Performed at [facility], [address], [place], [place] [address]    Report Status 05/27/2022 FINAL  Final  MRSA Next Gen by PCR, Nasal     Status: None   Collection Time: 05/22/22 12:26 PM   Specimen: Nasal Mucosa; Nasal Swab  Result Value Ref Range Status   MRSA by PCR Next Gen NOT DETECTED NOT DETECTED Final    Comment: (NOTE) The GeneXpert MRSA Assay (FDA approved for NASAL specimens only), is one component of a comprehensive MRSA colonization surveillance program. It is not intended to diagnose MRSA infection nor to guide or monitor treatment for MRSA infections. Test performance is not FDA approved in patients less than 68 years old. Performed at [facility], [address] [place], [place] [address]   Aerobic/Anaerobic Culture w Gram Stain (surgical/deep wound)     Status: None   Collection Time: 05/22/22  4:38 PM   Specimen: Foot, Right; Abscess  Result Value Ref Range Status   Specimen Description   Final     FOOT Performed at [facility], [address], [place], [place] [address]    Special Requests   Final    RT FOOT ABSCESS Performed at [facility], [address] [place], [place] [address]    Gram Stain   Final    RARE WBC PRESENT,BOTH PMN AND MONONUCLEAR RARE GRAM POSITIVE COCCI IN PAIRS IN CLUSTERS    Culture   Final    MODERATE METHICILLIN RESISTANT STAPHYLOCOCCUS AUREUS NO ANAEROBES ISOLATED Performed at [facility], [address], [place], [place] [address]    Report Status 05/27/2022 FINAL  Final   Organism ID, Bacteria METHICILLIN RESISTANT STAPHYLOCOCCUS AUREUS  Final      Susceptibility   Methicillin resistant staphylococcus aureus - MIC*    CIPROFLOXACIN <=0.5 SENSITIVE Sensitive     ERYTHROMYCIN >=8 RESISTANT Resistant     GENTAMICIN <=0.5 SENSITIVE Sensitive     OXACILLIN >=4 RESISTANT Resistant     TETRACYCLINE <=1 SENSITIVE Sensitive     VANCOMYCIN <=0.5 SENSITIVE Sensitive     TRIMETH/SULFA <=10 SENSITIVE Sensitive     CLINDAMYCIN <=0.25 SENSITIVE Sensitive     RIFAMPIN <=0.5 SENSITIVE Sensitive     Inducible Clindamycin NEGATIVE Sensitive     * MODERATE METHICILLIN RESISTANT STAPHYLOCOCCUS AUREUS  Aerobic/Anaerobic Culture w Gram Stain (surgical/deep wound)     Status: None   Collection Time: 05/27/22  3:59 PM   Specimen: PATH Other; Tissue  Result Value Ref Range Status   Specimen Description   Final    WOUND RIGHT FOOT AMPUTATION Performed at [facility], [place] [address] [place], [place] [address]    Special Requests   Final    NONE Performed at [facility], [place], [place] [address]    Gram Stain NO WBC SEEN NO ORGANISMS SEEN   Final   Culture   Final    No growth aerobically or anaerobically. Performed at [facility], [place] [address] [place], [place] [address]    Report Status 06/01/2022 FINAL  Final   *Note: Due to a large number of results and/or encounters for the  requested time period, some results have not been displayed. A complete set of results can be found in Results Review.    Coagulation Studies: No results for input(s): "LABPROT", "INR" in the last 72 hours.  Urinalysis: Recent Labs    05/31/22 0829  COLORURINE AMBER*  LABSPEC 1.017  PHURINE 5.0  GLUCOSEU NEGATIVE  HGBUR MODERATE*  BILIRUBINUR NEGATIVE  KETONESUR NEGATIVE  PROTEINUR 100*  NITRITE NEGATIVE  LEUKOCYTESUR NEGATIVE      Imaging: US RENAL  Result Date: 06/02/2022 CLINICAL DATA:  Acute kidney failure, unspecified ([place]) EXAM: RENAL / URINARY TRACT ULTRASOUND COMPLETE COMPARISON:  CT 06/30/2021. FINDINGS: Right Kidney: Renal measurements: 12.8 x 5.2 x 6.3 cm = volume: 220.3 mL. Echogenicity within normal limits. No mass or hydronephrosis visualized. Left Kidney: Renal measurements: 11.9 x 6.4 x 6.5 cm = volume: 261.4 mL. Echogenicity within normal limits. No mass or hydronephrosis visualized. Bladder: Appears normal for degree of bladder distention. Other: None. IMPRESSION: No hydronephrosis. Electronically Signed   By: Maurine Simmering M.D.   On: 06/02/2022 12:22     Assessment & Plan: [name] [name] is a 57 y.o.  female with past medical history including GERD, hypertension, sleep apnea, and bipolar 1 disorder, who was admitted to [facility] on 05/21/2022 for Delirium [R41.0] Hyponatremia [E87.1] Cellulitis of right foot [L03.115] Septic shock ([place]) [A41.9, R65.21] Severe sepsis ([place]) [A41.9, R65.20] AMS (altered mental status) [R41.82]   Acute kidney injury appears multifactorial. Offending agents include IV contrast, possible hypotension, and poor oral intake. Normal renal function noted on admission. IV contrast exposure on 05/22/22, kidney injury may be related to delayed nephropathy. May have had hypotension during surgical procedure. Denies nausea and vomiting but has had poor oral intake prior to admission. Agree with IVF for now. Renal ultrasound negative. Creatinine  has slowly began to improve. Avoid nephrotoxic agents and therapies, including hypotension. No acute need for dialysis at this time.   2. Acute metabolic acidosis, Serum bicarb 17 today. Would consider changing IVF to sodium bicarb  3. Hyperkalemia, likely secondary to kidney injury. Lokelma ordered by primary team.   LOS: [place] 2/14/20241:14 PM

## 2022-06-02 NOTE — Consult Note (Signed)
Pharmacy Antibiotic Note  [name] [name] is a 57 y.o. female admitted on 05/21/2022 with AMS and right foot stump pain and redness. 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 Taking PO cephalexin PTA and recent Rx for PO bactrim 05/20/22 (unsure if started). Pharmacy has been consulted for vancomycin dosing.   - vancomycin stopped 2/5 as previous culture with MSSA, current culture with MRSA so resume vancomycin. Last dose was 2/5 at 0400 - Vancomycin stopped 2/11 am with last dose being given 2/10 at 21:21 for AKI notes 2/11 am (SCr 0.69 to 1.97)  Today, 06/02/2022 Day #11 antibiotics Renal: AKI - SCr 2.75 (improving) WBC improved to WNL (no CBC today) Afebrile CK 2/13: 12 OR 2/3 for I&D and 2/5 repeat I&D (wound remains open) Does not appear to be bone involvement per podiatry 2/3 OR culture: MRSA - susc to tetracycline (allergy listed) and TMP/SMZ.  Final culture pending Returned to OR 2/8 revision of TMA and wound closure Cultures: NGTD Pathology: no evidence of acute osteomyelitis.   Vancomycin levels: Vancomycin 1530m IV q24h (dose 2/8 at a10:04), peak = 27 mcg/ml at 12:29 2/8 and random vancomycin level = 7 mcg/ml at 05:02 2/9 AUC = 352.8 w/ Cmax = 30.2 mcg/ml and Cmin = 5.4 mcg/mL 2/11 Random vancomycin level at 08:58 = 34 (last dose 2/10 at21:21) 2/12 random vancomycin level at 04:20 = 24 (last dose 2/10 at 21:21) 2/13 random vancomycin level at 05:04 = 17 (last dose 2/10) 2/14 Random vancomycin level at 04:18 = 12 (last dose 2/10)  Plan: Based on Vancomycin level approaching 147m/ml, start Daptomycin 50045m6.7mg89m per adjusted body weight) IV q48h  Weekly CK Follow renal function closely Oral options limited d/t drug interactions and allergies  Height: 5' 4"$  (162.6 cm) Weight: 104.3 kg (230 lb) IBW/kg (Calculated) : 54.7  Temp (24hrs), Avg:97.8 F (36.6 C), Min:97.5 F (36.4 C), Max:97.9 F (36.6 C)  Recent Labs  Lab  05/27/22 0144 05/27/22 1329 05/28/22 0502 05/30/22 0321 05/30/22 0858 05/31/22 0420 05/31/22 1213 05/31/22 1739 05/31/22 2253 06/01/22 0504 06/02/22 0418  WBC 12.1*  --  9.0 8.9  --  7.7  --   --   --   --   --   CREATININE 0.51  --  0.69 1.97*  --  3.22* 3.56* 3.54* 3.55* 3.39* 2.75*  VANCOPEAK  --  27*  --   --   --   --   --   --   --   --   --   VANCORANDOM  --   --  8  --    < > 24  --   --   --  17 12   < > = values in this interval not displayed.     Estimated Creatinine Clearance: 26.2 mL/min (A) (by C-G formula based on SCr of 2.75 mg/dL (H)).    Allergies  Allergen Reactions   Other    Tetracyclines & Related Other (See Comments)    Syncope and put her "in a coma"   Tramadol Other (See Comments)    Seizures   Phenazopyridine Other (See Comments)    Unknown   Tetracycline Other (See Comments)    Syncope and "Put me in a coma"   Ciprofloxacin Rash and Itching   Codeine Itching and Rash   Estradiol Rash    Patches broke out the skin    Antimicrobials this admission: 2/5 Unasyn >> 2/7 2/3 Vancomycin >> 2/14 2/3 Meropenem >>  2/5 2/3 Ceftriaxone x 1 dose  2/14 daptomycin >>  Dose adjustments this admission:   Microbiology results: 2/2 BCx: NGTD 2/3: R foot abscess cx: mod MRSA 2/3 MRSA PCR neg 2/8 Foot amputation: NGTD  Thank you for allowing pharmacy to be a part of this patient's care.  Doreene Eland, PharmD, BCPS, BCIDP Work Cell: [phone] 06/02/2022 8:31 AM

## 2022-06-02 NOTE — Progress Notes (Signed)
Patient is not voiding all night bladder scan done at 2126, 209 ml recorded. K Foust notified. Bladder scan done at 0527, 258 ml recorded. Patient state that she doesn't have the urged to void.K. Foust notified.

## 2022-06-02 NOTE — Progress Notes (Signed)
PT Cancellation Note  Patient Details Name: [name] [name] MRN: [id number] DOB: 1965/07/03   Cancelled Treatment:     PT attempt. 3rd attempt today. Pt refused. First 2 attempts pt reports not feeling well. She has presented with lethargy throughout the day today. This attempt, pt refused since she just had foley cath placed and is not wanting PT at this time. "Can you just come back tomorrow?" Author discussed importance of getting OOB daily and importance of routine mobility. "I'll get up later with the RN staff." Also reviewed importance of maintaining NWB for wound healing. As requested, [name] will return tomorrow.    Willette Pa 06/02/2022, 2:11 PM

## 2022-06-02 NOTE — Progress Notes (Addendum)
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  02/02: to ED as above. Septic shock, required brief levo gtt. 02/03: admitted to hospitalist service, underwent I&D w/ podiatry 02/04: stable on IV abx awaiting Cx 02/05: back to OR for revision I&D.   02/06-02/07: stable, planning back to OR 02/08. Cultures from 02/03 (+)MRSA 02/08: to OR for revision of RLE TMA. Per ID, continue vanco and send bone for pathology and culture  02/09: podiatry and ID recs for continue inpatient for now to ensure surgical stabilization and await culture results to guide abx, will likely be here thru the weekend (today is Friday)   02/10-02/11: stable, awaiting cultures 02/12: Surgical pathology resulted: no osteomyelitis. Cr jumped up today, pt reports no urine yesterday. IV fluids. UA no infection, FeNa prerenal. ID and pharmacy following and adjusting abx and other possible nephrotoxic medications. 600 mL UOP today, Cr worsening this afternoon, will give additional 1L fluids, nephrology consulted.  02/13: minimal improvement Cr, worsening hyponatremia, nephrology recs pending. 02/14: Continues to complain about inability to void.  Bladder scan done at noon had 541 cc of urine.  Serum creatinine peaked at 3.56 and today it is 2.75.  Will place Foley catheter    Assessment and Plan:  Principal Problem:   Cellulitis of right foot Active Problems:   Severe sepsis ([name])   GERD (gastroesophageal reflux disease)   Essential hypertension   AMS (altered mental status)   Septic  shock ([name])   Abscess of right foot   Septic shock - POA - resolved  Right foot cellulitis and abscess at site of prior TMA with history of prior ulceration (+)MRSA S/p return to the OR on 05/27/2022 for repeat washout, revisional amputation and resection of necrotic bone and soft tissue and primary closure of the amputation site. Surgical path no osteomyelitis   Podiatry ok for po abx (doxycycline), podiatry team has signed off 02/12. Follow-up in 1 to 2 weeks with Dr. Amalia [name]  Wound culture yielded MRSA.  Patient has been on IV vancomycin and last dose was on 05/29/22 Started on daptomycin on 06/02/22.  Duration of antibiotic therapy to be determined by infectious disease   AKI with hyperkalemia and hyponatremia Sharp increase in Cr.  AKI is thought to be multifactorial and related to IV contrast and possible hypotension during surgical procedure as well as poor oral intake. Serum creatinine peaked at 3.5 6 and shows a downward trend. 2.75 today Decrease po intake per RN and patient Decreased UOP per RN and patient  Prerenal FeNa Continue IV fluid hydration.  Improvement in serum sodium levels with hydration Follow BMP closely nephrology consulted, appreciate recs  Start patient on Lokelma for hyperkalemia Will discuss with ID and discontinue vancomycin Avoid nephrotoxic agents      Depression anxiety bipolar disorder history of seizure disorder cont Buspar, Cymbalta and Lexapro  cont lamotrigine and Topamax   Chronic pain syndrome recent right shoulder arthroplasty cont home Percocet and Lyrica - dose reduced d/t renal    Morbid obesity, BMI [id number] Complicates treatment and prognosis   Sleep apnea  CPAP nightly    Anemia (acute on chronic) Patient with baseline hemoglobin of 10 which has dropped to 7.7 No obvious source of bleeding at this time Obtain iron studies Hold aspirin and Lovenox           Physical Exam: Vitals:   06/01/22 0744 06/01/22 1521 06/01/22  2329 06/02/22 0815  BP: 128/79 130/73 128/76 (!) 149/89  Pulse: 65 72 65 67  Resp: 17 17 16 18  $ Temp: 98 F (36.7 C) 97.9 F (36.6 C) 97.9 F (36.6 C) (!) 97.5 F (36.4 C)  TempSrc:      SpO2: 99% 100% 99% 99%  Weight:      Height:       Physical Exam Vitals and nursing note reviewed.  Constitutional:      Appearance: She is obese.  HENT:     Head: Normocephalic.     Nose: Nose normal.     Mouth/Throat:     Mouth: Mucous membranes are moist.  Eyes:     Conjunctiva/sclera: Conjunctivae normal.  Cardiovascular:     Rate and Rhythm: Normal rate and regular rhythm.  Pulmonary:     Effort: Pulmonary effort is normal.     Breath sounds: Normal breath sounds.  Abdominal:     General: Bowel sounds are normal.     Palpations: Abdomen is soft.     Comments: Central adiposity  Musculoskeletal:        General: Normal range of motion.     Cervical back: Normal range of motion and neck supple.     Comments: Dressing over right TMA stump  Skin:    General: Skin is warm and dry.  Neurological:     Mental Status: She is alert and oriented to person, place, and time.     Motor: Weakness present.  Psychiatric:     Comments: Flat affect     Data Reviewed: Labs reviewed. Hemoglobin 7.7, sodium 128 up from 124, potassium 5.3, bicarbonate 17, serum creatinine 2.75, BUN 70 There are no new results to review at this time.  Family Communication: Discussed plan of care with patient at the bedside.  She verbalizes understanding and agrees with  Disposition: Status is: Inpatient Remains inpatient appropriate because: Continues to require IV antibiotics for right TMA stump infection  Planned Discharge Destination: Home with Home Health    Time spent: 35  minutes  Author: Collier Bullock, MD 06/02/2022 2:17 PM  For on call review [email or web address].

## 2022-06-03 DIAGNOSIS — E871 Hypo-osmolality and hyponatremia: Secondary | ICD-10-CM

## 2022-06-03 LAB — GLUCOSE, CAPILLARY
Glucose-Capillary: 87 mg/dL (ref 70–99)
Glucose-Capillary: 94 mg/dL (ref 70–99)
Glucose-Capillary: 123 mg/dL — ABNORMAL HIGH (ref 70–99)
Glucose-Capillary: 103 mg/dL — ABNORMAL HIGH (ref 70–99)

## 2022-06-03 LAB — BASIC METABOLIC PANEL
Anion gap: 8 (ref 5–15)
Anion gap: 8 (ref 5–15)
BUN: 74 mg/dL — ABNORMAL HIGH (ref 6–20)
BUN: 76 mg/dL — ABNORMAL HIGH (ref 6–20)
CO2: 17 mmol/L — ABNORMAL LOW (ref 22–32)
CO2: 19 mmol/L — ABNORMAL LOW (ref 22–32)
Calcium: 7.9 mg/dL — ABNORMAL LOW (ref 8.9–10.3)
Calcium: 8 mg/dL — ABNORMAL LOW (ref 8.9–10.3)
Chloride: 103 mmol/L (ref 98–111)
Chloride: 101 mmol/L (ref 98–111)
Creatinine, Ser: 2.16 mg/dL — ABNORMAL HIGH (ref 0.44–1.00)
Creatinine, Ser: 2.08 mg/dL — ABNORMAL HIGH (ref 0.44–1.00)
GFR, Estimated: 26 mL/min — ABNORMAL LOW (ref >60)
GFR, Estimated: 27 mL/min — ABNORMAL LOW (ref >60)
Glucose, Bld: 95 mg/dL (ref 70–99)
Glucose, Bld: 97 mg/dL (ref 70–99)
Potassium: 4.6 mmol/L (ref 3.5–5.1)
Potassium: 4.3 mmol/L (ref 3.5–5.1)
Sodium: 128 mmol/L — ABNORMAL LOW (ref 135–145)
Sodium: 128 mmol/L — ABNORMAL LOW (ref 135–145)

## 2022-06-03 LAB — CBC
HCT: 24.9 % — ABNORMAL LOW (ref 36.0–46.0)
Hemoglobin: 8.2 g/dL — ABNORMAL LOW (ref 12.0–15.0)
MCH: 31.2 pg (ref 26.0–34.0)
MCHC: 32.9 g/dL (ref 30.0–36.0)
MCV: 94.7 fL (ref 80.0–100.0)
Platelets: 302 10*3/uL (ref 150–400)
RBC: 2.63 MIL/uL — ABNORMAL LOW (ref 3.87–5.11)
RDW: 13.9 % (ref 11.5–15.5)
WBC: 6.3 10*3/uL (ref 4.0–10.5)
nRBC: 0 % (ref 0.0–0.2)

## 2022-06-03 LAB — KAPPA/LAMBDA LIGHT CHAINS
Kappa free light chain: 117.1 mg/L — ABNORMAL HIGH (ref 3.3–19.4)
Kappa, lambda light chain ratio: 1.9 — ABNORMAL HIGH (ref 0.26–1.65)
Lambda free light chains: 61.5 mg/L — ABNORMAL HIGH (ref 5.7–26.3)

## 2022-06-03 MED ORDER — SODIUM CHLORIDE 0.9 % IV SOLN
500.0000 mg | Freq: Every day | INTRAVENOUS | Status: DC
  Administered 2022-06-03 – 2022-06-05 (×3): 500 mg via INTRAVENOUS
  Filled 2022-06-03 (×3): qty 10

## 2022-06-03 MED ORDER — CHLORHEXIDINE GLUCONATE CLOTH 2 % EX PADS
6.0000 | MEDICATED_PAD | Freq: Every day | CUTANEOUS | Status: DC
  Administered 2022-06-03 – 2022-06-14 (×11): 6 via TOPICAL

## 2022-06-03 MED ORDER — SODIUM BICARBONATE 8.4 % IV SOLN
50.0000 meq | Freq: Once | INTRAVENOUS | Status: AC
  Administered 2022-06-03: 50 meq via INTRAVENOUS
  Filled 2022-06-03: qty 50

## 2022-06-03 NOTE — Progress Notes (Signed)
Physical Therapy Treatment Patient Details Name: [name] [name] MRN: [id number] DOB: 09-20-1965 Today's Date: 06/03/2022   History of Present Illness 57 year old female with history of bipolar disorder, total arthroplasty right shoulder April 14, 2022, chronic pain syndrome, anxiety depression, recent right toe amputation who presented to emergency room with altered mental status and redness of the right foot amputation.    PT Comments    Pt was long sitting in bed upon arrival. She remains lethargic in presentation however was agreeable to session and OOB activity. Pt needs increased time to process and increased time to perform all desired task. She was able to exit L side of bed, stand from elevated bed height and pivot to recliner. Pt struggles with adhering to NWB so PT continues to elect not to endorse or progress with ambulation. Author called pt's mother to confirm baseline abilities and pt's prior level of function. Per pt's mother, she was not very active or mobile at baseline. She spent a lot of her day in recliner and naps a lot even at baseline. Discussed pt's current level of function and confirmed assistance available at DC. Mother does state between her grandson and herself, they can take care of pt at DC. Pt is wanting to DC home. Acute PT will continue to encouraged increased physical activity and continued progress towards goals. Follow MDs recs for DC disposition.     Recommendations for follow up therapy are one component of a multi-disciplinary discharge planning process, led by the attending physician.  Recommendations may be updated based on patient status, additional functional criteria and insurance authorization.  Follow Up Recommendations  Follow physician's recommendations for discharge plan and follow up therapies Can patient physically be transported by private vehicle: Yes   Assistance Recommended at Discharge Frequent or constant Supervision/Assistance   Patient can return home with the following A little help with walking and/or transfers;A little help with bathing/dressing/bathroom;Assistance with cooking/housework;Direct supervision/assist for medications management;Direct supervision/assist for financial management;Assist for transportation;Help with stairs or ramp for entrance   Equipment Recommendations  None recommended by PT (pt endorses having w/c, ramp, lift chair, RW, jknee scooter, and BSC already. May benefit from hospital bed if willing)       Precautions / Restrictions Precautions Precautions: Fall Restrictions Weight Bearing Restrictions: Yes RUE Weight Bearing: Weight bearing as tolerated RLE Weight Bearing: Non weight bearing     Mobility  Bed Mobility Overal bed mobility: Needs Assistance Bed Mobility: Supine to Sit  Supine to sit: Min assist  General bed mobility comments: Min assist today with increased time. Min assist due to lethargy more so than physical limitations    Transfers Overall transfer level: Needs assistance Equipment used: Rolling walker (2 wheels) Transfers: Sit to/from Stand, Bed to chair/wheelchair/BSC Sit to Stand: Min guard Stand pivot transfers: Min guard  General transfer comment: pt was able to stand with CGA only however from elevated bed height. pt with pt's mother confirming that she has lift chair at home.    Ambulation/Gait Ambulation/Gait assistance: Min guard Gait Distance (Feet): 3 Feet Assistive device: Rolling walker (2 wheels) Gait Pattern/deviations: Step-to pattern Gait velocity: decreased     General Gait Details: Very limited standing endurance with CGA for safety. Vcs throughout for technique improvements and to maintain NWB. pt struggles with NWB     Balance Overall balance assessment: Needs assistance Sitting-balance support: No upper extremity supported Sitting balance-Leahy Scale: Good     Standing balance support: Bilateral upper extremity  supported Standing balance-Leahy Scale:  Fair       Cognition Arousal/Alertness: Awake/alert Behavior During Therapy: WFL for tasks assessed/performed Overall Cognitive Status: Within Functional Limits for tasks assessed    General Comments: Pt remains lethargic but was able to participate. Confirmed with pt's mother that pt does sleep alot at baseline and spends most of her days in recliner.           General Comments General comments (skin integrity, edema, etc.): Reviewed need for increased mobility, exercises and activity. Pt states understanding however requested not to perform more than OOB to chair transfer. pt still awaiting lunch.      Pertinent Vitals/Pain Pain Assessment Pain Assessment: No/denies pain Pain Score: 0-No pain Pain Intervention(s): Limited activity within patient's tolerance, Monitored during session, Premedicated before session, Repositioned     PT Goals (current goals can now be found in the care plan section) Acute Rehab PT Goals Patient Stated Goal: get back home with my mother and son helping me Progress towards PT goals: Progressing toward goals    Frequency    7X/week      PT Plan Current plan remains appropriate       AM-PAC PT "6 Clicks" Mobility   Outcome Measure  Help needed turning from your back to your side while in a flat bed without using bedrails?: None Help needed moving from lying on your back to sitting on the side of a flat bed without using bedrails?: A Little Help needed moving to and from a bed to a chair (including a wheelchair)?: A Little Help needed standing up from a chair using your arms (e.g., wheelchair or bedside chair)?: A Little Help needed to walk in hospital room?: A Lot Help needed climbing 3-5 steps with a railing? : Total 6 Click Score: 16    End of Session   Activity Tolerance: Patient limited by lethargy;Patient limited by fatigue Patient left: in chair;with call bell/phone within reach;with chair  alarm set Nurse Communication: Mobility status PT Visit Diagnosis: Muscle weakness (generalized) (M62.81);Difficulty in walking, not elsewhere classified (R26.2)     Time: [id number] PT Time Calculation (min) (ACUTE ONLY): 24 min  Charges:  $Therapeutic Activity: 23-37 mins                     Julaine Fusi PTA 06/03/22, 2:21 PM

## 2022-06-03 NOTE — Progress Notes (Addendum)
[facility]  ROUNDING NOTE   Subjective:   Patient seen sitting in bed Untouched breakfast tray at bedside No complaints to offer Alert with limited verbal response  Objective:  Vital signs in last 24 hours:  Temp:  [97.6 F (36.4 C)-98.8 F (37.1 C)] 97.6 F (36.4 C) (02/15 0722) Pulse Rate:  [65-78] 65 (02/15 0722) Resp:  [16-18] 16 (02/15 0722) BP: (123-141)/(77-84) 135/84 (02/15 0722) SpO2:  [99 %-100 %] 99 % (02/15 0722)  Weight change:  Filed Weights   05/22/22 1221 05/24/22 1637 05/27/22 1457  Weight: 108.1 kg 108 kg 104.3 kg    Intake/Output: I/O last 3 completed shifts: In: 1586.8 [P.O.:720; I.V.:806.8; IV Piggyback:60] Out: 1300 [Urine:1300]   Intake/Output this shift:  Total I/O In: -  Out: 150 [Urine:150]  Physical Exam: General: NAD, flat affect  Head: Normocephalic, atraumatic. Moist oral mucosal membranes  Eyes: Anicteric  Lungs:  Clear to auscultation, normal effort  Heart: Regular rate and rhythm  Abdomen:  Soft, nontender  Extremities:  No peripheral edema.  Neurologic: Alert, moving all four extremities  Skin: No lesions, TMA surgical dressing  Access: None    Basic Metabolic Panel: Recent Labs  Lab 05/31/22 1739 05/31/22 2253 06/01/22 0504 06/02/22 0418 06/03/22 0438  NA 125* 126* 124* 128* 128*  K 4.4 4.3 4.5 5.3* 4.6  CL 97* 98 99 104 103  CO2 19* 19* 17* 17* 17*  GLUCOSE 126* 108* 108* 99 95  BUN 57* 58* 61* 70* 74*  CREATININE 3.54* 3.55* 3.39* 2.75* 2.16*  CALCIUM 8.5* 8.0* 7.9* 8.1* 7.9*  PHOS  --   --   --  9.0*  --     Liver Function Tests: Recent Labs  Lab 06/02/22 0418  ALBUMIN 2.4*   No results for input(s): "LIPASE", "AMYLASE" in the last 168 hours. No results for input(s): "AMMONIA" in the last 168 hours.  CBC: Recent Labs  Lab 05/28/22 0502 05/30/22 0321 05/31/22 0420 06/02/22 0414 06/03/22 0438  WBC 9.0 8.9 7.7 6.5 6.3  HGB 10.1* 9.3* 8.9* 7.7* 8.2*  HCT 31.4* 28.2* 27.3* 23.9*  24.9*  MCV 96.3 95.9 95.1 96.8 94.7  PLT 338 325 339 280 302    Cardiac Enzymes: Recent Labs  Lab 06/01/22 0504  CKTOTAL 12*    BNP: Invalid input(s): "POCBNP"  CBG: Recent Labs  Lab 06/02/22 0815 06/02/22 1219 06/02/22 1653 06/02/22 2108 06/03/22 0726  GLUCAP 83 86 83 103* 43    Microbiology: Results for orders placed or performed during the hospital encounter of 05/21/22  Resp panel by RT-PCR (RSV, Flu A&B, Covid) Anterior Nasal Swab     Status: None   Collection Time: 05/21/22 11:39 PM   Specimen: Anterior Nasal Swab  Result Value Ref Range Status   SARS Coronavirus 2 by RT PCR NEGATIVE NEGATIVE Final    Comment: (NOTE) SARS-CoV-2 target nucleic acids are NOT DETECTED.  The SARS-CoV-2 RNA is generally detectable in upper respiratory specimens during the acute phase of infection. The lowest concentration of SARS-CoV-2 viral copies this assay can detect is 138 copies/mL. A negative result does not preclude SARS-Cov-2 infection and should not be used as the sole basis for treatment or other patient management decisions. A negative result may occur with  improper specimen collection/handling, submission of specimen other than nasopharyngeal swab, presence of viral mutation(s) within the areas targeted by this assay, and inadequate number of viral copies(<138 copies/mL). A negative result must be combined with clinical observations, patient history, and epidemiological  information. The expected result is Negative.  Fact Sheet for Patients:  EntrepreneurPulse.com.au  Fact Sheet for Healthcare Providers:  IncredibleEmployment.be  This test is no t yet approved or cleared by the [place] FDA and  has been authorized for detection and/or diagnosis of SARS-CoV-2 by FDA under an Emergency Use Authorization (EUA). This EUA will remain  in effect (meaning this test can be used) for the duration of the COVID-19 declaration under  Section 564(b)(1) of the Act, 21 U.S.C.section 360bbb-3(b)(1), unless the authorization is terminated  or revoked sooner.       Influenza A by PCR NEGATIVE NEGATIVE Final   Influenza B by PCR NEGATIVE NEGATIVE Final    Comment: (NOTE) The Xpert Xpress SARS-CoV-2/FLU/RSV plus assay is intended as an aid in the diagnosis of influenza from Nasopharyngeal swab specimens and should not be used as a sole basis for treatment. Nasal washings and aspirates are unacceptable for Xpert Xpress SARS-CoV-2/FLU/RSV testing.  Fact Sheet for Patients: EntrepreneurPulse.com.au  Fact Sheet for Healthcare Providers: IncredibleEmployment.be  This test is not yet approved or cleared by the [place] FDA and has been authorized for detection and/or diagnosis of SARS-CoV-2 by FDA under an Emergency Use Authorization (EUA). This EUA will remain in effect (meaning this test can be used) for the duration of the COVID-19 declaration under Section 564(b)(1) of the Act, 21 U.S.C. section 360bbb-3(b)(1), unless the authorization is terminated or revoked.     Resp Syncytial Virus by PCR NEGATIVE NEGATIVE Final    Comment: (NOTE) Fact Sheet for Patients: EntrepreneurPulse.com.au  Fact Sheet for Healthcare Providers: IncredibleEmployment.be  This test is not yet approved or cleared by the [place] FDA and has been authorized for detection and/or diagnosis of SARS-CoV-2 by FDA under an Emergency Use Authorization (EUA). This EUA will remain in effect (meaning this test can be used) for the duration of the COVID-19 declaration under Section 564(b)(1) of the Act, 21 U.S.C. section 360bbb-3(b)(1), unless the authorization is terminated or revoked.  Performed at [facility], [address] [place], [place] [address]   Blood Culture (routine x 2)     Status: None   Collection Time: 05/21/22 11:39 PM   Specimen: BLOOD   Result Value Ref Range Status   Specimen Description BLOOD LEFT ANTECUBITAL  Final   Special Requests   Final    BOTTLES DRAWN AEROBIC AND ANAEROBIC Blood Culture adequate volume   Culture   Final    NO GROWTH 5 DAYS Performed at [facility], [address] [place], [place] [address]    Report Status 05/26/2022 FINAL  Final  Blood Culture (routine x 2)     Status: None   Collection Time: 05/21/22 11:44 PM   Specimen: BLOOD  Result Value Ref Range Status   Specimen Description BLOOD RIGHT ANTECUBITAL  Final   Special Requests   Final    BOTTLES DRAWN AEROBIC AND ANAEROBIC Blood Culture results may not be optimal due to an excessive volume of blood received in culture bottles   Culture   Final    NO GROWTH 5 DAYS Performed at [facility], [address], [place], [place] [address]    Report Status 05/27/2022 FINAL  Final  MRSA Next Gen by PCR, Nasal     Status: None   Collection Time: 05/22/22 12:26 PM   Specimen: Nasal Mucosa; Nasal Swab  Result Value Ref Range Status   MRSA by PCR Next Gen NOT DETECTED NOT DETECTED Final  Comment: (NOTE) The GeneXpert MRSA Assay (FDA approved for NASAL specimens only), is one component of a comprehensive MRSA colonization surveillance program. It is not intended to diagnose MRSA infection nor to guide or monitor treatment for MRSA infections. Test performance is not FDA approved in patients less than 60 years old. Performed at [facility], [address] [place], [place] [address]   Aerobic/Anaerobic Culture w Gram Stain (surgical/deep wound)     Status: None   Collection Time: 05/22/22  4:38 PM   Specimen: Foot, Right; Abscess  Result Value Ref Range Status   Specimen Description   Final    FOOT Performed at [facility], [address], [place], [place] [address]    Special Requests   Final    RT FOOT ABSCESS Performed at [facility], [address] [place], [place]  [address]    Gram Stain   Final    RARE WBC PRESENT,BOTH PMN AND MONONUCLEAR RARE GRAM POSITIVE COCCI IN PAIRS IN CLUSTERS    Culture   Final    MODERATE METHICILLIN RESISTANT STAPHYLOCOCCUS AUREUS NO ANAEROBES ISOLATED Performed at [facility], [address] [place], [place] [address]    Report Status 05/27/2022 FINAL  Final   Organism ID, Bacteria METHICILLIN RESISTANT STAPHYLOCOCCUS AUREUS  Final      Susceptibility   Methicillin resistant staphylococcus aureus - MIC*    CIPROFLOXACIN <=0.5 SENSITIVE Sensitive     ERYTHROMYCIN >=8 RESISTANT Resistant     GENTAMICIN <=0.5 SENSITIVE Sensitive     OXACILLIN >=4 RESISTANT Resistant     TETRACYCLINE <=1 SENSITIVE Sensitive     VANCOMYCIN <=0.5 SENSITIVE Sensitive     TRIMETH/SULFA <=10 SENSITIVE Sensitive     CLINDAMYCIN <=0.25 SENSITIVE Sensitive     RIFAMPIN <=0.5 SENSITIVE Sensitive     Inducible Clindamycin NEGATIVE Sensitive     * MODERATE METHICILLIN RESISTANT STAPHYLOCOCCUS AUREUS  Aerobic/Anaerobic Culture w Gram Stain (surgical/deep wound)     Status: None   Collection Time: 05/27/22  3:59 PM   Specimen: PATH Other; Tissue  Result Value Ref Range Status   Specimen Description   Final    WOUND RIGHT FOOT AMPUTATION Performed at [facility], [address], [place], [place] [address]    Special Requests   Final    NONE Performed at [facility], [place], [place] [address]    Gram Stain NO WBC SEEN NO ORGANISMS SEEN   Final   Culture   Final    No growth aerobically or anaerobically. Performed at [facility], [place] [address] [place], [place] [address]    Report Status 06/01/2022 FINAL  Final   *Note: Due to a large number of results and/or encounters for the requested time period, some results have not been displayed. A complete set of results can be found in Results Review.    Coagulation Studies: No results for input(s): "LABPROT", "INR" in the last 72  hours.  Urinalysis: No results for input(s): "COLORURINE", "LABSPEC", "PHURINE", "GLUCOSEU", "HGBUR", "BILIRUBINUR", "KETONESUR", "PROTEINUR", "UROBILINOGEN", "NITRITE", "LEUKOCYTESUR" in the last 72 hours.  Invalid input(s): "APPERANCEUR"    Imaging: US RENAL  Result Date: 06/02/2022 CLINICAL DATA:  Acute kidney failure, unspecified ([place]) EXAM: RENAL / URINARY TRACT ULTRASOUND COMPLETE COMPARISON:  CT 06/30/2021. FINDINGS: Right Kidney: Renal measurements: 12.8 x 5.2 x 6.3 cm = volume: 220.3 mL. Echogenicity within normal limits. No mass or hydronephrosis visualized. Left Kidney: Renal measurements: 11.9 x 6.4  x 6.5 cm = volume: 261.4 mL. Echogenicity within normal limits. No mass or hydronephrosis visualized. Bladder: Appears normal for degree of bladder distention. Other: None. IMPRESSION: No hydronephrosis. Electronically Signed   By: Maurine Simmering M.D.   On: 06/02/2022 12:22     Medications:    sodium chloride Stopped (05/24/22 1920)   DAPTOmycin (CUBICIN) 500 mg in sodium chloride 0.9 % IVPB Stopped (06/02/22 1428)   sodium bicarbonate 150 mEq in dextrose 5 % 1,150 mL infusion 75 mL/hr at 06/03/22 0553    busPIRone  15 mg Oral TID   Chlorhexidine Gluconate Cloth  6 each Topical Q0600   Chlorhexidine Gluconate Cloth  6 each Topical Q0600   DULoxetine  20 mg Oral Daily   escitalopram  10 mg Oral Daily   estradiol  1 mg Oral QHS   insulin aspart  0-6 Units Subcutaneous TID WC   lamoTRIgine  100 mg Oral Daily   naloxegol oxalate  25 mg Oral Daily   pregabalin  50 mg Oral TID   sodium bicarbonate  50 mEq Intravenous Once   sodium chloride flush  3 mL Intravenous Q12H   sodium zirconium cyclosilicate  10 g Oral Daily   topiramate  50 mg Oral BID   acetaminophen **OR** acetaminophen, albuterol, ondansetron (ZOFRAN) IV, mouth rinse, oxyCODONE-acetaminophen  Assessment/ Plan:  [name] [name] is a 57 y.o.  female  with past medical history including GERD, hypertension, sleep  apnea, and bipolar 1 disorder, who was admitted to [facility] on 05/21/2022 for Delirium [R41.0] Hyponatremia [E87.1] Cellulitis of right foot [L03.115] Septic shock ([place]) [A41.9, R65.21] Severe sepsis ([place]) [A41.9, R65.20] AMS (altered mental status) [R41.82]   Acute kidney injury appears multifactorial. Offending agents include IV contrast, possible hypotension, and poor oral intake. Normal renal function noted on admission. IV contrast exposure on 05/22/22, kidney injury may be related to delayed nephropathy. May have had hypotension during surgical procedure.   Renal function continues to improve with IVF. Continue to encourage oral intake. Avoid nephrotoxic agents and therapies, including hypotension.   Lab Results  Component Value Date   CREATININE 2.16 (H) 06/03/2022   CREATININE 2.75 (H) 06/02/2022   CREATININE 3.39 (H) 06/01/2022    Intake/Output Summary (Last 24 hours) at 06/03/2022 1041 Last data filed at 06/03/2022 0851 Gross per 24 hour  Intake 1346.81 ml  Output 1450 ml  Net -103.19 ml   2. Acute metabolic acidosis, Serum bicarb remains 17. Continue sodium bicarb infusion.    3. Hyperkalemia/Hyponatremia, likely secondary to kidney injury. Corrected with [place]. Sodium 128, will monitor correction with iVF.     LOS: 12 [place] 2/15/202410:41 AM

## 2022-06-03 NOTE — Progress Notes (Incomplete)
Spoke to the patient on the phone and chart reviewed Date of Admission:  05/21/2022     ID: [name] [name] is a 57 y.o. female  Principal Problem:   Cellulitis of right foot Active Problems:   GERD (gastroesophageal reflux disease)   Essential hypertension   AKI (acute kidney injury) ([place])   Severe sepsis ([place])   AMS (altered mental status)   Septic shock ([place])   Abscess of right foot   Morbid obesity ([place])   Right foot infection  Pt with multiple fusion surgeries to the spine, rt reverse shoulder arthroplasty, rt TMA was admitted for altered mental status and infection at the TMA site- CT scan did no show any osteo underwent I/D on 05/22/22 MRSA in culture ( previously it had been MSSA)    Subjective: Had revision TMA last week Pt says she is feeling fine She participated in PT yesterday No nausea or vomiting or diarrhea On questioning urine output she says it is less   Medications:   busPIRone  15 mg Oral TID   Chlorhexidine Gluconate Cloth  6 each Topical Q0600   Chlorhexidine Gluconate Cloth  6 each Topical Q0600   DULoxetine  20 mg Oral Daily   escitalopram  10 mg Oral Daily   estradiol  1 mg Oral QHS   insulin aspart  0-6 Units Subcutaneous TID WC   lamoTRIgine  100 mg Oral Daily   naloxegol oxalate  25 mg Oral Daily   pregabalin  50 mg Oral TID   sodium chloride flush  3 mL Intravenous Q12H   topiramate  50 mg Oral BID    Objective: Vital signs in last 24 hours: Patient Vitals for the past 24 hrs:  BP Temp Pulse Resp SpO2  06/03/22 0722 135/84 97.6 F (36.4 C) 65 16 99 %  06/02/22 2335 123/78 97.8 F (36.6 C) 66 16 99 %  06/02/22 1656 (!) 141/77 98.8 F (37.1 C) 78 18 100 %       PHYSICAL EXAM: Not seen patient Picture reviewed   Lab Results Recent Labs    06/02/22 0414 06/02/22 0418 06/03/22 0438 06/03/22 0952  WBC 6.5  --  6.3  --   HGB 7.7*  --  8.2*  --   HCT 23.9*  --  24.9*  --   NA  --    < > 128* 128*  K  --    < > 4.6 4.3  CL   --    < > 103 101  CO2  --    < > 17* 19*  BUN  --    < > 74* 76*  CREATININE  --    < > 2.16* 2.08*   < > = values in this interval not displayed.    Microbiology: MRSA from 05/22/22 culture 05/27/22 surgical culture NG Studies/Results: Xray rt foot- no osteo  Pathology from 05/27/22 of the bone no acute osteo   Assessment/Plan: Rt TMA site infection- underwent I/D and has MRSA infection- was on vanco Went back for revision TMA on 05/27/22 culture NG and pathology no acute osteo As patient having AKI , will DC vanco As no osteo - she will not need IV antibiotic Doxy PO is oral option- but syncope /coma listed for tetracycline Bactrim cannot be used because of AKI Cipro rash Linezolid cannot be used because of multiple SSRI So possibly clinda  As patient is still therapeutic today for vancomycin can hold off  antibiotic today-  AKI- could be combination of incomplete emptying ( bladder scan 600cc) vanco/contrast-  started IV fluids  H/o multiple surgeries to spine  Depression/anxiety on meds  Discussed the management with the patient, her nurse and care team

## 2022-06-03 NOTE — Consult Note (Signed)
Pharmacy Antibiotic Note  [name] [name] is a 57 y.o. female admitted on 05/21/2022 with AMS and right foot stump pain and redness. 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 Taking PO cephalexin PTA and recent Rx for PO bactrim 05/20/22 (unsure if started). Pharmacy has been consulted for vancomycin dosing.   - vancomycin stopped 2/5 as previous culture with MSSA, current culture with MRSA so resume vancomycin. Last dose was 2/5 at 0400 - Vancomycin stopped 2/11 am with last dose being given 2/10 at 21:21 for AKI notes 2/11 am (SCr 0.69 to 1.97)  Today, 06/03/2022 Day #11 antibiotics Renal: AKI - SCr 2.16 (improving) WBC improved to WNL (no CBC today) Afebrile CK 2/13: 12 OR 2/3 for I&D and 2/5 repeat I&D (wound remains open) Does not appear to be bone involvement per podiatry 2/3 OR culture: MRSA - susc to tetracycline (allergy listed) and TMP/SMZ.  Final culture pending Returned to OR 2/8 revision of TMA and wound closure Cultures: NGTD Pathology: no evidence of acute osteomyelitis.   Vancomycin levels: Vancomycin 1572m IV q24h (dose 2/8 at a10:04), peak = 27 mcg/ml at 12:29 2/8 and random vancomycin level = 7 mcg/ml at 05:02 2/9 AUC = 352.8 w/ Cmax = 30.2 mcg/ml and Cmin = 5.4 mcg/mL 2/11 Random vancomycin level at 08:58 = 34 (last dose 2/10 at21:21) 2/12 random vancomycin level at 04:20 = 24 (last dose 2/10 at 21:21) 2/13 random vancomycin level at 05:04 = 17 (last dose 2/10) 2/14 Random vancomycin level at 04:18 = 12 (last dose 2/10)  Plan: Renal function improving and CrCl now ? 30 ml/min, can change t Daptomycin 5092m(6.61m9mg per adjusted body weight) IV to q24h - Will d/w ID prior to changing Weekly CK Follow renal function closely Oral options limited d/t drug interactions and allergies  Height: 5' 4"$  (162.6 cm) Weight: 104.3 kg (230 lb) IBW/kg (Calculated) : 54.7  Temp (24hrs), Avg:98.1 F (36.7 C), Min:97.6 F (36.4 C),  Max:98.8 F (37.1 C)  Recent Labs  Lab 05/27/22 1329 05/28/22 0502 05/28/22 0502 05/30/22 0321 05/30/22 0858 05/31/22 0420 05/31/22 1213 05/31/22 1739 05/31/22 2253 06/01/22 0504 06/02/22 0414 06/02/22 0418 06/03/22 0438  WBC  --  9.0  --  8.9  --  7.7  --   --   --   --  6.5  --  6.3  CREATININE  --  0.69   < > 1.97*  --  3.22*   < > 3.54* 3.55* 3.39*  --  2.75* 2.16*  VANCOPEAK 27*  --   --   --   --   --   --   --   --   --   --   --   --   VANCORANDOM  --  8  --   --    < > 24  --   --   --  17  --  12  --    < > = values in this interval not displayed.     Estimated Creatinine Clearance: 33.4 mL/min (A) (by C-G formula based on SCr of 2.16 mg/dL (H)).    Allergies  Allergen Reactions   Other    Tetracyclines & Related Other (See Comments)    Syncope and put her "in a coma"   Tramadol Other (See Comments)    Seizures   Phenazopyridine Other (See Comments)    Unknown   Tetracycline Other (See Comments)    Syncope and "Put me in  a coma"   Ciprofloxacin Rash and Itching   Codeine Itching and Rash   Estradiol Rash    Patches broke out the skin    Antimicrobials this admission: 2/5 Unasyn >> 2/7 2/3 Vancomycin >> 2/14 2/3 Meropenem >>2/5 2/3 Ceftriaxone x 1 dose  2/14 daptomycin >>  Dose adjustments this admission:   Microbiology results: 2/2 BCx: NGTD 2/3: R foot abscess cx: mod MRSA 2/3 MRSA PCR neg 2/8 Foot amputation: NGTD  Thank you for allowing pharmacy to be a part of this patient's care.  Doreene Eland, PharmD, BCPS, BCIDP Work Cell: [phone] 06/03/2022 10:09 AM

## 2022-06-03 NOTE — Progress Notes (Signed)
. 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  02/02: to ED as above. Septic shock, required brief levo gtt. 02/03: admitted to hospitalist service, underwent I&D w/ podiatry 02/04: stable on IV abx awaiting Cx 02/05: back to OR for revision I&D.   02/06-02/07: stable, planning back to OR 02/08. Cultures from 02/03 (+)MRSA 02/08: to OR for revision of RLE TMA. Per ID, continue vanco and send bone for pathology and culture  02/09: podiatry and ID recs for continue inpatient for now to ensure surgical stabilization and await culture results to guide abx, will likely be here thru the weekend (today is Friday)   02/10-02/11: stable, awaiting cultures 02/12: Surgical pathology resulted: no osteomyelitis. Cr jumped up today, pt reports no urine yesterday. IV fluids. UA no infection, FeNa prerenal. ID and pharmacy following and adjusting abx and other possible nephrotoxic medications. 600 mL UOP today, Cr worsening this afternoon, will give additional 1L fluids, nephrology consulted.  02/13: minimal improvement Cr, worsening hyponatremia, nephrology recs pending. 02/14: Continues to complain about inability to void.  Bladder scan done at noon had 541 cc of urine.  Serum creatinine peaked at 3.56 and today it is 2.75.  Will place Foley catheter 2/15  : Patient seen and examined at bedside lethargic in bed.  Continues to stay on daptomycin pending ID recommendations for further continuation of medication.     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 Surgical path no osteomyelitis   Podiatry ok for po abx (doxycycline), podiatry team has signed off 02/12. Follow-up in 1 to 2 weeks with Dr. Amalia Hailey  Wound culture yielded MRSA.  Patient has been on IV vancomycin and last dose was on 05/29/22 Started on daptomycin on 06/02/22.  Duration of antibiotic therapy to be determined by infectious disease   AKI with hyperkalemia and hyponatremia Sharp increase in Cr.  AKI is thought to be multifactorial and related to IV contrast and possible hypotension during surgical procedure as well as poor oral intake. Serum creatinine peaked at 3.5 6-2.75-2.16-2.08(today)  Decrease po intake per RN and patient Decreased UOP per RN and patient  Prerenal FeNa Continue IV fluid hydration.  Improvement in serum sodium levels with hydration Follow BMP closely nephrology consulted, appreciate recs  Start patient on Lokelma for hyperkalemia Will discuss with ID and discontinue vancomycin Avoid nephrotoxic agents   Depression anxiety bipolar disorder history of seizure disorder cont Buspar, Cymbalta and Lexapro  cont lamotrigine and Topamax   Chronic pain syndrome recent right shoulder arthroplasty cont home Percocet and Lyrica -  dose reduced d/t renal    Morbid obesity, BMI [id number] Complicates treatment and prognosis   Sleep apnea CPAP nightly  Anemia (acute on chronic) Patient with baseline hemoglobin of 10 which has dropped to 7.7 No obvious source of  bleeding at this time Obtain iron studies Hold aspirin and Lovenox      Physical Exam: Vitals:   06/02/22 0815 06/02/22 1656 06/02/22 2335 06/03/22 0722  BP: (!) 149/89 (!) 141/77 123/78 135/84  Pulse: 67 78 66 65  Resp: 18 18 16 16  $ Temp: (!) 97.5 F (36.4 C) 98.8 F (37.1 C) 97.8 F (36.6 C) 97.6 F (36.4 C)  TempSrc:      SpO2: 99% 100% 99% 99%  Weight:      Height:       Physical Exam Vitals and nursing note reviewed.  Constitutional:      Appearance: She is obese.  HENT:     Head: Normocephalic.     Nose: Nose normal.     Mouth/Throat:     Mouth: Mucous membranes are moist.  Eyes:     Conjunctiva/sclera: Conjunctivae normal.  Cardiovascular:     Rate and Rhythm: Normal rate and regular rhythm.  Pulmonary:     Effort: Pulmonary effort is normal.     Breath sounds: Normal breath sounds.  Abdominal:     General: Bowel sounds are normal.     Palpations: Abdomen is soft.     Comments: Central adiposity  Musculoskeletal:        General: Normal range of motion.     Cervical back: Normal range of motion and neck supple.     Comments: Dressing over right TMA stump  Skin:    General: Skin is warm and dry.  Neurological:     Mental Status: She is alert and oriented to person, place, and time.     Motor: Weakness present.  Psychiatric:     Comments: Flat affect     Data Reviewed: Labs reviewed. Hemoglobin 7.7, sodium 128 up from 124, potassium 5.3, bicarbonate 17, serum creatinine 2.75, BUN 70 There are no new results to review at this time.  Family Communication: Discussed plan of care with patient at the bedside.  She verbalizes understanding and agrees with  Disposition: Status is: Inpatient Remains inpatient appropriate because: Continues to require IV antibiotics for right TMA stump infection  Planned Discharge Destination: Home with Home Health    Time spent: 35  minutes  Author: Oran Rein, MD 06/03/2022 2:38 PM  For on call review  [email or web address].

## 2022-06-04 DIAGNOSIS — N179 Acute kidney failure, unspecified: Secondary | ICD-10-CM | POA: Diagnosis not present

## 2022-06-04 DIAGNOSIS — L089 Local infection of the skin and subcutaneous tissue, unspecified: Secondary | ICD-10-CM | POA: Diagnosis not present

## 2022-06-04 LAB — BASIC METABOLIC PANEL
Anion gap: 8 (ref 5–15)
BUN: 78 mg/dL — ABNORMAL HIGH (ref 6–20)
CO2: 21 mmol/L — ABNORMAL LOW (ref 22–32)
Calcium: 7.5 mg/dL — ABNORMAL LOW (ref 8.9–10.3)
Chloride: 96 mmol/L — ABNORMAL LOW (ref 98–111)
Creatinine, Ser: 1.7 mg/dL — ABNORMAL HIGH (ref 0.44–1.00)
GFR, Estimated: 35 mL/min — ABNORMAL LOW (ref >60)
Glucose, Bld: 118 mg/dL — ABNORMAL HIGH (ref 70–99)
Potassium: 4.1 mmol/L (ref 3.5–5.1)
Sodium: 125 mmol/L — ABNORMAL LOW (ref 135–145)

## 2022-06-04 LAB — PROTEIN ELECTROPHORESIS, SERUM
A/G Ratio: 0.8 (ref 0.7–1.7)
Albumin ELP: 2.5 g/dL — ABNORMAL LOW (ref 2.9–4.4)
Alpha-1-Globulin: 0.4 g/dL (ref 0.0–0.4)
Alpha-2-Globulin: 0.8 g/dL (ref 0.4–1.0)
Beta Globulin: 0.8 g/dL (ref 0.7–1.3)
Gamma Globulin: 1.2 g/dL (ref 0.4–1.8)
Globulin, Total: 3.2 g/dL (ref 2.2–3.9)
Total Protein ELP: 5.7 g/dL — ABNORMAL LOW (ref 6.0–8.5)

## 2022-06-04 LAB — GLUCOSE, CAPILLARY
Glucose-Capillary: 108 mg/dL — ABNORMAL HIGH (ref 70–99)
Glucose-Capillary: 129 mg/dL — ABNORMAL HIGH (ref 70–99)
Glucose-Capillary: 94 mg/dL (ref 70–99)
Glucose-Capillary: 120 mg/dL — ABNORMAL HIGH (ref 70–99)

## 2022-06-04 MED ORDER — SODIUM CHLORIDE 0.9 % IV SOLN: INTRAVENOUS | Status: DC

## 2022-06-04 NOTE — TOC Progression Note (Incomplete)
Transition of Care [facility]) - Progression Note    Patient Details  Name: [name] [name] MRN: [id number] Date of Birth: [birth year]  Transition of Care [facility]) CM/SW [place], RN Phone Number: 06/04/2022, 11:05 AM  Clinical Narrative:     Reached out to the ID Pharmacists and the   Expected Discharge Plan: [place] Barriers to Discharge: Continued Medical Work up  Expected Discharge Plan and Services   Discharge Planning Services: CM Consult Post Acute Care Choice: [place] arrangements for the past 2 months: Single Family Home                                       Social Determinants of Health (SDOH) Interventions SDOH Screenings   Food Insecurity: No Food Insecurity (05/22/2022)  Housing: Low Risk  (05/22/2022)  Transportation Needs: No Transportation Needs (05/22/2022)  Utilities: Not At Risk (05/22/2022)  Alcohol Screen: Low Risk  (08/21/2020)  Depression (PHQ2-9): Low Risk  (09/18/2021)  Financial Resource Strain: Low Risk  (08/21/2020)  Physical Activity: Inactive (08/21/2020)  Social Connections: Socially Isolated (08/21/2020)  Stress: No Stress Concern Present (08/21/2020)  Tobacco Use: Low Risk  (05/28/2022)    Readmission Risk Interventions     No data to display

## 2022-06-04 NOTE — Plan of Care (Signed)
  Problem: Pain Managment: Goal: General experience of comfort will improve Outcome: Progressing   Problem: Safety: Goal: Ability to remain free from injury will improve Outcome: Progressing   Problem: Skin Integrity: Goal: Risk for impaired skin integrity will decrease Outcome: Progressing   Problem: Education: Goal: Ability to describe self-care measures that may prevent or decrease complications (Diabetes Survival Skills Education) will improve Outcome: Progressing   Problem: Clinical Measurements: Goal: Ability to maintain clinical measurements within normal limits will improve Outcome: Progressing

## 2022-06-04 NOTE — Progress Notes (Signed)
[facility]  ROUNDING NOTE   Subjective:   Patient more interactive this morning Pain controlled with prescribed medications Appetite remains poor  Creatinine improving Good UOP  Objective:  Vital signs in last 24 hours:  Temp:  [97.6 F (36.4 C)-97.9 F (36.6 C)] 97.9 F (36.6 C) (02/16 0748) Pulse Rate:  [68-80] 68 (02/16 0748) Resp:  [16-18] 16 (02/16 0748) BP: (108-124)/(60-66) 108/60 (02/16 0748) SpO2:  [100 %] 100 % (02/16 0748)  Weight change:  Filed Weights   05/22/22 1221 05/24/22 1637 05/27/22 1457  Weight: 108.1 kg 108 kg 104.3 kg    Intake/Output: I/O last 3 completed shifts: In: 1931.2 [I.V.:1871.2; IV Piggyback:60] Out: 2000 [Urine:2000]   Intake/Output this shift:  Total I/O In: -  Out: 400 [Urine:400]  Physical Exam: General: NAD, flat affect  Head: Normocephalic, atraumatic. Moist oral mucosal membranes  Eyes: Anicteric  Lungs:  Clear to auscultation, normal effort  Heart: Regular rate and rhythm  Abdomen:  Soft, nontender  Extremities:  No peripheral edema.  Neurologic: Alert, moving all four extremities  Skin: No lesions, TMA surgical dressing  Access: None    Basic Metabolic Panel: Recent Labs  Lab 06/01/22 0504 06/02/22 0418 06/03/22 0438 06/03/22 0952 06/04/22 0157  NA 124* 128* 128* 128* 125*  K 4.5 5.3* 4.6 4.3 4.1  CL 99 104 103 101 96*  CO2 17* 17* 17* 19* 21*  GLUCOSE 108* 99 95 97 118*  BUN 61* 70* 74* 76* 78*  CREATININE 3.39* 2.75* 2.16* 2.08* 1.70*  CALCIUM 7.9* 8.1* 7.9* 8.0* 7.5*  PHOS  --  9.0*  --   --   --      Liver Function Tests: Recent Labs  Lab 06/02/22 0418  ALBUMIN 2.4*    No results for input(s): "LIPASE", "AMYLASE" in the last 168 hours. No results for input(s): "AMMONIA" in the last 168 hours.  CBC: Recent Labs  Lab 05/30/22 0321 05/31/22 0420 06/02/22 0414 06/03/22 0438  WBC 8.9 7.7 6.5 6.3  HGB 9.3* 8.9* 7.7* 8.2*  HCT 28.2* 27.3* 23.9* 24.9*  MCV 95.9 95.1 96.8  94.7  PLT 325 339 280 302     Cardiac Enzymes: Recent Labs  Lab 06/01/22 0504  CKTOTAL 12*     BNP: Invalid input(s): "POCBNP"  CBG: Recent Labs  Lab 06/03/22 0726 06/03/22 1139 06/03/22 1648 06/03/22 2134 06/04/22 0743  GLUCAP 87 94 123* 103* 108*     Microbiology: Results for orders placed or performed during the hospital encounter of 05/21/22  Resp panel by RT-PCR (RSV, Flu A&B, Covid) Anterior Nasal Swab     Status: None   Collection Time: 05/21/22 11:39 PM   Specimen: Anterior Nasal Swab  Result Value Ref Range Status   SARS Coronavirus 2 by RT PCR NEGATIVE NEGATIVE Final    Comment: (NOTE) SARS-CoV-2 target nucleic acids are NOT DETECTED.  The SARS-CoV-2 RNA is generally detectable in upper respiratory specimens during the acute phase of infection. The lowest concentration of SARS-CoV-2 viral copies this assay can detect is 138 copies/mL. A negative result does not preclude SARS-Cov-2 infection and should not be used as the sole basis for treatment or other patient management decisions. A negative result may occur with  improper specimen collection/handling, submission of specimen other than nasopharyngeal swab, presence of viral mutation(s) within the areas targeted by this assay, and inadequate number of viral copies(<138 copies/mL). A negative result must be combined with clinical observations, patient history, and epidemiological information. The expected result is  Negative.  Fact Sheet for Patients:  EntrepreneurPulse.com.au  Fact Sheet for Healthcare Providers:  IncredibleEmployment.be  This test is no t yet approved or cleared by the [place] FDA and  has been authorized for detection and/or diagnosis of SARS-CoV-2 by FDA under an Emergency Use Authorization (EUA). This EUA will remain  in effect (meaning this test can be used) for the duration of the COVID-19 declaration under Section 564(b)(1) of the  Act, 21 U.S.C.section 360bbb-3(b)(1), unless the authorization is terminated  or revoked sooner.       Influenza A by PCR NEGATIVE NEGATIVE Final   Influenza B by PCR NEGATIVE NEGATIVE Final    Comment: (NOTE) The Xpert Xpress SARS-CoV-2/FLU/RSV plus assay is intended as an aid in the diagnosis of influenza from Nasopharyngeal swab specimens and should not be used as a sole basis for treatment. Nasal washings and aspirates are unacceptable for Xpert Xpress SARS-CoV-2/FLU/RSV testing.  Fact Sheet for Patients: EntrepreneurPulse.com.au  Fact Sheet for Healthcare Providers: IncredibleEmployment.be  This test is not yet approved or cleared by the [place] FDA and has been authorized for detection and/or diagnosis of SARS-CoV-2 by FDA under an Emergency Use Authorization (EUA). This EUA will remain in effect (meaning this test can be used) for the duration of the COVID-19 declaration under Section 564(b)(1) of the Act, 21 U.S.C. section 360bbb-3(b)(1), unless the authorization is terminated or revoked.     Resp Syncytial Virus by PCR NEGATIVE NEGATIVE Final    Comment: (NOTE) Fact Sheet for Patients: EntrepreneurPulse.com.au  Fact Sheet for Healthcare Providers: IncredibleEmployment.be  This test is not yet approved or cleared by the [place] FDA and has been authorized for detection and/or diagnosis of SARS-CoV-2 by FDA under an Emergency Use Authorization (EUA). This EUA will remain in effect (meaning this test can be used) for the duration of the COVID-19 declaration under Section 564(b)(1) of the Act, 21 U.S.C. section 360bbb-3(b)(1), unless the authorization is terminated or revoked.  Performed at [facility], [address] [place], [place] [address]   Blood Culture (routine x 2)     Status: None   Collection Time: 05/21/22 11:39 PM   Specimen: BLOOD  Result Value Ref Range  Status   Specimen Description BLOOD LEFT ANTECUBITAL  Final   Special Requests   Final    BOTTLES DRAWN AEROBIC AND ANAEROBIC Blood Culture adequate volume   Culture   Final    NO GROWTH 5 DAYS Performed at [facility], [address] [place], [place] [address]    Report Status 05/26/2022 FINAL  Final  Blood Culture (routine x 2)     Status: None   Collection Time: 05/21/22 11:44 PM   Specimen: BLOOD  Result Value Ref Range Status   Specimen Description BLOOD RIGHT ANTECUBITAL  Final   Special Requests   Final    BOTTLES DRAWN AEROBIC AND ANAEROBIC Blood Culture results may not be optimal due to an excessive volume of blood received in culture bottles   Culture   Final    NO GROWTH 5 DAYS Performed at [facility], [address], [place], [place] [address]    Report Status 05/27/2022 FINAL  Final  MRSA Next Gen by PCR, Nasal     Status: None   Collection Time: 05/22/22 12:26 PM   Specimen: Nasal Mucosa; Nasal Swab  Result Value Ref Range Status   MRSA by PCR Next Gen NOT DETECTED NOT DETECTED Final    Comment: (NOTE) The GeneXpert MRSA  Assay (FDA approved for NASAL specimens only), is one component of a comprehensive MRSA colonization surveillance program. It is not intended to diagnose MRSA infection nor to guide or monitor treatment for MRSA infections. Test performance is not FDA approved in patients less than 64 years old. Performed at [facility], [address] [place], [place] [address]   Aerobic/Anaerobic Culture w Gram Stain (surgical/deep wound)     Status: None   Collection Time: 05/22/22  4:38 PM   Specimen: Foot, Right; Abscess  Result Value Ref Range Status   Specimen Description   Final    FOOT Performed at [facility], [address] [place], [place] [address]    Special Requests   Final    RT FOOT ABSCESS Performed at [facility], [address] [place], [place] [address]    Gram Stain   Final     RARE WBC PRESENT,BOTH PMN AND MONONUCLEAR RARE GRAM POSITIVE COCCI IN PAIRS IN CLUSTERS    Culture   Final    MODERATE METHICILLIN RESISTANT STAPHYLOCOCCUS AUREUS NO ANAEROBES ISOLATED Performed at [facility], [address] [place], [place] [address]    Report Status 05/27/2022 FINAL  Final   Organism ID, Bacteria METHICILLIN RESISTANT STAPHYLOCOCCUS AUREUS  Final      Susceptibility   Methicillin resistant staphylococcus aureus - MIC*    CIPROFLOXACIN <=0.5 SENSITIVE Sensitive     ERYTHROMYCIN >=8 RESISTANT Resistant     GENTAMICIN <=0.5 SENSITIVE Sensitive     OXACILLIN >=4 RESISTANT Resistant     TETRACYCLINE <=1 SENSITIVE Sensitive     VANCOMYCIN <=0.5 SENSITIVE Sensitive     TRIMETH/SULFA <=10 SENSITIVE Sensitive     CLINDAMYCIN <=0.25 SENSITIVE Sensitive     RIFAMPIN <=0.5 SENSITIVE Sensitive     Inducible Clindamycin NEGATIVE Sensitive     * MODERATE METHICILLIN RESISTANT STAPHYLOCOCCUS AUREUS  Aerobic/Anaerobic Culture w Gram Stain (surgical/deep wound)     Status: None   Collection Time: 05/27/22  3:59 PM   Specimen: PATH Other; Tissue  Result Value Ref Range Status   Specimen Description   Final    WOUND RIGHT FOOT AMPUTATION Performed at [facility], [address] [place], [place] [address]    Special Requests   Final    NONE Performed at [facility], [place], [place] [address]    Gram Stain NO WBC SEEN NO ORGANISMS SEEN   Final   Culture   Final    No growth aerobically or anaerobically. Performed at [facility], [place] [address], [place], [place] [address]    Report Status 06/01/2022 FINAL  Final   *Note: Due to a large number of results and/or encounters for the requested time period, some results have not been displayed. A complete set of results can be found in Results Review.    Coagulation Studies: No results for input(s): "LABPROT", "INR" in the last 72 hours.  Urinalysis: No results for  input(s): "COLORURINE", "LABSPEC", "PHURINE", "GLUCOSEU", "HGBUR", "BILIRUBINUR", "KETONESUR", "PROTEINUR", "UROBILINOGEN", "NITRITE", "LEUKOCYTESUR" in the last 72 hours.  Invalid input(s): "APPERANCEUR"    Imaging: US RENAL  Result Date: 06/02/2022 CLINICAL DATA:  Acute kidney failure, unspecified ([place]) EXAM: RENAL / URINARY TRACT ULTRASOUND COMPLETE COMPARISON:  CT 06/30/2021. FINDINGS: Right Kidney: Renal measurements: 12.8 x 5.2 x 6.3 cm = volume: 220.3 mL. Echogenicity within normal limits. No mass or hydronephrosis visualized. Left Kidney: Renal measurements: 11.9 x 6.4 x 6.5 cm = volume:  261.4 mL. Echogenicity within normal limits. No mass or hydronephrosis visualized. Bladder: Appears normal for degree of bladder distention. Other: None. IMPRESSION: No hydronephrosis. Electronically Signed   By: Maurine Simmering M.D.   On: 06/02/2022 12:22     Medications:    sodium chloride Stopped (05/24/22 1920)   DAPTOmycin (CUBICIN) 500 mg in sodium chloride 0.9 % IVPB 500 mg (06/03/22 1804)   sodium bicarbonate 150 mEq in dextrose 5 % 1,150 mL infusion 75 mL/hr at 06/03/22 2317    busPIRone  15 mg Oral TID   Chlorhexidine Gluconate Cloth  6 each Topical Q0600   Chlorhexidine Gluconate Cloth  6 each Topical Q0600   DULoxetine  20 mg Oral Daily   escitalopram  10 mg Oral Daily   estradiol  1 mg Oral QHS   insulin aspart  0-6 Units Subcutaneous TID WC   lamoTRIgine  100 mg Oral Daily   naloxegol oxalate  25 mg Oral Daily   pregabalin  50 mg Oral TID   sodium chloride flush  3 mL Intravenous Q12H   topiramate  50 mg Oral BID   acetaminophen **OR** acetaminophen, albuterol, ondansetron (ZOFRAN) IV, mouth rinse, oxyCODONE-acetaminophen  Assessment/ Plan:  [name] is a 57 y.o.  female  with past medical history including GERD, hypertension, sleep apnea, and bipolar 1 disorder, who was admitted to [facility] on 05/21/2022 for Delirium [R41.0] Hyponatremia [E87.1] Cellulitis of right foot  [L03.115] Septic shock ([place]) [A41.9, R65.21] Severe sepsis ([place]) [A41.9, R65.20] AMS (altered mental status) [R41.82]   Acute kidney injury appears multifactorial. Offending agents include IV contrast, possible hypotension, and poor oral intake. Normal renal function noted on admission. IV contrast exposure on 05/22/22, kidney injury may be related to delayed nephropathy. May have had hypotension during surgical procedure.   Creatinine continues to improve with IV hydration. Adequate urine output noted. No indication for dialysis.   Lab Results  Component Value Date   CREATININE 1.70 (H) 06/04/2022   CREATININE 2.08 (H) 06/03/2022   CREATININE 2.16 (H) 06/03/2022    Intake/Output Summary (Last 24 hours) at 06/04/2022 0953 Last data filed at 06/04/2022 0912 Gross per 24 hour  Intake 1064.42 ml  Output 1800 ml  Net -735.58 ml    2. Acute metabolic acidosis, Serum bicarb increased to 21 today. Correction achieved with sodium bicarb infusion.    3. Hyperkalemia/Hyponatremia, likely secondary to kidney injury. Potassium corrected. Sodium continues to decrease. Patient encouraged to increase oral intake.     LOS: [place] 2/16/20249:53 AM

## 2022-06-04 NOTE — TOC Progression Note (Signed)
Transition of Care [facility]) - Progression Note    Patient Details  Name: [name] [name] MRN: [id number] Date of Birth: 15-Dec-1965  Transition of Care [facility]) CM/SW [place], RN Phone Number: 06/04/2022, 1:48 PM  Clinical Narrative:     TOC continues to follow,  waiting for renal function to improve. oral options extremely limited d/t allergie and drug interactions.     Expected Discharge Plan: [place] Barriers to Discharge: Continued Medical Work up  Expected Discharge Plan and Services   Discharge Planning Services: CM Consult Post Acute Care Choice: [place] arrangements for the past 2 months: Single Family Home                                       Social Determinants of Health (SDOH) Interventions SDOH Screenings   Food Insecurity: No Food Insecurity (05/22/2022)  Housing: Low Risk  (05/22/2022)  Transportation Needs: No Transportation Needs (05/22/2022)  Utilities: Not At Risk (05/22/2022)  Alcohol Screen: Low Risk  (08/21/2020)  Depression (PHQ2-9): Low Risk  (09/18/2021)  Financial Resource Strain: Low Risk  (08/21/2020)  Physical Activity: Inactive (08/21/2020)  Social Connections: Socially Isolated (08/21/2020)  Stress: No Stress Concern Present (08/21/2020)  Tobacco Use: Low Risk  (05/28/2022)    Readmission Risk Interventions     No data to display

## 2022-06-04 NOTE — Care Management Important Message (Signed)
Important Message  Patient Details  Name: [name] [name] MRN: [id number] Date of Birth: June 11, 1965   Medicare Important Message Given:  Yes     [name] 06/04/2022, 10:44 AM

## 2022-06-04 NOTE — Progress Notes (Signed)
PT Cancellation Note  Patient Details Name: [name] [name] MRN: [id number] DOB: Jul 16, 1965   Cancelled Treatment:     PT attempt. Pt holding emesis bag upon arrival. Untouched lunch tray at bedside. Lengthy discussion about importance of eating due to medications. Pt states understanding. Encouraged pt to get OOB however," I can't today. I don't feel good. I need a day off."     Willette Pa 06/04/2022, 2:52 PM

## 2022-06-04 NOTE — Plan of Care (Signed)
  Problem: Activity: Goal: Risk for activity intolerance will decrease Outcome: Progressing   Problem: Nutrition: Goal: Adequate nutrition will be maintained Outcome: Progressing   Problem: Elimination: Goal: Will not experience complications related to bowel motility Outcome: Progressing Goal: Will not experience complications related to urinary retention Outcome: Progressing   Problem: Safety: Goal: Ability to remain free from injury will improve Outcome: Progressing   Problem: Skin Integrity: Goal: Risk for impaired skin integrity will decrease Outcome: Progressing   Problem: Fluid Volume: Goal: Ability to maintain a balanced intake and output will improve Outcome: Progressing   Problem: Metabolic: Goal: Ability to maintain appropriate glucose levels will improve Outcome: Progressing

## 2022-06-04 NOTE — Progress Notes (Signed)
Date of Admission:  05/21/2022     ID: [name] [name] is a 57 y.o. female  Principal Problem:   Cellulitis of right foot Active Problems:   GERD (gastroesophageal reflux disease)   Essential hypertension   AKI (acute kidney injury) ([place])   Severe sepsis ([name])   AMS (altered mental status)   Septic shock ([name])   Abscess of right foot   Morbid obesity ([place])   Right foot infection  Pt with multiple fusion surgeries to the spine, rt reverse shoulder arthroplasty, rt TMA was admitted for altered mental status and infection at the TMA site- CT scan did no show any osteo underwent I/D on 05/22/22 MRSA in culture ( previously it had been MSSA)    Subjective: Says she is not feeling well Nauseous No pain abdomen or diarrhea    Medications:   busPIRone  15 mg Oral TID   Chlorhexidine Gluconate Cloth  6 each Topical Q0600   Chlorhexidine Gluconate Cloth  6 each Topical Q0600   DULoxetine  20 mg Oral Daily   escitalopram  10 mg Oral Daily   estradiol  1 mg Oral QHS   insulin aspart  0-6 Units Subcutaneous TID WC   lamoTRIgine  100 mg Oral Daily   naloxegol oxalate  25 mg Oral Daily   pregabalin  50 mg Oral TID   sodium chloride flush  3 mL Intravenous Q12H   topiramate  50 mg Oral BID    Objective: Vital signs in last 24 hours: Patient Vitals for the past 24 hrs:  BP Temp Pulse Resp SpO2  06/04/22 0748 108/60 97.9 F (36.6 C) 68 16 100 %  06/03/22 2332 124/66 97.7 F (36.5 C) 71 18 100 %  06/03/22 1600 122/64 97.6 F (36.4 C) 80 16 100 %       PHYSICAL EXAM:  Lethargic- oriented X5 Chest B/l air entry [id number] Abd soft Rt foot TMA site is well coapted- no erythema or discharge        Lab Results Recent Labs    06/02/22 0414 06/02/22 0418 06/03/22 0438 06/03/22 0952 06/04/22 0157  WBC 6.5  --  6.3  --   --   HGB 7.7*  --  8.2*  --   --   HCT 23.9*  --  24.9*  --   --   NA  --    < > 128* 128* 125*  K  --    < > 4.6 4.3 4.1  CL  --    < > 103 101  96*  CO2  --    < > 17* 19* 21*  BUN  --    < > 74* 76* 78*  CREATININE  --    < > 2.16* 2.08* 1.70*   < > = values in this interval not displayed.    Microbiology: MRSA from 05/22/22 culture 05/27/22 surgical culture NG Studies/Results: Xray rt foot- no osteo  Pathology from 05/27/22 of the bone no acute osteo   Assessment/Plan: Rt TMA site infection- underwent I/D and has MRSA infection- was on vanco Went back for revision TMA on 05/27/22 culture NG and pathology no acute osteo As patient was having AKI ,DC vanco As no osteo - she will not need IV antibiotic Doxy PO is oral option- but syncope /coma listed for tetracycline Bactrim cannot be used because of AKI Cipro rash Linezolid cannot be used because of multiple SSRI Pt currently on daptomycin day 9 post surgery- will give  dapto 10-14 days while in hopsoital- on discharge she will not need any antibiotics   AKI- could be combination of incomplete emptying ( bladder scan 600cc) vanco/contrast-  started IV fluids  H/o multiple surgeries to spine  Depression/anxiety on meds  Discussed the management with the patient, and hospitalist

## 2022-06-04 NOTE — Progress Notes (Signed)
.. Progress Note   Patient: [name] [name] [id number] DOB: June 11, 1965 DOA: 05/21/2022     13 DOS: the patient was seen and examined on 06/04/2022   Brief hospital course:  [name] [name] is 57 year old female with history of bipolar disorder, total arthroplasty right shoulder December 2023, chronic pain syndrome, anxiety depression, who presented to emergency room 05/21/22 with altered mental status and redness of the right foot amputation stump, was on Keflex at home, TMA 2 mos prior.  02/02: to ED as above. Septic shock, required brief levo gtt. 02/03: admitted to hospitalist service, underwent I&D w/ podiatry 02/04: stable on IV abx awaiting Cx 02/05: back to OR for revision I&D.   02/06-02/07: stable, planning back to OR 02/08. Cultures from 02/03 (+)MRSA 02/08: to OR for revision of RLE TMA. Per ID, continue vanco and send bone for pathology and culture  02/09: podiatry and ID recs for continue inpatient for now to ensure surgical stabilization and await culture results to guide abx, will likely be here thru the weekend (today is Friday)   02/10-02/11: stable, awaiting cultures 02/12: Surgical pathology resulted: no osteomyelitis. Cr jumped up today, pt reports no urine yesterday. IV fluids. UA no infection, FeNa prerenal. ID and pharmacy following and adjusting abx and other possible nephrotoxic medications. 600 mL UOP today, Cr worsening this afternoon, will give additional 1L fluids, nephrology consulted.  02/13: minimal improvement Cr, worsening hyponatremia, nephrology recs pending. 02/14: Continues to complain about inability to void.  Bladder scan done at noon had 541 cc of urine.  Serum creatinine peaked at 3.56 and today it is 2.75.  Will place Foley catheter 2/15  : Patient seen and examined at bedside lethargic in bed.  Continues to stay on daptomycin pending ID recommendations for further continuation of medication.  2/16 : Patient seen and examined this morning complaining of  not feeling well.  No acute overnight events.  Vital stable.  Labs with persistent hyponatremia with sodium of 125.  Renal function with improvement creatinine 1.7.  Patient continued on daptomycin with last dose on 2/18 as per infectious disease   Assessment and Plan:  Principal Problem:   Cellulitis of right foot Active Problems:   Severe sepsis ([place])   GERD (gastroesophageal reflux disease)   Essential hypertension   AMS (altered mental status)   Septic shock ([name])   Abscess of right foot   Septic shock - POA - resolved  Right foot cellulitis and abscess at site of prior TMA with history of prior ulceration (+)MRSA S/p return to the OR on 05/27/2022 for repeat washout, revisional amputation and resection of necrotic bone and soft tissue and primary closure of the amputation site. Surgical path no osteomyelitis   Podiatry ok for po abx (doxycycline), podiatry team has signed off 02/12. Follow-up in 1 to 2 weeks with Dr. Amalia Hailey  Wound culture yielded MRSA.  Patient has been on IV vancomycin and last dose was on 05/29/22 Started on daptomycin on 06/02/22 last dose on 2/18.   AKI with hyperkalemia and hyponatremia Sharp increase in Cr.  AKI is thought to be multifactorial and related to IV contrast and possible hypotension during surgical procedure as well as poor oral intake. Serum creatinine peaked at 3.5 6-2.75-2.16-2.08(today)-1.7  Decrease po intake per RN and patient Decreased UOP per RN and patient  Prerenal FeNa Continue IV fluid hydration.  Improvement in serum sodium levels with hydration Follow BMP closely nephrology consulted, appreciate recs  Start patient on [place] for hyperkalemia-has been stable  no indication for Lokelma now Avoid nephrotoxic agents   Depression anxiety bipolar disorder history of seizure disorder cont Buspar, Cymbalta and Lexapro  cont lamotrigine and Topamax   Chronic pain syndrome recent right shoulder arthroplasty cont home Percocet and  Lyrica - dose reduced d/t renal    Morbid obesity, BMI 99991111 Complicates treatment and prognosis   Sleep apnea CPAP nightly  Anemia (acute on chronic) Patient with baseline hemoglobin of 10 which has dropped to 7.7 No obvious source of bleeding at this time Obtain iron studies Hold aspirin and Lovenox      Physical Exam: Vitals:   06/03/22 0722 06/03/22 1600 06/03/22 2332 06/04/22 0748  BP: 135/84 122/64 124/66 108/60  Pulse: 65 80 71 68  Resp: 16 16 18 16  $ Temp: 97.6 F (36.4 C) 97.6 F (36.4 C) 97.7 F (36.5 C) 97.9 F (36.6 C)  TempSrc:      SpO2: 99% 100% 100% 100%  Weight:      Height:       Physical Exam Vitals and nursing note reviewed.  Constitutional:      Appearance: She is obese.     Comments: Lethargic with nausea   HENT:     Head: Normocephalic.     Nose: Nose normal.     Mouth/Throat:     Mouth: Mucous membranes are moist.  Eyes:     Conjunctiva/sclera: Conjunctivae normal.  Cardiovascular:     Rate and Rhythm: Normal rate and regular rhythm.  Pulmonary:     Effort: Pulmonary effort is normal.     Breath sounds: Normal breath sounds.  Abdominal:     General: Bowel sounds are normal.     Palpations: Abdomen is soft.     Comments: Central adiposity  Musculoskeletal:        General: Normal range of motion.     Cervical back: Normal range of motion and neck supple.     Comments: Dressing over right TMA stump  Skin:    General: Skin is warm and dry.  Neurological:     Mental Status: She is alert and oriented to person, place, and time.     Motor: Weakness present.  Psychiatric:     Comments: Flat affect     Data Reviewed: Labs reviewed. Hemoglobin 7.7, sodium 128 up from 124, potassium 5.3, bicarbonate 17, serum creatinine 2.75, BUN 70 There are no new results to review at this time.  Family Communication: Discussed plan of care with patient at the bedside.  She verbalizes understanding and agrees with  Disposition: Status is:  Inpatient Remains inpatient appropriate because: Continues to require IV antibiotics for right TMA stump infection  Planned Discharge Destination: Home with Home Health    Time spent: 35  minutes  Author: Oran Rein, MD 06/04/2022 3:13 PM  For on call review [email or web address].

## 2022-06-05 ENCOUNTER — Inpatient Hospital Stay: Payer: Self-pay

## 2022-06-05 LAB — CBC
HCT: 24.6 % — ABNORMAL LOW (ref 36.0–46.0)
Hemoglobin: 8.1 g/dL — ABNORMAL LOW (ref 12.0–15.0)
MCH: 31.3 pg (ref 26.0–34.0)
MCHC: 32.9 g/dL (ref 30.0–36.0)
MCV: 95 fL (ref 80.0–100.0)
Platelets: 274 10*3/uL (ref 150–400)
RBC: 2.59 MIL/uL — ABNORMAL LOW (ref 3.87–5.11)
RDW: 14.2 % (ref 11.5–15.5)
WBC: 5.6 10*3/uL (ref 4.0–10.5)
nRBC: 0 % (ref 0.0–0.2)

## 2022-06-05 LAB — COMPREHENSIVE METABOLIC PANEL
ALT: 8 U/L (ref 0–44)
AST: 11 U/L — ABNORMAL LOW (ref 15–41)
Albumin: 2.5 g/dL — ABNORMAL LOW (ref 3.5–5.0)
Alkaline Phosphatase: 55 U/L (ref 38–126)
Anion gap: 7 (ref 5–15)
BUN: 64 mg/dL — ABNORMAL HIGH (ref 6–20)
CO2: 22 mmol/L (ref 22–32)
Calcium: 8 mg/dL — ABNORMAL LOW (ref 8.9–10.3)
Chloride: 104 mmol/L (ref 98–111)
Creatinine, Ser: 1.25 mg/dL — ABNORMAL HIGH (ref 0.44–1.00)
GFR, Estimated: 50 mL/min — ABNORMAL LOW (ref >60)
Glucose, Bld: 97 mg/dL (ref 70–99)
Potassium: 4.5 mmol/L (ref 3.5–5.1)
Sodium: 133 mmol/L — ABNORMAL LOW (ref 135–145)
Total Bilirubin: 0.5 mg/dL (ref 0.3–1.2)
Total Protein: 5.7 g/dL — ABNORMAL LOW (ref 6.5–8.1)

## 2022-06-05 LAB — OSMOLALITY: Osmolality: 301 mOsm/kg — ABNORMAL HIGH (ref 275–295)

## 2022-06-05 LAB — GLUCOSE, CAPILLARY
Glucose-Capillary: 97 mg/dL (ref 70–99)
Glucose-Capillary: 97 mg/dL (ref 70–99)
Glucose-Capillary: 93 mg/dL (ref 70–99)
Glucose-Capillary: 88 mg/dL (ref 70–99)

## 2022-06-05 MED ORDER — SODIUM CHLORIDE 0.9% FLUSH
10.0000 mL | Freq: Two times a day (BID) | INTRAVENOUS | Status: DC
  Administered 2022-06-05 – 2022-06-14 (×14): 10 mL

## 2022-06-05 MED ORDER — SODIUM CHLORIDE 0.9% FLUSH
10.0000 mL | INTRAVENOUS | Status: DC | PRN
  Administered 2022-06-12: 10 mL

## 2022-06-05 MED ORDER — NALOXONE HCL 0.4 MG/ML IJ SOLN
0.4000 mg | Freq: Once | INTRAMUSCULAR | Status: AC
  Administered 2022-06-05: 0.4 mg via INTRAVENOUS
  Filled 2022-06-05: qty 1

## 2022-06-05 NOTE — Progress Notes (Signed)
... Progress Note   Patient: [name] [name] [id number] DOB: [birth year] DOA: 05/21/2022     14 DOS: the patient was seen and examined on 06/05/2022   Brief hospital course:  [name] [name] is 57 year old female with history of bipolar disorder, total arthroplasty right shoulder December 2023, chronic pain syndrome, anxiety depression, who presented to emergency room 05/21/22 with altered mental status and redness of the right foot amputation stump, was on Keflex at home, TMA 2 mos prior.  02/02: to ED as above. Septic shock, required brief levo gtt. 02/03: admitted to hospitalist service, underwent I&D w/ podiatry 02/04: stable on IV abx awaiting Cx 02/05: back to OR for revision I&D.   02/06-02/07: stable, planning back to OR 02/08. Cultures from 02/03 (+)MRSA 02/08: to OR for revision of RLE TMA. Per ID, continue vanco and send bone for pathology and culture  02/09: podiatry and ID recs for continue inpatient for now to ensure surgical stabilization and await culture results to guide abx, will likely be here thru the weekend (today is Friday)   02/10-02/11: stable, awaiting cultures 02/12: Surgical pathology resulted: no osteomyelitis. Cr jumped up today, pt reports no urine yesterday. IV fluids. UA no infection, FeNa prerenal. ID and pharmacy following and adjusting abx and other possible nephrotoxic medications. 600 mL UOP today, Cr worsening this afternoon, will give additional 1L fluids, nephrology consulted.  02/13: minimal improvement Cr, worsening hyponatremia, nephrology recs pending. 02/14: Continues to complain about inability to void.  Bladder scan done at noon had 541 cc of urine.  Serum creatinine peaked at 3.56 and today it is 2.75.  Will place Foley catheter 2/15  : Patient seen and examined at bedside lethargic in bed.  Continues to stay on daptomycin pending ID recommendations for further continuation of medication.  2/16 : Patient seen and examined this morning complaining  of not feeling well.  No acute overnight events.  Vital stable.  Labs with persistent hyponatremia with sodium of 125.  Renal function with improvement creatinine 1.7.  Patient continued on daptomycin with last dose on 2/18 as per infectious disease Patient was seen and examined this morning.  No overnight events.  Was lethargic at the time of my evaluation.  Hyponatremia improving. Patient pending PICC line placement for frequent blood draws.holding Lyrica as pt has been lethargic and somnolent.    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 Surgical path no osteomyelitis   Podiatry ok for po abx (doxycycline), podiatry team has signed off 02/12. Follow-up in 1 to 2 weeks with Dr. Amalia Hailey  Wound culture yielded MRSA.  Patient has been on IV vancomycin and last dose was on 05/29/22 Started on daptomycin on 06/02/22 last dose on 2/18.   AKI with hyperkalemia and hyponatremia Sharp increase in Cr.  AKI is thought to be multifactorial and related to IV contrast and possible hypotension during surgical procedure as well as poor oral intake. Serum creatinine peaked at 3.5 6-2.75-2.16-2.08(today)-1.7  Decrease po intake  per RN and patient Decreased UOP per RN and patient  Prerenal FeNa Continue IV fluid hydration.  Improvement in serum sodium levels with hydration Follow BMP closely nephrology consulted, appreciate recs  Start patient on [place] for hyperkalemia-has been stable no indication for  [facility] now Avoid nephrotoxic agents   Depression anxiety bipolar disorder history of seizure disorder cont Buspar, Cymbalta and Lexapro  cont lamotrigine and Topamax   Chronic pain syndrome recent right shoulder arthroplasty cont home Percocet and Lyrica - dose reduced d/t renal    Morbid obesity, BMI 99991111 Complicates treatment and prognosis   Sleep apnea CPAP nightly  Anemia (acute on chronic) Patient with baseline hemoglobin of 10 which has dropped to 7.7 No obvious source of bleeding at this time Obtain iron studies Hold aspirin and Lovenox      Physical Exam: Vitals:   06/04/22 0748 06/04/22 1527 06/04/22 1536 06/04/22 2311  BP: 108/60 133/76 130/88 132/75  Pulse: 68 72 78 69  Resp: 16 16 18 18  $ Temp: 97.9 F (36.6 C) 97.7 F (36.5 C) 97.8 F (36.6 C) 97.8 F (36.6 C)  TempSrc:      SpO2: 100% 100% 99% 97%  Weight:      Height:       Physical Exam Vitals and nursing note reviewed.  Constitutional:      Appearance: She is obese.     Comments: Lethargic and sleepy    HENT:     Head: Normocephalic.     Nose: Nose normal.     Mouth/Throat:     Mouth: Mucous membranes are moist.  Eyes:     Conjunctiva/sclera: Conjunctivae normal.  Cardiovascular:     Rate and Rhythm: Normal rate and regular rhythm.  Pulmonary:     Effort: Pulmonary effort is normal.     Breath sounds: Normal breath sounds.  Abdominal:     General: Bowel sounds are normal.     Palpations: Abdomen is soft.     Comments: Central adiposity  Musculoskeletal:        General: Normal range of motion.     Cervical back: Normal range of motion and neck supple.     Comments: Dressing over right TMA stump  Skin:    General: Skin is warm and dry.  Neurological:     Mental Status: She is alert and oriented to person, place, and time.     Motor: Weakness present.  Psychiatric:     Comments: Flat affect     Data Reviewed: Labs reviewed. Hemoglobin 7.7, sodium 128 up from 124,  potassium 5.3, bicarbonate 17, serum creatinine 2.75, BUN 70 There are no new results to review at this time.  Family Communication: Discussed plan of care with patient at the bedside.  She verbalizes understanding and agrees with  Disposition: Status is: Inpatient Remains inpatient appropriate because: Continues to require IV antibiotics for right TMA stump infection  Planned Discharge Destination: Home with Home Health    Time spent: 35  minutes  Author: Oran Rein, MD 06/05/2022 8:03 AM  For on call review [email or web address].

## 2022-06-05 NOTE — Plan of Care (Signed)
  Problem: Activity: Goal: Risk for activity intolerance will decrease Outcome: Progressing   Problem: Skin Integrity: Goal: Risk for impaired skin integrity will decrease Outcome: Progressing   Problem: Health Behavior/Discharge Planning: Goal: Ability to identify and utilize available resources and services will improve Outcome: Progressing Goal: Ability to manage health-related needs will improve Outcome: Progressing   Problem: Metabolic: Goal: Ability to maintain appropriate glucose levels will improve Outcome: Progressing   Problem: Skin Integrity: Goal: Risk for impaired skin integrity will decrease Outcome: Progressing

## 2022-06-05 NOTE — Progress Notes (Signed)
Patient seen for cpap setup. Patient resting comfortably. O2 in use at 2 liters. Patient easy to arouse. Explained purpose of my visit. Patient asked if she had to wear cpap. Answered order has been placed by physician for nightly use. I asked patient if she uses her cpap at home. Patient states no because she does not have a cpap a home. Patient has declined to wear cpap tonight.

## 2022-06-05 NOTE — Plan of Care (Signed)
  Problem: Clinical Measurements: Goal: Ability to maintain clinical measurements within normal limits will improve Outcome: Progressing Goal: Will remain free from infection Outcome: Progressing Goal: Diagnostic test results will improve Outcome: Progressing Goal: Respiratory complications will improve Outcome: Progressing Goal: Cardiovascular complication will be avoided Outcome: Progressing   Problem: Coping: Goal: Level of anxiety will decrease Outcome: Progressing   Problem: Elimination: Goal: Will not experience complications related to bowel motility Outcome: Progressing   Problem: Pain Managment: Goal: General experience of comfort will improve Outcome: Progressing   Problem: Safety: Goal: Ability to remain free from injury will improve Outcome: Progressing   Problem: Skin Integrity: Goal: Risk for impaired skin integrity will decrease Outcome: Progressing   Problem: Education: Goal: Ability to describe self-care measures that may prevent or decrease complications (Diabetes Survival Skills Education) will improve Outcome: Progressing Goal: Individualized Educational Video(s) Outcome: Progressing   Problem: Coping: Goal: Ability to adjust to condition or change in health will improve Outcome: Progressing   Problem: Fluid Volume: Goal: Ability to maintain a balanced intake and output will improve Outcome: Progressing   Problem: Health Behavior/Discharge Planning: Goal: Ability to identify and utilize available resources and services will improve Outcome: Progressing Goal: Ability to manage health-related needs will improve Outcome: Progressing   Problem: Metabolic: Goal: Ability to maintain appropriate glucose levels will improve Outcome: Progressing   Problem: Nutritional: Goal: Maintenance of adequate nutrition will improve Outcome: Progressing Goal: Progress toward achieving an optimal weight will improve Outcome: Progressing   Problem: Skin  Integrity: Goal: Risk for impaired skin integrity will decrease Outcome: Progressing   Problem: Tissue Perfusion: Goal: Adequacy of tissue perfusion will improve Outcome: Progressing

## 2022-06-05 NOTE — Progress Notes (Signed)
PT Cancellation Note  Patient Details Name: [name] [name] MRN: [id number] DOB: [birth year]   Cancelled Treatment:    Reason Eval/Treat Not Completed: Fatigue/lethargy limiting ability to participate;Medical issues which prohibited therapy  Attempted x 2 today.  On first attempt she is awake and stated she does not feel well and does not want to participate.  Returned later and she is asleep and does not awaken to voice and entering room.  Will resume tomorrow.   Chesley Noon 06/05/2022, 1:34 PM

## 2022-06-05 NOTE — Progress Notes (Signed)
Peripherally Inserted Central Catheter Placement  The IV Nurse has discussed with the patient and/or persons authorized to consent for the patient, the purpose of this procedure and the potential benefits and risks involved with this procedure.  The benefits include less needle sticks, lab draws from the catheter, and the patient may be discharged home with the catheter. Risks include, but not limited to, infection, bleeding, blood clot (thrombus formation), and puncture of an artery; nerve damage and irregular heartbeat and possibility to perform a PICC exchange if needed/ordered by physician.  Alternatives to this procedure were also discussed.  Bard Power PICC patient education guide, fact sheet on infection prevention and patient information card has been provided to patient /or left at bedside.    PICC Placement Documentation  PICC Single Lumen 06/05/22 Left Brachial 41 cm 0 cm (Active)  Indication for Insertion or Continuance of Line Limited venous access - need for IV therapy >5 days (PICC only) 06/05/22 1636  Exposed Catheter (cm) 0 cm 06/05/22 1636  Site Assessment Clean, Dry, Intact 06/05/22 1636  Line Status Saline locked;Blood return noted 06/05/22 1636  Dressing Type Transparent;Securing device 06/05/22 1636  Dressing Status Antimicrobial disc in place;Clean, Dry, Intact 06/05/22 1636  Safety Lock Not Applicable [id number]  Line Care Connections checked and tightened 06/05/22 1636  Line Adjustment (NICU/IV Team Only) No 06/05/22 1636  Dressing Intervention New dressing 06/05/22 1636  Dressing Change Due 06/12/22 06/05/22 1636       Rosalio Macadamia Chenice 06/05/2022, 4:37 PM

## 2022-06-05 NOTE — Progress Notes (Signed)
[facility]  ROUNDING NOTE   Subjective:   Patient a bit somnolent this a.m. Good urine output noted. No new creatinine today.  Objective:  Vital signs in last 24 hours:  Temp:  [97.7 F (36.5 C)-97.8 F (36.6 C)] 97.7 F (36.5 C) (02/17 0821) Pulse Rate:  [63-78] 63 (02/17 0821) Resp:  [16-18] 16 (02/17 0821) BP: (124-133)/(71-88) 124/71 (02/17 0821) SpO2:  [97 %-100 %] 98 % (02/17 0821)  Weight change:  Filed Weights   05/22/22 1221 05/24/22 1637 05/27/22 1457  Weight: 108.1 kg 108 kg 104.3 kg    Intake/Output: I/O last 3 completed shifts: In: 122.1 [I.V.:62.1; IV Piggyback:60] Out: 2550 [Urine:2550]   Intake/Output this shift:  Total I/O In: -  Out: 300 [Urine:300]  Physical Exam: General: NAD, flat affect  Head: Normocephalic, atraumatic. Moist oral mucosal membranes  Eyes: Anicteric  Lungs:  Clear to auscultation, normal effort  Heart: Regular rate and rhythm  Abdomen:  Soft, nontender  Extremities: No peripheral edema.  Neurologic: Alert, moving all four extremities  Skin: No lesions, TMA surgical dressing  Access: None    Basic Metabolic Panel: Recent Labs  Lab 06/01/22 0504 06/02/22 0418 06/03/22 0438 06/03/22 0952 06/04/22 0157  NA 124* 128* 128* 128* 125*  K 4.5 5.3* 4.6 4.3 4.1  CL 99 104 103 101 96*  CO2 17* 17* 17* 19* 21*  GLUCOSE 108* 99 95 97 118*  BUN 61* 70* 74* 76* 78*  CREATININE 3.39* 2.75* 2.16* 2.08* 1.70*  CALCIUM 7.9* 8.1* 7.9* 8.0* 7.5*  PHOS  --  9.0*  --   --   --      Liver Function Tests: Recent Labs  Lab 06/02/22 0418  ALBUMIN 2.4*    No results for input(s): "LIPASE", "AMYLASE" in the last 168 hours. No results for input(s): "AMMONIA" in the last 168 hours.  CBC: Recent Labs  Lab 05/30/22 0321 05/31/22 0420 06/02/22 0414 06/03/22 0438  WBC 8.9 7.7 6.5 6.3  HGB 9.3* 8.9* 7.7* 8.2*  HCT 28.2* 27.3* 23.9* 24.9*  MCV 95.9 95.1 96.8 94.7  PLT 325 339 280 302     Cardiac  Enzymes: Recent Labs  Lab 06/01/22 0504  CKTOTAL 12*     BNP: Invalid input(s): "POCBNP"  CBG: Recent Labs  Lab 06/04/22 0743 06/04/22 1152 06/04/22 1656 06/04/22 2132 06/05/22 0828  GLUCAP 108* 129* 94 120* 41     Microbiology: Results for orders placed or performed during the hospital encounter of 05/21/22  Resp panel by RT-PCR (RSV, Flu A&B, Covid) Anterior Nasal Swab     Status: None   Collection Time: 05/21/22 11:39 PM   Specimen: Anterior Nasal Swab  Result Value Ref Range Status   SARS Coronavirus 2 by RT PCR NEGATIVE NEGATIVE Final    Comment: (NOTE) SARS-CoV-2 target nucleic acids are NOT DETECTED.  The SARS-CoV-2 RNA is generally detectable in upper respiratory specimens during the acute phase of infection. The lowest concentration of SARS-CoV-2 viral copies this assay can detect is 138 copies/mL. A negative result does not preclude SARS-Cov-2 infection and should not be used as the sole basis for treatment or other patient management decisions. A negative result may occur with  improper specimen collection/handling, submission of specimen other than nasopharyngeal swab, presence of viral mutation(s) within the areas targeted by this assay, and inadequate number of viral copies(<138 copies/mL). A negative result must be combined with clinical observations, patient history, and epidemiological information. The expected result is Negative.  Fact Sheet  for Patients:  EntrepreneurPulse.com.au  Fact Sheet for Healthcare Providers:  IncredibleEmployment.be  This test is no t yet approved or cleared by the [place] FDA and  has been authorized for detection and/or diagnosis of SARS-CoV-2 by FDA under an Emergency Use Authorization (EUA). This EUA will remain  in effect (meaning this test can be used) for the duration of the COVID-19 declaration under Section 564(b)(1) of the Act, 21 U.S.C.section 360bbb-3(b)(1),  unless the authorization is terminated  or revoked sooner.       Influenza A by PCR NEGATIVE NEGATIVE Final   Influenza B by PCR NEGATIVE NEGATIVE Final    Comment: (NOTE) The Xpert Xpress SARS-CoV-2/FLU/RSV plus assay is intended as an aid in the diagnosis of influenza from Nasopharyngeal swab specimens and should not be used as a sole basis for treatment. Nasal washings and aspirates are unacceptable for Xpert Xpress SARS-CoV-2/FLU/RSV testing.  Fact Sheet for Patients: EntrepreneurPulse.com.au  Fact Sheet for Healthcare Providers: IncredibleEmployment.be  This test is not yet approved or cleared by the [place] FDA and has been authorized for detection and/or diagnosis of SARS-CoV-2 by FDA under an Emergency Use Authorization (EUA). This EUA will remain in effect (meaning this test can be used) for the duration of the COVID-19 declaration under Section 564(b)(1) of the Act, 21 U.S.C. section 360bbb-3(b)(1), unless the authorization is terminated or revoked.     Resp Syncytial Virus by PCR NEGATIVE NEGATIVE Final    Comment: (NOTE) Fact Sheet for Patients: EntrepreneurPulse.com.au  Fact Sheet for Healthcare Providers: IncredibleEmployment.be  This test is not yet approved or cleared by the [place] FDA and has been authorized for detection and/or diagnosis of SARS-CoV-2 by FDA under an Emergency Use Authorization (EUA). This EUA will remain in effect (meaning this test can be used) for the duration of the COVID-19 declaration under Section 564(b)(1) of the Act, 21 U.S.C. section 360bbb-3(b)(1), unless the authorization is terminated or revoked.  Performed at [facility], [address] [place], [place] [address]   Blood Culture (routine x 2)     Status: None   Collection Time: 05/21/22 11:39 PM   Specimen: BLOOD  Result Value Ref Range Status   Specimen Description BLOOD  LEFT ANTECUBITAL  Final   Special Requests   Final    BOTTLES DRAWN AEROBIC AND ANAEROBIC Blood Culture adequate volume   Culture   Final    NO GROWTH 5 DAYS Performed at [facility], [address] [place], [place] [address]    Report Status 05/26/2022 FINAL  Final  Blood Culture (routine x 2)     Status: None   Collection Time: 05/21/22 11:44 PM   Specimen: BLOOD  Result Value Ref Range Status   Specimen Description BLOOD RIGHT ANTECUBITAL  Final   Special Requests   Final    BOTTLES DRAWN AEROBIC AND ANAEROBIC Blood Culture results may not be optimal due to an excessive volume of blood received in culture bottles   Culture   Final    NO GROWTH 5 DAYS Performed at [facility], [address] [place], [place] [address]    Report Status 05/27/2022 FINAL  Final  MRSA Next Gen by PCR, Nasal     Status: None   Collection Time: 05/22/22 12:26 PM   Specimen: Nasal Mucosa; Nasal Swab  Result Value Ref Range Status   MRSA by PCR Next Gen NOT DETECTED NOT DETECTED Final    Comment: (NOTE) The GeneXpert MRSA Assay (FDA approved for  NASAL specimens only), is one component of a comprehensive MRSA colonization surveillance program. It is not intended to diagnose MRSA infection nor to guide or monitor treatment for MRSA infections. Test performance is not FDA approved in patients less than 45 years old. Performed at [facility], [address] [place], [place] [address]   Aerobic/Anaerobic Culture w Gram Stain (surgical/deep wound)     Status: None   Collection Time: 05/22/22  4:38 PM   Specimen: Foot, Right; Abscess  Result Value Ref Range Status   Specimen Description   Final    FOOT Performed at [facility], [address], [place], [place] [address]    Special Requests   Final    RT FOOT ABSCESS Performed at [facility], [address] [place], [place] [address]    Gram Stain   Final    RARE WBC PRESENT,BOTH PMN AND  MONONUCLEAR RARE GRAM POSITIVE COCCI IN PAIRS IN CLUSTERS    Culture   Final    MODERATE METHICILLIN RESISTANT STAPHYLOCOCCUS AUREUS NO ANAEROBES ISOLATED Performed at [facility], [address], [place], [place] [address]    Report Status 05/27/2022 FINAL  Final   Organism ID, Bacteria METHICILLIN RESISTANT STAPHYLOCOCCUS AUREUS  Final      Susceptibility   Methicillin resistant staphylococcus aureus - MIC*    CIPROFLOXACIN <=0.5 SENSITIVE Sensitive     ERYTHROMYCIN >=8 RESISTANT Resistant     GENTAMICIN <=0.5 SENSITIVE Sensitive     OXACILLIN >=4 RESISTANT Resistant     TETRACYCLINE <=1 SENSITIVE Sensitive     VANCOMYCIN <=0.5 SENSITIVE Sensitive     TRIMETH/SULFA <=10 SENSITIVE Sensitive     CLINDAMYCIN <=0.25 SENSITIVE Sensitive     RIFAMPIN <=0.5 SENSITIVE Sensitive     Inducible Clindamycin NEGATIVE Sensitive     * MODERATE METHICILLIN RESISTANT STAPHYLOCOCCUS AUREUS  Aerobic/Anaerobic Culture w Gram Stain (surgical/deep wound)     Status: None   Collection Time: 05/27/22  3:59 PM   Specimen: PATH Other; Tissue  Result Value Ref Range Status   Specimen Description   Final    WOUND RIGHT FOOT AMPUTATION Performed at [facility], [address], [place], [place] [address]    Special Requests   Final    NONE Performed at [facility], [place], [place] [address]    Gram Stain NO WBC SEEN NO ORGANISMS SEEN   Final   Culture   Final    No growth aerobically or anaerobically. Performed at [facility], [place] [address] [place], [place] [address]    Report Status 06/01/2022 FINAL  Final   *Note: Due to a large number of results and/or encounters for the requested time period, some results have not been displayed. A complete set of results can be found in Results Review.    Coagulation Studies: No results for input(s): "LABPROT", "INR" in the last 72 hours.  Urinalysis: No results for input(s): "COLORURINE", "LABSPEC",  "PHURINE", "GLUCOSEU", "HGBUR", "BILIRUBINUR", "KETONESUR", "PROTEINUR", "UROBILINOGEN", "NITRITE", "LEUKOCYTESUR" in the last 72 hours.  Invalid input(s): "APPERANCEUR"    Imaging: No results found.   Medications:    sodium chloride Stopped (05/24/22 1920)   sodium chloride 75 mL/hr at 06/05/22 [id number]   DAPTOmycin (CUBICIN) 500 mg in sodium chloride 0.9 % IVPB 500 mg (06/04/22 1945)    busPIRone  15 mg Oral TID   Chlorhexidine Gluconate Cloth  6 each Topical Q0600   Chlorhexidine Gluconate Cloth  6  each Topical Q0600   DULoxetine  20 mg Oral Daily   escitalopram  10 mg Oral Daily   estradiol  1 mg Oral QHS   insulin aspart  0-6 Units Subcutaneous TID WC   lamoTRIgine  100 mg Oral Daily   naloxegol oxalate  25 mg Oral Daily   pregabalin  50 mg Oral TID   sodium chloride flush  3 mL Intravenous Q12H   topiramate  50 mg Oral BID   acetaminophen **OR** acetaminophen, albuterol, ondansetron (ZOFRAN) IV, mouth rinse, oxyCODONE-acetaminophen  Assessment/ Plan:  [name] [name] is a 58 y.o.  female  with past medical history including GERD, hypertension, sleep apnea, and bipolar 1 disorder, who was admitted to [facility] on 05/21/2022 for Delirium [R41.0] Hyponatremia [E87.1] Cellulitis of right foot [L03.115] Septic shock ([place]) [A41.9, R65.21] Severe sepsis ([place]) [A41.9, R65.20] AMS (altered mental status) [R41.82]   Acute kidney injury appears multifactorial. Offending agents include IV contrast, possible hypotension, and poor oral intake. Normal renal function noted on admission. IV contrast exposure on 05/22/22, kidney injury may be related to delayed nephropathy. May have had hypotension during surgical procedure.   Patient remains on IV fluid hydration.  No new creatinine today.  Continue to periodically monitor renal parameters over the course of the hospitalization.  Lab Results  Component Value Date   CREATININE 1.70 (H) 06/04/2022   CREATININE 2.08 (H) 06/03/2022    CREATININE 2.16 (H) 06/03/2022    Intake/Output Summary (Last 24 hours) at 06/05/2022 1111 Last data filed at 06/05/2022 0823 Gross per 24 hour  Intake 122.14 ml  Output 1750 ml  Net -1627.86 ml    2. Acute metabolic acidosis, no new serum bicarbonate today.  Recommend continued monitoring of serum bicarbonate.   3. Hyperkalemia/Hyponatremia, likely secondary to kidney injury.  Serum potassium was up to 4.1.  Serum sodium is still a bit low at 125.  Continue IV fluid hydration.    LOS: 14 Sulaiman Imbert 2/17/202411:11 AM

## 2022-06-05 NOTE — Plan of Care (Signed)

## 2022-06-06 DIAGNOSIS — I10 Essential (primary) hypertension: Secondary | ICD-10-CM

## 2022-06-06 DIAGNOSIS — K219 Gastro-esophageal reflux disease without esophagitis: Secondary | ICD-10-CM

## 2022-06-06 LAB — BASIC METABOLIC PANEL
Anion gap: 5 (ref 5–15)
BUN: 55 mg/dL — ABNORMAL HIGH (ref 6–20)
CO2: 24 mmol/L (ref 22–32)
Calcium: 7.9 mg/dL — ABNORMAL LOW (ref 8.9–10.3)
Chloride: 107 mmol/L (ref 98–111)
Creatinine, Ser: 1.08 mg/dL — ABNORMAL HIGH (ref 0.44–1.00)
GFR, Estimated: 60 mL/min — ABNORMAL LOW (ref >60)
Glucose, Bld: 96 mg/dL (ref 70–99)
Potassium: 4.3 mmol/L (ref 3.5–5.1)
Sodium: 136 mmol/L (ref 135–145)

## 2022-06-06 LAB — GLUCOSE, CAPILLARY
Glucose-Capillary: 95 mg/dL (ref 70–99)
Glucose-Capillary: 103 mg/dL — ABNORMAL HIGH (ref 70–99)
Glucose-Capillary: 111 mg/dL — ABNORMAL HIGH (ref 70–99)
Glucose-Capillary: 94 mg/dL (ref 70–99)

## 2022-06-06 MED ORDER — SODIUM CHLORIDE 0.9 % IV SOLN
500.0000 mg | Freq: Every day | INTRAVENOUS | Status: AC
  Administered 2022-06-06: 500 mg via INTRAVENOUS
  Filled 2022-06-06: qty 10

## 2022-06-06 NOTE — Progress Notes (Signed)
PT Cancellation Note  Patient Details Name: [name] [name] MRN: [id number] DOB: [birth year]   Cancelled Treatment:    Reason Eval/Treat Not Completed: Fatigue/lethargy limiting ability to participate  Attempted session this am.  Pt lethargic.  Does open eyes briefly but mumbles answer.  Educated and encouraged mobility skills and importance of OOB daily.  She does not respond further but keeps eyes closed and turns her head.    Pt has not participated in therapy sessions for several days now.  Initially plan was to return home with family support.  Given extended time in bad with no mobility, it is likely she will need higher level of care upon discharge.  Will continue to assess daily In hopes she is able to participate in therapy sessions.  Chesley Noon 06/06/2022, 10:24 AM

## 2022-06-06 NOTE — Plan of Care (Signed)

## 2022-06-06 NOTE — Progress Notes (Signed)
[facility]... 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  02/02: to ED as above. Septic shock, required brief levo gtt. 02/03: admitted to hospitalist service, underwent I&D w/ podiatry 02/04: stable on IV abx awaiting Cx 02/05: back to OR for revision I&D.   02/06-02/07: stable, planning back to OR 02/08. Cultures from 02/03 (+)MRSA 02/08: to OR for revision of RLE TMA. Per ID, continue vanco and send bone for pathology and culture  02/09: podiatry and ID recs for continue inpatient for now to ensure surgical stabilization and await culture results to guide abx, will likely be here thru the weekend (today is Friday)   02/10-02/11: stable, awaiting cultures 02/12: Surgical pathology resulted: no osteomyelitis. Cr jumped up today, pt reports no urine yesterday. IV fluids. UA no infection, FeNa prerenal. ID and pharmacy following and adjusting abx and other possible nephrotoxic medications. 600 mL UOP today, Cr worsening this afternoon, will give additional 1L fluids, nephrology consulted.  02/13: minimal improvement Cr, worsening hyponatremia, nephrology recs pending. 02/14: Continues to complain about inability to void.  Bladder scan done at noon had 541 cc of urine.  Serum creatinine peaked at 3.56 and today it is 2.75.  Will place Foley catheter 2/15  : Patient seen and examined at bedside lethargic in bed.  Continues to stay on daptomycin pending ID recommendations for further continuation of medication.  2/16 : Patient seen and examined this morning complaining  of not feeling well.  No acute overnight events.  Vital stable.  Labs with persistent hyponatremia with sodium of 125.  Renal function with improvement creatinine 1.7.  Patient continued on daptomycin with last dose on 2/18 as per infectious disease Patient was seen and examined this morning.  No overnight events.  Was lethargic at the time of my evaluation.  Hyponatremia improving. Patient pending PICC line placement for frequent blood draws.holding Lyrica as pt has been lethargic and somnolent.  Patient seen and examined this morning.  Still lethargic no overnight events reported.  Patient had PICC line placed for frequent blood draws.  Patient will have last dose of daptomycin today on 2/18 as per ID.  PT needs to evaluate the patient for safe discharge.  Patient is from home initial plan was to return to home however because of deconditioning would need higher level of care at the time of discharge.  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 Surgical path no osteomyelitis   Podiatry ok for po abx (doxycycline), podiatry team has signed off 02/12. Follow-up in 1 to 2 weeks with Dr.  Evans  Wound culture yielded MRSA.  Patient has been on IV vancomycin and last dose was on 05/29/22 Started on daptomycin on 06/02/22 last dose on 2/18.   AKI with hyperkalemia and hyponatremia Sharp increase in Cr.  AKI is thought to be multifactorial and related to IV contrast and possible hypotension during surgical  procedure as well as poor oral intake. Serum creatinine peaked-3.5 now resolved  Decrease po intake per RN and patient Decreased UOP per RN and patient  Prerenal FeNa Continue IV fluid hydration.  Improvement in serum sodium levels with hydration Follow BMP closely nephrology consulted, appreciate recs  Start patient on Lokelma for hyperkalemia-has been stable no indication for [name] now Avoid nephrotoxic agents   Depression anxiety bipolar disorder history of seizure disorder cont Buspar, Cymbalta and Lexapro  cont lamotrigine and Topamax   Chronic pain syndrome recent right shoulder arthroplasty cont home Percocet and Lyrica - dose reduced d/t renal    Morbid obesity, BMI [id number] Complicates treatment and prognosis   Sleep apnea CPAP nightly  Anemia (acute on chronic) Patient with baseline hemoglobin of 10 which has dropped to 7.7 No obvious source of bleeding at this time Obtain iron studies Hold aspirin and Lovenox      Physical Exam: Vitals:   06/05/22 0821 06/05/22 1118 06/06/22 0024 06/06/22 0814  BP: 124/71 139/78 (!) 140/70 135/78  Pulse: 63 73 71 66  Resp: 16 17 18 20  $ Temp: 97.7 F (36.5 C) 98.4 F (36.9 C) 98 F (36.7 C) 97.8 F (36.6 C)  TempSrc:      SpO2: 98% 100% 98% 99%  Weight:      Height:       Physical Exam Vitals and nursing note reviewed.  Constitutional:      Appearance: She is obese.     Comments: Lethargic and sleepy    HENT:     Head: Normocephalic.     Nose: Nose normal.     Mouth/Throat:     Mouth: Mucous membranes are moist.  Eyes:     Conjunctiva/sclera: Conjunctivae normal.  Cardiovascular:     Rate and Rhythm: Normal rate and regular rhythm.  Pulmonary:     Effort: Pulmonary effort is normal.     Breath sounds: Normal breath sounds.  Abdominal:     General: Bowel sounds are normal.     Palpations: Abdomen is soft.     Comments: Central adiposity  Musculoskeletal:        General: Normal range of motion.      Cervical back: Normal range of motion and neck supple.     Comments: Dressing over right TMA stump  Skin:    General: Skin is warm and dry.  Neurological:     Mental Status: She is alert and oriented to person, place, and time.     Motor: Weakness present.  Psychiatric:     Comments: Flat affect     Data Reviewed: Labs reviewed. Hemoglobin 7.7, sodium 128 up from 124, potassium 5.3, bicarbonate 17, serum creatinine 2.75, BUN 70 There are no new results to review at this time.  Family Communication: Discussed plan of care with patient at the bedside.  She verbalizes understanding and agrees with  Disposition: Status is: Inpatient Remains inpatient appropriate because: Continues to require IV antibiotics for right TMA stump infection  Planned Discharge Destination: Home with Home Health    Time spent: 35  minutes  Author: Oran Rein, MD 06/06/2022 11:10 AM  For on call review  [email or web address]

## 2022-06-06 NOTE — Progress Notes (Signed)
[facility]  ROUNDING NOTE   Subjective:   Renal function improved. Creatinine down to 1.08.  Objective:  Vital signs in last 24 hours:  Temp:  [97.7 F (36.5 C)-98 F (36.7 C)] 97.7 F (36.5 C) (02/18 1129) Pulse Rate:  [64-71] 64 (02/18 1129) Resp:  [18-20] 19 (02/18 1129) BP: (135-143)/(70-88) 143/88 (02/18 1129) SpO2:  [98 %-100 %] 100 % (02/18 1129)  Weight change:  Filed Weights   05/22/22 1221 05/24/22 1637 05/27/22 1457  Weight: 108.1 kg 108 kg 104.3 kg    Intake/Output: I/O last 3 completed shifts: In: 43 [IV Piggyback:60] Out: 2600 [Urine:2600]   Intake/Output this shift:  No intake/output data recorded.  Physical Exam: General: NAD, flat affect  Head: Normocephalic, atraumatic. Moist oral mucosal membranes  Eyes: Anicteric  Lungs:  Clear to auscultation, normal effort  Heart: Regular rate and rhythm  Abdomen:  Soft, nontender  Extremities: No peripheral edema.  Neurologic: Alert, moving all four extremities  Skin: No lesions, TMA surgical dressing  Access: None    Basic Metabolic Panel: Recent Labs  Lab 06/02/22 0418 06/03/22 0438 06/03/22 0952 06/04/22 0157 06/05/22 1154 06/06/22 0626  NA 128* 128* 128* 125* 133* 136  K 5.3* 4.6 4.3 4.1 4.5 4.3  CL 104 103 101 96* 104 107  CO2 17* 17* 19* 21* 22 24  GLUCOSE 99 95 97 118* 97 96  BUN 70* 74* 76* 78* 64* 55*  CREATININE 2.75* 2.16* 2.08* 1.70* 1.25* 1.08*  CALCIUM 8.1* 7.9* 8.0* 7.5* 8.0* 7.9*  PHOS 9.0*  --   --   --   --   --      Liver Function Tests: Recent Labs  Lab 06/02/22 0418 06/05/22 1154  AST  --  11*  ALT  --  8  ALKPHOS  --  55  BILITOT  --  0.5  PROT  --  5.7*  ALBUMIN 2.4* 2.5*    No results for input(s): "LIPASE", "AMYLASE" in the last 168 hours. No results for input(s): "AMMONIA" in the last 168 hours.  CBC: Recent Labs  Lab 05/31/22 0420 06/02/22 0414 06/03/22 0438 06/05/22 1154  WBC 7.7 6.5 6.3 5.6  HGB 8.9* 7.7* 8.2* 8.1*  HCT 27.3*  23.9* 24.9* 24.6*  MCV 95.1 96.8 94.7 95.0  PLT 339 280 302 274     Cardiac Enzymes: Recent Labs  Lab 06/01/22 0504  CKTOTAL 12*     BNP: Invalid input(s): "POCBNP"  CBG: Recent Labs  Lab 06/05/22 1205 06/05/22 1800 06/05/22 2106 06/06/22 0811 06/06/22 1122  GLUCAP 97 93 88 95 103*     Microbiology: Results for orders placed or performed during the hospital encounter of 05/21/22  Resp panel by RT-PCR (RSV, Flu A&B, Covid) Anterior Nasal Swab     Status: None   Collection Time: 05/21/22 11:39 PM   Specimen: Anterior Nasal Swab  Result Value Ref Range Status   SARS Coronavirus 2 by RT PCR NEGATIVE NEGATIVE Final    Comment: (NOTE) SARS-CoV-2 target nucleic acids are NOT DETECTED.  The SARS-CoV-2 RNA is generally detectable in upper respiratory specimens during the acute phase of infection. The lowest concentration of SARS-CoV-2 viral copies this assay can detect is 138 copies/mL. A negative result does not preclude SARS-Cov-2 infection and should not be used as the sole basis for treatment or other patient management decisions. A negative result may occur with  improper specimen collection/handling, submission of specimen other than nasopharyngeal swab, presence of viral mutation(s) within the  areas targeted by this assay, and inadequate number of viral copies(<138 copies/mL). A negative result must be combined with clinical observations, patient history, and epidemiological information. The expected result is Negative.  Fact Sheet for Patients:  EntrepreneurPulse.com.au  Fact Sheet for Healthcare Providers:  IncredibleEmployment.be  This test is no t yet approved or cleared by the [place] FDA and  has been authorized for detection and/or diagnosis of SARS-CoV-2 by FDA under an Emergency Use Authorization (EUA). This EUA will remain  in effect (meaning this test can be used) for the duration of the COVID-19  declaration under Section 564(b)(1) of the Act, 21 U.S.C.section 360bbb-3(b)(1), unless the authorization is terminated  or revoked sooner.       Influenza A by PCR NEGATIVE NEGATIVE Final   Influenza B by PCR NEGATIVE NEGATIVE Final    Comment: (NOTE) The Xpert Xpress SARS-CoV-2/FLU/RSV plus assay is intended as an aid in the diagnosis of influenza from Nasopharyngeal swab specimens and should not be used as a sole basis for treatment. Nasal washings and aspirates are unacceptable for Xpert Xpress SARS-CoV-2/FLU/RSV testing.  Fact Sheet for Patients: EntrepreneurPulse.com.au  Fact Sheet for Healthcare Providers: IncredibleEmployment.be  This test is not yet approved or cleared by the [place] FDA and has been authorized for detection and/or diagnosis of SARS-CoV-2 by FDA under an Emergency Use Authorization (EUA). This EUA will remain in effect (meaning this test can be used) for the duration of the COVID-19 declaration under Section 564(b)(1) of the Act, 21 U.S.C. section 360bbb-3(b)(1), unless the authorization is terminated or revoked.     Resp Syncytial Virus by PCR NEGATIVE NEGATIVE Final    Comment: (NOTE) Fact Sheet for Patients: EntrepreneurPulse.com.au  Fact Sheet for Healthcare Providers: IncredibleEmployment.be  This test is not yet approved or cleared by the [place] FDA and has been authorized for detection and/or diagnosis of SARS-CoV-2 by FDA under an Emergency Use Authorization (EUA). This EUA will remain in effect (meaning this test can be used) for the duration of the COVID-19 declaration under Section 564(b)(1) of the Act, 21 U.S.C. section 360bbb-3(b)(1), unless the authorization is terminated or revoked.  Performed at [facility], [address] [place], [place] [address]   Blood Culture (routine x 2)     Status: None   Collection Time: 05/21/22 11:39 PM    Specimen: BLOOD  Result Value Ref Range Status   Specimen Description BLOOD LEFT ANTECUBITAL  Final   Special Requests   Final    BOTTLES DRAWN AEROBIC AND ANAEROBIC Blood Culture adequate volume   Culture   Final    NO GROWTH 5 DAYS Performed at [facility], [address] [place], [place] [address]    Report Status 05/26/2022 FINAL  Final  Blood Culture (routine x 2)     Status: None   Collection Time: 05/21/22 11:44 PM   Specimen: BLOOD  Result Value Ref Range Status   Specimen Description BLOOD RIGHT ANTECUBITAL  Final   Special Requests   Final    BOTTLES DRAWN AEROBIC AND ANAEROBIC Blood Culture results may not be optimal due to an excessive volume of blood received in culture bottles   Culture   Final    NO GROWTH 5 DAYS Performed at [facility], [address], [place], [place] [address]    Report Status 05/27/2022 FINAL  Final  MRSA Next Gen by PCR, Nasal     Status: None   Collection Time: 05/22/22 12:26 PM   Specimen:  Nasal Mucosa; Nasal Swab  Result Value Ref Range Status   MRSA by PCR Next Gen NOT DETECTED NOT DETECTED Final    Comment: (NOTE) The GeneXpert MRSA Assay (FDA approved for NASAL specimens only), is one component of a comprehensive MRSA colonization surveillance program. It is not intended to diagnose MRSA infection nor to guide or monitor treatment for MRSA infections. Test performance is not FDA approved in patients less than 51 years old. Performed at [facility], [address] [place], [place] [address]   Aerobic/Anaerobic Culture w Gram Stain (surgical/deep wound)     Status: None   Collection Time: 05/22/22  4:38 PM   Specimen: Foot, Right; Abscess  Result Value Ref Range Status   Specimen Description   Final    FOOT Performed at [facility], [address], [place], [place] [address]    Special Requests   Final    RT FOOT ABSCESS Performed at [facility], [address]  [place], [place] [address]    Gram Stain   Final    RARE WBC PRESENT,BOTH PMN AND MONONUCLEAR RARE GRAM POSITIVE COCCI IN PAIRS IN CLUSTERS    Culture   Final    MODERATE METHICILLIN RESISTANT STAPHYLOCOCCUS AUREUS NO ANAEROBES ISOLATED Performed at [facility], [address], [place], [place] [address]    Report Status 05/27/2022 FINAL  Final   Organism ID, Bacteria METHICILLIN RESISTANT STAPHYLOCOCCUS AUREUS  Final      Susceptibility   Methicillin resistant staphylococcus aureus - MIC*    CIPROFLOXACIN <=0.5 SENSITIVE Sensitive     ERYTHROMYCIN >=8 RESISTANT Resistant     GENTAMICIN <=0.5 SENSITIVE Sensitive     OXACILLIN >=4 RESISTANT Resistant     TETRACYCLINE <=1 SENSITIVE Sensitive     VANCOMYCIN <=0.5 SENSITIVE Sensitive     TRIMETH/SULFA <=10 SENSITIVE Sensitive     CLINDAMYCIN <=0.25 SENSITIVE Sensitive     RIFAMPIN <=0.5 SENSITIVE Sensitive     Inducible Clindamycin NEGATIVE Sensitive     * MODERATE METHICILLIN RESISTANT STAPHYLOCOCCUS AUREUS  Aerobic/Anaerobic Culture w Gram Stain (surgical/deep wound)     Status: None   Collection Time: 05/27/22  3:59 PM   Specimen: PATH Other; Tissue  Result Value Ref Range Status   Specimen Description   Final    WOUND RIGHT FOOT AMPUTATION Performed at [facility], [address], [place], [place] [address]    Special Requests   Final    NONE Performed at [facility], [place], [place] [address]    Gram Stain NO WBC SEEN NO ORGANISMS SEEN   Final   Culture   Final    No growth aerobically or anaerobically. Performed at [facility], [place] [address], [place], [place] [address]    Report Status 06/01/2022 FINAL  Final   *Note: Due to a large number of results and/or encounters for the requested time period, some results have not been displayed. A complete set of results can be found in Results Review.    Coagulation Studies: No results for input(s): "LABPROT", "INR" in the last  72 hours.  Urinalysis: No results for input(s): "COLORURINE", "LABSPEC", "PHURINE", "GLUCOSEU", "HGBUR", "BILIRUBINUR", "KETONESUR", "PROTEINUR", "UROBILINOGEN", "NITRITE", "LEUKOCYTESUR" in the last 72 hours.  Invalid input(s): "APPERANCEUR"    Imaging: [place] EKG SITE RITE  Result Date: 06/05/2022 If Site Rite image not attached, placement could not be confirmed due to current cardiac rhythm.    Medications:  sodium chloride Stopped (05/24/22 1920)   sodium chloride 75 mL/hr at 06/06/22 0736   DAPTOmycin (CUBICIN) 500 mg in sodium chloride 0.9 % IVPB      busPIRone  15 mg Oral TID   Chlorhexidine Gluconate Cloth  6 each Topical Q0600   Chlorhexidine Gluconate Cloth  6 each Topical Q0600   DULoxetine  20 mg Oral Daily   escitalopram  10 mg Oral Daily   estradiol  1 mg Oral QHS   insulin aspart  0-6 Units Subcutaneous TID WC   lamoTRIgine  100 mg Oral Daily   naloxegol oxalate  25 mg Oral Daily   sodium chloride flush  10-40 mL Intracatheter Q12H   sodium chloride flush  3 mL Intravenous Q12H   topiramate  50 mg Oral BID   acetaminophen **OR** acetaminophen, albuterol, ondansetron (ZOFRAN) IV, mouth rinse, oxyCODONE-acetaminophen, sodium chloride flush  Assessment/ Plan:  [name] is a 57 y.o.  female  with past medical history including GERD, hypertension, sleep apnea, and bipolar 1 disorder, who was admitted to [facility] on 05/21/2022 for Delirium [R41.0] Hyponatremia [E87.1] Cellulitis of right foot [L03.115] Septic shock ([place]) [A41.9, R65.21] Severe sepsis ([place]) [A41.9, R65.20] AMS (altered mental status) [R41.82]   Acute kidney injury appears multifactorial. Offending agents include IV contrast, possible hypotension, and poor oral intake. Normal renal function noted on admission. IV contrast exposure on 05/22/22, kidney injury may be related to delayed nephropathy. May have had hypotension during surgical procedure.   Creatinine down to 1.08.  BUN also improved to  55.  May continue IV fluid hydration.  No further renal input at this time.  Lab Results  Component Value Date   CREATININE 1.08 (H) 06/06/2022   CREATININE 1.25 (H) 06/05/2022   CREATININE 1.70 (H) 06/04/2022    Intake/Output Summary (Last 24 hours) at 06/06/2022 1234 Last data filed at 06/06/2022 0500 Gross per 24 hour  Intake 60 ml  Output 1600 ml  Net -1540 ml    2. Acute metabolic acidosis, serum bicarbonate corrected and now up to 24.   3. Hyperkalemia/Hyponatremia, likely secondary to kidney injury.  Now resolved.  Potassium 4.3 with sodium of 136.  4.  No further renal input.  Therefore we will sign off.    LOS: 15 Sandy Haye 2/18/202412:34 PM

## 2022-06-07 ENCOUNTER — Inpatient Hospital Stay: Payer: 59

## 2022-06-07 DIAGNOSIS — L03115 Cellulitis of right lower limb: Secondary | ICD-10-CM | POA: Diagnosis not present

## 2022-06-07 LAB — BASIC METABOLIC PANEL
Anion gap: 4 — ABNORMAL LOW (ref 5–15)
Anion gap: 5 (ref 5–15)
BUN: 44 mg/dL — ABNORMAL HIGH (ref 6–20)
BUN: 39 mg/dL — ABNORMAL HIGH (ref 6–20)
CO2: 24 mmol/L (ref 22–32)
CO2: 22 mmol/L (ref 22–32)
Calcium: 7.8 mg/dL — ABNORMAL LOW (ref 8.9–10.3)
Calcium: 7.9 mg/dL — ABNORMAL LOW (ref 8.9–10.3)
Chloride: 111 mmol/L (ref 98–111)
Chloride: 110 mmol/L (ref 98–111)
Creatinine, Ser: 1.08 mg/dL — ABNORMAL HIGH (ref 0.44–1.00)
Creatinine, Ser: 1.07 mg/dL — ABNORMAL HIGH (ref 0.44–1.00)
GFR, Estimated: 60 mL/min — ABNORMAL LOW (ref >60)
GFR, Estimated: >60 mL/min (ref >60)
Glucose, Bld: 94 mg/dL (ref 70–99)
Glucose, Bld: 131 mg/dL — ABNORMAL HIGH (ref 70–99)
Potassium: 4.4 mmol/L (ref 3.5–5.1)
Potassium: 4.3 mmol/L (ref 3.5–5.1)
Sodium: 139 mmol/L (ref 135–145)
Sodium: 137 mmol/L (ref 135–145)

## 2022-06-07 LAB — BLOOD GAS, ARTERIAL
Acid-Base Excess: 1 mmol/L (ref 0.0–2.0)
Bicarbonate: 24.2 mmol/L (ref 20.0–28.0)
O2 Saturation: 98.8 %
Patient temperature: 37
pCO2 arterial: 34 mmHg (ref 32–48)
pH, Arterial: 7.46 — ABNORMAL HIGH (ref 7.35–7.45)
pO2, Arterial: 89 mmHg (ref 83–108)

## 2022-06-07 LAB — CBC
HCT: 23.2 % — ABNORMAL LOW (ref 36.0–46.0)
Hemoglobin: 7.4 g/dL — ABNORMAL LOW (ref 12.0–15.0)
MCH: 31.2 pg (ref 26.0–34.0)
MCHC: 31.9 g/dL (ref 30.0–36.0)
MCV: 97.9 fL (ref 80.0–100.0)
Platelets: 222 10*3/uL (ref 150–400)
RBC: 2.37 MIL/uL — ABNORMAL LOW (ref 3.87–5.11)
RDW: 14.1 % (ref 11.5–15.5)
WBC: 5.8 10*3/uL (ref 4.0–10.5)
nRBC: 0 % (ref 0.0–0.2)

## 2022-06-07 LAB — GLUCOSE, CAPILLARY
Glucose-Capillary: 86 mg/dL (ref 70–99)
Glucose-Capillary: 106 mg/dL — ABNORMAL HIGH (ref 70–99)
Glucose-Capillary: 113 mg/dL — ABNORMAL HIGH (ref 70–99)
Glucose-Capillary: 147 mg/dL — ABNORMAL HIGH (ref 70–99)

## 2022-06-07 LAB — AMMONIA: Ammonia: 26 umol/L (ref 9–35)

## 2022-06-07 LAB — HEPATIC FUNCTION PANEL
ALT: 10 U/L (ref 0–44)
AST: 11 U/L — ABNORMAL LOW (ref 15–41)
Albumin: 2.3 g/dL — ABNORMAL LOW (ref 3.5–5.0)
Alkaline Phosphatase: 62 U/L (ref 38–126)
Bilirubin, Direct: 0.1 mg/dL (ref 0.0–0.2)
Indirect Bilirubin: 0.5 mg/dL (ref 0.3–0.9)
Total Bilirubin: 0.6 mg/dL (ref 0.3–1.2)
Total Protein: 5.4 g/dL — ABNORMAL LOW (ref 6.5–8.1)

## 2022-06-07 LAB — TSH: TSH: 2.259 u[IU]/mL (ref 0.350–4.500)

## 2022-06-07 LAB — MAGNESIUM: Magnesium: 3 mg/dL — ABNORMAL HIGH (ref 1.7–2.4)

## 2022-06-07 MED ORDER — DEXTROSE-NACL 5-0.9 % IV SOLN: INTRAVENOUS | Status: DC

## 2022-06-07 NOTE — Progress Notes (Signed)
   Date of Admission:  05/21/2022     ID: [name] [name] is a 56 y.o. female  Principal Problem:   Cellulitis of right foot Active Problems:   GERD (gastroesophageal reflux disease)   Essential hypertension   AKI (acute kidney injury) ([place])   Severe sepsis ([name])   AMS (altered mental status)   Septic shock ([name])   Abscess of right foot   Morbid obesity ([place])   Right foot infection  Pt with multiple fusion surgeries to the spine, rt reverse shoulder arthroplasty, rt TMA was admitted for altered mental status and infection at the TMA site- CT scan did no show any osteo underwent I/D on 05/22/22 MRSA in culture ( previously it had been MSSA)    Subjective: Obtunded     Medications:   busPIRone  15 mg Oral TID   Chlorhexidine Gluconate Cloth  6 each Topical Q0600   Chlorhexidine Gluconate Cloth  6 each Topical Q0600   DULoxetine  20 mg Oral Daily   escitalopram  10 mg Oral Daily   estradiol  1 mg Oral QHS   insulin aspart  0-6 Units Subcutaneous TID WC   lamoTRIgine  100 mg Oral Daily   naloxegol oxalate  25 mg Oral Daily   sodium chloride flush  10-40 mL Intracatheter Q12H   sodium chloride flush  3 mL Intravenous Q12H   topiramate  50 mg Oral BID    Objective: Vital signs in last 24 hours: Patient Vitals for the past 24 hrs:  BP Temp Pulse Resp SpO2  06/07/22 0743 133/73 98.1 F (36.7 C) 64 16 97 %  06/07/22 0011 (!) 147/79 97.9 F (36.6 C) 67 16 97 %  06/06/22 1609 132/83 97.7 F (36.5 C) 69 16 100 %  06/06/22 1129 (!) 143/88 97.7 F (36.5 C) 64 19 100 %       PHYSICAL EXAM:  Barely arousable Nods her head to questions Chest B/l air entry [id number] Abd soft Rt foot TMA site is well coapted- no erythema or discharge        Lab Results Recent Labs    06/05/22 1154 06/06/22 0626 06/07/22 0650  WBC 5.6  --  5.8  HGB 8.1*  --  7.4*  HCT 24.6*  --  23.2*  NA 133* 136 139  K 4.5 4.3 4.4  CL 104 107 111  CO2 22 24 24  $ BUN 64* 55* 44*   CREATININE 1.25* 1.08* 1.08*    Microbiology: MRSA from 05/22/22 culture 05/27/22 surgical culture NG Studies/Results: Xray rt foot- no osteo  Pathology from 05/27/22 of the bone no acute osteo   Assessment/Plan: Rt TMA site infection- underwent I/D and has MRSA infection- was on vanco Went back for revision TMA on 05/27/22 culture NG and pathology no acute osteo As patient was having AKI ,DC vanco As no osteo - she will not need IV antibiotic Doxy PO is oral option- but syncope /coma listed for tetracycline Bactrim cannot be used because of AKI Cipro rash Linezolid cannot be used because of multiple SSRI Pt completed 12 days of Dapto and it has been DC  Will check her foot tomorrow  Encephalopathy- has OSA- need CPAP She has not received Opioids since 2/16 On triple SSRI  AKI- resolved combination of incomplete emptying ( bladder scan 600cc) vanco/contrast-  started IV fluids  H/o multiple surgeries to spine  Depression/anxiety on meds  Discussed the management with  her nurse and care team

## 2022-06-07 NOTE — Plan of Care (Signed)
  Problem: Education: Goal: Knowledge of General Education information will improve Description: Including pain rating scale, medication(s)/side effects and non-pharmacologic comfort measures Outcome: Progressing   Problem: Coping: Goal: Level of anxiety will decrease Outcome: Progressing   Problem: Pain Managment: Goal: General experience of comfort will improve Outcome: Progressing   Problem: Safety: Goal: Ability to remain free from injury will improve Outcome: Progressing   Problem: Skin Integrity: Goal: Risk for impaired skin integrity will decrease Outcome: Progressing   Problem: Metabolic: Goal: Ability to maintain appropriate glucose levels will improve Outcome: Progressing   Problem: Skin Integrity: Goal: Risk for impaired skin integrity will decrease Outcome: Progressing   Problem: Tissue Perfusion: Goal: Adequacy of tissue perfusion will improve Outcome: Progressing

## 2022-06-07 NOTE — Progress Notes (Signed)
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  02/02: to ED as above. Septic shock, required brief levo gtt. 02/03: admitted to hospitalist service, underwent I&D w/ podiatry 02/04: stable on IV abx awaiting Cx 02/05: back to OR for revision I&D.   02/06-02/07: stable, planning back to OR 02/08. Cultures from 02/03 (+)MRSA 02/08: to OR for revision of RLE TMA. Per ID, continue vanco and send bone for pathology and culture  02/09: podiatry and ID recs for continue inpatient for now to ensure surgical stabilization and await culture results to guide abx, will likely be here thru the weekend (today is Friday)   02/10-02/11: stable, awaiting cultures 02/12: Surgical pathology resulted: no osteomyelitis. Cr jumped up today, pt reports no urine yesterday. IV fluids. UA no infection, FeNa prerenal. ID and pharmacy following and adjusting abx and other possible nephrotoxic medications. 600 mL UOP today, Cr worsening this afternoon, will give additional 1L fluids, nephrology consulted.  02/13: minimal improvement Cr, worsening hyponatremia, nephrology recs pending. 02/14: Continues to complain about inability to void.  Bladder scan done at noon had 541 cc of urine.  Serum creatinine peaked at 3.56 and today it is 2.75.  Will place Foley catheter 2/15  : Patient seen and examined at bedside lethargic in bed.  Continues to stay on daptomycin pending ID recommendations for further continuation of medication.  2/16 : Patient seen and examined this morning complaining of not  feeling well.  No acute overnight events.  Vital stable.  Labs with persistent hyponatremia with sodium of 125.  Renal function with improvement creatinine 1.7.  Patient continued on daptomycin with last dose on 2/18 as per infectious disease Patient was seen and examined this morning.  No overnight events.  Was lethargic at the time of my evaluation.  Hyponatremia improving. Patient pending PICC line placement for frequent blood draws.holding Lyrica as pt has been lethargic and somnolent.  Patient seen and examined this morning.  Still lethargic no overnight events reported.  Patient had PICC line placed for frequent blood draws.  Patient will have last dose of daptomycin today on 2/18 as per ID.  PT needs to evaluate the patient for safe discharge.  Patient is from home initial plan was to return to home however because of deconditioning would need higher level of care at the time of discharge. 2/19: Patient is seen and examined during rounds and remains very lethargic.  Arouses to name call.  No oral intake.  Blood sugar at bedside 106.  Lyrica has been on hold and will also place Cymbalta on hold as well.  Assessment and Plan:  Principal Problem:   Cellulitis of right foot Active Problems:   Severe sepsis ([name])   GERD (gastroesophageal reflux disease)   Essential hypertension   AMS (altered mental status)   Septic shock ([name])   Abscess of right foot   Septic shock - POA - resolved  Right foot cellulitis and abscess at site of prior TMA with history of prior ulceration (+)MRSA S/p return to the OR on 05/27/2022 for repeat washout, revisional amputation and  resection of necrotic bone and soft tissue and primary closure of the amputation site. Surgical path no osteomyelitis   Wound culture yielded MRSA.  Patient has been on IV vancomycin and last dose was on 05/29/22.  Vancomycin was discontinued due to AKI Started on daptomycin on 06/02/22 last dose on 2/18.    AKI with hyperkalemia and  hyponatremia Sharp increase in Cr.  AKI is thought to be multifactorial and related to IV contrast and possible hypotension during surgical procedure as well as poor oral intake. Serum creatinine peaked-3.5 and shows a downward trend 3.5 >> 1.08.  Patient's baseline is about 0.56 Continue IV fluid hydration.  Improvement in serum sodium levels with hydration Avoid nephrotoxic agents   Depression anxiety bipolar disorder history of seizure disorder cont Buspar and Lexapro  cont lamotrigine and Topamax   Chronic pain syndrome recent right shoulder arthroplasty Lyrica and Percocet areon hold due to metabolic encephalopathy   Morbid obesity, BMI [id number] Complicates treatment and prognosis   Sleep apnea CPAP nightly   Anemia (acute on chronic) Patient with baseline hemoglobin of 10 which has dropped to 7.4 No obvious source of bleeding at this time Aspirin and Lovenox have been on hold Will transfuse for hemoglobin less than 7 Serum iron is within normal limits   Acute metabolic encephalopathy Patient is very lethargic and arouses only to verbal stimuli Vital signs are within normal limits Will obtain CT scan of the head without contrast, obtain ABG Will review meds and hold all sedating medications      Physical Exam: Vitals:   06/06/22 1129 06/06/22 1609 06/07/22 0011 06/07/22 0743  BP: (!) 143/88 132/83 (!) 147/79 133/73  Pulse: 64 69 67 64  Resp: 19 16 16 16  $ Temp: 97.7 F (36.5 C) 97.7 F (36.5 C) 97.9 F (36.6 C) 98.1 F (36.7 C)  TempSrc:      SpO2: 100% 100% 97% 97%  Weight:      Height:       Physical Exam Vitals and nursing note reviewed.  Constitutional:      Comments: Lethargic.  Chronically ill-appearing  HENT:     Head: Normocephalic and atraumatic.     Nose: Nose normal.     Mouth/Throat:     Mouth: Mucous membranes are dry.  Eyes:     Comments: Pale conjunctiva  Cardiovascular:     Rate and Rhythm: Normal rate and regular rhythm.   Pulmonary:     Effort: Pulmonary effort is normal.     Breath sounds: Normal breath sounds.  Abdominal:     General: Bowel sounds are normal.     Palpations: Abdomen is soft.     Comments: Central adiposity  Musculoskeletal:     Cervical back: Normal range of motion and neck supple.     Comments: Dressing over right TMA stump  Skin:    General: Skin is warm and dry.  Neurological:     Comments: Unable to assess  Psychiatric:     Comments: Unable to assess     Data Reviewed: Labs reviewed.  BUN 44, creatinine 1.08, calcium 7.8, sodium 139, potassium 4.4, chloride 111, bicarb 24, Glucose 94, hemoglobin 7.4, hematocrit 23.2 There are no new results to review at this time.  Family Communication: Called and spoke to patient's mother [place] and updated her on patient's current condition and plan of care.  All questions and concerns have been addressed.  She verbalizes understanding and agreement with plan.  Disposition: Status is: Inpatient  Remains inpatient appropriate because: For evaluation of mental status changes  Planned Discharge Destination: Unclear disposition at this time    Time spent: 40 minutes  Author: Collier Bullock, MD 06/07/2022 11:37 AM  For on call review [email or web address].

## 2022-06-07 NOTE — Progress Notes (Addendum)
PT Cancellation Note  Patient Details Name: [name] [name] MRN: [id number] DOB: 07/29/1965   Cancelled Treatment:     PT attempt. Pt asleep upon entry and barely awakes. Author discussed need for OOB activity and concerns with DC to home. Author will return later this date to progress pt with OOB activity. Discussed pt's lethargy with MD. MD came and assessed. New orders placed. Author will return after CT scan and ABG.    Willette Pa 06/07/2022, 8:13 AM

## 2022-06-08 ENCOUNTER — Inpatient Hospital Stay: Payer: 59

## 2022-06-08 DIAGNOSIS — R4182 Altered mental status, unspecified: Secondary | ICD-10-CM | POA: Diagnosis not present

## 2022-06-08 DIAGNOSIS — G928 Other toxic encephalopathy: Secondary | ICD-10-CM | POA: Diagnosis not present

## 2022-06-08 DIAGNOSIS — G9341 Metabolic encephalopathy: Secondary | ICD-10-CM | POA: Diagnosis not present

## 2022-06-08 DIAGNOSIS — L089 Local infection of the skin and subcutaneous tissue, unspecified: Secondary | ICD-10-CM | POA: Diagnosis not present

## 2022-06-08 HISTORY — DX: Metabolic encephalopathy: G93.41

## 2022-06-08 LAB — GLUCOSE, CAPILLARY
Glucose-Capillary: 108 mg/dL — ABNORMAL HIGH (ref 70–99)
Glucose-Capillary: 111 mg/dL — ABNORMAL HIGH (ref 70–99)
Glucose-Capillary: 111 mg/dL — ABNORMAL HIGH (ref 70–99)
Glucose-Capillary: 93 mg/dL (ref 70–99)
Glucose-Capillary: 94 mg/dL (ref 70–99)
Glucose-Capillary: 99 mg/dL (ref 70–99)

## 2022-06-08 LAB — BLOOD GAS, ARTERIAL
Acid-Base Excess: 2.9 mmol/L — ABNORMAL HIGH (ref 0.0–2.0)
Bicarbonate: 23.8 mmol/L (ref 20.0–28.0)
Patient temperature: 37
pCO2 arterial: 26 mmHg — ABNORMAL LOW (ref 32–48)
pH, Arterial: 7.57 — ABNORMAL HIGH (ref 7.35–7.45)
pO2, Arterial: 163 mmHg — ABNORMAL HIGH (ref 83–108)

## 2022-06-08 LAB — CORTISOL: Cortisol, Plasma: 8.1 ug/dL

## 2022-06-08 LAB — BASIC METABOLIC PANEL
Anion gap: 5 (ref 5–15)
BUN: 37 mg/dL — ABNORMAL HIGH (ref 6–20)
CO2: 22 mmol/L (ref 22–32)
Calcium: 7.8 mg/dL — ABNORMAL LOW (ref 8.9–10.3)
Chloride: 110 mmol/L (ref 98–111)
Creatinine, Ser: 1.11 mg/dL — ABNORMAL HIGH (ref 0.44–1.00)
GFR, Estimated: 58 mL/min — ABNORMAL LOW (ref >60)
Glucose, Bld: 107 mg/dL — ABNORMAL HIGH (ref 70–99)
Potassium: 4 mmol/L (ref 3.5–5.1)
Sodium: 137 mmol/L (ref 135–145)

## 2022-06-08 LAB — CK: Total CK: 13 U/L — ABNORMAL LOW (ref 38–234)

## 2022-06-08 LAB — HEMOGLOBIN AND HEMATOCRIT, BLOOD
HCT: 24.1 % — ABNORMAL LOW (ref 36.0–46.0)
Hemoglobin: 7.9 g/dL — ABNORMAL LOW (ref 12.0–15.0)

## 2022-06-08 MED ORDER — PROCHLORPERAZINE EDISYLATE 10 MG/2ML IJ SOLN
10.0000 mg | Freq: Four times a day (QID) | INTRAMUSCULAR | Status: DC | PRN
  Administered 2022-06-08: 10 mg via INTRAVENOUS
  Filled 2022-06-08 (×2): qty 2

## 2022-06-08 MED ORDER — SODIUM CHLORIDE 0.9 % IV SOLN: 12.5000 mg | Freq: Three times a day (TID) | INTRAVENOUS | Status: DC | PRN

## 2022-06-08 MED ORDER — HALOPERIDOL 5 MG PO TABS: 5.0000 mg | ORAL_TABLET | Freq: Every day | ORAL | Status: DC | PRN

## 2022-06-08 MED ORDER — HALOPERIDOL 5 MG PO TABS: 5.0000 mg | ORAL_TABLET | Freq: Every day | ORAL | Status: DC

## 2022-06-08 MED ORDER — METOCLOPRAMIDE HCL 5 MG PO TABS
10.0000 mg | ORAL_TABLET | Freq: Four times a day (QID) | ORAL | Status: DC
  Administered 2022-06-08: 10 mg via ORAL
  Filled 2022-06-08: qty 2

## 2022-06-08 NOTE — Plan of Care (Signed)
  Problem: Activity: Goal: Risk for activity intolerance will decrease Outcome: Progressing   Problem: Elimination: Goal: Will not experience complications related to bowel motility Outcome: Progressing Goal: Will not experience complications related to urinary retention Outcome: Progressing   Problem: Safety: Goal: Ability to remain free from injury will improve Outcome: Progressing   

## 2022-06-08 NOTE — Progress Notes (Signed)
   Date of Admission:  05/21/2022     ID: [name] [name] is a 57 y.o. female  Principal Problem:   AMS (altered mental status) Active Problems:   GERD (gastroesophageal reflux disease)   Depression   Essential hypertension   AKI (acute kidney injury) ([place])   Cellulitis of right foot   Severe sepsis ([name])   Septic shock ([name])   Abscess of right foot   Morbid obesity ([place])   Right foot infection  Pt with multiple fusion surgeries to the spine, rt reverse shoulder arthroplasty, rt TMA was admitted for altered mental status and infection at the TMA site- CT scan did no show any osteo underwent I/D on 05/22/22 MRSA in culture ( previously it had been MSSA)    Subjective: Pt is doing much better Awake and alert Says she has nausea     Medications:   busPIRone  15 mg Oral TID   Chlorhexidine Gluconate Cloth  6 each Topical Q0600   Chlorhexidine Gluconate Cloth  6 each Topical Q0600   escitalopram  10 mg Oral Daily   estradiol  1 mg Oral QHS   lamoTRIgine  100 mg Oral Daily   sodium chloride flush  10-40 mL Intracatheter Q12H   sodium chloride flush  3 mL Intravenous Q12H   topiramate  50 mg Oral BID    Objective: Vital signs in last 24 hours: Patient Vitals for the past 24 hrs:  BP Temp Pulse Resp SpO2  06/08/22 0742 (!) 144/82 98 F (36.7 C) (!) 59 16 94 %  06/08/22 0025 (!) 142/81 98.1 F (36.7 C) 62 16 98 %       PHYSICAL EXAM:  Awake and alert- responding to questions  Chest B/l air entry [id number] Abd soft Rt foot TMA site is well coapted- no erythema or discharge   Swelling and erythema much improved Some dried blood on the dressing from her walking on that foot     Lab Results Recent Labs    06/07/22 0650 06/07/22 1648 06/08/22 0605  WBC 5.8  --   --   HGB 7.4*  --   --   HCT 23.2*  --   --   NA 139 137 137  K 4.4 4.3 4.0  CL 111 110 110  CO2 24 22 22  $ BUN 44* 39* 37*  CREATININE 1.08* 1.07* 1.11*    Microbiology: MRSA from 05/22/22  culture 05/27/22 surgical culture NG Studies/Results: Xray rt foot- no osteo  Pathology from 05/27/22 of the bone no acute osteo   Assessment/Plan: Rt TMA site infection- underwent I/D and has MRSA infection- was on vanco Went back for revision TMA on 05/27/22 culture NG and pathology no acute osteo As patient was having AKI ,DC vanco As no osteo - she will not need IV antibiotic Doxy PO is oral option- but syncope /coma listed for tetracycline Bactrim cannot be used because of AKI Cipro rash Linezolid cannot be used because of multiple SSRI Pt completed 12 days of Dapto and it has been DC  Surgical site looking good She should not walk on it- nursnign to discuss with podiatrist regarding special boot  Encephalopathy- resolved   Could be from Opioids compounded by AKI hAs been Dc On triple SSRI OSA-   AKI- resolved combination of incomplete emptying ( bladder scan 600cc) vanco/contrast-  started IV fluids  H/o multiple surgeries to spine  Depression/anxiety on meds  Discussed the management with  her nurse and care team

## 2022-06-08 NOTE — Progress Notes (Signed)
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    PT Comments    Pt was much more alert than previously observed earlier in the day. She still presents with flat affect and overall lethargy. Pt endorses wanting to have BM. Author retreive's [facility] however pt insist on using regular BR/toilet. Pt was able to exit bed with CGA. Heavy use of bed functions noted. Sat EOB without assistance prior to stand to RW with CGA. Pt does not adhere to wt bearing restrictions throughout session. Author strongly recommended pt adhere to NWB for proper healing per MD orders. " He said I can walk on my heel." Pt ambulated to the BR with RW. Successful large BM noted with pt requiring extensive assistance for hygiene care afterwards. Author elected to roll recliner to BR and pt stood pivot from toilet to maintain NWB. Author educated pt on importance of increasing OOB activity and encouraged increased food intake. Noted pt did eat very little breakfast and lunch if any at all. RN staff aware. Pt currently refusing rehab at DC. Did confirm with pt's mother the plan of pt discharging with her and grandson providing assistance. Acute PT will continue to follow. Recommend HHPT at DC if plan remains to DC home once deemed medically stable.      Recommendations for follow up therapy are one component of a multi-disciplinary discharge planning process, led by the attending physician.  Recommendations may be updated based on patient status, additional functional criteria and insurance authorization.  Follow Up Recommendations  Follow physician's  recommendations for discharge plan and follow up therapies     Assistance Recommended at Discharge Frequent or constant Supervision/Assistance  Patient can return home with the following A little help with walking and/or transfers;A little help with bathing/dressing/bathroom;Assistance with cooking/housework;Direct supervision/assist for medications management;Direct supervision/assist for financial management;Assist for transportation;Help with stairs or ramp for entrance   Equipment Recommendations  None recommended by PT (Pt endorses haviong all equipment needs met. Encouraged hospital bed however pt currently refusing)       Precautions / Restrictions Precautions Precautions: Fall Restrictions Weight Bearing Restrictions: Yes RLE Weight Bearing: Non weight bearing     Mobility  Bed Mobility Overal bed mobility: Needs Assistance Bed Mobility: Supine to Sit  Supine to sit: Min guard  General bed mobility comments: Pt was able to exit L side of bed without physical lifting assistance however she requires heavy use of bed functions. Discussed possibility of hospital bed at home but pt refuses." I dont need a hospital bed and there isnt enough room."    Transfers Overall transfer level: Needs assistance Equipment used: Rolling walker (2 wheels) Transfers: Sit to/from Stand Sit to Stand: Min guard General transfer comment: Pt stood form EOB to RW with CGA. pt does not attempt to limit wt off LE even when encouraged to.    Ambulation/Gait Ambulation/Gait assistance: Min guard Assistive device: Rolling walker (2 wheels) Gait Pattern/deviations: Step-to pattern Gait velocity: decreased     General Gait Details: Pt proceeds away from EOB slightly impulsively due to erge to use restroom. Does not adhere to wt bearing restrictions even when encouraged to do so. Endorses no pain. "Dr told me  I can put wt on my heel." Once pt was in BR. Author elects to have pt stand pivot from toilet  to recliner so pt will remain NWB. Will clarify orders however will maintain NWB until other orders are in place.    Balance Overall balance assessment: Needs assistance Sitting-balance support: No upper extremity supported Sitting balance-Leahy Scale: Good     Standing balance support: Bilateral upper extremity supported Standing balance-Leahy Scale: Fair     Cognition Arousal/Alertness: Lethargic, Awake/alert Behavior During Therapy: Flat affect Overall Cognitive Status: Within Functional Limits for tasks assessed      General Comments: Pt was observed to be much alert this afternoon than previously observed earlier in the day. she still presents with flat affect and overall lethargy          PT Goals (current goals can now be found in the care plan section) Acute Rehab PT Goals Patient Stated Goal: go home when I feel better    Frequency    7X/week      PT Plan Current plan remains appropriate       AM-PAC PT "6 Clicks" Mobility   Outcome Measure  Help needed turning from your back to your side while in a flat bed without using bedrails?: None Help needed moving from lying on your back to sitting on the side of a flat bed without using bedrails?: A Little Help needed moving to and from a bed to a chair (including a wheelchair)?: A Little Help needed standing up from a chair using your arms (e.g., wheelchair or bedside chair)?: A Little Help needed to walk in hospital room?: A Lot Help needed climbing 3-5 steps with a railing? : Total 6 Click Score: 16    End of Session   Activity Tolerance: Patient limited by fatigue Patient left: in chair;with call bell/phone within reach;with chair alarm set Nurse Communication: Mobility status PT Visit Diagnosis: Muscle weakness (generalized) (M62.81);Difficulty in walking, not elsewhere classified (R26.2)     Time:  -     Charges:                        Julaine Fusi PTA 06/08/22, 7:01 AM

## 2022-06-08 NOTE — Consult Note (Signed)
[facility] Consult   Reason for Consult:  lethargy/flat affect Referring Physician:  Agbata Patient Identification: [name] [name] MRN:  [id number] Principal Diagnosis: AMS (altered mental status) Diagnosis:  Principal Problem:   AMS (altered mental status) Active Problems:   Depression   GERD (gastroesophageal reflux disease)   Essential hypertension   AKI (acute kidney injury) ([place])   Cellulitis of right foot   Severe sepsis ([place])   Septic shock ([place])   Abscess of right foot   Morbid obesity ([place])   Right foot infection   Total Time spent with patient: 45 minutes  Subjective: "My stomach hurts"   [name] [name] is a 57 y.o. female patient admitted with cellulitis of right foot. Consult today due to somnolence, concern for worsening depression  HPI:  Patient seen on medical floor, where she is being treated for sepsis. Patient has a history of bipolar depression, taking Bupsar, Cymbalta, Lexapro, prazosin, Trazodone, Haldol.  Prazosin, Cymbalta, Haldol, along with Lyrica,  were discontinued as a precaution as it was thought they may be contributing to patient's lethargy.  On evaluation, patient is lethargic, but opens eyes periodically, becomes alert enough to answer questions appropriately. She is oriented appropriately. Writer introduced self and gave reason for my visit. Patient denies worsening depression, saying that she feels physically terrible and that "my stomach hurts so bad." Regulatory affairs officer made RN aware). Patient recently got some medication for this.  Patient denies suicidal thoughts or intent. Patient denies auditory or visual hallucinations, but admits that she has had hallucinations in the past. When asked if she takes Haldol to help with these she reports that she does take Haldol on a regular basis.   Collateral from mother, [place], [phone]. Mother reports that patient has been doing well from a mental health standpoint. Discussed that we  are evaluating mental health because of her increased lethargy. Discussed that some of the medications were taken off because they could cause her to be more lethargic. Mother concerned about the discontinuation of Haldol, as she states she can really start to have hallucinations without it. Mother reports that she just left the hospital and she feels that patient is "just really physically sick."   Writer discussed with Dr. Francine Graven via secure chat. Decided to add Haldol 5 mg once daily as needed for hallucinations.      Past Psychiatric History: Per chart review, Hospitalization in 2013, Bipolar Disorder and suicidal ideation. Received ECT at [facility]. Hx of  Borderline Personality Disorder.   Risk to Self:   Risk to Others:   Prior Inpatient Therapy:   Prior Outpatient Therapy:    Past Medical History:  Past Medical History:  Diagnosis Date   Anxiety    Arthritis    Phreesia 02/08/2020   Asthma    mild intermittent   Asthma    Phreesia 02/08/2020   Bipolar 1 disorder ([place])    ect treatments last treatment Sep 02 1011   Depression    Depression    Phreesia 02/08/2020   Depression    Phreesia 07/10/2020   GERD (gastroesophageal reflux disease)    Headache    Hypertension    Pre-diabetes    Seizures ([place])    Thinks it was from taking Tramadol   Sleep apnea    wears CPAP, uncertain of setting   Substance abuse ([place])    Phreesia 02/08/2020    Past Surgical History:  Procedure Laterality Date   ABDOMINAL HYSTERECTOMY  AMPUTATION TOE Left 02/02/2018   Procedure: AMPUTATION TOE Left 4th toe;  Surgeon: [name], DPM;  Location: [facility];  Service: Podiatry;  Laterality: Left;   APPENDECTOMY N/A    Phreesia 02/08/2020   BACK SURGERY  2018   ACDF   C5-6 & C6-7 by Dr. Rennis Harding   CARPAL TUNNEL RELEASE     x2   I & D EXTREMITY Right 05/22/2022   Procedure: IRRIGATION AND DEBRIDEMENT EXTREMITY;  Surgeon: Felipa Furnace, DPM;  Location: [facility];   Service: Podiatry;  Laterality: Right;   INCISION AND DRAINAGE Right 05/24/2022   Procedure: RIGHT REVISION INCISION AND DRAINAGE/ WASHOUT WITH PRIMARY DELAYED CLOSURE;  Surgeon: [name], DPM;  Location: [facility];  Service: Podiatry;  Laterality: Right;   IRRIGATION AND DEBRIDEMENT ABSCESS Right 10/25/2020   Procedure: IRRIGATION AND DEBRIDEMENT ABSCESS OF FOOT AND APPLICATION OF GRAFT;  Surgeon: [name], DPM;  Location: [facility];  Service: Podiatry;  Laterality: Right;   Laproscopic knee surgery Right    NECK SURGERY  03/15/2017   ACDF   by Dr. Patrice Paradise   PLANTAR FASCIA RELEASE     x2   REVERSE SHOULDER ARTHROPLASTY Right 04/14/2022   Procedure: REVERSE SHOULDER ARTHROPLASTY;  Surgeon: Hiram Gash, MD;  Location: [facility];  Service: Orthopedics;  Laterality: Right;   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   Procedure: TRANSMETATARSAL AMPUTATION;  Surgeon: [name], DPM;  Location: [facility];  Service: Podiatry;  Laterality: Right;   TRANSMETATARSAL AMPUTATION Right 05/27/2022   Procedure: REVISION TRANSMETATARSAL AMPUTATION WITH DELAYED PRIMARY CLOSURE;  Surgeon: [name], DPM;  Location: [facility];  Service: Podiatry;  Laterality: Right;   Family History:  Family History  Problem Relation Age of Onset   Diabetes Mother    COPD Mother    Hypertension Mother    Hyperlipidemia Mother    Sleep apnea Mother    Heart disease Father    Hyperlipidemia Father    Hypertension Father    Cancer Father    Sleep apnea Father    Heart disease Brother    Sleep apnea Brother    Heart disease Maternal Grandmother    Hypertension Maternal Grandmother    Sleep apnea Maternal Grandmother    Heart disease Maternal Grandfather    Kidney cancer Paternal Grandmother    Heart disease Paternal Grandfather    Heart disease Daughter    Family Psychiatric  History:  Social History:  Social History   Substance and Sexual Activity  Alcohol Use No     Social History    Substance and Sexual Activity  Drug Use No    Social History   Socioeconomic History   Marital status: Widowed    Spouse name: Not on file   Number of children: 3   Years of education: 39   Highest education level: Not on file  Occupational History   Occupation: Disabled  Tobacco Use   Smoking status: Never   Smokeless tobacco: Never  Vaping Use   Vaping Use: Never used  Substance and Sexual Activity   Alcohol use: No   Drug use: No   Sexual activity: Not Currently  Other Topics Concern   Not on file  Social History Narrative   Patient drink 4 16oz caffeine drinks a day    Social Determinants of Health   Financial Resource Strain: Low Risk  (08/21/2020)   Overall Financial Resource Strain (CARDIA)    Difficulty of Paying Living Expenses: Not  hard at all  Food Insecurity: No Food Insecurity (05/22/2022)   Hunger Vital Sign    Worried About Running Out of Food in the Last Year: Never true    Ran Out of Food in the Last Year: Never true  Transportation Needs: No Transportation Needs (05/22/2022)   PRAPARE - Hydrologist (Medical): No    Lack of Transportation (Non-Medical): No  Physical Activity: Inactive (08/21/2020)   Exercise Vital Sign    Days of Exercise per Week: 0 days    Minutes of Exercise per Session: 0 min  Stress: No Stress Concern Present (08/21/2020)   [place]    Feeling of Stress : Not at all  Social Connections: Socially Isolated (08/21/2020)   Social Connection and Isolation Panel [NHANES]    Frequency of Communication with Friends and Family: More than three times a week    Frequency of Social Gatherings with Friends and Family: Twice a week    Attends Religious Services: Never    Marine scientist or Organizations: No    Attends Archivist Meetings: Never    Marital Status: Widowed   Additional Social History:    Allergies:   Allergies   Allergen Reactions   Other    Tetracyclines & Related Other (See Comments)    Syncope and put her "in a coma"   Tramadol Other (See Comments)    Seizures   Phenazopyridine Other (See Comments)    Unknown   Tetracycline Other (See Comments)    Syncope and "Put me in a coma"   Ciprofloxacin Rash and Itching   Codeine Itching and Rash   Estradiol Rash    Patches broke out the skin    Labs:  Results for orders placed or performed during the hospital encounter of 05/21/22 (from the past 48 hour(s))  Glucose, capillary     Status: Abnormal   Collection Time: 06/06/22  5:26 PM  Result Value Ref Range   Glucose-Capillary 111 (H) 70 - 99 mg/dL    Comment: Glucose reference range applies only to samples taken after fasting for at least 8 hours.  Glucose, capillary     Status: None   Collection Time: 06/06/22 10:28 PM  Result Value Ref Range   Glucose-Capillary 94 70 - 99 mg/dL    Comment: Glucose reference range applies only to samples taken after fasting for at least 8 hours.   Comment 1 Notify RN   Basic metabolic panel     Status: Abnormal   Collection Time: 06/07/22  6:50 AM  Result Value Ref Range   Sodium 139 135 - 145 mmol/L   Potassium 4.4 3.5 - 5.1 mmol/L   Chloride 111 98 - 111 mmol/L   CO2 24 22 - 32 mmol/L   Glucose, Bld 94 70 - 99 mg/dL    Comment: Glucose reference range applies only to samples taken after fasting for at least 8 hours.   BUN 44 (H) 6 - 20 mg/dL   Creatinine, Ser 1.08 (H) 0.44 - 1.00 mg/dL   Calcium 7.8 (L) 8.9 - 10.3 mg/dL   GFR, Estimated 60 (L) >60 mL/min    Comment: (NOTE) Calculated using the CKD-EPI Creatinine Equation (2021)    Anion gap 4 (L) 5 - 15    Comment: Performed at [facility], [address], [place],  [address]  CBC     Status: Abnormal   Collection Time: 06/07/22  6:50 AM  Result Value Ref Range   WBC 5.8 4.0 - 10.5 K/uL   RBC 2.37 (L) 3.87 - 5.11 MIL/uL   Hemoglobin 7.4 (L) 12.0 - 15.0 g/dL   HCT 23.2  (L) 36.0 - 46.0 %   MCV 97.9 80.0 - 100.0 fL   MCH 31.2 26.0 - 34.0 pg   MCHC 31.9 30.0 - 36.0 g/dL   RDW 14.1 11.5 - 15.5 %   Platelets 222 150 - 400 K/uL   nRBC 0.0 0.0 - 0.2 %    Comment: Performed at [facility], [address], [place], [place] [address]  Magnesium     Status: Abnormal   Collection Time: 06/07/22  6:50 AM  Result Value Ref Range   Magnesium 3.0 (H) 1.7 - 2.4 mg/dL    Comment: Performed at [facility], [address] [place], [place] [address]  Hepatic function panel     Status: Abnormal   Collection Time: 06/07/22  6:50 AM  Result Value Ref Range   Total Protein 5.4 (L) 6.5 - 8.1 g/dL   Albumin 2.3 (L) 3.5 - 5.0 g/dL   AST 11 (L) 15 - 41 U/L   ALT 10 0 - 44 U/L   Alkaline Phosphatase 62 38 - 126 U/L   Total Bilirubin 0.6 0.3 - 1.2 mg/dL   Bilirubin, Direct 0.1 0.0 - 0.2 mg/dL   Indirect Bilirubin 0.5 0.3 - 0.9 mg/dL    Comment: Performed at [facility], [address] [place], [place] [address]  Glucose, capillary     Status: None   Collection Time: 06/07/22  7:42 AM  Result Value Ref Range   Glucose-Capillary 86 70 - 99 mg/dL    Comment: Glucose reference range applies only to samples taken after fasting for at least 8 hours.  Glucose, capillary     Status: Abnormal   Collection Time: 06/07/22 11:00 AM  Result Value Ref Range   Glucose-Capillary 106 (H) 70 - 99 mg/dL    Comment: Glucose reference range applies only to samples taken after fasting for at least 8 hours.  Blood gas, arterial     Status: Abnormal   Collection Time: 06/07/22 11:02 AM  Result Value Ref Range   pH, Arterial 7.46 (H) 7.35 - 7.45   pCO2 arterial 34 32 - 48 mmHg   pO2, Arterial 89 83 - 108 mmHg   Bicarbonate 24.2 20.0 - 28.0 mmol/L   Acid-Base Excess 1.0 0.0 - 2.0 mmol/L   O2 Saturation 98.8 %   Patient temperature 37.0    Allens test (pass/fail) PASS PASS    Comment: Performed at [facility], [address] [place], [place]  [address]  Glucose, capillary     Status: Abnormal   Collection Time: 06/07/22  4:42 PM  Result Value Ref Range   Glucose-Capillary 113 (H) 70 - 99 mg/dL    Comment: Glucose reference range applies only to samples taken after fasting for at least 8 hours.  Basic metabolic panel     Status: Abnormal   Collection Time: 06/07/22  4:48 PM  Result Value Ref Range   Sodium 137 135 - 145 mmol/L   Potassium 4.3 3.5 - 5.1 mmol/L   Chloride 110 98 - 111 mmol/L   CO2 22 22 - 32 mmol/L   Glucose, Bld 131 (H) 70 - 99 mg/dL    Comment: Glucose reference range applies only to samples taken after fasting for at least 8 hours.  BUN 39 (H) 6 - 20 mg/dL   Creatinine, Ser 1.07 (H) 0.44 - 1.00 mg/dL   Calcium 7.9 (L) 8.9 - 10.3 mg/dL   GFR, Estimated >60 >60 mL/min    Comment: (NOTE) Calculated using the CKD-EPI Creatinine Equation (2021)    Anion gap 5 5 - 15    Comment: Performed at [facility], [address] [place], [place] [address]  Ammonia     Status: None   Collection Time: 06/07/22  4:51 PM  Result Value Ref Range   Ammonia 26 9 - 35 umol/L    Comment: Performed at [facility], [address] [place], [place] [address]  TSH     Status: None   Collection Time: 06/07/22  5:19 PM  Result Value Ref Range   TSH 2.259 0.350 - 4.500 uIU/mL    Comment: Performed by a 3rd Generation assay with a functional sensitivity of <=0.01 uIU/mL. Performed at [facility], [address] [place], [place] [address]   Glucose, capillary     Status: Abnormal   Collection Time: 06/07/22  8:57 PM  Result Value Ref Range   Glucose-Capillary 147 (H) 70 - 99 mg/dL    Comment: Glucose reference range applies only to samples taken after fasting for at least 8 hours.  Glucose, capillary     Status: Abnormal   Collection Time: 06/08/22 12:26 AM  Result Value Ref Range   Glucose-Capillary 108 (H) 70 - 99 mg/dL    Comment: Glucose reference range applies only to samples taken after  fasting for at least 8 hours.  Glucose, capillary     Status: Abnormal   Collection Time: 06/08/22  4:39 AM  Result Value Ref Range   Glucose-Capillary 111 (H) 70 - 99 mg/dL    Comment: Glucose reference range applies only to samples taken after fasting for at least 8 hours.  CK     Status: Abnormal   Collection Time: 06/08/22  6:05 AM  Result Value Ref Range   Total CK 13 (L) 38 - 234 U/L    Comment: Performed at [facility], [address] [place], [place] [id number]  Basic metabolic panel     Status: Abnormal   Collection Time: 06/08/22  6:05 AM  Result Value Ref Range   Sodium 137 135 - 145 mmol/L   Potassium 4.0 3.5 - 5.1 mmol/L   Chloride 110 98 - 111 mmol/L   CO2 22 22 - 32 mmol/L   Glucose, Bld 107 (H) 70 - 99 mg/dL    Comment: Glucose reference range applies only to samples taken after fasting for at least 8 hours.   BUN 37 (H) 6 - 20 mg/dL   Creatinine, Ser 1.11 (H) 0.44 - 1.00 mg/dL   Calcium 7.8 (L) 8.9 - 10.3 mg/dL   GFR, Estimated 58 (L) >60 mL/min    Comment: (NOTE) Calculated using the CKD-EPI Creatinine Equation (2021)    Anion gap 5 5 - 15    Comment: Performed at [facility], [address] [place], [place]  [address]  Cortisol     Status: None   Collection Time: 06/08/22  6:05 AM  Result Value Ref Range   Cortisol, Plasma 8.1 ug/dL    Comment: (NOTE) AM    6.7 - 22.6 ug/dL PM   <10.0       ug/dL Performed at [facility] [address] [place], [place] [address]   Glucose, capillary  Status: None   Collection Time: 06/08/22  7:42 AM  Result Value Ref Range   Glucose-Capillary 94 70 - 99 mg/dL    Comment: Glucose reference range applies only to samples taken after fasting for at least 8 hours.  Glucose, capillary     Status: None   Collection Time: 06/08/22 11:15 AM  Result Value Ref Range   Glucose-Capillary 93 70 - 99 mg/dL    Comment: Glucose reference range applies only to samples taken after fasting for at least  8 hours.   *Note: Due to a large number of results and/or encounters for the requested time period, some results have not been displayed. A complete set of results can be found in Results Review.    Current Facility-Administered Medications  Medication Dose Route Frequency Provider Last Rate Last Admin   0.9 %  sodium chloride infusion  250 mL Intravenous Continuous [name], DPM   Stopped at 05/24/22 1920   acetaminophen (TYLENOL) tablet 650 mg  650 mg Oral Q6H PRN [name], DPM   650 mg at 05/23/22 1655   Or   acetaminophen (TYLENOL) suppository 650 mg  650 mg Rectal Q6H PRN [name], DPM       albuterol (PROVENTIL) (2.5 MG/3ML) 0.083% nebulizer solution 3 mL  3 mL Nebulization Q4H PRN McDonald, Stephan Minister, DPM       busPIRone (BUSPAR) tablet 15 mg  15 mg Oral TID [name], DPM   15 mg at 06/07/22 2300   Chlorhexidine Gluconate Cloth 2 % PADS 6 each  6 each Topical Q0600 [name], DPM   6 each at 06/07/22 [id number]   Chlorhexidine Gluconate Cloth 2 % PADS 6 each  6 each Topical Q0600 Oran Rein, MD   6 each at 06/07/22 0647   dextrose 5 %-0.9 % sodium chloride infusion   Intravenous Continuous Agbata, Tochukwu, MD 40 mL/hr at 06/08/22 1305 New Bag at 06/08/22 1305   escitalopram (LEXAPRO) tablet 10 mg  10 mg Oral Daily [name], DPM   10 mg at 06/07/22 1023   estradiol (ESTRACE) tablet 1 mg  1 mg Oral QHS [name], DPM   1 mg at 06/07/22 2301   lamoTRIgine (LAMICTAL) tablet 100 mg  100 mg Oral Daily [name], DPM   100 mg at 06/07/22 1022   ondansetron (ZOFRAN) injection 4 mg  4 mg Intravenous Q6H PRN [name], DPM   4 mg at 06/07/22 2306   Oral care mouth rinse  15 mL Mouth Rinse PRN McDonald, Stephan Minister, DPM       prochlorperazine (COMPAZINE) injection 10 mg  10 mg Intravenous Q6H PRN Agbata, Tochukwu, MD       sodium chloride flush (NS) 0.9 % injection 10-40 mL  10-40 mL Intracatheter Q12H Oran Rein, MD   10 mL at 06/08/22  1020   sodium chloride flush (NS) 0.9 % injection 10-40 mL  10-40 mL Intracatheter PRN Oran Rein, MD       sodium chloride flush (NS) 0.9 % injection 3 mL  3 mL Intravenous Q12H McDonald, Adam R, DPM   3 mL at 06/07/22 2300   topiramate (TOPAMAX) tablet 50 mg  50 mg Oral BID [name], DPM   50 mg at 06/07/22 2301    Musculoskeletal: Strength & Muscle Tone: within normal limits Gait & Station:  did not observe Patient leans: N/A   Psychiatric Specialty Exam:  Presentation  General  Appearance: Appropriate for Environment (ill-appearing . On medical unit)  Eye Contact:Minimal (mostly has eyes closed during interview)  Speech:Clear and Coherent  Speech Volume:Normal  Handedness:No data recorded  Mood and Affect  Mood:-- ("I'm in pain, my stomach")  Affect:Blunt; Congruent   Thought Process  Thought Processes:Coherent  Descriptions of Associations:Intact  Orientation:Full (Time, Place and Person)  Thought Content:WDL  History of Schizophrenia/Schizoaffective disorder:No data recorded Duration of Psychotic Symptoms:No data recorded Hallucinations:Hallucinations: None  Ideas of Reference:None  Suicidal Thoughts:Suicidal Thoughts: No  Homicidal Thoughts:Homicidal Thoughts: No   Sensorium  Memory:Immediate Good  Judgment:Good  Insight:Good   Executive Functions  Concentration:Fair  Attention Span:Fair  [place]   Psychomotor Activity  Psychomotor Activity:No data recorded  Assets  Assets:Resilience; Social Support; Housing; Catering manager; Desire for Improvement   Sleep  Sleep:Sleep: Good   Physical Exam: Physical Exam Vitals and nursing note reviewed.  Constitutional:      Appearance: She is ill-appearing.  HENT:     Head: Normocephalic.     Nose: No congestion or rhinorrhea.  Eyes:     General:        Right eye: No discharge.        Left eye: No discharge.   Pulmonary:     Effort: Pulmonary effort is normal.  Musculoskeletal:     Cervical back: Normal range of motion.  Skin:    General: Skin is dry.  Neurological:     Mental Status: She is oriented to person, place, and time.  Psychiatric:        Attention and Perception: Attention normal.        Mood and Affect: Affect is blunt (staes she is in pain).        Speech: Speech normal.        Behavior: Behavior normal.        Thought Content: Thought content is not paranoid or delusional. Thought content does not include homicidal or suicidal ideation.        Cognition and Memory: Cognition normal.        Judgment: Judgment normal.     Comments: Pt is lethargic, but answers questions appropriately    Review of Systems  Constitutional:  Positive for malaise/fatigue.  HENT: Negative.    Respiratory: Negative.    Gastrointestinal:        Complains of stomach pain. Bedside RN is aware  Psychiatric/Behavioral:  Negative for depression, hallucinations, memory loss, substance abuse and suicidal ideas. The patient is not nervous/anxious and does not have insomnia.        Diagnosis of bipolar disorder. Responds to questions appropriately and denies current sx    Blood pressure (!) 144/82, pulse (!) 59, temperature 98 F (36.7 C), resp. rate 16, height 5' 4"$  (1.626 m), weight 104.3 kg, SpO2 94 %. Body mass index is 39.48 kg/m.  Treatment Plan Summary: Plan Recommend that Haldol be re-started, as patient been taking for hallucinations . Added Haldol 5 mg daily as needed for hallucinations.  Although she denies hallucinations at time of evaluation and does not appear  to be responding to internal stimuli, mother states that patient can start to have hallucinations if she does not take Haldol.  It does not appear that patient's somnolence is related to her psychiatric diagnosis or worsening depression at this time. She is continuing with medical work-up. We can continue to follow as needed.  Reviewed  with Dr. Francine Graven,.Another provider has already discontinued Cymbalta, Prazosin, and Trazodone prior  to this evaluation in an effort to ascertain if they had an influence on patient's lethargy.     Disposition: No evidence of imminent risk to self or others at present.   Patient does not meet criteria for psychiatric inpatient admission. Supportive therapy provided about ongoing stressors.  Sherlon Handing, NP 06/08/2022 2:04 PM

## 2022-06-08 NOTE — Procedures (Signed)
Patient Name: LANANH MILLAR  MRN: [id number]  Epilepsy Attending: Lora Havens  Referring Physician/Provider: Amie Portland, MD  Date: 06/08/2022  Duration: 30.18 mins  Patient history: 57yo F with ams. EEG to evaluate for seizure  Level of alertness: Awake  AEDs during EEG study: None  Technical aspects: This EEG study was done with scalp electrodes positioned according to the 10-20 International system of electrode placement. Electrical activity was reviewed with band pass filter of 1-70Hz$ , sensitivity of 7 uV/mm, display speed of 64m/sec with a 60Hz$  notched filter applied as appropriate. EEG data were recorded continuously and digitally stored.  Video monitoring was available and reviewed as appropriate.  Description: EEG showed continuous generalized 3 to 6 Hz theta-delta slowing. Hyperventilation and photic stimulation were not performed.     Of note, study was technically difficult due to significant myogenic artifact.  ABNORMALITY - Continuous slow, generalized  IMPRESSION: This technically difficult study is suggestive of moderate to severe diffuse encephalopathy, nonspecific etiology. No seizures or epileptiform discharges were seen throughout the recording.   Harvel Meskill OBarbra Sarks

## 2022-06-08 NOTE — Procedures (Signed)
Will do eeg when pt finished with mri

## 2022-06-08 NOTE — Progress Notes (Addendum)
Progress Note   Patient: [name] [name] [id number] DOB: Jun 20, 1965 DOA: 05/21/2022     17 DOS: the patient was seen and examined on 06/08/2022   Brief hospital course: Rhiannon Brien is 57 year old female with history of bipolar disorder, total arthroplasty right shoulder December 2023, chronic pain syndrome, anxiety depression, who presented to emergency room 05/21/22 with altered mental status and redness of the right foot amputation stump, was on Keflex at home, TMA 2 mos prior.  02/02: to ED as above. Septic shock, required brief levo gtt. 02/03: admitted to hospitalist service, underwent I&D w/ podiatry 02/04: stable on IV abx awaiting Cx 02/05: back to OR for revision I&D.   02/06-02/07: stable, planning back to OR 02/08. Cultures from 02/03 (+)MRSA 02/08: to OR for revision of RLE TMA. Per ID, continue vanco and send bone for pathology and culture  02/09: podiatry and ID recs for continue inpatient for now to ensure surgical stabilization and await culture results to guide abx, will likely be here thru the weekend (today is Friday)   02/10-02/11: stable, awaiting cultures 02/12: Surgical pathology resulted: no osteomyelitis. Cr jumped up today, pt reports no urine yesterday. IV fluids. UA no infection, FeNa prerenal. ID and pharmacy following and adjusting abx and other possible nephrotoxic medications. 600 mL UOP today, Cr worsening this afternoon, will give additional 1L fluids, nephrology consulted.  02/13: minimal improvement Cr, worsening hyponatremia, nephrology recs pending. 02/14: Continues to complain about inability to void.  Bladder scan done at noon had 541 cc of urine.  Serum creatinine peaked at 3.56 and today it is 2.75.  Will place Foley catheter 2/15  : Patient seen and examined at bedside lethargic in bed.  Continues to stay on daptomycin pending ID recommendations for further continuation of medication.  2/16 : Patient seen and examined this morning complaining of not  feeling well.  No acute overnight events.  Vital stable.  Labs with persistent hyponatremia with sodium of 125.  Renal function with improvement creatinine 1.7.  Patient continued on daptomycin with last dose on 2/18 as per infectious disease Patient was seen and examined this morning.  No overnight events.  Was lethargic at the time of my evaluation.  Hyponatremia improving. Patient pending PICC line placement for frequent blood draws.holding Lyrica as pt has been lethargic and somnolent.  Patient seen and examined this morning.  Still lethargic no overnight events reported.  Patient had PICC line placed for frequent blood draws.  Patient will have last dose of daptomycin today on 2/18 as per ID.  PT needs to evaluate the patient for safe discharge.  Patient is from home initial plan was to return to home however because of deconditioning would need higher level of care at the time of discharge. 2/19: Patient is seen and examined during rounds and remains very lethargic.  Arouses to name call.  No oral intake.  Blood sugar at bedside 106.  Lyrica has been on hold and will also place Cymbalta on hold as well. 2/20: Patient is seen and examined during rounds.  She is more awake this morning and complains of abdominal pain mostly in the periumbilical area and nausea.  Last bowel movement was 02/19    Assessment and Plan:  Principal Problem:   Cellulitis of right foot Active Problems:   Severe sepsis ([name])   GERD (gastroesophageal reflux disease)   Essential hypertension   AMS (altered mental status)   Septic shock ([name])   Abscess of right foot   Septic  shock - POA - resolved  Right foot cellulitis and abscess at site of prior TMA with history of prior ulceration (+)MRSA S/p return to the OR on 05/27/2022 for repeat washout, revisional amputation and resection of necrotic bone and soft tissue and primary closure of the amputation site. Surgical path no osteomyelitis   Wound culture yielded MRSA.   Patient has been on IV vancomycin and last dose was on 05/29/22.  Vancomycin was discontinued due to AKI Started on daptomycin on 06/02/22 last dose was on 2/18.    AKI with hyperkalemia and hyponatremia Sharp increase in Cr.  AKI is thought to be multifactorial and related to IV contrast and possible hypotension during surgical procedure as well as poor oral intake. Serum creatinine peaked-3.5 and shows a downward trend 3.5 >> 1.08 >> 1.11.  Patient's baseline is about 0.56 Continue IV fluid hydration.  Improvement in serum sodium levels with hydration Avoid nephrotoxic agents   Depression anxiety bipolar disorder history of seizure disorder cont Buspar and Lexapro  cont lamotrigine and Topamax   Chronic pain syndrome recent right shoulder arthroplasty Lyrica and Percocet are on hold due to metabolic encephalopathy   Morbid obesity, BMI [id number] Complicates treatment and prognosis   Sleep apnea CPAP nightly   Anemia (acute on chronic) Patient with baseline hemoglobin of 10 which has dropped to 7.4 No obvious source of bleeding at this time Aspirin and Lovenox have been on hold Will transfuse for hemoglobin less than 7 Serum iron is within normal limits     Acute metabolic encephalopathy Patient remains very lethargic Had a CT scan of the head without contrast which did not show any acute findings.  ABG shows respiratory alkalosis Percocet and Lyrica on hold due to sedation EEG is pending Will consult neurology for further evaluation and recommendation Psych consult to review meds    Nausea/Vomiting. ??  Diabetic gastroparesis Patient not on Reglan due to history of tardive dyskinesia No improvement with Reglan Will place patient on Compazine.       Physical Exam: Vitals:   06/07/22 0743 06/07/22 1412 06/08/22 0025 06/08/22 0742  BP: 133/73 (!) 154/84 (!) 142/81 (!) 144/82  Pulse: 64 69 62 (!) 59  Resp: 16 17 16 16  $ Temp: 98.1 F (36.7 C) 98.3 F (36.8 C) 98.1  F (36.7 C) 98 F (36.7 C)  TempSrc:      SpO2: 97% 97% 98% 94%  Weight:      Height:       Physical Exam Vitals and nursing note reviewed.  Constitutional:      Comments: Chronically ill-appearing.  Lethargic but arouses easily  HENT:     Head: Normocephalic and atraumatic.     Nose: Nose normal.     Mouth/Throat:     Mouth: Mucous membranes are dry.  Eyes:     Conjunctiva/sclera: Conjunctivae normal.  Cardiovascular:     Rate and Rhythm: Normal rate and regular rhythm.  Pulmonary:     Effort: Pulmonary effort is normal.     Breath sounds: Normal breath sounds.  Abdominal:     General: Bowel sounds are normal.     Palpations: Abdomen is soft.     Comments: Periumbilical tenderness  Musculoskeletal:     Cervical back: Normal range of motion and neck supple.  Skin:    General: Skin is warm and dry.  Neurological:     Motor: Weakness present.  Psychiatric:     Comments: Flat affect     Data Reviewed:  Labs reviewed.  Sodium 137, potassium 4.0, chloride 110, bicarb 22, glucose 107, BUN 37, creatinine 1.11, calcium 7.8, total CK13, TSH 2.259 There are no new results to review at this time.  Family Communication: Discussed patient's condition and plan of care with daughter over the phone.  Left a voicemail for her mother [place], awaiting callback  Disposition: Status is: Inpatient Remains inpatient appropriate because: Evaluation of acute metabolic encephalopathy  Planned Discharge Destination: Unclear disposition at this time    Time spent: 35 minutes  Author: Collier Bullock, MD 06/08/2022 3:06 PM  For on call review [email or web address].

## 2022-06-08 NOTE — Progress Notes (Signed)
PT Cancellation Note  Patient Details Name: [name] [name] MRN: [id number] DOB: Jun 28, 1965   Cancelled Treatment:    PT attempt. 2nd attempt today. Pt is currently off floor for MRI. EEG to follow. Acute PT will continue to follow and progress as able per current POC.    Willette Pa 06/08/2022, 1:57 PM

## 2022-06-08 NOTE — Consult Note (Signed)
Neurology Consultation  Reason for Consult: Altered mental status Referring Physician: Dr. Francine Graven  CC: Altered mental status  History is obtained from: Chart, patient  HPI: [name] [name] is a 57 y.o. female past history of anxiety, depression, chronic pain secondary to multiple surgeries to the spine, shoulder, bipolar disorder, substance use disorder, documented history of seizure disorder with last seizure many years ago on lamotrigine and topiramate for both seizures and headache and bipolar disorder, obesity, obstructive sleep apnea, right foot osteomyelitis status post TMA and infection is the TMA site along with altered mental status for which she was admitted on early part of this month, remains altered in spite of treatment for her toxic and metabolic derangements for which neurological consultation was obtained. Patient says that she feels nauseous and does not feel like herself.  She has not felt like herself for a while.  She could not clearly tell me what was different.  She seems to be very slow to respond to questions and keeps falling asleep during the encounter. Review of the progress note from the hospitalist from yesterday mentions that she is barely arousable and nods her head to questions yes or no.  She is on antibiotics for the right TMA site infection, had AKI, vancomycin was discontinued, no evidence of osteomyelitis on CT hence IV antibiotics were discontinued and last dose of her antibiotic was daptomycin on 06/06/2022 per ID.  She remains lethargic with improvement in her hyponatremia and AKI.  Her Lyrica has been on hold and her Cymbalta was held yesterday as well. Due to history of sleep apnea, ABG was checked which is unremarkable.  She is on CPAP. On chronic opiates-not been on opiates since 06/04/2022. In addition to medications for her anxiety and depression, she was also on Topamax and lamotrigine for possible seizure disorder in the setting of bipolar  disorder. Reviewing her outpatient neurology chart from December 2023, she was on many medications that could have extreme sedating side effects including: Lamotrigine, topiramate, pregabalin, Cymbalta, prazosin, oxycodone, Lexapro, BuSpar, Haldol, trazodone and Flexeril.   ROS: Full ROS was performed and is negative except as noted in the HPI.   Past Medical History:  Diagnosis Date   Anxiety    Arthritis    Phreesia 02/08/2020   Asthma    mild intermittent   Asthma    Phreesia 02/08/2020   Bipolar 1 disorder ([place])    ect treatments last treatment Sep 02 1011   Depression    Depression    Phreesia 02/08/2020   Depression    Phreesia 07/10/2020   GERD (gastroesophageal reflux disease)    Headache    Hypertension    Pre-diabetes    Seizures ([place])    Thinks it was from taking Tramadol   Sleep apnea    wears CPAP, uncertain of setting   Substance abuse ([place])    Phreesia 02/08/2020     Family History  Problem Relation Age of Onset   Diabetes Mother    COPD Mother    Hypertension Mother    Hyperlipidemia Mother    Sleep apnea Mother    Heart disease Father    Hyperlipidemia Father    Hypertension Father    Cancer Father    Sleep apnea Father    Heart disease Brother    Sleep apnea Brother    Heart disease Maternal Grandmother    Hypertension Maternal Grandmother    Sleep apnea Maternal Grandmother    Heart disease Maternal Grandfather  Kidney cancer Paternal Grandmother    Heart disease Paternal Grandfather    Heart disease Daughter      Social History:   reports that she has never smoked. She has never used smokeless tobacco. She reports that she does not drink alcohol and does not use drugs.  Medications  Current Facility-Administered Medications:    0.9 %  sodium chloride infusion, 250 mL, Intravenous, Continuous, McDonald, Stephan Minister, DPM, Stopped at 05/24/22 1920   acetaminophen (TYLENOL) tablet 650 mg, 650 mg, Oral, Q6H PRN, 650 mg at 05/23/22 1655  **OR** acetaminophen (TYLENOL) suppository 650 mg, 650 mg, Rectal, Q6H PRN, McDonald, Adam R, DPM   albuterol (PROVENTIL) (2.5 MG/3ML) 0.083% nebulizer solution 3 mL, 3 mL, Nebulization, Q4H PRN, McDonald, Adam R, DPM   busPIRone (BUSPAR) tablet 15 mg, 15 mg, Oral, TID, McDonald, Adam R, DPM, 15 mg at 06/07/22 2300   Chlorhexidine Gluconate Cloth 2 % PADS 6 each, 6 each, Topical, Q0600, [name], DPM, 6 each at 06/07/22 [phone]   Chlorhexidine Gluconate Cloth 2 % PADS 6 each, 6 each, Topical, Q0600, Oran Rein, MD, 6 each at 06/07/22 0647   dextrose 5 %-0.9 % sodium chloride infusion, , Intravenous, Continuous, Agbata, Tochukwu, MD, Last Rate: 40 mL/hr at 06/07/22 1121, New Bag at 06/07/22 1121   escitalopram (LEXAPRO) tablet 10 mg, 10 mg, Oral, Daily, McDonald, Adam R, DPM, 10 mg at 06/07/22 1023   estradiol (ESTRACE) tablet 1 mg, 1 mg, Oral, QHS, McDonald, Adam R, DPM, 1 mg at 06/07/22 2301   lamoTRIgine (LAMICTAL) tablet 100 mg, 100 mg, Oral, Daily, McDonald, Adam R, DPM, 100 mg at 06/07/22 1022   metoCLOPramide (REGLAN) tablet 10 mg, 10 mg, Oral, Q6H, Agbata, Tochukwu, MD, 10 mg at 06/08/22 1019   ondansetron (ZOFRAN) injection 4 mg, 4 mg, Intravenous, Q6H PRN, [name], DPM, 4 mg at 06/07/22 2306   Oral care mouth rinse, 15 mL, Mouth Rinse, PRN, McDonald, Adam R, DPM   sodium chloride flush (NS) 0.9 % injection 10-40 mL, 10-40 mL, Intracatheter, Q12H, Oran Rein, MD, 10 mL at 06/08/22 1020   sodium chloride flush (NS) 0.9 % injection 10-40 mL, 10-40 mL, Intracatheter, PRN, Oran Rein, MD   sodium chloride flush (NS) 0.9 % injection 3 mL, 3 mL, Intravenous, Q12H, McDonald, Adam R, DPM, 3 mL at 06/07/22 2300   topiramate (TOPAMAX) tablet 50 mg, 50 mg, Oral, BID, [name], DPM, 50 mg at 06/07/22 2301   Exam: Current vital signs: BP (!) 144/82 (BP Location: Right Arm)   Pulse (!) 59   Temp 98 F (36.7 C)   Resp 16   Ht 5' 4"$  (1.626 m)   Wt 104.3 kg   SpO2  94%   BMI 39.48 kg/m  Vital signs in last 24 hours: Temp:  [98 F (36.7 C)-98.3 F (36.8 C)] 98 F (36.7 C) (02/20 0742) Pulse Rate:  [59-69] 59 (02/20 0742) Resp:  [16-17] 16 (02/20 0742) BP: (142-154)/(81-84) 144/82 (02/20 0742) SpO2:  [94 %-98 %] 94 % (02/20 0742) General: Drowsy, nods yes and no in response to some questions but keeps falling asleep during the encounter HEENT: Normocephalic atraumatic Lungs: Clear Cardiovascular: Regular rhythm Abdomen nondistended nontender Extremities: Right foot in bandage Neurological exam Drowsy, nods yes and no to questions.  Extremely poor attention concentration Was able to tell her name and the fact that she is in the hospital to the nurse but could not remember the month or tell her the month.  When I asked her the same questions again, she was only able to tell me her name and then kept falling asleep. She was able to follow simple commands with delayed responses time showing some evidence of generalized psychomotor retardation. Her speech is dysarthric and she has minimal verbal output. Cranial nerves II to XII appear intact Motor examination with symmetric weakness but all 4 limbs are at least antigravity Sensation intact to touch Coordination difficult to assess given her mentation and poor attention concentration Gait testing deferred  Labs I have reviewed labs in epic and the results pertinent to this consultation are:  CBC    Component Value Date/Time   WBC 5.8 06/07/2022 0650   RBC 2.37 (L) 06/07/2022 0650   HGB 7.4 (L) 06/07/2022 0650   HCT 23.2 (L) 06/07/2022 0650   PLT 222 06/07/2022 0650   MCV 97.9 06/07/2022 0650   MCH 31.2 06/07/2022 0650   MCHC 31.9 06/07/2022 0650   RDW 14.1 06/07/2022 0650   LYMPHSABS 0.9 05/21/2022 2339   MONOABS 0.7 05/21/2022 2339   EOSABS 0.0 05/21/2022 2339   BASOSABS 0.0 05/21/2022 2339    CMP     Component Value Date/Time   NA 137 06/08/2022 0605   K 4.0 06/08/2022 0605   CL  110 06/08/2022 0605   CO2 22 06/08/2022 0605   GLUCOSE 107 (H) 06/08/2022 0605   BUN 37 (H) 06/08/2022 0605   CREATININE 1.11 (H) 06/08/2022 0605   CREATININE 0.81 02/19/2022 0841   CALCIUM 7.8 (L) 06/08/2022 0605   PROT 5.4 (L) 06/07/2022 0650   ALBUMIN 2.3 (L) 06/07/2022 0650   AST 11 (L) 06/07/2022 0650   ALT 10 06/07/2022 0650   ALKPHOS 62 06/07/2022 0650   BILITOT 0.6 06/07/2022 0650   GFRNONAA 58 (L) 06/08/2022 0605   GFRNONAA 102 06/27/2020 1457   GFRAA 119 06/27/2020 1457   Ammonia 26 TSH normal Urinalysis from 05/31/2022 negative for UTI B12-ordered  Imaging I have reviewed the images obtained:  CT-head-06/07/2022-no acute process.  Assessment:  57 year old with extensive past medical history that includes anxiety and depression, chronic pain, substance use, history of possible seizure disorder on multiple medications with sedating side effects, obstructive sleep apnea with recent right foot osteomyelitis status post TMA and infection at the TMA site admitted for altered mental status with mentation still remaining altered in spite of toxic and metabolic derangements being treated appropriately. Etiology of her encephalopathy is difficult to discern given a lot of complicating factors such as medications, recent infection, strong psychiatric history, but with her labs showing improvement, I would suspect that the brain would lag somewhat and exhibiting improvement. Hypoxia or hypercarbia less likely given good ABG results. Exam does not look very consistent with serotonin syndrome or NMS either.  Hopefully she will be more cooperative with exam tomorrow and I will have little bit better exam tomorrow as exam today was much limited by her mental status and ability to participate.  Impression: Multifactorial toxic metabolic encephalopathy  Recommendations: MRI brain without contrast Routine EEG Has a history of tardive dyskinesias.  Would hold metoclopramide.  Zofran not  very effective for nausea.  Although not much better, can use Compazine or Thorazine to see if that works.  May have different effect on sedation as well. Try minimizing as much of psych medications as possible-I would hold off on making the changes without psychiatry input. I will follow with you tomorrow. Plan discussed with Dr. Francine Graven via secure chat   -- Weldon Picking  Rory Percy, MD Neurologist [facility] Pager: [phone]

## 2022-06-08 NOTE — TOC Progression Note (Signed)
Transition of Care [facility]) - Progression Note    Patient Details  Name: [name] [name] MRN: [id number] Date of Birth: 1965/07/30  Transition of Care [facility]) CM/SW [place], RN Phone Number: 06/08/2022, 2:17 PM  Clinical Narrative:    Met with the patient and her mother and son in the room, I explained that if she does change her mind and want to go to STR then She will have to agree to stay 30 days and sign over her check for a month to cover what Medicaid does not cover, They all stated that she is refusing to go to [facility] SNF, I explained if she would go home she will need to go to outpatient Pt as I am not able to find a [place] agency to accept her due to Medicaid and Medicaid does not cover many visits, her son stated understanding and said that he knew that, He mother and son will be her caregiver at home, they report that she does have a RW and a 3 in1 at home   Expected Discharge Plan: Home/Self Care Barriers to Discharge: Continued Medical Work up  Expected Discharge Plan and Services   Discharge Planning Services: CM Consult Post Acute Care Choice: [place] arrangements for the past 2 months: Single Family Home                 DME Arranged: N/A DME Agency: NA                   Social Determinants of Health (SDOH) Interventions SDOH Screenings   Food Insecurity: No Food Insecurity (05/22/2022)  Housing: Low Risk  (05/22/2022)  Transportation Needs: No Transportation Needs (05/22/2022)  Utilities: Not At Risk (05/22/2022)  Alcohol Screen: Low Risk  (08/21/2020)  Depression (PHQ2-9): Low Risk  (09/18/2021)  Financial Resource Strain: Low Risk  (08/21/2020)  Physical Activity: Inactive (08/21/2020)  Social Connections: Socially Isolated (08/21/2020)  Stress: No Stress Concern Present (08/21/2020)  Tobacco Use: Low Risk  (05/28/2022)    Readmission Risk Interventions     No data to display

## 2022-06-08 NOTE — Progress Notes (Signed)
Eeg done 

## 2022-06-09 DIAGNOSIS — N179 Acute kidney failure, unspecified: Secondary | ICD-10-CM | POA: Diagnosis not present

## 2022-06-09 DIAGNOSIS — G9341 Metabolic encephalopathy: Secondary | ICD-10-CM | POA: Diagnosis not present

## 2022-06-09 DIAGNOSIS — L089 Local infection of the skin and subcutaneous tissue, unspecified: Secondary | ICD-10-CM | POA: Diagnosis not present

## 2022-06-09 LAB — URINALYSIS, COMPLETE (UACMP) WITH MICROSCOPIC
Bilirubin Urine: NEGATIVE
Glucose, UA: NEGATIVE mg/dL
Ketones, ur: NEGATIVE mg/dL
Nitrite: POSITIVE — AB
Protein, ur: >=300 mg/dL — AB
RBC / HPF: >50 RBC/hpf (ref 0–5)
Specific Gravity, Urine: 1.017 (ref 1.005–1.030)
WBC, UA: >50 WBC/hpf (ref 0–5)
pH: 6 (ref 5.0–8.0)

## 2022-06-09 LAB — GLUCOSE, CAPILLARY
Glucose-Capillary: 102 mg/dL — ABNORMAL HIGH (ref 70–99)
Glucose-Capillary: 105 mg/dL — ABNORMAL HIGH (ref 70–99)
Glucose-Capillary: 123 mg/dL — ABNORMAL HIGH (ref 70–99)
Glucose-Capillary: 92 mg/dL (ref 70–99)
Glucose-Capillary: 95 mg/dL (ref 70–99)
Glucose-Capillary: 98 mg/dL (ref 70–99)
Glucose-Capillary: 99 mg/dL (ref 70–99)

## 2022-06-09 MED ORDER — SODIUM CHLORIDE 0.9 % IV SOLN
1.0000 g | INTRAVENOUS | Status: AC
  Administered 2022-06-09 – 2022-06-11 (×3): 1 g via INTRAVENOUS
  Filled 2022-06-09 (×3): qty 1

## 2022-06-09 MED ORDER — SODIUM CHLORIDE 0.9 % IV SOLN
12.5000 mg | Freq: Four times a day (QID) | INTRAVENOUS | Status: DC | PRN
  Administered 2022-06-09 – 2022-06-10 (×3): 12.5 mg via INTRAVENOUS
  Filled 2022-06-09: qty 12.5
  Filled 2022-06-09: qty 0.5
  Filled 2022-06-09: qty 12.5

## 2022-06-09 NOTE — Progress Notes (Signed)
PT Cancellation Note  Patient Details Name: [name] [name] MRN: [id number] DOB: 1965/05/05   Cancelled Treatment:     Therapist in to see pt who refused any activity stating "I don't feel good". Unable to encourage pt to participate. Will re-attempt next available date/time. Pt has not been participating much with PT.    Josie Dixon 06/09/2022, 12:12 PM

## 2022-06-09 NOTE — Plan of Care (Signed)
  Problem: Activity: Goal: Risk for activity intolerance will decrease Outcome: Progressing   Problem: Nutrition: Goal: Adequate nutrition will be maintained Outcome: Progressing   Problem: Coping: Goal: Level of anxiety will decrease Outcome: Progressing   Problem: Elimination: Goal: Will not experience complications related to bowel motility Outcome: Progressing Goal: Will not experience complications related to urinary retention Outcome: Progressing   Problem: Safety: Goal: Ability to remain free from injury will improve Outcome: Progressing   Problem: Skin Integrity: Goal: Risk for impaired skin integrity will decrease Outcome: Progressing

## 2022-06-09 NOTE — Progress Notes (Signed)
Date of Admission:  05/21/2022     ID: [name] [name] is a 57 y.o. female  Principal Problem:   Acute metabolic encephalopathy Active Problems:   GERD (gastroesophageal reflux disease)   Depression   Essential hypertension   AKI (acute kidney injury) ([place])   Cellulitis of right foot   Severe sepsis ([name])   AMS (altered mental status)   Septic shock ([name])   Abscess of right foot   Morbid obesity ([place])   Right foot infection  Pt with multiple fusion surgeries to the spine, rt reverse shoulder arthroplasty, rt TMA was admitted for altered mental status and infection at the TMA site- CT scan did no show any osteo underwent I/D on 05/22/22 MRSA in culture ( previously it had been MSSA)    Subjective: Pt is doing much better Awake and alert Continues to have nausea     Medications:   busPIRone  15 mg Oral TID   Chlorhexidine Gluconate Cloth  6 each Topical Q0600   Chlorhexidine Gluconate Cloth  6 each Topical Q0600   escitalopram  10 mg Oral Daily   estradiol  1 mg Oral QHS   lamoTRIgine  100 mg Oral Daily   sodium chloride flush  10-40 mL Intracatheter Q12H   sodium chloride flush  3 mL Intravenous Q12H   topiramate  50 mg Oral BID    Objective: Vital signs in last 24 hours: Patient Vitals for the past 24 hrs:  BP Temp Temp src Pulse Resp SpO2  06/09/22 1636 (!) 155/78 97.7 F (36.5 C) Oral (!) 55 16 97 %  06/09/22 0752 (!) 151/77 98.2 F (36.8 C) -- 62 18 98 %  06/09/22 0050 (!) 150/81 98.1 F (36.7 C) -- 60 18 98 %  06/08/22 1938 (!) 144/76 98.6 F (37 C) -- 63 15 97 %     Left PICC foley  PHYSICAL EXAM:  Awake and alert-  Chest B/l air entry [id number] Abd soft Rt foot TMA site is well coapted- no erythema or discharge   Swelling and erythema much improved Some dried blood on the dressing from her walking on that foot     Lab Results Recent Labs    06/07/22 0650 06/07/22 1648 06/08/22 0605 06/08/22 1638  WBC 5.8  --   --   --   HGB 7.4*   --   --  7.9*  HCT 23.2*  --   --  24.1*  NA 139 137 137  --   K 4.4 4.3 4.0  --   CL 111 110 110  --   CO2 24 22 22  $ --   BUN 44* 39* 37*  --   CREATININE 1.08* 1.07* 1.11*  --     Microbiology: MRSA from 05/22/22 culture 05/27/22 surgical culture NG Studies/Results: Xray rt foot- no osteo  Pathology from 05/27/22 of the bone no acute osteo   Assessment/Plan: Rt TMA site infection- underwent I/D and has MRSA infection- was on vanco  Went back for revision TMA on 05/27/22 culture NG and pathology no acute osteo Completed 12 days of appropriate antibiotic  ( dapto) for soft tissue infection Surgical site looking good She should not walk on it- nursing to discuss with podiatrist regarding special boot  Encephalopathy- resolved   Could be from Opioids compounded by AKI hAs been Dc On triple SSRI OSA-    AKI- resolved combination of incomplete emptying ( bladder scan 600cc) vanco/contrast-  started IV fluids Pt has foley -  try to remove and give her a voiding trial No clinical evidence of UTI- Pyruia can be present because of foley  Nausea could be from medication/gastroparesis  H/o multiple surgeries to spine  Depression/anxiety on meds  Discussed the management with  the hospitalist

## 2022-06-09 NOTE — Progress Notes (Signed)
Neurology Progress Note   S:// Seen and examined this morning.  Continues to complain of nausea.  Says Phenergan works best and would like Phenergan to be the antinausea medication.  Also complains of abdominal pain all over and is concerned that she might be having a UTI. She was not wearing her CPAP.  When I asked her if she slept with her CPAP, she said no because she fell asleep and did not remember to wear the CPAP.  O:// Current vital signs: BP (!) 151/77 (BP Location: Right Arm)   Pulse 62   Temp 98.2 F (36.8 C)   Resp 18   Ht 5' 4"$  (1.626 m)   Wt 104.3 kg   SpO2 98%   BMI 39.48 kg/m  Vital signs in last 24 hours: Temp:  [98.1 F (36.7 C)-98.6 F (37 C)] 98.2 F (36.8 C) (02/21 0752) Pulse Rate:  [60-63] 62 (02/21 0752) Resp:  [15-18] 18 (02/21 0752) BP: (144-151)/(76-81) 151/77 (02/21 0752) SpO2:  [97 %-98 %] 98 % (02/21 0752)  General: Sleeping in bed, awakens to voice, more participatory with the exam today. HEENT: Normocephalic atraumatic Lungs: Clear Cardiovascular: Regular rhythm Extremities: Unchanged from yesterday Neurological exam Upon awakening, she is able to participate in the exam Complains of some nausea and abdominal pain. Wants Phenergan instead of the antinausea medications that are being currently used as they are ineffective. Is able to have clear conversations.  No evidence of dysarthria.  No evidence of aphasia.  Is oriented to time place and person. Was able to tell me the correct month and the fact she is in the hospital. Able to follow all commands Cranial nerves II to XII intact Motor examination with no drift in any of the 4 extremities Sensation intact   Medications  Current Facility-Administered Medications:    0.9 %  sodium chloride infusion, 250 mL, Intravenous, Continuous, McDonald, Stephan Minister, DPM, Stopped at 05/24/22 1920   acetaminophen (TYLENOL) tablet 650 mg, 650 mg, Oral, Q6H PRN, 650 mg at 06/09/22 0102 **OR** acetaminophen  (TYLENOL) suppository 650 mg, 650 mg, Rectal, Q6H PRN, McDonald, Adam R, DPM   albuterol (PROVENTIL) (2.5 MG/3ML) 0.083% nebulizer solution 3 mL, 3 mL, Nebulization, Q4H PRN, McDonald, Adam R, DPM   busPIRone (BUSPAR) tablet 15 mg, 15 mg, Oral, TID, McDonald, Adam R, DPM, 15 mg at 06/08/22 2208   Chlorhexidine Gluconate Cloth 2 % PADS 6 each, 6 each, Topical, Q0600, [name], DPM, 6 each at 06/07/22 [phone]   Chlorhexidine Gluconate Cloth 2 % PADS 6 each, 6 each, Topical, Q0600, Oran Rein, MD, 6 each at 06/09/22 0559   dextrose 5 %-0.9 % sodium chloride infusion, , Intravenous, Continuous, Agbata, Tochukwu, MD, Last Rate: 40 mL/hr at 06/08/22 1305, New Bag at 06/08/22 1305   escitalopram (LEXAPRO) tablet 10 mg, 10 mg, Oral, Daily, McDonald, Adam R, DPM, 10 mg at 06/08/22 1635   estradiol (ESTRACE) tablet 1 mg, 1 mg, Oral, QHS, McDonald, Adam R, DPM, 1 mg at 06/08/22 2209   haloperidol (HALDOL) tablet 5 mg, 5 mg, Oral, Daily PRN, Waldon Merl F, NP   lamoTRIgine (LAMICTAL) tablet 100 mg, 100 mg, Oral, Daily, McDonald, Adam R, DPM, 100 mg at 06/08/22 1636   ondansetron (ZOFRAN) injection 4 mg, 4 mg, Intravenous, Q6H PRN, [name], DPM, 4 mg at 06/07/22 2306   Oral care mouth rinse, 15 mL, Mouth Rinse, PRN, McDonald, Adam R, DPM   prochlorperazine (COMPAZINE) injection 10 mg, 10 mg, Intravenous, Q6H PRN,  Collier Bullock, MD, 10 mg at 06/08/22 1634   sodium chloride flush (NS) 0.9 % injection 10-40 mL, 10-40 mL, Intracatheter, Q12H, Oran Rein, MD, 10 mL at 06/08/22 1020   sodium chloride flush (NS) 0.9 % injection 10-40 mL, 10-40 mL, Intracatheter, PRN, Oran Rein, MD   sodium chloride flush (NS) 0.9 % injection 3 mL, 3 mL, Intravenous, Q12H, McDonald, Adam R, DPM, 3 mL at 06/07/22 2300   topiramate (TOPAMAX) tablet 50 mg, 50 mg, Oral, BID, [name], DPM, 50 mg at 06/08/22 2209 Labs CBC    Component Value Date/Time   WBC 5.8 06/07/2022 0650   RBC 2.37 (L)  06/07/2022 0650   HGB 7.9 (L) 06/08/2022 1638   HCT 24.1 (L) 06/08/2022 1638   PLT 222 06/07/2022 0650   MCV 97.9 06/07/2022 0650   MCH 31.2 06/07/2022 0650   MCHC 31.9 06/07/2022 0650   RDW 14.1 06/07/2022 0650   LYMPHSABS 0.9 05/21/2022 2339   MONOABS 0.7 05/21/2022 2339   EOSABS 0.0 05/21/2022 2339   BASOSABS 0.0 05/21/2022 2339    CMP     Component Value Date/Time   NA 137 06/08/2022 0605   K 4.0 06/08/2022 0605   CL 110 06/08/2022 0605   CO2 22 06/08/2022 0605   GLUCOSE 107 (H) 06/08/2022 0605   BUN 37 (H) 06/08/2022 0605   CREATININE 1.11 (H) 06/08/2022 0605   CREATININE 0.81 02/19/2022 0841   CALCIUM 7.8 (L) 06/08/2022 0605   PROT 5.4 (L) 06/07/2022 0650   ALBUMIN 2.3 (L) 06/07/2022 0650   AST 11 (L) 06/07/2022 0650   ALT 10 06/07/2022 0650   ALKPHOS 62 06/07/2022 0650   BILITOT 0.6 06/07/2022 0650   GFRNONAA 58 (L) 06/08/2022 0605   GFRNONAA 102 06/27/2020 1457   GFRAA 119 06/27/2020 1457    glycosylated hemoglobin  Lipid Panel     Component Value Date/Time   CHOL 187 09/18/2021 1051   TRIG 271 (H) 09/18/2021 1051   HDL 67 09/18/2021 1051   CHOLHDL 2.8 09/18/2021 1051   VLDL 45 (H) 11/18/2016 1522   LDLCALC 84 09/18/2021 1051     Imaging and neurodiagnostics I have reviewed images in epic and the results pertinent to this consultation are: EEG: Moderate diffuse encephalopathy.  No seizures or epileptiform discharges MRI brain: No acute abnormality.  Assessment:  57 year old with extensive past medical history including anxiety, depression, substance use, chronic pain, history of possible seizure disorder who is on multiple medications with sedating side effects, obstructive sleep apnea with recent right foot osteomyelitis status post TMA and infection at the TMA site admitted for altered mental status with mentation still remaining altered or waxing and waning in spite of toxic and metabolic derangements being treated appropriately.  Etiology of her  encephalopathy is likely multifactorial toxic metabolic encephalopathy. She has not been using her CPAP at night.  Which might be contributing to lethargy in the morning. She was also on a number of sedating medications which are being weaned or discontinued, which I think will help Her EEG does not show any evidence of epileptiform activity. MRI does not show any evidence of stroke or structural abnormality I think she will progress with time and her mentation will improve with improvement of her toxic metabolic derangements and the improvement in her mentation may lag the lab improvement.  Impression:  Multifactorial toxic metabolic encephalopathy Polypharmacy Examination and studies not suggestive of stroke or seizures.  Recommendations: Discontinue Thorazine and add Phenergan for nausea Continue correction  of toxic metabolic derangements as you are Continue to optimize medications to minimize medications with sedating side effects. Check urinalysis-treat if there is any evidence of UTI although with her being on antibiotic for 70 days, it is unlikely that she has a UTI but she does have a Foley catheter in. Encouraged to wear her CPAP at night Inpatient neurology will be available as needed.  Plan was discussed with Dr. Priscella Mann on the floors.  -- Amie Portland, MD Neurologist [facility] Pager: [phone]

## 2022-06-09 NOTE — Progress Notes (Signed)
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  02/02: to ED as above. Septic shock, required brief levo gtt. 02/03: admitted to hospitalist service, underwent I&D w/ podiatry 02/04: stable on IV abx awaiting Cx 02/05: back to OR for revision I&D.   02/06-02/07: stable, planning back to OR 02/08. Cultures from 02/03 (+)MRSA 02/08: to OR for revision of RLE TMA. Per ID, continue vanco and send bone for pathology and culture  02/09: podiatry and ID recs for continue inpatient for now to ensure surgical stabilization and await culture results to guide abx, will likely be here thru the weekend (today is Friday)   02/10-02/11: stable, awaiting cultures 02/12: Surgical pathology resulted: no osteomyelitis. Cr jumped up today, pt reports no urine yesterday. IV fluids. UA no infection, FeNa prerenal. ID and pharmacy following and adjusting abx and other possible nephrotoxic medications. 600 mL UOP today, Cr worsening this afternoon, will give additional 1L fluids, nephrology consulted.  02/13: minimal improvement Cr, worsening hyponatremia, nephrology recs pending. 02/14: Continues to complain about inability to void.  Bladder scan done at noon had 541 cc of urine.  Serum creatinine peaked at 3.56 and today it is 2.75.  Will place Foley catheter 2/15  : Patient seen and examined at bedside lethargic in bed.  Continues to stay on daptomycin pending ID recommendations for further continuation of medication.  2/16 : Patient seen and examined this morning complaining of not feeling well.  No acute overnight events.  Vital stable.   Labs with persistent hyponatremia with sodium of 125.  Renal function with improvement creatinine 1.7.  Patient continued on daptomycin with last dose on 2/18 as per infectious disease Patient was seen and examined this morning.  No overnight events.  Was lethargic at the time of my evaluation.  Hyponatremia improving. Patient pending PICC line placement for frequent blood draws.holding Lyrica as pt has been lethargic and somnolent.  Patient seen and examined this morning.  Still lethargic no overnight events reported.  Patient had PICC line placed for frequent blood draws.  Patient will have last dose of daptomycin today on 2/18 as per ID.  PT needs to evaluate the patient for safe discharge.  Patient is from home initial plan was to return to home however because of deconditioning would need higher level of care at the time of discharge. 2/19: Patient is seen and examined during rounds and remains very lethargic.  Arouses to name call.  No oral intake.  Blood sugar at bedside 106.  Lyrica has been on hold and will also place Cymbalta on hold as well. 2/20: Patient is seen and examined during rounds.  She is more awake this morning and complains of abdominal pain mostly in the periumbilical area and nausea.  Last bowel movement was 02/19 2/21: Mental status improved this morning.  Suspected due to polypharmacy.  Patient did not wear her CPAP last night.  Complains of diffuse abdominal pain.  Urinalysis consistent with infection.  Start antibiotics.   Assessment & Plan:   Principal Problem:   Acute metabolic encephalopathy Active Problems:   Cellulitis of right foot   Severe sepsis ([place])  GERD (gastroesophageal reflux disease)   Depression   Essential hypertension   AKI (acute kidney injury) ([place])   AMS (altered mental status)   Septic shock ([name])   Abscess of right foot   Morbid obesity ([place])   Right foot infection  Acute metabolic encephalopathy Unclear etiology.  Mental status waxing  waning.  Suspect element of polypharmacy.  Interestingly repeat urinalysis on 2/21 demonstrate signs of infection.  Patient did also not wear her CPAP last night.  Per RN she refused.  Mental status appears intact a.m. 2/21.   EEG unrevealing CT head unrevealing ABG unrevealing Plan:  Percocet and Lyrica on hold due to sedation Treat UTI with Rocephin 1 g daily Ensure patient wears CPAP at night Appreciate psychiatry recommendations and medication management Monitor mental status overnight Possible DC in 24 hours  Septic shock - POA - resolved  Right foot cellulitis and abscess at site of prior TMA with history of prior ulceration (+)MRSA S/p return to the OR on 05/27/2022 for repeat washout, revisional amputation and resection of necrotic bone and soft tissue and primary closure of the amputation site. Surgical path no osteomyelitis   Wound culture yielded MRSA.  Patient has been on IV vancomycin and last dose was on 05/29/22.  Vancomycin was discontinued due to AKI Started on daptomycin on 06/02/22 last dose was on 2/18.    AKI with hyperkalemia and hyponatremia Sharp increase in Cr.  AKI is thought to be multifactorial and related to IV contrast and possible hypotension during surgical procedure as well as poor oral intake. Serum creatinine peaked-3.5 and shows a downward trend 3.5 >> 1.08 >> 1.11.  Patient's baseline is about 0.56 Continue IV fluid hydration.  Improvement in serum sodium levels with hydration Avoid nephrotoxic agents Recheck labs 2/22.  DC fluids if kidney failure resolved   Depression anxiety bipolar disorder history of seizure disorder cont Buspar and Lexapro  cont lamotrigine and Topamax   Chronic pain syndrome recent right shoulder arthroplasty Lyrica and Percocet are on hold due to metabolic encephalopathy   Morbid obesity, BMI [id number] Complicates treatment and prognosis   Sleep apnea CPAP nightly   Anemia (acute on chronic) Patient with baseline  hemoglobin of 10 which has dropped to 7.4 No obvious source of bleeding at this time Aspirin and Lovenox have been on hold Will transfuse for hemoglobin less than 7 Serum iron is within normal limits      Nausea/Vomiting. ??  Diabetic gastroparesis Patient not on Reglan due to history of tardive dyskinesia No improvement with Reglan Patient states the only thing that works for her is Phenergan.  Compazine discontinued.  Phenergan initiated.     DVT prophylaxis: SCD Code Status: Full Family Communication: None Disposition Plan: Status is: Inpatient Remains inpatient appropriate because: Acute metabolic encephalopathy.  Possible discharge in 24 to 48 hours if mental status improved.   Level of care: Med-Surg  Consultants:  Psychiatry Neurology  Procedures:  None  Antimicrobials: None   Subjective: Seen and examined.  Lethargic.  Awakens to answer questions.  Endorses abdominal pain.  Objective: Vitals:   06/08/22 0742 06/08/22 1938 06/09/22 0050 06/09/22 0752  BP: (!) 144/82 (!) 144/76 (!) 150/81 (!) 151/77  Pulse: (!) 59 63 60 62  Resp: 16 15 18 18  $ Temp: 98 F (36.7 C) 98.6 F (37 C) 98.1 F (36.7 C) 98.2 F (36.8 C)  TempSrc:      SpO2: 94% 97% 98% 98%  Weight:      Height:  Intake/Output Summary (Last 24 hours) at 06/09/2022 1213 Last data filed at 06/09/2022 0600 Gross per 24 hour  Intake 240 ml  Output 300 ml  Net -60 ml   Filed Weights   05/22/22 1221 05/24/22 1637 05/27/22 1457  Weight: 108.1 kg 108 kg 104.3 kg    Examination:  General exam: NAD.  Appears chronically ill.  Appears fatigued Respiratory system: Clear to auscultation. Respiratory effort normal. Cardiovascular system: S1-S2, RRR, no murmurs, no pedal edema Gastrointestinal system: Soft, NT/ND, normal bowel sounds Central nervous system: Lethargic.  Oriented x 3.  No focal deficits Extremities: Symmetric 5 x 5 power. Skin: No rashes, lesions or ulcers Psychiatry:  Judgement and insight appear impaired. Mood & affect flattened.     Data Reviewed: I have personally reviewed following labs and imaging studies  CBC: Recent Labs  Lab 06/03/22 0438 06/05/22 1154 06/07/22 0650 06/08/22 1638  WBC 6.3 5.6 5.8  --   HGB 8.2* 8.1* 7.4* 7.9*  HCT 24.9* 24.6* 23.2* 24.1*  MCV 94.7 95.0 97.9  --   PLT 302 274 222  --    Basic Metabolic Panel: Recent Labs  Lab 06/05/22 1154 06/06/22 0626 06/07/22 0650 06/07/22 1648 06/08/22 0605  NA 133* 136 139 137 137  K 4.5 4.3 4.4 4.3 4.0  CL 104 107 111 110 110  CO2 22 24 24 22 22  $ GLUCOSE 97 96 94 131* 107*  BUN 64* 55* 44* 39* 37*  CREATININE 1.25* 1.08* 1.08* 1.07* 1.11*  CALCIUM 8.0* 7.9* 7.8* 7.9* 7.8*  MG  --   --  3.0*  --   --    GFR: Estimated Creatinine Clearance: 65 mL/min (A) (by C-G formula based on SCr of 1.11 mg/dL (H)). Liver Function Tests: Recent Labs  Lab 06/05/22 1154 06/07/22 0650  AST 11* 11*  ALT 8 10  ALKPHOS 55 62  BILITOT 0.5 0.6  PROT 5.7* 5.4*  ALBUMIN 2.5* 2.3*   No results for input(s): "LIPASE", "AMYLASE" in the last 168 hours. Recent Labs  Lab 06/07/22 1651  AMMONIA 26   Coagulation Profile: No results for input(s): "INR", "PROTIME" in the last 168 hours. Cardiac Enzymes: Recent Labs  Lab 06/08/22 0605  CKTOTAL 13*   BNP (last 3 results) No results for input(s): "PROBNP" in the last 8760 hours. HbA1C: No results for input(s): "HGBA1C" in the last 72 hours. CBG: Recent Labs  Lab 06/08/22 1939 06/09/22 0050 06/09/22 0459 06/09/22 0747 06/09/22 1125  GLUCAP 111* 105* 99 95 102*   Lipid Profile: No results for input(s): "CHOL", "HDL", "LDLCALC", "TRIG", "CHOLHDL", "LDLDIRECT" in the last 72 hours. Thyroid Function Tests: Recent Labs    06/07/22 1719  TSH 2.259   Anemia Panel: No results for input(s): "VITAMINB12", "FOLATE", "FERRITIN", "TIBC", "IRON", "RETICCTPCT" in the last 72 hours. Sepsis Labs: No results for input(s):  "PROCALCITON", "LATICACIDVEN" in the last 168 hours.  No results found for this or any previous visit (from the past 240 hour(s)).       Radiology Studies: MR BRAIN WO CONTRAST  Result Date: 06/08/2022 CLINICAL DATA:  Delirium EXAM: MRI HEAD WITHOUT CONTRAST TECHNIQUE: Multiplanar, multiecho pulse sequences of the brain and surrounding structures were obtained without intravenous contrast. COMPARISON:  CT head 06/07/2022, MRI Brain 06/09/21 FINDINGS: Brain: No acute infarction, hemorrhage, hydrocephalus, extra-axial collection or mass lesion. Vascular: Normal flow voids. Skull and upper cervical spine: Normal marrow signal. Sinuses/Orbits: Orbits are unremarkable. Paranasal sinuses are clear. No mastoid effusion. Other: None. IMPRESSION: No acute abnormality.  Electronically Signed   By: Marin Roberts M.D.   On: 06/08/2022 14:55   EEG adult  Result Date: 06/08/2022 Lora Havens, MD     06/08/2022  4:02 PM Patient Name: [name] [name] MRN: [id number] Epilepsy Attending: Lora Havens Referring Physician/Provider: Amie Portland, MD Date: 06/08/2022 Duration: 30.18 mins Patient history: 57yo F with ams. EEG to evaluate for seizure Level of alertness: Awake AEDs during EEG study: None Technical aspects: This EEG study was done with scalp electrodes positioned according to the 10-20 International system of electrode placement. Electrical activity was reviewed with band pass filter of 1-70Hz$ , sensitivity of 7 uV/mm, display speed of 44m/sec with a 60Hz$  notched filter applied as appropriate. EEG data were recorded continuously and digitally stored.  Video monitoring was available and reviewed as appropriate. Description: EEG showed continuous generalized 3 to 6 Hz theta-delta slowing. Hyperventilation and photic stimulation were not performed.   Of note, study was technically difficult due to significant myogenic artifact. ABNORMALITY - Continuous slow, generalized IMPRESSION: This technically  difficult study is suggestive of moderate to severe diffuse encephalopathy, nonspecific etiology. No seizures or epileptiform discharges were seen throughout the recording. [place]  CT HEAD WO CONTRAST (5MM)  Result Date: 06/07/2022 CLINICAL DATA:  Neuro deficit, acute, stroke suspected. EXAM: CT HEAD WITHOUT CONTRAST TECHNIQUE: Contiguous axial images were obtained from the base of the skull through the vertex without intravenous contrast. RADIATION DOSE REDUCTION: This exam was performed according to the departmental dose-optimization program which includes automated exposure control, adjustment of the mA and/or kV according to patient size and/or use of iterative reconstruction technique. COMPARISON:  Head CT and MRI 06/09/2021 FINDINGS: Brain: There is no evidence of an acute infarct, intracranial hemorrhage, mass, midline shift, or extra-axial fluid collection. The ventricles and sulci are normal. Vascular: No hyperdense vessel. Skull: No fracture or suspicious osseous lesion. Sinuses/Orbits: Visualized paranasal sinuses and mastoid air cells are clear. Unremarkable orbits. Other: None. IMPRESSION: Negative head CT. Electronically Signed   By: ALogan BoresM.D.   On: 06/07/2022 15:12        Scheduled Meds:  busPIRone  15 mg Oral TID   Chlorhexidine Gluconate Cloth  6 each Topical Q0600   Chlorhexidine Gluconate Cloth  6 each Topical Q0600   escitalopram  10 mg Oral Daily   estradiol  1 mg Oral QHS   lamoTRIgine  100 mg Oral Daily   sodium chloride flush  10-40 mL Intracatheter Q12H   sodium chloride flush  3 mL Intravenous Q12H   topiramate  50 mg Oral BID   Continuous Infusions:  sodium chloride Stopped (05/24/22 1920)   cefTRIAXone (ROCEPHIN)  IV     dextrose 5 % and 0.9% NaCl 40 mL/hr at 06/08/22 1305   promethazine (PHENERGAN) injection (IM or IVPB) 12.5 mg (06/09/22 1117)     LOS: 18 days     SSidney Ace MD [facility]   If 7PM-7AM, please  contact night-coverage  06/09/2022, 12:13 PM

## 2022-06-09 NOTE — Plan of Care (Signed)

## 2022-06-10 ENCOUNTER — Inpatient Hospital Stay: Payer: Medicaid Other | Admitting: Infectious Diseases

## 2022-06-10 DIAGNOSIS — N179 Acute kidney failure, unspecified: Secondary | ICD-10-CM | POA: Diagnosis not present

## 2022-06-10 DIAGNOSIS — L089 Local infection of the skin and subcutaneous tissue, unspecified: Secondary | ICD-10-CM | POA: Diagnosis not present

## 2022-06-10 DIAGNOSIS — G9341 Metabolic encephalopathy: Secondary | ICD-10-CM | POA: Diagnosis not present

## 2022-06-10 LAB — GLUCOSE, CAPILLARY
Glucose-Capillary: 96 mg/dL (ref 70–99)
Glucose-Capillary: 103 mg/dL — ABNORMAL HIGH (ref 70–99)
Glucose-Capillary: 106 mg/dL — ABNORMAL HIGH (ref 70–99)
Glucose-Capillary: 97 mg/dL (ref 70–99)
Glucose-Capillary: 106 mg/dL — ABNORMAL HIGH (ref 70–99)
Glucose-Capillary: 80 mg/dL (ref 70–99)

## 2022-06-10 MED ORDER — DIPHENOXYLATE-ATROPINE 2.5-0.025 MG PO TABS
1.0000 | ORAL_TABLET | Freq: Four times a day (QID) | ORAL | Status: DC | PRN
  Administered 2022-06-10 – 2022-06-12 (×4): 1 via ORAL
  Filled 2022-06-10 (×4): qty 1

## 2022-06-10 MED ORDER — PREGABALIN 50 MG PO CAPS
50.0000 mg | ORAL_CAPSULE | Freq: Two times a day (BID) | ORAL | Status: DC
  Administered 2022-06-10 – 2022-06-14 (×9): 50 mg via ORAL
  Filled 2022-06-10 (×9): qty 1

## 2022-06-10 MED ORDER — SODIUM CHLORIDE 0.9 % IV SOLN
25.0000 mg | Freq: Four times a day (QID) | INTRAVENOUS | Status: DC | PRN
  Administered 2022-06-10 – 2022-06-11 (×3): 25 mg via INTRAVENOUS
  Filled 2022-06-10: qty 1
  Filled 2022-06-10 (×2): qty 25

## 2022-06-10 MED ORDER — LOPERAMIDE HCL 2 MG PO CAPS
2.0000 mg | ORAL_CAPSULE | Freq: Once | ORAL | Status: AC
  Administered 2022-06-10: 2 mg via ORAL
  Filled 2022-06-10: qty 1

## 2022-06-10 NOTE — Plan of Care (Signed)
  Problem: Education: Goal: Knowledge of General Education information will improve Description: Including pain rating scale, medication(s)/side effects and non-pharmacologic comfort measures Outcome: Progressing   Problem: Elimination: Goal: Will not experience complications related to bowel motility Outcome: Progressing   Problem: Coping: Goal: Level of anxiety will decrease Outcome: Progressing   Problem: Nutrition: Goal: Adequate nutrition will be maintained Outcome: Progressing   Problem: Activity: Goal: Risk for activity intolerance will decrease Outcome: Progressing   Problem: Skin Integrity: Goal: Risk for impaired skin integrity will decrease Outcome: Progressing   Problem: Pain Managment: Goal: General experience of comfort will improve Outcome: Progressing

## 2022-06-10 NOTE — Progress Notes (Signed)
PROGRESS NOTE    [name] [name]  [id number] DOB: [birth year] DOA: 05/21/2022 PCP: [name], MD    Brief Narrative:  57 year old female with history of bipolar disorder, total arthroplasty right shoulder December 2023, chronic pain syndrome, anxiety depression, who presented to emergency room 05/21/22 with altered mental status and redness of the right foot amputation stump, was on Keflex at home, TMA 2 mos prior.  02/02: to ED as above. Septic shock, required brief levo gtt. 02/03: admitted to hospitalist service, underwent I&D w/ podiatry 02/04: stable on IV abx awaiting Cx 02/05: back to OR for revision I&D.   02/06-02/07: stable, planning back to OR 02/08. Cultures from 02/03 (+)MRSA 02/08: to OR for revision of RLE TMA. Per ID, continue vanco and send bone for pathology and culture  02/09: podiatry and ID recs for continue inpatient for now to ensure surgical stabilization and await culture results to guide abx, will likely be here thru the weekend (today is Friday)   02/10-02/11: stable, awaiting cultures 02/12: Surgical pathology resulted: no osteomyelitis. Cr jumped up today, pt reports no urine yesterday. IV fluids. UA no infection, FeNa prerenal. ID and pharmacy following and adjusting abx and other possible nephrotoxic medications. 600 mL UOP today, Cr worsening this afternoon, will give additional 1L fluids, nephrology consulted.  02/13: minimal improvement Cr, worsening hyponatremia, nephrology recs pending. 02/14: Continues to complain about inability to void.  Bladder scan done at noon had 541 cc of urine.  Serum creatinine peaked at 3.56 and today it is 2.75.  Will place Foley catheter 2/15  : Patient seen and examined at bedside lethargic in bed.  Continues to stay on daptomycin pending ID recommendations for further continuation of medication.  2/16 : Patient seen and examined this morning complaining of not feeling well.  No acute overnight events.  Vital stable.   Labs with persistent hyponatremia with sodium of 125.  Renal function with improvement creatinine 1.7.  Patient continued on daptomycin with last dose on 2/18 as per infectious disease Patient was seen and examined this morning.  No overnight events.  Was lethargic at the time of my evaluation.  Hyponatremia improving. Patient pending PICC line placement for frequent blood draws.holding Lyrica as pt has been lethargic and somnolent.  Patient seen and examined this morning.  Still lethargic no overnight events reported.  Patient had PICC line placed for frequent blood draws.  Patient will have last dose of daptomycin today on 2/18 as per ID.  PT needs to evaluate the patient for safe discharge.  Patient is from home initial plan was to return to home however because of deconditioning would need higher level of care at the time of discharge. 2/19: Patient is seen and examined during rounds and remains very lethargic.  Arouses to name call.  No oral intake.  Blood sugar at bedside 106.  Lyrica has been on hold and will also place Cymbalta on hold as well. 2/20: Patient is seen and examined during rounds.  She is more awake this morning and complains of abdominal pain mostly in the periumbilical area and nausea.  Last bowel movement was 02/19 2/21: Mental status improved this morning.  Suspected due to polypharmacy.  Patient did not wear her CPAP last night.  Complains of diffuse abdominal pain.  Urinalysis consistent with infection.  Start antibiotics. 2/22: Mental status improved.  Abdominal pain improved.  Foley discontinued yesterday.  Patient able to void.  Did not wear CPAP again overnight.  Continues to endorse  severe nausea.   Assessment & Plan:   Principal Problem:   Acute metabolic encephalopathy Active Problems:   Cellulitis of right foot   Severe sepsis ([name])   GERD (gastroesophageal reflux disease)   Depression   Essential hypertension   AKI (acute kidney injury) ([place])   AMS (altered  mental status)   Septic shock ([name])   Abscess of right foot   Morbid obesity ([place])   Right foot infection  Acute metabolic encephalopathy Unclear etiology.  Mental status waxing waning.  Suspect element of polypharmacy.  Interestingly repeat urinalysis on 2/21 demonstrate signs of infection.  Patient did also not wear her CPAP last night.  Per RN she refused.  Mental status appears intact a.m. 2/21.   EEG unrevealing CT head unrevealing ABG unrevealing Plan:  Continue holding Percocet Lyrica restarted at reduced dose Per ID no indication for UTI treatment, suspect chronic pyuria Ensure patient wears CPAP nightly Possible disposition in 24 hours  Septic shock - POA - resolved  Right foot cellulitis and abscess at site of prior TMA with history of prior ulceration (+)MRSA S/p return to the OR on 05/27/2022 for repeat washout, revisional amputation and resection of necrotic bone and soft tissue and primary closure of the amputation site. Surgical path no osteomyelitis   Wound culture yielded MRSA.  Patient has been on IV vancomycin and last dose was on 05/29/22.  Vancomycin was discontinued due to AKI Started on daptomycin on 06/02/22 last dose was on 2/18.    AKI with hyperkalemia and hyponatremia Sharp increase in Cr.  AKI is thought to be multifactorial and related to IV contrast and possible hypotension during surgical procedure as well as poor oral intake. Serum creatinine peaked-3.5 and shows a downward trend 3.5 >> 1.08 >> 1.11.  Patient's baseline is about 0.56 DC IVF Avoid nephrotoxic agents   Depression anxiety bipolar disorder history of seizure disorder cont Buspar and Lexapro  cont lamotrigine and Topamax   Chronic pain syndrome recent right shoulder arthroplasty Percocet on hold.  Lyrica restarted at reduced dose   Morbid obesity, BMI [id number] Complicates treatment and prognosis   Sleep apnea CPAP nightly   Anemia (acute on chronic) Patient with baseline  hemoglobin of 10 which has dropped to 7.4 No obvious source of bleeding at this time Aspirin and Lovenox have been on hold Will transfuse for hemoglobin less than 7 Serum iron is within normal limits   Nausea/Vomiting. ??  Diabetic gastroparesis Patient not on Reglan due to history of tardive dyskinesia No improvement with Reglan Patient states the only thing that works for her is Phenergan.  Compazine discontinued.  Phenergan initiated.  Dose increased to 25 mg every 6 hours as needed     DVT prophylaxis: SCD Code Status: Full Family Communication: None Disposition Plan: Status is: Inpatient Remains inpatient appropriate because: Resolving metabolic encephalopathy.  Possible disposition in 24 to 48 hours.   Level of care: Med-Surg  Consultants:  Psychiatry Neurology  Procedures:  None  Antimicrobials: None   Subjective: Seen and examined.  More awake this morning.  Answers questions appropriately.  Endorses nausea.  Objective: Vitals:   06/09/22 1636 06/09/22 2302 06/10/22 0038 06/10/22 0741  BP: (!) 155/78 (!) 146/77 (!) 145/77 (!) 141/75  Pulse: (!) 55 64 61 (!) 59  Resp: 16 20 17 20  $ Temp: 97.7 F (36.5 C) 98.4 F (36.9 C) 98.1 F (36.7 C) (!) 97.4 F (36.3 C)  TempSrc: Oral Oral  Oral  SpO2: 97% 97% 100% 99%  Weight:      Height:        Intake/Output Summary (Last 24 hours) at 06/10/2022 1233 Last data filed at 06/10/2022 1108 Gross per 24 hour  Intake 2036.9 ml  Output 200 ml  Net 1836.9 ml   Filed Weights   05/22/22 1221 05/24/22 1637 05/27/22 1457  Weight: 108.1 kg 108 kg 104.3 kg    Examination:  General exam: No acute distress.  Appears fatigued and chronically ill Respiratory system: Clear to auscultation. Respiratory effort normal. Cardiovascular system: S1-S2, RRR, no murmurs, no pedal edema Gastrointestinal system: Obese, soft, NT/ND, normal bowel sounds Central nervous system: Lethargic.  Oriented x 3.  No focal  deficits Extremities: Symmetric 5 x 5 power. Skin: No rashes, lesions or ulcers Psychiatry: Judgement and insight appear impaired. Mood & affect flattened.     Data Reviewed: I have personally reviewed following labs and imaging studies  CBC: Recent Labs  Lab 06/05/22 1154 06/07/22 0650 06/08/22 1638  WBC 5.6 5.8  --   HGB 8.1* 7.4* 7.9*  HCT 24.6* 23.2* 24.1*  MCV 95.0 97.9  --   PLT 274 222  --    Basic Metabolic Panel: Recent Labs  Lab 06/05/22 1154 06/06/22 0626 06/07/22 0650 06/07/22 1648 06/08/22 0605  NA 133* 136 139 137 137  K 4.5 4.3 4.4 4.3 4.0  CL 104 107 111 110 110  CO2 22 24 24 22 22  $ GLUCOSE 97 96 94 131* 107*  BUN 64* 55* 44* 39* 37*  CREATININE 1.25* 1.08* 1.08* 1.07* 1.11*  CALCIUM 8.0* 7.9* 7.8* 7.9* 7.8*  MG  --   --  3.0*  --   --    GFR: Estimated Creatinine Clearance: 65 mL/min (A) (by C-G formula based on SCr of 1.11 mg/dL (H)). Liver Function Tests: Recent Labs  Lab 06/05/22 1154 06/07/22 0650  AST 11* 11*  ALT 8 10  ALKPHOS 55 62  BILITOT 0.5 0.6  PROT 5.7* 5.4*  ALBUMIN 2.5* 2.3*   No results for input(s): "LIPASE", "AMYLASE" in the last 168 hours. Recent Labs  Lab 06/07/22 1651  AMMONIA 26   Coagulation Profile: No results for input(s): "INR", "PROTIME" in the last 168 hours. Cardiac Enzymes: Recent Labs  Lab 06/08/22 0605  CKTOTAL 13*   BNP (last 3 results) No results for input(s): "PROBNP" in the last 8760 hours. HbA1C: No results for input(s): "HGBA1C" in the last 72 hours. CBG: Recent Labs  Lab 06/09/22 1942 06/09/22 2302 06/10/22 0335 06/10/22 0829 06/10/22 1138  GLUCAP 123* 98 96 103* 106*   Lipid Profile: No results for input(s): "CHOL", "HDL", "LDLCALC", "TRIG", "CHOLHDL", "LDLDIRECT" in the last 72 hours. Thyroid Function Tests: Recent Labs    06/07/22 1719  TSH 2.259   Anemia Panel: No results for input(s): "VITAMINB12", "FOLATE", "FERRITIN", "TIBC", "IRON", "RETICCTPCT" in the last 72  hours. Sepsis Labs: No results for input(s): "PROCALCITON", "LATICACIDVEN" in the last 168 hours.  No results found for this or any previous visit (from the past 240 hour(s)).       Radiology Studies: MR BRAIN WO CONTRAST  Result Date: 06/08/2022 CLINICAL DATA:  Delirium EXAM: MRI HEAD WITHOUT CONTRAST TECHNIQUE: Multiplanar, multiecho pulse sequences of the brain and surrounding structures were obtained without intravenous contrast. COMPARISON:  CT head 06/07/2022, MRI Brain 06/09/21 FINDINGS: Brain: No acute infarction, hemorrhage, hydrocephalus, extra-axial collection or mass lesion. Vascular: Normal flow voids. Skull and upper cervical spine: Normal marrow signal. Sinuses/Orbits: Orbits are unremarkable. Paranasal sinuses are clear.  No mastoid effusion. Other: None. IMPRESSION: No acute abnormality. Electronically Signed   By: Marin Roberts M.D.   On: 06/08/2022 14:55   EEG adult  Result Date: 06/08/2022 Lora Havens, MD     06/08/2022  4:02 PM Patient Name: [name] [name] MRN: [id number] Epilepsy Attending: Lora Havens Referring Physician/Provider: Amie Portland, MD Date: 06/08/2022 Duration: 30.18 mins Patient history: 57yo F with ams. EEG to evaluate for seizure Level of alertness: Awake AEDs during EEG study: None Technical aspects: This EEG study was done with scalp electrodes positioned according to the 10-20 International system of electrode placement. Electrical activity was reviewed with band pass filter of 1-70Hz$ , sensitivity of 7 uV/mm, display speed of 61m/sec with a 60Hz$  notched filter applied as appropriate. EEG data were recorded continuously and digitally stored.  Video monitoring was available and reviewed as appropriate. Description: EEG showed continuous generalized 3 to 6 Hz theta-delta slowing. Hyperventilation and photic stimulation were not performed.   Of note, study was technically difficult due to significant myogenic artifact. ABNORMALITY - Continuous slow,  generalized IMPRESSION: This technically difficult study is suggestive of moderate to severe diffuse encephalopathy, nonspecific etiology. No seizures or epileptiform discharges were seen throughout the recording. Priyanka OBarbra Sarks       Scheduled Meds:  busPIRone  15 mg Oral TID   Chlorhexidine Gluconate Cloth  6 each Topical Q0600   Chlorhexidine Gluconate Cloth  6 each Topical Q0600   escitalopram  10 mg Oral Daily   estradiol  1 mg Oral QHS   lamoTRIgine  100 mg Oral Daily   pregabalin  50 mg Oral BID   sodium chloride flush  10-40 mL Intracatheter Q12H   sodium chloride flush  3 mL Intravenous Q12H   topiramate  50 mg Oral BID   Continuous Infusions:  sodium chloride Stopped (05/24/22 1920)   cefTRIAXone (ROCEPHIN)  IV 1 g (06/09/22 1400)   promethazine (PHENERGAN) injection (IM or IVPB) 25 mg (06/10/22 1055)     LOS: 19 days     SSidney Ace MD [facility]   If 7PM-7AM, please contact night-coverage  06/10/2022, 12:33 PM

## 2022-06-10 NOTE — Progress Notes (Signed)
Physical Therapy Treatment Patient Details Name: [name] [name] MRN: [id number] DOB: 10/26/1965 Today's Date: 06/10/2022   History of Present Illness 57 year old female with history of bipolar disorder, total arthroplasty right shoulder April 14, 2022, chronic pain syndrome, anxiety depression, recent right toe amputation who presented to emergency room with altered mental status and redness of the right foot amputation.    PT Comments    Pt resting in bed upon PT arrival; pt reporting nausea and having diarrhea this morning (nursing aware) but agreeable to some therapy.  During session pt CGA to SBA with bed mobility and SBA with laterally scooting to L along bed a couple feet (pt declined further activity d/t nausea but did perform a couple LE ex's in sitting before requesting to lay down d/t not feeling well).  Reviewed [place] precautions R LE and pt able to maintain [place] precautions during session.  Nurse reports pt has been transferring to [facility] for toileting.  Will continue to focus on strengthening and progressive functional mobility during hospitalization.    Recommendations for follow up therapy are one component of a multi-disciplinary discharge planning process, led by the attending physician.  Recommendations may be updated based on patient status, additional functional criteria and insurance authorization.  Follow Up Recommendations  Follow physician's recommendations for discharge plan and follow up therapies Can patient physically be transported by private vehicle: Yes   Assistance Recommended at Discharge Frequent or constant Supervision/Assistance  Patient can return home with the following A little help with walking and/or transfers;A little help with bathing/dressing/bathroom;Assistance with cooking/housework;Direct supervision/assist for medications management;Direct supervision/assist for financial management;Assist for transportation;Help with stairs or ramp for  entrance   Equipment Recommendations  None recommended by PT (Pt reports having all equipment needs met (declined hospital bed).)    Recommendations for Other Services       Precautions / Restrictions Precautions Precautions: Fall Restrictions Weight Bearing Restrictions: Yes RUE Weight Bearing: Weight bearing as tolerated RLE Weight Bearing: Non weight bearing     Mobility  Bed Mobility Overal bed mobility: Needs Assistance Bed Mobility: Supine to Sit     Supine to sit: Min guard Sit to supine: Supervision   General bed mobility comments: increased effort to perform on own    Transfers Overall transfer level: Needs assistance Equipment used: None Transfers: Bed to chair/wheelchair/BSC            Lateral/Scoot Transfers: Supervision General transfer comment: lateral scoot a couple feet to L along bed; vc's for technique    Ambulation/Gait Ambulation/Gait assistance:  (pt declined d/t nausea)                 Stairs             Wheelchair Mobility    Modified Rankin (Stroke Patients Only)       Balance Overall balance assessment: Needs assistance Sitting-balance support: No upper extremity supported Sitting balance-Leahy Scale: Good Sitting balance - Comments: steady sitting reaching within BOS                                    Cognition Arousal/Alertness: Awake/alert Behavior During Therapy: Flat affect Overall Cognitive Status: Within Functional Limits for tasks assessed  Exercises General Exercises - Lower Extremity Long Arc Quad: AROM, Strengthening, Both, 10 reps, Seated Hip Flexion/Marching: AROM, Strengthening, Both, 10 reps, Seated    General Comments  Nursing cleared pt for participation in physical therapy.  Pt agreeable to limited PT session.      Pertinent Vitals/Pain Pain Assessment Pain Assessment: Faces Pain Score: 7  Pain Location:  chronic back pain Pain Intervention(s): Limited activity within patient's tolerance, Monitored during session, Repositioned (pt reports chronic pain and declined pain meds at this time) Vitals (HR and O2 on room air) stable and WFL throughout treatment session.    Home Living                          Prior Function            PT Goals (current goals can now be found in the care plan section) Acute Rehab PT Goals Patient Stated Goal: go home when I feel better PT Goal Formulation: With patient Time For Goal Achievement: 06/24/22 Potential to Achieve Goals: Fair Progress towards PT goals: Progressing toward goals    Frequency    7X/week      PT Plan Current plan remains appropriate    Co-evaluation              AM-PAC PT "6 Clicks" Mobility   Outcome Measure  Help needed turning from your back to your side while in a flat bed without using bedrails?: None Help needed moving from lying on your back to sitting on the side of a flat bed without using bedrails?: None Help needed moving to and from a bed to a chair (including a wheelchair)?: A Little Help needed standing up from a chair using your arms (e.g., wheelchair or bedside chair)?: A Little Help needed to walk in hospital room?: A Lot Help needed climbing 3-5 steps with a railing? : Total 6 Click Score: 17    End of Session   Activity Tolerance: Patient limited by fatigue;Other (comment) (Limited d/t nausea) Patient left: in bed;with call bell/phone within reach;with bed alarm set;Other (comment) (B LE's elevated on pillows with heels floating) Nurse Communication: Mobility status;Precautions PT Visit Diagnosis: Muscle weakness (generalized) (M62.81);Difficulty in walking, not elsewhere classified (R26.2)     Time: [id number] PT Time Calculation (min) (ACUTE ONLY): 17 min  Charges:  $Therapeutic Activity: 8-22 mins                     Leitha Bleak, PT 06/10/22, 11:02 AM

## 2022-06-10 NOTE — Progress Notes (Signed)
       CROSS COVER NOTE  NAME: [name] [name] MRN: [id number] DOB : [birth year] ATTENDING PHYSICIAN: Sidney Ace, MD    Date of Service   06/10/2022   HPI/Events of Note   Medication request received from patient to address (3) type 7 stools since 1900.  Interventions   Assessment/Plan:  Imodium x1      To reach the provider On-Call:   7AM- 7PM see care teams to locate the attending and reach out to them via [email or web address] Password: [email or web address] 7PM-7AM contact night-coverage If you still have difficulty reaching the appropriate provider, please page the [facility] (Director on Call) for [facility] on amion for assistance  This document was prepared using Systems analyst and may include unintentional dictation errors.  Neomia Glass DNP, MBA, FNP-BC, PMHNP-BC Nurse Practitioner [facility] Pager [phone]

## 2022-06-10 NOTE — Progress Notes (Signed)
Date of Admission:  05/21/2022     ID: [name] [name] is a 57 y.o. female  Principal Problem:   Acute metabolic encephalopathy Active Problems:   GERD (gastroesophageal reflux disease)   Depression   Essential hypertension   AKI (acute kidney injury) ([place])   Cellulitis of right foot   Severe sepsis ([name])   AMS (altered mental status)   Septic shock ([name])   Abscess of right foot   Morbid obesity ([place])   Right foot infection  Pt with multiple fusion surgeries to the spine, rt reverse shoulder arthroplasty, rt TMA was admitted for altered mental status and infection at the TMA site- CT scan did no show any osteo underwent I/D on 05/22/22 MRSA in culture ( previously it had been MSSA)    Subjective: Continues with nausea Also had 5 loose stools Some abdominal discomfort     Medications:   busPIRone  15 mg Oral TID   Chlorhexidine Gluconate Cloth  6 each Topical Q0600   Chlorhexidine Gluconate Cloth  6 each Topical Q0600   escitalopram  10 mg Oral Daily   estradiol  1 mg Oral QHS   lamoTRIgine  100 mg Oral Daily   pregabalin  50 mg Oral BID   sodium chloride flush  10-40 mL Intracatheter Q12H   sodium chloride flush  3 mL Intravenous Q12H   topiramate  50 mg Oral BID    Objective: Vital signs in last 24 hours: Patient Vitals for the past 24 hrs:  BP Temp Temp src Pulse Resp SpO2  06/10/22 1535 (!) 116/55 97.9 F (36.6 C) -- (!) 57 16 96 %  06/10/22 0741 (!) 141/75 (!) 97.4 F (36.3 C) Oral (!) 59 20 99 %  06/10/22 0038 (!) 145/77 98.1 F (36.7 C) -- 61 17 100 %  06/09/22 2302 (!) 146/77 98.4 F (36.9 C) Oral 64 20 97 %     Left PICC  PHYSICAL EXAM:  Awake and alert- pale Chest B/l air entry [id number] Abd soft Rt foot TMA site is well coapted- no erythema or discharge      Swelling and erythema improved Some dried blood on the dressing from her walking on that foot     Lab Results Recent Labs    06/08/22 0605 06/08/22 1638  HGB  --  7.9*   HCT  --  24.1*  NA 137  --   K 4.0  --   CL 110  --   CO2 22  --   BUN 37*  --   CREATININE 1.11*  --     Microbiology: MRSA from 05/22/22 culture 05/27/22 surgical culture NG Studies/Results: Xray rt foot- no osteo  Pathology from 05/27/22 of the bone no acute osteo   Assessment/Plan: Rt TMA site infection- underwent I/D and has MRSA infection- was on vanco  Went back for revision TMA on 05/27/22 culture NG and pathology no acute osteo Completed 12 days of appropriate antibiotic  ( dapto) for soft tissue infection Surgical site looking good She should not walk on it- nursing to discuss with podiatrist regarding special boot  Encephalopathy- resolved   Could be from Opioids compounded by AKI hAs been Dc On triple SSRI OSA-    AKI- resolved combination of incomplete emptying ( bladder scan 600cc) vanco/contrast-  started IV fluids Pt has foley - try to remove and give her a voiding trial No clinical evidence of UTI- Dc ceftriaxone Pyruia can be present because of foley which has been  removed  Nausea could be from medication/gastroparesis Diarrhea- check GI panel PCR  H/o multiple surgeries to spine  Depression/anxiety on meds  Discussed the management with  her nurse

## 2022-06-10 NOTE — TOC Progression Note (Signed)
Transition of Care [facility]) - Progression Note    Patient Details  Name: [name] [name] MRN: [id number] Date of Birth: [birth year]  Transition of Care [facility]) CM/SW [place], RN Phone Number: 06/10/2022, 10:39 AM  Clinical Narrative:     TOC continues to monitor the patient for additional needs, Mental status had improved,   Expected Discharge Plan: Home/Self Care Barriers to Discharge: Continued Medical Work up  Expected Discharge Plan and Services   Discharge Planning Services: CM Consult Post Acute Care Choice: [place] arrangements for the past 2 months: Single Family Home                 DME Arranged: N/A DME Agency: NA                   Social Determinants of Health (SDOH) Interventions SDOH Screenings   Food Insecurity: No Food Insecurity (05/22/2022)  Housing: Low Risk  (05/22/2022)  Transportation Needs: No Transportation Needs (05/22/2022)  Utilities: Not At Risk (05/22/2022)  Alcohol Screen: Low Risk  (08/21/2020)  Depression (PHQ2-9): Low Risk  (09/18/2021)  Financial Resource Strain: Low Risk  (08/21/2020)  Physical Activity: Inactive (08/21/2020)  Social Connections: Socially Isolated (08/21/2020)  Stress: No Stress Concern Present (08/21/2020)  Tobacco Use: Low Risk  (05/28/2022)    Readmission Risk Interventions     No data to display

## 2022-06-11 ENCOUNTER — Inpatient Hospital Stay: Payer: 59

## 2022-06-11 DIAGNOSIS — G9341 Metabolic encephalopathy: Secondary | ICD-10-CM | POA: Diagnosis not present

## 2022-06-11 DIAGNOSIS — L089 Local infection of the skin and subcutaneous tissue, unspecified: Secondary | ICD-10-CM | POA: Diagnosis not present

## 2022-06-11 DIAGNOSIS — F32A Depression, unspecified: Secondary | ICD-10-CM | POA: Diagnosis not present

## 2022-06-11 DIAGNOSIS — N179 Acute kidney failure, unspecified: Secondary | ICD-10-CM | POA: Diagnosis not present

## 2022-06-11 LAB — GASTROINTESTINAL PANEL BY PCR, STOOL (REPLACES STOOL CULTURE)

## 2022-06-11 LAB — GLUCOSE, CAPILLARY
Glucose-Capillary: 85 mg/dL (ref 70–99)
Glucose-Capillary: 95 mg/dL (ref 70–99)
Glucose-Capillary: 82 mg/dL (ref 70–99)
Glucose-Capillary: 93 mg/dL (ref 70–99)
Glucose-Capillary: 89 mg/dL (ref 70–99)

## 2022-06-11 MED ORDER — ADULT MULTIVITAMIN W/MINERALS CH
1.0000 | ORAL_TABLET | Freq: Every day | ORAL | Status: DC
  Administered 2022-06-11 – 2022-06-15 (×5): 1 via ORAL
  Filled 2022-06-11 (×5): qty 1

## 2022-06-11 MED ORDER — ENSURE ENLIVE PO LIQD
237.0000 mL | Freq: Two times a day (BID) | ORAL | Status: DC
  Administered 2022-06-11: 237 mL via ORAL

## 2022-06-11 MED ORDER — CHOLESTYRAMINE 4 G PO PACK
4.0000 g | PACK | Freq: Two times a day (BID) | ORAL | Status: DC
  Administered 2022-06-11 – 2022-06-13 (×6): 4 g via ORAL
  Filled 2022-06-11 (×7): qty 1

## 2022-06-11 MED ORDER — ENSURE ENLIVE PO LIQD
237.0000 mL | Freq: Three times a day (TID) | ORAL | Status: DC
  Administered 2022-06-11 – 2022-06-15 (×10): 237 mL via ORAL

## 2022-06-11 MED ORDER — SODIUM CHLORIDE 0.9 % IV SOLN
25.0000 mg | INTRAVENOUS | Status: DC | PRN
  Administered 2022-06-11 – 2022-06-12 (×2): 25 mg via INTRAVENOUS
  Filled 2022-06-11 (×2): qty 1

## 2022-06-11 MED ORDER — DULOXETINE HCL 20 MG PO CPEP
20.0000 mg | ORAL_CAPSULE | Freq: Every day | ORAL | Status: DC
  Administered 2022-06-11 – 2022-06-15 (×5): 20 mg via ORAL
  Filled 2022-06-11 (×5): qty 1

## 2022-06-11 NOTE — Progress Notes (Signed)
PROGRESS NOTE    [name] [name]  [id number] DOB: Sep 11, 1965 DOA: 05/21/2022 PCP: [name], MD    Brief Narrative:  57 year old female with history of bipolar disorder, total arthroplasty right shoulder December 2023, chronic pain syndrome, anxiety depression, who presented to emergency room 05/21/22 with altered mental status and redness of the right foot amputation stump, was on Keflex at home, TMA 2 mos prior.  02/02: to ED as above. Septic shock, required brief levo gtt. 02/03: admitted to hospitalist service, underwent I&D w/ podiatry 02/04: stable on IV abx awaiting Cx 02/05: back to OR for revision I&D.   02/06-02/07: stable, planning back to OR 02/08. Cultures from 02/03 (+)MRSA 02/08: to OR for revision of RLE TMA. Per ID, continue vanco and send bone for pathology and culture  02/09: podiatry and ID recs for continue inpatient for now to ensure surgical stabilization and await culture results to guide abx, will likely be here thru the weekend (today is Friday)   02/10-02/11: stable, awaiting cultures 02/12: Surgical pathology resulted: no osteomyelitis. Cr jumped up today, pt reports no urine yesterday. IV fluids. UA no infection, FeNa prerenal. ID and pharmacy following and adjusting abx and other possible nephrotoxic medications. 600 mL UOP today, Cr worsening this afternoon, will give additional 1L fluids, nephrology consulted.  02/13: minimal improvement Cr, worsening hyponatremia, nephrology recs pending. 02/14: Continues to complain about inability to void.  Bladder scan done at noon had 541 cc of urine.  Serum creatinine peaked at 3.56 and today it is 2.75.  Will place Foley catheter 2/15  : Patient seen and examined at bedside lethargic in bed.  Continues to stay on daptomycin pending ID recommendations for further continuation of medication.  2/16 : Patient seen and examined this morning complaining of not feeling well.  No acute overnight events.  Vital stable.   Labs with persistent hyponatremia with sodium of 125.  Renal function with improvement creatinine 1.7.  Patient continued on daptomycin with last dose on 2/18 as per infectious disease Patient was seen and examined this morning.  No overnight events.  Was lethargic at the time of my evaluation.  Hyponatremia improving. Patient pending PICC line placement for frequent blood draws.holding Lyrica as pt has been lethargic and somnolent.  Patient seen and examined this morning.  Still lethargic no overnight events reported.  Patient had PICC line placed for frequent blood draws.  Patient will have last dose of daptomycin today on 2/18 as per ID.  PT needs to evaluate the patient for safe discharge.  Patient is from home initial plan was to return to home however because of deconditioning would need higher level of care at the time of discharge. 2/19: Patient is seen and examined during rounds and remains very lethargic.  Arouses to name call.  No oral intake.  Blood sugar at bedside 106.  Lyrica has been on hold and will also place Cymbalta on hold as well. 2/20: Patient is seen and examined during rounds.  She is more awake this morning and complains of abdominal pain mostly in the periumbilical area and nausea.  Last bowel movement was 02/19 2/21: Mental status improved this morning.  Suspected due to polypharmacy.  Patient did not wear her CPAP last night.  Complains of diffuse abdominal pain.  Urinalysis consistent with infection.  Start antibiotics. 2/22: Mental status improved.  Abdominal pain improved.  Foley discontinued yesterday.  Patient able to void.  Did not wear CPAP again overnight.  Continues to endorse  severe nausea. 2/23: Patient remains with nausea and diarrhea.  Very deconditioned.  Abdominal CT demonstrates diffuse body wall anasarca.   Assessment & Plan:   Principal Problem:   Acute metabolic encephalopathy Active Problems:   Cellulitis of right foot   Severe sepsis ([name])   GERD  (gastroesophageal reflux disease)   Depression   Essential hypertension   AKI (acute kidney injury) ([place])   AMS (altered mental status)   Septic shock ([name])   Abscess of right foot   Morbid obesity ([place])   Right foot infection  Acute metabolic encephalopathy Unclear etiology.  Mental status waxing waning.  Suspect element of polypharmacy.  Interestingly repeat urinalysis on 2/21 demonstrate signs of infection.  Patient did also not wear her CPAP last night.  Per RN she refused.  Mental status appears intact a.m. 2/21.   EEG unrevealing CT head unrevealing ABG unrevealing Plan:  Continue holding Percocet Lyrica restarted at reduced dose Per ID no indication for UTI treatment, suspect chronic pyuria Ensure patient wears CPAP nightly   Septic shock - POA - resolved  Right foot cellulitis and abscess at site of prior TMA with history of prior ulceration (+)MRSA S/p return to the OR on 05/27/2022 for repeat washout, revisional amputation and resection of necrotic bone and soft tissue and primary closure of the amputation site. Surgical path no osteomyelitis   Wound culture yielded MRSA.  Patient has been on IV vancomycin and last dose was on 05/29/22.  Vancomycin was discontinued due to AKI Started on daptomycin on 06/02/22 last dose was on 2/18.    AKI with hyperkalemia and hyponatremia Sharp increase in Cr.  AKI is thought to be multifactorial and related to IV contrast and possible hypotension during surgical procedure as well as poor oral intake. Serum creatinine peaked-3.5 and shows a downward trend 3.5 >> 1.08 >> 1.11.  Patient's baseline is about 0.56 DC IVF Avoid nephrotoxic agents   Depression anxiety bipolar disorder history of seizure disorder cont Buspar and Lexapro  cont lamotrigine and Topamax   Chronic pain syndrome recent right shoulder arthroplasty Percocet on hold.  Lyrica restarted at reduced dose   Morbid obesity, BMI [id number] Complicates treatment and  prognosis   Sleep apnea CPAP nightly   Anemia (acute on chronic) Patient with baseline hemoglobin of 10 which has dropped to 7.4 No obvious source of bleeding at this time Aspirin and Lovenox have been on hold Will transfuse for hemoglobin less than 7 Serum iron is within normal limits   Nausea/Vomiting. ??  Diabetic gastroparesis Diarrhea Patient not on Reglan due to history of tardive dyskinesia No improvement with Reglan Patient states the only thing that works for her is Phenergan.  Compazine discontinued.  Phenergan initiated.  Dose was increased to 25 mg every 6 hours Added twice daily Questran for diarrhea     DVT prophylaxis: SCD Code Status: Full Family Communication: None Disposition Plan: Status is: Inpatient Remains inpatient appropriate because: Resolving metabolic encephalopathy.  Severely deconditioned  Level of care: Med-Surg  Consultants:  Psychiatry Neurology  Procedures:  None  Antimicrobials: None   Subjective: Seen and examined.  Appears deconditioned.  Continues to endorse nausea and abdominal pain associate with diarrhea..  Objective: Vitals:   06/10/22 0741 06/10/22 1535 06/10/22 2318 06/11/22 0733  BP: (!) 141/75 (!) 116/55 (!) 147/84 (!) 163/82  Pulse: (!) 59 (!) 57 62 60  Resp: '20 16 20 20  '$ Temp: (!) 97.4 F (36.3 C) 97.9 F (36.6 C) 98.3 F (36.8 C)  98.1 F (36.7 C)  TempSrc: Oral     SpO2: 99% 96% 96% 98%  Weight:      Height:        Intake/Output Summary (Last 24 hours) at 06/11/2022 1414 Last data filed at 06/10/2022 1505 Gross per 24 hour  Intake 150 ml  Output --  Net 150 ml   Filed Weights   05/22/22 1221 05/24/22 1637 05/27/22 1457  Weight: 108.1 kg 108 kg 104.3 kg    Examination:  General exam: Appears fatigued.  Chronically ill Respiratory system: Clear to auscultation. Respiratory effort normal. Cardiovascular system: S1-S2, RRR, no murmurs, no pedal edema Gastrointestinal system: Obese, soft, NT/ND,  hyperactive bowel sounds Central nervous system: Lethargic.  Oriented x 3.  No focal deficits Extremities: Symmetric 5 x 5 power. Skin: No rashes, lesions or ulcers Psychiatry: Judgement and insight appear impaired. Mood & affect flattened.     Data Reviewed: I have personally reviewed following labs and imaging studies  CBC: Recent Labs  Lab 06/05/22 1154 06/07/22 0650 06/08/22 1638  WBC 5.6 5.8  --   HGB 8.1* 7.4* 7.9*  HCT 24.6* 23.2* 24.1*  MCV 95.0 97.9  --   PLT 274 222  --    Basic Metabolic Panel: Recent Labs  Lab 06/05/22 1154 06/06/22 0626 06/07/22 0650 06/07/22 1648 06/08/22 0605  NA 133* 136 139 137 137  K 4.5 4.3 4.4 4.3 4.0  CL 104 107 111 110 110  CO2 '22 24 24 22 22  '$ GLUCOSE 97 96 94 131* 107*  BUN 64* 55* 44* 39* 37*  CREATININE 1.25* 1.08* 1.08* 1.07* 1.11*  CALCIUM 8.0* 7.9* 7.8* 7.9* 7.8*  MG  --   --  3.0*  --   --    GFR: Estimated Creatinine Clearance: 65 mL/min (A) (by C-G formula based on SCr of 1.11 mg/dL (H)). Liver Function Tests: Recent Labs  Lab 06/05/22 1154 06/07/22 0650  AST 11* 11*  ALT 8 10  ALKPHOS 55 62  BILITOT 0.5 0.6  PROT 5.7* 5.4*  ALBUMIN 2.5* 2.3*   No results for input(s): "LIPASE", "AMYLASE" in the last 168 hours. Recent Labs  Lab 06/07/22 1651  AMMONIA 26   Coagulation Profile: No results for input(s): "INR", "PROTIME" in the last 168 hours. Cardiac Enzymes: Recent Labs  Lab 06/08/22 0605  CKTOTAL 13*   BNP (last 3 results) No results for input(s): "PROBNP" in the last 8760 hours. HbA1C: No results for input(s): "HGBA1C" in the last 72 hours. CBG: Recent Labs  Lab 06/10/22 1946 06/10/22 2321 06/11/22 0402 06/11/22 0730 06/11/22 1210  GLUCAP 106* 80 85 95 82   Lipid Profile: No results for input(s): "CHOL", "HDL", "LDLCALC", "TRIG", "CHOLHDL", "LDLDIRECT" in the last 72 hours. Thyroid Function Tests: No results for input(s): "TSH", "T4TOTAL", "FREET4", "T3FREE", "THYROIDAB" in the last 72  hours.  Anemia Panel: No results for input(s): "VITAMINB12", "FOLATE", "FERRITIN", "TIBC", "IRON", "RETICCTPCT" in the last 72 hours. Sepsis Labs: No results for input(s): "PROCALCITON", "LATICACIDVEN" in the last 168 hours.  Recent Results (from the past 240 hour(s))  Gastrointestinal Panel by PCR , Stool     Status: None   Collection Time: 06/10/22  7:30 AM   Specimen: Stool  Result Value Ref Range Status   Campylobacter species NOT DETECTED NOT DETECTED Final   Plesimonas shigelloides NOT DETECTED NOT DETECTED Final   Salmonella species NOT DETECTED NOT DETECTED Final   Yersinia enterocolitica NOT DETECTED NOT DETECTED Final   Vibrio species NOT DETECTED  NOT DETECTED Final   Vibrio cholerae NOT DETECTED NOT DETECTED Final   Enteroaggregative E coli (EAEC) NOT DETECTED NOT DETECTED Final   Enteropathogenic E coli (EPEC) NOT DETECTED NOT DETECTED Final   Enterotoxigenic E coli (ETEC) NOT DETECTED NOT DETECTED Final   Shiga like toxin producing E coli (STEC) NOT DETECTED NOT DETECTED Final   Shigella/Enteroinvasive E coli (EIEC) NOT DETECTED NOT DETECTED Final   Cryptosporidium NOT DETECTED NOT DETECTED Final   Cyclospora cayetanensis NOT DETECTED NOT DETECTED Final   Entamoeba histolytica NOT DETECTED NOT DETECTED Final   Giardia lamblia NOT DETECTED NOT DETECTED Final   Adenovirus F40/41 NOT DETECTED NOT DETECTED Final   Astrovirus NOT DETECTED NOT DETECTED Final   Norovirus GI/GII NOT DETECTED NOT DETECTED Final   Rotavirus A NOT DETECTED NOT DETECTED Final   Sapovirus (I, II, IV, and V) NOT DETECTED NOT DETECTED Final    Comment: Performed at [facility], [address], [place], [place] [address]         Radiology Studies: CT ABDOMEN PELVIS WO CONTRAST  Result Date: 06/11/2022 CLINICAL DATA:  Severe abdominal pain and nausea for several days. Urinary tract infection. EXAM: CT ABDOMEN AND PELVIS WITHOUT CONTRAST TECHNIQUE: Multidetector CT imaging of the  abdomen and pelvis was performed following the standard protocol without IV contrast. RADIATION DOSE REDUCTION: This exam was performed according to the departmental dose-optimization program which includes automated exposure control, adjustment of the mA and/or kV according to patient size and/or use of iterative reconstruction technique. COMPARISON:  06/30/2021 FINDINGS: Lower chest: New small to moderate bilateral pleural effusions, with bilateral lower lobe compressive atelectasis. Hepatobiliary: No mass visualized on this unenhanced exam. Gallbladder is unremarkable. No evidence of biliary ductal dilatation. Mild perihepatic ascites is new since prior study. Pancreas: No mass or inflammatory process visualized on this unenhanced exam. Spleen:  Within normal limits in size. Adrenals/Urinary tract: No evidence of urolithiasis or hydronephrosis. Unremarkable unopacified urinary bladder. Stomach/Bowel: No evidence of obstruction, inflammatory process, or abnormal fluid collections. Diffuse mesenteric and body wall edema, new since prior exam. Vascular/Lymphatic: No pathologically enlarged lymph nodes identified. No evidence of abdominal aortic aneurysm. Reproductive: Prior hysterectomy noted. Adnexal regions are unremarkable in appearance. Other:  None. Musculoskeletal: No suspicious bone lesions identified. Posterior spinal fixation rods again seen in the thoracolumbar spine. IMPRESSION: New perihepatic ascites, diffuse mesenteric and body wall edema, and bilateral pleural effusions, consistent with anasarca. No evidence of focal inflammatory process, abscess, or soft tissue mass. Electronically Signed   By: Marlaine Hind M.D.   On: 06/11/2022 13:42        Scheduled Meds:  busPIRone  15 mg Oral TID   Chlorhexidine Gluconate Cloth  6 each Topical Q0600   Chlorhexidine Gluconate Cloth  6 each Topical Q0600   cholestyramine  4 g Oral BID   escitalopram  10 mg Oral Daily   estradiol  1 mg Oral QHS    feeding supplement  237 mL Oral BID BM   lamoTRIgine  100 mg Oral Daily   pregabalin  50 mg Oral BID   sodium chloride flush  10-40 mL Intracatheter Q12H   sodium chloride flush  3 mL Intravenous Q12H   topiramate  50 mg Oral BID   Continuous Infusions:  sodium chloride Stopped (05/24/22 1920)   cefTRIAXone (ROCEPHIN)  IV 1 g (06/11/22 1355)   promethazine (PHENERGAN) injection (IM or IVPB)       LOS: 20 days     Sidney Ace,  MD [facility]   If 7PM-7AM, please contact night-coverage  06/11/2022, 2:14 PM

## 2022-06-11 NOTE — Plan of Care (Signed)
  Problem: Coping: Goal: Ability to adjust to condition or change in health will improve Outcome: Progressing   Problem: Education: Goal: Ability to describe self-care measures that may prevent or decrease complications (Diabetes Survival Skills Education) will improve Outcome: Progressing   Problem: Skin Integrity: Goal: Risk for impaired skin integrity will decrease Outcome: Progressing   Problem: Safety: Goal: Ability to remain free from injury will improve Outcome: Progressing   Problem: Elimination: Goal: Will not experience complications related to bowel motility Outcome: Progressing

## 2022-06-11 NOTE — Progress Notes (Signed)
   Date of Admission:  05/21/2022     ID: [name] [name] is a 57 y.o. female  Principal Problem:   Acute metabolic encephalopathy Active Problems:   GERD (gastroesophageal reflux disease)   Depression   Essential hypertension   AKI (acute kidney injury) ([place])   Cellulitis of right foot   Severe sepsis ([name])   AMS (altered mental status)   Septic shock ([name])   Abscess of right foot   Morbid obesity ([place])   Right foot infection  Pt with multiple fusion surgeries to the spine, rt reverse shoulder arthroplasty, rt TMA was admitted for altered mental status and infection at the TMA site- CT scan did no show any osteo underwent I/D on 05/22/22 MRSA in culture ( previously it had been MSSA)    Subjective: Continues with nausea   Medications:   busPIRone  15 mg Oral TID   Chlorhexidine Gluconate Cloth  6 each Topical Q0600   Chlorhexidine Gluconate Cloth  6 each Topical Q0600   cholestyramine  4 g Oral BID   escitalopram  10 mg Oral Daily   estradiol  1 mg Oral QHS   lamoTRIgine  100 mg Oral Daily   pregabalin  50 mg Oral BID   sodium chloride flush  10-40 mL Intracatheter Q12H   sodium chloride flush  3 mL Intravenous Q12H   topiramate  50 mg Oral BID    Objective: Vital signs in last 24 hours: Patient Vitals for the past 24 hrs:  BP Temp Pulse Resp SpO2  06/11/22 0733 (!) 163/82 98.1 F (36.7 C) 60 20 98 %  06/10/22 2318 (!) 147/84 98.3 F (36.8 C) 62 20 96 %  06/10/22 1535 (!) 116/55 97.9 F (36.6 C) (!) 57 16 96 %     Left PICC  PHYSICAL EXAM:  In bed the whole time, pale Chest B/l air entry Hss1s2 Abd soft No tenderness Rt foot TMA site is well coapted- no erythema or discharge      Swelling and erythema improved CNS non focal    Lab Results Recent Labs    06/08/22 1638  HGB 7.9*  HCT 24.1*    Microbiology: MRSA from 05/22/22 culture 05/27/22 surgical culture NG Studies/Results: Xray rt foot- no osteo  Pathology from 05/27/22 of the bone no  acute osteo   Assessment/Plan: Rt TMA site infection- underwent I/D and has MRSA infection- was on vanco  Went back for revision TMA on 05/27/22 culture NG and pathology no acute osteo Completed 12 days of appropriate antibiotic  ( dapto) for soft tissue infection Surgical site looking good, no residual infection   Encephalopathy- resolved   Could be from Opioids compounded by AKI hAs been Dc On triple SSRI OSA-    AKI- resolved combination of incomplete emptying ( bladder scan 600cc) vanco/contrast-  started IV fluids  No clinical evidence of UTI- Dc ceftriaxone Pyruia can be present because of foley which has been removed.  Nausea: persistent CT abdomen showed just body wall edema, mesenteric edema and pleural effusion  serum cortisol am okay She needs to sit in the chair   Anemia  Diarrhea- GI panel PCR neg  H/o multiple surgeries to spine  Depression/anxiety on meds  Discussed the management with  patient and her care team ID will sign off- call if needed

## 2022-06-11 NOTE — Care Management Important Message (Signed)
Important Message  Patient Details  Name: ANNIEBELLE LAPIER MRN: [id number] Date of Birth: 03-26-1966   Medicare Important Message Given:  Yes     Juliann Pulse A Terrill Alperin 06/11/2022, 2:11 PM

## 2022-06-11 NOTE — TOC Progression Note (Signed)
Transition of Care [facility]) - CM/SW Discharge Note   Patient Details  Name: [name] [name] MRN: [id number] Date of Birth: 1965-11-26  [facility] of [facility] CM/SW Contact:  Conception Oms, RN Phone Number: 06/11/2022, 9:23 AM   Clinical Narrative:    The patient continues to walk around on the operative foot, she has been having nausea and Loose stool, has completed Antibiotics The plan is for her to go home with her mother and son to help care for her, they are aware that [facility] is not going to be an option she will need to go to Outpatient PT, she has DME at home   Final next level of care: Home/Self Care Barriers to Discharge: Continued Medical Work up   Patient Goals and CMS Choice      Discharge Placement                         Discharge Plan and Services Additional resources added to the After Visit Summary for     Discharge Planning Services: CM Consult Post Acute Care Choice: Home Health          DME Arranged: N/A DME Agency: NA                  Social Determinants of Health (SDOH) Interventions SDOH Screenings   Food Insecurity: No Food Insecurity (05/22/2022)  Housing: Low Risk  (05/22/2022)  Transportation Needs: No Transportation Needs (05/22/2022)  Utilities: Not At Risk (05/22/2022)  Alcohol Screen: Low Risk  (08/21/2020)  Depression (PHQ2-9): Low Risk  (09/18/2021)  Financial Resource Strain: Low Risk  (08/21/2020)  Physical Activity: Inactive (08/21/2020)  Social Connections: Socially Isolated (08/21/2020)  Stress: No Stress Concern Present (08/21/2020)  Tobacco Use: Low Risk  (05/28/2022)     Readmission Risk Interventions     No data to display

## 2022-06-11 NOTE — Plan of Care (Signed)

## 2022-06-11 NOTE — Progress Notes (Signed)
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    PT Comments    Pt sitting on BSC upon PT arrival and requesting back to bed (pt reports having nausea and diarrhea--not feeling well in general).  Pt stood up to walker with CGA (NT present assisting pt with clean up) but then sat down (limited standing d/t generalized weakness and fatigue).  Then performed squat pivot BSC to bed (towards L) with bed rail use with CGA.  Pt SBA sit to supine in bed and declining further activity/therapy d/t not feeling well.  Able to maintain Gracey status during session.  Anticipate pt will need to be w/c level with functional mobility with 24/7 assist d/t generalized weakness/decreased activity tolerance.  PT frequency updated to min 2x/week d/t pt's limited participation with therapy (pt has been performing BSC transfers with nursing assist).  PT POC reviewed and updated.   Recommendations for follow up therapy are one component of a multi-disciplinary discharge planning process, led by the attending physician.  Recommendations may be updated based on patient status, additional functional criteria and insurance authorization.  Follow Up Recommendations  Follow physician's recommendations for discharge plan and follow up therapies Can patient physically be transported by private vehicle: Yes   Assistance Recommended at Discharge Frequent or constant Supervision/Assistance  Patient can return home with the following A little help with walking and/or transfers;A little help with bathing/dressing/bathroom;Assistance with  cooking/housework;Direct supervision/assist for medications management;Direct supervision/assist for financial management;Assist for transportation;Help with stairs or ramp for entrance   Equipment Recommendations  None recommended by PT (pt reports having w/c and RW (pt declining hospital bed))    Recommendations for Other Services       Precautions / Restrictions Precautions Precautions: Fall Restrictions Weight Bearing Restrictions: Yes RUE Weight Bearing: Weight bearing as tolerated RLE Weight Bearing: Non weight bearing     Mobility  Bed Mobility Overal bed mobility: Needs Assistance Bed Mobility: Sit to Supine       Sit to supine: Supervision   General bed mobility comments: increased effort to perform on own    Transfers Overall transfer level: Needs assistance Equipment used: Rolling walker (2 wheels), None Transfers: Sit to/from Stand, Bed to chair/wheelchair/BSC Sit to Stand: Min guard     Squat pivot transfers: Min guard     General transfer comment: pt stood up to walker with CGA (vc's for UE's/LE placement); squat pivot BSC to bed using bed rail CGA    Ambulation/Gait Ambulation/Gait assistance:  (Deferred d/t pt not feeling well)                 Stairs             Wheelchair Mobility    Modified Rankin (Stroke Patients Only)       Balance Overall balance assessment: Needs assistance Sitting-balance support: No upper extremity supported Sitting balance-Leahy Scale: Good Sitting balance - Comments: steady sitting reaching within BOS   Standing balance support: Bilateral upper extremity supported Standing balance-Leahy Scale: Fair Standing balance comment: able to static stand with B UE support (CGA for safety)  Cognition Arousal/Alertness: Awake/alert Behavior During Therapy: Flat affect Overall Cognitive Status: Within Functional Limits for tasks assessed                                           Exercises      General Comments  Nursing cleared pt for participation in physical therapy (nursing aware of pt's nausea and diarrhea).  Pt agreeable to limited PT session.      Pertinent Vitals/Pain Pain Assessment Pain Assessment: 0-10 Pain Score: 7  Pain Location: chronic back pain Pain Descriptors / Indicators: Aching, Sore Pain Intervention(s): Limited activity within patient's tolerance, Monitored during session, Repositioned    Home Living                          Prior Function            PT Goals (current goals can now be found in the care plan section) Acute Rehab PT Goals Patient Stated Goal: go home when I feel better PT Goal Formulation: With patient Time For Goal Achievement: 06/24/22 Potential to Achieve Goals: Fair Progress towards PT goals: Progressing toward goals    Frequency    Min 2X/week      PT Plan Current plan remains appropriate    Co-evaluation              AM-PAC PT "6 Clicks" Mobility   Outcome Measure  Help needed turning from your back to your side while in a flat bed without using bedrails?: None Help needed moving from lying on your back to sitting on the side of a flat bed without using bedrails?: None Help needed moving to and from a bed to a chair (including a wheelchair)?: A Little Help needed standing up from a chair using your arms (e.g., wheelchair or bedside chair)?: A Little Help needed to walk in hospital room?: Total Help needed climbing 3-5 steps with a railing? : Total 6 Click Score: 16    End of Session   Activity Tolerance: Patient limited by fatigue;Other (comment) (activity also limited by nausea) Patient left: in bed;with call bell/phone within reach;with bed alarm set;Other (comment) (R LE elevated on 2 pillows) Nurse Communication: Mobility status;Precautions PT Visit Diagnosis: Muscle weakness (generalized) (M62.81);Difficulty in walking, not elsewhere  classified (R26.2)     Time: [id number] PT Time Calculation (min) (ACUTE ONLY): 14 min  Charges:  $Therapeutic Activity: 8-22 mins                     Leitha Bleak, PT 06/11/22, 11:25 AM

## 2022-06-11 NOTE — Progress Notes (Signed)
Initial Nutrition Assessment  DOCUMENTATION CODES:   Obesity unspecified  INTERVENTION:   -Obtain current wt -Continue with regular diet for widest variety of meal selections -MVI with minerals daily -Ensure Enlive po TID, each supplement provides 350 kcal and 20 grams of protein -Magic cup TID with meals, each supplement provides 290 kcal and 9 grams of protein   NUTRITION DIAGNOSIS:   Inadequate oral intake related to poor appetite, nausea, vomiting as evidenced by per patient/family report.  GOAL:   Patient will meet greater than or equal to 90% of their needs  MONITOR:   PO intake, Supplement acceptance  REASON FOR ASSESSMENT:   Consult Assessment of nutrition requirement/status  ASSESSMENT:   Pt with history of bipolar disorder, total arthroplasty right shoulder December 2023, chronic pain syndrome, anxiety depression, who presented with altered mental status and redness of the right foot amputation stump, was on Keflex at home, TMA 2 mos prior.  Pt admitted with acute metabolic encephalopathy and resolved septic shock secondary to rt foot cellulitis.   2/5- s/p Procedure(s): RIGHT REVISION INCISION AND DRAINAGE/ WASHOUT WITH PRIMARY DELAYED CLOSURE (Right)  Reviewed I/O's: +160 ml x 24 hours and +5.1 L since 05/28/22  Spoke with pt at bedside, who complains of feeling unwell today. She endorses nausea and vomiting, which has improved over the past few days with medications. Per pt, phenergan works best for her. Pt shares that she has eaten "literally nothing in the past 3 days" secondary to nausea, vomiting, and diarrhea. Pt shares she has been mostly eating jello and that intake has been poor for her entire hospitalization secondary to these issues. PTA, pt denies any nutritional issues and was eating well, consuming 3 meals per day and maintaining her weight.   Pt feels most comfortable drinking and enjoys jello, Ensure, and cola. Pt also amenable to trial ice cream  [employer]). She is hopeful that intake will improve with nausea meds and supplements. Discussed importance of good meal and supplement intake to promote healing.   Reviewed wt hx; pt has experienced a 3.4% wt loss over the past 3 months, which is not significant ofr time frame. Pt shares that her UBW is around 250#. She is unsure how much weight she has lost but suspect she has lost weight in the hospital. No new wt since 05/27/22. Due to edema, suspect true wt loss and well as potential fat and muscle depletions are masked.    Lab Results  Component Value Date   HGBA1C 5.3 04/08/2022   PTA DM medications are none.   Labs reviewed: Phos: 9.0, CBGS: 82-95 (inpatient orders for glycemic control are none).    NUTRITION - FOCUSED PHYSICAL EXAM:  Flowsheet Row Most Recent Value  Orbital Region No depletion  Upper Arm Region No depletion  Thoracic and Lumbar Region No depletion  Buccal Region No depletion  Temple Region No depletion  Clavicle Bone Region No depletion  Clavicle and Acromion Bone Region No depletion  Scapular Bone Region No depletion  Dorsal Hand No depletion  Patellar Region No depletion  Anterior Thigh Region No depletion  Posterior Calf Region No depletion  Edema (RD Assessment) Moderate  Hair Reviewed  Eyes Reviewed  Mouth Reviewed  Skin Reviewed  Nails Reviewed       Diet Order:   Diet Order             Diet regular Room service appropriate? Yes; Fluid consistency: Thin  Diet effective now  EDUCATION NEEDS:   Education needs have been addressed  Skin:  Skin Assessment: Skin Integrity Issues: Skin Integrity Issues:: Incisions Incisions: closed rt foot  Last BM:  06/11/22 (type 7)  Height:   Ht Readings from Last 1 Encounters:  05/27/22 '5\' 4"'$  (1.626 m)    Weight:   Wt Readings from Last 1 Encounters:  05/27/22 104.3 kg    Ideal Body Weight:  54.5 kg  BMI:  Body mass index is 39.48 kg/m.  Estimated Nutritional  Needs:   Kcal:  1900-2100  Protein:  105-120 grams  Fluid:  > 1.9 L    Loistine Chance, RD, LDN, [place] Registered Dietitian II Certified Diabetes Care and Education Specialist Please refer to [facility] for RD and/or RD on-call/weekend/after hours pager

## 2022-06-12 DIAGNOSIS — G9341 Metabolic encephalopathy: Secondary | ICD-10-CM | POA: Diagnosis not present

## 2022-06-12 LAB — GLUCOSE, CAPILLARY
Glucose-Capillary: 83 mg/dL (ref 70–99)
Glucose-Capillary: 95 mg/dL (ref 70–99)
Glucose-Capillary: 93 mg/dL (ref 70–99)
Glucose-Capillary: 82 mg/dL (ref 70–99)

## 2022-06-12 LAB — CORTISOL-AM, BLOOD: Cortisol - AM: 5.4 ug/dL — ABNORMAL LOW (ref 6.7–22.6)

## 2022-06-12 MED ORDER — OXYCODONE HCL 5 MG PO TABS: 5.0000 mg | ORAL_TABLET | ORAL | Status: DC | PRN

## 2022-06-12 MED ORDER — OXYCODONE HCL 5 MG PO TABS
5.0000 mg | ORAL_TABLET | ORAL | Status: DC | PRN
  Administered 2022-06-12 (×2): 5 mg via ORAL
  Filled 2022-06-12 (×2): qty 1

## 2022-06-12 MED ORDER — PANTOPRAZOLE SODIUM 40 MG PO TBEC
40.0000 mg | DELAYED_RELEASE_TABLET | Freq: Two times a day (BID) | ORAL | Status: DC
  Administered 2022-06-12 – 2022-06-15 (×7): 40 mg via ORAL
  Filled 2022-06-12 (×7): qty 1

## 2022-06-12 MED ORDER — LORAZEPAM 1 MG PO TABS
1.0000 mg | ORAL_TABLET | Freq: Four times a day (QID) | ORAL | Status: DC | PRN
  Administered 2022-06-12: 1 mg via ORAL
  Filled 2022-06-12: qty 1

## 2022-06-12 MED ORDER — PSYLLIUM 95 % PO PACK
1.0000 | PACK | Freq: Every day | ORAL | Status: DC
  Administered 2022-06-12 – 2022-06-15 (×4): 1 via ORAL
  Filled 2022-06-12 (×4): qty 1

## 2022-06-12 NOTE — Plan of Care (Signed)

## 2022-06-12 NOTE — Progress Notes (Signed)
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  02/02: to ED as above. Septic shock, required brief levo gtt. 02/03: admitted to hospitalist service, underwent I&D w/ podiatry 02/04: stable on IV abx awaiting Cx 02/05: back to OR for revision I&D.   02/06-02/07: stable, planning back to OR 02/08. Cultures from 02/03 (+)MRSA 02/08: to OR for revision of RLE TMA. Per ID, continue vanco and send bone for pathology and culture  02/09: podiatry and ID recs for continue inpatient for now to ensure surgical stabilization and await culture results to guide abx, will likely be here thru the weekend (today is Friday)   02/10-02/11: stable, awaiting cultures 02/12: Surgical pathology resulted: no osteomyelitis. Cr jumped up today, pt reports no urine yesterday. IV fluids. UA no infection, FeNa prerenal. ID and pharmacy following and adjusting abx and other possible nephrotoxic medications. 600 mL UOP today, Cr worsening this afternoon, will give additional 1L fluids, nephrology consulted.  02/13: minimal improvement Cr, worsening hyponatremia, nephrology recs pending. 02/14: Continues to complain about inability to void.  Bladder scan done at noon had 541 cc of urine.  Serum creatinine peaked at 3.56 and today it is 2.75.  Will place Foley catheter 2/15  : Patient seen and examined at bedside lethargic in bed.  Continues to stay on daptomycin pending ID recommendations for further continuation of medication.  2/16 : Patient seen and examined this morning complaining of not feeling well.  No acute overnight events.  Vital stable.   Labs with persistent hyponatremia with sodium of 125.  Renal function with improvement creatinine 1.7.  Patient continued on daptomycin with last dose on 2/18 as per infectious disease Patient was seen and examined this morning.  No overnight events.  Was lethargic at the time of my evaluation.  Hyponatremia improving. Patient pending PICC line placement for frequent blood draws.holding Lyrica as pt has been lethargic and somnolent.  Patient seen and examined this morning.  Still lethargic no overnight events reported.  Patient had PICC line placed for frequent blood draws.  Patient will have last dose of daptomycin today on 2/18 as per ID.  PT needs to evaluate the patient for safe discharge.  Patient is from home initial plan was to return to home however because of deconditioning would need higher level of care at the time of discharge. 2/19: Patient is seen and examined during rounds and remains very lethargic.  Arouses to name call.  No oral intake.  Blood sugar at bedside 106.  Lyrica has been on hold and will also place Cymbalta on hold as well. 2/20: Patient is seen and examined during rounds.  She is more awake this morning and complains of abdominal pain mostly in the periumbilical area and nausea.  Last bowel movement was 02/19 2/21: Mental status improved this morning.  Suspected due to polypharmacy.  Patient did not wear her CPAP last night.  Complains of diffuse abdominal pain.  Urinalysis consistent with infection.  Start antibiotics. 2/22: Mental status improved.  Abdominal pain improved.  Foley discontinued yesterday.  Patient able to void.  Did not wear CPAP again overnight.  Continues to endorse  severe nausea. 2/23: Patient remains with nausea and diarrhea.  Very deconditioned.  Abdominal CT demonstrates diffuse body wall anasarca. 2/24: Patient continues to endorse severe nausea and diarrhea   Assessment & Plan:   Principal Problem:   Acute metabolic encephalopathy Active  Problems:   Cellulitis of right foot   Severe sepsis ([name])   GERD (gastroesophageal reflux disease)   Depression   Essential hypertension   AKI (acute kidney injury) ([place])   AMS (altered mental status)   Septic shock ([name])   Abscess of right foot   Morbid obesity ([place])   Right foot infection  Acute metabolic encephalopathy Unclear etiology.  Mental status waxing waning.  Suspect element of polypharmacy.  Interestingly repeat urinalysis on 2/21 demonstrate signs of infection.  Patient did also not wear her CPAP last night.  Per RN she refused.  Mental status appears intact a.m. 2/21.   EEG unrevealing CT head unrevealing ABG unrevealing Plan:  Restart oxycodone at reduced dose Lyrica restarted at reduced dose Per ID no indication for UTI treatment, suspect chronic pyuria Ensure patient wears CPAP nightly   Septic shock - POA - resolved  Right foot cellulitis and abscess at site of prior TMA with history of prior ulceration (+)MRSA S/p return to the OR on 05/27/2022 for repeat washout, revisional amputation and resection of necrotic bone and soft tissue and primary closure of the amputation site. Surgical path no osteomyelitis   Wound culture yielded MRSA.  Patient has been on IV vancomycin and last dose was on 05/29/22.  Vancomycin was discontinued due to AKI Started on daptomycin on 06/02/22 last dose was on 2/18.    AKI with hyperkalemia and hyponatremia Sharp increase in Cr.  AKI is thought to be multifactorial and related to IV contrast and possible hypotension during surgical procedure as well as poor oral intake. Serum creatinine peaked-3.5 and shows a downward trend 3.5 >> 1.08 >> 1.11.  Patient's baseline is about 0.56 DC IVF Avoid nephrotoxic agents   Depression anxiety bipolar disorder history of seizure disorder cont Buspar and Lexapro  cont lamotrigine and Topamax   Chronic pain syndrome recent right shoulder arthroplasty Oxycodone restarted at low-dose.   Lyrica restarted at reduced dose   Morbid obesity, BMI [id number] Complicates treatment and prognosis   Sleep apnea CPAP nightly   Anemia (acute on chronic) Patient with baseline hemoglobin of 10 which has dropped to 7.4 No obvious source of bleeding at this time Aspirin and Lovenox have been on hold Will transfuse for hemoglobin less than 7 Serum iron is within normal limits   Nausea/Vomiting. ??  Diabetic gastroparesis Diarrhea Patient not on Reglan due to history of tardive dyskinesia No improvement with Reglan Patient states the only thing that works for her is Phenergan.  Compazine discontinued.  Phenergan initiated.  Dose was increased to 25 mg every 4 hours Added twice daily Questran for diarrhea Added psyllium for stool bulking effect Added Percocet for possible opioid withdrawal diarrhea     DVT prophylaxis: SCD Code Status: Full Family Communication: None Disposition Plan: Status is: Inpatient Remains inpatient appropriate because: Resolving metabolic encephalopathy.  Severely deconditioned.  Still with intractable nausea and diarrhea  Level of care: Med-Surg  Consultants:  Psychiatry Neurology  Procedures:  None  Antimicrobials: None   Subjective: Seen and examined.  Appears deconditioned.  Continues to endorse nausea and abdominal pain associate with diarrhea..  Objective: Vitals:   06/11/22 1556 06/11/22 2056 06/11/22 2340 06/12/22 0738  BP: 130/65  (!) 144/74 Marland Kitchen)  150/79  Pulse: 76 74 65 63  Resp: '20 18 19 20  '$ Temp: 98 F (36.7 C)  98.4 F (36.9 C) 99.1 F (37.3 C)  TempSrc:    Oral  SpO2: 98% 97% 99% 95%  Weight:      Height:        Intake/Output Summary (Last 24 hours) at 06/12/2022 1130 Last data filed at 06/12/2022 0301 Gross per 24 hour  Intake 150 ml  Output --  Net 150 ml   Filed Weights   05/22/22 1221 05/24/22 1637 05/27/22 1457  Weight: 108.1 kg 108 kg 104.3 kg    Examination:  General exam: Fatigued.  Chronically  ill Respiratory system: Clear to auscultation. Respiratory effort normal. Cardiovascular system: S1-S2, RRR, no murmurs, no pedal edema Gastrointestinal system: Obese, soft, NT/ND, normal bowel sounds Central nervous system: Lethargic.  Oriented x 3.  No focal deficits Extremities: Symmetric 5 x 5 power. Skin: No rashes, lesions or ulcers Psychiatry: Judgement and insight appear impaired. Mood & affect flattened.     Data Reviewed: I have personally reviewed following labs and imaging studies  CBC: Recent Labs  Lab 06/05/22 1154 06/07/22 0650 06/08/22 1638  WBC 5.6 5.8  --   HGB 8.1* 7.4* 7.9*  HCT 24.6* 23.2* 24.1*  MCV 95.0 97.9  --   PLT 274 222  --    Basic Metabolic Panel: Recent Labs  Lab 06/05/22 1154 06/06/22 0626 06/07/22 0650 06/07/22 1648 06/08/22 0605  NA 133* 136 139 137 137  K 4.5 4.3 4.4 4.3 4.0  CL 104 107 111 110 110  CO2 '22 24 24 22 22  '$ GLUCOSE 97 96 94 131* 107*  BUN 64* 55* 44* 39* 37*  CREATININE 1.25* 1.08* 1.08* 1.07* 1.11*  CALCIUM 8.0* 7.9* 7.8* 7.9* 7.8*  MG  --   --  3.0*  --   --    GFR: Estimated Creatinine Clearance: 65 mL/min (A) (by C-G formula based on SCr of 1.11 mg/dL (H)). Liver Function Tests: Recent Labs  Lab 06/05/22 1154 06/07/22 0650  AST 11* 11*  ALT 8 10  ALKPHOS 55 62  BILITOT 0.5 0.6  PROT 5.7* 5.4*  ALBUMIN 2.5* 2.3*   No results for input(s): "LIPASE", "AMYLASE" in the last 168 hours. Recent Labs  Lab 06/07/22 1651  AMMONIA 26   Coagulation Profile: No results for input(s): "INR", "PROTIME" in the last 168 hours. Cardiac Enzymes: Recent Labs  Lab 06/08/22 0605  CKTOTAL 13*   BNP (last 3 results) No results for input(s): "PROBNP" in the last 8760 hours. HbA1C: No results for input(s): "HGBA1C" in the last 72 hours. CBG: Recent Labs  Lab 06/11/22 0730 06/11/22 1210 06/11/22 1642 06/11/22 2045 06/12/22 0728  GLUCAP 95 82 93 89 83   Lipid Profile: No results for input(s): "CHOL", "HDL",  "LDLCALC", "TRIG", "CHOLHDL", "LDLDIRECT" in the last 72 hours. Thyroid Function Tests: No results for input(s): "TSH", "T4TOTAL", "FREET4", "T3FREE", "THYROIDAB" in the last 72 hours.  Anemia Panel: No results for input(s): "VITAMINB12", "FOLATE", "FERRITIN", "TIBC", "IRON", "RETICCTPCT" in the last 72 hours. Sepsis Labs: No results for input(s): "PROCALCITON", "LATICACIDVEN" in the last 168 hours.  Recent Results (from the past 240 hour(s))  Gastrointestinal Panel by PCR , Stool     Status: None   Collection Time: 06/10/22  7:30 AM   Specimen: Stool  Result Value Ref Range Status   Campylobacter species NOT DETECTED NOT DETECTED Final   Plesimonas shigelloides NOT DETECTED NOT DETECTED Final  Salmonella species NOT DETECTED NOT DETECTED Final   Yersinia enterocolitica NOT DETECTED NOT DETECTED Final   Vibrio species NOT DETECTED NOT DETECTED Final   Vibrio cholerae NOT DETECTED NOT DETECTED Final   Enteroaggregative E coli (EAEC) NOT DETECTED NOT DETECTED Final   Enteropathogenic E coli (EPEC) NOT DETECTED NOT DETECTED Final   Enterotoxigenic E coli (ETEC) NOT DETECTED NOT DETECTED Final   Shiga like toxin producing E coli (STEC) NOT DETECTED NOT DETECTED Final   Shigella/Enteroinvasive E coli (EIEC) NOT DETECTED NOT DETECTED Final   Cryptosporidium NOT DETECTED NOT DETECTED Final   Cyclospora cayetanensis NOT DETECTED NOT DETECTED Final   Entamoeba histolytica NOT DETECTED NOT DETECTED Final   Giardia lamblia NOT DETECTED NOT DETECTED Final   Adenovirus F40/41 NOT DETECTED NOT DETECTED Final   Astrovirus NOT DETECTED NOT DETECTED Final   Norovirus GI/GII NOT DETECTED NOT DETECTED Final   Rotavirus A NOT DETECTED NOT DETECTED Final   Sapovirus (I, II, IV, and V) NOT DETECTED NOT DETECTED Final    Comment: Performed at [facility], [address], [place], [place] [address]         Radiology Studies: CT ABDOMEN PELVIS WO CONTRAST  Result Date:  06/11/2022 CLINICAL DATA:  Severe abdominal pain and nausea for several days. Urinary tract infection. EXAM: CT ABDOMEN AND PELVIS WITHOUT CONTRAST TECHNIQUE: Multidetector CT imaging of the abdomen and pelvis was performed following the standard protocol without IV contrast. RADIATION DOSE REDUCTION: This exam was performed according to the departmental dose-optimization program which includes automated exposure control, adjustment of the mA and/or kV according to patient size and/or use of iterative reconstruction technique. COMPARISON:  06/30/2021 FINDINGS: Lower chest: New small to moderate bilateral pleural effusions, with bilateral lower lobe compressive atelectasis. Hepatobiliary: No mass visualized on this unenhanced exam. Gallbladder is unremarkable. No evidence of biliary ductal dilatation. Mild perihepatic ascites is new since prior study. Pancreas: No mass or inflammatory process visualized on this unenhanced exam. Spleen:  Within normal limits in size. Adrenals/Urinary tract: No evidence of urolithiasis or hydronephrosis. Unremarkable unopacified urinary bladder. Stomach/Bowel: No evidence of obstruction, inflammatory process, or abnormal fluid collections. Diffuse mesenteric and body wall edema, new since prior exam. Vascular/Lymphatic: No pathologically enlarged lymph nodes identified. No evidence of abdominal aortic aneurysm. Reproductive: Prior hysterectomy noted. Adnexal regions are unremarkable in appearance. Other:  None. Musculoskeletal: No suspicious bone lesions identified. Posterior spinal fixation rods again seen in the thoracolumbar spine. IMPRESSION: New perihepatic ascites, diffuse mesenteric and body wall edema, and bilateral pleural effusions, consistent with anasarca. No evidence of focal inflammatory process, abscess, or soft tissue mass. Electronically Signed   By: Marlaine Hind M.D.   On: 06/11/2022 13:42        Scheduled Meds:  busPIRone  15 mg Oral TID   Chlorhexidine  Gluconate Cloth  6 each Topical Q0600   Chlorhexidine Gluconate Cloth  6 each Topical Q0600   cholestyramine  4 g Oral BID   DULoxetine  20 mg Oral Daily   escitalopram  10 mg Oral Daily   estradiol  1 mg Oral QHS   feeding supplement  237 mL Oral TID BM   lamoTRIgine  100 mg Oral Daily   multivitamin with minerals  1 tablet Oral Daily   pantoprazole  40 mg Oral BID   pregabalin  50 mg Oral BID   psyllium  1 packet Oral Daily   sodium chloride flush  10-40 mL Intracatheter Q12H   sodium chloride flush  3 mL Intravenous Q12H   topiramate  50 mg Oral BID   Continuous Infusions:  sodium chloride Stopped (05/24/22 1920)   promethazine (PHENERGAN) injection (IM or IVPB) 25 mg (06/12/22 1002)     LOS: 21 days     Sidney Ace, MD [facility]   If 7PM-7AM, please contact night-coverage  06/12/2022, 11:30 AM

## 2022-06-13 DIAGNOSIS — G9341 Metabolic encephalopathy: Secondary | ICD-10-CM | POA: Diagnosis not present

## 2022-06-13 LAB — GLUCOSE, CAPILLARY
Glucose-Capillary: 102 mg/dL — ABNORMAL HIGH (ref 70–99)
Glucose-Capillary: 102 mg/dL — ABNORMAL HIGH (ref 70–99)
Glucose-Capillary: 106 mg/dL — ABNORMAL HIGH (ref 70–99)
Glucose-Capillary: 86 mg/dL (ref 70–99)
Glucose-Capillary: 89 mg/dL (ref 70–99)
Glucose-Capillary: 89 mg/dL (ref 70–99)

## 2022-06-13 MED ORDER — OXYCODONE HCL 5 MG PO TABS
5.0000 mg | ORAL_TABLET | Freq: Four times a day (QID) | ORAL | Status: DC
  Administered 2022-06-13 – 2022-06-15 (×10): 5 mg via ORAL
  Filled 2022-06-13 (×10): qty 1

## 2022-06-13 NOTE — Progress Notes (Signed)
Pt refused bladder scan this am

## 2022-06-13 NOTE — Progress Notes (Signed)
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  02/02: to ED as above. Septic shock, required brief levo gtt. 02/03: admitted to hospitalist service, underwent I&D w/ podiatry 02/04: stable on IV abx awaiting Cx 02/05: back to OR for revision I&D.   02/06-02/07: stable, planning back to OR 02/08. Cultures from 02/03 (+)MRSA 02/08: to OR for revision of RLE TMA. Per ID, continue vanco and send bone for pathology and culture  02/09: podiatry and ID recs for continue inpatient for now to ensure surgical stabilization and await culture results to guide abx, will likely be here thru the weekend (today is Friday)   02/10-02/11: stable, awaiting cultures 02/12: Surgical pathology resulted: no osteomyelitis. Cr jumped up today, pt reports no urine yesterday. IV fluids. UA no infection, FeNa prerenal. ID and pharmacy following and adjusting abx and other possible nephrotoxic medications. 600 mL UOP today, Cr worsening this afternoon, will give additional 1L fluids, nephrology consulted.  02/13: minimal improvement Cr, worsening hyponatremia, nephrology recs pending. 02/14: Continues to complain about inability to void.  Bladder scan done at noon had 541 cc of urine.  Serum creatinine peaked at 3.56 and today it is 2.75.  Will place Foley catheter 2/15  : Patient seen and examined at bedside lethargic in bed.  Continues to stay on daptomycin pending ID recommendations for further continuation of medication.  2/16 : Patient seen and examined this morning complaining of not feeling well.  No acute overnight events.  Vital stable.   Labs with persistent hyponatremia with sodium of 125.  Renal function with improvement creatinine 1.7.  Patient continued on daptomycin with last dose on 2/18 as per infectious disease Patient was seen and examined this morning.  No overnight events.  Was lethargic at the time of my evaluation.  Hyponatremia improving. Patient pending PICC line placement for frequent blood draws.holding Lyrica as pt has been lethargic and somnolent.  Patient seen and examined this morning.  Still lethargic no overnight events reported.  Patient had PICC line placed for frequent blood draws.  Patient will have last dose of daptomycin today on 2/18 as per ID.  PT needs to evaluate the patient for safe discharge.  Patient is from home initial plan was to return to home however because of deconditioning would need higher level of care at the time of discharge. 2/19: Patient is seen and examined during rounds and remains very lethargic.  Arouses to name call.  No oral intake.  Blood sugar at bedside 106.  Lyrica has been on hold and will also place Cymbalta on hold as well. 2/20: Patient is seen and examined during rounds.  She is more awake this morning and complains of abdominal pain mostly in the periumbilical area and nausea.  Last bowel movement was 02/19 2/21: Mental status improved this morning.  Suspected due to polypharmacy.  Patient did not wear her CPAP last night.  Complains of diffuse abdominal pain.  Urinalysis consistent with infection.  Start antibiotics. 2/22: Mental status improved.  Abdominal pain improved.  Foley discontinued yesterday.  Patient able to void.  Did not wear CPAP again overnight.  Continues to endorse  severe nausea. 2/23: Patient remains with nausea and diarrhea.  Very deconditioned.  Abdominal CT demonstrates diffuse body wall anasarca. 2/24: Patient continues to endorse severe nausea and diarrhea 2/25: Home Percocet restarted at low-dose.  Patient experiencing some improvement in  symptoms   Assessment & Plan:   Principal Problem:   Acute metabolic encephalopathy Active Problems:   Cellulitis of right foot   Severe sepsis ([name])   GERD (gastroesophageal reflux disease)   Depression   Essential hypertension   AKI (acute kidney injury) ([place])   AMS (altered mental status)   Septic shock ([name])   Abscess of right foot   Morbid obesity ([facility] [place])   Right foot infection  Acute metabolic encephalopathy Unclear etiology.  Mental status waxing waning.  Suspect element of polypharmacy.  Interestingly repeat urinalysis on 2/21 demonstrate signs of infection.  Patient did also not wear her CPAP last night.  Per RN she refused.  Mental status appears intact a.m. 2/21.   EEG unrevealing CT head unrevealing ABG unrevealing Plan:  Restart oxycodone at reduced dose, scheduled 5 mg every 6 hours Lyrica restarted at reduced dose Per ID no indication for UTI treatment, suspect chronic pyuria Ensure patient wears CPAP nightly   Septic shock - POA - resolved  Right foot cellulitis and abscess at site of prior TMA with history of prior ulceration (+)MRSA S/p return to the OR on 05/27/2022 for repeat washout, revisional amputation and resection of necrotic bone and soft tissue and primary closure of the amputation site. Surgical path no osteomyelitis   Wound culture yielded MRSA.  Patient has been on IV vancomycin and last dose was on 05/29/22.  Vancomycin was discontinued due to AKI Started on daptomycin on 06/02/22 last dose was on 2/18.    AKI with hyperkalemia and hyponatremia Sharp increase in Cr.  AKI is thought to be multifactorial and related to IV contrast and possible hypotension during surgical procedure as well as poor oral intake. Serum creatinine peaked-3.5 and shows a downward trend 3.5 >> 1.08 >> 1.11.  Patient's baseline is about 0.56 DC IVF Avoid nephrotoxic agents   Depression anxiety bipolar disorder history of seizure disorder cont Buspar and Lexapro   cont lamotrigine and Topamax   Chronic pain syndrome recent right shoulder arthroplasty Oxycodone restarted at low-dose.  Lyrica restarted at reduced dose   Morbid obesity, BMI [id number] Complicates treatment and prognosis   Sleep apnea CPAP nightly   Anemia (acute on chronic) Patient with baseline hemoglobin of 10 which has dropped to 7.4 No obvious source of bleeding at this time Aspirin and Lovenox have been on hold Will transfuse for hemoglobin less than 7 Serum iron is within normal limits   Nausea/Vomiting. ??  Diabetic gastroparesis Diarrhea Patient not on Reglan due to history of tardive dyskinesia No improvement with Reglan Patient states the only thing that works for her is Phenergan.  Compazine discontinued.  Phenergan initiated.  Dose was increased to 25 mg every 4 hours Added twice daily Questran for diarrhea Added psyllium for stool bulking effect Added Percocet for possible opioid withdrawal diarrhea, improving     DVT prophylaxis: SCD Code Status: Full Family Communication: None Disposition Plan: Status is: Inpatient Remains inpatient appropriate because: Resolving metabolic encephalopathy.  Severely deconditioned.  Still with intractable nausea and diarrhea  Level of care: Med-Surg  Consultants:  Psychiatry Neurology  Procedures:  None  Antimicrobials: None   Subjective: Seen and examined.  Appears deconditioned.  Nausea and diarrhea improved Objective: Vitals:   06/12/22 [id number]  06/12/22 1624 06/13/22 0015 06/13/22 0755  BP: (!) 150/79 (!) 149/79 (!) 150/79 (!) 166/87  Pulse: 63 62 60 65  Resp: '20 18 20 16  '$ Temp: 99.1 F (37.3 C) 98.1 F (36.7 C) 97.9 F (36.6 C) 98.2 F (36.8 C)  TempSrc: Oral     SpO2: 95% 98% 100% 99%  Weight:      Height:        Intake/Output Summary (Last 24 hours) at 06/13/2022 1010 Last data filed at 06/12/2022 2300 Gross per 24 hour  Intake --  Output 300 ml  Net -300 ml   Filed Weights   05/22/22 1221  05/24/22 1637 05/27/22 1457  Weight: 108.1 kg 108 kg 104.3 kg    Examination:  General exam: Appears fatigued, chronically ill Respiratory system: Clear to auscultation. Respiratory effort normal. Cardiovascular system: S1-S2, RRR, no murmurs, no pedal edema Gastrointestinal system: Obese, soft, NT/ND, normal bowel sounds Central nervous system: Awake, fatigued, oriented x 3, no focal deficits Extremities: Symmetric 5 x 5 power. Skin: No rashes, lesions or ulcers Psychiatry: Judgement and insight appear impaired. Mood & affect flattened.     Data Reviewed: I have personally reviewed following labs and imaging studies  CBC: Recent Labs  Lab 06/07/22 0650 06/08/22 1638  WBC 5.8  --   HGB 7.4* 7.9*  HCT 23.2* 24.1*  MCV 97.9  --   PLT 222  --    Basic Metabolic Panel: Recent Labs  Lab 06/07/22 0650 06/07/22 1648 06/08/22 0605  NA 139 137 137  K 4.4 4.3 4.0  CL 111 110 110  CO2 '24 22 22  '$ GLUCOSE 94 131* 107*  BUN 44* 39* 37*  CREATININE 1.08* 1.07* 1.11*  CALCIUM 7.8* 7.9* 7.8*  MG 3.0*  --   --    GFR: Estimated Creatinine Clearance: 65 mL/min (A) (by C-G formula based on SCr of 1.11 mg/dL (H)). Liver Function Tests: Recent Labs  Lab 06/07/22 0650  AST 11*  ALT 10  ALKPHOS 62  BILITOT 0.6  PROT 5.4*  ALBUMIN 2.3*   No results for input(s): "LIPASE", "AMYLASE" in the last 168 hours. Recent Labs  Lab 06/07/22 1651  AMMONIA 26   Coagulation Profile: No results for input(s): "INR", "PROTIME" in the last 168 hours. Cardiac Enzymes: Recent Labs  Lab 06/08/22 0605  CKTOTAL 13*   BNP (last 3 results) No results for input(s): "PROBNP" in the last 8760 hours. HbA1C: No results for input(s): "HGBA1C" in the last 72 hours. CBG: Recent Labs  Lab 06/12/22 1642 06/12/22 1930 06/13/22 0017 06/13/22 0359 06/13/22 0751  GLUCAP 93 82 86 106* 89   Lipid Profile: No results for input(s): "CHOL", "HDL", "LDLCALC", "TRIG", "CHOLHDL", "LDLDIRECT" in the  last 72 hours. Thyroid Function Tests: No results for input(s): "TSH", "T4TOTAL", "FREET4", "T3FREE", "THYROIDAB" in the last 72 hours.  Anemia Panel: No results for input(s): "VITAMINB12", "FOLATE", "FERRITIN", "TIBC", "IRON", "RETICCTPCT" in the last 72 hours. Sepsis Labs: No results for input(s): "PROCALCITON", "LATICACIDVEN" in the last 168 hours.  Recent Results (from the past 240 hour(s))  Gastrointestinal Panel by PCR , Stool     Status: None   Collection Time: 06/10/22  7:30 AM   Specimen: Stool  Result Value Ref Range Status   Campylobacter species NOT DETECTED NOT DETECTED Final   Plesimonas shigelloides NOT DETECTED NOT DETECTED Final   Salmonella species NOT DETECTED NOT DETECTED Final   Yersinia enterocolitica NOT DETECTED NOT DETECTED Final   Vibrio species NOT DETECTED NOT  DETECTED Final   Vibrio cholerae NOT DETECTED NOT DETECTED Final   Enteroaggregative E coli (EAEC) NOT DETECTED NOT DETECTED Final   Enteropathogenic E coli (EPEC) NOT DETECTED NOT DETECTED Final   Enterotoxigenic E coli (ETEC) NOT DETECTED NOT DETECTED Final   Shiga like toxin producing E coli (STEC) NOT DETECTED NOT DETECTED Final   Shigella/Enteroinvasive E coli (EIEC) NOT DETECTED NOT DETECTED Final   Cryptosporidium NOT DETECTED NOT DETECTED Final   Cyclospora cayetanensis NOT DETECTED NOT DETECTED Final   Entamoeba histolytica NOT DETECTED NOT DETECTED Final   Giardia lamblia NOT DETECTED NOT DETECTED Final   Adenovirus F40/41 NOT DETECTED NOT DETECTED Final   Astrovirus NOT DETECTED NOT DETECTED Final   Norovirus GI/GII NOT DETECTED NOT DETECTED Final   Rotavirus A NOT DETECTED NOT DETECTED Final   Sapovirus (I, II, IV, and V) NOT DETECTED NOT DETECTED Final    Comment: Performed at [facility], [address], [place], [place] [address]         Radiology Studies: CT ABDOMEN PELVIS WO CONTRAST  Result Date: 06/11/2022 CLINICAL DATA:  Severe abdominal pain and nausea for  several days. Urinary tract infection. EXAM: CT ABDOMEN AND PELVIS WITHOUT CONTRAST TECHNIQUE: Multidetector CT imaging of the abdomen and pelvis was performed following the standard protocol without IV contrast. RADIATION DOSE REDUCTION: This exam was performed according to the departmental dose-optimization program which includes automated exposure control, adjustment of the mA and/or kV according to patient size and/or use of iterative reconstruction technique. COMPARISON:  06/30/2021 FINDINGS: Lower chest: New small to moderate bilateral pleural effusions, with bilateral lower lobe compressive atelectasis. Hepatobiliary: No mass visualized on this unenhanced exam. Gallbladder is unremarkable. No evidence of biliary ductal dilatation. Mild perihepatic ascites is new since prior study. Pancreas: No mass or inflammatory process visualized on this unenhanced exam. Spleen:  Within normal limits in size. Adrenals/Urinary tract: No evidence of urolithiasis or hydronephrosis. Unremarkable unopacified urinary bladder. Stomach/Bowel: No evidence of obstruction, inflammatory process, or abnormal fluid collections. Diffuse mesenteric and body wall edema, new since prior exam. Vascular/Lymphatic: No pathologically enlarged lymph nodes identified. No evidence of abdominal aortic aneurysm. Reproductive: Prior hysterectomy noted. Adnexal regions are unremarkable in appearance. Other:  None. Musculoskeletal: No suspicious bone lesions identified. Posterior spinal fixation rods again seen in the thoracolumbar spine. IMPRESSION: New perihepatic ascites, diffuse mesenteric and body wall edema, and bilateral pleural effusions, consistent with anasarca. No evidence of focal inflammatory process, abscess, or soft tissue mass. Electronically Signed   By: Marlaine Hind M.D.   On: 06/11/2022 13:42        Scheduled Meds:  busPIRone  15 mg Oral TID   Chlorhexidine Gluconate Cloth  6 each Topical Q0600   Chlorhexidine Gluconate  Cloth  6 each Topical Q0600   cholestyramine  4 g Oral BID   DULoxetine  20 mg Oral Daily   escitalopram  10 mg Oral Daily   estradiol  1 mg Oral QHS   feeding supplement  237 mL Oral TID BM   lamoTRIgine  100 mg Oral Daily   multivitamin with minerals  1 tablet Oral Daily   oxyCODONE  5 mg Oral Q6H   pantoprazole  40 mg Oral BID   pregabalin  50 mg Oral BID   psyllium  1 packet Oral Daily   sodium chloride flush  10-40 mL Intracatheter Q12H   sodium chloride flush  3 mL Intravenous Q12H   topiramate  50 mg Oral BID   Continuous  Infusions:  sodium chloride Stopped (05/24/22 1920)   promethazine (PHENERGAN) injection (IM or IVPB) 25 mg (06/12/22 1002)     LOS: 22 days     Sidney Ace, MD [facility]   If 7PM-7AM, please contact night-coverage  06/13/2022, 10:10 AM

## 2022-06-14 DIAGNOSIS — G9341 Metabolic encephalopathy: Secondary | ICD-10-CM | POA: Diagnosis not present

## 2022-06-14 LAB — GLUCOSE, CAPILLARY
Glucose-Capillary: 111 mg/dL — ABNORMAL HIGH (ref 70–99)
Glucose-Capillary: 121 mg/dL — ABNORMAL HIGH (ref 70–99)
Glucose-Capillary: 84 mg/dL (ref 70–99)
Glucose-Capillary: 95 mg/dL (ref 70–99)
Glucose-Capillary: 97 mg/dL (ref 70–99)
Glucose-Capillary: 98 mg/dL (ref 70–99)

## 2022-06-14 MED ORDER — PREGABALIN 75 MG PO CAPS
75.0000 mg | ORAL_CAPSULE | Freq: Two times a day (BID) | ORAL | Status: DC
  Administered 2022-06-14 – 2022-06-15 (×2): 75 mg via ORAL
  Filled 2022-06-14 (×2): qty 1

## 2022-06-14 NOTE — Progress Notes (Signed)
Physical Therapy Treatment Patient Details Name: [name] [name] MRN: [id number] DOB: Nov 16, 1965 Today's Date: 06/14/2022   History of Present Illness 57 year old female with history of bipolar disorder, total arthroplasty right shoulder April 14, 2022, chronic pain syndrome, anxiety depression, recent right toe amputation who presented to emergency room with altered mental status and redness of the right foot amputation.    PT Comments    Pt resting in bed upon PT arrival; agreeable to therapy.  During session pt SBA semi-supine to sitting edge of bed and CGA to min assist to laterally scoot to R bed to recliner (armrest removed).  Pt reporting R shoulder has been sore since using B UE's on bedrails (hands above her head) to boost her self up in bed a couple days ago--pt reporting R shoulder feels sore when using but didn't have any pain at rest.  Discussed using slideboard for transfer bed to recliner but pt declined.  After lateral scoot to recliner, discussed slideboard use again and pt then agreeable to trying except not this session anymore d/t being too fatigued (pt reports her manual w/c at home has removable armrests and could use slideboard with it).  Pt declining SNF and requesting home discharge (pt reports she sleeps in lift chair and has manual w/c and scooter at home); pt also reporting having strong family member that can assist her with transfers as needed.   Recommendations for follow up therapy are one component of a multi-disciplinary discharge planning process, led by the attending physician.  Recommendations may be updated based on patient status, additional functional criteria and insurance authorization.  Follow Up Recommendations  Follow physician's recommendations for discharge plan and follow up therapies Can patient physically be transported by private vehicle: Yes   Assistance Recommended at Discharge Frequent or constant Supervision/Assistance  Patient can return  home with the following A little help with walking and/or transfers;A little help with bathing/dressing/bathroom;Assistance with cooking/housework;Direct supervision/assist for medications management;Direct supervision/assist for financial management;Assist for transportation;Help with stairs or ramp for entrance   Equipment Recommendations  Other (comment) (slideboard; pt has manual w/c and RW at home already (pt declining hospital bed--sleeps in recliner))    Recommendations for Other Services       Precautions / Restrictions Precautions Precautions: Fall Restrictions Weight Bearing Restrictions: Yes RUE Weight Bearing: Weight bearing as tolerated RLE Weight Bearing: Non weight bearing     Mobility  Bed Mobility Overal bed mobility: Needs Assistance Bed Mobility: Supine to Sit     Supine to sit: Supervision, HOB elevated     General bed mobility comments: increased effort to perform on own    Transfers Overall transfer level: Needs assistance Equipment used: None Transfers: Bed to chair/wheelchair/BSC            Lateral/Scoot Transfers: Min assist General transfer comment: vc's for head/hips relationship and positioning to make lateral scooting easier; pt requiring multiple lateral scoots to go bed to recliner towards R side (armrest removed)    Ambulation/Gait               General Gait Details: pt declined further activity d/t fatigue   Stairs             Wheelchair Mobility    Modified Rankin (Stroke Patients Only)       Balance Overall balance assessment: Needs assistance Sitting-balance support: No upper extremity supported Sitting balance-Leahy Scale: Good Sitting balance - Comments: steady sitting reaching within BOS  Cognition Arousal/Alertness: Awake/alert Behavior During Therapy: Flat affect Overall Cognitive Status: Within Functional Limits for tasks assessed                                           Exercises      General Comments  Nursing cleared pt for participation in physical therapy.  Pt agreeable to PT session.      Pertinent Vitals/Pain Pain Assessment Pain Assessment: 0-10 Pain Score: 6  Pain Location: chronic back pain Pain Descriptors / Indicators: Aching, Sore Pain Intervention(s): Limited activity within patient's tolerance, Monitored during session, Repositioned, Patient requesting pain meds-RN notified Vitals (HR and O2 on room air) stable and WFL throughout treatment session.    Home Living                          Prior Function            PT Goals (current goals can now be found in the care plan section) Acute Rehab PT Goals Patient Stated Goal: go home when I feel better PT Goal Formulation: With patient Time For Goal Achievement: 06/24/22 Potential to Achieve Goals: Fair Additional Goals Additional Goal #1: Pt able to propel manual w/c x150 feet modified independently for safe mobility within home. Progress towards PT goals: Progressing toward goals    Frequency    Min 2X/week      PT Plan Current plan remains appropriate    Co-evaluation              AM-PAC PT "6 Clicks" Mobility   Outcome Measure  Help needed turning from your back to your side while in a flat bed without using bedrails?: None Help needed moving from lying on your back to sitting on the side of a flat bed without using bedrails?: None Help needed moving to and from a bed to a chair (including a wheelchair)?: A Little Help needed standing up from a chair using your arms (e.g., wheelchair or bedside chair)?: A Little Help needed to walk in hospital room?: Total Help needed climbing 3-5 steps with a railing? : Total 6 Click Score: 16    End of Session Equipment Utilized During Treatment: Gait belt Activity Tolerance: Patient limited by fatigue Patient left: in chair;with call bell/phone within reach;with chair  alarm set;Other (comment) (R LE elevated on 2 pillows) Nurse Communication: Mobility status;Precautions;Patient requests pain meds PT Visit Diagnosis: Muscle weakness (generalized) (M62.81);Difficulty in walking, not elsewhere classified (R26.2)     Time: [id number] PT Time Calculation (min) (ACUTE ONLY): 27 min  Charges:  $Therapeutic Activity: 23-37 mins                     Leitha Bleak, PT 06/14/22, 2:59 PM

## 2022-06-14 NOTE — Plan of Care (Signed)
  Problem: Education: Goal: Knowledge of General Education information will improve Description: Including pain rating scale, medication(s)/side effects and non-pharmacologic comfort measures Outcome: Progressing   Problem: Health Behavior/Discharge Planning: Goal: Ability to manage health-related needs will improve Outcome: Progressing   Problem: Clinical Measurements: Goal: Ability to maintain clinical measurements within normal limits will improve Outcome: Progressing Goal: Will remain free from infection Outcome: Progressing Goal: Diagnostic test results will improve Outcome: Progressing Goal: Respiratory complications will improve Outcome: Progressing Goal: Cardiovascular complication will be avoided Outcome: Progressing   Problem: Activity: Goal: Risk for activity intolerance will decrease Outcome: Progressing   Problem: Nutrition: Goal: Adequate nutrition will be maintained Outcome: Progressing   Problem: Elimination: Goal: Will not experience complications related to bowel motility Outcome: Progressing Goal: Will not experience complications related to urinary retention Outcome: Progressing   Problem: Pain Managment: Goal: General experience of comfort will improve Outcome: Progressing   Problem: Safety: Goal: Ability to remain free from injury will improve Outcome: Progressing   Problem: Skin Integrity: Goal: Risk for impaired skin integrity will decrease Outcome: Progressing   Problem: Metabolic: Goal: Ability to maintain appropriate glucose levels will improve Outcome: Progressing   Problem: Nutritional: Goal: Maintenance of adequate nutrition will improve Outcome: Progressing Goal: Progress toward achieving an optimal weight will improve Outcome: Progressing   Problem: Skin Integrity: Goal: Risk for impaired skin integrity will decrease Outcome: Progressing

## 2022-06-14 NOTE — Progress Notes (Signed)
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  02/02: to ED as above. Septic shock, required brief levo gtt. 02/03: admitted to hospitalist service, underwent I&D w/ podiatry 02/04: stable on IV abx awaiting Cx 02/05: back to OR for revision I&D.   02/06-02/07: stable, planning back to OR 02/08. Cultures from 02/03 (+)MRSA 02/08: to OR for revision of RLE TMA. Per ID, continue vanco and send bone for pathology and culture  02/09: podiatry and ID recs for continue inpatient for now to ensure surgical stabilization and await culture results to guide abx, will likely be here thru the weekend (today is Friday)   02/10-02/11: stable, awaiting cultures 02/12: Surgical pathology resulted: no osteomyelitis. Cr jumped up today, pt reports no urine yesterday. IV fluids. UA no infection, FeNa prerenal. ID and pharmacy following and adjusting abx and other possible nephrotoxic medications. 600 mL UOP today, Cr worsening this afternoon, will give additional 1L fluids, nephrology consulted.  02/13: minimal improvement Cr, worsening hyponatremia, nephrology recs pending. 02/14: Continues to complain about inability to void.  Bladder scan done at noon had 541 cc of urine.  Serum creatinine peaked at 3.56 and today it is 2.75.  Will place Foley catheter 2/15  : Patient seen and examined at bedside lethargic in bed.  Continues to stay on daptomycin pending ID recommendations for further continuation of medication.  2/16 : Patient seen and examined this morning complaining of not feeling well.  No acute overnight events.  Vital stable.   Labs with persistent hyponatremia with sodium of 125.  Renal function with improvement creatinine 1.7.  Patient continued on daptomycin with last dose on 2/18 as per infectious disease Patient was seen and examined this morning.  No overnight events.  Was lethargic at the time of my evaluation.  Hyponatremia improving. Patient pending PICC line placement for frequent blood draws.holding Lyrica as pt has been lethargic and somnolent.  Patient seen and examined this morning.  Still lethargic no overnight events reported.  Patient had PICC line placed for frequent blood draws.  Patient will have last dose of daptomycin today on 2/18 as per ID.  PT needs to evaluate the patient for safe discharge.  Patient is from home initial plan was to return to home however because of deconditioning would need higher level of care at the time of discharge. 2/19: Patient is seen and examined during rounds and remains very lethargic.  Arouses to name call.  No oral intake.  Blood sugar at bedside 106.  Lyrica has been on hold and will also place Cymbalta on hold as well. 2/20: Patient is seen and examined during rounds.  She is more awake this morning and complains of abdominal pain mostly in the periumbilical area and nausea.  Last bowel movement was 02/19 2/21: Mental status improved this morning.  Suspected due to polypharmacy.  Patient did not wear her CPAP last night.  Complains of diffuse abdominal pain.  Urinalysis consistent with infection.  Start antibiotics. 2/22: Mental status improved.  Abdominal pain improved.  Foley discontinued yesterday.  Patient able to void.  Did not wear CPAP again overnight.  Continues to endorse  severe nausea. 2/23: Patient remains with nausea and diarrhea.  Very deconditioned.  Abdominal CT demonstrates diffuse body wall anasarca. 2/24: Patient continues to endorse severe nausea and diarrhea 2/25: Home Percocet restarted at low-dose.  Patient experiencing some improvement in  symptoms 2/26: Patient started to improve.  Nausea resolved.  Tolerating p.o. intake.  Diarrhea resolved   Assessment & Plan:   Principal Problem:   Acute metabolic encephalopathy Active Problems:   Cellulitis of right foot   Severe sepsis ([name])   GERD (gastroesophageal reflux disease)   Depression   Essential hypertension   AKI (acute kidney injury) ([place])   AMS (altered mental status)   Septic shock ([name])   Abscess of right foot   Morbid obesity ([place])   Right foot infection  Acute metabolic encephalopathy Unclear etiology.  Mental status waxing waning.  Suspect element of polypharmacy.  Interestingly repeat urinalysis on 2/21 demonstrate signs of infection.  Patient did also not wear her CPAP last night.  Per RN she refused.  Mental status appears intact a.m. 2/21.   EEG unrevealing CT head unrevealing ABG unrevealing Plan:  Restart oxycodone at reduced dose, scheduled 5 mg every 6 hours Lyrica restarted at reduced dose, increase to 75 twice daily Per ID no indication for UTI treatment, suspect chronic pyuria Ensure patient wears CPAP nightly   Septic shock - POA - resolved  Right foot cellulitis and abscess at site of prior TMA with history of prior ulceration (+)MRSA S/p return to the OR on 05/27/2022 for repeat washout, revisional amputation and resection of necrotic bone and soft tissue and primary closure of the amputation site. Surgical path no osteomyelitis   Wound culture yielded MRSA.  Patient has been on IV vancomycin and last dose was on 05/29/22.  Vancomycin was discontinued due to AKI Started on daptomycin on 06/02/22 last dose was on 2/18.    AKI with hyperkalemia and hyponatremia Sharp increase in Cr.  AKI is thought to be multifactorial and related to IV contrast and possible hypotension during surgical procedure as well as poor oral intake. Serum creatinine peaked-3.5 and shows a downward trend 3.5 >> 1.08 >> 1.11.  Patient's baseline is about 0.56 DC  IVF Avoid nephrotoxic agents   Depression anxiety bipolar disorder history of seizure disorder cont Buspar and Lexapro  cont lamotrigine and Topamax   Chronic pain syndrome recent right shoulder arthroplasty Oxycodone restarted at low-dose.  Lyrica restarted at reduced dose   Morbid obesity, BMI 99991111 Complicates treatment and prognosis   Sleep apnea CPAP nightly   Anemia (acute on chronic) Patient with baseline hemoglobin of 10 which has dropped to 7.4 No obvious source of bleeding at this time Aspirin and Lovenox have been on hold Will transfuse for hemoglobin less than 7 Serum iron is within normal limits   Nausea/Vomiting. ??  Diabetic gastroparesis Diarrhea Patient not on Reglan due to history of tardive dyskinesia No improvement with Reglan Patient states the only thing that works for her is Phenergan.  Compazine discontinued.  Phenergan initiated.  Dose was increased to 25 mg every 4 hours As needed Ativan for refractory symptoms Added twice daily Questran for diarrhea Added psyllium for stool bulking effect Added Percocet for possible opioid withdrawal diarrhea, improving     DVT prophylaxis: SCD Code Status: Full Family Communication: Family member at bedside 2/25 Disposition Plan: Status is: Inpatient Remains inpatient appropriate because: Intractable nausea and vomiting.  Resolving.  Anticipate discharge 2/27.  Level of care: Med-Surg  Consultants:  Psychiatry Neurology  Procedures:  None  Antimicrobials: None   Subjective: Seen and examined.  Appears deconditioned.  Nausea and diarrhea improved.  Tolerating p.o. Objective: Vitals:   06/13/22 1609 06/13/22 2011 06/14/22 0007 06/14/22 0750  BP: (!) 141/71  (!) 169/93 130/67  Pulse: 77  73 64  Resp: '17  16 17  '$ Temp: 98 F (36.7 C)  98.7 F (37.1 C) 98 F (36.7 C)  TempSrc:      SpO2: 100% 99% 99% 98%  Weight:      Height:       No intake or output data in the 24 hours ending 06/14/22  1508  Filed Weights   05/22/22 1221 05/24/22 1637 05/27/22 1457  Weight: 108.1 kg 108 kg 104.3 kg    Examination:  General exam: NAD.  Sitting up in chair.  Appears fatigued Respiratory system: Clear to auscultation. Respiratory effort normal. Cardiovascular system: S1-S2, RRR, no murmurs, no pedal edema Gastrointestinal system: Obese, soft, NT/ND, normal bowel sounds Central nervous system: Awake, fatigued, oriented x 3, no focal deficits Extremities: Symmetric 5 x 5 power. Skin: No rashes, lesions or ulcers Psychiatry: Judgement and insight appear impaired. Mood & affect flattened.     Data Reviewed: I have personally reviewed following labs and imaging studies  CBC: Recent Labs  Lab 06/08/22 1638  HGB 7.9*  HCT 99991111*   Basic Metabolic Panel: Recent Labs  Lab 06/07/22 1648 06/08/22 0605  NA 137 137  K 4.3 4.0  CL 110 110  CO2 22 22  GLUCOSE 131* 107*  BUN 39* 37*  CREATININE 1.07* 1.11*  CALCIUM 7.9* 7.8*   GFR: Estimated Creatinine Clearance: 65 mL/min (A) (by C-G formula based on SCr of 1.11 mg/dL (H)). Liver Function Tests: No results for input(s): "AST", "ALT", "ALKPHOS", "BILITOT", "PROT", "ALBUMIN" in the last 168 hours.  No results for input(s): "LIPASE", "AMYLASE" in the last 168 hours. Recent Labs  Lab 06/07/22 1651  AMMONIA 26   Coagulation Profile: No results for input(s): "INR", "PROTIME" in the last 168 hours. Cardiac Enzymes: Recent Labs  Lab 06/08/22 0605  CKTOTAL 13*   BNP (last 3 results) No results for input(s): "PROBNP" in the last 8760 hours. HbA1C: No results for input(s): "HGBA1C" in the last 72 hours. CBG: Recent Labs  Lab 06/13/22 2004 06/14/22 0008 06/14/22 0350 06/14/22 0811 06/14/22 1248  GLUCAP 102* 111* 84 98 121*   Lipid Profile: No results for input(s): "CHOL", "HDL", "LDLCALC", "TRIG", "CHOLHDL", "LDLDIRECT" in the last 72 hours. Thyroid Function Tests: No results for input(s): "TSH", "T4TOTAL", "FREET4",  "T3FREE", "THYROIDAB" in the last 72 hours.  Anemia Panel: No results for input(s): "VITAMINB12", "FOLATE", "FERRITIN", "TIBC", "IRON", "RETICCTPCT" in the last 72 hours. Sepsis Labs: No results for input(s): "PROCALCITON", "LATICACIDVEN" in the last 168 hours.  Recent Results (from the past 240 hour(s))  Gastrointestinal Panel by PCR , Stool     Status: None   Collection Time: 06/10/22  7:30 AM   Specimen: Stool  Result Value Ref Range Status   Campylobacter species NOT DETECTED NOT DETECTED Final   Plesimonas shigelloides NOT DETECTED NOT DETECTED Final   Salmonella species NOT DETECTED NOT DETECTED Final   Yersinia enterocolitica NOT DETECTED NOT DETECTED Final   Vibrio species NOT DETECTED NOT DETECTED Final   Vibrio cholerae NOT DETECTED NOT DETECTED Final   Enteroaggregative E coli (EAEC) NOT DETECTED NOT DETECTED Final   Enteropathogenic E coli (EPEC) NOT DETECTED NOT DETECTED Final   Enterotoxigenic E coli (ETEC) NOT DETECTED  NOT DETECTED Final   Shiga like toxin producing E coli (STEC) NOT DETECTED NOT DETECTED Final   Shigella/Enteroinvasive E coli (EIEC) NOT DETECTED NOT DETECTED Final   Cryptosporidium NOT DETECTED NOT DETECTED Final   Cyclospora cayetanensis NOT DETECTED NOT DETECTED Final   Entamoeba histolytica NOT DETECTED NOT DETECTED Final   Giardia lamblia NOT DETECTED NOT DETECTED Final   Adenovirus F40/41 NOT DETECTED NOT DETECTED Final   Astrovirus NOT DETECTED NOT DETECTED Final   Norovirus GI/GII NOT DETECTED NOT DETECTED Final   Rotavirus A NOT DETECTED NOT DETECTED Final   Sapovirus (I, II, IV, and V) NOT DETECTED NOT DETECTED Final    Comment: Performed at [facility], [address] [place], [place] [address]         Radiology Studies: No results found.      Scheduled Meds:  busPIRone  15 mg Oral TID   Chlorhexidine Gluconate Cloth  6 each Topical Q0600   Chlorhexidine Gluconate Cloth  6 each Topical Q0600   DULoxetine  20 mg  Oral Daily   escitalopram  10 mg Oral Daily   estradiol  1 mg Oral QHS   feeding supplement  237 mL Oral TID BM   lamoTRIgine  100 mg Oral Daily   multivitamin with minerals  1 tablet Oral Daily   oxyCODONE  5 mg Oral Q6H   pantoprazole  40 mg Oral BID   pregabalin  75 mg Oral BID   psyllium  1 packet Oral Daily   sodium chloride flush  10-40 mL Intracatheter Q12H   sodium chloride flush  3 mL Intravenous Q12H   topiramate  50 mg Oral BID   Continuous Infusions:  sodium chloride Stopped (05/24/22 1920)   promethazine (PHENERGAN) injection (IM or IVPB) 25 mg (06/12/22 1002)     LOS: 23 days     Sidney Ace, MD [facility]   If 7PM-7AM, please contact night-coverage  06/14/2022, 3:08 PM

## 2022-06-14 NOTE — Care Management Important Message (Signed)
Important Message  Patient Details  Name: [name] [name] MRN: [id number] Date of Birth: 1965/04/25   Medicare Important Message Given:  Other (see comment)  Patient is in an isolation room so I called her room [phone]) to review the Important Message from Medicare with her again but there was no answer. Will try again.   [name] 06/14/2022, 2:10 PM

## 2022-06-15 DIAGNOSIS — G9341 Metabolic encephalopathy: Secondary | ICD-10-CM | POA: Diagnosis not present

## 2022-06-15 LAB — GLUCOSE, CAPILLARY
Glucose-Capillary: 101 mg/dL — ABNORMAL HIGH (ref 70–99)
Glucose-Capillary: 93 mg/dL (ref 70–99)

## 2022-06-15 MED ORDER — DULOXETINE HCL 20 MG PO CPEP: 20.0000 mg | ORAL_CAPSULE | Freq: Every day | ORAL | 0 refills | Status: DC

## 2022-06-15 MED ORDER — LORAZEPAM 1 MG PO TABS: 1.0000 mg | ORAL_TABLET | Freq: Four times a day (QID) | ORAL | 0 refills | Status: AC | PRN

## 2022-06-15 MED ORDER — PROMETHAZINE HCL 12.5 MG PO TABS: 12.5000 mg | ORAL_TABLET | Freq: Four times a day (QID) | ORAL | 0 refills | Status: DC | PRN

## 2022-06-15 NOTE — Progress Notes (Signed)
Physical Therapy Treatment Patient Details Name: [name] [name] MRN: [id number] DOB: 10/21/1965 Today's Date: 06/15/2022   History of Present Illness 57 year old female with history of bipolar disorder, total arthroplasty right shoulder April 14, 2022, chronic pain syndrome, anxiety depression, recent right toe amputation who presented to emergency room with altered mental status and redness of the right foot amputation.    PT Comments    Pt resting in bed upon PT arrival; agreeable to therapy (practicing slide-board transfer); pt reporting plan to discharge home today with [facility] therapy.  During session pt SBA supine to sitting edge of bed; min assist to place/remove slide-board but SBA for slide-board transfer otherwise (bed to recliner).  Pt reporting no questions/concerns for performing slide-board transfers at home (pt reports she will have her mother or adult nephew assist her with transfers).  TOC (and pt's nurse) notified of pt's need for slide-board for discharge today.  Pt reports having manual w/c at home already.  New orders noted for WBAT R with cam walker (boot) but pt does not have cam walker yet (nursing working on it); pt reports she doesn't want to put weight through R foot yet anyway but will work on it with therapy at home.   Recommendations for follow up therapy are one component of a multi-disciplinary discharge planning process, led by the attending physician.  Recommendations may be updated based on patient status, additional functional criteria and insurance authorization.  Follow Up Recommendations  Follow physician's recommendations for discharge plan and follow up therapies Can patient physically be transported by private vehicle: Yes   Assistance Recommended at Discharge Frequent or constant Supervision/Assistance  Patient can return home with the following A little help with walking and/or transfers;A little help with bathing/dressing/bathroom;Assistance with  cooking/housework;Direct supervision/assist for medications management;Direct supervision/assist for financial management;Assist for transportation;Help with stairs or ramp for entrance   Equipment Recommendations  Other (comment) (slideboard (pt has RW and manual w/c at home); pt declining hospital bed (pt sleeps in recliner at home))    Recommendations for Other Services       Precautions / Restrictions Precautions Precautions: Fall Restrictions Weight Bearing Restrictions: Yes RUE Weight Bearing: Weight bearing as tolerated RLE Weight Bearing: Non weight bearing     Mobility  Bed Mobility Overal bed mobility: Needs Assistance Bed Mobility: Supine to Sit     Supine to sit: Supervision, HOB elevated     General bed mobility comments: increased effort to perform on own    Transfers Overall transfer level: Needs assistance Equipment used: None Transfers: Bed to chair/wheelchair/BSC            Lateral/Scoot Transfers: Min assist General transfer comment: assist for slideboard placement and removal (SBA for slideboard transfer otherwise); initial vc's for head/hips relationship and positioning/technique    Ambulation/Gait               General Gait Details: pt declined further activity d/t fatigue   Stairs             Wheelchair Mobility    Modified Rankin (Stroke Patients Only)       Balance Overall balance assessment: Needs assistance Sitting-balance support: No upper extremity supported Sitting balance-Leahy Scale: Good Sitting balance - Comments: steady sitting reaching within BOS                                    Cognition Arousal/Alertness: Awake/alert Behavior During Therapy: Flat  affect Overall Cognitive Status: Within Functional Limits for tasks assessed                                          Exercises General Exercises - Lower Extremity Long Arc Quad: AROM, Strengthening, Both, 10 reps,  Seated Hip Flexion/Marching: AROM, Strengthening, Both, 10 reps, Seated    General Comments  Nursing cleared pt for participation in physical therapy.  Pt agreeable to PT session.      Pertinent Vitals/Pain Pain Assessment Pain Assessment: Faces Faces Pain Scale: Hurts little more Pain Location: chronic back pain Pain Descriptors / Indicators: Aching, Sore Pain Intervention(s): Limited activity within patient's tolerance, Monitored during session, Repositioned    Home Living                          Prior Function            PT Goals (current goals can now be found in the care plan section) Acute Rehab PT Goals Patient Stated Goal: go home when I feel better PT Goal Formulation: With patient Time For Goal Achievement: 06/24/22 Potential to Achieve Goals: Fair Additional Goals Additional Goal #1: Pt able to propel manual w/c x150 feet modified independently for safe mobility within home. Progress towards PT goals: Progressing toward goals    Frequency    Min 2X/week      PT Plan Current plan remains appropriate    Co-evaluation              AM-PAC PT "6 Clicks" Mobility   Outcome Measure  Help needed turning from your back to your side while in a flat bed without using bedrails?: None Help needed moving from lying on your back to sitting on the side of a flat bed without using bedrails?: None Help needed moving to and from a bed to a chair (including a wheelchair)?: A Little Help needed standing up from a chair using your arms (e.g., wheelchair or bedside chair)?: A Little Help needed to walk in hospital room?: Total Help needed climbing 3-5 steps with a railing? : Total 6 Click Score: 16    End of Session   Activity Tolerance: Patient limited by fatigue Patient left: in chair;with call bell/phone within reach;with chair alarm set;Other (comment) (R LE elevated on 2 pillows with heel floating) Nurse Communication: Mobility  status;Precautions;Patient requests pain meds PT Visit Diagnosis: Muscle weakness (generalized) (M62.81);Difficulty in walking, not elsewhere classified (R26.2)     Time: [id number] PT Time Calculation (min) (ACUTE ONLY): 28 min  Charges:  $Therapeutic Activity: 23-37 mins                     Leitha Bleak, PT 06/15/22, 12:23 PM

## 2022-06-15 NOTE — Care Management Important Message (Signed)
Important Message  Patient Details  Name: [name] [name] MRN: [id number] Date of Birth: [birth year]   Medicare Important Message Given:  Yes     [name] 06/15/2022, 9:03 AM

## 2022-06-15 NOTE — Discharge Summary (Signed)
Physician Discharge Summary  [name] [name] [id number] DOB: Jun 22, 1965 DOA: 05/21/2022  PCP: [name], MD  Admit date: 05/21/2022 Discharge date: 06/15/2022  Admitted From: Home Disposition:  Home with homehealth  Recommendations for Outpatient Follow-up:  Follow up with PCP in 1-2 weeks Follow-up outpatient podiatry Follow-up outpatient psychiatry  Home Health: Yes PT OT RN aide Equipment/Devices: None  Discharge Condition: Stable CODE STATUS: Full Diet recommendation: Heart healthy  Brief/Interim Summary: 57 year old female with history of bipolar disorder, total arthroplasty right shoulder December 2023, chronic pain syndrome, anxiety depression, who presented to emergency room 05/21/22 with altered mental status and redness of the right foot amputation stump, was on Keflex at home, TMA 2 mos prior.  02/02: to ED as above. Septic shock, required brief levo gtt. 02/03: admitted to hospitalist service, underwent I&D w/ podiatry 02/04: stable on IV abx awaiting Cx 02/05: back to OR for revision I&D.   02/06-02/07: stable, planning back to OR 02/08. Cultures from 02/03 (+)MRSA 02/08: to OR for revision of RLE TMA. Per ID, continue vanco and send bone for pathology and culture  02/09: podiatry and ID recs for continue inpatient for now to ensure surgical stabilization and await culture results to guide abx, will likely be here thru the weekend (today is Friday)   02/10-02/11: stable, awaiting cultures 02/12: Surgical pathology resulted: no osteomyelitis. Cr jumped up today, pt reports no urine yesterday. IV fluids. UA no infection, FeNa prerenal. ID and pharmacy following and adjusting abx and other possible nephrotoxic medications. 600 mL UOP today, Cr worsening this afternoon, will give additional 1L fluids, nephrology consulted.  02/13: minimal improvement Cr, worsening hyponatremia, nephrology recs pending. 02/14: Continues to complain about inability to void.  Bladder  scan done at noon had 541 cc of urine.  Serum creatinine peaked at 3.56 and today it is 2.75.  Will place Foley catheter 2/15  : Patient seen and examined at bedside lethargic in bed.  Continues to stay on daptomycin pending ID recommendations for further continuation of medication.  2/16 : Patient seen and examined this morning complaining of not feeling well.  No acute overnight events.  Vital stable.  Labs with persistent hyponatremia with sodium of 125.  Renal function with improvement creatinine 1.7.  Patient continued on daptomycin with last dose on 2/18 as per infectious disease Patient was seen and examined this morning.  No overnight events.  Was lethargic at the time of my evaluation.  Hyponatremia improving. Patient pending PICC line placement for frequent blood draws.holding Lyrica as pt has been lethargic and somnolent.  Patient seen and examined this morning.  Still lethargic no overnight events reported.  Patient had PICC line placed for frequent blood draws.  Patient will have last dose of daptomycin today on 2/18 as per ID.  PT needs to evaluate the patient for safe discharge.  Patient is from home initial plan was to return to home however because of deconditioning would need higher level of care at the time of discharge. 2/19: Patient is seen and examined during rounds and remains very lethargic.  Arouses to name call.  No oral intake.  Blood sugar at bedside 106.  Lyrica has been on hold and will also place Cymbalta on hold as well. 2/20: Patient is seen and examined during rounds.  She is more awake this morning and complains of abdominal pain mostly in the periumbilical area and nausea.  Last bowel movement was 02/19 2/21: Mental status improved this morning.  Suspected due to  polypharmacy.  Patient did not wear her CPAP last night.  Complains of diffuse abdominal pain.  Urinalysis consistent with infection.  Start antibiotics. 2/22: Mental status improved.  Abdominal pain improved.   Foley discontinued yesterday.  Patient able to void.  Did not wear CPAP again overnight.  Continues to endorse severe nausea. 2/23: Patient remains with nausea and diarrhea.  Very deconditioned.  Abdominal CT demonstrates diffuse body wall anasarca. 2/24: Patient continues to endorse severe nausea and diarrhea 2/25: Home Percocet restarted at low-dose.  Patient experiencing some improvement in symptoms 2/26: Patient started to improve.  Nausea resolved.  Tolerating p.o. intake.  Diarrhea resolved 2/27: Patient back to baseline level of mentation.  Nausea resolved.  Tolerating p.o. intake.  Diarrhea resolved.  Stable for DC home    Discharge Diagnoses:  Principal Problem:   Acute metabolic encephalopathy Active Problems:   Cellulitis of right foot   Severe sepsis ([name])   GERD (gastroesophageal reflux disease)   Depression   Essential hypertension   AKI (acute kidney injury) ([place])   AMS (altered mental status)   Septic shock ([name])   Abscess of right foot   Morbid obesity ([place])   Right foot infection  Acute metabolic encephalopathy Unclear etiology.  Mental status waxing waning.  Suspect element of polypharmacy.  Interestingly repeat urinalysis on 2/21 demonstrate signs of infection.  Patient did also not wear her CPAP last night.  Per RN she refused.  Mental status appears intact a.m. 2/21.   EEG unrevealing CT head unrevealing ABG unrevealing Plan:  Long conversation with patient prior to discharge.  High likelihood that metabolic encephalopathy was secondary to medication burden.  Recommend reduced dose of oxycodone and Lyrica.  Patient will need follow-up with sleep center for repeat polysomnography and restarting home CPAP therapy.  Septic shock - POA - resolved  Right foot cellulitis and abscess at site of prior TMA with history of prior ulceration (+)MRSA S/p return to the OR on 05/27/2022 for repeat washout, revisional amputation and resection of necrotic bone and soft tissue  and primary closure of the amputation site. Surgical path no osteomyelitis   Wound culture yielded MRSA.  Patient has been on IV vancomycin and last dose was on 05/29/22.  Vancomycin was discontinued due to AKI Started on daptomycin on 06/02/22 last dose was on 2/18.    AKI with hyperkalemia and hyponatremia Sharp increase in Cr.  AKI is thought to be multifactorial and related to IV contrast and possible hypotension during surgical procedure as well as poor oral intake. Serum creatinine peaked-3.5 and shows a downward trend 3.5 >> 1.08 >> 1.11.  Patient's baseline is about 0.56 Repeat lab work in 1 to 2 weeks with PCP   Depression anxiety bipolar disorder history of seizure disorder cont Buspar and Lexapro  cont lamotrigine and Topamax   Chronic pain syndrome recent right shoulder arthroplasty Oxycodone restarted at low-dose.  Lyrica restarted at reduced dose   Morbid obesity, BMI [id number] Complicates treatment and prognosis   Sleep apnea CPAP nightly Will need outpatient follow-up with pulmonology for repeat polysomnography   Anemia (acute on chronic) Patient with baseline hemoglobin of 10 which has dropped to 7.4 No obvious source of bleeding at this time Aspirin and Lovenox have been on hold Stable at time of discharge    Nausea/Vomiting. ??  Diabetic gastroparesis Diarrhea The nausea and vomiting was in the setting of opioid withdrawal.  Patient symptoms improved substantially once opioids were restarted at reduced dose.  Patient tolerating p.o.  without nausea vomiting or abdominal pain at time of DC.  Discharge Instructions  Discharge Instructions     Diet - low sodium heart healthy   Complete by: As directed    Increase activity slowly   Complete by: As directed       Allergies as of 06/15/2022       Reactions   Other    Tetracyclines & Related Other (See Comments)   Syncope and put her "in a coma"   Tramadol Other (See Comments)   Seizures    Phenazopyridine Other (See Comments)   Unknown   Tetracycline Other (See Comments)   Syncope and "Put me in a coma"   Ciprofloxacin Rash, Itching   Codeine Itching, Rash   Estradiol Rash   Patches broke out the skin        Medication List     STOP taking these medications    aspirin 81 MG chewable tablet Commonly known as: Aspirin Childrens   Bactrim DS 800-160 MG tablet Generic drug: sulfamethoxazole-trimethoprim   cyclobenzaprine 10 MG tablet Commonly known as: FLEXERIL   Linzess 290 MCG Caps capsule Generic drug: linaclotide   methocarbamol 750 MG tablet Commonly known as: Robaxin-750   metoprolol succinate 25 MG 24 hr tablet Commonly known as: TOPROL-XL   Movantik 25 MG Tabs tablet Generic drug: naloxegol oxalate   prazosin 2 MG capsule Commonly known as: MINIPRESS   SUMAtriptan 50 MG tablet Commonly known as: IMITREX       TAKE these medications    acetaminophen 500 MG tablet Commonly known as: TYLENOL Take 1 tablet (500 mg total) by mouth every 8 (eight) hours as needed for moderate pain.   Aimovig 70 MG/ML Soaj Generic drug: Erenumab-aooe administer 26m UNDER THE SKIN every 30 DAYS   albuterol 108 (90 Base) MCG/ACT inhaler Commonly known as: ProAir HFA Inhale 2 puffs = 1866m into the lungs every 6 (six) hours as needed for wheezing or shortness of breath.   busPIRone 15 MG tablet Commonly known as: BUSPAR Take 15 mg by mouth 3 (three) times daily.   CALCIUM 1200 PO Take 1,200 mg by mouth daily.   DULoxetine 20 MG capsule Commonly known as: CYMBALTA Take 1 capsule (20 mg total) by mouth daily. What changed:  medication strength how much to take how to take this when to take this additional instructions   escitalopram 10 MG tablet Commonly known as: LEXAPRO Take 10 mg by mouth daily.   estradiol 1 MG tablet Commonly known as: ESTRACE Take 1 tablet (1 mg total) by mouth at bedtime.   famotidine 40 MG tablet Commonly known as:  PEPCID TAKE ONE TABLET BY MOUTH EVERYDAY AT BEDTIME   haloperidol 5 MG tablet Commonly known as: HALDOL Take 1 tablet (5 mg total) by mouth at bedtime. For psychosis   lamoTRIgine 100 MG tablet Commonly known as: LAMICTAL Take 100 mg by mouth daily.   levocetirizine 5 MG tablet Commonly known as: XYZAL TAKE ONE TABLET BY MOUTH EVERY EVENING   lidocaine 5 % Commonly known as: LIDODERM Place 1 patch onto the skin daily as needed (pain). Remove & Discard patch within 12 hours or as directed by MD   LORazepam 1 MG tablet Commonly known as: ATIVAN Take 1 tablet (1 mg total) by mouth every 6 (six) hours as needed for up to 5 days (intractable nausea).   meloxicam 15 MG tablet Commonly known as: MOBIC Take 15 mg by mouth daily.   multivitamin with minerals tablet  Take 1 tablet by mouth daily.   naloxone 4 MG/0.1ML Liqd nasal spray kit Commonly known as: NARCAN Place 0.4 mg into the nose once.   oxyCODONE-acetaminophen 10-325 MG tablet Commonly known as: PERCOCET Take 1 tablet by mouth 4 (four) times daily as needed for pain.   pantoprazole 40 MG tablet Commonly known as: PROTONIX Take 1 tablet (40 mg total) by mouth 2 (two) times daily.   pregabalin 100 MG capsule Commonly known as: LYRICA Take 1 capsule (100 mg total) by mouth 2 (two) times daily. What changed: when to take this   promethazine 12.5 MG tablet Commonly known as: PHENERGAN Take 1 tablet (12.5 mg total) by mouth every 6 (six) hours as needed for nausea or vomiting. What changed:  medication strength how much to take when to take this   Relistor 150 MG Tabs Generic drug: Methylnaltrexone Bromide Take 450 mg by mouth every morning.   solifenacin 10 MG tablet Commonly known as: VESICARE TAKE ONE TABLET BY MOUTH DAILY   spironolactone 50 MG tablet Commonly known as: ALDACTONE TAKE ONE TABLET BY MOUTH ONCE DAILY   topiramate 50 MG tablet Commonly known as: TOPAMAX TAKE ONE TABLET BY MOUTH TWICE  DAILY   traZODone 100 MG tablet Commonly known as: DESYREL Take 200 mg by mouth at bedtime as needed for sleep.               Durable Medical Equipment  (From admission, onward)           Start     Ordered   06/15/22 1152  For home use only DME Other see comment  Once       Comments: Slide-board  Question:  Length of Need  Answer:  6 Months   06/15/22 1152            Follow-up Information     [name], DPM. Schedule an appointment as soon as possible for a visit in 1 week(s).   Specialty: Podiatry Why: For suture removal Contact information: [place] [address] [phone]         [name], MD. Schedule an appointment as soon as possible for a visit in 1 week(s).   Specialty: Family Medicine Contact information: [id number] [place] [address] [phone]                Allergies  Allergen Reactions   Other    Tetracyclines & Related Other (See Comments)    Syncope and put her "in a coma"   Tramadol Other (See Comments)    Seizures   Phenazopyridine Other (See Comments)    Unknown   Tetracycline Other (See Comments)    Syncope and "Put me in a coma"   Ciprofloxacin Rash and Itching   Codeine Itching and Rash   Estradiol Rash    Patches broke out the skin    Consultations: Psychiatry Podiatry   Procedures/Studies: CT ABDOMEN PELVIS WO CONTRAST  Result Date: 06/11/2022 CLINICAL DATA:  Severe abdominal pain and nausea for several days. Urinary tract infection. EXAM: CT ABDOMEN AND PELVIS WITHOUT CONTRAST TECHNIQUE: Multidetector CT imaging of the abdomen and pelvis was performed following the standard protocol without IV contrast. RADIATION DOSE REDUCTION: This exam was performed according to the departmental dose-optimization program which includes automated exposure control, adjustment of the mA and/or kV according to patient size and/or use of iterative reconstruction technique.  COMPARISON:  06/30/2021 FINDINGS: Lower chest: New small to moderate  bilateral pleural effusions, with bilateral lower lobe compressive atelectasis. Hepatobiliary: No mass visualized on this unenhanced exam. Gallbladder is unremarkable. No evidence of biliary ductal dilatation. Mild perihepatic ascites is new since prior study. Pancreas: No mass or inflammatory process visualized on this unenhanced exam. Spleen:  Within normal limits in size. Adrenals/Urinary tract: No evidence of urolithiasis or hydronephrosis. Unremarkable unopacified urinary bladder. Stomach/Bowel: No evidence of obstruction, inflammatory process, or abnormal fluid collections. Diffuse mesenteric and body wall edema, new since prior exam. Vascular/Lymphatic: No pathologically enlarged lymph nodes identified. No evidence of abdominal aortic aneurysm. Reproductive: Prior hysterectomy noted. Adnexal regions are unremarkable in appearance. Other:  None. Musculoskeletal: No suspicious bone lesions identified. Posterior spinal fixation rods again seen in the thoracolumbar spine. IMPRESSION: New perihepatic ascites, diffuse mesenteric and body wall edema, and bilateral pleural effusions, consistent with anasarca. No evidence of focal inflammatory process, abscess, or soft tissue mass. Electronically Signed   By: Marlaine Hind M.D.   On: 06/11/2022 13:42   MR BRAIN WO CONTRAST  Result Date: 06/08/2022 CLINICAL DATA:  [place] EXAM: MRI HEAD WITHOUT CONTRAST TECHNIQUE: Multiplanar, multiecho pulse sequences of the brain and surrounding structures were obtained without intravenous contrast. COMPARISON:  CT head 06/07/2022, MRI Brain 06/09/21 FINDINGS: Brain: No acute infarction, hemorrhage, hydrocephalus, extra-axial collection or mass lesion. Vascular: Normal flow voids. Skull and upper cervical spine: Normal marrow signal. Sinuses/Orbits: Orbits are unremarkable. Paranasal sinuses are clear. No mastoid effusion. Other: None. IMPRESSION: No acute  abnormality. Electronically Signed   By: Marin Roberts M.D.   On: 06/08/2022 14:55   EEG adult  Result Date: 06/08/2022 Lora Havens, MD     06/08/2022  4:02 PM Patient Name: GERALINE KRIBS MRN: [id number] Epilepsy Attending: Lora Havens Referring Physician/Provider: Amie Portland, MD Date: 06/08/2022 Duration: 30.18 mins Patient history: 57yo F with ams. EEG to evaluate for seizure Level of alertness: Awake AEDs during EEG study: None Technical aspects: This EEG study was done with scalp electrodes positioned according to the 10-20 International system of electrode placement. Electrical activity was reviewed with band pass filter of 1-'70Hz'$ , sensitivity of 7 uV/mm, display speed of 58m/sec with a '60Hz'$  notched filter applied as appropriate. EEG data were recorded continuously and digitally stored.  Video monitoring was available and reviewed as appropriate. Description: EEG showed continuous generalized 3 to 6 Hz theta-delta slowing. Hyperventilation and photic stimulation were not performed.   Of note, study was technically difficult due to significant myogenic artifact. ABNORMALITY - Continuous slow, generalized IMPRESSION: This technically difficult study is suggestive of moderate to severe diffuse encephalopathy, nonspecific etiology. No seizures or epileptiform discharges were seen throughout the recording. [place]  [place] HEAD WO CONTRAST (5MM)  Result Date: 06/07/2022 CLINICAL DATA:  Neuro deficit, acute, stroke suspected. EXAM: CT HEAD WITHOUT CONTRAST TECHNIQUE: Contiguous axial images were obtained from the base of the skull through the vertex without intravenous contrast. RADIATION DOSE REDUCTION: This exam was performed according to the departmental dose-optimization program which includes automated exposure control, adjustment of the mA and/or kV according to patient size and/or use of iterative reconstruction technique. COMPARISON:  Head CT and MRI 06/09/2021 FINDINGS: Brain:  There is no evidence of an acute infarct, intracranial hemorrhage, mass, midline shift, or extra-axial fluid collection. The ventricles and sulci are normal. Vascular: No hyperdense vessel. Skull: No fracture or suspicious osseous lesion. Sinuses/Orbits: Visualized paranasal sinuses and mastoid air cells are clear. Unremarkable orbits. Other: None. IMPRESSION: Negative head CT. Electronically Signed   By:  Logan Bores M.D.   On: 06/07/2022 15:12   [place] EKG SITE RITE  Result Date: 06/05/2022 If Site Rite image not attached, placement could not be confirmed due to current cardiac rhythm.  US RENAL  Result Date: 06/02/2022 CLINICAL DATA:  Acute kidney failure, unspecified ([place]) EXAM: RENAL / URINARY TRACT ULTRASOUND COMPLETE COMPARISON:  CT 06/30/2021. FINDINGS: Right Kidney: Renal measurements: 12.8 x 5.2 x 6.3 cm = volume: 220.3 mL. Echogenicity within normal limits. No mass or hydronephrosis visualized. Left Kidney: Renal measurements: 11.9 x 6.4 x 6.5 cm = volume: 261.4 mL. Echogenicity within normal limits. No mass or hydronephrosis visualized. Bladder: Appears normal for degree of bladder distention. Other: None. IMPRESSION: No hydronephrosis. Electronically Signed   By: Maurine Simmering M.D.   On: 06/02/2022 12:22   DG Foot Complete Right  Result Date: 05/27/2022 CLINICAL DATA:  Postoperative check. Revision transmetatarsal amputation with delayed primary closure. EXAM: RIGHT FOOT COMPLETE - 3+ VIEW COMPARISON:  Right foot radiographs 05/22/2022 FINDINGS: There is a revision amputation of the first metatarsal to the proximal metaphysis. There appears to be no significant change in prior amputation to the proximal shafts of the second through fifth metatarsals. There are distal foot surgical skin staples. Expected postoperative subcutaneous air is seen. There is again mild high-grade chronic enthesopathic change at the plantar fascia origin. Mild chronic spurring at the Achilles tendon insertion. No cortical  erosion is visualized. IMPRESSION: Status post revision amputation of the first metatarsal to the proximal metaphysis. There is a sharp resection border. Electronically Signed   By: Yvonne Kendall M.D.   On: 05/27/2022 18:38   DG MINI C-ARM IMAGE ONLY  Result Date: 05/27/2022 There is no interpretation for this exam.  This order is for images obtained during a surgical procedure.  Please See "Surgeries" Tab for more information regarding the procedure.   CT FOOT RIGHT W CONTRAST  Result Date: 05/22/2022 CLINICAL DATA:  Foot infection EXAM: CT OF THE LOWER RIGHT EXTREMITY WITH CONTRAST TECHNIQUE: Multidetector CT imaging of the lower right extremity was performed according to the standard protocol following intravenous contrast administration. RADIATION DOSE REDUCTION: This exam was performed according to the departmental dose-optimization program which includes automated exposure control, adjustment of the mA and/or kV according to patient size and/or use of iterative reconstruction technique. CONTRAST:  123m OMNIPAQUE IOHEXOL 300 MG/ML  SOLN COMPARISON:  05/22/2022 radiographs FINDINGS: Bones/Joint/Cartilage Prior transmetatarsal amputation. No bony destructive findings, substantial erosions, or periosteal reaction to indicate active osteomyelitis. Plantar and Achilles calcaneal spurs with the plantar calcaneal spur chronically fragmented. Ligaments Suboptimally assessed by CT. Muscles and Tendons Thickening of the plantar fascia likely from chronic fasciitis. Soft tissues Hypodensity with marginal enhancement along the distal stump region medially, measuring about 4.8 by 2.4 by 4.3 cm (volume = 26 cm^3), favoring abscess. This also has some finger-like projections and margins abutting the distal amputated margins of the first and second metatarsals. Subcutaneous edema dorsally along the ankle and circumferentially along the distal stump region. IMPRESSION: 1. Approximally 26 cc fluid collection with marginal  enhancement along the medial distal stump region, favoring abscess. This abuts the anterior margins of the amputated first and second metatarsals but no bony destructive findings are identified to specifically indicate osteomyelitis. 2. Thickening of the plantar fascia likely from chronic fasciitis. 3. Plantar and Achilles calcaneal spurs. 4. Subcutaneous edema dorsally along the ankle and circumferentially along the distal stump region. Electronically Signed   By: WVan ClinesM.D.   On: 05/22/2022 13:59  DG Foot Complete Right  Result Date: 05/22/2022 CLINICAL DATA:  [id number] Sepsis [facility]) [id number] EXAM: RIGHT FOOT COMPLETE - 3+ VIEW COMPARISON:  None Available. FINDINGS: All 5 ray amputation. No cortical erosion or destruction. There is no evidence of fracture or dislocation. Plantar and posterior calcaneal spur. There is no evidence of arthropathy or other focal bone abnormality. Subcutaneus soft tissue edema. IMPRESSION: All 5 ray amputation with no radiographic findings to suggest osteomyelitis. Limited evaluation due to overlapping osseous structures and overlying soft tissues. If high clinical concern, please consider MRI for further evaluation (with intravenous contrast if GFR greater than 30). Electronically Signed   By: Iven Finn M.D.   On: 05/22/2022 00:37   DG Chest Port 1 View  Result Date: 05/22/2022 CLINICAL DATA:  Question of sepsis.  Evaluate for abnormality. EXAM: PORTABLE CHEST 1 VIEW COMPARISON:  03/15/2022 FINDINGS: Shallow inspiration. Heart size and pulmonary vascularity are normal. Lungs are clear. No pleural effusions. No pneumothorax. Mediastinal contours appear intact. Postoperative changes with fixation of the thoracolumbar spine. Postoperative changes also in the cervical spine and right shoulder. IMPRESSION: No active disease. Electronically Signed   By: Lucienne Capers M.D.   On: 05/22/2022 00:35      Subjective: Seen and examined on the day of discharge.  Stable  no distress.  Appropriate for discharge home with home health services.  Discharge Exam: Vitals:   06/15/22 0003 06/15/22 0829  BP: (!) 161/81 (!) 153/87  Pulse: 69 64  Resp: 18 17  Temp: 98.4 F (36.9 C) 98 F (36.7 C)  SpO2: 100% 99%   Vitals:   06/14/22 0750 06/14/22 1635 06/15/22 0003 06/15/22 0829  BP: 130/67 (!) 148/75 (!) 161/81 (!) 153/87  Pulse: 64 68 69 64  Resp: '17 17 18 17  '$ Temp: 98 F (36.7 C) 98.7 F (37.1 C) 98.4 F (36.9 C) 98 F (36.7 C)  TempSrc:      SpO2: 98% 100% 100% 99%  Weight:      Height:        General: Pt is alert, awake, not in acute distress Cardiovascular: RRR, S1/S2 +, no rubs, no gallops Respiratory: CTA bilaterally, no wheezing, no rhonchi Abdominal: Soft, NT, ND, bowel sounds + Extremities: no edema, no cyanosis    The results of significant diagnostics from this hospitalization (including imaging, microbiology, ancillary and laboratory) are listed below for reference.     Microbiology: Recent Results (from the past 240 hour(s))  Gastrointestinal Panel by PCR , Stool     Status: None   Collection Time: 06/10/22  7:30 AM   Specimen: Stool  Result Value Ref Range Status   Campylobacter species NOT DETECTED NOT DETECTED Final   Plesimonas shigelloides NOT DETECTED NOT DETECTED Final   Salmonella species NOT DETECTED NOT DETECTED Final   Yersinia enterocolitica NOT DETECTED NOT DETECTED Final   Vibrio species NOT DETECTED NOT DETECTED Final   Vibrio cholerae NOT DETECTED NOT DETECTED Final   Enteroaggregative E coli (EAEC) NOT DETECTED NOT DETECTED Final   Enteropathogenic E coli (EPEC) NOT DETECTED NOT DETECTED Final   Enterotoxigenic E coli (ETEC) NOT DETECTED NOT DETECTED Final   Shiga like toxin producing E coli (STEC) NOT DETECTED NOT DETECTED Final   Shigella/Enteroinvasive E coli (EIEC) NOT DETECTED NOT DETECTED Final   Cryptosporidium NOT DETECTED NOT DETECTED Final   Cyclospora cayetanensis NOT DETECTED NOT DETECTED  Final   Entamoeba histolytica NOT DETECTED NOT DETECTED Final   Giardia lamblia NOT DETECTED NOT DETECTED Final  Adenovirus F40/41 NOT DETECTED NOT DETECTED Final   Astrovirus NOT DETECTED NOT DETECTED Final   Norovirus GI/GII NOT DETECTED NOT DETECTED Final   Rotavirus A NOT DETECTED NOT DETECTED Final   Sapovirus (I, II, IV, and V) NOT DETECTED NOT DETECTED Final    Comment: Performed at [facility], [address] [place], [place] [address]     Labs: BNP (last 3 results) No results for input(s): "BNP" in the last 8760 hours. Basic Metabolic Panel: No results for input(s): "NA", "K", "CL", "CO2", "GLUCOSE", "BUN", "CREATININE", "CALCIUM", "MG", "PHOS" in the last 168 hours. Liver Function Tests: No results for input(s): "AST", "ALT", "ALKPHOS", "BILITOT", "PROT", "ALBUMIN" in the last 168 hours. No results for input(s): "LIPASE", "AMYLASE" in the last 168 hours. No results for input(s): "AMMONIA" in the last 168 hours. CBC: Recent Labs  Lab 06/08/22 1638  HGB 7.9*  HCT 24.1*   Cardiac Enzymes: No results for input(s): "CKTOTAL", "CKMB", "CKMBINDEX", "TROPONINI" in the last 168 hours. BNP: Invalid input(s): "POCBNP" CBG: Recent Labs  Lab 06/14/22 1248 06/14/22 1610 06/14/22 1954 06/15/22 0433 06/15/22 0758  GLUCAP 121* 95 97 101* 93   D-Dimer No results for input(s): "DDIMER" in the last 72 hours. Hgb A1c No results for input(s): "HGBA1C" in the last 72 hours. Lipid Profile No results for input(s): "CHOL", "HDL", "LDLCALC", "TRIG", "CHOLHDL", "LDLDIRECT" in the last 72 hours. Thyroid function studies No results for input(s): "TSH", "T4TOTAL", "T3FREE", "THYROIDAB" in the last 72 hours.  Invalid input(s): "FREET3" Anemia work up No results for input(s): "VITAMINB12", "FOLATE", "FERRITIN", "TIBC", "IRON", "RETICCTPCT" in the last 72 hours. Urinalysis    Component Value Date/Time   COLORURINE AMBER (A) 06/09/2022 0957   APPEARANCEUR CLOUDY (A)  06/09/2022 0957   LABSPEC 1.017 06/09/2022 0957   PHURINE 6.0 06/09/2022 0957   GLUCOSEU NEGATIVE 06/09/2022 0957   HGBUR LARGE (A) 06/09/2022 0957   BILIRUBINUR NEGATIVE 06/09/2022 0957   KETONESUR NEGATIVE 06/09/2022 0957   PROTEINUR >=300 (A) 06/09/2022 0957   UROBILINOGEN 0.2 06/23/2017 1108   NITRITE POSITIVE (A) 06/09/2022 0957   LEUKOCYTESUR LARGE (A) 06/09/2022 0957   Sepsis Labs No results for input(s): "WBC" in the last 168 hours.  Invalid input(s): "PROCALCITONIN", "LACTICIDVEN" Microbiology Recent Results (from the past 240 hour(s))  Gastrointestinal Panel by PCR , Stool     Status: None   Collection Time: 06/10/22  7:30 AM   Specimen: Stool  Result Value Ref Range Status   Campylobacter species NOT DETECTED NOT DETECTED Final   Plesimonas shigelloides NOT DETECTED NOT DETECTED Final   Salmonella species NOT DETECTED NOT DETECTED Final   Yersinia enterocolitica NOT DETECTED NOT DETECTED Final   Vibrio species NOT DETECTED NOT DETECTED Final   Vibrio cholerae NOT DETECTED NOT DETECTED Final   Enteroaggregative E coli (EAEC) NOT DETECTED NOT DETECTED Final   Enteropathogenic E coli (EPEC) NOT DETECTED NOT DETECTED Final   Enterotoxigenic E coli (ETEC) NOT DETECTED NOT DETECTED Final   Shiga like toxin producing E coli (STEC) NOT DETECTED NOT DETECTED Final   Shigella/Enteroinvasive E coli (EIEC) NOT DETECTED NOT DETECTED Final   Cryptosporidium NOT DETECTED NOT DETECTED Final   Cyclospora cayetanensis NOT DETECTED NOT DETECTED Final   Entamoeba histolytica NOT DETECTED NOT DETECTED Final   Giardia lamblia NOT DETECTED NOT DETECTED Final   Adenovirus F40/41 NOT DETECTED NOT DETECTED Final   Astrovirus NOT DETECTED NOT DETECTED Final   Norovirus GI/GII NOT DETECTED NOT DETECTED Final   Rotavirus A NOT  DETECTED NOT DETECTED Final   Sapovirus (I, II, IV, and V) NOT DETECTED NOT DETECTED Final    Comment: Performed at [facility], [address]  [place], [place] [address]     Time coordinating discharge: Over 30 minutes  SIGNED:   Sidney Ace, MD  [facility] 06/15/2022, 12:23 PM Pager   If 7PM-7AM, please contact night-coverage

## 2022-06-15 NOTE — TOC Progression Note (Signed)
Transition of Care [facility]) - Progression Note    Patient Details  Name: [name] [name] MRN: [id number] Date of Birth: 07-03-1965  [facility] CM/SW Contact  Conception Oms, RN Phone Number: 06/15/2022, 10:56 AM  Clinical Narrative:     Cor with [facility] has accepted the patient for [facility] Pt and OT, homw with mother and son, np DME needed  Expected Discharge Plan: [place] Barriers to Discharge: Barriers Resolved  Expected Discharge Plan and Services   Discharge Planning Services: CM Consult Post Acute Care Choice: [place] arrangements for the past 2 months: Single Family Home Expected Discharge Date: 06/15/22               DME Arranged: N/A DME Agency: NA       HH Arranged: PT, OT HH Agency: [place] Date [facility] Agency Contacted: 06/15/22 Time [place]: [id number] Representative spoke with at [place]: [place] ([place]) Interventions SDOH Screenings   Food Insecurity: No Food Insecurity (05/22/2022)  Housing: Low Risk  (05/22/2022)  Transportation Needs: No Transportation Needs (05/22/2022)  Utilities: Not At Risk (05/22/2022)  Alcohol Screen: Low Risk  (08/21/2020)  Depression (PHQ2-9): Low Risk  (09/18/2021)  Financial Resource Strain: Low Risk  (08/21/2020)  Physical Activity: Inactive (08/21/2020)  Social Connections: Socially Isolated (08/21/2020)  Stress: No Stress Concern Present (08/21/2020)  Tobacco Use: Low Risk  (05/28/2022)    Readmission Risk Interventions     No data to display

## 2022-06-16 ENCOUNTER — Telehealth: Payer: Self-pay | Admitting: Pharmacist

## 2022-06-16 ENCOUNTER — Telehealth: Payer: Self-pay | Admitting: *Deleted

## 2022-06-16 ENCOUNTER — Encounter: Payer: Self-pay | Admitting: *Deleted

## 2022-06-16 NOTE — Transitions of Care (Post Inpatient/ED Visit) (Signed)
   06/16/2022  Name: [name] [name] MRN: [id number] DOB: 26-Mar-1966  Today's TOC FU Call Status: Today's TOC FU Call Status:: Successful TOC FU Call Competed TOC FU Call Complete Date: 06/16/22  Transition Care Management Follow-up Telephone Call Date of Discharge: 06/15/22 Discharge Facility: [facility]) Type of Discharge: Inpatient Admission Primary Inpatient Discharge Diagnosis:: Acute metabolic encephalopathy How have you been since you were released from the hospital?: Better Any questions or concerns?: No  Items Reviewed: Did you receive and understand the discharge instructions provided?: Yes Medications obtained and verified?: Yes (Medications Reviewed) Any new allergies since your discharge?: No Dietary orders reviewed?: Yes Type of Diet Ordered:: heart healthy Do you have support at home?: Yes People in Home: parent(s), other relative(s) Name of Support/Comfort Primary Source: Mom-[name] [name], and nephew  [place] and Equipment/Supplies: [place] Ordered?: Yes Has Agency set up a time to come to your home?: Yes [place] Visit Date: 06/20/22 Any new equipment or medical supplies ordered?: No  Functional Questionnaire: Do you need assistance with bathing/showering or dressing?: Yes Do you need assistance with meal preparation?: Yes Do you need assistance with eating?: No Do you have difficulty maintaining continence: No Do you need assistance with getting out of bed/getting out of a chair/moving?: No Do you have difficulty managing or taking your medications?: No  Folllow up appointments reviewed: PCP Follow-up appointment confirmed?: Yes Date of PCP follow-up appointment?: 06/25/22 Follow-up Provider: [facility] Follow-up appointment confirmed?: Yes Date of Specialist follow-up appointment?: 06/18/22 Follow-Up Specialty Provider:: Dr Amalia Hailey Do you need transportation to your follow-up  appointment?: No Do you understand care options if your condition(s) worsen?: Yes-patient verbalized understanding  SDOH Interventions Today    Flowsheet Row Most Recent Value  SDOH Interventions   Housing Interventions Intervention Not Indicated  Transportation Interventions Intervention Not Indicated      TOC Interventions Today    Flowsheet Row Most Recent Value  TOC Interventions   TOC Interventions Discussed/Reviewed Arranged PCP follow up less than 12 days/Care Guide scheduled, S/S of infection      Chong Sicilian, BSN, RN-BC RN [place]: [phone] Main #: [phone]

## 2022-06-16 NOTE — Progress Notes (Signed)
Care Management & Coordination Services Pharmacy Team  Reason for Encounter: Appointment Reminder  Contacted patient to confirm telephone appointment with Leata Mouse, PharmD on 06/17/2022 at 2:45 pm. Spoke with patient on 06/16/2022    Star Rating Drugs:  None   Care Gaps: Annual wellness visit in last year? No   Future Appointments  Date Time Provider [place]  06/17/2022  2:45 PM Edythe Clarity [place] None  06/18/2022 11:45 AM [name], DPM [facility]   April D Calhoun, [place] Pharmacist Assistant [phone]

## 2022-06-17 ENCOUNTER — Ambulatory Visit: Payer: Medicare Other | Admitting: Pharmacist

## 2022-06-17 NOTE — Progress Notes (Signed)
Care Management & Coordination Services Pharmacy Note  06/18/2022 Name:  [name] [name] MRN:  [id number] DOB:  [birth year]  Summary: PharmD FU visit.  During call patient seemed very short of breath.  She reports SOB at rest and this was even ongoing in hospital.  Likely due to fluid buildup in lungs as she had bilateral pleural effusion in hospital.  Recommendations/Changes made from today's visit: CMA tried to reach out to schedule in office visit for 06/18/22  Follow up plan: FU 30 days   Subjective: [name] [name] is an 57 y.o. year old female who is a primary patient of Pickard, Cammie Mcgee, MD.  The care coordination team was consulted for assistance with disease management and care coordination needs.    Engaged with patient by telephone for follow up visit.  Recent office visits:  05/10/2022 [facility] ([facility]am [facility]) Brunetta Jeans, PA-C; Keflex 500 mg 3 times daily for 7 days.   Recent consult visits:  None   Hospital visits:  05/21/22 - Admisstion - severe sepsis.     Objective:  Lab Results  Component Value Date   CREATININE 1.11 (H) 06/08/2022   BUN 37 (H) 06/08/2022   EGFR 103 02/01/2022   GFRNONAA 58 (L) 06/08/2022   GFRAA 119 06/27/2020   NA 137 06/08/2022   K 4.0 06/08/2022   CALCIUM 7.8 (L) 06/08/2022   CO2 22 06/08/2022   GLUCOSE 107 (H) 06/08/2022    Lab Results  Component Value Date/Time   HGBA1C 5.3 04/08/2022 11:17 AM   HGBA1C 5.6 08/26/2019 06:56 AM    Last diabetic Eye exam:  Lab Results  Component Value Date/Time   HMDIABEYEEXA No Retinopathy 12/10/2021 07:45 AM    Last diabetic Foot exam: No results found for: "HMDIABFOOTEX"   Lab Results  Component Value Date   CHOL 187 09/18/2021   HDL 67 09/18/2021   LDLCALC 84 09/18/2021   TRIG 271 (H) 09/18/2021   CHOLHDL 2.8 09/18/2021       Latest Ref Rng & Units 06/07/2022    6:50 AM 06/05/2022   11:54 AM 06/02/2022    4:18 AM  Hepatic Function  Total Protein 6.5 - 8.1 g/dL 5.4  5.7    Albumin  3.5 - 5.0 g/dL 2.3  2.5  2.4   AST 15 - 41 U/L 11  11    ALT 0 - 44 U/L 10  8    Alk Phosphatase 38 - 126 U/L 62  55    Total Bilirubin 0.3 - 1.2 mg/dL 0.6  0.5    Bilirubin, Direct 0.0 - 0.2 mg/dL 0.1       Lab Results  Component Value Date/Time   TSH 2.259 06/07/2022 05:19 PM   TSH 2.41 02/01/2022 12:49 PM   TSH 1.694 09/25/2017 06:42 AM   TSH 2.567 08/18/2012 11:36 AM       Latest Ref Rng & Units 06/08/2022    4:38 PM 06/07/2022    6:50 AM 06/05/2022   11:54 AM  CBC  WBC 4.0 - 10.5 K/uL  5.8  5.6   Hemoglobin 12.0 - 15.0 g/dL 7.9  7.4  8.1   Hematocrit 36.0 - 46.0 % 24.1  23.2  24.6   Platelets 150 - 400 K/uL  222  274     Lab Results  Component Value Date/Time   VITAMINB12 764 02/01/2022 12:49 PM    Clinical ASCVD: No  The 10-year ASCVD risk score (Arnett DK, et al., 2019) is: 7.9%  Values used to calculate the score:     Age: 60 years     Sex: Female     Is Non-Hispanic African American: No     Diabetic: Yes     Tobacco smoker: No     Systolic Blood Pressure: 0000000 mmHg     Is BP treated: Yes     HDL Cholesterol: 67 mg/dL     Total Cholesterol: 187 mg/dL        09/18/2021   10:12 AM 08/21/2020    2:15 PM 02/08/2020   11:38 AM  Depression screen PHQ 2/9  Decreased Interest 0 0 0  Down, Depressed, Hopeless 0 0 0  PHQ - 2 Score 0 0 0     Social History   Tobacco Use  Smoking Status Never  Smokeless Tobacco Never   BP Readings from Last 3 Encounters:  06/15/22 (!) 153/87  04/15/22 106/69  04/08/22 138/84   Pulse Readings from Last 3 Encounters:  06/15/22 64  04/15/22 79  04/08/22 90   Wt Readings from Last 3 Encounters:  05/27/22 230 lb (104.3 kg)  04/14/22 245 lb (111.1 kg)  04/08/22 245 lb (111.1 kg)   BMI Readings from Last 3 Encounters:  05/27/22 39.48 kg/m  04/14/22 42.05 kg/m  04/08/22 42.05 kg/m    Allergies  Allergen Reactions   Other    Tetracyclines & Related Other (See Comments)    Syncope and put her "in a coma"    Tramadol Other (See Comments)    Seizures   Phenazopyridine Other (See Comments)    Unknown   Tetracycline Other (See Comments)    Syncope and "Put me in a coma"   Ciprofloxacin Rash and Itching   Codeine Itching and Rash   Estradiol Rash    Patches broke out the skin    Medications Reviewed Today     Reviewed by Edythe Clarity, [facility] (Pharmacist) on 06/18/22 at [place] List Status: <None>   Medication Order Taking? Sig Documenting Provider Last Dose Status Informant  acetaminophen (TYLENOL) 500 MG tablet [id number] Yes Take 1 tablet (500 mg total) by mouth every 8 (eight) hours as needed for moderate pain. Chriss Czar, PA-C Taking Active Self, Pharmacy Records  albuterol [facility] HFA) 108 (90 Base) MCG/ACT inhaler [id number] Yes Inhale 2 puffs = 146mg into the lungs every 6 (six) hours as needed for wheezing or shortness of breath. MEulogio Bear NP Taking Active Self, Pharmacy Records  busPIRone (BUSPAR) 15 MG tablet [id number] Take 15 mg by mouth 3 (three) times daily.  [provider] Taking Active Self, Pharmacy Records  Calcium Carbonate-Vit D-Min (CALCIUM 1200 PO) [id number] Take 1,200 mg by mouth daily.  [provider] Taking Active Self, Pharmacy Records  DULoxetine (CYMBALTA) 20 MG capsule [id number] Take 1 capsule (20 mg total) by mouth daily. SSidney Ace MD Taking Active   Erenumab-aooe (AIMOVIG) 735MG/ML SDarden [id number] administer 143mUNDER THE SKIN every 30 DAYS PiSusy FrizzleMD Taking Active Self, Pharmacy Records  escitalopram (LEXAPRO) 10 MG tablet [id number] Take 10 mg by mouth daily. [provider] Taking Active Self, Pharmacy Records  estradiol (ESTRACE) 1 MG tablet [id number] Take 1 tablet (1 mg total) by mouth at bedtime. PiSusy FrizzleMD Taking Active Self, Pharmacy Records  famotidine (PEPCID) 40 MG tablet [id number] TAKE ONE TABLET BY MOUTH EVERYDAY AT BEDTIME PiSusy FrizzleMD  Taking Active Self, Pharmacy Records  haloperidol (HALDOL)  5 MG tablet [id number] Yes Take 1 tablet (5 mg total) by mouth at bedtime. For psychosis Barton [place], MD Taking Active Self, Pharmacy Records  lamoTRIgine (LAMICTAL) 100 MG tablet [id number] Yes Take 100 mg by mouth daily. [provider] Taking Active Self, Pharmacy Records  levocetirizine (XYZAL) 5 MG tablet [id number] Yes TAKE ONE TABLET BY MOUTH EVERY EVENING [name], MD Taking Active Self, Pharmacy Records  lidocaine (LIDODERM) 5 % [id number] Yes Place 1 patch onto the skin daily as needed (pain). Remove & Discard patch within 12 hours or as directed by MD [provider] Taking Active Self, Pharmacy Records  LORazepam (ATIVAN) 1 MG tablet [id number] Yes Take 1 tablet (1 mg total) by mouth every 6 (six) hours as needed for up to 5 days (intractable nausea). Sidney Ace, MD Taking Active   meloxicam (MOBIC) 15 MG tablet [id number] Yes Take 15 mg by mouth daily. [provider] Taking Active Self, Pharmacy Records  Multiple Vitamins-Minerals (MULTIVITAMIN WITH MINERALS) tablet [id number] Yes Take 1 tablet by mouth daily. [provider] Taking Active Self, Pharmacy Records  naloxone [facility]) nasal spray 4 mg/0.1 mL [id number] Yes Place 0.4 mg into the nose once. [provider] Taking Active Self, Pharmacy Records  oxyCODONE-acetaminophen (PERCOCET) 10-325 MG tablet [id number] Yes Take 1 tablet by mouth 4 (four) times daily as needed for pain. [provider] Taking Active Self, Pharmacy Records  pantoprazole (PROTONIX) 40 MG tablet [id number] Yes Take 1 tablet (40 mg total) by mouth 2 (two) times daily. [name], MD Taking Active Self, Pharmacy Records  pregabalin [facility]) 100 MG capsule [id number] Yes Take 1 capsule (100 mg total) by mouth 2 (two) times daily.  Patient taking differently: Take 100 mg by mouth 3 (three) times daily.   [name], MD Taking Active  Self, Pharmacy Records  promethazine [facility]) 12.5 MG tablet [id number] Yes Take 1 tablet (12.5 mg total) by mouth every 6 (six) hours as needed for nausea or vomiting. Sidney Ace, MD Taking Active   RELISTOR 150 MG TABS [id number] Yes Take 450 mg by mouth every morning. [provider] Taking Active Self, Pharmacy Records  solifenacin (VESICARE) 10 MG tablet [id number] Yes TAKE ONE TABLET BY MOUTH DAILY [name], MD Taking Active Self, Pharmacy Records  spironolactone (ALDACTONE) 50 MG tablet [id number] Yes TAKE ONE TABLET BY MOUTH ONCE DAILY [name], MD Taking Active   topiramate (TOPAMAX) 50 MG tablet [id number] Yes TAKE ONE TABLET BY MOUTH TWICE DAILY [name], MD Taking Active Self, Pharmacy Records  traZODone (DESYREL) 100 MG tablet [id number] Yes Take 200 mg by mouth at bedtime as needed for sleep. [provider] Taking Active Self, Pharmacy Records           Med Note Lannette Donath   Tue Jun 09, 2021  5:32 AM)    Med List Note Arby Barrette, CPhT 05/25/22 1401): Per patient, she receives compliance bubble packaging from [name].            SDOH:  (Social Determinants of Health) assessments and interventions performed: Yes Financial Resource Strain: Low Risk  (06/18/2022)   Overall Financial Resource Strain (CARDIA)    Difficulty of Paying Living Expenses: Not very hard    SDOH Interventions    Flowsheet Row Telephone from 06/16/2022 in [place] Admission (Discharged) from 04/14/2022 in [place] Telephone from 03/23/2022 in [place]  Coordination Clinical Support from 08/21/2020 in [place] Office Visit from 05/05/2017 in [place] Interventions       Food Insecurity Interventions -- Intervention Not Indicated Intervention Not Indicated Intervention Not Indicated --   Housing Interventions Intervention Not Indicated Intervention Not Indicated -- Intervention Not Indicated --  Transportation Interventions Intervention Not Indicated Intervention Not Indicated Intervention Not Indicated  [patient's mother provides transportation] Intervention Not Indicated --  Utilities Interventions -- Intervention Not Indicated -- -- --  Depression Interventions/Treatment  -- -- -- -- Currently on Treatment  Financial Strain Interventions -- -- -- Intervention Not Indicated --  Physical Activity Interventions -- -- -- Intervention Not Indicated --  Stress Interventions -- -- -- Intervention Not Indicated --  Social Connections Interventions -- -- -- Intervention Not Indicated --       Medication Assistance: None required.  Patient affirms current coverage meets needs.  Medication Access: Within the past 30 days, how often has patient missed a dose of medication? 0 Is a pillbox or other method used to improve adherence? Yes  Factors that may affect medication adherence? no barriers identified Are meds synced by current pharmacy? No  Are meds delivered by current pharmacy? No  Does patient experience delays in picking up medications due to transportation concerns? No   [facility] Reviewed: Is patient disadvantaged to use [facility]?: No  Current Rx insurance plan: [facility] Name and location of Current pharmacy:  [address] [place] [address] Phone: [phone] Fax: [phone]  [facility] - [place], [place] - [address] [place] [address] Phone: [phone] Fax: [phone]  [id number] [place] [id number]: [phone]: [phone] [id number] [place], [place] [address]: [phone]: [phone] services reviewed with patient today?: Yes  Patient requests to transfer care to [facility]?: No  Reason patient declined to change pharmacies: Patient is already actively enrolled with [facility]  Compliance/Adherence/Medication fill history: Care Gaps: None     Care Gaps: Annual wellness visit in last year? No   Assessment/Plan    Hypertension (BP goal <140/90) -Controlled -Current treatment: Metoprolol XL '25mg'$  daily Appropriate, Effective, Safe, Accessible -Medications previously tried: HCTZ  -Current home readings: 120s/75 -Current exercise habits: minimal -Denies hypotensive/hypertensive symptoms -Educated on BP goals and benefits of medications for prevention of heart attack, stroke and kidney damage; Importance of home blood pressure monitoring; Symptoms of hypotension and importance of maintaining adequate hydration; -Counseled to monitor BP at home a few times per week, document, and provide log at future appointments -Recommended to continue current medication BP controlled at home per her reports.  Pleural Effusions/SOB (Goal: Euvolemia, improve breathing) -Not ideally controlled -Current treatment  None noted -Medications previously tried: none noted  - Patient reports that  she is having shortness of breath since about 3-4 days before she was released from hospital.  Reports the doctor told her she has fluid around her heart and lungs.  CT shows bilateral pleural effusions.  She sounded out of breath at rest while talking on the phone.  I was concerned for re-admission so advised her to make appointment with PCP for tomorrow because her next scheduled  visit was 3/8.  Had acute renal failure in hospital so Lasix was stopped or not given.  Albuterol inhaler does not help. Continue to follow closely.   Asthma (Goal: control symptoms and prevent  exacerbations) 06/17/22 -Controlled -Current treatment  Albuterol HFA 90 mcg prn Appropriate, Effective, Safe, Accessible -Medications previously tried: none noted  -Pulmonary function testing: Pulmonary Functions Testing Results: No results found for: FEV1, FVC, FEV1FVC, TLC, DLCO -Exacerbations requiring treatment in last 6 months: none -Patient denies consistent use of maintenance inhaler -Frequency of rescue inhaler use: as needed -Counseled on When to use rescue inhaler -Having SOB now but likely unrelated to asthma.  She is bilateral pleural effusion.  Seems to need some sort of fluid relief.  Caution in the setting of recent hospitalization and acute kidney failure.   Depression/Anxiety/Bipolar (Goal: Reduce/minimize symptoms) 12/10/21 -Controlled -Current treatment: Buspirone '15mg'$  TID Appropriate, Effective, Safe, Accessible Duloxetine '60mg'$  daily  Appropriate, Effective, Safe, Accessible Lexapro '10mg'$  daily  Appropriate, Effective, Safe, Accessible Haloperidol '5mg'$  hs  Appropriate, Effective, Safe, Accessible Prazosin '2mg'$  hs  Appropriate, Effective, Safe, Accessible Lamictal '100mg'$  once daily Appropriate, Effective, Safe, Effective -Medications previously tried/failed: none noted -PHQ9:     09/18/2021   10:12 AM 08/21/2020    2:15 PM 02/08/2020   11:38 AM  PHQ9 SCORE ONLY  PHQ-9 Total Score 0 0 0   Managed by psych, recently added Lamictal and it has been titrated up to '100mg'$ . She is still having anger outbursts that she states she cannot control. Declines counseling at this time, discussed lifestyle mods such as exercise.  Denies any suicidal ideation or harmful thoughts. For now continue, recommend FU with psych if no improvement with increased lamictal.  Migraines (Goal: Reduce migraines) -Controlled -Current treatment  Aimovig '70mg'$  once monthly  Appropriate, Effective, Safe, Accessible Topamax '50mg'$  BID  Appropriate, Effective, Safe, Accessible -Medications previously  tried: none noted -Denies any migraines in about 1 year  -Recommended to continue current medication Meds are all affordable as she has medicare/medicaid.          Beverly Milch, PharmD, CPP Clinical Pharmacist Practitioner [place] [phone]

## 2022-06-18 ENCOUNTER — Other Ambulatory Visit: Payer: Self-pay

## 2022-06-18 ENCOUNTER — Telehealth: Payer: Self-pay

## 2022-06-18 ENCOUNTER — Ambulatory Visit (INDEPENDENT_AMBULATORY_CARE_PROVIDER_SITE_OTHER): Payer: 59 | Admitting: Family Medicine

## 2022-06-18 ENCOUNTER — Encounter: Payer: Self-pay | Admitting: Family Medicine

## 2022-06-18 ENCOUNTER — Encounter: Payer: 59 | Admitting: Podiatry

## 2022-06-18 VITALS — BP 168/98 | HR 78 | Temp 97.9°F | Ht 64.0 in | Wt 267.0 lb

## 2022-06-18 DIAGNOSIS — R609 Edema, unspecified: Secondary | ICD-10-CM | POA: Diagnosis not present

## 2022-06-18 DIAGNOSIS — N289 Disorder of kidney and ureter, unspecified: Secondary | ICD-10-CM

## 2022-06-18 DIAGNOSIS — R0602 Shortness of breath: Secondary | ICD-10-CM | POA: Diagnosis not present

## 2022-06-18 DIAGNOSIS — Z23 Encounter for immunization: Secondary | ICD-10-CM

## 2022-06-18 MED ORDER — FUROSEMIDE 40 MG PO TABS: 40.0000 mg | ORAL_TABLET | Freq: Every day | ORAL | 3 refills | Status: DC

## 2022-06-18 NOTE — Addendum Note (Signed)
Addended by: Randal Buba K on: 06/18/2022 05:05 PM   Modules accepted: Orders

## 2022-06-18 NOTE — Telephone Encounter (Signed)
Per Dr. Dennard Schaumann, pt is coming in today at 415pm. Spoke w/pt's mom and is aware.

## 2022-06-18 NOTE — Progress Notes (Signed)
Subjective:    Patient ID: [name] [name], female    DOB: Dec 05, 1965, 57 y.o.   MRN: [id number]  Shortness of Breath  Patient was recently admitted with a urinary tract infection.  She also had acute renal insufficiency.  She states that 5 days prior to discharge from the hospital she developed shortness of breath.  She states that she has shortness of breath when lying down in bed.  She reports shortness of breath with activity.  She reports leg swelling.  She states that her weight has increased substantially. Wt Readings from Last 3 Encounters:  06/18/22 267 lb (121.1 kg)  05/27/22 230 lb (104.3 kg)  04/14/22 245 lb (111.1 kg)   Her weight has increased substantially according to our scales.  She was wearing the cam walker when his weight was recorded however she is still listed as 37 pounds heavier than earlier in February.  She received aggressive IV fluids due to acute renal insufficiency.  She denies any pleurisy.  She denies any hemoptysis.  She denies any chest pain.  She denies any fever or cough but she does have +1 pitting edema in both legs distal to the knee Past Medical History:  Diagnosis Date   Anxiety    Arthritis    Phreesia 02/08/2020   Asthma    mild intermittent   Asthma    Phreesia 02/08/2020   Bipolar 1 disorder ([place])    ect treatments last treatment Sep 02 1011   Depression    Depression    [employer]reesia 02/08/2020   Depression    [employer]reesia 07/10/2020   GERD (gastroesophageal reflux disease)    Headache    Hypertension    Pre-diabetes    Seizures ([place])    Thinks it was from taking Tramadol   Sleep apnea    wears CPAP, uncertain of setting   Substance abuse ([place])    [employer]reesia 02/08/2020   Past Surgical History:  Procedure Laterality Date   ABDOMINAL HYSTERECTOMY     AMPUTATION TOE Left 02/02/2018   Procedure: AMPUTATION TOE Left 4th toe;  Surgeon: [name], DPM;  Location: [facility];  Service: Podiatry;  Laterality: Left;   APPENDECTOMY N/A     Phreesia 02/08/2020   BACK SURGERY  2018   ACDF   C5-6 & C6-7 by Dr. Rennis Harding   CARPAL TUNNEL RELEASE     x2   I & D EXTREMITY Right 05/22/2022   Procedure: IRRIGATION AND DEBRIDEMENT EXTREMITY;  Surgeon: Felipa Furnace, DPM;  Location: [facility];  Service: Podiatry;  Laterality: Right;   INCISION AND DRAINAGE Right 05/24/2022   Procedure: RIGHT REVISION INCISION AND DRAINAGE/ WASHOUT WITH PRIMARY DELAYED CLOSURE;  Surgeon: [name], DPM;  Location: [facility];  Service: Podiatry;  Laterality: Right;   IRRIGATION AND DEBRIDEMENT ABSCESS Right 10/25/2020   Procedure: IRRIGATION AND DEBRIDEMENT ABSCESS OF FOOT AND APPLICATION OF GRAFT;  Surgeon: [name], DPM;  Location: [facility];  Service: Podiatry;  Laterality: Right;   Laproscopic knee surgery Right    NECK SURGERY  03/15/2017   ACDF   by Dr. Patrice Paradise   PLANTAR FASCIA RELEASE     x2   REVERSE SHOULDER ARTHROPLASTY Right 04/14/2022   Procedure: REVERSE SHOULDER ARTHROPLASTY;  Surgeon: Hiram Gash, MD;  Location: [facility];  Service: Orthopedics;  Laterality: Right;   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   Procedure: TRANSMETATARSAL AMPUTATION;  Surgeon: [name], DPM;  Location: [facility];  Service: Podiatry;  Laterality: Right;   TRANSMETATARSAL AMPUTATION Right 05/27/2022   Procedure: REVISION TRANSMETATARSAL AMPUTATION WITH DELAYED PRIMARY CLOSURE;  Surgeon: [name], DPM;  Location: [facility];  Service: Podiatry;  Laterality: Right;   Current Outpatient Medications on File Prior to Visit  Medication Sig Dispense Refill   acetaminophen (TYLENOL) 500 MG tablet Take 1 tablet (500 mg total) by mouth every 8 (eight) hours as needed for moderate pain. 100 tablet 2   albuterol (PROAIR HFA) 108 (90 Base) MCG/ACT inhaler Inhale 2 puffs = 151mg into the lungs every 6 (six) hours as needed for wheezing or shortness of breath. 8.5 g 3   busPIRone (BUSPAR) 15 MG tablet Take 15 mg by mouth 3 (three) times daily.   2   Calcium  Carbonate-Vit D-Min (CALCIUM 1200 PO) Take 1,200 mg by mouth daily.      DULoxetine (CYMBALTA) 20 MG capsule Take 1 capsule (20 mg total) by mouth daily. 30 capsule 0   Erenumab-aooe (AIMOVIG) 70 MG/ML SOAJ administer 112mUNDER THE SKIN every 30 DAYS 1 mL 5   escitalopram (LEXAPRO) 10 MG tablet Take 10 mg by mouth daily.     estradiol (ESTRACE) 1 MG tablet Take 1 tablet (1 mg total) by mouth at bedtime. 90 tablet 3   famotidine (PEPCID) 40 MG tablet TAKE ONE TABLET BY MOUTH EVERYDAY AT BEDTIME 90 tablet 0   haloperidol (HALDOL) 5 MG tablet Take 1 tablet (5 mg total) by mouth at bedtime. For psychosis     lamoTRIgine (LAMICTAL) 100 MG tablet Take 100 mg by mouth daily.     levocetirizine (XYZAL) 5 MG tablet TAKE ONE TABLET BY MOUTH EVERY EVENING 90 tablet 0   lidocaine (LIDODERM) 5 % Place 1 patch onto the skin daily as needed (pain). Remove & Discard patch within 12 hours or as directed by MD     LORazepam (ATIVAN) 1 MG tablet Take 1 tablet (1 mg total) by mouth every 6 (six) hours as needed for up to 5 days (intractable nausea). 20 tablet 0   meloxicam (MOBIC) 15 MG tablet Take 15 mg by mouth daily.     Multiple Vitamins-Minerals (MULTIVITAMIN WITH MINERALS) tablet Take 1 tablet by mouth daily.     naloxone (NARCAN) nasal spray 4 mg/0.1 mL Place 0.4 mg into the nose once.     oxyCODONE-acetaminophen (PERCOCET) 10-325 MG tablet Take 1 tablet by mouth 4 (four) times daily as needed for pain.     pantoprazole (PROTONIX) 40 MG tablet Take 1 tablet (40 mg total) by mouth 2 (two) times daily. 180 tablet 3   pregabalin (LYRICA) 100 MG capsule Take 1 capsule (100 mg total) by mouth 2 (two) times daily. (Patient taking differently: Take 100 mg by mouth 3 (three) times daily.) 90 capsule 3   promethazine (PHENERGAN) 12.5 MG tablet Take 1 tablet (12.5 mg total) by mouth every 6 (six) hours as needed for nausea or vomiting. 30 tablet 0   RELISTOR 150 MG TABS Take 450 mg by mouth every morning.      solifenacin (VESICARE) 10 MG tablet TAKE ONE TABLET BY MOUTH DAILY 90 tablet 0   spironolactone (ALDACTONE) 50 MG tablet TAKE ONE TABLET BY MOUTH ONCE DAILY 90 tablet 1   topiramate (TOPAMAX) 50 MG tablet TAKE ONE TABLET BY MOUTH TWICE DAILY 180 tablet 0   traZODone (DESYREL) 100 MG tablet Take 200 mg by mouth at bedtime as needed for sleep.  0   No current facility-administered medications on file prior  to visit.   Allergies  Allergen Reactions   Other    Tetracyclines & Related Other (See Comments)    Syncope and put her "in a coma"   Tramadol Other (See Comments)    Seizures   Phenazopyridine Other (See Comments)    Unknown   Tetracycline Other (See Comments)    Syncope and "Put me in a coma"   Ciprofloxacin Rash and Itching   Codeine Itching and Rash   Estradiol Rash    Patches broke out the skin   Social History   Socioeconomic History   Marital status: Widowed    Spouse name: Not on file   Number of children: 3   Years of education: 55   Highest education level: Not on file  Occupational History   Occupation: Disabled  Tobacco Use   Smoking status: Never   Smokeless tobacco: Never  Vaping Use   Vaping Use: Never used  Substance and Sexual Activity   Alcohol use: No   Drug use: No   Sexual activity: Not Currently  Other Topics Concern   Not on file  Social History Narrative   Patient drink 4 16oz caffeine drinks a day    Social Determinants of Health   Financial Resource Strain: Low Risk  (06/18/2022)   Overall Financial Resource Strain (CARDIA)    Difficulty of Paying Living Expenses: Not very hard  Food Insecurity: No Food Insecurity (06/18/2022)   Hunger Vital Sign    Worried About Running Out of Food in the Last Year: Never true    Ran Out of Food in the Last Year: Never true  Transportation Needs: No Transportation Needs (06/16/2022)   PRAPARE - Hydrologist (Medical): No    Lack of Transportation (Non-Medical): No  Physical  Activity: Inactive (08/21/2020)   Exercise Vital Sign    Days of Exercise per Week: 0 days    Minutes of Exercise per Session: 0 min  Stress: No Stress Concern Present (08/21/2020)   [place]    Feeling of Stress : Not at all  Social Connections: Socially Isolated (08/21/2020)   Social Connection and Isolation Panel [NHANES]    Frequency of Communication with Friends and Family: More than three times a week    Frequency of Social Gatherings with Friends and Family: Twice a week    Attends Religious Services: Never    Marine scientist or Organizations: No    Attends Archivist Meetings: Never    Marital Status: Widowed  Intimate Partner Violence: Not At Risk (05/22/2022)   Humiliation, Afraid, Rape, and Kick questionnaire    Fear of Current or Ex-Partner: No    Emotionally Abused: No    Physically Abused: No    Sexually Abused: No   Family History  Problem Relation Age of Onset   Diabetes Mother    COPD Mother    Hypertension Mother    Hyperlipidemia Mother    Sleep apnea Mother    Heart disease Father    Hyperlipidemia Father    Hypertension Father    Cancer Father    Sleep apnea Father    Heart disease Brother    Sleep apnea Brother    Heart disease Maternal Grandmother    Hypertension Maternal Grandmother    Sleep apnea Maternal Grandmother    Heart disease Maternal Grandfather    Kidney cancer Paternal Grandmother    Heart disease Paternal Grandfather  Heart disease Daughter       Review of Systems  Respiratory:  Positive for shortness of breath.   All other systems reviewed and are negative.      Objective:   Physical Exam Vitals reviewed.  Constitutional:      General: She is not in acute distress.    Appearance: She is well-developed. She is not diaphoretic.  HENT:     Head: Normocephalic and atraumatic.     Right Ear: External ear normal.     Left Ear: External ear  normal.     Nose: Nose normal.     Mouth/Throat:     Pharynx: No oropharyngeal exudate.  Eyes:     General: No scleral icterus.       Right eye: No discharge.        Left eye: No discharge.     Conjunctiva/sclera: Conjunctivae normal.     Pupils: Pupils are equal, round, and reactive to light.  Neck:     Thyroid: No thyromegaly.     Trachea: No tracheal deviation.  Cardiovascular:     Rate and Rhythm: Normal rate and regular rhythm.     Heart sounds: Normal heart sounds. No murmur heard.    No friction rub. No gallop.  Pulmonary:     Effort: Pulmonary effort is normal. No respiratory distress.     Breath sounds: No stridor. Examination of the right-lower field reveals rales. Examination of the left-lower field reveals rales. Rales present. No decreased breath sounds, wheezing or rhonchi.  Chest:     Chest wall: No tenderness.  Abdominal:     General: Bowel sounds are normal. There is no distension.     Palpations: Abdomen is soft. There is no mass.     Tenderness: There is no abdominal tenderness. There is no guarding or rebound.  Musculoskeletal:        General: No tenderness. Normal range of motion.     Cervical back: Normal range of motion and neck supple.     Right lower leg: Edema present.     Left lower leg: Edema present.  Lymphadenopathy:     Cervical: No cervical adenopathy.  Skin:    General: Skin is warm.     Coloration: Skin is not pale.     Findings: No erythema or rash.  Neurological:     Mental Status: She is alert and oriented to person, place, and time.     Cranial Nerves: No cranial nerve deficit.     Motor: No abnormal muscle tone.     Coordination: Coordination normal.     Deep Tendon Reflexes: Reflexes are normal and symmetric.  Psychiatric:        Behavior: Behavior normal.        Thought Content: Thought content normal.        Judgment: Judgment normal.           Assessment & Plan:  Shortness of breath - Plan: Brain natriuretic peptide,  BASIC METABOLIC PANEL WITH GFR, CBC with Differential/Platelet  Renal insufficiency  Edema, unspecified type  Given her recent hospitalization, my biggest concerns would be hospital-acquired pneumonia, pulmonary embolism, pulmonary edema.  She is afebrile.  Her pulse oximetry is 99% on room air.  Therefore I do not feel that the patient is doing with pneumonia.  She has bilateral crackles in both legs, a 30 pound weight gain, and pitting edema in her extremities so I feel that her exam is most consistent with pulmonary edema.  She denies any pleurisy or hemoptysis or chest pain.  Therefore I do not believe that she has a pulmonary embolism.  Therefore despite her recent renal insufficiency, I will start the patient on Lasix 40 mg a day.  Check a CBC, BMP, and a BNP.  Recheck the patient on Monday.  Seek medical attention immediately over the weekend if worsening.  I reviewed her discharge summary from the hospital

## 2022-06-19 LAB — CBC WITH DIFFERENTIAL/PLATELET
Absolute Monocytes: 516 cells/uL (ref 200–950)
Basophils Absolute: 27 cells/uL (ref 0–200)
Basophils Relative: 0.4 %
Eosinophils Absolute: 147 cells/uL (ref 15–500)
Eosinophils Relative: 2.2 %
HCT: 25.1 % — ABNORMAL LOW (ref 35.0–45.0)
Hemoglobin: 8.3 g/dL — ABNORMAL LOW (ref 11.7–15.5)
Lymphs Abs: 1809 cells/uL (ref 850–3900)
MCH: 31.6 pg (ref 27.0–33.0)
MCHC: 33.1 g/dL (ref 32.0–36.0)
MCV: 95.4 fL (ref 80.0–100.0)
MPV: 10.9 fL (ref 7.5–12.5)
Monocytes Relative: 7.7 %
Neutro Abs: 4201 cells/uL (ref 1500–7800)
Neutrophils Relative %: 62.7 %
Platelets: 265 10*3/uL (ref 140–400)
RBC: 2.63 10*6/uL — ABNORMAL LOW (ref 3.80–5.10)
RDW: 13.1 % (ref 11.0–15.0)
Total Lymphocyte: 27 %
WBC: 6.7 10*3/uL (ref 3.8–10.8)

## 2022-06-19 LAB — BASIC METABOLIC PANEL WITH GFR
BUN/Creatinine Ratio: 29 (calc) — ABNORMAL HIGH (ref 6–22)
BUN: 30 mg/dL — ABNORMAL HIGH (ref 7–25)
CO2: 22 mmol/L (ref 20–32)
Calcium: 8.5 mg/dL — ABNORMAL LOW (ref 8.6–10.4)
Chloride: 109 mmol/L (ref 98–110)
Creat: 1.05 mg/dL — ABNORMAL HIGH (ref 0.50–1.03)
Glucose, Bld: 82 mg/dL (ref 65–99)
Potassium: 5.2 mmol/L (ref 3.5–5.3)
Sodium: 139 mmol/L (ref 135–146)
eGFR: 62 mL/min/{1.73_m2} (ref >=60)

## 2022-06-19 LAB — BRAIN NATRIURETIC PEPTIDE: Brain Natriuretic Peptide: 259 pg/mL — ABNORMAL HIGH (ref <100)

## 2022-06-21 ENCOUNTER — Telehealth: Payer: Self-pay | Admitting: Pharmacist

## 2022-06-21 ENCOUNTER — Ambulatory Visit (INDEPENDENT_AMBULATORY_CARE_PROVIDER_SITE_OTHER): Payer: 59 | Admitting: Family Medicine

## 2022-06-21 ENCOUNTER — Telehealth: Payer: Self-pay | Admitting: Family Medicine

## 2022-06-21 ENCOUNTER — Telehealth: Payer: Self-pay | Admitting: *Deleted

## 2022-06-21 ENCOUNTER — Encounter: Payer: Self-pay | Admitting: Family Medicine

## 2022-06-21 VITALS — BP 144/96 | HR 99 | Temp 97.6°F | Ht 64.0 in | Wt 263.2 lb

## 2022-06-21 DIAGNOSIS — R0602 Shortness of breath: Secondary | ICD-10-CM

## 2022-06-21 DIAGNOSIS — N289 Disorder of kidney and ureter, unspecified: Secondary | ICD-10-CM

## 2022-06-21 DIAGNOSIS — R609 Edema, unspecified: Secondary | ICD-10-CM | POA: Diagnosis not present

## 2022-06-21 DIAGNOSIS — D649 Anemia, unspecified: Secondary | ICD-10-CM

## 2022-06-21 NOTE — Telephone Encounter (Signed)
Received a call from Hazle Nordmann with [facility]; called on behalf of the patient to request therapy for patient's R shoulder.  Verbal order is fine. Please advise at [phone]; ok to leave a confidential voicemail if she doesn't answer.

## 2022-06-21 NOTE — Progress Notes (Signed)
Subjective:    Patient ID: [name] [name], female    DOB: 05/27/1965, 57 y.o.   MRN: [id number]  Shortness of Breath  06/18/22 Patient was recently admitted with a urinary tract infection.  She also had acute renal insufficiency.  She states that 5 days prior to discharge from the hospital she developed shortness of breath.  She states that she has shortness of breath when lying down in bed.  She reports shortness of breath with activity.  She reports leg swelling.  She states that her weight has increased substantially. Wt Readings from Last 3 Encounters:  06/18/22 267 lb (121.1 kg)  05/27/22 230 lb (104.3 kg)  04/14/22 245 lb (111.1 kg)   Her weight has increased substantially according to our scales.  She was wearing the cam walker when his weight was recorded however she is still listed as 37 pounds heavier than earlier in February.  She received aggressive IV fluids due to acute renal insufficiency.  She denies any pleurisy.  She denies any hemoptysis.  She denies any chest pain.  She denies any fever or cough but she does have +1 pitting edema in both legs distal to the knee.  At that time, my plan was:  Given her recent hospitalization, my biggest concerns would be hospital-acquired pneumonia, pulmonary embolism, pulmonary edema.  She is afebrile.  Her pulse oximetry is 99% on room air.  Therefore I do not feel that the patient is doing with pneumonia.  She has bilateral crackles in both legs, a 30 pound weight gain, and pitting edema in her extremities so I feel that her exam is most consistent with pulmonary edema.  She denies any pleurisy or hemoptysis or chest pain.  Therefore I do not believe that she has a pulmonary embolism.  Therefore despite her recent renal insufficiency, I will start the patient on Lasix 40 mg a day.  Check a CBC, BMP, and a BNP.  Recheck the patient on Monday.  Seek medical attention immediately over the weekend if worsening.  I reviewed her discharge summary from  the hospital  06/21/22 Office Visit on 06/18/2022  Component Date Value Ref Range Status   Glucose, Bld 06/18/2022 82  65 - 99 mg/dL Final   Comment: .            Fasting reference interval .    BUN 06/18/2022 30 (H)  7 - 25 mg/dL Final   Creat 06/18/2022 1.05 (H)  0.50 - 1.03 mg/dL Final   eGFR 06/18/2022 62  > OR = 60 mL/min/1.40m Final   BUN/Creatinine Ratio 06/18/2022 29 (H)  6 - 22 (calc) Final   Sodium 06/18/2022 139  135 - 146 mmol/L Final   Potassium 06/18/2022 5.2  3.5 - 5.3 mmol/L Final   Chloride 06/18/2022 109  98 - 110 mmol/L Final   CO2 06/18/2022 22  20 - 32 mmol/L Final   Calcium 06/18/2022 8.5 (L)  8.6 - 10.4 mg/dL Final   WBC 06/18/2022 6.7  3.8 - 10.8 Thousand/uL Final   RBC 06/18/2022 2.63 (L)  3.80 - 5.10 Million/uL Final   Hemoglobin 06/18/2022 8.3 (L)  11.7 - 15.5 g/dL Final   HCT 06/18/2022 25.1 (L)  35.0 - 45.0 % Final   MCV 06/18/2022 95.4  80.0 - 100.0 fL Final   MCH 06/18/2022 31.6  27.0 - 33.0 pg Final   MCHC 06/18/2022 33.1  32.0 - 36.0 g/dL Final   RDW 06/18/2022 13.1  11.0 - 15.0 % Final  Platelets 06/18/2022 265  140 - 400 Thousand/uL Final   MPV 06/18/2022 10.9  7.5 - 12.5 fL Final   Neutro Abs 06/18/2022 4,201  1,500 - 7,800 cells/uL Final   Lymphs Abs 06/18/2022 1,809  850 - 3,900 cells/uL Final   Absolute Monocytes 06/18/2022 516  200 - 950 cells/uL Final   Eosinophils Absolute 06/18/2022 147  15 - 500 cells/uL Final   Basophils Absolute 06/18/2022 27  0 - 200 cells/uL Final   Neutrophils Relative % 06/18/2022 62.7  % Final   Total Lymphocyte 06/18/2022 27.0  % Final   Monocytes Relative 06/18/2022 7.7  % Final   Eosinophils Relative 06/18/2022 2.2  % Final   Basophils Relative 06/18/2022 0.4  % Final   Brain Natriuretic Peptide 06/18/2022 259 (H)  <100 pg/mL Final   Comment: . BNP levels increase with age in the general population with the highest values seen in individuals greater than 49 years of age. Reference: J. Am. [place] Ar. Cardiol.  [id number]. Marland Kitchen   No results displayed because visit has over 200 results.    Reviewing her medication list, she is on spironolactone for androgenic alopecia.  She does not feel that this medication is helping her.  Given her recent renal insufficiency I would like to remove this from her medication list.  She is also on meloxicam that she takes for arthritis.  However she was recently admitted with acute renal insufficiency with creatinine up to 3.  She is still taking meloxicam.  Furthermore, her hemoglobin has dropped considerably.  In December it was 12.7.  February 2 was 12.0.  By discharge from the hospital, it was 7.9.  She is not seen any obvious source of bleeding however she is on NSAIDs raising the concern about possible upper GI source such as gastritis.  Therefore I want her to hold the meloxicam for this.  She is taking an iron pill.  She states that her breathing has not improved.  She is still slightly short of breath.  However her pulse oximetry is 99% on room air and she has lost 4 pounds.  However she still has pitting edema in both legs  Past Medical History:  Diagnosis Date   Anxiety    Arthritis    Phreesia 02/08/2020   Asthma    mild intermittent   Asthma    Phreesia 02/08/2020   Bipolar 1 disorder ([place])    ect treatments last treatment Sep 02 1011   Depression    Depression    Phreesia 02/08/2020   Depression    Phreesia 07/10/2020   GERD (gastroesophageal reflux disease)    Headache    Hypertension    Pre-diabetes    Seizures ([place])    Thinks it was from taking Tramadol   Sleep apnea    wears CPAP, uncertain of setting   Substance abuse ([name])    Phreesia 02/08/2020   Past Surgical History:  Procedure Laterality Date   ABDOMINAL HYSTERECTOMY     AMPUTATION TOE Left 02/02/2018   Procedure: AMPUTATION TOE Left 4th toe;  Surgeon: [name], DPM;  Location: [place];  Service: Podiatry;  Laterality: Left;   APPENDECTOMY N/A    Phreesia 02/08/2020    BACK SURGERY  2018   ACDF   C5-6 & C6-7 by Dr. Rennis Harding   CARPAL TUNNEL RELEASE     x2   I & D EXTREMITY Right 05/22/2022   Procedure: IRRIGATION AND DEBRIDEMENT EXTREMITY;  Surgeon: Felipa Furnace, DPM;  Location: [facility];  Service: Podiatry;  Laterality: Right;   INCISION AND DRAINAGE Right 05/24/2022   Procedure: RIGHT REVISION INCISION AND DRAINAGE/ WASHOUT WITH PRIMARY DELAYED CLOSURE;  Surgeon: [name], DPM;  Location: [facility];  Service: Podiatry;  Laterality: Right;   IRRIGATION AND DEBRIDEMENT ABSCESS Right 10/25/2020   Procedure: IRRIGATION AND DEBRIDEMENT ABSCESS OF FOOT AND APPLICATION OF GRAFT;  Surgeon: [name], DPM;  Location: [place];  Service: Podiatry;  Laterality: Right;   Laproscopic knee surgery Right    NECK SURGERY  03/15/2017   ACDF   by Dr. Patrice Paradise   PLANTAR FASCIA RELEASE     x2   REVERSE SHOULDER ARTHROPLASTY Right 04/14/2022   Procedure: REVERSE SHOULDER ARTHROPLASTY;  Surgeon: Hiram Gash, MD;  Location: [facility];  Service: Orthopedics;  Laterality: Right;   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   Procedure: TRANSMETATARSAL AMPUTATION;  Surgeon: [name], DPM;  Location: [facility];  Service: Podiatry;  Laterality: Right;   TRANSMETATARSAL AMPUTATION Right 05/27/2022   Procedure: REVISION TRANSMETATARSAL AMPUTATION WITH DELAYED PRIMARY CLOSURE;  Surgeon: [name], DPM;  Location: [facility];  Service: Podiatry;  Laterality: Right;   Current Outpatient Medications on File Prior to Visit  Medication Sig Dispense Refill   acetaminophen (TYLENOL) 500 MG tablet Take 1 tablet (500 mg total) by mouth every 8 (eight) hours as needed for moderate pain. 100 tablet 2   albuterol (PROAIR HFA) 108 (90 Base) MCG/ACT inhaler Inhale 2 puffs = 142mg into the lungs every 6 (six) hours as needed for wheezing or shortness of breath. 8.5 g 3   busPIRone (BUSPAR) 15 MG tablet Take 15 mg by mouth 3 (three) times daily.   2   Calcium Carbonate-Vit D-Min  (CALCIUM 1200 PO) Take 1,200 mg by mouth daily.      DULoxetine (CYMBALTA) 20 MG capsule Take 1 capsule (20 mg total) by mouth daily. 30 capsule 0   Erenumab-aooe (AIMOVIG) 70 MG/ML SOAJ administer 133mUNDER THE SKIN every 30 DAYS 1 mL 5   escitalopram (LEXAPRO) 10 MG tablet Take 10 mg by mouth daily.     estradiol (ESTRACE) 1 MG tablet Take 1 tablet (1 mg total) by mouth at bedtime. 90 tablet 3   famotidine (PEPCID) 40 MG tablet TAKE ONE TABLET BY MOUTH EVERYDAY AT BEDTIME 90 tablet 0   furosemide (LASIX) 40 MG tablet Take 1 tablet (40 mg total) by mouth daily. 30 tablet 3   haloperidol (HALDOL) 5 MG tablet Take 1 tablet (5 mg total) by mouth at bedtime. For psychosis     lamoTRIgine (LAMICTAL) 100 MG tablet Take 100 mg by mouth daily.     levocetirizine (XYZAL) 5 MG tablet TAKE ONE TABLET BY MOUTH EVERY EVENING 90 tablet 0   lidocaine (LIDODERM) 5 % Place 1 patch onto the skin daily as needed (pain). Remove & Discard patch within 12 hours or as directed by MD     meloxicam (MOBIC) 15 MG tablet Take 15 mg by mouth daily.     Multiple Vitamins-Minerals (MULTIVITAMIN WITH MINERALS) tablet Take 1 tablet by mouth daily.     naloxone (NARCAN) nasal spray 4 mg/0.1 mL Place 0.4 mg into the nose once.     oxyCODONE-acetaminophen (PERCOCET) 10-325 MG tablet Take 1 tablet by mouth 4 (four) times daily as needed for pain.     pantoprazole (PROTONIX) 40 MG tablet Take 1 tablet (40 mg total) by mouth 2 (two) times daily. 180 tablet 3  pregabalin (LYRICA) 100 MG capsule Take 1 capsule (100 mg total) by mouth 2 (two) times daily. (Patient taking differently: Take 100 mg by mouth 3 (three) times daily.) 90 capsule 3   promethazine (PHENERGAN) 12.5 MG tablet Take 1 tablet (12.5 mg total) by mouth every 6 (six) hours as needed for nausea or vomiting. 30 tablet 0   RELISTOR 150 MG TABS Take 450 mg by mouth every morning.     solifenacin (VESICARE) 10 MG tablet TAKE ONE TABLET BY MOUTH DAILY 90 tablet 0    spironolactone (ALDACTONE) 50 MG tablet TAKE ONE TABLET BY MOUTH ONCE DAILY 90 tablet 1   topiramate (TOPAMAX) 50 MG tablet TAKE ONE TABLET BY MOUTH TWICE DAILY 180 tablet 0   traZODone (DESYREL) 100 MG tablet Take 200 mg by mouth at bedtime as needed for sleep.  0   No current facility-administered medications on file prior to visit.   Allergies  Allergen Reactions   Other    Tetracyclines & Related Other (See Comments)    Syncope and put her "in a coma"   Tramadol Other (See Comments)    Seizures   Phenazopyridine Other (See Comments)    Unknown   Tetracycline Other (See Comments)    Syncope and "Put me in a coma"   Ciprofloxacin Rash and Itching   Codeine Itching and Rash   Estradiol Rash    Patches broke out the skin   Social History   Socioeconomic History   Marital status: Widowed    Spouse name: Not on file   Number of children: 3   Years of education: 68   Highest education level: Not on file  Occupational History   Occupation: Disabled  Tobacco Use   Smoking status: Never   Smokeless tobacco: Never  Vaping Use   Vaping Use: Never used  Substance and Sexual Activity   Alcohol use: No   Drug use: No   Sexual activity: Not Currently  Other Topics Concern   Not on file  Social History Narrative   Patient drink 4 16oz caffeine drinks a day    Social Determinants of Health   Financial Resource Strain: Low Risk  (06/18/2022)   Overall Financial Resource Strain (CARDIA)    Difficulty of Paying Living Expenses: Not very hard  Food Insecurity: No Food Insecurity (06/18/2022)   Hunger Vital Sign    Worried About Running Out of Food in the Last Year: Never true    Ran Out of Food in the Last Year: Never true  Transportation Needs: No Transportation Needs (06/16/2022)   PRAPARE - Hydrologist (Medical): No    Lack of Transportation (Non-Medical): No  Physical Activity: Inactive (08/21/2020)   Exercise Vital Sign    Days of Exercise per  Week: 0 days    Minutes of Exercise per Session: 0 min  Stress: No Stress Concern Present (08/21/2020)   [place]    Feeling of Stress : Not at all  Social Connections: Socially Isolated (08/21/2020)   Social Connection and Isolation Panel [NHANES]    Frequency of Communication with Friends and Family: More than three times a week    Frequency of Social Gatherings with Friends and Family: Twice a week    Attends Religious Services: Never    Marine scientist or Organizations: No    Attends Archivist Meetings: Never    Marital Status: Widowed  Intimate Partner Violence:  Not At Risk (05/22/2022)   Humiliation, Afraid, Rape, and Kick questionnaire    Fear of Current or Ex-Partner: No    Emotionally Abused: No    Physically Abused: No    Sexually Abused: No   Family History  Problem Relation Age of Onset   Diabetes Mother    COPD Mother    Hypertension Mother    Hyperlipidemia Mother    Sleep apnea Mother    Heart disease Father    Hyperlipidemia Father    Hypertension Father    Cancer Father    Sleep apnea Father    Heart disease Brother    Sleep apnea Brother    Heart disease Maternal Grandmother    Hypertension Maternal Grandmother    Sleep apnea Maternal Grandmother    Heart disease Maternal Grandfather    Kidney cancer Paternal Grandmother    Heart disease Paternal Grandfather    Heart disease Daughter       Review of Systems  Respiratory:  Positive for shortness of breath.   All other systems reviewed and are negative.      Objective:   Physical Exam Vitals reviewed.  Constitutional:      General: She is not in acute distress.    Appearance: She is well-developed. She is not diaphoretic.  HENT:     Head: Normocephalic and atraumatic.     Right Ear: External ear normal.     Left Ear: External ear normal.     Nose: Nose normal.     Mouth/Throat:     Pharynx: No oropharyngeal  exudate.  Eyes:     General: No scleral icterus.       Right eye: No discharge.        Left eye: No discharge.     Conjunctiva/sclera: Conjunctivae normal.     Pupils: Pupils are equal, round, and reactive to light.  Neck:     Thyroid: No thyromegaly.     Trachea: No tracheal deviation.  Cardiovascular:     Rate and Rhythm: Normal rate and regular rhythm.     Heart sounds: Normal heart sounds. No murmur heard.    No friction rub. No gallop.  Pulmonary:     Effort: Pulmonary effort is normal. No respiratory distress.     Breath sounds: No stridor. Examination of the right-lower field reveals rales. Examination of the left-lower field reveals rales. Rales present. No decreased breath sounds, wheezing or rhonchi.  Chest:     Chest wall: No tenderness.  Abdominal:     General: Bowel sounds are normal. There is no distension.     Palpations: Abdomen is soft. There is no mass.     Tenderness: There is no abdominal tenderness. There is no guarding or rebound.  Musculoskeletal:        General: No tenderness. Normal range of motion.     Cervical back: Normal range of motion and neck supple.     Right lower leg: Edema present.     Left lower leg: Edema present.  Lymphadenopathy:     Cervical: No cervical adenopathy.  Skin:    General: Skin is warm.     Coloration: Skin is not pale.     Findings: No erythema or rash.  Neurological:     Mental Status: She is alert and oriented to person, place, and time.     Cranial Nerves: No cranial nerve deficit.     Motor: No abnormal muscle tone.     Coordination: Coordination normal.  Deep Tendon Reflexes: Reflexes are normal and symmetric.  Psychiatric:        Behavior: Behavior normal.        Thought Content: Thought content normal.        Judgment: Judgment normal.           Assessment & Plan:  Shortness of breath - Plan: BASIC METABOLIC PANEL WITH GFR, CBC with Differential/Platelet, Iron, Vitamin B12, Fecal occult blood,  imunochemical, Ferritin  Renal insufficiency - Plan: BASIC METABOLIC PANEL WITH GFR, CBC with Differential/Platelet, Iron, Vitamin B12, Fecal occult blood, imunochemical, Ferritin  Edema, unspecified type - Plan: BASIC METABOLIC PANEL WITH GFR, CBC with Differential/Platelet, Iron, Vitamin B12, Fecal occult blood, imunochemical, Ferritin  Anemia, unspecified type - Plan: BASIC METABOLIC PANEL WITH GFR, CBC with Differential/Platelet, Iron, Vitamin B12, Fecal occult blood, imunochemical, Ferritin First, discontinue meloxicam due to renal insufficiency.  Discontinue spironolactone due to potential renal toxicity.  Second continue Lasix 40 mg a day as the patient still appears fluid overloaded with significant peripheral edema.  Recheck the patient on Friday.  Third, the patient has anemia from an undiagnosed source.  Check an iron level, B12 level, and stool for blood.  Meanwhile discontinue meloxicam.  Patient is already taking an over-the-counter iron pill.  Repeat CBC today.

## 2022-06-21 NOTE — Progress Notes (Signed)
Care Management & Coordination Services Pharmacy Team  Reason for Encounter: Medication coordination and delivery  Contacted patient to discuss medications and coordinate delivery from [facility]. Spoke with patient on 06/21/2022  Cycle dispensing form sent to [facility] for review.   Last adherence delivery date: 06/01/2022      Patient is due for next adherence delivery on: 06/30/2022  This delivery to include: Vials  30 Days   Aimovig 70 mg/mL once a month Solifenacin 10 mg once daily Estradiol 1 mg once daily Cyclobenzaprine 10 mg twice daily as needed Famotidine 40 mg at bedtime Pantoprazole 40 mg twice daily Duloxetine 20 mg once daily - patient reports decrease Pregabalin 100 mg Levocetirizine 5 mg once daily Lamotrigine '100mg'$  daily Topiramate 50 mg twice daily Prazosin 2 mg at bedtime Haloperidol 5 mg at bedtime Escitalopram 10 mg once daily Metoprolol 25 mg three tablets daily Buspirone 15 mg three times daily Trazodone 100 mg two tablets at bedtime as neede  No refill request needed.  Confirmed delivery date of 06/30/2022, advised patient that pharmacy will contact them the morning of delivery.   Any concerns about your medications? No  How often do you forget or accidentally miss a dose? Never  Do you use a pillbox? No  Is patient in packaging No  If yes  What is the date on your next pill pack?  Any concerns or issues with your packaging?   Chart review: Recent office visits:  06/18/2022 [facility] (PCP) [name], MD; I will start the patient on Lasix 40 mg a day.   Recent consult visits:  None  Hospital visits:  None in previous 6 months  Medications: Outpatient Encounter Medications as of 06/21/2022  Medication Sig   acetaminophen (TYLENOL) 500 MG tablet Take 1 tablet (500 mg total) by mouth every 8 (eight) hours as needed for moderate pain.   albuterol (PROAIR HFA) 108 (90 Base) MCG/ACT inhaler Inhale 2 puffs = 127mg into the lungs  every 6 (six) hours as needed for wheezing or shortness of breath.   busPIRone (BUSPAR) 15 MG tablet Take 15 mg by mouth 3 (three) times daily.    Calcium Carbonate-Vit D-Min (CALCIUM 1200 PO) Take 1,200 mg by mouth daily.    DULoxetine (CYMBALTA) 20 MG capsule Take 1 capsule (20 mg total) by mouth daily.   Erenumab-aooe (AIMOVIG) 70 MG/ML SOAJ administer 13mUNDER THE SKIN every 30 DAYS   escitalopram (LEXAPRO) 10 MG tablet Take 10 mg by mouth daily.   estradiol (ESTRACE) 1 MG tablet Take 1 tablet (1 mg total) by mouth at bedtime.   famotidine (PEPCID) 40 MG tablet TAKE ONE TABLET BY MOUTH EVERYDAY AT BEDTIME   furosemide (LASIX) 40 MG tablet Take 1 tablet (40 mg total) by mouth daily.   haloperidol (HALDOL) 5 MG tablet Take 1 tablet (5 mg total) by mouth at bedtime. For psychosis   lamoTRIgine (LAMICTAL) 100 MG tablet Take 100 mg by mouth daily.   levocetirizine (XYZAL) 5 MG tablet TAKE ONE TABLET BY MOUTH EVERY EVENING   lidocaine (LIDODERM) 5 % Place 1 patch onto the skin daily as needed (pain). Remove & Discard patch within 12 hours or as directed by MD   meloxicam (MOBIC) 15 MG tablet Take 15 mg by mouth daily.   Multiple Vitamins-Minerals (MULTIVITAMIN WITH MINERALS) tablet Take 1 tablet by mouth daily.   naloxone (NARCAN) nasal spray 4 mg/0.1 mL Place 0.4 mg into the nose once.   oxyCODONE-acetaminophen (PERCOCET) 10-325 MG tablet  Take 1 tablet by mouth 4 (four) times daily as needed for pain.   pantoprazole (PROTONIX) 40 MG tablet Take 1 tablet (40 mg total) by mouth 2 (two) times daily.   pregabalin (LYRICA) 100 MG capsule Take 1 capsule (100 mg total) by mouth 2 (two) times daily. (Patient taking differently: Take 100 mg by mouth 3 (three) times daily.)   promethazine (PHENERGAN) 12.5 MG tablet Take 1 tablet (12.5 mg total) by mouth every 6 (six) hours as needed for nausea or vomiting.   RELISTOR 150 MG TABS Take 450 mg by mouth every morning.   solifenacin (VESICARE) 10 MG tablet  TAKE ONE TABLET BY MOUTH DAILY   spironolactone (ALDACTONE) 50 MG tablet TAKE ONE TABLET BY MOUTH ONCE DAILY   topiramate (TOPAMAX) 50 MG tablet TAKE ONE TABLET BY MOUTH TWICE DAILY   traZODone (DESYREL) 100 MG tablet Take 200 mg by mouth at bedtime as needed for sleep.   No facility-administered encounter medications on file as of 06/21/2022.   BP Readings from Last 3 Encounters:  06/18/22 (!) 168/98  06/15/22 (!) 153/87  04/15/22 106/69    Pulse Readings from Last 3 Encounters:  06/18/22 78  06/15/22 64  04/15/22 79    Lab Results  Component Value Date/Time   HGBA1C 5.3 04/08/2022 11:17 AM   HGBA1C 5.6 08/26/2019 06:56 AM   Lab Results  Component Value Date   CREATININE 1.05 (H) 06/18/2022   BUN 30 (H) 06/18/2022   GFRNONAA 58 (L) 06/08/2022   GFRAA 119 06/27/2020   NA 139 06/18/2022   K 5.2 06/18/2022   CALCIUM 8.5 (L) 06/18/2022   CO2 22 06/18/2022     Future Appointments  Date Time Provider [place]  06/21/2022 11:00 AM [name], MD [facility]  06/22/2022  3:00 PM [facility] None  06/25/2022  9:15 AM [name], DPM [facility]  06/25/2022 11:15 AM [name], MD [facility]  07/29/2022 12:00 PM Edythe Clarity, [place] None   April D Calhoun, [place] Pharmacist Assistant [phone]

## 2022-06-21 NOTE — Addendum Note (Signed)
Addended by: Jenna Luo T on: 06/21/2022 12:14 PM   Modules accepted: Orders

## 2022-06-21 NOTE — Telephone Encounter (Signed)
[place](Tonya)nurse is requesting frequency for seeing patient after surgery, suggesting twice week for 1 week, one week for 3 weeks patient. She also wanted to let the physician aware that the patient  came home from hospital swollen, 40 lbs heavier, out of breath, sleeping in chair, furosemide is not working , was bleeding but when changed dressing,1 day ago was not , denies any pain but unable to feel,was elevating but not sure if icing. Please call@ [phone]-land line). Called patient to see how she was doing and encouraged her to contact her PCP concerning her lasix and it not helping,encouraged her to also continue to ice and elevate, verbalizing understanding.

## 2022-06-22 ENCOUNTER — Ambulatory Visit (INDEPENDENT_AMBULATORY_CARE_PROVIDER_SITE_OTHER): Payer: 59 | Admitting: Neurology

## 2022-06-22 ENCOUNTER — Other Ambulatory Visit: Payer: Self-pay

## 2022-06-22 DIAGNOSIS — Z820 Family history of epilepsy and other diseases of the nervous system: Secondary | ICD-10-CM

## 2022-06-22 DIAGNOSIS — G4733 Obstructive sleep apnea (adult) (pediatric): Secondary | ICD-10-CM | POA: Diagnosis not present

## 2022-06-22 DIAGNOSIS — R0602 Shortness of breath: Secondary | ICD-10-CM

## 2022-06-22 DIAGNOSIS — G4719 Other hypersomnia: Secondary | ICD-10-CM

## 2022-06-22 DIAGNOSIS — D649 Anemia, unspecified: Secondary | ICD-10-CM

## 2022-06-22 DIAGNOSIS — R635 Abnormal weight gain: Secondary | ICD-10-CM

## 2022-06-22 DIAGNOSIS — G4731 Primary central sleep apnea: Secondary | ICD-10-CM

## 2022-06-22 LAB — CBC WITH DIFFERENTIAL/PLATELET
Absolute Monocytes: 338 cells/uL (ref 200–950)
Basophils Absolute: 9 cells/uL (ref 0–200)
Basophils Relative: 0.2 %
Eosinophils Absolute: 80 cells/uL (ref 15–500)
Eosinophils Relative: 1.7 %
HCT: 24.3 % — ABNORMAL LOW (ref 35.0–45.0)
Hemoglobin: 7.7 g/dL — ABNORMAL LOW (ref 11.7–15.5)
Lymphs Abs: 1048 cells/uL (ref 850–3900)
MCH: 30.9 pg (ref 27.0–33.0)
MCHC: 31.7 g/dL — ABNORMAL LOW (ref 32.0–36.0)
MCV: 97.6 fL (ref 80.0–100.0)
MPV: 10.2 fL (ref 7.5–12.5)
Monocytes Relative: 7.2 %
Neutro Abs: 3224 cells/uL (ref 1500–7800)
Neutrophils Relative %: 68.6 %
Platelets: 226 10*3/uL (ref 140–400)
RBC: 2.49 10*6/uL — ABNORMAL LOW (ref 3.80–5.10)
RDW: 13 % (ref 11.0–15.0)
Total Lymphocyte: 22.3 %
WBC: 4.7 10*3/uL (ref 3.8–10.8)

## 2022-06-22 LAB — BASIC METABOLIC PANEL WITH GFR
BUN/Creatinine Ratio: 26 (calc) — ABNORMAL HIGH (ref 6–22)
BUN: 28 mg/dL — ABNORMAL HIGH (ref 7–25)
CO2: 21 mmol/L (ref 20–32)
Calcium: 8.7 mg/dL (ref 8.6–10.4)
Chloride: 110 mmol/L (ref 98–110)
Creat: 1.08 mg/dL — ABNORMAL HIGH (ref 0.50–1.03)
Glucose, Bld: 110 mg/dL — ABNORMAL HIGH (ref 65–99)
Potassium: 4.5 mmol/L (ref 3.5–5.3)
Sodium: 142 mmol/L (ref 135–146)
eGFR: 60 mL/min/{1.73_m2} (ref >=60)

## 2022-06-22 LAB — FERRITIN: Ferritin: 111 ng/mL (ref 16–232)

## 2022-06-22 LAB — IRON: Iron: 40 ug/dL — ABNORMAL LOW (ref 45–160)

## 2022-06-22 LAB — VITAMIN B12: Vitamin B-12: 788 pg/mL (ref 200–1100)

## 2022-06-22 MED ORDER — IRON (FERROUS SULFATE) 325 (65 FE) MG PO TABS: 325.0000 mg | ORAL_TABLET | Freq: Two times a day (BID) | ORAL | 1 refills | Status: DC

## 2022-06-22 NOTE — Progress Notes (Signed)
ferr

## 2022-06-23 ENCOUNTER — Telehealth: Payer: Self-pay

## 2022-06-23 ENCOUNTER — Telehealth: Payer: Self-pay | Admitting: Family Medicine

## 2022-06-23 NOTE — Progress Notes (Signed)
See procedure note.

## 2022-06-23 NOTE — Telephone Encounter (Signed)
Received call from April with [facility] to notify provider she has +2 edema to both lower extremities, SOB; lungs are clear.   Patient wants to know if she should take the iron called in and the one she has at home OR just the one prescribed.  Please advise April at [phone].

## 2022-06-23 NOTE — Telephone Encounter (Signed)
She was seen by her PCP on 06/18/2022.  Recommend following closely with PCP.  Okay for dressing changes 2x/week for first week, then 1x/week for 3 weeks if that is what the nurse is recommending. - Dr. Amalia Hailey

## 2022-06-23 NOTE — Telephone Encounter (Signed)
Thanks for the heads up.  Will plan to see on 06/25/2022.  Thanks, Dr. Amalia Hailey

## 2022-06-24 ENCOUNTER — Emergency Department (HOSPITAL_COMMUNITY): Payer: 59

## 2022-06-24 ENCOUNTER — Ambulatory Visit: Payer: Self-pay | Admitting: *Deleted

## 2022-06-24 ENCOUNTER — Inpatient Hospital Stay (HOSPITAL_COMMUNITY): Payer: 59

## 2022-06-24 ENCOUNTER — Telehealth: Payer: Self-pay

## 2022-06-24 ENCOUNTER — Encounter (HOSPITAL_COMMUNITY): Payer: Self-pay | Admitting: Internal Medicine

## 2022-06-24 ENCOUNTER — Inpatient Hospital Stay (HOSPITAL_COMMUNITY)
Admission: EM | Admit: 2022-06-24 | Discharge: 2022-06-27 | DRG: 291 | Disposition: A | Discharge status: Home Health | Payer: 59 | Attending: Internal Medicine | Admitting: Internal Medicine

## 2022-06-24 ENCOUNTER — Other Ambulatory Visit: Payer: Self-pay

## 2022-06-24 ENCOUNTER — Other Ambulatory Visit: Payer: Self-pay | Admitting: Family Medicine

## 2022-06-24 DIAGNOSIS — Z881 Allergy status to other antibiotic agents status: Secondary | ICD-10-CM

## 2022-06-24 DIAGNOSIS — R0602 Shortness of breath: Secondary | ICD-10-CM | POA: Diagnosis not present

## 2022-06-24 DIAGNOSIS — R609 Edema, unspecified: Principal | ICD-10-CM

## 2022-06-24 DIAGNOSIS — S90812A Abrasion, left foot, initial encounter: Secondary | ICD-10-CM | POA: Diagnosis present

## 2022-06-24 DIAGNOSIS — K219 Gastro-esophageal reflux disease without esophagitis: Secondary | ICD-10-CM | POA: Diagnosis present

## 2022-06-24 DIAGNOSIS — Z6841 Body Mass Index (BMI) 40.0 and over, adult: Secondary | ICD-10-CM | POA: Diagnosis not present

## 2022-06-24 DIAGNOSIS — Z885 Allergy status to narcotic agent status: Secondary | ICD-10-CM

## 2022-06-24 DIAGNOSIS — R262 Difficulty in walking, not elsewhere classified: Secondary | ICD-10-CM | POA: Diagnosis present

## 2022-06-24 DIAGNOSIS — R52 Pain, unspecified: Secondary | ICD-10-CM

## 2022-06-24 DIAGNOSIS — I509 Heart failure, unspecified: Secondary | ICD-10-CM

## 2022-06-24 DIAGNOSIS — Z96611 Presence of right artificial shoulder joint: Secondary | ICD-10-CM | POA: Diagnosis present

## 2022-06-24 DIAGNOSIS — Z8249 Family history of ischemic heart disease and other diseases of the circulatory system: Secondary | ICD-10-CM

## 2022-06-24 DIAGNOSIS — G8929 Other chronic pain: Secondary | ICD-10-CM | POA: Diagnosis present

## 2022-06-24 DIAGNOSIS — Z89422 Acquired absence of other left toe(s): Secondary | ICD-10-CM

## 2022-06-24 DIAGNOSIS — Z8619 Personal history of other infectious and parasitic diseases: Secondary | ICD-10-CM

## 2022-06-24 DIAGNOSIS — R7303 Prediabetes: Secondary | ICD-10-CM | POA: Diagnosis present

## 2022-06-24 DIAGNOSIS — Z833 Family history of diabetes mellitus: Secondary | ICD-10-CM | POA: Diagnosis not present

## 2022-06-24 DIAGNOSIS — F319 Bipolar disorder, unspecified: Secondary | ICD-10-CM | POA: Diagnosis present

## 2022-06-24 DIAGNOSIS — Z825 Family history of asthma and other chronic lower respiratory diseases: Secondary | ICD-10-CM

## 2022-06-24 DIAGNOSIS — K5900 Constipation, unspecified: Secondary | ICD-10-CM | POA: Diagnosis present

## 2022-06-24 DIAGNOSIS — I110 Hypertensive heart disease with heart failure: Principal | ICD-10-CM | POA: Diagnosis present

## 2022-06-24 DIAGNOSIS — G4733 Obstructive sleep apnea (adult) (pediatric): Secondary | ICD-10-CM | POA: Diagnosis present

## 2022-06-24 DIAGNOSIS — J45909 Unspecified asthma, uncomplicated: Secondary | ICD-10-CM | POA: Diagnosis present

## 2022-06-24 DIAGNOSIS — D649 Anemia, unspecified: Secondary | ICD-10-CM | POA: Diagnosis not present

## 2022-06-24 DIAGNOSIS — Z888 Allergy status to other drugs, medicaments and biological substances status: Secondary | ICD-10-CM

## 2022-06-24 DIAGNOSIS — G43909 Migraine, unspecified, not intractable, without status migrainosus: Secondary | ICD-10-CM | POA: Diagnosis present

## 2022-06-24 DIAGNOSIS — Z8051 Family history of malignant neoplasm of kidney: Secondary | ICD-10-CM | POA: Diagnosis not present

## 2022-06-24 DIAGNOSIS — Z83438 Family history of other disorder of lipoprotein metabolism and other lipidemia: Secondary | ICD-10-CM

## 2022-06-24 DIAGNOSIS — R1030 Lower abdominal pain, unspecified: Secondary | ICD-10-CM | POA: Diagnosis present

## 2022-06-24 DIAGNOSIS — Z79899 Other long term (current) drug therapy: Secondary | ICD-10-CM | POA: Diagnosis not present

## 2022-06-24 DIAGNOSIS — R600 Localized edema: Secondary | ICD-10-CM | POA: Diagnosis not present

## 2022-06-24 DIAGNOSIS — G4730 Sleep apnea, unspecified: Secondary | ICD-10-CM | POA: Diagnosis present

## 2022-06-24 DIAGNOSIS — E876 Hypokalemia: Secondary | ICD-10-CM | POA: Diagnosis not present

## 2022-06-24 DIAGNOSIS — E8770 Fluid overload, unspecified: Secondary | ICD-10-CM | POA: Diagnosis not present

## 2022-06-24 DIAGNOSIS — I10 Essential (primary) hypertension: Secondary | ICD-10-CM | POA: Diagnosis not present

## 2022-06-24 DIAGNOSIS — M7989 Other specified soft tissue disorders: Secondary | ICD-10-CM | POA: Diagnosis present

## 2022-06-24 DIAGNOSIS — I5031 Acute diastolic (congestive) heart failure: Secondary | ICD-10-CM | POA: Diagnosis present

## 2022-06-24 DIAGNOSIS — X58XXXA Exposure to other specified factors, initial encounter: Secondary | ICD-10-CM | POA: Diagnosis present

## 2022-06-24 DIAGNOSIS — F419 Anxiety disorder, unspecified: Secondary | ICD-10-CM | POA: Diagnosis present

## 2022-06-24 LAB — CBC WITH DIFFERENTIAL/PLATELET
Abs Immature Granulocytes: 0.01 10*3/uL (ref 0.00–0.07)
Basophils Absolute: 0 10*3/uL (ref 0.0–0.1)
Basophils Relative: 0 %
Eosinophils Absolute: 0.1 10*3/uL (ref 0.0–0.5)
Eosinophils Relative: 2 %
HCT: 26.6 % — ABNORMAL LOW (ref 36.0–46.0)
Hemoglobin: 8.5 g/dL — ABNORMAL LOW (ref 12.0–15.0)
Immature Granulocytes: 0 %
Lymphocytes Relative: 29 %
Lymphs Abs: 1.3 10*3/uL (ref 0.7–4.0)
MCH: 31.7 pg (ref 26.0–34.0)
MCHC: 32 g/dL (ref 30.0–36.0)
MCV: 99.3 fL (ref 80.0–100.0)
Monocytes Absolute: 0.3 10*3/uL (ref 0.1–1.0)
Monocytes Relative: 6 %
Neutro Abs: 2.7 10*3/uL (ref 1.7–7.7)
Neutrophils Relative %: 63 %
Platelets: 245 10*3/uL (ref 150–400)
RBC: 2.68 MIL/uL — ABNORMAL LOW (ref 3.87–5.11)
RDW: 14.8 % (ref 11.5–15.5)
WBC: 4.4 10*3/uL (ref 4.0–10.5)
nRBC: 0 % (ref 0.0–0.2)

## 2022-06-24 LAB — BASIC METABOLIC PANEL
Anion gap: 10 (ref 5–15)
BUN: 24 mg/dL — ABNORMAL HIGH (ref 6–20)
CO2: 25 mmol/L (ref 22–32)
Calcium: 9.3 mg/dL (ref 8.9–10.3)
Chloride: 105 mmol/L (ref 98–111)
Creatinine, Ser: 0.98 mg/dL (ref 0.44–1.00)
GFR, Estimated: >60 mL/min (ref >60)
Glucose, Bld: 103 mg/dL — ABNORMAL HIGH (ref 70–99)
Potassium: 4 mmol/L (ref 3.5–5.1)
Sodium: 140 mmol/L (ref 135–145)

## 2022-06-24 LAB — BRAIN NATRIURETIC PEPTIDE: B Natriuretic Peptide: 247.5 pg/mL — ABNORMAL HIGH (ref 0.0–100.0)

## 2022-06-24 LAB — TROPONIN I (HIGH SENSITIVITY)
Troponin I (High Sensitivity): 4 ng/L (ref <18)
Troponin I (High Sensitivity): 5 ng/L (ref <18)

## 2022-06-24 MED ORDER — OXYCODONE-ACETAMINOPHEN 5-325 MG PO TABS
1.0000 | ORAL_TABLET | Freq: Four times a day (QID) | ORAL | Status: DC | PRN
  Administered 2022-06-25 – 2022-06-27 (×6): 1 via ORAL
  Filled 2022-06-24 (×6): qty 1

## 2022-06-24 MED ORDER — TOPIRAMATE 25 MG PO TABS
50.0000 mg | ORAL_TABLET | Freq: Two times a day (BID) | ORAL | Status: DC
  Administered 2022-06-24 – 2022-06-27 (×6): 50 mg via ORAL
  Filled 2022-06-24 (×6): qty 2

## 2022-06-24 MED ORDER — ONDANSETRON HCL 4 MG/2ML IJ SOLN: 4.0000 mg | Freq: Four times a day (QID) | INTRAMUSCULAR | Status: DC | PRN

## 2022-06-24 MED ORDER — METHYLNALTREXONE BROMIDE 150 MG PO TABS: 450.0000 mg | ORAL_TABLET | Freq: Every morning | ORAL | Status: DC

## 2022-06-24 MED ORDER — SODIUM CHLORIDE 0.9% FLUSH: 3.0000 mL | INTRAVENOUS | Status: DC | PRN

## 2022-06-24 MED ORDER — SODIUM CHLORIDE 0.9 % IV SOLN: 250.0000 mL | INTRAVENOUS | Status: DC | PRN

## 2022-06-24 MED ORDER — FUROSEMIDE 10 MG/ML IJ SOLN
40.0000 mg | Freq: Two times a day (BID) | INTRAMUSCULAR | Status: DC
  Administered 2022-06-24 – 2022-06-26 (×4): 40 mg via INTRAVENOUS
  Filled 2022-06-24 (×4): qty 4

## 2022-06-24 MED ORDER — LAMOTRIGINE 25 MG PO TABS
100.0000 mg | ORAL_TABLET | Freq: Every day | ORAL | Status: DC
  Administered 2022-06-25 – 2022-06-27 (×3): 100 mg via ORAL
  Filled 2022-06-24 (×3): qty 4

## 2022-06-24 MED ORDER — ADULT MULTIVITAMIN W/MINERALS CH
1.0000 | ORAL_TABLET | Freq: Every day | ORAL | Status: DC
  Administered 2022-06-25 – 2022-06-27 (×3): 1 via ORAL
  Filled 2022-06-24 (×3): qty 1

## 2022-06-24 MED ORDER — ESCITALOPRAM OXALATE 10 MG PO TABS
10.0000 mg | ORAL_TABLET | Freq: Every day | ORAL | Status: DC
  Administered 2022-06-25 – 2022-06-27 (×3): 10 mg via ORAL
  Filled 2022-06-24 (×3): qty 1

## 2022-06-24 MED ORDER — TRAZODONE HCL 50 MG PO TABS
200.0000 mg | ORAL_TABLET | Freq: Every evening | ORAL | Status: DC | PRN
  Administered 2022-06-24 – 2022-06-26 (×2): 200 mg via ORAL
  Filled 2022-06-24 (×2): qty 4

## 2022-06-24 MED ORDER — ACETAMINOPHEN 325 MG PO TABS
650.0000 mg | ORAL_TABLET | Freq: Four times a day (QID) | ORAL | Status: DC | PRN
  Administered 2022-06-24 – 2022-06-26 (×2): 650 mg via ORAL
  Filled 2022-06-24 (×2): qty 2

## 2022-06-24 MED ORDER — SODIUM CHLORIDE 0.9% FLUSH
3.0000 mL | Freq: Two times a day (BID) | INTRAVENOUS | Status: DC
  Administered 2022-06-24 – 2022-06-27 (×6): 3 mL via INTRAVENOUS

## 2022-06-24 MED ORDER — IOHEXOL 350 MG/ML SOLN
75.0000 mL | Freq: Once | INTRAVENOUS | Status: AC | PRN
  Administered 2022-06-24: 75 mL via INTRAVENOUS

## 2022-06-24 MED ORDER — ACETAMINOPHEN 650 MG RE SUPP: 650.0000 mg | Freq: Four times a day (QID) | RECTAL | Status: DC | PRN

## 2022-06-24 MED ORDER — FUROSEMIDE 10 MG/ML IJ SOLN: 40.0000 mg | Freq: Once | INTRAMUSCULAR | Status: DC

## 2022-06-24 MED ORDER — IPRATROPIUM-ALBUTEROL 0.5-2.5 (3) MG/3ML IN SOLN
3.0000 mL | Freq: Once | RESPIRATORY_TRACT | Status: AC
  Administered 2022-06-24: 3 mL via RESPIRATORY_TRACT
  Filled 2022-06-24: qty 3

## 2022-06-24 MED ORDER — PROMETHAZINE HCL 25 MG PO TABS: 12.5000 mg | ORAL_TABLET | Freq: Four times a day (QID) | ORAL | Status: DC | PRN

## 2022-06-24 MED ORDER — ENOXAPARIN SODIUM 60 MG/0.6ML IJ SOSY
60.0000 mg | PREFILLED_SYRINGE | INTRAMUSCULAR | Status: DC
  Administered 2022-06-24 – 2022-06-26 (×3): 60 mg via SUBCUTANEOUS
  Filled 2022-06-24 (×3): qty 0.6

## 2022-06-24 MED ORDER — OXYCODONE HCL 5 MG PO TABS
5.0000 mg | ORAL_TABLET | Freq: Four times a day (QID) | ORAL | Status: DC | PRN
  Administered 2022-06-24 – 2022-06-26 (×7): 5 mg via ORAL
  Filled 2022-06-24 (×8): qty 1

## 2022-06-24 MED ORDER — FESOTERODINE FUMARATE ER 4 MG PO TB24
4.0000 mg | ORAL_TABLET | Freq: Every day | ORAL | Status: DC
  Administered 2022-06-25 – 2022-06-27 (×3): 4 mg via ORAL
  Filled 2022-06-24 (×3): qty 1

## 2022-06-24 MED ORDER — DULOXETINE HCL 20 MG PO CPEP
20.0000 mg | ORAL_CAPSULE | Freq: Every day | ORAL | Status: DC
  Administered 2022-06-25 – 2022-06-27 (×3): 20 mg via ORAL
  Filled 2022-06-24 (×3): qty 1

## 2022-06-24 MED ORDER — PREGABALIN 100 MG PO CAPS
100.0000 mg | ORAL_CAPSULE | Freq: Three times a day (TID) | ORAL | Status: DC
  Administered 2022-06-24 – 2022-06-27 (×9): 100 mg via ORAL
  Filled 2022-06-24 (×9): qty 1

## 2022-06-24 MED ORDER — FERROUS SULFATE 325 (65 FE) MG PO TABS
325.0000 mg | ORAL_TABLET | Freq: Every day | ORAL | Status: DC
  Administered 2022-06-24 – 2022-06-26 (×3): 325 mg via ORAL
  Filled 2022-06-24 (×3): qty 1

## 2022-06-24 MED ORDER — HALOPERIDOL 5 MG PO TABS
5.0000 mg | ORAL_TABLET | Freq: Every day | ORAL | Status: DC
  Administered 2022-06-24 – 2022-06-26 (×3): 5 mg via ORAL
  Filled 2022-06-24 (×4): qty 1

## 2022-06-24 MED ORDER — ALBUTEROL SULFATE (2.5 MG/3ML) 0.083% IN NEBU: 3.0000 mL | INHALATION_SOLUTION | Freq: Four times a day (QID) | RESPIRATORY_TRACT | Status: DC | PRN

## 2022-06-24 MED ORDER — OXYCODONE-ACETAMINOPHEN 10-325 MG PO TABS: 1.0000 | ORAL_TABLET | Freq: Four times a day (QID) | ORAL | Status: DC | PRN

## 2022-06-24 MED ORDER — LOSARTAN POTASSIUM 50 MG PO TABS
25.0000 mg | ORAL_TABLET | Freq: Every day | ORAL | Status: DC
  Administered 2022-06-25: 25 mg via ORAL
  Filled 2022-06-24: qty 1

## 2022-06-24 MED ORDER — ONDANSETRON HCL 4 MG PO TABS: 4.0000 mg | ORAL_TABLET | Freq: Four times a day (QID) | ORAL | Status: DC | PRN

## 2022-06-24 MED ORDER — PANTOPRAZOLE SODIUM 40 MG PO TBEC
40.0000 mg | DELAYED_RELEASE_TABLET | Freq: Two times a day (BID) | ORAL | Status: DC
  Administered 2022-06-24 – 2022-06-27 (×6): 40 mg via ORAL
  Filled 2022-06-24 (×6): qty 1

## 2022-06-24 MED ORDER — BUSPIRONE HCL 10 MG PO TABS
15.0000 mg | ORAL_TABLET | Freq: Three times a day (TID) | ORAL | Status: DC
  Administered 2022-06-24 – 2022-06-27 (×9): 15 mg via ORAL
  Filled 2022-06-24 (×10): qty 2

## 2022-06-24 NOTE — Progress Notes (Signed)
VASCULAR LAB    Left lower extremity venous duplex has been performed.  See CV proc for preliminary results.   Latresa Gasser, RVT 06/24/2022, 2:52 PM

## 2022-06-24 NOTE — H&P (Signed)
[facility] History and Physical  [name] [name] [id number] DOB: 08-03-1965 DOA: 06/24/2022  Referring physician: ED  PCP: [name], MD   Patient is coming from: Home   Chief Complaint: Shortness of breath, groin pain  HPI:   Patient is a 57 years old female with past medical history of asthma, bipolar disorder, hypertension, prediabetes, sleep apnea presents to the hospital with shortness of breath leg swelling and worsening fatigue.  Patient had been seen by her primary care provider and was on diuretic for leg swelling but despite diuretics had increasing swelling to the point that she was unable to ambulate and had little urinary output..  Patient also complained of chronic back pain with some pain over the left groin.  Patient complains of shortness of breath on minimal exertion and also has orthopnea.  He had at least 30 pound weight gain recently.  Patient denies any chest pain, fever, chills or rigor.  Denies any urinary urgency, frequency or dysuria.  Denies any changes in her bowel.  No sick contacts or recent travel.  Patient did have a prolonged stay in the hospital recently between 2-2/24 to 2/ 27/24 for right foot amputation and encephalopathy.  On this presentation in the ED, patient had elevated blood pressure with bilateral pitting edema.  BNP was elevated at 247 with hemoglobin of 8.5.  EKG showed normal sinus rhythm.  Chest x-ray showed minimal bibasilar edema/atelectasis with pleural effusion.  Patient received albuterol nebulizer and IV Lasix in the ED and was considered for admission to the hospital for congestive heart failure failed outpatient treatment..   Assessment and Plan  Acute congestive heart failure likely diastolic.  Worsening dyspnea paroxysmal nocturnal dyspnea and crackles on exam with peripheral pitting edema.   Failed outpatient treatment with diuretics.  Elevated BNP.  Continue IV Lasix 40 mg IV twice daily for now.  Continue CHF  protocol with strict intake and output charting Daily weights.  Low-salt diet and fluid restriction. CTA PE protocol has been ordered from the ED. currently on supplemental oxygen.  Will continue to wean as able.  Will check 2D echocardiogram.  Last 2D echocardiogram was 2 years back with preserved LV function.  Dyspnea shortness of breath with leg swelling and left leg pain..  On albuterol at home.  Will continue while in the hospital.  Ultrasound of the lower extremity was negative for DVT.  Ambulatory dysfunction due to volume overload.  Will continue IV diuresis.  Will get PT OT evaluation.  Recent AKI.  Patient was on meloxicam and spironolactone which was discontinued.  Creatinine on presentation 0.9.  Will continue to monitor while on diuretics.  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  Have a recent prolonged hospitalization secondary to the this problem.  History of sleep apnea Patient recently had sleep study done but is yet to receive her CPAP machine.  Will put the patient on auto titrating CPAP while in the hospital.  Hypertension as per history. Elevated on presentation.  Will continue with diuresis.  Not on antihypertensives as per the med rec.  Will need to start while in the hospital.  Will hold off with the beta-blocker for now due to decompensated heart failure.  Start on low-dose ARB.  History of bipolar disorder.  On Cymbalta, buspirone, Haldol, Lamictal and Lyrica, trazodone.  Will resume while in the hospital.  DVT Prophylaxis: Lovenox subcu  Review of Systems:  All systems were reviewed and were negative unless otherwise  mentioned in the HPI   Past Medical History:  Diagnosis Date   Anxiety    Arthritis    Phreesia 02/08/2020   Asthma    mild intermittent   Asthma    Phreesia 02/08/2020   Bipolar 1 disorder ([place])    ect treatments last treatment Sep 02 1011   Depression    Depression    Phreesia 02/08/2020    Depression    Phreesia 07/10/2020   GERD (gastroesophageal reflux disease)    Headache    Hypertension    Pre-diabetes    Seizures ([place])    Thinks it was from taking Tramadol   Sleep apnea    wears CPAP, uncertain of setting   Substance abuse ([place])    Phreesia 02/08/2020   Past Surgical History:  Procedure Laterality Date   ABDOMINAL HYSTERECTOMY     AMPUTATION TOE Left 02/02/2018   Procedure: AMPUTATION TOE Left 4th toe;  Surgeon: [name], DPM;  Location: [place];  Service: Podiatry;  Laterality: Left;   APPENDECTOMY N/A    Phreesia 02/08/2020   BACK SURGERY  2018   ACDF   C5-6 & C6-7 by Dr. Rennis Harding   CARPAL TUNNEL RELEASE     x2   I & D EXTREMITY Right 05/22/2022   Procedure: IRRIGATION AND DEBRIDEMENT EXTREMITY;  Surgeon: Felipa Furnace, DPM;  Location: [facility];  Service: Podiatry;  Laterality: Right;   INCISION AND DRAINAGE Right 05/24/2022   Procedure: RIGHT REVISION INCISION AND DRAINAGE/ WASHOUT WITH PRIMARY DELAYED CLOSURE;  Surgeon: [name], DPM;  Location: [facility];  Service: Podiatry;  Laterality: Right;   IRRIGATION AND DEBRIDEMENT ABSCESS Right 10/25/2020   Procedure: IRRIGATION AND DEBRIDEMENT ABSCESS OF FOOT AND APPLICATION OF GRAFT;  Surgeon: [name], DPM;  Location: [place];  Service: Podiatry;  Laterality: Right;   Laproscopic knee surgery Right    NECK SURGERY  03/15/2017   ACDF   by Dr. Patrice Paradise   PLANTAR FASCIA RELEASE     x2   REVERSE SHOULDER ARTHROPLASTY Right 04/14/2022   Procedure: REVERSE SHOULDER ARTHROPLASTY;  Surgeon: Hiram Gash, MD;  Location: [facility];  Service: Orthopedics;  Laterality: Right;   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   Procedure: TRANSMETATARSAL AMPUTATION;  Surgeon: [name], DPM;  Location: [facility];  Service: Podiatry;  Laterality: Right;   TRANSMETATARSAL AMPUTATION Right 05/27/2022   Procedure: REVISION TRANSMETATARSAL AMPUTATION WITH DELAYED PRIMARY CLOSURE;  Surgeon: [name],  DPM;  Location: [facility];  Service: Podiatry;  Laterality: Right;    Social History:  reports that she has never smoked. She has never used smokeless tobacco. She reports that she does not drink alcohol and does not use drugs.  Allergies  Allergen Reactions   Other    Tetracyclines & Related Other (See Comments)    Syncope and put her "in a coma"   Tramadol Other (See Comments)    Seizures   Phenazopyridine Other (See Comments)    Unknown   Tetracycline Other (See Comments)    Syncope and "Put me in a coma"   Ciprofloxacin Rash and Itching   Codeine Itching and Rash   Estradiol Rash    Patches broke out the skin    Family History  Problem Relation Age of Onset   Diabetes Mother    COPD Mother    Hypertension Mother    Hyperlipidemia Mother    Sleep apnea Mother    Heart disease Father  Hyperlipidemia Father    Hypertension Father    Cancer Father    Sleep apnea Father    Heart disease Brother    Sleep apnea Brother    Heart disease Maternal Grandmother    Hypertension Maternal Grandmother    Sleep apnea Maternal Grandmother    Heart disease Maternal Grandfather    Kidney cancer Paternal Grandmother    Heart disease Paternal Grandfather    Heart disease Daughter      Prior to Admission medications   Medication Sig Start Date End Date Taking? Authorizing Provider  albuterol (PROAIR HFA) 108 (90 Base) MCG/ACT inhaler Inhale 2 puffs = 119mg into the lungs every 6 (six) hours as needed for wheezing or shortness of breath. 07/11/20   MEulogio Bear NP  busPIRone (BUSPAR) 15 MG tablet Take 15 mg by mouth 3 (three) times daily.  04/26/17   [provider]  Calcium Carbonate-Vit D-Min (CALCIUM 1200 PO) Take 1,200 mg by mouth daily.     [provider]  DULoxetine (CYMBALTA) 20 MG capsule Take 1 capsule (20 mg total) by mouth daily. 06/15/22 07/15/22  SSidney Ace MD  Erenumab-aooe (AIMOVIG) 70 MG/ML SOAJ administer 164mUNDER THE SKIN every 30  DAYS 02/08/22   PiSusy FrizzleMD  escitalopram (LEXAPRO) 10 MG tablet Take 10 mg by mouth daily. 08/23/19   [provider]  estradiol (ESTRACE) 1 MG tablet Take 1 tablet (1 mg total) by mouth at bedtime. 07/27/21   PiSusy FrizzleMD  famotidine (PEPCID) 40 MG tablet TAKE ONE TABLET BY MOUTH EVERYDAY AT BEDTIME 04/20/22   PiSusy FrizzleMD  furosemide (LASIX) 40 MG tablet Take 1 tablet (40 mg total) by mouth daily. 06/18/22   PiSusy FrizzleMD  haloperidol (HALDOL) 5 MG tablet Take 1 tablet (5 mg total) by mouth at bedtime. For psychosis 11/15/15   MaBarton DuboisMD  Iron, Ferrous Sulfate, 325 (65 Fe) MG TABS Take 325 mg by mouth in the morning and at bedtime. 06/22/22   PiSusy FrizzleMD  lamoTRIgine (LAMICTAL) 100 MG tablet Take 100 mg by mouth daily. 08/06/21   [provider]  Multiple Vitamins-Minerals (MULTIVITAMIN WITH MINERALS) tablet Take 1 tablet by mouth daily.    [provider]  naloxone ([facility] Hospital[facility] spray 4 mg/0.1 mL Place 0.4 mg into the nose once. 08/03/21   [provider]  oxyCODONE-acetaminophen (PERCOCET) 10-325 MG tablet Take 1 tablet by mouth 4 (four) times daily as needed for pain.    [provider]  pantoprazole (PROTONIX) 40 MG tablet Take 1 tablet (40 mg total) by mouth 2 (two) times daily. 02/02/22   PiSusy FrizzleMD  pregabalin (LYRICA) 100 MG capsule Take 1 capsule (100 mg total) by mouth 2 (two) times daily. Patient taking differently: Take 100 mg by mouth 3 (three) times daily. 07/27/21   PiSusy FrizzleMD  promethazine (PHENERGAN) 12.5 MG tablet Take 1 tablet (12.5 mg total) by mouth every 6 (six) hours as needed for nausea or vomiting. 06/15/22   Sreenath, SuTrula SladeMD  RELISTOR 150 MG TABS Take 450 mg by mouth every morning. 02/04/22   [provider]  solifenacin (VESICARE) 10 MG tablet TAKE ONE TABLET BY MOUTH DAILY 04/26/22   PiSusy FrizzleMD  topiramate (TOPAMAX) 50 MG tablet TAKE ONE  TABLET BY MOUTH TWICE DAILY 04/20/22   PiSusy FrizzleMD  traZODone (DESYREL) 100 MG tablet Take 200 mg by mouth at  bedtime as needed for sleep. 07/29/14   [provider]    Physical Exam: Vitals:   06/24/22 1203 06/24/22 1204 06/24/22 1252 06/24/22 1545  BP:  (!) 173/94 (!) 172/96 (!) 178/96  Pulse: 81  72 92  Resp: 18  15 (!) 23  Temp: 98.4 F (36.9 C)  98.4 F (36.9 C)   TempSrc: Oral  Oral   SpO2: 94%  100% 100%  Weight:  119.3 kg    Height:  '5\' 6"'$  (1.676 m)     Wt Readings from Last 3 Encounters:  06/24/22 119.3 kg  06/21/22 119.4 kg  06/18/22 121.1 kg   Body mass index is 42.45 kg/m.  General: Obese built, not in obvious distress on nasal cannula oxygen HENT: Normocephalic, No scleral pallor or icterus noted. Oral mucosa is moist.  Chest:  Clear breath sounds.   bilateral basal crackles noted. CVS: S1 &S2 heard. No murmur.  Regular rate and rhythm. Abdomen: Soft, nontender, obese abdomen, mild distention, bowel sounds are heard. No abdominal mass palpated Extremities: No cyanosis, clubbing, bilateral pitting edema noted on the left lower extremity.  Boot on the right lower extremity.  Peripheral pulses are palpable. Psych: Alert, awake and oriented, normal mood CNS:  No cranial nerve deficits.  Power equal in all extremities.   Skin: Warm and dry.  No rashes noted.  Labs on Admission:   CBC: Recent Labs  Lab 06/18/22 1641 06/21/22 1132 06/24/22 1219  WBC 6.7 4.7 4.4  NEUTROABS 4,201 3,224 2.7  HGB 8.3* 7.7* 8.5*  HCT 25.1* 24.3* 26.6*  MCV 95.4 97.6 99.3  PLT 265 226 [id number]    Basic Metabolic Panel: Recent Labs  Lab 06/18/22 1641 06/21/22 1132 06/24/22 1219  NA 139 142 140  K 5.2 4.5 4.0  CL 109 110 105  CO2 '22 21 25  '$ GLUCOSE 82 110* 103*  BUN 30* 28* 24*  CREATININE 1.05* 1.08* 0.98  CALCIUM 8.5* 8.7 9.3    Liver Function Tests: No results for input(s): "AST", "ALT", "ALKPHOS", "BILITOT", "PROT", "ALBUMIN" in the last 168 hours. No  results for input(s): "LIPASE", "AMYLASE" in the last 168 hours. No results for input(s): "AMMONIA" in the last 168 hours.  Cardiac Enzymes: No results for input(s): "CKTOTAL", "CKMB", "CKMBINDEX", "TROPONINI" in the last 168 hours.  BNP (last 3 results) Recent Labs    06/18/22 1706 06/24/22 1219  BNP 259* 247.5*    ProBNP (last 3 results) No results for input(s): "PROBNP" in the last 8760 hours.  CBG: No results for input(s): "GLUCAP" in the last 168 hours.   Radiological Exams on Admission: DG Chest 2 View  Result Date: 06/24/2022 CLINICAL DATA:  Shortness of breath. EXAM: CHEST - 2 VIEW COMPARISON:  May 22, 2022. FINDINGS: Stable cardiomediastinal silhouette. Status post surgical posterior fusion of thoracic spine. Minimal bibasilar subsegmental atelectasis or edema is noted with small pleural effusions. IMPRESSION: Minimal bibasilar subsegmental atelectasis or edema with small pleural effusions. Electronically Signed   By: Marijo Conception M.D.   On: 06/24/2022 12:34    EKG: Personally reviewed by me which shows normal sinus rhythm.   Consultant: None  Code Status: None  Microbiology none  Antibiotics: None  Family Communication:  Patients' condition and plan of care including tests being ordered have been discussed with the patient and  who indicate understanding and agree with the plan.   Status is: Inpatient Remains inpatient appropriate because: Acute congestive heart failure exacerbation, failed outpatient treatment   Severity of  Illness: The appropriate patient status for this patient is INPATIENT. Inpatient status is judged to be reasonable and necessary in order to provide the required intensity of service to ensure the patient's safety. The patient's presenting symptoms, physical exam findings, and initial radiographic and laboratory data in the context of their chronic comorbidities is felt to place them at high risk for further clinical deterioration.  Furthermore, it is not anticipated that the patient will be medically stable for discharge from the hospital within 2 midnights of admission. I certify that at the point of admission it is my clinical judgment that the patient will require inpatient hospital care spanning beyond 2 midnights from the point of admission due to high intensity of service, high risk for further deterioration and high frequency of surveillance required.*  Signed, Flora Lipps, MD [facility] 06/24/2022

## 2022-06-24 NOTE — ED Notes (Signed)
Patient transported to CT 

## 2022-06-24 NOTE — ED Notes (Signed)
ED TO INPATIENT HANDOFF REPORT  ED Nurse Name and Phone #: Ticara Waner, RN [phone]  S Name/Age/Gender [name] [name] 57 y.o. female Room/Bed: 016C/016C  Code Status   Code Status: Full Code  Home/SNF/Other Home Patient oriented to: self, place, time, and situation Is this baseline? Yes   Triage Complete: Triage complete  Chief Complaint CHF (congestive heart failure) ([place]) [I50.9]  Triage Note Pt from home with ems for SOB for the past 2 weeks with leg swelling. Pt also c.o left groin pain that started today. Denies n/v. Pt sent here by PCP to rule out a blood clot. Pt on 2L [place] for comfort   Allergies Allergies  Allergen Reactions   Other    Tetracyclines & Related Other (See Comments)    Syncope and put her "in a coma"   Tramadol Other (See Comments)    Seizures   Phenazopyridine Other (See Comments)    Unknown   Tetracycline Other (See Comments)    Syncope and "Put me in a coma"   Ciprofloxacin Rash and Itching   Codeine Itching and Rash   Estradiol Rash    Patches broke out the skin    Level of Care/Admitting Diagnosis ED Disposition     ED Disposition  Admit   Condition  --   [place]: [facility] [[id number]]  Level of Care: Telemetry Cardiac [[id number]]  May admit patient to [facility] or [facility] if equivalent level of care is available:: No  Covid Evaluation: Asymptomatic - no recent exposure (last 10 days) testing not required  Diagnosis: CHF (congestive heart failure) [facility]) [id number]  Admitting Physician: Flora Lipps [id number]  Attending Physician: Flora Lipps [id number]  Certification:: I certify this patient will need inpatient services for at least 2 midnights  Estimated Length of Stay: 2          B Medical/Surgery History Past Medical History:  Diagnosis Date   Anxiety    Arthritis    Phreesia 02/08/2020   Asthma    mild intermittent   Asthma    Phreesia 02/08/2020   Bipolar 1 disorder ([place])    ect  treatments last treatment Sep 02 1011   Depression    Depression    Phreesia 02/08/2020   Depression    Phreesia 07/10/2020   GERD (gastroesophageal reflux disease)    Headache    Hypertension    Pre-diabetes    Seizures ([place])    Thinks it was from taking Tramadol   Sleep apnea    wears CPAP, uncertain of setting   Substance abuse ([place])    Phreesia 02/08/2020   Past Surgical History:  Procedure Laterality Date   ABDOMINAL HYSTERECTOMY     AMPUTATION TOE Left 02/02/2018   Procedure: AMPUTATION TOE Left 4th toe;  Surgeon: [name], DPM;  Location: [facility];  Service: Podiatry;  Laterality: Left;   APPENDECTOMY N/A    Phreesia 02/08/2020   BACK SURGERY  2018   ACDF   C5-6 & C6-7 by Dr. Rennis Harding   CARPAL TUNNEL RELEASE     x2   I & D EXTREMITY Right 05/22/2022   Procedure: IRRIGATION AND DEBRIDEMENT EXTREMITY;  Surgeon: Felipa Furnace, DPM;  Location: [facility];  Service: Podiatry;  Laterality: Right;   INCISION AND DRAINAGE Right 05/24/2022   Procedure: RIGHT REVISION INCISION AND DRAINAGE/ WASHOUT WITH PRIMARY DELAYED CLOSURE;  Surgeon: [name], DPM;  Location: [facility];  Service: Podiatry;  Laterality: Right;  IRRIGATION AND DEBRIDEMENT ABSCESS Right 10/25/2020   Procedure: IRRIGATION AND DEBRIDEMENT ABSCESS OF FOOT AND APPLICATION OF GRAFT;  Surgeon: [name], DPM;  Location: [place] [facility];  Service: Podiatry;  Laterality: Right;   Laproscopic knee surgery Right    NECK SURGERY  03/15/2017   ACDF   by Dr. Patrice Paradise   PLANTAR FASCIA RELEASE     x2   REVERSE SHOULDER ARTHROPLASTY Right 04/14/2022   Procedure: REVERSE SHOULDER ARTHROPLASTY;  Surgeon: Hiram Gash, MD;  Location: [facility];  Service: Orthopedics;  Laterality: Right;   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   Procedure: TRANSMETATARSAL AMPUTATION;  Surgeon: [name], DPM;  Location: [facility];  Service: Podiatry;  Laterality: Right;   TRANSMETATARSAL AMPUTATION Right 05/27/2022   Procedure:  REVISION TRANSMETATARSAL AMPUTATION WITH DELAYED PRIMARY CLOSURE;  Surgeon: [name], DPM;  Location: [facility];  Service: Podiatry;  Laterality: Right;     A IV Location/Drains/Wounds Patient Lines/Drains/Airways Status     Active Line/Drains/Airways     Name Placement date Placement time Site Days   Peripheral IV 06/24/22 20 G Left Antecubital 06/24/22  1219  Antecubital  less than 1   Wound / Incision (Open or Dehisced) Anterior --  --  --  --            Intake/Output Last 24 hours No intake or output data in the 24 hours ending 06/24/22 1621  Labs/Imaging Results for orders placed or performed during the hospital encounter of 06/24/22 (from the past 48 hour(s))  Brain natriuretic peptide     Status: Abnormal   Collection Time: 06/24/22 12:19 PM  Result Value Ref Range   B Natriuretic Peptide 247.5 (H) 0.0 - 100.0 pg/mL    Comment: Performed at [facility], [place] [address] [place], [place] [address]  CBC with Differential     Status: Abnormal   Collection Time: 06/24/22 12:19 PM  Result Value Ref Range   WBC 4.4 4.0 - 10.5 K/uL   RBC 2.68 (L) 3.87 - 5.11 MIL/uL   Hemoglobin 8.5 (L) 12.0 - 15.0 g/dL   HCT 26.6 (L) 36.0 - 46.0 %   MCV 99.3 80.0 - 100.0 fL   MCH 31.7 26.0 - 34.0 pg   MCHC 32.0 30.0 - 36.0 g/dL   RDW 14.8 11.5 - 15.5 %   Platelets 245 150 - 400 K/uL   nRBC 0.0 0.0 - 0.2 %   Neutrophils Relative % 63 %   Neutro Abs 2.7 1.7 - 7.7 K/uL   Lymphocytes Relative 29 %   Lymphs Abs 1.3 0.7 - 4.0 K/uL   Monocytes Relative 6 %   Monocytes Absolute 0.3 0.1 - 1.0 K/uL   Eosinophils Relative 2 %   Eosinophils Absolute 0.1 0.0 - 0.5 K/uL   Basophils Relative 0 %   Basophils Absolute 0.0 0.0 - 0.1 K/uL   Immature Granulocytes 0 %   Abs Immature Granulocytes 0.01 0.00 - 0.07 K/uL    Comment: Performed at [facility] [address] [place], [place] [address]  Troponin I (High Sensitivity)     Status: None   Collection Time: 06/24/22 12:19 PM   Result Value Ref Range   Troponin I (High Sensitivity) 4 <18 ng/L    Comment: (NOTE) Elevated high sensitivity troponin I (hsTnI) values and significant  changes across serial measurements may suggest ACS but many other  chronic and acute conditions are known to elevate hsTnI results.  Refer to the "Links"  section for chest pain algorithms and additional  guidance. Performed at [facility], [place] [address] [place], [place] [id number]   Basic metabolic panel     Status: Abnormal   Collection Time: 06/24/22 12:19 PM  Result Value Ref Range   Sodium 140 135 - 145 mmol/L   Potassium 4.0 3.5 - 5.1 mmol/L   Chloride 105 98 - 111 mmol/L   CO2 25 22 - 32 mmol/L   Glucose, Bld 103 (H) 70 - 99 mg/dL    Comment: Glucose reference range applies only to samples taken after fasting for at least 8 hours.   BUN 24 (H) 6 - 20 mg/dL   Creatinine, Ser 0.98 0.44 - 1.00 mg/dL   Calcium 9.3 8.9 - 10.3 mg/dL   GFR, Estimated >60 >60 mL/min    Comment: (NOTE) Calculated using the CKD-EPI Creatinine Equation (2021)    Anion gap 10 5 - 15    Comment: Performed at [facility] [address], [place], [place] [address]   *Note: Due to a large number of results and/or encounters for the requested time period, some results have not been displayed. A complete set of results can be found in Results Review.   VAS [place] LOWER EXTREMITY VENOUS (DVT)  Result Date: 06/24/2022  Lower Venous DVT Study Patient Name:  [name] [name]  Date of Exam:   06/24/2022 Medical Rec #: [id number]         Accession #:    [id number] Date of Birth: [birth year]         Patient Gender: F Patient Age:   40 years Exam Location:  [facility] Procedure:      VAS [place] LOWER EXTREMITY VENOUS (DVT) Referring Phys: Davonna Belling --------------------------------------------------------------------------------  Indications: Pitting edema, and Pain.  Comparison Study: No prior study on file Performing Technologist: Sharion Dove  RVS  Examination Guidelines: A complete evaluation includes B-mode imaging, spectral Doppler, color Doppler, and power Doppler as needed of all accessible portions of each vessel. Bilateral testing is considered an integral part of a complete examination. Limited examinations for reoccurring indications may be performed as noted. The reflux portion of the exam is performed with the patient in reverse Trendelenburg.  +-----+---------------+---------+-----------+----------+--------------+ RIGHTCompressibilityPhasicitySpontaneityPropertiesThrombus Aging +-----+---------------+---------+-----------+----------+--------------+ CFV  Full           Yes      Yes                                 +-----+---------------+---------+-----------+----------+--------------+   +---------+---------------+---------+-----------+----------+---------------+ LEFT     CompressibilityPhasicitySpontaneityPropertiesThrombus Aging  +---------+---------------+---------+-----------+----------+---------------+ CFV      Full           Yes      Yes                                  +---------+---------------+---------+-----------+----------+---------------+ SFJ      Full                                                         +---------+---------------+---------+-----------+----------+---------------+ FV Prox  Full                                                         +---------+---------------+---------+-----------+----------+---------------+  FV Mid   Full           Yes      Yes                                  +---------+---------------+---------+-----------+----------+---------------+ FV DistalFull                                                         +---------+---------------+---------+-----------+----------+---------------+ PFV      Full                                                         +---------+---------------+---------+-----------+----------+---------------+ POP       Full           Yes      Yes                                  +---------+---------------+---------+-----------+----------+---------------+ PTV                                                   patent by color +---------+---------------+---------+-----------+----------+---------------+ PERO                                                  patent by color +---------+---------------+---------+-----------+----------+---------------+    Summary: RIGHT: - No evidence of common femoral vein obstruction.  LEFT: - There is no evidence of deep vein thrombosis in the lower extremity.  - No cystic structure found in the popliteal fossa.  *See table(s) above for measurements and observations. Electronically signed by Orlie Pollen on 06/24/2022 at 4:17:52 PM.    Final    DG Chest 2 View  Result Date: 06/24/2022 CLINICAL DATA:  Shortness of breath. EXAM: CHEST - 2 VIEW COMPARISON:  May 22, 2022. FINDINGS: Stable cardiomediastinal silhouette. Status post surgical posterior fusion of thoracic spine. Minimal bibasilar subsegmental atelectasis or edema is noted with small pleural effusions. IMPRESSION: Minimal bibasilar subsegmental atelectasis or edema with small pleural effusions. Electronically Signed   By: Marijo Conception M.D.   On: 06/24/2022 12:34    Pending Labs Unresulted Labs (From admission, onward)     Start     Ordered   06/25/22 0500  CBC  Tomorrow morning,   R        06/24/22 1604   06/25/22 [id number]  Basic metabolic panel  Tomorrow morning,   R        06/24/22 1604            Vitals/Pain Today's Vitals   06/24/22 1203 06/24/22 1204 06/24/22 1252 06/24/22 1545  BP:  (!) 173/94 (!) 172/96 (!) 178/96  Pulse: 81  72 92  Resp: 18  15 (!) 23  Temp: 98.4 F (36.9 C)  98.4 F (36.9 C)   TempSrc: Oral  Oral   SpO2: 94%  100% 100%  Weight:  119.3 kg    Height:  '5\' 6"'$  (1.676 m)    PainSc: 6        Isolation Precautions No active isolations  Medications Medications   furosemide (LASIX) injection 40 mg (has no administration in time range)  enoxaparin (LOVENOX) injection 40 mg (has no administration in time range)  furosemide (LASIX) injection 40 mg (has no administration in time range)  sodium chloride flush (NS) 0.9 % injection 3 mL (has no administration in time range)  sodium chloride flush (NS) 0.9 % injection 3 mL (has no administration in time range)  0.9 %  sodium chloride infusion (has no administration in time range)  acetaminophen (TYLENOL) tablet 650 mg (has no administration in time range)    Or  acetaminophen (TYLENOL) suppository 650 mg (has no administration in time range)  ondansetron (ZOFRAN) tablet 4 mg (has no administration in time range)    Or  ondansetron (ZOFRAN) injection 4 mg (has no administration in time range)  ipratropium-albuterol (DUONEB) 0.5-2.5 (3) MG/3ML nebulizer solution 3 mL (3 mLs Nebulization Given 06/24/22 1418)    Mobility Patient reports that she does not get up much because it's too difficult and she's too swollen     Focused Assessments     R Recommendations: See Admitting Provider Note  Report given to:   Additional Notes: Patient is A&Ox4, used bedside commode earlier, currently waiting on CT. Will administer lasix once the CT is complete, lab at bedside for troponin.

## 2022-06-24 NOTE — ED Provider Triage Note (Signed)
Emergency Medicine Provider Triage Evaluation Note  [name] [name] , a 57 y.o. female  was evaluated in triage.  Pt complains of dyspnea, fatigue.  Patient was hospitalized last month, notes that since discharge she has had persistent dyspnea.  She hypothesizes that with acute kidney injury she received fluids and today after seeing her physician she was in here for evaluation.  She notes that she feels better with oxygen.  She denies focal pain aside from some left-sided discomfort.  No fever, confusion, vomiting.  Review of Systems  Positive: HPI Negative: Fever, vomiting  Physical Exam  BP (!) 173/94   Pulse 81   Temp 98.4 F (36.9 C) (Oral)   Resp 18   Ht '5\' 6"'$  (1.676 m)   Wt 119.3 kg   SpO2 94%   BMI 42.45 kg/m  Gen:   Awake, no distress obese adult female nasal cannula in place Resp:  Normal effort tachypnea MSK:   Moves extremities without difficulty no deformity Other:  Unremarkable  Medical Decision Making  Medically screening exam initiated at 12:15 PM.  Appropriate orders placed.  KOEY GETHERS was informed that the remainder of the evaluation will be completed by another provider, this initial triage assessment does not replace that evaluation, and the importance of remaining in the ED until their evaluation is complete.  Labs x-ray ordered.   Carmin Muskrat, MD 06/24/22 [phone]

## 2022-06-24 NOTE — Telephone Encounter (Signed)
Noted, thanks!

## 2022-06-24 NOTE — Telephone Encounter (Signed)
Pt called back with increasing SOB and leg pain. Advised pt to go to ER immediately. Pt verbalized understanding of all. Mjp,lpn

## 2022-06-24 NOTE — Telephone Encounter (Signed)
Spoke with Tonya(nurse) giving physician's recommendations and approval for the dressing changes for the patient, verbalized understanding.

## 2022-06-24 NOTE — ED Provider Notes (Addendum)
[facility] Provider Note   CSN: [id number] Arrival date & time: 06/24/22  1159     History  Chief Complaint  Patient presents with   Shortness of Breath   Groin Pain    [name] [name] is a 57 y.o. female.   Shortness of Breath Groin Pain Associated symptoms include shortness of breath.  Patient states that shortness of breath leg swelling and worsening fatigue.  Sent by PCP.  Has been I just outpatient medicines due to kidney injury and edema.  Worsening swelling.  States she really cannot get out of bed now because of it. states she has chronic back pain that now feels that it is going to the groin.    Past Medical History:  Diagnosis Date   Anxiety    Arthritis    Phreesia 02/08/2020   Asthma    mild intermittent   Asthma    Phreesia 02/08/2020   Bipolar 1 disorder ([place])    ect treatments last treatment Sep 02 1011   Depression    Depression    Phreesia 02/08/2020   Depression    Phreesia 07/10/2020   GERD (gastroesophageal reflux disease)    Headache    Hypertension    Pre-diabetes    Seizures ([place])    Thinks it was from taking Tramadol   Sleep apnea    wears CPAP, uncertain of setting   Substance abuse ([place])    Phreesia 02/08/2020    Home Medications Prior to Admission medications   Medication Sig Start Date End Date Taking? Authorizing Provider  albuterol (PROAIR HFA) 108 (90 Base) MCG/ACT inhaler Inhale 2 puffs = 14mg into the lungs every 6 (six) hours as needed for wheezing or shortness of breath. 07/11/20   MEulogio Bear NP  busPIRone (BUSPAR) 15 MG tablet Take 15 mg by mouth 3 (three) times daily.  04/26/17   [provider]  Calcium Carbonate-Vit D-Min (CALCIUM 1200 PO) Take 1,200 mg by mouth daily.     [provider]  DULoxetine (CYMBALTA) 20 MG capsule Take 1 capsule (20 mg total) by mouth daily. 06/15/22 07/15/22  SSidney Ace MD  Erenumab-aooe (AIMOVIG) 70 MG/ML SOAJ  administer 168mUNDER THE SKIN every 30 DAYS 02/08/22   PiSusy FrizzleMD  escitalopram (LEXAPRO) 10 MG tablet Take 10 mg by mouth daily. 08/23/19   [provider]  estradiol (ESTRACE) 1 MG tablet Take 1 tablet (1 mg total) by mouth at bedtime. 07/27/21   PiSusy FrizzleMD  famotidine (PEPCID) 40 MG tablet TAKE ONE TABLET BY MOUTH EVERYDAY AT BEDTIME 04/20/22   PiSusy FrizzleMD  furosemide (LASIX) 40 MG tablet Take 1 tablet (40 mg total) by mouth daily. 06/18/22   PiSusy FrizzleMD  haloperidol (HALDOL) 5 MG tablet Take 1 tablet (5 mg total) by mouth at bedtime. For psychosis 11/15/15   MaBarton DuboisMD  Iron, Ferrous Sulfate, 325 (65 Fe) MG TABS Take 325 mg by mouth in the morning and at bedtime. 06/22/22   PiSusy FrizzleMD  lamoTRIgine (LAMICTAL) 100 MG tablet Take 100 mg by mouth daily. 08/06/21   [provider]  Multiple Vitamins-Minerals (MULTIVITAMIN WITH MINERALS) tablet Take 1 tablet by mouth daily.    [provider]  naloxone (NKindred Hospital-[facility] Florida-Coral Gablesnasal spray 4 mg/0.1 mL Place 0.4 mg into the nose once. 08/03/21   [provider]  oxyCODONE-acetaminophen (PERCOCET) 10-325 MG tablet Take 1 tablet by mouth  4 (four) times daily as needed for pain.    [provider]  pantoprazole (PROTONIX) 40 MG tablet Take 1 tablet (40 mg total) by mouth 2 (two) times daily. 02/02/22   [name], MD  pregabalin (LYRICA) 100 MG capsule Take 1 capsule (100 mg total) by mouth 2 (two) times daily. Patient taking differently: Take 100 mg by mouth 3 (three) times daily. 07/27/21   [name], MD  promethazine (PHENERGAN) 12.5 MG tablet Take 1 tablet (12.5 mg total) by mouth every 6 (six) hours as needed for nausea or vomiting. 06/15/22   Sreenath, [name], MD  RELISTOR 150 MG TABS Take 450 mg by mouth every morning. 02/04/22   [provider]  solifenacin (VESICARE) 10 MG tablet TAKE ONE TABLET BY MOUTH DAILY 04/26/22   [name], MD   topiramate (TOPAMAX) 50 MG tablet TAKE ONE TABLET BY MOUTH TWICE DAILY 04/20/22   [name], MD  traZODone (DESYREL) 100 MG tablet Take 200 mg by mouth at bedtime as needed for sleep. 07/29/14   [provider]      Allergies    Other, Tetracyclines & related, Tramadol, Phenazopyridine, Tetracycline, Ciprofloxacin, Codeine, and Estradiol    Review of Systems   Review of Systems  Respiratory:  Positive for shortness of breath.     Physical Exam Updated Vital Signs BP (!) 172/96 (BP Location: Right Arm)   Pulse 72   Temp 98.4 F (36.9 C) (Oral)   Resp 15   Ht '5\' 6"'$  (1.676 m)   Wt 119.3 kg   SpO2 100%   BMI 42.45 kg/m  Physical Exam Vitals and nursing note reviewed.  HENT:     Head: Atraumatic.  Cardiovascular:     Rate and Rhythm: Regular rhythm.  Pulmonary:     Comments: Diffuse sharp breath sounds. Chest:     Chest wall: No tenderness.  Abdominal:     Tenderness: There is no abdominal tenderness.  Musculoskeletal:     Right lower leg: Edema present.     Left lower leg: Edema present.     Comments: Pitting edema bilateral lower extremities.  Boot on right foot.  Skin:    General: Skin is warm.     Capillary Refill: Capillary refill takes less than 2 seconds.  Neurological:     Mental Status: She is alert.     ED Results / Procedures / Treatments   Labs (all labs ordered are listed, but only abnormal results are displayed) Labs Reviewed  BRAIN NATRIURETIC PEPTIDE - Abnormal; Notable for the following components:      Result Value   B Natriuretic Peptide 247.5 (*)    All other components within normal limits  CBC WITH DIFFERENTIAL/PLATELET - Abnormal; Notable for the following components:   RBC 2.68 (*)    Hemoglobin 8.5 (*)    HCT 26.6 (*)    All other components within normal limits  BASIC METABOLIC PANEL - Abnormal; Notable for the following components:   Glucose, Bld 103 (*)    BUN 24 (*)    All other components within normal limits   TROPONIN I (HIGH SENSITIVITY)  TROPONIN I (HIGH SENSITIVITY)    EKG EKG Interpretation  Date/Time:  Thursday June 24 2022 12:06:12 EST Ventricular Rate:  79 PR Interval:  184 QRS Duration: 82 QT Interval:  394 QTC Calculation: 451 R Axis:   46 Text Interpretation: Normal sinus rhythm Normal ECG When compared with ECG of 22-May-2022 00:00, No  significant change since last tracing Confirmed by Davonna Belling [phone]) on 06/24/2022 12:40:07 PM  Radiology DG Chest 2 View  Result Date: 06/24/2022 CLINICAL DATA:  Shortness of breath. EXAM: CHEST - 2 VIEW COMPARISON:  May 22, 2022. FINDINGS: Stable cardiomediastinal silhouette. Status post surgical posterior fusion of thoracic spine. Minimal bibasilar subsegmental atelectasis or edema is noted with small pleural effusions. IMPRESSION: Minimal bibasilar subsegmental atelectasis or edema with small pleural effusions. Electronically Signed   By: Marijo Conception M.D.   On: 06/24/2022 12:34    Procedures Procedures    Medications Ordered in ED Medications  ipratropium-albuterol (DUONEB) 0.5-2.5 (3) MG/3ML nebulizer solution 3 mL (3 mLs Nebulization Given 06/24/22 1418)    ED Course/ Medical Decision Making/ A&P                             Medical Decision Making Amount and/or Complexity of Data Reviewed Radiology: ordered.  Risk Prescription drug management.   Patient with shortness of breath abdominal pain and leg swelling.  Has failed outpatient management of diuretics.  BNP continues to be elevated.  States fatigued and cannot get around.  Swelling in the legs but negative Doppler.  Edema to the point that the probe would leave edema.  Creatinine mildly improved from prior.  BNP still at the same level.  Hemoglobin low but also I think chronic.  Similar to prior level.  Will discuss with hospitalist for admission.   Reviewed notes from PCP.  Note does not mention imaging a ordered but appears to have ordered stat CT PE  protocol.  Will order now.       Final Clinical Impression(s) / ED Diagnoses Final diagnoses:  Peripheral edema    Rx / DC Orders ED Discharge Orders     None         Davonna Belling, MD 06/24/22 [id number]    Davonna Belling, MD 06/24/22 1553

## 2022-06-24 NOTE — Chronic Care Management (AMB) (Signed)
   06/24/2022  [name] [name] 1965/05/07 [id number]   Enrollment status changed to previously enrolled  Jacqlyn Larsen [facility], BSN RN Case Manager [phone]

## 2022-06-24 NOTE — ED Triage Notes (Addendum)
Pt from home with ems for SOB for the past 2 weeks with leg swelling. Pt also c.o left groin pain that started today. Denies n/v. Pt sent here by PCP to rule out a blood clot. Pt on 2L [place] for comfort

## 2022-06-25 ENCOUNTER — Encounter: Payer: 59 | Admitting: Podiatry

## 2022-06-25 ENCOUNTER — Inpatient Hospital Stay (HOSPITAL_COMMUNITY): Payer: 59

## 2022-06-25 ENCOUNTER — Telehealth: Payer: Self-pay | Admitting: Podiatry

## 2022-06-25 ENCOUNTER — Inpatient Hospital Stay: Payer: 59 | Admitting: Family Medicine

## 2022-06-25 DIAGNOSIS — D649 Anemia, unspecified: Secondary | ICD-10-CM | POA: Insufficient documentation

## 2022-06-25 DIAGNOSIS — I509 Heart failure, unspecified: Secondary | ICD-10-CM

## 2022-06-25 DIAGNOSIS — I110 Hypertensive heart disease with heart failure: Secondary | ICD-10-CM

## 2022-06-25 LAB — BASIC METABOLIC PANEL
Anion gap: 5 (ref 5–15)
BUN: 17 mg/dL (ref 6–20)
CO2: 27 mmol/L (ref 22–32)
Calcium: 8.8 mg/dL — ABNORMAL LOW (ref 8.9–10.3)
Chloride: 108 mmol/L (ref 98–111)
Creatinine, Ser: 0.95 mg/dL (ref 0.44–1.00)
GFR, Estimated: >60 mL/min (ref >60)
Glucose, Bld: 94 mg/dL (ref 70–99)
Potassium: 3.6 mmol/L (ref 3.5–5.1)
Sodium: 140 mmol/L (ref 135–145)

## 2022-06-25 LAB — IRON AND TIBC
Iron: 87 ug/dL (ref 28–170)
Saturation Ratios: 27 % (ref 10.4–31.8)
TIBC: 328 ug/dL (ref 250–450)
UIBC: 241 ug/dL

## 2022-06-25 LAB — FOLATE: Folate: 24.4 ng/mL (ref >5.9)

## 2022-06-25 LAB — RETICULOCYTES
Immature Retic Fract: 8.4 % (ref 2.3–15.9)
RBC.: 2.76 MIL/uL — ABNORMAL LOW (ref 3.87–5.11)
Retic Count, Absolute: 57.4 10*3/uL (ref 19.0–186.0)
Retic Ct Pct: 2.1 % (ref 0.4–3.1)

## 2022-06-25 LAB — CBC
HCT: 23.3 % — ABNORMAL LOW (ref 36.0–46.0)
Hemoglobin: 7.5 g/dL — ABNORMAL LOW (ref 12.0–15.0)
MCH: 31.5 pg (ref 26.0–34.0)
MCHC: 32.2 g/dL (ref 30.0–36.0)
MCV: 97.9 fL (ref 80.0–100.0)
Platelets: 230 10*3/uL (ref 150–400)
RBC: 2.38 MIL/uL — ABNORMAL LOW (ref 3.87–5.11)
RDW: 14.8 % (ref 11.5–15.5)
WBC: 5.8 10*3/uL (ref 4.0–10.5)
nRBC: 0 % (ref 0.0–0.2)

## 2022-06-25 LAB — ECHOCARDIOGRAM COMPLETE
Area-P 1/2: 4.21 cm2
Height: 64 in
Weight: 3950.64 oz

## 2022-06-25 LAB — MRSA NEXT GEN BY PCR, NASAL: MRSA by PCR Next Gen: NOT DETECTED

## 2022-06-25 LAB — VITAMIN B12: Vitamin B-12: 575 pg/mL (ref 180–914)

## 2022-06-25 LAB — FERRITIN: Ferritin: 106 ng/mL (ref 11–307)

## 2022-06-25 MED ORDER — SPIRONOLACTONE 25 MG PO TABS
25.0000 mg | ORAL_TABLET | Freq: Every day | ORAL | Status: DC
  Administered 2022-06-25 – 2022-06-27 (×3): 25 mg via ORAL
  Filled 2022-06-25 (×3): qty 1

## 2022-06-25 MED ORDER — LOSARTAN POTASSIUM 50 MG PO TABS
50.0000 mg | ORAL_TABLET | Freq: Every day | ORAL | Status: DC
  Administered 2022-06-26 – 2022-06-27 (×2): 50 mg via ORAL
  Filled 2022-06-25 (×2): qty 1

## 2022-06-25 NOTE — Hospital Course (Signed)
Patient is a 57 years old female with past medical history of asthma, bipolar disorder, hypertension, prediabetes, sleep apnea presents to the hospital with shortness of breath, leg swelling and worsening fatigue with orthopnea and ambulatory dysfunction due to increasing edema.  Had 30 pound weight gain.  Was on Lasix as outpatient with her primary care physician but was not getting better so she decided to come to the hospital.  In the ED patient had bilateral gross pitting edema and BNP was elevated.  Chest x-ray showed some evidence of congestion.  Patient was then admitted hospital for congestive heart failure, failed outpatient treatment.  Assessment and Plan   Acute congestive heart failure likely diastolic.  Presented with worsening dyspnea paroxysmal nocturnal dyspnea and crackles on exam with peripheral pitting edema.   Failed outpatient treatment with diuretics.  Elevated BNP.  Continue IV Lasix 40 mg IV twice daily for now.  Continue CHF protocol with strict intake and output charting Daily weights.  Low-salt diet and fluid restriction. CTA PE protocol was ordered from the ED which showed no evidence of pulmonary embolism but small to moderate bilateral pleural effusion with pulmonary edema.  On supplemental oxygen.  Will check 2D echocardiogram.  Last 2D echocardiogram was 2 years back with preserved LV function.   Dyspnea shortness of breath with leg swelling and left leg pain..  On albuterol at home.  Thinly bronchodilators.  Ultrasound of the lower extremity was negative for DVT.   Ambulatory dysfunction due to volume overload.  Will continue IV diuresis.  Will get PT OT evaluation   Recent AKI.  Patient was on meloxicam and spironolactone which was discontinued.  Creatinine on presentation 0.9.  Will continue to monitor while on diuretics.  Creatinine at 0.9 today and is stable.   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  Had a recent  prolonged hospitalization secondary to the this problem.   History of sleep apnea Patient recently had sleep study done but is yet to receive her CPAP machine.  Will put the patient on auto titrating CPAP while in the hospital.   Hypertension as per history. Started on low-dose losartan.  Will hold off with the beta-blocker for now due to decompensated heart failure.  Start on low-dose ARB.   History of bipolar disorder.  Continue Cymbalta, buspirone, Haldol, Lamictal and Lyrica, trazodone.

## 2022-06-25 NOTE — Telephone Encounter (Signed)
Dr. Loel Lofty is on-call.  I sent him a message that she was readmitted.  He should be rounding on her this weekend.  Thanks, Dr. Amalia Hailey

## 2022-06-25 NOTE — Progress Notes (Addendum)
PROGRESS NOTE    ANAKA BAJAJ  [id number] DOB: 1965-11-03 DOA: 06/24/2022 PCP: [name], MD    Brief Narrative:  Patient is a 57 years old female with past medical history of asthma, bipolar disorder, hypertension, prediabetes, sleep apnea presents to the hospital with shortness of breath, leg swelling and worsening fatigue with orthopnea and ambulatory dysfunction due to increasing edema.  Had 30 pound weight gain.  Was on Lasix as outpatient with her primary care physician but was not getting better so she decided to come to the hospital.  In the ED patient had bilateral gross pitting edema and BNP was elevated.  Chest x-ray showed some evidence of congestion.  Patient was then admitted hospital for congestive heart failure, failed outpatient treatment.  Assessment and Plan   Acute congestive heart failure likely diastolic.  Presented with worsening dyspnea paroxysmal nocturnal dyspnea and crackles on exam with peripheral pitting edema.   Failed outpatient treatment with diuretics.  Elevated BNP.  Continue IV Lasix 40 mg IV twice daily for now.  Continue CHF protocol with strict intake and output charting Daily weights.  Low-salt diet and fluid restriction. CTA PE protocol was ordered from the ED which showed no evidence of pulmonary embolism but small to moderate bilateral pleural effusion with pulmonary edema.  On supplemental oxygen.  Will check 2D echocardiogram.  Last 2D echocardiogram was 2 years back with preserved LV function.   Dyspnea shortness of breath with leg swelling and left leg pain..  On albuterol at home.  Thinly bronchodilators.  Ultrasound of the lower extremity was negative for DVT.  Will consult cardiology since patient will benefit from further evaluation, outpatient follow-up and goal-directed medical treatment.   Ambulatory dysfunction due to volume overload.  Will continue IV diuresis.  Will get PT OT evaluation   Recent AKI.  Patient was on meloxicam  and spironolactone which was discontinued.  Creatinine on presentation 0.9.  Will continue to monitor while on diuretics.  Creatinine at 0.9 today and is stable.   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  Had a recent prolonged hospitalization secondary to the this problem.   History of sleep apnea Patient recently had sleep study done but is yet to receive her CPAP machine.  Will put the patient on auto titrating CPAP while in the hospital.   Hypertension as per history. Started on low-dose losartan.  Will hold off with the beta-blocker for now due to decompensated heart failure.  Start on low-dose ARB.   History of bipolar disorder.  Continue Cymbalta, buspirone, Haldol, Lamictal and Lyrica, trazodone.     History of migraine.  Patient take Aimovig monthly as outpatient including Topamax.  Has recently received Aimovig.  Anemia.  Patient denies any obvious blood loss.  Hemoglobin yesterday at 8.5.  Today at 7.5.  Will get FOBT, anemia panel.  Patient might benefit from iron transfusion if iron is low.   DVT prophylaxis: Lovenox    Code Status:     Code Status: Full Code  Disposition: Home with home health likely in 1 to 2 days.  Status is: Inpatient  Remains inpatient appropriate because: Decompensated heart failure.   Family Communication: None at bedside  Consultants:  Will consult cardiology.  Procedures:  None  Antimicrobials:  None  Anti-infectives (From admission, onward)    None       Subjective: Today, patient was seen and examined at bedside.  Patient feels a little better with breathing today.  Complains of headache and has migraine at home.  Has not been walking much.  Has been urinating quite a bit.  Objective: Vitals:   06/25/22 0014 06/25/22 0424 06/25/22 0431 06/25/22 0740  BP: (!) 144/86 (!) 157/91  (!) 187/100  Pulse: 75     Resp: 18   18  Temp: 98 F (36.7 C)   97.8 F (36.6 C)  TempSrc:  Oral   Oral  SpO2: 100% 100%  99%  Weight:   112 kg   Height:        Intake/Output Summary (Last 24 hours) at 06/25/2022 0956 Last data filed at 06/25/2022 0846 Gross per 24 hour  Intake 120 ml  Output 1700 ml  Net -1580 ml   Filed Weights   06/24/22 1204 06/25/22 0431  Weight: 119.3 kg 112 kg    Physical Examination: Body mass index is 42.38 kg/m.  General: Obese built, not in obvious distress, on nasal cannula oxygen HENT:   No scleral pallor or icterus noted. Oral mucosa is moist.  Chest:    Diminished breath sounds bilaterally.  CVS: S1 &S2 heard. No murmur.  Regular rate and rhythm. Abdomen: Soft, nontender, nondistended.  Bowel sounds are heard.   Extremities: No cyanosis, clubbing with pitting lower extremity edema.  Peripheral pulses are palpable. Psych: Alert, awake and oriented, normal mood CNS:  No cranial nerve deficits.  Power equal in all extremities.   Skin: Warm and dry.  No rashes noted.  Data Reviewed:   CBC: Recent Labs  Lab 06/18/22 1641 06/21/22 1132 06/24/22 1219 06/25/22 0053  WBC 6.7 4.7 4.4 5.8  NEUTROABS 4,201 3,224 2.7  --   HGB 8.3* 7.7* 8.5* 7.5*  HCT 25.1* 24.3* 26.6* 23.3*  MCV 95.4 97.6 99.3 97.9  PLT 265 226 245 [id number]    Basic Metabolic Panel: Recent Labs  Lab 06/18/22 1641 06/21/22 1132 06/24/22 1219 06/25/22 0053  NA 139 142 140 140  K 5.2 4.5 4.0 3.6  CL 109 110 105 108  CO2 '22 21 25 27  '$ GLUCOSE 82 110* 103* 94  BUN 30* 28* 24* 17  CREATININE 1.05* 1.08* 0.98 0.95  CALCIUM 8.5* 8.7 9.3 8.8*    Liver Function Tests: No results for input(s): "AST", "ALT", "ALKPHOS", "BILITOT", "PROT", "ALBUMIN" in the last 168 hours.   Radiology Studies: CT Angio Chest PE W and/or Wo Contrast  Result Date: 06/24/2022 CLINICAL DATA:  Pulmonary embolism (PE) suspected, high prob Shortness of breath. EXAM: CT ANGIOGRAPHY CHEST WITH CONTRAST TECHNIQUE: Multidetector CT imaging of the chest was performed using the standard protocol during  bolus administration of intravenous contrast. Multiplanar CT image reconstructions and MIPs were obtained to evaluate the vascular anatomy. RADIATION DOSE REDUCTION: This exam was performed according to the departmental dose-optimization program which includes automated exposure control, adjustment of the mA and/or kV according to patient size and/or use of iterative reconstruction technique. CONTRAST:  72m OMNIPAQUE IOHEXOL 350 MG/ML SOLN COMPARISON:  Radiograph earlier today. Chest CTA 09/23/2016 FINDINGS: Cardiovascular: Technically limited exam due to breathing motion, streak artifact from spinal and right arm hardware and soft tissue attenuation from habitus. Allowing for these limitations, there is no obvious pulmonary embolus. Normal caliber thoracic aorta without dissection. Heart is upper normal in size. No pericardial effusion. Mediastinum/Nodes: Scattered small mediastinal and bilateral hilar lymph nodes, not enlarged by size criteria. Minimal hiatal hernia. Lungs/Pleura: Small to moderate bilateral pleural effusions with adjacent compressive atelectasis. Smooth septal thickening and ill-defined ground-glass typical of pulmonary edema. No  pneumothorax. Upper Abdomen: No acute findings. Large volume of stool in the included colon. Musculoskeletal: Reverse right shoulder arthroplasty. Extensive posterior thoracolumbar fusion hardware. Anterior fusion hardware in the lower cervical spine. No acute findings. Review of the MIP images confirms the above findings. IMPRESSION: 1. No obvious pulmonary embolus allowing for limitations as described above. 2. Small to moderate bilateral pleural effusions with adjacent compressive atelectasis. Smooth septal thickening and ill-defined ground-glass typical of pulmonary edema. Electronically Signed   By: Keith Rake M.D.   On: 06/24/2022 17:53   VAS [place] LOWER EXTREMITY VENOUS (DVT)  Result Date: 06/24/2022  Lower Venous DVT Study Patient Name:  PAYSEN HAMILTON   Date of Exam:   06/24/2022 Medical Rec #: [id number]         Accession #:    [id number] Date of Birth: Jun 10, 1965         Patient Gender: F Patient Age:   32 years Exam Location:  [facility] Procedure:      VAS [place] LOWER EXTREMITY VENOUS (DVT) Referring Phys: Davonna Belling --------------------------------------------------------------------------------  Indications: Pitting edema, and Pain.  Comparison Study: No prior study on file Performing Technologist: Sharion Dove RVS  Examination Guidelines: A complete evaluation includes B-mode imaging, spectral Doppler, color Doppler, and power Doppler as needed of all accessible portions of each vessel. Bilateral testing is considered an integral part of a complete examination. Limited examinations for reoccurring indications may be performed as noted. The reflux portion of the exam is performed with the patient in reverse Trendelenburg.  +-----+---------------+---------+-----------+----------+--------------+ RIGHTCompressibilityPhasicitySpontaneityPropertiesThrombus Aging +-----+---------------+---------+-----------+----------+--------------+ CFV  Full           Yes      Yes                                 +-----+---------------+---------+-----------+----------+--------------+   +---------+---------------+---------+-----------+----------+---------------+ LEFT     CompressibilityPhasicitySpontaneityPropertiesThrombus Aging  +---------+---------------+---------+-----------+----------+---------------+ CFV      Full           Yes      Yes                                  +---------+---------------+---------+-----------+----------+---------------+ SFJ      Full                                                         +---------+---------------+---------+-----------+----------+---------------+ FV Prox  Full                                                          +---------+---------------+---------+-----------+----------+---------------+ FV Mid   Full           Yes      Yes                                  +---------+---------------+---------+-----------+----------+---------------+ FV DistalFull                                                         +---------+---------------+---------+-----------+----------+---------------+  PFV      Full                                                         +---------+---------------+---------+-----------+----------+---------------+ POP      Full           Yes      Yes                                  +---------+---------------+---------+-----------+----------+---------------+ PTV                                                   patent by color +---------+---------------+---------+-----------+----------+---------------+ PERO                                                  patent by color +---------+---------------+---------+-----------+----------+---------------+    Summary: RIGHT: - No evidence of common femoral vein obstruction.  LEFT: - There is no evidence of deep vein thrombosis in the lower extremity.  - No cystic structure found in the popliteal fossa.  *See table(s) above for measurements and observations. Electronically signed by Orlie Pollen on 06/24/2022 at 4:17:52 PM.    Final    DG Chest 2 View  Result Date: 06/24/2022 CLINICAL DATA:  Shortness of breath. EXAM: CHEST - 2 VIEW COMPARISON:  May 22, 2022. FINDINGS: Stable cardiomediastinal silhouette. Status post surgical posterior fusion of thoracic spine. Minimal bibasilar subsegmental atelectasis or edema is noted with small pleural effusions. IMPRESSION: Minimal bibasilar subsegmental atelectasis or edema with small pleural effusions. Electronically Signed   By: Marijo Conception M.D.   On: 06/24/2022 12:34      LOS: 1 day     Flora Lipps, MD [facility] Available via Epic secure chat 7am-7pm After these  hours, please refer to coverage provider listed on [email or web address] 06/25/2022, 9:56 AM

## 2022-06-25 NOTE — Consult Note (Signed)
Cardiology Consultation   Patient ID: [name] [name] MRN: [id number]; DOB: 05-Mar-1966  Admit date: 06/24/2022 Date of Consult: 06/25/2022  PCP:  [name], MD   [facility] Cardiologist:  Quay Burow, MD      Patient Profile:   [name] [name] is a 57 y.o. female with a hx of hypertension, septic shock secondary to R foot abscess, sleep apnea, bipolar, asthma, prediabetes who is being seen 06/25/2022 for the evaluation of acute congestive heart failure at the request of Dr. Louanne Belton.  History of Present Illness:   [name] [name] has been admitted from the emergency department with complaints of shortness of breath, 30 pound weight gain, peripheral edema, and worsening fatigue.  On 05/21/2022 she was admitted to [facility] for septic shock secondary to right foot cellulitis and abscess and underwent a I&D. She had an AKI that was considered multifactorial, possibly related to IV contrast and hypotension, but has returned back to normal ranges.  Additionally she has had anemia which has been downtrending consistently since her admission and currently has a hemoglobin of 7.4 with no obvious evidence of bleeding, currently abdominal pain, blood in stool or urine.  Since her admission she has had fluid retention and placed on Lasix 40 mg with little benefit. She was seen 06/21/2022 by her primary care doctor who had discontinued spironolactone and continued her Lasix dose of 40 mg with no improvement.   In the ED: Noted to have bilateral gross pitting edema with a elevated BNP of 247 and also crackles on exam.  Chest x-ray and CTA showed mild to moderate bilateral pleural effusions with pulmonary edema, negative PE. Neg trops. EKG NSR with normal rate.  She was started on IV Lasix 40 mg twice a day, started on supplemental oxygen, pending echo.  She has had of weight change of approximately -17 pounds since starting fluid management.  On examination today she states that she  has had improvement of her symptoms of bilateral leg swelling and shortness of breath. Has minor complaints of pleuritic chest pain when she breathes in and out, but no history or significant risk factors for CAD and is a non smoker. Currently denies shoulder pain, nausea, palpitations. EKG showing no ischemic changes with neg trops. At home she is not very active and ambulates with a walker.  Chart review: Overall no major cardiac history.  She has had an echo done in 2021 for an indication of syncope that was normal. Her ejection fraction during that time was 55 to 60% with no regional wall motion abnormalities and showed normal function of the right and left ventricles.  She has been seen intermittently throughout the years for minor complaints of chest pain and palpitations that have come back with unremarkable workups.She had a recent sleep study indicating sleep apnea but is yet to receive her CPAP machine.     Past Medical History:  Diagnosis Date   Anxiety    Arthritis    Phreesia 02/08/2020   Asthma    mild intermittent   Asthma    Phreesia 02/08/2020   Bipolar 1 disorder ([place])    ect treatments last treatment Sep 02 1011   Depression    Depression    Phreesia 02/08/2020   Depression    Phreesia 07/10/2020   GERD (gastroesophageal reflux disease)    Headache    Hypertension    Pre-diabetes    Seizures ([place])    Thinks it was from taking Tramadol  Sleep apnea    wears CPAP, uncertain of setting   Substance abuse ([place])    Phreesia 02/08/2020    Past Surgical History:  Procedure Laterality Date   ABDOMINAL HYSTERECTOMY     AMPUTATION TOE Left 02/02/2018   Procedure: AMPUTATION TOE Left 4th toe;  Surgeon: [name], DPM;  Location: [facility];  Service: Podiatry;  Laterality: Left;   APPENDECTOMY N/A    Phreesia 02/08/2020   BACK SURGERY  2018   ACDF   C5-6 & C6-7 by Dr. Rennis Harding   CARPAL TUNNEL RELEASE     x2   I & D EXTREMITY Right 05/22/2022   Procedure:  IRRIGATION AND DEBRIDEMENT EXTREMITY;  Surgeon: Felipa Furnace, DPM;  Location: [facility];  Service: Podiatry;  Laterality: Right;   INCISION AND DRAINAGE Right 05/24/2022   Procedure: RIGHT REVISION INCISION AND DRAINAGE/ WASHOUT WITH PRIMARY DELAYED CLOSURE;  Surgeon: [name], DPM;  Location: [facility];  Service: Podiatry;  Laterality: Right;   IRRIGATION AND DEBRIDEMENT ABSCESS Right 10/25/2020   Procedure: IRRIGATION AND DEBRIDEMENT ABSCESS OF FOOT AND APPLICATION OF GRAFT;  Surgeon: [name], DPM;  Location: [facility];  Service: Podiatry;  Laterality: Right;   Laproscopic knee surgery Right    NECK SURGERY  03/15/2017   ACDF   by Dr. Patrice Paradise   PLANTAR FASCIA RELEASE     x2   REVERSE SHOULDER ARTHROPLASTY Right 04/14/2022   Procedure: REVERSE SHOULDER ARTHROPLASTY;  Surgeon: Hiram Gash, MD;  Location: [facility];  Service: Orthopedics;  Laterality: Right;   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   Procedure: TRANSMETATARSAL AMPUTATION;  Surgeon: [name], DPM;  Location: [facility];  Service: Podiatry;  Laterality: Right;   TRANSMETATARSAL AMPUTATION Right 05/27/2022   Procedure: REVISION TRANSMETATARSAL AMPUTATION WITH DELAYED PRIMARY CLOSURE;  Surgeon: [name], DPM;  Location: [facility];  Service: Podiatry;  Laterality: Right;       Inpatient Medications: Scheduled Meds:  busPIRone  15 mg Oral TID   DULoxetine  20 mg Oral Daily   enoxaparin (LOVENOX) injection  60 mg Subcutaneous Q24H   escitalopram  10 mg Oral Daily   ferrous sulfate  325 mg Oral QHS   fesoterodine  4 mg Oral Daily   furosemide  40 mg Intravenous Q12H   haloperidol  5 mg Oral QHS   lamoTRIgine  100 mg Oral Daily   losartan  25 mg Oral Daily   Methylnaltrexone Bromide  450 mg Oral q morning   multivitamin with minerals  1 tablet Oral Daily   pantoprazole  40 mg Oral BID   pregabalin  100 mg Oral TID   sodium chloride flush  3 mL Intravenous Q12H   topiramate  50 mg Oral BID    Continuous Infusions:  sodium chloride     PRN Meds: sodium chloride, acetaminophen **OR** acetaminophen, albuterol, ondansetron **OR** ondansetron (ZOFRAN) IV, oxyCODONE-acetaminophen **AND** oxyCODONE, promethazine, sodium chloride flush, traZODone  Allergies:    Allergies  Allergen Reactions   Other    Tetracyclines & Related Other (See Comments)    Syncope and put her "in a coma"   Tramadol Other (See Comments)    Seizures   Phenazopyridine Other (See Comments)    Unknown   Tetracycline Other (See Comments)    Syncope and "Put me in a coma"   Ciprofloxacin Rash and Itching   Codeine Itching and Rash   Estradiol Rash    Patches broke out the skin  Social History:   Social History   Socioeconomic History   Marital status: Widowed    Spouse name: Not on file   Number of children: 3   Years of education: 13   Highest education level: Not on file  Occupational History   Occupation: Disabled  Tobacco Use   Smoking status: Never   Smokeless tobacco: Never  Vaping Use   Vaping Use: Never used  Substance and Sexual Activity   Alcohol use: No   Drug use: No   Sexual activity: Not Currently  Other Topics Concern   Not on file  Social History Narrative   Patient drink 4 16oz caffeine drinks a day    Social Determinants of Health   Financial Resource Strain: Low Risk  (06/18/2022)   Overall Financial Resource Strain (CARDIA)    Difficulty of Paying Living Expenses: Not very hard  Food Insecurity: No Food Insecurity (06/24/2022)   Hunger Vital Sign    Worried About Running Out of Food in the Last Year: Never true    Ran Out of Food in the Last Year: Never true  Transportation Needs: No Transportation Needs (06/24/2022)   PRAPARE - Hydrologist (Medical): No    Lack of Transportation (Non-Medical): No  Physical Activity: Inactive (08/21/2020)   Exercise Vital Sign    Days of Exercise per Week: 0 days    Minutes of Exercise per Session: 0  min  Stress: No Stress Concern Present (08/21/2020)   [place]    Feeling of Stress : Not at all  Social Connections: Socially Isolated (08/21/2020)   Social Connection and Isolation Panel [NHANES]    Frequency of Communication with Friends and Family: More than three times a week    Frequency of Social Gatherings with Friends and Family: Twice a week    Attends Religious Services: Never    Marine scientist or Organizations: No    Attends Archivist Meetings: Never    Marital Status: Widowed  Intimate Partner Violence: Not At Risk (06/24/2022)   Humiliation, Afraid, Rape, and Kick questionnaire    Fear of Current or Ex-Partner: No    Emotionally Abused: No    Physically Abused: No    Sexually Abused: No    Family History:   Family History  Problem Relation Age of Onset   Diabetes Mother    COPD Mother    Hypertension Mother    Hyperlipidemia Mother    Sleep apnea Mother    Heart disease Father    Hyperlipidemia Father    Hypertension Father    Cancer Father    Sleep apnea Father    Heart disease Brother    Sleep apnea Brother    Heart disease Maternal Grandmother    Hypertension Maternal Grandmother    Sleep apnea Maternal Grandmother    Heart disease Maternal Grandfather    Kidney cancer Paternal Grandmother    Heart disease Paternal Grandfather    Heart disease Daughter      ROS:  Please see the history of present illness.  All other ROS reviewed and negative.     Physical Exam/Data:   Vitals:   06/25/22 0014 06/25/22 0424 06/25/22 0431 06/25/22 0740  BP: (!) 144/86 (!) 157/91  (!) 187/100  Pulse: 75     Resp: 18   18  Temp: 98 F (36.7 C)   97.8 F (36.6 C)  TempSrc: Oral  Oral  SpO2: 100% 100%  99%  Weight:   112 kg   Height:        Intake/Output Summary (Last 24 hours) at 06/25/2022 1103 Last data filed at 06/25/2022 0956 Gross per 24 hour  Intake 120 ml  Output 2300 ml   Net -2180 ml      06/25/2022    4:31 AM 06/24/2022   12:04 PM 06/21/2022   11:08 AM  Last 3 Weights  Weight (lbs) 246 lb 14.6 oz 263 lb 263 lb 3.2 oz  Weight (kg) 112 kg 119.296 kg 119.387 kg     Body mass index is 42.38 kg/m.  General:  Well nourished, well developed, in no acute distress HEENT: normal Neck: no JVD Vascular: No carotid bruits; Distal pulses 2+ bilaterally Cardiac:  normal S1, S2; RRR; no murmur  Lungs:  clear to auscultation bilaterally, no wheezing, rhonchi or rales  Abd: soft, nontender, no hepatomegaly  Ext: no edema Musculoskeletal:  No deformities, BUE and BLE strength normal and equal. 1+edema Skin: warm and dry  Neuro:  CNs 2-12 intact, no focal abnormalities noted Psych:  Normal affect   EKG:  The EKG was personally reviewed and demonstrates:  NSR rate of 79 Telemetry:  Telemetry was personally reviewed and demonstrates:  NSR  Relevant CV Studies:  Echo 08/26/2019  Left Ventricle: Left ventricular ejection fraction, by estimation, is 55  to 60%. The left ventricle has normal function. The left ventricle has no  regional wall motion abnormalities. The left ventricular internal cavity  size was normal in size. There is   no left ventricular hypertrophy. Left ventricular diastolic parameters  were normal.   Right Ventricle: The right ventricular size is normal. No increase in  right ventricular wall thickness. Right ventricular systolic function is  normal. There is normal pulmonary artery systolic pressure. The tricuspid  regurgitant velocity is 2.52 m/s, and   with an assumed right atrial pressure of 10 mmHg, the estimated right  ventricular systolic pressure is 99991111 mmHg.   Left Atrium: Left atrial size was normal in size.   Right Atrium: Right atrial size was normal in size.   Pericardium: There is no evidence of pericardial effusion.   Mitral Valve: The mitral valve is normal in structure. Mild mitral valve  regurgitation.   Tricuspid  Valve: The tricuspid valve is normal in structure. Tricuspid  valve regurgitation is trivial.   Aortic Valve: The aortic valve is normal in structure. Aortic valve  regurgitation is not visualized. Aortic valve mean gradient measures 4.0  mmHg. Aortic valve peak gradient measures 6.2 mmHg. Aortic valve area, by  VTI measures 2.07 cm.   Pulmonic Valve: The pulmonic valve was normal in structure. Pulmonic valve  regurgitation is not visualized.   Aorta: The aortic root and ascending aorta are structurally normal, with  no evidence of dilitation.   IAS/Shunts: No atrial level shunt detected by color flow Doppler.   Laboratory Data:  High Sensitivity Troponin:   Recent Labs  Lab 06/24/22 1219 06/24/22 1632  TROPONINIHS 4 5     Chemistry Recent Labs  Lab 06/21/22 1132 06/24/22 1219 06/25/22 0053  NA 142 140 140  K 4.5 4.0 3.6  CL 110 105 108  CO2 '21 25 27  '$ GLUCOSE 110* 103* 94  BUN 28* 24* 17  CREATININE 1.08* 0.98 0.95  CALCIUM 8.7 9.3 8.8*  GFRNONAA  --  >60 >60  ANIONGAP  --  10 5    No results for input(s): "  PROT", "ALBUMIN", "AST", "ALT", "ALKPHOS", "BILITOT" in the last 168 hours. Lipids No results for input(s): "CHOL", "TRIG", "HDL", "LABVLDL", "LDLCALC", "CHOLHDL" in the last 168 hours.  Hematology Recent Labs  Lab 06/21/22 1132 06/24/22 1219 06/25/22 0053 06/25/22 0931  WBC 4.7 4.4 5.8  --   RBC 2.49* 2.68* 2.38* 2.76*  HGB 7.7* 8.5* 7.5*  --   HCT 24.3* 26.6* 23.3*  --   MCV 97.6 99.3 97.9  --   MCH 30.9 31.7 31.5  --   MCHC 31.7* 32.0 32.2  --   RDW 13.0 14.8 14.8  --   PLT 226 245 230  --    Thyroid No results for input(s): "TSH", "FREET4" in the last 168 hours.  BNP Recent Labs  Lab 06/18/22 1706 06/24/22 1219  BNP 259* 247.5*    DDimer No results for input(s): "DDIMER" in the last 168 hours.   Radiology/Studies:  CT Angio Chest PE W and/or Wo Contrast  Result Date: 06/24/2022 CLINICAL DATA:  Pulmonary embolism (PE) suspected, high  prob Shortness of breath. EXAM: CT ANGIOGRAPHY CHEST WITH CONTRAST TECHNIQUE: Multidetector CT imaging of the chest was performed using the standard protocol during bolus administration of intravenous contrast. Multiplanar CT image reconstructions and MIPs were obtained to evaluate the vascular anatomy. RADIATION DOSE REDUCTION: This exam was performed according to the departmental dose-optimization program which includes automated exposure control, adjustment of the mA and/or kV according to patient size and/or use of iterative reconstruction technique. CONTRAST:  58m OMNIPAQUE IOHEXOL 350 MG/ML SOLN COMPARISON:  Radiograph earlier today. Chest CTA 09/23/2016 FINDINGS: Cardiovascular: Technically limited exam due to breathing motion, streak artifact from spinal and right arm hardware and soft tissue attenuation from habitus. Allowing for these limitations, there is no obvious pulmonary embolus. Normal caliber thoracic aorta without dissection. Heart is upper normal in size. No pericardial effusion. Mediastinum/Nodes: Scattered small mediastinal and bilateral hilar lymph nodes, not enlarged by size criteria. Minimal hiatal hernia. Lungs/Pleura: Small to moderate bilateral pleural effusions with adjacent compressive atelectasis. Smooth septal thickening and ill-defined ground-glass typical of pulmonary edema. No pneumothorax. Upper Abdomen: No acute findings. Large volume of stool in the included colon. Musculoskeletal: Reverse right shoulder arthroplasty. Extensive posterior thoracolumbar fusion hardware. Anterior fusion hardware in the lower cervical spine. No acute findings. Review of the MIP images confirms the above findings. IMPRESSION: 1. No obvious pulmonary embolus allowing for limitations as described above. 2. Small to moderate bilateral pleural effusions with adjacent compressive atelectasis. Smooth septal thickening and ill-defined ground-glass typical of pulmonary edema. Electronically Signed   By:  MKeith RakeM.D.   On: 06/24/2022 17:53   VAS [place]LOWER EXTREMITY VENOUS (DVT)  Result Date: 06/24/2022  Lower Venous DVT Study Patient Name:  TMAYANNA HETZ Date of Exam:   06/24/2022 Medical Rec #: [id number]        Accession #:    [id number] of Birth: 607/23/1967        Patient Gender: F Patient Age:   89years [facility]      VAS [place]OWER EXTREMITY VENOUS (DVT) Referring Phys: NDavonna Belling--------------------------------------------------------------------------------  Indications: Pitting edema, and Pain.  Comparison Study: No prior study on file Performing Technologist: CSharion DoveRVS  Examination Guidelines: A complete evaluation includes B-mode imaging, spectral Doppler, color Doppler, and power Doppler as needed of all accessible portions of each vessel. Bilateral testing is considered an integral part of a complete examination. Limited examinations for reoccurring  indications may be performed as noted. The reflux portion of the exam is performed with the patient in reverse Trendelenburg.  +-----+---------------+---------+-----------+----------+--------------+ RIGHTCompressibilityPhasicitySpontaneityPropertiesThrombus Aging +-----+---------------+---------+-----------+----------+--------------+ CFV  Full           Yes      Yes                                 +-----+---------------+---------+-----------+----------+--------------+   +---------+---------------+---------+-----------+----------+---------------+ LEFT     CompressibilityPhasicitySpontaneityPropertiesThrombus Aging  +---------+---------------+---------+-----------+----------+---------------+ CFV      Full           Yes      Yes                                  +---------+---------------+---------+-----------+----------+---------------+ SFJ      Full                                                          +---------+---------------+---------+-----------+----------+---------------+ FV Prox  Full                                                         +---------+---------------+---------+-----------+----------+---------------+ FV Mid   Full           Yes      Yes                                  +---------+---------------+---------+-----------+----------+---------------+ FV DistalFull                                                         +---------+---------------+---------+-----------+----------+---------------+ PFV      Full                                                         +---------+---------------+---------+-----------+----------+---------------+ POP      Full           Yes      Yes                                  +---------+---------------+---------+-----------+----------+---------------+ PTV                                                   patent by color +---------+---------------+---------+-----------+----------+---------------+ PERO  patent by color +---------+---------------+---------+-----------+----------+---------------+    Summary: RIGHT: - No evidence of common femoral vein obstruction.  LEFT: - There is no evidence of deep vein thrombosis in the lower extremity.  - No cystic structure found in the popliteal fossa.  *See table(s) above for measurements and observations. Electronically signed by Orlie Pollen on 06/24/2022 at 4:17:52 PM.    Final    DG Chest 2 View  Result Date: 06/24/2022 CLINICAL DATA:  Shortness of breath. EXAM: CHEST - 2 VIEW COMPARISON:  May 22, 2022. FINDINGS: Stable cardiomediastinal silhouette. Status post surgical posterior fusion of thoracic spine. Minimal bibasilar subsegmental atelectasis or edema is noted with small pleural effusions. IMPRESSION: Minimal bibasilar subsegmental atelectasis or edema with small pleural effusions. Electronically Signed   By: Marijo Conception M.D.   On: 06/24/2022 12:34     Assessment and Plan:   Acute congestive heart failure Patient presenting with signs/symptoms consistent with fluid overload and pulmonary congestion (bilateral LE edema, pulmonary edema, shortness of breath, BNP 247, recent weight gain) likely related to some cardiac dysfunction.  An echocardiogram has been ordered to assess if there is presence of heart failure.  Last echocardiogram in 2021 demonstrated normal heart function with no valvular issues, an EF of 55 to 60%.  Of note this is in the setting of a recent admission where she was admitted for septic shock secondary to a R foot abscess.  Currently she is receiving IV Lasix 40 mg twice daily placed on fluid restriction and low-salt diet with improvement of symptoms.  I agree with this plan along with close monitoring of her kidney function due to prior history of AKI recently.  Will establish GDMT if evidence of systolic or diastolic dysfunction. - Continue IV Lasix 40 mg BID - echo pending  - down 17lbs since admission, I/O -2,060   HTN Newly diagnosed and started on losartan '25mg'$   OSA Had recent sleep study done but waiting to receive CPAP  Anemia Patient has had consistent down trending anemia since admission on February 2024.  No evidence of acute GI bleeding.  Denies abdominal pain or any blood in the urine or the stool.  Consider GI consultation. If EF comes back back reduced and hemoglobin continues to down trend, may need blood transfusion.  - 7.4 hemoglobin    Risk Assessment/Risk Scores:   New York Heart Association (NYHA) Functional Class NYHA Class III        For questions or updates, please contact [place] Please consult [email or web address] for contact info under    Signed, Bonnee Quin, PA-C  06/25/2022 11:03 AM

## 2022-06-25 NOTE — Telephone Encounter (Signed)
Pt's mother called to inform us that [name] [name] is in the hospital. She had an appt today to have her staples removed; pt wanted to know if Dr. Amalia Hailey or any provider would be able to come & remove her staples in the hospital. Her mother, Sandi Carne is the contact person, [phone]. Please advise

## 2022-06-25 NOTE — TOC Initial Note (Addendum)
Transition of Care [facility]) - Initial/Assessment Note    Patient Details  Name: [name] [name] MRN: [id number] Date of Birth: February 17, 1966  Transition of Care [facility]) CM/SW Contact:    Zenon Mayo, RN Phone Number: 06/25/2022, 1:48 PM  Clinical Narrative:                 From home with Mom, CHF Ex, groin pain, hx of bipolar d/o, right foot toes amputated. Conts on iv lasix,  she is active with [facility] for [place], [place], [place].  NCM offered choice with Medicare. Gov. She states she would like to continue with [facility].  NCM confirmed with [facility].  Patient states her Mom will transport her home at discharge.  Patient was asking about a bariatric BSC, she states the one she has at home is too small.  NCM informed her that the criteria weight for that is over 300 lbs.  She is not at that weight so she would not qualify for one, Thru her insurance, she could pay for it out of pocket.     Expected Discharge Plan: [place] Barriers to Discharge: Continued Medical Work up   Patient Goals and CMS Choice Patient states their goals for this hospitalization and ongoing recovery are:: return home with Mom CMS Medicare.gov Compare Post Acute Care list provided to:: Patient Choice offered to / list presented to : Patient      Expected Discharge Plan and Services In-house Referral: NA Discharge Planning Services: CM Consult Post Acute Care Choice: Home Health, Resumption of Svcs/PTA Provider Living arrangements for the past 2 months: Single Family Home                 DME Arranged: N/A DME Agency: NA       HH Arranged: RN, PT, OT [place] Agency: [place] Date [place]: 06/25/22 Time [place]: 1347 Representative spoke with at [place]: Tommi Rumps  Prior Living Arrangements/Services Living arrangements for the past 2 months: [place] Lives with:: Parents Patient language and need for interpreter reviewed:: Yes Do you feel safe going  back to the place where you live?: Yes      Need for Family Participation in Patient Care: Yes (Comment) Care giver support system in place?: Yes (comment) Current home services: Home OT, Home PT, Home RN [facility] home care) Criminal Activity/Legal Involvement Pertinent to Current Situation/Hospitalization: No - Comment as needed  Activities of Daily [place] Devices/Equipment: Wheelchair, Radio producer (specify quad or straight) ADL Screening (condition at time of admission) Patient's cognitive ability adequate to safely complete daily activities?: Yes Is the patient deaf or have difficulty hearing?: No Does the patient have difficulty seeing, even when wearing glasses/contacts?: No Does the patient have difficulty concentrating, remembering, or making decisions?: No Patient able to express need for assistance with ADLs?: Yes Does the patient have difficulty dressing or bathing?: No Independently performs ADLs?: No Communication: Independent Dressing (OT): Independent Grooming: Needs assistance Is this a change from baseline?: Pre-admission baseline Feeding: Independent Bathing: Needs assistance Is this a change from baseline?: Pre-admission baseline Toileting: Independent In/Out Bed: Independent Walks in Home: Independent Does the patient have difficulty walking or climbing stairs?: Yes Weakness of Legs: Both Weakness of Arms/Hands: None  Permission Sought/Granted                  Emotional Assessment Appearance:: Appears stated age Attitude/Demeanor/Rapport: Engaged Affect (typically observed): Appropriate Orientation: : Oriented to Self, Oriented to  Place, Oriented to  Time, Oriented to Situation Alcohol / Substance Use: Not Applicable Psych Involvement: No (comment)  Admission diagnosis:  CHF (congestive heart failure) ([name]) [I50.9] Peripheral edema [R60.9] Patient Active Problem List   Diagnosis Date Noted   Anemia 06/25/2022   CHF (congestive heart  failure) ([place]) [id number]   Acute metabolic encephalopathy 0000000   Right foot infection 05/27/2022   Morbid obesity ([place]) 05/26/2022   Cellulitis of right foot 05/22/2022   Severe sepsis ([place]) 05/22/2022   AMS (altered mental status) 05/22/2022   Septic shock ([place]) 05/22/2022   Abscess of right foot 05/22/2022   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   Diarrhea in adult patient    Weakness 06/30/2021   Gastroenteritis, infectious 06/30/2021   AKI (acute kidney injury) ([place]) 06/30/2021   Hyponatremia 06/30/2021   Open wound of right foot, initial encounter 10/21/2020   Finger ulcer ([place]) 10/21/2020   Syncope 08/26/2019   Non-healing open wound of toe 05/15/2019   Osteomyelitis ([place]) 05/15/2019   Palpitations 10/26/2018   Foot ulcer ([place]) 01/31/2018   Hyperglycemia 01/31/2018   Left leg cellulitis 09/24/2017   Syncope and collapse    Near syncope 11/23/2016   Abdominal pain 09/23/2016   Anxiety 09/23/2016   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[place]) 09/23/2016   Constipation 09/23/2016   DDD (degenerative disc disease), lumbosacral 09/23/2016   Migraines 09/23/2016   Myelopathy ([place]) 09/23/2016   Nausea 09/23/2016   Post laminectomy syndrome 09/23/2016   Pseudoarthrosis of lumbar spine 09/23/2016   Shortness of breath 09/23/2016   Sleep apnea 09/23/2016   Spinal stenosis of lumbar region 09/23/2016   Vertigo 09/23/2016   Hallux malleus 11/15/2015   Chronic pain syndrome    GERD (gastroesophageal reflux disease)    Depression    Rash 11/14/2015   Toe ulcer, right ([place]) 04/11/2015   Cellulitis of toe of right foot 04/11/2015   Nonspecific chest pain    Chest pain 10/26/2014   Obesity (BMI 30-39.9) 10/26/2014   Hypokalemia 10/26/2014   Manic bipolar I disorder ([place]) 10/26/2014   Atypical angina 10/26/2014   Tardive dyskinesia 08/26/2011   Manic bipolar I disorder with rapid cycling ([place]) 05/18/2011    Class: Acute   Asthma 02/24/2011    Essential hypertension 02/24/2011   History of migraine headaches 02/24/2011   PCP:  [name], MD Pharmacy:   [place], [place] [address] [place] [address] Phone: [phone] Fax: [place], [place] - [address] [place] [address] Phone: [phone] Fax: [phone]  [id number] [place], [place] [address]: [phone]: [phone] [id number] [place] [address] [id number]: [phone]: [phone]   Social Determinants of Health (SDOH) Social History: SDOH Screenings   Food Insecurity: No Food Insecurity (06/24/2022)  Housing: Low Risk  (06/24/2022)  Transportation Needs: No Transportation Needs (06/24/2022)  Utilities: Not At Risk (06/24/2022)  Alcohol Screen: Low Risk  (08/21/2020)  Depression (PHQ2-9): Low Risk  (06/21/2022)  Financial Resource Strain: Low Risk  (06/18/2022)  Physical Activity: Inactive (08/21/2020)  Social Connections: Socially Isolated (08/21/2020)  Stress: No Stress Concern Present (08/21/2020)  Tobacco Use: Low Risk  (06/24/2022)   SDOH Interventions:  Housing Interventions: Intervention Not Indicated   Readmission Risk Interventions    06/25/2022    1:44 PM  Readmission Risk Prevention Plan  Transportation Screening Complete  PCP or Specialist Appt within 3-5 Days Complete  HRI or Home Care Consult Complete  Palliative Care Screening Not Applicable  Medication Review (RN Care Manager) Complete

## 2022-06-25 NOTE — Progress Notes (Signed)
  Subjective:  Patient ID: [name] [name], female    DOB: October 18, 1965,  MRN: [id number]  Chief Complaint  Patient presents with   Shortness of Breath   Groin Pain    DOS: 28/24 Procedure: Left foot revision trans metatarsal ampuation  57 y.o. female admitted for CHF exacerbation. Was supposed to come into office to see Dr. Amalia Hailey today but was admitted to he asked me to see for suture removal. She has had dressing to Right foot. Wearing CAM boot. Denies issues with healing.  Review of Systems: Negative except as noted in the HPI. Denies N/V/F/Ch.   Objective:   Vitals:   06/25/22 0740 06/25/22 1101  BP: (!) 187/100 (!) 165/83  Pulse:  (!) 58  Resp: 18 18  Temp: 97.8 F (36.6 C) 97.9 F (36.6 C)  SpO2: 99% 98%   Body mass index is 42.38 kg/m. Constitutional Well developed. Well nourished.  Vascular Foot warm and well perfused. Capillary refill normal to all digits.  Calf is soft and supple, no posterior calf or knee pain, negative Homans' sign  Neurologic Normal speech. Oriented to person, place, and time. Epicritic sensation to light touch absent bilaterally  Dermatologic Skin to right foot is fully  healed. No dehisence erythema or drainage from the site. Eschar and callus along incision line debrided. Dorsal left foot with small superficial abrasion.    Orthopedic: No pain to palpation noted about the surgical site.   Multiple view plain film radiographs: Deferred Assessment:   4 weeks s/p R foot revision trans met amputation, healing well with no concerns for necrosis/ dehiscence /residual infection at this time  Also with superficial eschar / abrasion to left dorsal midfoot without any infection present.  Plan:  Patient was evaluated and treated and all questions answered.  S/p foot surgery right -Progressing as expected post-operatively. Fully healed surgical site at this time -XR: deferred -WB Status: WBAT in CAM boot to Right foot. Does not need cam boot  when in bed. -Sutures/Staples - Removed in total to right foot. -Right Foot redressed with ace wrap. Does not need gauze to right foot. Continue with left foot small gauze dressing for abrasion, betadine applied.  -Will sign off at this time. Recommend patient follow up in office with Dr. Amalia Hailey in 1 mo.         Everitt Amber, DPM [facility]

## 2022-06-25 NOTE — Evaluation (Signed)
Physical Therapy Evaluation Patient Details Name: [name] [name] MRN: [id number] DOB: 12/04/1965 Today's Date: 06/25/2022  History of Present Illness  57 yo female [facility] to [facility] from PCP on 3/7 with SOB x2 weeks, LE swelling. Workup for acute HF. 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  Clinical Impression   Pt presents with generalized weakness, back and LE pain, impaired activity tolerance, and min dyspnea on exertion but sats WFL on RA (97% post-gait). Pt to benefit from acute PT to address deficits. Pt ambulated hallway distance with use of RW and close guard, no physical assist needed. PT to progress mobility as tolerated, and will continue to follow acutely.         Recommendations for follow up therapy are one component of a multi-disciplinary discharge planning process, led by the attending physician.  Recommendations may be updated based on patient status, additional functional criteria and insurance authorization.  Follow Up Recommendations Home health PT Can patient physically be transported by private vehicle: Yes    Assistance Recommended at Discharge Set up Supervision/Assistance  Patient can return home with the following  A little help with walking and/or transfers;A little help with bathing/dressing/bathroom;Assistance with cooking/housework;Assist for transportation;Help with stairs or ramp for entrance    Equipment Recommendations None recommended by PT  Recommendations for Other Services       Functional Status Assessment Patient has had a recent decline in their functional status and demonstrates the ability to make significant improvements in function in a reasonable and predictable amount of time.     Precautions / Restrictions Precautions Precautions: Fall Required Braces or Orthoses: Other Brace Other Brace: CAM walker boot Restrictions Weight Bearing Restrictions:  Yes RLE Weight Bearing: Weight bearing as tolerated      Mobility  Bed Mobility Overal bed mobility: Needs Assistance Bed Mobility: Sit to Supine       Sit to supine: Supervision, HOB elevated        Transfers Overall transfer level: Needs assistance Equipment used: Rolling walker (2 wheels) Transfers: Sit to/from Stand Sit to Stand: Min guard           General transfer comment: for safety, cues for hand placement when moving stand to sit .    Ambulation/Gait Ambulation/Gait assistance: Min guard Gait Distance (Feet): 75 Feet Assistive device: Rolling walker (2 wheels) Gait Pattern/deviations: Step-through pattern, Decreased stride length, Trunk flexed Gait velocity: decr     General Gait Details: cues for RW use, increased time and effort  Stairs            Wheelchair Mobility    Modified Rankin (Stroke Patients Only)       Balance Overall balance assessment: Needs assistance Sitting-balance support: No upper extremity supported, Feet supported Sitting balance-Leahy Scale: Good     Standing balance support: Bilateral upper extremity supported, During functional activity Standing balance-Leahy Scale: Fair                               Pertinent Vitals/Pain Pain Assessment Pain Assessment: Faces Faces Pain Scale: Hurts little more Pain Location: back, L hip (chronic) Pain Descriptors / Indicators: Aching, Sore Pain Intervention(s): Monitored during session, Limited activity within patient's tolerance, Repositioned    Home Living Family/patient expects to be discharged to:: Private residence Living Arrangements: Parent Available Help at Discharge: Family Type of Home: House Home Access: Ramped entrance Entrance  Stairs-Rails: Left Entrance Stairs-Number of Steps: 3   Home Layout: One level Home Equipment: Conservation officer, nature (2 wheels);Cane - single point;BSC/3in1      Prior Function Prior Level of Function : Needs assist              Mobility Comments: pt reports using RW to mobilize, WBAT in CAM boot ADLs Comments: PRN help for dressing and bathing     Hand Dominance   Dominant Hand: Right    Extremity/Trunk Assessment   Upper Extremity Assessment Upper Extremity Assessment: Defer to OT evaluation    Lower Extremity Assessment Lower Extremity Assessment: Generalized weakness    Cervical / Trunk Assessment Cervical / Trunk Assessment: Normal  Communication   Communication: No difficulties  Cognition Arousal/Alertness: Awake/alert Behavior During Therapy: Flat affect Overall Cognitive Status: Within Functional Limits for tasks assessed                                          General Comments      Exercises     Assessment/Plan    PT Assessment Patient needs continued PT services  PT Problem List Decreased strength;Decreased range of motion;Decreased balance;Decreased activity tolerance;Decreased knowledge of use of DME;Decreased knowledge of precautions;Decreased mobility       PT Treatment Interventions DME instruction;Gait training;Stair training;Functional mobility training;Therapeutic activities;Therapeutic exercise;Balance training;Neuromuscular re-education;Patient/family education    PT Goals (Current goals can be found in the Care Plan section)  Acute Rehab PT Goals PT Goal Formulation: With patient Time For Goal Achievement: 07/09/22 Potential to Achieve Goals: Good    Frequency Min 3X/week     Co-evaluation               AM-PAC PT "6 Clicks" Mobility  Outcome Measure Help needed turning from your back to your side while in a flat bed without using bedrails?: None Help needed moving from lying on your back to sitting on the side of a flat bed without using bedrails?: None Help needed moving to and from a bed to a chair (including a wheelchair)?: None Help needed standing up from a chair using your arms (e.g., wheelchair or bedside chair)?: A  Little Help needed to walk in hospital room?: A Little Help needed climbing 3-5 steps with a railing? : A Little 6 Click Score: 21    End of Session   Activity Tolerance: Patient limited by fatigue;Patient tolerated treatment well Patient left: in bed;with call bell/phone within reach;Other (comment) (Cardiologist in room, pt has been getting up ad lib to use the bathroom since admission with RN staff, so bed alarm not set) Nurse Communication: Mobility status PT Visit Diagnosis: Muscle weakness (generalized) (M62.81);Difficulty in walking, not elsewhere classified (R26.2)    Time: [id number] PT Time Calculation (min) (ACUTE ONLY): 22 min   Charges:   PT Evaluation $PT Eval Low Complexity: 1 Low          Tiffiany Beadles S, PT DPT Acute Rehabilitation Services Pager [phone]  Office [phone]   [place] E Ruffin Pyo 06/25/2022, 1:21 PM

## 2022-06-25 NOTE — Progress Notes (Signed)
Echocardiogram 2D Echocardiogram has been performed.  [name] [name] 06/25/2022, 3:02 PM

## 2022-06-26 DIAGNOSIS — E8770 Fluid overload, unspecified: Secondary | ICD-10-CM

## 2022-06-26 LAB — BASIC METABOLIC PANEL
Anion gap: 7 (ref 5–15)
BUN: 17 mg/dL (ref 6–20)
CO2: 28 mmol/L (ref 22–32)
Calcium: 8.8 mg/dL — ABNORMAL LOW (ref 8.9–10.3)
Chloride: 103 mmol/L (ref 98–111)
Creatinine, Ser: 1.23 mg/dL — ABNORMAL HIGH (ref 0.44–1.00)
GFR, Estimated: 51 mL/min — ABNORMAL LOW (ref >60)
Glucose, Bld: 108 mg/dL — ABNORMAL HIGH (ref 70–99)
Potassium: 3.4 mmol/L — ABNORMAL LOW (ref 3.5–5.1)
Sodium: 138 mmol/L (ref 135–145)

## 2022-06-26 LAB — CBC
HCT: 25.1 % — ABNORMAL LOW (ref 36.0–46.0)
Hemoglobin: 8 g/dL — ABNORMAL LOW (ref 12.0–15.0)
MCH: 30.9 pg (ref 26.0–34.0)
MCHC: 31.9 g/dL (ref 30.0–36.0)
MCV: 96.9 fL (ref 80.0–100.0)
Platelets: 259 10*3/uL (ref 150–400)
RBC: 2.59 MIL/uL — ABNORMAL LOW (ref 3.87–5.11)
RDW: 14.8 % (ref 11.5–15.5)
WBC: 6.9 10*3/uL (ref 4.0–10.5)
nRBC: 0 % (ref 0.0–0.2)

## 2022-06-26 LAB — MAGNESIUM: Magnesium: 2 mg/dL (ref 1.7–2.4)

## 2022-06-26 MED ORDER — POTASSIUM CHLORIDE CRYS ER 20 MEQ PO TBCR
40.0000 meq | EXTENDED_RELEASE_TABLET | Freq: Once | ORAL | Status: AC
  Administered 2022-06-26: 40 meq via ORAL
  Filled 2022-06-26: qty 2

## 2022-06-26 MED ORDER — SUMATRIPTAN SUCCINATE 50 MG PO TABS
50.0000 mg | ORAL_TABLET | Freq: Once | ORAL | Status: AC
  Administered 2022-06-26: 50 mg via ORAL
  Filled 2022-06-26 (×2): qty 1

## 2022-06-26 NOTE — Plan of Care (Signed)
  Problem: Safety: Goal: Ability to remain free from injury will improve Outcome: Completed/Met   

## 2022-06-26 NOTE — Evaluation (Signed)
Occupational Therapy Evaluation Patient Details Name: [name] [name] MRN: [id number] DOB: Aug 17, 1965 Today's Date: 06/26/2022   History of Present Illness 57 yo female presents to [facility] from PCP on 3/7 with SOB x2 weeks, LE swelling. Workup for acute HF. 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   Clinical Impression   [name] [name] was evaluated s/p the above admission list. She requires assist for ADLs and mobility at baseline. Upon evaluation she was limited by activity tolerance, cardiopulmonary endurance, balance, generalized weakness and chronic pain. Overall she required close min G for mobility with RW and tolerated walking a ~household distance. Due to the deficits listed below, she also requires up to mod A for LB ADLs and set up for UB ADLs in sitting. Pt will benefit from continued acute OT services. Recommend d/c to home with continued assist from her mom and [place].       Recommendations for follow up therapy are one component of a multi-disciplinary discharge planning process, led by the attending physician.  Recommendations may be updated based on patient status, additional functional criteria and insurance authorization.   Follow Up Recommendations  Home health OT     Assistance Recommended at Discharge Intermittent Supervision/Assistance  Patient can return home with the following A little help with walking and/or transfers;A lot of help with bathing/dressing/bathroom;Assistance with cooking/housework;Direct supervision/assist for medications management;Direct supervision/assist for financial management;Assist for transportation;Help with stairs or ramp for entrance    Functional Status Assessment  Patient has had a recent decline in their functional status and demonstrates the ability to make significant improvements in function in a reasonable and predictable amount of time.  Equipment  Recommendations  None recommended by OT    Recommendations for Other Services       Precautions / Restrictions Precautions Precautions: Fall Required Braces or Orthoses: Other Brace Other Brace: CAM walker boot Restrictions Weight Bearing Restrictions: Yes RUE Weight Bearing: Weight bearing as tolerated RLE Weight Bearing: Weight bearing as tolerated      Mobility Bed Mobility Overal bed mobility: Needs Assistance Bed Mobility: Sidelying to Sit, Rolling Rolling: Min guard Sidelying to sit: Min guard, HOB elevated            Transfers Overall transfer level: Needs assistance Equipment used: Rolling walker (2 wheels) Transfers: Sit to/from Stand Sit to Stand: Min guard                  Balance Overall balance assessment: Needs assistance Sitting-balance support: No upper extremity supported, Feet supported Sitting balance-Leahy Scale: Good     Standing balance support: During functional activity, Single extremity supported Standing balance-Leahy Scale: Fair                             ADL either performed or assessed with clinical judgement   ADL Overall ADL's : Needs assistance/impaired Eating/Feeding: Independent;Sitting   Grooming: Set up;Sitting   Upper Body Bathing: Set up;Sitting   Lower Body Bathing: Moderate assistance;Sit to/from stand   Upper Body Dressing : Set up;Sitting   Lower Body Dressing: Moderate assistance;Sit to/from stand   Toilet Transfer: Min guard;Ambulation;Rolling walker (2 wheels)   Toileting- Clothing Manipulation and Hygiene: Min guard;Sitting/lateral lean       Functional mobility during ADLs: Min guard;Rolling walker (2 wheels) General ADL Comments: mom assists with LB ADLs at baseline.     Vision Baseline Vision/History: 0 No  visual deficits Vision Assessment?: No apparent visual deficits     Perception Perception Perception Tested?: No   Praxis Praxis Praxis tested?: Not tested     Pertinent Vitals/Pain Pain Assessment Pain Assessment: Faces Faces Pain Scale: Hurts a little bit Pain Location: back, L hip (chronic) Pain Descriptors / Indicators: Aching, Sore Pain Intervention(s): Limited activity within patient's tolerance, Monitored during session     Hand Dominance Right   Extremity/Trunk Assessment Upper Extremity Assessment Upper Extremity Assessment: Generalized weakness;RUE deficits/detail;LUE deficits/detail RUE Deficits / Details: hx of R shoulder sx; pt reports she was not able to the rehab post-op and not has chronic pain and ROM  limitations RUE: Shoulder pain with ROM;Shoulder pain at rest LUE Deficits / Details: Pt self reports she has a rotator cuff tear LUE: Shoulder pain with ROM;Shoulder pain at rest LUE Coordination: decreased gross motor   Lower Extremity Assessment Lower Extremity Assessment: Defer to PT evaluation   Cervical / Trunk Assessment Cervical / Trunk Assessment: Normal   Communication Communication Communication: No difficulties   Cognition Arousal/Alertness: Awake/alert Behavior During Therapy: Flat affect Overall Cognitive Status: Within Functional Limits for tasks assessed                                 General Comments: WFL for basic tasks assessed     General Comments  VSS on RA. donned CAM boot for mobility, pt self reports BLE WBAT.    Exercises     Shoulder Instructions      Home Living Family/patient expects to be discharged to:: Private residence Living Arrangements: Parent Available Help at Discharge: Family Type of Home: House Home Access: Ramped entrance Entrance Stairs-Number of Steps: 3 Entrance Stairs-Rails: Left [place]: One level     Bathroom Shower/Tub: Occupational psychologist: [place]: Conservation officer, nature (2 wheels);Cane - single point;BSC/3in1   Additional Comments: Pt lives with her mom, ramp to enter, has RW, knees scooter.       Prior Functioning/Environment Prior Level of Function : Needs assist             Mobility Comments: pt reports using RW to mobilize, WBAT in CAM boot ADLs Comments: PRN help for dressing and bathing        OT Problem List: Decreased strength;Decreased range of motion;Decreased activity tolerance;Impaired balance (sitting and/or standing);Decreased safety awareness;Decreased knowledge of use of DME or AE;Decreased knowledge of precautions;Cardiopulmonary status limiting activity;Pain      OT Treatment/Interventions: Self-care/ADL training;Therapeutic exercise;DME and/or AE instruction;Energy conservation;Therapeutic activities;Patient/family education;Balance training    OT Goals(Current goals can be found in the care plan section) Acute Rehab OT Goals Patient Stated Goal: home OT Goal Formulation: With patient Time For Goal Achievement: 07/10/22 Potential to Achieve Goals: Good ADL Goals Pt Will Perform Lower Body Dressing: with min assist Pt Will Transfer to Toilet: ambulating;with modified independence Additional ADL Goal #1: Pt will complete at least 3 standing ADLs without sitting rest break to demonstrate increased activity tolerance Additional ADL Goal #2: Pt will indep recall at least 3 energy conservation strategies to apply at discharge  OT Frequency: Min 2X/week    Co-evaluation              AM-PAC OT "6 Clicks" Daily Activity     Outcome Measure Help from another person eating meals?: None Help from another person taking care of personal grooming?: A Little Help from  another person toileting, which includes using toliet, bedpan, or urinal?: A Little Help from another person bathing (including washing, rinsing, drying)?: A Lot Help from another person to put on and taking off regular upper body clothing?: A Little Help from another person to put on and taking off regular lower body clothing?: A Lot 6 Click Score: 17   End of Session Equipment Utilized During  Treatment: Gait belt;Rolling walker (2 wheels) Nurse Communication: Mobility status  Activity Tolerance: Patient tolerated treatment well Patient left: in chair;with call bell/phone within reach  OT Visit Diagnosis: Unsteadiness on feet (R26.81);Other abnormalities of gait and mobility (R26.89);Muscle weakness (generalized) (M62.81);Pain                Time: 1200-1220 OT Time Calculation (min): 20 min Charges:  OT General Charges $OT Visit: 1 Visit OT Evaluation $OT Eval Moderate Complexity: 1 Mod  Shade Flood, OTR/L Acute Rehabilitation Services Office [place] Communication Preferred   Elliot Cousin 06/26/2022, 1:57 PM

## 2022-06-26 NOTE — Progress Notes (Signed)
PROGRESS NOTE    [name] [name]  [id number] DOB: 1965-11-24 DOA: 06/24/2022 PCP: [name] Frizzle, MD    Brief Narrative:  Patient is a 57 years old female with past medical history of asthma, bipolar disorder, hypertension, prediabetes, sleep apnea presents to the hospital with shortness of breath, leg swelling and worsening fatigue with orthopnea and ambulatory dysfunction due to increasing edema.  Had 30 pound weight gain.  Was on Lasix as outpatient with her primary care physician but was not getting better so she decided to come to the hospital.  In the ED, patient had bilateral gross pitting edema and BNP was elevated.  Chest x-ray showed some evidence of congestion.  Patient was then admitted hospital for congestive heart failure, failed outpatient treatment.  Assessment and Plan   Acute diastolic heart failure.  Presented with worsening dyspnea, paroxysmal nocturnal dyspnea and crackles on exam with peripheral pitting edema.   Failed outpatient treatment with diuretics.  Elevated BNP.  Received IV Lasix 40 mg IV twice daily.  Continue CHF protocol with strict intake and output charting Daily weights.  Low-salt diet and fluid restriction. CTA PE protocol was ordered from the ED which showed no evidence of pulmonary embolism but small to moderate bilateral pleural effusion with pulmonary edema.  .  2D echocardiogram shows LV ejection fraction of 55 to 60%..  Cardiology planning to transition to oral diuretic by tomorrow.  Patient has been on ARB and spironolactone.  Currently has been switched off  supplemental oxygen.   Dyspnea, shortness of breath with leg swelling and left leg pain.. On albuterol at home.  Continue bronchodilators.  Ultrasound of the lower extremity was negative for DVT.  Will be following the patient.  Transition plan for oral diuretic from 06/27/2022.    Hypokalemia.  Will replenish with oral potassium.  Check levels in AM.  Ambulatory dysfunction due to volume  overload.  Will continue IV diuresis.  Will get PT OT evaluation   Recent AKI.  Patient was on meloxicam and spironolactone which was discontinued.  Creatinine on presentation 0.9.  Creatinine has slightly bumped up to 1.2 today.  Will hold off with IV diuretics.  Check BMP in AM.  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  Had a recent prolonged hospitalization secondary to  this problem.  Was seen by podiatry during hospitalization.  Recommend follow-up with Dr. Amalia [name] podiatry in 1 month.   History of sleep apnea Patient recently had sleep study done but is yet to receive her CPAP machine.  Continue auto titrating CPAP while in the hospital.   Hypertension as per history. Cardiology has increased the dose of losartan.  Will hold off with the beta-blocker for now due to decompensated heart failure.  Continue spironolactone.   History of bipolar disorder.  Continue Cymbalta, buspirone, Haldol, Lamictal and Lyrica, trazodone.     History of migraine.  Patient take Aimovig monthly as outpatient including Topamax.  Has recently received Aimovig.  Continues to have a migraine headache.  Will try Tylenol if not sumatriptan x 1.  Patient states that she takes Imitrex at home.  Anemia.  Patient denies any obvious blood loss.  Hemoglobin today at 8.0.  Iron level at 87 ferritin [id number] folic acid 24 vitamin [id number] at 575.  Reticulocyte count 2.1%.   DVT prophylaxis: Lovenox    Code Status:     Code Status: Full Code  Disposition: Home with home health likely in 1 to 2 days.  Status is: Inpatient  Remains inpatient appropriate because: Decompensated heart failure.   Family Communication: None at bedside  Consultants:  Cardiology  Procedures:  None  Antimicrobials:  None  Anti-infectives (From admission, onward)    None       Subjective: Today, patient was seen and examined at bedside.  Still complains of headache and wishes for Imitrex.   Denies any nausea, vomiting, fever, chills or rigor.  Feels better with breathing.  Was able to lie flat in bed.  Vitals:   06/26/22 0004 06/26/22 0519 06/26/22 0802 06/26/22 0811  BP: (!) 140/80 (!) 148/76 (!) 141/85   Pulse: 81 85 77   Resp: '17 17 18   '$ Temp: 98.2 F (36.8 C) 98 F (36.7 C) 98 F (36.7 C)   TempSrc: Oral Oral Oral   SpO2: 96% 93% 97%   Weight: 106.6 kg   106.6 kg  Height:        Intake/Output Summary (Last 24 hours) at 06/26/2022 1105 Last data filed at 06/26/2022 1047 Gross per 24 hour  Intake 240 ml  Output 3600 ml  Net -3360 ml    Filed Weights   06/25/22 0431 06/26/22 0004 06/26/22 0811  Weight: 112 kg 106.6 kg 106.6 kg    Physical Examination: Body mass index is 40.34 kg/m.   General: Alert awake and Communicative, obese built, in mild distress due to migraine headache.  On room air HENT:   No scleral pallor or icterus noted. Oral mucosa is moist.  Chest:   Diminished breath sounds bilaterally. CVS: S1 &S2 heard. No murmur.  Regular rate and rhythm. Abdomen: Soft, nontender, nondistended.  Bowel sounds are heard.   Extremities: No cyanosis, clubbing with trace lower extremity edema.  Psych: Alert, awake and oriented, normal mood CNS:  No cranial nerve deficits.  Power equal in all extremities.   Skin: Warm and dry.  No rashes noted.  Data Reviewed:   CBC: Recent Labs  Lab 06/21/22 1132 06/24/22 1219 06/25/22 0053 06/26/22 0056  WBC 4.7 4.4 5.8 6.9  NEUTROABS 3,224 2.7  --   --   HGB 7.7* 8.5* 7.5* 8.0*  HCT 24.3* 26.6* 23.3* 25.1*  MCV 97.6 99.3 97.9 96.9  PLT 226 245 230 259     Basic Metabolic Panel: Recent Labs  Lab 06/21/22 1132 06/24/22 1219 06/25/22 0053 06/26/22 0056  NA 142 140 140 138  K 4.5 4.0 3.6 3.4*  CL 110 105 108 103  CO2 '21 25 27 28  '$ GLUCOSE 110* 103* 94 108*  BUN 28* 24* 17 17  CREATININE 1.08* 0.98 0.95 1.23*  CALCIUM 8.7 9.3 8.8* 8.8*  MG  --   --   --  2.0     Liver Function Tests: No results for  input(s): "AST", "ALT", "ALKPHOS", "BILITOT", "PROT", "ALBUMIN" in the last 168 hours.   Radiology Studies: ECHOCARDIOGRAM COMPLETE  Result Date: 06/25/2022    ECHOCARDIOGRAM REPORT   Patient Name:   [name] [name] Date of Exam: 06/25/2022 Medical Rec #:  [id number]        Height:       64.0 in Accession #:    [id number]       Weight:       246.9 lb Date of Birth:  11-11-1965        BSA:          2.140 m Patient Age:    51 years         BP:  158/92 mmHg Patient Gender: F                HR:           57 bpm. Exam Location:  Inpatient Procedure: 2D Echo, Color Doppler and Cardiac Doppler Indications:    congestive heart failure  History:        Patient has prior history of Echocardiogram examinations, most                 recent 08/26/2019. CHF, Arrythmias:LBBB; Signs/Symptoms:Shortness                 of Breath.  Sonographer:    Ronny Flurry Referring Phys: [id number] Kim  1. Left ventricular ejection fraction, by estimation, is 55 to 60%. The left ventricle has normal function. The left ventricle has no regional wall motion abnormalities. Left ventricular diastolic parameters were normal.  2. Right ventricular systolic function is normal. The right ventricular size is normal.  3. Left atrial size was mildly dilated.  4. The mitral valve is normal in structure. Trivial mitral valve regurgitation. No evidence of mitral stenosis.  5. The aortic valve is normal in structure. Aortic valve regurgitation is not visualized. No aortic stenosis is present.  6. The inferior vena cava is normal in size with greater than 50% respiratory variability, suggesting right atrial pressure of 3 mmHg. FINDINGS  Left Ventricle: Left ventricular ejection fraction, by estimation, is 55 to 60%. The left ventricle has normal function. The left ventricle has no regional wall motion abnormalities. The left ventricular internal cavity size was normal in size. There is  no left ventricular hypertrophy. Left  ventricular diastolic parameters were normal. Right Ventricle: The right ventricular size is normal. No increase in right ventricular wall thickness. Right ventricular systolic function is normal. Left Atrium: Left atrial size was mildly dilated. Right Atrium: Right atrial size was normal in size. Pericardium: There is no evidence of pericardial effusion. Mitral Valve: The mitral valve is normal in structure. Mild mitral annular calcification. Trivial mitral valve regurgitation. No evidence of mitral valve stenosis. Tricuspid Valve: The tricuspid valve is normal in structure. Tricuspid valve regurgitation is mild . No evidence of tricuspid stenosis. Aortic Valve: The aortic valve is normal in structure. Aortic valve regurgitation is not visualized. No aortic stenosis is present. Pulmonic Valve: The pulmonic valve was normal in structure. Pulmonic valve regurgitation is not visualized. No evidence of pulmonic stenosis. Aorta: The aortic root is normal in size and structure. Venous: The inferior vena cava is normal in size with greater than 50% respiratory variability, suggesting right atrial pressure of 3 mmHg. IAS/Shunts: No atrial level shunt detected by color flow Doppler.  LEFT VENTRICLE PLAX 2D LVOT diam:     2.20 cm   Diastology LV SV:         102       LV e' medial:    10.10 cm/s LV SV Index:   48        LV E/e' medial:  11.7 LVOT Area:     3.80 cm  LV e' lateral:   10.10 cm/s                          LV E/e' lateral: 11.7                           3D Volume EF:  3D EF:        62 %                          LV EDV:       185 ml                          LV ESV:       71 ml                          LV SV:        114 ml RIGHT VENTRICLE             IVC RV S prime:     13.60 cm/s  IVC diam: 2.40 cm TAPSE (M-mode): 2.3 cm LEFT ATRIUM             Index        RIGHT ATRIUM           Index LA Vol (A2C):   57.5 ml 26.87 ml/m  RA Area:     15.15 cm LA Vol (A4C):   55.1 ml 25.75 ml/m  RA Volume:    33.30 ml  15.56 ml/m LA Biplane Vol: 58.7 ml 27.43 ml/m  AORTIC VALVE LVOT Vmax:   134.67 cm/s LVOT Vmean:  90.300 cm/s LVOT VTI:    0.268 m  AORTA Ao Root diam: 3.40 cm Ao Asc diam:  3.50 cm MITRAL VALVE                TRICUSPID VALVE MV Area (PHT): 4.21 cm     TR Peak grad:   28.5 mmHg MV Decel Time: 180 msec     TR Vmax:        267.00 cm/s MV E velocity: 118.00 cm/s MV A velocity: 83.20 cm/s   SHUNTS MV E/A ratio:  1.42         Systemic VTI:  0.27 m                             Systemic Diam: 2.20 cm Glori Bickers MD Electronically signed by Glori Bickers MD Signature Date/Time: 06/25/2022/4:24:47 PM    Final    CT Angio Chest PE W and/or Wo Contrast  Result Date: 06/24/2022 CLINICAL DATA:  Pulmonary embolism (PE) suspected, high prob Shortness of breath. EXAM: CT ANGIOGRAPHY CHEST WITH CONTRAST TECHNIQUE: Multidetector CT imaging of the chest was performed using the standard protocol during bolus administration of intravenous contrast. Multiplanar CT image reconstructions and MIPs were obtained to evaluate the vascular anatomy. RADIATION DOSE REDUCTION: This exam was performed according to the departmental dose-optimization program which includes automated exposure control, adjustment of the mA and/or kV according to patient size and/or use of iterative reconstruction technique. CONTRAST:  19m OMNIPAQUE IOHEXOL 350 MG/ML SOLN COMPARISON:  Radiograph earlier today. Chest CTA 09/23/2016 FINDINGS: Cardiovascular: Technically limited exam due to breathing motion, streak artifact from spinal and right arm hardware and soft tissue attenuation from habitus. Allowing for these limitations, there is no obvious pulmonary embolus. Normal caliber thoracic aorta without dissection. Heart is upper normal in size. No pericardial effusion. Mediastinum/Nodes: Scattered small mediastinal and bilateral hilar lymph nodes, not enlarged by size criteria. Minimal hiatal hernia. Lungs/Pleura: Small to moderate bilateral  pleural effusions with adjacent compressive atelectasis. Smooth septal thickening and ill-defined ground-glass typical of pulmonary  edema. No pneumothorax. Upper Abdomen: No acute findings. Large volume of stool in the included colon. Musculoskeletal: Reverse right shoulder arthroplasty. Extensive posterior thoracolumbar fusion hardware. Anterior fusion hardware in the lower cervical spine. No acute findings. Review of the MIP images confirms the above findings. IMPRESSION: 1. No obvious pulmonary embolus allowing for limitations as described above. 2. Small to moderate bilateral pleural effusions with adjacent compressive atelectasis. Smooth septal thickening and ill-defined ground-glass typical of pulmonary edema. Electronically Signed   By: Keith Rake M.D.   On: 06/24/2022 17:53   VAS [place] LOWER EXTREMITY VENOUS (DVT)  Result Date: 06/24/2022  Lower Venous DVT Study Patient Name:  ANAIDA CEJA  Date of Exam:   06/24/2022 Medical Rec #: [id number]         Accession #:    [id number] Date of Birth: 01-03-1966         Patient Gender: F Patient Age:   43 years Exam Location:  [facility] Procedure:      VAS [place] LOWER EXTREMITY VENOUS (DVT) Referring Phys: Davonna Belling --------------------------------------------------------------------------------  Indications: Pitting edema, and Pain.  Comparison Study: No prior study on file Performing Technologist: Sharion Dove RVS  Examination Guidelines: A complete evaluation includes B-mode imaging, spectral Doppler, color Doppler, and power Doppler as needed of all accessible portions of each vessel. Bilateral testing is considered an integral part of a complete examination. Limited examinations for reoccurring indications may be performed as noted. The reflux portion of the exam is performed with the patient in reverse Trendelenburg.  +-----+---------------+---------+-----------+----------+--------------+  RIGHTCompressibilityPhasicitySpontaneityPropertiesThrombus Aging +-----+---------------+---------+-----------+----------+--------------+ CFV  Full           Yes      Yes                                 +-----+---------------+---------+-----------+----------+--------------+   +---------+---------------+---------+-----------+----------+---------------+ LEFT     CompressibilityPhasicitySpontaneityPropertiesThrombus Aging  +---------+---------------+---------+-----------+----------+---------------+ CFV      Full           Yes      Yes                                  +---------+---------------+---------+-----------+----------+---------------+ SFJ      Full                                                         +---------+---------------+---------+-----------+----------+---------------+ FV Prox  Full                                                         +---------+---------------+---------+-----------+----------+---------------+ FV Mid   Full           Yes      Yes                                  +---------+---------------+---------+-----------+----------+---------------+ FV DistalFull                                                         +---------+---------------+---------+-----------+----------+---------------+  PFV      Full                                                         +---------+---------------+---------+-----------+----------+---------------+ POP      Full           Yes      Yes                                  +---------+---------------+---------+-----------+----------+---------------+ PTV                                                   patent by color +---------+---------------+---------+-----------+----------+---------------+ PERO                                                  patent by color +---------+---------------+---------+-----------+----------+---------------+    Summary: RIGHT: - No evidence of common  femoral vein obstruction.  LEFT: - There is no evidence of deep vein thrombosis in the lower extremity.  - No cystic structure found in the popliteal fossa.  *See table(s) above for measurements and observations. Electronically signed by Orlie Pollen on 06/24/2022 at 4:17:52 PM.    Final    DG Chest 2 View  Result Date: 06/24/2022 CLINICAL DATA:  Shortness of breath. EXAM: CHEST - 2 VIEW COMPARISON:  May 22, 2022. FINDINGS: Stable cardiomediastinal silhouette. Status post surgical posterior fusion of thoracic spine. Minimal bibasilar subsegmental atelectasis or edema is noted with small pleural effusions. IMPRESSION: Minimal bibasilar subsegmental atelectasis or edema with small pleural effusions. Electronically Signed   By: Marijo Conception M.D.   On: 06/24/2022 12:34      LOS: 2 days    Flora Lipps, MD [facility] Available via Epic secure chat 7am-7pm After these hours, please refer to coverage provider listed on [email or web address] 06/26/2022, 11:05 AM

## 2022-06-26 NOTE — Progress Notes (Signed)
DAILY PROGRESS NOTE   Patient Name: [name] [name] Date of Encounter: 06/26/2022 Cardiologist: Quay Burow, MD  Chief Complaint   Swelling is better  Patient Profile   [name] [name] is a 57 y.o. female with a hx of hypertension, septic shock secondary to R foot abscess, sleep apnea, bipolar, asthma, prediabetes who is being seen 06/25/2022 for the evaluation of acute congestive heart failure at the request of Dr. Louanne Belton.   Subjective   Has been diuresed 4.7L negative- with 3L negative overnight. Echo yesterday showed normal systolic and diastolic function, therefore, edema is likely non-cardiac. Creatinine has increased to 1.23 from 0.95 overnight. Potassium 3.4 today. Currently has a migraine headache.  Objective   Vitals:   06/25/22 2032 06/26/22 0004 06/26/22 0519 06/26/22 0802  BP:  (!) 140/80 (!) 148/76 (!) 141/85  Pulse: 78 81 85 77  Resp: '17 17 17 18  '$ Temp: 98.5 F (36.9 C) 98.2 F (36.8 C) 98 F (36.7 C) 98 F (36.7 C)  TempSrc: Oral Oral Oral Oral  SpO2:  96% 93% 97%  Weight:  106.6 kg    Height:        Intake/Output Summary (Last 24 hours) at 06/26/2022 [id number] Last data filed at 06/26/2022 0524 Gross per 24 hour  Intake 240 ml  Output 3400 ml  Net -3160 ml   Filed Weights   06/24/22 1204 06/25/22 0431 06/26/22 0004  Weight: 119.3 kg 112 kg 106.6 kg    Physical Exam   General appearance: alert and mild distress Lungs: clear to auscultation bilaterally Heart: regular rate and rhythm Extremities: edema trace LLE edema, right TMA noted Neurologic: Grossly normal  Inpatient Medications    Scheduled Meds:  busPIRone  15 mg Oral TID   DULoxetine  20 mg Oral Daily   enoxaparin (LOVENOX) injection  60 mg Subcutaneous Q24H   escitalopram  10 mg Oral Daily   ferrous sulfate  325 mg Oral QHS   fesoterodine  4 mg Oral Daily   furosemide  40 mg Intravenous Q12H   haloperidol  5 mg Oral QHS   lamoTRIgine  100 mg Oral Daily   losartan  50 mg Oral  Daily   Methylnaltrexone Bromide  450 mg Oral q morning   multivitamin with minerals  1 tablet Oral Daily   pantoprazole  40 mg Oral BID   potassium chloride  40 mEq Oral Once   pregabalin  100 mg Oral TID   sodium chloride flush  3 mL Intravenous Q12H   spironolactone  25 mg Oral Daily   SUMAtriptan  50 mg Oral Once   topiramate  50 mg Oral BID    Continuous Infusions:  sodium chloride      PRN Meds: sodium chloride, acetaminophen **OR** acetaminophen, albuterol, ondansetron **OR** ondansetron (ZOFRAN) IV, oxyCODONE-acetaminophen **AND** oxyCODONE, promethazine, sodium chloride flush, traZODone   Labs   Results for orders placed or performed during the hospital encounter of 06/24/22 (from the past 48 hour(s))  Brain natriuretic peptide     Status: Abnormal   Collection Time: 06/24/22 12:19 PM  Result Value Ref Range   B Natriuretic Peptide 247.5 (H) 0.0 - 100.0 pg/mL    Comment: Performed at [facility], [address], [place], [place] [address]  CBC with Differential     Status: Abnormal   Collection Time: 06/24/22 12:19 PM  Result Value Ref Range   WBC 4.4 4.0 - 10.5 K/uL   RBC 2.68 (L) 3.87 - 5.11  MIL/uL   Hemoglobin 8.5 (L) 12.0 - 15.0 g/dL   HCT 26.6 (L) 36.0 - 46.0 %   MCV 99.3 80.0 - 100.0 fL   MCH 31.7 26.0 - 34.0 pg   MCHC 32.0 30.0 - 36.0 g/dL   RDW 14.8 11.5 - 15.5 %   Platelets 245 150 - 400 K/uL   nRBC 0.0 0.0 - 0.2 %   Neutrophils Relative % 63 %   Neutro Abs 2.7 1.7 - 7.7 K/uL   Lymphocytes Relative 29 %   Lymphs Abs 1.3 0.7 - 4.0 K/uL   Monocytes Relative 6 %   Monocytes Absolute 0.3 0.1 - 1.0 K/uL   Eosinophils Relative 2 %   Eosinophils Absolute 0.1 0.0 - 0.5 K/uL   Basophils Relative 0 %   Basophils Absolute 0.0 0.0 - 0.1 K/uL   Immature Granulocytes 0 %   Abs Immature Granulocytes 0.01 0.00 - 0.07 K/uL    Comment: Performed at [facility] [address] [place], [place] [address]  Troponin I (High Sensitivity)     Status: None    Collection Time: 06/24/22 12:19 PM  Result Value Ref Range   Troponin I (High Sensitivity) 4 <18 ng/L    Comment: (NOTE) Elevated high sensitivity troponin I (hsTnI) values and significant  changes across serial measurements may suggest ACS but many other  chronic and acute conditions are known to elevate hsTnI results.  Refer to the "Links" section for chest pain algorithms and additional  guidance. Performed at [facility], [place] [address], [place], [place] [id number]   Basic metabolic panel     Status: Abnormal   Collection Time: 06/24/22 12:19 PM  Result Value Ref Range   Sodium 140 135 - 145 mmol/L   Potassium 4.0 3.5 - 5.1 mmol/L   Chloride 105 98 - 111 mmol/L   CO2 25 22 - 32 mmol/L   Glucose, Bld 103 (H) 70 - 99 mg/dL    Comment: Glucose reference range applies only to samples taken after fasting for at least 8 hours.   BUN 24 (H) 6 - 20 mg/dL   Creatinine, Ser 0.98 0.44 - 1.00 mg/dL   Calcium 9.3 8.9 - 10.3 mg/dL   GFR, Estimated >60 >60 mL/min    Comment: (NOTE) Calculated using the CKD-EPI Creatinine Equation (2021)    Anion gap 10 5 - 15    Comment: Performed at [facility] [address], [place], [place] [address]  Troponin I (High Sensitivity)     Status: None   Collection Time: 06/24/22  4:32 PM  Result Value Ref Range   Troponin I (High Sensitivity) 5 <18 ng/L    Comment: (NOTE) Elevated high sensitivity troponin I (hsTnI) values and significant  changes across serial measurements may suggest ACS but many other  chronic and acute conditions are known to elevate hsTnI results.  Refer to the "Links" section for chest pain algorithms and additional  guidance. Performed at [facility], [place] [address] [place], [place] [address]   CBC     Status: Abnormal   Collection Time: 06/25/22 12:53 AM  Result Value Ref Range   WBC 5.8 4.0 - 10.5 K/uL   RBC 2.38 (L) 3.87 - 5.11 MIL/uL   Hemoglobin 7.5 (L) 12.0 - 15.0 g/dL   HCT 23.3 (L) 36.0 -  46.0 %   MCV 97.9 80.0 - 100.0 fL   MCH 31.5 26.0 - 34.0 pg   MCHC 32.2  30.0 - 36.0 g/dL   RDW 14.8 11.5 - 15.5 %   Platelets 230 150 - 400 K/uL   nRBC 0.0 0.0 - 0.2 %    Comment: Performed at [facility], [place] [address] [place], [place] [id number]  Basic metabolic panel     Status: Abnormal   Collection Time: 06/25/22 12:53 AM  Result Value Ref Range   Sodium 140 135 - 145 mmol/L   Potassium 3.6 3.5 - 5.1 mmol/L   Chloride 108 98 - 111 mmol/L   CO2 27 22 - 32 mmol/L   Glucose, Bld 94 70 - 99 mg/dL    Comment: Glucose reference range applies only to samples taken after fasting for at least 8 hours.   BUN 17 6 - 20 mg/dL   Creatinine, Ser 0.95 0.44 - 1.00 mg/dL   Calcium 8.8 (L) 8.9 - 10.3 mg/dL   GFR, Estimated >60 >60 mL/min    Comment: (NOTE) Calculated using the CKD-EPI Creatinine Equation (2021)    Anion gap 5 5 - 15    Comment: Performed at [facility] [address], [place], [place] [address]  MRSA Next Gen by PCR, Nasal     Status: None   Collection Time: 06/25/22  9:05 AM   Specimen: Nasal Mucosa; Nasal Swab  Result Value Ref Range   MRSA by PCR Next Gen NOT DETECTED NOT DETECTED    Comment: (NOTE) The GeneXpert MRSA Assay (FDA approved for NASAL specimens only), is one component of a comprehensive MRSA colonization surveillance program. It is not intended to diagnose MRSA infection nor to guide or monitor treatment for MRSA infections. Test performance is not FDA approved in patients less than 63 years old. Performed at [facility], [place] [address] [place], [place] [address]   Vitamin B12     Status: None   Collection Time: 06/25/22  9:31 AM  Result Value Ref Range   Vitamin B-12 575 180 - 914 pg/mL    Comment: (NOTE) This assay is not validated for testing neonatal or myeloproliferative syndrome specimens for Vitamin B12 levels. Performed at [facility], [place] [address], [place], [place] [address]   Folate     Status: None    Collection Time: 06/25/22  9:31 AM  Result Value Ref Range   Folate 24.4 >5.9 ng/mL    Comment: Performed at [facility] [address], [place], [place] [address]  Iron and TIBC     Status: None   Collection Time: 06/25/22  9:31 AM  Result Value Ref Range   Iron 87 28 - 170 ug/dL   TIBC 328 250 - 450 ug/dL   Saturation Ratios 27 10.4 - 31.8 %   UIBC 241 ug/dL    Comment: Performed at [facility], [place] [address], [place], [place] [address]  Ferritin     Status: None   Collection Time: 06/25/22  9:31 AM  Result Value Ref Range   Ferritin 106 11 - 307 ng/mL    Comment: Performed at [facility], [place] [address], [place], [place] [address]  Reticulocytes     Status: Abnormal   Collection Time: 06/25/22  9:31 AM  Result Value Ref Range   Retic Ct Pct 2.1 0.4 - 3.1 %   RBC. 2.76 (L) 3.87 - 5.11 MIL/uL   Retic Count, Absolute 57.4 19.0 - 186.0 K/uL   Immature Retic Fract 8.4 2.3 - 15.9 %  Comment: Performed at [facility], [place] [address], [place], [place] [id number]  Basic metabolic panel     Status: Abnormal   Collection Time: 06/26/22 12:56 AM  Result Value Ref Range   Sodium 138 135 - 145 mmol/L   Potassium 3.4 (L) 3.5 - 5.1 mmol/L   Chloride 103 98 - 111 mmol/L   CO2 28 22 - 32 mmol/L   Glucose, Bld 108 (H) 70 - 99 mg/dL    Comment: Glucose reference range applies only to samples taken after fasting for at least 8 hours.   BUN 17 6 - 20 mg/dL   Creatinine, Ser 1.23 (H) 0.44 - 1.00 mg/dL   Calcium 8.8 (L) 8.9 - 10.3 mg/dL   GFR, Estimated 51 (L) >60 mL/min    Comment: (NOTE) Calculated using the CKD-EPI Creatinine Equation (2021)    Anion gap 7 5 - 15    Comment: Performed at [facility] [address], [place], [place] [address]  CBC     Status: Abnormal   Collection Time: 06/26/22 12:56 AM  Result Value Ref Range   WBC 6.9 4.0 - 10.5 K/uL   RBC 2.59 (L) 3.87 - 5.11 MIL/uL   Hemoglobin 8.0 (L) 12.0 - 15.0 g/dL   HCT 25.1 (L) 36.0 -  46.0 %   MCV 96.9 80.0 - 100.0 fL   MCH 30.9 26.0 - 34.0 pg   MCHC 31.9 30.0 - 36.0 g/dL   RDW 14.8 11.5 - 15.5 %   Platelets 259 150 - 400 K/uL   nRBC 0.0 0.0 - 0.2 %    Comment: Performed at [facility], [place] [address] [place], [place] [address]  Magnesium     Status: None   Collection Time: 06/26/22 12:56 AM  Result Value Ref Range   Magnesium 2.0 1.7 - 2.4 mg/dL    Comment: Performed at [facility] [address] [place], [place] [address]   *Note: Due to a large number of results and/or encounters for the requested time period, some results have not been displayed. A complete set of results can be found in Results Review.    ECG   N/A - Personally Reviewed  Telemetry   Sinus rhythm - Personally Reviewed  Radiology    ECHOCARDIOGRAM COMPLETE  Result Date: 06/25/2022    ECHOCARDIOGRAM REPORT   Patient Name:   ANNALIZE CLEGHORN Date of Exam: 06/25/2022 Medical Rec #:  [id number]        Height:       64.0 in Accession #:    [id number]       Weight:       246.9 lb Date of Birth:  November 17, 1965        BSA:          2.140 m Patient Age:    43 years         BP:           158/92 mmHg Patient Gender: F                HR:           57 bpm. Exam Location:  Inpatient Procedure: 2D Echo, Color Doppler and Cardiac Doppler Indications:    congestive heart failure  History:        Patient has prior history of Echocardiogram examinations, most                 recent 08/26/2019. CHF, Arrythmias:LBBB;  Signs/Symptoms:Shortness                 of Breath.  Sonographer:    Ronny Flurry Referring Phys: [id number] [place]  1. Left ventricular ejection fraction, by estimation, is 55 to 60%. The left ventricle has normal function. The left ventricle has no regional wall motion abnormalities. Left ventricular diastolic parameters were normal.  2. Right ventricular systolic function is normal. The right ventricular size is normal.  3. Left atrial size was mildly dilated.  4. The mitral  valve is normal in structure. Trivial mitral valve regurgitation. No evidence of mitral stenosis.  5. The aortic valve is normal in structure. Aortic valve regurgitation is not visualized. No aortic stenosis is present.  6. The inferior vena cava is normal in size with greater than 50% respiratory variability, suggesting right atrial pressure of 3 mmHg. FINDINGS  Left Ventricle: Left ventricular ejection fraction, by estimation, is 55 to 60%. The left ventricle has normal function. The left ventricle has no regional wall motion abnormalities. The left ventricular internal cavity size was normal in size. There is  no left ventricular hypertrophy. Left ventricular diastolic parameters were normal. Right Ventricle: The right ventricular size is normal. No increase in right ventricular wall thickness. Right ventricular systolic function is normal. Left Atrium: Left atrial size was mildly dilated. Right Atrium: Right atrial size was normal in size. Pericardium: There is no evidence of pericardial effusion. Mitral Valve: The mitral valve is normal in structure. Mild mitral annular calcification. Trivial mitral valve regurgitation. No evidence of mitral valve stenosis. Tricuspid Valve: The tricuspid valve is normal in structure. Tricuspid valve regurgitation is mild . No evidence of tricuspid stenosis. Aortic Valve: The aortic valve is normal in structure. Aortic valve regurgitation is not visualized. No aortic stenosis is present. Pulmonic Valve: The pulmonic valve was normal in structure. Pulmonic valve regurgitation is not visualized. No evidence of pulmonic stenosis. Aorta: The aortic root is normal in size and structure. Venous: The inferior vena cava is normal in size with greater than 50% respiratory variability, suggesting right atrial pressure of 3 mmHg. IAS/Shunts: No atrial level shunt detected by color flow Doppler.  LEFT VENTRICLE PLAX 2D LVOT diam:     2.20 cm   Diastology LV SV:         102       LV e'  medial:    10.10 cm/s LV SV Index:   48        LV E/e' medial:  11.7 LVOT Area:     3.80 cm  LV e' lateral:   10.10 cm/s                          LV E/e' lateral: 11.7                           3D Volume EF:                          3D EF:        62 %                          LV EDV:       185 ml  LV ESV:       71 ml                          LV SV:        114 ml RIGHT VENTRICLE             IVC RV S prime:     13.60 cm/s  IVC diam: 2.40 cm TAPSE (M-mode): 2.3 cm LEFT ATRIUM             Index        RIGHT ATRIUM           Index LA Vol (A2C):   57.5 ml 26.87 ml/m  RA Area:     15.15 cm LA Vol (A4C):   55.1 ml 25.75 ml/m  RA Volume:   33.30 ml  15.56 ml/m LA Biplane Vol: 58.7 ml 27.43 ml/m  AORTIC VALVE LVOT Vmax:   134.67 cm/s LVOT Vmean:  90.300 cm/s LVOT VTI:    0.268 m  AORTA Ao Root diam: 3.40 cm Ao Asc diam:  3.50 cm MITRAL VALVE                TRICUSPID VALVE MV Area (PHT): 4.21 cm     TR Peak grad:   28.5 mmHg MV Decel Time: 180 msec     TR Vmax:        267.00 cm/s MV E velocity: 118.00 cm/s MV A velocity: 83.20 cm/s   SHUNTS MV E/A ratio:  1.42         Systemic VTI:  0.27 m                             Systemic Diam: 2.20 cm Glori Bickers MD Electronically signed by Glori Bickers MD Signature Date/Time: 06/25/2022/4:24:47 PM    Final    CT Angio Chest PE W and/or Wo Contrast  Result Date: 06/24/2022 CLINICAL DATA:  Pulmonary embolism (PE) suspected, high prob Shortness of breath. EXAM: CT ANGIOGRAPHY CHEST WITH CONTRAST TECHNIQUE: Multidetector CT imaging of the chest was performed using the standard protocol during bolus administration of intravenous contrast. Multiplanar CT image reconstructions and MIPs were obtained to evaluate the vascular anatomy. RADIATION DOSE REDUCTION: This exam was performed according to the departmental dose-optimization program which includes automated exposure control, adjustment of the mA and/or kV according to patient size and/or use of  iterative reconstruction technique. CONTRAST:  27m OMNIPAQUE IOHEXOL 350 MG/ML SOLN COMPARISON:  Radiograph earlier today. Chest CTA 09/23/2016 FINDINGS: Cardiovascular: Technically limited exam due to breathing motion, streak artifact from spinal and right arm hardware and soft tissue attenuation from habitus. Allowing for these limitations, there is no obvious pulmonary embolus. Normal caliber thoracic aorta without dissection. Heart is upper normal in size. No pericardial effusion. Mediastinum/Nodes: Scattered small mediastinal and bilateral hilar lymph nodes, not enlarged by size criteria. Minimal hiatal hernia. Lungs/Pleura: Small to moderate bilateral pleural effusions with adjacent compressive atelectasis. Smooth septal thickening and ill-defined ground-glass typical of pulmonary edema. No pneumothorax. Upper Abdomen: No acute findings. Large volume of stool in the included colon. Musculoskeletal: Reverse right shoulder arthroplasty. Extensive posterior thoracolumbar fusion hardware. Anterior fusion hardware in the lower cervical spine. No acute findings. Review of the MIP images confirms the above findings. IMPRESSION: 1. No obvious pulmonary embolus allowing for limitations as described above. 2. Small to moderate bilateral pleural effusions with adjacent compressive atelectasis. Smooth septal thickening  and ill-defined ground-glass typical of pulmonary edema. Electronically Signed   By: Keith Rake M.D.   On: 06/24/2022 17:53   VAS [place] LOWER EXTREMITY VENOUS (DVT)  Result Date: 06/24/2022  Lower Venous DVT Study Patient Name:  AYEZA DOBBYN  Date of Exam:   06/24/2022 Medical Rec #: [id number]         Accession #:    [id number] Date of Birth: June 07, 1965         Patient Gender: F Patient Age:   4 years Exam Location:  [facility] Procedure:      VAS [place] LOWER EXTREMITY VENOUS (DVT) Referring Phys: Davonna Belling  --------------------------------------------------------------------------------  Indications: Pitting edema, and Pain.  Comparison Study: No prior study on file Performing Technologist: Sharion Dove RVS  Examination Guidelines: A complete evaluation includes B-mode imaging, spectral Doppler, color Doppler, and power Doppler as needed of all accessible portions of each vessel. Bilateral testing is considered an integral part of a complete examination. Limited examinations for reoccurring indications may be performed as noted. The reflux portion of the exam is performed with the patient in reverse Trendelenburg.  +-----+---------------+---------+-----------+----------+--------------+ RIGHTCompressibilityPhasicitySpontaneityPropertiesThrombus Aging +-----+---------------+---------+-----------+----------+--------------+ CFV  Full           Yes      Yes                                 +-----+---------------+---------+-----------+----------+--------------+   +---------+---------------+---------+-----------+----------+---------------+ LEFT     CompressibilityPhasicitySpontaneityPropertiesThrombus Aging  +---------+---------------+---------+-----------+----------+---------------+ CFV      Full           Yes      Yes                                  +---------+---------------+---------+-----------+----------+---------------+ SFJ      Full                                                         +---------+---------------+---------+-----------+----------+---------------+ FV Prox  Full                                                         +---------+---------------+---------+-----------+----------+---------------+ FV Mid   Full           Yes      Yes                                  +---------+---------------+---------+-----------+----------+---------------+ FV DistalFull                                                          +---------+---------------+---------+-----------+----------+---------------+ PFV      Full                                                         +---------+---------------+---------+-----------+----------+---------------+  POP      Full           Yes      Yes                                  +---------+---------------+---------+-----------+----------+---------------+ PTV                                                   patent by color +---------+---------------+---------+-----------+----------+---------------+ PERO                                                  patent by color +---------+---------------+---------+-----------+----------+---------------+    Summary: RIGHT: - No evidence of common femoral vein obstruction.  LEFT: - There is no evidence of deep vein thrombosis in the lower extremity.  - No cystic structure found in the popliteal fossa.  *See table(s) above for measurements and observations. Electronically signed by Orlie Pollen on 06/24/2022 at 4:17:52 PM.    Final    DG Chest 2 View  Result Date: 06/24/2022 CLINICAL DATA:  Shortness of breath. EXAM: CHEST - 2 VIEW COMPARISON:  May 22, 2022. FINDINGS: Stable cardiomediastinal silhouette. Status post surgical posterior fusion of thoracic spine. Minimal bibasilar subsegmental atelectasis or edema is noted with small pleural effusions. IMPRESSION: Minimal bibasilar subsegmental atelectasis or edema with small pleural effusions. Electronically Signed   By: Marijo Conception M.D.   On: 06/24/2022 12:34    Cardiac Studies   N/A  Assessment   Principal Problem:   CHF (congestive heart failure) ([name]) Active Problems:   Essential hypertension   Shortness of breath   Sleep apnea   Morbid obesity ([name])   Anemia   Plan   Excellent diuresis overnight- given lasix early this am. Will stop IV lasix given worsening creatinine. Replete potassium. Likely switch to oral diuretics tomorrow.  Time Spent Directly with  Patient:  I have spent a total of 25 minutes with the patient reviewing hospital notes, telemetry, EKGs, labs and examining the patient as well as establishing an assessment and plan that was discussed personally with the patient.  > 50% of time was spent in direct patient care.  Length of Stay:  LOS: 2 days   Pixie Casino, MD, [facility] [place] Director of the Advanced Lipid Disorders &  Cardiovascular Risk Reduction Clinic Diplomate of the American Board of Clinical Lipidology Attending Cardiologist  Direct Dial: [phone]  Fax: [phone]  Website:  [email or web address]  Nadean Corwin Tim [place] 06/26/2022, 9:18 AM

## 2022-06-27 DIAGNOSIS — G4730 Sleep apnea, unspecified: Secondary | ICD-10-CM

## 2022-06-27 DIAGNOSIS — I5031 Acute diastolic (congestive) heart failure: Secondary | ICD-10-CM

## 2022-06-27 DIAGNOSIS — R600 Localized edema: Secondary | ICD-10-CM | POA: Insufficient documentation

## 2022-06-27 DIAGNOSIS — D649 Anemia, unspecified: Secondary | ICD-10-CM

## 2022-06-27 LAB — BASIC METABOLIC PANEL
Anion gap: 10 (ref 5–15)
BUN: 17 mg/dL (ref 6–20)
CO2: 27 mmol/L (ref 22–32)
Calcium: 8.6 mg/dL — ABNORMAL LOW (ref 8.9–10.3)
Chloride: 101 mmol/L (ref 98–111)
Creatinine, Ser: 1.13 mg/dL — ABNORMAL HIGH (ref 0.44–1.00)
GFR, Estimated: 56 mL/min — ABNORMAL LOW (ref >60)
Glucose, Bld: 108 mg/dL — ABNORMAL HIGH (ref 70–99)
Potassium: 3.9 mmol/L (ref 3.5–5.1)
Sodium: 138 mmol/L (ref 135–145)

## 2022-06-27 LAB — CBC
HCT: 25.7 % — ABNORMAL LOW (ref 36.0–46.0)
Hemoglobin: 8.4 g/dL — ABNORMAL LOW (ref 12.0–15.0)
MCH: 31.8 pg (ref 26.0–34.0)
MCHC: 32.7 g/dL (ref 30.0–36.0)
MCV: 97.3 fL (ref 80.0–100.0)
Platelets: 268 10*3/uL (ref 150–400)
RBC: 2.64 MIL/uL — ABNORMAL LOW (ref 3.87–5.11)
RDW: 14.8 % (ref 11.5–15.5)
WBC: 6.6 10*3/uL (ref 4.0–10.5)
nRBC: 0 % (ref 0.0–0.2)

## 2022-06-27 LAB — MAGNESIUM: Magnesium: 2.1 mg/dL (ref 1.7–2.4)

## 2022-06-27 MED ORDER — POLYETHYLENE GLYCOL 3350 17 G PO PACK
17.0000 g | PACK | Freq: Once | ORAL | Status: AC
  Administered 2022-06-27: 17 g via ORAL
  Filled 2022-06-27: qty 1

## 2022-06-27 MED ORDER — SPIRONOLACTONE 25 MG PO TABS: 25.0000 mg | ORAL_TABLET | Freq: Every day | ORAL | 2 refills | Status: DC

## 2022-06-27 MED ORDER — LOSARTAN POTASSIUM 50 MG PO TABS: 50.0000 mg | ORAL_TABLET | Freq: Every day | ORAL | 2 refills | Status: DC

## 2022-06-27 MED ORDER — FUROSEMIDE 40 MG PO TABS
40.0000 mg | ORAL_TABLET | Freq: Every day | ORAL | Status: DC
  Administered 2022-06-27: 40 mg via ORAL
  Filled 2022-06-27: qty 1

## 2022-06-27 NOTE — Plan of Care (Signed)

## 2022-06-27 NOTE — Progress Notes (Signed)
Completed ordered Miralax and Soap Suds Enema.  Large stool resulted.

## 2022-06-27 NOTE — Progress Notes (Addendum)
DAILY PROGRESS NOTE   Patient Name: [name] [name] Date of Encounter: 06/27/2022 Cardiologist: Quay Burow, MD  Chief Complaint   Swelling is better  Patient Profile   [name] [name] is a 57 y.o. female with a hx of hypertension, septic shock secondary to R foot abscess, sleep apnea, bipolar, asthma, prediabetes who is being seen 06/25/2022 for the evaluation of acute congestive heart failure at the request of Dr. Louanne Belton.   Subjective   Again negative 1.4L - now 6.1L negative. Creatinine improved after holding diuretics. She appears euvolemic. Echo does not suggest cardiac etiology of edema. She is complaining of abdominal pain after eating - she is on pepcid and protonix. She is also concerned about anemia. Apparently recent CT scan of the abdomen showed some ascites.  Objective   Vitals:   06/26/22 1931 06/27/22 0126 06/27/22 0514 06/27/22 0756  BP: (!) 146/77 139/70 121/71 (!) 149/89  Pulse: 86 72 73 78  Resp: '18 18 18 18  '$ Temp: 98.4 F (36.9 C)  98.4 F (36.9 C) 98 F (36.7 C)  TempSrc: Oral  Oral Oral  SpO2: 98% 97% 93% 96%  Weight:   107.2 kg   Height:        Intake/Output Summary (Last 24 hours) at 06/27/2022 1008 Last data filed at 06/27/2022 0756 Gross per 24 hour  Intake 340 ml  Output 1550 ml  Net -1210 ml   Filed Weights   06/26/22 0004 06/26/22 0811 06/27/22 0514  Weight: 106.6 kg 106.6 kg 107.2 kg    Physical Exam   General appearance: alert and mild distress Lungs: clear to auscultation bilaterally Heart: regular rate and rhythm Extremities: edema none, right TMA Neurologic: Grossly normal  Inpatient Medications    Scheduled Meds:  busPIRone  15 mg Oral TID   DULoxetine  20 mg Oral Daily   enoxaparin (LOVENOX) injection  60 mg Subcutaneous Q24H   escitalopram  10 mg Oral Daily   ferrous sulfate  325 mg Oral QHS   fesoterodine  4 mg Oral Daily   haloperidol  5 mg Oral QHS   lamoTRIgine  100 mg Oral Daily   losartan  50 mg Oral  Daily   Methylnaltrexone Bromide  450 mg Oral q morning   multivitamin with minerals  1 tablet Oral Daily   pantoprazole  40 mg Oral BID   pregabalin  100 mg Oral TID   sodium chloride flush  3 mL Intravenous Q12H   spironolactone  25 mg Oral Daily   topiramate  50 mg Oral BID    Continuous Infusions:  sodium chloride      PRN Meds: sodium chloride, acetaminophen **OR** acetaminophen, albuterol, ondansetron **OR** ondansetron (ZOFRAN) IV, oxyCODONE-acetaminophen **AND** oxyCODONE, promethazine, sodium chloride flush, traZODone   Labs   Results for orders placed or performed during the hospital encounter of 06/24/22 (from the past 48 hour(s))  Vitamin B12     Status: None   Collection Time: 06/25/22  9:31 AM  Result Value Ref Range   Vitamin B-12 575 180 - 914 pg/mL    Comment: (NOTE) This assay is not validated for testing neonatal or myeloproliferative syndrome specimens for Vitamin B12 levels. Performed at [facility], [place] [address] [place], [place] [address]   Folate     Status: None   Collection Time: 06/25/22  9:31 AM  Result Value Ref Range   Folate 24.4 >5.9 ng/mL    Comment: Performed at [facility], [address] [place], [place] [address]  Iron and TIBC     Status: None   Collection Time: 06/25/22  9:31 AM  Result Value Ref Range   Iron 87 28 - 170 ug/dL   TIBC 328 250 - 450 ug/dL   Saturation Ratios 27 10.4 - 31.8 %   UIBC 241 ug/dL    Comment: Performed at [facility], [place] [address] [place], [place] [address]  Ferritin     Status: None   Collection Time: 06/25/22  9:31 AM  Result Value Ref Range   Ferritin 106 11 - 307 ng/mL    Comment: Performed at [facility], [place] [address], [place], [place] [address]  Reticulocytes     Status: Abnormal   Collection Time: 06/25/22  9:31 AM  Result Value Ref Range   Retic Ct Pct 2.1 0.4 - 3.1 %   RBC. 2.76 (L) 3.87 - 5.11 MIL/uL   Retic Count, Absolute 57.4 19.0 - 186.0 K/uL    Immature Retic Fract 8.4 2.3 - 15.9 %    Comment: Performed at [facility] [address], [place], [place] [id number]  Basic metabolic panel     Status: Abnormal   Collection Time: 06/26/22 12:56 AM  Result Value Ref Range   Sodium 138 135 - 145 mmol/L   Potassium 3.4 (L) 3.5 - 5.1 mmol/L   Chloride 103 98 - 111 mmol/L   CO2 28 22 - 32 mmol/L   Glucose, Bld 108 (H) 70 - 99 mg/dL    Comment: Glucose reference range applies only to samples taken after fasting for at least 8 hours.   BUN 17 6 - 20 mg/dL   Creatinine, Ser 1.23 (H) 0.44 - 1.00 mg/dL   Calcium 8.8 (L) 8.9 - 10.3 mg/dL   GFR, Estimated 51 (L) >60 mL/min    Comment: (NOTE) Calculated using the CKD-EPI Creatinine Equation (2021)    Anion gap 7 5 - 15    Comment: Performed at [facility] [address], [place], [place] [address]  CBC     Status: Abnormal   Collection Time: 06/26/22 12:56 AM  Result Value Ref Range   WBC 6.9 4.0 - 10.5 K/uL   RBC 2.59 (L) 3.87 - 5.11 MIL/uL   Hemoglobin 8.0 (L) 12.0 - 15.0 g/dL   HCT 25.1 (L) 36.0 - 46.0 %   MCV 96.9 80.0 - 100.0 fL   MCH 30.9 26.0 - 34.0 pg   MCHC 31.9 30.0 - 36.0 g/dL   RDW 14.8 11.5 - 15.5 %   Platelets 259 150 - 400 K/uL   nRBC 0.0 0.0 - 0.2 %    Comment: Performed at [facility], [place] [address], [place], [place] [address]  Magnesium     Status: None   Collection Time: 06/26/22 12:56 AM  Result Value Ref Range   Magnesium 2.0 1.7 - 2.4 mg/dL    Comment: Performed at [facility] [address], [place], [place] [id number]  Basic metabolic panel     Status: Abnormal   Collection Time: 06/27/22 12:43 AM  Result Value Ref Range   Sodium 138 135 - 145 mmol/L   Potassium 3.9 3.5 - 5.1 mmol/L   Chloride 101 98 - 111 mmol/L   CO2 27 22 - 32 mmol/L   Glucose, Bld 108 (H) 70 - 99 mg/dL    Comment: Glucose reference range applies only to samples taken after fasting  for at least 8 hours.   BUN 17 6 - 20 mg/dL   Creatinine, Ser 1.13 (H)  0.44 - 1.00 mg/dL   Calcium 8.6 (L) 8.9 - 10.3 mg/dL   GFR, Estimated 56 (L) >60 mL/min    Comment: (NOTE) Calculated using the CKD-EPI Creatinine Equation (2021)    Anion gap 10 5 - 15    Comment: Performed at [facility] [address] [place], [place] [address]  Magnesium     Status: None   Collection Time: 06/27/22 12:43 AM  Result Value Ref Range   Magnesium 2.1 1.7 - 2.4 mg/dL    Comment: Performed at [facility] [address], [place], [place] [address]  CBC     Status: Abnormal   Collection Time: 06/27/22 12:43 AM  Result Value Ref Range   WBC 6.6 4.0 - 10.5 K/uL   RBC 2.64 (L) 3.87 - 5.11 MIL/uL   Hemoglobin 8.4 (L) 12.0 - 15.0 g/dL   HCT 25.7 (L) 36.0 - 46.0 %   MCV 97.3 80.0 - 100.0 fL   MCH 31.8 26.0 - 34.0 pg   MCHC 32.7 30.0 - 36.0 g/dL   RDW 14.8 11.5 - 15.5 %   Platelets 268 150 - 400 K/uL   nRBC 0.0 0.0 - 0.2 %    Comment: Performed at [facility], [place] [address], [place], [place] [address]   *Note: Due to a large number of results and/or encounters for the requested time period, some results have not been displayed. A complete set of results can be found in Results Review.    ECG   N/A - Personally Reviewed  Telemetry   Sinus rhythm - Personally Reviewed  Radiology    ECHOCARDIOGRAM COMPLETE  Result Date: 06/25/2022    ECHOCARDIOGRAM REPORT   Patient Name:   JAMEA LAT Date of Exam: 06/25/2022 Medical Rec #:  [id number]        Height:       64.0 in Accession #:    [id number]       Weight:       246.9 lb Date of Birth:  Oct 28, 1965        BSA:          2.140 m Patient Age:    65 years         BP:           158/92 mmHg Patient Gender: F                HR:           57 bpm. Exam Location:  Inpatient Procedure: 2D Echo, Color Doppler and Cardiac Doppler Indications:    congestive heart failure  History:        Patient has prior history of Echocardiogram examinations, most                 recent 08/26/2019. CHF, Arrythmias:LBBB;  Signs/Symptoms:Shortness                 of Breath.  Sonographer:    Ronny Flurry Referring Phys: [id number] [place]  1. Left ventricular ejection fraction, by estimation, is 55 to 60%. The left ventricle has normal function. The left ventricle has no regional wall motion abnormalities. Left ventricular diastolic parameters were normal.  2. Right ventricular systolic function is normal. The right ventricular size is normal.  3. Left atrial size was mildly dilated.  4. The mitral valve  is normal in structure. Trivial mitral valve regurgitation. No evidence of mitral stenosis.  5. The aortic valve is normal in structure. Aortic valve regurgitation is not visualized. No aortic stenosis is present.  6. The inferior vena cava is normal in size with greater than 50% respiratory variability, suggesting right atrial pressure of 3 mmHg. FINDINGS  Left Ventricle: Left ventricular ejection fraction, by estimation, is 55 to 60%. The left ventricle has normal function. The left ventricle has no regional wall motion abnormalities. The left ventricular internal cavity size was normal in size. There is  no left ventricular hypertrophy. Left ventricular diastolic parameters were normal. Right Ventricle: The right ventricular size is normal. No increase in right ventricular wall thickness. Right ventricular systolic function is normal. Left Atrium: Left atrial size was mildly dilated. Right Atrium: Right atrial size was normal in size. Pericardium: There is no evidence of pericardial effusion. Mitral Valve: The mitral valve is normal in structure. Mild mitral annular calcification. Trivial mitral valve regurgitation. No evidence of mitral valve stenosis. Tricuspid Valve: The tricuspid valve is normal in structure. Tricuspid valve regurgitation is mild . No evidence of tricuspid stenosis. Aortic Valve: The aortic valve is normal in structure. Aortic valve regurgitation is not visualized. No aortic stenosis is  present. Pulmonic Valve: The pulmonic valve was normal in structure. Pulmonic valve regurgitation is not visualized. No evidence of pulmonic stenosis. Aorta: The aortic root is normal in size and structure. Venous: The inferior vena cava is normal in size with greater than 50% respiratory variability, suggesting right atrial pressure of 3 mmHg. IAS/Shunts: No atrial level shunt detected by color flow Doppler.  LEFT VENTRICLE PLAX 2D LVOT diam:     2.20 cm   Diastology LV SV:         102       LV e' medial:    10.10 cm/s LV SV Index:   48        LV E/e' medial:  11.7 LVOT Area:     3.80 cm  LV e' lateral:   10.10 cm/s                          LV E/e' lateral: 11.7                           3D Volume EF:                          3D EF:        62 %                          LV EDV:       185 ml                          LV ESV:       71 ml                          LV SV:        114 ml RIGHT VENTRICLE             IVC RV S prime:     13.60 cm/s  IVC diam: 2.40 cm TAPSE (M-mode): 2.3 cm LEFT ATRIUM  Index        RIGHT ATRIUM           Index LA Vol (A2C):   57.5 ml 26.87 ml/m  RA Area:     15.15 cm LA Vol (A4C):   55.1 ml 25.75 ml/m  RA Volume:   33.30 ml  15.56 ml/m LA Biplane Vol: 58.7 ml 27.43 ml/m  AORTIC VALVE LVOT Vmax:   134.67 cm/s LVOT Vmean:  90.300 cm/s LVOT VTI:    0.268 m  AORTA Ao Root diam: 3.40 cm Ao Asc diam:  3.50 cm MITRAL VALVE                TRICUSPID VALVE MV Area (PHT): 4.21 cm     TR Peak grad:   28.5 mmHg MV Decel Time: 180 msec     TR Vmax:        267.00 cm/s MV E velocity: 118.00 cm/s MV A velocity: 83.20 cm/s   SHUNTS MV E/A ratio:  1.42         Systemic VTI:  0.27 m                             Systemic Diam: 2.20 cm Glori Bickers MD Electronically signed by Glori Bickers MD Signature Date/Time: 06/25/2022/4:24:47 PM    Final     Cardiac Studies   N/A  Assessment   Principal Problem:   Bilateral leg edema Active Problems:   Essential hypertension   Shortness of  breath   Sleep apnea   Morbid obesity ([name])   Anemia   Plan   Creatinine improved after holding lasix - her echo shows normal systolic and diastolic function. Despite mild BNP elevation, suspect this is not likely to be congestive heart failure. She is complaining of stomach pain, worse after eating. Has also had persistent anemia - ?need for endoscopy  - she is on PPI and H2 blocker. Recent CT scan showed some ascites- ?portal hypertension. Would recommend that she remain on lasix 40 mg daily and aldactone for this.  Ok to d/c home today from cardiology standpoint. Follow-up with Dr. [name] or APP.  Time Spent Directly with Patient:  I have spent a total of 25 minutes with the patient reviewing hospital notes, telemetry, EKGs, labs and examining the patient as well as establishing an assessment and plan that was discussed personally with the patient.  > 50% of time was spent in direct patient care.  Length of Stay:  LOS: 3 days   Pixie Casino, MD, [facility], [place] Director of the [facility]  Card[facility] Diplomate of the American Board of Clinical Lipidology Attending Cardiologist  Direct Dial: [phone]  Fax: [phone]  Website:  [email or web address] Arran Fessel 06/27/2022, 10:08 AM

## 2022-06-27 NOTE — Plan of Care (Signed)
Problem: Education: Goal: Ability to demonstrate management of disease process will improve 06/27/2022 1559 by Reed Breech, RN Outcome: Adequate for Discharge 06/27/2022 1558 by Reed Breech, RN Outcome: Adequate for Discharge Goal: Ability to verbalize understanding of medication therapies will improve 06/27/2022 1559 by Reed Breech, RN Outcome: Adequate for Discharge 06/27/2022 1558 by Reed Breech, RN Outcome: Adequate for Discharge Goal: Individualized Educational Video(s) 06/27/2022 1559 by Reed Breech, RN Outcome: Adequate for Discharge 06/27/2022 1558 by Reed Breech, RN Outcome: Adequate for Discharge   Problem: Activity: Goal: Capacity to carry out activities will improve 06/27/2022 1559 by Reed Breech, RN Outcome: Adequate for Discharge 06/27/2022 1558 by Reed Breech, RN Outcome: Adequate for Discharge   Problem: Cardiac: Goal: Ability to achieve and maintain adequate cardiopulmonary perfusion will improve 06/27/2022 1559 by Reed Breech, RN Outcome: Adequate for Discharge 06/27/2022 1558 by Reed Breech, RN Outcome: Adequate for Discharge   Problem: Education: Goal: Knowledge of General Education information will improve Description: Including pain rating scale, medication(s)/side effects and non-pharmacologic comfort measures 06/27/2022 1559 by Reed Breech, RN Outcome: Adequate for Discharge 06/27/2022 1558 by Reed Breech, RN Outcome: Adequate for Discharge   Problem: Health Behavior/Discharge Planning: Goal: Ability to manage health-related needs will improve 06/27/2022 1559 by Reed Breech, RN Outcome: Adequate for Discharge 06/27/2022 1558 by Reed Breech, RN Outcome: Adequate for Discharge   Problem: Clinical Measurements: Goal: Ability to maintain clinical measurements within normal limits will improve 06/27/2022 1559 by  Reed Breech, RN Outcome: Adequate for Discharge 06/27/2022 1558 by Reed Breech, RN Outcome: Adequate for Discharge Goal: Will remain free from infection 06/27/2022 1559 by Reed Breech, RN Outcome: Adequate for Discharge 06/27/2022 1558 by Reed Breech, RN Outcome: Adequate for Discharge Goal: Diagnostic test results will improve 06/27/2022 1559 by Reed Breech, RN Outcome: Adequate for Discharge 06/27/2022 1558 by Reed Breech, RN Outcome: Adequate for Discharge Goal: Respiratory complications will improve 06/27/2022 1559 by Reed Breech, RN Outcome: Adequate for Discharge 06/27/2022 1558 by Reed Breech, RN Outcome: Adequate for Discharge Goal: Cardiovascular complication will be avoided 06/27/2022 1559 by Reed Breech, RN Outcome: Adequate for Discharge 06/27/2022 1558 by Reed Breech, RN Outcome: Adequate for Discharge   Problem: Activity: Goal: Risk for activity intolerance will decrease 06/27/2022 1559 by Reed Breech, RN Outcome: Adequate for Discharge 06/27/2022 1558 by Reed Breech, RN Outcome: Adequate for Discharge   Problem: Nutrition: Goal: Adequate nutrition will be maintained 06/27/2022 1559 by Reed Breech, RN Outcome: Adequate for Discharge 06/27/2022 1558 by Reed Breech, RN Outcome: Adequate for Discharge   Problem: Coping: Goal: Level of anxiety will decrease 06/27/2022 1559 by Reed Breech, RN Outcome: Adequate for Discharge 06/27/2022 1558 by Reed Breech, RN Outcome: Adequate for Discharge   Problem: Elimination: Goal: Will not experience complications related to bowel motility 06/27/2022 1559 by Reed Breech, RN Outcome: Adequate for Discharge 06/27/2022 1558 by Reed Breech, RN Outcome: Adequate for Discharge Goal: Will not experience complications related to urinary  retention 06/27/2022 1559 by Reed Breech, RN Outcome: Adequate for Discharge 06/27/2022 1558 by Reed Breech, RN Outcome: Adequate for Discharge   Problem: Pain Managment: Goal: General experience of comfort will improve 06/27/2022 1559 by Reed Breech, RN Outcome: Adequate for Discharge 06/27/2022 1558 by Reed Breech, RN Outcome: Adequate for Discharge   Problem: Skin Integrity: Goal: Risk for  impaired skin integrity will decrease 06/27/2022 1559 by Reed Breech, RN Outcome: Adequate for Discharge 06/27/2022 1558 by Reed Breech, RN Outcome: Adequate for Discharge

## 2022-06-27 NOTE — Discharge Summary (Signed)
Physician Discharge Summary  [name] [name] [id number] DOB: May 26, 1965 DOA: 06/24/2022  PCP: [name], MD  Admit date: 06/24/2022 Discharge date: 06/27/2022  Admitted From: Home  Discharge disposition: Home with home health  Recommendations for Outpatient Follow-Up:   Follow up with your primary care provider in one week.  Check CBC, BMP, magnesium in the next visit Follow-up with cardiology in  3 weeks for heart failure follow-up.. Follow-up with Dr. Amalia Hailey podiatry in 1 month. Would benefit from getting her CPAP at home as soon as possible.  Discharge Diagnosis:   Principal Problem:   Bilateral leg edema Active Problems:   Essential hypertension   Shortness of breath   Sleep apnea   Morbid obesity ([name])   Anemia  Discharge Condition: Improved.  Diet recommendation: .  Low-sodium diet, fluid restriction 1500 mL/day  Wound care: None.  Code status: Full.  History of Present Illness:   Patient is a 57 years old female with past medical history of asthma, bipolar disorder, hypertension, prediabetes, sleep apnea presents to the hospital with shortness of breath, leg swelling and worsening fatigue with orthopnea and ambulatory dysfunction due to increasing edema.  Had 30 pound weight gain.  Was on Lasix as outpatient with her primary care physician but was not getting better so she decided to come to the hospital.  In the ED, patient had bilateral gross pitting edema and BNP was elevated.  Chest x-ray showed some evidence of congestion.  Patient was then admitted hospital for congestive heart failure, failed outpatient treatment.    Hospital Course:   Following conditions were addressed during hospitalization as listed below,  Acute diastolic heart failure.  Presented with worsening dyspnea, paroxysmal nocturnal dyspnea and crackles on exam with peripheral pitting edema.   Failed outpatient treatment with diuretics.  Elevated BNP.  CTA PE protocol was ordered  from the ED which showed no evidence of pulmonary embolism but small to moderate bilateral pleural effusion with pulmonary edema.   2D echocardiogram shows LV ejection fraction of 55 to 60%. Received IV Lasix 40 mg IV twice daily initially during hospitalization with significant diuresis.  Patient was seen by cardiology as well.  At this time patient has been started on losartan and spironolactone.  Patient will continue Lasix on discharge as well.  Will need to follow-up with primary care physician and cardiology as outpatient for closer monitoring of renal function.      Dyspnea, shortness of breath with leg swelling and left leg pain.. On albuterol at home.  Marland Kitchen  Ultrasound of the lower extremity was negative for DVT.  Edema and dyspnea and symptoms have significantly improved.  Transition plan for oral diuretic from 06/27/2022.     Hypokalemia.  Improved.  Latest potassium of 3.9.   Ambulatory dysfunction due to volume overload.  Received diuretics and will continue oral Lasix on discharge.  Patient was seen by physical therapy during hospitalization recommend home health on discharge.   Recent AKI.  Patient was on meloxicam and spironolactone which was discontinued.  Creatinine on presentation 0.9.  Creatinine has slightly bumped up to 1.2 today.  Will hold off with IV diuretics.  Check BMP in AM.   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  Had a recent prolonged hospitalization secondary to  this problem.  Was seen by podiatry during hospitalization.  Recommend follow-up with Dr. Amalia Hailey podiatry in 1 month.   History of sleep apnea Patient recently had sleep study done but  is yet to receive her CPAP machine.  Continue auto titrating CPAP while in the hospital.   Hypertension as per history. Cardiology saw the patient during hospitalization.  On losartan and spironolactone.  Blood pressure has overall improved.   History of bipolar disorder.   Continue Cymbalta, buspirone, Haldol, Lamictal and Lyrica, trazodone.      History of migraine.  Patient take Aimovig monthly as outpatient including Topamax.  Has recently received Aimovig.  Takes Imitrex at home as needed.    Anemia.  Patient denies any obvious blood loss.  Hemoglobin today at 8.0.  Iron level at 87 ferritin [id number] folic acid 24 vitamin [id number] at 575.  Reticulocyte count 2.1%.  Disposition.  At this time, patient is stable for disposition home with outpatient PCP and cardiology/podiatry follow-up.  Patient will be discharged with home health on discharge.  Medical Consultants:   Cardiology Podiatry  Procedures:    None Subjective:   Today, patient was seen and examined at bedside.  Had some constipation but got better with enema.  Breathing has significantly improved.  Swelling has diminished.  Spoke with cardiology and patient is stable for disposition.  Discharge Exam:   Vitals:   06/27/22 0756 06/27/22 1114  BP: (!) 149/89 (!) 149/83  Pulse: 78 89  Resp: 18 20  Temp: 98 F (36.7 C) 98 F (36.7 C)  SpO2: 96% 96%   Vitals:   06/27/22 0126 06/27/22 0514 06/27/22 0756 06/27/22 1114  BP: 139/70 121/71 (!) 149/89 (!) 149/83  Pulse: 72 73 78 89  Resp: '18 18 18 20  '$ Temp:  98.4 F (36.9 C) 98 F (36.7 C) 98 F (36.7 C)  TempSrc:  Oral Oral Oral  SpO2: 97% 93% 96% 96%  Weight:  107.2 kg    Height:       Body mass index is 40.57 kg/m.  General: Alert awake, not in obvious distress flat affect, morbidly obese built HENT: pupils equally reacting to light,  No scleral pallor or icterus noted. Oral mucosa is moist.  Chest:    Diminished breath sounds bilaterally. No crackles or wheezes.  CVS: S1 &S2 heard. No murmur.  Regular rate and rhythm. Abdomen: Soft, nontender, nondistended.  Bowel sounds are heard.   Extremities: No cyanosis, clubbing or edema.  Peripheral pulses are palpable. Psych: Alert, awake and oriented, flat affect, CNS:  No cranial nerve  deficits.  Power equal in all extremities.   Skin: Warm and dry.  No rashes noted.  The results of significant diagnostics from this hospitalization (including imaging, microbiology, ancillary and laboratory) are listed below for reference.     Diagnostic Studies:   ECHOCARDIOGRAM COMPLETE  Result Date: 06/25/2022    ECHOCARDIOGRAM REPORT   Patient Name:   [name] [name] Date of Exam: 06/25/2022 Medical Rec #:  [id number]        Height:       64.0 in Accession #:    [id number]       Weight:       246.9 lb Date of Birth:  09-26-1965        BSA:          2.140 m Patient Age:    54 years         BP:           158/92 mmHg Patient Gender: F                HR:  57 bpm. Exam Location:  Inpatient Procedure: 2D Echo, Color Doppler and Cardiac Doppler Indications:    congestive heart failure  History:        Patient has prior history of Echocardiogram examinations, most                 recent 08/26/2019. CHF, Arrythmias:LBBB; Signs/Symptoms:Shortness                 of Breath.  Sonographer:    Ronny Flurry Referring Phys: [id number] [place]  1. Left ventricular ejection fraction, by estimation, is 55 to 60%. The left ventricle has normal function. The left ventricle has no regional wall motion abnormalities. Left ventricular diastolic parameters were normal.  2. Right ventricular systolic function is normal. The right ventricular size is normal.  3. Left atrial size was mildly dilated.  4. The mitral valve is normal in structure. Trivial mitral valve regurgitation. No evidence of mitral stenosis.  5. The aortic valve is normal in structure. Aortic valve regurgitation is not visualized. No aortic stenosis is present.  6. The inferior vena cava is normal in size with greater than 50% respiratory variability, suggesting right atrial pressure of 3 mmHg. FINDINGS  Left Ventricle: Left ventricular ejection fraction, by estimation, is 55 to 60%. The left ventricle has normal function. The left  ventricle has no regional wall motion abnormalities. The left ventricular internal cavity size was normal in size. There is  no left ventricular hypertrophy. Left ventricular diastolic parameters were normal. Right Ventricle: The right ventricular size is normal. No increase in right ventricular wall thickness. Right ventricular systolic function is normal. Left Atrium: Left atrial size was mildly dilated. Right Atrium: Right atrial size was normal in size. Pericardium: There is no evidence of pericardial effusion. Mitral Valve: The mitral valve is normal in structure. Mild mitral annular calcification. Trivial mitral valve regurgitation. No evidence of mitral valve stenosis. Tricuspid Valve: The tricuspid valve is normal in structure. Tricuspid valve regurgitation is mild . No evidence of tricuspid stenosis. Aortic Valve: The aortic valve is normal in structure. Aortic valve regurgitation is not visualized. No aortic stenosis is present. Pulmonic Valve: The pulmonic valve was normal in structure. Pulmonic valve regurgitation is not visualized. No evidence of pulmonic stenosis. Aorta: The aortic root is normal in size and structure. Venous: The inferior vena cava is normal in size with greater than 50% respiratory variability, suggesting right atrial pressure of 3 mmHg. IAS/Shunts: No atrial level shunt detected by color flow Doppler.  LEFT VENTRICLE PLAX 2D LVOT diam:     2.20 cm   Diastology LV SV:         102       LV e' medial:    10.10 cm/s LV SV Index:   48        LV E/e' medial:  11.7 LVOT Area:     3.80 cm  LV e' lateral:   10.10 cm/s                          LV E/e' lateral: 11.7                           3D Volume EF:                          3D EF:        62 %  LV EDV:       185 ml                          LV ESV:       71 ml                          LV SV:        114 ml RIGHT VENTRICLE             IVC RV S prime:     13.60 cm/s  IVC diam: 2.40 cm TAPSE (M-mode): 2.3 cm LEFT  ATRIUM             Index        RIGHT ATRIUM           Index LA Vol (A2C):   57.5 ml 26.87 ml/m  RA Area:     15.15 cm LA Vol (A4C):   55.1 ml 25.75 ml/m  RA Volume:   33.30 ml  15.56 ml/m LA Biplane Vol: 58.7 ml 27.43 ml/m  AORTIC VALVE LVOT Vmax:   134.67 cm/s LVOT Vmean:  90.300 cm/s LVOT VTI:    0.268 m  AORTA Ao Root diam: 3.40 cm Ao Asc diam:  3.50 cm MITRAL VALVE                TRICUSPID VALVE MV Area (PHT): 4.21 cm     TR Peak grad:   28.5 mmHg MV Decel Time: 180 msec     TR Vmax:        267.00 cm/s MV E velocity: 118.00 cm/s MV A velocity: 83.20 cm/s   SHUNTS MV E/A ratio:  1.42         Systemic VTI:  0.27 m                             Systemic Diam: 2.20 cm Glori Bickers MD Electronically signed by Glori Bickers MD Signature Date/Time: 06/25/2022/4:24:47 PM    Final    CT Angio Chest PE W and/or Wo Contrast  Result Date: 06/24/2022 CLINICAL DATA:  Pulmonary embolism (PE) suspected, high prob Shortness of breath. EXAM: CT ANGIOGRAPHY CHEST WITH CONTRAST TECHNIQUE: Multidetector CT imaging of the chest was performed using the standard protocol during bolus administration of intravenous contrast. Multiplanar CT image reconstructions and MIPs were obtained to evaluate the vascular anatomy. RADIATION DOSE REDUCTION: This exam was performed according to the departmental dose-optimization program which includes automated exposure control, adjustment of the mA and/or kV according to patient size and/or use of iterative reconstruction technique. CONTRAST:  55m OMNIPAQUE IOHEXOL 350 MG/ML SOLN COMPARISON:  Radiograph earlier today. Chest CTA 09/23/2016 FINDINGS: Cardiovascular: Technically limited exam due to breathing motion, streak artifact from spinal and right arm hardware and soft tissue attenuation from habitus. Allowing for these limitations, there is no obvious pulmonary embolus. Normal caliber thoracic aorta without dissection. Heart is upper normal in size. No pericardial effusion.  Mediastinum/Nodes: Scattered small mediastinal and bilateral hilar lymph nodes, not enlarged by size criteria. Minimal hiatal hernia. Lungs/Pleura: Small to moderate bilateral pleural effusions with adjacent compressive atelectasis. Smooth septal thickening and ill-defined ground-glass typical of pulmonary edema. No pneumothorax. Upper Abdomen: No acute findings. Large volume of stool in the included colon. Musculoskeletal: Reverse right shoulder arthroplasty. Extensive posterior thoracolumbar fusion hardware. Anterior fusion hardware in the lower cervical spine. No acute  findings. Review of the MIP images confirms the above findings. IMPRESSION: 1. No obvious pulmonary embolus allowing for limitations as described above. 2. Small to moderate bilateral pleural effusions with adjacent compressive atelectasis. Smooth septal thickening and ill-defined ground-glass typical of pulmonary edema. Electronically Signed   By: Keith Rake M.D.   On: 06/24/2022 17:53   VAS [place] LOWER EXTREMITY VENOUS (DVT)  Result Date: 06/24/2022  Lower Venous DVT Study Patient Name:  [name] [name]  Date of Exam:   06/24/2022 Medical Rec #: [id number]         Accession #:    [id number] Date of Birth: Aug 01, 1965         Patient Gender: F Patient Age:   33 years Exam Location:  [facility] Procedure:      VAS [place] LOWER EXTREMITY VENOUS (DVT) Referring Phys: Davonna Belling --------------------------------------------------------------------------------  Indications: Pitting edema, and Pain.  Comparison Study: No prior study on file Performing Technologist: Sharion Dove RVS  Examination Guidelines: A complete evaluation includes B-mode imaging, spectral Doppler, color Doppler, and power Doppler as needed of all accessible portions of each vessel. Bilateral testing is considered an integral part of a complete examination. Limited examinations for reoccurring indications may be performed as noted. The reflux portion of the exam is  performed with the patient in reverse Trendelenburg.  +-----+---------------+---------+-----------+----------+--------------+ RIGHTCompressibilityPhasicitySpontaneityPropertiesThrombus Aging +-----+---------------+---------+-----------+----------+--------------+ CFV  Full           Yes      Yes                                 +-----+---------------+---------+-----------+----------+--------------+   +---------+---------------+---------+-----------+----------+---------------+ LEFT     CompressibilityPhasicitySpontaneityPropertiesThrombus Aging  +---------+---------------+---------+-----------+----------+---------------+ CFV      Full           Yes      Yes                                  +---------+---------------+---------+-----------+----------+---------------+ SFJ      Full                                                         +---------+---------------+---------+-----------+----------+---------------+ FV Prox  Full                                                         +---------+---------------+---------+-----------+----------+---------------+ FV Mid   Full           Yes      Yes                                  +---------+---------------+---------+-----------+----------+---------------+ FV DistalFull                                                         +---------+---------------+---------+-----------+----------+---------------+ PFV  Full                                                         +---------+---------------+---------+-----------+----------+---------------+ POP      Full           Yes      Yes                                  +---------+---------------+---------+-----------+----------+---------------+ PTV                                                   patent by color +---------+---------------+---------+-----------+----------+---------------+ PERO                                                  patent by  color +---------+---------------+---------+-----------+----------+---------------+    Summary: RIGHT: - No evidence of common femoral vein obstruction.  LEFT: - There is no evidence of deep vein thrombosis in the lower extremity.  - No cystic structure found in the popliteal fossa.  *See table(s) above for measurements and observations. Electronically signed by Orlie Pollen on 06/24/2022 at 4:17:52 PM.    Final    DG Chest 2 View  Result Date: 06/24/2022 CLINICAL DATA:  Shortness of breath. EXAM: CHEST - 2 VIEW COMPARISON:  May 22, 2022. FINDINGS: Stable cardiomediastinal silhouette. Status post surgical posterior fusion of thoracic spine. Minimal bibasilar subsegmental atelectasis or edema is noted with small pleural effusions. IMPRESSION: Minimal bibasilar subsegmental atelectasis or edema with small pleural effusions. Electronically Signed   By: Marijo Conception M.D.   On: 06/24/2022 12:34     Labs:   Basic Metabolic Panel: Recent Labs  Lab 06/21/22 1132 06/24/22 1219 06/25/22 0053 06/26/22 0056 06/27/22 0043  NA 142 140 140 138 138  K 4.5 4.0 3.6 3.4* 3.9  CL 110 105 108 103 101  CO2 '21 25 27 28 27  '$ GLUCOSE 110* 103* 94 108* 108*  BUN 28* 24* '17 17 17  '$ CREATININE 1.08* 0.98 0.95 1.23* 1.13*  CALCIUM 8.7 9.3 8.8* 8.8* 8.6*  MG  --   --   --  2.0 2.1   GFR Estimated Creatinine Clearance: 64.9 mL/min (A) (by C-G formula based on SCr of 1.13 mg/dL (H)). Liver Function Tests: No results for input(s): "AST", "ALT", "ALKPHOS", "BILITOT", "PROT", "ALBUMIN" in the last 168 hours. No results for input(s): "LIPASE", "AMYLASE" in the last 168 hours. No results for input(s): "AMMONIA" in the last 168 hours. Coagulation profile No results for input(s): "INR", "PROTIME" in the last 168 hours.  CBC: Recent Labs  Lab 06/21/22 1132 06/24/22 1219 06/25/22 0053 06/26/22 0056 06/27/22 0043  WBC 4.7 4.4 5.8 6.9 6.6  NEUTROABS 3,224 2.7  --   --   --   HGB 7.7* 8.5* 7.5* 8.0* 8.4*  HCT  24.3* 26.6* 23.3* 25.1* 25.7*  MCV 97.6 99.3 97.9 96.9 97.3  PLT 226 245 230 259 268   Cardiac Enzymes: No results for input(s): "  CKTOTAL", "CKMB", "CKMBINDEX", "TROPONINI" in the last 168 hours. BNP: Invalid input(s): "POCBNP" CBG: No results for input(s): "GLUCAP" in the last 168 hours. D-Dimer No results for input(s): "DDIMER" in the last 72 hours. Hgb A1c No results for input(s): "HGBA1C" in the last 72 hours. Lipid Profile No results for input(s): "CHOL", "HDL", "LDLCALC", "TRIG", "CHOLHDL", "LDLDIRECT" in the last 72 hours. Thyroid function studies No results for input(s): "TSH", "T4TOTAL", "T3FREE", "THYROIDAB" in the last 72 hours.  Invalid input(s): "FREET3" Anemia work up Recent Labs    06/25/22 0931  VITAMINB12 575  FOLATE 24.4  FERRITIN 106  TIBC 328  IRON 87  RETICCTPCT 2.1   Microbiology Recent Results (from the past 240 hour(s))  MRSA Next Gen by PCR, Nasal     Status: None   Collection Time: 06/25/22  9:05 AM   Specimen: Nasal Mucosa; Nasal Swab  Result Value Ref Range Status   MRSA by PCR Next Gen NOT DETECTED NOT DETECTED Final    Comment: (NOTE) The GeneXpert MRSA Assay (FDA approved for NASAL specimens only), is one component of a comprehensive MRSA colonization surveillance program. It is not intended to diagnose MRSA infection nor to guide or monitor treatment for MRSA infections. Test performance is not FDA approved in patients less than 32 years old. Performed at [facility], [place] [address], [place], [place] [address]      Discharge Instructions:   Discharge Instructions     (HEART FAILURE PATIENTS) Call MD:  Anytime you have any of the following symptoms: 1) 3 pound weight gain in 24 hours or 5 pounds in 1 week 2) shortness of breath, with or without a dry hacking cough 3) swelling in the hands, feet or stomach 4) if you have to sleep on extra pillows at night in order to breathe.   Complete by: As directed    Avoid straining    Complete by: As directed    Diet - low sodium heart healthy   Complete by: As directed    Fluid restriction 1500 MLS per day.   Discharge instructions   Complete by: As directed    Follow-up with your primary care provider in 1 week.  Follow-up with cardiology as outpatient in 2 to 3 weeks.  Seek medical attention for worsening symptoms.  Follow-up with podiatry Dr. Amalia Hailey in 1 month.   Heart Failure patients record your daily weight using the same scale at the same time of day   Complete by: As directed    Increase activity slowly   Complete by: As directed    STOP any activity that causes chest pain, shortness of breath, dizziness, sweating, or exessive weakness   Complete by: As directed       Allergies as of 06/27/2022       Reactions   Other    Tetracyclines & Related Other (See Comments)   Syncope and put her "in a coma"   Tramadol Other (See Comments)   Seizures   Phenazopyridine Other (See Comments)   Unknown   Tetracycline Other (See Comments)   Syncope and "Put me in a coma"   Ciprofloxacin Rash, Itching   Codeine Itching, Rash   Estradiol Rash   Patches broke out the skin        Medication List     TAKE these medications    Aimovig 70 MG/ML Soaj Generic drug: Erenumab-aooe administer 50m UNDER THE SKIN every 30 DAYS What changed:  how much to take how to  take this when to take this additional instructions   albuterol 108 (90 Base) MCG/ACT inhaler Commonly known as: ProAir HFA Inhale 2 puffs = 130mg into the lungs every 6 (six) hours as needed for wheezing or shortness of breath. What changed:  how much to take how to take this when to take this reasons to take this additional instructions   busPIRone 15 MG tablet Commonly known as: BUSPAR Take 15 mg by mouth 3 (three) times daily.   CALCIUM 1200 PO Take 1,200 mg by mouth daily.   DULoxetine 20 MG capsule Commonly known as: CYMBALTA Take 1 capsule (20 mg total) by mouth daily.    escitalopram 10 MG tablet Commonly known as: LEXAPRO Take 10 mg by mouth daily.   estradiol 1 MG tablet Commonly known as: ESTRACE Take 1 tablet (1 mg total) by mouth at bedtime.   famotidine 40 MG tablet Commonly known as: PEPCID TAKE ONE TABLET BY MOUTH EVERYDAY AT BEDTIME What changed: See the new instructions.   furosemide 40 MG tablet Commonly known as: LASIX Take 1 tablet (40 mg total) by mouth daily.   haloperidol 5 MG tablet Commonly known as: HALDOL Take 1 tablet (5 mg total) by mouth at bedtime. For psychosis   Iron (Ferrous Sulfate) 325 (65 Fe) MG Tabs Take 325 mg by mouth in the morning and at bedtime.   lamoTRIgine 100 MG tablet Commonly known as: LAMICTAL Take 100 mg by mouth daily.   losartan 50 MG tablet Commonly known as: COZAAR Take 1 tablet (50 mg total) by mouth daily.   multivitamin with minerals tablet Take 1 tablet by mouth daily.   naloxone 4 MG/0.1ML Liqd nasal spray kit Commonly known as: NARCAN Place 0.4 mg into the nose once.   oxyCODONE-acetaminophen 10-325 MG tablet Commonly known as: PERCOCET Take 1 tablet by mouth 4 (four) times daily as needed for pain.   pantoprazole 40 MG tablet Commonly known as: PROTONIX Take 1 tablet (40 mg total) by mouth 2 (two) times daily.   pregabalin 100 MG capsule Commonly known as: LYRICA Take 1 capsule (100 mg total) by mouth 2 (two) times daily. What changed: when to take this   promethazine 12.5 MG tablet Commonly known as: PHENERGAN Take 1 tablet (12.5 mg total) by mouth every 6 (six) hours as needed for nausea or vomiting.   Relistor 150 MG Tabs Generic drug: Methylnaltrexone Bromide Take 450 mg by mouth every morning.   solifenacin 10 MG tablet Commonly known as: VESICARE TAKE ONE TABLET BY MOUTH DAILY   spironolactone 25 MG tablet Commonly known as: ALDACTONE Take 1 tablet (25 mg total) by mouth daily.   topiramate 50 MG tablet Commonly known as: TOPAMAX TAKE ONE TABLET BY  MOUTH TWICE DAILY   traZODone 100 MG tablet Commonly known as: DESYREL Take 200 mg by mouth at bedtime as needed for sleep.        Follow-up Information     PSusy Frizzle MD Follow up.   Specialty: Family Medicine Contact information: [phone]        [facility]   Specialty: [facility] Why: Agency will call you to set up apt times Contact information: [phone]         EEdrick Kins DPM. Go in 1 month(s).   Specialty: Podiatry Contact information: [address]  [place]         [name], MD Follow up in 3 week(s).   Specialties: Cardiology, Radiology Why: Follow-up of congestive heart failure. Contact information: [address] [place] [address] [phone]                  Time coordinating discharge: 39 minutes  Signed:  Sarahelizabeth Conway  [facility] 06/27/2022, 2:12 PM

## 2022-06-27 NOTE — Progress Notes (Signed)
Notified Dr. Gustavo Lah that patient is complaining of abdominal pain after eating and stating that she hasn't had a BM since Tuesday.  Pharmacy is discontinuing Methylnatraxone Bromide because patient is no longer taking at home and this med is not stocked in pharmacy.  Order received for soap suds enema and Miralax.

## 2022-06-27 NOTE — TOC Transition Note (Signed)
Transition of Care [facility]) - CM/SW Discharge Note   Patient Details  Name: [name] [name] MRN: [id number] Date of Birth: 02-08-1966  Transition of Care [facility]) CM/SW Contact:  Carles Collet, RN Phone Number: 06/27/2022, 2:13 PM   Clinical Narrative:     Notified [place] agency of DC today, no DME needs, no other TOC needs identified for DC  Final next level of care: Home w Home Health Services Barriers to Discharge: No Barriers Identified   Patient Goals and CMS Choice CMS Medicare.gov Compare Post Acute Care list provided to:: Patient Choice offered to / list presented to : Patient  Discharge Placement                         Discharge Plan and Services Additional resources added to the After Visit Summary for   In-house Referral: NA Discharge Planning Services: CM Consult Post Acute Care Choice: Home Health, Resumption of Svcs/PTA Provider          DME Arranged: N/A DME Agency: NA       HH Arranged: RN, PT, OT HH Agency: [place] Date [facility] Agency Contacted: 06/27/22 Time [place]: [id number] Representative spoke with at [place]: [place]) Interventions SDOH Screenings   Food Insecurity: No Food Insecurity (06/24/2022)  Housing: Low Risk  (06/24/2022)  Transportation Needs: No Transportation Needs (06/24/2022)  Utilities: Not At Risk (06/24/2022)  Alcohol Screen: Low Risk  (08/21/2020)  Depression (PHQ2-9): Low Risk  (06/21/2022)  Financial Resource Strain: Low Risk  (06/18/2022)  Physical Activity: Inactive (08/21/2020)  Social Connections: Socially Isolated (08/21/2020)  Stress: No Stress Concern Present (08/21/2020)  Tobacco Use: Low Risk  (06/24/2022)     Readmission Risk Interventions    06/25/2022    1:44 PM  Readmission Risk Prevention Plan  Transportation Screening Complete  PCP or Specialist Appt within 3-5 Days Complete  HRI or [place] Complete  Palliative Care Screening Not Applicable  Medication  Review (RN Care Manager) Complete

## 2022-06-28 ENCOUNTER — Telehealth: Payer: Self-pay | Admitting: Family Medicine

## 2022-06-28 NOTE — Addendum Note (Signed)
Addended by: Star Age on: 06/28/2022 04:40 PM   Modules accepted: Orders

## 2022-06-28 NOTE — Telephone Encounter (Signed)
Patient is requesting a prescription for diflucan to be called into [facility]. She believes she has a UTI.  CB# [id number]

## 2022-06-28 NOTE — Procedures (Signed)
[facility]  HOME SLEEP TEST (Watch PAT) REPORT  STUDY DATE: 06/22/2022  DOB: Aug 25, 1965  MRN: [id number]  ORDERING CLINICIAN: Star Age, MD, PhD   REFERRING CLINICIAN: [name], MD   CLINICAL INFORMATION/HISTORY: 57 year old female with an underlying complex medical history of hypertension, asthma, reflux disease, prediabetes, arthritis, anxiety, depression, and severe obesity with a BMI of over 17, who presents for reevaluation of her obstructive sleep apnea.  She has not been on PAP therapy in about 4 years.    Epworth sleepiness score: 18/24.  BMI: 41.4 kg/m  FINDINGS:   Sleep Summary:   Total Recording Time (hours, min): 9 hours, 54 min  Total Sleep Time (hours, min):  9 hours, 20 min  Percent REM (%):    26.4%   Respiratory Indices:   Calculated pAHI (per hour):  40.4/hour         REM pAHI:    46.9/hour       NREM pAHI: 38.2/hour  Central pAHI: 8.5/hour  Oxygen Saturation Statistics:    Oxygen Saturation (%) Mean: 93%   Minimum oxygen saturation (%):                 75%   O2 Saturation Range (%): 75-98%    O2 Saturation (minutes) <=88%: 9.1 min  Pulse Rate Statistics:   Pulse Mean (bpm):    72/min    Pulse Range (52 - 105/min)   IMPRESSION: OSA (obstructive sleep apnea), severe Central Sleep Apnea   RECOMMENDATION:  This home sleep test demonstrates severe obstructive sleep apnea with a total AHI of 40.4/hour and O2 nadir of 75%.  There was a milder central sleep apnea component.  Mild to moderate snoring was detected.  Treatment with positive airway pressure is highly recommended. This will require - ideally - a full night CPAP titration study for proper treatment settings, O2 monitoring and mask fitting. For now, the patient will be advised to proceed with an autoPAP titration/trial at home. A laboratory attended titration study can be considered in the future for optimization of treatment settings and to improve  tolerance and compliance. Alternative treatment options are limited secondary to the severity of the patient's sleep disordered breathing, but may include surgical treatment with an implantable hypoglossal nerve stimulator (in carefully selected candidates, meeting criteria).  Concomitant weight loss is recommended, where clinically appropriate. Please note, that untreated obstructive sleep apnea may carry additional perioperative morbidity. Patients with significant obstructive sleep apnea should receive perioperative PAP therapy and the surgeons and particularly the anesthesiologist should be informed of the diagnosis and the severity of the sleep disordered breathing. The patient should be cautioned not to drive, work at heights, or operate dangerous or heavy equipment when tired or sleepy. Review and reiteration of good sleep hygiene measures should be pursued with any patient. Other causes of the patient's symptoms, including circadian rhythm disturbances, an underlying mood disorder, medication effect and/or an underlying medical problem cannot be ruled out based on this test. Clinical correlation is recommended.  The patient and her referring provider will be notified of the test results. The patient will be seen in follow up in sleep clinic at [facility].  I certify that I have reviewed the raw data recording prior to the issuance of this report in accordance with the standards of the American Academy of Sleep Medicine (AASM).    INTERPRETING PHYSICIAN:   Star Age, MD, PhD Medical Director, [place] [facility] at [facility]) [place], ABPN (Neurology and Sleep)  [facility] [address] [place], [place] [address] [phone]

## 2022-06-29 ENCOUNTER — Other Ambulatory Visit: Payer: Self-pay | Admitting: Family Medicine

## 2022-06-29 MED ORDER — FLUCONAZOLE 150 MG PO TABS: 150.0000 mg | ORAL_TABLET | Freq: Once | ORAL | 0 refills | Status: AC

## 2022-06-29 NOTE — Progress Notes (Deleted)
Cardiology Office Note:    Date:  06/29/2022   ID:  [name] [name], DOB 1965/06/17, MRN [id number]  PCP:  [name], MD  Cardiologist:  Quay Burow, MD  Electrophysiologist:  None   Referring MD: [name], MD   Chief Complaint: hospital follow-up of volume overload   History of Present Illness:    [name] [name] is a 57 y.o. female with a history of atypical chest pain with multiple negative Myoviews, palpitations with no significant arrhythmias noted on prior monitor in 2018, hypertension, pre-diabetes, obstructive sleep apnea on CPAP, GERD, anxiety/ depression, Bipolar disorder, and substance abuse who is followed by Dr. [name] and presents today for hospital follow-up of volume overload.  Patient has had a history of intermittent chest pain in the past. Remote cardiac catheterization in 2002 showed normal coronaries. She had low risk Myoviews in 2016, 2017, and 2018 with no evidence of ischemia. She also has had a history of palpitations and chest pain in the past has been associated with tachypalpitations. Monitor in 2018 showed normal sinus rhythm with no significant arrhythmias. She was admitted in 08/2019 for syncope preceded by palpitations. Echo showed LVEF of 55-60% with normal wall motion and diastolic parameters and mild MR. Telemetry was unrevealing but she was started on low dose Metoprolol with improvement in symptoms for a while. Repeat monitor was ordered at office visit in 01/2021 but looks like patient never completed this.   Patient was last seen by Dr. [name] in 02/2022 at which time she was doing well and denied any chest pain or palpitations. She did report some occasional shortness of breath which was felt to be multifactorial. Coronary calcium score was ordered but has not been done.   Since last office visit, she had a prolonged hospitalization from 05/21/2022 to 06/15/2022 for septic shock secondary to right foot cellulitis. 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 Hospitalization was complicated by hyponatremia, AKI, lethargy felt to be due to polypharmacy, urinary retention requiring Foley, UTI, and nausea/vomiting.   She was then admitted from 06/24/2022 to 06/27/2022 after presenting with worsening lower extremity edema as well as shortness of breath and a 30 lb weight gain. Her PCP had prescribed Lasix but she did not have any improvement with this so presented to the ED. BNP was mildly elevated at 247. Chest CTA was negative for PE but showed small to moderate bilateral pleural effusion with adjacent compressive atelectasis. Recent abdominal/ pelvic CT in 05/2022 showed new perihepatic ascites with diffuse mesenteric and body wall edema as well as bilateral pleural effusion consistent with anasarca. Echo showed LVEF of 55-60% with no regional wall motion abnormalities and normal diastolic parameters. She was diuresed with IV Lasix. Patient was seen by Cardiology and edema was not felt to be due to CHF. She was complaining of stomach pain and abdominal/ pelvic CT during prior admission showed new perihepatic ascites, diffuse mesenteric and body wall edema, and bilateral pleural effusion consistent with anasarca. There was question of some portal hypertension. Cardiology recommended patient be discharged on Lasix and Spironolactone.  Patient presents today for follow-up. ***  Volume Overload Patient was recently admitted for worsening lower extremity edema, shortness of breath, and a 30 lbs weight gain. BNP mildly elevated at 247. Echo showed LVEF of 55-60% with no regional wall motion abnormalities and normal diastolic parameters. She was diuresed with IV Lasix. She was seen by Cardiology and volume overload was not  felt to be due to CHF. Recent abdominal/ pelvic showed findings consistent with ascites and Dr. Debara Pickett had question of possible portal hypertension. He recommended discharge on Lasix and  Spironolactone.  - *** - Continue Lasix 40mg  daily. - Continue Spironolactone 25mg  daily.   History of Atypical Chest Pain Patient has a history of atypical chest pain and has had multiple negative Myoviews in the past most recently in 2018. Coronary calcium score was ordered at last office visit in 02/2022 but never completed. - No chest pain. - Coronary calcium score ***  Palpitations Patient has a history of tachypalpitations. Prior monitor in 2018 showed norma sinus rhythm with no significant arrhythmias.  - ***  Hypertension BP well controlled. *** - Continue current medications: Losartan 50mg  daily and Spironolactone 25mg  daily.    Past Medical History:  Diagnosis Date   Anxiety    Arthritis    Phreesia 02/08/2020   Asthma    mild intermittent   Asthma    Phreesia 02/08/2020   Bipolar 1 disorder ([place])    ect treatments last treatment Sep 02 1011   Depression    Depression    Phreesia 02/08/2020   Depression    Phreesia 07/10/2020   GERD (gastroesophageal reflux disease)    Headache    Hypertension    Pre-diabetes    Seizures ([place])    Thinks it was from taking Tramadol   Sleep apnea    wears CPAP, uncertain of setting   Substance abuse ([place])    Phreesia 02/08/2020    Past Surgical History:  Procedure Laterality Date   ABDOMINAL HYSTERECTOMY     AMPUTATION TOE Left 02/02/2018   Procedure: AMPUTATION TOE Left 4th toe;  Surgeon: [name], DPM;  Location: [place];  Service: Podiatry;  Laterality: Left;   APPENDECTOMY N/A    Phreesia 02/08/2020   BACK SURGERY  2018   ACDF   C5-6 & C6-7 by Dr. Rennis Harding   CARPAL TUNNEL RELEASE     x2   I & D EXTREMITY Right 05/22/2022   Procedure: IRRIGATION AND DEBRIDEMENT EXTREMITY;  Surgeon: Felipa Furnace, DPM;  Location: [facility];  Service: Podiatry;  Laterality: Right;   INCISION AND DRAINAGE Right 05/24/2022   Procedure: RIGHT REVISION INCISION AND DRAINAGE/ WASHOUT WITH PRIMARY DELAYED CLOSURE;  Surgeon:  [name], DPM;  Location: [facility];  Service: Podiatry;  Laterality: Right;   IRRIGATION AND DEBRIDEMENT ABSCESS Right 10/25/2020   Procedure: IRRIGATION AND DEBRIDEMENT ABSCESS OF FOOT AND APPLICATION OF GRAFT;  Surgeon: [name], DPM;  Location: [place];  Service: Podiatry;  Laterality: Right;   Laproscopic knee surgery Right    NECK SURGERY  03/15/2017   ACDF   by Dr. Patrice Paradise   PLANTAR FASCIA RELEASE     x2   REVERSE SHOULDER ARTHROPLASTY Right 04/14/2022   Procedure: REVERSE SHOULDER ARTHROPLASTY;  Surgeon: Hiram Gash, MD;  Location: [facility];  Service: Orthopedics;  Laterality: Right;   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   Procedure: TRANSMETATARSAL AMPUTATION;  Surgeon: [name], DPM;  Location: [facility];  Service: Podiatry;  Laterality: Right;   TRANSMETATARSAL AMPUTATION Right 05/27/2022   Procedure: REVISION TRANSMETATARSAL AMPUTATION WITH DELAYED PRIMARY CLOSURE;  Surgeon: [name], DPM;  Location: [facility];  Service: Podiatry;  Laterality: Right;    Current Medications: No outpatient medications have been marked as taking for the 07/07/22 encounter (Appointment) with Darreld Mclean, PA-C.     Allergies:   Other,  Tetracyclines & related, Tramadol, Phenazopyridine, Tetracycline, Ciprofloxacin, Codeine, and Estradiol   Social History   Socioeconomic History   Marital status: Widowed    Spouse name: Not on file   Number of children: 3   Years of education: 49   Highest education level: Not on file  Occupational History   Occupation: Disabled  Tobacco Use   Smoking status: Never   Smokeless tobacco: Never  Vaping Use   Vaping Use: Never used  Substance and Sexual Activity   Alcohol use: No   Drug use: No   Sexual activity: Not Currently  Other Topics Concern   Not on file  Social History Narrative   Patient drink 4 16oz caffeine drinks a day    Social Determinants of Health   Financial Resource Strain: Low Risk  (06/18/2022)    Overall Financial Resource Strain (CARDIA)    Difficulty of Paying Living Expenses: Not very hard  Food Insecurity: No Food Insecurity (06/24/2022)   Hunger Vital Sign    Worried About Running Out of Food in the Last Year: Never true    Ran Out of Food in the Last Year: Never true  Transportation Needs: No Transportation Needs (06/24/2022)   PRAPARE - Hydrologist (Medical): No    Lack of Transportation (Non-Medical): No  Physical Activity: Inactive (08/21/2020)   Exercise Vital Sign    Days of Exercise per Week: 0 days    Minutes of Exercise per Session: 0 min  Stress: No Stress Concern Present (08/21/2020)   [place]    Feeling of Stress : Not at all  Social Connections: Socially Isolated (08/21/2020)   Social Connection and Isolation Panel [NHANES]    Frequency of Communication with Friends and Family: More than three times a week    Frequency of Social Gatherings with Friends and Family: Twice a week    Attends Religious Services: Never    Marine scientist or Organizations: No    Attends Archivist Meetings: Never    Marital Status: Widowed     Family History: The patient's family history includes COPD in her mother; Cancer in her father; Diabetes in her mother; Heart disease in her brother, daughter, father, maternal grandfather, maternal grandmother, and paternal grandfather; Hyperlipidemia in her father and mother; Hypertension in her father, maternal grandmother, and mother; Kidney cancer in her paternal grandmother; Sleep apnea in her brother, father, maternal grandmother, and mother.  ROS:   Please see the history of present illness.     EKGs/Labs/Other Studies Reviewed:    The following studies were reviewed:  Myoview 10/13/2016: Nuclear stress EF: 67%. The left ventricular ejection fraction is hyperdynamic (>65%). There was no ST segment deviation noted during  stress. The study is normal.   1. EF 67%, normal wall motion.  2. Fixed small, mild mid anterior perfusion defect.  Given normal wall motion, suspect soft tissue attenuation.     Low risk study.  _______________  Echocardiogram 06/25/2022: Impressions: 1. Left ventricular ejection fraction, by estimation, is 55 to 60%. The  left ventricle has normal function. The left ventricle has no regional  wall motion abnormalities. Left ventricular diastolic parameters were  normal.   2. Right ventricular systolic function is normal. The right ventricular  size is normal.   3. Left atrial size was mildly dilated.   4. The mitral valve is normal in structure. Trivial mitral valve  regurgitation. No evidence of  mitral stenosis.   5. The aortic valve is normal in structure. Aortic valve regurgitation is  not visualized. No aortic stenosis is present.   6. The inferior vena cava is normal in size with greater than 50%  respiratory variability, suggesting right atrial pressure of 3 mmHg.    EKG:  EKG not ordered today.   Recent Labs: 06/07/2022: ALT 10; TSH 2.259 06/24/2022: B Natriuretic Peptide 247.5 06/27/2022: BUN 17; Creatinine, Ser 1.13; Hemoglobin 8.4; Magnesium 2.1; Platelets 268; Potassium 3.9; Sodium 138  Recent Lipid Panel    Component Value Date/Time   CHOL 187 09/18/2021 1051   TRIG 271 (H) 09/18/2021 1051   HDL 67 09/18/2021 1051   CHOLHDL 2.8 09/18/2021 1051   VLDL 45 (H) 11/18/2016 1522   LDLCALC 84 09/18/2021 1051    Physical Exam:    Vital Signs: There were no vitals taken for this visit.    Wt Readings from Last 3 Encounters:  06/27/22 236 lb 5.3 oz (107.2 kg)  06/21/22 263 lb 3.2 oz (119.4 kg)  06/18/22 267 lb (121.1 kg)     General: 57 y.o. female in no acute distress. HEENT: Normocephalic and atraumatic. Sclera clear. EOMs intact. Neck: Supple. No carotid bruits. No JVD. Heart: *** RRR. Distinct S1 and S2. No murmurs, gallops, or rubs. Radial and distal pedal  pulses 2+ and equal bilaterally. Lungs: No increased work of breathing. Clear to ausculation bilaterally. No wheezes, rhonchi, or rales.  Abdomen: Soft, non-distended, and non-tender to palpation. Bowel sounds present in all 4 quadrants.  MSK: Normal strength and tone for age. *** Extremities: No lower extremity edema.    Skin: Warm and dry. Neuro: Alert and oriented x3. No focal deficits. Psych: Normal affect. Responds appropriately.   Assessment:    No diagnosis found.  Plan:     Disposition: Follow up in ***   Medication Adjustments/Labs and Tests Ordered: Current medicines are reviewed at length with the patient today.  Concerns regarding medicines are outlined above.  No orders of the defined types were placed in this encounter.  No orders of the defined types were placed in this encounter.   There are no Patient Instructions on file for this visit.   Signed, Darreld Mclean, PA-C  06/29/2022 8:50 PM    [place]

## 2022-06-30 ENCOUNTER — Telehealth: Payer: Self-pay | Admitting: *Deleted

## 2022-06-30 NOTE — Telephone Encounter (Addendum)
Spoke with patient and discussed her sleep study results. Patient is ok with urgent setup of autopap. I have sent the order to [facility]. Patient is ok with using [facility] again as she has used them prior. Patient understands the insurance compliance requirements which includes using the machine at least 4 hours per night and also being seen in our office between 30 and 90 days after setup for an initial follow-up appointment. The patient did not have any questions at the moment. She will await a call from [employer].   Report routed to PCP.   Addendum: [facility] confirmed receipt of the order on 07/01/22

## 2022-06-30 NOTE — Telephone Encounter (Signed)
-----   Message from Star Age, MD sent at 06/28/2022  4:40 PM EDT ----- Urgent set up requested on PAP therapy, due to severe OSA. Patient referred by Dr. Dennard Schaumann, seen by me on 03/23/2022, patient had a HST on 06/22/2022.    Please call and notify the patient that the recent home sleep test showed obstructive sleep apnea in the severe range. I recommend treatment for this in the form of autoPAP, which means, that we don't have to bring her in for a sleep study with CPAP, but will let her start using a so called autoPAP machine at home, through a DME company (of her choice, or as per insurance requirement). The DME representative will fit the patient with a mask of choice, educate her on how to use the machine, how to put the mask on, etc. I have placed an order in the chart. Please send the order to a local DME, talk to patient, send report to referring MD. Please also reinforce the need for compliance with treatment. We will need a FU in sleep clinic for 10 weeks post-PAP set up, please arrange that with me or one of our NPs. Thanks,   Star Age, MD, PhD [facility])

## 2022-07-01 ENCOUNTER — Encounter: Payer: Self-pay | Admitting: Family Medicine

## 2022-07-02 ENCOUNTER — Ambulatory Visit (INDEPENDENT_AMBULATORY_CARE_PROVIDER_SITE_OTHER): Payer: 59 | Admitting: Family Medicine

## 2022-07-02 VITALS — BP 122/68 | HR 84 | Temp 98.4°F | Ht 64.0 in | Wt 238.8 lb

## 2022-07-02 DIAGNOSIS — I5031 Acute diastolic (congestive) heart failure: Secondary | ICD-10-CM | POA: Diagnosis not present

## 2022-07-02 DIAGNOSIS — D649 Anemia, unspecified: Secondary | ICD-10-CM

## 2022-07-02 DIAGNOSIS — G4710 Hypersomnia, unspecified: Secondary | ICD-10-CM

## 2022-07-02 DIAGNOSIS — R5383 Other fatigue: Secondary | ICD-10-CM | POA: Diagnosis not present

## 2022-07-02 NOTE — Progress Notes (Signed)
Subjective:    Patient ID: [name] [name], female    DOB: July 30, 1965, 57 y.o.   MRN: [id number] Patient was recently admitted with fluid overload secondary to congestive heart failure with preserved ejection fraction.  I had seen the patient after most recent hospitalization and she gained 30 pounds.  She has peripheral edema as well as crackles in the lungs.  Her BNP was elevated.  Also there has been a precipitous drop in her hemoglobin.  We have tried outpatient therapy with Lasix and also gave her iron however she eventually went to the hospital with shortness of breath.  I have reviewed the hospital discharge summary below: Admit date: 06/24/2022 Discharge date: 06/27/2022   Admitted From: Home   Discharge disposition: Home with home health   Recommendations for Outpatient Follow-Up:    Follow up with your primary care provider in one week.  Check CBC, BMP, magnesium in the next visit Follow-up with cardiology in  3 weeks for heart failure follow-up.. Follow-up with Dr. Amalia Hailey podiatry in 1 month. Would benefit from getting her CPAP at home as soon as possible.   Discharge Diagnosis:    Principal Problem:   Bilateral leg edema Active Problems:   Essential hypertension   Shortness of breath   Sleep apnea   Morbid obesity ([name])   Anemia   Discharge Condition: Improved.   Diet recommendation: .  Low-sodium diet, fluid restriction 1500 mL/day   Wound care: None.   Code status: Full.   History of Present Illness:    Patient is a 57 years old female with past medical history of asthma, bipolar disorder, hypertension, prediabetes, sleep apnea presents to the hospital with shortness of breath, leg swelling and worsening fatigue with orthopnea and ambulatory dysfunction due to increasing edema.  Had 30 pound weight gain.  Was on Lasix as outpatient with her primary care physician but was not getting better so she decided to come to the hospital.  In the ED, patient had bilateral  gross pitting edema and BNP was elevated.  Chest x-ray showed some evidence of congestion.  Patient was then admitted hospital for congestive heart failure, failed outpatient treatment.      Hospital Course:    Following conditions were addressed during hospitalization as listed below,   Acute diastolic heart failure.  Presented with worsening dyspnea, paroxysmal nocturnal dyspnea and crackles on exam with peripheral pitting edema.   Failed outpatient treatment with diuretics.  Elevated BNP.  CTA PE protocol was ordered from the ED which showed no evidence of pulmonary embolism but small to moderate bilateral pleural effusion with pulmonary edema.   2D echocardiogram shows LV ejection fraction of 55 to 60%. Received IV Lasix 40 mg IV twice daily initially during hospitalization with significant diuresis.  Patient was seen by cardiology as well.  At this time patient has been started on losartan and spironolactone.  Patient will continue Lasix on discharge as well.  Will need to follow-up with primary care physician and cardiology as outpatient for closer monitoring of renal function.      Dyspnea, shortness of breath with leg swelling and left leg pain.. On albuterol at home.  Marland Kitchen  Ultrasound of the lower extremity was negative for DVT.  Edema and dyspnea and symptoms have significantly improved.  Transition plan for oral diuretic from 06/27/2022.     Hypokalemia.  Improved.  Latest potassium of 3.9.   Ambulatory dysfunction due to volume overload.  Received diuretics and will continue oral Lasix  on discharge.  Patient was seen by physical therapy during hospitalization recommend home health on discharge.   Recent AKI.  Patient was on meloxicam and spironolactone which was discontinued.  Creatinine on presentation 0.9.  Creatinine has slightly bumped up to 1.2 today.  Will hold off with IV diuretics.  Check BMP in AM.   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  Had a recent prolonged hospitalization secondary to  this problem.  Was seen by podiatry during hospitalization.  Recommend follow-up with Dr. Amalia Hailey podiatry in 1 month.   History of sleep apnea Patient recently had sleep study done but is yet to receive her CPAP machine.  Continue auto titrating CPAP while in the hospital.   Hypertension as per history. Cardiology saw the patient during hospitalization.  On losartan and spironolactone.  Blood pressure has overall improved.   History of bipolar disorder.  Continue Cymbalta, buspirone, Haldol, Lamictal and Lyrica, trazodone.      History of migraine.  Patient take Aimovig monthly as outpatient including Topamax.  Has recently received Aimovig.  Takes Imitrex at home as needed.     Anemia.  Patient denies any obvious blood loss.  Hemoglobin today at 8.0.  Iron level at 87 ferritin [id number] folic acid 24 vitamin [id number] at 575.  Reticulocyte count 2.1%.   Disposition.  At this time, patient is stable for disposition home with outpatient PCP and cardiology/podiatry follow-up.  Patient will be discharged with home health on discharge.   Medical Consultants:    Cardiology Podiatry Patient is here today for follow-up.  She states that she was short of breath at rest.  She is sitting comfortably.  There is no peripheral edema in her legs and her lungs are clear to auscultation bilaterally.  Echocardiogram showed preserved ejection fraction.  CT angiogram ruled out pulmonary embolism.  Lab studies showed low iron with a normal B12 however her stool has not been checked for blood..  Patient continues to report hypersomnolence, fatigue, and shortness of breath with activity  Past Medical History:  Diagnosis Date   Anxiety    Arthritis    Phreesia 02/08/2020   Asthma    mild intermittent   Asthma    Phreesia 02/08/2020   Bipolar 1 disorder ([place])    ect treatments last treatment Sep 02 1011   Depression    Depression    [employer]reesia 02/08/2020    Depression    Phreesia 07/10/2020   GERD (gastroesophageal reflux disease)    Headache    Hypertension    Pre-diabetes    Seizures ([place])    Thinks it was from taking Tramadol   Sleep apnea    AHI 40 (2024)   Substance abuse ([place])    Phreesia 02/08/2020   Past Surgical History:  Procedure Laterality Date   ABDOMINAL HYSTERECTOMY     AMPUTATION TOE Left 02/02/2018   Procedure: AMPUTATION TOE Left 4th toe;  Surgeon: [name], DPM;  Location: [facility];  Service: Podiatry;  Laterality: Left;   APPENDECTOMY N/A    Phreesia 02/08/2020   BACK SURGERY  2018   ACDF   C5-6 & C6-7 by Dr. Rennis Harding   CARPAL TUNNEL RELEASE     x2   I & D EXTREMITY Right 05/22/2022   Procedure: IRRIGATION AND DEBRIDEMENT EXTREMITY;  Surgeon: Felipa Furnace, DPM;  Location: [facility];  Service: Podiatry;  Laterality: Right;   INCISION AND DRAINAGE Right 05/24/2022   Procedure: RIGHT REVISION  INCISION AND DRAINAGE/ WASHOUT WITH PRIMARY DELAYED CLOSURE;  Surgeon: [name], DPM;  Location: [facility];  Service: Podiatry;  Laterality: Right;   IRRIGATION AND DEBRIDEMENT ABSCESS Right 10/25/2020   Procedure: IRRIGATION AND DEBRIDEMENT ABSCESS OF FOOT AND APPLICATION OF GRAFT;  Surgeon: [name], DPM;  Location: [facility];  Service: Podiatry;  Laterality: Right;   Laproscopic knee surgery Right    NECK SURGERY  03/15/2017   ACDF   by Dr. Patrice Paradise   PLANTAR FASCIA RELEASE     x2   REVERSE SHOULDER ARTHROPLASTY Right 04/14/2022   Procedure: REVERSE SHOULDER ARTHROPLASTY;  Surgeon: Hiram Gash, MD;  Location: [facility];  Service: Orthopedics;  Laterality: Right;   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   Procedure: TRANSMETATARSAL AMPUTATION;  Surgeon: [name], DPM;  Location: [facility];  Service: Podiatry;  Laterality: Right;   TRANSMETATARSAL AMPUTATION Right 05/27/2022   Procedure: REVISION TRANSMETATARSAL AMPUTATION WITH DELAYED PRIMARY CLOSURE;  Surgeon: [name], DPM;  Location: [facility];  Service: Podiatry;  Laterality: Right;   Current Outpatient Medications on File Prior to Visit  Medication Sig Dispense Refill   albuterol (PROAIR HFA) 108 (90 Base) MCG/ACT inhaler Inhale 2 puffs = 156mcg into the lungs every 6 (six) hours as needed for wheezing or shortness of breath. (Patient taking differently: Inhale 2 puffs into the lungs every 6 (six) hours as needed for wheezing or shortness of breath.) 8.5 g 3   busPIRone (BUSPAR) 15 MG tablet Take 15 mg by mouth 3 (three) times daily.   2   Calcium Carbonate-Vit D-Min (CALCIUM 1200 PO) Take 1,200 mg by mouth daily.      DULoxetine (CYMBALTA) 20 MG capsule Take 1 capsule (20 mg total) by mouth daily. 30 capsule 0   Erenumab-aooe (AIMOVIG) 70 MG/ML SOAJ administer 74mL UNDER THE SKIN every 30 DAYS (Patient taking differently: Inject 70 mg into the skin every 30 (thirty) days.) 1 mL 5   escitalopram (LEXAPRO) 10 MG tablet Take 10 mg by mouth daily.     estradiol (ESTRACE) 1 MG tablet Take 1 tablet (1 mg total) by mouth at bedtime. 90 tablet 3   famotidine (PEPCID) 40 MG tablet TAKE ONE TABLET BY MOUTH EVERYDAY AT BEDTIME (Patient taking differently: Take 40 mg by mouth at bedtime.) 90 tablet 0   fluconazole (DIFLUCAN) 150 MG tablet Take 150 mg by mouth once.     furosemide (LASIX) 40 MG tablet Take 1 tablet (40 mg total) by mouth daily. 30 tablet 3   haloperidol (HALDOL) 5 MG tablet Take 1 tablet (5 mg total) by mouth at bedtime. For psychosis     Iron, Ferrous Sulfate, 325 (65 Fe) MG TABS Take 325 mg by mouth in the morning and at bedtime. 60 tablet 1   lamoTRIgine (LAMICTAL) 100 MG tablet Take 100 mg by mouth daily.     levocetirizine (XYZAL) 5 MG tablet SMARTSIG:1 Tablet(s) By Mouth Every Evening     losartan (COZAAR) 50 MG tablet Take 1 tablet (50 mg total) by mouth daily. 30 tablet 2   Multiple Vitamins-Minerals (MULTIVITAMIN WITH MINERALS) tablet Take 1 tablet by mouth daily.     naloxone (NARCAN) nasal spray 4 mg/0.1 mL Place  0.4 mg into the nose once.     oxyCODONE-acetaminophen (PERCOCET) 10-325 MG tablet Take 1 tablet by mouth 4 (four) times daily as needed for pain.     pantoprazole (PROTONIX) 40 MG tablet Take 1 tablet (40 mg total) by mouth 2 (  two) times daily. 180 tablet 3   pregabalin (LYRICA) 100 MG capsule Take 1 capsule (100 mg total) by mouth 2 (two) times daily. (Patient taking differently: Take 100 mg by mouth 3 (three) times daily.) 90 capsule 3   promethazine (PHENERGAN) 12.5 MG tablet Take 1 tablet (12.5 mg total) by mouth every 6 (six) hours as needed for nausea or vomiting. 30 tablet 0   RELISTOR 150 MG TABS Take 450 mg by mouth every morning.     solifenacin (VESICARE) 10 MG tablet TAKE ONE TABLET BY MOUTH DAILY 90 tablet 0   spironolactone (ALDACTONE) 25 MG tablet Take 1 tablet (25 mg total) by mouth daily. 30 tablet 2   topiramate (TOPAMAX) 50 MG tablet TAKE ONE TABLET BY MOUTH TWICE DAILY 180 tablet 0   traZODone (DESYREL) 100 MG tablet Take 200 mg by mouth at bedtime as needed for sleep.  0   No current facility-administered medications on file prior to visit.   Allergies  Allergen Reactions   Other    Tetracyclines & Related Other (See Comments)    Syncope and put her "in a coma"   Tramadol Other (See Comments)    Seizures   Phenazopyridine Other (See Comments)    Unknown   Tetracycline Other (See Comments)    Syncope and "Put me in a coma"   Ciprofloxacin Rash and Itching   Codeine Itching and Rash   Estradiol Rash    Patches broke out the skin   Social History   Socioeconomic History   Marital status: Widowed    Spouse name: Not on file   Number of children: 3   Years of education: 27   Highest education level: Not on file  Occupational History   Occupation: Disabled  Tobacco Use   Smoking status: Never   Smokeless tobacco: Never  Vaping Use   Vaping Use: Never used  Substance and Sexual Activity   Alcohol use: No   Drug use: No   Sexual activity: Not Currently   Other Topics Concern   Not on file  Social History Narrative   Patient drink 4 16oz caffeine drinks a day    Social Determinants of Health   Financial Resource Strain: Low Risk  (06/18/2022)   Overall Financial Resource Strain (CARDIA)    Difficulty of Paying Living Expenses: Not very hard  Food Insecurity: No Food Insecurity (06/24/2022)   Hunger Vital Sign    Worried About Running Out of Food in the Last Year: Never true    Ran Out of Food in the Last Year: Never true  Transportation Needs: No Transportation Needs (06/24/2022)   PRAPARE - Hydrologist (Medical): No    Lack of Transportation (Non-Medical): No  Physical Activity: Inactive (08/21/2020)   Exercise Vital Sign    Days of Exercise per Week: 0 days    Minutes of Exercise per Session: 0 min  Stress: No Stress Concern Present (08/21/2020)   [place]    Feeling of Stress : Not at all  Social Connections: Socially Isolated (08/21/2020)   Social Connection and Isolation Panel [NHANES]    Frequency of Communication with Friends and Family: More than three times a week    Frequency of Social Gatherings with Friends and Family: Twice a week    Attends Religious Services: Never    Marine scientist or Organizations: No    Attends Archivist Meetings: Never  Marital Status: Widowed  Intimate Partner Violence: Not At Risk (06/24/2022)   Humiliation, Afraid, Rape, and Kick questionnaire    Fear of Current or Ex-Partner: No    Emotionally Abused: No    Physically Abused: No    Sexually Abused: No   Family History  Problem Relation Age of Onset   Diabetes Mother    COPD Mother    Hypertension Mother    Hyperlipidemia Mother    Sleep apnea Mother    Heart disease Father    Hyperlipidemia Father    Hypertension Father    Cancer Father    Sleep apnea Father    Heart disease Brother    Sleep apnea Brother    Heart  disease Maternal Grandmother    Hypertension Maternal Grandmother    Sleep apnea Maternal Grandmother    Heart disease Maternal Grandfather    Kidney cancer Paternal Grandmother    Heart disease Paternal Grandfather    Heart disease Daughter       Review of Systems  Respiratory:  Positive for shortness of breath.   All other systems reviewed and are negative.      Objective:   Physical Exam Vitals reviewed.  Constitutional:      General: She is not in acute distress.    Appearance: She is well-developed. She is not diaphoretic.  HENT:     Head: Normocephalic and atraumatic.     Right Ear: External ear normal.     Left Ear: External ear normal.     Nose: Nose normal.     Mouth/Throat:     Pharynx: No oropharyngeal exudate.  Eyes:     General: No scleral icterus.       Right eye: No discharge.        Left eye: No discharge.     Conjunctiva/sclera: Conjunctivae normal.     Pupils: Pupils are equal, round, and reactive to light.  Neck:     Thyroid: No thyromegaly.     Trachea: No tracheal deviation.  Cardiovascular:     Rate and Rhythm: Normal rate and regular rhythm.     Heart sounds: Normal heart sounds. No murmur heard.    No friction rub. No gallop.  Pulmonary:     Effort: Pulmonary effort is normal. No respiratory distress.     Breath sounds: No stridor. Examination of the right-lower field reveals rales. Examination of the left-lower field reveals rales. Rales present. No decreased breath sounds, wheezing or rhonchi.  Chest:     Chest wall: No tenderness.  Abdominal:     General: Bowel sounds are normal. There is no distension.     Palpations: Abdomen is soft. There is no mass.     Tenderness: There is no abdominal tenderness. There is no guarding or rebound.  Musculoskeletal:        General: No tenderness. Normal range of motion.     Cervical back: Normal range of motion and neck supple.     Right lower leg: No edema.     Left lower leg: No edema.   Lymphadenopathy:     Cervical: No cervical adenopathy.  Skin:    General: Skin is warm.     Coloration: Skin is not pale.     Findings: No erythema or rash.  Neurological:     Mental Status: She is alert and oriented to person, place, and time.     Cranial Nerves: No cranial nerve deficit.     Motor: No abnormal muscle  tone.     Coordination: Coordination normal.     Deep Tendon Reflexes: Reflexes are normal and symmetric.  Psychiatric:        Behavior: Behavior normal.        Thought Content: Thought content normal.        Judgment: Judgment normal.           Assessment & Plan:  Acute diastolic congestive heart failure ([name]) - Plan: CBC with Differential/Platelet, Fecal Globin By Immunochemistry, Magnesium, COMPLETE METABOLIC PANEL WITH GFR  Anemia, unspecified type  Hypersomnolence  Fatigue, unspecified type From a medical standpoint, I believe her fatigue is likely due to anemia coupled with her recent congestive heart failure exacerbation.  Patient appears euvolemic today.  I will check a CMP and a magnesium level to monitor her electrolytes.  Continue spironolactone with losartan and Lasix at the current dose.  If her renal function and potassium can handle it, would consider uptitrating spironolactone versus adding Jardiance given her history of congestive heart failure with preserved EF.  She has an appointment later in March to see cardiology.  Would likely defer to cardiology's opinion about adding Jardiance.  With regards to her anemia, she is current on iron.  She denies any obvious blood loss.  At the present time it appears that she has iron deficiency anemia with an inadequate bone marrow response.  I will repeat a CBC today.  I will also check her stool for blood because if she has blood in her stool I would recommend a GI consultation.  With regard to her hypersomnolence and fatigue, I feel a lot of this is due to polypharmacy.  She is on oxycodone and Lyrica for  assistance in the pain clinic.  She is also on Haldol.  I question if these medications could be causing a lot of her fatigue.  I recommend that she discuss this with her pain doctor to see if she can wean off the oxycodone.

## 2022-07-03 ENCOUNTER — Other Ambulatory Visit: Payer: Self-pay | Admitting: Family Medicine

## 2022-07-03 LAB — COMPLETE METABOLIC PANEL WITH GFR
AG Ratio: 1.2 (calc) (ref 1.0–2.5)
ALT: 15 U/L (ref 6–29)
AST: 15 U/L (ref 10–35)
Albumin: 3.7 g/dL (ref 3.6–5.1)
Alkaline phosphatase (APISO): 103 U/L (ref 37–153)
BUN/Creatinine Ratio: 22 (calc) (ref 6–22)
BUN: 26 mg/dL — ABNORMAL HIGH (ref 7–25)
CO2: 29 mmol/L (ref 20–32)
Calcium: 9.8 mg/dL (ref 8.6–10.4)
Chloride: 105 mmol/L (ref 98–110)
Creat: 1.19 mg/dL — ABNORMAL HIGH (ref 0.50–1.03)
Globulin: 3.2 g/dL (calc) (ref 1.9–3.7)
Glucose, Bld: 131 mg/dL — ABNORMAL HIGH (ref 65–99)
Potassium: 5.5 mmol/L — ABNORMAL HIGH (ref 3.5–5.3)
Sodium: 141 mmol/L (ref 135–146)
Total Bilirubin: 0.3 mg/dL (ref 0.2–1.2)
Total Protein: 6.9 g/dL (ref 6.1–8.1)
eGFR: 53 mL/min/{1.73_m2} — ABNORMAL LOW (ref >=60)

## 2022-07-03 LAB — CBC WITH DIFFERENTIAL/PLATELET
Absolute Monocytes: 384 cells/uL (ref 200–950)
Basophils Absolute: 38 cells/uL (ref 0–200)
Basophils Relative: 0.6 %
Eosinophils Absolute: 179 cells/uL (ref 15–500)
Eosinophils Relative: 2.8 %
HCT: 27.2 % — ABNORMAL LOW (ref 35.0–45.0)
Hemoglobin: 8.6 g/dL — ABNORMAL LOW (ref 11.7–15.5)
Lymphs Abs: 2336 cells/uL (ref 850–3900)
MCH: 30.7 pg (ref 27.0–33.0)
MCHC: 31.6 g/dL — ABNORMAL LOW (ref 32.0–36.0)
MCV: 97.1 fL (ref 80.0–100.0)
MPV: 10.2 fL (ref 7.5–12.5)
Monocytes Relative: 6 %
Neutro Abs: 3462 cells/uL (ref 1500–7800)
Neutrophils Relative %: 54.1 %
Platelets: 251 10*3/uL (ref 140–400)
RBC: 2.8 10*6/uL — ABNORMAL LOW (ref 3.80–5.10)
RDW: 13.8 % (ref 11.0–15.0)
Total Lymphocyte: 36.5 %
WBC: 6.4 10*3/uL (ref 3.8–10.8)

## 2022-07-03 LAB — MAGNESIUM: Magnesium: 2.3 mg/dL (ref 1.5–2.5)

## 2022-07-05 ENCOUNTER — Telehealth: Payer: Self-pay

## 2022-07-05 ENCOUNTER — Other Ambulatory Visit: Payer: Self-pay

## 2022-07-05 DIAGNOSIS — D649 Anemia, unspecified: Secondary | ICD-10-CM

## 2022-07-05 DIAGNOSIS — K625 Hemorrhage of anus and rectum: Secondary | ICD-10-CM

## 2022-07-05 NOTE — Telephone Encounter (Signed)
Pt called and states she had bright red bleeding over the weekend with her BM. Pt sent in her Fecal Globin test today. No further bleeding today.  Does pt need to come in to be seen by you? Thank you.

## 2022-07-05 NOTE — Telephone Encounter (Signed)
Pt called in stating that she would like to talk to nurse about having blood in her stool please. Please advise.  Cb#: [phone]

## 2022-07-06 ENCOUNTER — Emergency Department (HOSPITAL_COMMUNITY): Payer: 59

## 2022-07-06 ENCOUNTER — Inpatient Hospital Stay (HOSPITAL_COMMUNITY)
Admission: EM | Admit: 2022-07-06 | Discharge: 2022-07-12 | DRG: 092 | Disposition: A | Discharge status: Home Health | Payer: 59 | Attending: Internal Medicine | Admitting: Internal Medicine

## 2022-07-06 ENCOUNTER — Other Ambulatory Visit: Payer: Self-pay

## 2022-07-06 ENCOUNTER — Encounter (HOSPITAL_COMMUNITY): Payer: Self-pay

## 2022-07-06 DIAGNOSIS — Z89422 Acquired absence of other left toe(s): Secondary | ICD-10-CM

## 2022-07-06 DIAGNOSIS — Z886 Allergy status to analgesic agent status: Secondary | ICD-10-CM

## 2022-07-06 DIAGNOSIS — R339 Retention of urine, unspecified: Secondary | ICD-10-CM | POA: Diagnosis present

## 2022-07-06 DIAGNOSIS — Z8249 Family history of ischemic heart disease and other diseases of the circulatory system: Secondary | ICD-10-CM

## 2022-07-06 DIAGNOSIS — G4730 Sleep apnea, unspecified: Secondary | ICD-10-CM | POA: Diagnosis present

## 2022-07-06 DIAGNOSIS — J4520 Mild intermittent asthma, uncomplicated: Secondary | ICD-10-CM | POA: Diagnosis not present

## 2022-07-06 DIAGNOSIS — I5032 Chronic diastolic (congestive) heart failure: Secondary | ICD-10-CM | POA: Diagnosis not present

## 2022-07-06 DIAGNOSIS — N179 Acute kidney failure, unspecified: Secondary | ICD-10-CM | POA: Diagnosis present

## 2022-07-06 DIAGNOSIS — E871 Hypo-osmolality and hyponatremia: Secondary | ICD-10-CM | POA: Diagnosis present

## 2022-07-06 DIAGNOSIS — T50915A Adverse effect of multiple unspecified drugs, medicaments and biological substances, initial encounter: Secondary | ICD-10-CM | POA: Diagnosis present

## 2022-07-06 DIAGNOSIS — Z79899 Other long term (current) drug therapy: Secondary | ICD-10-CM

## 2022-07-06 DIAGNOSIS — Z881 Allergy status to other antibiotic agents status: Secondary | ICD-10-CM

## 2022-07-06 DIAGNOSIS — I130 Hypertensive heart and chronic kidney disease with heart failure and stage 1 through stage 4 chronic kidney disease, or unspecified chronic kidney disease: Secondary | ICD-10-CM | POA: Diagnosis present

## 2022-07-06 DIAGNOSIS — Z96611 Presence of right artificial shoulder joint: Secondary | ICD-10-CM | POA: Diagnosis present

## 2022-07-06 DIAGNOSIS — Z885 Allergy status to narcotic agent status: Secondary | ICD-10-CM

## 2022-07-06 DIAGNOSIS — G894 Chronic pain syndrome: Secondary | ICD-10-CM | POA: Diagnosis present

## 2022-07-06 DIAGNOSIS — R400 Somnolence: Secondary | ICD-10-CM | POA: Diagnosis not present

## 2022-07-06 DIAGNOSIS — G928 Other toxic encephalopathy: Principal | ICD-10-CM | POA: Diagnosis present

## 2022-07-06 DIAGNOSIS — G4733 Obstructive sleep apnea (adult) (pediatric): Secondary | ICD-10-CM | POA: Diagnosis present

## 2022-07-06 DIAGNOSIS — F319 Bipolar disorder, unspecified: Secondary | ICD-10-CM | POA: Diagnosis present

## 2022-07-06 DIAGNOSIS — G629 Polyneuropathy, unspecified: Secondary | ICD-10-CM | POA: Diagnosis present

## 2022-07-06 DIAGNOSIS — N182 Chronic kidney disease, stage 2 (mild): Secondary | ICD-10-CM | POA: Diagnosis present

## 2022-07-06 DIAGNOSIS — Z79890 Hormone replacement therapy (postmenopausal): Secondary | ICD-10-CM

## 2022-07-06 DIAGNOSIS — Z6838 Body mass index (BMI) 38.0-38.9, adult: Secondary | ICD-10-CM

## 2022-07-06 DIAGNOSIS — Z825 Family history of asthma and other chronic lower respiratory diseases: Secondary | ICD-10-CM

## 2022-07-06 DIAGNOSIS — G9340 Encephalopathy, unspecified: Secondary | ICD-10-CM | POA: Diagnosis not present

## 2022-07-06 DIAGNOSIS — Z9049 Acquired absence of other specified parts of digestive tract: Secondary | ICD-10-CM

## 2022-07-06 DIAGNOSIS — D631 Anemia in chronic kidney disease: Secondary | ICD-10-CM | POA: Diagnosis present

## 2022-07-06 DIAGNOSIS — F3160 Bipolar disorder, current episode mixed, unspecified: Secondary | ICD-10-CM | POA: Diagnosis present

## 2022-07-06 DIAGNOSIS — Z833 Family history of diabetes mellitus: Secondary | ICD-10-CM

## 2022-07-06 DIAGNOSIS — K5900 Constipation, unspecified: Secondary | ICD-10-CM | POA: Diagnosis present

## 2022-07-06 DIAGNOSIS — D509 Iron deficiency anemia, unspecified: Secondary | ICD-10-CM | POA: Diagnosis present

## 2022-07-06 DIAGNOSIS — J45909 Unspecified asthma, uncomplicated: Secondary | ICD-10-CM | POA: Diagnosis present

## 2022-07-06 DIAGNOSIS — E669 Obesity, unspecified: Secondary | ICD-10-CM | POA: Diagnosis present

## 2022-07-06 DIAGNOSIS — R0601 Orthopnea: Secondary | ICD-10-CM | POA: Diagnosis present

## 2022-07-06 DIAGNOSIS — Z90710 Acquired absence of both cervix and uterus: Secondary | ICD-10-CM

## 2022-07-06 DIAGNOSIS — R7303 Prediabetes: Secondary | ICD-10-CM | POA: Diagnosis present

## 2022-07-06 DIAGNOSIS — I10 Essential (primary) hypertension: Secondary | ICD-10-CM | POA: Diagnosis present

## 2022-07-06 DIAGNOSIS — E860 Dehydration: Secondary | ICD-10-CM | POA: Diagnosis present

## 2022-07-06 DIAGNOSIS — F419 Anxiety disorder, unspecified: Secondary | ICD-10-CM | POA: Diagnosis present

## 2022-07-06 DIAGNOSIS — Z89431 Acquired absence of right foot: Secondary | ICD-10-CM

## 2022-07-06 DIAGNOSIS — Z888 Allergy status to other drugs, medicaments and biological substances status: Secondary | ICD-10-CM

## 2022-07-06 DIAGNOSIS — M1990 Unspecified osteoarthritis, unspecified site: Secondary | ICD-10-CM | POA: Diagnosis present

## 2022-07-06 DIAGNOSIS — K219 Gastro-esophageal reflux disease without esophagitis: Secondary | ICD-10-CM | POA: Diagnosis present

## 2022-07-06 LAB — FERRITIN: Ferritin: 133 ng/mL (ref 11–307)

## 2022-07-06 LAB — CBC WITH DIFFERENTIAL/PLATELET
Abs Immature Granulocytes: 0.02 10*3/uL (ref 0.00–0.07)
Basophils Absolute: 0 10*3/uL (ref 0.0–0.1)
Basophils Relative: 0 %
Eosinophils Absolute: 0.1 10*3/uL (ref 0.0–0.5)
Eosinophils Relative: 1 %
HCT: 31.1 % — ABNORMAL LOW (ref 36.0–46.0)
Hemoglobin: 9.8 g/dL — ABNORMAL LOW (ref 12.0–15.0)
Immature Granulocytes: 0 %
Lymphocytes Relative: 19 %
Lymphs Abs: 1 10*3/uL (ref 0.7–4.0)
MCH: 31.4 pg (ref 26.0–34.0)
MCHC: 31.5 g/dL (ref 30.0–36.0)
MCV: 99.7 fL (ref 80.0–100.0)
Monocytes Absolute: 0.4 10*3/uL (ref 0.1–1.0)
Monocytes Relative: 7 %
Neutro Abs: 3.6 10*3/uL (ref 1.7–7.7)
Neutrophils Relative %: 73 %
Platelets: 199 10*3/uL (ref 150–400)
RBC: 3.12 MIL/uL — ABNORMAL LOW (ref 3.87–5.11)
RDW: 13.7 % (ref 11.5–15.5)
WBC: 5.1 10*3/uL (ref 4.0–10.5)
nRBC: 0 % (ref 0.0–0.2)

## 2022-07-06 LAB — AMMONIA: Ammonia: 11 umol/L (ref 9–35)

## 2022-07-06 LAB — RAPID URINE DRUG SCREEN, HOSP PERFORMED
Amphetamines: NOT DETECTED
Barbiturates: NOT DETECTED
Benzodiazepines: NOT DETECTED
Cocaine: NOT DETECTED
Opiates: NOT DETECTED
Tetrahydrocannabinol: NOT DETECTED

## 2022-07-06 LAB — COMPREHENSIVE METABOLIC PANEL
ALT: 12 U/L (ref 0–44)
AST: 16 U/L (ref 15–41)
Albumin: 3.5 g/dL (ref 3.5–5.0)
Alkaline Phosphatase: 98 U/L (ref 38–126)
Anion gap: 10 (ref 5–15)
BUN: 19 mg/dL (ref 6–20)
CO2: 22 mmol/L (ref 22–32)
Calcium: 8.9 mg/dL (ref 8.9–10.3)
Chloride: 101 mmol/L (ref 98–111)
Creatinine, Ser: 1.05 mg/dL — ABNORMAL HIGH (ref 0.44–1.00)
GFR, Estimated: >60 mL/min (ref >60)
Glucose, Bld: 95 mg/dL (ref 70–99)
Potassium: 4.1 mmol/L (ref 3.5–5.1)
Sodium: 133 mmol/L — ABNORMAL LOW (ref 135–145)
Total Bilirubin: 0.2 mg/dL — ABNORMAL LOW (ref 0.3–1.2)
Total Protein: 7.2 g/dL (ref 6.5–8.1)

## 2022-07-06 LAB — BLOOD GAS, VENOUS
Acid-Base Excess: 2.8 mmol/L — ABNORMAL HIGH (ref 0.0–2.0)
Bicarbonate: 27.2 mmol/L (ref 20.0–28.0)
O2 Saturation: 52.1 %
Patient temperature: 36.7
pCO2, Ven: 39 mmHg — ABNORMAL LOW (ref 44–60)
pH, Ven: 7.44 — ABNORMAL HIGH (ref 7.25–7.43)
pO2, Ven: <31 mmHg — CL (ref 32–45)

## 2022-07-06 LAB — URINALYSIS, ROUTINE W REFLEX MICROSCOPIC
Bilirubin Urine: NEGATIVE
Glucose, UA: NEGATIVE mg/dL
Ketones, ur: NEGATIVE mg/dL
Leukocytes,Ua: NEGATIVE
Nitrite: NEGATIVE
Protein, ur: 100 mg/dL — AB
RBC / HPF: >50 RBC/hpf (ref 0–5)
Specific Gravity, Urine: 1.008 (ref 1.005–1.030)
pH: 8 (ref 5.0–8.0)

## 2022-07-06 LAB — RETICULOCYTES
Immature Retic Fract: 12.1 % (ref 2.3–15.9)
RBC.: 3.08 MIL/uL — ABNORMAL LOW (ref 3.87–5.11)
Retic Count, Absolute: 50.5 10*3/uL (ref 19.0–186.0)
Retic Ct Pct: 1.6 % (ref 0.4–3.1)

## 2022-07-06 LAB — IRON AND TIBC
Iron: 20 ug/dL — ABNORMAL LOW (ref 28–170)
Saturation Ratios: 6 % — ABNORMAL LOW (ref 10.4–31.8)
TIBC: 333 ug/dL (ref 250–450)
UIBC: 313 ug/dL

## 2022-07-06 LAB — TSH: TSH: 5.489 u[IU]/mL — ABNORMAL HIGH (ref 0.350–4.500)

## 2022-07-06 LAB — MAGNESIUM: Magnesium: 2.2 mg/dL (ref 1.7–2.4)

## 2022-07-06 LAB — ETHANOL: Alcohol, Ethyl (B): <10 mg/dL (ref <10)

## 2022-07-06 LAB — FOLATE: Folate: 36.1 ng/mL (ref >5.9)

## 2022-07-06 LAB — VITAMIN B12: Vitamin B-12: 791 pg/mL (ref 180–914)

## 2022-07-06 MED ORDER — NALOXONE HCL 0.4 MG/ML IJ SOLN
0.4000 mg | Freq: Once | INTRAMUSCULAR | Status: AC
  Administered 2022-07-06: 0.4 mg via INTRAVENOUS
  Filled 2022-07-06: qty 1

## 2022-07-06 MED ORDER — ACETAMINOPHEN 325 MG PO TABS
650.0000 mg | ORAL_TABLET | Freq: Four times a day (QID) | ORAL | Status: DC | PRN
  Administered 2022-07-07 – 2022-07-08 (×4): 650 mg via ORAL
  Filled 2022-07-06 (×4): qty 2

## 2022-07-06 MED ORDER — LOSARTAN POTASSIUM 50 MG PO TABS
50.0000 mg | ORAL_TABLET | Freq: Every day | ORAL | Status: DC
  Administered 2022-07-07 – 2022-07-10 (×4): 50 mg via ORAL
  Filled 2022-07-06 (×4): qty 1

## 2022-07-06 MED ORDER — ONDANSETRON HCL 4 MG PO TABS: 4.0000 mg | ORAL_TABLET | Freq: Four times a day (QID) | ORAL | Status: DC | PRN

## 2022-07-06 MED ORDER — ONDANSETRON HCL 4 MG/2ML IJ SOLN: 4.0000 mg | Freq: Four times a day (QID) | INTRAMUSCULAR | Status: DC | PRN

## 2022-07-06 MED ORDER — ALBUTEROL SULFATE (2.5 MG/3ML) 0.083% IN NEBU: 3.0000 mL | INHALATION_SOLUTION | Freq: Four times a day (QID) | RESPIRATORY_TRACT | Status: DC | PRN

## 2022-07-06 MED ORDER — ENOXAPARIN SODIUM 60 MG/0.6ML IJ SOSY: 50.0000 mg | PREFILLED_SYRINGE | INTRAMUSCULAR | Status: DC

## 2022-07-06 MED ORDER — ENOXAPARIN SODIUM 60 MG/0.6ML IJ SOSY
50.0000 mg | PREFILLED_SYRINGE | INTRAMUSCULAR | Status: DC
  Administered 2022-07-06 – 2022-07-11 (×6): 50 mg via SUBCUTANEOUS
  Filled 2022-07-06 (×6): qty 0.6

## 2022-07-06 MED ORDER — ACETAMINOPHEN 650 MG RE SUPP: 650.0000 mg | Freq: Four times a day (QID) | RECTAL | Status: DC | PRN

## 2022-07-06 MED ORDER — PANTOPRAZOLE SODIUM 40 MG PO TBEC
40.0000 mg | DELAYED_RELEASE_TABLET | Freq: Two times a day (BID) | ORAL | Status: DC
  Administered 2022-07-07 – 2022-07-12 (×11): 40 mg via ORAL
  Filled 2022-07-06 (×11): qty 1

## 2022-07-06 MED ORDER — SENNOSIDES-DOCUSATE SODIUM 8.6-50 MG PO TABS: 1.0000 | ORAL_TABLET | Freq: Every evening | ORAL | Status: DC | PRN

## 2022-07-06 NOTE — ED Triage Notes (Signed)
Pt to the ed from home where she lives with family. Pt has increases lethargy over the last few days. Pt relays pain in her shoulders and right hip. Family relays pt has been sleeping an increased amount. Pt denies denies any cp, dizziness, loc, fever, chills, nausea/ vomiting.

## 2022-07-06 NOTE — ED Notes (Signed)
Sandi Carne (Mother) called asking for a update. Her number is [phone].

## 2022-07-06 NOTE — ED Provider Notes (Signed)
[facility] Provider Note   CSN: [id number] Arrival date & time: 07/06/22  1455     History  Chief Complaint  Patient presents with   Fatigue    ANIQUA BOHART is a 57 y.o. female.  HPI   This patient is a 57 year old female, chronically ill with multiple medical problems including a history of possible congestive heart failure, history of sleep apnea, anemia and obesity, she has hypertension.  She is also bipolar, she had presented to the hospital in March a couple of weeks ago with a feeling of increasing edema increasing fatigue and orthopnea, she had a 30 pound weight gain that was reported, she spent the better part of 3 days in the hospital during which time she was treated for acute diastolic heart failure.  2D echocardiogram showed the ejection fraction of 55 to 60%, she was given Lasix twice a day and had significant diuresis.  She was started on losartan and spironolactone.  Continue Lasix at discharge.  The patient had an ultrasound that was negative for DVT, hypokalemia which resolved with treatment, she is on Cymbalta buspirone Haldol Lamictal Lyrica and trazodone for her bipolar disorder and her anemia was evaluated finding that her hemoglobin was 8.0  Of note the patient did follow-up with her family doctor and was actually seen on the 15th during which time she was complaining of fatigue which was thought to be related to anemia, she was euvolemic in the office according to the notes, she is on iron for her anemia it was thought that polypharmacy was the likely cause of her hypersomnolence and fatigue.  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  This is healed up very well and there was no issues however the family wanted her to be transported  because of the hypersomnolence.  When asked the patient about this she states I can sleep all day, excessively sleepy all the time.  She states this is not new but is been going on for weeks.  Home Medications Prior to Admission medications   Medication Sig Start Date End Date Taking? Authorizing Provider  albuterol (PROAIR HFA) 108 (90 Base) MCG/ACT inhaler Inhale 2 puffs = 171mcg into the lungs every 6 (six) hours as needed for wheezing or shortness of breath. Patient taking differently: Inhale 2 puffs into the lungs every 6 (six) hours as needed for wheezing or shortness of breath. 07/11/20   Eulogio Bear, NP  busPIRone (BUSPAR) 15 MG tablet Take 15 mg by mouth 3 (three) times daily.  04/26/17   [provider]  Calcium Carbonate-Vit D-Min (CALCIUM 1200 PO) Take 1,200 mg by mouth daily.     [provider]  DULoxetine (CYMBALTA) 20 MG capsule Take 1 capsule (20 mg total) by mouth daily. 06/15/22 07/15/22  Sidney Ace, MD  Erenumab-aooe (AIMOVIG) 70 MG/ML SOAJ administer 35mL UNDER THE SKIN every 30 DAYS Patient taking differently: Inject 70 mg into the skin every 30 (thirty) days. 02/08/22   [name], MD  escitalopram (LEXAPRO) 10 MG tablet Take 10 mg by mouth daily. 08/23/19   [provider]  estradiol (ESTRACE) 1 MG tablet Take 1 tablet (1 mg total) by mouth at bedtime. 07/27/21   [name], MD  famotidine (PEPCID) 40 MG  tablet TAKE ONE TABLET BY MOUTH EVERYDAY AT BEDTIME Patient taking differently: Take 40 mg by mouth at bedtime. 04/20/22   [name], MD  fluconazole (DIFLUCAN) 150 MG tablet Take 150 mg by mouth once. 06/29/22   [provider]  furosemide (LASIX) 40 MG tablet Take 1 tablet (40 mg total) by mouth daily. 06/18/22   [name], MD  haloperidol (HALDOL) 5 MG tablet Take 1 tablet (5 mg total) by mouth at bedtime. For psychosis 11/15/15   Barton Dubois, MD  Iron, Ferrous Sulfate, 325 (65 Fe) MG TABS Take 325  mg by mouth in the morning and at bedtime. 06/22/22   [name], MD  lamoTRIgine (LAMICTAL) 100 MG tablet Take 100 mg by mouth daily. 08/06/21   [provider]  levocetirizine (XYZAL) 5 MG tablet SMARTSIG:1 Tablet(s) By Mouth Every Evening 06/28/22   [provider]  losartan (COZAAR) 50 MG tablet Take 1 tablet (50 mg total) by mouth daily. 06/27/22 09/25/22  Pokhrel, Corrie Mckusick, MD  Multiple Vitamins-Minerals (MULTIVITAMIN WITH MINERALS) tablet Take 1 tablet by mouth daily.    [provider]  naloxone [facility]) nasal spray 4 mg/0.1 mL Place 0.4 mg into the nose once. 08/03/21   [provider]  oxyCODONE-acetaminophen (PERCOCET) 10-325 MG tablet Take 1 tablet by mouth 4 (four) times daily as needed for pain.    [provider]  pantoprazole (PROTONIX) 40 MG tablet Take 1 tablet (40 mg total) by mouth 2 (two) times daily. 02/02/22   [name], MD  pregabalin (LYRICA) 100 MG capsule Take 1 capsule (100 mg total) by mouth 2 (two) times daily. Patient taking differently: Take 100 mg by mouth 3 (three) times daily. 07/27/21   [name], MD  promethazine (PHENERGAN) 12.5 MG tablet Take 1 tablet (12.5 mg total) by mouth every 6 (six) hours as needed for nausea or vomiting. 06/15/22   Sreenath, [name], MD  RELISTOR 150 MG TABS Take 450 mg by mouth every morning. 02/04/22   [provider]  solifenacin (VESICARE) 10 MG tablet TAKE ONE TABLET BY MOUTH DAILY 04/26/22   [name], MD  spironolactone (ALDACTONE) 25 MG tablet Take 1 tablet (25 mg total) by mouth daily. 06/27/22 09/25/22  Pokhrel, Corrie Mckusick, MD  topiramate (TOPAMAX) 50 MG tablet TAKE ONE TABLET BY MOUTH TWICE DAILY 04/20/22   [name], MD  traZODone (DESYREL) 100 MG tablet Take 200 mg by mouth at bedtime as needed for sleep. 07/29/14   [provider]      Allergies    Other, Tetracyclines & related, Tramadol, Phenazopyridine, Tetracycline, Ciprofloxacin,  Codeine, and Estradiol    Review of Systems   Review of Systems  All other systems reviewed and are negative.   Physical Exam Updated Vital Signs BP 114/67 (BP Location: Right Arm)   Pulse 78   Temp 98.5 F (36.9 C) (Oral)   Ht 1.626 m (5\' 4" )   Wt 108 kg   SpO2 98%   BMI 40.87 kg/m  Physical Exam Vitals and nursing note reviewed.  Constitutional:      General: She is not in acute distress.    Appearance: She is well-developed.  HENT:     Head: Normocephalic and atraumatic.     Mouth/Throat:     Pharynx: No oropharyngeal exudate.  Eyes:     General: No scleral icterus.       Right eye: No discharge.        Left  eye: No discharge.     Conjunctiva/sclera: Conjunctivae normal.     Pupils: Pupils are equal, round, and reactive to light.  Neck:     Thyroid: No thyromegaly.     Vascular: No JVD.  Cardiovascular:     Rate and Rhythm: Normal rate and regular rhythm.     Heart sounds: Normal heart sounds. No murmur heard.    No friction rub. No gallop.  Pulmonary:     Effort: Pulmonary effort is normal. No respiratory distress.     Breath sounds: Normal breath sounds. No wheezing or rales.  Abdominal:     General: Bowel sounds are normal. There is no distension.     Palpations: Abdomen is soft. There is no mass.     Tenderness: There is no abdominal tenderness.  Musculoskeletal:        General: No tenderness. Normal range of motion.     Cervical back: Normal range of motion and neck supple.     Right lower leg: No edema.     Left lower leg: No edema.     Comments: Right foot evaluated, dressing taken down, no signs of open wounds on the partial amputation of the foot.  No redness, no tenderness  Lymphadenopathy:     Cervical: No cervical adenopathy.  Skin:    General: Skin is warm and dry.     Findings: No erythema or rash.  Neurological:     Mental Status: She is alert.     Coordination: Coordination normal.     Comments: The patient is somnolent but arousable to  voice.  She is able to sit up in the bed but with significant slowed movements.  She has no focal weakness, she is able to grip with both hands, she is able to open her eyes and speak and her speech is slow and a little bit slurred but she is able to do it.  She answers questions appropriately  Psychiatric:        Behavior: Behavior normal.     ED Results / Procedures / Treatments   Labs (all labs ordered are listed, but only abnormal results are displayed) Labs Reviewed  CBC WITH DIFFERENTIAL/PLATELET  COMPREHENSIVE METABOLIC PANEL  VITAMIN 123456  FOLATE  IRON AND TIBC  FERRITIN  RETICULOCYTES  TSH  RAPID URINE DRUG SCREEN, HOSP PERFORMED  URINALYSIS, ROUTINE W REFLEX MICROSCOPIC  MAGNESIUM  ETHANOL  AMMONIA    EKG None  Radiology No results found.  Procedures Procedures    Medications Ordered in ED Medications  naloxone (NARCAN) injection 0.4 mg (has no administration in time range)    ED Course/ Medical Decision Making/ A&P                             Medical Decision Making Amount and/or Complexity of Data Reviewed Labs: ordered. Radiology: ordered.  Risk Prescription drug management. Decision regarding hospitalization.   This patient presents to the ED for concern of hypersomnolence and fatigue and lethargy, this involves an extensive number of treatment options, and is a complaint that carries with it a high risk of complications and morbidity.  The differential diagnosis includes electrolyte abnormalities, including hypokalemia hypomagnesemia, renal failure, this could be polypharmacy, underlying infection, unlikely to be related to acute neurologic abnormalities and in fact she underwent a very broad workup for the cause of delirium within the last month or so that was negative for neurologic cause   Co morbidities  that complicate the patient evaluation  Polypharmacy Bipolar   Additional history obtained:  Additional history obtained from  electronic medical record External records from outside source obtained and reviewed including recent hospitalization, echocardiogram, office notes from several days ago   Lab Tests:  I Ordered, and personally interpreted labs.  The pertinent results include: Findings, no significant anemia or electrolyte abnormalities, no infection in the urine   Imaging Studies ordered:  I ordered imaging studies including chest x-ray I independently visualized and interpreted imaging which showed no acute infiltrates I agree with the radiologist interpretation CT scan of the brain without acute findings   Cardiac Monitoring: / EKG:  The patient was maintained on a cardiac monitor.  I personally viewed and interpreted the cardiac monitored which showed an underlying rhythm of: Normal sinus rhythm   Consultations Obtained:  I requested consultation with the was Dr. Myna Hidalgo,  and discussed lab and imaging findings as well as pertinent plan - they recommend: Admission to the hospital for observation   Problem List / ED Course / Critical interventions / Medication management  Patient was observed on monitor, given Narcan, no improvement I ordered medication including naloxone for opiate reversal Reevaluation of the patient after these medicines showed that the patient no significant changes I have reviewed the patients home medicines and have made adjustments as needed   Social Determinants of Health:  Likely has polypharmacy   Test / Admission - Considered:  Appreciate hospitalist for his willingness to admit         Final Clinical Impression(s) / ED Diagnoses Final diagnoses:  Acute encephalopathy  Polypharmacy    Rx / DC Orders ED Discharge Orders     None         Noemi Chapel, MD 07/06/22 2056

## 2022-07-06 NOTE — H&P (Signed)
History and Physical    [name] [name] [id number] DOB: Nov 08, 1965 DOA: 07/06/2022  PCP: [name], MD   Patient coming from: Home   Chief Complaint: Somnolence   HPI: [name] [name] is a 57 y.o. female with medical history significant for asthma, hypertension, bipolar disorder, sleep apnea, CKD stage II, chronic diastolic CHF, and chronic pain who presents to the emergency department with somnolence.  Patient has been noted to be lethargic and somnolent for weeks, was evaluated by her PCP for this a few days ago, and has been worsening. Home health nurse was concerned by the patient's somnolence today and recommended transport to ED. She denies headache, change in vision, or focal numbness or weakness. She denies chest pain, fever, or chills.    ED Course: Upon arrival to the ED, patient is found to be afebrile and saturating well on room air with normal heart rate and stable blood pressure.  EKG demonstrates sinus rhythm, head CT is negative for acute intracranial abnormality, and chest x-ray is negative for acute cardiopulmonary disease.  Blood work is notable for sodium 133, creatinine 1.05, hemoglobin 9.8, and TSH 5.489.  Patient was treated with Narcan in the ED.  Review of Systems:  All other systems reviewed and apart from HPI, are negative.  Past Medical History:  Diagnosis Date   Anxiety    Arthritis    Phreesia 02/08/2020   Asthma    mild intermittent   Asthma    Phreesia 02/08/2020   Bipolar 1 disorder ([place])    ect treatments last treatment Sep 02 1011   Depression    Depression    Phreesia 02/08/2020   Depression    Phreesia 07/10/2020   GERD (gastroesophageal reflux disease)    Headache    Hypertension    Pre-diabetes    Seizures ([place])    Thinks it was from taking Tramadol   Sleep apnea    AHI 40 (2024)   Substance abuse ([place])    Phreesia 02/08/2020    Past Surgical History:  Procedure Laterality Date   ABDOMINAL HYSTERECTOMY      AMPUTATION TOE Left 02/02/2018   Procedure: AMPUTATION TOE Left 4th toe;  Surgeon: [name], DPM;  Location: [facility];  Service: Podiatry;  Laterality: Left;   APPENDECTOMY N/A    Phreesia 02/08/2020   BACK SURGERY  2018   ACDF   C5-6 & C6-7 by Dr. Rennis Harding   CARPAL TUNNEL RELEASE     x2   I & D EXTREMITY Right 05/22/2022   Procedure: IRRIGATION AND DEBRIDEMENT EXTREMITY;  Surgeon: Felipa Furnace, DPM;  Location: [facility];  Service: Podiatry;  Laterality: Right;   INCISION AND DRAINAGE Right 05/24/2022   Procedure: RIGHT REVISION INCISION AND DRAINAGE/ WASHOUT WITH PRIMARY DELAYED CLOSURE;  Surgeon: [name], DPM;  Location: [facility];  Service: Podiatry;  Laterality: Right;   IRRIGATION AND DEBRIDEMENT ABSCESS Right 10/25/2020   Procedure: IRRIGATION AND DEBRIDEMENT ABSCESS OF FOOT AND APPLICATION OF GRAFT;  Surgeon: [name], DPM;  Location: [facility];  Service: Podiatry;  Laterality: Right;   Laproscopic knee surgery Right    NECK SURGERY  03/15/2017   ACDF   by Dr. Patrice Paradise   PLANTAR FASCIA RELEASE     x2   REVERSE SHOULDER ARTHROPLASTY Right 04/14/2022   Procedure: REVERSE SHOULDER ARTHROPLASTY;  Surgeon: Hiram Gash, MD;  Location: [facility];  Service: Orthopedics;  Laterality: Right;   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  Procedure: TRANSMETATARSAL AMPUTATION;  Surgeon: [name], DPM;  Location: [facility];  Service: Podiatry;  Laterality: Right;   TRANSMETATARSAL AMPUTATION Right 05/27/2022   Procedure: REVISION TRANSMETATARSAL AMPUTATION WITH DELAYED PRIMARY CLOSURE;  Surgeon: [name], DPM;  Location: [facility];  Service: Podiatry;  Laterality: Right;    Social History:   reports that she has never smoked. She has never used smokeless tobacco. She reports that she does not drink alcohol and does not use drugs.  Allergies  Allergen Reactions   Other    Tetracyclines & Related Other (See Comments)    Syncope and put her "in a coma"   Tramadol  Other (See Comments)    Seizures   Phenazopyridine Other (See Comments)    Unknown   Tetracycline Other (See Comments)    Syncope and "Put me in a coma"   Ciprofloxacin Rash and Itching   Codeine Itching and Rash   Estradiol Rash    Patches broke out the skin    Family History  Problem Relation Age of Onset   Diabetes Mother    COPD Mother    Hypertension Mother    Hyperlipidemia Mother    Sleep apnea Mother    Heart disease Father    Hyperlipidemia Father    Hypertension Father    Cancer Father    Sleep apnea Father    Heart disease Brother    Sleep apnea Brother    Heart disease Maternal Grandmother    Hypertension Maternal Grandmother    Sleep apnea Maternal Grandmother    Heart disease Maternal Grandfather    Kidney cancer Paternal Grandmother    Heart disease Paternal Grandfather    Heart disease Daughter      Prior to Admission medications   Medication Sig Start Date End Date Taking? Authorizing Provider  albuterol (PROAIR HFA) 108 (90 Base) MCG/ACT inhaler Inhale 2 puffs = 150mcg into the lungs every 6 (six) hours as needed for wheezing or shortness of breath. Patient taking differently: Inhale 2 puffs into the lungs every 6 (six) hours as needed for wheezing or shortness of breath. 07/11/20   Eulogio Bear, NP  busPIRone (BUSPAR) 15 MG tablet Take 15 mg by mouth 3 (three) times daily.  04/26/17   [provider]  Calcium Carbonate-Vit D-Min (CALCIUM 1200 PO) Take 1,200 mg by mouth daily.     [provider]  DULoxetine (CYMBALTA) 20 MG capsule Take 1 capsule (20 mg total) by mouth daily. 06/15/22 07/15/22  Sidney Ace, MD  Erenumab-aooe (AIMOVIG) 70 MG/ML SOAJ administer 29mL UNDER THE SKIN every 30 DAYS Patient taking differently: Inject 70 mg into the skin every 30 (thirty) days. 02/08/22   [name], MD  escitalopram (LEXAPRO) 10 MG tablet Take 10 mg by mouth daily. 08/23/19   [provider]  estradiol (ESTRACE) 1 MG  tablet Take 1 tablet (1 mg total) by mouth at bedtime. 07/27/21   [name], MD  famotidine (PEPCID) 40 MG tablet TAKE ONE TABLET BY MOUTH EVERYDAY AT BEDTIME Patient taking differently: Take 40 mg by mouth at bedtime. 04/20/22   [name], MD  fluconazole (DIFLUCAN) 150 MG tablet Take 150 mg by mouth once. 06/29/22   [provider]  furosemide (LASIX) 40 MG tablet Take 1 tablet (40 mg total) by mouth daily. 06/18/22   [name], MD  haloperidol (HALDOL) 5 MG tablet Take 1 tablet (5 mg total) by mouth at bedtime. For psychosis 11/15/15  Barton Dubois, MD  Iron, Ferrous Sulfate, 325 (65 Fe) MG TABS Take 325 mg by mouth in the morning and at bedtime. 06/22/22   [name], MD  lamoTRIgine (LAMICTAL) 100 MG tablet Take 100 mg by mouth daily. 08/06/21   [provider]  levocetirizine (XYZAL) 5 MG tablet SMARTSIG:1 Tablet(s) By Mouth Every Evening 06/28/22   [provider]  losartan (COZAAR) 50 MG tablet Take 1 tablet (50 mg total) by mouth daily. 06/27/22 09/25/22  Pokhrel, Corrie Mckusick, MD  Multiple Vitamins-Minerals (MULTIVITAMIN WITH MINERALS) tablet Take 1 tablet by mouth daily.    [provider]  naloxone [facility]) nasal spray 4 mg/0.1 mL Place 0.4 mg into the nose once. 08/03/21   [provider]  oxyCODONE-acetaminophen (PERCOCET) 10-325 MG tablet Take 1 tablet by mouth 4 (four) times daily as needed for pain.    [provider]  pantoprazole (PROTONIX) 40 MG tablet Take 1 tablet (40 mg total) by mouth 2 (two) times daily. 02/02/22   [name], MD  pregabalin (LYRICA) 100 MG capsule Take 1 capsule (100 mg total) by mouth 2 (two) times daily. Patient taking differently: Take 100 mg by mouth 3 (three) times daily. 07/27/21   [name], MD  promethazine (PHENERGAN) 12.5 MG tablet Take 1 tablet (12.5 mg total) by mouth every 6 (six) hours as needed for nausea or vomiting. 06/15/22   Sreenath, [name], MD  RELISTOR  150 MG TABS Take 450 mg by mouth every morning. 02/04/22   [provider]  solifenacin (VESICARE) 10 MG tablet TAKE ONE TABLET BY MOUTH DAILY 04/26/22   [name], MD  spironolactone (ALDACTONE) 25 MG tablet Take 1 tablet (25 mg total) by mouth daily. 06/27/22 09/25/22  Pokhrel, Corrie Mckusick, MD  topiramate (TOPAMAX) 50 MG tablet TAKE ONE TABLET BY MOUTH TWICE DAILY 04/20/22   [name], MD  traZODone (DESYREL) 100 MG tablet Take 200 mg by mouth at bedtime as needed for sleep. 07/29/14   [provider]    Physical Exam: Vitals:   07/06/22 1715 07/06/22 1745 07/06/22 1845 07/06/22 1915  BP: 132/66 (!) 140/80 (!) 155/78   Pulse: 71 71 79   Resp:  16 15   Temp:    98 F (36.7 C)  TempSrc:    Oral  SpO2: 100% 100% 100%   Weight:      Height:        Constitutional: NAD, no pallor or cyanosis   Eyes: PERTLA, lids and conjunctivae normal ENMT: Mucous membranes are moist. Posterior pharynx clear of any exudate or lesions.   Neck: supple, no masses  Respiratory: no wheezing, no crackles. No accessory muscle use.  Cardiovascular: S1 & S2 heard, regular rate and rhythm. No JVD.  Abdomen: No distension, no tenderness, soft. Bowel sounds active.  Musculoskeletal: no clubbing / cyanosis. No joint deformity upper and lower extremities.   Skin: no significant rashes, lesions, ulcers. Warm, dry, well-perfused. Neurologic: CN 2-12 grossly intact. Sensation intact, DTR normal. Strength 5/5 in all 4 limbs. Wakes briefly to voice and oriented to person, place, and situation.  Psychiatric: Calm. Cooperative.    Labs and Imaging on Admission: I have personally reviewed following labs and imaging studies  CBC: Recent Labs  Lab 07/02/22 1543 07/06/22 1513  WBC 6.4 5.1  NEUTROABS 3,462 3.6  HGB 8.6* 9.8*  HCT 27.2* 31.1*  MCV 97.1 99.7  PLT 251 [id number]   Basic Metabolic Panel: Recent Labs  Lab 07/02/22 1543  07/06/22 1513  NA 141 133*  K 5.5* 4.1  CL 105 101  CO2 29 22   GLUCOSE 131* 95  BUN 26* 19  CREATININE 1.19* 1.05*  CALCIUM 9.8 8.9  MG 2.3 2.2   GFR: Estimated Creatinine Clearance: 70.1 mL/min (A) (by C-G formula based on SCr of 1.05 mg/dL (H)). Liver Function Tests: Recent Labs  Lab 07/02/22 1543 07/06/22 1513  AST 15 16  ALT 15 12  ALKPHOS  --  98  BILITOT 0.3 0.2*  PROT 6.9 7.2  ALBUMIN  --  3.5   No results for input(s): "LIPASE", "AMYLASE" in the last 168 hours. Recent Labs  Lab 07/06/22 1515  AMMONIA 11   Coagulation Profile: No results for input(s): "INR", "PROTIME" in the last 168 hours. Cardiac Enzymes: No results for input(s): "CKTOTAL", "CKMB", "CKMBINDEX", "TROPONINI" in the last 168 hours. BNP (last 3 results) No results for input(s): "PROBNP" in the last 8760 hours. HbA1C: No results for input(s): "HGBA1C" in the last 72 hours. CBG: No results for input(s): "GLUCAP" in the last 168 hours. Lipid Profile: No results for input(s): "CHOL", "HDL", "LDLCALC", "TRIG", "CHOLHDL", "LDLDIRECT" in the last 72 hours. Thyroid Function Tests: Recent Labs    07/06/22 1513  TSH 5.489*   Anemia Panel: Recent Labs    07/06/22 1513  VITAMINB12 791  FOLATE 36.1  FERRITIN 133  TIBC 333  IRON 20*  RETICCTPCT 1.6   Urine analysis:    Component Value Date/Time   COLORURINE YELLOW 07/06/2022 1940   APPEARANCEUR HAZY (A) 07/06/2022 1940   LABSPEC 1.008 07/06/2022 1940   PHURINE 8.0 07/06/2022 1940   GLUCOSEU NEGATIVE 07/06/2022 1940   HGBUR LARGE (A) 07/06/2022 1940   BILIRUBINUR NEGATIVE 07/06/2022 1940   KETONESUR NEGATIVE 07/06/2022 1940   PROTEINUR 100 (A) 07/06/2022 1940   UROBILINOGEN 0.2 06/23/2017 1108   NITRITE NEGATIVE 07/06/2022 1940   LEUKOCYTESUR NEGATIVE 07/06/2022 1940   Sepsis Labs: @LABRCNTIP (procalcitonin:4,lacticidven:4) )No results found for this or any previous visit (from the past 240 hour(s)).   Radiological Exams on Admission: CT Head Wo Contrast  Result Date: 07/06/2022 CLINICAL DATA:   Motor neuron disease suspected EXAM: CT HEAD WITHOUT CONTRAST TECHNIQUE: Contiguous axial images were obtained from the base of the skull through the vertex without intravenous contrast. RADIATION DOSE REDUCTION: This exam was performed according to the departmental dose-optimization program which includes automated exposure control, adjustment of the mA and/or kV according to patient size and/or use of iterative reconstruction technique. COMPARISON:  CT head June 07, 2022. FINDINGS: Brain: No evidence of acute infarction, hemorrhage, hydrocephalus, extra-axial collection or mass lesion/mass effect. Vascular: No hyperdense vessel. Skull: No acute fracture. Sinuses/Orbits: Largely clear sinuses.  No acute orbital findings. Other: No mastoid effusions. IMPRESSION: No acute abnormality. Electronically Signed   By: Margaretha Sheffield M.D.   On: 07/06/2022 15:47   DG Chest Port 1 View  Result Date: 07/06/2022 CLINICAL DATA:  Cough and weakness. EXAM: PORTABLE CHEST 1 VIEW COMPARISON:  06/24/2022. FINDINGS: Clear lungs. Normal heart size and mediastinal contours. No pleural effusion or pneumothorax. Spinal fusion hardware is again noted. IMPRESSION: No evidence of acute cardiopulmonary disease. Electronically Signed   By: Emmit Alexanders M.D.   On: 07/06/2022 15:33    EKG: Independently reviewed. Sinus rhythm, 1st degree AV block.   Assessment/Plan   1. Acute encephalopathy  - No acute findings on head CT, ammonia is normal, UDS and EtOH negative, TSH 5.489  - Suspect this is related to polypharmacy  -  Check VBG, check free T4, hold sedating medications, use delirium precautions    2. Hypertension  - Continue losartan   3. Bipolar disorder  - Hold sedating medications for now    4. Chronic pain  - Hold sedating medications for now   5. CKD II  - Appears close to baseline  - Renally-dose medications    6. OSA  - CPAP qHS   7. Asthma  - Not in exacerbation on admission   - Continue  albuterol as needed   8. Anemia  - Appears stable, no overt bleeding    9. Chronic diastolic CHF  - Appears compensated  - Hold diuretics initially, monitor weight and I/Os    DVT prophylaxis: Lovenox  Code Status: Full  Level of Care: Level of care: Med-Surg Family Communication: None present   Disposition Plan:  Patient is from: home  Anticipated d/c is to: TBD Anticipated d/c date is: Possibly as early as 3/20 or 07/08/22  Patient currently: Pending improved mental status  Consults called: none  Admission status: Observation     Vianne Bulls, MD [facility]  07/06/2022, 8:51 PM

## 2022-07-06 NOTE — ED Notes (Signed)
ED TO INPATIENT HANDOFF REPORT  ED Nurse Name and Phone #: Oreatha Fabry  S Name/Age/Gender [name] [name] 57 y.o. female Room/Bed: 027C/027C  Code Status   Code Status: Full Code  Home/SNF/Other Home Patient oriented to: self, place, and time Is this baseline? Yes   Triage Complete: Triage complete  Chief Complaint Acute encephalopathy [G93.40]  Triage Note Pt to the ed from home where she lives with family. Pt has increases lethargy over the last few days. Pt relays pain in her shoulders and right hip. Family relays pt has been sleeping an increased amount. Pt denies denies any cp, dizziness, loc, fever, chills, nausea/ vomiting.    Allergies Allergies  Allergen Reactions   Tetracyclines & Related Other (See Comments)    Syncope and put her "in a coma"   Tramadol Other (See Comments)    Seizures   Phenazopyridine Other (See Comments)    Unknown reaction   Tetracycline Other (See Comments)    Syncope and "Put me in a coma"   Ciprofloxacin Rash and Itching   Codeine Itching and Rash   Estradiol Rash    Patches broke out the skin    Level of Care/Admitting Diagnosis ED Disposition     ED Disposition  Admit   Condition  --   [place]: [place] [[id number]]  Level of Care: Med-Surg [16]  May place patient in observation at [facility] or [facility] if equivalent level of care is available:: Yes  Covid Evaluation: Asymptomatic - no recent exposure (last 10 days) testing not required  Diagnosis: Acute encephalopathy [id number]  Admitting Physician: Vianne Bulls [id number]  Attending Physician: Vianne Bulls [id number]          B Medical/Surgery History Past Medical History:  Diagnosis Date   Anxiety    Arthritis    Phreesia 02/08/2020   Asthma    mild intermittent   Asthma    Phreesia 02/08/2020   Bipolar 1 disorder ([place])    ect treatments last treatment Sep 02 1011   Depression    Depression    Phreesia 02/08/2020    Depression    Phreesia 07/10/2020   GERD (gastroesophageal reflux disease)    Headache    Hypertension    Pre-diabetes    Seizures ([place])    Thinks it was from taking Tramadol   Sleep apnea    AHI 40 (2024)   Substance abuse ([place])    Phreesia 02/08/2020   Past Surgical History:  Procedure Laterality Date   ABDOMINAL HYSTERECTOMY     AMPUTATION TOE Left 02/02/2018   Procedure: AMPUTATION TOE Left 4th toe;  Surgeon: [name], DPM;  Location: [facility];  Service: Podiatry;  Laterality: Left;   APPENDECTOMY N/A    Phreesia 02/08/2020   BACK SURGERY  2018   ACDF   C5-6 & C6-7 by Dr. Rennis Harding   CARPAL TUNNEL RELEASE     x2   I & D EXTREMITY Right 05/22/2022   Procedure: IRRIGATION AND DEBRIDEMENT EXTREMITY;  Surgeon: Felipa Furnace, DPM;  Location: [facility];  Service: Podiatry;  Laterality: Right;   INCISION AND DRAINAGE Right 05/24/2022   Procedure: RIGHT REVISION INCISION AND DRAINAGE/ WASHOUT WITH PRIMARY DELAYED CLOSURE;  Surgeon: [name], DPM;  Location: [facility];  Service: Podiatry;  Laterality: Right;   IRRIGATION AND DEBRIDEMENT ABSCESS Right 10/25/2020   Procedure: IRRIGATION AND DEBRIDEMENT ABSCESS OF FOOT AND APPLICATION OF GRAFT;  Surgeon: [name],  DPM;  Location: [facility];  Service: Podiatry;  Laterality: Right;   Laproscopic knee surgery Right    NECK SURGERY  03/15/2017   ACDF   by Dr. Patrice Paradise   PLANTAR FASCIA RELEASE     x2   REVERSE SHOULDER ARTHROPLASTY Right 04/14/2022   Procedure: REVERSE SHOULDER ARTHROPLASTY;  Surgeon: Hiram Gash, MD;  Location: [facility];  Service: Orthopedics;  Laterality: Right;   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   Procedure: TRANSMETATARSAL AMPUTATION;  Surgeon: [name], DPM;  Location: [facility];  Service: Podiatry;  Laterality: Right;   TRANSMETATARSAL AMPUTATION Right 05/27/2022   Procedure: REVISION TRANSMETATARSAL AMPUTATION WITH DELAYED PRIMARY CLOSURE;  Surgeon: [name], DPM;  Location:  [facility];  Service: Podiatry;  Laterality: Right;     A IV Location/Drains/Wounds Patient Lines/Drains/Airways Status     Active Line/Drains/Airways     Name Placement date Placement time Site Days   Peripheral IV 07/06/22 Anterior;Left;Proximal Forearm 07/06/22  1619  Forearm  less than 1   Wound / Incision (Open or Dehisced) Anterior --  --  --  --            Intake/Output Last 24 hours No intake or output data in the 24 hours ending 07/06/22 2129  Labs/Imaging Results for orders placed or performed during the hospital encounter of 07/06/22 (from the past 48 hour(s))  CBC with Differential     Status: Abnormal   Collection Time: 07/06/22  3:13 PM  Result Value Ref Range   WBC 5.1 4.0 - 10.5 K/uL   RBC 3.12 (L) 3.87 - 5.11 MIL/uL   Hemoglobin 9.8 (L) 12.0 - 15.0 g/dL   HCT 31.1 (L) 36.0 - 46.0 %   MCV 99.7 80.0 - 100.0 fL   MCH 31.4 26.0 - 34.0 pg   MCHC 31.5 30.0 - 36.0 g/dL   RDW 13.7 11.5 - 15.5 %   Platelets 199 150 - 400 K/uL   nRBC 0.0 0.0 - 0.2 %   Neutrophils Relative % 73 %   Neutro Abs 3.6 1.7 - 7.7 K/uL   Lymphocytes Relative 19 %   Lymphs Abs 1.0 0.7 - 4.0 K/uL   Monocytes Relative 7 %   Monocytes Absolute 0.4 0.1 - 1.0 K/uL   Eosinophils Relative 1 %   Eosinophils Absolute 0.1 0.0 - 0.5 K/uL   Basophils Relative 0 %   Basophils Absolute 0.0 0.0 - 0.1 K/uL   Immature Granulocytes 0 %   Abs Immature Granulocytes 0.02 0.00 - 0.07 K/uL    Comment: Performed at [facility], [address], [place], [place] [address]  Comprehensive metabolic panel     Status: Abnormal   Collection Time: 07/06/22  3:13 PM  Result Value Ref Range   Sodium 133 (L) 135 - 145 mmol/L   Potassium 4.1 3.5 - 5.1 mmol/L   Chloride 101 98 - 111 mmol/L   CO2 22 22 - 32 mmol/L   Glucose, Bld 95 70 - 99 mg/dL    Comment: Glucose reference range applies only to samples taken after fasting for at least 8 hours.   BUN 19 6 - 20 mg/dL   Creatinine, Ser 1.05 (H) 0.44 - 1.00  mg/dL   Calcium 8.9 8.9 - 10.3 mg/dL   Total Protein 7.2 6.5 - 8.1 g/dL   Albumin 3.5 3.5 - 5.0 g/dL   AST 16 15 - 41 U/L   ALT 12 0 - 44 U/L   Alkaline Phosphatase 98  38 - 126 U/L   Total Bilirubin 0.2 (L) 0.3 - 1.2 mg/dL   GFR, Estimated >60 >60 mL/min    Comment: (NOTE) Calculated using the CKD-EPI Creatinine Equation (2021)    Anion gap 10 5 - 15    Comment: Performed at [facility] [address] [place], [place] [address]  Vitamin B12     Status: None   Collection Time: 07/06/22  3:13 PM  Result Value Ref Range   Vitamin B-12 791 180 - 914 pg/mL    Comment: (NOTE) This assay is not validated for testing neonatal or myeloproliferative syndrome specimens for Vitamin B12 levels. Performed at [facility], [place] [address], [place], [place] [address]   Iron and TIBC     Status: Abnormal   Collection Time: 07/06/22  3:13 PM  Result Value Ref Range   Iron 20 (L) 28 - 170 ug/dL   TIBC 333 250 - 450 ug/dL   Saturation Ratios 6 (L) 10.4 - 31.8 %   UIBC 313 ug/dL    Comment: Performed at [facility], [place] [address] [place], [place] [address]  Ferritin     Status: None   Collection Time: 07/06/22  3:13 PM  Result Value Ref Range   Ferritin 133 11 - 307 ng/mL    Comment: Performed at [facility], [place] [address], [place], [place] [address]  Reticulocytes     Status: Abnormal   Collection Time: 07/06/22  3:13 PM  Result Value Ref Range   Retic Ct Pct 1.6 0.4 - 3.1 %   RBC. 3.08 (L) 3.87 - 5.11 MIL/uL   Retic Count, Absolute 50.5 19.0 - 186.0 K/uL   Immature Retic Fract 12.1 2.3 - 15.9 %    Comment: Performed at [facility] [address], [place], [place] [address]  Magnesium     Status: None   Collection Time: 07/06/22  3:13 PM  Result Value Ref Range   Magnesium 2.2 1.7 - 2.4 mg/dL    Comment: Performed at [facility] [address], [place], [place] [address]  Folate     Status: None   Collection Time: 07/06/22  3:13 PM   Result Value Ref Range   Folate 36.1 >5.9 ng/mL    Comment: Performed at [facility] [address], [place], [place] [address]  TSH     Status: Abnormal   Collection Time: 07/06/22  3:13 PM  Result Value Ref Range   TSH 5.489 (H) 0.350 - 4.500 uIU/mL    Comment: Performed by a 3rd Generation assay with a functional sensitivity of <=0.01 uIU/mL. Performed at [facility], [place] [address], [place], [place] [address]   Ethanol     Status: None   Collection Time: 07/06/22  3:14 PM  Result Value Ref Range   Alcohol, Ethyl (B) <10 <10 mg/dL    Comment: (NOTE) Lowest detectable limit for serum alcohol is 10 mg/dL.  For medical purposes only. Performed at [facility], [place] [address], [place], [place] [address]   Ammonia     Status: None   Collection Time: 07/06/22  3:15 PM  Result Value Ref Range   Ammonia 11 9 - 35 umol/L    Comment: Performed at [facility], [place] [address] [place], [place] [address]  Urinalysis, Routine w reflex microscopic -Urine, Clean Catch     Status: Abnormal   Collection Time: 07/06/22  7:40 PM  Result Value Ref Range   Color, Urine YELLOW YELLOW   APPearance HAZY (A) CLEAR   Specific Gravity, Urine 1.008 1.005 - 1.030   pH 8.0 5.0 - 8.0   Glucose, UA NEGATIVE NEGATIVE mg/dL   Hgb urine dipstick LARGE (A) NEGATIVE   Bilirubin Urine NEGATIVE NEGATIVE   Ketones, ur NEGATIVE NEGATIVE mg/dL   Protein, ur 100 (A) NEGATIVE mg/dL   Nitrite NEGATIVE NEGATIVE   Leukocytes,Ua NEGATIVE NEGATIVE   RBC / HPF >50 0 - 5 RBC/hpf   WBC, UA 0-5 0 - 5 WBC/hpf   Bacteria, UA RARE (A) NONE SEEN   Squamous Epithelial / HPF 0-5 0 - 5 /HPF    Comment: Performed at [facility] [address] [place], [place] [address]  Rapid urine drug screen (hospital performed)     Status: None   Collection Time: 07/06/22  7:41 PM  Result Value Ref Range   Opiates NONE DETECTED NONE DETECTED   Cocaine NONE DETECTED NONE DETECTED   Benzodiazepines  NONE DETECTED NONE DETECTED   Amphetamines NONE DETECTED NONE DETECTED   Tetrahydrocannabinol NONE DETECTED NONE DETECTED   Barbiturates NONE DETECTED NONE DETECTED    Comment: (NOTE) DRUG SCREEN FOR MEDICAL PURPOSES ONLY.  IF CONFIRMATION IS NEEDED FOR ANY PURPOSE, NOTIFY LAB WITHIN 5 DAYS.  LOWEST DETECTABLE LIMITS FOR URINE DRUG SCREEN Drug Class                     Cutoff (ng/mL) Amphetamine and metabolites    1000 Barbiturate and metabolites    200 Benzodiazepine                 200 Opiates and metabolites        300 Cocaine and metabolites        300 THC                            50 Performed at [facility], [place] [address] [place], [place] [address]    *Note: Due to a large number of results and/or encounters for the requested time period, some results have not been displayed. A complete set of results can be found in Results Review.   CT Head Wo Contrast  Result Date: 07/06/2022 CLINICAL DATA:  Motor neuron disease suspected EXAM: CT HEAD WITHOUT CONTRAST TECHNIQUE: Contiguous axial images were obtained from the base of the skull through the vertex without intravenous contrast. RADIATION DOSE REDUCTION: This exam was performed according to the departmental dose-optimization program which includes automated exposure control, adjustment of the mA and/or kV according to patient size and/or use of iterative reconstruction technique. COMPARISON:  CT head June 07, 2022. FINDINGS: Brain: No evidence of acute infarction, hemorrhage, hydrocephalus, extra-axial collection or mass lesion/mass effect. Vascular: No hyperdense vessel. Skull: No acute fracture. Sinuses/Orbits: Largely clear sinuses.  No acute orbital findings. Other: No mastoid effusions. IMPRESSION: No acute abnormality. Electronically Signed   By: Margaretha [place] M.D.   On: 07/06/2022 15:47   DG Chest Port 1 View  Result Date: 07/06/2022 CLINICAL DATA:  Cough and weakness. EXAM: PORTABLE CHEST 1 VIEW  COMPARISON:  06/24/2022. FINDINGS: Clear lungs. Normal heart size and mediastinal contours. No pleural effusion or pneumothorax. Spinal fusion hardware is again noted. IMPRESSION: No evidence of acute cardiopulmonary disease. Electronically Signed   By: Emmit Alexanders M.D.   On: 07/06/2022 15:33    Pending Labs Unresulted  Labs (From admission, onward)     Start     Ordered   07/07/22 [id number]  Basic metabolic panel  Tomorrow morning,   R        07/06/22 2051   07/07/22 0500  CBC  Tomorrow morning,   R        07/06/22 2051   07/07/22 0500  T4, free  Tomorrow morning,   R        07/06/22 2051   07/06/22 2051  Blood gas, venous  Once,   R        07/06/22 2051            Vitals/Pain Today's Vitals   07/06/22 1745 07/06/22 1845 07/06/22 1915 07/06/22 2057  BP: (!) 140/80 (!) 155/78  (!) 156/77  Pulse: 71 79  79  Resp: 16 15  13   Temp:   98 F (36.7 C)   TempSrc:   Oral   SpO2: 100% 100%  99%  Weight:      Height:      PainSc:        Isolation Precautions No active isolations  Medications Medications  losartan (COZAAR) tablet 50 mg (has no administration in time range)  pantoprazole (PROTONIX) EC tablet 40 mg (has no administration in time range)  albuterol (PROVENTIL) (2.5 MG/3ML) 0.083% nebulizer solution 3 mL (has no administration in time range)  acetaminophen (TYLENOL) tablet 650 mg (has no administration in time range)    Or  acetaminophen (TYLENOL) suppository 650 mg (has no administration in time range)  senna-docusate (Senokot-S) tablet 1 tablet (has no administration in time range)  ondansetron (ZOFRAN) tablet 4 mg (has no administration in time range)    Or  ondansetron (ZOFRAN) injection 4 mg (has no administration in time range)  enoxaparin (LOVENOX) injection 50 mg (has no administration in time range)  naloxone (NARCAN) injection 0.4 mg (0.4 mg Intravenous Given 07/06/22 1530)    Mobility walks with person assist     Focused Assessments      R Recommendations: See Admitting Provider Note  Report given to:   Additional Notes:

## 2022-07-06 NOTE — ED Notes (Signed)
MD Opyd made aware of critical pO2 venous less than 31

## 2022-07-07 ENCOUNTER — Ambulatory Visit: Payer: 59 | Admitting: Student

## 2022-07-07 DIAGNOSIS — G928 Other toxic encephalopathy: Secondary | ICD-10-CM | POA: Diagnosis present

## 2022-07-07 DIAGNOSIS — N179 Acute kidney failure, unspecified: Secondary | ICD-10-CM | POA: Diagnosis present

## 2022-07-07 DIAGNOSIS — E669 Obesity, unspecified: Secondary | ICD-10-CM | POA: Diagnosis present

## 2022-07-07 DIAGNOSIS — Z825 Family history of asthma and other chronic lower respiratory diseases: Secondary | ICD-10-CM | POA: Diagnosis not present

## 2022-07-07 DIAGNOSIS — K5900 Constipation, unspecified: Secondary | ICD-10-CM | POA: Diagnosis present

## 2022-07-07 DIAGNOSIS — G9340 Encephalopathy, unspecified: Secondary | ICD-10-CM | POA: Diagnosis not present

## 2022-07-07 DIAGNOSIS — R339 Retention of urine, unspecified: Secondary | ICD-10-CM | POA: Diagnosis present

## 2022-07-07 DIAGNOSIS — E871 Hypo-osmolality and hyponatremia: Secondary | ICD-10-CM

## 2022-07-07 DIAGNOSIS — N182 Chronic kidney disease, stage 2 (mild): Secondary | ICD-10-CM | POA: Diagnosis present

## 2022-07-07 DIAGNOSIS — Z8249 Family history of ischemic heart disease and other diseases of the circulatory system: Secondary | ICD-10-CM | POA: Diagnosis not present

## 2022-07-07 DIAGNOSIS — Z89431 Acquired absence of right foot: Secondary | ICD-10-CM | POA: Diagnosis not present

## 2022-07-07 DIAGNOSIS — I5032 Chronic diastolic (congestive) heart failure: Secondary | ICD-10-CM | POA: Diagnosis present

## 2022-07-07 DIAGNOSIS — I130 Hypertensive heart and chronic kidney disease with heart failure and stage 1 through stage 4 chronic kidney disease, or unspecified chronic kidney disease: Secondary | ICD-10-CM | POA: Diagnosis present

## 2022-07-07 DIAGNOSIS — G4733 Obstructive sleep apnea (adult) (pediatric): Secondary | ICD-10-CM | POA: Diagnosis present

## 2022-07-07 DIAGNOSIS — F319 Bipolar disorder, unspecified: Secondary | ICD-10-CM | POA: Diagnosis not present

## 2022-07-07 DIAGNOSIS — Z833 Family history of diabetes mellitus: Secondary | ICD-10-CM | POA: Diagnosis not present

## 2022-07-07 DIAGNOSIS — D509 Iron deficiency anemia, unspecified: Secondary | ICD-10-CM | POA: Diagnosis present

## 2022-07-07 DIAGNOSIS — G894 Chronic pain syndrome: Secondary | ICD-10-CM | POA: Diagnosis present

## 2022-07-07 DIAGNOSIS — Z6838 Body mass index (BMI) 38.0-38.9, adult: Secondary | ICD-10-CM | POA: Diagnosis not present

## 2022-07-07 DIAGNOSIS — J45909 Unspecified asthma, uncomplicated: Secondary | ICD-10-CM | POA: Diagnosis present

## 2022-07-07 DIAGNOSIS — Z96611 Presence of right artificial shoulder joint: Secondary | ICD-10-CM | POA: Diagnosis present

## 2022-07-07 DIAGNOSIS — R400 Somnolence: Secondary | ICD-10-CM | POA: Diagnosis present

## 2022-07-07 DIAGNOSIS — D631 Anemia in chronic kidney disease: Secondary | ICD-10-CM | POA: Diagnosis present

## 2022-07-07 DIAGNOSIS — F3160 Bipolar disorder, current episode mixed, unspecified: Secondary | ICD-10-CM | POA: Diagnosis present

## 2022-07-07 DIAGNOSIS — E860 Dehydration: Secondary | ICD-10-CM | POA: Diagnosis present

## 2022-07-07 DIAGNOSIS — T50915A Adverse effect of multiple unspecified drugs, medicaments and biological substances, initial encounter: Secondary | ICD-10-CM | POA: Diagnosis present

## 2022-07-07 DIAGNOSIS — I10 Essential (primary) hypertension: Secondary | ICD-10-CM | POA: Diagnosis not present

## 2022-07-07 DIAGNOSIS — G629 Polyneuropathy, unspecified: Secondary | ICD-10-CM | POA: Diagnosis present

## 2022-07-07 LAB — BASIC METABOLIC PANEL
Anion gap: 14 (ref 5–15)
BUN: 14 mg/dL (ref 6–20)
CO2: 21 mmol/L — ABNORMAL LOW (ref 22–32)
Calcium: 8.9 mg/dL (ref 8.9–10.3)
Chloride: 100 mmol/L (ref 98–111)
Creatinine, Ser: 0.99 mg/dL (ref 0.44–1.00)
GFR, Estimated: >60 mL/min (ref >60)
Glucose, Bld: 94 mg/dL (ref 70–99)
Potassium: 3.8 mmol/L (ref 3.5–5.1)
Sodium: 135 mmol/L (ref 135–145)

## 2022-07-07 LAB — CBC
HCT: 26.8 % — ABNORMAL LOW (ref 36.0–46.0)
Hemoglobin: 9 g/dL — ABNORMAL LOW (ref 12.0–15.0)
MCH: 31.4 pg (ref 26.0–34.0)
MCHC: 33.6 g/dL (ref 30.0–36.0)
MCV: 93.4 fL (ref 80.0–100.0)
Platelets: 182 10*3/uL (ref 150–400)
RBC: 2.87 MIL/uL — ABNORMAL LOW (ref 3.87–5.11)
RDW: 13.7 % (ref 11.5–15.5)
WBC: 4.4 10*3/uL (ref 4.0–10.5)
nRBC: 0 % (ref 0.0–0.2)

## 2022-07-07 LAB — T4, FREE: Free T4: 0.74 ng/dL (ref 0.61–1.12)

## 2022-07-07 MED ORDER — FUROSEMIDE 40 MG PO TABS
40.0000 mg | ORAL_TABLET | Freq: Every day | ORAL | Status: DC
  Administered 2022-07-07 – 2022-07-10 (×4): 40 mg via ORAL
  Filled 2022-07-07 (×4): qty 1

## 2022-07-07 MED ORDER — LAMOTRIGINE 25 MG PO TABS
100.0000 mg | ORAL_TABLET | Freq: Every day | ORAL | Status: DC
  Administered 2022-07-07 – 2022-07-12 (×6): 100 mg via ORAL
  Filled 2022-07-07 (×6): qty 4

## 2022-07-07 MED ORDER — DULOXETINE HCL 20 MG PO CPEP
20.0000 mg | ORAL_CAPSULE | Freq: Every day | ORAL | Status: DC
  Administered 2022-07-07 – 2022-07-12 (×6): 20 mg via ORAL
  Filled 2022-07-07 (×6): qty 1

## 2022-07-07 MED ORDER — FESOTERODINE FUMARATE ER 4 MG PO TB24
4.0000 mg | ORAL_TABLET | Freq: Every day | ORAL | Status: DC
  Administered 2022-07-07 – 2022-07-08 (×2): 4 mg via ORAL
  Filled 2022-07-07 (×3): qty 1

## 2022-07-07 MED ORDER — GUAIFENESIN-DM 100-10 MG/5ML PO SYRP
5.0000 mL | ORAL_SOLUTION | ORAL | Status: DC | PRN
  Administered 2022-07-07: 10 mL via ORAL
  Administered 2022-07-07: 5 mL via ORAL
  Administered 2022-07-08 – 2022-07-09 (×4): 10 mL via ORAL
  Filled 2022-07-07 (×6): qty 10

## 2022-07-07 MED ORDER — PREGABALIN 100 MG PO CAPS
100.0000 mg | ORAL_CAPSULE | Freq: Three times a day (TID) | ORAL | Status: DC
  Administered 2022-07-07 – 2022-07-12 (×15): 100 mg via ORAL
  Filled 2022-07-07 (×15): qty 1

## 2022-07-07 MED ORDER — BUSPIRONE HCL 5 MG PO TABS
15.0000 mg | ORAL_TABLET | Freq: Three times a day (TID) | ORAL | Status: DC
  Administered 2022-07-07 – 2022-07-12 (×15): 15 mg via ORAL
  Filled 2022-07-07 (×15): qty 1

## 2022-07-07 NOTE — Progress Notes (Signed)
Pt on room air and stable. Adv doesn't sleep QHS with a CPAP at this time but will if one is required. Will continue to monitor

## 2022-07-07 NOTE — Care Management Obs Status (Cosign Needed)
[facility] NOTIFICATION   Patient Details  Name: [name] [name] MRN: [id number] Date of Birth: 1966/03/04   Medicare Observation Status Notification Given:  Yes    Curlene Labrum, RN 07/07/2022, 11:34 AM

## 2022-07-07 NOTE — Progress Notes (Signed)
[facility]                                                                               [name] [name], is a 57 y.o. female, DOB - 12-15-1965, [id number] Admit date - 07/06/2022    Outpatient Primary MD for the patient is [name] Frizzle, MD  LOS - 0  days    Brief summary    [name] [name] is a 57 y.o. female with medical history significant for asthma, hypertension, bipolar disorder, sleep apnea, CKD stage II, chronic diastolic CHF, and chronic pain who presents to the emergency department with somnolence.   Patient has been noted to be lethargic and somnolent for weeks, was evaluated by her PCP for this a few days ago, and has been worsening. Home health nurse was concerned by the patient's somnolence today and recommended transport to ED.  CT is negative for acute intracranial abnormality, and chest x-ray is negative for acute cardiopulmonary disease.   Assessment & Plan    Assessment and Plan:  Acute encephalopathy possibly from polypharmacy in the setting of mild dehydration Slightly elevated TSH , free T4 wnl. ammonia UDS and ETOH negative. VBG unremarkable.  CT head negative. Therapy evaluations ordered.  No source of infection.  Patient is more alert and answering all questions appropriately, she is not back to baseline yet.  Therapy evaluations ordered.    Hyponatremia: Resolved.    Anemia of chronic disease/ iron deficiency anemia: Iron supplementations will be added.  Hemoglobin is stable around 9.   Asthma:  Occasional dry cough.  Prn albuterol.    Hypertension:  Well controlled.    Chronic diastolic CHF; She appears euvolemic.     Chronic pain syndrome:  - restart home meds slowly.    Sleep apnea:  CPAP at night.   Fever:  Blood cultures ordered.     Estimated body mass index is 40.42 kg/m as calculated from the following:   Height as of this encounter: 5\' 4"  (1.626 m).   Weight as of this encounter: 106.8  kg.  Code Status: full code DVT Prophylaxis:     Level of Care: Level of care: Med-Surg Family Communication: Updated patient's family at bedside.   Disposition Plan:     Remains inpatient appropriate:  not back to baseline yet.   Procedures:  None.   Consultants:   None.   Antimicrobials:   Anti-infectives (From admission, onward)    None        Medications  Scheduled Meds:  busPIRone  15 mg Oral TID   DULoxetine  20 mg Oral Daily   enoxaparin (LOVENOX) injection  50 mg Subcutaneous Q24H   fesoterodine  4 mg Oral Daily   furosemide  40 mg Oral Daily   lamoTRIgine  100 mg Oral Daily   losartan  50 mg Oral Daily   pantoprazole  40 mg Oral BID   pregabalin  100 mg Oral TID   Continuous Infusions: PRN Meds:.acetaminophen **OR** acetaminophen, albuterol, ondansetron **OR** ondansetron (ZOFRAN) IV, senna-docusate    Subjective:   [name] [name] was seen and examined today.  No new complaints.   Objective:   Vitals:  07/06/22 2356 07/07/22 0100 07/07/22 0522 07/07/22 0722  BP: (!) 153/77  120/65 (!) 140/69  Pulse: 80  79 75  Resp: 16  16 18   Temp: (!) 101 F (38.3 C) 97.9 F (36.6 C) 98.1 F (36.7 C) 97.7 F (36.5 C)  TempSrc: Oral Oral Oral Oral  SpO2: 99%  95% 94%  Weight: 106.8 kg     Height:        Intake/Output Summary (Last 24 hours) at 07/07/2022 1429 Last data filed at 07/07/2022 1332 Gross per 24 hour  Intake --  Output 1600 ml  Net -1600 ml   Filed Weights   07/06/22 1505 07/06/22 2356  Weight: 108 kg 106.8 kg     Exam General: Alert and oriented x 3, NAD Cardiovascular: S1 S2 auscultated, no murmurs, RRR Respiratory: Clear to auscultation bilaterally, no wheezing, rales or rhonchi Gastrointestinal: Soft, nontender, nondistended, + bowel sounds Ext: no pedal edema bilaterally Neuro: AAOx3, Cr N's II- XII. Strength 5/5 upper and lower extremities bilaterally Skin: No rashes Psych: Normal affect and demeanor, alert and oriented  x3    Data Reviewed:  I have personally reviewed following labs and imaging studies   CBC Lab Results  Component Value Date   WBC 4.4 07/07/2022   RBC 2.87 (L) 07/07/2022   HGB 9.0 (L) 07/07/2022   HCT 26.8 (L) 07/07/2022   MCV 93.4 07/07/2022   MCH 31.4 07/07/2022   PLT 182 07/07/2022   MCHC 33.6 07/07/2022   RDW 13.7 07/07/2022   LYMPHSABS 1.0 07/06/2022   MONOABS 0.4 07/06/2022   EOSABS 0.1 07/06/2022   BASOSABS 0.0 99991111     Last metabolic panel Lab Results  Component Value Date   NA 135 07/07/2022   K 3.8 07/07/2022   CL 100 07/07/2022   CO2 21 (L) 07/07/2022   BUN 14 07/07/2022   CREATININE 0.99 07/07/2022   GLUCOSE 94 07/07/2022   GFRNONAA >60 07/07/2022   GFRAA 119 06/27/2020   CALCIUM 8.9 07/07/2022   PHOS 9.0 (H) 06/02/2022   PROT 7.2 07/06/2022   ALBUMIN 3.5 07/06/2022   LABGLOB 3.2 06/01/2022   AGRATIO 0.8 06/01/2022   BILITOT 0.2 (L) 07/06/2022   ALKPHOS 98 07/06/2022   AST 16 07/06/2022   ALT 12 07/06/2022   ANIONGAP 14 07/07/2022    CBG (last 3)  No results for input(s): "GLUCAP" in the last 72 hours.    Coagulation Profile: No results for input(s): "INR", "PROTIME" in the last 168 hours.   Radiology Studies: CT Head Wo Contrast  Result Date: 07/06/2022 CLINICAL DATA:  Motor neuron disease suspected EXAM: CT HEAD WITHOUT CONTRAST TECHNIQUE: Contiguous axial images were obtained from the base of the skull through the vertex without intravenous contrast. RADIATION DOSE REDUCTION: This exam was performed according to the departmental dose-optimization program which includes automated exposure control, adjustment of the mA and/or kV according to patient size and/or use of iterative reconstruction technique. COMPARISON:  CT head June 07, 2022. FINDINGS: Brain: No evidence of acute infarction, hemorrhage, hydrocephalus, extra-axial collection or mass lesion/mass effect. Vascular: No hyperdense vessel. Skull: No acute fracture.  Sinuses/Orbits: Largely clear sinuses.  No acute orbital findings. Other: No mastoid effusions. IMPRESSION: No acute abnormality. Electronically Signed   By: Margaretha [place] M.D.   On: 07/06/2022 15:47   DG Chest Port 1 View  Result Date: 07/06/2022 CLINICAL DATA:  Cough and weakness. EXAM: PORTABLE CHEST 1 VIEW COMPARISON:  06/24/2022. FINDINGS: Clear lungs. Normal heart size and mediastinal contours.  No pleural effusion or pneumothorax. Spinal fusion hardware is again noted. IMPRESSION: No evidence of acute cardiopulmonary disease. Electronically Signed   By: Emmit Alexanders M.D.   On: 07/06/2022 15:33       Hosie Poisson M.D. [facility] 07/07/2022, 2:29 PM  Available via Epic secure chat 7am-7pm After 7 pm, please refer to night coverage provider listed on amion.

## 2022-07-07 NOTE — TOC Initial Note (Signed)
Transition of Care [facility]) - Initial/Assessment Note    Patient Details  Name: [name] [name] MRN: [id number] Date of Birth: 07-19-1965  Transition of Care [facility]) CM/SW Contact:    Curlene Labrum, RN Phone Number: 07/07/2022, 11:49 AM  Clinical Narrative:                 CM met with the patient at the bedside to provide Medicare Observation - patient admitting to the hospital for Lethargy for weeks.  The patient states that she does not use ETOH nor smoking.  The patient states that she has been taking her medication on schedule at home and uses a pill organizer for her medications.  DME at the home include WC, RW, Cane and 3:1.  The patient states that her CPAP has been ordered for home but she has not received it yet.  The patient utilizes transportation through her Medicare.  Medicaid transportation resources will be provided in the AVS for the patient.;  Patient is active with [facility] for RN, PT, and OT.  Orders will be updated.  No other TOC needs at this time.  Expected Discharge Plan: [place] Barriers to Discharge: Continued Medical Work up   Patient Goals and CMS Choice Patient states their goals for this hospitalization and ongoing recovery are:: To return home with mom CMS Medicare.gov Compare Post Acute Care list provided to:: Patient Choice offered to / list presented to : Patient [place] ownership interest in [facility].provided to:: Patient    Expected Discharge Plan and Services   Discharge Planning Services: CM Consult Post Acute Care Choice: Resumption of Svcs/PTA Provider, Home Health Living arrangements for the past 2 months: [place] Arranged: RN, PT, OT       Representative spoke with at [place]: Tommi Rumps, [facility] with [facility] is aware that patient is readmitted to the hospital - active for PT/OT/ RN  Prior Living Arrangements/Services Living arrangements for the past 2  months: Single Family Home Lives with:: Parents Patient language and need for interpreter reviewed:: Yes Do you feel safe going back to the place where you live?: Yes      Need for Family Participation in Patient Care: Yes (Comment) Care giver support system in place?: Yes (comment) Current home services: Home PT, Home OT, Home RN, DME (DME at home includes CPAP (order pending for home per patient), WC, RW, Cane, 3:1)    Activities of Daily Living Home Assistive Devices/Equipment: Environmental consultant (specify type) ADL Screening (condition at time of admission) Patient's cognitive ability adequate to safely complete daily activities?: Yes Is the patient deaf or have difficulty hearing?: No Does the patient have difficulty seeing, even when wearing glasses/contacts?: No Does the patient have difficulty concentrating, remembering, or making decisions?: No Patient able to express need for assistance with ADLs?: Yes Does the patient have difficulty dressing or bathing?: No Independently performs ADLs?: No Communication: Independent Dressing (OT): Needs assistance Grooming: Needs assistance Is this a change from baseline?: Pre-admission baseline Feeding: Independent Bathing: Needs assistance Is this a change from baseline?: Pre-admission baseline Toileting: Needs assistance Is this a change from baseline?: Pre-admission baseline In/Out Bed: Needs assistance Is this a change from baseline?: Pre-admission baseline Walks in Home: Needs assistance Is this a change from baseline?: Pre-admission baseline Does the patient have difficulty walking or  climbing stairs?: Yes Weakness of Legs: Both Weakness of Arms/Hands: None  Permission Sought/Granted Permission sought to share information with : Case Manager, Chartered certified accountant granted to share information with : Yes, Verbal Permission Granted     Permission granted to share info w AGENCY: Patient is active with  [facility]  Permission granted to share info w Relationship: mother, [phone]     Emotional Assessment Appearance:: Appears stated age Attitude/Demeanor/Rapport: Engaged Affect (typically observed): Accepting Orientation: : Oriented to Self, Oriented to Place, Oriented to  Time, Oriented to Situation Alcohol / Substance Use: Not Applicable (Patient denies smoking nor ETOH) Psych Involvement: No (comment)  Admission diagnosis:  Polypharmacy [Z79.899] Acute encephalopathy [G93.40] Patient Active Problem List   Diagnosis Date Noted   Acute encephalopathy 07/06/2022   Chronic diastolic CHF (congestive heart failure) ([place]) 07/06/2022   Bilateral leg edema 06/27/2022   Anemia [id number]   Acute metabolic encephalopathy 0000000   Right foot infection 05/27/2022   Morbid obesity ([place]) 05/26/2022   Cellulitis of right foot 05/22/2022   Severe sepsis ([place]) 05/22/2022   AMS (altered mental status) 05/22/2022   Septic shock ([place]) 05/22/2022   Abscess of right foot 05/22/2022   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   Diarrhea in adult patient    Weakness 06/30/2021   Gastroenteritis, infectious 06/30/2021   CKD (chronic kidney disease) stage 2, GFR 60-89 ml/min 06/30/2021   Hyponatremia 06/30/2021   Open wound of right foot, initial encounter 10/21/2020   Finger ulcer ([place]) 10/21/2020   Syncope 08/26/2019   Non-healing open wound of toe 05/15/2019   Osteomyelitis ([place]) 05/15/2019   Palpitations 10/26/2018   Foot ulcer ([place]) 01/31/2018   Hyperglycemia 01/31/2018   Left leg cellulitis 09/24/2017   Syncope and collapse    Near syncope 11/23/2016   Abdominal pain 09/23/2016   Anxiety 09/23/2016   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[place]) 09/23/2016   Constipation 09/23/2016   DDD (degenerative disc disease), lumbosacral 09/23/2016   Migraines 09/23/2016   Myelopathy ([place]) 09/23/2016   Nausea 09/23/2016   Post laminectomy syndrome 09/23/2016    Pseudoarthrosis of lumbar spine 09/23/2016   Shortness of breath 09/23/2016   Sleep apnea 09/23/2016   Spinal stenosis of lumbar region 09/23/2016   Vertigo 09/23/2016   Hallux malleus 11/15/2015   Chronic pain syndrome    GERD (gastroesophageal reflux disease)    Depression    Rash 11/14/2015   Toe ulcer, right ([place]) 04/11/2015   Cellulitis of toe of right foot 04/11/2015   Nonspecific chest pain    Chest pain 10/26/2014   Obesity (BMI 30-39.9) 10/26/2014   Hypokalemia 10/26/2014   Manic bipolar I disorder ([place]) 10/26/2014   Atypical angina 10/26/2014   Tardive dyskinesia 08/26/2011   Manic bipolar I disorder with rapid cycling ([place]) 05/18/2011    Class: Acute   Asthma 02/24/2011   Essential hypertension 02/24/2011   History of migraine headaches 02/24/2011   PCP:  [name], MD Pharmacy:   [facility] [place] [address] [place] [address] Phone: [phone] Fax: [place], [place] - [address] [place] [address] Phone: [phone] Fax: [phone]  [id number] - [place], [place] - [address] [place] [address] Phone: [phone] Fax: [phone]  [id number] - [place], [place] - [address] [place] [address] Phone: [phone] Fax: [phone]     Social Determinants of Health (SDOH) Social History: SDOH Screenings   Food Insecurity: No Food Insecurity (06/24/2022)  Housing: Low Risk  (06/24/2022)  Transportation Needs: No Transportation Needs (06/24/2022)  Utilities: Not At Risk (06/24/2022)  Alcohol Screen: Low Risk  (08/21/2020)  Depression (PHQ2-9): Low Risk  (06/21/2022)  Financial Resource Strain: Low Risk  (06/18/2022)  Physical Activity: Inactive (08/21/2020)  Social Connections: Socially Isolated  (08/21/2020)  Stress: No Stress Concern Present (08/21/2020)  Tobacco Use: Low Risk  (07/06/2022)   SDOH Interventions:     Readmission Risk Interventions    06/25/2022    1:44 PM  Readmission Risk Prevention Plan  Transportation Screening Complete  PCP or Specialist Appt within 3-5 Days Complete  HRI or Home Care Consult Complete  Palliative Care Screening Not Applicable  Medication Review (RN Care Manager) Complete

## 2022-07-08 DIAGNOSIS — G9340 Encephalopathy, unspecified: Secondary | ICD-10-CM | POA: Diagnosis not present

## 2022-07-08 DIAGNOSIS — I5032 Chronic diastolic (congestive) heart failure: Secondary | ICD-10-CM | POA: Diagnosis not present

## 2022-07-08 DIAGNOSIS — I10 Essential (primary) hypertension: Secondary | ICD-10-CM | POA: Diagnosis not present

## 2022-07-08 DIAGNOSIS — E871 Hypo-osmolality and hyponatremia: Secondary | ICD-10-CM | POA: Diagnosis not present

## 2022-07-08 LAB — URINALYSIS, W/ REFLEX TO CULTURE (INFECTION SUSPECTED)
Bilirubin Urine: NEGATIVE
Glucose, UA: NEGATIVE mg/dL
Ketones, ur: NEGATIVE mg/dL
Leukocytes,Ua: NEGATIVE
Nitrite: NEGATIVE
Protein, ur: 100 mg/dL — AB
RBC / HPF: >50 RBC/hpf (ref 0–5)
Specific Gravity, Urine: 1.009 (ref 1.005–1.030)
pH: 6 (ref 5.0–8.0)

## 2022-07-08 LAB — CBC WITH DIFFERENTIAL/PLATELET
Abs Immature Granulocytes: 0.01 10*3/uL (ref 0.00–0.07)
Basophils Absolute: 0 10*3/uL (ref 0.0–0.1)
Basophils Relative: 1 %
Eosinophils Absolute: 0.1 10*3/uL (ref 0.0–0.5)
Eosinophils Relative: 3 %
HCT: 27.6 % — ABNORMAL LOW (ref 36.0–46.0)
Hemoglobin: 8.9 g/dL — ABNORMAL LOW (ref 12.0–15.0)
Immature Granulocytes: 0 %
Lymphocytes Relative: 30 %
Lymphs Abs: 1.1 10*3/uL (ref 0.7–4.0)
MCH: 30.6 pg (ref 26.0–34.0)
MCHC: 32.2 g/dL (ref 30.0–36.0)
MCV: 94.8 fL (ref 80.0–100.0)
Monocytes Absolute: 0.5 10*3/uL (ref 0.1–1.0)
Monocytes Relative: 12 %
Neutro Abs: 2 10*3/uL (ref 1.7–7.7)
Neutrophils Relative %: 54 %
Platelets: 197 10*3/uL (ref 150–400)
RBC: 2.91 MIL/uL — ABNORMAL LOW (ref 3.87–5.11)
RDW: 13.6 % (ref 11.5–15.5)
WBC: 3.8 10*3/uL — ABNORMAL LOW (ref 4.0–10.5)
nRBC: 0 % (ref 0.0–0.2)

## 2022-07-08 LAB — BASIC METABOLIC PANEL
Anion gap: 10 (ref 5–15)
BUN: 19 mg/dL (ref 6–20)
CO2: 22 mmol/L (ref 22–32)
Calcium: 8.8 mg/dL — ABNORMAL LOW (ref 8.9–10.3)
Chloride: 102 mmol/L (ref 98–111)
Creatinine, Ser: 1.25 mg/dL — ABNORMAL HIGH (ref 0.44–1.00)
GFR, Estimated: 50 mL/min — ABNORMAL LOW (ref >60)
Glucose, Bld: 101 mg/dL — ABNORMAL HIGH (ref 70–99)
Potassium: 3.5 mmol/L (ref 3.5–5.1)
Sodium: 134 mmol/L — ABNORMAL LOW (ref 135–145)

## 2022-07-08 MED ORDER — TOPIRAMATE 25 MG PO TABS
50.0000 mg | ORAL_TABLET | Freq: Every day | ORAL | Status: DC
  Administered 2022-07-08 – 2022-07-09 (×2): 50 mg via ORAL
  Filled 2022-07-08 (×2): qty 2

## 2022-07-08 MED ORDER — OXYCODONE-ACETAMINOPHEN 5-325 MG PO TABS
1.0000 | ORAL_TABLET | Freq: Three times a day (TID) | ORAL | Status: DC | PRN
  Administered 2022-07-08 (×2): 1 via ORAL
  Administered 2022-07-09 (×2): 2 via ORAL
  Administered 2022-07-09: 1 via ORAL
  Administered 2022-07-10 – 2022-07-11 (×3): 2 via ORAL
  Filled 2022-07-08: qty 2
  Filled 2022-07-08: qty 1
  Filled 2022-07-08: qty 2
  Filled 2022-07-08: qty 1
  Filled 2022-07-08: qty 2
  Filled 2022-07-08: qty 1
  Filled 2022-07-08 (×2): qty 2

## 2022-07-08 NOTE — Progress Notes (Signed)
[facility]                                                                               [name], is a 57 y.o. female, DOB - [birth year], [id number] Admit date - 07/06/2022    Outpatient Primary MD for the patient is [name], MD  LOS - 1  days    Brief summary    [name] [name] is a 57 y.o. female with medical history significant for asthma, hypertension, bipolar disorder, sleep apnea, CKD stage II, chronic diastolic CHF, and chronic pain who presents to the emergency department with somnolence.   Patient has been noted to be lethargic and somnolent for weeks, was evaluated by her PCP for this a few days ago, and has been worsening. Home health nurse was concerned by the patient's somnolence today and recommended transport to ED.  CT is negative for acute intracranial abnormality, and chest x-ray is negative for acute cardiopulmonary disease.   Assessment & Plan    Assessment and Plan:  Acute encephalopathy possibly from polypharmacy in the setting of mild dehydration Slightly elevated TSH , free T4 wnl. ammonia UDS and ETOH negative. VBG unremarkable.  CT head negative. Therapy evaluations ordered.  No source of infection.  Patient is more alert and answering all questions appropriately, Therapy evaluations ordered and recommended HOME HEALTH PT.     Hyponatremia: Resolved.    Anemia of chronic disease/ iron deficiency anemia: Iron supplementations will be added.  Hemoglobin is stable around 9.   Asthma:  Occasional dry cough.  Prn albuterol.    Hypertension:  Well controlled.    Chronic diastolic CHF; She appears euvolemic.  Resume lasix.   Peripheral neuropathy:  On lyrica.     Urinary retention:  Unclear etiology.  In an dout done and 1400 ml out.  UA and urine cultures done.     Chronic pain syndrome:  - restart home meds slowly.  Decreased the dose of oxy/ percocet to 5 mg -325 mg every 8 hours prn.     Sleep apnea:  CPAP at night.   Fever:  Blood cultures ordered.    Bipolar disorder:  Currently on Lamictal, Cymbalta and Buspar.      Estimated body mass index is 40.42 kg/m as calculated from the following:   Height as of this encounter: 5\' 4"  (1.626 m).   Weight as of this encounter: 106.8 kg.  Code Status: full code DVT Prophylaxis:     Level of Care: Level of care: Med-Surg Family Communication: none at bedside.   Disposition Plan:     Remains inpatient appropriate:  not back to baseline yet.   Procedures:  None.   Consultants:   None.   Antimicrobials:   Anti-infectives (From admission, onward)    None        Medications  Scheduled Meds:  busPIRone  15 mg Oral TID   DULoxetine  20 mg Oral Daily   enoxaparin (LOVENOX) injection  50 mg Subcutaneous Q24H   fesoterodine  4 mg Oral Daily   furosemide  40 mg Oral Daily   lamoTRIgine  100 mg Oral Daily   losartan  50  mg Oral Daily   pantoprazole  40 mg Oral BID   pregabalin  100 mg Oral TID   topiramate  50 mg Oral Daily   Continuous Infusions: PRN Meds:.albuterol, guaiFENesin-dextromethorphan, ondansetron **OR** ondansetron (ZOFRAN) IV, oxyCODONE-acetaminophen, senna-docusate    Subjective:   [name] [name] was seen and examined today.  Not feeling well, chills today. Unable to urinate.  Objective:   Vitals:   07/07/22 2125 07/07/22 2317 07/08/22 0444 07/08/22 0803  BP: 133/68  (!) 144/79 132/86  Pulse: 76 73 65 69  Resp: 17 18 16 18   Temp: 97.9 F (36.6 C)  97.8 F (36.6 C) 97.8 F (36.6 C)  TempSrc: Oral   Oral  SpO2: 96% 97% 98% 97%  Weight:      Height:        Intake/Output Summary (Last 24 hours) at 07/08/2022 1435 Last data filed at 07/08/2022 1426 Gross per 24 hour  Intake 633 ml  Output 1400 ml  Net -767 ml    Filed Weights   07/06/22 1505 07/06/22 2356  Weight: 108 kg 106.8 kg     Exam General exam: Appears calm and comfortable  Respiratory system: Clear to  auscultation. Respiratory effort normal. Cardiovascular system: S1 & S2 heard, RRR. No JVD, murmurs, Gastrointestinal system: Abdomen is nondistended, soft and nontender.  Central nervous system: Alert and oriented. No focal neurological deficits. Extremities: Symmetric 5 x 5 power. Skin: No rashes, Psychiatry: Mood & affect appropriate.    Data Reviewed:  I have personally reviewed following labs and imaging studies   CBC Lab Results  Component Value Date   WBC 3.8 (L) 07/08/2022   RBC 2.91 (L) 07/08/2022   HGB 8.9 (L) 07/08/2022   HCT 27.6 (L) 07/08/2022   MCV 94.8 07/08/2022   MCH 30.6 07/08/2022   PLT 197 07/08/2022   MCHC 32.2 07/08/2022   RDW 13.6 07/08/2022   LYMPHSABS 1.1 07/08/2022   MONOABS 0.5 07/08/2022   EOSABS 0.1 07/08/2022   BASOSABS 0.0 [id number]     Last metabolic panel Lab Results  Component Value Date   NA 134 (L) 07/08/2022   K 3.5 07/08/2022   CL 102 07/08/2022   CO2 22 07/08/2022   BUN 19 07/08/2022   CREATININE 1.25 (H) 07/08/2022   GLUCOSE 101 (H) 07/08/2022   GFRNONAA 50 (L) 07/08/2022   GFRAA 119 06/27/2020   CALCIUM 8.8 (L) 07/08/2022   PHOS 9.0 (H) 06/02/2022   PROT 7.2 07/06/2022   ALBUMIN 3.5 07/06/2022   LABGLOB 3.2 06/01/2022   AGRATIO 0.8 06/01/2022   BILITOT 0.2 (L) 07/06/2022   ALKPHOS 98 07/06/2022   AST 16 07/06/2022   ALT 12 07/06/2022   ANIONGAP 10 07/08/2022    CBG (last 3)  No results for input(s): "GLUCAP" in the last 72 hours.    Coagulation Profile: No results for input(s): "INR", "PROTIME" in the last 168 hours.   Radiology Studies: CT Head Wo Contrast  Result Date: 07/06/2022 CLINICAL DATA:  Motor neuron disease suspected EXAM: CT HEAD WITHOUT CONTRAST TECHNIQUE: Contiguous axial images were obtained from the base of the skull through the vertex without intravenous contrast. RADIATION DOSE REDUCTION: This exam was performed according to the departmental dose-optimization program which includes automated  exposure control, adjustment of the mA and/or kV according to patient size and/or use of iterative reconstruction technique. COMPARISON:  CT head June 07, 2022. FINDINGS: Brain: No evidence of acute infarction, hemorrhage, hydrocephalus, extra-axial collection or mass lesion/mass effect. Vascular: No hyperdense vessel. Skull:  No acute fracture. Sinuses/Orbits: Largely clear sinuses.  No acute orbital findings. Other: No mastoid effusions. IMPRESSION: No acute abnormality. Electronically Signed   By: Margaretha Sheffield M.D.   On: 07/06/2022 15:47   DG Chest Port 1 View  Result Date: 07/06/2022 CLINICAL DATA:  Cough and weakness. EXAM: PORTABLE CHEST 1 VIEW COMPARISON:  06/24/2022. FINDINGS: Clear lungs. Normal heart size and mediastinal contours. No pleural effusion or pneumothorax. Spinal fusion hardware is again noted. IMPRESSION: No evidence of acute cardiopulmonary disease. Electronically Signed   By: Emmit Alexanders M.D.   On: 07/06/2022 15:33       Hosie Poisson M.D. [facility] 07/08/2022, 2:35 PM  Available via Epic secure chat 7am-7pm After 7 pm, please refer to night coverage provider listed on amion.

## 2022-07-08 NOTE — Plan of Care (Signed)

## 2022-07-08 NOTE — Evaluation (Signed)
Physical Therapy Evaluation Patient Details Name: [name] [name] MRN: [id number] DOB: [birth year] Today's Date: 07/08/2022  History of Present Illness  57 yo female presents to [facility] on 3/19 with lethargy, bilat shoulder and R hip pain. Workup for acute encephalopathy, possibly due to polypharmacy. Recent admission 3/7-3/10 for HF. 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  Clinical Impression   Pt presents with generalized weakness, body-wide chronic pain, impaired activity tolerance vs baseline. Pt to benefit from acute PT to address deficits. Pt ambulated short room distance, distance limited by no cam boot in room so PT limited pt to close to bed for safety and foot protection (also pt heel WB only). Pt also limited by fatigue and pain, states she is not getting her typical pain medication at this time. PT encouraged pt to get family member to bring cam walker boot into hospital for safe mobility and mobility progression. PT to progress mobility as tolerated, and will continue to follow acutely.         Recommendations for follow up therapy are one component of a multi-disciplinary discharge planning process, led by the attending physician.  Recommendations may be updated based on patient status, additional functional criteria and insurance authorization.  Follow Up Recommendations Home health PT Can patient physically be transported by private vehicle: No    Assistance Recommended at Discharge Set up Supervision/Assistance  Patient can return home with the following  A little help with walking and/or transfers;A little help with bathing/dressing/bathroom;Assistance with cooking/housework;Assist for transportation;Help with stairs or ramp for entrance    Equipment Recommendations None recommended by PT  Recommendations for Other Services       Functional Status Assessment Patient has had a recent decline  in their functional status and demonstrates the ability to make significant improvements in function in a reasonable and predictable amount of time.     Precautions / Restrictions Precautions Precautions: Fall Other Brace: CAM walker boot (not brought to hospital, PT requested pt have family member bring it to the hospital for safe mobility) Restrictions Weight Bearing Restrictions: Yes RUE Weight Bearing: Weight bearing as tolerated      Mobility  Bed Mobility Overal bed mobility: Needs Assistance Bed Mobility: Supine to Sit, Sit to Supine     Supine to sit: Min assist Sit to supine: Min assist   General bed mobility comments: assist for trunk elevation, lowering, and boost up in bed upon return to supine.    Transfers Overall transfer level: Needs assistance Equipment used: Rolling walker (2 wheels) Transfers: Sit to/from Stand Sit to Stand: Min guard           General transfer comment: close guard for safety, cues for heel WB    Ambulation/Gait Ambulation/Gait assistance: Min guard Gait Distance (Feet): 5 Feet Assistive device: Rolling walker (2 wheels) Gait Pattern/deviations: Step-through pattern, Decreased stride length, Trunk flexed Gait velocity: decr     General Gait Details: close guard for safety, pt using heel WB through RLE, pt does not have CAM boot with her so distance limited given protecting this foot  Stairs            Wheelchair Mobility    Modified Rankin (Stroke Patients Only)       Balance Overall balance assessment: Needs assistance Sitting-balance support: No upper extremity supported, Feet supported Sitting balance-Leahy Scale: Good     Standing balance support: Bilateral upper extremity supported, Reliant on assistive device  for balance Standing balance-Leahy Scale: Poor Standing balance comment: reliant on external support                             Pertinent Vitals/Pain Pain Assessment Pain Assessment:  0-10 Pain Score: 7  Pain Location: "hurting so bad all over" Pain Descriptors / Indicators: Aching, Sore Pain Intervention(s): Limited activity within patient's tolerance, Monitored during session, Repositioned    Home Living Family/patient expects to be discharged to:: Private residence Living Arrangements: Parent Available Help at Discharge: Family Type of Home: House Home Access: Ramped entrance Entrance Stairs-Rails: Left     Home Layout: One level Home Equipment: Conservation officer, nature (2 wheels);Cane - single point;BSC/3in1 Additional Comments: Pt lives with her mom, ramp to enter, has RW, knees scooter.    Prior Function Prior Level of Function : Needs assist             Mobility Comments: pt reports using RW to mobilize, WBAT in CAM boot ADLs Comments: PRN help for dressing and bathing     Hand Dominance   Dominant Hand: Right    Extremity/Trunk Assessment   Upper Extremity Assessment Upper Extremity Assessment: Defer to OT evaluation RUE Deficits / Details: history of R shoulder surgery, is getting therapy for shoulder now per pt report    Lower Extremity Assessment Lower Extremity Assessment: Overall WFL for tasks assessed    Cervical / Trunk Assessment Cervical / Trunk Assessment: Normal  Communication   Communication: No difficulties  Cognition Arousal/Alertness: Awake/alert Behavior During Therapy: Flat affect Overall Cognitive Status: Within Functional Limits for tasks assessed                                          General Comments      Exercises     Assessment/Plan    PT Assessment Patient needs continued PT services  PT Problem List Decreased strength;Decreased range of motion;Decreased balance;Decreased activity tolerance;Decreased knowledge of use of DME;Decreased knowledge of precautions;Decreased mobility       PT Treatment Interventions DME instruction;Gait training;Stair training;Functional mobility  training;Therapeutic activities;Therapeutic exercise;Balance training;Neuromuscular re-education;Patient/family education    PT Goals (Current goals can be found in the Care Plan section)  Acute Rehab PT Goals Patient Stated Goal: go home when I feel better PT Goal Formulation: With patient Time For Goal Achievement: 07/22/22 Potential to Achieve Goals: Good    Frequency Min 3X/week     Co-evaluation               AM-PAC PT "6 Clicks" Mobility  Outcome Measure Help needed turning from your back to your side while in a flat bed without using bedrails?: A Little Help needed moving from lying on your back to sitting on the side of a flat bed without using bedrails?: A Little Help needed moving to and from a bed to a chair (including a wheelchair)?: A Little Help needed standing up from a chair using your arms (e.g., wheelchair or bedside chair)?: A Little Help needed to walk in hospital room?: A Little Help needed climbing 3-5 steps with a railing? : A Little 6 Click Score: 18    End of Session   Activity Tolerance: Patient limited by fatigue;Patient tolerated treatment well Patient left: in bed;with call bell/phone within reach;with chair alarm set Nurse Communication: Mobility status PT Visit Diagnosis: Muscle weakness (generalized) (  M62.81);Difficulty in walking, not elsewhere classified (R26.2)    Time: 0822-0839 PT Time Calculation (min) (ACUTE ONLY): 17 min   Charges:   PT Evaluation $PT Eval Low Complexity: 1 Low          Keviana Guida S, PT DPT Acute Rehabilitation Services Pager [phone]  Office [phone]   Roxine Caddy E Ruffin Pyo 07/08/2022, 10:54 AM

## 2022-07-08 NOTE — Progress Notes (Addendum)
2200 Patient declined CPAP for tonight.  2210 During nursing assessment, Patient Galletti requested the following: Appointment with Dr. Rexene Alberts [facility] Neurologist), for problems dialing phone numbers and explaining herself clearly. Continue to monitor.  0130 Patient has not urinated since 1520 on 07/08/2022.  Bladder scan shows 285mL in bladder.  Contacted Dr. Velia Meyer via secure text.  Orders given.  Continue to monitor.

## 2022-07-08 NOTE — Progress Notes (Signed)
Pt refused CPAP for tonight. Will call if she changes her mind.

## 2022-07-09 ENCOUNTER — Other Ambulatory Visit: Payer: Self-pay

## 2022-07-09 ENCOUNTER — Inpatient Hospital Stay (HOSPITAL_COMMUNITY): Payer: 59

## 2022-07-09 DIAGNOSIS — G9340 Encephalopathy, unspecified: Secondary | ICD-10-CM | POA: Diagnosis not present

## 2022-07-09 DIAGNOSIS — I5032 Chronic diastolic (congestive) heart failure: Secondary | ICD-10-CM | POA: Diagnosis not present

## 2022-07-09 DIAGNOSIS — E871 Hypo-osmolality and hyponatremia: Secondary | ICD-10-CM | POA: Diagnosis not present

## 2022-07-09 DIAGNOSIS — I10 Essential (primary) hypertension: Secondary | ICD-10-CM | POA: Diagnosis not present

## 2022-07-09 LAB — BASIC METABOLIC PANEL
Anion gap: 13 (ref 5–15)
BUN: 21 mg/dL — ABNORMAL HIGH (ref 6–20)
CO2: 21 mmol/L — ABNORMAL LOW (ref 22–32)
Calcium: 8.7 mg/dL — ABNORMAL LOW (ref 8.9–10.3)
Chloride: 100 mmol/L (ref 98–111)
Creatinine, Ser: 1.23 mg/dL — ABNORMAL HIGH (ref 0.44–1.00)
GFR, Estimated: 51 mL/min — ABNORMAL LOW (ref >60)
Glucose, Bld: 99 mg/dL (ref 70–99)
Potassium: 3.2 mmol/L — ABNORMAL LOW (ref 3.5–5.1)
Sodium: 134 mmol/L — ABNORMAL LOW (ref 135–145)

## 2022-07-09 LAB — FECAL GLOBIN BY IMMUNOCHEMISTRY
FECAL GLOBIN RESULT:: DETECTED — AB
MICRO NUMBER:: 14723642
SPECIMEN QUALITY:: ADEQUATE

## 2022-07-09 LAB — HOUSE ACCOUNT TRACKING

## 2022-07-09 LAB — GROUP A STREP BY PCR: Group A Strep by PCR: NOT DETECTED

## 2022-07-09 MED ORDER — HALOPERIDOL 1 MG PO TABS: 2.0000 mg | ORAL_TABLET | Freq: Every evening | ORAL | Status: DC | PRN

## 2022-07-09 MED ORDER — ESCITALOPRAM OXALATE 10 MG PO TABS
10.0000 mg | ORAL_TABLET | Freq: Every day | ORAL | Status: DC
  Administered 2022-07-09 – 2022-07-12 (×4): 10 mg via ORAL
  Filled 2022-07-09 (×4): qty 1

## 2022-07-09 MED ORDER — CHLORHEXIDINE GLUCONATE CLOTH 2 % EX PADS
6.0000 | MEDICATED_PAD | Freq: Every day | CUTANEOUS | Status: DC
  Administered 2022-07-09 – 2022-07-12 (×4): 6 via TOPICAL

## 2022-07-09 MED ORDER — POTASSIUM CHLORIDE CRYS ER 20 MEQ PO TBCR
40.0000 meq | EXTENDED_RELEASE_TABLET | Freq: Once | ORAL | Status: AC
  Administered 2022-07-09: 40 meq via ORAL
  Filled 2022-07-09: qty 2

## 2022-07-09 MED ORDER — LORATADINE 10 MG PO TABS
10.0000 mg | ORAL_TABLET | Freq: Every day | ORAL | Status: DC
  Administered 2022-07-09 – 2022-07-12 (×4): 10 mg via ORAL
  Filled 2022-07-09 (×4): qty 1

## 2022-07-09 MED ORDER — TRAZODONE HCL 50 MG PO TABS
100.0000 mg | ORAL_TABLET | Freq: Every day | ORAL | Status: DC
  Administered 2022-07-09 – 2022-07-11 (×4): 100 mg via ORAL
  Filled 2022-07-09 (×4): qty 2

## 2022-07-09 MED ORDER — PHENOL 1.4 % MT LIQD
1.0000 | OROMUCOSAL | Status: DC | PRN
  Administered 2022-07-09 – 2022-07-11 (×3): 1 via OROMUCOSAL
  Filled 2022-07-09: qty 177

## 2022-07-09 MED ORDER — POLYVINYL ALCOHOL 1.4 % OP SOLN
1.0000 [drp] | OPHTHALMIC | Status: DC | PRN
  Filled 2022-07-09: qty 15

## 2022-07-09 MED ORDER — METHYLNALTREXONE BROMIDE 150 MG PO TABS: 450.0000 mg | ORAL_TABLET | Freq: Every morning | ORAL | Status: DC

## 2022-07-09 MED ORDER — FAMOTIDINE 20 MG PO TABS
40.0000 mg | ORAL_TABLET | Freq: Every day | ORAL | Status: DC
  Administered 2022-07-09 – 2022-07-11 (×3): 40 mg via ORAL
  Filled 2022-07-09 (×3): qty 2

## 2022-07-09 MED ORDER — TOPIRAMATE 25 MG PO TABS
50.0000 mg | ORAL_TABLET | Freq: Two times a day (BID) | ORAL | Status: DC
  Administered 2022-07-09 – 2022-07-12 (×7): 50 mg via ORAL
  Filled 2022-07-09 (×8): qty 2

## 2022-07-09 MED ORDER — BENZONATATE 100 MG PO CAPS
200.0000 mg | ORAL_CAPSULE | Freq: Three times a day (TID) | ORAL | Status: DC | PRN
  Administered 2022-07-09 – 2022-07-10 (×3): 200 mg via ORAL
  Filled 2022-07-09 (×3): qty 2

## 2022-07-09 NOTE — Plan of Care (Signed)

## 2022-07-09 NOTE — Progress Notes (Signed)
TR[facility] night cross cover note:   I was contacted by RN with the following updates regarding the patient's urine output:  Following spontaneous urination of 300 cc around 1500 on 07/08/2022, the patient has not urinated in the interval, prompting bladder scan revealing 295 cc around 130 AM on 3/22.   I subsequently placed order for scheduled q6 hour bladder scans to continue to closely monitor her urine output, and also ordered bmp for the AM to closely monitor ensuing renal function.    Babs Bertin, DO Hospitalist

## 2022-07-09 NOTE — Care Management Important Message (Signed)
Important Message  Patient Details  Name: [name] [name] MRN: [id number] Date of Birth: 12/24/1965   Medicare Important Message Given:  Yes     Orbie Pyo 07/09/2022, 12:21 PM

## 2022-07-09 NOTE — Progress Notes (Signed)
[facility]                                                                               [name] [name], is a 57 y.o. female, DOB - 17-Oct-1965, [id number] Admit date - 07/06/2022    Outpatient Primary MD for the patient is [name], MD  LOS - 2  days    Brief summary    [name] [name] is a 57 y.o. female with medical history significant for asthma, hypertension, bipolar disorder, sleep apnea, CKD stage II, chronic diastolic CHF, and chronic pain who presents to the emergency department with somnolence.   Patient has been noted to be lethargic and somnolent for weeks, was evaluated by her PCP for this a few days ago, and has been worsening. Home health nurse was concerned by the patient's somnolence today and recommended transport to ED.  CT is negative for acute intracranial abnormality, and chest x-ray is negative for acute cardiopulmonary disease.   Assessment & Plan    Assessment and Plan:  Acute encephalopathy possibly from polypharmacy in the setting of mild dehydration Slightly elevated TSH , free T4 wnl. ammonia UDS and ETOH negative. VBG unremarkable.  CT head negative. Therapy evaluations ordered.  No source of infection.  Patient is more alert and answering all questions appropriately, Therapy evaluations ordered and recommended HOME HEALTH PT.     Hyponatremia: Resolved.    Anemia of chronic disease/ iron deficiency anemia: Iron supplementations will be added.  Hemoglobin is stable around 9.   Asthma:  Occasional dry cough.  Prn albuterol.    Hypertension:  Well controlled.  No changes in meds.    Chronic diastolic CHF; She appears euvolemic.  Resume lasix.   Peripheral neuropathy:  On lyrica.     Urinary retention:  Unclear etiology.  In an dout done  twice and UA is negative for infection.  We have discontinued her Vesicare.     Chronic pain syndrome:  - restart home meds slowly.  Decreased the dose of  oxy/ percocet to 5 mg -325 mg every 8 hours prn.    Sleep apnea:  CPAP at night.   Fever:  Blood cultures ordered.    Bipolar disorder:  Currently on Lamictal, Cymbalta and Buspar.   Sore throat, congestion and cough Claritin and Group A strep PCR ordered.    Estimated body mass index is 40.42 kg/m as calculated from the following:   Height as of this encounter: 5\' 4"  (1.626 m).   Weight as of this encounter: 106.8 kg.  Code Status: full code DVT Prophylaxis:     Level of Care: Level of care: Med-Surg Family Communication: none at bedside.   Disposition Plan:     Remains inpatient appropriate:  urinary retention.   Procedures:  None.   Consultants:   None.   Antimicrobials:   Anti-infectives (From admission, onward)    None        Medications  Scheduled Meds:  busPIRone  15 mg Oral TID   DULoxetine  20 mg Oral Daily   enoxaparin (LOVENOX) injection  50 mg Subcutaneous Q24H   escitalopram  10 mg Oral Daily   famotidine  40 mg Oral QHS   furosemide  40 mg Oral Daily   lamoTRIgine  100 mg Oral Daily   loratadine  10 mg Oral Daily   losartan  50 mg Oral Daily   pantoprazole  40 mg Oral BID   pregabalin  100 mg Oral TID   topiramate  50 mg Oral BID   topiramate  50 mg Oral Daily   traZODone  100 mg Oral QHS   Continuous Infusions: PRN Meds:.albuterol, benzonatate, guaiFENesin-dextromethorphan, haloperidol, ondansetron **OR** ondansetron (ZOFRAN) IV, oxyCODONE-acetaminophen, phenol, polyvinyl alcohol, senna-docusate    Subjective:   [name] [name] was seen and examined today.  Congested, unable to urinate this morning.   Objective:   Vitals:   07/08/22 2054 07/09/22 0429 07/09/22 0812 07/09/22 1154  BP: 127/81 130/76 (!) 150/82 130/69  Pulse: 78 81 80 77  Resp: 18 17 18 16   Temp: 98.2 F (36.8 C) 98.8 F (37.1 C) 99.2 F (37.3 C) 98.4 F (36.9 C)  TempSrc: Oral  Oral Oral  SpO2: 98% 97% 97% 99%  Weight:      Height:         Intake/Output Summary (Last 24 hours) at 07/09/2022 1402 Last data filed at 07/09/2022 0915 Gross per 24 hour  Intake 315 ml  Output 2900 ml  Net -2585 ml    Filed Weights   07/06/22 1505 07/06/22 2356  Weight: 108 kg 106.8 kg     Exam General exam: Appears calm and comfortable  Respiratory system: Clear to auscultation. Respiratory effort normal. Cardiovascular system: S1 & S2 heard, RRR. No JVD Gastrointestinal system: Abdomen is nondistended, soft and nontender.  Central nervous system: Alert and oriented. No focal neurological deficits. Extremities: Symmetric 5 x 5 power. Skin: No rashes, Psychiatry:  Mood & affect appropriate.      Data Reviewed:  I have personally reviewed following labs and imaging studies   CBC Lab Results  Component Value Date   WBC 3.8 (L) 07/08/2022   RBC 2.91 (L) 07/08/2022   HGB 8.9 (L) 07/08/2022   HCT 27.6 (L) 07/08/2022   MCV 94.8 07/08/2022   MCH 30.6 07/08/2022   PLT 197 07/08/2022   MCHC 32.2 07/08/2022   RDW 13.6 07/08/2022   LYMPHSABS 1.1 07/08/2022   MONOABS 0.5 07/08/2022   EOSABS 0.1 07/08/2022   BASOSABS 0.0 [id number]     Last metabolic panel Lab Results  Component Value Date   NA 134 (L) 07/09/2022   K 3.2 (L) 07/09/2022   CL 100 07/09/2022   CO2 21 (L) 07/09/2022   BUN 21 (H) 07/09/2022   CREATININE 1.23 (H) 07/09/2022   GLUCOSE 99 07/09/2022   GFRNONAA 51 (L) 07/09/2022   GFRAA 119 06/27/2020   CALCIUM 8.7 (L) 07/09/2022   PHOS 9.0 (H) 06/02/2022   PROT 7.2 07/06/2022   ALBUMIN 3.5 07/06/2022   LABGLOB 3.2 06/01/2022   AGRATIO 0.8 06/01/2022   BILITOT 0.2 (L) 07/06/2022   ALKPHOS 98 07/06/2022   AST 16 07/06/2022   ALT 12 07/06/2022   ANIONGAP 13 07/09/2022    CBG (last 3)  No results for input(s): "GLUCAP" in the last 72 hours.    Coagulation Profile: No results for input(s): "INR", "PROTIME" in the last 168 hours.   Radiology Studies: DG Chest 2 View  Result Date:  07/09/2022 CLINICAL DATA:  Cough for several days EXAM: CHEST - 2 VIEW COMPARISON:  X-ray 07/06/2022 FINDINGS: Underinflation. No consolidation, pneumothorax or effusion. No edema. Normal cardiopericardial silhouette. Fixation hardware scattered  diffusely along the thoracic spine, lower cervical spine and there is evidence of arthroplasty involving the right shoulder. IMPRESSION: Underinflation.  No acute osseous abnormality. Extensive orthopedic hardware Electronically Signed   By: Jill Side M.D.   On: 07/09/2022 12:11       Hosie Poisson M.D. [facility] 07/09/2022, 2:02 PM  Available via Epic secure chat 7am-7pm After 7 pm, please refer to night coverage provider listed on amion.

## 2022-07-10 DIAGNOSIS — I5032 Chronic diastolic (congestive) heart failure: Secondary | ICD-10-CM | POA: Diagnosis not present

## 2022-07-10 DIAGNOSIS — E871 Hypo-osmolality and hyponatremia: Secondary | ICD-10-CM | POA: Diagnosis not present

## 2022-07-10 DIAGNOSIS — G9340 Encephalopathy, unspecified: Secondary | ICD-10-CM | POA: Diagnosis not present

## 2022-07-10 DIAGNOSIS — I10 Essential (primary) hypertension: Secondary | ICD-10-CM | POA: Diagnosis not present

## 2022-07-10 LAB — CBC WITH DIFFERENTIAL/PLATELET
Abs Immature Granulocytes: 0.01 10*3/uL (ref 0.00–0.07)
Basophils Absolute: 0 10*3/uL (ref 0.0–0.1)
Basophils Relative: 1 %
Eosinophils Absolute: 0.2 10*3/uL (ref 0.0–0.5)
Eosinophils Relative: 6 %
HCT: 27.8 % — ABNORMAL LOW (ref 36.0–46.0)
Hemoglobin: 9 g/dL — ABNORMAL LOW (ref 12.0–15.0)
Immature Granulocytes: 0 %
Lymphocytes Relative: 39 %
Lymphs Abs: 1.5 10*3/uL (ref 0.7–4.0)
MCH: 31 pg (ref 26.0–34.0)
MCHC: 32.4 g/dL (ref 30.0–36.0)
MCV: 95.9 fL (ref 80.0–100.0)
Monocytes Absolute: 0.4 10*3/uL (ref 0.1–1.0)
Monocytes Relative: 11 %
Neutro Abs: 1.7 10*3/uL (ref 1.7–7.7)
Neutrophils Relative %: 43 %
Platelets: 211 10*3/uL (ref 150–400)
RBC: 2.9 MIL/uL — ABNORMAL LOW (ref 3.87–5.11)
RDW: 13.6 % (ref 11.5–15.5)
WBC: 4 10*3/uL (ref 4.0–10.5)
nRBC: 0 % (ref 0.0–0.2)

## 2022-07-10 LAB — BASIC METABOLIC PANEL
Anion gap: 5 (ref 5–15)
BUN: 24 mg/dL — ABNORMAL HIGH (ref 6–20)
CO2: 23 mmol/L (ref 22–32)
Calcium: 8.7 mg/dL — ABNORMAL LOW (ref 8.9–10.3)
Chloride: 103 mmol/L (ref 98–111)
Creatinine, Ser: 1.5 mg/dL — ABNORMAL HIGH (ref 0.44–1.00)
GFR, Estimated: 40 mL/min — ABNORMAL LOW (ref >60)
Glucose, Bld: 141 mg/dL — ABNORMAL HIGH (ref 70–99)
Potassium: 4.2 mmol/L (ref 3.5–5.1)
Sodium: 131 mmol/L — ABNORMAL LOW (ref 135–145)

## 2022-07-10 MED ORDER — ORAL CARE MOUTH RINSE: 15.0000 mL | OROMUCOSAL | Status: DC | PRN

## 2022-07-10 MED ORDER — ADULT MULTIVITAMIN W/MINERALS CH
1.0000 | ORAL_TABLET | Freq: Every day | ORAL | Status: DC
  Administered 2022-07-10 – 2022-07-12 (×3): 1 via ORAL
  Filled 2022-07-10 (×3): qty 1

## 2022-07-10 MED ORDER — BISACODYL 10 MG RE SUPP
10.0000 mg | Freq: Once | RECTAL | Status: AC
  Administered 2022-07-10: 10 mg via RECTAL
  Filled 2022-07-10: qty 1

## 2022-07-10 MED ORDER — POLYETHYLENE GLYCOL 3350 17 G PO PACK
17.0000 g | PACK | Freq: Every day | ORAL | Status: DC
  Administered 2022-07-10 – 2022-07-11 (×2): 17 g via ORAL
  Filled 2022-07-10 (×3): qty 1

## 2022-07-10 MED ORDER — ENSURE ENLIVE PO LIQD
237.0000 mL | Freq: Two times a day (BID) | ORAL | Status: DC
  Administered 2022-07-10 – 2022-07-12 (×4): 237 mL via ORAL

## 2022-07-10 NOTE — Progress Notes (Signed)
Pt refuses CPAP 

## 2022-07-10 NOTE — Progress Notes (Signed)
Initial Nutrition Assessment  DOCUMENTATION CODES:   Obesity unspecified  INTERVENTION:  Ensure Enlive po BID, each supplement provides 350 kcal and 20 grams of protein. MVI with minerals daily Recommend scheduled bowel regimen given no documented bowel movement x 4 days  NUTRITION DIAGNOSIS:   Increased nutrient needs related to acute illness as evidenced by estimated needs.  GOAL:   Patient will meet greater than or equal to 90% of their needs  MONITOR:   PO intake, Labs, Weight trends, Supplement acceptance, I & O's  REASON FOR ASSESSMENT:   Consult Assessment of nutrition requirement/status  ASSESSMENT:   Pt admitted d/t worsening lethargy and somnolence for weeks. PMH significant for HTN, bipolar disorder, sleep apnea, CKD stage II, chronic diastolic CHF.   MD suspects acute encephalopathy r/t polypharmacy in the setting of mild dehydration.   Pt assessed by RD team during prior admission. Pt had been eating poorly during that time d/t n/v/d and was ordered nutrition supplements.   Meal completions: 3/20: 75% lunch 3/21: 30% breakfast 3/22: 25% breakfast 3/23: 100% breakfast  During prior admission, pt reported UBW to be 250 lbs.  Reviewed weight history. At office visit, her weight on 3/1 was noted to be 121.1 kg. Current weight documented at 106.8 kg. Suspect some of this weight loss to be true dry weight loss however given pt on lasix, it is unclear how much is fluid losses versus dry weight. Will continue to monitor throughout admission.   Edema: mild pitting generalized, non-pitting BLE  Medications: pepcid, lasix, protonix  Labs: sodium 131, BUN 24, Cr 1.50, GFR 40  NUTRITION - FOCUSED PHYSICAL EXAM: RD working remotely. Deferred to follow up. No depletions noted upon RD assessment during prior admission physical exam (02/23).   Diet Order:   Diet Order             Diet regular Room service appropriate? Yes; Fluid consistency: Thin  Diet effective  now                   EDUCATION NEEDS:   No education needs have been identified at this time  Skin:  Skin Assessment: Reviewed RN Assessment  Last BM:  3/19  Height:   Ht Readings from Last 1 Encounters:  07/06/22 5\' 4"  (1.626 m)    Weight:   Wt Readings from Last 1 Encounters:  07/06/22 106.8 kg    Ideal Body Weight:  54.5 kg  BMI:  Body mass index is 40.42 kg/m.  Estimated Nutritional Needs:   Kcal:  1600-1800  Protein:  80-95g  Fluid:  >/=1.6L  Clayborne Dana, RDN, LDN Clinical Nutrition

## 2022-07-10 NOTE — Progress Notes (Signed)
Refused CPAP

## 2022-07-10 NOTE — Progress Notes (Signed)
[facility]                                                                               [name] [name], is a 57 y.o. female, DOB - [birth year], [id number] Admit date - 07/06/2022    Outpatient Primary MD for the patient is [name] Frizzle, MD  LOS - 3  days    Brief summary    [name] [name] is a 57 y.o. female with medical history significant for asthma, hypertension, bipolar disorder, sleep apnea, CKD stage II, chronic diastolic CHF, and chronic pain who presents to the emergency department with somnolence.   Patient has been noted to be lethargic and somnolent for weeks, was evaluated by her PCP for this a few days ago, and has been worsening. Home health nurse was concerned by the patient's somnolence today and recommended transport to ED.  CT is negative for acute intracranial abnormality, and chest x-ray is negative for acute cardiopulmonary disease.   Assessment & Plan    Assessment and Plan:  Acute encephalopathy possibly from polypharmacy in the setting of mild dehydration Slightly elevated TSH , free T4 wnl. ammonia UDS and ETOH negative. VBG unremarkable.  CT head negative. Therapy evaluations ordered.  No source of infection.  Patient is more alert and answering all questions appropriately,. She appears to be back to baseline.  Adjusted medications.  Therapy evaluations ordered and recommended HOME HEALTH PT.     Hyponatremia: Improved to 131.    Anemia of chronic disease/ iron deficiency anemia: Iron supplementations will be added.  Hemoglobin is stable around 9.   Asthma:  Occasional dry cough.  Prn albuterol.    Hypertension:  Well controlled.  No changes in meds.    Chronic diastolic CHF; She appears euvolemic.  Resume lasix.   Peripheral neuropathy:  On lyrica.     Urinary retention:  Unclear etiology. S/p foley catheter placement.  In and out done  twice and UA is negative for infection.  We have discontinued  her Vesicare.     Chronic pain syndrome:  - restart home meds slowly.  Decreased the dose of oxy/ percocet to 5 mg -325 mg every 8 hours prn.    Sleep apnea:  CPAP at night.   Fever:  Blood cultures ordered. Negative so far.    Constipation;  No BM yet, added dulcolax suppository.    Bipolar disorder:  Currently on Lamictal, Cymbalta and Buspar.   Sore throat, congestion and cough Claritin and Group A strep PCR ordered.    Estimated body mass index is 40.42 kg/m as calculated from the following:   Height as of this encounter: 5\' 4"  (1.626 m).   Weight as of this encounter: 106.8 kg.  Code Status: full code DVT Prophylaxis:  lovenox    Level of Care: Level of care: Med-Surg Family Communication: none at bedside.   Disposition Plan:     Remains inpatient appropriate:  urinary retention. , possible d.c home in am after voiding trial.   Procedures:  None.   Consultants:   None.   Antimicrobials:   Anti-infectives (From admission, onward)    None  Medications  Scheduled Meds:  bisacodyl  10 mg Rectal Once   busPIRone  15 mg Oral TID   Chlorhexidine Gluconate Cloth  6 each Topical Daily   DULoxetine  20 mg Oral Daily   enoxaparin (LOVENOX) injection  50 mg Subcutaneous Q24H   escitalopram  10 mg Oral Daily   famotidine  40 mg Oral QHS   feeding supplement  237 mL Oral BID BM   lamoTRIgine  100 mg Oral Daily   loratadine  10 mg Oral Daily   multivitamin with minerals  1 tablet Oral Daily   pantoprazole  40 mg Oral BID   polyethylene glycol  17 g Oral Daily   pregabalin  100 mg Oral TID   topiramate  50 mg Oral BID   traZODone  100 mg Oral QHS   Continuous Infusions: PRN Meds:.albuterol, benzonatate, guaiFENesin-dextromethorphan, haloperidol, ondansetron **OR** ondansetron (ZOFRAN) IV, mouth rinse, oxyCODONE-acetaminophen, phenol, polyvinyl alcohol, senna-docusate    Subjective:   [name] [name] was seen and examined today.   Constipated. Reports feeling better.   Vitals:   07/10/22 0606 07/10/22 0738 07/10/22 1159 07/10/22 1642  BP: 110/63 107/69 112/66 133/68  Pulse: 64 62 65 70  Resp: 18 16 18 18   Temp: 98.3 F (36.8 C) 97.6 F (36.4 C) 97.7 F (36.5 C) 97.8 F (36.6 C)  TempSrc: Oral Oral Oral Oral  SpO2: 96% 97% 100% 100%  Weight:      Height:        Intake/Output Summary (Last 24 hours) at 07/10/2022 1718 Last data filed at 07/10/2022 0948 Gross per 24 hour  Intake 960 ml  Output 2500 ml  Net -1540 ml    Filed Weights   07/06/22 1505 07/06/22 2356  Weight: 108 kg 106.8 kg     Exam General exam: Appears calm and comfortable  Respiratory system: Clear to auscultation. Respiratory effort normal. Cardiovascular system: S1 & S2 heard, RRR. No JVD,  Gastrointestinal system: Abdomen is nondistended, soft and nontender.  Central nervous system: Alert and oriented. No focal neurological deficits. Extremities: Symmetric 5 x 5 power. Skin: No rashes,  Psychiatry: Mood & affect appropriate.       Data Reviewed:  I have personally reviewed following labs and imaging studies   CBC Lab Results  Component Value Date   WBC 4.0 07/10/2022   RBC 2.90 (L) 07/10/2022   HGB 9.0 (L) 07/10/2022   HCT 27.8 (L) 07/10/2022   MCV 95.9 07/10/2022   MCH 31.0 07/10/2022   PLT 211 07/10/2022   MCHC 32.4 07/10/2022   RDW 13.6 07/10/2022   LYMPHSABS 1.5 07/10/2022   MONOABS 0.4 07/10/2022   EOSABS 0.2 07/10/2022   BASOSABS 0.0 99991111     Last metabolic panel Lab Results  Component Value Date   NA 131 (L) 07/10/2022   K 4.2 07/10/2022   CL 103 07/10/2022   CO2 23 07/10/2022   BUN 24 (H) 07/10/2022   CREATININE 1.50 (H) 07/10/2022   GLUCOSE 141 (H) 07/10/2022   GFRNONAA 40 (L) 07/10/2022   GFRAA 119 06/27/2020   CALCIUM 8.7 (L) 07/10/2022   PHOS 9.0 (H) 06/02/2022   PROT 7.2 07/06/2022   ALBUMIN 3.5 07/06/2022   LABGLOB 3.2 06/01/2022   AGRATIO 0.8 06/01/2022   BILITOT 0.2 (L)  07/06/2022   ALKPHOS 98 07/06/2022   AST 16 07/06/2022   ALT 12 07/06/2022   ANIONGAP 5 07/10/2022    CBG (last 3)  No results for input(s): "GLUCAP" in the last 72  hours.    Coagulation Profile: No results for input(s): "INR", "PROTIME" in the last 168 hours.   Radiology Studies: US RENAL  Result Date: 07/09/2022 CLINICAL DATA:  Renal dysfunction EXAM: RENAL / URINARY TRACT ULTRASOUND COMPLETE COMPARISON:  06/02/2022 FINDINGS: Right Kidney: Renal measurements: 11.8 x 5.3 x 5.5 cm = volume: 181.1 mL. Echogenicity within normal limits. No mass or hydronephrosis visualized. Left Kidney: Renal measurements: 12.7 x 6.4 x 5.7 cm = volume: 239.9 mL. Echogenicity within normal limits. No mass or hydronephrosis visualized. Bladder: Appears normal for degree of bladder distention. Other: None. IMPRESSION: There is no hydronephrosis. Electronically Signed   By: Elmer Picker M.D.   On: 07/09/2022 14:04   DG Chest 2 View  Result Date: 07/09/2022 CLINICAL DATA:  Cough for several days EXAM: CHEST - 2 VIEW COMPARISON:  X-ray 07/06/2022 FINDINGS: Underinflation. No consolidation, pneumothorax or effusion. No edema. Normal cardiopericardial silhouette. Fixation hardware scattered diffusely along the thoracic spine, lower cervical spine and there is evidence of arthroplasty involving the right shoulder. IMPRESSION: Underinflation.  No acute osseous abnormality. Extensive orthopedic hardware Electronically Signed   By: Jill Side M.D.   On: 07/09/2022 12:11       Hosie Poisson M.D. [facility] 07/10/2022, 5:18 PM  Available via Epic secure chat 7am-7pm After 7 pm, please refer to night coverage provider listed on amion.

## 2022-07-11 DIAGNOSIS — I10 Essential (primary) hypertension: Secondary | ICD-10-CM | POA: Diagnosis not present

## 2022-07-11 DIAGNOSIS — I5032 Chronic diastolic (congestive) heart failure: Secondary | ICD-10-CM | POA: Diagnosis not present

## 2022-07-11 DIAGNOSIS — G9340 Encephalopathy, unspecified: Secondary | ICD-10-CM | POA: Diagnosis not present

## 2022-07-11 DIAGNOSIS — E871 Hypo-osmolality and hyponatremia: Secondary | ICD-10-CM | POA: Diagnosis not present

## 2022-07-11 LAB — BASIC METABOLIC PANEL
Anion gap: 9 (ref 5–15)
BUN: 32 mg/dL — ABNORMAL HIGH (ref 6–20)
CO2: 25 mmol/L (ref 22–32)
Calcium: 9.3 mg/dL (ref 8.9–10.3)
Chloride: 102 mmol/L (ref 98–111)
Creatinine, Ser: 1.27 mg/dL — ABNORMAL HIGH (ref 0.44–1.00)
GFR, Estimated: 49 mL/min — ABNORMAL LOW (ref >60)
Glucose, Bld: 120 mg/dL — ABNORMAL HIGH (ref 70–99)
Potassium: 5 mmol/L (ref 3.5–5.1)
Sodium: 136 mmol/L (ref 135–145)

## 2022-07-11 MED ORDER — OXYCODONE-ACETAMINOPHEN 5-325 MG PO TABS
1.0000 | ORAL_TABLET | Freq: Three times a day (TID) | ORAL | Status: DC | PRN
  Administered 2022-07-11 – 2022-07-12 (×2): 1 via ORAL
  Filled 2022-07-11 (×2): qty 1

## 2022-07-11 NOTE — Progress Notes (Signed)
[facility]                                                                               [name] [name], is a 57 y.o. female, DOB - 03/26/1966, [id number] Admit date - 07/06/2022    Outpatient Primary MD for the patient is [name] Frizzle, MD  LOS - 4  days    Brief summary    [name] [name] is a 57 y.o. female with medical history significant for asthma, hypertension, bipolar disorder, sleep apnea, CKD stage II, chronic diastolic CHF, and chronic pain who presents to the emergency department with somnolence.   Patient has been noted to be lethargic and somnolent for weeks, was evaluated by her PCP for this a few days ago, and has been worsening. Home health nurse was concerned by the patient's somnolence today and recommended transport to ED.  CT is negative for acute intracranial abnormality, and chest x-ray is negative for acute cardiopulmonary disease.   Assessment & Plan    Assessment and Plan:  Acute encephalopathy possibly from polypharmacy in the setting of mild dehydration Slightly elevated TSH , free T4 wnl. ammonia UDS and ETOH negative. VBG unremarkable.  CT head negative. Therapy evaluations ordered.  No source of infection.  Patient is more alert and answering all questions appropriately,. She appears to be back to baseline.  Adjusted medications.  Therapy evaluations ordered and recommended HOME HEALTH PT.     Hyponatremia: Resolved.    Anemia of chronic disease/ iron deficiency anemia: Iron supplementations will be added.  Hemoglobin is stable around 9.   Asthma:  Occasional dry cough.  Prn albuterol.    Hypertension:  Well controlled.  No changes in meds.    Chronic diastolic CHF; She appears euvolemic.  Restart lasix in am if her creatinine continues to improve.   Peripheral neuropathy:  On lyrica.     Urinary retention:  Unclear etiology. S/p foley catheter placement.  In and out done  twice and UA is negative  for infection.  We have discontinued her Vesicare.  Recommend voiding trial and if able to urinate, should be good for discharge today.   AKI;  Probably secondary to urinary retention.  US renal is negative for hydronephrosis.     Chronic pain syndrome:  - restart home meds slowly.  Decreased the dose of oxy/ percocet to 5 mg -325 mg every 8 hours prn.    Sleep apnea:  CPAP at night.   Fever:  Blood cultures ordered. Negative so far.    Constipation;  No BM yet, added dulcolax suppository.    Bipolar disorder:  Currently on Lamictal, Cymbalta and Buspar.   Sore throat, congestion and cough Claritin and Group A strep PCR ordered.    Estimated body mass index is 38.98 kg/m as calculated from the following:   Height as of this encounter: 5\' 4"  (1.626 m).   Weight as of this encounter: 103 kg.  Code Status: full code DVT Prophylaxis:  lovenox    Level of Care: Level of care: Med-Surg Family Communication: none at bedside.   Disposition Plan:     Remains inpatient appropriate:  urinary retention. , possible  d.c home in am after voiding trial.   Procedures:  None.   Consultants:   None.   Antimicrobials:   Anti-infectives (From admission, onward)    None        Medications  Scheduled Meds:  busPIRone  15 mg Oral TID   Chlorhexidine Gluconate Cloth  6 each Topical Daily   DULoxetine  20 mg Oral Daily   enoxaparin (LOVENOX) injection  50 mg Subcutaneous Q24H   escitalopram  10 mg Oral Daily   famotidine  40 mg Oral QHS   feeding supplement  237 mL Oral BID BM   lamoTRIgine  100 mg Oral Daily   loratadine  10 mg Oral Daily   multivitamin with minerals  1 tablet Oral Daily   pantoprazole  40 mg Oral BID   polyethylene glycol  17 g Oral Daily   pregabalin  100 mg Oral TID   topiramate  50 mg Oral BID   traZODone  100 mg Oral QHS   Continuous Infusions: PRN Meds:.albuterol, benzonatate, guaiFENesin-dextromethorphan, haloperidol, ondansetron  **OR** ondansetron (ZOFRAN) IV, mouth rinse, oxyCODONE-acetaminophen, phenol, polyvinyl alcohol, senna-docusate    Subjective:   Mozetta Wadding was seen and examined today.  BM last night.   Vitals:   07/11/22 0413 07/11/22 0500 07/11/22 0737 07/11/22 1602  BP: 110/71  122/79 129/77  Pulse: 63  67 72  Resp: 18  16 18   Temp: 97.8 F (36.6 C)  97.6 F (36.4 C) (!) 97.5 F (36.4 C)  TempSrc:   Oral Oral  SpO2: 99%  100% 99%  Weight:  103 kg    Height:        Intake/Output Summary (Last 24 hours) at 07/11/2022 1608 Last data filed at 07/11/2022 1443 Gross per 24 hour  Intake 950 ml  Output 2600 ml  Net -1650 ml    Filed Weights   07/06/22 1505 07/06/22 2356 07/11/22 0500  Weight: 108 kg 106.8 kg 103 kg     Exam General exam: Appears calm and comfortable  Respiratory system: Clear to auscultation. Respiratory effort normal. Cardiovascular system: S1 & S2 heard, RRR. No JVD,  Gastrointestinal system: Abdomen is nondistended, soft and nontender.  Central nervous system: Alert and oriented. No focal neurological deficits. Extremities: Symmetric 5 x 5 power. Skin: No rashes,  Psychiatry:  Mood & affect appropriate.        Data Reviewed:  I have personally reviewed following labs and imaging studies   CBC Lab Results  Component Value Date   WBC 4.0 07/10/2022   RBC 2.90 (L) 07/10/2022   HGB 9.0 (L) 07/10/2022   HCT 27.8 (L) 07/10/2022   MCV 95.9 07/10/2022   MCH 31.0 07/10/2022   PLT 211 07/10/2022   MCHC 32.4 07/10/2022   RDW 13.6 07/10/2022   LYMPHSABS 1.5 07/10/2022   MONOABS 0.4 07/10/2022   EOSABS 0.2 07/10/2022   BASOSABS 0.0 99991111     Last metabolic panel Lab Results  Component Value Date   NA 136 07/11/2022   K 5.0 07/11/2022   CL 102 07/11/2022   CO2 25 07/11/2022   BUN 32 (H) 07/11/2022   CREATININE 1.27 (H) 07/11/2022   GLUCOSE 120 (H) 07/11/2022   GFRNONAA 49 (L) 07/11/2022   GFRAA 119 06/27/2020   CALCIUM 9.3 07/11/2022    PHOS 9.0 (H) 06/02/2022   PROT 7.2 07/06/2022   ALBUMIN 3.5 07/06/2022   LABGLOB 3.2 06/01/2022   AGRATIO 0.8 06/01/2022   BILITOT 0.2 (L) 07/06/2022   ALKPHOS 98  07/06/2022   AST 16 07/06/2022   ALT 12 07/06/2022   ANIONGAP 9 07/11/2022    CBG (last 3)  No results for input(s): "GLUCAP" in the last 72 hours.    Coagulation Profile: No results for input(s): "INR", "PROTIME" in the last 168 hours.   Radiology Studies: No results found.     Hosie Poisson M.D. [facility] 07/11/2022, 4:08 PM  Available via Epic secure chat 7am-7pm After 7 pm, please refer to night coverage provider listed on amion.

## 2022-07-12 DIAGNOSIS — F319 Bipolar disorder, unspecified: Secondary | ICD-10-CM | POA: Diagnosis not present

## 2022-07-12 DIAGNOSIS — I10 Essential (primary) hypertension: Secondary | ICD-10-CM | POA: Diagnosis not present

## 2022-07-12 DIAGNOSIS — G9340 Encephalopathy, unspecified: Secondary | ICD-10-CM | POA: Diagnosis not present

## 2022-07-12 DIAGNOSIS — N182 Chronic kidney disease, stage 2 (mild): Secondary | ICD-10-CM | POA: Diagnosis not present

## 2022-07-12 LAB — CULTURE, BLOOD (ROUTINE X 2)
Culture: NO GROWTH
Culture: NO GROWTH
Special Requests: ADEQUATE
Special Requests: ADEQUATE

## 2022-07-12 MED ORDER — POLYETHYLENE GLYCOL 3350 17 G PO PACK: 17.0000 g | PACK | Freq: Every day | ORAL | 0 refills | Status: DC

## 2022-07-12 MED ORDER — TRAZODONE HCL 100 MG PO TABS: 100.0000 mg | ORAL_TABLET | Freq: Every day | ORAL | 0 refills | Status: AC

## 2022-07-12 MED ORDER — GUAIFENESIN-DM 100-10 MG/5ML PO SYRP: 5.0000 mL | ORAL_SOLUTION | ORAL | 0 refills | Status: DC | PRN

## 2022-07-12 MED ORDER — OXYCODONE-ACETAMINOPHEN 5-325 MG PO TABS: 1.0000 | ORAL_TABLET | Freq: Three times a day (TID) | ORAL | 0 refills | Status: AC | PRN

## 2022-07-12 MED ORDER — BENZONATATE 200 MG PO CAPS: 200.0000 mg | ORAL_CAPSULE | Freq: Three times a day (TID) | ORAL | 0 refills | Status: DC | PRN

## 2022-07-12 MED ORDER — HALOPERIDOL 2 MG PO TABS: 2.0000 mg | ORAL_TABLET | Freq: Every evening | ORAL | 0 refills | Status: DC | PRN

## 2022-07-12 MED ORDER — ENSURE ENLIVE PO LIQD: 237.0000 mL | Freq: Two times a day (BID) | ORAL | 2 refills | Status: AC

## 2022-07-12 NOTE — TOC Progression Note (Signed)
Transition of Care [facility]) - Progression Note    Patient Details  Name: [name] [name] MRN: [id number] Date of Birth: [birth year]  Transition of Care [facility]) CM/SW [place], RN Phone Number: 07/12/2022, 11:58 AM  Clinical Narrative:    CM met with the patient at the bedside to update the patient regarding outpatient services for [facility] home health.  Active orders for home health services were renewed.  The patient states that she has voided after the foley was discontinued and plans to return home when she is discharged.  The patient states that she plans to have a friend transport her to home by car when she is discharged - once medically stable.   Expected Discharge Plan: [place] Barriers to Discharge: Continued Medical Work up  Expected Discharge Plan and Services   Discharge Planning Services: CM Consult Post Acute Care Choice: Resumption of Svcs/PTA Provider, Home Health Living arrangements for the past 2 months: [place]: RN, PT, OT       Representative spoke with at [place]: Tommi Rumps, [facility] with Alvis Lemmings is aware that patient is readmitted to the hospital - active for PT/OT/ RN   Social Determinants of Health (SDOH) Interventions SDOH Screenings   Food Insecurity: No Food Insecurity (06/24/2022)  Housing: Low Risk  (06/24/2022)  Transportation Needs: No Transportation Needs (06/24/2022)  Utilities: Not At Risk (06/24/2022)  Alcohol Screen: Low Risk  (08/21/2020)  Depression (PHQ2-9): Low Risk  (06/21/2022)  Financial Resource Strain: Low Risk  (06/18/2022)  Physical Activity: Inactive (08/21/2020)  Social Connections: Socially Isolated (08/21/2020)  Stress: No Stress Concern Present (08/21/2020)  Tobacco Use: Low Risk  (07/06/2022)    Readmission Risk Interventions    06/25/2022    1:44 PM  Readmission Risk Prevention Plan  Transportation Screening Complete  PCP or Specialist Appt within  3-5 Days Complete  HRI or [place] Complete  Palliative Care Screening Not Applicable  Medication Review (RN Care Manager) Complete

## 2022-07-12 NOTE — Progress Notes (Signed)
Mobility Specialist - Progress Note   07/12/22 1007  Mobility  Activity Ambulated with assistance to bathroom  Level of Assistance Contact guard assist, steadying assist  Assistive Device Front wheel walker  Distance Ambulated (ft) 10 ft  Activity Response Tolerated well  Mobility Referral Yes  $Mobility charge 1 Mobility   Pt was received in bed requesting assistance to BR. No complaints throughout ambulation. Pt was left on commode with all needs met and instructed to use call bell when finished.   [name]  Mobility Specialist Please contact via Solic[place] or Rehab office at [phone]

## 2022-07-12 NOTE — Consult Note (Signed)
   [facility] CM Inpatient Consult   07/12/2022  [name] [name] [birth year] [id number]  [place] [facility] [ACO] Patient: [employer] Medicare  Primary Care Provider:  [name], MD with [facility]   Patient screened for readmission hospitalization with noted extreme high risk score for unplanned readmission risk to assess for potential [place] Management service needs for post hospital transition for care coordination.  Review of patient's electronic medical record reveals patient is transitioning home.  1456:  Came to see patient regarding post hospital needs. Patient transitioned home with home health already. Plan:  Referral request for community care coordination:  Of note, [place] does not replace or interfere with any arrangements made by the Inpatient Transition of Care team.  For questions contact:   Natividad Brood, RN BSN [place]  [phone] business mobile phone Toll free office [phone]  *[place]  [phone] Fax number: [phone] [email or web address]

## 2022-07-12 NOTE — Progress Notes (Signed)
Physical Therapy Treatment Patient Details Name: SISILIA BROOKES MRN: [id number] DOB: Mar 06, 1966 Today's Date: 07/12/2022   History of Present Illness 57 yo female presents to [facility] on 3/19 with lethargy, bilat shoulder and R hip pain. Workup for acute encephalopathy, possibly due to polypharmacy. Recent admission 3/7-3/10 for HF. 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    PT Comments    Patient progressing well towards PT goals. Session focused on progressive ambulation and functional mobility. Improved ambulation distance with Min guard assist and use of RW for support. Requires total A to donn socks/shoes/CAM boot prior to ambulation. Heavily relies on rails and HOB elevated for bed mobility and reports difficulty getting OOB at home in her regular bed. Requesting hospital bed for home to improve independence/mobility as well as for skin integrity as pt with hx of TMA. Encouraged increasing activity while in the hospital. Will follow acutely.   Recommendations for follow up therapy are one component of a multi-disciplinary discharge planning process, led by the attending physician.  Recommendations may be updated based on patient status, additional functional criteria and insurance authorization.  Follow Up Recommendations  Can patient physically be transported by private vehicle: No    Assistance Recommended at Discharge Set up Supervision/Assistance  Patient can return home with the following A little help with walking and/or transfers;A little help with bathing/dressing/bathroom;Assistance with cooking/housework;Assist for transportation;Help with stairs or ramp for entrance   Equipment Recommendations  Hospital bed    Recommendations for Other Services       Precautions / Restrictions Precautions Precautions: Fall Required Braces or Orthoses: Other Brace Other Brace: CAM walker boot and show in  closet Restrictions Weight Bearing Restrictions: No     Mobility  Bed Mobility Overal bed mobility: Needs Assistance Bed Mobility: Supine to Sit, Sit to Supine     Supine to sit: Supervision, HOB elevated Sit to supine: Supervision, HOB elevated   General bed mobility comments: No assist needed, use of rails.    Transfers Overall transfer level: Needs assistance Equipment used: Rolling walker (2 wheels) Transfers: Sit to/from Stand Sit to Stand: Supervision, From elevated surface           General transfer comment: Supervision for safety. Stood from [employer].    Ambulation/Gait Ambulation/Gait assistance: Min guard Gait Distance (Feet): 150 Feet Assistive device: Rolling walker (2 wheels) Gait Pattern/deviations: Step-through pattern, Decreased stride length, Trunk flexed, Antalgic Gait velocity: decr Gait velocity interpretation: <1.31 ft/sec, indicative of household ambulator   General Gait Details: Slow, antalgic like gait with use of RW, worsening pain through right shoulder with increased distance.   Stairs             Wheelchair Mobility    Modified Rankin (Stroke Patients Only)       Balance Overall balance assessment: Needs assistance Sitting-balance support: Feet supported, No upper extremity supported Sitting balance-Leahy Scale: Good Sitting balance - Comments: Total A to donn socks/shoes/CAM boot   Standing balance support: During functional activity, Reliant on assistive device for balance Standing balance-Leahy Scale: Poor Standing balance comment: reliant on external support                            Cognition Arousal/Alertness: Awake/alert Behavior During Therapy: WFL for tasks assessed/performed Overall Cognitive Status: Within Functional Limits for tasks assessed  General Comments: WFL for basic tasks assessed        Exercises      General Comments General comments  (skin integrity, edema, etc.): VSS on RA.      Pertinent Vitals/Pain Pain Assessment Pain Assessment: Faces Faces Pain Scale: Hurts even more Pain Location: right shoulder, hip Pain Descriptors / Indicators: Guarding, Grimacing, Sore, Discomfort Pain Intervention(s): Monitored during session, Repositioned, Premedicated before session, Limited activity within patient's tolerance    Home Living                          Prior Function            PT Goals (current goals can now be found in the care plan section) Progress towards PT goals: Progressing toward goals    Frequency    Min 3X/week      PT Plan Equipment recommendations need to be updated    Co-evaluation              AM-PAC PT "6 Clicks" Mobility   Outcome Measure  Help needed turning from your back to your side while in a flat bed without using bedrails?: A Little Help needed moving from lying on your back to sitting on the side of a flat bed without using bedrails?: A Little Help needed moving to and from a bed to a chair (including a wheelchair)?: A Little Help needed standing up from a chair using your arms (e.g., wheelchair or bedside chair)?: A Little Help needed to walk in hospital room?: A Little Help needed climbing 3-5 steps with a railing? : A Little 6 Click Score: 18    End of Session Equipment Utilized During Treatment: Gait belt Activity Tolerance: Patient tolerated treatment well;Patient limited by pain Patient left: in bed;with call bell/phone within reach Nurse Communication: Mobility status PT Visit Diagnosis: Muscle weakness (generalized) (M62.81);Difficulty in walking, not elsewhere classified (R26.2)     Time: [id number] PT Time Calculation (min) (ACUTE ONLY): 19 min  Charges:  $Gait Training: 8-22 mins                     Marisa Severin, PT, DPT Acute Rehabilitation Services Secure chat preferred Office [place] 07/12/2022, 12:26  PM

## 2022-07-12 NOTE — Discharge Summary (Addendum)
Physician Discharge Summary   Patient: [name] [name] MRN: [id number] DOB: 1966/01/09  Admit date:     07/06/2022  Discharge date: 07/12/22  Discharge Physician: Hosie Poisson   PCP: [name], MD   Recommendations at discharge:  Please follow up with PCp in one week.   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 female with medical history significant for asthma, hypertension, bipolar disorder, sleep apnea, CKD stage II, chronic diastolic CHF, and chronic pain who presents to the emergency department with somnolence.   Patient has been noted to be lethargic and somnolent for weeks, was evaluated by her PCP for this a few days ago, and has been worsening. Home health nurse was concerned by the patient's somnolence today and recommended transport to ED.  CT is negative for acute intracranial abnormality, and chest x-ray is negative for acute cardiopulmonary disease.   Assessment and Plan:  Acute encephalopathy possibly from polypharmacy in the setting of mild dehydration Slightly elevated TSH , free T4 wnl. ammonia UDS and ETOH negative. VBG unremarkable.  CT head negative. Therapy evaluations ordered.  No source of infection.  Patient is more alert and answering all questions appropriately,. She appears to be back to baseline.  Adjusted medications.  Therapy evaluations ordered and recommended HOME HEALTH PT.        Hyponatremia: Resolved.      Anemia of chronic disease/ iron deficiency anemia: Iron supplementations will be added.  Hemoglobin is stable around 9.    Asthma:  Occasional dry cough.  Prn albuterol.       Hypertension:  Well controlled.  No changes in meds.      Chronic diastolic CHF; She appears euvolemic.  Restart lasix on discharge.     Peripheral neuropathy:  On lyrica.        Urinary retention:  Unclear etiology. S/p foley catheter placement.  In and out done  twice and UA is negative for infection.  We have discontinued her Vesicare.  Recommend voiding trial and if able to urinate, should be good for discharge today.    AKI;  Probably secondary to urinary retention.  US renal is negative for hydronephrosis.        Chronic pain syndrome:  - restart home meds slowly.  Decreased the dose of oxy/ percocet to 5 mg -325 mg every 8 hours prn.      Sleep apnea:  CPAP at night.    Fever:  Blood cultures ordered. Negative so far.      Constipation;  No BM yet, added dulcolax suppository.      Bipolar disorder:  Currently on Lamictal, Cymbalta and Buspar.    Sore throat, congestion and cough Claritin and Group A strep PCR ordered.      Estimated body mass index is 38.98 kg/m as calculated from the following:   Height as of this encounter: 5\' 4"  (1.626 m).   Weight as of this encounter: 103 kg      Consultants: none.  Procedures performed: none.   Disposition: Home Diet recommendation:  Discharge Diet Orders (From admission, onward)     Start  Ordered   07/12/22 0000  Diet - low sodium heart healthy        07/12/22 1253           Regular diet DISCHARGE MEDICATION: Allergies as of 07/12/2022       Reactions   Tetracyclines & Related Other (See Comments)   Syncope and put her "in a coma"   Tramadol Other (See Comments)   Seizures   Oxybutynin Other (See Comments)   Unknown reaction per Mom   Phenazopyridine Other (See Comments)   Unknown reaction   Ciprofloxacin Rash, Itching   Codeine Itching, Rash   Estradiol Rash   Patches broke out the skin        Medication List     STOP taking these medications     oxyCODONE-acetaminophen 10-325 MG tablet Commonly known as: PERCOCET Replaced by: oxyCODONE-acetaminophen 5-325 MG tablet   promethazine 12.5 MG tablet Commonly known as: PHENERGAN       TAKE these medications    Aimovig 70 MG/ML Soaj Generic drug: Erenumab-aooe administer 75mL UNDER THE SKIN every 30 DAYS What changed:  how much to take how to take this when to take this additional instructions   albuterol 108 (90 Base) MCG/ACT inhaler Commonly known as: ProAir HFA Inhale 2 puffs = 153mcg into the lungs every 6 (six) hours as needed for wheezing or shortness of breath. What changed:  how much to take how to take this when to take this reasons to take this additional instructions   benzonatate 200 MG capsule Commonly known as: TESSALON Take 1 capsule (200 mg total) by mouth 3 (three) times daily as needed for cough.   busPIRone 15 MG tablet Commonly known as: BUSPAR Take 15 mg by mouth 3 (three) times daily.   CALCIUM 1200 PO Take 1,200 mg by mouth daily.   DULoxetine 20 MG capsule Commonly known as: CYMBALTA Take 1 capsule (20 mg total) by mouth daily.   escitalopram 10 MG tablet Commonly known as: LEXAPRO Take 10 mg by mouth daily.   estradiol 1 MG tablet Commonly known as: ESTRACE Take 1 tablet (1 mg total) by mouth at bedtime.   famotidine 40 MG tablet Commonly known as: PEPCID TAKE ONE TABLET BY MOUTH EVERYDAY AT BEDTIME What changed: See the new instructions.   feeding supplement Liqd Take 237 mLs by mouth 2 (two) times daily between meals.   furosemide 40 MG tablet Commonly known as: LASIX Take 1 tablet (40 mg total) by mouth daily.   guaiFENesin-dextromethorphan 100-10 MG/5ML syrup Commonly known as: ROBITUSSIN DM Take 5-10 mLs by mouth every 4 (four) hours as needed for cough.   haloperidol 2 MG tablet Commonly known as: HALDOL Take 1 tablet (2 mg total) by mouth at bedtime as needed for agitation (pyschosis). What changed:   medication strength how much to take when to take this reasons to take this additional instructions   Iron (Ferrous Sulfate) 325 (65 Fe) MG Tabs Take 325 mg by mouth in the morning and at bedtime.   lamoTRIgine 100 MG tablet Commonly known as: LAMICTAL Take 100 mg by mouth daily.   levocetirizine 5 MG tablet Commonly known as: XYZAL Take 5 mg by mouth every evening.   losartan 50 MG tablet Commonly known as: COZAAR Take 1 tablet (50 mg total) by mouth daily.   multivitamin with minerals tablet Take 1 tablet by mouth daily.   naloxone 4 MG/0.1ML Liqd nasal spray kit Commonly known as: NARCAN Place 0.4 mg into the  nose once.   oxyCODONE-acetaminophen 5-325 MG tablet Commonly known as: PERCOCET/ROXICET Take 1 tablet by mouth every 8 (eight) hours as needed for up to 3 days for moderate pain or severe pain. Replaces: oxyCODONE-acetaminophen 10-325 MG tablet   pantoprazole 40 MG tablet Commonly known as: PROTONIX Take 1 tablet (40 mg total) by mouth 2 (two) times daily.   polyethylene glycol 17 g packet Commonly known as: MIRALAX / GLYCOLAX Take 17 g by mouth daily. Start taking on: July 13, 2022   pregabalin 100 MG capsule Commonly known as: LYRICA Take 1 capsule (100 mg total) by mouth 2 (two) times daily. What changed: when to take this   Relistor 150 MG Tabs Generic drug: Methylnaltrexone Bromide Take 450 mg by mouth every morning.   solifenacin 10 MG tablet Commonly known as: VESICARE TAKE ONE TABLET BY MOUTH DAILY   spironolactone 25 MG tablet Commonly known as: ALDACTONE Take 1 tablet (25 mg total) by mouth daily.   topiramate 50 MG tablet Commonly known as: TOPAMAX TAKE ONE TABLET BY MOUTH TWICE DAILY   traZODone 100 MG tablet Commonly known as: DESYREL Take 1 tablet (100 mg total) by mouth at bedtime. What changed: how much to take        Follow-up Information     [facility] Follow up.   Specialty: Home Health  Services Why: [facility] will continue to provide home health services.   They will call you to set up return of services for therapy and nursing home health services. Contact information: [place] [address] [phone]         [name], MD. Schedule an appointment as soon as possible for a visit.   Specialty: Family Medicine Why: Please call the clinic and schedule a post-hospital follow up in the next 7-10 days. Contact information: [address] [place] [address] [phone]                Discharge Exam: Danley Danker Weights   07/06/22 1505 07/06/22 2356 07/11/22 0500  Weight: 108 kg 106.8 kg 103 kg   General exam: Appears calm and comfortable  Respiratory system: Clear to auscultation. Respiratory effort normal. Cardiovascular system: S1 & S2 heard, RRR. No JVD,  Gastrointestinal system: Abdomen is nondistended, soft and nontender. Central nervous system: Alert and oriented. No focal neurological deficits. Extremities: Symmetric 5 x 5 power. Skin: No rashes, lesions or ulcers Psychiatry:  Mood & affect appropriate.    Condition at discharge: fair  The results of significant diagnostics from this hospitalization (including imaging, microbiology, ancillary and laboratory) are listed below for reference.   Imaging Studies: US RENAL  Result Date: 07/09/2022 CLINICAL DATA:  Renal dysfunction EXAM: RENAL / URINARY TRACT ULTRASOUND COMPLETE COMPARISON:  06/02/2022 FINDINGS: Right Kidney: Renal measurements: 11.8 x 5.3 x 5.5 cm = volume: 181.1 mL. Echogenicity within normal limits. No mass or hydronephrosis visualized. Left Kidney: Renal measurements: 12.7 x 6.4 x 5.7 cm = volume: 239.9 mL. Echogenicity within normal limits. No mass or hydronephrosis visualized. Bladder: Appears normal for degree of bladder distention. Other: None. IMPRESSION: There is no hydronephrosis. Electronically Signed   By: Elmer Picker M.D.   On:  07/09/2022 14:04   DG Chest 2 View  Result Date: 07/09/2022 CLINICAL DATA:  Cough for several days EXAM: CHEST - 2 VIEW COMPARISON:  X-ray 07/06/2022 FINDINGS: Underinflation. No consolidation, pneumothorax or effusion. No edema. Normal cardiopericardial silhouette. Fixation hardware scattered diffusely along the  thoracic spine, lower cervical spine and there is evidence of arthroplasty involving the right shoulder. IMPRESSION: Underinflation.  No acute osseous abnormality. Extensive orthopedic hardware Electronically Signed   By: Jill Side M.D.   On: 07/09/2022 12:11   CT Head Wo Contrast  Result Date: 07/06/2022 CLINICAL DATA:  Motor neuron disease suspected EXAM: CT HEAD WITHOUT CONTRAST TECHNIQUE: Contiguous axial images were obtained from the base of the skull through the vertex without intravenous contrast. RADIATION DOSE REDUCTION: This exam was performed according to the departmental dose-optimization program which includes automated exposure control, adjustment of the mA and/or kV according to patient size and/or use of iterative reconstruction technique. COMPARISON:  CT head June 07, 2022. FINDINGS: Brain: No evidence of acute infarction, hemorrhage, hydrocephalus, extra-axial collection or mass lesion/mass effect. Vascular: No hyperdense vessel. Skull: No acute fracture. Sinuses/Orbits: Largely clear sinuses.  No acute orbital findings. Other: No mastoid effusions. IMPRESSION: No acute abnormality. Electronically Signed   By: Margaretha Sheffield M.D.   On: 07/06/2022 15:47   DG Chest Port 1 View  Result Date: 07/06/2022 CLINICAL DATA:  Cough and weakness. EXAM: PORTABLE CHEST 1 VIEW COMPARISON:  06/24/2022. FINDINGS: Clear lungs. Normal heart size and mediastinal contours. No pleural effusion or pneumothorax. Spinal fusion hardware is again noted. IMPRESSION: No evidence of acute cardiopulmonary disease. Electronically Signed   By: Emmit Alexanders M.D.   On: 07/06/2022 15:33    ECHOCARDIOGRAM COMPLETE  Result Date: 06/25/2022    ECHOCARDIOGRAM REPORT   Patient Name:   [name] [name] Date of Exam: 06/25/2022 Medical Rec #:  [id number]        Height:       64.0 in Accession #:    [id number]       Weight:       246.9 lb Date of Birth:  1966/02/14        BSA:          2.140 m Patient Age:    32 years         BP:           158/92 mmHg Patient Gender: F                HR:           57 bpm. Exam Location:  Inpatient Procedure: 2D Echo, Color Doppler and Cardiac Doppler Indications:    congestive heart failure  History:        Patient has prior history of Echocardiogram examinations, most                 recent 08/26/2019. CHF, Arrythmias:LBBB; Signs/Symptoms:Shortness                 of Breath.  Sonographer:    Ronny Flurry Referring Phys: [id number] [place]  1. Left ventricular ejection fraction, by estimation, is 55 to 60%. The left ventricle has normal function. The left ventricle has no regional wall motion abnormalities. Left ventricular diastolic parameters were normal.  2. Right ventricular systolic function is normal. The right ventricular size is normal.  3. Left atrial size was mildly dilated.  4. The mitral valve is normal in structure. Trivial mitral valve regurgitation. No evidence of mitral stenosis.  5. The aortic valve is normal in structure. Aortic valve regurgitation is not visualized. No aortic stenosis is present.  6. The inferior vena cava is normal in size with greater than 50% respiratory variability, suggesting right atrial pressure of 3 mmHg. FINDINGS  Left Ventricle:  Left ventricular ejection fraction, by estimation, is 55 to 60%. The left ventricle has normal function. The left ventricle has no regional wall motion abnormalities. The left ventricular internal cavity size was normal in size. There is  no left ventricular hypertrophy. Left ventricular diastolic parameters were normal. Right Ventricle: The right ventricular size is normal. No increase  in right ventricular wall thickness. Right ventricular systolic function is normal. Left Atrium: Left atrial size was mildly dilated. Right Atrium: Right atrial size was normal in size. Pericardium: There is no evidence of pericardial effusion. Mitral Valve: The mitral valve is normal in structure. Mild mitral annular calcification. Trivial mitral valve regurgitation. No evidence of mitral valve stenosis. Tricuspid Valve: The tricuspid valve is normal in structure. Tricuspid valve regurgitation is mild . No evidence of tricuspid stenosis. Aortic Valve: The aortic valve is normal in structure. Aortic valve regurgitation is not visualized. No aortic stenosis is present. Pulmonic Valve: The pulmonic valve was normal in structure. Pulmonic valve regurgitation is not visualized. No evidence of pulmonic stenosis. Aorta: The aortic root is normal in size and structure. Venous: The inferior vena cava is normal in size with greater than 50% respiratory variability, suggesting right atrial pressure of 3 mmHg. IAS/Shunts: No atrial level shunt detected by color flow Doppler.  LEFT VENTRICLE PLAX 2D LVOT diam:     2.20 cm   Diastology LV SV:         102       LV e' medial:    10.10 cm/s LV SV Index:   48        LV E/e' medial:  11.7 LVOT Area:     3.80 cm  LV e' lateral:   10.10 cm/s                          LV E/e' lateral: 11.7                           3D Volume EF:                          3D EF:        62 %                          LV EDV:       185 ml                          LV ESV:       71 ml                          LV SV:        114 ml RIGHT VENTRICLE             IVC RV S prime:     13.60 cm/s  IVC diam: 2.40 cm TAPSE (M-mode): 2.3 cm LEFT ATRIUM             Index        RIGHT ATRIUM           Index LA Vol (A2C):   57.5 ml 26.87 ml/m  RA Area:     15.15 cm LA Vol (A4C):   55.1 ml 25.75 ml/m  RA Volume:   33.30 ml  15.56 ml/m  LA Biplane Vol: 58.7 ml 27.43 ml/m  AORTIC VALVE LVOT Vmax:   134.67 cm/s LVOT Vmean:   90.300 cm/s LVOT VTI:    0.268 m  AORTA Ao Root diam: 3.40 cm Ao Asc diam:  3.50 cm MITRAL VALVE                TRICUSPID VALVE MV Area (PHT): 4.21 cm     TR Peak grad:   28.5 mmHg MV Decel Time: 180 msec     TR Vmax:        267.00 cm/s MV E velocity: 118.00 cm/s MV A velocity: 83.20 cm/s   SHUNTS MV E/A ratio:  1.42         Systemic VTI:  0.27 m                             Systemic Diam: 2.20 cm Glori Bickers MD Electronically signed by Glori Bickers MD Signature Date/Time: 06/25/2022/4:24:47 PM    Final    CT Angio Chest PE W and/or Wo Contrast  Result Date: 06/24/2022 CLINICAL DATA:  Pulmonary embolism (PE) suspected, high prob Shortness of breath. EXAM: CT ANGIOGRAPHY CHEST WITH CONTRAST TECHNIQUE: Multidetector CT imaging of the chest was performed using the standard protocol during bolus administration of intravenous contrast. Multiplanar CT image reconstructions and MIPs were obtained to evaluate the vascular anatomy. RADIATION DOSE REDUCTION: This exam was performed according to the departmental dose-optimization program which includes automated exposure control, adjustment of the mA and/or kV according to patient size and/or use of iterative reconstruction technique. CONTRAST:  48mL OMNIPAQUE IOHEXOL 350 MG/ML SOLN COMPARISON:  Radiograph earlier today. Chest CTA 09/23/2016 FINDINGS: Cardiovascular: Technically limited exam due to breathing motion, streak artifact from spinal and right arm hardware and soft tissue attenuation from habitus. Allowing for these limitations, there is no obvious pulmonary embolus. Normal caliber thoracic aorta without dissection. Heart is upper normal in size. No pericardial effusion. Mediastinum/Nodes: Scattered small mediastinal and bilateral hilar lymph nodes, not enlarged by size criteria. Minimal hiatal hernia. Lungs/Pleura: Small to moderate bilateral pleural effusions with adjacent compressive atelectasis. Smooth septal thickening and ill-defined ground-glass  typical of pulmonary edema. No pneumothorax. Upper Abdomen: No acute findings. Large volume of stool in the included colon. Musculoskeletal: Reverse right shoulder arthroplasty. Extensive posterior thoracolumbar fusion hardware. Anterior fusion hardware in the lower cervical spine. No acute findings. Review of the MIP images confirms the above findings. IMPRESSION: 1. No obvious pulmonary embolus allowing for limitations as described above. 2. Small to moderate bilateral pleural effusions with adjacent compressive atelectasis. Smooth septal thickening and ill-defined ground-glass typical of pulmonary edema. Electronically Signed   By: Keith Rake M.D.   On: 06/24/2022 17:53   VAS [place] LOWER EXTREMITY VENOUS (DVT)  Result Date: 06/24/2022  Lower Venous DVT Study Patient Name:  [name] [name]  Date of Exam:   06/24/2022 Medical Rec #: [id number]         Accession #:    [id number] Date of Birth: 08-Feb-1966         Patient Gender: F Patient Age:   34 years Exam Location:  [facility] Procedure:      VAS [place] LOWER EXTREMITY VENOUS (DVT) Referring Phys: Davonna Belling --------------------------------------------------------------------------------  Indications: Pitting edema, and Pain.  Comparison Study: No prior study on file Performing Technologist: Sharion Dove RVS  Examination Guidelines: A complete evaluation includes B-mode imaging, spectral Doppler, color Doppler, and power Doppler as needed  of all accessible portions of each vessel. Bilateral testing is considered an integral part of a complete examination. Limited examinations for reoccurring indications may be performed as noted. The reflux portion of the exam is performed with the patient in reverse Trendelenburg.  +-----+---------------+---------+-----------+----------+--------------+ RIGHTCompressibilityPhasicitySpontaneityPropertiesThrombus Aging +-----+---------------+---------+-----------+----------+--------------+ CFV  Full            Yes      Yes                                 +-----+---------------+---------+-----------+----------+--------------+   +---------+---------------+---------+-----------+----------+---------------+ LEFT     CompressibilityPhasicitySpontaneityPropertiesThrombus Aging  +---------+---------------+---------+-----------+----------+---------------+ CFV      Full           Yes      Yes                                  +---------+---------------+---------+-----------+----------+---------------+ SFJ      Full                                                         +---------+---------------+---------+-----------+----------+---------------+ FV Prox  Full                                                         +---------+---------------+---------+-----------+----------+---------------+ FV Mid   Full           Yes      Yes                                  +---------+---------------+---------+-----------+----------+---------------+ FV DistalFull                                                         +---------+---------------+---------+-----------+----------+---------------+ PFV      Full                                                         +---------+---------------+---------+-----------+----------+---------------+ POP      Full           Yes      Yes                                  +---------+---------------+---------+-----------+----------+---------------+ PTV                                                   patent by color +---------+---------------+---------+-----------+----------+---------------+ PERO  patent by color +---------+---------------+---------+-----------+----------+---------------+    Summary: RIGHT: - No evidence of common femoral vein obstruction.  LEFT: - There is no evidence of deep vein thrombosis in the lower extremity.  - No cystic structure found in the popliteal fossa.  *See  table(s) above for measurements and observations. Electronically signed by Orlie Pollen on 06/24/2022 at 4:17:52 PM.    Final    DG Chest 2 View  Result Date: 06/24/2022 CLINICAL DATA:  Shortness of breath. EXAM: CHEST - 2 VIEW COMPARISON:  May 22, 2022. FINDINGS: Stable cardiomediastinal silhouette. Status post surgical posterior fusion of thoracic spine. Minimal bibasilar subsegmental atelectasis or edema is noted with small pleural effusions. IMPRESSION: Minimal bibasilar subsegmental atelectasis or edema with small pleural effusions. Electronically Signed   By: Marijo Conception M.D.   On: 06/24/2022 12:34   Home sleep test  Result Date: 06/22/2022 Star Age, MD     06/28/2022  4:38 PM  [facility] HOME SLEEP TEST (Watch PAT) REPORT STUDY DATE: 06/22/2022 DOB: 08-17-1965 MRN: [id number] ORDERING CLINICIAN: Star Age, MD, PhD  REFERRING CLINICIAN: [name], MD CLINICAL INFORMATION/HISTORY: 57 year old female with an underlying complex medical history of hypertension, asthma, reflux disease, prediabetes, arthritis, anxiety, depression, and severe obesity with a BMI of over 80, who presents for reevaluation of her obstructive sleep apnea.  She has not been on PAP therapy in about 4 years.  Epworth sleepiness score: 18/24. BMI: 41.4 kg/m FINDINGS: Sleep Summary: Total Recording Time (hours, min): 9 hours, 54 min Total Sleep Time (hours, min):  9 hours, 20 min Percent REM (%):    26.4% Respiratory Indices: Calculated pAHI (per hour):  40.4/hour       REM pAHI:    46.9/hour     NREM pAHI: 38.2/hour Central pAHI: 8.5/hour Oxygen Saturation Statistics:  Oxygen Saturation (%) Mean: 93% Minimum oxygen saturation (%):                 75% O2 Saturation Range (%): 75-98%  O2 Saturation (minutes) <=88%: 9.1 min Pulse Rate Statistics: Pulse Mean (bpm):    72/min  Pulse Range (52 - 105/min) IMPRESSION: OSA (obstructive sleep apnea), severe Central Sleep Apnea RECOMMENDATION: This home sleep  test demonstrates severe obstructive sleep apnea with a total AHI of 40.4/hour and O2 nadir of 75%.  There was a milder central sleep apnea component.  Mild to moderate snoring was detected.  Treatment with positive airway pressure is highly recommended. This will require - ideally - a full night CPAP titration study for proper treatment settings, O2 monitoring and mask fitting. For now, the patient will be advised to proceed with an autoPAP titration/trial at home. A laboratory attended titration study can be considered in the future for optimization of treatment settings and to improve tolerance and compliance. Alternative treatment options are limited secondary to the severity of the patient's sleep disordered breathing, but may include surgical treatment with an implantable hypoglossal nerve stimulator (in carefully selected candidates, meeting criteria).  Concomitant weight loss is recommended, where clinically appropriate. Please note, that untreated obstructive sleep apnea may carry additional perioperative morbidity. Patients with significant obstructive sleep apnea should receive perioperative PAP therapy and the surgeons and particularly the anesthesiologist should be informed of the diagnosis and the severity of the sleep disordered breathing. The patient should be cautioned not to drive, work at heights, or operate dangerous or heavy equipment when tired or sleepy. Review and reiteration of good sleep hygiene measures should be pursued with any patient.  Other causes of the patient's symptoms, including circadian rhythm disturbances, an underlying mood disorder, medication effect and/or an underlying medical problem cannot be ruled out based on this test. Clinical correlation is recommended. The patient and her referring provider will be notified of the test results. The patient will be seen in follow up in sleep clinic at [facility]. I certify that I have reviewed the raw data recording prior to the issuance of  this report in accordance with the standards of the American Academy of Sleep Medicine (AASM). INTERPRETING PHYSICIAN: Star Age, MD, PhD Medical Director, [facility]) [place], ABPN (Neurology and Sleep) [facility] [address] [place], [place] [address] [phone]    Microbiology: Results for orders placed or performed during the hospital encounter of 07/06/22  Culture, blood (Routine X 2) w Reflex to ID Panel     Status: None   Collection Time: 07/07/22  6:14 PM   Specimen: BLOOD RIGHT ARM  Result Value Ref Range Status   Specimen Description BLOOD RIGHT ARM  Final   Special Requests   Final    BOTTLES DRAWN AEROBIC AND ANAEROBIC Blood Culture adequate volume   Culture   Final    NO GROWTH 5 DAYS Performed at [facility], [place] [address], [place], [place] [address]    Report Status 07/12/2022 FINAL  Final  Culture, blood (Routine X 2) w Reflex to ID Panel     Status: None   Collection Time: 07/07/22  6:14 PM   Specimen: BLOOD RIGHT ARM  Result Value Ref Range Status   Specimen Description BLOOD RIGHT ARM  Final   Special Requests   Final    BOTTLES DRAWN AEROBIC AND ANAEROBIC Blood Culture adequate volume   Culture   Final    NO GROWTH 5 DAYS Performed at [facility], [place] [address] [place], [place] [address]    Report Status 07/12/2022 FINAL  Final  Group A Strep by PCR     Status: None   Collection Time: 07/09/22  1:51 PM  Result Value Ref Range Status   Group A Strep by PCR NOT DETECTED NOT DETECTED Final    Comment: Performed at [facility], [place] [address], [place], [place] [address]   *Note: Due to a large number of results and/or encounters for the requested time period, some results have not been displayed. A complete set of results can be found in Results Review.    Labs: CBC: Recent Labs  Lab 07/06/22 1513 07/07/22 0320 07/08/22 0336 07/10/22 1053  WBC 5.1 4.4 3.8*  4.0  NEUTROABS 3.6  --  2.0 1.7  HGB 9.8* 9.0* 8.9* 9.0*  HCT 31.1* 26.8* 27.6* 27.8*  MCV 99.7 93.4 94.8 95.9  PLT 199 182 197 [id number]   Basic Metabolic Panel: Recent Labs  Lab 07/06/22 1513 07/07/22 0320 07/08/22 0336 07/09/22 0448 07/10/22 1053 07/11/22 1208  NA 133* 135 134* 134* 131* 136  K 4.1 3.8 3.5 3.2* 4.2 5.0  CL 101 100 102 100 103 102  CO2 22 21* 22 21* 23 25  GLUCOSE 95 94 101* 99 141* 120*  BUN 19 14 19  21* 24* 32*  CREATININE 1.05* 0.99 1.25* 1.23* 1.50* 1.27*  CALCIUM 8.9 8.9 8.8* 8.7* 8.7* 9.3  MG 2.2  --   --   --   --   --    Liver Function Tests: Recent Labs  Lab 07/06/22 1513  AST  16  ALT 12  ALKPHOS 98  BILITOT 0.2*  PROT 7.2  ALBUMIN 3.5   CBG: No results for input(s): "GLUCAP" in the last 168 hours.  Discharge time spent: 40 minutes.   Signed: Hosie Poisson, MD [facility]

## 2022-07-13 ENCOUNTER — Telehealth: Payer: Self-pay | Admitting: *Deleted

## 2022-07-13 ENCOUNTER — Encounter: Payer: Self-pay | Admitting: *Deleted

## 2022-07-13 NOTE — Transitions of Care (Post Inpatient/ED Visit) (Signed)
   07/13/2022  Name: [name] [name] MRN: [id number] DOB: 03/24/1966  Today's TOC FU Call Status: Today's TOC FU Call Status:: Successful TOC FU Call Competed TOC FU Call Complete Date: 07/13/22  Transition Care Management Follow-up Telephone Call Date of Discharge: 07/12/22 Discharge Facility: [facility]) Type of Discharge: Inpatient Admission Primary Inpatient Discharge Diagnosis:: Acute encephalopathy How have you been since you were released from the hospital?: Better Any questions or concerns?: No  Items Reviewed: Did you receive and understand the discharge instructions provided?: Yes Medications obtained and verified?: Yes (Medications Reviewed) Any new allergies since your discharge?: No Dietary orders reviewed?: Yes Type of Diet Ordered:: Low sodium/heart healthy Do you have support at home?: Yes People in Home: child(ren), adult Name of Support/Comfort Primary Source: Delsa Sale (son)  [place] and Equipment/Supplies: [place] Ordered?: Yes Name of [place]:: [place] Has Agency set up a time to come to your home?: No EMR reviewed for [place] Orders: Orders present/patient has not received call (refer to CM for follow-up) (Patient has contact number [phone]) and plans to call and make sure they have her correct telephone number) Any new equipment or medical supplies ordered?: No  Functional Questionnaire: Do you need assistance with bathing/showering or dressing?: Yes Do you need assistance with meal preparation?: Yes Do you need assistance with eating?: No Do you have difficulty maintaining continence: No Do you need assistance with getting out of bed/getting out of a chair/moving?: No Do you have difficulty managing or taking your medications?: No  Follow up appointments reviewed: PCP Follow-up appointment confirmed?: Yes Date of PCP follow-up appointment?: 07/19/22 Follow-up Provider: [facility]  Follow-up appointment confirmed?: NA Do you need transportation to your follow-up appointment?: No Do you understand care options if your condition(s) worsen?: Yes-patient verbalized understanding  SDOH Interventions Today    Flowsheet Row Most Recent Value  SDOH Interventions   Housing Interventions Intervention Not Indicated  Transportation Interventions Intervention Not Indicated  Financial Strain Interventions Intervention Not Indicated      Chong Sicilian, BSN, RN-BC RN Care Coordinator [place]: [phone] Main #: [phone]

## 2022-07-16 IMAGING — CR DG HUMERUS 2V *R*
2 series · 2 of 2 positions shown · non-contrast
Comparison: 01/06/2021

CLINICAL DATA: Right shoulder pain, injury

EXAM:
RIGHT SHOULDER - 2+ VIEW; RIGHT HUMERUS - 2+ VIEW

[humerus ap]
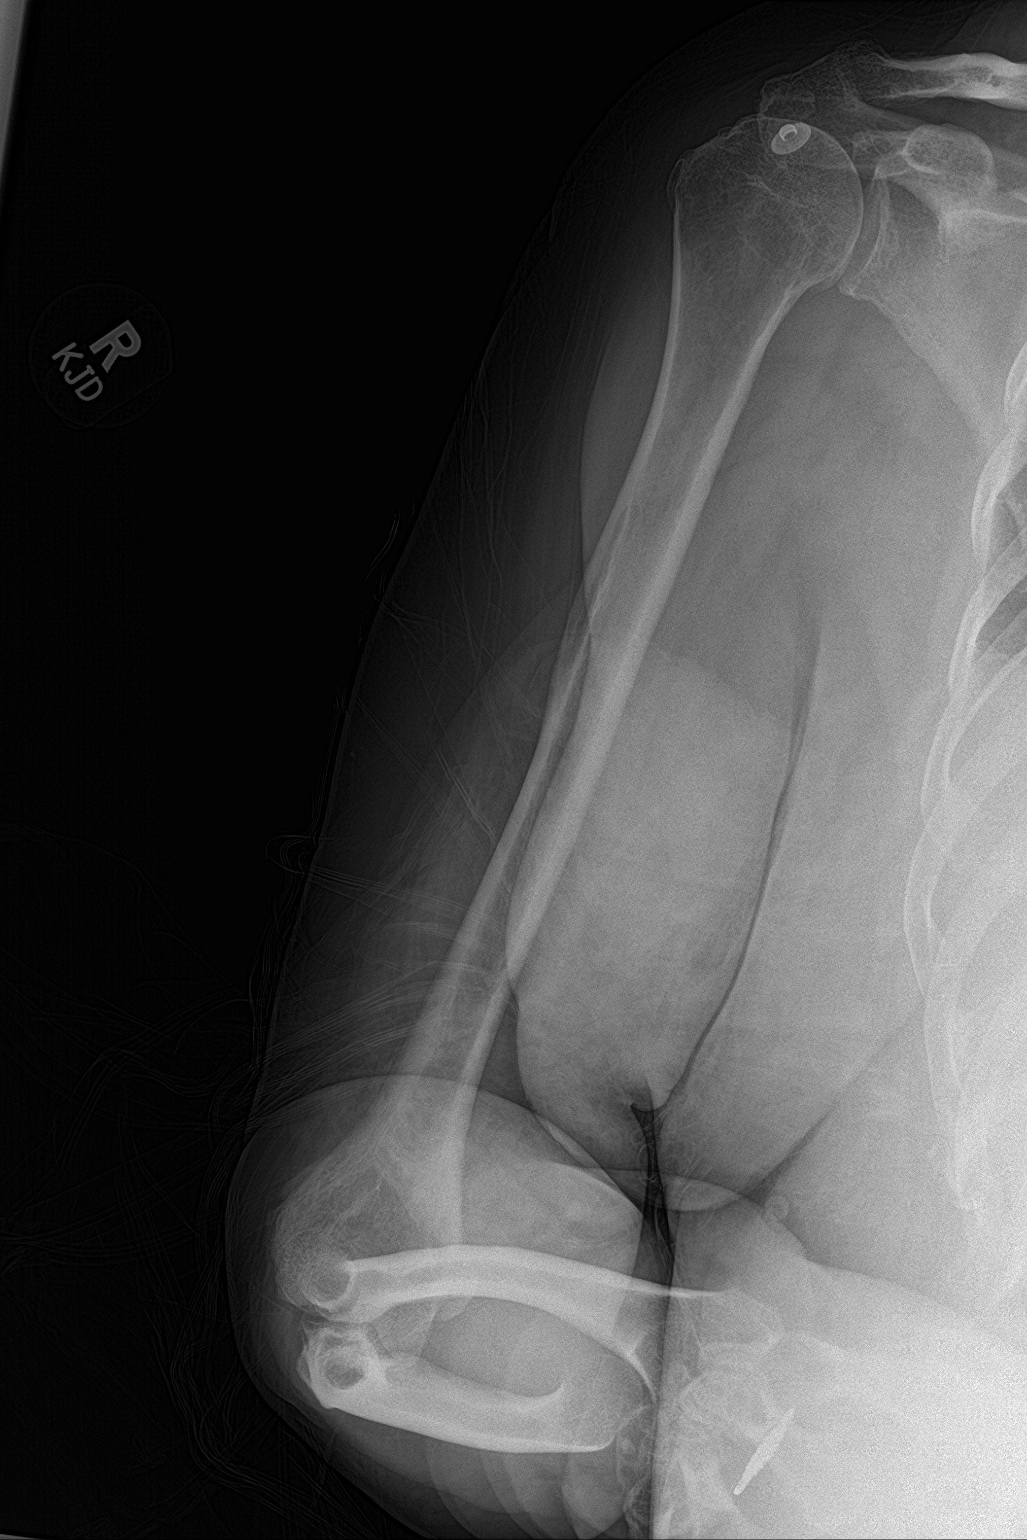

[humerus lat]
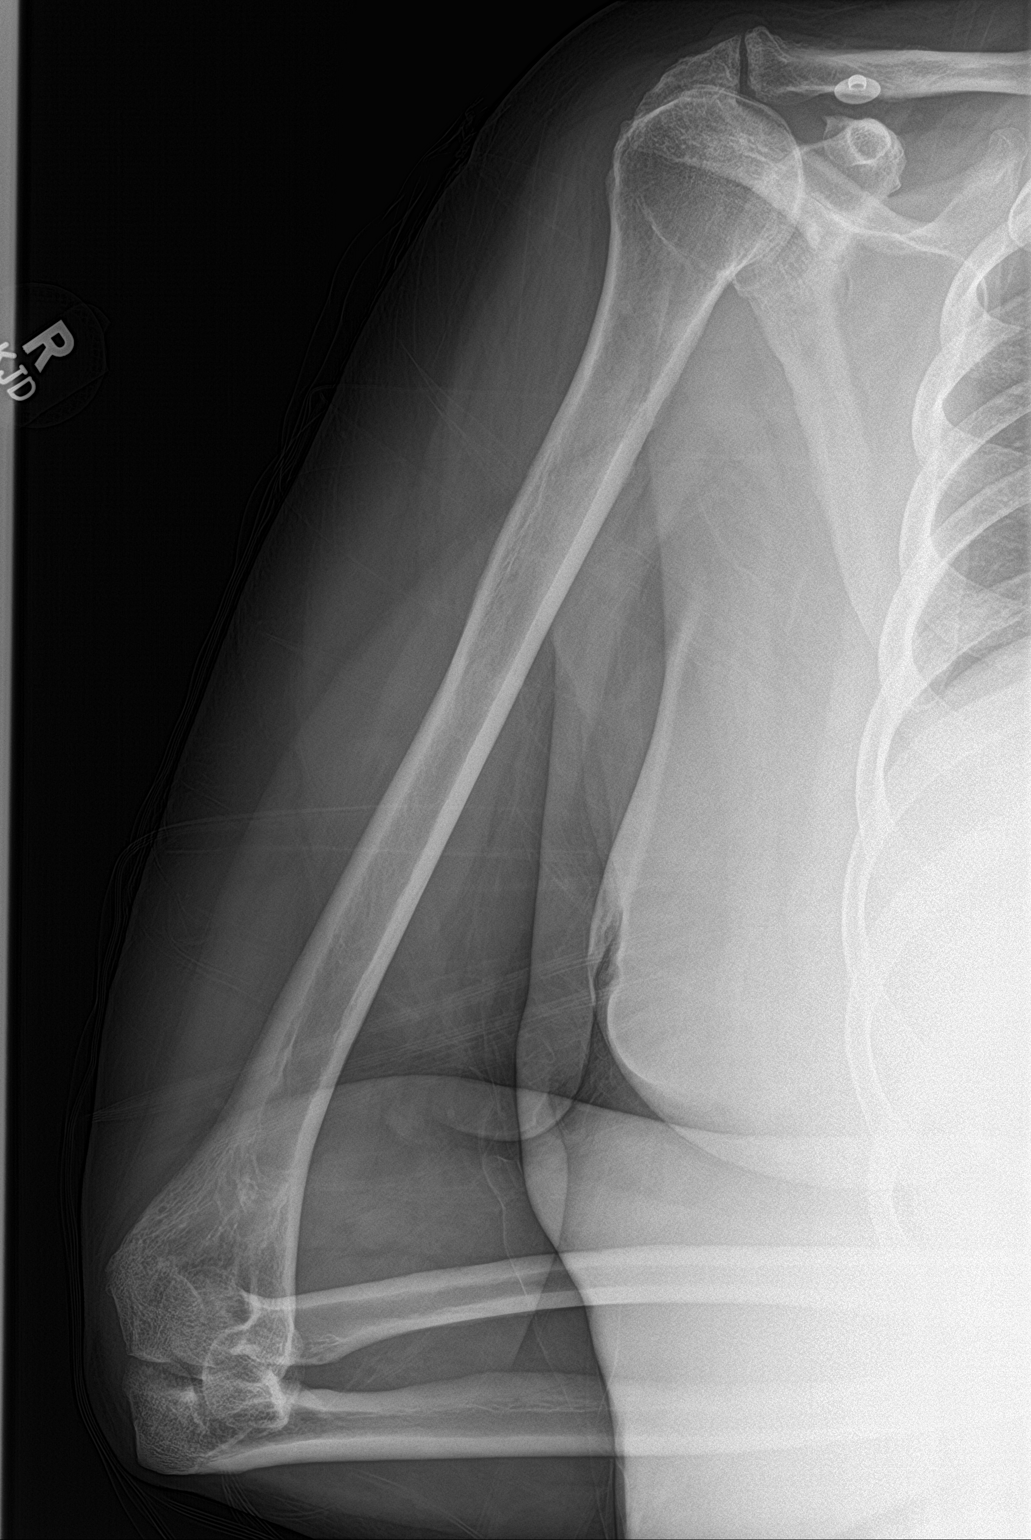

[2 of 2 positions shown; findings below may reference images not displayed]

FINDINGS: Right shoulder: Internal rotation, external rotation, transscapular
views are obtained. Stable mild osteoarthritis of the
acromioclavicular and glenohumeral joints. On the transscapular
view, there is subtle cortical irregularity along the posterior
aspect of the proximal humeral diaphysis. Small avulsion type
fracture cannot be excluded, and further evaluation with CT may be
useful. No other acute bony abnormalities. Alignment is anatomic.
Visualized portions of the right chest are clear. Stable
postsurgical changes of the thoracic spine.

Right humerus: Frontal and lateral views are obtained. There are no
acute fractures. Alignment is anatomic. Soft tissues are normal.
IMPRESSION: 1. Subtle cortical irregularity seen posteriorly along the proximal
humeral diaphysis on the transscapular view, which could reflect a
small avulsion fracture. This is not seen on any of the other
images. Further evaluation with CT scan could be considered.
2. Stable right shoulder osteoarthritis.

## 2022-07-16 IMAGING — CR DG SHOULDER 2+V*R*
3 series · 3 of 3 positions shown · non-contrast
Comparison: 01/06/2021

CLINICAL DATA: Right shoulder pain, injury

EXAM:
RIGHT SHOULDER - 2+ VIEW; RIGHT HUMERUS - 2+ VIEW

[shoulder grashey]
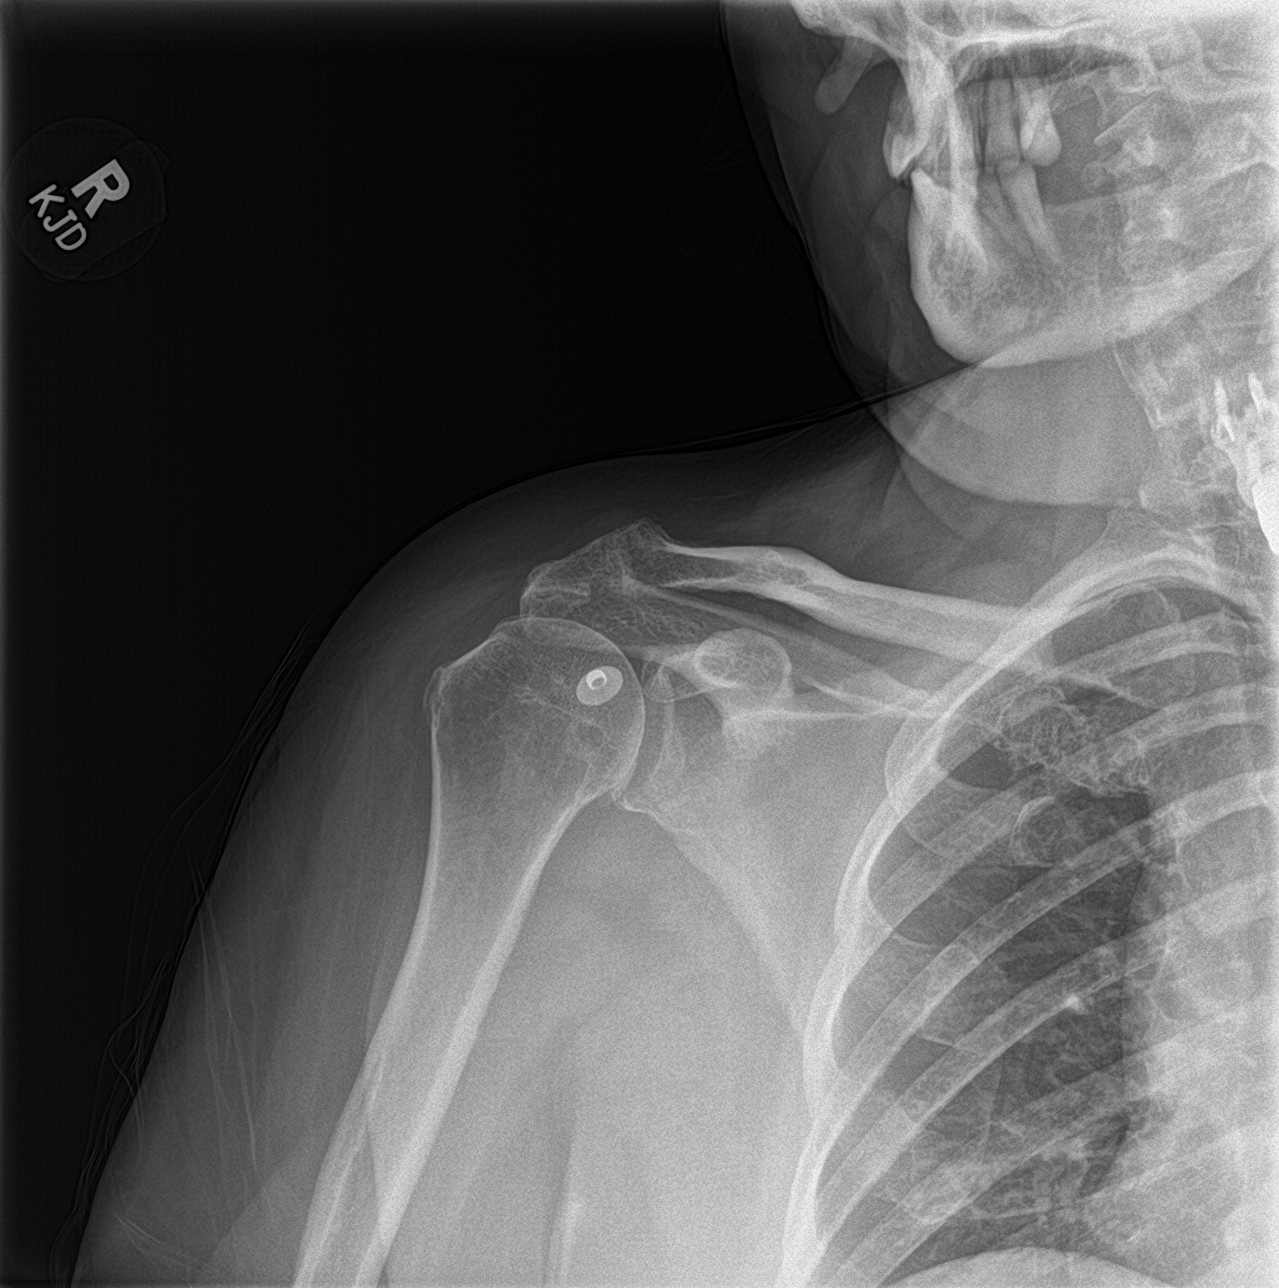

[shoulder y view]
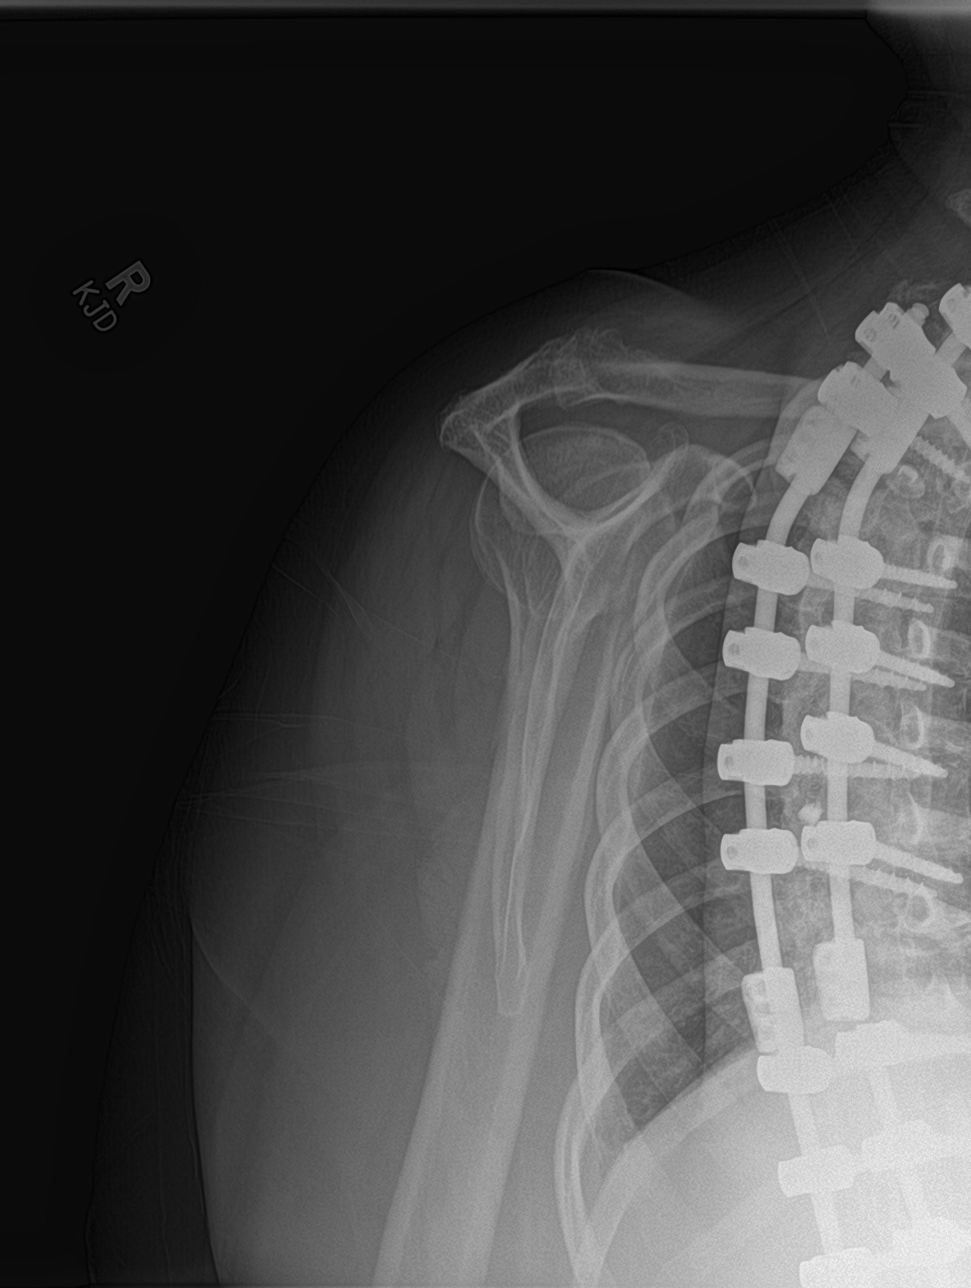

[shoulder ap neutral]
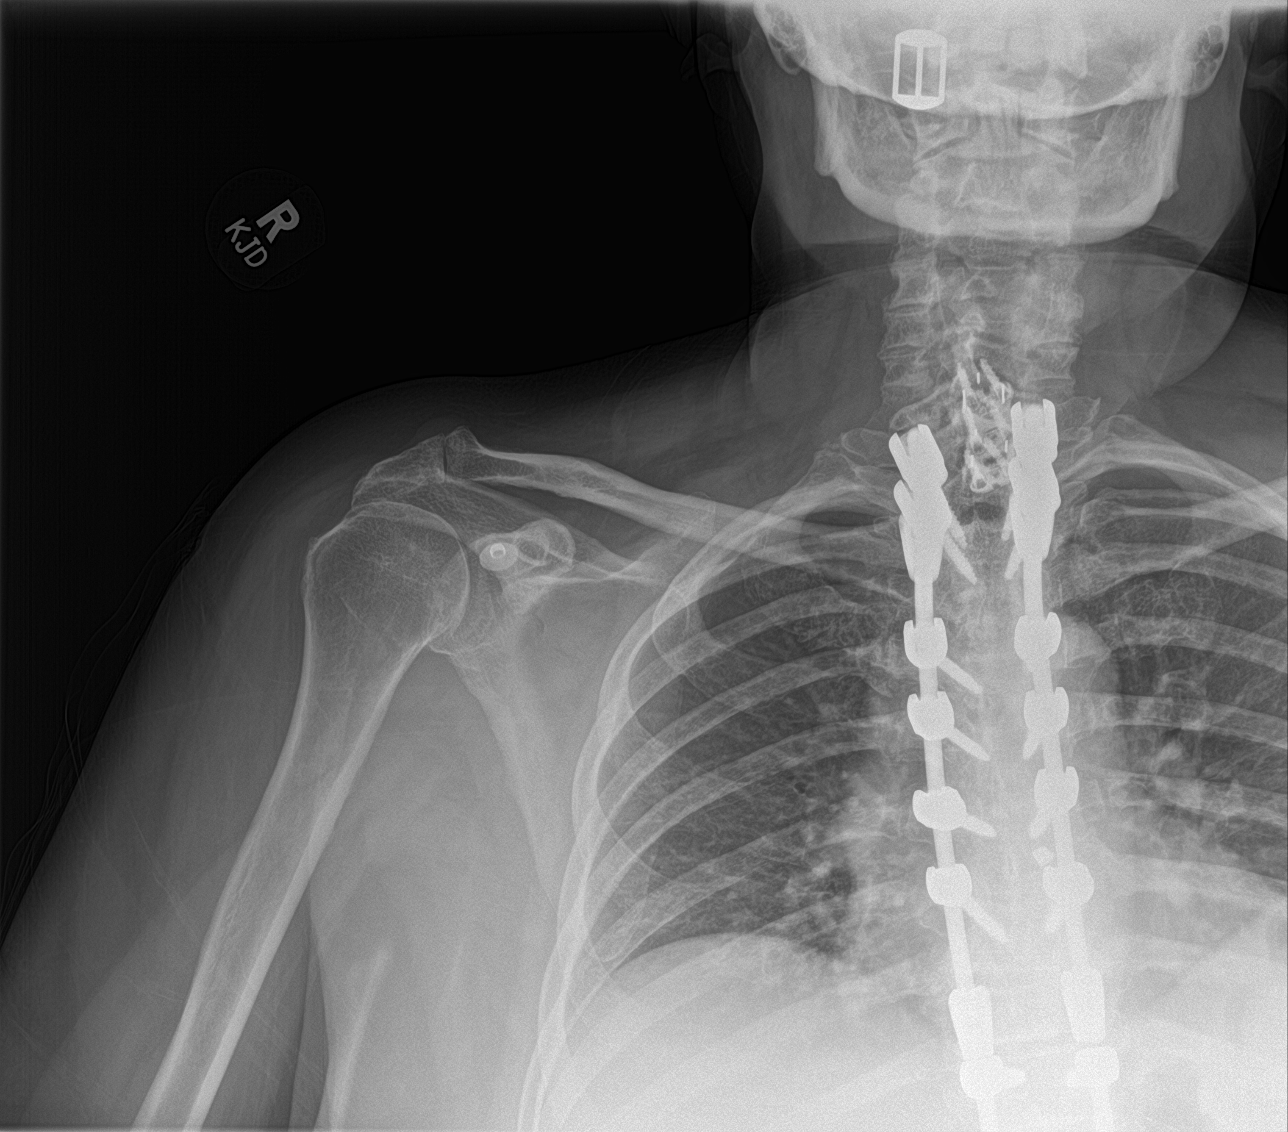

[3 of 3 positions shown; findings below may reference images not displayed]

FINDINGS: Right shoulder: Internal rotation, external rotation, transscapular
views are obtained. Stable mild osteoarthritis of the
acromioclavicular and glenohumeral joints. On the transscapular
view, there is subtle cortical irregularity along the posterior
aspect of the proximal humeral diaphysis. Small avulsion type
fracture cannot be excluded, and further evaluation with CT may be
useful. No other acute bony abnormalities. Alignment is anatomic.
Visualized portions of the right chest are clear. Stable
postsurgical changes of the thoracic spine.

Right humerus: Frontal and lateral views are obtained. There are no
acute fractures. Alignment is anatomic. Soft tissues are normal.
IMPRESSION: 1. Subtle cortical irregularity seen posteriorly along the proximal
humeral diaphysis on the transscapular view, which could reflect a
small avulsion fracture. This is not seen on any of the other
images. Further evaluation with CT scan could be considered.
2. Stable right shoulder osteoarthritis.

## 2022-07-17 IMAGING — CT CT SHOULDER*R* W/O CM
3 of 4 series · 15 of 35 positions shown, 19 images · non-contrast
Comparison: Radiographs 04/17/2021

CLINICAL DATA: Fell.  Right shoulder pain.

EXAM:
CT OF THE UPPER RIGHT EXTREMITY WITHOUT CONTRAST
TECHNIQUE: Multidetector CT imaging of the upper right extremity was performed
according to the standard protocol.

[Series 3: shoulder 3.0 (person_name)31(person_name) (person_ · axial · 0.54mm/px · z∈[-692,-520]mm · 9 of 69 slices shown, 12 images]
[im 6/69  soft-tissue]
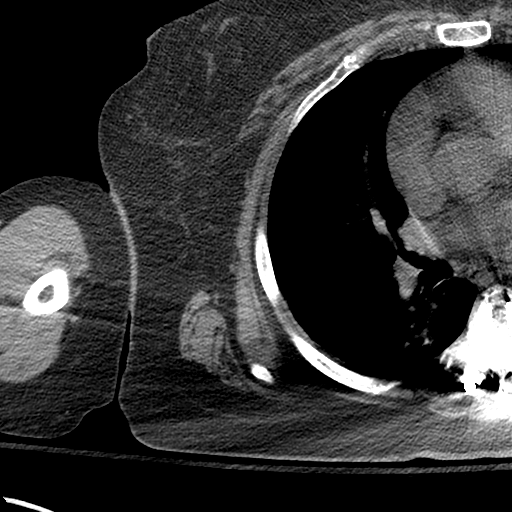
[im 6/69  bone]
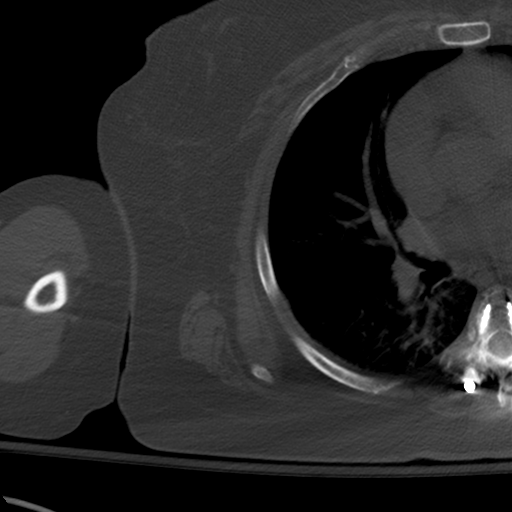
[im 16/69  bone]
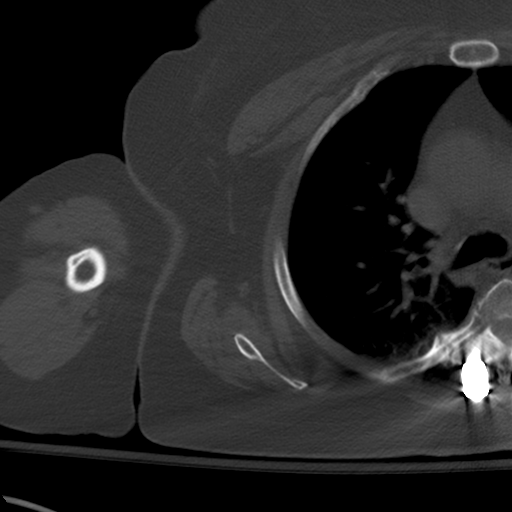
[im 21/69  bone]
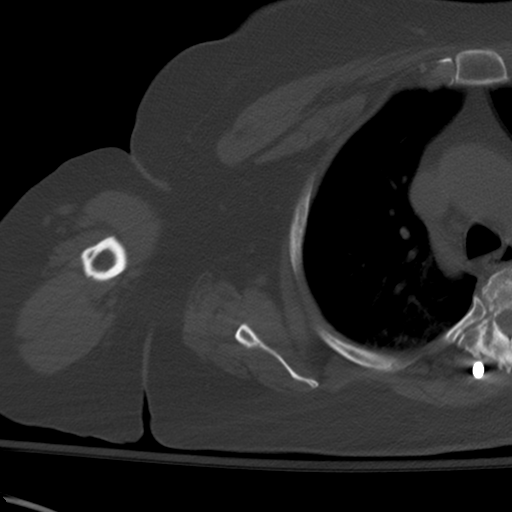
[im 27/69  bone]
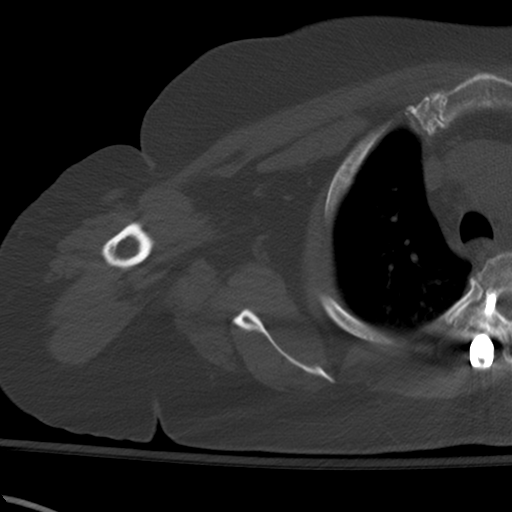
[im 37/69  soft-tissue]
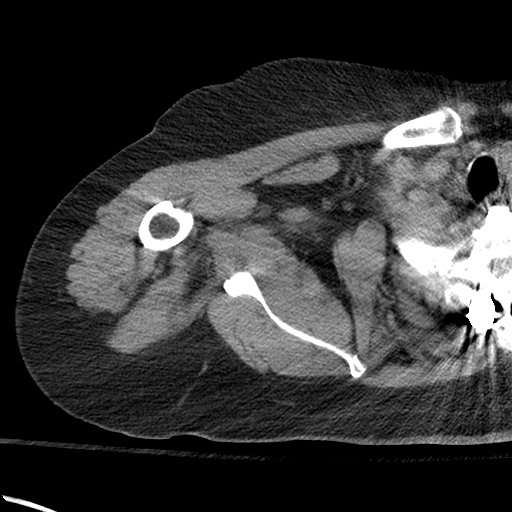
[im 37/69  bone]
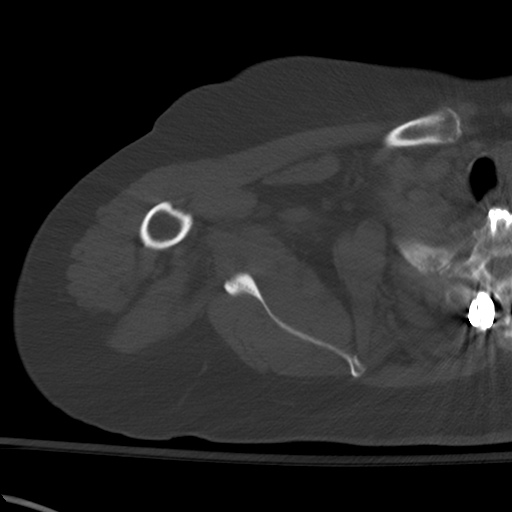
[im 42/69  bone]
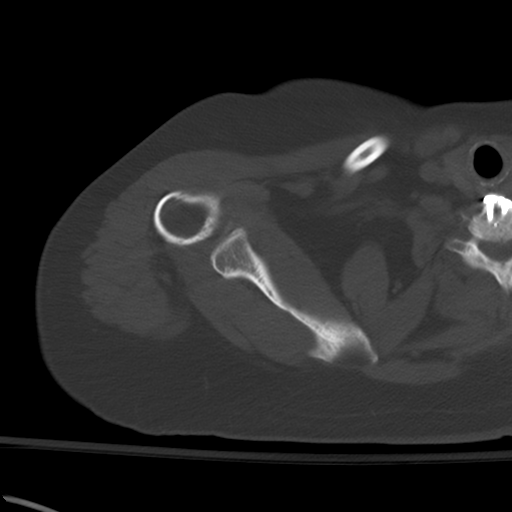
[im 48/69  bone]
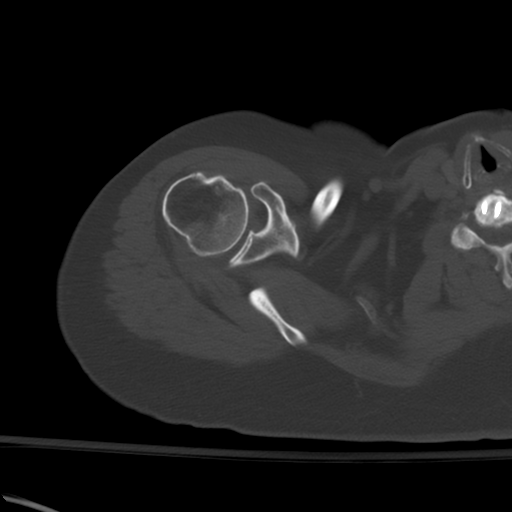
[im 58/69  bone]
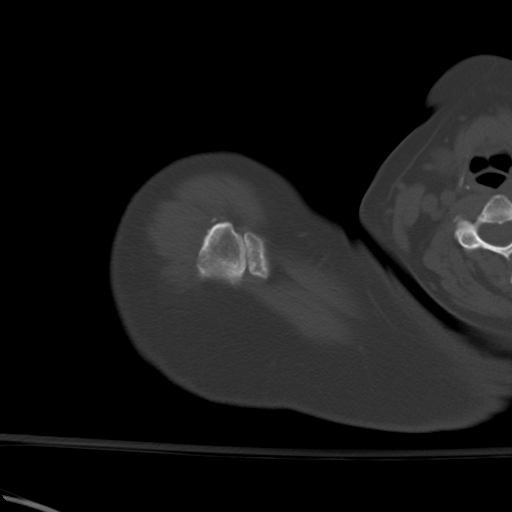
[im 63/69  soft-tissue]
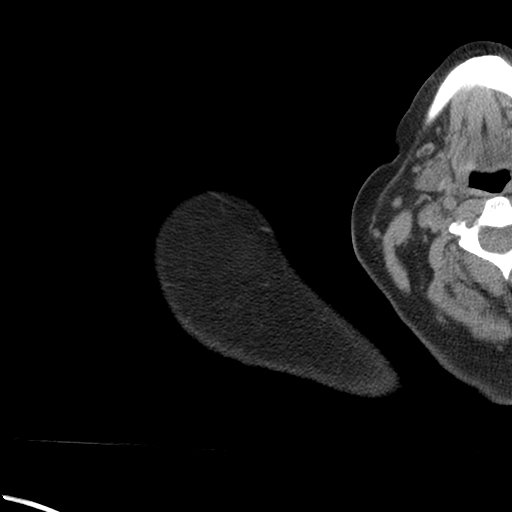
[im 63/69  bone]
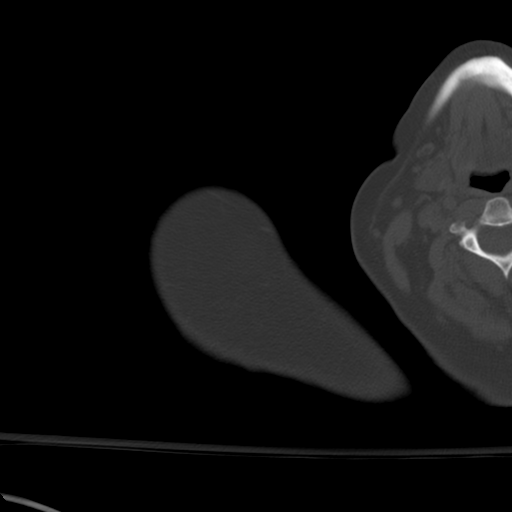

[Series 9: cor st · coronal · 0.48mm/px · 1 of 115 slices shown]
[im 58/115  bone]
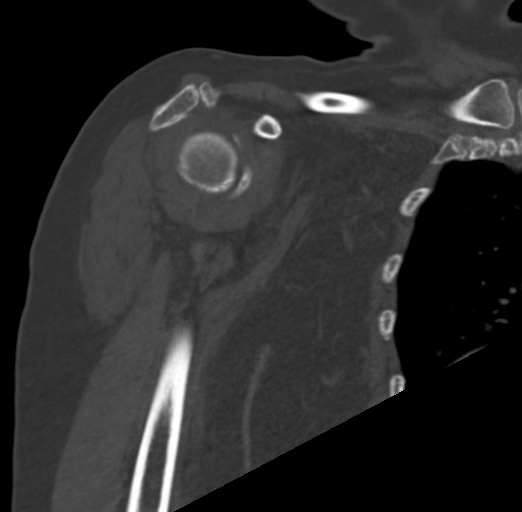

[Series 10: sag st · sagittal · 0.47mm/px · 5 of 117 slices shown, 6 images]
[im 39/117  bone]
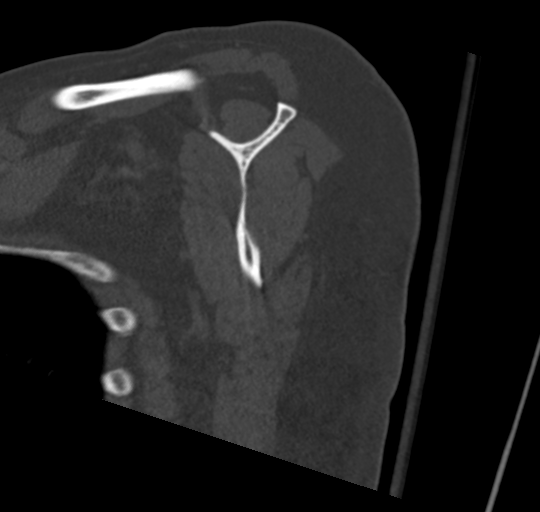
[im 49/117  bone]
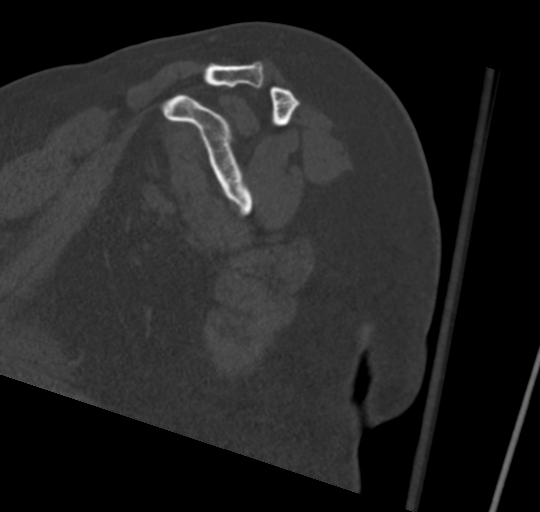
[im 59/117  soft-tissue]
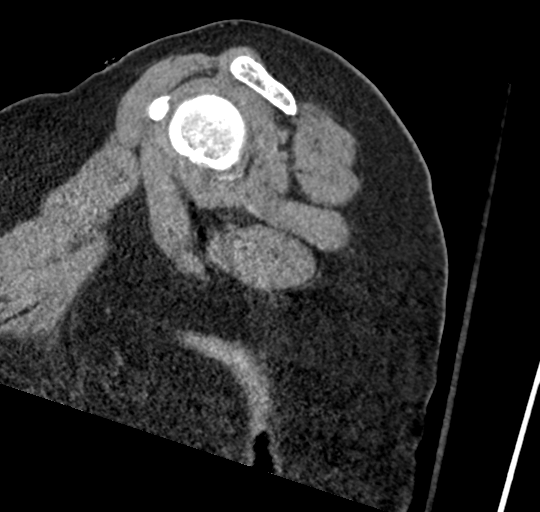
[im 59/117  bone]
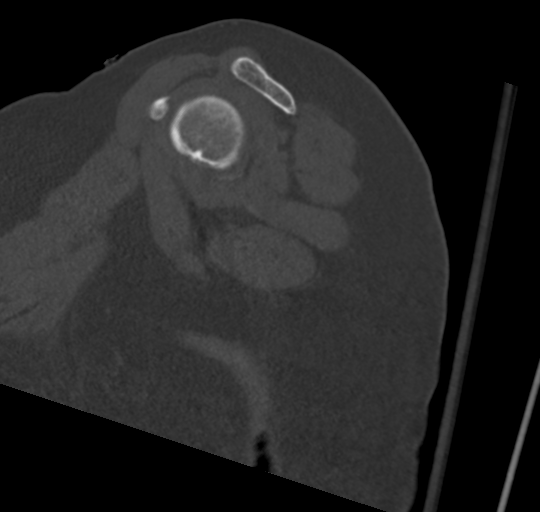
[im 68/117  bone]
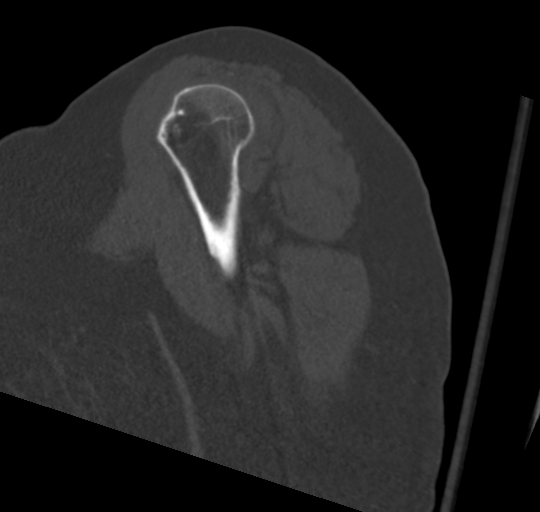
[im 78/117  bone]
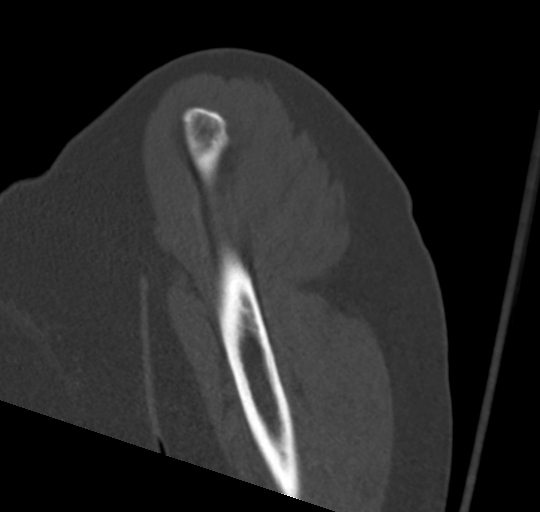

[15 of 35 positions shown; findings below may reference images not displayed]

FINDINGS: The glenohumeral and AC joints are intact. No acute fractures of the
right shoulder are identified. No humeral, scapular or clavicle
fracture. The visualized right ribs are intact the visualized right
lung is grossly clear.

Grossly by CT the rotator cuff tendons are intact.
IMPRESSION: No acute fractures or obvious rotator cuff tear.

## 2022-07-18 ENCOUNTER — Emergency Department: Payer: 59

## 2022-07-18 ENCOUNTER — Inpatient Hospital Stay
Admission: EM | Admit: 2022-07-18 | Discharge: 2022-07-23 | DRG: 872 | Disposition: A | Discharge status: Home Health | Payer: 59 | Attending: Internal Medicine | Admitting: Internal Medicine

## 2022-07-18 ENCOUNTER — Other Ambulatory Visit: Payer: Self-pay

## 2022-07-18 ENCOUNTER — Other Ambulatory Visit: Payer: Self-pay | Admitting: Family Medicine

## 2022-07-18 DIAGNOSIS — E876 Hypokalemia: Secondary | ICD-10-CM | POA: Diagnosis present

## 2022-07-18 DIAGNOSIS — A4151 Sepsis due to Escherichia coli [E. coli]: Principal | ICD-10-CM | POA: Diagnosis present

## 2022-07-18 DIAGNOSIS — R300 Dysuria: Secondary | ICD-10-CM | POA: Diagnosis not present

## 2022-07-18 DIAGNOSIS — N183 Chronic kidney disease, stage 3 unspecified: Secondary | ICD-10-CM

## 2022-07-18 DIAGNOSIS — F319 Bipolar disorder, unspecified: Secondary | ICD-10-CM | POA: Diagnosis present

## 2022-07-18 DIAGNOSIS — R6520 Severe sepsis without septic shock: Secondary | ICD-10-CM | POA: Diagnosis present

## 2022-07-18 DIAGNOSIS — N179 Acute kidney failure, unspecified: Secondary | ICD-10-CM | POA: Diagnosis present

## 2022-07-18 DIAGNOSIS — Z888 Allergy status to other drugs, medicaments and biological substances status: Secondary | ICD-10-CM

## 2022-07-18 DIAGNOSIS — R7303 Prediabetes: Secondary | ICD-10-CM | POA: Diagnosis present

## 2022-07-18 DIAGNOSIS — N10 Acute tubulo-interstitial nephritis: Principal | ICD-10-CM

## 2022-07-18 DIAGNOSIS — Z89422 Acquired absence of other left toe(s): Secondary | ICD-10-CM

## 2022-07-18 DIAGNOSIS — I959 Hypotension, unspecified: Secondary | ICD-10-CM

## 2022-07-18 DIAGNOSIS — G4730 Sleep apnea, unspecified: Secondary | ICD-10-CM | POA: Diagnosis present

## 2022-07-18 DIAGNOSIS — M533 Sacrococcygeal disorders, not elsewhere classified: Secondary | ICD-10-CM

## 2022-07-18 DIAGNOSIS — N1831 Chronic kidney disease, stage 3a: Secondary | ICD-10-CM | POA: Diagnosis present

## 2022-07-18 DIAGNOSIS — I5032 Chronic diastolic (congestive) heart failure: Secondary | ICD-10-CM | POA: Diagnosis present

## 2022-07-18 DIAGNOSIS — Z881 Allergy status to other antibiotic agents status: Secondary | ICD-10-CM

## 2022-07-18 DIAGNOSIS — N3001 Acute cystitis with hematuria: Secondary | ICD-10-CM | POA: Diagnosis present

## 2022-07-18 DIAGNOSIS — I130 Hypertensive heart and chronic kidney disease with heart failure and stage 1 through stage 4 chronic kidney disease, or unspecified chronic kidney disease: Secondary | ICD-10-CM | POA: Diagnosis present

## 2022-07-18 DIAGNOSIS — Z1152 Encounter for screening for COVID-19: Secondary | ICD-10-CM

## 2022-07-18 DIAGNOSIS — J45909 Unspecified asthma, uncomplicated: Secondary | ICD-10-CM | POA: Diagnosis present

## 2022-07-18 DIAGNOSIS — F419 Anxiety disorder, unspecified: Secondary | ICD-10-CM | POA: Diagnosis present

## 2022-07-18 DIAGNOSIS — R110 Nausea: Secondary | ICD-10-CM | POA: Diagnosis present

## 2022-07-18 DIAGNOSIS — Z885 Allergy status to narcotic agent status: Secondary | ICD-10-CM

## 2022-07-18 DIAGNOSIS — D649 Anemia, unspecified: Secondary | ICD-10-CM | POA: Diagnosis present

## 2022-07-18 DIAGNOSIS — N182 Chronic kidney disease, stage 2 (mild): Secondary | ICD-10-CM | POA: Diagnosis present

## 2022-07-18 DIAGNOSIS — D638 Anemia in other chronic diseases classified elsewhere: Secondary | ICD-10-CM

## 2022-07-18 DIAGNOSIS — I10 Essential (primary) hypertension: Secondary | ICD-10-CM | POA: Diagnosis present

## 2022-07-18 DIAGNOSIS — E871 Hypo-osmolality and hyponatremia: Secondary | ICD-10-CM | POA: Diagnosis present

## 2022-07-18 DIAGNOSIS — K219 Gastro-esophageal reflux disease without esophagitis: Secondary | ICD-10-CM | POA: Diagnosis present

## 2022-07-18 DIAGNOSIS — Z79818 Long term (current) use of other agents affecting estrogen receptors and estrogen levels: Secondary | ICD-10-CM

## 2022-07-18 DIAGNOSIS — D631 Anemia in chronic kidney disease: Secondary | ICD-10-CM | POA: Diagnosis present

## 2022-07-18 DIAGNOSIS — Z79899 Other long term (current) drug therapy: Secondary | ICD-10-CM

## 2022-07-18 DIAGNOSIS — Z96611 Presence of right artificial shoulder joint: Secondary | ICD-10-CM | POA: Diagnosis present

## 2022-07-18 DIAGNOSIS — A419 Sepsis, unspecified organism: Secondary | ICD-10-CM

## 2022-07-18 HISTORY — DX: Syncope and collapse: R55

## 2022-07-18 LAB — CBC WITH DIFFERENTIAL/PLATELET
Abs Immature Granulocytes: 0.09 10*3/uL — ABNORMAL HIGH (ref 0.00–0.07)
Basophils Absolute: 0 10*3/uL (ref 0.0–0.1)
Basophils Relative: 0 %
Eosinophils Absolute: 0 10*3/uL (ref 0.0–0.5)
Eosinophils Relative: 0 %
HCT: 26.1 % — ABNORMAL LOW (ref 36.0–46.0)
Hemoglobin: 8.6 g/dL — ABNORMAL LOW (ref 12.0–15.0)
Immature Granulocytes: 1 %
Lymphocytes Relative: 12 %
Lymphs Abs: 1.5 10*3/uL (ref 0.7–4.0)
MCH: 30.7 pg (ref 26.0–34.0)
MCHC: 33 g/dL (ref 30.0–36.0)
MCV: 93.2 fL (ref 80.0–100.0)
Monocytes Absolute: 1 10*3/uL (ref 0.1–1.0)
Monocytes Relative: 8 %
Neutro Abs: 10 10*3/uL — ABNORMAL HIGH (ref 1.7–7.7)
Neutrophils Relative %: 79 %
Platelets: 260 10*3/uL (ref 150–400)
RBC: 2.8 MIL/uL — ABNORMAL LOW (ref 3.87–5.11)
RDW: 13.2 % (ref 11.5–15.5)
WBC: 12.7 10*3/uL — ABNORMAL HIGH (ref 4.0–10.5)
nRBC: 0 % (ref 0.0–0.2)

## 2022-07-18 LAB — COMPREHENSIVE METABOLIC PANEL
ALT: 12 U/L (ref 0–44)
AST: 18 U/L (ref 15–41)
Albumin: 3.3 g/dL — ABNORMAL LOW (ref 3.5–5.0)
Alkaline Phosphatase: 87 U/L (ref 38–126)
Anion gap: 8 (ref 5–15)
BUN: 26 mg/dL — ABNORMAL HIGH (ref 6–20)
CO2: 21 mmol/L — ABNORMAL LOW (ref 22–32)
Calcium: 8.8 mg/dL — ABNORMAL LOW (ref 8.9–10.3)
Chloride: 102 mmol/L (ref 98–111)
Creatinine, Ser: 1.28 mg/dL — ABNORMAL HIGH (ref 0.44–1.00)
GFR, Estimated: 49 mL/min — ABNORMAL LOW (ref >60)
Glucose, Bld: 108 mg/dL — ABNORMAL HIGH (ref 70–99)
Potassium: 3.9 mmol/L (ref 3.5–5.1)
Sodium: 131 mmol/L — ABNORMAL LOW (ref 135–145)
Total Bilirubin: 0.8 mg/dL (ref 0.3–1.2)
Total Protein: 7.2 g/dL (ref 6.5–8.1)

## 2022-07-18 LAB — TROPONIN I (HIGH SENSITIVITY): Troponin I (High Sensitivity): 4 ng/L (ref <18)

## 2022-07-18 LAB — POC URINE PREG, ED: Preg Test, Ur: NEGATIVE

## 2022-07-18 LAB — PROCALCITONIN: Procalcitonin: 0.17 ng/mL

## 2022-07-18 LAB — URINALYSIS, ROUTINE W REFLEX MICROSCOPIC
Bilirubin Urine: NEGATIVE
Glucose, UA: NEGATIVE mg/dL
Ketones, ur: NEGATIVE mg/dL
Nitrite: NEGATIVE
Protein, ur: >=300 mg/dL — AB
RBC / HPF: >50 RBC/hpf (ref 0–5)
Specific Gravity, Urine: 1.017 (ref 1.005–1.030)
WBC, UA: >50 WBC/hpf (ref 0–5)
pH: 5 (ref 5.0–8.0)

## 2022-07-18 LAB — LACTIC ACID, PLASMA
Lactic Acid, Venous: 1.2 mmol/L (ref 0.5–1.9)
Lactic Acid, Venous: 1.6 mmol/L (ref 0.5–1.9)

## 2022-07-18 LAB — RESP PANEL BY RT-PCR (RSV, FLU A&B, COVID)  RVPGX2
Influenza A by PCR: NEGATIVE
Influenza B by PCR: NEGATIVE
Resp Syncytial Virus by PCR: NEGATIVE
SARS Coronavirus 2 by RT PCR: NEGATIVE

## 2022-07-18 MED ORDER — IOHEXOL 300 MG/ML  SOLN
100.0000 mL | Freq: Once | INTRAMUSCULAR | Status: AC | PRN
  Administered 2022-07-18: 100 mL via INTRAVENOUS

## 2022-07-18 MED ORDER — HEPARIN SODIUM (PORCINE) 5000 UNIT/ML IJ SOLN
5000.0000 [IU] | Freq: Three times a day (TID) | INTRAMUSCULAR | Status: DC
  Administered 2022-07-18 – 2022-07-23 (×15): 5000 [IU] via SUBCUTANEOUS
  Filled 2022-07-18 (×14): qty 1

## 2022-07-18 MED ORDER — SODIUM CHLORIDE 0.9 % IV SOLN: INTRAVENOUS | Status: DC

## 2022-07-18 MED ORDER — ACETAMINOPHEN 500 MG PO TABS
1000.0000 mg | ORAL_TABLET | Freq: Once | ORAL | Status: AC
  Administered 2022-07-18: 1000 mg via ORAL
  Filled 2022-07-18: qty 2

## 2022-07-18 MED ORDER — SODIUM CHLORIDE 0.9% FLUSH
3.0000 mL | Freq: Two times a day (BID) | INTRAVENOUS | Status: DC
  Administered 2022-07-18 – 2022-07-23 (×6): 3 mL via INTRAVENOUS

## 2022-07-18 MED ORDER — ACETAMINOPHEN 650 MG RE SUPP: 650.0000 mg | Freq: Four times a day (QID) | RECTAL | Status: DC | PRN

## 2022-07-18 MED ORDER — LACTATED RINGERS IV BOLUS
1000.0000 mL | Freq: Once | INTRAVENOUS | Status: AC
  Administered 2022-07-18: 1000 mL via INTRAVENOUS

## 2022-07-18 MED ORDER — HYDROCODONE-ACETAMINOPHEN 5-325 MG PO TABS
1.0000 | ORAL_TABLET | ORAL | Status: DC | PRN
  Administered 2022-07-19 – 2022-07-22 (×2): 1 via ORAL
  Filled 2022-07-18 (×2): qty 1

## 2022-07-18 MED ORDER — ACETAMINOPHEN 325 MG PO TABS
650.0000 mg | ORAL_TABLET | Freq: Four times a day (QID) | ORAL | Status: DC | PRN
  Administered 2022-07-19: 650 mg via ORAL
  Filled 2022-07-18: qty 2

## 2022-07-18 MED ORDER — SODIUM CHLORIDE 0.9 % IV SOLN
2.0000 g | INTRAVENOUS | Status: AC
  Administered 2022-07-18 – 2022-07-21 (×4): 2 g via INTRAVENOUS
  Filled 2022-07-18 (×2): qty 20
  Filled 2022-07-18 (×2): qty 2

## 2022-07-18 NOTE — Assessment & Plan Note (Signed)
IV ppi,. IV Zofran.

## 2022-07-18 NOTE — Sepsis Progress Note (Signed)
Elink following code sepsis °

## 2022-07-18 NOTE — Assessment & Plan Note (Signed)
Hold BP meds and ACEI.  Lab Results  Component Value Date   CREATININE 1.28 (H) 07/18/2022   CREATININE 1.27 (H) 07/11/2022   CREATININE 1.50 (H) 07/10/2022

## 2022-07-18 NOTE — Assessment & Plan Note (Deleted)
Vitals:   07/18/22 1729 07/18/22 1800 07/18/22 1930 07/18/22 2000  BP: 127/70 (!) 143/76 (!) 125/54 126/65   07/18/22 2100  BP: (!) 106/56  Stable.  We will gold diuretic. Hold aldactone. Hold ACEI

## 2022-07-18 NOTE — ED Notes (Signed)
Pt is asking for medication for pain. MD made aware.

## 2022-07-18 NOTE — Assessment & Plan Note (Signed)
Pt has h/o chronic anemia.  Type/ screen.  IV PPI. FOBT.

## 2022-07-18 NOTE — ED Notes (Signed)
Pt advised her son called EMS for shallow breathing and the fever. Pt is taking regularly doses of oxycodone at home for pain. Pt is having a hard time staying awake during triage. Pt is arousal with verbal stimulation.

## 2022-07-18 NOTE — H&P (Signed)
History and Physical     Patient: [name] [name] [id number] DOB: Jun 02, 1965 DOA: 07/18/2022 DOS: the patient was seen and examined on 07/18/2022 PCP: [name], MD   Patient coming from: Home  Chief Complaint: Dysuria.   HISTORY OF PRESENT ILLNESS: [name] [name] is an 57 y.o. female seen for dysuria.  Poor historian.  Sepsis/ UTI. 1 week dysuria. Cough/ sob. Febrile 102. HR above 90 Lactic  BL Flank pain. CT : Bladder wall thickening.  MIVF. Rocephin and tylenol.  Resp panel is negative. . Today patient is brought by EMS for urinary tract infection symptoms and dysuria symptoms along with upper respiratory symptoms of congestion and cough and runny nose.  Patient also reports nausea and vomiting.  EMS was called by family member for her breathing and fever.  Patient does report diffuse abdominal pain today and chest discomfort which is similar to her previous episode of chest discomforts.  Chart review shows that patient was recently admitted and discharged for acute encephalopathy on 25 March.  During that admission patient had been reportedly somnolent and was admitted for altered mental status and fever and CTA negative for any PE CT negative for any acute intracranial abnormality.  Electrolyte abnormalities corrected as patient had hyponatremia.  Anemia of chronic disease was stable and patient was advised to continue her iron supplementation.  Patient does have history of asthma and uses as needed albuterol patient also has a history of chronic diastolic CHF and patient's Lasix was restarted during her recent discharge.  Patient was started on Vesicare for her urinary retention symptoms.  Patient had a negative ultrasound of the kidneys for her AKI. 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   Most recent echo in March 2024: IMPRESSIONS 1. Left ventricular ejection fraction, by  estimation, is 55 to 60% . The left ventricle has normal function. The left ventricle has no regional wall motion abnormalities. Left ventricular diastolic parameters were normal. 2. Right ventricular systolic function is normal. The right ventricular size is normal. 3. Left atrial size was mildly dilated. 4. The mitral valve is normal in structure. Trivial mitral valve regurgitation. No evidence of mitral stenosis. 5. The aortic valve is normal in structure. Aortic valve regurgitation is not visualized. No aortic stenosis is present. 6. The inferior vena cava is normal in size with greater than 50% respiratory variability, suggesting right atrial pressure of 3 mmHg.  Past Medical History:  Diagnosis Date   Acute metabolic encephalopathy 0000000   AMS (altered mental status) 05/22/2022   Anxiety    Arthritis    Phreesia 02/08/2020   Asthma    mild intermittent   Asthma    Phreesia 02/08/2020   Bipolar 1 disorder ([place])    ect treatments last treatment Sep 02 1011   Depression    Depression    Phreesia 02/08/2020   Depression    Phreesia 07/10/2020   GERD (gastroesophageal reflux disease)    Headache    Hypertension    Pre-diabetes    Seizures ([place])    Thinks it was from taking Tramadol   Septic shock ([place]) 05/22/2022   Shortness of breath 09/23/2016   Overview:   exertional   Sleep apnea    AHI 40 (2024)   Substance abuse ([place])    Phreesia 02/08/2020   Syncope 08/26/2019   Syncope and collapse    Review of Systems  Genitourinary:  Positive for dysuria.  Neurological:  Positive for weakness.  All other systems reviewed and are negative.  Allergies  Allergen Reactions   Tetracyclines & Related Other (See Comments)    Syncope and put her "in a coma"   Tramadol Other (See Comments)    Seizures   Oxybutynin Other (See Comments)    Unknown reaction per Mom   Phenazopyridine Other (See Comments)    Unknown reaction   Ciprofloxacin Rash and Itching   Codeine Itching and  Rash   Estradiol Rash    Patches broke out the skin   Past Surgical History:  Procedure Laterality Date   ABDOMINAL HYSTERECTOMY     AMPUTATION TOE Left 02/02/2018   Procedure: AMPUTATION TOE Left 4th toe;  Surgeon: [name], DPM;  Location: [facility];  Service: Podiatry;  Laterality: Left;   APPENDECTOMY N/A    Phreesia 02/08/2020   BACK SURGERY  2018   ACDF   C5-6 & C6-7 by Dr. Rennis Harding   CARPAL TUNNEL RELEASE     x2   I & D EXTREMITY Right 05/22/2022   Procedure: IRRIGATION AND DEBRIDEMENT EXTREMITY;  Surgeon: Felipa Furnace, DPM;  Location: [facility];  Service: Podiatry;  Laterality: Right;   INCISION AND DRAINAGE Right 05/24/2022   Procedure: RIGHT REVISION INCISION AND DRAINAGE/ WASHOUT WITH PRIMARY DELAYED CLOSURE;  Surgeon: [name], DPM;  Location: [facility];  Service: Podiatry;  Laterality: Right;   IRRIGATION AND DEBRIDEMENT ABSCESS Right 10/25/2020   Procedure: IRRIGATION AND DEBRIDEMENT ABSCESS OF FOOT AND APPLICATION OF GRAFT;  Surgeon: [name], DPM;  Location: [facility];  Service: Podiatry;  Laterality: Right;   Laproscopic knee surgery Right    NECK SURGERY  03/15/2017   ACDF   by Dr. Patrice Paradise   PLANTAR FASCIA RELEASE     x2   REVERSE SHOULDER ARTHROPLASTY Right 04/14/2022   Procedure: REVERSE SHOULDER ARTHROPLASTY;  Surgeon: Hiram Gash, MD;  Location: [facility];  Service: Orthopedics;  Laterality: Right;   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   Procedure: TRANSMETATARSAL AMPUTATION;  Surgeon: [name], DPM;  Location: [facility];  Service: Podiatry;  Laterality: Right;   TRANSMETATARSAL AMPUTATION Right 05/27/2022   Procedure: REVISION TRANSMETATARSAL AMPUTATION WITH DELAYED PRIMARY CLOSURE;  Surgeon: [name], DPM;  Location: [facility];  Service: Podiatry;  Laterality: Right;   MEDICATIONS: Prior to Admission medications   Medication Sig Start Date End Date Taking? Authorizing Provider  albuterol (PROAIR HFA) 108 (90 Base) MCG/ACT  inhaler Inhale 2 puffs = 119mcg into the lungs every 6 (six) hours as needed for wheezing or shortness of breath. Patient taking differently: Inhale 2 puffs into the lungs every 6 (six) hours as needed for wheezing or shortness of breath. 07/11/20   Eulogio Bear, NP  benzonatate (TESSALON) 200 MG capsule Take 1 capsule (200 mg total) by mouth 3 (three) times daily as needed for cough. 07/12/22   Hosie Poisson, MD  busPIRone (BUSPAR) 15 MG tablet Take 15 mg by mouth 3 (three) times daily.  04/26/17   [provider]  Calcium Carbonate-Vit D-Min (CALCIUM 1200 PO) Take 1,200 mg by mouth daily.     [provider]  DULoxetine (CYMBALTA) 20 MG capsule Take 1 capsule (20 mg total) by mouth daily. 06/15/22 07/15/22  Sidney Ace, MD  Erenumab-aooe (AIMOVIG) 70 MG/ML SOAJ administer 93mL UNDER THE SKIN every 30 DAYS Patient taking differently: Inject 70 mg into the skin every 30 (thirty) days. 02/08/22   Jenna Luo  T, MD  escitalopram (LEXAPRO) 10 MG tablet Take 10 mg by mouth daily. 08/23/19   [provider]  estradiol (ESTRACE) 1 MG tablet Take 1 tablet (1 mg total) by mouth at bedtime. 07/27/21   [name], MD  famotidine (PEPCID) 40 MG tablet TAKE ONE TABLET BY MOUTH EVERYDAY AT BEDTIME Patient taking differently: Take 40 mg by mouth at bedtime. 04/20/22   [name], MD  feeding supplement (ENSURE ENLIVE / ENSURE PLUS) LIQD Take 237 mLs by mouth 2 (two) times daily between meals. 07/12/22 10/10/22  Hosie Poisson, MD  furosemide (LASIX) 40 MG tablet Take 1 tablet (40 mg total) by mouth daily. 06/18/22   [name], MD  guaiFENesin-dextromethorphan (ROBITUSSIN DM) 100-10 MG/5ML syrup Take 5-10 mLs by mouth every 4 (four) hours as needed for cough. 07/12/22   Hosie Poisson, MD  haloperidol (HALDOL) 2 MG tablet Take 1 tablet (2 mg total) by mouth at bedtime as needed for agitation (pyschosis). 07/12/22   Hosie Poisson, MD  Iron, Ferrous Sulfate, 325 (65 Fe)  MG TABS Take 325 mg by mouth in the morning and at bedtime. 06/22/22   [name], MD  lamoTRIgine (LAMICTAL) 100 MG tablet Take 100 mg by mouth daily. 08/06/21   [provider]  levocetirizine (XYZAL) 5 MG tablet Take 5 mg by mouth every evening. 06/28/22   [provider]  losartan (COZAAR) 50 MG tablet Take 1 tablet (50 mg total) by mouth daily. 06/27/22 09/25/22  Pokhrel, Corrie Mckusick, MD  Multiple Vitamins-Minerals (MULTIVITAMIN WITH MINERALS) tablet Take 1 tablet by mouth daily.    [provider]  naloxone [facility]) nasal spray 4 mg/0.1 mL Place 0.4 mg into the nose once. 08/03/21   [provider]  pantoprazole (PROTONIX) 40 MG tablet Take 1 tablet (40 mg total) by mouth 2 (two) times daily. 02/02/22   [name], MD  polyethylene glycol (MIRALAX / GLYCOLAX) 17 g packet Take 17 g by mouth daily. 07/13/22   Hosie Poisson, MD  pregabalin (LYRICA) 100 MG capsule Take 1 capsule (100 mg total) by mouth 2 (two) times daily. Patient taking differently: Take 100 mg by mouth 3 (three) times daily. 07/27/21   [name], MD  RELISTOR 150 MG TABS Take 450 mg by mouth every morning. 02/04/22   [provider]  solifenacin (VESICARE) 10 MG tablet TAKE ONE TABLET BY MOUTH DAILY 04/26/22   [name], MD  spironolactone (ALDACTONE) 25 MG tablet Take 1 tablet (25 mg total) by mouth daily. 06/27/22 09/25/22  Pokhrel, Corrie Mckusick, MD  topiramate (TOPAMAX) 50 MG tablet TAKE ONE TABLET BY MOUTH TWICE DAILY 04/20/22   [name], MD  traZODone (DESYREL) 100 MG tablet Take 1 tablet (100 mg total) by mouth at bedtime. 07/12/22   Hosie Poisson, MD      cefTRIAXone (ROCEPHIN)  IV Stopped (07/18/22 2044)    ED Course: Pt in Ed alert, awake, oriented, febrile fever of 100.2. Vitals:   07/18/22 1930 07/18/22 2000 07/18/22 2037 07/18/22 2100  BP: (!) 125/54 126/65  (!) 106/56  Pulse: 97 94  86  Resp: 14 17  15   Temp:   100.2 F (37.9 C)   TempSrc:   Oral    SpO2: 98% 97%  99%  Weight:      Height:       Total I/O In: -  Out: 150 [Urine:150] SpO2: 99 % Blood work in ed shows: Hyponatremia with a sodium of 131,  glucose 108, CKD with a creatinine of 1.28 and EGFR 49. Normal LFTs.  Lactic acid normal x 2, procalcitonin 0.17, CBC shows leukocytosis of 12.7 hemoglobin of 8.6, normal platelets of 260.  Chart review shows patient has a history of chronic anemia. Patient is given Rocephin and IV fluids in the emergency room. Results for orders placed or performed during the hospital encounter of 07/18/22 (from the past 72 hour(s))  Lactic acid, plasma     Status: None   Collection Time: 07/18/22  5:33 PM  Result Value Ref Range   Lactic Acid, Venous 1.2 0.5 - 1.9 mmol/L    Comment: Performed at [facility], [address], [place], [place] [address]  Comprehensive metabolic panel     Status: Abnormal   Collection Time: 07/18/22  5:33 PM  Result Value Ref Range   Sodium 131 (L) 135 - 145 mmol/L   Potassium 3.9 3.5 - 5.1 mmol/L   Chloride 102 98 - 111 mmol/L   CO2 21 (L) 22 - 32 mmol/L   Glucose, Bld 108 (H) 70 - 99 mg/dL    Comment: Glucose reference range applies only to samples taken after fasting for at least 8 hours.   BUN 26 (H) 6 - 20 mg/dL   Creatinine, Ser 1.28 (H) 0.44 - 1.00 mg/dL   Calcium 8.8 (L) 8.9 - 10.3 mg/dL   Total Protein 7.2 6.5 - 8.1 g/dL   Albumin 3.3 (L) 3.5 - 5.0 g/dL   AST 18 15 - 41 U/L   ALT 12 0 - 44 U/L   Alkaline Phosphatase 87 38 - 126 U/L   Total Bilirubin 0.8 0.3 - 1.2 mg/dL   GFR, Estimated 49 (L) >60 mL/min    Comment: (NOTE) Calculated using the CKD-EPI Creatinine Equation (2021)    Anion gap 8 5 - 15    Comment: Performed at [facility], [address] [place], [place] [address]  CBC with Differential     Status: Abnormal   Collection Time: 07/18/22  5:33 PM  Result Value Ref Range   WBC 12.7 (H) 4.0 - 10.5 K/uL   RBC 2.80 (L) 3.87 - 5.11 MIL/uL   Hemoglobin 8.6 (L) 12.0  - 15.0 g/dL   HCT 26.1 (L) 36.0 - 46.0 %   MCV 93.2 80.0 - 100.0 fL   MCH 30.7 26.0 - 34.0 pg   MCHC 33.0 30.0 - 36.0 g/dL   RDW 13.2 11.5 - 15.5 %   Platelets 260 150 - 400 K/uL   nRBC 0.0 0.0 - 0.2 %   Neutrophils Relative % 79 %   Neutro Abs 10.0 (H) 1.7 - 7.7 K/uL   Lymphocytes Relative 12 %   Lymphs Abs 1.5 0.7 - 4.0 K/uL   Monocytes Relative 8 %   Monocytes Absolute 1.0 0.1 - 1.0 K/uL   Eosinophils Relative 0 %   Eosinophils Absolute 0.0 0.0 - 0.5 K/uL   Basophils Relative 0 %   Basophils Absolute 0.0 0.0 - 0.1 K/uL   Immature Granulocytes 1 %   Abs Immature Granulocytes 0.09 (H) 0.00 - 0.07 K/uL    Comment: Performed at [facility], [place], [place] [address]  Troponin I (High Sensitivity)     Status: None   Collection Time: 07/18/22  5:33 PM  Result Value Ref Range   Troponin I (High Sensitivity) 4 <18 ng/L    Comment: (NOTE) Elevated high sensitivity troponin I (hsTnI) values and significant  changes across  serial measurements may suggest ACS but many other  chronic and acute conditions are known to elevate hsTnI results.  Refer to the "Links" section for chest pain algorithms and additional  guidance. Performed at [facility], [address] [place], [place] [address]   Procalcitonin     Status: None   Collection Time: 07/18/22  5:50 PM  Result Value Ref Range   Procalcitonin 0.17 ng/mL    Comment:        Interpretation: PCT (Procalcitonin) <= 0.5 ng/mL: Systemic infection (sepsis) is not likely. Local bacterial infection is possible. (NOTE)       Sepsis PCT Algorithm           Lower Respiratory Tract                                      Infection PCT Algorithm    ----------------------------     ----------------------------         PCT < 0.25 ng/mL                PCT < 0.10 ng/mL          Strongly encourage             Strongly discourage   discontinuation of antibiotics    initiation of antibiotics     ----------------------------     -----------------------------       PCT 0.25 - 0.50 ng/mL            PCT 0.10 - 0.25 ng/mL               OR       >80% decrease in PCT            Discourage initiation of                                            antibiotics      Encourage discontinuation           of antibiotics    ----------------------------     -----------------------------         PCT >= 0.50 ng/mL              PCT 0.26 - 0.50 ng/mL               AND        <80% decrease in PCT             Encourage initiation of                                             antibiotics       Encourage continuation           of antibiotics    ----------------------------     -----------------------------        PCT >= 0.50 ng/mL                  PCT > 0.50 ng/mL               AND         increase in PCT  Strongly encourage                                      initiation of antibiotics    Strongly encourage escalation           of antibiotics                                     -----------------------------                                           PCT <= 0.25 ng/mL                                                 OR                                        > 80% decrease in PCT                                      Discontinue / Do not initiate                                             antibiotics  Performed at [facility], [address] [place], [place] [address]   Urinalysis, Routine w reflex microscopic -Urine, Clean Catch     Status: Abnormal   Collection Time: 07/18/22  7:12 PM  Result Value Ref Range   Color, Urine YELLOW (A) YELLOW   APPearance CLOUDY (A) CLEAR   Specific Gravity, Urine 1.017 1.005 - 1.030   pH 5.0 5.0 - 8.0   Glucose, UA NEGATIVE NEGATIVE mg/dL   Hgb urine dipstick LARGE (A) NEGATIVE   Bilirubin Urine NEGATIVE NEGATIVE   Ketones, ur NEGATIVE NEGATIVE mg/dL   Protein, ur >=300 (A) NEGATIVE mg/dL   Nitrite NEGATIVE NEGATIVE    Leukocytes,Ua MODERATE (A) NEGATIVE   RBC / HPF >50 0 - 5 RBC/hpf   WBC, UA >50 0 - 5 WBC/hpf   Bacteria, UA MANY (A) NONE SEEN   Squamous Epithelial / HPF 6-10 0 - 5 /HPF   WBC Clumps PRESENT    Mucus PRESENT     Comment: Performed at [facility], [address], [place], [place] [address]  Resp panel by RT-PCR (RSV, Flu A&B, Covid) Urine, Clean Catch     Status: None   Collection Time: 07/18/22  7:12 PM   Specimen: Urine, Clean Catch; Nasal Swab  Result Value Ref Range   SARS Coronavirus 2 by RT PCR NEGATIVE NEGATIVE    Comment: (NOTE) SARS-CoV-2 target nucleic acids are NOT DETECTED.  The SARS-CoV-2 RNA is generally detectable in upper respiratory specimens during the acute phase of infection. The lowest concentration of SARS-CoV-2 viral copies this assay can detect is 138 copies/mL. A  negative result does not preclude SARS-Cov-2 infection and should not be used as the sole basis for treatment or other patient management decisions. A negative result may occur with  improper specimen collection/handling, submission of specimen other than nasopharyngeal swab, presence of viral mutation(s) within the areas targeted by this assay, and inadequate number of viral copies(<138 copies/mL). A negative result must be combined with clinical observations, patient history, and epidemiological information. The expected result is Negative.  Fact Sheet for Patients:  EntrepreneurPulse.com.au  Fact Sheet for Healthcare Providers:  IncredibleEmployment.be  This test is no t yet approved or cleared by the [place] FDA and  has been authorized for detection and/or diagnosis of SARS-CoV-2 by FDA under an Emergency Use Authorization (EUA). This EUA will remain  in effect (meaning this test can be used) for the duration of the COVID-19 declaration under Section 564(b)(1) of the Act, 21 U.S.C.section 360bbb-3(b)(1), unless the authorization is  terminated  or revoked sooner.       Influenza A by PCR NEGATIVE NEGATIVE   Influenza B by PCR NEGATIVE NEGATIVE    Comment: (NOTE) The Xpert Xpress SARS-CoV-2/FLU/RSV plus assay is intended as an aid in the diagnosis of influenza from Nasopharyngeal swab specimens and should not be used as a sole basis for treatment. Nasal washings and aspirates are unacceptable for Xpert Xpress SARS-CoV-2/FLU/RSV testing.  Fact Sheet for Patients: EntrepreneurPulse.com.au  Fact Sheet for Healthcare Providers: IncredibleEmployment.be  This test is not yet approved or cleared by the [place] FDA and has been authorized for detection and/or diagnosis of SARS-CoV-2 by FDA under an Emergency Use Authorization (EUA). This EUA will remain in effect (meaning this test can be used) for the duration of the COVID-19 declaration under Section 564(b)(1) of the Act, 21 U.S.C. section 360bbb-3(b)(1), unless the authorization is terminated or revoked.     Resp Syncytial Virus by PCR NEGATIVE NEGATIVE    Comment: (NOTE) Fact Sheet for Patients: EntrepreneurPulse.com.au  Fact Sheet for Healthcare Providers: IncredibleEmployment.be  This test is not yet approved or cleared by the [place] FDA and has been authorized for detection and/or diagnosis of SARS-CoV-2 by FDA under an Emergency Use Authorization (EUA). This EUA will remain in effect (meaning this test can be used) for the duration of the COVID-19 declaration under Section 564(b)(1) of the Act, 21 U.S.C. section 360bbb-3(b)(1), unless the authorization is terminated or revoked.  Performed at [facility], [address] [place], [place] [address]   POC urine preg, ED     Status: None   Collection Time: 07/18/22  7:15 PM  Result Value Ref Range   Preg Test, Ur Negative Negative  Lactic acid, plasma     Status: None   Collection Time: 07/18/22  7:52 PM   Result Value Ref Range   Lactic Acid, Venous 1.6 0.5 - 1.9 mmol/L    Comment: Performed at [facility], [address] [place], [place] [address]   *Note: Due to a large number of results and/or encounters for the requested time period, some results have not been displayed. A complete set of results can be found in Results Review.    Lab Results  Component Value Date   CREATININE 1.28 (H) 07/18/2022   CREATININE 1.27 (H) 07/11/2022   CREATININE 1.50 (H) 07/10/2022      Latest Ref Rng & Units 07/18/2022    5:33 PM 07/11/2022   12:08 PM 07/10/2022   10:53 AM  CMP  Glucose 70 - 99 mg/dL 108  120  141   BUN 6 - 20 mg/dL 26  32  24   Creatinine 0.44 - 1.00 mg/dL 1.28  1.27  1.50   Sodium 135 - 145 mmol/L 131  136  131   Potassium 3.5 - 5.1 mmol/L 3.9  5.0  4.2   Chloride 98 - 111 mmol/L 102  102  103   CO2 22 - 32 mmol/L 21  25  23    Calcium 8.9 - 10.3 mg/dL 8.8  9.3  8.7   Total Protein 6.5 - 8.1 g/dL 7.2     Total Bilirubin 0.3 - 1.2 mg/dL 0.8     Alkaline Phos 38 - 126 U/L 87     AST 15 - 41 U/L 18     ALT 0 - 44 U/L 12      Unresulted Labs (From admission, onward)     Start     Ordered   07/18/22 2002  Urine Culture  Add-on,   AD       Question:  Indication  Answer:  Dysuria   07/18/22 2001   07/18/22 1836  Blood culture (routine x 2)  BLOOD CULTURE X 2,   STAT      07/18/22 1836           Pt has received : Orders Placed This Encounter  Procedures   Critical Care    This order was created via procedure documentation    Standing Status:   Standing    Number of Occurrences:   1   Resp panel by RT-PCR (RSV, Flu A&B, Covid) Anterior Nasal Swab    Standing Status:   Standing    Number of Occurrences:   1   Blood culture (routine x 2)    Standing Status:   Standing    Number of Occurrences:   2   Urine Culture    Standing Status:   Standing    Number of Occurrences:   1    Order Specific Question:   Indication    Answer:   Dysuria   DG Chest 2 View     Standing Status:   Standing    Number of Occurrences:   1    Order Specific Question:   Reason for Exam (SYMPTOM  OR DIAGNOSIS REQUIRED)    Answer:   cough   CT ABDOMEN PELVIS W CONTRAST    Standing Status:   Standing    Number of Occurrences:   1    Order Specific Question:   Does the patient have a contrast media/X-ray dye allergy?    Answer:   No    Order Specific Question:   If indicated for the ordered procedure, I authorize the administration of contrast media per Radiology protocol    Answer:   Yes    Order Specific Question:   Is Oral Contrast requested for this exam?    Answer:   No oral contrast    Order Specific Question:   Reason for No Oral Contrast    Answer:   NPO   Lactic acid, plasma    Standing Status:   Standing    Number of Occurrences:   2   Comprehensive metabolic panel    Standing Status:   Standing    Number of Occurrences:   1   CBC with Differential    Standing Status:   Standing    Number of Occurrences:   1   Urinalysis, Routine w reflex microscopic -Urine, Clean Catch  Standing Status:   Standing    Number of Occurrences:   1    Order Specific Question:   Specimen Source    Answer:   Urine, Clean Catch [76]   Lactic acid, plasma    Standing Status:   Standing    Number of Occurrences:   1   Procalcitonin    Standing Status:   Standing    Number of Occurrences:   1   DO NOT delay antibiotics if unable to obtain blood culture.    Standing Status:   Standing    Number of Occurrences:   1   Code Sepsis activation.  This occurs automatically when order is signed and prioritizes pharmacy, lab, and radiology services for STAT collections and interventions.  If [facility] downtime, call [facility] [phone]) to activate Code Sepsis.    Standing Status:   Standing    Number of Occurrences:   1    Order Specific Question:   Initiate "Code Sepsis" tracking    Answer:   Yes    Order Specific Question:   Contact eLink ([phone])    Answer:   No     Order Specific Question:   Reason for Consult?    Answer:   tracking   Consult to hospitalist    Standing Status:   Standing    Number of Occurrences:   1    Order Specific Question:   Place call to:    Answer:   [id number]    Order Specific Question:   Reason for Consult    Answer:   Admit    Order Specific Question:   Diagnosis/Clinical Info for Consult:    Answer:   uti, sepsis   POC urine preg, ED    Standing Status:   Standing    Number of Occurrences:   1   EKG 12-Lead    Standing Status:   Standing    Number of Occurrences:   1   Insert 2nd peripheral IV if not already present.    Standing Status:   Standing    Number of Occurrences:   20    Meds ordered this encounter  Medications   lactated ringers bolus 1,000 mL   acetaminophen (TYLENOL) tablet 1,000 mg   iohexol (OMNIPAQUE) 300 MG/ML solution 100 mL   cefTRIAXone (ROCEPHIN) 2 g in sodium chloride 0.9 % 100 mL IVPB    Order Specific Question:   Antibiotic Indication:    Answer:   UTI    Admission Imaging : CT ABDOMEN PELVIS W CONTRAST  Result Date: 07/18/2022 CLINICAL DATA:  Abdominal pain, acute, nonlocalized. UTI symptoms. Painful urination. EXAM: CT ABDOMEN AND PELVIS WITH CONTRAST TECHNIQUE: Multidetector CT imaging of the abdomen and pelvis was performed using the standard protocol following bolus administration of intravenous contrast. RADIATION DOSE REDUCTION: This exam was performed according to the departmental dose-optimization program which includes automated exposure control, adjustment of the mA and/or kV according to patient size and/or use of iterative reconstruction technique. CONTRAST:  111mL OMNIPAQUE IOHEXOL 300 MG/ML  SOLN COMPARISON:  06/11/2022 FINDINGS: Lower chest: Lung bases are clear. No pleural or pericardial fluid. Small hiatal hernia. Hepatobiliary: No focal liver lesion. No calcified gallstone. Resolution of previously seen ascites. Pancreas: Normal Spleen: Normal Adrenals/Urinary Tract: Adrenal  glands are normal. Kidneys are normal. No cyst, mass, stone or hydronephrosis. Bladder shows wraps mild wall thickening but no advanced finding. Stomach/Bowel: No small bowel abnormality. Clips in the right lower quadrant probably related to previous appendectomy.  No abnormal colon finding. Vascular/Lymphatic: Aortic atherosclerosis. No aneurysm. IVC is normal. No adenopathy. Reproductive: Previous hysterectomy.  No pelvic mass. Other: No free fluid or air presently. Resolution of previously seen anasarca. Musculoskeletal: Distant thoracolumbar spinal fusion. No acute bone finding. IMPRESSION: 1. No acute or significant finding. Resolution of previously seen ascites, pleural effusions and anasarca. 2. Small hiatal hernia. 3. Question mild bladder wall thickening. No definite or advanced bladder abnormality. 4. Aortic atherosclerosis. Aortic Atherosclerosis (ICD10-I70.0). Electronically Signed   By: Nelson Chimes M.D.   On: 07/18/2022 19:09   DG Chest 2 View  Result Date: 07/18/2022 CLINICAL DATA:  Flu like EXAM: CHEST - 2 VIEW COMPARISON:  Chest radiograph dated 07/09/2022 FINDINGS: Normal lung volumes. No focal consolidations. No pleural effusion or pneumothorax. The heart size and mediastinal contours are within normal limits. Partially imaged thoracolumbar spinal fixation hardware appears intact. Cervical spinal fixation hardware appears intact. IMPRESSION: No active cardiopulmonary disease. Electronically Signed   By: Darrin Nipper M.D.   On: 07/18/2022 18:07   Physical Examination: Vitals:   07/18/22 1930 07/18/22 2000 07/18/22 2037 07/18/22 2100  BP: (!) 125/54 126/65  (!) 106/56  Pulse: 97 94  86  Temp:   100.2 F (37.9 C)   Resp: 14 17  15   Height:      Weight:      SpO2: 98% 97%  99%  TempSrc:   Oral   BMI (Calculated):       Physical Exam Vitals and nursing note reviewed.  Constitutional:      General: She is not in acute distress.    Appearance: She is obese. She is not ill-appearing,  toxic-appearing or diaphoretic.  HENT:     Head: Normocephalic and atraumatic.     Right Ear: Hearing and external ear normal.     Left Ear: Hearing and external ear normal.     Nose: Nose normal. No nasal deformity.     Mouth/Throat:     Lips: Pink.     Mouth: Mucous membranes are moist.     Tongue: No lesions.     Pharynx: Oropharynx is clear.  Eyes:     Extraocular Movements: Extraocular movements intact.  Cardiovascular:     Rate and Rhythm: Normal rate and regular rhythm.     Heart sounds: Normal heart sounds.  Pulmonary:     Effort: Pulmonary effort is normal.     Breath sounds: Normal breath sounds.  Abdominal:     General: Bowel sounds are normal. There is no distension.     Palpations: Abdomen is soft. There is no mass.     Tenderness: There is no abdominal tenderness. There is no guarding.     Hernia: No hernia is present.  Musculoskeletal:     Right lower leg: No edema.     Left lower leg: No edema.  Skin:    General: Skin is warm.  Neurological:     General: No focal deficit present.     Mental Status: She is alert and oriented to person, place, and time.     Cranial Nerves: Cranial nerves 2-12 are intact.     Motor: Motor function is intact.  Psychiatric:        Attention and Perception: Attention normal.        Mood and Affect: Mood normal.        Speech: Speech normal.        Behavior: Behavior normal. Behavior is cooperative.  Cognition and Memory: Cognition normal.     Assessment and Plan: * Dysuria Pt presenting with dysuria and not feeling well for past few days.  Urinalysis    Component Value Date/Time   COLORURINE YELLOW (A) 07/18/2022 1912   APPEARANCEUR CLOUDY (A) 07/18/2022 1912   LABSPEC 1.017 07/18/2022 1912   PHURINE 5.0 07/18/2022 1912   GLUCOSEU NEGATIVE 07/18/2022 1912   HGBUR LARGE (A) 07/18/2022 1912   BILIRUBINUR NEGATIVE 07/18/2022 1912   KETONESUR NEGATIVE 07/18/2022 1912   PROTEINUR >=300 (A) 07/18/2022 1912    UROBILINOGEN 0.2 06/23/2017 1108   NITRITE NEGATIVE 07/18/2022 1912   LEUKOCYTESUR MODERATE (A) 07/18/2022 1912  We will cont rocephin , follow C/S.  Sepsis secondary to UTI [facility]) Supportive care and tylenol. MIVF.  Will check for any underlying diabetes.    Chronic diastolic CHF (congestive heart failure) ([name]) Stable Strict I/o. Weights daily.  Heart healthy diet.    Anemia Pt has h/o chronic anemia.  Type/ screen.  IV PPI. FOBT.  CKD (chronic kidney disease) stage 2, GFR 60-89 ml/min Hold BP meds and ACEI.  Lab Results  Component Value Date   CREATININE 1.28 (H) 07/18/2022   CREATININE 1.27 (H) 07/11/2022   CREATININE 1.50 (H) 07/10/2022     Sleep apnea CPAP per home settings.   Nausea IV ppi,. IV Zofran.   Essential hypertension Vitals:   07/18/22 1729 07/18/22 1800 07/18/22 1930 07/18/22 2000  BP: 127/70 (!) 143/76 (!) 125/54 126/65   07/18/22 2100  BP: (!) 106/56  Stable.  We will gold diuretic. Hold aldactone. Hold ACEI  GERD (gastroesophageal reflux disease) Again IV PPI.    DVT prophylaxis:  Heparin.    Code Status:  Full Code.     07/18/2022    5:32 PM  Advanced Directives  Does Patient Have a Medical Advance Directive? No  Would patient like information on creating a medical advance directive? No - Patient declined    Family Communication:  None.   Emergency Contact: Contact Information     Name Relation Home Work [place] Mother [phone]  [phone]   Hermenia Fiscal   [phone]       Disposition Plan:  Home.   Consults: None.  Admission status: Inpatient.   Unit / Expected LOS: Med tele. / 2 days   Para Skeans MD [facility]  6 PM- 2 AM. [phone]( Pager ) Please use [email or web address] to contact the current [facility] MD  based on hours  and team and services or  You may call [phone] to contact current Assigned [facility] Attending/Consulting MD for this patient.  This number is  the [facility] ADMIT/Consult system  and will be able to assist any patient in any [place] location .

## 2022-07-18 NOTE — ED Notes (Signed)
Lab at the bedside for [facility] second set draw and repeat LA

## 2022-07-18 NOTE — Assessment & Plan Note (Addendum)
Stable Strict I/o. Weights daily.  Heart healthy diet.

## 2022-07-18 NOTE — ED Provider Notes (Addendum)
[facility] Provider Note    Event Date/Time   First MD Initiated Contact with Patient 07/18/22 1745     (approximate)   History   Urinary Frequency   HPI  [name] [name] is a 57 y.o. female past medical history of asthma hypertension bipolar disorder sleep apnea CKD chronic diastolic heart failure and chronic pain who presents because of urinary urgency dysuria cough and dyspnea.  Patient notes for about a week she has had significant burning with urination as well as bilateral upper back pain.  She feels like she is not urinating as much.  Over the last 3 days she has had cough reductive of green sputum and feels short of breath.  She is also had chest pressure for the last week which feels similar to prior episodes of chest pain she has had.  She also endorsing diffuse abdominal pain nausea but no vomiting no diarrhea normal bowel movements.  Not eating and drinking much.  Feels very weak.  Patient notes that her mom had influenza last week.    Past Medical History:  Diagnosis Date   Anxiety    Arthritis    Phreesia 02/08/2020   Asthma    mild intermittent   Asthma    Phreesia 02/08/2020   Bipolar 1 disorder ([place])    ect treatments last treatment Sep 02 1011   Depression    Depression    Phreesia 02/08/2020   Depression    Phreesia 07/10/2020   GERD (gastroesophageal reflux disease)    Headache    Hypertension    Pre-diabetes    Seizures ([place])    Thinks it was from taking Tramadol   Sleep apnea    AHI 40 (2024)   Substance abuse ([place])    Phreesia 02/08/2020    Patient Active Problem List   Diagnosis Date Noted   Acute encephalopathy 07/06/2022   Chronic diastolic CHF (congestive heart failure) ([place]) 07/06/2022   Bilateral leg edema 06/27/2022   Anemia [id number]   Acute metabolic encephalopathy 0000000   Right foot infection 05/27/2022   Morbid obesity ([place]) 05/26/2022   Cellulitis of right foot 05/22/2022   Severe sepsis ([address] [address])  05/22/2022   AMS (altered mental status) 05/22/2022   Septic shock ([place]) 05/22/2022   Abscess of right foot 05/22/2022   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   Diarrhea in adult patient    Weakness 06/30/2021   Gastroenteritis, infectious 06/30/2021   CKD (chronic kidney disease) stage 2, GFR 60-89 ml/min 06/30/2021   Hyponatremia 06/30/2021   Open wound of right foot, initial encounter 10/21/2020   Finger ulcer ([place]) 10/21/2020   Syncope 08/26/2019   Non-healing open wound of toe 05/15/2019   Osteomyelitis ([place]) 05/15/2019   Palpitations 10/26/2018   Foot ulcer ([place]) 01/31/2018   Hyperglycemia 01/31/2018   Left leg cellulitis 09/24/2017   Syncope and collapse    Near syncope 11/23/2016   Abdominal pain 09/23/2016   Anxiety 09/23/2016   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[place]) 09/23/2016   Constipation 09/23/2016   DDD (degenerative disc disease), lumbosacral 09/23/2016   Migraines 09/23/2016   Myelopathy ([place]) 09/23/2016   Nausea 09/23/2016   Post laminectomy syndrome 09/23/2016   Pseudoarthrosis of lumbar spine 09/23/2016   Shortness of breath 09/23/2016   Sleep apnea 09/23/2016   Spinal stenosis of lumbar region 09/23/2016   Vertigo 09/23/2016   Hallux malleus 11/15/2015   Chronic pain syndrome    GERD (gastroesophageal reflux disease)  Depression    Rash 11/14/2015   Toe ulcer, right ([place]) 04/11/2015   Cellulitis of toe of right foot 04/11/2015   Nonspecific chest pain    Chest pain 10/26/2014   Obesity (BMI 30-39.9) 10/26/2014   Hypokalemia 10/26/2014   Manic bipolar I disorder ([place]) 10/26/2014   Atypical angina 10/26/2014   Tardive dyskinesia 08/26/2011   Manic bipolar I disorder with rapid cycling ([place]) 05/18/2011    Class: Acute   Asthma 02/24/2011   Essential hypertension 02/24/2011   History of migraine headaches 02/24/2011     Physical Exam  Triage Vital Signs: ED Triage Vitals  Enc Vitals Group     BP 07/18/22  1729 127/70     Pulse Rate 07/18/22 1729 86     Resp 07/18/22 1729 20     Temp 07/18/22 1729 99.9 F (37.7 C)     Temp Source 07/18/22 1729 Oral     SpO2 07/18/22 1729 96 %     Weight 07/18/22 1730 227 lb 1.2 oz (103 kg)     Height 07/18/22 1730 5\' 4"  (1.626 m)     Head Circumference --      Peak Flow --      Pain Score 07/18/22 1730 10     Pain Loc --      Pain Edu? --      Excl. in [place]? --     Most recent vital signs: Vitals:   07/18/22 1923 07/18/22 1930  BP:  (!) 125/54  Pulse:  97  Resp:  14  Temp:    SpO2: 100% 98%     General: Awake, no distress.  CV:  Good peripheral perfusion.  Resp:  Normal effort.  Abd:  Abdomen is obese, tenderness diffusely, + left CVA tenderness Neuro:             Awake, Alert, Oriented x 3  Other:     ED Results / Procedures / Treatments  Labs (all labs ordered are listed, but only abnormal results are displayed) Labs Reviewed  COMPREHENSIVE METABOLIC PANEL - Abnormal; Notable for the following components:      Result Value   Sodium 131 (*)    CO2 21 (*)    Glucose, Bld 108 (*)    BUN 26 (*)    Creatinine, Ser 1.28 (*)    Calcium 8.8 (*)    Albumin 3.3 (*)    GFR, Estimated 49 (*)    All other components within normal limits  CBC WITH DIFFERENTIAL/PLATELET - Abnormal; Notable for the following components:   WBC 12.7 (*)    RBC 2.80 (*)    Hemoglobin 8.6 (*)    HCT 26.1 (*)    Neutro Abs 10.0 (*)    Abs Immature Granulocytes 0.09 (*)    All other components within normal limits  URINALYSIS, ROUTINE W REFLEX MICROSCOPIC - Abnormal; Notable for the following components:   Color, Urine YELLOW (*)    APPearance CLOUDY (*)    Hgb urine dipstick LARGE (*)    Protein, ur >=300 (*)    Leukocytes,Ua MODERATE (*)    Bacteria, UA MANY (*)    All other components within normal limits  RESP PANEL BY RT-PCR (RSV, FLU A&B, COVID)  [id number]  CULTURE, BLOOD (ROUTINE X 2)  CULTURE, BLOOD (ROUTINE X 2)  URINE CULTURE  LACTIC ACID, PLASMA   PROCALCITONIN  LACTIC ACID, PLASMA  POC URINE PREG, ED  TROPONIN I (HIGH SENSITIVITY)     EKG  EKG interpretation performed by myself: NSR, nml axis, nml intervals, no acute ischemic changes    RADIOLOGY I reviewed and interpreted the CXR which does not show any acute cardiopulmonary process    PROCEDURES:  Critical Care performed: Yes, see critical care procedure note(s)  .Critical Care  Performed by: Rada Hay, MD Authorized by: Rada Hay, MD   Critical care provider statement:    Critical care time (minutes):  30   Critical care was time spent personally by me on the following activities:  Development of treatment plan with patient or surrogate, discussions with consultants, evaluation of patient's response to treatment, examination of patient, ordering and review of laboratory studies, ordering and review of radiographic studies, ordering and performing treatments and interventions, pulse oximetry, re-evaluation of patient's condition and review of old charts   The patient is on the cardiac monitor to evaluate for evidence of arrhythmia and/or significant heart rate changes.   MEDICATIONS ORDERED IN ED: Medications  cefTRIAXone (ROCEPHIN) 2 g in sodium chloride 0.9 % 100 mL IVPB (2 g Intravenous New Bag/Given 07/18/22 2013)  lactated ringers bolus 1,000 mL (1,000 mLs Intravenous New Bag/Given 07/18/22 1916)  acetaminophen (TYLENOL) tablet 1,000 mg (1,000 mg Oral Given 07/18/22 1916)  iohexol (OMNIPAQUE) 300 MG/ML solution 100 mL (100 mLs Intravenous Contrast Given 07/18/22 1842)     IMPRESSION / MDM / ASSESSMENT AND PLAN / ED COURSE  I reviewed the triage vital signs and the nursing notes.                              Patient's presentation is most consistent with acute complicated illness / injury requiring diagnostic workup.  Differential diagnosis includes, but is not limited to, sepsis secondary to UTI/pyelonephritis, pneumonia, asthma  exacerbation, intra-abdominal infection, viral illness including influenza,  Patient is a 57 year old female who presents because of urinary frequency and dysuria as well as cough fever and feeling generally unwell.  Her urinary symptoms started about a week ago and is associated with abdominal pain nausea and flank pain cough productive of green sputum and dyspnea have been going on for about 2 to 3 days.  She is febrile to 102.4 rectally satting okay other vitals are reassuring.  Patient looks like she feels uncomfortable but is nontoxic she is not wheezing abdomen is diffusely tender and she does have some left CVA tenderness.  I am concerned given her fever and urinary symptoms that she could be septic from Pyelo.  Given abdominal tenderness plan to obtain a CT of the abdomen pelvis labs including lactate cultures urinalysis and will test for COVID and flu given her influenza exposure.  CT of the abdomen pelvis is negative for acute abnormality.  Chest x-ray does not show evidence of pneumonia.  Urinalysis is consistent with UTI.  I have added on urine culture.  Given patient's general weakness and malaise and comorbidities I do feel that she would best be served to receive IV antibiotics in the hospital until she is feeling improved.  Will give Rocephin.      FINAL CLINICAL IMPRESSION(S) / ED DIAGNOSES   Final diagnoses:  Acute pyelonephritis  Sepsis, due to unspecified organism, unspecified whether acute organ dysfunction present [facility])     Rx / DC Orders   ED Discharge Orders     None        Note:  This document was prepared using Dragon voice recognition software and may include  unintentional dictation errors.   Rada Hay, MD 07/18/22 2002    Rada Hay, MD 07/18/22 2017

## 2022-07-18 NOTE — ED Triage Notes (Signed)
BIB [facility] from home for UTI symptoms plus flu like symptoms. Pt has had burning sensation and painful urination. Pt also has weakness and runny nose since wednesday. Nausea and vomiting. Pt also has headache and neck pain. Recent orthopedic surgery 2/12.  Pt meets criteria for CODE sepsis.   Pt was seen recently for same symptoms on 3/7 and 3/19.

## 2022-07-18 NOTE — Assessment & Plan Note (Signed)
Supportive care and tylenol. MIVF.  Will check for any underlying diabetes.

## 2022-07-18 NOTE — Assessment & Plan Note (Signed)
Pt presenting with dysuria and not feeling well for past few days.  Urinalysis    Component Value Date/Time   COLORURINE YELLOW (A) 07/18/2022 1912   APPEARANCEUR CLOUDY (A) 07/18/2022 1912   LABSPEC 1.017 07/18/2022 1912   PHURINE 5.0 07/18/2022 1912   GLUCOSEU NEGATIVE 07/18/2022 1912   HGBUR LARGE (A) 07/18/2022 1912   BILIRUBINUR NEGATIVE 07/18/2022 1912   KETONESUR NEGATIVE 07/18/2022 1912   PROTEINUR >=300 (A) 07/18/2022 1912   UROBILINOGEN 0.2 06/23/2017 1108   NITRITE NEGATIVE 07/18/2022 1912   LEUKOCYTESUR MODERATE (A) 07/18/2022 1912  We will cont rocephin , follow C/S.

## 2022-07-18 NOTE — Hospital Course (Addendum)
Poor historian.  Sepsis/ UTI. 1 week dysuria. Cough/ sob. Febrile 102. HR above 90 Lactic  BL Flank pain. CT : Bladder wall thickening.  MIVF.

## 2022-07-18 NOTE — Consult Note (Signed)
CODE SEPSIS - PHARMACY COMMUNICATION  **Broad Spectrum Antibiotics should be administered within 1 hour of Sepsis diagnosis**  Time Code Sepsis Called/Page Received: 2005  Antibiotics Ordered: ceftriaxone  Time of 1st antibiotic administration: 2013  Additional action taken by pharmacy: N/A   Lorin Picket ,PharmD Clinical Pharmacist  07/18/2022  8:02 PM

## 2022-07-18 NOTE — Assessment & Plan Note (Addendum)
Continue PPI

## 2022-07-18 NOTE — Assessment & Plan Note (Signed)
CPAP.  

## 2022-07-19 ENCOUNTER — Inpatient Hospital Stay: Payer: 59 | Admitting: Family Medicine

## 2022-07-19 ENCOUNTER — Encounter: Payer: Self-pay | Admitting: Internal Medicine

## 2022-07-19 DIAGNOSIS — E871 Hypo-osmolality and hyponatremia: Secondary | ICD-10-CM | POA: Diagnosis not present

## 2022-07-19 DIAGNOSIS — R7303 Prediabetes: Secondary | ICD-10-CM | POA: Diagnosis present

## 2022-07-19 DIAGNOSIS — N1831 Chronic kidney disease, stage 3a: Secondary | ICD-10-CM | POA: Diagnosis not present

## 2022-07-19 DIAGNOSIS — F419 Anxiety disorder, unspecified: Secondary | ICD-10-CM | POA: Diagnosis present

## 2022-07-19 DIAGNOSIS — R300 Dysuria: Secondary | ICD-10-CM | POA: Diagnosis present

## 2022-07-19 DIAGNOSIS — D638 Anemia in other chronic diseases classified elsewhere: Secondary | ICD-10-CM

## 2022-07-19 DIAGNOSIS — J45909 Unspecified asthma, uncomplicated: Secondary | ICD-10-CM | POA: Diagnosis present

## 2022-07-19 DIAGNOSIS — I9589 Other hypotension: Secondary | ICD-10-CM | POA: Diagnosis not present

## 2022-07-19 DIAGNOSIS — E876 Hypokalemia: Secondary | ICD-10-CM | POA: Diagnosis not present

## 2022-07-19 DIAGNOSIS — K219 Gastro-esophageal reflux disease without esophagitis: Secondary | ICD-10-CM

## 2022-07-19 DIAGNOSIS — N390 Urinary tract infection, site not specified: Secondary | ICD-10-CM | POA: Diagnosis not present

## 2022-07-19 DIAGNOSIS — I959 Hypotension, unspecified: Secondary | ICD-10-CM | POA: Diagnosis not present

## 2022-07-19 DIAGNOSIS — A419 Sepsis, unspecified organism: Secondary | ICD-10-CM

## 2022-07-19 DIAGNOSIS — N183 Chronic kidney disease, stage 3 unspecified: Secondary | ICD-10-CM

## 2022-07-19 DIAGNOSIS — Z1152 Encounter for screening for COVID-19: Secondary | ICD-10-CM | POA: Diagnosis not present

## 2022-07-19 DIAGNOSIS — N3001 Acute cystitis with hematuria: Secondary | ICD-10-CM | POA: Diagnosis not present

## 2022-07-19 DIAGNOSIS — Z79899 Other long term (current) drug therapy: Secondary | ICD-10-CM | POA: Diagnosis not present

## 2022-07-19 DIAGNOSIS — I5032 Chronic diastolic (congestive) heart failure: Secondary | ICD-10-CM | POA: Diagnosis not present

## 2022-07-19 DIAGNOSIS — N179 Acute kidney failure, unspecified: Secondary | ICD-10-CM | POA: Diagnosis not present

## 2022-07-19 DIAGNOSIS — M533 Sacrococcygeal disorders, not elsewhere classified: Secondary | ICD-10-CM | POA: Diagnosis not present

## 2022-07-19 DIAGNOSIS — F319 Bipolar disorder, unspecified: Secondary | ICD-10-CM | POA: Diagnosis present

## 2022-07-19 DIAGNOSIS — G4730 Sleep apnea, unspecified: Secondary | ICD-10-CM | POA: Diagnosis not present

## 2022-07-19 DIAGNOSIS — Z96611 Presence of right artificial shoulder joint: Secondary | ICD-10-CM | POA: Diagnosis present

## 2022-07-19 DIAGNOSIS — I130 Hypertensive heart and chronic kidney disease with heart failure and stage 1 through stage 4 chronic kidney disease, or unspecified chronic kidney disease: Secondary | ICD-10-CM | POA: Diagnosis present

## 2022-07-19 DIAGNOSIS — D631 Anemia in chronic kidney disease: Secondary | ICD-10-CM | POA: Diagnosis present

## 2022-07-19 DIAGNOSIS — Z881 Allergy status to other antibiotic agents status: Secondary | ICD-10-CM | POA: Diagnosis not present

## 2022-07-19 DIAGNOSIS — Z885 Allergy status to narcotic agent status: Secondary | ICD-10-CM | POA: Diagnosis not present

## 2022-07-19 DIAGNOSIS — N189 Chronic kidney disease, unspecified: Secondary | ICD-10-CM | POA: Diagnosis not present

## 2022-07-19 DIAGNOSIS — Z89422 Acquired absence of other left toe(s): Secondary | ICD-10-CM | POA: Diagnosis not present

## 2022-07-19 DIAGNOSIS — A4151 Sepsis due to Escherichia coli [E. coli]: Secondary | ICD-10-CM | POA: Diagnosis present

## 2022-07-19 DIAGNOSIS — R6520 Severe sepsis without septic shock: Secondary | ICD-10-CM | POA: Diagnosis present

## 2022-07-19 DIAGNOSIS — Z888 Allergy status to other drugs, medicaments and biological substances status: Secondary | ICD-10-CM | POA: Diagnosis not present

## 2022-07-19 LAB — COMPREHENSIVE METABOLIC PANEL
ALT: 12 U/L (ref 0–44)
AST: 16 U/L (ref 15–41)
Albumin: 3.2 g/dL — ABNORMAL LOW (ref 3.5–5.0)
Alkaline Phosphatase: 81 U/L (ref 38–126)
Anion gap: 10 (ref 5–15)
BUN: 29 mg/dL — ABNORMAL HIGH (ref 6–20)
CO2: 19 mmol/L — ABNORMAL LOW (ref 22–32)
Calcium: 8.5 mg/dL — ABNORMAL LOW (ref 8.9–10.3)
Chloride: 103 mmol/L (ref 98–111)
Creatinine, Ser: 1.44 mg/dL — ABNORMAL HIGH (ref 0.44–1.00)
GFR, Estimated: 42 mL/min — ABNORMAL LOW (ref >60)
Glucose, Bld: 116 mg/dL — ABNORMAL HIGH (ref 70–99)
Potassium: 3.4 mmol/L — ABNORMAL LOW (ref 3.5–5.1)
Sodium: 132 mmol/L — ABNORMAL LOW (ref 135–145)
Total Bilirubin: 0.8 mg/dL (ref 0.3–1.2)
Total Protein: 6.8 g/dL (ref 6.5–8.1)

## 2022-07-19 LAB — CBC
HCT: 25.3 % — ABNORMAL LOW (ref 36.0–46.0)
Hemoglobin: 8.4 g/dL — ABNORMAL LOW (ref 12.0–15.0)
MCH: 31.1 pg (ref 26.0–34.0)
MCHC: 33.2 g/dL (ref 30.0–36.0)
MCV: 93.7 fL (ref 80.0–100.0)
Platelets: 251 10*3/uL (ref 150–400)
RBC: 2.7 MIL/uL — ABNORMAL LOW (ref 3.87–5.11)
RDW: 13.4 % (ref 11.5–15.5)
WBC: 19.9 10*3/uL — ABNORMAL HIGH (ref 4.0–10.5)
nRBC: 0 % (ref 0.0–0.2)

## 2022-07-19 MED ORDER — LORATADINE 10 MG PO TABS
10.0000 mg | ORAL_TABLET | Freq: Every evening | ORAL | Status: DC
  Administered 2022-07-19 – 2022-07-22 (×4): 10 mg via ORAL
  Filled 2022-07-19 (×4): qty 1

## 2022-07-19 MED ORDER — LACTATED RINGERS IV SOLN: INTRAVENOUS | Status: DC

## 2022-07-19 MED ORDER — PREGABALIN 50 MG PO CAPS
100.0000 mg | ORAL_CAPSULE | Freq: Three times a day (TID) | ORAL | Status: DC
  Administered 2022-07-19 – 2022-07-23 (×13): 100 mg via ORAL
  Filled 2022-07-19 (×13): qty 2

## 2022-07-19 MED ORDER — GUAIFENESIN-DM 100-10 MG/5ML PO SYRP: 5.0000 mL | ORAL_SOLUTION | ORAL | Status: DC | PRN

## 2022-07-19 MED ORDER — FAMOTIDINE 20 MG PO TABS
20.0000 mg | ORAL_TABLET | Freq: Every day | ORAL | Status: DC
  Administered 2022-07-19 – 2022-07-22 (×4): 20 mg via ORAL
  Filled 2022-07-19 (×4): qty 1

## 2022-07-19 MED ORDER — OXYCODONE HCL 5 MG PO TABS
5.0000 mg | ORAL_TABLET | Freq: Four times a day (QID) | ORAL | Status: DC | PRN
  Administered 2022-07-19 – 2022-07-23 (×15): 5 mg via ORAL
  Filled 2022-07-19 (×15): qty 1

## 2022-07-19 MED ORDER — HALOPERIDOL 2 MG PO TABS: 2.0000 mg | ORAL_TABLET | Freq: Every evening | ORAL | Status: DC | PRN

## 2022-07-19 MED ORDER — OXYCODONE-ACETAMINOPHEN 5-325 MG PO TABS
1.0000 | ORAL_TABLET | Freq: Four times a day (QID) | ORAL | Status: DC | PRN
  Administered 2022-07-19 – 2022-07-23 (×14): 1 via ORAL
  Filled 2022-07-19 (×14): qty 1

## 2022-07-19 MED ORDER — ESCITALOPRAM OXALATE 10 MG PO TABS
10.0000 mg | ORAL_TABLET | Freq: Every day | ORAL | Status: DC
  Administered 2022-07-19 – 2022-07-23 (×5): 10 mg via ORAL
  Filled 2022-07-19 (×5): qty 1

## 2022-07-19 MED ORDER — POTASSIUM CHLORIDE CRYS ER 20 MEQ PO TBCR
40.0000 meq | EXTENDED_RELEASE_TABLET | Freq: Once | ORAL | Status: AC
  Administered 2022-07-19: 40 meq via ORAL
  Filled 2022-07-19: qty 2

## 2022-07-19 MED ORDER — OXYCODONE-ACETAMINOPHEN 10-325 MG PO TABS: 1.0000 | ORAL_TABLET | ORAL | Status: DC | PRN

## 2022-07-19 MED ORDER — ALBUTEROL SULFATE (2.5 MG/3ML) 0.083% IN NEBU: 3.0000 mL | INHALATION_SOLUTION | Freq: Four times a day (QID) | RESPIRATORY_TRACT | Status: DC | PRN

## 2022-07-19 MED ORDER — TRAZODONE HCL 100 MG PO TABS
200.0000 mg | ORAL_TABLET | Freq: Every evening | ORAL | Status: DC | PRN
  Administered 2022-07-20 – 2022-07-21 (×2): 200 mg via ORAL
  Filled 2022-07-19 (×2): qty 2

## 2022-07-19 MED ORDER — METHYLPREDNISOLONE SODIUM SUCC 40 MG IJ SOLR
40.0000 mg | Freq: Once | INTRAMUSCULAR | Status: AC
  Administered 2022-07-19: 40 mg via INTRAVENOUS
  Filled 2022-07-19: qty 1

## 2022-07-19 MED ORDER — BUSPIRONE HCL 10 MG PO TABS
15.0000 mg | ORAL_TABLET | Freq: Three times a day (TID) | ORAL | Status: DC
  Administered 2022-07-19 – 2022-07-23 (×13): 15 mg via ORAL
  Filled 2022-07-19 (×12): qty 2
  Filled 2022-07-19: qty 3
  Filled 2022-07-19: qty 2

## 2022-07-19 MED ORDER — TOPIRAMATE 25 MG PO TABS
50.0000 mg | ORAL_TABLET | Freq: Two times a day (BID) | ORAL | Status: DC
  Administered 2022-07-19 – 2022-07-23 (×9): 50 mg via ORAL
  Filled 2022-07-19 (×9): qty 2

## 2022-07-19 MED ORDER — ONDANSETRON HCL 4 MG/2ML IJ SOLN
4.0000 mg | Freq: Four times a day (QID) | INTRAMUSCULAR | Status: DC | PRN
  Administered 2022-07-19: 4 mg via INTRAVENOUS
  Filled 2022-07-19: qty 2

## 2022-07-19 MED ORDER — PANTOPRAZOLE SODIUM 40 MG IV SOLR
40.0000 mg | Freq: Two times a day (BID) | INTRAVENOUS | Status: DC
  Administered 2022-07-19 (×2): 40 mg via INTRAVENOUS
  Filled 2022-07-19 (×2): qty 10

## 2022-07-19 MED ORDER — FESOTERODINE FUMARATE ER 4 MG PO TB24
4.0000 mg | ORAL_TABLET | Freq: Every day | ORAL | Status: DC
  Administered 2022-07-19 – 2022-07-23 (×5): 4 mg via ORAL
  Filled 2022-07-19 (×5): qty 1

## 2022-07-19 MED ORDER — ESTRADIOL 0.5 MG PO TABS
1.0000 mg | ORAL_TABLET | Freq: Every day | ORAL | Status: DC
  Administered 2022-07-19 – 2022-07-20 (×2): 1 mg via ORAL
  Filled 2022-07-19 (×2): qty 2

## 2022-07-19 MED ORDER — ENSURE ENLIVE PO LIQD
237.0000 mL | Freq: Two times a day (BID) | ORAL | Status: DC
  Administered 2022-07-19 – 2022-07-23 (×9): 237 mL via ORAL

## 2022-07-19 MED ORDER — PANTOPRAZOLE SODIUM 40 MG PO TBEC
40.0000 mg | DELAYED_RELEASE_TABLET | Freq: Two times a day (BID) | ORAL | Status: DC
  Administered 2022-07-19 – 2022-07-23 (×8): 40 mg via ORAL
  Filled 2022-07-19 (×8): qty 1

## 2022-07-19 MED ORDER — LAMOTRIGINE 100 MG PO TABS
100.0000 mg | ORAL_TABLET | Freq: Every day | ORAL | Status: DC
  Administered 2022-07-19 – 2022-07-23 (×5): 100 mg via ORAL
  Filled 2022-07-19 (×5): qty 1

## 2022-07-19 MED ORDER — POLYETHYLENE GLYCOL 3350 17 G PO PACK
17.0000 g | PACK | Freq: Every day | ORAL | Status: DC
  Administered 2022-07-20 – 2022-07-23 (×4): 17 g via ORAL
  Filled 2022-07-19 (×5): qty 1

## 2022-07-19 NOTE — Progress Notes (Signed)
Spoke with Lavella Lemons in infection prevention and she says the patient does not need to be on contact isolation. MRSA swabbed performed within 30 days and it was negative.

## 2022-07-19 NOTE — Assessment & Plan Note (Signed)
Sodium now in normal range.

## 2022-07-19 NOTE — Assessment & Plan Note (Signed)
Urine culture only growing 20,000 E. coli.  Follow-up sensitivities.  Empirically on Rocephin.

## 2022-07-19 NOTE — Progress Notes (Signed)
Patient arrived to room 209 in NAD, VS stable. Patient oriented to room and call bell in reach. Family updated by patient. Patient has purse, cell phone, charger, clothes and right boot at bedside. Advised about personal belonging policy.

## 2022-07-19 NOTE — ED Notes (Signed)
Pt reports nausea, MD notified via secure chat, awaiting orders

## 2022-07-19 NOTE — Assessment & Plan Note (Deleted)
CKD stage IIIa.  Creatinine is slightly worse today 1.44 with a GFR 42.  Watch closely with IV fluid.

## 2022-07-19 NOTE — ED Notes (Signed)
Patient resting with eyes closed. NAD noted. Respirations even and unlabored. 

## 2022-07-19 NOTE — Telephone Encounter (Signed)
Requested Prescriptions  Pending Prescriptions Disp Refills   estradiol (ESTRACE) 1 MG tablet [Pharmacy Med Name: estradiol 1 mg tablet] 90 tablet 3    Sig: TAKE ONE TABLET BY MOUTH EVERYDAY AT BEDTIME     OB/GYN:  Estrogens Failed - 07/18/2022  8:00 AM      Failed - Valid encounter within last 12 months    Recent Outpatient Visits           10 months ago Benign essential HTN   [place] Pickard, Cammie Mcgee, MD   1 year ago Infectious diarrhea   [place] Dennard Schaumann, Cammie Mcgee, MD   1 year ago Blood in stool, frank   [place] Dennard Schaumann, Cammie Mcgee, MD   1 year ago Ulcer of right foot with fat layer exposed ([place])   [place] [name], MD   2 years ago Upper respiratory tract infection, unspecified type   [place] Eulogio Bear, NP              Passed - Mammogram is up-to-date per Health Maintenance      Passed - Last BP in normal range    BP Readings from Last 1 Encounters:  07/19/22 (!) 122/58

## 2022-07-19 NOTE — Progress Notes (Signed)
PT Cancellation Note  Patient Details Name: [name] [name] MRN: [id number] DOB: April 03, 1966   Cancelled Treatment:    Reason Eval/Treat Not Completed: Patient at procedure or test/unavailable.  Chart reviewed and attempted to see pt.  Pt currently being transferred off the floor.  Pt to be seen at later date/time as medically appropriate.   Gwenlyn Saran, PT, DPT Physical Therapist - [facility]  07/19/22, 2:59 PM

## 2022-07-19 NOTE — Assessment & Plan Note (Signed)
Will give 1 dose of Solu-Medrol and reassess tomorrow.

## 2022-07-19 NOTE — Assessment & Plan Note (Signed)
Replaced. °

## 2022-07-19 NOTE — Progress Notes (Addendum)
Progress Note   Patient: [name] [name] [id number] DOB: 07/07/1965 DOA: 07/18/2022     0 DOS: the patient was seen and examined on 07/19/2022   Brief hospital course: 57 year old female with anxiety, asthma, bipolar, depression, GERD, hypertension, prediabetes presents to the hospital with not feeling well.  She has some ear pain, burning on urination, left back pain and side pain, nausea, poor appetite and fever.  Admitted with sepsis.  4/1.  Patient still not feeling well.  Started on Rocephin for sepsis likely urinary source.  Assessment and Plan: * Sepsis secondary to UTI Present on admission, likely urinary source with positive urine analysis.  Follow-up blood and urine cultures.  Empirically on Rocephin.  Patient with leukocytosis of 19.9, temperature of 102.4.   Acute cystitis with hematuria Follow-up blood and urine cultures.  Empirically on Rocephin.  Pain of left sacroiliac joint Will give 1 dose of Solu-Medrol and reassess tomorrow.  Hypotension Holding antihypertensive medications.  Chronic diastolic CHF (congestive heart failure) No signs of heart failure currently watch closely with IV fluids.  Holding antihypertensive medication.   Chronic kidney disease, stage III (moderate) CKD stage IIIa.  Creatinine is slightly worse today 1.44 with a GFR 42.  Watch closely with IV fluid.  Anemia of chronic disease With ferritin being above 100 usually goes along with anemia of chronic disease.  GERD (gastroesophageal reflux disease) Continue PPI  Sleep apnea CPAP  Hyponatremia Sodium a few points lower than normal range.  Continue to monitor.  Hypokalemia Replace potassium orally.        Subjective: Patient not feeling well.  Has side and back pain, nausea, poor appetite, fever, ear pain.  Admitted with sepsis likely urinary source.  Has burning on urination.  Physical Exam: Vitals:   07/19/22 0515 07/19/22 0600 07/19/22 0700 07/19/22 0818  BP: 110/62  (!) 107/58 (!) 96/55 (!) 108/48  Pulse: (!) 103 92 83 80  Resp: (!) 21 18 19 17   Temp: (!) 100.7 F (38.2 C)   98.9 F (37.2 C)  TempSrc: Oral   Oral  SpO2: 96% 96% 95% 99%  Weight:      Height:       Physical Exam HENT:     Head: Normocephalic.     Mouth/Throat:     Pharynx: No oropharyngeal exudate.  Eyes:     General: Lids are normal.     Conjunctiva/sclera: Conjunctivae normal.  Cardiovascular:     Rate and Rhythm: Normal rate and regular rhythm.     Heart sounds: Normal heart sounds, S1 normal and S2 normal.  Pulmonary:     Breath sounds: Normal breath sounds. No decreased breath sounds, wheezing, rhonchi or rales.  Abdominal:     Palpations: Abdomen is soft.     Tenderness: There is abdominal tenderness in the suprapubic area and left lower quadrant.  Musculoskeletal:     Right lower leg: No swelling.     Left lower leg: No swelling.  Skin:    General: Skin is warm.     Findings: No rash.  Neurological:     Mental Status: She is alert and oriented to person, place, and time.     Data Reviewed: Sodium 132, potassium 3.4, creatinine 1.44, white blood cell count 19.9, hemoglobin 8.4, platelet count 251 CT scan of the abdomen pelvis did not show any acute or significant finding.  Questionable mild bladder wall thickening Urinalysis positive  Family Communication: Updated son on the phone  Disposition: Status is: Inpatient Remains  inpatient appropriate because: Treating for sepsis likely urinary source with IV antibiotics  Planned Discharge Destination: Home    Time spent: 30 minutes  Author: Loletha Grayer, MD 07/19/2022 12:07 PM  For on call review [email or web address].

## 2022-07-19 NOTE — Assessment & Plan Note (Addendum)
Holding antihypertensive medications.

## 2022-07-19 NOTE — Assessment & Plan Note (Signed)
With ferritin being above 100 usually goes along with anemia of chronic disease.

## 2022-07-20 DIAGNOSIS — I9589 Other hypotension: Secondary | ICD-10-CM

## 2022-07-20 DIAGNOSIS — N179 Acute kidney failure, unspecified: Secondary | ICD-10-CM

## 2022-07-20 DIAGNOSIS — E871 Hypo-osmolality and hyponatremia: Secondary | ICD-10-CM

## 2022-07-20 DIAGNOSIS — M533 Sacrococcygeal disorders, not elsewhere classified: Secondary | ICD-10-CM | POA: Diagnosis not present

## 2022-07-20 DIAGNOSIS — A419 Sepsis, unspecified organism: Secondary | ICD-10-CM | POA: Diagnosis not present

## 2022-07-20 DIAGNOSIS — N390 Urinary tract infection, site not specified: Secondary | ICD-10-CM

## 2022-07-20 DIAGNOSIS — N189 Chronic kidney disease, unspecified: Secondary | ICD-10-CM

## 2022-07-20 DIAGNOSIS — E876 Hypokalemia: Secondary | ICD-10-CM

## 2022-07-20 LAB — HEMOGLOBIN A1C
Hgb A1c MFr Bld: 5.4 % (ref 4.8–5.6)
Mean Plasma Glucose: 108 mg/dL

## 2022-07-20 LAB — CBC
HCT: 26.6 % — ABNORMAL LOW (ref 36.0–46.0)
Hemoglobin: 8.6 g/dL — ABNORMAL LOW (ref 12.0–15.0)
MCH: 30.4 pg (ref 26.0–34.0)
MCHC: 32.3 g/dL (ref 30.0–36.0)
MCV: 94 fL (ref 80.0–100.0)
Platelets: 226 10*3/uL (ref 150–400)
RBC: 2.83 MIL/uL — ABNORMAL LOW (ref 3.87–5.11)
RDW: 13.5 % (ref 11.5–15.5)
WBC: 15.5 10*3/uL — ABNORMAL HIGH (ref 4.0–10.5)
nRBC: 0 % (ref 0.0–0.2)

## 2022-07-20 LAB — BASIC METABOLIC PANEL WITH GFR
Anion gap: 9 (ref 5–15)
BUN: 37 mg/dL — ABNORMAL HIGH (ref 6–20)
CO2: 19 mmol/L — ABNORMAL LOW (ref 22–32)
Calcium: 8.7 mg/dL — ABNORMAL LOW (ref 8.9–10.3)
Chloride: 107 mmol/L (ref 98–111)
Creatinine, Ser: 1.67 mg/dL — ABNORMAL HIGH (ref 0.44–1.00)
GFR, Estimated: 35 mL/min — ABNORMAL LOW
Glucose, Bld: 121 mg/dL — ABNORMAL HIGH (ref 70–99)
Potassium: 4.3 mmol/L (ref 3.5–5.1)
Sodium: 135 mmol/L (ref 135–145)

## 2022-07-20 MED ORDER — ORAL CARE MOUTH RINSE: 15.0000 mL | OROMUCOSAL | Status: DC | PRN

## 2022-07-20 MED ORDER — SODIUM CHLORIDE 0.9 % IV SOLN: INTRAVENOUS | Status: DC

## 2022-07-20 MED ORDER — METHYLPREDNISOLONE SODIUM SUCC 40 MG IJ SOLR
40.0000 mg | Freq: Every day | INTRAMUSCULAR | Status: AC
  Administered 2022-07-20 – 2022-07-21 (×2): 40 mg via INTRAVENOUS
  Filled 2022-07-20 (×2): qty 1

## 2022-07-20 MED ORDER — SODIUM CHLORIDE 0.9 % IV BOLUS
250.0000 mL | Freq: Once | INTRAVENOUS | Status: AC
  Administered 2022-07-20: 250 mL via INTRAVENOUS

## 2022-07-20 NOTE — Progress Notes (Signed)
Progress Note   Patient: [name] [name] [id number] DOB: 04/13/1966 DOA: 07/18/2022     1 DOS: the patient was seen and examined on 07/20/2022   Brief hospital course: 57 year old female with anxiety, asthma, bipolar, depression, GERD, hypertension, prediabetes presents to the hospital with not feeling well.  She has some ear pain, burning on urination, left back pain and side pain, nausea, poor appetite and fever.  Admitted with sepsis.  4/1.  Patient still not feeling well.  Started on Rocephin for sepsis likely urinary source.  Solu-Medrol started for left sacroiliac pain. 4/2.  Blood cultures negative for 2 days.  Urine culture only growing out 20,000 E. coli.  Continue Rocephin.  Continue Solu-Medrol.  Assessment and Plan: * Sepsis secondary to UTI Present on admission, likely urinary source with positive urine analysis.  Blood cultures negative for 2 days.  Urine culture only growing out 20,000 E. coli.  Follow-up sensitivities..  Empirically on Rocephin.  Patient with leukocytosis of 19.9, temperature of 102.4 on presentation.  White blood cell count 15.5.   Acute cystitis with hematuria Urine culture only growing 20,000 E. coli.  Follow-up sensitivities.  Empirically on Rocephin.  Pain of left sacroiliac joint Continue Solu-Medrol  Hypotension Holding antihypertensive medications.  Started IV fluid hydration.  Acute kidney injury superimposed on CKD Acute kidney injury on CKD stage IIIa.  Creatinine up to 1.67 today.  Will restart IV fluid hydration.  Chronic diastolic CHF (congestive heart failure) No signs of heart failure currently watch closely with IV fluids.  Holding antihypertensive medication.   Anemia of chronic disease With ferritin being above 100 usually goes along with anemia of chronic disease.  Last hemoglobin 8.6.  GERD (gastroesophageal reflux disease) Continue PPI  Sleep apnea CPAP  Hyponatremia Sodium now in normal  range.  Hypokalemia Replaced        Subjective: Patient still not feeling well.  Still having pain and left flank and left lower back.  Admitted with clinical sepsis.  Physical Exam: Vitals:   07/19/22 1510 07/19/22 2008 07/20/22 0434 07/20/22 0806  BP: 110/60 105/60 124/65 129/71  Pulse:  (!) 58 62 67  Resp: 18 18 19 16   Temp: 98.1 F (36.7 C) (!) 97.4 F (36.3 C) 98 F (36.7 C) 97.6 F (36.4 C)  TempSrc: Oral Oral Oral Oral  SpO2: 100% 95% 100% 93%  Weight:   101.3 kg   Height:       Physical Exam HENT:     Head: Normocephalic.     Mouth/Throat:     Pharynx: No oropharyngeal exudate.  Eyes:     General: Lids are normal.     Conjunctiva/sclera: Conjunctivae normal.  Cardiovascular:     Rate and Rhythm: Normal rate and regular rhythm.     Heart sounds: Normal heart sounds, S1 normal and S2 normal.  Pulmonary:     Breath sounds: Normal breath sounds. No decreased breath sounds, wheezing, rhonchi or rales.  Abdominal:     Palpations: Abdomen is soft.     Tenderness: There is abdominal tenderness in the suprapubic area and left lower quadrant.  Musculoskeletal:     Right lower leg: No swelling.     Left lower leg: No swelling.     Comments: Pain left sacroiliac joint  Skin:    General: Skin is warm.     Findings: No rash.  Neurological:     Mental Status: She is alert and oriented to person, place, and time.     Data  Reviewed: Urine culture only with 20,000 colonies of E. coli, blood cultures negative for 2 days, creatinine 1.67, sodium 135, white blood cell count 15.5, hemoglobin 8.6, platelet count 226  Family Communication: Updated son on the phone  Disposition: Status is: Inpatient Remains inpatient appropriate because: Continue IV antibiotics for sepsis.  Planned Discharge Destination: Home with Home Health    Time spent: 28 minutes  Author: Loletha Grayer, MD 07/20/2022 1:06 PM  For on call review [email or web address].

## 2022-07-20 NOTE — Assessment & Plan Note (Signed)
Acute kidney injury on CKD stage IIIa.  Creatinine up to 1.67 today.  Will restart IV fluid hydration.

## 2022-07-20 NOTE — Plan of Care (Signed)

## 2022-07-20 NOTE — Consult Note (Signed)
   [facility] CM Inpatient Consult   07/20/2022  [name] [name] 03/05/1966 [id number]  Orientation with [name] [name], [facility] Liaison for review.   Location: [facility] Liaison met with pt via bedside [facility]).   [facility]) [place] Patient: Insurance [facility])    Primary Care Provider:  [name], MD with [name] [name]   Patient screened for 7 and 30 days readmission hospitalization with noted Extreme high risk  with 4 IP and 1 ED within 6 months. [facility] RN will assess for unplanned readmission risk for potential [facility]) Care Management service needs for post hospital transition for care coordination. Pt receptive to [facility] services for ongoing post hospital follow up calls. Pt verified she has sufficient transportation and support symptom. Pt was given an appointment reminder card and 24 hours Nurse Advise Line magnet.      Plan:  HIPAA verified and [facility] RN will continue to follow ongoing disposition to assess for post hospital community care coordination/management needs.  Referral request for community care coordination: pending disposition.   [place] does not replace or interfere with any arrangements made by the Inpatient Transition of Care team.   For questions contact:    [name] Mina, RN, [place] Hours M-F 8:00 am to 5 pm [phone] mobile [phone] [Office toll free line][facility] Office Hours are M-F 8:30 - 5 pm 24 hour nurse advise line [phone] [email or web address]  [name]

## 2022-07-20 NOTE — Evaluation (Signed)
Physical Therapy Evaluation Patient Details Name: [name] [name] MRN: [id number] DOB: [birth year] Today's Date: 07/20/2022  History of Present Illness  Donabelle Picquet is a 57yoF who comes to [facility] on 07/18/22 c UTI and flu-like symptoms; recent ortho surgery 2/12. 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  Clinical Impression  Pt in bed on entry, meal completed, pt agreeable to session despite high pain levels in back, hip, shoulders. Pt able to perform all mobility without physical assist, but is exerted and struggles to AMB more than household distances. No LOB noted in session. Pt has good safety awareness regarding precautions and safe use of RW. Pt feels she can safely manage at home at DC and is hoping to start [facility] services to regain strength and mobility. Baseline needs for assistance for ADL will remain at DC. Will continue to follow.      Recommendations for follow up therapy are one component of a multi-disciplinary discharge planning process, led by the attending physician.  Recommendations may be updated based on patient status, additional functional criteria and insurance authorization.  Follow Up Recommendations Can patient physically be transported by private vehicle: Yes     Assistance Recommended at Discharge Set up Supervision/Assistance  Patient can return home with the following  A little help with walking and/or transfers;Assistance with cooking/housework;Assist for transportation;Help with stairs or ramp for entrance;A lot of help with bathing/dressing/bathroom    Equipment Recommendations None recommended by PT  Recommendations for Other Services       Functional Status Assessment Patient has had a recent decline in their functional status and demonstrates the ability to make significant improvements in function in a reasonable and predictable amount of time.     Precautions /  Restrictions Precautions Precautions: Fall Other Brace: Rt CAM rocker boot, CL shoe Restrictions Weight Bearing Restrictions: No (WBAT per pt report and last admission [facility] previous week)      Mobility  Bed Mobility Overal bed mobility: Modified Independent Bed Mobility: Supine to Sit Rolling: Modified independent (Device/Increase time)              Transfers Overall transfer level: Needs assistance Equipment used: Rolling walker (2 wheels) Transfers: Sit to/from Stand Sit to Stand: Supervision           General transfer comment: From EOB, from toilet, from recliner    Ambulation/Gait Ambulation/Gait assistance: Supervision Gait Distance (Feet): 130 Feet Assistive device: Rolling walker (2 wheels)   Gait velocity: 0.53m/s     General Gait Details: heavy weightbearing on RW, appears exerted, good awareness of limitations  Stairs Stairs:  (defer to later in week pending progress with mobility)          Wheelchair Mobility    Modified Rankin (Stroke Patients Only)       Balance                                             Pertinent Vitals/Pain Pain Assessment Pain Assessment: 0-10 Pain Score: 6  Pain Location: hip, back, shoulders Pain Intervention(s): Limited activity within patient's tolerance, Monitored during session    Home Living Family/patient expects to be discharged to:: Private residence Living Arrangements: Children;Other relatives (Adult son, son's fianceee and their two children (<18yo)) Available Help at Discharge: Family Type of Home: House Home Access: Stairs  to enter Entrance Stairs-Rails: Left Entrance Stairs-Number of Steps: 3   Home Layout: One level Home Equipment: Conservation officer, nature (2 wheels);Cane - single point;BSC/3in1 Additional Comments: still has a knee scooter (not renting it)    Prior Function Prior Level of Function : Needs assist             Mobility Comments: pt reports using RW to  mobilize, WBAT in CAM boot ADLs Comments: PRN help for dressing and bathing from son     Hand Dominance   Dominant Hand: Right    Extremity/Trunk Assessment                Communication      Cognition Arousal/Alertness: Awake/alert Behavior During Therapy: WFL for tasks assessed/performed Overall Cognitive Status: Within Functional Limits for tasks assessed                                          General Comments      Exercises     Assessment/Plan    PT Assessment Patient needs continued PT services  PT Problem List Decreased strength;Decreased balance;Decreased activity tolerance;Decreased knowledge of use of DME;Decreased knowledge of precautions;Decreased mobility       PT Treatment Interventions DME instruction;Gait training;Stair training;Functional mobility training;Therapeutic activities;Therapeutic exercise;Balance training;Patient/family education    PT Goals (Current goals can be found in the Care Plan section)  Acute Rehab PT Goals Patient Stated Goal: return to home, commence [facility] services, regain strength PT Goal Formulation: With patient Time For Goal Achievement: 08/03/22 Potential to Achieve Goals: Good    Frequency Min 2X/week     Co-evaluation               AM-PAC PT "6 Clicks" Mobility  Outcome Measure Help needed turning from your back to your side while in a flat bed without using bedrails?: A Little Help needed moving from lying on your back to sitting on the side of a flat bed without using bedrails?: A Little Help needed moving to and from a bed to a chair (including a wheelchair)?: A Little Help needed standing up from a chair using your arms (e.g., wheelchair or bedside chair)?: A Little Help needed to walk in hospital room?: A Little Help needed climbing 3-5 steps with a railing? : A Lot 6 Click Score: 17    End of Session Equipment Utilized During Treatment: Gait belt Activity Tolerance: Patient  tolerated treatment well;Patient limited by fatigue;Patient limited by pain Patient left: in chair;with chair alarm set;with call bell/phone within reach Nurse Communication: Mobility status PT Visit Diagnosis: Muscle weakness (generalized) (M62.81);Difficulty in walking, not elsewhere classified (R26.2)    Time: 0926-1000 PT Time Calculation (min) (ACUTE ONLY): 34 min   Charges:   PT Evaluation $PT Eval Low Complexity: 1 Low PT Treatments $Therapeutic Activity: 8-22 mins       10:13 AM, 07/20/22 Etta Grandchild, PT, DPT Physical Therapist - [facility]  [phone] ([place])    Meridith Romick C 07/20/2022, 10:10 AM

## 2022-07-21 DIAGNOSIS — A419 Sepsis, unspecified organism: Secondary | ICD-10-CM | POA: Diagnosis not present

## 2022-07-21 DIAGNOSIS — N390 Urinary tract infection, site not specified: Secondary | ICD-10-CM | POA: Diagnosis not present

## 2022-07-21 LAB — COMPREHENSIVE METABOLIC PANEL
ALT: 22 U/L (ref 0–44)
AST: 25 U/L (ref 15–41)
Albumin: 2.9 g/dL — ABNORMAL LOW (ref 3.5–5.0)
Alkaline Phosphatase: 62 U/L (ref 38–126)
Anion gap: 8 (ref 5–15)
BUN: 49 mg/dL — ABNORMAL HIGH (ref 6–20)
CO2: 21 mmol/L — ABNORMAL LOW (ref 22–32)
Calcium: 8.6 mg/dL — ABNORMAL LOW (ref 8.9–10.3)
Chloride: 107 mmol/L (ref 98–111)
Creatinine, Ser: 1.55 mg/dL — ABNORMAL HIGH (ref 0.44–1.00)
GFR, Estimated: 39 mL/min — ABNORMAL LOW (ref >60)
Glucose, Bld: 120 mg/dL — ABNORMAL HIGH (ref 70–99)
Potassium: 4.4 mmol/L (ref 3.5–5.1)
Sodium: 136 mmol/L (ref 135–145)
Total Bilirubin: 0.3 mg/dL (ref 0.3–1.2)
Total Protein: 6.5 g/dL (ref 6.5–8.1)

## 2022-07-21 LAB — URINE CULTURE: Culture: 20000 — AB

## 2022-07-21 LAB — IRON AND TIBC
Iron: 103 ug/dL (ref 28–170)
Saturation Ratios: 45 % — ABNORMAL HIGH (ref 10.4–31.8)
TIBC: 228 ug/dL — ABNORMAL LOW (ref 250–450)
UIBC: 125 ug/dL

## 2022-07-21 LAB — CBC
HCT: 23.3 % — ABNORMAL LOW (ref 36.0–46.0)
Hemoglobin: 7.6 g/dL — ABNORMAL LOW (ref 12.0–15.0)
MCH: 30.8 pg (ref 26.0–34.0)
MCHC: 32.6 g/dL (ref 30.0–36.0)
MCV: 94.3 fL (ref 80.0–100.0)
Platelets: 226 10*3/uL (ref 150–400)
RBC: 2.47 MIL/uL — ABNORMAL LOW (ref 3.87–5.11)
RDW: 13.7 % (ref 11.5–15.5)
WBC: 10.1 10*3/uL (ref 4.0–10.5)
nRBC: 0 % (ref 0.0–0.2)

## 2022-07-21 LAB — FERRITIN: Ferritin: 224 ng/mL (ref 11–307)

## 2022-07-21 MED ORDER — METHOCARBAMOL 500 MG PO TABS
500.0000 mg | ORAL_TABLET | Freq: Three times a day (TID) | ORAL | Status: DC
  Administered 2022-07-21 – 2022-07-23 (×7): 500 mg via ORAL
  Filled 2022-07-21 (×7): qty 1

## 2022-07-21 MED ORDER — LOSARTAN POTASSIUM 50 MG PO TABS
50.0000 mg | ORAL_TABLET | Freq: Every day | ORAL | Status: DC
  Administered 2022-07-21 – 2022-07-23 (×3): 50 mg via ORAL
  Filled 2022-07-21 (×3): qty 1

## 2022-07-21 MED ORDER — SUMATRIPTAN SUCCINATE 50 MG PO TABS: 50.0000 mg | ORAL_TABLET | ORAL | Status: DC | PRN

## 2022-07-21 MED ORDER — SPIRONOLACTONE 25 MG PO TABS
25.0000 mg | ORAL_TABLET | Freq: Every day | ORAL | Status: DC
  Administered 2022-07-21 – 2022-07-23 (×3): 25 mg via ORAL
  Filled 2022-07-21 (×3): qty 1

## 2022-07-21 MED ORDER — ESTRADIOL 0.5 MG PO TABS
1.0000 mg | ORAL_TABLET | Freq: Every day | ORAL | Status: DC
  Administered 2022-07-21 – 2022-07-22 (×2): 1 mg via ORAL
  Filled 2022-07-21 (×2): qty 2

## 2022-07-21 MED ORDER — METHYLNALTREXONE BROMIDE 150 MG PO TABS: 450.0000 mg | ORAL_TABLET | Freq: Every morning | ORAL | Status: DC

## 2022-07-21 MED ORDER — CEFADROXIL 500 MG PO CAPS
1000.0000 mg | ORAL_CAPSULE | Freq: Two times a day (BID) | ORAL | Status: DC
  Administered 2022-07-22 – 2022-07-23 (×2): 1000 mg via ORAL
  Filled 2022-07-21 (×2): qty 2

## 2022-07-21 MED ORDER — BENZONATATE 100 MG PO CAPS: 200.0000 mg | ORAL_CAPSULE | Freq: Three times a day (TID) | ORAL | Status: DC | PRN

## 2022-07-21 NOTE — Progress Notes (Signed)
    Durable Medical Equipment  (From admission, onward)           Start     Ordered   07/21/22 1450  For home use only DME Shower stool  Once        07/21/22 1450   07/21/22 1450  For home use only DME Hospital bed  Once       Question Answer Comment  Length of Need Lifetime   Patient has (list medical condition): CHF, chronic pain   The above medical condition requires: Patient requires the ability to reposition frequently   Head must be elevated greater than: 45 degrees   Bed type Semi-electric      07/21/22 1450

## 2022-07-21 NOTE — Evaluation (Signed)
Occupational Therapy Evaluation Patient Details Name: [name] [name] MRN: [id number] DOB: 1965/04/21 Today's Date: 07/21/2022   History of Present Illness Faiza Kurzawa is a 34yoF who comes to [facility] on 07/18/22 c UTI and flu-like symptoms; recent ortho surgery 2/12. 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   Clinical Impression   Patient presenting with decreased Ind in self care, balance, functional mobility/transfers, endurance, and safety awareness. Patient reports living at home with family who assist with self care needs and she ambulates with RW at baseline. Patient currently performing bed mobility without physical assistance. Pt stands with supervision - min guard. Pt needing assistance to don R CAM boot and L shoe while seated on EOB. Pt ambulates with supervision and use of RW to bathroom for toileting needs. Assistance with hygiene and pt returns to sit in recliner chair.  Patient will benefit from acute OT to increase overall independence in the areas of ADLs, functional mobility, and safety awareness in order to safely discharge.     Recommendations for follow up therapy are one component of a multi-disciplinary discharge planning process, led by the attending physician.  Recommendations may be updated based on patient status, additional functional criteria and insurance authorization.   Assistance Recommended at Discharge Intermittent Supervision/Assistance  Patient can return home with the following A little help with walking and/or transfers;A lot of help with bathing/dressing/bathroom;Assistance with cooking/housework;Direct supervision/assist for medications management;Direct supervision/assist for financial management;Assist for transportation;Help with stairs or ramp for entrance    Functional Status Assessment  Patient has had a recent decline in their functional status and demonstrates the  ability to make significant improvements in function in a reasonable and predictable amount of time.  Equipment Recommendations  None recommended by OT    Recommendations for Other Services       Precautions / Restrictions Precautions Precautions: Fall Required Braces or Orthoses: Other Brace Other Brace: Rt CAM rocker boot, CL shoe Restrictions Weight Bearing Restrictions: No RUE Weight Bearing: Weight bearing as tolerated RLE Weight Bearing: Weight bearing as tolerated      Mobility Bed Mobility Overal bed mobility: Modified Independent             General bed mobility comments: No assist needed, use of rails.    Transfers Overall transfer level: Needs assistance Equipment used: Rolling walker (2 wheels) Transfers: Sit to/from Stand Sit to Stand: Supervision, Min guard           General transfer comment: supervision from EOB and min guard from toilet with use of RW      Balance Overall balance assessment: Needs assistance Sitting-balance support: Feet supported, No upper extremity supported Sitting balance-Leahy Scale: Good     Standing balance support: During functional activity, Reliant on assistive device for balance, Bilateral upper extremity supported Standing balance-Leahy Scale: Fair                             ADL either performed or assessed with clinical judgement   ADL Overall ADL's : Needs assistance/impaired                     Lower Body Dressing: Maximal assistance Lower Body Dressing Details (indicate cue type and reason): assistance to don CAM boot and L shoe Toilet Transfer: Min guard;Rolling walker (2 wheels);Regular Toilet   Toileting- Clothing Manipulation and Hygiene: Minimal assistance;Sit to/from stand Toileting -  Clothing Manipulation Details (indicate cue type and reason): assistance for hygiene             Vision Baseline Vision/History: 0 No visual deficits              Pertinent Vitals/Pain  Pain Assessment Pain Assessment: Faces Faces Pain Scale: Hurts little more Pain Location: generalized Pain Descriptors / Indicators: Aching, Discomfort Pain Intervention(s): Limited activity within patient's tolerance, Monitored during session, Repositioned     Hand Dominance Right   Extremity/Trunk Assessment Upper Extremity Assessment RUE Deficits / Details: decreased shoulder elevation ~ 90 degrees LUE Deficits / Details: decreased shoulder elevation ~ 90 degrees           Communication Communication Communication: No difficulties   Cognition Arousal/Alertness: Awake/alert Behavior During Therapy: WFL for tasks assessed/performed Overall Cognitive Status: Within Functional Limits for tasks assessed                                 General Comments: Pt is pleasant and cooperative                Home Living Family/patient expects to be discharged to:: Private residence Living Arrangements: Children;Other relatives Available Help at Discharge: Family Type of Home: House Home Access: Stairs to enter [employer] of Steps: 3 Entrance Stairs-Rails: Left Home Layout: One level     Bathroom Shower/Tub: Walk-in shower;Sponge bathes at [place]: Conservation officer, nature (2 wheels);Cane - single point;BSC/3in1   Additional Comments: still has a knee scooter (not renting it)      Prior Functioning/Environment Prior Level of Function : Needs assist             Mobility Comments: pt reports using RW to mobilize, WBAT in CAM boot ADLs Comments: Pt reports sink bath with mod A from son for bathing and dressing and family performs IADLs        OT Problem List: Decreased strength;Decreased range of motion;Decreased activity tolerance;Impaired balance (sitting and/or standing);Decreased safety awareness;Decreased knowledge of use of DME or AE;Cardiopulmonary status limiting activity;Pain;Decreased  knowledge of precautions      OT Treatment/Interventions: Self-care/ADL training;Therapeutic exercise;DME and/or AE instruction;Energy conservation;Therapeutic activities;Patient/family education;Balance training    OT Goals(Current goals can be found in the care plan section) Acute Rehab OT Goals Patient Stated Goal: to go home OT Goal Formulation: With patient Time For Goal Achievement: 08/04/22 Potential to Achieve Goals: Good ADL Goals Pt Will Perform Grooming: standing;with modified independence Pt Will Perform Lower Body Dressing: with min assist;sit to/from stand Pt Will Transfer to Toilet: with modified independence;ambulating Pt Will Perform Toileting - Clothing Manipulation and hygiene: with min assist;sit to/from stand  OT Frequency: Min 2X/week       AM-PAC OT "6 Clicks" Daily Activity     Outcome Measure Help from another person eating meals?: None Help from another person taking care of personal grooming?: A Little Help from another person toileting, which includes using toliet, bedpan, or urinal?: A Little Help from another person bathing (including washing, rinsing, drying)?: A Lot Help from another person to put on and taking off regular upper body clothing?: A Little Help from another person to put on and taking off regular lower body clothing?: A Lot 6 Click Score: 17   End of Session Equipment Utilized During Treatment: Rolling walker (2 wheels) Nurse Communication: Mobility status  Activity Tolerance: Patient tolerated treatment well  Patient left: in chair;with call bell/phone within reach;with chair alarm set  OT Visit Diagnosis: Unsteadiness on feet (R26.81);Other abnormalities of gait and mobility (R26.89);Muscle weakness (generalized) (M62.81);Pain                Time: 1022-1040 OT Time Calculation (min): 18 min Charges:  OT General Charges $OT Visit: 1 Visit OT Evaluation $OT Eval Moderate Complexity: 1 Mod OT Treatments $Self Care/Home Management  : 8-22 mins  Darleen Crocker, MS, OTR/L , CBIS ascom [phone]  07/21/22, 11:49 AM

## 2022-07-21 NOTE — Progress Notes (Signed)
Physical Therapy Treatment Patient Details Name: [name] [name] MRN: [id number] DOB: 09-Nov-1965 Today's Date: 07/21/2022   History of Present Illness [name] [name] is a 4yoF who comes to [facility] on 07/18/22 c UTI and flu-like symptoms; recent ortho surgery 2/12. 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    PT Comments    Pt in bed on entry, agreeable to session- reports 3 AMB trips to BR today and was up to recliner earlier in day. Pt is assisted with foot wear management for mobility, then mod-I bed mobility to EOB.Pt partakes in transfers training, then after a recovery interval AMB in hallway, good progression in distance and speed since previous day. 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 Pt attempts performance of stairs but is limited again by orthopedic pain in left hip, but able to perform 3 steps without assistance, albeit appears fairly weak. Pt reports her son typically provides a great deal of assistance for stairs PTA. Will continue to follow.   Recommendations for follow up therapy are one component of a multi-disciplinary discharge planning process, led by the attending physician.  Recommendations may be updated based on patient status, additional functional criteria and insurance authorization.  Follow Up Recommendations  Can patient physically be transported by private vehicle: Yes    Assistance Recommended at Discharge Set up Supervision/Assistance  Patient can return home with the following A little help with walking and/or transfers;Assistance with cooking/housework;Assist for transportation;Help with stairs or ramp for entrance;A lot of help with bathing/dressing/bathroom   Equipment Recommendations  None recommended by PT    Recommendations for Other Services       Precautions / Restrictions Precautions Precautions: Fall Required Braces or Orthoses:  Other Brace Other Brace: Rt CAM rocker boot, CL shoe Restrictions Weight Bearing Restrictions: No RUE Weight Bearing: Weight bearing as tolerated RLE Weight Bearing: Weight bearing as tolerated     Mobility  Bed Mobility Overal bed mobility: Modified Independent Bed Mobility: Supine to Sit Rolling: Modified independent (Device/Increase time)              Transfers Overall transfer level: Needs assistance Equipment used: Rolling walker (2 wheels) Transfers: Sit to/from Stand Sit to Stand: Supervision                Ambulation/Gait Ambulation/Gait assistance: Supervision Gait Distance (Feet): 220 Feet Assistive device: Rolling walker (2 wheels)   Gait velocity: 0.34m/s 07/21/22; 0.59m/s 07/20/22     General Gait Details: heavy weightbearing on RW, appears exerted, good awareness of limitations; Left shoe, Right CAM rocker   Stairs  6 stairs c 2 hands on left handrail, minguard assist           Wheelchair Mobility    Modified Rankin (Stroke Patients Only)       Balance                                            Cognition Arousal/Alertness: Awake/alert Behavior During Therapy: WFL for tasks assessed/performed Overall Cognitive Status: Within Functional Limits for tasks assessed                                          Exercises  Other Exercises Other Exercises: STS from EOB x4 c RW ad lib Other Exercises: Single step up, step down, 2 hand support on left rail (forward up, backward down): performed 3x    General Comments        Pertinent Vitals/Pain Pain Assessment Pain Assessment: Faces Faces Pain Scale: Hurts even more Pain Location: hip, knee, shoulder (chronic OA locations) Pain Intervention(s): Limited activity within patient's tolerance, Monitored during session    Home Living Family/patient expects to be discharged to:: Private residence Living Arrangements: Children;Other relatives Available Help  at Discharge: Family Type of Home: House Home Access: Stairs to enter Entrance Stairs-Rails: Left Entrance Stairs-Number of Steps: 3   Home Layout: One level Home Equipment: Conservation officer, nature (2 wheels);Cane - single point;BSC/3in1 Additional Comments: still has a knee scooter (not renting it)    Prior Function            PT Goals (current goals can now be found in the care plan section) Acute Rehab PT Goals Patient Stated Goal: return to home, commence [facility] services, regain strength PT Goal Formulation: With patient Time For Goal Achievement: 08/03/22 Potential to Achieve Goals: Good Progress towards PT goals: Progressing toward goals    Frequency    Min 2X/week      PT Plan Equipment recommendations need to be updated    Co-evaluation              AM-PAC PT "6 Clicks" Mobility   Outcome Measure  Help needed turning from your back to your side while in a flat bed without using bedrails?: A Little Help needed moving from lying on your back to sitting on the side of a flat bed without using bedrails?: A Little Help needed moving to and from a bed to a chair (including a wheelchair)?: A Little Help needed standing up from a chair using your arms (e.g., wheelchair or bedside chair)?: A Little Help needed to walk in hospital room?: A Little Help needed climbing 3-5 steps with a railing? : A Lot 6 Click Score: 17    End of Session Equipment Utilized During Treatment: Gait belt Activity Tolerance: Patient tolerated treatment well;Patient limited by fatigue;Patient limited by pain Patient left: in chair;with chair alarm set;with call bell/phone within reach Nurse Communication: Mobility status PT Visit Diagnosis: Muscle weakness (generalized) (M62.81);Difficulty in walking, not elsewhere classified (R26.2)     Time: [id number] PT Time Calculation (min) (ACUTE ONLY): 33 min  Charges:  $Therapeutic Exercise: 23-37 mins                    2:46 PM, 07/21/22 Etta Grandchild, PT, DPT Physical Therapist - [facility]  [phone] ([place])    Morley C 07/21/2022, 2:42 PM

## 2022-07-21 NOTE — Progress Notes (Signed)
PROGRESS NOTE    [name] [name]  [id number] DOB: 11-07-1965 DOA: 07/18/2022 PCP: [name], MD    Brief Narrative:  57 year old female with anxiety, asthma, bipolar, depression, GERD, hypertension, prediabetes presents to the hospital with not feeling well.  She has some ear pain, burning on urination, left back pain and side pain, nausea, poor appetite and fever.  Admitted with sepsis.   4/1.  Patient still not feeling well.  Started on Rocephin for sepsis likely urinary source.  Solu-Medrol started for left sacroiliac pain. 4/2.  Blood cultures negative for 2 days.  Urine culture only growing out 20,000 E. coli.  Continue Rocephin.  Continue Solu-Medrol. 4/3: Blood cultures remain negative.  Pansensitive E. coli noted on urine culture.   Assessment & Plan:   Principal Problem:   Sepsis secondary to UTI Active Problems:   Acute cystitis with hematuria   Pain of left sacroiliac joint   Hypotension   Chronic diastolic CHF (congestive heart failure)   Acute kidney injury superimposed on CKD   Chronic kidney disease, stage III (moderate)   Anemia of chronic disease   GERD (gastroesophageal reflux disease)   Sleep apnea   Hypokalemia   Hyponatremia  Sepsis secondary to UTI Present on admission, likely urinary source with positive urine analysis.  Blood cultures negative for 3 days.  Urine culture only growing out 20,000 E. coli.  Pansensitive.  Continue IV antibiotics for now.   Acute cystitis with hematuria Continue Rocephin as above pansensitive UTI   Pain of left sacroiliac joint Multimodal pain regimen Therapy evaluations Minimize use of narcotics and sedating medications   Hypotension Improved.  Will slowly restart home antihypertensive regimen   Acute kidney injury superimposed on CKD Acute kidney injury on CKD stage IIIa.  Creatinine up to 1.67 today.  Will restart IV fluid hydration.   Chronic diastolic CHF (congestive heart failure) No signs of  heart failure currently watch closely with IV fluids.  Holding antihypertensive medication.     Anemia of chronic disease Suspect anemia of chronic disease in the setting of multiple chronic illnesses.  Hemoglobin relatively stable in the low.  No indication for transfusion currently.   GERD (gastroesophageal reflux disease) Continue PPI   Sleep apnea CPAP   Hyponatremia Sodium now in normal range.   Hypokalemia Replaced    DVT prophylaxis: SQ heparin Code Status: Full Family Communication: None today Disposition Plan: Status is: Inpatient Remains inpatient appropriate because: UTI on IV antibiotics   Level of care: Med-Surg  Consultants:  None  Procedures:  None  Antimicrobials: Ceftriaxone   Subjective: Seen and examined appears fatigued.  Lying in bed.  Objective: Vitals:   07/20/22 0806 07/20/22 1522 07/20/22 2022 07/21/22 0818  BP: 129/71 124/74 131/73 (!) 144/77  Pulse: 67 71 65 63  Resp: 16 18 20 18   Temp: 97.6 F (36.4 C) 97.7 F (36.5 C) 97.9 F (36.6 C) 98.4 F (36.9 C)  TempSrc: Oral Oral Oral   SpO2: 93% 98% 99% 100%  Weight:      Height:        Intake/Output Summary (Last 24 hours) at 07/21/2022 1336 Last data filed at 07/21/2022 1035 Gross per 24 hour  Intake 1764.95 ml  Output 1350 ml  Net 414.95 ml   Filed Weights   07/18/22 1730 07/20/22 0434  Weight: 103 kg 101.3 kg    Examination:  General exam: Appears fatigued and uncomfortable Respiratory system: Decreased air entry bilaterally.  Scattered crackles.  Normal work of  breathing.  2 L Cardiovascular system: S1-S2, RRR, no murmurs, no pedal edema Gastrointestinal system: Obese, soft, NT/ND, normal bowel sounds Central nervous system: Alert and oriented. No focal neurological deficits. Extremities: Decreased power left lower extremity decreased ROM.  Gait not assessed Skin: No rashes, lesions or ulcers Psychiatry: Judgement and insight appear normal. Mood & affect appropriate.      Data Reviewed: I have personally reviewed following labs and imaging studies  CBC: Recent Labs  Lab 07/18/22 1733 07/19/22 0522 07/20/22 0422 07/21/22 0430  WBC 12.7* 19.9* 15.5* 10.1  NEUTROABS 10.0*  --   --   --   HGB 8.6* 8.4* 8.6* 7.6*  HCT 26.1* 25.3* 26.6* 23.3*  MCV 93.2 93.7 94.0 94.3  PLT 260 251 226 [id number]   Basic Metabolic Panel: Recent Labs  Lab 07/18/22 1733 07/19/22 0522 07/20/22 0422 07/21/22 0430  NA 131* 132* 135 136  K 3.9 3.4* 4.3 4.4  CL 102 103 107 107  CO2 21* 19* 19* 21*  GLUCOSE 108* 116* 121* 120*  BUN 26* 29* 37* 49*  CREATININE 1.28* 1.44* 1.67* 1.55*  CALCIUM 8.8* 8.5* 8.7* 8.6*   GFR: Estimated Creatinine Clearance: 45.8 mL/min (A) (by C-G formula based on SCr of 1.55 mg/dL (H)). Liver Function Tests: Recent Labs  Lab 07/18/22 1733 07/19/22 0522 07/21/22 0430  AST 18 16 25   ALT 12 12 22   ALKPHOS 87 81 62  BILITOT 0.8 0.8 0.3  PROT 7.2 6.8 6.5  ALBUMIN 3.3* 3.2* 2.9*   No results for input(s): "LIPASE", "AMYLASE" in the last 168 hours. No results for input(s): "AMMONIA" in the last 168 hours. Coagulation Profile: No results for input(s): "INR", "PROTIME" in the last 168 hours. Cardiac Enzymes: No results for input(s): "CKTOTAL", "CKMB", "CKMBINDEX", "TROPONINI" in the last 168 hours. BNP (last 3 results) No results for input(s): "PROBNP" in the last 8760 hours. HbA1C: Recent Labs    07/18/22 1733  HGBA1C 5.4   CBG: No results for input(s): "GLUCAP" in the last 168 hours. Lipid Profile: No results for input(s): "CHOL", "HDL", "LDLCALC", "TRIG", "CHOLHDL", "LDLDIRECT" in the last 72 hours. Thyroid Function Tests: No results for input(s): "TSH", "T4TOTAL", "FREET4", "T3FREE", "THYROIDAB" in the last 72 hours. Anemia Panel: Recent Labs    07/21/22 0921  FERRITIN 224  TIBC 228*  IRON 103   Sepsis Labs: Recent Labs  Lab 07/18/22 1733 07/18/22 1750 07/18/22 1952  PROCALCITON  --  0.17  --   LATICACIDVEN 1.2   --  1.6    Recent Results (from the past 240 hour(s))  Resp panel by RT-PCR (RSV, Flu A&B, Covid) Urine, Clean Catch     Status: None   Collection Time: 07/18/22  7:12 PM   Specimen: Urine, Clean Catch; Nasal Swab  Result Value Ref Range Status   SARS Coronavirus 2 by RT PCR NEGATIVE NEGATIVE Final    Comment: (NOTE) SARS-CoV-2 target nucleic acids are NOT DETECTED.  The SARS-CoV-2 RNA is generally detectable in upper respiratory specimens during the acute phase of infection. The lowest concentration of SARS-CoV-2 viral copies this assay can detect is 138 copies/mL. A negative result does not preclude SARS-Cov-2 infection and should not be used as the sole basis for treatment or other patient management decisions. A negative result may occur with  improper specimen collection/handling, submission of specimen other than nasopharyngeal swab, presence of viral mutation(s) within the areas targeted by this assay, and inadequate number of viral copies(<138 copies/mL). A negative result must be combined with  clinical observations, patient history, and epidemiological information. The expected result is Negative.  Fact Sheet for Patients:  EntrepreneurPulse.com.au  Fact Sheet for Healthcare Providers:  IncredibleEmployment.be  This test is no t yet approved or cleared by the [place] FDA and  has been authorized for detection and/or diagnosis of SARS-CoV-2 by FDA under an Emergency Use Authorization (EUA). This EUA will remain  in effect (meaning this test can be used) for the duration of the COVID-19 declaration under Section 564(b)(1) of the Act, 21 U.S.C.section 360bbb-3(b)(1), unless the authorization is terminated  or revoked sooner.       Influenza A by PCR NEGATIVE NEGATIVE Final   Influenza B by PCR NEGATIVE NEGATIVE Final    Comment: (NOTE) The Xpert Xpress SARS-CoV-2/FLU/RSV plus assay is intended as an aid in the diagnosis of  influenza from Nasopharyngeal swab specimens and should not be used as a sole basis for treatment. Nasal washings and aspirates are unacceptable for Xpert Xpress SARS-CoV-2/FLU/RSV testing.  Fact Sheet for Patients: EntrepreneurPulse.com.au  Fact Sheet for Healthcare Providers: IncredibleEmployment.be  This test is not yet approved or cleared by the [place] FDA and has been authorized for detection and/or diagnosis of SARS-CoV-2 by FDA under an Emergency Use Authorization (EUA). This EUA will remain in effect (meaning this test can be used) for the duration of the COVID-19 declaration under Section 564(b)(1) of the Act, 21 U.S.C. section 360bbb-3(b)(1), unless the authorization is terminated or revoked.     Resp Syncytial Virus by PCR NEGATIVE NEGATIVE Final    Comment: (NOTE) Fact Sheet for Patients: EntrepreneurPulse.com.au  Fact Sheet for Healthcare Providers: IncredibleEmployment.be  This test is not yet approved or cleared by the [place] FDA and has been authorized for detection and/or diagnosis of SARS-CoV-2 by FDA under an Emergency Use Authorization (EUA). This EUA will remain in effect (meaning this test can be used) for the duration of the COVID-19 declaration under Section 564(b)(1) of the Act, 21 U.S.C. section 360bbb-3(b)(1), unless the authorization is terminated or revoked.  Performed at [facility], [address] [place], [place] [address]   Urine Culture     Status: Abnormal   Collection Time: 07/18/22  7:12 PM   Specimen: Urine, Clean Catch  Result Value Ref Range Status   Specimen Description   Final    URINE, CLEAN CATCH Performed at [facility], [address] [place], [place] [address]    Special Requests   Final    NONE Performed at [facility], [place],  [address]    Culture 20,000 COLONIES/mL  ESCHERICHIA COLI (A)  Final   Report Status 07/21/2022 FINAL  Final   Organism ID, Bacteria ESCHERICHIA COLI (A)  Final      Susceptibility   Escherichia coli - MIC*    AMPICILLIN <=2 SENSITIVE Sensitive     CEFAZOLIN <=4 SENSITIVE Sensitive     CEFEPIME <=0.12 SENSITIVE Sensitive     CEFTRIAXONE <=0.25 SENSITIVE Sensitive     CIPROFLOXACIN <=0.25 SENSITIVE Sensitive     GENTAMICIN <=1 SENSITIVE Sensitive     IMIPENEM <=0.25 SENSITIVE Sensitive     NITROFURANTOIN <=16 SENSITIVE Sensitive     TRIMETH/SULFA <=20 SENSITIVE Sensitive     AMPICILLIN/SULBACTAM <=2 SENSITIVE Sensitive     PIP/TAZO <=4 SENSITIVE Sensitive     * 20,000 COLONIES/mL ESCHERICHIA COLI  Blood culture (routine x 2)     Status: None (Preliminary result)   Collection Time: 07/18/22  7:32 PM  Specimen: BLOOD  Result Value Ref Range Status   Specimen Description BLOOD BLOOD LEFT ARM  Final   Special Requests   Final    BOTTLES DRAWN AEROBIC AND ANAEROBIC Blood Culture results may not be optimal due to an excessive volume of blood received in culture bottles   Culture   Final    NO GROWTH 3 DAYS Performed at [facility], [address] [place], [place] [address]    Report Status PENDING  Incomplete  Blood culture (routine x 2)     Status: None (Preliminary result)   Collection Time: 07/18/22  7:32 PM   Specimen: Left Antecubital; Blood  Result Value Ref Range Status   Specimen Description LEFT ANTECUBITAL  Final   Special Requests   Final    BOTTLES DRAWN AEROBIC AND ANAEROBIC Blood Culture adequate volume   Culture   Final    NO GROWTH 3 DAYS Performed at [facility], [address], [place], [place] [address]    Report Status PENDING  Incomplete         Radiology Studies: No results found.      Scheduled Meds:  busPIRone  15 mg Oral TID   [START ON 07/22/2022] cefadroxil  1,000 mg Oral BID   escitalopram  10 mg Oral Daily   estradiol  1 mg Oral QHS   famotidine  20 mg  Oral QHS   feeding supplement  237 mL Oral BID BM   fesoterodine  4 mg Oral Daily   heparin  5,000 Units Subcutaneous Q8H   lamoTRIgine  100 mg Oral Daily   loratadine  10 mg Oral QPM   methocarbamol  500 mg Oral TID   pantoprazole  40 mg Oral BID   polyethylene glycol  17 g Oral Daily   pregabalin  100 mg Oral TID   sodium chloride flush  3 mL Intravenous Q12H   topiramate  50 mg Oral BID   Continuous Infusions:  sodium chloride 50 mL/hr at 07/21/22 0326   cefTRIAXone (ROCEPHIN)  IV Stopped (07/20/22 2057)     LOS: 2 days     Sidney Ace, MD [facility]   If 7PM-7AM, please contact night-coverage  07/21/2022, 1:36 PM

## 2022-07-21 NOTE — TOC Initial Note (Signed)
Transition of Care [facility]) - Initial/Assessment Note    Patient Details  Name: [name] [name] MRN: [id number] Date of Birth: 1965/12/05  [facility] CM/SW Contact:    Beverly Sessions, RN Phone Number: 07/21/2022, 2:45 PM  Clinical Narrative:                  Admitted for: sepsis UTI Admitted from: home with son PCP: [place] Current home health/prior home health/DME: RW, cane Patient requesting shower seat and hospital bed.  Referral made to [facility] with [facility] recommending home health. Patient states that she is already active with [facility].  Cory with [facility] notified of admission           Patient Goals and CMS Choice            Expected Discharge Plan and Services                                              Prior Living Arrangements/Services                       Activities of Daily Living Home Assistive Devices/Equipment: Gilford Rile (specify type) ADL Screening (condition at time of admission) Patient's cognitive ability adequate to safely complete daily activities?: Yes Is the patient deaf or have difficulty hearing?: No Does the patient have difficulty seeing, even when wearing glasses/contacts?: No Does the patient have difficulty concentrating, remembering, or making decisions?: No Patient able to express need for assistance with ADLs?: Yes Does the patient have difficulty dressing or bathing?: No Independently performs ADLs?: Yes (appropriate for developmental age) Communication: Independent Dressing (OT): Independent Grooming: Independent Feeding: Independent Bathing: Independent Toileting: Independent In/Out Bed: Independent Walks in Home: Independent Does the patient have difficulty walking or climbing stairs?: No Weakness of Legs: Both Weakness of Arms/Hands: None  Permission Sought/Granted                  Emotional Assessment              Admission diagnosis:  Dysuria [R30.0] Acute  pyelonephritis [N10] Sepsis [A41.9] Sepsis, due to unspecified organism, unspecified whether acute organ dysfunction present [A41.9] Patient Active Problem List   Diagnosis Date Noted   Acute kidney injury superimposed on CKD 07/20/2022   Pain of left sacroiliac joint 07/19/2022   Acute cystitis with hematuria 07/19/2022   Hypotension 07/19/2022   Chronic kidney disease, stage III (moderate) 07/19/2022   Anemia of chronic disease 07/19/2022   Acute encephalopathy 07/06/2022   Chronic diastolic CHF (congestive heart failure) 07/06/2022   Bilateral leg edema 06/27/2022   Right foot infection 05/27/2022   Morbid obesity 05/26/2022   Cellulitis of right foot 05/22/2022   Sepsis secondary to UTI 05/22/2022   Abscess of right foot 05/22/2022   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   Diarrhea in adult patient    Weakness 06/30/2021   Gastroenteritis, infectious 06/30/2021   Hyponatremia 06/30/2021   Open wound of right foot, initial encounter 10/21/2020   Finger ulcer 10/21/2020   Non-healing open wound of toe 05/15/2019   Osteomyelitis 05/15/2019   Palpitations 10/26/2018   Foot ulcer 01/31/2018   Hyperglycemia 01/31/2018   Left leg cellulitis 09/24/2017   Near syncope 11/23/2016   Anxiety 09/23/2016   Mild depressed bipolar I disorder 09/23/2016   Constipation 09/23/2016  DDD (degenerative disc disease), lumbosacral 09/23/2016   Migraines 09/23/2016   Myelopathy 09/23/2016   Post laminectomy syndrome 09/23/2016   Pseudoarthrosis of lumbar spine 09/23/2016   Sleep apnea 09/23/2016   Spinal stenosis of lumbar region 09/23/2016   Vertigo 09/23/2016   Hallux malleus 11/15/2015   Chronic pain syndrome    GERD (gastroesophageal reflux disease)    Depression    Rash 11/14/2015   Toe ulcer, right 04/11/2015   Cellulitis of toe of right foot 04/11/2015   Nonspecific chest pain    Obesity (BMI 30-39.9) 10/26/2014   Hypokalemia 10/26/2014   Manic  bipolar I disorder ([place]) 10/26/2014   Tardive dyskinesia 08/26/2011   Manic bipolar I disorder with rapid cycling 05/18/2011    Class: Acute   Asthma 02/24/2011   History of migraine headaches 02/24/2011   PCP:  [name], MD Pharmacy:   [facility], [place] [address] [place] [address] Phone: [phone] Fax: [phone]  [facility] - [place], [place] - [address] [place] [address] Phone: [phone] Fax: [phone]  [id number] - [place], [place] - [address] [place] [address] Phone: [phone] Fax: [phone]  [id number] - , [place] - [address] [place] [address] Phone: [phone] Fax: [phone]     Social Determinants of Health (SDOH) Social History: SDOH Screenings   Food Insecurity: No Food Insecurity (07/19/2022)  Housing: Low Risk  (07/19/2022)  Transportation Needs: No Transportation Needs (07/19/2022)  Utilities: Not At Risk (07/19/2022)  Alcohol Screen: Low Risk  (08/21/2020)  Depression (PHQ2-9): Low Risk  (06/21/2022)  Financial Resource Strain: Low Risk  (07/13/2022)  Physical Activity: Inactive (08/21/2020)  Social Connections: Socially Isolated (08/21/2020)  Stress: No Stress Concern Present (08/21/2020)  Tobacco Use: Low Risk  (07/19/2022)   SDOH Interventions:     Readmission Risk Interventions    07/21/2022    2:43 PM 06/25/2022    1:44 PM  Readmission Risk Prevention Plan  Transportation Screening Complete Complete  PCP or Specialist Appt within 3-5 Days  Complete  HRI or [place]  Complete  Palliative Care Screening  Not Applicable  Medication Review (RN Care Manager) Complete Complete  HRI or Home Care Consult Complete   SW Recovery Care/Counseling Consult Complete   Palliative Care Screening  Not [place] Not Applicable

## 2022-07-22 DIAGNOSIS — A419 Sepsis, unspecified organism: Secondary | ICD-10-CM | POA: Diagnosis not present

## 2022-07-22 DIAGNOSIS — N390 Urinary tract infection, site not specified: Secondary | ICD-10-CM | POA: Diagnosis not present

## 2022-07-22 LAB — CBC WITH DIFFERENTIAL/PLATELET
Abs Immature Granulocytes: 0.05 10*3/uL (ref 0.00–0.07)
Basophils Absolute: 0 10*3/uL (ref 0.0–0.1)
Basophils Relative: 0 %
Eosinophils Absolute: 0 10*3/uL (ref 0.0–0.5)
Eosinophils Relative: 0 %
HCT: 23.3 % — ABNORMAL LOW (ref 36.0–46.0)
Hemoglobin: 7.4 g/dL — ABNORMAL LOW (ref 12.0–15.0)
Immature Granulocytes: 1 %
Lymphocytes Relative: 34 %
Lymphs Abs: 3 10*3/uL (ref 0.7–4.0)
MCH: 30.3 pg (ref 26.0–34.0)
MCHC: 31.8 g/dL (ref 30.0–36.0)
MCV: 95.5 fL (ref 80.0–100.0)
Monocytes Absolute: 0.5 10*3/uL (ref 0.1–1.0)
Monocytes Relative: 6 %
Neutro Abs: 5.2 10*3/uL (ref 1.7–7.7)
Neutrophils Relative %: 59 %
Platelets: 232 10*3/uL (ref 150–400)
RBC: 2.44 MIL/uL — ABNORMAL LOW (ref 3.87–5.11)
RDW: 14.1 % (ref 11.5–15.5)
WBC: 8.7 10*3/uL (ref 4.0–10.5)
nRBC: 0 % (ref 0.0–0.2)

## 2022-07-22 LAB — BASIC METABOLIC PANEL
Anion gap: 6 (ref 5–15)
BUN: 43 mg/dL — ABNORMAL HIGH (ref 6–20)
CO2: 22 mmol/L (ref 22–32)
Calcium: 8.8 mg/dL — ABNORMAL LOW (ref 8.9–10.3)
Chloride: 111 mmol/L (ref 98–111)
Creatinine, Ser: 1.27 mg/dL — ABNORMAL HIGH (ref 0.44–1.00)
GFR, Estimated: 49 mL/min — ABNORMAL LOW (ref >60)
Glucose, Bld: 101 mg/dL — ABNORMAL HIGH (ref 70–99)
Potassium: 4 mmol/L (ref 3.5–5.1)
Sodium: 139 mmol/L (ref 135–145)

## 2022-07-22 MED ORDER — FUROSEMIDE 40 MG PO TABS
40.0000 mg | ORAL_TABLET | Freq: Every day | ORAL | Status: DC
  Administered 2022-07-22 – 2022-07-23 (×2): 40 mg via ORAL
  Filled 2022-07-22 (×2): qty 1

## 2022-07-22 NOTE — Care Management Important Message (Signed)
Important Message  Patient Details  Name: [name] [name] MRN: [id number] Date of Birth: Jan 24, 1966   Medicare Important Message Given:  Yes     Dannette Barbara 07/22/2022, 3:11 PM

## 2022-07-22 NOTE — Progress Notes (Signed)
PROGRESS NOTE    [name] [name]  [id number] DOB: July 26, 1965 DOA: 07/18/2022 PCP: [name], MD    Brief Narrative:  57 year old female with anxiety, asthma, bipolar, depression, GERD, hypertension, prediabetes presents to the hospital with not feeling well.  She has some ear pain, burning on urination, left back pain and side pain, nausea, poor appetite and fever.  Admitted with sepsis.   4/1.  Patient still not feeling well.  Started on Rocephin for sepsis likely urinary source.  Solu-Medrol started for left sacroiliac pain. 4/2.  Blood cultures negative for 2 days.  Urine culture only growing out 20,000 E. coli.  Continue Rocephin.  Continue Solu-Medrol. 4/3: Blood cultures remain negative.  Pansensitive E. coli noted on urine culture. 4/4: Slowly improving.   Assessment & Plan:   Principal Problem:   Sepsis secondary to UTI Active Problems:   Acute cystitis with hematuria   Pain of left sacroiliac joint   Hypotension   Chronic diastolic CHF (congestive heart failure)   Acute kidney injury superimposed on CKD   Chronic kidney disease, stage III (moderate)   Anemia of chronic disease   GERD (gastroesophageal reflux disease)   Sleep apnea   Hypokalemia   Hyponatremia  Sepsis secondary to UTI Present on admission, likely urinary source with positive urine analysis.  Blood cultures negative for 3 days.  Urine culture only growing out 20,000 E. coli.  Pansensitive.  Completed IV antibiotics.  No further antibiotics at this time.  Monitor vitals and fever curve.  Acute cystitis with hematuria Completed antibiotic course   Pain of left sacroiliac joint Multimodal pain regimen Therapy evaluations Minimize use of narcotics and sedating medications Anticipate home with home health 4/5   Hypotension Improved.  Will slowly restart home antihypertensive regimen   Acute kidney injury superimposed on CKD Acute kidney injury on CKD stage IIIa.  Improved.  Creatinine  improving.  Discontinue IV fluids   Chronic diastolic CHF (congestive heart failure) Resume home Lasix 40 mg daily     Anemia of chronic disease Suspect anemia of chronic disease in the setting of multiple chronic illnesses.  Hemoglobin relatively stable in the low.  No indication for transfusion currently.   GERD (gastroesophageal reflux disease) Continue PPI   Sleep apnea CPAP   Hyponatremia Sodium now in normal range.   Hypokalemia Replaced    DVT prophylaxis: SQ heparin Code Status: Full Family Communication: None today Disposition Plan: Status is: Inpatient Remains inpatient appropriate because: UTI on IV antibiotics   Level of care: Med-Surg  Consultants:  None  Procedures:  None  Antimicrobials:   Subjective: Seen and examined.  Doing well overall.  Objective: Vitals:   07/21/22 1614 07/21/22 1943 07/22/22 0612 07/22/22 0819  BP: 124/64 (!) 112/56 (!) 146/76 (!) 156/75  Pulse: 62 (!) 59 (!) 58 64  Resp: 16 18 18 18   Temp: 98.1 F (36.7 C) 97.9 F (36.6 C) 98.2 F (36.8 C) 98.4 F (36.9 C)  TempSrc:  Oral    SpO2: 98% 99% 100% 100%  Weight:      Height:        Intake/Output Summary (Last 24 hours) at 07/22/2022 1407 Last data filed at 07/22/2022 [id number] Gross per 24 hour  Intake 1605.4 ml  Output 350 ml  Net 1255.4 ml   Filed Weights   07/18/22 1730 07/20/22 0434  Weight: 103 kg 101.3 kg    Examination:  General exam: Fatigued Respiratory system: Bibasilar crackles.  Normal work of breathing.  2  L Cardiovascular system: S1-S2, RRR, no murmurs, no pedal edema Gastrointestinal system: Obese, soft, NT/ND, normal bowel sounds Central nervous system: Alert and oriented. No focal neurological deficits. Extremities: Decreased power left lower extremity decreased ROM.  Gait not assessed Skin: No rashes, lesions or ulcers Psychiatry: Judgement and insight appear normal. Mood & affect appropriate.     Data Reviewed: I have personally reviewed  following labs and imaging studies  CBC: Recent Labs  Lab 07/18/22 1733 07/19/22 0522 07/20/22 0422 07/21/22 0430 07/22/22 0721  WBC 12.7* 19.9* 15.5* 10.1 8.7  NEUTROABS 10.0*  --   --   --  5.2  HGB 8.6* 8.4* 8.6* 7.6* 7.4*  HCT 26.1* 25.3* 26.6* 23.3* 23.3*  MCV 93.2 93.7 94.0 94.3 95.5  PLT 260 251 226 226 [id number]   Basic Metabolic Panel: Recent Labs  Lab 07/18/22 1733 07/19/22 0522 07/20/22 0422 07/21/22 0430 07/22/22 0721  NA 131* 132* 135 136 139  K 3.9 3.4* 4.3 4.4 4.0  CL 102 103 107 107 111  CO2 21* 19* 19* 21* 22  GLUCOSE 108* 116* 121* 120* 101*  BUN 26* 29* 37* 49* 43*  CREATININE 1.28* 1.44* 1.67* 1.55* 1.27*  CALCIUM 8.8* 8.5* 8.7* 8.6* 8.8*   GFR: Estimated Creatinine Clearance: 55.9 mL/min (A) (by C-G formula based on SCr of 1.27 mg/dL (H)). Liver Function Tests: Recent Labs  Lab 07/18/22 1733 07/19/22 0522 07/21/22 0430  AST 18 16 25   ALT 12 12 22   ALKPHOS 87 81 62  BILITOT 0.8 0.8 0.3  PROT 7.2 6.8 6.5  ALBUMIN 3.3* 3.2* 2.9*   No results for input(s): "LIPASE", "AMYLASE" in the last 168 hours. No results for input(s): "AMMONIA" in the last 168 hours. Coagulation Profile: No results for input(s): "INR", "PROTIME" in the last 168 hours. Cardiac Enzymes: No results for input(s): "CKTOTAL", "CKMB", "CKMBINDEX", "TROPONINI" in the last 168 hours. BNP (last 3 results) No results for input(s): "PROBNP" in the last 8760 hours. HbA1C: No results for input(s): "HGBA1C" in the last 72 hours.  CBG: No results for input(s): "GLUCAP" in the last 168 hours. Lipid Profile: No results for input(s): "CHOL", "HDL", "LDLCALC", "TRIG", "CHOLHDL", "LDLDIRECT" in the last 72 hours. Thyroid Function Tests: No results for input(s): "TSH", "T4TOTAL", "FREET4", "T3FREE", "THYROIDAB" in the last 72 hours. Anemia Panel: Recent Labs    07/21/22 0921  FERRITIN 224  TIBC 228*  IRON 103   Sepsis Labs: Recent Labs  Lab 07/18/22 1733 07/18/22 1750  07/18/22 1952  PROCALCITON  --  0.17  --   LATICACIDVEN 1.2  --  1.6    Recent Results (from the past 240 hour(s))  Resp panel by RT-PCR (RSV, Flu A&B, Covid) Urine, Clean Catch     Status: None   Collection Time: 07/18/22  7:12 PM   Specimen: Urine, Clean Catch; Nasal Swab  Result Value Ref Range Status   SARS Coronavirus 2 by RT PCR NEGATIVE NEGATIVE Final    Comment: (NOTE) SARS-CoV-2 target nucleic acids are NOT DETECTED.  The SARS-CoV-2 RNA is generally detectable in upper respiratory specimens during the acute phase of infection. The lowest concentration of SARS-CoV-2 viral copies this assay can detect is 138 copies/mL. A negative result does not preclude SARS-Cov-2 infection and should not be used as the sole basis for treatment or other patient management decisions. A negative result may occur with  improper specimen collection/handling, submission of specimen other than nasopharyngeal swab, presence of viral mutation(s) within the areas targeted by this assay,  and inadequate number of viral copies(<138 copies/mL). A negative result must be combined with clinical observations, patient history, and epidemiological information. The expected result is Negative.  Fact Sheet for Patients:  EntrepreneurPulse.com.au  Fact Sheet for Healthcare Providers:  IncredibleEmployment.be  This test is no t yet approved or cleared by the [place] FDA and  has been authorized for detection and/or diagnosis of SARS-CoV-2 by FDA under an Emergency Use Authorization (EUA). This EUA will remain  in effect (meaning this test can be used) for the duration of the COVID-19 declaration under Section 564(b)(1) of the Act, 21 U.S.C.section 360bbb-3(b)(1), unless the authorization is terminated  or revoked sooner.       Influenza A by PCR NEGATIVE NEGATIVE Final   Influenza B by PCR NEGATIVE NEGATIVE Final    Comment: (NOTE) The Xpert Xpress  SARS-CoV-2/FLU/RSV plus assay is intended as an aid in the diagnosis of influenza from Nasopharyngeal swab specimens and should not be used as a sole basis for treatment. Nasal washings and aspirates are unacceptable for Xpert Xpress SARS-CoV-2/FLU/RSV testing.  Fact Sheet for Patients: EntrepreneurPulse.com.au  Fact Sheet for Healthcare Providers: IncredibleEmployment.be  This test is not yet approved or cleared by the [place] FDA and has been authorized for detection and/or diagnosis of SARS-CoV-2 by FDA under an Emergency Use Authorization (EUA). This EUA will remain in effect (meaning this test can be used) for the duration of the COVID-19 declaration under Section 564(b)(1) of the Act, 21 U.S.C. section 360bbb-3(b)(1), unless the authorization is terminated or revoked.     Resp Syncytial Virus by PCR NEGATIVE NEGATIVE Final    Comment: (NOTE) Fact Sheet for Patients: EntrepreneurPulse.com.au  Fact Sheet for Healthcare Providers: IncredibleEmployment.be  This test is not yet approved or cleared by the [place] FDA and has been authorized for detection and/or diagnosis of SARS-CoV-2 by FDA under an Emergency Use Authorization (EUA). This EUA will remain in effect (meaning this test can be used) for the duration of the COVID-19 declaration under Section 564(b)(1) of the Act, 21 U.S.C. section 360bbb-3(b)(1), unless the authorization is terminated or revoked.  Performed at [facility] [address] [place], [place] [address]   Urine Culture     Status: Abnormal   Collection Time: 07/18/22  7:12 PM   Specimen: Urine, Clean Catch  Result Value Ref Range Status   Specimen Description   Final    URINE, CLEAN CATCH Performed at [facility], [address], [place], [place] [address]    Special Requests   Final    NONE Performed at [facility], [place], [place] [address]    Culture 20,000 COLONIES/mL ESCHERICHIA COLI (A)  Final   Report Status 07/21/2022 FINAL  Final   Organism ID, Bacteria ESCHERICHIA COLI (A)  Final      Susceptibility   Escherichia coli - MIC*    AMPICILLIN <=2 SENSITIVE Sensitive     CEFAZOLIN <=4 SENSITIVE Sensitive     CEFEPIME <=0.12 SENSITIVE Sensitive     CEFTRIAXONE <=0.25 SENSITIVE Sensitive     CIPROFLOXACIN <=0.25 SENSITIVE Sensitive     GENTAMICIN <=1 SENSITIVE Sensitive     IMIPENEM <=0.25 SENSITIVE Sensitive     NITROFURANTOIN <=16 SENSITIVE Sensitive     TRIMETH/SULFA <=20 SENSITIVE Sensitive     AMPICILLIN/SULBACTAM <=2 SENSITIVE Sensitive     PIP/TAZO <=4 SENSITIVE Sensitive     * 20,000 COLONIES/mL ESCHERICHIA COLI  Blood culture (routine x 2)  Status: None (Preliminary result)   Collection Time: 07/18/22  7:32 PM   Specimen: BLOOD  Result Value Ref Range Status   Specimen Description BLOOD BLOOD LEFT ARM  Final   Special Requests   Final    BOTTLES DRAWN AEROBIC AND ANAEROBIC Blood Culture results may not be optimal due to an excessive volume of blood received in culture bottles   Culture   Final    NO GROWTH 3 DAYS Performed at [facility], [address], [place], [place] [address]    Report Status PENDING  Incomplete  Blood culture (routine x 2)     Status: None (Preliminary result)   Collection Time: 07/18/22  7:32 PM   Specimen: Left Antecubital; Blood  Result Value Ref Range Status   Specimen Description LEFT ANTECUBITAL  Final   Special Requests   Final    BOTTLES DRAWN AEROBIC AND ANAEROBIC Blood Culture adequate volume   Culture   Final    NO GROWTH 3 DAYS Performed at [facility] [address] [name], [place] [address]    Report Status PENDING  Incomplete         Radiology Studies: No results found.      Scheduled Meds:  busPIRone  15 mg Oral TID   cefadroxil  1,000 mg Oral BID   escitalopram  10 mg Oral Daily    estradiol  1 mg Oral QHS   famotidine  20 mg Oral QHS   feeding supplement  237 mL Oral BID BM   fesoterodine  4 mg Oral Daily   heparin  5,000 Units Subcutaneous Q8H   lamoTRIgine  100 mg Oral Daily   loratadine  10 mg Oral QPM   losartan  50 mg Oral Daily   methocarbamol  500 mg Oral TID   pantoprazole  40 mg Oral BID   polyethylene glycol  17 g Oral Daily   pregabalin  100 mg Oral TID   sodium chloride flush  3 mL Intravenous Q12H   spironolactone  25 mg Oral Daily   topiramate  50 mg Oral BID   Continuous Infusions:  sodium chloride 50 mL/hr at 07/22/22 [id number]     LOS: 3 days     Sidney Ace, MD [facility]   If 7PM-7AM, please contact night-coverage  07/22/2022, 2:07 PM

## 2022-07-22 NOTE — Progress Notes (Signed)
Occupational Therapy Treatment Patient Details Name: [name] [name] MRN: [id number] DOB: [birth year] Today's Date: 07/22/2022   History of present illness [name] [name] is a 3yoF who [facility] c UTI and flu-like symptoms; recent ortho surgery 2/12. 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   OT comments  Upon entering the room, pt supine in bed and reports B LEs with increased pain probably from "the steps yesterday". RN arrived with pain medication at beginning of session. Pt requested to be set up for grooming tasks. Pt declined mobility tasks and toileting needs during this session. Use of towel for B UE strengthening exercises with pt performing 10 reps of bicep curls and chest presses with pt needing rest breaks secondary to fatigue. Pt endorses she has also been doing ankle pumps and glute squeezes while in bed. Pt remain in bed with all needs within reach.    Recommendations for follow up therapy are one component of a multi-disciplinary discharge planning process, led by the attending physician.  Recommendations may be updated based on patient status, additional functional criteria and insurance authorization.    Assistance Recommended at Discharge Intermittent Supervision/Assistance  Patient can return home with the following  A little help with walking and/or transfers;A lot of help with bathing/dressing/bathroom;Assistance with cooking/housework;Direct supervision/assist for medications management;Direct supervision/assist for financial management;Assist for transportation;Help with stairs or ramp for entrance   Equipment Recommendations  None recommended by OT       Precautions / Restrictions Precautions Precautions: Fall Required Braces or Orthoses: Other Brace Other Brace: Rt CAM rocker boot, CL shoe Restrictions Weight Bearing Restrictions: No RUE Weight Bearing: Weight bearing  as tolerated RLE Weight Bearing: Weight bearing as tolerated              ADL either performed or assessed with clinical judgement   ADL Overall ADL's : Needs assistance/impaired     Grooming: Set up;Bed level                                       Vision   Vision Assessment?: No apparent visual deficits          Cognition Arousal/Alertness: Awake/alert Behavior During Therapy: WFL for tasks assessed/performed Overall Cognitive Status: Within Functional Limits for tasks assessed                                                     Pertinent Vitals/ Pain       Pain Assessment Pain Assessment: 0-10 Pain Score: 6  Pain Location: B LEs Pain Descriptors / Indicators: Aching, Discomfort Pain Intervention(s): Limited activity within patient's tolerance, Monitored during session, Repositioned         Frequency  Min 1X/week        Progress Toward Goals  OT Goals(current goals can now be found in the care plan section)  Progress towards OT goals: Progressing toward goals     Plan Discharge plan remains appropriate;Frequency remains appropriate       AM-PAC OT "6 Clicks" Daily Activity     Outcome Measure   Help from another person eating meals?: None Help from another person taking care of personal grooming?: A Little  Help from another person toileting, which includes using toliet, bedpan, or urinal?: A Little Help from another person bathing (including washing, rinsing, drying)?: A Lot Help from another person to put on and taking off regular upper body clothing?: A Little Help from another person to put on and taking off regular lower body clothing?: A Lot 6 Click Score: 17    End of Session    OT Visit Diagnosis: Unsteadiness on feet (R26.81);Other abnormalities of gait and mobility (R26.89);Muscle weakness (generalized) (M62.81);Pain   Activity Tolerance Patient limited by pain   Patient Left in bed;with call  bell/phone within reach;with bed alarm set   Nurse Communication Mobility status        Time: [id number] OT Time Calculation (min): 20 min  Charges: OT General Charges $OT Visit: 1 Visit OT Treatments $Self Care/Home Management : 8-22 mins  Darleen Crocker, MS, OTR/L , CBIS ascom [phone]  07/22/22, 2:46 PM

## 2022-07-23 ENCOUNTER — Other Ambulatory Visit: Payer: Self-pay | Admitting: *Deleted

## 2022-07-23 DIAGNOSIS — A419 Sepsis, unspecified organism: Secondary | ICD-10-CM

## 2022-07-23 DIAGNOSIS — N390 Urinary tract infection, site not specified: Secondary | ICD-10-CM | POA: Diagnosis not present

## 2022-07-23 LAB — CULTURE, BLOOD (ROUTINE X 2)
Culture: NO GROWTH
Culture: NO GROWTH
Special Requests: ADEQUATE

## 2022-07-23 MED ORDER — METHOCARBAMOL 500 MG PO TABS: 500.0000 mg | ORAL_TABLET | Freq: Three times a day (TID) | ORAL | 0 refills | Status: AC

## 2022-07-23 MED ORDER — CEFADROXIL 500 MG PO CAPS: 1000.0000 mg | ORAL_CAPSULE | Freq: Two times a day (BID) | ORAL | 0 refills | Status: AC

## 2022-07-23 NOTE — Progress Notes (Signed)
Mobility Specialist - Progress Note   07/23/22 0949  Mobility  Activity Ambulated with assistance in room  Level of Assistance Standby assist, set-up cues, supervision of patient - no hands on  Assistive Device Front wheel walker  Distance Ambulated (ft) 16 ft  Activity Response Tolerated well  $Mobility charge 1 Mobility   Secretary asked MS to assist Pt from bathroom. Pt sitting on commode upon entry, utilizing RA. Pt STS to RW SBA, stood at commode while MS completed pedi care. Pt amb to bed, left sitting EOB to eat breakfast. Alarm set and needs within reach.   Zetta Bills Mobility Specialist 07/23/22 9:56 AM

## 2022-07-23 NOTE — Progress Notes (Signed)
AVS given and reviewed with pt. Medications discussed. All questions answered to satisfaction. Pt verbalized understanding of information given. Pt escorted off the unit with all belongings via wheelchair by volunteer services.  

## 2022-07-23 NOTE — Plan of Care (Signed)
  Problem: Pain Managment: Goal: General experience of comfort will improve Outcome: Progressing   Problem: Safety: Goal: Ability to remain free from injury will improve Outcome: Progressing   Problem: Skin Integrity: Goal: Risk for impaired skin integrity will decrease Outcome: Progressing   

## 2022-07-23 NOTE — TOC Transition Note (Signed)
Transition of Care [facility]) - CM/SW Discharge Note   Patient Details  Name: [name] [name] MRN: [id number] Date of Birth: Aug 24, 1965  [facility]) CM/SW Contact:  Chapman Fitch, RN Phone Number: 07/23/2022, 12:28 PM   Clinical Narrative:     Patient to discharge today [name] with [facility] notified of discharge Patient states that [employer] has reached out about delivered of DME.   Patient states that a friend will pick her up         Patient Goals and CMS Choice      Discharge Placement                         Discharge Plan and Services Additional resources added to the After Visit Summary for                                       Social Determinants of Health (SDOH) Interventions SDOH Screenings   Food Insecurity: No Food Insecurity (07/19/2022)  Housing: Low Risk  (07/19/2022)  Transportation Needs: No Transportation Needs (07/19/2022)  Utilities: Not At Risk (07/19/2022)  Alcohol Screen: Low Risk  (08/21/2020)  Depression (PHQ2-9): Low Risk  (06/21/2022)  Financial Resource Strain: Low Risk  (07/13/2022)  Physical Activity: Inactive (08/21/2020)  Social Connections: Socially Isolated (08/21/2020)  Stress: No Stress Concern Present (08/21/2020)  Tobacco Use: Low Risk  (07/19/2022)     Readmission Risk Interventions    07/21/2022    2:43 PM 06/25/2022    1:44 PM  Readmission Risk Prevention Plan  Transportation Screening Complete Complete  PCP or Specialist Appt within 3-5 Days  Complete  HRI or Home Care Consult  Complete  Palliative Care Screening  Not Applicable  Medication Review (RN Care Manager) Complete Complete  HRI or Home Care Consult Complete   SW Recovery Care/Counseling Consult Complete   Palliative Care Screening Not Applicable   Skilled Nursing Facility Not Applicable

## 2022-07-23 NOTE — Discharge Summary (Signed)
Physician Discharge Summary  Wenda Overlanderesa D Broadfoot MWU:[id number] DOB: 02-25-1966 DOA: 07/18/2022  PCP: Donita BrooksPickard, Warren T, MD  Admit date: 07/18/2022 Discharge date: 07/23/2022  Admitted From: Home Disposition:  Home with home health  Recommendations for Outpatient Follow-up:  Follow up with PCP in 1-2 weeks   Home Health:Yes PT OT RN Aide  Equipment/Devices:None   Discharge Condition:Stable  CODE STATUS:FULL  Diet recommendation: Reg  Brief/Interim Summary: 57 year old female with anxiety, asthma, bipolar, depression, GERD, hypertension, prediabetes presents to the hospital with not feeling well.  She has some ear pain, burning on urination, left back pain and side pain, nausea, poor appetite and fever.  Admitted with sepsis.   4/1.  Patient still not feeling well.  Started on Rocephin for sepsis likely urinary source.  Solu-Medrol started for left sacroiliac pain. 4/2.  Blood cultures negative for 2 days.  Urine culture only growing out 20,000 E. coli.  Continue Rocephin.  Continue Solu-Medrol. 4/3: Blood cultures remain negative.  Pansensitive E. coli noted on urine culture. 4/4: Slowly improving. 4/5: Completed treatment for cystitis.  Continues to endorse left hip pain.  Robaxin added.  Stable for dc    Discharge Diagnoses:  Principal Problem:   Sepsis secondary to UTI Active Problems:   Acute cystitis with hematuria   Pain of left sacroiliac joint   Hypotension   Chronic diastolic CHF (congestive heart failure)   Acute kidney injury superimposed on CKD   Chronic kidney disease, stage III (moderate)   Anemia of chronic disease   GERD (gastroesophageal reflux disease)   Sleep apnea   Hypokalemia   Hyponatremia  Sepsis secondary to UTI Present on admission, likely urinary source with positive urine analysis.  Blood cultures negative for 3 days.  Urine culture only growing out 20,000 E. coli.  Pansensitive.  Completed IV antibiotics.  No further antibiotics at this time.   Stable for dc   Acute cystitis with hematuria Completed antibiotic course   Pain of left sacroiliac joint Multimodal pain regimen Therapy evaluations Minimize use of narcotics and sedating medications DC home with home health 4/5   Hypotension Improved.  Home antihypertensive regimen improved   Acute kidney injury superimposed on CKD Acute kidney injury on CKD stage IIIa.  Improved.  Creatinine improving   Chronic diastolic CHF (congestive heart failure) Resume home Lasix 40 mg daily     Anemia of chronic disease Suspect anemia of chronic disease in the setting of multiple chronic illnesses.  Hemoglobin relatively stable in the low.  No indication for transfusion currently.   GERD (gastroesophageal reflux disease) Continue PPI   Sleep apnea CPAP   Hyponatremia Sodium now in normal range.   Hypokalemia Replaced  Discharge Instructions  Discharge Instructions     Diet - low sodium heart healthy   Complete by: As directed    Increase activity slowly   Complete by: As directed       Allergies as of 07/23/2022       Reactions   Tetracyclines & Related Other (See Comments)   Syncope and put her "in a coma"   Tramadol Other (See Comments)   Seizures   Oxybutynin Other (See Comments)   Unknown reaction per Mom   Phenazopyridine Other (See Comments)   Unknown reaction   Ciprofloxacin Rash, Itching   Codeine Itching, Rash   Estradiol Rash   Patches broke out the skin        Medication List     TAKE these medications    Aimovig 70  MG/ML Soaj Generic drug: Erenumab-aooe administer 1mL UNDER THE SKIN every 30 DAYS What changed:  how much to take how to take this when to take this additional instructions   albuterol 108 (90 Base) MCG/ACT inhaler Commonly known as: ProAir HFA Inhale 2 puffs = into the lungs every 6 (six) hours as needed for wheezing or shortness of breath. What changed:  how much to take how to take this when to take  this reasons to take this additional instructions   benzonatate 200 MG capsule Commonly known as: TESSALON Take 1 capsule (200 mg total) by mouth 3 (three) times daily as needed for cough.   busPIRone 15 MG tablet Commonly known as: BUSPAR Take 15 mg by mouth 3 (three) times daily.   CALCIUM 1200 PO Take 1,200 mg by mouth daily.   cefadroxil 500 MG capsule Commonly known as: DURICEF Take 2 capsules (1,000 mg total) by mouth 2 (two) times daily for 10 doses.   DULoxetine 20 MG capsule Commonly known as: CYMBALTA Take 1 capsule (20 mg total) by mouth daily.   escitalopram 10 MG tablet Commonly known as: LEXAPRO Take 10 mg by mouth daily.   estradiol 1 MG tablet Commonly known as: ESTRACE TAKE ONE TABLET BY MOUTH EVERYDAY AT BEDTIME What changed: See the new instructions.   famotidine 40 MG tablet Commonly known as: PEPCID TAKE ONE TABLET BY MOUTH EVERYDAY AT BEDTIME What changed: See the new instructions.   feeding supplement Liqd Take 237 mLs by mouth 2 (two) times daily between meals.   furosemide 40 MG tablet Commonly known as: LASIX Take 1 tablet (40 mg total) by mouth daily.   guaiFENesin-dextromethorphan 100-10 MG/5ML syrup Commonly known as: ROBITUSSIN DM Take 5-10 mLs by mouth every 4 (four) hours as needed for cough.   haloperidol 2 MG tablet Commonly known as: HALDOL Take 1 tablet (2 mg total) by mouth at bedtime as needed for agitation (pyschosis).   Iron (Ferrous Sulfate) 325 (65 Fe) MG Tabs Take 325 mg by mouth in the morning and at bedtime.   lamoTRIgine 100 MG tablet Commonly known as: LAMICTAL Take 100 mg by mouth daily.   levocetirizine 5 MG tablet Commonly known as: XYZAL Take 5 mg by mouth every evening.   losartan 50 MG tablet Commonly known as: COZAAR Take 1 tablet (50 mg total) by mouth daily.   methocarbamol 500 MG tablet Commonly known as: ROBAXIN Take 1 tablet (500 mg total) by mouth 3 (three) times daily for 20 days.    multivitamin with minerals tablet Take 1 tablet by mouth daily.   naloxone 4 MG/0.1ML Liqd nasal spray kit Commonly known as: NARCAN Place 0.4 mg into the nose once.   oxyCODONE-acetaminophen 10-325 MG tablet Commonly known as: PERCOCET Take one tablet by mouth up to 4 times daily as needed.   pantoprazole 40 MG tablet Commonly known as: PROTONIX Take 1 tablet (40 mg total) by mouth 2 (two) times daily.   polyethylene glycol 17 g packet Commonly known as: MIRALAX / GLYCOLAX Take 17 g by mouth daily.   pregabalin 100 MG capsule Commonly known as: LYRICA Take 1 capsule (100 mg total) by mouth 2 (two) times daily.   Relistor 150 MG Tabs Generic drug: Methylnaltrexone Bromide Take 450 mg by mouth every morning.   solifenacin 10 MG tablet Commonly known as: VESICARE TAKE ONE TABLET BY MOUTH DAILY   spironolactone 25 MG tablet Commonly known as: ALDACTONE Take 1 tablet (25 mg total) by mouth  daily.   SUMAtriptan 50 MG tablet Commonly known as: IMITREX Take 50 mg by mouth every 2 (two) hours as needed for migraine.   topiramate 50 MG tablet Commonly known as: TOPAMAX TAKE ONE TABLET BY MOUTH TWICE DAILY   traZODone 100 MG tablet Commonly known as: DESYREL Take 1 tablet (100 mg total) by mouth at bedtime.               Durable Medical Equipment  (From admission, onward)           Start     Ordered   07/21/22 1450  For home use only DME Shower stool  Once        07/21/22 1450   07/21/22 1450  For home use only DME Hospital bed  Once       Question Answer Comment  Length of Need Lifetime   Patient has (list medical condition): CHF, chronic pain   The above medical condition requires: Patient requires the ability to reposition frequently   Head must be elevated greater than: 45 degrees   Bed type Semi-electric      07/21/22 1450            Allergies  Allergen Reactions   Tetracyclines & Related Other (See Comments)    Syncope and put her "in  a coma"   Tramadol Other (See Comments)    Seizures   Oxybutynin Other (See Comments)    Unknown reaction per Mom   Phenazopyridine Other (See Comments)    Unknown reaction   Ciprofloxacin Rash and Itching   Codeine Itching and Rash   Estradiol Rash    Patches broke out the skin    Consultations: None   Procedures/Studies: CT ABDOMEN PELVIS W CONTRAST  Result Date: 07/18/2022 CLINICAL DATA:  Abdominal pain, acute, nonlocalized. UTI symptoms. Painful urination. EXAM: CT ABDOMEN AND PELVIS WITH CONTRAST TECHNIQUE: Multidetector CT imaging of the abdomen and pelvis was performed using the standard protocol following bolus administration of intravenous contrast. RADIATION DOSE REDUCTION: This exam was performed according to the departmental dose-optimization program which includes automated exposure control, adjustment of the mA and/or kV according to patient size and/or use of iterative reconstruction technique. CONTRAST:  OMNIPAQUE IOHEXOL 300 MG/ML  SOLN COMPARISON:  06/11/2022 FINDINGS: Lower chest: Lung bases are clear. No pleural or pericardial fluid. Small hiatal hernia. Hepatobiliary: No focal liver lesion. No calcified gallstone. Resolution of previously seen ascites. Pancreas: Normal Spleen: Normal Adrenals/Urinary Tract: Adrenal glands are normal. Kidneys are normal. No cyst, mass, stone or hydronephrosis. Bladder shows wraps mild wall thickening but no advanced finding. Stomach/Bowel: No small bowel abnormality. Clips in the right lower quadrant probably related to previous appendectomy. No abnormal colon finding. Vascular/Lymphatic: Aortic atherosclerosis. No aneurysm. IVC is normal. No adenopathy. Reproductive: Previous hysterectomy.  No pelvic mass. Other: No free fluid or air presently. Resolution of previously seen anasarca. Musculoskeletal: Distant thoracolumbar spinal fusion. No acute bone finding. IMPRESSION: 1. No acute or significant finding. Resolution of previously seen  ascites, pleural effusions and anasarca. 2. Small hiatal hernia. 3. Question mild bladder wall thickening. No definite or advanced bladder abnormality. 4. Aortic atherosclerosis. Aortic Atherosclerosis (ICD10-I70.0). Electronically Signed   By: Paulina Fusi M.D.   On: 07/18/2022 19:09   DG Chest 2 View  Result Date: 07/18/2022 CLINICAL DATA:  Flu like EXAM: CHEST - 2 VIEW COMPARISON:  Chest radiograph dated 07/09/2022 FINDINGS: Normal lung volumes. No focal consolidations. No pleural effusion or pneumothorax. The heart size and mediastinal  contours are within normal limits. Partially imaged thoracolumbar spinal fixation hardware appears intact. Cervical spinal fixation hardware appears intact. IMPRESSION: No active cardiopulmonary disease. Electronically Signed   By: Agustin Cree M.D.   On: 07/18/2022 18:07   US RENAL  Result Date: 07/09/2022 CLINICAL DATA:  Renal dysfunction EXAM: RENAL / URINARY TRACT ULTRASOUND COMPLETE COMPARISON:  06/02/2022 FINDINGS: Right Kidney: Renal measurements: 11.8 x 5.3 x 5.5 cm = volume: 181.1 mL. Echogenicity within normal limits. No mass or hydronephrosis visualized. Left Kidney: Renal measurements: 12.7 x 6.4 x 5.7 cm = volume: 239.9 mL. Echogenicity within normal limits. No mass or hydronephrosis visualized. Bladder: Appears normal for degree of bladder distention. Other: None. IMPRESSION: There is no hydronephrosis. Electronically Signed   By: Ernie Avena M.D.   On: 07/09/2022 14:04   DG Chest 2 View  Result Date: 07/09/2022 CLINICAL DATA:  Cough for several days EXAM: CHEST - 2 VIEW COMPARISON:  X-ray 07/06/2022 FINDINGS: Underinflation. No consolidation, pneumothorax or effusion. No edema. Normal cardiopericardial silhouette. Fixation hardware scattered diffusely along the thoracic spine, lower cervical spine and there is evidence of arthroplasty involving the right shoulder. IMPRESSION: Underinflation.  No acute osseous abnormality. Extensive orthopedic  hardware Electronically Signed   By: Karen Kays M.D.   On: 07/09/2022 12:11   CT Head Wo Contrast  Result Date: 07/06/2022 CLINICAL DATA:  Motor neuron disease suspected EXAM: CT HEAD WITHOUT CONTRAST TECHNIQUE: Contiguous axial images were obtained from the base of the skull through the vertex without intravenous contrast. RADIATION DOSE REDUCTION: This exam was performed according to the departmental dose-optimization program which includes automated exposure control, adjustment of the mA and/or kV according to patient size and/or use of iterative reconstruction technique. COMPARISON:  CT head June 07, 2022. FINDINGS: Brain: No evidence of acute infarction, hemorrhage, hydrocephalus, extra-axial collection or mass lesion/mass effect. Vascular: No hyperdense vessel. Skull: No acute fracture. Sinuses/Orbits: Largely clear sinuses.  No acute orbital findings. Other: No mastoid effusions. IMPRESSION: No acute abnormality. Electronically Signed   By: Feliberto Harts M.D.   On: 07/06/2022 15:47   DG Chest Port 1 View  Result Date: 07/06/2022 CLINICAL DATA:  Cough and weakness. EXAM: PORTABLE CHEST 1 VIEW COMPARISON:  06/24/2022. FINDINGS: Clear lungs. Normal heart size and mediastinal contours. No pleural effusion or pneumothorax. Spinal fusion hardware is again noted. IMPRESSION: No evidence of acute cardiopulmonary disease. Electronically Signed   By: Orvan Falconer M.D.   On: 07/06/2022 15:33   ECHOCARDIOGRAM COMPLETE  Result Date: 06/25/2022    ECHOCARDIOGRAM REPORT   Patient Name:   ANASTAYSIA SHADBURN Date of Exam: 06/25/2022 Medical Rec #:  [id number]        Height:       64.0 in Accession #:    [id number]       Weight:       246.9 lb Date of Birth:  August 02, 1965        BSA:          2.140 m Patient Age:    56 years         BP:           158/92 mmHg Patient Gender: F                HR:           57 bpm. Exam Location:  Inpatient Procedure: 2D Echo, Color Doppler and Cardiac Doppler Indications:     congestive heart failure  History:  Patient has prior history of Echocardiogram examinations, most                 recent 08/26/2019. CHF, Arrythmias:LBBB; Signs/Symptoms:Shortness                 of Breath.  Sonographer:    Lucendia Herrlich Referring Phys: [id number] AVWUJW POKHREL IMPRESSIONS  1. Left ventricular ejection fraction, by estimation, is 55 to 60%. The left ventricle has normal function. The left ventricle has no regional wall motion abnormalities. Left ventricular diastolic parameters were normal.  2. Right ventricular systolic function is normal. The right ventricular size is normal.  3. Left atrial size was mildly dilated.  4. The mitral valve is normal in structure. Trivial mitral valve regurgitation. No evidence of mitral stenosis.  5. The aortic valve is normal in structure. Aortic valve regurgitation is not visualized. No aortic stenosis is present.  6. The inferior vena cava is normal in size with greater than 50% respiratory variability, suggesting right atrial pressure of 3 mmHg. FINDINGS  Left Ventricle: Left ventricular ejection fraction, by estimation, is 55 to 60%. The left ventricle has normal function. The left ventricle has no regional wall motion abnormalities. The left ventricular internal cavity size was normal in size. There is  no left ventricular hypertrophy. Left ventricular diastolic parameters were normal. Right Ventricle: The right ventricular size is normal. No increase in right ventricular wall thickness. Right ventricular systolic function is normal. Left Atrium: Left atrial size was mildly dilated. Right Atrium: Right atrial size was normal in size. Pericardium: There is no evidence of pericardial effusion. Mitral Valve: The mitral valve is normal in structure. Mild mitral annular calcification. Trivial mitral valve regurgitation. No evidence of mitral valve stenosis. Tricuspid Valve: The tricuspid valve is normal in structure. Tricuspid valve regurgitation is mild . No  evidence of tricuspid stenosis. Aortic Valve: The aortic valve is normal in structure. Aortic valve regurgitation is not visualized. No aortic stenosis is present. Pulmonic Valve: The pulmonic valve was normal in structure. Pulmonic valve regurgitation is not visualized. No evidence of pulmonic stenosis. Aorta: The aortic root is normal in size and structure. Venous: The inferior vena cava is normal in size with greater than 50% respiratory variability, suggesting right atrial pressure of 3 mmHg. IAS/Shunts: No atrial level shunt detected by color flow Doppler.  LEFT VENTRICLE PLAX 2D LVOT diam:     2.20 cm   Diastology LV SV:         102       LV e' medial:    10.10 cm/s LV SV Index:   48        LV E/e' medial:  11.7 LVOT Area:     3.80 cm  LV e' lateral:   10.10 cm/s                          LV E/e' lateral: 11.7                           3D Volume EF:                          3D EF:        62 %                          LV EDV:  185 ml                          LV ESV:       71 ml                          LV SV:        114 ml RIGHT VENTRICLE             IVC RV S prime:     13.60 cm/s  IVC diam: 2.40 cm TAPSE (M-mode): 2.3 cm LEFT ATRIUM             Index        RIGHT ATRIUM           Index LA Vol (A2C):   57.5 ml 26.87 ml/m  RA Area:     15.15 cm LA Vol (A4C):   55.1 ml 25.75 ml/m  RA Volume:   33.30 ml  15.56 ml/m LA Biplane Vol: 58.7 ml 27.43 ml/m  AORTIC VALVE LVOT Vmax:   134.67 cm/s LVOT Vmean:  90.300 cm/s LVOT VTI:    0.268 m  AORTA Ao Root diam: 3.40 cm Ao Asc diam:  3.50 cm MITRAL VALVE                TRICUSPID VALVE MV Area (PHT): 4.21 cm     TR Peak grad:   28.5 mmHg MV Decel Time: 180 msec     TR Vmax:        267.00 cm/s MV E velocity: 118.00 cm/s MV A velocity: 83.20 cm/s   SHUNTS MV E/A ratio:  1.42         Systemic VTI:  0.27 m                             Systemic Diam: 2.20 cm Arvilla Meres MD Electronically signed by Arvilla Meres MD Signature Date/Time: 06/25/2022/4:24:47 PM     Final    CT Angio Chest PE W and/or Wo Contrast  Result Date: 06/24/2022 CLINICAL DATA:  Pulmonary embolism (PE) suspected, high prob Shortness of breath. EXAM: CT ANGIOGRAPHY CHEST WITH CONTRAST TECHNIQUE: Multidetector CT imaging of the chest was performed using the standard protocol during bolus administration of intravenous contrast. Multiplanar CT image reconstructions and MIPs were obtained to evaluate the vascular anatomy. RADIATION DOSE REDUCTION: This exam was performed according to the departmental dose-optimization program which includes automated exposure control, adjustment of the mA and/or kV according to patient size and/or use of iterative reconstruction technique. CONTRAST:  75mL OMNIPAQUE IOHEXOL 350 MG/ML SOLN COMPARISON:  Radiograph earlier today. Chest CTA 09/23/2016 FINDINGS: Cardiovascular: Technically limited exam due to breathing motion, streak artifact from spinal and right arm hardware and soft tissue attenuation from habitus. Allowing for these limitations, there is no obvious pulmonary embolus. Normal caliber thoracic aorta without dissection. Heart is upper normal in size. No pericardial effusion. Mediastinum/Nodes: Scattered small mediastinal and bilateral hilar lymph nodes, not enlarged by size criteria. Minimal hiatal hernia. Lungs/Pleura: Small to moderate bilateral pleural effusions with adjacent compressive atelectasis. Smooth septal thickening and ill-defined ground-glass typical of pulmonary edema. No pneumothorax. Upper Abdomen: No acute findings. Large volume of stool in the included colon. Musculoskeletal: Reverse right shoulder arthroplasty. Extensive posterior thoracolumbar fusion hardware. Anterior fusion hardware in the lower cervical spine. No acute findings. Review of the MIP images confirms the above  findings. IMPRESSION: 1. No obvious pulmonary embolus allowing for limitations as described above. 2. Small to moderate bilateral pleural effusions with adjacent  compressive atelectasis. Smooth septal thickening and ill-defined ground-glass typical of pulmonary edema. Electronically Signed   By: Narda Rutherford M.D.   On: 06/24/2022 17:53   VAS [place] LOWER EXTREMITY VENOUS (DVT)  Result Date: 06/24/2022  Lower Venous DVT Study Patient Name:  TIMBERLYN PICKFORD  Date of Exam:   06/24/2022 Medical Rec #: [id number]         Accession #:    [id number] Date of Birth: Nov 29, 1965         Patient Gender: F Patient Age:   37 years Exam Location:  [facility] Procedure:      VAS [place] LOWER EXTREMITY VENOUS (DVT) Referring Phys: Benjiman Core --------------------------------------------------------------------------------  Indications: Pitting edema, and Pain.  Comparison Study: No prior study on file Performing Technologist: Sherren Kerns RVS  Examination Guidelines: A complete evaluation includes B-mode imaging, spectral Doppler, color Doppler, and power Doppler as needed of all accessible portions of each vessel. Bilateral testing is considered an integral part of a complete examination. Limited examinations for reoccurring indications may be performed as noted. The reflux portion of the exam is performed with the patient in reverse Trendelenburg.  +-----+---------------+---------+-----------+----------+--------------+ RIGHTCompressibilityPhasicitySpontaneityPropertiesThrombus Aging +-----+---------------+---------+-----------+----------+--------------+ CFV  Full           Yes      Yes                                 +-----+---------------+---------+-----------+----------+--------------+   +---------+---------------+---------+-----------+----------+---------------+ LEFT     CompressibilityPhasicitySpontaneityPropertiesThrombus Aging  +---------+---------------+---------+-----------+----------+---------------+ CFV      Full           Yes      Yes                                   +---------+---------------+---------+-----------+----------+---------------+ SFJ      Full                                                         +---------+---------------+---------+-----------+----------+---------------+ FV Prox  Full                                                         +---------+---------------+---------+-----------+----------+---------------+ FV Mid   Full           Yes      Yes                                  +---------+---------------+---------+-----------+----------+---------------+ FV DistalFull                                                         +---------+---------------+---------+-----------+----------+---------------+ PFV      Full                                                         +---------+---------------+---------+-----------+----------+---------------+  POP      Full           Yes      Yes                                  +---------+---------------+---------+-----------+----------+---------------+ PTV                                                   patent by color +---------+---------------+---------+-----------+----------+---------------+ PERO                                                  patent by color +---------+---------------+---------+-----------+----------+---------------+    Summary: RIGHT: - No evidence of common femoral vein obstruction.  LEFT: - There is no evidence of deep vein thrombosis in the lower extremity.  - No cystic structure found in the popliteal fossa.  *See table(s) above for measurements and observations. Electronically signed by Gerarda FractionJoshua Robins on 06/24/2022 at 4:17:52 PM.    Final    DG Chest 2 View  Result Date: 06/24/2022 CLINICAL DATA:  Shortness of breath. EXAM: CHEST - 2 VIEW COMPARISON:  May 22, 2022. FINDINGS: Stable cardiomediastinal silhouette. Status post surgical posterior fusion of thoracic spine. Minimal bibasilar subsegmental atelectasis or edema is noted with  small pleural effusions. IMPRESSION: Minimal bibasilar subsegmental atelectasis or edema with small pleural effusions. Electronically Signed   By: Lupita RaiderJames  Green Jr M.D.   On: 06/24/2022 12:34      Subjective: Seen and examined on the day of discharge.  Stable no distress.  Appropriate for discharge home.  Discharge Exam: Vitals:   07/23/22 0517 07/23/22 0844  BP: 136/79 128/65  Pulse: 64 61  Resp: 15 18  Temp: 97.7 F (36.5 C) 97.9 F (36.6 C)  SpO2: 100% 97%   Vitals:   07/22/22 1957 07/23/22 0500 07/23/22 0517 07/23/22 0844  BP: 126/76  136/79 128/65  Pulse: 66  64 61  Resp: 17  15 18   Temp: 97.8 F (36.6 C)  97.7 F (36.5 C) 97.9 F (36.6 C)  TempSrc: Oral  Oral Oral  SpO2: 100%  100% 97%  Weight:  101 kg    Height:        General: Pt is alert, awake, not in acute distress Cardiovascular: RRR, S1/S2 +, no rubs, no gallops Respiratory: CTA bilaterally, no wheezing, no rhonchi Abdominal: Soft, NT, ND, bowel sounds + Extremities: no edema, no cyanosis    The results of significant diagnostics from this hospitalization (including imaging, microbiology, ancillary and laboratory) are listed below for reference.     Microbiology: Recent Results (from the past 240 hour(s))  Resp panel by RT-PCR (RSV, Flu A&B, Covid) Urine, Clean Catch     Status: None   Collection Time: 07/18/22  7:12 PM   Specimen: Urine, Clean Catch; Nasal Swab  Result Value Ref Range Status   SARS Coronavirus 2 by RT PCR NEGATIVE NEGATIVE Final    Comment: (NOTE) SARS-CoV-2 target nucleic acids are NOT DETECTED.  The SARS-CoV-2 RNA is generally detectable in upper respiratory specimens during the acute phase of infection. The lowest concentration of SARS-CoV-2 viral copies this assay can detect is  138 copies/mL. A negative result does not preclude SARS-Cov-2 infection and should not be used as the sole basis for treatment or other patient management decisions. A negative result may occur with   improper specimen collection/handling, submission of specimen other than nasopharyngeal swab, presence of viral mutation(s) within the areas targeted by this assay, and inadequate number of viral copies(<138 copies/mL). A negative result must be combined with clinical observations, patient history, and epidemiological information. The expected result is Negative.  Fact Sheet for Patients:  BloggerCourse.com  Fact Sheet for Healthcare Providers:  SeriousBroker.it  This test is no t yet approved or cleared by the [place] FDA and  has been authorized for detection and/or diagnosis of SARS-CoV-2 by FDA under an Emergency Use Authorization (EUA). This EUA will remain  in effect (meaning this test can be used) for the duration of the COVID-19 declaration under Section 564(b)(1) of the Act, 21 U.S.C.section 360bbb-3(b)(1), unless the authorization is terminated  or revoked sooner.       Influenza A by PCR NEGATIVE NEGATIVE Final   Influenza B by PCR NEGATIVE NEGATIVE Final    Comment: (NOTE) The Xpert Xpress SARS-CoV-2/FLU/RSV plus assay is intended as an aid in the diagnosis of influenza from Nasopharyngeal swab specimens and should not be used as a sole basis for treatment. Nasal washings and aspirates are unacceptable for Xpert Xpress SARS-CoV-2/FLU/RSV testing.  Fact Sheet for Patients: BloggerCourse.com  Fact Sheet for Healthcare Providers: SeriousBroker.it  This test is not yet approved or cleared by the [place] FDA and has been authorized for detection and/or diagnosis of SARS-CoV-2 by FDA under an Emergency Use Authorization (EUA). This EUA will remain in effect (meaning this test can be used) for the duration of the COVID-19 declaration under Section 564(b)(1) of the Act, 21 U.S.C. section 360bbb-3(b)(1), unless the authorization is terminated or revoked.      Resp Syncytial Virus by PCR NEGATIVE NEGATIVE Final    Comment: (NOTE) Fact Sheet for Patients: BloggerCourse.com  Fact Sheet for Healthcare Providers: SeriousBroker.it  This test is not yet approved or cleared by the [place] FDA and has been authorized for detection and/or diagnosis of SARS-CoV-2 by FDA under an Emergency Use Authorization (EUA). This EUA will remain in effect (meaning this test can be used) for the duration of the COVID-19 declaration under Section 564(b)(1) of the Act, 21 U.S.C. section 360bbb-3(b)(1), unless the authorization is terminated or revoked.  Performed at [facility], [address], [place], [place] [address]   Urine Culture     Status: Abnormal   Collection Time: 07/18/22  7:12 PM   Specimen: Urine, Clean Catch  Result Value Ref Range Status   Specimen Description   Final    URINE, CLEAN CATCH Performed at [facility], [address] [place], [place] [address]    Special Requests   Final    NONE Performed at [facility], [address] [place], [place] [address]    Culture 20,000 COLONIES/mL ESCHERICHIA COLI (A)  Final   Report Status 07/21/2022 FINAL  Final   Organism ID, Bacteria ESCHERICHIA COLI (A)  Final      Susceptibility   Escherichia coli - MIC*    AMPICILLIN <=2 SENSITIVE Sensitive     CEFAZOLIN <=4 SENSITIVE Sensitive     CEFEPIME <=0.12 SENSITIVE Sensitive     CEFTRIAXONE <=0.25 SENSITIVE Sensitive     CIPROFLOXACIN <=0.25 SENSITIVE Sensitive     GENTAMICIN <=1 SENSITIVE Sensitive  IMIPENEM <=0.25 SENSITIVE Sensitive     NITROFURANTOIN <=16 SENSITIVE Sensitive     TRIMETH/SULFA <=20 SENSITIVE Sensitive     AMPICILLIN/SULBACTAM <=2 SENSITIVE Sensitive     PIP/TAZO <=4 SENSITIVE Sensitive     * 20,000 COLONIES/mL ESCHERICHIA COLI  Blood culture (routine x 2)     Status: None   Collection Time: 07/18/22  7:32 PM   Specimen: BLOOD   Result Value Ref Range Status   Specimen Description BLOOD BLOOD LEFT ARM  Final   Special Requests   Final    BOTTLES DRAWN AEROBIC AND ANAEROBIC Blood Culture results may not be optimal due to an excessive volume of blood received in culture bottles   Culture   Final    NO GROWTH 5 DAYS Performed at [facility], [address] [place], [place] [address]    Report Status 07/23/2022 FINAL  Final  Blood culture (routine x 2)     Status: None   Collection Time: 07/18/22  7:32 PM   Specimen: Left Antecubital; Blood  Result Value Ref Range Status   Specimen Description LEFT ANTECUBITAL  Final   Special Requests   Final    BOTTLES DRAWN AEROBIC AND ANAEROBIC Blood Culture adequate volume   Culture   Final    NO GROWTH 5 DAYS Performed at [facility], [address] [place], [place] [address]    Report Status 07/23/2022 FINAL  Final     Labs: BNP (last 3 results) Recent Labs    06/18/22 1706 06/24/22 1219  BNP 259* 247.5*   Basic Metabolic Panel: Recent Labs  Lab 07/18/22 1733 07/19/22 0522 07/20/22 0422 07/21/22 0430 07/22/22 0721  NA 131* 132* 135 136 139  K 3.9 3.4* 4.3 4.4 4.0  CL 102 103 107 107 111  CO2 21* 19* 19* 21* 22  GLUCOSE 108* 116* 121* 120* 101*  BUN 26* 29* 37* 49* 43*  CREATININE 1.28* 1.44* 1.67* 1.55* 1.27*  CALCIUM 8.8* 8.5* 8.7* 8.6* 8.8*   Liver Function Tests: Recent Labs  Lab 07/18/22 1733 07/19/22 0522 07/21/22 0430  AST 18 16 25   ALT 12 12 22   ALKPHOS 87 81 62  BILITOT 0.8 0.8 0.3  PROT 7.2 6.8 6.5  ALBUMIN 3.3* 3.2* 2.9*   No results for input(s): "LIPASE", "AMYLASE" in the last 168 hours. No results for input(s): "AMMONIA" in the last 168 hours. CBC: Recent Labs  Lab 07/18/22 1733 07/19/22 0522 07/20/22 0422 07/21/22 0430 07/22/22 0721  WBC 12.7* 19.9* 15.5* 10.1 8.7  NEUTROABS 10.0*  --   --   --  5.2  HGB 8.6* 8.4* 8.6* 7.6* 7.4*  HCT 26.1* 25.3* 26.6* 23.3* 23.3*  MCV 93.2 93.7 94.0 94.3  95.5  PLT 260 251 226 226 232   Cardiac Enzymes: No results for input(s): "CKTOTAL", "CKMB", "CKMBINDEX", "TROPONINI" in the last 168 hours. BNP: Invalid input(s): "POCBNP" CBG: No results for input(s): "GLUCAP" in the last 168 hours. D-Dimer No results for input(s): "DDIMER" in the last 72 hours. Hgb A1c No results for input(s): "HGBA1C" in the last 72 hours. Lipid Profile No results for input(s): "CHOL", "HDL", "LDLCALC", "TRIG", "CHOLHDL", "LDLDIRECT" in the last 72 hours. Thyroid function studies No results for input(s): "TSH", "T4TOTAL", "T3FREE", "THYROIDAB" in the last 72 hours.  Invalid input(s): "FREET3" Anemia work up Recent Labs    07/21/22 0921  FERRITIN 224  TIBC 228*  IRON 103   Urinalysis    Component Value Date/Time   COLORURINE YELLOW (A)  07/18/2022 1912   APPEARANCEUR CLOUDY (A) 07/18/2022 1912   LABSPEC 1.017 07/18/2022 1912   PHURINE 5.0 07/18/2022 1912   GLUCOSEU NEGATIVE 07/18/2022 1912   HGBUR LARGE (A) 07/18/2022 1912   BILIRUBINUR NEGATIVE 07/18/2022 1912   KETONESUR NEGATIVE 07/18/2022 1912   PROTEINUR >=300 (A) 07/18/2022 1912   UROBILINOGEN 0.2 06/23/2017 1108   NITRITE NEGATIVE 07/18/2022 1912   LEUKOCYTESUR MODERATE (A) 07/18/2022 1912   Sepsis Labs Recent Labs  Lab 07/19/22 0522 07/20/22 0422 07/21/22 0430 07/22/22 0721  WBC 19.9* 15.5* 10.1 8.7   Microbiology Recent Results (from the past 240 hour(s))  Resp panel by RT-PCR (RSV, Flu A&B, Covid) Urine, Clean Catch     Status: None   Collection Time: 07/18/22  7:12 PM   Specimen: Urine, Clean Catch; Nasal Swab  Result Value Ref Range Status   SARS Coronavirus 2 by RT PCR NEGATIVE NEGATIVE Final    Comment: (NOTE) SARS-CoV-2 target nucleic acids are NOT DETECTED.  The SARS-CoV-2 RNA is generally detectable in upper respiratory specimens during the acute phase of infection. The lowest concentration of SARS-CoV-2 viral copies this assay can detect is 138 copies/mL. A negative  result does not preclude SARS-Cov-2 infection and should not be used as the sole basis for treatment or other patient management decisions. A negative result may occur with  improper specimen collection/handling, submission of specimen other than nasopharyngeal swab, presence of viral mutation(s) within the areas targeted by this assay, and inadequate number of viral copies(<138 copies/mL). A negative result must be combined with clinical observations, patient history, and epidemiological information. The expected result is Negative.  Fact Sheet for Patients:  BloggerCourse.com  Fact Sheet for Healthcare Providers:  SeriousBroker.it  This test is no t yet approved or cleared by the [place] FDA and  has been authorized for detection and/or diagnosis of SARS-CoV-2 by FDA under an Emergency Use Authorization (EUA). This EUA will remain  in effect (meaning this test can be used) for the duration of the COVID-19 declaration under Section 564(b)(1) of the Act, 21 U.S.C.section 360bbb-3(b)(1), unless the authorization is terminated  or revoked sooner.       Influenza A by PCR NEGATIVE NEGATIVE Final   Influenza B by PCR NEGATIVE NEGATIVE Final    Comment: (NOTE) The Xpert Xpress SARS-CoV-2/FLU/RSV plus assay is intended as an aid in the diagnosis of influenza from Nasopharyngeal swab specimens and should not be used as a sole basis for treatment. Nasal washings and aspirates are unacceptable for Xpert Xpress SARS-CoV-2/FLU/RSV testing.  Fact Sheet for Patients: BloggerCourse.com  Fact Sheet for Healthcare Providers: SeriousBroker.it  This test is not yet approved or cleared by the [place] FDA and has been authorized for detection and/or diagnosis of SARS-CoV-2 by FDA under an Emergency Use Authorization (EUA). This EUA will remain in effect (meaning this test can be used)  for the duration of the COVID-19 declaration under Section 564(b)(1) of the Act, 21 U.S.C. section 360bbb-3(b)(1), unless the authorization is terminated or revoked.     Resp Syncytial Virus by PCR NEGATIVE NEGATIVE Final    Comment: (NOTE) Fact Sheet for Patients: BloggerCourse.com  Fact Sheet for Healthcare Providers: SeriousBroker.it  This test is not yet approved or cleared by the [place] FDA and has been authorized for detection and/or diagnosis of SARS-CoV-2 by FDA under an Emergency Use Authorization (EUA). This EUA will remain in effect (meaning this test can be used) for the duration of the COVID-19 declaration under Section 564(b)(1) of the  Act, 21 U.S.C. section 360bbb-3(b)(1), unless the authorization is terminated or revoked.  Performed at [facility], [address] [place], [place] [address]   Urine Culture     Status: Abnormal   Collection Time: 07/18/22  7:12 PM   Specimen: Urine, Clean Catch  Result Value Ref Range Status   Specimen Description   Final    URINE, CLEAN CATCH Performed at [facility], [address], [place], [place] [address]    Special Requests   Final    NONE Performed at [facility], [address] [place], [place] [address]    Culture 20,000 COLONIES/mL ESCHERICHIA COLI (A)  Final   Report Status 07/21/2022 FINAL  Final   Organism ID, Bacteria ESCHERICHIA COLI (A)  Final      Susceptibility   Escherichia coli - MIC*    AMPICILLIN <=2 SENSITIVE Sensitive     CEFAZOLIN <=4 SENSITIVE Sensitive     CEFEPIME <=0.12 SENSITIVE Sensitive     CEFTRIAXONE <=0.25 SENSITIVE Sensitive     CIPROFLOXACIN <=0.25 SENSITIVE Sensitive     GENTAMICIN <=1 SENSITIVE Sensitive     IMIPENEM <=0.25 SENSITIVE Sensitive     NITROFURANTOIN <=16 SENSITIVE Sensitive     TRIMETH/SULFA <=20 SENSITIVE Sensitive     AMPICILLIN/SULBACTAM <=2 SENSITIVE Sensitive     PIP/TAZO  <=4 SENSITIVE Sensitive     * 20,000 COLONIES/mL ESCHERICHIA COLI  Blood culture (routine x 2)     Status: None   Collection Time: 07/18/22  7:32 PM   Specimen: BLOOD  Result Value Ref Range Status   Specimen Description BLOOD BLOOD LEFT ARM  Final   Special Requests   Final    BOTTLES DRAWN AEROBIC AND ANAEROBIC Blood Culture results may not be optimal due to an excessive volume of blood received in culture bottles   Culture   Final    NO GROWTH 5 DAYS Performed at [facility], [address], [place], [place] [address]    Report Status 07/23/2022 FINAL  Final  Blood culture (routine x 2)     Status: None   Collection Time: 07/18/22  7:32 PM   Specimen: Left Antecubital; Blood  Result Value Ref Range Status   Specimen Description LEFT ANTECUBITAL  Final   Special Requests   Final    BOTTLES DRAWN AEROBIC AND ANAEROBIC Blood Culture adequate volume   Culture   Final    NO GROWTH 5 DAYS Performed at [facility], [address], [place], [place] [address]    Report Status 07/23/2022 FINAL  Final     Time coordinating discharge: Over 30 minutes  SIGNED:   Tresa Moore, MD  [facility] 07/23/2022, 1:12 PM Pager   If 7PM-7AM, please contact night-coverage

## 2022-07-26 ENCOUNTER — Encounter: Payer: 59 | Admitting: Podiatry

## 2022-07-26 ENCOUNTER — Telehealth: Payer: Self-pay | Admitting: *Deleted

## 2022-07-26 ENCOUNTER — Encounter: Payer: Self-pay | Admitting: *Deleted

## 2022-07-26 NOTE — Progress Notes (Signed)
  Care Coordination  Note  07/26/2022 Name: [name] [name] MRN: [id number] DOB: 06/07/1965  [name] [name] is a 57 y.o. year old primary care patient of Pickard, Priscille Heidelberg, MD. I reached out to [name] by phone today to assist with scheduling a follow up appointment. [name] verbally consented to my assistance.       Follow up plan: Hospital Follow Up appointment scheduled with Dr. Tanya Nones on 07/30/22 at 12:00.  [facility]  Care Coordination Care Guide  Direct Dial: [phone]

## 2022-07-26 NOTE — Transitions of Care (Post Inpatient/ED Visit) (Signed)
   07/26/2022  Name: [name] [name] MRN: [id number] DOB: 10/21/1965  Today's TOC FU Call Status: Today's TOC FU Call Status:: Successful TOC FU Call Competed TOC FU Call Complete Date: 07/26/22  Transition Care Management Follow-up Telephone Call Date of Discharge: 07/23/22 Discharge Facility: [facility]) Type of Discharge: Inpatient Admission Primary Inpatient Discharge Diagnosis:: Sepsis secondary to UTI How have you been since you were released from the hospital?: Better Any questions or concerns?: No  Items Reviewed: Did you receive and understand the discharge instructions provided?: Yes Medications obtained and verified?: Yes (Medications Reviewed) (reviewed and reconiled) Any new allergies since your discharge?: No Dietary orders reviewed?: Yes Type of Diet Ordered:: low sodium/heart healthy Do you have support at home?: Yes People in Home: child(ren), adult Name of Support/Comfort Primary Source: [name] [name] (son)  Home Care and Equipment/Supplies: Were Home Health Services Ordered?: Yes Name of Home Health Agency:: [facility] Has Agency set up a time to come to your home?: No EMR reviewed for Home Health Orders: Orders present/patient has not received call (refer to CM for follow-up) (Patient has contact number [phone]) and plans to call) Any new equipment or medical supplies ordered?: Yes Name of Medical supply agency?: [facility] Were you able to get the equipment/medical supplies?: Yes (Shower stool, hospital bed are being delivered on 07/28/22) Do you have any questions related to the use of the equipment/supplies?: No  Functional Questionnaire: Do you need assistance with bathing/showering or dressing?: Yes Do you need assistance with meal preparation?: Yes Do you need assistance with eating?: No Do you have difficulty maintaining continence: No Do you need assistance with getting out of bed/getting out of a chair/moving?: No Do you have  difficulty managing or taking your medications?: No  Follow up appointments reviewed: PCP Follow-up appointment confirmed?: Yes Date of PCP follow-up appointment?: 07/30/22 Follow-up Provider: [facility] Follow-up appointment confirmed?: NA Do you need transportation to your follow-up appointment?: No Do you understand care options if your condition(s) worsen?: Yes-patient verbalized understanding  SDOH Interventions Today    Flowsheet Row Most Recent Value  SDOH Interventions   Transportation Interventions Intervention Not Indicated  Physical Activity Interventions Other (Comments)  [Home health PT and OT ordered at hospital discharge on 07/23/22]      Interventions Today    Flowsheet Row Most Recent Value  Chronic Disease   Chronic disease during today's visit Other  [Hospitalization for Sepsis secondary to UTI]  Exercise Interventions   Exercise Discussed/Reviewed Physical Activity  Physical Activity Discussed/Reviewed Physical Activity Discussed, Physical Activity Reviewed  Education Interventions   Education Provided Provided Education  Provided Verbal Education On When to see the doctor, Medication  Nutrition Interventions   Nutrition Discussed/Reviewed Nutrition Reviewed  Pharmacy Interventions   Pharmacy Dicussed/Reviewed Medications and their functions      TOC Interventions Today    Flowsheet Row Most Recent Value  TOC Interventions   TOC Interventions Discussed/Reviewed Arranged PCP follow up within 7 days/Care Guide scheduled, TOC Interventions Discussed, TOC Interventions Reviewed      [name] [name], BSN, RN-BC RN Care Coordinator [facility]  [facility] Direct Dial: [phone] Main #: [phone]

## 2022-07-27 ENCOUNTER — Ambulatory Visit: Payer: Self-pay | Admitting: *Deleted

## 2022-07-27 NOTE — Patient Outreach (Signed)
  Care Coordination   07/27/2022 Name: [name] [name] MRN: [id number] DOB: Nov 06, 1965   Care Coordination Outreach Attempts:  An unsuccessful telephone outreach was attempted today to offer the patient information about available care coordination services as a benefit of their health plan.   Follow Up Plan:  Additional outreach attempts will be made to offer the patient care coordination information and services.   Encounter Outcome:  No Answer   Care Coordination Interventions:  No, not indicated    Solaris Kram L. Noelle Penner, RN, BSN, CCM [facility] Care Management Community Coordinator Office number [phone]

## 2022-07-27 NOTE — Patient Outreach (Addendum)
  Care Coordination   Initial Visit Note   07/27/2022 Name: ARFA WEISSMAN MRN: [id number] DOB: 1965/08/27  LILLIONA CANALS is a 57 y.o. year old female who sees Pickard, Priscille Heidelberg, MD for primary care. I spoke with  [name] by phone today.  What matters to the patients health and wellness today?  None, Denies any medical and social concerns throughout the initial assessment  She denies weight gain, edema, shortness of breath, migraines, chest pain, coping concerns, medication needs  Confirms she needs to make an appointment to manage her CPAP treatment. She has yet to receive her home CPAP after her sleep study was completed. She reports she will call her provider about this.  Denies further pyelonephritis symptoms   Follow up with pcp on 07/30/22    Goals Addressed             This Visit's Progress    [facility] care coordination services       Interventions Today    Flowsheet Row Most Recent Value  Chronic Disease   Chronic disease during today's visit Congestive Heart Failure (CHF), Other  [assessed for needs denied worsening symptoms but does not have her CPAP yet- she will call provider]  General Interventions   General Interventions Discussed/Reviewed General Interventions Discussed, Durable Medical Equipment (DME), Community Resources, Doctor Visits  [Reviewed [facility] services, inquired about CPAP machine, Discussed hospital follow up visits, Confirmed 07/30/22 visit to pcp]  Doctor Visits Discussed/Reviewed Doctor Visits Discussed, PCP, Specialist  [confirmed she will call specialist about her CPAP]  Durable Medical Equipment (DME) Other  [CPAP]  PCP/Specialist Visits Compliance with follow-up visit  [encouraged compliance]  Education Interventions   Education Provided Provided Web-based Education, Provided Education  [action plasns for asthma, chf, info on pyelonephritis, fluid content in foods, containers]  Provided Verbal Education On Sick Day Rules, Holiday representative plans sent via [employer] for major medical conditions Asthma, CHF]  Mental Health Interventions   Mental Health Discussed/Reviewed Mental Health Discussed, Other  [coping since discharge]  Pharmacy Interventions   Pharmacy Dicussed/Reviewed Pharmacy Topics Discussed, Affording Medications  Safety Interventions   Safety Discussed/Reviewed Safety Discussed, Home Safety              SDOH assessments and interventions completed:  Yes  SDOH Interventions Today    Flowsheet Row Most Recent Value  SDOH Interventions   Food Insecurity Interventions Intervention Not Indicated  Housing Interventions Intervention Not Indicated  Transportation Interventions Intervention Not Indicated  Utilities Interventions Intervention Not Indicated  Financial Strain Interventions Intervention Not Indicated  Stress Interventions Intervention Not Indicated        Care Coordination Interventions:  Yes, provided   Follow up plan:  08/26/22  Encounter Outcome:  Pt. Visit Completed   Amylia Collazos L. Noelle Penner, RN, BSN, CCM [facility] Care Management Community Coordinator Office number [phone]

## 2022-07-27 NOTE — Patient Instructions (Addendum)
Visit Information  Thank you for taking time to visit with me today. Please don't hesitate to contact me if I can be of assistance to you.   Following are the goals we discussed today:   Goals Addressed             This Visit's Progress    [facility] care coordination services       Interventions Today    Flowsheet Row Most Recent Value  Chronic Disease   Chronic disease during today's visit Congestive Heart Failure (CHF), Other  [assessed for needs denied worsening symptoms but does not have her CPAP yet- she will call provider]  General Interventions   General Interventions Discussed/Reviewed General Interventions Discussed, Durable Medical Equipment (DME), Community Resources, Doctor Visits  [Reviewed [facility] services, inquired about CPAP machine, Discussed hospital follow up visits, Confirmed 07/30/22 visit to pcp]  Doctor Visits Discussed/Reviewed Doctor Visits Discussed, PCP, Specialist  [confirmed she will call specialist about her CPAP]  Durable Medical Equipment (DME) Other  [CPAP]  PCP/Specialist Visits Compliance with follow-up visit  [encouraged compliance]  Education Interventions   Education Provided Provided Web-based Education, Provided Education  [action plasns for asthma, chf, info on pyelonephritis, fluid content in foods, containers]  Provided Verbal Education On Sick Day Rules, Probation officer plans sent via [employer] for major medical conditions Asthma, CHF]  Mental Health Interventions   Mental Health Discussed/Reviewed Mental Health Discussed, Other  [coping since discharge]  Pharmacy Interventions   Pharmacy Dicussed/Reviewed Pharmacy Topics Discussed, Affording Medications  Safety Interventions   Safety Discussed/Reviewed Safety Discussed, Home Safety              Our next appointment is by telephone on 08/26/22 at 1115  Please call the care guide team at [phone] if you need to cancel or reschedule your appointment.   If you are experiencing a  Mental Health or Behavioral Health Crisis or need someone to talk to, please call the Suicide and Crisis Lifeline: 988 call the [place] National Suicide Prevention Lifeline: 740 216 6367 or TTY: 432-490-1131 TTY 628 439 1329) to talk to a trained counselor call 1-800-273-TALK (toll free, 24 hour hotline) go to [facility] [address], [place] [phone]) call the [facility] Crisis Line: [phone] call 911   Patient verbalizes understanding of instructions and care plan provided today and agrees to view in MyChart. Active MyChart status and patient understanding of how to access instructions and care plan via MyChart confirmed with patient.     The patient has been provided with contact information for the care management team and has been advised to call with any health related questions or concerns.   Skylan Gift L. Noelle Penner, RN, BSN, CCM [facility] Care Management Community Coordinator Office number [phone]

## 2022-07-28 ENCOUNTER — Telehealth: Payer: Self-pay | Admitting: Pharmacist

## 2022-07-28 DIAGNOSIS — G4733 Obstructive sleep apnea (adult) (pediatric): Secondary | ICD-10-CM | POA: Diagnosis not present

## 2022-07-28 DIAGNOSIS — M869 Osteomyelitis, unspecified: Secondary | ICD-10-CM | POA: Diagnosis not present

## 2022-07-28 DIAGNOSIS — R531 Weakness: Secondary | ICD-10-CM | POA: Diagnosis not present

## 2022-07-28 DIAGNOSIS — M545 Low back pain, unspecified: Secondary | ICD-10-CM | POA: Diagnosis not present

## 2022-07-28 DIAGNOSIS — G894 Chronic pain syndrome: Secondary | ICD-10-CM | POA: Diagnosis not present

## 2022-07-28 NOTE — Progress Notes (Signed)
Care Management & Coordination Services Pharmacy Team  Reason for Encounter: Appointment Reminder  Contacted patient to confirm telephone appointment with Erskine Emery, PharmD on at 07/29/2022. Spoke with patient on 07/28/2022    Star Rating Drugs:  Losartan 50 mg last filled 06/28/2022 30 DS  Care Gaps: Annual wellness visit in last year? Yes  If Diabetic: Last eye exam / retinopathy screening: Last diabetic foot exam:   Future Appointments  Date Time Provider Department Center  07/29/2022 12:30 PM Erroll Luna, [facility] None  07/30/2022 12:00 PM Donita Brooks, MD [facility]  08/09/2022  2:15 PM Felecia Shelling, DPM [facility]  08/26/2022 11:15 AM Clinton Gallant, RN [facility] None   April D Calhoun, [facility] Clinical Pharmacist Assistant [phone]

## 2022-07-29 ENCOUNTER — Encounter: Payer: 59 | Admitting: Pharmacist

## 2022-07-29 DIAGNOSIS — K5903 Drug induced constipation: Secondary | ICD-10-CM | POA: Diagnosis not present

## 2022-07-29 DIAGNOSIS — M25519 Pain in unspecified shoulder: Secondary | ICD-10-CM | POA: Diagnosis not present

## 2022-07-29 DIAGNOSIS — Z79891 Long term (current) use of opiate analgesic: Secondary | ICD-10-CM | POA: Diagnosis not present

## 2022-07-29 NOTE — Progress Notes (Unsigned)
Care Management & Coordination Services Pharmacy Note  07/29/2022 Name:  [name] [name] MRN:  [id number] DOB:  12/22/1965  Summary: ***  Recommendations/Changes made from today's visit: ***  Follow up plan: ***   Subjective: [name] [name] is an 57 y.o. year old female who is a primary patient of Pickard, Priscille Heidelberg, MD.  The care coordination team was consulted for assistance with disease management and care coordination needs.    Engaged with patient by telephone for follow up visit.  Chart review: Recent office visits:  06/18/2022 OV (PCP) Donita Brooks, MD; I will start the patient on Lasix 40 mg a day.    Recent consult visits:  None   Hospital visits:  07/18/22 - Admission - UTI symptoms and upper respiratory symptoms.  Lasix was recently restarted during discharge.  Negative for AKI.  UTI was treated with IV ABX. 05/21/22 - Admisstion - severe sepsis.       Objective:  Lab Results  Component Value Date   CREATININE 1.27 (H) 07/22/2022   BUN 43 (H) 07/22/2022   EGFR 53 (L) 07/02/2022   GFRNONAA 49 (L) 07/22/2022   GFRAA 119 06/27/2020   NA 139 07/22/2022   K 4.0 07/22/2022   CALCIUM 8.8 (L) 07/22/2022   CO2 22 07/22/2022   GLUCOSE 101 (H) 07/22/2022    Lab Results  Component Value Date/Time   HGBA1C 5.4 07/18/2022 05:33 PM   HGBA1C 5.3 04/08/2022 11:17 AM    Last diabetic Eye exam:  Lab Results  Component Value Date/Time   HMDIABEYEEXA No Retinopathy 12/10/2021 07:45 AM    Last diabetic Foot exam: No results found for: "HMDIABFOOTEX"   Lab Results  Component Value Date   CHOL 187 09/18/2021   HDL 67 09/18/2021   LDLCALC 84 09/18/2021   TRIG 271 (H) 09/18/2021   CHOLHDL 2.8 09/18/2021       Latest Ref Rng & Units 07/21/2022    4:30 AM 07/19/2022    5:22 AM 07/18/2022    5:33 PM  Hepatic Function  Total Protein 6.5 - 8.1 g/dL 6.5  6.8  7.2   Albumin 3.5 - 5.0 g/dL 2.9  3.2  3.3   AST 15 - 41 U/L 25  16  18    ALT 0 - 44 U/L 22  12  12     Alk Phosphatase 38 - 126 U/L 62  81  87   Total Bilirubin 0.3 - 1.2 mg/dL 0.3  0.8  0.8     Lab Results  Component Value Date/Time   TSH 5.489 (H) 07/06/2022 03:13 PM   TSH 2.259 06/07/2022 05:19 PM   TSH 2.41 02/01/2022 12:49 PM   TSH 2.567 08/18/2012 11:36 AM   FREET4 0.74 07/07/2022 03:20 AM       Latest Ref Rng & Units 07/22/2022    7:21 AM 07/21/2022    4:30 AM 07/20/2022    4:22 AM  CBC  WBC 4.0 - 10.5 K/uL 8.7  10.1  15.5   Hemoglobin 12.0 - 15.0 g/dL 7.4  7.6  8.6   Hematocrit 36.0 - 46.0 % 23.3  23.3  26.6   Platelets 150 - 400 K/uL 232  226  226     Lab Results  Component Value Date/Time   VITAMINB12 791 07/06/2022 03:13 PM   VITAMINB12 575 06/25/2022 09:31 AM    Clinical ASCVD: {YES/NO:21197} The 10-year ASCVD risk score (Arnett DK, et al., 2019) is: 5.6%   Values used to calculate the score:  Age: 89 years     Sex: Female     Is Non-Hispanic African American: No     Diabetic: Yes     Tobacco smoker: No     Systolic Blood Pressure: 128 mmHg     Is BP treated: Yes     HDL Cholesterol: 67 mg/dL     Total Cholesterol: 187 mg/dL    ***Other: ([id number] if Afib, MMRC or CAT for COPD, ACT, DEXA)     07/27/2022   12:12 PM 06/21/2022   11:14 AM 09/18/2021   10:12 AM  Depression screen PHQ 2/9  Decreased Interest 0 0 0  Down, Depressed, Hopeless 0 0 0  PHQ - 2 Score 0 0 0     Social History   Tobacco Use  Smoking Status Never  Smokeless Tobacco Never   BP Readings from Last 3 Encounters:  07/23/22 128/65  07/12/22 121/76  07/02/22 122/68   Pulse Readings from Last 3 Encounters:  07/23/22 61  07/12/22 71  07/02/22 84   Wt Readings from Last 3 Encounters:  07/23/22 222 lb 10.6 oz (101 kg)  07/11/22 227 lb 1.2 oz (103 kg)  07/02/22 238 lb 12.8 oz (108.3 kg)   BMI Readings from Last 3 Encounters:  07/23/22 38.22 kg/m  07/11/22 38.98 kg/m  07/02/22 40.99 kg/m    Allergies  Allergen Reactions   Tetracyclines & Related Other (See Comments)     Syncope and put her "in a coma"   Tramadol Other (See Comments)    Seizures   Oxybutynin Other (See Comments)    Unknown reaction per Mom   Phenazopyridine Other (See Comments)    Unknown reaction   Ciprofloxacin Rash and Itching   Codeine Itching and Rash   Estradiol Rash    Patches broke out the skin    Medications Reviewed Today     Reviewed by Gwenith Daily, RN (Registered Nurse) on 07/26/22 at 1559  Med List Status: <None>   Medication Order Taking? Sig Documenting Provider Last Dose Status Informant  albuterol (PROAIR HFA) 108 (90 Base) MCG/ACT inhaler [id number] No Inhale 2 puffs = into the lungs every 6 (six) hours as needed for wheezing or shortness of breath.  Patient taking differently: Inhale 2 puffs into the lungs every 6 (six) hours as needed for wheezing or shortness of breath.   Valentino Nose, NP prn prn Active Mother  benzonatate (TESSALON) 200 MG capsule [id number]  Take 1 capsule (200 mg total) by mouth 3 (three) times daily as needed for cough. Kathlen Mody, MD  Active   busPIRone (BUSPAR) 15 MG tablet [id number] No Take 15 mg by mouth 3 (three) times daily.  [provider] 07/06/2022 Active Mother  Calcium Carbonate-Vit D-Min (CALCIUM 1200 PO) [id number] No Take 1,200 mg by mouth daily.  [provider] 07/06/2022 Active Mother  cefadroxil (DURICEF) 500 MG capsule [id number]  Take 2 capsules (1,000 mg total) by mouth 2 (two) times daily for 10 doses. Tresa Moore, MD  Active   DULoxetine (CYMBALTA) 20 MG capsule [id number] No Take 1 capsule (20 mg total) by mouth daily. Tresa Moore, MD 07/06/2022 Expired 07/18/22 2359 Mother  Erenumab-aooe (AIMOVIG) 70 MG/ML SOAJ [id number] No administer 66mL UNDER THE SKIN every 30 DAYS  Patient taking differently: Inject 70 mg into the skin every 30 (thirty) days.   Donita Brooks, MD Past Week Active Mother  escitalopram (LEXAPRO) 10 MG tablet [id number] No Take 10 mg by mouth daily.  [provider] 07/06/2022 Active Mother  estradiol (ESTRACE) 1 MG tablet [id number]  TAKE ONE TABLET BY MOUTH EVERYDAY AT BEDTIME Donita BrooksPickard, Warren T, MD  Active   famotidine (PEPCID) 40 MG tablet [id number] No TAKE ONE TABLET BY MOUTH EVERYDAY AT BEDTIME  Patient taking differently: Take 40 mg by mouth at bedtime.   Donita BrooksPickard, Warren T, MD 07/05/2022 pm Active Mother  feeding supplement (ENSURE ENLIVE / ENSURE PLUS) LIQD [id number]  Take 237 mLs by mouth 2 (two) times daily between meals. Kathlen ModyAkula, Vijaya, MD  Active   furosemide (LASIX) 40 MG tablet [id number] No Take 1 tablet (40 mg total) by mouth daily. Donita BrooksPickard, Warren T, MD 07/06/2022 am Active Mother  guaiFENesin-dextromethorphan [facility](ROBITUSSIN DM) 100-10 MG/5ML syrup [id number]  Take 5-10 mLs by mouth every 4 (four) hours as needed for cough. Kathlen ModyAkula, Vijaya, MD  Active   haloperidol (HALDOL) 2 MG tablet [id number]  Take 1 tablet (2 mg total) by mouth at bedtime as needed for agitation (pyschosis). Kathlen ModyAkula, Vijaya, MD  Active   Iron, Ferrous Sulfate, 325 (65 Fe) MG TABS [id number] No Take 325 mg by mouth in the morning and at bedtime. Donita BrooksPickard, Warren T, MD 07/06/2022 am Active Mother  lamoTRIgine (LAMICTAL) 100 MG tablet [id number] No Take 100 mg by mouth daily. [provider] Taking Active Mother  levocetirizine (XYZAL) 5 MG tablet [id number] No Take 5 mg by mouth every evening. [provider] 07/05/2022 pm Active Mother  losartan (COZAAR) 50 MG tablet [id number] No Take 1 tablet (50 mg total) by mouth daily. Joycelyn DasPokhrel, Laxman, MD 07/06/2022 am Active Mother  methocarbamol (ROBAXIN) 500 MG tablet [id number]  Take 1 tablet (500 mg total) by mouth 3 (three) times daily for 20 days. Tresa MooreSreenath, Sudheer B, MD  Active   Multiple Vitamins-Minerals (MULTIVITAMIN WITH MINERALS) tablet [id number] No Take 1 tablet by mouth daily. [provider] 07/06/2022 am Active Mother  naloxone [facility](NARCAN) nasal spray 4 mg/0.1 mL [id number] No Place 0.4 mg into the  nose once. [provider] prn prn Active Mother  oxyCODONE-acetaminophen (PERCOCET) 10-325 MG tablet [id number]  Take one tablet by mouth up to 4 times daily as needed. [provider]  Active Pharmacy Records           Med Note Michaelle Copas([facility]   Mon Jul 19, 2022  3:08 AM) Per discharge note, patient on oxycodone-APAP 5-325. Had just gotten a 30 day supply of oxy-APAP 10-325 on 06/28/22.  pantoprazole (PROTONIX) 40 MG tablet [id number] No Take 1 tablet (40 mg total) by mouth 2 (two) times daily. Donita BrooksPickard, Warren T, MD 07/06/2022 Active Mother  polyethylene glycol (MIRALAX / GLYCOLAX) 17 g packet [id number]  Take 17 g by mouth daily. Kathlen ModyAkula, Vijaya, MD  Active   pregabalin (LYRICA) 100 MG capsule [id number] No Take 1 capsule (100 mg total) by mouth 2 (two) times daily. Donita BrooksPickard, Warren T, MD 07/06/2022 am Active   RELISTOR 150 MG TABS [id number] No Take 450 mg by mouth every morning. [provider] 07/06/2022 am Active Mother  solifenacin (VESICARE) 10 MG tablet [id number] No TAKE ONE TABLET BY MOUTH DAILY Donita BrooksPickard, Warren T, MD 07/06/2022 am Active Mother  spironolactone (ALDACTONE) 25 MG tablet [id number] No Take 1 tablet (25 mg total) by mouth daily. Joycelyn DasPokhrel, Laxman, MD 07/06/2022 am Active Mother  SUMAtriptan (IMITREX) 50 MG tablet [id number]  Take 50 mg by mouth every 2 (two) hours as needed for migraine. [provider]  Active   topiramate (TOPAMAX) 50 MG tablet [id number] No TAKE  ONE TABLET BY MOUTH TWICE DAILY Donita Brooks, MD 07/06/2022 am Active Mother  traZODone (DESYREL) 100 MG tablet [id number]  Take 1 tablet (100 mg total) by mouth at bedtime. Kathlen Mody, MD  Active             SDOH:  (Social Determinants of Health) assessments and interventions performed: {yes/no:20286} SDOH Interventions    Flowsheet Row Care Coordination from 07/27/2022 in [facility] Community Care Coordination Telephone from 07/26/2022 in [facility]  Community Care Coordination Telephone from 07/13/2022 in [facility] Community Care Coordination ED to Hosp-Admission (Discharged) from 06/24/2022 in [facility] HF PCU Telephone from 06/16/2022 in [facility] Community Care Coordination Admission (Discharged) from 04/14/2022 in [facility]  SDOH Interventions        Food Insecurity Interventions Intervention Not Indicated -- -- -- -- Intervention Not Indicated  Housing Interventions Intervention Not Indicated -- Intervention Not Indicated Intervention Not Indicated Intervention Not Indicated Intervention Not Indicated  Transportation Interventions Intervention Not Indicated Intervention Not Indicated Intervention Not Indicated -- Intervention Not Indicated Intervention Not Indicated  Utilities Interventions Intervention Not Indicated -- -- -- -- Intervention Not Indicated  Financial Strain Interventions Intervention Not Indicated -- Intervention Not Indicated -- -- --  Physical Activity Interventions -- Other (Comments)  [Home health PT and OT ordered at hospital discharge on 07/23/22] -- -- -- --  Stress Interventions Intervention Not Indicated -- -- -- -- --       Medication Assistance: {MEDASSISTANCEINFO:25044}  Medication Access: Within the past 30 days, how often has patient missed a dose of medication? *** Is a pillbox or other method used to improve adherence? {YES/NO:21197} Factors that may affect medication adherence? {CHL DESC; BARRIERS:21522} Are meds synced by current pharmacy? {YES/NO:21197} Are meds delivered by current pharmacy? {YES/NO:21197} Does patient experience delays in picking up medications due to transportation concerns? {YES/NO:21197}  [facility] Reviewed: Is patient disadvantaged to use [facility]?: {YES/NO:21197} Current Rx insurance plan: *** Name and location of Current pharmacy:  [facility]- [place], [place] - [address] [place] [address] Phone: [phone] Fax: [phone]  [facility] - [place], [place] - [address] [place] [address] Phone: [phone] Fax: [phone]  [id number] [place], [place] - [address] [place] [address] Phone: [phone] Fax: [phone]  [facility] [address], [place] - [address] [place] [address] Phone: [phone] Fax: [phone]  [facility] services reviewed with patient today?: {YES/NO:21197} Patient requests to transfer care to [facility]?: {YES/NO:21197} Reason patient declined to change pharmacies: {US patient preference:27474}  Compliance/Adherence/Medication fill history: Losartan 50 mg last filled 06/28/2022 30 DS   Care Gaps: Annual wellness visit in last year? Yes   Assessment/Plan     Hypertension (BP goal <140/90) -Controlled -Current treatment: Metoprolol XL 25mg  daily Appropriate, Effective, Safe, Accessible -Medications previously tried: HCTZ  -Current home readings: 120s/75 -Current exercise habits: minimal -Denies hypotensive/hypertensive symptoms -Educated on BP goals and benefits of medications for prevention of heart attack, stroke and kidney damage; Importance of home blood pressure monitoring; Symptoms of hypotension and importance of maintaining adequate hydration; -Counseled to monitor BP at home a few times per week, document, and provide log at future appointments -Recommended to continue current medication BP controlled at home per her reports.  Asthma (Goal: control symptoms and prevent exacerbations) 12/10/21 -Controlled -Current treatment  Albuterol HFA 90 mcg prn Appropriate, Effective, Safe, Accessible -Medications previously tried: none noted  -Pulmonary function testing: Pulmonary Functions  Testing Results: No results found for: FEV1, FVC, FEV1FVC, TLC, DLCO -Exacerbations requiring treatment in last 6 months: none -Patient denies consistent use of maintenance inhaler -Frequency of rescue inhaler use: as needed -Counseled on When to use rescue inhaler -She reports using her rescue inhaler about once weekly currently. Denies any episodes of SOB. No changes needed continue her current medication at this time.  Depression/Anxiety/Bipolar (Goal: Reduce/minimize symptoms) 12/10/21 -Controlled -Current treatment: Buspirone 15mg  TID Appropriate, Effective, Safe, Accessible Duloxetine 60mg  daily  Appropriate, Effective, Safe, Accessible Lexapro 10mg  daily  Appropriate, Effective, Safe, Accessible Haloperidol 5mg  hs  Appropriate, Effective, Safe, Accessible Prazosin 2mg  hs  Appropriate, Effective, Safe, Accessible Lamictal 100mg  once daily Appropriate, Effective, Safe, Effective -Medications previously tried/failed: none noted -PHQ9:     09/18/2021   10:12 AM 08/21/2020    2:15 PM 02/08/2020   11:38 AM  PHQ9 SCORE ONLY  PHQ-9 Total Score 0 0 0   Managed by psych, recently added Lamictal and it has been titrated up to 100mg . She is still having anger outbursts that she states she cannot control. Declines counseling at this time, discussed lifestyle mods such as exercise.  Denies any suicidal ideation or harmful thoughts. For now continue, recommend FU with psych if no improvement with increased lamictal.  Migraines (Goal: Reduce migraines) -Controlled -Current treatment  Aimovig 70mg  once monthly  Appropriate, Effective, Safe, Accessible Topamax 50mg  BID  Appropriate, Effective, Safe, Accessible -Medications previously tried: none noted -Denies any migraines in about 1 year  -Recommended to continue current medication Meds are all affordable as she has medicare/medicaid.          Willa Frater, PharmD, CPP Clinical Pharmacist Practitioner [facility] [phone]

## 2022-07-30 ENCOUNTER — Inpatient Hospital Stay: Payer: 59 | Admitting: Family Medicine

## 2022-08-03 ENCOUNTER — Telehealth: Payer: Self-pay

## 2022-08-03 NOTE — Telephone Encounter (Signed)
Eileen Stanford RN with [facility] called in stating that pt was re-hospitalized on 07/07/22 and since d/c [facility] has tried several times to r/s with pt to resume services. Pt asked if they would call today to set up a time/day to resume services but is not answering calls. [facility] states they have no choice but to discharge pt and wanted to make pcp aware of this change. If pt would like to resume services, pt would have to seek another [facility] agency.  If nurse/pcp has any questions please feel free to contact Mack with [facility] at [phone]

## 2022-08-06 ENCOUNTER — Inpatient Hospital Stay: Payer: 59 | Admitting: Family Medicine

## 2022-08-09 ENCOUNTER — Encounter: Payer: 59 | Admitting: Podiatry

## 2022-08-11 ENCOUNTER — Other Ambulatory Visit: Payer: Self-pay | Admitting: Family Medicine

## 2022-08-13 ENCOUNTER — Inpatient Hospital Stay: Payer: 59 | Admitting: Family Medicine

## 2022-08-14 DIAGNOSIS — M25552 Pain in left hip: Secondary | ICD-10-CM | POA: Diagnosis not present

## 2022-08-14 DIAGNOSIS — M25561 Pain in right knee: Secondary | ICD-10-CM | POA: Diagnosis not present

## 2022-08-14 DIAGNOSIS — M25511 Pain in right shoulder: Secondary | ICD-10-CM | POA: Diagnosis not present

## 2022-08-14 DIAGNOSIS — M961 Postlaminectomy syndrome, not elsewhere classified: Secondary | ICD-10-CM | POA: Diagnosis not present

## 2022-08-16 ENCOUNTER — Encounter: Payer: 59 | Admitting: Podiatry

## 2022-08-25 ENCOUNTER — Telehealth: Payer: Self-pay | Admitting: Pharmacist

## 2022-08-25 NOTE — Progress Notes (Signed)
Care Management & Coordination Services Pharmacy Team  Reason for Encounter: Medication coordination and delivery  Contacted patient to discuss medications and coordinate delivery from [facility]. Unsuccessful outreach. Left voicemail for patient to return call. Cycle dispensing form sent to [facility] for review.   Last adherence delivery date: 06/30/2022     Patient is due for next adherence delivery on: 09/06/2022  This delivery to include: Vials  30 Days  Aimovig 70 mg/mL once a month Solifenacin 10 mg once daily Estradiol 1 mg once daily Cyclobenzaprine 10 mg twice daily as needed Famotidine 40 mg at bedtime Pantoprazole 40 mg twice daily Duloxetine 20 mg once daily - patient reports decrease Pregabalin 100 mg Levocetirizine 5 mg once daily Lamotrigine 100mg  daily Topiramate 50 mg twice daily Prazosin 2 mg at bedtime Haloperidol 5 mg at bedtime Escitalopram 10 mg once daily Metoprolol 25 mg three tablets daily Buspirone 15 mg three times daily Trazodone 100 mg two tablets at bedtime as neede  No refill request needed.  Delivery scheduled for 09/06/2022. Unable to speak with patient to confirm date.    Chart review: Recent office visits:  None  Recent consult visits:  None  Hospital visits:  07/18/2022 ED to Hospital Admission due to Acute pyelonephritis, Sepsis Admit date: 07/18/2022 Discharge date: 07/23/2022 -No medication changes noted.  07/06/2022 ED to Hospital Admission due to Acute encephalopathy  Medications: Outpatient Encounter Medications as of 08/25/2022  Medication Sig Note   albuterol (PROAIR HFA) 108 (90 Base) MCG/ACT inhaler Inhale 2 puffs = into the lungs every 6 (six) hours as needed for wheezing or shortness of breath. (Patient taking differently: Inhale 2 puffs into the lungs every 6 (six) hours as needed for wheezing or shortness of breath.)    benzonatate (TESSALON) 200 MG capsule Take 1 capsule (200 mg total) by mouth 3  (three) times daily as needed for cough.    busPIRone (BUSPAR) 15 MG tablet Take 15 mg by mouth 3 (three) times daily.     Calcium Carbonate-Vit D-Min (CALCIUM 1200 PO) Take 1,200 mg by mouth daily.     DULoxetine (CYMBALTA) 20 MG capsule Take 1 capsule (20 mg total) by mouth daily.    DULoxetine (CYMBALTA) 60 MG capsule TAKE ONE CAPSULE BY MOUTH ONCE DAILY    Erenumab-aooe (AIMOVIG) 70 MG/ML SOAJ administer 1mL UNDER THE SKIN every 30 DAYS (Patient taking differently: Inject 70 mg into the skin every 30 (thirty) days.)    escitalopram (LEXAPRO) 10 MG tablet Take 10 mg by mouth daily.    estradiol (ESTRACE) 1 MG tablet TAKE ONE TABLET BY MOUTH EVERYDAY AT BEDTIME    famotidine (PEPCID) 40 MG tablet TAKE ONE TABLET BY MOUTH EVERYDAY AT BEDTIME    feeding supplement (ENSURE ENLIVE / ENSURE PLUS) LIQD Take 237 mLs by mouth 2 (two) times daily between meals.    furosemide (LASIX) 40 MG tablet Take 1 tablet (40 mg total) by mouth daily.    guaiFENesin-dextromethorphan (ROBITUSSIN DM) 100-10 MG/5ML syrup Take 5-10 mLs by mouth every 4 (four) hours as needed for cough.    haloperidol (HALDOL) 2 MG tablet Take 1 tablet (2 mg total) by mouth at bedtime as needed for agitation (pyschosis).    Iron, Ferrous Sulfate, 325 (65 Fe) MG TABS Take 325 mg by mouth in the morning and at bedtime.    lamoTRIgine (LAMICTAL) 100 MG tablet Take 100 mg by mouth daily.    levocetirizine (XYZAL) 5 MG tablet TAKE ONE TABLET BY MOUTH EVERY  EVENING    losartan (COZAAR) 50 MG tablet Take 1 tablet (50 mg total) by mouth daily.    Multiple Vitamins-Minerals (MULTIVITAMIN WITH MINERALS) tablet Take 1 tablet by mouth daily.    naloxone (NARCAN) nasal spray 4 mg/0.1 mL Place 0.4 mg into the nose once.    oxyCODONE-acetaminophen (PERCOCET) 10-325 MG tablet Take one tablet by mouth up to 4 times daily as needed. 07/19/2022: Per discharge note, patient on oxycodone-APAP 5-325. Had just gotten a 30 day supply of oxy-APAP 10-325 on 06/28/22.    pantoprazole (PROTONIX) 40 MG tablet Take 1 tablet (40 mg total) by mouth 2 (two) times daily.    polyethylene glycol (MIRALAX / GLYCOLAX) 17 g packet Take 17 g by mouth daily.    pregabalin (LYRICA) 100 MG capsule Take 1 capsule (100 mg total) by mouth 2 (two) times daily.    RELISTOR 150 MG TABS Take 450 mg by mouth every morning.    solifenacin (VESICARE) 10 MG tablet TAKE ONE TABLET BY MOUTH ONCE DAILY    spironolactone (ALDACTONE) 25 MG tablet Take 1 tablet (25 mg total) by mouth daily.    SUMAtriptan (IMITREX) 50 MG tablet Take 50 mg by mouth every 2 (two) hours as needed for migraine.    topiramate (TOPAMAX) 50 MG tablet TAKE ONE TABLET BY MOUTH TWICE DAILY    traZODone (DESYREL) 100 MG tablet Take 1 tablet (100 mg total) by mouth at bedtime.    No facility-administered encounter medications on file as of 08/25/2022.   BP Readings from Last 3 Encounters:  07/23/22 128/65  07/12/22 121/76  07/02/22 122/68    Pulse Readings from Last 3 Encounters:  07/23/22 61  07/12/22 71  07/02/22 84    Lab Results  Component Value Date/Time   HGBA1C 5.4 07/18/2022 05:33 PM   HGBA1C 5.3 04/08/2022 11:17 AM   Lab Results  Component Value Date   CREATININE 1.27 (H) 07/22/2022   BUN 43 (H) 07/22/2022   GFRNONAA 49 (L) 07/22/2022   GFRAA 119 06/27/2020   NA 139 07/22/2022   K 4.0 07/22/2022   CALCIUM 8.8 (L) 07/22/2022   CO2 22 07/22/2022     Future Appointments  Date Time Provider Department Center  08/26/2022 11:15 AM Clinton Gallant, RN [facility] None   April D Calhoun, [facility] Clinical Pharmacist Assistant [phone]

## 2022-08-26 ENCOUNTER — Ambulatory Visit: Payer: Self-pay | Admitting: *Deleted

## 2022-08-26 NOTE — Patient Outreach (Signed)
  Care Coordination   08/26/2022 Name: [name] [name] MRN: [id number] DOB: November 22, 1965   Care Coordination Outreach Attempts:  An unsuccessful telephone outreach was attempted today to offer the patient information about available care coordination services.  Follow Up Plan:  Additional outreach attempts will be made to offer the patient care coordination information and services.   Encounter Outcome:  No Answer   Care Coordination Interventions:  No, not indicated    Andreana Klingerman L. Noelle Penner, RN, BSN, CCM [facility] Care Management Community Coordinator Office number [phone]

## 2022-08-31 ENCOUNTER — Other Ambulatory Visit: Payer: Self-pay | Admitting: Family Medicine

## 2022-08-31 NOTE — Telephone Encounter (Signed)
Requested medication (s) are due for refill today: Yes  Requested medication (s) are on the active medication list: Yes  Last refill:  02/08/22  Future visit scheduled: No  Notes to clinic:  No assigned protocol.    Requested Prescriptions  Pending Prescriptions Disp Refills   AIMOVIG 70 MG/ML SOAJ [Pharmacy Med Name: Aimovig Autoinjector 70 mg/mL subcutaneous auto-injector] 1 mL 5    Sig: administer 1mL UNDER THE SKIN every 30 DAYS     Off-Protocol Failed - 08/31/2022  8:04 AM      Failed - Medication not assigned to a protocol, review manually.      Failed - Valid encounter within last 12 months    Recent Outpatient Visits           11 months ago Benign essential HTN   [facility] Pickard, Priscille Heidelberg, MD   1 year ago Infectious diarrhea   [facility] Tanya Nones, Priscille Heidelberg, MD   1 year ago Blood in stool, frank   [facility] Tanya Nones, Priscille Heidelberg, MD   1 year ago 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)   [facility] Medicine Donita Brooks, MD   2 years ago Upper respiratory tract infection, unspecified type   [facility] Medicine Valentino Nose, NP             Off-Protocol Failed - 08/31/2022  8:04 AM      Failed - Medication not assigned to a protocol, review manually.      Failed - Valid encounter within last 12 months    Recent Outpatient Visits           11 months ago Benign essential HTN   [facility] Pickard, Priscille Heidelberg, MD   1 year ago Infectious diarrhea   [facility] Tanya Nones, Priscille Heidelberg, MD   1 year ago Blood in stool, frank   [facility] Medicine Tanya Nones, Priscille Heidelberg, MD   1 year ago 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)   [facility] Family Medicine Donita Brooks, MD   2 years ago Upper respiratory tract infection, unspecified type   [facility] Medicine Valentino Nose, NP

## 2022-09-03 ENCOUNTER — Other Ambulatory Visit: Payer: Self-pay | Admitting: Family Medicine

## 2022-09-03 NOTE — Telephone Encounter (Signed)
Requested medication (s) are due for refill today:   Yes  Requested medication (s) are on the active medication list:   Yes  Future visit scheduled:   No  [name] [name] sent a MyChart message today (5/17) letting her know she needs an appt.   Last ordered: 02/08/2022 1 ml, 5 refills  Returned because there isn't a protocol assigned to this medication.   This is a duplicate request.   Looks like it was addressed earlier today.   Requested Prescriptions  Pending Prescriptions Disp Refills   AIMOVIG 70 MG/ML SOAJ [Pharmacy Med Name: Aimovig Autoinjector 70 mg/mL subcutaneous auto-injector] 1 mL 5    Sig: administer 1mL UNDER THE SKIN every 30 DAYS     Off-Protocol Failed - 09/03/2022 10:09 AM      Failed - Medication not assigned to a protocol, review manually.      Failed - Valid encounter within last 12 months    Recent Outpatient Visits           11 months ago Benign essential HTN   [facility] Pickard, Priscille Heidelberg, MD   1 year ago Infectious diarrhea   [facility] Tanya Nones, Priscille Heidelberg, MD   1 year ago Blood in stool, frank   [facility] Medicine Tanya Nones, Priscille Heidelberg, MD   1 year ago 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)   [facility] Medicine Donita Brooks, MD   2 years ago Upper respiratory tract infection, unspecified type   [facility] Medicine Valentino Nose, NP             Off-Protocol Failed - 09/03/2022 10:09 AM      Failed - Medication not assigned to a protocol, review manually.      Failed - Valid encounter within last 12 months    Recent Outpatient Visits           11 months ago Benign essential HTN   [facility] Pickard, Priscille Heidelberg, MD   1 year ago Infectious diarrhea   [facility] Tanya Nones, Priscille Heidelberg, MD   1 year ago Blood in stool, frank   [facility] Medicine Tanya Nones, Priscille Heidelberg, MD   1 year ago 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)    [facility] Donita Brooks, MD   2 years ago Upper respiratory tract infection, unspecified type   [facility] Medicine Valentino Nose, NP

## 2022-09-07 IMAGING — CT CT HEAD W/O CM
3 of 4 series · 13 of 47 positions shown, 15 images · non-contrast
Comparison: August 26, 2019

CLINICAL DATA: Headache and pressure behind the eyes.



[Series 3: head without · axial · non-contrast · 0.42mm/px · z∈[+1410,+1545]mm · 7 of 37 slices shown, 9 images]
[im 5/37  brain]
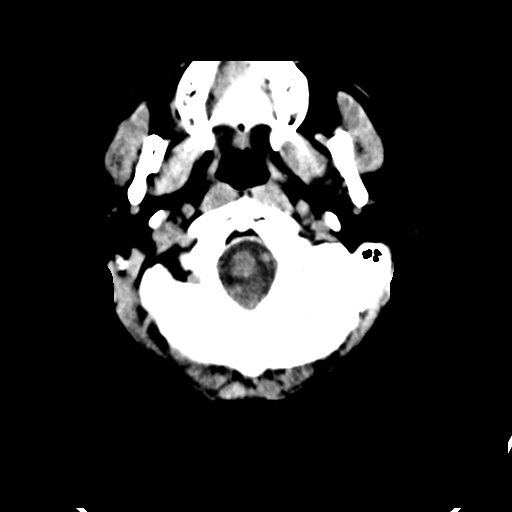
[im 5/37  bone]
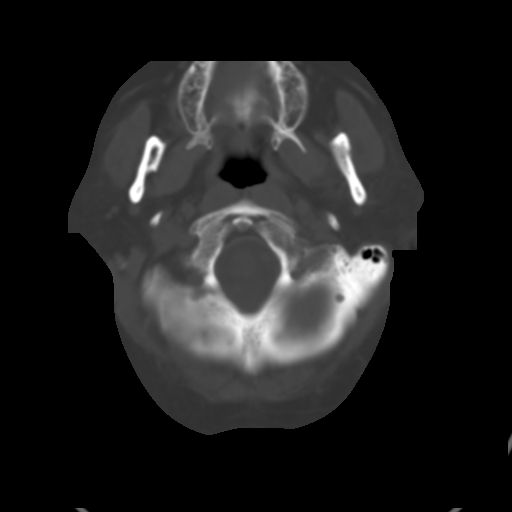
[im 10/37  brain]
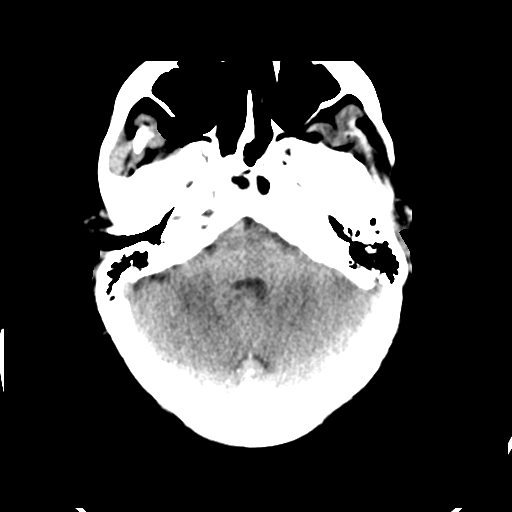
[im 14/37  brain]
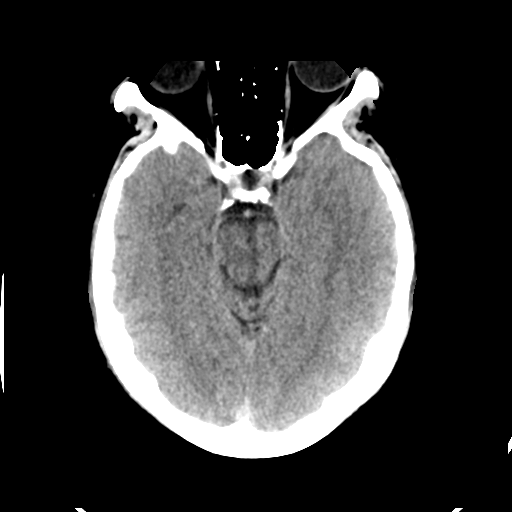
[im 19/37  brain]
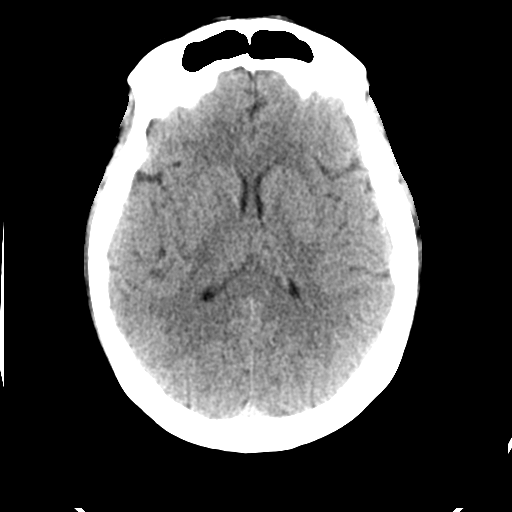
[im 23/37  brain]
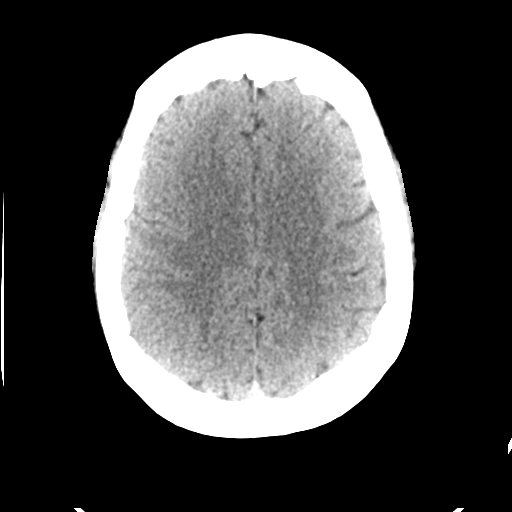
[im 23/37  bone]
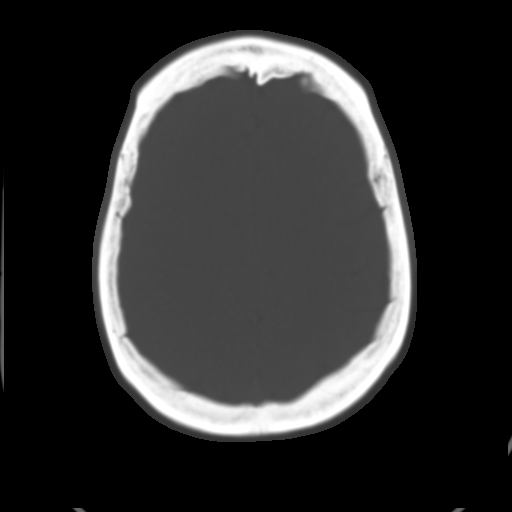
[im 28/37  brain]
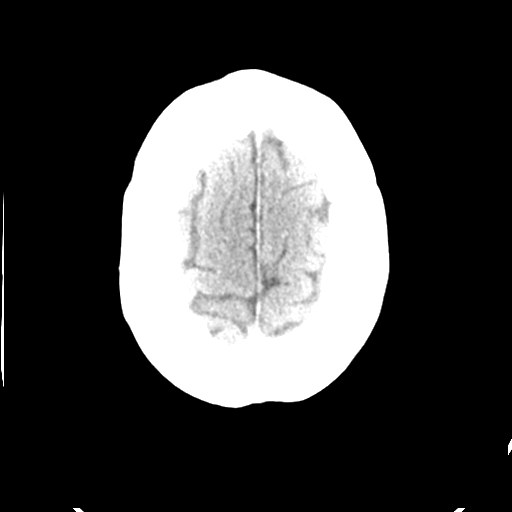
[im 32/37  brain]
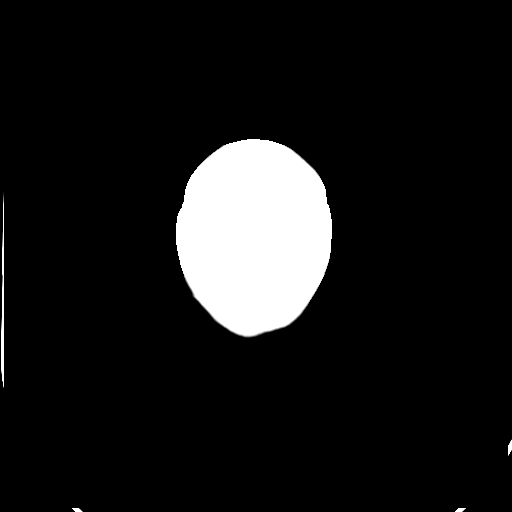

[Series 5: head without cor · coronal · non-contrast · 0.35mm/px · 3 of 67 slices shown]
[im 23/67  brain]
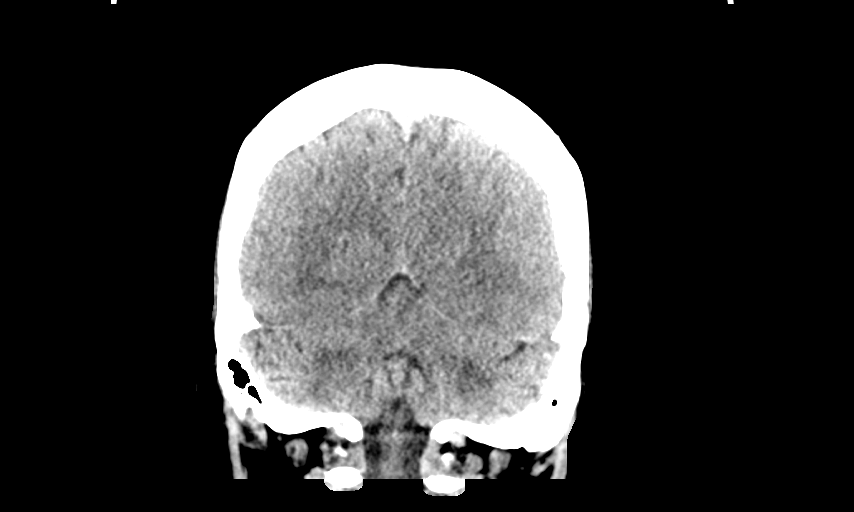
[im 30/67  brain]
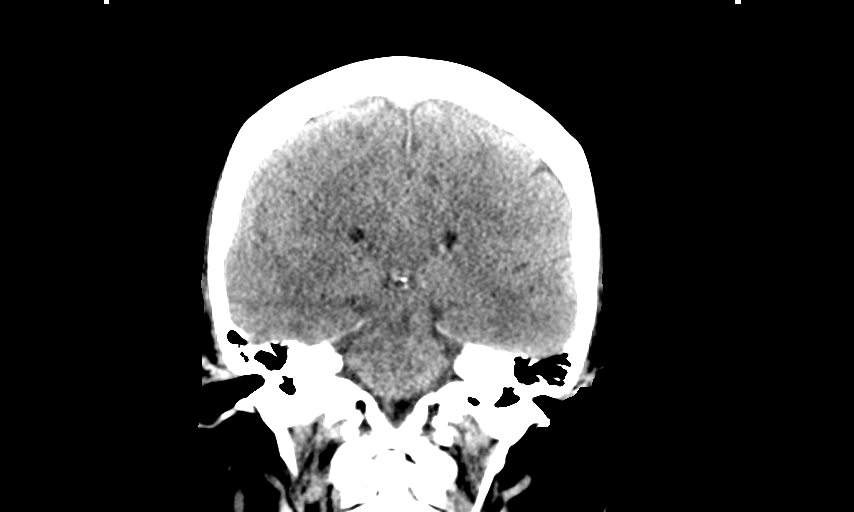
[im 37/67  brain]
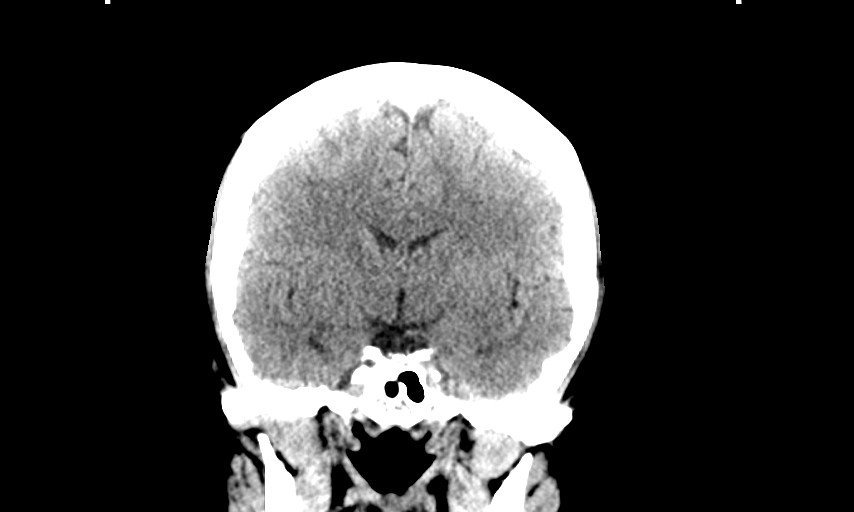

[Series 6: head without sag · sagittal · non-contrast · 0.35mm/px · 3 of 67 slices shown]
[im 23/67  brain]
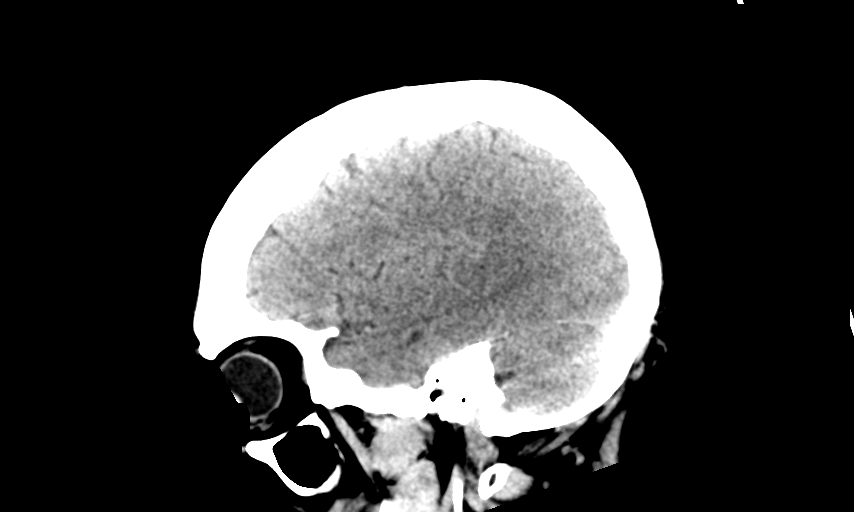
[im 34/67  brain]
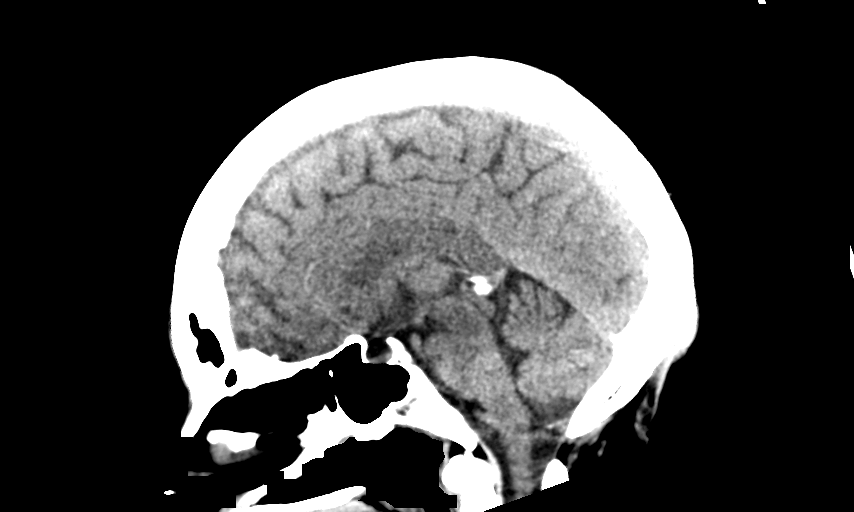
[im 45/67  brain]
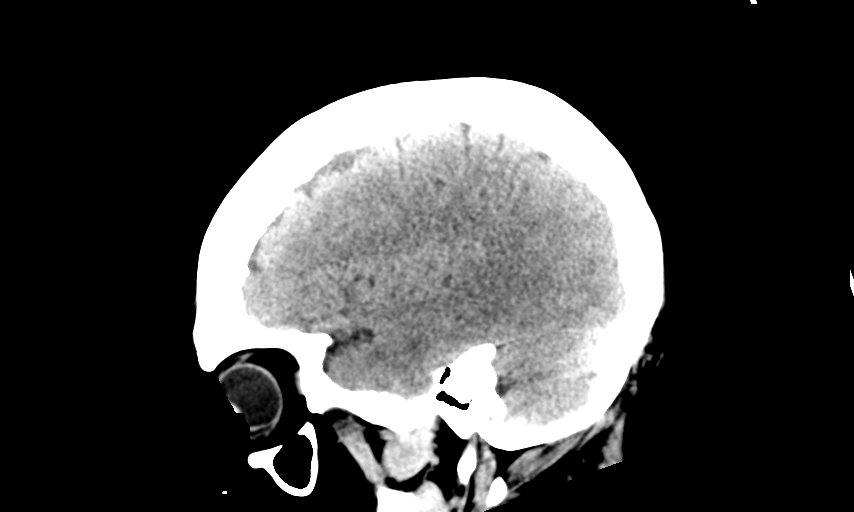

[13 of 47 positions shown; findings below may reference images not displayed]

FINDINGS: Brain: No evidence of acute infarction, hemorrhage, hydrocephalus,
extra-axial collection or mass lesion/mass effect.

Vascular: No hyperdense vessel or unexpected calcification.

Skull: Normal. Negative for fracture or focal lesion.

Sinuses/Orbits: No acute finding.

Other: None.
IMPRESSION: No acute intracranial process.

## 2022-09-07 IMAGING — MR MR HEAD W/O CM
13 of 14 series · 44 of 48 positions shown · non-contrast
Comparison: Head CT from earlier today

CLINICAL DATA: Tongue numbness for 1.5 hours

EXAM:
MRI HEAD WITHOUT CONTRAST
TECHNIQUE: Multiplanar, multiecho pulse sequences of the brain and surrounding
structures were obtained without intravenous contrast.

[Series 5: DWI · axial · 3.0mm · 0.88mm/px · z∈[-72,+83]mm · 6 of 106 slices shown (1 of 4)]
[im 1/106]
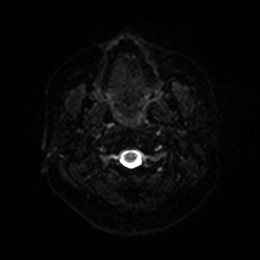
[im 22/106]
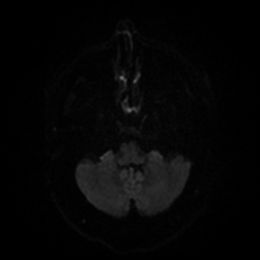
[im 43/106]
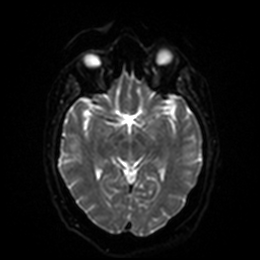
[im 64/106]
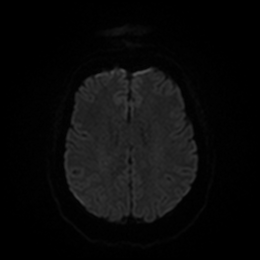
[im 85/106]
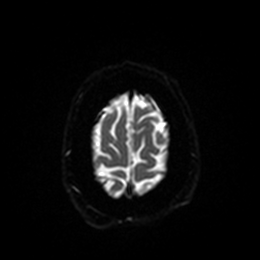
[im 106/106]
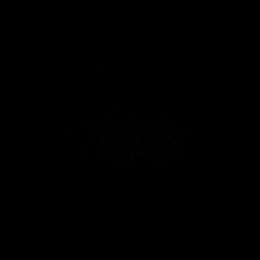

[Series 6: DWI · axial · 3.0mm · 0.88mm/px · z∈[-72,+80]mm · 4 of 52 slices shown (2 of 4)]
[im 1/52]
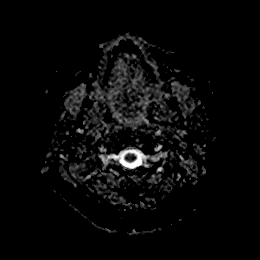
[im 18/52]
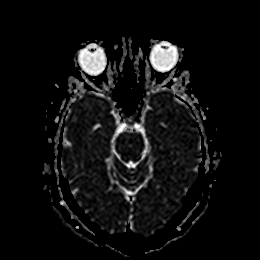
[im 35/52]
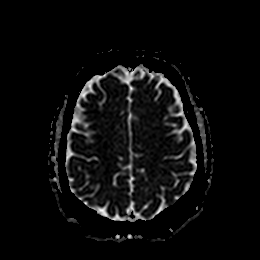
[im 52/52]
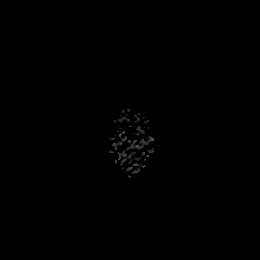

[Series 7: DWI · coronal · 4.0mm · 0.88mm/px · 6 of 81 slices shown (3 of 4)]
[im 1/81]
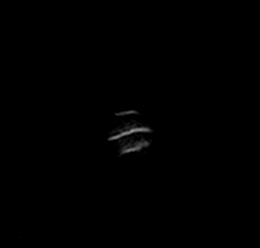
[im 17/81]
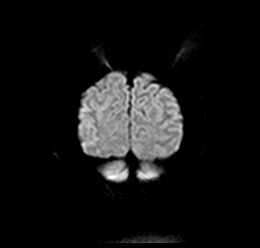
[im 33/81]
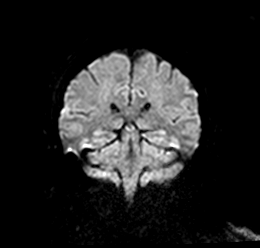
[im 49/81]
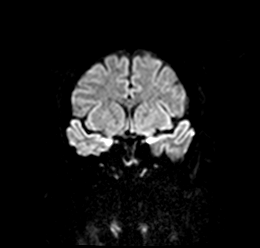
[im 65/81]
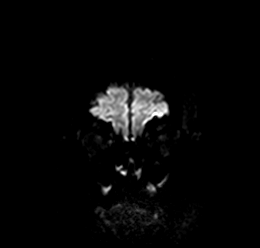
[im 81/81]
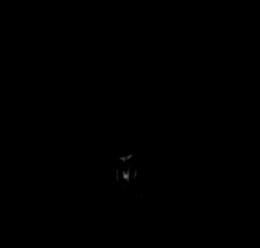

[Series 8: DWI · coronal · 4.0mm · 0.88mm/px · 3 of 41 slices shown (4 of 4)]
[im 1/41]
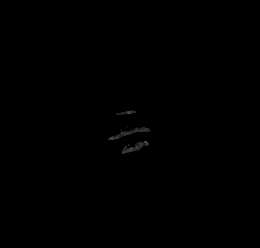
[im 21/41]
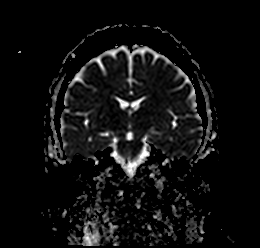
[im 41/41]
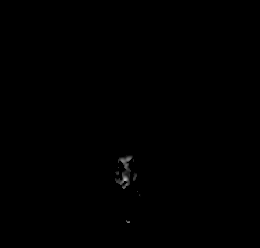

[Series 9: T1 · sagittal · 5.0mm · 0.75mm/px · 2 of 28 slices shown]
[im 1/28]
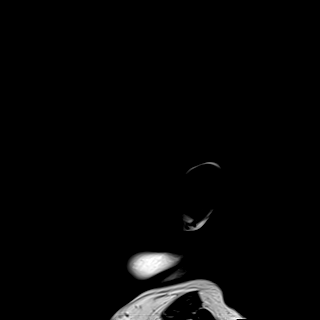
[im 28/28]
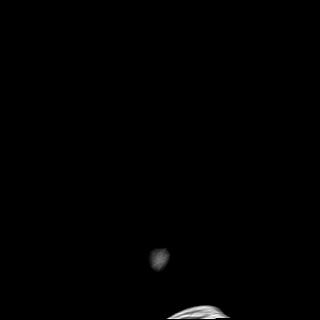

[Series 10: T2 · axial · 5.0mm · 0.72mm/px · z∈[-75,+86]mm · 2 of 28 slices shown (1 of 3)]
[im 1/28]
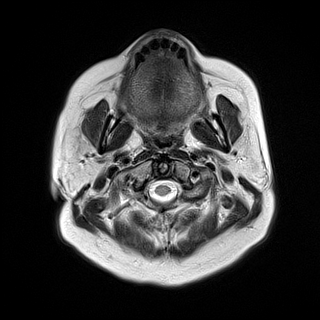
[im 28/28]
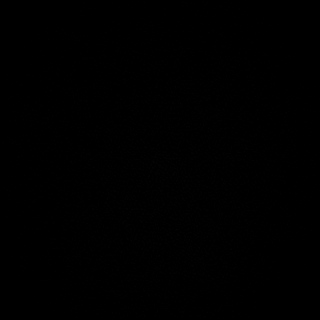

[Series 11: FLAIR · axial · 5.0mm · 0.45mm/px · z∈[-80,+81]mm · 2 of 28 slices shown]
[im 1/28]
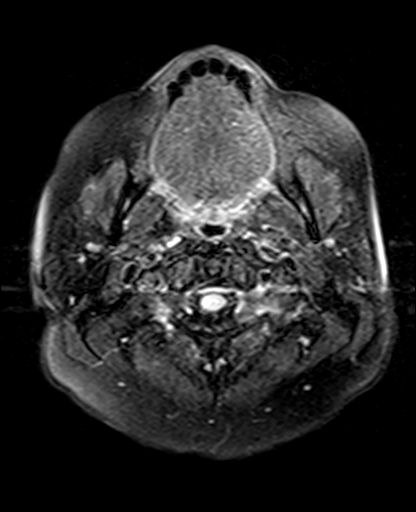
[im 28/28]
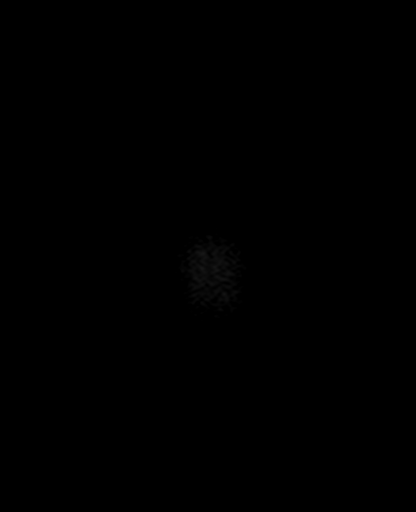

[Series 12: mag_images · axial · 3.0mm · 0.90mm/px · z∈[-83,+81]mm · 4 of 55 slices shown]
[im 1/55]
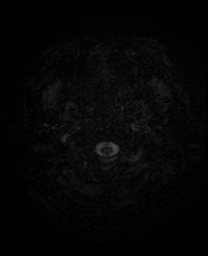
[im 19/55]
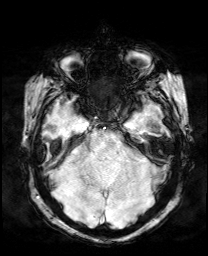
[im 37/55]
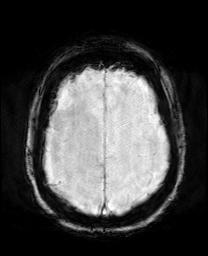
[im 55/55]
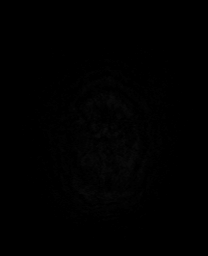

[Series 13: pha_images · axial · 3.0mm · 0.90mm/px · z∈[-83,+81]mm · 4 of 56 slices shown]
[im 1/56]
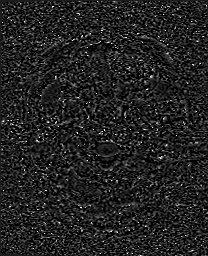
[im 19/56]
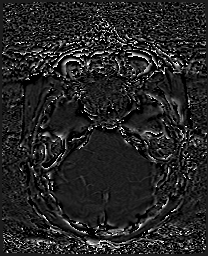
[im 37/56]
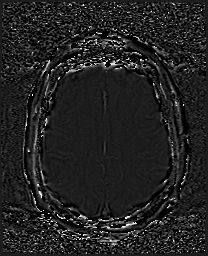
[im 56/56]
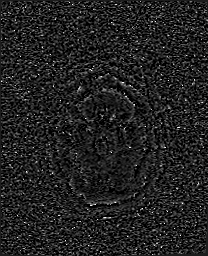

[Series 14: swi_images · axial · 3.0mm · 0.90mm/px · z∈[-83,+78]mm · 4 of 55 slices shown]
[im 1/55]
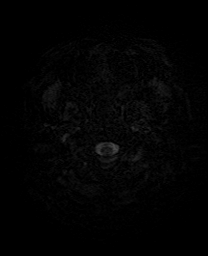
[im 19/55]
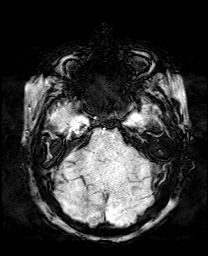
[im 37/55]
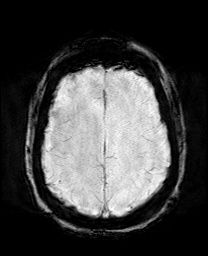
[im 55/55]
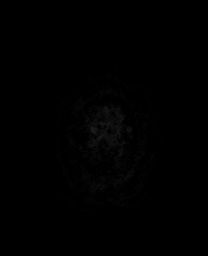

[Series 15: mip_images(sw) · axial · 24.0mm · 0.90mm/px · z∈[-73,+70]mm · 3 of 49 slices shown]
[im 1/49]
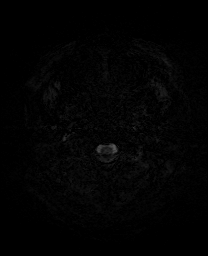
[im 25/49]
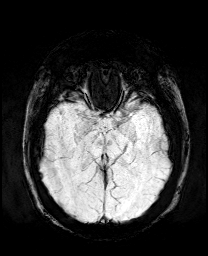
[im 49/49]
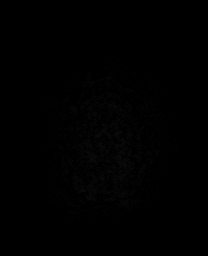

[Series 17: T2 · coronal · 5.0mm · 0.34mm/px · 2 of 33 slices shown (2 of 3)]
[im 1/33]
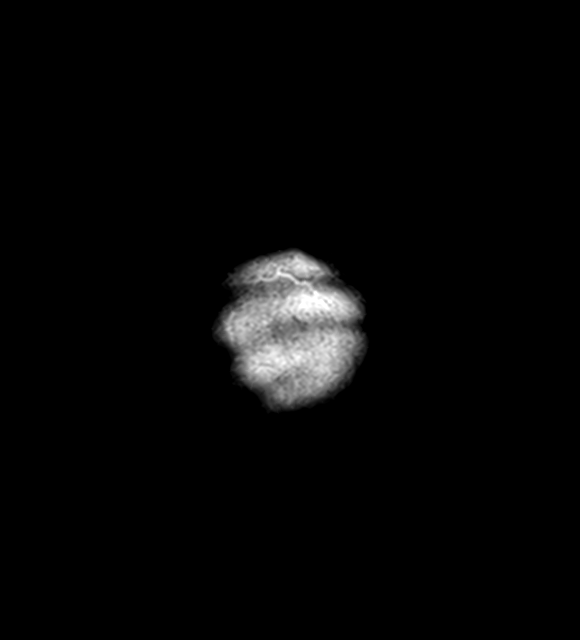
[im 33/33]
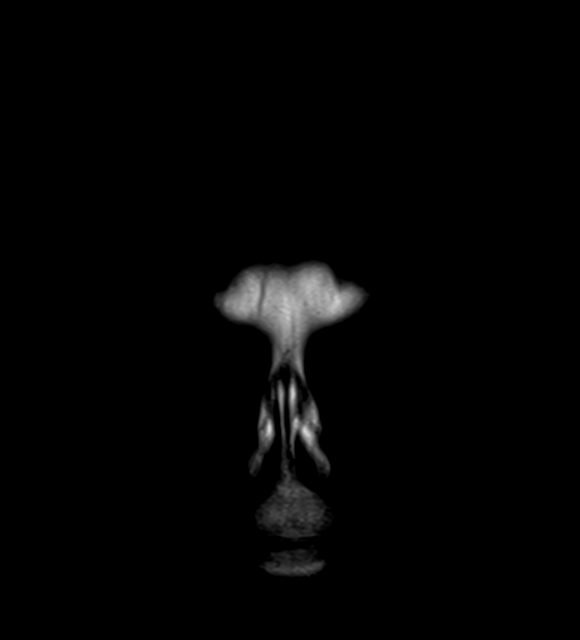

[Series 18: T2 · coronal · 5.0mm · 0.34mm/px · 2 of 33 slices shown (3 of 3)]
[im 1/33]
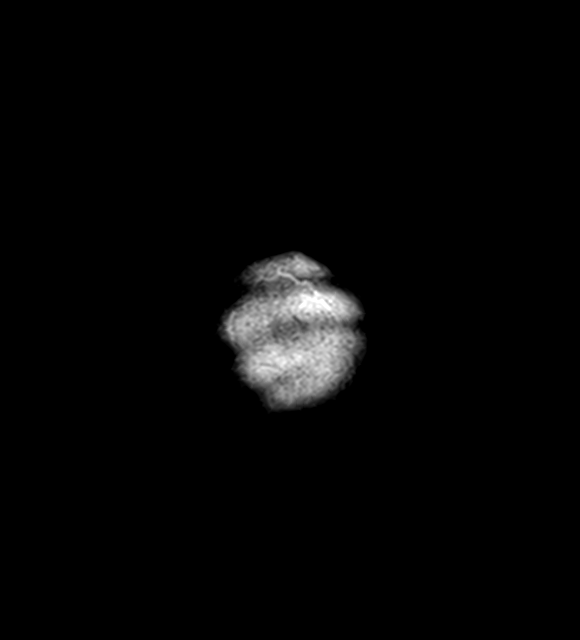
[im 33/33]
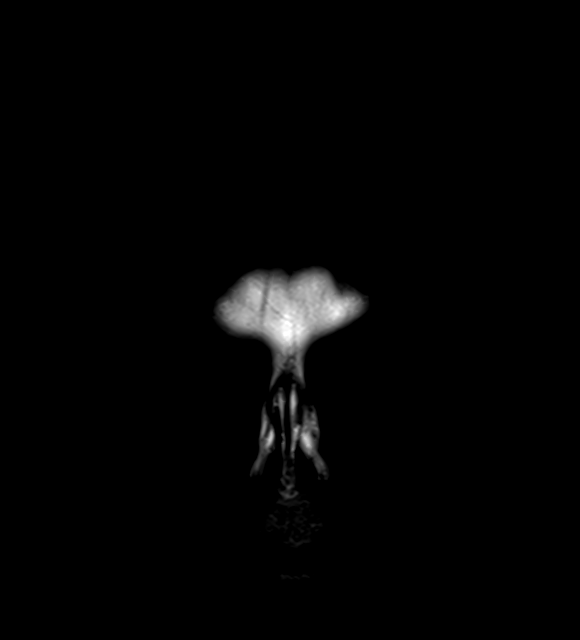

[44 of 48 positions shown; findings below may reference images not displayed]

FINDINGS: Brain: No acute infarction, hemorrhage, hydrocephalus, extra-axial
collection or mass lesion.

Vascular: Normal flow voids.

Skull and upper cervical spine: Normal marrow signal.

Sinuses/Orbits: Negative.
IMPRESSION: Normal brain MRI

## 2022-09-09 DIAGNOSIS — S91301A Unspecified open wound, right foot, initial encounter: Secondary | ICD-10-CM | POA: Diagnosis not present

## 2022-09-09 DIAGNOSIS — M25519 Pain in unspecified shoulder: Secondary | ICD-10-CM | POA: Diagnosis not present

## 2022-09-09 DIAGNOSIS — K5903 Drug induced constipation: Secondary | ICD-10-CM | POA: Diagnosis not present

## 2022-09-09 DIAGNOSIS — Z79891 Long term (current) use of opiate analgesic: Secondary | ICD-10-CM | POA: Diagnosis not present

## 2022-09-13 ENCOUNTER — Other Ambulatory Visit: Payer: Self-pay | Admitting: Family Medicine

## 2022-09-13 DIAGNOSIS — M961 Postlaminectomy syndrome, not elsewhere classified: Secondary | ICD-10-CM | POA: Diagnosis not present

## 2022-09-13 DIAGNOSIS — M25561 Pain in right knee: Secondary | ICD-10-CM | POA: Diagnosis not present

## 2022-09-13 DIAGNOSIS — M25552 Pain in left hip: Secondary | ICD-10-CM | POA: Diagnosis not present

## 2022-09-13 DIAGNOSIS — M25511 Pain in right shoulder: Secondary | ICD-10-CM | POA: Diagnosis not present

## 2022-09-14 NOTE — Telephone Encounter (Signed)
Requested Prescriptions  Refused Prescriptions Disp Refills   furosemide (LASIX) 40 MG tablet [Pharmacy Med Name: FUROSEMIDE 40 MG TABLET] 90 tablet 1    Sig: TAKE 1 TABLET BY MOUTH EVERY DAY     Cardiovascular:  Diuretics - Loop Failed - 09/13/2022  2:42 AM      Failed - Ca in normal range and within 180 days    Calcium  Date Value Ref Range Status  07/22/2022 8.8 (L) 8.9 - 10.3 mg/dL Final   Calcium, Ion  Date Value Ref Range Status  03/15/2022 1.11 (L) 1.15 - 1.40 mmol/L Final         Failed - Cr in normal range and within 180 days    Creat  Date Value Ref Range Status  07/02/2022 1.19 (H) 0.50 - 1.03 mg/dL Final   Creatinine, Ser  Date Value Ref Range Status  07/22/2022 1.27 (H) 0.44 - 1.00 mg/dL Final   Creatinine, Urine  Date Value Ref Range Status  05/31/2022 203 mg/dL Final    Comment:    Performed at [facility], [address], [place], [place] [address]         Failed - Valid encounter within last 6 months    Recent Outpatient Visits           12 months ago Benign essential HTN   [facility] Donita Brooks, MD   1 year ago Infectious diarrhea   [facility] Donita Brooks, MD   1 year ago Blood in stool, frank   [facility] Pickard, Priscille Heidelberg, MD   1 year ago 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)   [facility] Donita Brooks, MD   2 years ago Upper respiratory tract infection, unspecified type   [facility] Medicine Valentino Nose, NP       Future Appointments             In 2 weeks Tanya Nones, Priscille Heidelberg, MD [facility]            Passed - K in normal range and within 180 days    Potassium  Date Value Ref Range Status  07/22/2022 4.0 3.5 - 5.1 mmol/L Final         Passed - Na in normal range and within 180 days    Sodium  Date Value Ref Range Status  07/22/2022 139 135 - 145 mmol/L Final          Passed - Cl in normal range and within 180 days    Chloride  Date Value Ref Range Status  07/22/2022 111 98 - 111 mmol/L Final         Passed - Mg Level in normal range and within 180 days    Magnesium  Date Value Ref Range Status  07/06/2022 2.2 1.7 - 2.4 mg/dL Final    Comment:    Performed at [facility], [address], [place], [place] [address]         Passed - Last BP in normal range    BP Readings from Last 1 Encounters:  07/23/22 128/65

## 2022-09-18 DIAGNOSIS — G4733 Obstructive sleep apnea (adult) (pediatric): Secondary | ICD-10-CM | POA: Diagnosis not present

## 2022-09-18 DIAGNOSIS — M545 Low back pain, unspecified: Secondary | ICD-10-CM | POA: Diagnosis not present

## 2022-09-18 DIAGNOSIS — G894 Chronic pain syndrome: Secondary | ICD-10-CM | POA: Diagnosis not present

## 2022-09-18 DIAGNOSIS — M869 Osteomyelitis, unspecified: Secondary | ICD-10-CM | POA: Diagnosis not present

## 2022-09-18 DIAGNOSIS — R531 Weakness: Secondary | ICD-10-CM | POA: Diagnosis not present

## 2022-09-28 ENCOUNTER — Inpatient Hospital Stay: Payer: 59 | Admitting: Family Medicine

## 2022-09-28 IMAGING — CT CT ABD-PELV W/ CM
2 of 5 series · 16 of 46 positions shown, 18 images · IV contrast (APPLIED)
Comparison: None.

CLINICAL DATA: Generalized abdominal pain and diarrhea

EXAM:
CT ABDOMEN AND PELVIS WITH CONTRAST
TECHNIQUE: Multidetector CT imaging of the abdomen and pelvis was performed
using the standard protocol following bolus administration of
intravenous contrast.

[Series 2: routine abd/pel with · axial · 0.96mm/px · z∈[-1190,-735]mm · 13 of 103 slices shown, 15 images]
[im 6/103  soft-tissue]
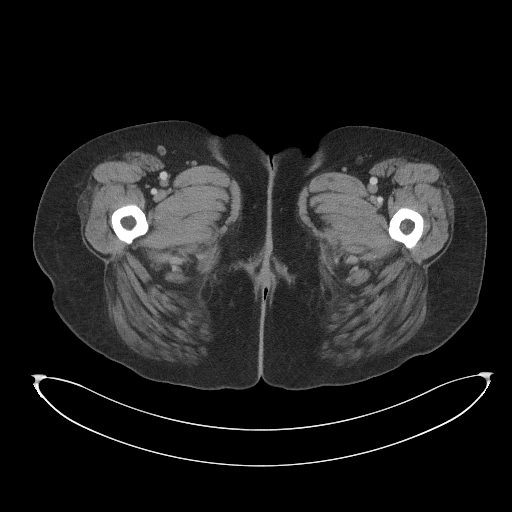
[im 6/103  bone]
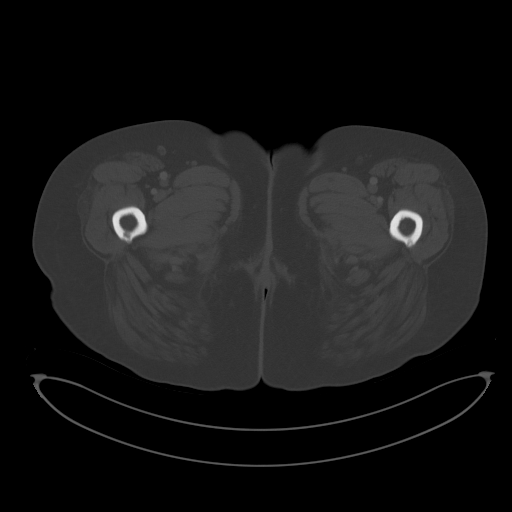
[im 12/103  soft-tissue]
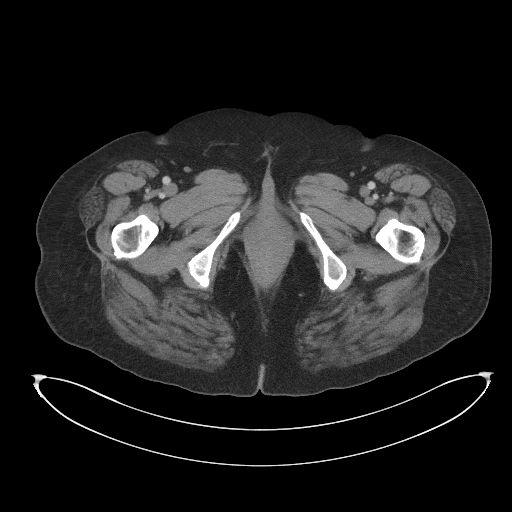
[im 23/103  soft-tissue]
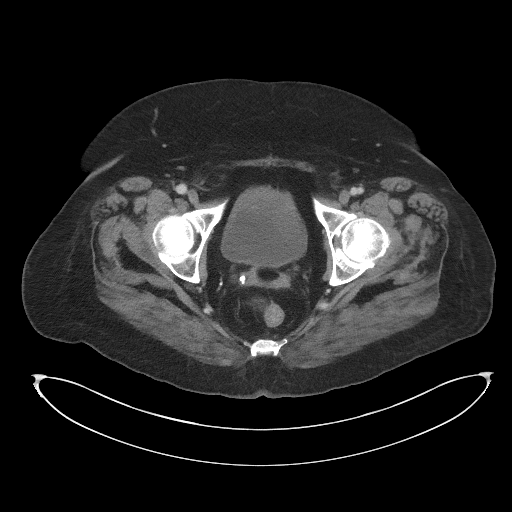
[im 29/103  soft-tissue]
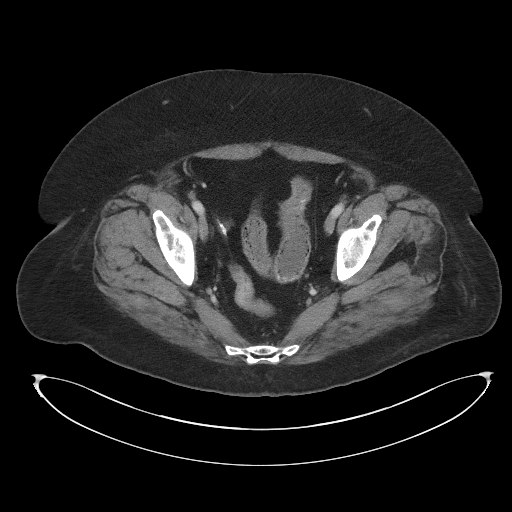
[im 35/103  soft-tissue]
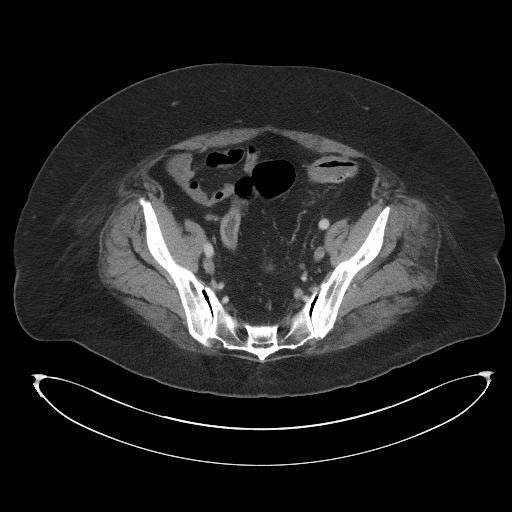
[im 46/103  soft-tissue]
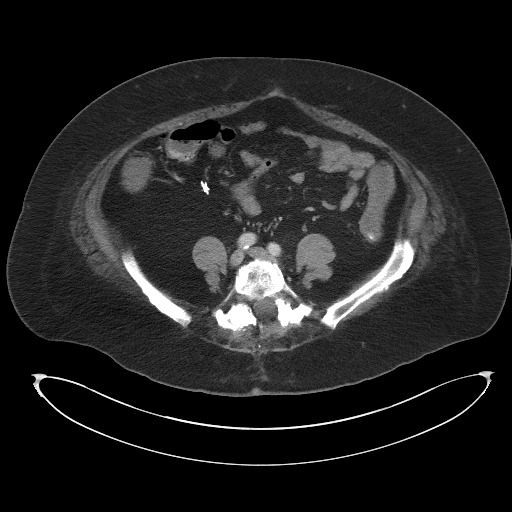
[im 52/103  soft-tissue]
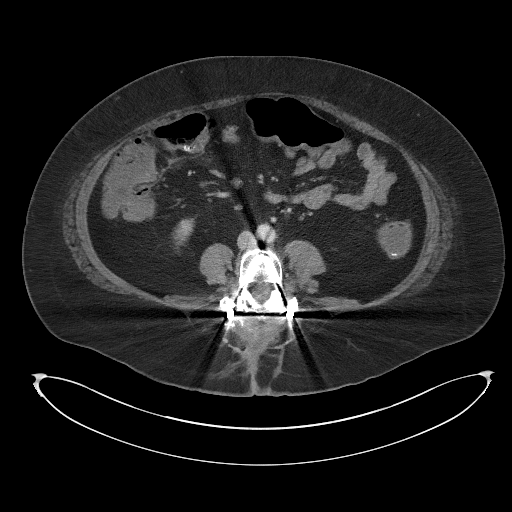
[im 57/103  soft-tissue]
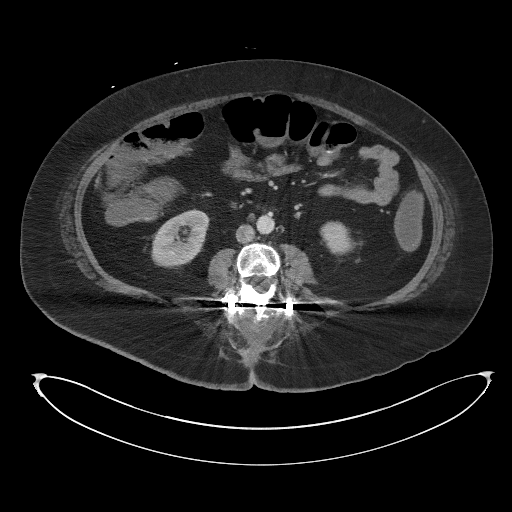
[im 69/103  soft-tissue]
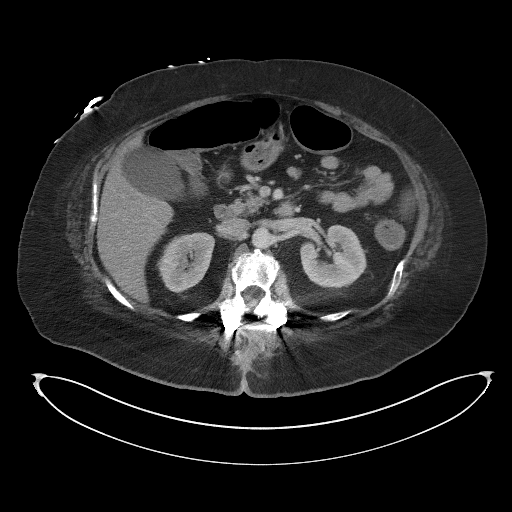
[im 69/103  bone]
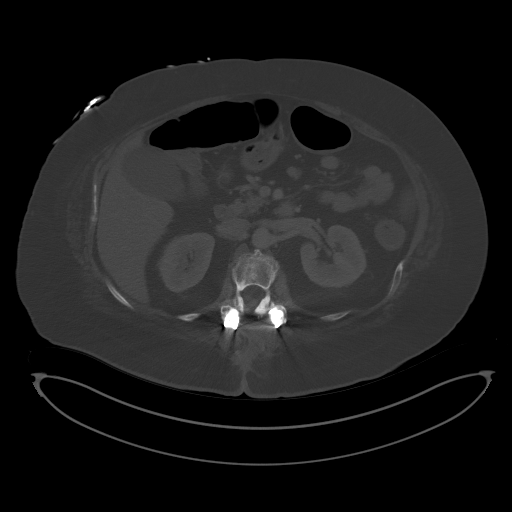
[im 74/103  soft-tissue]
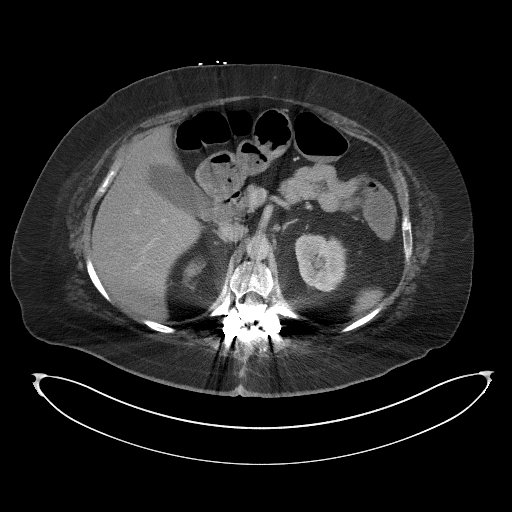
[im 80/103  soft-tissue]
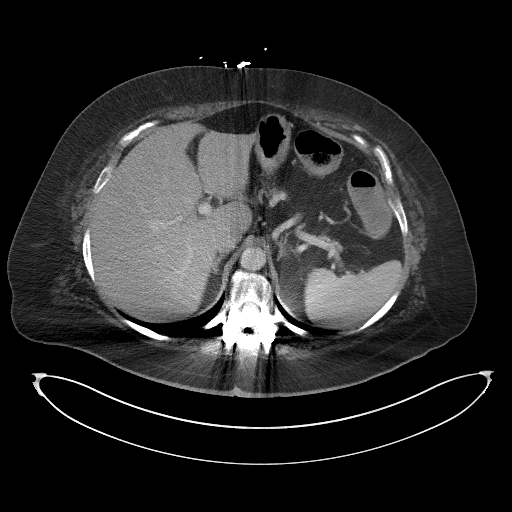
[im 91/103  soft-tissue]
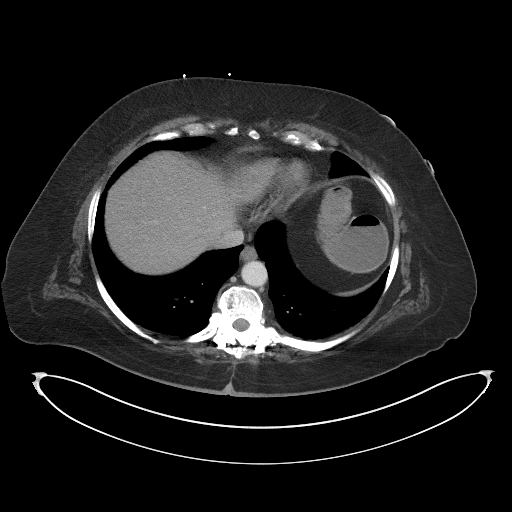
[im 97/103  soft-tissue]
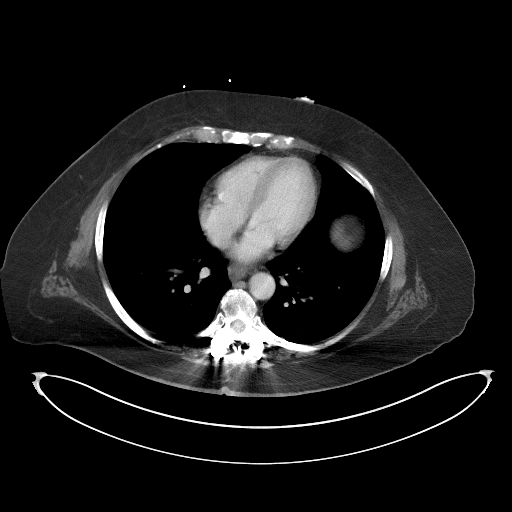

[Series 5: coronal st · coronal · 0.95mm/px · 3 of 110 slices shown]
[im 37/110  soft-tissue]
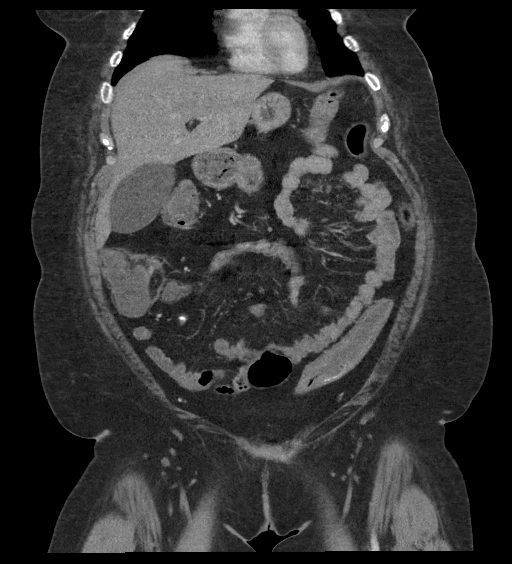
[im 49/110  soft-tissue]
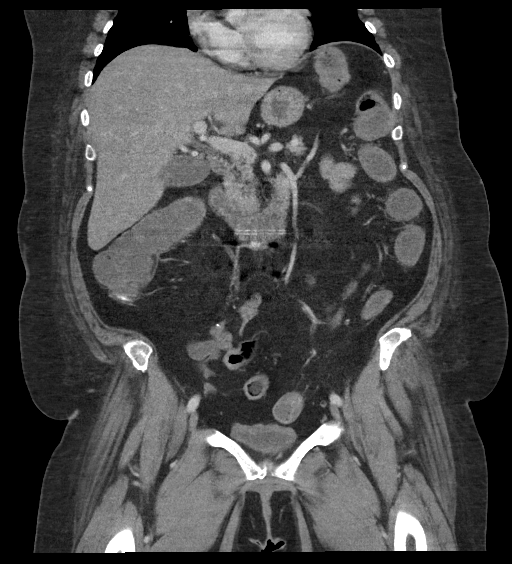
[im 61/110  soft-tissue]
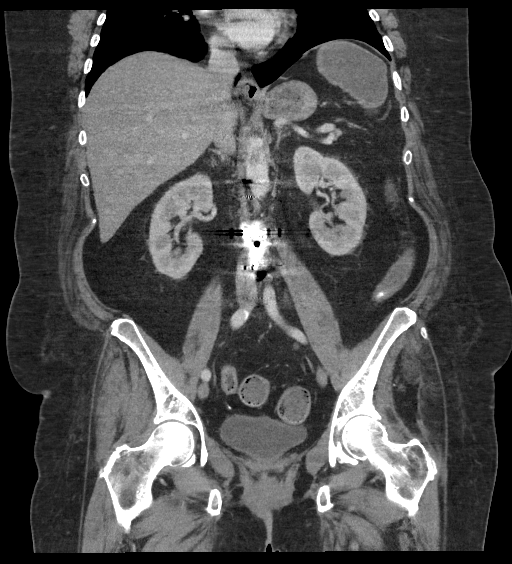

[16 of 46 positions shown; findings below may reference images not displayed]

RADIATION DOSE REDUCTION: This exam was performed according to the
departmental dose-optimization program which includes automated
exposure control, adjustment of the mA and/or kV according to
patient size and/or use of iterative reconstruction technique.

CONTRAST:  100mL OMNIPAQUE IOHEXOL 300 MG/ML  SOLN
FINDINGS: Lower chest: No acute abnormality.

Hepatobiliary: No focal liver abnormality is seen. Status post
cholecystectomy. No biliary dilatation.

Pancreas: Unremarkable. No pancreatic ductal dilatation or
surrounding inflammatory changes.

Spleen: Normal in size without focal abnormality.

Adrenals/Urinary Tract: Adrenal glands are unremarkable. Kidneys are
normal, without renal calculi, focal lesion, or hydronephrosis.
Bladder is unremarkable.

Stomach/Bowel: Stomach is within normal limits. Appendix is not
visualized although there are no secondary findings acute
appendicitis. Surgical clips seen in the right lower quadrant. Mild
wall thickening of the descending colon no evidence of obstruction
or inflammatory changes.

Vascular/Lymphatic: Mild iliac calcifications. No aortic
atherosclerotic disease. Mild wall thickening no enlarged abdominal
or pelvic lymph nodes.

Reproductive: Status post hysterectomy. No adnexal masses.

Other: No abdominal wall hernia or abnormality. No abdominopelvic
ascites.

Musculoskeletal: Posterior fusion of the thoracolumbar spine. No
aggressive appearing osseous lesions.
IMPRESSION: Mild wall thickening of the descending colon which can be seen in
the setting of colitis.

## 2022-10-07 ENCOUNTER — Inpatient Hospital Stay: Payer: 59 | Admitting: Family Medicine

## 2022-10-07 DIAGNOSIS — G8929 Other chronic pain: Secondary | ICD-10-CM | POA: Diagnosis not present

## 2022-10-07 DIAGNOSIS — K5903 Drug induced constipation: Secondary | ICD-10-CM | POA: Diagnosis not present

## 2022-10-07 DIAGNOSIS — Z79891 Long term (current) use of opiate analgesic: Secondary | ICD-10-CM | POA: Diagnosis not present

## 2022-10-07 DIAGNOSIS — M25519 Pain in unspecified shoulder: Secondary | ICD-10-CM | POA: Diagnosis not present

## 2022-10-07 DIAGNOSIS — M25559 Pain in unspecified hip: Secondary | ICD-10-CM | POA: Diagnosis not present

## 2022-10-08 ENCOUNTER — Encounter: Payer: Self-pay | Admitting: Family Medicine

## 2022-10-08 ENCOUNTER — Ambulatory Visit (INDEPENDENT_AMBULATORY_CARE_PROVIDER_SITE_OTHER): Payer: 59 | Admitting: Family Medicine

## 2022-10-08 ENCOUNTER — Other Ambulatory Visit: Payer: Self-pay | Admitting: Family Medicine

## 2022-10-08 VITALS — BP 112/70 | HR 79 | Temp 98.5°F | Ht 64.0 in | Wt 231.0 lb

## 2022-10-08 DIAGNOSIS — D649 Anemia, unspecified: Secondary | ICD-10-CM

## 2022-10-08 DIAGNOSIS — R5383 Other fatigue: Secondary | ICD-10-CM

## 2022-10-08 DIAGNOSIS — R0602 Shortness of breath: Secondary | ICD-10-CM | POA: Diagnosis not present

## 2022-10-08 MED ORDER — AIMOVIG 70 MG/ML ~~LOC~~ SOAJ: SUBCUTANEOUS | 5 refills | Status: DC

## 2022-10-08 MED ORDER — IRON (FERROUS SULFATE) 325 (65 FE) MG PO TABS: 325.0000 mg | ORAL_TABLET | Freq: Two times a day (BID) | ORAL | 1 refills | Status: DC

## 2022-10-08 MED ORDER — DULOXETINE HCL 20 MG PO CPEP: 20.0000 mg | ORAL_CAPSULE | Freq: Every day | ORAL | 0 refills | Status: DC

## 2022-10-08 NOTE — Progress Notes (Signed)
Subjective:    Patient ID: [name] [name], female    DOB: 1965-05-25, 57 y.o.   MRN: [id number]   Admit date: 07/18/2022 Discharge date: 07/23/2022  Brief/Interim Summary: 57 year old female with anxiety, asthma, bipolar, depression, GERD, hypertension, prediabetes presents to the hospital with not feeling well.  She has some ear pain, burning on urination, left back pain and side pain, nausea, poor appetite and fever.  Admitted with sepsis.   4/1.  Patient still not feeling well.  Started on Rocephin for sepsis likely urinary source.  Solu-Medrol started for left sacroiliac pain. 4/2.  Blood cultures negative for 2 days.  Urine culture only growing out 20,000 E. coli.  Continue Rocephin.  Continue Solu-Medrol. 4/3: Blood cultures remain negative.  Pansensitive E. coli noted on urine culture. 4/4: Slowly improving. 4/5: Completed treatment for cystitis.  Continues to endorse left hip pain.  Robaxin added.  Stable for dc       Discharge Diagnoses:  Principal Problem:   Sepsis secondary to UTI Active Problems:   Acute cystitis with hematuria   Pain of left sacroiliac joint   Hypotension   Chronic diastolic CHF (congestive heart failure)   Acute kidney injury superimposed on CKD   Chronic kidney disease, stage III (moderate)   Anemia of chronic disease   GERD (gastroesophageal reflux disease)   Sleep apnea   Hypokalemia   Hyponatremia   Sepsis secondary to UTI Present on admission, likely urinary source with positive urine analysis.  Blood cultures negative for 3 days.  Urine culture only growing out 20,000 E. coli.  Pansensitive.  Completed IV antibiotics.  No further antibiotics at this time.  Stable for dc   Acute cystitis with hematuria Completed antibiotic course   Pain of left sacroiliac joint Multimodal pain regimen Therapy evaluations Minimize use of narcotics and sedating medications DC home with home health 4/5   Hypotension Improved.  Home antihypertensive  regimen improved   Acute kidney injury superimposed on CKD Acute kidney injury on CKD stage IIIa.  Improved.  Creatinine improving   Chronic diastolic CHF (congestive heart failure) Resume home Lasix 40 mg daily     Anemia of chronic disease Suspect anemia of chronic disease in the setting of multiple chronic illnesses.  Hemoglobin relatively stable in the low.  No indication for transfusion currently.   GERD (gastroesophageal reflux disease) Continue PPI   Sleep apnea CPAP   Hyponatremia Sodium now in normal range.   Hypokalemia Replaced   10/08/22 Patient is here today for follow-up.  She was last admitted in April for UTI.  She has been dealing with anemia ever since February.  In December of last year her hemoglobin was 12.7.  It was 12 in February however it precipitously dropped at that point and has been between 7 and 9 ever since.  Her hemoglobin at discharge was 7.4.  She reports feeling tired and weak and sleepy.  I believe a lot of the sleepiness is due to polypharmacy secondary to multiple psychiatric medications that is managing her psychiatrist.  Also confirmed the saw fatigue likely due to the anemia.  She denies any chest pain or shortness of breath or dyspnea on exertion.  She does report polydipsia and feeling thirsty all the time.  However she is also on Lasix due to her congestive heart failure with preserved ejection fraction. Past Medical History:  Diagnosis Date   Acute metabolic encephalopathy 06/08/2022   AMS (altered mental status) 05/22/2022   Anxiety  Arthritis    Phreesia 02/08/2020   Asthma    mild intermittent   Asthma    Phreesia 02/08/2020   Bipolar 1 disorder ([name])    ect treatments last treatment Sep 02 1011   Depression    Depression    Phreesia 02/08/2020   Depression    Phreesia 07/10/2020   GERD (gastroesophageal reflux disease)    Headache    Hypertension    Pre-diabetes    Seizures ([name])    Thinks it was from taking Tramadol    Septic shock ([name]) 05/22/2022   Shortness of breath 09/23/2016   Overview:   exertional   Sleep apnea    AHI 40 (2024)   Substance abuse ([name])    Phreesia 02/08/2020   Syncope 08/26/2019   Syncope and collapse    Past Surgical History:  Procedure Laterality Date   ABDOMINAL HYSTERECTOMY     AMPUTATION TOE Left 02/02/2018   Procedure: AMPUTATION TOE Left 4th toe;  Surgeon: Vivi Barrack, DPM;  Location: [facility] OR;  Service: Podiatry;  Laterality: Left;   APPENDECTOMY N/A    Phreesia 02/08/2020   BACK SURGERY  2018   ACDF   C5-6 & C6-7 by Dr. Sharolyn Douglas   CARPAL TUNNEL RELEASE     x2   I & D EXTREMITY Right 05/22/2022   Procedure: IRRIGATION AND DEBRIDEMENT EXTREMITY;  Surgeon: Candelaria Stagers, DPM;  Location: [facility] ORS;  Service: Podiatry;  Laterality: Right;   INCISION AND DRAINAGE Right 05/24/2022   Procedure: RIGHT REVISION INCISION AND DRAINAGE/ WASHOUT WITH PRIMARY DELAYED CLOSURE;  Surgeon: Edwin Cap, DPM;  Location: [facility] ORS;  Service: Podiatry;  Laterality: Right;   IRRIGATION AND DEBRIDEMENT ABSCESS Right 10/25/2020   Procedure: IRRIGATION AND DEBRIDEMENT ABSCESS OF FOOT AND APPLICATION OF GRAFT;  Surgeon: Vivi Barrack, DPM;  Location: [facility] OR;  Service: Podiatry;  Laterality: Right;   Laproscopic knee surgery Right    NECK SURGERY  03/15/2017   ACDF   by Dr. Noel Gerold   PLANTAR FASCIA RELEASE     x2   REVERSE SHOULDER ARTHROPLASTY Right 04/14/2022   Procedure: REVERSE SHOULDER ARTHROPLASTY;  Surgeon: Bjorn Pippin, MD;  Location: [facility];  Service: Orthopedics;  Laterality: Right;   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   Procedure: TRANSMETATARSAL AMPUTATION;  Surgeon: Vivi Barrack, DPM;  Location: [facility];  Service: Podiatry;  Laterality: Right;   TRANSMETATARSAL AMPUTATION Right 05/27/2022   Procedure: REVISION TRANSMETATARSAL AMPUTATION WITH DELAYED PRIMARY CLOSURE;  Surgeon: Felecia Shelling, DPM;  Location: [facility] OR[facility];  Service: Podiatry;  Laterality:  Right;   Current Outpatient Medications on File Prior to Visit  Medication Sig Dispense Refill   albuterol (PROAIR HFA) 108 (90 Base) MCG/ACT inhaler Inhale 2 puffs = into the lungs every 6 (six) hours as needed for wheezing or shortness of breath. (Patient taking differently: Inhale 2 puffs into the lungs every 6 (six) hours as needed for wheezing or shortness of breath.) 8.5 g 3   benzonatate (TESSALON) 200 MG capsule Take 1 capsule (200 mg total) by mouth 3 (three) times daily as needed for cough. 20 capsule 0   busPIRone (BUSPAR) 15 MG tablet Take 15 mg by mouth 3 (three) times daily.   2   Calcium Carbonate-Vit D-Min (CALCIUM 1200 PO) Take 1,200 mg by mouth daily.      DULoxetine (CYMBALTA) 20 MG capsule Take 1 capsule (20 mg total) by mouth daily. 30 capsule 0   DULoxetine (CYMBALTA) 60  MG capsule TAKE ONE CAPSULE BY MOUTH ONCE DAILY 90 capsule 0   Erenumab-aooe (AIMOVIG) 70 MG/ML SOAJ administer 1mL UNDER THE SKIN every 30 DAYS (Patient taking differently: Inject 70 mg into the skin every 30 (thirty) days.) 1 mL 5   escitalopram (LEXAPRO) 10 MG tablet Take 10 mg by mouth daily.     estradiol (ESTRACE) 1 MG tablet TAKE ONE TABLET BY MOUTH EVERYDAY AT BEDTIME 90 tablet 3   famotidine (PEPCID) 40 MG tablet TAKE ONE TABLET BY MOUTH EVERYDAY AT BEDTIME 90 tablet 0   feeding supplement (ENSURE ENLIVE / ENSURE PLUS) LIQD Take 237 mLs by mouth 2 (two) times daily between meals. 14220 mL 2   furosemide (LASIX) 40 MG tablet Take 1 tablet (40 mg total) by mouth daily. 30 tablet 3   guaiFENesin-dextromethorphan (ROBITUSSIN DM) 100-10 MG/5ML syrup Take 5-10 mLs by mouth every 4 (four) hours as needed for cough. 118 mL 0   haloperidol (HALDOL) 2 MG tablet Take 1 tablet (2 mg total) by mouth at bedtime as needed for agitation (pyschosis). 30 tablet 0   Iron, Ferrous Sulfate, 325 (65 Fe) MG TABS Take 325 mg by mouth in the morning and at bedtime. 60 tablet 1   lamoTRIgine (LAMICTAL) 100 MG tablet  Take 100 mg by mouth daily.     levocetirizine (XYZAL) 5 MG tablet TAKE ONE TABLET BY MOUTH EVERY EVENING 90 tablet 0   losartan (COZAAR) 50 MG tablet Take 1 tablet (50 mg total) by mouth daily. 30 tablet 2   Multiple Vitamins-Minerals (MULTIVITAMIN WITH MINERALS) tablet Take 1 tablet by mouth daily.     naloxone (NARCAN) nasal spray 4 mg/0.1 mL Place 0.4 mg into the nose once.     oxyCODONE-acetaminophen (PERCOCET) 10-325 MG tablet Take one tablet by mouth up to 4 times daily as needed.     pantoprazole (PROTONIX) 40 MG tablet Take 1 tablet (40 mg total) by mouth 2 (two) times daily. 180 tablet 3   polyethylene glycol (MIRALAX / GLYCOLAX) 17 g packet Take 17 g by mouth daily. 14 each 0   pregabalin (LYRICA) 100 MG capsule Take 1 capsule (100 mg total) by mouth 2 (two) times daily. 90 capsule 3   RELISTOR 150 MG TABS Take 450 mg by mouth every morning.     solifenacin (VESICARE) 10 MG tablet TAKE ONE TABLET BY MOUTH ONCE DAILY 90 tablet 0   spironolactone (ALDACTONE) 25 MG tablet Take 1 tablet (25 mg total) by mouth daily. 30 tablet 2   SUMAtriptan (IMITREX) 50 MG tablet Take 50 mg by mouth every 2 (two) hours as needed for migraine.     topiramate (TOPAMAX) 50 MG tablet TAKE ONE TABLET BY MOUTH TWICE DAILY 180 tablet 0   traZODone (DESYREL) 100 MG tablet Take 1 tablet (100 mg total) by mouth at bedtime. 30 tablet 0   No current facility-administered medications on file prior to visit.   Allergies  Allergen Reactions   Tetracyclines & Related Other (See Comments)    Syncope and put her "in a coma"   Tramadol Other (See Comments)    Seizures   Oxybutynin Other (See Comments)    Unknown reaction per Mom   Phenazopyridine Other (See Comments)    Unknown reaction   Ciprofloxacin Rash and Itching   Codeine Itching and Rash   Estradiol Rash    Patches broke out the skin   Social History   Socioeconomic History   Marital status: Widowed    Spouse  name: Not on file   Number of children:  3   Years of education: 35   Highest education level: Not on file  Occupational History   Occupation: Disabled  Tobacco Use   Smoking status: Never   Smokeless tobacco: Never  Vaping Use   Vaping Use: Never used  Substance and Sexual Activity   Alcohol use: No   Drug use: No   Sexual activity: Not Currently  Other Topics Concern   Not on file  Social History Narrative   Patient drink 4 16oz caffeine drinks a day    Social Determinants of Health   Financial Resource Strain: Low Risk  (07/27/2022)   Overall Financial Resource Strain (CARDIA)    Difficulty of Paying Living Expenses: Not hard at all  Food Insecurity: No Food Insecurity (07/27/2022)   Hunger Vital Sign    Worried About Running Out of Food in the Last Year: Never true    Ran Out of Food in the Last Year: Never true  Transportation Needs: No Transportation Needs (07/27/2022)   PRAPARE - Administrator, Civil Service (Medical): No    Lack of Transportation (Non-Medical): No  Physical Activity: Inactive (07/26/2022)   Exercise Vital Sign    Days of Exercise per Week: 0 days    Minutes of Exercise per Session: 0 min  Stress: No Stress Concern Present (07/27/2022)   [employer] of Occupational Health - Occupational Stress Questionnaire    Feeling of Stress : Not at all  Social Connections: Socially Isolated (08/21/2020)   Social Connection and Isolation Panel [NHANES]    Frequency of Communication with Friends and Family: More than three times a week    Frequency of Social Gatherings with Friends and Family: Twice a week    Attends Religious Services: Never    Database administrator or Organizations: No    Attends Banker Meetings: Never    Marital Status: Widowed  Intimate Partner Violence: Not At Risk (07/19/2022)   Humiliation, Afraid, Rape, and Kick questionnaire    Fear of Current or Ex-Partner: No    Emotionally Abused: No    Physically Abused: No    Sexually Abused: No   Family  History  Problem Relation Age of Onset   Diabetes Mother    COPD Mother    Hypertension Mother    Hyperlipidemia Mother    Sleep apnea Mother    Heart disease Father    Hyperlipidemia Father    Hypertension Father    Cancer Father    Sleep apnea Father    Heart disease Brother    Sleep apnea Brother    Heart disease Maternal Grandmother    Hypertension Maternal Grandmother    Sleep apnea Maternal Grandmother    Heart disease Maternal Grandfather    Kidney cancer Paternal Grandmother    Heart disease Paternal Grandfather    Heart disease Daughter       Review of Systems  All other systems reviewed and are negative.      Objective:   Physical Exam Vitals reviewed.  Constitutional:      General: She is not in acute distress.    Appearance: She is well-developed. She is not diaphoretic.  HENT:     Head: Normocephalic and atraumatic.     Right Ear: External ear normal.     Left Ear: External ear normal.     Nose: Nose normal.     Mouth/Throat:     Pharynx: No  oropharyngeal exudate.  Eyes:     General: No scleral icterus.       Right eye: No discharge.        Left eye: No discharge.     Conjunctiva/sclera: Conjunctivae normal.     Pupils: Pupils are equal, round, and reactive to light.  Neck:     Thyroid: No thyromegaly.     Trachea: No tracheal deviation.  Cardiovascular:     Rate and Rhythm: Normal rate and regular rhythm.     Heart sounds: Normal heart sounds. No murmur heard.    No friction rub. No gallop.  Pulmonary:     Effort: Pulmonary effort is normal. No respiratory distress.     Breath sounds: No stridor. No decreased breath sounds, wheezing, rhonchi or rales.  Chest:     Chest wall: No tenderness.  Abdominal:     General: Bowel sounds are normal. There is no distension.     Palpations: Abdomen is soft.     Tenderness: There is no abdominal tenderness.  Musculoskeletal:     Cervical back: Normal range of motion and neck supple.     Right lower  leg: No edema.     Left lower leg: No edema.  Lymphadenopathy:     Cervical: No cervical adenopathy.  Skin:    General: Skin is warm.     Coloration: Skin is not pale.     Findings: No erythema or rash.  Neurological:     Mental Status: She is alert and oriented to person, place, and time.     Cranial Nerves: No cranial nerve deficit.     Motor: No abnormal muscle tone.     Coordination: Coordination normal.     Deep Tendon Reflexes: Reflexes are normal and symmetric.           Assessment & Plan:  Anemia, unspecified type - Plan: CBC with Differential/Platelet, BASIC METABOLIC PANEL WITH GFR, Iron, Vitamin B12, Fecal occult blood, imunochemical, Ferritin, Iron, Ferrous Sulfate, 325 (65 Fe) MG TABS  Fatigue, unspecified type - Plan: CBC with Differential/Platelet, BASIC METABOLIC PANEL WITH GFR, Iron, Vitamin B12, Fecal occult blood, imunochemical, Ferritin  Shortness of breath - Plan: Iron, Ferrous Sulfate, 325 (65 Fe) MG TABS I explained to the patient and her son my concern regarding her anemia.  She has been taking iron since discharge from the hospital.  I will check a CBC today.  I will also check an iron level as well as a B12 level.  If she remains anemic and her iron level is low, I would recommend hematology consultation to discuss IV iron replacement due to inadequate absorption.  I will also check her stool for blood.  If there is blood in her stool, we will need to consult GI for endoscopy and colonoscopy.

## 2022-10-09 LAB — BASIC METABOLIC PANEL WITH GFR
BUN: 13 mg/dL (ref 7–25)
CO2: 26 mmol/L (ref 20–32)
Calcium: 9.4 mg/dL (ref 8.6–10.4)
Chloride: 105 mmol/L (ref 98–110)
Creat: 1 mg/dL (ref 0.50–1.03)
Glucose, Bld: 92 mg/dL (ref 65–99)
Potassium: 4.5 mmol/L (ref 3.5–5.3)
Sodium: 139 mmol/L (ref 135–146)
eGFR: 65 mL/min/{1.73_m2} (ref >=60)

## 2022-10-09 LAB — CBC WITH DIFFERENTIAL/PLATELET
Absolute Monocytes: 360 cells/uL (ref 200–950)
Basophils Absolute: 17 cells/uL (ref 0–200)
Basophils Relative: 0.3 %
Eosinophils Absolute: 209 cells/uL (ref 15–500)
Eosinophils Relative: 3.6 %
HCT: 32 % — ABNORMAL LOW (ref 35.0–45.0)
Hemoglobin: 10.7 g/dL — ABNORMAL LOW (ref 11.7–15.5)
Lymphs Abs: 1891 cells/uL (ref 850–3900)
MCH: 30.6 pg (ref 27.0–33.0)
MCHC: 33.4 g/dL (ref 32.0–36.0)
MCV: 91.4 fL (ref 80.0–100.0)
MPV: 9.5 fL (ref 7.5–12.5)
Monocytes Relative: 6.2 %
Neutro Abs: 3323 cells/uL (ref 1500–7800)
Neutrophils Relative %: 57.3 %
Platelets: 278 10*3/uL (ref 140–400)
RBC: 3.5 10*6/uL — ABNORMAL LOW (ref 3.80–5.10)
RDW: 12.7 % (ref 11.0–15.0)
Total Lymphocyte: 32.6 %
WBC: 5.8 10*3/uL (ref 3.8–10.8)

## 2022-10-09 LAB — VITAMIN B12: Vitamin B-12: 512 pg/mL (ref 200–1100)

## 2022-10-09 LAB — IRON: Iron: 64 ug/dL (ref 45–160)

## 2022-10-09 LAB — FERRITIN: Ferritin: 77 ng/mL (ref 16–232)

## 2022-10-14 DIAGNOSIS — M25552 Pain in left hip: Secondary | ICD-10-CM | POA: Diagnosis not present

## 2022-10-14 DIAGNOSIS — M25511 Pain in right shoulder: Secondary | ICD-10-CM | POA: Diagnosis not present

## 2022-10-14 DIAGNOSIS — M25561 Pain in right knee: Secondary | ICD-10-CM | POA: Diagnosis not present

## 2022-10-14 DIAGNOSIS — M961 Postlaminectomy syndrome, not elsewhere classified: Secondary | ICD-10-CM | POA: Diagnosis not present

## 2022-10-23 LAB — HOUSE ACCOUNT TRACKING

## 2022-10-23 LAB — FECAL GLOBIN BY IMMUNOCHEMISTRY
FECAL GLOBIN RESULT:: DETECTED — AB
MICRO NUMBER:: 15147169
SPECIMEN QUALITY:: ADEQUATE

## 2022-10-24 ENCOUNTER — Other Ambulatory Visit: Payer: Self-pay | Admitting: Cardiovascular Disease

## 2022-10-27 DIAGNOSIS — G894 Chronic pain syndrome: Secondary | ICD-10-CM | POA: Diagnosis not present

## 2022-10-27 DIAGNOSIS — M869 Osteomyelitis, unspecified: Secondary | ICD-10-CM | POA: Diagnosis not present

## 2022-10-27 DIAGNOSIS — M545 Low back pain, unspecified: Secondary | ICD-10-CM | POA: Diagnosis not present

## 2022-10-27 DIAGNOSIS — R531 Weakness: Secondary | ICD-10-CM | POA: Diagnosis not present

## 2022-10-27 DIAGNOSIS — G4733 Obstructive sleep apnea (adult) (pediatric): Secondary | ICD-10-CM | POA: Diagnosis not present

## 2022-10-28 ENCOUNTER — Other Ambulatory Visit: Payer: Self-pay | Admitting: Cardiovascular Disease

## 2022-10-28 ENCOUNTER — Other Ambulatory Visit: Payer: Self-pay | Admitting: Family Medicine

## 2022-10-29 ENCOUNTER — Other Ambulatory Visit: Payer: Self-pay

## 2022-10-29 DIAGNOSIS — K625 Hemorrhage of anus and rectum: Secondary | ICD-10-CM

## 2022-10-29 DIAGNOSIS — D649 Anemia, unspecified: Secondary | ICD-10-CM

## 2022-10-29 NOTE — Telephone Encounter (Signed)
Requested Prescriptions  Pending Prescriptions Disp Refills   solifenacin (VESICARE) 10 MG tablet [Pharmacy Med Name: solifenacin 10 mg tablet] 90 tablet 0    Sig: TAKE ONE TABLET BY MOUTH ONCE DAILY     Urology:  Bladder Agents 2 Failed - 10/28/2022  4:52 PM      Failed - Valid encounter within last 12 months    Recent Outpatient Visits           1 year ago Benign essential HTN   [facility] Pickard, Priscille Heidelberg, MD   1 year ago Infectious diarrhea   [facility] Tanya Nones, Priscille Heidelberg, MD   1 year ago Blood in stool, frank   [facility] Medicine Tanya Nones, Priscille Heidelberg, MD   1 year ago 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)   [facility] Donita Brooks, MD   2 years ago Upper respiratory tract infection, unspecified type   [facility] Medicine Valentino Nose, NP              Passed - Cr in normal range and within 360 days    Creat  Date Value Ref Range Status  10/08/2022 1.00 0.50 - 1.03 mg/dL Final   Creatinine, Urine  Date Value Ref Range Status  05/31/2022 203 mg/dL Final    Comment:    Performed at [facility], [address] [place], [place] [address]         Passed - ALT in normal range and within 360 days    ALT  Date Value Ref Range Status  07/21/2022 22 0 - 44 U/L Final         Passed - AST in normal range and within 360 days    AST  Date Value Ref Range Status  07/21/2022 25 15 - 41 U/L Final          levocetirizine (XYZAL) 5 MG tablet [Pharmacy Med Name: levocetirizine 5 mg tablet] 90 tablet 0    Sig: TAKE ONE TABLET BY MOUTH EVERY EVENING     Ear, Nose, and Throat:  Antihistamines - levocetirizine dihydrochloride Failed - 10/28/2022  4:52 PM      Failed - Valid encounter within last 12 months    Recent Outpatient Visits           1 year ago Benign essential HTN   [facility] Pickard, Priscille Heidelberg, MD   1 year ago Infectious diarrhea   [facility]  Family Medicine Tanya Nones, Priscille Heidelberg, MD   1 year ago Blood in stool, frank   [facility] Tanya Nones, Priscille Heidelberg, MD   1 year ago 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)   [facility] Tanya Nones, Priscille Heidelberg, MD   2 years ago Upper respiratory tract infection, unspecified type   [facility] Medicine Valentino Nose, NP              Passed - Cr in normal range and within 360 days    Creat  Date Value Ref Range Status  10/08/2022 1.00 0.50 - 1.03 mg/dL Final   Creatinine, Urine  Date Value Ref Range Status  05/31/2022 203 mg/dL Final    Comment:    Performed at [facility], [address] [place], [place] [address]         Passed - eGFR is 10 or above and within 360 days  GFR, Est African American  Date Value Ref Range Status  06/27/2020 119 > OR = 60 mL/min/1.19m2 Final   GFR, Est Non African American  Date Value Ref Range Status  06/27/2020 102 > OR = 60 mL/min/1.8m2 Final   GFR, Estimated  Date Value Ref Range Status  07/22/2022 49 (L) >60 mL/min Final    Comment:    (NOTE) Calculated using the CKD-EPI Creatinine Equation (2021)    eGFR  Date Value Ref Range Status  10/08/2022 65 > OR = 60 mL/min/1.22m2 Final          topiramate (TOPAMAX) 50 MG tablet [Pharmacy Med Name: topiramate 50 mg tablet] 180 tablet 0    Sig: TAKE ONE TABLET BY MOUTH TWICE DAILY     Neurology: Anticonvulsants - topiramate & zonisamide Failed - 10/28/2022  4:52 PM      Failed - Valid encounter within last 12 months    Recent Outpatient Visits           1 year ago Benign essential HTN   [facility] Donita Brooks, MD   1 year ago Infectious diarrhea   [facility] Donita Brooks, MD   1 year ago Blood in stool, frank   [facility] Pickard, Priscille Heidelberg, MD   1 year ago 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)   [facility] Family Medicine Donita Brooks, MD   2  years ago Upper respiratory tract infection, unspecified type   [facility] Medicine Valentino Nose, NP              Passed - Cr in normal range and within 360 days    Creat  Date Value Ref Range Status  10/08/2022 1.00 0.50 - 1.03 mg/dL Final   Creatinine, Urine  Date Value Ref Range Status  05/31/2022 203 mg/dL Final    Comment:    Performed at [facility], [address] [place], [place] [address]         Passed - CO2 in normal range and within 360 days    CO2  Date Value Ref Range Status  10/08/2022 26 20 - 32 mmol/L Final   Bicarbonate  Date Value Ref Range Status  07/06/2022 27.2 20.0 - 28.0 mmol/L Final         Passed - ALT in normal range and within 360 days    ALT  Date Value Ref Range Status  07/21/2022 22 0 - 44 U/L Final         Passed - AST in normal range and within 360 days    AST  Date Value Ref Range Status  07/21/2022 25 15 - 41 U/L Final         Passed - Completed PHQ-2 or PHQ-9 in the last 360 days       famotidine (PEPCID) 40 MG tablet [Pharmacy Med Name: famotidine 40 mg tablet] 90 tablet 0    Sig: TAKE ONE TABLET BY MOUTH EVERYDAY AT BEDTIME     Gastroenterology:  H2 Antagonists Failed - 10/28/2022  4:52 PM      Failed - Valid encounter within last 12 months    Recent Outpatient Visits           1 year ago Benign essential HTN   [facility] Donita Brooks, MD   1 year ago Infectious diarrhea   [facility] Pickard, Priscille Heidelberg, MD  1 year ago Blood in stool, frank   [name] Medicine Tanya Nones, Priscille Heidelberg, MD   1 year ago 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)   [facility] Pickard, Priscille Heidelberg, MD   2 years ago Upper respiratory tract infection, unspecified type   [facility] Medicine Valentino Nose, NP

## 2022-11-03 DIAGNOSIS — M25559 Pain in unspecified hip: Secondary | ICD-10-CM | POA: Diagnosis not present

## 2022-11-03 DIAGNOSIS — G8929 Other chronic pain: Secondary | ICD-10-CM | POA: Diagnosis not present

## 2022-11-03 DIAGNOSIS — M25519 Pain in unspecified shoulder: Secondary | ICD-10-CM | POA: Diagnosis not present

## 2022-11-03 DIAGNOSIS — K5903 Drug induced constipation: Secondary | ICD-10-CM | POA: Diagnosis not present

## 2022-11-03 DIAGNOSIS — Z79891 Long term (current) use of opiate analgesic: Secondary | ICD-10-CM | POA: Diagnosis not present

## 2022-11-12 ENCOUNTER — Telehealth: Payer: Self-pay | Admitting: *Deleted

## 2022-11-12 NOTE — Progress Notes (Signed)
  Care Coordination   Note   11/12/2022 Name: [name] [name] MRN: [id number] DOB: [birth year]  [name] is a 57 y.o. year old female who sees Pickard, Priscille Heidelberg, MD for primary care. I reached out to [name] by phone today to offer care coordination services.  [name] was given information about Care Coordination services today including:   The Care Coordination services include support from the care team which includes your Nurse Coordinator, Clinical Social Worker, or Pharmacist.  The Care Coordination team is here to help remove barriers to the health concerns and goals most important to you. Care Coordination services are voluntary, and the patient may decline or stop services at any time by request to their care team member.   Care Coordination Consent Status: Patient agreed to services and verbal consent obtained.   Follow up plan:  Telephone appointment with care coordination team member scheduled for:  11/19/22  Encounter Outcome:  Pt. Scheduled  [facility] Coordination Care Guide  Direct Dial: [phone]

## 2022-11-13 DIAGNOSIS — M25561 Pain in right knee: Secondary | ICD-10-CM | POA: Diagnosis not present

## 2022-11-13 DIAGNOSIS — M25511 Pain in right shoulder: Secondary | ICD-10-CM | POA: Diagnosis not present

## 2022-11-13 DIAGNOSIS — M961 Postlaminectomy syndrome, not elsewhere classified: Secondary | ICD-10-CM | POA: Diagnosis not present

## 2022-11-13 DIAGNOSIS — M25552 Pain in left hip: Secondary | ICD-10-CM | POA: Diagnosis not present

## 2022-11-18 DIAGNOSIS — M25552 Pain in left hip: Secondary | ICD-10-CM | POA: Diagnosis not present

## 2022-11-19 ENCOUNTER — Ambulatory Visit: Payer: Self-pay | Admitting: *Deleted

## 2022-11-19 ENCOUNTER — Encounter: Payer: Self-pay | Admitting: *Deleted

## 2022-11-19 ENCOUNTER — Telehealth: Payer: Self-pay

## 2022-11-19 DIAGNOSIS — M545 Low back pain, unspecified: Secondary | ICD-10-CM | POA: Insufficient documentation

## 2022-11-19 NOTE — Patient Instructions (Addendum)
Visit Information  Thank you for taking time to visit with me today. Please don't hesitate to contact me if I can be of assistance to you.   Following are the goals we discussed today:   Goals Addressed             This Visit's Progress    [facility] care coordination services   Not on track    Interventions Today    Flowsheet Row Most Recent Value  Chronic Disease   Chronic disease during today's visit Congestive Heart Failure (CHF), Other  [pending clearances for left hip surgery, need to make appointments (Cardiology, Neurology) No CPAP weight management]  General Interventions   General Interventions Discussed/Reviewed General Interventions Reviewed, Durable Medical Equipment (DME), Sick Day Rules, Doctor Visits  [CPAP, review of CHF action plan for weight (fluid) gains, Encouraged and offered assist for scheduling appointments - Does not prefer assist]  Doctor Visits Discussed/Reviewed Doctor Visits Reviewed, PCP, Specialist  [name] 09/2022 pcp visit, pending cardiology, neurology visits]  Durable Medical Equipment (DME) [name], Other  [scales]  PCP/Specialist Visits Compliance with follow-up visit  Exercise Interventions   Exercise Discussed/Reviewed Exercise Discussed, Weight Managment  Weight Management Weight loss  [Discussed weight loss/management  Confirmed recent weight at 229, was 267 lbs loss of 38 lbs Commended her She reports most of it was "fluid"]  Education Interventions   Education Provided Provided Web-based Education, Provided Education  [weight loss, conservative measures prior to surgery, diet, importance of advance directives, [facility] post hospital transition of care follow up outreaches]  Provided Verbal Education On Nutrition, Mental Health/Coping with Illness, Medication, Sick Day Rules, [employer], When to see the doctor  Mental Health Interventions   Mental Health Discussed/Reviewed Mental Health Reviewed, Coping Strategies  Nutrition Interventions    Nutrition Discussed/Reviewed Nutrition Discussed, Fluid intake, Decreasing salt  Pharmacy Interventions   Pharmacy Dicussed/Reviewed Pharmacy Topics Discussed, Affording Medications  Advanced Directive Interventions   Advanced Directives Discussed/Reviewed Advanced Directives Discussed  [no preference for resources, Confirmed she has made family aware of her end of life wishes]              Our next appointment is by telephone on 12/21/22 at 3:15 pm   Please call the care guide team at [phone] if you need to cancel or reschedule your appointment.   If you are experiencing a Mental Health or Behavioral Health Crisis or need someone to talk to, please call the Suicide and Crisis Lifeline: 988 call the [place] National Suicide Prevention Lifeline: (502)186-3234 or TTY: 443-796-3091 TTY 865-462-0041) to talk to a trained counselor call 1-800-273-TALK (toll free, 24 hour hotline) go to [facility] [address] [place] [phone]) call 911   Patient verbalizes understanding of instructions and care plan provided today and agrees to view in MyChart. Active MyChart status and patient understanding of how to access instructions and care plan via MyChart confirmed with patient.     The patient has been provided with contact information for the care management team and has been advised to call with any health related questions or concerns.    L. Noelle Penner, RN, BSN, CCM [facility] Care Management Community Coordinator Office number [phone]

## 2022-11-19 NOTE — Telephone Encounter (Signed)
Pts last OV was11/13/23 with Dr. Allyson Sabal.      Pre-operative Risk Assessment    Patient Name: [name] [name]  DOB: 08-12-1965 MRN: [id number]      Request for Surgical Clearance    Procedure:   Left Total Hip Replacement  Date of Surgery:  Clearance TBD                                 Surgeon:  Weber Cooks, MD Surgeon's Group or Practice Name:  [facility] Phone number:  [phone] ext [phone] [facility] Fax number:  [phone]   Type of Clearance Requested:   - Medical    Type of Anesthesia:  Spinal   Additional requests/questions:    SignedZada Finders   11/19/2022, 9:37 AM

## 2022-11-19 NOTE — Patient Outreach (Signed)
Care Coordination   Follow Up Visit Note   11/19/2022 Name: [name] [name] MRN: [id number] DOB: [birth year]  [name] [name] is a 57 y.o. year old female who sees Pickard, Priscille Heidelberg, MD for primary care. I spoke with  [name] [name] by phone today.  What matters to the patients health and wellness today?  CPAP- less tired has not seen neurology to make   Left total hip replacement congestive Heart Failure (CHF) wt 229 lbs    Goals Addressed             This Visit's Progress    [facility] care coordination services   Not on track    Interventions Today    Flowsheet Row Most Recent Value  Chronic Disease   Chronic disease during today's visit Congestive Heart Failure (CHF), Other  [pending clearances for left hip surgery, need to make appointments (Cardiology, Neurology) No CPAP weight management]  General Interventions   General Interventions Discussed/Reviewed General Interventions Reviewed, Durable Medical Equipment (DME), Sick Day Rules, Doctor Visits  [CPAP, review of CHF action plan for weight (fluid) gains, Encouraged and offered assist for scheduling appointments - Does not prefer assist]  Doctor Visits Discussed/Reviewed Doctor Visits Reviewed, PCP, Specialist  [name] [name] 09/2022 pcp visit, pending cardiology, neurology visits]  Durable Medical Equipment (DME) [name], Other  [scales]  PCP/Specialist Visits Compliance with follow-up visit  Exercise Interventions   Exercise Discussed/Reviewed Exercise Discussed, Weight Managment  Weight Management Weight loss  [Discussed weight loss/management  Confirmed recent weight at 229, was 267 lbs loss of 38 lbs Commended her She reports most of it was "fluid"]  Education Interventions   Education Provided Provided Web-based Education, Provided Education  [weight loss, conservative measures prior to surgery, diet, importance of advance directives, [facility] post hospital transition of care follow up outreaches]  Provided Verbal Education On  Nutrition, Mental Health/Coping with Illness, Medication, Sick Day Rules, [employer], When to see the doctor  Mental Health Interventions   Mental Health Discussed/Reviewed Mental Health Reviewed, Coping Strategies  Nutrition Interventions   Nutrition Discussed/Reviewed Nutrition Discussed, Fluid intake, Decreasing salt  Pharmacy Interventions   Pharmacy Dicussed/Reviewed Pharmacy Topics Discussed, Affording Medications  Advanced Directive Interventions   Advanced Directives Discussed/Reviewed Advanced Directives Discussed  [no preference for resources, Confirmed she has made family aware of her end of life wishes]             Patient Active Problem List   Diagnosis Date Noted   Low back pain, unspecified 11/19/2022   Acute kidney injury superimposed on CKD ([name]) 07/20/2022   Pain of left sacroiliac joint 07/19/2022   Acute cystitis with hematuria 07/19/2022   Hypotension 07/19/2022   Chronic kidney disease, stage III (moderate) ([name]) 07/19/2022   Anemia of chronic disease 07/19/2022   Acute encephalopathy 07/06/2022   Chronic diastolic CHF (congestive heart failure) ([name]) 07/06/2022   Bilateral leg edema 06/27/2022   Right foot infection 05/27/2022   Morbid obesity ([name]) 05/26/2022   Cellulitis of right foot 05/22/2022   Sepsis secondary to UTI ([name]) 05/22/2022   Abscess of right foot 05/22/2022   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   Diarrhea in adult patient    Weakness 06/30/2021   Gastroenteritis, infectious 06/30/2021   Hyponatremia 06/30/2021   Open wound of right foot, initial encounter 10/21/2020   Finger ulcer ([name]) 10/21/2020   Non-healing open wound of toe 05/15/2019   Osteomyelitis ([name]) 05/15/2019   Palpitations 10/26/2018   Foot ulcer ([name])  01/31/2018   Hyperglycemia 01/31/2018   Left leg cellulitis 09/24/2017   Near syncope 11/23/2016   Anxiety 09/23/2016   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[name]) 09/23/2016    Constipation 09/23/2016   DDD (degenerative disc disease), lumbosacral 09/23/2016   Migraines 09/23/2016   Myelopathy ([name]) 09/23/2016   Post laminectomy syndrome 09/23/2016   Pseudoarthrosis of lumbar spine 09/23/2016   Sleep apnea 09/23/2016   Spinal stenosis of lumbar region 09/23/2016   Vertigo 09/23/2016   Hallux malleus 11/15/2015   Chronic pain syndrome    GERD (gastroesophageal reflux disease)    Depression    Rash 11/14/2015   Toe ulcer, right ([name]) 04/11/2015   Cellulitis of toe of right foot 04/11/2015   Nonspecific chest pain    Obesity (BMI 30-39.9) 10/26/2014   Hypokalemia 10/26/2014   Manic bipolar I disorder ([name]) 10/26/2014   Tardive dyskinesia 08/26/2011   Manic bipolar I disorder with rapid cycling ([name]) 05/18/2011   Asthma 02/24/2011   History of migraine headaches 02/24/2011    SDOH assessments and interventions completed:  Yes  SDOH Interventions Today    Flowsheet Row Most Recent Value  SDOH Interventions   Food Insecurity Interventions Intervention Not Indicated  Housing Interventions Intervention Not Indicated  Transportation Interventions Intervention Not Indicated  Utilities Interventions Intervention Not Indicated  Stress Interventions Intervention Not Indicated  Health Literacy Interventions Intervention Not Indicated        Care Coordination Interventions:  Yes, provided   Follow up plan: Follow up call scheduled for 12/21/22    Encounter Outcome:  Pt. Visit Completed    L. Noelle Penner, RN, BSN, CCM [facility] Care Management Community Coordinator Office number [phone]

## 2022-11-22 ENCOUNTER — Telehealth: Payer: Self-pay | Admitting: *Deleted

## 2022-11-22 NOTE — Telephone Encounter (Signed)
Pt has been scheduled for a tele visit, 12/03/22 2:20.  Consent on file / medications reconciled.    Patient Consent for Virtual Visit        [name] [name] has provided verbal consent on 11/22/2022 for a virtual visit (video or telephone).   CONSENT FOR VIRTUAL VISIT FOR:  [name] [name]  By participating in this virtual visit I agree to the following:  I hereby voluntarily request, consent and authorize [facility] and its employed or contracted physicians, physician assistants, nurse practitioners or other licensed health care professionals (the Practitioner), to provide me with telemedicine health care services (the "Services") as deemed necessary by the treating Practitioner. I acknowledge and consent to receive the Services by the Practitioner via telemedicine. I understand that the telemedicine visit will involve communicating with the Practitioner through live audiovisual communication technology and the disclosure of certain medical information by electronic transmission. I acknowledge that I have been given the opportunity to request an in-person assessment or other available alternative prior to the telemedicine visit and am voluntarily participating in the telemedicine visit.  I understand that I have the right to withhold or withdraw my consent to the use of telemedicine in the course of my care at any time, without affecting my right to future care or treatment, and that the Practitioner or I may terminate the telemedicine visit at any time. I understand that I have the right to inspect all information obtained and/or recorded in the course of the telemedicine visit and may receive copies of available information for a reasonable fee.  I understand that some of the potential risks of receiving the Services via telemedicine include:  Delay or interruption in medical evaluation due to technological equipment failure or disruption; Information transmitted may not be sufficient  (e.g. poor resolution of images) to allow for appropriate medical decision making by the Practitioner; and/or  In rare instances, security protocols could fail, causing a breach of personal health information.  Furthermore, I acknowledge that it is my responsibility to provide information about my medical history, conditions and care that is complete and accurate to the best of my ability. I acknowledge that Practitioner's advice, recommendations, and/or decision may be based on factors not within their control, such as incomplete or inaccurate data provided by me or distortions of diagnostic images or specimens that may result from electronic transmissions. I understand that the practice of medicine is not an exact science and that Practitioner makes no warranties or guarantees regarding treatment outcomes. I acknowledge that a copy of this consent can be made available to me via my patient portal [facility] MyChart), or I can request a printed copy by calling the office of [facility].    I understand that my insurance will be billed for this visit.   I have read or had this consent read to me. I understand the contents of this consent, which adequately explains the benefits and risks of the Services being provided via telemedicine.  I have been provided ample opportunity to ask questions regarding this consent and the Services and have had my questions answered to my satisfaction. I give my informed consent for the services to be provided through the use of telemedicine in my medical care

## 2022-11-22 NOTE — Telephone Encounter (Signed)
   Name: BLAYKE DAM  DOB: [birth year]  MRN: [id number]  Primary Cardiologist: Nanetta Batty, MD   Preoperative team, please contact this patient and set up a phone call appointment for further preoperative risk assessment. Please obtain consent and complete medication review. Thank you for your help.  I confirm that guidance regarding antiplatelet and oral anticoagulation therapy has been completed and, if necessary, noted below.  None requested.    Carlos Levering, NP 11/22/2022, 11:51 AM [facility]

## 2022-11-22 NOTE — Telephone Encounter (Signed)
Pt has been scheduled for a tele visit, 12/03/22 2:20.  Consent on file / medications reconciled.

## 2022-11-22 NOTE — Telephone Encounter (Signed)
1st attempt to reach pt, left a message to call back and ask for the preop team.

## 2022-11-23 ENCOUNTER — Telehealth: Payer: Self-pay

## 2022-11-23 NOTE — Telephone Encounter (Signed)
Message from Dr. Tanya Nones:  Tanya Nones, Priscille Heidelberg, MD  Darral Dash, LPN She needs to get cbc cmp for surgical clearance, they want hgb > 11.  Was 10.7 in June.

## 2022-11-27 DIAGNOSIS — M869 Osteomyelitis, unspecified: Secondary | ICD-10-CM | POA: Diagnosis not present

## 2022-11-27 DIAGNOSIS — M545 Low back pain, unspecified: Secondary | ICD-10-CM | POA: Diagnosis not present

## 2022-11-27 DIAGNOSIS — G894 Chronic pain syndrome: Secondary | ICD-10-CM | POA: Diagnosis not present

## 2022-11-27 DIAGNOSIS — R531 Weakness: Secondary | ICD-10-CM | POA: Diagnosis not present

## 2022-11-27 DIAGNOSIS — G4733 Obstructive sleep apnea (adult) (pediatric): Secondary | ICD-10-CM | POA: Diagnosis not present

## 2022-11-28 ENCOUNTER — Other Ambulatory Visit: Payer: Self-pay | Admitting: Family Medicine

## 2022-11-28 DIAGNOSIS — D649 Anemia, unspecified: Secondary | ICD-10-CM

## 2022-11-28 DIAGNOSIS — R0602 Shortness of breath: Secondary | ICD-10-CM

## 2022-11-30 NOTE — Telephone Encounter (Signed)
Requested Prescriptions  Pending Prescriptions Disp Refills   ferrous sulfate (FEROSUL) 325 (65 FE) MG tablet [Pharmacy Med Name: FeroSul 325 mg (65 mg iron) tablet] 180 tablet 0    Sig: TAKE ONE TABLET BY MOUTH AT BREAKFAST AND AT BEDTIME     Endocrinology:  Minerals - Iron Supplementation Failed - 11/28/2022  8:00 AM      Failed - HGB in normal range and within 360 days    Hemoglobin  Date Value Ref Range Status  10/08/2022 10.7 (L) 11.7 - 15.5 g/dL Final         Failed - HCT in normal range and within 360 days    HCT  Date Value Ref Range Status  10/08/2022 32.0 (L) 35.0 - 45.0 % Final         Failed - RBC in normal range and within 360 days    RBC  Date Value Ref Range Status  10/08/2022 3.50 (L) 3.80 - 5.10 Million/uL Final         Failed - Valid encounter within last 12 months    Recent Outpatient Visits           1 year ago Benign essential HTN   [facility] Pickard, Priscille Heidelberg, MD   1 year ago Infectious diarrhea   [facility] Donita Brooks, MD   1 year ago Blood in stool, frank   [facility] Tanya Nones, Priscille Heidelberg, MD   2 years ago 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)   [facility] Donita Brooks, MD   2 years ago Upper respiratory tract infection, unspecified type   [facility] Valentino Nose, NP              Passed - Fe (serum) in normal range and within 360 days    Iron  Date Value Ref Range Status  10/08/2022 64 45 - 160 mcg/dL Final   Saturation Ratios  Date Value Ref Range Status  07/21/2022 45 (H) 10.4 - 31.8 % Final         Passed - Ferritin in normal range and within 360 days    Ferritin  Date Value Ref Range Status  10/08/2022 77 16 - 232 ng/mL Final

## 2022-12-03 ENCOUNTER — Other Ambulatory Visit: Payer: Self-pay | Admitting: Family Medicine

## 2022-12-03 ENCOUNTER — Ambulatory Visit: Payer: 59 | Attending: Cardiology

## 2022-12-03 ENCOUNTER — Other Ambulatory Visit (INDEPENDENT_AMBULATORY_CARE_PROVIDER_SITE_OTHER): Payer: Self-pay | Admitting: Family Medicine

## 2022-12-03 DIAGNOSIS — Z01810 Encounter for preprocedural cardiovascular examination: Secondary | ICD-10-CM

## 2022-12-03 NOTE — Progress Notes (Signed)
Virtual Visit via Telephone Note   Because of Jhayda Obara Hurn's co-morbid illnesses, she is at least at moderate risk for complications without adequate follow up.  This format is felt to be most appropriate for this patient at this time.  The patient did not have access to video technology/had technical difficulties with video requiring transitioning to audio format only (telephone).  All issues noted in this document were discussed and addressed.  No physical exam could be performed with this format.  Please refer to the patient's chart for her consent to telehealth for [facility].  Evaluation Performed:  Preoperative cardiovascular risk assessment _____________   Date:  12/03/2022   Patient ID:  [name] [name], DOB [birth year], MRN [id number] Patient Location:  Home Provider location:   Office  Primary Care Provider:  Donita Brooks, MD Primary Cardiologist:  Nanetta Batty, MD  Chief Complaint / Patient Profile   57 y.o. y/o female with a h/o near syncope, chronic diastolic CHF, hypotension who is pending left total hip replacement and presents today for telephonic preoperative cardiovascular risk assessment.  History of Present Illness    [name] is a 57 y.o. female who presents via audio/video conferencing for a telehealth visit today.  Pt was last seen in cardiology clinic on 03/03/22 by Dr.Berry.  At that time [name] [name] was doing well .  The patient is now pending procedure as outlined above. Since her last visit, she continues to be stable from a cardiac standpoint.  Today she denies chest pain, shortness of breath, lower extremity edema, fatigue, palpitations, melena, hematuria, hemoptysis, diaphoresis, weakness, presyncope, syncope, orthopnea, and PND.   Past Medical History    Past Medical History:  Diagnosis Date   Acute metabolic encephalopathy 06/08/2022   AMS (altered mental status) 05/22/2022   Anxiety    Arthritis    Phreesia  02/08/2020   Asthma    mild intermittent   Asthma    Phreesia 02/08/2020   Bipolar 1 disorder ([name])    ect treatments last treatment Sep 02 1011   Depression    Depression    Phreesia 02/08/2020   Depression    Phreesia 07/10/2020   GERD (gastroesophageal reflux disease)    Headache    Hypertension    Pre-diabetes    Seizures ([name])    Thinks it was from taking Tramadol   Septic shock ([name]) 05/22/2022   Shortness of breath 09/23/2016   Overview:   exertional   Sleep apnea    AHI 40 (2024)   Substance abuse ([name])    Phreesia 02/08/2020   Syncope 08/26/2019   Syncope and collapse    Past Surgical History:  Procedure Laterality Date   ABDOMINAL HYSTERECTOMY     AMPUTATION TOE Left 02/02/2018   Procedure: AMPUTATION TOE Left 4th toe;  Surgeon: Vivi Barrack, DPM;  Location: [facility] OR;  Service: Podiatry;  Laterality: Left;   APPENDECTOMY N/A    Phreesia 02/08/2020   BACK SURGERY  2018   ACDF   C5-6 & C6-7 by Dr. Sharolyn Douglas   CARPAL TUNNEL RELEASE     x2   I & D EXTREMITY Right 05/22/2022   Procedure: IRRIGATION AND DEBRIDEMENT EXTREMITY;  Surgeon: Candelaria Stagers, DPM;  Location: [facility] ORS;  Service: Podiatry;  Laterality: Right;   INCISION AND DRAINAGE Right 05/24/2022   Procedure: RIGHT REVISION INCISION AND DRAINAGE/ WASHOUT WITH PRIMARY DELAYED CLOSURE;  Surgeon: Edwin Cap, DPM;  Location: [facility] ORS;  Service: Podiatry;  Laterality: Right;   IRRIGATION AND DEBRIDEMENT ABSCESS Right 10/25/2020   Procedure: IRRIGATION AND DEBRIDEMENT ABSCESS OF FOOT AND APPLICATION OF GRAFT;  Surgeon: Vivi Barrack, DPM;  Location: [facility] OR;  Service: Podiatry;  Laterality: Right;   Laproscopic knee surgery Right    NECK SURGERY  03/15/2017   ACDF   by Dr. Noel Gerold   PLANTAR FASCIA RELEASE     x2   REVERSE SHOULDER ARTHROPLASTY Right 04/14/2022   Procedure: REVERSE SHOULDER ARTHROPLASTY;  Surgeon: Bjorn Pippin, MD;  Location: [facility];  Service: Orthopedics;  Laterality: Right;    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   Procedure: TRANSMETATARSAL AMPUTATION;  Surgeon: Vivi Barrack, DPM;  Location: [facility];  Service: Podiatry;  Laterality: Right;   TRANSMETATARSAL AMPUTATION Right 05/27/2022   Procedure: REVISION TRANSMETATARSAL AMPUTATION WITH DELAYED PRIMARY CLOSURE;  Surgeon: Felecia Shelling, DPM;  Location: [facility] OR[facility];  Service: Podiatry;  Laterality: Right;    Allergies  Allergies  Allergen Reactions   Tetracyclines & Related Other (See Comments)    Syncope and put her "in a coma"   Tramadol Other (See Comments)    Seizures   Oxybutynin Other (See Comments)    Unknown reaction per Mom   Phenazopyridine Other (See Comments)    Unknown reaction   Ciprofloxacin Rash and Itching   Codeine Itching and Rash   Estradiol Rash    Patches broke out the skin    Home Medications    Prior to Admission medications   Medication Sig Start Date End Date Taking? Authorizing Provider  albuterol (PROAIR HFA) 108 (90 Base) MCG/ACT inhaler Inhale 2 puffs = into the lungs every 6 (six) hours as needed for wheezing or shortness of breath. Patient taking differently: Inhale 2 puffs into the lungs every 6 (six) hours as needed for wheezing or shortness of breath. 07/11/20   Valentino Nose, NP  busPIRone (BUSPAR) 15 MG tablet Take 15 mg by mouth 3 (three) times daily.  04/26/17   [provider]  Calcium Carbonate-Vit D-Min (CALCIUM 1200 PO) Take 1,200 mg by mouth daily.     [provider]  DULoxetine (CYMBALTA) 20 MG capsule Take 1 capsule (20 mg total) by mouth daily. 10/08/22 11/07/22  Donita Brooks, MD  DULoxetine (CYMBALTA) 60 MG capsule TAKE ONE CAPSULE BY MOUTH ONCE DAILY 08/11/22   Donita Brooks, MD  Erenumab-aooe (AIMOVIG) 70 MG/ML SOAJ administer 1mL UNDER THE SKIN every 30 DAYS 10/08/22   Donita Brooks, MD  escitalopram (LEXAPRO) 10 MG tablet Take 10 mg by mouth daily. 08/23/19   [provider]  estradiol (ESTRACE) 1 MG  tablet TAKE ONE TABLET BY MOUTH EVERYDAY AT BEDTIME 07/19/22   Donita Brooks, MD  famotidine (PEPCID) 40 MG tablet TAKE ONE TABLET BY MOUTH EVERYDAY AT BEDTIME 10/29/22   Donita Brooks, MD  ferrous sulfate (FEROSUL) 325 (65 FE) MG tablet TAKE ONE TABLET BY MOUTH AT [facility] AND AT BEDTIME 11/30/22   Donita Brooks, MD  furosemide (LASIX) 40 MG tablet Take 1 tablet (40 mg total) by mouth daily. 06/18/22   Donita Brooks, MD  haloperidol (HALDOL) 5 MG tablet Take 5 mg by mouth at bedtime. 11/11/22   [provider]  lamoTRIgine (LAMICTAL) 100 MG tablet Take 100 mg by mouth daily. 08/06/21   [provider]  levocetirizine (XYZAL) 5 MG tablet TAKE ONE TABLET BY MOUTH EVERY EVENING 10/29/22   Donita Brooks, MD  losartan (  COZAAR) 50 MG tablet Take 1 tablet (50 mg total) by mouth daily. 06/27/22 09/25/22  Pokhrel, Rebekah Chesterfield, MD  methocarbamol (ROBAXIN) 500 MG tablet Take 500 mg by mouth every 8 (eight) hours as needed.    [provider]  Multiple Vitamins-Minerals (MULTIVITAMIN WITH MINERALS) tablet Take 1 tablet by mouth daily.    [provider]  naloxone [facility]) nasal spray 4 mg/0.1 mL Place 0.4 mg into the nose once. 08/03/21   [provider]  oxyCODONE-acetaminophen (PERCOCET) 10-325 MG tablet Take one tablet by mouth up to 4 times daily as needed.    [provider]  pantoprazole (PROTONIX) 40 MG tablet Take 1 tablet (40 mg total) by mouth 2 (two) times daily. 02/02/22   Donita Brooks, MD  polyethylene glycol (MIRALAX / GLYCOLAX) 17 g packet Take 17 g by mouth daily. 07/13/22   Kathlen Mody, MD  prazosin (MINIPRESS) 2 MG capsule Take 2 mg by mouth at bedtime.    [provider]  pregabalin (LYRICA) 100 MG capsule Take 1 capsule (100 mg total) by mouth 2 (two) times daily. Patient taking differently: Take 100 mg by mouth 3 (three) times daily. 07/27/21   Donita Brooks, MD  solifenacin (VESICARE) 10 MG tablet TAKE ONE TABLET  BY MOUTH ONCE DAILY 10/29/22   Donita Brooks, MD  SUMAtriptan (IMITREX) 50 MG tablet Take 50 mg by mouth every 2 (two) hours as needed for migraine. 08/30/19   [provider]  topiramate (TOPAMAX) 50 MG tablet TAKE ONE TABLET BY MOUTH TWICE DAILY 10/29/22   Donita Brooks, MD  traZODone (DESYREL) 100 MG tablet Take 1 tablet (100 mg total) by mouth at bedtime. Patient taking differently: Take 200 mg by mouth at bedtime. 07/12/22   Kathlen Mody, MD    Physical Exam    Vital Signs:  KRISSI LUSKY does not have vital signs available for review today.  Given telephonic nature of communication, physical exam is limited. AAOx3. NAD. Normal affect.  Speech and respirations are unlabored.  Accessory Clinical Findings    None  Assessment & Plan    1.  Preoperative Cardiovascular Risk Assessment: Left total hip replacement, [facility] Harness, Dr. Blanchie Dessert fax [phone]     Primary Cardiologist: Nanetta Batty, MD  Chart reviewed as part of pre-operative protocol coverage. Given past medical history and time since last visit, based on ACC/AHA guidelines, BATUL MENTING would be at acceptable risk for the planned procedure without further cardiovascular testing.   Her RCRI is a class I risk, 0.4% risk of major cardiac event.  He is able to complete greater than 4 METS of physical activity.  Patient was advised that if she develops new symptoms prior to surgery to contact our office to arrange a follow-up appointment.  He verbalized understanding.  I will route this recommendation to the requesting party via Epic fax function and remove from pre-op pool.       Time:   Today, I have spent 5 minutes with the patient with telehealth technology discussing medical history, symptoms, and management plan.  Prior to her phone evaluation I spent greater than 10 minutes reviewing her past medical history and cardiac medications.   Ronney Asters, NP  12/03/2022, 6:57 AM

## 2022-12-06 NOTE — Telephone Encounter (Signed)
30 day courtesy refill given  Requested Prescriptions  Pending Prescriptions Disp Refills   DULoxetine (CYMBALTA) 60 MG capsule [Pharmacy Med Name: duloxetine 60 mg capsule,delayed release] 30 capsule 0    Sig: TAKE ONE CAPSULE BY MOUTH ONCE DAILY     Psychiatry: Antidepressants - SNRI - duloxetine Failed - 12/03/2022  1:41 PM      Failed - Valid encounter within last 6 months    Recent Outpatient Visits           1 year ago Benign essential HTN   [facility] Donita Brooks, MD   1 year ago Infectious diarrhea   [facility] Donita Brooks, MD   1 year ago Blood in stool, frank   [facility] Pickard, Priscille Heidelberg, MD   2 years ago 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)   [facility] Donita Brooks, MD   2 years ago Upper respiratory tract infection, unspecified type   [facility] Valentino Nose, NP       Future Appointments             In 4 days Donita Brooks, MD [facility]            Passed - Cr in normal range and within 360 days    Creat  Date Value Ref Range Status  10/08/2022 1.00 0.50 - 1.03 mg/dL Final   Creatinine, Urine  Date Value Ref Range Status  05/31/2022 203 mg/dL Final    Comment:    Performed at [facility], [address] [place], [place] [address]         Passed - eGFR is 30 or above and within 360 days    GFR, Est African American  Date Value Ref Range Status  06/27/2020 119 > OR = 60 mL/min/1.34m2 Final   GFR, Est Non African American  Date Value Ref Range Status  06/27/2020 102 > OR = 60 mL/min/1.14m2 Final   GFR, Estimated  Date Value Ref Range Status  07/22/2022 49 (L) >60 mL/min Final    Comment:    (NOTE) Calculated using the CKD-EPI Creatinine Equation (2021)    eGFR  Date Value Ref Range Status  10/08/2022 65 > OR = 60 mL/min/1.78m2 Final         Passed - Completed  PHQ-2 or PHQ-9 in the last 360 days      Passed - Last BP in normal range    BP Readings from Last 1 Encounters:  10/08/22 112/70

## 2022-12-09 DIAGNOSIS — K5903 Drug induced constipation: Secondary | ICD-10-CM | POA: Diagnosis not present

## 2022-12-09 DIAGNOSIS — M25559 Pain in unspecified hip: Secondary | ICD-10-CM | POA: Diagnosis not present

## 2022-12-09 DIAGNOSIS — Z79891 Long term (current) use of opiate analgesic: Secondary | ICD-10-CM | POA: Diagnosis not present

## 2022-12-09 DIAGNOSIS — M25519 Pain in unspecified shoulder: Secondary | ICD-10-CM | POA: Diagnosis not present

## 2022-12-09 DIAGNOSIS — G8929 Other chronic pain: Secondary | ICD-10-CM | POA: Diagnosis not present

## 2022-12-10 ENCOUNTER — Ambulatory Visit (INDEPENDENT_AMBULATORY_CARE_PROVIDER_SITE_OTHER): Payer: 59 | Admitting: Family Medicine

## 2022-12-10 VITALS — BP 128/86 | HR 110 | Temp 97.6°F | Ht 64.0 in | Wt 237.0 lb

## 2022-12-10 DIAGNOSIS — D649 Anemia, unspecified: Secondary | ICD-10-CM | POA: Diagnosis not present

## 2022-12-10 NOTE — Progress Notes (Signed)
Subjective:    Patient ID: [name] [name], female    DOB: [birth year], 57 y.o.   MRN: [id number]   Admit date: 07/18/2022 Discharge date: 07/23/2022  Brief/Interim Summary: 57 year old female with anxiety, asthma, bipolar, depression, GERD, hypertension, prediabetes presents to the hospital with not feeling well.  She has some ear pain, burning on urination, left back pain and side pain, nausea, poor appetite and fever.  Admitted with sepsis.   4/1.  Patient still not feeling well.  Started on Rocephin for sepsis likely urinary source.  Solu-Medrol started for left sacroiliac pain. 4/2.  Blood cultures negative for 2 days.  Urine culture only growing out 20,000 E. coli.  Continue Rocephin.  Continue Solu-Medrol. 4/3: Blood cultures remain negative.  Pansensitive E. coli noted on urine culture. 4/4: Slowly improving. 4/5: Completed treatment for cystitis.  Continues to endorse left hip pain.  Robaxin added.  Stable for dc       Discharge Diagnoses:  Principal Problem:   Sepsis secondary to UTI Active Problems:   Acute cystitis with hematuria   Pain of left sacroiliac joint   Hypotension   Chronic diastolic CHF (congestive heart failure)   Acute kidney injury superimposed on CKD   Chronic kidney disease, stage III (moderate)   Anemia of chronic disease   GERD (gastroesophageal reflux disease)   Sleep apnea   Hypokalemia   Hyponatremia   Sepsis secondary to UTI Present on admission, likely urinary source with positive urine analysis.  Blood cultures negative for 3 days.  Urine culture only growing out 20,000 E. coli.  Pansensitive.  Completed IV antibiotics.  No further antibiotics at this time.  Stable for dc   Acute cystitis with hematuria Completed antibiotic course   Pain of left sacroiliac joint Multimodal pain regimen Therapy evaluations Minimize use of narcotics and sedating medications DC home with home health 4/5   Hypotension Improved.  Home antihypertensive  regimen improved   Acute kidney injury superimposed on CKD Acute kidney injury on CKD stage IIIa.  Improved.  Creatinine improving   Chronic diastolic CHF (congestive heart failure) Resume home Lasix 40 mg daily     Anemia of chronic disease Suspect anemia of chronic disease in the setting of multiple chronic illnesses.  Hemoglobin relatively stable in the low.  No indication for transfusion currently.   GERD (gastroesophageal reflux disease) Continue PPI   Sleep apnea CPAP   Hyponatremia Sodium now in normal range.   Hypokalemia Replaced   10/08/22 Patient is here today for follow-up.  She was last admitted in April for UTI.  She has been dealing with anemia ever since February.  In December of last year her hemoglobin was 12.7.  It was 12 in February however it precipitously dropped at that point and has been between 7 and 9 ever since.  Her hemoglobin at discharge was 7.4.  She reports feeling tired and weak and sleepy.  I believe a lot of the sleepiness is due to polypharmacy secondary to multiple psychiatric medications that is managing her psychiatrist.  Also confirmed the saw fatigue likely due to the anemia.  She denies any chest pain or shortness of breath or dyspnea on exertion.  She does report polydipsia and feeling thirsty all the time.  However she is also on Lasix due to her congestive heart failure with preserved ejection fraction.  At that time, my plan was: I explained to the patient and her son my concern regarding her anemia.  She has been taking  iron since discharge from the hospital.  I will check a CBC today.  I will also check an iron level as well as a B12 level.  If she remains anemic and her iron level is low, I would recommend hematology consultation to discuss IV iron replacement due to inadequate absorption.  I will also check her stool for blood.  If there is blood in her stool, we will need to consult GI for endoscopy and colonoscopy.  12/10/22 Stool sample  10/19/22 showed blood.  Recommended GI consult.  Patient had had a positive stool sample for blood also in March.  She has not followed up with GI.  They reached out and contacted her however she did not schedule the appointment.  We have made her the contact information today and explained both the patient and her son the importance of scheduling a follow-up appoint with GI.  She presents today because she wants clearance to have her hip replacement surgery.  However orthopedics requires a hemoglobin greater than 11 to proceed with surgery.  Her last hemoglobin was 10.7 in June Past Medical History:  Diagnosis Date   Acute metabolic encephalopathy 06/08/2022   AMS (altered mental status) 05/22/2022   Anxiety    Arthritis    Phreesia 02/08/2020   Asthma    mild intermittent   Asthma    Phreesia 02/08/2020   Bipolar 1 disorder ([name])    ect treatments last treatment Sep 02 1011   Depression    Depression    Phreesia 02/08/2020   Depression    Phreesia 07/10/2020   GERD (gastroesophageal reflux disease)    Headache    Hypertension    Pre-diabetes    Seizures ([name])    Thinks it was from taking Tramadol   Septic shock ([name]) 05/22/2022   Shortness of breath 09/23/2016   Overview:   exertional   Sleep apnea    AHI 40 (2024)   Substance abuse ([name])    Phreesia 02/08/2020   Syncope 08/26/2019   Syncope and collapse    Past Surgical History:  Procedure Laterality Date   ABDOMINAL HYSTERECTOMY     AMPUTATION TOE Left 02/02/2018   Procedure: AMPUTATION TOE Left 4th toe;  Surgeon: Vivi Barrack, DPM;  Location: [facility] OR;  Service: Podiatry;  Laterality: Left;   APPENDECTOMY N/A    Phreesia 02/08/2020   BACK SURGERY  2018   ACDF   C5-6 & C6-7 by Dr. Sharolyn Douglas   CARPAL TUNNEL RELEASE     x2   I & D EXTREMITY Right 05/22/2022   Procedure: IRRIGATION AND DEBRIDEMENT EXTREMITY;  Surgeon: Candelaria Stagers, DPM;  Location: [facility] OR[facility];  Service: Podiatry;  Laterality: Right;   INCISION AND  DRAINAGE Right 05/24/2022   Procedure: RIGHT REVISION INCISION AND DRAINAGE/ WASHOUT WITH PRIMARY DELAYED CLOSURE;  Surgeon: Edwin Cap, DPM;  Location: [facility];  Service: Podiatry;  Laterality: Right;   IRRIGATION AND DEBRIDEMENT ABSCESS Right 10/25/2020   Procedure: IRRIGATION AND DEBRIDEMENT ABSCESS OF FOOT AND APPLICATION OF GRAFT;  Surgeon: Vivi Barrack, DPM;  Location: [facility] OR;  Service: Podiatry;  Laterality: Right;   Laproscopic knee surgery Right    NECK SURGERY  03/15/2017   ACDF   by Dr. Noel Gerold   PLANTAR FASCIA RELEASE     x2   REVERSE SHOULDER ARTHROPLASTY Right 04/14/2022   Procedure: REVERSE SHOULDER ARTHROPLASTY;  Surgeon: Bjorn Pippin, MD;  Location: [facility];  Service: Orthopedics;  Laterality: Right;   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   Procedure: TRANSMETATARSAL AMPUTATION;  Surgeon: Vivi Barrack, DPM;  Location: [facility];  Service: Podiatry;  Laterality: Right;   TRANSMETATARSAL AMPUTATION Right 05/27/2022   Procedure: REVISION TRANSMETATARSAL AMPUTATION WITH DELAYED PRIMARY CLOSURE;  Surgeon: Felecia Shelling, DPM;  Location: [facility];  Service: Podiatry;  Laterality: Right;   Current Outpatient Medications on File Prior to Visit  Medication Sig Dispense Refill   albuterol (PROAIR HFA) 108 (90 Base) MCG/ACT inhaler Inhale 2 puffs = into the lungs every 6 (six) hours as needed for wheezing or shortness of breath. (Patient taking differently: Inhale 2 puffs into the lungs every 6 (six) hours as needed for wheezing or shortness of breath.) 8.5 g 3   busPIRone (BUSPAR) 15 MG tablet Take 15 mg by mouth 3 (three) times daily.   2   Calcium Carbonate-Vit D-Min (CALCIUM 1200 PO) Take 1,200 mg by mouth daily.      DULoxetine (CYMBALTA) 60 MG capsule TAKE ONE CAPSULE BY MOUTH ONCE DAILY 30 capsule 0   Erenumab-aooe (AIMOVIG) 70 MG/ML SOAJ administer 1mL UNDER THE SKIN every 30 DAYS 1 mL 5   escitalopram (LEXAPRO) 10 MG tablet Take 10 mg by mouth daily.      estradiol (ESTRACE) 1 MG tablet TAKE ONE TABLET BY MOUTH EVERYDAY AT BEDTIME 90 tablet 3   famotidine (PEPCID) 40 MG tablet TAKE ONE TABLET BY MOUTH EVERYDAY AT BEDTIME 90 tablet 0   ferrous sulfate (FEROSUL) 325 (65 FE) MG tablet TAKE ONE TABLET BY MOUTH AT BREAKFAST AND AT BEDTIME 180 tablet 0   furosemide (LASIX) 40 MG tablet Take 1 tablet (40 mg total) by mouth daily. 30 tablet 3   haloperidol (HALDOL) 5 MG tablet Take 5 mg by mouth at bedtime.     lamoTRIgine (LAMICTAL) 100 MG tablet Take 100 mg by mouth daily.     levocetirizine (XYZAL) 5 MG tablet TAKE ONE TABLET BY MOUTH EVERY EVENING 90 tablet 0   methocarbamol (ROBAXIN) 500 MG tablet Take 500 mg by mouth every 8 (eight) hours as needed.     metoprolol succinate (TOPROL-XL) 25 MG 24 hr tablet Take 25 mg by mouth daily.     Multiple Vitamins-Minerals (MULTIVITAMIN WITH MINERALS) tablet Take 1 tablet by mouth daily.     naloxone (NARCAN) nasal spray 4 mg/0.1 mL Place 0.4 mg into the nose once.     oxyCODONE-acetaminophen (PERCOCET) 10-325 MG tablet Take one tablet by mouth up to 4 times daily as needed.     pantoprazole (PROTONIX) 40 MG tablet Take 1 tablet (40 mg total) by mouth 2 (two) times daily. 180 tablet 3   polyethylene glycol (MIRALAX / GLYCOLAX) 17 g packet Take 17 g by mouth daily. 14 each 0   prazosin (MINIPRESS) 2 MG capsule Take 2 mg by mouth at bedtime.     pregabalin (LYRICA) 100 MG capsule Take 1 capsule (100 mg total) by mouth 2 (two) times daily. (Patient taking differently: Take 100 mg by mouth 3 (three) times daily.) 90 capsule 3   solifenacin (VESICARE) 10 MG tablet TAKE ONE TABLET BY MOUTH ONCE DAILY 90 tablet 0   SUMAtriptan (IMITREX) 50 MG tablet Take 50 mg by mouth every 2 (two) hours as needed for migraine.     topiramate (TOPAMAX) 50 MG tablet TAKE ONE TABLET BY MOUTH TWICE DAILY 180 tablet 0   traZODone (DESYREL) 100 MG tablet Take 1 tablet (100 mg total) by mouth at bedtime. (Patient taking differently: Take  200 mg by mouth at bedtime.) 30 tablet 0   losartan (COZAAR) 50 MG tablet Take 1 tablet (50 mg total) by mouth daily. 30 tablet 2   No current facility-administered medications on file prior to visit.   Allergies  Allergen Reactions   Tetracyclines & Related Other (See Comments)    Syncope and put her "in a coma"   Tramadol Other (See Comments)    Seizures   Oxybutynin Other (See Comments)    Unknown reaction per Mom   Phenazopyridine Other (See Comments)    Unknown reaction   Ciprofloxacin Rash and Itching   Codeine Itching and Rash   Estradiol Rash    Patches broke out the skin   Social History   Socioeconomic History   Marital status: Widowed    Spouse name: Not on file   Number of children: 3   Years of education: 27   Highest education level: Not on file  Occupational History   Occupation: Disabled  Tobacco Use   Smoking status: Never   Smokeless tobacco: Never  Vaping Use   Vaping status: Never Used  Substance and Sexual Activity   Alcohol use: No   Drug use: No   Sexual activity: Not Currently  Other Topics Concern   Not on file  Social History Narrative   Patient drink 4 16oz caffeine drinks a day    Social Determinants of Health   Financial Resource Strain: Low Risk  (07/27/2022)   Overall Financial Resource Strain (CARDIA)    Difficulty of Paying Living Expenses: Not hard at all  Food Insecurity: No Food Insecurity (11/19/2022)   Hunger Vital Sign    Worried About Running Out of Food in the Last Year: Never true    Ran Out of Food in the Last Year: Never true  Transportation Needs: No Transportation Needs (11/19/2022)   PRAPARE - Administrator, Civil Service (Medical): No    Lack of Transportation (Non-Medical): No  Physical Activity: Inactive (07/26/2022)   Exercise Vital Sign    Days of Exercise per Week: 0 days    Minutes of Exercise per Session: 0 min  Stress: No Stress Concern Present (11/19/2022)   [employer] of Occupational  Health - Occupational Stress Questionnaire    Feeling of Stress : Not at all  Social Connections: Unknown (08/30/2021)   Received from [facility]   Social Network    Social Network: Not on file  Intimate Partner Violence: Not At Risk (07/19/2022)   Humiliation, Afraid, Rape, and Kick questionnaire    Fear of Current or Ex-Partner: No    Emotionally Abused: No    Physically Abused: No    Sexually Abused: No   Family History  Problem Relation Age of Onset   Diabetes Mother    COPD Mother    Hypertension Mother    Hyperlipidemia Mother    Sleep apnea Mother    Heart disease Father    Hyperlipidemia Father    Hypertension Father    Cancer Father    Sleep apnea Father    Heart disease Brother    Sleep apnea Brother    Heart disease Maternal Grandmother    Hypertension Maternal Grandmother    Sleep apnea Maternal Grandmother    Heart disease Maternal Grandfather    Kidney cancer Paternal Grandmother    Heart disease Paternal Grandfather    Heart disease Daughter       Review of Systems  All other systems reviewed and are  negative.      Objective:   Physical Exam Vitals reviewed.  Constitutional:      General: She is not in acute distress.    Appearance: She is well-developed. She is not diaphoretic.  HENT:     Head: Normocephalic and atraumatic.     Right Ear: External ear normal.     Left Ear: External ear normal.     Nose: Nose normal.     Mouth/Throat:     Pharynx: No oropharyngeal exudate.  Eyes:     General: No scleral icterus.       Right eye: No discharge.        Left eye: No discharge.     Conjunctiva/sclera: Conjunctivae normal.     Pupils: Pupils are equal, round, and reactive to light.  Neck:     Thyroid: No thyromegaly.     Trachea: No tracheal deviation.  Cardiovascular:     Rate and Rhythm: Normal rate and regular rhythm.     Heart sounds: Normal heart sounds. No murmur heard.    No friction rub. No gallop.  Pulmonary:     Effort:  Pulmonary effort is normal. No respiratory distress.     Breath sounds: No stridor. No decreased breath sounds, wheezing, rhonchi or rales.  Chest:     Chest wall: No tenderness.  Abdominal:     General: Bowel sounds are normal. There is no distension.     Palpations: Abdomen is soft.     Tenderness: There is no abdominal tenderness.  Musculoskeletal:     Cervical back: Normal range of motion and neck supple.     Right lower leg: No edema.     Left lower leg: No edema.  Lymphadenopathy:     Cervical: No cervical adenopathy.  Skin:    General: Skin is warm.     Coloration: Skin is not pale.     Findings: No erythema or rash.  Neurological:     Mental Status: She is alert and oriented to person, place, and time.     Cranial Nerves: No cranial nerve deficit.     Motor: No abnormal muscle tone.     Coordination: Coordination normal.     Deep Tendon Reflexes: Reflexes are normal and symmetric.           Assessment & Plan:  Anemia, unspecified type - Plan: CBC with Differential/Platelet, COMPLETE METABOLIC PANEL WITH GFR, Iron Patient denies any chest pain shortness of breath or dyspnea on exertion.  Her blood pressure today is well-controlled.  I will check a CBC.  If her hemoglobin is greater than 11, I feel she can proceed with her elective hip replacement.  However I asked her also to contact GI to schedule workup for the blood in her stool.  If her hemoglobin is less than 11, the patient will require GI consultation first prior to the surgery.

## 2022-12-11 LAB — COMPLETE METABOLIC PANEL WITH GFR
AG Ratio: 1.3 (calc) (ref 1.0–2.5)
ALT: 6 U/L (ref 6–29)
AST: 8 U/L — ABNORMAL LOW (ref 10–35)
Albumin: 4 g/dL (ref 3.6–5.1)
Alkaline phosphatase (APISO): 93 U/L (ref 37–153)
BUN: 13 mg/dL (ref 7–25)
CO2: 25 mmol/L (ref 20–32)
Calcium: 9.3 mg/dL (ref 8.6–10.4)
Chloride: 104 mmol/L (ref 98–110)
Creat: 0.96 mg/dL (ref 0.50–1.03)
Globulin: 3 g/dL (ref 1.9–3.7)
Glucose, Bld: 137 mg/dL — ABNORMAL HIGH (ref 65–99)
Potassium: 4 mmol/L (ref 3.5–5.3)
Sodium: 139 mmol/L (ref 135–146)
Total Bilirubin: 0.2 mg/dL (ref 0.2–1.2)
Total Protein: 7 g/dL (ref 6.1–8.1)
eGFR: 68 mL/min/{1.73_m2} (ref >=60)

## 2022-12-11 LAB — CBC WITH DIFFERENTIAL/PLATELET
Absolute Monocytes: 376 {cells}/uL (ref 200–950)
Basophils Absolute: 32 {cells}/uL (ref 0–200)
Basophils Relative: 0.6 %
Eosinophils Absolute: 148 {cells}/uL (ref 15–500)
Eosinophils Relative: 2.8 %
HCT: 32.4 % — ABNORMAL LOW (ref 35.0–45.0)
Hemoglobin: 10.7 g/dL — ABNORMAL LOW (ref 11.7–15.5)
Lymphs Abs: 1701 {cells}/uL (ref 850–3900)
MCH: 31.1 pg (ref 27.0–33.0)
MCHC: 33 g/dL (ref 32.0–36.0)
MCV: 94.2 fL (ref 80.0–100.0)
MPV: 9.3 fL (ref 7.5–12.5)
Monocytes Relative: 7.1 %
Neutro Abs: 3042 {cells}/uL (ref 1500–7800)
Neutrophils Relative %: 57.4 %
Platelets: 283 10*3/uL (ref 140–400)
RBC: 3.44 10*6/uL — ABNORMAL LOW (ref 3.80–5.10)
RDW: 13.3 % (ref 11.0–15.0)
Total Lymphocyte: 32.1 %
WBC: 5.3 10*3/uL (ref 3.8–10.8)

## 2022-12-11 LAB — IRON: Iron: 85 ug/dL (ref 45–160)

## 2022-12-14 DIAGNOSIS — M961 Postlaminectomy syndrome, not elsewhere classified: Secondary | ICD-10-CM | POA: Diagnosis not present

## 2022-12-14 DIAGNOSIS — M25561 Pain in right knee: Secondary | ICD-10-CM | POA: Diagnosis not present

## 2022-12-14 DIAGNOSIS — M25511 Pain in right shoulder: Secondary | ICD-10-CM | POA: Diagnosis not present

## 2022-12-14 DIAGNOSIS — M25552 Pain in left hip: Secondary | ICD-10-CM | POA: Diagnosis not present

## 2022-12-17 ENCOUNTER — Ambulatory Visit
Admission: EM | Admit: 2022-12-17 | Discharge: 2022-12-17 | Disposition: A | Discharge status: Home / Self Care | Payer: 59 | Attending: Nurse Practitioner | Admitting: Nurse Practitioner

## 2022-12-17 DIAGNOSIS — T8789 Other complications of amputation stump: Secondary | ICD-10-CM

## 2022-12-17 DIAGNOSIS — L97509 Non-pressure chronic ulcer of other part of unspecified foot with unspecified severity: Secondary | ICD-10-CM

## 2022-12-17 MED ORDER — SULFAMETHOXAZOLE-TRIMETHOPRIM 800-160 MG PO TABS: 1.0000 | ORAL_TABLET | Freq: Two times a day (BID) | ORAL | 0 refills | Status: AC

## 2022-12-17 NOTE — ED Notes (Addendum)
Site dressed with bacitracin, nonadherent pad, gauze wrapping applied, and secured with medical tape. Pt tolerated well.  Site management and infection prevention education provided.pt and pt family verbalized understanding.

## 2022-12-17 NOTE — Discharge Instructions (Signed)
Take medication as prescribed. May take over-the-counter Tylenol or ibuprofen as needed for pain, fever, or general discomfort. Cleanse the area with an antibacterial soap twice daily.  You may use over-the-counter Dial gold bar soap for cleansing. Continue to monitor the area for worsening.  If you notice increased redness, swelling, foul-smelling drainage, or other concerns, please follow-up in the emergency department immediately. Keep scheduled appointment with podiatrist for next week. Follow-up as needed.

## 2022-12-17 NOTE — ED Provider Notes (Signed)
[facility]    CSN: [id number] Arrival date & time: 12/17/22  1547      History   Chief Complaint Chief Complaint  Patient presents with   Toe Injury    HPI [name] [name] is a 57 y.o. female.   The history is provided by the patient.   Patient presents for complaints of an open sore to the right foot stump has been present for the past 24 hours.  Patient states she noticed soreness to the stump approximately 1 week ago.  She states that she has a history of neuropathy, and cannot feel anything in that extremity.  Patient states she does have a history of sepsis and infection in that stump.  She states that she was last hospitalized for sepsis in March of this year.  Patient denies fever, chest pain, abdominal pain, nausea, vomiting, diarrhea, or drainage from the site.  She reports she was able to schedule an appointment with her podiatrist for next week.  Past Medical History:  Diagnosis Date   Acute metabolic encephalopathy 06/08/2022   AMS (altered mental status) 05/22/2022   Anxiety    Arthritis    Phreesia 02/08/2020   Asthma    mild intermittent   Asthma    Phreesia 02/08/2020   Bipolar 1 disorder ([name])    ect treatments last treatment Sep 02 1011   Depression    Depression    Phreesia 02/08/2020   Depression    Phreesia 07/10/2020   GERD (gastroesophageal reflux disease)    Headache    Hypertension    Pre-diabetes    Seizures ([name])    Thinks it was from taking Tramadol   Septic shock ([name]) 05/22/2022   Shortness of breath 09/23/2016   Overview:   exertional   Sleep apnea    AHI 40 (2024)   Substance abuse ([name])    Phreesia 02/08/2020   Syncope 08/26/2019   Syncope and collapse     Patient Active Problem List   Diagnosis Date Noted   Low back pain, unspecified 11/19/2022   Acute kidney injury superimposed on CKD ([name]) 07/20/2022   Pain of left sacroiliac joint 07/19/2022   Acute cystitis with hematuria 07/19/2022   Hypotension  07/19/2022   Chronic kidney disease, stage III (moderate) ([name]) 07/19/2022   Anemia of chronic disease 07/19/2022   Acute encephalopathy 07/06/2022   Chronic diastolic CHF (congestive heart failure) ([name]) 07/06/2022   Bilateral leg edema 06/27/2022   Right foot infection 05/27/2022   Morbid obesity ([name]) 05/26/2022   Cellulitis of right foot 05/22/2022   Sepsis secondary to UTI ([name]) 05/22/2022   Abscess of right foot 05/22/2022   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   Diarrhea in adult patient    Weakness 06/30/2021   Gastroenteritis, infectious 06/30/2021   Hyponatremia 06/30/2021   Open wound of right foot, initial encounter 10/21/2020   Finger ulcer ([name]) 10/21/2020   Non-healing open wound of toe 05/15/2019   Osteomyelitis ([name]) 05/15/2019   Palpitations 10/26/2018   Foot ulcer ([name]) 01/31/2018   Hyperglycemia 01/31/2018   Left leg cellulitis 09/24/2017   Near syncope 11/23/2016   Anxiety 09/23/2016   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[name]) 09/23/2016   Constipation 09/23/2016   DDD (degenerative disc disease), lumbosacral 09/23/2016   Migraines 09/23/2016   Myelopathy ([name]) 09/23/2016   Post laminectomy syndrome 09/23/2016   Pseudoarthrosis of lumbar spine 09/23/2016   Sleep apnea 09/23/2016   Spinal stenosis of lumbar region  09/23/2016   Vertigo 09/23/2016   Hallux malleus 11/15/2015   Chronic pain syndrome    GERD (gastroesophageal reflux disease)    Depression    Rash 11/14/2015   Toe ulcer, right ([name]) 04/11/2015   Cellulitis of toe of right foot 04/11/2015   Nonspecific chest pain    Obesity (BMI 30-39.9) 10/26/2014   Hypokalemia 10/26/2014   Manic bipolar I disorder ([name]) 10/26/2014   Tardive dyskinesia 08/26/2011   Manic bipolar I disorder with rapid cycling ([name]) 05/18/2011    Class: Acute   Asthma 02/24/2011   History of migraine headaches 02/24/2011    Past Surgical History:  Procedure Laterality Date   ABDOMINAL  HYSTERECTOMY     AMPUTATION TOE Left 02/02/2018   Procedure: AMPUTATION TOE Left 4th toe;  Surgeon: Vivi Barrack, DPM;  Location: [facility];  Service: Podiatry;  Laterality: Left;   APPENDECTOMY N/A    Phreesia 02/08/2020   BACK SURGERY  2018   ACDF   C5-6 & C6-7 by Dr. Sharolyn Douglas   CARPAL TUNNEL RELEASE     x2   I & D EXTREMITY Right 05/22/2022   Procedure: IRRIGATION AND DEBRIDEMENT EXTREMITY;  Surgeon: Candelaria Stagers, DPM;  Location: [facility] ORS;  Service: Podiatry;  Laterality: Right;   INCISION AND DRAINAGE Right 05/24/2022   Procedure: RIGHT REVISION INCISION AND DRAINAGE/ WASHOUT WITH PRIMARY DELAYED CLOSURE;  Surgeon: Edwin Cap, DPM;  Location: [facility];  Service: Podiatry;  Laterality: Right;   IRRIGATION AND DEBRIDEMENT ABSCESS Right 10/25/2020   Procedure: IRRIGATION AND DEBRIDEMENT ABSCESS OF FOOT AND APPLICATION OF GRAFT;  Surgeon: Vivi Barrack, DPM;  Location: [facility] OR;  Service: Podiatry;  Laterality: Right;   Laproscopic knee surgery Right    NECK SURGERY  03/15/2017   ACDF   by Dr. Noel Gerold   PLANTAR FASCIA RELEASE     x2   REVERSE SHOULDER ARTHROPLASTY Right 04/14/2022   Procedure: REVERSE SHOULDER ARTHROPLASTY;  Surgeon: Bjorn Pippin, MD;  Location: [facility];  Service: Orthopedics;  Laterality: Right;   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   Procedure: TRANSMETATARSAL AMPUTATION;  Surgeon: Vivi Barrack, DPM;  Location: [facility];  Service: Podiatry;  Laterality: Right;   TRANSMETATARSAL AMPUTATION Right 05/27/2022   Procedure: REVISION TRANSMETATARSAL AMPUTATION WITH DELAYED PRIMARY CLOSURE;  Surgeon: Felecia Shelling, DPM;  Location: [facility];  Service: Podiatry;  Laterality: Right;    OB History     Gravida  3   Para  3   Term  3   Preterm      AB      Living  3      SAB      IAB      Ectopic      Multiple      Live Births  3            Home Medications    Prior to Admission medications   Medication Sig Start Date End Date  Taking? Authorizing Provider  sulfamethoxazole-trimethoprim (BACTRIM DS) 800-160 MG tablet Take 1 tablet by mouth 2 (two) times daily for 7 days. 12/17/22 12/24/22 Yes Shayaan Parke-Warren, Sadie Haber, NP  albuterol (PROAIR HFA) 108 (90 Base) MCG/ACT inhaler Inhale 2 puffs = into the lungs every 6 (six) hours as needed for wheezing or shortness of breath. Patient taking differently: Inhale 2 puffs into the lungs every 6 (six) hours as needed for wheezing or shortness of breath. 07/11/20   Valentino Nose, NP  busPIRone (BUSPAR)  15 MG tablet Take 15 mg by mouth 3 (three) times daily.  04/26/17   [provider]  Calcium Carbonate-Vit D-Min (CALCIUM 1200 PO) Take 1,200 mg by mouth daily.     [provider]  DULoxetine (CYMBALTA) 60 MG capsule TAKE ONE CAPSULE BY MOUTH ONCE DAILY 12/06/22   Donita Brooks, MD  Erenumab-aooe (AIMOVIG) 70 MG/ML SOAJ administer 1mL UNDER THE SKIN every 30 DAYS 10/08/22   Donita Brooks, MD  escitalopram (LEXAPRO) 10 MG tablet Take 10 mg by mouth daily. 08/23/19   [provider]  estradiol (ESTRACE) 1 MG tablet TAKE ONE TABLET BY MOUTH EVERYDAY AT BEDTIME 07/19/22   Donita Brooks, MD  famotidine (PEPCID) 40 MG tablet TAKE ONE TABLET BY MOUTH EVERYDAY AT BEDTIME 10/29/22   Donita Brooks, MD  ferrous sulfate (FEROSUL) 325 (65 FE) MG tablet TAKE ONE TABLET BY MOUTH AT [facility] AND AT BEDTIME 11/30/22   Donita Brooks, MD  furosemide (LASIX) 40 MG tablet Take 1 tablet (40 mg total) by mouth daily. 06/18/22   Donita Brooks, MD  haloperidol (HALDOL) 5 MG tablet Take 5 mg by mouth at bedtime. 11/11/22   [provider]  lamoTRIgine (LAMICTAL) 100 MG tablet Take 100 mg by mouth daily. 08/06/21   [provider]  levocetirizine (XYZAL) 5 MG tablet TAKE ONE TABLET BY MOUTH EVERY EVENING 10/29/22   Donita Brooks, MD  losartan (COZAAR) 50 MG tablet Take 1 tablet (50 mg total) by mouth daily. 06/27/22 09/25/22  Pokhrel, Rebekah Chesterfield, MD   methocarbamol (ROBAXIN) 500 MG tablet Take 500 mg by mouth every 8 (eight) hours as needed.    [provider]  metoprolol succinate (TOPROL-XL) 25 MG 24 hr tablet Take 25 mg by mouth daily. 10/04/22   [provider]  Multiple Vitamins-Minerals (MULTIVITAMIN WITH MINERALS) tablet Take 1 tablet by mouth daily.    [provider]  naloxone [facility]) nasal spray 4 mg/0.1 mL Place 0.4 mg into the nose once. 08/03/21   [provider]  oxyCODONE-acetaminophen (PERCOCET) 10-325 MG tablet Take one tablet by mouth up to 4 times daily as needed.    [provider]  pantoprazole (PROTONIX) 40 MG tablet Take 1 tablet (40 mg total) by mouth 2 (two) times daily. 02/02/22   Donita Brooks, MD  polyethylene glycol (MIRALAX / GLYCOLAX) 17 g packet Take 17 g by mouth daily. 07/13/22   Kathlen Mody, MD  prazosin (MINIPRESS) 2 MG capsule Take 2 mg by mouth at bedtime.    [provider]  pregabalin (LYRICA) 100 MG capsule Take 1 capsule (100 mg total) by mouth 2 (two) times daily. Patient taking differently: Take 100 mg by mouth 3 (three) times daily. 07/27/21   Donita Brooks, MD  solifenacin (VESICARE) 10 MG tablet TAKE ONE TABLET BY MOUTH ONCE DAILY 10/29/22   Donita Brooks, MD  SUMAtriptan (IMITREX) 50 MG tablet Take 50 mg by mouth every 2 (two) hours as needed for migraine. 08/30/19   [provider]  topiramate (TOPAMAX) 50 MG tablet TAKE ONE TABLET BY MOUTH TWICE DAILY 10/29/22   Donita Brooks, MD  traZODone (DESYREL) 100 MG tablet Take 1 tablet (100 mg total) by mouth at bedtime. Patient taking differently: Take 200 mg by mouth at bedtime. 07/12/22   Kathlen Mody, MD    Family History Family History  Problem Relation Age of Onset   Diabetes Mother    COPD Mother    Hypertension  Mother    Hyperlipidemia Mother    Sleep apnea Mother    Heart disease Father    Hyperlipidemia Father    Hypertension Father    Cancer Father    Sleep  apnea Father    Heart disease Brother    Sleep apnea Brother    Heart disease Maternal Grandmother    Hypertension Maternal Grandmother    Sleep apnea Maternal Grandmother    Heart disease Maternal Grandfather    Kidney cancer Paternal Grandmother    Heart disease Paternal Grandfather    Heart disease Daughter     Social History Social History   Tobacco Use   Smoking status: Never   Smokeless tobacco: Never  Vaping Use   Vaping status: Never Used  Substance Use Topics   Alcohol use: No   Drug use: No     Allergies   Tetracyclines & related, Tramadol, Oxybutynin, Phenazopyridine, Ciprofloxacin, Codeine, and Estradiol   Review of Systems Review of Systems Per HPI  Physical Exam Triage Vital Signs ED Triage Vitals  Encounter Vitals Group     BP 12/17/22 1552 (!) 166/97     Systolic BP Percentile --      Diastolic BP Percentile --      Pulse Rate 12/17/22 1552 96     Resp 12/17/22 1552 16     Temp 12/17/22 1552 97.9 F (36.6 C)     Temp Source 12/17/22 1552 Oral     SpO2 12/17/22 1552 98 %     Weight --      Height --      Head Circumference --      Peak Flow --      Pain Score 12/17/22 1556 0     Pain Loc --      Pain Education --      Exclude from Growth Chart --    No data found.  Updated Vital Signs BP (!) 166/97 (BP Location: Right Arm)   Pulse 96   Temp 97.9 F (36.6 C) (Oral)   Resp 16   SpO2 98%   Visual Acuity Right Eye Distance:   Left Eye Distance:   Bilateral Distance:    Right Eye Near:   Left Eye Near:    Bilateral Near:     Physical Exam Vitals and nursing note reviewed.  Constitutional:      General: She is not in acute distress.    Appearance: Normal appearance.  HENT:     Head: Normocephalic.  Eyes:     Extraocular Movements: Extraocular movements intact.     Pupils: Pupils are equal, round, and reactive to light.  Cardiovascular:     Rate and Rhythm: Normal rate and regular rhythm.     Pulses: Normal pulses.      Heart sounds: Normal heart sounds.  Pulmonary:     Effort: Pulmonary effort is normal.     Breath sounds: Normal breath sounds.  Abdominal:     General: Bowel sounds are normal.     Palpations: Abdomen is soft.  Musculoskeletal:     Cervical back: Normal range of motion.  Feet:     Right foot:     Skin integrity: Ulcer present. No erythema or warmth.     Comments: Amputation of right foot, stump is present.  Open ulcer noted to the right foot stump.  Darkened area noted above the ulcer.  Noted swelling also surrounding the ulcer. Lymphadenopathy:     Cervical: No cervical adenopathy.  Skin:    General: Skin is warm and dry.  Neurological:     General: No focal deficit present.     Mental Status: She is alert and oriented to person, place, and time.  Psychiatric:        Mood and Affect: Mood normal.        Behavior: Behavior normal.      UC Treatments / Results  Labs (all labs ordered are listed, but only abnormal results are displayed) Labs Reviewed - No data to display  EKG   Radiology No results found.  Procedures Procedures (including critical care time)  Medications Ordered in UC Medications - No data to display  Initial Impression / Assessment and Plan / UC Course  I have reviewed the triage vital signs and the nursing notes.  Pertinent labs & imaging results that were available during my care of the patient were reviewed by me and considered in my medical decision making (see chart for details).  The patient is well-appearing, she is in no acute distress, vital signs are stable.  Will start patient on Bactrim DS 800/160 mg tablets for possible cellulitis of the right foot stump.  Supportive care recommendations were provided and discussed with the patient to include keeping the area clean and dry, over-the-counter analgesics for pain or discomfort, and monitoring the area for worsening.  Patient was advised if symptoms do worsen, it is recommended that she go  to the emergency department for further evaluation.  Patient also advised to keep scheduled appointment with podiatrist next week.  Patient is in agreement with this plan of care and verbalizes understanding.  All questions were answered.  Patient stable for discharge.  Final Clinical Impressions(s) / UC Diagnoses   Final diagnoses:  Ulcer of amputation stump of foot [facility])     Discharge Instructions      Take medication as prescribed. May take over-the-counter Tylenol or ibuprofen as needed for pain, fever, or general discomfort. Cleanse the area with an antibacterial soap twice daily.  You may use over-the-counter Dial gold bar soap for cleansing. Continue to monitor the area for worsening.  If you notice increased redness, swelling, foul-smelling drainage, or other concerns, please follow-up in the emergency department immediately. Keep scheduled appointment with podiatrist for next week. Follow-up as needed.     ED Prescriptions     Medication Sig Dispense Auth. Provider   sulfamethoxazole-trimethoprim (BACTRIM DS) 800-160 MG tablet Take 1 tablet by mouth 2 (two) times daily for 7 days. 14 tablet Presleigh Feldstein-Warren, Sadie Haber, NP      PDMP not reviewed this encounter.   Abran Cantor, NP 12/17/22 [phone]

## 2022-12-17 NOTE — ED Triage Notes (Signed)
Pt states sore to right foot stump pt has all toes on right foot amputated.  Open sore noted to right stump no drainage noted at this time.

## 2022-12-21 ENCOUNTER — Ambulatory Visit: Payer: Self-pay | Admitting: *Deleted

## 2022-12-21 NOTE — Patient Outreach (Signed)
  Care Coordination   Follow Up Visit Note   12/22/2022 Name: [name] [name] MRN: [id number] DOB: January 02, 1966  [name] [name] is a 57 y.o. year old female who sees Pickard, Priscille Heidelberg, MD for primary care. I spoke with  [name] [name] by phone today.  What matters to the patients health and wellness today?  Recent ED visit for "infected" ulcer on her amputee stump, obtained antibiotics She confirmed having worsening pain, swelling, redness and discharge.  She was not able to make it to her pcp office prior to closing for the Labor day holiday.   Pain of right hip  Confirms having pain medicine at home to assist with management Voiced understanding of breakthrough pain medicine (Tylenol) usage She will speak with her pain management provider During her upcoming office visit   Goals Addressed             This Visit's Progress    Home management of Congestive heart failure/infected stump ulcer- [facility] care coordination services   Not on track    Interventions Today    Flowsheet Row Most Recent Value  Chronic Disease   Chronic disease during today's visit Congestive Heart Failure (CHF), Other  [ED visit, ulcer on amputed stump, received antibiotics]  General Interventions   General Interventions Discussed/Reviewed Sick Day Rules, General Interventions Reviewed, Doctor Visits, Holiday representative Visits Discussed/Reviewed Doctor Visits Reviewed, PCP  PCP/Specialist Visits Compliance with follow-up visit  Exercise Interventions   Exercise Discussed/Reviewed Physical Activity, Weight Managment, Assistive device use and maintanence  Physical Activity Discussed/Reviewed Physical Activity Reviewed, Home Exercise Program (HEP)  Weight Management Weight maintenance  Education Interventions   Education Provided Provided Education  Provided Verbal Education On Other, Medication  [Infection symptoms reviewed]              SDOH assessments and interventions completed:  No      Care Coordination Interventions:  Yes, provided   Follow up plan: Follow up call scheduled for 12/29/22    Encounter Outcome:  Pt. Visit Completed {THN Tip this will not be part of the note when signed-REQUIRED REPORT FIELD DO NOT DELETE (Optional):[id number]   [name], RN, BSN, CCM, Care Management Coordinator [phone]  [name], RN, BSN, CCM, Care Management Coordinator [phone]

## 2022-12-22 NOTE — Patient Instructions (Signed)
Visit Information  Thank you for taking time to visit with me today. Please don't hesitate to contact me if I can be of assistance to you.   Following are the goals we discussed today:   Goals Addressed             This Visit's Progress    Home management of Congestive heart failure/infected stump ulcer- [facility] care coordination services   Not on track    Interventions Today    Flowsheet Row Most Recent Value  Chronic Disease   Chronic disease during today's visit Congestive Heart Failure (CHF), Other  [ED visit, ulcer on amputed stump, received antibiotics]  General Interventions   General Interventions Discussed/Reviewed Sick Day Rules, General Interventions Reviewed, Doctor Visits, Community Resources  Doctor Visits Discussed/Reviewed Doctor Visits Reviewed, PCP  PCP/Specialist Visits Compliance with follow-up visit  Exercise Interventions   Exercise Discussed/Reviewed Physical Activity, Weight Managment, Assistive device use and maintanence  Physical Activity Discussed/Reviewed Physical Activity Reviewed, Home Exercise Program (HEP)  Weight Management Weight maintenance  Education Interventions   Education Provided Provided Education  Provided Verbal Education On Other, Medication  [Infection symptoms reviewed]              Our next appointment is by telephone on 12/29/22 at 315 pm  Please call the care guide team at [phone] if you need to cancel or reschedule your appointment.   If you are experiencing a Mental Health or Behavioral Health Crisis or need someone to talk to, please call the Suicide and Crisis Lifeline: 988 call the [place] National Suicide Prevention Lifeline: 765-645-6859 or TTY: (681)168-4621 TTY 808-127-7266) to talk to a trained counselor call 1-800-273-TALK (toll free, 24 hour hotline) call the [facility]: 212-584-0407 call 911   Patient verbalizes understanding of instructions and care plan provided today and agrees to view  in MyChart. Active MyChart status and patient understanding of how to access instructions and care plan via MyChart confirmed with patient.     The patient has been provided with contact information for the care management team and has been advised to call with any health related questions or concerns.   Veron Senner L. Noelle Penner, RN, BSN, CCM, Care Management Coordinator [phone]

## 2022-12-24 ENCOUNTER — Ambulatory Visit (INDEPENDENT_AMBULATORY_CARE_PROVIDER_SITE_OTHER): Payer: 59 | Admitting: Podiatry

## 2022-12-24 ENCOUNTER — Other Ambulatory Visit: Payer: Self-pay | Admitting: Family Medicine

## 2022-12-24 DIAGNOSIS — L97512 Non-pressure chronic ulcer of other part of right foot with fat layer exposed: Secondary | ICD-10-CM

## 2022-12-24 MED ORDER — SILVER SULFADIAZINE 1 % EX CREA: 1.0000 | TOPICAL_CREAM | Freq: Every day | CUTANEOUS | 1 refills | Status: DC

## 2022-12-24 NOTE — Progress Notes (Signed)
Chief Complaint  Patient presents with   blisters    right foot has sores coming on amputated foot   Foot Ulcer    Right ball of  foot ulcer, since 1 week ago    Subjective:  57 y.o. female with past history transmetatarsal amputation of the right foot presenting for onset of an ulcer that developed about 1 week ago.  Since that time she has been in a boot and applying Neosporin.  She does have a history of the [facility] wound care center.  Past Medical History:  Diagnosis Date   Acute metabolic encephalopathy 06/08/2022   AMS (altered mental status) 05/22/2022   Anxiety    Arthritis    Phreesia 02/08/2020   Asthma    mild intermittent   Asthma    Phreesia 02/08/2020   Bipolar 1 disorder ([name])    ect treatments last treatment Sep 02 1011   Depression    Depression    Phreesia 02/08/2020   Depression    Phreesia 07/10/2020   GERD (gastroesophageal reflux disease)    Headache    Hypertension    Pre-diabetes    Seizures ([name])    Thinks it was from taking Tramadol   Septic shock ([name]) 05/22/2022   Shortness of breath 09/23/2016   Overview:   exertional   Sleep apnea    AHI 40 (2024)   Substance abuse ([name])    Phreesia 02/08/2020   Syncope 08/26/2019   Syncope and collapse     Past Surgical History:  Procedure Laterality Date   ABDOMINAL HYSTERECTOMY     AMPUTATION TOE Left 02/02/2018   Procedure: AMPUTATION TOE Left 4th toe;  Surgeon: Vivi Barrack, DPM;  Location: [facility] OR;  Service: Podiatry;  Laterality: Left;   APPENDECTOMY N/A    Phreesia 02/08/2020   BACK SURGERY  2018   ACDF   C5-6 & C6-7 by Dr. Sharolyn Douglas   CARPAL TUNNEL RELEASE     x2   I & D EXTREMITY Right 05/22/2022   Procedure: IRRIGATION AND DEBRIDEMENT EXTREMITY;  Surgeon: Candelaria Stagers, DPM;  Location: [facility] ORS;  Service: Podiatry;  Laterality: Right;   INCISION AND DRAINAGE Right 05/24/2022   Procedure: RIGHT REVISION INCISION AND DRAINAGE/ WASHOUT WITH PRIMARY DELAYED CLOSURE;  Surgeon: Edwin Cap, DPM;  Location: [facility] ORS;  Service: Podiatry;  Laterality: Right;   IRRIGATION AND DEBRIDEMENT ABSCESS Right 10/25/2020   Procedure: IRRIGATION AND DEBRIDEMENT ABSCESS OF FOOT AND APPLICATION OF GRAFT;  Surgeon: Vivi Barrack, DPM;  Location: [facility] OR;  Service: Podiatry;  Laterality: Right;   Laproscopic knee surgery Right    NECK SURGERY  03/15/2017   ACDF   by Dr. Noel Gerold   PLANTAR FASCIA RELEASE     x2   REVERSE SHOULDER ARTHROPLASTY Right 04/14/2022   Procedure: REVERSE SHOULDER ARTHROPLASTY;  Surgeon: Bjorn Pippin, MD;  Location: [facility] ORS;  Service: Orthopedics;  Laterality: Right;   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   Procedure: TRANSMETATARSAL AMPUTATION;  Surgeon: Vivi Barrack, DPM;  Location: [facility]S;  Service: Podiatry;  Laterality: Right;   TRANSMETATARSAL AMPUTATION Right 05/27/2022   Procedure: REVISION TRANSMETATARSAL AMPUTATION WITH DELAYED PRIMARY CLOSURE;  Surgeon: Felecia Shelling, DPM;  Location: [facility] ORS;  Service: Podiatry;  Laterality: Right;    Allergies  Allergen Reactions   Tetracyclines & Related Other (See Comments)    Syncope and put her "in a coma"   Tramadol Other (See Comments)    Seizures  Oxybutynin Other (See Comments)    Unknown reaction per Mom   Phenazopyridine Other (See Comments)    Unknown reaction   Ciprofloxacin Rash and Itching   Codeine Itching and Rash   Estradiol Rash    Patches broke out the skin     RT foot 12/24/2022  Objective/Physical Exam General: The patient is alert and oriented x3 in no acute distress.  Dermatology:  Wound #1 noted to the plantar aspect of the right foot amputation stump measuring approximately 1.0 x 1.0 x 0.1 cm (LxWxD).   To the noted ulceration(s), there is no eschar. There is a moderate amount of slough, fibrin, and necrotic tissue noted. Granulation tissue and wound base is red. There is a minimal amount of serosanguineous drainage noted. There is no exposed bone muscle-tendon  ligament or joint. There is no malodor. Periwound integrity is intact. Skin is warm, dry and supple bilateral lower extremities.  Vascular: Pulses palpable.  Skin warm to touch.  Neurological: Light touch and protective threshold diminished bilaterally.   Musculoskeletal Exam: Prior history of transmetatarsal amputation of the right foot with routine healing  Assessment: 1.  Ulcer right foot amputation stump 2.  History of transmetatarsal amputation right foot   Plan of Care:  1. Patient was evaluated. 2. medically necessary excisional debridement including subcutaneous tissue was performed using a tissue nipper and a chisel blade. Excisional debridement of all the necrotic nonviable tissue down to healthy bleeding viable tissue was performed with post-debridement measurements same as pre-. 3. the wound was cleansed and dry sterile dressing applied. 4.  Prescription for Silvadene cream apply daily 5.  The patient has an established history with the wound care center.  I do believe she would benefit from weekly wound care.  Referral placed.  Greatly appreciated 6.  Cam boot dispensed.  WBAT  7.  Return to clinic as needed   Felecia Shelling, DPM [facility]  Dr. Felecia Shelling, DPM    [address] [place], [place] [address]                Office [phone]  Fax [phone]

## 2022-12-24 NOTE — Addendum Note (Signed)
Addended by: Felecia Shelling on: 12/24/2022 11:32 AM   Modules accepted: Orders

## 2022-12-27 ENCOUNTER — Other Ambulatory Visit: Payer: Self-pay | Admitting: Family Medicine

## 2022-12-27 DIAGNOSIS — K219 Gastro-esophageal reflux disease without esophagitis: Secondary | ICD-10-CM

## 2022-12-27 NOTE — Telephone Encounter (Signed)
Requested Prescriptions  Pending Prescriptions Disp Refills   levocetirizine (XYZAL) 5 MG tablet [Pharmacy Med Name: LEVOCETIRIZINE 5MG  TAB 5 Tablet] 90 tablet 3    Sig: TAKE 1 TABLET BY MOUTH EVERY EVENING     Ear, Nose, and Throat:  Antihistamines - levocetirizine dihydrochloride Failed - 12/24/2022  6:25 PM      Failed - Valid encounter within last 12 months    Recent Outpatient Visits           1 year ago Benign essential HTN   [facility] Pickard, Priscille Heidelberg, MD   1 year ago Infectious diarrhea   [facility] Tanya Nones, Priscille Heidelberg, MD   1 year ago Blood in stool, frank   [facility] Donita Brooks, MD   2 years ago 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)   [facility] Tanya Nones, Priscille Heidelberg, MD   2 years ago Upper respiratory tract infection, unspecified type   [facility] Medicine Valentino Nose, NP              Passed - Cr in normal range and within 360 days    Creat  Date Value Ref Range Status  12/10/2022 0.96 0.50 - 1.03 mg/dL Final   Creatinine, Urine  Date Value Ref Range Status  05/31/2022 203 mg/dL Final    Comment:    Performed at [facility], [address] [place], [place] [address]         Passed - eGFR is 10 or above and within 360 days    GFR, Est African American  Date Value Ref Range Status  06/27/2020 119 > OR = 60 mL/min/1.58m2 Final   GFR, Est Non African American  Date Value Ref Range Status  06/27/2020 102 > OR = 60 mL/min/1.36m2 Final   GFR, Estimated  Date Value Ref Range Status  07/22/2022 49 (L) >60 mL/min Final    Comment:    (NOTE) Calculated using the CKD-EPI Creatinine Equation (2021)    eGFR  Date Value Ref Range Status  12/10/2022 68 > OR = 60 mL/min/1.72m2 Final

## 2022-12-28 DIAGNOSIS — G894 Chronic pain syndrome: Secondary | ICD-10-CM | POA: Diagnosis not present

## 2022-12-28 DIAGNOSIS — G4733 Obstructive sleep apnea (adult) (pediatric): Secondary | ICD-10-CM | POA: Diagnosis not present

## 2022-12-28 DIAGNOSIS — M545 Low back pain, unspecified: Secondary | ICD-10-CM | POA: Diagnosis not present

## 2022-12-28 DIAGNOSIS — M869 Osteomyelitis, unspecified: Secondary | ICD-10-CM | POA: Diagnosis not present

## 2022-12-28 DIAGNOSIS — R531 Weakness: Secondary | ICD-10-CM | POA: Diagnosis not present

## 2022-12-28 NOTE — Telephone Encounter (Signed)
Last OV 12/10/22. Requesting change in pharmacy Requested Prescriptions  Pending Prescriptions Disp Refills   famotidine (PEPCID) 40 MG tablet [Pharmacy Med Name: FAMOTIDINE 40 MG TABLET 40 Tablet] 30 tablet 10    Sig: TAKE 1 TABLET BY MOUTH EVERY DAY AT BEDTIME     Gastroenterology:  H2 Antagonists Failed - 12/27/2022  8:24 AM      Failed - Valid encounter within last 12 months    Recent Outpatient Visits           1 year ago Benign essential HTN   [facility] Pickard, Priscille Heidelberg, MD   1 year ago Infectious diarrhea   [facility] Tanya Nones, Priscille Heidelberg, MD   1 year ago Blood in stool, frank   [facility] Donita Brooks, MD   2 years ago 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)   [facility] Tanya Nones, Priscille Heidelberg, MD   2 years ago Upper respiratory tract infection, unspecified type   [facility] Medicine Cathlean Marseilles A, NP               solifenacin (VESICARE) 10 MG tablet [Pharmacy Med Name: SOLIFENACIN 10MG  TAB 10 Tablet] 30 tablet 10    Sig: TAKE 1 TABLET BY MOUTH ONCE DAILY     Urology:  Bladder Agents 2 Failed - 12/27/2022  8:24 AM      Failed - AST in normal range and within 360 days    AST  Date Value Ref Range Status  12/10/2022 8 (L) 10 - 35 U/L Final         Failed - Valid encounter within last 12 months    Recent Outpatient Visits           1 year ago Benign essential HTN   [facility] Medicine Pickard, Priscille Heidelberg, MD   1 year ago Infectious diarrhea   [facility] Medicine Tanya Nones, Priscille Heidelberg, MD   1 year ago Blood in stool, frank   [facility] Medicine Tanya Nones, Priscille Heidelberg, MD   2 years ago 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)   [facility] Medicine Tanya Nones, Priscille Heidelberg, MD   2 years ago Upper respiratory tract infection, unspecified type   [facility] Medicine Valentino Nose, NP              Passed - Cr in normal range and  within 360 days    Creat  Date Value Ref Range Status  12/10/2022 0.96 0.50 - 1.03 mg/dL Final   Creatinine, Urine  Date Value Ref Range Status  05/31/2022 203 mg/dL Final    Comment:    Performed at [facility], [address] [place], [place] [address]         Passed - ALT in normal range and within 360 days    ALT  Date Value Ref Range Status  12/10/2022 6 6 - 29 U/L Final

## 2022-12-28 NOTE — Telephone Encounter (Signed)
Requested Prescriptions  Pending Prescriptions Disp Refills   DULoxetine (CYMBALTA) 60 MG capsule [Pharmacy Med Name: DULOXETINE 60MG  CAPSULE 60 Capsule] 90 capsule 0    Sig: TAKE 1 CAPSULE BY MOUTH ONCE DAILY *REFILL REQUEST*     Psychiatry: Antidepressants - SNRI - duloxetine Failed - 12/27/2022  6:30 PM      Failed - Last BP in normal range    BP Readings from Last 1 Encounters:  12/17/22 (!) 166/97         Failed - Valid encounter within last 6 months    Recent Outpatient Visits           1 year ago Benign essential HTN   [facility] Tanya Nones, Priscille Heidelberg, MD   1 year ago Infectious diarrhea   [facility] Tanya Nones, Priscille Heidelberg, MD   1 year ago Blood in stool, frank   [facility] Medicine Donita Brooks, MD   2 years ago 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)   [facility] Medicine Donita Brooks, MD   2 years ago Upper respiratory tract infection, unspecified type   [facility] Medicine Valentino Nose, NP              Passed - Cr in normal range and within 360 days    Creat  Date Value Ref Range Status  12/10/2022 0.96 0.50 - 1.03 mg/dL Final   Creatinine, Urine  Date Value Ref Range Status  05/31/2022 203 mg/dL Final    Comment:    Performed at [facility], [address] [place], [place] [address]         Passed - eGFR is 30 or above and within 360 days    GFR, Est African American  Date Value Ref Range Status  06/27/2020 119 > OR = 60 mL/min/1.79m2 Final   GFR, Est Non African American  Date Value Ref Range Status  06/27/2020 102 > OR = 60 mL/min/1.75m2 Final   GFR, Estimated  Date Value Ref Range Status  07/22/2022 49 (L) >60 mL/min Final    Comment:    (NOTE) Calculated using the CKD-EPI Creatinine Equation (2021)    eGFR  Date Value Ref Range Status  12/10/2022 68 > OR = 60 mL/min/1.48m2 Final         Passed - Completed PHQ-2 or PHQ-9 in the last 360 days        levocetirizine (XYZAL) 5 MG tablet [Pharmacy Med Name: LEVOCETIRIZINE 5MG  TAB 5 Tablet] 30 tablet 10    Sig: TAKE 1 TABLET BY MOUTH EVERY EVENING     Ear, Nose, and Throat:  Antihistamines - levocetirizine dihydrochloride Failed - 12/27/2022  6:30 PM      Failed - Valid encounter within last 12 months    Recent Outpatient Visits           1 year ago Benign essential HTN   [facility] Donita Brooks, MD   1 year ago Infectious diarrhea   [facility] Medicine Tanya Nones, Priscille Heidelberg, MD   1 year ago Blood in stool, frank   [facility] Medicine Donita Brooks, MD   2 years ago 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)   [facility] Medicine Donita Brooks, MD   2 years ago Upper respiratory tract infection, unspecified type   [facility] Medicine Valentino Nose, NP  Passed - Cr in normal range and within 360 days    Creat  Date Value Ref Range Status  12/10/2022 0.96 0.50 - 1.03 mg/dL Final   Creatinine, Urine  Date Value Ref Range Status  05/31/2022 203 mg/dL Final    Comment:    Performed at [facility], [address] [place], [place] [address]         Passed - eGFR is 10 or above and within 360 days    GFR, Est African American  Date Value Ref Range Status  06/27/2020 119 > OR = 60 mL/min/1.74m2 Final   GFR, Est Non African American  Date Value Ref Range Status  06/27/2020 102 > OR = 60 mL/min/1.52m2 Final   GFR, Estimated  Date Value Ref Range Status  07/22/2022 49 (L) >60 mL/min Final    Comment:    (NOTE) Calculated using the CKD-EPI Creatinine Equation (2021)    eGFR  Date Value Ref Range Status  12/10/2022 68 > OR = 60 mL/min/1.41m2 Final

## 2022-12-28 NOTE — Telephone Encounter (Signed)
Last OV 12/10/22 Requested Prescriptions  Pending Prescriptions Disp Refills   topiramate (TOPAMAX) 50 MG tablet [Pharmacy Med Name: TOPIRAMATE 50 MG TABS 50 Tablet] 60 tablet 10    Sig: TAKE ONE (1) TABLET BY MOUTH TWICE DAILY     Neurology: Anticonvulsants - topiramate & zonisamide Failed - 12/27/2022  8:24 AM      Failed - AST in normal range and within 360 days    AST  Date Value Ref Range Status  12/10/2022 8 (L) 10 - 35 U/L Final         Failed - Valid encounter within last 12 months    Recent Outpatient Visits           1 year ago Benign essential HTN   [facility] Pickard, Priscille Heidelberg, MD   1 year ago Infectious diarrhea   [facility] Donita Brooks, MD   1 year ago Blood in stool, frank   [facility] Medicine Pickard, Priscille Heidelberg, MD   2 years ago 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)   [facility] Donita Brooks, MD   2 years ago Upper respiratory tract infection, unspecified type   [facility] Medicine Valentino Nose, NP              Passed - Cr in normal range and within 360 days    Creat  Date Value Ref Range Status  12/10/2022 0.96 0.50 - 1.03 mg/dL Final   Creatinine, Urine  Date Value Ref Range Status  05/31/2022 203 mg/dL Final    Comment:    Performed at [facility], [address] [place], [place] [address]         Passed - CO2 in normal range and within 360 days    CO2  Date Value Ref Range Status  12/10/2022 25 20 - 32 mmol/L Final   Bicarbonate  Date Value Ref Range Status  07/06/2022 27.2 20.0 - 28.0 mmol/L Final         Passed - ALT in normal range and within 360 days    ALT  Date Value Ref Range Status  12/10/2022 6 6 - 29 U/L Final         Passed - Completed PHQ-2 or PHQ-9 in the last 360 days       pantoprazole (PROTONIX) 40 MG tablet [Pharmacy Med Name: PANTOPRAZOLE DR 40MG  TAB 40 Tablet] 60 tablet 10    Sig: TAKE ONE (1) TABLET BY  MOUTH TWICE DAILY     Gastroenterology: Proton Pump Inhibitors Failed - 12/27/2022  8:24 AM      Failed - Valid encounter within last 12 months    Recent Outpatient Visits           1 year ago Benign essential HTN   [facility] Pickard, Priscille Heidelberg, MD   1 year ago Infectious diarrhea   [facility] Medicine Tanya Nones, Priscille Heidelberg, MD   1 year ago Blood in stool, frank   [facility] Donita Brooks, MD   2 years ago 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)   [facility] Donita Brooks, MD   2 years ago Upper respiratory tract infection, unspecified type   [facility] Medicine Valentino Nose, NP              Refused Prescriptions Disp Refills   famotidine (PEPCID)  40 MG tablet [Pharmacy Med Name: FAMOTIDINE 40 MG TABLET 40 Tablet] 30 tablet 10    Sig: TAKE 1 TABLET BY MOUTH EVERY DAY AT BEDTIME     Gastroenterology:  H2 Antagonists Failed - 12/27/2022  8:24 AM      Failed - Valid encounter within last 12 months    Recent Outpatient Visits           1 year ago Benign essential HTN   [facility] Pickard, Priscille Heidelberg, MD   1 year ago Infectious diarrhea   [facility] Medicine Tanya Nones, Priscille Heidelberg, MD   1 year ago Blood in stool, frank   [facility] Medicine Donita Brooks, MD   2 years ago 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)   [facility] Family Medicine Donita Brooks, MD   2 years ago Upper respiratory tract infection, unspecified type   [facility] Medicine Cathlean Marseilles A, NP               solifenacin (VESICARE) 10 MG tablet [Pharmacy Med Name: SOLIFENACIN 10MG  TAB 10 Tablet] 30 tablet 10    Sig: TAKE 1 TABLET BY MOUTH ONCE DAILY     Urology:  Bladder Agents 2 Failed - 12/27/2022  8:24 AM      Failed - AST in normal range and within 360 days    AST  Date Value Ref Range Status  12/10/2022 8 (L) 10 - 35 U/L Final         Failed  - Valid encounter within last 12 months    Recent Outpatient Visits           1 year ago Benign essential HTN   [facility] Pickard, Priscille Heidelberg, MD   1 year ago Infectious diarrhea   [facility] Tanya Nones, Priscille Heidelberg, MD   1 year ago Blood in stool, frank   [facility] Medicine Tanya Nones, Priscille Heidelberg, MD   2 years ago 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)   [facility] Family Medicine Tanya Nones, Priscille Heidelberg, MD   2 years ago Upper respiratory tract infection, unspecified type   [facility] Medicine Valentino Nose, NP              Passed - Cr in normal range and within 360 days    Creat  Date Value Ref Range Status  12/10/2022 0.96 0.50 - 1.03 mg/dL Final   Creatinine, Urine  Date Value Ref Range Status  05/31/2022 203 mg/dL Final    Comment:    Performed at [facility], [address] [place], [place] [address]         Passed - ALT in normal range and within 360 days    ALT  Date Value Ref Range Status  12/10/2022 6 6 - 29 U/L Final          levocetirizine (XYZAL) 5 MG tablet [Pharmacy Med Name: LEVOCETIRIZINE 5MG  TAB 5 Tablet] 30 tablet 10    Sig: TAKE 1 TABLET BY MOUTH EVERY EVENING     Ear, Nose, and Throat:  Antihistamines - levocetirizine dihydrochloride Failed - 12/27/2022  8:24 AM      Failed - Valid encounter within last 12 months    Recent Outpatient Visits           1 year ago Benign essential HTN   [facility] Pickard, Priscille Heidelberg, MD  1 year ago Infectious diarrhea   [facility] Pickard, Priscille Heidelberg, MD   1 year ago Blood in stool, frank   [facility] Medicine Tanya Nones, Priscille Heidelberg, MD   2 years ago 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)   [facility] Family Medicine Tanya Nones, Priscille Heidelberg, MD   2 years ago Upper respiratory tract infection, unspecified type   [facility] Medicine Valentino Nose, NP              Passed - Cr in  normal range and within 360 days    Creat  Date Value Ref Range Status  12/10/2022 0.96 0.50 - 1.03 mg/dL Final   Creatinine, Urine  Date Value Ref Range Status  05/31/2022 203 mg/dL Final    Comment:    Performed at [facility], [address] [place], [place] [address]         Passed - eGFR is 10 or above and within 360 days    GFR, Est African American  Date Value Ref Range Status  06/27/2020 119 > OR = 60 mL/min/1.70m2 Final   GFR, Est Non African American  Date Value Ref Range Status  06/27/2020 102 > OR = 60 mL/min/1.48m2 Final   GFR, Estimated  Date Value Ref Range Status  07/22/2022 49 (L) >60 mL/min Final    Comment:    (NOTE) Calculated using the CKD-EPI Creatinine Equation (2021)    eGFR  Date Value Ref Range Status  12/10/2022 68 > OR = 60 mL/min/1.72m2 Final

## 2022-12-28 NOTE — Telephone Encounter (Signed)
Requested Prescriptions  Pending Prescriptions Disp Refills   topiramate (TOPAMAX) 50 MG tablet [Pharmacy Med Name: TOPIRAMATE 50 MG TABS 50 Tablet] 60 tablet 10    Sig: TAKE ONE (1) TABLET BY MOUTH TWICE DAILY     Neurology: Anticonvulsants - topiramate & zonisamide Failed - 12/27/2022  8:24 AM      Failed - AST in normal range and within 360 days    AST  Date Value Ref Range Status  12/10/2022 8 (L) 10 - 35 U/L Final         Failed - Valid encounter within last 12 months    Recent Outpatient Visits           1 year ago Benign essential HTN   [facility] Pickard, Priscille Heidelberg, MD   1 year ago Infectious diarrhea   [facility] Donita Brooks, MD   1 year ago Blood in stool, frank   [facility] Pickard, Priscille Heidelberg, MD   2 years ago 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 [place], [place] [address]         Passed - CO2 in normal range and within 360 days    CO2  Date Value Ref Range Status  12/10/2022 25 20 - 32 mmol/L Final   Bicarbonate  Date Value Ref Range Status  07/06/2022 27.2 20.0 - 28.0 mmol/L Final         Passed - ALT in normal range and within 360 days    ALT  Date Value Ref Range Status  12/10/2022 6 6 - 29 U/L Final         Passed - Completed PHQ-2 or PHQ-9 in the last 360 days       pantoprazole (PROTONIX) 40 MG tablet [Pharmacy Med Name: PANTOPRAZOLE DR 40MG  TAB 40 Tablet] 60 tablet 10    Sig: TAKE ONE (1) TABLET BY MOUTH TWICE DAILY      Gastroenterology: Proton Pump Inhibitors Failed - 12/27/2022  8:24 AM      Failed - Valid encounter within last 12 months    Recent Outpatient Visits           1 year ago Benign essential HTN   [facility] Pickard, Priscille Heidelberg, MD   1 year ago Infectious diarrhea   [facility] Tanya Nones, Priscille Heidelberg, MD   1 year ago Blood in stool, frank   [facility] Medicine Donita Brooks, MD   2 years ago 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)   [facility] Donita Brooks, MD   2 years ago Upper respiratory tract infection, unspecified type   [facility] Medicine Valentino Nose, NP              Refused Prescriptions Disp Refills   levocetirizine (XYZAL) 5 MG tablet [  Pharmacy Med Name: LEVOCETIRIZINE 5MG  TAB 5 Tablet] 30 tablet 10    Sig: TAKE 1 TABLET BY MOUTH EVERY EVENING     Ear, Nose, and Throat:  Antihistamines - levocetirizine dihydrochloride Failed - 12/27/2022  8:24 AM      Failed - Valid encounter within last 12 months    Recent Outpatient Visits           1 year ago Benign essential HTN   [facility] Pickard, Priscille Heidelberg, MD   1 year ago Infectious diarrhea   [facility] Tanya Nones, Priscille Heidelberg, MD   1 year ago Blood in stool, frank   [facility] Donita Brooks, MD   2 years ago 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 [place], [place] [address]         Passed - eGFR is 10 or  above and within 360 days    GFR, Est African American  Date Value Ref Range Status  06/27/2020 119 > OR = 60 mL/min/1.19m2 Final   GFR, Est Non African American  Date Value Ref Range Status  06/27/2020 102 > OR = 60 mL/min/1.76m2 Final   GFR, Estimated  Date Value Ref Range Status  07/22/2022 49 (L) >60 mL/min Final    Comment:    (NOTE) Calculated using the CKD-EPI Creatinine Equation (2021)    eGFR  Date Value Ref Range Status  12/10/2022 68 > OR = 60 mL/min/1.14m2 Final          famotidine (PEPCID) 40 MG tablet [Pharmacy Med Name: FAMOTIDINE 40 MG TABLET 40 Tablet] 30 tablet 10    Sig: TAKE 1 TABLET BY MOUTH EVERY DAY AT BEDTIME     Gastroenterology:  H2 Antagonists Failed - 12/27/2022  8:24 AM      Failed - Valid encounter within last 12 months    Recent Outpatient Visits           1 year ago Benign essential HTN   [facility] Pickard, Priscille Heidelberg, MD   1 year ago Infectious diarrhea   [facility] Tanya Nones, Priscille Heidelberg, MD   1 year ago Blood in stool, frank   [facility] Medicine Tanya Nones, Priscille Heidelberg, MD   2 years ago 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)   [facility] Tanya Nones, Priscille Heidelberg, MD   2 years ago Upper respiratory tract infection, unspecified type   [facility] Medicine Cathlean Marseilles A, NP               solifenacin (VESICARE) 10 MG tablet [Pharmacy Med Name: SOLIFENACIN 10MG  TAB 10 Tablet] 30 tablet 10    Sig: TAKE 1 TABLET BY MOUTH ONCE DAILY     Urology:  Bladder Agents 2 Failed - 12/27/2022  8:24 AM      Failed - AST  in normal range and within 360 days    AST  Date Value Ref Range Status  12/10/2022 8 (L) 10 - 35 U/L Final         Failed - Valid encounter within last 12 months    Recent Outpatient Visits           1 year ago Benign essential HTN   [facility] Pickard, Priscille Heidelberg, MD   1 year ago Infectious diarrhea   [facility]  Tanya Nones, Priscille Heidelberg, MD   1 year ago Blood in stool, frank   [facility] Medicine Donita Brooks, MD   2 years ago 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 [place], [place] [address]         Passed - ALT in normal range and within 360 days    ALT  Date Value Ref Range Status  12/10/2022 6 6 - 29 U/L Final

## 2022-12-28 NOTE — Telephone Encounter (Signed)
Requested Prescriptions  Pending Prescriptions Disp Refills   topiramate (TOPAMAX) 50 MG tablet [Pharmacy Med Name: TOPIRAMATE 50 MG TABS 50 Tablet] 60 tablet 10    Sig: TAKE ONE (1) TABLET BY MOUTH TWICE DAILY     Neurology: Anticonvulsants - topiramate & zonisamide Failed - 12/27/2022  8:24 AM      Failed - AST in normal range and within 360 days    AST  Date Value Ref Range Status  12/10/2022 8 (L) 10 - 35 U/L Final         Failed - Valid encounter within last 12 months    Recent Outpatient Visits           1 year ago Benign essential HTN   [facility] Pickard, Priscille Heidelberg, MD   1 year ago Infectious diarrhea   [facility] Donita Brooks, MD   1 year ago Blood in stool, frank   [facility] Pickard, Priscille Heidelberg, MD   2 years ago 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)   [facility] Donita Brooks, MD   2 years ago Upper respiratory tract infection, unspecified type   [facility] Medicine Valentino Nose, NP              Passed - Cr in normal range and within 360 days    Creat  Date Value Ref Range Status  12/10/2022 0.96 0.50 - 1.03 mg/dL Final   Creatinine, Urine  Date Value Ref Range Status  05/31/2022 203 mg/dL Final    Comment:    Performed at [facility], [address] [place], [place] [address]         Passed - CO2 in normal range and within 360 days    CO2  Date Value Ref Range Status  12/10/2022 25 20 - 32 mmol/L Final   Bicarbonate  Date Value Ref Range Status  07/06/2022 27.2 20.0 - 28.0 mmol/L Final         Passed - ALT in normal range and within 360 days    ALT  Date Value Ref Range Status  12/10/2022 6 6 - 29 U/L Final         Passed - Completed PHQ-2 or PHQ-9 in the last 360 days       levocetirizine (XYZAL) 5 MG tablet [Pharmacy Med Name: LEVOCETIRIZINE 5MG  TAB 5 Tablet] 30 tablet 10    Sig: TAKE 1 TABLET BY MOUTH EVERY EVENING      Ear, Nose, and Throat:  Antihistamines - levocetirizine dihydrochloride Failed - 12/27/2022  8:24 AM      Failed - Valid encounter within last 12 months    Recent Outpatient Visits           1 year ago Benign essential HTN   [facility] Pickard, Priscille Heidelberg, MD   1 year ago Infectious diarrhea   [facility] Tanya Nones, Priscille Heidelberg, MD   1 year ago Blood in stool, frank   [facility] Medicine Donita Brooks, MD   2 years ago 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 [place], [place] [address]         Passed - eGFR is 10 or above and within 360 days    GFR, Est African American  Date Value Ref Range Status  06/27/2020 119 > OR = 60 mL/min/1.57m2 Final   GFR, Est Non African American  Date Value Ref Range Status  06/27/2020 102 > OR = 60 mL/min/1.73m2 Final   GFR, Estimated  Date Value Ref Range Status  07/22/2022 49 (L) >60 mL/min Final    Comment:    (NOTE) Calculated using the CKD-EPI Creatinine Equation (2021)    eGFR  Date Value Ref Range Status  12/10/2022 68 > OR = 60 mL/min/1.36m2 Final          pantoprazole (PROTONIX) 40 MG tablet [Pharmacy Med Name: PANTOPRAZOLE DR 40MG  TAB 40 Tablet] 60 tablet 10    Sig: TAKE ONE (1) TABLET BY MOUTH TWICE DAILY     Gastroenterology: Proton Pump Inhibitors Failed - 12/27/2022  8:24 AM      Failed - Valid encounter within last 12 months    Recent Outpatient Visits           1 year ago Benign essential HTN   [facility]  Family [facility] Pickard, Priscille Heidelberg, MD   1 year ago Infectious diarrhea   [facility] Medicine Tanya Nones, Priscille Heidelberg, MD   1 year ago Blood in stool, frank   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)   [facility] Medicine Tanya Nones, Priscille Heidelberg, MD   2 years ago Upper respiratory tract infection, unspecified type   [facility] Medicine Valentino Nose, NP              Refused Prescriptions Disp Refills   famotidine (PEPCID) 40 MG tablet [Pharmacy Med Name: FAMOTIDINE 40 MG TABLET 40 Tablet] 30 tablet 10    Sig: TAKE 1 TABLET BY MOUTH EVERY DAY AT BEDTIME     Gastroenterology:  H2 Antagonists Failed - 12/27/2022  8:24 AM      Failed - Valid encounter within last 12 months    Recent Outpatient Visits           1 year ago Benign essential HTN   [facility] Pickard, Priscille Heidelberg, MD   1 year ago Infectious diarrhea   [facility] Tanya Nones, Priscille Heidelberg, MD   1 year ago Blood in stool, frank   [facility] Tanya Nones, Priscille Heidelberg, MD   2 years ago 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)   [facility] Family Medicine Tanya Nones, Priscille Heidelberg, MD   2 years ago Upper respiratory tract infection, unspecified type   [facility] Medicine Cathlean Marseilles A, NP               solifenacin (VESICARE) 10 MG tablet [Pharmacy Med Name: SOLIFENACIN 10MG  TAB 10 Tablet] 30 tablet 10    Sig: TAKE 1 TABLET BY MOUTH ONCE DAILY     Urology:  Bladder Agents 2 Failed - 12/27/2022  8:24 AM      Failed - AST  in normal range and within 360 days    AST  Date Value Ref Range Status  12/10/2022 8 (L) 10 - 35 U/L Final         Failed - Valid encounter within last 12 months    Recent Outpatient Visits           1 year ago Benign essential HTN   [facility] Medicine Pickard, Priscille Heidelberg, MD   1 year ago Infectious diarrhea   [facility] Medicine  Tanya Nones, Priscille Heidelberg, MD   1 year ago Blood in stool, frank   [facility] Medicine Donita Brooks, MD   2 years ago 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, [address] [place], [place] [address]         Passed - ALT in normal range and within 360 days    ALT  Date Value Ref Range Status  12/10/2022 6 6 - 29 U/L Final

## 2022-12-29 ENCOUNTER — Ambulatory Visit: Payer: Self-pay | Admitting: *Deleted

## 2022-12-29 NOTE — Patient Outreach (Signed)
Care Coordination   Follow Up Visit Note   12/30/2022 Name: [name] [name] MRN: [id number] DOB: 29-Feb-1966  [name] [name] is a 57 y.o. year old female who sees Pickard, Priscille Heidelberg, MD for primary care. I spoke with  [name] by phone today.  What matters to the patients health and wellness today?  Right stump Wound, Transportation in [place] to wound center, Equipment for mobility while living with her son.  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 She reports it is draining, painful. She reports the pain is tolerable and she does take medicine as needed. She saw her podiatrist, B Evans on 12/24/22 who request she keep it clean, dry and apply silvadene ointment, She was referred to the [place] wound care center.   Transportation Patient had been living with her mother [place] until recently when her mother had a hemorrhagic stroke and now remains hospitalized. Her mother will have to be hospitalized for a while as she has speech problems & hallucinations. Her mother provided transportation and care for her. She reports not being able to get back to nor in her mother's home. Patient is now living with her son in [place] in a trailer with no access to transportation per patient. She is aware of her  [facility] [facility]) transportation benefits. She confirms she has only 15 round trips left for the year and anticipates using these up with wound care visits.   She voiced understanding of her transportation options to include probable use of her medicaid transportation & the [employer] transportation services.    durable medical equipment (DME)/mobility no falls, hard to walk without her walker that is at her mothers home in [place],  [place]  She reports using her cane to get to her son's trailer and he helps her to get up the five steps leading to the entry of the home. She  is not eligible for Medicare assist with new equipment cost as she obtained her items less than 3 years She voiced understanding of this and will take the suggestion to have her son go to the local [employer] or [employer] to obtain spare walker and other needed equipment   Goals Addressed             This Visit's Progress    Home management of Congestive heart failure/infected stump ulcer- [facility] care coordination services       Interventions Today    Flowsheet Row Most Recent Value  Chronic Disease   Chronic disease during today's visit Other  [Right stump foot wound, transportation to [facility] center as she is now living with her son, Does not have access to her DME as it is at her mother's home]  General Interventions   General Interventions Discussed/Reviewed General Interventions Reviewed, Annual Foot Exam, Durable Medical Equipment (DME), [employer], Doctor Visits  [discussed her transportation resources via her united healthcare plan, medicaid and [employer] transportation]  Doctor Visits Discussed/Reviewed Doctor Visits Reviewed, Database administrator (DME) Environmental consultant, Wheelchair, Other  [cane]  Wheelchair Standard  PCP/Specialist Visits Compliance with follow-up visit  Exercise Interventions   Exercise Discussed/Reviewed Physical Activity, Assistive device use and maintanence  [Encouraged checking with a local [employer] or [employer] close to her son home to get spare walker and other equipment]  Education Interventions   Education Provided Provided Education  [united health  care transportation benefit, Hotel manager, medicaid transportation Discussed medicare guidelines to get equipment within 3 years and possible spare equipment for local [employer] or [employer]]  Provided Verbal Education On Foot Care, [employer], Development worker, community  Mental Health Interventions   Mental Health Discussed/Reviewed Mental  Health Reviewed, Coping Strategies              SDOH assessments and interventions completed:  No     Care Coordination Interventions:  Yes, provided   Follow up plan: Follow up call scheduled for 01/04/23    Encounter Outcome:  Patient Visit Completed   [name], RN, BSN, CCM, Care Management Coordinator [phone]

## 2022-12-30 NOTE — Patient Instructions (Signed)
Visit Information  Thank you for taking time to visit with me today. Please don't hesitate to contact me if I can be of assistance to you.   Following are the goals we discussed today:   Goals Addressed             This Visit's Progress    Home management of Congestive heart failure/infected stump ulcer- [facility] care coordination services       Interventions Today    Flowsheet Row Most Recent Value  Chronic Disease   Chronic disease during today's visit Other  [Right stump foot wound, transportation to [facility] wound care center as she is now living with her son, Does not have access to her DME as it is at her mother's home]  General Interventions   General Interventions Discussed/Reviewed General Interventions Reviewed, Annual Foot Exam, Durable Medical Equipment (DME), [employer], Doctor Visits  [discussed her transportation resources via her united healthcare plan, medicaid and [employer] transportation]  Doctor Visits Discussed/Reviewed Doctor Visits Reviewed, Database administrator (DME) Environmental consultant, Biomedical scientist, Other  [cane]  Wheelchair Standard  PCP/Specialist Visits Compliance with follow-up visit  Exercise Interventions   Exercise Discussed/Reviewed Physical Activity, Assistive device use and maintanence  [Encouraged checking with a local [employer] or [employer] close to her son home to get spare walker and other equipment]  Education Interventions   Education Provided Provided Education  [united health care transportation benefit, [employer] senior transportation, medicaid transportation Discussed medicare guidelines to get equipment within 3 years and possible spare equipment for local [employer] or [employer]]  Provided Verbal Education On [employer], [employer], [employer] Plans  Mental Health Interventions   Mental Health Discussed/Reviewed Mental Health Reviewed, Coping Strategies              Our next appointment is by  telephone on 01/04/23 at 3:15 pm  Please call the care guide team at [phone] if you need to cancel or reschedule your appointment.   If you are experiencing a Mental Health or Behavioral Health Crisis or need someone to talk to, please call the Suicide and Crisis Lifeline: 988 call the [place] National Suicide Prevention Lifeline: (705)572-6171 or TTY: 607-843-7157 TTY 458-219-3499) to talk to a trained counselor call 1-800-273-TALK (toll free, 24 hour hotline) call 911 Go to or call [place] behavioral services    Patient verbalizes understanding of instructions and care plan provided today and agrees to view in [place]. Active MyChart status and patient understanding of how to access instructions and care plan via MyChart confirmed with patient.     The patient has been provided with contact information for the care management team and has been advised to call with any health related questions or concerns.   Leean Amezcua L. Noelle Penner, RN, BSN, CCM, Care Management Coordinator [phone]

## 2023-01-04 ENCOUNTER — Ambulatory Visit: Payer: Self-pay | Admitting: *Deleted

## 2023-01-04 NOTE — Patient Instructions (Addendum)
 Visit Information  Thank you for taking time to visit with me today. Please don't hesitate to contact me if I can be of assistance to you.   [place] transportation authority ([employer]) - [phone] Medicaid [id number]  [phone] LINK bus system to store [phone] Para-transit service application, MD 21 day process schedule 24 in advance $1  7351 PARTS [phone]  Please transfers her [place] medicaid to [employer] [place]   Following are the goals we discussed today:   Goals Addressed             This Visit's Progress    Home management of home health services, Congestive heart failure/infected stump ulcer- care coordination services   On track    Interventions Today    Flowsheet Row Most Recent Value  Chronic Disease   Chronic disease during today's visit Diabetes  General Interventions   General Interventions Discussed/Reviewed General Interventions Reviewed, Durable Medical Equipment (DME), [facility], Doctor Visits, Communication with  Doctor Visits Discussed/Reviewed Doctor Visits Reviewed, Specialist  Durable Medical Equipment (DME) Other, Environmental consultant, Psychologist, forensic  [She was able to get her equipment from her mother's home]  Communication with PCP/Specialists  [place] with patient to [employer] medicaid transportation, [place] transportation, [place], Discussed PARTs services near her son address Entered all resource numbers in patient my shart instructions]  [employer], Other  [Assisted with having [place] Para transit staff review their services and mail an application for patient. Patien was encouraged to call [place] DSS for transfer her medicaid to [place]]  Education Interventions   Education Provided Provided Education  Provided Verbal Education On Applications, Medication, [employer], [employer], Other  [Assisted with having  county [place] Para transit staff  review their services and mail an application for patient. Patien was encouraged to call [place] DSS for transfer her medicaid to ]  Mental Health Interventions   Mental Health Discussed/Reviewed Mental Health Reviewed, Coping Strategies  [encouragement provided]              Our next appointment is by telephone on 01/18/23 at 3:15 pm   Please call the care guide team at [phone] if you need to cancel or reschedule your appointment.   If you are experiencing a Mental Health or Behavioral Health Crisis or need someone to talk to, please call the Suicide and Crisis Lifeline: 988 call the [place] National Suicide Prevention Lifeline: (223)548-3693 or TTY: 540-416-2804 TTY 586-632-3871) to talk to a trained counselor call 1-800-273-TALK (toll free, 24 hour hotline) call 911   Patient verbalizes understanding of instructions and care plan provided today and agrees to view in MyChart. Active MyChart status and patient understanding of how to access instructions and care plan via MyChart confirmed with patient.     The patient has been provided with contact information for the care management team and has been advised to call with any health related questions or concerns.   Jaxn Chiquito L. Noelle Penner, RN, BSN, CCM, Care Management Coordinator [phone]

## 2023-01-06 DIAGNOSIS — K5903 Drug induced constipation: Secondary | ICD-10-CM | POA: Diagnosis not present

## 2023-01-06 DIAGNOSIS — M25559 Pain in unspecified hip: Secondary | ICD-10-CM | POA: Diagnosis not present

## 2023-01-06 DIAGNOSIS — G8929 Other chronic pain: Secondary | ICD-10-CM | POA: Diagnosis not present

## 2023-01-06 DIAGNOSIS — Z79891 Long term (current) use of opiate analgesic: Secondary | ICD-10-CM | POA: Diagnosis not present

## 2023-01-06 DIAGNOSIS — M25519 Pain in unspecified shoulder: Secondary | ICD-10-CM | POA: Diagnosis not present

## 2023-01-06 DIAGNOSIS — S91301A Unspecified open wound, right foot, initial encounter: Secondary | ICD-10-CM | POA: Diagnosis not present

## 2023-01-12 ENCOUNTER — Other Ambulatory Visit: Payer: Self-pay | Admitting: Family Medicine

## 2023-01-12 DIAGNOSIS — R0602 Shortness of breath: Secondary | ICD-10-CM

## 2023-01-12 DIAGNOSIS — D649 Anemia, unspecified: Secondary | ICD-10-CM

## 2023-01-13 ENCOUNTER — Encounter: Payer: 59 | Attending: Physician Assistant | Admitting: Physician Assistant

## 2023-01-13 DIAGNOSIS — G603 Idiopathic progressive neuropathy: Secondary | ICD-10-CM | POA: Insufficient documentation

## 2023-01-13 DIAGNOSIS — I130 Hypertensive heart and chronic kidney disease with heart failure and stage 1 through stage 4 chronic kidney disease, or unspecified chronic kidney disease: Secondary | ICD-10-CM | POA: Diagnosis not present

## 2023-01-13 DIAGNOSIS — R7303 Prediabetes: Secondary | ICD-10-CM | POA: Insufficient documentation

## 2023-01-13 DIAGNOSIS — I5042 Chronic combined systolic (congestive) and diastolic (congestive) heart failure: Secondary | ICD-10-CM | POA: Diagnosis not present

## 2023-01-13 DIAGNOSIS — N183 Chronic kidney disease, stage 3 unspecified: Secondary | ICD-10-CM | POA: Diagnosis not present

## 2023-01-13 DIAGNOSIS — E11621 Type 2 diabetes mellitus with foot ulcer: Secondary | ICD-10-CM | POA: Diagnosis not present

## 2023-01-13 DIAGNOSIS — L97512 Non-pressure chronic ulcer of other part of right foot with fat layer exposed: Secondary | ICD-10-CM | POA: Diagnosis not present

## 2023-01-13 NOTE — Telephone Encounter (Signed)
OV 12/10/22 Requested Prescriptions  Pending Prescriptions Disp Refills   FEROSUL 325 (65 Fe) MG tablet [Pharmacy Med Name: FERROUS SULF 325MG  TAB GRN 325 (65 FE) Tablet] 60 tablet 10    Sig: TAKE 1 TABLET BY MOUTH AT BREAKFAST AND AT BEDTIME     Endocrinology:  Minerals - Iron Supplementation Failed - 01/12/2023  7:49 PM      Failed - HGB in normal range and within 360 days    Hemoglobin  Date Value Ref Range Status  12/10/2022 10.7 (L) 11.7 - 15.5 g/dL Final         Failed - HCT in normal range and within 360 days    HCT  Date Value Ref Range Status  12/10/2022 32.4 (L) 35.0 - 45.0 % Final         Failed - RBC in normal range and within 360 days    RBC  Date Value Ref Range Status  12/10/2022 3.44 (L) 3.80 - 5.10 Million/uL Final         Failed - Valid encounter within last 12 months    Recent Outpatient Visits           1 year ago Benign essential HTN   [facility] Pickard, Priscille Heidelberg, MD   1 year ago Infectious diarrhea   [facility] Tanya Nones, Priscille Heidelberg, MD   1 year ago Blood in stool, frank   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

## 2023-01-14 DIAGNOSIS — M25552 Pain in left hip: Secondary | ICD-10-CM | POA: Diagnosis not present

## 2023-01-14 DIAGNOSIS — M25511 Pain in right shoulder: Secondary | ICD-10-CM | POA: Diagnosis not present

## 2023-01-14 DIAGNOSIS — M25561 Pain in right knee: Secondary | ICD-10-CM | POA: Diagnosis not present

## 2023-01-14 DIAGNOSIS — M961 Postlaminectomy syndrome, not elsewhere classified: Secondary | ICD-10-CM | POA: Diagnosis not present

## 2023-01-14 NOTE — Progress Notes (Signed)
Multi-Disciplinary Care Plan Details Patient Name: Date of Service: HILA, BOLDING 01/13/2023 2:00 PM Medical Record Number: [id number] Patient Account Number: [id number] Date of Birth/Sex: Treating RN: 1965/06/08 (57 y.o. [name] Primary Care Zeya Balles: Lynnea Ferrier Other Clinician: Referring Daziyah Cogan: Treating Alexsia Klindt/Extender: Baxter Kail Weeks in Treatment: 0 Active Inactive Necrotic Tissue KAHDIJAH, ERRICKSON ([id number]) [id number] Page 6 of 9 Nursing Diagnoses: Impaired tissue integrity related to necrotic/devitalized tissue Knowledge deficit related to management of necrotic/devitalized tissue Goals: Necrotic/devitalized tissue will be minimized in the wound bed Date Initiated: 01/13/2023 Target Resolution Date: 02/12/2023 Goal Status:  Active Patient/caregiver will verbalize understanding of reason and process for debridement of necrotic tissue Date Initiated: 01/13/2023 Target Resolution Date: 02/12/2023 Goal Status: Active Interventions: Assess patient pain level pre-, during and post procedure and prior to discharge Provide education on necrotic tissue and debridement process Treatment Activities: Apply topical anesthetic as ordered : 01/13/2023 Excisional debridement : 01/13/2023 Notes: Orientation to the Wound Care Program Nursing Diagnoses: Knowledge deficit related to the wound healing center program Goals: Patient/caregiver will verbalize understanding of the Wound Healing Center Program Date Initiated: 01/13/2023 Target Resolution Date: 01/20/2023 Goal Status: Active Interventions: Provide education on orientation to the wound center Notes: Wound/Skin Impairment Nursing Diagnoses: Impaired tissue integrity Knowledge deficit related to ulceration/compromised skin integrity Goals: Patient/caregiver will verbalize understanding of skin care regimen Date Initiated: 01/13/2023 Target Resolution Date: 02/12/2023 Goal Status: Active Ulcer/skin breakdown will have a volume reduction of 30% by week 4 Date Initiated: 01/13/2023 Target Resolution Date: 02/12/2023 Goal Status: Active Ulcer/skin breakdown will have a volume reduction of 50% by week 8 Date Initiated: 01/13/2023 Target Resolution Date: 03/15/2023 Goal Status: Active Ulcer/skin breakdown will have a volume reduction of 80% by week 12 Date Initiated: 01/13/2023 Target Resolution Date: 04/14/2023 Goal Status: Active Interventions: Assess patient/caregiver ability to obtain necessary supplies Assess patient/caregiver ability to perform ulcer/skin care regimen upon admission and as needed Assess ulceration(s) every visit Provide education on ulcer and skin care Treatment Activities: Referred to DME Tonea Leiphart for dressing supplies :  01/13/2023 Notes: Electronic Signature(s) Signed: 01/13/2023 3:40:49 PM By: Midge Aver MSN RN CNS WTA Previous Signature: 01/13/2023 3:40:44 PM Version By: Midge Aver MSN RN CNS WTA Entered By: Midge Aver on 01/13/2023 12:40:49 [name] D ([id number]) [id number] Page 7 of 9 -------------------------------------------------------------------------------- Pain Assessment Details Patient Name: Date of Service: JASHLEY, YELLIN 01/13/2023 2:00 PM Medical Record Number: [id number] Patient Account Number: [id number] Date of Birth/Sex: Treating RN: [birth year] (57 y.o. [name] Primary Care Amaranta Mehl: Lynnea Ferrier Other Clinician: Referring Tynesia Harral: Treating Wilian Kwong/Extender: Baxter Kail Weeks in Treatment: 0 Active Problems Location of Pain Severity and Description of Pain Patient Has Paino No Site Locations Pain Management and Medication Current Pain Management: Electronic Signature(s) Signed: 01/14/2023 12:30:42 PM By: Midge Aver MSN RN CNS WTA Entered By: Midge Aver on 01/13/2023 11:02:39 -------------------------------------------------------------------------------- Patient/Caregiver Education Details Patient Name: Date of Service: [name] 9/26/2024andnbsp2:00 PM Medical Record Number: [id number] Patient Account Number: [id number] Date of Birth/Gender: Treating RN: [birth year] (57 y.o. [name] Primary Care Physician: Lynnea Ferrier Other Clinician: Referring Physician: Treating Physician/Extender: Baxter Kail Weeks in Treatment: 0 Welton, Martha D ([id number]) [id number] Page 8 of 9 Education Assessment Education Provided To: Patient Education Topics Provided Wound Debridement: Handouts: Wound Debridement Methods: Explain/Verbal Responses: State content correctly Wound/Skin Impairment: Handouts: Caring for Your Ulcer Methods:  Explain/Verbal Responses: State content correctly Electronic Signature(s) Signed: 01/14/2023 12:30:42 PM By: Midge Aver MSN RN CNS  ABI) - do not check if billed separately Has the patient been seen at the hospital within the last three years: Yes Total Score: 100 Level Of Care: New/Established - Level 3 Electronic Signature(s) Signed: 01/14/2023 12:30:42 PM By: Midge Aver MSN RN CNS WTA Entered By: Midge Aver on 01/13/2023 12:45:12 -------------------------------------------------------------------------------- Encounter Discharge Information Details Patient Name: Date of Service: [name] 01/13/2023 2:00 PM Medical Record Number: [id number] Patient Account Number: [id number] Date of Birth/Sex: Treating RN: [birth year] (57 y.o. [name] Primary Care Julien Oscar: Lynnea Ferrier Other Clinician: Referring Nuriyah Hanline: Treating Jin Shockley/Extender: Florentina Jenny in Treatment: 0 Encounter Discharge Information Items Post Procedure Vitals Discharge Condition: Stable Temperature (F): 97.9 Ambulatory Status: Walker Pulse (bpm): 90 Discharge Destination: Home Respiratory Rate (breaths/min): 18 Transportation: Other Blood Pressure (mmHg): 149/80 Accompanied By: son Schedule Follow-up Appointment: Yes Clinical Summary of Care: Electronic Signature(s) Signed: 01/13/2023 3:46:11 PM By: Midge Aver MSN RN CNS WTA Entered By: Midge Aver on 01/13/2023 12:46:11 Lower Extremity Assessment Details -------------------------------------------------------------------------------- [name] ([id number]) [id number] Page 4 of 9 Patient Name: Date of Service: LYLIANA, DICENSO 01/13/2023 2:00 PM Medical Record Number: [id number] Patient Account Number: [id number] Date of Birth/Sex: Treating RN: January 17, 1966 (57 y.o. [name] Primary Care Aryam Zhan: Lynnea Ferrier Other Clinician: Referring Daruis Swaim: Treating Jhett Fretwell/Extender: Baxter Kail Weeks in Treatment: 0 Edema Assessment Left: Right: Assessed: No No Edema: Calf Left: Right: Point of Measurement: 31 cm From Medial Instep 40.3 cm Ankle Left: Right: Point of Measurement: 10 cm From Medial Instep 24 cm Knee To Floor Left: Right: From Medial Instep 36 cm Vascular Assessment Left: Right: Pulses: Dorsalis Pedis Palpable: Yes Yes Doppler Audible: Yes Yes Extremity colors, hair growth, and conditions: Extremity Color: Normal Hair Growth on Extremity: No Temperature of Extremity: Warm Capillary Refill: < 3 seconds Dependent Rubor: No Blanched when Elevated: No Lipodermatosclerosis: No Blood Pressure: Brachial: 149 149 Ankle: Dorsalis Pedis: 170 Dorsalis Pedis: 150 Ankle Brachial Index: 1.14 1.01 Electronic Signature(s) Signed: 01/14/2023 12:30:42 PM By: Midge Aver MSN RN CNS WTA Entered By: Midge Aver on 01/13/2023 11:25:11 -------------------------------------------------------------------------------- Multi Wound Chart Details Patient Name: Date of Service: [name] 01/13/2023 2:00 PM Medical Record Number: [id number] Patient Account Number: [id number] Date of Birth/Sex: Treating RN: 1966/02/20 (57 y.o. [name] Primary Care Nancy Manuele: Lynnea Ferrier Other Clinician: Referring Priest Lockridge: Treating Randal Yepiz/Extender: Baxter Kail Weeks in Treatment: 0 Vital Signs Height(in): 64 Pulse(bpm): 90 Weight(lbs): 235 Blood Pressure(mmHg): 149/80 DEVAN, BABINO ([id number]) [id number] Page 5 of 9 Body Mass Index(BMI): 40.3 Temperature(F): 97.9 Respiratory Rate(breaths/min): 18 [1:Photos:] [N/A:N/A] Right,  Plantar, Posterior Foot N/A N/A Wound Location: Gradually Appeared N/A N/A Wounding Event: Atypical N/A N/A Primary Etiology: Asthma, Sleep Apnea, Congestive N/A N/A Comorbid History: Heart Failure, Hypertension, Seizure Disorder 12/06/2022 N/A N/A Date Acquired: 0 N/A N/A Weeks of Treatment: Open N/A N/A Wound Status: No N/A N/A Wound Recurrence: 0.7x0.7x0.2 N/A N/A Measurements L x W x D (cm) 0.385 N/A N/A A (cm) : rea 0.077 N/A N/A Volume (cm) : Full Thickness Without Exposed N/A N/A Classification: Support Structures Medium N/A N/A Exudate Amount: Serosanguineous N/A N/A Exudate Type: red, brown N/A N/A Exudate Color: Medium (34-66%) N/A N/A Granulation Amount: Red, Pink N/A N/A Granulation Quality: Fat Layer (Subcutaneous Tissue): Yes N/A N/A Exposed Structures: Fascia: No Tendon: No Muscle: No Joint: No Bone: No Treatment Notes Electronic Signature(s) Signed: 01/14/2023 12:30:42 PM By: Midge Aver MSN RN CNS WTA Entered By: Midge Aver on 01/13/2023 11:50:25 --------------------------------------------------------------------------------  WTA Entered By: Midge Aver on 01/13/2023 12:41:13 -------------------------------------------------------------------------------- Wound Assessment Details Patient Name: Date of Service: JANETH, TERRY 01/13/2023 2:00 PM Medical Record Number: [id number] Patient Account Number: [id number] Date of Birth/Sex: Treating RN: [birth year] (57 y.o. [name] Primary Care Jehad Bisono: Lynnea Ferrier Other Clinician: Referring Jaqulyn Chancellor: Treating Timithy Arons/Extender: Baxter Kail Weeks in Treatment: 0 Wound Status Wound Number: 1 Primary Atypical Etiology: Wound Location: Right, Plantar, Posterior Foot Wound Open Wounding Event: Gradually Appeared Status: Date Acquired: 12/06/2022 Comorbid Asthma, Sleep Apnea, Congestive Heart Failure, Hypertension, Weeks Of Treatment: 0 History: Seizure Disorder Clustered Wound: No Photos Wound Measurements Length: (cm) 0.7 Width: (cm) 0.7 Depth: (cm) 0.2 Area: (cm) 0.385 Volume: (cm) 0.077 Trawick, Tracey D ([id number]) Wound Description Classification: Full Thickness Without Exposed Suppor Exudate Amount: Medium Exudate Type: Serosanguineous Exudate Color: red, brown t Structures Foul Odor After Cleansing: Slough/Fibrino % Reduction in Area: % Reduction in Volume: [id number] Page 9 of 9 No No Wound Bed Granulation Amount: Medium (34-66%) Exposed Structure Granulation Quality: Red, Pink Fascia Exposed: No Fat Layer (Subcutaneous Tissue) Exposed: Yes Tendon Exposed: No Muscle Exposed: No Joint Exposed: No Bone Exposed: No Electronic Signature(s) Signed: 01/14/2023 12:30:42 PM By: Midge Aver MSN RN CNS WTA Entered By: Midge Aver on 01/13/2023 11:25:01 -------------------------------------------------------------------------------- Vitals Details Patient Name:  Date of Service: [name] 01/13/2023 2:00 PM Medical Record Number: [id number] Patient Account Number: [id number] Date of Birth/Sex: Treating RN: 1965/10/26 (57 y.o. [name] Primary Care Brittnay Pigman: Lynnea Ferrier Other Clinician: Referring Etter Royall: Treating Shenna Brissette/Extender: Baxter Kail Weeks in Treatment: 0 Vital Signs Time Taken: 14:02 Temperature (F): 97.9 Height (in): 64 Pulse (bpm): 90 Source: Stated Respiratory Rate (breaths/min): 18 Weight (lbs): 235 Blood Pressure (mmHg): 149/80 Source: Stated Reference Range: 80 - 120 mg / dl Body Mass Index (BMI): 40.3 Electronic Signature(s) Signed: 01/14/2023 12:30:42 PM By: Midge Aver MSN RN CNS WTA Entered By: Midge Aver on 01/13/2023 11:03:11  CEARA, WRIGHTSON ([id number]) [id number] Page 1 of 9 Visit Report for 01/13/2023 Allergy List Details Patient Name: Date of Service: BLAKLEIGH, STRAW 01/13/2023 2:00 PM Medical Record Number: [id number] Patient Account Number: [id number] Date of Birth/Sex: Treating RN: 31-Dec-1965 (57 y.o. [name] Primary Care Eve Rey: Lynnea Ferrier Other Clinician: Referring Kessler Solly: Treating Rahiem Schellinger/Extender: Baxter Kail Weeks in Treatment: 0 Allergies Active Allergies tetracycline tramadol oxybutynin phenazopyridine ciprofloxacin codeine estradiol Allergy Notes Electronic Signature(s) Signed: 01/14/2023 12:30:42 PM By: Midge Aver MSN RN CNS WTA Entered By: Midge Aver on 01/13/2023 10:48:34 -------------------------------------------------------------------------------- Arrival Information Details Patient Name: Date of Service: [name] 01/13/2023 2:00 PM Medical Record Number: [id number] Patient Account Number: [id number] Date of Birth/Sex: Treating RN: 02-17-1966 (57 y.o. [name] Primary Care Nica Friske: Lynnea Ferrier Other Clinician: Referring Margery Szostak: Treating Fareeda Downard/Extender: Florentina Jenny in Treatment: 0 Visit Information Patient Arrived: [facility] Time: 13:53 KATEE, WENTLAND ([id number]) [id number] Page 2 of 9 Accompanied By: son Transfer Assistance: None Patient Identification Verified: Yes Secondary Verification Process Completed: Yes Patient Requires Transmission-Based Precautions: No Patient Has Alerts: Yes Patient Alerts: Prediabetes Electronic Signature(s) Signed: 01/14/2023 12:30:42 PM By: Midge Aver MSN RN CNS WTA Entered By: Midge Aver on 01/13/2023 11:02:25 -------------------------------------------------------------------------------- Clinic Level of Care Assessment Details Patient Name: Date of Service: CHINWE, LOPE 01/13/2023  2:00 PM Medical Record Number: [id number] Patient Account Number: [id number] Date of Birth/Sex: Treating RN: 05-29-1965 (57 y.o. [name] Primary Care Kinisha Soper: Lynnea Ferrier Other Clinician: Referring Shervin Cypert: Treating Frenchie Pribyl/Extender: Florentina Jenny in Treatment: 0 Clinic Level of Care Assessment Items TOOL 1 Quantity Score X- 1 0 Use when EandM and Procedure is performed on INITIAL visit ASSESSMENTS - Nursing Assessment / Reassessment X- 1 20 General Physical Exam (combine w/ comprehensive assessment (listed just below) when performed on new pt. evals) X- 1 25 Comprehensive Assessment (HX, ROS, Risk Assessments, Wounds Hx, etc.) ASSESSMENTS - Wound and Skin Assessment / Reassessment []  - 0 Dermatologic / Skin Assessment (not related to wound area) ASSESSMENTS - Ostomy and/or Continence Assessment and Care []  - 0 Incontinence Assessment and Management []  - 0 Ostomy Care Assessment and Management (repouching, etc.) PROCESS - Coordination of Care X - Simple Patient / Family Education for ongoing care 1 15 []  - 0 Complex (extensive) Patient / Family Education for ongoing care X- 1 10 Staff obtains Chiropractor, Records, T Results / Process Orders est []  - 0 Staff telephones HHA, Nursing Homes / Clarify orders / etc []  - 0 Routine Transfer to another Facility (non-emergent condition) []  - 0 Routine Hospital Admission (non-emergent condition) X- 1 15 New Admissions / Manufacturing engineer / Ordering NPWT Apligraf, etc. , []  - 0 Emergency Hospital Admission (emergent condition) PROCESS - Special Needs []  - 0 Pediatric / Minor Patient Management []  - 0 Isolation Patient Management []  - 0 Hearing / Language / Visual special needs []  - 0 Assessment of Community assistance (transportation, D/C planning, etc.) LEASHA, GOLDBERGER D ([id number]) [id number] Page 3 of 9 []  - 0 Additional assistance / Altered mentation []  -  0 Support Surface(s) Assessment (bed, cushion, seat, etc.) INTERVENTIONS - Miscellaneous []  - 0 External ear exam []  - 0 Patient Transfer (multiple staff / Nurse, adult / Similar devices) []  - 0 Simple Staple / Suture removal (25 or less) []  - 0 Complex Staple / Suture removal (26 or more) []  - 0 Hypo/Hyperglycemic Management (do not check if billed separately) X- 1 15 Ankle / Brachial Index (

## 2023-01-14 NOTE — Progress Notes (Signed)
VINESSA, MACCONNELL ([id number]) [phone] [id number] Page 1 of 5 Visit Report for 01/13/2023 Abuse Risk Screen Details Patient Name: Date of Service: [name] [name], [name] [name] 01/13/2023 2:00 PM Medical Record Number: [id number] Patient Account Number: [id number] Date of Birth/Sex: Treating RN: Feb 24, 1966 (57 y.o. [name] Primary Care Kaleth Koy: Lynnea Ferrier Other Clinician: Referring Blayke Cordrey: Treating Sana Tessmer/Extender: Baxter Kail Weeks in Treatment: 0 Abuse Risk Screen Items Answer Electronic Signature(s) Signed: 01/14/2023 12:30:42 PM By: Midge Aver MSN RN CNS WTA Entered By: Midge Aver on 01/13/2023 11:03:57 -------------------------------------------------------------------------------- Activities of Daily Living Details Patient Name: Date of Service: [name] [name], [name] [name] 01/13/2023 2:00 PM Medical Record Number: [id number] Patient Account Number: [id number] Date of Birth/Sex: Treating RN: 1965/06/28 (57 y.o. [name] Primary Care Kardell Virgil: Lynnea Ferrier Other Clinician: Referring Foster Frericks: Treating Haily Caley/Extender: Baxter Kail Weeks in Treatment: 0 Activities of Daily Living Items Answer Activities of Daily Living (Please select one for each item) Drive Automobile Not Able T Medications ake Completely Able Use T elephone Completely Able Care for Appearance Need Assistance Use T oilet Need Assistance Bath / Shower Need Assistance Dress Self Need Assistance Feed Self Completely Able Walk Completely Able Get In / Out Bed Completely Able Housework Need Assistance Prepare Meals Not Able Handle Money Need Assistance Shop for Self Need Assistance GWENDLYON, ZUMBRO ([id number]) [phone] [id number] Page 2 of 5 Electronic Signature(s) Signed: 01/14/2023 12:30:42 PM By: Midge Aver MSN RN CNS WTA Entered By: Midge Aver on 01/13/2023  11:04:57 -------------------------------------------------------------------------------- Education Screening Details Patient Name: Date of Service: [name] Cloud [name]. 01/13/2023 2:00 PM Medical Record Number: [id number] Patient Account Number: [id number] Date of Birth/Sex: Treating RN: October 09, 1965 (57 y.o. [name] Primary Care Timmi Devora: Lynnea Ferrier Other Clinician: Referring Keiarah Orlowski: Treating Helma Argyle/Extender: Florentina Jenny in Treatment: 0 Learning Preferences/Education Level/Primary Language Learning Preference: Explanation, Demonstration Preferred Language: English Cognitive Barrier Language Barrier: No Translator Needed: No Memory Deficit: No Emotional Barrier: No Cultural/Religious Beliefs Affecting Medical Care: No Physical Barrier Impaired Vision: No Impaired Hearing: No Decreased Hand dexterity: No Knowledge/Comprehension Knowledge Level: High Comprehension Level: High Ability to understand written instructions: High Ability to understand verbal instructions: High Motivation Anxiety Level: Calm Cooperation: Cooperative Education Importance: Acknowledges Need Interest in Health Problems: Asks Questions Perception: Coherent Willingness to Engage in Self-Management High Activities: Readiness to Engage in Self-Management High Activities: Electronic Signature(s) Signed: 01/14/2023 12:30:42 PM By: Midge Aver MSN RN CNS WTA Entered By: Midge Aver on 01/13/2023 11:05:17 [name] Cloud [name] ([id number]) [id number] Page 3 of 5 -------------------------------------------------------------------------------- Fall Risk Assessment Details Patient Name: Date of Service: [name] [name], [name] [name] 01/13/2023 2:00 PM Medical Record Number: [id number] Patient Account Number: [id number] Date of Birth/Sex: Treating RN: 12-May-1965 (57 y.o. [name] Primary Care Anagabriela Jokerst: Lynnea Ferrier Other Clinician: Referring  Vaughn Beaumier: Treating Dilpreet Faires/Extender: Florentina Jenny in Treatment: 0 Fall Risk Assessment Items Have you had 2 or more falls in the last 12 monthso 0 No Have you had any fall that resulted in injury in the last 12 monthso 0 No FALLS RISK SCREEN History of falling - immediate or within 3 months 0 No Secondary diagnosis (Do you have 2 or more medical diagnoseso) 0 No Ambulatory aid None/bed rest/wheelchair/nurse 0 No Crutches/cane/walker 15 Yes Furniture 0 No Intravenous therapy Access/Saline/Heparin Lock 0 No Gait/Transferring Normal/ bed rest/ wheelchair 0 No Weak (short steps with or without shuffle, stooped but able to lift head while walking, may seek 10 Yes support from furniture) Impaired (short steps with  shuffle, may have difficulty arising from chair, head down, impaired 0 No balance) Mental Status Oriented to own ability 0 Yes Electronic Signature(s) Signed: 01/14/2023 12:30:42 PM By: Midge Aver MSN RN CNS WTA Entered By: Midge Aver on 01/13/2023 11:05:54 -------------------------------------------------------------------------------- Foot Assessment Details Patient Name: Date of Service: [name] Cloud [name]. 01/13/2023 2:00 PM Medical Record Number: [id number] Patient Account Number: [id number] Date of Birth/Sex: Treating RN: 13-Jun-1965 (57 y.o. 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 Site Locations [name] [name], [name] [name] ([id number]) [phone] [id number] Page 4 of 5 + = Sensation present, - = Sensation absent, C = Callus, U = Ulcer R = Redness, W = Warmth, M = Maceration, PU = Pre-ulcerative lesion F = Fissure, S = Swelling, [name] = Dryness Assessment Right: Left: Other Deformity: No Prior Foot Ulcer: No Prior Amputation:  No Charcot Joint: No Ambulatory Status: Ambulatory With Help Assistance Device: Walker Gait[employer]utures trader) Signed: 01/14/2023 12:30:42 PM By: Midge Aver MSN RN CNS WTA Entered By: Midge Aver on 01/13/2023 11:11:39 -------------------------------------------------------------------------------- Nutrition Risk Screening Details Patient Name: Date of Service: [name] Cloud [name]. 01/13/2023 2:00 PM Medical Record Number: [id number] Patient Account Number: [id number] Date of Birth/Sex: Treating RN: June 06, 1965 (57 y.o. [name] Primary Care Donnavin Vandenbrink: Lynnea Ferrier Other Clinician: Referring Austynn Pridmore: Treating Tesia Lybrand/Extender: Baxter Kail Weeks in Treatment: 0 Height (in): 64 Weight (lbs): 235 Body Mass Index (BMI): 40.3 Nutrition Risk Screening Items Score Screening NUTRITION RISK SCREEN: I have an illness or condition that made me change the kind and/or amount of food I eat 0 No I eat fewer than two meals per day 0 No I eat few fruits and vegetables, or milk products 0 No I have three or more drinks of beer, liquor or wine almost every day 0 No I have tooth or mouth problems that make it hard for me to eat 0 No [name] [name], [name] [name] ([id number]) [id number] Page 5 of 5 I don't always have enough money to buy the food I need 0 No I eat alone most of the time 0 No I take three or more different prescribed or over-the-counter drugs a day 1 Yes Without wanting to, I have lost or gained 10 pounds in the last six months 0 No I am not always physically able to shop, cook and/or feed myself 2 Yes Nutrition Protocols Good Risk Protocol 0 No interventions needed Moderate Risk Protocol High Risk Proctocol Risk Level: Moderate Risk Score: 3 Electronic Signature(s) Signed: 01/14/2023 12:30:42 PM By: Midge Aver MSN RN CNS WTA Entered By: Midge Aver on 01/13/2023 11:06:38

## 2023-01-17 DIAGNOSIS — S91209D Unspecified open wound of unspecified toe(s) with damage to nail, subsequent encounter: Secondary | ICD-10-CM | POA: Diagnosis not present

## 2023-01-18 ENCOUNTER — Ambulatory Visit: Payer: Self-pay | Admitting: *Deleted

## 2023-01-18 NOTE — Patient Outreach (Signed)
Care Coordination    Follow Up Visit Note   01/19/2023 Name: [name] [name] MRN: [id number] DOB: Aug 23, 1965  [name] [name] is a 57 y.o. year old female who sees Pickard, Priscille Heidelberg, MD for primary care. I spoke with  [name] by phone today.  What matters to the patients health and wellness today?  Change address back to her mother's home, Her mother returned home < home health PT for left hip (surgery delayed and continue to have pain, Alternate transportation    [address] [place] [address] entered in demographics   Her mother is home and doing well. Patient is thankful to be back home with all her belongs.   Patient inquires about In home physical therapy for left hip surgery delayed for while & continued pain   Alternate transportation- Still has [employer] rides x 13. She was able to attend her recent 01/13/23 MD visit.She inquires if the [employer] [place] Para transit services set up for her while in [place] would be able to assist her in [place] . She received her forms but has not filled them out at this time     Goals Addressed             This Visit's Progress    Home management of Congestive heart failure/infected stump ulcer- [facility] care coordination services       Interventions Today    Flowsheet Row Most Recent Value  Chronic Disease   Chronic disease during today's visit Other  [left hip pain, alternate transportation, in home physical therapy]  General Interventions   General Interventions Discussed/Reviewed General Interventions Reviewed, [facility], Doctor Visits, Communication with  Doctor Visits Discussed/Reviewed Doctor Visits Reviewed, PCP, Specialist  PCP/Specialist Visits Compliance with follow-up visit  Communication with --  [spoke with Tammy Sours at [facility] para transit services  [phone] Patient is nolonger able to use the [place] para transit as her mother's address is in [place]               SDOH assessments and interventions completed:  No     Care Coordination Interventions:  Yes, provided   Follow up plan: Follow up call scheduled for 01/25/23    Encounter Outcome:  Patient Visit Completed

## 2023-01-19 ENCOUNTER — Ambulatory Visit: Payer: Self-pay | Admitting: *Deleted

## 2023-01-19 NOTE — Patient Instructions (Signed)
Visit Information  Thank you for taking time to visit with me today. Please don't hesitate to contact me if I can be of assistance to you.   Following are the goals we discussed today:   Goals Addressed             This Visit's Progress    Home management of Congestive heart failure/infected stump ulcer- [facility] care coordination services       Interventions Today    Flowsheet Row Most Recent Value  Chronic Disease   Chronic disease during today's visit Other  [left hip pain, alternate transportation, in home physical therapy]  General Interventions   General Interventions Discussed/Reviewed General Interventions Reviewed, [facility], Doctor Visits, Communication with  Doctor Visits Discussed/Reviewed Doctor Visits Reviewed, PCP, Specialist  PCP/Specialist Visits Compliance with follow-up visit  Communication with --  [spoke with Tammy Sours at [facility] para transit services  [phone] Patient is nolonger able to use the [place] para transit as her mother's address is in [place]              Our next appointment is by telephone on 01/25/23 at 1100  Please call the care guide team at [phone] if you need to cancel or reschedule your appointment.   If you are experiencing a Mental Health or Behavioral Health Crisis or need someone to talk to, please call the Suicide and Crisis Lifeline: 988 call the [place] National Suicide Prevention Lifeline: 512-619-0349 or TTY: 502-152-5967 TTY (252)169-8261) to talk to a trained counselor call 1-800-273-TALK (toll free, 24 hour hotline) call the [facility]: 402-619-5727 call 911   Patient verbalizes understanding of instructions and care plan provided today and agrees to view in MyChart. Active MyChart status and patient understanding of how to access instructions and care plan via MyChart confirmed with patient.     The patient has been provided with contact information for the care management team and  has been advised to call with any health related questions or concerns.   Payson Crumby L. Noelle Penner, RN, BSN, CCM, Care Management Coordinator [phone]

## 2023-01-19 NOTE — Patient Instructions (Signed)
Visit Information  Thank you for taking time to visit with me today. Please don't hesitate to contact me if I can be of assistance to you.   Following are the goals we discussed today:   Goals Addressed             This Visit's Progress    Home management of medical transportation .  Congestive heart failure/infected stump ulcer- care coordination services       Interventions Today    Flowsheet Row Most Recent Value  Chronic Disease   Chronic disease during today's visit Other  [[place] transit rides]  General Interventions   General Interventions Discussed/Reviewed General Interventions Reviewed, Communication with, Community Resources  PCP/Specialist Visits Compliance with follow-up visit  Communication with PCP/Specialists  [spoke with [place] staff who encourage calling the DSS office. [place] who confirmed the patient will need to call [phone] to complete an assessment prior to scheduling rides + there is not para transit services]              Our next appointment is by telephone on 01/25/23 at 1100  Please call the care guide team at [phone] if you need to cancel or reschedule your appointment.   If you are experiencing a Mental Health or Behavioral Health Crisis or need someone to talk to, please call the Suicide and Crisis Lifeline: 988 call the [place] National Suicide Prevention Lifeline: (980)275-0454 or TTY: 854-703-9568 TTY (952) 086-3056) to talk to a trained counselor call 1-800-273-TALK (toll free, 24 hour hotline) call the [facility]: (269) 057-0379 call 911   Patient verbalizes understanding of instructions and care plan provided today and agrees to view in MyChart. Active MyChart status and patient understanding of how to access instructions and care plan via MyChart confirmed with patient.     The patient has been provided with contact information for the care management team and has been advised to call with  any health related questions or concerns.   Lavell Ridings L. Noelle Penner, RN, BSN, CCM, Care Management Coordinator [phone]

## 2023-01-19 NOTE — Patient Outreach (Signed)
Care Coordination   Collaboration with [facility] DSS transportation services  Visit Note   01/19/2023 Name: [name] [name] MRN: [id number] DOB: 1965-06-07  [name] [name] is a 57 y.o. year old female who sees Pickard, Priscille Heidelberg, MD for primary care. I  spoke with [place] at [facility] transportation at [phone]  What matters to the patients health and wellness today?  [place] rides to medical appointments  Dell Ponto confirmed the patient needed to call to complete via telephone her medical assessment  The patient will be informed    Goals Addressed             This Visit's Progress    Home management of medical transportation .  Congestive heart failure/infected stump ulcer- care coordination services       Interventions Today    Flowsheet Row Most Recent Value  Chronic Disease   Chronic disease during today's visit Other  [[place] transit rides]  General Interventions   General Interventions Discussed/Reviewed General Interventions Reviewed, Communication with, Community Resources  PCP/Specialist Visits Compliance with follow-up visit  Communication with PCP/Specialists  [spoke with [place] staff who encourage calling the DSS office. [place] who confirmed the patient will need to call [phone] to complete an assessment prior to scheduling rides + there is not para transit services]              SDOH assessments and interventions completed:  No     Care Coordination Interventions:  Yes, provided   Follow up plan: Follow up call scheduled for 01/25/23    Encounter Outcome:  Patient Visit Completed   [name], RN, BSN, CCM, Care Management Coordinator [phone]

## 2023-01-20 ENCOUNTER — Encounter: Payer: 59 | Attending: Physician Assistant | Admitting: Physician Assistant

## 2023-01-20 DIAGNOSIS — R7303 Prediabetes: Secondary | ICD-10-CM | POA: Insufficient documentation

## 2023-01-20 DIAGNOSIS — I5042 Chronic combined systolic (congestive) and diastolic (congestive) heart failure: Secondary | ICD-10-CM | POA: Insufficient documentation

## 2023-01-20 DIAGNOSIS — N183 Chronic kidney disease, stage 3 unspecified: Secondary | ICD-10-CM | POA: Diagnosis not present

## 2023-01-20 DIAGNOSIS — L97512 Non-pressure chronic ulcer of other part of right foot with fat layer exposed: Secondary | ICD-10-CM | POA: Diagnosis not present

## 2023-01-20 DIAGNOSIS — I130 Hypertensive heart and chronic kidney disease with heart failure and stage 1 through stage 4 chronic kidney disease, or unspecified chronic kidney disease: Secondary | ICD-10-CM | POA: Diagnosis not present

## 2023-01-20 DIAGNOSIS — G603 Idiopathic progressive neuropathy: Secondary | ICD-10-CM | POA: Diagnosis not present

## 2023-01-21 ENCOUNTER — Other Ambulatory Visit: Payer: Self-pay | Admitting: Family Medicine

## 2023-01-21 DIAGNOSIS — Z1212 Encounter for screening for malignant neoplasm of rectum: Secondary | ICD-10-CM

## 2023-01-21 DIAGNOSIS — Z1211 Encounter for screening for malignant neoplasm of colon: Secondary | ICD-10-CM

## 2023-01-21 NOTE — Progress Notes (Signed)
[name] [name] ([id number]) [id number] Page 1 of 9 Visit Report for 01/20/2023 Arrival Information Details Patient Name: Date of Service: [name] [name], [name] [name] 01/20/2023 3:30 PM Medical Record Number: [id number] Patient Account Number: [id number] Date of Birth/Sex: Treating RN: Mar 14, 1966 (57 y.o. [name] [name] Primary Care [name] [name]: Lynnea Ferrier Other Clinician: Referring [name] [name]: Treating [name] [name]/Extender: [name] [name] in Treatment: 1 Visit Information History Since Last Visit Added or deleted any medications: No Patient Arrived: [name] Any new allergies or adverse reactions: No Arrival Time: 15:40 Has Dressing in Place as Prescribed: Yes Accompanied By: self Pain Present Now: No Transfer Assistance: None Patient Identification Verified: Yes Secondary Verification Process Completed: Yes Patient Requires Transmission-Based Precautions: No Patient Has Alerts: Yes Patient Alerts: Prediabetes Electronic Signature(s) Signed: 01/20/2023 4:49:00 PM By: [name] Aver MSN RN CNS WTA Entered By: [name] [name] on 01/20/2023 16:49:00 -------------------------------------------------------------------------------- Clinic Level of Care Assessment Details Patient Name: Date of Service: [name] [name] 01/20/2023 3:30 PM Medical Record Number: [id number] Patient Account Number: [id number] Date of Birth/Sex: Treating RN: 11-Jan-1966 (57 y.o. [name] [name] Primary Care [name] [name]: Lynnea Ferrier Other Clinician: Referring [name] [name]: Treating [name] [name]/Extender: [name] [name] in Treatment: 1 Clinic Level of Care Assessment Items TOOL 4 Quantity Score X- 1 0 Use when only an EandM is performed on FOLLOW-UP visit ASSESSMENTS - Nursing Assessment / Reassessment X- 1 10 Reassessment of Co-morbidities (includes updates in patient status) X- 1 5 Reassessment of Adherence to Treatment Plan ASSESSMENTS - Wound and Skin A  ssessment / Reassessment X - Simple Wound Assessment / Reassessment - one wound 1 5 [name] [name] ([id number]) [id number] Page 2 of 9 []  - 0 Complex Wound Assessment / Reassessment - multiple wounds []  - 0 Dermatologic / Skin Assessment (not related to wound area) ASSESSMENTS - Focused Assessment []  - 0 Circumferential Edema Measurements - multi extremities []  - 0 Nutritional Assessment / Counseling / Intervention []  - 0 Lower Extremity Assessment (monofilament, tuning fork, pulses) []  - 0 Peripheral Arterial Disease Assessment (using hand held doppler) ASSESSMENTS - Ostomy and/or Continence Assessment and Care []  - 0 Incontinence Assessment and Management []  - 0 Ostomy Care Assessment and Management (repouching, etc.) PROCESS - Coordination of Care X - Simple Patient / Family Education for ongoing care 1 15 []  - 0 Complex (extensive) Patient / Family Education for ongoing care X- 1 10 Staff obtains Chiropractor, Records, T Results / Process Orders est []  - 0 Staff telephones HHA, Nursing Homes / Clarify orders / etc []  - 0 Routine Transfer to another Facility (non-emergent condition) []  - 0 Routine Hospital Admission (non-emergent condition) []  - 0 New Admissions / Manufacturing engineer / Ordering NPWT Apligraf, etc. , []  - 0 Emergency Hospital Admission (emergent condition) X- 1 10 Simple Discharge Coordination []  - 0 Complex (extensive) Discharge Coordination PROCESS - Special Needs []  - 0 Pediatric / Minor Patient Management []  - 0 Isolation Patient Management []  - 0 Hearing / Language / Visual special needs []  - 0 Assessment of Community assistance (transportation, [name]/C planning, etc.) []  - 0 Additional assistance / Altered mentation []  - 0 Support Surface(s) Assessment (bed, cushion, seat, etc.) INTERVENTIONS - Wound Cleansing / Measurement X - Simple Wound Cleansing - one wound 1 5 []  - 0 Complex Wound Cleansing - multiple  wounds X- 1 5 Wound Imaging (photographs - any number of wounds) []  - 0 Wound Tracing (instead of photographs) X- 1 5 Simple Wound Measurement - one wound []  - 0 Complex Wound Measurement -  Treating RN: [birth year] (57 y.o. [name] [name] Primary Care Mackinley Kiehn: Lynnea Ferrier Other Clinician: Referring [name] [name]: Treating [name] [name]/Extender: [name] [name] in Treatment: 1 Wound Status Wound Number: 1 Primary Neuropathic Ulcer-Non  Diabetic Etiology: Wound Location: Right, Plantar, Posterior Foot Wound Open Wounding Event: Gradually Appeared Status: Date Acquired: 12/06/2022 Comorbid Asthma, Sleep Apnea, Congestive Heart Failure, Hypertension, [name] Of Treatment: 1 History: Seizure Disorder Clustered Wound: No Photos [name] [name] ([id number]) [id number] Page 8 of 9 Wound Measurements Length: (cm) 0.7 Width: (cm) 0.7 Depth: (cm) 0.1 Area: (cm) 0.385 Volume: (cm) 0.038 % Reduction in Area: 0% % Reduction in Volume: 50.6% Wound Description Classification: Full Thickness Without Exposed Support Structures Exudate Amount: Medium Exudate Type: Serosanguineous Exudate Color: red, brown Foul Odor After Cleansing: No Slough/Fibrino No Wound Bed Granulation Amount: Medium (34-66%) Exposed Structure Granulation Quality: Red, Pink Fascia Exposed: No Fat Layer (Subcutaneous Tissue) Exposed: Yes Tendon Exposed: No Muscle Exposed: No Joint Exposed: No Bone Exposed: No Treatment Notes Wound #1 (Foot) Wound Laterality: Plantar, Right, Posterior Cleanser [employer]ram Ancillary Kit - 15 Day Supply Discharge Instruction: Use supplies as instructed; Kit contains: (15) Saline Bullets; (15) 3x3 Gauze; 15 pr Gloves Soap and Water Discharge Instruction: Gently cleanse wound with antibacterial soap, rinse and pat dry prior to dressing wounds Peri-Wound Care Topical Primary Dressing Hydrofera Blue Ready Transfer Foam, 2.5x2.5 (in/in) Discharge Instruction: Apply Hydrofera Blue Ready to wound bed as directed Secondary Dressing ABD Pad 5x9 (in/in) Discharge Instruction: Cover with ABD pad Secured With Medipore T - 25M Medipore H Soft Cloth Surgical T ape ape, 2x2 (in/yd) Kerlix Roll Sterile or Non-Sterile 6-ply 4.5x4 (yd/yd) Discharge Instruction: Apply Kerlix as directed Compression Wrap Compression Stockings Add-Ons Electronic Signature(s) Signed: 01/21/2023 1:36:59 PM By: [name] Aver MSN  RN CNS WTA Entered By: [name] [name] on 01/20/2023 15:50:27 [name] [name] ([id number]) [id number] Page 9 of 9 -------------------------------------------------------------------------------- Vitals Details Patient Name: Date of Service: [name] [name], [name] [name] 01/20/2023 3:30 PM Medical Record Number: [id number] Patient Account Number: [id number] Date of Birth/Sex: Treating RN: [birth year] (57 y.o. [name] [name] Primary Care Tremond Shimabukuro: Lynnea Ferrier Other Clinician: Referring Ramiyah Mcclenahan: Treating Zebulen Simonis/Extender: [name] [name] in Treatment: 1 Vital Signs Time Taken: 15:40 Temperature (F): 98.0 Height (in): 64 Pulse (bpm): 86 Weight (lbs): 235 Respiratory Rate (breaths/min): 18 Body Mass Index (BMI): 40.3 Blood Pressure (mmHg): 135/78 Reference Range: 80 - 120 mg / dl Electronic Signature(s) Signed: 01/20/2023 4:49:04 PM By: [name] Aver MSN RN CNS WTA Entered By: [name] [name] on 01/20/2023 16:49:03  [name] [name] ([id number]) [id number] Page 1 of 9 Visit Report for 01/20/2023 Arrival Information Details Patient Name: Date of Service: [name] [name], [name] [name] 01/20/2023 3:30 PM Medical Record Number: [id number] Patient Account Number: [id number] Date of Birth/Sex: Treating RN: Mar 14, 1966 (57 y.o. [name] [name] Primary Care [name] [name]: Lynnea Ferrier Other Clinician: Referring [name] [name]: Treating [name] [name]/Extender: [name] [name] in Treatment: 1 Visit Information History Since Last Visit Added or deleted any medications: No Patient Arrived: [name] Any new allergies or adverse reactions: No Arrival Time: 15:40 Has Dressing in Place as Prescribed: Yes Accompanied By: self Pain Present Now: No Transfer Assistance: None Patient Identification Verified: Yes Secondary Verification Process Completed: Yes Patient Requires Transmission-Based Precautions: No Patient Has Alerts: Yes Patient Alerts: Prediabetes Electronic Signature(s) Signed: 01/20/2023 4:49:00 PM By: [name] Aver MSN RN CNS WTA Entered By: [name] [name] on 01/20/2023 16:49:00 -------------------------------------------------------------------------------- Clinic Level of Care Assessment Details Patient Name: Date of Service: [name] [name] 01/20/2023 3:30 PM Medical Record Number: [id number] Patient Account Number: [id number] Date of Birth/Sex: Treating RN: 11-Jan-1966 (57 y.o. [name] [name] Primary Care [name] [name]: Lynnea Ferrier Other Clinician: Referring [name] [name]: Treating [name] [name]/Extender: [name] [name] in Treatment: 1 Clinic Level of Care Assessment Items TOOL 4 Quantity Score X- 1 0 Use when only an EandM is performed on FOLLOW-UP visit ASSESSMENTS - Nursing Assessment / Reassessment X- 1 10 Reassessment of Co-morbidities (includes updates in patient status) X- 1 5 Reassessment of Adherence to Treatment Plan ASSESSMENTS - Wound and Skin A  ssessment / Reassessment X - Simple Wound Assessment / Reassessment - one wound 1 5 [name] [name] ([id number]) [id number] Page 2 of 9 []  - 0 Complex Wound Assessment / Reassessment - multiple wounds []  - 0 Dermatologic / Skin Assessment (not related to wound area) ASSESSMENTS - Focused Assessment []  - 0 Circumferential Edema Measurements - multi extremities []  - 0 Nutritional Assessment / Counseling / Intervention []  - 0 Lower Extremity Assessment (monofilament, tuning fork, pulses) []  - 0 Peripheral Arterial Disease Assessment (using hand held doppler) ASSESSMENTS - Ostomy and/or Continence Assessment and Care []  - 0 Incontinence Assessment and Management []  - 0 Ostomy Care Assessment and Management (repouching, etc.) PROCESS - Coordination of Care X - Simple Patient / Family Education for ongoing care 1 15 []  - 0 Complex (extensive) Patient / Family Education for ongoing care X- 1 10 Staff obtains Chiropractor, Records, T Results / Process Orders est []  - 0 Staff telephones HHA, Nursing Homes / Clarify orders / etc []  - 0 Routine Transfer to another Facility (non-emergent condition) []  - 0 Routine Hospital Admission (non-emergent condition) []  - 0 New Admissions / Manufacturing engineer / Ordering NPWT Apligraf, etc. , []  - 0 Emergency Hospital Admission (emergent condition) X- 1 10 Simple Discharge Coordination []  - 0 Complex (extensive) Discharge Coordination PROCESS - Special Needs []  - 0 Pediatric / Minor Patient Management []  - 0 Isolation Patient Management []  - 0 Hearing / Language / Visual special needs []  - 0 Assessment of Community assistance (transportation, [name]/C planning, etc.) []  - 0 Additional assistance / Altered mentation []  - 0 Support Surface(s) Assessment (bed, cushion, seat, etc.) INTERVENTIONS - Wound Cleansing / Measurement X - Simple Wound Cleansing - one wound 1 5 []  - 0 Complex Wound Cleansing - multiple  wounds X- 1 5 Wound Imaging (photographs - any number of wounds) []  - 0 Wound Tracing (instead of photographs) X- 1 5 Simple Wound Measurement - one wound []  - 0 Complex Wound Measurement -  [name] [name] ([id number]) [id number] Page 1 of 9 Visit Report for 01/20/2023 Arrival Information Details Patient Name: Date of Service: [name] [name], [name] [name] 01/20/2023 3:30 PM Medical Record Number: [id number] Patient Account Number: [id number] Date of Birth/Sex: Treating RN: Mar 14, 1966 (57 y.o. [name] [name] Primary Care [name] [name]: Lynnea Ferrier Other Clinician: Referring [name] [name]: Treating [name] [name]/Extender: [name] [name] in Treatment: 1 Visit Information History Since Last Visit Added or deleted any medications: No Patient Arrived: [name] Any new allergies or adverse reactions: No Arrival Time: 15:40 Has Dressing in Place as Prescribed: Yes Accompanied By: self Pain Present Now: No Transfer Assistance: None Patient Identification Verified: Yes Secondary Verification Process Completed: Yes Patient Requires Transmission-Based Precautions: No Patient Has Alerts: Yes Patient Alerts: Prediabetes Electronic Signature(s) Signed: 01/20/2023 4:49:00 PM By: [name] Aver MSN RN CNS WTA Entered By: [name] [name] on 01/20/2023 16:49:00 -------------------------------------------------------------------------------- Clinic Level of Care Assessment Details Patient Name: Date of Service: [name] [name] 01/20/2023 3:30 PM Medical Record Number: [id number] Patient Account Number: [id number] Date of Birth/Sex: Treating RN: 11-Jan-1966 (57 y.o. [name] [name] Primary Care [name] [name]: Lynnea Ferrier Other Clinician: Referring [name] [name]: Treating [name] [name]/Extender: [name] [name] in Treatment: 1 Clinic Level of Care Assessment Items TOOL 4 Quantity Score X- 1 0 Use when only an EandM is performed on FOLLOW-UP visit ASSESSMENTS - Nursing Assessment / Reassessment X- 1 10 Reassessment of Co-morbidities (includes updates in patient status) X- 1 5 Reassessment of Adherence to Treatment Plan ASSESSMENTS - Wound and Skin A  ssessment / Reassessment X - Simple Wound Assessment / Reassessment - one wound 1 5 [name] [name] ([id number]) [id number] Page 2 of 9 []  - 0 Complex Wound Assessment / Reassessment - multiple wounds []  - 0 Dermatologic / Skin Assessment (not related to wound area) ASSESSMENTS - Focused Assessment []  - 0 Circumferential Edema Measurements - multi extremities []  - 0 Nutritional Assessment / Counseling / Intervention []  - 0 Lower Extremity Assessment (monofilament, tuning fork, pulses) []  - 0 Peripheral Arterial Disease Assessment (using hand held doppler) ASSESSMENTS - Ostomy and/or Continence Assessment and Care []  - 0 Incontinence Assessment and Management []  - 0 Ostomy Care Assessment and Management (repouching, etc.) PROCESS - Coordination of Care X - Simple Patient / Family Education for ongoing care 1 15 []  - 0 Complex (extensive) Patient / Family Education for ongoing care X- 1 10 Staff obtains Chiropractor, Records, T Results / Process Orders est []  - 0 Staff telephones HHA, Nursing Homes / Clarify orders / etc []  - 0 Routine Transfer to another Facility (non-emergent condition) []  - 0 Routine Hospital Admission (non-emergent condition) []  - 0 New Admissions / Manufacturing engineer / Ordering NPWT Apligraf, etc. , []  - 0 Emergency Hospital Admission (emergent condition) X- 1 10 Simple Discharge Coordination []  - 0 Complex (extensive) Discharge Coordination PROCESS - Special Needs []  - 0 Pediatric / Minor Patient Management []  - 0 Isolation Patient Management []  - 0 Hearing / Language / Visual special needs []  - 0 Assessment of Community assistance (transportation, [name]/C planning, etc.) []  - 0 Additional assistance / Altered mentation []  - 0 Support Surface(s) Assessment (bed, cushion, seat, etc.) INTERVENTIONS - Wound Cleansing / Measurement X - Simple Wound Cleansing - one wound 1 5 []  - 0 Complex Wound Cleansing - multiple  wounds X- 1 5 Wound Imaging (photographs - any number of wounds) []  - 0 Wound Tracing (instead of photographs) X- 1 5 Simple Wound Measurement - one wound []  - 0 Complex Wound Measurement -

## 2023-01-25 ENCOUNTER — Encounter: Payer: Self-pay | Admitting: *Deleted

## 2023-01-25 ENCOUNTER — Encounter: Payer: 59 | Admitting: Physician Assistant

## 2023-01-25 ENCOUNTER — Ambulatory Visit (INDEPENDENT_AMBULATORY_CARE_PROVIDER_SITE_OTHER): Payer: 59 | Admitting: Gastroenterology

## 2023-01-25 ENCOUNTER — Encounter: Payer: Self-pay | Admitting: Gastroenterology

## 2023-01-25 VITALS — BP 155/87 | HR 77 | Temp 98.6°F | Ht 66.0 in | Wt 237.0 lb

## 2023-01-25 DIAGNOSIS — R7303 Prediabetes: Secondary | ICD-10-CM | POA: Diagnosis not present

## 2023-01-25 DIAGNOSIS — D500 Iron deficiency anemia secondary to blood loss (chronic): Secondary | ICD-10-CM

## 2023-01-25 DIAGNOSIS — I5042 Chronic combined systolic (congestive) and diastolic (congestive) heart failure: Secondary | ICD-10-CM | POA: Diagnosis not present

## 2023-01-25 DIAGNOSIS — L97512 Non-pressure chronic ulcer of other part of right foot with fat layer exposed: Secondary | ICD-10-CM | POA: Diagnosis not present

## 2023-01-25 DIAGNOSIS — I130 Hypertensive heart and chronic kidney disease with heart failure and stage 1 through stage 4 chronic kidney disease, or unspecified chronic kidney disease: Secondary | ICD-10-CM | POA: Diagnosis not present

## 2023-01-25 DIAGNOSIS — G603 Idiopathic progressive neuropathy: Secondary | ICD-10-CM | POA: Diagnosis not present

## 2023-01-25 DIAGNOSIS — N183 Chronic kidney disease, stage 3 unspecified: Secondary | ICD-10-CM | POA: Diagnosis not present

## 2023-01-25 MED ORDER — PEG 3350-KCL-NABCB-NACL-NASULF 236 G PO SOLR: 4000.0000 mL | Freq: Once | ORAL | 0 refills | Status: AC

## 2023-01-25 NOTE — Progress Notes (Signed)
[name] [name] ([id number]) [id number] Page 1 of 8 Visit Report for 01/25/2023 Arrival Information Details Patient Name: Date of Service: [name] [name], [name] [name] 01/25/2023 12:15 PM Medical Record Number: [id number] Patient Account Number: [id number] Date of Birth/Sex: Treating RN: Jan 09, 1966 (57 y.o. [name] Primary Care Ambar Raphael: Lynnea Ferrier Other Clinician: Referring Alicya Bena: Treating Jachelle Fluty/Extender: Trula Ore in Treatment: 1 Visit Information History Since Last Visit Added or deleted any medications: No Patient Arrived: [name] Any new allergies or adverse reactions: No Arrival Time: 12:16 Has Dressing in Place as Prescribed: Yes Accompanied By: self Pain Present Now: No Transfer Assistance: None Patient Identification Verified: Yes Secondary Verification Process Completed: Yes Patient Requires Transmission-Based Precautions: No Patient Has Alerts: Yes Patient Alerts: Prediabetes Electronic Signature(s) Signed: 01/25/2023 12:30:45 PM By: Midge Aver MSN RN CNS WTA Entered By: Midge Aver on 01/25/2023 12:30:45 -------------------------------------------------------------------------------- Clinic Level of Care Assessment Details Patient Name: Date of Service: [name] [name] 01/25/2023 12:15 PM Medical Record Number: [id number] Patient Account Number: [id number] Date of Birth/Sex: Treating RN: 1965-12-07 (57 y.o. [name] Primary Care Zhaniya Swallows: Lynnea Ferrier Other Clinician: Referring Sneha Willig: Treating Shakima Nisley/Extender: Trula Ore in Treatment: 1 Clinic Level of Care Assessment Items TOOL 1 Quantity Score []  - 0 Use when EandM and Procedure is performed on INITIAL visit ASSESSMENTS - Nursing Assessment / Reassessment []  - 0 General Physical Exam (combine w/ comprehensive assessment (listed just below) when performed on new pt. evals) []  - 0 Comprehensive Assessment (HX,  ROS, Risk Assessments, Wounds Hx, etc.) ASSESSMENTS - Wound and Skin Assessment / Reassessment []  - 0 Dermatologic / Skin Assessment (not related to wound area) ASSESSMENTS - Ostomy and/or Continence Assessment and Care CECI, TALIAFERRO ([id number]) [id number] Page 2 of 8 []  - 0 Incontinence Assessment and Management []  - 0 Ostomy Care Assessment and Management (repouching, etc.) PROCESS - Coordination of Care []  - 0 Simple Patient / Family Education for ongoing care []  - 0 Complex (extensive) Patient / Family Education for ongoing care []  - 0 Staff obtains Consents, Records, T Results / Process Orders est []  - 0 Staff telephones HHA, Nursing Homes / Clarify orders / etc []  - 0 Routine Transfer to another Facility (non-emergent condition) []  - 0 Routine Hospital Admission (non-emergent condition) []  - 0 New Admissions / Manufacturing engineer / Ordering NPWT Apligraf, etc. , []  - 0 Emergency Hospital Admission (emergent condition) PROCESS - Special Needs []  - 0 Pediatric / Minor Patient Management []  - 0 Isolation Patient Management []  - 0 Hearing / Language / Visual special needs []  - 0 Assessment of Community assistance (transportation, D/C planning, etc.) []  - 0 Additional assistance / Altered mentation []  - 0 Support Surface(s) Assessment (bed, cushion, seat, etc.) INTERVENTIONS - Miscellaneous []  - 0 External ear exam []  - 0 Patient Transfer (multiple staff / Nurse, adult / Similar devices) []  - 0 Simple Staple / Suture removal (25 or less) []  - 0 Complex Staple / Suture removal (26 or more) []  - 0 Hypo/Hyperglycemic Management (do not check if billed separately) []  - 0 Ankle / Brachial Index (ABI) - do not check if billed separately Has the patient been seen at the hospital within the last three years: Yes Total Score: 0 Level Of Care: ____ Electronic Signature(s) Signed: 01/25/2023 5:02:18 PM By: Midge Aver MSN RN CNS  WTA Entered By: Midge Aver on 01/25/2023 13:23:47 -------------------------------------------------------------------------------- Encounter Discharge Information Details Patient Name: Date of Service: [name] Cloud D. 01/25/2023 12:15 PM Medical Record  [name] [name] ([id number]) [id number] Page 1 of 8 Visit Report for 01/25/2023 Arrival Information Details Patient Name: Date of Service: [name] [name], [name] [name] 01/25/2023 12:15 PM Medical Record Number: [id number] Patient Account Number: [id number] Date of Birth/Sex: Treating RN: Jan 09, 1966 (57 y.o. [name] Primary Care Ambar Raphael: Lynnea Ferrier Other Clinician: Referring Alicya Bena: Treating Jachelle Fluty/Extender: Trula Ore in Treatment: 1 Visit Information History Since Last Visit Added or deleted any medications: No Patient Arrived: [name] Any new allergies or adverse reactions: No Arrival Time: 12:16 Has Dressing in Place as Prescribed: Yes Accompanied By: self Pain Present Now: No Transfer Assistance: None Patient Identification Verified: Yes Secondary Verification Process Completed: Yes Patient Requires Transmission-Based Precautions: No Patient Has Alerts: Yes Patient Alerts: Prediabetes Electronic Signature(s) Signed: 01/25/2023 12:30:45 PM By: Midge Aver MSN RN CNS WTA Entered By: Midge Aver on 01/25/2023 12:30:45 -------------------------------------------------------------------------------- Clinic Level of Care Assessment Details Patient Name: Date of Service: [name] [name] 01/25/2023 12:15 PM Medical Record Number: [id number] Patient Account Number: [id number] Date of Birth/Sex: Treating RN: 1965-12-07 (57 y.o. [name] Primary Care Zhaniya Swallows: Lynnea Ferrier Other Clinician: Referring Sneha Willig: Treating Shakima Nisley/Extender: Trula Ore in Treatment: 1 Clinic Level of Care Assessment Items TOOL 1 Quantity Score []  - 0 Use when EandM and Procedure is performed on INITIAL visit ASSESSMENTS - Nursing Assessment / Reassessment []  - 0 General Physical Exam (combine w/ comprehensive assessment (listed just below) when performed on new pt. evals) []  - 0 Comprehensive Assessment (HX,  ROS, Risk Assessments, Wounds Hx, etc.) ASSESSMENTS - Wound and Skin Assessment / Reassessment []  - 0 Dermatologic / Skin Assessment (not related to wound area) ASSESSMENTS - Ostomy and/or Continence Assessment and Care CECI, TALIAFERRO ([id number]) [id number] Page 2 of 8 []  - 0 Incontinence Assessment and Management []  - 0 Ostomy Care Assessment and Management (repouching, etc.) PROCESS - Coordination of Care []  - 0 Simple Patient / Family Education for ongoing care []  - 0 Complex (extensive) Patient / Family Education for ongoing care []  - 0 Staff obtains Consents, Records, T Results / Process Orders est []  - 0 Staff telephones HHA, Nursing Homes / Clarify orders / etc []  - 0 Routine Transfer to another Facility (non-emergent condition) []  - 0 Routine Hospital Admission (non-emergent condition) []  - 0 New Admissions / Manufacturing engineer / Ordering NPWT Apligraf, etc. , []  - 0 Emergency Hospital Admission (emergent condition) PROCESS - Special Needs []  - 0 Pediatric / Minor Patient Management []  - 0 Isolation Patient Management []  - 0 Hearing / Language / Visual special needs []  - 0 Assessment of Community assistance (transportation, D/C planning, etc.) []  - 0 Additional assistance / Altered mentation []  - 0 Support Surface(s) Assessment (bed, cushion, seat, etc.) INTERVENTIONS - Miscellaneous []  - 0 External ear exam []  - 0 Patient Transfer (multiple staff / Nurse, adult / Similar devices) []  - 0 Simple Staple / Suture removal (25 or less) []  - 0 Complex Staple / Suture removal (26 or more) []  - 0 Hypo/Hyperglycemic Management (do not check if billed separately) []  - 0 Ankle / Brachial Index (ABI) - do not check if billed separately Has the patient been seen at the hospital within the last three years: Yes Total Score: 0 Level Of Care: ____ Electronic Signature(s) Signed: 01/25/2023 5:02:18 PM By: Midge Aver MSN RN CNS  WTA Entered By: Midge Aver on 01/25/2023 13:23:47 -------------------------------------------------------------------------------- Encounter Discharge Information Details Patient Name: Date of Service: [name] Cloud D. 01/25/2023 12:15 PM Medical Record  8 of 8 Discharge Instruction: Gently cleanse wound with antibacterial soap, rinse and pat dry prior to dressing wounds Peri-Wound Care Topical Primary Dressing Hydrofera Blue Ready Transfer Foam, 2.5x2.5 (in/in) Discharge Instruction: Apply Hydrofera Blue Ready to wound bed as directed Secondary Dressing ABD Pad 5x9 (in/in) Discharge Instruction: Cover with ABD pad Secured With Medipore T - 35M Medipore H Soft Cloth Surgical T ape ape, 2x2 (in/yd) Kerlix Roll Sterile or Non-Sterile 6-ply 4.5x4 (yd/yd) Discharge Instruction: Apply Kerlix as directed Compression Wrap Compression Stockings Add-Ons Electronic Signature(s) Signed: 01/25/2023 5:02:18 PM By: Midge Aver MSN RN CNS WTA Entered By: Midge Aver on 01/25/2023 12:27:09 -------------------------------------------------------------------------------- Vitals Details Patient Name: Date of Service: [name] Cloud D. 01/25/2023 12:15 PM Medical Record Number: [id number] Patient Account Number: [id number] Date of Birth/Sex: Treating RN: [birth year] (57 y.o. [name] Primary Care Shameka Aggarwal: Lynnea Ferrier Other Clinician: Referring Lodema Parma: Treating Adaora Mchaney/Extender: Trula Ore in Treatment: 1 Vital Signs Time Taken: 12:20 Temperature (F): 97.6 Height (in): 64 Pulse (bpm): 82 Weight (lbs): 235 Respiratory Rate (breaths/min): 18 Body Mass Index (BMI): 40.3 Blood Pressure (mmHg): 145/85 Reference Range: 80 - 120 mg / dl Electronic Signature(s) Signed: 01/25/2023 12:30:49 PM By: Midge Aver MSN RN CNS WTA Entered By: Midge Aver on 01/25/2023 12:30:49  8 of 8 Discharge Instruction: Gently cleanse wound with antibacterial soap, rinse and pat dry prior to dressing wounds Peri-Wound Care Topical Primary Dressing Hydrofera Blue Ready Transfer Foam, 2.5x2.5 (in/in) Discharge Instruction: Apply Hydrofera Blue Ready to wound bed as directed Secondary Dressing ABD Pad 5x9 (in/in) Discharge Instruction: Cover with ABD pad Secured With Medipore T - 35M Medipore H Soft Cloth Surgical T ape ape, 2x2 (in/yd) Kerlix Roll Sterile or Non-Sterile 6-ply 4.5x4 (yd/yd) Discharge Instruction: Apply Kerlix as directed Compression Wrap Compression Stockings Add-Ons Electronic Signature(s) Signed: 01/25/2023 5:02:18 PM By: Midge Aver MSN RN CNS WTA Entered By: Midge Aver on 01/25/2023 12:27:09 -------------------------------------------------------------------------------- Vitals Details Patient Name: Date of Service: [name] Cloud D. 01/25/2023 12:15 PM Medical Record Number: [id number] Patient Account Number: [id number] Date of Birth/Sex: Treating RN: [birth year] (57 y.o. [name] Primary Care Shameka Aggarwal: Lynnea Ferrier Other Clinician: Referring Lodema Parma: Treating Adaora Mchaney/Extender: Trula Ore in Treatment: 1 Vital Signs Time Taken: 12:20 Temperature (F): 97.6 Height (in): 64 Pulse (bpm): 82 Weight (lbs): 235 Respiratory Rate (breaths/min): 18 Body Mass Index (BMI): 40.3 Blood Pressure (mmHg): 145/85 Reference Range: 80 - 120 mg / dl Electronic Signature(s) Signed: 01/25/2023 12:30:49 PM By: Midge Aver MSN RN CNS WTA Entered By: Midge Aver on 01/25/2023 12:30:49

## 2023-01-25 NOTE — Progress Notes (Addendum)
Patient Name: Date of Service: [name] [name], [name] [name] 01/25/2023 12:15 PM Medical Record Number: [id number] Patient Account Number: [id number] Date of Birth/Sex: Treating RN: [birth year] (57 y.o. [name] [name] Primary Care Provider: Lynnea Ferrier Other Clinician: Referring Provider: Treating Provider/Extender: Trula Ore in Treatment: 1 T Contact Cast Applied for Wound Assessment: otal Wound #1 Right,Plantar,Posterior Foot Performed By: Physician Allen Derry, PA-C Post Procedure Diagnosis Same as Pre-procedure Electronic Signature(s) Signed: 01/25/2023 4:49:45 PM By: Allen Derry PA-C Signed: 01/25/2023 5:02:18 PM By: Midge Aver MSN RN CNS WTA Entered By: Midge Aver on 01/25/2023  13:22:08 -------------------------------------------------------------------------------- SuperBill Details Patient Name: Date of Service: [name] [name] 01/25/2023 Medical Record Number: [id number] Patient Account Number: [id number] Date of Birth/Sex: Treating RN: [birth year] (57 y.o. 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 I10 Essential (primary) hypertension I50.42 Chronic combined systolic (congestive) and diastolic (congestive) heart failure N18.30 Chronic kidney disease, stage 3 unspecified [name] [name], [name] [name] ([id number]) [id number] Page 8 of 8 R73.03 Prediabetes Facility Procedures : CPT4 Code: 63016010 Description: 11042 - DEB SUBQ TISSUE 20 SQ CM/< ICD-10 Diagnosis Description L97.512 Non-pressure chronic ulcer of other part of right foot with fat layer exposed Modifier: Quantity: 1 Physician Procedures : CPT4 Code Description Modifier [id number] 11042 - WC PHYS SUBQ TISS 20 SQ CM ICD-10 Diagnosis Description L97.512 Non-pressure chronic ulcer of other part of right foot with fat layer exposed Quantity: 1 Electronic Signature(s) Signed: 01/25/2023 1:42:22 PM By: Allen Derry PA-C Entered By: Allen Derry on 01/25/2023 13:42:21  do believe that the patient is making excellent headway towards complete closure which is wonderful news. 01-25-2023 upon evaluation today patient appears to be doing well currently in regard to her wound although it is not significantly smaller today compared to what it was previous. I do believe initiating total contact cast has been to be appropriate at this point. Electronic Signature(s) Signed: 01/25/2023 1:19:30 PM By: Allen Derry PA-C Entered By: Allen Derry on 01/25/2023 13:19:30 [name] [name] ([id number]) [id number] Page 3 of 8 -------------------------------------------------------------------------------- Physical Exam Details Patient Name: Date of Service: [name] [name], [name] [name] 01/25/2023 12:15 PM Medical Record Number: [id number] Patient Account Number: [id number] Date of Birth/Sex: Treating RN: 11-15-1965 (57 y.o. [name] [name] Primary Care Provider: Lynnea Ferrier Other Clinician: Referring Provider: Treating Provider/Extender: Wilma Flavin Weeks in Treatment: 1 Constitutional Well-nourished and well-hydrated in no acute distress. Respiratory normal breathing without difficulty. Psychiatric this patient is able to make decisions and demonstrates good insight into disease process. Alert and Oriented x 3. pleasant and cooperative. Notes Upon inspection patient's wound bed actually showed signs of good granulation epithelization at this point. Fortunately I do not see any signs of  worsening overall. I do believe that the patient is making headway towards closure and I am very pleased but I do think we need to speed this up with the cast try to get this going as quickly as possible there is a little bit of redness around the edges of the wound a little warmth I am unable and put her on Bactrim as a preventative measure to make sure that nothing hopefully becomes infected. She is taking this before without complication. Electronic Signature(s) Signed: 01/25/2023 1:19:59 PM By: Allen Derry PA-C Entered By: Allen Derry on 01/25/2023 13:19:58 -------------------------------------------------------------------------------- Physician Orders Details Patient Name: Date of Service: [name] [name]. 01/25/2023 12:15 PM Medical Record Number: [id number] Patient Account Number: [id number] Date of Birth/Sex: Treating RN: October 07, 1965 (57 y.o. 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 Bathing/ [employer] wounds with antibacterial soap and water. May shower; gently cleanse wound with antibacterial soap, rinse and pat dry prior to dressing wounds Anesthetic (Use 'Patient Medications' Section for Anesthetic Order Entry) Lidocaine applied to wound bed Off-Loading Total Contact Cast to Right Lower Extremity - Initiated on  01/25/2023 Wound Treatment Wound #1 - Foot Wound Laterality: Plantar, Right, Posterior Cleanser: [employer] Ancillary Kit - 15 Day Supply (Generic) 3 x Per Week/30 Days Discharge Instructions: Use supplies as instructed; Kit contains: (15) Saline Bullets; (15) 3x3 Gauze; 15 pr Gloves Cleanser: Soap and Water 3 x Per Week/30 Days Discharge Instructions: Gently cleanse wound with antibacterial soap, rinse and pat dry prior to dressing wounds Prim Dressing: Hydrofera Blue Ready Transfer Foam, 2.5x2.5 (in/in) (Dispense As Written) 3 x Per Week/30 Days ary Discharge Instructions: Apply Hydrofera Blue Ready to wound bed as directed Secondary Dressing: ABD Pad 5x9 (in/in) (Generic) 3 x Per Week/30 Days Discharge Instructions: Cover with ABD pad Secured With: Medipore T - 64M Medipore H Soft Cloth Surgical T  Patient Name: Date of Service: [name] [name], [name] [name] 01/25/2023 12:15 PM Medical Record Number: [id number] Patient Account Number: [id number] Date of Birth/Sex: Treating RN: [birth year] (57 y.o. [name] [name] Primary Care Provider: Lynnea Ferrier Other Clinician: Referring Provider: Treating Provider/Extender: Trula Ore in Treatment: 1 T Contact Cast Applied for Wound Assessment: otal Wound #1 Right,Plantar,Posterior Foot Performed By: Physician Allen Derry, PA-C Post Procedure Diagnosis Same as Pre-procedure Electronic Signature(s) Signed: 01/25/2023 4:49:45 PM By: Allen Derry PA-C Signed: 01/25/2023 5:02:18 PM By: Midge Aver MSN RN CNS WTA Entered By: Midge Aver on 01/25/2023  13:22:08 -------------------------------------------------------------------------------- SuperBill Details Patient Name: Date of Service: [name] [name] 01/25/2023 Medical Record Number: [id number] Patient Account Number: [id number] Date of Birth/Sex: Treating RN: [birth year] (57 y.o. 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 I10 Essential (primary) hypertension I50.42 Chronic combined systolic (congestive) and diastolic (congestive) heart failure N18.30 Chronic kidney disease, stage 3 unspecified [name] [name], [name] [name] ([id number]) [id number] Page 8 of 8 R73.03 Prediabetes Facility Procedures : CPT4 Code: 63016010 Description: 11042 - DEB SUBQ TISSUE 20 SQ CM/< ICD-10 Diagnosis Description L97.512 Non-pressure chronic ulcer of other part of right foot with fat layer exposed Modifier: Quantity: 1 Physician Procedures : CPT4 Code Description Modifier [id number] 11042 - WC PHYS SUBQ TISS 20 SQ CM ICD-10 Diagnosis Description L97.512 Non-pressure chronic ulcer of other part of right foot with fat layer exposed Quantity: 1 Electronic Signature(s) Signed: 01/25/2023 1:42:22 PM By: Allen Derry PA-C Entered By: Allen Derry on 01/25/2023 13:42:21  ape ape, 2x2 (in/yd) (Generic) 3 x Per Week/30 Days Secured With: [employer] Sterile or Non-Sterile 6-ply 4.5x4 (yd/yd) (Generic) 3 x Per Week/30 Days Discharge Instructions: Apply Kerlix as directed Patient Medications llergies: tetracycline, tramadol, oxybutynin, phenazopyridine, ciprofloxacin, codeine, estradiol A Notifications Medication Indication Start End 01/25/2023 Bactrim DS DOSE 1 - oral 800 mg-160 mg tablet - 1 tablet oral twice a day x 14 days Electronic Signature(s) Signed: 01/25/2023 2:08:49 PM By: Allen Derry PA-C Entered By: Allen Derry on 01/25/2023 14:08:48 -------------------------------------------------------------------------------- Problem List Details Patient Name: Date of Service: [name] [name]. 01/25/2023 12:15 PM Medical Record Number: [id number] Patient Account Number: [id number] Date of Birth/Sex: Treating RN: 09-24-1965 (57 y.o. 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 01/13/2023 No Yes L97.512 Non-pressure chronic ulcer of other part of right foot with fat layer exposed 01/13/2023 No Yes I10 Essential (primary) hypertension 01/13/2023 No Yes I50.42 Chronic combined systolic (congestive) and diastolic (congestive) heart failure 01/13/2023 No Yes N18.30 Chronic kidney disease, stage 3 unspecified 01/13/2023 No Yes R73.03 Prediabetes 01/13/2023 No Yes Inactive Problems Resolved Problems Electronic Signature(s) Signed: 01/25/2023 12:55:04 PM By: Allen Derry PA-C Entered By: Allen Derry on 01/25/2023 12:55:04 -------------------------------------------------------------------------------- Progress Note Details Patient Name: Date of Service: [name] [name]. 01/25/2023 12:15 PM Medical Record Number: [id number] Patient Account Number: [id number] Date of Birth/Sex: Treating RN: 09/07/1965 (57 y.o. [name] [name] Primary Care Provider: Lynnea Ferrier Other Clinician: Referring Provider: Treating Provider/Extender: Trula Ore in Treatment: 1 Subjective Chief Complaint Information obtained from Patient Right plantar foot ulcer History of Present Illness (HPI) Chronic/Inactive Condition: 01-13-2023 patient's ABI on the left was 1.14 and on the right was 1.01 on screening today here in the clinic 01-13-2023 upon evaluation patient presents for initial inspection here in our clinic concerning an issue that actually occurring over the distal/plantar aspect of her right foot. 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 She tells me this started about a month ago encounter gradually appeared there was no inciting event. With that being said the patient tells me that she has been using Silvadene as prescribed by Dr. Logan Bores.  This has not really helped much with getting the wound to heal. She has previously had total contact casting when she was in [place] and that something that she states she did very well with. Actually think that is probably good to be the way to go to be perfectly honest. Patient does have a history of hypertension, congestive heart failure, chronic kidney disease stage III, and again prediabetes although she is not on any oral medication this is just being monitored at this point. 01-20-2023 upon evaluation today patient appears to be doing well currently in regard to her wound. Her foot ulcer actually showing signs of improvement which [name] [name], [name] [name] ([id number]) [id number] Page 6 of 8 I feel like is excellent news. I do not see any signs of worsening overall and I do believe that the patient is making excellent headway towards complete closure which is wonderful news. 01-25-2023 upon evaluation today patient appears to be doing well currently in regard to her wound although it is not significantly smaller today compared to what it was previous. I do believe initiating total  [name] [name], [name] [name] ([id number]) [id number] Page 1 of 8 Visit Report for 01/25/2023 Chief Complaint Document Details Patient Name: Date of Service: [name] [name], [name] [name] 01/25/2023 12:15 PM Medical Record Number: [id number] Patient Account Number: [id number] Date of Birth/Sex: Treating RN: 01-Oct-1965 (57 y.o. [name] [name] Primary Care Provider: Lynnea Ferrier Other Clinician: Referring Provider: Treating Provider/Extender: Trula Ore in Treatment: 1 Information Obtained from: Patient Chief Complaint Right plantar foot ulcer Electronic Signature(s) Signed: 01/25/2023 12:55:21 PM By: Allen Derry PA-C Entered By: Allen Derry on 01/25/2023 12:55:21 -------------------------------------------------------------------------------- Debridement Details Patient Name: Date of Service: [name] [name]. 01/25/2023 12:15 PM Medical Record Number: [id number] Patient Account Number: [id number] Date of Birth/Sex: Treating RN: 08/27/1965 (57 y.o. [name] [name] Primary Care Provider: Lynnea Ferrier Other Clinician: Referring Provider: Treating Provider/Extender: Trula Ore in Treatment: 1 Debridement Performed for Assessment: Wound #1 Right,Plantar,Posterior Foot Performed By: Physician Allen Derry, PA-C Debridement Type: Debridement Level of Consciousness (Pre-procedure): Awake and Alert Pre-procedure Verification/Time Out Yes - 13:01 Taken: Start Time: 13:01 Pain Control: Lidocaine 4% T opical Solution Percent of Wound Bed Debrided: 100% T Area Debrided (cm): otal 0.27 Tissue and other material debrided: Viable, Non-Viable, Callus, Slough, Subcutaneous, Slough Level: Skin/Subcutaneous Tissue Debridement Description: Excisional Instrument: Curette Bleeding: Minimum Hemostasis Achieved: Pressure Procedural Pain: 0 Post Procedural Pain: 0 Response to Treatment: Procedure was tolerated well [name] [name], [name] [name]  ([id number]) [id number] Page 2 of 8 Level of Consciousness (Post- Awake and Alert procedure): Post Debridement Measurements of Total Wound Length: (cm) 0.5 Width: (cm) 0.7 Depth: (cm) 0.2 Volume: (cm) 0.055 Character of Wound/Ulcer Post Debridement: Stable Post Procedure Diagnosis Same as Pre-procedure Electronic Signature(s) Signed: 01/25/2023 4:49:45 PM By: Allen Derry PA-C Signed: 01/25/2023 5:02:18 PM By: Midge Aver MSN RN CNS WTA Entered By: Midge Aver on 01/25/2023 13:02:35 -------------------------------------------------------------------------------- HPI Details Patient Name: Date of Service: [name] [name]. 01/25/2023 12:15 PM Medical Record Number: [id number] Patient Account Number: [id number] Date of Birth/Sex: Treating RN: May 01, 1965 (57 y.o. [name] [name] Primary Care Provider: Lynnea Ferrier Other Clinician: Referring Provider: Treating Provider/Extender: Wilma Flavin Weeks in Treatment: 1 History of Present Illness Chronic/Inactive Conditions Condition 1: 01-13-2023 patient's ABI on the left was 1.14 and on the right was 1.01 on screening today here in the clinic HPI Description: 01-13-2023 upon evaluation patient presents for initial inspection here in our clinic concerning an issue that actually occurring over the distal/plantar aspect of her right foot. 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 She tells me this started about a month ago encounter gradually appeared there was no inciting event. With that being said the patient tells me that she has been using Silvadene as prescribed by Dr. Logan Bores. This has not really helped much with getting the wound to heal. She has previously had total contact casting when she was in [place] and that something that she states she did very well with. Actually think that is probably good to be the way to go to be perfectly  honest. Patient does have a history of hypertension, congestive heart failure, chronic kidney disease stage III, and again prediabetes although she is not on any oral medication this is just being monitored at this point. 01-20-2023 upon evaluation today patient appears to be doing well currently in regard to her wound. Her foot ulcer actually showing signs of improvement which I feel like is excellent news. I do not see any signs of worsening overall and I

## 2023-01-25 NOTE — Progress Notes (Signed)
Gastroenterology Consultation  Referring Provider:     Donita Brooks, MD Primary Care Physician:  Donita Brooks, MD Primary Gastroenterologist:  Dr. Servando Snare     Reason for Consultation:     Blood in stool        HPI:   [name] [name] is a 56 y.o. y/o female referred for consultation & management of blood in stool by Dr. Tanya Nones, Priscille Heidelberg, MD. This patient comes in today with a history of having blood work and stool sample sent out back in July.  It appears that the patient had a fit test that showed blood in the stool.  The patient had seen Dr. Myrtie Neither in [place] in 2017 for abdominal pain and GERD and had an upper endoscopy at that time. Patient is on iron and reports that is why her iron levels are back to normal but she does have a history of iron deficiency anemia.  The patient denies any unexplained weight loss but does report that she sees bright red blood when she goes to the bathroom.  She believes that the blood is painted on the stools and not mixed with the stools.  She denies any abdominal pain but she does have constipation with 1 bowel movement a week on average.  Past Medical History:  Diagnosis Date   Acute metabolic encephalopathy 06/08/2022   AMS (altered mental status) 05/22/2022   Anxiety    Arthritis    Phreesia 02/08/2020   Asthma    mild intermittent   Asthma    Phreesia 02/08/2020   Bipolar 1 disorder ([name])    ect treatments last treatment Sep 02 1011   Depression    Depression    Phreesia 02/08/2020   Depression    Phreesia 07/10/2020   GERD (gastroesophageal reflux disease)    Headache    Hypertension    Pre-diabetes    Seizures ([name])    Thinks it was from taking Tramadol   Septic shock ([name]) 05/22/2022   Shortness of breath 09/23/2016   Overview:   exertional   Sleep apnea    AHI 40 (2024)   Substance abuse ([name])    Phreesia 02/08/2020   Syncope 08/26/2019   Syncope and collapse     Past Surgical History:  Procedure Laterality  Date   ABDOMINAL HYSTERECTOMY     AMPUTATION TOE Left 02/02/2018   Procedure: AMPUTATION TOE Left 4th toe;  Surgeon: Vivi Barrack, DPM;  Location: [facility] OR;  Service: Podiatry;  Laterality: Left;   APPENDECTOMY N/A    Phreesia 02/08/2020   BACK SURGERY  2018   ACDF   C5-6 & C6-7 by Dr. Sharolyn Douglas   CARPAL TUNNEL RELEASE     x2   I & D EXTREMITY Right 05/22/2022   Procedure: IRRIGATION AND DEBRIDEMENT EXTREMITY;  Surgeon: Candelaria Stagers, DPM;  Location: [facility];  Service: Podiatry;  Laterality: Right;   INCISION AND DRAINAGE Right 05/24/2022   Procedure: RIGHT REVISION INCISION AND DRAINAGE/ WASHOUT WITH PRIMARY DELAYED CLOSURE;  Surgeon: Edwin Cap, DPM;  Location: [facility];  Service: Podiatry;  Laterality: Right;   IRRIGATION AND DEBRIDEMENT ABSCESS Right 10/25/2020   Procedure: IRRIGATION AND DEBRIDEMENT ABSCESS OF FOOT AND APPLICATION OF GRAFT;  Surgeon: Vivi Barrack, DPM;  Location: [facility] OR;  Service: Podiatry;  Laterality: Right;   Laproscopic knee surgery Right    NECK SURGERY  03/15/2017   ACDF   by Dr. Noel Gerold   PLANTAR FASCIA RELEASE  x2   REVERSE SHOULDER ARTHROPLASTY Right 04/14/2022   Procedure: REVERSE SHOULDER ARTHROPLASTY;  Surgeon: Bjorn Pippin, MD;  Location: [facility];  Service: Orthopedics;  Laterality: Right;   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   Procedure: TRANSMETATARSAL AMPUTATION;  Surgeon: Vivi Barrack, DPM;  Location: [facility];  Service: Podiatry;  Laterality: Right;   TRANSMETATARSAL AMPUTATION Right 05/27/2022   Procedure: REVISION TRANSMETATARSAL AMPUTATION WITH DELAYED PRIMARY CLOSURE;  Surgeon: Felecia Shelling, DPM;  Location: [facility];  Service: Podiatry;  Laterality: Right;    Prior to Admission medications   Medication Sig Start Date End Date Taking? Authorizing Provider  albuterol (PROAIR HFA) 108 (90 Base) MCG/ACT inhaler Inhale 2 puffs = into the lungs every 6 (six) hours as needed for wheezing or shortness of  breath. Patient taking differently: Inhale 2 puffs into the lungs every 6 (six) hours as needed for wheezing or shortness of breath. 07/11/20   Valentino Nose, NP  busPIRone (BUSPAR) 15 MG tablet Take 15 mg by mouth 3 (three) times daily.  04/26/17   [provider]  Calcium Carbonate-Vit D-Min (CALCIUM 1200 PO) Take 1,200 mg by mouth daily.     [provider]  DULoxetine (CYMBALTA) 60 MG capsule TAKE 1 CAPSULE BY MOUTH ONCE DAILY *REFILL REQUEST* 12/28/22   Donita Brooks, MD  Erenumab-aooe (AIMOVIG) 70 MG/ML SOAJ administer 1mL UNDER THE SKIN every 30 DAYS 10/08/22   Donita Brooks, MD  escitalopram (LEXAPRO) 10 MG tablet Take 10 mg by mouth daily. 08/23/19   [provider]  estradiol (ESTRACE) 1 MG tablet TAKE ONE TABLET BY MOUTH EVERYDAY AT BEDTIME 07/19/22   Donita Brooks, MD  famotidine (PEPCID) 40 MG tablet TAKE 1 TABLET BY MOUTH EVERY DAY AT BEDTIME 12/28/22   Donita Brooks, MD  ferrous sulfate (FEROSUL) 325 (65 FE) MG tablet TAKE 1 TABLET BY MOUTH AT [facility] AND AT BEDTIME 01/13/23   Donita Brooks, MD  furosemide (LASIX) 40 MG tablet Take 1 tablet (40 mg total) by mouth daily. 06/18/22   Donita Brooks, MD  haloperidol (HALDOL) 5 MG tablet Take 5 mg by mouth at bedtime. 11/11/22   [provider]  lamoTRIgine (LAMICTAL) 100 MG tablet Take 100 mg by mouth daily. 08/06/21   [provider]  levocetirizine (XYZAL) 5 MG tablet TAKE 1 TABLET BY MOUTH EVERY EVENING 12/27/22   Donita Brooks, MD  losartan (COZAAR) 50 MG tablet Take 1 tablet (50 mg total) by mouth daily. 06/27/22 09/25/22  Pokhrel, Rebekah Chesterfield, MD  methocarbamol (ROBAXIN) 500 MG tablet Take 500 mg by mouth every 8 (eight) hours as needed.    [provider]  metoprolol succinate (TOPROL-XL) 25 MG 24 hr tablet Take 25 mg by mouth daily. 10/04/22   [provider]  Multiple Vitamins-Minerals (MULTIVITAMIN WITH MINERALS) tablet Take 1 tablet by mouth daily.     [provider]  naloxone [facility]) nasal spray 4 mg/0.1 mL Place 0.4 mg into the nose once. 08/03/21   [provider]  oxyCODONE-acetaminophen (PERCOCET) 10-325 MG tablet Take one tablet by mouth up to 4 times daily as needed.    [provider]  pantoprazole (PROTONIX) 40 MG tablet TAKE ONE (1) TABLET BY MOUTH TWICE DAILY 12/28/22   Donita Brooks, MD  polyethylene glycol (MIRALAX / GLYCOLAX) 17 g packet Take 17 g by mouth daily. 07/13/22   Kathlen Mody, MD  prazosin (MINIPRESS) 2 MG capsule Take 2 mg by mouth at  bedtime.    [provider]  pregabalin (LYRICA) 100 MG capsule Take 1 capsule (100 mg total) by mouth 2 (two) times daily. Patient taking differently: Take 100 mg by mouth 3 (three) times daily. 07/27/21   Donita Brooks, MD  silver sulfADIAZINE (SILVADENE) 1 % cream Apply 1 Application topically daily. 12/24/22   Felecia Shelling, DPM  solifenacin (VESICARE) 10 MG tablet TAKE 1 TABLET BY MOUTH ONCE DAILY 12/28/22   Donita Brooks, MD  SUMAtriptan (IMITREX) 50 MG tablet Take 50 mg by mouth every 2 (two) hours as needed for migraine. 08/30/19   [provider]  topiramate (TOPAMAX) 50 MG tablet TAKE ONE (1) TABLET BY MOUTH TWICE DAILY 12/28/22   Donita Brooks, MD  traZODone (DESYREL) 100 MG tablet Take 1 tablet (100 mg total) by mouth at bedtime. Patient taking differently: Take 200 mg by mouth at bedtime. 07/12/22   Kathlen Mody, MD    Family History  Problem Relation Age of Onset   Diabetes Mother    COPD Mother    Hypertension Mother    Hyperlipidemia Mother    Sleep apnea Mother    Heart disease Father    Hyperlipidemia Father    Hypertension Father    Cancer Father    Sleep apnea Father    Heart disease Brother    Sleep apnea Brother    Heart disease Maternal Grandmother    Hypertension Maternal Grandmother    Sleep apnea Maternal Grandmother    Heart disease Maternal Grandfather    Kidney cancer Paternal Grandmother     Heart disease Paternal Grandfather    Heart disease Daughter      Social History   Tobacco Use   Smoking status: Never   Smokeless tobacco: Never  Vaping Use   Vaping status: Never Used  Substance Use Topics   Alcohol use: No   Drug use: No    Allergies as of 01/25/2023 - Review Complete 01/18/2023  Allergen Reaction Noted   Tetracyclines & related Other (See Comments) 03/09/2011   Tramadol Other (See Comments) 03/09/2011   Oxybutynin Other (See Comments) 07/06/2022   Phenazopyridine Other (See Comments) 01/23/2018   Ciprofloxacin Rash and Itching 03/09/2011   Codeine Itching and Rash 03/09/2011   Estradiol Rash 09/24/2017    Review of Systems:    All systems reviewed and negative except where noted in HPI.   Physical Exam:  There were no vitals taken for this visit. No LMP recorded. Patient has had a hysterectomy. General:   Alert,  Well-developed, well-nourished, pleasant and cooperative in NAD Head:  Normocephalic and atraumatic. Eyes:  Sclera clear, no icterus.   Conjunctiva pink. Ears:  Normal auditory acuity. Neck:  Supple; no masses or thyromegaly. Lungs:  Respirations even and unlabored.  Clear throughout to auscultation.   No wheezes, crackles, or rhonchi. No acute distress. Heart:  Regular rate and rhythm; no murmurs, clicks, rubs, or gallops. Abdomen:  Normal bowel sounds.  No bruits.  Soft, non-tender and non-distended without masses, hepatosplenomegaly or hernias noted.  No guarding or rebound tenderness.  Negative Carnett sign.   Rectal:  Deferred.  Pulses:  Normal pulses noted. Extremities:  No clubbing or edema.  No cyanosis. Neurologic:  Alert and oriented x3;  grossly normal neurologically. Skin:  Intact without significant lesions or rashes.  No jaundice. Lymph Nodes:  No significant cervical adenopathy. Psych:  Alert and cooperative. Normal mood and affect.  Imaging Studies: No results found.  Assessment and Plan:  ZAIRA IACOVELLI is a 57  y.o. y/o female who comes in today with constipation heme positive stools with a report of rectal bleeding.  The patient has not had a colonoscopy in about 15 years.  The patient also has anemia and will be set up for an EGD and colonoscopy.  The patient has also been told to titrate MiraLAX with a daily dose so that she is having a bowel movement every day to every other day.  The patient's iron studies have improved with her iron supplementation.  The patient has been explained the plan and agrees with it.   Midge Minium, MD. Clementeen Graham    Note: This dictation was prepared with Dragon dictation along with smaller phrase technology. Any transcriptional errors that result from this process are unintentional.

## 2023-01-26 ENCOUNTER — Telehealth: Payer: Self-pay | Admitting: Family Medicine

## 2023-01-26 NOTE — Telephone Encounter (Signed)
Patient called states that her GI doctor has instructed her not to do the cologuard. She states that she received the cologuard in the mail today and doesn't know what to do with it.  CB# [phone]

## 2023-01-27 ENCOUNTER — Encounter: Payer: 59 | Admitting: Physician Assistant

## 2023-01-27 DIAGNOSIS — L97512 Non-pressure chronic ulcer of other part of right foot with fat layer exposed: Secondary | ICD-10-CM | POA: Diagnosis not present

## 2023-01-27 DIAGNOSIS — M545 Low back pain, unspecified: Secondary | ICD-10-CM | POA: Diagnosis not present

## 2023-01-27 DIAGNOSIS — G603 Idiopathic progressive neuropathy: Secondary | ICD-10-CM | POA: Diagnosis not present

## 2023-01-27 DIAGNOSIS — R531 Weakness: Secondary | ICD-10-CM | POA: Diagnosis not present

## 2023-01-27 DIAGNOSIS — N183 Chronic kidney disease, stage 3 unspecified: Secondary | ICD-10-CM | POA: Diagnosis not present

## 2023-01-27 DIAGNOSIS — M869 Osteomyelitis, unspecified: Secondary | ICD-10-CM | POA: Diagnosis not present

## 2023-01-27 DIAGNOSIS — G4733 Obstructive sleep apnea (adult) (pediatric): Secondary | ICD-10-CM | POA: Diagnosis not present

## 2023-01-27 DIAGNOSIS — I130 Hypertensive heart and chronic kidney disease with heart failure and stage 1 through stage 4 chronic kidney disease, or unspecified chronic kidney disease: Secondary | ICD-10-CM | POA: Diagnosis not present

## 2023-01-27 DIAGNOSIS — R7303 Prediabetes: Secondary | ICD-10-CM | POA: Diagnosis not present

## 2023-01-27 DIAGNOSIS — I5042 Chronic combined systolic (congestive) and diastolic (congestive) heart failure: Secondary | ICD-10-CM | POA: Diagnosis not present

## 2023-01-27 DIAGNOSIS — G894 Chronic pain syndrome: Secondary | ICD-10-CM | POA: Diagnosis not present

## 2023-01-27 NOTE — Progress Notes (Signed)
Name: Date of Service: [name] [name], [name] [name] 01/27/2023 12:15 PM Medical Record Number: [id number] Patient Account Number: [id number] Date of Birth/Sex: Treating RN: [name] (57 y.o. [name] [name] Primary Care [name] [name]: [name] [name] Other Clinician: Referring Inna Tisdell: Treating [name] [name]/Extender: [name] [name] in Treatment: 2 Encounter Discharge Information Items Discharge Condition: [name] [name], [name] [name] ([id number]) [id number] Page 3 of 8 Ambulatory Status: Walker Discharge Destination: Home Transportation: Private Auto Accompanied By: self Schedule Follow-up Appointment: Yes Clinical Summary of Care: Electronic Signature(s) Signed: 01/27/2023 4:08:08 PM By: Midge Aver MSN RN CNS WTA Entered By: Midge Aver on 01/27/2023 13:08:09 -------------------------------------------------------------------------------- Lower Extremity Assessment Details Patient Name: Date of Service: [name] [name] 01/27/2023 12:15 PM Medical Record Number: [id number] Patient Account Number: [id number] Date of Birth/Sex: Treating RN: [name] [name] (57 y.o. [name] [name] Primary Care [name] [name]: [name] [name] Other Clinician: Referring Lanett Lasorsa: Treating [name] [name]/Extender: [name] [name] Weeks in Treatment: 2 Electronic Signature(s) Signed: 01/27/2023 4:08:08 PM By: Midge Aver MSN RN CNS WTA Entered By: Midge Aver on 01/27/2023 12:38:00 -------------------------------------------------------------------------------- Multi Wound Chart Details Patient Name: Date of Service: [name] [name] 01/27/2023 12:15 PM Medical Record Number: [id number] Patient Account  Number: [id number] Date of Birth/Sex: Treating RN: [name] [name] (57 y.o. 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 Primary Etiology: [name] [name] N/A N/A Comorbid History: Heart Failure, Hypertension, Seizure Disorder 12/06/2022 N/A N/A Date Acquired: 2 N/A N/A Weeks of Treatment: Open N/A N/A Wound Status: No N/A N/A Wound Recurrence: 1x0.8x0.2 N/A N/A Measurements L x W x D (cm) 0.628 N/A N/A A (cm) : rea 0.126 N/A N/A Volume (cm) : -63.10% N/A N/A % Reduction in Area: -63.60% N/A N/A % Reduction in Volume: Full Thickness Without Exposed N/A N/A Classification: Support Structures Medium N/A N/A Exudate Amount: Serosanguineous N/A N/A Exudate Type: red, brown N/A N/A Exudate Color: Medium (34-66%) N/A N/A Granulation Amount: Red, Pink N/A N/A Granulation Quality: Fat Layer (Subcutaneous Tissue): Yes N/A N/A Exposed Structures: Fascia: No Tendon: No Muscle: No Joint: No Bone: No Treatment Notes Electronic Signature(s) Signed: 01/27/2023 4:08:08 PM By: Midge Aver MSN RN CNS WTA Entered By: Midge Aver on 01/27/2023 12:42:10 -------------------------------------------------------------------------------- Multi-Disciplinary Care Plan Details Patient Name: Date of Service: [name] Cloud D. 01/27/2023 12:15 PM Medical Record Number: [id number] Patient Account Number: [id number] Date of Birth/Sex: Treating RN: [name] (57 y.o. [name] [name] Primary Care Marley Pakula: [name] [name] Other Clinician: Referring [name] [name]: Treating [name] [name]/Extender: [name] [name] in Treatment: 2 Active Inactive Necrotic Tissue Nursing Diagnoses: Impaired tissue integrity related to necrotic/devitalized tissue Knowledge deficit related to management of necrotic/devitalized tissue Goals: Necrotic/devitalized tissue will be minimized in the wound bed Date Initiated: 01/13/2023 Target Resolution Date: 02/12/2023 Goal Status: Active [name] [name], [name] [name] ([id number]) [phone].pdf Page 5 of 8 Patient/caregiver will verbalize understanding of reason and process for debridement of necrotic tissue Date Initiated: 01/13/2023 Target Resolution Date: 02/12/2023 Goal Status: Active Interventions: Assess patient pain level pre-, during and post procedure and prior to discharge Provide education on necrotic tissue and debridement process Treatment Activities: Apply topical anesthetic as ordered : 01/13/2023 Excisional debridement : 01/13/2023 Notes: Wound/Skin Impairment Nursing Diagnoses: Impaired tissue integrity Knowledge deficit related to  Gently cleanse wound with antibacterial soap, rinse and pat dry prior to dressing wounds Peri-Wound Care Topical Primary Dressing Hydrofera Blue Ready Transfer Foam, 2.5x2.5 (in/in) Discharge Instruction: Apply Hydrofera Blue Ready to wound bed as directed Secondary Dressing ABD Pad 5x9 (in/in) Discharge Instruction: Cover with ABD pad Secured With Medipore T - 59M Medipore H Soft Cloth Surgical T ape ape, 2x2 (in/yd) Kerlix Roll Sterile or Non-Sterile 6-ply 4.5x4 (yd/yd) Discharge Instruction: Apply Kerlix as directed Compression Wrap Compression Stockings Add-Ons Electronic Signature(s) Signed: 01/27/2023 4:08:08 PM By: Midge Aver MSN RN CNS WTA Entered By: Midge Aver on 01/27/2023 12:37:49 -------------------------------------------------------------------------------- Vitals Details Patient Name: Date of Service: [name] Cloud D. 01/27/2023 12:15 PM Medical Record Number: [id number] Patient Account Number: [id number] Date of Birth/Sex: Treating RN: [birth year] [name] (57 y.o. [name] [name] Primary Care [name] [name]: [name] [name] Other Clinician: Referring [name] [name]: Treating [name] [name]/Extender: [name] [name] in Treatment: 2 Vital Signs Time Taken: 12:27 Temperature (F): 98.0 Height (in): 64 Pulse (bpm): 86 Weight (lbs): 235 Respiratory Rate (breaths/min): 18 Body Mass Index (BMI): 40.3 Blood Pressure (mmHg): 134/80 Reference Range: 80 - 120 mg / dl Electronic Signature(s) Signed: 01/27/2023 4:08:08 PM By: Midge Aver MSN RN CNS WTA Entered By: Midge Aver on 01/27/2023 12:27:23  Name: Date of Service: [name] [name], [name] [name] 01/27/2023 12:15 PM Medical Record Number: [id number] Patient Account Number: [id number] Date of Birth/Sex: Treating RN: [name] (57 y.o. [name] [name] Primary Care [name] [name]: [name] [name] Other Clinician: Referring Inna Tisdell: Treating [name] [name]/Extender: [name] [name] in Treatment: 2 Encounter Discharge Information Items Discharge Condition: [name] [name], [name] [name] ([id number]) [id number] Page 3 of 8 Ambulatory Status: Walker Discharge Destination: Home Transportation: Private Auto Accompanied By: self Schedule Follow-up Appointment: Yes Clinical Summary of Care: Electronic Signature(s) Signed: 01/27/2023 4:08:08 PM By: Midge Aver MSN RN CNS WTA Entered By: Midge Aver on 01/27/2023 13:08:09 -------------------------------------------------------------------------------- Lower Extremity Assessment Details Patient Name: Date of Service: [name] [name] 01/27/2023 12:15 PM Medical Record Number: [id number] Patient Account Number: [id number] Date of Birth/Sex: Treating RN: [name] [name] (57 y.o. [name] [name] Primary Care [name] [name]: [name] [name] Other Clinician: Referring Lanett Lasorsa: Treating [name] [name]/Extender: [name] [name] Weeks in Treatment: 2 Electronic Signature(s) Signed: 01/27/2023 4:08:08 PM By: Midge Aver MSN RN CNS WTA Entered By: Midge Aver on 01/27/2023 12:38:00 -------------------------------------------------------------------------------- Multi Wound Chart Details Patient Name: Date of Service: [name] [name] 01/27/2023 12:15 PM Medical Record Number: [id number] Patient Account  Number: [id number] Date of Birth/Sex: Treating RN: [name] [name] (57 y.o. 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 Primary Etiology: [name] [name] N/A N/A Comorbid History: Heart Failure, Hypertension, Seizure Disorder 12/06/2022 N/A N/A Date Acquired: 2 N/A N/A Weeks of Treatment: Open N/A N/A Wound Status: No N/A N/A Wound Recurrence: 1x0.8x0.2 N/A N/A Measurements L x W x D (cm) 0.628 N/A N/A A (cm) : rea 0.126 N/A N/A Volume (cm) : -63.10% N/A N/A % Reduction in Area: -63.60% N/A N/A % Reduction in Volume: Full Thickness Without Exposed N/A N/A Classification: Support Structures Medium N/A N/A Exudate Amount: Serosanguineous N/A N/A Exudate Type: red, brown N/A N/A Exudate Color: Medium (34-66%) N/A N/A Granulation Amount: Red, Pink N/A N/A Granulation Quality: Fat Layer (Subcutaneous Tissue): Yes N/A N/A Exposed Structures: Fascia: No Tendon: No Muscle: No Joint: No Bone: No Treatment Notes Electronic Signature(s) Signed: 01/27/2023 4:08:08 PM By: Midge Aver MSN RN CNS WTA Entered By: Midge Aver on 01/27/2023 12:42:10 -------------------------------------------------------------------------------- Multi-Disciplinary Care Plan Details Patient Name: Date of Service: [name] Cloud D. 01/27/2023 12:15 PM Medical Record Number: [id number] Patient Account Number: [id number] Date of Birth/Sex: Treating RN: [birth year] [name] (57 y.o. [name] [name] Primary Care Marley Pakula: [name] [name] Other Clinician: Referring [name] [name]: Treating [name] [name]/Extender: [name] [name] in Treatment: 2 Active Inactive Necrotic Tissue Nursing Diagnoses: Impaired tissue integrity related to necrotic/devitalized tissue Knowledge deficit related to management of necrotic/devitalized tissue Goals: Necrotic/devitalized tissue will be minimized in the wound bed Date Initiated: 01/13/2023 Target Resolution Date: 02/12/2023 Goal Status: Active [name] [name], [name] [name] ([id number]) [phone].pdf Page 5 of 8 Patient/caregiver will verbalize understanding of reason and process for debridement of necrotic tissue Date Initiated: 01/13/2023 Target Resolution Date: 02/12/2023 Goal Status: Active Interventions: Assess patient pain level pre-, during and post procedure and prior to discharge Provide education on necrotic tissue and debridement process Treatment Activities: Apply topical anesthetic as ordered : 01/13/2023 Excisional debridement : 01/13/2023 Notes: Wound/Skin Impairment Nursing Diagnoses: Impaired tissue integrity Knowledge deficit related to  ulceration/compromised skin integrity Goals: Patient/caregiver will verbalize understanding of skin care regimen Date Initiated: 01/13/2023 Target Resolution Date: 02/12/2023 Goal Status: Active Ulcer/skin breakdown will have a volume reduction of 30% by week 4 Date Initiated: 01/13/2023 Target Resolution Date: 02/12/2023 Goal Status: Active Ulcer/skin breakdown will have a volume reduction of 50% by week 8 Date Initiated: 01/13/2023 Target Resolution Date: 03/15/2023 Goal Status: Active Ulcer/skin breakdown will have a volume reduction of 80% by week 12 Date Initiated: 01/13/2023 Target Resolution Date: 04/14/2023 Goal Status: Active Interventions: Assess patient/caregiver ability to obtain necessary supplies Assess patient/caregiver ability to perform ulcer/skin care regimen upon admission and as  needed Assess ulceration(s) every visit Provide education on ulcer and skin care Treatment Activities: Referred to DME Elaf Clauson for dressing supplies : 01/13/2023 Notes: Electronic Signature(s) Signed: 01/27/2023 4:08:08 PM By: Midge Aver MSN RN CNS WTA Entered By: Midge Aver on 01/27/2023 13:07:04 -------------------------------------------------------------------------------- Pain Assessment Details Patient Name: Date of Service: [name] Cloud D. 01/27/2023 12:15 PM Medical Record Number: [id number] Patient Account Number: [id number] Date of Birth/Sex: Treating RN: [name] (57 y.o. [name] [name] Primary Care Quetzally Callas: [name] [name] Other Clinician: Referring Erikah Thumm: Treating Wynell Halberg/Extender: [name] [name] Weeks in Treatment: 2 Active Problems Location of Pain Severity and Description of Pain Patient Has Paino No [name] [name], [name] [name] ([id number]) [id number] Page 6 of 8 Patient Has Paino No Site Locations Pain Management and Medication Current Pain Management: Electronic Signature(s) Signed: 01/27/2023 4:08:08 PM By: Midge Aver MSN RN CNS WTA Entered By: Midge Aver on 01/27/2023 12:27:29 -------------------------------------------------------------------------------- Patient/Caregiver Education Details Patient Name: Date of Service: [name] [name] 10/10/2024andnbsp12:15 PM Medical Record Number: [id number] Patient Account Number: [id number] Date of Birth/Gender: Treating RN: [birth year] (57 y.o. [name] [name] Primary Care Physician: [name] [name] Other Clinician: Referring Physician: Treating Physician/Extender: [name] [name] in Treatment: 2 Education Assessment Education Provided To: Patient Education Topics Provided Wound/Skin Impairment: Handouts: Caring for Your Ulcer Methods: Explain/Verbal Responses: State content correctly Electronic Signature(s) Signed: 01/27/2023 4:08:08 PM  By: Midge Aver MSN RN CNS WTA Entered By: Midge Aver on 01/27/2023 13:07:17 [name] [name] ([id number]) [id number] Page 7 of 8 -------------------------------------------------------------------------------- Wound Assessment Details Patient Name: Date of Service: [name] [name], [name] [name] 01/27/2023 12:15 PM Medical Record Number: [id number] Patient Account Number: [id number] Date of Birth/Sex: Treating RN: [name][birth year] (57 y.o. 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, Plantar, Posterior Foot Wound Open Wounding Event: Gradually Appeared Status: Date Acquired: 12/06/2022 Comorbid [name] [name] Heart Failure, Hypertension, Weeks Of Treatment: 2 History: Seizure Disorder Clustered Wound: No Photos Wound Measurements Length: (cm) 1 Width: (cm) 0.8 Depth: (cm) 0.2 Area: (cm) 0.628 Volume: (cm) 0.126 % Reduction in Area: -63.1% % Reduction in Volume: -63.6% Wound Description Classification: Full Thickness Without Exposed Support Structures Exudate Amount: Medium Exudate Type: Serosanguineous Exudate Color: red, brown Foul Odor After Cleansing: No Slough/Fibrino No Wound Bed Granulation Amount: Medium (34-66%) Exposed Structure Granulation Quality: Red, Pink Fascia Exposed: No Fat Layer (Subcutaneous Tissue) Exposed: Yes Tendon Exposed: No Muscle Exposed: No Joint Exposed: No Bone Exposed: No Treatment Notes Wound #1 (Foot) Wound Laterality: Plantar, Right, Posterior Cleanser [employer] Ancillary Kit - 15 Day Supply Discharge Instruction: Use supplies as instructed; Kit contains: (15) Saline Bullets; (15) 3x3 Gauze; 15 pr Gloves Soap and [address] [name] [name], [name] [name] D ([id number])  [phone].pdf Page 8 of 8 Discharge Instruction:

## 2023-01-27 NOTE — Progress Notes (Addendum)
MCKENLEIGH, TARLTON ([id number]) [id number] Page 1 of 7 Visit Report for 01/27/2023 Chief Complaint Document Details Patient Name: Date of Service: [name] [name], [name] [name] 01/27/2023 12:15 PM Medical Record Number: [id number] Patient Account Number: [id number] Date of Birth/Sex: Treating RN: May 03, 1965 (58 y.o. [name] Primary Care Provider: Lynnea Ferrier Other Clinician: Referring Provider: Treating Provider/Extender: Trula Ore in Treatment: 2 Information Obtained from: Patient Chief Complaint Right plantar foot ulcer Electronic Signature(s) Signed: 01/27/2023 12:30:58 PM By: Allen Derry PA-C Entered By: Allen Derry on 01/27/2023 12:30:58 -------------------------------------------------------------------------------- HPI Details Patient Name: Date of Service: [name] Cloud [name]. 01/27/2023 12:15 PM Medical Record Number: [id number] Patient Account Number: [id number] Date of Birth/Sex: Treating RN: 04-Aug-1965 (57 y.o. [name] Primary Care Provider: Lynnea Ferrier Other Clinician: Referring Provider: Treating Provider/Extender: Wilma Flavin Weeks in Treatment: 2 History of Present Illness Chronic/Inactive Conditions Condition 1: 01-13-2023 patient's ABI on the left was 1.14 and on the right was 1.01 on screening today here in the clinic HPI Description: 01-13-2023 upon evaluation patient presents for initial inspection here in our clinic concerning an issue that actually occurring over the distal/plantar aspect of her right foot. 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 She tells me this started about a month ago encounter gradually appeared there was no inciting event. With that being said the patient tells me that she has been using Silvadene as prescribed by Dr. Logan Bores. This has not really helped much with getting the wound to heal. She has  previously had total contact casting when she was in [place] and that something that she states she did very well with. Actually think that is probably good to be the way to go to be perfectly honest. Patient does have a history of hypertension, congestive heart failure, chronic kidney disease stage III, and again prediabetes although she is not on any oral medication this is just being monitored at this point. 01-20-2023 upon evaluation today patient appears to be doing well currently in regard to her wound. Her foot ulcer actually showing signs of improvement which I feel like is excellent news. I do not see any signs of worsening overall and I do believe that the patient is making excellent headway towards complete closure which is wonderful news. 01-25-2023 upon evaluation today patient appears to be doing well currently in regard to her wound although it is not significantly smaller today compared to what it was previous. I do believe initiating total contact cast has been to be appropriate at this point. NYEEMA, WANT ([id number]) [id number] Page 2 of 7 01-27-2023 upon evaluation today patient appears to be doing well with regard to the cast. She has been tolerating the dressing changes without complication. Fortunately I do not see any evidence of active infection locally or systemically which is great news. No fevers, chills, nausea, vomiting, or diarrhea. Electronic Signature(s) Signed: 01/27/2023 12:45:21 PM By: Allen Derry PA-C Entered By: Allen Derry on 01/27/2023 12:45:21 -------------------------------------------------------------------------------- Physical Exam Details Patient Name: Date of Service: [name] [name], [name] [name] 01/27/2023 12:15 PM Medical Record Number: [id number] Patient Account Number: [id number] Date of Birth/Sex: Treating RN: 22-Dec-1965 (57 y.o. [name] Primary Care Provider: Lynnea Ferrier Other Clinician: Referring  Provider: Treating Provider/Extender: Wilma Flavin Weeks in Treatment: 2 Constitutional Well-nourished and well-hydrated in no acute distress. Respiratory normal breathing without difficulty. Psychiatric this patient is able to make decisions and demonstrates good insight into disease process. Alert and Oriented  on the Right,Plantar,Posterior Foot. The wound measures 1cm length x 0.8cm width x 0.2cm depth; 0.628cm^2 area and 0.126cm^3 volume. There is Fat Layer (Subcutaneous Tissue) exposed. There is a medium amount of serosanguineous drainage noted. There is medium (34-66%) red, pink granulation within the wound bed. 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 There was a T Contact Cast otal Procedure by Allen Derry, PA-C. Post procedure Diagnosis Wound #1: Same as Pre-Procedure Notes: Cast applied to the right foot. Plan 1. I would recommend that we have the patient continue to monitor for any signs of infection or worsening the only reason the wound looked bigger today was simply due to the fact that we actually had  to do some debridement at the last visit therefore the postdebridement measurements were bigger than the prior to debridement measurements and therefore just 2 days later this is still a bit larger. Nonetheless in general I think this looks much better. 2. I am going to recommend that we have the patient continue to use the total contact casting I did reapply that today. 3. I am also going to recommend that she should continue to monitor for any signs of infection or worsening. We will see patient back for reevaluation in 1 week here in the clinic. If anything worsens or changes patient will contact our office for additional [name] [name], [name] [name] ([id number]) [id number] Page 6 of 7 recommendations. Electronic Signature(s) Signed: 01/27/2023 12:47:23 PM By: Allen Derry PA-C Previous Signature: 01/27/2023 12:46:28 PM Version By: Allen Derry PA-C Entered By: Allen Derry on 01/27/2023 12:47:23 -------------------------------------------------------------------------------- Total Contact Cast Details Patient Name: Date of Service: [name] [name], [name] [name] 01/27/2023 12:15 PM Medical Record Number: [id number] Patient Account Number: [id number] Date of Birth/Sex: Treating RN: Oct 03, 1965 (57 y.o. [name] Primary Care Provider: Lynnea Ferrier Other Clinician: Referring Provider: Treating Provider/Extender: Trula Ore in Treatment: 2 T Contact Cast Applied for Wound Assessment: otal Wound #1 Right,Plantar,Posterior Foot Performed By: Physician Allen Derry, PA-C Post Procedure Diagnosis Same as Pre-procedure Notes Cast applied to the right foot Electronic Signature(s) Signed: 01/27/2023 12:47:11 PM By: Allen Derry PA-C Entered By: Allen Derry on 01/27/2023 12:47:11 -------------------------------------------------------------------------------- SuperBill Details Patient Name: Date of Service: [name] [name] 01/27/2023 Medical Record  Number: [id number] Patient Account Number: [id number] Date of Birth/Sex: Treating RN: January 25, 1966 (57 y.o. [name] Primary Care Provider: Lynnea Ferrier Other Clinician: Referring Provider: Treating Provider/Extender: Wilma Flavin Weeks in Treatment: 2 Diagnosis Coding ICD-10 Codes Code Description G60.3 Idiopathic progressive neuropathy L97.512 Non-pressure chronic ulcer of other part of right foot with fat layer exposed I10 Essential (primary) hypertension [name] [name], [name] [name] ([id number]) [id number] Page 7 of 7 I50.42 Chronic combined systolic (congestive) and diastolic (congestive) heart failure N18.30 Chronic kidney disease, stage 3 unspecified R73.03 Prediabetes Facility Procedures : CPT4 Code: 62952841 Description: [phone] - APPLY TOTAL CONTACT LEG CAST ICD-10 Diagnosis Description L97.512 Non-pressure chronic ulcer of other part of right foot with fat layer exposed Modifier: Quantity: 1 Physician Procedures : CPT4 Code Description Modifier [phone] - WC PHYS APPLY TOTAL CONTACT CAST ICD-10 Diagnosis Description L97.512 Non-pressure chronic ulcer of other part of right foot with fat layer exposed Quantity: 1 Electronic Signature(s) Signed: 01/27/2023  on the Right,Plantar,Posterior Foot. The wound measures 1cm length x 0.8cm width x 0.2cm depth; 0.628cm^2 area and 0.126cm^3 volume. There is Fat Layer (Subcutaneous Tissue) exposed. There is a medium amount of serosanguineous drainage noted. There is medium (34-66%) red, pink granulation within the wound bed. 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 There was a T Contact Cast otal Procedure by Allen Derry, PA-C. Post procedure Diagnosis Wound #1: Same as Pre-Procedure Notes: Cast applied to the right foot. Plan 1. I would recommend that we have the patient continue to monitor for any signs of infection or worsening the only reason the wound looked bigger today was simply due to the fact that we actually had  to do some debridement at the last visit therefore the postdebridement measurements were bigger than the prior to debridement measurements and therefore just 2 days later this is still a bit larger. Nonetheless in general I think this looks much better. 2. I am going to recommend that we have the patient continue to use the total contact casting I did reapply that today. 3. I am also going to recommend that she should continue to monitor for any signs of infection or worsening. We will see patient back for reevaluation in 1 week here in the clinic. If anything worsens or changes patient will contact our office for additional [name] [name], [name] [name] ([id number]) [id number] Page 6 of 7 recommendations. Electronic Signature(s) Signed: 01/27/2023 12:47:23 PM By: Allen Derry PA-C Previous Signature: 01/27/2023 12:46:28 PM Version By: Allen Derry PA-C Entered By: Allen Derry on 01/27/2023 12:47:23 -------------------------------------------------------------------------------- Total Contact Cast Details Patient Name: Date of Service: [name] [name], [name] [name] 01/27/2023 12:15 PM Medical Record Number: [id number] Patient Account Number: [id number] Date of Birth/Sex: Treating RN: Oct 03, 1965 (57 y.o. [name] Primary Care Provider: Lynnea Ferrier Other Clinician: Referring Provider: Treating Provider/Extender: Trula Ore in Treatment: 2 T Contact Cast Applied for Wound Assessment: otal Wound #1 Right,Plantar,Posterior Foot Performed By: Physician Allen Derry, PA-C Post Procedure Diagnosis Same as Pre-procedure Notes Cast applied to the right foot Electronic Signature(s) Signed: 01/27/2023 12:47:11 PM By: Allen Derry PA-C Entered By: Allen Derry on 01/27/2023 12:47:11 -------------------------------------------------------------------------------- SuperBill Details Patient Name: Date of Service: [name] [name] 01/27/2023 Medical Record  Number: [id number] Patient Account Number: [id number] Date of Birth/Sex: Treating RN: January 25, 1966 (57 y.o. [name] Primary Care Provider: Lynnea Ferrier Other Clinician: Referring Provider: Treating Provider/Extender: Wilma Flavin Weeks in Treatment: 2 Diagnosis Coding ICD-10 Codes Code Description G60.3 Idiopathic progressive neuropathy L97.512 Non-pressure chronic ulcer of other part of right foot with fat layer exposed I10 Essential (primary) hypertension [name] [name], [name] [name] ([id number]) [id number] Page 7 of 7 I50.42 Chronic combined systolic (congestive) and diastolic (congestive) heart failure N18.30 Chronic kidney disease, stage 3 unspecified R73.03 Prediabetes Facility Procedures : CPT4 Code: 62952841 Description: [phone] - APPLY TOTAL CONTACT LEG CAST ICD-10 Diagnosis Description L97.512 Non-pressure chronic ulcer of other part of right foot with fat layer exposed Modifier: Quantity: 1 Physician Procedures : CPT4 Code Description Modifier [phone] - WC PHYS APPLY TOTAL CONTACT CAST ICD-10 Diagnosis Description L97.512 Non-pressure chronic ulcer of other part of right foot with fat layer exposed Quantity: 1 Electronic Signature(s) Signed: 01/27/2023  x 3. pleasant and cooperative. Notes Upon inspection patient's wound bed actually showed signs of good granulation and epithelization at this point. Fortunately I do not see any evidence of worsening in general and I do believe that the patient is making good headway closure which is great news. I think the casting towards is going to do well for her and we will get a go and reapply that today. Electronic Signature(s) Signed: 01/27/2023 12:45:41 PM By: Allen Derry PA-C Entered By: Allen Derry on 01/27/2023 12:45:41 -------------------------------------------------------------------------------- Physician Orders Details Patient Name: Date of Service: [name] [name] 01/27/2023 12:15 PM Medical Record Number: [id number] Patient Account Number: [id number] Date of Birth/Sex: Treating RN: October 14, 1965 (57 y.o. [name] Primary Care Provider: Lynnea Ferrier Other Clinician: Referring Provider: Treating Provider/Extender: Trula Ore in Treatment: 2 Verbal / Phone Orders: No Diagnosis Coding [name] [name], [name] [name] ([id number]) [id number] Page 3 of 7 ICD-10 Coding Code Description G60.3 Idiopathic progressive neuropathy L97.512 Non-pressure chronic ulcer of other part of right foot with fat layer exposed I10 Essential (primary) hypertension I50.42 Chronic combined systolic (congestive) and diastolic (congestive) heart failure N18.30 Chronic kidney disease, stage 3 unspecified R73.03 Prediabetes Follow-up Appointments Return Appointment in 1 week. Bathing/ [employer] wounds with antibacterial soap  and water. May shower; gently cleanse wound with antibacterial soap, rinse and pat dry prior to dressing wounds Anesthetic (Use 'Patient Medications' Section for Anesthetic Order Entry) Lidocaine applied to wound bed Off-Loading Total Contact Cast to Right Lower Extremity - Initiated on 01/25/2023 Wound Treatment Wound #1 - Foot Wound Laterality: Plantar, Right, Posterior Cleanser: Byram Ancillary Kit - 15 Day Supply (Generic) 3 x Per Week/30 Days Discharge Instructions: Use supplies as instructed; Kit contains: (15) Saline Bullets; (15) 3x3 Gauze; 15 pr Gloves Cleanser: Soap and Water 3 x Per Week/30 Days Discharge Instructions: Gently cleanse wound with antibacterial soap, rinse and pat dry prior to dressing wounds Prim Dressing: Hydrofera Blue Ready Transfer Foam, 2.5x2.5 (in/in) (Dispense As Written) 3 x Per Week/30 Days ary Discharge Instructions: Apply Hydrofera Blue Ready to wound bed as directed Secondary Dressing: ABD Pad 5x9 (in/in) (Generic) 3 x Per Week/30 Days Discharge Instructions: Cover with ABD pad Secured With: Medipore T - 45M Medipore H Soft Cloth Surgical T ape ape, 2x2 (in/yd) (Generic) 3 x Per Week/30 Days Secured With: [employer] Sterile or Non-Sterile 6-ply 4.5x4 (yd/yd) (Generic) 3 x Per Week/30 Days Discharge Instructions: Apply Kerlix as directed Electronic Signature(s) Signed: 01/27/2023 4:08:08 PM By: Midge Aver MSN RN CNS WTA Signed: 01/27/2023 4:17:03 PM By: Allen Derry PA-C Entered By: Midge Aver on 01/27/2023 13:06:40 -------------------------------------------------------------------------------- Problem List Details Patient Name: Date of Service: [name] Cloud [name]. 01/27/2023 12:15 PM Medical Record Number: [id number] Patient Account Number: [id number] Date of Birth/Sex: Treating RN: 1965-12-31 (57 y.o. [name] Primary Care Provider: Lynnea Ferrier Other Clinician: Referring Provider: Treating Provider/Extender: Trula Ore in Treatment: 2 Active Problems ICD-10 [name] [name], [name] [name] ([id number]) [id number] Page 4 of 7 Encounter Code Description Active Date MDM Diagnosis G60.3 Idiopathic progressive neuropathy 01/13/2023 No Yes L97.512 Non-pressure chronic ulcer of other part of right foot with fat layer exposed 01/13/2023 No Yes I10 Essential (primary) hypertension 01/13/2023 No Yes I50.42 Chronic combined systolic (congestive) and diastolic (congestive) heart failure 01/13/2023 No Yes N18.30 Chronic kidney disease, stage 3 unspecified 01/13/2023 No Yes R73.03 Prediabetes 01/13/2023 No Yes Inactive Problems Resolved Problems Electronic Signature(s) Signed: 01/27/2023 4:08:08 PM By: Midge Aver MSN RN CNS WTA Signed: 01/27/2023 4:17:03  x 3. pleasant and cooperative. Notes Upon inspection patient's wound bed actually showed signs of good granulation and epithelization at this point. Fortunately I do not see any evidence of worsening in general and I do believe that the patient is making good headway closure which is great news. I think the casting towards is going to do well for her and we will get a go and reapply that today. Electronic Signature(s) Signed: 01/27/2023 12:45:41 PM By: Allen Derry PA-C Entered By: Allen Derry on 01/27/2023 12:45:41 -------------------------------------------------------------------------------- Physician Orders Details Patient Name: Date of Service: [name] [name] 01/27/2023 12:15 PM Medical Record Number: [id number] Patient Account Number: [id number] Date of Birth/Sex: Treating RN: October 14, 1965 (57 y.o. [name] Primary Care Provider: Lynnea Ferrier Other Clinician: Referring Provider: Treating Provider/Extender: Trula Ore in Treatment: 2 Verbal / Phone Orders: No Diagnosis Coding [name] [name], [name] [name] ([id number]) [id number] Page 3 of 7 ICD-10 Coding Code Description G60.3 Idiopathic progressive neuropathy L97.512 Non-pressure chronic ulcer of other part of right foot with fat layer exposed I10 Essential (primary) hypertension I50.42 Chronic combined systolic (congestive) and diastolic (congestive) heart failure N18.30 Chronic kidney disease, stage 3 unspecified R73.03 Prediabetes Follow-up Appointments Return Appointment in 1 week. Bathing/ [employer] wounds with antibacterial soap  and water. May shower; gently cleanse wound with antibacterial soap, rinse and pat dry prior to dressing wounds Anesthetic (Use 'Patient Medications' Section for Anesthetic Order Entry) Lidocaine applied to wound bed Off-Loading Total Contact Cast to Right Lower Extremity - Initiated on 01/25/2023 Wound Treatment Wound #1 - Foot Wound Laterality: Plantar, Right, Posterior Cleanser: [employer]ram Ancillary Kit - 15 Day Supply (Generic) 3 x Per Week/30 Days Discharge Instructions: Use supplies as instructed; Kit contains: (15) Saline Bullets; (15) 3x3 Gauze; 15 pr Gloves Cleanser: Soap and Water 3 x Per Week/30 Days Discharge Instructions: Gently cleanse wound with antibacterial soap, rinse and pat dry prior to dressing wounds Prim Dressing: Hydrofera Blue Ready Transfer Foam, 2.5x2.5 (in/in) (Dispense As Written) 3 x Per Week/30 Days ary Discharge Instructions: Apply Hydrofera Blue Ready to wound bed as directed Secondary Dressing: ABD Pad 5x9 (in/in) (Generic) 3 x Per Week/30 Days Discharge Instructions: Cover with ABD pad Secured With: Medipore T - 45M Medipore H Soft Cloth Surgical T ape ape, 2x2 (in/yd) (Generic) 3 x Per Week/30 Days Secured With: [employer] Sterile or Non-Sterile 6-ply 4.5x4 (yd/yd) (Generic) 3 x Per Week/30 Days Discharge Instructions: Apply Kerlix as directed Electronic Signature(s) Signed: 01/27/2023 4:08:08 PM By: Midge Aver MSN RN CNS WTA Signed: 01/27/2023 4:17:03 PM By: Allen Derry PA-C Entered By: Midge Aver on 01/27/2023 13:06:40 -------------------------------------------------------------------------------- Problem List Details Patient Name: Date of Service: [name] Cloud [name]. 01/27/2023 12:15 PM Medical Record Number: [id number] Patient Account Number: [id number] Date of Birth/Sex: Treating RN: 1965-12-31 (57 y.o. [name] Primary Care Provider: Lynnea Ferrier Other Clinician: Referring Provider: Treating Provider/Extender: Trula Ore in Treatment: 2 Active Problems ICD-10 [name] [name], [name] [name] ([id number]) [id number] Page 4 of 7 Encounter Code Description Active Date MDM Diagnosis G60.3 Idiopathic progressive neuropathy 01/13/2023 No Yes L97.512 Non-pressure chronic ulcer of other part of right foot with fat layer exposed 01/13/2023 No Yes I10 Essential (primary) hypertension 01/13/2023 No Yes I50.42 Chronic combined systolic (congestive) and diastolic (congestive) heart failure 01/13/2023 No Yes N18.30 Chronic kidney disease, stage 3 unspecified 01/13/2023 No Yes R73.03 Prediabetes 01/13/2023 No Yes Inactive Problems Resolved Problems Electronic Signature(s) Signed: 01/27/2023 4:08:08 PM By: Midge Aver MSN RN CNS WTA Signed: 01/27/2023 4:17:03

## 2023-02-01 ENCOUNTER — Ambulatory Visit: Payer: Self-pay | Admitting: *Deleted

## 2023-02-01 NOTE — Patient Instructions (Addendum)
Visit Information  Thank you for taking time to visit with me today. Please don't hesitate to contact me if I can be of assistance to you.   Following are the goals we discussed today:   Goals Addressed             This Visit's Progress    Home management of home health services, Congestive heart failure/infected stump ulcer- care coordination services       Interventions Today    Flowsheet Row Most Recent Value  Chronic Disease   Chronic disease during today's visit Congestive Heart Failure (CHF), Other  [home health PT services, confirmed all transportation needs met. Offer to assist with [facility] DSS transportation not needed at this time- confirmed no worsening symptoms to include CHF swelling, Shortness of breath, Confirms she has a cast]  General Interventions   General Interventions Discussed/Reviewed General Interventions Reviewed, [facility], Doctor Visits, Communication with  Doctor Visits Discussed/Reviewed Doctor Visits Reviewed, PCP, Specialist  [continue with wound care visits]  PCP/Specialist Visits Compliance with follow-up visit  Communication with PCP/Specialists  [home health PT services via [facility]]  Exercise Interventions   Exercise Discussed/Reviewed Exercise Reviewed, Physical Activity  [decreased activity]  Physical Activity Discussed/Reviewed Physical Activity Reviewed  [minimal activity]  Mental Health Interventions   Mental Health Discussed/Reviewed Mental Health Reviewed, Coping Strategies              Our next appointment is by telephone on 03/04/23 at 2 pm  Please call the care guide team at [phone] if you need to cancel or reschedule your appointment.   If you are experiencing a Mental Health or Behavioral Health Crisis or need someone to talk to, please call the Suicide and Crisis Lifeline: 988 call the [place] National Suicide Prevention Lifeline: (503) 756-5486 or TTY: 4402249987 TTY 865-757-0014) to talk to a trained  counselor call 1-800-273-TALK (toll free, 24 hour hotline) go to [facility] [address] [place] [phone]) call 911   Patient verbalizes understanding of instructions and care plan provided today and agrees to view in MyChart. Active MyChart status and patient understanding of how to access instructions and care plan via MyChart confirmed with patient.     The patient has been provided with contact information for the care management team and has been advised to call with any health related questions or concerns.   Kymberlyn Eckford L. Noelle Penner, RN, BSN, [facility]  [facility] Care Management Coordinator  [phone]  Fax: [phone]

## 2023-02-01 NOTE — Patient Outreach (Addendum)
Care Coordination   Follow Up Visit Note   02/01/2023 Name: [name] [name] MRN: [id number] DOB: [birth year]  [name] [name] is a 57 y.o. year old female who sees Pickard, Priscille Heidelberg, MD for primary care. I spoke with  [name] [name] by phone today.  What matters to the patients health and wellness today?  Home health PT, cast ion stump   No worsening symptoms   Goals Addressed             This Visit's Progress    Home management of home health services, Congestive heart failure/infected stump ulcer- care coordination services       Interventions Today    Flowsheet Row Most Recent Value  Chronic Disease   Chronic disease during today's visit Congestive Heart Failure (CHF), Other  [home health PT services, confirmed all transportation needs met. Offer to assist with [facility] DSS transportation not needed at this time- confirmed no worsening symptoms to include CHF swelling, Shortness of breath, Confirms she has a cast]  General Interventions   General Interventions Discussed/Reviewed General Interventions Reviewed, [facility], Doctor Visits, Communication with  Doctor Visits Discussed/Reviewed Doctor Visits Reviewed, PCP, Specialist  [continue with wound care visits]  PCP/Specialist Visits Compliance with follow-up visit  Communication with PCP/Specialists  [home health PT services via [facility]]  Exercise Interventions   Exercise Discussed/Reviewed Exercise Reviewed, Physical Activity  [decreased activity]  Physical Activity Discussed/Reviewed Physical Activity Reviewed  [minimal activity]  Mental Health Interventions   Mental Health Discussed/Reviewed Mental Health Reviewed, Coping Strategies              SDOH assessments and interventions completed:  Yes  SDOH Interventions Today    Flowsheet Row Most Recent Value  SDOH Interventions   Transportation Interventions Intervention Not Indicated        Care Coordination Interventions:  Yes, provided    Follow up plan: Follow up call scheduled for 03/04/23    Encounter Outcome:  Patient Visit Completed   [name], RN, BSN, [facility]  [facility] Care Management Coordinator  [phone]  Fax: [phone]

## 2023-02-03 DIAGNOSIS — Z79891 Long term (current) use of opiate analgesic: Secondary | ICD-10-CM | POA: Diagnosis not present

## 2023-02-03 DIAGNOSIS — S91301A Unspecified open wound, right foot, initial encounter: Secondary | ICD-10-CM | POA: Diagnosis not present

## 2023-02-03 DIAGNOSIS — M25559 Pain in unspecified hip: Secondary | ICD-10-CM | POA: Diagnosis not present

## 2023-02-03 DIAGNOSIS — M25519 Pain in unspecified shoulder: Secondary | ICD-10-CM | POA: Diagnosis not present

## 2023-02-03 DIAGNOSIS — K5903 Drug induced constipation: Secondary | ICD-10-CM | POA: Diagnosis not present

## 2023-02-03 DIAGNOSIS — G8929 Other chronic pain: Secondary | ICD-10-CM | POA: Diagnosis not present

## 2023-02-04 ENCOUNTER — Encounter: Payer: 59 | Admitting: Physician Assistant

## 2023-02-04 DIAGNOSIS — N183 Chronic kidney disease, stage 3 unspecified: Secondary | ICD-10-CM | POA: Diagnosis not present

## 2023-02-04 DIAGNOSIS — I5042 Chronic combined systolic (congestive) and diastolic (congestive) heart failure: Secondary | ICD-10-CM | POA: Diagnosis not present

## 2023-02-04 DIAGNOSIS — G603 Idiopathic progressive neuropathy: Secondary | ICD-10-CM | POA: Diagnosis not present

## 2023-02-04 DIAGNOSIS — I130 Hypertensive heart and chronic kidney disease with heart failure and stage 1 through stage 4 chronic kidney disease, or unspecified chronic kidney disease: Secondary | ICD-10-CM | POA: Diagnosis not present

## 2023-02-04 DIAGNOSIS — R7303 Prediabetes: Secondary | ICD-10-CM | POA: Diagnosis not present

## 2023-02-04 DIAGNOSIS — L97512 Non-pressure chronic ulcer of other part of right foot with fat layer exposed: Secondary | ICD-10-CM | POA: Diagnosis not present

## 2023-02-04 NOTE — Progress Notes (Signed)
[name], [name] ([id number]) [id number] Page 1 of 8 Visit Report for 02/04/2023 Chief Complaint Document Details Patient Name: Date of Service: [name] [name], [name] [name] 02/04/2023 12:00 PM Medical Record Number: [id number] Patient Account Number: [id number] Date of Birth/Sex: Treating RN: 04-22-1965 (57 y.o. [name] Primary Care Provider: Lynnea Ferrier Other Clinician: Betha Loa Referring Provider: Treating Provider/Extender: Trula Ore in Treatment: 3 Information Obtained from: Patient Chief Complaint Right plantar foot ulcer Electronic Signature(s) Signed: 02/04/2023 12:14:29 PM By: Allen Derry PA-C Entered By: Allen Derry on 02/04/2023 09:14:28 -------------------------------------------------------------------------------- Debridement Details Patient Name: Date of Service: [name] Cloud D. 02/04/2023 12:00 PM Medical Record Number: [id number] Patient Account Number: [id number] Date of Birth/Sex: Treating RN: 05/18/1965 (57 y.o. [name] Primary Care Provider: Lynnea Ferrier Other Clinician: Betha Loa Referring Provider: Treating Provider/Extender: Trula Ore in Treatment: 3 Debridement Performed for Assessment: Wound #1 Right,Plantar,Posterior Foot Performed By: Physician Allen Derry, PA-C Debridement Type: Debridement Level of Consciousness (Pre-procedure): Awake and Alert Pre-procedure Verification/Time Out Yes - 12:30 Taken: Start Time: 12:30 Percent of Wound Bed Debrided: 100% T Area Debrided (cm): otal 0.49 Tissue and other material debrided: Viable, Non-Viable, Callus, Slough, Subcutaneous, Slough Level: Skin/Subcutaneous Tissue Debridement Description: Excisional Instrument: Curette Bleeding: Minimum Hemostasis Achieved: Pressure Response to Treatment: Procedure was tolerated well Level of Consciousness (Post- Awake and Alert procedure): [name], [name]  ([id number]) [id number] Page 2 of 8 Post Debridement Measurements of Total Wound Length: (cm) 0.7 Width: (cm) 0.9 Depth: (cm) 0.1 Volume: (cm) 0.049 Character of Wound/Ulcer Post Debridement: Stable Post Procedure Diagnosis Same as Pre-procedure Electronic Signature(s) Signed: 02/04/2023 12:47:37 PM By: Allen Derry PA-C Signed: 02/04/2023 1:20:52 PM By: Betha Loa Signed: 02/07/2023 5:06:23 PM By: Midge Aver MSN RN CNS WTA Entered By: Betha Loa on 02/04/2023 09:30:37 -------------------------------------------------------------------------------- HPI Details Patient Name: Date of Service: [name] Cloud D. 02/04/2023 12:00 PM Medical Record Number: [id number] Patient Account Number: [id number] Date of Birth/Sex: Treating RN: 03/07/1966 (57 y.o. [name] Primary Care Provider: Lynnea Ferrier Other Clinician: Betha Loa Referring Provider: Treating Provider/Extender: Wilma Flavin Weeks in Treatment: 3 History of Present Illness Chronic/Inactive Conditions Condition 1: 01-13-2023 patient's ABI on the left was 1.14 and on the right was 1.01 on screening today here in the clinic HPI Description: 01-13-2023 upon evaluation patient presents for initial inspection here in our clinic concerning an issue that actually occurring over the distal/plantar aspect of her right foot. 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 She tells me this started about a month ago encounter gradually appeared there was no inciting event. With that being said the patient tells me that she has been using Silvadene as prescribed by Dr. Logan Bores. This has not really helped much with getting the wound to heal. She has previously had total contact casting when she was in [place] and that something that she states she did very well with. Actually think that is probably good to be the way to go to be  perfectly honest. Patient does have a history of hypertension, congestive heart failure, chronic kidney disease stage III, and again prediabetes although she is not on any oral medication this is just being monitored at this point. 01-20-2023 upon evaluation today patient appears to be doing well currently in regard to her wound. Her foot ulcer actually showing signs of improvement which I feel like is excellent news. I do not see any signs of worsening overall and I do  Bactrim WOUND #1: - Foot Wound Laterality: Plantar, Right, Posterior Cleanser: Byram Ancillary Kit - 15 Day Supply (Generic) 3 x Per Week/30 Days Discharge Instructions: Use supplies as instructed; Kit contains: (15) Saline Bullets; (15) 3x3 Gauze; 15 pr Gloves Cleanser: Soap and Water 3 x Per Week/30 Days Discharge Instructions: Gently cleanse wound with antibacterial soap, rinse and pat dry prior to dressing wounds Prim Dressing: Hydrofera Blue Ready Transfer Foam, 2.5x2.5 (in/in) (Dispense As  Written) 3 x Per Week/30 Days ary Discharge Instructions: Apply Hydrofera Blue Ready to wound bed as directed Secondary Dressing: ABD Pad 5x9 (in/in) (Generic) 3 x Per Week/30 Days Discharge Instructions: Cover with ABD pad Secured With: Medipore T - 24M Medipore H Soft Cloth Surgical T ape ape, 2x2 (in/yd) (Generic) 3 x Per Week/30 Days Secured With: [employer] Sterile or Non-Sterile 6-ply 4.5x4 (yd/yd) (Generic) 3 x Per Week/30 Days Discharge Instructions: Apply Kerlix as directed 1. Will go ahead and recommend that we reapply the total contact cast and she is in agreement with that plan. With that being said I am going to recommend as well that we should make sure to secure the [facility] little bit better which I think will do better. 2. I am also can recommend patient should continue with an ABD pad over top of the Hydrofera Blue to secure and then we will see where things stand. We will see patient back for reevaluation in 1 week here in the clinic. If anything worsens or changes patient will contact our office for additional recommendations. Electronic Signature(s) Signed: 02/04/2023 12:43:14 PM By: Allen Derry PA-C Entered By: Allen Derry on 02/04/2023 09:43:14 -------------------------------------------------------------------------------- Total Contact Cast Details Patient Name: Date of Service: ITALI, RASCHE 02/04/2023 12:00 PM Medical Record Number: [id number] Patient Account Number: [id number] Date of Birth/Sex: Treating RN: [birth year] (57 y.o. [name] Primary Care Provider: Lynnea Ferrier Other Clinician: Betha Loa Referring Provider: Treating Provider/Extender: Trula Ore in Treatment: 3 T Contact Cast Applied for Wound Assessment: otal Wound #1 Right,Plantar,Posterior Foot Performed By: Physician Allen Derry, PA-C Post Procedure Diagnosis Same as Pre-procedure Electronic Signature(s) Signed: 02/04/2023 12:47:37 PM By:  Allen Derry PA-C Signed: 02/04/2023 1:20:52 PM By: Betha Loa Entered By: Betha Loa on 02/04/2023 09:32:11 [name] Cloud D ([id number]) [id number] Page 8 of 8 -------------------------------------------------------------------------------- SuperBill Details Patient Name: Date of Service: SHUNTEL, JENDRO 02/04/2023 Medical Record Number: [id number] Patient Account Number: [id number] Date of Birth/Sex: Treating RN: June 07, 1965 (57 y.o. 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 I10 Essential (primary) hypertension I50.42 Chronic combined systolic (congestive) and diastolic (congestive) heart failure N18.30 Chronic kidney disease, stage 3 unspecified R73.03 Prediabetes Facility Procedures : CPT4 Code: 02725366 Description: 11042 - DEB SUBQ TISSUE 20 SQ CM/< ICD-10 Diagnosis Description L97.512 Non-pressure chronic ulcer of other part of right foot with fat layer exposed Modifier: Quantity: 1 Physician Procedures : CPT4 Code Description Modifier [id number] 11042 - WC PHYS SUBQ TISS 20 SQ CM ICD-10 Diagnosis Description L97.512 Non-pressure chronic ulcer of other part of right foot with fat layer exposed Quantity: 1 Electronic Signature(s) Signed: 02/04/2023 12:43:53 PM By: Allen Derry PA-C Entered By: Allen Derry on 02/04/2023 09:43:53  [name], [name] ([id number]) [id number] Page 1 of 8 Visit Report for 02/04/2023 Chief Complaint Document Details Patient Name: Date of Service: [name] [name], [name] [name] 02/04/2023 12:00 PM Medical Record Number: [id number] Patient Account Number: [id number] Date of Birth/Sex: Treating RN: 04-22-1965 (57 y.o. [name] Primary Care Provider: Lynnea Ferrier Other Clinician: Betha Loa Referring Provider: Treating Provider/Extender: Trula Ore in Treatment: 3 Information Obtained from: Patient Chief Complaint Right plantar foot ulcer Electronic Signature(s) Signed: 02/04/2023 12:14:29 PM By: Allen Derry PA-C Entered By: Allen Derry on 02/04/2023 09:14:28 -------------------------------------------------------------------------------- Debridement Details Patient Name: Date of Service: [name] Cloud D. 02/04/2023 12:00 PM Medical Record Number: [id number] Patient Account Number: [id number] Date of Birth/Sex: Treating RN: 05/18/1965 (57 y.o. [name] Primary Care Provider: Lynnea Ferrier Other Clinician: Betha Loa Referring Provider: Treating Provider/Extender: Trula Ore in Treatment: 3 Debridement Performed for Assessment: Wound #1 Right,Plantar,Posterior Foot Performed By: Physician Allen Derry, PA-C Debridement Type: Debridement Level of Consciousness (Pre-procedure): Awake and Alert Pre-procedure Verification/Time Out Yes - 12:30 Taken: Start Time: 12:30 Percent of Wound Bed Debrided: 100% T Area Debrided (cm): otal 0.49 Tissue and other material debrided: Viable, Non-Viable, Callus, Slough, Subcutaneous, Slough Level: Skin/Subcutaneous Tissue Debridement Description: Excisional Instrument: Curette Bleeding: Minimum Hemostasis Achieved: Pressure Response to Treatment: Procedure was tolerated well Level of Consciousness (Post- Awake and Alert procedure): [name], [name]  ([id number]) [id number] Page 2 of 8 Post Debridement Measurements of Total Wound Length: (cm) 0.7 Width: (cm) 0.9 Depth: (cm) 0.1 Volume: (cm) 0.049 Character of Wound/Ulcer Post Debridement: Stable Post Procedure Diagnosis Same as Pre-procedure Electronic Signature(s) Signed: 02/04/2023 12:47:37 PM By: Allen Derry PA-C Signed: 02/04/2023 1:20:52 PM By: Betha Loa Signed: 02/07/2023 5:06:23 PM By: Midge Aver MSN RN CNS WTA Entered By: Betha Loa on 02/04/2023 09:30:37 -------------------------------------------------------------------------------- HPI Details Patient Name: Date of Service: [name] Cloud D. 02/04/2023 12:00 PM Medical Record Number: [id number] Patient Account Number: [id number] Date of Birth/Sex: Treating RN: 03/07/1966 (57 y.o. [name] Primary Care Provider: Lynnea Ferrier Other Clinician: Betha Loa Referring Provider: Treating Provider/Extender: Wilma Flavin Weeks in Treatment: 3 History of Present Illness Chronic/Inactive Conditions Condition 1: 01-13-2023 patient's ABI on the left was 1.14 and on the right was 1.01 on screening today here in the clinic HPI Description: 01-13-2023 upon evaluation patient presents for initial inspection here in our clinic concerning an issue that actually occurring over the distal/plantar aspect of her right foot. 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 She tells me this started about a month ago encounter gradually appeared there was no inciting event. With that being said the patient tells me that she has been using Silvadene as prescribed by Dr. Logan Bores. This has not really helped much with getting the wound to heal. She has previously had total contact casting when she was in [place] and that something that she states she did very well with. Actually think that is probably good to be the way to go to be  perfectly honest. Patient does have a history of hypertension, congestive heart failure, chronic kidney disease stage III, and again prediabetes although she is not on any oral medication this is just being monitored at this point. 01-20-2023 upon evaluation today patient appears to be doing well currently in regard to her wound. Her foot ulcer actually showing signs of improvement which I feel like is excellent news. I do not see any signs of worsening overall and I do  dressing wounds Prim Dressing: Hydrofera Blue Ready Transfer Foam, 2.5x2.5 (in/in) (Dispense As Written) 3 x Per Week/30 Days ary Discharge Instructions: Apply Hydrofera Blue Ready to wound bed as directed Secondary Dressing: ABD Pad 5x9 (in/in) (Generic) 3 x Per Week/30 Days Discharge Instructions: Cover with ABD pad Secured With: Medipore T - 18M Medipore H Soft Cloth Surgical T ape ape, 2x2 (in/yd) (Generic) 3 x Per Week/30 Days Secured With: [employer] Sterile or Non-Sterile 6-ply 4.5x4 (yd/yd) (Generic) 3 x Per Week/30 Days Discharge Instructions: Apply Kerlix as directed Electronic Signature(s) Signed: 02/04/2023 12:47:37 PM By: Allen Derry PA-C Signed: 02/04/2023 1:20:52 PM By: Betha Loa Entered By: Betha Loa on 02/04/2023 09:32:23 -------------------------------------------------------------------------------- Problem List Details Patient Name: Date of Service: [name] Cloud D. 02/04/2023 12:00 PM Medical Record Number: [id number] Patient Account Number: [id number] Date of  Birth/Sex: Treating RN: November 24, 1965 (57 y.o. 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 01/13/2023 No Yes L97.512 Non-pressure chronic ulcer of other part of right foot with fat layer exposed 01/13/2023 No Yes LYNAE, FORAN ([id number]) [id number] Page 5 of 8 I10 Essential (primary) hypertension 01/13/2023 No Yes I50.42 Chronic combined systolic (congestive) and diastolic (congestive) heart failure 01/13/2023 No Yes N18.30 Chronic kidney disease, stage 3 unspecified 01/13/2023 No Yes R73.03 Prediabetes 01/13/2023 No Yes Inactive Problems Resolved Problems Electronic Signature(s) Signed: 02/04/2023 12:14:25 PM By: Allen Derry PA-C Entered By: Allen Derry on 02/04/2023 09:14:25 -------------------------------------------------------------------------------- Progress Note Details Patient Name: Date of Service: [name] Cloud D. 02/04/2023 12:00 PM Medical Record Number: [id number] Patient Account Number: [id number] Date of Birth/Sex: Treating RN: [birth year] (57 y.o. [name] Primary Care Provider: Lynnea Ferrier Other Clinician: Betha Loa Referring Provider: Treating Provider/Extender: Trula Ore in Treatment: 3 Subjective Chief Complaint Information obtained from Patient Right plantar foot ulcer History of Present Illness (HPI) Chronic/Inactive Condition: 01-13-2023 patient's ABI on the left was 1.14 and on the right was 1.01 on screening today here in the clinic 01-13-2023 upon evaluation patient presents for initial inspection here in our clinic concerning an issue that actually occurring over the distal/plantar aspect of her right foot. She is neuropathic but  not strictly diabetic she does follow the category of prediabetes apparently. She tells me this started about a month ago encounter gradually appeared there was no inciting event. With that being said the patient tells me that she has been using Silvadene as prescribed by Dr. Logan Bores. This has not really helped much with getting the wound to heal. She has previously had total contact casting when she was in [place] and that something that she states she did very well with. Actually think that is probably good to be the way to go to be perfectly honest. Patient does have a history of hypertension, congestive heart failure, chronic kidney disease stage III, and again prediabetes although she is not on any oral medication this is just being monitored at this point. 01-20-2023 upon evaluation today patient appears to be doing well currently in regard to her wound. Her foot ulcer actually showing signs of improvement which I feel like is excellent news. I do not see any signs of worsening overall and I do believe that the patient is making excellent headway towards complete closure which is wonderful news. 01-25-2023 upon evaluation today patient appears to be doing well currently in regard  Bactrim WOUND #1: - Foot Wound Laterality: Plantar, Right, Posterior Cleanser: Byram Ancillary Kit - 15 Day Supply (Generic) 3 x Per Week/30 Days Discharge Instructions: Use supplies as instructed; Kit contains: (15) Saline Bullets; (15) 3x3 Gauze; 15 pr Gloves Cleanser: Soap and Water 3 x Per Week/30 Days Discharge Instructions: Gently cleanse wound with antibacterial soap, rinse and pat dry prior to dressing wounds Prim Dressing: Hydrofera Blue Ready Transfer Foam, 2.5x2.5 (in/in) (Dispense As  Written) 3 x Per Week/30 Days ary Discharge Instructions: Apply Hydrofera Blue Ready to wound bed as directed Secondary Dressing: ABD Pad 5x9 (in/in) (Generic) 3 x Per Week/30 Days Discharge Instructions: Cover with ABD pad Secured With: Medipore T - 24M Medipore H Soft Cloth Surgical T ape ape, 2x2 (in/yd) (Generic) 3 x Per Week/30 Days Secured With: [employer] Sterile or Non-Sterile 6-ply 4.5x4 (yd/yd) (Generic) 3 x Per Week/30 Days Discharge Instructions: Apply Kerlix as directed 1. Will go ahead and recommend that we reapply the total contact cast and she is in agreement with that plan. With that being said I am going to recommend as well that we should make sure to secure the [facility] little bit better which I think will do better. 2. I am also can recommend patient should continue with an ABD pad over top of the Hydrofera Blue to secure and then we will see where things stand. We will see patient back for reevaluation in 1 week here in the clinic. If anything worsens or changes patient will contact our office for additional recommendations. Electronic Signature(s) Signed: 02/04/2023 12:43:14 PM By: Allen Derry PA-C Entered By: Allen Derry on 02/04/2023 09:43:14 -------------------------------------------------------------------------------- Total Contact Cast Details Patient Name: Date of Service: ITALI, RASCHE 02/04/2023 12:00 PM Medical Record Number: [id number] Patient Account Number: [id number] Date of Birth/Sex: Treating RN: [birth year] (57 y.o. [name] Primary Care Provider: Lynnea Ferrier Other Clinician: Betha Loa Referring Provider: Treating Provider/Extender: Trula Ore in Treatment: 3 T Contact Cast Applied for Wound Assessment: otal Wound #1 Right,Plantar,Posterior Foot Performed By: Physician Allen Derry, PA-C Post Procedure Diagnosis Same as Pre-procedure Electronic Signature(s) Signed: 02/04/2023 12:47:37 PM By:  Allen Derry PA-C Signed: 02/04/2023 1:20:52 PM By: Betha Loa Entered By: Betha Loa on 02/04/2023 09:32:11 [name] Cloud D ([id number]) [id number] Page 8 of 8 -------------------------------------------------------------------------------- SuperBill Details Patient Name: Date of Service: SHUNTEL, JENDRO 02/04/2023 Medical Record Number: [id number] Patient Account Number: [id number] Date of Birth/Sex: Treating RN: June 07, 1965 (57 y.o. 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 I10 Essential (primary) hypertension I50.42 Chronic combined systolic (congestive) and diastolic (congestive) heart failure N18.30 Chronic kidney disease, stage 3 unspecified R73.03 Prediabetes Facility Procedures : CPT4 Code: [id number] Description: 11042 - DEB SUBQ TISSUE 20 SQ CM/< ICD-10 Diagnosis Description L97.512 Non-pressure chronic ulcer of other part of right foot with fat layer exposed Modifier: Quantity: 1 Physician Procedures : CPT4 Code Description Modifier [id number] 11042 - WC PHYS SUBQ TISS 20 SQ CM ICD-10 Diagnosis Description L97.512 Non-pressure chronic ulcer of other part of right foot with fat layer exposed Quantity: 1 Electronic Signature(s) Signed: 02/04/2023 12:43:53 PM By: Allen Derry PA-C Entered By: Allen Derry on 02/04/2023 09:43:53

## 2023-02-07 NOTE — Progress Notes (Signed)
in the wound bed Date Initiated: 01/13/2023 Target Resolution Date: 02/12/2023 Goal Status: Active Patient/caregiver will verbalize understanding of reason and process for debridement of necrotic tissue Date Initiated: 01/13/2023 Target Resolution Date: 02/12/2023 Goal Status: Active Interventions: Assess patient pain level pre-, during and post procedure and prior to discharge Provide education on necrotic tissue and debridement process Treatment Activities: Apply topical anesthetic as ordered : 01/13/2023 Excisional debridement : 01/13/2023 Notes: Wound/Skin Impairment Nursing Diagnoses: Impaired tissue integrity Knowledge deficit related to ulceration/compromised skin integrity Goals: Patient/caregiver will verbalize understanding of skin care regimen Date Initiated: 01/13/2023 Target  Resolution Date: 02/12/2023 Goal Status: Active Ulcer/skin breakdown will have a volume reduction of 30% by week 4 Date Initiated: 01/13/2023 Target Resolution Date: 02/12/2023 Goal Status: Active Ulcer/skin breakdown will have a volume reduction of 50% by week 8 Date Initiated: 01/13/2023 Target Resolution Date: 03/15/2023 Goal Status: Active Ulcer/skin breakdown will have a volume reduction of 80% by week 12 Date Initiated: 01/13/2023 Target Resolution Date: 04/14/2023 Goal Status: Active Interventions: Assess patient/caregiver ability to obtain necessary supplies Assess patient/caregiver ability to perform ulcer/skin care regimen upon admission and as needed Assess ulceration(s) every visit Provide education on ulcer and skin care Treatment Activities: Referred to DME Shelbylynn Walczyk for dressing supplies : 01/13/2023 Notes: Electronic Signature(s) Signed: 02/04/2023 1:20:52 PM By: Betha Loa Signed: 02/07/2023 5:06:23 PM By: Midge Aver MSN RN CNS WTA Entered By: Betha Loa on 02/04/2023 09:33:05 -------------------------------------------------------------------------------- Pain Assessment Details Patient Name: Date of Service: [name] Cloud D. 02/04/2023 12:00 PM Medical Record Number: [id number] Patient Account Number: [id number] Date of Birth/Sex: Treating RN: Mar 30, 1966 (57 y.o. [name] [name] Primary Care Tanaysia Bhardwaj: Lynnea Ferrier Other Clinician: Dominiqua, Hills D ([id number]) [phone].pdf Page 6 of 8 Referring Jessicamarie Amiri: Treating Zayneb Baucum/Extender: Trula Ore in Treatment: 3 Active Problems Location of Pain Severity and Description of Pain Patient Has Paino No Site Locations Pain Management and Medication Current Pain Management: Electronic Signature(s) Signed: 02/04/2023 1:20:52 PM By: Betha Loa Signed: 02/07/2023 5:06:23 PM By: Midge Aver MSN RN CNS WTA Entered By: Betha Loa on  02/04/2023 09:10:29 -------------------------------------------------------------------------------- Patient/Caregiver Education Details Patient Name: Date of Service: [name] [name] 10/18/2024andnbsp12:00 PM Medical Record Number: [id number] Patient Account Number: [id number] Date of Birth/Gender: Treating RN: January 21, 1966 (57 y.o. [name] [name] Primary Care Physician: Lynnea Ferrier Other Clinician: Betha Loa Referring Physician: Treating Physician/Extender: Trula Ore in Treatment: 3 Education Assessment Education Provided To: Patient Education Topics Provided Wound/Skin Impairment: Handouts: Other: continue wound care as directed Methods: Explain/Verbal Responses: State content correctly Electronic Signature(s) Signed: 02/04/2023 1:20:52 PM By: Gerre Pebbles D ([id number]) [id number] Page 7 of 8 Entered By: Betha Loa on 02/04/2023 09:57:13 -------------------------------------------------------------------------------- Wound Assessment Details Patient Name: Date of Service: [name] [name] 02/04/2023 12:00 PM Medical Record Number: [id number] Patient Account Number: [id number] Date of Birth/Sex: Treating RN: 1965/12/10 (57 y.o. 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, Plantar, Posterior Foot Wound Open Wounding Event: Gradually Appeared Status: Date Acquired: 12/06/2022 Comorbid Asthma, Sleep Apnea, Congestive Heart Failure, Hypertension, Weeks Of Treatment: 3 History: Seizure Disorder Clustered Wound: No Photos Wound Measurements Length: (cm) 0.7 Width: (cm) 0.9 Depth: (cm) 0.1 Area: (cm) 0.495 Volume: (cm) 0.049 % Reduction in Area:  -28.6% % Reduction in Volume: 36.4% Wound Description Classification: Full Thickness Without Exposed Suppor Exudate Amount:  Medium Exudate Type: Serosanguineous Exudate Color: red, brown t Structures Foul Odor After Cleansing: No Slough/Fibrino No Wound Bed Granulation Amount: Medium (34-66%) Exposed Structure Granulation Quality: Red, Pink Fascia Exposed: No Fat Layer (Subcutaneous Tissue) Exposed: Yes Tendon Exposed: No Muscle Exposed: No Joint Exposed: No Bone Exposed: No Treatment Notes Wound #1 (Foot) Wound Laterality: Plantar, Right, Posterior Cleanser NISHI, BASSETTE D ([id number]) [id number] Page 8 of 8 [employer] Ancillary Kit - 15 Day Supply Discharge Instruction: Use supplies as instructed; Kit contains: (15) Saline Bullets; (15) 3x3 Gauze; 15 pr Gloves Soap and Water Discharge Instruction: Gently cleanse wound with antibacterial soap, rinse and pat dry prior to dressing wounds Peri-Wound Care Topical Primary Dressing Hydrofera Blue Ready Transfer Foam, 2.5x2.5 (in/in) Discharge Instruction: Apply Hydrofera Blue Ready to wound bed as directed Secondary Dressing ABD Pad 5x9 (in/in) Discharge Instruction: Cover with ABD pad Secured With Medipore T - 33M Medipore H Soft Cloth Surgical T ape ape, 2x2 (in/yd) Kerlix Roll Sterile or Non-Sterile 6-ply 4.5x4 (yd/yd) Discharge Instruction: Apply Kerlix as directed Compression Wrap Compression Stockings Add-Ons Electronic Signature(s) Signed: 02/04/2023 1:20:52 PM By: Betha Loa Signed: 02/07/2023 5:06:23 PM By: Midge Aver MSN RN CNS WTA Entered By: Betha Loa on 02/04/2023 09:19:26 -------------------------------------------------------------------------------- Vitals Details Patient Name: Date of Service: [name] Cloud D. 02/04/2023 12:00 PM Medical Record Number: [id number] Patient Account Number: [id number] Date of Birth/Sex: Treating RN: 01-04-1966 (57 y.o. [name] [name] Primary Care Gustaf Mccarter: Lynnea Ferrier Other Clinician: Betha Loa Referring Davone Shinault: Treating Mcdaniel Ohms/Extender: Trula Ore in Treatment: 3 Vital Signs Time Taken: 12:08 Temperature (F): 97.8 Height (in): 64 Pulse (bpm): 76 Weight (lbs): 235 Respiratory Rate (breaths/min): 18 Body Mass Index (BMI): 40.3 Blood Pressure (mmHg): 124/75 Reference Range: 80 - 120 mg / dl Electronic Signature(s) Signed: 02/04/2023 1:20:52 PM By: Betha Loa Entered By: Betha Loa on 02/04/2023 09:10:25  Medium Exudate Type: Serosanguineous Exudate Color: red, brown t Structures Foul Odor After Cleansing: No Slough/Fibrino No Wound Bed Granulation Amount: Medium (34-66%) Exposed Structure Granulation Quality: Red, Pink Fascia Exposed: No Fat Layer (Subcutaneous Tissue) Exposed: Yes Tendon Exposed: No Muscle Exposed: No Joint Exposed: No Bone Exposed: No Treatment Notes Wound #1 (Foot) Wound Laterality: Plantar, Right, Posterior Cleanser NISHI, BASSETTE D ([id number]) [id number] Page 8 of 8 Byram Ancillary Kit - 15 Day Supply Discharge Instruction: Use supplies as instructed; Kit contains: (15) Saline Bullets; (15) 3x3 Gauze; 15 pr Gloves Soap and Water Discharge Instruction: Gently cleanse wound with antibacterial soap, rinse and pat dry prior to dressing wounds Peri-Wound Care Topical Primary Dressing Hydrofera Blue Ready Transfer Foam, 2.5x2.5 (in/in) Discharge Instruction: Apply Hydrofera Blue Ready to wound bed as directed Secondary Dressing ABD Pad 5x9 (in/in) Discharge Instruction: Cover with ABD pad Secured With Medipore T - 33M Medipore H Soft Cloth Surgical T ape ape, 2x2 (in/yd) Kerlix Roll Sterile or Non-Sterile 6-ply 4.5x4 (yd/yd) Discharge Instruction: Apply Kerlix as directed Compression Wrap Compression Stockings Add-Ons Electronic Signature(s) Signed: 02/04/2023 1:20:52 PM By: Betha Loa Signed: 02/07/2023 5:06:23 PM By: Midge Aver MSN RN CNS WTA Entered By: Betha Loa on 02/04/2023 09:19:26 -------------------------------------------------------------------------------- Vitals Details Patient Name: Date of Service: [name] Cloud D. 02/04/2023 12:00 PM Medical Record Number: [id number] Patient Account Number: [id number] Date of Birth/Sex: Treating RN: 01-04-1966 (57 y.o. [name] [name] Primary Care Gustaf Mccarter: Lynnea Ferrier Other Clinician: Betha Loa Referring Davone Shinault: Treating Mcdaniel Ohms/Extender: Trula Ore in Treatment: 3 Vital Signs Time Taken: 12:08 Temperature (F): 97.8 Height (in): 64 Pulse (bpm): 76 Weight (lbs): 235 Respiratory Rate (breaths/min): 18 Body Mass Index (BMI): 40.3 Blood Pressure (mmHg): 124/75 Reference Range: 80 - 120 mg / dl Electronic Signature(s) Signed: 02/04/2023 1:20:52 PM By: Betha Loa Entered By: Betha Loa on 02/04/2023 09:10:25  in the wound bed Date Initiated: 01/13/2023 Target Resolution Date: 02/12/2023 Goal Status: Active Patient/caregiver will verbalize understanding of reason and process for debridement of necrotic tissue Date Initiated: 01/13/2023 Target Resolution Date: 02/12/2023 Goal Status: Active Interventions: Assess patient pain level pre-, during and post procedure and prior to discharge Provide education on necrotic tissue and debridement process Treatment Activities: Apply topical anesthetic as ordered : 01/13/2023 Excisional debridement : 01/13/2023 Notes: Wound/Skin Impairment Nursing Diagnoses: Impaired tissue integrity Knowledge deficit related to ulceration/compromised skin integrity Goals: Patient/caregiver will verbalize understanding of skin care regimen Date Initiated: 01/13/2023 Target  Resolution Date: 02/12/2023 Goal Status: Active Ulcer/skin breakdown will have a volume reduction of 30% by week 4 Date Initiated: 01/13/2023 Target Resolution Date: 02/12/2023 Goal Status: Active Ulcer/skin breakdown will have a volume reduction of 50% by week 8 Date Initiated: 01/13/2023 Target Resolution Date: 03/15/2023 Goal Status: Active Ulcer/skin breakdown will have a volume reduction of 80% by week 12 Date Initiated: 01/13/2023 Target Resolution Date: 04/14/2023 Goal Status: Active Interventions: Assess patient/caregiver ability to obtain necessary supplies Assess patient/caregiver ability to perform ulcer/skin care regimen upon admission and as needed Assess ulceration(s) every visit Provide education on ulcer and skin care Treatment Activities: Referred to DME Shelbylynn Walczyk for dressing supplies : 01/13/2023 Notes: Electronic Signature(s) Signed: 02/04/2023 1:20:52 PM By: Betha Loa Signed: 02/07/2023 5:06:23 PM By: Midge Aver MSN RN CNS WTA Entered By: Betha Loa on 02/04/2023 09:33:05 -------------------------------------------------------------------------------- Pain Assessment Details Patient Name: Date of Service: [name] Cloud D. 02/04/2023 12:00 PM Medical Record Number: [id number] Patient Account Number: [id number] Date of Birth/Sex: Treating RN: Mar 30, 1966 (57 y.o. [name] [name] Primary Care Tanaysia Bhardwaj: Lynnea Ferrier Other Clinician: Dominiqua, Hills D ([id number]) [phone].pdf Page 6 of 8 Referring Jessicamarie Amiri: Treating Zayneb Baucum/Extender: Trula Ore in Treatment: 3 Active Problems Location of Pain Severity and Description of Pain Patient Has Paino No Site Locations Pain Management and Medication Current Pain Management: Electronic Signature(s) Signed: 02/04/2023 1:20:52 PM By: Betha Loa Signed: 02/07/2023 5:06:23 PM By: Midge Aver MSN RN CNS WTA Entered By: Betha Loa on  02/04/2023 09:10:29 -------------------------------------------------------------------------------- Patient/Caregiver Education Details Patient Name: Date of Service: [name] [name] 10/18/2024andnbsp12:00 PM Medical Record Number: [id number] Patient Account Number: [id number] Date of Birth/Gender: Treating RN: January 21, 1966 (57 y.o. [name] [name] Primary Care Physician: Lynnea Ferrier Other Clinician: Betha Loa Referring Physician: Treating Physician/Extender: Trula Ore in Treatment: 3 Education Assessment Education Provided To: Patient Education Topics Provided Wound/Skin Impairment: Handouts: Other: continue wound care as directed Methods: Explain/Verbal Responses: State content correctly Electronic Signature(s) Signed: 02/04/2023 1:20:52 PM By: Gerre Pebbles D ([id number]) [id number] Page 7 of 8 Entered By: Betha Loa on 02/04/2023 09:57:13 -------------------------------------------------------------------------------- Wound Assessment Details Patient Name: Date of Service: [name] [name] 02/04/2023 12:00 PM Medical Record Number: [id number] Patient Account Number: [id number] Date of Birth/Sex: Treating RN: 1965/12/10 (57 y.o. 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

## 2023-02-11 ENCOUNTER — Ambulatory Visit: Payer: 59 | Admitting: Family Medicine

## 2023-02-11 NOTE — Progress Notes (Signed)
3 x Per Week/30 Days Secured With: [employer] Sterile or Non-Sterile 6-ply 4.5x4 (yd/yd) (DME) (Generic) 3 x Per Week/30 Days Discharge Instructions: Apply Kerlix as directed Electronic Signature(s) Signed: 01/14/2023 12:30:42 PM By: Midge Aver MSN RN CNS WTA Signed: 01/14/2023 2:21:22 PM By: Allen Derry PA-C Entered By: Midge Aver on 01/13/2023 12:45:07 -------------------------------------------------------------------------------- Problem List Details Patient Name: Date of Service: [name] Cloud [name]. 01/13/2023 2:00 PM Medical Record Number: [id number] Patient Account Number: [id number] Date of Birth/Sex: Treating RN: [birth year] (57 y.o. 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 01/13/2023 No Yes L97.512 Non-pressure chronic ulcer of other part of right foot with fat layer exposed 01/13/2023 No Yes [name] [name], [name] [name] ([id number]) [id number] Page 5 of 10 I10 Essential (primary) hypertension 01/13/2023 No Yes I50.42 Chronic combined systolic (congestive) and diastolic (congestive) heart failure 01/13/2023 No Yes N18.30 Chronic kidney disease, stage 3 unspecified 01/13/2023 No Yes R73.03 Prediabetes 01/13/2023 No Yes Inactive Problems Resolved Problems Electronic Signature(s) Signed: 01/13/2023 2:46:04 PM By: Allen Derry PA-C Previous Signature: 01/13/2023 2:22:57 PM Version By: Allen Derry PA-C Entered By: Allen Derry on 01/13/2023 11:46:04 -------------------------------------------------------------------------------- Progress Note Details Patient Name: Date of Service: [name] Cloud [name]. 01/13/2023 2:00 PM Medical Record Number: [id number] Patient Account Number: [id number] Date of Birth/Sex: Treating RN: 1965-09-06 (57 y.o. [name] Primary Care Provider: Lynnea Ferrier Other Clinician: Referring Provider: Treating Provider/Extender: Trula Ore in Treatment: 0 Subjective Chief Complaint Information obtained from Patient Right plantar foot ulcer History of Present Illness (HPI) Chronic/Inactive Condition: 01-13-2023 patient's ABI on the left was 1.14 and on the right was 1.01 on screening today here in the clinic 01-13-2023 upon evaluation patient presents for initial inspection here in our clinic concerning an issue that actually occurring over the distal/plantar aspect of her right foot. 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 She tells me this started about a month ago encounter gradually appeared there was no inciting event. With that being said the patient tells me that she has been using Silvadene as prescribed by Dr. Logan Bores. This has  not really helped much with getting the wound to heal. She has previously had total contact casting when she was in [place] and that something that she states she did very well with. Actually think that is probably good to be the way to go to be perfectly honest. Patient does have a history of hypertension, congestive heart failure, chronic kidney disease stage III, and again prediabetes although she is not on any oral medication this is just being monitored at this point. Patient History Information obtained from Patient. Allergies tetracycline, tramadol, oxybutynin, phenazopyridine, ciprofloxacin, codeine, estradiol Social History Never smoker, Marital Status - Widowed, Alcohol Use - Never, Drug Use - Prior History, Caffeine Use - Moderate. [name] [name], [name] [name] ([id number]) [id number] Page 6 of 10 Medical History Respiratory Patient has history of Asthma, Sleep Apnea Cardiovascular Patient has history of Congestive Heart Failure, Hypertension Neurologic Patient has history of Seizure Disorder Medical A Surgical History Notes nd Genitourinary CKD stage 3 Psychiatric Bipolar type 1 Review of Systems (ROS) Constitutional Symptoms (General Health) Denies complaints or symptoms of Fatigue, Fever, Chills, Marked Weight Change. Ear/Nose/Mouth/Throat Denies complaints or symptoms of Difficult clearing ears, Sinusitis. Hematologic/Lymphatic Denies complaints or symptoms of Bleeding / Clotting Disorders, Human Immunodeficiency Virus.  [name] [name], [name] [name] ([id number]) [id number] Page 1 of 10 Visit Report for 01/13/2023 Chief Complaint Document Details Patient Name: Date of Service: ASSA, KURCZEWSKI 01/13/2023 2:00 PM Medical Record Number: [id number] Patient Account Number: [id number] Date of Birth/Sex: Treating RN: 09-Nov-1965 (57 y.o. [name] Primary Care Provider: Lynnea Ferrier Other Clinician: Referring Provider: Treating Provider/Extender: Trula Ore in Treatment: 0 Information Obtained from: Patient Chief Complaint Right plantar foot ulcer Electronic Signature(s) Signed: 01/13/2023 2:46:55 PM By: Allen Derry PA-C Entered By: Allen Derry on 01/13/2023 11:46:54 -------------------------------------------------------------------------------- Debridement Details Patient Name: Date of Service: [name] Cloud [name]. 01/13/2023 2:00 PM Medical Record Number: [id number] Patient Account Number: [id number] Date of Birth/Sex: Treating RN: 01-Dec-1965 (57 y.o. [name] Primary Care Provider: Lynnea Ferrier Other Clinician: Referring Provider: Treating Provider/Extender: Wilma Flavin Weeks in Treatment: 0 Debridement Performed for Assessment: Wound #1 Right,Plantar,Posterior Foot Performed By: Physician Allen Derry, PA-C Debridement Type: Debridement Level of Consciousness (Pre-procedure): Awake and Alert Pre-procedure Verification/Time Out Yes - 14:55 Taken: Start Time: 14:55 Pain Control: Lidocaine 4% T opical Solution Percent of Wound Bed Debrided: 100% T Area Debrided (cm): otal 0.38 Tissue and other material debrided: Viable, Non-Viable, Callus, Slough, Subcutaneous, Slough Level: Skin/Subcutaneous Tissue Debridement Description: Excisional Instrument: Curette Bleeding: Minimum Hemostasis Achieved: Pressure Procedural Pain: 0 Post Procedural Pain: 0 Response to Treatment: Procedure was tolerated well [name] [name], [name] [name] ([id number])  [id number] Page 2 of 10 Level of Consciousness (Post- Awake and Alert procedure): Post Debridement Measurements of Total Wound Length: (cm) 0.7 Width: (cm) 0.7 Depth: (cm) 0.2 Volume: (cm) 0.077 Character of Wound/Ulcer Post Debridement: Stable Post Procedure Diagnosis Same as Pre-procedure Electronic Signature(s) Signed: 01/14/2023 12:30:42 PM By: Midge Aver MSN RN CNS WTA Signed: 01/14/2023 2:21:22 PM By: Allen Derry PA-C Entered By: Midge Aver on 01/13/2023 11:58:25 -------------------------------------------------------------------------------- HPI Details Patient Name: Date of Service: [name] Cloud [name]. 01/13/2023 2:00 PM Medical Record Number: [id number] Patient Account Number: [id number] Date of Birth/Sex: Treating RN: Aug 12, 1965 (57 y.o. [name] Primary Care Provider: Lynnea Ferrier Other Clinician: Referring Provider: Treating Provider/Extender: Wilma Flavin Weeks in Treatment: 0 History of Present Illness Chronic/Inactive Conditions Condition 1: 01-13-2023 patient's ABI on the left was 1.14 and on the right was 1.01 on screening today here in the clinic HPI Description: 01-13-2023 upon evaluation patient presents for initial inspection here in our clinic concerning an issue that actually occurring over the distal/plantar aspect of her right foot. 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 She tells me this started about a month ago encounter gradually appeared there was no inciting event. With that being said the patient tells me that she has been using Silvadene as prescribed by Dr. Logan Bores. This has not really helped much with getting the wound to heal. She has previously had total contact casting when she was in [place] and that something that she states she did very well with. Actually think that is probably good to be the way to go to be perfectly  honest. Patient does have a history of hypertension, congestive heart failure, chronic kidney disease stage III, and again prediabetes although she is not on any oral medication this is just being monitored at this point. Electronic Signature(s) Signed: 01/14/2023 7:55:16 AM By: Allen Derry PA-C Entered By: Allen Derry on 01/14/2023 04:55:15 Physical Exam Details -------------------------------------------------------------------------------- [name] [name] ([id number]) [id number] Page 3 of 10 Patient Name: Date of Service: [name] [name], [name] [name] 01/13/2023 2:00 PM Medical  Record Number: [id number] Patient Account Number: [id number] Date of Birth/Sex: Treating RN: 02-29-1966 (57 y.o. [name] Primary Care Provider: Lynnea Ferrier Other Clinician: Referring Provider: Treating Provider/Extender: Wilma Flavin Weeks in Treatment: 0 Constitutional patient is hypertensive.. pulse regular and within target range for patient.Marland Kitchen respirations regular, non-labored and within target range for patient.Marland Kitchen temperature within target range for patient.. Well-nourished and well-hydrated in no acute distress. Eyes conjunctiva clear no eyelid edema noted. pupils equal round and reactive to light and accommodation. Ears, Nose, Mouth, and Throat no gross abnormality of ear auricles or external auditory canals. normal hearing noted during conversation. mucus membranes moist. Respiratory normal breathing without difficulty. Cardiovascular 2+ dorsalis pedis/posterior tibialis pulses. no clubbing, cyanosis, significant edema, <3 sec cap refill. Musculoskeletal normal gait and posture. no significant deformity or arthritic changes, no loss or range of motion, no clubbing. Psychiatric this patient is able to make decisions and demonstrates good insight into disease process. Alert and Oriented x 3. pleasant and cooperative. Notes Upon inspection patient's wound bed  actually showed signs of pretty good granulation and epithelization although she did have quite a bit of callus as well I did have to perform debridement clearway necrotic debris she tolerated that today without complication and postdebridement the wound bed actually does appear to be doing better which is good news. With that being said I still believe that she is going require some offloading more aggressively and I think the total contact casting is probably going to be the way to go in the end. Electronic Signature(s) Signed: 01/14/2023 7:59:01 AM By: Allen Derry PA-C Entered By: Allen Derry on 01/14/2023 04:59:01 -------------------------------------------------------------------------------- Physician Orders Details Patient Name: Date of Service: [name] Cloud [name]. 01/13/2023 2:00 PM Medical Record Number: [id number] Patient Account Number: [id number] Date of Birth/Sex: Treating RN: 07-02-1965 (57 y.o. 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 Bathing/ [employer] wounds with antibacterial soap and water. May shower; gently cleanse wound with antibacterial soap, rinse and pat dry prior to dressing wounds Anesthetic (Use 'Patient Medications' Section for Anesthetic Order Entry) Lidocaine applied to wound  bed Off-Loading Foot Defender - From Dr Logan Bores office Total Contact Cast to Right Lower Extremity - Will be initiated on 01/25/2023 Wound Treatment Wound #1 - Foot Wound Laterality: Plantar, Right, Posterior Cleanser: [employer] Ancillary Kit - 15 Day Supply (DME) (Generic) 3 x Per Week/30 Days Discharge Instructions: Use supplies as instructed; Kit contains: (15) Saline Bullets; (15) 3x3 Gauze; 15 pr Gloves Cleanser: Soap and Water 3 x Per Week/30 Days Discharge Instructions: Gently cleanse wound with antibacterial soap, rinse and pat dry prior to dressing wounds Prim Dressing: Hydrofera Blue Ready Transfer Foam, 2.5x2.5 (in/in) (DME) (Dispense As Written) 3 x Per Week/30 Days ary Discharge Instructions: Apply Hydrofera Blue Ready to wound bed as directed Secondary Dressing: ABD Pad 5x9 (in/in) (DME) (Generic) 3 x Per Week/30 Days Discharge Instructions: Cover with ABD pad Secured With: Med[employer] T - 62M Medipore H Soft Cloth Surgical T ape ape, 2x2 (in/yd) (DME) (Generic)  3 x Per Week/30 Days Secured With: [employer] Sterile or Non-Sterile 6-ply 4.5x4 (yd/yd) (DME) (Generic) 3 x Per Week/30 Days Discharge Instructions: Apply Kerlix as directed Electronic Signature(s) Signed: 01/14/2023 12:30:42 PM By: Midge Aver MSN RN CNS WTA Signed: 01/14/2023 2:21:22 PM By: Allen Derry PA-C Entered By: Midge Aver on 01/13/2023 12:45:07 -------------------------------------------------------------------------------- Problem List Details Patient Name: Date of Service: [name] Cloud [name]. 01/13/2023 2:00 PM Medical Record Number: [id number] Patient Account Number: [id number] Date of Birth/Sex: Treating RN: [birth year] (57 y.o. 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 01/13/2023 No Yes L97.512 Non-pressure chronic ulcer of other part of right foot with fat layer exposed 01/13/2023 No Yes [name] [name], [name] [name] ([id number]) [id number] Page 5 of 10 I10 Essential (primary) hypertension 01/13/2023 No Yes I50.42 Chronic combined systolic (congestive) and diastolic (congestive) heart failure 01/13/2023 No Yes N18.30 Chronic kidney disease, stage 3 unspecified 01/13/2023 No Yes R73.03 Prediabetes 01/13/2023 No Yes Inactive Problems Resolved Problems Electronic Signature(s) Signed: 01/13/2023 2:46:04 PM By: Allen Derry PA-C Previous Signature: 01/13/2023 2:22:57 PM Version By: Allen Derry PA-C Entered By: Allen Derry on 01/13/2023 11:46:04 -------------------------------------------------------------------------------- Progress Note Details Patient Name: Date of Service: [name] Cloud [name]. 01/13/2023 2:00 PM Medical Record Number: [id number] Patient Account Number: [id number] Date of Birth/Sex: Treating RN: 1965-09-06 (57 y.o. [name] Primary Care Provider: Lynnea Ferrier Other Clinician: Referring Provider: Treating Provider/Extender: Trula Ore in Treatment: 0 Subjective Chief Complaint Information obtained from Patient Right plantar foot ulcer History of Present Illness (HPI) Chronic/Inactive Condition: 01-13-2023 patient's ABI on the left was 1.14 and on the right was 1.01 on screening today here in the clinic 01-13-2023 upon evaluation patient presents for initial inspection here in our clinic concerning an issue that actually occurring over the distal/plantar aspect of her right foot. 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 She tells me this started about a month ago encounter gradually appeared there was no inciting event. With that being said the patient tells me that she has been using Silvadene as prescribed by Dr. Logan Bores. This has  not really helped much with getting the wound to heal. She has previously had total contact casting when she was in [place] and that something that she states she did very well with. Actually think that is probably good to be the way to go to be perfectly honest. Patient does have a history of hypertension, congestive heart failure, chronic kidney disease stage III, and again prediabetes although she is not on any oral medication this is just being monitored at this point. Patient History Information obtained from Patient. Allergies tetracycline, tramadol, oxybutynin, phenazopyridine, ciprofloxacin, codeine, estradiol Social History Never smoker, Marital Status - Widowed, Alcohol Use - Never, Drug Use - Prior History, Caffeine Use - Moderate. [name] [name], [name] [name] ([id number]) [id number] Page 6 of 10 Medical History Respiratory Patient has history of Asthma, Sleep Apnea Cardiovascular Patient has history of Congestive Heart Failure, Hypertension Neurologic Patient has history of Seizure Disorder Medical A Surgical History Notes nd Genitourinary CKD stage 3 Psychiatric Bipolar type 1 Review of Systems (ROS) Constitutional Symptoms (General Health) Denies complaints or symptoms of Fatigue, Fever, Chills, Marked Weight Change. Ear/Nose/Mouth/Throat Denies complaints or symptoms of Difficult clearing ears, Sinusitis. Hematologic/Lymphatic Denies complaints or symptoms of Bleeding / Clotting Disorders, Human Immunodeficiency Virus.  [name] [name], [name] [name] ([id number]) [id number] Page 1 of 10 Visit Report for 01/13/2023 Chief Complaint Document Details Patient Name: Date of Service: ASSA, KURCZEWSKI 01/13/2023 2:00 PM Medical Record Number: [id number] Patient Account Number: [id number] Date of Birth/Sex: Treating RN: 09-Nov-1965 (57 y.o. [name] Primary Care Provider: Lynnea Ferrier Other Clinician: Referring Provider: Treating Provider/Extender: Trula Ore in Treatment: 0 Information Obtained from: Patient Chief Complaint Right plantar foot ulcer Electronic Signature(s) Signed: 01/13/2023 2:46:55 PM By: Allen Derry PA-C Entered By: Allen Derry on 01/13/2023 11:46:54 -------------------------------------------------------------------------------- Debridement Details Patient Name: Date of Service: [name] Cloud [name]. 01/13/2023 2:00 PM Medical Record Number: [id number] Patient Account Number: [id number] Date of Birth/Sex: Treating RN: 01-Dec-1965 (57 y.o. [name] Primary Care Provider: Lynnea Ferrier Other Clinician: Referring Provider: Treating Provider/Extender: Wilma Flavin Weeks in Treatment: 0 Debridement Performed for Assessment: Wound #1 Right,Plantar,Posterior Foot Performed By: Physician Allen Derry, PA-C Debridement Type: Debridement Level of Consciousness (Pre-procedure): Awake and Alert Pre-procedure Verification/Time Out Yes - 14:55 Taken: Start Time: 14:55 Pain Control: Lidocaine 4% T opical Solution Percent of Wound Bed Debrided: 100% T Area Debrided (cm): otal 0.38 Tissue and other material debrided: Viable, Non-Viable, Callus, Slough, Subcutaneous, Slough Level: Skin/Subcutaneous Tissue Debridement Description: Excisional Instrument: Curette Bleeding: Minimum Hemostasis Achieved: Pressure Procedural Pain: 0 Post Procedural Pain: 0 Response to Treatment: Procedure was tolerated well [name] [name], [name] [name] ([id number])  [id number] Page 2 of 10 Level of Consciousness (Post- Awake and Alert procedure): Post Debridement Measurements of Total Wound Length: (cm) 0.7 Width: (cm) 0.7 Depth: (cm) 0.2 Volume: (cm) 0.077 Character of Wound/Ulcer Post Debridement: Stable Post Procedure Diagnosis Same as Pre-procedure Electronic Signature(s) Signed: 01/14/2023 12:30:42 PM By: Midge Aver MSN RN CNS WTA Signed: 01/14/2023 2:21:22 PM By: Allen Derry PA-C Entered By: Midge Aver on 01/13/2023 11:58:25 -------------------------------------------------------------------------------- HPI Details Patient Name: Date of Service: [name] Cloud [name]. 01/13/2023 2:00 PM Medical Record Number: [id number] Patient Account Number: [id number] Date of Birth/Sex: Treating RN: Aug 12, 1965 (57 y.o. [name] Primary Care Provider: Lynnea Ferrier Other Clinician: Referring Provider: Treating Provider/Extender: Wilma Flavin Weeks in Treatment: 0 History of Present Illness Chronic/Inactive Conditions Condition 1: 01-13-2023 patient's ABI on the left was 1.14 and on the right was 1.01 on screening today here in the clinic HPI Description: 01-13-2023 upon evaluation patient presents for initial inspection here in our clinic concerning an issue that actually occurring over the distal/plantar aspect of her right foot. 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 She tells me this started about a month ago encounter gradually appeared there was no inciting event. With that being said the patient tells me that she has been using Silvadene as prescribed by Dr. Logan Bores. This has not really helped much with getting the wound to heal. She has previously had total contact casting when she was in [place] and that something that she states she did very well with. Actually think that is probably good to be the way to go to be perfectly  honest. Patient does have a history of hypertension, congestive heart failure, chronic kidney disease stage III, and again prediabetes although she is not on any oral medication this is just being monitored at this point. Electronic Signature(s) Signed: 01/14/2023 7:55:16 AM By: Allen Derry PA-C Entered By: Allen Derry on 01/14/2023 04:55:15 Physical Exam Details -------------------------------------------------------------------------------- [name] [name] ([id number]) [id number] Page 3 of 10 Patient Name: Date of Service: [name] [name], [name] [name] 01/13/2023 2:00 PM Medical  3 x Per Week/30 Days Secured With: [employer] Sterile or Non-Sterile 6-ply 4.5x4 (yd/yd) (DME) (Generic) 3 x Per Week/30 Days Discharge Instructions: Apply Kerlix as directed Electronic Signature(s) Signed: 01/14/2023 12:30:42 PM By: Midge Aver MSN RN CNS WTA Signed: 01/14/2023 2:21:22 PM By: Allen Derry PA-C Entered By: Midge Aver on 01/13/2023 12:45:07 -------------------------------------------------------------------------------- Problem List Details Patient Name: Date of Service: [name] Cloud [name]. 01/13/2023 2:00 PM Medical Record Number: [id number] Patient Account Number: [id number] Date of Birth/Sex: Treating RN: [birth year] (57 y.o. 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 01/13/2023 No Yes L97.512 Non-pressure chronic ulcer of other part of right foot with fat layer exposed 01/13/2023 No Yes [name] [name], [name] [name] ([id number]) [id number] Page 5 of 10 I10 Essential (primary) hypertension 01/13/2023 No Yes I50.42 Chronic combined systolic (congestive) and diastolic (congestive) heart failure 01/13/2023 No Yes N18.30 Chronic kidney disease, stage 3 unspecified 01/13/2023 No Yes R73.03 Prediabetes 01/13/2023 No Yes Inactive Problems Resolved Problems Electronic Signature(s) Signed: 01/13/2023 2:46:04 PM By: Allen Derry PA-C Previous Signature: 01/13/2023 2:22:57 PM Version By: Allen Derry PA-C Entered By: Allen Derry on 01/13/2023 11:46:04 -------------------------------------------------------------------------------- Progress Note Details Patient Name: Date of Service: [name] Cloud [name]. 01/13/2023 2:00 PM Medical Record Number: [id number] Patient Account Number: [id number] Date of Birth/Sex: Treating RN: 1965-09-06 (57 y.o. [name] Primary Care Provider: Lynnea Ferrier Other Clinician: Referring Provider: Treating Provider/Extender: Trula Ore in Treatment: 0 Subjective Chief Complaint Information obtained from Patient Right plantar foot ulcer History of Present Illness (HPI) Chronic/Inactive Condition: 01-13-2023 patient's ABI on the left was 1.14 and on the right was 1.01 on screening today here in the clinic 01-13-2023 upon evaluation patient presents for initial inspection here in our clinic concerning an issue that actually occurring over the distal/plantar aspect of her right foot. 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 She tells me this started about a month ago encounter gradually appeared there was no inciting event. With that being said the patient tells me that she has been using Silvadene as prescribed by Dr. Logan Bores. This has  not really helped much with getting the wound to heal. She has previously had total contact casting when she was in [place] and that something that she states she did very well with. Actually think that is probably good to be the way to go to be perfectly honest. Patient does have a history of hypertension, congestive heart failure, chronic kidney disease stage III, and again prediabetes although she is not on any oral medication this is just being monitored at this point. Patient History Information obtained from Patient. Allergies tetracycline, tramadol, oxybutynin, phenazopyridine, ciprofloxacin, codeine, estradiol Social History Never smoker, Marital Status - Widowed, Alcohol Use - Never, Drug Use - Prior History, Caffeine Use - Moderate. [name] [name], [name] [name] ([id number]) [id number] Page 6 of 10 Medical History Respiratory Patient has history of Asthma, Sleep Apnea Cardiovascular Patient has history of Congestive Heart Failure, Hypertension Neurologic Patient has history of Seizure Disorder Medical A Surgical History Notes nd Genitourinary CKD stage 3 Psychiatric Bipolar type 1 Review of Systems (ROS) Constitutional Symptoms (General Health) Denies complaints or symptoms of Fatigue, Fever, Chills, Marked Weight Change. Ear/Nose/Mouth/Throat Denies complaints or symptoms of Difficult clearing ears, Sinusitis. Hematologic/Lymphatic Denies complaints or symptoms of Bleeding / Clotting Disorders, Human Immunodeficiency Virus.

## 2023-02-13 DIAGNOSIS — M25561 Pain in right knee: Secondary | ICD-10-CM | POA: Diagnosis not present

## 2023-02-13 DIAGNOSIS — M25552 Pain in left hip: Secondary | ICD-10-CM | POA: Diagnosis not present

## 2023-02-13 DIAGNOSIS — M961 Postlaminectomy syndrome, not elsewhere classified: Secondary | ICD-10-CM | POA: Diagnosis not present

## 2023-02-13 DIAGNOSIS — M25511 Pain in right shoulder: Secondary | ICD-10-CM | POA: Diagnosis not present

## 2023-02-14 ENCOUNTER — Ambulatory Visit: Payer: 59 | Admitting: Physician Assistant

## 2023-02-15 ENCOUNTER — Other Ambulatory Visit: Payer: Self-pay | Admitting: Physician Assistant

## 2023-02-15 ENCOUNTER — Encounter: Payer: 59 | Admitting: Physician Assistant

## 2023-02-15 DIAGNOSIS — N183 Chronic kidney disease, stage 3 unspecified: Secondary | ICD-10-CM | POA: Diagnosis not present

## 2023-02-15 DIAGNOSIS — I5042 Chronic combined systolic (congestive) and diastolic (congestive) heart failure: Secondary | ICD-10-CM | POA: Diagnosis not present

## 2023-02-15 DIAGNOSIS — G6289 Other specified polyneuropathies: Secondary | ICD-10-CM | POA: Diagnosis not present

## 2023-02-15 DIAGNOSIS — G603 Idiopathic progressive neuropathy: Secondary | ICD-10-CM | POA: Diagnosis not present

## 2023-02-15 DIAGNOSIS — R7303 Prediabetes: Secondary | ICD-10-CM | POA: Diagnosis not present

## 2023-02-15 DIAGNOSIS — I130 Hypertensive heart and chronic kidney disease with heart failure and stage 1 through stage 4 chronic kidney disease, or unspecified chronic kidney disease: Secondary | ICD-10-CM | POA: Diagnosis not present

## 2023-02-15 DIAGNOSIS — L97512 Non-pressure chronic ulcer of other part of right foot with fat layer exposed: Secondary | ICD-10-CM

## 2023-02-15 DIAGNOSIS — S91209D Unspecified open wound of unspecified toe(s) with damage to nail, subsequent encounter: Secondary | ICD-10-CM | POA: Diagnosis not present

## 2023-02-15 NOTE — Progress Notes (Addendum)
to dressing wounds Prim Dressing: Hydrofera Blue Ready Transfer Foam, 2.5x2.5 (in/in) (DME) (Dispense As Written) 3 x Per Week/30 Days ary Discharge Instructions: Apply Hydrofera Blue Ready to wound bed as directed Secondary Dressing: ABD Pad 5x9 (in/in) (DME) (Generic) 3 x Per Week/30 Days Discharge Instructions: Cover with ABD pad Secured With: Medipore T - 5M Medipore H Soft Cloth Surgical T ape ape, 2x2 (in/yd) (DME) (Generic) 3 x Per Week/30 Days Secured With: Kerlix Roll Sterile or Non-Sterile 6-ply 4.5x4 (yd/yd) (DME) (Generic) 3 x Per Week/30 Days Discharge Instructions: Apply Kerlix as directed Laboratory Bacteria identified in Wound by Culture (MICRO) LOINC Code: [id number] Convenience Name: Wound culture routine Radiology MRI with and without Contrast Patient Medications llergies: tetracycline, penicillin, tramadol, oxybutynin, phenazopyridine, ciprofloxacin, codeine, estradiol A Notifications Medication Indication Start End 02/15/2023 clindamycin HCl DOSE 1 - oral 300 mg capsule - 1 capsule oral every six hours x 30 days [name] [name], [name] [name] ([id number]) [id number] Page 4 of 7 Electronic Signature(s) Signed: 02/15/2023 6:10:41 PM By: Allen Derry PA-C Signed: 02/16/2023 5:05:03 PM By: Midge Aver MSN RN CNS WTA Previous Signature: 02/15/2023 9:59:08 AM Version By: Allen Derry PA-C Entered By: Midge Aver on 02/15/2023 10:06:09 -------------------------------------------------------------------------------- Problem List Details Patient Name: Date of Service: [name] [name]. 02/15/2023 9:15 A M Medical Record Number: [id number] Patient Account Number:  [id number] Date of Birth/Sex: Treating RN: 02-06-1966 (57 y.o. 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 01/13/2023 No Yes L97.512 Non-pressure chronic ulcer of other part of right foot with fat layer exposed 01/13/2023 No Yes I10 Essential (primary) hypertension 01/13/2023 No Yes I50.42 Chronic combined systolic (congestive) and diastolic (congestive) heart failure 01/13/2023 No Yes N18.30 Chronic kidney disease, stage 3 unspecified 01/13/2023 No Yes R73.03 Prediabetes 01/13/2023 No Yes Inactive Problems Resolved Problems Electronic Signature(s) Signed: 02/15/2023 9:21:35 AM By: Allen Derry PA-C Entered By: Allen Derry on 02/15/2023 09:21:35 [name] [name] ([id number]) [id number] Page 5 of 7 -------------------------------------------------------------------------------- Progress Note Details Patient Name: Date of Service: [name] [name], [name] [name] 02/15/2023 9:15 A M Medical Record Number: [id number] Patient Account Number: [id number] Date of Birth/Sex: Treating RN: 11-25-1965 (57 y.o. [name] [name] Primary Care Provider: Lynnea [name] Other Clinician: Referring Provider: Treating Provider/Extender: Trula Ore in Treatment: 4 Subjective Chief Complaint Information obtained from Patient Right plantar foot ulcer History of Present Illness (HPI) Chronic/Inactive Condition: 01-13-2023 patient's ABI on the left was 1.14 and on the right was 1.01 on screening today here in the clinic 01-13-2023 upon evaluation patient presents for initial inspection here in our clinic concerning an issue that actually occurring over the distal/plantar aspect of her right foot. She is neuropathic but not strictly  diabetic she does follow the category of prediabetes apparently. She tells me this started about a month ago encounter gradually appeared there was no inciting event. With that being said the patient tells me that she has been using Silvadene as prescribed by Dr. Logan Bores. This has not really helped much with getting the wound to heal. She has previously had total contact casting when she was in [place] and that something that she states she did very well with. Actually think that is probably good to be the way to go to be perfectly honest. Patient does have a history of hypertension, congestive heart failure, chronic kidney disease stage III, and again prediabetes although she  to dressing wounds Prim Dressing: Hydrofera Blue Ready Transfer Foam, 2.5x2.5 (in/in) (DME) (Dispense As Written) 3 x Per Week/30 Days ary Discharge Instructions: Apply Hydrofera Blue Ready to wound bed as directed Secondary Dressing: ABD Pad 5x9 (in/in) (DME) (Generic) 3 x Per Week/30 Days Discharge Instructions: Cover with ABD pad Secured With: Medipore T - 5M Medipore H Soft Cloth Surgical T ape ape, 2x2 (in/yd) (DME) (Generic) 3 x Per Week/30 Days Secured With: Kerlix Roll Sterile or Non-Sterile 6-ply 4.5x4 (yd/yd) (DME) (Generic) 3 x Per Week/30 Days Discharge Instructions: Apply Kerlix as directed Laboratory Bacteria identified in Wound by Culture (MICRO) LOINC Code: [id number] Convenience Name: Wound culture routine Radiology MRI with and without Contrast Patient Medications llergies: tetracycline, penicillin, tramadol, oxybutynin, phenazopyridine, ciprofloxacin, codeine, estradiol A Notifications Medication Indication Start End 02/15/2023 clindamycin HCl DOSE 1 - oral 300 mg capsule - 1 capsule oral every six hours x 30 days [name] [name], [name] [name] ([id number]) [id number] Page 4 of 7 Electronic Signature(s) Signed: 02/15/2023 6:10:41 PM By: Allen Derry PA-C Signed: 02/16/2023 5:05:03 PM By: Midge Aver MSN RN CNS WTA Previous Signature: 02/15/2023 9:59:08 AM Version By: Allen Derry PA-C Entered By: Midge Aver on 02/15/2023 10:06:09 -------------------------------------------------------------------------------- Problem List Details Patient Name: Date of Service: [name] [name]. 02/15/2023 9:15 A M Medical Record Number: [id number] Patient Account Number:  [id number] Date of Birth/Sex: Treating RN: 02-06-1966 (57 y.o. 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 01/13/2023 No Yes L97.512 Non-pressure chronic ulcer of other part of right foot with fat layer exposed 01/13/2023 No Yes I10 Essential (primary) hypertension 01/13/2023 No Yes I50.42 Chronic combined systolic (congestive) and diastolic (congestive) heart failure 01/13/2023 No Yes N18.30 Chronic kidney disease, stage 3 unspecified 01/13/2023 No Yes R73.03 Prediabetes 01/13/2023 No Yes Inactive Problems Resolved Problems Electronic Signature(s) Signed: 02/15/2023 9:21:35 AM By: Allen Derry PA-C Entered By: Allen Derry on 02/15/2023 09:21:35 [name] [name] ([id number]) [id number] Page 5 of 7 -------------------------------------------------------------------------------- Progress Note Details Patient Name: Date of Service: [name] [name], [name] [name] 02/15/2023 9:15 A M Medical Record Number: [id number] Patient Account Number: [id number] Date of Birth/Sex: Treating RN: 11-25-1965 (57 y.o. [name] [name] Primary Care Provider: Lynnea [name] Other Clinician: Referring Provider: Treating Provider/Extender: Trula Ore in Treatment: 4 Subjective Chief Complaint Information obtained from Patient Right plantar foot ulcer History of Present Illness (HPI) Chronic/Inactive Condition: 01-13-2023 patient's ABI on the left was 1.14 and on the right was 1.01 on screening today here in the clinic 01-13-2023 upon evaluation patient presents for initial inspection here in our clinic concerning an issue that actually occurring over the distal/plantar aspect of her right foot. She is neuropathic but not strictly  diabetic she does follow the category of prediabetes apparently. She tells me this started about a month ago encounter gradually appeared there was no inciting event. With that being said the patient tells me that she has been using Silvadene as prescribed by Dr. Logan Bores. This has not really helped much with getting the wound to heal. She has previously had total contact casting when she was in [place] and that something that she states she did very well with. Actually think that is probably good to be the way to go to be perfectly honest. Patient does have a history of hypertension, congestive heart failure, chronic kidney disease stage III, and again prediabetes although she  wound measures 0.7cm length x 0.8cm width x 1.9cm depth; 0.44cm^2 area and 0.836cm^3 volume. There is Fat Layer (Subcutaneous Tissue) exposed. There is no tunneling or undermining noted. There is a medium amount of serosanguineous drainage noted. There is medium (34-66%) red, pink granulation within the wound bed. 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 Bathing/ Shower/ Hygiene: Wash wounds with antibacterial soap and water. May shower; gently cleanse wound with antibacterial soap, rinse and pat dry prior to dressing wounds Anesthetic (Use 'Patient Medications' Section for Anesthetic Order Entry): Lidocaine applied to wound bed Off-Loading: T Contact Cast to Right Lower Extremity - Initiated on 01/25/2023 HOLD for MRI otal Medications-Please add to medication list.: P.O. Antibiotics - continue Clindamycin The following medication(s) was prescribed: clindamycin HCl oral 300 mg capsule 1 1 capsule oral every six hours x 30 days starting 02/15/2023 WOUND #1: - Foot Wound Laterality: Plantar, Right, Posterior Cleanser: Byram Ancillary Kit - 15 Day Supply (Generic) 3 x Per Week/30 Days Discharge Instructions: Use  supplies as instructed; Kit contains: (15) Saline Bullets; (15) 3x3 Gauze; 15 pr Gloves Cleanser: Soap and Water 3 x Per Week/30 Days Discharge Instructions: Gently cleanse wound with antibacterial soap, rinse and pat dry prior to dressing wounds Prim Dressing: Hydrofera Blue Ready Transfer Foam, 2.5x2.5 (in/in) (DME) (Dispense As Written) 3 x Per Week/30 Days ary Discharge Instructions: Apply Hydrofera Blue Ready to wound bed as directed Secondary Dressing: ABD Pad 5x9 (in/in) (DME) (Generic) 3 x Per Week/30 Days Discharge Instructions: Cover with ABD pad Secured With: Medipore T - 80M Medipore H Soft Cloth Surgical T ape ape, 2x2 (in/yd) (DME) (Generic) 3 x Per Week/30 Days Secured With: [employer] Sterile or Non-Sterile 6-ply 4.5x4 (yd/yd) (DME) (Generic) 3 x Per Week/30 Days Discharge Instructions: Apply Kerlix as directed 1. Based on what I am seeing I am going to go ahead and recommend that we place the patient on clindamycin this is going to be 4 times per day and I discussed with her that this is something that may need to be changed depending on how things progress it also may be something that she can stop early if she does not end up having any significant osteomyelitis noted on MRI. 2. I am good recommend as well that the patient should continue with the [facility] followed by ABD pad to cover and roll gauze to secure in place. 3. I did hold the total contact cast for the time being until we ensure that we have what ever may be going on infection wise under control she is in agreement with that plan. We will see patient back for reevaluation in 1 week here in the clinic. If anything worsens or changes patient will contact our office for additional recommendations. Electronic Signature(s) Signed: 02/15/2023 10:00:15 AM By: Allen Derry PA-C Entered By: Allen Derry on 02/15/2023 10:00:15 [name] [name] ([id number]) [id number] Page 7 of  7 -------------------------------------------------------------------------------- SuperBill Details Patient Name: Date of Service: [name] [name], [name] [name] 02/15/2023 Medical Record Number: [id number] Patient Account Number: [id number] Date of Birth/Sex: Treating RN: 16-Oct-1965 (57 y.o. 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 There is Fat Layer (Subcutaneous Tissue) exposed. There is no tunneling or undermining noted. There is a medium amount of serosanguineous drainage noted. There is medium (34-66%) red, pink granulation within the wound bed. 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 Bathing/ Shower/ Hygiene: Wash wounds with antibacterial soap and water. May shower; gently cleanse wound with antibacterial soap, rinse and pat dry prior to dressing wounds Anesthetic (Use 'Patient Medications' Section for Anesthetic Order Entry): Lidocaine applied to wound bed Off-Loading: T Contact Cast to Right Lower Extremity - Initiated on 01/25/2023 HOLD for MRI otal Medications-Please add to medication list.: P.O. Antibiotics - continue Clindamycin The following medication(s) was prescribed: clindamycin HCl oral 300 mg capsule 1 1 capsule oral every six hours x 30 days starting 02/15/2023 WOUND #1: - Foot Wound Laterality: Plantar, Right, Posterior Cleanser: Byram Ancillary Kit - 15 Day Supply (Generic) 3 x Per Week/30 Days Discharge Instructions: Use  supplies as instructed; Kit contains: (15) Saline Bullets; (15) 3x3 Gauze; 15 pr Gloves Cleanser: Soap and Water 3 x Per Week/30 Days Discharge Instructions: Gently cleanse wound with antibacterial soap, rinse and pat dry prior to dressing wounds Prim Dressing: Hydrofera Blue Ready Transfer Foam, 2.5x2.5 (in/in) (DME) (Dispense As Written) 3 x Per Week/30 Days ary Discharge Instructions: Apply Hydrofera Blue Ready to wound bed as directed Secondary Dressing: ABD Pad 5x9 (in/in) (DME) (Generic) 3 x Per Week/30 Days Discharge Instructions: Cover with ABD pad Secured With: Medipore T - 80M Medipore H Soft Cloth Surgical T ape ape, 2x2 (in/yd) (DME) (Generic) 3 x Per Week/30 Days Secured With: [employer] Sterile or Non-Sterile 6-ply 4.5x4 (yd/yd) (DME) (Generic) 3 x Per Week/30 Days Discharge Instructions: Apply Kerlix as directed 1. Based on what I am seeing I am going to go ahead and recommend that we place the patient on clindamycin this is going to be 4 times per day and I discussed with her that this is something that may need to be changed depending on how things progress it also may be something that she can stop early if she does not end up having any significant osteomyelitis noted on MRI. 2. I am good recommend as well that the patient should continue with the [facility] followed by ABD pad to cover and roll gauze to secure in place. 3. I did hold the total contact cast for the time being until we ensure that we have what ever may be going on infection wise under control she is in agreement with that plan. We will see patient back for reevaluation in 1 week here in the clinic. If anything worsens or changes patient will contact our office for additional recommendations. Electronic Signature(s) Signed: 02/15/2023 10:00:15 AM By: Allen Derry PA-C Entered By: Allen Derry on 02/15/2023 10:00:15 [name] [name] ([id number]) [id number] Page 7 of  7 -------------------------------------------------------------------------------- SuperBill Details Patient Name: Date of Service: [name] [name], [name] [name] 02/15/2023 Medical Record Number: [id number] Patient Account Number: [id number] Date of Birth/Sex: Treating RN: 16-Oct-1965 (57 y.o. 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 L97.512 Non-pressure  to dressing wounds Prim Dressing: Hydrofera Blue Ready Transfer Foam, 2.5x2.5 (in/in) (DME) (Dispense As Written) 3 x Per Week/30 Days ary Discharge Instructions: Apply Hydrofera Blue Ready to wound bed as directed Secondary Dressing: ABD Pad 5x9 (in/in) (DME) (Generic) 3 x Per Week/30 Days Discharge Instructions: Cover with ABD pad Secured With: Medipore T - 5M Medipore H Soft Cloth Surgical T ape ape, 2x2 (in/yd) (DME) (Generic) 3 x Per Week/30 Days Secured With: Kerlix Roll Sterile or Non-Sterile 6-ply 4.5x4 (yd/yd) (DME) (Generic) 3 x Per Week/30 Days Discharge Instructions: Apply Kerlix as directed Laboratory Bacteria identified in Wound by Culture (MICRO) LOINC Code: [id number] Convenience Name: Wound culture routine Radiology MRI with and without Contrast Patient Medications llergies: tetracycline, penicillin, tramadol, oxybutynin, phenazopyridine, ciprofloxacin, codeine, estradiol A Notifications Medication Indication Start End 02/15/2023 clindamycin HCl DOSE 1 - oral 300 mg capsule - 1 capsule oral every six hours x 30 days [name] [name], [name] [name] ([id number]) [id number] Page 4 of 7 Electronic Signature(s) Signed: 02/15/2023 6:10:41 PM By: Allen Derry PA-C Signed: 02/16/2023 5:05:03 PM By: Midge Aver MSN RN CNS WTA Previous Signature: 02/15/2023 9:59:08 AM Version By: Allen Derry PA-C Entered By: Midge Aver on 02/15/2023 10:06:09 -------------------------------------------------------------------------------- Problem List Details Patient Name: Date of Service: [name] [name]. 02/15/2023 9:15 A M Medical Record Number: [id number] Patient Account Number:  [id number] Date of Birth/Sex: Treating RN: 02-06-1966 (57 y.o. 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 01/13/2023 No Yes L97.512 Non-pressure chronic ulcer of other part of right foot with fat layer exposed 01/13/2023 No Yes I10 Essential (primary) hypertension 01/13/2023 No Yes I50.42 Chronic combined systolic (congestive) and diastolic (congestive) heart failure 01/13/2023 No Yes N18.30 Chronic kidney disease, stage 3 unspecified 01/13/2023 No Yes R73.03 Prediabetes 01/13/2023 No Yes Inactive Problems Resolved Problems Electronic Signature(s) Signed: 02/15/2023 9:21:35 AM By: Allen Derry PA-C Entered By: Allen Derry on 02/15/2023 09:21:35 [name] [name] ([id number]) [id number] Page 5 of 7 -------------------------------------------------------------------------------- Progress Note Details Patient Name: Date of Service: [name] [name], [name] [name] 02/15/2023 9:15 A M Medical Record Number: [id number] Patient Account Number: [id number] Date of Birth/Sex: Treating RN: 11-25-1965 (57 y.o. [name] [name] Primary Care Provider: Lynnea [name] Other Clinician: Referring Provider: Treating Provider/Extender: Trula Ore in Treatment: 4 Subjective Chief Complaint Information obtained from Patient Right plantar foot ulcer History of Present Illness (HPI) Chronic/Inactive Condition: 01-13-2023 patient's ABI on the left was 1.14 and on the right was 1.01 on screening today here in the clinic 01-13-2023 upon evaluation patient presents for initial inspection here in our clinic concerning an issue that actually occurring over the distal/plantar aspect of her right foot. She is neuropathic but not strictly  diabetic she does follow the category of prediabetes apparently. She tells me this started about a month ago encounter gradually appeared there was no inciting event. With that being said the patient tells me that she has been using Silvadene as prescribed by Dr. Logan Bores. This has not really helped much with getting the wound to heal. She has previously had total contact casting when she was in [place] and that something that she states she did very well with. Actually think that is probably good to be the way to go to be perfectly honest. Patient does have a history of hypertension, congestive heart failure, chronic kidney disease stage III, and again prediabetes although she  wound measures 0.7cm length x 0.8cm width x 1.9cm depth; 0.44cm^2 area and 0.836cm^3 volume. There is Fat Layer (Subcutaneous Tissue) exposed. There is no tunneling or undermining noted. There is a medium amount of serosanguineous drainage noted. There is medium (34-66%) red, pink granulation within the wound bed. 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 Bathing/ Shower/ Hygiene: Wash wounds with antibacterial soap and water. May shower; gently cleanse wound with antibacterial soap, rinse and pat dry prior to dressing wounds Anesthetic (Use 'Patient Medications' Section for Anesthetic Order Entry): Lidocaine applied to wound bed Off-Loading: T Contact Cast to Right Lower Extremity - Initiated on 01/25/2023 HOLD for MRI otal Medications-Please add to medication list.: P.O. Antibiotics - continue Clindamycin The following medication(s) was prescribed: clindamycin HCl oral 300 mg capsule 1 1 capsule oral every six hours x 30 days starting 02/15/2023 WOUND #1: - Foot Wound Laterality: Plantar, Right, Posterior Cleanser: [employer] Ancillary Kit - 15 Day Supply (Generic) 3 x Per Week/30 Days Discharge Instructions: Use  supplies as instructed; Kit contains: (15) Saline Bullets; (15) 3x3 Gauze; 15 pr Gloves Cleanser: Soap and Water 3 x Per Week/30 Days Discharge Instructions: Gently cleanse wound with antibacterial soap, rinse and pat dry prior to dressing wounds Prim Dressing: Hydrofera Blue Ready Transfer Foam, 2.5x2.5 (in/in) (DME) (Dispense As Written) 3 x Per Week/30 Days ary Discharge Instructions: Apply Hydrofera Blue Ready to wound bed as directed Secondary Dressing: ABD Pad 5x9 (in/in) (DME) (Generic) 3 x Per Week/30 Days Discharge Instructions: Cover with ABD pad Secured With: Medipore T - 80M Medipore H Soft Cloth Surgical T ape ape, 2x2 (in/yd) (DME) (Generic) 3 x Per Week/30 Days Secured With: [employer] Sterile or Non-Sterile 6-ply 4.5x4 (yd/yd) (DME) (Generic) 3 x Per Week/30 Days Discharge Instructions: Apply Kerlix as directed 1. Based on what I am seeing I am going to go ahead and recommend that we place the patient on clindamycin this is going to be 4 times per day and I discussed with her that this is something that may need to be changed depending on how things progress it also may be something that she can stop early if she does not end up having any significant osteomyelitis noted on MRI. 2. I am good recommend as well that the patient should continue with the [facility] followed by ABD pad to cover and roll gauze to secure in place. 3. I did hold the total contact cast for the time being until we ensure that we have what ever may be going on infection wise under control she is in agreement with that plan. We will see patient back for reevaluation in 1 week here in the clinic. If anything worsens or changes patient will contact our office for additional recommendations. Electronic Signature(s) Signed: 02/15/2023 10:00:15 AM By: Allen Derry PA-C Entered By: Allen Derry on 02/15/2023 10:00:15 [name] [name] ([id number]) [id number] Page 7 of  7 -------------------------------------------------------------------------------- SuperBill Details Patient Name: Date of Service: [name] [name], [name] [name] 02/15/2023 Medical Record Number: [id number] Patient Account Number: [id number] Date of Birth/Sex: Treating RN: 16-Oct-1965 (57 y.o. 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

## 2023-02-16 ENCOUNTER — Ambulatory Visit
Admission: RE | Admit: 2023-02-16 | Discharge: 2023-02-16 | Disposition: A | Discharge status: Home / Self Care | Payer: 59 | Source: Ambulatory Visit | Attending: Physician Assistant | Admitting: Physician Assistant

## 2023-02-16 ENCOUNTER — Telehealth: Payer: Self-pay | Admitting: Gastroenterology

## 2023-02-16 DIAGNOSIS — L97512 Non-pressure chronic ulcer of other part of right foot with fat layer exposed: Secondary | ICD-10-CM | POA: Diagnosis not present

## 2023-02-16 DIAGNOSIS — L97519 Non-pressure chronic ulcer of other part of right foot with unspecified severity: Secondary | ICD-10-CM | POA: Diagnosis not present

## 2023-02-16 DIAGNOSIS — L02611 Cutaneous abscess of right foot: Secondary | ICD-10-CM | POA: Diagnosis not present

## 2023-02-16 DIAGNOSIS — R600 Localized edema: Secondary | ICD-10-CM | POA: Diagnosis not present

## 2023-02-16 DIAGNOSIS — Z89411 Acquired absence of right great toe: Secondary | ICD-10-CM | POA: Diagnosis not present

## 2023-02-16 MED ORDER — GADOBUTROL 1 MMOL/ML IV SOLN
10.0000 mL | Freq: Once | INTRAVENOUS | Status: AC | PRN
  Administered 2023-02-16: 10 mL via INTRAVENOUS

## 2023-02-16 NOTE — Telephone Encounter (Signed)
Pt wanted ask about if she would need to wait for her procedure until after her issues with the foot is resolved... Explained to pt that as long as she feels okay, it is okay to proceed with procedures... Pt will call back if she wants to r/s

## 2023-02-16 NOTE — Telephone Encounter (Addendum)
The patient called wanting to know if she needs to reschedule her procedure. The patient is currently taking anabiotic for an affection that she has and, she is not sure if it is in her foot or in the bone in her foot. The patient has to take the medication for month.

## 2023-02-17 NOTE — Progress Notes (Signed)
education on ulcer and skin care Treatment Activities: Referred to DME Tashyra Adduci for dressing supplies : 01/13/2023 Notes: Electronic Signature(s) Signed: 02/15/2023 10:07:17 AM By: Midge Aver MSN RN CNS WTA Entered By: Midge Aver on 02/15/2023 10:07:17 -------------------------------------------------------------------------------- Pain Assessment Details Patient Name: Date of Service: [name] [name]. 02/15/2023 9:15 A M Medical Record Number: [id number] Patient Account  Number: [id number] Date of Birth/Sex: Treating RN: Jan 16, 1966 (57 y.o. [name] Primary Care Victorious Kundinger: Lynnea Ferrier Other Clinician: Referring Tniya Bowditch: Treating Elick Aguilera/Extender: Wilma Flavin Weeks in Treatment: 4 Active Problems Location of Pain Severity and Description of Pain Patient Has Paino No Site Locations Pain Management and Medication Current Pain Management: [name], SIDES ([id number]) [id number] Page 8 of 10 Electronic Signature(s) Signed: 02/15/2023 11:25:40 AM By: Midge Aver MSN RN CNS WTA Entered By: Midge Aver on 02/15/2023 11:25:40 -------------------------------------------------------------------------------- Patient/Caregiver Education Details Patient Name: Date of Service: [name] [name] 10/29/2024andnbsp9:15 A M Medical Record Number: [id number] Patient Account Number: [id number] Date of Birth/Gender: Treating RN: 09/02/1965 (57 y.o. [name] Primary Care Physician: Lynnea Ferrier Other Clinician: Referring Physician: Treating Physician/Extender: Trula Ore in Treatment: 4 Education Assessment Education Provided To: Patient Education Topics Provided Wound/Skin Impairment: Handouts: Caring for Your Ulcer Methods: Explain/Verbal Responses: State content correctly Electronic Signature(s) Signed: 02/16/2023 5:05:03 PM By: Midge Aver MSN RN CNS WTA Entered By: Midge Aver on 02/15/2023 10:07:29 -------------------------------------------------------------------------------- Wound Assessment Details Patient Name: Date of Service: [name] [name]. 02/15/2023 9:15 A M Medical Record Number: [id number] Patient Account Number: [id number] Date of Birth/Sex: Treating RN: November 02, 1965 (57 y.o. 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, Plantar, Posterior Foot Wound Open Wounding Event: Gradually Appeared Status: Date Acquired: 12/06/2022 Comorbid Asthma, Sleep Apnea, Congestive Heart Failure, Hypertension, Weeks Of Treatment: 4 [name] [name], [name] [name] ([id number]) [id number] Page 9 of 10 Weeks Of Treatment: 4 History: Seizure Disorder Clustered Wound: No Photos Wound Measurements Length: (cm) 0.7 Width: (cm) 0.8 Depth: (cm) 1.9 Area: (cm) 0.44 Volume: (cm) 0.836 % Reduction in Area: -14.3% % Reduction in Volume: -985.7% Tunneling: No Undermining: No Wound Description Classification: Full Thickness Without Exposed Suppor Exudate Amount: Medium Exudate Type: Serosanguineous Exudate Color: red, brown t Structures Foul Odor After Cleansing: No Slough/Fibrino No Wound Bed Granulation Amount: Medium (34-66%) Exposed Structure Granulation Quality: Red, Pink Fascia Exposed: No Fat Layer (Subcutaneous Tissue) Exposed: Yes Tendon Exposed: No Muscle Exposed: No Joint Exposed: No Bone Exposed: No Treatment Notes Wound #1 (Foot) Wound Laterality: Plantar, Right, Posterior Cleanser Byram Ancillary Kit - 15 Day Supply Discharge Instruction: Use supplies as instructed; Kit contains: (15) Saline Bullets; (15) 3x3 Gauze; 15 pr Gloves Soap and Water Discharge Instruction: Gently cleanse wound with antibacterial soap, rinse and pat dry prior to dressing wounds Peri-Wound Care Topical Primary Dressing Hydrofera Blue Ready Transfer Foam, 2.5x2.5 (in/in) Discharge Instruction: Apply Hydrofera Blue Ready to wound bed as directed Secondary Dressing ABD Pad 5x9 (in/in) Discharge Instruction: Cover with ABD pad Secured With Medipore T - 85M Medipore H Soft Cloth Surgical T ape ape, 2x2 (in/yd) Kerlix Roll Sterile or Non-Sterile 6-ply 4.5x4 (yd/yd) Discharge Instruction: Apply Kerlix as  directed Compression Wrap Compression Stockings Add-Ons Electronic Signature(s) Signed: 02/16/2023 5:05:03 PM By: Midge Aver MSN RN CNS [address] [name] ([id number]) [id number] Page 10 of 10 Entered By: Midge Aver on 02/15/2023 09:39:35 -------------------------------------------------------------------------------- Vitals  education on ulcer and skin care Treatment Activities: Referred to DME Tashyra Adduci for dressing supplies : 01/13/2023 Notes: Electronic Signature(s) Signed: 02/15/2023 10:07:17 AM By: Midge Aver MSN RN CNS WTA Entered By: Midge Aver on 02/15/2023 10:07:17 -------------------------------------------------------------------------------- Pain Assessment Details Patient Name: Date of Service: [name] [name]. 02/15/2023 9:15 A M Medical Record Number: [id number] Patient Account  Number: [id number] Date of Birth/Sex: Treating RN: Jan 16, 1966 (57 y.o. [name] Primary Care Victorious Kundinger: Lynnea Ferrier Other Clinician: Referring Tniya Bowditch: Treating Elick Aguilera/Extender: Wilma Flavin Weeks in Treatment: 4 Active Problems Location of Pain Severity and Description of Pain Patient Has Paino No Site Locations Pain Management and Medication Current Pain Management: [name], SIDES ([id number]) [id number].pdf Page 8 of 10 Electronic Signature(s) Signed: 02/15/2023 11:25:40 AM By: Midge Aver MSN RN CNS WTA Entered By: Midge Aver on 02/15/2023 11:25:40 -------------------------------------------------------------------------------- Patient/Caregiver Education Details Patient Name: Date of Service: [name] [name] 10/29/2024andnbsp9:15 A M Medical Record Number: [id number] Patient Account Number: [id number] Date of Birth/Gender: Treating RN: 09/02/1965 (57 y.o. [name] Primary Care Physician: Lynnea Ferrier Other Clinician: Referring Physician: Treating Physician/Extender: Trula Ore in Treatment: 4 Education Assessment Education Provided To: Patient Education Topics Provided Wound/Skin Impairment: Handouts: Caring for Your Ulcer Methods: Explain/Verbal Responses: State content correctly Electronic Signature(s) Signed: 02/16/2023 5:05:03 PM By: Midge Aver MSN RN CNS WTA Entered By: Midge Aver on 02/15/2023 10:07:29 -------------------------------------------------------------------------------- Wound Assessment Details Patient Name: Date of Service: [name] [name]. 02/15/2023 9:15 A M Medical Record Number: [id number] Patient Account Number: [id number] Date of Birth/Sex: Treating RN: November 02, 1965 (57 y.o. 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, Plantar, Posterior Foot Wound Open Wounding Event: Gradually Appeared Status: Date Acquired: 12/06/2022 Comorbid Asthma, Sleep Apnea, Congestive Heart Failure, Hypertension, Weeks Of Treatment: 4 [name] [name], [name] [name] ([id number]) [id number] Page 9 of 10 Weeks Of Treatment: 4 History: Seizure Disorder Clustered Wound: No Photos Wound Measurements Length: (cm) 0.7 Width: (cm) 0.8 Depth: (cm) 1.9 Area: (cm) 0.44 Volume: (cm) 0.836 % Reduction in Area: -14.3% % Reduction in Volume: -985.7% Tunneling: No Undermining: No Wound Description Classification: Full Thickness Without Exposed Suppor Exudate Amount: Medium Exudate Type: Serosanguineous Exudate Color: red, brown t Structures Foul Odor After Cleansing: No Slough/Fibrino No Wound Bed Granulation Amount: Medium (34-66%) Exposed Structure Granulation Quality: Red, Pink Fascia Exposed: No Fat Layer (Subcutaneous Tissue) Exposed: Yes Tendon Exposed: No Muscle Exposed: No Joint Exposed: No Bone Exposed: No Treatment Notes Wound #1 (Foot) Wound Laterality: Plantar, Right, Posterior Cleanser Byram Ancillary Kit - 15 Day Supply Discharge Instruction: Use supplies as instructed; Kit contains: (15) Saline Bullets; (15) 3x3 Gauze; 15 pr Gloves Soap and Water Discharge Instruction: Gently cleanse wound with antibacterial soap, rinse and pat dry prior to dressing wounds Peri-Wound Care Topical Primary Dressing Hydrofera Blue Ready Transfer Foam, 2.5x2.5 (in/in) Discharge Instruction: Apply Hydrofera Blue Ready to wound bed as directed Secondary Dressing ABD Pad 5x9 (in/in) Discharge Instruction: Cover with ABD pad Secured With Medipore T - 85M Medipore H Soft Cloth Surgical T ape ape, 2x2 (in/yd) Kerlix Roll Sterile or Non-Sterile 6-ply 4.5x4 (yd/yd) Discharge Instruction: Apply Kerlix as  directed Compression Wrap Compression Stockings Add-Ons Electronic Signature(s) Signed: 02/16/2023 5:05:03 PM By: Midge Aver MSN RN CNS [address] [name] ([id number]) [id number] Page 10 of 10 Entered By: Midge Aver on 02/15/2023 09:39:35 -------------------------------------------------------------------------------- Vitals  Details Patient Name: Date of Service: [name] [name], [name] [name] 02/15/2023 9:15 A M Medical Record Number: [id number] Patient Account Number: [id number] Date of Birth/Sex: Treating RN: 08-20-1965 (57 y.o. [name] Primary Care Mellisa Arshad: Lynnea Ferrier Other Clinician: Referring Tommy Goostree: Treating Damita Eppard/Extender: Trula Ore in Treatment: 4 Vital Signs Time Taken: 09:09 Temperature (F): 97.7 Height (in): 64 Pulse (bpm): 73 Weight (lbs): 235 Respiratory Rate (breaths/min): 18 Body Mass Index (BMI): 40.3 Blood Pressure (mmHg): 129/70 Reference Range: 80 - 120 mg / dl Electronic Signature(s) Signed: 02/16/2023 5:05:03 PM By: Midge Aver MSN RN CNS WTA Entered By: Midge Aver on 02/15/2023 09:11:53  Details Patient Name: Date of Service: [name] [name], [name] [name] 02/15/2023 9:15 A M Medical Record Number: [id number] Patient Account Number: [id number] Date of Birth/Sex: Treating RN: 08-20-1965 (57 y.o. [name] Primary Care Mellisa Arshad: Lynnea Ferrier Other Clinician: Referring Tommy Goostree: Treating Damita Eppard/Extender: Trula Ore in Treatment: 4 Vital Signs Time Taken: 09:09 Temperature (F): 97.7 Height (in): 64 Pulse (bpm): 73 Weight (lbs): 235 Respiratory Rate (breaths/min): 18 Body Mass Index (BMI): 40.3 Blood Pressure (mmHg): 129/70 Reference Range: 80 - 120 mg / dl Electronic Signature(s) Signed: 02/16/2023 5:05:03 PM By: Midge Aver MSN RN CNS WTA Entered By: Midge Aver on 02/15/2023 09:11:53  Neuropathic Ulcer-Non Diabetic N/A N/A Primary Etiology: Asthma, Sleep Apnea, Congestive N/A N/A Comorbid History: Heart Failure, Hypertension, Seizure Disorder 12/06/2022 N/A N/A Date Acquired: 4 N/A N/A Weeks of Treatment: Open N/A N/A Wound Status: No N/A N/A Wound Recurrence: 0.7x0.8x1.9 N/A N/A Measurements L x W x [name] (cm) 0.44 N/A N/A A (cm) : rea 0.836 N/A N/A Volume (cm) : -14.30% N/A N/A % Reduction in Area: -985.70% N/A N/A % Reduction in Volume: Full Thickness Without Exposed N/A N/A Classification: Support Structures Medium N/A N/A Exudate Amount: Serosanguineous N/A N/A Exudate Type: red, brown N/A N/A Exudate Color: Medium (34-66%) N/A N/A Granulation Amount: Red, Pink N/A N/A Granulation Quality: Fat Layer (Subcutaneous Tissue): Yes N/A N/A Exposed Structures: Fascia: No Tendon: No Muscle: No Joint: No Bone: No Treatment Notes Wound #1 (Foot) Wound Laterality: Plantar, Right, Posterior Cleanser Byram Ancillary Kit - 15 Day Supply Discharge Instruction: Use supplies as instructed; Kit contains: (15) Saline Bullets;  (15) 3x3 Gauze; 15 pr Gloves Soap and Water Discharge Instruction: Gently cleanse wound with antibacterial soap, rinse and pat dry prior to dressing wounds Peri-Wound Care Topical Primary Dressing Hydrofera Blue Ready Transfer Foam, 2.5x2.5 (in/in) Discharge Instruction: Apply Hydrofera Blue Ready to wound bed as directed Secondary Dressing ABD Pad 5x9 (in/in) Discharge Instruction: Cover with ABD pad [name] [name], [name] [name] ([id number]) [id number] Page 6 of 10 Secured With Medipore T - 30M Medipore H Soft Cloth Surgical T ape ape, 2x2 (in/yd) Kerlix Roll Sterile or Non-Sterile 6-ply 4.5x4 (yd/yd) Discharge Instruction: Apply Kerlix as directed Compression Wrap Compression Stockings Add-Ons Electronic Signature(s) Signed: 02/15/2023 10:05:05 AM By: Midge Aver MSN RN CNS WTA Previous Signature: 02/15/2023 9:24:47 AM Version By: Midge Aver MSN RN CNS WTA Entered By: Midge Aver on 02/15/2023 10:05:05 -------------------------------------------------------------------------------- Multi-Disciplinary Care Plan Details Patient Name: Date of Service: [name] [name]. 02/15/2023 9:15 A M Medical Record Number: [id number] Patient Account Number: [id number] Date of Birth/Sex: Treating RN: 08-22-1965 (57 y.o. [name] Primary Care Tine Mabee: Lynnea Ferrier Other Clinician: Referring Macon Lesesne: Treating Hallelujah Wysong/Extender: Trula Ore in Treatment: 4 Active Inactive Necrotic Tissue Nursing Diagnoses: Impaired tissue integrity related to necrotic/devitalized tissue Knowledge deficit related to management of necrotic/devitalized tissue Goals: Necrotic/devitalized tissue will be minimized in the wound bed Date Initiated: 01/13/2023 Date Inactivated: 02/15/2023 Target Resolution Date: 02/12/2023 Goal Status: Met Patient/caregiver will verbalize understanding of reason and process for debridement of necrotic tissue Date Initiated:  01/13/2023 Target Resolution Date: 03/15/2023 Goal Status: Active Interventions: Assess patient pain level pre-, during and post procedure and prior to discharge Provide education on necrotic tissue and debridement process Treatment Activities: Apply topical anesthetic as ordered : 01/13/2023 Excisional debridement : 01/13/2023 Notes: Wound/Skin Impairment Nursing Diagnoses: Impaired tissue integrity Knowledge deficit related to ulceration/compromised skin integrity Goals: Patient/caregiver will verbalize understanding of skin care regimen Date Initiated: 01/13/2023 Date Inactivated: 02/15/2023 Target Resolution Date: 02/12/2023 [name] [name], [name] [name] ([id number]) [id number] Page 7 of 10 Goal Status: Met Ulcer/skin breakdown will have a volume reduction of 30% by week 4 Date Initiated: 01/13/2023 Date Inactivated: 02/15/2023 Target Resolution Date: 02/12/2023 Goal Status: Met Ulcer/skin breakdown will have a volume reduction of 50% by week 8 Date Initiated: 01/13/2023 Target Resolution Date: 03/15/2023 Goal Status: Active Ulcer/skin breakdown will have a volume reduction of 80% by week 12 Date Initiated: 01/13/2023 Target Resolution Date: 04/14/2023 Goal Status: Active Interventions: Assess patient/caregiver ability to obtain necessary supplies Assess patient/caregiver ability to perform ulcer/skin care regimen upon admission and as needed Assess ulceration(s) every visit Provide

## 2023-02-18 ENCOUNTER — Encounter: Payer: Self-pay | Admitting: Family Medicine

## 2023-02-18 ENCOUNTER — Inpatient Hospital Stay (HOSPITAL_COMMUNITY)
Admission: EM | Admit: 2023-02-18 | Discharge: 2023-02-24 | DRG: 475 | Disposition: A | Discharge status: Skilled Nursing Facility | Payer: 59 | Source: Ambulatory Visit | Attending: Internal Medicine | Admitting: Internal Medicine

## 2023-02-18 ENCOUNTER — Encounter (HOSPITAL_COMMUNITY): Payer: Self-pay

## 2023-02-18 ENCOUNTER — Other Ambulatory Visit: Payer: Self-pay | Admitting: Family Medicine

## 2023-02-18 ENCOUNTER — Other Ambulatory Visit: Payer: Self-pay

## 2023-02-18 ENCOUNTER — Ambulatory Visit (INDEPENDENT_AMBULATORY_CARE_PROVIDER_SITE_OTHER): Payer: 59 | Admitting: Family Medicine

## 2023-02-18 VITALS — BP 132/78 | HR 82 | Temp 98.4°F | Ht 66.0 in | Wt 238.0 lb

## 2023-02-18 DIAGNOSIS — M150 Primary generalized (osteo)arthritis: Secondary | ICD-10-CM | POA: Diagnosis not present

## 2023-02-18 DIAGNOSIS — R110 Nausea: Secondary | ICD-10-CM | POA: Diagnosis not present

## 2023-02-18 DIAGNOSIS — M868X7 Other osteomyelitis, ankle and foot: Principal | ICD-10-CM | POA: Diagnosis present

## 2023-02-18 DIAGNOSIS — M869 Osteomyelitis, unspecified: Secondary | ICD-10-CM | POA: Diagnosis present

## 2023-02-18 DIAGNOSIS — K219 Gastro-esophageal reflux disease without esophagitis: Secondary | ICD-10-CM | POA: Diagnosis not present

## 2023-02-18 DIAGNOSIS — Z7401 Bed confinement status: Secondary | ICD-10-CM | POA: Diagnosis not present

## 2023-02-18 DIAGNOSIS — K5900 Constipation, unspecified: Secondary | ICD-10-CM | POA: Diagnosis not present

## 2023-02-18 DIAGNOSIS — L02611 Cutaneous abscess of right foot: Secondary | ICD-10-CM | POA: Diagnosis not present

## 2023-02-18 DIAGNOSIS — E66813 Obesity, class 3: Secondary | ICD-10-CM | POA: Diagnosis present

## 2023-02-18 DIAGNOSIS — M60073 Infective myositis, right foot: Secondary | ICD-10-CM | POA: Diagnosis present

## 2023-02-18 DIAGNOSIS — N182 Chronic kidney disease, stage 2 (mild): Secondary | ICD-10-CM | POA: Diagnosis not present

## 2023-02-18 DIAGNOSIS — M86171 Other acute osteomyelitis, right ankle and foot: Secondary | ICD-10-CM

## 2023-02-18 DIAGNOSIS — F319 Bipolar disorder, unspecified: Secondary | ICD-10-CM | POA: Diagnosis present

## 2023-02-18 DIAGNOSIS — F419 Anxiety disorder, unspecified: Secondary | ICD-10-CM | POA: Diagnosis present

## 2023-02-18 DIAGNOSIS — Z743 Need for continuous supervision: Secondary | ICD-10-CM | POA: Diagnosis not present

## 2023-02-18 DIAGNOSIS — M7731 Calcaneal spur, right foot: Secondary | ICD-10-CM | POA: Diagnosis not present

## 2023-02-18 DIAGNOSIS — N183 Chronic kidney disease, stage 3 unspecified: Secondary | ICD-10-CM | POA: Diagnosis not present

## 2023-02-18 DIAGNOSIS — L089 Local infection of the skin and subcutaneous tissue, unspecified: Secondary | ICD-10-CM | POA: Diagnosis present

## 2023-02-18 DIAGNOSIS — R7303 Prediabetes: Secondary | ICD-10-CM | POA: Diagnosis not present

## 2023-02-18 DIAGNOSIS — Y835 Amputation of limb(s) as the cause of abnormal reaction of the patient, or of later complication, without mention of misadventure at the time of the procedure: Secondary | ICD-10-CM | POA: Diagnosis present

## 2023-02-18 DIAGNOSIS — R262 Difficulty in walking, not elsewhere classified: Secondary | ICD-10-CM | POA: Diagnosis not present

## 2023-02-18 DIAGNOSIS — Z8051 Family history of malignant neoplasm of kidney: Secondary | ICD-10-CM

## 2023-02-18 DIAGNOSIS — R531 Weakness: Secondary | ICD-10-CM | POA: Diagnosis not present

## 2023-02-18 DIAGNOSIS — I10 Essential (primary) hypertension: Secondary | ICD-10-CM | POA: Diagnosis not present

## 2023-02-18 DIAGNOSIS — Z89431 Acquired absence of right foot: Secondary | ICD-10-CM | POA: Diagnosis not present

## 2023-02-18 DIAGNOSIS — I5032 Chronic diastolic (congestive) heart failure: Secondary | ICD-10-CM | POA: Diagnosis present

## 2023-02-18 DIAGNOSIS — M6281 Muscle weakness (generalized): Secondary | ICD-10-CM | POA: Diagnosis not present

## 2023-02-18 DIAGNOSIS — Z833 Family history of diabetes mellitus: Secondary | ICD-10-CM

## 2023-02-18 DIAGNOSIS — Z6841 Body Mass Index (BMI) 40.0 and over, adult: Secondary | ICD-10-CM | POA: Diagnosis not present

## 2023-02-18 DIAGNOSIS — Z23 Encounter for immunization: Secondary | ICD-10-CM

## 2023-02-18 DIAGNOSIS — G629 Polyneuropathy, unspecified: Secondary | ICD-10-CM | POA: Diagnosis present

## 2023-02-18 DIAGNOSIS — G43E19 Chronic migraine with aura, intractable, without status migrainosus: Secondary | ICD-10-CM | POA: Diagnosis not present

## 2023-02-18 DIAGNOSIS — T8743 Infection of amputation stump, right lower extremity: Secondary | ICD-10-CM | POA: Diagnosis not present

## 2023-02-18 DIAGNOSIS — G8929 Other chronic pain: Secondary | ICD-10-CM | POA: Diagnosis present

## 2023-02-18 DIAGNOSIS — Z8249 Family history of ischemic heart disease and other diseases of the circulatory system: Secondary | ICD-10-CM

## 2023-02-18 DIAGNOSIS — J4520 Mild intermittent asthma, uncomplicated: Secondary | ICD-10-CM | POA: Diagnosis not present

## 2023-02-18 DIAGNOSIS — Z885 Allergy status to narcotic agent status: Secondary | ICD-10-CM

## 2023-02-18 DIAGNOSIS — I130 Hypertensive heart and chronic kidney disease with heart failure and stage 1 through stage 4 chronic kidney disease, or unspecified chronic kidney disease: Secondary | ICD-10-CM | POA: Diagnosis not present

## 2023-02-18 DIAGNOSIS — E8809 Other disorders of plasma-protein metabolism, not elsewhere classified: Secondary | ICD-10-CM | POA: Diagnosis not present

## 2023-02-18 DIAGNOSIS — Z825 Family history of asthma and other chronic lower respiratory diseases: Secondary | ICD-10-CM

## 2023-02-18 DIAGNOSIS — J302 Other seasonal allergic rhinitis: Secondary | ICD-10-CM | POA: Diagnosis not present

## 2023-02-18 DIAGNOSIS — Z4781 Encounter for orthopedic aftercare following surgical amputation: Secondary | ICD-10-CM | POA: Diagnosis not present

## 2023-02-18 DIAGNOSIS — D500 Iron deficiency anemia secondary to blood loss (chronic): Secondary | ICD-10-CM | POA: Diagnosis not present

## 2023-02-18 DIAGNOSIS — R6889 Other general symptoms and signs: Secondary | ICD-10-CM | POA: Diagnosis not present

## 2023-02-18 DIAGNOSIS — Z96611 Presence of right artificial shoulder joint: Secondary | ICD-10-CM | POA: Diagnosis present

## 2023-02-18 DIAGNOSIS — Z881 Allergy status to other antibiotic agents status: Secondary | ICD-10-CM

## 2023-02-18 DIAGNOSIS — D631 Anemia in chronic kidney disease: Secondary | ICD-10-CM | POA: Diagnosis not present

## 2023-02-18 DIAGNOSIS — Z79899 Other long term (current) drug therapy: Secondary | ICD-10-CM | POA: Diagnosis not present

## 2023-02-18 DIAGNOSIS — G4700 Insomnia, unspecified: Secondary | ICD-10-CM | POA: Diagnosis not present

## 2023-02-18 DIAGNOSIS — J45909 Unspecified asthma, uncomplicated: Secondary | ICD-10-CM | POA: Diagnosis present

## 2023-02-18 DIAGNOSIS — E1169 Type 2 diabetes mellitus with other specified complication: Secondary | ICD-10-CM | POA: Diagnosis not present

## 2023-02-18 DIAGNOSIS — Z888 Allergy status to other drugs, medicaments and biological substances status: Secondary | ICD-10-CM

## 2023-02-18 DIAGNOSIS — Z83438 Family history of other disorder of lipoprotein metabolism and other lipidemia: Secondary | ICD-10-CM

## 2023-02-18 LAB — COMPREHENSIVE METABOLIC PANEL
ALT: 10 U/L (ref 0–44)
AST: 10 U/L — ABNORMAL LOW (ref 15–41)
Albumin: 3.2 g/dL — ABNORMAL LOW (ref 3.5–5.0)
Alkaline Phosphatase: 72 U/L (ref 38–126)
Anion gap: 11 (ref 5–15)
BUN: 11 mg/dL (ref 6–20)
CO2: 20 mmol/L — ABNORMAL LOW (ref 22–32)
Calcium: 8.9 mg/dL (ref 8.9–10.3)
Chloride: 106 mmol/L (ref 98–111)
Creatinine, Ser: 0.84 mg/dL (ref 0.44–1.00)
GFR, Estimated: >60 mL/min (ref >60)
Glucose, Bld: 113 mg/dL — ABNORMAL HIGH (ref 70–99)
Potassium: 3.8 mmol/L (ref 3.5–5.1)
Sodium: 137 mmol/L (ref 135–145)
Total Bilirubin: 0.3 mg/dL (ref 0.3–1.2)
Total Protein: 7 g/dL (ref 6.5–8.1)

## 2023-02-18 LAB — CBC WITH DIFFERENTIAL/PLATELET
Abs Immature Granulocytes: 0.03 10*3/uL (ref 0.00–0.07)
Basophils Absolute: 0 10*3/uL (ref 0.0–0.1)
Basophils Relative: 0 %
Eosinophils Absolute: 0 10*3/uL (ref 0.0–0.5)
Eosinophils Relative: 0 %
HCT: 29.6 % — ABNORMAL LOW (ref 36.0–46.0)
Hemoglobin: 9.5 g/dL — ABNORMAL LOW (ref 12.0–15.0)
Immature Granulocytes: 0 %
Lymphocytes Relative: 10 %
Lymphs Abs: 0.8 10*3/uL (ref 0.7–4.0)
MCH: 31.7 pg (ref 26.0–34.0)
MCHC: 32.1 g/dL (ref 30.0–36.0)
MCV: 98.7 fL (ref 80.0–100.0)
Monocytes Absolute: 0.7 10*3/uL (ref 0.1–1.0)
Monocytes Relative: 9 %
Neutro Abs: 6.7 10*3/uL (ref 1.7–7.7)
Neutrophils Relative %: 81 %
Platelets: 228 10*3/uL (ref 150–400)
RBC: 3 MIL/uL — ABNORMAL LOW (ref 3.87–5.11)
RDW: 12.7 % (ref 11.5–15.5)
WBC: 8.3 10*3/uL (ref 4.0–10.5)
nRBC: 0 % (ref 0.0–0.2)

## 2023-02-18 LAB — I-STAT CG4 LACTIC ACID, ED
Lactic Acid, Venous: 0.9 mmol/L (ref 0.5–1.9)
Lactic Acid, Venous: 0.8 mmol/L (ref 0.5–1.9)

## 2023-02-18 MED ORDER — ENOXAPARIN SODIUM 60 MG/0.6ML IJ SOSY
50.0000 mg | PREFILLED_SYRINGE | INTRAMUSCULAR | Status: DC
  Administered 2023-02-18 – 2023-02-23 (×6): 50 mg via SUBCUTANEOUS
  Filled 2023-02-18 (×8): qty 0.6

## 2023-02-18 MED ORDER — VANCOMYCIN HCL IN DEXTROSE 1-5 GM/200ML-% IV SOLN
1000.0000 mg | Freq: Once | INTRAVENOUS | Status: AC
  Administered 2023-02-18: 1000 mg via INTRAVENOUS
  Filled 2023-02-18: qty 200

## 2023-02-18 MED ORDER — OXYCODONE HCL 5 MG PO TABS
5.0000 mg | ORAL_TABLET | Freq: Four times a day (QID) | ORAL | Status: DC | PRN
  Administered 2023-02-18 – 2023-02-24 (×14): 5 mg via ORAL
  Filled 2023-02-18 (×14): qty 1

## 2023-02-18 MED ORDER — OXYCODONE-ACETAMINOPHEN 5-325 MG PO TABS
1.0000 | ORAL_TABLET | Freq: Four times a day (QID) | ORAL | Status: DC | PRN
  Administered 2023-02-21 – 2023-02-24 (×6): 1 via ORAL
  Filled 2023-02-18 (×6): qty 1

## 2023-02-18 MED ORDER — BUSPIRONE HCL 5 MG PO TABS
15.0000 mg | ORAL_TABLET | Freq: Three times a day (TID) | ORAL | Status: DC
  Administered 2023-02-18 – 2023-02-24 (×17): 15 mg via ORAL
  Filled 2023-02-18 (×17): qty 1

## 2023-02-18 MED ORDER — PIPERACILLIN-TAZOBACTAM 3.375 G IVPB
3.3750 g | Freq: Three times a day (TID) | INTRAVENOUS | Status: DC
Start: 2023-02-18 — End: 2023-02-21
  Administered 2023-02-18 – 2023-02-21 (×8): 3.375 g via INTRAVENOUS
  Filled 2023-02-18 (×8): qty 50

## 2023-02-18 MED ORDER — ONDANSETRON HCL 4 MG PO TABS: 4.0000 mg | ORAL_TABLET | Freq: Four times a day (QID) | ORAL | Status: DC | PRN

## 2023-02-18 MED ORDER — OXYCODONE-ACETAMINOPHEN 10-325 MG PO TABS: 1.0000 | ORAL_TABLET | Freq: Four times a day (QID) | ORAL | Status: DC | PRN

## 2023-02-18 MED ORDER — OYSTER SHELL CALCIUM/D3 500-5 MG-MCG PO TABS
1.0000 | ORAL_TABLET | Freq: Every day | ORAL | Status: DC
  Administered 2023-02-19 – 2023-02-24 (×6): 1 via ORAL
  Filled 2023-02-18 (×6): qty 1

## 2023-02-18 MED ORDER — ONDANSETRON HCL 4 MG/2ML IJ SOLN
4.0000 mg | Freq: Four times a day (QID) | INTRAMUSCULAR | Status: DC | PRN
  Administered 2023-02-18 – 2023-02-22 (×2): 4 mg via INTRAVENOUS
  Filled 2023-02-18 (×2): qty 2

## 2023-02-18 MED ORDER — TOPIRAMATE 25 MG PO TABS
50.0000 mg | ORAL_TABLET | Freq: Two times a day (BID) | ORAL | Status: DC
  Administered 2023-02-18 – 2023-02-24 (×12): 50 mg via ORAL
  Filled 2023-02-18 (×12): qty 2

## 2023-02-18 MED ORDER — SENNOSIDES-DOCUSATE SODIUM 8.6-50 MG PO TABS
1.0000 | ORAL_TABLET | Freq: Every evening | ORAL | Status: DC | PRN
  Administered 2023-02-20 – 2023-02-24 (×3): 1 via ORAL
  Filled 2023-02-18 (×3): qty 1

## 2023-02-18 MED ORDER — METOPROLOL SUCCINATE ER 25 MG PO TB24
25.0000 mg | ORAL_TABLET | Freq: Every day | ORAL | Status: DC
  Administered 2023-02-19 – 2023-02-24 (×5): 25 mg via ORAL
  Filled 2023-02-18 (×7): qty 1

## 2023-02-18 MED ORDER — PANTOPRAZOLE SODIUM 40 MG PO TBEC
40.0000 mg | DELAYED_RELEASE_TABLET | Freq: Two times a day (BID) | ORAL | Status: DC
  Administered 2023-02-19 – 2023-02-24 (×11): 40 mg via ORAL
  Filled 2023-02-18 (×11): qty 1

## 2023-02-18 MED ORDER — HALOPERIDOL 1 MG PO TABS
5.0000 mg | ORAL_TABLET | Freq: Every day | ORAL | Status: DC
  Administered 2023-02-18 – 2023-02-23 (×6): 5 mg via ORAL
  Filled 2023-02-18 (×6): qty 5

## 2023-02-18 MED ORDER — ADULT MULTIVITAMIN W/MINERALS CH
1.0000 | ORAL_TABLET | Freq: Every day | ORAL | Status: DC
  Administered 2023-02-19 – 2023-02-24 (×6): 1 via ORAL
  Filled 2023-02-18 (×6): qty 1

## 2023-02-18 MED ORDER — METHOCARBAMOL 500 MG PO TABS
500.0000 mg | ORAL_TABLET | Freq: Three times a day (TID) | ORAL | Status: DC | PRN
  Administered 2023-02-22 – 2023-02-23 (×2): 500 mg via ORAL
  Filled 2023-02-18 (×2): qty 1

## 2023-02-18 MED ORDER — PREGABALIN 100 MG PO CAPS
100.0000 mg | ORAL_CAPSULE | Freq: Three times a day (TID) | ORAL | Status: DC
  Administered 2023-02-18 – 2023-02-24 (×17): 100 mg via ORAL
  Filled 2023-02-18 (×17): qty 1

## 2023-02-18 MED ORDER — ACETAMINOPHEN 650 MG RE SUPP: 650.0000 mg | Freq: Four times a day (QID) | RECTAL | Status: DC | PRN

## 2023-02-18 MED ORDER — PRAZOSIN HCL 2 MG PO CAPS
2.0000 mg | ORAL_CAPSULE | Freq: Every day | ORAL | Status: DC
  Administered 2023-02-18 – 2023-02-22 (×5): 2 mg via ORAL
  Filled 2023-02-18 (×7): qty 1

## 2023-02-18 MED ORDER — ACETAMINOPHEN 500 MG PO TABS
1000.0000 mg | ORAL_TABLET | ORAL | Status: AC
  Administered 2023-02-18: 1000 mg via ORAL
  Filled 2023-02-18: qty 2

## 2023-02-18 MED ORDER — LAMOTRIGINE 25 MG PO TABS
100.0000 mg | ORAL_TABLET | Freq: Every day | ORAL | Status: DC
  Administered 2023-02-19 – 2023-02-24 (×6): 100 mg via ORAL
  Filled 2023-02-18 (×6): qty 4

## 2023-02-18 MED ORDER — ESTRADIOL 0.5 MG PO TABS
1.0000 mg | ORAL_TABLET | Freq: Every day | ORAL | Status: DC
  Administered 2023-02-18 – 2023-02-23 (×6): 1 mg via ORAL
  Filled 2023-02-18 (×7): qty 2

## 2023-02-18 MED ORDER — ESCITALOPRAM OXALATE 10 MG PO TABS
10.0000 mg | ORAL_TABLET | Freq: Every day | ORAL | Status: DC
  Administered 2023-02-19 – 2023-02-24 (×6): 10 mg via ORAL
  Filled 2023-02-18 (×6): qty 1

## 2023-02-18 MED ORDER — LORATADINE 10 MG PO TABS
10.0000 mg | ORAL_TABLET | Freq: Every evening | ORAL | Status: DC
  Administered 2023-02-18 – 2023-02-23 (×6): 10 mg via ORAL
  Filled 2023-02-18 (×6): qty 1

## 2023-02-18 MED ORDER — TRAZODONE HCL 50 MG PO TABS
200.0000 mg | ORAL_TABLET | Freq: Every day | ORAL | Status: DC
  Administered 2023-02-18 – 2023-02-23 (×6): 200 mg via ORAL
  Filled 2023-02-18 (×6): qty 4

## 2023-02-18 MED ORDER — PIPERACILLIN-TAZOBACTAM 3.375 G IVPB 30 MIN
3.3750 g | Freq: Once | INTRAVENOUS | Status: AC
  Administered 2023-02-18: 3.375 g via INTRAVENOUS
  Filled 2023-02-18: qty 50

## 2023-02-18 MED ORDER — VANCOMYCIN HCL 1250 MG/250ML IV SOLN
1250.0000 mg | INTRAVENOUS | Status: DC
  Administered 2023-02-19 – 2023-02-20 (×2): 1250 mg via INTRAVENOUS
  Filled 2023-02-18 (×2): qty 250

## 2023-02-18 MED ORDER — FESOTERODINE FUMARATE ER 4 MG PO TB24
4.0000 mg | ORAL_TABLET | Freq: Every day | ORAL | Status: DC
  Administered 2023-02-19 – 2023-02-24 (×6): 4 mg via ORAL
  Filled 2023-02-18 (×6): qty 1

## 2023-02-18 MED ORDER — INFLUENZA VIRUS VACC SPLIT PF (FLUZONE) 0.5 ML IM SUSY
0.5000 mL | PREFILLED_SYRINGE | INTRAMUSCULAR | Status: AC
  Administered 2023-02-19: 0.5 mL via INTRAMUSCULAR
  Filled 2023-02-18: qty 0.5

## 2023-02-18 MED ORDER — ACETAMINOPHEN 325 MG PO TABS: 650.0000 mg | ORAL_TABLET | Freq: Four times a day (QID) | ORAL | Status: DC | PRN

## 2023-02-18 MED ORDER — FAMOTIDINE 20 MG PO TABS
40.0000 mg | ORAL_TABLET | Freq: Every day | ORAL | Status: DC
  Administered 2023-02-18 – 2023-02-23 (×6): 40 mg via ORAL
  Filled 2023-02-18 (×6): qty 2

## 2023-02-18 NOTE — ED Triage Notes (Signed)
Ongoing right foot infection x 2 months. 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 Pt states it started 2 months ago with an ulcer then progressed to open, draining wound to right foot. Went to PCP today and was sent her for wound infection radiating to bone from recent MRI. Denies fever/chills.

## 2023-02-18 NOTE — Progress Notes (Signed)
Subjective:    Patient ID: [name] [name], female    DOB: [birth year], 57 y.o.   MRN: [id number]   Patient had an MRI of the foot 2 days ago: MPRESSION: 1. Postsurgical changes of amputation of the first through fifth metatarsals at the proximal metaphysis of the first metatarsal and proximal shaft of the second through fifth metatarsals. 2. There is a 1.0 x 2.3 x 4.7 cm abscess distal and plantar to the first metatarsal, which appears to drain through a small 5 mm ulcer within the distal postsurgical stump soft tissues. 3. Focal marrow edema and enhancement within the distal approximately 8 mm of the remaining postsurgical proximal aspect of the second metatarsal suspicious for focal acute osteomyelitis. 4. Minimal increased T2 signal is seen at the distal amputation sites of the third through fifth metatarsals, compatible with reactive change, however early acute osteomyelitis is difficult to exclude. 5. There is high-grade edema throughout the flexor digitorum brevis distal musculature, which may be secondary to neurogenic and/or infectious myositis.   This showed osteomyelitis in the second metatarsal.  She also had an abscess forming near the first metatarsal.  The skin now is erythematous warm and swollen.  Patient states she is feeling weak and tired and nauseated.  She has no feeling in her feet so she denies pain in her feet.  She denies any fevers or chills.  She has an appointment Monday to see the wound clinic. Past Medical History:  Diagnosis Date  . Acute metabolic encephalopathy 06/08/2022  . AMS (altered mental status) 05/22/2022  . Anxiety   . Arthritis    Phreesia 02/08/2020  . Asthma    mild intermittent  . Asthma    Phreesia 02/08/2020  . Bipolar 1 disorder ([name])    ect treatments last treatment Sep 02 1011  . Depression   . Depression    Phreesia 02/08/2020  . Depression    Phreesia 07/10/2020  . GERD (gastroesophageal reflux disease)   . Headache    . Hypertension   . Pre-diabetes   . Seizures ([name])    Thinks it was from taking Tramadol  . Septic shock ([name]) 05/22/2022  . Shortness of breath 09/23/2016   Overview:   exertional  . Sleep apnea    AHI 40 (2024)  . Substance abuse ([name])    Phreesia 02/08/2020  . Syncope 08/26/2019  . Syncope and collapse    Past Surgical History:  Procedure Laterality Date  . ABDOMINAL HYSTERECTOMY    . AMPUTATION TOE Left 02/02/2018   Procedure: AMPUTATION TOE Left 4th toe;  Surgeon: Vivi Barrack, DPM;  Location: [facility] OR;  Service: Podiatry;  Laterality: Left;  . APPENDECTOMY N/A    Phreesia 02/08/2020  . BACK SURGERY  2018   ACDF   C5-6 & C6-7 by Dr. Sharolyn Douglas  . CARPAL TUNNEL RELEASE     x2  . I & D EXTREMITY Right 05/22/2022   Procedure: IRRIGATION AND DEBRIDEMENT EXTREMITY;  Surgeon: Candelaria Stagers, DPM;  Location: [facility] ORS;  Service: Podiatry;  Laterality: Right;  . INCISION AND DRAINAGE Right 05/24/2022   Procedure: RIGHT REVISION INCISION AND DRAINAGE/ WASHOUT WITH PRIMARY DELAYED CLOSURE;  Surgeon: Edwin Cap, DPM;  Location: [facility] ORS;  Service: Podiatry;  Laterality: Right;  . IRRIGATION AND DEBRIDEMENT ABSCESS Right 10/25/2020   Procedure: IRRIGATION AND DEBRIDEMENT ABSCESS OF FOOT AND APPLICATION OF GRAFT;  Surgeon: Vivi Barrack, DPM;  Location: [facility] OR;  Service: Podiatry;  Laterality: Right;  .  Laproscopic knee surgery Right   . NECK SURGERY  03/15/2017   ACDF   by Dr. Noel Gerold  . PLANTAR FASCIA RELEASE     x2  . REVERSE SHOULDER ARTHROPLASTY Right 04/14/2022   Procedure: REVERSE SHOULDER ARTHROPLASTY;  Surgeon: Bjorn Pippin, MD;  Location: [facility];  Service: Orthopedics;  Laterality: Right;  . 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   Procedure: TRANSMETATARSAL AMPUTATION;  Surgeon: Vivi Barrack, DPM;  Location: [facility];  Service: Podiatry;  Laterality: Right;  . TRANSMETATARSAL AMPUTATION Right 05/27/2022   Procedure: REVISION TRANSMETATARSAL AMPUTATION WITH  DELAYED PRIMARY CLOSURE;  Surgeon: Felecia Shelling, DPM;  Location: [facility] OR[facility];  Service: Podiatry;  Laterality: Right;   Current Outpatient Medications on File Prior to Visit  Medication Sig Dispense Refill  . albuterol (PROAIR HFA) 108 (90 Base) MCG/ACT inhaler Inhale 2 puffs = into the lungs every 6 (six) hours as needed for wheezing or shortness of breath. (Patient taking differently: Inhale 2 puffs into the lungs every 6 (six) hours as needed for wheezing or shortness of breath.) 8.5 g 3  . busPIRone (BUSPAR) 15 MG tablet Take 15 mg by mouth 3 (three) times daily.   2  . Calcium Carbonate-Vit D-Min (CALCIUM 1200 PO) Take 1,200 mg by mouth daily.     . DULoxetine (CYMBALTA) 60 MG capsule TAKE 1 CAPSULE BY MOUTH ONCE DAILY *REFILL REQUEST* 90 capsule 0  . Erenumab-aooe (AIMOVIG) 70 MG/ML SOAJ administer 1mL UNDER THE SKIN every 30 DAYS 1 mL 5  . escitalopram (LEXAPRO) 10 MG tablet Take 10 mg by mouth daily.    Marland Kitchen estradiol (ESTRACE) 1 MG tablet TAKE ONE TABLET BY MOUTH EVERYDAY AT BEDTIME 90 tablet 3  . famotidine (PEPCID) 40 MG tablet TAKE 1 TABLET BY MOUTH EVERY DAY AT BEDTIME 90 tablet 1  . ferrous sulfate (FEROSUL) 325 (65 FE) MG tablet TAKE 1 TABLET BY MOUTH AT BREAKFAST AND AT BEDTIME 60 tablet 0  . furosemide (LASIX) 40 MG tablet Take 1 tablet (40 mg total) by mouth daily. 30 tablet 3  . haloperidol (HALDOL) 5 MG tablet Take 5 mg by mouth at bedtime.    . lamoTRIgine (LAMICTAL) 100 MG tablet Take 100 mg by mouth daily.    Marland Kitchen levocetirizine (XYZAL) 5 MG tablet TAKE 1 TABLET BY MOUTH EVERY EVENING 90 tablet 3  . methocarbamol (ROBAXIN) 500 MG tablet Take 500 mg by mouth every 8 (eight) hours as needed.    . metoprolol succinate (TOPROL-XL) 25 MG 24 hr tablet Take 25 mg by mouth daily.    . Multiple Vitamins-Minerals (MULTIVITAMIN WITH MINERALS) tablet Take 1 tablet by mouth daily.    . naloxone (NARCAN) nasal spray 4 mg/0.1 mL Place 0.4 mg into the nose once.    Marland Kitchen  oxyCODONE-acetaminophen (PERCOCET) 10-325 MG tablet Take one tablet by mouth up to 4 times daily as needed.    . pantoprazole (PROTONIX) 40 MG tablet TAKE ONE (1) TABLET BY MOUTH TWICE DAILY 180 tablet 1  . polyethylene glycol (MIRALAX / GLYCOLAX) 17 g packet Take 17 g by mouth daily. 14 each 0  . prazosin (MINIPRESS) 2 MG capsule Take 2 mg by mouth at bedtime.    . pregabalin (LYRICA) 100 MG capsule Take 1 capsule (100 mg total) by mouth 2 (two) times daily. (Patient taking differently: Take 100 mg by mouth 3 (three) times daily.) 90 capsule 3  . silver sulfADIAZINE (SILVADENE) 1 % cream Apply 1 Application topically daily. 50 g 1  .  solifenacin (VESICARE) 10 MG tablet TAKE 1 TABLET BY MOUTH ONCE DAILY 90 tablet 1  . sulfamethoxazole-trimethoprim (BACTRIM DS) 800-160 MG tablet Take 1 tablet by mouth 2 (two) times daily.    . SUMAtriptan (IMITREX) 50 MG tablet Take 50 mg by mouth every 2 (two) hours as needed for migraine.    . topiramate (TOPAMAX) 50 MG tablet TAKE ONE (1) TABLET BY MOUTH TWICE DAILY 180 tablet 1  . traZODone (DESYREL) 100 MG tablet Take 1 tablet (100 mg total) by mouth at bedtime. (Patient taking differently: Take 200 mg by mouth at bedtime.) 30 tablet 0   No current facility-administered medications on file prior to visit.   Allergies  Allergen Reactions  . Tetracyclines & Related Other (See Comments)    Syncope and put her "in a coma"  . Tramadol Other (See Comments)    Seizures  . Oxybutynin Other (See Comments)    Unknown reaction per Mom  . Phenazopyridine Other (See Comments)    Unknown reaction  . Ciprofloxacin Rash and Itching  . Codeine Itching and Rash  . Estradiol Rash    Patches broke out the skin   Social History   Socioeconomic History  . Marital status: Widowed    Spouse name: Not on file  . Number of children: 3  . Years of education: 1  . Highest education level: Not on file  Occupational History  . Occupation: Disabled  Tobacco Use  .  Smoking status: Never  . Smokeless tobacco: Never  Vaping Use  . Vaping status: Never Used  Substance and Sexual Activity  . Alcohol use: No  . Drug use: No  . Sexual activity: Not Currently  Other Topics Concern  . Not on file  Social History Narrative   Patient drink 4 16oz caffeine drinks a day    Social Determinants of Health   Financial Resource Strain: Low Risk  (07/27/2022)   Overall Financial Resource Strain (CARDIA)   . Difficulty of Paying Living Expenses: Not hard at all  Food Insecurity: No Food Insecurity (11/19/2022)   Hunger Vital Sign   . Worried About Programme researcher, broadcasting/film/video in the Last Year: Never true   . Ran Out of Food in the Last Year: Never true  Transportation Needs: No Transportation Needs (02/01/2023)   PRAPARE - Transportation   . Lack of Transportation (Medical): No   . Lack of Transportation (Non-Medical): No  Physical Activity: Inactive (07/26/2022)   Exercise Vital Sign   . Days of Exercise per Week: 0 days   . Minutes of Exercise per Session: 0 min  Stress: No Stress Concern Present (11/19/2022)   [employer] of Occupational Health - Occupational Stress Questionnaire   . Feeling of Stress : Not at all  Social Connections: Unknown (08/30/2021)   [facility]   Social Network   . Social Network: Not on file  Intimate Partner Violence: Not At Risk (07/19/2022)   Humiliation, Afraid, Rape, and Kick questionnaire   . Fear of Current or Ex-Partner: No   . Emotionally Abused: No   . Physically Abused: No   . Sexually Abused: No   Family History  Problem Relation Age of Onset  . Diabetes Mother   . COPD Mother   . Hypertension Mother   . Hyperlipidemia Mother   . Sleep apnea Mother   . Heart disease Father   . Hyperlipidemia Father   . Hypertension Father   . Cancer Father   .  Sleep apnea Father   . Heart disease Brother   . Sleep apnea Brother   . Heart disease Maternal Grandmother   . Hypertension Maternal  Grandmother   . Sleep apnea Maternal Grandmother   . Heart disease Maternal Grandfather   . Kidney cancer Paternal Grandmother   . Heart disease Paternal Grandfather   . Heart disease Daughter       Review of Systems  All other systems reviewed and are negative.      Objective:   Physical Exam Vitals reviewed.  Constitutional:      General: She is not in acute distress.    Appearance: She is well-developed. She is not diaphoretic.  HENT:     Head: Normocephalic and atraumatic.     Right Ear: External ear normal.     Left Ear: External ear normal.  Neck:     Thyroid: No thyromegaly.     Trachea: No tracheal deviation.  Cardiovascular:     Rate and Rhythm: Normal rate and regular rhythm.     Heart sounds: Normal heart sounds. No murmur heard.    No friction rub. No gallop.  Pulmonary:     Effort: Pulmonary effort is normal. No respiratory distress.     Breath sounds: No stridor. No decreased breath sounds, wheezing, rhonchi or rales.  Chest:     Chest wall: No tenderness.  Abdominal:     Palpations: Abdomen is soft.  Musculoskeletal:     Cervical back: Normal range of motion and neck supple.  Lymphadenopathy:     Cervical: No cervical adenopathy.  Skin:    General: Skin is warm.     Coloration: Skin is not pale.     Findings: Erythema and lesion present. No rash.  Neurological:     Mental Status: She is alert and oriented to person, place, and time.     Cranial Nerves: No cranial nerve deficit.     Motor: No abnormal muscle tone.     Coordination: Coordination normal.     Deep Tendon Reflexes: Reflexes are normal and symmetric.  Patient has erythema and warmth with swelling in the right foot from the midfoot to the stump.  There is purulent drainage coming from an ulcer on the bottom portion of the foot.  There is induration and swelling.        Assessment & Plan:  Other acute osteomyelitis of right foot ([name]) I recommended the patient go to the emergency  room for IV antibiotics and consultation with orthopedic surgery.  Patient agrees.

## 2023-02-18 NOTE — ED Provider Notes (Signed)
[place] EMERGENCY DEPARTMENT AT [facility] Provider Note   CSN: [id number] Arrival date & time: 02/18/23  1337     History  Chief Complaint  Patient presents with   Wound Infection    [name] [name] is a 57 y.o. female.  HPI 57 year old female history of GERD, hypertension, asthma, prior transmetatarsal potation of the right foot for infection presenting for wound infection.  Patient has been following with her primary care doctor and wound care.  The wound care doctor was concerned that the wound is tracking deeper so get an MRI on October 30.  MRI showed an abscess distal and plantar to the first metatarsal adjacent to an ulcer as well as signs concerning for acute osteomyelitis.  She was seen here for evaluation of admission.  She has been on clindamycin which she last took last night.  No fevers but just does not feel well.  Not feeling any shortness of breath or chest pain.  No vomiting.  Denies history of diabetes.     Home Medications Prior to Admission medications   Medication Sig Start Date End Date Taking? Authorizing Provider  busPIRone (BUSPAR) 15 MG tablet Take 15 mg by mouth 3 (three) times daily.  04/26/17  Yes [provider]  Calcium Carbonate-Vit D-Min (CALCIUM 1200 PO) Take 1,200 mg by mouth daily.    Yes [provider]  Erenumab-aooe (AIMOVIG) 70 MG/ML SOAJ administer 1mL UNDER THE SKIN every 30 DAYS 10/08/22  Yes Donita Brooks, MD  escitalopram (LEXAPRO) 10 MG tablet Take 10 mg by mouth daily. 08/23/19  Yes [provider]  estradiol (ESTRACE) 1 MG tablet TAKE ONE TABLET BY MOUTH EVERYDAY AT BEDTIME Patient taking differently: Take 1 mg by mouth at bedtime. 07/19/22  Yes Donita Brooks, MD  famotidine (PEPCID) 40 MG tablet TAKE 1 TABLET BY MOUTH EVERY DAY AT BEDTIME 12/28/22  Yes Donita Brooks, MD  ferrous sulfate (FEROSUL) 325 (65 FE) MG tablet TAKE 1 TABLET BY MOUTH AT BREAKFAST AND AT BEDTIME Patient taking  differently: 325 mg 2 (two) times daily with a meal. 01/13/23  Yes Donita Brooks, MD  haloperidol (HALDOL) 5 MG tablet Take 5 mg by mouth at bedtime. 11/11/22  Yes [provider]  lamoTRIgine (LAMICTAL) 100 MG tablet Take 100 mg by mouth daily. 08/06/21  Yes [provider]  levocetirizine (XYZAL) 5 MG tablet TAKE 1 TABLET BY MOUTH EVERY EVENING 12/27/22  Yes Donita Brooks, MD  methocarbamol (ROBAXIN) 500 MG tablet Take 500 mg by mouth every 8 (eight) hours as needed.   Yes [provider]  metoprolol succinate (TOPROL-XL) 25 MG 24 hr tablet Take 25 mg by mouth daily. 10/04/22  Yes [provider]  Multiple Vitamins-Minerals (MULTIVITAMIN WITH MINERALS) tablet Take 1 tablet by mouth daily.   Yes [provider]  naloxone (NARCAN) nasal spray 4 mg/0.1 mL Place 0.4 mg into the nose once. 08/03/21  Yes [provider]  oxyCODONE-acetaminophen (PERCOCET) 10-325 MG tablet Take one tablet by mouth up to 4 times daily as needed.   Yes [provider]  pantoprazole (PROTONIX) 40 MG tablet TAKE ONE (1) TABLET BY MOUTH TWICE DAILY Patient taking differently: Take 40 mg by mouth 2 (two) times daily. 12/28/22  Yes Donita Brooks, MD  prazosin (MINIPRESS) 2 MG capsule Take 2 mg by mouth at bedtime.   Yes [provider]  pregabalin (LYRICA) 100 MG capsule Take 1 capsule (100 mg total) by mouth  2 (two) times daily. Patient taking differently: Take 100 mg by mouth 3 (three) times daily. 07/27/21  Yes Donita Brooks, MD  solifenacin (VESICARE) 10 MG tablet TAKE 1 TABLET BY MOUTH ONCE DAILY 12/28/22  Yes Donita Brooks, MD  SUMAtriptan (IMITREX) 50 MG tablet Take 50 mg by mouth every 2 (two) hours as needed for migraine. 08/30/19  Yes [provider]  topiramate (TOPAMAX) 50 MG tablet TAKE ONE (1) TABLET BY MOUTH TWICE DAILY Patient taking differently: Take 50 mg by mouth 2 (two) times daily. 12/28/22  Yes Donita Brooks, MD   traZODone (DESYREL) 100 MG tablet Take 1 tablet (100 mg total) by mouth at bedtime. Patient taking differently: Take 200 mg by mouth at bedtime. 2 tablets at bedtime. 07/12/22  Yes Kathlen Mody, MD  sulfamethoxazole-trimethoprim (BACTRIM DS) 800-160 MG tablet Take 1 tablet by mouth 2 (two) times daily. 02/17/23   [provider]      Allergies    Tetracyclines & related, Tramadol, Oxybutynin, Phenazopyridine, Ciprofloxacin, Codeine, and Estradiol    Review of Systems   Review of Systems Review of systems completed and notable as per HPI.  ROS otherwise negative.   Physical Exam Updated Vital Signs BP 118/63 (BP Location: Right Arm)   Pulse 77   Temp 98.4 F (36.9 C) (Oral)   Resp 15   Ht 5\' 4"  (1.626 m)   Wt 108.4 kg   SpO2 96%   BMI 41.02 kg/m  Physical Exam Vitals and nursing note reviewed.  Constitutional:      General: She is not in acute distress.    Appearance: She is well-developed.  HENT:     Head: Normocephalic and atraumatic.  Eyes:     Conjunctiva/sclera: Conjunctivae normal.  Cardiovascular:     Rate and Rhythm: Normal rate and regular rhythm.     Pulses: Normal pulses.     Heart sounds: Normal heart sounds. No murmur heard. Pulmonary:     Effort: Pulmonary effort is normal. No respiratory distress.     Breath sounds: Normal breath sounds.  Abdominal:     Palpations: Abdomen is soft.     Tenderness: There is no abdominal tenderness.  Musculoskeletal:        General: No swelling.     Cervical back: Neck supple.     Right lower leg: No edema.     Left lower leg: No edema.     Comments: Right foot status post transmetatarsal amputation.  Small wound.  No active drainage.  Erythema.  No crepitus.  Skin:    General: Skin is warm and dry.     Capillary Refill: Capillary refill takes less than 2 seconds.  Neurological:     Mental Status: She is alert.  Psychiatric:        Mood and Affect: Mood normal.     ED Results / Procedures / Treatments    Labs (all labs ordered are listed, but only abnormal results are displayed) Labs Reviewed  CBC WITH DIFFERENTIAL/PLATELET - Abnormal; Notable for the following components:      Result Value   RBC 3.00 (*)    Hemoglobin 9.5 (*)    HCT 29.6 (*)    All other components within normal limits  COMPREHENSIVE METABOLIC PANEL - Abnormal; Notable for the following components:   CO2 20 (*)    Glucose, Bld 113 (*)    Albumin 3.2 (*)    AST 10 (*)    All other components within normal  limits  CULTURE, BLOOD (ROUTINE X 2)  CULTURE, BLOOD (ROUTINE X 2)  I-STAT CG4 LACTIC ACID, ED  I-STAT CG4 LACTIC ACID, ED    EKG None  Radiology MR FOOT RIGHT W WO CONTRAST  Result Date: 02/16/2023 CLINICAL DATA:  History of toe amputation in 2020. Ulcer on bottom of right foot for 2.5 months. EXAM: MRI OF THE RIGHT FOREFOOT WITHOUT AND WITH CONTRAST TECHNIQUE: Multiplanar, multisequence MR imaging of the right hindfoot and midfoot was performed before and after the administration of intravenous contrast. CONTRAST:  10mL GADAVIST GADOBUTROL 1 MMOL/ML IV SOLN COMPARISON:  Right foot radiographs 05/27/2022, CT right foot 05/22/2022, right foot radiographs 05/22/2022, MRI right forefoot 10/22/2020 FINDINGS: Bones/Joint/Cartilage Postsurgical changes are again seen of amputation of the first through fifth metatarsals at the proximal metaphysis of the first metatarsal and proximal shaft of the second through fifth metatarsals. There is moderate subcutaneous fat edema, enhancement, and swelling within the distal medial soft tissue stump, predominantly around the first through third metatarsals. There is rim enhancing fluid suspicious for an abscess distal and plantar to the first metatarsal, measuring up to approximately 1.0 x 2.3 x 4.7 cm (long axis of the foot by transverse by short axis of the foot dimensions, sagittal series 12 image 9 and coronal 1030 series, image 15). This abscess appears to drain through a small 5  mm ulcer within the distal postsurgical stump soft tissues (coronal image 16 and sagittal image 9) plantar and distal to the first and second metatarsal stumps. There is wenhancing surrounding inflammatory phlegmon extending from this region plantar to the second and third postsurgical metatarsal bases. There is focal edema and enhancement within the distal approximately 8 mm of the remaining postsurgical proximal aspect of the second metatarsal suspicious for focal acute osteomyelitis. Minimal increased T2 signal is seen at the distal amputation sites of the third through fifth metatarsals, compatible with reactive change, however early acute osteomyelitis is difficult to exclude. Ligaments The Lisfranc ligament complex is intact. Muscles and Tendons There is high-grade edema throughout the flexor digitorum brevis distal musculature, which may be secondary to neurogenic and/or infectious myositis. Mild edema within the extensor digitorum brevis muscle. Soft tissues Please see above. There is metallic susceptibility artifact at the distal lateral postsurgical stump soft tissues (axial series 11, image 26 and coronal series 1023 image 6, nonspecific and possibly external to the patient. IMPRESSION: 1. Postsurgical changes of amputation of the first through fifth metatarsals at the proximal metaphysis of the first metatarsal and proximal shaft of the second through fifth metatarsals. 2. There is a 1.0 x 2.3 x 4.7 cm abscess distal and plantar to the first metatarsal, which appears to drain through a small 5 mm ulcer within the distal postsurgical stump soft tissues. 3. Focal marrow edema and enhancement within the distal approximately 8 mm of the remaining postsurgical proximal aspect of the second metatarsal suspicious for focal acute osteomyelitis. 4. Minimal increased T2 signal is seen at the distal amputation sites of the third through fifth metatarsals, compatible with reactive change, however early acute  osteomyelitis is difficult to exclude. 5. There is high-grade edema throughout the flexor digitorum brevis distal musculature, which may be secondary to neurogenic and/or infectious myositis. Electronically Signed   By: Neita Garnet M.D.   On: 02/16/2023 19:02    Procedures Procedures    Medications Ordered in ED Medications  vancomycin (VANCOCIN) IVPB 1000 mg/200 mL premix (0 mg Intravenous Stopped 02/18/23 1629)    Followed by  vancomycin (  VANCOCIN) IVPB 1000 mg/200 mL premix (has no administration in time range)  piperacillin-tazobactam (ZOSYN) IVPB 3.375 g (0 g Intravenous Stopped 02/18/23 1445)    Followed by  piperacillin-tazobactam (ZOSYN) IVPB 3.375 g (has no administration in time range)    ED Course/ Medical Decision Making/ A&P Clinical Course as of 02/18/23 1630  Fri Feb 18, 2023  1546 Spoke with Dr. Ralene Cork with podiatry who agreed with medical admission, patient will be evaluated by podiatry. [JD]    Clinical Course User Index [JD] Laurence Spates, MD                                 Medical Decision Making Amount and/or Complexity of Data Reviewed Labs: ordered.  Risk Prescription drug management. Decision regarding hospitalization.   Medical Decision Making:   [name] [name] is a 57 y.o. female who presented to the ED today with concern for osteomyelitis of the right foot.  Vital signs reviewed.  On exam she is well-appearing, no signs of sepsis or systemic illness.  However she is having worsening erythema, now is developed abscess and osteomyelitis of the right foot concerning for treatment failure.  Will order antibiotics, discussed with podiatry who she follows with and plan for admission.   Patient placed on continuous vitals and telemetry monitoring while in ED which was reviewed periodically.  Reviewed and confirmed nursing documentation for past medical history, family history, social history.  Reassessment and Plan:   Discussed with Dr. Ralene Cork with  podiatry who will evaluate the patient agrees with medical admission.  Discussed with hospitalist and admitted.   Patient's presentation is most consistent with acute complicated illness / injury requiring diagnostic workup.           Final Clinical Impression(s) / ED Diagnoses Final diagnoses:  Other osteomyelitis of right foot [facility])    Rx / DC Orders ED Discharge Orders     None         Laurence Spates, MD 02/18/23 1630

## 2023-02-18 NOTE — ED Notes (Signed)
ED TO INPATIENT HANDOFF REPORT  ED Nurse Name and Phone #:  Molli Hazard [phone]  S Name/Age/Gender [name] [name] 57 y.o. female Room/Bed: 009C/009C  Code Status   Code Status: Prior  Home/SNF/Other Home Patient oriented to: self, place, time, and situation Is this baseline? Yes   Triage Complete: Triage complete  Chief Complaint Foot Infection  Triage Note Ongoing right foot infection x 2 months. 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 Pt states it started 2 months ago with an ulcer then progressed to open, draining wound to right foot. Went to PCP today and was sent her for wound infection radiating to bone from recent MRI. Denies fever/chills.    Allergies Allergies  Allergen Reactions   Tetracyclines & Related Other (See Comments)    Syncope and put her "in a coma"   Tramadol Other (See Comments)    Seizures   Oxybutynin Other (See Comments)    Unknown reaction per Mom   Phenazopyridine Other (See Comments)    Unknown reaction   Ciprofloxacin Rash and Itching   Codeine Itching and Rash   Estradiol Rash    Patches broke out the skin    Level of Care/Admitting Diagnosis ED Disposition     ED Disposition  Admit   Condition  --   Comment  The patient appears reasonably stabilized for admission considering the current resources, flow, and capabilities available in the ED at this time, and I doubt any other [facility] requiring further screening and/or treatment in the ED prior to admission is  present.          B Medical/Surgery History Past Medical History:  Diagnosis Date   Acute metabolic encephalopathy 06/08/2022   AMS (altered mental status) 05/22/2022   Anxiety    Arthritis    Phreesia 02/08/2020   Asthma    mild intermittent   Asthma    Phreesia 02/08/2020   Bipolar 1 disorder ([name])    ect treatments last treatment Sep 02 1011   Depression    Depression    Phreesia 02/08/2020   Depression    Phreesia 07/10/2020   GERD (gastroesophageal reflux disease)     Headache    Hypertension    Pre-diabetes    Seizures ([name])    Thinks it was from taking Tramadol   Septic shock ([name]) 05/22/2022   Shortness of breath 09/23/2016   Overview:   exertional   Sleep apnea    AHI 40 (2024)   Substance abuse ([name])    Phreesia 02/08/2020   Syncope 08/26/2019   Syncope and collapse    Past Surgical History:  Procedure Laterality Date   ABDOMINAL HYSTERECTOMY     AMPUTATION TOE Left 02/02/2018   Procedure: AMPUTATION TOE Left 4th toe;  Surgeon: Vivi Barrack, DPM;  Location: [facility] OR;  Service: Podiatry;  Laterality: Left;   APPENDECTOMY N/A    Phreesia 02/08/2020   BACK SURGERY  2018   ACDF   C5-6 & C6-7 by Dr. Sharolyn Douglas   CARPAL TUNNEL RELEASE     x2   I & D EXTREMITY Right 05/22/2022   Procedure: IRRIGATION AND DEBRIDEMENT EXTREMITY;  Surgeon: Candelaria Stagers, DPM;  Location: [facility] ORS;  Service: Podiatry;  Laterality: Right;   INCISION AND DRAINAGE Right 05/24/2022   Procedure: RIGHT REVISION INCISION AND DRAINAGE/ WASHOUT WITH PRIMARY DELAYED CLOSURE;  Surgeon: Edwin Cap, DPM;  Location: [facility] ORS;  Service: Podiatry;  Laterality: Right;   IRRIGATION AND DEBRIDEMENT ABSCESS Right 10/25/2020  Procedure: IRRIGATION AND DEBRIDEMENT ABSCESS OF FOOT AND APPLICATION OF GRAFT;  Surgeon: Vivi Barrack, DPM;  Location: [facility] OR;  Service: Podiatry;  Laterality: Right;   Laproscopic knee surgery Right    NECK SURGERY  03/15/2017   ACDF   by Dr. Noel Gerold   PLANTAR FASCIA RELEASE     x2   REVERSE SHOULDER ARTHROPLASTY Right 04/14/2022   Procedure: REVERSE SHOULDER ARTHROPLASTY;  Surgeon: Bjorn Pippin, MD;  Location: [facility] OR[facility];  Service: Orthopedics;  Laterality: Right;   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   Procedure: TRANSMETATARSAL AMPUTATION;  Surgeon: Vivi Barrack, DPM;  Location: [facility] OR[facility];  Service: Podiatry;  Laterality: Right;   TRANSMETATARSAL AMPUTATION Right 05/27/2022   Procedure: REVISION TRANSMETATARSAL AMPUTATION WITH DELAYED  PRIMARY CLOSURE;  Surgeon: Felecia Shelling, DPM;  Location: [facility] ORS;  Service: Podiatry;  Laterality: Right;     A IV Location/Drains/Wounds Patient Lines/Drains/Airways Status     Active Line/Drains/Airways     Name Placement date Placement time Site Days   Peripheral IV 02/18/23 20 G Right Antecubital 02/18/23  1353  Antecubital  less than 1   Wound / Incision (Open or Dehisced) Anterior --  --  --  --            Intake/Output Last 24 hours No intake or output data in the 24 hours ending 02/18/23 1622  Labs/Imaging Results for orders placed or performed during the hospital encounter of 02/18/23 (from the past 48 hour(s))  CBC with Differential     Status: Abnormal   Collection Time: 02/18/23  1:53 PM  Result Value Ref Range   WBC 8.3 4.0 - 10.5 K/uL   RBC 3.00 (L) 3.87 - 5.11 MIL/uL   Hemoglobin 9.5 (L) 12.0 - 15.0 g/dL   HCT 16.1 (L) 09.6 - 04.5 %   MCV 98.7 80.0 - 100.0 fL   MCH 31.7 26.0 - 34.0 pg   MCHC 32.1 30.0 - 36.0 g/dL   RDW 40.9 81.1 - 91.4 %   Platelets 228 150 - 400 K/uL   nRBC 0.0 0.0 - 0.2 %   Neutrophils Relative % 81 %   Neutro Abs 6.7 1.7 - 7.7 K/uL   Lymphocytes Relative 10 %   Lymphs Abs 0.8 0.7 - 4.0 K/uL   Monocytes Relative 9 %   Monocytes Absolute 0.7 0.1 - 1.0 K/uL   Eosinophils Relative 0 %   Eosinophils Absolute 0.0 0.0 - 0.5 K/uL   Basophils Relative 0 %   Basophils Absolute 0.0 0.0 - 0.1 K/uL   Immature Granulocytes 0 %   Abs Immature Granulocytes 0.03 0.00 - 0.07 K/uL    Comment: Performed at [facility], [address] [place], [place] [address]  Comprehensive metabolic panel     Status: Abnormal   Collection Time: 02/18/23  1:53 PM  Result Value Ref Range   Sodium 137 135 - 145 mmol/L   Potassium 3.8 3.5 - 5.1 mmol/L   Chloride 106 98 - 111 mmol/L   CO2 20 (L) 22 - 32 mmol/L   Glucose, Bld 113 (H) 70 - 99 mg/dL    Comment: Glucose reference range applies only to samples taken after fasting for at least 8 hours.   BUN  11 6 - 20 mg/dL   Creatinine, Ser 6.21 0.44 - 1.00 mg/dL   Calcium 8.9 8.9 - 30.8 mg/dL   Total Protein 7.0 6.5 - 8.1 g/dL   Albumin 3.2 (L) 3.5 - 5.0 g/dL  AST 10 (L) 15 - 41 U/L   ALT 10 0 - 44 U/L   Alkaline Phosphatase 72 38 - 126 U/L   Total Bilirubin 0.3 0.3 - 1.2 mg/dL   GFR, Estimated >65 >78 mL/min    Comment: (NOTE) Calculated using the CKD-EPI Creatinine Equation (2021)    Anion gap 11 5 - 15    Comment: Performed at [facility], [address], [place], [place] [address]  I-Stat CG4 Lactic Acid     Status: None   Collection Time: 02/18/23  2:20 PM  Result Value Ref Range   Lactic Acid, Venous 0.9 0.5 - 1.9 mmol/L  I-Stat CG4 Lactic Acid     Status: None   Collection Time: 02/18/23  3:56 PM  Result Value Ref Range   Lactic Acid, Venous 0.8 0.5 - 1.9 mmol/L   *Note: Due to a large number of results and/or encounters for the requested time period, some results have not been displayed. A complete set of results can be found in Results Review.   MR FOOT RIGHT W WO CONTRAST  Result Date: 02/16/2023 CLINICAL DATA:  History of toe amputation in 2020. Ulcer on bottom of right foot for 2.5 months. EXAM: MRI OF THE RIGHT FOREFOOT WITHOUT AND WITH CONTRAST TECHNIQUE: Multiplanar, multisequence MR imaging of the right hindfoot and midfoot was performed before and after the administration of intravenous contrast. CONTRAST:  10mL GADAVIST GADOBUTROL 1 MMOL/ML IV SOLN COMPARISON:  Right foot radiographs 05/27/2022, CT right foot 05/22/2022, right foot radiographs 05/22/2022, MRI right forefoot 10/22/2020 FINDINGS: Bones/Joint/Cartilage Postsurgical changes are again seen of amputation of the first through fifth metatarsals at the proximal metaphysis of the first metatarsal and proximal shaft of the second through fifth metatarsals. There is moderate subcutaneous fat edema, enhancement, and swelling within the distal medial soft tissue stump, predominantly around the first through  third metatarsals. There is rim enhancing fluid suspicious for an abscess distal and plantar to the first metatarsal, measuring up to approximately 1.0 x 2.3 x 4.7 cm (long axis of the foot by transverse by short axis of the foot dimensions, sagittal series 12 image 9 and coronal 1030 series, image 15). This abscess appears to drain through a small 5 mm ulcer within the distal postsurgical stump soft tissues (coronal image 16 and sagittal image 9) plantar and distal to the first and second metatarsal stumps. There is wenhancing surrounding inflammatory phlegmon extending from this region plantar to the second and third postsurgical metatarsal bases. There is focal edema and enhancement within the distal approximately 8 mm of the remaining postsurgical proximal aspect of the second metatarsal suspicious for focal acute osteomyelitis. Minimal increased T2 signal is seen at the distal amputation sites of the third through fifth metatarsals, compatible with reactive change, however early acute osteomyelitis is difficult to exclude. Ligaments The Lisfranc ligament complex is intact. Muscles and Tendons There is high-grade edema throughout the flexor digitorum brevis distal musculature, which may be secondary to neurogenic and/or infectious myositis. Mild edema within the extensor digitorum brevis muscle. Soft tissues Please see above. There is metallic susceptibility artifact at the distal lateral postsurgical stump soft tissues (axial series 11, image 26 and coronal series 1023 image 6, nonspecific and possibly external to the patient. IMPRESSION: 1. Postsurgical changes of amputation of the first through fifth metatarsals at the proximal metaphysis of the first metatarsal and proximal shaft of the second through fifth metatarsals. 2. There is a 1.0 x 2.3 x 4.7 cm abscess distal  and plantar to the first metatarsal, which appears to drain through a small 5 mm ulcer within the distal postsurgical stump soft tissues. 3.  Focal marrow edema and enhancement within the distal approximately 8 mm of the remaining postsurgical proximal aspect of the second metatarsal suspicious for focal acute osteomyelitis. 4. Minimal increased T2 signal is seen at the distal amputation sites of the third through fifth metatarsals, compatible with reactive change, however early acute osteomyelitis is difficult to exclude. 5. There is high-grade edema throughout the flexor digitorum brevis distal musculature, which may be secondary to neurogenic and/or infectious myositis. Electronically Signed   By: Neita Garnet M.D.   On: 02/16/2023 19:02    Pending Labs Unresulted Labs (From admission, onward)     Start     Ordered   02/18/23 1353  Blood culture (routine x 2)  BLOOD CULTURE X 2,   R (with STAT occurrences)     Question:  Patient immune status  Answer:  Normal   02/18/23 1353            Vitals/Pain Today's Vitals   02/18/23 1343 02/18/23 1508  BP: 118/63   Pulse: 77   Resp: 15   Temp: 98.4 F (36.9 C)   TempSrc: Oral   SpO2: 96%   Weight: 239 lb (108.4 kg)   Height: 5\' 4"  (1.626 m)   PainSc: 0-No pain 6     Isolation Precautions No active isolations  Medications Medications  vancomycin (VANCOCIN) IVPB 1000 mg/200 mL premix (1,000 mg Intravenous New Bag/Given 02/18/23 1448)    Followed by  vancomycin (VANCOCIN) IVPB 1000 mg/200 mL premix (has no administration in time range)  piperacillin-tazobactam (ZOSYN) IVPB 3.375 g (0 g Intravenous Stopped 02/18/23 1445)    Followed by  piperacillin-tazobactam (ZOSYN) IVPB 3.375 g (has no administration in time range)    Mobility walks with device     Focused Assessments Cardiac Assessment Handoff:    Lab Results  Component Value Date   CKTOTAL 13 (L) 06/08/2022   CKMB 0.6 09/23/2009   TROPONINI <0.03 11/24/2016   Lab Results  Component Value Date   DDIMER 0.35 03/16/2022   Does the Patient currently have chest pain? No   , Neuro Assessment  Handoff:  Swallow screen pass? Yes          Neuro Assessment:   Neuro Checks:      Has TPA been given? No If patient is a Neuro Trauma and patient is going to OR before floor call report to 4N Charge nurse: [phone] or [phone]  , Renal Assessment Handoff:  Hemodialysis Schedule:  Last Hemodialysis date and time:    Restricted appendage:   , Pulmonary Assessment Handoff:  Lung sounds:   O2 Device: Room Air      R Recommendations: See Admitting Provider Note  Report given to:   Additional Notes: Pt has been pleasant and meds have been given. Has small hole in right foot with green pus oozing from site.

## 2023-02-18 NOTE — H&P (Signed)
History and Physical    Patient: [name] [name] ZOX:[id number] DOB: [birth year] DOA: 02/18/2023 DOS: the patient was seen and examined on 02/18/2023 PCP: Donita Brooks, MD  Patient coming from: Home  Chief Complaint:  Chief Complaint  Patient presents with   Wound Infection   HPI: [name] [name] is a 57 y.o. female with medical history significant of HTN, bipolar disorder, OSA, CKD stage II, chronic diastolic heart failure, chronic pain, asthma, anxiety and depression, GERD, prediabetes and s/p right transmetatarsal amputation who presented to the ED due to concern for osteomyelitis of the right foot.  Patient reports that she had a an infection on her right lower extremity stump 2 months ago that progressed from an ulcer to an open draining wound.  She has been going to the wound care center for wound care.  During her last evaluation on 10/29, there was a concern for progression to deep bone infection so an MRI was obtained on 10/30.  MRI results showed signs of acute osteomyelitis and during patient's visit to her PCP today, she was advised to come to the ED for IV antibiotics and further management of her right foot infection.  She denies any pain from the right lower extremity but reports some chills, nausea and loss of appetite.  She denies any vomiting, abdominal pain, chest pain, shortness of breath, leg swelling, dizziness or headache.  She has baseline dyspnea on exertion.  ED course: Hemodynamically stable.  Labs show WBC 8.3, Hgb 9.5, platelet 228, normal kidney function and electrolytes. Lactic acid 0.9->0.8. Patient was given a dose of Zosyn and vancomycin. Podiatry was consulted for evaluation and hospitalist was consulted for admission.  Review of Systems: As mentioned in the history of present illness. All other systems reviewed and are negative. Past Medical History:  Diagnosis Date   Acute metabolic encephalopathy 06/08/2022   AMS (altered mental status) 05/22/2022    Anxiety    Arthritis    Phreesia 02/08/2020   Asthma    mild intermittent   Asthma    Phreesia 02/08/2020   Bipolar 1 disorder ([name])    ect treatments last treatment Sep 02 1011   Depression    Depression    Phreesia 02/08/2020   Depression    Phreesia 07/10/2020   GERD (gastroesophageal reflux disease)    Headache    Hypertension    Pre-diabetes    Seizures ([name])    Thinks it was from taking Tramadol   Septic shock ([name]) 05/22/2022   Shortness of breath 09/23/2016   Overview:   exertional   Sleep apnea    AHI 40 (2024)   Substance abuse ([name])    Phreesia 02/08/2020   Syncope 08/26/2019   Syncope and collapse    Past Surgical History:  Procedure Laterality Date   ABDOMINAL HYSTERECTOMY     AMPUTATION TOE Left 02/02/2018   Procedure: AMPUTATION TOE Left 4th toe;  Surgeon: Vivi Barrack, DPM;  Location: [facility];  Service: Podiatry;  Laterality: Left;   APPENDECTOMY N/A    Phreesia 02/08/2020   BACK SURGERY  2018   ACDF   C5-6 & C6-7 by Dr. Sharolyn Douglas   CARPAL TUNNEL RELEASE     x2   I & D EXTREMITY Right 05/22/2022   Procedure: IRRIGATION AND DEBRIDEMENT EXTREMITY;  Surgeon: Candelaria Stagers, DPM;  Location: [facility];  Service: Podiatry;  Laterality: Right;   INCISION AND DRAINAGE Right 05/24/2022   Procedure: RIGHT REVISION INCISION AND DRAINAGE/ WASHOUT WITH  PRIMARY DELAYED CLOSURE;  Surgeon: Edwin Cap, DPM;  Location: [facility];  Service: Podiatry;  Laterality: Right;   IRRIGATION AND DEBRIDEMENT ABSCESS Right 10/25/2020   Procedure: IRRIGATION AND DEBRIDEMENT ABSCESS OF FOOT AND APPLICATION OF GRAFT;  Surgeon: Vivi Barrack, DPM;  Location: [facility];  Service: Podiatry;  Laterality: Right;   Laproscopic knee surgery Right    NECK SURGERY  03/15/2017   ACDF   by Dr. Noel Gerold   PLANTAR FASCIA RELEASE     x2   REVERSE SHOULDER ARTHROPLASTY Right 04/14/2022   Procedure: REVERSE SHOULDER ARTHROPLASTY;  Surgeon: Bjorn Pippin, MD;  Location: [facility];  Service:  Orthopedics;  Laterality: Right;   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   Procedure: TRANSMETATARSAL AMPUTATION;  Surgeon: Vivi Barrack, DPM;  Location: [facility];  Service: Podiatry;  Laterality: Right;   TRANSMETATARSAL AMPUTATION Right 05/27/2022   Procedure: REVISION TRANSMETATARSAL AMPUTATION WITH DELAYED PRIMARY CLOSURE;  Surgeon: Felecia Shelling, DPM;  Location: [facility];  Service: Podiatry;  Laterality: Right;   Social History:  reports that she has never smoked. She has never used smokeless tobacco. She reports that she does not drink alcohol and does not use drugs.  Allergies  Allergen Reactions   Tetracyclines & Related Other (See Comments)    Syncope and put her "in a coma"   Tramadol Other (See Comments)    Seizures   Oxybutynin Other (See Comments)    Unknown reaction per Mom   Phenazopyridine Other (See Comments)    Unknown reaction   Ciprofloxacin Rash and Itching   Codeine Itching and Rash   Estradiol Rash    Patches broke out the skin    Family History  Problem Relation Age of Onset   Diabetes Mother    COPD Mother    Hypertension Mother    Hyperlipidemia Mother    Sleep apnea Mother    Heart disease Father    Hyperlipidemia Father    Hypertension Father    Cancer Father    Sleep apnea Father    Heart disease Brother    Sleep apnea Brother    Heart disease Maternal Grandmother    Hypertension Maternal Grandmother    Sleep apnea Maternal Grandmother    Heart disease Maternal Grandfather    Kidney cancer Paternal Grandmother    Heart disease Paternal Grandfather    Heart disease Daughter     Prior to Admission medications   Medication Sig Start Date End Date Taking? Authorizing Provider  busPIRone (BUSPAR) 15 MG tablet Take 15 mg by mouth 3 (three) times daily.  04/26/17  Yes [provider]  Calcium Carbonate-Vit D-Min (CALCIUM 1200 PO) Take 1,200 mg by mouth daily.    Yes [provider]  Erenumab-aooe (AIMOVIG) 70 MG/ML SOAJ  administer 1mL UNDER THE SKIN every 30 DAYS 10/08/22  Yes Donita Brooks, MD  escitalopram (LEXAPRO) 10 MG tablet Take 10 mg by mouth daily. 08/23/19  Yes [provider]  estradiol (ESTRACE) 1 MG tablet TAKE ONE TABLET BY MOUTH EVERYDAY AT BEDTIME Patient taking differently: Take 1 mg by mouth at bedtime. 07/19/22  Yes Donita Brooks, MD  famotidine (PEPCID) 40 MG tablet TAKE 1 TABLET BY MOUTH EVERY DAY AT BEDTIME 12/28/22  Yes Donita Brooks, MD  ferrous sulfate (FEROSUL) 325 (65 FE) MG tablet TAKE 1 TABLET BY MOUTH AT BREAKFAST AND AT BEDTIME Patient taking differently: 325 mg 2 (two) times daily with a meal. 01/13/23  Yes Donita Brooks,  MD  haloperidol (HALDOL) 5 MG tablet Take 5 mg by mouth at bedtime. 11/11/22  Yes [provider]  lamoTRIgine (LAMICTAL) 100 MG tablet Take 100 mg by mouth daily. 08/06/21  Yes [provider]  levocetirizine (XYZAL) 5 MG tablet TAKE 1 TABLET BY MOUTH EVERY EVENING 12/27/22  Yes Donita Brooks, MD  methocarbamol (ROBAXIN) 500 MG tablet Take 500 mg by mouth every 8 (eight) hours as needed.   Yes [provider]  metoprolol succinate (TOPROL-XL) 25 MG 24 hr tablet Take 25 mg by mouth daily. 10/04/22  Yes [provider]  Multiple Vitamins-Minerals (MULTIVITAMIN WITH MINERALS) tablet Take 1 tablet by mouth daily.   Yes [provider]  naloxone (NARCAN) nasal spray 4 mg/0.1 mL Place 0.4 mg into the nose once. 08/03/21  Yes [provider]  oxyCODONE-acetaminophen (PERCOCET) 10-325 MG tablet Take one tablet by mouth up to 4 times daily as needed.   Yes [provider]  pantoprazole (PROTONIX) 40 MG tablet TAKE ONE (1) TABLET BY MOUTH TWICE DAILY Patient taking differently: Take 40 mg by mouth 2 (two) times daily. 12/28/22  Yes Donita Brooks, MD  prazosin (MINIPRESS) 2 MG capsule Take 2 mg by mouth at bedtime.   Yes [provider]  pregabalin (LYRICA) 100 MG capsule Take 1  capsule (100 mg total) by mouth 2 (two) times daily. Patient taking differently: Take 100 mg by mouth 3 (three) times daily. 07/27/21  Yes Donita Brooks, MD  solifenacin (VESICARE) 10 MG tablet TAKE 1 TABLET BY MOUTH ONCE DAILY 12/28/22  Yes Donita Brooks, MD  SUMAtriptan (IMITREX) 50 MG tablet Take 50 mg by mouth every 2 (two) hours as needed for migraine. 08/30/19  Yes [provider]  topiramate (TOPAMAX) 50 MG tablet TAKE ONE (1) TABLET BY MOUTH TWICE DAILY Patient taking differently: Take 50 mg by mouth 2 (two) times daily. 12/28/22  Yes Donita Brooks, MD  traZODone (DESYREL) 100 MG tablet Take 1 tablet (100 mg total) by mouth at bedtime. Patient taking differently: Take 200 mg by mouth at bedtime. 2 tablets at bedtime. 07/12/22  Yes Kathlen Mody, MD  sulfamethoxazole-trimethoprim (BACTRIM DS) 800-160 MG tablet Take 1 tablet by mouth 2 (two) times daily. 02/17/23   [provider]    Physical Exam: Vitals:   02/18/23 1343 02/18/23 1715 02/18/23 1827  BP: 118/63 116/69 120/62  Pulse: 77 69 69  Resp: 15 16 16   Temp: 98.4 F (36.9 C)  99.1 F (37.3 C)  TempSrc: Oral  Oral  SpO2: 96% 99% 100%  Weight: 108.4 kg    Height: 5\' 4"  (1.626 m)    General: Pleasant, obese middle-age woman laying in bed. No acute distress. Head: Normocephalic. Atraumatic. CV: RRR. No murmurs, rubs, or gallops. No LE edema Pulmonary: Lungs CTAB. Normal effort. No wheezing or rales. Abdominal: Soft, nontender, nondistended. Normal bowel sounds. Extremities: Right foot s/p transmetatarsal amputation with small wound inferiorly with mild erythema but no active drainage. Amputated left fourth toe. 2+ left pedal pulses. Skin: Warm and dry. Small wound at the bottom of the right foot stump with mild erythema and fluctuance but no drainage or crepitus. Neuro: A&Ox3. Moves all extremities. Normal sensation. No focal deficit. Psych: Normal mood and affect    Data Reviewed:  CBC with mild  anemia but no leukocytosis CMP shows normal kidney function  Assessment and Plan: [name] [name] is a 57 y.o. female with medical history significant of HTN, bipolar disorder,  OSA, CKD stage II, chronic diastolic heart failure, chronic pain, asthma, anxiety and depression, GERD, prediabetes and s/p right transmetatarsal amputation who presented to the ED due to concern for osteomyelitis of the right foot.   # Osteomyelitis of the right foot Prediabetic patient with a history of peripheral neuropathy and right transmetatarsal amputation 3 years ago with recent right foot wound found to have evidence of osteomyelitis, 1.0 x 2.3 x 4.7 cm abscess and infectious myositis on MRI.  Patient is hemodynamically stable with no leukocytosis or fever. She has no significant pain due to peripheral neuropathy but reports some nausea.  Will continue broad-spectrum antibiotics pending further evaluation by podiatry for possible amputation. -Podiatry consulted, appreciate recs -Continue IV vancomycin and Zosyn -As needed Zofran for nausea -Follow-up blood cultures -Trend CBC, fever curve  # HTN # Chronic diastolic HF No evidence of heart failure exacerbation on exam.  BP stable with SBP in the 110s to 130s. -Continue metoprolol  # Chronic pain -Resume pain meds, Lyrica and muscle relaxer  # GERD -Resume famotidine and Protonix  # Bipolar disorder # Anxiety and depression -Resume all psych meds  # CKD 2 -Labs showed normal kidney function and no electrolyte abnormalities  # Class III obesity # Prediabetes Body mass index is 41.02 kg/m.  Last A1c 5.4% 7 months ago. -Nutrition counseling   Advance Care Planning:   Code Status: Full Code   Consults: Podiatry  Family Communication: No family at bedside  Severity of Illness: The appropriate patient status for this patient is INPATIENT. Inpatient status is judged to be reasonable and necessary in order to provide the required intensity of  service to ensure the patient's safety. The patient's presenting symptoms, physical exam findings, and initial radiographic and laboratory data in the context of their chronic comorbidities is felt to place them at high risk for further clinical deterioration. Furthermore, it is not anticipated that the patient will be medically stable for discharge from the hospital within 2 midnights of admission.   * I certify that at the point of admission it is my clinical judgment that the patient will require inpatient hospital care spanning beyond 2 midnights from the point of admission due to high intensity of service, high risk for further deterioration and high frequency of surveillance required.*  Author: Steffanie Rainwater, MD 02/18/2023 6:56 PM  For on call review [email or web address].

## 2023-02-18 NOTE — Progress Notes (Signed)
Pt admitted for ongoing right foot infection x 2 months. 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 Pt states it started 2 months ago with an ulcer then progressed to open, draining wound to right foot. Went to PCP today and was sent her for wound infection radiating to bone from recent MRI. Pt was transported via gurney accompanied by RNs.  Assisted pt into her room and bed.  Reviewed POC and made pt aware of her new surroundings.  Obtained VS which were WNL.  Instructed pt to utilize RN call light for assistance.  Hourly rounds performed.  Bed in lowest position, locked with two upper side rails engaged.  Belongings and call light within reach.

## 2023-02-18 NOTE — Telephone Encounter (Signed)
Requested Prescriptions  Pending Prescriptions Disp Refills   DULoxetine (CYMBALTA) 60 MG capsule [Pharmacy Med Name: DULOXETINE 60MG  CAPSULE 60 Capsule] 90 capsule 0    Sig: TAKE 1 CAPSULE BY MOUTH ONCE DAILY     Psychiatry: Antidepressants - SNRI - duloxetine Failed - 02/18/2023 12:49 PM      Failed - Valid encounter within last 6 months    Recent Outpatient Visits           1 year ago Benign essential HTN   [facility] Pickard, Priscille Heidelberg, MD   1 year ago Infectious diarrhea   [facility] Donita Brooks, MD   1 year ago Blood in stool, frank   [facility] Pickard, Priscille Heidelberg, MD   2 years ago 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)   [facility] Donita Brooks, MD   2 years ago Upper respiratory tract infection, unspecified type   [facility] Valentino Nose, NP              Passed - Cr in normal range and within 360 days    Creat  Date Value Ref Range Status  12/10/2022 0.96 0.50 - 1.03 mg/dL Final   Creatinine, Urine  Date Value Ref Range Status  05/31/2022 203 mg/dL Final    Comment:    Performed at [facility], [address] [place], [place] [address]         Passed - eGFR is 30 or above and within 360 days    GFR, Est African American  Date Value Ref Range Status  06/27/2020 119 > OR = 60 mL/min/1.15m2 Final   GFR, Est Non African American  Date Value Ref Range Status  06/27/2020 102 > OR = 60 mL/min/1.90m2 Final   GFR, Estimated  Date Value Ref Range Status  07/22/2022 49 (L) >60 mL/min Final    Comment:    (NOTE) Calculated using the CKD-EPI Creatinine Equation (2021)    eGFR  Date Value Ref Range Status  12/10/2022 68 > OR = 60 mL/min/1.49m2 Final         Passed - Completed PHQ-2 or PHQ-9 in the last 360 days      Passed - Last BP in normal range    BP Readings from Last 1 Encounters:  02/18/23 118/63

## 2023-02-18 NOTE — Progress Notes (Signed)
ED Pharmacy Antibiotic Sign Off An antibiotic consult was received from an ED provider for vancomycin and zosyn per pharmacy dosing for  osteomyelitis . A chart review was completed to assess appropriateness.  The following one time order(s) were placed per pharmacy consult:  zosyn 3.375 g vancomycin 2000 mg  Further antibiotic and/or antibiotic pharmacy consults should be ordered by the admitting provider if indicated.   Thank you for allowing pharmacy to be a part of this patient's care.   Delmar Landau, PharmD, BCPS 02/18/2023 2:12 PM ED Clinical Pharmacist -  [phone]

## 2023-02-18 NOTE — Progress Notes (Signed)
Pharmacy Antibiotic Note  [name] [name] is a 57 y.o. female admitted on 02/18/2023 with  osteo .  Pharmacy has been consulted for vanc dosing.  Pt with TMA who was admitted for wound/osteo. Vanc/zosyn ordered empirically.  Scr <1  Plan: Vanc 2g x1 then 1.25g IV q24>>AUC 412, scr 0.84 Zosyn 3.375g IV q8 Levels as needed  Height: 5\' 4"  (162.6 cm) Weight: 108.4 kg (239 lb) IBW/kg (Calculated) : 54.7  Temp (24hrs), Avg:98.7 F (37.1 C), Min:98.4 F (36.9 C), Max:99.1 F (37.3 C)  Recent Labs  Lab 02/18/23 1353 02/18/23 1420 02/18/23 1556  WBC 8.3  --   --   CREATININE 0.84  --   --   LATICACIDVEN  --  0.9 0.8    Estimated Creatinine Clearance: 86.7 mL/min (by C-G formula based on SCr of 0.84 mg/dL).    Allergies  Allergen Reactions   Tetracyclines & Related Other (See Comments)    Syncope and put her "in a coma"   Tramadol Other (See Comments)    Seizures   Oxybutynin Other (See Comments)    Unknown reaction per Mom   Phenazopyridine Other (See Comments)    Unknown reaction   Ciprofloxacin Rash and Itching   Codeine Itching and Rash   Estradiol Rash    Patches broke out the skin    Antimicrobials this admission: 11/1 vanc>> 11/1 zosyn>>  Dose adjustments this admission:   Microbiology results: 11/1 blood>>   Ulyses Southward, PharmD, BCIDP, AAHIVP, CPP Infectious Disease Pharmacist 02/18/2023 9:14 PM

## 2023-02-18 NOTE — ED Notes (Signed)
Pt does not meet sepsis criteria at this time. Dr.Davis made aware of pt.

## 2023-02-19 ENCOUNTER — Inpatient Hospital Stay (HOSPITAL_COMMUNITY): Payer: 59 | Admitting: Anesthesiology

## 2023-02-19 ENCOUNTER — Encounter (HOSPITAL_COMMUNITY)
Admission: EM | Disposition: A | Discharge status: Skilled Nursing Facility | Payer: Self-pay | Source: Ambulatory Visit | Attending: Internal Medicine

## 2023-02-19 ENCOUNTER — Inpatient Hospital Stay (HOSPITAL_COMMUNITY): Payer: 59

## 2023-02-19 ENCOUNTER — Other Ambulatory Visit: Payer: Self-pay

## 2023-02-19 ENCOUNTER — Encounter (HOSPITAL_COMMUNITY): Payer: Self-pay | Admitting: Student

## 2023-02-19 DIAGNOSIS — M869 Osteomyelitis, unspecified: Secondary | ICD-10-CM

## 2023-02-19 DIAGNOSIS — M86171 Other acute osteomyelitis, right ankle and foot: Secondary | ICD-10-CM | POA: Diagnosis not present

## 2023-02-19 DIAGNOSIS — M868X7 Other osteomyelitis, ankle and foot: Secondary | ICD-10-CM | POA: Diagnosis not present

## 2023-02-19 HISTORY — PX: AMPUTATION: SHX166

## 2023-02-19 LAB — BASIC METABOLIC PANEL
Anion gap: 9 (ref 5–15)
BUN: 8 mg/dL (ref 6–20)
CO2: 20 mmol/L — ABNORMAL LOW (ref 22–32)
Calcium: 8.4 mg/dL — ABNORMAL LOW (ref 8.9–10.3)
Chloride: 104 mmol/L (ref 98–111)
Creatinine, Ser: 0.93 mg/dL (ref 0.44–1.00)
GFR, Estimated: >60 mL/min (ref >60)
Glucose, Bld: 117 mg/dL — ABNORMAL HIGH (ref 70–99)
Potassium: 3.3 mmol/L — ABNORMAL LOW (ref 3.5–5.1)
Sodium: 133 mmol/L — ABNORMAL LOW (ref 135–145)

## 2023-02-19 LAB — CBC
HCT: 26.4 % — ABNORMAL LOW (ref 36.0–46.0)
Hemoglobin: 8.7 g/dL — ABNORMAL LOW (ref 12.0–15.0)
MCH: 32 pg (ref 26.0–34.0)
MCHC: 33 g/dL (ref 30.0–36.0)
MCV: 97.1 fL (ref 80.0–100.0)
Platelets: 207 10*3/uL (ref 150–400)
RBC: 2.72 MIL/uL — ABNORMAL LOW (ref 3.87–5.11)
RDW: 12.8 % (ref 11.5–15.5)
WBC: 8.5 10*3/uL (ref 4.0–10.5)
nRBC: 0 % (ref 0.0–0.2)

## 2023-02-19 LAB — SURGICAL PCR SCREEN
MRSA, PCR: NEGATIVE
Staphylococcus aureus: POSITIVE — AB

## 2023-02-19 SURGERY — AMPUTATION, FOOT, PARTIAL
Anesthesia: Monitor Anesthesia Care | Site: Foot | Laterality: Right

## 2023-02-19 MED ORDER — CHLORHEXIDINE GLUCONATE 0.12 % MT SOLN: 15.0000 mL | Freq: Once | OROMUCOSAL | Status: AC

## 2023-02-19 MED ORDER — CHLORHEXIDINE GLUCONATE CLOTH 2 % EX PADS
6.0000 | MEDICATED_PAD | Freq: Once | CUTANEOUS | Status: AC
  Administered 2023-02-19: 6 via TOPICAL

## 2023-02-19 MED ORDER — MUPIROCIN 2 % EX OINT
1.0000 | TOPICAL_OINTMENT | Freq: Two times a day (BID) | CUTANEOUS | Status: AC
Start: 2023-02-19 — End: 2023-02-24
  Administered 2023-02-19 – 2023-02-24 (×10): 1 via NASAL
  Filled 2023-02-19 (×4): qty 22

## 2023-02-19 MED ORDER — BUPIVACAINE HCL (PF) 0.5 % IJ SOLN
INTRAMUSCULAR | Status: AC
  Filled 2023-02-19: qty 30

## 2023-02-19 MED ORDER — CHLORHEXIDINE GLUCONATE CLOTH 2 % EX PADS
6.0000 | MEDICATED_PAD | Freq: Every day | CUTANEOUS | Status: DC
Start: 2023-02-20 — End: 2023-02-25
  Administered 2023-02-20 – 2023-02-24 (×4): 6 via TOPICAL

## 2023-02-19 MED ORDER — PROPOFOL 10 MG/ML IV BOLUS
INTRAVENOUS | Status: AC
  Filled 2023-02-19: qty 20

## 2023-02-19 MED ORDER — LACTATED RINGERS IV SOLN: INTRAVENOUS | Status: DC | PRN

## 2023-02-19 MED ORDER — SODIUM CHLORIDE 0.9% FLUSH
10.0000 mL | Freq: Once | INTRAVENOUS | Status: AC
  Administered 2023-02-19: 10 mL via INTRAVENOUS

## 2023-02-19 MED ORDER — 0.9 % SODIUM CHLORIDE (POUR BTL) OPTIME
TOPICAL | Status: DC | PRN
  Administered 2023-02-19: 1000 mL

## 2023-02-19 MED ORDER — CHLORHEXIDINE GLUCONATE 0.12 % MT SOLN
OROMUCOSAL | Status: AC
  Administered 2023-02-19: 15 mL via OROMUCOSAL
  Filled 2023-02-19: qty 15

## 2023-02-19 MED ORDER — PROPOFOL 10 MG/ML IV BOLUS
INTRAVENOUS | Status: DC | PRN
  Administered 2023-02-19: 40 mg via INTRAVENOUS

## 2023-02-19 MED ORDER — FENTANYL CITRATE (PF) 250 MCG/5ML IJ SOLN
INTRAMUSCULAR | Status: AC
  Filled 2023-02-19: qty 5

## 2023-02-19 MED ORDER — ONDANSETRON HCL 4 MG/2ML IJ SOLN
INTRAMUSCULAR | Status: DC | PRN
  Administered 2023-02-19: 4 mg via INTRAVENOUS

## 2023-02-19 MED ORDER — MIDAZOLAM HCL 2 MG/2ML IJ SOLN
INTRAMUSCULAR | Status: DC | PRN
  Administered 2023-02-19: 2 mg via INTRAVENOUS

## 2023-02-19 MED ORDER — ORAL CARE MOUTH RINSE: 15.0000 mL | Freq: Once | OROMUCOSAL | Status: AC

## 2023-02-19 MED ORDER — PROPOFOL 500 MG/50ML IV EMUL
INTRAVENOUS | Status: DC | PRN
  Administered 2023-02-19: 120 ug/kg/min via INTRAVENOUS

## 2023-02-19 MED ORDER — BUPIVACAINE HCL (PF) 0.5 % IJ SOLN
INTRAMUSCULAR | Status: DC | PRN
  Administered 2023-02-19: 10 mL

## 2023-02-19 MED ORDER — LIDOCAINE HCL 2 % IJ SOLN
INTRAMUSCULAR | Status: AC
  Filled 2023-02-19: qty 20

## 2023-02-19 MED ORDER — LIDOCAINE HCL 2 % IJ SOLN
INTRAMUSCULAR | Status: DC | PRN
  Administered 2023-02-19: 10 mL

## 2023-02-19 MED ORDER — MIDAZOLAM HCL 2 MG/2ML IJ SOLN
INTRAMUSCULAR | Status: AC
  Filled 2023-02-19: qty 2

## 2023-02-19 SURGICAL SUPPLY — 36 items
BAG COUNTER SPONGE SURGICOUNT (BAG) ×2 IMPLANT
BAG SPNG CNTER NS LX DISP (BAG) ×1
BLADE LONG MED 31X9 (MISCELLANEOUS) IMPLANT
BNDG CMPR STD VLCR NS LF 5.8X4 (GAUZE/BANDAGES/DRESSINGS) ×1
BNDG ELASTIC 4X5.8 VLCR NS LF (GAUZE/BANDAGES/DRESSINGS) IMPLANT
BNDG GAUZE DERMACEA FLUFF 4 (GAUZE/BANDAGES/DRESSINGS) IMPLANT
BNDG GZE DERMACEA 4 6PLY (GAUZE/BANDAGES/DRESSINGS) ×1
CNTNR URN SCR LID CUP LEK RST (MISCELLANEOUS) IMPLANT
CONT SPEC 4OZ STRL OR WHT (MISCELLANEOUS) ×2
COVER SURGICAL LIGHT HANDLE (MISCELLANEOUS) ×2 IMPLANT
DRSG XEROFORM 1X8 (GAUZE/BANDAGES/DRESSINGS) IMPLANT
ELECT CAUTERY BLADE 6.4 (BLADE) ×2 IMPLANT
ELECT REM PT RETURN 9FT ADLT (ELECTROSURGICAL) ×1
ELECTRODE REM PT RTRN 9FT ADLT (ELECTROSURGICAL) ×2 IMPLANT
GAUZE SPONGE 4X4 12PLY STRL (GAUZE/BANDAGES/DRESSINGS) IMPLANT
GLOVE BIO SURGEON STRL SZ7.5 (GLOVE) ×2 IMPLANT
GLOVE BIOGEL PI IND STRL 7.5 (GLOVE) ×2 IMPLANT
GOWN STRL REUS W/ TWL LRG LVL3 (GOWN DISPOSABLE) ×4 IMPLANT
GOWN STRL REUS W/TWL LRG LVL3 (GOWN DISPOSABLE) ×2
KIT BASIN OR (CUSTOM PROCEDURE TRAY) ×2 IMPLANT
KIT TURNOVER KIT B (KITS) ×2 IMPLANT
NDL HYPO 25GX1X1/2 BEV (NEEDLE) ×2 IMPLANT
NEEDLE HYPO 25GX1X1/2 BEV (NEEDLE) ×1 IMPLANT
NS IRRIG 1000ML POUR BTL (IV SOLUTION) ×2 IMPLANT
PACK ORTHO EXTREMITY (CUSTOM PROCEDURE TRAY) ×2 IMPLANT
PAD ABD 8X10 STRL (GAUZE/BANDAGES/DRESSINGS) IMPLANT
SOL PREP POV-IOD 4OZ 10% (MISCELLANEOUS) ×2 IMPLANT
STAPLER VISISTAT 35W (STAPLE) IMPLANT
SUT ETHILON 3 0 FSL (SUTURE) IMPLANT
SWAB COLLECTION DEVICE MRSA (MISCELLANEOUS) ×2 IMPLANT
SWAB CULTURE ESWAB REG 1ML (MISCELLANEOUS) ×2 IMPLANT
SYR CONTROL 10ML LL (SYRINGE) ×2 IMPLANT
TOWEL GREEN STERILE (TOWEL DISPOSABLE) ×2 IMPLANT
TOWEL GREEN STERILE FF (TOWEL DISPOSABLE) ×2 IMPLANT
TUBE CONNECTING 12X1/4 (SUCTIONS) ×2 IMPLANT
YANKAUER SUCT BULB TIP NO VENT (SUCTIONS) ×2 IMPLANT

## 2023-02-19 NOTE — Consult Note (Signed)
Subjective:  Patient ID: [name] [name], female    DOB: 06/28/1965,  MRN: [id number]  Patient with past medical history of HTN, bipolar disorder, CKD stage II, CHF, asthma, anxiety depression, GERD, pre-diabetes and s/p transmetatarsal amputation seen at beside today for infection of right foot amputation site. Patient has followed with Dr. Logan Bores and wound care for a wound on the bottom of the right foot. She was seen by PCP recently for worsening infection and MRI was taken and showed abscess and osteomyelitis and was advised to report to the ED. Patient relates no pain currently in the foot. She does relate some nausea and low grade fever.    Past Medical History:  Diagnosis Date   Acute metabolic encephalopathy 06/08/2022   AMS (altered mental status) 05/22/2022   Anxiety    Arthritis    Phreesia 02/08/2020   Asthma    mild intermittent   Asthma    Phreesia 02/08/2020   Bipolar 1 disorder ([name])    ect treatments last treatment Sep 02 1011   Depression    Depression    Phreesia 02/08/2020   Depression    Phreesia 07/10/2020   GERD (gastroesophageal reflux disease)    Headache    Hypertension    Pre-diabetes    Seizures ([name])    Thinks it was from taking Tramadol   Septic shock ([name]) 05/22/2022   Shortness of breath 09/23/2016   Overview:   exertional   Sleep apnea    AHI 40 (2024)   Substance abuse ([name])    Phreesia 02/08/2020   Syncope 08/26/2019   Syncope and collapse      Past Surgical History:  Procedure Laterality Date   ABDOMINAL HYSTERECTOMY     AMPUTATION TOE Left 02/02/2018   Procedure: AMPUTATION TOE Left 4th toe;  Surgeon: Vivi Barrack, DPM;  Location: [facility];  Service: Podiatry;  Laterality: Left;   APPENDECTOMY N/A    Phreesia 02/08/2020   BACK SURGERY  2018   ACDF   C5-6 & C6-7 by Dr. Sharolyn Douglas   CARPAL TUNNEL RELEASE     x2   I & D EXTREMITY Right 05/22/2022   Procedure: IRRIGATION AND DEBRIDEMENT EXTREMITY;  Surgeon: Candelaria Stagers, DPM;   Location: [facility];  Service: Podiatry;  Laterality: Right;   INCISION AND DRAINAGE Right 05/24/2022   Procedure: RIGHT REVISION INCISION AND DRAINAGE/ WASHOUT WITH PRIMARY DELAYED CLOSURE;  Surgeon: Edwin Cap, DPM;  Location: [facility];  Service: Podiatry;  Laterality: Right;   IRRIGATION AND DEBRIDEMENT ABSCESS Right 10/25/2020   Procedure: IRRIGATION AND DEBRIDEMENT ABSCESS OF FOOT AND APPLICATION OF GRAFT;  Surgeon: Vivi Barrack, DPM;  Location: [facility];  Service: Podiatry;  Laterality: Right;   Laproscopic knee surgery Right    NECK SURGERY  03/15/2017   ACDF   by Dr. Noel Gerold   PLANTAR FASCIA RELEASE     x2   REVERSE SHOULDER ARTHROPLASTY Right 04/14/2022   Procedure: REVERSE SHOULDER ARTHROPLASTY;  Surgeon: Bjorn Pippin, MD;  Location: [facility];  Service: Orthopedics;  Laterality: Right;   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   Procedure: TRANSMETATARSAL AMPUTATION;  Surgeon: Vivi Barrack, DPM;  Location: [facility];  Service: Podiatry;  Laterality: Right;   TRANSMETATARSAL AMPUTATION Right 05/27/2022   Procedure: REVISION TRANSMETATARSAL AMPUTATION WITH DELAYED PRIMARY CLOSURE;  Surgeon: Felecia Shelling, DPM;  Location: [facility];  Service: Podiatry;  Laterality: Right;       Latest Ref Rng & Units 02/19/2023  4:19 AM 02/18/2023    1:53 PM 12/10/2022    3:50 PM  CBC  WBC 4.0 - 10.5 K/uL 8.5  8.3  5.3   Hemoglobin 12.0 - 15.0 g/dL 8.7  9.5  78.4   Hematocrit 36.0 - 46.0 % 26.4  29.6  32.4   Platelets 150 - 400 K/uL 207  228  283        Latest Ref Rng & Units 02/19/2023    4:19 AM 02/18/2023    1:53 PM 12/10/2022    3:50 PM  BMP  Glucose 70 - 99 mg/dL 696  295  284   BUN 6 - 20 mg/dL 8  11  13    Creatinine 0.44 - 1.00 mg/dL 1.32  4.40  1.02   BUN/Creat Ratio 6 - 22 (calc)   SEE NOTE:   Sodium 135 - 145 mmol/L 133  137  139   Potassium 3.5 - 5.1 mmol/L 3.3  3.8  4.0   Chloride 98 - 111 mmol/L 104  106  104   CO2 22 - 32 mmol/L 20  20  25    Calcium 8.9 - 10.3  mg/dL 8.4  8.9  9.3      Objective:   Vitals:   02/19/23 0058 02/19/23 0612  BP: (!) 102/56 (!) 105/58  Pulse: 72 80  Resp: 18 18  Temp: 99.1 F (37.3 C) 99.9 F (37.7 C)  SpO2: 99% 95%    General:AA&O x 3. Normal mood and affect   Vascular: DP and PT pulses 2/4 bilateral. Brisk capillary refill to all digits. Pedal hair present   Neruological. Epicritic sensation grossly intact.   Derm: Plantar right foot stump wound with granular base. Erythema and edema surrounding wound area with some purulence noted. Does probe near to bone.    MSK: MMT 5/5 in dorsiflexion, plantar flexion, inversion and eversion. Normal joint ROM without pain or crepitus.       Assessment & Plan:  Patient was evaluated and treated and all questions answered.  DX: Right foot osteomyelitis and abscess Wound care: betadine , DSD  Antibiotics: Continue IV antibiotics  DME: [facility] boot.   Discussed with patient diagnosis and treatment options.  Imaging reviewed. MRI showing abscess and concern for osteomyelitis remaining in the second metatarsal and possible changes in other metatarsals Discussed need for surgical intervention to including irrigation and debridement of foot and revision of TMA site.   Patient in agreement with plan and all questions answered.  Will plan for OR later today. Patient has not had anything to eat today and will remain NPO.   Louann Sjogren, DPM  Accessible via secure chat for questions or concerns.

## 2023-02-19 NOTE — Plan of Care (Signed)
Patient alert/oriented X4. Patient compliant with medication administration and tolerated IV abx. Patient returned from R foot surgery with minimal pain. Oxycodone administered as needed. Foot is wrapped up in ace bandage + gauze wrap. R foot is elevated and patient remains on purewick. VSS.  Problem: Education: Goal: Knowledge of General Education information will improve Description: Including pain rating scale, medication(s)/side effects and non-pharmacologic comfort measures Outcome: Progressing   Problem: Health Behavior/Discharge Planning: Goal: Ability to manage health-related needs will improve Outcome: Progressing   Problem: Clinical Measurements: Goal: Ability to maintain clinical measurements within normal limits will improve Outcome: Progressing   Problem: Clinical Measurements: Goal: Will remain free from infection Outcome: Progressing   Problem: Clinical Measurements: Goal: Diagnostic test results will improve Outcome: Progressing   Problem: Clinical Measurements: Goal: Respiratory complications will improve Outcome: Progressing   Problem: Clinical Measurements: Goal: Cardiovascular complication will be avoided Outcome: Progressing   Problem: Activity: Goal: Risk for activity intolerance will decrease Outcome: Progressing   Problem: Nutrition: Goal: Adequate nutrition will be maintained Outcome: Progressing   Problem: Coping: Goal: Level of anxiety will decrease Outcome: Progressing   Problem: Elimination: Goal: Will not experience complications related to bowel motility Outcome: Progressing   Problem: Elimination: Goal: Will not experience complications related to urinary retention Outcome: Progressing   Problem: Pain Management: Goal: General experience of comfort will improve Outcome: Progressing   Problem: Safety: Goal: Ability to remain free from injury will improve Outcome: Progressing   Problem: Skin Integrity: Goal: Risk for impaired  skin integrity will decrease Outcome: Progressing

## 2023-02-19 NOTE — Interval H&P Note (Signed)
History and Physical Interval Note:  02/19/2023 10:33 AM  [name] [name]  has presented today for surgery, with the diagnosis of Right foot osteoemyelitis and abscess.  The various methods of treatment have been discussed with the patient and family. After consideration of risks, benefits and other options for treatment, the patient has consented to  Procedure(s): AMPUTATION FOOT, revision and Incision and drainage (Right) as a surgical intervention.  The patient's history has been reviewed, patient examined, no change in status, stable for surgery.  I have reviewed the patient's chart and labs.  Questions were answered to the patient's satisfaction.     Louann Sjogren

## 2023-02-19 NOTE — Anesthesia Preprocedure Evaluation (Addendum)
Anesthesia Evaluation  Patient identified by MRN, date of birth, ID band Patient awake    Reviewed: Allergy & Precautions, H&P , NPO status , Patient's Chart, lab work & pertinent test results  Airway Mallampati: II  TM Distance: >3 FB Neck ROM: Full    Dental no notable dental hx.    Pulmonary asthma , sleep apnea    Pulmonary exam normal breath sounds clear to auscultation       Cardiovascular hypertension, +CHF  Normal cardiovascular exam Rhythm:Regular Rate:Normal     Neuro/Psych  Headaches, Seizures -,  PSYCHIATRIC DISORDERS Anxiety Depression Bipolar Disorder      GI/Hepatic Neg liver ROS,GERD  ,,  Endo/Other  negative endocrine ROS    Renal/GU Renal disease  negative genitourinary   Musculoskeletal  (+) Arthritis ,    Abdominal   Peds negative pediatric ROS (+)  Hematology  (+) Blood dyscrasia, anemia   Anesthesia Other Findings   Reproductive/Obstetrics negative OB ROS                             Anesthesia Physical Anesthesia Plan  ASA: 3  Anesthesia Plan: MAC   Post-op Pain Management:    Induction: Intravenous  PONV Risk Score and Plan: Ondansetron, TIVA and Treatment may vary due to age or medical condition  Airway Management Planned: Natural Airway and Simple Face Mask  Additional Equipment:   Intra-op Plan:   Post-operative Plan: Extubation in OR  Informed Consent: I have reviewed the patients History and Physical, chart, labs and discussed the procedure including the risks, benefits and alternatives for the proposed anesthesia with the patient or authorized representative who has indicated his/her understanding and acceptance.     Dental advisory given  Plan Discussed with: CRNA  Anesthesia Plan Comments:        Anesthesia Quick Evaluation

## 2023-02-19 NOTE — Op Note (Signed)
OPERATIVE REPORT Patient name: [name] [name] MRN: [id number] DOB: 07/02/1965  DOS:  02/19/23  Preop Dx: Other osteomyelitis of right foot ([name]) Postop Dx: same  Procedure:  1. Right revision transmetatarsal amputation with irrigation and debridement   Surgeon: Louann Sjogren, DPM  Anesthesia: 50-50 mixture of 2% lidocaine plain with 0.5% Marcaine plain totaling 20cc  infiltrated in the patient's right lower extremity  Hemostasis: No TQ necessary   EBL: 20 mL Materials: n/a Injectables: as above Pathology: Right distal metatarsals of foot for pathology, culture of bone of distal metatarsal. Culture of residual second metatarsal right.   Condition: The patient tolerated the procedure and anesthesia well. No complications noted or reported   Justification for procedure: The patient is a 57 y.o. female who presents today for surgical treatment of right foot osteomyelitis and abscess.  All conservative modalities of been unsuccessful in providing any sort of satisfactory alleviation of symptoms with the patient. The patient was told benefits as well as possible side effects of the surgery. The patient consented for surgical correction. The patient consent form was reviewed. All patient questions were answered. No guarantees were expressed or implied. The patient and the surgeon both signed the patient consent form with the witness present and placed in the patient's chart.   Procedure in Detail: The patient was brought to the operating room, placed in the operating table in the supine position at which time an aseptic scrub and drape were performed about the patient's respective lower extremity after anesthesia was induced as described above. Attention was then directed to the surgical area where procedure number one commenced.  Procedure #1:  PROCEDURE IN DETAIL:  Incision was planned with skin marker. Previous incision site was then incisied with a 15 blade through skin and  subcutaneous tissue down to bone. Electrocautery was used to ligate any bleeders. Next, the bony cuts were made using sagittal saw, and these were made approximately at the mid level of the metatarsals and in a cascade fashion, transecting all of the 5 metatarsal bones. Next, the residual amputated forefoot was then removed and sent for pathology with proximal site marked. A culture of distal bone was taken as well. No major areas of purulence noted or gross necrosis of tissue. Area was debrided thoroughly.   Following this, the wound was thoroughly irrigated and then clean area of bone was biopsied for culture at the right second metatarsal. Hemostasis was then obtained After full hemostasis had been obtained, the wound was again thoroughly irrigated and then the dorsal plantar flap was further tailored so that the plantar flap was lying in a dorsal direction. The skin  was closed with interrupted sutures using 3-0 Nylon and skin staples. Plantar wound was closed as well with nylon and staples.  A Xeroform and a bulky foot dressing were applied, and the patient awakened and transferred to the recovery room in a stable condition.  Dry sterile compressive dressings were then applied to all previously mentioned incision sites about the patient's lower extremity. The patient was then transferred from the operating room to the recovery room having tolerated the procedure and anesthesia well. All vital signs are stable. After a brief stay in the recovery room the patient was readmitted to the floor.    Disposition:  Patient to be NWB and keep dressing clean dry and intact. Will follow cultures. Feel we do have surgical cure but will follow cultures and likely need two weeks oral antibiotics upon discharge.    Lurena Joiner  Ralene Cork, DPM [facility]  Dr. Louann Sjogren, DPM    [address] [place], [place] [address]                Office [phone]  Fax [phone]

## 2023-02-19 NOTE — Progress Notes (Signed)
PROGRESS NOTE    [name] [name]  ZOX:[id number] DOB: 1965-09-08 DOA: 02/18/2023 PCP: Donita Brooks, MD    Brief Narrative:   [name] [name] is a 57 y.o. female with medical history significant of HTN, bipolar disorder, OSA, CKD stage II, chronic diastolic heart failure, chronic pain, asthma, anxiety and depression, GERD, prediabetes and s/p right transmetatarsal amputation who presented to the ED due to concern for osteomyelitis of the right foot.  Patient reports that she had a an infection on her right lower extremity stump 2 months ago that progressed from an ulcer to an open draining wound.  She has been going to the wound care center for wound care.  During her last evaluation on 10/29, there was a concern for progression to deep bone infection so an MRI was obtained on 10/30.  MRI results showed signs of acute osteomyelitis and during patient's visit to her PCP today, she was advised to come to the ED for IV antibiotics and further management of her right foot infection.   OR on 11/2.   Assessment and Plan:  Osteomyelitis of the right foot Prediabetic patient with a history of peripheral neuropathy and right transmetatarsal amputation 3 years ago with recent right foot wound found to have evidence of osteomyelitis, 1.0 x 2.3 x 4.7 cm abscess and infectious myositis on MRI.  . -Podiatry consulted-- OR on 11/2 -Continue IV vancomycin and Zosyn -Follow-up blood cultures   HTN/Chronic diastolic HF No evidence of heart failure exacerbation on exam.  BP stable with SBP in the 110s to 130s. -Continue metoprolol    Chronic pain -Resume pain meds, Lyrica and muscle relaxer    GERD -Resume famotidine and Protonix   Bipolar disorder Anxiety and depression -Resume all psych meds    Class III obesity Prediabetes Body mass index is 41.02 kg/m.  Last A1c 5.4% 7 months ago. -Nutrition counseling      DVT prophylaxis: SCD's Start: 02/19/23 0949    Code Status: Full  Code   Disposition Plan:  Level of care: Med-Surg Status is: Inpatient Remains inpatient appropriate     Consultants:    Subjective: Leaving for the OR  Objective: Vitals:   02/19/23 0612 02/19/23 0752 02/19/23 1005 02/19/23 1020  BP: (!) 105/58 (!) 106/59  (!) 126/56  Pulse: 80 78  76  Resp: 18 17  (!) 23  Temp: 99.9 F (37.7 C) (!) 101.8 F (38.8 C)  99.2 F (37.3 C)  TempSrc: Oral Oral  Oral  SpO2: 95% 96%  95%  Weight:   108.4 kg   Height:   5' 4.02" (1.626 m)    No intake or output data in the 24 hours ending 02/19/23 1111 Filed Weights   02/18/23 1343 02/19/23 1005  Weight: 108.4 kg 108.4 kg    Examination:   General: Appearance:    Severely obese female in no acute distress     Lungs:      respirations unlabored  Heart:    Normal heart rate. Normal rhythm. No murmurs, rubs, or gallops.       Neurologic:   Awake, alert       Data Reviewed: I have personally reviewed following labs and imaging studies  CBC: Recent Labs  Lab 02/18/23 1353 02/19/23 0419  WBC 8.3 8.5  NEUTROABS 6.7  --   HGB 9.5* 8.7*  HCT 29.6* 26.4*  MCV 98.7 97.1  PLT 228 207   Basic Metabolic Panel: Recent Labs  Lab 02/18/23 1353 02/19/23 0419  NA 137 133*  K 3.8 3.3*  CL 106 104  CO2 20* 20*  GLUCOSE 113* 117*  BUN 11 8  CREATININE 0.84 0.93  CALCIUM 8.9 8.4*   GFR: Estimated Creatinine Clearance: 78.4 mL/min (by C-G formula based on SCr of 0.93 mg/dL). Liver Function Tests: Recent Labs  Lab 02/18/23 1353  AST 10*  ALT 10  ALKPHOS 72  BILITOT 0.3  PROT 7.0  ALBUMIN 3.2*   No results for input(s): "LIPASE", "AMYLASE" in the last 168 hours. No results for input(s): "AMMONIA" in the last 168 hours. Coagulation Profile: No results for input(s): "INR", "PROTIME" in the last 168 hours. Cardiac Enzymes: No results for input(s): "CKTOTAL", "CKMB", "CKMBINDEX", "TROPONINI" in the last 168 hours. BNP (last 3 results) No results for input(s): "PROBNP" in  the last 8760 hours. HbA1C: No results for input(s): "HGBA1C" in the last 72 hours. CBG: No results for input(s): "GLUCAP" in the last 168 hours. Lipid Profile: No results for input(s): "CHOL", "HDL", "LDLCALC", "TRIG", "CHOLHDL", "LDLDIRECT" in the last 72 hours. Thyroid Function Tests: No results for input(s): "TSH", "T4TOTAL", "FREET4", "T3FREE", "THYROIDAB" in the last 72 hours. Anemia Panel: No results for input(s): "VITAMINB12", "FOLATE", "FERRITIN", "TIBC", "IRON", "RETICCTPCT" in the last 72 hours. Sepsis Labs: Recent Labs  Lab 02/18/23 1420 02/18/23 1556  LATICACIDVEN 0.9 0.8    Recent Results (from the past 240 hour(s))  Blood culture (routine x 2)     Status: None (Preliminary result)   Collection Time: 02/18/23  3:45 PM   Specimen: BLOOD  Result Value Ref Range Status   Specimen Description BLOOD SITE NOT SPECIFIED  Final   Special Requests   Final    BOTTLES DRAWN AEROBIC AND ANAEROBIC Blood Culture results may not be optimal due to an excessive volume of blood received in culture bottles   Culture   Final    NO GROWTH < 24 HOURS Performed at [facility], [address] [place], [place] [address]    Report Status PENDING  Incomplete  Blood culture (routine x 2)     Status: None (Preliminary result)   Collection Time: 02/18/23  3:45 PM   Specimen: BLOOD  Result Value Ref Range Status   Specimen Description BLOOD SITE NOT SPECIFIED  Final   Special Requests   Final    BOTTLES DRAWN AEROBIC AND ANAEROBIC Blood Culture adequate volume   Culture   Final    NO GROWTH < 24 HOURS Performed at [facility], [address] [place], [place] [address]    Report Status PENDING  Incomplete         Radiology Studies: No results found.      Scheduled Meds:  [MAR Hold] busPIRone  15 mg Oral TID   [MAR Hold] calcium-vitamin D  1 tablet Oral Daily   Chlorhexidine Gluconate Cloth  6 each Topical Once   [MAR Hold] enoxaparin (LOVENOX) injection  50  mg Subcutaneous Q24H   [MAR Hold] escitalopram  10 mg Oral Daily   [MAR Hold] estradiol  1 mg Oral QHS   [MAR Hold] famotidine  40 mg Oral QHS   [MAR Hold] fesoterodine  4 mg Oral Daily   [MAR Hold] haloperidol  5 mg Oral QHS   [MAR Hold] influenza vac split trivalent PF  0.5 mL Intramuscular Tomorrow-1000   [MAR Hold] lamoTRIgine  100 mg Oral Daily   [MAR Hold] loratadine  10 mg Oral QPM   [MAR Hold] metoprolol succinate  25 mg  Oral Daily   [MAR Hold] multivitamin with minerals  1 tablet Oral Daily   [MAR Hold] pantoprazole  40 mg Oral BID   [MAR Hold] prazosin  2 mg Oral QHS   [MAR Hold] pregabalin  100 mg Oral TID   [MAR Hold] topiramate  50 mg Oral BID   [MAR Hold] traZODone  200 mg Oral QHS   Continuous Infusions:  [MAR Hold] piperacillin-tazobactam 3.375 g (02/19/23 0645)   [MAR Hold] vancomycin       LOS: 1 day    Time spent: 45 minutes spent on chart review, discussion with nursing staff, consultants, updating family and interview/physical exam; more than 50% of that time was spent in counseling and/or coordination of care.    Joseph Art, DO [facility] Available via Epic secure chat 7am-7pm After these hours, please refer to coverage provider listed on [email or web address] 02/19/2023, 11:11 AM

## 2023-02-19 NOTE — Anesthesia Procedure Notes (Signed)
Procedure Name: MAC Date/Time: 02/19/2023 11:26 AM  Performed by: Little Ishikawa, CRNAPre-anesthesia Checklist: Patient identified, Emergency Drugs available, Suction available, Timeout performed and Patient being monitored Patient Re-evaluated:Patient Re-evaluated prior to induction Oxygen Delivery Method: Nasal cannula Induction Type: IV induction Dental Injury: Teeth and Oropharynx as per pre-operative assessment

## 2023-02-19 NOTE — Transfer of Care (Signed)
Immediate Anesthesia Transfer of Care Note  Patient: [name] [name]  Procedure(s) Performed: AMPUTATION FOOT, revision and Incision and drainage (Right: Foot)  Patient Location: PACU  Anesthesia Type:MAC  Level of Consciousness: awake and drowsy  Airway & Oxygen Therapy: Patient Spontanous Breathing and Patient connected to nasal cannula oxygen  Post-op Assessment: Report given to RN and Post -op Vital signs reviewed and stable  Post vital signs: Reviewed and stable  Last Vitals:  Vitals Value Taken Time  BP 106/50 02/19/23 1203  Temp    Pulse 67 02/19/23 1205  Resp 14 02/19/23 1205  SpO2 96 % 02/19/23 1205  Vitals shown include unfiled device data.  Last Pain:  Vitals:   02/19/23 1020  TempSrc: Oral  PainSc:          Complications: No notable events documented.

## 2023-02-19 NOTE — H&P (View-Only) (Signed)
Subjective:  Patient ID: [name] [name], female    DOB: 06/28/1965,  MRN: [id number]  Patient with past medical history of HTN, bipolar disorder, CKD stage II, CHF, asthma, anxiety depression, GERD, pre-diabetes and s/p transmetatarsal amputation seen at beside today for infection of right foot amputation site. Patient has followed with Dr. Logan Bores and wound care for a wound on the bottom of the right foot. She was seen by PCP recently for worsening infection and MRI was taken and showed abscess and osteomyelitis and was advised to report to the ED. Patient relates no pain currently in the foot. She does relate some nausea and low grade fever.    Past Medical History:  Diagnosis Date   Acute metabolic encephalopathy 06/08/2022   AMS (altered mental status) 05/22/2022   Anxiety    Arthritis    Phreesia 02/08/2020   Asthma    mild intermittent   Asthma    Phreesia 02/08/2020   Bipolar 1 disorder ([name])    ect treatments last treatment Sep 02 1011   Depression    Depression    Phreesia 02/08/2020   Depression    Phreesia 07/10/2020   GERD (gastroesophageal reflux disease)    Headache    Hypertension    Pre-diabetes    Seizures ([name])    Thinks it was from taking Tramadol   Septic shock ([name]) 05/22/2022   Shortness of breath 09/23/2016   Overview:   exertional   Sleep apnea    AHI 40 (2024)   Substance abuse ([name])    Phreesia 02/08/2020   Syncope 08/26/2019   Syncope and collapse      Past Surgical History:  Procedure Laterality Date   ABDOMINAL HYSTERECTOMY     AMPUTATION TOE Left 02/02/2018   Procedure: AMPUTATION TOE Left 4th toe;  Surgeon: Vivi Barrack, DPM;  Location: [facility] OR;  Service: Podiatry;  Laterality: Left;   APPENDECTOMY N/A    Phreesia 02/08/2020   BACK SURGERY  2018   ACDF   C5-6 & C6-7 by Dr. Sharolyn Douglas   CARPAL TUNNEL RELEASE     x2   I & D EXTREMITY Right 05/22/2022   Procedure: IRRIGATION AND DEBRIDEMENT EXTREMITY;  Surgeon: Candelaria Stagers, DPM;   Location: [facility] ORS;  Service: Podiatry;  Laterality: Right;   INCISION AND DRAINAGE Right 05/24/2022   Procedure: RIGHT REVISION INCISION AND DRAINAGE/ WASHOUT WITH PRIMARY DELAYED CLOSURE;  Surgeon: Edwin Cap, DPM;  Location: [facility] ORS;  Service: Podiatry;  Laterality: Right;   IRRIGATION AND DEBRIDEMENT ABSCESS Right 10/25/2020   Procedure: IRRIGATION AND DEBRIDEMENT ABSCESS OF FOOT AND APPLICATION OF GRAFT;  Surgeon: Vivi Barrack, DPM;  Location: [facility] OR;  Service: Podiatry;  Laterality: Right;   Laproscopic knee surgery Right    NECK SURGERY  03/15/2017   ACDF   by Dr. Noel Gerold   PLANTAR FASCIA RELEASE     x2   REVERSE SHOULDER ARTHROPLASTY Right 04/14/2022   Procedure: REVERSE SHOULDER ARTHROPLASTY;  Surgeon: Bjorn Pippin, MD;  Location: [facility] ORS;  Service: Orthopedics;  Laterality: Right;   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   Procedure: TRANSMETATARSAL AMPUTATION;  Surgeon: Vivi Barrack, DPM;  Location: [facility] OR[facility];  Service: Podiatry;  Laterality: Right;   TRANSMETATARSAL AMPUTATION Right 05/27/2022   Procedure: REVISION TRANSMETATARSAL AMPUTATION WITH DELAYED PRIMARY CLOSURE;  Surgeon: Felecia Shelling, DPM;  Location: [facility] OR[facility];  Service: Podiatry;  Laterality: Right;       Latest Ref Rng & Units 02/19/2023  4:19 AM 02/18/2023    1:53 PM 12/10/2022    3:50 PM  CBC  WBC 4.0 - 10.5 K/uL 8.5  8.3  5.3   Hemoglobin 12.0 - 15.0 g/dL 8.7  9.5  78.4   Hematocrit 36.0 - 46.0 % 26.4  29.6  32.4   Platelets 150 - 400 K/uL 207  228  283        Latest Ref Rng & Units 02/19/2023    4:19 AM 02/18/2023    1:53 PM 12/10/2022    3:50 PM  BMP  Glucose 70 - 99 mg/dL 696  295  284   BUN 6 - 20 mg/dL 8  11  13    Creatinine 0.44 - 1.00 mg/dL 1.32  4.40  1.02   BUN/Creat Ratio 6 - 22 (calc)   SEE NOTE:   Sodium 135 - 145 mmol/L 133  137  139   Potassium 3.5 - 5.1 mmol/L 3.3  3.8  4.0   Chloride 98 - 111 mmol/L 104  106  104   CO2 22 - 32 mmol/L 20  20  25    Calcium 8.9 - 10.3  mg/dL 8.4  8.9  9.3      Objective:   Vitals:   02/19/23 0058 02/19/23 0612  BP: (!) 102/56 (!) 105/58  Pulse: 72 80  Resp: 18 18  Temp: 99.1 F (37.3 C) 99.9 F (37.7 C)  SpO2: 99% 95%    General:AA&O x 3. Normal mood and affect   Vascular: DP and PT pulses 2/4 bilateral. Brisk capillary refill to all digits. Pedal hair present   Neruological. Epicritic sensation grossly intact.   Derm: Plantar right foot stump wound with granular base. Erythema and edema surrounding wound area with some purulence noted. Does probe near to bone.    MSK: MMT 5/5 in dorsiflexion, plantar flexion, inversion and eversion. Normal joint ROM without pain or crepitus.       Assessment & Plan:  Patient was evaluated and treated and all questions answered.  DX: Right foot osteomyelitis and abscess Wound care: betadine , DSD  Antibiotics: Continue IV antibiotics  DME: CAM boot.   Discussed with patient diagnosis and treatment options.  Imaging reviewed. MRI showing abscess and concern for osteomyelitis remaining in the second metatarsal and possible changes in other metatarsals Discussed need for surgical intervention to including irrigation and debridement of foot and revision of TMA site.   Patient in agreement with plan and all questions answered.  Will plan for OR later today. Patient has not had anything to eat today and will remain NPO.   Louann Sjogren, DPM  Accessible via secure chat for questions or concerns.

## 2023-02-19 NOTE — Anesthesia Postprocedure Evaluation (Signed)
Anesthesia Post Note  Patient: [name] [name]  Procedure(s) Performed: AMPUTATION FOOT, revision and Incision and drainage (Right: Foot)     Patient location during evaluation: PACU Anesthesia Type: MAC Level of consciousness: awake and alert Pain management: pain level controlled Vital Signs Assessment: post-procedure vital signs reviewed and stable Respiratory status: spontaneous breathing, nonlabored ventilation, respiratory function stable and patient connected to nasal cannula oxygen Cardiovascular status: stable and blood pressure returned to baseline Postop Assessment: no apparent nausea or vomiting Anesthetic complications: no   No notable events documented.  Last Vitals:  Vitals:   02/19/23 1203 02/19/23 1215  BP: (!) 106/50 (!) 112/50  Pulse: 68 70  Resp: 18 16  Temp: 36.9 C 36.9 C  SpO2: 94% 95%    Last Pain:  Vitals:   02/19/23 1215  TempSrc:   PainSc: 0-No pain                 [place]

## 2023-02-19 NOTE — Brief Op Note (Signed)
02/18/2023 - 02/19/2023  10:34 AM  PATIENT:  [name] [name]  56 y.o. female  PRE-OPERATIVE DIAGNOSIS:  Right foot osteoemyelitis and abscess  POST-OPERATIVE DIAGNOSIS:  * No post-op diagnosis entered *  PROCEDURE:  Procedure(s): AMPUTATION FOOT, revision and Incision and drainage (Right)  SURGEON:  Surgeons and Role:    * Louann Sjogren, DPM - Primary  PHYSICIAN ASSISTANT:   ASSISTANTS: none   ANESTHESIA:   local and MAC  EBL:  <20 cc   BLOOD ADMINISTERED:none  DRAINS: none   LOCAL MEDICATIONS USED:  MARCAINE   , LIDOCAINE , and Amount: 20 ml  SPECIMEN:  Source of Specimen:  Distal right foot metatarsal for pathology and bone for culture. Culture of residual second metatarsal.   DISPOSITION OF SPECIMEN:   pathology and culture   COUNTS:  YES  TOURNIQUET:  * No tourniquets in log *  DICTATION: .Note written in EPIC  PLAN OF CARE: Admit to inpatient   PATIENT DISPOSITION:  PACU - hemodynamically stable.   Delay start of Pharmacological VTE agent (>24hrs) due to surgical blood loss or risk of bleeding: no

## 2023-02-20 ENCOUNTER — Encounter (HOSPITAL_COMMUNITY): Payer: Self-pay | Admitting: Podiatry

## 2023-02-20 DIAGNOSIS — M868X7 Other osteomyelitis, ankle and foot: Secondary | ICD-10-CM

## 2023-02-20 LAB — BASIC METABOLIC PANEL
Anion gap: 12 (ref 5–15)
BUN: 9 mg/dL (ref 6–20)
CO2: 19 mmol/L — ABNORMAL LOW (ref 22–32)
Calcium: 8.3 mg/dL — ABNORMAL LOW (ref 8.9–10.3)
Chloride: 107 mmol/L (ref 98–111)
Creatinine, Ser: 1.07 mg/dL — ABNORMAL HIGH (ref 0.44–1.00)
GFR, Estimated: 60 mL/min — ABNORMAL LOW (ref >60)
Glucose, Bld: 115 mg/dL — ABNORMAL HIGH (ref 70–99)
Potassium: 3.1 mmol/L — ABNORMAL LOW (ref 3.5–5.1)
Sodium: 138 mmol/L (ref 135–145)

## 2023-02-20 LAB — CBC
HCT: 23.7 % — ABNORMAL LOW (ref 36.0–46.0)
Hemoglobin: 7.7 g/dL — ABNORMAL LOW (ref 12.0–15.0)
MCH: 31.6 pg (ref 26.0–34.0)
MCHC: 32.5 g/dL (ref 30.0–36.0)
MCV: 97.1 fL (ref 80.0–100.0)
Platelets: 209 10*3/uL (ref 150–400)
RBC: 2.44 MIL/uL — ABNORMAL LOW (ref 3.87–5.11)
RDW: 12.9 % (ref 11.5–15.5)
WBC: 7.7 10*3/uL (ref 4.0–10.5)
nRBC: 0 % (ref 0.0–0.2)

## 2023-02-20 MED ORDER — POTASSIUM CHLORIDE CRYS ER 20 MEQ PO TBCR
20.0000 meq | EXTENDED_RELEASE_TABLET | Freq: Once | ORAL | Status: AC
  Administered 2023-02-20: 20 meq via ORAL
  Filled 2023-02-20: qty 1

## 2023-02-20 MED ORDER — POTASSIUM CHLORIDE 20 MEQ PO PACK
40.0000 meq | PACK | Freq: Once | ORAL | Status: AC
  Administered 2023-02-20: 40 meq via ORAL
  Filled 2023-02-20: qty 2

## 2023-02-20 NOTE — Evaluation (Signed)
Occupational Therapy Evaluation Patient Details Name: [name] [name] MRN: [id number] DOB: [birth year] Today's Date: 02/20/2023   History of Present Illness 57 yo female presents to [facility] 11/1 with R foot osteomyelitis. S/p R TMA revision with I&D on 11/2. PMH includes  GERD, hypertension, asthma, prior TMA R foot, depression, OSA, bipolar disorder, obesity.   Clinical Impression   Pt ind at baseline with ADLs with the exception of LB ADL, was using cane for mobility. Pt currently needing set up- mod A for ADLs, supervision for bed mobility and min A for transfers with RW. Pt able to hop short distances with RW, reports knee scooter she has at home fits in all rooms in her home. Pt with good adherence to WB precautions throughout session. Discussed use of shower seat for safety with bathing. Pt presenting with impairments listed below, will follow acutely. Recommend HHOT at d/c given pt has level of assist needed at home for ADLs/functional mobility, if not will need postacute rehab.       If plan is discharge home, recommend the following: A little help with walking and/or transfers;A lot of help with bathing/dressing/bathroom;Assistance with cooking/housework    Functional Status Assessment  Patient has had a recent decline in their functional status and demonstrates the ability to make significant improvements in function in a reasonable and predictable amount of time.  Equipment Recommendations  Tub/shower seat    Recommendations for Other Services PT consult     Precautions / Restrictions Precautions Precautions: Fall Restrictions Weight Bearing Restrictions: Yes RLE Weight Bearing: Non weight bearing      Mobility Bed Mobility Overal bed mobility: Needs Assistance Bed Mobility: Supine to Sit, Sit to Supine     Supine to sit: HOB elevated, Used rails, Supervision Sit to supine: Supervision   General bed mobility comments: increased time and use of rails     Transfers Overall transfer level: Needs assistance Equipment used: Rolling walker (2 wheels) Transfers: Sit to/from Stand, Bed to chair/wheelchair/BSC Sit to Stand: Min assist           General transfer comment: able to hop on LLE for short distance mobility in room      Balance Overall balance assessment: Needs assistance Sitting-balance support: No upper extremity supported, Feet supported Sitting balance-Leahy Scale: Fair     Standing balance support: Bilateral upper extremity supported, During functional activity, Reliant on assistive device for balance Standing balance-Leahy Scale: Poor                             ADL either performed or assessed with clinical judgement   ADL Overall ADL's : Needs assistance/impaired Eating/Feeding: Set up;Sitting   Grooming: Set up;Sitting   Upper Body Bathing: Minimal assistance;Sitting   Lower Body Bathing: Moderate assistance;Sitting/lateral leans   Upper Body Dressing : Sitting;Minimal assistance   Lower Body Dressing: Moderate assistance;Sitting/lateral leans   Toilet Transfer: Contact guard assist;Ambulation;BSC/3in1;Stand-pivot;Squat-pivot;Minimal assistance   Toileting- Clothing Manipulation and Hygiene: Contact guard assist       Functional mobility during ADLs: Contact guard assist;Minimal assistance;Rolling walker (2 wheels)       Vision   Vision Assessment?: No apparent visual deficits     Perception Perception: Not tested       Praxis Praxis: Not tested       Pertinent Vitals/Pain Pain Assessment Pain Assessment: Faces Pain Score: 4  Faces Pain Scale: Hurts little more Pain Location: RLE Pain Descriptors / Indicators: Guarding, Grimacing,  Discomfort Pain Intervention(s): Limited activity within patient's tolerance, Monitored during session     Extremity/Trunk Assessment Upper Extremity Assessment Upper Extremity Assessment: Generalized weakness (hx of shoulder surgery on R,  reports RTC injury on L , limited shoulder ROM but WFL for BADL/mobility tasks)   Lower Extremity Assessment Lower Extremity Assessment: Defer to PT evaluation RLE Deficits / Details: full AROM ankle DF/PF, knee flex/ext   Cervical / Trunk Assessment Cervical / Trunk Assessment: Normal   Communication Communication Communication: No apparent difficulties Cueing Techniques: Verbal cues;Gestural cues   Cognition Arousal: Alert Behavior During Therapy: WFL for tasks assessed/performed Overall Cognitive Status: Within Functional Limits for tasks assessed                                       General Comments  VSS    Exercises     Shoulder Instructions      Home Living Family/patient expects to be discharged to:: Private residence Living Arrangements: Parent;Other relatives (sister) Available Help at Discharge: Family;Available 24 hours/day Type of Home: House Home Access: Ramped entrance     Home Layout: One level     Bathroom Shower/Tub: Producer, television/film/video: Standard     Home Equipment: Agricultural consultant (2 wheels);Cane - single point;BSC/3in1   Additional Comments: knee scooter      Prior Functioning/Environment Prior Level of Function : Needs assist             Mobility Comments: pt reports using cane for gait PTA ADLs Comments: has assist for LB ADLs        OT Problem List: Decreased strength;Decreased range of motion;Decreased activity tolerance;Impaired balance (sitting and/or standing)      OT Treatment/Interventions: Self-care/ADL training;Therapeutic exercise;Energy conservation;DME and/or AE instruction;Therapeutic activities;Patient/family education;Balance training    OT Goals(Current goals can be found in the care plan section) Acute Rehab OT Goals Patient Stated Goal: none stated OT Goal Formulation: With patient Time For Goal Achievement: 03/06/23 Potential to Achieve Goals: Good ADL Goals Pt Will Perform  Lower Body Dressing: with contact guard assist;sitting/lateral leans;sit to/from stand Pt Will Transfer to Toilet: with contact guard assist;squat pivot transfer;stand pivot transfer;bedside commode Pt Will Perform Tub/Shower Transfer: Shower transfer;shower seat;Stand pivot transfer;Squat pivot transfer;with contact guard assist  OT Frequency: Min 1X/week    Co-evaluation              AM-PAC OT "6 Clicks" Daily Activity     Outcome Measure Help from another person eating meals?: A Little Help from another person taking care of personal grooming?: A Little Help from another person toileting, which includes using toliet, bedpan, or urinal?: A Little Help from another person bathing (including washing, rinsing, drying)?: A Little Help from another person to put on and taking off regular upper body clothing?: A Little Help from another person to put on and taking off regular lower body clothing?: A Little 6 Click Score: 18   End of Session Equipment Utilized During Treatment: Gait belt;Rolling walker (2 wheels) Nurse Communication: Mobility status  Activity Tolerance: Patient tolerated treatment well Patient left: in bed;with call bell/phone within reach;with bed alarm set  OT Visit Diagnosis: Unsteadiness on feet (R26.81);Other abnormalities of gait and mobility (R26.89);Muscle weakness (generalized) (M62.81)                Time: 1510-1536 OT Time Calculation (min): 26 min Charges:  OT General Charges $OT Visit:  1 Visit OT Evaluation $OT Eval Moderate Complexity: 1 Mod OT Treatments $Self Care/Home Management : 8-22 mins  Carver Fila, OTD, OTR/L SecureChat Preferred Acute Rehab [phone]   Dalphine Handing 02/20/2023, 3:57 PM

## 2023-02-20 NOTE — Evaluation (Signed)
Physical Therapy Evaluation Patient Details Name: [name] [name] MRN: [id number] DOB: 1965-07-10 Today's Date: 02/20/2023  History of Present Illness  57 yo female presents to [facility] 11/1 with R foot osteomyelitis. S/p R TMA revision with I&D on 11/2. PMH includes  GERD, hypertension, asthma, prior TMA R foot, depression, OSA, bipolar disorder, obesity.  Clinical Impression   Pt presents with generalized weakness, impaired balance, post-operative RLE pain, impaired activity tolerance. Pt to benefit from acute PT to address deficits. Pt overall requiring light PT assist for steadying throughout session, struggles most with transitions (sit<>stand, to/from knee scooter). Pt with good maintenance of NWB RLE given use of knee scooter and stand pivot transfers only. Pt states her family can physically assist as needed at d/c, requesting d/c home. PT to progress mobility as tolerated, and will continue to follow acutely.          If plan is discharge home, recommend the following: A little help with walking and/or transfers;A little help with bathing/dressing/bathroom   Can travel by private vehicle        Equipment Recommendations None recommended by PT  Recommendations for Other Services       Functional Status Assessment Patient has had a recent decline in their functional status and demonstrates the ability to make significant improvements in function in a reasonable and predictable amount of time.     Precautions / Restrictions Precautions Precautions: Fall Restrictions Weight Bearing Restrictions: Yes RLE Weight Bearing: Non weight bearing      Mobility  Bed Mobility Overal bed mobility: Needs Assistance Bed Mobility: Supine to Sit     Supine to sit: HOB elevated, Used rails, Supervision     General bed mobility comments: increased time and use of rails    Transfers Overall transfer level: Needs assistance Equipment used: Rolling walker (2 wheels) Transfers: Sit  to/from Stand, Bed to chair/wheelchair/BSC Sit to Stand: Min assist Stand pivot transfers: Min assist         General transfer comment: light rise and steady assist, Stand from EOB x2 and toilet x1. stand pivot x2, knee scooter to RW to [facility] over toilet, knee scooter to recliner.    Ambulation/Gait               General Gait Details: knee scooter: 40 ft, use of PT steadying assist transitioning to supervision and cuing for form/safety  Stairs            Wheelchair Mobility     Tilt Bed    Modified Rankin (Stroke Patients Only)       Balance Overall balance assessment: Needs assistance Sitting-balance support: No upper extremity supported, Feet supported Sitting balance-Leahy Scale: Fair     Standing balance support: Bilateral upper extremity supported, During functional activity, Reliant on assistive device for balance Standing balance-Leahy Scale: Poor                               Pertinent Vitals/Pain Pain Assessment Pain Assessment: Faces Faces Pain Scale: Hurts little more Pain Location: RLE Pain Descriptors / Indicators: Guarding, Grimacing, Discomfort Pain Intervention(s): Monitored during session, Repositioned, Limited activity within patient's tolerance    Home Living Family/patient expects to be discharged to:: Private residence Living Arrangements: Parent;Other relatives Available Help at Discharge: Family Type of Home: House Home Access: Ramped entrance       Home Layout: One level Home Equipment: Agricultural consultant (2 wheels);Cane - single point;BSC/3in1 Additional Comments:  knee scooter    Prior Function Prior Level of Function : Needs assist             Mobility Comments: pt reports using cane for gait PTA       Extremity/Trunk Assessment   Upper Extremity Assessment Upper Extremity Assessment: Defer to OT evaluation    Lower Extremity Assessment Lower Extremity Assessment: Generalized weakness;RLE  deficits/detail RLE Deficits / Details: full AROM ankle DF/PF, knee flex/ext    Cervical / Trunk Assessment Cervical / Trunk Assessment: Normal  Communication   Communication Communication: No apparent difficulties Cueing Techniques: Verbal cues;Gestural cues  Cognition Arousal: Alert Behavior During Therapy: WFL for tasks assessed/performed Overall Cognitive Status: Within Functional Limits for tasks assessed                                          General Comments      Exercises     Assessment/Plan    PT Assessment Patient needs continued PT services  PT Problem List Decreased strength;Decreased mobility;Decreased activity tolerance;Decreased balance;Decreased knowledge of use of DME;Pain       PT Treatment Interventions DME instruction;Therapeutic activities;Therapeutic exercise;Patient/family education;Balance training;Functional mobility training;Neuromuscular re-education    PT Goals (Current goals can be found in the Care Plan section)  Acute Rehab PT Goals Patient Stated Goal: home PT Goal Formulation: With patient Time For Goal Achievement: 03/06/23 Potential to Achieve Goals: Good    Frequency Min 1X/week     Co-evaluation               AM-PAC PT "6 Clicks" Mobility  Outcome Measure Help needed turning from your back to your side while in a flat bed without using bedrails?: A Little Help needed moving from lying on your back to sitting on the side of a flat bed without using bedrails?: A Little Help needed moving to and from a bed to a chair (including a wheelchair)?: A Little Help needed standing up from a chair using your arms (e.g., wheelchair or bedside chair)?: A Little Help needed to walk in hospital room?: Total Help needed climbing 3-5 steps with a railing? : Total 6 Click Score: 14    End of Session   Activity Tolerance: Patient limited by fatigue;Patient limited by pain Patient left: in chair;with call bell/phone  within reach;with chair alarm set Nurse Communication: Mobility status PT Visit Diagnosis: Other abnormalities of gait and mobility (R26.89);Muscle weakness (generalized) (M62.81)    Time: 1610-9604 PT Time Calculation (min) (ACUTE ONLY): 27 min   Charges:   PT Evaluation $PT Eval Low Complexity: 1 Low PT Treatments $Therapeutic Activity: 8-22 mins PT General Charges $$ ACUTE PT VISIT: 1 Visit         Marye Round, PT DPT Acute Rehabilitation Services Secure Chat Preferred  Office [phone]   Analicia Skibinski E Christain [place] 02/20/2023, 1:59 PM

## 2023-02-20 NOTE — Progress Notes (Signed)
PROGRESS NOTE    [name] [name]  WUJ:[id number] DOB: 1965/08/30 DOA: 02/18/2023 PCP: Donita Brooks, MD    Brief Narrative:   [name] [name] is a 57 y.o. female with medical history significant of HTN, bipolar disorder, OSA, CKD stage II, chronic diastolic heart failure, chronic pain, asthma, anxiety and depression, GERD, prediabetes and s/p right transmetatarsal amputation who presented to the ED due to concern for osteomyelitis of the right foot.  Patient reports that she had a an infection on her right lower extremity stump 2 months ago that progressed from an ulcer to an open draining wound.  She has been going to the wound care center for wound care.  During her last evaluation on 10/29, there was a concern for progression to deep bone infection so an MRI was obtained on 10/30.  MRI results showed signs of acute osteomyelitis and during patient's visit to her PCP today, she was advised to come to the ED for IV antibiotics and further management of her right foot infection.   OR on 11/2.   Assessment and Plan:  Osteomyelitis of the right foot Prediabetic patient with a history of peripheral neuropathy and right transmetatarsal amputation 3 years ago with recent right foot wound found to have evidence of osteomyelitis, 1.0 x 2.3 x 4.7 cm abscess and infectious myositis on MRI.  . -Podiatry consulted-- OR on 11/2-- seems to have gotten a surgical cure-- will likely need two weeks oral antibiotics upon discharge  -Continue IV vancomycin and Zosyn for now -Follow-up blood cultures PT eval   HTN/Chronic diastolic HF No evidence of heart failure exacerbation on exam.  BP stable with SBP in the 110s to 130s. -Continue metoprolol    Chronic pain -Resume pain meds, Lyrica and muscle relaxer    GERD -Resume famotidine and Protonix   Bipolar disorder Anxiety and depression -Resume all psych meds    Class III obesity Prediabetes Body mass index is 41.02 kg/m.  Last A1c 5.4% 7  months ago. -Nutrition counseling      DVT prophylaxis:     Code Status: Full Code   Disposition Plan:  Level of care: Med-Surg Status is: Inpatient Remains inpatient appropriate     Consultants:    Subjective: Says she can not use a walker due to shoulder pain  Objective: Vitals:   02/19/23 1953 02/20/23 0013 02/20/23 0411 02/20/23 0700  BP: (!) 97/55 (!) 104/55 (!) 101/55 136/88  Pulse: 65 66 66 95  Resp: 18 18 16 18   Temp: 99.7 F (37.6 C) 99.1 F (37.3 C) 98.9 F (37.2 C) 99.8 F (37.7 C)  TempSrc: Oral Oral Oral Oral  SpO2: 100% 97% 96% 96%  Weight:      Height:        Intake/Output Summary (Last 24 hours) at 02/20/2023 1033 Last data filed at 02/19/2023 1152 Gross per 24 hour  Intake 500 ml  Output 25 ml  Net 475 ml   Filed Weights   02/18/23 1343 02/19/23 1005  Weight: 108.4 kg 108.4 kg    Examination:   General: Appearance:    Severely obese female in no acute distress     Lungs:      respirations unlabored  Heart:    Normal heart rate. Normal rhythm. No murmurs, rubs, or gallops.       Neurologic:   Awake, alert       Data Reviewed: I have personally reviewed following labs and imaging studies  CBC: Recent Labs  Lab  02/18/23 1353 02/19/23 0419 02/20/23 0423  WBC 8.3 8.5 7.7  NEUTROABS 6.7  --   --   HGB 9.5* 8.7* 7.7*  HCT 29.6* 26.4* 23.7*  MCV 98.7 97.1 97.1  PLT 228 207 209   Basic Metabolic Panel: Recent Labs  Lab 02/18/23 1353 02/19/23 0419 02/20/23 0423  NA 137 133* 138  K 3.8 3.3* 3.1*  CL 106 104 107  CO2 20* 20* 19*  GLUCOSE 113* 117* 115*  BUN 11 8 9   CREATININE 0.84 0.93 1.07*  CALCIUM 8.9 8.4* 8.3*   GFR: Estimated Creatinine Clearance: 68.1 mL/min (A) (by C-G formula based on SCr of 1.07 mg/dL (H)). Liver Function Tests: Recent Labs  Lab 02/18/23 1353  AST 10*  ALT 10  ALKPHOS 72  BILITOT 0.3  PROT 7.0  ALBUMIN 3.2*   No results for input(s): "LIPASE", "AMYLASE" in the last 168 hours. No  results for input(s): "AMMONIA" in the last 168 hours. Coagulation Profile: No results for input(s): "INR", "PROTIME" in the last 168 hours. Cardiac Enzymes: No results for input(s): "CKTOTAL", "CKMB", "CKMBINDEX", "TROPONINI" in the last 168 hours. BNP (last 3 results) No results for input(s): "PROBNP" in the last 8760 hours. HbA1C: No results for input(s): "HGBA1C" in the last 72 hours. CBG: No results for input(s): "GLUCAP" in the last 168 hours. Lipid Profile: No results for input(s): "CHOL", "HDL", "LDLCALC", "TRIG", "CHOLHDL", "LDLDIRECT" in the last 72 hours. Thyroid Function Tests: No results for input(s): "TSH", "T4TOTAL", "FREET4", "T3FREE", "THYROIDAB" in the last 72 hours. Anemia Panel: No results for input(s): "VITAMINB12", "FOLATE", "FERRITIN", "TIBC", "IRON", "RETICCTPCT" in the last 72 hours. Sepsis Labs: Recent Labs  Lab 02/18/23 1420 02/18/23 1556  LATICACIDVEN 0.9 0.8    Recent Results (from the past 240 hour(s))  Blood culture (routine x 2)     Status: None (Preliminary result)   Collection Time: 02/18/23  3:45 PM   Specimen: BLOOD  Result Value Ref Range Status   Specimen Description BLOOD SITE NOT SPECIFIED  Final   Special Requests   Final    BOTTLES DRAWN AEROBIC AND ANAEROBIC Blood Culture results may not be optimal due to an excessive volume of blood received in culture bottles   Culture   Final    NO GROWTH 2 DAYS Performed at [facility], [address] [place], [place] [address]    Report Status PENDING  Incomplete  Blood culture (routine x 2)     Status: None (Preliminary result)   Collection Time: 02/18/23  3:45 PM   Specimen: BLOOD  Result Value Ref Range Status   Specimen Description BLOOD SITE NOT SPECIFIED  Final   Special Requests   Final    BOTTLES DRAWN AEROBIC AND ANAEROBIC Blood Culture adequate volume   Culture   Final    NO GROWTH 2 DAYS Performed at [facility], [address] [place], [place] [address]     Report Status PENDING  Incomplete  Surgical pcr screen     Status: Abnormal   Collection Time: 02/19/23 10:26 AM   Specimen: Nasal Mucosa; Nasal Swab  Result Value Ref Range Status   MRSA, PCR NEGATIVE NEGATIVE Final   Staphylococcus aureus POSITIVE (A) NEGATIVE Final    Comment: (NOTE) The Xpert SA Assay (FDA approved for NASAL specimens in patients 73 years of age and older), is one component of a comprehensive surveillance program. It is not intended to diagnose infection nor to guide or monitor treatment. Performed at [facility]  [facility], [address], [place], [place] [address]   Aerobic/Anaerobic Culture w Gram Stain (surgical/deep wound)     Status: None (Preliminary result)   Collection Time: 02/19/23 12:12 PM   Specimen: Bone; Tissue  Result Value Ref Range Status   Specimen Description BONE  Final   Special Requests INFECTED RIGHT BONE  Final   Gram Stain   Final    RARE WBC SEEN RARE GRAM NEGATIVE RODS Performed at [facility], [address], [place], [place] [address]    Culture PENDING  Incomplete   Report Status PENDING  Incomplete  Aerobic/Anaerobic Culture w Gram Stain (surgical/deep wound)     Status: None (Preliminary result)   Collection Time: 02/19/23 12:13 PM   Specimen: Bone; Tissue  Result Value Ref Range Status   Specimen Description BONE  Final   Special Requests RIGHT FOOT RESIDUAL BONE METATARSAL  Final   Gram Stain   Final    NO WBC SEEN NO ORGANISMS SEEN Performed at [facility], [address] [place], [place] [address]    Culture PENDING  Incomplete   Report Status PENDING  Incomplete         Radiology Studies: DG Foot Complete Right  Result Date: 02/19/2023 CLINICAL DATA:  [id number] Osteomyelitis of right foot [facility]) [id number] EXAM: RIGHT FOOT COMPLETE - 3+ VIEW COMPARISON:  MRI foot 02/16/2023, x-ray right foot 05/27/2022 FINDINGS: Redemonstration of prior amputation of the 5 metacarpals down to the bases. Interval  development of cortical erosion of the first metatarsal base. Vague indeterminate periosteal reaction along the metatarsal base of the second digit. No acute displaced fracture or dislocation. Posterior and plantar calcaneal spurs. Subcutaneus soft tissue edema with overlying skin staples. IMPRESSION: Interval development of cortical erosion of the first metatarsal base. Concern for acute osteomyelitis. Vague indeterminate periosteal reaction along the metatarsal base of the second digit. Consider correlation with MRI. Electronically Signed   By: Tish Frederickson M.D.   On: 02/19/2023 18:06        Scheduled Meds:  busPIRone  15 mg Oral TID   calcium-vitamin D  1 tablet Oral Daily   Chlorhexidine Gluconate Cloth  6 each Topical Q0600   enoxaparin (LOVENOX) injection  50 mg Subcutaneous Q24H   escitalopram  10 mg Oral Daily   estradiol  1 mg Oral QHS   famotidine  40 mg Oral QHS   fesoterodine  4 mg Oral Daily   haloperidol  5 mg Oral QHS   lamoTRIgine  100 mg Oral Daily   loratadine  10 mg Oral QPM   metoprolol succinate  25 mg Oral Daily   multivitamin with minerals  1 tablet Oral Daily   mupirocin ointment  1 Application Nasal BID   pantoprazole  40 mg Oral BID   prazosin  2 mg Oral QHS   pregabalin  100 mg Oral TID   topiramate  50 mg Oral BID   traZODone  200 mg Oral QHS   Continuous Infusions:  piperacillin-tazobactam 3.375 g (02/20/23 0603)   vancomycin 1,250 mg (02/19/23 1733)     LOS: 2 days    Time spent: 45 minutes spent on chart review, discussion with nursing staff, consultants, updating family and interview/physical exam; more than 50% of that time was spent in counseling and/or coordination of care.    Joseph Art, DO [facility] Available via Epic secure chat 7am-7pm After these hours, please refer to coverage provider listed on [email or web address] 02/20/2023, 10:33  AM

## 2023-02-20 NOTE — Plan of Care (Signed)
Patient alert/oriented X4. Patient compliant with medication administration and was up in the chair for a few hours. Patient ambulated around room as a standby assist while using a walker. Patient administered oxycodone as needed for pain prior to working with occupational therapy. Patient tolerated IV abx, VSS. Will continue to monitor. Dressing intact on R foot.  Problem: Education: Goal: Knowledge of General Education information will improve Description: Including pain rating scale, medication(s)/side effects and non-pharmacologic comfort measures Outcome: Progressing   Problem: Health Behavior/Discharge Planning: Goal: Ability to manage health-related needs will improve Outcome: Progressing   Problem: Clinical Measurements: Goal: Ability to maintain clinical measurements within normal limits will improve Outcome: Progressing   Problem: Clinical Measurements: Goal: Will remain free from infection Outcome: Progressing   Problem: Clinical Measurements: Goal: Diagnostic test results will improve Outcome: Progressing   Problem: Clinical Measurements: Goal: Respiratory complications will improve Outcome: Progressing   Problem: Clinical Measurements: Goal: Cardiovascular complication will be avoided Outcome: Progressing   Problem: Activity: Goal: Risk for activity intolerance will decrease Outcome: Progressing   Problem: Nutrition: Goal: Adequate nutrition will be maintained Outcome: Progressing   Problem: Coping: Goal: Level of anxiety will decrease Outcome: Progressing   Problem: Elimination: Goal: Will not experience complications related to bowel motility Outcome: Progressing   Problem: Elimination: Goal: Will not experience complications related to urinary retention Outcome: Progressing   Problem: Pain Management: Goal: General experience of comfort will improve Outcome: Progressing   Problem: Safety: Goal: Ability to remain free from injury will  improve Outcome: Progressing   Problem: Skin Integrity: Goal: Risk for impaired skin integrity will decrease Outcome: Progressing

## 2023-02-20 NOTE — Progress Notes (Signed)
Subjective:  Patient ID: [name] [name], female    DOB: [birth year],  MRN: [id number]  Patient seen at bedside this morning s/p revision TMA on the right foot. Relates doing well. Denies n/v/f/c.   Past Medical History:  Diagnosis Date   Acute metabolic encephalopathy 06/08/2022   AMS (altered mental status) 05/22/2022   Anxiety    Arthritis    Phreesia 02/08/2020   Asthma    mild intermittent   Asthma    Phreesia 02/08/2020   Bipolar 1 disorder ([name])    ect treatments last treatment Sep 02 1011   Depression    Depression    Phreesia 02/08/2020   Depression    Phreesia 07/10/2020   GERD (gastroesophageal reflux disease)    Headache    Hypertension    Pre-diabetes    Seizures ([name])    Thinks it was from taking Tramadol   Septic shock ([name]) 05/22/2022   Shortness of breath 09/23/2016   Overview:   exertional   Sleep apnea    AHI 40 (2024)   Substance abuse ([name])    Phreesia 02/08/2020   Syncope 08/26/2019   Syncope and collapse      Past Surgical History:  Procedure Laterality Date   ABDOMINAL HYSTERECTOMY     AMPUTATION Right 02/19/2023   Procedure: AMPUTATION FOOT, revision and Incision and drainage;  Surgeon: Louann Sjogren, DPM;  Location: [facility];  Service: Orthopedics/Podiatry;  Laterality: Right;   AMPUTATION TOE Left 02/02/2018   Procedure: AMPUTATION TOE Left 4th toe;  Surgeon: Vivi Barrack, DPM;  Location: [facility] OR;  Service: Podiatry;  Laterality: Left;   APPENDECTOMY N/A    Phreesia 02/08/2020   BACK SURGERY  2018   ACDF   C5-6 & C6-7 by Dr. Sharolyn Douglas   CARPAL TUNNEL RELEASE     x2   I & D EXTREMITY Right 05/22/2022   Procedure: IRRIGATION AND DEBRIDEMENT EXTREMITY;  Surgeon: Candelaria Stagers, DPM;  Location: [facility] ORS;  Service: Podiatry;  Laterality: Right;   INCISION AND DRAINAGE Right 05/24/2022   Procedure: RIGHT REVISION INCISION AND DRAINAGE/ WASHOUT WITH PRIMARY DELAYED CLOSURE;  Surgeon: Edwin Cap, DPM;  Location: [facility] ORS;  Service:  Podiatry;  Laterality: Right;   IRRIGATION AND DEBRIDEMENT ABSCESS Right 10/25/2020   Procedure: IRRIGATION AND DEBRIDEMENT ABSCESS OF FOOT AND APPLICATION OF GRAFT;  Surgeon: Vivi Barrack, DPM;  Location: [facility] OR;  Service: Podiatry;  Laterality: Right;   Laproscopic knee surgery Right    NECK SURGERY  03/15/2017   ACDF   by Dr. Noel Gerold   PLANTAR FASCIA RELEASE     x2   REVERSE SHOULDER ARTHROPLASTY Right 04/14/2022   Procedure: REVERSE SHOULDER ARTHROPLASTY;  Surgeon: Bjorn Pippin, MD;  Location: [facility];  Service: Orthopedics;  Laterality: Right;   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   Procedure: TRANSMETATARSAL AMPUTATION;  Surgeon: Vivi Barrack, DPM;  Location: [facility];  Service: Podiatry;  Laterality: Right;   TRANSMETATARSAL AMPUTATION Right 05/27/2022   Procedure: REVISION TRANSMETATARSAL AMPUTATION WITH DELAYED PRIMARY CLOSURE;  Surgeon: Felecia Shelling, DPM;  Location: [facility] OR[facility];  Service: Podiatry;  Laterality: Right;       Latest Ref Rng & Units 02/20/2023    4:23 AM 02/19/2023    4:19 AM 02/18/2023    1:53 PM  CBC  WBC 4.0 - 10.5 K/uL 7.7  8.5  8.3   Hemoglobin 12.0 - 15.0 g/dL 7.7  8.7  9.5   Hematocrit 36.0 - 46.0 % 23.7  26.4  29.6   Platelets 150 - 400 K/uL 209  207  228        Latest Ref Rng & Units 02/20/2023    4:23 AM 02/19/2023    4:19 AM 02/18/2023    1:53 PM  BMP  Glucose 70 - 99 mg/dL 132  440  102   BUN 6 - 20 mg/dL 9  8  11    Creatinine 0.44 - 1.00 mg/dL 7.25  3.66  4.40   Sodium 135 - 145 mmol/L 138  133  137   Potassium 3.5 - 5.1 mmol/L 3.1  3.3  3.8   Chloride 98 - 111 mmol/L 107  104  106   CO2 22 - 32 mmol/L 19  20  20    Calcium 8.9 - 10.3 mg/dL 8.3  8.4  8.9      Objective:   Vitals:   02/20/23 0411 02/20/23 0700  BP: (!) 101/55 136/88  Pulse: 66 95  Resp: 16 18  Temp: 98.9 F (37.2 C) 99.8 F (37.7 C)  SpO2: 96% 96%    General:AA&O x 3. Normal mood and affect   Vascular: DP and PT pulses 2/4 bilateral. Brisk capillary  refill to all digits. Pedal hair present   Neruological. Epicritic sensation grossly intact.   Derm: Dressing clean dry and intact.   MSK: MMT 5/5 in dorsiflexion, plantar flexion, inversion and eversion. Normal joint ROM without pain or crepitus.      Assessment & Plan:  Patient was evaluated and treated and all questions answered.  DX: s/p right revision transmetatarsal amputation  Patient to be NWB and keep dressing clean dry and intact.  Residual bone cultures no organisms seen. Pending final result.  Feel we were able to get surgical cure and patient will likely need two weeks oral antibiotics upon discharge Patient is ok for discharge from podiatry standpoint and will follow with [place] in clinic in about 1 week.  Louann Sjogren, DPM  Accessible via secure chat for questions or concerns.

## 2023-02-21 ENCOUNTER — Telehealth: Payer: Self-pay | Admitting: Gastroenterology

## 2023-02-21 DIAGNOSIS — M868X7 Other osteomyelitis, ankle and foot: Secondary | ICD-10-CM | POA: Diagnosis not present

## 2023-02-21 LAB — CBC
HCT: 24.1 % — ABNORMAL LOW (ref 36.0–46.0)
Hemoglobin: 7.8 g/dL — ABNORMAL LOW (ref 12.0–15.0)
MCH: 31.3 pg (ref 26.0–34.0)
MCHC: 32.4 g/dL (ref 30.0–36.0)
MCV: 96.8 fL (ref 80.0–100.0)
Platelets: 235 10*3/uL (ref 150–400)
RBC: 2.49 MIL/uL — ABNORMAL LOW (ref 3.87–5.11)
RDW: 12.7 % (ref 11.5–15.5)
WBC: 6.2 10*3/uL (ref 4.0–10.5)
nRBC: 0 % (ref 0.0–0.2)

## 2023-02-21 LAB — BASIC METABOLIC PANEL
Anion gap: 8 (ref 5–15)
BUN: 8 mg/dL (ref 6–20)
CO2: 21 mmol/L — ABNORMAL LOW (ref 22–32)
Calcium: 8.6 mg/dL — ABNORMAL LOW (ref 8.9–10.3)
Chloride: 108 mmol/L (ref 98–111)
Creatinine, Ser: 1.14 mg/dL — ABNORMAL HIGH (ref 0.44–1.00)
GFR, Estimated: 55 mL/min — ABNORMAL LOW (ref >60)
Glucose, Bld: 115 mg/dL — ABNORMAL HIGH (ref 70–99)
Potassium: 3.6 mmol/L (ref 3.5–5.1)
Sodium: 137 mmol/L (ref 135–145)

## 2023-02-21 MED ORDER — SULFAMETHOXAZOLE-TRIMETHOPRIM 800-160 MG PO TABS
1.0000 | ORAL_TABLET | Freq: Two times a day (BID) | ORAL | Status: DC
  Administered 2023-02-21 – 2023-02-24 (×7): 1 via ORAL
  Filled 2023-02-21 (×8): qty 1

## 2023-02-21 NOTE — Progress Notes (Signed)
RE: [name] [name]. [place] Date of Birth: 1965/07/12 Date: 02/21/2023  Please be advised that the above-named patient will require a short-term nursing home stay - anticipated 30 days or less for rehabilitation and strengthening.  The plan is for return home.

## 2023-02-21 NOTE — TOC Initial Note (Addendum)
Transition of Care [facility]) - Initial/Assessment Note    Patient Details  Name: [name] [name] MRN: [id number] Date of Birth: 1965-08-23  [facility] CM/SW Contact:    Marliss Coots, LCSW Phone Number: 02/21/2023, 11:57 AM  Clinical Narrative:                  This CSW introduced themself and their role to patient at bedside. CSW discussed discharge plans with [name] [name], who stated that they would prefer to be discharged home with OPPT/OPOT from past [facility] Health agency, [facility], but would accept SNF admission based on medical team preference. CSW informed [name] [name] that they would proceed to complete the SNF referral process for facilities in patient preferred county [facility]) and would follow up with options. [name] [name] displayed understanding of the following information.  Patient confirmed residence and contact information to this CSW. [name] [name] stated that she resides with her mother and sister, but other family (aunt, adult siblings) reside in [place], as well. Patient's aunt is able to provide transportation upon discharge.  Expected Discharge Plan: Skilled Nursing Facility Barriers to Discharge: Insurance Authorization, SNF Pending bed offer, SNF Pending discharge orders, Continued Medical Work up   Patient Goals and CMS Choice   CMS Medicare.gov Compare Post Acute Care list provided to:: Patient Choice offered to / list presented to : Patient      Expected Discharge Plan and Services In-house Referral: Clinical Social Work   Post Acute Care Choice: Skilled Nursing Facility Living arrangements for the past 2 months: Single Family Home                                      Prior Living Arrangements/Services Living arrangements for the past 2 months: Single Family Home Lives with:: Relatives, Siblings Patient language and need for interpreter reviewed:: Yes Do you feel safe going back to the place where you live?: Yes      Need for Family Participation  in Patient Care: Yes (Comment) Care giver support system in place?: Yes (comment)   Criminal Activity/Legal Involvement Pertinent to Current Situation/Hospitalization: No - Comment as needed  Activities of Daily Living   ADL Screening (condition at time of admission) Independently performs ADLs?: No Does the patient have a NEW difficulty with bathing/dressing/toileting/self-feeding that is expected to last >3 days?: Yes (Initiates electronic notice to provider for possible OT consult) (needs help) Does the patient have a NEW difficulty with getting in/out of bed, walking, or climbing stairs that is expected to last >3 days?: Yes (Initiates electronic notice to provider for possible PT consult) (needs help) Does the patient have a NEW difficulty with communication that is expected to last >3 days?: No Is the patient deaf or have difficulty hearing?: No Does the patient have difficulty seeing, even when wearing glasses/contacts?: Yes Does the patient have difficulty concentrating, remembering, or making decisions?: Yes  Permission Sought/Granted Permission sought to share information with : Facility Industrial/product designer granted to share information with : Yes, Verbal Permission Granted  Share Information with NAME: [name] [name]  Permission granted to share info w AGENCY: SNF  Permission granted to share info w Relationship: Mother  Permission granted to share info w Contact Information: [phone]  Emotional Assessment Appearance:: Developmentally appropriate, Appears stated age Attitude/Demeanor/Rapport: Engaged Affect (typically observed): Accepting, Appropriate, Calm, Stable Orientation: : Oriented to Self, Oriented to Place, Oriented to  Time, Oriented to Situation  Alcohol / Substance Use: Not Applicable Psych Involvement: No (comment)  Admission diagnosis:  Osteomyelitis of right foot ([name]) [M86.9] Other osteomyelitis of right foot ([name]) [M86.8X7] Patient Active  Problem List   Diagnosis Date Noted   Osteomyelitis of right foot ([name]) 02/18/2023   Low back pain, unspecified 11/19/2022   Acute kidney injury superimposed on CKD ([name]) 07/20/2022   Pain of left sacroiliac joint 07/19/2022   Acute cystitis with hematuria 07/19/2022   Hypotension 07/19/2022   Chronic kidney disease, stage III (moderate) ([name]) 07/19/2022   Anemia of chronic disease 07/19/2022   Acute encephalopathy 07/06/2022   Chronic diastolic CHF (congestive heart failure) ([name]) 07/06/2022   Bilateral leg edema 06/27/2022   Right foot infection 05/27/2022   Morbid obesity ([name]) 05/26/2022   Cellulitis of right foot 05/22/2022   Sepsis secondary to UTI ([name]) 05/22/2022   Abscess of right foot 05/22/2022   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   Diarrhea in adult patient    Weakness 06/30/2021   Gastroenteritis, infectious 06/30/2021   Hyponatremia 06/30/2021   Open wound of right foot, initial encounter 10/21/2020   Finger ulcer ([name]) 10/21/2020   Non-healing open wound of toe 05/15/2019   Osteomyelitis ([name]) 05/15/2019   Palpitations 10/26/2018   Foot ulcer ([name]) 01/31/2018   Hyperglycemia 01/31/2018   Left leg cellulitis 09/24/2017   Near syncope 11/23/2016   Anxiety 09/23/2016   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[name]) 09/23/2016   Constipation 09/23/2016   DDD (degenerative disc disease), lumbosacral 09/23/2016   Migraines 09/23/2016   Myelopathy ([name]) 09/23/2016   Post laminectomy syndrome 09/23/2016   Pseudoarthrosis of lumbar spine 09/23/2016   Sleep apnea 09/23/2016   Spinal stenosis of lumbar region 09/23/2016   Vertigo 09/23/2016   Hallux malleus 11/15/2015   Chronic pain syndrome    GERD (gastroesophageal reflux disease)    Depression    Rash 11/14/2015   Nonspecific chest pain    Obesity (BMI 30-39.9) 10/26/2014   Hypokalemia 10/26/2014   Manic bipolar I disorder ([name]) 10/26/2014   Tardive dyskinesia 08/26/2011   Manic  bipolar I disorder with rapid cycling ([name]) 05/18/2011    Class: Acute   Asthma 02/24/2011   History of migraine headaches 02/24/2011   PCP:  Donita Brooks, MD Pharmacy:   [id number] [place], [place] - [address] [place] [address] Phone: [phone] Fax: [phone]     Social Determinants of Health (SDOH) Social History: SDOH Screenings   Food Insecurity: No Food Insecurity (02/18/2023)  Housing: Low Risk  (02/18/2023)  Transportation Needs: No Transportation Needs (02/18/2023)  Utilities: Not At Risk (02/18/2023)  Alcohol Screen: Low Risk  (08/21/2020)  Depression (PHQ2-9): High Risk (02/18/2023)  Financial Resource Strain: Low Risk  (07/27/2022)  Physical Activity: Inactive (07/26/2022)  Social Connections: Unknown (08/30/2021)   Received from [facility]  Stress: No Stress Concern Present (11/19/2022)  Tobacco Use: Low Risk  (02/19/2023)  Health Literacy: Adequate Health Literacy (11/19/2022)   SDOH Interventions:     Readmission Risk Interventions    07/21/2022    2:43 PM 06/25/2022    1:44 PM  Readmission Risk Prevention Plan  Transportation Screening Complete Complete  PCP or Specialist Appt within 3-5 Days  Complete  HRI or Home Care Consult  Complete  Social Work Consult for Recovery Care Planning/Counseling  --  Palliative Care Screening  Not Applicable  Medication Review Oceanographer) Complete  Complete  HRI or Home Care Consult Complete   SW Recovery Care/Counseling Consult Complete   Palliative Care Screening Not Applicable   Skilled Nursing Facility Not Applicable

## 2023-02-21 NOTE — Progress Notes (Signed)
Physical Therapy Treatment Patient Details Name: [name] [name] MRN: [id number] DOB: [birth year] Today's Date: 02/21/2023   History of Present Illness 57 yo female presents to [facility] 11[facility]1 with R foot osteomyelitis. S/p R TMA revision with I&D on 11/2. PMH includes  GERD, hypertension, asthma, prior TMA R foot, depression, OSA, bipolar disorder, obesity.    PT Comments  Continuing work on functional mobility and activity tolerance;  Session focused on transfer training and mobility/amb with use of knee scooter; Pt has used a knee scooter in the past at home, and shows good stability (adjusted seat of scooter lower for better fit); Knee and hip pain on the R with use of the scooter -- pt attributes to OA; Due to pt current functional status, PLOF, home set up and available assistance at home recommending skilled physical therapy services< 3 hours/day on discharge from acute care hospital setting in order to decrease risk for falls, injury, immobility, skin break down and re-hospitalization.      If plan is discharge home, recommend the following: A little help with walking and/or transfers;A little help with bathing/dressing/bathroom   Can travel by private vehicle     No  Equipment Recommendations  None recommended by PT    Recommendations for Other Services       Precautions / Restrictions Precautions Precautions: Fall Restrictions RLE Weight Bearing: Non weight bearing     Mobility  Bed Mobility Overal bed mobility: Needs Assistance Bed Mobility: Supine to Sit     Supine to sit: HOB elevated, Used rails, Supervision     General bed mobility comments: increased time and use of rails    Transfers Overall transfer level: Needs assistance Equipment used: Rolling walker (2 wheels) (and knee scooter) Transfers: Sit to/from Stand, Bed to chair/wheelchair/BSC Sit to Stand: Min assist Stand pivot transfers: Min assist         General transfer comment: stood from bed using RW  to steady, and then able to place R knee on knee scooter; Used athletic shoe L foot, which made pivoting from standing with knee scooter to teh recliner more difficult    Ambulation/Gait               General Gait Details: knee scooter: 80 ft, use of PT steadying assist transitioning to supervision and cuing for form/safety   Stairs             Wheelchair Mobility     Tilt Bed    Modified Rankin (Stroke Patients Only)       Balance     Sitting balance-Leahy Scale: Fair       Standing balance-Leahy Scale: Poor                              Cognition Arousal: Alert Behavior During Therapy: WFL for tasks assessed/performed Overall Cognitive Status: Within Functional Limits for tasks assessed                                          Exercises      General Comments        Pertinent Vitals/Pain Pain Assessment Pain Assessment: 0-10 Pain Score: 7  Pain Location: R knee and hip; pt attributes to OA pain Pain Descriptors / Indicators: Guarding, Grimacing, Discomfort Pain Intervention(s): Monitored during session    Home Living  Prior Function            PT Goals (current goals can now be found in the care plan section) Acute Rehab PT Goals Patient Stated Goal: home PT Goal Formulation: With patient Time For Goal Achievement: 03/06/23 Potential to Achieve Goals: Good Progress towards PT goals: Progressing toward goals    Frequency    Min 1X/week      PT Plan      Co-evaluation              AM-PAC PT "6 Clicks" Mobility   Outcome Measure  Help needed turning from your back to your side while in a flat bed without using bedrails?: None Help needed moving from lying on your back to sitting on the side of a flat bed without using bedrails?: A Little Help needed moving to and from a bed to a chair (including a wheelchair)?: A Little Help needed standing up from a chair  using your arms (e.g., wheelchair or bedside chair)?: A Little Help needed to walk in hospital room?: Total Help needed climbing 3-5 steps with a railing? : Total 6 Click Score: 15    End of Session Equipment Utilized During Treatment: Gait belt Activity Tolerance: Patient tolerated treatment well Patient left: in chair;with call bell/phone within reach;with chair alarm set Nurse Communication: Mobility status PT Visit Diagnosis: Other abnormalities of gait and mobility (R26.89);Muscle weakness (generalized) (M62.81)     Time: 1220-1250 PT Time Calculation (min) (ACUTE ONLY): 30 min  Charges:    $Gait Training: 8-22 mins $Therapeutic Activity: 8-22 mins PT General Charges $$ ACUTE PT VISIT: 1 Visit                     Van Clines, PT  Acute Rehabilitation Services Office [phone] Secure Chat welcomed    Levi Aland 02/21/2023, 4:02 PM

## 2023-02-21 NOTE — Plan of Care (Signed)

## 2023-02-21 NOTE — Telephone Encounter (Signed)
Patient called and left a voicemail wanting to cancel her procedure on 02/23/23. I called patient back to let her know we received her message, and her message was sent to the nurse. She said that she will call back to reschedule.

## 2023-02-21 NOTE — Telephone Encounter (Signed)
Called endo and procedure cancelled. °

## 2023-02-21 NOTE — Progress Notes (Signed)
PROGRESS NOTE    [name] [name]  WUJ:[id number] DOB: 03/11/1966 DOA: 02/18/2023 PCP: Donita Brooks, MD    Brief Narrative:   [name] [name] is a 58 y.o. female with medical history significant of HTN, bipolar disorder, OSA, CKD stage II, chronic diastolic heart failure, chronic pain, asthma, anxiety and depression, GERD, prediabetes and s/p right transmetatarsal amputation who presented to the ED due to concern for osteomyelitis of the right foot.  Patient reports that she had a an infection on her right lower extremity stump 2 months ago that progressed from an ulcer to an open draining wound.  She has been going to the wound care center for wound care.  During her last evaluation on 10/29, there was a concern for progression to deep bone infection so an MRI was obtained on 10/30.  MRI results showed signs of acute osteomyelitis and during patient's visit to her PCP today, she was advised to come to the ED for IV antibiotics and further management of her right foot infection.   OR on 11/2.   Assessment and Plan:  Osteomyelitis of the right foot Prediabetic patient with a history of peripheral neuropathy and right transmetatarsal amputation 3 years ago with recent right foot wound found to have evidence of osteomyelitis, 1.0 x 2.3 x 4.7 cm abscess and infectious myositis on MRI.  . -Podiatry consulted-- OR on 11/2-- seems to have gotten a surgical cure-- will likely need two weeks oral antibiotics upon discharge  - IV vancomycin and Zosyn - change to PO abx per pharmacy recs and finish course PT eval- SNF   HTN/Chronic diastolic HF No evidence of heart failure exacerbation on exam.  BP stable with SBP in the 110s to 130s. -Continue metoprolol    Chronic pain -Resume pain meds, Lyrica and muscle relaxer    GERD -Resume famotidine and Protonix   Bipolar disorder Anxiety and depression -Resume all psych meds    Class III obesity Prediabetes Body mass index is 41.02 kg/m.   Last A1c 5.4% 7 months ago. -Nutrition counseling      DVT prophylaxis:     Code Status: Full Code   Disposition Plan:  Level of care: Med-Surg Status is: Inpatient Remains inpatient appropriate     Consultants:  podiatry  Subjective: Agreeable for SNF  Objective: Vitals:   02/20/23 2203 02/21/23 0402 02/21/23 0730 02/21/23 0949  BP: (!) 107/59 111/70 118/69   Pulse: 69 60 (!) 58 62  Resp: 18 18 18    Temp: 98.4 F (36.9 C) 98.2 F (36.8 C) 98 F (36.7 C)   TempSrc: Oral Oral Oral   SpO2: 100% 99% 99%   Weight:      Height:        Intake/Output Summary (Last 24 hours) at 02/21/2023 1205 Last data filed at 02/21/2023 1137 Gross per 24 hour  Intake --  Output 925 ml  Net -925 ml   Filed Weights   02/18/23 1343 02/19/23 1005  Weight: 108.4 kg 108.4 kg    Examination:   General: Appearance:    Severely obese female in no acute distress     Lungs:     respirations unlabored  Heart:    Normal heart rate.     Neurologic:   Awake, alert       Data Reviewed: I have personally reviewed following labs and imaging studies  CBC: Recent Labs  Lab 02/18/23 1353 02/19/23 0419 02/20/23 0423 02/21/23 0709  WBC 8.3 8.5 7.7 6.2  NEUTROABS  6.7  --   --   --   HGB 9.5* 8.7* 7.7* 7.8*  HCT 29.6* 26.4* 23.7* 24.1*  MCV 98.7 97.1 97.1 96.8  PLT 228 207 209 235   Basic Metabolic Panel: Recent Labs  Lab 02/18/23 1353 02/19/23 0419 02/20/23 0423 02/21/23 0709  NA 137 133* 138 137  K 3.8 3.3* 3.1* 3.6  CL 106 104 107 108  CO2 20* 20* 19* 21*  GLUCOSE 113* 117* 115* 115*  BUN 11 8 9 8   CREATININE 0.84 0.93 1.07* 1.14*  CALCIUM 8.9 8.4* 8.3* 8.6*   GFR: Estimated Creatinine Clearance: 63.9 mL/min (A) (by C-G formula based on SCr of 1.14 mg/dL (H)). Liver Function Tests: Recent Labs  Lab 02/18/23 1353  AST 10*  ALT 10  ALKPHOS 72  BILITOT 0.3  PROT 7.0  ALBUMIN 3.2*   No results for input(s): "LIPASE", "AMYLASE" in the last 168 hours. No  results for input(s): "AMMONIA" in the last 168 hours. Coagulation Profile: No results for input(s): "INR", "PROTIME" in the last 168 hours. Cardiac Enzymes: No results for input(s): "CKTOTAL", "CKMB", "CKMBINDEX", "TROPONINI" in the last 168 hours. BNP (last 3 results) No results for input(s): "PROBNP" in the last 8760 hours. HbA1C: No results for input(s): "HGBA1C" in the last 72 hours. CBG: No results for input(s): "GLUCAP" in the last 168 hours. Lipid Profile: No results for input(s): "CHOL", "HDL", "LDLCALC", "TRIG", "CHOLHDL", "LDLDIRECT" in the last 72 hours. Thyroid Function Tests: No results for input(s): "TSH", "T4TOTAL", "FREET4", "T3FREE", "THYROIDAB" in the last 72 hours. Anemia Panel: No results for input(s): "VITAMINB12", "FOLATE", "FERRITIN", "TIBC", "IRON", "RETICCTPCT" in the last 72 hours. Sepsis Labs: Recent Labs  Lab 02/18/23 1420 02/18/23 1556  LATICACIDVEN 0.9 0.8    Recent Results (from the past 240 hour(s))  Blood culture (routine x 2)     Status: None (Preliminary result)   Collection Time: 02/18/23  3:45 PM   Specimen: BLOOD  Result Value Ref Range Status   Specimen Description BLOOD SITE NOT SPECIFIED  Final   Special Requests   Final    BOTTLES DRAWN AEROBIC AND ANAEROBIC Blood Culture results may not be optimal due to an excessive volume of blood received in culture bottles   Culture   Final    NO GROWTH 2 DAYS Performed at [facility], [address], [place], [place] [address]    Report Status PENDING  Incomplete  Blood culture (routine x 2)     Status: None (Preliminary result)   Collection Time: 02/18/23  3:45 PM   Specimen: BLOOD  Result Value Ref Range Status   Specimen Description BLOOD SITE NOT SPECIFIED  Final   Special Requests   Final    BOTTLES DRAWN AEROBIC AND ANAEROBIC Blood Culture adequate volume   Culture   Final    NO GROWTH 2 DAYS Performed at [facility], [address], [place], [place] [address]     Report Status PENDING  Incomplete  Surgical pcr screen     Status: Abnormal   Collection Time: 02/19/23 10:26 AM   Specimen: Nasal Mucosa; Nasal Swab  Result Value Ref Range Status   MRSA, PCR NEGATIVE NEGATIVE Final   Staphylococcus aureus POSITIVE (A) NEGATIVE Final    Comment: (NOTE) The Xpert SA Assay (FDA approved for NASAL specimens in patients 71 years of age and older), is one component of a comprehensive surveillance program. It is not intended to diagnose infection nor to guide or monitor  treatment. Performed at [facility], [address] [place], [place] [address]   Aerobic/Anaerobic Culture w Gram Stain (surgical/deep wound)     Status: None (Preliminary result)   Collection Time: 02/19/23 12:12 PM   Specimen: Bone; Tissue  Result Value Ref Range Status   Specimen Description BONE  Final   Special Requests INFECTED RIGHT BONE  Final   Gram Stain RARE WBC SEEN RARE GRAM NEGATIVE RODS   Final   Culture   Final    NO GROWTH 2 DAYS NO ANAEROBES ISOLATED; CULTURE IN PROGRESS FOR 5 DAYS Performed at [facility], [address] [place], [place] [address]    Report Status PENDING  Incomplete  Aerobic/Anaerobic Culture w Gram Stain (surgical/deep wound)     Status: None (Preliminary result)   Collection Time: 02/19/23 12:13 PM   Specimen: Bone; Tissue  Result Value Ref Range Status   Specimen Description BONE  Final   Special Requests RIGHT FOOT RESIDUAL BONE METATARSAL  Final   Gram Stain NO WBC SEEN NO ORGANISMS SEEN   Final   Culture   Final    NO GROWTH 2 DAYS NO ANAEROBES ISOLATED; CULTURE IN PROGRESS FOR 5 DAYS Performed at [facility], [address] [place], [place] [address]    Report Status PENDING  Incomplete         Radiology Studies: DG Foot Complete Right  Result Date: 02/19/2023 CLINICAL DATA:  [id number] Osteomyelitis of right foot [facility]) [id number] EXAM: RIGHT FOOT COMPLETE - 3+ VIEW COMPARISON:  MRI foot 02/16/2023, x-ray right  foot 05/27/2022 FINDINGS: Redemonstration of prior amputation of the 5 metacarpals down to the bases. Interval development of cortical erosion of the first metatarsal base. Vague indeterminate periosteal reaction along the metatarsal base of the second digit. No acute displaced fracture or dislocation. Posterior and plantar calcaneal spurs. Subcutaneus soft tissue edema with overlying skin staples. IMPRESSION: Interval development of cortical erosion of the first metatarsal base. Concern for acute osteomyelitis. Vague indeterminate periosteal reaction along the metatarsal base of the second digit. Consider correlation with MRI. Electronically Signed   By: Tish Frederickson M.D.   On: 02/19/2023 18:06        Scheduled Meds:  busPIRone  15 mg Oral TID   calcium-vitamin D  1 tablet Oral Daily   Chlorhexidine Gluconate Cloth  6 each Topical Q0600   enoxaparin (LOVENOX) injection  50 mg Subcutaneous Q24H   escitalopram  10 mg Oral Daily   estradiol  1 mg Oral QHS   famotidine  40 mg Oral QHS   fesoterodine  4 mg Oral Daily   haloperidol  5 mg Oral QHS   lamoTRIgine  100 mg Oral Daily   loratadine  10 mg Oral QPM   metoprolol succinate  25 mg Oral Daily   multivitamin with minerals  1 tablet Oral Daily   mupirocin ointment  1 Application Nasal BID   pantoprazole  40 mg Oral BID   prazosin  2 mg Oral QHS   pregabalin  100 mg Oral TID   sulfamethoxazole-trimethoprim  1 tablet Oral Q12H   topiramate  50 mg Oral BID   traZODone  200 mg Oral QHS   Continuous Infusions:     LOS: 3 days    Time spent: 45 minutes spent on chart review, discussion with nursing staff, consultants, updating family and interview/physical exam; more than 50% of that time was spent in counseling and/or coordination of care.  Joseph Art, DO [facility] Available via Epic secure chat 7am-7pm After these hours, please refer to coverage provider listed on [email or web address] 02/21/2023, 12:05 PM

## 2023-02-21 NOTE — NC FL2 (Signed)
MEDICAID FL2 LEVEL OF CARE FORM     IDENTIFICATION  Patient Name: [name] [name] Birthdate: 1965-04-30 Sex: female Admission Date (Current Location): 02/18/2023  [facility] and [place] Number:  Producer, television/film/video and Address:  [facility], [address] [place], [place] [address]      Provider Number: [id number]  Attending Physician Name and Address:  Joseph Art, DO  Relative Name and Phone Number:  Daivd Council; Mother; [phone]    Current Level of Care: Hospital Recommended Level of Care: Skilled Nursing Facility Prior Approval Number:    Date Approved/Denied:   PASRR Number:    Discharge Plan: SNF    Current Diagnoses: Patient Active Problem List   Diagnosis Date Noted   Osteomyelitis of right foot ([name]) 02/18/2023   Low back pain, unspecified 11/19/2022   Acute kidney injury superimposed on CKD ([name]) 07/20/2022   Pain of left sacroiliac joint 07/19/2022   Acute cystitis with hematuria 07/19/2022   Hypotension 07/19/2022   Chronic kidney disease, stage III (moderate) ([name]) 07/19/2022   Anemia of chronic disease 07/19/2022   Acute encephalopathy 07/06/2022   Chronic diastolic CHF (congestive heart failure) ([name]) 07/06/2022   Bilateral leg edema 06/27/2022   Right foot infection 05/27/2022   Morbid obesity ([name]) 05/26/2022   Cellulitis of right foot 05/22/2022   Sepsis secondary to UTI ([name]) 05/22/2022   Abscess of right foot 05/22/2022   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   Diarrhea in adult patient    Weakness 06/30/2021   Gastroenteritis, infectious 06/30/2021   Hyponatremia 06/30/2021   Open wound of right foot, initial encounter 10/21/2020   Finger ulcer ([name]) 10/21/2020   Non-healing open wound of toe 05/15/2019   Osteomyelitis ([name]) 05/15/2019   Palpitations 10/26/2018   Foot ulcer ([name]) 01/31/2018   Hyperglycemia 01/31/2018   Left leg cellulitis 09/24/2017   Near  syncope 11/23/2016   Anxiety 09/23/2016   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[name]) 09/23/2016   Constipation 09/23/2016   DDD (degenerative disc disease), lumbosacral 09/23/2016   Migraines 09/23/2016   Myelopathy ([name]) 09/23/2016   Post laminectomy syndrome 09/23/2016   Pseudoarthrosis of lumbar spine 09/23/2016   Sleep apnea 09/23/2016   Spinal stenosis of lumbar region 09/23/2016   Vertigo 09/23/2016   Hallux malleus 11/15/2015   Chronic pain syndrome    GERD (gastroesophageal reflux disease)    Depression    Rash 11/14/2015   Nonspecific chest pain    Obesity (BMI 30-39.9) 10/26/2014   Hypokalemia 10/26/2014   Manic bipolar I disorder ([name]) 10/26/2014   Tardive dyskinesia 08/26/2011   Manic bipolar I disorder with rapid cycling ([name]) 05/18/2011   Asthma 02/24/2011   History of migraine headaches 02/24/2011    Orientation RESPIRATION BLADDER Height & Weight     Self, Time, Situation, Place  Normal Continent Weight: 239 lb (108.4 kg) Height:  5' 4.02" (162.6 cm)  BEHAVIORAL SYMPTOMS/MOOD NEUROLOGICAL BOWEL NUTRITION STATUS      Continent Diet (Please see dc summary)  AMBULATORY STATUS COMMUNICATION OF NEEDS Skin   Extensive Assist Verbally Surgical wounds (AMPUTATION FOOT, revision and Incision and drainage (Right) (closed incision))                       Personal Care Assistance Level of Assistance  Bathing, Feeding, Dressing Bathing Assistance: Maximum assistance Feeding assistance: Maximum assistance Dressing Assistance: Maximum assistance     Functional Limitations Info  Sight  Sight Info: Impaired        SPECIAL CARE FACTORS FREQUENCY  PT (By licensed PT), OT (By licensed OT)     PT Frequency: 5x OT Frequency: 5x            Contractures Contractures Info: Not present    Additional Factors Info  Code Status, Allergies Code Status Info: Full Code Allergies Info: Tetracyclines & Related; Tramadol; Oxybutynin; Phenazopyridine; Ciprofloxacin;  Codeine; Estradiol           Current Medications (02/21/2023):  This is the current hospital active medication list Current Facility-Administered Medications  Medication Dose Route Frequency Provider Last Rate Last Admin   acetaminophen (TYLENOL) tablet 650 mg  650 mg Oral Q6H PRN Louann Sjogren, DPM       Or   acetaminophen (TYLENOL) suppository 650 mg  650 mg Rectal Q6H PRN Louann Sjogren, DPM       busPIRone (BUSPAR) tablet 15 mg  15 mg Oral TID Louann Sjogren, DPM   15 mg at 02/21/23 0948   calcium-vitamin D (OSCAL WITH D) 500-5 MG-MCG per tablet 1 tablet  1 tablet Oral Daily Louann Sjogren, DPM   1 tablet at 02/21/23 0950   Chlorhexidine Gluconate Cloth 2 % PADS 6 each  6 each Topical Q0600 Marlin Canary U, DO   6 each at 02/21/23 0950   enoxaparin (LOVENOX) injection 50 mg  50 mg Subcutaneous Q24H Louann Sjogren, DPM   50 mg at 02/20/23 1635   escitalopram (LEXAPRO) tablet 10 mg  10 mg Oral Daily Louann Sjogren, DPM   10 mg at 02/21/23 1610   estradiol (ESTRACE) tablet 1 mg  1 mg Oral QHS Louann Sjogren, DPM   1 mg at 02/20/23 2200   famotidine (PEPCID) tablet 40 mg  40 mg Oral QHS Louann Sjogren, DPM   40 mg at 02/20/23 2159   fesoterodine (TOVIAZ) tablet 4 mg  4 mg Oral Daily Louann Sjogren, DPM   4 mg at 02/21/23 0949   haloperidol (HALDOL) tablet 5 mg  5 mg Oral QHS Louann Sjogren, DPM   5 mg at 02/20/23 2159   lamoTRIgine (LAMICTAL) tablet 100 mg  100 mg Oral Daily Louann Sjogren, DPM   100 mg at 02/21/23 0949   loratadine (CLARITIN) tablet 10 mg  10 mg Oral QPM Louann Sjogren, DPM   10 mg at 02/20/23 1704   methocarbamol (ROBAXIN) tablet 500 mg  500 mg Oral Q8H PRN Louann Sjogren, DPM       metoprolol succinate (TOPROL-XL) 24 hr tablet 25 mg  25 mg Oral Daily Louann Sjogren, DPM   25 mg at 02/21/23 9604   multivitamin with minerals tablet 1 tablet  1 tablet Oral Daily Louann Sjogren, DPM   1 tablet at 02/21/23 0949   mupirocin ointment (BACTROBAN) 2 % 1 Application   1 Application Nasal BID Marlin Canary U, DO   1 Application at 02/21/23 0950   ondansetron (ZOFRAN) tablet 4 mg  4 mg Oral Q6H PRN Louann Sjogren, DPM       Or   ondansetron (ZOFRAN) injection 4 mg  4 mg Intravenous Q6H PRN Louann Sjogren, DPM   4 mg at 02/18/23 1944   oxyCODONE-acetaminophen (PERCOCET/ROXICET) 5-325 MG per tablet 1 tablet  1 tablet Oral Q6H PRN Louann Sjogren, DPM       And   oxyCODONE (Oxy IR/ROXICODONE) immediate release tablet 5 mg  5 mg Oral Q6H PRN Louann Sjogren, DPM   5 mg at 02/21/23 [phone]  pantoprazole (PROTONIX) EC tablet 40 mg  40 mg Oral BID Louann Sjogren, DPM   40 mg at 02/21/23 0949   prazosin (MINIPRESS) capsule 2 mg  2 mg Oral QHS Louann Sjogren, DPM   2 mg at 02/20/23 2200   pregabalin (LYRICA) capsule 100 mg  100 mg Oral TID Louann Sjogren, DPM   100 mg at 02/21/23 1610   senna-docusate (Senokot-S) tablet 1 tablet  1 tablet Oral QHS PRN Louann Sjogren, DPM   1 tablet at 02/20/23 2159   sulfamethoxazole-trimethoprim (BACTRIM DS) 800-160 MG per tablet 1 tablet  1 tablet Oral Q12H Vann, Jessica U, DO       topiramate (TOPAMAX) tablet 50 mg  50 mg Oral BID Louann Sjogren, DPM   50 mg at 02/21/23 0949   traZODone (DESYREL) tablet 200 mg  200 mg Oral QHS Louann Sjogren, DPM   200 mg at 02/20/23 2158     Discharge Medications: Please see discharge summary for a list of discharge medications.  Relevant Imaging Results:  Relevant Lab Results:   Additional Information SS# [id number]  Marliss Coots, LCSW

## 2023-02-22 ENCOUNTER — Ambulatory Visit: Payer: 59 | Admitting: Physician Assistant

## 2023-02-22 DIAGNOSIS — M868X7 Other osteomyelitis, ankle and foot: Secondary | ICD-10-CM | POA: Diagnosis not present

## 2023-02-22 LAB — BASIC METABOLIC PANEL
Anion gap: 7 (ref 5–15)
BUN: 9 mg/dL (ref 6–20)
CO2: 22 mmol/L (ref 22–32)
Calcium: 8.5 mg/dL — ABNORMAL LOW (ref 8.9–10.3)
Chloride: 109 mmol/L (ref 98–111)
Creatinine, Ser: 0.89 mg/dL (ref 0.44–1.00)
GFR, Estimated: >60 mL/min (ref >60)
Glucose, Bld: 92 mg/dL (ref 70–99)
Potassium: 3.5 mmol/L (ref 3.5–5.1)
Sodium: 138 mmol/L (ref 135–145)

## 2023-02-22 LAB — CBC
HCT: 22.7 % — ABNORMAL LOW (ref 36.0–46.0)
Hemoglobin: 7.4 g/dL — ABNORMAL LOW (ref 12.0–15.0)
MCH: 31.8 pg (ref 26.0–34.0)
MCHC: 32.6 g/dL (ref 30.0–36.0)
MCV: 97.4 fL (ref 80.0–100.0)
Platelets: 247 10*3/uL (ref 150–400)
RBC: 2.33 MIL/uL — ABNORMAL LOW (ref 3.87–5.11)
RDW: 12.7 % (ref 11.5–15.5)
WBC: 4.4 10*3/uL (ref 4.0–10.5)
nRBC: 0 % (ref 0.0–0.2)

## 2023-02-22 LAB — SURGICAL PATHOLOGY

## 2023-02-22 MED ORDER — POLYETHYLENE GLYCOL 3350 17 G PO PACK
17.0000 g | PACK | Freq: Every day | ORAL | Status: DC
  Administered 2023-02-22 – 2023-02-24 (×3): 17 g via ORAL
  Filled 2023-02-22 (×3): qty 1

## 2023-02-22 MED ORDER — BISACODYL 10 MG RE SUPP: 10.0000 mg | Freq: Every day | RECTAL | Status: DC | PRN

## 2023-02-22 NOTE — TOC Progression Note (Signed)
Transition of Care [facility]) - Progression Note    Patient Details  Name: [name] [name] MRN: [id number] Date of Birth: [birth year]  [facility]) CM/SW Contact  Marliss Coots, LCSW Phone Number: 02/22/2023, 2:34 PM  Clinical Narrative:     This CSW reintroduced themself and their role to patient. CSW provided patient with current SNF offers ([address], [address], [facility], [facility]) and [facility]. CSW stated to patient that they will follow-up on other SNF offers but emphasized importance of reviewing current one's to prevent discharge delay. Patient expressed understanding of this information. CSW also stated that they will follow-up tomorrow SNF decision. Patient displayed understanding of this information, as well.  Expected Discharge Plan: Skilled Nursing Facility Barriers to Discharge: Insurance Authorization, SNF Pending bed offer, SNF Pending discharge orders, Continued Medical Work up  Expected Discharge Plan and Services In-house Referral: Clinical Social Work   Post Acute Care Choice: Skilled Nursing Facility Living arrangements for the past 2 months: Single Family Home                                       Social Determinants of Health (SDOH) Interventions SDOH Screenings   Food Insecurity: No Food Insecurity (02/18/2023)  Housing: Low Risk  (02/18/2023)  Transportation Needs: No Transportation Needs (02/18/2023)  Utilities: Not At Risk (02/18/2023)  Alcohol Screen: Low Risk  (08/21/2020)  Depression (PHQ2-9): High Risk (02/18/2023)  Financial Resource Strain: Low Risk  (07/27/2022)  Physical Activity: Inactive (07/26/2022)  Social Connections: Unknown (08/30/2021)   [facility] from [facility], [facility]  Stress: No Stress Concern Present (11/19/2022)  Tobacco Use: Low Risk  (02/19/2023)  Health Literacy: Adequate Health Literacy (11/19/2022)    Readmission Risk Interventions    07/21/2022    2:43 PM 06/25/2022    1:44  PM  Readmission Risk Prevention Plan  Transportation Screening Complete Complete  PCP or Specialist Appt within 3-5 Days  Complete  HRI or Home Care Consult  Complete  Social Work Consult for Recovery Care Planning/Counseling  --  Palliative Care Screening  Not Applicable  Medication Review Ocean[employer]) Complete Complete  HRI or Home Care Consult Complete   SW Recovery Care/Counseling Consult Complete   Palliative Care Screening Not Applicable   Skilled Nursing Facility Not Applicable

## 2023-02-22 NOTE — [place] FL2 (Signed)
[place] MEDICAID FL2 LEVEL OF CARE FORM     IDENTIFICATION  Patient Name: [name] [name] Birthdate: April 30, 1965 Sex: female Admission Date (Current Location): 02/18/2023  [facility] and [place] Number:  Producer, television/film/video and Address:  [facility], [address] [place], [place] [address]      Provider Number: [id number]  Attending Physician Name and Address:  Joseph Art, DO  Relative Name and Phone Number:  Daivd Council; Mother; [phone]    Current Level of Care: Hospital Recommended Level of Care: Skilled Nursing Facility Prior Approval Number:    Date Approved/Denied: 02/22/23 PASRR Number: [id number]  Discharge Plan: SNF    Current Diagnoses: Patient Active Problem List   Diagnosis Date Noted   Osteomyelitis of right foot ([name]) 02/18/2023   Low back pain, unspecified 11/19/2022   Acute kidney injury superimposed on CKD ([name]) 07/20/2022   Pain of left sacroiliac joint 07/19/2022   Acute cystitis with hematuria 07/19/2022   Hypotension 07/19/2022   Chronic kidney disease, stage III (moderate) ([name]) 07/19/2022   Anemia of chronic disease 07/19/2022   Acute encephalopathy 07/06/2022   Chronic diastolic CHF (congestive heart failure) ([name]) 07/06/2022   Bilateral leg edema 06/27/2022   Right foot infection 05/27/2022   Morbid obesity ([name]) 05/26/2022   Cellulitis of right foot 05/22/2022   Sepsis secondary to UTI ([name]) 05/22/2022   Abscess of right foot 05/22/2022   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   Diarrhea in adult patient    Weakness 06/30/2021   Gastroenteritis, infectious 06/30/2021   Hyponatremia 06/30/2021   Open wound of right foot, initial encounter 10/21/2020   Finger ulcer ([name]) 10/21/2020   Non-healing open wound of toe 05/15/2019   Osteomyelitis ([name]) 05/15/2019   Palpitations 10/26/2018   Foot ulcer ([name]) 01/31/2018   Hyperglycemia 01/31/2018   Left leg cellulitis  09/24/2017   Near syncope 11/23/2016   Anxiety 09/23/2016   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[name]) 09/23/2016   Constipation 09/23/2016   DDD (degenerative disc disease), lumbosacral 09/23/2016   Migraines 09/23/2016   Myelopathy ([name]) 09/23/2016   Post laminectomy syndrome 09/23/2016   Pseudoarthrosis of lumbar spine 09/23/2016   Sleep apnea 09/23/2016   Spinal stenosis of lumbar region 09/23/2016   Vertigo 09/23/2016   Hallux malleus 11/15/2015   Chronic pain syndrome    GERD (gastroesophageal reflux disease)    Depression    Rash 11/14/2015   Nonspecific chest pain    Obesity (BMI 30-39.9) 10/26/2014   Hypokalemia 10/26/2014   Manic bipolar I disorder ([name]) 10/26/2014   Tardive dyskinesia 08/26/2011   Manic bipolar I disorder with rapid cycling ([name]) 05/18/2011   Asthma 02/24/2011   History of migraine headaches 02/24/2011    Orientation RESPIRATION BLADDER Height & Weight     Self, Time, Situation, Place  Normal Continent Weight: 239 lb (108.4 kg) Height:  5' 4.02" (162.6 cm)  BEHAVIORAL SYMPTOMS/MOOD NEUROLOGICAL BOWEL NUTRITION STATUS      Continent Diet (Please see dc summary)  AMBULATORY STATUS COMMUNICATION OF NEEDS Skin   Extensive Assist Verbally Surgical wounds (AMPUTATION FOOT, revision and Incision and drainage (Right))                       Personal Care Assistance Level of Assistance  Bathing, Feeding, Dressing Bathing Assistance: Maximum assistance Feeding assistance: Maximum assistance Dressing Assistance: Maximum assistance     Functional Limitations Info  Sight Sight Info: Impaired  SPECIAL CARE FACTORS FREQUENCY  PT (By licensed PT), OT (By licensed OT)     PT Frequency: 5x OT Frequency: 5x            Contractures Contractures Info: Not present    Additional Factors Info  Code Status, Allergies Code Status Info: Full Code Allergies Info: Tetracyclines & Related; Tramadol; Oxybutynin; Phenazopyridine; Ciprofloxacin;  Codeine; Estradiol           Current Medications (02/22/2023):  This is the current hospital active medication list Current Facility-Administered Medications  Medication Dose Route Frequency Provider Last Rate Last Admin   acetaminophen (TYLENOL) tablet 650 mg  650 mg Oral Q6H PRN Louann Sjogren, DPM       Or   acetaminophen (TYLENOL) suppository 650 mg  650 mg Rectal Q6H PRN Louann Sjogren, DPM       bisacodyl (DULCOLAX) suppository 10 mg  10 mg Rectal Daily PRN Vann, Jessica U, DO       busPIRone (BUSPAR) tablet 15 mg  15 mg Oral TID Louann Sjogren, DPM   15 mg at 02/21/23 2149   calcium-vitamin D (OSCAL WITH D) 500-5 MG-MCG per tablet 1 tablet  1 tablet Oral Daily Louann Sjogren, DPM   1 tablet at 02/21/23 0950   Chlorhexidine Gluconate Cloth 2 % PADS 6 each  6 each Topical Q0600 Marlin Canary U, DO   6 each at 02/21/23 0950   enoxaparin (LOVENOX) injection 50 mg  50 mg Subcutaneous Q24H Louann Sjogren, DPM   50 mg at 02/21/23 1735   escitalopram (LEXAPRO) tablet 10 mg  10 mg Oral Daily Louann Sjogren, DPM   10 mg at 02/21/23 0981   estradiol (ESTRACE) tablet 1 mg  1 mg Oral QHS Louann Sjogren, DPM   1 mg at 02/21/23 2147   famotidine (PEPCID) tablet 40 mg  40 mg Oral QHS Louann Sjogren, DPM   40 mg at 02/21/23 2148   fesoterodine (TOVIAZ) tablet 4 mg  4 mg Oral Daily Louann Sjogren, DPM   4 mg at 02/21/23 0949   haloperidol (HALDOL) tablet 5 mg  5 mg Oral QHS Louann Sjogren, DPM   5 mg at 02/21/23 2148   lamoTRIgine (LAMICTAL) tablet 100 mg  100 mg Oral Daily Louann Sjogren, DPM   100 mg at 02/21/23 0949   loratadine (CLARITIN) tablet 10 mg  10 mg Oral QPM Louann Sjogren, DPM   10 mg at 02/21/23 1735   methocarbamol (ROBAXIN) tablet 500 mg  500 mg Oral Q8H PRN Louann Sjogren, DPM       metoprolol succinate (TOPROL-XL) 24 hr tablet 25 mg  25 mg Oral Daily Louann Sjogren, DPM   25 mg at 02/21/23 1914   multivitamin with minerals tablet 1 tablet  1 tablet Oral Daily Louann Sjogren, DPM   1 tablet at 02/21/23 0949   mupirocin ointment (BACTROBAN) 2 % 1 Application  1 Application Nasal BID Marlin Canary U, DO   1 Application at 02/21/23 2150   ondansetron (ZOFRAN) tablet 4 mg  4 mg Oral Q6H PRN Louann Sjogren, DPM       Or   ondansetron (ZOFRAN) injection 4 mg  4 mg Intravenous Q6H PRN Louann Sjogren, DPM   4 mg at 02/18/23 1944   oxyCODONE-acetaminophen (PERCOCET/ROXICET) 5-325 MG per tablet 1 tablet  1 tablet Oral Q6H PRN Louann Sjogren, DPM   1 tablet at 02/22/23 7829   And   oxyCODONE (Oxy IR/ROXICODONE) immediate release tablet 5 mg  5 mg Oral  Q6H PRN Louann Sjogren, DPM   5 mg at 02/22/23 0138   pantoprazole (PROTONIX) EC tablet 40 mg  40 mg Oral BID Louann Sjogren, DPM   40 mg at 02/21/23 2149   polyethylene glycol (MIRALAX / GLYCOLAX) packet 17 g  17 g Oral Daily Vann, Jessica U, DO       prazosin (MINIPRESS) capsule 2 mg  2 mg Oral QHS Louann Sjogren, DPM   2 mg at 02/21/23 2147   pregabalin (LYRICA) capsule 100 mg  100 mg Oral TID Louann Sjogren, DPM   100 mg at 02/21/23 2148   senna-docusate (Senokot-S) tablet 1 tablet  1 tablet Oral QHS PRN Louann Sjogren, DPM   1 tablet at 02/20/23 2159   sulfamethoxazole-trimethoprim (BACTRIM DS) 800-160 MG per tablet 1 tablet  1 tablet Oral Q12H Marlin Canary U, DO   1 tablet at 02/21/23 2147   topiramate (TOPAMAX) tablet 50 mg  50 mg Oral BID Louann Sjogren, DPM   50 mg at 02/21/23 2147   traZODone (DESYREL) tablet 200 mg  200 mg Oral QHS Louann Sjogren, DPM   200 mg at 02/21/23 2149     Discharge Medications: Please see discharge summary for a list of discharge medications.  Relevant Imaging Results:  Relevant Lab Results:   Additional Information SS# [id number]; Amedeo Kinsman expires 03/24/2023  Marliss Coots, LCSW

## 2023-02-22 NOTE — Progress Notes (Signed)
  Subjective:  Patient ID: [name] [name], female    DOB: 1965/05/25,  MRN: [id number]  Chief Complaint  Patient presents with   Wound Infection    DOS: 02/19/23 Procedure:  Right revision transmetatarsal amputation with irrigation and debridement   57 y.o. female seen for post op check. Awaiting SNF placement. Doing well denies pain. Dressing intact.  Review of Systems: Negative except as noted in the HPI. Denies N/V/F/Ch.   Objective:   Vitals:   02/22/23 0742 02/22/23 0918  BP: 108/71   Pulse: (!) 55 64  Resp:    Temp: 98.2 F (36.8 C)   SpO2: 98%    Body mass index is 41 kg/m. Constitutional Well developed. Well nourished.  Vascular Foot warm and well perfused. Capillary refill normal to all digits.  Calf is soft and supple, no posterior calf or knee pain, negative Homans' sign  Neurologic Normal speech. Oriented to person, place, and time. Epicritic sensation to light touch grossly absent to foot  Dermatologic Dressing C/D/I  Orthopedic: No tenderness to palpation noted about the surgical site.   Multiple view plain film radiographs: No post op XR available Assessment:   1. Other osteomyelitis of right foot ([name])    Plan:  Patient was evaluated and treated and all questions answered.  S/p foot surgery right revision TMA -Progressing as expected post-operatively. Dressing to remain intact until follow up early next week in [place] office -XR: Deferred -WB Status: NWB in post op shoe, ordered new shoe for patient as old one doesn't fit -Sutures: to remain intact. -Medications: recommend 2 weeks PO abx upon discharge, Gram neg rods - Cephalexin 500 TID x 10 days probably sufficient -Dressing to remain c/d/I Patient is ok for discharge from podiatry standpoint and will follow with [place] in clinic in about 1 week.          Corinna Gab, DPM [facility]

## 2023-02-22 NOTE — Plan of Care (Signed)

## 2023-02-22 NOTE — Progress Notes (Signed)
PROGRESS NOTE    [name] [name]  ZOX:[id number] DOB: March 26, 1966 DOA: 02/18/2023 PCP: Donita Brooks, MD    Brief Narrative:   [name] [name] is a 57 y.o. female with medical history significant of HTN, bipolar disorder, OSA, CKD stage II, chronic diastolic heart failure, chronic pain, asthma, anxiety and depression, GERD, prediabetes and s/p right transmetatarsal amputation who presented to the ED due to concern for osteomyelitis of the right foot.  Patient reports that she had a an infection on her right lower extremity stump 2 months ago that progressed from an ulcer to an open draining wound.  She has been going to the wound care center for wound care.  During her last evaluation on 10/29, there was a concern for progression to deep bone infection so an MRI was obtained on 10/30.  MRI results showed signs of acute osteomyelitis and during patient's visit to her PCP today, she was advised to come to the ED for IV antibiotics and further management of her right foot infection.   OR on 11/2.   Assessment and Plan:  Osteomyelitis of the right foot Prediabetic patient with a history of peripheral neuropathy and right transmetatarsal amputation 3 years ago with recent right foot wound found to have evidence of osteomyelitis, 1.0 x 2.3 x 4.7 cm abscess and infectious myositis on MRI.  . -Podiatry consulted-- OR on 11/2-- seems to have gotten a surgical cure-- will likely need two weeks oral antibiotics upon discharge  - IV vancomycin and Zosyn - change to PO abx per pharmacy recs and finish course of 2 weeks PT eval- SNF   HTN/Chronic diastolic HF No evidence of heart failure exacerbation on exam.  BP stable with SBP in the 110s to 130s. -Continue metoprolol    Chronic pain -Resume pain meds, Lyrica and muscle relaxer    GERD -Resume famotidine and Protonix   Bipolar disorder Anxiety and depression -Resume all psych meds    Class III obesity Prediabetes Body mass index is 41.02  kg/m.  Last A1c 5.4% 7 months ago. -Nutrition counseling   Difficulty urinating -check bladder scan  Constipation -bowel regimen   DVT prophylaxis:     Code Status: Full Code   Disposition Plan:  Level of care: Med-Surg Status is: Inpatient Remains inpatient appropriate     Consultants:  podiatry  Subjective: C/o difficulty urinating  Objective: Vitals:   02/21/23 2146 02/22/23 0649 02/22/23 0742 02/22/23 0918  BP: 107/61 106/66 108/71   Pulse: (!) 59 (!) 52 (!) 55 64  Resp: 18 17    Temp: 98.5 F (36.9 C) 97.8 F (36.6 C) 98.2 F (36.8 C)   TempSrc: Oral Oral Oral   SpO2: 99% 97% 98%   Weight:      Height:        Intake/Output Summary (Last 24 hours) at 02/22/2023 1052 Last data filed at 02/22/2023 8119 Gross per 24 hour  Intake 120 ml  Output 1025 ml  Net -905 ml   Filed Weights   02/18/23 1343 02/19/23 1005  Weight: 108.4 kg 108.4 kg    Examination:   General: Appearance:    Severely obese female in no acute distress     Lungs:     respirations unlabored  Heart:    Normal heart rate.     Neurologic:   Awake, alert       Data Reviewed: I have personally reviewed following labs and imaging studies  CBC: Recent Labs  Lab 02/18/23 1353 02/19/23  1610 02/20/23 0423 02/21/23 0709 02/22/23 0621  WBC 8.3 8.5 7.7 6.2 4.4  NEUTROABS 6.7  --   --   --   --   HGB 9.5* 8.7* 7.7* 7.8* 7.4*  HCT 29.6* 26.4* 23.7* 24.1* 22.7*  MCV 98.7 97.1 97.1 96.8 97.4  PLT 228 207 209 235 247   Basic Metabolic Panel: Recent Labs  Lab 02/18/23 1353 02/19/23 0419 02/20/23 0423 02/21/23 0709 02/22/23 0621  NA 137 133* 138 137 138  K 3.8 3.3* 3.1* 3.6 3.5  CL 106 104 107 108 109  CO2 20* 20* 19* 21* 22  GLUCOSE 113* 117* 115* 115* 92  BUN 11 8 9 8 9   CREATININE 0.84 0.93 1.07* 1.14* 0.89  CALCIUM 8.9 8.4* 8.3* 8.6* 8.5*   GFR: Estimated Creatinine Clearance: 81.9 mL/min (by C-G formula based on SCr of 0.89 mg/dL). Liver Function Tests: Recent  Labs  Lab 02/18/23 1353  AST 10*  ALT 10  ALKPHOS 72  BILITOT 0.3  PROT 7.0  ALBUMIN 3.2*   No results for input(s): "LIPASE", "AMYLASE" in the last 168 hours. No results for input(s): "AMMONIA" in the last 168 hours. Coagulation Profile: No results for input(s): "INR", "PROTIME" in the last 168 hours. Cardiac Enzymes: No results for input(s): "CKTOTAL", "CKMB", "CKMBINDEX", "TROPONINI" in the last 168 hours. BNP (last 3 results) No results for input(s): "PROBNP" in the last 8760 hours. HbA1C: No results for input(s): "HGBA1C" in the last 72 hours. CBG: No results for input(s): "GLUCAP" in the last 168 hours. Lipid Profile: No results for input(s): "CHOL", "HDL", "LDLCALC", "TRIG", "CHOLHDL", "LDLDIRECT" in the last 72 hours. Thyroid Function Tests: No results for input(s): "TSH", "T4TOTAL", "FREET4", "T3FREE", "THYROIDAB" in the last 72 hours. Anemia Panel: No results for input(s): "VITAMINB12", "FOLATE", "FERRITIN", "TIBC", "IRON", "RETICCTPCT" in the last 72 hours. Sepsis Labs: Recent Labs  Lab 02/18/23 1420 02/18/23 1556  LATICACIDVEN 0.9 0.8    Recent Results (from the past 240 hour(s))  Blood culture (routine x 2)     Status: None (Preliminary result)   Collection Time: 02/18/23  3:45 PM   Specimen: BLOOD  Result Value Ref Range Status   Specimen Description BLOOD SITE NOT SPECIFIED  Final   Special Requests   Final    BOTTLES DRAWN AEROBIC AND ANAEROBIC Blood Culture results may not be optimal due to an excessive volume of blood received in culture bottles   Culture   Final    NO GROWTH 3 DAYS Performed at [facility], [address] [place], [place] [address]    Report Status PENDING  Incomplete  Blood culture (routine x 2)     Status: None (Preliminary result)   Collection Time: 02/18/23  3:45 PM   Specimen: BLOOD  Result Value Ref Range Status   Specimen Description BLOOD SITE NOT SPECIFIED  Final   Special Requests   Final    BOTTLES DRAWN  AEROBIC AND ANAEROBIC Blood Culture adequate volume   Culture   Final    NO GROWTH 3 DAYS Performed at [facility], [address] [place], [place] [address]    Report Status PENDING  Incomplete  Surgical pcr screen     Status: Abnormal   Collection Time: 02/19/23 10:26 AM   Specimen: Nasal Mucosa; Nasal Swab  Result Value Ref Range Status   MRSA, PCR NEGATIVE NEGATIVE Final   Staphylococcus aureus POSITIVE (A) NEGATIVE Final    Comment: (NOTE) The Xpert SA Assay (FDA approved  for NASAL specimens in patients 13 years of age and older), is one component of a comprehensive surveillance program. It is not intended to diagnose infection nor to guide or monitor treatment. Performed at [facility], [address] [place], [place] [address]   Aerobic/Anaerobic Culture w Gram Stain (surgical/deep wound)     Status: None (Preliminary result)   Collection Time: 02/19/23 12:12 PM   Specimen: Bone; Tissue  Result Value Ref Range Status   Specimen Description BONE  Final   Special Requests INFECTED RIGHT BONE  Final   Gram Stain RARE WBC SEEN RARE GRAM NEGATIVE RODS   Final   Culture   Final    NO GROWTH 3 DAYS NO ANAEROBES ISOLATED; CULTURE IN PROGRESS FOR 5 DAYS Performed at [facility], [address] [place], [place] [address]    Report Status PENDING  Incomplete  Aerobic/Anaerobic Culture w Gram Stain (surgical/deep wound)     Status: None (Preliminary result)   Collection Time: 02/19/23 12:13 PM   Specimen: Bone; Tissue  Result Value Ref Range Status   Specimen Description BONE  Final   Special Requests RIGHT FOOT RESIDUAL BONE METATARSAL  Final   Gram Stain NO WBC SEEN NO ORGANISMS SEEN   Final   Culture   Final    NO GROWTH 3 DAYS NO ANAEROBES ISOLATED; CULTURE IN PROGRESS FOR 5 DAYS Performed at [facility], [address], [place], [place] [address]    Report Status PENDING  Incomplete         Radiology Studies: No results  found.      Scheduled Meds:  busPIRone  15 mg Oral TID   calcium-vitamin D  1 tablet Oral Daily   Chlorhexidine Gluconate Cloth  6 each Topical Q0600   enoxaparin (LOVENOX) injection  50 mg Subcutaneous Q24H   escitalopram  10 mg Oral Daily   estradiol  1 mg Oral QHS   famotidine  40 mg Oral QHS   fesoterodine  4 mg Oral Daily   haloperidol  5 mg Oral QHS   lamoTRIgine  100 mg Oral Daily   loratadine  10 mg Oral QPM   metoprolol succinate  25 mg Oral Daily   multivitamin with minerals  1 tablet Oral Daily   mupirocin ointment  1 Application Nasal BID   pantoprazole  40 mg Oral BID   polyethylene glycol  17 g Oral Daily   prazosin  2 mg Oral QHS   pregabalin  100 mg Oral TID   sulfamethoxazole-trimethoprim  1 tablet Oral Q12H   topiramate  50 mg Oral BID   traZODone  200 mg Oral QHS   Continuous Infusions:     LOS: 4 days    Time spent: 45 minutes spent on chart review, discussion with nursing staff, consultants, updating family and interview/physical exam; more than 50% of that time was spent in counseling and/or coordination of care.    Joseph Art, DO [facility] Available via Epic secure chat 7am-7pm After these hours, please refer to coverage provider listed on [email or web address] 02/22/2023, 10:52 AM

## 2023-02-22 NOTE — Progress Notes (Signed)
Orthopedic Tech Progress Note Patient Details:  NITYA CAUTHON 1965/10/17 [id number]  Ortho Devices Type of Ortho Device: Postop shoe/boot Ortho Device/Splint Location: RLE Ortho Device/Splint Interventions: Ordered, Application   Post Interventions Patient Tolerated: Well Instructions Provided: Adjustment of device  Kynleigh Artz A Cohen Doleman 02/22/2023, 5:13 PM

## 2023-02-22 NOTE — Progress Notes (Addendum)
Occupational Therapy Treatment Patient Details Name: [name] [name] MRN: [id number] DOB: 09-24-1965 Today's Date: 02/22/2023   History of present illness 57 yo female presents to [facility] 11/1 with R foot osteomyelitis. S/p R TMA revision with I&D on 11/2. PMH includes  GERD, hypertension, asthma, prior TMA R foot, depression, OSA, bipolar disorder, obesity.   OT comments  Pt making slow progress towards goals, needing min A for pivot transfer today with RW, cues for WB precautions. Pt pivoting to [facility] needing total A for pericare as she typically uses a toileting aid at home. Pt supervision for bed mobility. Pt presenting with impairments listed below, will follow acutely. Updating d/c recommendation, Patient will benefit from continued inpatient follow up therapy, <3 hours/day to maximize safety/ind with ADLs/functional mobility.  Addendum 1651: Noted podiatry note signed after OT session states pt NWB in post op shoe and post op shoe ordered after OT session had already been conducted. Pt adhering to NWB precautions throughout session. Will educate on wearing of post op shoe in future sessions.       If plan is discharge home, recommend the following:  A little help with walking and/or transfers;A lot of help with bathing/dressing/bathroom;Assistance with cooking/housework;Direct supervision/assist for medications management;Direct supervision/assist for financial management;Assist for transportation;Help with stairs or ramp for entrance   Equipment Recommendations  Other (comment) (defer)    Recommendations for Other Services PT consult    Precautions / Restrictions Precautions Precautions: Fall Restrictions Weight Bearing Restrictions: Yes RLE Weight Bearing: Non weight bearing       Mobility Bed Mobility Overal bed mobility: Needs Assistance Bed Mobility: Supine to Sit, Sit to Supine     Supine to sit: Supervision Sit to supine: Supervision        Transfers Overall  transfer level: Needs assistance Equipment used: Rolling walker (2 wheels) Transfers: Sit to/from Stand, Bed to chair/wheelchair/BSC Sit to Stand: Min assist Stand pivot transfers: Min assist               Balance Overall balance assessment: Needs assistance Sitting-balance support: No upper extremity supported, Feet supported Sitting balance-Leahy Scale: Fair     Standing balance support: Bilateral upper extremity supported, During functional activity, Reliant on assistive device for balance Standing balance-Leahy Scale: Poor                             ADL either performed or assessed with clinical judgement   ADL Overall ADL's : Needs assistance/impaired                         Toilet Transfer: Minimal assistance Toilet Transfer Details (indicate cue type and reason): pivot transfer to [facility] Toileting- Clothing Manipulation and Hygiene: Total assistance Toileting - Clothing Manipulation Details (indicate cue type and reason): pt states she has a toileting aid at home, needs total A at this time for pericare in standing     Functional mobility during ADLs: Minimal assistance;Rolling walker (2 wheels)      Extremity/Trunk Assessment Upper Extremity Assessment Upper Extremity Assessment: Generalized weakness (hx of shoulder surgery on R, reports RTC injury on L , limited shoulder ROM but [facility] for BADL/mobility tasks)   Lower Extremity Assessment Lower Extremity Assessment: Defer to PT evaluation        Vision   Vision Assessment?: No apparent visual deficits   Perception Perception Perception: Not tested   Praxis Praxis Praxis: Not tested  Cognition Arousal: Alert Behavior During Therapy: WFL for tasks assessed/performed Overall Cognitive Status: Within Functional Limits for tasks assessed                                          Exercises      Shoulder Instructions       General Comments VSS    Pertinent  Vitals/ Pain       Pain Assessment Pain Assessment: Faces Pain Score: 3  Faces Pain Scale: Hurts little more Pain Location: R knee and hip; pt attributes to OA pain Pain Descriptors / Indicators: Guarding, Grimacing, Discomfort Pain Intervention(s): Limited activity within patient's tolerance, Monitored during session, Repositioned  Home Living                                          Prior Functioning/Environment              Frequency  Min 1X/week        Progress Toward Goals  OT Goals(current goals can now be found in the care plan section)  Progress towards OT goals: Progressing toward goals  Acute Rehab OT Goals Patient Stated Goal: none stated OT Goal Formulation: With patient Time For Goal Achievement: 03/06/23 Potential to Achieve Goals: Good ADL Goals Pt Will Perform Lower Body Dressing: with contact guard assist;sitting/lateral leans;sit to/from stand Pt Will Transfer to Toilet: with contact guard assist;squat pivot transfer;stand pivot transfer;bedside commode Pt Will Perform Tub/Shower Transfer: Shower transfer;shower seat;Stand pivot transfer;Squat pivot transfer;with contact guard assist  Plan      Co-evaluation                 AM-PAC OT "6 Clicks" Daily Activity     Outcome Measure   Help from another person eating meals?: A Little Help from another person taking care of personal grooming?: A Little Help from another person toileting, which includes using toliet, bedpan, or urinal?: A Lot Help from another person bathing (including washing, rinsing, drying)?: A Lot Help from another person to put on and taking off regular upper body clothing?: A Little Help from another person to put on and taking off regular lower body clothing?: A Lot 6 Click Score: 15    End of Session Equipment Utilized During Treatment: Gait belt;Rolling walker (2 wheels)  OT Visit Diagnosis: Unsteadiness on feet (R26.81);Other abnormalities of gait  and mobility (R26.89);Muscle weakness (generalized) (M62.81)   Activity Tolerance Patient tolerated treatment well   Patient Left in bed;with call bell/phone within reach;with bed alarm set   Nurse Communication Mobility status        Time: 0102-7253 OT Time Calculation (min): 17 min  Charges: OT General Charges $OT Visit: 1 Visit OT Treatments $Self Care/Home Management : 8-22 mins  Carver Fila, OTD, OTR/L SecureChat Preferred Acute Rehab [phone]   Carver Fila Koonce 02/22/2023, 4:12 PM

## 2023-02-23 ENCOUNTER — Ambulatory Visit: Admission: RE | Admit: 2023-02-23 | Payer: 59 | Source: Home / Self Care | Admitting: Gastroenterology

## 2023-02-23 DIAGNOSIS — M86171 Other acute osteomyelitis, right ankle and foot: Secondary | ICD-10-CM | POA: Diagnosis not present

## 2023-02-23 LAB — CULTURE, BLOOD (ROUTINE X 2)
Culture: NO GROWTH
Culture: NO GROWTH
Special Requests: ADEQUATE

## 2023-02-23 SURGERY — COLONOSCOPY WITH PROPOFOL
Anesthesia: General

## 2023-02-23 NOTE — Progress Notes (Signed)
   02/23/23 1555  Mobility  Activity Transferred from chair to bed  Level of Assistance Contact guard assist, steadying assist  Assistive Device Front wheel walker  Distance Ambulated (ft) 3 ft  RLE Weight Bearing NWB  Mobility Referral Yes  $Mobility charge 1 Mobility  Mobility Specialist Start Time (ACUTE ONLY) 1540  Mobility Specialist Stop Time (ACUTE ONLY) 1555  Mobility Specialist Time Calculation (min) (ACUTE ONLY) 15 min   Mobility Specialist: Progress Note  Pt agreeable to mobility session - received in chair. Required CG using RW with hop-step gait. C/o R. Hip numbness d/t sitting in the chair. Returned to bed with all needs met - call bell within reach.   Barnie Mort, BS Mobility Specialist Please contact via SecureChat or Rehab office at [phone].

## 2023-02-23 NOTE — TOC Progression Note (Signed)
Transition of Care [facility]) - Progression Note    Patient Details  Name: [name] [name] MRN: [id number] Date of Birth: 22-Apr-1965  Transition of Care [facility]) CM/SW Contact  Marliss Coots, LCSW Phone Number: 02/23/2023, 9:49 AM  Clinical Narrative:     This CSW followed up with patient on SNF placement. Patient chose [facility] and requested transportation. CSW stated that they will start insurance authorization and set up PTAR. CSW also stated to patient that they will follow up closer to discharge date. Patient expressed understanding of the following information.  Expected Discharge Plan: Skilled Nursing Facility Barriers to Discharge: Insurance Authorization, SNF Pending bed offer, SNF Pending discharge orders, Continued Medical Work up  Expected Discharge Plan and Services In-house Referral: Clinical Social Work   Post Acute Care Choice: Skilled Nursing Facility Living arrangements for the past 2 months: Single Family Home                                       Social Determinants of Health (SDOH) Interventions SDOH Screenings   Food Insecurity: No Food Insecurity (02/18/2023)  Housing: Low Risk  (02/18/2023)  Transportation Needs: No Transportation Needs (02/18/2023)  Utilities: Not At Risk (02/18/2023)  Alcohol Screen: Low Risk  (08/21/2020)  Depression (PHQ2-9): High Risk (02/18/2023)  Financial Resource Strain: Low Risk  (07/27/2022)  Physical Activity: Inactive (07/26/2022)  Social Connections: Unknown (08/30/2021)   [facility]  Stress: No Stress Concern Present (11/19/2022)  Tobacco Use: Low Risk  (02/19/2023)  Health Literacy: Adequate Health Literacy (11/19/2022)    Readmission Risk Interventions    07/21/2022    2:43 PM 06/25/2022    1:44 PM  Readmission Risk Prevention Plan  Transportation Screening Complete Complete  PCP or Specialist Appt within 3-5 Days  Complete  HRI or Home Care Consult  Complete  Social Work Consult  for Recovery Care Planning/Counseling  --  Palliative Care Screening  Not Applicable  Medication Review [employer]) Complete Complete  HRI or Home Care Consult Complete   SW Recovery Care/Counseling Consult Complete   Palliative Care Screening Not Applicable   Skilled Nursing Facility Not Applicable

## 2023-02-23 NOTE — Consult Note (Signed)
[facility] Liaison Consult Note    02/23/2023  [name] [name] 1965-10-11 [id number]  Covering Charlesetta Shanks, RN , CM [facility] Liaison)  Primary Care Provider:  Lynnea Ferrier [facility])  Patient is currently active with Care Management for chronic disease management services.  Patient has been engaged by a  RNCM.  Our community based plan of care has focused on disease management and community resource support.   Patient will receive a post hospital call and will be evaluated for assessments and disease process education.   Plan: Pending discharge disposition for SNF level of care. If networking facility will collaborate with PAC-RN if not within the VBCI network for SNF facility. The facility will continue to address pt's needs.   Inpatient Transition Of Care [TOC] team member to make aware that Care Management following.  Of note, Care Management services does not replace or interfere with any services that are needed or arranged by inpatient [facility] care management team.   For additional questions or referrals please contact:  Elliot Cousin, RN, [facility] Liaison [place]   [facility] Population Health Office Hours MTWF  8:00 am-6:00 pm Direct Dial: [phone] mobile [phone] [Office toll free line] Office Hours are M-F 8:30 - 5 pm [email or web address]

## 2023-02-23 NOTE — Progress Notes (Signed)
Physical Therapy Treatment Patient Details Name: [name] [name] MRN: [id number] DOB: 06/26/1965 Today's Date: 02/23/2023   History of Present Illness 57 yo female presents to [facility] 11/1 with R foot osteomyelitis. S/p R TMA revision with I&D on 11/2. PMH includes  GERD, hypertension, asthma, prior TMA R foot, depression, OSA, bipolar disorder, obesity.    PT Comments  Pt received in supine, agreeable to therapy session and with good participation and tolerance for transfer training and functional mobility with knee scooter. Pt needing up to minA (+2 safety only) for stand pivot from bed<>knee scooter and <>chair from scooter. Pt mostly CGA to Supervision for scooting shorter household distance in hallway and needs reminders for use of brakes on scooter prior to transferring. Pt reporting moderate fatigue post-exertion and agreeable to sit up in recliner to rest. Pt agreeable to notify staff to contact mobility specialist when ready to return from chair>bed or [facility] later in the day. Pt continues to benefit from PT services to progress toward functional mobility goals.     If plan is discharge home, recommend the following: A little help with walking and/or transfers;A little help with bathing/dressing/bathroom   Can travel by private vehicle     No  Equipment Recommendations  None recommended by PT (pt has wc and knee scooter at home already)    Recommendations for Other Services       Precautions / Restrictions Precautions Precautions: Fall Precaution Comments: Contact precs Restrictions Weight Bearing Restrictions: Yes RLE Weight Bearing: Non weight bearing     Mobility  Bed Mobility Overal bed mobility: Needs Assistance Bed Mobility: Rolling, Sidelying to Sit Rolling: Modified independent (Device/Increase time), Used rails Sidelying to sit: Supervision, Used rails       General bed mobility comments: from flat bed and pt using R rail to sit up on R EOB; log roll technique per  pt preference and propping up on her elbow.    Transfers Overall transfer level: Needs assistance Equipment used: Rolling walker (2 wheels) Transfers: Sit to/from Stand, Bed to chair/wheelchair/BSC Sit to Stand: Contact guard assist, Min assist Stand pivot transfers: Min assist         General transfer comment: Pt needs reminders for use of brakes with transfers; stood from bed with BUE on knee scooter handles and then able to place R knee on knee scooter, PTA assist to stabilize scooter as well and to assist pt to adjust cushion under her knee while standing. Pt defers using shoe on LLE and keeps hospital socks on; of note, PTA adjusted knee scooter cushion to lower height prior to transfer which pt reports was helpful.    Ambulation/Gait Ambulation/Gait assistance: Contact guard assist Gait Distance (Feet): 100 Feet Assistive device: Knee scooter         General Gait Details: PTA CGA transitioning to supervision and cuing for form/safety, more CGA when turning/backing up but no overt LOB after initial few feet.   Stairs             Wheelchair Mobility     Tilt Bed    Modified Rankin (Stroke Patients Only)       Balance Overall balance assessment: Needs assistance Sitting-balance support: No upper extremity supported, Feet supported Sitting balance-Leahy Scale: Fair     Standing balance support: Bilateral upper extremity supported, During functional activity, Reliant on assistive device for balance Standing balance-Leahy Scale: Poor Standing balance comment: reliant on BUE support given WB restrictions  Cognition Arousal: Alert Behavior During Therapy: WFL for tasks assessed/performed Overall Cognitive Status: Within Functional Limits for tasks assessed                                          Exercises      General Comments General comments (skin integrity, edema, etc.): dried drainage on pt bed  linens so linens removed from bed after pt got up to chair; no visible drainage seen on pt RLE surigcal site which was ace wrapped and c/d/i      Pertinent Vitals/Pain Pain Assessment Pain Assessment: Faces Faces Pain Scale: Hurts a little bit Pain Location: R knee and hip; pt attributes to OA pain Pain Descriptors / Indicators: Discomfort Pain Intervention(s): Limited activity within patient's tolerance, Monitored during session, Repositioned (pt defers need for pain meds)     PT Goals (current goals can now be found in the care plan section) Acute Rehab PT Goals Patient Stated Goal: home PT Goal Formulation: With patient Time For Goal Achievement: 03/06/23 Progress towards PT goals: Progressing toward goals    Frequency    Min 1X/week      PT Plan         AM-PAC PT "6 Clicks" Mobility   Outcome Measure  Help needed turning from your back to your side while in a flat bed without using bedrails?: None Help needed moving from lying on your back to sitting on the side of a flat bed without using bedrails?: A Little Help needed moving to and from a bed to a chair (including a wheelchair)?: A Little Help needed standing up from a chair using your arms (e.g., wheelchair or bedside chair)?: A Little Help needed to walk in hospital room?: A Little (CGA for knee scooter, cannot hop household distances with RW) Help needed climbing 3-5 steps with a railing? : Total 6 Click Score: 17    End of Session Equipment Utilized During Treatment: Gait belt Activity Tolerance: Patient tolerated treatment well Patient left: in chair;with call bell/phone within reach (pt agreeable to use call bell for any OOB mobility needs, RN aware of pt position in chair) Nurse Communication: Mobility status;Other (comment) (NT notified to tell mobility specialist when she is ready to get back to bed, pt also agreeable to notify staff) PT Visit Diagnosis: Other abnormalities of gait and mobility  (R26.89);Muscle weakness (generalized) (M62.81)     Time: 1212-1239 PT Time Calculation (min) (ACUTE ONLY): 27 min  Charges:    $Gait Training: 8-22 mins $Therapeutic Activity: 8-22 mins PT General Charges $$ ACUTE PT VISIT: 1 Visit                     Sriyan Cutting P., PTA Acute Rehabilitation Services Secure Chat Preferred 9a-5:30pm Office: [phone]    Dorathy Kinsman [facility] 02/23/2023, 1:02 PM

## 2023-02-23 NOTE — Plan of Care (Signed)

## 2023-02-23 NOTE — Hospital Course (Signed)
Ms. Kraner is a 57 y.o. female with PMH HTN, bipolar disorder, OSA, CKD stage II, chronic dCHF, chronic pain, asthma, anxiety and depression, GERD, prediabetes and s/p right transmetatarsal amputation who presented to the ED due to concern for osteomyelitis of the right foot.   Patient reports that she had an infection on her right lower extremity stump 2 months ago that progressed from an ulcer to an open draining wound.  She has been going to the wound care center for wound care.  During her last evaluation on 10/29, there was a concern for progression to deep bone infection so an MRI was obtained on 10/30.   MRI results showed signs of acute osteomyelitis and she was advised to come to the ED for IV antibiotics and further management of her right foot infection.  She was evaluated by podiatry and underwent revision of TMA with I&D on 02/19/2023.

## 2023-02-23 NOTE — Progress Notes (Signed)
Progress Note    [name] [name]   WJX:[id number]  DOB: Sep 08, 1965  DOA: 02/18/2023     5 PCP: [name] Brooks, MD  Initial CC: RLE wound  Hospital Course: [name] is a 57 y.o. female with PMH HTN, bipolar disorder, OSA, CKD stage II, chronic dCHF, chronic pain, asthma, anxiety and depression, GERD, prediabetes and s/p right transmetatarsal amputation who presented to the ED due to concern for osteomyelitis of the right foot.   Patient reports that she had an infection on her right lower extremity stump 2 months ago that progressed from an ulcer to an open draining wound.  She has been going to the wound care center for wound care.  During her last evaluation on 10/29, there was a concern for progression to deep bone infection so an MRI was obtained on 10/30.   MRI results showed signs of acute osteomyelitis and she was advised to come to the ED for IV antibiotics and further management of her right foot infection.  She was evaluated by podiatry and underwent revision of TMA with I&D on 02/19/2023.  Interval History:  No events overnight.  Resting in bed when seen this morning.  Understands tentative plan is for going to rehab at discharge.   Assessment and Plan:  Osteomyelitis of the right foot Prediabetic patient with a history of peripheral neuropathy and right transmetatarsal amputation 3 years ago with recent right foot wound found to have evidence of osteomyelitis, 1.0 x 2.3 x 4.7 cm abscess and infectious myositis on MRI.  - s/p revision and I&D with podiatry on 11/2 - continue bactrim to complete 2 week course  - d/c to SNF once insurance auth obtained    HTN Chronic diastolic HF No evidence of heart failure exacerbation on exam.  BP stable -Continue metoprolol    Chronic pain -Resume pain meds, Lyrica and muscle relaxer    GERD -Resume famotidine and Protonix   Bipolar disorder Anxiety and depression -Resume all psych meds   Class III  obesity Prediabetes Body mass index is 41.02 kg/m.  Last A1c 5.4% 7 months ago. -Nutrition counseling   Difficulty urinating -check bladder scan   Constipation -bowel regimen   Old records reviewed in assessment of this patient  Antimicrobials: Zosyn 02/18/2023 >> 02/21/2023 Vancomycin 02/18/2023 >> 02/20/2023 Bactrim 02/21/2023 >> current  DVT prophylaxis:  Lovenox   Code Status:   Code Status: Full Code  Mobility Assessment (Last 72 Hours)     Mobility Assessment     Row Name 02/23/23 1200 02/23/23 0800 02/22/23 2005 02/22/23 1600 02/22/23 0900   Does patient have an order for bedrest or is patient medically unstable No - Continue assessment No - Continue assessment No - Continue assessment -- No - Continue assessment   What is the highest level of mobility based on the progressive mobility assessment? Level 3 (Stands with assist) - Balance while standing  and cannot march in place Level 3 (Stands with assist) - Balance while standing  and cannot march in place Level 3 (Stands with assist) - Balance while standing  and cannot march in place Level 3 (Stands with assist) - Balance while standing  and cannot march in place Level 3 (Stands with assist) - Balance while standing  and cannot march in place   Is the above level different from baseline mobility prior to current illness? Yes - Recommend PT order Yes - Recommend PT order Yes - Recommend PT order -- Yes - Recommend PT order  Row Name 02/22/23 1610 02/21/23 21:46:15 02/21/23 1600 02/21/23 1500 02/21/23 1200   Does patient have an order for bedrest or is patient medically unstable No - Continue assessment No - Continue assessment No - Continue assessment -- No - Continue assessment   What is the highest level of mobility based on the progressive mobility assessment? Level 3 (Stands with assist) - Balance while standing  and cannot march in place Level 3 (Stands with assist) - Balance while standing  and cannot march in place  Level 2 (Chairfast) - Balance while sitting on edge of bed and cannot stand Level 3 (Stands with assist) - Balance while standing  and cannot march in place Level 2 (Chairfast) - Balance while sitting on edge of bed and cannot stand   Is the above level different from baseline mobility prior to current illness? Yes - Recommend PT order -- Yes - Recommend PT order -- Yes - Recommend PT order    Row Name 02/21/23 0800 02/20/23 2200 02/20/23 1540       Does patient have an order for bedrest or is patient medically unstable No - Continue assessment No - Continue assessment --     What is the highest level of mobility based on the progressive mobility assessment? Level 2 (Chairfast) - Balance while sitting on edge of bed and cannot stand Level 2 (Chairfast) - Balance while sitting on edge of bed and cannot stand Level 4 (Walks with assist in room) - Balance while marching in place and cannot step forward and back - Complete     Is the above level different from baseline mobility prior to current illness? Yes - Recommend PT order Yes - Recommend PT order --              Barriers to discharge: none Disposition Plan:  SNF Status is: Inpt  Objective: Blood pressure 97/60, pulse (!) 53, temperature 98.1 F (36.7 C), resp. rate 17, height 5' 4.02" (1.626 m), weight 108.4 kg, SpO2 95%.  Examination:  Physical Exam   Consultants:  Podiatry  Procedures:  02/19/2023: Right revision transmetatarsal amputation with irrigation and debridement   Data Reviewed: No results found. However, due to the size of the patient record, not all encounters were searched. Please check Results Review for a complete set of results.  I have reviewed pertinent nursing notes, vitals, labs, and images as necessary. I have ordered labwork to follow up on as indicated.  I have reviewed the last notes from staff over past 24 hours. I have discussed patient's care plan and test results with nursing staff, CM/SW, and other  staff as appropriate.  Time spent: Greater than 50% of the 55 minute visit was spent in counseling/coordination of care for the patient as laid out in the A&P.   LOS: 5 days   Lewie Chamber, MD [facility] 02/23/2023, 2:41 PM

## 2023-02-24 DIAGNOSIS — M869 Osteomyelitis, unspecified: Secondary | ICD-10-CM | POA: Diagnosis not present

## 2023-02-24 DIAGNOSIS — M6281 Muscle weakness (generalized): Secondary | ICD-10-CM | POA: Diagnosis not present

## 2023-02-24 DIAGNOSIS — I10 Essential (primary) hypertension: Secondary | ICD-10-CM | POA: Diagnosis not present

## 2023-02-24 DIAGNOSIS — D509 Iron deficiency anemia, unspecified: Secondary | ICD-10-CM | POA: Diagnosis not present

## 2023-02-24 DIAGNOSIS — M25561 Pain in right knee: Secondary | ICD-10-CM | POA: Diagnosis not present

## 2023-02-24 DIAGNOSIS — M961 Postlaminectomy syndrome, not elsewhere classified: Secondary | ICD-10-CM | POA: Diagnosis not present

## 2023-02-24 DIAGNOSIS — R262 Difficulty in walking, not elsewhere classified: Secondary | ICD-10-CM | POA: Diagnosis not present

## 2023-02-24 DIAGNOSIS — J4520 Mild intermittent asthma, uncomplicated: Secondary | ICD-10-CM | POA: Diagnosis not present

## 2023-02-24 DIAGNOSIS — D500 Iron deficiency anemia secondary to blood loss (chronic): Secondary | ICD-10-CM | POA: Diagnosis not present

## 2023-02-24 DIAGNOSIS — G4700 Insomnia, unspecified: Secondary | ICD-10-CM | POA: Diagnosis not present

## 2023-02-24 DIAGNOSIS — D649 Anemia, unspecified: Secondary | ICD-10-CM | POA: Diagnosis not present

## 2023-02-24 DIAGNOSIS — T8131XA Disruption of external operation (surgical) wound, not elsewhere classified, initial encounter: Secondary | ICD-10-CM | POA: Diagnosis not present

## 2023-02-24 DIAGNOSIS — G629 Polyneuropathy, unspecified: Secondary | ICD-10-CM | POA: Diagnosis not present

## 2023-02-24 DIAGNOSIS — I5032 Chronic diastolic (congestive) heart failure: Secondary | ICD-10-CM | POA: Diagnosis not present

## 2023-02-24 DIAGNOSIS — Z89431 Acquired absence of right foot: Secondary | ICD-10-CM | POA: Diagnosis not present

## 2023-02-24 DIAGNOSIS — Z23 Encounter for immunization: Secondary | ICD-10-CM | POA: Diagnosis not present

## 2023-02-24 DIAGNOSIS — M86171 Other acute osteomyelitis, right ankle and foot: Secondary | ICD-10-CM | POA: Diagnosis not present

## 2023-02-24 DIAGNOSIS — K219 Gastro-esophageal reflux disease without esophagitis: Secondary | ICD-10-CM | POA: Diagnosis not present

## 2023-02-24 DIAGNOSIS — K5900 Constipation, unspecified: Secondary | ICD-10-CM | POA: Diagnosis not present

## 2023-02-24 DIAGNOSIS — R110 Nausea: Secondary | ICD-10-CM | POA: Diagnosis not present

## 2023-02-24 DIAGNOSIS — Z889 Allergy status to unspecified drugs, medicaments and biological substances status: Secondary | ICD-10-CM | POA: Diagnosis not present

## 2023-02-24 DIAGNOSIS — Z4781 Encounter for orthopedic aftercare following surgical amputation: Secondary | ICD-10-CM | POA: Diagnosis not present

## 2023-02-24 DIAGNOSIS — R638 Other symptoms and signs concerning food and fluid intake: Secondary | ICD-10-CM | POA: Diagnosis not present

## 2023-02-24 DIAGNOSIS — E8809 Other disorders of plasma-protein metabolism, not elsewhere classified: Secondary | ICD-10-CM | POA: Diagnosis not present

## 2023-02-24 DIAGNOSIS — R7303 Prediabetes: Secondary | ICD-10-CM | POA: Diagnosis not present

## 2023-02-24 DIAGNOSIS — R531 Weakness: Secondary | ICD-10-CM | POA: Diagnosis not present

## 2023-02-24 DIAGNOSIS — Y835 Amputation of limb(s) as the cause of abnormal reaction of the patient, or of later complication, without mention of misadventure at the time of the procedure: Secondary | ICD-10-CM | POA: Diagnosis not present

## 2023-02-24 DIAGNOSIS — M25552 Pain in left hip: Secondary | ICD-10-CM | POA: Diagnosis not present

## 2023-02-24 DIAGNOSIS — M545 Low back pain, unspecified: Secondary | ICD-10-CM | POA: Diagnosis not present

## 2023-02-24 DIAGNOSIS — S51811A Laceration without foreign body of right forearm, initial encounter: Secondary | ICD-10-CM | POA: Diagnosis not present

## 2023-02-24 DIAGNOSIS — D631 Anemia in chronic kidney disease: Secondary | ICD-10-CM | POA: Diagnosis not present

## 2023-02-24 DIAGNOSIS — M25511 Pain in right shoulder: Secondary | ICD-10-CM | POA: Diagnosis not present

## 2023-02-24 DIAGNOSIS — G894 Chronic pain syndrome: Secondary | ICD-10-CM | POA: Diagnosis not present

## 2023-02-24 DIAGNOSIS — G4733 Obstructive sleep apnea (adult) (pediatric): Secondary | ICD-10-CM | POA: Diagnosis not present

## 2023-02-24 DIAGNOSIS — J302 Other seasonal allergic rhinitis: Secondary | ICD-10-CM | POA: Diagnosis not present

## 2023-02-24 DIAGNOSIS — Z7401 Bed confinement status: Secondary | ICD-10-CM | POA: Diagnosis not present

## 2023-02-24 DIAGNOSIS — N182 Chronic kidney disease, stage 2 (mild): Secondary | ICD-10-CM | POA: Diagnosis not present

## 2023-02-24 DIAGNOSIS — M150 Primary generalized (osteo)arthritis: Secondary | ICD-10-CM | POA: Diagnosis not present

## 2023-02-24 DIAGNOSIS — G43E19 Chronic migraine with aura, intractable, without status migrainosus: Secondary | ICD-10-CM | POA: Diagnosis not present

## 2023-02-24 LAB — AEROBIC/ANAEROBIC CULTURE W GRAM STAIN (SURGICAL/DEEP WOUND)
Culture: NO GROWTH
Culture: NO GROWTH
Gram Stain: NONE SEEN

## 2023-02-24 MED ORDER — SENNOSIDES-DOCUSATE SODIUM 8.6-50 MG PO TABS: 1.0000 | ORAL_TABLET | Freq: Every evening | ORAL | Status: DC | PRN

## 2023-02-24 MED ORDER — OXYCODONE-ACETAMINOPHEN 10-325 MG PO TABS: 1.0000 | ORAL_TABLET | Freq: Four times a day (QID) | ORAL | 0 refills | Status: AC | PRN

## 2023-02-24 MED ORDER — SULFAMETHOXAZOLE-TRIMETHOPRIM 800-160 MG PO TABS: 1.0000 | ORAL_TABLET | Freq: Two times a day (BID) | ORAL | Status: DC

## 2023-02-24 MED ORDER — POLYETHYLENE GLYCOL 3350 17 G PO PACK: 17.0000 g | PACK | Freq: Every day | ORAL | Status: AC

## 2023-02-24 MED ORDER — PREGABALIN 100 MG PO CAPS: 100.0000 mg | ORAL_CAPSULE | Freq: Three times a day (TID) | ORAL | 0 refills | Status: AC

## 2023-02-24 NOTE — Progress Notes (Signed)
Pt to be discharge to [facility]. Reviewed AVS, informed pt of changes to her medications and that she had pending prescriptions to be picked up from her preferred pharmacy. Made pt aware of follow up appointments. Answered any pending questions. Pt had no further questions. Removed PIV which was CDI and free from s/sx of infection. Assisted pt in gathering her belongings. Assisted pt into gurney where she was escorted by EM to the front entrance where transport awaits to take her [facility]. Pt discharged in stable condition.

## 2023-02-24 NOTE — TOC Progression Note (Addendum)
Transition of Care [facility]) - Progression Note    Patient Details  Name: [name] [name] MRN: [id number] Date of Birth: 08-22-1965  [facility]) CM/SW Contact  Marliss Coots, LCSW Phone Number: 02/24/2023, 10:32 AM  Clinical Narrative:     This CSW informed patient at bedside that insurance authorization was approved for SNF and that they will begin setting up transportation for discharge. Patient displayed understanding of the following information. Patient inquired about medication. This CSW relayed to patient that medication orders will be sent to SNF, where they will provider her with her medication. Patient displayed understanding of this information and stated that she did not have any prescriptions in her possession. This CSW stated that in addition to PTAR she will follow up with colleague on medication inquiry and the hospitalist on a dc date.   CSW returned to patient bedside to inquire about sleep apnea. Patient reported that she does not use a BiPap or CPAP, nor is in possession of either equipment.   CSW returned to confirm discharge to patient at bedside.  [facility] Bed [phone] report # [id number]   Expected Discharge Plan: Skilled Nursing Facility Barriers to Discharge: SNF Pending discharge summary, SNF Pending discharge orders, Continued Medical Work up  Expected Discharge Plan and Services In-house Referral: Clinical Social Work   Post Acute Care Choice: Skilled Nursing Facility Living arrangements for the past 2 months: Single Family Home                                       Social Determinants of Health (SDOH) Interventions SDOH Screenings   Food Insecurity: No Food Insecurity (02/18/2023)  Housing: Low Risk  (02/18/2023)  Transportation Needs: No Transportation Needs (02/18/2023)  Utilities: Not At Risk (02/18/2023)  Alcohol Screen: Low Risk  (08/21/2020)  Depression (PHQ2-9): High Risk (02/18/2023)  Financial Resource Strain: Low  Risk  (07/27/2022)  Physical Activity: Inactive (07/26/2022)  Social Connections: Unknown (08/30/2021)   Received from [facility], [facility]  Stress: No Stress Concern Present (11/19/2022)  Tobacco Use: Low Risk  (02/19/2023)  Health Literacy: Adequate Health Literacy (11/19/2022)    Readmission Risk Interventions    07/21/2022    2:43 PM 06/25/2022    1:44 PM  Readmission Risk Prevention Plan  Transportation Screening Complete Complete  PCP or Specialist Appt within 3-5 Days  Complete  HRI or Home Care Consult  Complete  Social Work Consult for Recovery Care Planning/Counseling  --  Palliative Care Screening  Not Applicable  Medication Review Oceanographer) Complete Complete  HRI or Home Care Consult Complete   SW Recovery Care/Counseling Consult Complete   Palliative Care Screening Not Applicable   Skilled Nursing Facility Not Applicable

## 2023-02-24 NOTE — TOC Transition Note (Signed)
Transition of Care [facility]) - CM/SW Discharge Note   Patient Details  Name: [name] [name] MRN: [id number] Date of Birth: 06/19/1965  Transition of Care [facility]) CM/SW Contact:  Marliss Coots, LCSW Phone Number: 02/24/2023, 2:02 PM   Clinical Narrative:     Patient will DC to: [facility] Anticipated DC date: 02/24/2023 Family notified: By patient Daivd Council; Mother) Transport by: Sharin Mons   Per MD patient ready for DC to [facility]. RN to call report prior to discharge [phone]). RN, patient, patient's family, and facility notified of DC. Discharge Summary and FL2 sent to facility. DC packet on chart. Ambulance transport requested for patient.   CSW will sign off for now as social work intervention is no longer needed. Please consult [place] again if new needs arise.   Final next level of care: Skilled Nursing Facility Barriers to Discharge: No Barriers Identified   Patient Goals and CMS Choice CMS Medicare.gov Compare Post Acute Care list provided to:: Patient Choice offered to / list presented to : Patient  Discharge Placement PASRR number recieved: 02/22/23 PASRR number recieved: 02/22/23            Patient chooses bed at: [facility] Patient to be transferred to facility by: PTAR Name of family member notified: [place]; Mother Patient and family notified of of transfer: 02/23/23  Discharge Plan and Services Additional resources added to the After Visit Summary for   In-house Referral: Clinical Social Work   Post Acute Care Choice: Skilled Nursing Facility                               Social Determinants of Health (SDOH) Interventions SDOH Screenings   Food Insecurity: No Food Insecurity (02/18/2023)  Housing: Low Risk  (02/18/2023)  Transportation Needs: No Transportation Needs (02/18/2023)  Utilities: Not At Risk (02/18/2023)  Alcohol Screen: Low Risk  (08/21/2020)  Depression (PHQ2-9): High Risk (02/18/2023)   Financial Resource Strain: Low Risk  (07/27/2022)  Physical Activity: Inactive (07/26/2022)  Social Connections: Unknown (08/30/2021)   Received from [facility], [facility]  Stress: No Stress Concern Present (11/19/2022)  Tobacco Use: Low Risk  (02/19/2023)  Health Literacy: Adequate Health Literacy (11/19/2022)     Readmission Risk Interventions    07/21/2022    2:43 PM 06/25/2022    1:44 PM  Readmission Risk Prevention Plan  Transportation Screening Complete Complete  PCP or Specialist Appt within 3-5 Days  Complete  HRI or Home Care Consult  Complete  Social Work Consult for Recovery Care Planning/Counseling  --  Palliative Care Screening  Not Applicable  Medication Review Oceanographer) Complete Complete  HRI or Home Care Consult Complete   SW Recovery Care/Counseling Consult Complete   Palliative Care Screening Not Applicable   Skilled Nursing Facility Not Applicable

## 2023-02-24 NOTE — Discharge Summary (Signed)
Physician Discharge Summary   [name] [name] ZOX:[id number] DOB: [birth year] DOA: 02/18/2023  PCP: Donita Brooks, MD  Admit date: 02/18/2023 Discharge date:  02/24/2023   Admitted From: Home Disposition:  [facility] Discharging physician: Lewie Chamber, MD Barriers to discharge: none  Recommendations at discharge: Follow up with podiatry on 11/13  Discharge Condition: stable CODE STATUS: Full Diet recommendation:  Diet Orders (From admission, onward)     Start     Ordered   02/24/23 0000  Diet general        02/24/23 1138   02/20/23 1105  Diet Carb Modified Fluid consistency: Thin  Diet effective now       Question Answer Comment  Calorie Level Medium 1600-2000   Fluid consistency: Thin      02/20/23 1104            Hospital Course: [name] [name] is a 57 y.o. female with PMH HTN, bipolar disorder, OSA, CKD stage II, chronic dCHF, chronic pain, asthma, anxiety and depression, GERD, prediabetes and s/p right transmetatarsal amputation who presented to the ED due to concern for osteomyelitis of the right foot.   Patient reports that she had an infection on her right lower extremity stump 2 months ago that progressed from an ulcer to an open draining wound.  She has been going to the wound care center for wound care.  During her last evaluation on 10/29, there was a concern for progression to deep bone infection so an MRI was obtained on 10/30.   MRI results showed signs of acute osteomyelitis and she was advised to come to the ED for IV antibiotics and further management of her right foot infection.  She was evaluated by podiatry and underwent revision of TMA with I&D on 02/19/2023.  Assessment and Plan:  Osteomyelitis of the right foot Prediabetic patient with a history of peripheral neuropathy and right transmetatarsal amputation 3 years ago with recent right foot wound found to have evidence of osteomyelitis, 1.0 x 2.3 x 4.7 cm abscess and infectious myositis on  MRI.  - s/p revision and I&D with podiatry on 11/2 - continue bactrim to complete 2 week course  - d/c to SNF   HTN Chronic diastolic HF No evidence of heart failure exacerbation on exam.  BP stable -Continue metoprolol    Chronic pain -Resume pain meds, Lyrica and muscle relaxer    GERD -continue home regimen    Bipolar disorder Anxiety and depression -Resume all psych meds   Class III obesity Prediabetes Body mass index is 41.02 kg/m.  Last A1c 5.4% 7 months ago. -Nutrition counseling   Difficulty urinating - resolved  - voiding well now   Constipation -bowel regimen    The patient's acute and chronic medical conditions were treated accordingly. On day of discharge, patient was felt deemed stable for discharge. Patient/family member advised to call PCP or come back to ER if needed.   Principal Diagnosis: Osteomyelitis of right foot [facility])  Discharge Diagnoses: Active Hospital Problems   Diagnosis Date Noted   Osteomyelitis of right foot [facility]) 02/18/2023    Resolved Hospital Problems  No resolved problems to display.     Discharge Instructions     Diet general   Complete by: As directed    Increase activity slowly   Complete by: As directed       Allergies as of 02/24/2023       Reactions   Tetracyclines & Related Other (See Comments)   Syncope and put  her "in a coma"   Tramadol Other (See Comments)   Seizures   Oxybutynin Other (See Comments)   Unknown reaction per Mom   Phenazopyridine Other (See Comments)   Unknown reaction   Ciprofloxacin Rash, Itching   Codeine Itching, Rash   Estradiol Rash   Patches broke out the skin        Medication List     STOP taking these medications    DULoxetine 60 MG capsule Commonly known as: CYMBALTA   naloxone 4 MG/0.1ML Liqd nasal spray kit Commonly known as: NARCAN       TAKE these medications    Aimovig 70 MG/ML Soaj Generic drug: Erenumab-aooe administer 1mL UNDER THE SKIN every 30  DAYS   busPIRone 15 MG tablet Commonly known as: BUSPAR Take 15 mg by mouth 3 (three) times daily.   CALCIUM 1200 PO Take 1,200 mg by mouth daily.   escitalopram 10 MG tablet Commonly known as: LEXAPRO Take 10 mg by mouth daily.   estradiol 1 MG tablet Commonly known as: ESTRACE TAKE ONE TABLET BY MOUTH EVERYDAY AT BEDTIME What changed: See the new instructions.   famotidine 40 MG tablet Commonly known as: PEPCID TAKE 1 TABLET BY MOUTH EVERY DAY AT BEDTIME   FeroSul 325 (65 Fe) MG tablet Generic drug: ferrous sulfate TAKE 1 TABLET BY MOUTH AT BREAKFAST AND AT BEDTIME What changed: See the new instructions.   haloperidol 5 MG tablet Commonly known as: HALDOL Take 5 mg by mouth at bedtime.   lamoTRIgine 100 MG tablet Commonly known as: LAMICTAL Take 100 mg by mouth daily.   levocetirizine 5 MG tablet Commonly known as: XYZAL TAKE 1 TABLET BY MOUTH EVERY EVENING   methocarbamol 500 MG tablet Commonly known as: ROBAXIN Take 500 mg by mouth every 8 (eight) hours as needed.   metoprolol succinate 25 MG 24 hr tablet Commonly known as: TOPROL-XL Take 25 mg by mouth daily.   Minipress 2 MG capsule Generic drug: prazosin Take 2 mg by mouth at bedtime.   multivitamin with minerals tablet Take 1 tablet by mouth daily.   oxyCODONE-acetaminophen 10-325 MG tablet Commonly known as: PERCOCET Take 1 tablet by mouth every 6 (six) hours as needed for pain. Take one tablet by mouth up to 4 times daily as needed. What changed:  how much to take how to take this when to take this reasons to take this   pantoprazole 40 MG tablet Commonly known as: PROTONIX TAKE ONE (1) TABLET BY MOUTH TWICE DAILY What changed: See the new instructions.   polyethylene glycol 17 g packet Commonly known as: MIRALAX / GLYCOLAX Take 17 g by mouth daily. Start taking on: February 25, 2023   pregabalin 100 MG capsule Commonly known as: LYRICA Take 1 capsule (100 mg total) by mouth 3  (three) times daily.   senna-docusate 8.6-50 MG tablet Commonly known as: Senokot-S Take 1 tablet by mouth at bedtime as needed for mild constipation.   solifenacin 10 MG tablet Commonly known as: VESICARE TAKE 1 TABLET BY MOUTH ONCE DAILY   sulfamethoxazole-trimethoprim 800-160 MG tablet Commonly known as: BACTRIM DS Take 1 tablet by mouth every 12 (twelve) hours for 22 doses. What changed: when to take this   SUMAtriptan 50 MG tablet Commonly known as: IMITREX Take 50 mg by mouth every 2 (two) hours as needed for migraine.   topiramate 50 MG tablet Commonly known as: TOPAMAX TAKE ONE (1) TABLET BY MOUTH TWICE DAILY What changed: See the new  instructions.   traZODone 100 MG tablet Commonly known as: DESYREL Take 1 tablet (100 mg total) by mouth at bedtime. What changed:  how much to take additional instructions        Allergies  Allergen Reactions   Tetracyclines & Related Other (See Comments)    Syncope and put her "in a coma"   Tramadol Other (See Comments)    Seizures   Oxybutynin Other (See Comments)    Unknown reaction per Mom   Phenazopyridine Other (See Comments)    Unknown reaction   Ciprofloxacin Rash and Itching   Codeine Itching and Rash   Estradiol Rash    Patches broke out the skin    Consultations: Podiatry  Procedures: 02/19/2023: Right revision transmetatarsal amputation with irrigation and debridement   Discharge Exam: BP 113/77   Pulse (!) 53   Temp 97.8 F (36.6 C)   Resp 16   Ht 5' 4.02" (1.626 m)   Wt 108.4 kg   SpO2 96%   BMI 41.00 kg/m  Physical Exam Constitutional:      General: She is not in acute distress.    Appearance: Normal appearance.  HENT:     Head: Normocephalic and atraumatic.     Mouth/Throat:     Mouth: Mucous membranes are moist.  Eyes:     Extraocular Movements: Extraocular movements intact.  Cardiovascular:     Rate and Rhythm: Normal rate and regular rhythm.  Pulmonary:     Effort: Pulmonary  effort is normal. No respiratory distress.     Breath sounds: Normal breath sounds. No wheezing.  Abdominal:     General: Bowel sounds are normal. There is no distension.     Palpations: Abdomen is soft.     Tenderness: There is no abdominal tenderness.  Musculoskeletal:        General: No swelling.     Cervical back: Normal range of motion and neck supple.  Skin:    General: Skin is warm and dry.  Neurological:     General: No focal deficit present.     Mental Status: She is alert.  Psychiatric:        Mood and Affect: Mood normal.      The results of significant diagnostics from this hospitalization (including imaging, microbiology, ancillary and laboratory) are listed below for reference.   Microbiology: Recent Results (from the past 240 hour(s))  Blood culture (routine x 2)     Status: None   Collection Time: 02/18/23  3:45 PM   Specimen: BLOOD  Result Value Ref Range Status   Specimen Description BLOOD SITE NOT SPECIFIED  Final   Special Requests   Final    BOTTLES DRAWN AEROBIC AND ANAEROBIC Blood Culture results may not be optimal due to an excessive volume of blood received in culture bottles   Culture   Final    NO GROWTH 5 DAYS Performed at [facility], [address], [place], [place] [address]    Report Status 02/23/2023 FINAL  Final  Blood culture (routine x 2)     Status: None   Collection Time: 02/18/23  3:45 PM   Specimen: BLOOD  Result Value Ref Range Status   Specimen Description BLOOD SITE NOT SPECIFIED  Final   Special Requests   Final    BOTTLES DRAWN AEROBIC AND ANAEROBIC Blood Culture adequate volume   Culture   Final    NO GROWTH 5 DAYS Performed at [facility], [address],  [place], [place] [address]    Report Status 02/23/2023 FINAL  Final  Surgical pcr screen     Status: Abnormal   Collection Time: 02/19/23 10:26 AM   Specimen: Nasal Mucosa; Nasal Swab  Result Value Ref Range Status   MRSA, PCR NEGATIVE NEGATIVE Final    Staphylococcus aureus POSITIVE (A) NEGATIVE Final    Comment: (NOTE) The Xpert SA Assay (FDA approved for NASAL specimens in patients 7 years of age and older), is one component of a comprehensive surveillance program. It is not intended to diagnose infection nor to guide or monitor treatment. Performed at [facility], [address], [place], [place] [address]   Aerobic/Anaerobic Culture w Gram Stain (surgical/deep wound)     Status: None   Collection Time: 02/19/23 12:12 PM   Specimen: Bone; Tissue  Result Value Ref Range Status   Specimen Description BONE  Final   Special Requests INFECTED RIGHT BONE  Final   Gram Stain RARE WBC SEEN RARE GRAM NEGATIVE RODS   Final   Culture   Final    No growth aerobically or anaerobically. Performed at [facility], [address], [place], [place] [address]    Report Status 02/24/2023 FINAL  Final  Aerobic/Anaerobic Culture w Gram Stain (surgical/deep wound)     Status: None   Collection Time: 02/19/23 12:13 PM   Specimen: Bone; Tissue  Result Value Ref Range Status   Specimen Description BONE  Final   Special Requests RIGHT FOOT RESIDUAL BONE METATARSAL  Final   Gram Stain NO WBC SEEN NO ORGANISMS SEEN   Final   Culture   Final    No growth aerobically or anaerobically. Performed at [facility], [address], [place], [place] [address]    Report Status 02/24/2023 FINAL  Final     Labs: BNP (last 3 results) Recent Labs    06/18/22 1706 06/24/22 1219  BNP 259* 247.5*   Basic Metabolic Panel: Recent Labs  Lab 02/18/23 1353 02/19/23 0419 02/20/23 0423 02/21/23 0709 02/22/23 0621  NA 137 133* 138 137 138  K 3.8 3.3* 3.1* 3.6 3.5  CL 106 104 107 108 109  CO2 20* 20* 19* 21* 22  GLUCOSE 113* 117* 115* 115* 92  BUN 11 8 9 8 9   CREATININE 0.84 0.93 1.07* 1.14* 0.89  CALCIUM 8.9 8.4* 8.3* 8.6* 8.5*   Liver Function Tests: Recent Labs  Lab 02/18/23 1353  AST 10*  ALT 10  ALKPHOS 72  BILITOT 0.3   PROT 7.0  ALBUMIN 3.2*   No results for input(s): "LIPASE", "AMYLASE" in the last 168 hours. No results for input(s): "AMMONIA" in the last 168 hours. CBC: Recent Labs  Lab 02/18/23 1353 02/19/23 0419 02/20/23 0423 02/21/23 0709 02/22/23 0621  WBC 8.3 8.5 7.7 6.2 4.4  NEUTROABS 6.7  --   --   --   --   HGB 9.5* 8.7* 7.7* 7.8* 7.4*  HCT 29.6* 26.4* 23.7* 24.1* 22.7*  MCV 98.7 97.1 97.1 96.8 97.4  PLT 228 207 209 235 247   Cardiac Enzymes: No results for input(s): "CKTOTAL", "CKMB", "CKMBINDEX", "TROPONINI" in the last 168 hours. BNP: Invalid input(s): "POCBNP" CBG: No results for input(s): "GLUCAP" in the last 168 hours. D-Dimer No results for input(s): "DDIMER" in the last 72 hours. Hgb A1c No results for input(s): "HGBA1C" in the last 72 hours. Lipid Profile No results for input(s): "CHOL", "HDL", "LDLCALC", "TRIG", "CHOLHDL", "LDLDIRECT" in the last 72 hours. Thyroid  function studies No results for input(s): "TSH", "T4TOTAL", "T3FREE", "THYROIDAB" in the last 72 hours.  Invalid input(s): "FREET3" Anemia work up No results for input(s): "VITAMINB12", "FOLATE", "FERRITIN", "TIBC", "IRON", "RETICCTPCT" in the last 72 hours. Urinalysis    Component Value Date/Time   COLORURINE YELLOW (A) 07/18/2022 1912   APPEARANCEUR CLOUDY (A) 07/18/2022 1912   LABSPEC 1.017 07/18/2022 1912   PHURINE 5.0 07/18/2022 1912   GLUCOSEU NEGATIVE 07/18/2022 1912   HGBUR LARGE (A) 07/18/2022 1912   BILIRUBINUR NEGATIVE 07/18/2022 1912   KETONESUR NEGATIVE 07/18/2022 1912   PROTEINUR >=300 (A) 07/18/2022 1912   UROBILINOGEN 0.2 06/23/2017 1108   NITRITE NEGATIVE 07/18/2022 1912   LEUKOCYTESUR MODERATE (A) 07/18/2022 1912   Sepsis Labs Recent Labs  Lab 02/19/23 0419 02/20/23 0423 02/21/23 0709 02/22/23 0621  WBC 8.5 7.7 6.2 4.4   Microbiology Recent Results (from the past 240 hour(s))  Blood culture (routine x 2)     Status: None   Collection Time: 02/18/23  3:45 PM    Specimen: BLOOD  Result Value Ref Range Status   Specimen Description BLOOD SITE NOT SPECIFIED  Final   Special Requests   Final    BOTTLES DRAWN AEROBIC AND ANAEROBIC Blood Culture results may not be optimal due to an excessive volume of blood received in culture bottles   Culture   Final    NO GROWTH 5 DAYS Performed at [facility], [address] [place], [place] [address]    Report Status 02/23/2023 FINAL  Final  Blood culture (routine x 2)     Status: None   Collection Time: 02/18/23  3:45 PM   Specimen: BLOOD  Result Value Ref Range Status   Specimen Description BLOOD SITE NOT SPECIFIED  Final   Special Requests   Final    BOTTLES DRAWN AEROBIC AND ANAEROBIC Blood Culture adequate volume   Culture   Final    NO GROWTH 5 DAYS Performed at [facility], [address] [place], [place] [address]    Report Status 02/23/2023 FINAL  Final  Surgical pcr screen     Status: Abnormal   Collection Time: 02/19/23 10:26 AM   Specimen: Nasal Mucosa; Nasal Swab  Result Value Ref Range Status   MRSA, PCR NEGATIVE NEGATIVE Final   Staphylococcus aureus POSITIVE (A) NEGATIVE Final    Comment: (NOTE) The Xpert SA Assay (FDA approved for NASAL specimens in patients 55 years of age and older), is one component of a comprehensive surveillance program. It is not intended to diagnose infection nor to guide or monitor treatment. Performed at [facility], [address], [place], [place] [address]   Aerobic/Anaerobic Culture w Gram Stain (surgical/deep wound)     Status: None   Collection Time: 02/19/23 12:12 PM   Specimen: Bone; Tissue  Result Value Ref Range Status   Specimen Description BONE  Final   Special Requests INFECTED RIGHT BONE  Final   Gram Stain RARE WBC SEEN RARE GRAM NEGATIVE RODS   Final   Culture   Final    No growth aerobically or anaerobically. Performed at [facility], [address] [place], [place] [address]    Report Status 02/24/2023  FINAL  Final  Aerobic/Anaerobic Culture w Gram Stain (surgical/deep wound)     Status: None   Collection Time: 02/19/23 12:13 PM   Specimen: Bone; Tissue  Result Value Ref Range Status   Specimen Description BONE  Final   Special Requests  RIGHT FOOT RESIDUAL BONE METATARSAL  Final   Gram Stain NO WBC SEEN NO ORGANISMS SEEN   Final   Culture   Final    No growth aerobically or anaerobically. Performed at [facility], [address] [place], [place] [address]    Report Status 02/24/2023 FINAL  Final    Procedures/Studies: DG Foot Complete Right  Result Date: 02/19/2023 CLINICAL DATA:  [id number] Osteomyelitis of right foot [facility]) [id number] EXAM: RIGHT FOOT COMPLETE - 3+ VIEW COMPARISON:  MRI foot 02/16/2023, x-ray right foot 05/27/2022 FINDINGS: Redemonstration of prior amputation of the 5 metacarpals down to the bases. Interval development of cortical erosion of the first metatarsal base. Vague indeterminate periosteal reaction along the metatarsal base of the second digit. No acute displaced fracture or dislocation. Posterior and plantar calcaneal spurs. Subcutaneus soft tissue edema with overlying skin staples. IMPRESSION: Interval development of cortical erosion of the first metatarsal base. Concern for acute osteomyelitis. Vague indeterminate periosteal reaction along the metatarsal base of the second digit. Consider correlation with MRI. Electronically Signed   By: Tish Frederickson M.D.   On: 02/19/2023 18:06   MR FOOT RIGHT W WO CONTRAST  Result Date: 02/16/2023 CLINICAL DATA:  History of toe amputation in 2020. Ulcer on bottom of right foot for 2.5 months. EXAM: MRI OF THE RIGHT FOREFOOT WITHOUT AND WITH CONTRAST TECHNIQUE: Multiplanar, multisequence MR imaging of the right hindfoot and midfoot was performed before and after the administration of intravenous contrast. CONTRAST:  10mL GADAVIST GADOBUTROL 1 MMOL/ML IV SOLN COMPARISON:  Right foot radiographs 05/27/2022, CT right foot  05/22/2022, right foot radiographs 05/22/2022, MRI right forefoot 10/22/2020 FINDINGS: Bones/Joint/Cartilage Postsurgical changes are again seen of amputation of the first through fifth metatarsals at the proximal metaphysis of the first metatarsal and proximal shaft of the second through fifth metatarsals. There is moderate subcutaneous fat edema, enhancement, and swelling within the distal medial soft tissue stump, predominantly around the first through third metatarsals. There is rim enhancing fluid suspicious for an abscess distal and plantar to the first metatarsal, measuring up to approximately 1.0 x 2.3 x 4.7 cm (long axis of the foot by transverse by short axis of the foot dimensions, sagittal series 12 image 9 and coronal 1030 series, image 15). This abscess appears to drain through a small 5 mm ulcer within the distal postsurgical stump soft tissues (coronal image 16 and sagittal image 9) plantar and distal to the first and second metatarsal stumps. There is wenhancing surrounding inflammatory phlegmon extending from this region plantar to the second and third postsurgical metatarsal bases. There is focal edema and enhancement within the distal approximately 8 mm of the remaining postsurgical proximal aspect of the second metatarsal suspicious for focal acute osteomyelitis. Minimal increased T2 signal is seen at the distal amputation sites of the third through fifth metatarsals, compatible with reactive change, however early acute osteomyelitis is difficult to exclude. Ligaments The Lisfranc ligament complex is intact. Muscles and Tendons There is high-grade edema throughout the flexor digitorum brevis distal musculature, which may be secondary to neurogenic and/or infectious myositis. Mild edema within the extensor digitorum brevis muscle. Soft tissues Please see above. There is metallic susceptibility artifact at the distal lateral postsurgical stump soft tissues (axial series 11, image 26 and coronal  series 1023 image 6, nonspecific and possibly external to the patient. IMPRESSION: 1. Postsurgical changes of amputation of the first through fifth metatarsals at the proximal metaphysis of the first metatarsal and proximal shaft of the second through  fifth metatarsals. 2. There is a 1.0 x 2.3 x 4.7 cm abscess distal and plantar to the first metatarsal, which appears to drain through a small 5 mm ulcer within the distal postsurgical stump soft tissues. 3. Focal marrow edema and enhancement within the distal approximately 8 mm of the remaining postsurgical proximal aspect of the second metatarsal suspicious for focal acute osteomyelitis. 4. Minimal increased T2 signal is seen at the distal amputation sites of the third through fifth metatarsals, compatible with reactive change, however early acute osteomyelitis is difficult to exclude. 5. There is high-grade edema throughout the flexor digitorum brevis distal musculature, which may be secondary to neurogenic and/or infectious myositis. Electronically Signed   By: Neita Garnet M.D.   On: 02/16/2023 19:02     Time coordinating discharge: Over 30 minutes    Lewie Chamber, MD  [facility] 02/24/2023, 11:42 AM

## 2023-02-24 NOTE — Progress Notes (Signed)
Called report to [place], Charity fundraiser at [employer].  Informed her that pt was enroute.

## 2023-02-24 NOTE — Progress Notes (Signed)
Physical Therapy Treatment Patient Details Name: [name] [name] MRN: [id number] DOB: 29-Jan-1966 Today's Date: 02/24/2023   History of Present Illness 57 yo female presents to [facility] 11/1 with R foot osteomyelitis. S/p R TMA revision with I&D on 11/2. PMH includes  GERD, hypertension, asthma, prior TMA R foot, depression, OSA, bipolar disorder, obesity.    PT Comments  Pt received in supine, agreeable to therapy session with emphasis on supine, seated and standing BLE exercises for strengthening. Pt needing up to modA to stand from lowest bed height<>RW and minA to stand from slightly elevated surface using L bed rail to push up to standing. Pt reports improved technique and less difficulty when PTA encouraged her to keep RUE up on RW handle and pushing from bed with LUE. Pt quite fatigued after hopping toward her L side to simulate "hop pivot" transfer. Pt continues to benefit from PT services to progress toward functional mobility goals.    If plan is discharge home, recommend the following: A little help with walking and/or transfers;A little help with bathing/dressing/bathroom   Can travel by private vehicle     No  Equipment Recommendations  None recommended by PT (pt has wc and knee scooter at home already)    Recommendations for Other Services       Precautions / Restrictions Precautions Precautions: Fall Precaution Comments: Contact precs Restrictions Weight Bearing Restrictions: Yes RLE Weight Bearing: Non weight bearing     Mobility  Bed Mobility Overal bed mobility: Needs Assistance Bed Mobility: Sidelying to Sit, Sit to Supine Rolling: Modified independent (Device/Increase time), Used rails Sidelying to sit: Supervision, Used rails, HOB elevated   Sit to supine: Supervision, Used rails   General bed mobility comments: to L EOB; log roll technique per pt preference and propping up on her elbow.    Transfers Overall transfer level: Needs assistance Equipment used:  Rolling walker (2 wheels) Transfers: Sit to/from Stand, Bed to chair/wheelchair/BSC Sit to Stand: Min assist, Mod assist, From elevated surface   Step pivot transfers: Min assist, From elevated surface       General transfer comment: from lowest bed height pt needing modA to stand to RW while pushing up with RUE. Bed height elevated ~3" and pt encouraged to use RUE on RW and pushing from bed with LUE, pt only needing minA with this technique and also took a few L sided hops toward [facility] with RW to simulate step pivot transfer, c/o fatigue with hopping. Compliant with RLE NWB precs.    Ambulation/Gait               General Gait Details: pt defers knee scooter mobility after performing BLE exercises and transfers at EOB.   Stairs             Wheelchair Mobility     Tilt Bed    Modified Rankin (Stroke Patients Only)       Balance Overall balance assessment: Needs assistance Sitting-balance support: No upper extremity supported, Single extremity supported, Feet unsupported Sitting balance-Leahy Scale: Good Sitting balance - Comments: Fair unsupported, Good with single UE support for dynamic seated tasks   Standing balance support: Bilateral upper extremity supported, During functional activity, Reliant on assistive device for balance Standing balance-Leahy Scale: Poor Standing balance comment: reliant on BUE support given WB restrictions, poor tolerance for standing                            Cognition  Arousal: Alert Behavior During Therapy: WFL for tasks assessed/performed Overall Cognitive Status: Within Functional Limits for tasks assessed                                          Exercises Other Exercises Other Exercises: supine BLE AROM: ankle pumps, heel slides, quad sets x10 reps ea Other Exercises: seated BLE AROM: LAQ, hip flexion x10 reps ea with 3 sec hold for LAQ Other Exercises: STS x 3 reps for BLE strengthening     General Comments General comments (skin integrity, edema, etc.): SpO2/HR WFL on RA      Pertinent Vitals/Pain Pain Assessment Pain Assessment: Faces Faces Pain Scale: Hurts little more Pain Location: RLE (ankle/knee/hip) and bil shoulders Pain Descriptors / Indicators: Discomfort Pain Intervention(s): Monitored during session, Limited activity within patient's tolerance, Repositioned (pt defers premedication until closer to DC time)    Home Living                          Prior Function            PT Goals (current goals can now be found in the care plan section) Acute Rehab PT Goals Patient Stated Goal: home PT Goal Formulation: With patient Time For Goal Achievement: 03/06/23 Progress towards PT goals: Progressing toward goals    Frequency    Min 1X/week      PT Plan      Co-evaluation              AM-PAC PT "6 Clicks" Mobility   Outcome Measure  Help needed turning from your back to your side while in a flat bed without using bedrails?: A Little (if no rail) Help needed moving from lying on your back to sitting on the side of a flat bed without using bedrails?: A Little Help needed moving to and from a bed to a chair (including a wheelchair)?: A Little Help needed standing up from a chair using your arms (e.g., wheelchair or bedside chair)?: A Lot (from lower bed height<>RW) Help needed to walk in hospital room?: A Little (CGA for knee scooter, Total = 1 for RW) Help needed climbing 3-5 steps with a railing? : Total 6 Click Score: 15    End of Session Equipment Utilized During Treatment: Gait belt Activity Tolerance: Patient tolerated treatment well;Patient limited by fatigue Patient left: with call bell/phone within reach;in bed;with bed alarm set (RLE elevated) Nurse Communication: Mobility status PT Visit Diagnosis: Other abnormalities of gait and mobility (R26.89);Muscle weakness (generalized) (M62.81)     Time: 9518-8416 PT Time  Calculation (min) (ACUTE ONLY): 15 min  Charges:    $Therapeutic Exercise: 8-22 mins PT General Charges $$ ACUTE PT VISIT: 1 Visit                     Sherran Margolis P., PTA Acute Rehabilitation Services Secure Chat Preferred 9a-5:30pm Office: [phone]    Dorathy Kinsman [facility] 02/24/2023, 4:13 PM

## 2023-02-24 NOTE — Progress Notes (Signed)
Attempt to call report to RN at [facility] [employer]. No one picked up.  Left a message for receptionist for RN to call back at [phone].  [facility] currently here to pick up pt to take her to the facility. Awaiting return call from Rn from [facility] [employer].

## 2023-02-24 NOTE — Plan of Care (Signed)
Problem: Education: Goal: Knowledge of General Education information will improve Description: Including pain rating scale, medication(s)/side effects and non-pharmacologic comfort measures Outcome: Progressing Pt admitted into the hospital for osteomyelitis of the right foot.   She had a right revision transmetatarsal amputation with I/D on 02/19/2023.  Pt currently is awaiting discharge to [facility].  Awaiting on transport via [facility] to transport her to facility      Problem: Clinical Measurements: Goal: Will remain free from infection Outcome: Progressing S/Sx of infection monitored and assessed q-shift.  Pt has remained afebrile thus far.  She is on PO abx per MD's orders.    Problem: Clinical Measurements: Goal: Respiratory complications will improve Outcome: Progressing Respiratory status monitored and assessed q-shift.  Pt is on room air with PO2 at 96-97% and respiration rate of 16 breaths per minute.  Pt has not endorsed c/o SOB or DOE.    Problem: Activity: Goal: Risk for activity intolerance will decrease Outcome: Progressing Pt is codependent of all her ADLs.  She need the assistance of RN staff to get her OOB to the [facility] with a FWW.    Problem: Nutrition: Goal: Adequate nutrition will be maintained Outcome: Progressing Pt is on a carb modified diet per MD's orders.  She has been able to tolerate her diet w/o s/sx of n/v or abdominal pain/ distention.    Problem: Pain Managment: Goal: General experience of comfort will improve Outcome: Progressing   Pt has endorsed c/o 2-6/10 right foot pain r/t  right revision transmetatarsal amputation with I/D on 02/19/2023 describing it as a constant sharp pain.  Reiterated pain scale so she could adequately rate her pain.  Pt stated her pain goal would be 0/10.  Discussed nonpharmacological methods to help reduce s/sx of pain.  Interventions given per pt's request and MD's orders.

## 2023-02-25 DIAGNOSIS — G43E19 Chronic migraine with aura, intractable, without status migrainosus: Secondary | ICD-10-CM | POA: Diagnosis not present

## 2023-02-25 DIAGNOSIS — G894 Chronic pain syndrome: Secondary | ICD-10-CM | POA: Diagnosis not present

## 2023-02-25 DIAGNOSIS — K5900 Constipation, unspecified: Secondary | ICD-10-CM | POA: Diagnosis not present

## 2023-02-25 DIAGNOSIS — M869 Osteomyelitis, unspecified: Secondary | ICD-10-CM | POA: Diagnosis not present

## 2023-02-27 DIAGNOSIS — M869 Osteomyelitis, unspecified: Secondary | ICD-10-CM | POA: Diagnosis not present

## 2023-02-27 DIAGNOSIS — G894 Chronic pain syndrome: Secondary | ICD-10-CM | POA: Diagnosis not present

## 2023-02-27 DIAGNOSIS — G4733 Obstructive sleep apnea (adult) (pediatric): Secondary | ICD-10-CM | POA: Diagnosis not present

## 2023-02-27 DIAGNOSIS — K5900 Constipation, unspecified: Secondary | ICD-10-CM | POA: Diagnosis not present

## 2023-02-27 DIAGNOSIS — R110 Nausea: Secondary | ICD-10-CM | POA: Diagnosis not present

## 2023-02-27 DIAGNOSIS — R531 Weakness: Secondary | ICD-10-CM | POA: Diagnosis not present

## 2023-02-27 DIAGNOSIS — M545 Low back pain, unspecified: Secondary | ICD-10-CM | POA: Diagnosis not present

## 2023-02-27 DIAGNOSIS — M6281 Muscle weakness (generalized): Secondary | ICD-10-CM | POA: Diagnosis not present

## 2023-02-28 DIAGNOSIS — D631 Anemia in chronic kidney disease: Secondary | ICD-10-CM | POA: Diagnosis not present

## 2023-02-28 DIAGNOSIS — D509 Iron deficiency anemia, unspecified: Secondary | ICD-10-CM | POA: Diagnosis not present

## 2023-02-28 DIAGNOSIS — R110 Nausea: Secondary | ICD-10-CM | POA: Diagnosis not present

## 2023-02-28 DIAGNOSIS — K5900 Constipation, unspecified: Secondary | ICD-10-CM | POA: Diagnosis not present

## 2023-02-28 DIAGNOSIS — M6281 Muscle weakness (generalized): Secondary | ICD-10-CM | POA: Diagnosis not present

## 2023-03-01 DIAGNOSIS — M869 Osteomyelitis, unspecified: Secondary | ICD-10-CM | POA: Diagnosis not present

## 2023-03-01 DIAGNOSIS — M6281 Muscle weakness (generalized): Secondary | ICD-10-CM | POA: Diagnosis not present

## 2023-03-01 DIAGNOSIS — G894 Chronic pain syndrome: Secondary | ICD-10-CM | POA: Diagnosis not present

## 2023-03-02 ENCOUNTER — Telehealth: Payer: Self-pay

## 2023-03-02 ENCOUNTER — Ambulatory Visit: Payer: 59 | Admitting: Podiatry

## 2023-03-02 DIAGNOSIS — I10 Essential (primary) hypertension: Secondary | ICD-10-CM | POA: Diagnosis not present

## 2023-03-02 DIAGNOSIS — K219 Gastro-esophageal reflux disease without esophagitis: Secondary | ICD-10-CM | POA: Diagnosis not present

## 2023-03-02 DIAGNOSIS — G894 Chronic pain syndrome: Secondary | ICD-10-CM | POA: Diagnosis not present

## 2023-03-02 DIAGNOSIS — M869 Osteomyelitis, unspecified: Secondary | ICD-10-CM | POA: Diagnosis not present

## 2023-03-02 DIAGNOSIS — M6281 Muscle weakness (generalized): Secondary | ICD-10-CM | POA: Diagnosis not present

## 2023-03-02 NOTE — Telephone Encounter (Signed)
Thanks

## 2023-03-02 NOTE — Telephone Encounter (Signed)
Hoy Morn, home health nurse, called to give you a heads up - She was called to the patient's home this morning because her foot was bleeding from the incision site. Upon examination, she did note that the wound had dehisced, with several staples missing. The bleeding had stopped by the time they got there. She redressed it with silver alginate.[name] [name] just wanted [place] to be prepared for the appointment today @ 3:15 Thanks

## 2023-03-04 ENCOUNTER — Ambulatory Visit (INDEPENDENT_AMBULATORY_CARE_PROVIDER_SITE_OTHER): Payer: 59 | Admitting: Podiatry

## 2023-03-04 ENCOUNTER — Encounter: Payer: Self-pay | Admitting: Podiatry

## 2023-03-04 ENCOUNTER — Ambulatory Visit (INDEPENDENT_AMBULATORY_CARE_PROVIDER_SITE_OTHER): Payer: 59

## 2023-03-04 ENCOUNTER — Ambulatory Visit: Payer: Self-pay | Admitting: *Deleted

## 2023-03-04 DIAGNOSIS — T8131XA Disruption of external operation (surgical) wound, not elsewhere classified, initial encounter: Secondary | ICD-10-CM | POA: Diagnosis not present

## 2023-03-04 DIAGNOSIS — Z89431 Acquired absence of right foot: Secondary | ICD-10-CM

## 2023-03-04 MED ORDER — SULFAMETHOXAZOLE-TRIMETHOPRIM 800-160 MG PO TABS
1.0000 | ORAL_TABLET | Freq: Two times a day (BID) | ORAL | Status: AC
Start: 2023-03-04 — End: 2023-03-15

## 2023-03-04 NOTE — Progress Notes (Signed)
Chief Complaint  Patient presents with   Post-op Problem    Staples came apart Tuesday night/Wednesday morning. No pain, odor.  Wound care nurse checks every day   Date of surgery: 02/19/2023 Procedure: Right foot transmetatarsal amputation revision with Dr. Ralene Cork  HPI: 57 y.o. female presents today for first postoperative visit for right foot transmetatarsal amputation revision.  She presents from her facility.  She has been on oral Bactrim since discharge from the hospital.  Bone pathology and deep surgical cultures of the negative for osteomyelitis.  Patient states that they have been changing her dressing at the facility due to some bleeding and strikethrough, the patient states that she also has hit her foot on the headboard several times and that some of the skin staples were lost due to this.  States pain has been well-controlled.  She denies any nausea, vomiting, chills, chest pain shortness of breath.  Past Medical History:  Diagnosis Date   Acute metabolic encephalopathy 06/08/2022   AMS (altered mental status) 05/22/2022   Anxiety    Arthritis    Phreesia 02/08/2020   Asthma    mild intermittent   Asthma    Phreesia 02/08/2020   Bipolar 1 disorder ([name])    ect treatments last treatment Sep 02 1011   Depression    Depression    Phreesia 02/08/2020   Depression    Phreesia 07/10/2020   GERD (gastroesophageal reflux disease)    Headache    Hypertension    Pre-diabetes    Seizures ([name])    Thinks it was from taking Tramadol   Septic shock ([name]) 05/22/2022   Shortness of breath 09/23/2016   Overview:   exertional   Sleep apnea    AHI 40 (2024)   Substance abuse ([name])    Phreesia 02/08/2020   Syncope 08/26/2019   Syncope and collapse     Past Surgical History:  Procedure Laterality Date   ABDOMINAL HYSTERECTOMY     AMPUTATION Right 02/19/2023   Procedure: AMPUTATION FOOT, revision and Incision and drainage;  Surgeon: Louann Sjogren, DPM;  Location: [facility]  OR;  Service: Orthopedics/Podiatry;  Laterality: Right;   AMPUTATION TOE Left 02/02/2018   Procedure: AMPUTATION TOE Left 4th toe;  Surgeon: Vivi Barrack, DPM;  Location: [facility] OR;  Service: Podiatry;  Laterality: Left;   APPENDECTOMY N/A    Phreesia 02/08/2020   BACK SURGERY  2018   ACDF   C5-6 & C6-7 by Dr. Sharolyn Douglas   CARPAL TUNNEL RELEASE     x2   I & D EXTREMITY Right 05/22/2022   Procedure: IRRIGATION AND DEBRIDEMENT EXTREMITY;  Surgeon: Candelaria Stagers, DPM;  Location: [facility] ORS;  Service: Podiatry;  Laterality: Right;   INCISION AND DRAINAGE Right 05/24/2022   Procedure: RIGHT REVISION INCISION AND DRAINAGE/ WASHOUT WITH PRIMARY DELAYED CLOSURE;  Surgeon: Edwin Cap, DPM;  Location: [facility] ORS;  Service: Podiatry;  Laterality: Right;   IRRIGATION AND DEBRIDEMENT ABSCESS Right 10/25/2020   Procedure: IRRIGATION AND DEBRIDEMENT ABSCESS OF FOOT AND APPLICATION OF GRAFT;  Surgeon: Vivi Barrack, DPM;  Location: [facility] OR;  Service: Podiatry;  Laterality: Right;   Laproscopic knee surgery Right    NECK SURGERY  03/15/2017   ACDF   by Dr. Noel Gerold   PLANTAR FASCIA RELEASE     x2   REVERSE SHOULDER ARTHROPLASTY Right 04/14/2022   Procedure: REVERSE SHOULDER ARTHROPLASTY;  Surgeon: Bjorn Pippin, MD;  Location: [facility] ORS;  Service: Orthopedics;  Laterality: Right;   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   Procedure: TRANSMETATARSAL AMPUTATION;  Surgeon: Vivi Barrack, DPM;  Location: [facility] ORS;  Service: Podiatry;  Laterality: Right;   TRANSMETATARSAL AMPUTATION Right 05/27/2022   Procedure: REVISION TRANSMETATARSAL AMPUTATION WITH DELAYED PRIMARY CLOSURE;  Surgeon: Felecia Shelling, DPM;  Location: [facility] ORS;  Service: Podiatry;  Laterality: Right;    Allergies  Allergen Reactions   Tetracyclines & Related Other (See Comments)    Syncope and put her "in a coma"   Tramadol Other (See Comments)    Seizures   Oxybutynin Other (See Comments)    Unknown reaction per Mom    Phenazopyridine Other (See Comments)    Unknown reaction   Ciprofloxacin Rash and Itching   Codeine Itching and Rash   Estradiol Rash    Patches broke out the skin    ROS negative except as stated in HPI   Physical Exam: There were no vitals filed for this visit.  General: The patient is alert and oriented x3 in no acute distress.  Presenting in wheelchair.  Postoperative shoe applied  Right foot focused exam: Dressing taken down revealing dehiscence of the surgical site at the central and medial aspect with loss of skin staples and sutures.  Very mild definitive are present.  No foul odor.  No purulence.  Areas of dehiscence to probe deep.  At the plantar aspect of the flap there is darkened discoloration however this area is warm to touch without fluctuance.  Radiographic Exam:  Right foot 03/04/2023 Postsurgical changes status post revisional right foot transmetatarsal amputation on observed.  No osteolysis or cortical notes abnormal soft tissue present.  No radiographic evidence of a deep infection  Assessment/Plan of Care: 1. History of amputation of right midfoot ([name])   2. Surgical wound dehiscence, initial encounter      Meds ordered this encounter  Medications   sulfamethoxazole-trimethoprim (BACTRIM DS) 800-160 MG tablet    Sig: Take 1 tablet by mouth every 12 (twelve) hours for 22 doses.   None  Discussed clinical findings with patient today.  # Status post right transmetatarsal amputation with dehiscence -Loose staples centrally and medially were removed.  Remaining staples were left intact -Dressing reapplied consisting of Xeroform to the incision sites, dry gauze, ABD, Kerlix, Ace wrap -No radiographic evidence to suggest deep infection.  Due to some peri-incisional redness and presence of dehiscence, extending patient's course of oral Bactrim for 2 more weeks -Placing patient in a cam walker boot to further protect the surgical site.  Continue  nonweightbearing. -Follow-up with Dr. Ralene Cork in 1 week for further postoperative care.   Morgaine Kimball L. Marchia Bond, AACFAS [facility]     [address] [place], [place] [address]                Office [phone]  Fax [phone]

## 2023-03-04 NOTE — Patient Instructions (Addendum)
 Visit Information  Thank you for taking time to visit with me today. Please don't hesitate to contact me if I can be of assistance to you.   Following are the goals we discussed today:   Goals Addressed             This Visit's Progress    Home management of home health services, Congestive heart failure/infected stump ulcer- care coordination services   On track    Interventions Today    Flowsheet Row Most Recent Value  Chronic Disease   Chronic disease during today's visit Other  [At [facility] for [facility], recent amputation revision site with some bloody drainage]  General Interventions   General Interventions Discussed/Reviewed General Interventions Reviewed, Community Resources  Exercise Interventions   Exercise Discussed/Reviewed Exercise Reviewed, Physical Activity  Physical Activity Discussed/Reviewed Physical Activity Reviewed  Gennie Alma [facility] for rehab]  Education Interventions   Education Provided Provided Education  [transition of care services after discharged]              Our next appointment is by telephone on pending discharge home at pending  Please call the care guide team at [phone] if you need to cancel or reschedule your appointment.   If you are experiencing a Mental Health or Behavioral Health Crisis or need someone to talk to, please call the Suicide and Crisis Lifeline: 988 call the [place] National Suicide Prevention Lifeline: 253 233 3924 or TTY: (608) 085-9736 TTY (619) 189-3386) to talk to a trained counselor call 1-800-273-TALK (toll free, 24 hour hotline) call the [facility]: 404-622-5178 call 911   Patient verbalizes understanding of instructions and care plan provided today and agrees to view in MyChart. Active MyChart status and patient understanding of how to access instructions and care plan via MyChart confirmed with patient.     The patient has been provided with contact  information for the care management team and has been advised to call with any health related questions or concerns.   Haru Shaff L. Noelle Penner, RN, BSN, [facility]  VBCI Care Management Coordinator  [phone]  Fax: [phone]

## 2023-03-05 DIAGNOSIS — M869 Osteomyelitis, unspecified: Secondary | ICD-10-CM | POA: Diagnosis not present

## 2023-03-05 DIAGNOSIS — M6281 Muscle weakness (generalized): Secondary | ICD-10-CM | POA: Diagnosis not present

## 2023-03-05 DIAGNOSIS — Z889 Allergy status to unspecified drugs, medicaments and biological substances status: Secondary | ICD-10-CM | POA: Diagnosis not present

## 2023-03-06 DIAGNOSIS — R638 Other symptoms and signs concerning food and fluid intake: Secondary | ICD-10-CM | POA: Diagnosis not present

## 2023-03-06 DIAGNOSIS — M6281 Muscle weakness (generalized): Secondary | ICD-10-CM | POA: Diagnosis not present

## 2023-03-07 DIAGNOSIS — D509 Iron deficiency anemia, unspecified: Secondary | ICD-10-CM | POA: Diagnosis not present

## 2023-03-07 DIAGNOSIS — D631 Anemia in chronic kidney disease: Secondary | ICD-10-CM | POA: Diagnosis not present

## 2023-03-09 ENCOUNTER — Telehealth: Payer: Self-pay | Admitting: Podiatry

## 2023-03-09 ENCOUNTER — Other Ambulatory Visit: Payer: Self-pay | Admitting: *Deleted

## 2023-03-09 DIAGNOSIS — G894 Chronic pain syndrome: Secondary | ICD-10-CM | POA: Diagnosis not present

## 2023-03-09 DIAGNOSIS — M6281 Muscle weakness (generalized): Secondary | ICD-10-CM | POA: Diagnosis not present

## 2023-03-09 DIAGNOSIS — D649 Anemia, unspecified: Secondary | ICD-10-CM | POA: Diagnosis not present

## 2023-03-09 NOTE — Patient Outreach (Signed)
Mrs. Steeb resides in [facility]. She is active with chronic care management team RN CM.   Telephonic collaborative meeting with Wilkie Aye, SNF social worker and Dipam, Sales promotion account executive. Mrs. Roccaforte has follow up appointment scheduled with podiatrist on Monday. Transition plan is to return home with son. Continues to require quite a bit of assistance with therapy.   Will continue to follow and update RN CM as appropriate.   Raiford Noble, MSN, RN, BSN [place]  [facility] RN Post- Acute Care Manager Direct Dial: [phone]

## 2023-03-09 NOTE — Telephone Encounter (Signed)
Received vm yesterday from [facility] stating pt was seen last week and was to have had an appt scheduled for a week. Her number is [phone] ext [phone].   I returned call and left message for Tammy at her nursing facility letting her know pt has appt 11/25 at 200pm

## 2023-03-11 ENCOUNTER — Other Ambulatory Visit: Payer: Self-pay | Admitting: Family Medicine

## 2023-03-12 DIAGNOSIS — R638 Other symptoms and signs concerning food and fluid intake: Secondary | ICD-10-CM | POA: Diagnosis not present

## 2023-03-12 DIAGNOSIS — R110 Nausea: Secondary | ICD-10-CM | POA: Diagnosis not present

## 2023-03-12 DIAGNOSIS — M6281 Muscle weakness (generalized): Secondary | ICD-10-CM | POA: Diagnosis not present

## 2023-03-14 ENCOUNTER — Ambulatory Visit (INDEPENDENT_AMBULATORY_CARE_PROVIDER_SITE_OTHER): Payer: 59 | Admitting: Podiatry

## 2023-03-14 DIAGNOSIS — T8131XA Disruption of external operation (surgical) wound, not elsewhere classified, initial encounter: Secondary | ICD-10-CM

## 2023-03-14 DIAGNOSIS — Z89431 Acquired absence of right foot: Secondary | ICD-10-CM

## 2023-03-14 NOTE — Telephone Encounter (Signed)
Requested medication (s) are due for refill today: yes  Requested medication (s) are on the active medication list: yes  Last refill:  10/08/22 #1/5  Future visit scheduled: no  Notes to clinic:  Unable to refill per protocol, medication not assigned to the refill protocol.      Requested Prescriptions  Pending Prescriptions Disp Refills   AIMOVIG 70 MG/ML SOAJ [Pharmacy Med Name: AIMOVIG 70MG /ML AUTOINJECT 70 Injectable] 1 mL 10    Sig: INJECT 1 ML SUBCUTANEOUSLY EVERY 30 DAYS     Off-Protocol Failed - 03/11/2023  7:04 PM      Failed - Medication not assigned to a protocol, review manually.      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

## 2023-03-14 NOTE — Progress Notes (Signed)
Subjective:  Patient ID: [name] [name], female    DOB: 07/25/1965,  MRN: [id number]  No chief complaint on file.   DOS: 02/19/23 Procedure: Right transmetatarsal amputation revision  57 y.o. female returns for POV#2. Was seen previous after having bleeding through dressing. She had hit the foot in rehab and the wound opened up. Seen by Dr. Jamse Arn last week and advised on dressing. Has been doing well. Currently still taking antibiotics.   Review of Systems: Negative except as noted in the HPI. Denies N/V/F/Ch.  Past Medical History:  Diagnosis Date   Acute metabolic encephalopathy 06/08/2022   AMS (altered mental status) 05/22/2022   Anxiety    Arthritis    Phreesia 02/08/2020   Asthma    mild intermittent   Asthma    [facility]reesia 02/08/2020   Bipolar 1 disorder ([name])    ect treatments last treatment Sep 02 1011   Depression    Depression    [facility]ia 02/08/2020   Depression    [facility]ia 07/10/2020   GERD (gastroesophageal reflux disease)    Headache    Hypertension    Pre-diabetes    Seizures ([name])    Thinks it was from taking Tramadol   Septic shock ([name]) 05/22/2022   Shortness of breath 09/23/2016   Overview:   exertional   Sleep apnea    AHI 40 (2024)   Substance abuse ([name])    Phreesia 02/08/2020   Syncope 08/26/2019   Syncope and collapse     Current Outpatient Medications:    busPIRone (BUSPAR) 15 MG tablet, Take 15 mg by mouth 3 (three) times daily. , Disp: , Rfl: 2   Calcium Carbonate-Vit D-Min (CALCIUM 1200 PO), Take 1,200 mg by mouth daily. , Disp: , Rfl:    Erenumab-aooe (AIMOVIG) 70 MG/ML SOAJ, administer 1mL UNDER THE SKIN every 30 DAYS, Disp: 1 mL, Rfl: 5   escitalopram (LEXAPRO) 10 MG tablet, Take 10 mg by mouth daily., Disp: , Rfl:    estradiol (ESTRACE) 1 MG tablet, TAKE ONE TABLET BY MOUTH EVERYDAY AT BEDTIME (Patient taking differently: Take 1 mg by mouth at bedtime.), Disp: 90 tablet, Rfl: 3   famotidine (PEPCID) 40 MG tablet, TAKE 1 TABLET BY  MOUTH EVERY DAY AT BEDTIME, Disp: 90 tablet, Rfl: 1   ferrous sulfate (FEROSUL) 325 (65 FE) MG tablet, TAKE 1 TABLET BY MOUTH AT BREAKFAST AND AT BEDTIME (Patient taking differently: 325 mg 2 (two) times daily with a meal.), Disp: 60 tablet, Rfl: 0   haloperidol (HALDOL) 5 MG tablet, Take 5 mg by mouth at bedtime., Disp: , Rfl:    lamoTRIgine (LAMICTAL) 100 MG tablet, Take 100 mg by mouth daily., Disp: , Rfl:    levocetirizine (XYZAL) 5 MG tablet, TAKE 1 TABLET BY MOUTH EVERY EVENING, Disp: 90 tablet, Rfl: 3   methocarbamol (ROBAXIN) 500 MG tablet, Take 500 mg by mouth every 8 (eight) hours as needed., Disp: , Rfl:    metoprolol succinate (TOPROL-XL) 25 MG 24 hr tablet, Take 25 mg by mouth daily., Disp: , Rfl:    Multiple Vitamins-Minerals (MULTIVITAMIN WITH MINERALS) tablet, Take 1 tablet by mouth daily., Disp: , Rfl:    oxyCODONE-acetaminophen (PERCOCET) 10-325 MG tablet, Take 1 tablet by mouth every 6 (six) hours as needed for pain. Take one tablet by mouth up to 4 times daily as needed., Disp: 30 tablet, Rfl: 0   pantoprazole (PROTONIX) 40 MG tablet, TAKE ONE (1) TABLET BY MOUTH TWICE DAILY (Patient taking differently: Take 40 mg by  mouth 2 (two) times daily.), Disp: 180 tablet, Rfl: 1   polyethylene glycol (MIRALAX / GLYCOLAX) 17 g packet, Take 17 g by mouth daily., Disp: , Rfl:    prazosin (MINIPRESS) 2 MG capsule, Take 2 mg by mouth at bedtime., Disp: , Rfl:    pregabalin (LYRICA) 100 MG capsule, Take 1 capsule (100 mg total) by mouth 3 (three) times daily., Disp: 30 capsule, Rfl: 0   senna-docusate (SENOKOT-S) 8.6-50 MG tablet, Take 1 tablet by mouth at bedtime as needed for mild constipation., Disp: , Rfl:    solifenacin (VESICARE) 10 MG tablet, TAKE 1 TABLET BY MOUTH ONCE DAILY, Disp: 90 tablet, Rfl: 1   sulfamethoxazole-trimethoprim (BACTRIM DS) 800-160 MG tablet, Take 1 tablet by mouth every 12 (twelve) hours for 22 doses., Disp: , Rfl:    SUMAtriptan (IMITREX) 50 MG tablet, Take 50 mg by  mouth every 2 (two) hours as needed for migraine., Disp: , Rfl:    topiramate (TOPAMAX) 50 MG tablet, TAKE ONE (1) TABLET BY MOUTH TWICE DAILY (Patient taking differently: Take 50 mg by mouth 2 (two) times daily.), Disp: 180 tablet, Rfl: 1   traZODone (DESYREL) 100 MG tablet, Take 1 tablet (100 mg total) by mouth at bedtime. (Patient taking differently: Take 200 mg by mouth at bedtime. 2 tablets at bedtime.), Disp: 30 tablet, Rfl: 0  Social History   Tobacco Use  Smoking Status Never  Smokeless Tobacco Never    Allergies  Allergen Reactions   Tetracyclines & Related Other (See Comments)    Syncope and put her "in a coma"   Tramadol Other (See Comments)    Seizures   Oxybutynin Other (See Comments)    Unknown reaction per Mom   Phenazopyridine Other (See Comments)    Unknown reaction   Ciprofloxacin Rash and Itching   Codeine Itching and Rash   Estradiol Rash    Patches broke out the skin   Objective:  There were no vitals filed for this visit. There is no height or weight on file to calculate BMI. Constitutional Well developed. Well nourished.  Vascular Foot warm and well perfused. Capillary refill normal to all digits.   Neurologic Normal speech. Oriented to person, place, and time. Epicritic sensation to light touch grossly present bilaterally.  Dermatologic Skin healing well without signs of infection. Skin edges well coapted without signs of infection. To latera and plantar inicision. Wound dehiscence noted to medial incision about 1 cm x 0.3 cm x 0.3 cm with granular base. No erythema edema or purulence noted.   Orthopedic: Tenderness to palpation noted about the surgical site.   Radiographs: No osseous erosions noted Interval resection of metatarsals.  No growth of cultures and pathology negative for osteomyelitis  Assessment:  No diagnosis found. Plan:  Patient was evaluated and treated and all questions answered.  S/p foot surgery right -Progressing well. Wound  dehiscence present.  Ulcer medial incision distal TMA site with fat layer exposed  -Debridement of dehiscence site.  -Dressed with betadine , DSD. -Off-loading with CAM boot  -No abx indicated.  -Discussed glucose control and proper protein-rich diet.  -Discussed if any worsening redness, pain, fever or chills to call or may need to report to the emergency room. Patient expressed understanding.   Return in 2 weeks for recheck.    No follow-ups on file.   No follow-ups on file.

## 2023-03-15 DIAGNOSIS — S51811A Laceration without foreign body of right forearm, initial encounter: Secondary | ICD-10-CM | POA: Diagnosis not present

## 2023-03-16 DIAGNOSIS — M25561 Pain in right knee: Secondary | ICD-10-CM | POA: Diagnosis not present

## 2023-03-16 DIAGNOSIS — M961 Postlaminectomy syndrome, not elsewhere classified: Secondary | ICD-10-CM | POA: Diagnosis not present

## 2023-03-16 DIAGNOSIS — M25552 Pain in left hip: Secondary | ICD-10-CM | POA: Diagnosis not present

## 2023-03-16 DIAGNOSIS — M25511 Pain in right shoulder: Secondary | ICD-10-CM | POA: Diagnosis not present

## 2023-03-17 DIAGNOSIS — M6281 Muscle weakness (generalized): Secondary | ICD-10-CM | POA: Diagnosis not present

## 2023-03-17 DIAGNOSIS — G894 Chronic pain syndrome: Secondary | ICD-10-CM | POA: Diagnosis not present

## 2023-03-17 DIAGNOSIS — M869 Osteomyelitis, unspecified: Secondary | ICD-10-CM | POA: Diagnosis not present

## 2023-03-19 DIAGNOSIS — I130 Hypertensive heart and chronic kidney disease with heart failure and stage 1 through stage 4 chronic kidney disease, or unspecified chronic kidney disease: Secondary | ICD-10-CM | POA: Diagnosis not present

## 2023-03-19 DIAGNOSIS — Z791 Long term (current) use of non-steroidal anti-inflammatories (NSAID): Secondary | ICD-10-CM | POA: Diagnosis not present

## 2023-03-19 DIAGNOSIS — R7303 Prediabetes: Secondary | ICD-10-CM | POA: Diagnosis not present

## 2023-03-19 DIAGNOSIS — K219 Gastro-esophageal reflux disease without esophagitis: Secondary | ICD-10-CM | POA: Diagnosis not present

## 2023-03-19 DIAGNOSIS — T8781 Dehiscence of amputation stump: Secondary | ICD-10-CM | POA: Diagnosis not present

## 2023-03-19 DIAGNOSIS — M869 Osteomyelitis, unspecified: Secondary | ICD-10-CM | POA: Diagnosis not present

## 2023-03-19 DIAGNOSIS — R569 Unspecified convulsions: Secondary | ICD-10-CM | POA: Diagnosis not present

## 2023-03-19 DIAGNOSIS — N182 Chronic kidney disease, stage 2 (mild): Secondary | ICD-10-CM | POA: Diagnosis not present

## 2023-03-19 DIAGNOSIS — G894 Chronic pain syndrome: Secondary | ICD-10-CM | POA: Diagnosis not present

## 2023-03-19 DIAGNOSIS — I5032 Chronic diastolic (congestive) heart failure: Secondary | ICD-10-CM | POA: Diagnosis not present

## 2023-03-19 DIAGNOSIS — G4733 Obstructive sleep apnea (adult) (pediatric): Secondary | ICD-10-CM | POA: Diagnosis not present

## 2023-03-19 DIAGNOSIS — T8743 Infection of amputation stump, right lower extremity: Secondary | ICD-10-CM | POA: Diagnosis not present

## 2023-03-19 DIAGNOSIS — J45909 Unspecified asthma, uncomplicated: Secondary | ICD-10-CM | POA: Diagnosis not present

## 2023-03-21 ENCOUNTER — Telehealth: Payer: Self-pay

## 2023-03-21 NOTE — Telephone Encounter (Signed)
Copied from CRM [id number]. Topic: Clinical - Home Health Verbal Orders >> Mar 21, 2023  8:39 AM Dennison Nancy wrote: Caller/Agency: Javier Docker calling [facility]  Callback Number: [phone] Service Requested: Skilled nursing Frequency: 3 times a week for 9 weeks  Any new concerns about the patient? N/a

## 2023-03-22 DIAGNOSIS — I5032 Chronic diastolic (congestive) heart failure: Secondary | ICD-10-CM | POA: Diagnosis not present

## 2023-03-22 DIAGNOSIS — M869 Osteomyelitis, unspecified: Secondary | ICD-10-CM | POA: Diagnosis not present

## 2023-03-22 DIAGNOSIS — J45909 Unspecified asthma, uncomplicated: Secondary | ICD-10-CM | POA: Diagnosis not present

## 2023-03-22 DIAGNOSIS — K219 Gastro-esophageal reflux disease without esophagitis: Secondary | ICD-10-CM | POA: Diagnosis not present

## 2023-03-22 DIAGNOSIS — I130 Hypertensive heart and chronic kidney disease with heart failure and stage 1 through stage 4 chronic kidney disease, or unspecified chronic kidney disease: Secondary | ICD-10-CM | POA: Diagnosis not present

## 2023-03-22 DIAGNOSIS — G894 Chronic pain syndrome: Secondary | ICD-10-CM | POA: Diagnosis not present

## 2023-03-22 DIAGNOSIS — T8781 Dehiscence of amputation stump: Secondary | ICD-10-CM | POA: Diagnosis not present

## 2023-03-22 DIAGNOSIS — G4733 Obstructive sleep apnea (adult) (pediatric): Secondary | ICD-10-CM | POA: Diagnosis not present

## 2023-03-22 DIAGNOSIS — Z791 Long term (current) use of non-steroidal anti-inflammatories (NSAID): Secondary | ICD-10-CM | POA: Diagnosis not present

## 2023-03-22 DIAGNOSIS — R569 Unspecified convulsions: Secondary | ICD-10-CM | POA: Diagnosis not present

## 2023-03-22 DIAGNOSIS — R7303 Prediabetes: Secondary | ICD-10-CM | POA: Diagnosis not present

## 2023-03-22 DIAGNOSIS — T8743 Infection of amputation stump, right lower extremity: Secondary | ICD-10-CM | POA: Diagnosis not present

## 2023-03-22 DIAGNOSIS — N182 Chronic kidney disease, stage 2 (mild): Secondary | ICD-10-CM | POA: Diagnosis not present

## 2023-03-23 DIAGNOSIS — G894 Chronic pain syndrome: Secondary | ICD-10-CM | POA: Diagnosis not present

## 2023-03-23 DIAGNOSIS — G4733 Obstructive sleep apnea (adult) (pediatric): Secondary | ICD-10-CM | POA: Diagnosis not present

## 2023-03-23 DIAGNOSIS — J45909 Unspecified asthma, uncomplicated: Secondary | ICD-10-CM | POA: Diagnosis not present

## 2023-03-23 DIAGNOSIS — T8131XA Disruption of external operation (surgical) wound, not elsewhere classified, initial encounter: Secondary | ICD-10-CM | POA: Diagnosis not present

## 2023-03-23 DIAGNOSIS — T8743 Infection of amputation stump, right lower extremity: Secondary | ICD-10-CM | POA: Diagnosis not present

## 2023-03-23 DIAGNOSIS — L97519 Non-pressure chronic ulcer of other part of right foot with unspecified severity: Secondary | ICD-10-CM | POA: Diagnosis not present

## 2023-03-23 DIAGNOSIS — T8781 Dehiscence of amputation stump: Secondary | ICD-10-CM | POA: Diagnosis not present

## 2023-03-23 DIAGNOSIS — K219 Gastro-esophageal reflux disease without esophagitis: Secondary | ICD-10-CM | POA: Diagnosis not present

## 2023-03-23 DIAGNOSIS — I5032 Chronic diastolic (congestive) heart failure: Secondary | ICD-10-CM | POA: Diagnosis not present

## 2023-03-23 DIAGNOSIS — M869 Osteomyelitis, unspecified: Secondary | ICD-10-CM | POA: Diagnosis not present

## 2023-03-23 DIAGNOSIS — I130 Hypertensive heart and chronic kidney disease with heart failure and stage 1 through stage 4 chronic kidney disease, or unspecified chronic kidney disease: Secondary | ICD-10-CM | POA: Diagnosis not present

## 2023-03-23 DIAGNOSIS — R569 Unspecified convulsions: Secondary | ICD-10-CM | POA: Diagnosis not present

## 2023-03-23 DIAGNOSIS — R7303 Prediabetes: Secondary | ICD-10-CM | POA: Diagnosis not present

## 2023-03-23 DIAGNOSIS — Z791 Long term (current) use of non-steroidal anti-inflammatories (NSAID): Secondary | ICD-10-CM | POA: Diagnosis not present

## 2023-03-23 DIAGNOSIS — N182 Chronic kidney disease, stage 2 (mild): Secondary | ICD-10-CM | POA: Diagnosis not present

## 2023-03-24 DIAGNOSIS — R569 Unspecified convulsions: Secondary | ICD-10-CM | POA: Diagnosis not present

## 2023-03-24 DIAGNOSIS — T8131XA Disruption of external operation (surgical) wound, not elsewhere classified, initial encounter: Secondary | ICD-10-CM | POA: Diagnosis not present

## 2023-03-24 DIAGNOSIS — M869 Osteomyelitis, unspecified: Secondary | ICD-10-CM | POA: Diagnosis not present

## 2023-03-24 DIAGNOSIS — Z791 Long term (current) use of non-steroidal anti-inflammatories (NSAID): Secondary | ICD-10-CM | POA: Diagnosis not present

## 2023-03-24 DIAGNOSIS — G894 Chronic pain syndrome: Secondary | ICD-10-CM | POA: Diagnosis not present

## 2023-03-24 DIAGNOSIS — G4733 Obstructive sleep apnea (adult) (pediatric): Secondary | ICD-10-CM | POA: Diagnosis not present

## 2023-03-24 DIAGNOSIS — K219 Gastro-esophageal reflux disease without esophagitis: Secondary | ICD-10-CM | POA: Diagnosis not present

## 2023-03-24 DIAGNOSIS — N182 Chronic kidney disease, stage 2 (mild): Secondary | ICD-10-CM | POA: Diagnosis not present

## 2023-03-24 DIAGNOSIS — T8781 Dehiscence of amputation stump: Secondary | ICD-10-CM | POA: Diagnosis not present

## 2023-03-24 DIAGNOSIS — T8743 Infection of amputation stump, right lower extremity: Secondary | ICD-10-CM | POA: Diagnosis not present

## 2023-03-24 DIAGNOSIS — I5032 Chronic diastolic (congestive) heart failure: Secondary | ICD-10-CM | POA: Diagnosis not present

## 2023-03-24 DIAGNOSIS — I130 Hypertensive heart and chronic kidney disease with heart failure and stage 1 through stage 4 chronic kidney disease, or unspecified chronic kidney disease: Secondary | ICD-10-CM | POA: Diagnosis not present

## 2023-03-24 DIAGNOSIS — S91301A Unspecified open wound, right foot, initial encounter: Secondary | ICD-10-CM | POA: Diagnosis not present

## 2023-03-24 DIAGNOSIS — R7303 Prediabetes: Secondary | ICD-10-CM | POA: Diagnosis not present

## 2023-03-24 DIAGNOSIS — J45909 Unspecified asthma, uncomplicated: Secondary | ICD-10-CM | POA: Diagnosis not present

## 2023-03-25 ENCOUNTER — Telehealth: Payer: Self-pay

## 2023-03-25 NOTE — Telephone Encounter (Signed)
LM on identified voicemail with Angie Dukes PT with verbal order okay. Mjp,lpn   Copied from CRM [id number]. Topic: Clinical - Home Health Verbal Orders >> Mar 25, 2023  9:23 AM Dondra Prader A wrote: Caller/Agency: [facility] Number: [phone] Service Requested: Physical Therapy Frequency: 1 week 2, 2 week 4, 1 week 2 Any new concerns about the patient? No

## 2023-03-28 DIAGNOSIS — Z791 Long term (current) use of non-steroidal anti-inflammatories (NSAID): Secondary | ICD-10-CM | POA: Diagnosis not present

## 2023-03-28 DIAGNOSIS — I5032 Chronic diastolic (congestive) heart failure: Secondary | ICD-10-CM | POA: Diagnosis not present

## 2023-03-28 DIAGNOSIS — G894 Chronic pain syndrome: Secondary | ICD-10-CM | POA: Diagnosis not present

## 2023-03-28 DIAGNOSIS — I130 Hypertensive heart and chronic kidney disease with heart failure and stage 1 through stage 4 chronic kidney disease, or unspecified chronic kidney disease: Secondary | ICD-10-CM | POA: Diagnosis not present

## 2023-03-28 DIAGNOSIS — N182 Chronic kidney disease, stage 2 (mild): Secondary | ICD-10-CM | POA: Diagnosis not present

## 2023-03-28 DIAGNOSIS — T8781 Dehiscence of amputation stump: Secondary | ICD-10-CM | POA: Diagnosis not present

## 2023-03-28 DIAGNOSIS — J45909 Unspecified asthma, uncomplicated: Secondary | ICD-10-CM | POA: Diagnosis not present

## 2023-03-28 DIAGNOSIS — R7303 Prediabetes: Secondary | ICD-10-CM | POA: Diagnosis not present

## 2023-03-28 DIAGNOSIS — M869 Osteomyelitis, unspecified: Secondary | ICD-10-CM | POA: Diagnosis not present

## 2023-03-28 DIAGNOSIS — R569 Unspecified convulsions: Secondary | ICD-10-CM | POA: Diagnosis not present

## 2023-03-28 DIAGNOSIS — K219 Gastro-esophageal reflux disease without esophagitis: Secondary | ICD-10-CM | POA: Diagnosis not present

## 2023-03-28 DIAGNOSIS — G4733 Obstructive sleep apnea (adult) (pediatric): Secondary | ICD-10-CM | POA: Diagnosis not present

## 2023-03-28 DIAGNOSIS — T8743 Infection of amputation stump, right lower extremity: Secondary | ICD-10-CM | POA: Diagnosis not present

## 2023-03-29 ENCOUNTER — Ambulatory Visit (INDEPENDENT_AMBULATORY_CARE_PROVIDER_SITE_OTHER): Payer: 59 | Admitting: Podiatry

## 2023-03-29 ENCOUNTER — Encounter: Payer: Self-pay | Admitting: Podiatry

## 2023-03-29 DIAGNOSIS — T8781 Dehiscence of amputation stump: Secondary | ICD-10-CM | POA: Diagnosis not present

## 2023-03-29 DIAGNOSIS — M545 Low back pain, unspecified: Secondary | ICD-10-CM | POA: Diagnosis not present

## 2023-03-29 DIAGNOSIS — T8743 Infection of amputation stump, right lower extremity: Secondary | ICD-10-CM | POA: Diagnosis not present

## 2023-03-29 DIAGNOSIS — T8130XA Disruption of wound, unspecified, initial encounter: Secondary | ICD-10-CM

## 2023-03-29 DIAGNOSIS — G894 Chronic pain syndrome: Secondary | ICD-10-CM | POA: Diagnosis not present

## 2023-03-29 DIAGNOSIS — R7303 Prediabetes: Secondary | ICD-10-CM | POA: Diagnosis not present

## 2023-03-29 DIAGNOSIS — N182 Chronic kidney disease, stage 2 (mild): Secondary | ICD-10-CM | POA: Diagnosis not present

## 2023-03-29 DIAGNOSIS — I5032 Chronic diastolic (congestive) heart failure: Secondary | ICD-10-CM | POA: Diagnosis not present

## 2023-03-29 DIAGNOSIS — Z89431 Acquired absence of right foot: Secondary | ICD-10-CM

## 2023-03-29 DIAGNOSIS — R569 Unspecified convulsions: Secondary | ICD-10-CM | POA: Diagnosis not present

## 2023-03-29 DIAGNOSIS — I130 Hypertensive heart and chronic kidney disease with heart failure and stage 1 through stage 4 chronic kidney disease, or unspecified chronic kidney disease: Secondary | ICD-10-CM | POA: Diagnosis not present

## 2023-03-29 DIAGNOSIS — Z791 Long term (current) use of non-steroidal anti-inflammatories (NSAID): Secondary | ICD-10-CM | POA: Diagnosis not present

## 2023-03-29 DIAGNOSIS — K219 Gastro-esophageal reflux disease without esophagitis: Secondary | ICD-10-CM | POA: Diagnosis not present

## 2023-03-29 DIAGNOSIS — J45909 Unspecified asthma, uncomplicated: Secondary | ICD-10-CM | POA: Diagnosis not present

## 2023-03-29 DIAGNOSIS — M869 Osteomyelitis, unspecified: Secondary | ICD-10-CM | POA: Diagnosis not present

## 2023-03-29 DIAGNOSIS — G4733 Obstructive sleep apnea (adult) (pediatric): Secondary | ICD-10-CM | POA: Diagnosis not present

## 2023-03-29 DIAGNOSIS — R531 Weakness: Secondary | ICD-10-CM | POA: Diagnosis not present

## 2023-03-29 NOTE — Progress Notes (Signed)
Subjective:  Patient ID: [name] [name], female    DOB: [birth year],  MRN: [id number]  No chief complaint on file.   DOS: 02/19/23 Procedure: Right transmetatarsal amputation revision  57 y.o. female returns for POV#3. Ding well and wound improving. Has been dressing as instructed.   Review of Systems: Negative except as noted in the HPI. Denies N/V/F/Ch.  Past Medical History:  Diagnosis Date   Acute metabolic encephalopathy 06/08/2022   AMS (altered mental status) 05/22/2022   Anxiety    Arthritis    Phreesia 02/08/2020   Asthma    mild intermittent   Asthma    Phreesia 02/08/2020   Bipolar 1 disorder ([name])    ect treatments last treatment Sep 02 1011   Depression    Depression    Phreesia 02/08/2020   Depression    Phreesia 07/10/2020   GERD (gastroesophageal reflux disease)    Headache    Hypertension    Pre-diabetes    Seizures ([name])    Thinks it was from taking Tramadol   Septic shock ([name]) 05/22/2022   Shortness of breath 09/23/2016   Overview:   exertional   Sleep apnea    AHI 40 (2024)   Substance abuse ([name])    Phreesia 02/08/2020   Syncope 08/26/2019   Syncope and collapse     Current Outpatient Medications:    busPIRone (BUSPAR) 15 MG tablet, Take 15 mg by mouth 3 (three) times daily. , Disp: , Rfl: 2   Calcium Carbonate-Vit D-Min (CALCIUM 1200 PO), Take 1,200 mg by mouth daily. , Disp: , Rfl:    Erenumab-aooe (AIMOVIG) 70 MG/ML SOAJ, administer 1mL UNDER THE SKIN every 30 DAYS, Disp: 1 mL, Rfl: 5   escitalopram (LEXAPRO) 10 MG tablet, Take 10 mg by mouth daily., Disp: , Rfl:    estradiol (ESTRACE) 1 MG tablet, TAKE ONE TABLET BY MOUTH EVERYDAY AT BEDTIME (Patient taking differently: Take 1 mg by mouth at bedtime.), Disp: 90 tablet, Rfl: 3   famotidine (PEPCID) 40 MG tablet, TAKE 1 TABLET BY MOUTH EVERY DAY AT BEDTIME, Disp: 90 tablet, Rfl: 1   ferrous sulfate (FEROSUL) 325 (65 FE) MG tablet, TAKE 1 TABLET BY MOUTH AT BREAKFAST AND AT BEDTIME  (Patient taking differently: 325 mg 2 (two) times daily with a meal.), Disp: 60 tablet, Rfl: 0   haloperidol (HALDOL) 5 MG tablet, Take 5 mg by mouth at bedtime., Disp: , Rfl:    lamoTRIgine (LAMICTAL) 100 MG tablet, Take 100 mg by mouth daily., Disp: , Rfl:    levocetirizine (XYZAL) 5 MG tablet, TAKE 1 TABLET BY MOUTH EVERY EVENING, Disp: 90 tablet, Rfl: 3   methocarbamol (ROBAXIN) 500 MG tablet, Take 500 mg by mouth every 8 (eight) hours as needed., Disp: , Rfl:    metoprolol succinate (TOPROL-XL) 25 MG 24 hr tablet, Take 25 mg by mouth daily., Disp: , Rfl:    Multiple Vitamins-Minerals (MULTIVITAMIN WITH MINERALS) tablet, Take 1 tablet by mouth daily., Disp: , Rfl:    oxyCODONE-acetaminophen (PERCOCET) 10-325 MG tablet, Take 1 tablet by mouth every 6 (six) hours as needed for pain. Take one tablet by mouth up to 4 times daily as needed., Disp: 30 tablet, Rfl: 0   pantoprazole (PROTONIX) 40 MG tablet, TAKE ONE (1) TABLET BY MOUTH TWICE DAILY (Patient taking differently: Take 40 mg by mouth 2 (two) times daily.), Disp: 180 tablet, Rfl: 1   polyethylene glycol (MIRALAX / GLYCOLAX) 17 g packet, Take 17 g by mouth daily., Disp: ,  Rfl:    prazosin (MINIPRESS) 2 MG capsule, Take 2 mg by mouth at bedtime., Disp: , Rfl:    pregabalin (LYRICA) 100 MG capsule, Take 1 capsule (100 mg total) by mouth 3 (three) times daily., Disp: 30 capsule, Rfl: 0   senna-docusate (SENOKOT-S) 8.6-50 MG tablet, Take 1 tablet by mouth at bedtime as needed for mild constipation., Disp: , Rfl:    solifenacin (VESICARE) 10 MG tablet, TAKE 1 TABLET BY MOUTH ONCE DAILY, Disp: 90 tablet, Rfl: 1   SUMAtriptan (IMITREX) 50 MG tablet, Take 50 mg by mouth every 2 (two) hours as needed for migraine., Disp: , Rfl:    topiramate (TOPAMAX) 50 MG tablet, TAKE ONE (1) TABLET BY MOUTH TWICE DAILY (Patient taking differently: Take 50 mg by mouth 2 (two) times daily.), Disp: 180 tablet, Rfl: 1   traZODone (DESYREL) 100 MG tablet, Take 1 tablet  (100 mg total) by mouth at bedtime. (Patient taking differently: Take 200 mg by mouth at bedtime. 2 tablets at bedtime.), Disp: 30 tablet, Rfl: 0  Social History   Tobacco Use  Smoking Status Never  Smokeless Tobacco Never    Allergies  Allergen Reactions   Tetracyclines & Related Other (See Comments)    Syncope and put her "in a coma"   Tramadol Other (See Comments)    Seizures   Oxybutynin Other (See Comments)    Unknown reaction per Mom   Phenazopyridine Other (See Comments)    Unknown reaction   Ciprofloxacin Rash and Itching   Codeine Itching and Rash   Estradiol Rash    Patches broke out the skin   Objective:  There were no vitals filed for this visit. There is no height or weight on file to calculate BMI. Constitutional Well developed. Well nourished.  Vascular Foot warm and well perfused. Capillary refill normal to all digits.   Neurologic Normal speech. Oriented to person, place, and time. Epicritic sensation to light touch grossly present bilaterally.  Dermatologic Skin healing well without signs of infection. Skin edges well coapted without signs of infection. To latera and plantar inicision. Wound dehiscence noted to medial incision about 1 cm x 0.3 cm x 0.3 cm with granular base. No erythema edema or purulence noted.   Orthopedic: Tenderness to palpation noted about the surgical site.   Radiographs: No osseous erosions noted Interval resection of metatarsals.  No growth of cultures and pathology negative for osteomyelitis  Assessment:   1. Wound dehiscence   2. History of amputation of right midfoot ([name])    Plan:  Patient was evaluated and treated and all questions answered.  S/p foot surgery right -Progressing well. Wound dehiscence present.  Ulcer medial incision distal TMA site with fat layer exposed  -Debridement of dehiscence site.  -Dressed with betadine , DSD. -Off-loading with CAM boot  -No abx indicated.  -Discussed glucose control and  proper protein-rich diet.  -Discussed if any worsening redness, pain, fever or chills to call or may need to report to the emergency room. Patient expressed understanding.   Return in 2 weeks for recheck.    Return in about 3 weeks (around 04/19/2023) for wound check.   Return in about 3 weeks (around 04/19/2023) for wound check.

## 2023-03-31 DIAGNOSIS — N182 Chronic kidney disease, stage 2 (mild): Secondary | ICD-10-CM | POA: Diagnosis not present

## 2023-03-31 DIAGNOSIS — J45909 Unspecified asthma, uncomplicated: Secondary | ICD-10-CM | POA: Diagnosis not present

## 2023-03-31 DIAGNOSIS — K219 Gastro-esophageal reflux disease without esophagitis: Secondary | ICD-10-CM | POA: Diagnosis not present

## 2023-03-31 DIAGNOSIS — T8743 Infection of amputation stump, right lower extremity: Secondary | ICD-10-CM | POA: Diagnosis not present

## 2023-03-31 DIAGNOSIS — M869 Osteomyelitis, unspecified: Secondary | ICD-10-CM | POA: Diagnosis not present

## 2023-03-31 DIAGNOSIS — Z791 Long term (current) use of non-steroidal anti-inflammatories (NSAID): Secondary | ICD-10-CM | POA: Diagnosis not present

## 2023-03-31 DIAGNOSIS — G894 Chronic pain syndrome: Secondary | ICD-10-CM | POA: Diagnosis not present

## 2023-03-31 DIAGNOSIS — G4733 Obstructive sleep apnea (adult) (pediatric): Secondary | ICD-10-CM | POA: Diagnosis not present

## 2023-03-31 DIAGNOSIS — I5032 Chronic diastolic (congestive) heart failure: Secondary | ICD-10-CM | POA: Diagnosis not present

## 2023-03-31 DIAGNOSIS — I130 Hypertensive heart and chronic kidney disease with heart failure and stage 1 through stage 4 chronic kidney disease, or unspecified chronic kidney disease: Secondary | ICD-10-CM | POA: Diagnosis not present

## 2023-03-31 DIAGNOSIS — R7303 Prediabetes: Secondary | ICD-10-CM | POA: Diagnosis not present

## 2023-03-31 DIAGNOSIS — T8781 Dehiscence of amputation stump: Secondary | ICD-10-CM | POA: Diagnosis not present

## 2023-03-31 DIAGNOSIS — R569 Unspecified convulsions: Secondary | ICD-10-CM | POA: Diagnosis not present

## 2023-04-01 DIAGNOSIS — M869 Osteomyelitis, unspecified: Secondary | ICD-10-CM | POA: Diagnosis not present

## 2023-04-01 DIAGNOSIS — R7303 Prediabetes: Secondary | ICD-10-CM | POA: Diagnosis not present

## 2023-04-01 DIAGNOSIS — K219 Gastro-esophageal reflux disease without esophagitis: Secondary | ICD-10-CM | POA: Diagnosis not present

## 2023-04-01 DIAGNOSIS — T8743 Infection of amputation stump, right lower extremity: Secondary | ICD-10-CM | POA: Diagnosis not present

## 2023-04-01 DIAGNOSIS — J45909 Unspecified asthma, uncomplicated: Secondary | ICD-10-CM | POA: Diagnosis not present

## 2023-04-01 DIAGNOSIS — G894 Chronic pain syndrome: Secondary | ICD-10-CM | POA: Diagnosis not present

## 2023-04-01 DIAGNOSIS — G4733 Obstructive sleep apnea (adult) (pediatric): Secondary | ICD-10-CM | POA: Diagnosis not present

## 2023-04-01 DIAGNOSIS — N182 Chronic kidney disease, stage 2 (mild): Secondary | ICD-10-CM | POA: Diagnosis not present

## 2023-04-01 DIAGNOSIS — R569 Unspecified convulsions: Secondary | ICD-10-CM | POA: Diagnosis not present

## 2023-04-01 DIAGNOSIS — Z791 Long term (current) use of non-steroidal anti-inflammatories (NSAID): Secondary | ICD-10-CM | POA: Diagnosis not present

## 2023-04-01 DIAGNOSIS — T8781 Dehiscence of amputation stump: Secondary | ICD-10-CM | POA: Diagnosis not present

## 2023-04-01 DIAGNOSIS — I130 Hypertensive heart and chronic kidney disease with heart failure and stage 1 through stage 4 chronic kidney disease, or unspecified chronic kidney disease: Secondary | ICD-10-CM | POA: Diagnosis not present

## 2023-04-01 DIAGNOSIS — I5032 Chronic diastolic (congestive) heart failure: Secondary | ICD-10-CM | POA: Diagnosis not present

## 2023-04-04 ENCOUNTER — Encounter: Payer: Self-pay | Admitting: Family Medicine

## 2023-04-04 ENCOUNTER — Ambulatory Visit (INDEPENDENT_AMBULATORY_CARE_PROVIDER_SITE_OTHER): Payer: 59 | Admitting: Family Medicine

## 2023-04-04 VITALS — BP 126/72 | HR 69 | Temp 97.8°F | Ht 64.25 in | Wt 239.0 lb

## 2023-04-04 DIAGNOSIS — M869 Osteomyelitis, unspecified: Secondary | ICD-10-CM | POA: Diagnosis not present

## 2023-04-04 DIAGNOSIS — T8781 Dehiscence of amputation stump: Secondary | ICD-10-CM | POA: Diagnosis not present

## 2023-04-04 DIAGNOSIS — R7303 Prediabetes: Secondary | ICD-10-CM | POA: Diagnosis not present

## 2023-04-04 DIAGNOSIS — D649 Anemia, unspecified: Secondary | ICD-10-CM

## 2023-04-04 DIAGNOSIS — M86171 Other acute osteomyelitis, right ankle and foot: Secondary | ICD-10-CM | POA: Diagnosis not present

## 2023-04-04 DIAGNOSIS — I5032 Chronic diastolic (congestive) heart failure: Secondary | ICD-10-CM | POA: Diagnosis not present

## 2023-04-04 DIAGNOSIS — G4733 Obstructive sleep apnea (adult) (pediatric): Secondary | ICD-10-CM | POA: Diagnosis not present

## 2023-04-04 DIAGNOSIS — K219 Gastro-esophageal reflux disease without esophagitis: Secondary | ICD-10-CM | POA: Diagnosis not present

## 2023-04-04 DIAGNOSIS — J45909 Unspecified asthma, uncomplicated: Secondary | ICD-10-CM | POA: Diagnosis not present

## 2023-04-04 DIAGNOSIS — N182 Chronic kidney disease, stage 2 (mild): Secondary | ICD-10-CM | POA: Diagnosis not present

## 2023-04-04 DIAGNOSIS — R569 Unspecified convulsions: Secondary | ICD-10-CM | POA: Diagnosis not present

## 2023-04-04 DIAGNOSIS — I130 Hypertensive heart and chronic kidney disease with heart failure and stage 1 through stage 4 chronic kidney disease, or unspecified chronic kidney disease: Secondary | ICD-10-CM | POA: Diagnosis not present

## 2023-04-04 DIAGNOSIS — G894 Chronic pain syndrome: Secondary | ICD-10-CM | POA: Diagnosis not present

## 2023-04-04 DIAGNOSIS — T8743 Infection of amputation stump, right lower extremity: Secondary | ICD-10-CM | POA: Diagnosis not present

## 2023-04-04 DIAGNOSIS — Z791 Long term (current) use of non-steroidal anti-inflammatories (NSAID): Secondary | ICD-10-CM | POA: Diagnosis not present

## 2023-04-04 NOTE — Progress Notes (Signed)
Subjective:    Patient ID: [name] [name], female    DOB: 10/18/1965, 57 y.o.   MRN: [id number]  Patient was recently admitted to the hospital in early November for osteomyelitis and right foot.  She underwent incision and drainage and surgical treatment of the osteomyelitis and then was treated with 3 weeks of Bactrim.  She is here today in a cam walker and in a wheelchair.  She has been made nonweightbearing.  I removed her dressing.  The stump today appears nonerythematous.  There is no drainage.  There is a small dehiscence of the wound located roughly where the second MTP joint will be located.  The patient has a history of/transmetatarsal amputation.  The dehiscence is superficial.  It is 4 mm in diameter.  There is no drainage.  Podiatry is aware and is treating the patient by making her nonweightbearing.  Wound care is dressing the wound daily.  Patient denies any pain in her feet.  Of note, in the hospital, her hemoglobin dropped below 8.  She is not currently taking any iron supplement.  She was scheduled to see GI and was supposed to have a colonoscopy however this never occurred due to her recent hospitalization Past Medical History:  Diagnosis Date   Acute metabolic encephalopathy 06/08/2022   AMS (altered mental status) 05/22/2022   Anxiety    Arthritis    Phreesia 02/08/2020   Asthma    mild intermittent   Asthma    Phreesia 02/08/2020   Bipolar 1 disorder ([name])    ect treatments last treatment Sep 02 1011   Depression    Depression    Phreesia 02/08/2020   Depression    Phreesia 07/10/2020   GERD (gastroesophageal reflux disease)    Headache    Hypertension    Pre-diabetes    Seizures ([name])    Thinks it was from taking Tramadol   Septic shock ([name]) 05/22/2022   Shortness of breath 09/23/2016   Overview:   exertional   Sleep apnea    AHI 40 (2024)   Substance abuse ([name])    Phreesia 02/08/2020   Syncope 08/26/2019   Syncope and collapse    Past Surgical  History:  Procedure Laterality Date   ABDOMINAL HYSTERECTOMY     AMPUTATION Right 02/19/2023   Procedure: AMPUTATION FOOT, revision and Incision and drainage;  Surgeon: Louann Sjogren, DPM;  Location: [facility];  Service: Orthopedics/Podiatry;  Laterality: Right;   AMPUTATION TOE Left 02/02/2018   Procedure: AMPUTATION TOE Left 4th toe;  Surgeon: Vivi Barrack, DPM;  Location: [facility] OR;  Service: Podiatry;  Laterality: Left;   APPENDECTOMY N/A    Phreesia 02/08/2020   BACK SURGERY  2018   ACDF   C5-6 & C6-7 by Dr. Sharolyn Douglas   CARPAL TUNNEL RELEASE     x2   I & D EXTREMITY Right 05/22/2022   Procedure: IRRIGATION AND DEBRIDEMENT EXTREMITY;  Surgeon: Candelaria Stagers, DPM;  Location: [facility] ORS;  Service: Podiatry;  Laterality: Right;   INCISION AND DRAINAGE Right 05/24/2022   Procedure: RIGHT REVISION INCISION AND DRAINAGE/ WASHOUT WITH PRIMARY DELAYED CLOSURE;  Surgeon: Edwin Cap, DPM;  Location: [facility] OR[facility];  Service: Podiatry;  Laterality: Right;   IRRIGATION AND DEBRIDEMENT ABSCESS Right 10/25/2020   Procedure: IRRIGATION AND DEBRIDEMENT ABSCESS OF FOOT AND APPLICATION OF GRAFT;  Surgeon: Vivi Barrack, DPM;  Location: [facility];  Service: Podiatry;  Laterality: Right;   Laproscopic knee surgery Right    NECK  SURGERY  03/15/2017   ACDF   by Dr. Farrel Gordon FASCIA RELEASE     x2   REVERSE SHOULDER ARTHROPLASTY Right 04/14/2022   Procedure: REVERSE SHOULDER ARTHROPLASTY;  Surgeon: Bjorn Pippin, MD;  Location: [facility];  Service: Orthopedics;  Laterality: Right;   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   Procedure: TRANSMETATARSAL AMPUTATION;  Surgeon: Vivi Barrack, DPM;  Location: [facility];  Service: Podiatry;  Laterality: Right;   TRANSMETATARSAL AMPUTATION Right 05/27/2022   Procedure: REVISION TRANSMETATARSAL AMPUTATION WITH DELAYED PRIMARY CLOSURE;  Surgeon: Felecia Shelling, DPM;  Location: [facility];  Service: Podiatry;  Laterality: Right;   Current Outpatient Medications on  File Prior to Visit  Medication Sig Dispense Refill   busPIRone (BUSPAR) 15 MG tablet Take 15 mg by mouth 3 (three) times daily.   2   Calcium Carbonate-Vit D-Min (CALCIUM 1200 PO) Take 1,200 mg by mouth daily.      celecoxib (CELEBREX) 100 MG capsule Take 100 mg by mouth 2 (two) times daily as needed.     DULoxetine (CYMBALTA) 60 MG capsule Take 60 mg by mouth daily.     Erenumab-aooe (AIMOVIG) 70 MG/ML SOAJ administer 1mL UNDER THE SKIN every 30 DAYS 1 mL 5   escitalopram (LEXAPRO) 10 MG tablet Take 10 mg by mouth daily.     estradiol (ESTRACE) 1 MG tablet TAKE ONE TABLET BY MOUTH EVERYDAY AT BEDTIME (Patient taking differently: Take 1 mg by mouth at bedtime.) 90 tablet 3   famotidine (PEPCID) 40 MG tablet TAKE 1 TABLET BY MOUTH EVERY DAY AT BEDTIME 90 tablet 1   ferrous sulfate (FEROSUL) 325 (65 FE) MG tablet TAKE 1 TABLET BY MOUTH AT BREAKFAST AND AT BEDTIME (Patient taking differently: 325 mg 2 (two) times daily with a meal.) 60 tablet 0   haloperidol (HALDOL) 5 MG tablet Take 5 mg by mouth at bedtime.     lamoTRIgine (LAMICTAL) 100 MG tablet Take 100 mg by mouth daily.     levocetirizine (XYZAL) 5 MG tablet TAKE 1 TABLET BY MOUTH EVERY EVENING 90 tablet 3   methocarbamol (ROBAXIN) 500 MG tablet Take 500 mg by mouth every 8 (eight) hours as needed.     metoprolol succinate (TOPROL-XL) 25 MG 24 hr tablet Take 25 mg by mouth daily.     Multiple Vitamins-Minerals (MULTIVITAMIN WITH MINERALS) tablet Take 1 tablet by mouth daily.     oxyCODONE-acetaminophen (PERCOCET) 10-325 MG tablet Take 1 tablet by mouth every 6 (six) hours as needed for pain. Take one tablet by mouth up to 4 times daily as needed. 30 tablet 0   pantoprazole (PROTONIX) 40 MG tablet TAKE ONE (1) TABLET BY MOUTH TWICE DAILY (Patient taking differently: Take 40 mg by mouth 2 (two) times daily.) 180 tablet 1   polyethylene glycol (MIRALAX / GLYCOLAX) 17 g packet Take 17 g by mouth daily.     prazosin (MINIPRESS) 2 MG capsule Take  2 mg by mouth at bedtime.     pregabalin (LYRICA) 100 MG capsule Take 1 capsule (100 mg total) by mouth 3 (three) times daily. 30 capsule 0   senna-docusate (SENOKOT-S) 8.6-50 MG tablet Take 1 tablet by mouth at bedtime as needed for mild constipation.     solifenacin (VESICARE) 10 MG tablet TAKE 1 TABLET BY MOUTH ONCE DAILY 90 tablet 1   SUMAtriptan (IMITREX) 50 MG tablet Take 50 mg by mouth every 2 (two) hours as needed for migraine.     topiramate (TOPAMAX) 50  MG tablet TAKE ONE (1) TABLET BY MOUTH TWICE DAILY (Patient taking differently: Take 50 mg by mouth 2 (two) times daily.) 180 tablet 1   traZODone (DESYREL) 100 MG tablet Take 1 tablet (100 mg total) by mouth at bedtime. (Patient taking differently: Take 200 mg by mouth at bedtime. 2 tablets at bedtime.) 30 tablet 0   No current facility-administered medications on file prior to visit.   Allergies  Allergen Reactions   Tetracyclines & Related Other (See Comments)    Syncope and put her "in a coma"   Tramadol Other (See Comments)    Seizures   Oxybutynin Other (See Comments)    Unknown reaction per Mom   Phenazopyridine Other (See Comments)    Unknown reaction   Ciprofloxacin Rash and Itching   Codeine Itching and Rash   Estradiol Rash    Patches broke out the skin   Social History   Socioeconomic History   Marital status: Widowed    Spouse name: Not on file   Number of children: 3   Years of education: 94   Highest education level: Not on file  Occupational History   Occupation: Disabled  Tobacco Use   Smoking status: Never   Smokeless tobacco: Never  Vaping Use   Vaping status: Never Used  Substance and Sexual Activity   Alcohol use: No   Drug use: No   Sexual activity: Not Currently  Other Topics Concern   Not on file  Social History Narrative   Patient drink 4 16oz caffeine drinks a day    Social Drivers of Corporate investment banker Strain: Low Risk  (07/27/2022)   Overall Financial Resource Strain  (CARDIA)    Difficulty of Paying Living Expenses: Not hard at all  Food Insecurity: No Food Insecurity (02/18/2023)   Hunger Vital Sign    Worried About Running Out of Food in the Last Year: Never true    Ran Out of Food in the Last Year: Never true  Transportation Needs: No Transportation Needs (02/18/2023)   PRAPARE - Administrator, Civil Service (Medical): No    Lack of Transportation (Non-Medical): No  Physical Activity: Inactive (07/26/2022)   Exercise Vital Sign    Days of Exercise per Week: 0 days    Minutes of Exercise per Session: 0 min  Stress: No Stress Concern Present (11/19/2022)   [employer] of Occupational Health - Occupational Stress Questionnaire    Feeling of Stress : Not at all  Social Connections: Unknown (08/30/2021)   Received from [facility]   Social Network    Social Network: Not on file  Intimate Partner Violence: Not At Risk (02/18/2023)   Humiliation, Afraid, Rape, and Kick questionnaire    Fear of Current or Ex-Partner: No    Emotionally Abused: No    Physically Abused: No    Sexually Abused: No   Family History  Problem Relation Age of Onset   Diabetes Mother    COPD Mother    Hypertension Mother    Hyperlipidemia Mother    Sleep apnea Mother    Heart disease Father    Hyperlipidemia Father    Hypertension Father    Cancer Father    Sleep apnea Father    Heart disease Brother    Sleep apnea Brother    Heart disease Maternal Grandmother    Hypertension Maternal Grandmother    Sleep apnea Maternal Grandmother    Heart disease Maternal Grandfather  Kidney cancer Paternal Grandmother    Heart disease Paternal Grandfather    Heart disease Daughter       Review of Systems  All other systems reviewed and are negative.      Objective:   Physical Exam Vitals reviewed.  Constitutional:      General: She is not in acute distress.    Appearance: She is well-developed. She is not diaphoretic.  HENT:      Head: Normocephalic and atraumatic.     Right Ear: External ear normal.     Left Ear: External ear normal.  Neck:     Thyroid: No thyromegaly.     Trachea: No tracheal deviation.  Cardiovascular:     Rate and Rhythm: Normal rate and regular rhythm.     Heart sounds: Normal heart sounds. No murmur heard.    No friction rub. No gallop.  Pulmonary:     Effort: Pulmonary effort is normal. No respiratory distress.     Breath sounds: No stridor. No decreased breath sounds, wheezing, rhonchi or rales.  Chest:     Chest wall: No tenderness.  Abdominal:     Palpations: Abdomen is soft.  Musculoskeletal:     Cervical back: Normal range of motion and neck supple.  Lymphadenopathy:     Cervical: No cervical adenopathy.  Skin:    General: Skin is warm.     Coloration: Skin is not pale.     Findings: Erythema and lesion present. No rash.  Neurological:     Mental Status: She is alert and oriented to person, place, and time.     Cranial Nerves: No cranial nerve deficit.     Motor: No abnormal muscle tone.     Coordination: Coordination normal.     Deep Tendon Reflexes: Reflexes are normal and symmetric.   There is no erythema over the stump at the transmetatarsal amputation of the right foot.  There is a small 4 mm dehiscence of the skin near the second MTP joint area.  No evidence of cellulitis or osteomyelitis today on exam.  This is being treated by making the patient nonweightbearing.  Wound care history of the patient daily       Assessment & Plan:  Anemia, unspecified type - Plan: CBC with Differential/Platelet, COMPLETE METABOLIC PANEL WITH GFR, Ferritin, Iron  Other acute osteomyelitis of right foot ([name]) Osteomyelitis is clinically resolved.  There is no erythema or drainage.  Recommended the patient continue to be nonweightbearing and wear the cam walker daily.  Recommended she follow-up as planned with podiatry.  Wound care is seeing the patient later today.  They are coming out  to her home and dressing a daily basis.  I redressed the wound today by covering the stump with nonadherent gauze and then wrapping the leg with clean Kerlix.  I will follow-up on the patient's anemia by checking a CBC a CMP and a ferritin level.  If the iron level is low would recommend ferrous sulfate 325 mg daily.  Would also recommend the patient follow-up as planned with GI

## 2023-04-05 LAB — CBC WITH DIFFERENTIAL/PLATELET
Absolute Lymphocytes: 2318 {cells}/uL (ref 850–3900)
Absolute Monocytes: 323 {cells}/uL (ref 200–950)
Basophils Absolute: 20 {cells}/uL (ref 0–200)
Basophils Relative: 0.4 %
Eosinophils Absolute: 152 {cells}/uL (ref 15–500)
Eosinophils Relative: 3.1 %
HCT: 32 % — ABNORMAL LOW (ref 35.0–45.0)
Hemoglobin: 10.4 g/dL — ABNORMAL LOW (ref 11.7–15.5)
MCH: 31.5 pg (ref 27.0–33.0)
MCHC: 32.5 g/dL (ref 32.0–36.0)
MCV: 97 fL (ref 80.0–100.0)
MPV: 9.5 fL (ref 7.5–12.5)
Monocytes Relative: 6.6 %
Neutro Abs: 2087 {cells}/uL (ref 1500–7800)
Neutrophils Relative %: 42.6 %
Platelets: 274 10*3/uL (ref 140–400)
RBC: 3.3 10*6/uL — ABNORMAL LOW (ref 3.80–5.10)
RDW: 13.1 % (ref 11.0–15.0)
Total Lymphocyte: 47.3 %
WBC: 4.9 10*3/uL (ref 3.8–10.8)

## 2023-04-05 LAB — COMPLETE METABOLIC PANEL WITH GFR
AG Ratio: 1.3 (calc) (ref 1.0–2.5)
ALT: 8 U/L (ref 6–29)
AST: 9 U/L — ABNORMAL LOW (ref 10–35)
Albumin: 3.8 g/dL (ref 3.6–5.1)
Alkaline phosphatase (APISO): 82 U/L (ref 37–153)
BUN: 14 mg/dL (ref 7–25)
CO2: 26 mmol/L (ref 20–32)
Calcium: 9.7 mg/dL (ref 8.6–10.4)
Chloride: 105 mmol/L (ref 98–110)
Creat: 1.01 mg/dL (ref 0.50–1.03)
Globulin: 3 g/dL (ref 1.9–3.7)
Glucose, Bld: 99 mg/dL (ref 65–99)
Potassium: 4 mmol/L (ref 3.5–5.3)
Sodium: 141 mmol/L (ref 135–146)
Total Bilirubin: 0.2 mg/dL (ref 0.2–1.2)
Total Protein: 6.8 g/dL (ref 6.1–8.1)
eGFR: 64 mL/min/{1.73_m2} (ref >=60)

## 2023-04-05 LAB — IRON: Iron: 45 ug/dL (ref 45–160)

## 2023-04-05 LAB — FERRITIN: Ferritin: 71 ng/mL (ref 16–232)

## 2023-04-06 DIAGNOSIS — R7303 Prediabetes: Secondary | ICD-10-CM | POA: Diagnosis not present

## 2023-04-06 DIAGNOSIS — G8929 Other chronic pain: Secondary | ICD-10-CM | POA: Diagnosis not present

## 2023-04-06 DIAGNOSIS — Z791 Long term (current) use of non-steroidal anti-inflammatories (NSAID): Secondary | ICD-10-CM | POA: Diagnosis not present

## 2023-04-06 DIAGNOSIS — I130 Hypertensive heart and chronic kidney disease with heart failure and stage 1 through stage 4 chronic kidney disease, or unspecified chronic kidney disease: Secondary | ICD-10-CM | POA: Diagnosis not present

## 2023-04-06 DIAGNOSIS — T8781 Dehiscence of amputation stump: Secondary | ICD-10-CM | POA: Diagnosis not present

## 2023-04-06 DIAGNOSIS — M869 Osteomyelitis, unspecified: Secondary | ICD-10-CM | POA: Diagnosis not present

## 2023-04-06 DIAGNOSIS — T8743 Infection of amputation stump, right lower extremity: Secondary | ICD-10-CM | POA: Diagnosis not present

## 2023-04-06 DIAGNOSIS — J45909 Unspecified asthma, uncomplicated: Secondary | ICD-10-CM | POA: Diagnosis not present

## 2023-04-06 DIAGNOSIS — N182 Chronic kidney disease, stage 2 (mild): Secondary | ICD-10-CM | POA: Diagnosis not present

## 2023-04-06 DIAGNOSIS — G894 Chronic pain syndrome: Secondary | ICD-10-CM | POA: Diagnosis not present

## 2023-04-06 DIAGNOSIS — I5032 Chronic diastolic (congestive) heart failure: Secondary | ICD-10-CM | POA: Diagnosis not present

## 2023-04-06 DIAGNOSIS — R569 Unspecified convulsions: Secondary | ICD-10-CM | POA: Diagnosis not present

## 2023-04-06 DIAGNOSIS — K219 Gastro-esophageal reflux disease without esophagitis: Secondary | ICD-10-CM | POA: Diagnosis not present

## 2023-04-06 DIAGNOSIS — G4733 Obstructive sleep apnea (adult) (pediatric): Secondary | ICD-10-CM | POA: Diagnosis not present

## 2023-04-06 DIAGNOSIS — M25559 Pain in unspecified hip: Secondary | ICD-10-CM | POA: Diagnosis not present

## 2023-04-06 DIAGNOSIS — Z79891 Long term (current) use of opiate analgesic: Secondary | ICD-10-CM | POA: Diagnosis not present

## 2023-04-06 DIAGNOSIS — M25519 Pain in unspecified shoulder: Secondary | ICD-10-CM | POA: Diagnosis not present

## 2023-04-08 DIAGNOSIS — T8781 Dehiscence of amputation stump: Secondary | ICD-10-CM | POA: Diagnosis not present

## 2023-04-08 DIAGNOSIS — N182 Chronic kidney disease, stage 2 (mild): Secondary | ICD-10-CM | POA: Diagnosis not present

## 2023-04-08 DIAGNOSIS — Z791 Long term (current) use of non-steroidal anti-inflammatories (NSAID): Secondary | ICD-10-CM | POA: Diagnosis not present

## 2023-04-08 DIAGNOSIS — T8743 Infection of amputation stump, right lower extremity: Secondary | ICD-10-CM | POA: Diagnosis not present

## 2023-04-08 DIAGNOSIS — G4733 Obstructive sleep apnea (adult) (pediatric): Secondary | ICD-10-CM | POA: Diagnosis not present

## 2023-04-08 DIAGNOSIS — M869 Osteomyelitis, unspecified: Secondary | ICD-10-CM | POA: Diagnosis not present

## 2023-04-08 DIAGNOSIS — G894 Chronic pain syndrome: Secondary | ICD-10-CM | POA: Diagnosis not present

## 2023-04-08 DIAGNOSIS — I130 Hypertensive heart and chronic kidney disease with heart failure and stage 1 through stage 4 chronic kidney disease, or unspecified chronic kidney disease: Secondary | ICD-10-CM | POA: Diagnosis not present

## 2023-04-08 DIAGNOSIS — R569 Unspecified convulsions: Secondary | ICD-10-CM | POA: Diagnosis not present

## 2023-04-08 DIAGNOSIS — J45909 Unspecified asthma, uncomplicated: Secondary | ICD-10-CM | POA: Diagnosis not present

## 2023-04-08 DIAGNOSIS — R7303 Prediabetes: Secondary | ICD-10-CM | POA: Diagnosis not present

## 2023-04-08 DIAGNOSIS — K219 Gastro-esophageal reflux disease without esophagitis: Secondary | ICD-10-CM | POA: Diagnosis not present

## 2023-04-08 DIAGNOSIS — I5032 Chronic diastolic (congestive) heart failure: Secondary | ICD-10-CM | POA: Diagnosis not present

## 2023-04-11 ENCOUNTER — Other Ambulatory Visit: Payer: Self-pay

## 2023-04-11 ENCOUNTER — Telehealth: Payer: Self-pay

## 2023-04-11 DIAGNOSIS — R569 Unspecified convulsions: Secondary | ICD-10-CM | POA: Diagnosis not present

## 2023-04-11 DIAGNOSIS — N182 Chronic kidney disease, stage 2 (mild): Secondary | ICD-10-CM | POA: Diagnosis not present

## 2023-04-11 DIAGNOSIS — J45909 Unspecified asthma, uncomplicated: Secondary | ICD-10-CM | POA: Diagnosis not present

## 2023-04-11 DIAGNOSIS — G894 Chronic pain syndrome: Secondary | ICD-10-CM | POA: Diagnosis not present

## 2023-04-11 DIAGNOSIS — I5032 Chronic diastolic (congestive) heart failure: Secondary | ICD-10-CM | POA: Diagnosis not present

## 2023-04-11 DIAGNOSIS — R7303 Prediabetes: Secondary | ICD-10-CM | POA: Diagnosis not present

## 2023-04-11 DIAGNOSIS — D649 Anemia, unspecified: Secondary | ICD-10-CM

## 2023-04-11 DIAGNOSIS — Z791 Long term (current) use of non-steroidal anti-inflammatories (NSAID): Secondary | ICD-10-CM | POA: Diagnosis not present

## 2023-04-11 DIAGNOSIS — T8781 Dehiscence of amputation stump: Secondary | ICD-10-CM | POA: Diagnosis not present

## 2023-04-11 DIAGNOSIS — T8743 Infection of amputation stump, right lower extremity: Secondary | ICD-10-CM | POA: Diagnosis not present

## 2023-04-11 DIAGNOSIS — R0602 Shortness of breath: Secondary | ICD-10-CM

## 2023-04-11 DIAGNOSIS — K219 Gastro-esophageal reflux disease without esophagitis: Secondary | ICD-10-CM | POA: Diagnosis not present

## 2023-04-11 DIAGNOSIS — I130 Hypertensive heart and chronic kidney disease with heart failure and stage 1 through stage 4 chronic kidney disease, or unspecified chronic kidney disease: Secondary | ICD-10-CM | POA: Diagnosis not present

## 2023-04-11 DIAGNOSIS — M869 Osteomyelitis, unspecified: Secondary | ICD-10-CM | POA: Diagnosis not present

## 2023-04-11 DIAGNOSIS — G4733 Obstructive sleep apnea (adult) (pediatric): Secondary | ICD-10-CM | POA: Diagnosis not present

## 2023-04-11 MED ORDER — FERROUS SULFATE 325 (65 FE) MG PO TABS: 325.0000 mg | ORAL_TABLET | Freq: Two times a day (BID) | ORAL | 3 refills | Status: AC

## 2023-04-11 NOTE — Telephone Encounter (Signed)
Copied from CRM [id number]. Topic: Clinical - Medication Refill >> Apr 11, 2023  2:50 PM Hector Shade B wrote: Most Recent Primary Care Visit:  Provider: Lynnea Ferrier T  Department: [facility]  Visit Type: HOSPITAL FOLLOW UP  Date: 04/04/2023  Medication:  ferrous sulfate (FEROSUL) 325 (65 FE) MG tablet   Has the patient contacted their pharmacy? No (Agent: If no, request that the patient contact the pharmacy for the refill. If patient does not wish to contact the pharmacy document the reason why and proceed with request.) she was unaware of calling the pharmacy first, but I was able to explain it to her  (Agent: If yes, when and what did the pharmacy advise?)  Is this the correct pharmacy for this prescription? Yes If no, delete pharmacy and type the correct one.  This is the patient's preferred pharmacy:  [id number] [place], [place] - [address] [place] [address] Phone: [phone] Fax: [phone]  [facility] - [address] [place] - [address] [place] [address] Phone: [phone] Fax: [phone]   Has the prescription been filled recently? No  Is the patient out of the medication? Yes  Has the patient been seen for an appointment in the last year OR does the patient have an upcoming appointment? Yes  Can we respond through MyChart? Yes  Agent: Please be advised that Rx refills may take up to 3 business days. We ask that you follow-up with your pharmacy.

## 2023-04-15 DIAGNOSIS — Z791 Long term (current) use of non-steroidal anti-inflammatories (NSAID): Secondary | ICD-10-CM | POA: Diagnosis not present

## 2023-04-15 DIAGNOSIS — M25552 Pain in left hip: Secondary | ICD-10-CM | POA: Diagnosis not present

## 2023-04-15 DIAGNOSIS — G894 Chronic pain syndrome: Secondary | ICD-10-CM | POA: Diagnosis not present

## 2023-04-15 DIAGNOSIS — T8781 Dehiscence of amputation stump: Secondary | ICD-10-CM | POA: Diagnosis not present

## 2023-04-15 DIAGNOSIS — R7303 Prediabetes: Secondary | ICD-10-CM | POA: Diagnosis not present

## 2023-04-15 DIAGNOSIS — T8743 Infection of amputation stump, right lower extremity: Secondary | ICD-10-CM | POA: Diagnosis not present

## 2023-04-15 DIAGNOSIS — R569 Unspecified convulsions: Secondary | ICD-10-CM | POA: Diagnosis not present

## 2023-04-15 DIAGNOSIS — I5032 Chronic diastolic (congestive) heart failure: Secondary | ICD-10-CM | POA: Diagnosis not present

## 2023-04-15 DIAGNOSIS — M25561 Pain in right knee: Secondary | ICD-10-CM | POA: Diagnosis not present

## 2023-04-15 DIAGNOSIS — K219 Gastro-esophageal reflux disease without esophagitis: Secondary | ICD-10-CM | POA: Diagnosis not present

## 2023-04-15 DIAGNOSIS — M25511 Pain in right shoulder: Secondary | ICD-10-CM | POA: Diagnosis not present

## 2023-04-15 DIAGNOSIS — M869 Osteomyelitis, unspecified: Secondary | ICD-10-CM | POA: Diagnosis not present

## 2023-04-15 DIAGNOSIS — J45909 Unspecified asthma, uncomplicated: Secondary | ICD-10-CM | POA: Diagnosis not present

## 2023-04-15 DIAGNOSIS — I130 Hypertensive heart and chronic kidney disease with heart failure and stage 1 through stage 4 chronic kidney disease, or unspecified chronic kidney disease: Secondary | ICD-10-CM | POA: Diagnosis not present

## 2023-04-15 DIAGNOSIS — N182 Chronic kidney disease, stage 2 (mild): Secondary | ICD-10-CM | POA: Diagnosis not present

## 2023-04-15 DIAGNOSIS — M961 Postlaminectomy syndrome, not elsewhere classified: Secondary | ICD-10-CM | POA: Diagnosis not present

## 2023-04-15 DIAGNOSIS — G4733 Obstructive sleep apnea (adult) (pediatric): Secondary | ICD-10-CM | POA: Diagnosis not present

## 2023-04-18 DIAGNOSIS — M869 Osteomyelitis, unspecified: Secondary | ICD-10-CM | POA: Diagnosis not present

## 2023-04-18 DIAGNOSIS — R7303 Prediabetes: Secondary | ICD-10-CM | POA: Diagnosis not present

## 2023-04-18 DIAGNOSIS — G4733 Obstructive sleep apnea (adult) (pediatric): Secondary | ICD-10-CM | POA: Diagnosis not present

## 2023-04-18 DIAGNOSIS — J45909 Unspecified asthma, uncomplicated: Secondary | ICD-10-CM | POA: Diagnosis not present

## 2023-04-18 DIAGNOSIS — T8743 Infection of amputation stump, right lower extremity: Secondary | ICD-10-CM | POA: Diagnosis not present

## 2023-04-18 DIAGNOSIS — Z791 Long term (current) use of non-steroidal anti-inflammatories (NSAID): Secondary | ICD-10-CM | POA: Diagnosis not present

## 2023-04-18 DIAGNOSIS — I130 Hypertensive heart and chronic kidney disease with heart failure and stage 1 through stage 4 chronic kidney disease, or unspecified chronic kidney disease: Secondary | ICD-10-CM | POA: Diagnosis not present

## 2023-04-18 DIAGNOSIS — R569 Unspecified convulsions: Secondary | ICD-10-CM | POA: Diagnosis not present

## 2023-04-18 DIAGNOSIS — K219 Gastro-esophageal reflux disease without esophagitis: Secondary | ICD-10-CM | POA: Diagnosis not present

## 2023-04-18 DIAGNOSIS — N182 Chronic kidney disease, stage 2 (mild): Secondary | ICD-10-CM | POA: Diagnosis not present

## 2023-04-18 DIAGNOSIS — G894 Chronic pain syndrome: Secondary | ICD-10-CM | POA: Diagnosis not present

## 2023-04-18 DIAGNOSIS — T8781 Dehiscence of amputation stump: Secondary | ICD-10-CM | POA: Diagnosis not present

## 2023-04-18 DIAGNOSIS — I5032 Chronic diastolic (congestive) heart failure: Secondary | ICD-10-CM | POA: Diagnosis not present

## 2023-04-19 ENCOUNTER — Telehealth: Payer: Self-pay

## 2023-04-19 ENCOUNTER — Ambulatory Visit: Payer: 59 | Admitting: Podiatry

## 2023-04-19 DIAGNOSIS — N182 Chronic kidney disease, stage 2 (mild): Secondary | ICD-10-CM | POA: Diagnosis not present

## 2023-04-19 DIAGNOSIS — I130 Hypertensive heart and chronic kidney disease with heart failure and stage 1 through stage 4 chronic kidney disease, or unspecified chronic kidney disease: Secondary | ICD-10-CM | POA: Diagnosis not present

## 2023-04-19 DIAGNOSIS — Z791 Long term (current) use of non-steroidal anti-inflammatories (NSAID): Secondary | ICD-10-CM | POA: Diagnosis not present

## 2023-04-19 DIAGNOSIS — T8743 Infection of amputation stump, right lower extremity: Secondary | ICD-10-CM | POA: Diagnosis not present

## 2023-04-19 DIAGNOSIS — G894 Chronic pain syndrome: Secondary | ICD-10-CM | POA: Diagnosis not present

## 2023-04-19 DIAGNOSIS — I5032 Chronic diastolic (congestive) heart failure: Secondary | ICD-10-CM | POA: Diagnosis not present

## 2023-04-19 DIAGNOSIS — G4733 Obstructive sleep apnea (adult) (pediatric): Secondary | ICD-10-CM | POA: Diagnosis not present

## 2023-04-19 DIAGNOSIS — R569 Unspecified convulsions: Secondary | ICD-10-CM | POA: Diagnosis not present

## 2023-04-19 DIAGNOSIS — J45909 Unspecified asthma, uncomplicated: Secondary | ICD-10-CM | POA: Diagnosis not present

## 2023-04-19 DIAGNOSIS — M869 Osteomyelitis, unspecified: Secondary | ICD-10-CM | POA: Diagnosis not present

## 2023-04-19 DIAGNOSIS — K219 Gastro-esophageal reflux disease without esophagitis: Secondary | ICD-10-CM | POA: Diagnosis not present

## 2023-04-19 DIAGNOSIS — R7303 Prediabetes: Secondary | ICD-10-CM | POA: Diagnosis not present

## 2023-04-19 DIAGNOSIS — T8781 Dehiscence of amputation stump: Secondary | ICD-10-CM | POA: Diagnosis not present

## 2023-04-19 NOTE — Telephone Encounter (Signed)
Patient called and left a message - she had to cancel her appointment today due to transportation. She just wants to know if she has to stay in the wheelchair  until she comes in on 1/23

## 2023-04-21 DIAGNOSIS — R569 Unspecified convulsions: Secondary | ICD-10-CM | POA: Diagnosis not present

## 2023-04-21 DIAGNOSIS — T8743 Infection of amputation stump, right lower extremity: Secondary | ICD-10-CM | POA: Diagnosis not present

## 2023-04-21 DIAGNOSIS — T8781 Dehiscence of amputation stump: Secondary | ICD-10-CM | POA: Diagnosis not present

## 2023-04-21 DIAGNOSIS — Z791 Long term (current) use of non-steroidal anti-inflammatories (NSAID): Secondary | ICD-10-CM | POA: Diagnosis not present

## 2023-04-21 DIAGNOSIS — M869 Osteomyelitis, unspecified: Secondary | ICD-10-CM | POA: Diagnosis not present

## 2023-04-21 DIAGNOSIS — K219 Gastro-esophageal reflux disease without esophagitis: Secondary | ICD-10-CM | POA: Diagnosis not present

## 2023-04-21 DIAGNOSIS — R7303 Prediabetes: Secondary | ICD-10-CM | POA: Diagnosis not present

## 2023-04-21 DIAGNOSIS — G4733 Obstructive sleep apnea (adult) (pediatric): Secondary | ICD-10-CM | POA: Diagnosis not present

## 2023-04-21 DIAGNOSIS — G894 Chronic pain syndrome: Secondary | ICD-10-CM | POA: Diagnosis not present

## 2023-04-21 DIAGNOSIS — I5032 Chronic diastolic (congestive) heart failure: Secondary | ICD-10-CM | POA: Diagnosis not present

## 2023-04-21 DIAGNOSIS — I130 Hypertensive heart and chronic kidney disease with heart failure and stage 1 through stage 4 chronic kidney disease, or unspecified chronic kidney disease: Secondary | ICD-10-CM | POA: Diagnosis not present

## 2023-04-21 DIAGNOSIS — N182 Chronic kidney disease, stage 2 (mild): Secondary | ICD-10-CM | POA: Diagnosis not present

## 2023-04-21 DIAGNOSIS — J45909 Unspecified asthma, uncomplicated: Secondary | ICD-10-CM | POA: Diagnosis not present

## 2023-04-22 DIAGNOSIS — J45909 Unspecified asthma, uncomplicated: Secondary | ICD-10-CM | POA: Diagnosis not present

## 2023-04-22 DIAGNOSIS — T8781 Dehiscence of amputation stump: Secondary | ICD-10-CM | POA: Diagnosis not present

## 2023-04-22 DIAGNOSIS — G894 Chronic pain syndrome: Secondary | ICD-10-CM | POA: Diagnosis not present

## 2023-04-22 DIAGNOSIS — R7303 Prediabetes: Secondary | ICD-10-CM | POA: Diagnosis not present

## 2023-04-22 DIAGNOSIS — N182 Chronic kidney disease, stage 2 (mild): Secondary | ICD-10-CM | POA: Diagnosis not present

## 2023-04-22 DIAGNOSIS — G4733 Obstructive sleep apnea (adult) (pediatric): Secondary | ICD-10-CM | POA: Diagnosis not present

## 2023-04-22 DIAGNOSIS — R569 Unspecified convulsions: Secondary | ICD-10-CM | POA: Diagnosis not present

## 2023-04-22 DIAGNOSIS — I5032 Chronic diastolic (congestive) heart failure: Secondary | ICD-10-CM | POA: Diagnosis not present

## 2023-04-22 DIAGNOSIS — T8743 Infection of amputation stump, right lower extremity: Secondary | ICD-10-CM | POA: Diagnosis not present

## 2023-04-22 DIAGNOSIS — M869 Osteomyelitis, unspecified: Secondary | ICD-10-CM | POA: Diagnosis not present

## 2023-04-22 DIAGNOSIS — Z791 Long term (current) use of non-steroidal anti-inflammatories (NSAID): Secondary | ICD-10-CM | POA: Diagnosis not present

## 2023-04-22 DIAGNOSIS — I130 Hypertensive heart and chronic kidney disease with heart failure and stage 1 through stage 4 chronic kidney disease, or unspecified chronic kidney disease: Secondary | ICD-10-CM | POA: Diagnosis not present

## 2023-04-22 DIAGNOSIS — K219 Gastro-esophageal reflux disease without esophagitis: Secondary | ICD-10-CM | POA: Diagnosis not present

## 2023-04-26 DIAGNOSIS — T8781 Dehiscence of amputation stump: Secondary | ICD-10-CM | POA: Diagnosis not present

## 2023-04-26 DIAGNOSIS — Z791 Long term (current) use of non-steroidal anti-inflammatories (NSAID): Secondary | ICD-10-CM | POA: Diagnosis not present

## 2023-04-26 DIAGNOSIS — J45909 Unspecified asthma, uncomplicated: Secondary | ICD-10-CM | POA: Diagnosis not present

## 2023-04-26 DIAGNOSIS — N182 Chronic kidney disease, stage 2 (mild): Secondary | ICD-10-CM | POA: Diagnosis not present

## 2023-04-26 DIAGNOSIS — R7303 Prediabetes: Secondary | ICD-10-CM | POA: Diagnosis not present

## 2023-04-26 DIAGNOSIS — K219 Gastro-esophageal reflux disease without esophagitis: Secondary | ICD-10-CM | POA: Diagnosis not present

## 2023-04-26 DIAGNOSIS — G4733 Obstructive sleep apnea (adult) (pediatric): Secondary | ICD-10-CM | POA: Diagnosis not present

## 2023-04-26 DIAGNOSIS — T8743 Infection of amputation stump, right lower extremity: Secondary | ICD-10-CM | POA: Diagnosis not present

## 2023-04-26 DIAGNOSIS — M869 Osteomyelitis, unspecified: Secondary | ICD-10-CM | POA: Diagnosis not present

## 2023-04-26 DIAGNOSIS — I130 Hypertensive heart and chronic kidney disease with heart failure and stage 1 through stage 4 chronic kidney disease, or unspecified chronic kidney disease: Secondary | ICD-10-CM | POA: Diagnosis not present

## 2023-04-26 DIAGNOSIS — R569 Unspecified convulsions: Secondary | ICD-10-CM | POA: Diagnosis not present

## 2023-04-26 DIAGNOSIS — G894 Chronic pain syndrome: Secondary | ICD-10-CM | POA: Diagnosis not present

## 2023-04-26 DIAGNOSIS — I5032 Chronic diastolic (congestive) heart failure: Secondary | ICD-10-CM | POA: Diagnosis not present

## 2023-04-27 DIAGNOSIS — G4733 Obstructive sleep apnea (adult) (pediatric): Secondary | ICD-10-CM | POA: Diagnosis not present

## 2023-04-27 DIAGNOSIS — T8781 Dehiscence of amputation stump: Secondary | ICD-10-CM | POA: Diagnosis not present

## 2023-04-27 DIAGNOSIS — M869 Osteomyelitis, unspecified: Secondary | ICD-10-CM | POA: Diagnosis not present

## 2023-04-27 DIAGNOSIS — N182 Chronic kidney disease, stage 2 (mild): Secondary | ICD-10-CM | POA: Diagnosis not present

## 2023-04-27 DIAGNOSIS — R569 Unspecified convulsions: Secondary | ICD-10-CM | POA: Diagnosis not present

## 2023-04-27 DIAGNOSIS — I5032 Chronic diastolic (congestive) heart failure: Secondary | ICD-10-CM | POA: Diagnosis not present

## 2023-04-27 DIAGNOSIS — K219 Gastro-esophageal reflux disease without esophagitis: Secondary | ICD-10-CM | POA: Diagnosis not present

## 2023-04-27 DIAGNOSIS — I130 Hypertensive heart and chronic kidney disease with heart failure and stage 1 through stage 4 chronic kidney disease, or unspecified chronic kidney disease: Secondary | ICD-10-CM | POA: Diagnosis not present

## 2023-04-27 DIAGNOSIS — T8743 Infection of amputation stump, right lower extremity: Secondary | ICD-10-CM | POA: Diagnosis not present

## 2023-04-27 DIAGNOSIS — J45909 Unspecified asthma, uncomplicated: Secondary | ICD-10-CM | POA: Diagnosis not present

## 2023-04-27 DIAGNOSIS — G894 Chronic pain syndrome: Secondary | ICD-10-CM | POA: Diagnosis not present

## 2023-04-27 DIAGNOSIS — R7303 Prediabetes: Secondary | ICD-10-CM | POA: Diagnosis not present

## 2023-04-27 DIAGNOSIS — Z791 Long term (current) use of non-steroidal anti-inflammatories (NSAID): Secondary | ICD-10-CM | POA: Diagnosis not present

## 2023-04-29 DIAGNOSIS — K219 Gastro-esophageal reflux disease without esophagitis: Secondary | ICD-10-CM | POA: Diagnosis not present

## 2023-04-29 DIAGNOSIS — T8781 Dehiscence of amputation stump: Secondary | ICD-10-CM | POA: Diagnosis not present

## 2023-04-29 DIAGNOSIS — Z791 Long term (current) use of non-steroidal anti-inflammatories (NSAID): Secondary | ICD-10-CM | POA: Diagnosis not present

## 2023-04-29 DIAGNOSIS — I5032 Chronic diastolic (congestive) heart failure: Secondary | ICD-10-CM | POA: Diagnosis not present

## 2023-04-29 DIAGNOSIS — R569 Unspecified convulsions: Secondary | ICD-10-CM | POA: Diagnosis not present

## 2023-04-29 DIAGNOSIS — G894 Chronic pain syndrome: Secondary | ICD-10-CM | POA: Diagnosis not present

## 2023-04-29 DIAGNOSIS — M545 Low back pain, unspecified: Secondary | ICD-10-CM | POA: Diagnosis not present

## 2023-04-29 DIAGNOSIS — M869 Osteomyelitis, unspecified: Secondary | ICD-10-CM | POA: Diagnosis not present

## 2023-04-29 DIAGNOSIS — R7303 Prediabetes: Secondary | ICD-10-CM | POA: Diagnosis not present

## 2023-04-29 DIAGNOSIS — T8743 Infection of amputation stump, right lower extremity: Secondary | ICD-10-CM | POA: Diagnosis not present

## 2023-04-29 DIAGNOSIS — I130 Hypertensive heart and chronic kidney disease with heart failure and stage 1 through stage 4 chronic kidney disease, or unspecified chronic kidney disease: Secondary | ICD-10-CM | POA: Diagnosis not present

## 2023-04-29 DIAGNOSIS — R531 Weakness: Secondary | ICD-10-CM | POA: Diagnosis not present

## 2023-04-29 DIAGNOSIS — N182 Chronic kidney disease, stage 2 (mild): Secondary | ICD-10-CM | POA: Diagnosis not present

## 2023-04-29 DIAGNOSIS — J45909 Unspecified asthma, uncomplicated: Secondary | ICD-10-CM | POA: Diagnosis not present

## 2023-04-29 DIAGNOSIS — G4733 Obstructive sleep apnea (adult) (pediatric): Secondary | ICD-10-CM | POA: Diagnosis not present

## 2023-05-02 ENCOUNTER — Ambulatory Visit: Payer: 59 | Admitting: Family Medicine

## 2023-05-04 DIAGNOSIS — M25519 Pain in unspecified shoulder: Secondary | ICD-10-CM | POA: Diagnosis not present

## 2023-05-04 DIAGNOSIS — Z79891 Long term (current) use of opiate analgesic: Secondary | ICD-10-CM | POA: Diagnosis not present

## 2023-05-04 DIAGNOSIS — M25559 Pain in unspecified hip: Secondary | ICD-10-CM | POA: Diagnosis not present

## 2023-05-04 DIAGNOSIS — G8929 Other chronic pain: Secondary | ICD-10-CM | POA: Diagnosis not present

## 2023-05-05 ENCOUNTER — Telehealth: Payer: Self-pay

## 2023-05-05 DIAGNOSIS — R569 Unspecified convulsions: Secondary | ICD-10-CM | POA: Diagnosis not present

## 2023-05-05 DIAGNOSIS — G4733 Obstructive sleep apnea (adult) (pediatric): Secondary | ICD-10-CM | POA: Diagnosis not present

## 2023-05-05 DIAGNOSIS — M869 Osteomyelitis, unspecified: Secondary | ICD-10-CM | POA: Diagnosis not present

## 2023-05-05 DIAGNOSIS — T8781 Dehiscence of amputation stump: Secondary | ICD-10-CM | POA: Diagnosis not present

## 2023-05-05 DIAGNOSIS — Z791 Long term (current) use of non-steroidal anti-inflammatories (NSAID): Secondary | ICD-10-CM | POA: Diagnosis not present

## 2023-05-05 DIAGNOSIS — G894 Chronic pain syndrome: Secondary | ICD-10-CM | POA: Diagnosis not present

## 2023-05-05 DIAGNOSIS — N182 Chronic kidney disease, stage 2 (mild): Secondary | ICD-10-CM | POA: Diagnosis not present

## 2023-05-05 DIAGNOSIS — I130 Hypertensive heart and chronic kidney disease with heart failure and stage 1 through stage 4 chronic kidney disease, or unspecified chronic kidney disease: Secondary | ICD-10-CM | POA: Diagnosis not present

## 2023-05-05 DIAGNOSIS — J45909 Unspecified asthma, uncomplicated: Secondary | ICD-10-CM | POA: Diagnosis not present

## 2023-05-05 DIAGNOSIS — I5032 Chronic diastolic (congestive) heart failure: Secondary | ICD-10-CM | POA: Diagnosis not present

## 2023-05-05 DIAGNOSIS — T8743 Infection of amputation stump, right lower extremity: Secondary | ICD-10-CM | POA: Diagnosis not present

## 2023-05-05 DIAGNOSIS — R7303 Prediabetes: Secondary | ICD-10-CM | POA: Diagnosis not present

## 2023-05-05 DIAGNOSIS — K219 Gastro-esophageal reflux disease without esophagitis: Secondary | ICD-10-CM | POA: Diagnosis not present

## 2023-05-05 NOTE — Telephone Encounter (Signed)
Verbal orders given. Mjp,lpn Copied from CRM [id number]. Topic: Clinical - Home Health Verbal Orders >> May 04, 2023  4:58 PM Antony Haste wrote: Caller/Agency: Darral Dash from [facility] Callback Number: [phone] Service Requested: Physical Therapy Frequency: request OT to be moved to this week. Any new concerns about the patient? No

## 2023-05-06 ENCOUNTER — Ambulatory Visit (INDEPENDENT_AMBULATORY_CARE_PROVIDER_SITE_OTHER): Payer: 59 | Admitting: Family Medicine

## 2023-05-06 ENCOUNTER — Telehealth: Payer: Self-pay

## 2023-05-06 VITALS — BP 114/62 | HR 91 | Temp 97.8°F | Ht 64.25 in | Wt 237.0 lb

## 2023-05-06 DIAGNOSIS — D649 Anemia, unspecified: Secondary | ICD-10-CM

## 2023-05-06 DIAGNOSIS — G4710 Hypersomnia, unspecified: Secondary | ICD-10-CM

## 2023-05-06 DIAGNOSIS — G4733 Obstructive sleep apnea (adult) (pediatric): Secondary | ICD-10-CM | POA: Diagnosis not present

## 2023-05-06 MED ORDER — NEOMYCIN-POLYMYXIN-HC 3.5-10000-1 OT SOLN: 3.0000 [drp] | Freq: Four times a day (QID) | OTIC | 1 refills | Status: DC

## 2023-05-06 NOTE — Progress Notes (Signed)
Subjective:    Patient ID: [name] [name], female    DOB: March 01, 1966, 58 y.o.   MRN: [id number]  Patient was recently admitted to the hospital in early November for osteomyelitis and right foot.  She underwent incision and drainage and surgical treatment of the osteomyelitis and then was treated with 3 weeks of Bactrim.  Patient states that ever since this time, she has been dealing with hypersomnolence.  Of note she was complaining of hypersomnolence in March 2024.  At that time her Epworth sleepiness score was 18 out of 24.  Therefore we will refer patient for home sleep study.  I have copied the results below: Sleep Summary:    Total Recording Time (hours, min):9 hours, 54 min   Total Sleep Time (hours, min):           9 hours, 20 min   Percent REM (%):                               26.4%     Respiratory Indices:    Calculated pAHI (per hour):    40.4/hour                        REM pAHI:    46.9/hour                         NREM pAHI:38.2/hour   Central pAHI: 8.5/hour   Oxygen Saturation Statistics:    Oxygen Saturation (%) Mean:            93%        Minimum oxygen saturation (%):                 75%          O2 Saturation Range (%): 75-98%                    O2 Saturation (minutes) <=88%:9.1 min   Pulse Rate Statistics:    Pulse Mean (bpm):                 72/min                Pulse Range (52 - 105/min)       IMPRESSION: OSA (obstructive sleep apnea), severe Central Sleep Apnea    RECOMMENDATION:  This home sleep test demonstrates severe obstructive sleep apnea with a total AHI of 40.4/hour and O2 nadir of 75%.  There was a milder central sleep apnea component.  Mild to moderate snoring was detected.  Treatment with positive airway pressure is highly recommended. This will require - ideally - a full night CPAP titration study for proper treatment settings, O2 monitoring and mask fitting. For now, the patient will be advised to proceed with an autoPAP  titration/trial at home. A laboratory attended titration study can be considered in the future for optimization of treatment settings and to improve tolerance and compliance.  Patient has been hospitalized on several different occasions and this was never able to be followed up upon.  She also has polypharmacy and is taking Haldol, trazodone, methocarbamol, oxycodone, all of which can contribute to hypersomnolence. Past Medical History:  Diagnosis Date   Acute metabolic encephalopathy 06/08/2022   AMS (altered mental status) 05/22/2022   Anxiety    Arthritis    Phreesia 02/08/2020   Asthma    mild intermittent  Asthma    Phreesia 02/08/2020   Bipolar 1 disorder ([name])    ect treatments last treatment Sep 02 1011   Depression    Depression    Phreesia 02/08/2020   Depression    Phreesia 07/10/2020   GERD (gastroesophageal reflux disease)    Headache    Hypertension    Pre-diabetes    Seizures ([name])    Thinks it was from taking Tramadol   Septic shock ([name]) 05/22/2022   Shortness of breath 09/23/2016   Overview:   exertional   Sleep apnea    AHI 40 (2024)   Substance abuse ([name])    Phreesia 02/08/2020   Syncope 08/26/2019   Syncope and collapse    Past Surgical History:  Procedure Laterality Date   ABDOMINAL HYSTERECTOMY     AMPUTATION Right 02/19/2023   Procedure: AMPUTATION FOOT, revision and Incision and drainage;  Surgeon: Louann Sjogren, DPM;  Location: [facility];  Service: Orthopedics/Podiatry;  Laterality: Right;   AMPUTATION TOE Left 02/02/2018   Procedure: AMPUTATION TOE Left 4th toe;  Surgeon: Vivi Barrack, DPM;  Location: [facility] OR;  Service: Podiatry;  Laterality: Left;   APPENDECTOMY N/A    Phreesia 02/08/2020   BACK SURGERY  2018   ACDF   C5-6 & C6-7 by Dr. Sharolyn Douglas   CARPAL TUNNEL RELEASE     x2   I & D EXTREMITY Right 05/22/2022   Procedure: IRRIGATION AND DEBRIDEMENT EXTREMITY;  Surgeon: Candelaria Stagers, DPM;  Location: [facility] ORS;  Service: Podiatry;   Laterality: Right;   INCISION AND DRAINAGE Right 05/24/2022   Procedure: RIGHT REVISION INCISION AND DRAINAGE/ WASHOUT WITH PRIMARY DELAYED CLOSURE;  Surgeon: Edwin Cap, DPM;  Location: [facility] OR[facility];  Service: Podiatry;  Laterality: Right;   IRRIGATION AND DEBRIDEMENT ABSCESS Right 10/25/2020   Procedure: IRRIGATION AND DEBRIDEMENT ABSCESS OF FOOT AND APPLICATION OF GRAFT;  Surgeon: Vivi Barrack, DPM;  Location: [facility] OR;  Service: Podiatry;  Laterality: Right;   Laproscopic knee surgery Right    NECK SURGERY  03/15/2017   ACDF   by Dr. Noel Gerold   PLANTAR FASCIA RELEASE     x2   REVERSE SHOULDER ARTHROPLASTY Right 04/14/2022   Procedure: REVERSE SHOULDER ARTHROPLASTY;  Surgeon: Bjorn Pippin, MD;  Location: [facility];  Service: Orthopedics;  Laterality: Right;   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   Procedure: TRANSMETATARSAL AMPUTATION;  Surgeon: Vivi Barrack, DPM;  Location: [facility];  Service: Podiatry;  Laterality: Right;   TRANSMETATARSAL AMPUTATION Right 05/27/2022   Procedure: REVISION TRANSMETATARSAL AMPUTATION WITH DELAYED PRIMARY CLOSURE;  Surgeon: Felecia Shelling, DPM;  Location: [facility];  Service: Podiatry;  Laterality: Right;   Current Outpatient Medications on File Prior to Visit  Medication Sig Dispense Refill   busPIRone (BUSPAR) 15 MG tablet Take 15 mg by mouth 3 (three) times daily.   2   Calcium Carbonate-Vit D-Min (CALCIUM 1200 PO) Take 1,200 mg by mouth daily.      celecoxib (CELEBREX) 100 MG capsule Take 100 mg by mouth 2 (two) times daily as needed.     DULoxetine (CYMBALTA) 60 MG capsule Take 60 mg by mouth daily.     Erenumab-aooe (AIMOVIG) 70 MG/ML SOAJ administer 1mL UNDER THE SKIN every 30 DAYS 1 mL 5   escitalopram (LEXAPRO) 10 MG tablet Take 10 mg by mouth daily.     estradiol (ESTRACE) 1 MG tablet TAKE ONE TABLET BY MOUTH EVERYDAY AT BEDTIME (Patient taking differently: Take 1 mg by mouth  at bedtime.) 90 tablet 3   famotidine (PEPCID) 40 MG tablet TAKE  1 TABLET BY MOUTH EVERY DAY AT BEDTIME 90 tablet 1   ferrous sulfate (FEROSUL) 325 (65 FE) MG tablet Take 1 tablet (325 mg total) by mouth 2 (two) times daily with a meal. TAKE 1 TABLET BY MOUTH AT BREAKFAST AND AT BEDTIME 60 tablet 3   haloperidol (HALDOL) 5 MG tablet Take 5 mg by mouth at bedtime.     lamoTRIgine (LAMICTAL) 100 MG tablet Take 100 mg by mouth daily.     levocetirizine (XYZAL) 5 MG tablet TAKE 1 TABLET BY MOUTH EVERY EVENING 90 tablet 3   methocarbamol (ROBAXIN) 500 MG tablet Take 500 mg by mouth every 8 (eight) hours as needed.     metoprolol succinate (TOPROL-XL) 25 MG 24 hr tablet Take 25 mg by mouth daily.     Multiple Vitamins-Minerals (MULTIVITAMIN WITH MINERALS) tablet Take 1 tablet by mouth daily.     oxyCODONE-acetaminophen (PERCOCET) 10-325 MG tablet Take 1 tablet by mouth every 6 (six) hours as needed for pain. Take one tablet by mouth up to 4 times daily as needed. 30 tablet 0   pantoprazole (PROTONIX) 40 MG tablet TAKE ONE (1) TABLET BY MOUTH TWICE DAILY (Patient taking differently: Take 40 mg by mouth 2 (two) times daily.) 180 tablet 1   polyethylene glycol (MIRALAX / GLYCOLAX) 17 g packet Take 17 g by mouth daily.     prazosin (MINIPRESS) 2 MG capsule Take 2 mg by mouth at bedtime.     pregabalin (LYRICA) 100 MG capsule Take 1 capsule (100 mg total) by mouth 3 (three) times daily. 30 capsule 0   senna-docusate (SENOKOT-S) 8.6-50 MG tablet Take 1 tablet by mouth at bedtime as needed for mild constipation.     solifenacin (VESICARE) 10 MG tablet TAKE 1 TABLET BY MOUTH ONCE DAILY 90 tablet 1   SUMAtriptan (IMITREX) 50 MG tablet Take 50 mg by mouth every 2 (two) hours as needed for migraine.     topiramate (TOPAMAX) 50 MG tablet TAKE ONE (1) TABLET BY MOUTH TWICE DAILY (Patient taking differently: Take 50 mg by mouth 2 (two) times daily.) 180 tablet 1   traZODone (DESYREL) 100 MG tablet Take 1 tablet (100 mg total) by mouth at bedtime. (Patient taking differently: Take  200 mg by mouth at bedtime. 2 tablets at bedtime.) 30 tablet 0   No current facility-administered medications on file prior to visit.   Allergies  Allergen Reactions   Tetracyclines & Related Other (See Comments)    Syncope and put her "in a coma"   Tramadol Other (See Comments)    Seizures   Oxybutynin Other (See Comments)    Unknown reaction per Mom   Phenazopyridine Other (See Comments)    Unknown reaction   Ciprofloxacin Rash and Itching   Codeine Itching and Rash   Estradiol Rash    Patches broke out the skin   Social History   Socioeconomic History   Marital status: Widowed    Spouse name: Not on file   Number of children: 3   Years of education: 75   Highest education level: Not on file  Occupational History   Occupation: Disabled  Tobacco Use   Smoking status: Never   Smokeless tobacco: Never  Vaping Use   Vaping status: Never Used  Substance and Sexual Activity   Alcohol use: No   Drug use: No   Sexual activity: Not Currently  Other Topics Concern  Not on file  Social History Narrative   Patient drink 4 16oz caffeine drinks a day    Social Drivers of Corporate investment banker Strain: Low Risk  (07/27/2022)   Overall Financial Resource Strain (CARDIA)    Difficulty of Paying Living Expenses: Not hard at all  Food Insecurity: No Food Insecurity (02/18/2023)   Hunger Vital Sign    Worried About Running Out of Food in the Last Year: Never true    Ran Out of Food in the Last Year: Never true  Transportation Needs: No Transportation Needs (02/18/2023)   PRAPARE - Administrator, Civil Service (Medical): No    Lack of Transportation (Non-Medical): No  Physical Activity: Inactive (07/26/2022)   Exercise Vital Sign    Days of Exercise per Week: 0 days    Minutes of Exercise per Session: 0 min  Stress: No Stress Concern Present (11/19/2022)   [employer] of Occupational Health - Occupational Stress Questionnaire    Feeling of Stress : Not at  all  Social Connections: Unknown (08/30/2021)   Received from [facility]   Social Network    Social Network: Not on file  Intimate Partner Violence: Not At Risk (02/18/2023)   Humiliation, Afraid, Rape, and Kick questionnaire    Fear of Current or Ex-Partner: No    Emotionally Abused: No    Physically Abused: No    Sexually Abused: No   Family History  Problem Relation Age of Onset   Diabetes Mother    COPD Mother    Hypertension Mother    Hyperlipidemia Mother    Sleep apnea Mother    Heart disease Father    Hyperlipidemia Father    Hypertension Father    Cancer Father    Sleep apnea Father    Heart disease Brother    Sleep apnea Brother    Heart disease Maternal Grandmother    Hypertension Maternal Grandmother    Sleep apnea Maternal Grandmother    Heart disease Maternal Grandfather    Kidney cancer Paternal Grandmother    Heart disease Paternal Grandfather    Heart disease Daughter       Review of Systems  All other systems reviewed and are negative.      Objective:   Physical Exam Vitals reviewed.  Constitutional:      General: She is not in acute distress.    Appearance: She is well-developed. She is not diaphoretic.  Neck:     Thyroid: No thyromegaly.     Trachea: No tracheal deviation.  Cardiovascular:     Rate and Rhythm: Normal rate and regular rhythm.     Heart sounds: Normal heart sounds. No murmur heard.    No friction rub. No gallop.  Pulmonary:     Effort: Pulmonary effort is normal. No respiratory distress.     Breath sounds: No stridor. No decreased breath sounds, wheezing, rhonchi or rales.  Chest:     Chest wall: No tenderness.  Neurological:     Mental Status: She is alert.     Motor: No abnormal muscle tone.     Deep Tendon Reflexes: Reflexes are normal and symmetric.       Assessment & Plan:  Anemia, unspecified type - Plan: CBC with Differential/Platelet, COMPLETE METABOLIC PANEL WITH  GFR  Hypersomnolence  OSA (obstructive sleep apnea) I believe the patient's hypersomnolence is a primary.  Believe a large percentages due to untreated sleep apnea coupled with polypharmacy.  I will  arrange for the patient to have an auto CPAP at home 5 to 15 mm of water with humidified air.  I have also recommended she try to discontinue methocarbamol and reduce her amount of trazodone under the discretion of her psychiatrist.

## 2023-05-06 NOTE — Telephone Encounter (Signed)
Verbal orders given for OT.  Are you okay with at speech therapy referral? Thanks.  Copied from CRM [id number]. Topic: Clinical - Home Health Verbal Orders >> May 06, 2023  4:08 PM Maxwell Marion wrote: Caller/Agency: Darral Dash, requested I mark this as high priority Callback Number: [phone] Service Requested: Occupational Therapy Frequency: 1 week 1, 2 week 3 Any new concerns about the patient? Yes, patient also requested evaluation for speech therapy

## 2023-05-07 LAB — COMPLETE METABOLIC PANEL WITH GFR
AG Ratio: 1.5 (calc) (ref 1.0–2.5)
ALT: 10 U/L (ref 6–29)
AST: 14 U/L (ref 10–35)
Albumin: 4.1 g/dL (ref 3.6–5.1)
Alkaline phosphatase (APISO): 84 U/L (ref 37–153)
BUN: 17 mg/dL (ref 7–25)
CO2: 25 mmol/L (ref 20–32)
Calcium: 9.2 mg/dL (ref 8.6–10.4)
Chloride: 104 mmol/L (ref 98–110)
Creat: 0.89 mg/dL (ref 0.50–1.03)
Globulin: 2.7 g/dL (ref 1.9–3.7)
Glucose, Bld: 122 mg/dL — ABNORMAL HIGH (ref 65–99)
Potassium: 4.1 mmol/L (ref 3.5–5.3)
Sodium: 137 mmol/L (ref 135–146)
Total Bilirubin: 0.3 mg/dL (ref 0.2–1.2)
Total Protein: 6.8 g/dL (ref 6.1–8.1)
eGFR: 75 mL/min/{1.73_m2} (ref >=60)

## 2023-05-07 LAB — CBC WITH DIFFERENTIAL/PLATELET
Absolute Lymphocytes: 2055 {cells}/uL (ref 850–3900)
Absolute Monocytes: 280 {cells}/uL (ref 200–950)
Basophils Absolute: 22 {cells}/uL (ref 0–200)
Basophils Relative: 0.4 %
Eosinophils Absolute: 129 {cells}/uL (ref 15–500)
Eosinophils Relative: 2.3 %
HCT: 34.2 % — ABNORMAL LOW (ref 35.0–45.0)
Hemoglobin: 11.5 g/dL — ABNORMAL LOW (ref 11.7–15.5)
MCH: 32.2 pg (ref 27.0–33.0)
MCHC: 33.6 g/dL (ref 32.0–36.0)
MCV: 95.8 fL (ref 80.0–100.0)
MPV: 9.9 fL (ref 7.5–12.5)
Monocytes Relative: 5 %
Neutro Abs: 3114 {cells}/uL (ref 1500–7800)
Neutrophils Relative %: 55.6 %
Platelets: 257 10*3/uL (ref 140–400)
RBC: 3.57 10*6/uL — ABNORMAL LOW (ref 3.80–5.10)
RDW: 12.3 % (ref 11.0–15.0)
Total Lymphocyte: 36.7 %
WBC: 5.6 10*3/uL (ref 3.8–10.8)

## 2023-05-09 ENCOUNTER — Other Ambulatory Visit: Payer: Self-pay

## 2023-05-09 DIAGNOSIS — G2401 Drug induced subacute dyskinesia: Secondary | ICD-10-CM

## 2023-05-09 DIAGNOSIS — K219 Gastro-esophageal reflux disease without esophagitis: Secondary | ICD-10-CM

## 2023-05-10 ENCOUNTER — Telehealth: Payer: Self-pay | Admitting: Family Medicine

## 2023-05-10 DIAGNOSIS — J45909 Unspecified asthma, uncomplicated: Secondary | ICD-10-CM | POA: Diagnosis not present

## 2023-05-10 DIAGNOSIS — T8781 Dehiscence of amputation stump: Secondary | ICD-10-CM | POA: Diagnosis not present

## 2023-05-10 DIAGNOSIS — G4733 Obstructive sleep apnea (adult) (pediatric): Secondary | ICD-10-CM | POA: Diagnosis not present

## 2023-05-10 DIAGNOSIS — R569 Unspecified convulsions: Secondary | ICD-10-CM | POA: Diagnosis not present

## 2023-05-10 DIAGNOSIS — M869 Osteomyelitis, unspecified: Secondary | ICD-10-CM | POA: Diagnosis not present

## 2023-05-10 DIAGNOSIS — R7303 Prediabetes: Secondary | ICD-10-CM | POA: Diagnosis not present

## 2023-05-10 DIAGNOSIS — Z791 Long term (current) use of non-steroidal anti-inflammatories (NSAID): Secondary | ICD-10-CM | POA: Diagnosis not present

## 2023-05-10 DIAGNOSIS — N182 Chronic kidney disease, stage 2 (mild): Secondary | ICD-10-CM | POA: Diagnosis not present

## 2023-05-10 DIAGNOSIS — G894 Chronic pain syndrome: Secondary | ICD-10-CM | POA: Diagnosis not present

## 2023-05-10 DIAGNOSIS — I5032 Chronic diastolic (congestive) heart failure: Secondary | ICD-10-CM | POA: Diagnosis not present

## 2023-05-10 DIAGNOSIS — I130 Hypertensive heart and chronic kidney disease with heart failure and stage 1 through stage 4 chronic kidney disease, or unspecified chronic kidney disease: Secondary | ICD-10-CM | POA: Diagnosis not present

## 2023-05-10 DIAGNOSIS — K219 Gastro-esophageal reflux disease without esophagitis: Secondary | ICD-10-CM | POA: Diagnosis not present

## 2023-05-10 DIAGNOSIS — T8743 Infection of amputation stump, right lower extremity: Secondary | ICD-10-CM | POA: Diagnosis not present

## 2023-05-10 NOTE — Telephone Encounter (Signed)
Copied from CRM [id number]. Topic: Clinical - Medication Question >> May 10, 2023  2:53 PM Carlatta H wrote: Reason for CRM: Please call patient about why neomycin-polymyxin-hydrocortisone (CORTISPORIN) OTIC solution [[id number]] medication was prescribed//

## 2023-05-11 ENCOUNTER — Ambulatory Visit (INDEPENDENT_AMBULATORY_CARE_PROVIDER_SITE_OTHER): Payer: Self-pay | Admitting: Podiatry

## 2023-05-11 ENCOUNTER — Encounter: Payer: Self-pay | Admitting: Podiatry

## 2023-05-11 DIAGNOSIS — T8130XA Disruption of wound, unspecified, initial encounter: Secondary | ICD-10-CM

## 2023-05-11 DIAGNOSIS — G629 Polyneuropathy, unspecified: Secondary | ICD-10-CM

## 2023-05-11 DIAGNOSIS — Z89431 Acquired absence of right foot: Secondary | ICD-10-CM

## 2023-05-11 DIAGNOSIS — Z9889 Other specified postprocedural states: Secondary | ICD-10-CM

## 2023-05-11 NOTE — Progress Notes (Signed)
Subjective:  Patient ID: [name] [name], female    DOB: 20-Feb-1966,  MRN: [id number]  Chief Complaint  Patient presents with   Wound Check    Pt presents for wound check  Right transmetatarsal amputation states she is doing well denies pain.    DOS: 02/19/23 Procedure: Right transmetatarsal amputation revision  58 y.o. female returns for POV#4. Ding well and wound improving. Has been dressing as instructed.   Review of Systems: Negative except as noted in the HPI. Denies N/V/F/Ch.  Past Medical History:  Diagnosis Date   Acute metabolic encephalopathy 06/08/2022   AMS (altered mental status) 05/22/2022   Anxiety    Arthritis    Phreesia 02/08/2020   Asthma    mild intermittent   Asthma    Phreesia 02/08/2020   Bipolar 1 disorder ([name])    ect treatments last treatment Sep 02 1011   Depression    Depression    Phreesia 02/08/2020   Depression    Phreesia 07/10/2020   GERD (gastroesophageal reflux disease)    Headache    Hypertension    Pre-diabetes    Seizures ([name])    Thinks it was from taking Tramadol   Septic shock ([name]) 05/22/2022   Shortness of breath 09/23/2016   Overview:   exertional   Sleep apnea    AHI 40 (2024)   Substance abuse ([name])    Phreesia 02/08/2020   Syncope 08/26/2019   Syncope and collapse     Current Outpatient Medications:    busPIRone (BUSPAR) 15 MG tablet, Take 15 mg by mouth 3 (three) times daily. , Disp: , Rfl: 2   Calcium Carbonate-Vit D-Min (CALCIUM 1200 PO), Take 1,200 mg by mouth daily. , Disp: , Rfl:    celecoxib (CELEBREX) 100 MG capsule, Take 100 mg by mouth 2 (two) times daily as needed., Disp: , Rfl:    DULoxetine (CYMBALTA) 60 MG capsule, Take 60 mg by mouth daily., Disp: , Rfl:    Erenumab-aooe (AIMOVIG) 70 MG/ML SOAJ, administer 1mL UNDER THE SKIN every 30 DAYS, Disp: 1 mL, Rfl: 5   escitalopram (LEXAPRO) 10 MG tablet, Take 10 mg by mouth daily., Disp: , Rfl:    estradiol (ESTRACE) 1 MG tablet, TAKE ONE TABLET BY MOUTH  EVERYDAY AT BEDTIME (Patient taking differently: Take 1 mg by mouth at bedtime.), Disp: 90 tablet, Rfl: 3   famotidine (PEPCID) 40 MG tablet, TAKE 1 TABLET BY MOUTH EVERY DAY AT BEDTIME, Disp: 90 tablet, Rfl: 1   ferrous sulfate (FEROSUL) 325 (65 FE) MG tablet, Take 1 tablet (325 mg total) by mouth 2 (two) times daily with a meal. TAKE 1 TABLET BY MOUTH AT BREAKFAST AND AT BEDTIME, Disp: 60 tablet, Rfl: 3   haloperidol (HALDOL) 5 MG tablet, Take 5 mg by mouth at bedtime., Disp: , Rfl:    lamoTRIgine (LAMICTAL) 100 MG tablet, Take 100 mg by mouth daily., Disp: , Rfl:    levocetirizine (XYZAL) 5 MG tablet, TAKE 1 TABLET BY MOUTH EVERY EVENING, Disp: 90 tablet, Rfl: 3   methocarbamol (ROBAXIN) 500 MG tablet, Take 500 mg by mouth every 8 (eight) hours as needed., Disp: , Rfl:    metoprolol succinate (TOPROL-XL) 25 MG 24 hr tablet, Take 25 mg by mouth daily., Disp: , Rfl:    Multiple Vitamins-Minerals (MULTIVITAMIN WITH MINERALS) tablet, Take 1 tablet by mouth daily., Disp: , Rfl:    neomycin-polymyxin-hydrocortisone (CORTISPORIN) OTIC solution, Place 3 drops into the right ear 4 (four) times daily., Disp: 10 mL,  Rfl: 1   oxyCODONE-acetaminophen (PERCOCET) 10-325 MG tablet, Take 1 tablet by mouth every 6 (six) hours as needed for pain. Take one tablet by mouth up to 4 times daily as needed., Disp: 30 tablet, Rfl: 0   pantoprazole (PROTONIX) 40 MG tablet, TAKE ONE (1) TABLET BY MOUTH TWICE DAILY (Patient taking differently: Take 40 mg by mouth 2 (two) times daily.), Disp: 180 tablet, Rfl: 1   polyethylene glycol (MIRALAX / GLYCOLAX) 17 g packet, Take 17 g by mouth daily., Disp: , Rfl:    prazosin (MINIPRESS) 2 MG capsule, Take 2 mg by mouth at bedtime., Disp: , Rfl:    pregabalin (LYRICA) 100 MG capsule, Take 1 capsule (100 mg total) by mouth 3 (three) times daily., Disp: 30 capsule, Rfl: 0   senna-docusate (SENOKOT-S) 8.6-50 MG tablet, Take 1 tablet by mouth at bedtime as needed for mild constipation.,  Disp: , Rfl:    solifenacin (VESICARE) 10 MG tablet, TAKE 1 TABLET BY MOUTH ONCE DAILY, Disp: 90 tablet, Rfl: 1   SUMAtriptan (IMITREX) 50 MG tablet, Take 50 mg by mouth every 2 (two) hours as needed for migraine., Disp: , Rfl:    topiramate (TOPAMAX) 50 MG tablet, TAKE ONE (1) TABLET BY MOUTH TWICE DAILY (Patient taking differently: Take 50 mg by mouth 2 (two) times daily.), Disp: 180 tablet, Rfl: 1   traZODone (DESYREL) 100 MG tablet, Take 1 tablet (100 mg total) by mouth at bedtime. (Patient taking differently: Take 200 mg by mouth at bedtime. 2 tablets at bedtime.), Disp: 30 tablet, Rfl: 0  Social History   Tobacco Use  Smoking Status Never  Smokeless Tobacco Never    Allergies  Allergen Reactions   Tetracyclines & Related Other (See Comments)    Syncope and put her "in a coma"   Tramadol Other (See Comments)    Seizures   Oxybutynin Other (See Comments)    Unknown reaction per Mom   Phenazopyridine Other (See Comments)    Unknown reaction   Ciprofloxacin Rash and Itching   Codeine Itching and Rash   Estradiol Rash    Patches broke out the skin   Objective:  There were no vitals filed for this visit. There is no height or weight on file to calculate BMI. Constitutional Well developed. Well nourished.  Vascular Foot warm and well perfused. Capillary refill normal to all digits.   Neurologic Normal speech. Oriented to person, place, and time. Epicritic sensation to light touch grossly present bilaterally.  Dermatologic Skin healing well without signs of infection. Skin edges well coapted without signs of infection. Dehiscence healed. No erythema edema or purulence noted.   Orthopedic: Tenderness to palpation noted about the surgical site.   Radiographs: No osseous erosions noted Interval resection of metatarsals.  No growth of cultures and pathology negative for osteomyelitis  Assessment:   1. Status post right foot surgery   2. Wound dehiscence     Plan:  Patient  was evaluated and treated and all questions answered.  S/p foot surgery right -Progressing well. Wound dehiscence healed. No ulceration noted.  -Off-loading with CAM boot May begin WBAT in CAM boot  -Will work on DM shoes.  -No abx indicated.  -Discussed glucose control and proper protein-rich diet.  -Discussed if any worsening redness, pain, fever or chills to call or may need to report to the emergency room. Patient expressed understanding.   Return in 3 weeks for recheck.    Return in about 3 weeks (around 06/01/2023) for wound  check.   Return in about 3 weeks (around 06/01/2023) for wound check.

## 2023-05-12 DIAGNOSIS — T8743 Infection of amputation stump, right lower extremity: Secondary | ICD-10-CM | POA: Diagnosis not present

## 2023-05-12 DIAGNOSIS — K219 Gastro-esophageal reflux disease without esophagitis: Secondary | ICD-10-CM | POA: Diagnosis not present

## 2023-05-12 DIAGNOSIS — J45909 Unspecified asthma, uncomplicated: Secondary | ICD-10-CM | POA: Diagnosis not present

## 2023-05-12 DIAGNOSIS — G4733 Obstructive sleep apnea (adult) (pediatric): Secondary | ICD-10-CM | POA: Diagnosis not present

## 2023-05-12 DIAGNOSIS — R569 Unspecified convulsions: Secondary | ICD-10-CM | POA: Diagnosis not present

## 2023-05-12 DIAGNOSIS — G894 Chronic pain syndrome: Secondary | ICD-10-CM | POA: Diagnosis not present

## 2023-05-12 DIAGNOSIS — Z791 Long term (current) use of non-steroidal anti-inflammatories (NSAID): Secondary | ICD-10-CM | POA: Diagnosis not present

## 2023-05-12 DIAGNOSIS — R7303 Prediabetes: Secondary | ICD-10-CM | POA: Diagnosis not present

## 2023-05-12 DIAGNOSIS — N182 Chronic kidney disease, stage 2 (mild): Secondary | ICD-10-CM | POA: Diagnosis not present

## 2023-05-12 DIAGNOSIS — T8781 Dehiscence of amputation stump: Secondary | ICD-10-CM | POA: Diagnosis not present

## 2023-05-12 DIAGNOSIS — M869 Osteomyelitis, unspecified: Secondary | ICD-10-CM | POA: Diagnosis not present

## 2023-05-12 DIAGNOSIS — I5032 Chronic diastolic (congestive) heart failure: Secondary | ICD-10-CM | POA: Diagnosis not present

## 2023-05-12 DIAGNOSIS — I130 Hypertensive heart and chronic kidney disease with heart failure and stage 1 through stage 4 chronic kidney disease, or unspecified chronic kidney disease: Secondary | ICD-10-CM | POA: Diagnosis not present

## 2023-05-13 DIAGNOSIS — I130 Hypertensive heart and chronic kidney disease with heart failure and stage 1 through stage 4 chronic kidney disease, or unspecified chronic kidney disease: Secondary | ICD-10-CM | POA: Diagnosis not present

## 2023-05-13 DIAGNOSIS — G4733 Obstructive sleep apnea (adult) (pediatric): Secondary | ICD-10-CM | POA: Diagnosis not present

## 2023-05-13 DIAGNOSIS — I5032 Chronic diastolic (congestive) heart failure: Secondary | ICD-10-CM | POA: Diagnosis not present

## 2023-05-13 DIAGNOSIS — Z791 Long term (current) use of non-steroidal anti-inflammatories (NSAID): Secondary | ICD-10-CM | POA: Diagnosis not present

## 2023-05-13 DIAGNOSIS — T8781 Dehiscence of amputation stump: Secondary | ICD-10-CM | POA: Diagnosis not present

## 2023-05-13 DIAGNOSIS — J45909 Unspecified asthma, uncomplicated: Secondary | ICD-10-CM | POA: Diagnosis not present

## 2023-05-13 DIAGNOSIS — M869 Osteomyelitis, unspecified: Secondary | ICD-10-CM | POA: Diagnosis not present

## 2023-05-13 DIAGNOSIS — N182 Chronic kidney disease, stage 2 (mild): Secondary | ICD-10-CM | POA: Diagnosis not present

## 2023-05-13 DIAGNOSIS — R569 Unspecified convulsions: Secondary | ICD-10-CM | POA: Diagnosis not present

## 2023-05-13 DIAGNOSIS — R7303 Prediabetes: Secondary | ICD-10-CM | POA: Diagnosis not present

## 2023-05-13 DIAGNOSIS — T8743 Infection of amputation stump, right lower extremity: Secondary | ICD-10-CM | POA: Diagnosis not present

## 2023-05-13 DIAGNOSIS — K219 Gastro-esophageal reflux disease without esophagitis: Secondary | ICD-10-CM | POA: Diagnosis not present

## 2023-05-13 DIAGNOSIS — G894 Chronic pain syndrome: Secondary | ICD-10-CM | POA: Diagnosis not present

## 2023-05-16 DIAGNOSIS — M25561 Pain in right knee: Secondary | ICD-10-CM | POA: Diagnosis not present

## 2023-05-16 DIAGNOSIS — M961 Postlaminectomy syndrome, not elsewhere classified: Secondary | ICD-10-CM | POA: Diagnosis not present

## 2023-05-16 DIAGNOSIS — M25511 Pain in right shoulder: Secondary | ICD-10-CM | POA: Diagnosis not present

## 2023-05-16 DIAGNOSIS — M25552 Pain in left hip: Secondary | ICD-10-CM | POA: Diagnosis not present

## 2023-05-17 ENCOUNTER — Ambulatory Visit: Payer: 59 | Admitting: Speech Pathology

## 2023-05-17 DIAGNOSIS — T8743 Infection of amputation stump, right lower extremity: Secondary | ICD-10-CM | POA: Diagnosis not present

## 2023-05-17 DIAGNOSIS — R569 Unspecified convulsions: Secondary | ICD-10-CM | POA: Diagnosis not present

## 2023-05-17 DIAGNOSIS — J45909 Unspecified asthma, uncomplicated: Secondary | ICD-10-CM | POA: Diagnosis not present

## 2023-05-17 DIAGNOSIS — I130 Hypertensive heart and chronic kidney disease with heart failure and stage 1 through stage 4 chronic kidney disease, or unspecified chronic kidney disease: Secondary | ICD-10-CM | POA: Diagnosis not present

## 2023-05-17 DIAGNOSIS — G4733 Obstructive sleep apnea (adult) (pediatric): Secondary | ICD-10-CM | POA: Diagnosis not present

## 2023-05-17 DIAGNOSIS — M869 Osteomyelitis, unspecified: Secondary | ICD-10-CM | POA: Diagnosis not present

## 2023-05-17 DIAGNOSIS — N182 Chronic kidney disease, stage 2 (mild): Secondary | ICD-10-CM | POA: Diagnosis not present

## 2023-05-17 DIAGNOSIS — I5032 Chronic diastolic (congestive) heart failure: Secondary | ICD-10-CM | POA: Diagnosis not present

## 2023-05-17 DIAGNOSIS — G894 Chronic pain syndrome: Secondary | ICD-10-CM | POA: Diagnosis not present

## 2023-05-17 DIAGNOSIS — R7303 Prediabetes: Secondary | ICD-10-CM | POA: Diagnosis not present

## 2023-05-17 DIAGNOSIS — K219 Gastro-esophageal reflux disease without esophagitis: Secondary | ICD-10-CM | POA: Diagnosis not present

## 2023-05-17 DIAGNOSIS — Z791 Long term (current) use of non-steroidal anti-inflammatories (NSAID): Secondary | ICD-10-CM | POA: Diagnosis not present

## 2023-05-17 DIAGNOSIS — T8781 Dehiscence of amputation stump: Secondary | ICD-10-CM | POA: Diagnosis not present

## 2023-05-17 NOTE — Telephone Encounter (Unsigned)
Copied from CRM [id number]. Topic: Clinical - Home Health Verbal Orders >> May 17, 2023  3:10 PM Gildardo Pounds wrote: Caller/Agency: Angie with [facility] Callback Number: [phone] Confidential VM Service Requested: Physical Therapy Frequency: 2 week 4 and 1 week 5 Any new concerns about the patient? No

## 2023-05-18 DIAGNOSIS — I130 Hypertensive heart and chronic kidney disease with heart failure and stage 1 through stage 4 chronic kidney disease, or unspecified chronic kidney disease: Secondary | ICD-10-CM | POA: Diagnosis not present

## 2023-05-18 DIAGNOSIS — G894 Chronic pain syndrome: Secondary | ICD-10-CM | POA: Diagnosis not present

## 2023-05-18 DIAGNOSIS — K219 Gastro-esophageal reflux disease without esophagitis: Secondary | ICD-10-CM | POA: Diagnosis not present

## 2023-05-18 DIAGNOSIS — R490 Dysphonia: Secondary | ICD-10-CM | POA: Diagnosis not present

## 2023-05-18 DIAGNOSIS — R569 Unspecified convulsions: Secondary | ICD-10-CM | POA: Diagnosis not present

## 2023-05-18 DIAGNOSIS — J45909 Unspecified asthma, uncomplicated: Secondary | ICD-10-CM | POA: Diagnosis not present

## 2023-05-18 DIAGNOSIS — G4733 Obstructive sleep apnea (adult) (pediatric): Secondary | ICD-10-CM | POA: Diagnosis not present

## 2023-05-18 DIAGNOSIS — R1310 Dysphagia, unspecified: Secondary | ICD-10-CM | POA: Diagnosis not present

## 2023-05-18 DIAGNOSIS — Z4781 Encounter for orthopedic aftercare following surgical amputation: Secondary | ICD-10-CM | POA: Diagnosis not present

## 2023-05-18 DIAGNOSIS — N182 Chronic kidney disease, stage 2 (mild): Secondary | ICD-10-CM | POA: Diagnosis not present

## 2023-05-18 DIAGNOSIS — Z791 Long term (current) use of non-steroidal anti-inflammatories (NSAID): Secondary | ICD-10-CM | POA: Diagnosis not present

## 2023-05-18 DIAGNOSIS — R7303 Prediabetes: Secondary | ICD-10-CM | POA: Diagnosis not present

## 2023-05-18 DIAGNOSIS — I5032 Chronic diastolic (congestive) heart failure: Secondary | ICD-10-CM | POA: Diagnosis not present

## 2023-05-20 DIAGNOSIS — G894 Chronic pain syndrome: Secondary | ICD-10-CM | POA: Diagnosis not present

## 2023-05-20 DIAGNOSIS — R7303 Prediabetes: Secondary | ICD-10-CM | POA: Diagnosis not present

## 2023-05-20 DIAGNOSIS — K219 Gastro-esophageal reflux disease without esophagitis: Secondary | ICD-10-CM | POA: Diagnosis not present

## 2023-05-20 DIAGNOSIS — I130 Hypertensive heart and chronic kidney disease with heart failure and stage 1 through stage 4 chronic kidney disease, or unspecified chronic kidney disease: Secondary | ICD-10-CM | POA: Diagnosis not present

## 2023-05-20 DIAGNOSIS — G4733 Obstructive sleep apnea (adult) (pediatric): Secondary | ICD-10-CM | POA: Diagnosis not present

## 2023-05-20 DIAGNOSIS — R490 Dysphonia: Secondary | ICD-10-CM | POA: Diagnosis not present

## 2023-05-20 DIAGNOSIS — Z791 Long term (current) use of non-steroidal anti-inflammatories (NSAID): Secondary | ICD-10-CM | POA: Diagnosis not present

## 2023-05-20 DIAGNOSIS — I5032 Chronic diastolic (congestive) heart failure: Secondary | ICD-10-CM | POA: Diagnosis not present

## 2023-05-20 DIAGNOSIS — R1310 Dysphagia, unspecified: Secondary | ICD-10-CM | POA: Diagnosis not present

## 2023-05-20 DIAGNOSIS — J45909 Unspecified asthma, uncomplicated: Secondary | ICD-10-CM | POA: Diagnosis not present

## 2023-05-20 DIAGNOSIS — R569 Unspecified convulsions: Secondary | ICD-10-CM | POA: Diagnosis not present

## 2023-05-20 DIAGNOSIS — N182 Chronic kidney disease, stage 2 (mild): Secondary | ICD-10-CM | POA: Diagnosis not present

## 2023-05-20 DIAGNOSIS — Z4781 Encounter for orthopedic aftercare following surgical amputation: Secondary | ICD-10-CM | POA: Diagnosis not present

## 2023-05-27 ENCOUNTER — Telehealth: Payer: Self-pay | Admitting: Family Medicine

## 2023-05-27 DIAGNOSIS — R490 Dysphonia: Secondary | ICD-10-CM | POA: Diagnosis not present

## 2023-05-27 DIAGNOSIS — R7303 Prediabetes: Secondary | ICD-10-CM | POA: Diagnosis not present

## 2023-05-27 DIAGNOSIS — R569 Unspecified convulsions: Secondary | ICD-10-CM | POA: Diagnosis not present

## 2023-05-27 DIAGNOSIS — Z791 Long term (current) use of non-steroidal anti-inflammatories (NSAID): Secondary | ICD-10-CM | POA: Diagnosis not present

## 2023-05-27 DIAGNOSIS — I5032 Chronic diastolic (congestive) heart failure: Secondary | ICD-10-CM | POA: Diagnosis not present

## 2023-05-27 DIAGNOSIS — K219 Gastro-esophageal reflux disease without esophagitis: Secondary | ICD-10-CM | POA: Diagnosis not present

## 2023-05-27 DIAGNOSIS — Z4781 Encounter for orthopedic aftercare following surgical amputation: Secondary | ICD-10-CM | POA: Diagnosis not present

## 2023-05-27 DIAGNOSIS — N182 Chronic kidney disease, stage 2 (mild): Secondary | ICD-10-CM | POA: Diagnosis not present

## 2023-05-27 DIAGNOSIS — G4733 Obstructive sleep apnea (adult) (pediatric): Secondary | ICD-10-CM | POA: Diagnosis not present

## 2023-05-27 DIAGNOSIS — G894 Chronic pain syndrome: Secondary | ICD-10-CM | POA: Diagnosis not present

## 2023-05-27 DIAGNOSIS — I130 Hypertensive heart and chronic kidney disease with heart failure and stage 1 through stage 4 chronic kidney disease, or unspecified chronic kidney disease: Secondary | ICD-10-CM | POA: Diagnosis not present

## 2023-05-27 DIAGNOSIS — J45909 Unspecified asthma, uncomplicated: Secondary | ICD-10-CM | POA: Diagnosis not present

## 2023-05-27 DIAGNOSIS — R1310 Dysphagia, unspecified: Secondary | ICD-10-CM | POA: Diagnosis not present

## 2023-05-27 NOTE — Telephone Encounter (Signed)
 Copied from CRM [id number]. Topic: Referral - Question >> May 27, 2023  4:54 PM Bridgette Campus T wrote: Reason for CRM: Have not heard anything about the CPAP, please call to advise [phone]

## 2023-05-30 ENCOUNTER — Telehealth: Payer: Self-pay

## 2023-05-30 ENCOUNTER — Encounter: Payer: Self-pay | Admitting: Podiatry

## 2023-05-30 ENCOUNTER — Other Ambulatory Visit: Payer: Self-pay | Admitting: Family Medicine

## 2023-05-30 ENCOUNTER — Other Ambulatory Visit: Payer: Self-pay

## 2023-05-30 ENCOUNTER — Ambulatory Visit (INDEPENDENT_AMBULATORY_CARE_PROVIDER_SITE_OTHER): Payer: 59 | Admitting: Podiatry

## 2023-05-30 DIAGNOSIS — L97512 Non-pressure chronic ulcer of other part of right foot with fat layer exposed: Secondary | ICD-10-CM | POA: Diagnosis not present

## 2023-05-30 DIAGNOSIS — G894 Chronic pain syndrome: Secondary | ICD-10-CM | POA: Diagnosis not present

## 2023-05-30 DIAGNOSIS — G629 Polyneuropathy, unspecified: Secondary | ICD-10-CM | POA: Diagnosis not present

## 2023-05-30 DIAGNOSIS — M545 Low back pain, unspecified: Secondary | ICD-10-CM | POA: Diagnosis not present

## 2023-05-30 DIAGNOSIS — M869 Osteomyelitis, unspecified: Secondary | ICD-10-CM | POA: Diagnosis not present

## 2023-05-30 DIAGNOSIS — G4710 Hypersomnia, unspecified: Secondary | ICD-10-CM

## 2023-05-30 DIAGNOSIS — Z9889 Other specified postprocedural states: Secondary | ICD-10-CM

## 2023-05-30 DIAGNOSIS — G4733 Obstructive sleep apnea (adult) (pediatric): Secondary | ICD-10-CM

## 2023-05-30 DIAGNOSIS — R531 Weakness: Secondary | ICD-10-CM | POA: Diagnosis not present

## 2023-05-30 NOTE — Progress Notes (Signed)
 Subjective:  Patient ID: [name] [name], female    DOB: 06-20-1965,  MRN: [id number]  Chief Complaint  Patient presents with   Wound Check     Pt presents for wound check  Right transmetatarsal amputation, states the wound is open and it has an odor.      DOS: 02/19/23 Procedure: Right transmetatarsal amputation revision  58 y.o. female returns for POV#5. Relates wound has reopened on the bottom of her foot. Relates she noticed it several days ago. She has been keeping it covered and came in to be seen as soon as she could.   Review of Systems: Negative except as noted in the HPI. Denies N/V/F/Ch.  Past Medical History:  Diagnosis Date   Acute metabolic encephalopathy 06/08/2022   AMS (altered mental status) 05/22/2022   Anxiety    Arthritis    Phreesia 02/08/2020   Asthma    mild intermittent   Asthma    Phreesia 02/08/2020   Bipolar 1 disorder ([name])    ect treatments last treatment Sep 02 1011   Depression    Depression    Phreesia 02/08/2020   Depression    Phreesia 07/10/2020   GERD (gastroesophageal reflux disease)    Headache    Hypertension    Pre-diabetes    Seizures ([name])    Thinks it was from taking Tramadol   Septic shock ([name]) 05/22/2022   Shortness of breath 09/23/2016   Overview:   exertional   Sleep apnea    AHI 40 (2024)   Substance abuse ([name])    Phreesia 02/08/2020   Syncope 08/26/2019   Syncope and collapse     Current Outpatient Medications:    busPIRone  (BUSPAR ) 15 MG tablet, Take 15 mg by mouth 3 (three) times daily. , Disp: , Rfl: 2   Calcium  Carbonate-Vit D-Min (CALCIUM  1200 PO), Take 1,200 mg by mouth daily. , Disp: , Rfl:    celecoxib  (CELEBREX ) 100 MG capsule, Take 100 mg by mouth 2 (two) times daily as needed., Disp: , Rfl:    DULoxetine  (CYMBALTA ) 60 MG capsule, Take 60 mg by mouth daily., Disp: , Rfl:    Erenumab -aooe (AIMOVIG ) 70 MG/ML SOAJ, administer 1mL UNDER THE SKIN every 30 DAYS, Disp: 1 mL, Rfl: 5   escitalopram   (LEXAPRO ) 10 MG tablet, Take 10 mg by mouth daily., Disp: , Rfl:    estradiol  (ESTRACE ) 1 MG tablet, TAKE ONE TABLET BY MOUTH EVERYDAY AT BEDTIME (Patient taking differently: Take 1 mg by mouth at bedtime.), Disp: 90 tablet, Rfl: 3   famotidine  (PEPCID ) 40 MG tablet, TAKE 1 TABLET BY MOUTH EVERY DAY AT BEDTIME, Disp: 90 tablet, Rfl: 1   ferrous sulfate  (FEROSUL) 325 (65 FE) MG tablet, Take 1 tablet (325 mg total) by mouth 2 (two) times daily with a meal. TAKE 1 TABLET BY MOUTH AT BREAKFAST AND AT BEDTIME, Disp: 60 tablet, Rfl: 3   haloperidol  (HALDOL ) 5 MG tablet, Take 5 mg by mouth at bedtime., Disp: , Rfl:    lamoTRIgine  (LAMICTAL ) 100 MG tablet, Take 100 mg by mouth daily., Disp: , Rfl:    levocetirizine (XYZAL ) 5 MG tablet, TAKE 1 TABLET BY MOUTH EVERY EVENING, Disp: 90 tablet, Rfl: 3   methocarbamol  (ROBAXIN ) 500 MG tablet, Take 500 mg by mouth every 8 (eight) hours as needed., Disp: , Rfl:    metoprolol  succinate (TOPROL -XL) 25 MG 24 hr tablet, Take 25 mg by mouth daily., Disp: , Rfl:    Multiple Vitamins-Minerals (MULTIVITAMIN WITH MINERALS) tablet,  Take 1 tablet by mouth daily., Disp: , Rfl:    neomycin -polymyxin-hydrocortisone (CORTISPORIN) OTIC solution, Place 3 drops into the right ear 4 (four) times daily., Disp: 10 mL, Rfl: 1   oxyCODONE -acetaminophen  (PERCOCET) 10-325 MG tablet, Take 1 tablet by mouth every 6 (six) hours as needed for pain. Take one tablet by mouth up to 4 times daily as needed., Disp: 30 tablet, Rfl: 0   pantoprazole  (PROTONIX ) 40 MG tablet, TAKE ONE (1) TABLET BY MOUTH TWICE DAILY (Patient taking differently: Take 40 mg by mouth 2 (two) times daily.), Disp: 180 tablet, Rfl: 1   polyethylene glycol (MIRALAX  / GLYCOLAX ) 17 g packet, Take 17 g by mouth daily., Disp: , Rfl:    prazosin  (MINIPRESS ) 2 MG capsule, Take 2 mg by mouth at bedtime., Disp: , Rfl:    pregabalin  (LYRICA ) 100 MG capsule, Take 1 capsule (100 mg total) by mouth 3 (three) times daily., Disp: 30 capsule,  Rfl: 0   senna-docusate (SENOKOT-S) 8.6-50 MG tablet, Take 1 tablet by mouth at bedtime as needed for mild constipation., Disp: , Rfl:    solifenacin  (VESICARE ) 10 MG tablet, TAKE 1 TABLET BY MOUTH ONCE DAILY, Disp: 90 tablet, Rfl: 1   SUMAtriptan  (IMITREX ) 50 MG tablet, Take 50 mg by mouth every 2 (two) hours as needed for migraine., Disp: , Rfl:    topiramate  (TOPAMAX ) 50 MG tablet, TAKE ONE (1) TABLET BY MOUTH TWICE DAILY (Patient taking differently: Take 50 mg by mouth 2 (two) times daily.), Disp: 180 tablet, Rfl: 1   traZODone  (DESYREL ) 100 MG tablet, Take 1 tablet (100 mg total) by mouth at bedtime. (Patient taking differently: Take 200 mg by mouth at bedtime. 2 tablets at bedtime.), Disp: 30 tablet, Rfl: 0  Social History   Tobacco Use  Smoking Status Never  Smokeless Tobacco Never    Allergies  Allergen Reactions   Tetracyclines & Related Other (See Comments)    Syncope and put her "in a coma"   Tramadol Other (See Comments)    Seizures   Oxybutynin Other (See Comments)    Unknown reaction per Mom   Phenazopyridine  Other (See Comments)    Unknown reaction   Ciprofloxacin  Rash and Itching   Codeine Itching and Rash   Estradiol  Rash    Patches broke out the skin   Objective:  There were no vitals filed for this visit. There is no height or weight on file to calculate BMI. Constitutional Well developed. Well nourished.  Vascular Foot warm and well perfused. Capillary refill normal to all digits.   Neurologic Normal speech. Oriented to person, place, and time. Epicritic sensation to light touch grossly present bilaterally.  Dermatologic Skin healing well without signs of infection. Skin edges well coapted without signs of infection. Wound reopened to right to plantar right foot stump. Granular base. No erythema edema or purulence noted.  Measures about 0.5 cmx 0.2 cmx 0.2 cm   Orthopedic: Tenderness to palpation noted about the surgical site.   Radiographs: No osseous  erosions noted Interval resection of metatarsals.  No growth of cultures and pathology negative for osteomyelitis  Assessment:   1. 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 Status post right foot surgery   3. Neuropathy      Plan:  Patient was evaluated and treated and all questions answered.  S/p foot surgery right Ulcer plantar right foot stump with fat alyer exposed  -Debridement as below. -Dressed with betadine , DSD. -Off-loading with CAM  boot. Dispensed new boot as previous boot no longer functional.  -No abx indicated.  -Discussed glucose control and proper protein-rich diet.  -Discussed if any worsening redness, pain, fever or chills to call or may need to report to the emergency room. Patient expressed understanding.   Procedure: Excisional Debridement of Wound Rationale: Removal of non-viable soft tissue from the wound to promote healing.  Anesthesia: none Pre-Debridement Wound Measurements: Overlying slough  Post-Debridement Wound Measurements: 0.5 cm x 0.2 cm x 0.2 cm  Type of Debridement: Sharp Excisional Tissue Removed: Non-viable soft tissue Depth of Debridement: subcutaneous tissue. Technique: Sharp excisional debridement to bleeding, viable wound base.  Dressing: Dry, sterile, compression dressing. Disposition: Patient tolerated procedure well. Patient to return in 2 week for follow-up.  No follow-ups on file.    No follow-ups on file.   No follow-ups on file.

## 2023-05-30 NOTE — Telephone Encounter (Signed)
 Copied from CRM [id number]. Topic: Clinical - Prescription Issue >> May 30, 2023 12:15 PM Zipporah Him wrote: Reason for CRM: Erenumab -aooe (AIMOVIG ) 70 MG/ML SOAJ still not refilled patient states she has been waiting 2 months for prior authorization.

## 2023-05-30 NOTE — Telephone Encounter (Signed)
 Copied from CRM [id number]. Topic: Clinical - Medication Refill >> May 30, 2023 12:11 PM Zipporah Him wrote: Most Recent Primary Care Visit:  Provider: Eliane Grooms T  Department: [facility]  Visit Type: OFFICE VISIT  Date: 05/06/2023  Medication: Erenumab -aooe (AIMOVIG ) 70 MG/ML SOAJ [  Has the patient contacted their pharmacy? Yes (Agent: If no, request that the patient contact the pharmacy for the refill. If patient does not wish to contact the pharmacy document the reason why and proceed with request.) (Agent: If yes, when and what did the pharmacy advise?)  Is this the correct pharmacy for this prescription? Yes If no, delete pharmacy and type the correct one.  This is the patient's preferred pharmacy:   [facility], [place] - [address] [place] [address] Phone: [phone] Fax: [phone]   Has the prescription been filled recently? No  Is the patient out of the medication? Yes  Has the patient been seen for an appointment in the last year OR does the patient have an upcoming appointment? No  Can we respond through MyChart? No, phone  Agent: Please be advised that Rx refills may take up to 3 business days. We ask that you follow-up with your pharmacy.

## 2023-05-31 DIAGNOSIS — R7303 Prediabetes: Secondary | ICD-10-CM | POA: Diagnosis not present

## 2023-05-31 DIAGNOSIS — Z4781 Encounter for orthopedic aftercare following surgical amputation: Secondary | ICD-10-CM | POA: Diagnosis not present

## 2023-05-31 DIAGNOSIS — K219 Gastro-esophageal reflux disease without esophagitis: Secondary | ICD-10-CM | POA: Diagnosis not present

## 2023-05-31 DIAGNOSIS — R1310 Dysphagia, unspecified: Secondary | ICD-10-CM | POA: Diagnosis not present

## 2023-05-31 DIAGNOSIS — G894 Chronic pain syndrome: Secondary | ICD-10-CM | POA: Diagnosis not present

## 2023-05-31 DIAGNOSIS — R569 Unspecified convulsions: Secondary | ICD-10-CM | POA: Diagnosis not present

## 2023-05-31 DIAGNOSIS — Z791 Long term (current) use of non-steroidal anti-inflammatories (NSAID): Secondary | ICD-10-CM | POA: Diagnosis not present

## 2023-05-31 DIAGNOSIS — I130 Hypertensive heart and chronic kidney disease with heart failure and stage 1 through stage 4 chronic kidney disease, or unspecified chronic kidney disease: Secondary | ICD-10-CM | POA: Diagnosis not present

## 2023-05-31 DIAGNOSIS — G4733 Obstructive sleep apnea (adult) (pediatric): Secondary | ICD-10-CM | POA: Diagnosis not present

## 2023-05-31 DIAGNOSIS — R490 Dysphonia: Secondary | ICD-10-CM | POA: Diagnosis not present

## 2023-05-31 DIAGNOSIS — N182 Chronic kidney disease, stage 2 (mild): Secondary | ICD-10-CM | POA: Diagnosis not present

## 2023-05-31 DIAGNOSIS — I5032 Chronic diastolic (congestive) heart failure: Secondary | ICD-10-CM | POA: Diagnosis not present

## 2023-05-31 DIAGNOSIS — J45909 Unspecified asthma, uncomplicated: Secondary | ICD-10-CM | POA: Diagnosis not present

## 2023-06-01 DIAGNOSIS — M25559 Pain in unspecified hip: Secondary | ICD-10-CM | POA: Diagnosis not present

## 2023-06-01 DIAGNOSIS — Z79891 Long term (current) use of opiate analgesic: Secondary | ICD-10-CM | POA: Diagnosis not present

## 2023-06-01 DIAGNOSIS — M25519 Pain in unspecified shoulder: Secondary | ICD-10-CM | POA: Diagnosis not present

## 2023-06-01 DIAGNOSIS — G8929 Other chronic pain: Secondary | ICD-10-CM | POA: Diagnosis not present

## 2023-06-03 DIAGNOSIS — G894 Chronic pain syndrome: Secondary | ICD-10-CM | POA: Diagnosis not present

## 2023-06-03 DIAGNOSIS — J45909 Unspecified asthma, uncomplicated: Secondary | ICD-10-CM | POA: Diagnosis not present

## 2023-06-03 DIAGNOSIS — R569 Unspecified convulsions: Secondary | ICD-10-CM | POA: Diagnosis not present

## 2023-06-03 DIAGNOSIS — R490 Dysphonia: Secondary | ICD-10-CM | POA: Diagnosis not present

## 2023-06-03 DIAGNOSIS — R7303 Prediabetes: Secondary | ICD-10-CM | POA: Diagnosis not present

## 2023-06-03 DIAGNOSIS — I130 Hypertensive heart and chronic kidney disease with heart failure and stage 1 through stage 4 chronic kidney disease, or unspecified chronic kidney disease: Secondary | ICD-10-CM | POA: Diagnosis not present

## 2023-06-03 DIAGNOSIS — G4733 Obstructive sleep apnea (adult) (pediatric): Secondary | ICD-10-CM | POA: Diagnosis not present

## 2023-06-03 DIAGNOSIS — R1310 Dysphagia, unspecified: Secondary | ICD-10-CM | POA: Diagnosis not present

## 2023-06-03 DIAGNOSIS — Z4781 Encounter for orthopedic aftercare following surgical amputation: Secondary | ICD-10-CM | POA: Diagnosis not present

## 2023-06-03 DIAGNOSIS — N182 Chronic kidney disease, stage 2 (mild): Secondary | ICD-10-CM | POA: Diagnosis not present

## 2023-06-03 DIAGNOSIS — K219 Gastro-esophageal reflux disease without esophagitis: Secondary | ICD-10-CM | POA: Diagnosis not present

## 2023-06-03 DIAGNOSIS — I5032 Chronic diastolic (congestive) heart failure: Secondary | ICD-10-CM | POA: Diagnosis not present

## 2023-06-03 DIAGNOSIS — Z791 Long term (current) use of non-steroidal anti-inflammatories (NSAID): Secondary | ICD-10-CM | POA: Diagnosis not present

## 2023-06-06 ENCOUNTER — Other Ambulatory Visit: Payer: 59

## 2023-06-06 ENCOUNTER — Ambulatory Visit: Payer: 59 | Admitting: Podiatry

## 2023-06-07 DIAGNOSIS — K219 Gastro-esophageal reflux disease without esophagitis: Secondary | ICD-10-CM | POA: Diagnosis not present

## 2023-06-07 DIAGNOSIS — J45909 Unspecified asthma, uncomplicated: Secondary | ICD-10-CM | POA: Diagnosis not present

## 2023-06-07 DIAGNOSIS — R1310 Dysphagia, unspecified: Secondary | ICD-10-CM | POA: Diagnosis not present

## 2023-06-07 DIAGNOSIS — R490 Dysphonia: Secondary | ICD-10-CM | POA: Diagnosis not present

## 2023-06-07 DIAGNOSIS — Z791 Long term (current) use of non-steroidal anti-inflammatories (NSAID): Secondary | ICD-10-CM | POA: Diagnosis not present

## 2023-06-07 DIAGNOSIS — G4733 Obstructive sleep apnea (adult) (pediatric): Secondary | ICD-10-CM | POA: Diagnosis not present

## 2023-06-07 DIAGNOSIS — R569 Unspecified convulsions: Secondary | ICD-10-CM | POA: Diagnosis not present

## 2023-06-07 DIAGNOSIS — G894 Chronic pain syndrome: Secondary | ICD-10-CM | POA: Diagnosis not present

## 2023-06-07 DIAGNOSIS — R7303 Prediabetes: Secondary | ICD-10-CM | POA: Diagnosis not present

## 2023-06-07 DIAGNOSIS — N182 Chronic kidney disease, stage 2 (mild): Secondary | ICD-10-CM | POA: Diagnosis not present

## 2023-06-07 DIAGNOSIS — Z4781 Encounter for orthopedic aftercare following surgical amputation: Secondary | ICD-10-CM | POA: Diagnosis not present

## 2023-06-07 DIAGNOSIS — I5032 Chronic diastolic (congestive) heart failure: Secondary | ICD-10-CM | POA: Diagnosis not present

## 2023-06-07 DIAGNOSIS — I130 Hypertensive heart and chronic kidney disease with heart failure and stage 1 through stage 4 chronic kidney disease, or unspecified chronic kidney disease: Secondary | ICD-10-CM | POA: Diagnosis not present

## 2023-06-09 ENCOUNTER — Ambulatory Visit: Payer: Self-pay | Admitting: Family Medicine

## 2023-06-09 NOTE — Telephone Encounter (Signed)
2nd attempt to contact patient. Voicemail left-place back in call back folder

## 2023-06-09 NOTE — Telephone Encounter (Signed)
Third attempt to contact patient. Left a voicemail with office number.

## 2023-06-09 NOTE — Telephone Encounter (Signed)
First attempt to reach pt, left VM.   Copied from CRM [id number]. Topic: Clinical - Medical Advice >> Jun 09, 2023  4:10 PM Shon Hale wrote: Reason for CRM: Pt complaining of a possible headcold since Saturday. Pt sneezing, has a cough and congestion. Pt inquiring on what medications she can take with the medications she is on at the moment.   Pt seeking clinical advice

## 2023-06-12 ENCOUNTER — Other Ambulatory Visit: Payer: Self-pay | Admitting: Family Medicine

## 2023-06-13 ENCOUNTER — Other Ambulatory Visit: Payer: Self-pay | Admitting: Family Medicine

## 2023-06-13 ENCOUNTER — Telehealth: Payer: Self-pay

## 2023-06-13 ENCOUNTER — Ambulatory Visit: Payer: Self-pay | Admitting: Family Medicine

## 2023-06-13 ENCOUNTER — Ambulatory Visit: Payer: 59 | Admitting: Podiatry

## 2023-06-13 DIAGNOSIS — K219 Gastro-esophageal reflux disease without esophagitis: Secondary | ICD-10-CM

## 2023-06-13 MED ORDER — NIRMATRELVIR/RITONAVIR (PAXLOVID)TABLET: 3.0000 | ORAL_TABLET | Freq: Two times a day (BID) | ORAL | 0 refills | Status: AC

## 2023-06-13 NOTE — Telephone Encounter (Signed)
  Chief Complaint: mild SOB Symptoms: fever, body aches, sore throat, cough Frequency: comes and goes   Disposition: [] ED /[] Urgent Care (no appt availability in office) / [x] Appointment(In office/virtual)/ []  [facility]/ [] Home Care/ [] Refused Recommended Disposition /[] [place] [facility]/ []  Follow-up with PCP Additional Notes: Pt  calling with mild SOB. Pt tested positive for Covid today. Pt became symptomatic last Thursday/Friday. Pt has temp that ranges from 99-101, sore throat, cough, and body aches. Covid has been going around family. Per protocol, pt to be seen with 24 hours. Pt refused appt tomorrow due to transportation issues. Pt stated it takes 4 days to request appt for transportation. Pt is asking for medication to be called in. RN advised pt to be seen and to seek care at an urgent care. RN also advised pt to call 911 with SOB intensifies. RN gave home care advice and Pt verbalized understanding.            Copied from CRM [id number]. Topic: Clinical - Red Word Triage >> Jun 13, 2023  4:19 PM Alessandra Bevels wrote: Red Word that prompted transfer to Nurse Triage: Pt is calling to report that she took Covid test today. Test showed positive. Reporting Sx of diffuculty breathe, temperature (99.0-101.0) and congestion. Requesting medication to treat COVID. Reason for Disposition  [1] MODERATE longstanding difficulty breathing (e.g., speaks in phrases, SOB even at rest, pulse 100-120) AND [2] SAME as normal  Answer Assessment - Initial Assessment Questions 1. RESPIRATORY STATUS: "Describe your breathing?" (e.g., wheezing, shortness of breath, unable to speak, severe coughing)      Mild SOB when walking 2. ONSET: "When did this breathing problem begin?"      Thursday/Friday  3. PATTERN "Does the difficult breathing come and go, or has it been constant since it started?"      Comes and goes  4. SEVERITY: "How bad is your breathing?" (e.g., mild, moderate, severe)    -  MILD: No SOB at rest, mild SOB with walking, speaks normally in sentences, can lie down, no retractions, pulse < 100.    - MODERATE: SOB at rest, SOB with minimal exertion and prefers to sit, cannot lie down flat, speaks in phrases, mild retractions, audible wheezing, pulse 100-120.    - SEVERE: Very SOB at rest, speaks in single words, struggling to breathe, sitting hunched forward, retractions, pulse > 120      Mild  5. RECURRENT SYMPTOM: "Have you had difficulty breathing before?" If Yes, ask: "When was the last time?" and "What happened that time?"      Yes, inhaler 6. CARDIAC HISTORY: "Do you have any history of heart disease?" (e.g., heart attack, angina, bypass surgery, angioplasty)      No  7. LUNG HISTORY: "Do you have any history of lung disease?"  (e.g., pulmonary embolus, asthma, emphysema)     Asthma  8. CAUSE: "What do you think is causing the breathing problem?"      Positive covid test  9. OTHER SYMPTOMS: "Do you have any other symptoms? (e.g., dizziness, runny nose, cough, chest pain, fever)     Sore throat, fever cough, body aches  10. O2 SATURATION MONITOR:  "Do you use an oxygen saturation monitor (pulse oximeter) at home?" If Yes, ask: "What is your reading (oxygen level) today?" "What is your usual oxygen saturation reading?" (e.g., 95%)       na  Protocols used: Breathing Difficulty-A-AH

## 2023-06-13 NOTE — Telephone Encounter (Signed)
 Copied from CRM [id number]. Topic: Clinical - Home Health Verbal Orders >> Jun 10, 2023  4:33 PM Antony Haste wrote: Caller/Agency: Angie with [facility]  Callback Number: [phone] Confidential VM  Angie states [name] [name] has missed her physical therapy session this week, she contacted the patient and was informed her entire household is sick. She will resume her physical therapy session next week.  Any new concerns about the patient? No

## 2023-06-16 ENCOUNTER — Telehealth: Payer: Self-pay

## 2023-06-16 DIAGNOSIS — M25511 Pain in right shoulder: Secondary | ICD-10-CM | POA: Diagnosis not present

## 2023-06-16 DIAGNOSIS — M25552 Pain in left hip: Secondary | ICD-10-CM | POA: Diagnosis not present

## 2023-06-16 DIAGNOSIS — M961 Postlaminectomy syndrome, not elsewhere classified: Secondary | ICD-10-CM | POA: Diagnosis not present

## 2023-06-16 DIAGNOSIS — M25561 Pain in right knee: Secondary | ICD-10-CM | POA: Diagnosis not present

## 2023-06-16 NOTE — Telephone Encounter (Signed)
 LM advising patient it is okay to hold PT until she is better. Mjp,lpn  Copied from CRM [id number]. Topic: Clinical - Medical Advice >> Jun 16, 2023  2:44 PM Tiffany B wrote: Reason for CRM: Caller states patient does not feel up to having PT because patient and her household has COVID.

## 2023-06-21 DIAGNOSIS — J45909 Unspecified asthma, uncomplicated: Secondary | ICD-10-CM | POA: Diagnosis not present

## 2023-06-21 DIAGNOSIS — N182 Chronic kidney disease, stage 2 (mild): Secondary | ICD-10-CM | POA: Diagnosis not present

## 2023-06-21 DIAGNOSIS — R1310 Dysphagia, unspecified: Secondary | ICD-10-CM | POA: Diagnosis not present

## 2023-06-21 DIAGNOSIS — R7303 Prediabetes: Secondary | ICD-10-CM | POA: Diagnosis not present

## 2023-06-21 DIAGNOSIS — R569 Unspecified convulsions: Secondary | ICD-10-CM | POA: Diagnosis not present

## 2023-06-21 DIAGNOSIS — G4733 Obstructive sleep apnea (adult) (pediatric): Secondary | ICD-10-CM | POA: Diagnosis not present

## 2023-06-21 DIAGNOSIS — G894 Chronic pain syndrome: Secondary | ICD-10-CM | POA: Diagnosis not present

## 2023-06-21 DIAGNOSIS — K219 Gastro-esophageal reflux disease without esophagitis: Secondary | ICD-10-CM | POA: Diagnosis not present

## 2023-06-21 DIAGNOSIS — R490 Dysphonia: Secondary | ICD-10-CM | POA: Diagnosis not present

## 2023-06-21 DIAGNOSIS — Z791 Long term (current) use of non-steroidal anti-inflammatories (NSAID): Secondary | ICD-10-CM | POA: Diagnosis not present

## 2023-06-21 DIAGNOSIS — I5032 Chronic diastolic (congestive) heart failure: Secondary | ICD-10-CM | POA: Diagnosis not present

## 2023-06-21 DIAGNOSIS — Z4781 Encounter for orthopedic aftercare following surgical amputation: Secondary | ICD-10-CM | POA: Diagnosis not present

## 2023-06-21 DIAGNOSIS — I130 Hypertensive heart and chronic kidney disease with heart failure and stage 1 through stage 4 chronic kidney disease, or unspecified chronic kidney disease: Secondary | ICD-10-CM | POA: Diagnosis not present

## 2023-06-29 DIAGNOSIS — R490 Dysphonia: Secondary | ICD-10-CM | POA: Diagnosis not present

## 2023-06-29 DIAGNOSIS — R569 Unspecified convulsions: Secondary | ICD-10-CM | POA: Diagnosis not present

## 2023-06-29 DIAGNOSIS — G4733 Obstructive sleep apnea (adult) (pediatric): Secondary | ICD-10-CM | POA: Diagnosis not present

## 2023-06-29 DIAGNOSIS — N182 Chronic kidney disease, stage 2 (mild): Secondary | ICD-10-CM | POA: Diagnosis not present

## 2023-06-29 DIAGNOSIS — I130 Hypertensive heart and chronic kidney disease with heart failure and stage 1 through stage 4 chronic kidney disease, or unspecified chronic kidney disease: Secondary | ICD-10-CM | POA: Diagnosis not present

## 2023-06-29 DIAGNOSIS — J45909 Unspecified asthma, uncomplicated: Secondary | ICD-10-CM | POA: Diagnosis not present

## 2023-06-29 DIAGNOSIS — I5032 Chronic diastolic (congestive) heart failure: Secondary | ICD-10-CM | POA: Diagnosis not present

## 2023-06-29 DIAGNOSIS — Z4781 Encounter for orthopedic aftercare following surgical amputation: Secondary | ICD-10-CM | POA: Diagnosis not present

## 2023-06-29 DIAGNOSIS — R1310 Dysphagia, unspecified: Secondary | ICD-10-CM | POA: Diagnosis not present

## 2023-06-29 DIAGNOSIS — G894 Chronic pain syndrome: Secondary | ICD-10-CM | POA: Diagnosis not present

## 2023-06-29 DIAGNOSIS — K219 Gastro-esophageal reflux disease without esophagitis: Secondary | ICD-10-CM | POA: Diagnosis not present

## 2023-06-29 DIAGNOSIS — Z791 Long term (current) use of non-steroidal anti-inflammatories (NSAID): Secondary | ICD-10-CM | POA: Diagnosis not present

## 2023-06-29 DIAGNOSIS — R7303 Prediabetes: Secondary | ICD-10-CM | POA: Diagnosis not present

## 2023-07-06 DIAGNOSIS — I130 Hypertensive heart and chronic kidney disease with heart failure and stage 1 through stage 4 chronic kidney disease, or unspecified chronic kidney disease: Secondary | ICD-10-CM | POA: Diagnosis not present

## 2023-07-06 DIAGNOSIS — R7303 Prediabetes: Secondary | ICD-10-CM | POA: Diagnosis not present

## 2023-07-06 DIAGNOSIS — R569 Unspecified convulsions: Secondary | ICD-10-CM | POA: Diagnosis not present

## 2023-07-06 DIAGNOSIS — Z4781 Encounter for orthopedic aftercare following surgical amputation: Secondary | ICD-10-CM | POA: Diagnosis not present

## 2023-07-06 DIAGNOSIS — I5032 Chronic diastolic (congestive) heart failure: Secondary | ICD-10-CM | POA: Diagnosis not present

## 2023-07-06 DIAGNOSIS — R1310 Dysphagia, unspecified: Secondary | ICD-10-CM | POA: Diagnosis not present

## 2023-07-06 DIAGNOSIS — G4733 Obstructive sleep apnea (adult) (pediatric): Secondary | ICD-10-CM | POA: Diagnosis not present

## 2023-07-06 DIAGNOSIS — G894 Chronic pain syndrome: Secondary | ICD-10-CM | POA: Diagnosis not present

## 2023-07-06 DIAGNOSIS — J45909 Unspecified asthma, uncomplicated: Secondary | ICD-10-CM | POA: Diagnosis not present

## 2023-07-06 DIAGNOSIS — K219 Gastro-esophageal reflux disease without esophagitis: Secondary | ICD-10-CM | POA: Diagnosis not present

## 2023-07-06 DIAGNOSIS — Z791 Long term (current) use of non-steroidal anti-inflammatories (NSAID): Secondary | ICD-10-CM | POA: Diagnosis not present

## 2023-07-06 DIAGNOSIS — N182 Chronic kidney disease, stage 2 (mild): Secondary | ICD-10-CM | POA: Diagnosis not present

## 2023-07-06 DIAGNOSIS — R490 Dysphonia: Secondary | ICD-10-CM | POA: Diagnosis not present

## 2023-07-14 DIAGNOSIS — Z791 Long term (current) use of non-steroidal anti-inflammatories (NSAID): Secondary | ICD-10-CM | POA: Diagnosis not present

## 2023-07-14 DIAGNOSIS — R569 Unspecified convulsions: Secondary | ICD-10-CM | POA: Diagnosis not present

## 2023-07-14 DIAGNOSIS — G894 Chronic pain syndrome: Secondary | ICD-10-CM | POA: Diagnosis not present

## 2023-07-14 DIAGNOSIS — K219 Gastro-esophageal reflux disease without esophagitis: Secondary | ICD-10-CM | POA: Diagnosis not present

## 2023-07-14 DIAGNOSIS — G4733 Obstructive sleep apnea (adult) (pediatric): Secondary | ICD-10-CM | POA: Diagnosis not present

## 2023-07-14 DIAGNOSIS — R1310 Dysphagia, unspecified: Secondary | ICD-10-CM | POA: Diagnosis not present

## 2023-07-14 DIAGNOSIS — I130 Hypertensive heart and chronic kidney disease with heart failure and stage 1 through stage 4 chronic kidney disease, or unspecified chronic kidney disease: Secondary | ICD-10-CM | POA: Diagnosis not present

## 2023-07-14 DIAGNOSIS — R7303 Prediabetes: Secondary | ICD-10-CM | POA: Diagnosis not present

## 2023-07-14 DIAGNOSIS — I5032 Chronic diastolic (congestive) heart failure: Secondary | ICD-10-CM | POA: Diagnosis not present

## 2023-07-14 DIAGNOSIS — Z4781 Encounter for orthopedic aftercare following surgical amputation: Secondary | ICD-10-CM | POA: Diagnosis not present

## 2023-07-14 DIAGNOSIS — R490 Dysphonia: Secondary | ICD-10-CM | POA: Diagnosis not present

## 2023-07-14 DIAGNOSIS — N182 Chronic kidney disease, stage 2 (mild): Secondary | ICD-10-CM | POA: Diagnosis not present

## 2023-07-14 DIAGNOSIS — J45909 Unspecified asthma, uncomplicated: Secondary | ICD-10-CM | POA: Diagnosis not present

## 2023-07-19 DIAGNOSIS — M25511 Pain in right shoulder: Secondary | ICD-10-CM | POA: Diagnosis not present

## 2023-07-19 DIAGNOSIS — M25559 Pain in unspecified hip: Secondary | ICD-10-CM | POA: Diagnosis not present

## 2023-07-19 DIAGNOSIS — Z79891 Long term (current) use of opiate analgesic: Secondary | ICD-10-CM | POA: Diagnosis not present

## 2023-07-19 DIAGNOSIS — M25519 Pain in unspecified shoulder: Secondary | ICD-10-CM | POA: Diagnosis not present

## 2023-07-19 DIAGNOSIS — M961 Postlaminectomy syndrome, not elsewhere classified: Secondary | ICD-10-CM | POA: Diagnosis not present

## 2023-07-19 DIAGNOSIS — G8929 Other chronic pain: Secondary | ICD-10-CM | POA: Diagnosis not present

## 2023-07-19 DIAGNOSIS — M25561 Pain in right knee: Secondary | ICD-10-CM | POA: Diagnosis not present

## 2023-07-19 DIAGNOSIS — M25552 Pain in left hip: Secondary | ICD-10-CM | POA: Diagnosis not present

## 2023-07-20 ENCOUNTER — Ambulatory Visit: Payer: 59 | Admitting: Podiatry

## 2023-07-22 ENCOUNTER — Ambulatory Visit: Admitting: Family Medicine

## 2023-07-22 NOTE — Patient Outreach (Signed)
 Care Coordination   Follow Up Visit Note   07/22/2023 Late entry for 01/04/23 Name: ESTEE YOHE MRN: [id number] DOB: November 26, 1965  MONTSERRAT SHEK is a 58 y.o. year old female who sees Pickard, Priscille Heidelberg, MD for primary care. I spoke with  [name] by phone today.  What matters to the patients health and wellness today?  Physical therapy, recent amputation, transportation in [place], [place] transportation -[place] [facility], Nurse, children's given by patient for RN CM to outreach to [employer] transit, [employer] transportation    Goals Addressed             This Visit's Progress    Home management of home health services, Congestive heart failure/infected stump ulcer- care coordination services   On track    Interventions Today    Flowsheet Row Most Recent Value  Chronic Disease   Chronic disease during today's visit Diabetes  General Interventions   General Interventions Discussed/Reviewed General Interventions Reviewed, Durable Medical Equipment (DME), [employer], Doctor Visits, Communication with  Doctor Visits Discussed/Reviewed Doctor Visits Reviewed, Specialist  Durable Medical Equipment (DME) Other, Environmental consultant, Psychologist, forensic  [She was able to get her equipment from her mother's home]  Communication with PCP/Specialists  [employer] with patient to [employer] medicaid transportation, [place] transportation, [place], Discussed PARTs services near her son address Entered all resource numbers in patient my shart instructions]  [employer], Other  [Assisted with having [place] Para transit staff review their services and mail an application for patient. Patien was encouraged to call [place] DSS for transfer her medicaid to [place]]  Education Interventions   Education Provided Provided Education  Provided Verbal Education On Applications, Medication, [employer], [employer], Other  [Assisted with having [place] Para transit staff review their services and mail an application for patient. Patien was encouraged to call [place] DSS for transfer her medicaid to [place]]  Mental Health Interventions   Mental Health Discussed/Reviewed Mental Health Reviewed, Coping Strategies  [encouragement provided]              SDOH assessments and interventions completed:  No     Care Coordination Interventions:  Yes, provided   Follow up plan: Follow up call scheduled for 01/18/23    Encounter Outcome:  Patient Visit Completed   [name], RN, BSN, CCM [facility] RN Care Manager [phone]

## 2023-07-29 ENCOUNTER — Ambulatory Visit: Admitting: Family Medicine

## 2023-08-09 ENCOUNTER — Ambulatory Visit: Admitting: Family Medicine

## 2023-08-09 ENCOUNTER — Encounter: Payer: Self-pay | Admitting: Podiatry

## 2023-08-09 ENCOUNTER — Telehealth: Payer: Self-pay | Admitting: Podiatry

## 2023-08-09 ENCOUNTER — Ambulatory Visit (INDEPENDENT_AMBULATORY_CARE_PROVIDER_SITE_OTHER): Admitting: Podiatry

## 2023-08-09 DIAGNOSIS — G629 Polyneuropathy, unspecified: Secondary | ICD-10-CM

## 2023-08-09 DIAGNOSIS — L97512 Non-pressure chronic ulcer of other part of right foot with fat layer exposed: Secondary | ICD-10-CM | POA: Diagnosis not present

## 2023-08-09 NOTE — Telephone Encounter (Signed)
 Hello, patient wanted to let you know that is going to continue her care under Dr.Evans due to she has moved closer to the [place] location. She forgot to inform you on her visit today.

## 2023-08-09 NOTE — Progress Notes (Signed)
 Subjective:  Patient ID: [name] [name], female    DOB: 1966-03-03,  MRN: [id number]  Chief Complaint  Patient presents with   Wound Check    Pt presents for wound check on the right foot, states she noticed a black spot on the bottom of the foot about a week ago pt can't feel much.      58 y.o. female returns for concern of right foot ulcer. She has history of right transmetatarsal amputation and revision. She had been lost to follow-up since February.   Review of Systems: Negative except as noted in the HPI. Denies N/V/F/Ch.  Past Medical History:  Diagnosis Date   Acute metabolic encephalopathy 06/08/2022   AMS (altered mental status) 05/22/2022   Anxiety    Arthritis    Phreesia 02/08/2020   Asthma    mild intermittent   Asthma    Phreesia 02/08/2020   Bipolar 1 disorder ([name])    ect treatments last treatment Sep 02 1011   Depression    Depression    Phreesia 02/08/2020   Depression    Phreesia 07/10/2020   GERD (gastroesophageal reflux disease)    Headache    Hypertension    Pre-diabetes    Seizures ([name])    Thinks it was from taking Tramadol   Septic shock ([name]) 05/22/2022   Shortness of breath 09/23/2016   Overview:   exertional   Sleep apnea    AHI 40 (2024)   Substance abuse ([name])    Phreesia 02/08/2020   Syncope 08/26/2019   Syncope and collapse     Current Outpatient Medications:    busPIRone  (BUSPAR ) 15 MG tablet, Take 15 mg by mouth 3 (three) times daily. , Disp: , Rfl: 2   Calcium  Carbonate-Vit D-Min (CALCIUM  1200 PO), Take 1,200 mg by mouth daily. , Disp: , Rfl:    celecoxib  (CELEBREX ) 100 MG capsule, Take 100 mg by mouth 2 (two) times daily as needed., Disp: , Rfl:    DULoxetine  (CYMBALTA ) 60 MG capsule, TAKE 1 CAPSULE BY MOUTH ONCE DAILY, Disp: 30 capsule, Rfl: 10   Erenumab -aooe (AIMOVIG ) 70 MG/ML SOAJ, INJECT 1 ML SUBCUTANEOUSLY EVERY 30 DAYS, Disp: 1 mL, Rfl: 10   escitalopram  (LEXAPRO ) 10 MG tablet, Take 10 mg by mouth daily., Disp: ,  Rfl:    estradiol  (ESTRACE ) 1 MG tablet, TAKE 1 TABLET BY MOUTH EVERY DAY AT BEDTIME, Disp: 30 tablet, Rfl: 10   famotidine  (PEPCID ) 40 MG tablet, TAKE 1 TABLET BY MOUTH EVERY DAY AT BEDTIME, Disp: 30 tablet, Rfl: 10   ferrous sulfate  (FEROSUL) 325 (65 FE) MG tablet, Take 1 tablet (325 mg total) by mouth 2 (two) times daily with a meal. TAKE 1 TABLET BY MOUTH AT BREAKFAST AND AT BEDTIME, Disp: 60 tablet, Rfl: 3   haloperidol  (HALDOL ) 5 MG tablet, Take 5 mg by mouth at bedtime., Disp: , Rfl:    lamoTRIgine  (LAMICTAL ) 100 MG tablet, Take 100 mg by mouth daily., Disp: , Rfl:    levocetirizine (XYZAL ) 5 MG tablet, TAKE 1 TABLET BY MOUTH EVERY EVENING, Disp: 90 tablet, Rfl: 3   methocarbamol  (ROBAXIN ) 500 MG tablet, Take 500 mg by mouth every 8 (eight) hours as needed., Disp: , Rfl:    metoprolol  succinate (TOPROL -XL) 25 MG 24 hr tablet, Take 25 mg by mouth daily., Disp: , Rfl:    Multiple Vitamins-Minerals (MULTIVITAMIN WITH MINERALS) tablet, Take 1 tablet by mouth daily., Disp: , Rfl:    neomycin -polymyxin-hydrocortisone (CORTISPORIN) OTIC solution, Place 3 drops into  the right ear 4 (four) times daily., Disp: 10 mL, Rfl: 1   oxyCODONE -acetaminophen  (PERCOCET) 10-325 MG tablet, Take 1 tablet by mouth every 6 (six) hours as needed for pain. Take one tablet by mouth up to 4 times daily as needed., Disp: 30 tablet, Rfl: 0   pantoprazole  (PROTONIX ) 40 MG tablet, TAKE 1 TABLET BY MOUTH TWICE DAILY, Disp: 60 tablet, Rfl: 10   polyethylene glycol (MIRALAX  / GLYCOLAX ) 17 g packet, Take 17 g by mouth daily., Disp: , Rfl:    prazosin  (MINIPRESS ) 2 MG capsule, Take 2 mg by mouth at bedtime., Disp: , Rfl:    pregabalin  (LYRICA ) 100 MG capsule, Take 1 capsule (100 mg total) by mouth 3 (three) times daily., Disp: 30 capsule, Rfl: 0   senna-docusate (SENOKOT-S) 8.6-50 MG tablet, Take 1 tablet by mouth at bedtime as needed for mild constipation., Disp: , Rfl:    solifenacin  (VESICARE ) 10 MG tablet, TAKE 1 TABLET BY  MOUTH ONCE DAILY, Disp: 30 tablet, Rfl: 10   SUMAtriptan  (IMITREX ) 50 MG tablet, Take 50 mg by mouth every 2 (two) hours as needed for migraine., Disp: , Rfl:    topiramate  (TOPAMAX ) 50 MG tablet, TAKE 1 TABLET BY MOUTH TWICE DAILY, Disp: 60 tablet, Rfl: 10   traZODone  (DESYREL ) 100 MG tablet, Take 1 tablet (100 mg total) by mouth at bedtime. (Patient taking differently: Take 200 mg by mouth at bedtime. 2 tablets at bedtime.), Disp: 30 tablet, Rfl: 0  Social History   Tobacco Use  Smoking Status Never  Smokeless Tobacco Never    Allergies  Allergen Reactions   Tetracyclines & Related Other (See Comments)    Syncope and put her "in a coma"   Tramadol Other (See Comments)    Seizures   Oxybutynin Other (See Comments)    Unknown reaction per Mom   Phenazopyridine  Other (See Comments)    Unknown reaction   Ciprofloxacin  Rash and Itching   Codeine Itching and Rash   Estradiol  Rash    Patches broke out the skin   Objective:  There were no vitals filed for this visit. There is no height or weight on file to calculate BMI. Vascular: DP/PT pulses 2/4 bilateral. CFT <3 seconds. Absent hair growth on digits. Edema noted to bilateral lower extremities. Xerosis noted bilaterally.  Skin. No lacerations or abrasions bilateral feet. Nails 1-5 bilateral  are thickened discolored and elongated with subungual debris. Ulceration noted to plantar right foot stump. No erythema edema or purulence noted. Measurements below.  Musculoskeletal: MMT 5/5 bilateral lower extremities in DF, PF, Inversion and Eversion. Deceased ROM in DF of ankle joint. Transmetatarsal amputaiton on right   Neurological: Sensation intact to light touch. Protective sensation diminished bilateral.    Radiographs: No osseous erosions noted Interval resection of metatarsals.  No growth of cultures and pathology negative for osteomyelitis  Assessment:   1. 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 Neuropathy        Plan:  Patient was evaluated and treated and all questions answered.  S/p foot surgery right Ulcer plantar right foot stump with fat alyer exposed  -Debridement as below. -Dressed with betadine , DSD. -Off-loading with CAM boot. Dispensed new boot as previous boot no longer functional.  -No abx indicated.  -Discussed glucose control and proper protein-rich diet.  -Discussed if any worsening redness, pain, fever or chills to call or may need to report to the emergency room. Patient expressed understanding.   Procedure: Excisional  Debridement of Wound Rationale: Removal of non-viable soft tissue from the wound to promote healing.  Anesthesia: none Pre-Debridement Wound Measurements: Overlying slough  Post-Debridement Wound Measurements: 0.3 cm x 0.2 cm x 0.2 cm  Type of Debridement: Sharp Excisional Tissue Removed: Non-viable soft tissue Depth of Debridement: subcutaneous tissue. Technique: Sharp excisional debridement to bleeding, viable wound base.  Dressing: Dry, sterile, compression dressing. Disposition: Patient tolerated procedure well. Patient to return in 2 week for follow-up.  No follow-ups on file.    No follow-ups on file.   No follow-ups on file.

## 2023-08-09 NOTE — Telephone Encounter (Signed)
 Hello Doctor. Patient would like to be seen in your care at the [facility] due to moving closer to that location. She has been seeing Dr. Sikora at [facility] office but said she has seen you in the past and would like to continue care with you at the other location.

## 2023-08-10 DIAGNOSIS — L97519 Non-pressure chronic ulcer of other part of right foot with unspecified severity: Secondary | ICD-10-CM | POA: Diagnosis not present

## 2023-08-12 ENCOUNTER — Ambulatory Visit: Admitting: Family Medicine

## 2023-08-18 DIAGNOSIS — M25561 Pain in right knee: Secondary | ICD-10-CM | POA: Diagnosis not present

## 2023-08-18 DIAGNOSIS — M25552 Pain in left hip: Secondary | ICD-10-CM | POA: Diagnosis not present

## 2023-08-18 DIAGNOSIS — M25511 Pain in right shoulder: Secondary | ICD-10-CM | POA: Diagnosis not present

## 2023-08-18 DIAGNOSIS — M961 Postlaminectomy syndrome, not elsewhere classified: Secondary | ICD-10-CM | POA: Diagnosis not present

## 2023-08-23 ENCOUNTER — Ambulatory Visit (INDEPENDENT_AMBULATORY_CARE_PROVIDER_SITE_OTHER): Admitting: Podiatry

## 2023-08-23 ENCOUNTER — Encounter: Payer: Self-pay | Admitting: Podiatry

## 2023-08-23 DIAGNOSIS — S98911D Complete traumatic amputation of right foot, level unspecified, subsequent encounter: Secondary | ICD-10-CM

## 2023-08-23 DIAGNOSIS — L97512 Non-pressure chronic ulcer of other part of right foot with fat layer exposed: Secondary | ICD-10-CM | POA: Diagnosis not present

## 2023-08-23 NOTE — Progress Notes (Signed)
 Chief Complaint  Patient presents with   Wound Check    "I think it's doing okay.  The sore had gotten bigger but I think it has come down some."    Subjective:  58 y.o. female with past history transmetatarsal amputation of the right foot presenting for follow-up evaluation of recurrent ulcer to the amputation stump of the right foot  Past Medical History:  Diagnosis Date   Acute metabolic encephalopathy 06/08/2022   AMS (altered mental status) 05/22/2022   Anxiety    Arthritis    Phreesia 02/08/2020   Asthma    mild intermittent   Asthma    Phreesia 02/08/2020   Bipolar 1 disorder ([name])    ect treatments last treatment Sep 02 1011   Depression    Depression    Phreesia 02/08/2020   Depression    Phreesia 07/10/2020   GERD (gastroesophageal reflux disease)    Headache    Hypertension    Pre-diabetes    Seizures ([name])    Thinks it was from taking Tramadol   Septic shock ([name]) 05/22/2022   Shortness of breath 09/23/2016   Overview:   exertional   Sleep apnea    AHI 40 (2024)   Substance abuse ([name])    Phreesia 02/08/2020   Syncope 08/26/2019   Syncope and collapse     Past Surgical History:  Procedure Laterality Date   ABDOMINAL HYSTERECTOMY     AMPUTATION Right 02/19/2023   Procedure: AMPUTATION FOOT, revision and Incision and drainage;  Surgeon: Jennefer Moats, DPM;  Location: [facility] OR;  Service: Orthopedics/Podiatry;  Laterality: Right;   AMPUTATION TOE Left 02/02/2018   Procedure: AMPUTATION TOE Left 4th toe;  Surgeon: Charity Conch, DPM;  Location: [facility] OR;  Service: Podiatry;  Laterality: Left;   APPENDECTOMY N/A    Phreesia 02/08/2020   BACK SURGERY  2018   ACDF   C5-6 & C6-7 by Dr. Jaquita Merl   CARPAL TUNNEL RELEASE     x2   I & D EXTREMITY Right 05/22/2022   Procedure: IRRIGATION AND DEBRIDEMENT EXTREMITY;  Surgeon: Velma Ghazi, DPM;  Location: [facility] ORS;  Service: Podiatry;  Laterality: Right;   INCISION AND DRAINAGE Right 05/24/2022   Procedure:  RIGHT REVISION INCISION AND DRAINAGE/ WASHOUT WITH PRIMARY DELAYED CLOSURE;  Surgeon: Floyce Hutching, DPM;  Location: [facility] ORS;  Service: Podiatry;  Laterality: Right;   IRRIGATION AND DEBRIDEMENT ABSCESS Right 10/25/2020   Procedure: IRRIGATION AND DEBRIDEMENT ABSCESS OF FOOT AND APPLICATION OF GRAFT;  Surgeon: Charity Conch, DPM;  Location: [facility];  Service: Podiatry;  Laterality: Right;   Laproscopic knee surgery Right    NECK SURGERY  03/15/2017   ACDF   by Dr. Faylene Hoots   PLANTAR FASCIA RELEASE     x2   REVERSE SHOULDER ARTHROPLASTY Right 04/14/2022   Procedure: REVERSE SHOULDER ARTHROPLASTY;  Surgeon: Micheline Ahr, MD;  Location: [facility];  Service: Orthopedics;  Laterality: Right;   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   Procedure: TRANSMETATARSAL AMPUTATION;  Surgeon: Charity Conch, DPM;  Location: [facility];  Service: Podiatry;  Laterality: Right;   TRANSMETATARSAL AMPUTATION Right 05/27/2022   Procedure: REVISION TRANSMETATARSAL AMPUTATION WITH DELAYED PRIMARY CLOSURE;  Surgeon: Dot Gazella, DPM;  Location: [facility] OR[facility];  Service: Podiatry;  Laterality: Right;    Allergies  Allergen Reactions   Tetracyclines & Related Other (See Comments)    Syncope and put her "in a coma"   Tramadol Other (See Comments)  Seizures   Oxybutynin Other (See Comments)    Unknown reaction per Mom   Phenazopyridine  Other (See Comments)    Unknown reaction   Ciprofloxacin  Rash and Itching   Codeine Itching and Rash   Estradiol  Rash    Patches broke out the skin     RT foot 08/23/2023  Objective/Physical Exam General: The patient is alert and oriented x3 in no acute distress.  Dermatology:  Wound #1 noted to the plantar aspect of the right foot amputation stump measuring approximately 1.0 x 1.0 x 0.1 cm (LxWxD).   To the noted ulceration(s), there is no eschar. There is a moderate amount of slough, fibrin, and necrotic tissue noted. Granulation tissue and wound base is red. There is a  minimal amount of serosanguineous drainage noted. There is no exposed bone muscle-tendon ligament or joint. There is no malodor. Periwound integrity is intact. Skin is warm, dry and supple bilateral lower extremities.  Vascular: Pulses palpable.  Skin warm to touch.  Neurological: Light touch and protective threshold diminished bilaterally.   Musculoskeletal Exam: Prior history of transmetatarsal amputation of the right foot with routine healing  Assessment: 1.  Ulcer right foot amputation stump 2.  Peripheral polyneuropathy  3.  History of transmetatarsal amputation right foot   Plan of Care:  -atient was evaluated. -Medically necessary excisional debridement including subcutaneous tissue was performed using a tissue nipper and a chisel blade. Excisional debridement of all the necrotic nonviable tissue down to healthy bleeding viable tissue was performed with post-debridement measurements same as pre-. -The wound was cleansed and dry sterile dressing applied. -Continue Silvadene  cream daily as needed -Continue WBAT CAM boot -Order placed for diabetic shoes with custom molded Plastizote insoles and toe filler to the right foot.  Order sent to [facility] orthotics and prost[facility] in [place] -Return to clinic 3 months   Dot Gazella, DPM [facility]  Dr. Dot Gazella, DPM    [address] [place], [place] [address]                Office [phone]  Fax [phone]

## 2023-08-24 DIAGNOSIS — G4733 Obstructive sleep apnea (adult) (pediatric): Secondary | ICD-10-CM | POA: Diagnosis not present

## 2023-08-25 ENCOUNTER — Encounter (HOSPITAL_COMMUNITY): Payer: Self-pay

## 2023-08-26 ENCOUNTER — Telehealth: Payer: Self-pay | Admitting: Podiatry

## 2023-08-26 NOTE — Telephone Encounter (Signed)
 Faxed diabetic shoe order to [facility]

## 2023-08-31 DIAGNOSIS — M25559 Pain in unspecified hip: Secondary | ICD-10-CM | POA: Diagnosis not present

## 2023-08-31 DIAGNOSIS — G8929 Other chronic pain: Secondary | ICD-10-CM | POA: Diagnosis not present

## 2023-08-31 DIAGNOSIS — Z79891 Long term (current) use of opiate analgesic: Secondary | ICD-10-CM | POA: Diagnosis not present

## 2023-09-01 ENCOUNTER — Ambulatory Visit: Admitting: Family Medicine

## 2023-09-15 DIAGNOSIS — G4733 Obstructive sleep apnea (adult) (pediatric): Secondary | ICD-10-CM | POA: Diagnosis not present

## 2023-09-16 ENCOUNTER — Telehealth: Payer: Self-pay | Admitting: Podiatry

## 2023-09-16 NOTE — Telephone Encounter (Signed)
 Patient stated when bandage was unwrapped she smelled a foul odor. Patient would like for provider to call in a prescription for antibiotic (Bactrim )

## 2023-09-18 DIAGNOSIS — M25552 Pain in left hip: Secondary | ICD-10-CM | POA: Diagnosis not present

## 2023-09-18 DIAGNOSIS — M961 Postlaminectomy syndrome, not elsewhere classified: Secondary | ICD-10-CM | POA: Diagnosis not present

## 2023-09-18 DIAGNOSIS — M25561 Pain in right knee: Secondary | ICD-10-CM | POA: Diagnosis not present

## 2023-09-18 DIAGNOSIS — M25511 Pain in right shoulder: Secondary | ICD-10-CM | POA: Diagnosis not present

## 2023-09-19 ENCOUNTER — Other Ambulatory Visit: Payer: Self-pay | Admitting: Podiatry

## 2023-09-19 MED ORDER — SULFAMETHOXAZOLE-TRIMETHOPRIM 800-160 MG PO TABS: 1.0000 | ORAL_TABLET | Freq: Two times a day (BID) | ORAL | 0 refills | Status: DC

## 2023-09-20 ENCOUNTER — Ambulatory Visit: Admitting: Podiatry

## 2023-09-21 ENCOUNTER — Ambulatory Visit (INDEPENDENT_AMBULATORY_CARE_PROVIDER_SITE_OTHER): Admitting: Podiatry

## 2023-09-21 VITALS — Ht 64.25 in | Wt 237.0 lb

## 2023-09-21 DIAGNOSIS — L97512 Non-pressure chronic ulcer of other part of right foot with fat layer exposed: Secondary | ICD-10-CM

## 2023-09-22 NOTE — Progress Notes (Signed)
 Subjective:   Patient ID: [name] [name], female   DOB: 58 y.o.   MRN: [id number]   HPI Patient presents was concerned about possible ulceration plantar right lateral foot stating there was some drainage and odor but that seems to be better now and she is just starting antibiotics tonight but wants it checked   ROS      Objective:  Physical Exam  Neurovascular status unchanged with patient found to have a amputation of the right foot within the midfoot area with a small area of irritation on the lateral side distal that is localized no proximal edema erythema drainage noted currently     Assessment:  Patient with history of foot surgery and infection right right now appears to be stable     Plan:  H&P reviewed apply cushioning around the area to offload and advised this patient on starting the antibiotics and that this should be uneventful but if any redness any swelling erythema edema to reappoint's or if any systemic signs of infection to go to the emergency room and if she does develop significant infection at 1 point future B-K amputation may be necessary that I made her aware of today

## 2023-09-24 DIAGNOSIS — G4733 Obstructive sleep apnea (adult) (pediatric): Secondary | ICD-10-CM | POA: Diagnosis not present

## 2023-09-27 DIAGNOSIS — Z7951 Long term (current) use of inhaled steroids: Secondary | ICD-10-CM | POA: Diagnosis not present

## 2023-09-27 DIAGNOSIS — Z881 Allergy status to other antibiotic agents status: Secondary | ICD-10-CM | POA: Diagnosis not present

## 2023-09-27 DIAGNOSIS — Z880 Allergy status to penicillin: Secondary | ICD-10-CM | POA: Diagnosis not present

## 2023-09-27 DIAGNOSIS — L97511 Non-pressure chronic ulcer of other part of right foot limited to breakdown of skin: Secondary | ICD-10-CM | POA: Diagnosis not present

## 2023-09-27 DIAGNOSIS — I10 Essential (primary) hypertension: Secondary | ICD-10-CM | POA: Diagnosis not present

## 2023-09-27 DIAGNOSIS — Z885 Allergy status to narcotic agent status: Secondary | ICD-10-CM | POA: Diagnosis not present

## 2023-09-27 DIAGNOSIS — Z79899 Other long term (current) drug therapy: Secondary | ICD-10-CM | POA: Diagnosis not present

## 2023-09-28 DIAGNOSIS — Z79891 Long term (current) use of opiate analgesic: Secondary | ICD-10-CM | POA: Diagnosis not present

## 2023-09-28 DIAGNOSIS — M25559 Pain in unspecified hip: Secondary | ICD-10-CM | POA: Diagnosis not present

## 2023-09-28 DIAGNOSIS — G8929 Other chronic pain: Secondary | ICD-10-CM | POA: Diagnosis not present

## 2023-10-06 DIAGNOSIS — G4733 Obstructive sleep apnea (adult) (pediatric): Secondary | ICD-10-CM | POA: Diagnosis not present

## 2023-10-11 ENCOUNTER — Other Ambulatory Visit: Payer: Self-pay

## 2023-10-11 ENCOUNTER — Emergency Department
Admission: EM | Admit: 2023-10-11 | Discharge: 2023-10-11 | Discharge status: Against Medical Advice | Attending: Emergency Medicine | Admitting: Emergency Medicine

## 2023-10-11 DIAGNOSIS — R109 Unspecified abdominal pain: Secondary | ICD-10-CM | POA: Insufficient documentation

## 2023-10-11 DIAGNOSIS — R0602 Shortness of breath: Secondary | ICD-10-CM | POA: Diagnosis not present

## 2023-10-11 DIAGNOSIS — Z5321 Procedure and treatment not carried out due to patient leaving prior to being seen by health care provider: Secondary | ICD-10-CM | POA: Insufficient documentation

## 2023-10-11 DIAGNOSIS — Z743 Need for continuous supervision: Secondary | ICD-10-CM | POA: Diagnosis not present

## 2023-10-11 DIAGNOSIS — R197 Diarrhea, unspecified: Secondary | ICD-10-CM | POA: Diagnosis not present

## 2023-10-11 DIAGNOSIS — R112 Nausea with vomiting, unspecified: Secondary | ICD-10-CM | POA: Insufficient documentation

## 2023-10-11 LAB — CBC
HCT: 36.3 % (ref 36.0–46.0)
Hemoglobin: 12.7 g/dL (ref 12.0–15.0)
MCH: 31.9 pg (ref 26.0–34.0)
MCHC: 35 g/dL (ref 30.0–36.0)
MCV: 91.2 fL (ref 80.0–100.0)
Platelets: 302 10*3/uL (ref 150–400)
RBC: 3.98 MIL/uL (ref 3.87–5.11)
RDW: 12.8 % (ref 11.5–15.5)
WBC: 9.3 10*3/uL (ref 4.0–10.5)
nRBC: 0 % (ref 0.0–0.2)

## 2023-10-11 LAB — COMPREHENSIVE METABOLIC PANEL WITH GFR
ALT: 14 U/L (ref 0–44)
AST: 29 U/L (ref 15–41)
Albumin: 3.7 g/dL (ref 3.5–5.0)
Alkaline Phosphatase: 75 U/L (ref 38–126)
Anion gap: 12 (ref 5–15)
BUN: 18 mg/dL (ref 6–20)
CO2: 17 mmol/L — ABNORMAL LOW (ref 22–32)
Calcium: 9.2 mg/dL (ref 8.9–10.3)
Chloride: 108 mmol/L (ref 98–111)
Creatinine, Ser: 0.95 mg/dL (ref 0.44–1.00)
GFR, Estimated: >60 mL/min (ref >60)
Glucose, Bld: 108 mg/dL — ABNORMAL HIGH (ref 70–99)
Potassium: 2.9 mmol/L — ABNORMAL LOW (ref 3.5–5.1)
Sodium: 137 mmol/L (ref 135–145)
Total Bilirubin: 0.4 mg/dL (ref 0.0–1.2)
Total Protein: 7.3 g/dL (ref 6.5–8.1)

## 2023-10-11 LAB — LIPASE, BLOOD: Lipase: 25 U/L (ref 11–51)

## 2023-10-11 NOTE — ED Notes (Signed)
 Pt states she is feeling better and wanting to leave AMA. Encouraged pt to stay but pt still wanting to leave. IV removed. Site was clean, dry, and intact.

## 2023-10-11 NOTE — ED Triage Notes (Addendum)
 Pt to ED via EMS from home, pt reports n/d that began today. Pt was given 4mg  IV zofran  by ems. Pt denies vomiting. Pt reports she also has an ulcer to the bottom of her right foot xfew months. Pt has previous amputation to foot.

## 2023-10-12 ENCOUNTER — Telehealth: Payer: Self-pay | Admitting: Family Medicine

## 2023-10-12 NOTE — Telephone Encounter (Unsigned)
 Copied from CRM [id number]. Topic: Clinical - Medical Advice >> Oct 12, 2023  3:52 PM Tiffini S wrote: Reason for CRM: Claris with [name] [phone] called about CPAP machine on or after 09/24/23. Asked for a call back from nurse about the appointments and machine usage.

## 2023-10-13 ENCOUNTER — Ambulatory Visit (INDEPENDENT_AMBULATORY_CARE_PROVIDER_SITE_OTHER): Admitting: Podiatry

## 2023-10-13 ENCOUNTER — Telehealth: Payer: Self-pay | Admitting: Podiatry

## 2023-10-13 DIAGNOSIS — L97512 Non-pressure chronic ulcer of other part of right foot with fat layer exposed: Secondary | ICD-10-CM | POA: Diagnosis not present

## 2023-10-13 DIAGNOSIS — M24571 Contracture, right ankle: Secondary | ICD-10-CM | POA: Diagnosis not present

## 2023-10-13 DIAGNOSIS — Z01818 Encounter for other preprocedural examination: Secondary | ICD-10-CM

## 2023-10-13 NOTE — Telephone Encounter (Signed)
 PER [employer] WEBSITE CPT [id number] DONE IN OFFICE DOES NOT NEED AUTHORIZATION DECISION # [id number] DOS 10/19/23

## 2023-10-13 NOTE — Progress Notes (Signed)
 Subjective:  Patient ID: [name] [name], female    DOB: 1965/06/05,  MRN: [id number]  Chief Complaint  Patient presents with   Wound Check    58 y.o. female presents with the above complaint.  Patient presents with right Chopart amputation with ankle equinus.  Patient states that she started to break open the forefoot because of the equinus present.  She wanted to get it evaluated has not seen MRIs prior to seeing me the wound is currently not infected.  They have been doing iodine dressings to that would like to discuss treatment options for this.  They would like to discuss surgical options to treat the equinus to prevent further amputation   Review of Systems: Negative except as noted in the HPI. Denies N/V/F/Ch.  Past Medical History:  Diagnosis Date   Acute metabolic encephalopathy 06/08/2022   AMS (altered mental status) 05/22/2022   Anxiety    Arthritis    Phreesia 02/08/2020   Asthma    mild intermittent   Asthma    Phreesia 02/08/2020   Bipolar 1 disorder ([name])    ect treatments last treatment Sep 02 1011   Depression    Depression    Phreesia 02/08/2020   Depression    Phreesia 07/10/2020   GERD (gastroesophageal reflux disease)    Headache    Hypertension    Pre-diabetes    Seizures ([name])    Thinks it was from taking Tramadol   Septic shock ([name]) 05/22/2022   Shortness of breath 09/23/2016   Overview:   exertional   Sleep apnea    AHI 40 (2024)   Substance abuse ([name])    Phreesia 02/08/2020   Syncope 08/26/2019   Syncope and collapse     Current Outpatient Medications:    busPIRone  (BUSPAR ) 15 MG tablet, Take 15 mg by mouth 3 (three) times daily. , Disp: , Rfl: 2   Calcium  Carbonate-Vit D-Min (CALCIUM  1200 PO), Take 1,200 mg by mouth daily. , Disp: , Rfl:    celecoxib  (CELEBREX ) 100 MG capsule, Take 100 mg by mouth 2 (two) times daily as needed., Disp: , Rfl:    DULoxetine  (CYMBALTA ) 60 MG capsule, TAKE 1 CAPSULE BY MOUTH ONCE DAILY, Disp: 30 capsule,  Rfl: 10   Erenumab -aooe (AIMOVIG ) 70 MG/ML SOAJ, INJECT 1 ML SUBCUTANEOUSLY EVERY 30 DAYS, Disp: 1 mL, Rfl: 10   escitalopram  (LEXAPRO ) 10 MG tablet, Take 10 mg by mouth daily., Disp: , Rfl:    estradiol  (ESTRACE ) 1 MG tablet, TAKE 1 TABLET BY MOUTH EVERY DAY AT BEDTIME, Disp: 30 tablet, Rfl: 10   famotidine  (PEPCID ) 40 MG tablet, TAKE 1 TABLET BY MOUTH EVERY DAY AT BEDTIME, Disp: 30 tablet, Rfl: 10   ferrous sulfate  (FEROSUL) 325 (65 FE) MG tablet, Take 1 tablet (325 mg total) by mouth 2 (two) times daily with a meal. TAKE 1 TABLET BY MOUTH AT BREAKFAST AND AT BEDTIME, Disp: 60 tablet, Rfl: 3   haloperidol  (HALDOL ) 5 MG tablet, Take 5 mg by mouth at bedtime., Disp: , Rfl:    lamoTRIgine  (LAMICTAL ) 100 MG tablet, Take 100 mg by mouth daily., Disp: , Rfl:    levocetirizine (XYZAL ) 5 MG tablet, TAKE 1 TABLET BY MOUTH EVERY EVENING, Disp: 90 tablet, Rfl: 3   methocarbamol  (ROBAXIN ) 500 MG tablet, Take 500 mg by mouth every 8 (eight) hours as needed., Disp: , Rfl:    metoprolol  succinate (TOPROL -XL) 25 MG 24 hr tablet, Take 25 mg by mouth daily., Disp: , Rfl:  Multiple Vitamins-Minerals (MULTIVITAMIN WITH MINERALS) tablet, Take 1 tablet by mouth daily., Disp: , Rfl:    neomycin -polymyxin-hydrocortisone (CORTISPORIN) OTIC solution, Place 3 drops into the right ear 4 (four) times daily., Disp: 10 mL, Rfl: 1   oxyCODONE -acetaminophen  (PERCOCET) 10-325 MG tablet, Take 1 tablet by mouth every 6 (six) hours as needed for pain. Take one tablet by mouth up to 4 times daily as needed., Disp: 30 tablet, Rfl: 0   pantoprazole  (PROTONIX ) 40 MG tablet, TAKE 1 TABLET BY MOUTH TWICE DAILY, Disp: 60 tablet, Rfl: 10   polyethylene glycol (MIRALAX  / GLYCOLAX ) 17 g packet, Take 17 g by mouth daily., Disp: , Rfl:    prazosin  (MINIPRESS ) 2 MG capsule, Take 2 mg by mouth at bedtime., Disp: , Rfl:    pregabalin  (LYRICA ) 100 MG capsule, Take 1 capsule (100 mg total) by mouth 3 (three) times daily., Disp: 30 capsule, Rfl: 0    senna-docusate (SENOKOT-S) 8.6-50 MG tablet, Take 1 tablet by mouth at bedtime as needed for mild constipation., Disp: , Rfl:    solifenacin  (VESICARE ) 10 MG tablet, TAKE 1 TABLET BY MOUTH ONCE DAILY, Disp: 30 tablet, Rfl: 10   sulfamethoxazole -trimethoprim  (BACTRIM  DS) 800-160 MG tablet, Take 1 tablet by mouth 2 (two) times daily., Disp: 20 tablet, Rfl: 0   SUMAtriptan  (IMITREX ) 50 MG tablet, Take 50 mg by mouth every 2 (two) hours as needed for migraine., Disp: , Rfl:    topiramate  (TOPAMAX ) 50 MG tablet, TAKE 1 TABLET BY MOUTH TWICE DAILY, Disp: 60 tablet, Rfl: 10   traZODone  (DESYREL ) 100 MG tablet, Take 1 tablet (100 mg total) by mouth at bedtime. (Patient taking differently: Take 200 mg by mouth at bedtime. 2 tablets at bedtime.), Disp: 30 tablet, Rfl: 0  Social History   Tobacco Use  Smoking Status Never  Smokeless Tobacco Never    Allergies  Allergen Reactions   Tetracyclines & Related Other (See Comments)    Syncope and put her in a coma   Tramadol Other (See Comments)    Seizures   Oxybutynin Other (See Comments)    Unknown reaction per Mom   Phenazopyridine  Other (See Comments)    Unknown reaction   Ciprofloxacin  Rash and Itching   Codeine Itching and Rash   Estradiol  Rash    Patches broke out the skin   Objective:  There were no vitals filed for this visit. There is no height or weight on file to calculate BMI. Constitutional Well developed. Well nourished.  Vascular Dorsalis pedis pulses palpable bilaterally. Posterior tibial pulses palpable bilaterally. Capillary refill normal to all digits.  No cyanosis or clubbing noted. Pedal hair growth normal.  Neurologic Normal speech. Oriented to person, place, and time. Epicritic sensation to light touch grossly present bilaterally.  Dermatologic Nails well groomed and normal in appearance. No open wounds. No skin lesions.  Orthopedic: Right ankle equinus noted with gastrocsoleus involvement.  Patient has a  history of Chopart amputation.  Superficial wound breakdown noted to the amputation stump plantar lateral due to tight equinus contracture   Radiographs: None Assessment:   1. 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 Equinus contracture of right ankle   3. Encounter for preoperative examination for general surgical procedure    Plan:  Patient was evaluated and treated and all questions answered.  Right ankle equinus - All questions and concerns were discussed with the patient in extensive detail - Given the amount of equinus that is present in setting of  ulceration that is breaking down the skin.  Patient will benefit from Achilles tendon lengthening percutaneously. - I discussed my preoperative postoperative plan with the patient in extensive detail she states understanding and would like to proceed with lengthening of the Achilles tendon -Informed surgical risk consent was reviewed and read aloud to the patient.  I reviewed the films.  I have discussed my findings with the patient in great detail.  I have discussed all risks including but not limited to infection, stiffness, scarring, limp, disability, deformity, damage to blood vessels and nerves, numbness, poor healing, need for braces, arthritis, chronic pain, amputation, death.  All benefits and realistic expectations discussed in great detail.  I have made no promises as to the outcome.  I have provided realistic expectations.  I have offered the patient a 2nd opinion, which they have declined and assured me they preferred to proceed despite the risks  Right superficial ulceration to the lateral Chopart amputation site - Continue Betadine wet-to-dry dressing clinically not infected very superficial in nature.  At this time we will continue monitoring and hopefully the above procedure will help with healing quickly  No follow-ups on file.

## 2023-10-14 ENCOUNTER — Inpatient Hospital Stay: Admitting: Family Medicine

## 2023-10-14 ENCOUNTER — Telehealth: Payer: Self-pay

## 2023-10-14 DIAGNOSIS — I872 Venous insufficiency (chronic) (peripheral): Secondary | ICD-10-CM | POA: Diagnosis not present

## 2023-10-14 NOTE — Telephone Encounter (Signed)
-----   Message from Franky SHAUNNA Blanch sent at 10/14/2023  9:22 AM EDT ----- Regarding: Home supplies Hi   Can you give this patient home care supplies for 4 x 4 gauze Kerlix Ace bandage Betadine saline bottle

## 2023-10-14 NOTE — Telephone Encounter (Signed)
 Order form, office note and demographics faxed to [facility] 

## 2023-10-14 NOTE — Telephone Encounter (Signed)
 Spoke with [facility] and informed them that pt has an appt today.

## 2023-10-18 ENCOUNTER — Telehealth: Payer: Self-pay | Admitting: Podiatry

## 2023-10-18 DIAGNOSIS — M25511 Pain in right shoulder: Secondary | ICD-10-CM | POA: Diagnosis not present

## 2023-10-18 DIAGNOSIS — M25552 Pain in left hip: Secondary | ICD-10-CM | POA: Diagnosis not present

## 2023-10-18 DIAGNOSIS — M25561 Pain in right knee: Secondary | ICD-10-CM | POA: Diagnosis not present

## 2023-10-18 DIAGNOSIS — M961 Postlaminectomy syndrome, not elsewhere classified: Secondary | ICD-10-CM | POA: Diagnosis not present

## 2023-10-18 NOTE — Telephone Encounter (Signed)
 PER [employer]HC WEBSITE NO AUTH IS NOT REQUIRED FOR CPT [id number].  IN OFFICE PROCEDURE  DECISION # [id number]

## 2023-10-19 ENCOUNTER — Ambulatory Visit (INDEPENDENT_AMBULATORY_CARE_PROVIDER_SITE_OTHER): Admitting: Podiatry

## 2023-10-19 DIAGNOSIS — M24571 Contracture, right ankle: Secondary | ICD-10-CM

## 2023-10-19 NOTE — Progress Notes (Signed)
 Surgeon: Dr. Franky Blanch  Assistants: None Pre-operative diagnosis: * No surgery found *  Post-operative diagnosis: same Procedure: Right Achilles percutaneous lengthening Pathology: None Pertinent Intra-op findings: Gastrocsoleus equinus noted leading to transmetatarsal amputation site wound Anesthesia: 10 cc of half percent lidocaine  plain Hemostasis: 5 cc and calf tourniquet for 10 minutes EBL: Minimal 10 cc Materials: 3-0 Prolene Injectables: None Complications: None  Indications for surgery: A 58 y.o. female presents with right gastrocsoleus Achilles tightness noted. Patient has failed all conservative therapy including but not limited to shoe gear modification padding protecting offloading. She wishes to have surgical correction of the foot/deformity. It was determined that patient would benefit from right Achilles percutaneous tendon lengthening. Informed surgical risk consent was reviewed and read aloud to the patient.  I reviewed the films.  I have discussed my findings with the patient in great detail.  I have discussed all risks including but not limited to infection, stiffness, scarring, limp, disability, deformity, damage to blood vessels and nerves, numbness, poor healing, need for braces, arthritis, chronic pain, amputation, death.  All benefits and realistic expectations discussed in great detail.  I have made no promises as to the outcome.  I have provided realistic expectations.  I have offered the patient a 2nd opinion, which they have declined and assured me they preferred to proceed despite the risks   Procedure in detail: The patient was both verbally and visually identified by myself, the nursing staff, and anesthesia staff in the preoperative holding area. They were then transferred to the operating room and placed on the operative table in supine position.  Attention was directed to the posterior Achilles, Betadine soaked gauze was applied to the skin for prepping.   Triple hemisection Hoke stab incision was delineated using skin marker.  Using #15 blade the incision was carried down from the central belly of the tendon and partially detached medially laterally and medially in standard technique.  No complete detachment noted.  Lengthening of the Achilles tendon noted.  The incision was dressed with 3-0 Prolene.  The incision was dressed with 4 x 4 gauze Kerlix Ace bandage.  Good range of motion noted at the ankle joint.  At the conclusion of the procedure the patient was awoken from anesthesia and found to have tolerated the procedure well any complications. There were transferred to PACU with vital signs stable and vascular status intact.  Gilmer Kaminsky, DPM

## 2023-10-23 ENCOUNTER — Other Ambulatory Visit: Payer: Self-pay | Admitting: Podiatry

## 2023-10-24 ENCOUNTER — Inpatient Hospital Stay: Admitting: Family Medicine

## 2023-10-24 DIAGNOSIS — G4733 Obstructive sleep apnea (adult) (pediatric): Secondary | ICD-10-CM | POA: Diagnosis not present

## 2023-10-25 ENCOUNTER — Encounter: Admitting: Podiatry

## 2023-10-27 ENCOUNTER — Other Ambulatory Visit: Payer: Self-pay | Admitting: Family Medicine

## 2023-10-27 ENCOUNTER — Ambulatory Visit: Payer: Self-pay | Admitting: *Deleted

## 2023-10-27 MED ORDER — CEPHALEXIN 500 MG PO CAPS: 500.0000 mg | ORAL_CAPSULE | Freq: Three times a day (TID) | ORAL | 0 refills | Status: DC
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
## 2023-10-28 ENCOUNTER — Ambulatory Visit: Admitting: Family Medicine

## 2023-10-28 NOTE — Telephone Encounter (Signed)
 Pt informed and verbalized understanding

## 2023-10-29 ENCOUNTER — Encounter: Payer: Self-pay | Admitting: Family Medicine

## 2023-11-02 ENCOUNTER — Telehealth: Payer: Self-pay | Admitting: Neurology

## 2023-11-02 DIAGNOSIS — G4733 Obstructive sleep apnea (adult) (pediatric): Secondary | ICD-10-CM | POA: Diagnosis not present

## 2023-11-02 NOTE — Telephone Encounter (Signed)
 Pt has called to schedule her initial CPAP f/u,

## 2023-11-02 NOTE — Telephone Encounter (Signed)
 Spoke to patient received new cpap machine September 19 2023  Gave patient initial f/u 11/2023 with Sarah,NP Pt thanked me for calling

## 2023-11-03 ENCOUNTER — Encounter: Payer: Self-pay | Admitting: Family Medicine

## 2023-11-03 ENCOUNTER — Ambulatory Visit: Admitting: Family Medicine

## 2023-11-03 VITALS — BP 128/80 | HR 72 | Temp 98.1°F | Ht 64.25 in | Wt 233.4 lb

## 2023-11-03 DIAGNOSIS — L97511 Non-pressure chronic ulcer of other part of right foot limited to breakdown of skin: Secondary | ICD-10-CM | POA: Diagnosis not present

## 2023-11-03 DIAGNOSIS — E876 Hypokalemia: Secondary | ICD-10-CM | POA: Diagnosis not present

## 2023-11-03 DIAGNOSIS — M2550 Pain in unspecified joint: Secondary | ICD-10-CM | POA: Diagnosis not present

## 2023-11-03 LAB — BASIC METABOLIC PANEL WITHOUT GFR
BUN: 17 mg/dL (ref 7–25)
CO2: 24 mmol/L (ref 20–32)
Calcium: 9.6 mg/dL (ref 8.6–10.4)
Chloride: 106 mmol/L (ref 98–110)
Creat: 0.9 mg/dL (ref 0.50–1.05)
Glucose, Bld: 94 mg/dL (ref 65–99)
Potassium: 4 mmol/L (ref 3.5–5.3)
Sodium: 139 mmol/L (ref 135–146)

## 2023-11-03 LAB — MAGNESIUM: Magnesium: 1.8 mg/dL (ref 1.5–2.5)

## 2023-11-03 LAB — URIC ACID: Uric Acid, Serum: 4.7 mg/dL (ref 2.5–7.0)

## 2023-11-03 NOTE — Progress Notes (Signed)
 Subjective:    Patient ID: [name] [name], female    DOB: Apr 02, 1966, 58 y.o.   MRN: [id number]  Patient recently went to the hospital for an ulcer on the tip of her right foot.  She has had amputations of the toes.  She reports an ulcer on the 4th and 5th digits on the plantar surface that will not heal.  She is seeing podiatry in [place].  She would like a referral to podiatry and [facility].  She states that she has been battling this for years and she requests a second opinion.  While at the hospital, her calcium  level was found to be low at 2.9.  She wants to recheck this today.  She does not take any diuretic.  She also is concerned that she may have gout.  She reports severe pain in the joints of her hands and in her mid feet.  These occur spontaneously and without reason.  She does not even want people to touch the skin where it hurts.  The pain then improves gradually on its own.  She has no history of gout Past Medical History:  Diagnosis Date   Acute metabolic encephalopathy 06/08/2022   AMS (altered mental status) 05/22/2022   Anxiety    Arthritis    Phreesia 02/08/2020   Asthma    mild intermittent   Asthma    Phreesia 02/08/2020   Bipolar 1 disorder ([name])    ect treatments last treatment Sep 02 1011   Depression    Depression    Phreesia 02/08/2020   Depression    Phreesia 07/10/2020   GERD (gastroesophageal reflux disease)    Headache    Hypertension    Pre-diabetes    Seizures ([name])    Thinks it was from taking Tramadol   Septic shock ([name]) 05/22/2022   Shortness of breath 09/23/2016   Overview:   exertional   Sleep apnea    AHI 40 (2024)   Substance abuse ([name])    Phreesia 02/08/2020   Syncope 08/26/2019   Syncope and collapse    Past Surgical History:  Procedure Laterality Date   ABDOMINAL HYSTERECTOMY     AMPUTATION Right 02/19/2023   Procedure: AMPUTATION FOOT, revision and Incision and drainage;  Surgeon: Joya Stabs, DPM;  Location: [facility] OR;  Service:  Orthopedics/Podiatry;  Laterality: Right;   AMPUTATION TOE Left 02/02/2018   Procedure: AMPUTATION TOE Left 4th toe;  Surgeon: Gershon Donnice SAUNDERS, DPM;  Location: [facility] OR;  Service: Podiatry;  Laterality: Left;   APPENDECTOMY N/A    Phreesia 02/08/2020   BACK SURGERY  2018   ACDF   C5-6 & C6-7 by Dr. Royden Schneider   CARPAL TUNNEL RELEASE     x2   I & D EXTREMITY Right 05/22/2022   Procedure: IRRIGATION AND DEBRIDEMENT EXTREMITY;  Surgeon: Tobie Franky SQUIBB, DPM;  Location: [facility] ORS;  Service: Podiatry;  Laterality: Right;   INCISION AND DRAINAGE Right 05/24/2022   Procedure: RIGHT REVISION INCISION AND DRAINAGE/ WASHOUT WITH PRIMARY DELAYED CLOSURE;  Surgeon: Silva Juliene SAUNDERS, DPM;  Location: [facility] ORS;  Service: Podiatry;  Laterality: Right;   IRRIGATION AND DEBRIDEMENT ABSCESS Right 10/25/2020   Procedure: IRRIGATION AND DEBRIDEMENT ABSCESS OF FOOT AND APPLICATION OF GRAFT;  Surgeon: Gershon Donnice SAUNDERS, DPM;  Location: [facility] OR;  Service: Podiatry;  Laterality: Right;   Laproscopic knee surgery Right    NECK SURGERY  03/15/2017   ACDF   by Dr. Schneider   PLANTAR FASCIA RELEASE  x2   REVERSE SHOULDER ARTHROPLASTY Right 04/14/2022   Procedure: REVERSE SHOULDER ARTHROPLASTY;  Surgeon: Cristy Bonner DASEN, MD;  Location: [facility]S;  Service: Orthopedics;  Laterality: Right;   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   Procedure: TRANSMETATARSAL AMPUTATION;  Surgeon: Gershon Donnice SAUNDERS, DPM;  Location: [facility];  Service: Podiatry;  Laterality: Right;   TRANSMETATARSAL AMPUTATION Right 05/27/2022   Procedure: REVISION TRANSMETATARSAL AMPUTATION WITH DELAYED PRIMARY CLOSURE;  Surgeon: Janit Thresa HERO, DPM;  Location: [facility] ORS;  Service: Podiatry;  Laterality: Right;   Current Outpatient Medications on File Prior to Visit  Medication Sig Dispense Refill   busPIRone  (BUSPAR ) 30 MG tablet Take 30 mg by mouth 2 (two) times daily.     Calcium  Carbonate-Vit D-Min (CALCIUM  1200 PO) Take 1,200 mg by mouth daily.       celecoxib  (CELEBREX ) 100 MG capsule Take 100 mg by mouth 2 (two) times daily as needed.     cephALEXin  (KEFLEX ) 500 MG capsule Take 1 capsule (500 mg total) by mouth 3 (three) times daily. 21 capsule 0   DULoxetine  (CYMBALTA ) 60 MG capsule TAKE 1 CAPSULE BY MOUTH ONCE DAILY 30 capsule 10   Erenumab -aooe (AIMOVIG ) 70 MG/ML SOAJ INJECT 1 ML SUBCUTANEOUSLY EVERY 30 DAYS 1 mL 10   escitalopram  (LEXAPRO ) 10 MG tablet Take 10 mg by mouth daily.     estradiol  (ESTRACE ) 1 MG tablet TAKE 1 TABLET BY MOUTH EVERY DAY AT BEDTIME 30 tablet 10   famotidine  (PEPCID ) 40 MG tablet TAKE 1 TABLET BY MOUTH EVERY DAY AT BEDTIME 30 tablet 10   ferrous sulfate  (FEROSUL) 325 (65 FE) MG tablet Take 1 tablet (325 mg total) by mouth 2 (two) times daily with a meal. TAKE 1 TABLET BY MOUTH AT BREAKFAST AND AT BEDTIME 60 tablet 3   haloperidol  (HALDOL ) 5 MG tablet Take 5 mg by mouth at bedtime.     lamoTRIgine  (LAMICTAL ) 100 MG tablet Take 100 mg by mouth daily.     levocetirizine (XYZAL ) 5 MG tablet TAKE 1 TABLET BY MOUTH EVERY EVENING 90 tablet 3   methocarbamol  (ROBAXIN ) 500 MG tablet Take 500 mg by mouth every 8 (eight) hours as needed.     Multiple Vitamins-Minerals (MULTIVITAMIN WITH MINERALS) tablet Take 1 tablet by mouth daily.     oxyCODONE -acetaminophen  (PERCOCET) 10-325 MG tablet Take 1 tablet by mouth every 6 (six) hours as needed for pain. Take one tablet by mouth up to 4 times daily as needed. 30 tablet 0   pantoprazole  (PROTONIX ) 40 MG tablet TAKE 1 TABLET BY MOUTH TWICE DAILY 60 tablet 10   polyethylene glycol (MIRALAX  / GLYCOLAX ) 17 g packet Take 17 g by mouth daily.     prazosin  (MINIPRESS ) 5 MG capsule Take 5 mg by mouth at bedtime.     pregabalin  (LYRICA ) 100 MG capsule Take 1 capsule (100 mg total) by mouth 3 (three) times daily. 30 capsule 0   solifenacin  (VESICARE ) 10 MG tablet TAKE 1 TABLET BY MOUTH ONCE DAILY 30 tablet 10   sulfamethoxazole -trimethoprim  (BACTRIM  DS) 800-160 MG tablet Take 1 tablet by  mouth 2 (two) times daily. 20 tablet 0   SUMAtriptan  (IMITREX ) 50 MG tablet Take 50 mg by mouth every 2 (two) hours as needed for migraine.     topiramate  (TOPAMAX ) 50 MG tablet TAKE 1 TABLET BY MOUTH TWICE DAILY 60 tablet 10   traZODone  (DESYREL ) 100 MG tablet Take 1 tablet (100 mg total) by mouth at bedtime. (Patient taking differently: Take 200 mg by  mouth at bedtime. 2 tablets at bedtime.) 30 tablet 0   busPIRone  (BUSPAR ) 15 MG tablet Take 15 mg by mouth 3 (three) times daily.  (Patient not taking: Reported on 11/03/2023)  2   prazosin  (MINIPRESS ) 2 MG capsule Take 2 mg by mouth at bedtime. (Patient not taking: Reported on 11/03/2023)     No current facility-administered medications on file prior to visit.   Allergies  Allergen Reactions   Tetracyclines & Related Other (See Comments)    Syncope and put her in a coma   Tramadol Other (See Comments)    Seizures   Oxybutynin Other (See Comments)    Unknown reaction per Mom   Phenazopyridine  Other (See Comments)    Unknown reaction   Ciprofloxacin  Rash and Itching   Codeine Itching and Rash   Estradiol  Rash    Patches broke out the skin   Social History   Socioeconomic History   Marital status: Widowed    Spouse name: Not on file   Number of children: 3   Years of education: 27   Highest education level: Not on file  Occupational History   Occupation: Disabled  Tobacco Use   Smoking status: Never   Smokeless tobacco: Never  Vaping Use   Vaping status: Never Used  Substance and Sexual Activity   Alcohol  use: No   Drug use: No   Sexual activity: Not Currently  Other Topics Concern   Not on file  Social History Narrative   Patient drink 4 16oz caffeine  drinks a day    Social Drivers of Corporate investment banker Strain: Low Risk  (07/27/2022)   Overall Financial Resource Strain (CARDIA)    Difficulty of Paying Living Expenses: Not hard at all  Food Insecurity: No Food Insecurity (02/18/2023)   Hunger Vital Sign     Worried About Running Out of Food in the Last Year: Never true    Ran Out of Food in the Last Year: Never true  Transportation Needs: No Transportation Needs (02/18/2023)   PRAPARE - Administrator, Civil Service (Medical): No    Lack of Transportation (Non-Medical): No  Physical Activity: Inactive (07/26/2022)   Exercise Vital Sign    Days of Exercise per Week: 0 days    Minutes of Exercise per Session: 0 min  Stress: No Stress Concern Present (11/19/2022)   [employer] of Occupational Health - Occupational Stress Questionnaire    Feeling of Stress : Not at all  Social Connections: Unknown (08/30/2021)   Received from [facility]   Social Network    Social Network: Not on file  Intimate Partner Violence: Not At Risk (09/27/2023)   Received from [facility]   Humiliation, Afraid, Rape, and Kick questionnaire    Within the last year, have you been afraid of your partner or ex-partner?: No    Within the last year, have you been humiliated or emotionally abused in other ways by your partner or ex-partner?: No    Within the last year, have you been kicked, hit, slapped, or otherwise physically hurt by your partner or ex-partner?: No    Within the last year, have you been raped or forced to have any kind of sexual activity by your partner or ex-partner?: No   Family History  Problem Relation Age of Onset   Diabetes Mother    COPD Mother    Hypertension Mother    Hyperlipidemia Mother    Sleep apnea Mother    Heart  disease Father    Hyperlipidemia Father    Hypertension Father    Cancer Father    Sleep apnea Father    Heart disease Brother    Sleep apnea Brother    Heart disease Maternal Grandmother    Hypertension Maternal Grandmother    Sleep apnea Maternal Grandmother    Heart disease Maternal Grandfather    Kidney cancer Paternal Grandmother    Heart disease Paternal Grandfather    Heart disease Daughter       Review of Systems  All other systems  reviewed and are negative.      Objective:   Physical Exam Vitals reviewed.  Constitutional:      General: She is not in acute distress.    Appearance: She is well-developed. She is not diaphoretic.  Neck:     Thyroid : No thyromegaly.     Trachea: No tracheal deviation.  Cardiovascular:     Rate and Rhythm: Normal rate and regular rhythm.     Heart sounds: Normal heart sounds. No murmur heard.    No friction rub. No gallop.  Pulmonary:     Effort: Pulmonary effort is normal. No respiratory distress.     Breath sounds: No stridor. No decreased breath sounds, wheezing, rhonchi or rales.  Chest:     Chest wall: No tenderness.  Neurological:     Mental Status: She is alert.     Motor: No abnormal muscle tone.     Deep Tendon Reflexes: Reflexes are normal and symmetric.       Assessment & Plan:  Hypokalemia - Plan: Basic Metabolic Panel Without GFR, Magnesium   Arthralgia, unspecified joint - Plan: Uric acid  Ulcer of right foot, limited to breakdown of skin ([name]) - Plan: Ambulatory referral to Podiatry Patient would like to see a different podiatrist for a second opinion due to the nonhealing ulcer of the right foot.  She is currently in a postoperative shoe and has been made nonweightbearing.  I will consult podiatry as requested.  Due to the hypokalemia I will check a magnesium  and potassium level.  Due to the arthralgias I will check a uric acid level but I suspect this is more likely due to neuropathy.

## 2023-11-04 ENCOUNTER — Ambulatory Visit: Payer: Self-pay | Admitting: Family Medicine

## 2023-11-08 ENCOUNTER — Ambulatory Visit (INDEPENDENT_AMBULATORY_CARE_PROVIDER_SITE_OTHER): Admitting: Podiatry

## 2023-11-08 DIAGNOSIS — M24571 Contracture, right ankle: Secondary | ICD-10-CM

## 2023-11-08 DIAGNOSIS — L97512 Non-pressure chronic ulcer of other part of right foot with fat layer exposed: Secondary | ICD-10-CM

## 2023-11-08 NOTE — Progress Notes (Signed)
 Subjective:  Patient ID: [name] [name], female    DOB: 1965-08-15,  MRN: [id number]  Chief Complaint  Patient presents with   Routine Post Op    POV # 2 DOS 10/19/23 RT ACHILLES TENDON LENGTHENING    DOS: 10/19/2023 Procedure: Right Achilles tendon lengthening  58 y.o. female returns for post-op check.  She states she is doing well.  Pain is controlled.  She has ulceration to the right transmetatarsal site.  Doing okay she has not been putting anything else on it besides Silvadene .  Review of Systems: Negative except as noted in the HPI. Denies N/V/F/Ch.  Past Medical History:  Diagnosis Date   Acute metabolic encephalopathy 06/08/2022   AMS (altered mental status) 05/22/2022   Anxiety    Arthritis    Phreesia 02/08/2020   Asthma    mild intermittent   Asthma    Phreesia 02/08/2020   Bipolar 1 disorder ([name])    ect treatments last treatment Sep 02 1011   Depression    Depression    Phreesia 02/08/2020   Depression    Phreesia 07/10/2020   GERD (gastroesophageal reflux disease)    Headache    Hypertension    Pre-diabetes    Seizures ([name])    Thinks it was from taking Tramadol   Septic shock ([name]) 05/22/2022   Shortness of breath 09/23/2016   Overview:   exertional   Sleep apnea    AHI 40 (2024)   Substance abuse ([name])    Phreesia 02/08/2020   Syncope 08/26/2019   Syncope and collapse     Current Outpatient Medications:    busPIRone  (BUSPAR ) 15 MG tablet, Take 15 mg by mouth 3 (three) times daily.  (Patient not taking: Reported on 11/03/2023), Disp: , Rfl: 2   busPIRone  (BUSPAR ) 30 MG tablet, Take 30 mg by mouth 2 (two) times daily., Disp: , Rfl:    Calcium  Carbonate-Vit D-Min (CALCIUM  1200 PO), Take 1,200 mg by mouth daily. , Disp: , Rfl:    celecoxib  (CELEBREX ) 100 MG capsule, Take 100 mg by mouth 2 (two) times daily as needed., Disp: , Rfl:    cephALEXin  (KEFLEX ) 500 MG capsule, Take 1 capsule (500 mg total) by mouth 3 (three) times daily., Disp: 21 capsule,  Rfl: 0   DULoxetine  (CYMBALTA ) 60 MG capsule, TAKE 1 CAPSULE BY MOUTH ONCE DAILY, Disp: 30 capsule, Rfl: 10   Erenumab -aooe (AIMOVIG ) 70 MG/ML SOAJ, INJECT 1 ML SUBCUTANEOUSLY EVERY 30 DAYS, Disp: 1 mL, Rfl: 10   escitalopram  (LEXAPRO ) 10 MG tablet, Take 10 mg by mouth daily., Disp: , Rfl:    estradiol  (ESTRACE ) 1 MG tablet, TAKE 1 TABLET BY MOUTH EVERY DAY AT BEDTIME, Disp: 30 tablet, Rfl: 10   famotidine  (PEPCID ) 40 MG tablet, TAKE 1 TABLET BY MOUTH EVERY DAY AT BEDTIME, Disp: 30 tablet, Rfl: 10   ferrous sulfate  (FEROSUL) 325 (65 FE) MG tablet, Take 1 tablet (325 mg total) by mouth 2 (two) times daily with a meal. TAKE 1 TABLET BY MOUTH AT BREAKFAST AND AT BEDTIME, Disp: 60 tablet, Rfl: 3   haloperidol  (HALDOL ) 5 MG tablet, Take 5 mg by mouth at bedtime., Disp: , Rfl:    lamoTRIgine  (LAMICTAL ) 100 MG tablet, Take 100 mg by mouth daily., Disp: , Rfl:    levocetirizine (XYZAL ) 5 MG tablet, TAKE 1 TABLET BY MOUTH EVERY EVENING, Disp: 90 tablet, Rfl: 3   methocarbamol  (ROBAXIN ) 500 MG tablet, Take 500 mg by mouth every 8 (eight) hours as needed., Disp: ,  Rfl:    Multiple Vitamins-Minerals (MULTIVITAMIN WITH MINERALS) tablet, Take 1 tablet by mouth daily., Disp: , Rfl:    oxyCODONE -acetaminophen  (PERCOCET) 10-325 MG tablet, Take 1 tablet by mouth every 6 (six) hours as needed for pain. Take one tablet by mouth up to 4 times daily as needed., Disp: 30 tablet, Rfl: 0   pantoprazole  (PROTONIX ) 40 MG tablet, TAKE 1 TABLET BY MOUTH TWICE DAILY, Disp: 60 tablet, Rfl: 10   polyethylene glycol (MIRALAX  / GLYCOLAX ) 17 g packet, Take 17 g by mouth daily., Disp: , Rfl:    prazosin  (MINIPRESS ) 2 MG capsule, Take 2 mg by mouth at bedtime. (Patient not taking: Reported on 11/03/2023), Disp: , Rfl:    prazosin  (MINIPRESS ) 5 MG capsule, Take 5 mg by mouth at bedtime., Disp: , Rfl:    pregabalin  (LYRICA ) 100 MG capsule, Take 1 capsule (100 mg total) by mouth 3 (three) times daily., Disp: 30 capsule, Rfl: 0    solifenacin  (VESICARE ) 10 MG tablet, TAKE 1 TABLET BY MOUTH ONCE DAILY, Disp: 30 tablet, Rfl: 10   sulfamethoxazole -trimethoprim  (BACTRIM  DS) 800-160 MG tablet, Take 1 tablet by mouth 2 (two) times daily., Disp: 20 tablet, Rfl: 0   SUMAtriptan  (IMITREX ) 50 MG tablet, Take 50 mg by mouth every 2 (two) hours as needed for migraine., Disp: , Rfl:    topiramate  (TOPAMAX ) 50 MG tablet, TAKE 1 TABLET BY MOUTH TWICE DAILY, Disp: 60 tablet, Rfl: 10   traZODone  (DESYREL ) 100 MG tablet, Take 1 tablet (100 mg total) by mouth at bedtime. (Patient taking differently: Take 200 mg by mouth at bedtime. 2 tablets at bedtime.), Disp: 30 tablet, Rfl: 0  Social History   Tobacco Use  Smoking Status Never  Smokeless Tobacco Never    Allergies  Allergen Reactions   Tetracyclines & Related Other (See Comments)    Syncope and put her in a coma   Tramadol Other (See Comments)    Seizures   Oxybutynin Other (See Comments)    Unknown reaction per Mom   Phenazopyridine  Other (See Comments)    Unknown reaction   Ciprofloxacin  Rash and Itching   Codeine Itching and Rash   Estradiol  Rash    Patches broke out the skin   Objective:  There were no vitals filed for this visit. There is no height or weight on file to calculate BMI. Constitutional Well developed. Well nourished.  Vascular Foot warm and well perfused. Capillary refill normal to all digits.   Neurologic Normal speech. Oriented to person, place, and time. Epicritic sensation to light touch grossly present bilaterally.  Dermatologic Skin healing well without signs of infection. Skin edges well coapted without signs of infection.  Orthopedic: Tenderness to palpation noted about the surgical site.   Radiographs: None Assessment:   1. Equinus contracture of right ankle   2. 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)    Plan:  Patient was evaluated and treated and all questions answered.  S/p foot surgery right -Progressing as expected  post-operatively. -XR: See above -WB Status: Weightbearing as tolerated in regular shoes -Sutures: Moved.  No signs of dehiscence noted no complication noted. -Medications: None -Foot redressed.  Right transmetatarsal wound site - Patient will benefit from 6 Betadine wet-to-dry dressing changes.  Partial weightbearing to the heel - Patient will benefit from wound care center order/referral was placed for the wound care center  No follow-ups on file.

## 2023-11-09 DIAGNOSIS — L97512 Non-pressure chronic ulcer of other part of right foot with fat layer exposed: Secondary | ICD-10-CM | POA: Diagnosis not present

## 2023-11-09 DIAGNOSIS — Z880 Allergy status to penicillin: Secondary | ICD-10-CM | POA: Diagnosis not present

## 2023-11-09 DIAGNOSIS — G43909 Migraine, unspecified, not intractable, without status migrainosus: Secondary | ICD-10-CM | POA: Diagnosis not present

## 2023-11-09 DIAGNOSIS — L089 Local infection of the skin and subcutaneous tissue, unspecified: Secondary | ICD-10-CM | POA: Diagnosis not present

## 2023-11-09 DIAGNOSIS — Z89421 Acquired absence of other right toe(s): Secondary | ICD-10-CM | POA: Diagnosis not present

## 2023-11-09 DIAGNOSIS — J45909 Unspecified asthma, uncomplicated: Secondary | ICD-10-CM | POA: Diagnosis not present

## 2023-11-09 DIAGNOSIS — G4730 Sleep apnea, unspecified: Secondary | ICD-10-CM | POA: Diagnosis not present

## 2023-11-09 DIAGNOSIS — Z981 Arthrodesis status: Secondary | ICD-10-CM | POA: Diagnosis not present

## 2023-11-09 DIAGNOSIS — Z79899 Other long term (current) drug therapy: Secondary | ICD-10-CM | POA: Diagnosis not present

## 2023-11-09 DIAGNOSIS — M216X1 Other acquired deformities of right foot: Secondary | ICD-10-CM | POA: Diagnosis not present

## 2023-11-09 DIAGNOSIS — I10 Essential (primary) hypertension: Secondary | ICD-10-CM | POA: Diagnosis not present

## 2023-11-09 DIAGNOSIS — G629 Polyneuropathy, unspecified: Secondary | ICD-10-CM | POA: Diagnosis not present

## 2023-11-09 DIAGNOSIS — K219 Gastro-esophageal reflux disease without esophagitis: Secondary | ICD-10-CM | POA: Diagnosis not present

## 2023-11-11 ENCOUNTER — Telehealth: Payer: Self-pay

## 2023-11-11 ENCOUNTER — Telehealth: Payer: Self-pay | Admitting: Podiatry

## 2023-11-11 DIAGNOSIS — M7989 Other specified soft tissue disorders: Secondary | ICD-10-CM | POA: Diagnosis not present

## 2023-11-11 DIAGNOSIS — R600 Localized edema: Secondary | ICD-10-CM | POA: Diagnosis not present

## 2023-11-11 DIAGNOSIS — L03115 Cellulitis of right lower limb: Secondary | ICD-10-CM | POA: Diagnosis not present

## 2023-11-11 DIAGNOSIS — L97512 Non-pressure chronic ulcer of other part of right foot with fat layer exposed: Secondary | ICD-10-CM | POA: Diagnosis not present

## 2023-11-11 DIAGNOSIS — Z89421 Acquired absence of other right toe(s): Secondary | ICD-10-CM | POA: Diagnosis not present

## 2023-11-11 NOTE — Telephone Encounter (Signed)
 Copied from CRM [id number]. Topic: Clinical - Lab/Test Results >> Nov 11, 2023  3:03 PM Tobias CROME wrote: Reason for CRM: Patient had lab results done with [facility] on  11/09/23, her potassium levels came back at 3.1.   Patient calling to advise Dr. Duanne and inquiring if medication should be called in.   Please assist patient further.

## 2023-11-11 NOTE — Telephone Encounter (Signed)
 Patient called in requesting supplies. She needs Kerlix ace bandage,saline,guaze,wound wash, bandage roll,and tape.

## 2023-11-11 NOTE — Telephone Encounter (Signed)
 Informed pt to call the office of who ordered the labs.

## 2023-11-16 ENCOUNTER — Telehealth: Payer: Self-pay | Admitting: Neurology

## 2023-11-16 NOTE — Telephone Encounter (Signed)
 Have these items been ordered?

## 2023-11-16 NOTE — Telephone Encounter (Signed)
 Pt called stating she is not able to make the appt and would like to know if her information can be pulled so that she can do a VV. Please advise.

## 2023-11-17 DIAGNOSIS — H524 Presbyopia: Secondary | ICD-10-CM | POA: Diagnosis not present

## 2023-11-17 DIAGNOSIS — H5213 Myopia, bilateral: Secondary | ICD-10-CM | POA: Diagnosis not present

## 2023-11-17 DIAGNOSIS — H52213 Irregular astigmatism, bilateral: Secondary | ICD-10-CM | POA: Diagnosis not present

## 2023-11-17 DIAGNOSIS — H2513 Age-related nuclear cataract, bilateral: Secondary | ICD-10-CM | POA: Diagnosis not present

## 2023-11-17 DIAGNOSIS — H25013 Cortical age-related cataract, bilateral: Secondary | ICD-10-CM | POA: Diagnosis not present

## 2023-11-17 NOTE — Telephone Encounter (Signed)
 Rescheduled appt and stated to bring machine and cord

## 2023-11-18 DIAGNOSIS — M25552 Pain in left hip: Secondary | ICD-10-CM | POA: Diagnosis not present

## 2023-11-18 DIAGNOSIS — M25561 Pain in right knee: Secondary | ICD-10-CM | POA: Diagnosis not present

## 2023-11-18 DIAGNOSIS — M25511 Pain in right shoulder: Secondary | ICD-10-CM | POA: Diagnosis not present

## 2023-11-18 DIAGNOSIS — M961 Postlaminectomy syndrome, not elsewhere classified: Secondary | ICD-10-CM | POA: Diagnosis not present

## 2023-11-21 ENCOUNTER — Encounter: Admitting: Neurology

## 2023-11-22 ENCOUNTER — Ambulatory Visit: Admitting: Podiatry

## 2023-11-23 DIAGNOSIS — Z79891 Long term (current) use of opiate analgesic: Secondary | ICD-10-CM | POA: Diagnosis not present

## 2023-11-23 DIAGNOSIS — M25559 Pain in unspecified hip: Secondary | ICD-10-CM | POA: Diagnosis not present

## 2023-11-23 DIAGNOSIS — G8929 Other chronic pain: Secondary | ICD-10-CM | POA: Diagnosis not present

## 2023-11-25 ENCOUNTER — Other Ambulatory Visit: Payer: Self-pay | Admitting: Family Medicine

## 2023-11-25 DIAGNOSIS — Z1231 Encounter for screening mammogram for malignant neoplasm of breast: Secondary | ICD-10-CM

## 2023-11-29 DIAGNOSIS — G6289 Other specified polyneuropathies: Secondary | ICD-10-CM | POA: Diagnosis not present

## 2023-11-29 DIAGNOSIS — Z89431 Acquired absence of right foot: Secondary | ICD-10-CM | POA: Diagnosis not present

## 2023-11-29 DIAGNOSIS — Z89421 Acquired absence of other right toe(s): Secondary | ICD-10-CM | POA: Diagnosis not present

## 2023-11-29 DIAGNOSIS — L97519 Non-pressure chronic ulcer of other part of right foot with unspecified severity: Secondary | ICD-10-CM | POA: Diagnosis not present

## 2023-11-29 DIAGNOSIS — I872 Venous insufficiency (chronic) (peripheral): Secondary | ICD-10-CM | POA: Diagnosis not present

## 2023-11-29 DIAGNOSIS — L97412 Non-pressure chronic ulcer of right heel and midfoot with fat layer exposed: Secondary | ICD-10-CM | POA: Diagnosis not present

## 2023-11-29 DIAGNOSIS — G629 Polyneuropathy, unspecified: Secondary | ICD-10-CM | POA: Diagnosis not present

## 2023-12-01 ENCOUNTER — Encounter: Payer: Self-pay | Admitting: Nurse Practitioner

## 2023-12-01 ENCOUNTER — Other Ambulatory Visit: Payer: Self-pay | Admitting: Family Medicine

## 2023-12-01 ENCOUNTER — Ambulatory Visit: Attending: Nurse Practitioner | Admitting: Nurse Practitioner

## 2023-12-01 VITALS — BP 118/79 | HR 84 | Ht 64.0 in | Wt 234.0 lb

## 2023-12-01 DIAGNOSIS — R002 Palpitations: Secondary | ICD-10-CM | POA: Diagnosis not present

## 2023-12-01 DIAGNOSIS — R072 Precordial pain: Secondary | ICD-10-CM

## 2023-12-01 DIAGNOSIS — R0609 Other forms of dyspnea: Secondary | ICD-10-CM | POA: Diagnosis not present

## 2023-12-01 DIAGNOSIS — I10 Essential (primary) hypertension: Secondary | ICD-10-CM

## 2023-12-01 MED ORDER — METOPROLOL TARTRATE 100 MG PO TABS: ORAL_TABLET | ORAL | 0 refills | Status: AC

## 2023-12-01 NOTE — Patient Instructions (Addendum)
 Medication Instructions:  No changes *If you need a refill on your cardiac medications before your next appointment, please call your pharmacy*  Lab Work: Your provider would like for you to have the following labs today: BMET  If you have labs (blood work) drawn today and your tests are completely normal, you will receive your results only by: MyChart Message (if you have MyChart) OR A paper copy in the mail If you have any lab test that is abnormal or we need to change your treatment, we will call you to review the results.  Testing/Procedures: Your physician has requested that you have an echocardiogram. Echocardiography is a painless test that uses sound waves to create images of your heart. It provides your doctor with information about the size and shape of your heart and how well your heart's chambers and valves are working.   You may receive an ultrasound enhancing agent through an IV if needed to better visualize your heart during the echo. This procedure takes approximately one hour.  There are no restrictions for this procedure.  This will take place at [address] [facility]) [address], [place] [address]  Please note: We ask at that you not bring children with you during ultrasound (echo/ vascular) testing. Due to room size and safety concerns, children are not allowed in the ultrasound rooms during exams. Our front office staff cannot provide observation of children in our lobby area while testing is being conducted. An adult accompanying a patient to their appointment will only be allowed in the ultrasound room at the discretion of the ultrasound technician under special circumstances. We apologize for any inconvenience.   Follow-Up: At [facility], you and your health needs are our priority.  As part of our continuing mission to provide you with exceptional heart care, our providers are all part of one team.  This team includes your primary Cardiologist  (physician) and Advanced Practice Providers or APPs (Physician Assistants and Nurse Practitioners) who all work together to provide you with the care you need, when you need it.  Your next appointment:   6 week(s)  Provider:   Lonni Meager, NP    We recommend signing up for the patient portal called MyChart.  Sign up information is provided on this After Visit Summary.  MyChart is used to connect with patients for Virtual Visits (Telemedicine).  Patients are able to view lab/test results, encounter notes, upcoming appointments, etc.  Non-urgent messages can be sent to your provider as well.   To learn more about what you can do with MyChart, go to [email or web address].   Other Instructions   Your cardiac CT will be scheduled at one of the below locations:   [facility] [address] [place], [place] [address] [phone]   If scheduled at [facility], please arrive to the Heart and Vascular Center 15 mins early for check-in and test prep.  There is spacious parking and easy access to the radiology department from the [facility] and Vascular entrance. Please enter here and check-in with the desk attendant.   Please follow these instructions carefully (unless otherwise directed):  An IV will be required for this test and Nitroglycerin  will be given.   On the Night Before the Test: Be sure to Drink plenty of water . Do not consume any caffeinated/decaffeinated beverages or chocolate 12 hours prior to your test. Do not take any antihistamines 12 hours prior to your test.  On the Day  of the Test: Drink plenty of water  until 1 hour prior to the test. Do not eat any food 1 hour prior to test. You may take your regular medications prior to the test.  Take metoprolol  (Lopressor ) two hours prior to test. Patients who wear a continuous glucose monitor MUST remove the device prior to scanning. FEMALES- please wear underwire-free  bra if available, avoid dresses & tight clothing       After the Test: Drink plenty of water . After receiving IV contrast, you may experience a mild flushed feeling. This is normal. On occasion, you may experience a mild rash up to 24 hours after the test. This is not dangerous. If this occurs, you can take Benadryl  25 mg, Zyrtec, Claritin , or Allegra and increase your fluid intake. (Patients taking Tikosyn should avoid Benadryl , and may take Zyrtec, Claritin , or Allegra) If you experience trouble breathing, this can be serious. If it is severe call 911 IMMEDIATELY. If it is mild, please call our office.  We will call to schedule your test 2-4 weeks out understanding that some insurance companies will need an authorization prior to the service being performed.   For more information and frequently asked questions, please visit our website : [email or web address]  For non-scheduling related questions, please contact the cardiac imaging nurse navigator should you have any questions/concerns: Cardiac Imaging Nurse Navigators Direct Office Dial: [phone]   For scheduling needs, including cancellations and rescheduling, please call [place], [phone].

## 2023-12-01 NOTE — Progress Notes (Signed)
 Office Visit    Patient Name: [name] [name] Date of Encounter: 12/01/2023  Primary Care Provider:  [name], MD Primary Cardiologist:  Dorn Lesches, MD    Chief Complaint    58 y.o. female with a history of chest pain, tachypalpitations, syncope, dyspnea on exertion, hypertension, prediabetes, bipolar disorder, anxiety, arthritis, and asthma, who presents for follow-up related to chest pain and dyspnea.  Past Medical History   Subjective   Past Medical History:  Diagnosis Date   Acute metabolic encephalopathy 06/08/2022   AMS (altered mental status) 05/22/2022   Anxiety    Arthritis    Phreesia 02/08/2020   Asthma    mild intermittent   Asthma    Phreesia 02/08/2020   Bipolar 1 disorder ([name])    ect treatments last treatment Sep 02 1011   Chest pain    a. 09/2016 MV: EF >65%.  No ischemia.  Fixed small, mild mid anterior perfusion defect-suspect soft tissue attenuation.  Low risk.   Depression    Phreesia 02/08/2020   Depression    Phreesia 07/10/2020   GERD (gastroesophageal reflux disease)    Headache    History of echocardiogram    a. 06/2022 Echo: EF 55-60%, no rwma, nl RV fxn, mildly dil LA. Triv MR.   Hypertension    Pre-diabetes    Seizures ([name])    Thinks it was from taking Tramadol   Septic shock ([name]) 05/22/2022   Shortness of breath 09/23/2016   Overview:   exertional   Sleep apnea    AHI 40 (2024)   Substance abuse ([name])    Phreesia 02/08/2020   Syncope 08/26/2019   Syncope and collapse    Past Surgical History:  Procedure Laterality Date   ABDOMINAL HYSTERECTOMY     AMPUTATION Right 02/19/2023   Procedure: AMPUTATION FOOT, revision and Incision and drainage;  Surgeon: Joya Stabs, DPM;  Location: [facility];  Service: Orthopedics/Podiatry;  Laterality: Right;   AMPUTATION TOE Left 02/02/2018   Procedure: AMPUTATION TOE Left 4th toe;  Surgeon: Gershon Donnice SAUNDERS, DPM;  Location: [facility] OR;  Service: Podiatry;  Laterality: Left;    APPENDECTOMY N/A    Phreesia 02/08/2020   BACK SURGERY  2018   ACDF   C5-6 & C6-7 by Dr. Royden Schneider   CARPAL TUNNEL RELEASE     x2   I & D EXTREMITY Right 05/22/2022   Procedure: IRRIGATION AND DEBRIDEMENT EXTREMITY;  Surgeon: Tobie Franky SQUIBB, DPM;  Location: [facility] ORS;  Service: Podiatry;  Laterality: Right;   INCISION AND DRAINAGE Right 05/24/2022   Procedure: RIGHT REVISION INCISION AND DRAINAGE/ WASHOUT WITH PRIMARY DELAYED CLOSURE;  Surgeon: Silva Juliene SAUNDERS, DPM;  Location: [facility] OR[facility];  Service: Podiatry;  Laterality: Right;   IRRIGATION AND DEBRIDEMENT ABSCESS Right 10/25/2020   Procedure: IRRIGATION AND DEBRIDEMENT ABSCESS OF FOOT AND APPLICATION OF GRAFT;  Surgeon: Gershon Donnice SAUNDERS, DPM;  Location: [facility];  Service: Podiatry;  Laterality: Right;   Laproscopic knee surgery Right    NECK SURGERY  03/15/2017   ACDF   by Dr. Schneider   PLANTAR FASCIA RELEASE     x2   REVERSE SHOULDER ARTHROPLASTY Right 04/14/2022   Procedure: REVERSE SHOULDER ARTHROPLASTY;  Surgeon: Cristy Bonner DASEN, MD;  Location: [facility];  Service: Orthopedics;  Laterality: Right;   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   Procedure: TRANSMETATARSAL AMPUTATION;  Surgeon: Gershon Donnice SAUNDERS, DPM;  Location: [facility];  Service: Podiatry;  Laterality: Right;   TRANSMETATARSAL AMPUTATION Right  05/27/2022   Procedure: REVISION TRANSMETATARSAL AMPUTATION WITH DELAYED PRIMARY CLOSURE;  Surgeon: Janit Thresa HERO, DPM;  Location: [facility];  Service: Podiatry;  Laterality: Right;    Allergies  Allergies  Allergen Reactions   Tetracyclines & Related Other (See Comments)    Syncope and put her in a coma   Tramadol Other (See Comments)    Seizures   Oxybutynin Other (See Comments)    Unknown reaction per Mom   Phenazopyridine  Other (See Comments)    Unknown reaction   Ciprofloxacin  Rash and Itching   Codeine Itching and Rash   Estradiol  Rash    Patches broke out the skin       History of Present Illness      58 y.o. y/o  female with a history of chest pain, tachypalpitations, syncope, dyspnea on exertion, hypertension, prediabetes, bipolar disorder, anxiety, arthritis, and asthma.  Notes indicate the patient has a long history of somewhat atypical chest pain.  Stress testing in June 2018 showed an EF of 67% with a small, fixed, mild mid anterior perfusion defect suspicious for soft tissue attenuation.  There was no ischemia.  In March 2024, she was admitted following a syncopal spell that occurred in our waiting room in [place].  Echocardiogram in March 2024 showing an EF of 55 to 60% without regional wall motion abnormalities, normal RV function, mildly dilated left atrium, and trivial MR.   Ms. Baka was last seen in cardiology clinic by Dr. Court  in November 2023 at which time she reported stable dyspnea on exertion.  Over the past 6 months, she has had progression of dyspnea on exertion.  She says she can usually walk about 30 to 50 feet before having to take a break.  Ambulation is very limited at this point in the setting of right lower extremity ulcerations for which she is followed by podiatry at [facility].  She has a cast in place and mostly gets around using a wheelchair.  In addition to progressive dyspnea exertion, over the past 2 to 3 months, she has been experiencing intermittent resting exertional substernal chest tightness occurring about twice a week, associated with dyspnea, lasting 5 to 10 minutes, and resolving spontaneously.  Symptoms do seem to be worse with exertion.  She has never taken anything for the symptoms and is unsure if there are any triggers.  She denies palpitations, PND, orthopnea, dizziness, syncope, edema, or early satiety. Objective   Home Medications    Current Outpatient Medications  Medication Sig Dispense Refill   busPIRone  (BUSPAR ) 15 MG tablet Take 15 mg by mouth 3 (three) times daily.   2   busPIRone  (BUSPAR ) 30 MG tablet Take 30 mg by mouth 2 (two) times daily.     Calcium   Carbonate-Vit D-Min (CALCIUM  1200 PO) Take 1,200 mg by mouth daily.      celecoxib  (CELEBREX ) 100 MG capsule Take 100 mg by mouth 2 (two) times daily as needed.     DULoxetine  (CYMBALTA ) 60 MG capsule TAKE 1 CAPSULE BY MOUTH ONCE DAILY 30 capsule 10   Erenumab -aooe (AIMOVIG ) 70 MG/ML SOAJ INJECT 1 ML SUBCUTANEOUSLY EVERY 30 DAYS 1 mL 10   escitalopram  (LEXAPRO ) 10 MG tablet Take 10 mg by mouth daily.     estradiol  (ESTRACE ) 1 MG tablet TAKE 1 TABLET BY MOUTH EVERY DAY AT BEDTIME 30 tablet 10   famotidine  (PEPCID ) 40 MG tablet TAKE 1 TABLET BY MOUTH EVERY DAY AT BEDTIME 30 tablet 10   ferrous sulfate  (FEROSUL) 325 (  65 FE) MG tablet Take 1 tablet (325 mg total) by mouth 2 (two) times daily with a meal. TAKE 1 TABLET BY MOUTH AT BREAKFAST AND AT BEDTIME 60 tablet 3   haloperidol  (HALDOL ) 5 MG tablet Take 5 mg by mouth at bedtime.     lamoTRIgine  (LAMICTAL ) 100 MG tablet Take 100 mg by mouth daily.     levocetirizine (XYZAL ) 5 MG tablet TAKE 1 TABLET BY MOUTH EVERY EVENING 90 tablet 3   methocarbamol  (ROBAXIN ) 500 MG tablet Take 500 mg by mouth every 8 (eight) hours as needed.     Multiple Vitamins-Minerals (MULTIVITAMIN WITH MINERALS) tablet Take 1 tablet by mouth daily.     oxyCODONE -acetaminophen  (PERCOCET) 10-325 MG tablet Take 1 tablet by mouth every 6 (six) hours as needed for pain. Take one tablet by mouth up to 4 times daily as needed. 30 tablet 0   pantoprazole  (PROTONIX ) 40 MG tablet TAKE 1 TABLET BY MOUTH TWICE DAILY 60 tablet 10   polyethylene glycol (MIRALAX  / GLYCOLAX ) 17 g packet Take 17 g by mouth daily.     prazosin  (MINIPRESS ) 5 MG capsule Take 5 mg by mouth at bedtime.     pregabalin  (LYRICA ) 100 MG capsule Take 1 capsule (100 mg total) by mouth 3 (three) times daily. 30 capsule 0   solifenacin  (VESICARE ) 10 MG tablet TAKE 1 TABLET BY MOUTH ONCE DAILY 30 tablet 10   sulfamethoxazole -trimethoprim  (BACTRIM  DS) 800-160 MG tablet Take 1 tablet by mouth 2 (two) times daily. 20 tablet 0    SUMAtriptan  (IMITREX ) 50 MG tablet Take 50 mg by mouth every 2 (two) hours as needed for migraine.     topiramate  (TOPAMAX ) 50 MG tablet TAKE 1 TABLET BY MOUTH TWICE DAILY 60 tablet 10   traZODone  (DESYREL ) 100 MG tablet Take 1 tablet (100 mg total) by mouth at bedtime. (Patient taking differently: Take 200 mg by mouth at bedtime. 2 tablets at bedtime.) 30 tablet 0   cephALEXin  (KEFLEX ) 500 MG capsule Take 1 capsule (500 mg total) by mouth 3 (three) times daily. (Patient not taking: Reported on 12/01/2023) 21 capsule 0   prazosin  (MINIPRESS ) 2 MG capsule Take 2 mg by mouth at bedtime. (Patient not taking: Reported on 12/01/2023)     No current facility-administered medications for this visit.     Physical Exam    VS:  BP 118/79 (BP Location: Left Arm, Patient Position: Sitting, Cuff Size: Normal)   Pulse 84   Ht 5' 4 (1.626 m)   Wt 234 lb (106.1 kg)   SpO2 96%   BMI 40.17 kg/m  , BMI Body mass index is 40.17 kg/m.          GEN: Well nourished, well developed, in no acute distress. HEENT: normal. Neck: Supple, no JVD, carotid bruits, or masses. Cardiac: RRR, no murmurs, rubs, or gallops. No clubbing, cyanosis, edema.  Radials 2+/left PT 2+.  Right lower leg is wrapped.  No obvious edema above cast. Respiratory:  Respirations regular and unlabored, clear to auscultation bilaterally. GI: Soft, nontender, nondistended, BS + x 4. MS: no deformity or atrophy. Skin: warm and dry, no rash. Neuro:  Strength and sensation are intact. Psych: Normal affect.  Accessory Clinical Findings    ECG personally reviewed by me today - EKG Interpretation Date/Time:  Thursday December 01 2023 13:46:56 EDT Ventricular Rate:  84 PR Interval:  184 QRS Duration:  90 QT Interval:  390 QTC Calculation: 460 R Axis:   3  Text Interpretation: Normal  sinus rhythm Normal ECG Confirmed by Vivienne Bruckner [phone]) on 12/01/2023 2:15:08 PM  - no acute changes.  Lab Results  Component Value Date   WBC 9.3  10/11/2023   HGB 12.7 10/11/2023   HCT 36.3 10/11/2023   MCV 91.2 10/11/2023   PLT 302 10/11/2023   Lab Results  Component Value Date   CREATININE 0.90 11/03/2023   BUN 17 11/03/2023   NA 139 11/03/2023   K 4.0 11/03/2023   CL 106 11/03/2023   CO2 24 11/03/2023   Lab Results  Component Value Date   ALT 14 10/11/2023   AST 29 10/11/2023   ALKPHOS 75 10/11/2023   BILITOT 0.4 10/11/2023   Lab Results  Component Value Date   CHOL 187 09/18/2021   HDL 67 09/18/2021   LDLCALC 84 09/18/2021   TRIG 271 (H) 09/18/2021   CHOLHDL 2.8 09/18/2021    Lab Results  Component Value Date   HGBA1C 5.4 07/18/2022   Lab Results  Component Value Date   TSH 5.489 (H) 07/06/2022       Assessment & Plan    1.  Precordial pain: Patient with prior history of chest pain and nonischemic stress test in 2018.  She has chronic dyspnea on exertion, over the past 6 months, she has had progressive dyspnea and over the past 2 to 3 months, intermittent resting exertional substernal chest tightness that radiates up into her throat and is associated with dyspnea.  Symptoms typically occur about twice a week and last 5 to 10 minutes before resolving spontaneously.  There were no alleviating factors.  ECG today is without acute ST or T changes.  We discussed options for evaluation.  I will follow-up a basic metabolic panel and plan on coronary CT angiogram.  Further recommendations pending imaging.  2.  Dyspnea on exertion: Patient with chronic dyspnea on exertion and more recently a 41-month history of progressive dyspnea on exertion after walking only 30 to 50 feet.  She admits to being sedentary in the setting of right lower extremity ulcerations and foot pain requiring casting.  She appears euvolemic on examination.  Heart rate and blood pressure stable.  Follow-up 2D echocardiogram.  3.  Primary hypertension: Blood pressure stable at 118/79.  She is on prazosin 5 mg nightly, though this does not appear to  be prescribed for an indication of hypertension.  4.  Tachypalpitations: Quiescent.  5.  Right lower extremity ulcerations: Followed by [facility]ry at [facility].  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  She receives wound care.  Most recent office visit August 12 notes no evidence of osteomyelitis on recent MRI.  There is concern that she may require right below the knee amputation in the future.  6.  Disposition: Follow-up echocardiogram and coronary CT angiogram.  Follow-up in clinic in 6 weeks or sooner if necessary.  Bruckner Vivienne, NP 12/01/2023, 2:15 PM

## 2023-12-02 ENCOUNTER — Ambulatory Visit: Payer: Self-pay | Admitting: Nurse Practitioner

## 2023-12-02 LAB — BASIC METABOLIC PANEL WITH GFR
BUN/Creatinine Ratio: 15 (ref 12–28)
BUN: 14 mg/dL (ref 8–27)
CO2: 18 mmol/L — ABNORMAL LOW (ref 20–29)
Calcium: 9.1 mg/dL (ref 8.7–10.3)
Chloride: 102 mmol/L (ref 96–106)
Creatinine, Ser: 0.91 mg/dL (ref 0.57–1.00)
Glucose: 83 mg/dL (ref 70–99)
Potassium: 3.9 mmol/L (ref 3.5–5.2)
Sodium: 136 mmol/L (ref 134–144)
eGFR: 72 mL/min/1.73 (ref >59)

## 2023-12-05 NOTE — Telephone Encounter (Signed)
 Requested Prescriptions  Pending Prescriptions Disp Refills   levocetirizine (XYZAL ) 5 MG tablet [Pharmacy Med Name: LEVOCETIRIZINE 5MG  TAB 5 Tablet] 90 tablet 0    Sig: TAKE 1 TABLET BY MOUTH EVERY EVENING     Ear, Nose, and Throat:  Antihistamines - levocetirizine dihydrochloride  Passed - 12/05/2023  3:41 PM      Passed - Cr in normal range and within 360 days    Creat  Date Value Ref Range Status  11/03/2023 0.90 0.50 - 1.05 mg/dL Final   Creatinine, Ser  Date Value Ref Range Status  12/01/2023 0.91 0.57 - 1.00 mg/dL Final   Creatinine, Urine  Date Value Ref Range Status  05/31/2022 203 mg/dL Final    Comment:    Performed at [facility], [address], [place], [place] [address]         Passed - eGFR is 10 or above and within 360 days    GFR, Est African American  Date Value Ref Range Status  06/27/2020 119 > OR = 60 mL/min/1.77m2 Final   GFR, Est Non African American  Date Value Ref Range Status  06/27/2020 102 > OR = 60 mL/min/1.23m2 Final   GFR, Estimated  Date Value Ref Range Status  10/11/2023 >60 >60 mL/min Final    Comment:    (NOTE) Calculated using the CKD-EPI Creatinine Equation (2021)    eGFR  Date Value Ref Range Status  12/01/2023 72 >59 mL/min/1.73 Final         Passed - Valid encounter within last 12 months    Recent Outpatient Visits           1 month ago Hypokalemia   [place] [facility] [name], MD   7 months ago Anemia, unspecified type   [facility] [name], MD   8 months ago Anemia, unspecified type   [place] [facility] [name], MD   9 months ago Other acute osteomyelitis of right foot [facility])   [place] [facility] [name], MD   12 months ago Anemia, unspecified type   [facility] [place], Butler DASEN, MD       Future Appointments             In 1 month Vivienne,  Lonni Ingle, NP [facility]

## 2023-12-06 ENCOUNTER — Encounter: Attending: Physician Assistant | Admitting: Physician Assistant

## 2023-12-06 DIAGNOSIS — I5042 Chronic combined systolic (congestive) and diastolic (congestive) heart failure: Secondary | ICD-10-CM | POA: Diagnosis not present

## 2023-12-06 DIAGNOSIS — R7303 Prediabetes: Secondary | ICD-10-CM | POA: Diagnosis not present

## 2023-12-06 DIAGNOSIS — L97512 Non-pressure chronic ulcer of other part of right foot with fat layer exposed: Secondary | ICD-10-CM | POA: Insufficient documentation

## 2023-12-06 DIAGNOSIS — G603 Idiopathic progressive neuropathy: Secondary | ICD-10-CM | POA: Insufficient documentation

## 2023-12-06 DIAGNOSIS — I130 Hypertensive heart and chronic kidney disease with heart failure and stage 1 through stage 4 chronic kidney disease, or unspecified chronic kidney disease: Secondary | ICD-10-CM | POA: Insufficient documentation

## 2023-12-06 DIAGNOSIS — N183 Chronic kidney disease, stage 3 unspecified: Secondary | ICD-10-CM | POA: Insufficient documentation

## 2023-12-08 DIAGNOSIS — I5042 Chronic combined systolic (congestive) and diastolic (congestive) heart failure: Secondary | ICD-10-CM | POA: Diagnosis not present

## 2023-12-08 DIAGNOSIS — S91209D Unspecified open wound of unspecified toe(s) with damage to nail, subsequent encounter: Secondary | ICD-10-CM | POA: Diagnosis not present

## 2023-12-13 ENCOUNTER — Encounter (HOSPITAL_COMMUNITY): Payer: Self-pay

## 2023-12-13 ENCOUNTER — Encounter: Admitting: Internal Medicine

## 2023-12-13 DIAGNOSIS — I130 Hypertensive heart and chronic kidney disease with heart failure and stage 1 through stage 4 chronic kidney disease, or unspecified chronic kidney disease: Secondary | ICD-10-CM | POA: Diagnosis not present

## 2023-12-13 DIAGNOSIS — R7303 Prediabetes: Secondary | ICD-10-CM | POA: Diagnosis not present

## 2023-12-13 DIAGNOSIS — I5042 Chronic combined systolic (congestive) and diastolic (congestive) heart failure: Secondary | ICD-10-CM | POA: Diagnosis not present

## 2023-12-13 DIAGNOSIS — L97512 Non-pressure chronic ulcer of other part of right foot with fat layer exposed: Secondary | ICD-10-CM | POA: Diagnosis not present

## 2023-12-13 DIAGNOSIS — N183 Chronic kidney disease, stage 3 unspecified: Secondary | ICD-10-CM | POA: Diagnosis not present

## 2023-12-13 DIAGNOSIS — G603 Idiopathic progressive neuropathy: Secondary | ICD-10-CM | POA: Diagnosis not present

## 2023-12-15 ENCOUNTER — Encounter: Admitting: Internal Medicine

## 2023-12-15 ENCOUNTER — Ambulatory Visit
Admission: RE | Admit: 2023-12-15 | Discharge: 2023-12-15 | Disposition: A | Discharge status: Home / Self Care | Source: Ambulatory Visit | Attending: Nurse Practitioner | Admitting: Nurse Practitioner

## 2023-12-15 DIAGNOSIS — R072 Precordial pain: Secondary | ICD-10-CM | POA: Insufficient documentation

## 2023-12-15 DIAGNOSIS — I5042 Chronic combined systolic (congestive) and diastolic (congestive) heart failure: Secondary | ICD-10-CM | POA: Diagnosis not present

## 2023-12-15 DIAGNOSIS — I130 Hypertensive heart and chronic kidney disease with heart failure and stage 1 through stage 4 chronic kidney disease, or unspecified chronic kidney disease: Secondary | ICD-10-CM | POA: Diagnosis not present

## 2023-12-15 DIAGNOSIS — G629 Polyneuropathy, unspecified: Secondary | ICD-10-CM | POA: Diagnosis not present

## 2023-12-15 DIAGNOSIS — L97512 Non-pressure chronic ulcer of other part of right foot with fat layer exposed: Secondary | ICD-10-CM | POA: Diagnosis not present

## 2023-12-15 DIAGNOSIS — R7303 Prediabetes: Secondary | ICD-10-CM | POA: Diagnosis not present

## 2023-12-15 DIAGNOSIS — G603 Idiopathic progressive neuropathy: Secondary | ICD-10-CM | POA: Diagnosis not present

## 2023-12-15 DIAGNOSIS — N183 Chronic kidney disease, stage 3 unspecified: Secondary | ICD-10-CM | POA: Diagnosis not present

## 2023-12-15 MED ORDER — DILTIAZEM HCL 25 MG/5ML IV SOLN: 10.0000 mg | INTRAVENOUS | Status: DC | PRN

## 2023-12-15 MED ORDER — IOHEXOL 350 MG/ML SOLN
100.0000 mL | Freq: Once | INTRAVENOUS | Status: AC | PRN
  Administered 2023-12-15: 100 mL via INTRAVENOUS

## 2023-12-15 MED ORDER — NITROGLYCERIN 0.4 MG SL SUBL
0.8000 mg | SUBLINGUAL_TABLET | Freq: Once | SUBLINGUAL | Status: AC
  Administered 2023-12-15: 0.8 mg via SUBLINGUAL
  Filled 2023-12-15: qty 25

## 2023-12-15 MED ORDER — METOPROLOL TARTRATE 5 MG/5ML IV SOLN: 10.0000 mg | INTRAVENOUS | Status: DC | PRN

## 2023-12-15 NOTE — Progress Notes (Signed)
 Patient tolerated CT well. Drank water after. Vital signs stable encourage to drink water throughout day.Reasons explained and verbalized understanding. Ambulated steady gait.

## 2023-12-19 DIAGNOSIS — M961 Postlaminectomy syndrome, not elsewhere classified: Secondary | ICD-10-CM | POA: Diagnosis not present

## 2023-12-19 DIAGNOSIS — M25511 Pain in right shoulder: Secondary | ICD-10-CM | POA: Diagnosis not present

## 2023-12-19 DIAGNOSIS — M25552 Pain in left hip: Secondary | ICD-10-CM | POA: Diagnosis not present

## 2023-12-19 DIAGNOSIS — M25561 Pain in right knee: Secondary | ICD-10-CM | POA: Diagnosis not present

## 2023-12-20 ENCOUNTER — Telehealth: Payer: Self-pay

## 2023-12-20 ENCOUNTER — Ambulatory Visit (INDEPENDENT_AMBULATORY_CARE_PROVIDER_SITE_OTHER): Admitting: Neurology

## 2023-12-20 VITALS — BP 118/70 | HR 74 | Ht 64.0 in | Wt 241.7 lb

## 2023-12-20 DIAGNOSIS — G4733 Obstructive sleep apnea (adult) (pediatric): Secondary | ICD-10-CM

## 2023-12-20 DIAGNOSIS — G4730 Sleep apnea, unspecified: Secondary | ICD-10-CM

## 2023-12-20 NOTE — Progress Notes (Signed)
 SABRA

## 2023-12-20 NOTE — Progress Notes (Signed)
 Patient: [name] [name] Date of Birth: 08/04/1965  Reason for Visit: Follow up History from: Patient Primary Neurologist: Buck  ASSESSMENT AND PLAN 58 y.o. year old female   1.  OSA on CPAP (DME is [facility], set up 08/24/2023, HST 06/22/2022 showed severe obstructive sleep apnea with a total AHI of 40.4/hour and O2 nadir of 75%. There was a milder central sleep apnea component)  Very good compliance.  Continue nightly usage minimum 4 hours.  Will send a message to DME to see if they can assist with allowing CPAP to transmit data electronically to our portal.  Her AHI is 12.8, leak 37.7.  Will request a mask refit, consider chinstrap.  Her current pressure is 4 to 20 cm water .  She is mostly staying around 9, maximum at 10.5.  Will change the parameters 4-15 cm water .  I sent myself a reminder to follow-up with her in 1 month to check on the data.  Otherwise we will follow-up in 6 months, if we are able to allow her data to transmit electronically can switch this to a MyChart visit as transportation has to bring her.  I do think her continued fatigue is multifactorial given her multiple comorbidities.  HISTORY OF PRESENT ILLNESS: Today 12/20/23 Saw Dr. Buck in Dec 2023 reporting snoring and excessive daytime somnolence.  Previously diagnosed with sleep apnea.  HST 06/22/2022 showed severe obstructive sleep apnea with a total AHI of 40.4/hour and O2 nadir of 75%. There was a milder central sleep apnea component. Mild to moderate snoring was detected. We have not seen her in nearly 1.5 years. DME is [facility]. Setup 08/24/23. 97% compliance, 4-20 cm water , leak 37.7, AHI 12.8. In 2024 she had a lot of illness, wasn't able to follow up at that time. Doing well on CPAP. Sleeps through the night. Still has daytime fatigue, but other health issues contributing. Here with her son, [name] [name]. Using nasal pillow mask. Sleeping with mouth open, mentions using a chin strap? Going to have right RKA, due to reoccurring  ulcers. Transportation brought her today. ESS 14.   HISTORY  03/23/22 Dr. Buck: I saw your patient, [name] [name], upon your kind request in this clinic today for evaluation.  The patient is unaccompanied today.  As you know, [name] [name] is a 58 year old female with an underlying complex medical history of hypertension, asthma, reflux disease, prediabetes, history of substance use disorder, arthritis, anxiety, depression, history of seizures (by chart review), and obesity, who reports snoring and excessive daytime sleepiness. She was previously diagnosed with sleep apnea.  She is no longer on PAP therapy.  She has not used the CPAP in about 4 years. I reviewed your office note from 02/01/2022.  Her Epworth sleepiness score is 18 out of 24, fatigue severity score is 63 out of 63.  She lives with her mom and mom has also custody over a 57-year-old child (patient's nephew's child).  Patient reports that her bedtime is between 9 and 10 and rise time around 6.  She has rare nocturia and rare headaches at night or in the mornings.  She drinks less than 1 caffeine  containing beverage per day, no alcohol , she is a non-smoker.  She does not work.  She has a strong family history of sleep apnea affecting her mom, her maternal grandmother, her brother and her father.  She has had some weight fluctuation.  Of note, she is only several medications including potentially sedating medications and several psychotropic medications including Lamictal , Topamax , Lyrica ,  Cymbalta , prazosin , oxycodone , Lexapro , BuSpar , Haldol , trazodone , Flexeril .  She is currently in a boot with her right foot as she has an ulcer. Of note, I had evaluated her for sleep apnea several years ago.  She has not been seen in this clinic in over 5 years.  She had a split-night sleep study on 03/01/2016 which showed a baseline AHI of 46/h, O2 nadir 84%.  She has had some weight gain in the interim.   REVIEW OF SYSTEMS: Out of a complete 14 system review of  symptoms, the patient complains only of the following symptoms, and all other reviewed systems are negative.  See HPI  ALLERGIES: Allergies  Allergen Reactions   Tetracyclines & Related Other (See Comments)    Syncope and put her in a coma   Tramadol Other (See Comments)    Seizures   Oxybutynin Other (See Comments)    Unknown reaction per Mom   Phenazopyridine  Other (See Comments)    Unknown reaction   Ciprofloxacin  Rash and Itching   Codeine Itching and Rash   Estradiol  Rash    Patches broke out the skin    HOME MEDICATIONS: Outpatient Medications Prior to Visit  Medication Sig Dispense Refill   busPIRone  (BUSPAR ) 30 MG tablet Take 30 mg by mouth 2 (two) times daily.     Calcium  Carbonate-Vit D-Min (CALCIUM  1200 PO) Take 1,200 mg by mouth daily.      celecoxib  (CELEBREX ) 100 MG capsule Take 100 mg by mouth 2 (two) times daily as needed.     DULoxetine  (CYMBALTA ) 60 MG capsule TAKE 1 CAPSULE BY MOUTH ONCE DAILY 30 capsule 10   escitalopram  (LEXAPRO ) 10 MG tablet Take 10 mg by mouth daily.     estradiol  (ESTRACE ) 1 MG tablet TAKE 1 TABLET BY MOUTH EVERY DAY AT BEDTIME 30 tablet 10   famotidine  (PEPCID ) 40 MG tablet TAKE 1 TABLET BY MOUTH EVERY DAY AT BEDTIME 30 tablet 10   ferrous sulfate  (FEROSUL) 325 (65 FE) MG tablet Take 1 tablet (325 mg total) by mouth 2 (two) times daily with a meal. TAKE 1 TABLET BY MOUTH AT BREAKFAST AND AT BEDTIME 60 tablet 3   haloperidol  (HALDOL ) 5 MG tablet Take 5 mg by mouth at bedtime.     lamoTRIgine  (LAMICTAL ) 100 MG tablet Take 100 mg by mouth daily.     levocetirizine (XYZAL ) 5 MG tablet TAKE 1 TABLET BY MOUTH EVERY EVENING 90 tablet 0   methocarbamol  (ROBAXIN ) 500 MG tablet Take 500 mg by mouth every 8 (eight) hours as needed.     metoprolol  tartrate (LOPRESSOR ) 100 MG tablet Take one tablet two hours prior to the test 1 tablet 0   Multiple Vitamins-Minerals (MULTIVITAMIN WITH MINERALS) tablet Take 1 tablet by mouth daily.      oxyCODONE -acetaminophen  (PERCOCET) 10-325 MG tablet Take 1 tablet by mouth every 6 (six) hours as needed for pain. Take one tablet by mouth up to 4 times daily as needed. 30 tablet 0   pantoprazole  (PROTONIX ) 40 MG tablet TAKE 1 TABLET BY MOUTH TWICE DAILY 60 tablet 10   polyethylene glycol (MIRALAX  / GLYCOLAX ) 17 g packet Take 17 g by mouth daily.     prazosin  (MINIPRESS ) 5 MG capsule Take 5 mg by mouth at bedtime.     pregabalin  (LYRICA ) 100 MG capsule Take 1 capsule (100 mg total) by mouth 3 (three) times daily. 30 capsule 0   solifenacin  (VESICARE ) 10 MG tablet TAKE 1 TABLET BY MOUTH ONCE DAILY 30 tablet  10   SUMAtriptan  (IMITREX ) 50 MG tablet Take 50 mg by mouth every 2 (two) hours as needed for migraine.     topiramate  (TOPAMAX ) 50 MG tablet TAKE 1 TABLET BY MOUTH TWICE DAILY 60 tablet 10   traZODone  (DESYREL ) 100 MG tablet Take 1 tablet (100 mg total) by mouth at bedtime. (Patient taking differently: Take 200 mg by mouth at bedtime. 2 tablets at bedtime.) 30 tablet 0   busPIRone  (BUSPAR ) 15 MG tablet Take 15 mg by mouth 3 (three) times daily.   2   cephALEXin  (KEFLEX ) 500 MG capsule Take 1 capsule (500 mg total) by mouth 3 (three) times daily. (Patient not taking: Reported on 12/01/2023) 21 capsule 0   Erenumab -aooe (AIMOVIG ) 70 MG/ML SOAJ INJECT 1 ML SUBCUTANEOUSLY EVERY 30 DAYS 1 mL 10   prazosin  (MINIPRESS ) 2 MG capsule Take 2 mg by mouth at bedtime. (Patient not taking: Reported on 12/01/2023)     sulfamethoxazole -trimethoprim  (BACTRIM  DS) 800-160 MG tablet Take 1 tablet by mouth 2 (two) times daily. 20 tablet 0   No facility-administered medications prior to visit.    PAST MEDICAL HISTORY: Past Medical History:  Diagnosis Date   Acute metabolic encephalopathy 06/08/2022   AMS (altered mental status) 05/22/2022   Anxiety    Arthritis    Phreesia 02/08/2020   Asthma    mild intermittent   Asthma    Phreesia 02/08/2020   Bipolar 1 disorder ([name])    ect treatments last treatment  Sep 02 1011   Chest pain    a. 09/2016 MV: EF >65%.  No ischemia.  Fixed small, mild mid anterior perfusion defect-suspect soft tissue attenuation.  Low risk.   Depression    Phreesia 02/08/2020   Depression    Phreesia 07/10/2020   GERD (gastroesophageal reflux disease)    Headache    History of echocardiogram    a. 06/2022 Echo: EF 55-60%, no rwma, nl RV fxn, mildly dil LA. Triv MR.   Hypertension    Pre-diabetes    Seizures ([name])    Thinks it was from taking Tramadol   Septic shock ([name]) 05/22/2022   Shortness of breath 09/23/2016   Overview:   exertional   Sleep apnea    AHI 40 (2024)   Substance abuse ([name])    Phreesia 02/08/2020   Syncope 08/26/2019   Syncope and collapse     PAST SURGICAL HISTORY: Past Surgical History:  Procedure Laterality Date   ABDOMINAL HYSTERECTOMY     AMPUTATION Right 02/19/2023   Procedure: AMPUTATION FOOT, revision and Incision and drainage;  Surgeon: Joya Stabs, DPM;  Location: [facility] OR;  Service: Orthopedics/Podiatry;  Laterality: Right;   AMPUTATION TOE Left 02/02/2018   Procedure: AMPUTATION TOE Left 4th toe;  Surgeon: Gershon Donnice SAUNDERS, DPM;  Location: [facility] OR;  Service: Podiatry;  Laterality: Left;   APPENDECTOMY N/A    Phreesia 02/08/2020   BACK SURGERY  2018   ACDF   C5-6 & C6-7 by Dr. Royden Schneider   CARPAL TUNNEL RELEASE     x2   I & D EXTREMITY Right 05/22/2022   Procedure: IRRIGATION AND DEBRIDEMENT EXTREMITY;  Surgeon: Tobie Franky SQUIBB, DPM;  Location: [facility] ORS;  Service: Podiatry;  Laterality: Right;   INCISION AND DRAINAGE Right 05/24/2022   Procedure: RIGHT REVISION INCISION AND DRAINAGE/ WASHOUT WITH PRIMARY DELAYED CLOSURE;  Surgeon: Silva Juliene SAUNDERS, DPM;  Location: [facility] OR[facility];  Service: Podiatry;  Laterality: Right;   IRRIGATION AND DEBRIDEMENT ABSCESS Right 10/25/2020   Procedure:  IRRIGATION AND DEBRIDEMENT ABSCESS OF FOOT AND APPLICATION OF GRAFT;  Surgeon: Gershon Donnice SAUNDERS, DPM;  Location: [facility] OR;  Service: Podiatry;  Laterality:  Right;   Laproscopic knee surgery Right    NECK SURGERY  03/15/2017   ACDF   by Dr. Gust   PLANTAR FASCIA RELEASE     x2   REVERSE SHOULDER ARTHROPLASTY Right 04/14/2022   Procedure: REVERSE SHOULDER ARTHROPLASTY;  Surgeon: Cristy Bonner DASEN, MD;  Location: [facility];  Service: Orthopedics;  Laterality: Right;   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   Procedure: TRANSMETATARSAL AMPUTATION;  Surgeon: Gershon Donnice SAUNDERS, DPM;  Location: [facility];  Service: Podiatry;  Laterality: Right;   TRANSMETATARSAL AMPUTATION Right 05/27/2022   Procedure: REVISION TRANSMETATARSAL AMPUTATION WITH DELAYED PRIMARY CLOSURE;  Surgeon: Janit Thresa HERO, DPM;  Location: [facility];  Service: Podiatry;  Laterality: Right;    FAMILY HISTORY: Family History  Problem Relation Age of Onset   Diabetes Mother    COPD Mother    Hypertension Mother    Hyperlipidemia Mother    Sleep apnea Mother    Heart disease Father    Hyperlipidemia Father    Hypertension Father    Cancer Father    Sleep apnea Father    Heart disease Brother    Sleep apnea Brother    Heart disease Maternal Grandmother    Hypertension Maternal Grandmother    Sleep apnea Maternal Grandmother    Heart disease Maternal Grandfather    Kidney cancer Paternal Grandmother    Heart disease Paternal Grandfather    Heart disease Daughter     SOCIAL HISTORY: Social History   Socioeconomic History   Marital status: Widowed    Spouse name: Not on file   Number of children: 3   Years of education: 7   Highest education level: Not on file  Occupational History   Occupation: Disabled  Tobacco Use   Smoking status: Never   Smokeless tobacco: Never  Vaping Use   Vaping status: Never Used  Substance and Sexual Activity   Alcohol  use: No   Drug use: No   Sexual activity: Not Currently  Other Topics Concern   Not on file  Social History Narrative   Patient drink 4 16oz caffeine  drinks a day    Social Drivers of Health   Financial Resource  Strain: Low Risk  (07/27/2022)   Overall Financial Resource Strain (CARDIA)    Difficulty of Paying Living Expenses: Not hard at all  Food Insecurity: No Food Insecurity (02/18/2023)   Hunger Vital Sign    Worried About Running Out of Food in the Last Year: Never true    Ran Out of Food in the Last Year: Never true  Transportation Needs: No Transportation Needs (02/18/2023)   PRAPARE - Administrator, Civil Service (Medical): No    Lack of Transportation (Non-Medical): No  Physical Activity: Inactive (07/26/2022)   Exercise Vital Sign    Days of Exercise per Week: 0 days    Minutes of Exercise per Session: 0 min  Stress: No Stress Concern Present (11/19/2022)   [employer] of Occupational Health - Occupational Stress Questionnaire    Feeling of Stress : Not at all  Social Connections: Unknown (08/30/2021)   Received from [facility]   Social Network    Social Network: Not on file  Intimate Partner Violence: Not At Risk (09/27/2023)   Received from [facility]   Humiliation, Afraid, Rape, and Kick questionnaire    Within  the last year, have you been afraid of your partner or ex-partner?: No    Within the last year, have you been humiliated or emotionally abused in other ways by your partner or ex-partner?: No    Within the last year, have you been kicked, hit, slapped, or otherwise physically hurt by your partner or ex-partner?: No    Within the last year, have you been raped or forced to have any kind of sexual activity by your partner or ex-partner?: No    PHYSICAL EXAM  Vitals:   12/20/23 1333  BP: 118/70  Pulse: 74  SpO2: 97%  Weight: 241 lb 10.9 oz (109.6 kg)  Height: 5' 4 (1.626 m)   Body mass index is 41.48 kg/m.  Generalized: Well developed, in no acute distress  Neurological examination  Mentation: Alert oriented to time, place, history taking. Follows all commands speech and language fluent Cranial nerve II-XII: Pupils were equal round reactive to  light. Extraocular movements were full, visual field were full on confrontational test. Facial sensation and strength were normal. Head turning and shoulder shrug  were normal and symmetric. Motor: Moves all extremities, right foot is wrapped in a boot Gait and station: Seated in a wheelchair  DIAGNOSTIC DATA (LABS, IMAGING, TESTING) - I reviewed patient records, labs, notes, testing and imaging myself where available.  Lab Results  Component Value Date   WBC 9.3 10/11/2023   HGB 12.7 10/11/2023   HCT 36.3 10/11/2023   MCV 91.2 10/11/2023   PLT 302 10/11/2023      Component Value Date/Time   NA 136 12/01/2023 1445   K 3.9 12/01/2023 1445   CL 102 12/01/2023 1445   CO2 18 (L) 12/01/2023 1445   GLUCOSE 83 12/01/2023 1445   GLUCOSE 94 11/03/2023 1215   BUN 14 12/01/2023 1445   CREATININE 0.91 12/01/2023 1445   CREATININE 0.90 11/03/2023 1215   CALCIUM  9.1 12/01/2023 1445   PROT 7.3 10/11/2023 0048   ALBUMIN 3.7 10/11/2023 0048   AST 29 10/11/2023 0048   ALT 14 10/11/2023 0048   ALKPHOS 75 10/11/2023 0048   BILITOT 0.4 10/11/2023 0048   GFRNONAA >60 10/11/2023 0048   GFRNONAA 102 06/27/2020 1457   GFRAA 119 06/27/2020 1457   Lab Results  Component Value Date   CHOL 187 09/18/2021   HDL 67 09/18/2021   LDLCALC 84 09/18/2021   TRIG 271 (H) 09/18/2021   CHOLHDL 2.8 09/18/2021   Lab Results  Component Value Date   HGBA1C 5.4 07/18/2022   Lab Results  Component Value Date   VITAMINB12 512 10/08/2022   Lab Results  Component Value Date   TSH 5.489 (H) 07/06/2022   Lauraine Born, AGNP-C, DNP 12/20/2023, 2:08 PM [facility] [address], [address] [place], [place] [address] [phone]

## 2023-12-20 NOTE — Telephone Encounter (Signed)
-----   Message from Lauraine JINNY Born sent at 12/20/2023  2:16 PM EDT ----- Please reach out to DME [facility] about allowing for electronic data retrieval.  I ordered mask refit and pressure change.  Thanks

## 2023-12-20 NOTE — Telephone Encounter (Signed)
 Cpap order and message to clarify that we need pts machine to link to resmed sent to [facility] via community msg thru epic:

## 2023-12-20 NOTE — Patient Instructions (Signed)
 Great to meet you today! Continue nightly CPAP use minimum 4 hours Mask refit with chinstrap Reduce pressure to 4-15 cm water   Will reach out to DME to see if can transmit electronically  Follow-up in 6 months.  Thanks!!

## 2023-12-21 DIAGNOSIS — M25559 Pain in unspecified hip: Secondary | ICD-10-CM | POA: Diagnosis not present

## 2023-12-21 DIAGNOSIS — G8929 Other chronic pain: Secondary | ICD-10-CM | POA: Diagnosis not present

## 2023-12-21 DIAGNOSIS — Z79891 Long term (current) use of opiate analgesic: Secondary | ICD-10-CM | POA: Diagnosis not present

## 2023-12-22 ENCOUNTER — Encounter: Attending: Physician Assistant | Admitting: Physician Assistant

## 2023-12-22 DIAGNOSIS — L97512 Non-pressure chronic ulcer of other part of right foot with fat layer exposed: Secondary | ICD-10-CM | POA: Insufficient documentation

## 2023-12-22 DIAGNOSIS — R7303 Prediabetes: Secondary | ICD-10-CM | POA: Diagnosis not present

## 2023-12-22 DIAGNOSIS — G603 Idiopathic progressive neuropathy: Secondary | ICD-10-CM | POA: Diagnosis not present

## 2023-12-22 DIAGNOSIS — S81812A Laceration without foreign body, left lower leg, initial encounter: Secondary | ICD-10-CM | POA: Diagnosis not present

## 2023-12-22 DIAGNOSIS — N183 Chronic kidney disease, stage 3 unspecified: Secondary | ICD-10-CM | POA: Diagnosis not present

## 2023-12-22 DIAGNOSIS — I129 Hypertensive chronic kidney disease with stage 1 through stage 4 chronic kidney disease, or unspecified chronic kidney disease: Secondary | ICD-10-CM | POA: Insufficient documentation

## 2023-12-22 DIAGNOSIS — I5042 Chronic combined systolic (congestive) and diastolic (congestive) heart failure: Secondary | ICD-10-CM | POA: Diagnosis not present

## 2023-12-30 ENCOUNTER — Encounter: Admitting: Physician Assistant

## 2023-12-30 DIAGNOSIS — G603 Idiopathic progressive neuropathy: Secondary | ICD-10-CM | POA: Diagnosis not present

## 2023-12-30 DIAGNOSIS — N183 Chronic kidney disease, stage 3 unspecified: Secondary | ICD-10-CM | POA: Diagnosis not present

## 2023-12-30 DIAGNOSIS — L97512 Non-pressure chronic ulcer of other part of right foot with fat layer exposed: Secondary | ICD-10-CM | POA: Diagnosis not present

## 2023-12-30 DIAGNOSIS — S81802A Unspecified open wound, left lower leg, initial encounter: Secondary | ICD-10-CM | POA: Diagnosis not present

## 2023-12-30 DIAGNOSIS — I129 Hypertensive chronic kidney disease with stage 1 through stage 4 chronic kidney disease, or unspecified chronic kidney disease: Secondary | ICD-10-CM | POA: Diagnosis not present

## 2023-12-30 DIAGNOSIS — I5042 Chronic combined systolic (congestive) and diastolic (congestive) heart failure: Secondary | ICD-10-CM | POA: Diagnosis not present

## 2023-12-30 DIAGNOSIS — R7303 Prediabetes: Secondary | ICD-10-CM | POA: Diagnosis not present

## 2024-01-02 DIAGNOSIS — I5042 Chronic combined systolic (congestive) and diastolic (congestive) heart failure: Secondary | ICD-10-CM | POA: Diagnosis not present

## 2024-01-03 ENCOUNTER — Encounter: Admitting: Podiatry

## 2024-01-05 ENCOUNTER — Encounter: Admitting: Podiatry

## 2024-01-05 ENCOUNTER — Encounter: Admitting: Physician Assistant

## 2024-01-05 DIAGNOSIS — G603 Idiopathic progressive neuropathy: Secondary | ICD-10-CM | POA: Diagnosis not present

## 2024-01-05 DIAGNOSIS — N183 Chronic kidney disease, stage 3 unspecified: Secondary | ICD-10-CM | POA: Diagnosis not present

## 2024-01-05 DIAGNOSIS — L97512 Non-pressure chronic ulcer of other part of right foot with fat layer exposed: Secondary | ICD-10-CM | POA: Diagnosis not present

## 2024-01-05 DIAGNOSIS — I129 Hypertensive chronic kidney disease with stage 1 through stage 4 chronic kidney disease, or unspecified chronic kidney disease: Secondary | ICD-10-CM | POA: Diagnosis not present

## 2024-01-05 DIAGNOSIS — R7303 Prediabetes: Secondary | ICD-10-CM | POA: Diagnosis not present

## 2024-01-05 DIAGNOSIS — I5042 Chronic combined systolic (congestive) and diastolic (congestive) heart failure: Secondary | ICD-10-CM | POA: Diagnosis not present

## 2024-01-12 ENCOUNTER — Ambulatory Visit: Attending: Nurse Practitioner

## 2024-01-12 DIAGNOSIS — R0609 Other forms of dyspnea: Secondary | ICD-10-CM

## 2024-01-12 LAB — ECHOCARDIOGRAM COMPLETE
AR max vel: 2.6 cm2
AV Area VTI: 2.77 cm2
AV Area mean vel: 2.53 cm2
AV Mean grad: 5 mmHg
AV Peak grad: 8.4 mmHg
Ao pk vel: 1.45 m/s
Area-P 1/2: 4.21 cm2
S' Lateral: 3.26 cm

## 2024-01-13 ENCOUNTER — Encounter: Admitting: Physician Assistant

## 2024-01-13 DIAGNOSIS — S81812D Laceration without foreign body, left lower leg, subsequent encounter: Secondary | ICD-10-CM | POA: Diagnosis not present

## 2024-01-13 DIAGNOSIS — R7303 Prediabetes: Secondary | ICD-10-CM | POA: Diagnosis not present

## 2024-01-13 DIAGNOSIS — I129 Hypertensive chronic kidney disease with stage 1 through stage 4 chronic kidney disease, or unspecified chronic kidney disease: Secondary | ICD-10-CM | POA: Diagnosis not present

## 2024-01-13 DIAGNOSIS — L97512 Non-pressure chronic ulcer of other part of right foot with fat layer exposed: Secondary | ICD-10-CM | POA: Diagnosis not present

## 2024-01-13 DIAGNOSIS — G603 Idiopathic progressive neuropathy: Secondary | ICD-10-CM | POA: Diagnosis not present

## 2024-01-13 DIAGNOSIS — I5042 Chronic combined systolic (congestive) and diastolic (congestive) heart failure: Secondary | ICD-10-CM | POA: Diagnosis not present

## 2024-01-13 DIAGNOSIS — N183 Chronic kidney disease, stage 3 unspecified: Secondary | ICD-10-CM | POA: Diagnosis not present

## 2024-01-18 DIAGNOSIS — M25552 Pain in left hip: Secondary | ICD-10-CM | POA: Diagnosis not present

## 2024-01-18 DIAGNOSIS — M25511 Pain in right shoulder: Secondary | ICD-10-CM | POA: Diagnosis not present

## 2024-01-18 DIAGNOSIS — M25561 Pain in right knee: Secondary | ICD-10-CM | POA: Diagnosis not present

## 2024-01-18 DIAGNOSIS — M961 Postlaminectomy syndrome, not elsewhere classified: Secondary | ICD-10-CM | POA: Diagnosis not present

## 2024-01-19 ENCOUNTER — Ambulatory Visit: Admitting: Physician Assistant

## 2024-01-20 ENCOUNTER — Ambulatory Visit: Admitting: Nurse Practitioner

## 2024-01-23 ENCOUNTER — Encounter: Attending: Physician Assistant | Admitting: Physician Assistant

## 2024-01-23 DIAGNOSIS — I5042 Chronic combined systolic (congestive) and diastolic (congestive) heart failure: Secondary | ICD-10-CM | POA: Diagnosis not present

## 2024-01-23 DIAGNOSIS — G603 Idiopathic progressive neuropathy: Secondary | ICD-10-CM | POA: Diagnosis not present

## 2024-01-23 DIAGNOSIS — R7303 Prediabetes: Secondary | ICD-10-CM | POA: Insufficient documentation

## 2024-01-23 DIAGNOSIS — I130 Hypertensive heart and chronic kidney disease with heart failure and stage 1 through stage 4 chronic kidney disease, or unspecified chronic kidney disease: Secondary | ICD-10-CM | POA: Diagnosis not present

## 2024-01-23 DIAGNOSIS — N183 Chronic kidney disease, stage 3 unspecified: Secondary | ICD-10-CM | POA: Diagnosis not present

## 2024-01-23 DIAGNOSIS — L97512 Non-pressure chronic ulcer of other part of right foot with fat layer exposed: Secondary | ICD-10-CM | POA: Diagnosis not present

## 2024-01-24 ENCOUNTER — Encounter: Payer: Self-pay | Admitting: Neurology

## 2024-01-24 DIAGNOSIS — S81802A Unspecified open wound, left lower leg, initial encounter: Secondary | ICD-10-CM | POA: Diagnosis not present

## 2024-01-24 DIAGNOSIS — I5042 Chronic combined systolic (congestive) and diastolic (congestive) heart failure: Secondary | ICD-10-CM | POA: Diagnosis not present

## 2024-01-24 DIAGNOSIS — G4733 Obstructive sleep apnea (adult) (pediatric): Secondary | ICD-10-CM | POA: Diagnosis not present

## 2024-01-25 DIAGNOSIS — I70219 Atherosclerosis of native arteries of extremities with intermittent claudication, unspecified extremity: Secondary | ICD-10-CM | POA: Diagnosis not present

## 2024-01-25 DIAGNOSIS — T8789 Other complications of amputation stump: Secondary | ICD-10-CM | POA: Diagnosis not present

## 2024-01-25 DIAGNOSIS — Z89431 Acquired absence of right foot: Secondary | ICD-10-CM | POA: Diagnosis not present

## 2024-01-25 DIAGNOSIS — M79661 Pain in right lower leg: Secondary | ICD-10-CM | POA: Diagnosis not present

## 2024-01-25 DIAGNOSIS — L97519 Non-pressure chronic ulcer of other part of right foot with unspecified severity: Secondary | ICD-10-CM | POA: Diagnosis not present

## 2024-01-25 DIAGNOSIS — L97509 Non-pressure chronic ulcer of other part of unspecified foot with unspecified severity: Secondary | ICD-10-CM | POA: Diagnosis not present

## 2024-02-01 DIAGNOSIS — Z79891 Long term (current) use of opiate analgesic: Secondary | ICD-10-CM | POA: Diagnosis not present

## 2024-02-01 DIAGNOSIS — G8929 Other chronic pain: Secondary | ICD-10-CM | POA: Diagnosis not present

## 2024-02-01 DIAGNOSIS — M25559 Pain in unspecified hip: Secondary | ICD-10-CM | POA: Diagnosis not present

## 2024-02-06 ENCOUNTER — Ambulatory Visit: Admitting: Physician Assistant

## 2024-02-13 ENCOUNTER — Encounter: Admitting: Physician Assistant

## 2024-02-20 ENCOUNTER — Encounter: Attending: Physician Assistant | Admitting: Physician Assistant

## 2024-02-29 ENCOUNTER — Other Ambulatory Visit: Payer: Self-pay | Admitting: Family Medicine

## 2024-03-01 ENCOUNTER — Encounter: Payer: Self-pay | Admitting: Family Medicine

## 2024-03-01 ENCOUNTER — Ambulatory Visit (INDEPENDENT_AMBULATORY_CARE_PROVIDER_SITE_OTHER): Admitting: Family Medicine

## 2024-03-01 VITALS — BP 136/76 | HR 71 | Temp 97.7°F | Ht 64.0 in | Wt 240.0 lb

## 2024-03-01 DIAGNOSIS — Z1211 Encounter for screening for malignant neoplasm of colon: Secondary | ICD-10-CM | POA: Diagnosis not present

## 2024-03-01 DIAGNOSIS — Z01818 Encounter for other preprocedural examination: Secondary | ICD-10-CM | POA: Diagnosis not present

## 2024-03-01 DIAGNOSIS — I10 Essential (primary) hypertension: Secondary | ICD-10-CM

## 2024-03-01 DIAGNOSIS — Z23 Encounter for immunization: Secondary | ICD-10-CM

## 2024-03-01 NOTE — Progress Notes (Signed)
 Subjective:    Patient ID: [name] [name], female    DOB: 18-Apr-1966, 58 y.o.   MRN: [id number]  Patient is here today for a preoperative evaluation.  She is going to [facility] in 1 week to have her right leg removed below her knee.  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  They have been unable to get the wounds to heal and have recommended amputation.  The patient denies any chest pain.  She had a coronary artery CT scan earlier this year that showed no coronary artery disease.  She denies any angina or shortness of breath or chest pain.  She denies any fevers or chills. Past Medical History:  Diagnosis Date   Acute metabolic encephalopathy 06/08/2022   AMS (altered mental status) 05/22/2022   Anxiety    Arthritis    Phreesia 02/08/2020   Asthma    mild intermittent   Asthma    Phreesia 02/08/2020   Bipolar 1 disorder ([name])    ect treatments last treatment Sep 02 1011   Chest pain    a. 09/2016 MV: EF >65%.  No ischemia.  Fixed small, mild mid anterior perfusion defect-suspect soft tissue attenuation.  Low risk.   Depression    Phreesia 02/08/2020   Depression    Phreesia 07/10/2020   GERD (gastroesophageal reflux disease)    Headache    History of echocardiogram    a. 06/2022 Echo: EF 55-60%, no rwma, nl RV fxn, mildly dil LA. Triv MR.   Hypertension    Pre-diabetes    Seizures ([name])    Thinks it was from taking Tramadol   Septic shock ([name]) 05/22/2022   Shortness of breath 09/23/2016   Overview:   exertional   Sleep apnea    AHI 40 (2024)   Substance abuse ([name])    Phreesia 02/08/2020   Syncope 08/26/2019   Syncope and collapse    Past Surgical History:  Procedure Laterality Date   ABDOMINAL HYSTERECTOMY     AMPUTATION Right 02/19/2023   Procedure: AMPUTATION FOOT, revision and Incision and drainage;  Surgeon: Joya Stabs, DPM;  Location: [facility] OR;  Service: Orthopedics/Podiatry;  Laterality: Right;   AMPUTATION TOE Left  02/02/2018   Procedure: AMPUTATION TOE Left 4th toe;  Surgeon: Gershon Donnice SAUNDERS, DPM;  Location: [facility] OR;  Service: Podiatry;  Laterality: Left;   APPENDECTOMY N/A    Phreesia 02/08/2020   BACK SURGERY  2018   ACDF   C5-6 & C6-7 by Dr. Royden Schneider   CARPAL TUNNEL RELEASE     x2   I & D EXTREMITY Right 05/22/2022   Procedure: IRRIGATION AND DEBRIDEMENT EXTREMITY;  Surgeon: Tobie Franky SQUIBB, DPM;  Location: [facility] ORS;  Service: Podiatry;  Laterality: Right;   INCISION AND DRAINAGE Right 05/24/2022   Procedure: RIGHT REVISION INCISION AND DRAINAGE/ WASHOUT WITH PRIMARY DELAYED CLOSURE;  Surgeon: Silva Juliene SAUNDERS, DPM;  Location: [facility] OR[facility];  Service: Podiatry;  Laterality: Right;   IRRIGATION AND DEBRIDEMENT ABSCESS Right 10/25/2020   Procedure: IRRIGATION AND DEBRIDEMENT ABSCESS OF FOOT AND APPLICATION OF GRAFT;  Surgeon: Gershon Donnice SAUNDERS, DPM;  Location: [facility] OR;  Service: Podiatry;  Laterality: Right;   Laproscopic knee surgery Right    NECK SURGERY  03/15/2017   ACDF   by Dr. Schneider FESTER FASCIA RELEASE     x2   REVERSE SHOULDER ARTHROPLASTY Right 04/14/2022   Procedure: REVERSE SHOULDER ARTHROPLASTY;  Surgeon: Varkey, Dax  T, MD;  Location: [facility];  Service: Orthopedics;  Laterality: Right;   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   Procedure: TRANSMETATARSAL AMPUTATION;  Surgeon: Gershon Donnice SAUNDERS, DPM;  Location: [facility];  Service: Podiatry;  Laterality: Right;   TRANSMETATARSAL AMPUTATION Right 05/27/2022   Procedure: REVISION TRANSMETATARSAL AMPUTATION WITH DELAYED PRIMARY CLOSURE;  Surgeon: Janit Thresa HERO, DPM;  Location: [facility];  Service: Podiatry;  Laterality: Right;   Current Outpatient Medications on File Prior to Visit  Medication Sig Dispense Refill   OXcarbazepine (TRILEPTAL) 150 MG tablet Take 150 mg by mouth daily.     busPIRone  (BUSPAR ) 30 MG tablet Take 30 mg by mouth 2 (two) times daily.     Calcium  Carbonate-Vit D-Min (CALCIUM  1200 PO) Take 1,200 mg by mouth daily.       celecoxib  (CELEBREX ) 100 MG capsule Take 100 mg by mouth 2 (two) times daily as needed.     DULoxetine  (CYMBALTA ) 60 MG capsule TAKE 1 CAPSULE BY MOUTH ONCE DAILY 30 capsule 10   escitalopram  (LEXAPRO ) 10 MG tablet Take 10 mg by mouth daily.     estradiol  (ESTRACE ) 1 MG tablet TAKE 1 TABLET BY MOUTH EVERY DAY AT BEDTIME 30 tablet 10   famotidine  (PEPCID ) 40 MG tablet TAKE 1 TABLET BY MOUTH EVERY DAY AT BEDTIME 30 tablet 10   ferrous sulfate  (FEROSUL) 325 (65 FE) MG tablet Take 1 tablet (325 mg total) by mouth 2 (two) times daily with a meal. TAKE 1 TABLET BY MOUTH AT BREAKFAST AND AT BEDTIME 60 tablet 3   haloperidol  (HALDOL ) 5 MG tablet Take 5 mg by mouth at bedtime.     lamoTRIgine  (LAMICTAL ) 100 MG tablet Take 100 mg by mouth daily. (Patient not taking: Reported on 03/01/2024)     levocetirizine (XYZAL ) 5 MG tablet TAKE 1 TABLET BY MOUTH EVERY EVENING 90 tablet 0   methocarbamol  (ROBAXIN ) 500 MG tablet Take 500 mg by mouth every 8 (eight) hours as needed.     metoprolol  tartrate (LOPRESSOR ) 100 MG tablet Take one tablet two hours prior to the test (Patient not taking: Reported on 03/01/2024) 1 tablet 0   Multiple Vitamins-Minerals (MULTIVITAMIN WITH MINERALS) tablet Take 1 tablet by mouth daily.     oxyCODONE -acetaminophen  (PERCOCET) 10-325 MG tablet Take 1 tablet by mouth every 6 (six) hours as needed for pain. Take one tablet by mouth up to 4 times daily as needed. 30 tablet 0   pantoprazole  (PROTONIX ) 40 MG tablet TAKE 1 TABLET BY MOUTH TWICE DAILY 60 tablet 10   polyethylene glycol (MIRALAX  / GLYCOLAX ) 17 g packet Take 17 g by mouth daily.     prazosin  (MINIPRESS ) 5 MG capsule Take 5 mg by mouth at bedtime.     pregabalin  (LYRICA ) 100 MG capsule Take 1 capsule (100 mg total) by mouth 3 (three) times daily. 30 capsule 0   solifenacin  (VESICARE ) 10 MG tablet TAKE 1 TABLET BY MOUTH ONCE DAILY 30 tablet 10   SUMAtriptan  (IMITREX ) 50 MG tablet Take 50 mg by mouth every 2 (two) hours as needed for  migraine.     topiramate  (TOPAMAX ) 50 MG tablet TAKE 1 TABLET BY MOUTH TWICE DAILY 60 tablet 10   traZODone  (DESYREL ) 100 MG tablet Take 1 tablet (100 mg total) by mouth at bedtime. (Patient taking differently: Take 200 mg by mouth at bedtime. 2 tablets at bedtime.) 30 tablet 0   No current facility-administered medications on file prior to visit.   Allergies  Allergen Reactions   Tetracyclines &  Related Other (See Comments)    Syncope and put her in a coma   Tramadol Other (See Comments)    Seizures   Oxybutynin Other (See Comments)    Unknown reaction per Mom   Phenazopyridine  Other (See Comments)    Unknown reaction   Ciprofloxacin  Rash and Itching   Codeine Itching and Rash   Estradiol  Rash    Patches broke out the skin   Social History   Socioeconomic History   Marital status: Widowed    Spouse name: Not on file   Number of children: 3   Years of education: 36   Highest education level: Not on file  Occupational History   Occupation: Disabled  Tobacco Use   Smoking status: Never   Smokeless tobacco: Never  Vaping Use   Vaping status: Never Used  Substance and Sexual Activity   Alcohol  use: No   Drug use: No   Sexual activity: Not Currently  Other Topics Concern   Not on file  Social History Narrative   Patient drink 4 16oz caffeine  drinks a day    Social Drivers of Corporate Investment Banker Strain: Low Risk  (07/27/2022)   Overall Financial Resource Strain (CARDIA)    Difficulty of Paying Living Expenses: Not hard at all  Food Insecurity: No Food Insecurity (02/18/2023)   Hunger Vital Sign    Worried About Running Out of Food in the Last Year: Never true    Ran Out of Food in the Last Year: Never true  Transportation Needs: No Transportation Needs (02/18/2023)   PRAPARE - Administrator, Civil Service (Medical): No    Lack of Transportation (Non-Medical): No  Physical Activity: Inactive (07/26/2022)   Exercise Vital Sign    Days of Exercise  per Week: 0 days    Minutes of Exercise per Session: 0 min  Stress: No Stress Concern Present (11/19/2022)   [employer] of Occupational Health - Occupational Stress Questionnaire    Feeling of Stress : Not at all  Social Connections: Unknown (08/30/2021)   Received from [facility]   Social Network    Social Network: Not on file  Intimate Partner Violence: Not At Risk (01/25/2024)   Received from [facility]   Humiliation, Afraid, Rape, and Kick questionnaire    Within the last year, have you been afraid of your partner or ex-partner?: No    Within the last year, have you been humiliated or emotionally abused in other ways by your partner or ex-partner?: No    Within the last year, have you been kicked, hit, slapped, or otherwise physically hurt by your partner or ex-partner?: No    Within the last year, have you been raped or forced to have any kind of sexual activity by your partner or ex-partner?: No   Family History  Problem Relation Age of Onset   Diabetes Mother    COPD Mother    Hypertension Mother    Hyperlipidemia Mother    Sleep apnea Mother    Heart disease Father    Hyperlipidemia Father    Hypertension Father    Cancer Father    Sleep apnea Father    Heart disease Brother    Sleep apnea Brother    Heart disease Maternal Grandmother    Hypertension Maternal Grandmother    Sleep apnea Maternal Grandmother    Heart disease Maternal Grandfather    Kidney cancer Paternal Grandmother    Heart disease Paternal Grandfather  Heart disease Daughter       Review of Systems  All other systems reviewed and are negative.      Objective:   Physical Exam Vitals reviewed.  Constitutional:      General: She is not in acute distress.    Appearance: She is well-developed. She is not diaphoretic.  Neck:     Thyroid : No thyromegaly.     Trachea: No tracheal deviation.  Cardiovascular:     Rate and Rhythm: Normal rate and regular rhythm.     Heart sounds:  Normal heart sounds. No murmur heard.    No friction rub. No gallop.  Pulmonary:     Effort: Pulmonary effort is normal. No respiratory distress.     Breath sounds: No stridor. No decreased breath sounds, wheezing, rhonchi or rales.  Chest:     Chest wall: No tenderness.  Neurological:     Mental Status: She is alert.     Motor: No abnormal muscle tone.     Deep Tendon Reflexes: Reflexes are normal and symmetric.       Assessment & Plan:  Benign essential HTN - Plan: CBC with Differential/Platelet, Comprehensive metabolic panel with GFR  Colon cancer screening - Plan: Cologuard  Preop exam for internal medicine I will check a CBC to rule out anemia.  I will check a CMP to rule out any electrolyte disturbances.  Barring any unforeseen abnormalities, I do not see any medical contraindications to surgery if this is necessary.  Patient received her flu shot today along with her shingles vaccine.  I recommended a mammogram that she will have done in February.  She is overdue for colon cancer screening and she elects to proceed with Cologuard which I have ordered for her

## 2024-03-02 ENCOUNTER — Ambulatory Visit: Payer: Self-pay | Admitting: Family Medicine

## 2024-03-02 LAB — COMPREHENSIVE METABOLIC PANEL WITH GFR
AG Ratio: 1.3 (calc) (ref 1.0–2.5)
ALT: 5 U/L — ABNORMAL LOW (ref 6–29)
AST: 8 U/L — ABNORMAL LOW (ref 10–35)
Albumin: 4 g/dL (ref 3.6–5.1)
Alkaline phosphatase (APISO): 76 U/L (ref 37–153)
BUN: 11 mg/dL (ref 7–25)
CO2: 25 mmol/L (ref 20–32)
Calcium: 9.2 mg/dL (ref 8.6–10.4)
Chloride: 101 mmol/L (ref 98–110)
Creat: 0.76 mg/dL (ref 0.50–1.05)
Globulin: 3 g/dL (ref 1.9–3.7)
Glucose, Bld: 94 mg/dL (ref 65–99)
Potassium: 4 mmol/L (ref 3.5–5.3)
Sodium: 137 mmol/L (ref 135–146)
Total Bilirubin: 0.4 mg/dL (ref 0.2–1.2)
Total Protein: 7 g/dL (ref 6.1–8.1)
eGFR: 90 mL/min/1.73m2 (ref >=60)

## 2024-03-02 LAB — CBC WITH DIFFERENTIAL/PLATELET
Absolute Lymphocytes: 2116 {cells}/uL (ref 850–3900)
Absolute Monocytes: 533 {cells}/uL (ref 200–950)
Basophils Absolute: 28 {cells}/uL (ref 0–200)
Basophils Relative: 0.4 %
Eosinophils Absolute: 163 {cells}/uL (ref 15–500)
Eosinophils Relative: 2.3 %
HCT: 36.9 % (ref 35.0–45.0)
Hemoglobin: 12.2 g/dL (ref 11.7–15.5)
MCH: 31.6 pg (ref 27.0–33.0)
MCHC: 33.1 g/dL (ref 32.0–36.0)
MCV: 95.6 fL (ref 80.0–100.0)
MPV: 9.6 fL (ref 7.5–12.5)
Monocytes Relative: 7.5 %
Neutro Abs: 4260 {cells}/uL (ref 1500–7800)
Neutrophils Relative %: 60 %
Platelets: 284 Thousand/uL (ref 140–400)
RBC: 3.86 Million/uL (ref 3.80–5.10)
RDW: 11.9 % (ref 11.0–15.0)
Total Lymphocyte: 29.8 %
WBC: 7.1 Thousand/uL (ref 3.8–10.8)

## 2024-03-07 DIAGNOSIS — Z23 Encounter for immunization: Secondary | ICD-10-CM | POA: Diagnosis not present

## 2024-03-07 NOTE — Addendum Note (Signed)
 Addended by: ANGELENA RONAL BRADLEY K on: 03/07/2024 09:37 AM   Modules accepted: Orders

## 2024-03-21 NOTE — Discharge Summary (Signed)
 ------------------------------------------------------------------------------- Attestation signed by Resa Mt, MD at 03/26/24 2055 I saw and evaluated the patient, participating in the key portions of the service on the day of discharge.  I reviewed the resident's note and agree with the discharge plans and disposition. I personally spent 25 minutes in discharge planning services. Mt Resa, MD  -------------------------------------------------------------------------------  [facility] Physical Medicine and Rehab Discharge Summary  Patient Name: [name] [name]      Medical Record Number: [id number]  Date of Birth: 1966/02/22 Sex: Female         Room/Bed: 3H208/3H208-01 Payor Info: Payor: [employer] MEDICARE ADV / Plan: [employer] DUAL COMPLETE HMO / Product Type: *No Product type* /    Admit Date: 03/13/2024 Discharge Date: 03/26/2024  Admitting Physician: Randine Hadassah Mt, MD Discharge Physician: Mt Resa, M.D. 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 On track with current DC date. Family reports ramp will be built by DC date. Rec: defer therapies until weightbearing restrictions are lifted (pt wants prosthetic), no DME needed (she already has wc and RW) ----------------------------------------------------------------------------------------------------- Team Conference Update  (OT): Patient requiring up to CGA for basic ADLs; on track for d/c; recommending LB dressing AE, and a DABSC in prep for DC. -----------------------------------------------------------------------------------------------------     Indication for Admission / HPI:    Etiologic Diagnosis / Description: [name] [name] is a 58 y/o female with PMHx of BLE PAD, RLE CLTI, WIFI s/p multiple LE interventions including most recently R TMA (02/19/23) that failed to heal, PAD, chronic left hip OA, GERD, HTN, migraines, asthma, myelopathy s/p revision T10-S1 PSF with removal of broken hardware, DDD, seizures, GAD, bipolar disorder, MDD, OSA, obesity (BMI 42), history of substance abuse. [name] [name] was admitted to [facility] on 03/08/2024 for 03/08/2024 for BKA. [name] [name] has participated in acute inpatient physical and occupational therapies to improve functional mobility, activity tolerance, functional strength, balance, and endurance in order to facilitate safe performance of ADLs and daily routines. She is stable and awaiting rehab placement. [name] [name] has been referred to [facility] for continued acute medical management, provision of intensive inpatient therapies, and patient/family training to facilitate safe performance of ADLs and mobility, prior to discharge home.   Hospital Course:   Right BKA  Bilateral peripheral artery disease  Right lower extremity chronic limb threatening ischemia  Wound ischemia and foot infection (WIFI)  R thrombotic microangiography (02/19/2023) Patient had a R BKA (right transtibial amputation) performed by Dr. Debarah on 03/08/2024. Post operative course complicated by pain managed with multimodal pain control. Continued on tylenol , lidocaine  patches, and Lyrica  at discharge. Provided 5 day supply of oxycodone .  Follow up arranged with Dr. Debarah on 03/28/24. Patient will also follow up with Dr. Mt in amputee clinic on 03/29/24.   GERD Continued on  home famotidine  and  pantoprazole .    HTN Continued on home Prazosin . Patient to follow up with primary care provider.   Migraines  Seizures Continued on home regimen of lamotrigine , topiramate , and sumatriptan . Follow up with outpatient neurologist (Dr. Gayland) on 07/17/24.    Chronic left hip OA  Myelopathy s/p revision T10-S1 posterior spinal fusion w/ removal of broken hardware  degenerative disc disease Managed with multimodal pain control. No issues during AIR admission.    GAD  Bipolar disorder  MDD Continued on home regimen of buspirone , duloxetine , Lexapro , haloperidol , and trazodone . Patient to follow up with outpatient psychiatry provider.    Cystitis (treated) UA on 11/25 showed moderate leuk esterase and increased WBC.  Urine culture grew VRSE susceptible to Macrobid . Treated without further issues.    OSA  Asthma No issues during AIR admission. Utilized albuterol  PRN.   Diet at time of discharge: Regular Diet  Consults: None  Procedures: None  Imaging: ECG 12 Lead Result Date: 03/19/2024 NORMAL SINUS RHYTHM NONSPECIFIC ST ABNORMALITY ABNORMAL ECG WHEN COMPARED WITH ECG OF 08-Mar-2024 11:08, NO SIGNIFICANT CHANGE WAS FOUND    Discharge Medications:    Your Medication List     PAUSE taking these medications    estradiol  1 MG tablet Wait to take this until your doctor or other care provider tells you to start again. Commonly known as: ESTRACE  Take 1 tablet (1 mg total) by mouth nightly.       STOP taking these medications    celecoxib  100 MG capsule Commonly known as: CeleBREX    polyethylene glycol 17 gram packet Commonly known as: MIRALAX        START taking these medications    methocarbamol  500 MG tablet Commonly known as: ROBAXIN  Take 1 tablet (500 mg total) by mouth Three (3) times a day.   oxyCODONE  5 MG immediate release tablet Commonly known as: ROXICODONE  Take 1 tablet (5 mg total) by mouth every four (4) hours as needed for up to 7 days.        CONTINUE taking these medications    albuterol  90 mcg/actuation inhaler Commonly known as: PROVENTIL  HFA;VENTOLIN  HFA Inhale 2 puffs every six (6) hours as needed for wheezing.   busPIRone  30 MG tablet Commonly known as: BUSPAR  Take 1 tablet (30 mg total) by mouth two (2) times a day.   CALCIUM  500 + D ORAL Take 1 tablet by mouth Two (2) times a day.   DULoxetine  60 MG capsule Commonly known as: CYMBALTA  Take 1 capsule (60 mg total) by mouth daily.   escitalopram  oxalate 10 MG tablet Commonly known as: LEXAPRO  Take 1 tablet (10 mg total) by mouth daily.   famotidine  40 MG tablet Commonly known as: PEPCID  Take 1 tablet (40 mg total) by mouth nightly.   ferrous sulfate  325 (65 FE) MG tablet Take 1 tablet (325 mg total) by mouth.   fluticasone  propionate 50 mcg/actuation nasal spray Commonly known as: FLONASE  1 spray into each nostril daily.   haloperidol  5 MG tablet Commonly known as: HALDOL  Take 1 tablet (5 mg total) by mouth nightly.   lamoTRIgine  100 MG tablet Commonly known as: LaMICtal  Take 1 tablet (100 mg total) by mouth daily.   levocetirizine 5 MG tablet Commonly known as: XYZAL  Take 1 tablet (5 mg total) by mouth every evening.   multivitamin with minerals tablet Take 1 tablet by mouth daily.   nitroglycerin  0.4 MG SL tablet Commonly known as: NITROSTAT  Place 1 tablet (0.4 mg total) under the tongue  every five (5) minutes as needed for chest pain (States used when hospitalized at [facility] in September 2016, states negative work up).   pantoprazole  40 MG tablet Commonly known as: Protonix  Take 1 tablet (40 mg total) by mouth two (2) times a day.   prazosin  5 MG capsule Commonly known as: MINIPRESS  Take 1 capsule (5 mg total) by mouth nightly.   pregabalin  100 MG capsule Commonly known as: LYRICA  Take 1 capsule (100 mg total) by mouth three (3) times a day.   solifenacin  10 MG tablet Commonly known as: VESICARE  Take 1 tablet (10 mg total) by  mouth daily.   sumatriptan  100 MG tablet Commonly known as: IMITREX  Take 1 tablet (100 mg total) by mouth every two (2) hours as needed for migraine.   topiramate  50 MG tablet Commonly known as: Topamax  Take 1 tablet (50 mg total) by mouth two (2) times a day.   traZODone  100 MG tablet Commonly known as: DESYREL  Take 2 tablets (200 mg total) by mouth nightly.        Significant Diagnostic Studies: Reviewed in Epic  Discharge Instructions:   Medications: Please take all medications as prescribed below and note any changes.  Other Instructions and Information:  Follow Up instructions and Outpatient Referrals    Call MD for:  difficulty breathing, headache or visual disturbances     Call MD for:  persistent nausea or vomiting     Call MD for:  severe uncontrolled pain     Call MD for: Temperature > 38.5 Celsius ( > 101.3 Fahrenheit)     Discharge instructions      Activity Instructions     Activity as tolerated         Follow-Up Appointments:  Please make all follow-up appointments as noted below. If not already scheduled, please set-up an appointment with a Primary Care Provider for continued general medical care. Other Instructions     Call MD for:  difficulty breathing, headache or visual disturbances     Call MD for:  persistent nausea or vomiting     Call MD for:  severe uncontrolled pain     Call MD for: Temperature > 38.5 Celsius ( > 101.3 Fahrenheit)     Discharge instructions     You were hospitalized at [facility] after right below knee amputation. You have made excellent progress during your rehab stay.   DIET: - Regular diet  ACTIVITY: Recovery takes several months, especially if you are elderly or have another illness. Your body uses a lot of energy recovering and needs time and rest. Gradually increase your activity taking rest periods as needed to ensure that you are being safe.   WOUND CARE: Right leg wound site: -  Inspect your wound at least twice daily. - Do not pick at or attempt to remove scabs or staples/sutures yourself. - You may shower, but do not immerse wounds for 2-3 weeks until cleared by the physician. Wash with warm water  and mild soap (such as Dove or Ivory). Do not scrub and pat dry. - Apply an external dressing of dry gauze and paper tape or wrap in Kerlix gauze as needed to keep the incision clean and dry.  WHEN TO CALL YOUR PHYSICIAN: Following discharge from the hospital, please call 911 immediately and go to the nearest Emergency Department if you notice: - Temperature greater than 101.59F or chills - Pain not controlled with prescribed medications - Uncontrolled nausea or vomiting - Worsening or persistent abdominal  pain - Inability to pass stool for 3 days or more  - Severe or worsening headache or acute changes in vision - Weakness or loss of sensation in your arms or legs - Chest pain - Shortness of breath  If you develop these symptoms or if you have trouble obtaining any of your medications, you may also call the [facility] Physical Medicine & Rehabilitation Clinic at [phone] as needed.  You may go to the [facility] Center at [address] in [place] or call the [facility] Link at [phone] for further assistance.  If you develop surgical wound issues such as: - Spreading redness - Purulent discharge - Increasing bleeding or drainage - Separation of incision or sutures - Fever >101.73F - Pain uncontrolled by medications   Call the Vascular Surgery Clinic at [phone] on Mon-Fri from 9am-5pm  or on evenings/weekends you may call the [facility] Operator at [phone] and ask for the Surgery Resident on call.  FOLLOW-UP: Please see the list below of follow-up appointments and make note of the additional providers you will need to see. Please call the provided numbers if you do not receive notice of an appointment date & time.    - Follow-up with PM&R  (amputee clinic) on 03/29/24 with Dr. Deward.   - Follow-up with Vascular surgery on 03/28/24  Please reach out to your Primary Care Provider to schedule hospital follow-up within approximately 1 week of discharge.   [facility] Physical Medicine & Rehabilitation Clinic [facility]  [address] [place], [place] [address]  Phone: [phone] Fax: [phone]      Appointments which have been scheduled for you    Mar 23, 2024 1:00 PM OCCUPATIONAL THERAPY TREATMENT with Donald LITTIE Harden, OT [facility] OCCUPATIONAL THERAPY [facility] REGION) [address] [place] [address]     Mar 25, 2024 8:00 AM OCCUPATIONAL THERAPY TREATMENT with Hadassah Crest, OTA [facility] OCCUPATIONAL THERAPY [facility] REGION) [address] [place] [address]     Mar 25, 2024 10:00 AM PHYSICAL THERAPY TREATMENT with Rosina LOISE Flora, PT [facility] PHYSICAL THERAPY [facility] REGION) [address] [place] [address]     Mar 25, 2024 1:00 PM TREATMENT with Annabella ELINORE Daniels, OT [facility] OCCUPATIONAL THERAPY [facility] REGION) [address] [place] [address]     Mar 28, 2024 8:45 AM (Arrive by 8:30 AM) POST OP with Lang Glean Devonshire, MD [facility] HEART VASCULAR CTR SURGERY [address] [place] [facility] REGION) [address] [place] [address] [phone]     Apr 26, 2024 10:30 AM (Arrive by 10:15 AM) NEW  AMPUTEE with Randine Hadassah Deward, MD [facility] PHYSICAL MEDICINE [facility] [address] [place] [facility] REGION) [phone] [address] [place] [address] [phone]     May 18, 2024 10:00 AM PROSTHETIC NEW EVAL with Fairy Royals, [facility] PROSTHETICS AND ORTHOTICS [facility] [place] [facility] REGION) [address] [place] [address] [phone]        Discharge Day Services: The patient was seen and  examined on the day of discharge. Vitals signs and exam are stable. Therapy goals met. Discharge medications and instructions were discussed with the patient and family, and all questions were answered.  Time spent for discharge: 30 minutes or greater  Physical Exam: Vitals:   03/23/24 0500  BP: 124/79  Pulse: 73  Resp: 16  Temp: 36.4 C (97.5 F)  SpO2: 98%          GEN: sitting up in bed, NAD HEENT: sclera anicteric, MMM, trachea midline CV: warm extremities, well perfused, no cyanosis RESP: NWOB on RA ABD: not distended SKIN: no erythema, edema, or ecchymosis on clothed exam MSK: R BKA with stump shrinker in place NEURO: awake and alert, responds appropriately to questions, moves all extremities  PSYCH: calm, conversationally pleasant  Discharge Condition: Improved  Discharge Disposition: home with family assistance/supervision  Home Health: None  Outpatient Therapy: None; to be determined after weight bearing restrictions lifted   Dictation software was used while making this note. Please excuse any errors made with dictation software and interpret errors as such.

## 2024-03-23 ENCOUNTER — Ambulatory Visit: Admitting: Family Medicine

## 2024-04-06 ENCOUNTER — Other Ambulatory Visit: Payer: Self-pay | Admitting: Family Medicine

## 2024-04-06 ENCOUNTER — Ambulatory Visit: Payer: Self-pay

## 2024-04-06 MED ORDER — PROMETHAZINE HCL 25 MG PO TABS: 25.0000 mg | ORAL_TABLET | Freq: Three times a day (TID) | ORAL | 0 refills | Status: AC | PRN

## 2024-04-06 NOTE — Telephone Encounter (Signed)
 FYI Only or Action Required?: Action required by provider: clinical question for provider and update on patient condition.  Patient was last seen in primary care on 03/01/2024 by [name], MD.  Called Nurse Triage reporting Nausea.  Symptoms began several days ago.  Interventions attempted: Rest, hydration, or home remedies.  Symptoms are: unchanged.  Triage Disposition: See PCP When Office is Open (Within 3 Days)  Patient/caregiver understands and will follow disposition?: No, wishes to speak with PCP  Copied from CRM #[id number]. Topic: Clinical - Red Word Triage >> Apr 06, 2024  1:45 PM Tiffini S wrote: [facility] that prompted transfer to Nurse Triage: started last night with nausea, diarrhea stomach cramps with pains Reason for Disposition  Nausea lasts > 1 week  Answer Assessment - Initial Assessment Questions Patient is scheduled to see Dr. Duanne on Monday. Patient reports moderate to severe nausea that started last night. Patient is asking for nausea medication to be sent to her pharmacy. Requesting a call back from the pharmacy.   1. NAUSEA SEVERITY: How bad is the nausea? (e.g., mild, moderate, severe; dehydration, weight loss)     Moderate-severe 2. ONSET: When did the nausea begin?     Started last night 3. VOMITING: Any vomiting? If Yes, ask: How many times today?     no 4. RECURRENT SYMPTOM: Have you had nausea before? If Yes, ask: When was the last time? What happened that time?     Yes-few month ago 5. CAUSE: What do you think is causing the nausea?     unsure  Protocols used: Nausea-A-AH

## 2024-04-09 ENCOUNTER — Inpatient Hospital Stay: Admitting: Family Medicine

## 2024-04-20 ENCOUNTER — Inpatient Hospital Stay: Admitting: Family Medicine

## 2024-05-02 ENCOUNTER — Other Ambulatory Visit: Payer: Self-pay | Admitting: Family Medicine

## 2024-05-02 DIAGNOSIS — K219 Gastro-esophageal reflux disease without esophagitis: Secondary | ICD-10-CM

## 2024-05-03 NOTE — Telephone Encounter (Signed)
 Requested Prescriptions  Pending Prescriptions Disp Refills   famotidine  (PEPCID ) 40 MG tablet [Pharmacy Med Name: FAMOTIDINE  40 MG TABS 40 Tablet] 90 tablet 1    Sig: TAKE 1 TABLET BY MOUTH EVERY DAY AT BEDTIME     Gastroenterology:  H2 Antagonists Passed - 05/03/2024  4:22 PM      Passed - Valid encounter within last 12 months    Recent Outpatient Visits           2 months ago Benign essential HTN   [facility] [name], MD   6 months ago Hypokalemia   [facility] [name], MD   12 months ago Anemia, unspecified type   [place] [name], MD   1 year ago Anemia, unspecified type   [place] [facility] [name], MD   1 year ago Other acute osteomyelitis of right foot [facility])   [facility] Pickard, Butler DASEN, MD               topiramate  (TOPAMAX ) 50 MG tablet [Pharmacy Med Name: TOPIRAMATE  50 MG TABS 50 Tablet] 180 tablet 1    Sig: TAKE 1 TABLET BY MOUTH TWICE DAILY     Neurology: Anticonvulsants - topiramate  & zonisamide Failed - 05/03/2024  4:22 PM      Failed - ALT in normal range and within 360 days    ALT  Date Value Ref Range Status  03/01/2024 5 (L) 6 - 29 U/L Final         Failed - AST in normal range and within 360 days    AST  Date Value Ref Range Status  03/01/2024 8 (L) 10 - 35 U/L Final         Passed - Cr in normal range and within 360 days    Creat  Date Value Ref Range Status  03/01/2024 0.76 0.50 - 1.05 mg/dL Final   Creatinine, Urine  Date Value Ref Range Status  05/31/2022 203 mg/dL Final    Comment:    Performed at [facility], [address], [place], [place] [address]         Passed - CO2 in normal range and within 360 days    CO2  Date Value Ref Range Status  03/01/2024 25 20 - 32 mmol/L Final   Bicarbonate  Date Value Ref Range Status  07/06/2022 27.2  20.0 - 28.0 mmol/L Final         Passed - Completed PHQ-2 or PHQ-9 in the last 360 days      Passed - Valid encounter within last 12 months    Recent Outpatient Visits           2 months ago Benign essential HTN   [facility] [name], MD   6 months ago Hypokalemia   [place] [facility] Duanne, Butler DASEN, MD   12 months ago Anemia, unspecified type   [place] [facility] [name], MD   1 year ago Anemia, unspecified type   [facility] [name], MD   1 year ago Other acute osteomyelitis of right foot [facility])   [place] [facility] [name], MD               DULoxetine  (CYMBALTA ) 60  MG capsule [Pharmacy Med Name: DULOXETINE  DR 60MG  CAP 60 Capsule] 90 capsule 1    Sig: TAKE 1 CAPSULE BY MOUTH ONCE DAILY     Psychiatry: Antidepressants - SNRI - duloxetine  Passed - 05/03/2024  4:22 PM      Passed - Cr in normal range and within 360 days    Creat  Date Value Ref Range Status  03/01/2024 0.76 0.50 - 1.05 mg/dL Final   Creatinine, Urine  Date Value Ref Range Status  05/31/2022 203 mg/dL Final    Comment:    Performed at [facility], [address] [place], [place] [address]         Passed - eGFR is 30 or above and within 360 days    GFR, Est African American  Date Value Ref Range Status  06/27/2020 119 > OR = 60 mL/min/1.70m2 Final   GFR, Est Non African American  Date Value Ref Range Status  06/27/2020 102 > OR = 60 mL/min/1.33m2 Final   GFR, Estimated  Date Value Ref Range Status  10/11/2023 >60 >60 mL/min Final    Comment:    (NOTE) Calculated using the CKD-EPI Creatinine Equation (2021)    eGFR  Date Value Ref Range Status  03/01/2024 90 > OR = 60 mL/min/1.25m2 Final  12/01/2023 72 >59 mL/min/1.73 Final         Passed - Completed PHQ-2 or PHQ-9 in the last 360 days      Passed - Last BP in  normal range    BP Readings from Last 1 Encounters:  03/01/24 136/76         Passed - Valid encounter within last 6 months    Recent Outpatient Visits           2 months ago Benign essential HTN   [place] [facility] [name], MD   6 months ago Hypokalemia   [place] [facility] Duanne, Butler DASEN, MD   12 months ago Anemia, unspecified type   [place] [facility] [name], MD   1 year ago Anemia, unspecified type   [place] [facility] [name], MD   1 year ago Other acute osteomyelitis of right foot [facility])   [place] [facility] Family Medicine Pickard, Butler DASEN, MD               solifenacin  (VESICARE ) 10 MG tablet [Pharmacy Med Name: SOLIFENACIN  10MG  TAB 10 Tablet] 90 tablet 1    Sig: TAKE 1 TABLET BY MOUTH ONCE DAILY     Urology:  Bladder Agents 2 Failed - 05/03/2024  4:22 PM      Failed - ALT in normal range and within 360 days    ALT  Date Value Ref Range Status  03/01/2024 5 (L) 6 - 29 U/L Final         Failed - AST in normal range and within 360 days    AST  Date Value Ref Range Status  03/01/2024 8 (L) 10 - 35 U/L Final         Passed - Cr in normal range and within 360 days    Creat  Date Value Ref Range Status  03/01/2024 0.76 0.50 - 1.05 mg/dL Final   Creatinine, Urine  Date Value Ref Range Status  05/31/2022 203 mg/dL Final    Comment:    Performed at [facility], [address] [place], [place] [address]  Passed - Valid encounter within last 12 months    Recent Outpatient Visits           2 months ago Benign essential HTN   [place] [facility] Pickard, Butler DASEN, MD   6 months ago Hypokalemia   [facility] Medicine Duanne, Butler DASEN, MD   12 months ago Anemia, unspecified type   [place] [facility] Family Medicine Duanne, Butler DASEN, MD   1 year ago Anemia, unspecified  type   [place] [facility] [name], MD   1 year ago Other acute osteomyelitis of right foot [facility])   [facility] Pickard, Butler DASEN, MD               pantoprazole  (PROTONIX ) 40 MG tablet [Pharmacy Med Name: PANTOPRAZOLE  DR 40MG  TAB 40 Tablet] 180 tablet 1    Sig: TAKE 1 TABLET BY MOUTH TWICE DAILY     Gastroenterology: Proton Pump Inhibitors Passed - 05/03/2024  4:22 PM      Passed - Valid encounter within last 12 months    Recent Outpatient Visits           2 months ago Benign essential HTN   [facility] Duanne, Butler DASEN, MD   6 months ago Hypokalemia   [place] Family Medicine Duanne, Butler DASEN, MD   12 months ago Anemia, unspecified type   [place] [facility] Duanne, Butler DASEN, MD   1 year ago Anemia, unspecified type   [facility] Duanne, Butler DASEN, MD   1 year ago Other acute osteomyelitis of right foot [facility])   [place] [facility] Pickard, Butler DASEN, MD               estradiol  (ESTRACE ) 1 MG tablet [Pharmacy Med Name: ESTRADIOL  1 MG TABS 1 Tablet] 30 tablet 10    Sig: TAKE 1 TABLET BY MOUTH EVERY DAY AT BEDTIME     OB/GYN:  Estrogens Failed - 05/03/2024  4:22 PM      Failed - Mammogram is up-to-date per Health Maintenance      Passed - Last BP in normal range    BP Readings from Last 1 Encounters:  03/01/24 136/76         Passed - Valid encounter within last 12 months    Recent Outpatient Visits           2 months ago Benign essential HTN   [place] [facility] Duanne, Butler DASEN, MD   6 months ago Hypokalemia   [place] [facility] Duanne, Butler DASEN, MD   12 months ago Anemia, unspecified type   [place] [facility] [name], MD   1 year ago Anemia, unspecified type   [place] [facility]  [name], MD   1 year ago Other acute osteomyelitis of right foot [facility])   [place] [facility] Pickard, Butler DASEN, MD

## 2024-05-03 NOTE — Telephone Encounter (Signed)
 Requested medications are due for refill today.  yes  Requested medications are on the active medications list.  yes  Last refill. 06/14/2023 #30 10 rf  Future visit scheduled.   no  Notes to clinic.  No information about mammogram.    Requested Prescriptions  Pending Prescriptions Disp Refills   estradiol  (ESTRACE ) 1 MG tablet [Pharmacy Med Name: ESTRADIOL  1 MG TABS 1 Tablet] 30 tablet 10    Sig: TAKE 1 TABLET BY MOUTH EVERY DAY AT BEDTIME     OB/GYN:  Estrogens Failed - 05/03/2024  4:23 PM      Failed - Mammogram is up-to-date per Health Maintenance      Passed - Last BP in normal range    BP Readings from Last 1 Encounters:  03/01/24 136/76         Passed - Valid encounter within last 12 months    Recent Outpatient Visits           2 months ago Benign essential HTN   [facility] Duanne, Butler DASEN, MD   6 months ago Hypokalemia   [place] [facility] Duanne, Butler DASEN, MD   12 months ago Anemia, unspecified type   [place] [facility] Duanne, Butler DASEN, MD   1 year ago Anemia, unspecified type   [place] [facility] [name], MD   1 year ago Other acute osteomyelitis of right foot [facility])   [facility] Family Medicine Pickard, Butler DASEN, MD              Signed Prescriptions Disp Refills   famotidine  (PEPCID ) 40 MG tablet 90 tablet 1    Sig: TAKE 1 TABLET BY MOUTH EVERY DAY AT BEDTIME     Gastroenterology:  H2 Antagonists Passed - 05/03/2024  4:23 PM      Passed - Valid encounter within last 12 months    Recent Outpatient Visits           2 months ago Benign essential HTN   [place] [facility] [name], MD   6 months ago Hypokalemia   [place] [facility] Duanne, Butler DASEN, MD   12 months ago Anemia, unspecified type   [place] [facility] Medicine [name], MD   1 year ago Anemia,  unspecified type   [place] [facility] [name], MD   1 year ago Other acute osteomyelitis of right foot [facility])    [facility] Family Medicine Pickard, Butler DASEN, MD               topiramate  (TOPAMAX ) 50 MG tablet 180 tablet 1    Sig: TAKE 1 TABLET BY MOUTH TWICE DAILY     Neurology: Anticonvulsants - topiramate  & zonisamide Failed - 05/03/2024  4:23 PM      Failed - ALT in normal range and within 360 days    ALT  Date Value Ref Range Status  03/01/2024 5 (L) 6 - 29 U/L Final         Failed - AST in normal range and within 360 days    AST  Date Value Ref Range Status  03/01/2024 8 (L) 10 - 35 U/L Final         Passed - Cr in normal range and within 360 days    Creat  Date Value Ref Range Status  03/01/2024 0.76 0.50 - 1.05 mg/dL Final   Creatinine, Urine  Date Value Ref Range Status  05/31/2022 203 mg/dL Final    Comment:    Performed at [facility], [address], [place], [place] [address]         Passed - CO2 in normal range and within 360 days    CO2  Date Value Ref Range Status  03/01/2024 25 20 - 32 mmol/L Final   Bicarbonate  Date Value Ref Range Status  07/06/2022 27.2 20.0 - 28.0 mmol/L Final         Passed - Completed PHQ-2 or PHQ-9 in the last 360 days      Passed - Valid encounter within last 12 months    Recent Outpatient Visits           2 months ago Benign essential HTN   [facility] Pickard, Butler DASEN, MD   6 months ago Hypokalemia   [facility] [facility] Medicine Duanne, Butler DASEN, MD   12 months ago Anemia, unspecified type   [place] [facility] Medicine [name], MD   1 year ago Anemia, unspecified type   [place] [facility] Medicine [name], MD   1 year ago Other acute osteomyelitis of right foot [facility])   [place] [facility] Medicine Pickard, Butler DASEN, MD               DULoxetine   (CYMBALTA ) 60 MG capsule 90 capsule 1    Sig: TAKE 1 CAPSULE BY MOUTH ONCE DAILY     Psychiatry: Antidepressants - SNRI - duloxetine  Passed - 05/03/2024  4:23 PM      Passed - Cr in normal range and within 360 days    Creat  Date Value Ref Range Status  03/01/2024 0.76 0.50 - 1.05 mg/dL Final   Creatinine, Urine  Date Value Ref Range Status  05/31/2022 203 mg/dL Final    Comment:    Performed at [facility], [address] [place], [place] [address]         Passed - eGFR is 30 or above and within 360 days    GFR, Est African American  Date Value Ref Range Status  06/27/2020 119 > OR = 60 mL/min/1.11m2 Final   GFR, Est Non African American  Date Value Ref Range Status  06/27/2020 102 > OR = 60 mL/min/1.48m2 Final   GFR, Estimated  Date Value Ref Range Status  10/11/2023 >60 >60 mL/min Final    Comment:    (NOTE) Calculated using the CKD-EPI Creatinine Equation (2021)    eGFR  Date Value Ref Range Status  03/01/2024 90 > OR = 60 mL/min/1.70m2 Final  12/01/2023 72 >59 mL/min/1.73 Final         Passed - Completed PHQ-2 or PHQ-9 in the last 360 days      Passed - Last BP in normal range    BP Readings from Last 1 Encounters:  03/01/24 136/76         Passed - Valid encounter within last 6 months    Recent Outpatient Visits           2 months ago Benign essential HTN   [facility] [name], MD   6 months ago Hypokalemia    [facility] [name], MD   12 months ago Anemia, unspecified type   [facility] Duanne, Butler DASEN, MD   1 year ago Anemia, unspecified type   [facility] [name], MD   1 year ago Other acute osteomyelitis of right foot [facility])   [facility] Family Medicine Pickard, Butler DASEN, MD               solifenacin  (VESICARE ) 10 MG tablet 90 tablet 1    Sig: TAKE 1 TABLET BY MOUTH ONCE  DAILY     Urology:  Bladder Agents 2 Failed - 05/03/2024  4:23 PM      Failed - ALT in normal range and within 360 days    ALT  Date Value Ref Range Status  03/01/2024 5 (L) 6 - 29 U/L Final         Failed - AST in normal range and within 360 days    AST  Date Value Ref Range Status  03/01/2024 8 (L) 10 - 35 U/L Final         Passed - Cr in normal range and within 360 days    Creat  Date Value Ref Range Status  03/01/2024 0.76 0.50 - 1.05 mg/dL Final   Creatinine, Urine  Date Value Ref Range Status  05/31/2022 203 mg/dL Final    Comment:    Performed at [facility], [address] [place], [place] [address]         Passed - Valid encounter within last 12 months    Recent Outpatient Visits           2 months ago Benign essential HTN   [place] [facility] Duanne, Butler DASEN, MD   6 months ago Hypokalemia   [place] [facility] Duanne, Butler DASEN, MD   12 months ago Anemia, unspecified type   [place] [facility] Medicine Duanne, Butler DASEN, MD   1 year ago Anemia, unspecified type   [facility] Medicine Duanne, Butler DASEN, MD   1 year ago Other acute osteomyelitis of right foot [facility])   [place] [facility] Family Medicine Pickard, Butler DASEN, MD               pantoprazole  (PROTONIX ) 40 MG tablet 180 tablet 1    Sig: TAKE 1 TABLET BY MOUTH TWICE DAILY     Gastroenterology: Proton Pump Inhibitors Passed - 05/03/2024  4:23 PM      Passed - Valid encounter within last 12 months    Recent Outpatient Visits           2 months ago Benign essential HTN   [place] [facility] Family Medicine Duanne, Butler DASEN, MD   6 months ago Hypokalemia   [place] [facility] Medicine Duanne, Butler DASEN, MD   12 months ago Anemia, unspecified type   [place] [facility] Family Medicine Duanne, Butler DASEN, MD   1 year ago Anemia, unspecified type   [place] [facility] Family  Medicine [name], MD   1 year ago Other acute osteomyelitis of right foot [facility])   [facility] Family Medicine Pickard, Butler DASEN, MD

## 2024-05-14 ENCOUNTER — Ambulatory Visit: Payer: Self-pay

## 2024-05-14 NOTE — Telephone Encounter (Signed)
 FYI Only or Action Required?: Action required by provider: Pt has a vv appointment for the morning, but is hoping that provider can call in an antibiotic tonight. .  Pt became septic with last UTI.  She has transportation issues and cannot come into the office.  Patient was last seen in primary care on 03/01/2024 by [name], MD.  Called Nurse Triage reporting Dysuria.  Symptoms began yesterday.  Interventions attempted: Nothing.  Symptoms are: gradually worsening.  Triage Disposition: See HCP Within 4 Hours (Or PCP Triage)  Patient/caregiver understands and will follow disposition?: Unsure                          Summary: rx req   Reason for Triage: The patient would like to be contacted by a member of clinical staff to discuss concerns related to a possible infection and medications that could potentially help treat it. Please contact further when possible     Reason for Disposition  Side (flank) or lower back pain present  Protocols used: Urination Pain - Female-A-AH

## 2024-05-15 ENCOUNTER — Encounter: Payer: Self-pay | Admitting: Family Medicine

## 2024-05-15 ENCOUNTER — Telehealth (INDEPENDENT_AMBULATORY_CARE_PROVIDER_SITE_OTHER): Admitting: Family Medicine

## 2024-05-15 DIAGNOSIS — R3989 Other symptoms and signs involving the genitourinary system: Secondary | ICD-10-CM

## 2024-05-15 DIAGNOSIS — R300 Dysuria: Secondary | ICD-10-CM

## 2024-05-15 MED ORDER — NITROFURANTOIN MONOHYD MACRO 100 MG PO CAPS: 100.0000 mg | ORAL_CAPSULE | Freq: Two times a day (BID) | ORAL | 0 refills | Status: AC

## 2024-05-15 NOTE — Progress Notes (Signed)
 "                    MyChart Video Visit    Virtual Visit via Video Note   This format is felt to be most appropriate for this patient at this time. Physical exam was limited by quality of the video and audio technology used for the visit.    Patient location: home Provider location: [place] [facility] Persons involved in the visit: patient, provider Connected at 1005.  I discussed the limitations of evaluation and management by telemedicine and the availability of in person appointments. The patient expressed understanding and agreed to proceed.  Patient: [name] [name]   DOB: 1965/05/23   59 y.o. Female  MRN: [id number] Visit Date: 05/15/2024  Today's healthcare provider: Jeoffrey GORMAN Barrio, FNP   No chief complaint on file.   Subjective:    HPI   Discussed the use of AI scribe software for clinical note transcription with the patient, who gave verbal consent to proceed.  History of Present Illness [name] [name] is a 59 year old female with a history of urinary tract infections who presents with symptoms of a urinary tract infection.  Two days ago, she began experiencing burning and discomfort in the bladder area. The following day, these symptoms worsened, and she noticed a foul odor in her urine, which was so strong it caused her son to vomit.  Currently, she continues to experience bladder pain, burning, and a foul odor in her urine, although the odor is not as severe as before. No fever, chills, body aches, blood in the urine, vaginal discharge, or bleeding. Her urine is described as cloudy but without blood.  She has not been on any antibiotics recently. In the past, she has been treated with Macrobid  for urinary tract infections. She confirms no history of kidney problems.  She has a history of sepsis from urinary tract infections in the past.   Review of Systems  All other systems reviewed and are negative.   Last metabolic panel Lab  Results  Component Value Date   GLUCOSE 94 03/01/2024   NA 137 03/01/2024   K 4.0 03/01/2024   CL 101 03/01/2024   CO2 25 03/01/2024   BUN 11 03/01/2024   CREATININE 0.76 03/01/2024   EGFR 90 03/01/2024   CALCIUM  9.2 03/01/2024   PHOS 9.0 (H) 06/02/2022   PROT 7.0 03/01/2024   ALBUMIN 3.7 10/11/2023   LABGLOB 3.2 06/01/2022   AGRATIO 0.8 06/01/2022   BILITOT 0.4 03/01/2024   ALKPHOS 75 10/11/2023   AST 8 (L) 03/01/2024   ALT 5 (L) 03/01/2024   ANIONGAP 12 10/11/2023         Objective:    There were no vitals taken for this visit.      Physical Exam Constitutional:      General: She is not in acute distress.    Appearance: Normal appearance. She is not toxic-appearing.  Eyes:     Conjunctiva/sclera: Conjunctivae normal.  Pulmonary:     Effort: Pulmonary effort is normal. No respiratory distress.  Skin:    Coloration: Skin is not pale.  Neurological:     General: No focal deficit present.     Mental Status: She is alert and oriented to person, place, and time.  Psychiatric:        Mood and Affect: Mood normal.        Behavior: Behavior normal.  Thought Content: Thought content normal.        Judgment: Judgment normal.         Assessment & Plan:    Problem List Items Addressed This Visit       Other   Suspected UTI - Primary   Other Visit Diagnoses       Dysuria           Assessment and Plan Assessment & Plan Acute urinary tract infection Symptoms consistent with UTI. No fever, chills, or hematuria. History of UTIs and sepsis from UTIs. Able to take Macrobid . - Prescribed Macrobid  100mg  BID for 5 days, sent prescription to pharmacy on [address] in [place]. - Advised to stay well hydrated. - Instructed to seek medical attention if symptoms worsen or persist if fever, chills, body aches, or hematuria develop.    Meds ordered this encounter  Medications   nitrofurantoin , macrocrystal-monohydrate, (MACROBID ) 100 MG capsule     Sig: Take 1 capsule (100 mg total) by mouth 2 (two) times daily.    Dispense:  10 capsule    Refill:  0    Supervising Provider:   DUANNE LOWERS T [[id number]]     Return if symptoms worsen or fail to improve.     I discussed the assessment and treatment plan with the patient. The patient was provided an opportunity to ask questions and all were answered. The patient agreed with the plan and demonstrated an understanding of the instructions.   The patient was advised to call back or seek an in-person evaluation if the symptoms worsen or if the condition fails to improve as anticipated.  I provided 11 minutes of non-face-to-face time during this encounter.  Jeoffrey GORMAN Barrio, FNP [facility]    "

## 2024-05-24 ENCOUNTER — Other Ambulatory Visit: Payer: Self-pay | Admitting: Family Medicine

## 2024-05-24 ENCOUNTER — Ambulatory Visit: Payer: Self-pay | Admitting: Family Medicine

## 2024-05-24 MED ORDER — SULFAMETHOXAZOLE-TRIMETHOPRIM 800-160 MG PO TABS: 1.0000 | ORAL_TABLET | Freq: Two times a day (BID) | ORAL | 0 refills | Status: AC

## 2024-05-24 NOTE — Telephone Encounter (Signed)
" ° °  FYI Only or Action Required?: Action required by provider: medication request.  Patient was last seen in primary care on 05/15/2024 by Kayla Jeoffrey RAMAN, FNP.  Called Nurse Triage reporting Urinary Frequency.  Symptoms began several weeks ago.  Interventions attempted: OTC medications: AZO.  Symptoms are: gradually worsening.  Triage Disposition: See Physician Within 24 Hours  Patient/caregiver understands and will follow disposition?: Yes    Message from [place] R sent at 05/24/2024  8:56 AM EST  Reason for Triage: Patient states she is having burning when urinating and pain in her bladder. Called on 01/27 and got a little better but her symptoms have come back.    Reason for Disposition  Urinating more frequently than usual (i.e., frequency) OR new-onset of the feeling of an urgent need to urinate (i.e., urgency)  Answer Assessment - Initial Assessment Questions 1. SYMPTOM: What's the main symptom you're concerned about? (e.g., frequency, incontinence)     Frequency  2. ONSET: When did the  frequency  start?     X weeks  3. PAIN: Is there any pain? If Yes, ask: How bad is it? (Scale: 1-10; mild, moderate, severe)     Denies  4. CAUSE: What do you think is causing the symptoms?     UTI  5. OTHER SYMPTOMS: Pt denies blood in urine, fever, flank pain, pain with urination          Pt reports UTI s/sx Pt is taking OTC AZO Pt offered visit, however, declined stating her wheelchair ramp is broken and she will not be able to leave her home.  Routing to clinic for assistance with prescribing Bactrim  per pt's request. Pt agrees with plan of care, will call back for any worsening symptoms  Protocols used: Urinary Symptoms-A-AH  "

## 2024-05-25 ENCOUNTER — Ambulatory Visit

## 2024-07-17 ENCOUNTER — Ambulatory Visit: Admitting: Neurology
